# Patient Record
Sex: Female | Born: 1955 | Race: Black or African American | Hispanic: No | Marital: Single | Smoking: Former smoker
Health system: Southern US, Community
[De-identification: ages and names within clinical notes are randomized; demographics above are authoritative.]

## PROBLEM LIST (undated history)

## (undated) DIAGNOSIS — F419 Anxiety disorder, unspecified: Secondary | ICD-10-CM

## (undated) DIAGNOSIS — F329 Major depressive disorder, single episode, unspecified: Secondary | ICD-10-CM

## (undated) DIAGNOSIS — J449 Chronic obstructive pulmonary disease, unspecified: Secondary | ICD-10-CM

## (undated) DIAGNOSIS — M1990 Unspecified osteoarthritis, unspecified site: Secondary | ICD-10-CM

## (undated) DIAGNOSIS — N289 Disorder of kidney and ureter, unspecified: Secondary | ICD-10-CM

## (undated) DIAGNOSIS — IMO0001 Reserved for inherently not codable concepts without codable children: Secondary | ICD-10-CM

## (undated) DIAGNOSIS — G4700 Insomnia, unspecified: Secondary | ICD-10-CM

## (undated) DIAGNOSIS — I719 Aortic aneurysm of unspecified site, without rupture: Secondary | ICD-10-CM

## (undated) DIAGNOSIS — I639 Cerebral infarction, unspecified: Secondary | ICD-10-CM

## (undated) DIAGNOSIS — I10 Essential (primary) hypertension: Secondary | ICD-10-CM

## (undated) DIAGNOSIS — I2781 Cor pulmonale (chronic): Secondary | ICD-10-CM

## (undated) DIAGNOSIS — N189 Chronic kidney disease, unspecified: Secondary | ICD-10-CM

## (undated) DIAGNOSIS — F32A Depression, unspecified: Secondary | ICD-10-CM

## (undated) DIAGNOSIS — E079 Disorder of thyroid, unspecified: Secondary | ICD-10-CM

## (undated) DIAGNOSIS — M109 Gout, unspecified: Secondary | ICD-10-CM

## (undated) DIAGNOSIS — I471 Supraventricular tachycardia, unspecified: Secondary | ICD-10-CM

## (undated) DIAGNOSIS — Q2112 Patent foramen ovale: Secondary | ICD-10-CM

## (undated) DIAGNOSIS — Q211 Atrial septal defect: Secondary | ICD-10-CM

## (undated) HISTORY — DX: Chronic kidney disease, unspecified: N18.9

## (undated) HISTORY — PX: MULTIPLE TOOTH EXTRACTIONS: SHX2053

---

## 1997-10-29 ENCOUNTER — Ambulatory Visit (HOSPITAL_COMMUNITY): Admission: RE | Admit: 1997-10-29 | Discharge: 1997-10-29 | Payer: Self-pay | Admitting: Obstetrics and Gynecology

## 2002-03-11 ENCOUNTER — Emergency Department (HOSPITAL_COMMUNITY): Admission: EM | Admit: 2002-03-11 | Discharge: 2002-03-11 | Payer: Self-pay | Admitting: Emergency Medicine

## 2003-05-21 ENCOUNTER — Emergency Department (HOSPITAL_COMMUNITY): Admission: EM | Admit: 2003-05-21 | Discharge: 2003-05-21 | Payer: Self-pay | Admitting: Emergency Medicine

## 2004-01-06 ENCOUNTER — Emergency Department (HOSPITAL_COMMUNITY): Admission: EM | Admit: 2004-01-06 | Discharge: 2004-01-06 | Payer: Self-pay | Admitting: Emergency Medicine

## 2005-06-04 ENCOUNTER — Emergency Department (HOSPITAL_COMMUNITY): Admission: EM | Admit: 2005-06-04 | Discharge: 2005-06-04 | Payer: Self-pay | Admitting: Emergency Medicine

## 2005-09-12 ENCOUNTER — Emergency Department (HOSPITAL_COMMUNITY): Admission: EM | Admit: 2005-09-12 | Discharge: 2005-09-12 | Payer: Self-pay | Admitting: Emergency Medicine

## 2005-12-21 ENCOUNTER — Emergency Department (HOSPITAL_COMMUNITY): Admission: EM | Admit: 2005-12-21 | Discharge: 2005-12-21 | Payer: Self-pay | Admitting: Family Medicine

## 2007-03-05 ENCOUNTER — Emergency Department (HOSPITAL_COMMUNITY): Admission: EM | Admit: 2007-03-05 | Discharge: 2007-03-06 | Payer: Self-pay | Admitting: Emergency Medicine

## 2007-06-10 ENCOUNTER — Emergency Department (HOSPITAL_COMMUNITY): Admission: EM | Admit: 2007-06-10 | Discharge: 2007-06-10 | Payer: Self-pay | Admitting: Emergency Medicine

## 2009-01-20 ENCOUNTER — Emergency Department (HOSPITAL_COMMUNITY): Admission: EM | Admit: 2009-01-20 | Discharge: 2009-01-20 | Payer: Self-pay | Admitting: Emergency Medicine

## 2009-06-11 ENCOUNTER — Emergency Department (HOSPITAL_COMMUNITY): Admission: EM | Admit: 2009-06-11 | Discharge: 2009-06-11 | Payer: Self-pay | Admitting: Emergency Medicine

## 2010-07-07 LAB — CBC
HCT: 44.6 % (ref 36.0–46.0)
Hemoglobin: 14.7 g/dL (ref 12.0–15.0)
MCHC: 32.9 g/dL (ref 30.0–36.0)
MCV: 84.8 fL (ref 78.0–100.0)
Platelets: 292 10*3/uL (ref 150–400)
RBC: 5.27 MIL/uL — ABNORMAL HIGH (ref 3.87–5.11)
RDW: 17.1 % — ABNORMAL HIGH (ref 11.5–15.5)
WBC: 7.7 10*3/uL (ref 4.0–10.5)

## 2010-07-07 LAB — URINALYSIS, ROUTINE W REFLEX MICROSCOPIC
Glucose, UA: NEGATIVE mg/dL
Ketones, ur: NEGATIVE mg/dL
Leukocytes, UA: NEGATIVE
Nitrite: NEGATIVE
Protein, ur: >300 mg/dL — AB
Specific Gravity, Urine: 1.034 — ABNORMAL HIGH (ref 1.005–1.030)
Urobilinogen, UA: 1 mg/dL (ref 0.0–1.0)
pH: 5 (ref 5.0–8.0)

## 2010-07-07 LAB — BASIC METABOLIC PANEL
BUN: 19 mg/dL (ref 6–23)
CO2: 27 mEq/L (ref 19–32)
Calcium: 9.4 mg/dL (ref 8.4–10.5)
Chloride: 105 mEq/L (ref 96–112)
Creatinine, Ser: 1.68 mg/dL — ABNORMAL HIGH (ref 0.4–1.2)
GFR calc Af Amer: 39 mL/min — ABNORMAL LOW (ref >60)
GFR calc non Af Amer: 32 mL/min — ABNORMAL LOW (ref >60)
Glucose, Bld: 84 mg/dL (ref 70–99)
Potassium: 4.1 mEq/L (ref 3.5–5.1)
Sodium: 141 mEq/L (ref 135–145)

## 2010-07-07 LAB — GLUCOSE, CAPILLARY: Glucose-Capillary: 133 mg/dL — ABNORMAL HIGH (ref 70–99)

## 2010-07-07 LAB — DIFFERENTIAL
Basophils Absolute: 0.1 10*3/uL (ref 0.0–0.1)
Basophils Relative: 1 % (ref 0–1)
Eosinophils Absolute: 0.2 10*3/uL (ref 0.0–0.7)
Eosinophils Relative: 3 % (ref 0–5)
Lymphocytes Relative: 26 % (ref 12–46)
Lymphs Abs: 2 10*3/uL (ref 0.7–4.0)
Monocytes Absolute: 1 10*3/uL (ref 0.1–1.0)
Monocytes Relative: 14 % — ABNORMAL HIGH (ref 3–12)
Neutro Abs: 4.3 10*3/uL (ref 1.7–7.7)
Neutrophils Relative %: 57 % (ref 43–77)

## 2010-07-07 LAB — URINE MICROSCOPIC-ADD ON

## 2010-08-29 ENCOUNTER — Emergency Department (HOSPITAL_COMMUNITY)
Admission: EM | Admit: 2010-08-29 | Discharge: 2010-08-29 | Disposition: A | Discharge status: Home / Self Care | Payer: BC Managed Care – PPO | Attending: Emergency Medicine | Admitting: Emergency Medicine

## 2010-08-29 DIAGNOSIS — G5600 Carpal tunnel syndrome, unspecified upper limb: Secondary | ICD-10-CM | POA: Insufficient documentation

## 2010-08-29 DIAGNOSIS — R209 Unspecified disturbances of skin sensation: Secondary | ICD-10-CM | POA: Insufficient documentation

## 2010-08-29 DIAGNOSIS — I10 Essential (primary) hypertension: Secondary | ICD-10-CM | POA: Insufficient documentation

## 2010-12-26 LAB — CBC
HCT: 42.8
Hemoglobin: 14.5
MCHC: 33.8
MCV: 82.6
Platelets: 339
RBC: 5.19 — ABNORMAL HIGH
RDW: 16.5 — ABNORMAL HIGH
WBC: 8.2

## 2010-12-26 LAB — DIFFERENTIAL
Basophils Absolute: 0
Basophils Relative: 0
Eosinophils Absolute: 0.2
Eosinophils Relative: 2
Lymphocytes Relative: 25
Lymphs Abs: 2.1
Monocytes Absolute: 0.7
Monocytes Relative: 9
Neutro Abs: 5.2
Neutrophils Relative %: 64

## 2010-12-26 LAB — BASIC METABOLIC PANEL
BUN: 11
CO2: 26
Calcium: 9.2
Chloride: 104
Creatinine, Ser: 1.02
GFR calc Af Amer: >60
GFR calc non Af Amer: 57 — ABNORMAL LOW
Glucose, Bld: 113 — ABNORMAL HIGH
Potassium: 4.4
Sodium: 137

## 2011-01-09 LAB — URINE MICROSCOPIC-ADD ON

## 2011-01-09 LAB — URINALYSIS, ROUTINE W REFLEX MICROSCOPIC
Glucose, UA: NEGATIVE
Hgb urine dipstick: NEGATIVE
Ketones, ur: 15 — AB
Nitrite: NEGATIVE
Protein, ur: >300 — AB
Specific Gravity, Urine: 1.026
Urobilinogen, UA: 1
pH: 5

## 2011-01-09 LAB — DIFFERENTIAL
Basophils Absolute: 0
Basophils Relative: 0
Eosinophils Absolute: 0.3
Eosinophils Relative: 4
Lymphocytes Relative: 43
Lymphs Abs: 3.1
Monocytes Absolute: 0.6
Monocytes Relative: 9
Neutro Abs: 3.1
Neutrophils Relative %: 44

## 2011-01-09 LAB — COMPREHENSIVE METABOLIC PANEL
ALT: 12
AST: 17
Albumin: 3.6
Alkaline Phosphatase: 115
BUN: 12
CO2: 21
Calcium: 9.5
Chloride: 107
Creatinine, Ser: 1.21 — ABNORMAL HIGH
GFR calc Af Amer: 57 — ABNORMAL LOW
GFR calc non Af Amer: 47 — ABNORMAL LOW
Glucose, Bld: 114 — ABNORMAL HIGH
Potassium: 3.9
Sodium: 139
Total Bilirubin: 0.6
Total Protein: 7.8

## 2011-01-09 LAB — POCT CARDIAC MARKERS
CKMB, poc: 4.7
Myoglobin, poc: 300
Operator id: 257131
Troponin i, poc: <0.05

## 2011-01-09 LAB — CBC
HCT: 45
Hemoglobin: 14.8
MCHC: 32.9
MCV: 85.8
Platelets: 322
RBC: 5.24 — ABNORMAL HIGH
RDW: 16.1 — ABNORMAL HIGH
WBC: 7.1

## 2011-07-04 ENCOUNTER — Encounter (HOSPITAL_COMMUNITY): Payer: Self-pay | Admitting: *Deleted

## 2011-07-04 ENCOUNTER — Emergency Department (HOSPITAL_COMMUNITY)
Admission: EM | Admit: 2011-07-04 | Discharge: 2011-07-04 | Disposition: A | Discharge status: Home / Self Care | Payer: BC Managed Care – PPO | Attending: Emergency Medicine | Admitting: Emergency Medicine

## 2011-07-04 DIAGNOSIS — R5383 Other fatigue: Secondary | ICD-10-CM | POA: Insufficient documentation

## 2011-07-04 DIAGNOSIS — Z9119 Patient's noncompliance with other medical treatment and regimen: Secondary | ICD-10-CM | POA: Insufficient documentation

## 2011-07-04 DIAGNOSIS — F17200 Nicotine dependence, unspecified, uncomplicated: Secondary | ICD-10-CM | POA: Insufficient documentation

## 2011-07-04 DIAGNOSIS — M7989 Other specified soft tissue disorders: Secondary | ICD-10-CM | POA: Insufficient documentation

## 2011-07-04 DIAGNOSIS — J45909 Unspecified asthma, uncomplicated: Secondary | ICD-10-CM | POA: Insufficient documentation

## 2011-07-04 DIAGNOSIS — Z9114 Patient's other noncompliance with medication regimen: Secondary | ICD-10-CM

## 2011-07-04 DIAGNOSIS — I10 Essential (primary) hypertension: Secondary | ICD-10-CM | POA: Insufficient documentation

## 2011-07-04 DIAGNOSIS — R0602 Shortness of breath: Secondary | ICD-10-CM | POA: Insufficient documentation

## 2011-07-04 DIAGNOSIS — R5381 Other malaise: Secondary | ICD-10-CM | POA: Insufficient documentation

## 2011-07-04 DIAGNOSIS — Z91199 Patient's noncompliance with other medical treatment and regimen due to unspecified reason: Secondary | ICD-10-CM | POA: Insufficient documentation

## 2011-07-04 HISTORY — DX: Essential (primary) hypertension: I10

## 2011-07-04 LAB — BASIC METABOLIC PANEL
BUN: 32 mg/dL — ABNORMAL HIGH (ref 6–23)
CO2: 23 mEq/L (ref 19–32)
Chloride: 105 mEq/L (ref 96–112)
Creatinine, Ser: 2.06 mg/dL — ABNORMAL HIGH (ref 0.50–1.10)
Glucose, Bld: 82 mg/dL (ref 70–99)

## 2011-07-04 LAB — CBC
HCT: 42.7 % (ref 36.0–46.0)
MCH: 27.2 pg (ref 26.0–34.0)
MCV: 84.1 fL (ref 78.0–100.0)
RBC: 5.08 MIL/uL (ref 3.87–5.11)
WBC: 4.9 10*3/uL (ref 4.0–10.5)

## 2011-07-04 MED ORDER — AMLODIPINE BESYLATE 10 MG PO TABS: 10.0000 mg | ORAL_TABLET | Freq: Once | ORAL | Status: DC

## 2011-07-04 MED ORDER — LISINOPRIL 20 MG PO TABS: 20.0000 mg | ORAL_TABLET | Freq: Once | ORAL | Status: DC

## 2011-07-04 MED ORDER — AMLODIPINE BESYLATE 10 MG PO TABS
10.0000 mg | ORAL_TABLET | Freq: Once | ORAL | Status: AC
  Administered 2011-07-04: 10 mg via ORAL
  Filled 2011-07-04: qty 1

## 2011-07-04 MED ORDER — HYDROCHLOROTHIAZIDE 25 MG PO TABS
25.0000 mg | ORAL_TABLET | Freq: Once | ORAL | Status: AC
  Administered 2011-07-04: 25 mg via ORAL
  Filled 2011-07-04: qty 1

## 2011-07-04 MED ORDER — IPRATROPIUM BROMIDE 0.02 % IN SOLN
0.5000 mg | Freq: Once | RESPIRATORY_TRACT | Status: AC
  Administered 2011-07-04: 0.5 mg via RESPIRATORY_TRACT
  Filled 2011-07-04: qty 2.5

## 2011-07-04 MED ORDER — HYDROCHLOROTHIAZIDE 25 MG PO TABS: 25.0000 mg | ORAL_TABLET | Freq: Once | ORAL | Status: DC

## 2011-07-04 MED ORDER — ALBUTEROL SULFATE (5 MG/ML) 0.5% IN NEBU
5.0000 mg | INHALATION_SOLUTION | Freq: Once | RESPIRATORY_TRACT | Status: AC
  Administered 2011-07-04: 5 mg via RESPIRATORY_TRACT
  Filled 2011-07-04: qty 1

## 2011-07-04 MED ORDER — LISINOPRIL 20 MG PO TABS
20.0000 mg | ORAL_TABLET | Freq: Once | ORAL | Status: AC
  Administered 2011-07-04: 20 mg via ORAL
  Filled 2011-07-04: qty 1

## 2011-07-04 NOTE — ED Notes (Signed)
RT called to give breathing treatment.

## 2011-07-04 NOTE — ED Notes (Signed)
Nurse Efraim Kaufmann notified of blood pressure.

## 2011-07-04 NOTE — ED Notes (Signed)
Pt reports has been off of BP medications x couple of months. Reports pain to eyes, denies headache. Reports shortness of breath with exertion and bilateral swelling to hands. Swelling to right elbow. Pt reports feeling "just not right". Denies nausea/vomiting/chest pain. MD Hosmer at bedside.

## 2011-07-04 NOTE — ED Provider Notes (Signed)
3:17 PM Advised close followup with primary care physician. Discussed importance of blood pressure control. Patient voices understanding and is ready for discharge. Blood pressure and swelling have improved slightly since her arrival in emergency department.  Results for orders placed during the hospital encounter of 07/04/11  CBC      Component Value Range   WBC 4.9  4.0 - 10.5 (K/uL)   RBC 5.08  3.87 - 5.11 (MIL/uL)   Hemoglobin 13.8  12.0 - 15.0 (g/dL)   HCT 16.1  09.6 - 04.5 (%)   MCV 84.1  78.0 - 100.0 (fL)   MCH 27.2  26.0 - 34.0 (pg)   MCHC 32.3  30.0 - 36.0 (g/dL)   RDW 40.9 (*) 81.1 - 15.5 (%)   Platelets 313  150 - 400 (K/uL)  BASIC METABOLIC PANEL      Component Value Range   Sodium 141  135 - 145 (mEq/L)   Potassium 4.3  3.5 - 5.1 (mEq/L)   Chloride 105  96 - 112 (mEq/L)   CO2 23  19 - 32 (mEq/L)   Glucose, Bld 82  70 - 99 (mg/dL)   BUN 32 (*) 6 - 23 (mg/dL)   Creatinine, Ser 9.14 (*) 0.50 - 1.10 (mg/dL)   Calcium 9.5  8.4 - 78.2 (mg/dL)   GFR calc non Af Amer 26 (*) >90 (mL/min)   GFR calc Af Amer 30 (*) >90 (mL/min)     Thomasene Lot, PA-C 07/05/11 1959

## 2011-07-04 NOTE — ED Provider Notes (Signed)
History     CSN: [id number]  Arrival date & time 07/04/11  1140   First MD Initiated Contact with Patient 07/04/11 1220      Chief Complaint  Patient presents with  . Hypertension  . Arm Swelling    (Consider location/radiation/quality/duration/timing/severity/associated sxs/prior treatment) HPI Comments: Patient present today with complaints of just not feeling well.  She states she feels more tired than normal.  She has not seen her primary care physician since her primary care physician left the practice several months ago.  Since that time she's not been on her blood pressure medications.  She does not recall specifically what those medications were.  Patient is also noted that she has some swelling in her right distal elbow without cause of trauma or injury.  And she has swelling in both of her hands.  Again with no trauma or injury.  No fevers, redness or warmth.  Patient has no other joint pains in her knees and feet which he takes ibuprofen which does help control her pain.  Family encouraged her to come in for evaluation given the way she's been feeling any concern about her blood pressure.  No chest pain.  Patient does smoke approximately half pack of cigarettes per day and does note some shortness of breath occasionally.  She does not use any inhalers at this time.  Patient is a 56 y.o. female presenting with hypertension. The history is provided by the patient. No language interpreter was used.  Hypertension This is a chronic problem. Associated symptoms include shortness of breath. Pertinent negatives include no chest pain, no abdominal pain and no headaches.    Past Medical History  Diagnosis Date  . Asthma   . Hypertension     History reviewed. No pertinent past surgical history.  History reviewed. No pertinent family history.  History  Substance Use Topics  . Smoking status: Current Everyday Smoker  . Smokeless tobacco: Not on file  . Alcohol Use: Yes    OB  History    Grav Para Term Preterm Abortions TAB SAB Ect Mult Living                  Review of Systems  Constitutional: Positive for fatigue. Negative for fever and chills.  HENT: Negative.   Eyes: Negative.  Negative for discharge and redness.  Respiratory: Positive for shortness of breath. Negative for cough.   Cardiovascular: Negative.  Negative for chest pain.  Gastrointestinal: Negative.  Negative for nausea, vomiting, abdominal pain and diarrhea.  Genitourinary: Negative.  Negative for dysuria and vaginal discharge.  Musculoskeletal: Positive for joint swelling. Negative for back pain.  Skin: Negative.  Negative for color change and rash.  Neurological: Negative.  Negative for syncope and headaches.  Hematological: Negative.  Negative for adenopathy.  Psychiatric/Behavioral: Negative.  Negative for confusion.  All other systems reviewed and are negative.    Allergies  Review of patient's allergies indicates no known allergies.  Home Medications   Current Outpatient Rx  Name Route Sig Dispense Refill  . IBUPROFEN 200 MG PO TABS Oral Take 400-600 mg by mouth every 6 (six) hours as needed. For pain      BP 227/148  Pulse 103  Temp 97.3 F (36.3 C)  Resp 18  SpO2 97%  Physical Exam  Nursing note and vitals reviewed. Constitutional: She is oriented to person, place, and time. She appears well-developed and well-nourished.  Non-toxic appearance. She does not have a sickly appearance.  HENT:  Head:  Normocephalic and atraumatic.  Eyes: Conjunctivae, EOM and lids are normal. Pupils are equal, round, and reactive to light. No scleral icterus.  Neck: Trachea normal and normal range of motion. Neck supple.  Cardiovascular: Normal rate, regular rhythm and normal heart sounds.  Exam reveals no gallop and no friction rub.   No murmur heard. Pulmonary/Chest: Effort normal. No respiratory distress. She has wheezes. She has no rales. She exhibits no tenderness.       Mild  expiratory wheezing.  Abdominal: Soft. Normal appearance. There is no tenderness. There is no rebound, no guarding and no CVA tenderness.  Musculoskeletal: Normal range of motion.       Patient does have swelling in her right elbow that is not erythematous, no warm, nontender.  Patient can flex and extend her elbow without difficulty.  She has palpable radial pulses bilaterally.  Capillary refill less than 2 seconds in both hands.  She does have swelling in both of her hands with the right hand being slightly worse than the left.  Again with no erythema, no warmth and no crepitus.  Neurological: She is alert and oriented to person, place, and time. She has normal strength.  Skin: Skin is warm, dry and intact. No rash noted.  Psychiatric: She has a normal mood and affect. Her behavior is normal. Judgment and thought content normal.    ED Course  Procedures (including critical care time)   Labs Reviewed  CBC  BASIC METABOLIC PANEL   No results found.   No diagnosis found.    MDM  Patient transferred to CDU while awaiting her laboratory studies and receiving her blood pressure medications.  Patient was counseled about the importance of a primary care physician and primary care followup for her blood pressure as well as following her kidney function.        Nat Christen, MD 07/06/11 [phone]

## 2011-07-04 NOTE — ED Notes (Addendum)
Pt states I just don't feel like a human being. Reports is unable to fill her medications due to money. 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 Pt reports she has not taken her blood pressure medication, reports is unable to fill rx. Denies chest pain or sob.

## 2011-07-04 NOTE — ED Notes (Signed)
PT STATES SHE HAS NOT FELT WELL FOR SOME TIME. STATES SHE FEELS TIRED A LOT. STATES SHE HAD BEEN ON BP MEDS BEFORE BUT HAS NOT SEEN A DR IN A WHILE AND HER DR IS NOT PRACTICING IN THE AREA AT THIS TIME. . DENIES CP OR SOB.WILL OBSERVE PT BP FOR IT TO DECREASE. PT AWARE AND AGREEABLE TO PLAN

## 2011-07-04 NOTE — Discharge Instructions (Signed)
Hypertension Information As your heart beats, it forces blood through your arteries. This force is your blood pressure. If the pressure is too high, it is called hypertension (HTN) or high blood pressure. HTN is dangerous because you may have it and not know it. High blood pressure may mean that your heart has to work harder to pump blood. Your arteries may be narrow or stiff. The extra work puts you at risk for heart disease, stroke, and other problems.  Blood pressure consists of two numbers, a higher number over a lower, 110/72, for example. It is stated as "110 over 72." The ideal is below 120 for the top number (systolic) and under 80 for the bottom (diastolic).  You should pay close attention to your blood pressure if you have certain conditions such as:  Heart failure.   Prior heart attack.   Diabetes   Chronic kidney disease.   Prior stroke.   Multiple risk factors for heart disease.  To see if you have HTN, your blood pressure should be measured while you are seated with your arm held at the level of the heart. It should be measured at least twice. A one-time elevated blood pressure reading (especially in the Emergency Department) does not mean that you need treatment. There may be conditions in which the blood pressure is different between your right and left arms. It is important to see your caregiver soon for a recheck. Most people have essential hypertension which means that there is not a specific cause. This type of high blood pressure may be lowered by changing lifestyle factors such as:  Stress.   Smoking.   Lack of exercise.   Excessive weight.   Drug/tobacco/alcohol use.   Eating less salt.  Most people do not have symptoms from high blood pressure until it has caused damage to the body. Effective treatment can often prevent, delay or reduce that damage. TREATMENT  Treatment for high blood pressure, when a cause has been identified, is directed at the cause. There  are a large number of medications to treat HTN. These fall into several categories, and your caregiver will help you select the medicines that are best for you. Medications may have side effects. You should review side effects with your caregiver. If your blood pressure stays high after you have made lifestyle changes or started on medicines,   Your medication(s) may need to be changed.   Other problems may need to be addressed.   Be certain you understand your prescriptions, and know how and when to take your medicine.   Be sure to follow up with your caregiver within the time frame advised (usually within two weeks) to have your blood pressure rechecked and to review your medications.   If you are taking more than one medicine to lower your blood pressure, make sure you know how and at what times they should be taken. Taking two medicines at the same time can result in blood pressure that is too low.  Document Released: 05/23/2005 Document Revised: 11/30/2010 Document Reviewed: 05/30/2007 [employer] Patient Information 2012 [employer]. 

## 2011-07-06 NOTE — ED Provider Notes (Signed)
Medical screening examination/treatment/procedure(s) were conducted as a shared visit with non-physician practitioner(s) and myself.  I personally evaluated the patient during the encounter  See my note with H&P  Nat Christen, MD 07/06/11 [phone]

## 2011-08-27 ENCOUNTER — Encounter (HOSPITAL_COMMUNITY): Payer: Self-pay | Admitting: Family Medicine

## 2011-08-27 ENCOUNTER — Emergency Department (HOSPITAL_COMMUNITY)
Admission: EM | Admit: 2011-08-27 | Discharge: 2011-08-27 | Disposition: A | Discharge status: Home / Self Care | Payer: BC Managed Care – PPO | Attending: Emergency Medicine | Admitting: Emergency Medicine

## 2011-08-27 DIAGNOSIS — I10 Essential (primary) hypertension: Secondary | ICD-10-CM | POA: Insufficient documentation

## 2011-08-27 DIAGNOSIS — J45909 Unspecified asthma, uncomplicated: Secondary | ICD-10-CM | POA: Insufficient documentation

## 2011-08-27 DIAGNOSIS — F17200 Nicotine dependence, unspecified, uncomplicated: Secondary | ICD-10-CM | POA: Insufficient documentation

## 2011-08-27 DIAGNOSIS — Z79899 Other long term (current) drug therapy: Secondary | ICD-10-CM | POA: Insufficient documentation

## 2011-08-27 DIAGNOSIS — Z760 Encounter for issue of repeat prescription: Secondary | ICD-10-CM | POA: Insufficient documentation

## 2011-08-27 MED ORDER — LISINOPRIL 20 MG PO TABS: 20.0000 mg | ORAL_TABLET | Freq: Once | ORAL | Status: DC

## 2011-08-27 MED ORDER — HYDROCHLOROTHIAZIDE 25 MG PO TABS: 25.0000 mg | ORAL_TABLET | Freq: Once | ORAL | Status: DC

## 2011-08-27 MED ORDER — AMLODIPINE BESYLATE 10 MG PO TABS: 10.0000 mg | ORAL_TABLET | Freq: Once | ORAL | Status: DC

## 2011-08-27 NOTE — ED Notes (Signed)
Per pt has been out of BP meds and fluid meds x 1 week.

## 2011-08-27 NOTE — ED Provider Notes (Signed)
History   This chart was scribed for Stephanie Co, MD by Toya Smothers. The patient was seen in room STRE2/STRE2. Patient's care was started at 1338.  CSN: [id number]  Arrival date & time 08/27/11  1338   First MD Initiated Contact with Patient 08/27/11 1508      Chief Complaint  Patient presents with  . Medication Refill   HPI  [name] is a 56 y.o. female who presents to the Emergency Department wanting to refill medication. Pt is currently without a  PCP, and is not showing symptoms denying chest pain, SOB, abdominal pain,, stating that she wanted to refill her medication before it ran out.  Past Medical History  Diagnosis Date  . Asthma   . Hypertension     History reviewed. No pertinent past surgical history.  History reviewed. No pertinent family history.  History  Substance Use Topics  . Smoking status: Current Everyday Smoker  . Smokeless tobacco: Not on file  . Alcohol Use: Yes    Review of Systems  Constitutional: Negative for fever and chills.  Respiratory: Negative for shortness of breath.   Gastrointestinal: Negative for nausea and vomiting.  Neurological: Negative for weakness.  All other systems reviewed and are negative.    Allergies  Review of patient's allergies indicates no known allergies.  Home Medications   Current Outpatient Rx  Name Route Sig Dispense Refill  . AMLODIPINE BESYLATE 10 MG PO TABS Oral Take 1 tablet (10 mg total) by mouth once. 30 tablet 0  . HYDROCHLOROTHIAZIDE 25 MG PO TABS Oral Take 1 tablet (25 mg total) by mouth once. 30 tablet 0  . IBUPROFEN 200 MG PO TABS Oral Take 400-600 mg by mouth every 6 (six) hours as needed. For pain    . LISINOPRIL 20 MG PO TABS Oral Take 1 tablet (20 mg total) by mouth once. 30 tablet 0    BP 168/128  Pulse 108  Temp(Src) 98.6 F (37 C) (Oral)  Resp 21  SpO2 100%  Physical Exam  Nursing note and vitals reviewed. Constitutional: She is oriented to person, place, and time. She  appears well-developed and well-nourished. No distress.  HENT:  Head: Normocephalic and atraumatic.  Eyes: EOM are normal.  Neck: Neck supple. No tracheal deviation present.  Cardiovascular: Normal rate.   Pulmonary/Chest: Effort normal. No respiratory distress.  Musculoskeletal: Normal range of motion.  Neurological: She is alert and oriented to person, place, and time.  Skin: Skin is warm and dry.  Psychiatric: She has a normal mood and affect. Her behavior is normal.    ED Course  Procedures (including critical care time)   COORDINATION OF CARE:    Labs Reviewed - No data to display No results found.   1. Medication refill       MDM  Medication refill.  The patient is without complaints.  She denies chest pain shortness of breath.  She is hypertensive on arrival to the emergency department the blood pressure 160/128.  She's been instructed to fill her blood pressure medications and take them at home.   I personally performed the services described in this documentation, which was scribed in my presence. The recorded information has been reviewed and considered.           Stephanie Co, MD 08/27/11 [phone]

## 2011-08-27 NOTE — Discharge Instructions (Signed)
Medication Refill, Emergency Department  We have refilled your medication today as a courtesy to you. It is best for your medical care, however, to take care of getting refills done through your primary caregiver's office. They have your records and can do a better job of follow-up than we can in the emergency department.  On maintenance medications, we often only prescribe enough medications to get you by until you are able to see your regular caregiver. This is a more expensive way to refill medications.  In the future, please plan for refills so that you will not have to use the emergency department for this.  Thank you for your help. Your help allows us to better take care of the daily emergencies that enter our department.  Document Released: 07/07/2003 Document Revised: 03/09/2011 Document Reviewed: 03/20/2005  [employer] Patient Information 2012 [employer].

## 2012-07-27 ENCOUNTER — Emergency Department (HOSPITAL_COMMUNITY): Payer: BC Managed Care – PPO

## 2012-07-27 ENCOUNTER — Emergency Department (HOSPITAL_COMMUNITY)
Admission: EM | Admit: 2012-07-27 | Discharge: 2012-07-27 | Disposition: A | Discharge status: Home / Self Care | Payer: BC Managed Care – PPO | Attending: Emergency Medicine | Admitting: Emergency Medicine

## 2012-07-27 ENCOUNTER — Encounter (HOSPITAL_COMMUNITY): Payer: Self-pay | Admitting: Emergency Medicine

## 2012-07-27 DIAGNOSIS — Z79899 Other long term (current) drug therapy: Secondary | ICD-10-CM | POA: Insufficient documentation

## 2012-07-27 DIAGNOSIS — I10 Essential (primary) hypertension: Secondary | ICD-10-CM | POA: Insufficient documentation

## 2012-07-27 DIAGNOSIS — M25469 Effusion, unspecified knee: Secondary | ICD-10-CM | POA: Insufficient documentation

## 2012-07-27 DIAGNOSIS — M6580 Other synovitis and tenosynovitis, unspecified site: Secondary | ICD-10-CM | POA: Insufficient documentation

## 2012-07-27 DIAGNOSIS — J45909 Unspecified asthma, uncomplicated: Secondary | ICD-10-CM | POA: Insufficient documentation

## 2012-07-27 DIAGNOSIS — M7652 Patellar tendinitis, left knee: Secondary | ICD-10-CM

## 2012-07-27 DIAGNOSIS — IMO0001 Reserved for inherently not codable concepts without codable children: Secondary | ICD-10-CM | POA: Insufficient documentation

## 2012-07-27 DIAGNOSIS — M7989 Other specified soft tissue disorders: Secondary | ICD-10-CM

## 2012-07-27 DIAGNOSIS — F17200 Nicotine dependence, unspecified, uncomplicated: Secondary | ICD-10-CM | POA: Insufficient documentation

## 2012-07-27 MED ORDER — TRAMADOL HCL 50 MG PO TABS: 50.0000 mg | ORAL_TABLET | Freq: Four times a day (QID) | ORAL | Status: DC | PRN

## 2012-07-27 NOTE — ED Notes (Signed)
Pt reports 1 or 2 year hx of pain and swelling in feet, recurrent swelling in l/knee. Pain in feet and knee when walking Pt stated that she was seen by PCP 1 month ago for same concern

## 2012-07-27 NOTE — ED Provider Notes (Signed)
History     CSN: [id number]  Arrival date & time 07/27/12  1231   First MD Initiated Contact with Patient 07/27/12 1329      Chief Complaint  Patient presents with  . Joint Swelling    l/knee pain and swelling, intermittent  . Foot Pain    recurrent pain swelling in both feet    (Consider location/radiation/quality/duration/timing/severity/associated sxs/prior treatment) HPI [name] is a 57 y.o. female who presents to ED with complaint of pain in left knee. States knee pain started several days ago. States pain with applying pressure and moving knee at the joint. States it is swollen. Denies injuries. States on her feet a lot, but does not remember injuring it. States took ibuprofen. But states she is not supposed to take it bc has kidney problems. States also gets intermittent swelling in ankles and feet bilaterally. States intermittent pain in feet and ankles as well. Denies fever, chills, malaise.  Past Medical History  Diagnosis Date  . Asthma   . Hypertension     History reviewed. No pertinent past surgical history.  Family History  Problem Relation Age of Onset  . Diabetes Mother   . Hypertension Mother     History  Substance Use Topics  . Smoking status: Current Every Day Smoker  . Smokeless tobacco: Not on file  . Alcohol Use: Yes    OB History   Grav Para Term Preterm Abortions TAB SAB Ect Mult Living                  Review of Systems  Constitutional: Negative for fever and chills.  HENT: Negative for neck pain and neck stiffness.   Respiratory: Negative.   Cardiovascular: Positive for leg swelling. Negative for chest pain and palpitations.  Gastrointestinal: Negative for nausea, vomiting and abdominal pain.  Musculoskeletal: Positive for myalgias, joint swelling and arthralgias.  Skin: Negative for color change and wound.  Neurological: Negative for dizziness, weakness, numbness and headaches.    Allergies  Review of patient's allergies  indicates no known allergies.  Home Medications   Current Outpatient Rx  Name  Route  Sig  Dispense  Refill  . amLODipine (NORVASC) 10 MG tablet   Oral   Take 10 mg by mouth daily.         Marland Kitchen ibuprofen (ADVIL,MOTRIN) 200 MG tablet   Oral   Take 400-600 mg by mouth every 6 (six) hours as needed. For pain         . metoprolol tartrate (LOPRESSOR) 25 MG tablet   Oral   Take 25 mg by mouth 2 (two) times daily.           BP 150/99  Pulse 79  Temp(Src) 99 F (37.2 C) (Oral)  SpO2 95%  Physical Exam  Nursing note and vitals reviewed. Constitutional: She is oriented to person, place, and time. She appears well-developed and well-nourished. No distress.  Eyes: Conjunctivae are normal.  Cardiovascular: Normal rate, regular rhythm and normal heart sounds.   Pulmonary/Chest: Effort normal and breath sounds normal. No respiratory distress. She has no wheezes. She has no rales.  Musculoskeletal:  Non pitting edema in bilateral ankles. Normal dorsal pedal pulses. Left knee swelling in the superior patellar area. Pain with flexion and extension of the knee joint. Negative anterior, posterior drawer signs. No valgus or varus. Tender over superior patellar tendon. No redness or warmth to the touch. No calf swelling or tenderness bilaterally. Negative homans' sign.    Neurological:  She is alert and oriented to person, place, and time.  Skin: Skin is warm.    ED Course  Procedures (including critical care time)  Labs Reviewed - No data to display Dg Knee Complete 4 Views Left  07/27/2012  *RADIOLOGY REPORT*  Clinical Data: Pain and swelling in the left knee.  LEFT KNEE - COMPLETE 4+ VIEW  Comparison: No priors.  Findings: Four views of the left knee demonstrate no acute displaced fracture, subluxation or dislocation. Small suprapatellar effusion.  IMPRESSION: 1.  Suprapatellar effusion. 2.  No acute bony abnormality.   Original Report Authenticated By: Trudie Reed, M.D.      1.  Patellar tendonitis, left   2. Swelling of left lower extremity       MDM  Pt with left knee swelling. Tender over superior patella tendon. Tendon is intact. Suprapatellar effusion on x-ray. Possible tendonitis vs bursitis. Will treat with Ice, ultram. Follow up with PCP. No significant swelling on today's foot exam. No signs of DVT. Suspect could be related to pt's known renal insufficiency. Will follow up with pcp.   Filed Vitals:   07/27/12 1321  BP: 150/99  Pulse: 79  Temp: 99 F (37.2 C)  TempSrc: Oral  SpO2: 95%       Lottie Mussel, PA-C 07/27/12 1514

## 2012-07-28 NOTE — ED Provider Notes (Signed)
Medical screening examination/treatment/procedure(s) were performed by non-physician practitioner and as supervising physician I was immediately available for consultation/collaboration.  Merion Grimaldo, MD 07/28/12 0753 

## 2012-09-13 ENCOUNTER — Telehealth: Payer: Self-pay | Admitting: Internal Medicine

## 2012-09-13 NOTE — Telephone Encounter (Signed)
Called pt to schedule np appt.  She has no idea why she needs to come here. I told her to call her Dr to explain the reason. lvm to referring Dr.

## 2012-09-16 ENCOUNTER — Telehealth: Payer: Self-pay | Admitting: Oncology

## 2012-09-16 NOTE — Telephone Encounter (Signed)
C/D 09/16/12 for appt. 10/02/12

## 2012-09-16 NOTE — Telephone Encounter (Signed)
S/W PT IN REF TO NP APPT. 10/02/12@10 :30 REFERRING DR MATTINGLY DX-MONOCLONIAL PROTEIN MAILED NP PACKET

## 2012-09-27 ENCOUNTER — Other Ambulatory Visit: Payer: Self-pay | Admitting: Oncology

## 2012-09-27 DIAGNOSIS — D729 Disorder of white blood cells, unspecified: Secondary | ICD-10-CM

## 2012-10-02 ENCOUNTER — Ambulatory Visit (HOSPITAL_COMMUNITY)
Admission: RE | Admit: 2012-10-02 | Discharge: 2012-10-02 | Disposition: A | Discharge status: Home / Self Care | Payer: BC Managed Care – PPO | Source: Ambulatory Visit | Attending: Oncology | Admitting: Oncology

## 2012-10-02 ENCOUNTER — Other Ambulatory Visit (HOSPITAL_BASED_OUTPATIENT_CLINIC_OR_DEPARTMENT_OTHER): Payer: BC Managed Care – PPO | Admitting: Lab

## 2012-10-02 ENCOUNTER — Encounter: Payer: Self-pay | Admitting: Oncology

## 2012-10-02 ENCOUNTER — Telehealth: Payer: Self-pay | Admitting: Oncology

## 2012-10-02 ENCOUNTER — Ambulatory Visit (HOSPITAL_BASED_OUTPATIENT_CLINIC_OR_DEPARTMENT_OTHER): Payer: BC Managed Care – PPO | Admitting: Oncology

## 2012-10-02 ENCOUNTER — Ambulatory Visit: Payer: BC Managed Care – PPO

## 2012-10-02 VITALS — BP 152/102 | HR 108 | Temp 98.2°F | Resp 20 | Ht 65.0 in | Wt 216.5 lb

## 2012-10-02 DIAGNOSIS — E8809 Other disorders of plasma-protein metabolism, not elsewhere classified: Secondary | ICD-10-CM | POA: Insufficient documentation

## 2012-10-02 DIAGNOSIS — D7289 Other specified disorders of white blood cells: Secondary | ICD-10-CM

## 2012-10-02 DIAGNOSIS — D729 Disorder of white blood cells, unspecified: Secondary | ICD-10-CM

## 2012-10-02 LAB — CBC WITH DIFFERENTIAL/PLATELET
BASO%: 1.5 % (ref 0.0–2.0)
EOS%: 2.9 % (ref 0.0–7.0)
HCT: 40.3 % (ref 34.8–46.6)
LYMPH%: 27.3 % (ref 14.0–49.7)
MCH: 26.8 pg (ref 25.1–34.0)
MCHC: 32.5 g/dL (ref 31.5–36.0)
MCV: 82.6 fL (ref 79.5–101.0)
MONO#: 0.8 10*3/uL (ref 0.1–0.9)
MONO%: 12.2 % (ref 0.0–14.0)
NEUT%: 56.1 % (ref 38.4–76.8)
Platelets: 359 10*3/uL (ref 145–400)

## 2012-10-02 LAB — COMPREHENSIVE METABOLIC PANEL (CC13)
ALT: <6 U/L (ref 0–55)
CO2: 25 mEq/L (ref 22–29)
Creatinine: 2.5 mg/dL — ABNORMAL HIGH (ref 0.6–1.1)
Total Bilirubin: 0.21 mg/dL (ref 0.20–1.20)

## 2012-10-02 NOTE — Progress Notes (Signed)
Reason for Referral: Monoclonal protein.   HPI: [name] is a 57 year old African American woman currently of [place] where she lived the majority of her life. She is a pleasant woman with long-standing history of hypertension as well as arthritis. It appears that she has gout and possible rheumatoid arthritis. It was noted by her primary care physician increased creatinine with a basal ranging between 1.9-2.5. For that reason patient was evaluated at [facility] on 09/03/2012 and laboratory testing was done there. She was noted to have a M spike of 0.3 g/dL and the quantitative immunoglobulins of elevated IgG and IgA was levels of 1823 and 749 respectively. 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 She had a normal CBC with a hemoglobin of 13 her potassium was 5.4 and a creatinine of 2.19. Her albumin was normal at 4.0 so as her calcium. Patient referred to me for an evaluation for a possible plasma cell disorder.  Clinically, she is asymptomatic from this. She does reports pain in her ankle presumably to do gouty arthritis but no other bony pain at this time. She is not reporting any back pain, shoulder pain, hip pain or any neuropathy. She has not reported any recurrent sinopulmonary infections or any opportunistic infections. She's continue to be functional although not working but is looking for a job at this time.   Past Medical History  Diagnosis Date  . Asthma   . Hypertension   :  No surgery is noted.    Current Outpatient Prescriptions  Medication Sig Dispense Refill  . ibuprofen (ADVIL,MOTRIN) 200 MG tablet Take 400-600 mg by mouth every 6 (six) hours as needed. For pain      . metoprolol tartrate (LOPRESSOR) 25 MG tablet Take 25 mg by mouth 2 (two) times daily.      . predniSONE (DELTASONE) 5 MG tablet Take 5 mg by mouth daily.      . traMADol (ULTRAM) 50 MG tablet Take 1 tablet (50 mg total) by mouth every 6 (six)  hours as needed for pain.  15 tablet  0  . amLODipine (NORVASC) 10 MG tablet Take 10 mg by mouth daily.       No current facility-administered medications for this visit.       No Known Allergies:  Family History  Problem Relation Age of Onset  . Diabetes Mother   . Hypertension Mother   :  History   Social History  . Marital Status: Single    Spouse Name: N/A    Number of Children: N/A  . Years of Education: N/A   Occupational History  . Not on file.   Social History Main Topics  . Smoking status: Current Every Day Smoker  . Smokeless tobacco: Not on file  . Alcohol Use: Yes  . Drug Use: Not on file  . Sexually Active: Not on file   Other Topics Concern  . Not on file   Social History Narrative  . No narrative on file  :  A comprehensive review of systems was negative.  Exam: ECOG 0 Blood pressure 152/102, pulse 108, temperature 98.2 F (36.8 C), temperature source Oral, resp. rate 20, height 5\' 5"  (1.651 m), weight 216 lb 8 oz (98.204 kg). General appearance: alert Head: Normocephalic, without obvious abnormality, atraumatic Nose: Nares normal. Septum midline. Mucosa normal. No drainage or sinus tenderness. Throat: lips, mucosa, and tongue normal; teeth and gums normal Neck: no adenopathy, no carotid bruit, no JVD, supple, symmetrical, trachea  midline and thyroid not enlarged, symmetric, no tenderness/mass/nodules Resp: clear to auscultation bilaterally Chest wall: no tenderness Cardio: regular rate and rhythm, S1, S2 normal, no murmur, click, rub or gallop GI: soft, non-tender; bowel sounds normal; no masses,  no organomegaly Extremities: extremities normal, atraumatic, no cyanosis or edema Pulses: 2+ and symmetric Skin: Skin color, texture, turgor normal. No rashes or lesions   Recent Labs  10/02/12 1046  WBC 6.8  HGB 13.1  HCT 40.3  PLT 359   No results found for this basename: NA, K, CL, CO2, GLUCOSE, BUN, CREATININE, CALCIUM,  in the last 72  hours     Assessment and Plan:   57 year old woman with the following issues:  1. 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 There is a kappa light chain restriction based on the immunofixation. The differential diagnosis was discussed today with [name] and her daughter. I would include conditions such as multiple myeloma which I think is less likely, monoclonal gammopathy of undetermined significance, amyloidosis and possibly reactive polyclonal protein to 2 autoimmune arthritis. I don't see clear cut end organ damage at this point her renal insufficiency could be explained by long-standing hypertension rather than myeloma kidney at least for the time being. To complete the workup I will obtain a repeat serum protein electrophoresis I will obtain serum light chains for better quantification also will obtain a skeletal survey. I will also discuss with her the possibility she might need a bone marrow biopsy depending on these findings. Again at this point I feel she is asymptomatic I think her renal function or less likely due to plasma cells. I will ask her to come back in 3 weeks to discuss these results after have completed the workup.  2. Chronically insufficiency: Followed by [facility] Dr. Briant Cedar and believed to be related to long-standing hypertension other than plasma cell disorder but will continue to monitor that as well.

## 2012-10-02 NOTE — Progress Notes (Signed)
Checked in new pt with no financial concerns. °

## 2012-10-02 NOTE — Telephone Encounter (Signed)
Pt sent to [facility] rad for bone survey - s/w rad. Pt given appt schedule for July.

## 2012-10-03 ENCOUNTER — Telehealth: Payer: Self-pay | Admitting: *Deleted

## 2012-10-03 NOTE — Telephone Encounter (Signed)
Message copied by Reesa Chew on Thu Oct 03, 2012 11:34 AM ------      Message from: Benjiman Core      Created: Wed Oct 02, 2012  4:44 PM       Please call her with the results. All normal. ------

## 2012-10-03 NOTE — Telephone Encounter (Signed)
Spoke with daughter, let her know bone survey was normal.

## 2012-10-07 ENCOUNTER — Encounter: Payer: Self-pay | Admitting: *Deleted

## 2012-10-07 LAB — SPEP & IFE WITH QIG
Alpha-2-Globulin: 12.3 % — ABNORMAL HIGH (ref 7.1–11.8)
IgG (Immunoglobin G), Serum: 1450 mg/dL (ref 690–1700)
Total Protein, Serum Electrophoresis: 7.7 g/dL (ref 6.0–8.3)

## 2012-10-07 LAB — KAPPA/LAMBDA LIGHT CHAINS
Kappa free light chain: 8.07 mg/dL — ABNORMAL HIGH (ref 0.33–1.94)
Lambda Free Lght Chn: 6.92 mg/dL — ABNORMAL HIGH (ref 0.57–2.63)

## 2012-10-07 NOTE — Progress Notes (Addendum)
CSW received referral from [place], [place], for emotional support.  CSW attempted to contact patient, unable to reach patient by phone.  CSW will attempt to follow up at another time.  UPDATE 1530: Patient returned CSW phone call and shared she is feeling overwhelmed since recently losing her job.  CSW and patient discussed ways of coping with feeling overwhelmed.  CSW encouraged patient to utilize [facility] counseling services.  CSW also provided patient with information on [employer] which has programs to help with employment, legal issues, resources, etc.   Kathrin Penner, MSW, LCSW Clinical Social Worker [facility] [phone]

## 2012-10-23 ENCOUNTER — Ambulatory Visit: Payer: BC Managed Care – PPO | Admitting: Oncology

## 2013-02-21 ENCOUNTER — Emergency Department (HOSPITAL_COMMUNITY): Payer: Self-pay

## 2013-02-21 ENCOUNTER — Emergency Department (HOSPITAL_COMMUNITY)
Admission: EM | Admit: 2013-02-21 | Discharge: 2013-02-21 | Disposition: A | Discharge status: Home / Self Care | Payer: Self-pay | Attending: Emergency Medicine | Admitting: Emergency Medicine

## 2013-02-21 ENCOUNTER — Encounter (HOSPITAL_COMMUNITY): Payer: Self-pay | Admitting: Emergency Medicine

## 2013-02-21 DIAGNOSIS — M129 Arthropathy, unspecified: Secondary | ICD-10-CM | POA: Insufficient documentation

## 2013-02-21 DIAGNOSIS — Z79899 Other long term (current) drug therapy: Secondary | ICD-10-CM | POA: Insufficient documentation

## 2013-02-21 DIAGNOSIS — J45909 Unspecified asthma, uncomplicated: Secondary | ICD-10-CM | POA: Insufficient documentation

## 2013-02-21 DIAGNOSIS — Z7982 Long term (current) use of aspirin: Secondary | ICD-10-CM | POA: Insufficient documentation

## 2013-02-21 DIAGNOSIS — G4700 Insomnia, unspecified: Secondary | ICD-10-CM | POA: Insufficient documentation

## 2013-02-21 DIAGNOSIS — M79609 Pain in unspecified limb: Secondary | ICD-10-CM | POA: Insufficient documentation

## 2013-02-21 DIAGNOSIS — Z8639 Personal history of other endocrine, nutritional and metabolic disease: Secondary | ICD-10-CM | POA: Insufficient documentation

## 2013-02-21 DIAGNOSIS — I10 Essential (primary) hypertension: Secondary | ICD-10-CM | POA: Insufficient documentation

## 2013-02-21 DIAGNOSIS — F17200 Nicotine dependence, unspecified, uncomplicated: Secondary | ICD-10-CM | POA: Insufficient documentation

## 2013-02-21 DIAGNOSIS — Z862 Personal history of diseases of the blood and blood-forming organs and certain disorders involving the immune mechanism: Secondary | ICD-10-CM | POA: Insufficient documentation

## 2013-02-21 HISTORY — DX: Insomnia, unspecified: G47.00

## 2013-02-21 HISTORY — DX: Gout, unspecified: M10.9

## 2013-02-21 HISTORY — DX: Unspecified osteoarthritis, unspecified site: M19.90

## 2013-02-21 LAB — POCT I-STAT, CHEM 8
Calcium, Ion: 1.23 mmol/L (ref 1.12–1.23)
Glucose, Bld: 76 mg/dL (ref 70–99)
HCT: 42 % (ref 36.0–46.0)
Hemoglobin: 14.3 g/dL (ref 12.0–15.0)
Potassium: 4.2 mEq/L (ref 3.5–5.1)

## 2013-02-21 NOTE — ED Notes (Signed)
Pt sent here from [facility].  Pt having high blood pressure, nose bleeds and sore hands. Pt has PMH gout and arthritis. Pt states that she has had nose bleeds everyday this week but today. Pt BP at [facility] was 173/134, 160/135 and 152/120. Pt was given clonidine 0.1mg  in office with BP down to 150/120 dose repeated with BP 160/135, pt then advised to come to ED for further eval and mgt.

## 2013-02-21 NOTE — ED Provider Notes (Signed)
CSN: [id number]     Arrival date & time 02/21/13  1835 History   First MD Initiated Contact with Patient 02/21/13 2012     Chief Complaint  Patient presents with  . Hypertension  . sore hands    (Consider location/radiation/quality/duration/timing/severity/associated sxs/prior Treatment) HPI Comments: Patient presents emergency department with chief complaint of hypertension. She was seen by her family doctor today, and was found to be hypertensive to 173/134. She was sent to the emergency department for further evaluation. She denies any headache, dizziness, chest pain, shortness of breath, nausea, vomiting, diarrhea, or constipation. She states that she is a little sore from where her gout is acting up, but is otherwise in no apparent distress. She states that her blood pressure is up because she has been running around busy all day today. She takes clonidine for her blood pressure as well as losartan/hydrochlorothiazide.  The history is provided by the patient. No language interpreter was used.    Past Medical History  Diagnosis Date  . Asthma   . Hypertension   . Gout   . Arthritis   . Insomnia    History reviewed. No pertinent past surgical history. Family History  Problem Relation Age of Onset  . Diabetes Mother   . Hypertension Mother    History  Substance Use Topics  . Smoking status: Current Every Day Smoker    Types: Cigarettes  . Smokeless tobacco: Not on file  . Alcohol Use: Yes   OB History   Grav Para Term Preterm Abortions TAB SAB Ect Mult Living                 Review of Systems  All other systems reviewed and are negative.    Allergies  Review of patient's allergies indicates no known allergies.  Home Medications   Current Outpatient Rx  Name  Route  Sig  Dispense  Refill  . aspirin 75 MG chewable tablet   Oral   Chew 75 mg by mouth daily.         Marland Kitchen ibuprofen (ADVIL,MOTRIN) 200 MG tablet   Oral   Take 400-600 mg by mouth every 6 (six)  hours as needed. For pain         . losartan-hydrochlorothiazide (HYZAAR) 100-25 MG per tablet   Oral   Take 1 tablet by mouth daily.         . traZODone (DESYREL) 50 MG tablet   Oral   Take 50 mg by mouth at bedtime as needed for sleep (sleep).         . triamcinolone cream (KENALOG) 0.1 %   Topical   Apply 1 application topically 2 (two) times daily.         Marland Kitchen EXPIRED: amLODipine (NORVASC) 10 MG tablet   Oral   Take 10 mg by mouth daily.          BP 144/99  Pulse 100  Temp(Src) 98.2 F (36.8 C) (Oral)  Resp 20  SpO2 93% Physical Exam  Nursing note and vitals reviewed. Constitutional: She is oriented to person, place, and time. She appears well-developed and well-nourished.  HENT:  Head: Normocephalic and atraumatic.  Eyes: Conjunctivae and EOM are normal. Pupils are equal, round, and reactive to light.  Neck: Normal range of motion. Neck supple.  Cardiovascular: Normal rate and regular rhythm.  Exam reveals no gallop and no friction rub.   No murmur heard. Pulmonary/Chest: Effort normal and breath sounds normal. No respiratory distress. She has no  wheezes. She has no rales. She exhibits no tenderness.  Abdominal: Soft. Bowel sounds are normal. She exhibits no distension and no mass. There is no tenderness. There is no rebound and no guarding.  Musculoskeletal: Normal range of motion. She exhibits no edema and no tenderness.  Neurological: She is alert and oriented to person, place, and time.  Skin: Skin is warm and dry.  Psychiatric: She has a normal mood and affect. Her behavior is normal. Judgment and thought content normal.    ED Course  Procedures (including critical care time) Results for orders placed during the hospital encounter of 02/21/13  POCT I-STAT, CHEM 8      Result Value Range   Sodium 140  135 - 145 mEq/L   Potassium 4.2  3.5 - 5.1 mEq/L   Chloride 108  96 - 112 mEq/L   BUN 37 (*) 6 - 23 mg/dL   Creatinine, Ser 1.61 (*) 0.50 - 1.10 mg/dL    Glucose, Bld 76  70 - 99 mg/dL   Calcium, Ion 0.96  0.45 - 1.23 mmol/L   TCO2 22  0 - 100 mmol/L   Hemoglobin 14.3  12.0 - 15.0 g/dL   HCT 40.9  81.1 - 91.4 %   Dg Chest 2 View  02/21/2013   CLINICAL DATA:  Uncontrolled high blood pressure  EXAM: CHEST  2 VIEW  COMPARISON:  [facility] 16, 2014  FINDINGS: The heart size and mediastinal contours are stable. There is mild cardiomegaly. Both lungs are clear. The visualized skeletal structures are stable.  IMPRESSION: No active cardiopulmonary disease.   Electronically Signed   By: Sherian Rein M.D.   On: 02/21/2013 21:18    Imaging Review Dg Chest 2 View  02/21/2013   CLINICAL DATA:  Uncontrolled high blood pressure  EXAM: CHEST  2 VIEW  COMPARISON:  [facility]tha 16, 2014  FINDINGS: The heart size and mediastinal contours are stable. There is mild cardiomegaly. Both lungs are clear. The visualized skeletal structures are stable.  IMPRESSION: No active cardiopulmonary disease.   Electronically Signed   By: Sherian Rein M.D.   On: 02/21/2013 21:18    EKG Interpretation   None       MDM   1. Hypertension      10:05 PM Patient discussed with Dr. Effie Shy.  No further intervention at this time.  PCP follow-up.  Patient is asymptomatic.  No headaches, dizziness, chest pain, or vision changes. No signs of end organ damage. Does have slight bump in Cr which was discussed with Dr. Effie Shy, recommend f/u.  Patient is stable and ready for discharge.   Filed Vitals:   02/21/13 2143  BP: 143/100  Pulse:   Temp:   Resp:      Roxy Horseman, PA-C 02/21/13 2325

## 2013-02-22 NOTE — ED Provider Notes (Signed)
Medical screening examination/treatment/procedure(s) were performed by non-physician practitioner and as supervising physician I was immediately available for consultation/collaboration.  EKG Interpretation   None        Flint Melter, MD 02/22/13 [phone]

## 2013-04-18 ENCOUNTER — Emergency Department (HOSPITAL_COMMUNITY): Payer: No Typology Code available for payment source

## 2013-04-18 ENCOUNTER — Emergency Department (HOSPITAL_COMMUNITY)
Admission: EM | Admit: 2013-04-18 | Discharge: 2013-04-18 | Disposition: A | Discharge status: Home / Self Care | Payer: No Typology Code available for payment source | Attending: Emergency Medicine | Admitting: Emergency Medicine

## 2013-04-18 ENCOUNTER — Encounter (HOSPITAL_COMMUNITY): Payer: Self-pay | Admitting: Emergency Medicine

## 2013-04-18 DIAGNOSIS — G479 Sleep disorder, unspecified: Secondary | ICD-10-CM | POA: Insufficient documentation

## 2013-04-18 DIAGNOSIS — M19039 Primary osteoarthritis, unspecified wrist: Secondary | ICD-10-CM | POA: Insufficient documentation

## 2013-04-18 DIAGNOSIS — IMO0002 Reserved for concepts with insufficient information to code with codable children: Secondary | ICD-10-CM | POA: Insufficient documentation

## 2013-04-18 DIAGNOSIS — F17200 Nicotine dependence, unspecified, uncomplicated: Secondary | ICD-10-CM | POA: Insufficient documentation

## 2013-04-18 DIAGNOSIS — R21 Rash and other nonspecific skin eruption: Secondary | ICD-10-CM | POA: Insufficient documentation

## 2013-04-18 DIAGNOSIS — M19049 Primary osteoarthritis, unspecified hand: Secondary | ICD-10-CM | POA: Insufficient documentation

## 2013-04-18 DIAGNOSIS — Z79899 Other long term (current) drug therapy: Secondary | ICD-10-CM | POA: Insufficient documentation

## 2013-04-18 DIAGNOSIS — Z862 Personal history of diseases of the blood and blood-forming organs and certain disorders involving the immune mechanism: Secondary | ICD-10-CM | POA: Insufficient documentation

## 2013-04-18 DIAGNOSIS — I10 Essential (primary) hypertension: Secondary | ICD-10-CM | POA: Insufficient documentation

## 2013-04-18 DIAGNOSIS — Z8639 Personal history of other endocrine, nutritional and metabolic disease: Secondary | ICD-10-CM | POA: Insufficient documentation

## 2013-04-18 DIAGNOSIS — F39 Unspecified mood [affective] disorder: Secondary | ICD-10-CM | POA: Insufficient documentation

## 2013-04-18 DIAGNOSIS — Z7982 Long term (current) use of aspirin: Secondary | ICD-10-CM | POA: Insufficient documentation

## 2013-04-18 DIAGNOSIS — J45909 Unspecified asthma, uncomplicated: Secondary | ICD-10-CM | POA: Insufficient documentation

## 2013-04-18 LAB — CBC WITH DIFFERENTIAL/PLATELET
Basophils Absolute: 0 10*3/uL (ref 0.0–0.1)
Basophils Relative: 0 % (ref 0–1)
EOS ABS: 0.3 10*3/uL (ref 0.0–0.7)
Eosinophils Relative: 5 % (ref 0–5)
HEMATOCRIT: 40 % (ref 36.0–46.0)
Hemoglobin: 12.8 g/dL (ref 12.0–15.0)
LYMPHS PCT: 33 % (ref 12–46)
Lymphs Abs: 1.8 10*3/uL (ref 0.7–4.0)
MCH: 27.5 pg (ref 26.0–34.0)
MCHC: 32 g/dL (ref 30.0–36.0)
MCV: 86 fL (ref 78.0–100.0)
MONO ABS: 0.6 10*3/uL (ref 0.1–1.0)
Monocytes Relative: 11 % (ref 3–12)
Neutro Abs: 2.7 10*3/uL (ref 1.7–7.7)
Neutrophils Relative %: 50 % (ref 43–77)
PLATELETS: 340 10*3/uL (ref 150–400)
RBC: 4.65 MIL/uL (ref 3.87–5.11)
RDW: 16.4 % — AB (ref 11.5–15.5)
WBC: 5.5 10*3/uL (ref 4.0–10.5)

## 2013-04-18 LAB — TROPONIN I: Troponin I: <0.3 ng/mL (ref <0.30)

## 2013-04-18 LAB — BASIC METABOLIC PANEL
BUN: 33 mg/dL — AB (ref 6–23)
CHLORIDE: 103 meq/L (ref 96–112)
CO2: 20 meq/L (ref 19–32)
Calcium: 9.3 mg/dL (ref 8.4–10.5)
Creatinine, Ser: 2.64 mg/dL — ABNORMAL HIGH (ref 0.50–1.10)
GFR calc Af Amer: 22 mL/min — ABNORMAL LOW (ref >90)
GFR calc non Af Amer: 19 mL/min — ABNORMAL LOW (ref >90)
GLUCOSE: 85 mg/dL (ref 70–99)
POTASSIUM: 4.8 meq/L (ref 3.7–5.3)
SODIUM: 139 meq/L (ref 137–147)

## 2013-04-18 MED ORDER — PREDNISONE 20 MG PO TABS
60.0000 mg | ORAL_TABLET | Freq: Once | ORAL | Status: AC
  Administered 2013-04-18: 60 mg via ORAL
  Filled 2013-04-18: qty 3

## 2013-04-18 MED ORDER — PREDNISONE 20 MG PO TABS: 60.0000 mg | ORAL_TABLET | Freq: Every day | ORAL | Status: DC

## 2013-04-18 NOTE — ED Notes (Addendum)
[facility] with HTN, weakness and left arm numbness X 1 week. B?P at facility was 240/150. Pt was given 100 mg of labetol po at facility. Pt states ROM is baseline. Nuero intact

## 2013-04-18 NOTE — ED Provider Notes (Signed)
CSN: [id number]     Arrival date & time 04/18/13  1840 History   First MD Initiated Contact with Patient 04/18/13 2003     Chief Complaint  Patient presents with  . Hypertension   (Consider location/radiation/quality/duration/timing/severity/associated sxs/prior Treatment) Patient is a 58 y.o. female presenting with hypertension. The history is provided by the patient and a relative. No language interpreter was used.  Hypertension Pertinent negatives include no abdominal pain, chest pain, chills, fever or vomiting. Associated symptoms comments: The patient presents from [facility] with significant hypertension found on clinic visit this afternoon. The patient also complains of left arm soreness but reports that she has chronic similar symptoms secondary to arthritis in the left arm and hand. She denies chest pain, shortness of breath, nausea. When asked if she was taking her medications as prescribed for blood pressure, she states she has been depressed lately and "I don't want to do anything for myself" so she doesn't take her medications daily. .    Past Medical History  Diagnosis Date  . Asthma   . Hypertension   . Gout   . Arthritis   . Insomnia    History reviewed. No pertinent past surgical history. Family History  Problem Relation Age of Onset  . Diabetes Mother   . Hypertension Mother    History  Substance Use Topics  . Smoking status: Current Every Day Smoker    Types: Cigarettes  . Smokeless tobacco: Not on file  . Alcohol Use: Yes   OB History   Grav Para Term Preterm Abortions TAB SAB Ect Mult Living                 Review of Systems  Constitutional: Negative for fever and chills.  HENT: Negative.   Respiratory: Negative.  Negative for shortness of breath.   Cardiovascular: Negative.  Negative for chest pain.  Gastrointestinal: Negative.  Negative for vomiting and abdominal pain.  Musculoskeletal:       See HPI.  Skin: Negative.    Neurological: Negative.   Psychiatric/Behavioral: Positive for dysphoric mood. Negative for suicidal ideas.    Allergies  Review of patient's allergies indicates no known allergies.  Home Medications   Current Outpatient Rx  Name  Route  Sig  Dispense  Refill  . aspirin EC 81 MG tablet   Oral   Take 81 mg by mouth daily.         Marland Kitchen. losartan-hydrochlorothiazide (HYZAAR) 100-25 MG per tablet   Oral   Take 1 tablet by mouth daily.         . traZODone (DESYREL) 50 MG tablet   Oral   Take 50 mg by mouth at bedtime as needed for sleep (sleep).         . triamcinolone cream (KENALOG) 0.1 %   Topical   Apply 1 application topically 2 (two) times daily.         Marland Kitchen. EXPIRED: amLODipine (NORVASC) 10 MG tablet   Oral   Take 10 mg by mouth daily.          BP 159/104  Pulse 88  Temp(Src) 99.2 F (37.3 C) (Oral)  Resp 24  SpO2 98% Physical Exam  Constitutional: She is oriented to person, place, and time. She appears well-developed and well-nourished.  HENT:  Head: Normocephalic.  Neck: Normal range of motion. Neck supple.  Cardiovascular: Normal rate and regular rhythm.   Pulmonary/Chest: Effort normal and breath sounds normal.  Abdominal: Soft. Bowel sounds  are normal. There is no tenderness. There is no rebound and no guarding.  Musculoskeletal: Normal range of motion. She exhibits no edema.  Neurological: She is alert and oriented to person, place, and time.  Skin: Skin is warm and dry. No rash noted.  Psychiatric: She has a normal mood and affect.    ED Course  Procedures (including critical care time) Labs Review Labs Reviewed  BASIC METABOLIC PANEL - Abnormal; Notable for the following:    BUN 33 (*)    Creatinine, Ser 2.64 (*)    GFR calc non Af Amer 19 (*)    GFR calc Af Amer 22 (*)    All other components within normal limits  CBC WITH DIFFERENTIAL - Abnormal; Notable for the following:    RDW 16.4 (*)    All other components within normal limits   TROPONIN I   Results for orders placed during the hospital encounter of 04/18/13  BASIC METABOLIC PANEL      Result Value Range   Sodium 139  137 - 147 mEq/L   Potassium 4.8  3.7 - 5.3 mEq/L   Chloride 103  96 - 112 mEq/L   CO2 20  19 - 32 mEq/L   Glucose, Bld 85  70 - 99 mg/dL   BUN 33 (*) 6 - 23 mg/dL   Creatinine, Ser 1.61 (*) 0.50 - 1.10 mg/dL   Calcium 9.3  8.4 - 09.6 mg/dL   GFR calc non Af Amer 19 (*) >90 mL/min   GFR calc Af Amer 22 (*) >90 mL/min  TROPONIN I      Result Value Range   Troponin I <0.30  <0.30 ng/mL  CBC WITH DIFFERENTIAL      Result Value Range   WBC 5.5  4.0 - 10.5 K/uL   RBC 4.65  3.87 - 5.11 MIL/uL   Hemoglobin 12.8  12.0 - 15.0 g/dL   HCT 04.5  40.9 - 81.1 %   MCV 86.0  78.0 - 100.0 fL   MCH 27.5  26.0 - 34.0 pg   MCHC 32.0  30.0 - 36.0 g/dL   RDW 91.4 (*) 78.2 - 95.6 %   Platelets 340  150 - 400 K/uL   Neutrophils Relative % 50  43 - 77 %   Neutro Abs 2.7  1.7 - 7.7 K/uL   Lymphocytes Relative 33  12 - 46 %   Lymphs Abs 1.8  0.7 - 4.0 K/uL   Monocytes Relative 11  3 - 12 %   Monocytes Absolute 0.6  0.1 - 1.0 K/uL   Eosinophils Relative 5  0 - 5 %   Eosinophils Absolute 0.3  0.0 - 0.7 K/uL   Basophils Relative 0  0 - 1 %   Basophils Absolute 0.0  0.0 - 0.1 K/uL    Imaging Review Dg Chest Portable 1 View  04/18/2013   CLINICAL DATA:  Blurry vision.  Hypertension.  EXAM: PORTABLE CHEST - 1 VIEW  COMPARISON:  Chest x-ray 02/21/2013.  FINDINGS: Lung volumes are normal. No consolidative airspace disease. No pleural effusions. No evidence of pulmonary edema. Heart size is borderline enlarged. Upper mediastinal contours are within normal limits.  IMPRESSION: 1. No radiographic evidence of acute cardiopulmonary disease.   Electronically Signed   By: Trudie Reed M.D.   On: 04/18/2013 21:29    EKG Interpretation   None       MDM  No diagnosis found. 1. Hypertension  The patient is not having chest pain,  SOB or nausea. She reports her  left arm symptoms are per her usual longstanding symptoms of diagnosed arthritis. Family agrees. Blood pressure improved after Labetolol given prior to arrival here. Labs (Cr at baseline), x-ray, EKG reassuring. Feel she is stable for discharge.   She asks about a facial rash that burns and itches, that comes and goes and that causes burning to eyes bilaterally. No fever, purulent drainage from eyes. Exam shows a dry, scaling rash surrounding both eyes, including lids. No redness. She reports a cream given by her doctor is not effective but does not know the name of the cream. Suggested a short course of oral steroid and follow up with primary care physician.  Arnoldo Hooker, PA-C 04/18/13 2307  Arnoldo Hooker, PA-C 04/18/13 2309

## 2013-04-18 NOTE — ED Notes (Signed)
PA at [facility].  

## 2013-04-18 NOTE — Discharge Instructions (Signed)
IT IS VERY IMPORTANT FOR YOU TO TAKE YOUR MEDICATIONS AS PRESCRIBED FOR YOUR BLOOD PRESSURE.    Arterial Hypertension Arterial hypertension (high blood pressure) is a condition of elevated pressure in your blood vessels. Hypertension over a long period of time is a risk factor for strokes, heart attacks, and heart failure. It is also the leading cause of kidney (renal) failure.  CAUSES   In Adults -- Over 90% of all hypertension has no known cause. This is called essential or primary hypertension. In the other 10% of people with hypertension, the increase in blood pressure is caused by another disorder. This is called secondary hypertension. Important causes of secondary hypertension are:  Heavy alcohol use.  Obstructive sleep apnea.  Hyperaldosterosim (Conn's syndrome).  Steroid use.  Chronic kidney failure.  Hyperparathyroidism.  Medications.  Renal artery stenosis.  Pheochromocytoma.  Cushing's disease.  Coarctation of the aorta.  Scleroderma renal crisis.  Licorice (in excessive amounts).  Drugs (cocaine, methamphetamine). Your caregiver can explain any items above that apply to you.  In Children -- Secondary hypertension is more common and should always be considered.  Pregnancy -- Few women of childbearing age have high blood pressure. However, up to 10% of them develop hypertension of pregnancy. Generally, this will not harm the woman. It may be a sign of 3 complications of pregnancy: preeclampsia, HELLP syndrome, and eclampsia. Follow up and control with medication is necessary. SYMPTOMS   This condition normally does not produce any noticeable symptoms. It is usually found during a routine exam.  Malignant hypertension is a late problem of high blood pressure. It may have the following symptoms:  Headaches.  Blurred vision.  End-organ damage (this means your kidneys, heart, lungs, and other organs are being damaged).  Stressful situations can increase the  blood pressure. If a person with normal blood pressure has their blood pressure go up while being seen by their caregiver, this is often termed "white coat hypertension." Its importance is not known. It may be related with eventually developing hypertension or complications of hypertension.  Hypertension is often confused with mental tension, stress, and anxiety. DIAGNOSIS  The diagnosis is made by 3 separate blood pressure measurements. They are taken at least 1 week apart from each other. If there is organ damage from hypertension, the diagnosis may be made without repeat measurements. Hypertension is usually identified by having blood pressure readings:  Above 140/90 mmHg measured in both arms, at 3 separate times, over a couple weeks.  Over 130/80 mmHg should be considered a risk factor and may require treatment in patients with diabetes. Blood pressure readings over 120/80 mmHg are called "pre-hypertension" even in non-diabetic patients. To get a true blood pressure measurement, use the following guidelines. Be aware of the factors that can alter blood pressure readings.  Take measurements at least 1 hour after caffeine.  Take measurements 30 minutes after smoking and without any stress. This is another reason to quit smoking  it raises your blood pressure.  Use a proper cuff size. Ask your caregiver if you are not sure about your cuff size.  Most home blood pressure cuffs are automatic. They will measure systolic and diastolic pressures. The systolic pressure is the pressure reading at the start of sounds. Diastolic pressure is the pressure at which the sounds disappear. If you are elderly, measure pressures in multiple postures. Try sitting, lying or standing.  Sit at rest for a minimum of 5 minutes before taking measurements.  You should not be on  any medications like decongestants. These are found in many cold medications.  Record your blood pressure readings and review them with  your caregiver. If you have hypertension:  Your caregiver may do tests to be sure you do not have secondary hypertension (see "causes" above).  Your caregiver may also look for signs of metabolic syndrome. This is also called Syndrome X or Insulin Resistance Syndrome. You may have this syndrome if you have type 2 diabetes, abdominal obesity, and abnormal blood lipids in addition to hypertension.  Your caregiver will take your medical and family history and perform a physical exam.  Diagnostic tests may include blood tests (for glucose, cholesterol, potassium, and kidney function), a urinalysis, or an EKG. Other tests may also be necessary depending on your condition. PREVENTION  There are important lifestyle issues that you can adopt to reduce your chance of developing hypertension:  Maintain a normal weight.  Limit the amount of salt (sodium) in your diet.  Exercise often.  Limit alcohol intake.  Get enough potassium in your diet. Discuss specific advice with your caregiver.  Follow a DASH diet (dietary approaches to stop hypertension). This diet is rich in fruits, vegetables, and low-fat dairy products, and avoids certain fats. PROGNOSIS  Essential hypertension cannot be cured. Lifestyle changes and medical treatment can lower blood pressure and reduce complications. The prognosis of secondary hypertension depends on the underlying cause. Many people whose hypertension is controlled with medicine or lifestyle changes can live a normal, healthy life.  RISKS AND COMPLICATIONS  While high blood pressure alone is not an illness, it often requires treatment due to its short- and long-term effects on many organs. Hypertension increases your risk for:  CVAs or strokes (cerebrovascular accident).  Heart failure due to chronically high blood pressure (hypertensive cardiomyopathy).  Heart attack (myocardial infarction).  Damage to the retina (hypertensive retinopathy).  Kidney failure  (hypertensive nephropathy). Your caregiver can explain list items above that apply to you. Treatment of hypertension can significantly reduce the risk of complications. TREATMENT   For overweight patients, weight loss and regular exercise are recommended. Physical fitness lowers blood pressure.  Mild hypertension is usually treated with diet and exercise. A diet rich in fruits and vegetables, fat-free dairy products, and foods low in fat and salt (sodium) can help lower blood pressure. Decreasing salt intake decreases blood pressure in a 1/3 of people.  Stop smoking if you are a smoker. The steps above are highly effective in reducing blood pressure. While these actions are easy to suggest, they are difficult to achieve. Most patients with moderate or severe hypertension end up requiring medications to bring their blood pressure down to a normal level. There are several classes of medications for treatment. Blood pressure pills (antihypertensives) will lower blood pressure by their different actions. Lowering the blood pressure by 10 mmHg may decrease the risk of complications by as much as 25%. The goal of treatment is effective blood pressure control. This will reduce your risk for complications. Your caregiver will help you determine the best treatment for you according to your lifestyle. What is excellent treatment for one person, may not be for you. HOME CARE INSTRUCTIONS   Do not smoke.  Follow the lifestyle changes outlined in the "Prevention" section.  If you are on medications, follow the directions carefully. Blood pressure medications must be taken as prescribed. Skipping doses reduces their benefit. It also puts you at risk for problems.  Follow up with your caregiver, as directed.  If you are  asked to monitor your blood pressure at home, follow the guidelines in the "Diagnosis" section above. SEEK MEDICAL CARE IF:   You think you are having medication side effects.  You have  recurrent headaches or lightheadedness.  You have swelling in your ankles.  You have trouble with your vision. SEEK IMMEDIATE MEDICAL CARE IF:   You have sudden onset of chest pain or pressure, difficulty breathing, or other symptoms of a heart attack.  You have a severe headache.  You have symptoms of a stroke (such as sudden weakness, difficulty speaking, difficulty walking). MAKE SURE YOU:   Understand these instructions.  Will watch your condition.  Will get help right away if you are not doing well or get worse. Document Released: 03/20/2005 Document Revised: 06/12/2011 Document Reviewed: 10/18/2006 [facility] Patient Information 2014 [place].

## 2013-04-19 NOTE — ED Provider Notes (Signed)
Medical screening examination/treatment/procedure(s) were performed by non-physician practitioner and as supervising physician I was immediately available for consultation/collaboration.  EKG Interpretation    Date/Time:  Friday April 18 2013 21:55:49 EST Ventricular Rate:  91 PR Interval:  135 QRS Duration: 79 QT Interval:  402 QTC Calculation: 495 R Axis:   31 Text Interpretation:  Sinus rhythm Anteroseptal infarct, age indeterminate No significant change since last tracing Confirmed by DOCHERTY  MD, MEGAN [phone]) on 04/19/2013 12:20:58 AM              Shanna CiscoMegan E Docherty, MD 04/19/13 0021

## 2013-11-04 ENCOUNTER — Other Ambulatory Visit: Payer: Self-pay | Admitting: Nurse Practitioner

## 2013-11-04 DIAGNOSIS — Z1231 Encounter for screening mammogram for malignant neoplasm of breast: Secondary | ICD-10-CM

## 2014-01-09 ENCOUNTER — Other Ambulatory Visit: Payer: Self-pay | Admitting: *Deleted

## 2014-01-09 DIAGNOSIS — N184 Chronic kidney disease, stage 4 (severe): Secondary | ICD-10-CM

## 2014-01-09 DIAGNOSIS — Z01810 Encounter for preprocedural cardiovascular examination: Secondary | ICD-10-CM

## 2014-01-20 ENCOUNTER — Encounter: Payer: Self-pay | Admitting: Vascular Surgery

## 2014-01-21 ENCOUNTER — Ambulatory Visit (INDEPENDENT_AMBULATORY_CARE_PROVIDER_SITE_OTHER): Payer: Self-pay | Admitting: Vascular Surgery

## 2014-01-21 ENCOUNTER — Ambulatory Visit (HOSPITAL_COMMUNITY)
Admission: RE | Admit: 2014-01-21 | Discharge: 2014-01-21 | Disposition: A | Discharge status: Home / Self Care | Payer: No Typology Code available for payment source | Source: Ambulatory Visit | Attending: Vascular Surgery | Admitting: Vascular Surgery

## 2014-01-21 ENCOUNTER — Encounter: Payer: Self-pay | Admitting: Vascular Surgery

## 2014-01-21 ENCOUNTER — Ambulatory Visit (INDEPENDENT_AMBULATORY_CARE_PROVIDER_SITE_OTHER)
Admission: RE | Admit: 2014-01-21 | Discharge: 2014-01-21 | Disposition: A | Discharge status: Home / Self Care | Payer: No Typology Code available for payment source | Source: Ambulatory Visit | Attending: Vascular Surgery | Admitting: Vascular Surgery

## 2014-01-21 VITALS — BP 151/97 | HR 70 | Ht 65.0 in | Wt 231.1 lb

## 2014-01-21 DIAGNOSIS — N184 Chronic kidney disease, stage 4 (severe): Secondary | ICD-10-CM | POA: Insufficient documentation

## 2014-01-21 DIAGNOSIS — Z01810 Encounter for preprocedural cardiovascular examination: Secondary | ICD-10-CM | POA: Insufficient documentation

## 2014-01-21 DIAGNOSIS — N186 End stage renal disease: Secondary | ICD-10-CM | POA: Insufficient documentation

## 2014-01-21 NOTE — Progress Notes (Signed)
[facility] [place] HISTORY AND PHYSICAL   History of Present Illness:  Patient is a 58 y.o. year old female who presents for placement of a permanent hemodialysis access. The patient is right handed.  The patient is not currently on hemodialysis.  Her mother and sister were previously on hemodialysis. The cause of renal failure is thought to be secondary to hypertension.  Other chronic medical problems include asthma, arthritis which are both stable.  Past Medical History  Diagnosis Date  . Asthma   . Hypertension   . Gout   . Arthritis   . Insomnia   . Chronic kidney disease     No past surgical history on file.   Social History History  Substance Use Topics  . Smoking status: Current Every Day Smoker -- 20 years    Types: Cigarettes  . Smokeless tobacco: Not on file  . Alcohol Use: Yes     Comment: occ    Family History Family History  Problem Relation Age of Onset  . Diabetes Mother   . Hypertension Mother   . Heart disease Mother   . Heart attack Mother   . Peripheral vascular disease Mother   . Diabetes Father   . Heart disease Father   . Hypertension Father   . Diabetes Sister   . Hypertension Sister   . Heart disease Sister     before age 58  . Heart attack Sister   . Peripheral vascular disease Sister   . Heart disease Brother   . Hyperlipidemia Daughter     Allergies  No Known Allergies   Current Outpatient Prescriptions  Medication Sig Dispense Refill  . calcium carbonate 200 MG capsule Take 200 mg by mouth 2 (two) times daily with a meal.      . citalopram (CELEXA) 20 MG tablet Take 20 mg by mouth daily.      . furosemide (LASIX) 80 MG tablet Take 80 mg by mouth daily.      . metoprolol (LOPRESSOR) 50 MG tablet Take 50 mg by mouth 2 (two) times daily.      Marland Kitchen. amLODipine (NORVASC) 10 MG tablet Take 10 mg by mouth daily.      Marland Kitchen. aspirin EC 81 MG tablet Take 81 mg by mouth daily.      Marland Kitchen. losartan-hydrochlorothiazide (HYZAAR)  100-25 MG per tablet Take 1 tablet by mouth daily.      . predniSONE (DELTASONE) 20 MG tablet Take 3 tablets (60 mg total) by mouth daily.  9 tablet  0  . traZODone (DESYREL) 50 MG tablet Take 50 mg by mouth at bedtime as needed for sleep (sleep).      . triamcinolone cream (KENALOG) 0.1 % Apply 1 application topically 2 (two) times daily.       No current facility-administered medications for this visit.    ROS:   General:  No weight loss, Fever, chills  HEENT: No recent headaches, no nasal bleeding, no visual changes, no sore throat  Neurologic: No dizziness, blackouts, seizures. No recent symptoms of stroke or mini- stroke. No recent episodes of slurred speech, or temporary blindness.  Cardiac: No recent episodes of chest pain/pressure, no shortness of breath at rest.  No shortness of breath with exertion.  Denies history of atrial fibrillation or irregular heartbeat  Vascular: No history of rest pain in feet.  No history of claudication.  No history of non-healing ulcer, No history of DVT   Pulmonary: No home oxygen, no productive  cough, no hemoptysis,  No asthma or wheezing  Musculoskeletal:  [ x] Arthritis, [ ]  Low back pain,  [ ]  Joint pain  Hematologic:No history of hypercoagulable state.  No history of easy bleeding.  No history of anemia  Gastrointestinal: No hematochezia or melena,  No gastroesophageal reflux, no trouble swallowing  Urinary: [x ] chronic Kidney disease, [ ]  on HD - [ ]  MWF or [ ]  TTHS, [ ]  Burning with urination, [ ]  Frequent urination, [ ]  Difficulty urinating;   Skin: No rashes  Psychological: No history of anxiety,  No history of depression   Physical Examination  Filed Vitals:   01/21/14 1030  BP: 151/97  Pulse: 70  Height: 5\' 5"  (1.651 m)  Weight: 231 lb 1.6 oz (104.826 kg)  SpO2: 97%    Body mass index is 38.46 kg/(m^2).  General:  Alert and oriented, no acute distress HEENT: Normal Neck: No bruit or JVD Pulmonary: Clear to  auscultation bilaterally Cardiac: Regular Rate and Rhythm without murmur Gastrointestinal: Soft, non-tender, non-distended, no mass Skin: No rash Extremity Pulses:  2+ radial, brachial pulses bilaterally Musculoskeletal: No deformity or edema  Neurologic: Upper and lower extremity motor 5/5 and symmetric  DATA: Patient had a vein mapping ultrasound today which shows a left cephalic vein is greater than 3 mm above the elbow. The left basilic vein was also greater than 3 mm above the elbow. The right basilic vein was greater than 3 mm above the elbow. The cephalic vein was less than 3 mm in the right forearm and left forearm.   ASSESSMENT: Patient needs long-term hemodialysis access. Her upper arm cephalic on the left side seems adequate creation of a first time AV fistula.   PLAN:  Risk benefits possible complications and procedure details and placement of a left brachiocephalic AV fistula were explained the patient today. These include but are not limited to ischemic steal non-maturation wound problems bleeding infection she understands and agrees to proceed.  Fabienne Brunsharles Fields, MD [facility] [place] Office: [phone] Pager: [phone]

## 2014-01-22 ENCOUNTER — Other Ambulatory Visit: Payer: Self-pay

## 2014-01-22 ENCOUNTER — Encounter (HOSPITAL_COMMUNITY): Payer: Self-pay | Admitting: Pharmacy Technician

## 2014-01-27 ENCOUNTER — Encounter (HOSPITAL_COMMUNITY): Payer: Self-pay | Admitting: *Deleted

## 2014-01-27 MED ORDER — DEXTROSE 5 % IV SOLN
1.5000 g | INTRAVENOUS | Status: AC
  Administered 2014-01-28: 1.5 g via INTRAVENOUS
  Filled 2014-01-27: qty 1.5

## 2014-01-27 MED ORDER — SODIUM CHLORIDE 0.9 % IV SOLN
INTRAVENOUS | Status: DC
  Administered 2014-01-28 (×2): via INTRAVENOUS

## 2014-01-27 NOTE — Progress Notes (Signed)
Pt denies SOB, chest pain, and being under the care of a cardiologist. Pt denies having a stress test, echo and cardiac cath. Pt denies having a chest x ray within the last year.  

## 2014-01-27 NOTE — Progress Notes (Signed)
01/27/14 1050  OBSTRUCTIVE SLEEP APNEA  Have you ever been diagnosed with sleep apnea through a sleep study? No  Do you snore loudly (loud enough to be heard through closed doors)?  0  Do you often feel tired, fatigued, or sleepy during the daytime? 1  Has anyone observed you stop breathing during your sleep? 0  Do you have, or are you being treated for high blood pressure? 1  BMI more than 35 kg/m2? 1  Age over 58 years old? 1  Gender: 0  Obstructive Sleep Apnea Score 4

## 2014-01-27 NOTE — Progress Notes (Signed)
Pt made aware to arrive at 9:15 AM on DOS.

## 2014-01-28 ENCOUNTER — Ambulatory Visit (HOSPITAL_COMMUNITY)
Admission: RE | Admit: 2014-01-28 | Discharge: 2014-01-28 | Disposition: A | Discharge status: Home / Self Care | Payer: Medicaid Other | Source: Ambulatory Visit | Attending: Vascular Surgery | Admitting: Vascular Surgery

## 2014-01-28 ENCOUNTER — Encounter (HOSPITAL_COMMUNITY): Payer: Medicaid Other | Admitting: Anesthesiology

## 2014-01-28 ENCOUNTER — Encounter (HOSPITAL_COMMUNITY)
Admission: RE | Disposition: A | Discharge status: Home / Self Care | Payer: Self-pay | Source: Ambulatory Visit | Attending: Vascular Surgery

## 2014-01-28 ENCOUNTER — Encounter (HOSPITAL_COMMUNITY): Payer: Self-pay | Admitting: Anesthesiology

## 2014-01-28 ENCOUNTER — Ambulatory Visit (HOSPITAL_COMMUNITY): Payer: Medicaid Other

## 2014-01-28 ENCOUNTER — Other Ambulatory Visit: Payer: Self-pay | Admitting: *Deleted

## 2014-01-28 ENCOUNTER — Ambulatory Visit (HOSPITAL_COMMUNITY): Payer: Medicaid Other | Admitting: Anesthesiology

## 2014-01-28 DIAGNOSIS — F17210 Nicotine dependence, cigarettes, uncomplicated: Secondary | ICD-10-CM | POA: Diagnosis not present

## 2014-01-28 DIAGNOSIS — Z79899 Other long term (current) drug therapy: Secondary | ICD-10-CM | POA: Diagnosis not present

## 2014-01-28 DIAGNOSIS — M1990 Unspecified osteoarthritis, unspecified site: Secondary | ICD-10-CM | POA: Diagnosis not present

## 2014-01-28 DIAGNOSIS — J45909 Unspecified asthma, uncomplicated: Secondary | ICD-10-CM | POA: Diagnosis not present

## 2014-01-28 DIAGNOSIS — I120 Hypertensive chronic kidney disease with stage 5 chronic kidney disease or end stage renal disease: Secondary | ICD-10-CM | POA: Diagnosis present

## 2014-01-28 DIAGNOSIS — N184 Chronic kidney disease, stage 4 (severe): Secondary | ICD-10-CM

## 2014-01-28 DIAGNOSIS — Z01818 Encounter for other preprocedural examination: Secondary | ICD-10-CM

## 2014-01-28 DIAGNOSIS — N186 End stage renal disease: Secondary | ICD-10-CM | POA: Diagnosis not present

## 2014-01-28 DIAGNOSIS — G4700 Insomnia, unspecified: Secondary | ICD-10-CM | POA: Insufficient documentation

## 2014-01-28 DIAGNOSIS — Z4931 Encounter for adequacy testing for hemodialysis: Secondary | ICD-10-CM

## 2014-01-28 DIAGNOSIS — Z7982 Long term (current) use of aspirin: Secondary | ICD-10-CM | POA: Insufficient documentation

## 2014-01-28 DIAGNOSIS — M109 Gout, unspecified: Secondary | ICD-10-CM | POA: Insufficient documentation

## 2014-01-28 HISTORY — PX: AV FISTULA PLACEMENT: SHX1204

## 2014-01-28 HISTORY — DX: Major depressive disorder, single episode, unspecified: F32.9

## 2014-01-28 HISTORY — DX: Depression, unspecified: F32.A

## 2014-01-28 LAB — POCT I-STAT 4, (NA,K, GLUC, HGB,HCT)
GLUCOSE: 115 mg/dL — AB (ref 70–99)
HEMATOCRIT: 40 % (ref 36.0–46.0)
HEMOGLOBIN: 13.6 g/dL (ref 12.0–15.0)
Potassium: 4.6 mEq/L (ref 3.7–5.3)
Sodium: 143 mEq/L (ref 137–147)

## 2014-01-28 SURGERY — ARTERIOVENOUS (AV) FISTULA CREATION
Anesthesia: Monitor Anesthesia Care | Site: Arm Upper | Laterality: Left

## 2014-01-28 MED ORDER — CHLORHEXIDINE GLUCONATE CLOTH 2 % EX PADS: 6.0000 | MEDICATED_PAD | Freq: Once | CUTANEOUS | Status: DC

## 2014-01-28 MED ORDER — HYDROMORPHONE HCL 1 MG/ML IJ SOLN
0.2500 mg | INTRAMUSCULAR | Status: DC | PRN
  Administered 2014-01-28 (×2): 0.5 mg via INTRAVENOUS

## 2014-01-28 MED ORDER — FENTANYL CITRATE 0.05 MG/ML IJ SOLN
INTRAMUSCULAR | Status: AC
  Filled 2014-01-28: qty 5

## 2014-01-28 MED ORDER — PROTAMINE SULFATE 10 MG/ML IV SOLN
INTRAVENOUS | Status: AC
  Filled 2014-01-28: qty 5

## 2014-01-28 MED ORDER — FENTANYL CITRATE 0.05 MG/ML IJ SOLN
INTRAMUSCULAR | Status: DC | PRN
  Administered 2014-01-28: 50 ug via INTRAVENOUS
  Administered 2014-01-28: 25 ug via INTRAVENOUS
  Administered 2014-01-28 (×4): 50 ug via INTRAVENOUS
  Administered 2014-01-28: 100 ug via INTRAVENOUS
  Administered 2014-01-28: 50 ug via INTRAVENOUS
  Administered 2014-01-28: 25 ug via INTRAVENOUS

## 2014-01-28 MED ORDER — OXYCODONE HCL 5 MG PO TABS: 5.0000 mg | ORAL_TABLET | Freq: Four times a day (QID) | ORAL | Status: DC | PRN

## 2014-01-28 MED ORDER — HYDROMORPHONE HCL 1 MG/ML IJ SOLN
INTRAMUSCULAR | Status: AC
  Filled 2014-01-28: qty 1

## 2014-01-28 MED ORDER — DEXMEDETOMIDINE HCL IN NACL 200 MCG/50ML IV SOLN
INTRAVENOUS | Status: AC
  Filled 2014-01-28: qty 50

## 2014-01-28 MED ORDER — LIDOCAINE HCL (PF) 1 % IJ SOLN
INTRAMUSCULAR | Status: AC
  Filled 2014-01-28: qty 30

## 2014-01-28 MED ORDER — SODIUM CHLORIDE 0.9 % IV SOLN: INTRAVENOUS | Status: DC

## 2014-01-28 MED ORDER — HEPARIN SODIUM (PORCINE) 1000 UNIT/ML IJ SOLN
INTRAMUSCULAR | Status: AC
  Filled 2014-01-28: qty 1

## 2014-01-28 MED ORDER — HEPARIN SODIUM (PORCINE) 1000 UNIT/ML IJ SOLN
INTRAMUSCULAR | Status: DC | PRN
  Administered 2014-01-28: 5 mL via INTRAVENOUS

## 2014-01-28 MED ORDER — ONDANSETRON HCL 4 MG/2ML IJ SOLN
INTRAMUSCULAR | Status: DC | PRN
  Administered 2014-01-28: 4 mg via INTRAVENOUS

## 2014-01-28 MED ORDER — LIDOCAINE HCL (CARDIAC) 20 MG/ML IV SOLN
INTRAVENOUS | Status: AC
  Filled 2014-01-28: qty 5

## 2014-01-28 MED ORDER — LIDOCAINE HCL (PF) 1 % IJ SOLN
INTRAMUSCULAR | Status: DC | PRN
  Administered 2014-01-28: 10 mL via INTRADERMAL

## 2014-01-28 MED ORDER — SODIUM CHLORIDE 0.9 % IR SOLN
Status: DC | PRN
  Administered 2014-01-28: 13:00:00

## 2014-01-28 MED ORDER — PROPOFOL 10 MG/ML IV BOLUS
INTRAVENOUS | Status: AC
  Filled 2014-01-28: qty 20

## 2014-01-28 MED ORDER — 0.9 % SODIUM CHLORIDE (POUR BTL) OPTIME
TOPICAL | Status: DC | PRN
  Administered 2014-01-28: 1000 mL

## 2014-01-28 MED ORDER — MIDAZOLAM HCL 2 MG/2ML IJ SOLN
INTRAMUSCULAR | Status: AC
  Filled 2014-01-28: qty 2

## 2014-01-28 MED ORDER — ONDANSETRON HCL 4 MG/2ML IJ SOLN: 4.0000 mg | Freq: Once | INTRAMUSCULAR | Status: DC | PRN

## 2014-01-28 MED ORDER — DEXMEDETOMIDINE HCL IN NACL 200 MCG/50ML IV SOLN
INTRAVENOUS | Status: DC | PRN
  Administered 2014-01-28: 0.5 ug/kg/h via INTRAVENOUS

## 2014-01-28 MED ORDER — PROTAMINE SULFATE 10 MG/ML IV SOLN
INTRAVENOUS | Status: DC | PRN
  Administered 2014-01-28: 10 mg via INTRAVENOUS
  Administered 2014-01-28: 15 mg via INTRAVENOUS
  Administered 2014-01-28: 25 mg via INTRAVENOUS

## 2014-01-28 MED ORDER — MIDAZOLAM HCL 5 MG/5ML IJ SOLN
INTRAMUSCULAR | Status: DC | PRN
  Administered 2014-01-28: 2 mg via INTRAVENOUS

## 2014-01-28 SURGICAL SUPPLY — 32 items
ADH SKN CLS APL DERMABOND .7 (GAUZE/BANDAGES/DRESSINGS) ×1
ARMBAND PINK RESTRICT EXTREMIT (MISCELLANEOUS) ×2 IMPLANT
BLADE SURG 10 STRL SS (BLADE) ×2 IMPLANT
CANISTER SUCTION 2500CC (MISCELLANEOUS) ×2 IMPLANT
CLIP TI MEDIUM 6 (CLIP) ×2 IMPLANT
CLIP TI WIDE RED SMALL 6 (CLIP) ×2 IMPLANT
COVER PROBE W GEL 5X96 (DRAPES) ×2 IMPLANT
COVER SURGICAL LIGHT HANDLE (MISCELLANEOUS) ×2 IMPLANT
DECANTER SPIKE VIAL GLASS SM (MISCELLANEOUS) ×2 IMPLANT
DERMABOND ADVANCED (GAUZE/BANDAGES/DRESSINGS) ×1
DERMABOND ADVANCED .7 DNX12 (GAUZE/BANDAGES/DRESSINGS) ×1 IMPLANT
DRAIN PENROSE 1/4X12 LTX STRL (WOUND CARE) ×2 IMPLANT
ELECT REM PT RETURN 9FT ADLT (ELECTROSURGICAL) ×2
ELECTRODE REM PT RTRN 9FT ADLT (ELECTROSURGICAL) ×1 IMPLANT
GEL ULTRASOUND 20GR AQUASONIC (MISCELLANEOUS) IMPLANT
GLOVE BIO SURGEON STRL SZ7.5 (GLOVE) ×2 IMPLANT
GOWN STRL REUS W/ TWL LRG LVL3 (GOWN DISPOSABLE) ×3 IMPLANT
GOWN STRL REUS W/TWL LRG LVL3 (GOWN DISPOSABLE) ×6
KIT BASIN OR (CUSTOM PROCEDURE TRAY) ×2 IMPLANT
KIT ROOM TURNOVER OR (KITS) ×2 IMPLANT
LOOP VESSEL MINI RED (MISCELLANEOUS) IMPLANT
NS IRRIG 1000ML POUR BTL (IV SOLUTION) ×2 IMPLANT
PACK CV ACCESS (CUSTOM PROCEDURE TRAY) ×2 IMPLANT
PAD ARMBOARD 7.5X6 YLW CONV (MISCELLANEOUS) ×4 IMPLANT
PROBE PENCIL 8 MHZ STRL DISP (MISCELLANEOUS) ×1 IMPLANT
SPONGE SURGIFOAM ABS GEL 100 (HEMOSTASIS) IMPLANT
SUT PROLENE 7 0 BV 1 (SUTURE) ×2 IMPLANT
SUT VIC AB 3-0 SH 27 (SUTURE) ×2
SUT VIC AB 3-0 SH 27X BRD (SUTURE) ×1 IMPLANT
SUT VICRYL 4-0 PS2 18IN ABS (SUTURE) ×2 IMPLANT
UNDERPAD 30X30 INCONTINENT (UNDERPADS AND DIAPERS) ×2 IMPLANT
WATER STERILE IRR 1000ML POUR (IV SOLUTION) ×2 IMPLANT

## 2014-01-28 NOTE — H&P (View-Only) (Signed)
[facility] [place] HISTORY AND PHYSICAL   History of Present Illness:  Patient is a 58 y.o. year old female who presents for placement of a permanent hemodialysis access. The patient is right handed.  The patient is not currently on hemodialysis.  Her mother and sister were previously on hemodialysis. The cause of renal failure is thought to be secondary to hypertension.  Other chronic medical problems include asthma, arthritis which are both stable.  Past Medical History  Diagnosis Date  . Asthma   . Hypertension   . Gout   . Arthritis   . Insomnia   . Chronic kidney disease     No past surgical history on file.   Social History History  Substance Use Topics  . Smoking status: Current Every Day Smoker -- 20 years    Types: Cigarettes  . Smokeless tobacco: Not on file  . Alcohol Use: Yes     Comment: occ    Family History Family History  Problem Relation Age of Onset  . Diabetes Mother   . Hypertension Mother   . Heart disease Mother   . Heart attack Mother   . Peripheral vascular disease Mother   . Diabetes Father   . Heart disease Father   . Hypertension Father   . Diabetes Sister   . Hypertension Sister   . Heart disease Sister     before age 60  . Heart attack Sister   . Peripheral vascular disease Sister   . Heart disease Brother   . Hyperlipidemia Daughter     Allergies  No Known Allergies   Current Outpatient Prescriptions  Medication Sig Dispense Refill  . calcium carbonate 200 MG capsule Take 200 mg by mouth 2 (two) times daily with a meal.      . citalopram (CELEXA) 20 MG tablet Take 20 mg by mouth daily.      . furosemide (LASIX) 80 MG tablet Take 80 mg by mouth daily.      . metoprolol (LOPRESSOR) 50 MG tablet Take 50 mg by mouth 2 (two) times daily.      . amLODipine (NORVASC) 10 MG tablet Take 10 mg by mouth daily.      . aspirin EC 81 MG tablet Take 81 mg by mouth daily.      . losartan-hydrochlorothiazide (HYZAAR)  100-25 MG per tablet Take 1 tablet by mouth daily.      . predniSONE (DELTASONE) 20 MG tablet Take 3 tablets (60 mg total) by mouth daily.  9 tablet  0  . traZODone (DESYREL) 50 MG tablet Take 50 mg by mouth at bedtime as needed for sleep (sleep).      . triamcinolone cream (KENALOG) 0.1 % Apply 1 application topically 2 (two) times daily.       No current facility-administered medications for this visit.    ROS:   General:  No weight loss, Fever, chills  HEENT: No recent headaches, no nasal bleeding, no visual changes, no sore throat  Neurologic: No dizziness, blackouts, seizures. No recent symptoms of stroke or mini- stroke. No recent episodes of slurred speech, or temporary blindness.  Cardiac: No recent episodes of chest pain/pressure, no shortness of breath at rest.  No shortness of breath with exertion.  Denies history of atrial fibrillation or irregular heartbeat  Vascular: No history of rest pain in feet.  No history of claudication.  No history of non-healing ulcer, No history of DVT   Pulmonary: No home oxygen, no productive   cough, no hemoptysis,  No asthma or wheezing  Musculoskeletal:  [ x] Arthritis, [ ]  Low back pain,  [ ]  Joint pain  Hematologic:No history of hypercoagulable state.  No history of easy bleeding.  No history of anemia  Gastrointestinal: No hematochezia or melena,  No gastroesophageal reflux, no trouble swallowing  Urinary: [x ] chronic Kidney disease, [ ]  on HD - [ ]  MWF or [ ]  TTHS, [ ]  Burning with urination, [ ]  Frequent urination, [ ]  Difficulty urinating;   Skin: No rashes  Psychological: No history of anxiety,  No history of depression   Physical Examination  Filed Vitals:   01/21/14 1030  BP: 151/97  Pulse: 70  Height: 5\' 5"  (1.651 m)  Weight: 231 lb 1.6 oz (104.826 kg)  SpO2: 97%    Body mass index is 38.46 kg/(m^2).  General:  Alert and oriented, no acute distress HEENT: Normal Neck: No bruit or JVD Pulmonary: Clear to  auscultation bilaterally Cardiac: Regular Rate and Rhythm without murmur Gastrointestinal: Soft, non-tender, non-distended, no mass Skin: No rash Extremity Pulses:  2+ radial, brachial pulses bilaterally Musculoskeletal: No deformity or edema  Neurologic: Upper and lower extremity motor 5/5 and symmetric  DATA: Patient had a vein mapping ultrasound today which shows a left cephalic vein is greater than 3 mm above the elbow. The left basilic vein was also greater than 3 mm above the elbow. The right basilic vein was greater than 3 mm above the elbow. The cephalic vein was less than 3 mm in the right forearm and left forearm.   ASSESSMENT: Patient needs long-term hemodialysis access. Her upper arm cephalic on the left side seems adequate creation of a first time AV fistula.   PLAN:  Risk benefits possible complications and procedure details and placement of a left brachiocephalic AV fistula were explained the patient today. These include but are not limited to ischemic steal non-maturation wound problems bleeding infection she understands and agrees to proceed.  Fabienne Brunsharles Chantell Kunkler, MD [facility] [place] Office: [phone] Pager: [phone]

## 2014-01-28 NOTE — Anesthesia Postprocedure Evaluation (Signed)
  Anesthesia Post-op Note  Patient: [name]  Procedure(s) Performed: Procedure(s): ARTERIOVENOUS (AV) FISTULA CREATION (Left)  Patient Location: PACU  Anesthesia Type:MAC  Level of Consciousness: awake  Airway and Oxygen Therapy: Patient Spontanous Breathing  Post-op Pain: mild  Post-op Assessment: Post-op Vital signs reviewed  Post-op Vital Signs: Reviewed  Last Vitals:  Filed Vitals:   01/28/14 0926  BP: 120/90  Pulse: 78  Temp: 37 C  Resp: 20    Complications: No apparent anesthesia complications

## 2014-01-28 NOTE — Op Note (Signed)
Procedure: Left Brachial Cephalic AV fistula  Preop: ESRD  Postop: ESRD  Anesthesia: Local with MAC  Assistant: Karsten RoKim Trinh, PA C  Findings: 3.5 mm cephalic vein  Procedure: After obtaining informed consent, the patient was taken to the operating room.  The left upper extremity was prepped and draped in usual sterile fashion. Local anesthesia was infiltrated near the antecubital crease.  A transverse incision was then made near the antecubital crease the left arm. The incision was carried into the subcutaneous tissues down to level of the cephalic vein. The cephalic vein was approximately 3 mm in diameter. It was of good quality. This was dissected free circumferentially and small side branches ligated and divided between silk ties or clips. Next the brachial artery was dissected free in the medial portion of the incision. The artery was  3-4 mm in diameter. The vessel loops were placed proximal and distal to the planned site of arteriotomy. The patient was given 5000 units of intravenous heparin. After appropriate circulation time, the vessel loops were used to control the artery. A longitudinal opening was made in the brachial artery.  The vein was ligated distally with a 2-0 silk tie. The vein was controlled proximally with a fine bulldog clamp. The vein was then swung over to the artery and sewn end of vein to side of artery using a running 7-0 Prolene suture. Just prior to completion of the anastomosis, everything was fore bled back bled and thoroughly flushed. The anastomosis was secured, vessel loops released, and there was a palpable thrill in the fistula immediately. After hemostasis was obtained, the subcutaneous tissues were reapproximated using a running 3-0 Vicryl suture. The skin was then closed with a 4 0 Vicryl subcuticular stitch. Dermabond was applied to the skin incision.  The patient had a palpable radial pulse at the end of the case.  Fabienne Brunsharles Fields, MD [facility] [place] Office: [phone] Pager: [phone]

## 2014-01-28 NOTE — Transfer of Care (Signed)
Immediate Anesthesia Transfer of Care Note  Patient: [name]  Procedure(s) Performed: Procedure(s): ARTERIOVENOUS (AV) FISTULA CREATION (Left)  Patient Location: PACU  Anesthesia Type:MAC  Level of Consciousness: awake, alert , oriented and patient cooperative  Airway & Oxygen Therapy: Patient Spontanous Breathing and Patient connected to nasal cannula oxygen  Post-op Assessment: Report given to PACU RN, Post -op Vital signs reviewed and stable and Patient moving all extremities  Post vital signs: Reviewed and stable  Complications: No apparent anesthesia complications

## 2014-01-28 NOTE — Anesthesia Preprocedure Evaluation (Addendum)
Anesthesia Evaluation  Patient identified by MRN, date of birth, ID band Patient awake    Reviewed: Allergy & Precautions, H&P , NPO status , Patient's Chart, lab work & pertinent test results  Airway Mallampati: II  TM Distance: >3 FB     Dental   Pulmonary asthma , Current Smoker,  breath sounds clear to auscultation        Cardiovascular hypertension, Rhythm:Regular Rate:Normal     Neuro/Psych    GI/Hepatic   Endo/Other    Renal/GU Dialysis and ESRFRenal disease     Musculoskeletal  (+) Arthritis -,   Abdominal   Peds  Hematology   Anesthesia Other Findings   Reproductive/Obstetrics                            Anesthesia Physical Anesthesia Plan  ASA: III  Anesthesia Plan: MAC   Post-op Pain Management:    Induction: Intravenous  Airway Management Planned: Simple Face Mask  Additional Equipment:   Intra-op Plan:   Post-operative Plan:   Informed Consent: I have reviewed the patients History and Physical, chart, labs and discussed the procedure including the risks, benefits and alternatives for the proposed anesthesia with the patient or authorized representative who has indicated his/her understanding and acceptance.     Plan Discussed with: CRNA, Anesthesiologist and Surgeon  Anesthesia Plan Comments:        Anesthesia Quick Evaluation

## 2014-01-28 NOTE — Interval H&P Note (Signed)
History and Physical Interval Note:  01/28/2014 12:25 PM  [name]  has presented today for surgery, with the diagnosis of Chronic Kidney Disease N18.9  The various methods of treatment have been discussed with the patient and family. After consideration of risks, benefits and other options for treatment, the patient has consented to  Procedure(s): ARTERIOVENOUS (AV) FISTULA CREATION (Left) as a surgical intervention .  The patient's history has been reviewed, patient examined, no change in status, stable for surgery.  I have reviewed the patient's chart and labs.  Questions were answered to the patient's satisfaction.     Davonda Ausley E

## 2014-01-29 ENCOUNTER — Telehealth: Payer: Self-pay | Admitting: Vascular Surgery

## 2014-01-29 NOTE — Telephone Encounter (Addendum)
Message copied by Fredrich BirksMILLIKAN, DANA P on Thu Jan 29, 2014  2:15 PM ------      Message from: [place]CCHESNEY, [place] K      Created: Wed Jan 28, 2014  4:17 PM      Regarding: Schedule                   ----- Message -----         From: Raymond GurneyKimberly A Trinh, PA-C         Sent: 01/28/2014   2:22 PM           To: [facility] Charge Pool            S/p left brachial cephalic AV fistula 01/28/14            F/u with Dr. Darrick PennaFields in 6 weeks with duplex            Thanks,      Kim ------  01/29/14: left msg for pt re appt, dpm

## 2014-01-30 ENCOUNTER — Encounter (HOSPITAL_COMMUNITY): Payer: Self-pay | Admitting: Vascular Surgery

## 2014-03-06 ENCOUNTER — Other Ambulatory Visit (HOSPITAL_COMMUNITY): Payer: Self-pay

## 2014-03-10 ENCOUNTER — Other Ambulatory Visit (HOSPITAL_COMMUNITY): Payer: Self-pay

## 2014-03-11 ENCOUNTER — Encounter: Payer: Self-pay | Admitting: Vascular Surgery

## 2014-03-12 ENCOUNTER — Encounter: Payer: Self-pay | Admitting: Vascular Surgery

## 2014-05-28 ENCOUNTER — Telehealth: Payer: Self-pay

## 2014-05-28 DIAGNOSIS — N186 End stage renal disease: Secondary | ICD-10-CM

## 2014-05-28 DIAGNOSIS — Z48812 Encounter for surgical aftercare following surgery on the circulatory system: Secondary | ICD-10-CM

## 2014-05-28 DIAGNOSIS — M7989 Other specified soft tissue disorders: Secondary | ICD-10-CM

## 2014-05-28 NOTE — Telephone Encounter (Signed)
Phone call from pt's daughter.  Reported a 2-3 day hx of left arm swelling from elbow to shoulder.  Stated there isn't any redness or warmth noticeable; "it looks like it is bruised."  Denies fever/ chills.  C/o being more tired.  Daughter stated pt. denies any increased pain or numbness in left upper extremity.  Stated the pt. has difficulty gripping with her left hand; "she can barely use it."  Pt. has not started dialysis yet.  Discussed with PA.  Recommended to schedule pt. for duplex of the access and office evaluation.

## 2014-05-28 NOTE — Telephone Encounter (Signed)
Spoke with pts daughter, Brien MatesMeisha to schedule for Monday 06/01/14 @ 1030am. dpm

## 2014-05-29 ENCOUNTER — Encounter: Payer: Self-pay | Admitting: Surgery

## 2014-06-01 ENCOUNTER — Ambulatory Visit (INDEPENDENT_AMBULATORY_CARE_PROVIDER_SITE_OTHER): Payer: Medicaid Other | Admitting: Surgery

## 2014-06-01 ENCOUNTER — Encounter: Payer: Self-pay | Admitting: Surgery

## 2014-06-01 ENCOUNTER — Ambulatory Visit (HOSPITAL_COMMUNITY)
Admission: RE | Admit: 2014-06-01 | Discharge: 2014-06-01 | Disposition: A | Discharge status: Home / Self Care | Payer: Medicaid Other | Source: Ambulatory Visit | Attending: Surgery | Admitting: Surgery

## 2014-06-01 VITALS — BP 151/89 | HR 77 | Ht 65.0 in | Wt 225.0 lb

## 2014-06-01 DIAGNOSIS — Z48812 Encounter for surgical aftercare following surgery on the circulatory system: Secondary | ICD-10-CM | POA: Diagnosis not present

## 2014-06-01 DIAGNOSIS — M7989 Other specified soft tissue disorders: Secondary | ICD-10-CM | POA: Diagnosis not present

## 2014-06-01 DIAGNOSIS — N186 End stage renal disease: Secondary | ICD-10-CM | POA: Diagnosis present

## 2014-06-01 NOTE — Progress Notes (Signed)
Patient name: [name] MRN: [id number] DOB: 07-Dec-1955 Sex: female     Chief Complaint  Patient presents with  . Re-evaluation    c/o swelling/bruising L UA also c/o bilateral LE weakness and states "I have to stop b/c I don't know if my legs are going to give out"    HISTORY OF PRESENT ILLNESS: The patient is back for follow-up.  She underwent a left brachiocephalic fistula by Dr. Darrick Penna on 01/28/2014.  She is concerned about the swelling in her arm around the vein as well as its appearance.  She denies any symptoms of steal.  Past Medical History  Diagnosis Date  . Asthma   . Hypertension   . Gout   . Arthritis   . Insomnia   . Chronic kidney disease   . Depression     Past Surgical History  Procedure Laterality Date  . Multiple tooth extractions    . Av fistula placement Left 01/28/2014    Procedure: ARTERIOVENOUS (AV) FISTULA CREATION;  Surgeon: Sherren Kerns, MD;  Location: [facility] OR;  Service: Vascular;  Laterality: Left;    History   Social History  . Marital Status: Single    Spouse Name: N/A  . Number of Children: N/A  . Years of Education: N/A   Occupational History  . Not on file.   Social History Main Topics  . Smoking status: Current Every Day Smoker -- 0.50 packs/day for 20 years    Types: Cigarettes  . Smokeless tobacco: Never Used  . Alcohol Use: Yes     Comment: occ  . Drug Use: No  . Sexual Activity: Not on file   Other Topics Concern  . Not on file   Social History Narrative    Family History  Problem Relation Age of Onset  . Diabetes Mother   . Hypertension Mother   . Heart disease Mother   . Heart attack Mother   . Peripheral vascular disease Mother   . Diabetes Father   . Heart disease Father   . Hypertension Father   . Diabetes Sister   . Hypertension Sister   . Heart disease Sister     before age 41  . Heart attack Sister   . Peripheral vascular disease Sister   . Heart disease Brother   . Hyperlipidemia  Daughter     Allergies as of 06/01/2014  . (No Known Allergies)    Current Outpatient Prescriptions on File Prior to Visit  Medication Sig Dispense Refill  . furosemide (LASIX) 80 MG tablet Take 80 mg by mouth daily.    . metoprolol (LOPRESSOR) 50 MG tablet Take 50 mg by mouth 2 (two) times daily.    Marland Kitchen oxyCODONE (ROXICODONE) 5 MG immediate release tablet Take 1 tablet (5 mg total) by mouth every 6 (six) hours as needed for severe pain. 15 tablet 0  . sodium bicarbonate 650 MG tablet Take 650 mg by mouth 3 (three) times daily.    Marland Kitchen amLODipine (NORVASC) 10 MG tablet Take 10 mg by mouth daily.     No current facility-administered medications on file prior to visit.     REVIEW OF SYSTEMS: Cardiovascular: No chest pain, chest pressure, palpitations,No history of DVT or phlebitis.  Positive for pain in legs on walking and when lying flat Pulmonary: Positive for asthma or wheezing. Neurologic: No weakness, paresthesias, aphasia, or amaurosis. No dizziness. Hematologic: No bleeding problems or clotting disorders. Musculoskeletal: No joint pain or joint swelling. Gastrointestinal: No  blood in stool or hematemesis Genitourinary: No dysuria or hematuria. Psychiatric:: No history of major depression. Integumentary: No rashes or ulcers. Constitutional: No fever or chills.  PHYSICAL EXAMINATION:   Vital signs are BP 151/89 mmHg  Pulse 77  Ht 5\' 5"  (1.651 m)  Wt 225 lb (102.059 kg)  BMI 37.44 kg/m2  SpO2 100% General: The patient appears their stated age. HEENT:  No gross abnormalities Pulmonary:  Non labored breathing Musculoskeletal: There are no major deformities. Neurologic: No focal weakness or paresthesias are detected, Skin: There are no ulcer or rashes noted. Psychiatric: The patient has normal affect. Cardiovascular: Excellent thrill within the left brachiocephalic fistula.  There are prominent veins on the forearm as well as some ecchymosis around the antecubital  crease   Diagnostic Studies Ultrasound has been reviewed.  The vein has an excellent diameter.  It is deep as he gets into the upper arm.  There is a large branch in the mid arm which courses across the antecubital crease into the forearm  Assessment: Chronic renal insufficiency Plan: I reassured the patient today that her fistula is working nicely.  She is concerned about the appearance.  There are some prominent collaterals in the forearm which are likely arising from the large branch along the midportion of the fistula.  I suggested branch ligation, however she does not want any more surgery.  She does not have significant swelling of the arm.  She is just concerned about the dilated appearance of the vein.  I reassured her that I don't think there is anything wrong with her fistula.  She'll follow up on an as-needed basis.  Jorge NyV. Wells Brabham IV, M.D. [facility] Office: [phone] Pager:  [phone]

## 2014-06-17 ENCOUNTER — Inpatient Hospital Stay (HOSPITAL_COMMUNITY)
Admission: EM | Admit: 2014-06-17 | Discharge: 2014-09-11 | DRG: 981 | Disposition: A | Discharge status: Rehabilitation Facility | Payer: Medicaid Other | Attending: Internal Medicine | Admitting: Internal Medicine

## 2014-06-17 ENCOUNTER — Emergency Department (HOSPITAL_COMMUNITY): Payer: Medicaid Other

## 2014-06-17 ENCOUNTER — Encounter (HOSPITAL_COMMUNITY): Payer: Self-pay | Admitting: Emergency Medicine

## 2014-06-17 DIAGNOSIS — E876 Hypokalemia: Secondary | ICD-10-CM | POA: Diagnosis not present

## 2014-06-17 DIAGNOSIS — L988 Other specified disorders of the skin and subcutaneous tissue: Secondary | ICD-10-CM | POA: Diagnosis not present

## 2014-06-17 DIAGNOSIS — T462X5A Adverse effect of other antidysrhythmic drugs, initial encounter: Secondary | ICD-10-CM | POA: Diagnosis not present

## 2014-06-17 DIAGNOSIS — D75829 Heparin-induced thrombocytopenia, unspecified: Secondary | ICD-10-CM | POA: Diagnosis not present

## 2014-06-17 DIAGNOSIS — I639 Cerebral infarction, unspecified: Secondary | ICD-10-CM | POA: Diagnosis not present

## 2014-06-17 DIAGNOSIS — I272 Other secondary pulmonary hypertension: Secondary | ICD-10-CM | POA: Diagnosis not present

## 2014-06-17 DIAGNOSIS — N170 Acute kidney failure with tubular necrosis: Secondary | ICD-10-CM | POA: Diagnosis not present

## 2014-06-17 DIAGNOSIS — Q211 Atrial septal defect: Secondary | ICD-10-CM | POA: Diagnosis not present

## 2014-06-17 DIAGNOSIS — K651 Peritoneal abscess: Secondary | ICD-10-CM | POA: Diagnosis not present

## 2014-06-17 DIAGNOSIS — K275 Chronic or unspecified peptic ulcer, site unspecified, with perforation: Secondary | ICD-10-CM

## 2014-06-17 DIAGNOSIS — A047 Enterocolitis due to Clostridium difficile: Secondary | ICD-10-CM | POA: Diagnosis not present

## 2014-06-17 DIAGNOSIS — N2889 Other specified disorders of kidney and ureter: Secondary | ICD-10-CM | POA: Diagnosis present

## 2014-06-17 DIAGNOSIS — T814XXA Infection following a procedure, initial encounter: Secondary | ICD-10-CM | POA: Diagnosis not present

## 2014-06-17 DIAGNOSIS — F329 Major depressive disorder, single episode, unspecified: Secondary | ICD-10-CM | POA: Diagnosis present

## 2014-06-17 DIAGNOSIS — J9811 Atelectasis: Secondary | ICD-10-CM | POA: Diagnosis present

## 2014-06-17 DIAGNOSIS — E669 Obesity, unspecified: Secondary | ICD-10-CM | POA: Diagnosis not present

## 2014-06-17 DIAGNOSIS — IMO0002 Reserved for concepts with insufficient information to code with codable children: Secondary | ICD-10-CM

## 2014-06-17 DIAGNOSIS — Z419 Encounter for procedure for purposes other than remedying health state, unspecified: Secondary | ICD-10-CM

## 2014-06-17 DIAGNOSIS — K559 Vascular disorder of intestine, unspecified: Secondary | ICD-10-CM | POA: Diagnosis not present

## 2014-06-17 DIAGNOSIS — E11649 Type 2 diabetes mellitus with hypoglycemia without coma: Secondary | ICD-10-CM | POA: Diagnosis not present

## 2014-06-17 DIAGNOSIS — L299 Pruritus, unspecified: Secondary | ICD-10-CM | POA: Diagnosis not present

## 2014-06-17 DIAGNOSIS — E43 Unspecified severe protein-calorie malnutrition: Secondary | ICD-10-CM | POA: Diagnosis not present

## 2014-06-17 DIAGNOSIS — F17210 Nicotine dependence, cigarettes, uncomplicated: Secondary | ICD-10-CM | POA: Diagnosis present

## 2014-06-17 DIAGNOSIS — D638 Anemia in other chronic diseases classified elsewhere: Secondary | ICD-10-CM | POA: Diagnosis present

## 2014-06-17 DIAGNOSIS — K913 Postprocedural intestinal obstruction: Secondary | ICD-10-CM | POA: Diagnosis not present

## 2014-06-17 DIAGNOSIS — R188 Other ascites: Secondary | ICD-10-CM | POA: Diagnosis not present

## 2014-06-17 DIAGNOSIS — I63521 Cerebral infarction due to unspecified occlusion or stenosis of right anterior cerebral artery: Secondary | ICD-10-CM | POA: Diagnosis not present

## 2014-06-17 DIAGNOSIS — R109 Unspecified abdominal pain: Secondary | ICD-10-CM

## 2014-06-17 DIAGNOSIS — K255 Chronic or unspecified gastric ulcer with perforation: Secondary | ICD-10-CM | POA: Diagnosis present

## 2014-06-17 DIAGNOSIS — R1010 Upper abdominal pain, unspecified: Secondary | ICD-10-CM | POA: Diagnosis not present

## 2014-06-17 DIAGNOSIS — I714 Abdominal aortic aneurysm, without rupture: Secondary | ICD-10-CM | POA: Diagnosis not present

## 2014-06-17 DIAGNOSIS — Z6835 Body mass index (BMI) 35.0-35.9, adult: Secondary | ICD-10-CM | POA: Diagnosis not present

## 2014-06-17 DIAGNOSIS — F17200 Nicotine dependence, unspecified, uncomplicated: Secondary | ICD-10-CM | POA: Diagnosis present

## 2014-06-17 DIAGNOSIS — Z79899 Other long term (current) drug therapy: Secondary | ICD-10-CM | POA: Diagnosis not present

## 2014-06-17 DIAGNOSIS — T8112XA Postprocedural septic shock, initial encounter: Secondary | ICD-10-CM | POA: Diagnosis not present

## 2014-06-17 DIAGNOSIS — F419 Anxiety disorder, unspecified: Secondary | ICD-10-CM | POA: Diagnosis not present

## 2014-06-17 DIAGNOSIS — I471 Supraventricular tachycardia, unspecified: Secondary | ICD-10-CM | POA: Diagnosis not present

## 2014-06-17 DIAGNOSIS — Y95 Nosocomial condition: Secondary | ICD-10-CM | POA: Diagnosis not present

## 2014-06-17 DIAGNOSIS — I120 Hypertensive chronic kidney disease with stage 5 chronic kidney disease or end stage renal disease: Secondary | ICD-10-CM | POA: Diagnosis present

## 2014-06-17 DIAGNOSIS — D631 Anemia in chronic kidney disease: Secondary | ICD-10-CM | POA: Diagnosis not present

## 2014-06-17 DIAGNOSIS — B965 Pseudomonas (aeruginosa) (mallei) (pseudomallei) as the cause of diseases classified elsewhere: Secondary | ICD-10-CM | POA: Diagnosis not present

## 2014-06-17 DIAGNOSIS — J9601 Acute respiratory failure with hypoxia: Secondary | ICD-10-CM | POA: Diagnosis present

## 2014-06-17 DIAGNOSIS — J440 Chronic obstructive pulmonary disease with acute lower respiratory infection: Secondary | ICD-10-CM | POA: Diagnosis present

## 2014-06-17 DIAGNOSIS — R0603 Acute respiratory distress: Secondary | ICD-10-CM

## 2014-06-17 DIAGNOSIS — S31609D Unspecified open wound of abdominal wall, unspecified quadrant with penetration into peritoneal cavity, subsequent encounter: Secondary | ICD-10-CM | POA: Diagnosis not present

## 2014-06-17 DIAGNOSIS — I69354 Hemiplegia and hemiparesis following cerebral infarction affecting left non-dominant side: Secondary | ICD-10-CM | POA: Diagnosis not present

## 2014-06-17 DIAGNOSIS — K316 Fistula of stomach and duodenum: Secondary | ICD-10-CM | POA: Diagnosis not present

## 2014-06-17 DIAGNOSIS — N739 Female pelvic inflammatory disease, unspecified: Secondary | ICD-10-CM

## 2014-06-17 DIAGNOSIS — K550 Acute vascular disorders of intestine: Secondary | ICD-10-CM

## 2014-06-17 DIAGNOSIS — I6330 Cerebral infarction due to thrombosis of unspecified cerebral artery: Secondary | ICD-10-CM | POA: Diagnosis present

## 2014-06-17 DIAGNOSIS — Z992 Dependence on renal dialysis: Secondary | ICD-10-CM | POA: Diagnosis present

## 2014-06-17 DIAGNOSIS — T8132XA Disruption of internal operation (surgical) wound, not elsewhere classified, initial encounter: Secondary | ICD-10-CM | POA: Diagnosis not present

## 2014-06-17 DIAGNOSIS — F32A Depression, unspecified: Secondary | ICD-10-CM | POA: Diagnosis present

## 2014-06-17 DIAGNOSIS — I50810 Right heart failure, unspecified: Secondary | ICD-10-CM | POA: Insufficient documentation

## 2014-06-17 DIAGNOSIS — R0902 Hypoxemia: Secondary | ICD-10-CM

## 2014-06-17 DIAGNOSIS — E871 Hypo-osmolality and hyponatremia: Secondary | ICD-10-CM | POA: Diagnosis not present

## 2014-06-17 DIAGNOSIS — S31609A Unspecified open wound of abdominal wall, unspecified quadrant with penetration into peritoneal cavity, initial encounter: Secondary | ICD-10-CM | POA: Diagnosis not present

## 2014-06-17 DIAGNOSIS — Z515 Encounter for palliative care: Secondary | ICD-10-CM | POA: Insufficient documentation

## 2014-06-17 DIAGNOSIS — M79609 Pain in unspecified limb: Secondary | ICD-10-CM | POA: Diagnosis not present

## 2014-06-17 DIAGNOSIS — J101 Influenza due to other identified influenza virus with other respiratory manifestations: Secondary | ICD-10-CM | POA: Diagnosis present

## 2014-06-17 DIAGNOSIS — J45909 Unspecified asthma, uncomplicated: Secondary | ICD-10-CM | POA: Diagnosis present

## 2014-06-17 DIAGNOSIS — N250 Renal osteodystrophy: Secondary | ICD-10-CM | POA: Diagnosis present

## 2014-06-17 DIAGNOSIS — M1000 Idiopathic gout, unspecified site: Secondary | ICD-10-CM | POA: Diagnosis present

## 2014-06-17 DIAGNOSIS — R011 Cardiac murmur, unspecified: Secondary | ICD-10-CM | POA: Diagnosis present

## 2014-06-17 DIAGNOSIS — E875 Hyperkalemia: Secondary | ICD-10-CM | POA: Diagnosis present

## 2014-06-17 DIAGNOSIS — I2781 Cor pulmonale (chronic): Secondary | ICD-10-CM | POA: Diagnosis present

## 2014-06-17 DIAGNOSIS — I10 Essential (primary) hypertension: Secondary | ICD-10-CM | POA: Diagnosis present

## 2014-06-17 DIAGNOSIS — F411 Generalized anxiety disorder: Secondary | ICD-10-CM | POA: Diagnosis not present

## 2014-06-17 DIAGNOSIS — K251 Acute gastric ulcer with perforation: Secondary | ICD-10-CM | POA: Diagnosis not present

## 2014-06-17 DIAGNOSIS — G8190 Hemiplegia, unspecified affecting unspecified side: Secondary | ICD-10-CM | POA: Diagnosis not present

## 2014-06-17 DIAGNOSIS — E785 Hyperlipidemia, unspecified: Secondary | ICD-10-CM | POA: Diagnosis present

## 2014-06-17 DIAGNOSIS — K632 Fistula of intestine: Secondary | ICD-10-CM

## 2014-06-17 DIAGNOSIS — K650 Generalized (acute) peritonitis: Secondary | ICD-10-CM | POA: Diagnosis not present

## 2014-06-17 DIAGNOSIS — K567 Ileus, unspecified: Secondary | ICD-10-CM

## 2014-06-17 DIAGNOSIS — R17 Unspecified jaundice: Secondary | ICD-10-CM | POA: Diagnosis not present

## 2014-06-17 DIAGNOSIS — R0600 Dyspnea, unspecified: Secondary | ICD-10-CM

## 2014-06-17 DIAGNOSIS — J441 Chronic obstructive pulmonary disease with (acute) exacerbation: Secondary | ICD-10-CM | POA: Diagnosis present

## 2014-06-17 DIAGNOSIS — T829XXA Unspecified complication of cardiac and vascular prosthetic device, implant and graft, initial encounter: Secondary | ICD-10-CM | POA: Diagnosis not present

## 2014-06-17 DIAGNOSIS — N179 Acute kidney failure, unspecified: Secondary | ICD-10-CM | POA: Diagnosis not present

## 2014-06-17 DIAGNOSIS — I150 Renovascular hypertension: Secondary | ICD-10-CM | POA: Diagnosis not present

## 2014-06-17 DIAGNOSIS — I719 Aortic aneurysm of unspecified site, without rupture: Secondary | ICD-10-CM

## 2014-06-17 DIAGNOSIS — D7582 Heparin induced thrombocytopenia (HIT): Secondary | ICD-10-CM | POA: Diagnosis not present

## 2014-06-17 DIAGNOSIS — D649 Anemia, unspecified: Secondary | ICD-10-CM | POA: Diagnosis not present

## 2014-06-17 DIAGNOSIS — R7989 Other specified abnormal findings of blood chemistry: Secondary | ICD-10-CM | POA: Diagnosis not present

## 2014-06-17 DIAGNOSIS — R1084 Generalized abdominal pain: Secondary | ICD-10-CM | POA: Diagnosis not present

## 2014-06-17 DIAGNOSIS — Z95828 Presence of other vascular implants and grafts: Secondary | ICD-10-CM

## 2014-06-17 DIAGNOSIS — K6389 Other specified diseases of intestine: Secondary | ICD-10-CM | POA: Diagnosis present

## 2014-06-17 DIAGNOSIS — R0602 Shortness of breath: Secondary | ICD-10-CM | POA: Diagnosis present

## 2014-06-17 DIAGNOSIS — E039 Hypothyroidism, unspecified: Secondary | ICD-10-CM | POA: Diagnosis present

## 2014-06-17 DIAGNOSIS — D689 Coagulation defect, unspecified: Secondary | ICD-10-CM | POA: Diagnosis present

## 2014-06-17 DIAGNOSIS — J9690 Respiratory failure, unspecified, unspecified whether with hypoxia or hypercapnia: Secondary | ICD-10-CM

## 2014-06-17 DIAGNOSIS — I5032 Chronic diastolic (congestive) heart failure: Secondary | ICD-10-CM | POA: Diagnosis present

## 2014-06-17 DIAGNOSIS — I63421 Cerebral infarction due to embolism of right anterior cerebral artery: Secondary | ICD-10-CM | POA: Diagnosis not present

## 2014-06-17 DIAGNOSIS — G92 Toxic encephalopathy: Secondary | ICD-10-CM | POA: Diagnosis not present

## 2014-06-17 DIAGNOSIS — N2581 Secondary hyperparathyroidism of renal origin: Secondary | ICD-10-CM | POA: Diagnosis present

## 2014-06-17 DIAGNOSIS — A419 Sepsis, unspecified organism: Secondary | ICD-10-CM | POA: Diagnosis not present

## 2014-06-17 DIAGNOSIS — G8929 Other chronic pain: Secondary | ICD-10-CM | POA: Diagnosis not present

## 2014-06-17 DIAGNOSIS — J90 Pleural effusion, not elsewhere classified: Secondary | ICD-10-CM

## 2014-06-17 DIAGNOSIS — R1013 Epigastric pain: Secondary | ICD-10-CM | POA: Diagnosis not present

## 2014-06-17 DIAGNOSIS — N189 Chronic kidney disease, unspecified: Secondary | ICD-10-CM | POA: Diagnosis not present

## 2014-06-17 DIAGNOSIS — E872 Acidosis: Secondary | ICD-10-CM | POA: Diagnosis present

## 2014-06-17 DIAGNOSIS — T82590A Other mechanical complication of surgically created arteriovenous fistula, initial encounter: Secondary | ICD-10-CM

## 2014-06-17 DIAGNOSIS — K9189 Other postprocedural complications and disorders of digestive system: Secondary | ICD-10-CM | POA: Insufficient documentation

## 2014-06-17 DIAGNOSIS — I6523 Occlusion and stenosis of bilateral carotid arteries: Secondary | ICD-10-CM | POA: Diagnosis present

## 2014-06-17 DIAGNOSIS — I953 Hypotension of hemodialysis: Secondary | ICD-10-CM | POA: Diagnosis not present

## 2014-06-17 DIAGNOSIS — Z9289 Personal history of other medical treatment: Secondary | ICD-10-CM

## 2014-06-17 DIAGNOSIS — K929 Disease of digestive system, unspecified: Secondary | ICD-10-CM | POA: Diagnosis not present

## 2014-06-17 DIAGNOSIS — T82898A Other specified complication of vascular prosthetic devices, implants and grafts, initial encounter: Secondary | ICD-10-CM | POA: Diagnosis not present

## 2014-06-17 DIAGNOSIS — I472 Ventricular tachycardia: Secondary | ICD-10-CM | POA: Diagnosis not present

## 2014-06-17 DIAGNOSIS — K631 Perforation of intestine (nontraumatic): Secondary | ICD-10-CM | POA: Diagnosis not present

## 2014-06-17 DIAGNOSIS — R1030 Lower abdominal pain, unspecified: Secondary | ICD-10-CM | POA: Diagnosis not present

## 2014-06-17 DIAGNOSIS — N186 End stage renal disease: Secondary | ICD-10-CM | POA: Insufficient documentation

## 2014-06-17 DIAGNOSIS — E46 Unspecified protein-calorie malnutrition: Secondary | ICD-10-CM | POA: Insufficient documentation

## 2014-06-17 DIAGNOSIS — G8194 Hemiplegia, unspecified affecting left nondominant side: Secondary | ICD-10-CM | POA: Diagnosis not present

## 2014-06-17 DIAGNOSIS — T85698A Other mechanical complication of other specified internal prosthetic devices, implants and grafts, initial encounter: Secondary | ICD-10-CM

## 2014-06-17 DIAGNOSIS — Z452 Encounter for adjustment and management of vascular access device: Secondary | ICD-10-CM

## 2014-06-17 DIAGNOSIS — I509 Heart failure, unspecified: Secondary | ICD-10-CM | POA: Diagnosis not present

## 2014-06-17 DIAGNOSIS — R609 Edema, unspecified: Secondary | ICD-10-CM

## 2014-06-17 DIAGNOSIS — Z4659 Encounter for fitting and adjustment of other gastrointestinal appliance and device: Secondary | ICD-10-CM

## 2014-06-17 DIAGNOSIS — R079 Chest pain, unspecified: Secondary | ICD-10-CM

## 2014-06-17 DIAGNOSIS — J9600 Acute respiratory failure, unspecified whether with hypoxia or hypercapnia: Secondary | ICD-10-CM

## 2014-06-17 DIAGNOSIS — J811 Chronic pulmonary edema: Secondary | ICD-10-CM

## 2014-06-17 HISTORY — DX: Aortic aneurysm of unspecified site, without rupture: I71.9

## 2014-06-17 HISTORY — DX: Patent foramen ovale: Q21.12

## 2014-06-17 HISTORY — DX: Disorder of kidney and ureter, unspecified: N28.9

## 2014-06-17 HISTORY — DX: Chronic obstructive pulmonary disease, unspecified: J44.9

## 2014-06-17 HISTORY — DX: Atrial septal defect: Q21.1

## 2014-06-17 HISTORY — DX: Cerebral infarction, unspecified: I63.9

## 2014-06-17 HISTORY — DX: Supraventricular tachycardia, unspecified: I47.10

## 2014-06-17 HISTORY — DX: Cor pulmonale (chronic): I27.81

## 2014-06-17 HISTORY — DX: Supraventricular tachycardia: I47.1

## 2014-06-17 LAB — CBC WITH DIFFERENTIAL/PLATELET
BASOS PCT: 1 % (ref 0–1)
Basophils Absolute: 0.1 10*3/uL (ref 0.0–0.1)
Eosinophils Absolute: 0 10*3/uL (ref 0.0–0.7)
Eosinophils Relative: 0 % (ref 0–5)
HCT: 39.7 % (ref 36.0–46.0)
HEMOGLOBIN: 12.4 g/dL (ref 12.0–15.0)
LYMPHS PCT: 21 % (ref 12–46)
Lymphs Abs: 1.5 10*3/uL (ref 0.7–4.0)
MCH: 26.1 pg (ref 26.0–34.0)
MCHC: 31.2 g/dL (ref 30.0–36.0)
MCV: 83.6 fL (ref 78.0–100.0)
Monocytes Absolute: 1.3 10*3/uL — ABNORMAL HIGH (ref 0.1–1.0)
Monocytes Relative: 18 % — ABNORMAL HIGH (ref 3–12)
NEUTROS PCT: 60 % (ref 43–77)
Neutro Abs: 4.2 10*3/uL (ref 1.7–7.7)
Platelets: 305 10*3/uL (ref 150–400)
RBC: 4.75 MIL/uL (ref 3.87–5.11)
RDW: 19.6 % — AB (ref 11.5–15.5)
WBC: 7 10*3/uL (ref 4.0–10.5)

## 2014-06-17 LAB — BASIC METABOLIC PANEL
Anion gap: 13 (ref 5–15)
BUN: 64 mg/dL — AB (ref 6–23)
CO2: 18 mmol/L — AB (ref 19–32)
CREATININE: 5.47 mg/dL — AB (ref 0.50–1.10)
Calcium: 8.2 mg/dL — ABNORMAL LOW (ref 8.4–10.5)
Chloride: 107 mmol/L (ref 96–112)
GFR calc Af Amer: 9 mL/min — ABNORMAL LOW (ref >90)
GFR, EST NON AFRICAN AMERICAN: 8 mL/min — AB (ref >90)
GLUCOSE: 104 mg/dL — AB (ref 70–99)
Potassium: 5.5 mmol/L — ABNORMAL HIGH (ref 3.5–5.1)
SODIUM: 138 mmol/L (ref 135–145)

## 2014-06-17 LAB — BLOOD GAS, ARTERIAL
Acid-base deficit: 9 mmol/L — ABNORMAL HIGH (ref 0.0–2.0)
Bicarbonate: 17.8 mEq/L — ABNORMAL LOW (ref 20.0–24.0)
Drawn by: 308601
O2 CONTENT: 5 L/min
O2 Saturation: 89.1 %
PATIENT TEMPERATURE: 98.6
PH ART: 7.235 — AB (ref 7.350–7.450)
TCO2: 16.5 mmol/L (ref 0–100)
pCO2 arterial: 43.6 mmHg (ref 35.0–45.0)
pO2, Arterial: 69.7 mmHg — ABNORMAL LOW (ref 80.0–100.0)

## 2014-06-17 LAB — PROTIME-INR
INR: 1.27 (ref 0.00–1.49)
Prothrombin Time: 16 seconds — ABNORMAL HIGH (ref 11.6–15.2)

## 2014-06-17 LAB — TROPONIN I: Troponin I: 1.13 ng/mL (ref <0.031)

## 2014-06-17 LAB — I-STAT TROPONIN, ED: TROPONIN I, POC: 0.82 ng/mL — AB (ref 0.00–0.08)

## 2014-06-17 LAB — APTT: aPTT: 27 seconds (ref 24–37)

## 2014-06-17 LAB — D-DIMER, QUANTITATIVE: D-Dimer, Quant: 7.28 ug/mL-FEU — ABNORMAL HIGH (ref 0.00–0.48)

## 2014-06-17 LAB — BRAIN NATRIURETIC PEPTIDE: B NATRIURETIC PEPTIDE 5: 1378.8 pg/mL — AB (ref 0.0–100.0)

## 2014-06-17 LAB — MRSA PCR SCREENING: MRSA by PCR: NEGATIVE

## 2014-06-17 MED ORDER — CITALOPRAM HYDROBROMIDE 20 MG PO TABS
20.0000 mg | ORAL_TABLET | Freq: Every day | ORAL | Status: DC
  Administered 2014-06-17 – 2014-07-01 (×15): 20 mg via ORAL
  Filled 2014-06-17 (×16): qty 1

## 2014-06-17 MED ORDER — FUROSEMIDE 10 MG/ML IJ SOLN
160.0000 mg | Freq: Once | INTRAVENOUS | Status: AC
  Administered 2014-06-17: 160 mg via INTRAVENOUS
  Filled 2014-06-17: qty 16

## 2014-06-17 MED ORDER — HYDROXYZINE HCL 25 MG PO TABS
25.0000 mg | ORAL_TABLET | Freq: Three times a day (TID) | ORAL | Status: DC | PRN
  Administered 2014-06-23 – 2014-07-01 (×5): 25 mg via ORAL
  Filled 2014-06-17 (×6): qty 1

## 2014-06-17 MED ORDER — HEPARIN (PORCINE) IN NACL 100-0.45 UNIT/ML-% IJ SOLN
950.0000 [IU]/h | INTRAMUSCULAR | Status: DC
  Administered 2014-06-17: 950 [IU]/h via INTRAVENOUS
  Filled 2014-06-17 (×2): qty 250

## 2014-06-17 MED ORDER — ALLOPURINOL 100 MG PO TABS
100.0000 mg | ORAL_TABLET | Freq: Two times a day (BID) | ORAL | Status: DC | PRN
  Filled 2014-06-17: qty 1

## 2014-06-17 MED ORDER — HEPARIN BOLUS VIA INFUSION
4000.0000 [IU] | Freq: Once | INTRAVENOUS | Status: AC
  Administered 2014-06-17: 4000 [IU] via INTRAVENOUS
  Filled 2014-06-17: qty 4000

## 2014-06-17 MED ORDER — OXYCODONE HCL 5 MG PO TABS
5.0000 mg | ORAL_TABLET | Freq: Four times a day (QID) | ORAL | Status: DC | PRN
  Administered 2014-06-25 – 2014-07-01 (×7): 5 mg via ORAL
  Filled 2014-06-17 (×7): qty 1

## 2014-06-17 MED ORDER — ASPIRIN 81 MG PO CHEW
324.0000 mg | CHEWABLE_TABLET | Freq: Every day | ORAL | Status: DC
  Administered 2014-06-18: 324 mg via ORAL
  Filled 2014-06-17: qty 4

## 2014-06-17 MED ORDER — SODIUM CHLORIDE 0.9 % IV SOLN: 250.0000 mL | INTRAVENOUS | Status: DC | PRN

## 2014-06-17 MED ORDER — ACETAMINOPHEN 325 MG PO TABS
650.0000 mg | ORAL_TABLET | ORAL | Status: DC | PRN
  Administered 2014-06-28 – 2014-07-01 (×2): 650 mg via ORAL
  Filled 2014-06-17 (×2): qty 2

## 2014-06-17 MED ORDER — ASPIRIN 81 MG PO CHEW
324.0000 mg | CHEWABLE_TABLET | Freq: Once | ORAL | Status: AC
  Administered 2014-06-17: 324 mg via ORAL
  Filled 2014-06-17: qty 4

## 2014-06-17 MED ORDER — HEPARIN SODIUM (PORCINE) 5000 UNIT/ML IJ SOLN
5000.0000 [IU] | Freq: Three times a day (TID) | INTRAMUSCULAR | Status: DC
  Administered 2014-06-17: 5000 [IU] via SUBCUTANEOUS
  Filled 2014-06-17 (×3): qty 1

## 2014-06-17 MED ORDER — ASPIRIN 300 MG RE SUPP
300.0000 mg | Freq: Every day | RECTAL | Status: DC
  Filled 2014-06-17: qty 1

## 2014-06-17 MED ORDER — FUROSEMIDE 10 MG/ML IJ SOLN
100.0000 mg | Freq: Once | INTRAVENOUS | Status: DC
  Filled 2014-06-17: qty 10

## 2014-06-17 MED ORDER — ALBUTEROL SULFATE (2.5 MG/3ML) 0.083% IN NEBU: 2.5000 mg | INHALATION_SOLUTION | RESPIRATORY_TRACT | Status: DC | PRN

## 2014-06-17 MED ORDER — ONDANSETRON HCL 4 MG/2ML IJ SOLN
4.0000 mg | Freq: Four times a day (QID) | INTRAMUSCULAR | Status: DC | PRN
  Administered 2014-06-18 – 2014-08-06 (×4): 4 mg via INTRAVENOUS
  Filled 2014-06-17 (×6): qty 2

## 2014-06-17 MED ORDER — METHYLPREDNISOLONE SODIUM SUCC 125 MG IJ SOLR
60.0000 mg | Freq: Three times a day (TID) | INTRAMUSCULAR | Status: DC
  Administered 2014-06-17 – 2014-06-18 (×2): 60 mg via INTRAVENOUS
  Filled 2014-06-17 (×2): qty 2

## 2014-06-17 MED ORDER — FUROSEMIDE 10 MG/ML IJ SOLN
160.0000 mg | Freq: Four times a day (QID) | INTRAMUSCULAR | Status: DC
  Administered 2014-06-17 – 2014-06-18 (×2): 160 mg via INTRAVENOUS
  Filled 2014-06-17 (×3): qty 16

## 2014-06-17 MED ORDER — IPRATROPIUM-ALBUTEROL 0.5-2.5 (3) MG/3ML IN SOLN
3.0000 mL | Freq: Four times a day (QID) | RESPIRATORY_TRACT | Status: DC
  Administered 2014-06-17 – 2014-06-20 (×11): 3 mL via RESPIRATORY_TRACT
  Filled 2014-06-17 (×12): qty 3

## 2014-06-17 NOTE — Progress Notes (Signed)
CRITICAL VALUE ALERT  Critical value received: Troponin I: 1.13  Date of notification: 06/17/14  Time of notification:  2135  Critical value read back:Yes.    Nurse who received alert:  Drue Stageraroline Latash Nouri  MD notified (1st page): Kendrick FriesMcQuaid, D  Time of first page:  2145  MD notified (2nd page):  Time of second page:  Responding MD:  Curley SpiceMcQuaid, D  Time MD responded:  2145

## 2014-06-17 NOTE — H&P (Signed)
PULMONARY / CRITICAL CARE MEDICINE   Name: [name] MRN: [id number] DOB: [birth year]    ADMISSION DATE:  06/17/2014 CONSULTATION DATE:  06/17/14  REFERRING MD :  Rennis Chris  CHIEF COMPLAINT:  Shortness of breath  INITIAL PRESENTATION: 59 y/o female came to the [facility] on 3/16 with shortness of breath and acute hypoxemic respiratory failure felt to be due to volume overload.  STUDIES:    SIGNIFICANT EVENTS:   HISTORY OF PRESENT ILLNESS:  59 y/o female with CKD and hypertension came to the [facility] ED on 3/16 complaining of shortness of breath and generalized weakness x 2 weeks. Worse with exhertion, also described chest tightness during these times.  She noted not taking her diuretic medications for majority of that time period.  RLE edema was "bad" but has now resolved. She denied fever but noted some mild chills.  In the ED she was noted to be hypoxemic and in what is believed to be acute on chronic renal failure.  She was given lasix and renal and critical care medicine were consulted. Currently she feels much better and is breathing comfortably on 100% NRB.   PAST MEDICAL HISTORY :   has a past medical history of Asthma; Hypertension; Gout; Arthritis; Insomnia; Chronic kidney disease; and Depression.  has past surgical history that includes Multiple tooth extractions and AV fistula placement (Left, 01/28/2014). Prior to Admission medications   Medication Sig Start Date End Date Taking? Authorizing Provider  allopurinol (ZYLOPRIM) 100 MG tablet Take 100 mg by mouth 2 (two) times daily as needed (for gout).   Yes Historical Provider, MD  citalopram (CELEXA) 20 MG tablet Take 20 mg by mouth daily.   Yes Historical Provider, MD  furosemide (LASIX) 80 MG tablet Take 80 mg by mouth daily.   Yes Historical Provider, MD  hydrOXYzine (ATARAX/VISTARIL) 25 MG tablet Take 25 mg by mouth 3 (three) times daily as needed for anxiety.   Yes Historical Provider, MD  oxyCODONE (ROXICODONE) 5 MG immediate  release tablet Take 1 tablet (5 mg total) by mouth every 6 (six) hours as needed for severe pain. Patient not taking: Reported on 06/17/2014 01/28/14   Raymond Gurney, PA-C   No Known Allergies  FAMILY HISTORY:  has no family status information on file.  SOCIAL HISTORY:  reports that she has been smoking Cigarettes.  She has a 10 pack-year smoking history. She has never used smokeless tobacco. She reports that she drinks alcohol. She reports that she does not use illicit drugs.  REVIEW OF SYSTEMS:   Bolds are positive  Constitutional: weight loss, gain, night sweats, Fevers, chills, fatigue .  HEENT: headaches, Sore throat, sneezing, nasal congestion, post nasal drip, Difficulty swallowing, Tooth/dental problems, visual complaints visual changes, ear ache CV:  chest pain exertional, radiates: ,Orthopnea, PND, swelling in lower extremities, dizziness, palpitations, syncope.  GI  heartburn, indigestion, abdominal pain, nausea, vomiting, diarrhea, change in bowel habits, loss of appetite, bloody stools.  Resp: cough, productive: , hemoptysis, dyspnea, chest pain, pleuritic.  Skin: rash or itching or icterus GU: dysuria, change in color of urine, urgency or frequency. flank pain, hematuria  MS: joint pain or swelling. decreased range of motion  Psych: change in mood or affect. depression or anxiety.  Neuro: difficulty with speech, weakness, numbness, ataxia    SUBJECTIVE:   VITAL SIGNS: Temp:  [97.7 F (36.5 C)-98.7 F (37.1 C)] 98.7 F (37.1 C) (03/16 1638) Pulse Rate:  [70-72] 70 (03/16 1915) Resp:  [18-25] 23 (03/16 1915) BP: (  96-115)/(63-75) 104/75 mmHg (03/16 1900) SpO2:  [76 %-96 %] 96 % (03/16 1915) HEMODYNAMICS:   VENTILATOR SETTINGS:   INTAKE / OUTPUT: No intake or output data in the 24 hours ending 06/17/14 2016  PHYSICAL EXAMINATION: General:  Obese female in NAD on NRB Neuro:  Alert, oriented, non-focal HEENT:  [place]/AT, PERRL, mild JVP elevation Cardiovascular:   RRR, no MRG Lungs:  Coarse wheeze throughout Abdomen:  Obese, soft, non-tender Musculoskeletal:  No acute deformity Skin:  Grossly intact  LABS:  CBC  Recent Labs Lab 06/17/14 1707  WBC 7.0  HGB 12.4  HCT 39.7  PLT 305   Coag's No results for input(s): APTT, INR in the last 168 hours. BMET  Recent Labs Lab 06/17/14 1707  NA 138  K 5.5*  CL 107  CO2 18*  BUN 64*  CREATININE 5.47*  GLUCOSE 104*   Electrolytes  Recent Labs Lab 06/17/14 1707  CALCIUM 8.2*   Sepsis Markers No results for input(s): LATICACIDVEN, PROCALCITON, O2SATVEN in the last 168 hours. ABG No results for input(s): PHART, PCO2ART, PO2ART in the last 168 hours. Liver Enzymes No results for input(s): AST, ALT, ALKPHOS, BILITOT, ALBUMIN in the last 168 hours. Cardiac Enzymes No results for input(s): TROPONINI, PROBNP in the last 168 hours. Glucose No results for input(s): GLUCAP in the last 168 hours.  Imaging No results found.   ASSESSMENT / PLAN:  PULMONARY A:Acute hypoxemic respiratory failure > no clear infiltrate on CXR; ddx includes pulmonary edema (intersitial edema? Vs PE , d-dimer elevated) Less likely acute viral bronchitis ? COPD vs cardiac asthma Tobacco abuse disorder P:   Titrate O2 for O2 saturation > 92% Check ABG now V/Q in AM Start heparin gtt Scheduled/PRN BDs Solu-medrol See ID, cardiology  CARDIOVASCULAR A:  Troponin elevation> likely demand ischemia; however intermittent exertional chest tightness so consider STEMI, PE Acute volume overload likely due to acute on chronic renal insufficiency and non-compliance with diuretics Hypertension  P:  Lasix now and q6 overnight Echo Serial troponin ASA Serial EKG Heparin gtt May need cardiology consult Continue home metoprolol Cardiology consult in am  RENAL A:   Acute on chronic renal failure, currently does not appear to need dialysis Non AG acidosis Hyperkalemia P:   Getting lasix Renal consult  now Monitor BMET and UOP Replace electrolytes as needed Consider HCO3 gtt  GASTROINTESTINAL A:   No acute issues P:   Renal diet  HEMATOLOGIC A:   No acute issues P:  Monitor for bleeding  INFECTIOUS A:   Cough with scant mucus production, no evidence of pneumonia; viral bronchitis? P:   Influenza rapid swab 3/16 >  Monitor for fever  ENDOCRINE A:   No acute issues   P:   Monitor glucose  NEUROLOGIC A:   No acute issues P:   Monitor  FAMILY  - Updates: Patient an daughter updated at bedside 3/16  - Inter-disciplinary family meet or Palliative Care meeting due by:  06/24/2014   Joneen RoachPaul Viviane Semidey, AGACNP-BC [place] [facility] Pager [phone] or [phone]

## 2014-06-17 NOTE — Progress Notes (Signed)
eLink Physician-Brief Progress Note Patient Name: [name] DOB: Jul 27, 1955 MRN: [id number]   Date of Service  06/17/2014  HPI/Events of Note  Admission requested by [facility] Pt with near ESRD, on diuretics at home but stopped taking them in the last few weeks. Now with acute hypoxemic respiratory failure and signs of volume overload on exam  eICU Interventions  Will have on ground team come for admission In meantime, will use ELINK camera in [facility] to facilitate admission     Intervention Category Evaluation Type: New Patient Evaluation  MCQUAID, DOUGLAS 06/17/2014, 8:03 PM

## 2014-06-17 NOTE — ED Notes (Addendum)
Pt c/o SOB starting x2 days ago. Has had a productive cough (yellow sputum). Hx asthma. Is supposed to see PCP Friday. Has had increased SOB and fatigue x1 week. Had left AV fistula placed in October. Has not started dialysis yet. Is [facility] (appointment next month). Patient is in apparent distress. SpO2 saturation 76%. Reports left arm swelling and abdominal swelling. Non-rebreather applied. No other c/c. RR even but labored. Speaking full/clear sentences.

## 2014-06-17 NOTE — ED Provider Notes (Signed)
CSN: [id number]     Arrival date & time 06/17/14  1537 History   First MD Initiated Contact with Patient 06/17/14 1620     Chief Complaint  Patient presents with  . Shortness of Breath  . Asthma  . Cough     (Consider location/radiation/quality/duration/timing/severity/associated sxs/prior Treatment) HPI Complains of progressively worsening shortness of breath abdomen and leg swelling onset one week ago. She had subjective fever last week which has since resolved. She is not had cough for the past 3 days however does continue to complain of abdomen and leg swelling and shortness of breath. Shortness of breath worse with lying supine improved with sitting up. Denies pain anywhere. Past Medical History  Diagnosis Date  . Asthma   . Hypertension   . Gout   . Arthritis   . Insomnia   . Chronic kidney disease   . Depression    Past Surgical History  Procedure Laterality Date  . Multiple tooth extractions    . Av fistula placement Left 01/28/2014    Procedure: ARTERIOVENOUS (AV) FISTULA CREATION;  Surgeon: Sherren Kerns, MD;  Location: [facility] OR;  Service: Vascular;  Laterality: Left;   Family History  Problem Relation Age of Onset  . Diabetes Mother   . Hypertension Mother   . Heart disease Mother   . Heart attack Mother   . Peripheral vascular disease Mother   . Diabetes Father   . Heart disease Father   . Hypertension Father   . Diabetes Sister   . Hypertension Sister   . Heart disease Sister     before age 58  . Heart attack Sister   . Peripheral vascular disease Sister   . Heart disease Brother   . Hyperlipidemia Daughter    History  Substance Use Topics  . Smoking status: Current Every Day Smoker -- 0.50 packs/day for 20 years    Types: Cigarettes  . Smokeless tobacco: Never Used  . Alcohol Use: Yes     Comment: occ   OB History    No data available     Review of Systems  Constitutional: Positive for fever.       Subjective fever  HENT: Negative.    Respiratory: Positive for cough and shortness of breath.   Cardiovascular: Positive for leg swelling.  Gastrointestinal: Positive for abdominal distention.  Musculoskeletal: Negative.   Skin: Negative.   Neurological: Negative.   Psychiatric/Behavioral: Negative.   All other systems reviewed and are negative.     Allergies  Review of patient's allergies indicates no known allergies.  Home Medications   Prior to Admission medications   Medication Sig Start Date End Date Taking? Authorizing Provider  amLODipine (NORVASC) 10 MG tablet Take 10 mg by mouth daily. 08/27/11 01/22/14  Azalia Bilis, MD  furosemide (LASIX) 80 MG tablet Take 80 mg by mouth daily.    Historical Provider, MD  metoprolol (LOPRESSOR) 50 MG tablet Take 50 mg by mouth 2 (two) times daily.    Historical Provider, MD  oxyCODONE (ROXICODONE) 5 MG immediate release tablet Take 1 tablet (5 mg total) by mouth every 6 (six) hours as needed for severe pain. 01/28/14   Raymond Gurney, PA-C  sodium bicarbonate 650 MG tablet Take 650 mg by mouth 3 (three) times daily.    Historical Provider, MD   BP 105/74 mmHg  Pulse 72  Temp(Src) 97.7 F (36.5 C) (Oral)  Resp 25  SpO2 92% Physical Exam  Constitutional: She appears well-developed and well-nourished. She  appears distressed.  Mild respiratory distress  HENT:  Head: Normocephalic and atraumatic.  Eyes: Conjunctivae are normal. Pupils are equal, round, and reactive to light.  Neck: Neck supple. JVD present. No tracheal deviation present. No thyromegaly present.  Cardiovascular: Normal rate and regular rhythm.   No murmur heard. Pulmonary/Chest: Breath sounds normal. She is in respiratory distress.  Speaks in sentences. Rales right base  Abdominal: Soft. Bowel sounds are normal. She exhibits no distension. There is no tenderness.  Obese  Musculoskeletal: Normal range of motion. She exhibits no edema or tenderness.  Fistula upper right arm with good thrill   Neurological: She is alert. Coordination normal.  Skin: Skin is warm and dry. No rash noted.  Psychiatric: She has a normal mood and affect.  Nursing note and vitals reviewed.   ED Course  Procedures (including critical care time) Labs Review Labs Reviewed - No data to display  Imaging Review No results found.   EKG Interpretation   Date/Time:  Wednesday June 17 2014 17:05:21 EDT Ventricular Rate:  72 PR Interval:  141 QRS Duration: 85 QT Interval:  425 QTC Calculation: 465 R Axis:   137 Text Interpretation:  Sinus rhythm Ventricular premature complex Aberrant  conduction of SV complex(es) Probable left atrial enlargement Low voltage  with right axis deviation Abnormal R-wave progression, late transition  Borderline ST depression, anterolateral leads Since last tracing rate  slower Confirmed by Ethelda Chick  MD, Nakaiya Beddow [phone]) on 06/17/2014 5:10:28 PM     Results for orders placed or performed during the hospital encounter of 06/17/14  CBC with Differential/Platelet  Result Value Ref Range   WBC 7.0 4.0 - 10.5 K/uL   RBC 4.75 3.87 - 5.11 MIL/uL   Hemoglobin 12.4 12.0 - 15.0 g/dL   HCT 19.1 47.8 - 29.5 %   MCV 83.6 78.0 - 100.0 fL   MCH 26.1 26.0 - 34.0 pg   MCHC 31.2 30.0 - 36.0 g/dL   RDW 62.1 (H) 30.8 - 65.7 %   Platelets 305 150 - 400 K/uL   Neutrophils Relative % 60 43 - 77 %   Neutro Abs 4.2 1.7 - 7.7 K/uL   Lymphocytes Relative 21 12 - 46 %   Lymphs Abs 1.5 0.7 - 4.0 K/uL   Monocytes Relative 18 (H) 3 - 12 %   Monocytes Absolute 1.3 (H) 0.1 - 1.0 K/uL   Eosinophils Relative 0 0 - 5 %   Eosinophils Absolute 0.0 0.0 - 0.7 K/uL   Basophils Relative 1 0 - 1 %   Basophils Absolute 0.1 0.0 - 0.1 K/uL  I-stat troponin, ED  Result Value Ref Range   Troponin i, poc 0.82 (HH) 0.00 - 0.08 ng/mL   Comment NOTIFIED PHYSICIAN    Comment 3           Dg Chest 2 View  06/17/2014   CLINICAL DATA:  Shortness of breath and chest tenderness for 1 week. Oxygen dependent.   EXAM: CHEST  2 VIEW  COMPARISON:  Chest radiograph 01/28/2014.  FINDINGS: Increasing cardiomegaly. No focal infiltrates or congestive failure. No effusion or pneumothorax. Bones unremarkable.  IMPRESSION: Increasing cardiomegaly.  No active infiltrates or failure.   Electronically Signed   By: Davonna Belling M.D.   On: 06/17/2014 17:18   Results for orders placed or performed during the hospital encounter of 06/17/14  CBC with Differential/Platelet  Result Value Ref Range   WBC 7.0 4.0 - 10.5 K/uL   RBC 4.75 3.87 - 5.11 MIL/uL  Hemoglobin 12.4 12.0 - 15.0 g/dL   HCT 16.1 09.6 - 04.5 %   MCV 83.6 78.0 - 100.0 fL   MCH 26.1 26.0 - 34.0 pg   MCHC 31.2 30.0 - 36.0 g/dL   RDW 40.9 (H) 81.1 - 91.4 %   Platelets 305 150 - 400 K/uL   Neutrophils Relative % 60 43 - 77 %   Neutro Abs 4.2 1.7 - 7.7 K/uL   Lymphocytes Relative 21 12 - 46 %   Lymphs Abs 1.5 0.7 - 4.0 K/uL   Monocytes Relative 18 (H) 3 - 12 %   Monocytes Absolute 1.3 (H) 0.1 - 1.0 K/uL   Eosinophils Relative 0 0 - 5 %   Eosinophils Absolute 0.0 0.0 - 0.7 K/uL   Basophils Relative 1 0 - 1 %   Basophils Absolute 0.1 0.0 - 0.1 K/uL  Basic metabolic panel  Result Value Ref Range   Sodium 138 135 - 145 mmol/L   Potassium 5.5 (H) 3.5 - 5.1 mmol/L   Chloride 107 96 - 112 mmol/L   CO2 18 (L) 19 - 32 mmol/L   Glucose, Bld 104 (H) 70 - 99 mg/dL   BUN 64 (H) 6 - 23 mg/dL   Creatinine, Ser 7.82 (H) 0.50 - 1.10 mg/dL   Calcium 8.2 (L) 8.4 - 10.5 mg/dL   GFR calc non Af Amer 8 (L) >90 mL/min   GFR calc Af Amer 9 (L) >90 mL/min   Anion gap 13 5 - 15  I-stat troponin, ED  Result Value Ref Range   Troponin i, poc 0.82 (HH) 0.00 - 0.08 ng/mL   Comment NOTIFIED PHYSICIAN    Comment 3           Dg Chest 2 View  06/17/2014   CLINICAL DATA:  Shortness of breath and chest tenderness for 1 week. Oxygen dependent.  EXAM: CHEST  2 VIEW  COMPARISON:  Chest radiograph 01/28/2014.  FINDINGS: Increasing cardiomegaly. No focal infiltrates or  congestive failure. No effusion or pneumothorax. Bones unremarkable.  IMPRESSION: Increasing cardiomegaly.  No active infiltrates or failure.   Electronically Signed   By: Davonna Belling M.D.   On: 06/17/2014 17:18    6:30 PM patient's appears comfortable sitting up in bed. Pulse oximetry 95% on oxygen nonrebreather mask, 10 L MDM  Elevated troponin may be secondary to congestive heart failure or renal failure however will cover with aspirin in light of possible NSTEMI. Patient has no EKG changes that are acute. Final diagnoses:  None   I spoke with Dr.Mcquade, intensive care unit who will evaluate patient for admission. I also consult did renal service Dr. Briant Cedar who suggests Lasix 160 mg IV, he will evaluate patient. Diagnosis #1 acute dyspnea #2 hypoxia #3 acute renal failure #4 hyperkalemia #5 hypotension Results for orders placed or performed during the hospital encounter of 06/17/14  CBC with Differential/Platelet  Result Value Ref Range   WBC 7.0 4.0 - 10.5 K/uL   RBC 4.75 3.87 - 5.11 MIL/uL   Hemoglobin 12.4 12.0 - 15.0 g/dL   HCT 95.6 21.3 - 08.6 %   MCV 83.6 78.0 - 100.0 fL   MCH 26.1 26.0 - 34.0 pg   MCHC 31.2 30.0 - 36.0 g/dL   RDW 57.8 (H) 46.9 - 62.9 %   Platelets 305 150 - 400 K/uL   Neutrophils Relative % 60 43 - 77 %   Neutro Abs 4.2 1.7 - 7.7 K/uL   Lymphocytes Relative 21 12 - 46 %  Lymphs Abs 1.5 0.7 - 4.0 K/uL   Monocytes Relative 18 (H) 3 - 12 %   Monocytes Absolute 1.3 (H) 0.1 - 1.0 K/uL   Eosinophils Relative 0 0 - 5 %   Eosinophils Absolute 0.0 0.0 - 0.7 K/uL   Basophils Relative 1 0 - 1 %   Basophils Absolute 0.1 0.0 - 0.1 K/uL  I-stat troponin, ED  Result Value Ref Range   Troponin i, poc 0.82 (HH) 0.00 - 0.08 ng/mL   Comment NOTIFIED PHYSICIAN    Comment 3           Dg Chest 2 View  06/17/2014   CLINICAL DATA:  Shortness of breath and chest tenderness for 1 week. Oxygen dependent.  EXAM: CHEST  2 VIEW  COMPARISON:  Chest radiograph 01/28/2014.   FINDINGS: Increasing cardiomegaly. No focal infiltrates or congestive failure. No effusion or pneumothorax. Bones unremarkable.  IMPRESSION: Increasing cardiomegaly.  No active infiltrates or failure.   Electronically Signed   By: Davonna BellingJohn  Curnes M.D.   On: 06/17/2014 17:18    CRITICAL CARE Performed by: Doug SouJACUBOWITZ,Murad Staples Total critical care time: 40 minutes Critical care time was exclusive of separately billable procedures and treating other patients. Critical care was necessary to treat or prevent imminent or life-threatening deterioration. Critical care was time spent personally by me on the following activities: development of treatment plan with patient and/or surrogate as well as nursing, discussions with consultants, evaluation of patient's response to treatment, examination of patient, obtaining history from patient or surrogate, ordering and performing treatments and interventions, ordering and review of laboratory studies, ordering and review of radiographic studies, pulse oximetry and re-evaluation of patient's condition.   Doug SouSam Ej Pinson, MD 06/17/14 [phone]

## 2014-06-17 NOTE — Consult Note (Signed)
[name] is an 59 y.o. 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  Pt followed by nephrologist at [facility], Dr Annitta Jersey [phone]).  Pt stopped taking her diuretics 2 weeks ago as she was inconvenienced with urinating when she was out and about.  Legs were swollen last week but better now.  Has hx asthma but cannot afford inhalers.  Scr in ER 5.5 which she thinks is higher than her baseline, but she is not sure (Scr 2.64 in 1/16).  Appetite poor last week that she relates to URI with sxs of nasal congestion and cough.  CXR not impressive for pulm edema  Past Medical History  Diagnosis Date  . Asthma   . Hypertension   . Gout   . Arthritis   . Insomnia   . Chronic kidney disease   . Depression     Past Surgical History  Procedure Laterality Date  . Multiple tooth extractions    . Av fistula placement Left 01/28/2014    Procedure: ARTERIOVENOUS (AV) FISTULA CREATION;  Surgeon: Sherren Kerns, MD;  Location: [facility] OR;  Service: Vascular;  Laterality: Left;    Family History  Problem Relation Age of Onset  . Diabetes Mother   . Hypertension Mother   . Heart disease Mother   . Heart attack Mother   . Peripheral vascular disease Mother   . Diabetes Father   . Heart disease Father   . Hypertension Father   . Diabetes Sister   . Hypertension Sister   . Heart disease Sister     before age 14  . Heart attack Sister   . Peripheral vascular disease Sister   . Heart disease Brother   . Hyperlipidemia Daughter   FH  Mother and sister both had ESRD sec DM  Social History:  reports that she has been smoking Cigarettes.  She has a 10 pack-year smoking history. She has never used smokeless tobacco. She reports that she drinks alcohol. She reports that she does not use illicit drugs.  Allergies: No Known Allergies  Medications Prior to Admission  Medication Sig  Dispense Refill  . allopurinol (ZYLOPRIM) 100 MG tablet Take 100 mg by mouth 2 (two) times daily as needed (for gout).    . citalopram (CELEXA) 20 MG tablet Take 20 mg by mouth daily.    . furosemide (LASIX) 80 MG tablet Take 80 mg by mouth daily.    . hydrOXYzine (ATARAX/VISTARIL) 25 MG tablet Take 25 mg by mouth 3 (three) times daily as needed for anxiety.    Marland Kitchen oxyCODONE (ROXICODONE) 5 MG immediate release tablet Take 1 tablet (5 mg total) by mouth every 6 (six) hours as needed for severe pain. (Patient not taking: Reported on 06/17/2014) 15 tablet 0     Lab Results: UA: ND   Recent Labs  06/17/14 1707  WBC 7.0  HGB 12.4  HCT 39.7  PLT 305   BMET  Recent Labs  06/17/14 1707  NA 138  K 5.5*  CL 107  CO2 18*  GLUCOSE 104*  BUN 64*  CREATININE 5.47*  CALCIUM 8.2*   LFT No results for input(s): PROT, ALBUMIN, AST, ALT, ALKPHOS, BILITOT, BILIDIR, IBILI in the last 72 hours. Dg Chest 2 View  06/17/2014   CLINICAL DATA:  Shortness of breath and chest tenderness for 1 week. Oxygen dependent.  EXAM:  CHEST  2 VIEW  COMPARISON:  Chest radiograph 01/28/2014.  FINDINGS: Increasing cardiomegaly. No focal infiltrates or congestive failure. No effusion or pneumothorax. Bones unremarkable.  IMPRESSION: Increasing cardiomegaly.  No active infiltrates or failure.   Electronically Signed   By: Davonna BellingJohn  Curnes M.D.   On: 06/17/2014 17:18    ROS: No change in vision + SOB No CP No Abd but some constipation No new arthritic CO No new neuropathic Sxs  PHYSICAL EXAM: Blood pressure 95/74, pulse 69, temperature 98.7 F (37.1 C), temperature source Rectal, resp. rate 20, SpO2 91 %. HEENT: PERRLA EOMI NECK:No JVD LUNGS:Diffuse bilateral wheezing CARDIAC:RRR wo MRG ABD:+ BS NTND No HSM EXT:0-tr edema,  LUA AVF + bruit NEURO:CNI OX# no asterixis, M&SI  Assessment: 1. 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  I'm not convinced her SOB is related to volume overload but more likely related to exacerbation of  asthma possibly from recent URI.  PE always a concern and if need to RO would do VQ scan and not CTA 2. Mild hyperkalemia  PLAN: 1. Bronchodilators 2. IV lasix which should help with the hyperkalemia 3. Check PTH 4. Daily Scr 5. Need to get info from [facility] 6. Renal diet 7. PO4 level 8. If renal fx dose not improve then may need HD but nothing urgent at this time    Meriel Kelliher T 06/17/2014, 10:15 PM

## 2014-06-18 ENCOUNTER — Inpatient Hospital Stay (HOSPITAL_COMMUNITY): Payer: Medicaid Other

## 2014-06-18 DIAGNOSIS — R0602 Shortness of breath: Secondary | ICD-10-CM

## 2014-06-18 DIAGNOSIS — J9601 Acute respiratory failure with hypoxia: Secondary | ICD-10-CM

## 2014-06-18 LAB — RENAL FUNCTION PANEL
Albumin: 2.9 g/dL — ABNORMAL LOW (ref 3.5–5.2)
Anion gap: 13 (ref 5–15)
BUN: 73 mg/dL — ABNORMAL HIGH (ref 6–23)
CHLORIDE: 103 mmol/L (ref 96–112)
CO2: 15 mmol/L — AB (ref 19–32)
Calcium: 7.8 mg/dL — ABNORMAL LOW (ref 8.4–10.5)
Creatinine, Ser: 5.86 mg/dL — ABNORMAL HIGH (ref 0.50–1.10)
GFR calc Af Amer: 8 mL/min — ABNORMAL LOW (ref >90)
GFR calc non Af Amer: 7 mL/min — ABNORMAL LOW (ref >90)
Glucose, Bld: 111 mg/dL — ABNORMAL HIGH (ref 70–99)
PHOSPHORUS: 7.8 mg/dL — AB (ref 2.3–4.6)
Potassium: 5.4 mmol/L — ABNORMAL HIGH (ref 3.5–5.1)
SODIUM: 131 mmol/L — AB (ref 135–145)

## 2014-06-18 LAB — BLOOD GAS, ARTERIAL
Acid-base deficit: 14 mmol/L — ABNORMAL HIGH (ref 0.0–2.0)
Bicarbonate: 15.5 mEq/L — ABNORMAL LOW (ref 20.0–24.0)
DRAWN BY: 331471
FIO2: 1 %
O2 Saturation: 88.7 %
PATIENT TEMPERATURE: 37
PCO2 ART: 51.5 mmHg — AB (ref 35.0–45.0)
TCO2: 15.1 mmol/L (ref 0–100)
pH, Arterial: 7.106 — CL (ref 7.350–7.450)
pO2, Arterial: 74.9 mmHg — ABNORMAL LOW (ref 80.0–100.0)

## 2014-06-18 LAB — CBC
HEMATOCRIT: 39.5 % (ref 36.0–46.0)
Hemoglobin: 12.6 g/dL (ref 12.0–15.0)
MCH: 26.4 pg (ref 26.0–34.0)
MCHC: 31.9 g/dL (ref 30.0–36.0)
MCV: 82.8 fL (ref 78.0–100.0)
PLATELETS: 320 10*3/uL (ref 150–400)
RBC: 4.77 MIL/uL (ref 3.87–5.11)
RDW: 19.5 % — ABNORMAL HIGH (ref 11.5–15.5)
WBC: 6.8 10*3/uL (ref 4.0–10.5)

## 2014-06-18 LAB — HEPARIN LEVEL (UNFRACTIONATED)
Heparin Unfractionated: 0.48 IU/mL (ref 0.30–0.70)
Heparin Unfractionated: 0.32 IU/mL (ref 0.30–0.70)

## 2014-06-18 LAB — INFLUENZA PANEL BY PCR (TYPE A & B)
H1N1 flu by pcr: DETECTED — AB
Influenza A By PCR: POSITIVE — AB
Influenza B By PCR: NEGATIVE

## 2014-06-18 LAB — HEPATITIS B SURFACE ANTIGEN: HEP B S AG: NEGATIVE

## 2014-06-18 LAB — CLOSTRIDIUM DIFFICILE BY PCR: Toxigenic C. Difficile by PCR: NEGATIVE

## 2014-06-18 LAB — TROPONIN I
Troponin I: 0.9 ng/mL (ref <0.031)
Troponin I: 0.69 ng/mL (ref <0.031)

## 2014-06-18 LAB — GLUCOSE, CAPILLARY: GLUCOSE-CAPILLARY: 162 mg/dL — AB (ref 70–99)

## 2014-06-18 LAB — LACTIC ACID, PLASMA
Lactic Acid, Venous: 1.7 mmol/L (ref 0.5–2.0)
Lactic Acid, Venous: 1.5 mmol/L (ref 0.5–2.0)

## 2014-06-18 MED ORDER — NEPRO/CARBSTEADY PO LIQD: 237.0000 mL | ORAL | Status: DC | PRN

## 2014-06-18 MED ORDER — LIDOCAINE-PRILOCAINE 2.5-2.5 % EX CREA: 1.0000 "application " | TOPICAL_CREAM | CUTANEOUS | Status: DC | PRN

## 2014-06-18 MED ORDER — SODIUM BICARBONATE 8.4 % IV SOLN
INTRAVENOUS | Status: DC
  Administered 2014-06-18 – 2014-06-19 (×2): via INTRAVENOUS
  Filled 2014-06-18 (×2): qty 150

## 2014-06-18 MED ORDER — HEPARIN (PORCINE) IN NACL 100-0.45 UNIT/ML-% IJ SOLN
1100.0000 [IU]/h | INTRAMUSCULAR | Status: DC
  Administered 2014-06-18: 1000 [IU]/h via INTRAVENOUS
  Filled 2014-06-18 (×3): qty 250

## 2014-06-18 MED ORDER — HEPARIN SODIUM (PORCINE) 1000 UNIT/ML DIALYSIS
20.0000 [IU]/kg | INTRAMUSCULAR | Status: DC | PRN
  Filled 2014-06-18: qty 3

## 2014-06-18 MED ORDER — HEPARIN SODIUM (PORCINE) 1000 UNIT/ML DIALYSIS
1000.0000 [IU] | INTRAMUSCULAR | Status: DC | PRN
  Filled 2014-06-18: qty 1

## 2014-06-18 MED ORDER — LIDOCAINE HCL (PF) 1 % IJ SOLN: 5.0000 mL | INTRAMUSCULAR | Status: DC | PRN

## 2014-06-18 MED ORDER — OSELTAMIVIR PHOSPHATE 30 MG PO CAPS
30.0000 mg | ORAL_CAPSULE | Freq: Every day | ORAL | Status: DC
  Administered 2014-06-18: 30 mg via ORAL
  Filled 2014-06-18 (×2): qty 1

## 2014-06-18 MED ORDER — PENTAFLUOROPROP-TETRAFLUOROETH EX AERO
1.0000 | INHALATION_SPRAY | CUTANEOUS | Status: DC | PRN
Start: 2014-06-18 — End: 2014-06-26

## 2014-06-18 MED ORDER — SODIUM POLYSTYRENE SULFONATE 15 GM/60ML PO SUSP
30.0000 g | Freq: Once | ORAL | Status: AC
  Administered 2014-06-18: 30 g via ORAL
  Filled 2014-06-18: qty 120

## 2014-06-18 MED ORDER — METHYLPREDNISOLONE SODIUM SUCC 40 MG IJ SOLR
40.0000 mg | Freq: Two times a day (BID) | INTRAMUSCULAR | Status: DC
  Administered 2014-06-18 – 2014-06-19 (×2): 40 mg via INTRAVENOUS
  Filled 2014-06-18 (×4): qty 1

## 2014-06-18 MED ORDER — SODIUM BICARBONATE 8.4 % IV SOLN
100.0000 meq | Freq: Once | INTRAVENOUS | Status: AC
  Administered 2014-06-18: 100 meq via INTRAVENOUS
  Filled 2014-06-18: qty 50

## 2014-06-18 MED ORDER — SODIUM CHLORIDE 0.9 % IV SOLN: 100.0000 mL | INTRAVENOUS | Status: DC | PRN

## 2014-06-18 MED ORDER — ALTEPLASE 2 MG IJ SOLR: 2.0000 mg | Freq: Once | INTRAMUSCULAR | Status: AC | PRN

## 2014-06-18 MED ORDER — FUROSEMIDE 10 MG/ML IJ SOLN
100.0000 mg | Freq: Four times a day (QID) | INTRAMUSCULAR | Status: DC
  Administered 2014-06-18: 100 mg via INTRAVENOUS
  Filled 2014-06-18 (×4): qty 10

## 2014-06-18 NOTE — Procedures (Signed)
Patient was seen on dialysis and the procedure was supervised. BFR 200 Via LUE AVF BP is 132/92.  Patient appears to be tolerating treatment well.  She is influenza A positive and this is the likely etiology of her hypoxia not volume overload as she has not pulmonary edema on CXR and no peripheral edema on exam.  AVF is suspect and may need to have HD catheter placed.  Will plan for HD again tomorrow and if unsuccessful will place catheter (tunneled if possible).

## 2014-06-18 NOTE — Progress Notes (Signed)
ABG noted - Mixed acidosis -predom metabolic WOB ok, although severely hypoxic Duplex neg for DVT Will give bicarb 2 amps push & start gtt [facility] to start HD soon, [facility] Can use bipap if WOB increases  Oretha MilchALVA,Neaveh Belanger V. MD

## 2014-06-18 NOTE — Progress Notes (Signed)
  Echocardiogram 2D Echocardiogram has been performed.  Leta JunglingCooper, Lynnmarie Lovett M 06/18/2014, 9:10 AM

## 2014-06-18 NOTE — Progress Notes (Signed)
Patient sats were in the low 70s, patient was placed back on  NRB along with Nasal Cannula, MD called, updated on patient, per MD goal O2 Sats greater than 85, PRN BIPAP if needed.  Sats are coming up.   Per CCM MD, will notify Renal MD and inform him that patient needs to be dialyze as soon as it can be arranged.  Patient has orders for V/Q scan, per CCM MD, patient needs to be dialyzed first.  Nuclear Medicine made aware and rescheduled for later.

## 2014-06-18 NOTE — Progress Notes (Signed)
Report given to ICU Nurse at [facility]. Patient is stable at transfer

## 2014-06-18 NOTE — Progress Notes (Signed)
VASCULAR LAB PRELIMINARY  PRELIMINARY  PRELIMINARY  PRELIMINARY  Bilateral lower extremity venous Dopplers completed.    Preliminary report:  There is no obvious evidence of DVT or SVT noted in the bilateral lower extremities. Doppler waveforms are pulsatile suggestive of fluid overload.   Ardit Danh, RVT 06/18/2014, 10:59 AM

## 2014-06-18 NOTE — Progress Notes (Signed)
ANTICOAGULATION CONSULT NOTE - FOLLOW UP    HL = 0.32 (goal 0.3 - 0.7 units/mL) Heparin dosing weight = 84 kg   Assessment: 58 YOF to continue on IV heparin for rule out ACS and PE.  Heparin level remains therapeutic but is trending down.  No bleeding reported.   Plan: - Increase heparin gtt to 1000 units/hr - F/U AM labs    Jhane Lorio D. Laney Potashang, PharmD, BCPS 06/18/2014, 4:34 PM

## 2014-06-18 NOTE — Progress Notes (Signed)
[facility] Progress Note    Assessment/ Plan:   Assessment: 1. 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 UOP very poor and bladder scan is negative for obstruction. Advanced CKD with a a lt BCF already. 2. 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 3. Hypertension 4. Gout 5. Depression 6. Asthma  PLAN: 1. Bronchodilators 2. No further diuretics given the poor response.  3. Check PTH 4. Daily Scr 5. Need to get info from [facility] 6. Renal diet 7. PO4 level 8. Patient with extremely poor UOP, poor response with the furosemide and creatinine continues to rise. Will initiate dialysis --> I don't think the left BCF is usable at the current time. Will need a fistulogram +/- plasty and possibly a tunneled dialysis catheter.  Subjective:   Still c/o dyspnea. She denies f/c/n/v. She also denies obstructive like symptoms.   Objective:   BP 114/80 mmHg  Pulse 80  Temp(Src) 97.4 F (36.3 C) (Axillary)  Resp 24  Ht 5\' 6"  (1.676 m)  Wt 106 kg (233 lb 11 oz)  BMI 37.74 kg/m2  SpO2 92%  Intake/Output Summary (Last 24 hours) at 06/18/14 1125 Last data filed at 06/18/14 0700  Gross per 24 hour  Intake  760.9 ml  Output      0 ml  Net  760.9 ml   Weight change:   Physical Exam: HENT: Normocephalic and atraumatic.  Eyes: Conjunctivae are normal. Pupils are equal, round, and reactive to light.  Neck: Neck supple. tracheal deviation present. No thyromegaly present.  Cardiovascular: Normal rate and regular rhythm.  No murmur heard. Pulmonary/Chest: Breath sounds normal. She has increased work of breathing.  Abdominal: Soft. Bowel sounds are normal. She exhibits no distension. There is no tenderness.  Musculoskeletal: Normal range of motion. She exhibits no edema or tenderness.  LUA BCF augments nicely only for a short segment. Neurological: She is alert. Coordination normal.   Skin: Skin is warm and dry. No rash noted.  Psychiatric: She has a normal mood and affect.   Imaging: Dg Chest 2 View  06/17/2014   CLINICAL DATA:  Shortness of breath and chest tenderness for 1 week. Oxygen dependent.  EXAM: CHEST  2 VIEW  COMPARISON:  Chest radiograph 01/28/2014.  FINDINGS: Increasing cardiomegaly. No focal infiltrates or congestive failure. No effusion or pneumothorax. Bones unremarkable.  IMPRESSION: Increasing cardiomegaly.  No active infiltrates or failure.   Electronically Signed   By: Davonna BellingJohn  Curnes M.D.   On: 06/17/2014 17:18    Labs: BMET  Recent Labs Lab 06/17/14 1707 06/18/14 0405  NA 138 131*  K 5.5* 5.4*  CL 107 103  CO2 18* 15*  GLUCOSE 104* 111*  BUN 64* 73*  CREATININE 5.47* 5.86*  CALCIUM 8.2* 7.8*  PHOS  --  7.8*   CBC  Recent Labs Lab 06/17/14 1707 06/18/14 0240  WBC 7.0 6.8  NEUTROABS 4.2  --   HGB 12.4 12.6  HCT 39.7 39.5  MCV 83.6 82.8  PLT 305 320    Medications:    . aspirin  324 mg Oral Daily   Or  . aspirin  300 mg Rectal Daily  . citalopram  20 mg Oral Daily  . furosemide  100 mg Intravenous Q6H  . ipratropium-albuterol  3 mL Nebulization Q6H  . methylPREDNISolone (SOLU-MEDROL) injection  60 mg Intravenous 3 times per day      Paulene FloorJames Christee Mervine, MD  06/18/2014, 11:25 AM

## 2014-06-18 NOTE — Progress Notes (Signed)
eLink Physician-Brief Progress Note Patient Name: [name] DOB: [birth year] MRN: [id number]   Date of Service  06/18/2014  HPI/Events of Note  No need for diuretics per renal, I agree  eICU Interventions  D/c lasix     Intervention Category Minor Interventions: Communication with other healthcare providers and/or family  Max FickleMCQUAID, DOUGLAS 06/18/2014, 5:16 PM

## 2014-06-18 NOTE — Progress Notes (Signed)
eLink Physician-Brief Progress Note Patient Name: [name] DOB: [birth year] MRN: [id number]   Date of Service  06/18/2014  HPI/Events of Note  Influenza positive  eICU Interventions  Tamiflu, renal dosing     Intervention Category Major Interventions: Infection - evaluation and management  MCQUAID, DOUGLAS 06/18/2014, 3:38 PM

## 2014-06-18 NOTE — Progress Notes (Addendum)
ANTICOAGULATION CONSULT NOTE - Follow Up  Pharmacy Consult for Heparin Indication: chest pain/ACS, r/o PE  No Known Allergies  Patient Measurements: Height: 5\' 6"  (167.6 cm) Weight: 233 lb 11 oz (106 kg) IBW/kg (Calculated) : 59.3 Heparin Dosing Weight: 84 kg  Vital Signs: Temp: 97.4 F (36.3 C) (03/17 0800) Temp Source: Axillary (03/17 0800) BP: 114/80 mmHg (03/17 0900) Pulse Rate: 80 (03/17 0900)  Labs:  Recent Labs  06/17/14 1707 06/17/14 2052 06/17/14 2301 06/18/14 0240 06/18/14 0405 06/18/14 0844  HGB 12.4  --   --  12.6  --   --   HCT 39.7  --   --  39.5  --   --   PLT 305  --   --  320  --   --   APTT  --   --  27  --   --   --   LABPROT  --   --  16.0*  --   --   --   INR  --   --  1.27  --   --   --   HEPARINUNFRC  --   --   --   --   --  0.48  CREATININE 5.47*  --   --   --  5.86*  --   TROPONINI  --  1.13*  --  0.90*  --  0.69*    Estimated Creatinine Clearance: 12.9 mL/min (by C-G formula based on Cr of 5.86).   Medical History: Past Medical History  Diagnosis Date  . Asthma   . Hypertension   . Gout   . Arthritis   . Insomnia   . Chronic kidney disease   . Depression     Medications:  Infusions:  . heparin 950 Units/hr (06/17/14 2348)    Assessment: [age] yoF with h/o asthma/COPD, HTN, CKD4 presents in renal failure with shortness of breath, leg swelling.  Pt reports not taking prescribed lasix in past two weeks.  Troponin 1.13, +D-dimer, needs VQ scan.  Pharmacy consulted to start heparin infusion.  [facility].  Renal function very poor with est CrCl<20 ml/min.  Of note,   Significant events: 3/17 Heparin bolus 4000 units x 1 then drip started at 950 units/hr.  1st heparin level therapeutic at 0.48.  No evidence of DVT on lower extremity dopplers.  SCr 7.8 CBC WNL  Goal of Therapy:  Heparin level 0.3-0.7 units/ml Monitor platelets by anticoagulation protocol: Yes   Plan:  1.  Continue heparin  drip at 950 units/hr. 2.  Recheck heparin level at 1600. 3.  Daily CBC while on heparin.    Clance Bollunyon, Eben Choinski 06/18/2014,11:38 AM

## 2014-06-18 NOTE — Progress Notes (Signed)
ANTICOAGULATION CONSULT NOTE - Initial Consult  Pharmacy Consult for Heparin Indication: chest pain/ACS  No Known Allergies  Patient Measurements: Height: 5\' 6"  (167.6 cm) Weight: 227 lb 11.8 oz (103.3 kg) IBW/kg (Calculated) : 59.3 Heparin Dosing Weight:   Vital Signs: Temp: 97.4 F (36.3 C) (03/17 0000) Temp Source: Axillary (03/17 0000) BP: 97/80 mmHg (03/17 0200) Pulse Rate: 85 (03/17 0300)  Labs:  Recent Labs  06/17/14 1707 06/17/14 2052 06/17/14 2301 06/18/14 0240  HGB 12.4  --   --  12.6  HCT 39.7  --   --  39.5  PLT 305  --   --  320  APTT  --   --  27  --   LABPROT  --   --  16.0*  --   INR  --   --  1.27  --   CREATININE 5.47*  --   --   --   TROPONINI  --  1.13*  --  0.90*    Estimated Creatinine Clearance: 13.6 mL/min (by C-G formula based on Cr of 5.47).   Medical History: Past Medical History  Diagnosis Date  . Asthma   . Hypertension   . Gout   . Arthritis   . Insomnia   . Chronic kidney disease   . Depression     Medications:  Infusions:  . heparin 950 Units/hr (06/17/14 2348)    Assessment: Patient with CKD, HTN admitted for SOB.  Patient with + CE (trop 1.13) and + D-dimer.  Patient's renal function is very poor, < 5120mL/min  Goal of Therapy:  Heparin level 0.3-0.7 units/ml Monitor platelets by anticoagulation protocol: Yes   Plan:  Heparin bolus 4000 units iv x1 Heparin drip at  950 units/hr Daily CBC Next heparin level at [address]    Alyssah Algeo Jr, Jacquenette ShoneJulian Crowford 06/18/2014,4:26 AM

## 2014-06-18 NOTE — Progress Notes (Signed)
CARE MANAGEMENT NOTE 06/18/2014  Patient:  [name],Lavaun C   Account Number:  [id number]  Date Initiated:  06/18/2014  Documentation initiated by:  DAVIS,RHONDA  Subjective/Objective Assessment:   resp. failure, acute renal failure, multiple system failure     Action/Plan:   [facility]   Anticipated DC Date:  06/18/2014   Anticipated DC Plan:  ACUTE TO ACUTE TRANS  In-house referral  NA      DC Planning Services  CM consult      PAC Choice  NA   Choice offered to / List presented to:  NA           Status of service:  In process, will continue to follow Medicare Important Message given?   (If response is "NO", the following Medicare IM given date fields will be blank) Date Medicare IM given:   Medicare IM given by:   Date Additional Medicare IM given:   Additional Medicare IM given by:    Discharge Disposition:  ACUTE TO ACUTE TRANS  Per UR Regulation:  Reviewed for med. necessity/level of care/duration of stay  If discussed at Long Length of Stay Meetings, dates discussed:    Comments:  June 18, 2014/Rhonda L. Earlene Plateravis, RN, BSN, CCM. Case Management [employer] [phone] No discharge needs present of time of review.

## 2014-06-19 ENCOUNTER — Inpatient Hospital Stay (HOSPITAL_COMMUNITY): Payer: Medicaid Other

## 2014-06-19 DIAGNOSIS — N185 Chronic kidney disease, stage 5: Secondary | ICD-10-CM

## 2014-06-19 LAB — COMPREHENSIVE METABOLIC PANEL
ALT: 209 U/L — AB (ref 0–35)
ANION GAP: 15 (ref 5–15)
AST: 246 U/L — ABNORMAL HIGH (ref 0–37)
Albumin: 2.4 g/dL — ABNORMAL LOW (ref 3.5–5.2)
Alkaline Phosphatase: 156 U/L — ABNORMAL HIGH (ref 39–117)
BUN: 60 mg/dL — ABNORMAL HIGH (ref 6–23)
CALCIUM: 7 mg/dL — AB (ref 8.4–10.5)
CO2: 22 mmol/L (ref 19–32)
CREATININE: 6.12 mg/dL — AB (ref 0.50–1.10)
Chloride: 97 mmol/L (ref 96–112)
GFR calc Af Amer: 8 mL/min — ABNORMAL LOW (ref >90)
GFR, EST NON AFRICAN AMERICAN: 7 mL/min — AB (ref >90)
GLUCOSE: 218 mg/dL — AB (ref 70–99)
Potassium: 4.3 mmol/L (ref 3.5–5.1)
Sodium: 134 mmol/L — ABNORMAL LOW (ref 135–145)
Total Bilirubin: 0.7 mg/dL (ref 0.3–1.2)
Total Protein: 6.3 g/dL (ref 6.0–8.3)

## 2014-06-19 LAB — CBC
HCT: 37.7 % (ref 36.0–46.0)
Hemoglobin: 11.8 g/dL — ABNORMAL LOW (ref 12.0–15.0)
MCH: 25.5 pg — ABNORMAL LOW (ref 26.0–34.0)
MCHC: 31.3 g/dL (ref 30.0–36.0)
MCV: 81.6 fL (ref 78.0–100.0)
Platelets: 298 10*3/uL (ref 150–400)
RBC: 4.62 MIL/uL (ref 3.87–5.11)
RDW: 19.5 % — AB (ref 11.5–15.5)
WBC: 8.5 10*3/uL (ref 4.0–10.5)

## 2014-06-19 LAB — GLUCOSE, CAPILLARY: GLUCOSE-CAPILLARY: 132 mg/dL — AB (ref 70–99)

## 2014-06-19 LAB — HEPARIN LEVEL (UNFRACTIONATED): Heparin Unfractionated: 0.28 IU/mL — ABNORMAL LOW (ref 0.30–0.70)

## 2014-06-19 LAB — PARATHYROID HORMONE, INTACT (NO CA): PTH: UNDETERMINED pg/mL

## 2014-06-19 MED ORDER — OSELTAMIVIR PHOSPHATE 30 MG PO CAPS
30.0000 mg | ORAL_CAPSULE | ORAL | Status: AC
  Administered 2014-06-19 – 2014-06-22 (×2): 30 mg via ORAL
  Filled 2014-06-19 (×2): qty 1

## 2014-06-19 MED ORDER — ASPIRIN 81 MG PO CHEW
81.0000 mg | CHEWABLE_TABLET | Freq: Every day | ORAL | Status: DC
  Administered 2014-06-19 – 2014-06-21 (×3): 81 mg via ORAL
  Filled 2014-06-19 (×3): qty 1

## 2014-06-19 MED ORDER — HEPARIN SODIUM (PORCINE) 5000 UNIT/ML IJ SOLN
5000.0000 [IU] | Freq: Three times a day (TID) | INTRAMUSCULAR | Status: DC
  Administered 2014-06-19 – 2014-07-01 (×30): 5000 [IU] via SUBCUTANEOUS
  Filled 2014-06-19 (×34): qty 1

## 2014-06-19 NOTE — Progress Notes (Signed)
[facility] Progress Note    Assessment: 1. 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 UOP very poor and bladder scan is negative for obstruction. Advanced CKD with a a lt BCF already. 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 Initiated on dialysis on 3/17. --> will plan on HD again today and then Monday. --> will request VIR angiogram of left BCF on Monday; i called down and they are very busy today. Saturday would need a call in and approval. --> No further diuretics given no response. 2. 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 3. Hypertension 4. Gout 5. Depression 6. Asthma -bronchodilators. Cardiac asthma? Feels a lot better after HD.      Subjective:   Feeling better today after dialysis yesterday; much less dyspnea. Denies f/c/n/v.   Objective:   BP 119/90 mmHg  Pulse 95  Temp(Src) 99.1 F (37.3 C) (Oral)  Resp 16  Ht 5\' 6"  (1.676 m)  Wt 105.7 kg (233 lb 0.4 oz)  BMI 37.63 kg/m2  SpO2 92%  Intake/Output Summary (Last 24 hours) at 06/19/14 0955 Last data filed at 06/19/14 0900  Gross per 24 hour  Intake 1802.58 ml  Output   1000 ml  Net 802.58 ml   Weight change: 2.4 kg (5 lb 4.7 oz)  Physical Exam: HENT: Normocephalic and atraumatic.  Eyes: Conjunctivae are normal. Pupils are equal, round, and reactive to light.  Neck: Neck supple. tracheal deviation present. No thyromegaly present.  Cardiovascular: Normal rate and regular rhythm.  No murmur heard. Pulmonary/Chest: Breath sounds normal. She has increased work of breathing.  Abdominal: Soft. Bowel sounds are normal. She exhibits no distension. There is no tenderness.  Musculoskeletal: Normal range of motion. She exhibits no edema or tenderness.  LUA BCF augments nicely only for a short segment. Neurological: She is alert. Coordination normal.  Skin: Skin is warm and dry. No rash noted.  Psychiatric:  She has a normal mood and affect.   Imaging: Dg Chest 2 View  06/17/2014   CLINICAL DATA:  Shortness of breath and chest tenderness for 1 week. Oxygen dependent.  EXAM: CHEST  2 VIEW  COMPARISON:  Chest radiograph 01/28/2014.  FINDINGS: Increasing cardiomegaly. No focal infiltrates or congestive failure. No effusion or pneumothorax. Bones unremarkable.  IMPRESSION: Increasing cardiomegaly.  No active infiltrates or failure.   Electronically Signed   By: Davonna BellingJohn  Curnes M.D.   On: 06/17/2014 17:18   [place] Renal  06/18/2014   CLINICAL DATA:  Patient with acute renal failure.  EXAM: RENAL/URINARY TRACT ULTRASOUND COMPLETE  COMPARISON:  None.  FINDINGS: Right Kidney:  Length: 8.4 cm. No hydronephrosis. Renal cortex is diffusely increased in echogenicity.  Left Kidney:  Length: 9.1 cm. There is a 1.9 x 1.7 x 1.5 cm hypoechoic mass off of the interpolar region of the left kidney.  Bladder:  Poorly distended.  Ascites within the pelvis.  IMPRESSION: Indeterminate 1.9 cm hypoechoic mass off the interpolar region of the left kidney. Evaluation is markedly limited due to body habitus. This may potentially represent a cyst however solid mass is not excluded. Recommend follow-up in the outpatient setting with either pre and post enhanced CT or MRI.  Echogenic right kidney most compatible with chronic medical renal disease.  No hydronephrosis.  Fluid within the pelvis.  These results will be called to the ordering clinician or representative by the Radiologist Assistant, and communication documented in the PACS  or zVision Dashboard.   Electronically Signed   By: Annia Belt M.D.   On: 06/18/2014 13:20    Labs: BMET  Recent Labs Lab 06/17/14 1707 06/18/14 0405 06/19/14 0232  NA 138 131* 134*  K 5.5* 5.4* 4.3  CL 107 103 97  CO2 18* 15* 22  GLUCOSE 104* 111* 218*  BUN 64* 73* 60*  CREATININE 5.47* 5.86* 6.12*  CALCIUM 8.2* 7.8* 7.0*  PHOS  --  7.8*  --    CBC  Recent Labs Lab 06/17/14 1707 06/18/14 0240  06/19/14 0232  WBC 7.0 6.8 8.5  NEUTROABS 4.2  --   --   HGB 12.4 12.6 11.8*  HCT 39.7 39.5 37.7  MCV 83.6 82.8 81.6  PLT 305 320 298    Medications:    . aspirin  324 mg Oral Daily   Or  . aspirin  300 mg Rectal Daily  . citalopram  20 mg Oral Daily  . ipratropium-albuterol  3 mL Nebulization Q6H  . methylPREDNISolone (SOLU-MEDROL) injection  40 mg Intravenous Q12H  . oseltamivir  30 mg Oral Daily      Paulene Floor, MD 06/19/2014, 9:55 AM

## 2014-06-19 NOTE — Progress Notes (Signed)
Subjective: Interval History: has no complaint .  Objective: Vital signs in last 24 hours: Temp:  [97.4 F (36.3 C)-98.2 F (36.8 C)] 97.5 F (36.4 C) (03/18 0347) Pulse Rate:  [73-102] 91 (03/18 0600) Resp:  [14-24] 16 (03/18 0600) BP: (82-131)/(52-99) 116/97 mmHg (03/18 0600) SpO2:  [84 %-100 %] 92 % (03/18 0600) FiO2 (%):  [100 %] 100 % (03/17 1500) Weight:  [105.7 kg (233 lb 0.4 oz)] 105.7 kg (233 lb 0.4 oz) (03/18 0311) Weight change: 2.4 kg (5 lb 4.7 oz)  Intake/Output from previous day: 03/17 0701 - 03/18 0700 In: 1742 [P.O.:150; I.V.:1592] Out: 1000  Intake/Output this shift:    General appearance: uncooperative and 0X1 Resp: diminished breath sounds bilaterally and wheezes bilaterally Cardio: S1, S2 normal and systolic murmur: holosystolic 2/6, blowing at apex GI: obese,posbs, liver down 4 cm Extremities: LUA AVF  Lab Results:  Recent Labs  06/18/14 0240 06/19/14 0232  WBC 6.8 8.5  HGB 12.6 11.8*  HCT 39.5 37.7  PLT 320 298   BMET:  Recent Labs  06/18/14 0405 06/19/14 0232  NA 131* 134*  K 5.4* 4.3  CL 103 97  CO2 15* 22  GLUCOSE 111* 218*  BUN 73* 60*  CREATININE 5.86* 6.12*  CALCIUM 7.8* 7.0*   No results for input(s): PTH in the last 72 hours. Iron Studies: No results for input(s): IRON, TIBC, TRANSFERRIN, FERRITIN in the last 72 hours.  Studies/Results: Dg Chest 2 View  06/17/2014   CLINICAL DATA:  Shortness of breath and chest tenderness for 1 week. Oxygen dependent.  EXAM: CHEST  2 VIEW  COMPARISON:  Chest radiograph 01/28/2014.  FINDINGS: Increasing cardiomegaly. No focal infiltrates or congestive failure. No effusion or pneumothorax. Bones unremarkable.  IMPRESSION: Increasing cardiomegaly.  No active infiltrates or failure.   Electronically Signed   By: Davonna BellingJohn  Curnes M.D.   On: 06/17/2014 17:18   [place] Renal  06/18/2014   CLINICAL DATA:  Patient with acute renal failure.  EXAM: RENAL/URINARY TRACT ULTRASOUND COMPLETE  COMPARISON:  None.   FINDINGS: Right Kidney:  Length: 8.4 cm. No hydronephrosis. Renal cortex is diffusely increased in echogenicity.  Left Kidney:  Length: 9.1 cm. There is a 1.9 x 1.7 x 1.5 cm hypoechoic mass off of the interpolar region of the left kidney.  Bladder:  Poorly distended.  Ascites within the pelvis.  IMPRESSION: Indeterminate 1.9 cm hypoechoic mass off the interpolar region of the left kidney. Evaluation is markedly limited due to body habitus. This may potentially represent a cyst however solid mass is not excluded. Recommend follow-up in the outpatient setting with either pre and post enhanced CT or MRI.  Echogenic right kidney most compatible with chronic medical renal disease.  No hydronephrosis.  Fluid within the pelvis.  These results will be called to the ordering clinician or representative by the Radiologist Assistant, and communication documented in the PACS or zVision Dashboard.   Electronically Signed   By: Annia Beltrew  Davis M.D.   On: 06/18/2014 13:20    I have reviewed the patient's current medications.  Assessment/Plan: 1 CKD 5 vol xs only mild. Acid/base ok 2 Anemia mild check Fe 3 HPTH  Check 4 Asthma primary issue 5 Influ on Meds 6 Obesity P HD, Asthma, check PTH, Fe    LOS: 2 days   Samson Ralph L 06/19/2014,7:25 AM

## 2014-06-19 NOTE — Consult Note (Addendum)
Stroke Consult    Chief Complaint: leg weakness HPI: [name] is an 59 y.o. female with a history of CKD and hypertension [facility] complaining of shortness of breath and generalized weakness x 2 weeks. Admitted with acute hypoxemic respiratory failure. During hospital stay noted to have weakness of her left side. 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    Past Medical History  Diagnosis Date  . Asthma   . Hypertension   . Gout   . Arthritis   . Insomnia   . Chronic kidney disease   . Depression     Past Surgical History  Procedure Laterality Date  . Multiple tooth extractions    . Av fistula placement Left 01/28/2014    Procedure: ARTERIOVENOUS (AV) FISTULA CREATION;  Surgeon: Sherren Kernsharles E Fields, MD;  [facility];  Service: Vascular;  Laterality: Left;    Family History  Problem Relation Age of Onset  . Diabetes Mother   . Hypertension Mother   . Heart disease Mother   . Heart attack Mother   . Peripheral vascular disease Mother   . Diabetes Father   . Heart disease Father   . Hypertension Father   . Diabetes Sister   . Hypertension Sister   . Heart disease Sister     before age 59  . Heart attack Sister   . Peripheral vascular disease Sister   . Heart disease Brother   . Hyperlipidemia Daughter    Social History:  reports that she has been smoking Cigarettes.  She has a 10 pack-year smoking history. She has never used smokeless tobacco. She reports that she drinks alcohol. She reports that she does not use illicit drugs.  Allergies: No Known Allergies  Medications Prior to Admission  Medication Sig Dispense Refill  . allopurinol (ZYLOPRIM) 100 MG tablet Take 100 mg by mouth 2 (two) times daily as needed (for gout).    . citalopram (CELEXA) 20 MG tablet Take 20 mg by mouth daily.    . furosemide (LASIX) 80 MG tablet Take 80 mg by mouth daily.    . hydrOXYzine  (ATARAX/VISTARIL) 25 MG tablet Take 25 mg by mouth 3 (three) times daily as needed for anxiety.    Marland Kitchen. oxyCODONE (ROXICODONE) 5 MG immediate release tablet Take 1 tablet (5 mg total) by mouth every 6 (six) hours as needed for severe pain. (Patient not taking: Reported on 06/17/2014) 15 tablet 0    ROS: Out of a complete 14 system review, the patient complains of only the following symptoms, and all other reviewed systems are negative.    Physical Examination: Filed Vitals:   06/19/14 1140  BP:   Pulse:   Temp: 97.6 F (36.4 C)  Resp:    Physical Exam  Constitutional: He appears well-developed and well-nourished.  Psych: Affect appropriate to situation Eyes: No scleral injection HENT: No OP obstrucion Head: Normocephalic.  Cardiovascular: Normal rate and regular rhythm.  Respiratory: coarse bilateral breath sounds GI: Soft. Bowel sounds are normal. No distension. There is no tenderness.  Skin: edematous  Neurologic Examination: Mental Status: Lethargic but responsive, oriented x 2. Follows simple commands. Mild dysarthria. No aphasia noted Cranial Nerves: II: optic discs not visualized, visual fields grossly normal, pupils equal, round, reactive to light III,IV, VI: ptosis not present, extra-ocular motions intact bilaterally V,VII: mild flattening of left NLF, facial light touch sensation normal bilaterally VIII: hearing  normal bilaterally IX,X: gag reflex present XI: trapezius strength/neck flexion strength normal bilaterally XII: tongue strength normal  Motor: RUE 5/5 strength Unable to lift LUE against gravity, distal 4-/5 Unable to lift bilateral proximal LE against gravity. Distal RLE 5-/5, distal LLE 4/5 Sensory:  light touch intact throughout, bilaterally Deep Tendon Reflexes: 1+ and symmetric throughout. Absent patellar and AJ (likely due to edema) Plantars: Right: equivocal with few beats of clonus   Left: equivocal few beats clonus Cerebellar: Unable to  test Gait: deferred  Laboratory Studies:   Basic Metabolic Panel:  Recent Labs Lab 06/17/14 1707 06/18/14 0405 06/19/14 0232  NA 138 131* 134*  K 5.5* 5.4* 4.3  CL 107 103 97  CO2 18* 15* 22  GLUCOSE 104* 111* 218*  BUN 64* 73* 60*  CREATININE 5.47* 5.86* 6.12*  CALCIUM 8.2* 7.8* 7.0*  PHOS  --  7.8*  --     Liver Function Tests:  Recent Labs Lab 06/18/14 0405 06/19/14 0232  AST  --  246*  ALT  --  209*  ALKPHOS  --  156*  BILITOT  --  0.7  PROT  --  6.3  ALBUMIN 2.9* 2.4*   No results for input(s): LIPASE, AMYLASE in the last 168 hours. No results for input(s): AMMONIA in the last 168 hours.  CBC:  Recent Labs Lab 06/17/14 1707 06/18/14 0240 06/19/14 0232  WBC 7.0 6.8 8.5  NEUTROABS 4.2  --   --   HGB 12.4 12.6 11.8*  HCT 39.7 39.5 37.7  MCV 83.6 82.8 81.6  PLT 305 320 298    Cardiac Enzymes:  Recent Labs Lab 06/17/14 2052 06/18/14 0240 06/18/14 0844  TROPONINI 1.13* 0.90* 0.69*    BNP: Invalid input(s): POCBNP  CBG:  Recent Labs Lab 06/18/14 1425  GLUCAP 162*    Microbiology: Results for orders placed or performed during the hospital encounter of 06/17/14  MRSA PCR Screening     Status: None   Collection Time: 06/17/14  9:29 PM  Result Value Ref Range Status   MRSA by PCR NEGATIVE NEGATIVE Final    Comment:        The GeneXpert MRSA Assay (FDA approved for NASAL specimens only), is one component of a comprehensive MRSA colonization surveillance program. It is not intended to diagnose MRSA infection nor to guide or monitor treatment for MRSA infections.   Clostridium Difficile by PCR     Status: None   Collection Time: 06/18/14  3:52 AM  Result Value Ref Range Status   C difficile by pcr NEGATIVE NEGATIVE Final    Coagulation Studies:  Recent Labs  06/17/14 2301  LABPROT 16.0*  INR 1.27    Urinalysis: No results for input(s): COLORURINE, LABSPEC, PHURINE, GLUCOSEU, HGBUR, BILIRUBINUR, KETONESUR, PROTEINUR,  UROBILINOGEN, NITRITE, LEUKOCYTESUR in the last 168 hours.  Invalid input(s): APPERANCEUR  Lipid Panel:  No results found for: CHOL, TRIG, HDL, CHOLHDL, VLDL, LDLCALC  HgbA1C: No results found for: HGBA1C  Urine Drug Screen:  No results found for: LABOPIA, COCAINSCRNUR, LABBENZ, AMPHETMU, THCU, LABBARB  Alcohol Level: No results for input(s): ETH in the last 168 hours.  Other results:  Imaging: Dg Chest 2 View  06/17/2014   CLINICAL DATA:  Shortness of breath and chest tenderness for 1 week. Oxygen dependent.  EXAM: CHEST  2 VIEW  COMPARISON:  Chest radiograph 01/28/2014.  FINDINGS: Increasing cardiomegaly. No focal infiltrates or congestive failure. No effusion or pneumothorax. Bones unremarkable.  IMPRESSION: Increasing cardiomegaly.  No active infiltrates or  failure.   Electronically Signed   By: Davonna Belling M.D.   On: 06/17/2014 17:18   Ct Head Wo Contrast  06/19/2014   CLINICAL DATA:  Left-sided weakness.  Dialysis patient.  EXAM: CT HEAD WITHOUT CONTRAST  TECHNIQUE: Contiguous axial images were obtained from the base of the skull through the vertex without intravenous contrast.  COMPARISON:  None  FINDINGS: Ill-defined hypodensity in the right medial frontal lobe consistent with acute infarct in the right anterior cerebral artery territory. No associated hemorrhage.  Negative for chronic ischemia. Ventricle size is normal. Negative for hemorrhage or mass lesion.  Calvarium intact.  Retention cyst in the left sphenoid sinus.  IMPRESSION: Acute infarct right anterior cerebral artery territory.  Critical Value/emergent results were called by telephone at the time of interpretation on 06/19/2014 at 12:53 pm to Dr. Billy Fischer , who verbally acknowledged these results.   Electronically Signed   By: Marlan Palau M.D.   On: 06/19/2014 12:53   US Renal  06/18/2014   CLINICAL DATA:  Patient with acute renal failure.  EXAM: RENAL/URINARY TRACT ULTRASOUND COMPLETE  COMPARISON:  None.  FINDINGS:  Right Kidney:  Length: 8.4 cm. No hydronephrosis. Renal cortex is diffusely increased in echogenicity.  Left Kidney:  Length: 9.1 cm. There is a 1.9 x 1.7 x 1.5 cm hypoechoic mass off of the interpolar region of the left kidney.  Bladder:  Poorly distended.  Ascites within the pelvis.  IMPRESSION: Indeterminate 1.9 cm hypoechoic mass off the interpolar region of the left kidney. Evaluation is markedly limited due to body habitus. This may potentially represent a cyst however solid mass is not excluded. Recommend follow-up in the outpatient setting with either pre and post enhanced CT or MRI.  Echogenic right kidney most compatible with chronic medical renal disease.  No hydronephrosis.  Fluid within the pelvis.  These results will be called to the ordering clinician or representative by the Radiologist Assistant, and communication documented in the PACS or zVision Dashboard.   Electronically Signed   By: Annia Belt M.D.   On: 06/18/2014 13:20    Assessment: 59 y.o. female hx of HTN, CKD admitted with respiratory failure noted to have left sided weakness. CT head confirms right ACA infarct. Will complete stroke workup.   Stroke Risk Factors - HTN, CKD  Plan: 1. HgbA1c, fasting lipid panel 2. MRI, MRA  of the brain without contrast 3. PT consult, OT consult, Speech consult 4. Echocardiogram 5. Carotid dopplers 6. Prophylactic therapy-ASA  daily 7. Risk factor modification 8. Telemetry monitoring 9. Frequent neuro checks   Elspeth Cho, DO [facility] [phone]  If 7pm- 7am, please page neurology on call as listed in AMION. 06/19/2014, 1:15 PM

## 2014-06-19 NOTE — Progress Notes (Signed)
ANTICOAGULATION CONSULT NOTE - Follow Up  Pharmacy Consult for Heparin Indication: chest pain/ACS, r/o PE  No Known Allergies  Patient Measurements: Height: 5\' 6"  (167.6 cm) Weight: 233 lb 0.4 oz (105.7 kg) IBW/kg (Calculated) : 59.3 Heparin Dosing Weight: 84 kg  Vital Signs: Temp: 97.5 F (36.4 C) (03/18 0347) Temp Source: Oral (03/18 0347) BP: 116/97 mmHg (03/18 0600) Pulse Rate: 91 (03/18 0600)  Labs:  Recent Labs  06/17/14 1707 06/17/14 2052 06/17/14 2301 06/18/14 0240 06/18/14 0405 06/18/14 0844 06/18/14 1545 06/19/14 0232  HGB 12.4  --   --  12.6  --   --   --  11.8*  HCT 39.7  --   --  39.5  --   --   --  37.7  PLT 305  --   --  320  --   --   --  298  APTT  --   --  27  --   --   --   --   --   LABPROT  --   --  16.0*  --   --   --   --   --   INR  --   --  1.27  --   --   --   --   --   HEPARINUNFRC  --   --   --   --   --  0.48 0.32 0.28*  CREATININE 5.47*  --   --   --  5.86*  --   --  6.12*  TROPONINI  --  1.13*  --  0.90*  --  0.69*  --   --     Estimated Creatinine Clearance: 12.3 mL/min (by C-G formula based on Cr of 6.12).   Medications:  Infusions:  . heparin 1,000 Units/hr (06/18/14 1900)    Assessment: [age] yoF with h/o asthma/COPD, HTN, CKD4 presents in renal failure with shortness of breath, leg swelling.  Pt reports not taking prescribed lasix in past two weeks.  Troponin 1.13, +D-dimer, needs VQ scan.  Pharmacy consulted to start heparin infusion.  Renal function very poor with est CrCl 12 ml/min.    Heparin level is slightly subtherapeutic at 0.28 on 1000 units/hr.  SCr 6.1 CBC stable  Goal of Therapy:  Heparin level 0.3-0.7 units/ml Monitor platelets by anticoagulation protocol: Yes   Plan:  1.  Increase heparin to 1100 units/hr. 2.  Daily CBC while on heparin.    Mickeal SkinnerFrens, Janielle Mittelstadt John 06/19/2014,7:53 AM

## 2014-06-19 NOTE — Progress Notes (Addendum)
Inpatient Diabetes Program Recommendations  AACE/ADA: New Consensus Statement on Inpatient Glycemic Control (2013)  Target Ranges:  Prepandial:   less than 140 mg/dL      Peak postprandial:   less than 180 mg/dL (1-2 hours)      Critically ill patients:  140 - 180 mg/dL   Results for [name], Renalda C (MRN [id number]) as of 06/19/2014 09:30  Ref. Range 06/17/2014 17:07 06/18/2014 04:05 06/19/2014 02:32  Glucose Latest Range: 70-99 mg/dL 409104 (H) 811111 (H) 914218 (H)    Diabetes history:  None  Current orders for Inpatient glycemic control: None  Inpatient Diabetes Program Recommendations Correction (SSI): Lab glucose elevated 218 mg/dl at [id number] this am. Pt on IV steroids. Please consider CBG's and Novolog 0-9 units (sensitive scale) ACHS.  Thanks,  Christena DeemShannon Kiylee Thoreson RN, MSN, [facility] Inpatient Diabetes Coordinator Team Pager [phone]

## 2014-06-19 NOTE — Progress Notes (Signed)
PULMONARY / CRITICAL CARE MEDICINE   Name: Jahaira C Turkington MRN: [id number] DOB: 1956/02/23    ADMISSION DATE:  06/17/2014 CONSULTATION DATE:  06/17/14  REFERRING MD :  Cathleen Fears  CHIEF COMPLAINT:  Shortness of breath  INITIAL PRESENTATION: 59 y/o female came to the [facility] on 3/16 with shortness of breath and acute hypoxemic respiratory failure felt to be due to volume overload.  MAJOR EVENTS/TEST RESULTS: 3/16 Admitted to [facility] ICU with dyspnea, hypoxemia. PMH of ESRD, recent fistula placed but not yet on HD. CXR with CM but no overt edema. ABG revealed uncompensated metabolic acidosis. Trop I minimally elevated and not thought to represent an ACS. D dimer elevated.  3/16 TTE: LVEF 60-65%. Moderate LVH. Grad 1 diastolic dysfunction.  3/16 Nephrology consultation performed. Plan transfer to [facility] for HD initiation 3/17 Renal US: no hydronephrosis. There is a 1.9 x 1.7 x 1.5 cm hypoechoic mass off of the interpolar region of the left kidney 3/17 LE venous duplex: No DVT 3/17 Transferred to [facility] ([place] Dyspnea much improved after HD. PASP estimated @ 38 mmHg. RV moderately dilated 3/18 Second session of HD ordered 3/18 L hemiparesis noted on exam. CT head: Acute infarct right anterior cerebral artery territory 3/18 Neurology consultation performed. Stroke W/U ordered  INDWELLING DEVICES::   MICRO DATA: MRSA PCR 3/17 >> NEG C dif 3/17 >> NEG Flu panel 3/17 >> POS for H1N1  ANTIMICROBIALS:  Oseltamivir 3/18 >>    SUBJECTIVE:  No resp distress. RN notes L sided weakness. No new complaints from pt   VITAL SIGNS: Temp:  [97.5 F (36.4 C)-99.1 F (37.3 C)] 97.6 F (36.4 C) (03/18 1140) Pulse Rate:  [73-132] 131 (03/18 1445) Resp:  [14-23] 16 (03/18 1445) BP: (82-131)/(52-99) 94/74 mmHg (03/18 1445) SpO2:  [84 %-100 %] 96 % (03/18 1445) Weight:  [105.7 kg (233 lb 0.4 oz)] 105.7 kg (233 lb 0.4 oz) (03/18 0311) HEMODYNAMICS:   VENTILATOR SETTINGS:   INTAKE /  OUTPUT:  Intake/Output Summary (Last 24 hours) at 06/19/14 1510 Last data filed at 06/19/14 0900  Gross per 24 hour  Intake 1727.58 ml  Output   1000 ml  Net 727.58 ml    PHYSICAL EXAMINATION: General: No distress Neuro:  Alert, oriented. CNs intact.   RUE, RLE strength normal. 2-3 beat R ankle clonus.   LUE motor 3/5, LLE motor 1-2/5, sustained L ankle clonus HEENT:  [place]/AT, PERRL Cardiovascular:  Reg, no M Lungs: dependent crackles Abdomen:  Obese, soft, non-tender Ext:  No LE edema  LABS: I have reviewed all of today's lab results. Relevant abnormalities are discussed in the A/P section  ASSESSMENT / PLAN:  PULMONARY A: Acute hypoxemic respiratory failure - likely volume overload +/- influenza pneumonitis Smoker - likely component of chronic airways disease Doubt PE given improvement with HD P:   Supp O2 to maintain SpO2 > 90% Canceled V/Q scan  Change full dose UFH to DVT prophy doses Recheck CXR 3/19  CARDIOVASCULAR A:  Troponin elevation, difficult to interpret in setting of renal failure Chronic hypertension, controlled Abnormal EKG P:  Recheck trop I in AM 3/19 Recheck EKG today Consider Cards consult if EKG demonstrates ischemia  RENAL A:   CKD/ESRD Met acidosis Hyperkalemia, resolved P:   Monitor BMET intermittently Monitor I/Os Correct electrolytes as indicated Nephrology follwoing  GASTROINTESTINAL A:   No acute issues P:   SUP N/I Cont renal diet  HEMATOLOGIC A:   Mild anemia without overt blood loss P:  DVT px: SQ heparin Monitor  CBC intermittently Transfuse per usual guidelines  INFECTIOUS A:   H1N1 positive P:   Monitor temp, WBC count Micro and abx as above  ENDOCRINE A:   No acute issues   P:   Monitor glucose on chem panels  NEUROLOGIC A:   Acute CVA P:   Further eval and mgmt per Neurology  Transfer to SDU ordered 3/18. TRH to assume 3/19 and PCCM to sign off. Discussed with Dr Nicolette Bang,  MD ; [facility] [phone].  After 5:30 PM or weekends, call [phone]

## 2014-06-20 ENCOUNTER — Inpatient Hospital Stay (HOSPITAL_COMMUNITY): Payer: Medicaid Other

## 2014-06-20 DIAGNOSIS — F32A Depression, unspecified: Secondary | ICD-10-CM | POA: Diagnosis present

## 2014-06-20 DIAGNOSIS — E669 Obesity, unspecified: Secondary | ICD-10-CM | POA: Diagnosis present

## 2014-06-20 DIAGNOSIS — F17200 Nicotine dependence, unspecified, uncomplicated: Secondary | ICD-10-CM | POA: Diagnosis present

## 2014-06-20 DIAGNOSIS — Z992 Dependence on renal dialysis: Secondary | ICD-10-CM

## 2014-06-20 DIAGNOSIS — I6330 Cerebral infarction due to thrombosis of unspecified cerebral artery: Secondary | ICD-10-CM | POA: Diagnosis present

## 2014-06-20 DIAGNOSIS — N189 Chronic kidney disease, unspecified: Secondary | ICD-10-CM | POA: Diagnosis present

## 2014-06-20 DIAGNOSIS — N186 End stage renal disease: Secondary | ICD-10-CM | POA: Diagnosis present

## 2014-06-20 DIAGNOSIS — N179 Acute kidney failure, unspecified: Secondary | ICD-10-CM | POA: Insufficient documentation

## 2014-06-20 DIAGNOSIS — N2889 Other specified disorders of kidney and ureter: Secondary | ICD-10-CM | POA: Diagnosis present

## 2014-06-20 DIAGNOSIS — F329 Major depressive disorder, single episode, unspecified: Secondary | ICD-10-CM | POA: Diagnosis present

## 2014-06-20 DIAGNOSIS — I5032 Chronic diastolic (congestive) heart failure: Secondary | ICD-10-CM | POA: Diagnosis present

## 2014-06-20 DIAGNOSIS — J101 Influenza due to other identified influenza virus with other respiratory manifestations: Secondary | ICD-10-CM | POA: Diagnosis present

## 2014-06-20 DIAGNOSIS — I10 Essential (primary) hypertension: Secondary | ICD-10-CM | POA: Diagnosis present

## 2014-06-20 LAB — IRON AND TIBC
Iron: 14 ug/dL — ABNORMAL LOW (ref 42–145)
Saturation Ratios: 5 % — ABNORMAL LOW (ref 20–55)
TIBC: 282 ug/dL (ref 250–470)
UIBC: 268 ug/dL (ref 125–400)

## 2014-06-20 LAB — COMPREHENSIVE METABOLIC PANEL
ALBUMIN: 2.5 g/dL — AB (ref 3.5–5.2)
ALK PHOS: 151 U/L — AB (ref 39–117)
ALT: 148 U/L — ABNORMAL HIGH (ref 0–35)
AST: 89 U/L — ABNORMAL HIGH (ref 0–37)
Anion gap: 14 (ref 5–15)
BILIRUBIN TOTAL: 0.8 mg/dL (ref 0.3–1.2)
BUN: 45 mg/dL — AB (ref 6–23)
CO2: 25 mmol/L (ref 19–32)
Calcium: 7.4 mg/dL — ABNORMAL LOW (ref 8.4–10.5)
Chloride: 95 mmol/L — ABNORMAL LOW (ref 96–112)
Creatinine, Ser: 5.77 mg/dL — ABNORMAL HIGH (ref 0.50–1.10)
GFR calc Af Amer: 8 mL/min — ABNORMAL LOW (ref >90)
GFR calc non Af Amer: 7 mL/min — ABNORMAL LOW (ref >90)
GLUCOSE: 125 mg/dL — AB (ref 70–99)
POTASSIUM: 3.8 mmol/L (ref 3.5–5.1)
Sodium: 134 mmol/L — ABNORMAL LOW (ref 135–145)
TOTAL PROTEIN: 6.4 g/dL (ref 6.0–8.3)

## 2014-06-20 LAB — HEPATITIS B SURFACE ANTIBODY,QUALITATIVE: Hep B S Ab: NONREACTIVE

## 2014-06-20 LAB — LIPID PANEL
Cholesterol: 156 mg/dL (ref 0–200)
HDL: 20 mg/dL — AB (ref >39)
LDL CALC: 77 mg/dL (ref 0–99)
Total CHOL/HDL Ratio: 7.8 RATIO
Triglycerides: 294 mg/dL — ABNORMAL HIGH (ref <150)
VLDL: 59 mg/dL — AB (ref 0–40)

## 2014-06-20 LAB — CBC
HEMATOCRIT: 38.2 % (ref 36.0–46.0)
Hemoglobin: 12.3 g/dL (ref 12.0–15.0)
MCH: 25.9 pg — ABNORMAL LOW (ref 26.0–34.0)
MCHC: 32.2 g/dL (ref 30.0–36.0)
MCV: 80.4 fL (ref 78.0–100.0)
Platelets: 289 10*3/uL (ref 150–400)
RBC: 4.75 MIL/uL (ref 3.87–5.11)
RDW: 19 % — ABNORMAL HIGH (ref 11.5–15.5)
WBC: 13.9 10*3/uL — ABNORMAL HIGH (ref 4.0–10.5)

## 2014-06-20 LAB — PHOSPHORUS: PHOSPHORUS: 6.5 mg/dL — AB (ref 2.3–4.6)

## 2014-06-20 LAB — HEPATITIS B CORE ANTIBODY, TOTAL: Hep B Core Total Ab: NEGATIVE

## 2014-06-20 LAB — GLUCOSE, CAPILLARY: GLUCOSE-CAPILLARY: 103 mg/dL — AB (ref 70–99)

## 2014-06-20 LAB — TSH: TSH: 1.472 u[IU]/mL (ref 0.350–4.500)

## 2014-06-20 LAB — PARATHYROID HORMONE, INTACT (NO CA): PTH: 531 pg/mL — ABNORMAL HIGH (ref 15–65)

## 2014-06-20 MED ORDER — IPRATROPIUM-ALBUTEROL 0.5-2.5 (3) MG/3ML IN SOLN
3.0000 mL | Freq: Three times a day (TID) | RESPIRATORY_TRACT | Status: DC
  Administered 2014-06-21 – 2014-07-01 (×20): 3 mL via RESPIRATORY_TRACT
  Filled 2014-06-20 (×28): qty 3

## 2014-06-20 MED ORDER — CETYLPYRIDINIUM CHLORIDE 0.05 % MT LIQD
7.0000 mL | Freq: Two times a day (BID) | OROMUCOSAL | Status: DC
  Administered 2014-06-20 – 2014-07-01 (×19): 7 mL via OROMUCOSAL

## 2014-06-20 NOTE — Progress Notes (Signed)
STROKE TEAM PROGRESS NOTE   HISTORY [name] is a 59 y.o. female with a history of CKD and hypertension came to the [facility] ED on 3/16 complaining of shortness of breath and generalized weakness x 2 weeks. Admitted with acute hypoxemic respiratory failure. During hospital stay noted to have weakness of her left side. 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   Patient was not administered TPA secondary to late presentation.   SUBJECTIVE (INTERVAL HISTORY) Her daughter is at the bedside.  Overall she feels her condition is unchanged. She still has left facial droop, left upper extremity weakness, and the left lower extremity weakness. She has cardiac cardiac on telemetry monitoring, EKG showed atrial arrhythmia. She has bilateral sustained ankle clonus. Denies any neck pain, back pain.   OBJECTIVE Temp:  [97.6 F (36.4 C)-99.1 F (37.3 C)] 97.7 F (36.5 C) (03/19 0347) Pulse Rate:  [80-141] 84 (03/19 0500) Cardiac Rhythm:  [-] Normal sinus rhythm (03/19 0000) Resp:  [16-39] 32 (03/19 0500) BP: (88-128)/(64-91) 120/71 mmHg (03/19 0500) SpO2:  [87 %-98 %] 92 % (03/19 0500) Weight:  [97.3 kg (214 lb 8.1 oz)] 97.3 kg (214 lb 8.1 oz) (03/19 0300)   Recent Labs Lab 06/18/14 1425 06/19/14 1542  GLUCAP 162* 132*    Recent Labs Lab 06/17/14 1707 06/18/14 0405 06/19/14 0232  NA 138 131* 134*  K 5.5* 5.4* 4.3  CL 107 103 97  CO2 18* 15* 22  GLUCOSE 104* 111* 218*  BUN 64* 73* 60*  CREATININE 5.47* 5.86* 6.12*  CALCIUM 8.2* 7.8* 7.0*  PHOS  --  7.8*  --     Recent Labs Lab 06/18/14 0405 06/19/14 0232  AST  --  246*  ALT  --  209*  ALKPHOS  --  156*  BILITOT  --  0.7  PROT  --  6.3  ALBUMIN 2.9* 2.4*    Recent Labs Lab 06/17/14 1707 06/18/14 0240 06/19/14 0232  WBC 7.0 6.8 8.5  NEUTROABS 4.2  --   --   HGB 12.4 12.6 11.8*  HCT 39.7 39.5 37.7  MCV 83.6 82.8 81.6  PLT 305 320 298     Recent Labs Lab 06/17/14 2052 06/18/14 0240 06/18/14 0844  TROPONINI 1.13* 0.90* 0.69*    Recent Labs  06/17/14 2301  LABPROT 16.0*  INR 1.27   No results for input(s): COLORURINE, LABSPEC, PHURINE, GLUCOSEU, HGBUR, BILIRUBINUR, KETONESUR, PROTEINUR, UROBILINOGEN, NITRITE, LEUKOCYTESUR in the last 72 hours.  Invalid input(s): APPERANCEUR  No results found for: CHOL, TRIG, HDL, CHOLHDL, VLDL, LDLCALC No results found for: HGBA1C No results found for: LABOPIA, COCAINSCRNUR, LABBENZ, AMPHETMU, THCU, LABBARB  No results for input(s): ETH in the last 168 hours.   I have personally reviewed the radiological images below and agree with the radiology interpretations.  Ct Head Wo Contrast 06/19/2014    Acute infarct right anterior cerebral artery territory.    US Renal 06/18/2014    Indeterminate 1.9 cm hypoechoic mass off the interpolar region of the left kidney. Evaluation is markedly limited due to body habitus. This may potentially represent a cyst however solid mass is not excluded. Recommend follow-up in the outpatient setting with either pre and post enhanced CT or MRI.  Echogenic right kidney most compatible with chronic medical renal disease.  No hydronephrosis.  Fluid within the pelvis.    2-D echo - Normal LV function; grade 1 diastolic dysfunction; systolic and diastolic septal  flattening; moderate RVE with severely reduced function; severe RAE with right to left septal bowing; severe TR; findings consistent with severely elevated pulmonary pressure.  LE venous Doppler - There is no obvious evidence of DVT or SVT noted in the bilateral lower extremities. Doppler waveforms are pulsatile suggestive of fluid overload.  MRI and MRA pending  CUS pending   PHYSICAL EXAM  Temp:  [97.7 F (36.5 C)-98.5 F (36.9 C)] 98.2 F (36.8 C) (03/19 0800) Pulse Rate:  [80-141] 102 (03/19 1200) Resp:  [16-39] 20 (03/19 1200) BP: (88-136)/(54-104) 136/104 mmHg (03/19  1200) SpO2:  [87 %-99 %] 91 % (03/19 1200) Weight:  [214 lb 8.1 oz (97.3 kg)] 214 lb 8.1 oz (97.3 kg) (03/19 0300)  General - obese, well developed, in no apparent distress, lethargic.  Ophthalmologic - fundi not visualized due to incorporation.  Cardiovascular - irregularly irregular rate and rhythm.  Mental Status -  Level of arousal and orientation to months, place, and person were intact, but not to year. Language including expression, naming, repetition, comprehension was assessed and found intact. Fund of Knowledge was assessed and was impaired, not knowing previous presidents.  Cranial Nerves II - XII - II - Visual field intact OU. III, IV, VI - Extraocular movements intact. V - Facial sensation intact bilaterally. VII - left facial droop. VIII - Hearing & vestibular intact bilaterally. X - Palate elevates symmetrically. XI - Chin turning & shoulder shrug intact bilaterally. XII - Tongue protrusion intact.  Motor Strength - The patient's strength was 3+/5 LUE and 0/5 but without pain at LLE. RUE and RLE 5-/5. Bulk was normal and fasciculations were absent.   Motor Tone - Muscle tone was assessed at the neck and appendages and was normal.  Reflexes - The patient's reflexes were 3+ bilateral biceps and patellar reflexes, bilateral sustained ankle clonus and she had no pathological reflexes.  Sensory - Light touch, temperature/pinprick were assessed and were normal.    Coordination - The patient had normal movements in the right hand with no ataxia or dysmetria.  Tremor was absent.  Gait and Station - not tested due to safety concerns.  ASSESSMENT/PLAN [name] is a 59 y.o. female with history of HTN, ESRD recently on HD, COPD presenting with generalized weakness and fluid overload. Started on hemodialysis. However she was found to have left-sided weakness after admission. CT head concerning for right ACA infarct.   Stroke:  Non-dominant right ACA infarct, etiology  not clear, embolic vs. large vessel athero  Resultant  left facial droop, left hemiparesis  MRI  pending  MRA  pending  Carotid Doppler  pending  2D Echo  unremarkable  LE Venous Doppler no DVT  LDL pending  HgbA1c pending  Heparin sq for VTE prophylaxis  Diet renal W/1264mL fluid restriction  Diet NPO time specified   no antithrombotic prior to admission, now on aspirin 81 mg orally every day  Patient counseled to be compliant with her antithrombotic medications  Ongoing aggressive stroke risk factor management  Some hypercoagulable labs pending  Therapy recommendations:  Pending  Disposition:  Pending  ESRD recently on HD  Nephrology on board  On hemodialysis  Flu  Droplet isolation  On Tamiflu  Hypertension  Home meds:   Lasix  Stable  Permissive hypertension (OK if <220/120) for 24-48 hours post stroke and then gradually normalized within 5-7 days.  Patient counseled to be compliant with her blood pressure medications  Other Stroke Risk Factors  Advanced age  Morbid obesity, Body mass index is 34.64 kg/(m^2).   COPD with CO2 retention  Other Active Problems  Leukocytosis  Other Pertinent History  Hyponatremia  Hospital day # 3  I, the attending vascular neurologist, have personally obtained a history, examined the patient, evaluated laboratory data, individually viewed imaging studies and agree with radiology interpretations. I also obtained additional history from pt's daughter at bedside. I also discussed with Dr. Rito EhrlichKrishnan regarding her care plan.   Marvel PlanJindong Paije Goodhart, MD PhD Stroke Neurology 06/20/2014 2:53 PM   To contact Stroke Continuity provider, please refer to [email or web address]. After hours, contact General Neurology

## 2014-06-20 NOTE — Progress Notes (Signed)
[facility] Progress Note    Assessment/ Plan:   1. 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 UOP very poor and bladder scan is negative for obstruction. Advanced CKD with a a lt BCF already. 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 Initiated on dialysis on 3/17. HD #2 on 3/18. --> will plan on HD again Monday after fistulogram (there are issues with cannulation on dialysis). --> will request VIR angiogram of left BCF on Monday; i called down Fri and the schedule was booked solid.  Saturday would need a call in and approval but this is not an emergency. --> No further diuretics given no response. --> I am not convinced she will do well long term on dialysis; she has poor performance status. 2. 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 3. Hypertension 4. Gout 5. Depression 6. Asthma -bronchodilators. Cardiac asthma? Feels a lot better after HD.  Subjective:   She c/o weakness and fatigue. Breathing is improved. Poor appetite.   Objective:   BP 120/71 mmHg  Pulse 84  Temp(Src) 97.7 F (36.5 C) (Oral)  Resp 32  Ht 5\' 6"  (1.676 m)  Wt 97.3 kg (214 lb 8.1 oz)  BMI 34.64 kg/m2  SpO2 92%  Intake/Output Summary (Last 24 hours) at 06/20/14 0758 Last data filed at 06/19/14 1800  Gross per 24 hour  Intake  60.58 ml  Output    200 ml  Net -139.42 ml   Weight change: -8.4 kg (-18 lb 8.3 oz)  Physical Exam: HENT: Normocephalic and atraumatic.  Eyes: Conjunctivae are normal. Pupils are equal, round, and reactive to light.  Neck: Neck supple. tracheal deviation present. No thyromegaly present.  Cardiovascular: Normal rate and regular rhythm.  No murmur heard. Pulmonary/Chest: Breath sounds normal. She has increased work of breathing.  Abdominal: Soft. Bowel sounds are normal. She exhibits no distension. There is no tenderness.  Musculoskeletal: Normal range of motion. She  exhibits no edema or tenderness.  LUA BCF augments nicely only for a short segment. Neurological: She is alert. Coordination normal.  Skin: Skin is warm and dry. No rash noted.  Psychiatric: She has a normal mood and affect.   Imaging: Ct Head Wo Contrast  06/19/2014   CLINICAL DATA:  Left-sided weakness.  Dialysis patient.  EXAM: CT HEAD WITHOUT CONTRAST  TECHNIQUE: Contiguous axial images were obtained from the base of the skull through the vertex without intravenous contrast.  COMPARISON:  None  FINDINGS: Ill-defined hypodensity in the right medial frontal lobe consistent with acute infarct in the right anterior cerebral artery territory. No associated hemorrhage.  Negative for chronic ischemia. Ventricle size is normal. Negative for hemorrhage or mass lesion.  Calvarium intact.  Retention cyst in the left sphenoid sinus.  IMPRESSION: Acute infarct right anterior cerebral artery territory.  Critical Value/emergent results were called by telephone at the time of interpretation on 06/19/2014 at 12:53 pm to Dr. Billy FischerAVID SIMONDS , who verbally acknowledged these results.   Electronically Signed   By: Marlan Palauharles  Clark M.D.   On: 06/19/2014 12:53   [place] Renal  06/18/2014   CLINICAL DATA:  Patient with acute renal failure.  EXAM: RENAL/URINARY TRACT ULTRASOUND COMPLETE  COMPARISON:  None.  FINDINGS: Right Kidney:  Length: 8.4 cm. No hydronephrosis. Renal cortex is diffusely increased in echogenicity.  Left Kidney:  Length: 9.1 cm. There is a 1.9 x 1.7 x 1.5 cm hypoechoic mass off of  the interpolar region of the left kidney.  Bladder:  Poorly distended.  Ascites within the pelvis.  IMPRESSION: Indeterminate 1.9 cm hypoechoic mass off the interpolar region of the left kidney. Evaluation is markedly limited due to body habitus. This may potentially represent a cyst however solid mass is not excluded. Recommend follow-up in the outpatient setting with either pre and post enhanced CT or MRI.  Echogenic right kidney most  compatible with chronic medical renal disease.  No hydronephrosis.  Fluid within the pelvis.  These results will be called to the ordering clinician or representative by the Radiologist Assistant, and communication documented in the PACS or zVision Dashboard.   Electronically Signed   By: Annia Belt M.D.   On: 06/18/2014 13:20    Labs: BMET  Recent Labs Lab 06/17/14 1707 06/18/14 0405 06/19/14 0232  NA 138 131* 134*  K 5.5* 5.4* 4.3  CL 107 103 97  CO2 18* 15* 22  GLUCOSE 104* 111* 218*  BUN 64* 73* 60*  CREATININE 5.47* 5.86* 6.12*  CALCIUM 8.2* 7.8* 7.0*  PHOS  --  7.8*  --    CBC  Recent Labs Lab 06/17/14 1707 06/18/14 0240 06/19/14 0232  WBC 7.0 6.8 8.5  NEUTROABS 4.2  --   --   HGB 12.4 12.6 11.8*  HCT 39.7 39.5 37.7  MCV 83.6 82.8 81.6  PLT 305 320 298    Medications:    . antiseptic oral rinse  7 mL Mouth Rinse BID  . aspirin  81 mg Oral Daily  . citalopram  20 mg Oral Daily  . heparin subcutaneous  5,000 Units Subcutaneous 3 times per day  . ipratropium-albuterol  3 mL Nebulization Q6H  . oseltamivir  30 mg Oral Q M,W,F-1800      Paulene Floor, MD 06/20/2014, 7:58 AM

## 2014-06-20 NOTE — Progress Notes (Signed)
PROGRESS NOTE  Erine C Lheureux ZOX:[id number] DOB: December 26, 1955 DOA: 06/17/2014 PCP: [name], MD  HPI/Recap of past 24 hours: Patient is a 59 year old female with past mental history of stage V chronic kidney disease status post fistula placement although not yet on hemodialysis as well as hypertension, tobacco use and obesity who was admitted at [facility] on 3/16 for shortness of breath and weakness 2 weeks. Patient admitted to [facility] service and found to have uncompensated metabolic acidosis from worsening uremia, H1N1 flu. Renal ultrasound done noted mass off of left kidney. Patient transferred to [facility] for dialysis which greatly improved her breathing. On 3/18 patient noted to be hemiparetic on the left side and a CT scan noted an infarct of the right ACA and neurology consulted. Patient since stabilized and transferred to step down unit to hospitalist service.  Assessment/Plan: Principal Problem:   Acute respiratory failure with hypoxia: Still requiring significant amount of oxygen support at 6 L. Chest x-ray clear. Likely this is multifactorial from influenza, chronic tobacco use and volume overload  Active Problems:   Cerebral thrombosis with cerebral infarction: Seen by neurology today. Stroke workup in progress including MRI, Dopplers; echo already done. Started on daily aspirin   Hypertension: Continued on home medications.    ESRD needing dialysis: Seen by nephrology. They feel that long-term she may be a poor candidate for continuing dialysis. Status post 2 sessions.   Obesity (BMI 30-39.9): Patient is criteria with BMI greater than 30.    Depression: Continue SSRI    Left renal mass: Once patient more stabilized, will check dedicated renal MRI    Chronic diastolic heart failure: Noted on echocardiogram. Grade 1. Managed by dialysis.    H1N1 influenza: Positive screen started on Tamiflu    Tobacco use disorder: Counseled   Code Status: Full code  Family  Communication: Spoke with daughter at the bedside  Disposition Plan: Continue in stepdown. Would like to see oxygen requirements decreased before transferring to floor. Long-term decisions need to be made about dialysis.   Consultants:  Nephrology  Critical care  Neurology  Procedures:  Echocardiogram done 3/17 noting grade 1 diastolic dysfunction  Antibiotics: Tamiflu 3/18-present  Objective: BP 136/104 mmHg  Pulse 102  Temp(Src) 97.8 F (36.6 C) (Oral)  Resp 20  Ht  (1.676 m)  Wt 97.3 kg (214 lb 8.1 oz)  BMI 34.64 kg/m2  SpO2 91%  Intake/Output Summary (Last 24 hours) at 06/20/14 1433 Last data filed at 06/19/14 1800  Gross per 24 hour  Intake      0 ml  Output    200 ml  Net   -200 ml   Filed Weights   06/18/14 0400 06/19/14 0311 06/20/14 0300  Weight: 106 kg (233 lb 11 oz) 105.7 kg (233 lb 0.4 oz) 97.3 kg (214 lb 8.1 oz)    Exam:   General:  Alert and oriented 3, fatigued, otherwise no acute distress  Cardiovascular: Regular rate and rhythm, S1-S2, 2/6 systolic ejection murmur  Respiratory: Decreased breath sounds throughout, no crackles  Abdomen: Soft, obese, nontender, hypoactive bowel sounds  Musculoskeletal: No clubbing or cyanosis, 1+ pitting edema bilaterally  Neurologic: Noted left side hemiparesis   Data Reviewed: Basic Metabolic Panel:  Recent Labs Lab 06/17/14 1707 06/18/14 0405 06/19/14 0232 06/20/14 0850  NA 138 131* 134* 134*  K 5.5* 5.4* 4.3 3.8  CL 107 103 97 95*  CO2 18* 15* 22 25  GLUCOSE 104* 111* 218* 125*  BUN 64* 73*  60* 45*  CREATININE 5.47* 5.86* 6.12* 5.77*  CALCIUM 8.2* 7.8* 7.0* 7.4*  PHOS  --  7.8*  --  6.5*   Liver Function Tests:  Recent Labs Lab 06/18/14 0405 06/19/14 0232 06/20/14 0850  AST  --  246* 89*  ALT  --  209* 148*  ALKPHOS  --  156* 151*  BILITOT  --  0.7 0.8  PROT  --  6.3 6.4  ALBUMIN 2.9* 2.4* 2.5*   No results for input(s): LIPASE, AMYLASE in the last 168 hours. No  results for input(s): AMMONIA in the last 168 hours. CBC:  Recent Labs Lab 06/17/14 1707 06/18/14 0240 06/19/14 0232 06/20/14 0850  WBC 7.0 6.8 8.5 13.9*  NEUTROABS 4.2  --   --   --   HGB 12.4 12.6 11.8* 12.3  HCT 39.7 39.5 37.7 38.2  MCV 83.6 82.8 81.6 80.4  PLT 305 320 298 289   Cardiac Enzymes:    Recent Labs Lab 06/17/14 2052 06/18/14 0240 06/18/14 0844  TROPONINI 1.13* 0.90* 0.69*   BNP (last 3 results)  Recent Labs  06/17/14 2043  BNP 1378.8*    ProBNP (last 3 results) No results for input(s): PROBNP in the last 8760 hours.  CBG:  Recent Labs Lab 06/18/14 1425 06/19/14 1542  GLUCAP 162* 132*    Recent Results (from the past 240 hour(s))  MRSA PCR Screening     Status: None   Collection Time: 06/17/14  9:29 PM  Result Value Ref Range Status   MRSA by PCR NEGATIVE NEGATIVE Final    Comment:        The GeneXpert MRSA Assay (FDA approved for NASAL specimens only), is one component of a comprehensive MRSA colonization surveillance program. It is not intended to diagnose MRSA infection nor to guide or monitor treatment for MRSA infections.   Clostridium Difficile by PCR     Status: None   Collection Time: 06/18/14  3:52 AM  Result Value Ref Range Status   C difficile by pcr NEGATIVE NEGATIVE Final     Studies: Ct Head Wo Contrast  06/19/2014   CLINICAL DATA:  Left-sided weakness.  Dialysis patient.  EXAM: CT HEAD WITHOUT CONTRAST  TECHNIQUE: Contiguous axial images were obtained from the base of the skull through the vertex without intravenous contrast.  COMPARISON:  None  FINDINGS: Ill-defined hypodensity in the right medial frontal lobe consistent with acute infarct in the right anterior cerebral artery territory. No associated hemorrhage.  Negative for chronic ischemia. Ventricle size is normal. Negative for hemorrhage or mass lesion.  Calvarium intact.  Retention cyst in the left sphenoid sinus.  IMPRESSION: Acute infarct right anterior  cerebral artery territory.  Critical Value/emergent results were called by telephone at the time of interpretation on 06/19/2014 at 12:53 pm to Dr. Billy FischerAVID SIMONDS , who verbally acknowledged these results.   Electronically Signed   By: Marlan Palauharles  Clark M.D.   On: 06/19/2014 12:53    Scheduled Meds: . antiseptic oral rinse  7 mL Mouth Rinse BID  . aspirin  81 mg Oral Daily  . citalopram  20 mg Oral Daily  . heparin subcutaneous  5,000 Units Subcutaneous 3 times per day  . ipratropium-albuterol  3 mL Nebulization Q6H  . oseltamivir  30 mg Oral Q M,W,F-1800    Continuous Infusions:    Time spent: 35 minutes  Hollice EspyKRISHNAN,Darnette Lampron K  [facility] Pager [phone]. If 7PM-7AM, please contact night-coverage at [email or web address], password [email or web address] 06/20/2014, 2:33 PM  LOS: 3 days

## 2014-06-21 DIAGNOSIS — E785 Hyperlipidemia, unspecified: Secondary | ICD-10-CM | POA: Diagnosis present

## 2014-06-21 DIAGNOSIS — J441 Chronic obstructive pulmonary disease with (acute) exacerbation: Secondary | ICD-10-CM

## 2014-06-21 LAB — TROPONIN I: Troponin I: 0.68 ng/mL (ref <0.031)

## 2014-06-21 LAB — GLUCOSE, CAPILLARY
GLUCOSE-CAPILLARY: 91 mg/dL (ref 70–99)
GLUCOSE-CAPILLARY: 78 mg/dL (ref 70–99)
Glucose-Capillary: 107 mg/dL — ABNORMAL HIGH (ref 70–99)
Glucose-Capillary: 123 mg/dL — ABNORMAL HIGH (ref 70–99)

## 2014-06-21 LAB — VITAMIN B12

## 2014-06-21 LAB — T4, FREE: Free T4: 1 ng/dL (ref 0.80–1.80)

## 2014-06-21 MED ORDER — ATORVASTATIN CALCIUM 10 MG PO TABS
10.0000 mg | ORAL_TABLET | Freq: Every day | ORAL | Status: DC
  Administered 2014-06-21 – 2014-06-22 (×2): 10 mg via ORAL
  Filled 2014-06-21 (×2): qty 1

## 2014-06-21 MED ORDER — METHYLPREDNISOLONE SODIUM SUCC 40 MG IJ SOLR
40.0000 mg | Freq: Two times a day (BID) | INTRAMUSCULAR | Status: DC
  Administered 2014-06-21 – 2014-06-23 (×5): 40 mg via INTRAVENOUS
  Filled 2014-06-21 (×6): qty 1

## 2014-06-21 MED ORDER — CALCIUM ACETATE (PHOS BINDER) 667 MG PO CAPS
667.0000 mg | ORAL_CAPSULE | Freq: Three times a day (TID) | ORAL | Status: DC
  Administered 2014-06-21 – 2014-06-30 (×17): 667 mg via ORAL
  Filled 2014-06-21 (×33): qty 1

## 2014-06-21 MED ORDER — ALTEPLASE 2 MG IJ SOLR
2.0000 mg | Freq: Once | INTRAMUSCULAR | Status: AC | PRN
  Filled 2014-06-21: qty 2

## 2014-06-21 MED ORDER — ASPIRIN 325 MG PO TABS
325.0000 mg | ORAL_TABLET | Freq: Every day | ORAL | Status: DC
  Administered 2014-06-22 – 2014-07-01 (×10): 325 mg via ORAL
  Filled 2014-06-21 (×10): qty 1

## 2014-06-21 MED ORDER — FENOFIBRATE 160 MG PO TABS
160.0000 mg | ORAL_TABLET | Freq: Every day | ORAL | Status: DC
  Administered 2014-06-21 – 2014-07-01 (×11): 160 mg via ORAL
  Filled 2014-06-21 (×11): qty 1

## 2014-06-21 MED ORDER — SODIUM CHLORIDE 0.9 % IV SOLN: 100.0000 mL | INTRAVENOUS | Status: DC | PRN

## 2014-06-21 MED ORDER — HEPARIN SODIUM (PORCINE) 1000 UNIT/ML DIALYSIS
1000.0000 [IU] | INTRAMUSCULAR | Status: DC | PRN
  Filled 2014-06-21: qty 1

## 2014-06-21 MED ORDER — SODIUM CHLORIDE 0.9 % IV SOLN
100.0000 mL | INTRAVENOUS | Status: DC | PRN
Start: 2014-06-21 — End: 2014-06-22

## 2014-06-21 NOTE — Progress Notes (Addendum)
STROKE TEAM PROGRESS NOTE   HISTORY [name] is a 59 y.o. female with a history of CKD and hypertension came to the [facility] ED on 3/16 complaining of shortness of breath and generalized weakness x 2 weeks. Admitted with acute hypoxemic respiratory failure. During hospital stay noted to have weakness of her left side. 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   Patient was not administered TPA secondary to late presentation.   SUBJECTIVE (INTERVAL HISTORY) Her daughter is at the bedside.  Overall she feels her condition is unchanged, still complaining of left sided weakness. Had MRI yesterday showed bilateral infarcts, embolic pattern. She denies any chest pain, palpitation, heart racing. However her telemetry monitoring showed atrial arrhythmia.   OBJECTIVE Temp:  [97.5 F (36.4 C)-98.4 F (36.9 C)] 97.5 F (36.4 C) (03/20 0802) Pulse Rate:  [91-142] 91 (03/20 1200) Cardiac Rhythm:  [-] Normal sinus rhythm (03/20 0800) Resp:  [15-24] 18 (03/20 1200) BP: (114-149)/(81-101) 125/94 mmHg (03/20 1200) SpO2:  [86 %-100 %] 90 % (03/20 1200)   Recent Labs Lab 06/18/14 1425 06/19/14 1542 06/20/14 2157 06/21/14 0808  GLUCAP 162* 132* 103* 91    Recent Labs Lab 06/17/14 1707 06/18/14 0405 06/19/14 0232 06/20/14 0850  NA 138 131* 134* 134*  K 5.5* 5.4* 4.3 3.8  CL 107 103 97 95*  CO2 18* 15* 22 25  GLUCOSE 104* 111* 218* 125*  BUN 64* 73* 60* 45*  CREATININE 5.47* 5.86* 6.12* 5.77*  CALCIUM 8.2* 7.8* 7.0* 7.4*  PHOS  --  7.8*  --  6.5*    Recent Labs Lab 06/18/14 0405 06/19/14 0232 06/20/14 0850  AST  --  246* 89*  ALT  --  209* 148*  ALKPHOS  --  156* 151*  BILITOT  --  0.7 0.8  PROT  --  6.3 6.4  ALBUMIN 2.9* 2.4* 2.5*    Recent Labs Lab 06/17/14 1707 06/18/14 0240 06/19/14 0232 06/20/14 0850  WBC 7.0 6.8 8.5 13.9*  NEUTROABS 4.2  --   --   --   HGB 12.4 12.6 11.8* 12.3  HCT 39.7  39.5 37.7 38.2  MCV 83.6 82.8 81.6 80.4  PLT 305 320 298 289    Recent Labs Lab 06/17/14 2052 06/18/14 0240 06/18/14 0844 06/20/14 2037  TROPONINI 1.13* 0.90* 0.69* 0.68*   No results for input(s): LABPROT, INR in the last 72 hours. No results for input(s): COLORURINE, LABSPEC, PHURINE, GLUCOSEU, HGBUR, BILIRUBINUR, KETONESUR, PROTEINUR, UROBILINOGEN, NITRITE, LEUKOCYTESUR in the last 72 hours.  Invalid input(s): APPERANCEUR     Component Value Date/Time   CHOL 156 06/20/2014 2037   TRIG 294* 06/20/2014 2037   HDL 20* 06/20/2014 2037   CHOLHDL 7.8 06/20/2014 2037   VLDL 59* 06/20/2014 2037   LDLCALC 77 06/20/2014 2037   No results found for: HGBA1C No results found for: LABOPIA, COCAINSCRNUR, LABBENZ, AMPHETMU, THCU, LABBARB  No results for input(s): ETH in the last 168 hours.   I have personally reviewed the radiological images below and agree with the radiology interpretations.  Ct Head Wo Contrast 06/19/2014    Acute infarct right anterior cerebral artery territory.    US Renal 06/18/2014    Indeterminate 1.9 cm hypoechoic mass off the interpolar region of the left kidney. Evaluation is markedly limited due to body habitus. This may potentially represent a cyst however solid mass is not excluded. Recommend follow-up in the outpatient setting with either  pre and post enhanced CT or MRI.  Echogenic right kidney most compatible with chronic medical renal disease.  No hydronephrosis.  Fluid within the pelvis.    2-D echo - Normal LV function; grade 1 diastolic dysfunction; systolic and diastolic septal flattening; moderate RVE with severely reduced function; severe RAE with right to left septal bowing; severe TR; findings consistent with severely elevated pulmonary pressure.  LE venous Doppler - There is no obvious evidence of DVT or SVT noted in the bilateral lower extremities. Doppler waveforms are pulsatile suggestive of fluid overload.  MRI and MRA Multifocal  areas of acute infarction throughout both hemispheres, with the largest confluent area involving the RIGHT medial parasagittal frontal and posterior frontal cortex, distal ACA territory. No proximal large vessel occlusion on MRA intracranial exam.  CUS pending   PHYSICAL EXAM  Temp:  [97.5 F (36.4 C)-98.4 F (36.9 C)] 97.5 F (36.4 C) (03/20 0802) Pulse Rate:  [91-142] 91 (03/20 1200) Resp:  [15-24] 18 (03/20 1200) BP: (114-149)/(81-101) 125/94 mmHg (03/20 1200) SpO2:  [86 %-100 %] 90 % (03/20 1200)  General - obese, well developed, in no apparent distress, lethargic.  Ophthalmologic - fundi not visualized due to incorporation.  Cardiovascular - irregularly irregular rate and rhythm.  Mental Status -  Level of arousal and orientation to months, place, and person were intact, but not to year. Language including expression, naming, repetition, comprehension was assessed and found intact. Fund of Knowledge was assessed and was impaired, not knowing previous presidents.  Cranial Nerves II - XII - II - Visual field intact OU. III, IV, VI - Extraocular movements intact. V - Facial sensation intact bilaterally. VII - left facial droop, improving. VIII - Hearing & vestibular intact bilaterally. X - Palate elevates symmetrically. XI - Chin turning & shoulder shrug intact bilaterally. XII - Tongue protrusion intact.  Motor Strength - The patient's strength was 3+/5 LUE and 0/5 without pain at LLE. RUE and RLE 5-/5. Bulk was normal and fasciculations were absent.   Motor Tone - Muscle tone was assessed at the neck and appendages and was normal.  Reflexes - The patient's reflexes were 3+ bilateral biceps and patellar reflexes, bilateral sustained ankle clonus and she had no pathological reflexes.  Sensory - Light touch, temperature/pinprick were assessed and were normal.    Coordination - The patient had normal movements in the right hand with no ataxia or dysmetria.  Tremor was  absent.  Gait and Station - not tested due to safety concerns.  ASSESSMENT/PLAN [name] is a 59 y.o. female with history of HTN, ESRD recently on HD, COPD presenting with generalized weakness and fluid overload. Started on hemodialysis. However she was found to have left-sided weakness after admission. CT head concerning for right ACA infarct. MRI showed bilateral infarcts, embolic pattern.  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  Resultant  left facial droop, left hemiparesis  MRI  embolic infarcts involving right ACA, left PCA and MCA  MRA  unremarkable  Carotid Doppler  pending  2D Echo  unremarkable  LE Venous Doppler no DVT  Due to concern of cardioembolic stroke, recommend TEE evaluation for cardiac source of emboli. If negative, recommend loop recorder to rule out atrial fibrillation.  LDL 77, not at goal  HgbA1c pending  Hypercoagulable workup ordered and pending  Heparin sq for VTE prophylaxis Diet renal W/1248mL fluid restriction  Diet NPO time specified   no antithrombotic prior  to admission, now on aspirin 81 mg orally every day. Recommend full dose aspirin (ordered).  Patient counseled to be compliant with her antithrombotic medications  Ongoing aggressive stroke risk factor management  Some hypercoagulable labs pending  Therapy recommendations:  Pending  Disposition:  Pending  ESRD recently on HD  Nephrology on board  On hemodialysis  Flu  Droplet isolation  On Tamiflu  Hypertension  Home meds:   Lasix  Stable Permissive hypertension (OK if <220/120) for 24-48 hours post stroke and then gradually normalized within 5-7 days.  Patient counseled to be compliant with her blood pressure medications  Hyperlipidemia  LDL 77, not at goal  New home meds for HLD  Recommend Lipitor 10 mg daily  Continue statin on discharge  Tobacco abuse  Current  smoker  Smoking cessation counseling provided  Pt is willing to quit  Other Stroke Risk Factors  Advanced age  Morbid obesity, Body mass index is 34.64 kg/(m^2).   COPD with CO2 retention  Other Active Problems  Leukocytosis  Other Pertinent History  Hyponatremia  Hospital day # 4   Marvel PlanJindong Utah Delauder, MD PhD Stroke Neurology 06/21/2014 12:36 PM   To contact Stroke Continuity provider, please refer to [email or web address]. After hours, contact General Neurology

## 2014-06-21 NOTE — Progress Notes (Signed)
PROGRESS NOTE  [name] ZOX:[id number] DOB: [birth year] DOA: 06/17/2014 PCP: Willey BladeEAN, [name], MD  HPI/Recap of past 24 hours: Patient is a 59 year old female with past mental history of stage V chronic kidney disease status post fistula placement although not yet on hemodialysis as well as hypertension, tobacco use and obesity who was [facility] on 3[facility]16 for shortness of breath and weakness 2 weeks. [facility] and found to have uncompensated metabolic acidosis from worsening uremia, H1N1 flu. Renal ultrasound done noted mass off of left kidney. [facility] which greatly improved her breathing. On 3/18 patient noted to be hemiparetic on the left side and a CT scan noted an infarct of the right ACA and neurology consulted. Patient since stabilized and transferred to step down unit to hospitalist service.  Patient's MRI notes focal areas of acute infarction throughout both hemispheres, consistent with an embolic phenomenon.  Patient herself tired and upset because she thinks that this may be her new state., . Still requiring significant amount of oxygen 4-6 liters, to keep oxygen saturations at 90%.   Assessment/Plan: Principal Problem:   Acute respiratory failure with hypoxia: Still requiring significant amount of oxygen support at 6 L. Chest x-ray clear. Likely this is multifactorial from influenza, chronic tobacco use an underlying COPD and volume overload.  COPD with acute exacerbation: Given echocardiogram findings, suspect that likely this is cor pulmonale. Have added IV steroids. Continue nebulizers. 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 Started on daily aspirin. We will discuss with neurology, likely patient will need TEE    Hypertension: Continued on home  medications.    ESRD needing dialysis: Seen by nephrology. They feel that long-term she may be a poor candidate for continuing dialysis. Status post 2 sessions.For fistulogram tomorrow    Obesity (BMI 30-39.9): Patient is criteria with BMI greater than 30.    Depression: Continue SSRI    Left renal mass: Once patient more stabilized, will check dedicated renal MRI    Chronic diastolic heart failure: Noted on echocardiogram. Grade 1. Managed by dialysis.    H1N1 influenza: Positive screen started on Tamiflu    Tobacco use disorder: Counseled   Code Status: Full code  Family Communication: Spoke with daughter at the bedside  Disposition Plan: continue step down unit until oxygen status improves    Consultants:  Nephrology  Critical care  Neurology  Procedures:  Echocardiogram done 3/17 noting grade 1 diastolic dysfunction  Antibiotics: Tamiflu 3/18-present  Objective: BP 142/89 mmHg  Pulse 105  Temp(Src) 97.5 F (36.4 C) (Oral)  Resp 21  Ht 5\' 6"  (1.676 m)  Wt 97.3 kg (214 lb 8.1 oz)  BMI 34.64 kg/m2  SpO2 91% No intake or output data in the 24 hours ending 06/21/14 1117 Filed Weights   06/18/14 0400 06/19/14 0311 06/20/14 0300  Weight: 106 kg (233 lb 11 oz) 105.7 kg (233 lb 0.4 oz) 97.3 kg (214 lb 8.1 oz)    Exam:   General:  Alert and oriented 3, fatiguemild distress from situation  Cardiovascular  Regular rate and rhythm, S1-S2, 2/6 systolic ejection murmur  Respiratory: Decreased breath sounds throughout, scattered wheezes   Abdomen: Soft, obese, nontender, hypoactive bowel sounds  Musculoskeletal: No clubbing or cyanosis, 1+ pitting edema bilaterally  Neurologic: Noted left side hemiparesis  Data Reviewed: Basic Metabolic Panel:  Recent Labs Lab 06/17/14 1707 06/18/14 0405 06/19/14 0232 06/20/14 0850  NA 138 131* 134* 134*  K 5.5* 5.4* 4.3 3.8  CL 107 103 97 95*  CO2 18* 15* 22 25  GLUCOSE 104* 111* 218* 125*  BUN 64* 73* 60* 45*   CREATININE 5.47* 5.86* 6.12* 5.77*  CALCIUM 8.2* 7.8* 7.0* 7.4*  PHOS  --  7.8*  --  6.5*   Liver Function Tests:  Recent Labs Lab 06/18/14 0405 06/19/14 0232 06/20/14 0850  AST  --  246* 89*  ALT  --  209* 148*  ALKPHOS  --  156* 151*  BILITOT  --  0.7 0.8  PROT  --  6.3 6.4  ALBUMIN 2.9* 2.4* 2.5*   No results for input(s): LIPASE, AMYLASE in the last 168 hours. No results for input(s): AMMONIA in the last 168 hours. CBC:  Recent Labs Lab 06/17/14 1707 06/18/14 0240 06/19/14 0232 06/20/14 0850  WBC 7.0 6.8 8.5 13.9*  NEUTROABS 4.2  --   --   --   HGB 12.4 12.6 11.8* 12.3  HCT 39.7 39.5 37.7 38.2  MCV 83.6 82.8 81.6 80.4  PLT 305 320 298 289   Cardiac Enzymes:    Recent Labs Lab 06/17/14 2052 06/18/14 0240 06/18/14 0844 06/20/14 2037  TROPONINI 1.13* 0.90* 0.69* 0.68*   BNP (last 3 results)  Recent Labs  06/17/14 2043  BNP 1378.8*    ProBNP (last 3 results) No results for input(s): PROBNP in the last 8760 hours.  CBG:  Recent Labs Lab 06/18/14 1425 06/19/14 1542 06/20/14 2157 06/21/14 0808  GLUCAP 162* 132* 103* 91    Recent Results (from the past 240 hour(s))  MRSA PCR Screening     Status: None   Collection Time: 06/17/14  9:29 PM  Result Value Ref Range Status   MRSA by PCR NEGATIVE NEGATIVE Final    Comment:        The GeneXpert MRSA Assay (FDA approved for NASAL specimens only), is one component of a comprehensive MRSA colonization surveillance program. It is not intended to diagnose MRSA infection nor to guide or monitor treatment for MRSA infections.   Clostridium Difficile by PCR     Status: None   Collection Time: 06/18/14  3:52 AM  Result Value Ref Range Status   C difficile by pcr NEGATIVE NEGATIVE Final     Studies: Mr Shirlee Latch Wo Contrast  06/20/2014   CLINICAL DATA:  Shortness of breath and generalized weakness for 2 weeks. LEFT hemiparesis and LEFT facial droop of recent onset, but unspecified duration.  History of chronic renal disease and hypertension. Acute respiratory failure. Abnormal CT head.  EXAM: MRI HEAD WITHOUT CONTRAST  MRA HEAD WITHOUT CONTRAST  TECHNIQUE: Multiplanar, multiecho pulse sequences of the brain and surrounding structures were obtained without intravenous contrast. Angiographic images of the head were obtained using MRA technique without contrast.  COMPARISON:  CT head performed 06/19/2014.  FINDINGS: MRI HEAD FINDINGS  Only a limited number of pulse sequences could be obtained on the MRI exam. Diffusion axial and T1 sagittal images are interpreted.  Multiple areas of restricted diffusion throughout both hemispheres are identified consistent with acute infarction. The largest confluent area involves the RIGHT medial parasagittal frontal and posterior frontal cortex and subcortical white matter extending as far posteriorly as the central sulcus. Other smaller areas of restricted diffusion representing acute infarction can be seen in the LEFT anterior frontal cortex, LEFT parietal parasagittal cortex,  and the LEFT parietal subcortical white matter.  No gross evidence for hemorrhage, mass lesion, hydrocephalus, or extra-axial fluid based on axial DWI. No pituitary enlargement or tonsillar herniation. Upper cervical region unremarkable. No osseous lesions.  MRA HEAD FINDINGS  Dolichoectatic but widely patent internal carotid arteries. Dolichoectatic and widely patent basilar artery. Both vertebrals contribute to basilar formation with the RIGHT dominant. There is no large vessel occlusion, proximal intracranial stenosis, or visible berry aneurysm. Moderately diseased anterior inferior cerebellar arteries bilaterally. No RIGHT PICA is visualized.  The distal RIGHT anterior cerebral artery in its pericallosal segment appears moderately diseased corresponding to the observed RIGHT ACA territory infarct.  IMPRESSION: Multifocal areas of acute infarction throughout both hemispheres, with the largest  confluent area involving the RIGHT medial parasagittal frontal and posterior frontal cortex, distal ACA territory.  A full MRI examination could not be completed, therefore comprehensive assessment of intracranial abnormalities, including characterization of the observed acute infarcts, is not possible.  No proximal large vessel occlusion on MRA intracranial exam.   Electronically Signed   By: Davonna Belling M.D.   On: 06/20/2014 17:11   Mr Brain Wo Contrast  06/20/2014   CLINICAL DATA:  Shortness of breath and generalized weakness for 2 weeks. LEFT hemiparesis and LEFT facial droop of recent onset, but unspecified duration. History of chronic renal disease and hypertension. Acute respiratory failure. Abnormal CT head.  EXAM: MRI HEAD WITHOUT CONTRAST  MRA HEAD WITHOUT CONTRAST  TECHNIQUE: Multiplanar, multiecho pulse sequences of the brain and surrounding structures were obtained without intravenous contrast. Angiographic images of the head were obtained using MRA technique without contrast.  COMPARISON:  CT head performed 06/19/2014.  FINDINGS: MRI HEAD FINDINGS  Only a limited number of pulse sequences could be obtained on the MRI exam. Diffusion axial and T1 sagittal images are interpreted.  Multiple areas of restricted diffusion throughout both hemispheres are identified consistent with acute infarction. The largest confluent area involves the RIGHT medial parasagittal frontal and posterior frontal cortex and subcortical white matter extending as far posteriorly as the central sulcus. Other smaller areas of restricted diffusion representing acute infarction can be seen in the LEFT anterior frontal cortex, LEFT parietal parasagittal cortex, and the LEFT parietal subcortical white matter.  No gross evidence for hemorrhage, mass lesion, hydrocephalus, or extra-axial fluid based on axial DWI. No pituitary enlargement or tonsillar herniation. Upper cervical region unremarkable. No osseous lesions.  MRA HEAD  FINDINGS  Dolichoectatic but widely patent internal carotid arteries. Dolichoectatic and widely patent basilar artery. Both vertebrals contribute to basilar formation with the RIGHT dominant. There is no large vessel occlusion, proximal intracranial stenosis, or visible berry aneurysm. Moderately diseased anterior inferior cerebellar arteries bilaterally. No RIGHT PICA is visualized.  The distal RIGHT anterior cerebral artery in its pericallosal segment appears moderately diseased corresponding to the observed RIGHT ACA territory infarct.  IMPRESSION: Multifocal areas of acute infarction throughout both hemispheres, with the largest confluent area involving the RIGHT medial parasagittal frontal and posterior frontal cortex, distal ACA territory.  A full MRI examination could not be completed, therefore comprehensive assessment of intracranial abnormalities, including characterization of the observed acute infarcts, is not possible.  No proximal large vessel occlusion on MRA intracranial exam.   Electronically Signed   By: Davonna Belling M.D.   On: 06/20/2014 17:11   Dg Chest Port 1 View  06/20/2014   CLINICAL DATA:  Hypoxia  EXAM: PORTABLE CHEST - 1 VIEW  COMPARISON:  06/17/2014  FINDINGS: Stable mild cardiomegaly. No mediastinal  or hilar masses or evidence of adenopathy.  Clear lungs.  No pleural effusion or pneumothorax.  IMPRESSION: No acute cardiopulmonary disease. Stable appearance from the prior exam.   Electronically Signed   By: Amie Portland M.D.   On: 06/20/2014 09:08    Scheduled Meds: . antiseptic oral rinse  7 mL Mouth Rinse BID  . aspirin  81 mg Oral Daily  . calcium acetate  667 mg Oral TID WC  . citalopram  20 mg Oral Daily  . fenofibrate  160 mg Oral Daily  . heparin subcutaneous  5,000 Units Subcutaneous 3 times per day  . ipratropium-albuterol  3 mL Nebulization TID  . methylPREDNISolone (SOLU-MEDROL) injection  40 mg Intravenous Q12H  . oseltamivir  30 mg Oral Q M,W,F-1800     Continuous Infusions:    Time spent: 35 minutes  Hollice Espy  [facility] Pager [phone]. If 7PM-7AM, please contact night-coverage at [email or web address], password [facility] 06/21/2014, 11:17 AM  LOS: 4 days

## 2014-06-21 NOTE — Progress Notes (Signed)
[facility] Progress Note    Assessment/ Plan:   1. 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 UOP very poor and bladder scan is negative for obstruction. Advanced CKD with a a lt BCF already. 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 Initiated on dialysis on 3/17. HD #2 on 3/18. --> will plan on HD again Monday after fistulogram (there are issues with cannulation on dialysis). --> requested VIR angiogram of left BCF on Monday; i called down Fri and the schedule was booked solid. Saturday would need a call in and approval but this is not an emergency. HD after dialysis. --> No further diuretics given no response. --> I am not convinced she will do well long term on dialysis; she has poor performance status.  2. Renal osteodystrophy - started on phoslo 1 tab TID with meals (1st dose on 3/20) 3. 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 4. Hypertension 5. Gout 6. Depression 7. Asthma -bronchodilators. Cardiac asthma? Feels a lot better after HD.  Subjective:   She c/o weakness and fatigue. Breathing is markedly improved. Poor appetite.   Objective:   BP 142/90 mmHg  Pulse 95  Temp(Src) 97.7 F (36.5 C) (Oral)  Resp 23  Ht  (1.676 m)  Wt 97.3 kg (214 lb 8.1 oz)  BMI 34.64 kg/m2  SpO2 90% No intake or output data in the 24 hours ending 06/21/14 0714 Weight change:   Physical Exam: HENT: Normocephalic and atraumatic.  Cardiovascular: Normal rate and regular rhythm.  No murmur heard. Pulmonary/Chest: Breath sounds normal. She has increased work of breathing.  Abdominal: Soft. Bowel sounds are normal. She exhibits no distension. There is no tenderness.  Musculoskeletal: Normal range of motion. She exhibits no edema or tenderness.  LUA BCF augments nicely only for a short segment.  Imaging: Ct Head Wo Contrast  06/19/2014   CLINICAL DATA:  Left-sided weakness.   Dialysis patient.  EXAM: CT HEAD WITHOUT CONTRAST  TECHNIQUE: Contiguous axial images were obtained from the base of the skull through the vertex without intravenous contrast.  COMPARISON:  None  FINDINGS: Ill-defined hypodensity in the right medial frontal lobe consistent with acute infarct in the right anterior cerebral artery territory. No associated hemorrhage.  Negative for chronic ischemia. Ventricle size is normal. Negative for hemorrhage or mass lesion.  Calvarium intact.  Retention cyst in the left sphenoid sinus.  IMPRESSION: Acute infarct right anterior cerebral artery territory.  Critical Value/emergent results were called by telephone at the time of interpretation on 06/19/2014 at 12:53 pm to Dr. Billy Fischer , who verbally acknowledged these results.   Electronically Signed   By: Marlan Palau M.D.   On: 06/19/2014 12:53   Mr Maxine Glenn Head Wo Contrast  06/20/2014   CLINICAL DATA:  Shortness of breath and generalized weakness for 2 weeks. LEFT hemiparesis and LEFT facial droop of recent onset, but unspecified duration. History of chronic renal disease and hypertension. Acute respiratory failure. Abnormal CT head.  EXAM: MRI HEAD WITHOUT CONTRAST  MRA HEAD WITHOUT CONTRAST  TECHNIQUE: Multiplanar, multiecho pulse sequences of the brain and surrounding structures were obtained without intravenous contrast. Angiographic images of the head were obtained using MRA technique without contrast.  COMPARISON:  CT head performed 06/19/2014.  FINDINGS: MRI HEAD FINDINGS  Only a limited number of pulse sequences could be obtained on the MRI exam. Diffusion axial and T1 sagittal images are interpreted.  Multiple areas of restricted diffusion throughout both hemispheres are identified consistent with acute infarction. The largest confluent area involves the RIGHT medial parasagittal frontal and posterior frontal cortex and subcortical white matter extending as far posteriorly as the central sulcus. Other smaller areas  of restricted diffusion representing acute infarction can be seen in the LEFT anterior frontal cortex, LEFT parietal parasagittal cortex, and the LEFT parietal subcortical white matter.  No gross evidence for hemorrhage, mass lesion, hydrocephalus, or extra-axial fluid based on axial DWI. No pituitary enlargement or tonsillar herniation. Upper cervical region unremarkable. No osseous lesions.  MRA HEAD FINDINGS  Dolichoectatic but widely patent internal carotid arteries. Dolichoectatic and widely patent basilar artery. Both vertebrals contribute to basilar formation with the RIGHT dominant. There is no large vessel occlusion, proximal intracranial stenosis, or visible berry aneurysm. Moderately diseased anterior inferior cerebellar arteries bilaterally. No RIGHT PICA is visualized.  The distal RIGHT anterior cerebral artery in its pericallosal segment appears moderately diseased corresponding to the observed RIGHT ACA territory infarct.  IMPRESSION: Multifocal areas of acute infarction throughout both hemispheres, with the largest confluent area involving the RIGHT medial parasagittal frontal and posterior frontal cortex, distal ACA territory.  A full MRI examination could not be completed, therefore comprehensive assessment of intracranial abnormalities, including characterization of the observed acute infarcts, is not possible.  No proximal large vessel occlusion on MRA intracranial exam.   Electronically Signed   By: Davonna Belling M.D.   On: 06/20/2014 17:11   Mr Brain Wo Contrast  06/20/2014   CLINICAL DATA:  Shortness of breath and generalized weakness for 2 weeks. LEFT hemiparesis and LEFT facial droop of recent onset, but unspecified duration. History of chronic renal disease and hypertension. Acute respiratory failure. Abnormal CT head.  EXAM: MRI HEAD WITHOUT CONTRAST  MRA HEAD WITHOUT CONTRAST  TECHNIQUE: Multiplanar, multiecho pulse sequences of the brain and surrounding structures were obtained without  intravenous contrast. Angiographic images of the head were obtained using MRA technique without contrast.  COMPARISON:  CT head performed 06/19/2014.  FINDINGS: MRI HEAD FINDINGS  Only a limited number of pulse sequences could be obtained on the MRI exam. Diffusion axial and T1 sagittal images are interpreted.  Multiple areas of restricted diffusion throughout both hemispheres are identified consistent with acute infarction. The largest confluent area involves the RIGHT medial parasagittal frontal and posterior frontal cortex and subcortical white matter extending as far posteriorly as the central sulcus. Other smaller areas of restricted diffusion representing acute infarction can be seen in the LEFT anterior frontal cortex, LEFT parietal parasagittal cortex, and the LEFT parietal subcortical white matter.  No gross evidence for hemorrhage, mass lesion, hydrocephalus, or extra-axial fluid based on axial DWI. No pituitary enlargement or tonsillar herniation. Upper cervical region unremarkable. No osseous lesions.  MRA HEAD FINDINGS  Dolichoectatic but widely patent internal carotid arteries. Dolichoectatic and widely patent basilar artery. Both vertebrals contribute to basilar formation with the RIGHT dominant. There is no large vessel occlusion, proximal intracranial stenosis, or visible berry aneurysm. Moderately diseased anterior inferior cerebellar arteries bilaterally. No RIGHT PICA is visualized.  The distal RIGHT anterior cerebral artery in its pericallosal segment appears moderately diseased corresponding to the observed RIGHT ACA territory infarct.  IMPRESSION: Multifocal areas of acute infarction throughout both hemispheres, with the largest confluent area involving the RIGHT medial parasagittal frontal and posterior frontal cortex, distal ACA territory.  A full MRI examination could not be completed, therefore comprehensive assessment of intracranial abnormalities, including characterization of the  observed acute  infarcts, is not possible.  No proximal large vessel occlusion on MRA intracranial exam.   Electronically Signed   By: Davonna BellingJohn  Curnes M.D.   On: 06/20/2014 17:11   Dg Chest Port 1 View  06/20/2014   CLINICAL DATA:  Hypoxia  EXAM: PORTABLE CHEST - 1 VIEW  COMPARISON:  06/17/2014  FINDINGS: Stable mild cardiomegaly. No mediastinal or hilar masses or evidence of adenopathy.  Clear lungs.  No pleural effusion or pneumothorax.  IMPRESSION: No acute cardiopulmonary disease. Stable appearance from the prior exam.   Electronically Signed   By: Amie Portlandavid  Ormond M.D.   On: 06/20/2014 09:08    Labs: BMET  Recent Labs Lab 06/17/14 1707 06/18/14 0405 06/19/14 0232 06/20/14 0850  NA 138 131* 134* 134*  K 5.5* 5.4* 4.3 3.8  CL 107 103 97 95*  CO2 18* 15* 22 25  GLUCOSE 104* 111* 218* 125*  BUN 64* 73* 60* 45*  CREATININE 5.47* 5.86* 6.12* 5.77*  CALCIUM 8.2* 7.8* 7.0* 7.4*  PHOS  --  7.8*  --  6.5*   CBC  Recent Labs Lab 06/17/14 1707 06/18/14 0240 06/19/14 0232 06/20/14 0850  WBC 7.0 6.8 8.5 13.9*  NEUTROABS 4.2  --   --   --   HGB 12.4 12.6 11.8* 12.3  HCT 39.7 39.5 37.7 38.2  MCV 83.6 82.8 81.6 80.4  PLT 305 320 298 289    Medications:    . antiseptic oral rinse  7 mL Mouth Rinse BID  . aspirin  81 mg Oral Daily  . calcium acetate  667 mg Oral TID WC  . citalopram  20 mg Oral Daily  . heparin subcutaneous  5,000 Units Subcutaneous 3 times per day  . ipratropium-albuterol  3 mL Nebulization TID  . oseltamivir  30 mg Oral Q M,W,F-1800      Paulene FloorJames Erhardt Dada, MD 06/21/2014, 7:14 AM

## 2014-06-22 ENCOUNTER — Inpatient Hospital Stay (HOSPITAL_COMMUNITY): Payer: Medicaid Other

## 2014-06-22 LAB — CBC
HCT: 39 % (ref 36.0–46.0)
HEMOGLOBIN: 12.5 g/dL (ref 12.0–15.0)
MCH: 25.6 pg — ABNORMAL LOW (ref 26.0–34.0)
MCHC: 32.1 g/dL (ref 30.0–36.0)
MCV: 79.8 fL (ref 78.0–100.0)
PLATELETS: 319 10*3/uL (ref 150–400)
RBC: 4.89 MIL/uL (ref 3.87–5.11)
RDW: 18.8 % — ABNORMAL HIGH (ref 11.5–15.5)
WBC: 9.7 10*3/uL (ref 4.0–10.5)

## 2014-06-22 LAB — BASIC METABOLIC PANEL
Anion gap: 14 (ref 5–15)
BUN: 64 mg/dL — ABNORMAL HIGH (ref 6–23)
CHLORIDE: 98 mmol/L (ref 96–112)
CO2: 26 mmol/L (ref 19–32)
Calcium: 8.8 mg/dL (ref 8.4–10.5)
Creatinine, Ser: 5.63 mg/dL — ABNORMAL HIGH (ref 0.50–1.10)
GFR calc Af Amer: 9 mL/min — ABNORMAL LOW (ref >90)
GFR, EST NON AFRICAN AMERICAN: 8 mL/min — AB (ref >90)
GLUCOSE: 135 mg/dL — AB (ref 70–99)
POTASSIUM: 3.7 mmol/L (ref 3.5–5.1)
Sodium: 138 mmol/L (ref 135–145)

## 2014-06-22 LAB — HEMOGLOBIN A1C
HEMOGLOBIN A1C: 6.5 % — AB (ref 4.8–5.6)
MEAN PLASMA GLUCOSE: 140 mg/dL

## 2014-06-22 LAB — GLUCOSE, CAPILLARY
GLUCOSE-CAPILLARY: 143 mg/dL — AB (ref 70–99)
Glucose-Capillary: 136 mg/dL — ABNORMAL HIGH (ref 70–99)
Glucose-Capillary: 143 mg/dL — ABNORMAL HIGH (ref 70–99)

## 2014-06-22 MED ORDER — LORAZEPAM 0.5 MG PO TABS
ORAL_TABLET | ORAL | Status: AC
  Administered 2014-06-22: 0.5 mg via ORAL
  Filled 2014-06-22: qty 1

## 2014-06-22 MED ORDER — METOPROLOL TARTRATE 1 MG/ML IV SOLN
INTRAVENOUS | Status: AC
  Administered 2014-06-22: 5 mg via INTRAVENOUS
  Filled 2014-06-22: qty 5

## 2014-06-22 MED ORDER — RENA-VITE PO TABS
1.0000 | ORAL_TABLET | Freq: Every day | ORAL | Status: DC
  Administered 2014-06-22 – 2014-06-30 (×9): 1 via ORAL
  Filled 2014-06-22 (×10): qty 1

## 2014-06-22 MED ORDER — IOHEXOL 300 MG/ML  SOLN
100.0000 mL | Freq: Once | INTRAMUSCULAR | Status: AC | PRN
  Administered 2014-06-22: 50 mL via INTRAVENOUS

## 2014-06-22 MED ORDER — LORAZEPAM 0.5 MG PO TABS
0.5000 mg | ORAL_TABLET | Freq: Once | ORAL | Status: AC
  Administered 2014-06-22: 0.5 mg via ORAL

## 2014-06-22 MED ORDER — METOPROLOL TARTRATE 1 MG/ML IV SOLN
5.0000 mg | Freq: Once | INTRAVENOUS | Status: AC
  Administered 2014-06-22: 5 mg via INTRAVENOUS

## 2014-06-22 NOTE — Progress Notes (Signed)
 PROGRESS NOTE  [name] MRN:[id number] DOB: 02/27/1956 DOA: 06/17/2014 PCP: DEAN, [name], MD  HPI/Recap of past 24 hours: Patient is a 58-year-old female with past mental history of stage V chronic kidney disease status post fistula placement although not yet on hemodialysis as well as hypertension, tobacco use and obesity who was admitted at [facility] on 3/16 for shortness of breath and weakness 2 weeks. Patient admitted to [facility] service and found to have uncompensated metabolic acidosis from worsening uremia, H1N1 flu. Renal ultrasound done noted mass off of left kidney. Patient transferred to [facility] for dialysis which greatly improved her breathing. On 3/18 patient noted to be hemiparetic on the left side and a CT scan noted an infarct of the right ACA and neurology consulted. Patient since stabilized and transferred to step down unit to hospitalist service.  Patient's MRI notes focal areas of acute infarction throughout both hemispheres, consistent with an embolic phenomenon.  . Still requiring some oxygen, but has been staying around 4.5 L, improved from yesterday. Unable to tolerate dialysis today because of significantly elevated blood pressures in part from volume overload as well as recent stroke  Assessment/Plan: Principal Problem:   Acute respiratory failure with hypoxia: Oxygen requirements slowly decreasing, Chest x-ray clear. Likely this is multifactorial from influenza, chronic tobacco use an underlying COPD and volume overload.  COPD with acute exacerbation: Given echocardiogram findings, suspect that likely this is cor pulmonale. Have added IV steroids. Continue nebulizers. 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 Started on daily aspirin. Cardiology consulted for TEE. Dopplers unrevealing    Hypertension:  Continued on home medications.    ESRD needing dialysis: Seen by nephrology. They feel that long-term she may be a poor candidate for continuing dialysis. Status post 2 sessions.fistulogram noted dilatation of cephalic vein beyond arterial anastomoses. Unable to get dialysis today because of elevated blood pressure   Obesity (BMI 30-39.9): Patient is criteria with BMI greater than 30.    Depression: Continue SSRI    Left renal mass: Once patient more stabilized, will check dedicated renal MRI    Chronic diastolic heart failure: Noted on echocardiogram. Grade 1. Managed by dialysis.    H1N1 influenza: Positive screen started on Tamiflu    Tobacco use disorder: Counseled   Code Status: Full code  Family Communication: Left message for daughter  Disposition Plan: continue step down unit until oxygen status improves    Consultants:  Nephrology  Critical care  Neurology  Procedures:  Echocardiogram done 3/17 noting grade 1 diastolic dysfunction  Antibiotics: Tamiflu 3/18-present  Objective: BP 147/116 mmHg  Pulse 98  Temp(Src) 97.3 F (36.3 C) (Oral)  Resp 20  Ht 5' 6" (1.676 m)  Wt 100.6 kg (221 lb 12.5 oz)  BMI 35.81 kg/m2  SpO2 96%  Intake/Output Summary (Last 24 hours) at 06/22/14 1843 Last data filed at 06/22/14 1743  Gross per 24 hour  Intake     60 ml  Output    900 ml  Net   -840 ml   Filed Weights   06/20/14 0300 06/22/14 1356 06/22/14 1743  Weight: 97.3 kg (214 lb 8.1 oz) 100 kg (220 lb 7.4 oz) 100.6 kg (221 lb 12.5 oz)    Exam:   General:  Alert and oriented 3, fatigue  Cardiovascular  Regular rate and rhythm, S1-S2, 2/6   systolic ejection murmur, borderline tachycardia  Respiratory: Decreased breath sounds throughout, scattered wheezes   Abdomen: Soft, obese, nontender, hypoactive bowel sounds  Musculoskeletal: No clubbing or cyanosis, 1+ pitting edema bilaterally  Neurologic: Noted left side hemiparesis   Data Reviewed: Basic  Metabolic Panel:  Recent Labs Lab 06/17/14 1707 06/18/14 0405 06/19/14 0232 06/20/14 0850 06/22/14 0405  NA 138 131* 134* 134* 138  K 5.5* 5.4* 4.3 3.8 3.7  CL 107 103 97 95* 98  CO2 18* 15* 22 25 26  GLUCOSE 104* 111* 218* 125* 135*  BUN 64* 73* 60* 45* 64*  CREATININE 5.47* 5.86* 6.12* 5.77* 5.63*  CALCIUM 8.2* 7.8* 7.0* 7.4* 8.8  PHOS  --  7.8*  --  6.5*  --    Liver Function Tests:  Recent Labs Lab 06/18/14 0405 06/19/14 0232 06/20/14 0850  AST  --  246* 89*  ALT  --  209* 148*  ALKPHOS  --  156* 151*  BILITOT  --  0.7 0.8  PROT  --  6.3 6.4  ALBUMIN 2.9* 2.4* 2.5*   No results for input(s): LIPASE, AMYLASE in the last 168 hours. No results for input(s): AMMONIA in the last 168 hours. CBC:  Recent Labs Lab 06/17/14 1707 06/18/14 0240 06/19/14 0232 06/20/14 0850 06/22/14 0405  WBC 7.0 6.8 8.5 13.9* 9.7  NEUTROABS 4.2  --   --   --   --   HGB 12.4 12.6 11.8* 12.3 12.5  HCT 39.7 39.5 37.7 38.2 39.0  MCV 83.6 82.8 81.6 80.4 79.8  PLT 305 320 298 289 319   Cardiac Enzymes:    Recent Labs Lab 06/17/14 2052 06/18/14 0240 06/18/14 0844 06/20/14 2037  TROPONINI 1.13* 0.90* 0.69* 0.68*   BNP (last 3 results)  Recent Labs  06/17/14 2043  BNP 1378.8*    ProBNP (last 3 results) No results for input(s): PROBNP in the last 8760 hours.  CBG:  Recent Labs Lab 06/21/14 1307 06/21/14 1708 06/21/14 2133 06/22/14 0833 06/22/14 1224  GLUCAP 78 107* 123* 136* 143*    Recent Results (from the past 240 hour(s))  MRSA PCR Screening     Status: None   Collection Time: 06/17/14  9:29 PM  Result Value Ref Range Status   MRSA by PCR NEGATIVE NEGATIVE Final    Comment:        The GeneXpert MRSA Assay (FDA approved for NASAL specimens only), is one component of a comprehensive MRSA colonization surveillance program. It is not intended to diagnose MRSA infection nor to guide or monitor treatment for MRSA infections.   Clostridium Difficile by PCR      Status: None   Collection Time: 06/18/14  3:52 AM  Result Value Ref Range Status   C difficile by pcr NEGATIVE NEGATIVE Final     Studies: No results found.  Scheduled Meds: . antiseptic oral rinse  7 mL Mouth Rinse BID  . aspirin  325 mg Oral Daily  . atorvastatin  10 mg Oral q1800  . calcium acetate  667 mg Oral TID WC  . citalopram  20 mg Oral Daily  . fenofibrate  160 mg Oral Daily  . heparin subcutaneous  5,000 Units Subcutaneous 3 times per day  . ipratropium-albuterol  3 mL Nebulization TID  . methylPREDNISolone (SOLU-MEDROL) injection  40 mg Intravenous Q12H  . multivitamin  1 tablet Oral QHS    Continuous Infusions:    Time spent: 25 minutes  Morganne Haile K  [facility] Pager [phone]. If 7PM-7AM,   please contact night-coverage at [email or web address], password [email or web address] 06/22/2014, 6:43 PM  LOS: 5 days              

## 2014-06-22 NOTE — Progress Notes (Signed)
Subjective:   L arm sore, no complaints  Objective Filed Vitals:   06/22/14 0834 06/22/14 0902 06/22/14 1226 06/22/14 1356  BP: 159/96  147/110 136/96  Pulse:    76  Temp: 97.5 F (36.4 C)  97.5 F (36.4 C)   TempSrc: Oral  Axillary   Resp:    21  Height:      Weight:      SpO2:  95%  92%   Physical Exam General: alert and oriented, no acute distress. On HD Heart: RRR, tachy Lungs: CTA unlabored Abdomen: soft, nontender +BS Extremities: no edema Dialysis Access: L AVF patent on HD  Assessment/Plan: 1. ESRD -Initiated on dialysis on 3/17. HD #2 on 3/18.- #3HD today. uf goal 2500. fistulogram today- - focal aneurysmal dilatation of the cephalic vein just beyond the arterial anastomosis. There is competing venous outflow in the deep venous system of the upper arm.- no intervention done 2. CVA- acute infarct in right anterior cerebral artery territory. Neurology following. Carotid doppler 1-39% stenosis in R and L carotid artery. ASA  LLE > LUE weakness 2. Anemia - hgb 12.5, no ESA, watch CBC 3. Secondary hyperparathyroidism -ca+ 8.8  phos 6.5 phoslo started 4 HTN/volume - wt down about 9 kgs since admission.  5. Nutrition - alb 2.5 renal diet. Vitamin. nepro 6. Asthma- per primary 7. Flu- Influenza A / H1N1- per primary  Jetty Duhamel, NP [facility] Beeper [phone] 06/22/2014,2:27 PM  LOS: 5 days   Pt seen, examined and agree w A/P as above.  [name] MD pager [phone]    cell [phone] 06/22/2014, 3:14 PM    Additional Objective Labs: Basic Metabolic Panel:  Recent Labs Lab 06/18/14 0405 06/19/14 0232 06/20/14 0850 06/22/14 0405  NA 131* 134* 134* 138  K 5.4* 4.3 3.8 3.7  CL 103 97 95* 98  CO2 15* GLUCOSE 111* 218* 125* 135*  BUN 73* 60* 45* 64*  CREATININE 5.86* 6.12* 5.77* 5.63*  CALCIUM 7.8* 7.0* 7.4* 8.8  PHOS 7.8*  --  6.5*  --    Liver Function Tests:  Recent Labs Lab 06/18/14 0405 06/19/14 0232  06/20/14 0850  AST  --  246* 89*  ALT  --  209* 148*  ALKPHOS  --  156* 151*  BILITOT  --  0.7 0.8  PROT  --  6.3 6.4  ALBUMIN 2.9* 2.4* 2.5*   No results for input(s): LIPASE, AMYLASE in the last 168 hours. CBC:  Recent Labs Lab 06/17/14 1707 06/18/14 0240 06/19/14 0232 06/20/14 0850 06/22/14 0405  WBC 7.0 6.8 8.5 13.9* 9.7  NEUTROABS 4.2  --   --   --   --   HGB 12.4 12.6 11.8* 12.3 12.5  HCT 39.7 39.5 37.7 38.2 39.0  MCV 83.6 82.8 81.6 80.4 79.8  PLT 305 320 298 289 319   Blood Culture No results found for: SDES, SPECREQUEST, CULT, REPTSTATUS  Cardiac Enzymes:  Recent Labs Lab 06/17/14 2052 06/18/14 0240 06/18/14 0844 06/20/14 2037  TROPONINI 1.13* 0.90* 0.69* 0.68*   CBG:  Recent Labs Lab 06/21/14 1307 06/21/14 1708 06/21/14 2133 06/22/14 0833 06/22/14 1224  GLUCAP 78 107* 123* 136* 143*   Iron Studies: No results for input(s): IRON, TIBC, TRANSFERRIN, FERRITIN in the last 72 hours. @ Studies/Results: Mr Shirlee Latch Wo Contrast  06/20/2014   CLINICAL DATA:  Shortness of breath and generalized weakness for 2 weeks. LEFT hemiparesis and LEFT facial droop of recent onset, but unspecified duration.  History of chronic renal disease and hypertension. Acute respiratory failure. Abnormal CT head.  EXAM: MRI HEAD WITHOUT CONTRAST  MRA HEAD WITHOUT CONTRAST  TECHNIQUE: Multiplanar, multiecho pulse sequences of the brain and surrounding structures were obtained without intravenous contrast. Angiographic images of the head were obtained using MRA technique without contrast.  COMPARISON:  CT head performed 06/19/2014.  FINDINGS: MRI HEAD FINDINGS  Only a limited number of pulse sequences could be obtained on the MRI exam. Diffusion axial and T1 sagittal images are interpreted.  Multiple areas of restricted diffusion throughout both hemispheres are identified consistent with acute infarction. The largest confluent area involves the RIGHT medial parasagittal  frontal and posterior frontal cortex and subcortical white matter extending as far posteriorly as the central sulcus. Other smaller areas of restricted diffusion representing acute infarction can be seen in the LEFT anterior frontal cortex, LEFT parietal parasagittal cortex, and the LEFT parietal subcortical white matter.  No gross evidence for hemorrhage, mass lesion, hydrocephalus, or extra-axial fluid based on axial DWI. No pituitary enlargement or tonsillar herniation. Upper cervical region unremarkable. No osseous lesions.  MRA HEAD FINDINGS  Dolichoectatic but widely patent internal carotid arteries. Dolichoectatic and widely patent basilar artery. Both vertebrals contribute to basilar formation with the RIGHT dominant. There is no large vessel occlusion, proximal intracranial stenosis, or visible berry aneurysm. Moderately diseased anterior inferior cerebellar arteries bilaterally. No RIGHT PICA is visualized.  The distal RIGHT anterior cerebral artery in its pericallosal segment appears moderately diseased corresponding to the observed RIGHT ACA territory infarct.  IMPRESSION: Multifocal areas of acute infarction throughout both hemispheres, with the largest confluent area involving the RIGHT medial parasagittal frontal and posterior frontal cortex, distal ACA territory.  A full MRI examination could not be completed, therefore comprehensive assessment of intracranial abnormalities, including characterization of the observed acute infarcts, is not possible.  No proximal large vessel occlusion on MRA intracranial exam.   Electronically Signed   By: Davonna Belling M.D.   On: 06/20/2014 17:11   Mr Brain Wo Contrast  06/20/2014   CLINICAL DATA:  Shortness of breath and generalized weakness for 2 weeks. LEFT hemiparesis and LEFT facial droop of recent onset, but unspecified duration. History of chronic renal disease and hypertension. Acute respiratory failure. Abnormal CT head.  EXAM: MRI HEAD WITHOUT CONTRAST   MRA HEAD WITHOUT CONTRAST  TECHNIQUE: Multiplanar, multiecho pulse sequences of the brain and surrounding structures were obtained without intravenous contrast. Angiographic images of the head were obtained using MRA technique without contrast.  COMPARISON:  CT head performed 06/19/2014.  FINDINGS: MRI HEAD FINDINGS  Only a limited number of pulse sequences could be obtained on the MRI exam. Diffusion axial and T1 sagittal images are interpreted.  Multiple areas of restricted diffusion throughout both hemispheres are identified consistent with acute infarction. The largest confluent area involves the RIGHT medial parasagittal frontal and posterior frontal cortex and subcortical white matter extending as far posteriorly as the central sulcus. Other smaller areas of restricted diffusion representing acute infarction can be seen in the LEFT anterior frontal cortex, LEFT parietal parasagittal cortex, and the LEFT parietal subcortical white matter.  No gross evidence for hemorrhage, mass lesion, hydrocephalus, or extra-axial fluid based on axial DWI. No pituitary enlargement or tonsillar herniation. Upper cervical region unremarkable. No osseous lesions.  MRA HEAD FINDINGS  Dolichoectatic but widely patent internal carotid arteries. Dolichoectatic and widely patent basilar artery. Both vertebrals contribute to basilar formation with the RIGHT dominant. There is no large vessel occlusion, proximal intracranial  stenosis, or visible berry aneurysm. Moderately diseased anterior inferior cerebellar arteries bilaterally. No RIGHT PICA is visualized.  The distal RIGHT anterior cerebral artery in its pericallosal segment appears moderately diseased corresponding to the observed RIGHT ACA territory infarct.  IMPRESSION: Multifocal areas of acute infarction throughout both hemispheres, with the largest confluent area involving the RIGHT medial parasagittal frontal and posterior frontal cortex, distal ACA territory.  A full MRI  examination could not be completed, therefore comprehensive assessment of intracranial abnormalities, including characterization of the observed acute infarcts, is not possible.  No proximal large vessel occlusion on MRA intracranial exam.   Electronically Signed   By: Davonna BellingJohn  Curnes M.D.   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  EXAM: DIALYSIS AV FISTULOGRAM  COMPARISON:  None.  CONTRAST:  50mL OMNIPAQUE IOHEXOL 300 MG/ML  SOLN  FLUOROSCOPY TIME:  24 seconds.  PROCEDURE: The procedure, risks, benefits, and alternatives were explained to the patient. Questions regarding the procedure were encouraged and answered. The patient understands and consents to the procedure.  The left arm native fistula was prepped with Betadine in a sterile fashion, and a sterile drape was applied covering the operative field. A diagnostic fistulogram was performed via an 18 gauge angiocatheter introduced into venous outflow. Venous drainage was assessed to the level of the central veins in the chest. Proximal fistula was studied by reflux maneuver with temporary compression of venous outflow. After the procedure, the angiocatheter was removed and hemostasis obtained with manual compression.  COMPLICATIONS: None  FINDINGS: Antecubital anastomosis between the brachial artery and cephalic vein is normally patent. After a short segment, there is focal aneurysmal dilatation of the vein in the antecubital fossa. There is competing venous outflow noted in the cephalic vein as well as the deep venous system. The cephalic vein is tortuous but shows no significant focal stenoses. Central veins are normally patent, including the SVC.  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 There is competing venous outflow in the  deep venous system of the upper arm.  ACCESS: No percutaneous intervention was indicated today.   Electronically Signed   By: Irish LackGlenn  Yamagata M.D.   On: 06/22/2014 13:20   Medications:   . antiseptic oral rinse  7 mL Mouth Rinse BID  . aspirin  325 mg Oral Daily  . atorvastatin  10 mg Oral q1800  . calcium acetate  667 mg Oral TID WC  . citalopram  20 mg Oral Daily  . fenofibrate  160 mg Oral Daily  . heparin subcutaneous  5,000 Units Subcutaneous 3 times per day  . ipratropium-albuterol  3 mL Nebulization TID  . methylPREDNISolone (SOLU-MEDROL) injection  40 mg Intravenous Q12H  . oseltamivir  30 mg Oral Q M,W,F-1800

## 2014-06-22 NOTE — Evaluation (Signed)
Clinical/Bedside Swallow Evaluation Patient Details  Name: [name] MRN: [id number] Date of Birth: 1955-04-10  Today's Date: 06/22/2014 Time: SLP Start Time (ACUTE ONLY): 1032 SLP Stop Time (ACUTE ONLY): 1052 SLP Time Calculation (min) (ACUTE ONLY): 20 min  Past Medical History:  Past Medical History  Diagnosis Date  . Asthma   . Hypertension   . Gout   . Arthritis   . Insomnia   . Chronic kidney disease   . Depression    Past Surgical History:  Past Surgical History  Procedure Laterality Date  . Multiple tooth extractions    . Av fistula placement Left 01/28/2014    Procedure: ARTERIOVENOUS (AV) FISTULA CREATION;  Surgeon: Sherren Kerns, MD;  Location: [facility] OR;  Service: Vascular;  Laterality: Left;   HPI:  Pt is a 59 y/o female with hx of asthma, CKD and hypertension came to the [facility] ED on 3/16 complaining of shortness of breath and generalized weakness x 2 weeks. Worse with exertion and chest tightness during these times.She noted not taking her diuretic medications for majority of that time period.RLE edema was "bad" but has now resolved. She denied fever but noted some mild chills. In the ED she was noted to be hypoxemic and in what is believed to be acute on chronic renal failure. MRi revealed bilateral infarcts involving both hemispheres with the largest congluent area involving the RIGHT medial parasagittal frontal and posterior frontal cortex, distal ACA territoty. CXR revealed no pleural effusion or pneumothorax; clear lungs.    Assessment / Plan / Recommendation Clinical Impression   Clinical swallow evaluation completed. Pt demonstrated delayed cough x2 when given large sips of thin liquids via straw. No s/s of aspiration observed with small cups sips of thin liquids, puree and solid consistencies. Pt still at risk for aspiration given hx of asthma, possibly resulting in decreased coordination of respiration and swallow. Given good RR and clear vocal quality,  recommend pt initiate reg/ thin liquid diet; no straws; slow rate. Speech will follow-up for diet tolerance.      Aspiration Risk  Mild    Diet Recommendation Regular;Thin liquid   Liquid Administration via: Cup;No straw Medication Administration: Whole meds with puree Compensations: Slow rate;Check for pocketing Postural Changes and/or Swallow Maneuvers: Seated upright 90 degrees    Other  Recommendations Oral Care Recommendations: Oral care BID   Follow Up Recommendations  None    Frequency and Duration min 2x/week  2 weeks   Pertinent Vitals/Pain     SLP Swallow Goals     Swallow Study Prior Functional Status       General HPI: Pt is a 59 y/o female with hx of asthma, CKD and hypertension came to the [facility] ED on 3/16 complaining of shortness of breath and generalized weakness x 2 weeks. Worse with exhertion and chest tightness during these times.She noted not taking her diuretic medications for majority of that time period.RLE edema was "bad" but has now resolved. She denied fever but noted some mild chills. In the ED she was noted to be hypoxemic and in what is believed to be acute on chronic renal failure. MRi revealed bilateral infarcts involving both hemispheres with the largest congluent area involving the RIGHT medial parasagittal frontal and posterior frontal cortex, distal ACA territoty. CXR revealed no pleural effusion or pneumothorax; clear lungs.  Type of Study: Bedside swallow evaluation Diet Prior to this Study: Regular;Thin liquids Temperature Spikes Noted: No Respiratory Status: Nasal cannula Behavior/Cognition: Alert;Cooperative Oral Cavity - Dentition:  Adequate natural dentition Self-Feeding Abilities: Needs set up;Needs assist Patient Positioning: Upright in bed Baseline Vocal Quality: Clear Volitional Cough: Weak Volitional Swallow: Able to elicit    Oral/Motor/Sensory Function Overall Oral Motor/Sensory Function: Appears within functional limits for  tasks assessed Labial ROM: Within Functional Limits Labial Symmetry: Within Functional Limits Lingual ROM: Within Functional Limits Lingual Symmetry: Within Functional Limits Facial ROM: Within Functional Limits   Ice Chips     Thin Liquid Thin Liquid: Within functional limits Presentation: Cup;Straw    Nectar Thick Nectar Thick Liquid: Not tested   Honey Thick Honey Thick Liquid: Not tested   Puree Puree: Within functional limits   Solid   GO    Solid: Within functional limits       [name], Stephanie Brawn 06/22/2014,10:59 AM

## 2014-06-22 NOTE — Progress Notes (Signed)
PT Cancellation Note  Patient Details Name: [name] MRN: [id number] DOB: 06/30/1955   Cancelled Treatment:    Reason Eval/Treat Not Completed: Patient at procedure or test/unavailable   [name], Korben Carcione J 06/22/2014, 1:57 PM Charlotte Crumbevon Carey Lafon, PT DPT  [phone]

## 2014-06-22 NOTE — Progress Notes (Signed)
STROKE TEAM PROGRESS NOTE   HISTORY [name] is a 59 y.o. female with a history of CKD and hypertension [facility] complaining of shortness of breath and generalized weakness x 2 weeks. Admitted with acute hypoxemic respiratory failure. During hospital stay noted to have weakness of her left side. 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   Patient was not administered TPA secondary to late presentation.   SUBJECTIVE (INTERVAL HISTORY) Her family is not at the bedside.  Overall she feels her condition is unchanged, still complaining of left sided weakness. Had carotid doppplers this am which were negative for significant stenosis..   OBJECTIVE Temp:  [97.4 F (36.3 C)-98.1 F (36.7 C)] 97.4 F (36.3 C) (03/21 1356) Pulse Rate:  [40-107] 66 (03/21 1430) Cardiac Rhythm:  [-] Sinus tachycardia (03/21 0815) Resp:  [14-29] 21 (03/21 1356) BP: (131-170)/(88-117) 142/117 mmHg (03/21 1430) SpO2:  [90 %-97 %] 92 % (03/21 1356) Weight:  [220 lb 7.4 oz (100 kg)] 220 lb 7.4 oz (100 kg) (03/21 1356)   Recent Labs Lab 06/21/14 1307 06/21/14 1708 06/21/14 2133 06/22/14 0833 06/22/14 1224  GLUCAP 78 107* 123* 136* 143*    Recent Labs Lab 06/17/14 1707 06/18/14 0405 06/19/14 0232 06/20/14 0850 06/22/14 0405  NA 138 131* 134* 134* 138  K 5.5* 5.4* 4.3 3.8 3.7  CL 107 103 97 95* 98  CO2 18* 15* 22 25 26   GLUCOSE 104* 111* 218* 125* 135*  BUN 64* 73* 60* 45* 64*  CREATININE 5.47* 5.86* 6.12* 5.77* 5.63*  CALCIUM 8.2* 7.8* 7.0* 7.4* 8.8  PHOS  --  7.8*  --  6.5*  --     Recent Labs Lab 06/18/14 0405 06/19/14 0232 06/20/14 0850  AST  --  246* 89*  ALT  --  209* 148*  ALKPHOS  --  156* 151*  BILITOT  --  0.7 0.8  PROT  --  6.3 6.4  ALBUMIN 2.9* 2.4* 2.5*    Recent Labs Lab 06/17/14 1707 06/18/14 0240 06/19/14 0232 06/20/14 0850 06/22/14 0405  WBC 7.0 6.8 8.5 13.9* 9.7   NEUTROABS 4.2  --   --   --   --   HGB 12.4 12.6 11.8* 12.3 12.5  HCT 39.7 39.5 37.7 38.2 39.0  MCV 83.6 82.8 81.6 80.4 79.8  PLT 305 320 298 289 319    Recent Labs Lab 06/17/14 2052 06/18/14 0240 06/18/14 0844 06/20/14 2037  TROPONINI 1.13* 0.90* 0.69* 0.68*   No results for input(s): LABPROT, INR in the last 72 hours. No results for input(s): COLORURINE, LABSPEC, PHURINE, GLUCOSEU, HGBUR, BILIRUBINUR, KETONESUR, PROTEINUR, UROBILINOGEN, NITRITE, LEUKOCYTESUR in the last 72 hours.  Invalid input(s): APPERANCEUR     Component Value Date/Time   CHOL 156 06/20/2014 2037   TRIG 294* 06/20/2014 2037   HDL 20* 06/20/2014 2037   CHOLHDL 7.8 06/20/2014 2037   VLDL 59* 06/20/2014 2037   LDLCALC 77 06/20/2014 2037   No results found for: HGBA1C No results found for: LABOPIA, COCAINSCRNUR, LABBENZ, AMPHETMU, THCU, LABBARB  No results for input(s): ETH in the last 168 hours.   I have personally reviewed the radiological images below and agree with the radiology interpretations.  Ct Head Wo Contrast 06/19/2014    Acute infarct right anterior cerebral artery territory.    [place] Renal 06/18/2014    Indeterminate 1.9 cm hypoechoic mass off the interpolar region of the  left kidney. Evaluation is markedly limited due to body habitus. This may potentially represent a cyst however solid mass is not excluded. Recommend follow-up in the outpatient setting with either pre and post enhanced CT or MRI.  Echogenic right kidney most compatible with chronic medical renal disease.  No hydronephrosis.  Fluid within the pelvis.    2-D echo - Normal LV function; grade 1 diastolic dysfunction; systolic and diastolic septal flattening; moderate RVE with severely reduced function; severe RAE with right to left septal bowing; severe TR; findings consistent with severely elevated pulmonary pressure.  LE venous Doppler - There is no obvious evidence of DVT or SVT noted in the bilateral lower  extremities. Doppler waveforms are pulsatile suggestive of fluid overload.  MRI and MRA Multifocal areas of acute infarction throughout both hemispheres, with the largest confluent area involving the RIGHT medial parasagittal frontal and posterior frontal cortex, distal ACA territory. No proximal large vessel occlusion on MRA intracranial exam.  CUS   : Bilateral: 1-39% ICA stenosis. Vertebral artery flow is antegrade.     PHYSICAL EXAM  Temp:  [97.4 F (36.3 C)-98.1 F (36.7 C)] 97.4 F (36.3 C) (03/21 1356) Pulse Rate:  [40-107] 66 (03/21 1430) Resp:  [14-29] 21 (03/21 1356) BP: (131-170)/(88-117) 142/117 mmHg (03/21 1430) SpO2:  [90 %-97 %] 92 % (03/21 1356) Weight:  [220 lb 7.4 oz (100 kg)] 220 lb 7.4 oz (100 kg) (03/21 1356)  General - obese middle aged lady, well developed, in no apparent distress, lethargic.  Ophthalmologic - fundi not visualized due to incorporation.  Cardiovascular - irregularly irregular rate and rhythm.  Mental Status -  Level of arousal and orientation to months, place, and person were intact, but not to year. Language including expression, naming, repetition, comprehension was assessed and found intact. Fund of Knowledge was assessed and was impaired,    Cranial Nerves II - XII - II - Visual field intact OU. III, IV, VI - Extraocular movements intact. V - Facial sensation intact bilaterally. VII - Mild left facial droop,   VIII - Hearing & vestibular intact bilaterally. X - Palate elevates symmetrically. XI - Chin turning & shoulder shrug intact bilaterally. XII - Tongue protrusion intact.  Motor Strength - The patient's strength was 3+/5 LUE and 0/5 without pain at LLE. RUE and RLE 5-/5. Bulk was normal and fasciculations were absent.   Motor Tone - Muscle tone was assessed at the neck and appendages and was normal.  Reflexes - The patient's reflexes were 3+ bilateral biceps and patellar reflexes,   and she had no pathological  reflexes.  Sensory - Light touch, temperature/pinprick were assessed and were normal.    Coordination - The patient had normal movements in the right hand with no ataxia or dysmetria.  Tremor was absent.  Gait and Station - not tested due to safety concerns.  ASSESSMENT/PLAN [name] is a 59 y.o. female with history of HTN, ESRD recently on HD, COPD presenting with generalized weakness and fluid overload. Started on hemodialysis. However she was found to have left-sided weakness after admission. CT head concerning for right ACA infarct. MRI showed bilateral infarcts, embolic pattern.  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  Resultant  left facial droop, left hemiparesis  MRI  embolic infarcts involving right ACA, left PCA and MCA  MRA  unremarkable  Carotid Doppler  pending  2D Echo  unremarkable  LE Venous Doppler no DVT  Due to concern of cardioembolic stroke, recommend TEE evaluation for cardiac source of emboli. If negative, recommend loop recorder to rule out atrial fibrillation.  LDL 77, not at goal  HgbA1c pending  Hypercoagulable workup ordered and pending  Heparin sq for VTE prophylaxis  Diet renal W/1242mL fluid restriction   no antithrombotic prior to admission, now on aspirin 81 mg orally every day. Recommend full dose aspirin (ordered).  Patient counseled to be compliant with her antithrombotic medications  Ongoing aggressive stroke risk factor management  Some hypercoagulable labs pending  Therapy recommendations:  Pending  Disposition:  Pending  ESRD recently on HD  Nephrology on board  On hemodialysis  Flu  Droplet isolation  On Tamiflu  Hypertension  Home meds:   Lasix  Stable Permissive hypertension (OK if <220/120) for 24-48 hours post stroke and then gradually normalized within 5-7 days.  Patient counseled to be compliant with her blood pressure  medications  Hyperlipidemia  LDL 77, not at goal  New home meds for HLD  Recommend Lipitor 10 mg daily  Continue statin on discharge  Tobacco abuse  Current smoker  Smoking cessation counseling provided  Pt is willing to quit  Other Stroke Risk Factors  Advanced age  Morbid obesity, Body mass index is 35.6 kg/(m^2).   COPD with CO2 retention  Other Active Problems  Leukocytosis  Other Pertinent History  Hyponatremia  Hospital day # 5 Plan await TEE/ loop and hypercoag lab results  Delia Heady, MD MD PhD Stroke Neurology 06/22/2014 2:54 PM   To contact Stroke Continuity provider, please refer to [email or web address]. After hours, contact General Neurology

## 2014-06-22 NOTE — Progress Notes (Signed)
*  PRELIMINARY RESULTS* Vascular Ultrasound Carotid Duplex (Doppler) has been completed.  Preliminary findings: Bilateral:  1-39% ICA stenosis.  Vertebral artery flow is antegrade.      Farrel DemarkJill Eunice, RDMS, RVT  06/22/2014, 12:43 PM

## 2014-06-23 ENCOUNTER — Encounter (HOSPITAL_COMMUNITY): Payer: Self-pay | Admitting: *Deleted

## 2014-06-23 ENCOUNTER — Encounter (HOSPITAL_COMMUNITY)
Admission: EM | Disposition: A | Discharge status: Rehabilitation Facility | Payer: Self-pay | Source: Home / Self Care | Attending: Internal Medicine

## 2014-06-23 DIAGNOSIS — I340 Nonrheumatic mitral (valve) insufficiency: Secondary | ICD-10-CM

## 2014-06-23 HISTORY — PX: TEE WITHOUT CARDIOVERSION: SHX5443

## 2014-06-23 LAB — GLUCOSE, CAPILLARY
GLUCOSE-CAPILLARY: 101 mg/dL — AB (ref 70–99)
Glucose-Capillary: 98 mg/dL (ref 70–99)
Glucose-Capillary: 121 mg/dL — ABNORMAL HIGH (ref 70–99)

## 2014-06-23 LAB — PARATHYROID HORMONE, INTACT (NO CA): PTH: 338 pg/mL — ABNORMAL HIGH (ref 15–65)

## 2014-06-23 SURGERY — ECHOCARDIOGRAM, TRANSESOPHAGEAL
Anesthesia: Moderate Sedation

## 2014-06-23 MED ORDER — FENTANYL CITRATE 0.05 MG/ML IJ SOLN
INTRAMUSCULAR | Status: AC
  Filled 2014-06-23: qty 2

## 2014-06-23 MED ORDER — MIDAZOLAM HCL 10 MG/2ML IJ SOLN
INTRAMUSCULAR | Status: DC | PRN
  Administered 2014-06-23 (×2): 2 mg via INTRAVENOUS

## 2014-06-23 MED ORDER — METOPROLOL TARTRATE 1 MG/ML IV SOLN
INTRAVENOUS | Status: AC
  Filled 2014-06-23: qty 5

## 2014-06-23 MED ORDER — MIDAZOLAM HCL 5 MG/ML IJ SOLN
INTRAMUSCULAR | Status: AC
  Filled 2014-06-23: qty 2

## 2014-06-23 MED ORDER — METOPROLOL TARTRATE 1 MG/ML IV SOLN
5.0000 mg | Freq: Four times a day (QID) | INTRAVENOUS | Status: DC | PRN
  Administered 2014-06-23 (×2): 5 mg via INTRAVENOUS
  Administered 2014-06-24: 2.5 mg via INTRAVENOUS
  Administered 2014-07-05: 5 mg via INTRAVENOUS
  Filled 2014-06-23 (×5): qty 5

## 2014-06-23 MED ORDER — SODIUM CHLORIDE 0.9 % IV SOLN
INTRAVENOUS | Status: DC
  Administered 2014-06-23: 500 mL via INTRAVENOUS

## 2014-06-23 MED ORDER — FENTANYL CITRATE 0.05 MG/ML IJ SOLN
INTRAMUSCULAR | Status: DC | PRN
  Administered 2014-06-23: 25 ug via INTRAVENOUS

## 2014-06-23 MED ORDER — LORAZEPAM 0.5 MG PO TABS
1.0000 mg | ORAL_TABLET | Freq: Once | ORAL | Status: AC
  Administered 2014-06-23: 1 mg via ORAL
  Filled 2014-06-23: qty 2

## 2014-06-23 MED ORDER — PREDNISONE 20 MG PO TABS
40.0000 mg | ORAL_TABLET | Freq: Two times a day (BID) | ORAL | Status: DC
  Administered 2014-06-24 – 2014-06-30 (×12): 40 mg via ORAL
  Filled 2014-06-23 (×14): qty 2

## 2014-06-23 NOTE — Interval H&P Note (Signed)
History and Physical Interval Note:  06/23/2014 11:55 AM  [name]  has presented today for surgery, with the diagnosis of CVA  The various methods of treatment have been discussed with the patient and family. After consideration of risks, benefits and other options for treatment, the patient has consented to  Procedure(s): TRANSESOPHAGEAL ECHOCARDIOGRAM (TEE) (N/A) as a surgical intervention .  The patient's history has been reviewed, patient examined, no change in status, stable for surgery.  I have reviewed the patient's chart and labs.  Questions were answered to the patient's satisfaction.     Olga MillersBrian Crenshaw

## 2014-06-23 NOTE — Progress Notes (Signed)
NP made aware that pt is fully awake after TEE. Needs diet order.

## 2014-06-23 NOTE — CV Procedure (Signed)
See full TEE report in [employer]; normal LV function; no LAA thrombus; severe RAE; mild RVE; severely reduced RV function; severe TR; bowing of atrial septum right to left; positive saline microcavitation study. Note patient appeared to have frequent, intermittent atrial tachycardia during the study with HR 150-160. Olga MillersBrian Moriah Shawley

## 2014-06-23 NOTE — Progress Notes (Signed)
PT Cancellation Note  Patient Details Name: [name] MRN: [id number] DOB: [birth year]   Cancelled Treatment:    Reason Eval/Treat Not Completed: Patient at procedure or test/unavailable, will re attempt if able   [name], Scotti Motter J 06/23/2014, 12:31 PM Charlotte Crumbevon Aurthur Wingerter, PT DPT  [phone]

## 2014-06-23 NOTE — Progress Notes (Signed)
Pt complaining of overall just feeling bad, she does not know why but asked to make MD aware. Pt states she feels anxious and overall has not been sleeping good. Made NP aware of pts concerns.

## 2014-06-23 NOTE — Evaluation (Signed)
Physical Therapy Evaluation Patient Details Name: [name] MRN: [id number] DOB: February 01, 1956 Today's Date: 06/23/2014   History of Present Illness  [name] is a 59 y.o. female with a history of CKD and hypertension [facility]/16 complaining of shortness of breath and generalized weakness x 2 weeks. Admitted with acute hypoxemic respiratory failure. During hospital stay noted to have weakness of her left side. A head CT was completed, imaging reviewed, it shows an acute infarct in the right anterior cerebral artery territory  Clinical Impression  Patient demonstrates deficits in functional mobility as indicated below. Will need continued skilled PT to address deficits and maximize function. Will see as indicated and progress as tolerated. AT THIS TIME, patient is highly motivated and engaged, will need comprehensive therapies. Patient was independent prior to event. Highly recommend CIR.   Please order OT consultation.    Follow Up Recommendations CIR    Equipment Recommendations   (TBD)    Recommendations for Other Services Rehab consult;OT consult     Precautions / Restrictions Precautions Precautions: Fall Restrictions Weight Bearing Restrictions: No      Mobility  Bed Mobility Overal bed mobility: Needs Assistance;+2 for physical assistance Bed Mobility: Rolling;Supine to Sit;Sit to Supine Rolling: Mod assist   Supine to sit: Max assist Sit to supine: Max assist   General bed mobility comments: Patient with poor functional use of left side, assist for positioning and sequencing, assist to power up to EOB, patient able to initiate movement and demonstrates good determination to carry out tasks  Transfers Overall transfer level: Needs assistance Equipment used: 2 person hand held assist Transfers: Sit to/from Stand;Stand Pivot Transfers Sit to Stand: Max assist;+2 physical assistance;From elevated surface Stand pivot transfers: Max assist;+2  physical assistance;From elevated surface       General transfer comment: Patient able to power up from elevate surface with assist, initiating movement with right side, poor control and function through left side, LLE required Blocking during all transitional movements.   Ambulation/Gait                Stairs            Wheelchair Mobility    Modified Rankin (Stroke Patients Only)       Balance Overall balance assessment: Needs assistance Sitting-balance support: Feet supported Sitting balance-Leahy Scale: Poor Sitting balance - Comments: poor ability to maintain midline without cues, patient with left lateral lean, able to elicit self correction when verbally cued Postural control: Left lateral lean Standing balance support: During functional activity Standing balance-Leahy Scale: Zero                               Pertinent Vitals/Pain Pain Assessment: No/denies pain    Home Living Family/patient expects to be discharged to:: Inpatient rehab Living Arrangements: Children (daughter) Available Help at Discharge: Family Type of Home: Apartment Home Access: Stairs to enter   Secretary/administratorntrance Stairs-Number of Steps: 12 Home Layout: One level Home Equipment: None      Prior Function Level of Independence: Independent               Hand Dominance   Dominant Hand: Right    Extremity/Trunk Assessment   Upper Extremity Assessment: Defer to OT evaluation           Lower Extremity Assessment: LLE deficits/detail   LLE Deficits / Details: Gross motor assessment with 2/5 strength most  motions, assist required against gravity, decreased sensation  Cervical / Trunk Assessment:  (difficulty recognizing midline)  Communication   Communication: No difficulties  Cognition Arousal/Alertness: Awake/alert Behavior During Therapy: WFL for tasks assessed/performed Overall Cognitive Status: No family/caregiver present to determine baseline cognitive  functioning                      General Comments      Exercises        Assessment/Plan    PT Assessment Patient needs continued PT services  PT Diagnosis Difficulty walking;Hemiplegia non-dominant side   PT Problem List Decreased strength;Decreased range of motion;Decreased activity tolerance;Decreased balance;Decreased mobility;Decreased coordination;Impaired sensation;Obesity  PT Treatment Interventions DME instruction;Gait training;Stair training;Functional mobility training;Therapeutic activities;Therapeutic exercise;Balance training;Neuromuscular re-education;Patient/family education   PT Goals (Current goals can be found in the Care Plan section) Acute Rehab PT Goals Patient Stated Goal: to get back to how she was PT Goal Formulation: With patient Time For Goal Achievement: 07/07/14 Potential to Achieve Goals: Good    Frequency Min 4X/week   Barriers to discharge        Co-evaluation               End of Session Equipment Utilized During Treatment: Gait belt Activity Tolerance: Patient limited by fatigue Patient left: in bed;with call bell/phone within reach;with nursing/sitter in room Nurse Communication: Mobility status         Time: 0865-7846 PT Time Calculation (min) (ACUTE ONLY): 24 min   Charges:   PT Evaluation $Initial PT Evaluation Tier I: 1 Procedure PT Treatments $Therapeutic Activity: 8-22 mins   PT G CodesFabio Asa 07/09/2014, 4:16 PM Charlotte Crumb, PT DPT  [phone]

## 2014-06-23 NOTE — Progress Notes (Signed)
STROKE TEAM PROGRESS NOTE   HISTORY [name] is a 59 y.o. female with a history of CKD and hypertension [facility] complaining of shortness of breath and generalized weakness x 2 weeks. Admitted with acute hypoxemic respiratory failure. During hospital stay noted to have weakness of her left side. 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   Patient was not administered TPA secondary to late presentation.   SUBJECTIVE (INTERVAL HISTORY) Her family is not at the bedside.  Overall she feels her condition is unchanged, patient on way to TEE  OBJECTIVE Temp:  [97.3 F (36.3 C)-99.2 F (37.3 C)] 98.2 F (36.8 C) (03/22 1300) Pulse Rate:  [60-166] 156 (03/22 1300) Cardiac Rhythm:  [-] Sinus tachycardia (03/22 0800) Resp:  [14-26] 26 (03/22 1300) BP: (120-173)/(80-135) 126/86 mmHg (03/22 1300) SpO2:  [90 %-100 %] 100 % (03/22 1300) Weight:  [220 lb 7.4 oz (100 kg)-221 lb 12.5 oz (100.6 kg)] 221 lb 12.5 oz (100.6 kg) (03/21 1743)   Recent Labs Lab 06/22/14 0833 06/22/14 1224 06/22/14 2222 06/23/14 0825 06/23/14 1301  GLUCAP 136* 143* 143* 98 101*    Recent Labs Lab 06/17/14 1707 06/18/14 0405 06/19/14 0232 06/20/14 0850 06/22/14 0405  NA 138 131* 134* 134* 138  K 5.5* 5.4* 4.3 3.8 3.7  CL 107 103 97 95* 98  CO2 18* 15* 22 25 26   GLUCOSE 104* 111* 218* 125* 135*  BUN 64* 73* 60* 45* 64*  CREATININE 5.47* 5.86* 6.12* 5.77* 5.63*  CALCIUM 8.2* 7.8* 7.0* 7.4* 8.8  PHOS  --  7.8*  --  6.5*  --     Recent Labs Lab 06/18/14 0405 06/19/14 0232 06/20/14 0850  AST  --  246* 89*  ALT  --  209* 148*  ALKPHOS  --  156* 151*  BILITOT  --  0.7 0.8  PROT  --  6.3 6.4  ALBUMIN 2.9* 2.4* 2.5*    Recent Labs Lab 06/17/14 1707 06/18/14 0240 06/19/14 0232 06/20/14 0850 06/22/14 0405  WBC 7.0 6.8 8.5 13.9* 9.7  NEUTROABS 4.2  --   --   --   --   HGB 12.4 12.6 11.8* 12.3 12.5   HCT 39.7 39.5 37.7 38.2 39.0  MCV 83.6 82.8 81.6 80.4 79.8  PLT 305 320 298 289 319    Recent Labs Lab 06/17/14 2052 06/18/14 0240 06/18/14 0844 06/20/14 2037  TROPONINI 1.13* 0.90* 0.69* 0.68*   No results for input(s): LABPROT, INR in the last 72 hours. No results for input(s): COLORURINE, LABSPEC, PHURINE, GLUCOSEU, HGBUR, BILIRUBINUR, KETONESUR, PROTEINUR, UROBILINOGEN, NITRITE, LEUKOCYTESUR in the last 72 hours.  Invalid input(s): APPERANCEUR     Component Value Date/Time   CHOL 156 06/20/2014 2037   TRIG 294* 06/20/2014 2037   HDL 20* 06/20/2014 2037   CHOLHDL 7.8 06/20/2014 2037   VLDL 59* 06/20/2014 2037   LDLCALC 77 06/20/2014 2037   Lab Results  Component Value Date   HGBA1C 6.5* 06/20/2014   No results found for: LABOPIA, COCAINSCRNUR, LABBENZ, AMPHETMU, THCU, LABBARB  No results for input(s): ETH in the last 168 hours.   I have personally reviewed the radiological images below and agree with the radiology interpretations.  Ct Head Wo Contrast 06/19/2014    Acute infarct right anterior cerebral artery territory.    [place] Renal 06/18/2014    Indeterminate 1.9 cm hypoechoic mass off the interpolar region of the  left kidney. Evaluation is markedly limited due to body habitus. This may potentially represent a cyst however solid mass is not excluded. Recommend follow-up in the outpatient setting with either pre and post enhanced CT or MRI.  Echogenic right kidney most compatible with chronic medical renal disease.  No hydronephrosis.  Fluid within the pelvis.    2-D echo - Normal LV function; grade 1 diastolic dysfunction; systolic and diastolic septal flattening; moderate RVE with severely reduced function; severe RAE with right to left septal bowing; severe TR; findings consistent with severely elevated pulmonary pressure.  LE venous Doppler - There is no obvious evidence of DVT or SVT noted in the bilateral lower extremities. Doppler waveforms are  pulsatile suggestive of fluid overload.  MRI and MRA Multifocal areas of acute infarction throughout both hemispheres, with the largest confluent area involving the RIGHT medial parasagittal frontal and posterior frontal cortex, distal ACA territory. No proximal large vessel occlusion on MRA intracranial exam.  CUS   : Bilateral: 1-39% ICA stenosis. Vertebral artery flow is antegrade.     PHYSICAL EXAM  Temp:  [97.3 F (36.3 C)-99.2 F (37.3 C)] 98.2 F (36.8 C) (03/22 1300) Pulse Rate:  [60-166] 156 (03/22 1300) Resp:  [14-26] 26 (03/22 1300) BP: (120-173)/(80-135) 126/86 mmHg (03/22 1300) SpO2:  [90 %-100 %] 100 % (03/22 1300) Weight:  [220 lb 7.4 oz (100 kg)-221 lb 12.5 oz (100.6 kg)] 221 lb 12.5 oz (100.6 kg) (03/21 1743)  General - obese middle aged lady, well developed, in no apparent distress, lethargic.  Ophthalmologic - fundi not visualized due to incorporation.  Cardiovascular - irregularly irregular rate and rhythm.  Mental Status -  Level of arousal and orientation to months, place, and person were intact, but not to year. Language including expression, naming, repetition, comprehension was assessed and found intact. Fund of Knowledge was assessed and was impaired,    Cranial Nerves II - XII - II - Visual field intact OU. III, IV, VI - Extraocular movements intact. V - Facial sensation intact bilaterally. VII - Mild left facial droop,   VIII - Hearing & vestibular intact bilaterally. X - Palate elevates symmetrically. XI - Chin turning & shoulder shrug intact bilaterally. XII - Tongue protrusion intact.  Motor Strength - The patient's strength was 3+/5 LUE and 0/5 without pain at LLE. RUE and RLE 5-/5. Bulk was normal and fasciculations were absent.   Motor Tone - Muscle tone was assessed at the neck and appendages and was normal.  Reflexes - The patient's reflexes were 3+ bilateral biceps and patellar reflexes,   and she had no pathological  reflexes.  Sensory - Light touch, temperature/pinprick were assessed and were normal.    Coordination - The patient had normal movements in the right hand with no ataxia or dysmetria.  Tremor was absent.  Gait and Station - not tested due to safety concerns.  ASSESSMENT/PLAN [name] is a 59 y.o. female with history of HTN, ESRD recently on HD, COPD presenting with generalized weakness and fluid overload. Started on hemodialysis. However she was found to have left-sided weakness after admission. CT head concerning for right ACA infarct. MRI showed bilateral infarcts, embolic pattern.  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  Resultant  left facial droop, left hemiparesis  MRI  embolic infarcts involving right ACA, left PCA and MCA  MRA  unremarkable Carotid Doppler  Bilateral: 1-39% ICA stenosis. Vertebral artery flow is antegrade.  2D Echo  unremarkable  LE Venous Doppler no DVT  Due to concern of cardioembolic stroke, recommend TEE evaluation for cardiac source of emboli. If negative, recommend loop recorder to rule out atrial fibrillation.  LDL 77, not at goal  HgbA1c pending  Hypercoagulable workup ordered and pending  Heparin sq for VTE prophylaxis  Diet NPO time specified Except for: Sips with Meds   no antithrombotic prior to admission, now on aspirin 81 mg orally every day. Recommend full dose aspirin (ordered).  Patient counseled to be compliant with her antithrombotic medications  Ongoing aggressive stroke risk factor management  Some hypercoagulable labs pending  Therapy recommendations:  Pending  Disposition:  Pending  ESRD recently on HD  Nephrology on board  On hemodialysis  Flu  Droplet isolation  On Tamiflu  Hypertension  Home meds:   Lasix  Stable Permissive hypertension (OK if <220/120) for 24-48 hours post stroke and then gradually normalized within  5-7 days.  Patient counseled to be compliant with her blood pressure medications  Hyperlipidemia  LDL 77, not at goal  New home meds for HLD  Recommend Lipitor 10 mg daily  Continue statin on discharge  Tobacco abuse  Current smoker  Smoking cessation counseling provided  Pt is willing to quit  Other Stroke Risk Factors  Advanced age  Morbid obesity, Body mass index is 35.81 kg/(m^2).   COPD with CO2 retention  Other Active Problems  Leukocytosis  Other Pertinent History  Hyponatremia  Hospital day # 6 Plan await TEE/ loop and hypercoag lab results  Delia Heady, MD MD PhD Stroke Neurology 06/23/2014 1:49 PM   To contact Stroke Continuity provider, please refer to [email or web address]. After hours, contact General Neurology

## 2014-06-23 NOTE — Progress Notes (Addendum)
PROGRESS NOTE  [name] ONG:[id number] DOB: 04-11-1955 DOA: 06/17/2014 PCP: Willey BladeEAN, [name], MD  HPI/Recap of past 24 hours: Patient is a 59 year old female with past mental history of stage V chronic kidney disease status post fistula placement although not yet on hemodialysis as well as hypertension, tobacco use and obesity who was admitted at [facility] on 3/16 for shortness of breath and weakness 2 weeks. Patient [facility] and found to have uncompensated metabolic acidosis from worsening uremia, H1N1 flu. Renal ultrasound done noted mass off of left kidney. Patient transferred to [facility] for dialysis which greatly improved her breathing. On 3/18 patient noted to be hemiparetic on the left side and a CT scan noted an infarct of the right ACA and neurology consulted. Patient since stabilized and transferred to step down unit to hospitalist service.  Patient's MRI notes focal areas of acute infarction throughout both hemispheres, consistent with an embolic phenomenon.  Patient underwent stroke workup which had noted a negative TEE for clot, negative carotid Dopplers although she has had episodes of a flutter. She will likely need chronic long-term anticoagulation.   Breathing continues to improve and patient is down to approximately 3 L. She has continued to receive dialysis, followed by nephrology  Assessment/Plan: Principal Problem:   Acute respiratory failure with hypoxia: Oxygen requirements slowly decreasing, Chest x-ray clear. This is multifactorial from influenza, chronic tobacco use an underlying COPD and volume overload.  COPD with acute exacerbation: Given echocardiogram findings, suspect that likely this is cor pulmonale. Started with IV steroids and had been tapering down since. Continue nebulizers. 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 Started on daily aspirin. TEE unrevealing. Dopplers unrevealing. Likely cause felt to be embolic phenomenon from arrhythmia. Plan will be for an event recorder, although urine hospitalization patient has had episodes of atrial flutter.  Atrial flutter: Added IV Lopressor when necessary plus by mouth beta blocker    Hypertension: Continued on home medications.    ESRD needing dialysis: Seen by nephrology. They feel that long-term she may be a poor candidate for continuing dialysis. Status post 2 sessions.fistulogram noted dilatation of cephalic vein beyond arterial anastomoses.     Obesity (BMI 30-39.9): Patient is criteria with BMI greater than 30.    Depression: Continue SSRI    Left renal mass: Once patient more stabilized, will check dedicated renal MRI    Chronic diastolic heart failure: Noted on echocardiogram. Grade 1. Managed by dialysis.    H1N1 influenza: Completed course of Tamiflu on 3/21    Tobacco use disorder: Counseled   Code Status: Full code  Family Communication: Left message for daughter  Disposition Plan: Transfer out of stepdown.  PT recommending inpatient rehabilitation. We'll get OT eval   Consultants:  Nephrology  Critical care  Neurology  Procedures:  Echocardiogram done 3/17 noting grade 1 diastolic dysfunction  TEE noting no evidence of clot, +RV heart failure  Antibiotics: Tamiflu 3/18-3/21  Objective: BP 134/98 mmHg  Pulse 84  Temp(Src) 98.2 F (36.8 C) (Oral)  Resp 18  Ht 5\' 6"  (1.676 m)  Wt 100.6 kg (221 lb 12.5 oz)  BMI 35.81 kg/m2  SpO2 95%  Intake/Output Summary (Last 24 hours) at 06/23/14 1811 Last data filed at 06/23/14 1649  Gross per 24 hour  Intake    360 ml  Output  350 ml  Net     10 ml   Filed Weights   06/20/14 0300 06/22/14 1356 06/22/14 1743  Weight: 97.3 kg (214 lb 8.1 oz) 100 kg (220 lb 7.4 oz) 100.6 kg (221 lb 12.5 oz)    Exam:   General:  Alert and  oriented 3, fatigue  Cardiovascular  regular rhythm, tachycardic consistent with atrial flutter on monitor  Respiratory: Decreased breath sounds throughout, wheezes resolved  Abdomen: Soft, obese, nontender, hypoactive bowel sounds  Musculoskeletal: No clubbing or cyanosis, 1+ pitting edema bilaterally  Neurologic: Noted left side hemiparesis   Data Reviewed: Basic Metabolic Panel:  Recent Labs Lab 06/17/14 1707 06/18/14 0405 06/19/14 0232 06/20/14 0850 06/22/14 0405  NA 138 131* 134* 134* 138  K 5.5* 5.4* 4.3 3.8 3.7  CL 107 103 97 95* 98  CO2 18* 15* GLUCOSE 104* 111* 218* 125* 135*  BUN 64* 73* 60* 45* 64*  CREATININE 5.47* 5.86* 6.12* 5.77* 5.63*  CALCIUM 8.2* 7.8* 7.0* 7.4* 8.8  PHOS  --  7.8*  --  6.5*  --    Liver Function Tests:  Recent Labs Lab 06/18/14 0405 06/19/14 0232 06/20/14 0850  AST  --  246* 89*  ALT  --  209* 148*  ALKPHOS  --  156* 151*  BILITOT  --  0.7 0.8  PROT  --  6.3 6.4  ALBUMIN 2.9* 2.4* 2.5*   No results for input(s): LIPASE, AMYLASE in the last 168 hours. No results for input(s): AMMONIA in the last 168 hours. CBC:  Recent Labs Lab 06/17/14 1707 06/18/14 0240 06/19/14 0232 06/20/14 0850 06/22/14 0405  WBC 7.0 6.8 8.5 13.9* 9.7  NEUTROABS 4.2  --   --   --   --   HGB 12.4 12.6 11.8* 12.3 12.5  HCT 39.7 39.5 37.7 38.2 39.0  MCV 83.6 82.8 81.6 80.4 79.8  PLT 305 320 298 289 319   Cardiac Enzymes:    Recent Labs Lab 06/17/14 2052 06/18/14 0240 06/18/14 0844 06/20/14 2037  TROPONINI 1.13* 0.90* 0.69* 0.68*   BNP (last 3 results)  Recent Labs  06/17/14 2043  BNP 1378.8*    ProBNP (last 3 results) No results for input(s): PROBNP in the last 8760 hours.  CBG:  Recent Labs Lab 06/22/14 1224 06/22/14 2222 06/23/14 0825 06/23/14 1301 06/23/14 1659  GLUCAP 143* 143* 98 101* 121*    Recent Results (from the past 240 hour(s))  MRSA PCR Screening     Status: None   Collection Time: 06/17/14   9:29 PM  Result Value Ref Range Status   MRSA by PCR NEGATIVE NEGATIVE Final    Comment:        The GeneXpert MRSA Assay (FDA approved for NASAL specimens only), is one component of a comprehensive MRSA colonization surveillance program. It is not intended to diagnose MRSA infection nor to guide or monitor treatment for MRSA infections.   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  EXAM: DIALYSIS AV FISTULOGRAM  COMPARISON:  None.  CONTRAST:  50mL OMNIPAQUE IOHEXOL 300 MG/ML  SOLN  FLUOROSCOPY TIME:  24 seconds.  PROCEDURE: The procedure, risks, benefits, and alternatives  were explained to the patient. Questions regarding the procedure were encouraged and answered. The patient understands and consents to the procedure.  The left arm native fistula was prepped with Betadine in a sterile fashion, and a sterile drape was applied covering the operative field. A diagnostic fistulogram was performed via an 18 gauge angiocatheter introduced into venous outflow. Venous drainage was assessed to the level of the central veins in the chest. Proximal fistula was studied by reflux maneuver with temporary compression of venous outflow. After the procedure, the angiocatheter was removed and hemostasis obtained with manual compression.  COMPLICATIONS: None  FINDINGS: Antecubital anastomosis between the brachial artery and cephalic vein is normally patent. After a short segment, there is focal aneurysmal dilatation of the vein in the antecubital fossa. There is competing venous outflow noted in the cephalic vein as well as the deep venous system. The cephalic vein is tortuous but shows no significant focal stenoses. Central veins are  normally patent, including the SVC.  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 There is competing venous outflow in the deep venous system of the upper arm.  ACCESS: No percutaneous intervention was indicated today.   Electronically Signed   By: Irish Lack M.D.   On: 06/22/2014 13:20    Scheduled Meds: . antiseptic oral rinse  7 mL Mouth Rinse BID  . aspirin  325 mg Oral Daily  . calcium acetate  667 mg Oral TID WC  . citalopram  20 mg Oral Daily  . fenofibrate  160 mg Oral Daily  . heparin subcutaneous  5,000 Units Subcutaneous 3 times per day  . ipratropium-albuterol  3 mL Nebulization TID  . methylPREDNISolone (SOLU-MEDROL) injection  40 mg Intravenous Q12H  . multivitamin  1 tablet Oral QHS    Continuous Infusions:    Time spent: 35 minutes  Hollice Espy  [facility] Pager [phone]. If 7PM-7AM, please contact night-coverage at [email or web address], password [facility] 06/23/2014, 6:11 PM  LOS: 6 days

## 2014-06-23 NOTE — Progress Notes (Addendum)
Subjective:   L arm sore, no complaints  Objective Filed Vitals:   06/23/14 1234 06/23/14 1240 06/23/14 1300 06/23/14 1351  BP: 129/81  126/86   Pulse: 114 114 156   Temp: 99.2 F (37.3 C)  98.2 F (36.8 C)   TempSrc: Oral  Oral   Resp: 22  26   Height:      Weight:      SpO2: 100% 100% 100% 98%   Physical Exam General: alert and oriented, no acute distress. On HD Heart: RRR, tachy Lungs: CTA unlabored Abdomen: soft, nontender +BS Extremities: no edema Dialysis Access: L AVF patent on HD  Assessment/Plan: 1. ESRD -Initiated on dialysis on 3/17.  2. CVA- acute infarct in right anterior cerebral artery territory. Neurology following. Carotid doppler 1-39% stenosis in R and L carotid artery. ASA  LLE > LUE weakness. For TEE today 3. Anemia - hgb 12.5, no ESA, watch CBC 4. Secondary hyperparathyroidism -ca+ 8.8  phos 6.5 phoslo started 5 HTN/volume - vol looks good. Holding home norvasc/ lopressor 6. Nutrition - alb 2.5 renal diet. Vitamin. nepro 7. COPD- still smoking 8. Flu- Influenza A / H1N1- per primary 9.  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 There is competing venous outflow in the deep venous system of the upper arm.- no intervention done    [name] MD (pgr) [phone]    ([phone] 06/23/2014, 2:36 PM        Additional Objective Labs: Basic Metabolic Panel:  Recent Labs Lab 06/18/14 0405 06/19/14 0232 06/20/14 0850 06/22/14 0405  NA 131* 134* 134* 138  K 5.4* 4.3 3.8 3.7  CL 103 97 95* 98  CO2 15* GLUCOSE 111* 218* 125* 135*  BUN 73* 60* 45* 64*  CREATININE 5.86* 6.12* 5.77* 5.63*  CALCIUM 7.8* 7.0* 7.4* 8.8  PHOS 7.8*  --  6.5*  --    Liver Function Tests:  Recent Labs Lab 06/18/14 0405 06/19/14 0232 06/20/14 0850  AST  --  246* 89*  ALT  --  209* 148*  ALKPHOS  --  156* 151*  BILITOT  --  0.7 0.8  PROT  --  6.3 6.4  ALBUMIN  2.9* 2.4* 2.5*   No results for input(s): LIPASE, AMYLASE in the last 168 hours. CBC:  Recent Labs Lab 06/17/14 1707 06/18/14 0240 06/19/14 0232 06/20/14 0850 06/22/14 0405  WBC 7.0 6.8 8.5 13.9* 9.7  NEUTROABS 4.2  --   --   --   --   HGB 12.4 12.6 11.8* 12.3 12.5  HCT 39.7 39.5 37.7 38.2 39.0  MCV 83.6 82.8 81.6 80.4 79.8  PLT 305 320 298 289 319   Blood Culture No results found for: SDES, SPECREQUEST, CULT, REPTSTATUS  Cardiac Enzymes:  Recent Labs Lab 06/17/14 2052 06/18/14 0240 06/18/14 0844 06/20/14 2037  TROPONINI 1.13* 0.90* 0.69* 0.68*   CBG:  Recent Labs Lab 06/22/14 0833 06/22/14 1224 06/22/14 2222 06/23/14 0825 06/23/14 1301  GLUCAP 136* 143* 143* 98 101*   Iron Studies: No results for input(s): IRON, TIBC, TRANSFERRIN, FERRITIN in the last 72 hours. 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  EXAM: DIALYSIS AV FISTULOGRAM  COMPARISON:  None.  CONTRAST:  50mL OMNIPAQUE IOHEXOL 300 MG/ML  SOLN  FLUOROSCOPY TIME:  24 seconds.  PROCEDURE: The procedure,  risks, benefits, and alternatives were explained to the patient. Questions regarding the procedure were encouraged and answered. The patient understands and consents to the procedure.  The left arm native fistula was prepped with Betadine in a sterile fashion, and a sterile drape was applied covering the operative field. A diagnostic fistulogram was performed via an 18 gauge angiocatheter introduced into venous outflow. Venous drainage was assessed to the level of the central veins in the chest. Proximal fistula was studied by reflux maneuver with temporary compression of venous outflow. After the procedure, the angiocatheter was removed and hemostasis obtained with manual compression.  COMPLICATIONS: None  FINDINGS: Antecubital anastomosis between the brachial artery and  cephalic vein is normally patent. After a short segment, there is focal aneurysmal dilatation of the vein in the antecubital fossa. There is competing venous outflow noted in the cephalic vein as well as the deep venous system. The cephalic vein is tortuous but shows no significant focal stenoses. Central veins are normally patent, including the SVC.  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 There is competing venous outflow in the deep venous system of the upper arm.  ACCESS: No percutaneous intervention was indicated today.   Electronically Signed   By: Irish LackGlenn  Yamagata M.D.   On: 06/22/2014 13:20   Medications: . sodium chloride 500 mL (06/23/14 1051)   . antiseptic oral rinse  7 mL Mouth Rinse BID  . aspirin  325 mg Oral Daily  . calcium acetate  667 mg Oral TID WC  . citalopram  20 mg Oral Daily  . fenofibrate  160 mg Oral Daily  . heparin subcutaneous  5,000 Units Subcutaneous 3 times per day  . ipratropium-albuterol  3 mL Nebulization TID  . methylPREDNISolone (SOLU-MEDROL) injection  40 mg Intravenous Q12H  . multivitamin  1 tablet Oral QHS

## 2014-06-23 NOTE — Progress Notes (Signed)
*  PRELIMINARY RESULTS* Echocardiogram 2D Echocardiogram has been performed.  [name], [name] 06/23/2014, 1:19 PM

## 2014-06-23 NOTE — H&P (View-Only) (Signed)
PROGRESS NOTE  [name] ZOX:[id number] DOB: [birth year] DOA: 06/17/2014 PCP: [name], MD  HPI/Recap of past 24 hours: Patient is a 59 year old female with past mental history of stage V chronic kidney disease status post fistula placement although not yet on hemodialysis as well as hypertension, tobacco use and obesity who was admitted at [facility] on 3/16 for shortness of breath and weakness 2 weeks. Patient admitted to [facility] service and found to have uncompensated metabolic acidosis from worsening uremia, H1N1 flu. Renal ultrasound done noted mass off of left kidney. Patient transferred to [facility] for dialysis which greatly improved her breathing. On 3/18 patient noted to be hemiparetic on the left side and a CT scan noted an infarct of the right ACA and neurology consulted. Patient since stabilized and transferred to step down unit to hospitalist service.  Patient's MRI notes focal areas of acute infarction throughout both hemispheres, consistent with an embolic phenomenon.  . Still requiring some oxygen, but has been staying around 4.5 L, improved from yesterday. Unable to tolerate dialysis today because of significantly elevated blood pressures in part from volume overload as well as recent stroke  Assessment/Plan: Principal Problem:   Acute respiratory failure with hypoxia: Oxygen requirements slowly decreasing, Chest x-ray clear. Likely this is multifactorial from influenza, chronic tobacco use an underlying COPD and volume overload.  COPD with acute exacerbation: Given echocardiogram findings, suspect that likely this is cor pulmonale. Have added IV steroids. Continue nebulizers. 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 Started on daily aspirin. Cardiology consulted for TEE. Dopplers unrevealing    Hypertension:  Continued on home medications.    ESRD needing dialysis: Seen by nephrology. They feel that long-term she may be a poor candidate for continuing dialysis. Status post 2 sessions.fistulogram noted dilatation of cephalic vein beyond arterial anastomoses. Unable to get dialysis today because of elevated blood pressure   Obesity (BMI 30-39.9): Patient is criteria with BMI greater than 30.    Depression: Continue SSRI    Left renal mass: Once patient more stabilized, will check dedicated renal MRI    Chronic diastolic heart failure: Noted on echocardiogram. Grade 1. Managed by dialysis.    H1N1 influenza: Positive screen started on Tamiflu    Tobacco use disorder: Counseled   Code Status: Full code  Family Communication: Left message for daughter  Disposition Plan: continue step down unit until oxygen status improves    Consultants:  Nephrology  Critical care  Neurology  Procedures:  Echocardiogram done 3/17 noting grade 1 diastolic dysfunction  Antibiotics: Tamiflu 3/18-present  Objective: BP 147/116 mmHg  Pulse 98  Temp(Src) 97.3 F (36.3 C) (Oral)  Resp 20  Ht  (1.676 m)  Wt 100.6 kg (221 lb 12.5 oz)  BMI 35.81 kg/m2  SpO2 96%  Intake/Output Summary (Last 24 hours) at 06/22/14 1843 Last data filed at 06/22/14 1743  Gross per 24 hour  Intake     60 ml  Output    900 ml  Net   -840 ml   Filed Weights   06/20/14 0300 06/22/14 1356 06/22/14 1743  Weight: 97.3 kg (214 lb 8.1 oz) 100 kg (220 lb 7.4 oz) 100.6 kg (221 lb 12.5 oz)    Exam:   General:  Alert and oriented 3, fatigue  Cardiovascular  Regular rate and rhythm, S1-S2, 2/6  systolic ejection murmur, borderline tachycardia  Respiratory: Decreased breath sounds throughout, scattered wheezes   Abdomen: Soft, obese, nontender, hypoactive bowel sounds  Musculoskeletal: No clubbing or cyanosis, 1+ pitting edema bilaterally  Neurologic: Noted left side hemiparesis   Data Reviewed: Basic  Metabolic Panel:  Recent Labs Lab 06/17/14 1707 06/18/14 0405 06/19/14 0232 06/20/14 0850 06/22/14 0405  NA 138 131* 134* 134* 138  K 5.5* 5.4* 4.3 3.8 3.7  CL 107 103 97 95* 98  CO2 18* 15* 22 25 26   GLUCOSE 104* 111* 218* 125* 135*  BUN 64* 73* 60* 45* 64*  CREATININE 5.47* 5.86* 6.12* 5.77* 5.63*  CALCIUM 8.2* 7.8* 7.0* 7.4* 8.8  PHOS  --  7.8*  --  6.5*  --    Liver Function Tests:  Recent Labs Lab 06/18/14 0405 06/19/14 0232 06/20/14 0850  AST  --  246* 89*  ALT  --  209* 148*  ALKPHOS  --  156* 151*  BILITOT  --  0.7 0.8  PROT  --  6.3 6.4  ALBUMIN 2.9* 2.4* 2.5*   No results for input(s): LIPASE, AMYLASE in the last 168 hours. No results for input(s): AMMONIA in the last 168 hours. CBC:  Recent Labs Lab 06/17/14 1707 06/18/14 0240 06/19/14 0232 06/20/14 0850 06/22/14 0405  WBC 7.0 6.8 8.5 13.9* 9.7  NEUTROABS 4.2  --   --   --   --   HGB 12.4 12.6 11.8* 12.3 12.5  HCT 39.7 39.5 37.7 38.2 39.0  MCV 83.6 82.8 81.6 80.4 79.8  PLT 305 320 298 289 319   Cardiac Enzymes:    Recent Labs Lab 06/17/14 2052 06/18/14 0240 06/18/14 0844 06/20/14 2037  TROPONINI 1.13* 0.90* 0.69* 0.68*   BNP (last 3 results)  Recent Labs  06/17/14 2043  BNP 1378.8*    ProBNP (last 3 results) No results for input(s): PROBNP in the last 8760 hours.  CBG:  Recent Labs Lab 06/21/14 1307 06/21/14 1708 06/21/14 2133 06/22/14 0833 06/22/14 1224  GLUCAP 78 107* 123* 136* 143*    Recent Results (from the past 240 hour(s))  MRSA PCR Screening     Status: None   Collection Time: 06/17/14  9:29 PM  Result Value Ref Range Status   MRSA by PCR NEGATIVE NEGATIVE Final    Comment:        The GeneXpert MRSA Assay (FDA approved for NASAL specimens only), is one component of a comprehensive MRSA colonization surveillance program. It is not intended to diagnose MRSA infection nor to guide or monitor treatment for MRSA infections.   Clostridium Difficile by PCR      Status: None   Collection Time: 06/18/14  3:52 AM  Result Value Ref Range Status   C difficile by pcr NEGATIVE NEGATIVE Final     Studies: No results found.  Scheduled Meds: . antiseptic oral rinse  7 mL Mouth Rinse BID  . aspirin  325 mg Oral Daily  . atorvastatin  10 mg Oral q1800  . calcium acetate  667 mg Oral TID WC  . citalopram  20 mg Oral Daily  . fenofibrate  160 mg Oral Daily  . heparin subcutaneous  5,000 Units Subcutaneous 3 times per day  . ipratropium-albuterol  3 mL Nebulization TID  . methylPREDNISolone (SOLU-MEDROL) injection  40 mg Intravenous Q12H  . multivitamin  1 tablet Oral QHS    Continuous Infusions:    Time spent: 25 minutes  Hollice EspyKRISHNAN,Kambry Takacs K  [facility] Pager [phone]. If 7PM-7AM,  please contact night-coverage at [email or web address], password [facility] 06/22/2014, 6:43 PM  LOS: 5 days

## 2014-06-24 ENCOUNTER — Encounter (HOSPITAL_COMMUNITY): Payer: Self-pay | Admitting: Cardiology

## 2014-06-24 DIAGNOSIS — I639 Cerebral infarction, unspecified: Secondary | ICD-10-CM | POA: Insufficient documentation

## 2014-06-24 DIAGNOSIS — F329 Major depressive disorder, single episode, unspecified: Secondary | ICD-10-CM

## 2014-06-24 DIAGNOSIS — I471 Supraventricular tachycardia: Secondary | ICD-10-CM | POA: Diagnosis not present

## 2014-06-24 DIAGNOSIS — G8190 Hemiplegia, unspecified affecting unspecified side: Secondary | ICD-10-CM

## 2014-06-24 LAB — BETA-2-GLYCOPROTEIN I ABS, IGG/M/A
Beta-2 Glyco I IgG: <9 GPI IgG units (ref 0–20)
Beta-2-Glycoprotein I IgA: <9 GPI IgA units (ref 0–25)
Beta-2-Glycoprotein I IgM: <9 GPI IgM units (ref 0–32)

## 2014-06-24 LAB — CBC
HCT: 38.8 % (ref 36.0–46.0)
Hemoglobin: 12.5 g/dL (ref 12.0–15.0)
MCH: 26 pg (ref 26.0–34.0)
MCHC: 32.2 g/dL (ref 30.0–36.0)
MCV: 80.7 fL (ref 78.0–100.0)
Platelets: 299 10*3/uL (ref 150–400)
RBC: 4.81 MIL/uL (ref 3.87–5.11)
RDW: 18.7 % — ABNORMAL HIGH (ref 11.5–15.5)
WBC: 11.9 10*3/uL — ABNORMAL HIGH (ref 4.0–10.5)

## 2014-06-24 LAB — GLUCOSE, CAPILLARY: Glucose-Capillary: 70 mg/dL (ref 70–99)

## 2014-06-24 LAB — RENAL FUNCTION PANEL
ANION GAP: 11 (ref 5–15)
Albumin: 2.6 g/dL — ABNORMAL LOW (ref 3.5–5.2)
BUN: 75 mg/dL — AB (ref 6–23)
CALCIUM: 8.6 mg/dL (ref 8.4–10.5)
CO2: 27 mmol/L (ref 19–32)
Chloride: 101 mmol/L (ref 96–112)
Creatinine, Ser: 4.33 mg/dL — ABNORMAL HIGH (ref 0.50–1.10)
GFR calc Af Amer: 12 mL/min — ABNORMAL LOW (ref >90)
GFR calc non Af Amer: 10 mL/min — ABNORMAL LOW (ref >90)
GLUCOSE: 75 mg/dL (ref 70–99)
PHOSPHORUS: 5.8 mg/dL — AB (ref 2.3–4.6)
POTASSIUM: 4.7 mmol/L (ref 3.5–5.1)
Sodium: 139 mmol/L (ref 135–145)

## 2014-06-24 LAB — LUPUS ANTICOAGULANT PANEL
DRVVT: 31.3 s (ref 0.0–55.1)
PTT Lupus Anticoagulant: 38.6 s (ref 0.0–50.0)

## 2014-06-24 LAB — CARDIOLIPIN ANTIBODIES, IGG, IGM, IGA
Anticardiolipin IgA: <9 U/mL (ref 0–11)
Anticardiolipin IgG: <9 GPL U/mL (ref 0–14)
Anticardiolipin IgM: <9 [MPL'U]/mL (ref 0–12)

## 2014-06-24 LAB — MAGNESIUM: Magnesium: 2 mg/dL (ref 1.5–2.5)

## 2014-06-24 MED ORDER — PENTAFLUOROPROP-TETRAFLUOROETH EX AERO: 1.0000 "application " | INHALATION_SPRAY | CUTANEOUS | Status: DC | PRN

## 2014-06-24 MED ORDER — METOPROLOL TARTRATE 1 MG/ML IV SOLN
INTRAVENOUS | Status: AC
  Administered 2014-06-24: 5 mg via INTRAVENOUS
  Filled 2014-06-24: qty 5

## 2014-06-24 MED ORDER — METOPROLOL TARTRATE 1 MG/ML IV SOLN
5.0000 mg | INTRAVENOUS | Status: AC
  Administered 2014-06-24: 5 mg via INTRAVENOUS

## 2014-06-24 MED ORDER — ALTEPLASE 2 MG IJ SOLR: 2.0000 mg | Freq: Once | INTRAMUSCULAR | Status: DC | PRN

## 2014-06-24 MED ORDER — SODIUM CHLORIDE 0.9 % IV SOLN: 100.0000 mL | INTRAVENOUS | Status: DC | PRN

## 2014-06-24 MED ORDER — SODIUM CHLORIDE 0.9 % IV SOLN
125.0000 mg | INTRAVENOUS | Status: DC
  Administered 2014-06-24 – 2014-07-10 (×7): 125 mg via INTRAVENOUS
  Filled 2014-06-24 (×14): qty 10

## 2014-06-24 MED ORDER — LIDOCAINE-PRILOCAINE 2.5-2.5 % EX CREA: 1.0000 "application " | TOPICAL_CREAM | CUTANEOUS | Status: DC | PRN

## 2014-06-24 MED ORDER — LIDOCAINE HCL (PF) 1 % IJ SOLN: 5.0000 mL | INTRAMUSCULAR | Status: DC | PRN

## 2014-06-24 MED ORDER — AMLODIPINE BESYLATE 10 MG PO TABS
10.0000 mg | ORAL_TABLET | Freq: Every day | ORAL | Status: DC
  Administered 2014-06-24 – 2014-06-28 (×5): 10 mg via ORAL
  Filled 2014-06-24 (×5): qty 1

## 2014-06-24 MED ORDER — LORAZEPAM 0.5 MG PO TABS
1.0000 mg | ORAL_TABLET | Freq: Once | ORAL | Status: AC
  Administered 2014-06-24: 1 mg via ORAL
  Filled 2014-06-24: qty 2

## 2014-06-24 MED ORDER — HEPARIN SODIUM (PORCINE) 1000 UNIT/ML DIALYSIS: 1000.0000 [IU] | INTRAMUSCULAR | Status: DC | PRN

## 2014-06-24 MED ORDER — METOPROLOL TARTRATE 25 MG PO TABS
25.0000 mg | ORAL_TABLET | Freq: Two times a day (BID) | ORAL | Status: DC
  Administered 2014-06-24 – 2014-06-30 (×11): 25 mg via ORAL
  Filled 2014-06-24 (×14): qty 1

## 2014-06-24 MED ORDER — NEPRO/CARBSTEADY PO LIQD: 237.0000 mL | ORAL | Status: DC | PRN

## 2014-06-24 MED ORDER — HEPARIN SODIUM (PORCINE) 1000 UNIT/ML DIALYSIS: 2000.0000 [IU] | INTRAMUSCULAR | Status: DC | PRN

## 2014-06-24 MED ORDER — ATORVASTATIN CALCIUM 10 MG PO TABS
10.0000 mg | ORAL_TABLET | Freq: Every day | ORAL | Status: DC
  Administered 2014-06-24 – 2014-06-30 (×7): 10 mg via ORAL
  Filled 2014-06-24 (×8): qty 1

## 2014-06-24 NOTE — Progress Notes (Signed)
Speech Language Pathology Treatment: Dysphagia  Patient Details Name: [name] MRN: [id number] DOB: 06/10/1955 Today's Date: 06/24/2014 Time: 1000-1007 SLP Time Calculation (min) (ACUTE ONLY): 7 min  Assessment / Plan / Recommendation Clinical Impression  Pt demonstrates normal swallow function, may continue with regular diet and thin liquids without SLP f/u. Will sign off.    HPI HPI: Pt is a 59 y/o female with hx of asthma, CKD and hypertension [facility] ED on 3/16 complaining of shortness of breath and generalized weakness x 2 weeks. Worse with exhertion and chest tightness during these times.She noted not taking her diuretic medications for majority of that time period.RLE edema was "bad" but has now resolved. She denied fever but noted some mild chills. In the ED she was noted to be hypoxemic and in what is believed to be acute on chronic renal failure. MRi revealed bilateral infarcts involving both hemispheres with the largest congluent area involving the RIGHT medial parasagittal frontal and posterior frontal cortex, distal ACA territoty. CXR revealed no pleural effusion or pneumothorax; clear lungs.    Pertinent Vitals Pain Assessment: No/denies pain  SLP Plan       Recommendations Diet recommendations: Regular;Thin liquid Liquids provided via: Cup;Straw Medication Administration: Whole meds with liquid Supervision: Patient able to self feed Postural Changes and/or Swallow Maneuvers: Seated upright 90 degrees                   GO     Jaela Yepez, Riley NearingBonnie Caroline 06/24/2014, 10:15 AM

## 2014-06-24 NOTE — Progress Notes (Signed)
  [facility] Progress Note   Subjective: no new issues  Filed Vitals:   06/24/14 0828 06/24/14 1233 06/24/14 1317 06/24/14 1325  BP: 151/98 155/122 155/111 149/109  Pulse: 88  101 101  Temp: 97.4 F (36.3 C) 97.6 F (36.4 C) 98 F (36.7 C)   TempSrc: Oral Oral Oral   Resp: 23  24   Height:      Weight:   99.8 kg (220 lb 0.3 oz)   SpO2: 96%  89%    Exam: Alert, calm, weak No jvd Chest clear bilat RRR tachy no MRG Abd soft, NTND, +BS No LE edema L arm AVF patent Neuro left body hemiparesis  HD: new start to HD       Assessment: 1. ESRD , new start - HD initiated on 3/17 2. CVA - acute, ACA territory, left hemiparesis, dec'd affect 3. Anemia no ESA, Hb ok 4. MBD phoslo 5. COPD still smoking 6. R HF - by TEE, severe 7. Flu / Influenza A / H1N1 - resolved clinically 8. HD access - had fistulogram w focal aneursymal dilatation, no intervention 9. Dispo - CIR to assess  Plan - plan HD today.      Vinson Moselleob Rael Yo MD  pager [phone]    cell [phone]  06/24/2014, 1:50 PM     Recent Labs Lab 06/18/14 0405 06/19/14 0232 06/20/14 0850 06/22/14 0405  NA 131* 134* 134* 138  K 5.4* 4.3 3.8 3.7  CL 103 97 95* 98  CO2 15* 22 25 26   GLUCOSE 111* 218* 125* 135*  BUN 73* 60* 45* 64*  CREATININE 5.86* 6.12* 5.77* 5.63*  CALCIUM 7.8* 7.0* 7.4* 8.8  PHOS 7.8*  --  6.5*  --     Recent Labs Lab 06/18/14 0405 06/19/14 0232 06/20/14 0850  AST  --  246* 89*  ALT  --  209* 148*  ALKPHOS  --  156* 151*  BILITOT  --  0.7 0.8  PROT  --  6.3 6.4  ALBUMIN 2.9* 2.4* 2.5*    Recent Labs Lab 06/17/14 1707  06/19/14 0232 06/20/14 0850 06/22/14 0405  WBC 7.0  < > 8.5 13.9* 9.7  NEUTROABS 4.2  --   --   --   --   HGB 12.4  < > 11.8* 12.3 12.5  HCT 39.7  < > 37.7 38.2 39.0  MCV 83.6  < > 81.6 80.4 79.8  PLT 305  < > 298 289 319  < > = values in this interval not displayed. Marland Kitchen. amLODipine  10 mg Oral Daily  . antiseptic oral rinse  7 mL Mouth Rinse BID   . aspirin  325 mg Oral Daily  . atorvastatin  10 mg Oral q1800  . calcium acetate  667 mg Oral TID WC  . citalopram  20 mg Oral Daily  . fenofibrate  160 mg Oral Daily  . ferric gluconate (FERRLECIT/NULECIT) IV  125 mg Intravenous Q M,W,F-HD  . heparin subcutaneous  5,000 Units Subcutaneous 3 times per day  . ipratropium-albuterol  3 mL Nebulization TID  . metoprolol tartrate  25 mg Oral BID  . multivitamin  1 tablet Oral QHS  . predniSONE  40 mg Oral BID WC     sodium chloride, sodium chloride, acetaminophen, albuterol, allopurinol, alteplase, feeding supplement (NEPRO CARB STEADY), heparin, [START ON 06/25/2014] heparin, hydrOXYzine, lidocaine (PF), lidocaine-prilocaine, metoprolol, ondansetron (ZOFRAN) IV, oxyCODONE, pentafluoroprop-tetrafluoroeth

## 2014-06-24 NOTE — Evaluation (Signed)
Occupational Therapy Evaluation Patient Details Name: [name] MRN: [id number] DOB: Jul 10, 1955 Today's Date: 06/24/2014    History of Present Illness [name] is a 59 y.o. female with a history of CKD and hypertension came to the [facility] ED on 3/16 complaining of shortness of breath and generalized weakness x 2 weeks. Admitted with acute hypoxemic respiratory failure. During hospital stay noted to have weakness of her left side. A head CT was completed, imaging reviewed, it shows an acute infarct in the right anterior cerebral artery territory   Clinical Impression   Pt was independent prior to admission.  Presents with dense L sided weakness, decreased sensation, some L inattention, and decreased balance.  Pt requires +2 assist for all mobility and min to total assist for ADL.  Pt instructed in positioning L UE for protection and to decrease edema.  Pt will need intensive rehab. Will follow acutely.   Follow Up Recommendations  CIR    Equipment Recommendations  Other (comment) (TBD in next venue)    Recommendations for Other Services       Precautions / Restrictions Precautions Precautions: Fall Restrictions Weight Bearing Restrictions: No      Mobility Bed Mobility Overal bed mobility: Needs Assistance;+2 for physical assistance Bed Mobility: Rolling;Sidelying to Sit Rolling: Mod assist Sidelying to sit: Mod assist       General bed mobility comments: Patient initiated movement utilizing strong RUE to pull to sit, patient then able to initiate RLE movement but required assist for hemiparethic left side, moderate assist to elevate trunk to upright with multimodal cues for recognition of midline  Transfers Overall transfer level: Needs assistance Equipment used: 2 person hand held assist (face to face with chuck pad) Transfers: Sit to/from Stand Sit to Stand: Max assist;+2 physical assistance;From elevated surface Stand pivot transfers: Max assist;+2 physical  assistance;From elevated surface       General transfer comment: Faciliatation techniques for initiation of standing on left side, LLE blocked to prevent buckling, patient assisted with power up through RLE. performed sit <> stand x3. No ability to advance LLE during pivot to chair'    Balance   Sitting-balance support: Feet supported Sitting balance-Leahy Scale: Poor Sitting balance - Comments: patient with some static improvements in sitting balance, able to maintain sitting without assist but continued to required Verbal cues to correct to midline. Patient able to self correct once cued.  Postural control: Left lateral lean Standing balance support: During functional activity Standing balance-Leahy Scale: Zero                              ADL Overall ADL's : Needs assistance/impaired Eating/Feeding: Sitting;Minimal assistance Eating/Feeding Details (indicate cue type and reason): assist to open containers and cut food Grooming: Wash/dry face;Minimal assistance;Sitting   Upper Body Bathing: Maximal assistance;Sitting   Lower Body Bathing: Total assistance;Bed level   Upper Body Dressing : Maximal assistance;Sitting   Lower Body Dressing: Total assistance;Bed level                       Vision Additional Comments: L inattention   Perception     Praxis      Pertinent Vitals/Pain Pain Assessment: No/denies pain     Hand Dominance Right   Extremity/Trunk Assessment Upper Extremity Assessment Upper Extremity Assessment: LUE deficits/detail LUE Deficits / Details: minimal shoulder elevation and scapular retraction,flaccid otherwise, mild edema LUE Sensation: decreased proprioception (states her arm  feels like someone else's) LUE Coordination: decreased fine motor;decreased gross motor   Lower Extremity Assessment Lower Extremity Assessment: Defer to PT evaluation       Communication Communication Communication: No difficulties   Cognition  Arousal/Alertness: Awake/alert Behavior During Therapy: WFL for tasks assessed/performed Overall Cognitive Status: Within Functional Limits for tasks assessed                     General Comments       Exercises       Shoulder Instructions      Home Living Family/patient expects to be discharged to:: Inpatient rehab Living Arrangements: Children (daughter) Available Help at Discharge: Family Type of Home: Apartment Home Access: Stairs to enter Secretary/administratorntrance Stairs-Number of Steps: 12   Home Layout: One level               Home Equipment: None   Additional Comments: tub shower, standard height toilets      Prior Functioning/Environment Level of Independence: Independent             OT Diagnosis: Generalized weakness;Hemiplegia non-dominant side   OT Problem List: Decreased strength;Decreased activity tolerance;Impaired balance (sitting and/or standing);Impaired vision/perception;Decreased coordination;Decreased knowledge of use of DME or AE;Cardiopulmonary status limiting activity;Obesity;Impaired tone;Impaired sensation;Impaired UE functional use;Increased edema   OT Treatment/Interventions: Self-care/ADL training;Neuromuscular education;Therapeutic activities;Patient/family education;Balance training;Visual/perceptual remediation/compensation;DME and/or AE instruction    OT Goals(Current goals can be found in the care plan section) Acute Rehab OT Goals Patient Stated Goal: to get back to how she was OT Goal Formulation: With patient Time For Goal Achievement: 07/08/14 Potential to Achieve Goals: Good ADL Goals Pt Will Perform Grooming: with set-up;sitting Pt Will Perform Upper Body Bathing: with min assist;sitting Pt Will Perform Upper Body Dressing: with min assist;sitting Pt Will Transfer to Toilet: with mod assist;stand pivot transfer;bedside commode Additional ADL Goal #1: Pt will sit EOB with supervision x 10 minutes while engaged in ADL. Additional  ADL Goal #2: Pt will protect L UE during bed mobility and position it in sitting and supine on pillows to reduce injury and edema with minimal verbal cues.  OT Frequency: Min 3X/week   Barriers to D/C:            Co-evaluation PT/OT/SLP Co-Evaluation/Treatment: Yes Reason for Co-Treatment: For patient/therapist safety   OT goals addressed during session: ADL's and self-care      End of Session Equipment Utilized During Treatment: Oxygen;Gait belt Nurse Communication: Mobility status  Activity Tolerance: Patient tolerated treatment well Patient left: in chair;with call bell/phone within reach   Time: [id number] OT Time Calculation (min): 31 min Charges:  OT General Charges $OT Visit: 1 Procedure OT Evaluation $Initial OT Evaluation Tier I: 1 Procedure G-Codes:    [name], Haila Dena Lynn 06/24/2014, 10:16 AM  [phone]

## 2014-06-24 NOTE — Consult Note (Signed)
Physical Medicine and Rehabilitation Consult   Reason for Consult: Left sided weakness Referring Physician: Dr. Catha Gosselin   HPI: [name] is a 59 y.o. female with a history of CKD IV, asthma, obesity, HTN who  Presented to [facility] ED on 3/16 with complaints of SOB, chest tightness and generalized weakness x 2 weeks. She was admitted with acute hypoxemic respiratory failure due to fluid overload and influenza pneumonitis--H1N1 positive.  Patient had stopped her diuretics 2 weeks PTA and was noted to have BLE edema, Cr-5.47  And K+5.5.  Dr. Briant Cedar was consulted for input and recommended IV lasix for diuresis. Renal ultrasound with echogenic right kidney c/w medical renal disease and 1.9 cm hypoechoic mass in left kidney.  Patient had poor UOP requiring HD and has progressed to ESRD but felt to be a poor candidate for continuing dialysis.  Influenza treated with Tamiflu and patient is showing improvement in respiratory status.   She was noted to have weakness of her left side and head CT was completed, MRI/MRA brain done revealing "multifocal areas of acute infarction throughout both hemispheres,with the largest confluent area involving the RIGHT medial parasagittal frontal and posterior frontal cortex, distal ACA territory and no large vessel occlusion."  Neurology consulted for work up and recommended TEE due to concerns of cardioembolic stroke as well as ASA for secondary stroke prevention. Carotid dopplers without significant ICA stenosis. TEE revealed EF 55-60%, severe RV dysfunction, severe RAE, severe TR and positive saline agitation test c/w PFO. Patient noted to have frequent atrial tachycardia during the procedure with HR 150-160. She has had issues with malaise as well as anxiety. Therapy evaluations done yesterday and CIR recommended by MD and PT for follow up therapy.   Discussed patient with Dr. Arlean Hopping, Question as to whether the patient will be able to be transported to outpatient  dialysis when she leaves the hospital.  Required IV Lopressor this morning for tachycardia Review of Systems  Constitutional: Positive for malaise/fatigue (good days and bad days PTA).  Respiratory: Positive for cough and shortness of breath (with activity).   Cardiovascular: Positive for chest pain (with activity) and palpitations.  Gastrointestinal: Negative for heartburn and abdominal pain.  Musculoskeletal: Positive for back pain.  Neurological: Positive for focal weakness. Negative for dizziness and headaches.      Past Medical History  Diagnosis Date  . Asthma   . Hypertension   . Gout   . Arthritis   . Insomnia   . Chronic kidney disease   . Depression     Past Surgical History  Procedure Laterality Date  . Multiple tooth extractions    . Av fistula placement Left 01/28/2014    Procedure: ARTERIOVENOUS (AV) FISTULA CREATION;  Surgeon: Sherren Kerns, MD;  Location: [facility] OR;  Service: Vascular;  Laterality: Left;  . Tee without cardioversion N/A 06/23/2014    Procedure: TRANSESOPHAGEAL ECHOCARDIOGRAM (TEE);  Surgeon: Lewayne Bunting, MD;  Location: [facility] ENDOSCOPY;  Service: Cardiovascular;  Laterality: N/A;    Family History  Problem Relation Age of Onset  . Diabetes Mother   . Hypertension Mother   . Heart disease Mother   . Heart attack Mother   . Peripheral vascular disease Mother   . Diabetes Father   . Heart disease Father   . Hypertension Father   . Diabetes Sister   . Hypertension Sister   . Heart disease Sister     before age 46  . Heart attack Sister   .  Peripheral vascular disease Sister   . Heart disease Brother   . Hyperlipidemia Daughter     Social History: Lives with daughter. Used to work in food industry--disabled for the past 3 years. she reports that she has been smoking Cigarettes--unable to state.  She has a 10 pack-year smoking history. She has never used smokeless tobacco. She reports that she drinks alcohol--"not so much" She reports  that she does not use illicit drugs.    Allergies: No Known Allergies    Medications Prior to Admission  Medication Sig Dispense Refill  . allopurinol (ZYLOPRIM) 100 MG tablet Take 100 mg by mouth 2 (two) times daily as needed (for gout).    . citalopram (CELEXA) 20 MG tablet Take 20 mg by mouth daily.    . furosemide (LASIX) 80 MG tablet Take 80 mg by mouth daily.    . hydrOXYzine (ATARAX/VISTARIL) 25 MG tablet Take 25 mg by mouth 3 (three) times daily as needed for anxiety.    Marland Kitchen. oxyCODONE (ROXICODONE) 5 MG immediate release tablet Take 1 tablet (5 mg total) by mouth every 6 (six) hours as needed for severe pain. (Patient not taking: Reported on 06/17/2014) 15 tablet 0    Home: Home Living Family/patient expects to be discharged to:: Inpatient rehab Living Arrangements: Children (daughter) Available Help at Discharge: Family Type of Home: Apartment Home Access: Stairs to enter Secretary/administratorntrance Stairs-Number of Steps: 12 Home Layout: One level Home Equipment: None  Functional History: Prior Function Level of Independence: Independent Functional Status:  Mobility: Bed Mobility Overal bed mobility: Needs Assistance, +2 for physical assistance Bed Mobility: Rolling, Supine to Sit, Sit to Supine Rolling: Mod assist Supine to sit: Max assist Sit to supine: Max assist General bed mobility comments: Patient with poor functional use of left side, assist for positioning and sequencing, assist to power up to EOB, patient able to initiate movement and demonstrates good determination to carry out tasks Transfers Overall transfer level: Needs assistance Equipment used: 2 person hand held assist Transfers: Sit to/from Stand, Stand Pivot Transfers Sit to Stand: Max assist, +2 physical assistance, From elevated surface Stand pivot transfers: Max assist, +2 physical assistance, From elevated surface General transfer comment: Patient able to power up from elevate surface with assist, initiating  movement with right side, poor control and function through left side, LLE required Blocking during all transitional movements.       ADL:    Cognition: Cognition Overall Cognitive Status: No family/caregiver present to determine baseline cognitive functioning Orientation Level: Oriented X4 Cognition Arousal/Alertness: Awake/alert Behavior During Therapy: WFL for tasks assessed/performed Overall Cognitive Status: No family/caregiver present to determine baseline cognitive functioning  Blood pressure 151/98, pulse 88, temperature 97.4 F (36.3 C), temperature source Oral, resp. rate 23, height 5\' 6"  (1.676 m), weight 100.3 kg (221 lb 1.9 oz), SpO2 96 %. Physical Exam  Constitutional: She is oriented to person, place, and time. She appears well-developed and well-nourished. Nasal cannula in place.  HENT:  Head: Normocephalic and atraumatic.  Eyes: Pupils are equal, round, and reactive to light. Right conjunctiva is injected. Left conjunctiva is injected.  Neck: Normal range of motion. Neck supple.  Cardiovascular: An irregular rhythm present. Tachycardia present.   Heart rate up to 150 with MMT.  Respiratory: Effort normal. She has rhonchi.  GI: Soft. Bowel sounds are normal. She exhibits no distension. There is no tenderness.  Musculoskeletal: She exhibits edema (1-2+ LUE).  Neurological: She is alert and oriented to person, place, and time.  Flat  affect. Delayed responses and easily distracted needing redirection. Able to follow one and two step commands. Left hemiparesis-LE>UE.  Skin: Skin is warm and dry.  Psychiatric: Her affect is blunt. Her speech is delayed. She is slowed.  Decreased initiation. She has decreased sensation in the left lower extremity to proprioception as well as to touch and pain. Mildly reduced sensation in left upper extremity Normal sensation in the right upper and right lower extremity 4/5 strength in the right deltoid, biceps, triceps, grip, hip flexor,  knee extensor, ankle dorsiflexor Trace finger extension and finger flexion in the left upper extremity otherwise 0 in the left upper extremity. 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  EXAM: DIALYSIS AV FISTULOGRAM  COMPARISON:  None.  CONTRAST:  50mL OMNIPAQUE IOHEXOL 300 MG/ML  SOLN  FLUOROSCOPY TIME:  24 seconds.  PROCEDURE: The procedure, risks, benefits, and alternatives were explained to the patient. Questions regarding the procedure were encouraged and answered. The patient understands and consents to the procedure.  The left arm native fistula was prepped with Betadine in a sterile fashion, and a sterile drape was applied covering the operative field. A diagnostic fistulogram was performed via an 18 gauge angiocatheter introduced into venous outflow. Venous drainage was assessed to the level of the central veins in the chest. Proximal fistula was studied by reflux maneuver with temporary compression of venous outflow. After the procedure, the angiocatheter was removed and hemostasis obtained with manual compression.  COMPLICATIONS: None  FINDINGS: Antecubital anastomosis between the brachial artery and cephalic vein is normally patent. After a short segment, there is focal aneurysmal dilatation of the vein in the antecubital fossa. There is competing venous outflow noted in the cephalic vein as well as the deep venous system. The  cephalic vein is tortuous but shows no significant focal stenoses. Central veins are normally patent, including the SVC.  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 There is competing venous outflow in the deep venous system of the upper arm.  ACCESS: No percutaneous intervention was indicated today.   Electronically Signed   By: Irish Lack M.D.   On: 06/22/2014 13:20    Assessment/Plan: Diagnosis: Right ACA infarct with left hemiparesis as well as cognitive deficits resulting in decreased attention and initiation 1. Does the need for close, 24 hr/day medical supervision in concert with the patient's rehab needs make it unreasonable for this patient to be served in a less intensive setting? Yes 2. 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 Due to bladder management, bowel management, safety, skin/wound care, disease management, medication administration and patient education, does the patient require 24 hr/day rehab nursing? Yes 4. Does the patient require coordinated care of  a physician, rehab nurse, PT (1-2 hrs/day, 5 days/week), OT (1-2 hrs/day, 5 days/week) and SLP (0.5-1 hrs/day, 5 days/week) to address physical and functional deficits in the context of the above medical diagnosis(es)? Yes Addressing deficits in the following areas: balance, endurance, locomotion, strength, transferring, bowel/bladder control, bathing, dressing, feeding, grooming, toileting, cognition, speech and swallowing 5. Can the patient actively participate in an intensive therapy program of at least 3 hrs of therapy per day at least 5 days per week? not currently, needs heart rate controlled on oral medications 6. The potential for patient to make measurable gains while on inpatient  rehab is good 7. Anticipated functional outcomes upon discharge from inpatient rehab are min assist and mod assist  with PT, min assist and mod assist with OT, supervision with SLP. 8. Estimated rehab length of stay to reach the above functional goals is: 18-22 days 9. Does the patient have adequate social supports and living environment to accommodate these discharge functional goals? Potentially 10. Anticipated D/C setting: Home 11. Anticipated post D/C treatments: HH therapy 12. Overall Rehab/Functional Prognosis: good  RECOMMENDATIONS: This patient's condition is appropriate for continued rehabilitative care in the following setting: CIR once medically stable, needs heart rate control on oral medications Patient has agreed to participate in recommended program. Potentially Note that insurance prior authorization may be required for reimbursement for recommended care.  Comment: Cardiology to address tachycardia as well as tricuspid regurgitation    06/24/2014

## 2014-06-24 NOTE — Progress Notes (Signed)
Patient on dialysis, intermittently jumping to rapid SVT 130 -150 bpm which last 5-10 sec or less. No symptoms, BP stable, on a 4K bath already.  Check Mg, will give IV MTP 5mg  and see is this helps. Suspect atrial flutter.  Will notify primary MD. If heart rate doesn't improve will need to stop HD.    Stephanie Moselleob Stephanie Cid MD (pgr) [phone]    [phone] 06/24/2014, 2:30 PM

## 2014-06-24 NOTE — Progress Notes (Signed)
Hemodialysis= Pt tachycardic during HD. Per primary RN this happened this am as well. Dr. Arlean HoppingSchertz at bedside. Metoprolol 5mg  IV given for HR. Strips printed and placed in chart. HR not sustained. Continue to monitor patient.

## 2014-06-24 NOTE — Progress Notes (Signed)
Physical Therapy Treatment Patient Details Name: [name] MRN: [id number] DOB: 06-Sep-1955 Today's Date: 06/24/2014    History of Present Illness [name] is a 59 y.o. female with a history of CKD and hypertension [facility] on 3/16 complaining of shortness of breath and generalized weakness x 2 weeks. Admitted with acute hypoxemic respiratory failure. During hospital stay noted to have weakness of her left side. A head CT was completed, imaging reviewed, it shows an acute infarct in the right anterior cerebral artery territory    PT Comments    Patient pleasant and motivated this am. Tolerated extensive trunk control work EOB as well as pre gait activities with 2 person assist. Patient continues to demonstrate significant deficits WU:[id number]:hemiplegia left side, impacting ability to carryout functional tasks. Patient educated re positioning for edema control as well as trunk positioning. Patient very receptive. Will continue to see and progress as tolerated. Continue to highly recommend CIR upon acute discharge.   Follow Up Recommendations  CIR     Equipment Recommendations   (TBD)    Recommendations for Other Services Rehab consult;OT consult     Precautions / Restrictions Precautions Precautions: Fall Restrictions Weight Bearing Restrictions: No    Mobility  Bed Mobility Overal bed mobility: Needs Assistance;+2 for physical assistance Bed Mobility: Rolling;Sidelying to Sit Rolling: Mod assist Sidelying to sit: Mod assist       General bed mobility comments: Patient initiated movement utilizing strong RUE to pull to sit, patient then able to initiate RLE movement but required assist for hemiparethic left side, moderate assist to elevate trunk to upright with multimodal cues for recognition of midline  Transfers Overall transfer level: Needs assistance Equipment used: 2 person hand held assist (face to face with chuck pad) Transfers: Sit to/from Stand Sit to  Stand: Max assist;+2 physical assistance;From elevated surface Stand pivot transfers: Max assist;+2 physical assistance;From elevated surface       General transfer comment: Faciliatation techniques for initiation of standing on left side, LLE blocked to prevent buckling, patient assisted with power up through RLE. performed sit <> stand x3. No ability to advance LLE during pivot to chair'  Ambulation/Gait                 Stairs            Wheelchair Mobility    Modified Rankin (Stroke Patients Only)       Balance   Sitting-balance support: Feet supported Sitting balance-Leahy Scale: Poor Sitting balance - Comments: patient with some static improvements in sitting balance, able to maintain sitting without assist but continued to required Verbal cues to correct to midline. Patient able to self correct once cued.  Postural control: Left lateral lean Standing balance support: During functional activity Standing balance-Leahy Scale: Zero                      Cognition Arousal/Alertness: Awake/alert Behavior During Therapy: WFL for tasks assessed/performed Overall Cognitive Status: Within Functional Limits for tasks assessed                      Exercises      General Comments General comments (skin integrity, edema, etc.): Performed EOB reaching activities with trunk facilitation. Trunk control activities performed EOB >10 minutes. Patient with improvements in core engagement.  Activities performed to faciliate midline recognition including placement on flexed elbow and push to upright x5.  (pre gait static standing with weight shifts  x3)      Pertinent Vitals/Pain Pain Assessment: No/denies pain    Home Living                      Prior Function            PT Goals (current goals can now be found in the care plan section) Acute Rehab PT Goals Patient Stated Goal: to get back to how she was PT Goal Formulation: With patient Time  For Goal Achievement: 07/07/14 Potential to Achieve Goals: Good Progress towards PT goals: Progressing toward goals    Frequency  Min 4X/week    PT Plan Current plan remains appropriate    Co-evaluation             End of Session Equipment Utilized During Treatment: Gait belt Activity Tolerance: Patient limited by fatigue Patient left: in chair;with call bell/phone within reach     Time: 1610-9604 PT Time Calculation (min) (ACUTE ONLY): 38 min  Charges:  $Therapeutic Activity: 23-37 mins                    G CodesFabio Asa Jul 23, 2014, 9:58 AM  Charlotte Crumb, PT DPT  [phone]

## 2014-06-24 NOTE — Consult Note (Signed)
Admit date: 06/17/2014 Referring Physician  Dr. Pearlean BrownieSethi Primary Physician August SaucerEAN, [name], MD Primary Cardiologist  New Reason for Consultation  ? atrial fibrillation  HPI: 59 year old female with end-stage renal disease on hemodialysis, hypertension, resolved acute hypoxemic respiratory failure secondary to the flu who was noted to have weakness on her left side during hospital stay with CT scan demonstrating acute infarct of right anterior cerebral artery territory. Transesophageal echocardiogram showed small PFO by bubble study.   Personally reviewed telemetry reveals short bursts of narrow complex tachycardia that is regular likely representative of supraventricular tachycardia, perhaps paroxysmal atrial tachycardia. I do not see any episodes indicative of atrial fibrillation. [name] does not recall ever being given the diagnosis of atrial fibrillation in the past. She currently denies any chest pain or significant shortness of breath.    PMH:   Past Medical History  Diagnosis Date  . Asthma   . Hypertension   . Gout   . Arthritis   . Insomnia   . Chronic kidney disease   . Depression     PSH:   Past Surgical History  Procedure Laterality Date  . Multiple tooth extractions    . Av fistula placement Left 01/28/2014    Procedure: ARTERIOVENOUS (AV) FISTULA CREATION;  Surgeon: Sherren Kernsharles E Fields, MD;  [facility];  Service: Vascular;  Laterality: Left;  . Tee without cardioversion N/A 06/23/2014    Procedure: TRANSESOPHAGEAL ECHOCARDIOGRAM (TEE);  Surgeon: Lewayne BuntingBrian S Crenshaw, MD;  [facility];  Service: Cardiovascular;  Laterality: N/A;   Allergies:  Review of patient's allergies indicates no known allergies. Prior to Admit Meds:   Prior to Admission medications   Medication Sig Start Date End Date Taking? Authorizing Provider  allopurinol (ZYLOPRIM) 100 MG tablet Take 100 mg by mouth 2 (two) times daily as needed (for gout).   Yes Historical Provider, MD    citalopram (CELEXA) 20 MG tablet Take 20 mg by mouth daily.   Yes Historical Provider, MD  furosemide (LASIX) 80 MG tablet Take 80 mg by mouth daily.   Yes Historical Provider, MD  hydrOXYzine (ATARAX/VISTARIL) 25 MG tablet Take 25 mg by mouth 3 (three) times daily as needed for anxiety.   Yes Historical Provider, MD  oxyCODONE (ROXICODONE) 5 MG immediate release tablet Take 1 tablet (5 mg total) by mouth every 6 (six) hours as needed for severe pain. Patient not taking: Reported on 06/17/2014 01/28/14   Raymond GurneyKimberly A Trinh, PA-C   Fam HX:    Family History  Problem Relation Age of Onset  . Diabetes Mother   . Hypertension Mother   . Heart disease Mother   . Heart attack Mother   . Peripheral vascular disease Mother   . Diabetes Father   . Heart disease Father   . Hypertension Father   . Diabetes Sister   . Hypertension Sister   . Heart disease Sister     before age 59  . Heart attack Sister   . Peripheral vascular disease Sister   . Heart disease Brother   . Hyperlipidemia Daughter    Social HX:    History   Social History  . Marital Status: Single    Spouse Name: N/A  . Number of Children: N/A  . Years of Education: N/A   Occupational History  . Not on file.   Social History Main Topics  . Smoking status: Current Every Day Smoker -- 0.50 packs/day for 20 years    Types: Cigarettes  . Smokeless  tobacco: Never Used  . Alcohol Use: Yes     Comment: occ  . Drug Use: No  . Sexual Activity: Not on file   Other Topics Concern  . Not on file   Social History Narrative     ROS:  All 11 ROS were addressed and are negative except what is stated in the HPI   Physical Exam: Blood pressure 151/98, pulse 88, temperature 97.4 F (36.3 C), temperature source Oral, resp. rate 23, height  (1.676 m), weight 221 lb 1.9 oz (100.3 kg), SpO2 96 %.   General: Well developed, well nourished, in no acute distress Head: Eyes PERRLA, No xanthomas.   Normal cephalic and  atramatic  Lungs:   Expiratory wheezes heard throughout lung fields. Heart:   HRRR S1 S2 Pulses are 2+ & equal. No murmur, rubs, gallops.  No carotid bruit. No JVD.  No abdominal bruits.  Abdomen: Bowel sounds are positive, abdomen soft and non-tender without masses. No hepatosplenomegaly. Msk:  Back normal. Normal strength and tone for age. Extremities:  No clubbing, cyanosis or edema.  DP +1 Neuro: Alert and oriented X 3 GU: Deferred Rectal: Deferred Psych:  Good affect, responds appropriately      Labs: Lab Results  Component Value Date   WBC 9.7 06/22/2014   HGB 12.5 06/22/2014   HCT 39.0 06/22/2014   MCV 79.8 06/22/2014   PLT 319 06/22/2014     Recent Labs Lab 06/20/14 0850 06/22/14 0405  NA 134* 138  K 3.8 3.7  CL 95* 98  CO2 25 26  BUN 45* 64*  CREATININE 5.77* 5.63*  CALCIUM 7.4* 8.8  PROT 6.4  --   BILITOT 0.8  --   ALKPHOS 151*  --   ALT 148*  --   AST 89*  --   GLUCOSE 125* 135*   No results for input(s): CKTOTAL, CKMB, TROPONINI in the last 72 hours. 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  EXAM: DIALYSIS AV FISTULOGRAM  COMPARISON:  None.  CONTRAST:  50mL OMNIPAQUE IOHEXOL 300 MG/ML  SOLN  FLUOROSCOPY TIME:  24 seconds.  PROCEDURE: The procedure, risks, benefits, and alternatives were explained to the patient. Questions regarding the procedure were encouraged and answered. The patient understands and consents to the procedure.  The left arm native fistula was prepped with Betadine in a sterile fashion, and a sterile drape was applied covering the operative field. A diagnostic fistulogram was performed via an 18 gauge angiocatheter introduced  into venous outflow. Venous drainage was assessed to the level of the central veins in the chest. Proximal fistula was studied by reflux maneuver with temporary compression of venous outflow. After the procedure, the angiocatheter was removed and hemostasis obtained with manual compression.  COMPLICATIONS: None  FINDINGS: Antecubital anastomosis between the brachial artery and cephalic vein is normally patent. After a short segment, there is focal aneurysmal dilatation of the vein in the antecubital fossa. There is competing venous outflow noted in the cephalic vein as well as the deep venous system. The cephalic vein is tortuous but shows no significant focal stenoses. Central veins are normally patent, including the SVC.  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 There is competing venous outflow in the deep venous system of the upper arm.  ACCESS: No percutaneous intervention was indicated today.   Electronically Signed   By: Irish Lack M.D.   On: 06/22/2014 13:20   Personally viewed.  EKG: 06/22/14 demonstrates sinus rhythm with frequent ectopic atrial beats, PACs/paroxysmal atrial tachycardia  Personally viewed.   Scheduled Meds: . amLODipine  10 mg Oral Daily  . antiseptic oral rinse  7 mL Mouth Rinse BID  . aspirin  325 mg Oral Daily  . atorvastatin  10 mg Oral q1800  . calcium acetate  667 mg Oral TID WC  . citalopram  20 mg Oral Daily  . fenofibrate  160 mg Oral Daily  . ferric gluconate (FERRLECIT/NULECIT) IV  125 mg Intravenous Q M,W,F-HD  . heparin subcutaneous  5,000 Units Subcutaneous 3 times per day  . ipratropium-albuterol  3 mL Nebulization TID  . multivitamin  1 tablet Oral QHS  . predniSONE  40 mg Oral BID WC   Continuous Infusions:  PRN Meds:.sodium chloride, sodium chloride, acetaminophen, albuterol, allopurinol, alteplase, feeding supplement (NEPRO CARB STEADY),  heparin, [START ON 06/25/2014] heparin, hydrOXYzine, lidocaine (PF), lidocaine-prilocaine, metoprolol, ondansetron (ZOFRAN) IV, oxyCODONE, pentafluoroprop-tetrafluoroeth   ASSESSMENT/PLAN:    59 year old female with acute stroke, flu, end-stage renal disease on hemodialysis, obesity, hypertension, hyperlipidemia with supraventricular tachycardia, paroxysmal consistent with paroxysmal atrial tachycardia.  1. PSVT-no evidence of atrial fibrillation. Continue with aspirin. -I will add metoprolol tartrate 25 mg twice a day to hopefully help suppress SVT. May need further up titration. Carefully monitor blood pressure. -Echocardiogram demonstrated normal ejection fraction. -Could consider decreasing amlodipine if blood pressure is unable to tolerate addition of beta blocker especially during hemodialysis.  2. Hyperlipidemia-continue with atorvastatin, consider higher dose given stroke. LDL goal less than 70.  3. Stroke-per neurology team. Transesophageal echocardiogram reviewed.  4. Obesity-encourage weight loss  5. Tobacco use-encouraged cessation  6. End-stage renal disease on hemodialysis-per nephrology    Donato Schultz, MD  06/24/2014  11:28 AM

## 2014-06-24 NOTE — Progress Notes (Signed)
[facility]                                                                              Patient Demographics  [name], is a 59 y.o. female, DOB - 16-May-1955, ZOX:[id number]  Admit date - 06/17/2014   Admitting Physician Lupita Leash, MD  Outpatient Primary MD for the patient is August Saucer, [name], MD  LOS - 7   Chief Complaint  Patient presents with  . Shortness of Breath  . Asthma  . Cough      HPI on Dr. Cyril Mourning (PCCM) on 06/17/2014 59 y/o female with CKD and hypertension came to the [facility] ED on 3/16 complaining of shortness of breath and generalized weakness x 2 weeks. Worse with exhertion, also described chest tightness during these times. She noted not taking her diuretic medications for majority of that time period. RLE edema was "bad" but has now resolved. She denied fever but noted some mild chills. In the ED she was noted to be hypoxemic and in what is believed to be acute on chronic renal failure. She was given lasix and renal and critical care medicine were consulted. Currently she feels much better and is breathing comfortably on 100% NRB.   Interim history Patient admitted to [facility] service and found to have uncompensated metabolic acidosis from worsening uremia, H1N1 flu. Renal ultrasound done noted mass off of left kidney. Patient transferred to [facility] for dialysis which greatly improved her breathing. On 3/18 patient noted to be hemiparetic on the left side and a CT scan noted an infarct of the right ACA and neurology consulted. Patient since stabilized and transferred to step down unit to hospitalist service. Patient's MRI notes focal areas of acute infarction throughout both hemispheres, consistent with an embolic phenomenon. Patient underwent stroke workup which had noted a negative TEE for clot, negative carotid Dopplers although she has had episodes of a flutter. She will likely need chronic long-term anticoagulation. Breathing continues to improve  and patient is down to approximately 3 L. She has continued to receive dialysis, followed by nephrology  Assessment & Plan   Acute respiratory failure with hypoxia -Patient continues to need nasal cannula, approximately 3 L -Chest x-ray -Likely multifactorial including influenza, chronic tobacco use with underlying COPD/volume overload  COPD with acute exacerbation -Possibly due to cor pulmonale -Continue steroids, taper down -Continue nebulizers, incentive spirometry  Acute CVA -MRI/MRA: Multifocal areas of acute infarction throughout both hemispheres, largest confluent area involving the right medial parasagittal frontal and posterior frontal cortex, distal ACA territory -TEE unrevealing: Normal LV function, Noel AA thrombus,severe RAE; mild RVE; severely reduced RV function; severe TR; bowing of atrial septum right to left; positive saline microcavitation study. -Carotid Doppler: Bilateral 1-39% ICA stenosis, vertebral artery flow is antegrade -Lower extremity Doppler showed no SVT or DVT in bilateral lower extremities, Doppler waveforms are pulsatile suggestive of fluid overload -Echocardiogram: EF 60-65%, grade 1 diastolic dysfunction, severely elevated pulmonary pressure -LDL 77, hemoglobin A1c 6.5 -Neurology consulted and appreciated -Hypercoagulable workup: negative lupus anticoagulant antiphospholipid antibody -Continue aspirin-full dose, statin -Possible need for loop recorder -PT and OT consulted and recommended CIR, CIR consulted and pending evaluation  Atrial flutter/paroxysmal SVT -  Cardiology consulted and appreciated -Continue continue metoprolol  Chronic diastolic heart failure -Echocardiogram: EF 6065%, grade 1 diastolic dysfunction -Continue to monitor intake, daily weights, hemodialysis  Hypertension -Continue amlodipine, metoprolol  End-stage renal disease on hemodialysis -Nephrology consulted and appreciated -Fistulogram conducted showing dilatation of the  cephalic vein beyond the arterial anastomosis  Left renal mass -Found on renal ultrasound 06/18/2014, intermediate 1.9 cm hypoechoic mass of the interpolar region of the left kidney -Patient will need renal MRI  Influenza, H1N1 -Patient completed course of Tamiflu 06/22/2014  Hyperlipidemia -Lipid profile: TC 156, TG 294, HDL 20, LDL 77 -Continue statin and fenofibrate, will monitor for signs of myalgia  Tobacco abuse -Smoking cessation counseling given  Depression -Continue Celexa  Obesity -BMI greater than 30 -Patient with primary care physician regarding lifestyle modifications upon discharge  Code Status: Full  Family Communication: None at bedside  Disposition Plan: Admitted, patient continues to have tachycardia, will consult cardiology  Time Spent in minutes   30 minutes  Procedures  Echocardiogram TEE Carotid Doppler Renal ultrasound Lower extremity Doppler  Consults   PCCM Cardiology Neurology Nephrology Inpatient rehab  DVT Prophylaxis  heparin  Lab Results  Component Value Date   PLT 319 06/22/2014    Medications  Scheduled Meds: . amLODipine  10 mg Oral Daily  . antiseptic oral rinse  7 mL Mouth Rinse BID  . aspirin  325 mg Oral Daily  . atorvastatin  10 mg Oral q1800  . calcium acetate  667 mg Oral TID WC  . citalopram  20 mg Oral Daily  . fenofibrate  160 mg Oral Daily  . ferric gluconate (FERRLECIT/NULECIT) IV  125 mg Intravenous Q M,W,F-HD  . heparin subcutaneous  5,000 Units Subcutaneous 3 times per day  . ipratropium-albuterol  3 mL Nebulization TID  . metoprolol tartrate  25 mg Oral BID  . multivitamin  1 tablet Oral QHS  . predniSONE  40 mg Oral BID WC   Continuous Infusions:  PRN Meds:.sodium chloride, sodium chloride, acetaminophen, albuterol, allopurinol, alteplase, feeding supplement (NEPRO CARB STEADY), heparin, [START ON 06/25/2014] heparin, hydrOXYzine, lidocaine (PF), lidocaine-prilocaine, metoprolol, ondansetron  (ZOFRAN) IV, oxyCODONE, pentafluoroprop-tetrafluoroeth  Antibiotics    Anti-infectives    Start     Dose/Rate Route Frequency Ordered Stop   06/19/14 1800  oseltamivir (TAMIFLU) capsule 30 mg     30 mg Oral Every M-W-F (1800) 06/19/14 1003 06/22/14 1841   06/18/14 1600  oseltamivir (TAMIFLU) capsule 30 mg  Status:  Discontinued     30 mg Oral Daily 06/18/14 1538 06/19/14 1003        Subjective:   Lawonda Morello seen and examined today.  Patient denies any pain at this time. States her breathing has improved. Denies chest pain, abdominal pain, nausea, vomiting, diarrhea.   Objective:   Filed Vitals:   06/24/14 0428 06/24/14 0500 06/24/14 0828 06/24/14 1233  BP: 159/109  151/98 155/122  Pulse:   88   Temp: 98.1 F (36.7 C)  97.4 F (36.3 C) 97.6 F (36.4 C)  TempSrc: Axillary  Oral Oral  Resp:   23   Height:      Weight:  100.3 kg (221 lb 1.9 oz)    SpO2:   96%     Wt Readings from Last 3 Encounters:  06/24/14 100.3 kg (221 lb 1.9 oz)  06/01/14 102.059 kg (225 lb)  01/28/14 104.327 kg (230 lb)     Intake/Output Summary (Last 24 hours) at 06/24/14 1245 Last data filed at 06/23/14  1800  Gross per 24 hour  Intake    460 ml  Output    200 ml  Net    260 ml    Exam  General: Well developed, well nourished, NAD  HEENT: NCAT, mucous membranes moist.   Cardiovascular: S1 S2 auscultated, no murmur, regular rate and rhythm  Respiratory: Expiratory wheezing throughout all lung fields  Abdomen: Soft, obese, nontender, nondistended, + bowel sounds  Extremities: warm dry without cyanosis clubbing or edema  Neuro: AAOx3, mild left facial droop, Strength 1/5 LUE, LLE 0/5, RUE/RLE 5/5.    Data Review   Micro Results Recent Results (from the past 240 hour(s))  MRSA PCR Screening     Status: None   Collection Time: 06/17/14  9:29 PM  Result Value Ref Range Status   MRSA by PCR NEGATIVE NEGATIVE Final    Comment:        The GeneXpert MRSA Assay (FDA approved for  NASAL specimens only), is one component of a comprehensive MRSA colonization surveillance program. It is not intended to diagnose MRSA infection nor to guide or monitor treatment for MRSA infections.   Clostridium Difficile by PCR     Status: None   Collection Time: 06/18/14  3:52 AM  Result Value Ref Range Status   C difficile by pcr NEGATIVE NEGATIVE Final    Radiology Reports Dg Chest 2 View  06/17/2014   CLINICAL DATA:  Shortness of breath and chest tenderness for 1 week. Oxygen dependent.  EXAM: CHEST  2 VIEW  COMPARISON:  Chest radiograph 01/28/2014.  FINDINGS: Increasing cardiomegaly. No focal infiltrates or congestive failure. No effusion or pneumothorax. Bones unremarkable.  IMPRESSION: Increasing cardiomegaly.  No active infiltrates or failure.   Electronically Signed   By: Davonna BellingJohn  Curnes M.D.   On: 06/17/2014 17:18   Ct Head Wo Contrast  06/19/2014   CLINICAL DATA:  Left-sided weakness.  Dialysis patient.  EXAM: CT HEAD WITHOUT CONTRAST  TECHNIQUE: Contiguous axial images were obtained from the base of the skull through the vertex without intravenous contrast.  COMPARISON:  None  FINDINGS: Ill-defined hypodensity in the right medial frontal lobe consistent with acute infarct in the right anterior cerebral artery territory. No associated hemorrhage.  Negative for chronic ischemia. Ventricle size is normal. Negative for hemorrhage or mass lesion.  Calvarium intact.  Retention cyst in the left sphenoid sinus.  IMPRESSION: Acute infarct right anterior cerebral artery territory.  Critical Value/emergent results were called by telephone at the time of interpretation on 06/19/2014 at 12:53 pm to Dr. Billy FischerAVID SIMONDS , who verbally acknowledged these results.   Electronically Signed   By: Marlan Palauharles  Clark M.D.   On: 06/19/2014 12:53   Mr Maxine GlennMra Head Wo Contrast  06/20/2014   CLINICAL DATA:  Shortness of breath and generalized weakness for 2 weeks. LEFT hemiparesis and LEFT facial droop of recent  onset, but unspecified duration. History of chronic renal disease and hypertension. Acute respiratory failure. Abnormal CT head.  EXAM: MRI HEAD WITHOUT CONTRAST  MRA HEAD WITHOUT CONTRAST  TECHNIQUE: Multiplanar, multiecho pulse sequences of the brain and surrounding structures were obtained without intravenous contrast. Angiographic images of the head were obtained using MRA technique without contrast.  COMPARISON:  CT head performed 06/19/2014.  FINDINGS: MRI HEAD FINDINGS  Only a limited number of pulse sequences could be obtained on the MRI exam. Diffusion axial and T1 sagittal images are interpreted.  Multiple areas of restricted diffusion throughout both hemispheres are identified consistent with acute infarction. The largest  confluent area involves the RIGHT medial parasagittal frontal and posterior frontal cortex and subcortical white matter extending as far posteriorly as the central sulcus. Other smaller areas of restricted diffusion representing acute infarction can be seen in the LEFT anterior frontal cortex, LEFT parietal parasagittal cortex, and the LEFT parietal subcortical white matter.  No gross evidence for hemorrhage, mass lesion, hydrocephalus, or extra-axial fluid based on axial DWI. No pituitary enlargement or tonsillar herniation. Upper cervical region unremarkable. No osseous lesions.  MRA HEAD FINDINGS  Dolichoectatic but widely patent internal carotid arteries. Dolichoectatic and widely patent basilar artery. Both vertebrals contribute to basilar formation with the RIGHT dominant. There is no large vessel occlusion, proximal intracranial stenosis, or visible berry aneurysm. Moderately diseased anterior inferior cerebellar arteries bilaterally. No RIGHT PICA is visualized.  The distal RIGHT anterior cerebral artery in its pericallosal segment appears moderately diseased corresponding to the observed RIGHT ACA territory infarct.  IMPRESSION: Multifocal areas of acute infarction throughout  both hemispheres, with the largest confluent area involving the RIGHT medial parasagittal frontal and posterior frontal cortex, distal ACA territory.  A full MRI examination could not be completed, therefore comprehensive assessment of intracranial abnormalities, including characterization of the observed acute infarcts, is not possible.  No proximal large vessel occlusion on MRA intracranial exam.   Electronically Signed   By: Davonna Belling M.D.   On: 06/20/2014 17:11   Mr Brain Wo Contrast  06/20/2014   CLINICAL DATA:  Shortness of breath and generalized weakness for 2 weeks. LEFT hemiparesis and LEFT facial droop of recent onset, but unspecified duration. History of chronic renal disease and hypertension. Acute respiratory failure. Abnormal CT head.  EXAM: MRI HEAD WITHOUT CONTRAST  MRA HEAD WITHOUT CONTRAST  TECHNIQUE: Multiplanar, multiecho pulse sequences of the brain and surrounding structures were obtained without intravenous contrast. Angiographic images of the head were obtained using MRA technique without contrast.  COMPARISON:  CT head performed 06/19/2014.  FINDINGS: MRI HEAD FINDINGS  Only a limited number of pulse sequences could be obtained on the MRI exam. Diffusion axial and T1 sagittal images are interpreted.  Multiple areas of restricted diffusion throughout both hemispheres are identified consistent with acute infarction. The largest confluent area involves the RIGHT medial parasagittal frontal and posterior frontal cortex and subcortical white matter extending as far posteriorly as the central sulcus. Other smaller areas of restricted diffusion representing acute infarction can be seen in the LEFT anterior frontal cortex, LEFT parietal parasagittal cortex, and the LEFT parietal subcortical white matter.  No gross evidence for hemorrhage, mass lesion, hydrocephalus, or extra-axial fluid based on axial DWI. No pituitary enlargement or tonsillar herniation. Upper cervical region unremarkable. No  osseous lesions.  MRA HEAD FINDINGS  Dolichoectatic but widely patent internal carotid arteries. Dolichoectatic and widely patent basilar artery. Both vertebrals contribute to basilar formation with the RIGHT dominant. There is no large vessel occlusion, proximal intracranial stenosis, or visible berry aneurysm. Moderately diseased anterior inferior cerebellar arteries bilaterally. No RIGHT PICA is visualized.  The distal RIGHT anterior cerebral artery in its pericallosal segment appears moderately diseased corresponding to the observed RIGHT ACA territory infarct.  IMPRESSION: Multifocal areas of acute infarction throughout both hemispheres, with the largest confluent area involving the RIGHT medial parasagittal frontal and posterior frontal cortex, distal ACA territory.  A full MRI examination could not be completed, therefore comprehensive assessment of intracranial abnormalities, including characterization of the observed acute infarcts, is not possible.  No proximal large vessel occlusion on MRA intracranial exam.   Electronically  Signed   By: Davonna Belling M.D.   On: 06/20/2014 17:11   US Renal  06/18/2014   CLINICAL DATA:  Patient with acute renal failure.  EXAM: RENAL/URINARY TRACT ULTRASOUND COMPLETE  COMPARISON:  None.  FINDINGS: Right Kidney:  Length: 8.4 cm. No hydronephrosis. Renal cortex is diffusely increased in echogenicity.  Left Kidney:  Length: 9.1 cm. There is a 1.9 x 1.7 x 1.5 cm hypoechoic mass off of the interpolar region of the left kidney.  Bladder:  Poorly distended.  Ascites within the pelvis.  IMPRESSION: Indeterminate 1.9 cm hypoechoic mass off the interpolar region of the left kidney. Evaluation is markedly limited due to body habitus. This may potentially represent a cyst however solid mass is not excluded. Recommend follow-up in the outpatient setting with either pre and post enhanced CT or MRI.  Echogenic right kidney most compatible with chronic medical renal disease.  No  hydronephrosis.  Fluid within the pelvis.  These results will be called to the ordering clinician or representative by the Radiologist Assistant, and communication documented in the PACS or zVision Dashboard.   Electronically Signed   By: Annia Belt M.D.   On: 06/18/2014 13:20   Dg Chest Port 1 View  06/20/2014   CLINICAL DATA:  Hypoxia  EXAM: PORTABLE CHEST - 1 VIEW  COMPARISON:  06/17/2014  FINDINGS: Stable mild cardiomegaly. No mediastinal or hilar masses or evidence of adenopathy.  Clear lungs.  No pleural effusion or pneumothorax.  IMPRESSION: No acute cardiopulmonary disease. Stable appearance from the prior exam.   Electronically Signed   By: Amie Portland M.D.   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  EXAM: DIALYSIS AV FISTULOGRAM  COMPARISON:  None.  CONTRAST:  50mL OMNIPAQUE IOHEXOL 300 MG/ML  SOLN  FLUOROSCOPY TIME:  24 seconds.  PROCEDURE: The procedure, risks, benefits, and alternatives were explained to the patient. Questions regarding the procedure were encouraged and answered. The patient understands and consents to the procedure.  The left arm native fistula was prepped with Betadine in a sterile fashion, and a sterile drape was applied covering the operative field. A diagnostic fistulogram was performed via an 18 gauge angiocatheter introduced into venous outflow. Venous drainage was assessed to the level of the central veins in the chest. Proximal fistula was studied by reflux maneuver with temporary compression of venous outflow. After the procedure, the angiocatheter was removed and hemostasis obtained with manual compression.  COMPLICATIONS: None  FINDINGS: Antecubital anastomosis between the brachial artery and cephalic vein is normally patent. After a short segment, there is focal aneurysmal dilatation of the vein in the antecubital fossa. There is  competing venous outflow noted in the cephalic vein as well as the deep venous system. The cephalic vein is tortuous but shows no significant focal stenoses. Central veins are normally patent, including the SVC.  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 There is competing venous outflow in the deep venous system of the upper arm.  ACCESS: No percutaneous intervention was indicated today.   Electronically Signed   By: Irish Lack M.D.   On: 06/22/2014 13:20    CBC  Recent Labs Lab 06/17/14 1707 06/18/14 0240 06/19/14 0232 06/20/14 0850 06/22/14 0405  WBC 7.0 6.8 8.5 13.9* 9.7  HGB 12.4 12.6 11.8* 12.3 12.5  HCT 39.7 39.5 37.7 38.2 39.0  PLT 305 320 298 289 319  MCV 83.6 82.8 81.6 80.4 79.8  MCH 26.1 26.4 25.5* 25.9* 25.6*  MCHC 31.2 31.9 31.3 32.2 32.1  RDW 19.6* 19.5* 19.5* 19.0* 18.8*  LYMPHSABS 1.5  --   --   --   --   MONOABS 1.3*  --   --   --   --   EOSABS 0.0  --   --   --   --   BASOSABS 0.1  --   --   --   --     Chemistries   Recent Labs Lab 06/17/14 1707 06/18/14 0405 06/19/14 0232 06/20/14 0850 06/22/14 0405  NA 138 131* 134* 134* 138  K 5.5* 5.4* 4.3 3.8 3.7  CL 107 103 97 95* 98  CO2 18* 15* GLUCOSE 104* 111* 218* 125* 135*  BUN 64* 73* 60* 45* 64*  CREATININE 5.47* 5.86* 6.12* 5.77* 5.63*  CALCIUM 8.2* 7.8* 7.0* 7.4* 8.8  AST  --   --  246* 89*  --   ALT  --   --  209* 148*  --   ALKPHOS  --   --  156* 151*  --   BILITOT  --   --  0.7 0.8  --    ------------------------------------------------------------------------------------------------------------------ estimated creatinine clearance is 13 mL/min (by C-G formula based on Cr of 5.63). ------------------------------------------------------------------------------------------------------------------ No results for input(s): HGBA1C in the last 72  hours. ------------------------------------------------------------------------------------------------------------------ No results for input(s): CHOL, HDL, LDLCALC, TRIG, CHOLHDL, LDLDIRECT in the last 72 hours. ------------------------------------------------------------------------------------------------------------------ No results for input(s): TSH, T4TOTAL, T3FREE, THYROIDAB in the last 72 hours.  Invalid input(s): FREET3 ------------------------------------------------------------------------------------------------------------------ No results for input(s): VITAMINB12, FOLATE, FERRITIN, TIBC, IRON, RETICCTPCT in the last 72 hours.  Coagulation profile  Recent Labs Lab 06/17/14 2301  INR 1.27    No results for input(s): DDIMER in the last 72 hours.  Cardiac Enzymes  Recent Labs Lab 06/18/14 0240 06/18/14 0844 06/20/14 2037  TROPONINI 0.90* 0.69* 0.68*   ------------------------------------------------------------------------------------------------------------------ Invalid input(s): POCBNP    Lorice Lafave D.O. on 06/24/2014 at 12:45 PM  Between 7am to 7pm - Pager - [phone]  After 7pm go to [email or web address] - password [email or web address]  And look for the night coverage person covering for me after hours  [facility] Office  [phone]

## 2014-06-24 NOTE — Progress Notes (Addendum)
STROKE TEAM PROGRESS NOTE   HISTORY [name] is a 59 y.o. female with a history of CKD and hypertension came [facility] on 3/16 complaining of shortness of breath and generalized weakness x 2 weeks. Admitted with acute hypoxemic respiratory failure. During hospital stay noted to have weakness of her left side. 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   Patient was not administered TPA secondary to late presentation.   SUBJECTIVE (INTERVAL HISTORY) Her family is not at the bedside.  Overall she feels her condition is unchanged, TEE showed no intra-atrial clot but positive bubble study indicating of a small PFO.    OBJECTIVE Temp:  [97.4 F (36.3 C)-99.2 F (37.3 C)] 97.4 F (36.3 C) (03/23 0828) Pulse Rate:  [30-166] 88 (03/23 0828) Cardiac Rhythm:  [-] Sinus tachycardia (03/23 0823) Resp:  [14-26] 23 (03/23 0828) BP: (126-173)/(81-125) 151/98 mmHg (03/23 0828) SpO2:  [58 %-100 %] 96 % (03/23 0828) Weight:  [221 lb 1.9 oz (100.3 kg)] 221 lb 1.9 oz (100.3 kg) (03/23 0500)   Recent Labs Lab 06/22/14 2222 06/23/14 0825 06/23/14 1301 06/23/14 1659 06/24/14 0821  GLUCAP 143* 98 101* 121* 70    Recent Labs Lab 06/17/14 1707 06/18/14 0405 06/19/14 0232 06/20/14 0850 06/22/14 0405  NA 138 131* 134* 134* 138  K 5.5* 5.4* 4.3 3.8 3.7  CL 107 103 97 95* 98  CO2 18* 15* 22 25 26   GLUCOSE 104* 111* 218* 125* 135*  BUN 64* 73* 60* 45* 64*  CREATININE 5.47* 5.86* 6.12* 5.77* 5.63*  CALCIUM 8.2* 7.8* 7.0* 7.4* 8.8  PHOS  --  7.8*  --  6.5*  --     Recent Labs Lab 06/18/14 0405 06/19/14 0232 06/20/14 0850  AST  --  246* 89*  ALT  --  209* 148*  ALKPHOS  --  156* 151*  BILITOT  --  0.7 0.8  PROT  --  6.3 6.4  ALBUMIN 2.9* 2.4* 2.5*    Recent Labs Lab 06/17/14 1707 06/18/14 0240 06/19/14 0232 06/20/14 0850 06/22/14 0405  WBC 7.0 6.8 8.5 13.9* 9.7  NEUTROABS 4.2  --   --   --   --    HGB 12.4 12.6 11.8* 12.3 12.5  HCT 39.7 39.5 37.7 38.2 39.0  MCV 83.6 82.8 81.6 80.4 79.8  PLT 305 320 298 289 319    Recent Labs Lab 06/17/14 2052 06/18/14 0240 06/18/14 0844 06/20/14 2037  TROPONINI 1.13* 0.90* 0.69* 0.68*   No results for input(s): LABPROT, INR in the last 72 hours. No results for input(s): COLORURINE, LABSPEC, PHURINE, GLUCOSEU, HGBUR, BILIRUBINUR, KETONESUR, PROTEINUR, UROBILINOGEN, NITRITE, LEUKOCYTESUR in the last 72 hours.  Invalid input(s): APPERANCEUR     Component Value Date/Time   CHOL 156 06/20/2014 2037   TRIG 294* 06/20/2014 2037   HDL 20* 06/20/2014 2037   CHOLHDL 7.8 06/20/2014 2037   VLDL 59* 06/20/2014 2037   LDLCALC 77 06/20/2014 2037   Lab Results  Component Value Date   HGBA1C 6.5* 06/20/2014   No results found for: LABOPIA, COCAINSCRNUR, LABBENZ, AMPHETMU, THCU, LABBARB  No results for input(s): ETH in the last 168 hours.   I have personally reviewed the radiological images below and agree with the radiology interpretations.  Ct Head Wo Contrast 06/19/2014    Acute infarct right anterior cerebral artery territory.    [place] Renal 06/18/2014    Indeterminate 1.9 cm hypoechoic mass  off the interpolar region of the left kidney. Evaluation is markedly limited due to body habitus. This may potentially represent a cyst however solid mass is not excluded. Recommend follow-up in the outpatient setting with either pre and post enhanced CT or MRI.  Echogenic right kidney most compatible with chronic medical renal disease.  No hydronephrosis.  Fluid within the pelvis.    2-D echo - Normal LV function; grade 1 diastolic dysfunction; systolic and diastolic septal flattening; moderate RVE with severely reduced function; severe RAE with right to left septal bowing; severe TR;findings consistent with severely elevated pulmonary pressure.  LE venous Doppler - There is no obvious evidence of DVT or SVT noted in the bilateral lower extremities.  Doppler waveforms are pulsatile suggestive of fluid overload.  MRI and MRA Multifocal areas of acute infarction throughout both hemispheres, with the largest confluent area involving the RIGHT medial parasagittal frontal and posterior frontal cortex, distal ACA territory. No proximal large vessel occlusion on MRA intracranial exam.  CUS   : Bilateral: 1-39% ICA stenosis. Vertebral artery flow is antegrade.   TEE ;normal LV function; no LAA thrombus; severe RAE; mild RVE; severely reduced RV function; severe TR; bowing of atrial septum right to left; positive saline microcavitation study.   PHYSICAL EXAM  Temp:  [97.4 F (36.3 C)-99.2 F (37.3 C)] 97.4 F (36.3 C) (03/23 0828) Pulse Rate:  [30-166] 88 (03/23 0828) Resp:  [14-26] 23 (03/23 0828) BP: (126-173)/(81-125) 151/98 mmHg (03/23 0828) SpO2:  [58 %-100 %] 96 % (03/23 0828) Weight:  [221 lb 1.9 oz (100.3 kg)] 221 lb 1.9 oz (100.3 kg) (03/23 0500)  General - obese middle aged lady, well developed, in no apparent distress, lethargic.  Ophthalmologic - fundi not visualized due to incorporation.  Cardiovascular - irregularly irregular rate and rhythm.  Mental Status -  Level of arousal and orientation to months, place, and person were intact, but not to year. Language including expression, naming, repetition, comprehension was assessed and found intact. Fund of Knowledge was assessed and was impaired,    Cranial Nerves II - XII - II - Visual field intact OU. III, IV, VI - Extraocular movements intact. V - Facial sensation intact bilaterally. VII - Mild left facial droop,   VIII - Hearing & vestibular intact bilaterally. X - Palate elevates symmetrically. XI - Chin turning & shoulder shrug intact bilaterally. XII - Tongue protrusion intact.  Motor Strength - The patient's strength was 1/5 LUE and 0/5 without pain at LLE. RUE and RLE 5-/5. Bulk was normal and fasciculations were absent.   Motor Tone - Muscle tone was  assessed at the neck and appendages and was normal.  Reflexes - The patient's reflexes were 3+ bilateral biceps and patellar reflexes,   and she had no pathological reflexes.  Sensory - Light touch, temperature/pinprick were assessed and were normal.    Coordination - The patient had normal movements in the right hand with no ataxia or dysmetria.  Tremor was absent.  Gait and Station - not tested due to safety concerns.  ASSESSMENT/PLAN [name] is a 59 y.o. female with history of HTN, ESRD recently on HD, COPD presenting with generalized weakness and fluid overload. Started on hemodialysis. However she was found to have left-sided weakness after admission. CT head concerning for right ACA infarct. MRI showed bilateral infarcts, embolic pattern.  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  Resultant  left facial droop, left hemiparesis  MRI  embolic infarcts involving right ACA, left PCA and MCA  MRA  unremarkable Carotid Doppler  Bilateral: 1-39% ICA stenosis. Vertebral artery flow is antegrade.    2D Echo  unremarkable  LE Venous Doppler no DVT  TEE with positive bubble.  Cardiology has consulted. Does not feel pt has AF. Therefore, needs Loop. Have contacted EP. Can place tomorrow (Thurs).  HgbA1c 6.5  Hypercoagulable workup : negative lupus anticoagulant and antiphospholipid antibodies  Heparin sq for VTE prophylaxis  Diet renal W/1253mL fluid restriction   no antithrombotic prior to admission, now on aspirin 81 mg orally every day.   Patient counseled to be compliant with her antithrombotic medications  Ongoing aggressive stroke risk factor management  Therapy recommendations:  CIR  Disposition:  Pending  ESRD recently on HD  Nephrology on board  On hemodialysis  Flu  Droplet isolation  On Tamiflu  Hypertension  Home meds:   Lasix  Stable Permissive hypertension (OK if  <220/120) for 24-48 hours post stroke and then gradually normalized within 5-7 days.  Patient counseled to be compliant with her blood pressure medications  Hyperlipidemia  LDL 77, not at goal  New Lipitor 10 mg daily added  Continue statin on discharge  Tobacco abuse  Current smoker  Smoking cessation counseling provided  Pt is willing to quit  Other Stroke Risk Factors  Advanced age  Morbid obesity, Body mass index is 35.71 kg/(m^2).   COPD with CO2 retention  Other Active Problems  Leukocytosis  Other Pertinent History  Hyponatremia  Hospital day # 7 Plan check lower extremity venous Dopplers for DVT. Mobilize out of bed. Await rehabilitation transfer.  Delia Heady, MD   Stroke Neurology 06/24/2014 10:16 AM   To contact Stroke Continuity provider, please refer to [email or web address]. After hours, contact General Neurology

## 2014-06-24 NOTE — Progress Notes (Signed)
Hemodialysis- Goal lowered to 1L per Dr. Arlean HoppingSchertz. Done

## 2014-06-24 NOTE — Progress Notes (Signed)
Inpatient Rehabilitation  I note that we have received an IP Rehab consult request.  The admissions coordinator will follow up once the consult is completed.  Thank you!  Weldon PickingSusan Takiah Maiden PT Inpatient Rehab Admissions Coordinator Cell [phone] Office [phone]

## 2014-06-25 ENCOUNTER — Encounter (HOSPITAL_COMMUNITY)
Admission: EM | Disposition: A | Discharge status: Rehabilitation Facility | Payer: Self-pay | Source: Home / Self Care | Attending: Internal Medicine

## 2014-06-25 LAB — CBC
HEMATOCRIT: 38.8 % (ref 36.0–46.0)
Hemoglobin: 12.3 g/dL (ref 12.0–15.0)
MCH: 26.1 pg (ref 26.0–34.0)
MCHC: 31.7 g/dL (ref 30.0–36.0)
MCV: 82.4 fL (ref 78.0–100.0)
Platelets: 233 10*3/uL (ref 150–400)
RBC: 4.71 MIL/uL (ref 3.87–5.11)
RDW: 18.8 % — AB (ref 11.5–15.5)
WBC: 10 10*3/uL (ref 4.0–10.5)

## 2014-06-25 LAB — BASIC METABOLIC PANEL
Anion gap: 12 (ref 5–15)
BUN: 54 mg/dL — ABNORMAL HIGH (ref 6–23)
CO2: 25 mmol/L (ref 19–32)
Calcium: 8.1 mg/dL — ABNORMAL LOW (ref 8.4–10.5)
Chloride: 100 mmol/L (ref 96–112)
Creatinine, Ser: 3.39 mg/dL — ABNORMAL HIGH (ref 0.50–1.10)
GFR calc Af Amer: 16 mL/min — ABNORMAL LOW (ref >90)
GFR calc non Af Amer: 14 mL/min — ABNORMAL LOW (ref >90)
GLUCOSE: 113 mg/dL — AB (ref 70–99)
POTASSIUM: 5.2 mmol/L — AB (ref 3.5–5.1)
Sodium: 137 mmol/L (ref 135–145)

## 2014-06-25 SURGERY — LOOP RECORDER IMPLANT
Anesthesia: LOCAL

## 2014-06-25 NOTE — Progress Notes (Signed)
  [facility] Progress Note   Subjective: no new issues. No further tachycardia overnight.  Feeding herself breakfast.    Filed Vitals:   06/25/14 0451 06/25/14 0500 06/25/14 0754 06/25/14 0826  BP: 150/86  132/94   Pulse: 93  89   Temp: 97.4 F (36.3 C)  98 F (36.7 C)   TempSrc: Oral  Oral   Resp: 29  26   Height:      Weight:  98.7 kg (217 lb 9.5 oz)    SpO2: 95%  95% 96%   Exam: Alert, calm, pleasant, eating breakfast, leaned over onto L (stroke) side No jvd Chest clear bilat RRR tachy no MRG Abd soft, NTND, +BS No LE edema L arm AVF patent Neuro left body hemiparesis  HD: new start to HD       Assessment: 1. Rapid atrial arrhythmia - rate on HD yest were ~150 mostly. NSR now 2. ESRD , new start - HD initiated on 3/17. MWF HD here, CLIP in process 3. CVA - acute, ACA territory, left hemiparesis 4. Anemia no ESA, Hb ok 5. HTN - added norvasc back 6. MBD phoslo 7. COPD still smoking 8. 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 Flu / Influenza A / H1N1 - resolved clinically 10. HD access - had fistulogram w focal aneursymal dilatation, no intervention 11. Dispo - CIR to assess  Plan - plan HD tomorrow       Vinson Moselleob Suezette Lafave MD  pager [phone]    cell [phone]  06/25/2014, 9:01 AM     Recent Labs Lab 06/20/14 0850 06/22/14 0405 06/24/14 1339 06/25/14 0309  NA 134* 138 139 137  K 3.8 3.7 4.7 5.2*  CL 95* 98 101 100  CO2 25 26 27 25   GLUCOSE 125* 135* 75 113*  BUN 45* 64* 75* 54*  CREATININE 5.77* 5.63* 4.33* 3.39*  CALCIUM 7.4* 8.8 8.6 8.1*  PHOS 6.5*  --  5.8*  --     Recent Labs Lab 06/19/14 0232 06/20/14 0850 06/24/14 1339  AST 246* 89*  --   ALT 209* 148*  --   ALKPHOS 156* 151*  --   BILITOT 0.7 0.8  --   PROT 6.3 6.4  --   ALBUMIN 2.4* 2.5* 2.6*    Recent Labs Lab 06/22/14 0405 06/24/14 1338 06/25/14 0309  WBC 9.7 11.9* 10.0  HGB 12.5 12.5 12.3  HCT 39.0 38.8 38.8  MCV 79.8 80.7 82.4  PLT 319 299 233   . amLODipine  10 mg  Oral Daily  . antiseptic oral rinse  7 mL Mouth Rinse BID  . aspirin  325 mg Oral Daily  . atorvastatin  10 mg Oral q1800  . calcium acetate  667 mg Oral TID WC  . citalopram  20 mg Oral Daily  . fenofibrate  160 mg Oral Daily  . ferric gluconate (FERRLECIT/NULECIT) IV  125 mg Intravenous Q M,W,F-HD  . heparin subcutaneous  5,000 Units Subcutaneous 3 times per day  . ipratropium-albuterol  3 mL Nebulization TID  . metoprolol tartrate  25 mg Oral BID  . multivitamin  1 tablet Oral QHS  . predniSONE  40 mg Oral BID WC     sodium chloride, sodium chloride, acetaminophen, albuterol, allopurinol, feeding supplement (NEPRO CARB STEADY), hydrOXYzine, lidocaine (PF), lidocaine-prilocaine, metoprolol, ondansetron (ZOFRAN) IV, oxyCODONE, pentafluoroprop-tetrafluoroeth

## 2014-06-25 NOTE — Consult Note (Signed)
ELECTROPHYSIOLOGY CONSULT NOTE  [name] MRN: [id number], DOB/AGE: [birth year]   Admit date: 06/17/2014 Date of Consult: 06/25/2014  Primary Physician: Willey BladeEAN, [name], MD Primary Cardiologist: MS Reason for Consultation: Cryptogenic stroke; recommendations regarding Implantable Loop Recorder  History of Present Illness   Eileen C Mazon was admitted on 06/17/2014 with acute respiratory failure in the context of CKD and hypertension with weakness for two weeks   During hosp stay, noted to have L weakness and CT>> acute R Anterior Cerebral artery infarct w .  she has undergone workup for stroke including echocardiogram and carotid dopplers.  The patient has been monitored on telemetry which has demonstrated sinus rhythm with no afib   Inpatient stroke work-up included a TEE.>> PFO  Severe RAE and decreased RV function with severe TR  Echocardiogram this admission demonstrated   TTE >> normlal LV function and P pressure overload and PA press >= 40.  Lab work is remarkable for  Renal afailure prompting the need for dialysis    EP has been asked to evaluate for placement of an implantable loop recorder to monitor for atrial fibrillation.  ROS is negative except as outlined above.    Past Medical History  Diagnosis Date  . Asthma   . Hypertension   . Gout   . Arthritis   . Insomnia   . Chronic kidney disease   . Depression      Surgical History:  Past Surgical History  Procedure Laterality Date  . Multiple tooth extractions    . Av fistula placement Left 01/28/2014    Procedure: ARTERIOVENOUS (AV) FISTULA CREATION;  Surgeon: Sherren Kernsharles E Fields, MD;  [facility];  Service: Vascular;  Laterality: Left;  . Tee without cardioversion N/A 06/23/2014    Procedure: TRANSESOPHAGEAL ECHOCARDIOGRAM (TEE);  Surgeon: Lewayne BuntingBrian S Crenshaw, MD;  [facility];  Service: Cardiovascular;  Laterality: N/A;     Prescriptions prior to admission  Medication Sig Dispense Refill  Last Dose  . allopurinol (ZYLOPRIM) 100 MG tablet Take 100 mg by mouth 2 (two) times daily as needed (for gout).   unknown  . citalopram (CELEXA) 20 MG tablet Take 20 mg by mouth daily.   Past Month at Unknown time  . furosemide (LASIX) 80 MG tablet Take 80 mg by mouth daily.   Past Week at Unknown time  . hydrOXYzine (ATARAX/VISTARIL) 25 MG tablet Take 25 mg by mouth 3 (three) times daily as needed for anxiety.   unknown  . oxyCODONE (ROXICODONE) 5 MG immediate release tablet Take 1 tablet (5 mg total) by mouth every 6 (six) hours as needed for severe pain. (Patient not taking: Reported on 06/17/2014) 15 tablet 0 Taking    Inpatient Medications:  . amLODipine  10 mg Oral Daily  . antiseptic oral rinse  7 mL Mouth Rinse BID  . aspirin  325 mg Oral Daily  . atorvastatin  10 mg Oral q1800  . calcium acetate  667 mg Oral TID WC  . citalopram  20 mg Oral Daily  . fenofibrate  160 mg Oral Daily  . ferric gluconate (FERRLECIT/NULECIT) IV  125 mg Intravenous Q M,W,F-HD  . heparin subcutaneous  5,000 Units Subcutaneous 3 times per day  . ipratropium-albuterol  3 mL Nebulization TID  . metoprolol tartrate  25 mg Oral BID  . multivitamin  1 tablet Oral QHS  . predniSONE  40 mg Oral BID WC    Allergies: No Known Allergies  History   Social History  .  Marital Status: Single    Spouse Name: N/A  . Number of Children: N/A  . Years of Education: N/A   Occupational History  . Not on file.   Social History Main Topics  . Smoking status: Current Every Day Smoker -- 0.50 packs/day for 20 years    Types: Cigarettes  . Smokeless tobacco: Never Used  . Alcohol Use: Yes     Comment: occ  . Drug Use: No  . Sexual Activity: Not on file   Other Topics Concern  . Not on file   Social History Narrative     Family History  Problem Relation Age of Onset  . Diabetes Mother   . Hypertension Mother   . Heart disease Mother   . Heart attack Mother   . Peripheral vascular disease Mother   .  Diabetes Father   . Heart disease Father   . Hypertension Father   . Diabetes Sister   . Hypertension Sister   . Heart disease Sister     before age 22  . Heart attack Sister   . Peripheral vascular disease Sister   . Heart disease Brother   . Hyperlipidemia Daughter      Physical Exam: Filed Vitals:   06/24/14 2100 06/24/14 2348 06/25/14 0451 06/25/14 0500  BP: 157/112 130/107 150/86   Pulse: 129 85 93   Temp: 97.7 F (36.5 C) 97.2 F (36.2 C) 97.4 F (36.3 C)   TempSrc: Oral Oral Oral   Resp: Height:      Weight:    217 lb 9.5 oz (98.7 kg)  SpO2: 94% 98% 95%     GEN- The patient is well appearing, alert and oriented x 3 today.   Head- normocephalic, atraumatic Eyes-  Sclera clear, conjunctiva pink Ears- hearing intact Oropharynx- clear Neck- supple, Lungs- Clear to ausculation bilaterally, normal work of breathing Heart- Regular rate and rhythm, no murmurs, rubs or gallops, PMI not laterally displaced GI- soft, NT, ND, + BS Extremities- no clubbing, cyanosis, or edema MS- no significant deformity or atrophy Skin- no rash or lesion Psych- euthymic mood, full affect Neuro  L ehmi paresis   Labs:   Lab Results  Component Value Date   WBC 10.0 06/25/2014   HGB 12.3 06/25/2014   HCT 38.8 06/25/2014   MCV 82.4 06/25/2014   PLT 233 06/25/2014    Recent Labs Lab 06/20/14 0850  06/25/14 0309  NA 134*  < > 137  K 3.8  < > 5.2*  CL 95*  < > 100  CO2 25  < > 25  BUN 45*  < > 54*  CREATININE 5.77*  < > 3.39*  CALCIUM 7.4*  < > 8.1*  PROT 6.4  --   --   BILITOT 0.8  --   --   ALKPHOS 151*  --   --   ALT 148*  --   --   AST 89*  --   --   GLUCOSE 125*  < > 113*  < > = values in this interval not displayed. Lab Results  Component Value Date   TROPONINI 0.68* 06/20/2014   Lab Results  Component Value Date   CHOL 156 06/20/2014   Lab Results  Component Value Date   HDL 20* 06/20/2014   Lab Results  Component Value Date   LDLCALC 77  06/20/2014   Lab Results  Component Value Date   TRIG 294* 06/20/2014   Lab Results  Component  Value Date   CHOLHDL 7.8 06/20/2014   No results found for: LDLDIRECT  Lab Results  Component Value Date   DDIMER 7.28* 06/17/2014     Radiology/Studies: Dg Chest 2 View  06/17/2014   CLINICAL DATA:  Shortness of breath and chest tenderness for 1 week. Oxygen dependent.  EXAM: CHEST  2 VIEW  COMPARISON:  Chest radiograph 01/28/2014.  FINDINGS: Increasing cardiomegaly. No focal infiltrates or congestive failure. No effusion or pneumothorax. Bones unremarkable.  IMPRESSION: Increasing cardiomegaly.  No active infiltrates or failure.   Electronically Signed   By: Davonna Belling M.D.   On: 06/17/2014 17:18   Ct Head Wo Contrast  06/19/2014   CLINICAL DATA:  Left-sided weakness.  Dialysis patient.  EXAM: CT HEAD WITHOUT CONTRAST  TECHNIQUE: Contiguous axial images were obtained from the base of the skull through the vertex without intravenous contrast.  COMPARISON:  None  FINDINGS: Ill-defined hypodensity in the right medial frontal lobe consistent with acute infarct in the right anterior cerebral artery territory. No associated hemorrhage.  Negative for chronic ischemia. Ventricle size is normal. Negative for hemorrhage or mass lesion.  Calvarium intact.  Retention cyst in the left sphenoid sinus.  IMPRESSION: Acute infarct right anterior cerebral artery territory.  Critical Value/emergent results were called by telephone at the time of interpretation on 06/19/2014 at 12:53 pm to Dr. Billy Fischer , who verbally acknowledged these results.   Electronically Signed   By: Marlan Palau M.D.   On: 06/19/2014 12:53   Mr Maxine Glenn Head Wo Contrast  06/20/2014   CLINICAL DATA:  Shortness of breath and generalized weakness for 2 weeks. LEFT hemiparesis and LEFT facial droop of recent onset, but unspecified duration. History of chronic renal disease and hypertension. Acute respiratory failure. Abnormal CT head.  EXAM:  MRI HEAD WITHOUT CONTRAST  MRA HEAD WITHOUT CONTRAST  TECHNIQUE: Multiplanar, multiecho pulse sequences of the brain and surrounding structures were obtained without intravenous contrast. Angiographic images of the head were obtained using MRA technique without contrast.  COMPARISON:  CT head performed 06/19/2014.  FINDINGS: MRI HEAD FINDINGS  Only a limited number of pulse sequences could be obtained on the MRI exam. Diffusion axial and T1 sagittal images are interpreted.  Multiple areas of restricted diffusion throughout both hemispheres are identified consistent with acute infarction. The largest confluent area involves the RIGHT medial parasagittal frontal and posterior frontal cortex and subcortical white matter extending as far posteriorly as the central sulcus. Other smaller areas of restricted diffusion representing acute infarction can be seen in the LEFT anterior frontal cortex, LEFT parietal parasagittal cortex, and the LEFT parietal subcortical white matter.  No gross evidence for hemorrhage, mass lesion, hydrocephalus, or extra-axial fluid based on axial DWI. No pituitary enlargement or tonsillar herniation. Upper cervical region unremarkable. No osseous lesions.  MRA HEAD FINDINGS  Dolichoectatic but widely patent internal carotid arteries. Dolichoectatic and widely patent basilar artery. Both vertebrals contribute to basilar formation with the RIGHT dominant. There is no large vessel occlusion, proximal intracranial stenosis, or visible berry aneurysm. Moderately diseased anterior inferior cerebellar arteries bilaterally. No RIGHT PICA is visualized.  The distal RIGHT anterior cerebral artery in its pericallosal segment appears moderately diseased corresponding to the observed RIGHT ACA territory infarct.  IMPRESSION: Multifocal areas of acute infarction throughout both hemispheres, with the largest confluent area involving the RIGHT medial parasagittal frontal and posterior frontal cortex, distal ACA  territory.  A full MRI examination could not be completed, therefore comprehensive assessment of intracranial abnormalities, including  characterization of the observed acute infarcts, is not possible.  No proximal large vessel occlusion on MRA intracranial exam.   Electronically Signed   By: Davonna Belling M.D.   On: 06/20/2014 17:11   Mr Brain Wo Contrast  06/20/2014   CLINICAL DATA:  Shortness of breath and generalized weakness for 2 weeks. LEFT hemiparesis and LEFT facial droop of recent onset, but unspecified duration. History of chronic renal disease and hypertension. Acute respiratory failure. Abnormal CT head.  EXAM: MRI HEAD WITHOUT CONTRAST  MRA HEAD WITHOUT CONTRAST  TECHNIQUE: Multiplanar, multiecho pulse sequences of the brain and surrounding structures were obtained without intravenous contrast. Angiographic images of the head were obtained using MRA technique without contrast.  COMPARISON:  CT head performed 06/19/2014.  FINDINGS: MRI HEAD FINDINGS  Only a limited number of pulse sequences could be obtained on the MRI exam. Diffusion axial and T1 sagittal images are interpreted.  Multiple areas of restricted diffusion throughout both hemispheres are identified consistent with acute infarction. The largest confluent area involves the RIGHT medial parasagittal frontal and posterior frontal cortex and subcortical white matter extending as far posteriorly as the central sulcus. Other smaller areas of restricted diffusion representing acute infarction can be seen in the LEFT anterior frontal cortex, LEFT parietal parasagittal cortex, and the LEFT parietal subcortical white matter.  No gross evidence for hemorrhage, mass lesion, hydrocephalus, or extra-axial fluid based on axial DWI. No pituitary enlargement or tonsillar herniation. Upper cervical region unremarkable. No osseous lesions.  MRA HEAD FINDINGS  Dolichoectatic but widely patent internal carotid arteries. Dolichoectatic and widely patent basilar  artery. Both vertebrals contribute to basilar formation with the RIGHT dominant. There is no large vessel occlusion, proximal intracranial stenosis, or visible berry aneurysm. Moderately diseased anterior inferior cerebellar arteries bilaterally. No RIGHT PICA is visualized.  The distal RIGHT anterior cerebral artery in its pericallosal segment appears moderately diseased corresponding to the observed RIGHT ACA territory infarct.  IMPRESSION: Multifocal areas of acute infarction throughout both hemispheres, with the largest confluent area involving the RIGHT medial parasagittal frontal and posterior frontal cortex, distal ACA territory.  A full MRI examination could not be completed, therefore comprehensive assessment of intracranial abnormalities, including characterization of the observed acute infarcts, is not possible.  No proximal large vessel occlusion on MRA intracranial exam.   Electronically Signed   By: Davonna Belling M.D.   On: 06/20/2014 17:11   US Renal  06/18/2014   CLINICAL DATA:  Patient with acute renal failure.  EXAM: RENAL/URINARY TRACT ULTRASOUND COMPLETE  COMPARISON:  None.  FINDINGS: Right Kidney:  Length: 8.4 cm. No hydronephrosis. Renal cortex is diffusely increased in echogenicity.  Left Kidney:  Length: 9.1 cm. There is a 1.9 x 1.7 x 1.5 cm hypoechoic mass off of the interpolar region of the left kidney.  Bladder:  Poorly distended.  Ascites within the pelvis.  IMPRESSION: Indeterminate 1.9 cm hypoechoic mass off the interpolar region of the left kidney. Evaluation is markedly limited due to body habitus. This may potentially represent a cyst however solid mass is not excluded. Recommend follow-up in the outpatient setting with either pre and post enhanced CT or MRI.  Echogenic right kidney most compatible with chronic medical renal disease.  No hydronephrosis.  Fluid within the pelvis.  These results will be called to the ordering clinician or representative by the Radiologist Assistant,  and communication documented in the PACS or zVision Dashboard.   Electronically Signed   By: Annia Belt M.D.   On:  06/18/2014 13:20   Dg Chest Port 1 View  06/20/2014   CLINICAL DATA:  Hypoxia  EXAM: PORTABLE CHEST - 1 VIEW  COMPARISON:  06/17/2014  FINDINGS: Stable mild cardiomegaly. No mediastinal or hilar masses or evidence of adenopathy.  Clear lungs.  No pleural effusion or pneumothorax.  IMPRESSION: No acute cardiopulmonary disease. Stable appearance from the prior exam.   Electronically Signed   By: Amie Portland M.D.   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  EXAM: DIALYSIS AV FISTULOGRAM  COMPARISON:  None.  CONTRAST:  50mL OMNIPAQUE IOHEXOL 300 MG/ML  SOLN  FLUOROSCOPY TIME:  24 seconds.  PROCEDURE: The procedure, risks, benefits, and alternatives were explained to the patient. Questions regarding the procedure were encouraged and answered. The patient understands and consents to the procedure.  The left arm native fistula was prepped with Betadine in a sterile fashion, and a sterile drape was applied covering the operative field. A diagnostic fistulogram was performed via an 18 gauge angiocatheter introduced into venous outflow. Venous drainage was assessed to the level of the central veins in the chest. Proximal fistula was studied by reflux maneuver with temporary compression of venous outflow. After the procedure, the angiocatheter was removed and hemostasis obtained with manual compression.  COMPLICATIONS: None  FINDINGS: Antecubital anastomosis between the brachial artery and cephalic vein is normally patent. After a short segment, there is focal aneurysmal dilatation of the vein in the antecubital fossa. There is competing venous outflow noted in the cephalic vein as well as the deep venous system. The cephalic vein is tortuous but shows no significant focal  stenoses. Central veins are normally patent, including the SVC.  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 There is competing venous outflow in the deep venous system of the upper arm.  ACCESS: No percutaneous intervention was indicated today.   Electronically Signed   By: Irish Lack M.D.   On: 06/22/2014 13:20    12-lead ECG    All prior EKG's in EPIC reviewed with out Afib Telemetry epeated episodes of SVT relatively short; there is long RP consisttent with PJRT but also without clearly inverted P waves suggesting taht both SVTs being of similar mechanism should be atrial tach   Assessment and Plan:  1. Cryptogenic stroke The patient presents with cryptogenic stroke in the context of multiple comorbiidities and severe RV and RA dysfunction and enlargement  She has significant atrial arrhythmias, and her pretest likelihood of having atrial fib is sufficiently high that would use 30 day event recorder as opposed to implantable loop recorder  Sleep disordered breathing and daytime somnolence  Needs outpt monitor   We will haelp arrange at discharge   Please call   Needs outpt sleep study through PCP   Please call with questions.   Gypsy Balsam, NP 06/25/2014 7:23 AM

## 2014-06-25 NOTE — Progress Notes (Signed)
Report called to Stephanie Jacksonori, RN on 3W. Updated on patient history, current status, and plan of care.  Patient is stable and able to transfer at this time.

## 2014-06-25 NOTE — Progress Notes (Signed)
STROKE TEAM PROGRESS NOTE   HISTORY [name] is a 59 y.o. female with a history of CKD and hypertension came to the [facility] ED on 3/16 complaining of shortness of breath and generalized weakness x 2 weeks. Admitted with acute hypoxemic respiratory failure. During hospital stay noted to have weakness of her left side. 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   Patient was not administered TPA secondary to late presentation.   SUBJECTIVE (INTERVAL HISTORY) Her family is not at the bedside.  Overall she feels her condition is unchanged, TEE showed no intra-atrial clot but positive bubble study indicating of a small PFO. LE venous dopplers 06/18/14 showed no DVT. Dr Graciela Husbands feels her pretest likelihood of having atrial fibrillation is high enough to justify 30 day outpatient monitor and not place a loop recorder and hence it will be done as an outpatient   OBJECTIVE Temp:  [97 F (36.1 C)-98 F (36.7 C)] 98 F (36.7 C) (03/24 0754) Pulse Rate:  [85-129] 89 (03/24 0754) Cardiac Rhythm:  [-] Normal sinus rhythm;Sinus tachycardia (03/24 0900) Resp:  [16-29] 26 (03/24 0754) BP: (130-157)/(86-122) 132/94 mmHg (03/24 0754) SpO2:  [89 %-100 %] 96 % (03/24 0826) FiO2 (%):  [28 %] 28 % (03/24 0826) Weight:  [217 lb 9.5 oz (98.7 kg)-220 lb 0.3 oz (99.8 kg)] 217 lb 9.5 oz (98.7 kg) (03/24 0500)   Recent Labs Lab 06/22/14 2222 06/23/14 0825 06/23/14 1301 06/23/14 1659 06/24/14 0821  GLUCAP 143* 98 101* 121* 70    Recent Labs Lab 06/19/14 0232 06/20/14 0850 06/22/14 0405 06/24/14 1334 06/24/14 1339 06/25/14 0309  NA 134* 134* 138  --  139 137  K 4.3 3.8 3.7  --  4.7 5.2*  CL 97 95* 98  --  101 100  CO2 --  27 25  GLUCOSE 218* 125* 135*  --  75 113*  BUN 60* 45* 64*  --  75* 54*  CREATININE 6.12* 5.77* 5.63*  --  4.33* 3.39*  CALCIUM 7.0* 7.4* 8.8  --  8.6 8.1*  MG  --   --   --  2.0  --   --    PHOS  --  6.5*  --   --  5.8*  --     Recent Labs Lab 06/19/14 0232 06/20/14 0850 06/24/14 1339  AST 246* 89*  --   ALT 209* 148*  --   ALKPHOS 156* 151*  --   BILITOT 0.7 0.8  --   PROT 6.3 6.4  --   ALBUMIN 2.4* 2.5* 2.6*    Recent Labs Lab 06/19/14 0232 06/20/14 0850 06/22/14 0405 06/24/14 1338 06/25/14 0309  WBC 8.5 13.9* 9.7 11.9* 10.0  HGB 11.8* 12.3 12.5 12.5 12.3  HCT 37.7 38.2 39.0 38.8 38.8  MCV 81.6 80.4 79.8 80.7 82.4  PLT 298 289 319 299 233    Recent Labs Lab 06/20/14 2037  TROPONINI 0.68*   No results for input(s): LABPROT, INR in the last 72 hours. No results for input(s): COLORURINE, LABSPEC, PHURINE, GLUCOSEU, HGBUR, BILIRUBINUR, KETONESUR, PROTEINUR, UROBILINOGEN, NITRITE, LEUKOCYTESUR in the last 72 hours.  Invalid input(s): APPERANCEUR     Component Value Date/Time   CHOL 156 06/20/2014 2037   TRIG 294* 06/20/2014 2037   HDL 20* 06/20/2014 2037   CHOLHDL 7.8 06/20/2014 2037   VLDL 59* 06/20/2014 2037   LDLCALC 77 06/20/2014 2037   Lab  Results  Component Value Date   HGBA1C 6.5* 06/20/2014   No results found for: LABOPIA, COCAINSCRNUR, LABBENZ, AMPHETMU, THCU, LABBARB  No results for input(s): ETH in the last 168 hours.   I have personally reviewed the radiological images below and agree with the radiology interpretations.  Ct Head Wo Contrast 06/19/2014    Acute infarct right anterior cerebral artery territory.    [place] Renal 06/18/2014    Indeterminate 1.9 cm hypoechoic mass off the interpolar region of the left kidney. Evaluation is markedly limited due to body habitus. This may potentially represent a cyst however solid mass is not excluded. Recommend follow-up in the outpatient setting with either pre and post enhanced CT or MRI.  Echogenic right kidney most compatible with chronic medical renal disease.  No hydronephrosis.  Fluid within the pelvis.    2-D echo - Normal LV function; grade 1 diastolic dysfunction; systolic and  diastolic septal flattening; moderate RVE with severely reduced function; severe RAE with right to left septal bowing; severe TR;findings consistent with severely elevated pulmonary pressure.  LE venous Doppler - There is no obvious evidence of DVT or SVT noted in the bilateral lower extremities. Doppler waveforms are pulsatile suggestive of fluid overload.  MRI and MRA Multifocal areas of acute infarction throughout both hemispheres, with the largest confluent area involving the RIGHT medial parasagittal frontal and posterior frontal cortex, distal ACA territory. No proximal large vessel occlusion on MRA intracranial exam.  CUS   : Bilateral: 1-39% ICA stenosis. Vertebral artery flow is antegrade.   TEE ;normal LV function; no LAA thrombus; severe RAE; mild RVE; severely reduced RV function; severe TR; bowing of atrial septum right to left; positive saline microcavitation study.   PHYSICAL EXAM  Temp:  [97 F (36.1 C)-98 F (36.7 C)] 98 F (36.7 C) (03/24 0754) Pulse Rate:  [85-129] 89 (03/24 0754) Resp:  [16-29] 26 (03/24 0754) BP: (130-157)/(86-122) 132/94 mmHg (03/24 0754) SpO2:  [89 %-100 %] 96 % (03/24 0826) FiO2 (%):  [28 %] 28 % (03/24 0826) Weight:  [217 lb 9.5 oz (98.7 kg)-220 lb 0.3 oz (99.8 kg)] 217 lb 9.5 oz (98.7 kg) (03/24 0500)  General - obese middle aged lady, well developed, in no apparent distress, lethargic.  Ophthalmologic - fundi not visualized due to incorporation.  Cardiovascular - irregularly irregular rate and rhythm.  Mental Status -  Level of arousal and orientation to months, place, and person were intact, but not to year. Language including expression, naming, repetition, comprehension was assessed and found intact. Fund of Knowledge was assessed and was impaired,    Cranial Nerves II - XII - II - Visual field intact OU. III, IV, VI - Extraocular movements intact. V - Facial sensation intact bilaterally. VII - Mild left facial droop,   VIII  - Hearing & vestibular intact bilaterally. X - Palate elevates symmetrically. XI - Chin turning & shoulder shrug intact bilaterally. XII - Tongue protrusion intact.  Motor Strength - The patient's strength was 1/5 LUE and 0/5 without pain at LLE. RUE and RLE 5-/5. Bulk was normal and fasciculations were absent.   Motor Tone - Muscle tone was assessed at the neck and appendages and was normal.  Reflexes - The patient's reflexes were 3+ bilateral biceps and patellar reflexes,   and she had no pathological reflexes.  Sensory - Light touch, temperature/pinprick were assessed and were normal.    Coordination - The patient had normal movements in the right hand with no ataxia or dysmetria.  Tremor was absent.  Gait and Station - not tested due to safety concerns.  ASSESSMENT/PLAN [name] is a 59 y.o. female with history of HTN, ESRD recently on HD, COPD presenting with generalized weakness and fluid overload. Started on hemodialysis. However she was found to have left-sided weakness after admission. CT head concerning for right ACA infarct. MRI showed bilateral infarcts, embolic pattern.  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  Resultant  left facial droop, left hemiparesis  MRI  embolic infarcts involving right ACA, left PCA and MCA  MRA  unremarkable Carotid Doppler  Bilateral: 1-39% ICA stenosis. Vertebral artery flow is antegrade.    2D Echo  unremarkable  LE Venous Doppler no DVT  TEE with positive bubble.  Cardiology has consulted. Does not feel pt has AF. Therefore, needs Loop. Have contacted EP. Can place tomorrow (Thurs).  HgbA1c 6.5  Hypercoagulable workup : negative lupus anticoagulant and antiphospholipid antibodies  Heparin sq for VTE prophylaxis  Diet renal W/1234mL fluid restriction   no antithrombotic prior to admission, now on aspirin 81 mg orally every day.   Patient counseled  to be compliant with her antithrombotic medications  Ongoing aggressive stroke risk factor management  Therapy recommendations:  CIR  Disposition:  Pending  ESRD recently on HD  Nephrology on board  On hemodialysis  Flu  Droplet isolation  On Tamiflu  Hypertension  Home meds:   Lasix  Stable Permissive hypertension (OK if <220/120) for 24-48 hours post stroke and then gradually normalized within 5-7 days.  Patient counseled to be compliant with her blood pressure medications  Hyperlipidemia  LDL 77, not at goal  New Lipitor 10 mg daily added  Continue statin on discharge  Tobacco abuse  Current smoker  Smoking cessation counseling provided  Pt is willing to quit  Other Stroke Risk Factors  Advanced age  Morbid obesity, Body mass index is 35.14 kg/(m^2).   COPD with CO2 retention  Other Active Problems  Leukocytosis  Other Pertinent History  Hyponatremia  Hospital day # 8  Await rehabilitation transfer.  Continue aspirin and Lipitor. Outpatient 30 day heart monitor per cardiology after discharge. Stroke team will sign off. Kindly call for questions. Follow-up as an outpatient in stroke clinic in 2 months.  Delia Heady, MD   Stroke Neurology 06/25/2014 10:51 AM   To contact Stroke Continuity provider, please refer to [email or web address]. After hours, contact General Neurology

## 2014-06-25 NOTE — Progress Notes (Signed)
Pt started a run of SVTs up to 135HR. Paged Cards Fellow, awaiting orders

## 2014-06-25 NOTE — Progress Notes (Signed)
[facility]                                                                              Patient Demographics  [name], is a 59 y.o. female, DOB - 12/18/1955, ZOX:[id number]  Admit date - 06/17/2014   Admitting Physician Lupita Leash, MD  Outpatient Primary MD for the patient is August Saucer, [name], MD  LOS - 8   Chief Complaint  Patient presents with  . Shortness of Breath  . Asthma  . Cough      HPI on Dr. Cyril Mourning (PCCM) on 06/17/2014 59 y/o female with CKD and hypertension came to the [facility] ED on 3/16 complaining of shortness of breath and generalized weakness x 2 weeks. Worse with exhertion, also described chest tightness during these times. She noted not taking her diuretic medications for majority of that time period. RLE edema was "bad" but has now resolved. She denied fever but noted some mild chills. In the ED she was noted to be hypoxemic and in what is believed to be acute on chronic renal failure. She was given lasix and renal and critical care medicine were consulted. Currently she feels much better and is breathing comfortably on 100% NRB.   Interim history Patient admitted to [facility] service and found to have uncompensated metabolic acidosis from worsening uremia, H1N1 flu. Renal ultrasound done noted mass off of left kidney. Patient transferred to [facility] for dialysis which greatly improved her breathing. On 3/18 patient noted to be hemiparetic on the left side and a CT scan noted an infarct of the right ACA and neurology consulted. Patient since stabilized and transferred to step down unit to hospitalist service. Patient's MRI notes focal areas of acute infarction throughout both hemispheres, consistent with an embolic phenomenon. Patient underwent stroke workup which had noted a negative TEE for clot, negative carotid Dopplers although she has had episodes of a flutter. She will likely need chronic long-term anticoagulation. Breathing continues to improve  and patient is down to approximately 3 L. She has continued to receive dialysis, followed by nephrology  Assessment & Plan   Acute respiratory failure with hypoxia -Patient continues to need nasal cannula, approximately 3 L -Chest x-ray: no acute cardiopulmonary disease -Likely multifactorial including influenza, chronic tobacco use with underlying COPD/volume overload  COPD with acute exacerbation -Possibly due to cor pulmonale -Continue steroids, taper down -Continue nebulizers, incentive spirometry   Acute CVA -MRI/MRA: Multifocal areas of acute infarction throughout both hemispheres, largest confluent area involving the right medial parasagittal frontal and posterior frontal cortex, distal ACA territory -TEE: Normal LV function, no LAA thrombus,severe RAE; mild RVE; severely reduced RV function; severe TR; bowing of atrial septum right to left; positive saline microcavitation study- PFO -Carotid Doppler: Bilateral 1-39% ICA stenosis, vertebral artery flow is antegrade -Lower extremity Doppler showed no SVT or DVT in bilateral lower extremities, Doppler waveforms are pulsatile suggestive of fluid overload -Echocardiogram: EF 60-65%, grade 1 diastolic dysfunction, severely elevated pulmonary pressure -LDL 77, hemoglobin A1c 6.5 -Neurology consulted and appreciated -Hypercoagulable workup: negative lupus anticoagulant antiphospholipid antibody -Continue aspirin-full dose, statin -Possible need for loop recorder vs 30day event monitor, which will be arranged for by cardiology -  PT and OT consulted and recommended CIR -CIR consulted and pending evaluation  Atrial flutter/paroxysmal SVT -Appears in SR at this time -Cardiology consulted and appreciated- recommended 30 day event monitor, which will be arranged at discharge -Continue continue metoprolol -Patient does have severe RV and RA dysfunction and enlargement on echo- making her more likely to have Afib and other  arrhythmias  Chronic diastolic heart failure -Echocardiogram: EF 60-65%, grade 1 diastolic dysfunction -Continue to monitor intake, daily weights, hemodialysis  Hypertension -Continue amlodipine, metoprolol  End-stage renal disease on hemodialysis -Nephrology consulted and appreciated -Fistulogram conducted showing dilatation of the cephalic vein beyond the arterial anastomosis  Left renal mass -Found on renal ultrasound 06/18/2014, intermediate 1.9 cm hypoechoic mass of the interpolar region of the left kidney -Patient will need renal MRI  Influenza, H1N1 -Patient completed course of Tamiflu 06/22/2014  Hyperlipidemia -Lipid profile: TC 156, TG 294, HDL 20, LDL 77 -Continue statin and fenofibrate, will monitor for signs of myalgia  Tobacco abuse -Smoking cessation counseling given  Depression -Continue Celexa  Obesity -BMI greater than 30 -Patient with primary care physician regarding lifestyle modifications upon discharge  Code Status: Full  Family Communication: None at bedside  Disposition Plan: Admitted, transfer to tele  Time Spent in minutes   30 minutes  Procedures  Echocardiogram TEE Carotid Doppler Renal ultrasound Lower extremity Doppler  Consults   PCCM Cardiology Neurology Nephrology Inpatient rehab  DVT Prophylaxis  heparin  Lab Results  Component Value Date   PLT 233 06/25/2014    Medications  Scheduled Meds: . amLODipine  10 mg Oral Daily  . antiseptic oral rinse  7 mL Mouth Rinse BID  . aspirin  325 mg Oral Daily  . atorvastatin  10 mg Oral q1800  . calcium acetate  667 mg Oral TID WC  . citalopram  20 mg Oral Daily  . fenofibrate  160 mg Oral Daily  . ferric gluconate (FERRLECIT/NULECIT) IV  125 mg Intravenous Q M,W,F-HD  . heparin subcutaneous  5,000 Units Subcutaneous 3 times per day  . ipratropium-albuterol  3 mL Nebulization TID  . metoprolol tartrate  25 mg Oral BID  . multivitamin  1 tablet Oral QHS  . predniSONE   40 mg Oral BID WC   Continuous Infusions:  PRN Meds:.sodium chloride, sodium chloride, acetaminophen, albuterol, allopurinol, feeding supplement (NEPRO CARB STEADY), hydrOXYzine, lidocaine (PF), lidocaine-prilocaine, metoprolol, ondansetron (ZOFRAN) IV, oxyCODONE, pentafluoroprop-tetrafluoroeth  Antibiotics    Anti-infectives    Start     Dose/Rate Route Frequency Ordered Stop   06/19/14 1800  oseltamivir (TAMIFLU) capsule 30 mg     30 mg Oral Every M-W-F (1800) 06/19/14 1003 06/22/14 1841   06/18/14 1600  oseltamivir (TAMIFLU) capsule 30 mg  Status:  Discontinued     30 mg Oral Daily 06/18/14 1538 06/19/14 1003        Subjective:   [name] seen and examined today.  Patient denies any pain at this time and feels better as compared to previous days. Feels her breathing has improved.  She denies chest pain, abdominal pain, nausea, vomiting, diarrhea.   Objective:   Filed Vitals:   06/25/14 0451 06/25/14 0500 06/25/14 0754 06/25/14 0826  BP: 150/86  132/94   Pulse: 93  89   Temp: 97.4 F (36.3 C)  98 F (36.7 C)   TempSrc: Oral  Oral   Resp: 29  26   Height:      Weight:  98.7 kg (217 lb 9.5 oz)  SpO2: 95%  95% 96%    Wt Readings from Last 3 Encounters:  06/25/14 98.7 kg (217 lb 9.5 oz)  06/01/14 102.059 kg (225 lb)  01/28/14 104.327 kg (230 lb)     Intake/Output Summary (Last 24 hours) at 06/25/14 1135 Last data filed at 06/24/14 1730  Gross per 24 hour  Intake    310 ml  Output    763 ml  Net   -453 ml    Exam  General: Well developed, well nourished, NAD  HEENT: NCAT, mucous membranes moist.   Cardiovascular: S1 S2 auscultated, no murmur, regular rate and rhythm  Respiratory: Clear to auscultation   Abdomen: Soft, obese, nontender, nondistended, + bowel sounds  Extremities: warm dry without cyanosis clubbing or edema  Data Review    Micro Results Recent Results (from the past 240 hour(s))  MRSA PCR Screening     Status: None   Collection  Time: 06/17/14  9:29 PM  Result Value Ref Range Status   MRSA by PCR NEGATIVE NEGATIVE Final    Comment:        The GeneXpert MRSA Assay (FDA approved for NASAL specimens only), is one component of a comprehensive MRSA colonization surveillance program. It is not intended to diagnose MRSA infection nor to guide or monitor treatment for MRSA infections.   Clostridium Difficile by PCR     Status: None   Collection Time: 06/18/14  3:52 AM  Result Value Ref Range Status   C difficile by pcr NEGATIVE NEGATIVE Final    Radiology Reports Dg Chest 2 View  06/17/2014   CLINICAL DATA:  Shortness of breath and chest tenderness for 1 week. Oxygen dependent.  EXAM: CHEST  2 VIEW  COMPARISON:  Chest radiograph 01/28/2014.  FINDINGS: Increasing cardiomegaly. No focal infiltrates or congestive failure. No effusion or pneumothorax. Bones unremarkable.  IMPRESSION: Increasing cardiomegaly.  No active infiltrates or failure.   Electronically Signed   By: Davonna Belling M.D.   On: 06/17/2014 17:18   Ct Head Wo Contrast  06/19/2014   CLINICAL DATA:  Left-sided weakness.  Dialysis patient.  EXAM: CT HEAD WITHOUT CONTRAST  TECHNIQUE: Contiguous axial images were obtained from the base of the skull through the vertex without intravenous contrast.  COMPARISON:  None  FINDINGS: Ill-defined hypodensity in the right medial frontal lobe consistent with acute infarct in the right anterior cerebral artery territory. No associated hemorrhage.  Negative for chronic ischemia. Ventricle size is normal. Negative for hemorrhage or mass lesion.  Calvarium intact.  Retention cyst in the left sphenoid sinus.  IMPRESSION: Acute infarct right anterior cerebral artery territory.  Critical Value/emergent results were called by telephone at the time of interpretation on 06/19/2014 at 12:53 pm to Dr. Billy Fischer , who verbally acknowledged these results.   Electronically Signed   By: Marlan Palau M.D.   On: 06/19/2014 12:53   Mr Maxine Glenn  Head Wo Contrast  06/20/2014   CLINICAL DATA:  Shortness of breath and generalized weakness for 2 weeks. LEFT hemiparesis and LEFT facial droop of recent onset, but unspecified duration. History of chronic renal disease and hypertension. Acute respiratory failure. Abnormal CT head.  EXAM: MRI HEAD WITHOUT CONTRAST  MRA HEAD WITHOUT CONTRAST  TECHNIQUE: Multiplanar, multiecho pulse sequences of the brain and surrounding structures were obtained without intravenous contrast. Angiographic images of the head were obtained using MRA technique without contrast.  COMPARISON:  CT head performed 06/19/2014.  FINDINGS: MRI HEAD FINDINGS  Only a limited number of pulse  sequences could be obtained on the MRI exam. Diffusion axial and T1 sagittal images are interpreted.  Multiple areas of restricted diffusion throughout both hemispheres are identified consistent with acute infarction. The largest confluent area involves the RIGHT medial parasagittal frontal and posterior frontal cortex and subcortical white matter extending as far posteriorly as the central sulcus. Other smaller areas of restricted diffusion representing acute infarction can be seen in the LEFT anterior frontal cortex, LEFT parietal parasagittal cortex, and the LEFT parietal subcortical white matter.  No gross evidence for hemorrhage, mass lesion, hydrocephalus, or extra-axial fluid based on axial DWI. No pituitary enlargement or tonsillar herniation. Upper cervical region unremarkable. No osseous lesions.  MRA HEAD FINDINGS  Dolichoectatic but widely patent internal carotid arteries. Dolichoectatic and widely patent basilar artery. Both vertebrals contribute to basilar formation with the RIGHT dominant. There is no large vessel occlusion, proximal intracranial stenosis, or visible berry aneurysm. Moderately diseased anterior inferior cerebellar arteries bilaterally. No RIGHT PICA is visualized.  The distal RIGHT anterior cerebral artery in its pericallosal  segment appears moderately diseased corresponding to the observed RIGHT ACA territory infarct.  IMPRESSION: Multifocal areas of acute infarction throughout both hemispheres, with the largest confluent area involving the RIGHT medial parasagittal frontal and posterior frontal cortex, distal ACA territory.  A full MRI examination could not be completed, therefore comprehensive assessment of intracranial abnormalities, including characterization of the observed acute infarcts, is not possible.  No proximal large vessel occlusion on MRA intracranial exam.   Electronically Signed   By: Davonna BellingJohn  Curnes M.D.   On: 06/20/2014 17:11   Mr Brain Wo Contrast  06/20/2014   CLINICAL DATA:  Shortness of breath and generalized weakness for 2 weeks. LEFT hemiparesis and LEFT facial droop of recent onset, but unspecified duration. History of chronic renal disease and hypertension. Acute respiratory failure. Abnormal CT head.  EXAM: MRI HEAD WITHOUT CONTRAST  MRA HEAD WITHOUT CONTRAST  TECHNIQUE: Multiplanar, multiecho pulse sequences of the brain and surrounding structures were obtained without intravenous contrast. Angiographic images of the head were obtained using MRA technique without contrast.  COMPARISON:  CT head performed 06/19/2014.  FINDINGS: MRI HEAD FINDINGS  Only a limited number of pulse sequences could be obtained on the MRI exam. Diffusion axial and T1 sagittal images are interpreted.  Multiple areas of restricted diffusion throughout both hemispheres are identified consistent with acute infarction. The largest confluent area involves the RIGHT medial parasagittal frontal and posterior frontal cortex and subcortical white matter extending as far posteriorly as the central sulcus. Other smaller areas of restricted diffusion representing acute infarction can be seen in the LEFT anterior frontal cortex, LEFT parietal parasagittal cortex, and the LEFT parietal subcortical white matter.  No gross evidence for hemorrhage,  mass lesion, hydrocephalus, or extra-axial fluid based on axial DWI. No pituitary enlargement or tonsillar herniation. Upper cervical region unremarkable. No osseous lesions.  MRA HEAD FINDINGS  Dolichoectatic but widely patent internal carotid arteries. Dolichoectatic and widely patent basilar artery. Both vertebrals contribute to basilar formation with the RIGHT dominant. There is no large vessel occlusion, proximal intracranial stenosis, or visible berry aneurysm. Moderately diseased anterior inferior cerebellar arteries bilaterally. No RIGHT PICA is visualized.  The distal RIGHT anterior cerebral artery in its pericallosal segment appears moderately diseased corresponding to the observed RIGHT ACA territory infarct.  IMPRESSION: Multifocal areas of acute infarction throughout both hemispheres, with the largest confluent area involving the RIGHT medial parasagittal frontal and posterior frontal cortex, distal ACA territory.  A full MRI examination  could not be completed, therefore comprehensive assessment of intracranial abnormalities, including characterization of the observed acute infarcts, is not possible.  No proximal large vessel occlusion on MRA intracranial exam.   Electronically Signed   By: Davonna Belling M.D.   On: 06/20/2014 17:11   US Renal  06/18/2014   CLINICAL DATA:  Patient with acute renal failure.  EXAM: RENAL/URINARY TRACT ULTRASOUND COMPLETE  COMPARISON:  None.  FINDINGS: Right Kidney:  Length: 8.4 cm. No hydronephrosis. Renal cortex is diffusely increased in echogenicity.  Left Kidney:  Length: 9.1 cm. There is a 1.9 x 1.7 x 1.5 cm hypoechoic mass off of the interpolar region of the left kidney.  Bladder:  Poorly distended.  Ascites within the pelvis.  IMPRESSION: Indeterminate 1.9 cm hypoechoic mass off the interpolar region of the left kidney. Evaluation is markedly limited due to body habitus. This may potentially represent a cyst however solid mass is not excluded. Recommend follow-up  in the outpatient setting with either pre and post enhanced CT or MRI.  Echogenic right kidney most compatible with chronic medical renal disease.  No hydronephrosis.  Fluid within the pelvis.  These results will be called to the ordering clinician or representative by the Radiologist Assistant, and communication documented in the PACS or zVision Dashboard.   Electronically Signed   By: Annia Belt M.D.   On: 06/18/2014 13:20   Dg Chest Port 1 View  06/20/2014   CLINICAL DATA:  Hypoxia  EXAM: PORTABLE CHEST - 1 VIEW  COMPARISON:  06/17/2014  FINDINGS: Stable mild cardiomegaly. No mediastinal or hilar masses or evidence of adenopathy.  Clear lungs.  No pleural effusion or pneumothorax.  IMPRESSION: No acute cardiopulmonary disease. Stable appearance from the prior exam.   Electronically Signed   By: Amie Portland M.D.   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  EXAM: DIALYSIS AV FISTULOGRAM  COMPARISON:  None.  CONTRAST:  50mL OMNIPAQUE IOHEXOL 300 MG/ML  SOLN  FLUOROSCOPY TIME:  24 seconds.  PROCEDURE: The procedure, risks, benefits, and alternatives were explained to the patient. Questions regarding the procedure were encouraged and answered. The patient understands and consents to the procedure.  The left arm native fistula was prepped with Betadine in a sterile fashion, and a sterile drape was applied covering the operative field. A diagnostic fistulogram was performed via an 18 gauge angiocatheter introduced into venous outflow. Venous drainage was assessed to the level of the central veins in the chest. Proximal fistula was studied by reflux maneuver with temporary compression of venous outflow. After the procedure, the angiocatheter was removed and hemostasis obtained with manual compression.  COMPLICATIONS: None  FINDINGS: Antecubital anastomosis between the brachial artery  and cephalic vein is normally patent. After a short segment, there is focal aneurysmal dilatation of the vein in the antecubital fossa. There is competing venous outflow noted in the cephalic vein as well as the deep venous system. The cephalic vein is tortuous but shows no significant focal stenoses. Central veins are normally patent, including the SVC.  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 There is competing venous outflow in the deep venous system of the upper arm.  ACCESS: No percutaneous intervention was indicated today.   Electronically Signed   By: Irish Lack M.D.   On: 06/22/2014  13:20    CBC  Recent Labs Lab 06/19/14 0232 06/20/14 0850 06/22/14 0405 06/24/14 1338 06/25/14 0309  WBC 8.5 13.9* 9.7 11.9* 10.0  HGB 11.8* 12.3 12.5 12.5 12.3  HCT 37.7 38.2 39.0 38.8 38.8  PLT 298 289 319 299 233  MCV 81.6 80.4 79.8 80.7 82.4  MCH 25.5* 25.9* 25.6* 26.0 26.1  MCHC 31.3 32.2 32.1 32.2 31.7  RDW 19.5* 19.0* 18.8* 18.7* 18.8*    Chemistries   Recent Labs Lab 06/19/14 0232 06/20/14 0850 06/22/14 0405 06/24/14 1334 06/24/14 1339 06/25/14 0309  NA 134* 134* 138  --  139 137  K 4.3 3.8 3.7  --  4.7 5.2*  CL 97 95* 98  --  101 100  CO2 22 25 26   --  27 25  GLUCOSE 218* 125* 135*  --  75 113*  BUN 60* 45* 64*  --  75* 54*  CREATININE 6.12* 5.77* 5.63*  --  4.33* 3.39*  CALCIUM 7.0* 7.4* 8.8  --  8.6 8.1*  MG  --   --   --  2.0  --   --   AST 246* 89*  --   --   --   --   ALT 209* 148*  --   --   --   --   ALKPHOS 156* 151*  --   --   --   --   BILITOT 0.7 0.8  --   --   --   --    ------------------------------------------------------------------------------------------------------------------ estimated creatinine clearance is 21.4 mL/min (by C-G formula based on Cr of  3.39). ------------------------------------------------------------------------------------------------------------------ No results for input(s): HGBA1C in the last 72 hours. ------------------------------------------------------------------------------------------------------------------ No results for input(s): CHOL, HDL, LDLCALC, TRIG, CHOLHDL, LDLDIRECT in the last 72 hours. ------------------------------------------------------------------------------------------------------------------ No results for input(s): TSH, T4TOTAL, T3FREE, THYROIDAB in the last 72 hours.  Invalid input(s): FREET3 ------------------------------------------------------------------------------------------------------------------ No results for input(s): VITAMINB12, FOLATE, FERRITIN, TIBC, IRON, RETICCTPCT in the last 72 hours.  Coagulation profile No results for input(s): INR, PROTIME in the last 168 hours.  No results for input(s): DDIMER in the last 72 hours.  Cardiac Enzymes  Recent Labs Lab 06/20/14 2037  TROPONINI 0.68*   ------------------------------------------------------------------------------------------------------------------ Invalid input(s): POCBNP    Chastidy Ranker D.O. on 06/25/2014 at 11:35 AM  Between 7am to 7pm - Pager - [phone]  After 7pm go to [email or web address] - password [email or web address]  And look for the night coverage person covering for me after hours  [facility] Office  [phone]

## 2014-06-25 NOTE — Progress Notes (Signed)
Attempted to call report to [place], RN on 3W. RN was discharging another patient at this time and will return call when she is available.

## 2014-06-26 DIAGNOSIS — T82898A Other specified complication of vascular prosthetic devices, implants and grafts, initial encounter: Secondary | ICD-10-CM

## 2014-06-26 DIAGNOSIS — I471 Supraventricular tachycardia: Secondary | ICD-10-CM | POA: Insufficient documentation

## 2014-06-26 LAB — BASIC METABOLIC PANEL
ANION GAP: 11 (ref 5–15)
BUN: 78 mg/dL — AB (ref 6–23)
CO2: 25 mmol/L (ref 19–32)
Calcium: 8.4 mg/dL (ref 8.4–10.5)
Chloride: 98 mmol/L (ref 96–112)
Creatinine, Ser: 4.4 mg/dL — ABNORMAL HIGH (ref 0.50–1.10)
GFR calc Af Amer: 12 mL/min — ABNORMAL LOW (ref >90)
GFR calc non Af Amer: 10 mL/min — ABNORMAL LOW (ref >90)
Glucose, Bld: 185 mg/dL — ABNORMAL HIGH (ref 70–99)
Potassium: 5.4 mmol/L — ABNORMAL HIGH (ref 3.5–5.1)
SODIUM: 134 mmol/L — AB (ref 135–145)

## 2014-06-26 LAB — CBC
HCT: 39.5 % (ref 36.0–46.0)
HEMOGLOBIN: 12.4 g/dL (ref 12.0–15.0)
MCH: 25.7 pg — ABNORMAL LOW (ref 26.0–34.0)
MCHC: 31.4 g/dL (ref 30.0–36.0)
MCV: 82 fL (ref 78.0–100.0)
Platelets: 255 10*3/uL (ref 150–400)
RBC: 4.82 MIL/uL (ref 3.87–5.11)
RDW: 18.9 % — AB (ref 11.5–15.5)
WBC: 8.2 10*3/uL (ref 4.0–10.5)

## 2014-06-26 LAB — MAGNESIUM: MAGNESIUM: 2 mg/dL (ref 1.5–2.5)

## 2014-06-26 MED ORDER — LIDOCAINE HCL (PF) 1 % IJ SOLN: 5.0000 mL | INTRAMUSCULAR | Status: DC | PRN

## 2014-06-26 MED ORDER — SODIUM CHLORIDE 0.9 % IV SOLN: 100.0000 mL | INTRAVENOUS | Status: DC | PRN

## 2014-06-26 MED ORDER — LIDOCAINE-PRILOCAINE 2.5-2.5 % EX CREA
1.0000 "application " | TOPICAL_CREAM | CUTANEOUS | Status: DC | PRN
  Filled 2014-06-26: qty 5

## 2014-06-26 MED ORDER — HEPARIN SODIUM (PORCINE) 1000 UNIT/ML DIALYSIS
1000.0000 [IU] | INTRAMUSCULAR | Status: DC | PRN
  Filled 2014-06-26: qty 1

## 2014-06-26 MED ORDER — PENTAFLUOROPROP-TETRAFLUOROETH EX AERO: 1.0000 "application " | INHALATION_SPRAY | CUTANEOUS | Status: DC | PRN

## 2014-06-26 MED ORDER — NEPRO/CARBSTEADY PO LIQD
237.0000 mL | ORAL | Status: DC | PRN
  Filled 2014-06-26: qty 237

## 2014-06-26 MED ORDER — HEPARIN SODIUM (PORCINE) 1000 UNIT/ML DIALYSIS
2000.0000 [IU] | INTRAMUSCULAR | Status: DC | PRN
  Filled 2014-06-26: qty 2

## 2014-06-26 MED ORDER — ALTEPLASE 2 MG IJ SOLR
2.0000 mg | Freq: Once | INTRAMUSCULAR | Status: AC | PRN
  Filled 2014-06-26: qty 2

## 2014-06-26 MED ORDER — DEXTROSE 5 % IV SOLN
1.5000 g | INTRAVENOUS | Status: AC
  Administered 2014-06-27: 1.5 g via INTRAVENOUS
  Filled 2014-06-26: qty 1.5

## 2014-06-26 NOTE — Progress Notes (Signed)
PT Cancellation Note  Patient Details Name: [name] MRN: [id number] DOB: [birth year]   Cancelled Treatment:    Reason Eval/Treat Not Completed: Patient at procedure or test/unavailable.. HD again today and can ck later.   Ivar DrapeStout, Guillermo Nehring E 06/26/2014, 10:24 AM   Samul Dadauth Anirudh Baiz, PT MS Acute Rehab Dept. Number: [phone]

## 2014-06-26 NOTE — Progress Notes (Signed)
Rehab admissions - Evaluated for possible admission.  I met with patient and gave her inpatient rehab booklets.  She does not know what she wants to do for her rehab.  I told her to talk about rehab with her family.  I will check back on Monday for progress and potential inpatient rehab admission plans.  Call me for questions.  #[phone]

## 2014-06-26 NOTE — Care Management Note (Signed)
    Page 1 of 2   06/30/2014     3:31:15 PM CARE MANAGEMENT NOTE 06/30/2014  Patient:  [name],[name]   Account Number:  [id number]  Date Initiated:  06/18/2014  Documentation initiated by:  DAVIS,Stephanie  Subjective/Objective Assessment:   resp. failure, acute renal failure, multiple system failure     Action/Plan:   [facility]   lives w fam, pcp dr [name]   Anticipated DC Date:  07/03/2014   Anticipated DC Plan:  LONG TERM ACUTE CARE (LTAC)  In-house referral  NA      DC Planning Services  CM consult      PAC Choice  NA   Choice offered to / List presented to:  NA           Status of service:  Completed, signed off Medicare Important Message given?  NO (If response is "NO", the following Medicare IM given date fields will be blank) Date Medicare IM given:   Medicare IM given by:   Date Additional Medicare IM given:   Additional Medicare IM given by:    Discharge Disposition:    Per UR Regulation:  Reviewed for med. necessity/level of care/duration of stay  If discussed at Long Length of Stay Meetings, dates discussed:    Comments:  06/29/14- 1500- Stephanie PieriniKristi Nicholes Hibler RN, BSN [phone] Spoke with [name] regarding LTACH option- MD feels like LTACH would be pt's [name] option at this time as pt is not tolerating HD- did speak with both [facility] and [facility] to ask them to take a look at pt for appropriateness- pt is self pay/medicaid pending- and [facility] will continue to explore this option- will discuss in [facility] mtg in am also.  [facility] is also following pt.  1602 06-26-14 Stephanie BambergerBrenda Graves-Bigelow, RN,BSN [phone] CM did speak with MD [name] in ref to disposition. CIR is looking at pt. CM did mention LTAC as a possibility. Pt new HD and planned for temporary cath and HD 06-27-14.Pt without insurance. Pt with SVT- not stable for d/[name] per MD. CM to continue to monitor for disposition needs.  06/24/14 1450  [name] Pt with bursts of SVT, cards adjusting meds.  TEE: severe RV dysfunction; severe RAE; severe TR; positive saline microcavitation study consistent with PFO; note patient with incessant atrial tachycardia during the procedure.  06/22/14 0800 [name] Transferred to 2C, lives with familyl, initiated on HD 3/17,  head CT showed acute infarct.  June 18, 2014/Stephanie L. Earlene Plateravis, RN, BSN, [name]. Case Management [facility] [phone] No discharge needs present of time of review.

## 2014-06-26 NOTE — Consult Note (Signed)
Vascular Surgery Consultation  Reason for Consult: Problems with left upper arm AV fistula  HPI: [name] is a 59 y.o. female who presents for evaluation of left upper arm AV fistula. Left brachial-cephalic AV fistula was created by Dr. Darrick Pennafields in October 2015. It has been utilized but has had some problems recently with infiltration. Was seen in the office by Dr. Myra GianottiBrabham about one month ago and ultrasound revealed one large competing branch. Patient had fistulogram performed on March 21 by IR. Earlier today unable to use fistula on hemodialysis.   Past Medical History  Diagnosis Date  . Asthma   . Hypertension   . Gout   . Arthritis   . Insomnia   . Chronic kidney disease   . Depression    Past Surgical History  Procedure Laterality Date  . Multiple tooth extractions    . Av fistula placement Left 01/28/2014    Procedure: ARTERIOVENOUS (AV) FISTULA CREATION;  Surgeon: Sherren Kernsharles E Fields, MD;  [facility];  Service: Vascular;  Laterality: Left;  . Tee without cardioversion N/A 06/23/2014    Procedure: TRANSESOPHAGEAL ECHOCARDIOGRAM (TEE);  Surgeon: Lewayne BuntingBrian S Crenshaw, MD;  [facility];  Service: Cardiovascular;  Laterality: N/A;   History   Social History  . Marital Status: Single    Spouse Name: N/A  . Number of Children: N/A  . Years of Education: N/A   Social History Main Topics  . Smoking status: Current Every Day Smoker -- 0.50 packs/day for 20 years    Types: Cigarettes  . Smokeless tobacco: Never Used  . Alcohol Use: Yes     Comment: occ  . Drug Use: No  . Sexual Activity: Not on file   Other Topics Concern  . None   Social History Narrative   Family History  Problem Relation Age of Onset  . Diabetes Mother   . Hypertension Mother   . Heart disease Mother   . Heart attack Mother   . Peripheral vascular disease Mother   . Diabetes Father   . Heart disease Father   . Hypertension Father   . Diabetes Sister   . Hypertension Sister   .  Heart disease Sister     before age 160  . Heart attack Sister   . Peripheral vascular disease Sister   . Heart disease Brother   . Hyperlipidemia Daughter    No Known Allergies Prior to Admission medications   Medication Sig Start Date End Date Taking? Authorizing Provider  allopurinol (ZYLOPRIM) 100 MG tablet Take 100 mg by mouth 2 (two) times daily as needed (for gout).   Yes Historical Provider, MD  citalopram (CELEXA) 20 MG tablet Take 20 mg by mouth daily.   Yes Historical Provider, MD  furosemide (LASIX) 80 MG tablet Take 80 mg by mouth daily.   Yes Historical Provider, MD  hydrOXYzine (ATARAX/VISTARIL) 25 MG tablet Take 25 mg by mouth 3 (three) times daily as needed for anxiety.   Yes Historical Provider, MD  oxyCODONE (ROXICODONE) 5 MG immediate release tablet Take 1 tablet (5 mg total) by mouth every 6 (six) hours as needed for severe pain. Patient not taking: Reported on 06/17/2014 01/28/14   Raymond GurneyKimberly A Trinh, PA-C     Positive ROS:   All other systems have been reviewed and were otherwise negative with the exception of those mentioned in the HPI and as above.  Physical Exam: Filed Vitals:   06/26/14 0830  BP: 138/99  Pulse: 88  Temp:  Resp: 20    General: Alert, no acute distress HEENT: Normal for age Cardiovascular: Regular rate and rhythm. Carotid pulses 2+, no bruits audible Respiratory: Clear to auscultation. No cyanosis, no use of accessory musculature GI: No organomegaly, abdomen is soft and non-tender Skin: No lesions in the area of chief complaint Neurologic: Sensation intact distally Psychiatric: Patient is competent for consent with normal mood and affect Musculoskeletal: No obvious deformities Extremities: Left upper extremity with excellent pulse and palpable thrill and brachial-cephalic AV fistula. There is some hyperpigmentation and thickening of skin overlying fistula. Well-perfused left hand with no evidence of steal.   Imaging reviewed:  Fistulogram from March 21 reviewed. There is focal aneurysmal area about 3 cm from arterial anastomosis which is significant and also large competing branch extending laterally.   Assessment/Plan:  Would recommend not using fistula the present time Will insert tunneled hemodialysis catheter tomorrow Will have Dr. fields review fistulogram next week and possibly patient will need revision.  discussed this with patient and she is agreeable for catheter insertion tomorrow   Josephina Gip, MD 06/26/2014 10:15 AM

## 2014-06-26 NOTE — Progress Notes (Signed)
Patient: [name] / Admit Date: 06/17/2014 / Date of Encounter: 06/26/2014, 12:05 PM   Subjective: Denies CP or SOB. Feels malaise today. When in SVT she feels a rushing sensation but is not significantly symptomatic  Objective: Telemetry: NSR with frequent brief episodes of PSVT Physical Exam: Blood pressure 155/100, pulse 92, temperature 98.1 F (36.7 C), temperature source Oral, resp. rate 20, height  (1.651 m), weight 222 lb 3.6 oz (100.8 kg), SpO2 98 %. General: Well developed chronically ill appearing F in no acute distress. Head: Normocephalic, atraumatic, sclera non-icteric, no xanthomas, nares are without discharge. Neck:  JVP not elevated. Lungs: Clear bilaterally to auscultation without wheezes, rales, or rhonchi. Breathing is unlabored. Heart: RRR S1 S2 without murmurs, rubs, or gallops.  Abdomen: Soft, non-tender, non-distended with normoactive bowel sounds. No rebound/guarding. Extremities: No clubbing or cyanosis. No edema. Distal pedal pulses are 2+ and equal bilaterally. Neuro: Alert and oriented X 3. Moves all extremities spontaneously. Psych:  Responds to questions appropriately with a normal affect.   Intake/Output Summary (Last 24 hours) at 06/26/14 1205 Last data filed at 06/26/14 9892  Gross per 24 hour  Intake    240 ml  Output     31 ml  Net    209 ml    Inpatient Medications:  . amLODipine  10 mg Oral Daily  . antiseptic oral rinse  7 mL Mouth Rinse BID  . aspirin  325 mg Oral Daily  . atorvastatin  10 mg Oral q1800  . calcium acetate  667 mg Oral TID WC  . [START ON 06/27/2014] cefUROXime (ZINACEF)  IV  1.5 g Intravenous On Call to OR  . citalopram  20 mg Oral Daily  . fenofibrate  160 mg Oral Daily  . ferric gluconate (FERRLECIT/NULECIT) IV  125 mg Intravenous Q M,W,F-HD  . heparin subcutaneous  5,000 Units Subcutaneous 3 times per day  . ipratropium-albuterol  3 mL Nebulization TID  . metoprolol tartrate  25 mg Oral BID  . multivitamin   1 tablet Oral QHS  . predniSONE  40 mg Oral BID WC   Infusions:    Labs:  Recent Labs  06/24/14 1334  06/24/14 1339 06/25/14 0309 06/26/14 0511 06/26/14 0731  NA  --   < > 139 137 134*  --   K  --   < > 4.7 5.2* 5.4*  --   CL  --   < > 101 100 98  --   CO2  --   < > --   GLUCOSE  --   < > 75 113* 185*  --   BUN  --   < > 75* 54* 78*  --   CREATININE  --   < > 4.33* 3.39* 4.40*  --   CALCIUM  --   < > 8.6 8.1* 8.4  --   MG 2.0  --   --   --   --  2.0  PHOS  --   --  5.8*  --   --   --   < > = values in this interval not displayed.  Recent Labs  06/24/14 1339  ALBUMIN 2.6*    Recent Labs  06/25/14 0309 06/26/14 0511  WBC 10.0 8.2  HGB 12.3 12.4  HCT 38.8 39.5  MCV 82.4 82.0  PLT 233 255   No results for input(s): CKTOTAL, CKMB, TROPONINI in the last 72 hours. Invalid input(s): POCBNP No results for input(s): HGBA1C  in the last 72 hours.   Radiology/Studies:  Dg Chest 2 View  06/17/2014   CLINICAL DATA:  Shortness of breath and chest tenderness for 1 week. Oxygen dependent.  EXAM: CHEST  2 VIEW  COMPARISON:  Chest radiograph 01/28/2014.  FINDINGS: Increasing cardiomegaly. No focal infiltrates or congestive failure. No effusion or pneumothorax. Bones unremarkable.  IMPRESSION: Increasing cardiomegaly.  No active infiltrates or failure.   Electronically Signed   By: Davonna Belling M.D.   On: 06/17/2014 17:18   Ct Head Wo Contrast  06/19/2014   CLINICAL DATA:  Left-sided weakness.  Dialysis patient.  EXAM: CT HEAD WITHOUT CONTRAST  TECHNIQUE: Contiguous axial images were obtained from the base of the skull through the vertex without intravenous contrast.  COMPARISON:  None  FINDINGS: Ill-defined hypodensity in the right medial frontal lobe consistent with acute infarct in the right anterior cerebral artery territory. No associated hemorrhage.  Negative for chronic ischemia. Ventricle size is normal. Negative for hemorrhage or mass lesion.  Calvarium intact.   Retention cyst in the left sphenoid sinus.  IMPRESSION: Acute infarct right anterior cerebral artery territory.  Critical Value/emergent results were called by telephone at the time of interpretation on 06/19/2014 at 12:53 pm to Dr. Billy Fischer , who verbally acknowledged these results.   Electronically Signed   By: Marlan Palau M.D.   On: 06/19/2014 12:53   Mr Maxine Glenn Head Wo Contrast  06/20/2014   CLINICAL DATA:  Shortness of breath and generalized weakness for 2 weeks. LEFT hemiparesis and LEFT facial droop of recent onset, but unspecified duration. History of chronic renal disease and hypertension. Acute respiratory failure. Abnormal CT head.  EXAM: MRI HEAD WITHOUT CONTRAST  MRA HEAD WITHOUT CONTRAST  TECHNIQUE: Multiplanar, multiecho pulse sequences of the brain and surrounding structures were obtained without intravenous contrast. Angiographic images of the head were obtained using MRA technique without contrast.  COMPARISON:  CT head performed 06/19/2014.  FINDINGS: MRI HEAD FINDINGS  Only a limited number of pulse sequences could be obtained on the MRI exam. Diffusion axial and T1 sagittal images are interpreted.  Multiple areas of restricted diffusion throughout both hemispheres are identified consistent with acute infarction. The largest confluent area involves the RIGHT medial parasagittal frontal and posterior frontal cortex and subcortical white matter extending as far posteriorly as the central sulcus. Other smaller areas of restricted diffusion representing acute infarction can be seen in the LEFT anterior frontal cortex, LEFT parietal parasagittal cortex, and the LEFT parietal subcortical white matter.  No gross evidence for hemorrhage, mass lesion, hydrocephalus, or extra-axial fluid based on axial DWI. No pituitary enlargement or tonsillar herniation. Upper cervical region unremarkable. No osseous lesions.  MRA HEAD FINDINGS  Dolichoectatic but widely patent internal carotid arteries.  Dolichoectatic and widely patent basilar artery. Both vertebrals contribute to basilar formation with the RIGHT dominant. There is no large vessel occlusion, proximal intracranial stenosis, or visible berry aneurysm. Moderately diseased anterior inferior cerebellar arteries bilaterally. No RIGHT PICA is visualized.  The distal RIGHT anterior cerebral artery in its pericallosal segment appears moderately diseased corresponding to the observed RIGHT ACA territory infarct.  IMPRESSION: Multifocal areas of acute infarction throughout both hemispheres, with the largest confluent area involving the RIGHT medial parasagittal frontal and posterior frontal cortex, distal ACA territory.  A full MRI examination could not be completed, therefore comprehensive assessment of intracranial abnormalities, including characterization of the observed acute infarcts, is not possible.  No proximal large vessel occlusion on MRA intracranial exam.   Electronically  Signed   By: Davonna Belling M.D.   On: 06/20/2014 17:11   Mr Brain Wo Contrast  06/20/2014   CLINICAL DATA:  Shortness of breath and generalized weakness for 2 weeks. LEFT hemiparesis and LEFT facial droop of recent onset, but unspecified duration. History of chronic renal disease and hypertension. Acute respiratory failure. Abnormal CT head.  EXAM: MRI HEAD WITHOUT CONTRAST  MRA HEAD WITHOUT CONTRAST  TECHNIQUE: Multiplanar, multiecho pulse sequences of the brain and surrounding structures were obtained without intravenous contrast. Angiographic images of the head were obtained using MRA technique without contrast.  COMPARISON:  CT head performed 06/19/2014.  FINDINGS: MRI HEAD FINDINGS  Only a limited number of pulse sequences could be obtained on the MRI exam. Diffusion axial and T1 sagittal images are interpreted.  Multiple areas of restricted diffusion throughout both hemispheres are identified consistent with acute infarction. The largest confluent area involves the RIGHT  medial parasagittal frontal and posterior frontal cortex and subcortical white matter extending as far posteriorly as the central sulcus. Other smaller areas of restricted diffusion representing acute infarction can be seen in the LEFT anterior frontal cortex, LEFT parietal parasagittal cortex, and the LEFT parietal subcortical white matter.  No gross evidence for hemorrhage, mass lesion, hydrocephalus, or extra-axial fluid based on axial DWI. No pituitary enlargement or tonsillar herniation. Upper cervical region unremarkable. No osseous lesions.  MRA HEAD FINDINGS  Dolichoectatic but widely patent internal carotid arteries. Dolichoectatic and widely patent basilar artery. Both vertebrals contribute to basilar formation with the RIGHT dominant. There is no large vessel occlusion, proximal intracranial stenosis, or visible berry aneurysm. Moderately diseased anterior inferior cerebellar arteries bilaterally. No RIGHT PICA is visualized.  The distal RIGHT anterior cerebral artery in its pericallosal segment appears moderately diseased corresponding to the observed RIGHT ACA territory infarct.  IMPRESSION: Multifocal areas of acute infarction throughout both hemispheres, with the largest confluent area involving the RIGHT medial parasagittal frontal and posterior frontal cortex, distal ACA territory.  A full MRI examination could not be completed, therefore comprehensive assessment of intracranial abnormalities, including characterization of the observed acute infarcts, is not possible.  No proximal large vessel occlusion on MRA intracranial exam.   Electronically Signed   By: Davonna Belling M.D.   On: 06/20/2014 17:11   US Renal  06/18/2014   CLINICAL DATA:  Patient with acute renal failure.  EXAM: RENAL/URINARY TRACT ULTRASOUND COMPLETE  COMPARISON:  None.  FINDINGS: Right Kidney:  Length: 8.4 cm. No hydronephrosis. Renal cortex is diffusely increased in echogenicity.  Left Kidney:  Length: 9.1 cm. There is a 1.9 x  1.7 x 1.5 cm hypoechoic mass off of the interpolar region of the left kidney.  Bladder:  Poorly distended.  Ascites within the pelvis.  IMPRESSION: Indeterminate 1.9 cm hypoechoic mass off the interpolar region of the left kidney. Evaluation is markedly limited due to body habitus. This may potentially represent a cyst however solid mass is not excluded. Recommend follow-up in the outpatient setting with either pre and post enhanced CT or MRI.  Echogenic right kidney most compatible with chronic medical renal disease.  No hydronephrosis.  Fluid within the pelvis.  These results will be called to the ordering clinician or representative by the Radiologist Assistant, and communication documented in the PACS or zVision Dashboard.   Electronically Signed   By: Annia Belt M.D.   On: 06/18/2014 13:20   Dg Chest Port 1 View  06/20/2014   CLINICAL DATA:  Hypoxia  EXAM: PORTABLE CHEST -  1 VIEW  COMPARISON:  06/17/2014  FINDINGS: Stable mild cardiomegaly. No mediastinal or hilar masses or evidence of adenopathy.  Clear lungs.  No pleural effusion or pneumothorax.  IMPRESSION: No acute cardiopulmonary disease. Stable appearance from the prior exam.   Electronically Signed   By: Amie Portlandavid  Ormond M.D.   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  EXAM: DIALYSIS AV FISTULOGRAM  COMPARISON:  None.  CONTRAST:  50mL OMNIPAQUE IOHEXOL 300 MG/ML  SOLN  FLUOROSCOPY TIME:  24 seconds.  PROCEDURE: The procedure, risks, benefits, and alternatives were explained to the patient. Questions regarding the procedure were encouraged and answered. The patient understands and consents to the procedure.  The left arm native fistula was prepped with Betadine in a sterile fashion, and a sterile drape was applied covering the operative field. A diagnostic fistulogram was performed via an 18 gauge angiocatheter introduced  into venous outflow. Venous drainage was assessed to the level of the central veins in the chest. Proximal fistula was studied by reflux maneuver with temporary compression of venous outflow. After the procedure, the angiocatheter was removed and hemostasis obtained with manual compression.  COMPLICATIONS: None  FINDINGS: Antecubital anastomosis between the brachial artery and cephalic vein is normally patent. After a short segment, there is focal aneurysmal dilatation of the vein in the antecubital fossa. There is competing venous outflow noted in the cephalic vein as well as the deep venous system. The cephalic vein is tortuous but shows no significant focal stenoses. Central veins are normally patent, including the SVC.  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 There is competing venous outflow in the deep venous system of the upper arm.  ACCESS: No percutaneous intervention was indicated today.   Electronically Signed   By: Irish LackGlenn  Yamagata M.D.   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 Cardiology consulted for question atrial fib with telemetry showing PSVT (question PAT). No definite atrial fib/flutter seen.    1. 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 Stroke noted on 06/19/14 - Small PFO noted by echo, no DVT/SVT by dopplers 06/18/14  - TEE 06/23/14: no LAA thrombus, severe RAE; mild RVE; severely reduced RV function; severe TR; bowing of atrial septum right to left; positive saline microcavitation study - EP did not think loop was indicated but instead feels 30-day monitor will be sufficient at discharge to assess for AF given high pretest probability in light of other arrhythmia  3. Elevated troponin without chest pain - peak 1.13 on admission - will need ischemic evaluation at some point - cardiac risk factors include HTN, HLD, newly diagnosed diabetes - anticipate outpatient Lexiscan nuclear stress  test once recovered from acute illness and stroke  3. 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  I have reviewed the chart, notes and new data.  I agree with PA's note.  Key new complaints: she seems mostly unaware of the arrhythmia Key examination changes: episodes of brief paroxysmal atrial tachycardia (long RP, distinct negative P wave in inferior leads) with increased frequency today Key new findings / data: troponin 1 on admission during critical illness - doubt true ACS  PLAN: No good antiarrhythmic option (other than amiodarone) and arrhythmia is not particularly symptomatic - will continue with beta blocker for now but may ask EP opinion again if episodes are more persistent. Outpatient Lexiscan Myoview when acute illness resolves. No atrial fib detected yet, but strong suspicion we may detect this, in which case will need anticoagulation.  Thurmon Fair, MD, [facility] [phone] 06/26/2014, 2:14 PM

## 2014-06-26 NOTE — Progress Notes (Signed)
[facility] Progress Note  Assessment/Plan: 1. PSVT - continues with intermittent runs - on BB and asa 2. CVA/cryptogenic stroke - acute ACA, left hemiparesis; neuro following - seen by EP = rec 30 day event recorder to arrange at d/c and outpt sleep study 3. ESRD - new start - problems with AVF again - had fistulagram 3/21 without intervention - showed focal aneurysmal dilitation and competing venous outflow in the deep venous system - will ask VVS to evaluate - only ran only 1.25 hr  - higher venous pressures - bruising from prior problems - access somewhat deep/tortuous K 5.4 today pre HD. Bleeding issues with access last treatment - none today. 4. Anemia - Hgb 12 no need for ESA - repleting Fe 5. Secondary hyperparathyroidism - iPTH 338 - 3/21, 531 3/18 - not on vit D yet 6. HTN/volume - norvasc 10 and metoprolol 25 bid - may have to go up on MTP 7. Nutrition - alb 2.6 + vit 8. COPD/tobacco 9. S/p tx for Infuenza A/HIN1 10. Severe R HF per TEE  Sheffield SliderMartha B Bergman, PA-C [facility] Beeper [phone] 06/26/2014,8:33 AM  LOS: 9 days   Pt seen, examined and agree w A/P as above.  Vinson Moselleob Kendall Justo MD pager [phone]    cell [phone] 06/26/2014, 12:18 PM    Subjective:   Left arm hurts  Objective Filed Vitals:   06/26/14 0642 06/26/14 0700 06/26/14 0715 06/26/14 0730  BP: 146/105 138/100 141/107 144/109  Pulse: 88 132 93 90  Temp:      TempSrc:      Resp: 21 21 20 22   Height:      Weight:      SpO2: 98% 100% 100% 100%   Physical Exam General: supine on HD Heart: tachy reg Lungs: no rales Abdomen: obese soft Extremities: Tr LE edema Dialysis Access:  Left upper AVF + bruising + bruit mild swelling  Dialysis Orders: none - new start  Additional Objective Labs: Basic Metabolic Panel:  Recent Labs Lab 06/20/14 0850  06/24/14 1339 06/25/14 0309 06/26/14 0511  NA 134*  < > 139 137 134*  K 3.8  < > 4.7 5.2* 5.4*  CL 95*  < > 101 100 98   CO2 25  < > 27 25 25   GLUCOSE 125*  < > 75 113* 185*  BUN 45*  < > 75* 54* 78*  CREATININE 5.77*  < > 4.33* 3.39* 4.40*  CALCIUM 7.4*  < > 8.6 8.1* 8.4  PHOS 6.5*  --  5.8*  --   --   < > = values in this interval not displayed. Liver Function Tests:  Recent Labs Lab 06/20/14 0850 06/24/14 1339  AST 89*  --   ALT 148*  --   ALKPHOS 151*  --   BILITOT 0.8  --   PROT 6.4  --   ALBUMIN 2.5* 2.6*   CBC:  Recent Labs Lab 06/20/14 0850 06/22/14 0405 06/24/14 1338 06/25/14 0309 06/26/14 0511  WBC 13.9* 9.7 11.9* 10.0 8.2  HGB 12.3 12.5 12.5 12.3 12.4  HCT 38.2 39.0 38.8 38.8 39.5  MCV 80.4 79.8 80.7 82.4 82.0  PLT 289 319 299 233 255  Cardiac Enzymes:  Recent Labs Lab 06/20/14 2037  TROPONINI 0.68*   CBG:  Recent Labs Lab 06/22/14 2222 06/23/14 0825 06/23/14 1301 06/23/14 1659 06/24/14 0821  GLUCAP 143* 98 101* 121* 70  Medications:   . amLODipine  10 mg Oral Daily  . antiseptic oral  rinse  7 mL Mouth Rinse BID  . aspirin  325 mg Oral Daily  . atorvastatin  10 mg Oral q1800  . calcium acetate  667 mg Oral TID WC  . citalopram  20 mg Oral Daily  . fenofibrate  160 mg Oral Daily  . ferric gluconate (FERRLECIT/NULECIT) IV  125 mg Intravenous Q M,W,F-HD  . heparin subcutaneous  5,000 Units Subcutaneous 3 times per day  . ipratropium-albuterol  3 mL Nebulization TID  . metoprolol tartrate  25 mg Oral BID  . multivitamin  1 tablet Oral QHS  . predniSONE  40 mg Oral BID WC

## 2014-06-26 NOTE — Progress Notes (Signed)
Pt has had intermittent runs of SVT, highest seen was 153. Drops back on own into 100s and less. Resting in bed, feels no change with elevated HR, no pain. Will continue to monitor

## 2014-06-26 NOTE — Progress Notes (Signed)
Ran 1hr 15min of a 4 hour hemodialysis Tx. Had frequent runs of SVT during the procedure. EKG done. Arm was bruised and swollen prior to being stuck. Venous pressures began climbing during the Tx and swelling and hardness at venous site was getting worse. Tx aborted and blood rinsed back via the arterial needle. Bard HerbertMarty Bergman, PA aware. Ice applied to site.

## 2014-06-26 NOTE — Progress Notes (Signed)
Inpatient Diabetes Program Recommendations  AACE/ADA: New Consensus Statement on Inpatient Glycemic Control (2013)  Target Ranges:  Prepandial:   less than 140 mg/dL      Peak postprandial:   less than 180 mg/dL (1-2 hours)      Critically ill patients:  140 - 180 mg/dL   Diabetes history: None  Inpatient Diabetes Program Recommendations  HgbA1C: A1c 6.5% on 06/20/14. Hgb at that time 12.3. Patient has a family hx of DM. Per ADA, 6.5 % meets criteria for new diagnosis of DM 2. If patient is to be newly diagnosed with DM, please place consult for Dietitian and DM coordinator for lifestyle factor management.  Thanks,  Christena DeemShannon Royalty Domagala RN, MSN, [facility] Inpatient Diabetes Coordinator Team Pager [phone]

## 2014-06-26 NOTE — Progress Notes (Signed)
[facility]                                                                              Patient Demographics  [name], is a 59 y.o. female, DOB - 1955-07-05, ZOX:[id number]  Admit date - 06/17/2014   Admitting Physician Lupita Leash, MD  Outpatient Primary MD for the patient is August Saucer, [name], MD  LOS - 9   Chief Complaint  Patient presents with  . Shortness of Breath  . Asthma  . Cough      HPI on Dr. Cyril Mourning (PCCM) on 06/17/2014 59 y/o female with CKD and hypertension came to the [facility] ED on 3/16 complaining of shortness of breath and generalized weakness x 2 weeks. Worse with exhertion, also described chest tightness during these times. She noted not taking her diuretic medications for majority of that time period. RLE edema was "bad" but has now resolved. She denied fever but noted some mild chills. In the ED she was noted to be hypoxemic and in what is believed to be acute on chronic renal failure. She was given lasix and renal and critical care medicine were consulted. Currently she feels much better and is breathing comfortably on 100% NRB.   Interim history Patient admitted to [facility] service and found to have uncompensated metabolic acidosis from worsening uremia, H1N1 flu. Renal ultrasound done noted mass off of left kidney. Patient transferred to [facility] for dialysis which greatly improved her breathing. On 3/18 patient noted to be hemiparetic on the left side and a CT scan noted an infarct of the right ACA and neurology consulted. Patient since stabilized and transferred to step down unit to hospitalist service. Patient's MRI notes focal areas of acute infarction throughout both hemispheres, consistent with an embolic phenomenon. Patient underwent stroke workup which had noted a negative TEE for clot, negative carotid Dopplers although she has had episodes of a flutter. She will likely need chronic long-term anticoagulation. Breathing continues to improve  and patient is down to approximately 3 L. She has continued to receive dialysis, followed by nephrology  Assessment & Plan   Acute respiratory failure with hypoxia -Patient continues to need nasal cannula, approximately 3 L -Chest x-ray: no acute cardiopulmonary disease -Likely multifactorial including influenza, chronic tobacco use with underlying COPD/volume overload  COPD with acute exacerbation -Possibly due to cor pulmonale -Continue steroids, taper down -Continue nebulizers, incentive spirometry   Acute CVA -MRI/MRA: Multifocal areas of acute infarction throughout both hemispheres, largest confluent area involving the right medial parasagittal frontal and posterior frontal cortex, distal ACA territory -TEE: Normal LV function, no LAA thrombus,severe RAE; mild RVE; severely reduced RV function; severe TR; bowing of atrial septum right to left; positive saline microcavitation study- PFO -Carotid Doppler: Bilateral 1-39% ICA stenosis, vertebral artery flow is antegrade -Lower extremity Doppler showed no SVT or DVT in bilateral lower extremities, Doppler waveforms are pulsatile suggestive of fluid overload -Echocardiogram: EF 60-65%, grade 1 diastolic dysfunction, severely elevated pulmonary pressure -LDL 77, hemoglobin A1c 6.5 -Neurology consulted and appreciated -Hypercoagulable workup: negative lupus anticoagulant antiphospholipid antibody -Continue aspirin-full dose, statin -Possible need for loop recorder vs 30day event monitor, which will be arranged for by cardiology -  PT and OT consulted and recommended CIR -CIR consulted and pending approval  Atrial flutter/paroxysmal SVT -Appears in SR at this time -Patient had a run of SVT overnight -Cardiology consulted and appreciated- recommended 30 day event monitor, which will be arranged at discharge -Continue continue metoprolol -Patient does have severe RV and RA dysfunction and enlargement on echo- making her more likely to have  Afib and other arrhythmias -Patient will likely need outpatient sleep study  Chronic diastolic heart failure -Echocardiogram: EF 60-65%, grade 1 diastolic dysfunction -Continue to monitor intake, daily weights, hemodialysis  Hypertension -Continue amlodipine, metoprolol  End-stage renal disease on hemodialysis -Nephrology consulted and appreciated -Fistulogram conducted showing dilatation of the cephalic vein beyond the arterial anastomosis -Patient unable to fully dialyze for infiltration -Vascular surgery consulted and recommended not using fistula, possible fistulogram next week with possible revision  Left renal mass -Found on renal ultrasound 06/18/2014, intermediate 1.9 cm hypoechoic mass of the interpolar region of the left kidney -Patient will need renal MRI  Influenza, H1N1 -Patient completed course of Tamiflu 06/22/2014  Hyperlipidemia -Lipid profile: TC 156, TG 294, HDL 20, LDL 77 -Continue statin and fenofibrate, will monitor for signs of myalgia  Tobacco abuse -Smoking cessation counseling given  Depression -Continue Celexa  Obesity -BMI greater than 30 -Patient with primary care physician regarding lifestyle modifications upon discharge  Code Status: Full  Family Communication: Daughter at bedside  Disposition Plan: Admitted  Time Spent in minutes   30 minutes  Procedures  Echocardiogram TEE Carotid Doppler Renal ultrasound Lower extremity Doppler  Consults   PCCM Cardiology Neurology Nephrology Inpatient rehab Vascular surgery  DVT Prophylaxis  heparin  Lab Results  Component Value Date   PLT 255 06/26/2014    Medications  Scheduled Meds: . amLODipine  10 mg Oral Daily  . antiseptic oral rinse  7 mL Mouth Rinse BID  . aspirin  325 mg Oral Daily  . atorvastatin  10 mg Oral q1800  . calcium acetate  667 mg Oral TID WC  . [START ON 06/27/2014] cefUROXime (ZINACEF)  IV  1.5 g Intravenous On Call to OR  . citalopram  20 mg Oral Daily   . fenofibrate  160 mg Oral Daily  . ferric gluconate (FERRLECIT/NULECIT) IV  125 mg Intravenous Q M,W,F-HD  . heparin subcutaneous  5,000 Units Subcutaneous 3 times per day  . ipratropium-albuterol  3 mL Nebulization TID  . metoprolol tartrate  25 mg Oral BID  . multivitamin  1 tablet Oral QHS  . predniSONE  40 mg Oral BID WC   Continuous Infusions:  PRN Meds:.sodium chloride, sodium chloride, acetaminophen, albuterol, allopurinol, alteplase, feeding supplement (NEPRO CARB STEADY), heparin, [START ON 06/27/2014] heparin, hydrOXYzine, lidocaine (PF), lidocaine-prilocaine, metoprolol, ondansetron (ZOFRAN) IV, oxyCODONE, pentafluoroprop-tetrafluoroeth  Antibiotics    Anti-infectives    Start     Dose/Rate Route Frequency Ordered Stop   06/27/14 0600  cefUROXime (ZINACEF) 1.5 g in dextrose 5 % 50 mL IVPB     1.5 g 100 mL/hr over 30 Minutes Intravenous On call to O.R. 06/26/14 1019 06/28/14 0559   06/19/14 1800  oseltamivir (TAMIFLU) capsule 30 mg     30 mg Oral Every M-W-F (1800) 06/19/14 1003 06/22/14 1841   06/18/14 1600  oseltamivir (TAMIFLU) capsule 30 mg  Status:  Discontinued     30 mg Oral Daily 06/18/14 1538 06/19/14 1003        Subjective:   [name] seen and examined today.  Patient denies any pain at this time.  Feels her breathing has improved.  She denies chest pain, abdominal pain, nausea, vomiting, diarrhea.   Objective:   Filed Vitals:   06/26/14 0812 06/26/14 0830 06/26/14 1035 06/26/14 1100  BP: 134/104 138/99 155/100   Pulse: 87 88 92   Temp: 98.1 F (36.7 C)     TempSrc: Oral     Resp: 18 20    Height:      Weight: 100.8 kg (222 lb 3.6 oz)     SpO2: 100%   98%    Wt Readings from Last 3 Encounters:  06/26/14 100.8 kg (222 lb 3.6 oz)  06/01/14 102.059 kg (225 lb)  01/28/14 104.327 kg (230 lb)     Intake/Output Summary (Last 24 hours) at 06/26/14 1325 Last data filed at 06/26/14 7829  Gross per 24 hour  Intake    240 ml  Output     31 ml  Net     209 ml    Exam  General: Well developed, well nourished, NAD  HEENT: NCAT, mucous membranes moist.   Cardiovascular: S1 S2 auscultated, no murmur, RRR  Respiratory: Clear to auscultation   Abdomen: Soft, obese, nontender, nondistended, + bowel sounds  Extremities: warm dry without cyanosis clubbing. Trace LE edema. LUE AVF  Data Review    Micro Results Recent Results (from the past 240 hour(s))  MRSA PCR Screening     Status: None   Collection Time: 06/17/14  9:29 PM  Result Value Ref Range Status   MRSA by PCR NEGATIVE NEGATIVE Final    Comment:        The GeneXpert MRSA Assay (FDA approved for NASAL specimens only), is one component of a comprehensive MRSA colonization surveillance program. It is not intended to diagnose MRSA infection nor to guide or monitor treatment for MRSA infections.   Clostridium Difficile by PCR     Status: None   Collection Time: 06/18/14  3:52 AM  Result Value Ref Range Status   C difficile by pcr NEGATIVE NEGATIVE Final    Radiology Reports Dg Chest 2 View  06/17/2014   CLINICAL DATA:  Shortness of breath and chest tenderness for 1 week. Oxygen dependent.  EXAM: CHEST  2 VIEW  COMPARISON:  Chest radiograph 01/28/2014.  FINDINGS: Increasing cardiomegaly. No focal infiltrates or congestive failure. No effusion or pneumothorax. Bones unremarkable.  IMPRESSION: Increasing cardiomegaly.  No active infiltrates or failure.   Electronically Signed   By: Davonna Belling M.D.   On: 06/17/2014 17:18   Ct Head Wo Contrast  06/19/2014   CLINICAL DATA:  Left-sided weakness.  Dialysis patient.  EXAM: CT HEAD WITHOUT CONTRAST  TECHNIQUE: Contiguous axial images were obtained from the base of the skull through the vertex without intravenous contrast.  COMPARISON:  None  FINDINGS: Ill-defined hypodensity in the right medial frontal lobe consistent with acute infarct in the right anterior cerebral artery territory. No associated hemorrhage.  Negative for  chronic ischemia. Ventricle size is normal. Negative for hemorrhage or mass lesion.  Calvarium intact.  Retention cyst in the left sphenoid sinus.  IMPRESSION: Acute infarct right anterior cerebral artery territory.  Critical Value/emergent results were called by telephone at the time of interpretation on 06/19/2014 at 12:53 pm to Dr. Billy Fischer , who verbally acknowledged these results.   Electronically Signed   By: Marlan Palau M.D.   On: 06/19/2014 12:53   Mr Maxine Glenn Head Wo Contrast  06/20/2014   CLINICAL DATA:  Shortness of breath and generalized weakness for 2  weeks. LEFT hemiparesis and LEFT facial droop of recent onset, but unspecified duration. History of chronic renal disease and hypertension. Acute respiratory failure. Abnormal CT head.  EXAM: MRI HEAD WITHOUT CONTRAST  MRA HEAD WITHOUT CONTRAST  TECHNIQUE: Multiplanar, multiecho pulse sequences of the brain and surrounding structures were obtained without intravenous contrast. Angiographic images of the head were obtained using MRA technique without contrast.  COMPARISON:  CT head performed 06/19/2014.  FINDINGS: MRI HEAD FINDINGS  Only a limited number of pulse sequences could be obtained on the MRI exam. Diffusion axial and T1 sagittal images are interpreted.  Multiple areas of restricted diffusion throughout both hemispheres are identified consistent with acute infarction. The largest confluent area involves the RIGHT medial parasagittal frontal and posterior frontal cortex and subcortical white matter extending as far posteriorly as the central sulcus. Other smaller areas of restricted diffusion representing acute infarction can be seen in the LEFT anterior frontal cortex, LEFT parietal parasagittal cortex, and the LEFT parietal subcortical white matter.  No gross evidence for hemorrhage, mass lesion, hydrocephalus, or extra-axial fluid based on axial DWI. No pituitary enlargement or tonsillar herniation. Upper cervical region unremarkable. No  osseous lesions.  MRA HEAD FINDINGS  Dolichoectatic but widely patent internal carotid arteries. Dolichoectatic and widely patent basilar artery. Both vertebrals contribute to basilar formation with the RIGHT dominant. There is no large vessel occlusion, proximal intracranial stenosis, or visible berry aneurysm. Moderately diseased anterior inferior cerebellar arteries bilaterally. No RIGHT PICA is visualized.  The distal RIGHT anterior cerebral artery in its pericallosal segment appears moderately diseased corresponding to the observed RIGHT ACA territory infarct.  IMPRESSION: Multifocal areas of acute infarction throughout both hemispheres, with the largest confluent area involving the RIGHT medial parasagittal frontal and posterior frontal cortex, distal ACA territory.  A full MRI examination could not be completed, therefore comprehensive assessment of intracranial abnormalities, including characterization of the observed acute infarcts, is not possible.  No proximal large vessel occlusion on MRA intracranial exam.   Electronically Signed   By: Davonna Belling M.D.   On: 06/20/2014 17:11   Mr Brain Wo Contrast  06/20/2014   CLINICAL DATA:  Shortness of breath and generalized weakness for 2 weeks. LEFT hemiparesis and LEFT facial droop of recent onset, but unspecified duration. History of chronic renal disease and hypertension. Acute respiratory failure. Abnormal CT head.  EXAM: MRI HEAD WITHOUT CONTRAST  MRA HEAD WITHOUT CONTRAST  TECHNIQUE: Multiplanar, multiecho pulse sequences of the brain and surrounding structures were obtained without intravenous contrast. Angiographic images of the head were obtained using MRA technique without contrast.  COMPARISON:  CT head performed 06/19/2014.  FINDINGS: MRI HEAD FINDINGS  Only a limited number of pulse sequences could be obtained on the MRI exam. Diffusion axial and T1 sagittal images are interpreted.  Multiple areas of restricted diffusion throughout both  hemispheres are identified consistent with acute infarction. The largest confluent area involves the RIGHT medial parasagittal frontal and posterior frontal cortex and subcortical white matter extending as far posteriorly as the central sulcus. Other smaller areas of restricted diffusion representing acute infarction can be seen in the LEFT anterior frontal cortex, LEFT parietal parasagittal cortex, and the LEFT parietal subcortical white matter.  No gross evidence for hemorrhage, mass lesion, hydrocephalus, or extra-axial fluid based on axial DWI. No pituitary enlargement or tonsillar herniation. Upper cervical region unremarkable. No osseous lesions.  MRA HEAD FINDINGS  Dolichoectatic but widely patent internal carotid arteries. Dolichoectatic and widely patent basilar artery. Both vertebrals contribute to basilar  formation with the RIGHT dominant. There is no large vessel occlusion, proximal intracranial stenosis, or visible berry aneurysm. Moderately diseased anterior inferior cerebellar arteries bilaterally. No RIGHT PICA is visualized.  The distal RIGHT anterior cerebral artery in its pericallosal segment appears moderately diseased corresponding to the observed RIGHT ACA territory infarct.  IMPRESSION: Multifocal areas of acute infarction throughout both hemispheres, with the largest confluent area involving the RIGHT medial parasagittal frontal and posterior frontal cortex, distal ACA territory.  A full MRI examination could not be completed, therefore comprehensive assessment of intracranial abnormalities, including characterization of the observed acute infarcts, is not possible.  No proximal large vessel occlusion on MRA intracranial exam.   Electronically Signed   By: Davonna Belling M.D.   On: 06/20/2014 17:11   US Renal  06/18/2014   CLINICAL DATA:  Patient with acute renal failure.  EXAM: RENAL/URINARY TRACT ULTRASOUND COMPLETE  COMPARISON:  None.  FINDINGS: Right Kidney:  Length: 8.4 cm. No  hydronephrosis. Renal cortex is diffusely increased in echogenicity.  Left Kidney:  Length: 9.1 cm. There is a 1.9 x 1.7 x 1.5 cm hypoechoic mass off of the interpolar region of the left kidney.  Bladder:  Poorly distended.  Ascites within the pelvis.  IMPRESSION: Indeterminate 1.9 cm hypoechoic mass off the interpolar region of the left kidney. Evaluation is markedly limited due to body habitus. This may potentially represent a cyst however solid mass is not excluded. Recommend follow-up in the outpatient setting with either pre and post enhanced CT or MRI.  Echogenic right kidney most compatible with chronic medical renal disease.  No hydronephrosis.  Fluid within the pelvis.  These results will be called to the ordering clinician or representative by the Radiologist Assistant, and communication documented in the PACS or zVision Dashboard.   Electronically Signed   By: Annia Belt M.D.   On: 06/18/2014 13:20   Dg Chest Port 1 View  06/20/2014   CLINICAL DATA:  Hypoxia  EXAM: PORTABLE CHEST - 1 VIEW  COMPARISON:  06/17/2014  FINDINGS: Stable mild cardiomegaly. No mediastinal or hilar masses or evidence of adenopathy.  Clear lungs.  No pleural effusion or pneumothorax.  IMPRESSION: No acute cardiopulmonary disease. Stable appearance from the prior exam.   Electronically Signed   By: Amie Portland M.D.   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  EXAM: DIALYSIS AV FISTULOGRAM  COMPARISON:  None.  CONTRAST:  50mL OMNIPAQUE IOHEXOL 300 MG/ML  SOLN  FLUOROSCOPY TIME:  24 seconds.  PROCEDURE: The procedure, risks, benefits, and alternatives were explained to the patient. Questions regarding the procedure were encouraged and answered. The patient understands and consents to the procedure.  The left arm native fistula was prepped with Betadine in a sterile fashion, and a sterile drape was  applied covering the operative field. A diagnostic fistulogram was performed via an 18 gauge angiocatheter introduced into venous outflow. Venous drainage was assessed to the level of the central veins in the chest. Proximal fistula was studied by reflux maneuver with temporary compression of venous outflow. After the procedure, the angiocatheter was removed and hemostasis obtained with manual compression.  COMPLICATIONS: None  FINDINGS: Antecubital anastomosis between the brachial artery and cephalic vein is normally patent. After a short segment, there is focal aneurysmal dilatation of the vein in the antecubital fossa. There is competing venous outflow noted in the cephalic  vein as well as the deep venous system. The cephalic vein is tortuous but shows no significant focal stenoses. Central veins are normally patent, including the SVC.  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 There is competing venous outflow in the deep venous system of the upper arm.  ACCESS: No percutaneous intervention was indicated today.   Electronically Signed   By: Irish LackGlenn  Yamagata M.D.   On: 06/22/2014 13:20    CBC  Recent Labs Lab 06/20/14 0850 06/22/14 0405 06/24/14 1338 06/25/14 0309 06/26/14 0511  WBC 13.9* 9.7 11.9* 10.0 8.2  HGB 12.3 12.5 12.5 12.3 12.4  HCT 38.2 39.0 38.8 38.8 39.5  PLT 289 319 299 233 255  MCV 80.4 79.8 80.7 82.4 82.0  MCH 25.9* 25.6* 26.0 26.1 25.7*  MCHC 32.2 32.1 32.2 31.7 31.4  RDW 19.0* 18.8* 18.7* 18.8* 18.9*    Chemistries   Recent Labs Lab 06/20/14 0850 06/22/14 0405 06/24/14 1334 06/24/14 1339 06/25/14 0309 06/26/14 0511 06/26/14 0731  NA 134* 138  --  139 137 134*  --   K 3.8 3.7  --  4.7 5.2* 5.4*  --   CL 95* 98  --  101 100 98  --   CO2 25 26  --  27 25 25   --   GLUCOSE 125* 135*  --  75 113* 185*  --   BUN 45* 64*  --  75* 54* 78*  --   CREATININE 5.77*  5.63*  --  4.33* 3.39* 4.40*  --   CALCIUM 7.4* 8.8  --  8.6 8.1* 8.4  --   MG  --   --  2.0  --   --   --  2.0  AST 89*  --   --   --   --   --   --   ALT 148*  --   --   --   --   --   --   ALKPHOS 151*  --   --   --   --   --   --   BILITOT 0.8  --   --   --   --   --   --    ------------------------------------------------------------------------------------------------------------------ estimated creatinine clearance is 16.4 mL/min (by C-G formula based on Cr of 4.4). ------------------------------------------------------------------------------------------------------------------ No results for input(s): HGBA1C in the last 72 hours. ------------------------------------------------------------------------------------------------------------------ No results for input(s): CHOL, HDL, LDLCALC, TRIG, CHOLHDL, LDLDIRECT in the last 72 hours. ------------------------------------------------------------------------------------------------------------------ No results for input(s): TSH, T4TOTAL, T3FREE, THYROIDAB in the last 72 hours.  Invalid input(s): FREET3 ------------------------------------------------------------------------------------------------------------------ No results for input(s): VITAMINB12, FOLATE, FERRITIN, TIBC, IRON, RETICCTPCT in the last 72 hours.  Coagulation profile No results for input(s): INR, PROTIME in the last 168 hours.  No results for input(s): DDIMER in the last 72 hours.  Cardiac Enzymes  Recent Labs Lab 06/20/14 2037  TROPONINI 0.68*   ------------------------------------------------------------------------------------------------------------------ Invalid input(s): POCBNP    Wai Litt D.O. on 06/26/2014 at 1:25 PM  Between 7am to 7pm - Pager - [phone]  After 7pm go to [email or web address] - password [email or web address]  And look for the night coverage person covering for me after hours  [facility] Office  [phone]

## 2014-06-26 NOTE — Progress Notes (Signed)
Occupational Therapy Treatment Patient Details Name: [name] MRN: [id number] DOB: October 14, 1955 Today's Date: 06/26/2014    History of present illness Stephanie Mikeal HawthorneC Jawad is a 59 y.o. female with a history of CKD and hypertension [facility] complaining of shortness of breath and generalized weakness x 2 weeks. Admitted with acute hypoxemic respiratory failure. During hospital stay noted to have weakness of her left side. A head CT was completed, imaging reviewed, it shows an acute infarct in the right anterior cerebral artery territory   OT comments  Pt seen today for ADLs and functional mobility. Utilized the stedy lift equipment to assist pt [facility] and recliner chair. Pt demonstrates poor sitting balance and requires min A to maintain EOB sitting for more than a few seconds. Pt continues to require total A for LB ADLs, however able to perform sit<>stand with max A +2. Would recommend use of stedy for pivot transfers due to L knee buckling. Pt is an excellent candidate for CIR and would benefit from acute OT to promote functional use of LUE, strengthening, EOB sitting for ADLs, and functional transfers.   Follow Up Recommendations  CIR    Equipment Recommendations  Other (comment) (TBD in next venue of care)    Recommendations for Other Services      Precautions / Restrictions Precautions Precautions: Fall Restrictions Weight Bearing Restrictions: No       Mobility Bed Mobility Overal bed mobility: Needs Assistance;+2 for physical assistance Bed Mobility: Rolling;Sidelying to Sit Rolling: Min assist Sidelying to sit: Mod assist       General bed mobility comments: Assist to progress LLE to EOB, however pt did assist. VC's to bring LEs to EOB and to reach across with RUE for bed rail. Pt rolled to her left side with use of bed rail and required mod A to elevate trunk off bed.   Transfers Overall transfer level: Needs assistance Equipment used:  Ambulation equipment used Transfers: Sit to/from Stand Sit to Stand: Max assist;+2 physical assistance         General transfer comment: Max A +2 from EOB with gait-belt assisted transfer and L knee blocked. Pt unable to WB through LLE to take pivotal steps [facility]. Utilized stedy with max +2 for sit<>stand to assist [facility] and to recliner.     Balance Overall balance assessment: Needs assistance Sitting-balance support: Single extremity supported;Feet supported Sitting balance-Leahy Scale: Poor Sitting balance - Comments: pt required min A to maintain balance for more than a few seconds. VC and tactile cues to use LUE and RUE to assist with balance.  Postural control: Left lateral lean                         ADL Overall ADL's : Needs assistance/impaired Eating/Feeding: Sitting;Minimal assistance   Grooming: Wash/dry face;Minimal assistance;Sitting       Lower Body Bathing: Total assistance;+2 for physical assistance;Sit to/from stand (with use of Stedy)       Lower Body Dressing: Total assistance;+2 for physical assistance;Sit to/from stand Lower Body Dressing Details (indicate cue type and reason): with use of stedy Toilet Transfer: Maximal assistance;+2 for physical assistance;BSC Toilet Transfer Details (indicate cue type and reason): sit<>stand with use of stedy. Able to stand from raised bed and [facility] to [facility] then to recliner.  Toileting- Clothing Manipulation and Hygiene: Maximal assistance;+2 for physical assistance;Sit to/from stand Toileting - ArchitectClothing Manipulation Details (indicate  cue type and reason): pt able to perform pericare in sitting. Max  A to stand with use of stedy       General ADL Comments: Pt sat EOB with min A to maintain sitting balance due to Left lateral lean. Tactile cues and physical assistance to have pt use LUE and RUE to balance herself. Pt was able to stand with +2 max and and LLE blocked, however unsafe for SPT.  Utilized the stedy lift equipment to assist pt to transfer to [facility] with sit<>stand and then to the recliner. Pt able to participate in transfers with use of RUE and RLE. VC's for sequencing. Pt's daughter provided excellent pt advocacy and encouraged her mother to sit up.       Vision                 Additional Comments: L inattention          Cognition  Arousal/Alertness: Awake/Alert Behavior During Therapy: WFL for tasks assessed/performed Overall Cognitive Status: Within Functional Limits for tasks assessed                                    Pertinent Vitals/ Pain       Pain Assessment: No/denies pain         Frequency Min 3X/week     Progress Toward Goals  OT Goals(current goals can now be found in the care plan section)  Progress towards OT goals: Progressing toward goals     Plan Discharge plan remains appropriate       End of Session Equipment Utilized During Treatment: Gait belt;Other (comment);Oxygen (stedy lift equipment)   Activity Tolerance Patient tolerated treatment well   Patient Left in chair;with call bell/phone within reach;with family/visitor present   Nurse Communication Other (comment) (pt sitting in recliner)        Time: 1610-9604 OT Time Calculation (min): 35 min  Charges: OT General Charges $OT Visit: 1 Procedure OT Treatments $Self Care/Home Management : 8-22 mins $Therapeutic Activity: 8-22 mins  Nena Jordan M 06/26/2014, 2:25 PM  Carney Living, OTR/L Occupational Therapist [phone] (pager)

## 2014-06-26 NOTE — Anesthesia Preprocedure Evaluation (Signed)
Anesthesia Evaluation  Patient identified by MRN, date of birth, ID band Patient awake    Reviewed: Allergy & Precautions, NPO status , Patient's Chart, lab work & pertinent test results  Airway Mallampati: II  TM Distance: <3 FB Neck ROM: Full    Dental no notable dental hx.    Pulmonary Current Smoker,  breath sounds clear to auscultation  Pulmonary exam normal       Cardiovascular hypertension, Pt. on medications + Peripheral Vascular Disease Rhythm:Regular Rate:Normal     Neuro/Psych negative neurological ROS  negative psych ROS   GI/Hepatic negative GI ROS, Neg liver ROS,   Endo/Other  Morbid obesity  Renal/GU ESRFRenal disease  negative genitourinary   Musculoskeletal negative musculoskeletal ROS (+)   Abdominal   Peds negative pediatric ROS (+)  Hematology negative hematology ROS (+)   Anesthesia Other Findings   Reproductive/Obstetrics negative OB ROS                             Anesthesia Physical Anesthesia Plan  ASA: III  Anesthesia Plan: General   Post-op Pain Management:    Induction: Intravenous  Airway Management Planned: LMA and Oral ETT  Additional Equipment:   Intra-op Plan:   Post-operative Plan: Extubation in OR  Informed Consent: I have reviewed the patients History and Physical, chart, labs and discussed the procedure including the risks, benefits and alternatives for the proposed anesthesia with the patient or authorized representative who has indicated his/her understanding and acceptance.   Dental advisory given  Plan Discussed with: CRNA and Surgeon  Anesthesia Plan Comments:         Anesthesia Quick Evaluation

## 2014-06-27 ENCOUNTER — Inpatient Hospital Stay (HOSPITAL_COMMUNITY): Payer: Medicaid Other

## 2014-06-27 ENCOUNTER — Inpatient Hospital Stay (HOSPITAL_COMMUNITY): Payer: Medicaid Other | Admitting: Anesthesiology

## 2014-06-27 ENCOUNTER — Encounter (HOSPITAL_COMMUNITY): Payer: Self-pay | Admitting: Certified Registered Nurse Anesthetist

## 2014-06-27 ENCOUNTER — Encounter (HOSPITAL_COMMUNITY)
Admission: EM | Disposition: A | Discharge status: Rehabilitation Facility | Payer: Self-pay | Source: Home / Self Care | Attending: Internal Medicine

## 2014-06-27 DIAGNOSIS — I471 Supraventricular tachycardia: Secondary | ICD-10-CM | POA: Insufficient documentation

## 2014-06-27 HISTORY — PX: INSERTION OF DIALYSIS CATHETER: SHX1324

## 2014-06-27 LAB — RENAL FUNCTION PANEL
Albumin: 2.4 g/dL — ABNORMAL LOW (ref 3.5–5.2)
Anion gap: 9 (ref 5–15)
BUN: 73 mg/dL — ABNORMAL HIGH (ref 6–23)
CO2: 27 mmol/L (ref 19–32)
Calcium: 8.2 mg/dL — ABNORMAL LOW (ref 8.4–10.5)
Chloride: 101 mmol/L (ref 96–112)
Creatinine, Ser: 4.32 mg/dL — ABNORMAL HIGH (ref 0.50–1.10)
GFR calc Af Amer: 12 mL/min — ABNORMAL LOW (ref >90)
GFR calc non Af Amer: 10 mL/min — ABNORMAL LOW (ref >90)
Glucose, Bld: 196 mg/dL — ABNORMAL HIGH (ref 70–99)
Phosphorus: 5.6 mg/dL — ABNORMAL HIGH (ref 2.3–4.6)
Potassium: 5 mmol/L (ref 3.5–5.1)
Sodium: 137 mmol/L (ref 135–145)

## 2014-06-27 LAB — PROTIME-INR
INR: 1.23 (ref 0.00–1.49)
PROTHROMBIN TIME: 15.7 s — AB (ref 11.6–15.2)

## 2014-06-27 LAB — CBC
HEMATOCRIT: 38.5 % (ref 36.0–46.0)
HEMOGLOBIN: 12.2 g/dL (ref 12.0–15.0)
MCH: 26.2 pg (ref 26.0–34.0)
MCHC: 31.7 g/dL (ref 30.0–36.0)
MCV: 82.6 fL (ref 78.0–100.0)
PLATELETS: 276 10*3/uL (ref 150–400)
RBC: 4.66 MIL/uL (ref 3.87–5.11)
RDW: 18.9 % — ABNORMAL HIGH (ref 11.5–15.5)
WBC: 9.5 10*3/uL (ref 4.0–10.5)

## 2014-06-27 LAB — CLOSTRIDIUM DIFFICILE BY PCR: Toxigenic C. Difficile by PCR: NEGATIVE

## 2014-06-27 SURGERY — INSERTION OF DIALYSIS CATHETER
Anesthesia: General | Site: Chest | Laterality: Right

## 2014-06-27 MED ORDER — ONDANSETRON HCL 4 MG/2ML IJ SOLN
INTRAMUSCULAR | Status: AC
  Filled 2014-06-27: qty 2

## 2014-06-27 MED ORDER — LIDOCAINE HCL (PF) 1 % IJ SOLN
INTRAMUSCULAR | Status: AC
  Filled 2014-06-27: qty 30

## 2014-06-27 MED ORDER — HEPARIN SODIUM (PORCINE) 1000 UNIT/ML IJ SOLN
INTRAMUSCULAR | Status: AC
  Filled 2014-06-27: qty 1

## 2014-06-27 MED ORDER — ROCURONIUM BROMIDE 50 MG/5ML IV SOLN
INTRAVENOUS | Status: AC
  Filled 2014-06-27: qty 1

## 2014-06-27 MED ORDER — SODIUM CHLORIDE 0.9 % IR SOLN
Status: DC | PRN
  Administered 2014-06-27: 250 mL

## 2014-06-27 MED ORDER — FENTANYL CITRATE 0.05 MG/ML IJ SOLN
INTRAMUSCULAR | Status: DC | PRN
  Administered 2014-06-27: 50 ug via INTRAVENOUS

## 2014-06-27 MED ORDER — NEPRO/CARBSTEADY PO LIQD
237.0000 mL | ORAL | Status: DC | PRN
  Filled 2014-06-27: qty 237

## 2014-06-27 MED ORDER — PENTAFLUOROPROP-TETRAFLUOROETH EX AERO: 1.0000 "application " | INHALATION_SPRAY | CUTANEOUS | Status: DC | PRN

## 2014-06-27 MED ORDER — 0.9 % SODIUM CHLORIDE (POUR BTL) OPTIME
TOPICAL | Status: DC | PRN
  Administered 2014-06-27: 250 mL

## 2014-06-27 MED ORDER — LIDOCAINE-PRILOCAINE 2.5-2.5 % EX CREA
1.0000 "application " | TOPICAL_CREAM | CUTANEOUS | Status: DC | PRN
  Filled 2014-06-27: qty 5

## 2014-06-27 MED ORDER — ALTEPLASE 2 MG IJ SOLR
2.0000 mg | Freq: Once | INTRAMUSCULAR | Status: DC | PRN
  Filled 2014-06-27: qty 2

## 2014-06-27 MED ORDER — MIDAZOLAM HCL 2 MG/2ML IJ SOLN
INTRAMUSCULAR | Status: AC
  Filled 2014-06-27: qty 2

## 2014-06-27 MED ORDER — DEXTROSE 5 % IV SOLN
1.5000 g | INTRAVENOUS | Status: DC | PRN
  Administered 2014-06-27: 1.5 g via INTRAVENOUS

## 2014-06-27 MED ORDER — PROPOFOL 10 MG/ML IV BOLUS
INTRAVENOUS | Status: DC | PRN
  Administered 2014-06-27: 140 mg via INTRAVENOUS

## 2014-06-27 MED ORDER — PROPOFOL 10 MG/ML IV BOLUS
INTRAVENOUS | Status: AC
  Filled 2014-06-27: qty 20

## 2014-06-27 MED ORDER — PROMETHAZINE HCL 25 MG/ML IJ SOLN: 6.2500 mg | INTRAMUSCULAR | Status: DC | PRN

## 2014-06-27 MED ORDER — FENTANYL CITRATE 0.05 MG/ML IJ SOLN
INTRAMUSCULAR | Status: AC
  Filled 2014-06-27: qty 5

## 2014-06-27 MED ORDER — FENTANYL CITRATE 0.05 MG/ML IJ SOLN: 25.0000 ug | INTRAMUSCULAR | Status: DC | PRN

## 2014-06-27 MED ORDER — MIDAZOLAM HCL 5 MG/5ML IJ SOLN
INTRAMUSCULAR | Status: DC | PRN
  Administered 2014-06-27: 2 mg via INTRAVENOUS

## 2014-06-27 MED ORDER — ALTEPLASE 100 MG IV SOLR
5.0000 mg | Freq: Once | INTRAVENOUS | Status: AC
  Administered 2014-06-27: 5 mg
  Filled 2014-06-27: qty 5

## 2014-06-27 MED ORDER — LIDOCAINE HCL (CARDIAC) 20 MG/ML IV SOLN
INTRAVENOUS | Status: DC | PRN
  Administered 2014-06-27: 50 mg via INTRAVENOUS

## 2014-06-27 MED ORDER — SODIUM CHLORIDE 0.9 % IV SOLN: 100.0000 mL | INTRAVENOUS | Status: DC | PRN

## 2014-06-27 MED ORDER — SODIUM CHLORIDE 0.9 % IV SOLN
INTRAVENOUS | Status: DC
  Administered 2014-06-27 – 2014-07-17 (×3): via INTRAVENOUS

## 2014-06-27 MED ORDER — HEPARIN SODIUM (PORCINE) 1000 UNIT/ML IJ SOLN
INTRAMUSCULAR | Status: DC | PRN
  Administered 2014-06-27: 4.2 mL via INTRAVENOUS

## 2014-06-27 MED ORDER — LIDOCAINE HCL (CARDIAC) 20 MG/ML IV SOLN
INTRAVENOUS | Status: AC
  Filled 2014-06-27: qty 5

## 2014-06-27 MED ORDER — EPHEDRINE SULFATE 50 MG/ML IJ SOLN
INTRAMUSCULAR | Status: DC | PRN
  Administered 2014-06-27: 10 mg via INTRAVENOUS

## 2014-06-27 MED ORDER — DOXERCALCIFEROL 4 MCG/2ML IV SOLN
1.0000 ug | INTRAVENOUS | Status: DC
  Administered 2014-06-30: 1 ug via INTRAVENOUS
  Filled 2014-06-27: qty 2

## 2014-06-27 MED ORDER — ONDANSETRON HCL 4 MG/2ML IJ SOLN
INTRAMUSCULAR | Status: DC | PRN
  Administered 2014-06-27: 4 mg via INTRAVENOUS

## 2014-06-27 MED ORDER — LIDOCAINE HCL (PF) 1 % IJ SOLN: 5.0000 mL | INTRAMUSCULAR | Status: DC | PRN

## 2014-06-27 MED ORDER — SODIUM CHLORIDE 0.9 % IV SOLN
INTRAVENOUS | Status: DC | PRN
  Administered 2014-06-27: 08:00:00 via INTRAVENOUS

## 2014-06-27 MED ORDER — PHENYLEPHRINE HCL 10 MG/ML IJ SOLN
INTRAMUSCULAR | Status: DC | PRN
  Administered 2014-06-27: 160 ug via INTRAVENOUS
  Administered 2014-06-27: 120 ug via INTRAVENOUS

## 2014-06-27 SURGICAL SUPPLY — 42 items
BAG DECANTER FOR FLEXI CONT (MISCELLANEOUS) ×3 IMPLANT
BIOPATCH RED 1 DISK 7.0 (GAUZE/BANDAGES/DRESSINGS) ×2 IMPLANT
BIOPATCH RED 1IN DISK 7.0MM (GAUZE/BANDAGES/DRESSINGS) ×1
CATH CANNON HEMO 15F 50CM (CATHETERS) IMPLANT
CATH CANNON HEMO 15FR 19 (HEMODIALYSIS SUPPLIES) ×2 IMPLANT
CATH CANNON HEMO 15FR 23CM (HEMODIALYSIS SUPPLIES) IMPLANT
CATH CANNON HEMO 15FR 31CM (HEMODIALYSIS SUPPLIES) IMPLANT
CATH CANNON HEMO 15FR 32 (HEMODIALYSIS SUPPLIES) IMPLANT
CATH CANNON HEMO 15FR 32CM (HEMODIALYSIS SUPPLIES) IMPLANT
COVER PROBE W GEL 5X96 (DRAPES) IMPLANT
DECANTER SPIKE VIAL GLASS SM (MISCELLANEOUS) ×3 IMPLANT
DRAPE C-ARM 42X72 X-RAY (DRAPES) ×3 IMPLANT
DRAPE CHEST BREAST 15X10 FENES (DRAPES) ×3 IMPLANT
ELECT LOOP CUT MONO 26F .012 R (MISCELLANEOUS) ×2 IMPLANT
GAUZE SPONGE 2X2 8PLY STRL LF (GAUZE/BANDAGES/DRESSINGS) ×1 IMPLANT
GAUZE SPONGE 4X4 16PLY XRAY LF (GAUZE/BANDAGES/DRESSINGS) ×3 IMPLANT
GLOVE SS BIOGEL STRL SZ 7 (GLOVE) ×1 IMPLANT
GLOVE SUPERSENSE BIOGEL SZ 7 (GLOVE) ×2
GOWN STRL REUS W/ TWL LRG LVL3 (GOWN DISPOSABLE) ×2 IMPLANT
GOWN STRL REUS W/TWL LRG LVL3 (GOWN DISPOSABLE) ×6
KIT BASIN OR (CUSTOM PROCEDURE TRAY) ×3 IMPLANT
KIT ROOM TURNOVER OR (KITS) ×3 IMPLANT
NDL 18GX1X1/2 (RX/OR ONLY) (NEEDLE) ×1 IMPLANT
NDL HYPO 25GX1X1/2 BEV (NEEDLE) ×1 IMPLANT
NDL PERC 18GX7CM (NEEDLE) IMPLANT
NEEDLE 18GX1X1/2 (RX/OR ONLY) (NEEDLE) ×3 IMPLANT
NEEDLE 22X1 1/2 (OR ONLY) (NEEDLE) ×3 IMPLANT
NEEDLE HYPO 25GX1X1/2 BEV (NEEDLE) ×3 IMPLANT
NEEDLE PERC 18GX7CM (NEEDLE) ×3 IMPLANT
NS IRRIG 1000ML POUR BTL (IV SOLUTION) ×3 IMPLANT
PACK SURGICAL SETUP 50X90 (CUSTOM PROCEDURE TRAY) ×3 IMPLANT
PAD ARMBOARD 7.5X6 YLW CONV (MISCELLANEOUS) ×6 IMPLANT
SOAP 2 % CHG 4 OZ (WOUND CARE) ×3 IMPLANT
SPONGE GAUZE 2X2 STER 10/PKG (GAUZE/BANDAGES/DRESSINGS) ×2
SUT ETHILON 3 0 PS 1 (SUTURE) ×3 IMPLANT
SUT VICRYL 4-0 PS2 18IN ABS (SUTURE) ×3 IMPLANT
SYR 20CC LL (SYRINGE) ×3 IMPLANT
SYR 5ML LL (SYRINGE) ×6 IMPLANT
SYR CONTROL 10ML LL (SYRINGE) ×3 IMPLANT
SYRINGE 10CC LL (SYRINGE) ×3 IMPLANT
TAPE CLOTH SURG 4X10 WHT LF (GAUZE/BANDAGES/DRESSINGS) ×2 IMPLANT
WATER STERILE IRR 1000ML POUR (IV SOLUTION) ×3 IMPLANT

## 2014-06-27 NOTE — Interval H&P Note (Signed)
History and Physical Interval Note:  06/27/2014 7:36 AM  [name]  has presented today for surgery, with the diagnosis of RENAL FAILURE  The various methods of treatment have been discussed with the patient and family. After consideration of risks, benefits and other options for treatment, the patient has consented to  Procedure(s): INSERTION OF DIATEK CATHETER (N/A) as a surgical intervention .  The patient's history has been reviewed, patient examined, no change in status, stable for surgery.  I have reviewed the patient's chart and labs.  Questions were answered to the patient's satisfaction.     Josephina GipLAWSON, Carolee Channell

## 2014-06-27 NOTE — Progress Notes (Addendum)
Removed one yellow colored ring with pink stone that had Mom inscription in it. Placed this in labeled denture cup at bedside. Ring given to SomervilleBret, Charity fundraiserN from [id number]

## 2014-06-27 NOTE — Transfer of Care (Signed)
Immediate Anesthesia Transfer of Care Note  Patient: [name]  Procedure(s) Performed: Procedure(s): INSERTION OF Right Internal Jugular DIATEK CATHETER (Right)  Patient Location: PACU  Anesthesia Type:General  Level of Consciousness: sedated and responds to stimulation  Airway & Oxygen Therapy: Patient Spontanous Breathing and Patient connected to nasal cannula oxygen  Post-op Assessment: Report given to RN and Post -op Vital signs reviewed and stable  Post vital signs: Reviewed and stable  Last Vitals:  Filed Vitals:   06/27/14 0446  BP: 140/105  Pulse: 78  Temp: 36.5 C  Resp: 21    Complications: No apparent anesthesia complications

## 2014-06-27 NOTE — Progress Notes (Signed)
Patient Name: [name] Date of Encounter: 06/27/2014  Principal Problem:   Acute respiratory failure with hypoxia Active Problems:   Cerebral thrombosis with cerebral infarction   Hypertension   ESRD needing dialysis   Obesity (BMI 30-39.9)   Depression   Left renal mass   Chronic diastolic heart failure   H1N1 influenza   Tobacco use disorder   ARF (acute renal failure)   Renal failure (ARF), acute on chronic   Hyperlipidemia   PSVT (paroxysmal supraventricular tachycardia)   Hypoxia   Stroke   Paroxysmal atrial tachycardia   Length of Stay: 10  SUBJECTIVE  Continues to have bursts of SVT, not particularly symptomatic. No AFib is seen. Overall burden of arrhythmia reduced from yesterday   CURRENT MEDS . amLODipine  10 mg Oral Daily  . antiseptic oral rinse  7 mL Mouth Rinse BID  . aspirin  325 mg Oral Daily  . atorvastatin  10 mg Oral q1800  . calcium acetate  667 mg Oral TID WC  . citalopram  20 mg Oral Daily  . [START ON 06/30/2014] doxercalciferol  1 mcg Intravenous Q T,Th,Sa-HD  . fenofibrate  160 mg Oral Daily  . ferric gluconate (FERRLECIT/NULECIT) IV  125 mg Intravenous Q M,W,F-HD  . heparin subcutaneous  5,000 Units Subcutaneous 3 times per day  . ipratropium-albuterol  3 mL Nebulization TID  . metoprolol tartrate  25 mg Oral BID  . multivitamin  1 tablet Oral QHS  . predniSONE  40 mg Oral BID WC    OBJECTIVE   Intake/Output Summary (Last 24 hours) at 06/27/14 1334 Last data filed at 06/27/14 0830  Gross per 24 hour  Intake    220 ml  Output    111 ml  Net    109 ml   Filed Weights   06/26/14 0630 06/26/14 0812 06/27/14 0446  Weight: 222 lb 10.6 oz (101 kg) 222 lb 3.6 oz (100.8 kg) 223 lb 1.7 oz (101.2 kg)    PHYSICAL EXAM Filed Vitals:   06/27/14 0945 06/27/14 1026 06/27/14 1243 06/27/14 1325  BP: 122/91 110/77 90/65   Pulse: 80 92 76   Temp: 98 F (36.7 C)  97.4 F (36.3 C)   TempSrc:   Oral   Resp: 20 17 26    Height:       Weight:      SpO2: 98% 95% 94% 95%   General: Alert, oriented x3, no distress Head: no evidence of trauma, PERRL, EOMI, no exophtalmos or lid lag, no myxedema, no xanthelasma; normal ears, nose and oropharynx Neck: normal jugular venous pulsations and no hepatojugular reflux; brisk carotid pulses without delay and no carotid bruits Chest: clear to auscultation, no signs of consolidation by percussion or palpation, normal fremitus, symmetrical and full respiratory excursions Cardiovascular: normal position and quality of the apical impulse, regular rhythm, normal first and second heart sounds, no rubs or gallops, no murmur Abdomen: no tenderness or distention, no masses by palpation, no abnormal pulsatility or arterial bruits, normal bowel sounds, no hepatosplenomegaly Extremities: no clubbing, cyanosis or edema; 2+ radial, ulnar and brachial pulses bilaterally; 2+ right femoral, posterior tibial and dorsalis pedis pulses; 2+ left femoral, posterior tibial and dorsalis pedis pulses; no subclavian or femoral bruits. Left arm AV fistula Neurological: grossly nonfocal  LABS  CBC  Recent Labs  06/26/14 0511 06/27/14 0249  WBC 8.2 9.5  HGB 12.4 12.2  HCT 39.5 38.5  MCV 82.0 82.6  PLT 255 276   Basic Metabolic Panel  Recent Labs  06/24/14 1339  06/26/14 0511 06/26/14 0731 06/27/14 0249  NA 139  < > 134*  --  137  K 4.7  < > 5.4*  --  5.0  CL 101  < > 98  --  101  CO2 27  < > 25  --  27  GLUCOSE 75  < > 185*  --  196*  BUN 75*  < > 78*  --  73*  CREATININE 4.33*  < > 4.40*  --  4.32*  CALCIUM 8.6  < > 8.4  --  8.2*  MG  --   --   --  2.0  --   PHOS 5.8*  --   --   --  5.6*  < > = values in this interval not displayed. Liver Function Tests  Recent Labs  06/24/14 1339 06/27/14 0249  ALBUMIN 2.6* 2.4*    Radiology Studies Imaging results have been reviewed and Dg Chest Port 1 View  06/27/2014   CLINICAL DATA:  Status post dialysis catheter insertion. Initial encounter.   EXAM: PORTABLE CHEST - 1 VIEW  COMPARISON:  Chest radiograph performed 06/20/2014  FINDINGS: The patient's right-sided dual-lumen catheter is seen ending overlying the mid SVC.  Left basilar airspace opacity likely reflects atelectasis. A small left pleural effusion is suspected. No pneumothorax is seen.  The cardiomediastinal silhouette is mildly enlarged. No acute osseous abnormalities are identified.  IMPRESSION: 1. Right-sided dual-lumen catheter is seen ending overlying the mid SVC. 2. Left basilar airspace opacity likely reflects atelectasis. Suspect small left pleural effusion. 3. Mild cardiomegaly.   Electronically Signed   By: Roanna Raider M.D.   On: 06/27/2014 09:49   Dg Fluoro Guide Cv Line-no Report  06/27/2014   CLINICAL DATA:    FLOURO GUIDE CV LINE  Fluoroscopy was utilized by the requesting physician.  No radiographic  interpretation.     TELE Recurrent PAT    ASSESSMENT AND PLAN  Reviewed with Dr. Graciela Husbands. 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 Normal LV function. Since not very symptomatic, no plan for more aggressive antiarrhythmics right now. Outpatient Lexiscan Myoview when acute illness resolves. No atrial fib detected yet, but strong suspicion we may detect this, in which case will need anticoagulation.  Thurmon Fair, MD, [facility] [phone] office [phone] pager 06/27/2014 1:34 PM

## 2014-06-27 NOTE — Progress Notes (Signed)
AM nurse advised patient was not on contact precautions. Spoke to [place] on 3[place] and verified that patient was not on droplet precautions and has not been on droplet precautions in several days. Requested that Bosie ClosJudith removed order from active orders

## 2014-06-27 NOTE — Progress Notes (Signed)
[facility]                                                                              Patient Demographics  [name], is a 59 y.o. female, DOB - 21-Jan-1956, ZOX:[id number]  Admit date - 06/17/2014   Admitting Physician Lupita Leash, MD  Outpatient Primary MD for the patient is August Saucer, [name], MD  LOS - 10   Chief Complaint  Patient presents with  . Shortness of Breath  . Asthma  . Cough      HPI on Dr. Cyril Mourning (PCCM) on 06/17/2014 59 y/o female with CKD and hypertension came to the [facility] ED on 3/16 complaining of shortness of breath and generalized weakness x 2 weeks. Worse with exhertion, also described chest tightness during these times. She noted not taking her diuretic medications for majority of that time period. RLE edema was "bad" but has now resolved. She denied fever but noted some mild chills. In the ED she was noted to be hypoxemic and in what is believed to be acute on chronic renal failure. She was given lasix and renal and critical care medicine were consulted. Currently she feels much better and is breathing comfortably on 100% NRB.   Interim history Patient admitted to [facility] service and found to have uncompensated metabolic acidosis from worsening uremia, H1N1 flu. Renal ultrasound done noted mass off of left kidney. Patient transferred to [facility] for dialysis which greatly improved her breathing. On 3/18 patient noted to be hemiparetic on the left side and a CT scan noted an infarct of the right ACA and neurology consulted. Patient since stabilized and transferred to step down unit to hospitalist service. Patient's MRI notes focal areas of acute infarction throughout both hemispheres, consistent with an embolic phenomenon. Patient underwent stroke workup which had noted a negative TEE for clot, negative carotid Dopplers although she has had episodes of a flutter. She will likely need chronic long-term anticoagulation. Breathing continues to improve  and patient is down to approximately 3 L. She has continued to receive dialysis, followed by nephrology  Assessment & Plan   Acute respiratory failure with hypoxia -Patient continues to need nasal cannula, approximately 3 L -Chest x-ray: no acute cardiopulmonary disease -Likely multifactorial including influenza, chronic tobacco use with underlying COPD/volume overload  COPD with acute exacerbation -Possibly due to cor pulmonale -Continue steroids, taper down -Continue nebulizers, incentive spirometry   Acute CVA -MRI/MRA: Multifocal areas of acute infarction throughout both hemispheres, largest confluent area involving the right medial parasagittal frontal and posterior frontal cortex, distal ACA territory -TEE: Normal LV function, no LAA thrombus,severe RAE; mild RVE; severely reduced RV function; severe TR; bowing of atrial septum right to left; positive saline microcavitation study- PFO -Carotid Doppler: Bilateral 1-39% ICA stenosis, vertebral artery flow is antegrade -Lower extremity Doppler showed no SVT or DVT in bilateral lower extremities, Doppler waveforms are pulsatile suggestive of fluid overload -Echocardiogram: EF 60-65%, grade 1 diastolic dysfunction, severely elevated pulmonary pressure -LDL 77, hemoglobin A1c 6.5 -Neurology consulted and appreciated -Hypercoagulable workup: negative lupus anticoagulant antiphospholipid antibody -Continue aspirin-full dose, statin -Possible need for loop recorder vs 30day event monitor, which will be arranged for by cardiology -  PT and OT consulted and recommended CIR -CIR consulted and pending approval  Atrial flutter/paroxysmal SVT -Appears in SR at this time -Patient had a run of SVT overnight -Cardiology consulted and appreciated- recommended 30 day event monitor, which will be arranged at discharge -Continue continue metoprolol -Patient does have severe RV and RA dysfunction and enlargement on echo- making her more likely to have  Afib and other arrhythmias -Patient will likely need outpatient sleep study -Cardiology recommended outpatient lexiscan myoview; no good antiarrhythmic option other than amiodarone however currently not in atrial fibrillation and arrhythmia does not seem to be symptomatic  Chronic diastolic heart failure -Echocardiogram: EF 60-65%, grade 1 diastolic dysfunction -Continue to monitor intake, daily weights, hemodialysis  Hypertension -Continue amlodipine, metoprolol  End-stage renal disease on hemodialysis  -Nephrology consulted and appreciated -Fistulogram conducted showing dilatation of the cephalic vein beyond the arterial anastomosis -Patient unable to fully dialyze due to infiltration on 06/26/2014 -Vascular surgery consulted and recommended not using fistula, possible fistulogram next week with possible revision -Status post right internal jugular diatek catheter placement -Suspect patient may have dialysis today  Left renal mass -Found on renal ultrasound 06/18/2014, intermediate 1.9 cm hypoechoic mass of the interpolar region of the left kidney -Patient will need renal MRI  Influenza, H1N1 -Patient completed course of Tamiflu 06/22/2014  Hyperlipidemia -Lipid profile: TC 156, TG 294, HDL 20, LDL 77 -Continue statin and fenofibrate, will monitor for signs of myalgia  Tobacco abuse -Smoking cessation counseling given  Depression -Continue Celexa  Obesity -BMI greater than 30 -Patient with primary care physician regarding lifestyle modifications upon discharge  Code Status: Full  Family Communication: Daughter at bedside  Disposition Plan: Admitted  Time Spent in minutes   30 minutes  Procedures  Echocardiogram TEE Carotid Doppler Renal ultrasound Lower extremity Doppler Placement of right internal jugular diatek catheter  Consults   [facility] Cardiology Neurology Nephrology Inpatient rehab Vascular surgery  DVT Prophylaxis  heparin  Lab Results    Component Value Date   PLT 276 06/27/2014    Medications  Scheduled Meds: . amLODipine  10 mg Oral Daily  . antiseptic oral rinse  7 mL Mouth Rinse BID  . aspirin  325 mg Oral Daily  . atorvastatin  10 mg Oral q1800  . calcium acetate  667 mg Oral TID WC  . citalopram  20 mg Oral Daily  . [START ON 06/30/2014] doxercalciferol  1 mcg Intravenous Q T,Th,Sa-HD  . fenofibrate  160 mg Oral Daily  . ferric gluconate (FERRLECIT/NULECIT) IV  125 mg Intravenous Q M,W,F-HD  . heparin subcutaneous  5,000 Units Subcutaneous 3 times per day  . ipratropium-albuterol  3 mL Nebulization TID  . metoprolol tartrate  25 mg Oral BID  . multivitamin  1 tablet Oral QHS  . predniSONE  40 mg Oral BID WC   Continuous Infusions: . sodium chloride 10 mL/hr at 06/27/14 0659   PRN Meds:.sodium chloride, sodium chloride, acetaminophen, albuterol, allopurinol, heparin, heparin, hydrOXYzine, metoprolol, ondansetron (ZOFRAN) IV, oxyCODONE  Antibiotics    Anti-infectives    Start     Dose/Rate Route Frequency Ordered Stop   06/27/14 0600  cefUROXime (ZINACEF) 1.5 g in dextrose 5 % 50 mL IVPB     1.5 g 100 mL/hr over 30 Minutes Intravenous On call to O.R. 06/26/14 1019 06/27/14 0630   06/19/14 1800  oseltamivir (TAMIFLU) capsule 30 mg     30 mg Oral Every M-W-F (1800) 06/19/14 1003 06/22/14 1841   06/18/14 1600  oseltamivir (TAMIFLU)  capsule 30 mg  Status:  Discontinued     30 mg Oral Daily 06/18/14 1538 06/19/14 1003        Subjective:   [name] seen and examined today.  Patient denies any chest pain or shortness of breath at this time. She does state that she has some nausea however is hungry. Patient does state she had Cheerios this morning and has been having loose stools.  Objective:   Filed Vitals:   06/27/14 0930 06/27/14 0945 06/27/14 1026 06/27/14 1243  BP: 110/82 122/91 110/77 90/65  Pulse: 79 80 92 76  Temp:  98 F (36.7 C)  97.4 F (36.3 C)  TempSrc:    Oral  Resp: 19 20 17 26    Height:      Weight:      SpO2: 99% 98% 95% 94%    Wt Readings from Last 3 Encounters:  06/27/14 101.2 kg (223 lb 1.7 oz)  06/01/14 102.059 kg (225 lb)  01/28/14 104.327 kg (230 lb)     Intake/Output Summary (Last 24 hours) at 06/27/14 1318 Last data filed at 06/27/14 0830  Gross per 24 hour  Intake    220 ml  Output    111 ml  Net    109 ml    Exam  General: Well developed, well nourished, NAD  Cardiovascular: S1 S2 auscultated, no murmur, RRR  Respiratory: Clear to auscultation   Abdomen: Soft, obese, nontender, nondistended, + bowel sounds  Extremities: warm dry without cyanosis clubbing. Trace LE edema. LUE AVF  Data Review    Micro Results Recent Results (from the past 240 hour(s))  MRSA PCR Screening     Status: None   Collection Time: 06/17/14  9:29 PM  Result Value Ref Range Status   MRSA by PCR NEGATIVE NEGATIVE Final    Comment:        The GeneXpert MRSA Assay (FDA approved for NASAL specimens only), is one component of a comprehensive MRSA colonization surveillance program. It is not intended to diagnose MRSA infection nor to guide or monitor treatment for MRSA infections.   Clostridium Difficile by PCR     Status: None   Collection Time: 06/18/14  3:52 AM  Result Value Ref Range Status   C difficile by pcr NEGATIVE NEGATIVE Final    Radiology Reports Dg Chest 2 View  06/17/2014   CLINICAL DATA:  Shortness of breath and chest tenderness for 1 week. Oxygen dependent.  EXAM: CHEST  2 VIEW  COMPARISON:  Chest radiograph 01/28/2014.  FINDINGS: Increasing cardiomegaly. No focal infiltrates or congestive failure. No effusion or pneumothorax. Bones unremarkable.  IMPRESSION: Increasing cardiomegaly.  No active infiltrates or failure.   Electronically Signed   By: Davonna BellingJohn  Curnes M.D.   On: 06/17/2014 17:18   Ct Head Wo Contrast  06/19/2014   CLINICAL DATA:  Left-sided weakness.  Dialysis patient.  EXAM: CT HEAD WITHOUT CONTRAST  TECHNIQUE: Contiguous  axial images were obtained from the base of the skull through the vertex without intravenous contrast.  COMPARISON:  None  FINDINGS: Ill-defined hypodensity in the right medial frontal lobe consistent with acute infarct in the right anterior cerebral artery territory. No associated hemorrhage.  Negative for chronic ischemia. Ventricle size is normal. Negative for hemorrhage or mass lesion.  Calvarium intact.  Retention cyst in the left sphenoid sinus.  IMPRESSION: Acute infarct right anterior cerebral artery territory.  Critical Value/emergent results were called by telephone at the time of interpretation on 06/19/2014 at 12:53 pm to  Dr. Billy Fischer , who verbally acknowledged these results.   Electronically Signed   By: Marlan Palau M.D.   On: 06/19/2014 12:53   Mr Maxine Glenn Head Wo Contrast  06/20/2014   CLINICAL DATA:  Shortness of breath and generalized weakness for 2 weeks. LEFT hemiparesis and LEFT facial droop of recent onset, but unspecified duration. History of chronic renal disease and hypertension. Acute respiratory failure. Abnormal CT head.  EXAM: MRI HEAD WITHOUT CONTRAST  MRA HEAD WITHOUT CONTRAST  TECHNIQUE: Multiplanar, multiecho pulse sequences of the brain and surrounding structures were obtained without intravenous contrast. Angiographic images of the head were obtained using MRA technique without contrast.  COMPARISON:  CT head performed 06/19/2014.  FINDINGS: MRI HEAD FINDINGS  Only a limited number of pulse sequences could be obtained on the MRI exam. Diffusion axial and T1 sagittal images are interpreted.  Multiple areas of restricted diffusion throughout both hemispheres are identified consistent with acute infarction. The largest confluent area involves the RIGHT medial parasagittal frontal and posterior frontal cortex and subcortical white matter extending as far posteriorly as the central sulcus. Other smaller areas of restricted diffusion representing acute infarction can be seen in the  LEFT anterior frontal cortex, LEFT parietal parasagittal cortex, and the LEFT parietal subcortical white matter.  No gross evidence for hemorrhage, mass lesion, hydrocephalus, or extra-axial fluid based on axial DWI. No pituitary enlargement or tonsillar herniation. Upper cervical region unremarkable. No osseous lesions.  MRA HEAD FINDINGS  Dolichoectatic but widely patent internal carotid arteries. Dolichoectatic and widely patent basilar artery. Both vertebrals contribute to basilar formation with the RIGHT dominant. There is no large vessel occlusion, proximal intracranial stenosis, or visible berry aneurysm. Moderately diseased anterior inferior cerebellar arteries bilaterally. No RIGHT PICA is visualized.  The distal RIGHT anterior cerebral artery in its pericallosal segment appears moderately diseased corresponding to the observed RIGHT ACA territory infarct.  IMPRESSION: Multifocal areas of acute infarction throughout both hemispheres, with the largest confluent area involving the RIGHT medial parasagittal frontal and posterior frontal cortex, distal ACA territory.  A full MRI examination could not be completed, therefore comprehensive assessment of intracranial abnormalities, including characterization of the observed acute infarcts, is not possible.  No proximal large vessel occlusion on MRA intracranial exam.   Electronically Signed   By: Davonna Belling M.D.   On: 06/20/2014 17:11   Mr Brain Wo Contrast  06/20/2014   CLINICAL DATA:  Shortness of breath and generalized weakness for 2 weeks. LEFT hemiparesis and LEFT facial droop of recent onset, but unspecified duration. History of chronic renal disease and hypertension. Acute respiratory failure. Abnormal CT head.  EXAM: MRI HEAD WITHOUT CONTRAST  MRA HEAD WITHOUT CONTRAST  TECHNIQUE: Multiplanar, multiecho pulse sequences of the brain and surrounding structures were obtained without intravenous contrast. Angiographic images of the head were obtained  using MRA technique without contrast.  COMPARISON:  CT head performed 06/19/2014.  FINDINGS: MRI HEAD FINDINGS  Only a limited number of pulse sequences could be obtained on the MRI exam. Diffusion axial and T1 sagittal images are interpreted.  Multiple areas of restricted diffusion throughout both hemispheres are identified consistent with acute infarction. The largest confluent area involves the RIGHT medial parasagittal frontal and posterior frontal cortex and subcortical white matter extending as far posteriorly as the central sulcus. Other smaller areas of restricted diffusion representing acute infarction can be seen in the LEFT anterior frontal cortex, LEFT parietal parasagittal cortex, and the LEFT parietal subcortical white matter.  No gross evidence for  hemorrhage, mass lesion, hydrocephalus, or extra-axial fluid based on axial DWI. No pituitary enlargement or tonsillar herniation. Upper cervical region unremarkable. No osseous lesions.  MRA HEAD FINDINGS  Dolichoectatic but widely patent internal carotid arteries. Dolichoectatic and widely patent basilar artery. Both vertebrals contribute to basilar formation with the RIGHT dominant. There is no large vessel occlusion, proximal intracranial stenosis, or visible berry aneurysm. Moderately diseased anterior inferior cerebellar arteries bilaterally. No RIGHT PICA is visualized.  The distal RIGHT anterior cerebral artery in its pericallosal segment appears moderately diseased corresponding to the observed RIGHT ACA territory infarct.  IMPRESSION: Multifocal areas of acute infarction throughout both hemispheres, with the largest confluent area involving the RIGHT medial parasagittal frontal and posterior frontal cortex, distal ACA territory.  A full MRI examination could not be completed, therefore comprehensive assessment of intracranial abnormalities, including characterization of the observed acute infarcts, is not possible.  No proximal large vessel  occlusion on MRA intracranial exam.   Electronically Signed   By: Davonna Belling M.D.   On: 06/20/2014 17:11   US Renal  06/18/2014   CLINICAL DATA:  Patient with acute renal failure.  EXAM: RENAL/URINARY TRACT ULTRASOUND COMPLETE  COMPARISON:  None.  FINDINGS: Right Kidney:  Length: 8.4 cm. No hydronephrosis. Renal cortex is diffusely increased in echogenicity.  Left Kidney:  Length: 9.1 cm. There is a 1.9 x 1.7 x 1.5 cm hypoechoic mass off of the interpolar region of the left kidney.  Bladder:  Poorly distended.  Ascites within the pelvis.  IMPRESSION: Indeterminate 1.9 cm hypoechoic mass off the interpolar region of the left kidney. Evaluation is markedly limited due to body habitus. This may potentially represent a cyst however solid mass is not excluded. Recommend follow-up in the outpatient setting with either pre and post enhanced CT or MRI.  Echogenic right kidney most compatible with chronic medical renal disease.  No hydronephrosis.  Fluid within the pelvis.  These results will be called to the ordering clinician or representative by the Radiologist Assistant, and communication documented in the PACS or zVision Dashboard.   Electronically Signed   By: Annia Belt M.D.   On: 06/18/2014 13:20   Dg Chest Port 1 View  06/27/2014   CLINICAL DATA:  Status post dialysis catheter insertion. Initial encounter.  EXAM: PORTABLE CHEST - 1 VIEW  COMPARISON:  Chest radiograph performed 06/20/2014  FINDINGS: The patient's right-sided dual-lumen catheter is seen ending overlying the mid SVC.  Left basilar airspace opacity likely reflects atelectasis. A small left pleural effusion is suspected. No pneumothorax is seen.  The cardiomediastinal silhouette is mildly enlarged. No acute osseous abnormalities are identified.  IMPRESSION: 1. Right-sided dual-lumen catheter is seen ending overlying the mid SVC. 2. Left basilar airspace opacity likely reflects atelectasis. Suspect small left pleural effusion. 3. Mild  cardiomegaly.   Electronically Signed   By: Roanna Raider M.D.   On: 06/27/2014 09:49   Dg Chest Port 1 View  06/20/2014   CLINICAL DATA:  Hypoxia  EXAM: PORTABLE CHEST - 1 VIEW  COMPARISON:  06/17/2014  FINDINGS: Stable mild cardiomegaly. No mediastinal or hilar masses or evidence of adenopathy.  Clear lungs.  No pleural effusion or pneumothorax.  IMPRESSION: No acute cardiopulmonary disease. Stable appearance from the prior exam.   Electronically Signed   By: Amie Portland M.D.   On: 06/20/2014 09:08   Dg Fluoro Guide Cv Line-no Report  06/27/2014   CLINICAL DATA:    FLOURO GUIDE CV LINE  Fluoroscopy was utilized by the  requesting physician.  No radiographic  interpretation.    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  EXAM: DIALYSIS AV FISTULOGRAM  COMPARISON:  None.  CONTRAST:  50mL OMNIPAQUE IOHEXOL 300 MG/ML  SOLN  FLUOROSCOPY TIME:  24 seconds.  PROCEDURE: The procedure, risks, benefits, and alternatives were explained to the patient. Questions regarding the procedure were encouraged and answered. The patient understands and consents to the procedure.  The left arm native fistula was prepped with Betadine in a sterile fashion, and a sterile drape was applied covering the operative field. A diagnostic fistulogram was performed via an 18 gauge angiocatheter introduced into venous outflow. Venous drainage was assessed to the level of the central veins in the chest. Proximal fistula was studied by reflux maneuver with temporary compression of venous outflow. After the procedure, the angiocatheter was removed and hemostasis obtained with manual compression.  COMPLICATIONS: None  FINDINGS: Antecubital anastomosis between the brachial artery and cephalic vein is normally patent. After a short segment, there is focal aneurysmal dilatation of the vein in the antecubital fossa. There is competing venous  outflow noted in the cephalic vein as well as the deep venous system. The cephalic vein is tortuous but shows no significant focal stenoses. Central veins are normally patent, including the SVC.  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 There is competing venous outflow in the deep venous system of the upper arm.  ACCESS: No percutaneous intervention was indicated today.   Electronically Signed   By: Irish Lack M.D.   On: 06/22/2014 13:20    CBC  Recent Labs Lab 06/22/14 0405 06/24/14 1338 06/25/14 0309 06/26/14 0511 06/27/14 0249  WBC 9.7 11.9* 10.0 8.2 9.5  HGB 12.5 12.5 12.3 12.4 12.2  HCT 39.0 38.8 38.8 39.5 38.5  PLT 319 299 233 255 276  MCV 79.8 80.7 82.4 82.0 82.6  MCH 25.6* 26.0 26.1 25.7* 26.2  MCHC 32.1 32.2 31.7 31.4 31.7  RDW 18.8* 18.7* 18.8* 18.9* 18.9*    Chemistries   Recent Labs Lab 06/22/14 0405 06/24/14 1334 06/24/14 1339 06/25/14 0309 06/26/14 0511 06/26/14 0731 06/27/14 0249  NA 138  --  139 137 134*  --  137  K 3.7  --  4.7 5.2* 5.4*  --  5.0  CL 98  --  101 100 98  --  101  CO2 26  --  --  27  GLUCOSE 135*  --  75 113* 185*  --  196*  BUN 64*  --  75* 54* 78*  --  73*  CREATININE 5.63*  --  4.33* 3.39* 4.40*  --  4.32*  CALCIUM 8.8  --  8.6 8.1* 8.4  --  8.2*  MG  --  2.0  --   --   --  2.0  --    ------------------------------------------------------------------------------------------------------------------ estimated creatinine clearance is 16.7 mL/min (by C-G formula based on Cr of 4.32). ------------------------------------------------------------------------------------------------------------------ No results for input(s): HGBA1C in the last 72 hours. ------------------------------------------------------------------------------------------------------------------ No results for input(s): CHOL, HDL, LDLCALC, TRIG,  CHOLHDL, LDLDIRECT in the last 72 hours. ------------------------------------------------------------------------------------------------------------------ No results for input(s): TSH, T4TOTAL, T3FREE, THYROIDAB in the last 72 hours.  Invalid input(s): FREET3 ------------------------------------------------------------------------------------------------------------------ No results for input(s): VITAMINB12, FOLATE, FERRITIN, TIBC, IRON, RETICCTPCT in the last 72 hours.  Coagulation profile  Recent Labs  Lab 06/27/14 0249  INR 1.23    No results for input(s): DDIMER in the last 72 hours.  Cardiac Enzymes  Recent Labs Lab 06/20/14 2037  TROPONINI 0.68*   ------------------------------------------------------------------------------------------------------------------ Invalid input(s): POCBNP    [name] D.O. on 06/27/2014 at 1:18 PM  Between 7am to 7pm - Pager - [phone]  After 7pm go to [email or web address] - password [email or web address]  And look for the night coverage person covering for me after hours  [facility] Office  [phone]

## 2014-06-27 NOTE — Progress Notes (Signed)
Dr. Hart RochesterLawson made aware that am dose of Heparin 5,000 held until consultation with him. He advised not to give dose prior to surgery

## 2014-06-27 NOTE — Progress Notes (Signed)
Patient: [name] / Admit Date: 06/17/2014 / Date of Encounter: 06/27/2014, 11:48 AM   Subjective: Denies CP or SOB. Feels malaise today. When in SVT she feels a rushing sensation but is not significantly symptomatic  Underwent dialysis catheter yday   Objective: Telemetry: NSR with frequent brief episodes of PSVT Physical Exam: Blood pressure 110/77, pulse 92, temperature 98 F (36.7 C), temperature source Oral, resp. rate 17, height  (1.651 m), weight 223 lb 1.7 oz (101.2 kg), SpO2 95 %. General: Well developed chronically ill appearing F in no acute distress. Head: Normocephalic, atraumatic, sclera non-icteric, no xanthomas, nares are without discharge. Neck:  JVP not elevated. Lungs: Clear bilaterally to auscultation without wheezes, rales, or rhonchi. Breathing is unlabored. Heart: RRR S1 S2 without murmurs, rubs, or gallops.  Abdomen: Soft, non-tender, non-distended with normoactive bowel sounds. No rebound/guarding. Extremities: No clubbing or cyanosis. No edema. Distal pedal pulses are 2+ and equal bilaterally. Neuro: Alert and oriented X 3. Moves all extremities spontaneously. Psych:  Responds to questions appropriately with a normal affect.   Intake/Output Summary (Last 24 hours) at 06/27/14 1148 Last data filed at 06/27/14 0830  Gross per 24 hour  Intake    220 ml  Output    111 ml  Net    109 ml    Inpatient Medications:  . amLODipine  10 mg Oral Daily  . antiseptic oral rinse  7 mL Mouth Rinse BID  . aspirin  325 mg Oral Daily  . atorvastatin  10 mg Oral q1800  . calcium acetate  667 mg Oral TID WC  . citalopram  20 mg Oral Daily  . fenofibrate  160 mg Oral Daily  . ferric gluconate (FERRLECIT/NULECIT) IV  125 mg Intravenous Q M,W,F-HD  . heparin subcutaneous  5,000 Units Subcutaneous 3 times per day  . ipratropium-albuterol  3 mL Nebulization TID  . metoprolol tartrate  25 mg Oral BID  . multivitamin  1 tablet Oral QHS  . predniSONE  40 mg Oral BID  WC   Infusions:  . sodium chloride 10 mL/hr at 06/27/14 0659    Labs:  Recent Labs  06/24/14 1334 06/24/14 1339  06/26/14 0511 06/26/14 0731 06/27/14 0249  NA  --  139  < > 134*  --  137  K  --  4.7  < > 5.4*  --  5.0  CL  --  101  < > 98  --  101  CO2  --  27  < > 25  --  27  GLUCOSE  --  75  < > 185*  --  196*  BUN  --  75*  < > 78*  --  73*  CREATININE  --  4.33*  < > 4.40*  --  4.32*  CALCIUM  --  8.6  < > 8.4  --  8.2*  MG 2.0  --   --   --  2.0  --   PHOS  --  5.8*  --   --   --  5.6*  < > = values in this interval not displayed.  Recent Labs  06/24/14 1339 06/27/14 0249  ALBUMIN 2.6* 2.4*    Recent Labs  06/26/14 0511 06/27/14 0249  WBC 8.2 9.5  HGB 12.4 12.2  HCT 39.5 38.5  MCV 82.0 82.6  PLT 255 276   No results for input(s): CKTOTAL, CKMB, TROPONINI in the last 72 hours. Invalid input(s): POCBNP No results for input(s): HGBA1C in the  last 72 hours.   Radiology/Studies:  Dg Chest 2 View  06/17/2014   CLINICAL DATA:  Shortness of breath and chest tenderness for 1 week. Oxygen dependent.  EXAM: CHEST  2 VIEW  COMPARISON:  Chest radiograph 01/28/2014.  FINDINGS: Increasing cardiomegaly. No focal infiltrates or congestive failure. No effusion or pneumothorax. Bones unremarkable.  IMPRESSION: Increasing cardiomegaly.  No active infiltrates or failure.   Electronically Signed   By: Davonna Belling M.D.   On: 06/17/2014 17:18   Ct Head Wo Contrast  06/19/2014   CLINICAL DATA:  Left-sided weakness.  Dialysis patient.  EXAM: CT HEAD WITHOUT CONTRAST  TECHNIQUE: Contiguous axial images were obtained from the base of the skull through the vertex without intravenous contrast.  COMPARISON:  None  FINDINGS: Ill-defined hypodensity in the right medial frontal lobe consistent with acute infarct in the right anterior cerebral artery territory. No associated hemorrhage.  Negative for chronic ischemia. Ventricle size is normal. Negative for hemorrhage or mass lesion.   Calvarium intact.  Retention cyst in the left sphenoid sinus.  IMPRESSION: Acute infarct right anterior cerebral artery territory.  Critical Value/emergent results were called by telephone at the time of interpretation on 06/19/2014 at 12:53 pm to Dr. Billy Fischer , who verbally acknowledged these results.   Electronically Signed   By: Marlan Palau M.D.   On: 06/19/2014 12:53   Mr Maxine Glenn Head Wo Contrast  06/20/2014   CLINICAL DATA:  Shortness of breath and generalized weakness for 2 weeks. LEFT hemiparesis and LEFT facial droop of recent onset, but unspecified duration. History of chronic renal disease and hypertension. Acute respiratory failure. Abnormal CT head.  EXAM: MRI HEAD WITHOUT CONTRAST  MRA HEAD WITHOUT CONTRAST  TECHNIQUE: Multiplanar, multiecho pulse sequences of the brain and surrounding structures were obtained without intravenous contrast. Angiographic images of the head were obtained using MRA technique without contrast.  COMPARISON:  CT head performed 06/19/2014.  FINDINGS: MRI HEAD FINDINGS  Only a limited number of pulse sequences could be obtained on the MRI exam. Diffusion axial and T1 sagittal images are interpreted.  Multiple areas of restricted diffusion throughout both hemispheres are identified consistent with acute infarction. The largest confluent area involves the RIGHT medial parasagittal frontal and posterior frontal cortex and subcortical white matter extending as far posteriorly as the central sulcus. Other smaller areas of restricted diffusion representing acute infarction can be seen in the LEFT anterior frontal cortex, LEFT parietal parasagittal cortex, and the LEFT parietal subcortical white matter.  No gross evidence for hemorrhage, mass lesion, hydrocephalus, or extra-axial fluid based on axial DWI. No pituitary enlargement or tonsillar herniation. Upper cervical region unremarkable. No osseous lesions.  MRA HEAD FINDINGS  Dolichoectatic but widely patent internal carotid  arteries. Dolichoectatic and widely patent basilar artery. Both vertebrals contribute to basilar formation with the RIGHT dominant. There is no large vessel occlusion, proximal intracranial stenosis, or visible berry aneurysm. Moderately diseased anterior inferior cerebellar arteries bilaterally. No RIGHT PICA is visualized.  The distal RIGHT anterior cerebral artery in its pericallosal segment appears moderately diseased corresponding to the observed RIGHT ACA territory infarct.  IMPRESSION: Multifocal areas of acute infarction throughout both hemispheres, with the largest confluent area involving the RIGHT medial parasagittal frontal and posterior frontal cortex, distal ACA territory.  A full MRI examination could not be completed, therefore comprehensive assessment of intracranial abnormalities, including characterization of the observed acute infarcts, is not possible.  No proximal large vessel occlusion on MRA intracranial exam.   Electronically Signed  By: Davonna Belling M.D.   On: 06/20/2014 17:11   Mr Brain Wo Contrast  06/20/2014   CLINICAL DATA:  Shortness of breath and generalized weakness for 2 weeks. LEFT hemiparesis and LEFT facial droop of recent onset, but unspecified duration. History of chronic renal disease and hypertension. Acute respiratory failure. Abnormal CT head.  EXAM: MRI HEAD WITHOUT CONTRAST  MRA HEAD WITHOUT CONTRAST  TECHNIQUE: Multiplanar, multiecho pulse sequences of the brain and surrounding structures were obtained without intravenous contrast. Angiographic images of the head were obtained using MRA technique without contrast.  COMPARISON:  CT head performed 06/19/2014.  FINDINGS: MRI HEAD FINDINGS  Only a limited number of pulse sequences could be obtained on the MRI exam. Diffusion axial and T1 sagittal images are interpreted.  Multiple areas of restricted diffusion throughout both hemispheres are identified consistent with acute infarction. The largest confluent area involves  the RIGHT medial parasagittal frontal and posterior frontal cortex and subcortical white matter extending as far posteriorly as the central sulcus. Other smaller areas of restricted diffusion representing acute infarction can be seen in the LEFT anterior frontal cortex, LEFT parietal parasagittal cortex, and the LEFT parietal subcortical white matter.  No gross evidence for hemorrhage, mass lesion, hydrocephalus, or extra-axial fluid based on axial DWI. No pituitary enlargement or tonsillar herniation. Upper cervical region unremarkable. No osseous lesions.  MRA HEAD FINDINGS  Dolichoectatic but widely patent internal carotid arteries. Dolichoectatic and widely patent basilar artery. Both vertebrals contribute to basilar formation with the RIGHT dominant. There is no large vessel occlusion, proximal intracranial stenosis, or visible berry aneurysm. Moderately diseased anterior inferior cerebellar arteries bilaterally. No RIGHT PICA is visualized.  The distal RIGHT anterior cerebral artery in its pericallosal segment appears moderately diseased corresponding to the observed RIGHT ACA territory infarct.  IMPRESSION: Multifocal areas of acute infarction throughout both hemispheres, with the largest confluent area involving the RIGHT medial parasagittal frontal and posterior frontal cortex, distal ACA territory.  A full MRI examination could not be completed, therefore comprehensive assessment of intracranial abnormalities, including characterization of the observed acute infarcts, is not possible.  No proximal large vessel occlusion on MRA intracranial exam.   Electronically Signed   By: Davonna Belling M.D.   On: 06/20/2014 17:11   US Renal  06/18/2014   CLINICAL DATA:  Patient with acute renal failure.  EXAM: RENAL/URINARY TRACT ULTRASOUND COMPLETE  COMPARISON:  None.  FINDINGS: Right Kidney:  Length: 8.4 cm. No hydronephrosis. Renal cortex is diffusely increased in echogenicity.  Left Kidney:  Length: 9.1 cm. There  is a 1.9 x 1.7 x 1.5 cm hypoechoic mass off of the interpolar region of the left kidney.  Bladder:  Poorly distended.  Ascites within the pelvis.  IMPRESSION: Indeterminate 1.9 cm hypoechoic mass off the interpolar region of the left kidney. Evaluation is markedly limited due to body habitus. This may potentially represent a cyst however solid mass is not excluded. Recommend follow-up in the outpatient setting with either pre and post enhanced CT or MRI.  Echogenic right kidney most compatible with chronic medical renal disease.  No hydronephrosis.  Fluid within the pelvis.  These results will be called to the ordering clinician or representative by the Radiologist Assistant, and communication documented in the PACS or zVision Dashboard.   Electronically Signed   By: Annia Belt M.D.   On: 06/18/2014 13:20   Dg Chest Port 1 View  06/27/2014   CLINICAL DATA:  Status post dialysis catheter insertion. Initial encounter.  EXAM:  PORTABLE CHEST - 1 VIEW  COMPARISON:  Chest radiograph performed 06/20/2014  FINDINGS: The patient's right-sided dual-lumen catheter is seen ending overlying the mid SVC.  Left basilar airspace opacity likely reflects atelectasis. A small left pleural effusion is suspected. No pneumothorax is seen.  The cardiomediastinal silhouette is mildly enlarged. No acute osseous abnormalities are identified.  IMPRESSION: 1. Right-sided dual-lumen catheter is seen ending overlying the mid SVC. 2. Left basilar airspace opacity likely reflects atelectasis. Suspect small left pleural effusion. 3. Mild cardiomegaly.   Electronically Signed   By: Roanna Raider M.D.   On: 06/27/2014 09:49   Dg Chest Port 1 View  06/20/2014   CLINICAL DATA:  Hypoxia  EXAM: PORTABLE CHEST - 1 VIEW  COMPARISON:  06/17/2014  FINDINGS: Stable mild cardiomegaly. No mediastinal or hilar masses or evidence of adenopathy.  Clear lungs.  No pleural effusion or pneumothorax.  IMPRESSION: No acute cardiopulmonary disease. Stable  appearance from the prior exam.   Electronically Signed   By: Amie Portland M.D.   On: 06/20/2014 09:08   Dg Fluoro Guide Cv Line-no Report  06/27/2014   CLINICAL DATA:    FLOURO GUIDE CV LINE  Fluoroscopy was utilized by the requesting physician.  No radiographic  interpretation.    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  EXAM: DIALYSIS AV FISTULOGRAM  COMPARISON:  None.  CONTRAST:  50mL OMNIPAQUE IOHEXOL 300 MG/ML  SOLN  FLUOROSCOPY TIME:  24 seconds.  PROCEDURE: The procedure, risks, benefits, and alternatives were explained to the patient. Questions regarding the procedure were encouraged and answered. The patient understands and consents to the procedure.  The left arm native fistula was prepped with Betadine in a sterile fashion, and a sterile drape was applied covering the operative field. A diagnostic fistulogram was performed via an 18 gauge angiocatheter introduced into venous outflow. Venous drainage was assessed to the level of the central veins in the chest. Proximal fistula was studied by reflux maneuver with temporary compression of venous outflow. After the procedure, the angiocatheter was removed and hemostasis obtained with manual compression.  COMPLICATIONS: None  FINDINGS: Antecubital anastomosis between the brachial artery and cephalic vein is normally patent. After a short segment, there is focal aneurysmal dilatation of the vein in the antecubital fossa. There is competing venous outflow noted in the cephalic vein as well as the deep venous system. The cephalic vein is tortuous but shows no significant focal stenoses. Central veins are normally patent, including the SVC.  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 There is competing venous outflow in the deep venous system of  the upper arm.  ACCESS: No percutaneous intervention was indicated today.   Electronically Signed   By: Irish Lack M.D.   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 Cardiology consulted for question atrial fib with telemetry showing PSVT (question PAT). No definite atrial fib/flutter seen.    1. 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 Stroke noted on 06/19/14 - Small PFO noted by echo, no DVT/SVT by dopplers 06/18/14  - TEE 06/23/14: no LAA thrombus, severe RAE; mild RVE; severely reduced RV function; severe TR; bowing of atrial septum right to left; positive saline microcavitation study - EP did not think loop was indicated but instead feels 30-day monitor will be sufficient at discharge to assess for AF given high pretest probability in light of other arrhythmia  3. Elevated troponin without chest pain - peak 1.13 on admission - will need ischemic evaluation at some point - cardiac risk factors include HTN, HLD, newly diagnosed diabetes - anticipate outpatient Lexiscan nuclear stress test once recovered from acute illness and stroke  3. CKD progressing to ESRD requiring HD this admission, with hypekalemia - indeterminant 1.9cm renal mass noted on US 06/2014 - further management per nephrology/IM   Signed, Ronie Spiesayna Dunn PA-C  I have seen and  examined the patient along with Dayna Dunn PA-C.  I have reviewed the chart, notes and new data.  I agree with PA's note.  Key new complaints: she seems mostly unaware of the arrhythmia Key examination changes: episodes of brief paroxysmal atrial tachycardia (long RP, distinct negative P wave in inferior leads) with increased frequency today Key new findings / data: troponin 1 on admission during critical illness - doubt true ACS  PLAN: No good antiarrhythmic option (other than amiodarone) and arrhythmia is not particularly symptomatic - will continue with beta blocker for now but may ask EP opinion again if episodes are more persistent. Outpatient Lexiscan Myoview when acute illness resolves. No atrial fib detected yet, but strong suspicion we may detect this, in which case will need anticoagulation.  Thurmon FairMihai Croitoru, MD, [facility] [phone] 06/27/2014, 11:48 AM

## 2014-06-27 NOTE — Op Note (Signed)
OPERATIVE REPORT  Date of Surgery: 06/17/2014 - 06/27/2014  Surgeon: Josephina GipJames Jaevon Paras, MD  Assistant: Nurse  Pre-op Diagnosis: End stage renal disease with poorly functioning left arm AV fistula Post-op Diagnosis: Same Procedure: Procedure(s): INSERTION OF Right Internal Jugular DIATEK CATHETER-19 cm Bilateral ultrasound localization internal jugular veins  Anesthesia: LMA  EBL: Minimal  Complications: None  Procedure Details: The patient was taken the operating are placed in supine position at which time satisfactory general LMA anesthesia was minister. Both internal jugular veins were imaged using the mode ultrasound both noted to be widely patent. After prepping and draping in routine sterile manner right IJ was entered using a supraclavicular approach guidewire passed into the right atrium under fluoroscopic guidance. After dilating the track appropriately a 19 cm tunneled hemodialysis catheter was positioned in the right atrium tunneled peripherally secured with nylon sutures and the wound closed with Vicryl in a subcuticular fashion. Patient taken to recovery room for a chest x-ray   Josephina GipJames Adeleigh Barletta, MD 06/27/2014 8:22 AM

## 2014-06-27 NOTE — Anesthesia Postprocedure Evaluation (Signed)
  Anesthesia Post-op Note  Patient: [name]  Procedure(s) Performed: Procedure(s) (LRB): INSERTION OF Right Internal Jugular DIATEK CATHETER (Right)  Patient Location: PACU  Anesthesia Type: General  Level of Consciousness: awake and alert   Airway and Oxygen Therapy: Patient Spontanous Breathing  Post-op Pain: mild  Post-op Assessment: Post-op Vital signs reviewed, Patient's Cardiovascular Status Stable, Respiratory Function Stable, Patent Airway and No signs of Nausea or vomiting  Last Vitals:  Filed Vitals:   06/27/14 0839  BP: 106/73  Pulse: 84  Temp: 36.1 C  Resp: 16    Post-op Vital Signs: stable   Complications: No apparent anesthesia complications

## 2014-06-27 NOTE — H&P (View-Only) (Signed)
Vascular Surgery Consultation  Reason for Consult: Problems with left upper arm AV fistula  HPI: [name] is a 58 y.o. female who presents for evaluation of left upper arm AV fistula. Left brachial-cephalic AV fistula was created by Dr. fields in October 2015. It has been utilized but has had some problems recently with infiltration. Was seen in the office by Dr. Brabham about one month ago and ultrasound revealed one large competing branch. Patient had fistulogram performed on March 21 by IR. Earlier today unable to use fistula on hemodialysis.   Past Medical History  Diagnosis Date  . Asthma   . Hypertension   . Gout   . Arthritis   . Insomnia   . Chronic kidney disease   . Depression    Past Surgical History  Procedure Laterality Date  . Multiple tooth extractions    . Av fistula placement Left 01/28/2014    Procedure: ARTERIOVENOUS (AV) FISTULA CREATION;  Surgeon: Charles E Fields, MD;  Location: [facility] OR;  Service: Vascular;  Laterality: Left;  . Tee without cardioversion N/A 06/23/2014    Procedure: TRANSESOPHAGEAL ECHOCARDIOGRAM (TEE);  Surgeon: Brian S Crenshaw, MD;  Location: [facility] ENDOSCOPY;  Service: Cardiovascular;  Laterality: N/A;   History   Social History  . Marital Status: Single    Spouse Name: N/A  . Number of Children: N/A  . Years of Education: N/A   Social History Main Topics  . Smoking status: Current Every Day Smoker -- 0.50 packs/day for 20 years    Types: Cigarettes  . Smokeless tobacco: Never Used  . Alcohol Use: Yes     Comment: occ  . Drug Use: No  . Sexual Activity: Not on file   Other Topics Concern  . None   Social History Narrative   Family History  Problem Relation Age of Onset  . Diabetes Mother   . Hypertension Mother   . Heart disease Mother   . Heart attack Mother   . Peripheral vascular disease Mother   . Diabetes Father   . Heart disease Father   . Hypertension Father   . Diabetes Sister   . Hypertension Sister   .  Heart disease Sister     before age 60  . Heart attack Sister   . Peripheral vascular disease Sister   . Heart disease Brother   . Hyperlipidemia Daughter    No Known Allergies Prior to Admission medications   Medication Sig Start Date End Date Taking? Authorizing Provider  allopurinol (ZYLOPRIM) 100 MG tablet Take 100 mg by mouth 2 (two) times daily as needed (for gout).   Yes Historical Provider, MD  citalopram (CELEXA) 20 MG tablet Take 20 mg by mouth daily.   Yes Historical Provider, MD  furosemide (LASIX) 80 MG tablet Take 80 mg by mouth daily.   Yes Historical Provider, MD  hydrOXYzine (ATARAX/VISTARIL) 25 MG tablet Take 25 mg by mouth 3 (three) times daily as needed for anxiety.   Yes Historical Provider, MD  oxyCODONE (ROXICODONE) 5 MG immediate release tablet Take 1 tablet (5 mg total) by mouth every 6 (six) hours as needed for severe pain. Patient not taking: Reported on 06/17/2014 01/28/14   [name], PA-C     Positive ROS:   All other systems have been reviewed and were otherwise negative with the exception of those mentioned in the HPI and as above.  Physical Exam: Filed Vitals:   06/26/14 0830  BP: 138/99  Pulse: 88  Temp:     Resp: 20    General: Alert, no acute distress HEENT: Normal for age Cardiovascular: Regular rate and rhythm. Carotid pulses 2+, no bruits audible Respiratory: Clear to auscultation. No cyanosis, no use of accessory musculature GI: No organomegaly, abdomen is soft and non-tender Skin: No lesions in the area of chief complaint Neurologic: Sensation intact distally Psychiatric: Patient is competent for consent with normal mood and affect Musculoskeletal: No obvious deformities Extremities: Left upper extremity with excellent pulse and palpable thrill and brachial-cephalic AV fistula. There is some hyperpigmentation and thickening of skin overlying fistula. Well-perfused left hand with no evidence of steal.   Imaging reviewed:  Fistulogram from March 21 reviewed. There is focal aneurysmal area about 3 cm from arterial anastomosis which is significant and also large competing branch extending laterally.   Assessment/Plan:  Would recommend not using fistula the present time Will insert tunneled hemodialysis catheter tomorrow Will have Dr. fields review fistulogram next week and possibly patient will need revision.  discussed this with patient and she is agreeable for catheter insertion tomorrow   Dahiana Kulak, MD 06/26/2014 10:15 AM      

## 2014-06-27 NOTE — Progress Notes (Signed)
While receiving Hemodialysis, venous pressure increasing,  not resolving after flushing attempts changed out lines... Problem persisted...per PA Bard HerbertMarty Bergman applied TPA to dwell overnight and resume dialysis tomorrow.Marland Kitchen.Marland Kitchen..Marland Kitchen

## 2014-06-27 NOTE — Progress Notes (Signed)
[facility] Progress Note  Assessment/Plan:  1. PSVT - intermittent runs - on BB and asa - cards following 2. CVA/cryptogenic stroke - acute ACA, left hemiparesis; neuro following - seen by EP = rec 30 day event recorder to arrange at d/c and outpt sleep study 3. ESRD - new start this admission - problems with AVF again - had fistulagram 3/21 without intervention - showed focal aneurysmal dilitation and competing venous outflow in the deep venous system - asked VVS to evaluate - only ran only 1.25 hr 3/25 - higher venous pressures - bruising from prior problems - access somewhat deep/tortuous . Bleeding issues with access 3/23 but not on 3/25 - . Evaluated by Dr. Hart Rochester - right IJ placed today - Dr. Darrick Penna to review f'gram next week and decide on revision - plan addition 2 hr HD today K 5.0. CLIP plans pending 4. Anemia - Hgb 12.2 no need for ESA - repleting Fe 5. Secondary hyperparathyroidism - iPTH 338 - 3/21, 531 3/18 - not on vit D yet - start hectorol 1 6. HTN/volume - norvasc 10 and metoprolol 25 bid - may have to go up on MTP 7. Nutrition - alb 2.6 + vit 8. COPD/tobacco - encouraged smoking cessation. 9. S/p tx for Infuenza A/HIN1 10. Severe R HF per TEE is 11. Renal mass per [place] 3/16 - "Indeterminate 1.9 cm hypoechoic mass off the interpolar region of the left kidney. Evaluation is markedly limited due to body habitus. This may potentially represent a cyst however solid mass is not excluded. Recommend follow-up in the outpatient setting with either pre and post enhanced CT or MRI." 12. DM - per primary  1520 ADDENDUM:  Pt came to dialysis - system clotted with high venous pressures - had short episode of PSVT - HD re-intiated in supine position - no more PSVT - but ^^ venous pressures again with low Qb so HD was aborted. Pt asymptomatic throughout. Discussed with Dr. Arlean Hopping - plan cath flo overnight - recheck labs in am.  [name], PA-C [facility] Beeper [phone] 06/27/2014,12:21 PM  LOS: 10 days   Pt seen, examined and agree w A/P as above. Cath flows problems prob due to first use. Recheck labs in am.  [name] MD pager [phone]    cell [phone] 06/28/2014, 10:13 AM    Subjective:   Spoke with daughter - hoping to dialyze at [facility], but CLIP process not complete.  Pt's sister was Newt Lukes who also dialyzed at [facility] before she died. Pt had cheerios this am - always makes her go to the bathroom - having loose stools  Objective Filed Vitals:   06/27/14 0915 06/27/14 0930 06/27/14 0945 06/27/14 1026  BP: 107/82 110/82 122/91 110/77  Pulse: 83 79 80 92  Temp:   98 F (36.7 C)   TempSrc:      Resp:  Height:      Weight:      SpO2: 100% 99% 98% 95%   Physical Exam General: NAD Heart:RRR Lungs:coarse BS Abdomen: on bed pain Extremities: no sig edema Dialysis Access: new right IJ cath left upper AVF  Dialysis Orders: - none - new start to HD  Additional Objective Labs: Basic Metabolic Panel:  Recent Labs Lab 06/24/14 1339 06/25/14 0309 06/26/14 0511 06/27/14 0249  NA 139 137 134* 137  K 4.7 5.2* 5.4* 5.0  CL 101 100 98 101  CO2 GLUCOSE 75 113*  185* 196*  BUN 75* 54* 78* 73*  CREATININE 4.33* 3.39* 4.40* 4.32*  CALCIUM 8.6 8.1* 8.4 8.2*  PHOS 5.8*  --   --  5.6*   Liver Function Tests:  Recent Labs Lab 06/24/14 1339 06/27/14 0249  ALBUMIN 2.6* 2.4*   CBC:  Recent Labs Lab 06/22/14 0405 06/24/14 1338 06/25/14 0309 06/26/14 0511 06/27/14 0249  WBC 9.7 11.9* 10.0 8.2 9.5  HGB 12.5 12.5 12.3 12.4 12.2  HCT 39.0 38.8 38.8 39.5 38.5  MCV 79.8 80.7 82.4 82.0 82.6  PLT 319 299 233 255 276    Cardiac Enzymes:  Recent Labs Lab 06/20/14 2037  TROPONINI 0.68*   CBG:  Recent Labs Lab 06/22/14 2222 06/23/14 0825 06/23/14 1301 06/23/14 1659 06/24/14 0821  GLUCAP 143* 98 101* 121* 70   Lab Results  Component Value Date   INR 1.23 06/27/2014    INR 1.27 06/17/2014    Studies/Results: Dg Chest Port 1 View  06/27/2014   CLINICAL DATA:  Status post dialysis catheter insertion. Initial encounter.  EXAM: PORTABLE CHEST - 1 VIEW  COMPARISON:  Chest radiograph performed 06/20/2014  FINDINGS: The patient's right-sided dual-lumen catheter is seen ending overlying the mid SVC.  Left basilar airspace opacity likely reflects atelectasis. A small left pleural effusion is suspected. No pneumothorax is seen.  The cardiomediastinal silhouette is mildly enlarged. No acute osseous abnormalities are identified.  IMPRESSION: 1. Right-sided dual-lumen catheter is seen ending overlying the mid SVC. 2. Left basilar airspace opacity likely reflects atelectasis. Suspect small left pleural effusion. 3. Mild cardiomegaly.   Electronically Signed   By: Roanna RaiderJeffery  Chang M.D.   On: 06/27/2014 09:49   Dg Fluoro Guide Cv Line-no Report  06/27/2014   CLINICAL DATA:    FLOURO GUIDE CV LINE  Fluoroscopy was utilized by the requesting physician.  No radiographic  interpretation.    Medications: . sodium chloride 10 mL/hr at 06/27/14 0659   . amLODipine  10 mg Oral Daily  . antiseptic oral rinse  7 mL Mouth Rinse BID  . aspirin  325 mg Oral Daily  . atorvastatin  10 mg Oral q1800  . calcium acetate  667 mg Oral TID WC  . citalopram  20 mg Oral Daily  . fenofibrate  160 mg Oral Daily  . ferric gluconate (FERRLECIT/NULECIT) IV  125 mg Intravenous Q M,W,F-HD  . heparin subcutaneous  5,000 Units Subcutaneous 3 times per day  . ipratropium-albuterol  3 mL Nebulization TID  . metoprolol tartrate  25 mg Oral BID  . multivitamin  1 tablet Oral QHS  . predniSONE  40 mg Oral BID WC

## 2014-06-28 ENCOUNTER — Inpatient Hospital Stay (HOSPITAL_COMMUNITY): Payer: Medicaid Other

## 2014-06-28 DIAGNOSIS — R0602 Shortness of breath: Secondary | ICD-10-CM | POA: Diagnosis present

## 2014-06-28 LAB — BASIC METABOLIC PANEL
Anion gap: 11 (ref 5–15)
BUN: 77 mg/dL — ABNORMAL HIGH (ref 6–23)
CO2: 25 mmol/L (ref 19–32)
Calcium: 8.3 mg/dL — ABNORMAL LOW (ref 8.4–10.5)
Chloride: 99 mmol/L (ref 96–112)
Creatinine, Ser: 5.13 mg/dL — ABNORMAL HIGH (ref 0.50–1.10)
GFR calc Af Amer: 10 mL/min — ABNORMAL LOW (ref >90)
GFR calc non Af Amer: 8 mL/min — ABNORMAL LOW (ref >90)
Glucose, Bld: 152 mg/dL — ABNORMAL HIGH (ref 70–99)
Potassium: 5.6 mmol/L — ABNORMAL HIGH (ref 3.5–5.1)
Sodium: 135 mmol/L (ref 135–145)

## 2014-06-28 LAB — GLUCOSE, CAPILLARY: Glucose-Capillary: 93 mg/dL (ref 70–99)

## 2014-06-28 MED ORDER — HEPARIN SODIUM (PORCINE) 1000 UNIT/ML DIALYSIS: 1000.0000 [IU] | INTRAMUSCULAR | Status: DC | PRN

## 2014-06-28 MED ORDER — LIDOCAINE HCL (PF) 1 % IJ SOLN: 5.0000 mL | INTRAMUSCULAR | Status: DC | PRN

## 2014-06-28 MED ORDER — ALTEPLASE 2 MG IJ SOLR: 2.0000 mg | Freq: Once | INTRAMUSCULAR | Status: DC | PRN

## 2014-06-28 MED ORDER — NEPRO/CARBSTEADY PO LIQD: 237.0000 mL | ORAL | Status: DC | PRN

## 2014-06-28 MED ORDER — HEPARIN SODIUM (PORCINE) 1000 UNIT/ML DIALYSIS
2000.0000 [IU] | INTRAMUSCULAR | Status: DC | PRN
Start: 2014-06-28 — End: 2014-06-28

## 2014-06-28 MED ORDER — PENTAFLUOROPROP-TETRAFLUOROETH EX AERO: 1.0000 "application " | INHALATION_SPRAY | CUTANEOUS | Status: DC | PRN

## 2014-06-28 MED ORDER — SODIUM CHLORIDE 0.9 % IV SOLN: 100.0000 mL | INTRAVENOUS | Status: DC | PRN

## 2014-06-28 MED ORDER — LIDOCAINE-PRILOCAINE 2.5-2.5 % EX CREA: 1.0000 "application " | TOPICAL_CREAM | CUTANEOUS | Status: DC | PRN

## 2014-06-28 NOTE — Progress Notes (Signed)
Pt getting HD Tx. Pt had been restless and moving moving her R extremities around. BP taken 77/38 and pt was noted to be diaphoretic. UF off, pt put in trendelenburg and saline bolus given. BP improved briefly and then began dropping again. Pts LOC was declining. Very lethargic, gray and diaphoretic. Would moan to verbal stimulus, BP the dropped to 40/39 and rapid response was called and Dr. Arlean HoppingSchertz was paged and informed of pts Tx being stopped. Dr. Arlean HoppingSchertz orders for pt to get 200 cc NS bolus till BP improved and pt mentating better. Additional 400cc of fluid given post HD to get BP in the 90's. Pt remains drowsy but is oriented to person and place. Pt transported back to room by April, the rapid response nurse.

## 2014-06-28 NOTE — Progress Notes (Signed)
[facility]                                                                              Patient Demographics  [name], is a 59 y.o. female, DOB - 10/08/1955, ZOX:[id number]  Admit date - 06/17/2014   Admitting Physician Lupita Leash, MD  Outpatient Primary MD for the patient is August Saucer, [name], MD  LOS - 11   Chief Complaint  Patient presents with  . Shortness of Breath  . Asthma  . Cough      HPI on Dr. Cyril Mourning (PCCM) on 06/17/2014 59 y/o female with CKD and hypertension came to the [facility] ED on 3/16 complaining of shortness of breath and generalized weakness x 2 weeks. Worse with exhertion, also described chest tightness during these times. She noted not taking her diuretic medications for majority of that time period. RLE edema was "bad" but has now resolved. She denied fever but noted some mild chills. In the ED she was noted to be hypoxemic and in what is believed to be acute on chronic renal failure. She was given lasix and renal and critical care medicine were consulted. Currently she feels much better and is breathing comfortably on 100% NRB.   Interim history Patient admitted to [facility] service and found to have uncompensated metabolic acidosis from worsening uremia, H1N1 flu. Renal ultrasound done noted mass off of left kidney. Patient transferred to [facility] for dialysis which greatly improved her breathing. On 3/18 patient noted to be hemiparetic on the left side and a CT scan noted an infarct of the right ACA and neurology consulted. Patient since stabilized and transferred to step down unit to hospitalist service. Patient's MRI notes focal areas of acute infarction throughout both hemispheres, consistent with an embolic phenomenon. Patient underwent stroke workup which had noted a negative TEE for clot, negative carotid Dopplers although she has had episodes of a flutter. She will likely need chronic long-term anticoagulation. Breathing continues to improve  and patient is down to approximately 3 L. She has continued to receive dialysis, followed by nephrology  Assessment & Plan   Acute respiratory failure with hypoxia -Patient continues to need nasal cannula, approximately 3 L -Chest x-ray: no acute cardiopulmonary disease -Likely multifactorial including influenza, chronic tobacco use with underlying COPD/volume overload  COPD with acute exacerbation -Possibly due to cor pulmonale -Continue steroids, taper down -Continue nebulizers, incentive spirometry   Acute CVA -MRI/MRA: Multifocal areas of acute infarction throughout both hemispheres, largest confluent area involving the right medial parasagittal frontal and posterior frontal cortex, distal ACA territory -TEE: Normal LV function, no LAA thrombus,severe RAE; mild RVE; severely reduced RV function; severe TR; bowing of atrial septum right to left; positive saline microcavitation study- PFO -Carotid Doppler: Bilateral 1-39% ICA stenosis, vertebral artery flow is antegrade -Lower extremity Doppler showed no SVT or DVT in bilateral lower extremities, Doppler waveforms are pulsatile suggestive of fluid overload -Echocardiogram: EF 60-65%, grade 1 diastolic dysfunction, severely elevated pulmonary pressure -LDL 77, hemoglobin A1c 6.5 -Neurology consulted and appreciated -Hypercoagulable workup: negative lupus anticoagulant antiphospholipid antibody -Continue aspirin-full dose, statin -Possible need for loop recorder vs 30day event monitor, which will be arranged for by cardiology -  PT and OT consulted and recommended CIR -CIR consulted and pending approval  Atrial flutter/paroxysmal SVT -Appears in SR at this time -Patient had a run of SVT overnight -Cardiology consulted and appreciated- recommended 30 day event monitor, which will be arranged at discharge -Continue continue metoprolol -Patient does have severe RV and RA dysfunction and enlargement on echo- making her more likely to have  Afib and other arrhythmias -Patient will likely need outpatient sleep study -Cardiology recommended outpatient lexiscan myoview; no good antiarrhythmic option other than amiodarone however currently not in atrial fibrillation and arrhythmia does not seem to be symptomatic  Abdominal discomfort  -Cdiff PCR negative X2 -patient had a bowel movement early this morning -Denies nausea or vomiting -Unknown etiology  -Continue PRN pain control and antiemetics -obtain abd xray, LFTs  Chronic diastolic heart failure -Echocardiogram: EF 60-65%, grade 1 diastolic dysfunction -Continue to monitor intake, daily weights, hemodialysis  Hypertension -Continue amlodipine, metoprolol  End-stage renal disease on hemodialysis  -Nephrology consulted and appreciated -Fistulogram conducted showing dilatation of the cephalic vein beyond the arterial anastomosis -Patient unable to fully dialyze due to infiltration on 06/26/2014 -Vascular surgery consulted and recommended not using fistula, possible fistulogram next week with possible revision -Status post right internal jugular diatek catheter placement -Suspect patient may have dialysis today  Left renal mass -Found on renal ultrasound 06/18/2014, intermediate 1.9 cm hypoechoic mass of the interpolar region of the left kidney -Patient will need renal MRI  Influenza, H1N1 -Patient completed course of Tamiflu 06/22/2014  Hyperlipidemia -Lipid profile: TC 156, TG 294, HDL 20, LDL 77 -Continue statin and fenofibrate, will monitor for signs of myalgia  Tobacco abuse -Smoking cessation counseling given  Depression -Continue Celexa  Obesity -BMI greater than 30 -Patient with primary care physician regarding lifestyle modifications upon discharge  Code Status: Full  Family Communication: None at bedside  Disposition Plan: Admitted  Time Spent in minutes   30 minutes  Procedures  Echocardiogram TEE Carotid Doppler Renal ultrasound Lower  extremity Doppler Placement of right internal jugular diatek catheter  Consults   [facility] Cardiology Neurology Nephrology Inpatient rehab Vascular surgery  DVT Prophylaxis  heparin  Lab Results  Component Value Date   PLT 276 06/27/2014    Medications  Scheduled Meds: . amLODipine  10 mg Oral Daily  . antiseptic oral rinse  7 mL Mouth Rinse BID  . aspirin  325 mg Oral Daily  . atorvastatin  10 mg Oral q1800  . calcium acetate  667 mg Oral TID WC  . citalopram  20 mg Oral Daily  . [START ON 06/30/2014] doxercalciferol  1 mcg Intravenous Q T,Th,Sa-HD  . fenofibrate  160 mg Oral Daily  . ferric gluconate (FERRLECIT/NULECIT) IV  125 mg Intravenous Q M,W,F-HD  . heparin subcutaneous  5,000 Units Subcutaneous 3 times per day  . ipratropium-albuterol  3 mL Nebulization TID  . metoprolol tartrate  25 mg Oral BID  . multivitamin  1 tablet Oral QHS  . predniSONE  40 mg Oral BID WC   Continuous Infusions: . sodium chloride 10 mL/hr at 06/27/14 0659   PRN Meds:.sodium chloride, sodium chloride, acetaminophen, albuterol, allopurinol, heparin, heparin, hydrOXYzine, metoprolol, ondansetron (ZOFRAN) IV, oxyCODONE  Antibiotics    Anti-infectives    Start     Dose/Rate Route Frequency Ordered Stop   06/27/14 0600  cefUROXime (ZINACEF) 1.5 g in dextrose 5 % 50 mL IVPB     1.5 g 100 mL/hr over 30 Minutes Intravenous On call to O.R. 06/26/14 1019 06/27/14  0630   06/19/14 1800  oseltamivir (TAMIFLU) capsule 30 mg     30 mg Oral Every M-W-F (1800) 06/19/14 1003 06/22/14 1841   06/18/14 1600  oseltamivir (TAMIFLU) capsule 30 mg  Status:  Discontinued     30 mg Oral Daily 06/18/14 1538 06/19/14 1003        Subjective:   Kambrea Garner seen and examined today.  Patient complains of abdominal pain and cramping, on the right side.  She denies nausea/vomiting.  States her bowel movements are loose.  Denies chest pain or shortness of breath.  Feels she cannot get comfortable.   Objective:    Filed Vitals:   06/27/14 2050 06/28/14 0357 06/28/14 0452 06/28/14 0930  BP: 114/86 113/98 115/76 156/100  Pulse: 93 73 74 85  Temp: 97.6 F (36.4 C) 98.8 F (37.1 C) 99.2 F (37.3 C)   TempSrc: Oral Oral Oral   Resp: 23 23 21    Height:      Weight:  100.242 kg (220 lb 15.9 oz)    SpO2: 100% 99% 99%     Wt Readings from Last 3 Encounters:  06/28/14 100.242 kg (220 lb 15.9 oz)  06/01/14 102.059 kg (225 lb)  01/28/14 104.327 kg (230 lb)     Intake/Output Summary (Last 24 hours) at 06/28/14 1129 Last data filed at 06/27/14 2000  Gross per 24 hour  Intake    240 ml  Output    -43 ml  Net    283 ml    Exam  General: Well developed, well nourished, mild distress  Cardiovascular: S1 S2 auscultated, no murmur, RRR  Respiratory: Clear to auscultation   Abdomen: Soft, obese, RUQ TTP, nondistended, + bowel sounds  Extremities: warm dry without cyanosis clubbing. LUE AVF  Data Review    Micro Results Recent Results (from the past 240 hour(s))  Clostridium Difficile by PCR     Status: None   Collection Time: 06/27/14  7:40 PM  Result Value Ref Range Status   C difficile by pcr NEGATIVE NEGATIVE Final    Radiology Reports Dg Chest 2 View  06/17/2014   CLINICAL DATA:  Shortness of breath and chest tenderness for 1 week. Oxygen dependent.  EXAM: CHEST  2 VIEW  COMPARISON:  Chest radiograph 01/28/2014.  FINDINGS: Increasing cardiomegaly. No focal infiltrates or congestive failure. No effusion or pneumothorax. Bones unremarkable.  IMPRESSION: Increasing cardiomegaly.  No active infiltrates or failure.   Electronically Signed   By: Davonna Belling M.D.   On: 06/17/2014 17:18   Ct Head Wo Contrast  06/19/2014   CLINICAL DATA:  Left-sided weakness.  Dialysis patient.  EXAM: CT HEAD WITHOUT CONTRAST  TECHNIQUE: Contiguous axial images were obtained from the base of the skull through the vertex without intravenous contrast.  COMPARISON:  None  FINDINGS: Ill-defined hypodensity in  the right medial frontal lobe consistent with acute infarct in the right anterior cerebral artery territory. No associated hemorrhage.  Negative for chronic ischemia. Ventricle size is normal. Negative for hemorrhage or mass lesion.  Calvarium intact.  Retention cyst in the left sphenoid sinus.  IMPRESSION: Acute infarct right anterior cerebral artery territory.  Critical Value/emergent results were called by telephone at the time of interpretation on 06/19/2014 at 12:53 pm to Dr. Billy Fischer , who verbally acknowledged these results.   Electronically Signed   By: Marlan Palau M.D.   On: 06/19/2014 12:53   Mr Maxine Glenn Head Wo Contrast  06/20/2014   CLINICAL DATA:  Shortness of breath  and generalized weakness for 2 weeks. LEFT hemiparesis and LEFT facial droop of recent onset, but unspecified duration. History of chronic renal disease and hypertension. Acute respiratory failure. Abnormal CT head.  EXAM: MRI HEAD WITHOUT CONTRAST  MRA HEAD WITHOUT CONTRAST  TECHNIQUE: Multiplanar, multiecho pulse sequences of the brain and surrounding structures were obtained without intravenous contrast. Angiographic images of the head were obtained using MRA technique without contrast.  COMPARISON:  CT head performed 06/19/2014.  FINDINGS: MRI HEAD FINDINGS  Only a limited number of pulse sequences could be obtained on the MRI exam. Diffusion axial and T1 sagittal images are interpreted.  Multiple areas of restricted diffusion throughout both hemispheres are identified consistent with acute infarction. The largest confluent area involves the RIGHT medial parasagittal frontal and posterior frontal cortex and subcortical white matter extending as far posteriorly as the central sulcus. Other smaller areas of restricted diffusion representing acute infarction can be seen in the LEFT anterior frontal cortex, LEFT parietal parasagittal cortex, and the LEFT parietal subcortical white matter.  No gross evidence for hemorrhage, mass lesion,  hydrocephalus, or extra-axial fluid based on axial DWI. No pituitary enlargement or tonsillar herniation. Upper cervical region unremarkable. No osseous lesions.  MRA HEAD FINDINGS  Dolichoectatic but widely patent internal carotid arteries. Dolichoectatic and widely patent basilar artery. Both vertebrals contribute to basilar formation with the RIGHT dominant. There is no large vessel occlusion, proximal intracranial stenosis, or visible berry aneurysm. Moderately diseased anterior inferior cerebellar arteries bilaterally. No RIGHT PICA is visualized.  The distal RIGHT anterior cerebral artery in its pericallosal segment appears moderately diseased corresponding to the observed RIGHT ACA territory infarct.  IMPRESSION: Multifocal areas of acute infarction throughout both hemispheres, with the largest confluent area involving the RIGHT medial parasagittal frontal and posterior frontal cortex, distal ACA territory.  A full MRI examination could not be completed, therefore comprehensive assessment of intracranial abnormalities, including characterization of the observed acute infarcts, is not possible.  No proximal large vessel occlusion on MRA intracranial exam.   Electronically Signed   By: Davonna BellingJohn  Curnes M.D.   On: 06/20/2014 17:11   Mr Brain Wo Contrast  06/20/2014   CLINICAL DATA:  Shortness of breath and generalized weakness for 2 weeks. LEFT hemiparesis and LEFT facial droop of recent onset, but unspecified duration. History of chronic renal disease and hypertension. Acute respiratory failure. Abnormal CT head.  EXAM: MRI HEAD WITHOUT CONTRAST  MRA HEAD WITHOUT CONTRAST  TECHNIQUE: Multiplanar, multiecho pulse sequences of the brain and surrounding structures were obtained without intravenous contrast. Angiographic images of the head were obtained using MRA technique without contrast.  COMPARISON:  CT head performed 06/19/2014.  FINDINGS: MRI HEAD FINDINGS  Only a limited number of pulse sequences could be  obtained on the MRI exam. Diffusion axial and T1 sagittal images are interpreted.  Multiple areas of restricted diffusion throughout both hemispheres are identified consistent with acute infarction. The largest confluent area involves the RIGHT medial parasagittal frontal and posterior frontal cortex and subcortical white matter extending as far posteriorly as the central sulcus. Other smaller areas of restricted diffusion representing acute infarction can be seen in the LEFT anterior frontal cortex, LEFT parietal parasagittal cortex, and the LEFT parietal subcortical white matter.  No gross evidence for hemorrhage, mass lesion, hydrocephalus, or extra-axial fluid based on axial DWI. No pituitary enlargement or tonsillar herniation. Upper cervical region unremarkable. No osseous lesions.  MRA HEAD FINDINGS  Dolichoectatic but widely patent internal carotid arteries. Dolichoectatic and widely patent basilar artery.  Both vertebrals contribute to basilar formation with the RIGHT dominant. There is no large vessel occlusion, proximal intracranial stenosis, or visible berry aneurysm. Moderately diseased anterior inferior cerebellar arteries bilaterally. No RIGHT PICA is visualized.  The distal RIGHT anterior cerebral artery in its pericallosal segment appears moderately diseased corresponding to the observed RIGHT ACA territory infarct.  IMPRESSION: Multifocal areas of acute infarction throughout both hemispheres, with the largest confluent area involving the RIGHT medial parasagittal frontal and posterior frontal cortex, distal ACA territory.  A full MRI examination could not be completed, therefore comprehensive assessment of intracranial abnormalities, including characterization of the observed acute infarcts, is not possible.  No proximal large vessel occlusion on MRA intracranial exam.   Electronically Signed   By: Davonna Belling M.D.   On: 06/20/2014 17:11   US Renal  06/18/2014   CLINICAL DATA:  Patient with  acute renal failure.  EXAM: RENAL/URINARY TRACT ULTRASOUND COMPLETE  COMPARISON:  None.  FINDINGS: Right Kidney:  Length: 8.4 cm. No hydronephrosis. Renal cortex is diffusely increased in echogenicity.  Left Kidney:  Length: 9.1 cm. There is a 1.9 x 1.7 x 1.5 cm hypoechoic mass off of the interpolar region of the left kidney.  Bladder:  Poorly distended.  Ascites within the pelvis.  IMPRESSION: Indeterminate 1.9 cm hypoechoic mass off the interpolar region of the left kidney. Evaluation is markedly limited due to body habitus. This may potentially represent a cyst however solid mass is not excluded. Recommend follow-up in the outpatient setting with either pre and post enhanced CT or MRI.  Echogenic right kidney most compatible with chronic medical renal disease.  No hydronephrosis.  Fluid within the pelvis.  These results will be called to the ordering clinician or representative by the Radiologist Assistant, and communication documented in the PACS or zVision Dashboard.   Electronically Signed   By: Annia Belt M.D.   On: 06/18/2014 13:20   Dg Chest Port 1 View  06/27/2014   CLINICAL DATA:  Status post dialysis catheter insertion. Initial encounter.  EXAM: PORTABLE CHEST - 1 VIEW  COMPARISON:  Chest radiograph performed 06/20/2014  FINDINGS: The patient's right-sided dual-lumen catheter is seen ending overlying the mid SVC.  Left basilar airspace opacity likely reflects atelectasis. A small left pleural effusion is suspected. No pneumothorax is seen.  The cardiomediastinal silhouette is mildly enlarged. No acute osseous abnormalities are identified.  IMPRESSION: 1. Right-sided dual-lumen catheter is seen ending overlying the mid SVC. 2. Left basilar airspace opacity likely reflects atelectasis. Suspect small left pleural effusion. 3. Mild cardiomegaly.   Electronically Signed   By: Roanna Raider M.D.   On: 06/27/2014 09:49   Dg Chest Port 1 View  06/20/2014   CLINICAL DATA:  Hypoxia  EXAM: PORTABLE CHEST -  1 VIEW  COMPARISON:  06/17/2014  FINDINGS: Stable mild cardiomegaly. No mediastinal or hilar masses or evidence of adenopathy.  Clear lungs.  No pleural effusion or pneumothorax.  IMPRESSION: No acute cardiopulmonary disease. Stable appearance from the prior exam.   Electronically Signed   By: Amie Portland M.D.   On: 06/20/2014 09:08   Dg Fluoro Guide Cv Line-no Report  06/27/2014   CLINICAL DATA:    FLOURO GUIDE CV LINE  Fluoroscopy was utilized by the requesting physician.  No radiographic  interpretation.    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  EXAM: DIALYSIS AV FISTULOGRAM  COMPARISON:  None.  CONTRAST:  50mL OMNIPAQUE IOHEXOL 300 MG/ML  SOLN  FLUOROSCOPY TIME:  24 seconds.  PROCEDURE: The procedure, risks, benefits, and alternatives were explained to the patient. Questions regarding the procedure were encouraged and answered. The patient understands and consents to the procedure.  The left arm native fistula was prepped with Betadine in a sterile fashion, and a sterile drape was applied covering the operative field. A diagnostic fistulogram was performed via an 18 gauge angiocatheter introduced into venous outflow. Venous drainage was assessed to the level of the central veins in the chest. Proximal fistula was studied by reflux maneuver with temporary compression of venous outflow. After the procedure, the angiocatheter was removed and hemostasis obtained with manual compression.  COMPLICATIONS: None  FINDINGS: Antecubital anastomosis between the brachial artery and cephalic vein is normally patent. After a short segment, there is focal aneurysmal dilatation of the vein in the antecubital fossa. There is competing venous outflow noted in the cephalic vein as well as the deep venous system. The cephalic vein is tortuous but shows no significant focal stenoses. Central veins are normally patent,  including the SVC.  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 There is competing venous outflow in the deep venous system of the upper arm.  ACCESS: No percutaneous intervention was indicated today.   Electronically Signed   By: Irish Lack M.D.   On: 06/22/2014 13:20    CBC  Recent Labs Lab 06/22/14 0405 06/24/14 1338 06/25/14 0309 06/26/14 0511 06/27/14 0249  WBC 9.7 11.9* 10.0 8.2 9.5  HGB 12.5 12.5 12.3 12.4 12.2  HCT 39.0 38.8 38.8 39.5 38.5  PLT 319 299 233 255 276  MCV 79.8 80.7 82.4 82.0 82.6  MCH 25.6* 26.0 26.1 25.7* 26.2  MCHC 32.1 32.2 31.7 31.4 31.7  RDW 18.8* 18.7* 18.8* 18.9* 18.9*    Chemistries   Recent Labs Lab 06/24/14 1334 06/24/14 1339 06/25/14 0309 06/26/14 0511 06/26/14 0731 06/27/14 0249 06/28/14 0400  NA  --  139 137 134*  --  137 135  K  --  4.7 5.2* 5.4*  --  5.0 5.6*  CL  --  101 100 98  --  101 99  CO2  --  27 25 25   --  27 25  GLUCOSE  --  75 113* 185*  --  196* 152*  BUN  --  75* 54* 78*  --  73* 77*  CREATININE  --  4.33* 3.39* 4.40*  --  4.32* 5.13*  CALCIUM  --  8.6 8.1* 8.4  --  8.2* 8.3*  MG 2.0  --   --   --  2.0  --   --    ------------------------------------------------------------------------------------------------------------------ estimated creatinine clearance is 14 mL/min (by C-G formula based on Cr of 5.13). ------------------------------------------------------------------------------------------------------------------ No results for input(s): HGBA1C in the last 72 hours. ------------------------------------------------------------------------------------------------------------------ No results for input(s): CHOL, HDL, LDLCALC, TRIG, CHOLHDL, LDLDIRECT in the last 72 hours. ------------------------------------------------------------------------------------------------------------------ No results for input(s):  TSH, T4TOTAL, T3FREE, THYROIDAB in the last 72 hours.  Invalid input(s): FREET3 ------------------------------------------------------------------------------------------------------------------ No results for input(s): VITAMINB12, FOLATE, FERRITIN, TIBC, IRON, RETICCTPCT in the last 72 hours.  Coagulation profile  Recent Labs Lab 06/27/14 0249  INR 1.23    No results for input(s): DDIMER in the last 72 hours.  Cardiac Enzymes No results for input(s): CKMB, TROPONINI, MYOGLOBIN in the last 168 hours.  Invalid input(s): CK ------------------------------------------------------------------------------------------------------------------ Invalid input(s): POCBNP  Kacee Koren D.O. on 06/28/2014 at 11:29 AM  Between 7am to 7pm - Pager - [phone]  After 7pm go to [email or web address] - password [email or web address]  And look for the night coverage person covering for me after hours  [facility] Office  [phone]

## 2014-06-28 NOTE — Progress Notes (Signed)
Patient: [name] / Admit Date: 06/17/2014 / Date of Encounter: 06/28/2014, 9:31 AM   Subjective: Patient is generally upset that bed is comfortable. Required 4 person assist to get out of bed the other day. Nursing is trying to add pillows and recline to make her more comfortable. Reports all over discomfort and restlessness, also some pain under her breasts for several hours that is sore to the touch.    Objective: Telemetry: NSR, brief run of SVT last night but overall burden decreased Physical Exam: Blood pressure 115/76, pulse 74, temperature 99.2 F (37.3 C), temperature source Oral, resp. rate 21, height  (1.651 m), weight 220 lb 15.9 oz (100.242 kg), SpO2 99 %. General: Well developed chronically ill appearing F in no acute distress. Head: Normocephalic, atraumatic, sclera non-icteric, no xanthomas, nares are without discharge. Neck: JVP not elevated. Lungs: Clear bilaterally to auscultation without wheezes, rales, or rhonchi. Breathing is unlabored. Heart: RRR S1 S2 without murmurs, rubs, or gallops.  Abdomen: Soft, non-tender, non-distended with normoactive bowel sounds. No rebound/guarding. Extremities: No clubbing or cyanosis. No edema. Distal pedal pulses are 2+ and equal bilaterally. Neuro: Alert and oriented X 3. Moves all extremities spontaneously. Psych: Responds to questions appropriately with a normal affect.   Intake/Output Summary (Last 24 hours) at 06/28/14 0931 Last data filed at 06/27/14 2000  Gross per 24 hour  Intake    240 ml  Output    -43 ml  Net    283 ml    Inpatient Medications:  . amLODipine  10 mg Oral Daily  . antiseptic oral rinse  7 mL Mouth Rinse BID  . aspirin  325 mg Oral Daily  . atorvastatin  10 mg Oral q1800  . calcium acetate  667 mg Oral TID WC  . citalopram  20 mg Oral Daily  . [START ON 06/30/2014] doxercalciferol  1 mcg Intravenous Q T,Th,Sa-HD  . fenofibrate  160 mg Oral Daily  . ferric gluconate (FERRLECIT/NULECIT)  IV  125 mg Intravenous Q M,W,F-HD  . heparin subcutaneous  5,000 Units Subcutaneous 3 times per day  . ipratropium-albuterol  3 mL Nebulization TID  . metoprolol tartrate  25 mg Oral BID  . multivitamin  1 tablet Oral QHS  . predniSONE  40 mg Oral BID WC   Infusions:  . sodium chloride 10 mL/hr at 06/27/14 0659    Labs:  Recent Labs  06/26/14 0731 06/27/14 0249 06/28/14 0400  NA  --  137 135  K  --  5.0 5.6*  CL  --  101 99  CO2  --  27 25  GLUCOSE  --  196* 152*  BUN  --  73* 77*  CREATININE  --  4.32* 5.13*  CALCIUM  --  8.2* 8.3*  MG 2.0  --   --   PHOS  --  5.6*  --     Recent Labs  06/27/14 0249  ALBUMIN 2.4*    Recent Labs  06/26/14 0511 06/27/14 0249  WBC 8.2 9.5  HGB 12.4 12.2  HCT 39.5 38.5  MCV 82.0 82.6  PLT 255 276   No results for input(s): CKTOTAL, CKMB, TROPONINI in the last 72 hours. Invalid input(s): POCBNP No results for input(s): HGBA1C in the last 72 hours.   Radiology/Studies:  Dg Chest 2 View  06/17/2014   CLINICAL DATA:  Shortness of breath and chest tenderness for 1 week. Oxygen dependent.  EXAM: CHEST  2 VIEW  COMPARISON:  Chest radiograph 01/28/2014.  FINDINGS: Increasing cardiomegaly. No focal infiltrates or congestive failure. No effusion or pneumothorax. Bones unremarkable.  IMPRESSION: Increasing cardiomegaly.  No active infiltrates or failure.   Electronically Signed   By: Davonna Belling M.D.   On: 06/17/2014 17:18   Ct Head Wo Contrast  06/19/2014   CLINICAL DATA:  Left-sided weakness.  Dialysis patient.  EXAM: CT HEAD WITHOUT CONTRAST  TECHNIQUE: Contiguous axial images were obtained from the base of the skull through the vertex without intravenous contrast.  COMPARISON:  None  FINDINGS: Ill-defined hypodensity in the right medial frontal lobe consistent with acute infarct in the right anterior cerebral artery territory. No associated hemorrhage.  Negative for chronic ischemia. Ventricle size is normal. Negative for hemorrhage or  mass lesion.  Calvarium intact.  Retention cyst in the left sphenoid sinus.  IMPRESSION: Acute infarct right anterior cerebral artery territory.  Critical Value/emergent results were called by telephone at the time of interpretation on 06/19/2014 at 12:53 pm to Dr. Billy Fischer , who verbally acknowledged these results.   Electronically Signed   By: Marlan Palau M.D.   On: 06/19/2014 12:53   Mr Maxine Glenn Head Wo Contrast  06/20/2014   CLINICAL DATA:  Shortness of breath and generalized weakness for 2 weeks. LEFT hemiparesis and LEFT facial droop of recent onset, but unspecified duration. History of chronic renal disease and hypertension. Acute respiratory failure. Abnormal CT head.  EXAM: MRI HEAD WITHOUT CONTRAST  MRA HEAD WITHOUT CONTRAST  TECHNIQUE: Multiplanar, multiecho pulse sequences of the brain and surrounding structures were obtained without intravenous contrast. Angiographic images of the head were obtained using MRA technique without contrast.  COMPARISON:  CT head performed 06/19/2014.  FINDINGS: MRI HEAD FINDINGS  Only a limited number of pulse sequences could be obtained on the MRI exam. Diffusion axial and T1 sagittal images are interpreted.  Multiple areas of restricted diffusion throughout both hemispheres are identified consistent with acute infarction. The largest confluent area involves the RIGHT medial parasagittal frontal and posterior frontal cortex and subcortical white matter extending as far posteriorly as the central sulcus. Other smaller areas of restricted diffusion representing acute infarction can be seen in the LEFT anterior frontal cortex, LEFT parietal parasagittal cortex, and the LEFT parietal subcortical white matter.  No gross evidence for hemorrhage, mass lesion, hydrocephalus, or extra-axial fluid based on axial DWI. No pituitary enlargement or tonsillar herniation. Upper cervical region unremarkable. No osseous lesions.  MRA HEAD FINDINGS  Dolichoectatic but widely patent  internal carotid arteries. Dolichoectatic and widely patent basilar artery. Both vertebrals contribute to basilar formation with the RIGHT dominant. There is no large vessel occlusion, proximal intracranial stenosis, or visible berry aneurysm. Moderately diseased anterior inferior cerebellar arteries bilaterally. No RIGHT PICA is visualized.  The distal RIGHT anterior cerebral artery in its pericallosal segment appears moderately diseased corresponding to the observed RIGHT ACA territory infarct.  IMPRESSION: Multifocal areas of acute infarction throughout both hemispheres, with the largest confluent area involving the RIGHT medial parasagittal frontal and posterior frontal cortex, distal ACA territory.  A full MRI examination could not be completed, therefore comprehensive assessment of intracranial abnormalities, including characterization of the observed acute infarcts, is not possible.  No proximal large vessel occlusion on MRA intracranial exam.   Electronically Signed   By: Davonna Belling M.D.   On: 06/20/2014 17:11   Mr Brain Wo Contrast  06/20/2014   CLINICAL DATA:  Shortness of breath and generalized weakness for 2 weeks. LEFT hemiparesis and LEFT facial droop of recent onset,  but unspecified duration. History of chronic renal disease and hypertension. Acute respiratory failure. Abnormal CT head.  EXAM: MRI HEAD WITHOUT CONTRAST  MRA HEAD WITHOUT CONTRAST  TECHNIQUE: Multiplanar, multiecho pulse sequences of the brain and surrounding structures were obtained without intravenous contrast. Angiographic images of the head were obtained using MRA technique without contrast.  COMPARISON:  CT head performed 06/19/2014.  FINDINGS: MRI HEAD FINDINGS  Only a limited number of pulse sequences could be obtained on the MRI exam. Diffusion axial and T1 sagittal images are interpreted.  Multiple areas of restricted diffusion throughout both hemispheres are identified consistent with acute infarction. The largest  confluent area involves the RIGHT medial parasagittal frontal and posterior frontal cortex and subcortical white matter extending as far posteriorly as the central sulcus. Other smaller areas of restricted diffusion representing acute infarction can be seen in the LEFT anterior frontal cortex, LEFT parietal parasagittal cortex, and the LEFT parietal subcortical white matter.  No gross evidence for hemorrhage, mass lesion, hydrocephalus, or extra-axial fluid based on axial DWI. No pituitary enlargement or tonsillar herniation. Upper cervical region unremarkable. No osseous lesions.  MRA HEAD FINDINGS  Dolichoectatic but widely patent internal carotid arteries. Dolichoectatic and widely patent basilar artery. Both vertebrals contribute to basilar formation with the RIGHT dominant. There is no large vessel occlusion, proximal intracranial stenosis, or visible berry aneurysm. Moderately diseased anterior inferior cerebellar arteries bilaterally. No RIGHT PICA is visualized.  The distal RIGHT anterior cerebral artery in its pericallosal segment appears moderately diseased corresponding to the observed RIGHT ACA territory infarct.  IMPRESSION: Multifocal areas of acute infarction throughout both hemispheres, with the largest confluent area involving the RIGHT medial parasagittal frontal and posterior frontal cortex, distal ACA territory.  A full MRI examination could not be completed, therefore comprehensive assessment of intracranial abnormalities, including characterization of the observed acute infarcts, is not possible.  No proximal large vessel occlusion on MRA intracranial exam.   Electronically Signed   By: Davonna Belling M.D.   On: 06/20/2014 17:11   US Renal  06/18/2014   CLINICAL DATA:  Patient with acute renal failure.  EXAM: RENAL/URINARY TRACT ULTRASOUND COMPLETE  COMPARISON:  None.  FINDINGS: Right Kidney:  Length: 8.4 cm. No hydronephrosis. Renal cortex is diffusely increased in echogenicity.  Left Kidney:   Length: 9.1 cm. There is a 1.9 x 1.7 x 1.5 cm hypoechoic mass off of the interpolar region of the left kidney.  Bladder:  Poorly distended.  Ascites within the pelvis.  IMPRESSION: Indeterminate 1.9 cm hypoechoic mass off the interpolar region of the left kidney. Evaluation is markedly limited due to body habitus. This may potentially represent a cyst however solid mass is not excluded. Recommend follow-up in the outpatient setting with either pre and post enhanced CT or MRI.  Echogenic right kidney most compatible with chronic medical renal disease.  No hydronephrosis.  Fluid within the pelvis.  These results will be called to the ordering clinician or representative by the Radiologist Assistant, and communication documented in the PACS or zVision Dashboard.   Electronically Signed   By: Annia Belt M.D.   On: 06/18/2014 13:20   Dg Chest Port 1 View  06/27/2014   CLINICAL DATA:  Status post dialysis catheter insertion. Initial encounter.  EXAM: PORTABLE CHEST - 1 VIEW  COMPARISON:  Chest radiograph performed 06/20/2014  FINDINGS: The patient's right-sided dual-lumen catheter is seen ending overlying the mid SVC.  Left basilar airspace opacity likely reflects atelectasis. A small left pleural effusion is suspected.  No pneumothorax is seen.  The cardiomediastinal silhouette is mildly enlarged. No acute osseous abnormalities are identified.  IMPRESSION: 1. Right-sided dual-lumen catheter is seen ending overlying the mid SVC. 2. Left basilar airspace opacity likely reflects atelectasis. Suspect small left pleural effusion. 3. Mild cardiomegaly.   Electronically Signed   By: Roanna RaiderJeffery  Chang M.D.   On: 06/27/2014 09:49   Dg Chest Port 1 View  06/20/2014   CLINICAL DATA:  Hypoxia  EXAM: PORTABLE CHEST - 1 VIEW  COMPARISON:  06/17/2014  FINDINGS: Stable mild cardiomegaly. No mediastinal or hilar masses or evidence of adenopathy.  Clear lungs.  No pleural effusion or pneumothorax.  IMPRESSION: No acute cardiopulmonary  disease. Stable appearance from the prior exam.   Electronically Signed   By: Amie Portlandavid  Ormond M.D.   On: 06/20/2014 09:08   Dg Fluoro Guide Cv Line-no Report  06/27/2014   CLINICAL DATA:    FLOURO GUIDE CV LINE  Fluoroscopy was utilized by the requesting physician.  No radiographic  interpretation.    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  EXAM: DIALYSIS AV FISTULOGRAM  COMPARISON:  None.  CONTRAST:  50mL OMNIPAQUE IOHEXOL 300 MG/ML  SOLN  FLUOROSCOPY TIME:  24 seconds.  PROCEDURE: The procedure, risks, benefits, and alternatives were explained to the patient. Questions regarding the procedure were encouraged and answered. The patient understands and consents to the procedure.  The left arm native fistula was prepped with Betadine in a sterile fashion, and a sterile drape was applied covering the operative field. A diagnostic fistulogram was performed via an 18 gauge angiocatheter introduced into venous outflow. Venous drainage was assessed to the level of the central veins in the chest. Proximal fistula was studied by reflux maneuver with temporary compression of venous outflow. After the procedure, the angiocatheter was removed and hemostasis obtained with manual compression.  COMPLICATIONS: None  FINDINGS: Antecubital anastomosis between the brachial artery and cephalic vein is normally patent. After a short segment, there is focal aneurysmal dilatation of the vein in the antecubital fossa. There is competing venous outflow noted in the cephalic vein as well as the deep venous system. The cephalic vein is tortuous but shows no significant focal stenoses. Central veins are normally patent, including the SVC.  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 There is competing venous outflow in the deep  venous system of the upper arm.  ACCESS: No percutaneous intervention was indicated today.   Electronically Signed   By: Irish LackGlenn  Yamagata M.D.   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 Cardiology consulted for question atrial fib with telemetry showing PSVT (question PAT). No definite atrial fib/flutter seen.   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 Stroke noted on 06/19/14 - Small  PFO noted by echo, no DVT/SVT by dopplers 06/18/14  - TEE 06/23/14: no LAA thrombus, severe RAE; mild RVE; severely reduced RV function; severe TR; bowing of atrial septum right to left; positive saline microcavitation study - EP did not think loop was indicated but instead feels 30-day monitor will be sufficient at discharge to assess for AF given high pretest probability in light of other arrhythmia - please make sure we are contacted when patient is discharged so we can arrange  3. Elevated troponin  - peak 1.13 on admission - anticipate outpatient Lexiscan nuclear stress test once recovered from acute illness and stroke - initially did not have any chest pain, but has discomfort under breasts today which is worse with palpation - EKG not significantly change from prior  4. CKD progressing to ESRD requiring HD this admission, with hypekalemia - indeterminant 1.9cm renal mass noted on [place] 06/2014 - further  management per nephrology/IM  Signed, Ronie Spies PA-C  I have seen and examined the patient along with Dayna Dunn PA-C.  I have reviewed the chart, notes and new data.  I agree with PA's note.  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 No changes today. Plan outpatient event monitor and Lexiscan Myoview.  Thurmon Fair, MD, [facility] [phone] 06/28/2014, 10:38 AM

## 2014-06-28 NOTE — Progress Notes (Signed)
[facility] Progress Note  Assessment/Plan: 1. PSVT - intermittent runs - on BB and asa - cards following- had episode during HD 3/26 that spontaneously resolved 2. CVA/cryptogenic stroke - acute ACA, left hemiparesis; neuro following - seen by EP = rec 30 day event recorder to arrange at d/c and outpt sleep study 3. ESRD - new start this admission (sister was [facility], prev on HD at [facility])- problems with AVF 3/25 - had fistulagram 3/21 without intervention - showed focal aneurysmal dilitation and competing venous outflow in the deep venous system - asked VVS to evaluate - only ran only 1.25 hr 3/25 - higher venous pressures - bruising from prior problems - access somewhat deep/tortuous . Bleeding issues with access 3/23 but not on 3/25 - . Evaluated by Dr. Hart Rochester - right IJ placed 3/26 - Dr. Darrick Penna to review f'gram next week and decide on revision - cattheter poorly functional yesterday 3/26 - cath flo placed overnight K 5.6 today - volume status ok - plan try again today vs kayexalate - will d/w Dr. Arlean Hopping 4. Anemia - Hgb 12.2 no need for ESA - repleting Fe 5. Secondary hyperparathyroidism - iPTH 338 - 3/21, 531 3/18 - started hectorol 1 6. HTN/volume - norvasc 10 and metoprolol 25 bid  7. Nutrition - alb 2.6 + vit 8. COPD/tobacco - encouraged smoking cessation. 9. S/p tx for Infuenza A/HIN1 10. Severe R HF per TEE  11. Renal mass per [place] 3/16 - "Indeterminate 1.9 cm hypoechoic mass off the interpolar region of the left kidney. Evaluation is markedly limited due to body habitus.This may potentially represent a cyst however solid mass is not excluded. Recommend follow-up in the outpatient setting with eitherpre and post enhanced CT or MRI." 12. DM - per primary BS 70 - 120 13. Abdominal pain/cramping - new today - low grade temp 99.2 - no obvious etiology - follow  [name], PA-C  [facility] [phone] 06/28/2014,8:39 AM  LOS: 11 days   Pt  seen, examined and agree w A/P as above. Plan HD tonight, missed most of HD yesterday d/t cath malfunction.  S/p Cathflo.  [name] MD pager [phone]    cell [phone] 06/28/2014, 11:04 AM    Subjective:   Feels bad, though about the same as yesterday. Can't get comfortable in bed. Cramps in abdomen. No N or V. Doesn't want to eat breakfast. Last BM early am - liquid - also had BMs yesterday.   Objective Filed Vitals:   06/27/14 2000 06/27/14 2050 06/28/14 0357 06/28/14 0452  BP:  114/86 113/98 115/76  Pulse:  93 73 74  Temp:  97.6 F (36.4 C) 98.8 F (37.1 C) 99.2 F (37.3 C)  TempSrc:  Oral Oral Oral  Resp: Height:      Weight:   100.242 kg (220 lb 15.9 oz)   SpO2:  100% 99% 99%   Physical Exam General: ill uncomfortable Heart: RRR Lungs: coarse BS clear some with cough Abdomen: soft + BS, epigastric tenderness -no rebound/guarding Extremities:1+ LE edema Dialysis Access: new right IJ and left upper AVF + bruit - bruising  Dialysis Orders: none - new start to HD  Additional Objective Labs: Basic Metabolic Panel:  Recent Labs Lab 06/24/14 1339  06/26/14 0511 06/27/14 0249 06/28/14 0400  NA 139  < > 134* 137 135  K 4.7  < > 5.4* 5.0 5.6*  CL 101  < > 98 101 99  CO2 27  < >  25 27 25   GLUCOSE 75  < > 185* 196* 152*  BUN 75*  < > 78* 73* 77*  CREATININE 4.33*  < > 4.40* 4.32* 5.13*  CALCIUM 8.6  < > 8.4 8.2* 8.3*  PHOS 5.8*  --   --  5.6*  --   < > = values in this interval not displayed. Liver Function Tests:  Recent Labs Lab 06/24/14 1339 06/27/14 0249  ALBUMIN 2.6* 2.4*   CBC:  Recent Labs Lab 06/22/14 0405 06/24/14 1338 06/25/14 0309 06/26/14 0511 06/27/14 0249  WBC 9.7 11.9* 10.0 8.2 9.5  HGB 12.5 12.5 12.3 12.4 12.2  HCT 39.0 38.8 38.8 39.5 38.5  MCV 79.8 80.7 82.4 82.0 82.6  PLT 319 299 233 255 276   CBG:  Recent Labs Lab 06/22/14 2222 06/23/14 0825 06/23/14 1301 06/23/14 1659 06/24/14 0821  GLUCAP 143* 98  101* 121* 70  Medications: . sodium chloride 10 mL/hr at 06/27/14 0659   . amLODipine  10 mg Oral Daily  . antiseptic oral rinse  7 mL Mouth Rinse BID  . aspirin  325 mg Oral Daily  . atorvastatin  10 mg Oral q1800  . calcium acetate  667 mg Oral TID WC  . citalopram  20 mg Oral Daily  . [START ON 06/30/2014] doxercalciferol  1 mcg Intravenous Q T,Th,Sa-HD  . fenofibrate  160 mg Oral Daily  . ferric gluconate (FERRLECIT/NULECIT) IV  125 mg Intravenous Q M,W,F-HD  . heparin subcutaneous  5,000 Units Subcutaneous 3 times per day  . ipratropium-albuterol  3 mL Nebulization TID  . metoprolol tartrate  25 mg Oral BID  . multivitamin  1 tablet Oral QHS  . predniSONE  40 mg Oral BID WC

## 2014-06-29 ENCOUNTER — Inpatient Hospital Stay (HOSPITAL_COMMUNITY): Payer: Medicaid Other

## 2014-06-29 ENCOUNTER — Encounter (HOSPITAL_COMMUNITY): Payer: Self-pay | Admitting: Vascular Surgery

## 2014-06-29 LAB — COMPREHENSIVE METABOLIC PANEL
ALT: 47 U/L — ABNORMAL HIGH (ref 0–35)
ANION GAP: 19 — AB (ref 5–15)
AST: 40 U/L — ABNORMAL HIGH (ref 0–37)
Albumin: 2.5 g/dL — ABNORMAL LOW (ref 3.5–5.2)
Alkaline Phosphatase: 72 U/L (ref 39–117)
BILIRUBIN TOTAL: 2.2 mg/dL — AB (ref 0.3–1.2)
BUN: 84 mg/dL — ABNORMAL HIGH (ref 6–23)
CALCIUM: 9.2 mg/dL (ref 8.4–10.5)
CO2: 20 mmol/L (ref 19–32)
CREATININE: 6.15 mg/dL — AB (ref 0.50–1.10)
Chloride: 92 mmol/L — ABNORMAL LOW (ref 96–112)
GFR calc Af Amer: 8 mL/min — ABNORMAL LOW (ref >90)
GFR, EST NON AFRICAN AMERICAN: 7 mL/min — AB (ref >90)
Glucose, Bld: 116 mg/dL — ABNORMAL HIGH (ref 70–99)
Potassium: 7.3 mmol/L (ref 3.5–5.1)
Sodium: 131 mmol/L — ABNORMAL LOW (ref 135–145)
Total Protein: 6.1 g/dL (ref 6.0–8.3)

## 2014-06-29 LAB — BLOOD GAS, ARTERIAL
Acid-Base Excess: 3 mmol/L — ABNORMAL HIGH (ref 0.0–2.0)
Bicarbonate: 27.7 mEq/L — ABNORMAL HIGH (ref 20.0–24.0)
Drawn by: 365271
FIO2: 0.5 %
O2 Content: 12 L/min
O2 Saturation: 89.1 %
Patient temperature: 98.2
TCO2: 29.2 mmol/L (ref 0–100)
pCO2 arterial: 47 mmHg — ABNORMAL HIGH (ref 35.0–45.0)
pH, Arterial: 7.387 (ref 7.350–7.450)
pO2, Arterial: 63.7 mmHg — ABNORMAL LOW (ref 80.0–100.0)

## 2014-06-29 LAB — CBC
HEMATOCRIT: 41.5 % (ref 36.0–46.0)
HEMOGLOBIN: 13.5 g/dL (ref 12.0–15.0)
MCH: 26.4 pg (ref 26.0–34.0)
MCHC: 32.5 g/dL (ref 30.0–36.0)
MCV: 81.2 fL (ref 78.0–100.0)
Platelets: 298 10*3/uL (ref 150–400)
RBC: 5.11 MIL/uL (ref 3.87–5.11)
RDW: 19 % — ABNORMAL HIGH (ref 11.5–15.5)
WBC: 32.8 10*3/uL — ABNORMAL HIGH (ref 4.0–10.5)

## 2014-06-29 LAB — POTASSIUM: Potassium: 6.2 mmol/L (ref 3.5–5.1)

## 2014-06-29 MED ORDER — ALBUMIN HUMAN 25 % IV SOLN
INTRAVENOUS | Status: AC
  Administered 2014-06-29: 25 g via INTRAVENOUS
  Filled 2014-06-29: qty 100

## 2014-06-29 NOTE — Progress Notes (Signed)
ABG results called to RN.

## 2014-06-29 NOTE — Progress Notes (Signed)
Paged [facility] on call. Pt is getting tachypnic and breath sounds are wet. Told to talk with Nephro to see about future plans for HD and removing fluid

## 2014-06-29 NOTE — Progress Notes (Signed)
Subjective: She reports chest soreness worse with inspiration.  Daughter reports the patient's feet are always cold.  Objective: Vital signs in last 24 hours: Temp:  [98 F (36.7 C)-98.7 F (37.1 C)] 98.6 F (37 C) (03/28 [id number]) Pulse Rate:  [82-99] 88 (03/28 0833) Resp:  [20-100] 27 (03/28 0355) BP: (40-158)/(34-98) 127/87 mmHg (03/28 0833) SpO2:  [91 %-100 %] 93 % (03/28 0855) Weight:  [225 lb 5 oz (102.2 kg)-233 lb 7.5 oz (105.9 kg)] 233 lb 7.5 oz (105.9 kg) (03/28 0355) Last BM Date: 06/28/14  Intake/Output from previous day: 03/27 0701 - 03/28 0700 In: 100 [P.O.:100] Out: -305  Intake/Output this shift:    Medications Current Facility-Administered Medications  Medication Dose Route Frequency Provider Last Rate Last Dose  . 0.9 %  sodium chloride infusion  100 mL Intravenous PRN Delano Metzobert Schertz, MD      . 0.9 %  sodium chloride infusion  100 mL Intravenous PRN Delano Metzobert Schertz, MD      . 0.9 %  sodium chloride infusion   Intravenous Continuous Arta BruceKevin Ossey, MD 10 mL/hr at 06/27/14 [phone]    . acetaminophen (TYLENOL) tablet 650 mg  650 mg Oral Q4H PRN Lupita Leashouglas B McQuaid, MD   650 mg at 06/28/14 1339  . albuterol (PROVENTIL) (2.5 MG/3ML) 0.083% nebulizer solution 2.5 mg  2.5 mg Nebulization Q3H PRN Duayne CalPaul W Hoffman, NP      . allopurinol (ZYLOPRIM) tablet 100 mg  100 mg Oral BID PRN Lupita Leashouglas B McQuaid, MD      . antiseptic oral rinse (CPC / CETYLPYRIDINIUM CHLORIDE 0.05%) solution 7 mL  7 mL Mouth Rinse BID Lupita Leashouglas B McQuaid, MD   7 mL at 06/28/14 2200  . aspirin tablet 325 mg  325 mg Oral Daily Marvel PlanJindong Xu, MD   325 mg at 06/28/14 0942  . atorvastatin (LIPITOR) tablet 10 mg  10 mg Oral q1800 Maryann Mikhail, DO   10 mg at 06/28/14 1716  . calcium acetate (PHOSLO) capsule 667 mg  667 mg Oral TID WC Ethelene HalJames W Lin, MD   667 mg at 06/28/14 1716  . citalopram (CELEXA) tablet 20 mg  20 mg Oral Daily Lupita Leashouglas B McQuaid, MD   20 mg at 06/28/14 0929  . [START ON 06/30/2014] doxercalciferol  (HECTOROL) injection 1 mcg  1 mcg Intravenous Q T,Th,Sa-HD Weston SettleMartha Bergman, PA-C      . fenofibrate tablet 160 mg  160 mg Oral Daily Hollice EspySendil K Krishnan, MD   160 mg at 06/28/14 0929  . ferric gluconate (NULECIT) 125 mg in sodium chloride 0.9 % 100 mL IVPB  125 mg Intravenous Q M,W,F-HD Delano Metzobert Schertz, MD   125 mg at 06/26/14 1342  . heparin injection 1,000 Units  1,000 Units Dialysis PRN Delano Metzobert Schertz, MD      . heparin injection 2,000 Units  2,000 Units Dialysis PRN Delano Metzobert Schertz, MD      . heparin injection 5,000 Units  5,000 Units Subcutaneous 3 times per day Lupita Leashouglas B McQuaid, MD   5,000 Units at 06/29/14 0607  . hydrOXYzine (ATARAX/VISTARIL) tablet 25 mg  25 mg Oral TID PRN Lupita Leashouglas B McQuaid, MD   25 mg at 06/28/14 0951  . ipratropium-albuterol (DUONEB) 0.5-2.5 (3) MG/3ML nebulizer solution 3 mL  3 mL Nebulization TID Hollice EspySendil K Krishnan, MD   3 mL at 06/29/14 0855  . metoprolol (LOPRESSOR) injection 5 mg  5 mg Intravenous Q6H PRN Hollice EspySendil K Krishnan, MD   2.5 mg at 06/24/14 0739  . metoprolol  tartrate (LOPRESSOR) tablet 25 mg  25 mg Oral BID Delano Metz, MD   25 mg at 06/28/14 2012  . multivitamin (RENA-VIT) tablet 1 tablet  1 tablet Oral QHS Jetty Duhamel, NP   1 tablet at 06/28/14 2012  . ondansetron (ZOFRAN) injection 4 mg  4 mg Intravenous Q6H PRN Lupita Leash, MD   4 mg at 06/18/14 0304  . oxyCODONE (Oxy IR/ROXICODONE) immediate release tablet 5 mg  5 mg Oral Q6H PRN Lupita Leash, MD   5 mg at 06/29/14 1138  . predniSONE (DELTASONE) tablet 40 mg  40 mg Oral BID WC Hollice Espy, MD   40 mg at 06/29/14 0607    PE: General appearance: alert, cooperative and no distress Lungs: clear to auscultation bilaterally Heart: regular rate and rhythm, S1, S2 normal, no murmur, click, rub or gallop Abdomen: +BS mildly tender Extremities: No LEE Pulses: +2 radials.  barely palpable pedal pulses.  Skin: Feet are very cool. 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 Cardiology consulted for question atrial fib with telemetry showing PSVT (question PAT). No definite atrial fib/flutter seen.   1. 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  2. Stroke noted on 06/19/14 - Small PFO noted by echo, no DVT/SVT by dopplers 06/18/14  - TEE 06/23/14: no LAA thrombus, severe RAE; mild RVE; severely reduced RV function; severe TR; bowing of atrial septum right to left; positive saline microcavitation study - EP did not think loop was indicated but instead feels 30-day monitor will be sufficient at discharge to assess for AF given high pretest probability in light of other arrhythmia - please make sure we are contacted when patient is discharged so we can arrange  3. Elevated troponin  - peak 1.13 on admission - anticipate outpatient Lexiscan nuclear stress test once recovered from acute illness and stroke - initially did not have any chest pain, but has discomfort under breasts today which is worse with  palpation - EKG not significantly change from prior  4. CKD progressing to ESRD requiring HD this admission, with hypekalemia - indeterminant 1.9cm renal mass noted on [place] 06/2014 - further management per nephrology/IM  5 Feet are very cool.  Will doppler pulses when she gets back from HD.   Mild hypotension.  Otherwise stable.    LOS: 12 days    HAGER, BRYAN PA-C 06/29/2014 11:48 AM  Patient seen and  examined. I agree with the assessment and plan as detailed above. See also my additional thoughts below.   The patient's cardiac status is stable today. No change in her therapy is recommended today.  Willa Rough, MD, [facility] 06/29/2014 2:33 PM

## 2014-06-29 NOTE — Progress Notes (Addendum)
CRITICAL VALUE ALERT  Critical value received:  K 7.3   Date of notification:  06/29/14   Time of notification:  1030  Critical value read back:Yes.    Nurse who received alert:  Emelda Brothershristy Sarae Nicholes RN  MD notified (1st page):  Dr. Catha GosselinMikhail  Time of first page:  1031  MD notified (2nd page):  Time of second page:  Responding MD:  Dr. Catha GosselinMikhail  Time MD responded:  1034  Recollect Potassium level, slightly hemolyzed.

## 2014-06-29 NOTE — Progress Notes (Signed)
Talked with Dr Arlean HoppingSchertz. Ordered a stat 1 view CXR for tachypnea and wet breath sounds. Will continue to follow

## 2014-06-29 NOTE — Progress Notes (Signed)
Pt SPO2 after breathing treatment 83 to 90% on 4 LPM nasal cannula. Rapid Response in room. Changed sat probe and also moved to different finger SPO2 reading 86%. Pt in no distress. Placed on Venturi mask 50%. MD notified by Rapid Response. RN at bedside.

## 2014-06-29 NOTE — Progress Notes (Signed)
Hemodialysis- K resulted 6.2. Pt is on a 2K bath currently on HD. Continue to monitor.

## 2014-06-29 NOTE — Progress Notes (Signed)
PT Cancellation Note  Patient Details Name: [name] MRN: [id number] DOB: [birth year]   Cancelled Treatment:    Reason Eval/Treat Not Completed: Other (comment) (Refused due to HD at 1200.) Will f/u tomorrow.    [name], Mckennah Kretchmer F 06/29/2014, 3:07 PM  [employer] CorporationDawn Yi Falletta,PT Acute Rehabilitation [phone] (pager)

## 2014-06-29 NOTE — Consult Note (Signed)
Pt fistulogram reviewed.  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  She needs a few weeks to recover from her acute stroke.    Will arrange outpt visit with me to consider revision of fistula.  She also has protein calorie malnutrition and would benefit from protein supplements in order to improve wound healing at the time of operation.  Fabienne Brunsharles Haston Casebolt, MD [facility] [place] Office: [phone] Pager: [phone]

## 2014-06-29 NOTE — Progress Notes (Signed)
[facility] Progress Note  Assessment/Plan: 1. PSVT - intermittent runs - on BB and asa - cards following- had episode during HD 3/26 that spontaneously resolved- seems OK 2. CVA/cryptogenic stroke - acute ACA, left hemiparesis; neuro following - seen by EP = rec 30 day event recorder to arrange at d/c and outpt sleep study 3. ESRD - new start this admission (sister was [facility], prev on HD at [facility])- problems with AVF 3/25 - had fistulagram 3/21 without intervention - showed focal aneurysmal dilitation and competing venous outflow in the deep venous system - asked VVS to evaluate - only ran only 1.25 hr 3/25 - higher venous pressures - bruising from prior problems - access somewhat deep/tortuous . Bleeding issues with access 3/23 but not on 3/25 - . Evaluated by Dr. Hart Rochester - right IJ placed 3/26 - Dr. Darrick Penna to review f'gram next week and decide on revision - cattheter poorly functional  3/26 - cath flo placed overnight- will need to try HD again today via PC 4. Anemia - Hgb good no need for ESA - repleting Fe 5. Secondary hyperparathyroidism - iPTH 338 - 3/21, 531 3/18 - started hectorol 1- also on phoslo but not eating 6. HTN/volume - norvasc 10 and metoprolol 25 bid - given low BP's yest- will stop norvasc 7. Nutrition - alb 2.6 + vit 8. COPD/tobacco - encouraged smoking cessation. 9. S/p tx for Infuenza [name]/HIN1 10. Severe R HF per TEE  11. Renal mass per [place] 3/16 - "Indeterminate 1.9 cm hypoechoic mass off the interpolar region of the left kidney. 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 Recommend follow-up in the outpatient setting with eitherpre and post enhanced CT or MRI." 12. DM - per primary BS 70 - 120 13. Abdominal pain/cramping - new today - low grade temp 99.2 - no obvious etiology - follow 14. Increased WBC- pulm possibly vs steroids- is on big dose ? Could maybe be weaned ?  Per  primary team  [name]   06/29/2014, 9:08 AM    Subjective:  Events noted- unable to get good HD on Sat due to cathter malfunction and yesterday due to low BP's- Xray taken for low sats and "wet " sounding- doesn't show too much pulm edema- she is fatigued - increased WBC  Objective Filed Vitals:   06/28/14 2323 06/29/14 0355 06/29/14 0833 06/29/14 0855  BP: 105/76 104/76 127/87   Pulse: 91 94 88   Temp: 98.6 F (37 C) 98.7 F (37.1 C) 98.6 F (37 C)   TempSrc: Oral Axillary Axillary   Resp: 25 27    Height:      Weight:  105.9 kg (233 lb 7.5 oz)    SpO2: 100% 99% 91% 93%   Physical Exam General: ill uncomfortable- not exerting any effort to even move in bed- full tray untouched Heart: RRR Lungs: coarse BS clear some with cough Abdomen: soft + BS, epigastric tenderness -no rebound/guarding Extremities:1+ LE edema Dialysis Access: new right IJ and left upper AVF + bruit - bruising  Dialysis Orders: none - new start to HD  Additional Objective Labs: Basic Metabolic Panel:  Recent Labs Lab 06/24/14 1339  06/26/14 0511 06/27/14 0249 06/28/14 0400  NA 139  < > 134* 137 135  K 4.7  < > 5.4* 5.0 5.6*  CL 101  < > 98 101 99  CO2 27  < > GLUCOSE  75  < > 185* 196* 152*  BUN 75*  < > 78* 73* 77*  CREATININE 4.33*  < > 4.40* 4.32* 5.13*  CALCIUM 8.6  < > 8.4 8.2* 8.3*  PHOS 5.8*  --   --  5.6*  --   < > = values in this interval not displayed. Liver Function Tests:  Recent Labs Lab 06/24/14 1339 06/27/14 0249  ALBUMIN 2.6* 2.4*   CBC:  Recent Labs Lab 06/24/14 1338 06/25/14 0309 06/26/14 0511 06/27/14 0249 06/29/14 0505  WBC 11.9* 10.0 8.2 9.5 32.8*  HGB 12.5 12.3 12.4 12.2 13.5  HCT 38.8 38.8 39.5 38.5 41.5  MCV 80.7 82.4 82.0 82.6 81.2  PLT 299 233 255 276 298   CBG:  Recent Labs Lab 06/23/14 0825 06/23/14 1301 06/23/14 1659 06/24/14 0821 06/28/14 2226  GLUCAP 98 101* 121* 70 93  Medications: . sodium chloride 10  mL/hr at 06/27/14 0659   . amLODipine  10 mg Oral Daily  . antiseptic oral rinse  7 mL Mouth Rinse BID  . aspirin  325 mg Oral Daily  . atorvastatin  10 mg Oral q1800  . calcium acetate  667 mg Oral TID WC  . citalopram  20 mg Oral Daily  . [START ON 06/30/2014] doxercalciferol  1 mcg Intravenous Q T,Th,Sa-HD  . fenofibrate  160 mg Oral Daily  . ferric gluconate (FERRLECIT/NULECIT) IV  125 mg Intravenous Q M,W,F-HD  . heparin subcutaneous  5,000 Units Subcutaneous 3 times per day  . ipratropium-albuterol  3 mL Nebulization TID  . metoprolol tartrate  25 mg Oral BID  . multivitamin  1 tablet Oral QHS  . predniSONE  40 mg Oral BID WC

## 2014-06-29 NOTE — Procedures (Signed)
Patient was seen on dialysis and the procedure was supervised.  BFR 300  Via PC BP is  94/60.   Patient appears to be tolerating treatment well  [name] A 06/29/2014

## 2014-06-29 NOTE — Progress Notes (Signed)
[facility]                                                                              Patient Demographics  [name], is a 59 y.o. female, DOB - 06-Aug-1955, XBJ:[id number]  Admit date - 06/17/2014   Admitting Physician Lupita Leashouglas B McQuaid, MD  Outpatient Primary MD for the patient is August SaucerEAN, [name], MD  LOS - 12   Chief Complaint  Patient presents with  . Shortness of Breath  . Asthma  . Cough      HPI on Dr. Cyril Mourningakesh Alva (PCCM) on 06/17/2014 59 y/o female with CKD and hypertension [facility] on [facility]/16 complaining of shortness of breath and generalized weakness x 2 weeks. Worse with exhertion, also described chest tightness during these times. She noted not taking her diuretic medications for majority of that time period. RLE edema was "bad" but has now resolved. She denied fever but noted some mild chills. In the ED she was noted to be hypoxemic and in what is believed to be acute on chronic renal failure. She was given lasix and renal and critical care medicine were consulted. Currently she feels much better and is breathing comfortably on 100% NRB.   Interim history [facility] and found to have uncompensated metabolic acidosis from worsening uremia, H1N1 flu. Renal ultrasound done noted mass off of left kidney. Patient [facility] which greatly improved her breathing. On 3/18 patient noted to be hemiparetic on the left side and a CT scan noted an infarct of the right ACA and neurology consulted. Patient since stabilized and transferred to step down unit to hospitalist service. Patient's MRI notes focal areas of acute infarction throughout both hemispheres, consistent with an embolic phenomenon. Patient underwent stroke workup which had noted a negative TEE for clot, negative carotid Dopplers although she has had episodes of a flutter. She will likely need chronic long-term anticoagulation. Breathing continues to improve  and patient is down to approximately 3 L. She has continued to receive dialysis, followed by nephrology  Assessment & Plan   Acute respiratory failure with hypoxia -Patient continues to need nasal cannula -Chest x-ray: no acute cardiopulmonary disease -Repeat XRay 3/28: stable retrocardiac opacity, favor atelectasis -Likely multifactorial including influenza, chronic tobacco use with underlying COPD/volume overload -Patient was given several fluid boluses in dialysis last night due hypotension, she is currently +300cc  COPD with acute exacerbation -Possibly due to cor pulmonale -Continue steroids, taper down -Continue nebulizers, incentive spirometry   Acute CVA -MRI/MRA: Multifocal areas of acute infarction throughout both hemispheres, largest confluent area involving the right medial parasagittal frontal and posterior frontal cortex, distal ACA territory -TEE: Normal LV function, no LAA thrombus,severe RAE; mild RVE; severely reduced RV function; severe TR; bowing of atrial septum right to left; positive saline microcavitation study- PFO -Carotid Doppler: Bilateral 1-39% ICA stenosis, vertebral artery flow is antegrade -Lower extremity Doppler showed no SVT or DVT in bilateral lower extremities, Doppler waveforms are pulsatile suggestive of fluid overload -Echocardiogram: EF 60-65%, grade 1 diastolic dysfunction, severely elevated pulmonary pressure -LDL 77, hemoglobin A1c 6.5 -Neurology consulted and appreciated -Hypercoagulable workup: negative lupus anticoagulant antiphospholipid  antibody -Continue aspirin-full dose, statin -Possible need for loop recorder vs 30day event monitor, which will be arranged for by cardiology -PT and OT consulted and recommended CIR -CIR consulted- however due to ongoing care and issues with HD, would hold off at this time, may consider LTAC  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 -Appears in SR at this time -Patient had a run of SVT overnight-  asymptomatic -Cardiology consulted and appreciated- recommended 30 day event monitor, which will be arranged at discharge -Continue continue metoprolol -Patient does have severe RV and RA dysfunction and enlargement on echo- making her more likely to have Afib and other arrhythmias -Patient will likely need outpatient sleep study -Cardiology recommended outpatient lexiscan myoview; no good antiarrhythmic option other than amiodarone however currently not in atrial fibrillation and arrhythmia does not seem to be symptomatic -Possible aggravated by hyperkalemia- however, sample was hemolyzed, will obtain repeat.  Should correct with HD  End-stage renal disease on hemodialysis  -Nephrology consulted and appreciated -Fistulogram conducted showing dilatation of the cephalic vein beyond the arterial anastomosis -Patient unable to fully dialyze due to infiltration on 06/26/2014, or 3/27 due to hypotension -Vascular surgery consulted and recommended not using fistula, possible fistulogram next week with possible revision -Status post right internal jugular diatek catheter placement -Suspect patient may have dialysis today if able to tolerate  Abdominal discomfort  -Seems to have resolved, Unknown etiology -Cdiff PCR negative X2 -patient had a bowel movement early this morning -Denies nausea or vomiting -Xray negative -AST 40, ALT 47 (much lower than upon admission) -Continue PRN pain control and antiemetics  Chronic diastolic heart failure -Echocardiogram: EF 60-65%, grade 1 diastolic dysfunction -Continue to monitor intake, daily weights, hemodialysis -Patient was given several fluid boluses in dialysis last night due hypotension, she is currently +300cc  Hypertension -Hold antihyperternsives due hypotension in HD yesterday  Left renal mass -Found on renal ultrasound 06/18/2014, intermediate 1.9 cm hypoechoic mass of the interpolar region of the left kidney -Patient will need renal  MRI  Influenza, H1N1 -Patient completed course of Tamiflu 06/22/2014  Hyperlipidemia -Lipid profile: TC 156, TG 294, HDL 20, LDL 77 -Continue statin and fenofibrate, will monitor for signs of myalgia  Tobacco abuse -Smoking cessation counseling given  Depression -Continue Celexa  Obesity -BMI greater than 30 -Patient with primary care physician regarding lifestyle modifications upon discharge  Cool extremities -Doppler pulses after HD  Code Status: Full  Family Communication: None at bedside  Disposition Plan: Admitted, will attempt to dialyze today  Time Spent in minutes   30 minutes  Procedures  Echocardiogram TEE Carotid Doppler Renal ultrasound Lower extremity Doppler Placement of right internal jugular diatek catheter  Consults   PCCM Cardiology Neurology Nephrology Inpatient rehab Vascular surgery  DVT Prophylaxis  heparin  Lab Results  Component Value Date   PLT 298 06/29/2014    Medications  Scheduled Meds: . antiseptic oral rinse  7 mL Mouth Rinse BID  . aspirin  325 mg Oral Daily  . atorvastatin  10 mg Oral q1800  . calcium acetate  667 mg Oral TID WC  . citalopram  20 mg Oral Daily  . [START ON 06/30/2014] doxercalciferol  1 mcg Intravenous Q T,Th,Sa-HD  . fenofibrate  160 mg Oral Daily  . ferric gluconate (FERRLECIT/NULECIT) IV  125 mg Intravenous Q M,W,F-HD  . heparin subcutaneous  5,000 Units Subcutaneous 3 times per day  . ipratropium-albuterol  3 mL Nebulization TID  . metoprolol tartrate  25 mg Oral BID  . multivitamin  1 tablet Oral QHS  . predniSONE  40 mg Oral BID WC   Continuous Infusions: . sodium chloride 10 mL/hr at 06/27/14 0659   PRN Meds:.sodium chloride, sodium chloride, acetaminophen, albuterol, allopurinol, heparin, heparin, hydrOXYzine, metoprolol, ondansetron (ZOFRAN) IV, oxyCODONE  Antibiotics    Anti-infectives    Start     Dose/Rate Route Frequency Ordered Stop   06/27/14 0600  cefUROXime (ZINACEF) 1.5 g  in dextrose 5 % 50 mL IVPB     1.5 g 100 mL/hr over 30 Minutes Intravenous On call to O.R. 06/26/14 1019 06/27/14 0630   06/19/14 1800  oseltamivir (TAMIFLU) capsule 30 mg     30 mg Oral Every M-W-F (1800) 06/19/14 1003 06/22/14 1841   06/18/14 1600  oseltamivir (TAMIFLU) capsule 30 mg  Status:  Discontinued     30 mg Oral Daily 06/18/14 1538 06/19/14 1003        Subjective:   [name] seen and examined today.  Patient states she is "feeling ok."  Denies further  complains of abdominal pain and cramping, nausea or vomiting.    Objective:   Filed Vitals:   06/28/14 2323 06/29/14 0355 06/29/14 0833 06/29/14 0855  BP: 105/76 104/76 127/87   Pulse: 91 94 88   Temp: 98.6 F (37 C) 98.7 F (37.1 C) 98.6 F (37 C)   TempSrc: Oral Axillary Axillary   Resp: 25 27    Height:      Weight:  105.9 kg (233 lb 7.5 oz)    SpO2: 100% 99% 91% 93%    Wt Readings from Last 3 Encounters:  06/29/14 105.9 kg (233 lb 7.5 oz)  06/01/14 102.059 kg (225 lb)  01/28/14 104.327 kg (230 lb)     Intake/Output Summary (Last 24 hours) at 06/29/14 1240 Last data filed at 06/28/14 2141  Gross per 24 hour  Intake    100 ml  Output   -305 ml  Net    405 ml    Exam  General: Well developed, well nourished, mild distress  Cardiovascular: S1 S2 auscultated, no murmur, RRR  Respiratory: Clear to auscultation   Abdomen: Soft, obese, mildly tender, nondistended, + bowel sounds  Extremities: warm dry without cyanosis clubbing. LUE AVF, cool extremities  Neuro: AAOx3, nonfocal  Psych: flat affect  Data Review    Micro Results Recent Results (from the past 240 hour(s))  Clostridium Difficile by PCR     Status: None   Collection Time: 06/27/14  7:40 PM  Result Value Ref Range Status   C difficile by pcr NEGATIVE NEGATIVE Final    Radiology Reports Dg Chest 2 View  06/17/2014   CLINICAL DATA:  Shortness of breath and chest tenderness for 1 week. Oxygen dependent.  EXAM: CHEST  2 VIEW   COMPARISON:  Chest radiograph 01/28/2014.  FINDINGS: Increasing cardiomegaly. No focal infiltrates or congestive failure. No effusion or pneumothorax. Bones unremarkable.  IMPRESSION: Increasing cardiomegaly.  No active infiltrates or failure.   Electronically Signed   By: Davonna Belling M.D.   On: 06/17/2014 17:18   Ct Head Wo Contrast  06/19/2014   CLINICAL DATA:  Left-sided weakness.  Dialysis patient.  EXAM: CT HEAD WITHOUT CONTRAST  TECHNIQUE: Contiguous axial images were obtained from the base of the skull through the vertex without intravenous contrast.  COMPARISON:  None  FINDINGS: Ill-defined hypodensity in the right medial frontal lobe consistent with acute infarct in the right anterior cerebral artery territory. No associated hemorrhage.  Negative for chronic ischemia. Ventricle  size is normal. Negative for hemorrhage or mass lesion.  Calvarium intact.  Retention cyst in the left sphenoid sinus.  IMPRESSION: Acute infarct right anterior cerebral artery territory.  Critical Value/emergent results were called by telephone at the time of interpretation on 06/19/2014 at 12:53 pm to Dr. Billy Fischer , who verbally acknowledged these results.   Electronically Signed   By: Marlan Palau M.D.   On: 06/19/2014 12:53   Mr Maxine Glenn Head Wo Contrast  06/20/2014   CLINICAL DATA:  Shortness of breath and generalized weakness for 2 weeks. LEFT hemiparesis and LEFT facial droop of recent onset, but unspecified duration. History of chronic renal disease and hypertension. Acute respiratory failure. Abnormal CT head.  EXAM: MRI HEAD WITHOUT CONTRAST  MRA HEAD WITHOUT CONTRAST  TECHNIQUE: Multiplanar, multiecho pulse sequences of the brain and surrounding structures were obtained without intravenous contrast. Angiographic images of the head were obtained using MRA technique without contrast.  COMPARISON:  CT head performed 06/19/2014.  FINDINGS: MRI HEAD FINDINGS  Only a limited number of pulse sequences could be obtained on  the MRI exam. Diffusion axial and T1 sagittal images are interpreted.  Multiple areas of restricted diffusion throughout both hemispheres are identified consistent with acute infarction. The largest confluent area involves the RIGHT medial parasagittal frontal and posterior frontal cortex and subcortical white matter extending as far posteriorly as the central sulcus. Other smaller areas of restricted diffusion representing acute infarction can be seen in the LEFT anterior frontal cortex, LEFT parietal parasagittal cortex, and the LEFT parietal subcortical white matter.  No gross evidence for hemorrhage, mass lesion, hydrocephalus, or extra-axial fluid based on axial DWI. No pituitary enlargement or tonsillar herniation. Upper cervical region unremarkable. No osseous lesions.  MRA HEAD FINDINGS  Dolichoectatic but widely patent internal carotid arteries. Dolichoectatic and widely patent basilar artery. Both vertebrals contribute to basilar formation with the RIGHT dominant. There is no large vessel occlusion, proximal intracranial stenosis, or visible berry aneurysm. Moderately diseased anterior inferior cerebellar arteries bilaterally. No RIGHT PICA is visualized.  The distal RIGHT anterior cerebral artery in its pericallosal segment appears moderately diseased corresponding to the observed RIGHT ACA territory infarct.  IMPRESSION: Multifocal areas of acute infarction throughout both hemispheres, with the largest confluent area involving the RIGHT medial parasagittal frontal and posterior frontal cortex, distal ACA territory.  A full MRI examination could not be completed, therefore comprehensive assessment of intracranial abnormalities, including characterization of the observed acute infarcts, is not possible.  No proximal large vessel occlusion on MRA intracranial exam.   Electronically Signed   By: Davonna Belling M.D.   On: 06/20/2014 17:11   Mr Brain Wo Contrast  06/20/2014   CLINICAL DATA:  Shortness of  breath and generalized weakness for 2 weeks. LEFT hemiparesis and LEFT facial droop of recent onset, but unspecified duration. History of chronic renal disease and hypertension. Acute respiratory failure. Abnormal CT head.  EXAM: MRI HEAD WITHOUT CONTRAST  MRA HEAD WITHOUT CONTRAST  TECHNIQUE: Multiplanar, multiecho pulse sequences of the brain and surrounding structures were obtained without intravenous contrast. Angiographic images of the head were obtained using MRA technique without contrast.  COMPARISON:  CT head performed 06/19/2014.  FINDINGS: MRI HEAD FINDINGS  Only a limited number of pulse sequences could be obtained on the MRI exam. Diffusion axial and T1 sagittal images are interpreted.  Multiple areas of restricted diffusion throughout both hemispheres are identified consistent with acute infarction. The largest confluent area involves the RIGHT medial parasagittal frontal and posterior  frontal cortex and subcortical white matter extending as far posteriorly as the central sulcus. Other smaller areas of restricted diffusion representing acute infarction can be seen in the LEFT anterior frontal cortex, LEFT parietal parasagittal cortex, and the LEFT parietal subcortical white matter.  No gross evidence for hemorrhage, mass lesion, hydrocephalus, or extra-axial fluid based on axial DWI. No pituitary enlargement or tonsillar herniation. Upper cervical region unremarkable. No osseous lesions.  MRA HEAD FINDINGS  Dolichoectatic but widely patent internal carotid arteries. Dolichoectatic and widely patent basilar artery. Both vertebrals contribute to basilar formation with the RIGHT dominant. There is no large vessel occlusion, proximal intracranial stenosis, or visible berry aneurysm. Moderately diseased anterior inferior cerebellar arteries bilaterally. No RIGHT PICA is visualized.  The distal RIGHT anterior cerebral artery in its pericallosal segment appears moderately diseased corresponding to the  observed RIGHT ACA territory infarct.  IMPRESSION: Multifocal areas of acute infarction throughout both hemispheres, with the largest confluent area involving the RIGHT medial parasagittal frontal and posterior frontal cortex, distal ACA territory.  A full MRI examination could not be completed, therefore comprehensive assessment of intracranial abnormalities, including characterization of the observed acute infarcts, is not possible.  No proximal large vessel occlusion on MRA intracranial exam.   Electronically Signed   By: Davonna Belling M.D.   On: 06/20/2014 17:11   US Renal  06/18/2014   CLINICAL DATA:  Patient with acute renal failure.  EXAM: RENAL/URINARY TRACT ULTRASOUND COMPLETE  COMPARISON:  None.  FINDINGS: Right Kidney:  Length: 8.4 cm. No hydronephrosis. Renal cortex is diffusely increased in echogenicity.  Left Kidney:  Length: 9.1 cm. There is a 1.9 x 1.7 x 1.5 cm hypoechoic mass off of the interpolar region of the left kidney.  Bladder:  Poorly distended.  Ascites within the pelvis.  IMPRESSION: Indeterminate 1.9 cm hypoechoic mass off the interpolar region of the left kidney. Evaluation is markedly limited due to body habitus. This may potentially represent a cyst however solid mass is not excluded. Recommend follow-up in the outpatient setting with either pre and post enhanced CT or MRI.  Echogenic right kidney most compatible with chronic medical renal disease.  No hydronephrosis.  Fluid within the pelvis.  These results will be called to the ordering clinician or representative by the Radiologist Assistant, and communication documented in the PACS or zVision Dashboard.   Electronically Signed   By: Annia Belt M.D.   On: 06/18/2014 13:20   Dg Chest Port 1 View  06/29/2014   CLINICAL DATA:  Tachypnea  EXAM: PORTABLE CHEST - 1 VIEW  COMPARISON:  Two days ago  FINDINGS: Right IJ central line is in similar position, with the split tips at the SVC level.  Unchanged cardiopericardial enlargement.   Persistent retrocardiac opacity favors atelectasis given streaky appearance and volume loss. No air leak or significant effusion.  IMPRESSION: Stable retrocardiac opacity, favor atelectasis.   Electronically Signed   By: Marnee Spring M.D.   On: 06/29/2014 07:09   Dg Chest Port 1 View  06/27/2014   CLINICAL DATA:  Status post dialysis catheter insertion. Initial encounter.  EXAM: PORTABLE CHEST - 1 VIEW  COMPARISON:  Chest radiograph performed 06/20/2014  FINDINGS: The patient's right-sided dual-lumen catheter is seen ending overlying the mid SVC.  Left basilar airspace opacity likely reflects atelectasis. A small left pleural effusion is suspected. No pneumothorax is seen.  The cardiomediastinal silhouette is mildly enlarged. No acute osseous abnormalities are identified.  IMPRESSION: 1. Right-sided dual-lumen catheter is seen ending overlying  the mid SVC. 2. Left basilar airspace opacity likely reflects atelectasis. Suspect small left pleural effusion. 3. Mild cardiomegaly.   Electronically Signed   By: Roanna Raider M.D.   On: 06/27/2014 09:49   Dg Chest Port 1 View  06/20/2014   CLINICAL DATA:  Hypoxia  EXAM: PORTABLE CHEST - 1 VIEW  COMPARISON:  06/17/2014  FINDINGS: Stable mild cardiomegaly. No mediastinal or hilar masses or evidence of adenopathy.  Clear lungs.  No pleural effusion or pneumothorax.  IMPRESSION: No acute cardiopulmonary disease. Stable appearance from the prior exam.   Electronically Signed   By: Amie Portland M.D.   On: 06/20/2014 09:08   Dg Abd 2 Views  06/28/2014   CLINICAL DATA:  Abdominal pain  EXAM: ABDOMEN - 2 VIEW  COMPARISON:  No similar prior exam is available at this institution for comparison or on [facility].  FINDINGS: The bowel gas pattern is normal. There is no evidence of free air. No radio-opaque calculi or other significant radiographic abnormality is seen. Gaseous gastric distention is noted. Coarse calcification over the pelvis likely indicates a fibroid.   IMPRESSION: Negative.   Electronically Signed   By: Christiana Pellant M.D.   On: 06/28/2014 13:37   Dg Fluoro Guide Cv Line-no Report  06/27/2014   CLINICAL DATA:    FLOURO GUIDE CV LINE  Fluoroscopy was utilized by the requesting physician.  No radiographic  interpretation.    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  EXAM: DIALYSIS AV FISTULOGRAM  COMPARISON:  None.  CONTRAST:  50mL OMNIPAQUE IOHEXOL 300 MG/ML  SOLN  FLUOROSCOPY TIME:  24 seconds.  PROCEDURE: The procedure, risks, benefits, and alternatives were explained to the patient. Questions regarding the procedure were encouraged and answered. The patient understands and consents to the procedure.  The left arm native fistula was prepped with Betadine in a sterile fashion, and a sterile drape was applied covering the operative field. A diagnostic fistulogram was performed via an 18 gauge angiocatheter introduced into venous outflow. Venous drainage was assessed to the level of the central veins in the chest. Proximal fistula was studied by reflux maneuver with temporary compression of venous outflow. After the procedure, the angiocatheter was removed and hemostasis obtained with manual compression.  COMPLICATIONS: None  FINDINGS: Antecubital anastomosis between the brachial artery and cephalic vein is normally patent. After a short segment, there is focal aneurysmal dilatation of the vein in the antecubital fossa. There is competing venous outflow noted in the cephalic vein as well as the deep venous system. The cephalic vein is tortuous but shows no significant focal stenoses. Central veins are normally patent, including the SVC.  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 There is competing venous outflow in the deep venous system of the upper  arm.  ACCESS: No percutaneous intervention was indicated today.   Electronically Signed   By: Irish Lack M.D.   On: 06/22/2014 13:20    CBC  Recent Labs Lab 06/24/14 1338 06/25/14 0309 06/26/14 0511 06/27/14 0249 06/29/14 0505  WBC 11.9* 10.0 8.2 9.5 32.8*  HGB 12.5 12.3 12.4 12.2 13.5  HCT 38.8 38.8 39.5 38.5 41.5  PLT 299 233 255 276 298  MCV 80.7 82.4 82.0 82.6 81.2  MCH 26.0 26.1 25.7* 26.2 26.4  MCHC 32.2 31.7 31.4 31.7 32.5  RDW 18.7* 18.8* 18.9* 18.9* 19.0*    Chemistries   Recent Labs Lab 06/24/14 1334  06/25/14 0309 06/26/14 0511 06/26/14 0731 06/27/14 0249 06/28/14 0400 06/29/14 0810  NA  --   < > 137 134*  --  137 135 131*  K  --   < > 5.2* 5.4*  --  5.0 5.6* 7.3*  CL  --   < > 100 98  --  101 99 92*  CO2  --   < > 25 25  --  27 25 20   GLUCOSE  --   < > 113* 185*  --  196* 152* 116*  BUN  --   < > 54* 78*  --  73* 77* 84*  CREATININE  --   < > 3.39* 4.40*  --  4.32* 5.13* 6.15*  CALCIUM  --   < > 8.1* 8.4  --  8.2* 8.3* 9.2  MG 2.0  --   --   --  2.0  --   --   --   AST  --   --   --   --   --   --   --  40*  ALT  --   --   --   --   --   --   --  47*  ALKPHOS  --   --   --   --   --   --   --  72  BILITOT  --   --   --   --   --   --   --  2.2*  < > = values in this interval not displayed. ------------------------------------------------------------------------------------------------------------------ estimated creatinine clearance is 12.1 mL/min (by C-G formula based on Cr of 6.15). ------------------------------------------------------------------------------------------------------------------ No results for input(s): HGBA1C in the last 72 hours. ------------------------------------------------------------------------------------------------------------------ No results for input(s): CHOL, HDL, LDLCALC, TRIG, CHOLHDL, LDLDIRECT in the last 72  hours. ------------------------------------------------------------------------------------------------------------------ No results for input(s): TSH, T4TOTAL, T3FREE, THYROIDAB in the last 72 hours.  Invalid input(s): FREET3 ------------------------------------------------------------------------------------------------------------------ No results for input(s): VITAMINB12, FOLATE, FERRITIN, TIBC, IRON, RETICCTPCT in the last 72 hours.  Coagulation profile  Recent Labs Lab 06/27/14 0249  INR 1.23    No results for input(s): DDIMER in the last 72 hours.  Cardiac Enzymes No results for input(s): CKMB, TROPONINI, MYOGLOBIN in the last 168 hours.  Invalid input(s): CK ------------------------------------------------------------------------------------------------------------------ Invalid input(s): POCBNP    Aliou Mealey D.O. on 06/29/2014 at 12:40 PM  Between 7am to 7pm - Pager - [phone]  After 7pm go to [email or web address] - password [email or web address]  And look for the night coverage person covering for me after hours  [facility] Office  [phone]

## 2014-06-30 ENCOUNTER — Telehealth: Payer: Self-pay | Admitting: Vascular Surgery

## 2014-06-30 LAB — CBC
HCT: 37.9 % (ref 36.0–46.0)
HEMOGLOBIN: 12.4 g/dL (ref 12.0–15.0)
MCH: 26.9 pg (ref 26.0–34.0)
MCHC: 32.7 g/dL (ref 30.0–36.0)
MCV: 82.2 fL (ref 78.0–100.0)
Platelets: 210 10*3/uL (ref 150–400)
RBC: 4.61 MIL/uL (ref 3.87–5.11)
RDW: 19.8 % — ABNORMAL HIGH (ref 11.5–15.5)
WBC: 32.4 10*3/uL — ABNORMAL HIGH (ref 4.0–10.5)

## 2014-06-30 LAB — BASIC METABOLIC PANEL
Anion gap: 12 (ref 5–15)
BUN: 52 mg/dL — ABNORMAL HIGH (ref 6–23)
CALCIUM: 8.6 mg/dL (ref 8.4–10.5)
CHLORIDE: 98 mmol/L (ref 96–112)
CO2: 28 mmol/L (ref 19–32)
Creatinine, Ser: 4.81 mg/dL — ABNORMAL HIGH (ref 0.50–1.10)
GFR calc Af Amer: 11 mL/min — ABNORMAL LOW (ref >90)
GFR, EST NON AFRICAN AMERICAN: 9 mL/min — AB (ref >90)
GLUCOSE: 56 mg/dL — AB (ref 70–99)
Potassium: >7.5 mmol/L (ref 3.5–5.1)
Sodium: 138 mmol/L (ref 135–145)

## 2014-06-30 LAB — POCT I-STAT, CHEM 8
BUN: 32 mg/dL — AB (ref 6–23)
CALCIUM ION: 1.2 mmol/L (ref 1.12–1.23)
CHLORIDE: 97 mmol/L (ref 96–112)
CREATININE: 3.4 mg/dL — AB (ref 0.50–1.10)
Glucose, Bld: 104 mg/dL — ABNORMAL HIGH (ref 70–99)
HCT: 47 % — ABNORMAL HIGH (ref 36.0–46.0)
HEMOGLOBIN: 16 g/dL — AB (ref 12.0–15.0)
POTASSIUM: 3.6 mmol/L (ref 3.5–5.1)
SODIUM: 140 mmol/L (ref 135–145)
TCO2: 23 mmol/L (ref 0–100)

## 2014-06-30 LAB — POTASSIUM: Potassium: 3.4 mmol/L — ABNORMAL LOW (ref 3.5–5.1)

## 2014-06-30 MED ORDER — DOCUSATE SODIUM 100 MG PO CAPS
200.0000 mg | ORAL_CAPSULE | Freq: Two times a day (BID) | ORAL | Status: DC
  Administered 2014-07-01 (×2): 200 mg via ORAL
  Filled 2014-06-30 (×2): qty 2

## 2014-06-30 MED ORDER — BISACODYL 5 MG PO TBEC
10.0000 mg | DELAYED_RELEASE_TABLET | Freq: Once | ORAL | Status: AC
  Administered 2014-07-01: 10 mg via ORAL
  Filled 2014-06-30: qty 2

## 2014-06-30 MED ORDER — NEPRO/CARBSTEADY PO LIQD
237.0000 mL | Freq: Three times a day (TID) | ORAL | Status: DC
  Administered 2014-06-30 – 2014-07-01 (×2): 237 mL via ORAL
  Filled 2014-06-30 (×7): qty 237

## 2014-06-30 MED ORDER — SODIUM CHLORIDE 0.9 % IV SOLN
1.0000 g | Freq: Once | INTRAVENOUS | Status: AC
  Administered 2014-06-30: 1 g via INTRAVENOUS
  Filled 2014-06-30: qty 10

## 2014-06-30 MED ORDER — DEXTROSE 5 % IV SOLN
2.0000 g | Freq: Once | INTRAVENOUS | Status: AC
  Administered 2014-06-30: 2 g via INTRAVENOUS
  Filled 2014-06-30: qty 2

## 2014-06-30 MED ORDER — OXYCODONE HCL 5 MG PO TABS
5.0000 mg | ORAL_TABLET | Freq: Once | ORAL | Status: AC
  Administered 2014-06-30: 5 mg via ORAL
  Filled 2014-06-30: qty 1

## 2014-06-30 MED ORDER — PREDNISONE 20 MG PO TABS: 40.0000 mg | ORAL_TABLET | Freq: Every day | ORAL | Status: DC

## 2014-06-30 MED ORDER — HEPARIN SODIUM (PORCINE) 1000 UNIT/ML DIALYSIS
20.0000 [IU]/kg | INTRAMUSCULAR | Status: DC | PRN
  Filled 2014-06-30: qty 3

## 2014-06-30 MED ORDER — SODIUM POLYSTYRENE SULFONATE 15 GM/60ML PO SUSP
30.0000 g | Freq: Once | ORAL | Status: AC
  Administered 2014-06-30: 30 g via ORAL
  Filled 2014-06-30: qty 120

## 2014-06-30 MED ORDER — INSULIN ASPART 100 UNIT/ML ~~LOC~~ SOLN
10.0000 [IU] | Freq: Once | SUBCUTANEOUS | Status: AC
  Administered 2014-06-30: 10 [IU] via SUBCUTANEOUS

## 2014-06-30 MED ORDER — DEXTROSE 50 % IV SOLN
1.0000 | Freq: Once | INTRAVENOUS | Status: AC
  Administered 2014-06-30: 50 mL via INTRAVENOUS

## 2014-06-30 MED ORDER — VANCOMYCIN HCL 10 G IV SOLR
2000.0000 mg | Freq: Once | INTRAVENOUS | Status: AC
  Administered 2014-06-30: 2000 mg via INTRAVENOUS
  Filled 2014-06-30: qty 2000

## 2014-06-30 MED ORDER — OXYCODONE HCL 5 MG PO TABS
ORAL_TABLET | ORAL | Status: AC
  Administered 2014-06-30: 5 mg
  Filled 2014-06-30: qty 1

## 2014-06-30 MED ORDER — DEXTROSE 50 % IV SOLN
INTRAVENOUS | Status: AC
  Filled 2014-06-30: qty 50

## 2014-06-30 MED ORDER — HEPARIN SODIUM (PORCINE) 1000 UNIT/ML DIALYSIS
20.0000 [IU]/kg | INTRAMUSCULAR | Status: DC | PRN
  Filled 2014-06-30: qty 2

## 2014-06-30 MED ORDER — VANCOMYCIN HCL IN DEXTROSE 1-5 GM/200ML-% IV SOLN
1000.0000 mg | Freq: Once | INTRAVENOUS | Status: AC
  Administered 2014-06-30: 1000 mg via INTRAVENOUS
  Filled 2014-06-30: qty 200

## 2014-06-30 NOTE — Progress Notes (Signed)
Tele shows NSR, occasional PACs. No sustained arrhythmias. Nothing to add today. Will follow tele. Dayna Dunn PA-C

## 2014-06-30 NOTE — Progress Notes (Signed)
ANTIBIOTIC CONSULT NOTE - INITIAL  Pharmacy Consult for vancomycin and cefepime Indication: rule out pneumonia and rule out sepsis  No Known Allergies  Patient Measurements: Height:  (165.1 cm) Weight: 220 lb 3.8 oz (99.9 kg) IBW/kg (Calculated) : 57  Vital Signs: Temp: 98.5 F (36.9 C) (03/29 0010) Temp Source: Axillary (03/29 0010) BP: 112/79 mmHg (03/29 0010) Pulse Rate: 99 (03/29 0010)  Labs:  Recent Labs  06/27/14 0249 06/28/14 0400 06/29/14 0505 06/29/14 0810  WBC 9.5  --  32.8*  --   HGB 12.2  --  13.5  --   PLT 276  --  298  --   CREATININE 4.32* 5.13*  --  6.15*   Estimated Creatinine Clearance: 11.7 mL/min (by C-G formula based on Cr of 6.15).   Microbiology: Recent Results (from the past 720 hour(s))  MRSA PCR Screening     Status: None   Collection Time: 06/17/14  9:29 PM  Result Value Ref Range Status   MRSA by PCR NEGATIVE NEGATIVE Final    Comment:        The GeneXpert MRSA Assay (FDA approved for NASAL specimens only), is one component of a comprehensive MRSA colonization surveillance program. It is not intended to diagnose MRSA infection nor to guide or monitor treatment for MRSA infections.   Clostridium Difficile by PCR     Status: None   Collection Time: 06/18/14  3:52 AM  Result Value Ref Range Status   C difficile by pcr NEGATIVE NEGATIVE Final  Clostridium Difficile by PCR     Status: None   Collection Time: 06/27/14  7:40 PM  Result Value Ref Range Status   C difficile by pcr NEGATIVE NEGATIVE Final    Medical History: Past Medical History  Diagnosis Date  . Asthma   . Hypertension   . Gout   . Arthritis   . Insomnia   . Chronic kidney disease   . Depression     Medications:  Prescriptions prior to admission  Medication Sig Dispense Refill Last Dose  . allopurinol (ZYLOPRIM) 100 MG tablet Take 100 mg by mouth 2 (two) times daily as needed (for gout).   unknown  . citalopram (CELEXA) 20 MG tablet Take 20 mg by  mouth daily.   Past Month at Unknown time  . furosemide (LASIX) 80 MG tablet Take 80 mg by mouth daily.   Past Week at Unknown time  . hydrOXYzine (ATARAX/VISTARIL) 25 MG tablet Take 25 mg by mouth 3 (three) times daily as needed for anxiety.   unknown  . oxyCODONE (ROXICODONE) 5 MG immediate release tablet Take 1 tablet (5 mg total) by mouth every 6 (six) hours as needed for severe pain. (Patient not taking: Reported on 06/17/2014) 15 tablet 0 Taking   Scheduled:  . antiseptic oral rinse  7 mL Mouth Rinse BID  . aspirin  325 mg Oral Daily  . atorvastatin  10 mg Oral q1800  . calcium acetate  667 mg Oral TID WC  . citalopram  20 mg Oral Daily  . doxercalciferol  1 mcg Intravenous Q T,Th,Sa-HD  . fenofibrate  160 mg Oral Daily  . ferric gluconate (FERRLECIT/NULECIT) IV  125 mg Intravenous Q M,W,F-HD  . heparin subcutaneous  5,000 Units Subcutaneous 3 times per day  . ipratropium-albuterol  3 mL Nebulization TID  . metoprolol tartrate  25 mg Oral BID  . multivitamin  1 tablet Oral QHS  . predniSONE  40 mg Oral BID WC   Infusions:  .  sodium chloride 10 mL/hr at 06/27/14 [id number]    Assessment: 58yo [facility], [facility] the next day, now on floor but w/ compromised O2 sats after breathing tx, new CXR shows possible PNA vs edema, to begin IV ABX.  Goal of Therapy:  Pre-HD vanc level 15-25  Plan:  Will give vancomycin 2000mg  IV x1 followed by 1000mg  IV after each HD as well as cefepime 2g IV now and after HD and monitor CBC, Cx, levels prn.  Vernard GamblesVeronda Sanda Dejoy, PharmD, BCPS  06/30/2014,12:38 AM

## 2014-06-30 NOTE — Progress Notes (Signed)
Cancellation OT Note  Patient currently off floor for dialysis. Will check back as able.   Edwin CapPatricia Loreena Valeri , MS, OTR/L, CLT Pager: [phone]

## 2014-06-30 NOTE — Procedures (Signed)
Patient was seen on dialysis and the procedure was supervised.  BFR 300  Via PC BP is  127/106.   Patient appears to be tolerating treatment well- hd today for high K on zero K bath and follow up   [name] A 06/30/2014

## 2014-06-30 NOTE — Progress Notes (Signed)
[facility]                                                                              Patient Demographics  [name], is a 59 y.o. female, DOB - November 17, 1955, NWG:[id number]  Admit date - 06/17/2014   Admitting Physician Lupita Leashouglas B McQuaid, MD  Outpatient Primary MD for the patient is August SaucerEAN, [name], MD  LOS - 13   Chief Complaint  Patient presents with  . Shortness of Breath  . Asthma  . Cough      HPI on Dr. Cyril Mourningakesh Alva (PCCM) on 06/17/2014 59 y/o female with CKD and hypertension [facility] on 3/16 complaining of shortness of breath and generalized weakness x 2 weeks. Worse with exhertion, also described chest tightness during these times. She noted not taking her diuretic medications for majority of that time period. RLE edema was "bad" but has now resolved. She denied fever but noted some mild chills. In the ED she was noted to be hypoxemic and in what is believed to be acute on chronic renal failure. She was given lasix and renal and critical care medicine were consulted. Currently she feels much better and is breathing comfortably on 100% NRB.   Interim history [facility] and found to have uncompensated metabolic acidosis from worsening uremia, H1N1 flu. Renal ultrasound done noted mass off of left kidney. [facility] which greatly improved her breathing. On 3/18 patient noted to be hemiparetic on the left side and a CT scan noted an infarct of the right ACA and neurology consulted. Patient since stabilized and transferred to step down unit to hospitalist service. Patient's MRI notes focal areas of acute infarction throughout both hemispheres, consistent with an embolic phenomenon. Patient underwent stroke workup which had noted a negative TEE for clot, negative carotid Dopplers although she has had episodes of a flutter.   Continues to need [place].  Likely volume overloaded, has not been able to fully dialyze  in several days and even received IVF during HD due to hypotension.  Developed leukocytosis.  Will like need LTAC.   Assessment & Plan   Acute respiratory failure with hypoxia/ COPD Exacerbation -Patient continues to need nasal cannula -Chest x-ray: no acute cardiopulmonary disease -Repeat XRay 3/28: stable retrocardiac opacity, favor atelectasis -Likely multifactorial including influenza, chronic tobacco use with underlying COPD/volume overload -Spoke with nephrology, likely due to volume overload as patient has not really been able to dialyze fully for several days -Will dialyze today and likely tomorrow  COPD with acute exacerbation -Possibly due to cor pulmonale -Continue steroids, taper down -Continue nebulizers, incentive spirometry  Leukocytosis -Possibly related to volume overload vs ?pneumonia vs steroids -Placed on vanc and cefepime -Patient treated earlier this hospital course for influenxa -Will decrease prednisone -Patient currently afebrile -Cdiff PCR neg x2 -CXR: retrocardiac airspace opacification may reflect asymmetric pulmonary edema or pneumonia -Continue to monitor CBC   Acute CVA -MRI/MRA: Multifocal areas of acute infarction throughout both hemispheres, largest confluent area involving the right medial parasagittal frontal and posterior frontal cortex, distal ACA territory -TEE: Normal LV function, no LAA thrombus,severe RAE; mild RVE; severely reduced RV  function; severe TR; bowing of atrial septum right to left; positive saline microcavitation study- PFO -Carotid Doppler: Bilateral 1-39% ICA stenosis, vertebral artery flow is antegrade -Lower extremity Doppler showed no SVT or DVT in bilateral lower extremities, Doppler waveforms are pulsatile suggestive of fluid overload -Echocardiogram: EF 60-65%, grade 1 diastolic dysfunction, severely elevated pulmonary pressure -LDL 77, hemoglobin A1c 6.5 -Neurology consulted and appreciated -Hypercoagulable workup:  negative lupus anticoagulant antiphospholipid antibody -Continue aspirin-full dose, statin -Possible need for loop recorder vs 30day event monitor, which will be arranged for by cardiology -PT and OT consulted and recommended CIR -CIR consulted- however due to ongoing care and issues with HD, would hold off at this time, may consider LTAC  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 -Appears in SR at this time -Patient had a run of SVT overnight- asymptomatic -Cardiology consulted and appreciated- recommended 30 day event monitor, which will be arranged at discharge -Continue continue metoprolol -Patient does have severe RV and RA dysfunction and enlargement on echo- making her more likely to have Afib and other arrhythmias -Patient will likely need outpatient sleep study -Cardiology recommended outpatient lexiscan myoview; no good antiarrhythmic option other than amiodarone however currently not in atrial fibrillation and arrhythmia does not seem to be symptomatic -Possible aggravated by hyperkalemia- however, sample was hemolyzed, will obtain repeat.  Should correct with HD  Hyperkalemia -Continue HD -Given Kayexalate, insulin, D50, calcium gluconate -?lab error- rechecked 3.4 during dialysis  End-stage renal disease on hemodialysis  -Nephrology consulted and appreciated -Fistulogram conducted showing dilatation of the cephalic vein beyond the arterial anastomosis -Patient unable to fully dialyze due to infiltration on 06/26/2014, or 3/27 due to hypotension -Vascular surgery consulted and recommended not using fistula, possible fistulogram next week with possible revision- will need outpatient follow up with Dr. Darrick Penna in 2-3 weeks. -Status post right internal jugular diatek catheter placement -Suspect patient may have dialysis today if able to tolerate  Abdominal discomfort  -Seems to have resolved, Unknown etiology -Cdiff PCR negative X2 -Denies nausea or vomiting -Xray negative -AST 40, ALT 47  (much lower than upon admission) -Continue PRN pain control and antiemetics  Chronic diastolic heart failure -Echocardiogram: EF 60-65%, grade 1 diastolic dysfunction -Continue to monitor intake, daily weights, hemodialysis -Patient was given several fluid boluses in dialysis last night due hypotension, she is currently +300cc  Hypertension -Hold antihyperternsives due hypotension in HD yesterday  Left renal mass -Found on renal ultrasound 06/18/2014, intermediate 1.9 cm hypoechoic mass of the interpolar region of the left kidney -Patient will need renal MRI  Influenza, H1N1 -Patient completed course of Tamiflu 06/22/2014  Hyperlipidemia -Lipid profile: TC 156, TG 294, HDL 20, LDL 77 -Continue statin and fenofibrate, will monitor for signs of myalgia  Tobacco abuse -Smoking cessation counseling given  Depression -Continue Celexa  Obesity -BMI greater than 30 -Patient with primary care physician regarding lifestyle modifications upon discharge  Code Status: Full  Family Communication: None at bedside  Disposition Plan: Admitted, Dialyzed today, and will likely tomorrow 3/30.  Looking into LTAC  Time Spent in minutes   30 minutes  Procedures  Echocardiogram TEE Carotid Doppler Renal ultrasound Lower extremity Doppler Placement of right internal jugular diatek catheter  Consults   PCCM Cardiology Neurology Nephrology Inpatient rehab Vascular surgery  DVT Prophylaxis  heparin  Lab Results  Component Value Date   PLT 210 06/30/2014    Medications  Scheduled Meds: . antiseptic oral rinse  7 mL Mouth Rinse BID  . aspirin  325 mg Oral Daily  .  atorvastatin  10 mg Oral q1800  . calcium acetate  667 mg Oral TID WC  . ceFEPime (MAXIPIME) IV  2 g Intravenous Once  . citalopram  20 mg Oral Daily  . doxercalciferol  1 mcg Intravenous Q T,Th,Sa-HD  . fenofibrate  160 mg Oral Daily  . ferric gluconate (FERRLECIT/NULECIT) IV  125 mg Intravenous Q M,W,F-HD  .  heparin subcutaneous  5,000 Units Subcutaneous 3 times per day  . ipratropium-albuterol  3 mL Nebulization TID  . metoprolol tartrate  25 mg Oral BID  . multivitamin  1 tablet Oral QHS  . [START ON 07/01/2014] predniSONE  40 mg Oral Q breakfast  . vancomycin  1,000 mg Intravenous Once   Continuous Infusions: . sodium chloride 10 mL/hr at 06/27/14 0659   PRN Meds:.sodium chloride, sodium chloride, acetaminophen, albuterol, allopurinol, heparin, heparin, heparin, heparin, hydrOXYzine, metoprolol, ondansetron (ZOFRAN) IV, oxyCODONE  Antibiotics    Anti-infectives    Start     Dose/Rate Route Frequency Ordered Stop   06/30/14 1200  vancomycin (VANCOCIN) IVPB 1000 mg/200 mL premix     1,000 mg 200 mL/hr over 60 Minutes Intravenous  Once 06/30/14 0944     06/30/14 1200  ceFEPIme (MAXIPIME) 2 g in dextrose 5 % 50 mL IVPB     2 g 100 mL/hr over 30 Minutes Intravenous  Once 06/30/14 0944     06/30/14 0100  vancomycin (VANCOCIN) 2,000 mg in sodium chloride 0.9 % 500 mL IVPB     2,000 mg 250 mL/hr over 120 Minutes Intravenous  Once 06/30/14 0048 06/30/14 0525   06/30/14 0100  ceFEPIme (MAXIPIME) 2 g in dextrose 5 % 50 mL IVPB     2 g 100 mL/hr over 30 Minutes Intravenous  Once 06/30/14 0048 06/30/14 0322   06/27/14 0600  cefUROXime (ZINACEF) 1.5 g in dextrose 5 % 50 mL IVPB     1.5 g 100 mL/hr over 30 Minutes Intravenous On call to O.R. 06/26/14 1019 06/27/14 0630   06/19/14 1800  oseltamivir (TAMIFLU) capsule 30 mg     30 mg Oral Every M-W-F (1800) 06/19/14 1003 06/22/14 1841   06/18/14 1600  oseltamivir (TAMIFLU) capsule 30 mg  Status:  Discontinued     30 mg Oral Daily 06/18/14 1538 06/19/14 1003        Subjective:   Regan Nanna seen and examined today.  Patient states she is "feeling ok" and feels she is improving slightly. Denies further  complains of abdominal pain and cramping, nausea or vomiting.    Objective:   Filed Vitals:   06/30/14 1130 06/30/14 1200 06/30/14 1230  06/30/14 1300  BP: 100/68 97/66 105/54 99/51  Pulse: 92 50 102 107  Temp:      TempSrc:      Resp:      Height:      Weight:      SpO2:        Wt Readings from Last 3 Encounters:  06/30/14 101.1 kg (222 lb 14.2 oz)  06/01/14 102.059 kg (225 lb)  01/28/14 104.327 kg (230 lb)     Intake/Output Summary (Last 24 hours) at 06/30/14 1329 Last data filed at 06/30/14 1000  Gross per 24 hour  Intake    720 ml  Output   1450 ml  Net   -730 ml    Exam  General: Well developed, well nourished  Cardiovascular: S1 S2 auscultated, no murmur, RRR  Respiratory: Coarse breath sounds, occ cough  Abdomen: Soft, obese, mildly  tender, nondistended, + bowel sounds  Extremities: warm dry without cyanosis clubbing. Trace LE edema.  LUE AVF.    Data Review    Micro Results Recent Results (from the past 240 hour(s))  Clostridium Difficile by PCR     Status: None   Collection Time: 06/27/14  7:40 PM  Result Value Ref Range Status   C difficile by pcr NEGATIVE NEGATIVE Final  Culture, blood (routine x 2)     Status: None (Preliminary result)   Collection Time: 06/29/14  8:10 AM  Result Value Ref Range Status   Specimen Description BLOOD RIGHT ANTECUBITAL  Final   Special Requests BOTTLES DRAWN AEROBIC ONLY 3CC  Final   Culture   Final           BLOOD CULTURE RECEIVED NO GROWTH TO DATE CULTURE WILL BE HELD FOR 5 DAYS BEFORE ISSUING A FINAL NEGATIVE REPORT Performed at [facility]    Report Status PENDING  Incomplete  Culture, blood (routine x 2)     Status: None (Preliminary result)   Collection Time: 06/29/14  8:15 AM  Result Value Ref Range Status   Specimen Description BLOOD RIGHT HAND  Final   Special Requests BOTTLES DRAWN AEROBIC ONLY 2CC  Final   Culture   Final           BLOOD CULTURE RECEIVED NO GROWTH TO DATE CULTURE WILL BE HELD FOR 5 DAYS BEFORE ISSUING A FINAL NEGATIVE REPORT Note: Culture results may be compromised due to an inadequate volume of blood received  in culture bottles. Performed at [facility]    Report Status PENDING  Incomplete    Radiology Reports Dg Chest 2 View  06/17/2014   CLINICAL DATA:  Shortness of breath and chest tenderness for 1 week. Oxygen dependent.  EXAM: CHEST  2 VIEW  COMPARISON:  Chest radiograph 01/28/2014.  FINDINGS: Increasing cardiomegaly. No focal infiltrates or congestive failure. No effusion or pneumothorax. Bones unremarkable.  IMPRESSION: Increasing cardiomegaly.  No active infiltrates or failure.   Electronically Signed   By: Davonna Belling M.D.   On: 06/17/2014 17:18   Ct Head Wo Contrast  06/19/2014   CLINICAL DATA:  Left-sided weakness.  Dialysis patient.  EXAM: CT HEAD WITHOUT CONTRAST  TECHNIQUE: Contiguous axial images were obtained from the base of the skull through the vertex without intravenous contrast.  COMPARISON:  None  FINDINGS: Ill-defined hypodensity in the right medial frontal lobe consistent with acute infarct in the right anterior cerebral artery territory. No associated hemorrhage.  Negative for chronic ischemia. Ventricle size is normal. Negative for hemorrhage or mass lesion.  Calvarium intact.  Retention cyst in the left sphenoid sinus.  IMPRESSION: Acute infarct right anterior cerebral artery territory.  Critical Value/emergent results were called by telephone at the time of interpretation on 06/19/2014 at 12:53 pm to Dr. Billy Fischer , who verbally acknowledged these results.   Electronically Signed   By: Marlan Palau M.D.   On: 06/19/2014 12:53   Mr Maxine Glenn Head Wo Contrast  06/20/2014   CLINICAL DATA:  Shortness of breath and generalized weakness for 2 weeks. LEFT hemiparesis and LEFT facial droop of recent onset, but unspecified duration. History of chronic renal disease and hypertension. Acute respiratory failure. Abnormal CT head.  EXAM: MRI HEAD WITHOUT CONTRAST  MRA HEAD WITHOUT CONTRAST  TECHNIQUE: Multiplanar, multiecho pulse sequences of the brain and surrounding structures were  obtained without intravenous contrast. Angiographic images of the head were obtained using MRA technique without  contrast.  COMPARISON:  CT head performed 06/19/2014.  FINDINGS: MRI HEAD FINDINGS  Only a limited number of pulse sequences could be obtained on the MRI exam. Diffusion axial and T1 sagittal images are interpreted.  Multiple areas of restricted diffusion throughout both hemispheres are identified consistent with acute infarction. The largest confluent area involves the RIGHT medial parasagittal frontal and posterior frontal cortex and subcortical white matter extending as far posteriorly as the central sulcus. Other smaller areas of restricted diffusion representing acute infarction can be seen in the LEFT anterior frontal cortex, LEFT parietal parasagittal cortex, and the LEFT parietal subcortical white matter.  No gross evidence for hemorrhage, mass lesion, hydrocephalus, or extra-axial fluid based on axial DWI. No pituitary enlargement or tonsillar herniation. Upper cervical region unremarkable. No osseous lesions.  MRA HEAD FINDINGS  Dolichoectatic but widely patent internal carotid arteries. Dolichoectatic and widely patent basilar artery. Both vertebrals contribute to basilar formation with the RIGHT dominant. There is no large vessel occlusion, proximal intracranial stenosis, or visible berry aneurysm. Moderately diseased anterior inferior cerebellar arteries bilaterally. No RIGHT PICA is visualized.  The distal RIGHT anterior cerebral artery in its pericallosal segment appears moderately diseased corresponding to the observed RIGHT ACA territory infarct.  IMPRESSION: Multifocal areas of acute infarction throughout both hemispheres, with the largest confluent area involving the RIGHT medial parasagittal frontal and posterior frontal cortex, distal ACA territory.  A full MRI examination could not be completed, therefore comprehensive assessment of intracranial abnormalities, including  characterization of the observed acute infarcts, is not possible.  No proximal large vessel occlusion on MRA intracranial exam.   Electronically Signed   By: Davonna Belling M.D.   On: 06/20/2014 17:11   Mr Brain Wo Contrast  06/20/2014   CLINICAL DATA:  Shortness of breath and generalized weakness for 2 weeks. LEFT hemiparesis and LEFT facial droop of recent onset, but unspecified duration. History of chronic renal disease and hypertension. Acute respiratory failure. Abnormal CT head.  EXAM: MRI HEAD WITHOUT CONTRAST  MRA HEAD WITHOUT CONTRAST  TECHNIQUE: Multiplanar, multiecho pulse sequences of the brain and surrounding structures were obtained without intravenous contrast. Angiographic images of the head were obtained using MRA technique without contrast.  COMPARISON:  CT head performed 06/19/2014.  FINDINGS: MRI HEAD FINDINGS  Only a limited number of pulse sequences could be obtained on the MRI exam. Diffusion axial and T1 sagittal images are interpreted.  Multiple areas of restricted diffusion throughout both hemispheres are identified consistent with acute infarction. The largest confluent area involves the RIGHT medial parasagittal frontal and posterior frontal cortex and subcortical white matter extending as far posteriorly as the central sulcus. Other smaller areas of restricted diffusion representing acute infarction can be seen in the LEFT anterior frontal cortex, LEFT parietal parasagittal cortex, and the LEFT parietal subcortical white matter.  No gross evidence for hemorrhage, mass lesion, hydrocephalus, or extra-axial fluid based on axial DWI. No pituitary enlargement or tonsillar herniation. Upper cervical region unremarkable. No osseous lesions.  MRA HEAD FINDINGS  Dolichoectatic but widely patent internal carotid arteries. Dolichoectatic and widely patent basilar artery. Both vertebrals contribute to basilar formation with the RIGHT dominant. There is no large vessel occlusion, proximal  intracranial stenosis, or visible berry aneurysm. Moderately diseased anterior inferior cerebellar arteries bilaterally. No RIGHT PICA is visualized.  The distal RIGHT anterior cerebral artery in its pericallosal segment appears moderately diseased corresponding to the observed RIGHT ACA territory infarct.  IMPRESSION: Multifocal areas of acute infarction throughout both hemispheres, with the largest  confluent area involving the RIGHT medial parasagittal frontal and posterior frontal cortex, distal ACA territory.  A full MRI examination could not be completed, therefore comprehensive assessment of intracranial abnormalities, including characterization of the observed acute infarcts, is not possible.  No proximal large vessel occlusion on MRA intracranial exam.   Electronically Signed   By: Davonna Belling M.D.   On: 06/20/2014 17:11   US Renal  06/18/2014   CLINICAL DATA:  Patient with acute renal failure.  EXAM: RENAL/URINARY TRACT ULTRASOUND COMPLETE  COMPARISON:  None.  FINDINGS: Right Kidney:  Length: 8.4 cm. No hydronephrosis. Renal cortex is diffusely increased in echogenicity.  Left Kidney:  Length: 9.1 cm. There is a 1.9 x 1.7 x 1.5 cm hypoechoic mass off of the interpolar region of the left kidney.  Bladder:  Poorly distended.  Ascites within the pelvis.  IMPRESSION: Indeterminate 1.9 cm hypoechoic mass off the interpolar region of the left kidney. Evaluation is markedly limited due to body habitus. This may potentially represent a cyst however solid mass is not excluded. Recommend follow-up in the outpatient setting with either pre and post enhanced CT or MRI.  Echogenic right kidney most compatible with chronic medical renal disease.  No hydronephrosis.  Fluid within the pelvis.  These results will be called to the ordering clinician or representative by the Radiologist Assistant, and communication documented in the PACS or zVision Dashboard.   Electronically Signed   By: Annia Belt M.D.   On: 06/18/2014  13:20   Dg Chest Port 1 View  06/29/2014   CLINICAL DATA:  Acute onset of shortness of breath. Initial encounter.  EXAM: PORTABLE CHEST - 1 VIEW  COMPARISON:  Chest radiograph performed earlier today at 6:47 a.m.  FINDINGS: The lungs are relatively well expanded. Persistent retrocardiac airspace opacification may reflect asymmetric pulmonary edema or pneumonia. Underlying vascular congestion is again noted. A small left pleural effusion is suspected. No pneumothorax is seen.  The cardiomediastinal silhouette is borderline enlarged. A right-sided dual-lumen catheter is seen ending overlying the mid SVC. No acute osseous abnormalities are seen.  IMPRESSION: Persistent retrocardiac airspace opacification may reflect asymmetric pulmonary edema or pneumonia. Underlying vascular congestion and borderline cardiomegaly again noted. Small left pleural effusion suspected.   Electronically Signed   By: Roanna Raider M.D.   On: 06/29/2014 23:03   Dg Chest Port 1 View  06/29/2014   CLINICAL DATA:  Tachypnea  EXAM: PORTABLE CHEST - 1 VIEW  COMPARISON:  Two days ago  FINDINGS: Right IJ central line is in similar position, with the split tips at the SVC level.  Unchanged cardiopericardial enlargement.  Persistent retrocardiac opacity favors atelectasis given streaky appearance and volume loss. No air leak or significant effusion.  IMPRESSION: Stable retrocardiac opacity, favor atelectasis.   Electronically Signed   By: Marnee Spring M.D.   On: 06/29/2014 07:09   Dg Chest Port 1 View  06/27/2014   CLINICAL DATA:  Status post dialysis catheter insertion. Initial encounter.  EXAM: PORTABLE CHEST - 1 VIEW  COMPARISON:  Chest radiograph performed 06/20/2014  FINDINGS: The patient's right-sided dual-lumen catheter is seen ending overlying the mid SVC.  Left basilar airspace opacity likely reflects atelectasis. A small left pleural effusion is suspected. No pneumothorax is seen.  The cardiomediastinal silhouette is mildly  enlarged. No acute osseous abnormalities are identified.  IMPRESSION: 1. Right-sided dual-lumen catheter is seen ending overlying the mid SVC. 2. Left basilar airspace opacity likely reflects atelectasis. Suspect small left pleural effusion. 3. Mild  cardiomegaly.   Electronically Signed   By: Roanna Raider M.D.   On: 06/27/2014 09:49   Dg Chest Port 1 View  06/20/2014   CLINICAL DATA:  Hypoxia  EXAM: PORTABLE CHEST - 1 VIEW  COMPARISON:  06/17/2014  FINDINGS: Stable mild cardiomegaly. No mediastinal or hilar masses or evidence of adenopathy.  Clear lungs.  No pleural effusion or pneumothorax.  IMPRESSION: No acute cardiopulmonary disease. Stable appearance from the prior exam.   Electronically Signed   By: Amie Portland M.D.   On: 06/20/2014 09:08   Dg Abd 2 Views  06/28/2014   CLINICAL DATA:  Abdominal pain  EXAM: ABDOMEN - 2 VIEW  COMPARISON:  No similar prior exam is available at this institution for comparison or on [facility].  FINDINGS: The bowel gas pattern is normal. There is no evidence of free air. No radio-opaque calculi or other significant radiographic abnormality is seen. Gaseous gastric distention is noted. Coarse calcification over the pelvis likely indicates a fibroid.  IMPRESSION: Negative.   Electronically Signed   By: Christiana Pellant M.D.   On: 06/28/2014 13:37   Dg Fluoro Guide Cv Line-no Report  06/27/2014   CLINICAL DATA:    FLOURO GUIDE CV LINE  Fluoroscopy was utilized by the requesting physician.  No radiographic  interpretation.    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  EXAM: DIALYSIS AV FISTULOGRAM  COMPARISON:  None.  CONTRAST:  50mL OMNIPAQUE IOHEXOL 300 MG/ML  SOLN  FLUOROSCOPY TIME:  24 seconds.  PROCEDURE: The procedure, risks, benefits, and alternatives were explained to the patient. Questions regarding the procedure were encouraged and answered. The patient  understands and consents to the procedure.  The left arm native fistula was prepped with Betadine in a sterile fashion, and a sterile drape was applied covering the operative field. A diagnostic fistulogram was performed via an 18 gauge angiocatheter introduced into venous outflow. Venous drainage was assessed to the level of the central veins in the chest. Proximal fistula was studied by reflux maneuver with temporary compression of venous outflow. After the procedure, the angiocatheter was removed and hemostasis obtained with manual compression.  COMPLICATIONS: None  FINDINGS: Antecubital anastomosis between the brachial artery and cephalic vein is normally patent. After a short segment, there is focal aneurysmal dilatation of the vein in the antecubital fossa. There is competing venous outflow noted in the cephalic vein as well as the deep venous system. The cephalic vein is tortuous but shows no significant focal stenoses. Central veins are normally patent, including the SVC.  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 There is competing venous outflow in the deep venous system of the upper arm.  ACCESS: No percutaneous intervention was indicated today.   Electronically Signed   By: Irish Lack M.D.   On: 06/22/2014 13:20    CBC  Recent Labs Lab 06/25/14 0309 06/26/14 0511 06/27/14 0249 06/29/14 0505 06/30/14 0358 06/30/14 1101  WBC 10.0 8.2 9.5 32.8* 32.4*  --   HGB 12.3 12.4 12.2 13.5 12.4 16.0*  HCT 38.8 39.5 38.5 41.5 37.9 47.0*  PLT 233 255 276 298 210  --   MCV 82.4 82.0 82.6 81.2 82.2  --   MCH 26.1 25.7* 26.2 26.4 26.9  --   MCHC 31.7 31.4 31.7 32.5 32.7  --  RDW 18.8* 18.9* 18.9* 19.0* 19.8*  --     Chemistries   Recent Labs Lab 06/24/14 1334  06/26/14 0511 06/26/14 0731 06/27/14 0249 06/28/14 0400 06/29/14 0810 06/29/14 1200 06/30/14 0358 06/30/14 1101  06/30/14 1115  NA  --   < > 134*  --  137 135 131*  --  138 140  --   K  --   < > 5.4*  --  5.0 5.6* 7.3* 6.2* >7.5* 3.6 3.4*  CL  --   < > 98  --  101 99 92*  --  98 97  --   CO2  --   < > 25  --  --  28  --   --   GLUCOSE  --   < > 185*  --  196* 152* 116*  --  56* 104*  --   BUN  --   < > 78*  --  73* 77* 84*  --  52* 32*  --   CREATININE  --   < > 4.40*  --  4.32* 5.13* 6.15*  --  4.81* 3.40*  --   CALCIUM  --   < > 8.4  --  8.2* 8.3* 9.2  --  8.6  --   --   MG 2.0  --   --  2.0  --   --   --   --   --   --   --   AST  --   --   --   --   --   --  40*  --   --   --   --   ALT  --   --   --   --   --   --  47*  --   --   --   --   ALKPHOS  --   --   --   --   --   --  72  --   --   --   --   BILITOT  --   --   --   --   --   --  2.2*  --   --   --   --   < > = values in this interval not displayed. ------------------------------------------------------------------------------------------------------------------ estimated creatinine clearance is 21.2 mL/min (by C-G formula based on Cr of 3.4). ------------------------------------------------------------------------------------------------------------------ No results for input(s): HGBA1C in the last 72 hours. ------------------------------------------------------------------------------------------------------------------ No results for input(s): CHOL, HDL, LDLCALC, TRIG, CHOLHDL, LDLDIRECT in the last 72 hours. ------------------------------------------------------------------------------------------------------------------ No results for input(s): TSH, T4TOTAL, T3FREE, THYROIDAB in the last 72 hours.  Invalid input(s): FREET3 ------------------------------------------------------------------------------------------------------------------ No results for input(s): VITAMINB12, FOLATE, FERRITIN, TIBC, IRON, RETICCTPCT in the last 72 hours.  Coagulation profile  Recent Labs Lab 06/27/14 0249  INR 1.23    No results for  input(s): DDIMER in the last 72 hours.  Cardiac Enzymes No results for input(s): CKMB, TROPONINI, MYOGLOBIN in the last 168 hours.  Invalid input(s): CK ------------------------------------------------------------------------------------------------------------------ Invalid input(s): POCBNP    Matias Thurman D.O. on 06/30/2014 at 1:29 PM  Between 7am to 7pm - Pager - [phone]  After 7pm go to [email or web address] - password [email or web address]  And look for the night coverage person covering for me after hours  [facility] Office  [phone]

## 2014-06-30 NOTE — Progress Notes (Signed)
PT Cancellation Note  Patient Details Name: [name] MRN: [id number] DOB: 01/05/1956   Cancelled Treatment:    Reason Eval/Treat Not Completed: Patient at procedure or test/unavailable (in Hemodialysis)   [name], [name] 06/30/2014, 12:01 PM [name],PT Acute Rehabilitation [phone] (pager)

## 2014-06-30 NOTE — Progress Notes (Signed)
Patient seen while rounding. Found resting in bed in no respiratory distress, po2 83-85% on 4 LNC. Respiratory therapist at bedside placed Pt on VM 50% with sats improving to 91-93%. Lung sounds diminished. [facility] paged and updated per floor RN. ABG and CXR ordered. RRT to follow patient tonight. RN advised to monitor Pt closely and update provider with test results. Pt left resting in bed.

## 2014-06-30 NOTE — Progress Notes (Signed)
Rehab admissions - I spoke with Dr. Catha GosselinMikhail yesterday.  Patient not medically ready for acute inpatient rehab admission.  I will continue to follow progress.  Call me for questions.  #[phone]

## 2014-06-30 NOTE — Progress Notes (Signed)
INITIAL NUTRITION ASSESSMENT  DOCUMENTATION CODES Per approved criteria  -Obesity Unspecified   INTERVENTION: Provide Nepro Shake po TID, each supplement provides 425 kcal and 19 grams protein.  Encourage PO intake.  NUTRITION DIAGNOSIS: Inadequate oral intake  related to abdominal pains as evidenced by meal completion of 0-50%.   Goal: Pt to meet >/= 90% of their estimated nutrition needs   Monitor:  PO intake, weight trends, labs, I/O's  Reason for Assessment: MD consult for assessment of nutrition requirements/status  59 y.o. female  Admitting Dx: Acute respiratory failure with hypoxia  ASSESSMENT: Pt with CKD and hypertension presents with of shortness of breath and generalized weakness x 2 weeks. Pt found to have metabolic acidosis from worsening uremia, H1N1 flu. Renal ultrasound done noted mass off of left kidney. Pt started on HD. 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  Pt reports having a decreased appetite from having abdominal pains. Meal completion has been 0-50%.Pt refused breakfast this AM. Pt reports PTA she had a good appetite and was eating fine with no other difficulties. Weight has been stable. Pt is agreeable to Nepro Shake to aid in caloric and protein needs. RD to order. Pt was encouraged to eat her food at meals.   Pt with no observed significant fat or muscle mass loss.   Labs: Low potassium. High BUN and creatinine.  Height: Ht Readings from Last 1 Encounters:  06/26/14  (1.651 m)    Weight: Wt Readings from Last 1 Encounters:  06/30/14 219 lb 1.6 oz (99.383 kg)    Ideal Body Weight: 125 lbs  % Ideal Body Weight: 175%  Wt Readings from Last 10 Encounters:  06/30/14 219 lb 1.6 oz (99.383 kg)  06/01/14 225 lb (102.059 kg)  01/28/14 230 lb (104.327 kg)  01/21/14 231 lb 1.6 oz (104.826 kg)  10/02/12 216 lb 8 oz (98.204 kg)    Usual Body Weight: 220 lbs  % Usual Body  Weight: 99%  BMI:  Body mass index is 36.46 kg/(m^2). Class II obesity  Adjusted body weight: 91 kg  Estimated Nutritional Needs: Kcal: 2000-2200 Protein: 105-120 grams Fluid: 1.2 L/day  Skin: +1 LUE, LLE edema  Diet Order: Diet renal with fluid restriction Fluid restriction:: 1200 mL Fluid; Room service appropriate?: Yes; Fluid consistency:: Thin  EDUCATION NEEDS: -No education needs identified at this time   Intake/Output Summary (Last 24 hours) at 06/30/14 0954 Last data filed at 06/30/14 0659  Gross per 24 hour  Intake    720 ml  Output   1450 ml  Net   -730 ml    Last BM: 3/27  Labs:   Recent Labs Lab 06/24/14 1334 06/24/14 1339  06/26/14 0731 06/27/14 0249 06/28/14 0400 06/29/14 0810 06/29/14 1200 06/30/14 0358  NA  --  139  < >  --  137 135 131*  --  138  K  --  4.7  < >  --  5.0 5.6* 7.3* 6.2* >7.5*  CL  --  101  < >  --  101 99 92*  --  98  CO2  --  27  < >  --  --  28  BUN  --  75*  < >  --  73* 77* 84*  --  52*  CREATININE  --  4.33*  < >  --  4.32* 5.13* 6.15*  --  4.81*  CALCIUM  --  8.6  < >  --  8.2* 8.3* 9.2  --  8.6  MG 2.0  --   --  2.0  --   --   --   --   --   PHOS  --  5.8*  --   --  5.6*  --   --   --   --   GLUCOSE  --  75  < >  --  196* 152* 116*  --  56*  < > = values in this interval not displayed.  CBG (last 3)   Recent Labs  06/28/14 2226  GLUCAP 93    Scheduled Meds: . antiseptic oral rinse  7 mL Mouth Rinse BID  . aspirin  325 mg Oral Daily  . atorvastatin  10 mg Oral q1800  . calcium acetate  667 mg Oral TID WC  . calcium gluconate  1 g Intravenous Once  . ceFEPime (MAXIPIME) IV  2 g Intravenous Once  . citalopram  20 mg Oral Daily  . doxercalciferol  1 mcg Intravenous Q T,Th,Sa-HD  . fenofibrate  160 mg Oral Daily  . ferric gluconate (FERRLECIT/NULECIT) IV  125 mg Intravenous Q M,W,F-HD  . heparin subcutaneous  5,000 Units Subcutaneous 3 times per day  . ipratropium-albuterol  3 mL Nebulization TID  .  metoprolol tartrate  25 mg Oral BID  . multivitamin  1 tablet Oral QHS  . predniSONE  40 mg Oral BID WC  . vancomycin  1,000 mg Intravenous Once    Continuous Infusions: . sodium chloride 10 mL/hr at 06/27/14 [id number]    Past Medical History  Diagnosis Date  . Asthma   . Hypertension   . Gout   . Arthritis   . Insomnia   . Chronic kidney disease   . Depression     Past Surgical History  Procedure Laterality Date  . Multiple tooth extractions    . Av fistula placement Left 01/28/2014    Procedure: ARTERIOVENOUS (AV) FISTULA CREATION;  Surgeon: Sherren Kernsharles E Fields, MD;  [facility];  Service: Vascular;  Laterality: Left;  . Tee without cardioversion N/A 06/23/2014    Procedure: TRANSESOPHAGEAL ECHOCARDIOGRAM (TEE);  Surgeon: Lewayne BuntingBrian S Crenshaw, MD;  [facility];  Service: Cardiovascular;  Laterality: N/A;  . Insertion of dialysis catheter Right 06/27/2014    Procedure: INSERTION OF Right Internal Jugular DIATEK CATHETER;  Surgeon: Pryor OchoaJames D Lawson, MD;  [facility];  Service: Vascular;  Laterality: Right;    Marijean NiemannStephanie La, MS, RD, LDN Pager # [phone] After hours/ weekend pager # [phone]

## 2014-06-30 NOTE — Progress Notes (Signed)
[facility] Progress Note  Assessment/Plan: 1. PSVT - intermittent runs - on BB and asa - cards following- had episode during HD 3/26 that spontaneously resolved- seems OK 2. CVA/cryptogenic stroke - acute ACA, left hemiparesis; neuro following - seen by EP = rec 30 day event recorder to arrange at d/c and outpt sleep study 3. ESRD - new start this admission (sister [facility] problems with AVF 3/25 - had fistulagram 3/21 without intervention - showed focal aneurysmal dilitation and competing venous outflow in the deep venous system -VVS wanting to wait for her to recover before revision of AVF is considered -  - right IJ placed 3/26 - - catheter poorly functional  3/26, better 3/28 - will need to do HD again today via PC on 1 K bath 4. Anemia - Hgb good no need for ESA - repleting Fe 5. Secondary hyperparathyroidism - iPTH 338 - 3/21, 531 3/18 - started hectorol 1- also on phoslo but not eating 6. HTN/volume -  metoprolol 25 bid - given low BP's yest- have stopped norvasc- seems like can use more UF- will try for 3-4 liters with HD today  7. Nutrition - alb 2.6 + vit 8. COPD/tobacco - encouraged smoking cessation. 9. S/p tx for Infuenza A/HIN1 10. Severe R HF per TEE  11. Renal mass per US 3/16 - "Indeterminate 1.9 cm hypoechoic mass off the interpolar region of the left kidney. Evaluation is markedly limited due to body habitus.This may potentially represent a cyst however solid mass is not excluded. Recommend follow-up in the outpatient setting with eitherpre and post enhanced CT or MRI." 12. DM - per primary BS 70 - 120 14. Increased WBC- pulm possibly vs steroids- is on big dose ? Could maybe be weaned ?  Contributing to high K ? Per primary team  [place]omin[place] Kathan A   06/30/2014, 8:41 AM    Subjective:  Events noted- had respiratory difficulty overnight- had HD with 300 BFR- ran on 1 K bath but K is greater than 7.5 this AM??? Strange  because BUN and creatinine did go down appropriately   Objective Filed Vitals:   06/29/14 2324 06/30/14 0010 06/30/14 0028 06/30/14 0532  BP: 125/77 112/79  126/61  Pulse: 98 99  85  Temp:  98.5 F (36.9 C)  97.8 F (36.6 C)  TempSrc:  Axillary  Oral  Resp: 19 17  22   Height:      Weight:    99.383 kg (219 lb 1.6 oz)  SpO2: 96% 98% 93% 91%   Physical Exam General: ill uncomfortable- not exerting any effort to even move in bed- full tray untouched Heart: RRR Lungs: coarse BS clear some with cough Abdomen: soft + BS, epigastric tenderness -no rebound/guarding Extremities:1+ LE edema Dialysis Access: new right IJ and left upper AVF + bruit - bruising  Dialysis Orders: none - new start to HD  Additional Objective Labs: Basic Metabolic Panel:  Recent Labs Lab 06/24/14 1339  06/27/14 0249 06/28/14 0400 06/29/14 0810 06/29/14 1200 06/30/14 0358  NA 139  < > 137 135 131*  --  138  K 4.7  < > 5.0 5.6* 7.3* 6.2* >7.5*  CL 101  < > 101 99 92*  --  98  CO2 27  < > 27 25 20   --  28  GLUCOSE 75  < > 196* 152* 116*  --  56*  BUN 75*  < > 73* 77* 84*  --  52*  CREATININE 4.33*  < > 4.32* 5.13* 6.15*  --  4.81*  CALCIUM 8.6  < > 8.2* 8.3* 9.2  --  8.6  PHOS 5.8*  --  5.6*  --   --   --   --   < > = values in this interval not displayed. Liver Function Tests:  Recent Labs Lab 06/24/14 1339 06/27/14 0249 06/29/14 0810  AST  --   --  40*  ALT  --   --  47*  ALKPHOS  --   --  72  BILITOT  --   --  2.2*  PROT  --   --  6.1  ALBUMIN 2.6* 2.4* 2.5*   CBC:  Recent Labs Lab 06/25/14 0309 06/26/14 0511 06/27/14 0249 06/29/14 0505 06/30/14 0358  WBC 10.0 8.2 9.5 32.8* 32.4*  HGB 12.3 12.4 12.2 13.5 12.4  HCT 38.8 39.5 38.5 41.5 37.9  MCV 82.4 82.0 82.6 81.2 82.2  PLT 233 255 276 298 210   CBG:  Recent Labs Lab 06/23/14 1301 06/23/14 1659 06/24/14 0821 06/28/14 2226  GLUCAP 101* 121* 70 93  Medications: . sodium chloride 10 mL/hr at 06/27/14 0659   .  antiseptic oral rinse  7 mL Mouth Rinse BID  . aspirin  325 mg Oral Daily  . atorvastatin  10 mg Oral q1800  . calcium acetate  667 mg Oral TID WC  . calcium gluconate  1 g Intravenous Once  . citalopram  20 mg Oral Daily  . dextrose  1 ampule Intravenous Once  . doxercalciferol  1 mcg Intravenous Q T,Th,Sa-HD  . fenofibrate  160 mg Oral Daily  . ferric gluconate (FERRLECIT/NULECIT) IV  125 mg Intravenous Q M,W,F-HD  . heparin subcutaneous  5,000 Units Subcutaneous 3 times per day  . insulin aspart  10 Units Subcutaneous Once  . ipratropium-albuterol  3 mL Nebulization TID  . metoprolol tartrate  25 mg Oral BID  . multivitamin  1 tablet Oral QHS  . predniSONE  40 mg Oral BID WC  . sodium polystyrene  30 g Oral Once

## 2014-06-30 NOTE — Telephone Encounter (Addendum)
-----   Message from Sherren Kernsharles E Fields, MD sent at 06/29/2014  1:17 PM EDT ----- Pt needs outpt appt with me in 2-3 weeks to consider revision of her fistula.  Fabienne BrunsCharles Fields   06/30/14: LM for pt re appt asked for CB to confirm, dpm

## 2014-07-01 ENCOUNTER — Inpatient Hospital Stay (HOSPITAL_COMMUNITY): Payer: Medicaid Other

## 2014-07-01 ENCOUNTER — Encounter (HOSPITAL_COMMUNITY)
Admission: EM | Disposition: A | Discharge status: Rehabilitation Facility | Payer: Self-pay | Source: Home / Self Care | Attending: Internal Medicine

## 2014-07-01 ENCOUNTER — Encounter (HOSPITAL_COMMUNITY): Payer: Self-pay | Admitting: General Surgery

## 2014-07-01 ENCOUNTER — Inpatient Hospital Stay (HOSPITAL_COMMUNITY): Payer: Medicaid Other | Admitting: Anesthesiology

## 2014-07-01 DIAGNOSIS — K255 Chronic or unspecified gastric ulcer with perforation: Secondary | ICD-10-CM | POA: Diagnosis present

## 2014-07-01 DIAGNOSIS — J189 Pneumonia, unspecified organism: Secondary | ICD-10-CM

## 2014-07-01 DIAGNOSIS — R0902 Hypoxemia: Secondary | ICD-10-CM

## 2014-07-01 HISTORY — PX: BOWEL RESECTION: SHX1257

## 2014-07-01 HISTORY — PX: LAPAROTOMY: SHX154

## 2014-07-01 LAB — COMPREHENSIVE METABOLIC PANEL
ALK PHOS: 105 U/L (ref 39–117)
ALK PHOS: 59 U/L (ref 39–117)
ALT: 64 U/L — ABNORMAL HIGH (ref 0–35)
ALT: 414 U/L — AB (ref 0–35)
AST: 91 U/L — AB (ref 0–37)
AST: 1352 U/L — AB (ref 0–37)
Albumin: 2.5 g/dL — ABNORMAL LOW (ref 3.5–5.2)
Albumin: 2.3 g/dL — ABNORMAL LOW (ref 3.5–5.2)
Anion gap: 15 (ref 5–15)
Anion gap: 16 — ABNORMAL HIGH (ref 5–15)
BUN: 36 mg/dL — ABNORMAL HIGH (ref 6–23)
BUN: 55 mg/dL — AB (ref 6–23)
CHLORIDE: 98 mmol/L (ref 96–112)
CO2: 27 mmol/L (ref 19–32)
CO2: 22 mmol/L (ref 19–32)
CREATININE: 5.17 mg/dL — AB (ref 0.50–1.10)
Calcium: 8.9 mg/dL (ref 8.4–10.5)
Calcium: 8 mg/dL — ABNORMAL LOW (ref 8.4–10.5)
Chloride: 102 mmol/L (ref 96–112)
Creatinine, Ser: 4.41 mg/dL — ABNORMAL HIGH (ref 0.50–1.10)
GFR calc non Af Amer: 10 mL/min — ABNORMAL LOW (ref >90)
GFR calc non Af Amer: 8 mL/min — ABNORMAL LOW (ref >90)
GFR, EST AFRICAN AMERICAN: 12 mL/min — AB (ref >90)
GFR, EST AFRICAN AMERICAN: 10 mL/min — AB (ref >90)
GLUCOSE: 92 mg/dL (ref 70–99)
Glucose, Bld: 145 mg/dL — ABNORMAL HIGH (ref 70–99)
POTASSIUM: 5.8 mmol/L — AB (ref 3.5–5.1)
POTASSIUM: 4.7 mmol/L (ref 3.5–5.1)
Sodium: 140 mmol/L (ref 135–145)
Sodium: 140 mmol/L (ref 135–145)
TOTAL PROTEIN: 4.5 g/dL — AB (ref 6.0–8.3)
Total Bilirubin: 2.4 mg/dL — ABNORMAL HIGH (ref 0.3–1.2)
Total Bilirubin: 2.2 mg/dL — ABNORMAL HIGH (ref 0.3–1.2)
Total Protein: 6.7 g/dL (ref 6.0–8.3)

## 2014-07-01 LAB — CBC WITH DIFFERENTIAL/PLATELET
BAND NEUTROPHILS: 0 % (ref 0–10)
BASOS ABS: 0 10*3/uL (ref 0.0–0.1)
BASOS PCT: 0 % (ref 0–1)
BASOS PCT: 0 % (ref 0–1)
BLASTS: 0 %
BLASTS: 0 %
Band Neutrophils: 0 % (ref 0–10)
Basophils Absolute: 0 10*3/uL (ref 0.0–0.1)
EOS ABS: 0 10*3/uL (ref 0.0–0.7)
EOS PCT: 0 % (ref 0–5)
EOS PCT: 0 % (ref 0–5)
Eosinophils Absolute: 0 10*3/uL (ref 0.0–0.7)
HCT: 41.7 % (ref 36.0–46.0)
HCT: 32.9 % — ABNORMAL LOW (ref 36.0–46.0)
Hemoglobin: 13.4 g/dL (ref 12.0–15.0)
Hemoglobin: 10.5 g/dL — ABNORMAL LOW (ref 12.0–15.0)
Lymphocytes Relative: 8 % — ABNORMAL LOW (ref 12–46)
Lymphocytes Relative: 21 % (ref 12–46)
Lymphs Abs: 1.6 10*3/uL (ref 0.7–4.0)
Lymphs Abs: 2.5 10*3/uL (ref 0.7–4.0)
MCH: 26.5 pg (ref 26.0–34.0)
MCH: 26.3 pg (ref 26.0–34.0)
MCHC: 32.1 g/dL (ref 30.0–36.0)
MCHC: 31.9 g/dL (ref 30.0–36.0)
MCV: 82.6 fL (ref 78.0–100.0)
MCV: 82.5 fL (ref 78.0–100.0)
MONOS PCT: 4 % (ref 3–12)
MYELOCYTES: 0 %
Metamyelocytes Relative: 0 %
Metamyelocytes Relative: 0 %
Monocytes Absolute: 0.8 10*3/uL (ref 0.1–1.0)
Monocytes Absolute: 0.7 10*3/uL (ref 0.1–1.0)
Monocytes Relative: 6 % (ref 3–12)
Myelocytes: 0 %
NEUTROS ABS: 17.8 10*3/uL — AB (ref 1.7–7.7)
NEUTROS ABS: 8.6 10*3/uL — AB (ref 1.7–7.7)
Neutrophils Relative %: 88 % — ABNORMAL HIGH (ref 43–77)
Neutrophils Relative %: 73 % (ref 43–77)
Platelets: 154 10*3/uL (ref 150–400)
Platelets: 125 10*3/uL — ABNORMAL LOW (ref 150–400)
Promyelocytes Absolute: 0 %
Promyelocytes Absolute: 0 %
RBC: 5.05 MIL/uL (ref 3.87–5.11)
RBC: 3.99 MIL/uL (ref 3.87–5.11)
RDW: 19.8 % — ABNORMAL HIGH (ref 11.5–15.5)
RDW: 19.3 % — ABNORMAL HIGH (ref 11.5–15.5)
WBC MORPHOLOGY: INCREASED
WBC: 20.2 10*3/uL — ABNORMAL HIGH (ref 4.0–10.5)
WBC: 11.8 10*3/uL — ABNORMAL HIGH (ref 4.0–10.5)
nRBC: 5 /100 WBC — ABNORMAL HIGH
nRBC: 15 /100 WBC — ABNORMAL HIGH

## 2014-07-01 LAB — PROTIME-INR
INR: 1.44 (ref 0.00–1.49)
INR: 1.83 — ABNORMAL HIGH (ref 0.00–1.49)
Prothrombin Time: 17.7 seconds — ABNORMAL HIGH (ref 11.6–15.2)
Prothrombin Time: 21.4 seconds — ABNORMAL HIGH (ref 11.6–15.2)

## 2014-07-01 LAB — POCT I-STAT 7, (LYTES, BLD GAS, ICA,H+H)
ACID-BASE DEFICIT: 2 mmol/L (ref 0.0–2.0)
ACID-BASE DEFICIT: 5 mmol/L — AB (ref 0.0–2.0)
BICARBONATE: 27.3 meq/L — AB (ref 20.0–24.0)
Bicarbonate: 21.5 mEq/L (ref 20.0–24.0)
Calcium, Ion: 1.1 mmol/L — ABNORMAL LOW (ref 1.12–1.23)
Calcium, Ion: 1.07 mmol/L — ABNORMAL LOW (ref 1.12–1.23)
HCT: 43 % (ref 36.0–46.0)
HCT: 37 % (ref 36.0–46.0)
HEMOGLOBIN: 14.6 g/dL (ref 12.0–15.0)
HEMOGLOBIN: 12.6 g/dL (ref 12.0–15.0)
O2 SAT: 99 %
O2 Saturation: 93 %
PH ART: 7.238 — AB (ref 7.350–7.450)
PH ART: 7.3 — AB (ref 7.350–7.450)
PO2 ART: 82 mmHg (ref 80.0–100.0)
POTASSIUM: 5.8 mmol/L — AB (ref 3.5–5.1)
POTASSIUM: 4.5 mmol/L (ref 3.5–5.1)
Patient temperature: 36
SODIUM: 133 mmol/L — AB (ref 135–145)
Sodium: 136 mmol/L (ref 135–145)
TCO2: 29 mmol/L (ref 0–100)
TCO2: 23 mmol/L (ref 0–100)
pCO2 arterial: 64 mmHg (ref 35.0–45.0)
pCO2 arterial: 43.2 mmHg (ref 35.0–45.0)
pO2, Arterial: 127 mmHg — ABNORMAL HIGH (ref 80.0–100.0)

## 2014-07-01 LAB — RENAL FUNCTION PANEL
ALBUMIN: 2.2 g/dL — AB (ref 3.5–5.2)
Anion gap: 15 (ref 5–15)
BUN: 54 mg/dL — ABNORMAL HIGH (ref 6–23)
CALCIUM: 8 mg/dL — AB (ref 8.4–10.5)
CO2: 22 mmol/L (ref 19–32)
CREATININE: 5.13 mg/dL — AB (ref 0.50–1.10)
Chloride: 102 mmol/L (ref 96–112)
GFR calc Af Amer: 10 mL/min — ABNORMAL LOW (ref >90)
GFR calc non Af Amer: 8 mL/min — ABNORMAL LOW (ref >90)
Glucose, Bld: 144 mg/dL — ABNORMAL HIGH (ref 70–99)
POTASSIUM: 4.7 mmol/L (ref 3.5–5.1)
Phosphorus: 9.7 mg/dL — ABNORMAL HIGH (ref 2.3–4.6)
Sodium: 139 mmol/L (ref 135–145)

## 2014-07-01 LAB — BLOOD GAS, ARTERIAL
ACID-BASE DEFICIT: 1 mmol/L (ref 0.0–2.0)
BICARBONATE: 23.6 meq/L (ref 20.0–24.0)
DRAWN BY: 35849
FIO2: 0.5 %
O2 Saturation: 90.5 %
PATIENT TEMPERATURE: 98.6
PCO2 ART: 42.1 mmHg (ref 35.0–45.0)
PH ART: 7.366 (ref 7.350–7.450)
TCO2: 24.8 mmol/L (ref 0–100)
pO2, Arterial: 70.6 mmHg — ABNORMAL LOW (ref 80.0–100.0)

## 2014-07-01 LAB — CBC
HCT: 32.7 % — ABNORMAL LOW (ref 36.0–46.0)
HEMATOCRIT: 44.7 % (ref 36.0–46.0)
Hemoglobin: 14.9 g/dL (ref 12.0–15.0)
Hemoglobin: 10.6 g/dL — ABNORMAL LOW (ref 12.0–15.0)
MCH: 27.1 pg (ref 26.0–34.0)
MCH: 26.4 pg (ref 26.0–34.0)
MCHC: 33.3 g/dL (ref 30.0–36.0)
MCHC: 32.4 g/dL (ref 30.0–36.0)
MCV: 81.3 fL (ref 78.0–100.0)
MCV: 81.5 fL (ref 78.0–100.0)
Platelets: 189 10*3/uL (ref 150–400)
Platelets: 108 10*3/uL — ABNORMAL LOW (ref 150–400)
RBC: 5.5 MIL/uL — ABNORMAL HIGH (ref 3.87–5.11)
RBC: 4.01 MIL/uL (ref 3.87–5.11)
RDW: 19.8 % — AB (ref 11.5–15.5)
RDW: 19.2 % — AB (ref 11.5–15.5)
WBC: 19.2 10*3/uL — ABNORMAL HIGH (ref 4.0–10.5)
WBC: 12 10*3/uL — ABNORMAL HIGH (ref 4.0–10.5)

## 2014-07-01 LAB — POCT I-STAT, CHEM 8
BUN: 60 mg/dL — ABNORMAL HIGH (ref 6–23)
CHLORIDE: 96 mmol/L (ref 96–112)
CREATININE: 5.7 mg/dL — AB (ref 0.50–1.10)
Calcium, Ion: 1.17 mmol/L (ref 1.12–1.23)
Glucose, Bld: 133 mg/dL — ABNORMAL HIGH (ref 70–99)
HCT: 47 % — ABNORMAL HIGH (ref 36.0–46.0)
Hemoglobin: 16 g/dL — ABNORMAL HIGH (ref 12.0–15.0)
POTASSIUM: 5.7 mmol/L — AB (ref 3.5–5.1)
Sodium: 136 mmol/L (ref 135–145)
TCO2: 22 mmol/L (ref 0–100)

## 2014-07-01 LAB — PROCALCITONIN: Procalcitonin: 126.89 ng/mL

## 2014-07-01 LAB — BASIC METABOLIC PANEL
ANION GAP: 18 — AB (ref 5–15)
BUN: 54 mg/dL — AB (ref 6–23)
CO2: 24 mmol/L (ref 19–32)
CREATININE: 5.42 mg/dL — AB (ref 0.50–1.10)
Calcium: 9.1 mg/dL (ref 8.4–10.5)
Chloride: 96 mmol/L (ref 96–112)
GFR calc Af Amer: 9 mL/min — ABNORMAL LOW (ref >90)
GFR calc non Af Amer: 8 mL/min — ABNORMAL LOW (ref >90)
GLUCOSE: 134 mg/dL — AB (ref 70–99)
Potassium: 5.8 mmol/L — ABNORMAL HIGH (ref 3.5–5.1)
SODIUM: 138 mmol/L (ref 135–145)

## 2014-07-01 LAB — POCT I-STAT 3, ART BLOOD GAS (G3+)
ACID-BASE DEFICIT: 2 mmol/L (ref 0.0–2.0)
ACID-BASE DEFICIT: 2 mmol/L (ref 0.0–2.0)
Bicarbonate: 26.4 mEq/L — ABNORMAL HIGH (ref 20.0–24.0)
Bicarbonate: 24.7 mEq/L — ABNORMAL HIGH (ref 20.0–24.0)
O2 SAT: 100 %
O2 Saturation: 96 %
PCO2 ART: 60.1 mmHg — AB (ref 35.0–45.0)
PH ART: 7.3 — AB (ref 7.350–7.450)
PO2 ART: 93 mmHg (ref 80.0–100.0)
TCO2: 28 mmol/L (ref 0–100)
TCO2: 26 mmol/L (ref 0–100)
pCO2 arterial: 50.2 mmHg — ABNORMAL HIGH (ref 35.0–45.0)
pH, Arterial: 7.251 — ABNORMAL LOW (ref 7.350–7.450)
pO2, Arterial: 198 mmHg — ABNORMAL HIGH (ref 80.0–100.0)

## 2014-07-01 LAB — TYPE AND SCREEN
ABO/RH(D): B POS
Antibody Screen: NEGATIVE

## 2014-07-01 LAB — GLUCOSE, CAPILLARY: Glucose-Capillary: 100 mg/dL — ABNORMAL HIGH (ref 70–99)

## 2014-07-01 LAB — LACTIC ACID, PLASMA
LACTIC ACID, VENOUS: 3.3 mmol/L — AB (ref 0.5–2.0)
Lactic Acid, Venous: 2.1 mmol/L (ref 0.5–2.0)

## 2014-07-01 LAB — PHOSPHORUS: Phosphorus: 9.8 mg/dL — ABNORMAL HIGH (ref 2.3–4.6)

## 2014-07-01 LAB — MAGNESIUM: MAGNESIUM: 2 mg/dL (ref 1.5–2.5)

## 2014-07-01 LAB — MRSA PCR SCREENING: MRSA by PCR: NEGATIVE

## 2014-07-01 LAB — TRIGLYCERIDES: Triglycerides: 49 mg/dL (ref <150)

## 2014-07-01 LAB — ABO/RH: ABO/RH(D): B POS

## 2014-07-01 SURGERY — LAPAROTOMY, EXPLORATORY
Anesthesia: General | Site: Abdomen

## 2014-07-01 MED ORDER — PREDNISONE 20 MG PO TABS
40.0000 mg | ORAL_TABLET | Freq: Once | ORAL | Status: AC
  Administered 2014-07-01: 40 mg via ORAL
  Filled 2014-07-01: qty 2

## 2014-07-01 MED ORDER — FENTANYL CITRATE 0.05 MG/ML IJ SOLN
100.0000 ug | INTRAMUSCULAR | Status: DC | PRN
  Administered 2014-07-01: 100 ug via INTRAVENOUS
  Filled 2014-07-01: qty 2

## 2014-07-01 MED ORDER — HEPARIN SODIUM (PORCINE) 1000 UNIT/ML DIALYSIS
1000.0000 [IU] | INTRAMUSCULAR | Status: DC | PRN
  Administered 2014-07-04 (×2): 2000 [IU] via INTRAVENOUS_CENTRAL
  Filled 2014-07-01: qty 6

## 2014-07-01 MED ORDER — IOHEXOL 300 MG/ML  SOLN
25.0000 mL | INTRAMUSCULAR | Status: AC
Start: 2014-07-01 — End: 2014-07-01

## 2014-07-01 MED ORDER — DEXMEDETOMIDINE HCL IN NACL 400 MCG/100ML IV SOLN
INTRAVENOUS | Status: DC | PRN
  Administered 2014-07-01: 0.6 ug/kg/h via INTRAVENOUS

## 2014-07-01 MED ORDER — SODIUM CHLORIDE 0.9 % FOR CRRT
INTRAVENOUS_CENTRAL | Status: DC | PRN
  Filled 2014-07-01: qty 1000

## 2014-07-01 MED ORDER — DOCUSATE SODIUM 50 MG/5ML PO LIQD
100.0000 mg | Freq: Two times a day (BID) | ORAL | Status: DC | PRN
  Filled 2014-07-01: qty 10

## 2014-07-01 MED ORDER — ENOXAPARIN SODIUM 30 MG/0.3ML ~~LOC~~ SOLN
30.0000 mg | SUBCUTANEOUS | Status: DC
  Administered 2014-07-01 – 2014-07-02 (×2): 30 mg via SUBCUTANEOUS
  Filled 2014-07-01 (×3): qty 0.3

## 2014-07-01 MED ORDER — NOREPINEPHRINE BITARTRATE 1 MG/ML IV SOLN
4000.0000 ug | INTRAVENOUS | Status: DC | PRN
  Administered 2014-07-01: 2.67 ug/min via INTRAVENOUS

## 2014-07-01 MED ORDER — MIDAZOLAM HCL 2 MG/2ML IJ SOLN
INTRAMUSCULAR | Status: AC
  Filled 2014-07-01: qty 2

## 2014-07-01 MED ORDER — FENTANYL CITRATE 0.05 MG/ML IJ SOLN
INTRAMUSCULAR | Status: AC
  Filled 2014-07-01: qty 5

## 2014-07-01 MED ORDER — SODIUM POLYSTYRENE SULFONATE 15 GM/60ML PO SUSP: 15.0000 g | Freq: Once | ORAL | Status: DC

## 2014-07-01 MED ORDER — PREDNISONE 20 MG PO TABS: 20.0000 mg | ORAL_TABLET | Freq: Every day | ORAL | Status: DC

## 2014-07-01 MED ORDER — LEVALBUTEROL HCL 0.63 MG/3ML IN NEBU
0.6300 mg | INHALATION_SOLUTION | Freq: Four times a day (QID) | RESPIRATORY_TRACT | Status: DC
  Administered 2014-07-01 – 2014-07-16 (×56): 0.63 mg via RESPIRATORY_TRACT
  Filled 2014-07-01 (×119): qty 3

## 2014-07-01 MED ORDER — FENTANYL CITRATE 0.05 MG/ML IJ SOLN
INTRAMUSCULAR | Status: DC | PRN
  Administered 2014-07-01: 100 ug via INTRAVENOUS

## 2014-07-01 MED ORDER — PRISMASOL BGK 0/2.5 32-2.5 MEQ/L IV SOLN
INTRAVENOUS | Status: DC
  Filled 2014-07-01 (×6): qty 5000

## 2014-07-01 MED ORDER — NOREPINEPHRINE BITARTRATE 1 MG/ML IV SOLN
0.0000 ug/min | INTRAVENOUS | Status: DC
  Administered 2014-07-01 – 2014-07-03 (×2): 4 ug/min via INTRAVENOUS
  Administered 2014-07-04 – 2014-07-06 (×2): 2 ug/min via INTRAVENOUS
  Administered 2014-07-08: 8 ug/min via INTRAVENOUS
  Filled 2014-07-01 (×5): qty 16

## 2014-07-01 MED ORDER — NOREPINEPHRINE BITARTRATE 1 MG/ML IV SOLN
0.0000 ug/min | INTRAVENOUS | Status: DC
  Administered 2014-07-01: 10 ug/min via INTRAVENOUS
  Filled 2014-07-01: qty 4

## 2014-07-01 MED ORDER — SODIUM BICARBONATE 8.4 % IV SOLN
INTRAVENOUS | Status: DC | PRN
Start: 2014-07-01 — End: 2014-07-01
  Administered 2014-07-01: 50 meq via INTRAVENOUS

## 2014-07-01 MED ORDER — PANTOPRAZOLE SODIUM 40 MG IV SOLR
40.0000 mg | INTRAVENOUS | Status: DC
  Administered 2014-07-01: 40 mg via INTRAVENOUS
  Filled 2014-07-01 (×2): qty 40

## 2014-07-01 MED ORDER — ETOMIDATE 2 MG/ML IV SOLN
INTRAVENOUS | Status: DC | PRN
  Administered 2014-07-01: 10 mg via INTRAVENOUS

## 2014-07-01 MED ORDER — 0.9 % SODIUM CHLORIDE (POUR BTL) OPTIME
TOPICAL | Status: DC | PRN
  Administered 2014-07-01 (×2): 2000 mL
  Administered 2014-07-01: 4000 mL

## 2014-07-01 MED ORDER — VANCOMYCIN HCL IN DEXTROSE 1-5 GM/200ML-% IV SOLN
1000.0000 mg | INTRAVENOUS | Status: DC
  Administered 2014-07-02 – 2014-07-04 (×3): 1000 mg via INTRAVENOUS
  Filled 2014-07-01 (×4): qty 200

## 2014-07-01 MED ORDER — INSULIN REGULAR HUMAN 100 UNIT/ML IJ SOLN
10.0000 [IU] | Freq: Once | INTRAMUSCULAR | Status: DC
  Filled 2014-07-01: qty 0.1

## 2014-07-01 MED ORDER — FENTANYL CITRATE 0.05 MG/ML IJ SOLN
100.0000 ug | INTRAMUSCULAR | Status: DC | PRN
  Administered 2014-07-02 – 2014-07-04 (×10): 100 ug via INTRAVENOUS
  Filled 2014-07-01 (×10): qty 2

## 2014-07-01 MED ORDER — PROPOFOL 10 MG/ML IV EMUL
0.0000 ug/kg/min | INTRAVENOUS | Status: DC
  Administered 2014-07-01: 5 ug/kg/min via INTRAVENOUS
  Filled 2014-07-01: qty 100

## 2014-07-01 MED ORDER — SODIUM CHLORIDE 0.9 % IV SOLN
INTRAVENOUS | Status: DC | PRN
  Administered 2014-07-01: 16:00:00 via INTRAVENOUS

## 2014-07-01 MED ORDER — MORPHINE SULFATE 2 MG/ML IJ SOLN
1.0000 mg | INTRAMUSCULAR | Status: DC | PRN
  Administered 2014-07-06 – 2014-07-08 (×9): 1 mg via INTRAVENOUS
  Filled 2014-07-01 (×9): qty 1

## 2014-07-01 MED ORDER — PRISMASOL BGK 4/2.5 32-4-2.5 MEQ/L IV SOLN
INTRAVENOUS | Status: DC
  Administered 2014-07-01 – 2014-07-04 (×5): via INTRAVENOUS_CENTRAL
  Filled 2014-07-01 (×10): qty 5000

## 2014-07-01 MED ORDER — VECURONIUM BROMIDE 10 MG IV SOLR
INTRAVENOUS | Status: DC | PRN
  Administered 2014-07-01: 4 mg via INTRAVENOUS

## 2014-07-01 MED ORDER — CETYLPYRIDINIUM CHLORIDE 0.05 % MT LIQD: 7.0000 mL | Freq: Four times a day (QID) | OROMUCOSAL | Status: DC

## 2014-07-01 MED ORDER — HYDROMORPHONE HCL 1 MG/ML IJ SOLN
1.0000 mg | INTRAMUSCULAR | Status: DC | PRN
  Administered 2014-07-03 – 2014-07-06 (×6): 1 mg via INTRAVENOUS
  Filled 2014-07-01 (×6): qty 1

## 2014-07-01 MED ORDER — IPRATROPIUM BROMIDE 0.02 % IN SOLN
0.5000 mg | Freq: Four times a day (QID) | RESPIRATORY_TRACT | Status: DC
  Administered 2014-07-01 – 2014-07-16 (×56): 0.5 mg via RESPIRATORY_TRACT
  Filled 2014-07-01 (×59): qty 2.5

## 2014-07-01 MED ORDER — FLUCONAZOLE IN SODIUM CHLORIDE 400-0.9 MG/200ML-% IV SOLN
400.0000 mg | INTRAVENOUS | Status: DC
  Administered 2014-07-01 – 2014-07-04 (×4): 400 mg via INTRAVENOUS
  Filled 2014-07-01 (×4): qty 200

## 2014-07-01 MED ORDER — PHENYLEPHRINE HCL 10 MG/ML IJ SOLN
10.0000 mg | INTRAVENOUS | Status: DC | PRN
  Administered 2014-07-01: 30 ug/min via INTRAVENOUS

## 2014-07-01 MED ORDER — SODIUM CHLORIDE 0.9 % IV SOLN
500.0000 mg | Freq: Once | INTRAVENOUS | Status: AC
  Administered 2014-07-01: 500 mg via INTRAVENOUS
  Filled 2014-07-01: qty 500

## 2014-07-01 MED ORDER — FLUCONAZOLE IN SODIUM CHLORIDE 200-0.9 MG/100ML-% IV SOLN
200.0000 mg | INTRAVENOUS | Status: DC
  Filled 2014-07-01: qty 100

## 2014-07-01 MED ORDER — HYDROCORTISONE NA SUCCINATE PF 100 MG IJ SOLR
50.0000 mg | Freq: Four times a day (QID) | INTRAMUSCULAR | Status: DC
  Administered 2014-07-01 – 2014-07-04 (×10): 50 mg via INTRAVENOUS
  Filled 2014-07-01 (×16): qty 1

## 2014-07-01 MED ORDER — PIPERACILLIN-TAZOBACTAM 3.375 G IVPB 30 MIN
3.3750 g | Freq: Four times a day (QID) | INTRAVENOUS | Status: DC
  Administered 2014-07-01 – 2014-07-05 (×14): 3.375 g via INTRAVENOUS
  Filled 2014-07-01 (×15): qty 50

## 2014-07-01 MED ORDER — PRISMASOL BGK 4/2.5 32-4-2.5 MEQ/L IV SOLN
INTRAVENOUS | Status: DC
  Administered 2014-07-01 – 2014-07-04 (×5): via INTRAVENOUS_CENTRAL
  Filled 2014-07-01 (×9): qty 5000

## 2014-07-01 MED ORDER — SUCCINYLCHOLINE CHLORIDE 20 MG/ML IJ SOLN
INTRAMUSCULAR | Status: DC | PRN
  Administered 2014-07-01: 100 mg via INTRAVENOUS

## 2014-07-01 MED ORDER — DEXTROSE 50 % IV SOLN
1.0000 | Freq: Once | INTRAVENOUS | Status: DC
  Filled 2014-07-01: qty 50

## 2014-07-01 MED ORDER — CHLORHEXIDINE GLUCONATE 0.12 % MT SOLN
15.0000 mL | Freq: Two times a day (BID) | OROMUCOSAL | Status: DC
  Administered 2014-07-01 – 2014-07-13 (×24): 15 mL via OROMUCOSAL
  Filled 2014-07-01 (×25): qty 15

## 2014-07-01 MED ORDER — PIPERACILLIN-TAZOBACTAM IN DEX 2-0.25 GM/50ML IV SOLN
2.2500 g | Freq: Three times a day (TID) | INTRAVENOUS | Status: DC
  Administered 2014-07-01: 2.25 g via INTRAVENOUS
  Filled 2014-07-01 (×3): qty 50

## 2014-07-01 MED ORDER — POTASSIUM CHLORIDE 2 MEQ/ML IV SOLN
INTRAVENOUS | Status: DC
  Administered 2014-07-01 – 2014-07-03 (×12): via INTRAVENOUS_CENTRAL
  Filled 2014-07-01 (×22): qty 5000

## 2014-07-01 MED ORDER — ALBUMIN HUMAN 5 % IV SOLN
INTRAVENOUS | Status: DC | PRN
  Administered 2014-07-01 (×2): via INTRAVENOUS

## 2014-07-01 MED ORDER — SODIUM CHLORIDE 0.9 % IV SOLN
INTRAVENOUS | Status: DC
  Administered 2014-07-01 – 2014-07-02 (×3): via INTRAVENOUS

## 2014-07-01 SURGICAL SUPPLY — 66 items
BLADE SURG ROTATE 9660 (MISCELLANEOUS) IMPLANT
BNDG GAUZE ELAST 4 BULKY (GAUZE/BANDAGES/DRESSINGS) ×2 IMPLANT
CANISTER SUCTION 2500CC (MISCELLANEOUS) ×1 IMPLANT
CHLORAPREP W/TINT 26ML (MISCELLANEOUS) ×3 IMPLANT
COVER MAYO STAND STRL (DRAPES) IMPLANT
COVER SURGICAL LIGHT HANDLE (MISCELLANEOUS) ×3 IMPLANT
DRAIN CHANNEL 19F RND (DRAIN) ×2 IMPLANT
DRAPE LAPAROSCOPIC ABDOMINAL (DRAPES) ×3 IMPLANT
DRAPE PROXIMA HALF (DRAPES) IMPLANT
DRAPE UTILITY XL STRL (DRAPES) ×2 IMPLANT
DRAPE WARM FLUID 44X44 (DRAPE) ×3 IMPLANT
DRSG OPSITE POSTOP 4X10 (GAUZE/BANDAGES/DRESSINGS) IMPLANT
DRSG OPSITE POSTOP 4X8 (GAUZE/BANDAGES/DRESSINGS) IMPLANT
ELECT BLADE 6.5 EXT (BLADE) IMPLANT
ELECT CAUTERY BLADE 6.4 (BLADE) ×4 IMPLANT
ELECT REM PT RETURN 9FT ADLT (ELECTROSURGICAL) ×3
ELECTRODE REM PT RTRN 9FT ADLT (ELECTROSURGICAL) ×1 IMPLANT
EVACUATOR SILICONE 100CC (DRAIN) ×2 IMPLANT
GLOVE BIO SURGEON STRL SZ8 (GLOVE) ×2 IMPLANT
GLOVE BIOGEL PI IND STRL 7.0 (GLOVE) IMPLANT
GLOVE BIOGEL PI IND STRL 7.5 (GLOVE) IMPLANT
GLOVE BIOGEL PI IND STRL 8 (GLOVE) IMPLANT
GLOVE BIOGEL PI INDICATOR 7.0 (GLOVE) ×4
GLOVE BIOGEL PI INDICATOR 7.5 (GLOVE) ×2
GLOVE BIOGEL PI INDICATOR 8 (GLOVE) ×2
GLOVE EUDERMIC 7 POWDERFREE (GLOVE) ×3 IMPLANT
GLOVE SURG SS PI 7.0 STRL IVOR (GLOVE) ×2 IMPLANT
GOWN STRL REUS W/ TWL LRG LVL3 (GOWN DISPOSABLE) ×2 IMPLANT
GOWN STRL REUS W/ TWL XL LVL3 (GOWN DISPOSABLE) ×1 IMPLANT
GOWN STRL REUS W/TWL LRG LVL3 (GOWN DISPOSABLE) ×3
GOWN STRL REUS W/TWL XL LVL3 (GOWN DISPOSABLE) ×6
KIT BASIN OR (CUSTOM PROCEDURE TRAY) ×3 IMPLANT
KIT ROOM TURNOVER OR (KITS) ×3 IMPLANT
LIGASURE IMPACT 36 18CM CVD LR (INSTRUMENTS) IMPLANT
MANIFOLD NEPTUNE II (INSTRUMENTS) ×2 IMPLANT
NS IRRIG 1000ML POUR BTL (IV SOLUTION) ×18 IMPLANT
PACK GENERAL/GYN (CUSTOM PROCEDURE TRAY) ×3 IMPLANT
PAD ABD 8X10 STRL (GAUZE/BANDAGES/DRESSINGS) ×6 IMPLANT
PAD ARMBOARD 7.5X6 YLW CONV (MISCELLANEOUS) ×3 IMPLANT
PENCIL BUTTON HOLSTER BLD 10FT (ELECTRODE) IMPLANT
RELOAD PROXIMATE 75MM BLUE (ENDOMECHANICALS) ×9 IMPLANT
RELOAD STAPLE 75 3.8 BLU REG (ENDOMECHANICALS) IMPLANT
SPECIMEN JAR LARGE (MISCELLANEOUS) IMPLANT
SPONGE GAUZE 4X4 12PLY STER LF (GAUZE/BANDAGES/DRESSINGS) ×4 IMPLANT
SPONGE LAP 18X18 X RAY DECT (DISPOSABLE) IMPLANT
STAPLER GUN LINEAR PROX 60 (STAPLE) ×2 IMPLANT
STAPLER PROXIMATE 75MM BLUE (STAPLE) ×2 IMPLANT
STAPLER VISISTAT 35W (STAPLE) ×3 IMPLANT
SUCTION POOLE TIP (SUCTIONS) ×3 IMPLANT
SUT ETHILON 2 0 FS 18 (SUTURE) ×2 IMPLANT
SUT NOVA 1 T20/GS 25DT (SUTURE) ×2 IMPLANT
SUT PDS AB 1 TP1 96 (SUTURE) ×6 IMPLANT
SUT SILK 2 0 SH CR/8 (SUTURE) ×3 IMPLANT
SUT SILK 2 0 TIES 10X30 (SUTURE) ×3 IMPLANT
SUT SILK 2 0SH CR/8 30 (SUTURE) ×2 IMPLANT
SUT SILK 3 0 SH CR/8 (SUTURE) ×3 IMPLANT
SUT SILK 3 0 TIES 10X30 (SUTURE) ×3 IMPLANT
SWAB COLLECTION DEVICE MRSA (MISCELLANEOUS) ×2 IMPLANT
TAPE CLOTH SURG 6X10 WHT LF (GAUZE/BANDAGES/DRESSINGS) ×4 IMPLANT
TOWEL OR 17X24 6PK STRL BLUE (TOWEL DISPOSABLE) ×1 IMPLANT
TOWEL OR 17X26 10 PK STRL BLUE (TOWEL DISPOSABLE) ×3 IMPLANT
TRAY FOLEY CATH 16FRSI W/METER (SET/KITS/TRAYS/PACK) ×2 IMPLANT
TUBE ANAEROBIC SPECIMEN COL (MISCELLANEOUS) ×2 IMPLANT
TUBE CONNECTING 12'X1/4 (SUCTIONS)
TUBE CONNECTING 12X1/4 (SUCTIONS) IMPLANT
YANKAUER SUCT BULB TIP NO VENT (SUCTIONS) IMPLANT

## 2014-07-01 NOTE — Consult Note (Signed)
[name] 03/19/1956  [id number].   Requesting MD: Dr. Chesley Mires Chief Complaint/Reason for Consult: pneumoperitoneum  HPI: This is a 59 yo black female with multiple medical problems including COPD, admission for hypoxia secondary to H1N1, who had a CVA during this admission, significant cor pulmonale, ESRD on HD, acute encephalopathy, PSVT, coagulopathy, who is somnolent now and can not provide a history.  From what history I can obtain from the nurses and the chart, the patient was admitted with hypoxia secondary to the flu.  She spent 2 weeks in the ICU.  During that time she had a CVA of multifocal infarcts bilaterally.  She is on subcutaneous heparin and $RemoveBef'325mg'kSzmLyBhXo$  ASA right now, but no other blood thinners.  She apparently has been having some abdominal pain, but unsure if she told anyone.  CCM was asked to see her today and noticed her abdomen was distended.  A CT scan was obtained and she was noted to have a perforation of her viscus with what appeared to be ischemia of her ileum.  We have been asked to see the patient for further evaluation.  ROS : unable to obtain due to altered mental status  Family History  Problem Relation Age of Onset  . Diabetes Mother   . Hypertension Mother   . Heart disease Mother   . Heart attack Mother   . Peripheral vascular disease Mother   . Diabetes Father   . Heart disease Father   . Hypertension Father   . Diabetes Sister   . Hypertension Sister   . Heart disease Sister     before age 48  . Heart attack Sister   . Peripheral vascular disease Sister   . Heart disease Brother   . Hyperlipidemia Daughter     Past Medical History  Diagnosis Date  . Asthma   . Hypertension   . Gout   . Arthritis   . Insomnia   . Chronic kidney disease   . Depression     Past Surgical History  Procedure Laterality Date  . Multiple tooth extractions    . Av fistula placement Left 01/28/2014    Procedure: ARTERIOVENOUS (AV) FISTULA CREATION;  Surgeon:  Elam Dutch, MD;  Location: [facility];  Service: Vascular;  Laterality: Left;  . Tee without cardioversion N/A 06/23/2014    Procedure: TRANSESOPHAGEAL ECHOCARDIOGRAM (TEE);  Surgeon: Lelon Perla, MD;  Location: [facility];  Service: Cardiovascular;  Laterality: N/A;  . Insertion of dialysis catheter Right 06/27/2014    Procedure: INSERTION OF Right Internal Jugular DIATEK CATHETER;  Surgeon: Mal Misty, MD;  Location: [facility];  Service: Vascular;  Laterality: Right;    Social History:  reports that she has been smoking Cigarettes.  She has a 10 pack-year smoking history. She has never used smokeless tobacco. She reports that she drinks alcohol. She reports that she does not use illicit drugs.  Allergies: No Known Allergies  Medications Prior to Admission  Medication Sig Dispense Refill  . allopurinol (ZYLOPRIM) 100 MG tablet Take 100 mg by mouth 2 (two) times daily as needed (for gout).    . citalopram (CELEXA) 20 MG tablet Take 20 mg by mouth daily.    . furosemide (LASIX) 80 MG tablet Take 80 mg by mouth daily.    . hydrOXYzine (ATARAX/VISTARIL) 25 MG tablet Take 25 mg by mouth 3 (three) times daily as needed for anxiety.    Marland Kitchen oxyCODONE (ROXICODONE) 5 MG immediate release tablet Take 1 tablet (5  mg total) by mouth every 6 (six) hours as needed for severe pain. (Patient not taking: Reported on 06/17/2014) 15 tablet 0    Blood pressure 139/92, pulse 26, temperature 97.7 F (36.5 C), temperature source Axillary, resp. rate 29, height $RemoveBe'5\' 5"'fZItCvIqC$  (1.651 m), weight 102.513 kg (226 lb), SpO2 98 %. Physical Exam: General: obtunded, obese black female who is laying in bed and appears chronically ill HEENT: head is normocephalic, atraumatic.  Sclera are noninjected.  PERRL.  Ears and nose without any masses or lesions.  Mouth is pink and moist Heart: regular, rate, and rhythm.  Normal s1,s2. No obvious murmurs, gallops, or rubs noted.  Palpable radial and pedal pulses bilaterally Lungs: CTAB,  but on venti mask.  Some decrease breath sound Abd: distended with peritoneal signs.  + guarding, hypoactive BS.  Ecchymosis noted on RLQ due to heparin injection MS: all 4 extremities are symmetrical but flaccid on left side due to recent CVA Skin: warm and dry with no masses, lesions, or rashes Psych: obtunded, unable to obtain    Results for orders placed or performed during the hospital encounter of 06/17/14 (from the past 48 hour(s))  Blood gas, arterial     Status: Abnormal   Collection Time: 06/29/14 10:34 PM  Result Value Ref Range   FIO2 0.50 %   O2 Content 12.0 L/min   Delivery systems VENTURI MASK    pH, Arterial 7.387 7.350 - 7.450   pCO2 arterial 47.0 (H) 35.0 - 45.0 mmHg   pO2, Arterial 63.7 (L) 80.0 - 100.0 mmHg   Bicarbonate 27.7 (H) 20.0 - 24.0 mEq/L   TCO2 29.2 0 - 100 mmol/L   Acid-Base Excess 3.0 (H) 0.0 - 2.0 mmol/L   O2 Saturation 89.1 %   Patient temperature 98.2    Collection site RIGHT RADIAL    Drawn by [id number]    Sample type ARTERIAL    Allens test (pass/fail) PASS PASS  CBC     Status: Abnormal   Collection Time: 06/30/14  3:58 AM  Result Value Ref Range   WBC 32.4 (H) 4.0 - 10.5 K/uL   RBC 4.61 3.87 - 5.11 MIL/uL   Hemoglobin 12.4 12.0 - 15.0 g/dL   HCT 37.9 36.0 - 46.0 %   MCV 82.2 78.0 - 100.0 fL   MCH 26.9 26.0 - 34.0 pg   MCHC 32.7 30.0 - 36.0 g/dL   RDW 19.8 (H) 11.5 - 15.5 %   Platelets 210 150 - 400 K/uL    Comment: REPEATED TO VERIFY SPECIMEN CHECKED FOR CLOTS   Basic metabolic panel     Status: Abnormal   Collection Time: 06/30/14  3:58 AM  Result Value Ref Range   Sodium 138 135 - 145 mmol/L    Comment: DELTA CHECK NOTED   Potassium >7.5 (HH) 3.5 - 5.1 mmol/L    Comment: CRITICAL RESULT CALLED TO, READ BACK BY AND VERIFIED WITH: RALPH,C RN 06/30/2014 0620 JOREDANS REPEATED TO VERIFY NO VISIBLE HEMOLYSIS    Chloride 98 96 - 112 mmol/L   CO2 28 19 - 32 mmol/L   Glucose, Bld 56 (L) 70 - 99 mg/dL   BUN 52 (H) 6 - 23 mg/dL     Comment: DELTA CHECK NOTED   Creatinine, Ser 4.81 (H) 0.50 - 1.10 mg/dL   Calcium 8.6 8.4 - 10.5 mg/dL   GFR calc non Af Amer 9 (L) >90 mL/min   GFR calc Af Amer 11 (L) >90 mL/min    Comment: (NOTE) The  eGFR has been calculated using the CKD EPI equation. This calculation has not been validated in all clinical situations. eGFR's persistently <90 mL/min signify possible Chronic Kidney Disease.    Anion gap 12 5 - 15  I-STAT, chem 8     Status: Abnormal   Collection Time: 06/30/14 11:01 AM  Result Value Ref Range   Sodium 140 135 - 145 mmol/L   Potassium 3.6 3.5 - 5.1 mmol/L   Chloride 97 96 - 112 mmol/L   BUN 32 (H) 6 - 23 mg/dL   Creatinine, Ser 3.40 (H) 0.50 - 1.10 mg/dL   Glucose, Bld 104 (H) 70 - 99 mg/dL   Calcium, Ion 1.20 1.12 - 1.23 mmol/L   TCO2 23 0 - 100 mmol/L   Hemoglobin 16.0 (H) 12.0 - 15.0 g/dL   HCT 47.0 (H) 36.0 - 46.0 %  Potassium     Status: Abnormal   Collection Time: 06/30/14 11:15 AM  Result Value Ref Range   Potassium 3.4 (L) 3.5 - 5.1 mmol/L  CBC     Status: Abnormal   Collection Time: 07/01/14  3:18 AM  Result Value Ref Range   WBC 19.2 (H) 4.0 - 10.5 K/uL   RBC 5.50 (H) 3.87 - 5.11 MIL/uL   Hemoglobin 14.9 12.0 - 15.0 g/dL   HCT 44.7 36.0 - 46.0 %   MCV 81.3 78.0 - 100.0 fL   MCH 27.1 26.0 - 34.0 pg   MCHC 33.3 30.0 - 36.0 g/dL   RDW 19.8 (H) 11.5 - 15.5 %   Platelets 189 150 - 400 K/uL  Comprehensive metabolic panel     Status: Abnormal   Collection Time: 07/01/14  3:18 AM  Result Value Ref Range   Sodium 140 135 - 145 mmol/L   Potassium 5.8 (H) 3.5 - 5.1 mmol/L    Comment: DELTA CHECK NOTED SLIGHT HEMOLYSIS    Chloride 98 96 - 112 mmol/L   CO2 27 19 - 32 mmol/L   Glucose, Bld 92 70 - 99 mg/dL   BUN 36 (H) 6 - 23 mg/dL   Creatinine, Ser 4.41 (H) 0.50 - 1.10 mg/dL   Calcium 8.9 8.4 - 10.5 mg/dL   Total Protein 6.7 6.0 - 8.3 g/dL   Albumin 2.5 (L) 3.5 - 5.2 g/dL   AST 91 (H) 0 - 37 U/L   ALT 64 (H) 0 - 35 U/L   Alkaline Phosphatase  105 39 - 117 U/L   Total Bilirubin 2.4 (H) 0.3 - 1.2 mg/dL   GFR calc non Af Amer 10 (L) >90 mL/min   GFR calc Af Amer 12 (L) >90 mL/min    Comment: (NOTE) The eGFR has been calculated using the CKD EPI equation. This calculation has not been validated in all clinical situations. eGFR's persistently <90 mL/min signify possible Chronic Kidney Disease.    Anion gap 15 5 - 15  Blood gas, arterial     Status: Abnormal   Collection Time: 07/01/14  7:20 AM  Result Value Ref Range   FIO2 0.50 %   Delivery systems VENTURI MASK    pH, Arterial 7.366 7.350 - 7.450   pCO2 arterial 42.1 35.0 - 45.0 mmHg   pO2, Arterial 70.6 (L) 80.0 - 100.0 mmHg   Bicarbonate 23.6 20.0 - 24.0 mEq/L   TCO2 24.8 0 - 100 mmol/L   Acid-base deficit 1.0 0.0 - 2.0 mmol/L   O2 Saturation 90.5 %   Patient temperature 98.6    Collection site RIGHT RADIAL  Drawn by [phone]    Sample type ARTERIAL DRAW    Allens test (pass/fail) PASS PASS  Glucose, capillary     Status: Abnormal   Collection Time: 07/01/14  1:26 PM  Result Value Ref Range   Glucose-Capillary 100 (H) 70 - 99 mg/dL   Ct Abdomen Pelvis Wo Contrast  07/01/2014   CLINICAL DATA:  Renal failure and hypertension.  Respiratory failure  EXAM: CT CHEST, ABDOMEN AND PELVIS WITHOUT CONTRAST  TECHNIQUE: Multidetector CT imaging of the chest, abdomen and pelvis was performed following the standard protocol without IV contrast.  COMPARISON:  None.  FINDINGS: CT CHEST FINDINGS  THORACIC INLET/BODY WALL:  Right IJ central line is in good position.  MEDIASTINUM:  Cardiomegaly which is mild. No pericardial effusion. Diffuse atherosclerosis, including the coronary arteries. No evidence of acute vascular abnormality.  LUNG WINDOWS:  Complete opacification of the left lower lobe with volume loss. Trace left pleural effusion. Patchy nodular densities in the posterior mid lungs is are likely atelectatic or inflammatory.  OSSEOUS:  No acute fracture.  No suspicious lytic or  blastic lesions.  CT ABDOMEN AND PELVIS FINDINGS  BODY WALL: No contributory findings.  Liver: Flattened appearance of the liver margins of uncertain significance. Given the fluid level, a subcapsular collection is not suspected.  Biliary: No evidence of biliary obstruction or stone.  Pancreas: Unremarkable.  Spleen: Unremarkable.  Adrenals: Unremarkable.  Kidneys and ureters: Symmetric perinephric edema. There is bilateral smooth renal atrophy. An 18 mm presumed cyst is present in the interpolar left kidney. No hydronephrosis.  Bladder: Unremarkable.  Reproductive: Calcified fibroid in the posterior uterus measuring approximately 2 cm.  Bowel: There is probable pneumatosis of pelvic small bowel loops, the presumed source of large pneumoperitoneum. There is also moderate ascites throughout the abdomen, with bubbles of non floating gas in the rectovaginal recess, likely feculent material. No obstruction above the pneumatosis. No portal venous gas.  Vascular: 29 mm infrarenal fusiform aortic aneurysm. No visible vascular findings to explain the above.  OSSEOUS: No acute abnormalities.  Critical Value/emergent results were called by telephone at the time of interpretation on 07/01/2014 at 1:08 pm to Dr. Darlina Sicilian , who verbally acknowledged these results.  IMPRESSION: 1. Perforated bowel with large pneumoperitoneum. Ileal pneumatosis suggests small bowel ischemia/necrosis. In this patient with recent embolic stroke, suspect mesenteric embolus. 2. Moderate ascites with feculent material or developing abscess in the rectovaginal recess. 3. Left lower lobe collapse. 4. 29 mm infrarenal aortic aneurysm.   Electronically Signed   By: Monte Fantasia M.D.   On: 07/01/2014 13:17   Ct Chest Wo Contrast  07/01/2014   CLINICAL DATA:  Renal failure and hypertension.  Respiratory failure  EXAM: CT CHEST, ABDOMEN AND PELVIS WITHOUT CONTRAST  TECHNIQUE: Multidetector CT imaging of the chest, abdomen and pelvis was  performed following the standard protocol without IV contrast.  COMPARISON:  None.  FINDINGS: CT CHEST FINDINGS  THORACIC INLET/BODY WALL:  Right IJ central line is in good position.  MEDIASTINUM:  Cardiomegaly which is mild. No pericardial effusion. Diffuse atherosclerosis, including the coronary arteries. No evidence of acute vascular abnormality.  LUNG WINDOWS:  Complete opacification of the left lower lobe with volume loss. Trace left pleural effusion. Patchy nodular densities in the posterior mid lungs is are likely atelectatic or inflammatory.  OSSEOUS:  No acute fracture.  No suspicious lytic or blastic lesions.  CT ABDOMEN AND PELVIS FINDINGS  BODY WALL: No contributory findings.  Liver: Flattened appearance of the liver  margins of uncertain significance. Given the fluid level, a subcapsular collection is not suspected.  Biliary: No evidence of biliary obstruction or stone.  Pancreas: Unremarkable.  Spleen: Unremarkable.  Adrenals: Unremarkable.  Kidneys and ureters: Symmetric perinephric edema. There is bilateral smooth renal atrophy. An 18 mm presumed cyst is present in the interpolar left kidney. No hydronephrosis.  Bladder: Unremarkable.  Reproductive: Calcified fibroid in the posterior uterus measuring approximately 2 cm.  Bowel: There is probable pneumatosis of pelvic small bowel loops, the presumed source of large pneumoperitoneum. There is also moderate ascites throughout the abdomen, with bubbles of non floating gas in the rectovaginal recess, likely feculent material. No obstruction above the pneumatosis. No portal venous gas.  Vascular: 29 mm infrarenal fusiform aortic aneurysm. No visible vascular findings to explain the above.  OSSEOUS: No acute abnormalities.  Critical Value/emergent results were called by telephone at the time of interpretation on 07/01/2014 at 1:08 pm to Dr. Darlina Sicilian , who verbally acknowledged these results.  IMPRESSION: 1. Perforated bowel with large  pneumoperitoneum. Ileal pneumatosis suggests small bowel ischemia/necrosis. In this patient with recent embolic stroke, suspect mesenteric embolus. 2. Moderate ascites with feculent material or developing abscess in the rectovaginal recess. 3. Left lower lobe collapse. 4. 29 mm infrarenal aortic aneurysm.   Electronically Signed   By: Monte Fantasia M.D.   On: 07/01/2014 13:17   Dg Chest Port 1 View  07/01/2014   CLINICAL DATA:  Dyspnea.  EXAM: PORTABLE CHEST - 1 VIEW  COMPARISON:  06/29/2014  FINDINGS: Right jugular dual-lumen catheter in the SVC unchanged. No pneumothorax.  Left lower lobe consolidation with volume loss unchanged. This is most consistent with collapse in the left lower lobe. Possible small left effusion. Right lower lobe atelectasis unchanged. Negative for edema.  IMPRESSION: Left lower lobe collapse remains unchanged. Mild right lower lobe atelectasis. Negative for edema.   Electronically Signed   By: Franchot Gallo M.D.   On: 07/01/2014 07:48   Dg Chest Port 1 View  06/29/2014   CLINICAL DATA:  Acute onset of shortness of breath. Initial encounter.  EXAM: PORTABLE CHEST - 1 VIEW  COMPARISON:  Chest radiograph performed earlier today at 6:47 a.m.  FINDINGS: The lungs are relatively well expanded. Persistent retrocardiac airspace opacification may reflect asymmetric pulmonary edema or pneumonia. Underlying vascular congestion is again noted. A small left pleural effusion is suspected. No pneumothorax is seen.  The cardiomediastinal silhouette is borderline enlarged. A right-sided dual-lumen catheter is seen ending overlying the mid SVC. No acute osseous abnormalities are seen.  IMPRESSION: Persistent retrocardiac airspace opacification may reflect asymmetric pulmonary edema or pneumonia. Underlying vascular congestion and borderline cardiomegaly again noted. Small left pleural effusion suspected.   Electronically Signed   By: Garald Balding M.D.   On: 06/29/2014 23:03        Assessment/Plan 1. Pneumoperitoneum, likely secondary to perforated small bowel from ischemia -daughter on her way so we can discuss surgical options.  The patient is unable to sign her own consent given her mental status.   -the patient is at significantly high risk clinically as well as by the ACS calculator.  They predict death at 59%, any complication 16%, and SNF placement at 65%.  We feel as these are underestimated and are actually quite a bit higher.  (unable to put in all of the needed data) -she will die without an operation, but we fear she may still die with an operation or her quality of life will be significantly  altered given her functional status currently along with her multitude of significant co-morbidities.. -agree with zosyn and vanc -npo Grade 3 Cor Pulmonale ESRD on HD CVA -embolic this admit  -flaccid left side H/o HTN COPD Coagulopathy Encephalopathy  Leukocytosis Hyperkalemia LLL collapse PSVT  Alaster Asfaw E 07/01/2014, 2:06 PM Pager: [phone]

## 2014-07-01 NOTE — Progress Notes (Signed)
Per Dr Dema SeverinMungal vent changes made

## 2014-07-01 NOTE — Procedures (Signed)
Central Venous Catheter Insertion Procedure Note Zareth C Dehoyos [id number] Apr 09, 1955  Procedure: Insertion of Central Venous Catheter Indications: Drug and/or fluid administration  Procedure Details Consent: Risks of procedure as well as the alternatives and risks of each were explained to the (patient/caregiver).  Consent for procedure obtained. Time Out: Verified patient identification, verified procedure, site/side was marked, verified correct patient position, special equipment/implants available, medications/allergies/relevent history reviewed, required imaging and test results available.  Performed  Maximum sterile technique was used including antiseptics, cap, gloves, gown, hand hygiene, mask and sheet. Skin prep: Chlorhexidine; local anesthetic administered A antimicrobial bonded/coated triple lumen catheter was placed in the left internal jugular vein using the Seldinger technique.  Evaluation Blood flow good Complications: No apparent complications Patient did tolerate procedure well. Chest X-ray ordered to verify placement.  CXR: pending.   Performed under direct MD supervision.  Performed using ultrasound guidance.  Wire visualized in vessel under ultrasound.    Dirk DressKaty Whiteheart, NP 07/01/2014  3:08 PM    Levy Pupaobert Asher Torpey, MD, PhD 07/01/2014, 4:43 PM [facility] [phone] or if no answer [phone]

## 2014-07-01 NOTE — Progress Notes (Signed)
UR COMPLETED  

## 2014-07-01 NOTE — Progress Notes (Signed)
Subjective:  Writhing in abdominal pain this am, O2 sats low per RN  Objective:  Vital Signs in the last 24 hours: Temp:  [97.6 F (36.4 C)-98.4 F (36.9 C)] 97.6 F (36.4 C) (03/30 0400) Pulse Rate:  [26-107] 26 (03/29 1900) Resp:  [21-29] 29 (03/30 0604) BP: (97-139)/(51-100) 139/92 mmHg (03/30 0604) SpO2:  [95 %-100 %] 98 % (03/29 2019) FiO2 (%):  [50 %] 50 % (03/30 0906) Weight:  [214 lb 1.1 oz (97.1 kg)-226 lb (102.513 kg)] 226 lb (102.513 kg) (03/30 0400)  Intake/Output from previous day:  Intake/Output Summary (Last 24 hours) at 07/01/14 1055 Last data filed at 06/30/14 1417  Gross per 24 hour  Intake      0 ml  Output   3000 ml  Net  -3000 ml    Physical Exam: General appearance: cooperative, mild distress and morbidly obese Lungs: decreased breath sounds Heart: regular rate and rhythm Abdomen: obese,diffusly tender, BS diminnished   Rate: 78  Rhythm: normal sinus rhythm  Lab Results:  Recent Labs  06/30/14 0358 06/30/14 1101 07/01/14 0318  WBC 32.4*  --  19.2*  HGB 12.4 16.0* 14.9  PLT 210  --  189    Recent Labs  06/30/14 0358 06/30/14 1101 06/30/14 1115 07/01/14 0318  NA 138 140  --  140  K >7.5* 3.6 3.4* 5.8*  CL 98 97  --  98  CO2 28  --   --  27  GLUCOSE 56* 104*  --  92  BUN 52* 32*  --  36*  CREATININE 4.81* 3.40*  --  4.41*   No results for input(s): TROPONINI in the last 72 hours.  Invalid input(s): CK, MB No results for input(s): INR in the last 72 hours.  Imaging: Imaging results have been reviewed  Cardiac Studies: TEE 06/23/14 Study Conclusions  - Left ventricle: Systolic function was normal. The estimated ejection fraction was in the range of 55% to 60%. Wall motion was normal; there were no regional wall motion abnormalities. - Aortic valve: No evidence of vegetation. There was trivial regurgitation. - Mitral valve: No evidence of vegetation. There was mild regurgitation. - Left atrium: No evidence of  thrombus in the atrial cavity or appendage. - Right ventricle: The cavity size was mildly dilated. Systolic function was severely reduced. - Right atrium: The atrium was severely dilated. - Atrial septum: The septum bowed from right to left, consistent with increased right atrial pressure. There was a patent foramen ovale. - Tricuspid valve: There was severe regurgitation. - Pulmonic valve: No evidence of vegetation.  Impressions:  - Normal LV function; mild RVE with severe RV dysfunction; severe RAE; severe TR; positive saline microcavitation study consisent with PFO; note patient with incessant atrial tachycardia during the procedure.  Telemetry:   (Palmira Stickle) I have reviewed telemetry today July 01, 2014. There is normal sinus rhythm.  Assessment/Plan:  59 year old female with end-stage renal disease, now on hemodialysis , (new this admission) hypertension, resolved acute hypoxemic respiratory failure secondary to the flu, who was noted to have weakness on her left side during hospital stay with CT scan demonstrating acute infarct of right anterior cerebral artery territory. TEE 06/23/14 showed small PFO by bubble study. Cardiology consulted 06/24/14 for arrhythmia. Telemetry revealed short bursts of narrow complex tachycardia that is regular, likely representative of supraventricular tachycardia, perhaps paroxysmal atrial tachycardia. This has remained stable the last 24 hrs.   Principal Problem:   Acute respiratory failure with hypoxia Active Problems:  Cerebral thrombosis with cerebral infarction   ESRD needing dialysis   Renal failure (ARF), acute on chronic   Hypoxia   Hypertension   Obesity (BMI 30-39.9)   H1N1 influenza   PSVT (paroxysmal supraventricular tachycardia)   SOB (shortness of breath)   Depression   Left renal mass   Chronic diastolic heart failure   Tobacco use disorder   Hyperlipidemia   PLAN: Same cardiac Rx. She is for HD again today. ?  Acute abdominal process work up per primary service.   Corine ShelterLuke Kilroy PA-C [facility] [phone] 07/01/2014, 10:55 AM  Patient seen and examined. I agree with the assessment and plan as detailed above. See also my additional thoughts below.   Cardiac status is stable. Telemetry continues to show normal sinus rhythm. No further cardiac workup today.  Willa RoughJeffrey Marty Sadlowski, MD, [facility] 07/01/2014 11:23 AM

## 2014-07-01 NOTE — Anesthesia Postprocedure Evaluation (Signed)
  Anesthesia Post-op Note  Patient: [name]  Procedure(s) Performed: Procedure(s): EXPLORATORY LAPAROTOMY WITH CLOSURE OF PERFORATED PYLORIC ULCER (N/A) SMALL BOWEL RESECTION (N/A)  Patient Location: PACU  Anesthesia Type:General  Level of Consciousness: sedated and Patient remains intubated per anesthesia plan  Airway and Oxygen Therapy: Patient remains intubated per anesthesia plan and Patient placed on Ventilator (see vital sign flow sheet for setting)  Post-op Pain: none  Post-op Assessment: Post-op Vital signs reviewed, Patient's Cardiovascular Status Stable, Respiratory Function Stable, Patent Airway and Pain level controlled  Post-op Vital Signs: stable  Last Vitals:  Filed Vitals:   07/01/14 1753  BP: 108/51  Pulse: 86  Temp:   Resp:     Complications: No apparent anesthesia complications

## 2014-07-01 NOTE — Progress Notes (Addendum)
CT chest/abd/pelvis findings reviewed.  Surgery has been consulted.  Will continue vancomycin, zosyn and add diflucan.  Will change from SDU to ICU transfer.  Will make NPO.  Hold ASA, allopurinol, phoslo, celexa, hectoral, vistaril lopressor, renavit, oxycodone.  Will change prednisone to stress dose solucortef.  Will d/c SQ heparin for now in anticipation that she will need surgical intervention for her acute abdomen.  D/w Dr. Mahala MenghiniSamtani.  Will change to PCCM service while she is in ICU.  Stephanie HellingVineet Yola Paradiso, MD [facility] 07/01/2014, 1:44 PM Pager:  [phone] After 3pm call: [phone]

## 2014-07-01 NOTE — Progress Notes (Signed)
[facility] Progress Note  Assessment/Plan: 1. PSVT - intermittent runs - on BB and asa - cards following- had episode during HD 3/26 that spontaneously resolved- seems OK 2. CVA/cryptogenic stroke - acute ACA, left hemiparesis; neuro following - seen by EP = rec 30 day event recorder to arrange at d/c and outpt sleep study- is she aspirating? Could agitation be a result of CVA ? 3. ESRD - new start this admission (sister was Newt LukesMyra Jarrell, [facility] problems with AVF 3/25 - had fistulagram 3/21 without intervention - showed focal aneurysmal dilitation and competing venous outflow in the deep venous system -VVS wanting to wait for her to recover before revision of AVF is considered -  - right IJ placed 3/26 - - catheter poorly functional  Initially but now better- will need to do HD again today via PC on 1 K bath 4. Anemia - Hgb good no need for ESA - repleting Fe 5. Secondary hyperparathyroidism - iPTH 338 - 3/21, 531 3/18 - started hectorol 1- also on phoslo but not eating 6. HTN/volume -  metoprolol 25 bid - given low BP's yest- have stopped norvasc- seems like can use more UF- will try for 3-4 liters with HD today.  Not sure if resp difficulty due to aspiration/pna vs fluid  7. Nutrition - alb 2.6 + vit 8. COPD/tobacco - encouraged smoking cessation. 9. S/p tx for Infuenza A/HIN1 10. Severe R HF per TEE  11. Renal mass per US 3/16 - "Indeterminate 1.9 cm hypoechoic mass off the interpolar region of the left kidney. Evaluation is markedly limited due to body habitus.This may potentially represent a cyst however solid mass is not excluded. Recommend follow-up in the outpatient setting with eitherpre and post enhanced CT or MRI." 12. DM - per primary BS 70 - 120 14. Increased WBC- pulm possibly vs steroids- is on big dose ? Talked with primary rapid steroid taper 15.  Dispo- certainly seems like patient has not thrived well with this whole thing.  Agree with discussions  with family regarding goals to search for other etiologies of WBC, agitation  [name] A   07/01/2014, 8:25 AM    Subjective:  Events noted- had dialysis yesterday removed 3 liters and K down in 3's.  Was noted to be agitated during HD.  This AM called by floor- resp difficulty- ABG a little hypoxic also c/o back pain- K is up again- difficult to get good history out of her  Objective Filed Vitals:   07/01/14 0002 07/01/14 0400 07/01/14 0402 07/01/14 0604  BP: 119/100  124/81 139/92  Pulse:      Temp:  97.6 F (36.4 C)    TempSrc:  Oral    Resp: 28  23 29   Height:      Weight:  102.513 kg (226 lb)    SpO2:       Physical Exam General: ill uncomfortable- agitated in bed  Heart: RRR Lungs: coarse BS clear some with cough Abdomen: soft + BS, epigastric tenderness -no rebound/guarding Extremities:1+ LE edema Dialysis Access: new right IJ and left upper AVF + bruit - bruising  Dialysis Orders: none - new start to HD  Additional Objective Labs: Basic Metabolic Panel:  Recent Labs Lab 06/24/14 1339  06/27/14 0249  06/29/14 0810  06/30/14 0358 06/30/14 1101 06/30/14 1115 07/01/14 0318  NA 139  < > 137  < > 131*  --  138 140  --  140  K 4.7  < >  5.0  < > 7.3*  < > >7.5* 3.6 3.4* 5.8*  CL 101  < > 101  < > 92*  --  98 97  --  98  CO2 27  < > 27  < > 20  --  28  --   --  27  GLUCOSE 75  < > 196*  < > 116*  --  56* 104*  --  92  BUN 75*  < > 73*  < > 84*  --  52* 32*  --  36*  CREATININE 4.33*  < > 4.32*  < > 6.15*  --  4.81* 3.40*  --  4.41*  CALCIUM 8.6  < > 8.2*  < > 9.2  --  8.6  --   --  8.9  PHOS 5.8*  --  5.6*  --   --   --   --   --   --   --   < > = values in this interval not displayed. Liver Function Tests:  Recent Labs Lab 06/27/14 0249 06/29/14 0810 07/01/14 0318  AST  --  40* 91*  ALT  --  47* 64*  ALKPHOS  --  72 105  BILITOT  --  2.2* 2.4*  PROT  --  6.1 6.7  ALBUMIN 2.4* 2.5* 2.5*   CBC:  Recent Labs Lab 06/26/14 0511  06/27/14 0249 06/29/14 0505 06/30/14 0358 06/30/14 1101 07/01/14 0318  WBC 8.2 9.5 32.8* 32.4*  --  19.2*  HGB 12.4 12.2 13.5 12.4 16.0* 14.9  HCT 39.5 38.5 41.5 37.9 47.0* 44.7  MCV 82.0 82.6 81.2 82.2  --  81.3  PLT 255 276 298 210  --  189   CBG:  Recent Labs Lab 06/28/14 2226  GLUCAP 93  Medications: . sodium chloride 10 mL/hr at 06/27/14 0659   . antiseptic oral rinse  7 mL Mouth Rinse BID  . aspirin  325 mg Oral Daily  . atorvastatin  10 mg Oral q1800  . calcium acetate  667 mg Oral TID WC  . citalopram  20 mg Oral Daily  . docusate sodium  200 mg Oral BID  . doxercalciferol  1 mcg Intravenous Q T,Th,Sa-HD  . feeding supplement (NEPRO CARB STEADY)  237 mL Oral TID BM  . fenofibrate  160 mg Oral Daily  . ferric gluconate (FERRLECIT/NULECIT) IV  125 mg Intravenous Q M,W,F-HD  . heparin subcutaneous  5,000 Units Subcutaneous 3 times per day  . ipratropium-albuterol  3 mL Nebulization TID  . metoprolol tartrate  25 mg Oral BID  . multivitamin  1 tablet Oral QHS  . predniSONE  40 mg Oral Q breakfast

## 2014-07-01 NOTE — Progress Notes (Addendum)
Called back to see pt for increased pain, waxing/waning mental status.  Pt tx to ICU.  D/w Dr. Derrell LollingIngram at bedside.  Family now here and updated at length by myself and Dr. Derrell LollingIngram.  Central line placed.  Plan for OR for exp lap ASAP.  Will f/u post op.   Stat f/u labs sent - lactic acid, cbc, cmet, pct, coags, type and cross  Of note - pt did not get HD today.  Will notify renal to assess for potential CVVHD if needed post op. Has perm cath.   Dirk DressKaty Whiteheart, NP 07/01/2014  3:11 PM Pager: [phone] or [phone]  Attending Note:  I have  reviewed labs, studies and notes. I have discussed the case with Jasper RilingK Whiteheart, and I agree with the data and plans as amended above. Greatly appreciate Dr Jacinto HalimIngram's assistance  Levy Pupaobert Dex Blakely, MD, PhD 07/01/2014, 4:44 PM [facility] [phone] or if no answer [phone]

## 2014-07-01 NOTE — Progress Notes (Addendum)
[name] PHE:[id number] DOB: Mar 07, 1956 DOA: 06/17/2014 PCP: [name], MD  Brief narrative: 59 y/o ? COPD, CKD stg 4 [seesn by Dr. Clelia Croft of oncology 10/2012 ? Myeloma work-up]prior to admit NOT ON DIALYSIS [followed nephrology @ [facility]], prior Tobacco use, Hld, admit to [facility] 3/16 c  Acute hypoxic resp failure in setting CKD stg 4 with L AV fistula [non functional] placed 01/2014 Dr. Monte Fantasia Met acidosis + Uremia]-found to have H1N1 on admission to [facility].  She was tx to [facility] as worsening metabolic parameters.  Found to have new R anterior Cerebral artery CVA 3/18 + SVT prompting multi-consults to Cardiology/Neurology-ECHO showed small PFO by bubble study + severe RV/RA. Hospitalization complicated by AVF malfunction 3/21=Aneurysmal dilatation-R IJ placed 3/26 as well as hypervolemia 2/2 to difficulty dialysing 2/2 to hypotension need for access.  She dialkysed last 06/30/14 Found to have Leucocytosis thotugh to be ? Aspiation vs Steroid use for COPD  Past medical history-As per Problem list Chart reviewed as below- reviewed  Consultants:  Cardiology  CCM  Nephrology  Procedures:  none  Antibiotics:  Cefuroxime 3/25  Cefepime 3/28-3/29  Vancomycin 3/28-3/29  Zosyn 3/30   Subjective  Called by RN this morning as patient lethargic Could not obtain O2 sat Seems to have had waxing and waning mentation during this hospital stay Not really eating or drinking No falls but not really ambulatory No other issue but did not appear well when I first saw her this morning   Objective    Interim History:   Telemetry: Sinus-sinus tachycardia   Objective: Filed Vitals:   06/30/14 2019 06/30/14 2126 07/01/14 0000 07/01/14 0400  BP:      Pulse:      Temp:  98.4 F (36.9 C) 97.7 F (36.5 C) 97.6 F (36.4 C)  TempSrc:  Oral Oral Oral  Resp:      Height:      Weight:    102.513 kg (226 lb)  SpO2: 98%       Intake/Output Summary (Last 24 hours) at 07/01/14 0741 Last  data filed at 06/30/14 1417  Gross per 24 hour  Intake      0 ml  Output   3000 ml  Net  -3000 ml    Exam:  General: EOMI, confused, moaning in pain--slightly icteric Cardiovascular: S1-S2 no murmur rub or gallop Respiratory: Clinically clear with decreased air entry bilaterally posteriorly Abdomen: Hematoma right lower abdomen, moaning and groaning with right upper quadrant pain on palpation and no hepatosplenomegaly Skin no lower extremity edema Neuro confused  Data Reviewed: Basic Metabolic Panel:  Recent Labs Lab 06/24/14 1334 06/24/14 1339  06/26/14 0731 06/27/14 0249 06/28/14 0400 06/29/14 0810  06/30/14 0358 06/30/14 1101 06/30/14 1115 07/01/14 0318  NA  --  139  < >  --  137 135 131*  --  138 140  --  140  K  --  4.7  < >  --  5.0 5.6* 7.3*  < > >7.5* 3.6 3.4* 5.8*  CL  --  101  < >  --  101 99 92*  --  98 97  --  98  CO2  --  27  < >  --  27 25 20   --  28  --   --  27  GLUCOSE  --  75  < >  --  196* 152* 116*  --  56* 104*  --  92  BUN  --  75*  < >  --  73* 77* 84*  --  52* 32*  --  36*  CREATININE  --  4.33*  < >  --  4.32* 5.13* 6.15*  --  4.81* 3.40*  --  4.41*  CALCIUM  --  8.6  < >  --  8.2* 8.3* 9.2  --  8.6  --   --  8.9  MG 2.0  --   --  2.0  --   --   --   --   --   --   --   --   PHOS  --  5.8*  --   --  5.6*  --   --   --   --   --   --   --   < > = values in this interval not displayed. Liver Function Tests:  Recent Labs Lab 06/24/14 1339 06/27/14 0249 06/29/14 0810 07/01/14 0318  AST  --   --  40* 91*  ALT  --   --  47* 64*  ALKPHOS  --   --  72 105  BILITOT  --   --  2.2* 2.4*  PROT  --   --  6.1 6.7  ALBUMIN 2.6* 2.4* 2.5* 2.5*   No results for input(s): LIPASE, AMYLASE in the last 168 hours. No results for input(s): AMMONIA in the last 168 hours. CBC:  Recent Labs Lab 06/26/14 0511 06/27/14 0249 06/29/14 0505 06/30/14 0358 06/30/14 1101 07/01/14 0318  WBC 8.2 9.5 32.8* 32.4*  --  19.2*  HGB 12.4 12.2 13.5 12.4 16.0* 14.9    HCT 39.5 38.5 41.5 37.9 47.0* 44.7  MCV 82.0 82.6 81.2 82.2  --  81.3  PLT 255 276 298 210  --  189   Cardiac Enzymes: No results for input(s): CKTOTAL, CKMB, CKMBINDEX, TROPONINI in the last 168 hours. BNP: Invalid input(s): POCBNP CBG:  Recent Labs Lab 06/24/14 0821 06/28/14 2226  GLUCAP 70 93    Recent Results (from the past 240 hour(s))  Clostridium Difficile by PCR     Status: None   Collection Time: 06/27/14  7:40 PM  Result Value Ref Range Status   C difficile by pcr NEGATIVE NEGATIVE Final  Culture, blood (routine x 2)     Status: None (Preliminary result)   Collection Time: 06/29/14  8:10 AM  Result Value Ref Range Status   Specimen Description BLOOD RIGHT ANTECUBITAL  Final   Special Requests BOTTLES DRAWN AEROBIC ONLY 3CC  Final   Culture   Final           BLOOD CULTURE RECEIVED NO GROWTH TO DATE CULTURE WILL BE HELD FOR 5 DAYS BEFORE ISSUING A FINAL NEGATIVE REPORT Performed at [facility]    Report Status PENDING  Incomplete  Culture, blood (routine x 2)     Status: None (Preliminary result)   Collection Time: 06/29/14  8:15 AM  Result Value Ref Range Status   Specimen Description BLOOD RIGHT HAND  Final   Special Requests BOTTLES DRAWN AEROBIC ONLY 2CC  Final   Culture   Final           BLOOD CULTURE RECEIVED NO GROWTH TO DATE CULTURE WILL BE HELD FOR 5 DAYS BEFORE ISSUING A FINAL NEGATIVE REPORT Note: Culture results may be compromised due to an inadequate volume of blood received in culture bottles. Performed at [facility]    Report Status PENDING  Incomplete     Studies:  All Imaging reviewed and is as per above notation   Scheduled Meds: . antiseptic oral rinse  7 mL Mouth Rinse BID  . aspirin  325 mg Oral Daily  . atorvastatin  10 mg Oral q1800  . calcium acetate  667 mg Oral TID WC  . citalopram  20 mg Oral Daily  . docusate sodium  200 mg Oral BID  . doxercalciferol  1 mcg Intravenous Q T,Th,Sa-HD  . feeding  supplement (NEPRO CARB STEADY)  237 mL Oral TID BM  . fenofibrate  160 mg Oral Daily  . ferric gluconate (FERRLECIT/NULECIT) IV  125 mg Intravenous Q M,W,F-HD  . heparin subcutaneous  5,000 Units Subcutaneous 3 times per day  . ipratropium-albuterol  3 mL Nebulization TID  . metoprolol tartrate  25 mg Oral BID  . multivitamin  1 tablet Oral QHS  . predniSONE  40 mg Oral Q breakfast   Continuous Infusions: . sodium chloride 10 mL/hr at 06/27/14 0659     Assessment/Plan: 1. Toxic metabolic encephalopathy-potentially secondary to hypoxia. Multiple other differentials include aspiration given recent history stroke. See below for further details 2. Acute hypoxic respiratory failure-waxing and waning--initial insult was H influenza. O2 sat on ABG this morning 70%. Difficult to obtain regular sats monitor. Chest x-ray 3/30 = left lower lobe collapse's with mild right lower lobe atelectasis, - edema. Patient should continue on 50% Venturi mask. Change to step down status. CT chest ordered a pulmonary physicians. Appreciate consult. 3. Abdominal pain, elevated LFTs-T bili 2.4, AST/ALT = 91/64--obtain CT abdomen pelvis rule out other intra-abdominal pathology. 4. ESRD now on dialysis-discussed in person with Dr. Britt Boozer dialyzes 3/29 and will dialyze again 3/30--access issues with left AV fistula to be addressed as an outpatient with Dr. Oneida Alar of vascular--defer management of metabolic bone disease and hyperkalemia 2 nephrology 5. Leukocytosis-multifactorial. Potential subclinical aspiration versus effect of prednisone 40 mg. I have rapidly tapered prednisone to 20 mg and will taper this off to off in the next 2-3 days.  Her leukocytosis has improved 30-->19 6. History COPD-see above discussion. In addition incentive spirometry, nebulizer 7. Acute CVA-MRI/MRA equal multifocal infarcts bilaterally, TEE normal LV, decreased RV function, + PFO. Negative lupus anticoagulant, - antiphospholipid  antibody-->continue aspirin 325 daily. If this patient survives this event, will consider loop/30 day event monitor per cardiology 8. Severe pulmonary hypertension, cor pulmonale-grade 3, usual management = diuretics, oxygen. Patient is noncompliant on oxygen intermittently which may explain the events of 3/30 a.m. 9. PSVT-outpatient monitoring. Poor candidate for amiodarone given significant burden of COPD. May be forced to use the same if this recurs 10. Hyperkalemia-see #4. For dialysis today 11. Coagulopathy-likely has elevated LFTs secondary to severe cor pulmonale. Unclear if we will get a diagnosis in any other way other than #1 liver biopsy #2 right-sided heart cath to determine pressures. Echocardiogram seems to suggest severe pulmonary hypertension which may be the cause for this. She is slightly jaundiced. We will follow CT abdomen pelvis 12. Constitutional hypotension in the setting of new onset ESRD-monitor. May be candidate for medication. 13. Hyperlipidemia-careful monitoring of LFTs  Code Status: Unclear Family Communication:  Langlinais,[name] Daughter [phone]  Called and extensively updated Disposition Plan: SDU status   >65 minutes intensive time at bedside and discussion with multiple consultants  Verneita Griffes, MD  [facility] Pager [phone] 07/01/2014, 7:41 AM    LOS: 14 days

## 2014-07-01 NOTE — Op Note (Addendum)
Patient Name:           [name]   Date of Surgery:        07/01/2014  Pre op Diagnosis:      Acute pneumoperitoneum and peritonitis  Post op Diagnosis:    Perforated pyloric ulcer with diffuse peritonitis, ischemic necrosis of mid ileum, possibly embolic  Procedure:                 Exploratory laparotomy, evacuation of ascites, closure of perforated pyloric ulcer with omental patch, segmental resection of 14 inches of ileum with primary anastomosis Doppler evaluation of small bowel mesenteric arterial vessels.  Surgeon:                     Angelia Mould. Derrell Lolling, M.D., FACS  Assistant:                      Violeta Gelinas, M.D., FACS  Operative Indications:   This is a 59 year old woman who has been hospitalized for the past 2 weeks with a multitude of medical problems. COPD requiring steroid treatment, renal failure requiring initiation of dialysis, acute encephalopathy, acute stroke this admission. Acute abdominal pain was noted this morning. Examination revealed a distended diffusely tender abdomen and somewhat obtunded patient. CT scan showed massive pneumoperitoneum and ascites and also showed pneumatosis of a short segment of ileum. She had been hypotensive in dialysis 3 days ago. She is brought to the operating room urgently following resuscitation and discussion with her son and daughter  Operative Findings:       There was almost 3 L of yellowish cloudy fluid without odor. There was diffuse peritonitis and exudate. There was a perforation of the anterior wall of the pylorus, which is presumably the cause of the ascites and pneumoperitoneum. In the mid ileum there was about a 10 inch segment of ischemic necrosis without perforation. The rest of the small bowel looked fine and was pink. I could Doppler pulses in the small bowel mesentery. I theorized that she may have had a subsegmental embolic event vs. Transient low flow state to a subsegmental portion of the small bowel. She required  pressor support in the operating room.  Procedure in Detail:          Following the induction of general endotracheal anesthesia, the anesthesia department place an arterial line in the right brachial artery and nasogastric tube in the stomach. A Foley catheter was placed revealing lots of cloudy urine. The abdomen was prepped and draped in a sterile fashion. Surgical timeout was performed.    Midline laparotomy was performed and the abdomen entered and explored with findings as described above. I irrigated out all of the cloudy yellow fluid and irrigated the abdomen numerous times. I ran the small bowel and identified the short segment of ischemic necrosis but there was no perforation there. I extended the incision above and found a perforated ulcer. I closed the ulcer with multiple transversely placed sutures of 2-0 silk and then I placed an omental patch over that and tied that down. I then resected the small bowel going 3 or 4 inches proximal and distal to the ischemic change. This was done with a GIA stapler. The mesentery was divided with the LigaSure device and the specimen removed. I used a Doppler to examine the pulses of the small bowel mesentery and they were intact with biphasic waveforms of both above and below the anastomosis.     I  chose to perform an anastomosis between the 2 loops of small bowel since it was viable and soft. This anastomosis was created with a GIA stapling device. The mucosa looked pink on both sides. There was no bleeding. The common defect was closed with a TA 60 stapling device. The anastomosis appeared to be watertight. I closed the mesentery with interrupted silk sutures. I ran the small bowel one more time and found no other abnormality. I irrigated out the abdomen with several liters of saline. TYhe NG was positioned. A 19 [place] Blake drain was placed from the right upper quadrant across the ulcer repair, sutured to the skin and connected to suction bulb. The midline  fascia was closed with a running suture of #1, double stranded PDS and some interrupted #1 Novafil. The skin was packed open with Kerlix.   The patient required vasopressor support. There were no complications of the surgical procedure. EBL 100 mL. Counts correct. The patient was taken on the ventilator back to the intensive care unit in critical condition     Angelia MouldHaywood M. Derrell LollingIngram, M.D., FACS General and Minimally Invasive Surgery Breast and Colorectal Surgery  07/01/2014 5:21 PM

## 2014-07-01 NOTE — Progress Notes (Addendum)
0700: patient tachypneic, O2 sats intermittently registering on monitor. Complaining of abdominal and back pain Rapid response called and Dr. Mahala MenghiniSamtani notified.  1100:  Patient more lethargic. But arousable, taking longer to answer orientation questions.  Dr. Mahala MenghiniSamtani and Rapid Response notified of patient's decline.  1300:  RN received CT result from Dr. Mahala MenghiniSamtani at bedside.  Patient to transfer to ICU

## 2014-07-01 NOTE — Evaluation (Addendum)
Clinical/Bedside Swallow Evaluation Patient Details  Name: [name] MRN: [id number] Date of Birth: 1955-09-10  Today's Date: 07/01/2014 Time: SLP Start Time (ACUTE ONLY): 0935 SLP Stop Time (ACUTE ONLY): 0958 SLP Time Calculation (min) (ACUTE ONLY): 23 min  Past Medical History:  Past Medical History  Diagnosis Date  . Asthma   . Hypertension   . Gout   . Arthritis   . Insomnia   . Chronic kidney disease   . Depression    Past Surgical History:  Past Surgical History  Procedure Laterality Date  . Multiple tooth extractions    . Av fistula placement Left 01/28/2014    Procedure: ARTERIOVENOUS (AV) FISTULA CREATION;  Surgeon: Sherren Kernsharles E Fields, MD;  [facility];  Service: Vascular;  Laterality: Left;  . Tee without cardioversion N/A 06/23/2014    Procedure: TRANSESOPHAGEAL ECHOCARDIOGRAM (TEE);  Surgeon: Lewayne BuntingBrian S Crenshaw, MD;  [facility];  Service: Cardiovascular;  Laterality: N/A;  . Insertion of dialysis catheter Right 06/27/2014    Procedure: INSERTION OF Right Internal Jugular DIATEK CATHETER;  Surgeon: Pryor OchoaJames D Lawson, MD;  [facility];  Service: Vascular;  Laterality: Right;   HPI:  Pt is a 59 y.o. female admitted to [facility] 3/16 for shortness of breath adn weakness x2 weeks, found to have H1N1 flu. [facility]; on 3/18 noted to be hemiparetic on L side- CT revealed R ACA, MRI revealed focal areas of acute infarct in both hemispheres. Pt had bedside swallow eval 3/21- regular diet/ thin liquids initiated. Pt had operation 3/26 for poorly functioning L arm AV fistula. Recent CXR 3/28 revealed LL collapse and mild RLL atelectasis. Bedside swallow eval ordered due to recent concerns for aspiration PNA.    Assessment / Plan / Recommendation Clinical Impression  Pt demonstrated no overt s/s of aspiration, despite experiencing significant abdominal pain during evaluation. MD and RN informed. Swallow was timely with thin liquids with  adequate hyolaryngeal excursion. Voice quality clear throughout. Unable to get clear read on O2 monitor until very end of evaluation- read fluctuating in 6880s- RN informed who questioned accuracy of the read. Pt is at increased risk of aspiration given current respiratory status; however currently not demonstrating any overt s/s of aspiration despite being distracted by abdominal pain throughout evaluation. Recommend continuing dysphagia 3 diet, thin liquids, meds whole in puree or crushed if difficulties arise. Full supervision to aide in feeding and ensure that pt has swallowed before providing more POs. Will continue to follow closely for diet tolerance/ advancement.    Aspiration Risk  Moderate    Diet Recommendation Dysphagia 3 (Mechanical Soft);Thin liquid   Liquid Administration via: Cup;Straw Medication Administration: Whole meds with puree Supervision: Staff to assist with self feeding;Full supervision/cueing for compensatory strategies Compensations: Slow rate;Small sips/bites;Check for pocketing Postural Changes and/or Swallow Maneuvers: Seated upright 90 degrees    Other  Recommendations Oral Care Recommendations: Oral care BID   Follow Up Recommendations  Other (comment) (TBD)    Frequency and Duration min 1 x/week  1 week   Pertinent Vitals/Pain Significant abdominal pain and O2s in 3080s- RN informed- questioned O2 read    SLP Swallow Goals     Swallow Study Prior Functional Status       General HPI: Pt is a 59 y.o. female [facility] for shortness of breath adn weakness x2 weeks, found to have H1N1 flu. [facility]; on 3/18 noted to be hemiparetic on L side-  CT revealed R ACA, MRI revealed focal areas of acute infarct in both hemispheres. Pt had bedside swallow eval 3/21- regular diet/ thin liquids initiated. Pt had operation 3/26 for poorly functioning L arm AV fistula. Recent CXR 3/28 revealed LLL collapse and mild RLL atelectasis.  Bedside swallow eval ordered due to recent concerns for aspiration PNA.  Type of Study: Bedside swallow evaluation Diet Prior to this Study: Dysphagia 3 (soft);Thin liquids Temperature Spikes Noted: No Respiratory Status: venti-mask History of Recent Intubation: No Behavior/Cognition: Alert;Cooperative;Other (comment) (yelling out- significant abdominal pain) Oral Cavity - Dentition: Adequate natural dentition Self-Feeding Abilities: Needs assist Patient Positioning: Upright in bed Baseline Vocal Quality: Clear Volitional Cough: Cognitively unable to elicit Volitional Swallow: Unable to elicit    Oral/Motor/Sensory Function Overall Oral Motor/Sensory Function: Other (comment) (difficult to assess- not following directions)   Ice Chips Ice chips: Not tested   Thin Liquid Thin Liquid: Within functional limits Presentation: Cup;Straw    Nectar Thick Nectar Thick Liquid: Not tested   Honey Thick Honey Thick Liquid: Not tested   Puree Puree: Within functional limits Presentation: Spoon   Solid   GO    Solid: Impaired Oral Phase Impairments: Other (comment) (pt distracted by pain- needed multiple cues to swallow) Pharyngeal Phase Impairments: Suspected delayed Swallow       Mykel Mohl K, MA, CCC-SLP 07/01/2014,10:02 AM  [phone]

## 2014-07-01 NOTE — Progress Notes (Signed)
ANTIBIOTIC CONSULT NOTE  Pharmacy Consult for Vancomycin and Cefepime to Zosyn Indication: rule out pneumonia and rule out sepsis  No Known Allergies  Labs:  Recent Labs  06/29/14 0505  06/30/14 0358 06/30/14 1101 07/01/14 0318  WBC 32.8*  --  32.4*  --  19.2*  HGB 13.5  --  12.4 16.0* 14.9  PLT 298  --  210  --  189  CREATININE  --   < > 4.81* 3.40* 4.41*  < > = values in this interval not displayed. Estimated Creatinine Clearance: 16.5 mL/min (by C-G formula based on Cr of 4.41).   Microbiology: Recent Results (from the past 720 hour(s))  MRSA PCR Screening     Status: None   Collection Time: 06/17/14  9:29 PM  Result Value Ref Range Status   MRSA by PCR NEGATIVE NEGATIVE Final    Comment:        The GeneXpert MRSA Assay (FDA approved for NASAL specimens only), is one component of a comprehensive MRSA colonization surveillance program. It is not intended to diagnose MRSA infection nor to guide or monitor treatment for MRSA infections.   Clostridium Difficile by PCR     Status: None   Collection Time: 06/18/14  3:52 AM  Result Value Ref Range Status   C difficile by pcr NEGATIVE NEGATIVE Final  Clostridium Difficile by PCR     Status: None   Collection Time: 06/27/14  7:40 PM  Result Value Ref Range Status   C difficile by pcr NEGATIVE NEGATIVE Final  Culture, blood (routine x 2)     Status: None (Preliminary result)   Collection Time: 06/29/14  8:10 AM  Result Value Ref Range Status   Specimen Description BLOOD RIGHT ANTECUBITAL  Final   Special Requests BOTTLES DRAWN AEROBIC ONLY 3CC  Final   Culture   Final           BLOOD CULTURE RECEIVED NO GROWTH TO DATE CULTURE WILL BE HELD FOR 5 DAYS BEFORE ISSUING A FINAL NEGATIVE REPORT Performed at [facility]    Report Status PENDING  Incomplete  Culture, blood (routine x 2)     Status: None (Preliminary result)   Collection Time: 06/29/14  8:15 AM  Result Value Ref Range Status   Specimen Description  BLOOD RIGHT HAND  Final   Special Requests BOTTLES DRAWN AEROBIC ONLY 2CC  Final   Culture   Final           BLOOD CULTURE RECEIVED NO GROWTH TO DATE CULTURE WILL BE HELD FOR 5 DAYS BEFORE ISSUING A FINAL NEGATIVE REPORT Note: Culture results may be compromised due to an inadequate volume of blood received in culture bottles. Performed at [facility]    Report Status PENDING  Incomplete    Assessment: 59yo female [facility], [facility] day, now on floor but w/ compromised O2 sats after breathing tx, new CXR shows possible PNA vs edema, to begin IV ABX.  Cefepime now switching to Zosyn for aspiration pneumonia  Goal of Therapy:  Pre-HD vanc level 15-25  Plan:  Vancomycin 500 mg iv x 1 dose after HD today Zosyn 2.25 grams iv Q 8 hours Continue to follow  Thank you. Okey RegalLisa Bluitt, PharmD [phone] 07/01/2014,9:49 AM

## 2014-07-01 NOTE — Progress Notes (Signed)
Events noted- CT showed pneumoperitoneum and clinically she has worsened over the last 24 hours.  Plan is for her to get very high risk surgery this afternoon/evening (quoted up to 50% chance that she may not survive the operation)  Given her lab abnormalities today most notably her K of 5.8- will plan for CRRT post op.    [name]

## 2014-07-01 NOTE — Transfer of Care (Signed)
Immediate Anesthesia Transfer of Care Note  Patient: [name]  Procedure(s) Performed: Procedure(s): EXPLORATORY LAPAROTOMY WITH CLOSURE OF PERFORATED PYLORIC ULCER (N/A) SMALL BOWEL RESECTION (N/A)  Patient Location: ICU  Anesthesia Type:General  Level of Consciousness: sedated, unresponsive and Patient remains intubated per anesthesia plan  Airway & Oxygen Therapy: Patient remains intubated per anesthesia plan and Patient placed on Ventilator (see vital sign flow sheet for setting)  Post-op Assessment: Report given to RN and Post -op Vital signs reviewed and stable  Post vital signs: Reviewed and stable  Last Vitals:  Filed Vitals:   07/01/14 1500  BP: 137/80  Pulse:   Temp:   Resp: 17    Complications: No apparent anesthesia complications

## 2014-07-01 NOTE — Progress Notes (Signed)
Occupational Therapy Treatment Patient Details Name: [name] MRN: [id number] DOB: Sep 17, 1955 Today's Date: 07/01/2014    History of present illness [name] is a 59 y.o. female with a history of CKD and hypertension came to the [facility] ED on 3/16 complaining of shortness of breath and generalized weakness x 2 weeks. Admitted with acute hypoxemic respiratory failure. During hospital stay noted to have weakness of her left side. A head CT was completed, imaging reviewed, it shows an acute infarct in the right anterior cerebral artery territory   OT comments  Pt limited by fatigue and BP and desat today. Pt with very poor tolerance of activity. Recommending LTACH.  Follow Up Recommendations  LTACH    Equipment Recommendations  Other (comment) (tbd in next venue)    Recommendations for Other Services      Precautions / Restrictions Precautions Precautions: Fall Restrictions Weight Bearing Restrictions: No       Mobility Bed Mobility Overal bed mobility: Needs Assistance;+2 for physical assistance Bed Mobility: Rolling;Sidelying to Sit;Sit to Supine Rolling: Mod assist;Max assist;+2 for physical assistance Sidelying to sit: Max assist;+2 for physical assistance   Sit to supine: +2 for physical assistance;Max assist   General bed mobility comments: Assist to progress LLE to EOB, however pt did assist a little. VC's to bring LEs to EOB and to reach across with RUE for bed rail. Pt rolled to her left side with use of bed rail and required max  A to elevate trunk off bed.   Transfers                 General transfer comment: unable    Balance Overall balance assessment: Needs assistance;History of Falls Sitting-balance support: Single extremity supported;Feet supported Sitting balance-Leahy Scale: Zero Sitting balance - Comments: Pt required max to total assist to sit EOB with pt pushing at times with [name].  Tried to assist with pt leaning on right elbow but  difficult due to pt with fatigue and impaired cognition.  Was able to get a little weight bearing on [name] in sitting. co treat with OT therefore OT attempted to get pt to wash her face with cloth and with assist pt minimally assisted.  Pt began to desat in low 80's therefore assisted pt to supine.  Once back in supine took incr time to position pt in bed on her left side.  Pt did try to comb hair and wash face.  OT tried to get pt to wash her perineal area but she did minimal and pt fatigued by this point and would not follow any further commands.                             ADL Overall ADL's : Needs assistance/impaired     Grooming: Wash/dry face;Brushing hair;Sitting;Bed level (see comments below)       Lower Body Bathing: Bed level;Total assistance Lower Body Bathing Details (indicate cue type and reason): pt participated minimally to wash peri area                       General ADL Comments: Pt in bed with gown halfway off body when OT arrived. OT helped pt and pt able to put right arm in gown with assist and cues. Sat pt EOB and then laid her back down due to BP and she was lethargic/fatigued. Repositioned pt in bed. While sitting EOB, OT tried  to get pt to wash her face, but was Max A as well as assist for balance. Once laying back down, pt able to comb hair and wash face without physical assist.  OT assisted in washing pt's peri area.      Vision                     Perception     Praxis      Cognition  Lethargic Behavior During Therapy: WFL for tasks assessed/performed Overall Cognitive Status: No family/caregiver present to determine baseline cognitive functioning                       Extremity/Trunk Assessment               Exercises     Shoulder Instructions       General Comments      Pertinent Vitals/ Pain       Pain Assessment: Faces Faces Pain Scale: Hurts whole lot Pain Location:  (unsure-when rolling pt she  yelled out) Pain Intervention(s): Monitored during session;Repositioned   HR 98-100 bpm; BP 179/132 in sitting. Upon lying pt down BP 147/133. Nursing aware. Sats dipped to 81-88% on 50 % venturi mask. Nursing aware.    Home Living                                          Prior Functioning/Environment              Frequency Min 3X/week     Progress Toward Goals  OT Goals(current goals can now be found in the care plan section)  Progress towards OT goals: Not progressing toward goals - comment  Acute Rehab OT Goals Patient Stated Goal: not stated OT Goal Formulation: With patient Time For Goal Achievement: 07/08/14 Potential to Achieve Goals: Good ADL Goals Pt Will Perform Grooming: with set-up;sitting Pt Will Perform Upper Body Bathing: with min assist;sitting Pt Will Perform Upper Body Dressing: with min assist;sitting Pt Will Transfer to Toilet: with mod assist;stand pivot transfer;bedside commode Additional ADL Goal #1: Pt will sit EOB with supervision x 10 minutes while engaged in ADL. Additional ADL Goal #2: Pt will protect [name] UE during bed mobility and position it in sitting and supine on pillows to reduce injury and edema with minimal verbal cues.  Plan Discharge plan needs to be updated    Co-evaluation    PT/OT/SLP Co-Evaluation/Treatment: Yes Reason for Co-Treatment: For patient/therapist safety PT goals addressed during session: Mobility/safety with mobility OT goals addressed during session: ADL's and self-care;Other (comment) (mobility)      End of Session Equipment Utilized During Treatment: Oxygen   Activity Tolerance Patient limited by fatigue;Patient limited by lethargy   Patient Left in bed;Other (comment) (about to be taken for test)   Nurse Communication Mobility status        Time: [id number] OT Time Calculation (min): 28 min  Charges: OT General Charges $OT Visit: 1 Procedure OT Treatments $Therapeutic Activity:  8-22 mins  Stephanie RavelingStraub, [name] OTR/[name] [phone] 07/01/2014, 2:24 PM

## 2014-07-01 NOTE — Consult Note (Signed)
Name: [name] MRN: [id number] DOB: 01-27-1956    ADMISSION DATE:  06/17/2014 CONSULTATION DATE:  3/30  REFERRING MD :  Mahala MenghiniSamtani   CHIEF COMPLAINT:  Hypoxia   BRIEF PATIENT DESCRIPTION: 59 yo female smoker with hx HTN, ESRD on HD (just began this admit), COPD, failed L AV fistula, hx COPD, initially admitted 3/16 with flu, acute hypoxic resp failure.  [facility] for worsening metabolic parameters and need for HD.  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  Has made little improvement and on 3/30 has worsening hypoxia, generalized pain, hyperkalemia, worsening leukocytosis and PCCM consulted.     SIGNIFICANT EVENTS    STUDIES:  3/22 TEE>>> severe RAE, severely reduced RV function, severe TR, no LAA thrombus    HISTORY OF PRESENT ILLNESS:  59 yo female smoker with hx HTN, ESRD on HD (just began this admit), COPD, failed L AV fistula, hx COPD, initially admitted 3/16 with flu, acute hypoxic resp failure.  [facility] worsening metabolic parameters and need for HD.  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  Has made little improvement and on 3/30 has worsening hypoxia, generalized pain, hyperkalemia, worsening leukocytosis and PCCM consulted.   C/o generalized and abd pain.  Denies chest pain, cough, hemoptysis, nausea, fevers.   PAST MEDICAL HISTORY :   has a past medical history of Asthma; Hypertension; Gout; Arthritis; Insomnia; Chronic kidney disease; and Depression.  has past surgical history that includes Multiple tooth extractions; AV fistula placement (Left, 01/28/2014); TEE without cardioversion (N/A, 06/23/2014); and Insertion of dialysis catheter (Right, 06/27/2014). Prior to Admission medications   Medication Sig Start Date End Date Taking? Authorizing Provider  allopurinol (ZYLOPRIM) 100 MG tablet Take 100 mg by mouth 2 (two) times daily as needed (for gout).   Yes Historical Provider, MD    citalopram (CELEXA) 20 MG tablet Take 20 mg by mouth daily.   Yes Historical Provider, MD  furosemide (LASIX) 80 MG tablet Take 80 mg by mouth daily.   Yes Historical Provider, MD  hydrOXYzine (ATARAX/VISTARIL) 25 MG tablet Take 25 mg by mouth 3 (three) times daily as needed for anxiety.   Yes Historical Provider, MD  oxyCODONE (ROXICODONE) 5 MG immediate release tablet Take 1 tablet (5 mg total) by mouth every 6 (six) hours as needed for severe pain. Patient not taking: Reported on 06/17/2014 01/28/14   Raymond GurneyKimberly A Trinh, PA-C   No Known Allergies  FAMILY HISTORY:  family history includes Diabetes in her father, mother, and sister; Heart attack in her mother and sister; Heart disease in her brother, father, mother, and sister; Hyperlipidemia in her daughter; Hypertension in her father, mother, and sister; Peripheral vascular disease in her mother and sister. SOCIAL HISTORY:  reports that she has been smoking Cigarettes.  She has a 10 pack-year smoking history. She has never used smokeless tobacco. She reports that she drinks alcohol. She reports that she does not use illicit drugs.  REVIEW OF SYSTEMS:   As per HPI - All other systems reviewed and were neg.    SUBJECTIVE:   VITAL SIGNS: Temp:  [97.6 F (36.4 C)-98.4 F (36.9 C)] 97.6 F (36.4 C) (03/30 0400) Pulse Rate:  [26-107] 26 (03/29 1900) Resp:  [20-29] 29 (03/30 0604) BP: (97-139)/(51-100) 139/92 mmHg (03/30 0604) SpO2:  [95 %-100 %] 98 % (03/29 2019) FiO2 (%):  [50 %] 50 % (03/30 0906)  Weight:  [214 lb 1.1 oz (97.1 kg)-226 lb (102.513 kg)] 226 lb (102.513 kg) (03/30 0400)  PHYSICAL EXAMINATION: General:  Chronically ill appearing female, uncomfortable appearing  Neuro:  Awake, answers questions, follows commands, moaning at times  HEENT:  Mm dry, venti mask, no JVD  Cardiovascular:  s1s2 rrr Lungs:  resps even non labored on venti mask, diminished bases L>R, few scattered rhonchi Abdomen:  Round, mildly distended,  tender, hypoactive bs  Musculoskeletal:  Warm and dry, no sig edema    Recent Labs Lab 06/29/14 0810  06/30/14 0358 06/30/14 1101 06/30/14 1115 07/01/14 0318  NA 131*  --  138 140  --  140  K 7.3*  < > >7.5* 3.6 3.4* 5.8*  CL 92*  --  98 97  --  98  CO2 20  --  28  --   --  27  BUN 84*  --  52* 32*  --  36*  CREATININE 6.15*  --  4.81* 3.40*  --  4.41*  GLUCOSE 116*  --  56* 104*  --  92  < > = values in this interval not displayed.  Recent Labs Lab 06/29/14 0505 06/30/14 0358 06/30/14 1101 07/01/14 0318  HGB 13.5 12.4 16.0* 14.9  HCT 41.5 37.9 47.0* 44.7  WBC 32.8* 32.4*  --  19.2*  PLT 298 210  --  189   Dg Chest Port 1 View  07/01/2014   CLINICAL DATA:  Dyspnea.  EXAM: PORTABLE CHEST - 1 VIEW  COMPARISON:  06/29/2014  FINDINGS: Right jugular dual-lumen catheter in the SVC unchanged. No pneumothorax.  Left lower lobe consolidation with volume loss unchanged. This is most consistent with collapse in the left lower lobe. Possible small left effusion. Right lower lobe atelectasis unchanged. Negative for edema.  IMPRESSION: Left lower lobe collapse remains unchanged. Mild right lower lobe atelectasis. Negative for edema.   Electronically Signed   By: Marlan Palau M.D.   On: 07/01/2014 07:48   Dg Chest Port 1 View  06/29/2014   CLINICAL DATA:  Acute onset of shortness of breath. Initial encounter.  EXAM: PORTABLE CHEST - 1 VIEW  COMPARISON:  Chest radiograph performed earlier today at 6:47 a.m.  FINDINGS: The lungs are relatively well expanded. Persistent retrocardiac airspace opacification may reflect asymmetric pulmonary edema or pneumonia. Underlying vascular congestion is again noted. A small left pleural effusion is suspected. No pneumothorax is seen.  The cardiomediastinal silhouette is borderline enlarged. A right-sided dual-lumen catheter is seen ending overlying the mid SVC. No acute osseous abnormalities are seen.  IMPRESSION: Persistent retrocardiac airspace opacification  may reflect asymmetric pulmonary edema or pneumonia. Underlying vascular congestion and borderline cardiomegaly again noted. Small left pleural effusion suspected.   Electronically Signed   By: Roanna Raider M.D.   On: 06/29/2014 23:03    ASSESSMENT / PLAN:  Acute hypoxemic respiratory failure - multifactorial initially r/t H1N1, volume overload in setting ESRD.   COPD  LLL Atelectasis/ collapse ?HCAP  ?OSA REC -  CT chest eval LLL  Aggressive pulm hygiene  Supplemental O2 as needed to maintain sats >92% BD's - atrovent, change to xopenex with previous PSVT Continue empiric abx for ?HCAP Volume removal per renal - for HD 3/30 Smoking cessation  Consider outpt sleep study at d/c  HR control  Taper steroids   PSVT  Chronic dCHF - EF 60-65%, grade 1 diastolic dysfunction HTN REC -  30 day event monitor at d/c  Monitor K, treat hyperkalemia as needed  with HD Low dose B blocker   Abd pain - CDiff neg, denies n/v/d. LFT's ok REC --  CT abd/pelvis 3/30 Empiric zosyn as above    ESRD  Hyperkalemia  Failed AV fistula - using temp HD cath  [facility] -  Per renal    Acute stroke REC -  Per primary     Dirk Dress, NP 07/01/2014  10:09 AM Pager: [phone] or [phone]  Reviewed above, examined.  59 yo female smoker presented with respiratory failure from the influenza.  Transferred to [facility] for HD.  Developed progressive hypoxia, abdominal pain and hyperkalemia.  She was started on Abx for possible HCAP/aspiration PNA.    She is lethargic, but follows commands.  She has decreased BS at Lt base.  Abd is soft, but diffusely tender.  She has mild elevation of LFT's, CXR shows Lt base atelectasis/consolidation, WBC remains elevated.    Continue current Abx, BD's, adjust oxygen to keep SpO2 > 92%.  Will arrange for CT chest/abd/pelvis.  Wean of prednisone per primary team.  Change to SDU status.  D/w Dr. Mahala Menghini.  Coralyn Helling, MD [facility] Pulmonary/Critical  Care 07/01/2014, 11:27 AM Pager:  [phone] After 3pm call: [phone]

## 2014-07-01 NOTE — Progress Notes (Addendum)
PT Note/D/C Note Pt seen by PT/OT this am for co-rx.  Upon writing PT note from this am, noted that pt now going for surgery and PT discontinued.  Goals of 3/22 unmet. Will sign off and await reorder as appropriate.  Thanks.  [facility] Grae Cannata,PT Acute Rehabilitation [phone] (pager)

## 2014-07-01 NOTE — Anesthesia Preprocedure Evaluation (Addendum)
Anesthesia Evaluation  Patient identified by MRN, date of birth, ID band Patient awake    Reviewed: Allergy & Precautions, NPO status , Patient's Chart, lab work & pertinent test results  Airway Mallampati: II  TM Distance: <3 FB Neck ROM: Full    Dental no notable dental hx.    Pulmonary shortness of breath, asthma , Current Smoker,  breath sounds clear to auscultation  Pulmonary exam normal       Cardiovascular hypertension, Pt. on medications + Peripheral Vascular Disease Rhythm:Regular Rate:Normal  Echo 06/23/2014 - Left ventricle: Systolic function was normal. The estimatedejection fraction was in the range of 55% to 60%. Wall motion wasnormal; there were no regional wall motion abnormalities. - Aortic valve: No evidence of vegetation. There was trivial regurgitation. - Mitral valve: No evidence of vegetation. There was mild regurgitation. - Left atrium: No evidence of thrombus in the atrial cavity orappendage. - Right ventricle: The cavity size was mildly dilated. Systolicfunction was severely reduced. - Right atrium: The atrium was severely dilated. - Atrial septum: The septum bowed from right to left, consistentwith increased right atrial pressure. There was a patent foramenovale; - Tricuspid valve: There was severe regurgitation. - Pulmonic valve: No evidence of vegetation.  Impressions: - Normal LV function; mild RVE with severe RV dysfunction; severeRAE; severe TR; positive saline microcavitation study consisentwith PFO; note patient with incessant atrial tachycardia duringthe procedure.   Neuro/Psych PSYCHIATRIC DISORDERS Depression CVA negative psych ROS   GI/Hepatic negative GI ROS, Neg liver ROS,   Endo/Other  Morbid obesity  Renal/GU ESRFRenal disease     Musculoskeletal negative musculoskeletal ROS (+) Arthritis -,   Abdominal   Peds  Hematology negative hematology ROS (+)    Anesthesia Other Findings   Reproductive/Obstetrics negative OB ROS                           Anesthesia Physical  Anesthesia Plan  ASA: IV and emergent  Anesthesia Plan: General   Post-op Pain Management:    Induction: Intravenous, Rapid sequence and Cricoid pressure planned  Airway Management Planned: Oral ETT  Additional Equipment: Arterial line  Intra-op Plan:   Post-operative Plan: Post-operative intubation/ventilation  Informed Consent: I have reviewed the patients History and Physical, chart, labs and discussed the procedure including the risks, benefits and alternatives for the proposed anesthesia with the patient or authorized representative who has indicated his/her understanding and acceptance.   Dental advisory given  Plan Discussed with: CRNA  Anesthesia Plan Comments:        Anesthesia Quick Evaluation

## 2014-07-01 NOTE — Anesthesia Procedure Notes (Signed)
Procedure Name: Intubation Date/Time: 07/01/2014 3:55 PM Performed by: Gwenyth AllegraADAMI, Johnothan Bascomb Pre-anesthesia Checklist: Emergency Drugs available, Patient identified, Timeout performed, Suction available and Patient being monitored Patient Re-evaluated:Patient Re-evaluated prior to inductionOxygen Delivery Method: Circle system utilized Preoxygenation: Pre-oxygenation with 100% oxygen Intubation Type: IV induction and Rapid sequence Laryngoscope Size: Mac and 3 Grade View: Grade I Tube type: Subglottic suction tube Tube size: 7.5 mm Number of attempts: 1 Airway Equipment and Method: Stylet Placement Confirmation: ETT inserted through vocal cords under direct vision,  breath sounds checked- equal and bilateral and positive ETCO2 Secured at: 21 cm Tube secured with: Tape Dental Injury: Teeth and Oropharynx as per pre-operative assessment

## 2014-07-01 NOTE — Progress Notes (Signed)
1030 - On follow up from earlier RRT call, pt sleepy but arousable and oriented, moaning and c/o abd pain.  Abd distended and tender to palpation.  CCM to bedside, orders for CT chest and abd/pelvis.  Pt hemodynamically stable and on venti mask at 50%.    1230 - pt to CT with Shanda BumpsJessica, primary RN.  Pt more lethargic, VSS.  Called CT for stat read of pt's CT re declining condition.  Radiologist speaking to ordering provider.    1300 - Pt with confirmed bowel perforation, surgery and CCM consults ordered, ICU transfer ordered.  Labs ordered, abx given.  Mental status unchanged, vitals stable on Venti mask.   Pt seen on 3W by surgery and CCM, transferred to 2H with primary RN.  Bedside handoff with Dois DavenportSandra, [facility] RN.

## 2014-07-01 NOTE — Progress Notes (Signed)
ANTIBIOTIC CONSULT NOTE - FOLLOW UP  Pharmacy Consult:  Vancomycin / Zosyn / Diflucan Indication:  PNA  No Known Allergies  Patient Measurements: Height:  (165.1 cm) Weight: 226 lb (102.513 kg) IBW/kg (Calculated) : 57  Vital Signs: Temp: 97.7 F (36.5 C) (03/30 1200) Temp Source: Axillary (03/30 1200) BP: 108/51 mmHg (03/30 1753) Pulse Rate: 86 (03/30 1753) Intake/Output from previous day: 03/29 0701 - 03/30 0700 In: 0  Out: 3000  Intake/Output from this shift: Total I/O In: 1400 [I.V.:650; IV Piggyback:750] Out: -   Labs:  Recent Labs  06/30/14 0358  07/01/14 0318 07/01/14 1445 07/01/14 1532  WBC 32.4*  --  19.2* 20.2*  --   HGB 12.4  < > 14.9 13.4 16.0*  PLT 210  --  189 154  --   CREATININE 4.81*  < > 4.41* 5.42* 5.70*  < > = values in this interval not displayed. Estimated Creatinine Clearance: 12.8 mL/min (by C-G formula based on Cr of 5.7). No results for input(s): VANCOTROUGH, VANCOPEAK, VANCORANDOM, GENTTROUGH, GENTPEAK, GENTRANDOM, TOBRATROUGH, TOBRAPEAK, TOBRARND, AMIKACINPEAK, AMIKACINTROU, AMIKACIN in the last 72 hours.   Microbiology: Recent Results (from the past 720 hour(s))  MRSA PCR Screening     Status: None   Collection Time: 06/17/14  9:29 PM  Result Value Ref Range Status   MRSA by PCR NEGATIVE NEGATIVE Final    Comment:        The GeneXpert MRSA Assay (FDA approved for NASAL specimens only), is one component of a comprehensive MRSA colonization surveillance program. It is not intended to diagnose MRSA infection nor to guide or monitor treatment for MRSA infections.   Clostridium Difficile by PCR     Status: None   Collection Time: 06/18/14  3:52 AM  Result Value Ref Range Status   C difficile by pcr NEGATIVE NEGATIVE Final  Clostridium Difficile by PCR     Status: None   Collection Time: 06/27/14  7:40 PM  Result Value Ref Range Status   C difficile by pcr NEGATIVE NEGATIVE Final  Culture, blood (routine x 2)     Status:  None (Preliminary result)   Collection Time: 06/29/14  8:10 AM  Result Value Ref Range Status   Specimen Description BLOOD RIGHT ANTECUBITAL  Final   Special Requests BOTTLES DRAWN AEROBIC ONLY 3CC  Final   Culture   Final           BLOOD CULTURE RECEIVED NO GROWTH TO DATE CULTURE WILL BE HELD FOR 5 DAYS BEFORE ISSUING A FINAL NEGATIVE REPORT Performed at [facility]    Report Status PENDING  Incomplete  Culture, blood (routine x 2)     Status: None (Preliminary result)   Collection Time: 06/29/14  8:15 AM  Result Value Ref Range Status   Specimen Description BLOOD RIGHT HAND  Final   Special Requests BOTTLES DRAWN AEROBIC ONLY 2CC  Final   Culture   Final           BLOOD CULTURE RECEIVED NO GROWTH TO DATE CULTURE WILL BE HELD FOR 5 DAYS BEFORE ISSUING A FINAL NEGATIVE REPORT Note: Culture results may be compromised due to an inadequate volume of blood received in culture bottles. Performed at [facility]    Report Status PENDING  Incomplete  MRSA PCR Screening     Status: None   Collection Time: 07/01/14  2:49 PM  Result Value Ref Range Status   MRSA by PCR NEGATIVE NEGATIVE Final    Comment:  The GeneXpert MRSA Assay (FDA approved for NASAL specimens only), is one component of a comprehensive MRSA colonization surveillance program. It is not intended to diagnose MRSA infection nor to guide or monitor treatment for MRSA infections.       Assessment: 58 YOF continues on vancomycin, Zosyn, and fluconazole for PNA and peritonitis.  Patient is s/p ex-lap with evacuation of ascites, closure of perforated pyloric ulcer and resection of ileum with primary anastomosis.  She was previously on iHD and now to transition to CRRT.  Noted patient received vancomycin 500mg  IV x 1 around 1030 and Zosyn 2.25gm IV x 1 around 1330 today.  Vanc 3/29 >> Cefepime 3/29 >> 3/30 Zosyn 3/30 >> Diflucan 3/30 >>   Goal of Therapy:  Vancomycin trough level 15-20  mcg/ml   Plan:  - Vanc 1gm IV Q24H, start tomorrow - Zosyn 3.375gm IV Q6H - Change fluconazole to 400mg  IV Q24H    Paige Monarrez D. Laney Potashang, PharmD, BCPS Pager:  [phone] 07/01/2014, 6:51 PM

## 2014-07-01 NOTE — Progress Notes (Addendum)
Greatly appreciate multi-disciplinary involvement.    I have added Pro-calcitonin algo/lactic acid/bmet stat empirically give Kayexalate rectally  May dose low dose morphine until seen by gen surgery Consider  fluids to LR if compatible c Zosyn D/w Dr. Kathrene BongoGoldsborough who is aware new findings-Will arrange for CRRT while in unit   Called, updated  Rawson,[name] Daughter [phone]   about new findings Multiple q's answered She will be on her way to the hospital soon  Care ceded to Dr. Craige CottaSood.  Pleas KochJai Lea Baine, MD [facility] [phone]

## 2014-07-01 NOTE — Progress Notes (Signed)
Physical Therapy Treatment Patient Details Name: [name] [name] MRN: [id number] DOB: 1955-08-14 Today's Date: 07/01/2014    History of Present Illness [name] [name] is a 59 y.o. female with a history of CKD and hypertension [facility] complaining of shortness of breath and generalized weakness x 2 weeks. Admitted with acute hypoxemic respiratory failure. During hospital stay noted to have weakness of her left side. 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 Pt currently with functional limitations due to balance and endurance deficits significantly limiting progression.  Pt limited by fatigue and BP as well as desat today.  Very poor tolerance of activity therefore feel that LTAC a more appropriate venue.    Pt will benefit from skilled PT to increase their independence and safety with mobility to allow discharge to the venue listed below.    Follow Up Recommendations  LTACH;Supervision/Assistance - 24 hour     Equipment Recommendations  Other (comment) (TBA)    Recommendations for Other Services       Precautions / Restrictions Precautions Precautions: Fall Restrictions Weight Bearing Restrictions: No    Mobility  Bed Mobility Overal bed mobility: Needs Assistance;+2 for physical assistance Bed Mobility: Rolling;Sidelying to Sit Rolling: Mod assist;Max assist;+2 for physical assistance Sidelying to sit: Max assist;+2 for physical assistance       General bed mobility comments: Assist to progress LLE to EOB, however pt did assist a little. VC's to bring LEs to EOB and to reach across with RUE for bed rail. Pt rolled to her left side with use of bed rail and required max  A to elevate trunk off bed.   Transfers                 General transfer comment: unable  Ambulation/Gait                 Stairs             Wheelchair Mobility    Modified Rankin (Stroke Patients Only)       Balance Overall balance assessment: Needs assistance;History of Falls Sitting-balance support: Single extremity supported;Feet supported Sitting balance-Leahy Scale: Zero Sitting balance - Comments: Pt required max to total assist to sit EOB with pt pushing at times with [name].  Tried to assist with pt leaning on right elbow but difficult due to pt with fatigue and impaired cognition.  Was able to get a little weight bearing on [name] in sitting. co treat with OT therefore OT attempted to get pt to wash her face with cloth and with assist pt minimally assisted.  Pt began to desat in low 80's therefore assisted pt to supine.  Once back in supine took incr time to position pt in bed on her left side.  Pt did try to comb hair and wash face.  OT tried to get pt to wash her perineal area but pt fatigued by this point and would not follow any further commands.                              Cognition Arousal/Alertness: Lethargic Behavior During Therapy: WFL for tasks assessed/performed Overall Cognitive Status: No family/caregiver present to determine baseline cognitive functioning  Exercises      General Comments        Pertinent Vitals/Pain Pain Assessment: No/denies pain  HR 98-100 bpm; BP 179/132 in sitting.  Upon lying pt down BP 147/133.  Nursing aware.  Sats dipped to 81-88% on 50 % venturi mask.  Nursing aware.     Home Living                      Prior Function            PT Goals (current goals can now be found in the care plan section) Acute Rehab PT Goals Patient Stated Goal: not stated Progress towards PT goals: Not progressing toward goals - comment (poor tolerance of activity)    Frequency  Min 3X/week    PT Plan Discharge plan needs to be updated    Co-evaluation PT/OT/SLP Co-Evaluation/Treatment: Yes Reason for Co-Treatment: For  patient/therapist safety PT goals addressed during session: Mobility/safety with mobility       End of Session   Activity Tolerance: Patient limited by fatigue Patient left: in bed;with call bell/phone within reach     Time: 1130-1159 PT Time Calculation (min) (ACUTE ONLY): 29 min  Charges:  $Therapeutic Activity: 8-22 mins                    G CodesTawni [name] F 07-15-14, 2:00 PM [employer] Acute Rehabilitation [phone] (pager)

## 2014-07-02 ENCOUNTER — Encounter (HOSPITAL_COMMUNITY): Payer: Self-pay | Admitting: General Surgery

## 2014-07-02 ENCOUNTER — Inpatient Hospital Stay (HOSPITAL_COMMUNITY): Payer: Medicaid Other

## 2014-07-02 DIAGNOSIS — I471 Supraventricular tachycardia: Secondary | ICD-10-CM

## 2014-07-02 DIAGNOSIS — I5032 Chronic diastolic (congestive) heart failure: Secondary | ICD-10-CM

## 2014-07-02 LAB — RENAL FUNCTION PANEL
ALBUMIN: 2.2 g/dL — AB (ref 3.5–5.2)
Anion gap: 9 (ref 5–15)
BUN: 25 mg/dL — ABNORMAL HIGH (ref 6–23)
CO2: 25 mmol/L (ref 19–32)
CREATININE: 2.71 mg/dL — AB (ref 0.50–1.10)
Calcium: 8.2 mg/dL — ABNORMAL LOW (ref 8.4–10.5)
Chloride: 103 mmol/L (ref 96–112)
GFR calc Af Amer: 21 mL/min — ABNORMAL LOW (ref >90)
GFR calc non Af Amer: 18 mL/min — ABNORMAL LOW (ref >90)
Glucose, Bld: 102 mg/dL — ABNORMAL HIGH (ref 70–99)
PHOSPHORUS: 4.1 mg/dL (ref 2.3–4.6)
Potassium: 4.3 mmol/L (ref 3.5–5.1)
Sodium: 137 mmol/L (ref 135–145)

## 2014-07-02 LAB — CBC WITH DIFFERENTIAL/PLATELET
BASOS ABS: 0 10*3/uL (ref 0.0–0.1)
BASOS PCT: 0 % (ref 0–1)
Eosinophils Absolute: 0 10*3/uL (ref 0.0–0.7)
Eosinophils Relative: 0 % (ref 0–5)
HEMATOCRIT: 35.1 % — AB (ref 36.0–46.0)
Hemoglobin: 11.1 g/dL — ABNORMAL LOW (ref 12.0–15.0)
Lymphocytes Relative: 5 % — ABNORMAL LOW (ref 12–46)
Lymphs Abs: 0.7 10*3/uL (ref 0.7–4.0)
MCH: 25.7 pg — ABNORMAL LOW (ref 26.0–34.0)
MCHC: 31.6 g/dL (ref 30.0–36.0)
MCV: 81.3 fL (ref 78.0–100.0)
Monocytes Absolute: 1.5 10*3/uL — ABNORMAL HIGH (ref 0.1–1.0)
Monocytes Relative: 11 % (ref 3–12)
NEUTROS ABS: 11.4 10*3/uL — AB (ref 1.7–7.7)
Neutrophils Relative %: 84 % — ABNORMAL HIGH (ref 43–77)
PLATELETS: 116 10*3/uL — AB (ref 150–400)
RBC: 4.32 MIL/uL (ref 3.87–5.11)
RDW: 19.3 % — AB (ref 11.5–15.5)
WBC: 13.6 10*3/uL — AB (ref 4.0–10.5)

## 2014-07-02 LAB — COMPREHENSIVE METABOLIC PANEL
ALBUMIN: 2.4 g/dL — AB (ref 3.5–5.2)
ALT: 455 U/L — ABNORMAL HIGH (ref 0–35)
ANION GAP: 11 (ref 5–15)
AST: 757 U/L — AB (ref 0–37)
Alkaline Phosphatase: 65 U/L (ref 39–117)
BUN: 34 mg/dL — AB (ref 6–23)
CALCIUM: 8.1 mg/dL — AB (ref 8.4–10.5)
CO2: 23 mmol/L (ref 19–32)
CREATININE: 3.33 mg/dL — AB (ref 0.50–1.10)
Chloride: 103 mmol/L (ref 96–112)
GFR calc Af Amer: 16 mL/min — ABNORMAL LOW (ref >90)
GFR calc non Af Amer: 14 mL/min — ABNORMAL LOW (ref >90)
Glucose, Bld: 128 mg/dL — ABNORMAL HIGH (ref 70–99)
Potassium: 4.5 mmol/L (ref 3.5–5.1)
SODIUM: 137 mmol/L (ref 135–145)
Total Bilirubin: 2.8 mg/dL — ABNORMAL HIGH (ref 0.3–1.2)
Total Protein: 5.2 g/dL — ABNORMAL LOW (ref 6.0–8.3)

## 2014-07-02 LAB — GLUCOSE, CAPILLARY: GLUCOSE-CAPILLARY: 157 mg/dL — AB (ref 70–99)

## 2014-07-02 LAB — PROCALCITONIN: Procalcitonin: 135.4 ng/mL

## 2014-07-02 LAB — APTT: APTT: 35 s (ref 24–37)

## 2014-07-02 LAB — MAGNESIUM: Magnesium: 2.1 mg/dL (ref 1.5–2.5)

## 2014-07-02 LAB — PROTIME-INR
INR: 1.51 — ABNORMAL HIGH (ref 0.00–1.49)
Prothrombin Time: 18.3 seconds — ABNORMAL HIGH (ref 11.6–15.2)

## 2014-07-02 LAB — PHOSPHORUS: Phosphorus: 5.8 mg/dL — ABNORMAL HIGH (ref 2.3–4.6)

## 2014-07-02 MED ORDER — PANTOPRAZOLE SODIUM 40 MG IV SOLR
40.0000 mg | Freq: Two times a day (BID) | INTRAVENOUS | Status: DC
  Administered 2014-07-02 – 2014-07-15 (×28): 40 mg via INTRAVENOUS
  Filled 2014-07-02 (×32): qty 40

## 2014-07-02 MED ORDER — TRACE MINERALS CR-CU-F-FE-I-MN-MO-SE-ZN IV SOLN
INTRAVENOUS | Status: AC
  Administered 2014-07-02: 18:00:00 via INTRAVENOUS
  Filled 2014-07-02: qty 960

## 2014-07-02 MED ORDER — INSULIN ASPART 100 UNIT/ML ~~LOC~~ SOLN
0.0000 [IU] | SUBCUTANEOUS | Status: DC
  Administered 2014-07-02 – 2014-07-03 (×3): 2 [IU] via SUBCUTANEOUS
  Administered 2014-07-03 (×2): 1 [IU] via SUBCUTANEOUS
  Administered 2014-07-03 (×2): 2 [IU] via SUBCUTANEOUS
  Administered 2014-07-04: 3 [IU] via SUBCUTANEOUS
  Administered 2014-07-04: 2 [IU] via SUBCUTANEOUS
  Administered 2014-07-04: 3 [IU] via SUBCUTANEOUS
  Administered 2014-07-04: 2 [IU] via SUBCUTANEOUS
  Administered 2014-07-04: 1 [IU] via SUBCUTANEOUS
  Administered 2014-07-04: 3 [IU] via SUBCUTANEOUS
  Administered 2014-07-05: 2 [IU] via SUBCUTANEOUS
  Administered 2014-07-05: 1 [IU] via SUBCUTANEOUS
  Administered 2014-07-05 – 2014-07-06 (×4): 2 [IU] via SUBCUTANEOUS
  Administered 2014-07-06 (×2): 1 [IU] via SUBCUTANEOUS
  Administered 2014-07-06: 2 [IU] via SUBCUTANEOUS
  Administered 2014-07-07: 3 [IU] via SUBCUTANEOUS
  Administered 2014-07-07: 1 [IU] via SUBCUTANEOUS
  Administered 2014-07-07: 2 [IU] via SUBCUTANEOUS
  Administered 2014-07-07: 1 [IU] via SUBCUTANEOUS
  Administered 2014-07-07 – 2014-07-08 (×5): 2 [IU] via SUBCUTANEOUS
  Administered 2014-07-08: 1 [IU] via SUBCUTANEOUS
  Administered 2014-07-08 – 2014-07-09 (×4): 2 [IU] via SUBCUTANEOUS
  Administered 2014-07-09: 1 [IU] via SUBCUTANEOUS

## 2014-07-02 MED ORDER — CETYLPYRIDINIUM CHLORIDE 0.05 % MT LIQD
7.0000 mL | OROMUCOSAL | Status: DC
  Administered 2014-07-02 – 2014-07-05 (×32): 7 mL via OROMUCOSAL

## 2014-07-02 NOTE — Progress Notes (Signed)
1 Day Post-Op  Subjective: Sedated. Ventilator dependent. Does arouse and squeeze hand to command. Moves right foot to command. On CVVH. Remains on Levophed. BP 108/51. Heart rate 76. Temp low, 95.5, on active warming. On high-dose steroids On vancomycin, Zosyn, Diflucan.--- Peritoneal cultures in process.  Morning labs pending  Foley placed in OR due to bladder distention. Minimal output now.  Objective: Vital signs in last 24 hours: Temp:  [94.6 F (34.8 C)-97.7 F (36.5 C)] 95.5 F (35.3 C) (03/31 0400) Pulse Rate:  [73-86] 76 (03/31 0400) Resp:  [17-29] 17 (03/30 1500) BP: (108-139)/(51-105) 108/51 mmHg (03/30 1753) SpO2:  [98 %-100 %] 100 % (03/31 0400) Arterial Line BP: (99-133)/(54-73) 133/72 mmHg (03/31 0400) FiO2 (%):  [50 %-100 %] 60 % (03/31 0321) Weight:  [96.2 kg (212 lb 1.3 oz)] 96.2 kg (212 lb 1.3 oz) (03/31 0215) Last BM Date: 06/28/14  Intake/Output from previous day: 03/30 0701 - 03/31 0700 In: 2799.7 [I.V.:1759.7; NG/GT:40; IV Piggyback:1000] Out: 1571 [Urine:50; Drains:80] Intake/Output this shift: Total I/O In: 1399.7 [I.V.:1109.7; NG/GT:40; IV Piggyback:250] Out: 1571 [Urine:50; Drains:80; Other:1441]  General appearance: Sedated. Ventilator dependent. Arousable and does move right hand and right foot to command. Resp: Coarse breath sounds bilaterally. Minimal secretions. GI: Soft. Appropriately tender. Midline wound clean and dry. JP drainage serosanguineous.  Lab Results:  Results for orders placed or performed during the hospital encounter of 06/17/14 (from the past 24 hour(s))  Blood gas, arterial     Status: Abnormal   Collection Time: 07/01/14  7:20 AM  Result Value Ref Range   FIO2 0.50 %   Delivery systems VENTURI MASK    pH, Arterial 7.366 7.350 - 7.450   pCO2 arterial 42.1 35.0 - 45.0 mmHg   pO2, Arterial 70.6 (L) 80.0 - 100.0 mmHg   Bicarbonate 23.6 20.0 - 24.0 mEq/L   TCO2 24.8 0 - 100 mmol/L   Acid-base deficit 1.0 0.0 - 2.0  mmol/L   O2 Saturation 90.5 %   Patient temperature 98.6    Collection site RIGHT RADIAL    Drawn by [phone]    Sample type ARTERIAL DRAW    Allens test (pass/fail) PASS PASS  Glucose, capillary     Status: Abnormal   Collection Time: 07/01/14  1:26 PM  Result Value Ref Range   Glucose-Capillary 100 (H) 70 - 99 mg/dL  Procalcitonin - Baseline     Status: None   Collection Time: 07/01/14  2:45 PM  Result Value Ref Range   Procalcitonin >200.00 ng/mL  Lactic acid, plasma     Status: Abnormal   Collection Time: 07/01/14  2:45 PM  Result Value Ref Range   Lactic Acid, Venous 3.3 (HH) 0.5 - 2.0 mmol/L  Basic metabolic panel     Status: Abnormal   Collection Time: 07/01/14  2:45 PM  Result Value Ref Range   Sodium 138 135 - 145 mmol/L   Potassium 5.8 (H) 3.5 - 5.1 mmol/L   Chloride 96 96 - 112 mmol/L   CO2 24 19 - 32 mmol/L   Glucose, Bld 134 (H) 70 - 99 mg/dL   BUN 54 (H) 6 - 23 mg/dL   Creatinine, Ser 6.04 (H) 0.50 - 1.10 mg/dL   Calcium 9.1 8.4 - 54.0 mg/dL   GFR calc non Af Amer 8 (L) >90 mL/min   GFR calc Af Amer 9 (L) >90 mL/min   Anion gap 18 (H) 5 - 15  CBC with Differential     Status: Abnormal  Collection Time: 07/01/14  2:45 PM  Result Value Ref Range   WBC 20.2 (H) 4.0 - 10.5 K/uL   RBC 5.05 3.87 - 5.11 MIL/uL   Hemoglobin 13.4 12.0 - 15.0 g/dL   HCT 30.8 65.7 - 84.6 %   MCV 82.6 78.0 - 100.0 fL   MCH 26.5 26.0 - 34.0 pg   MCHC 32.1 30.0 - 36.0 g/dL   RDW 96.2 (H) 95.2 - 84.1 %   Platelets 154 150 - 400 K/uL   Neutrophils Relative % 88 (H) 43 - 77 %   Lymphocytes Relative 8 (L) 12 - 46 %   Monocytes Relative 4 3 - 12 %   Eosinophils Relative 0 0 - 5 %   Basophils Relative 0 0 - 1 %   Band Neutrophils 0 0 - 10 %   Metamyelocytes Relative 0 %   Myelocytes 0 %   Promyelocytes Absolute 0 %   Blasts 0 %   nRBC 5 (H) 0 /100 WBC   Neutro Abs 17.8 (H) 1.7 - 7.7 K/uL   Lymphs Abs 1.6 0.7 - 4.0 K/uL   Monocytes Absolute 0.8 0.1 - 1.0 K/uL   Eosinophils Absolute  0.0 0.0 - 0.7 K/uL   Basophils Absolute 0.0 0.0 - 0.1 K/uL   RBC Morphology RARE NRBCs    WBC Morphology INCREASED BANDS (>20% BANDS)   Protime-INR     Status: Abnormal   Collection Time: 07/01/14  2:45 PM  Result Value Ref Range   Prothrombin Time 17.7 (H) 11.6 - 15.2 seconds   INR 1.44 0.00 - 1.49  MRSA PCR Screening     Status: None   Collection Time: 07/01/14  2:49 PM  Result Value Ref Range   MRSA by PCR NEGATIVE NEGATIVE  Type and screen     Status: None   Collection Time: 07/01/14  3:20 PM  Result Value Ref Range   ABO/RH(D) B POS    Antibody Screen NEG    Sample Expiration 07/04/2014   ABO/Rh     Status: None   Collection Time: 07/01/14  3:20 PM  Result Value Ref Range   ABO/RH(D) B POS   I-STAT, chem 8     Status: Abnormal   Collection Time: 07/01/14  3:32 PM  Result Value Ref Range   Sodium 136 135 - 145 mmol/L   Potassium 5.7 (H) 3.5 - 5.1 mmol/L   Chloride 96 96 - 112 mmol/L   BUN 60 (H) 6 - 23 mg/dL   Creatinine, Ser 3.24 (H) 0.50 - 1.10 mg/dL   Glucose, Bld 401 (H) 70 - 99 mg/dL   Calcium, Ion 0.27 2.53 - 1.23 mmol/L   TCO2 22 0 - 100 mmol/L   Hemoglobin 16.0 (H) 12.0 - 15.0 g/dL   HCT 66.4 (H) 40.3 - 47.4 %  I-STAT 7, (LYTES, BLD GAS, ICA, H+H)     Status: Abnormal   Collection Time: 07/01/14  3:59 PM  Result Value Ref Range   pH, Arterial 7.238 (L) 7.350 - 7.450   pCO2 arterial 64.0 (HH) 35.0 - 45.0 mmHg   pO2, Arterial 82.0 80.0 - 100.0 mmHg   Bicarbonate 27.3 (H) 20.0 - 24.0 mEq/L   TCO2 29 0 - 100 mmol/L   O2 Saturation 93.0 %   Acid-base deficit 2.0 0.0 - 2.0 mmol/L   Sodium 133 (L) 135 - 145 mmol/L   Potassium 5.8 (H) 3.5 - 5.1 mmol/L   Calcium, Ion 1.10 (L) 1.12 - 1.23 mmol/L  HCT 43.0 36.0 - 46.0 %   Hemoglobin 14.6 12.0 - 15.0 g/dL   Sample type ARTERIAL   Lactic acid, plasma     Status: Abnormal   Collection Time: 07/01/14  4:30 PM  Result Value Ref Range   Lactic Acid, Venous 2.1 (HH) 0.5 - 2.0 mmol/L  I-STAT 7, (LYTES, BLD GAS, ICA,  H+H)     Status: Abnormal   Collection Time: 07/01/14  5:17 PM  Result Value Ref Range   pH, Arterial 7.300 (L) 7.350 - 7.450   pCO2 arterial 43.2 35.0 - 45.0 mmHg   pO2, Arterial 127.0 (H) 80.0 - 100.0 mmHg   Bicarbonate 21.5 20.0 - 24.0 mEq/L   TCO2 23 0 - 100 mmol/L   O2 Saturation 99.0 %   Acid-base deficit 5.0 (H) 0.0 - 2.0 mmol/L   Sodium 136 135 - 145 mmol/L   Potassium 4.5 3.5 - 5.1 mmol/L   Calcium, Ion 1.07 (L) 1.12 - 1.23 mmol/L   HCT 37.0 36.0 - 46.0 %   Hemoglobin 12.6 12.0 - 15.0 g/dL   Patient temperature 16.1 C    Sample type ARTERIAL   Renal function panel (daily at 1600)     Status: Abnormal   Collection Time: 07/01/14  6:13 PM  Result Value Ref Range   Sodium 139 135 - 145 mmol/L   Potassium 4.7 3.5 - 5.1 mmol/L   Chloride 102 96 - 112 mmol/L   CO2 22 19 - 32 mmol/L   Glucose, Bld 144 (H) 70 - 99 mg/dL   BUN 54 (H) 6 - 23 mg/dL   Creatinine, Ser 0.96 (H) 0.50 - 1.10 mg/dL   Calcium 8.0 (L) 8.4 - 10.5 mg/dL   Phosphorus 9.7 (H) 2.3 - 4.6 mg/dL   Albumin 2.2 (L) 3.5 - 5.2 g/dL   GFR calc non Af Amer 8 (L) >90 mL/min   GFR calc Af Amer 10 (L) >90 mL/min   Anion gap 15 5 - 15  CBC     Status: Abnormal   Collection Time: 07/01/14  6:13 PM  Result Value Ref Range   WBC 12.0 (H) 4.0 - 10.5 K/uL   RBC 4.01 3.87 - 5.11 MIL/uL   Hemoglobin 10.6 (L) 12.0 - 15.0 g/dL   HCT 04.5 (L) 40.9 - 81.1 %   MCV 81.5 78.0 - 100.0 fL   MCH 26.4 26.0 - 34.0 pg   MCHC 32.4 30.0 - 36.0 g/dL   RDW 91.4 (H) 78.2 - 95.6 %   Platelets 108 (L) 150 - 400 K/uL  Protime-INR     Status: Abnormal   Collection Time: 07/01/14  6:13 PM  Result Value Ref Range   Prothrombin Time 21.4 (H) 11.6 - 15.2 seconds   INR 1.83 (H) 0.00 - 1.49  Triglycerides     Status: None   Collection Time: 07/01/14  6:13 PM  Result Value Ref Range   Triglycerides 49 <150 mg/dL  CBC with Differential/Platelet     Status: Abnormal   Collection Time: 07/01/14  6:13 PM  Result Value Ref Range   WBC 11.8 (H)  4.0 - 10.5 K/uL   RBC 3.99 3.87 - 5.11 MIL/uL   Hemoglobin 10.5 (L) 12.0 - 15.0 g/dL   HCT 21.3 (L) 08.6 - 57.8 %   MCV 82.5 78.0 - 100.0 fL   MCH 26.3 26.0 - 34.0 pg   MCHC 31.9 30.0 - 36.0 g/dL   RDW 46.9 (H) 62.9 - 52.8 %   Platelets 125 (  L) 150 - 400 K/uL   Neutrophils Relative % 73 43 - 77 %   Lymphocytes Relative 21 12 - 46 %   Monocytes Relative 6 3 - 12 %   Eosinophils Relative 0 0 - 5 %   Basophils Relative 0 0 - 1 %   Band Neutrophils 0 0 - 10 %   Metamyelocytes Relative 0 %   Myelocytes 0 %   Promyelocytes Absolute 0 %   Blasts 0 %   nRBC 15 (H) 0 /100 WBC   Neutro Abs 8.6 (H) 1.7 - 7.7 K/uL   Lymphs Abs 2.5 0.7 - 4.0 K/uL   Monocytes Absolute 0.7 0.1 - 1.0 K/uL   Eosinophils Absolute 0.0 0.0 - 0.7 K/uL   Basophils Absolute 0.0 0.0 - 0.1 K/uL   RBC Morphology TARGET CELLS    WBC Morphology MILD LEFT SHIFT (1-5% METAS, OCC MYELO, OCC BANDS)   Magnesium     Status: None   Collection Time: 07/01/14  6:13 PM  Result Value Ref Range   Magnesium 2.0 1.5 - 2.5 mg/dL  Phosphorus     Status: Abnormal   Collection Time: 07/01/14  6:13 PM  Result Value Ref Range   Phosphorus 9.8 (H) 2.3 - 4.6 mg/dL  Comprehensive metabolic panel     Status: Abnormal   Collection Time: 07/01/14  6:13 PM  Result Value Ref Range   Sodium 140 135 - 145 mmol/L   Potassium 4.7 3.5 - 5.1 mmol/L   Chloride 102 96 - 112 mmol/L   CO2 22 19 - 32 mmol/L   Glucose, Bld 145 (H) 70 - 99 mg/dL   BUN 55 (H) 6 - 23 mg/dL   Creatinine, Ser 1.61 (H) 0.50 - 1.10 mg/dL   Calcium 8.0 (L) 8.4 - 10.5 mg/dL   Total Protein 4.5 (L) 6.0 - 8.3 g/dL   Albumin 2.3 (L) 3.5 - 5.2 g/dL   AST 0960 (H) 0 - 37 U/L   ALT 414 (H) 0 - 35 U/L   Alkaline Phosphatase 59 39 - 117 U/L   Total Bilirubin 2.2 (H) 0.3 - 1.2 mg/dL   GFR calc non Af Amer 8 (L) >90 mL/min   GFR calc Af Amer 10 (L) >90 mL/min   Anion gap 16 (H) 5 - 15  I-STAT 3, arterial blood gas (G3+)     Status: Abnormal   Collection Time: 07/01/14  7:02 PM   Result Value Ref Range   pH, Arterial 7.251 (L) 7.350 - 7.450   pCO2 arterial 60.1 (HH) 35.0 - 45.0 mmHg   pO2, Arterial 198.0 (H) 80.0 - 100.0 mmHg   Bicarbonate 26.4 (H) 20.0 - 24.0 mEq/L   TCO2 28 0 - 100 mmol/L   O2 Saturation 100.0 %   Acid-base deficit 2.0 0.0 - 2.0 mmol/L   Patient temperature 98.6 F    Collection site FEMORAL ARTERY    Drawn by VENIPUNCTURE    Sample type ARTERIAL    Comment NOTIFIED PHYSICIAN   I-STAT 3, arterial blood gas (G3+)     Status: Abnormal   Collection Time: 07/01/14  9:30 PM  Result Value Ref Range   pH, Arterial 7.300 (L) 7.350 - 7.450   pCO2 arterial 50.2 (H) 35.0 - 45.0 mmHg   pO2, Arterial 93.0 80.0 - 100.0 mmHg   Bicarbonate 24.7 (H) 20.0 - 24.0 mEq/L   TCO2 26 0 - 100 mmol/L   O2 Saturation 96.0 %   Acid-base deficit 2.0 0.0 - 2.0  mmol/L   Sample type ARTERIAL   Procalcitonin - Baseline     Status: None   Collection Time: 07/01/14 10:10 PM  Result Value Ref Range   Procalcitonin 126.89 ng/mL     Studies/Results: Ct Abdomen Pelvis Wo Contrast  07/01/2014   CLINICAL DATA:  Renal failure and hypertension.  Respiratory failure  EXAM: CT CHEST, ABDOMEN AND PELVIS WITHOUT CONTRAST  TECHNIQUE: Multidetector CT imaging of the chest, abdomen and pelvis was performed following the standard protocol without IV contrast.  COMPARISON:  None.  FINDINGS: CT CHEST FINDINGS  THORACIC INLET/BODY WALL:  Right IJ central line is in good position.  MEDIASTINUM:  Cardiomegaly which is mild. No pericardial effusion. Diffuse atherosclerosis, including the coronary arteries. No evidence of acute vascular abnormality.  LUNG WINDOWS:  Complete opacification of the left lower lobe with volume loss. Trace left pleural effusion. Patchy nodular densities in the posterior mid lungs is are likely atelectatic or inflammatory.  OSSEOUS:  No acute fracture.  No suspicious lytic or blastic lesions.  CT ABDOMEN AND PELVIS FINDINGS  BODY WALL: No contributory findings.  Liver:  Flattened appearance of the liver margins of uncertain significance. Given the fluid level, a subcapsular collection is not suspected.  Biliary: No evidence of biliary obstruction or stone.  Pancreas: Unremarkable.  Spleen: Unremarkable.  Adrenals: Unremarkable.  Kidneys and ureters: Symmetric perinephric edema. There is bilateral smooth renal atrophy. An 18 mm presumed cyst is present in the interpolar left kidney. No hydronephrosis.  Bladder: Unremarkable.  Reproductive: Calcified fibroid in the posterior uterus measuring approximately 2 cm.  Bowel: There is probable pneumatosis of pelvic small bowel loops, the presumed source of large pneumoperitoneum. There is also moderate ascites throughout the abdomen, with bubbles of non floating gas in the rectovaginal recess, likely feculent material. No obstruction above the pneumatosis. No portal venous gas.  Vascular: 29 mm infrarenal fusiform aortic aneurysm. No visible vascular findings to explain the above.  OSSEOUS: No acute abnormalities.  Critical Value/emergent results were called by telephone at the time of interpretation on 07/01/2014 at 1:08 pm to Dr. Danford BadKATHRYN WHITEHEART , who verbally acknowledged these results.  IMPRESSION: 1. Perforated bowel with large pneumoperitoneum. Ileal pneumatosis suggests small bowel ischemia/necrosis. In this patient with recent embolic stroke, suspect mesenteric embolus. 2. Moderate ascites with feculent material or developing abscess in the rectovaginal recess. 3. Left lower lobe collapse. 4. 29 mm infrarenal aortic aneurysm.   Electronically Signed   By: Marnee SpringJonathon  Watts M.D.   On: 07/01/2014 13:17   Ct Chest Wo Contrast  07/01/2014   CLINICAL DATA:  Renal failure and hypertension.  Respiratory failure  EXAM: CT CHEST, ABDOMEN AND PELVIS WITHOUT CONTRAST  TECHNIQUE: Multidetector CT imaging of the chest, abdomen and pelvis was performed following the standard protocol without IV contrast.  COMPARISON:  None.  FINDINGS: CT CHEST  FINDINGS  THORACIC INLET/BODY WALL:  Right IJ central line is in good position.  MEDIASTINUM:  Cardiomegaly which is mild. No pericardial effusion. Diffuse atherosclerosis, including the coronary arteries. No evidence of acute vascular abnormality.  LUNG WINDOWS:  Complete opacification of the left lower lobe with volume loss. Trace left pleural effusion. Patchy nodular densities in the posterior mid lungs is are likely atelectatic or inflammatory.  OSSEOUS:  No acute fracture.  No suspicious lytic or blastic lesions.  CT ABDOMEN AND PELVIS FINDINGS  BODY WALL: No contributory findings.  Liver: Flattened appearance of the liver margins of uncertain significance. Given the fluid level, a subcapsular collection is  not suspected.  Biliary: No evidence of biliary obstruction or stone.  Pancreas: Unremarkable.  Spleen: Unremarkable.  Adrenals: Unremarkable.  Kidneys and ureters: Symmetric perinephric edema. There is bilateral smooth renal atrophy. An 18 mm presumed cyst is present in the interpolar left kidney. No hydronephrosis.  Bladder: Unremarkable.  Reproductive: Calcified fibroid in the posterior uterus measuring approximately 2 cm.  Bowel: There is probable pneumatosis of pelvic small bowel loops, the presumed source of large pneumoperitoneum. There is also moderate ascites throughout the abdomen, with bubbles of non floating gas in the rectovaginal recess, likely feculent material. No obstruction above the pneumatosis. No portal venous gas.  Vascular: 29 mm infrarenal fusiform aortic aneurysm. No visible vascular findings to explain the above.  OSSEOUS: No acute abnormalities.  Critical Value/emergent results were called by telephone at the time of interpretation on 07/01/2014 at 1:08 pm to Dr. Danford Bad , who verbally acknowledged these results.  IMPRESSION: 1. Perforated bowel with large pneumoperitoneum. Ileal pneumatosis suggests small bowel ischemia/necrosis. In this patient with recent embolic  stroke, suspect mesenteric embolus. 2. Moderate ascites with feculent material or developing abscess in the rectovaginal recess. 3. Left lower lobe collapse. 4. 29 mm infrarenal aortic aneurysm.   Electronically Signed   By: Marnee Spring M.D.   On: 07/01/2014 13:17   Dg Chest Port 1 View  07/01/2014   CLINICAL DATA:  Status post central line placement;History of asthma, chronic renal insufficiency, and gout  EXAM: PORTABLE CHEST - 1 VIEW  COMPARISON:  Portable chest x-ray of July 01, 2014 at 7:29 a.m.  FINDINGS: The right lung is adequately inflated and clear. On the left there is progressive volume loss with shift of the mediastinum toward the left. There is dense infiltrate and/or pleural effusion in the left lower hemi thorax. The left heart border is obscured. The central pulmonary vascularity is prominent. The dialysis catheter on the right is unchanged in position with the tip in the proximal SVC. A newly placed left internal jugular venous catheter tip projects at the junction of the right and left brachiocephalic veins.  IMPRESSION: There is no postprocedure complication following placement of a left internal jugular venous catheter the dialysis catheter is unchanged. There is persistent left lower lobe atelectasis and probable pleural effusion.   Electronically Signed   By: David  [place]   On: 07/01/2014 15:27   Dg Chest Port 1 View  07/01/2014   CLINICAL DATA:  Dyspnea.  EXAM: PORTABLE CHEST - 1 VIEW  COMPARISON:  06/29/2014  FINDINGS: Right jugular dual-lumen catheter in the SVC unchanged. No pneumothorax.  Left lower lobe consolidation with volume loss unchanged. This is most consistent with collapse in the left lower lobe. Possible small left effusion. Right lower lobe atelectasis unchanged. Negative for edema.  IMPRESSION: Left lower lobe collapse remains unchanged. Mild right lower lobe atelectasis. Negative for edema.   Electronically Signed   By: Marlan Palau M.D.   On: 07/01/2014 07:48     . antiseptic oral rinse  7 mL Mouth Rinse BID  . chlorhexidine  15 mL Mouth Rinse BID  . dextrose  1 ampule Intravenous Once  . enoxaparin (LOVENOX) injection  30 mg Subcutaneous Q24H  . ferric gluconate (FERRLECIT/NULECIT) IV  125 mg Intravenous Q M,W,F-HD  . fluconazole (DIFLUCAN) IV  400 mg Intravenous Q24H  . hydrocortisone sod succinate (SOLU-CORTEF) inj  50 mg Intravenous Q6H  . insulin regular  10 Units Subcutaneous Once  . ipratropium  0.5 mg Nebulization Q6H  .  levalbuterol  0.63 mg Nebulization Q6H  . pantoprazole (PROTONIX) IV  40 mg Intravenous Q24H  . piperacillin-tazobactam  3.375 g Intravenous 4 times per day  . vancomycin  1,000 mg Intravenous Q24H     Assessment/Plan: s/p Procedure(s): EXPLORATORY LAPAROTOMY WITH CLOSURE OF PERFORATED PYLORIC ULCER SMALL BOWEL RESECTION  POD #1. Closure perforated pyloric ulcer. Small bowel resection for ischemic necrosis. Diffuse peritonitis Continue triple antibiotics Nothing by mouth Begin TPN Daily wound care Protonix BID  Steroids. High-dose steroids probably RA contributive factor to her perforated ulcer, will have a direct adverse effect on wound healing and anastomotic healing. I would ask CCM to address the need for the dose and wean as rapidly as possible if feasible.  ESRD-CVVH COPD Recent CVA Acute encephalopathy.    @PROBHOSP @  LOS: 15 days    Kavi Almquist M 07/02/2014  . .prob

## 2014-07-02 NOTE — Progress Notes (Signed)
[facility] Progress Note  Assessment/Plan: 1. Perforated pyloric ulcer and ischemic bowel-s/p surgery on 3/30- now on vanc and zosyn.  Ischemic bowel could have been embolic this admit.  No heparin with CRRT   2. CVA/cryptogenic stroke - acute ACA, left hemiparesis; neuro following - 3. ESRD - new start this admission (sister was [facility], prev [facility] HD at [facility])- problems with AVF 3/25 - had fistulagram 3/21 without intervention - showed focal aneurysmal dilitation and competing venous outflow in the deep venous system -VVS wanting to wait for her to recover before revision of AVF is considered -  - right IJ placed 3/26 - - catheter poorly functional  Initially but now better-using it now for CRRT given hemodynamic instability.  Keep on 0 K for dialysate right now til see PM labs 4. Anemia - Hgb previously good- CKD plus surgery- follow 5. Secondary hyperparathyroidism - iPTH 338 - 3/21, 531 3/18 - started hectorol 1- also on phoslo but not eating 6. HTN/volume -  Now on pressors.  Did daily HD for 2 days prior to surgery so feel like is close to euvolemic- running even 7. Nutrition - alb 2.6 + vit- surgery will not help that  8. COPD/tobacco - encouraged smoking cessation. 9. S/p tx for Infuenza [name]/HIN1 10. Severe R HF per TEE  11. Renal mass per [place] 3/16 - "Indeterminate 1.9 cm hypoechoic mass off the interpolar region of the left kidney. This may potentially represent [name] cyst however solid mass is not excluded. Recommend follow-up in the outpatient setting with eitherpre and post enhanced CT or MRI." 12. DM - per primary BS 70 - 120 14. Increased WBC- pulm possibly vs steroids- is on big dose ? Talked with primary rapid steroid taper 15.  Dispo- certainly seems like patient has not thrived well with this whole thing. Will need to see how she recovers from this latest set back   [name]   07/02/2014, 8:04 AM    Subjective:  Events noted- went to OR found  perforated pyloric ulcer in addition to some ischemic bowel- now in ICU on vent/pressors  Objective Filed Vitals:   07/02/14 0700 07/02/14 0730 07/02/14 0743 07/02/14 0745  BP:      Pulse: 93 86 91 88  Temp: 96.8 F (36 C) 97.2 F (36.2 C)  97.3 F (36.3 C)  TempSrc:      Resp:      Height:      Weight:      SpO2: 100% 99% 98% 99%   Physical Exam General: sedated, intubated Heart: RRR Lungs: coarse BS clear some with cough Abdomen: soft + BS, epigastric tenderness -no rebound/guarding Extremities:1+ LE edema Dialysis Access: new right IJ and left upper AVF + bruit - bruising  Dialysis Orders: none - new start to HD  Additional Objective Labs: Basic Metabolic Panel:  Recent Labs Lab 06/27/14 0249  07/01/14 1445 07/01/14 1532  07/01/14 1717 07/01/14 1813 07/02/14 0430  NA 137  < > 138 136  < > 136 140  139 137  K 5.0  < > 5.8* 5.7*  < > 4.5 4.7  4.7 4.5  CL 101  < > 96 96  --   --  102  102 103  CO2 27  < > 24  --   --   --  GLUCOSE 196*  < > 134* 133*  --   --  145*  144* 128*  BUN 73*  < >  54* 60*  --   --  55*  54* 34*  CREATININE 4.32*  < > 5.42* 5.70*  --   --  5.17*  5.13* 3.33*  CALCIUM 8.2*  < > 9.1  --   --   --  8.0*  8.0* 8.1*  PHOS 5.6*  --   --   --   --   --  9.8*  9.7* 5.8*  < > = values in this interval not displayed. Liver Function Tests:  Recent Labs Lab 07/01/14 0318 07/01/14 1813 07/02/14 0430  AST 91* 1352* 757*  ALT 64* 414* 455*  ALKPHOS 105 59 65  BILITOT 2.4* 2.2* 2.8*  PROT 6.7 4.5* 5.2*  ALBUMIN 2.5* 2.3*  2.2* 2.4*   CBC:  Recent Labs Lab 06/30/14 0358  07/01/14 0318 07/01/14 1445  07/01/14 1717 07/01/14 1813 07/02/14 0430  WBC 32.4*  --  19.2* 20.2*  --   --  11.8*  12.0* 13.6*  NEUTROABS  --   --   --  17.8*  --   --  8.6* 11.4*  HGB 12.4  < > 14.9 13.4  < > 12.6 10.5*  10.6* 11.1*  HCT 37.9  < > 44.7 41.7  < > 37.0 32.9*  32.7* 35.1*  MCV 82.2  --  81.3 82.6  --   --  82.5  81.5 81.3   PLT 210  --  189 154  --   --  125*  108* 116*  < > = values in this interval not displayed. CBG:  Recent Labs Lab 06/28/14 2226 07/01/14 1326  GLUCAP 93 100*  Medications: . sodium chloride 10 mL/hr at 07/02/14 0252  . sodium chloride 125 mL/hr at 07/02/14 0252  . norepinephrine (LEVOPHED) Adult infusion 15 mcg/min (07/02/14 0742)  . dialysate (PRISMASATE) 1,500 mL/hr at 07/02/14 0533  . dialysis replacement fluid (prismasate) 350 mL/hr at 07/01/14 1903  . dialysis replacement fluid (prismasate) 350 mL/hr at 07/01/14 1904  . propofol 10 mcg/kg/min (07/01/14 1930)   . antiseptic oral rinse  7 mL Mouth Rinse BID  . chlorhexidine  15 mL Mouth Rinse BID  . dextrose  1 ampule Intravenous Once  . enoxaparin (LOVENOX) injection  30 mg Subcutaneous Q24H  . ferric gluconate (FERRLECIT/NULECIT) IV  125 mg Intravenous Q M,W,F-HD  . fluconazole (DIFLUCAN) IV  400 mg Intravenous Q24H  . hydrocortisone sod succinate (SOLU-CORTEF) inj  50 mg Intravenous Q6H  . insulin regular  10 Units Subcutaneous Once  . ipratropium  0.5 mg Nebulization Q6H  . levalbuterol  0.63 mg Nebulization Q6H  . pantoprazole (PROTONIX) IV  40 mg Intravenous Q12H  . piperacillin-tazobactam  3.375 g Intravenous 4 times per day  . vancomycin  1,000 mg Intravenous Q24H

## 2014-07-02 NOTE — Progress Notes (Signed)
UR Completed.  [phone]  

## 2014-07-02 NOTE — Progress Notes (Signed)
Esophageal probe placed per Elink MD order. Nurse unable to measure pts temp via oral, axillary or rectally. Core temp currently 35.2C. Will  Continue to monitor pt.

## 2014-07-02 NOTE — Consult Note (Signed)
Name: [name] MRN: [id number] DOB: [birth year]    ADMISSION DATE:  06/17/2014 CONSULTATION DATE:  3/30  REFERRING MD :  Mahala Menghini   CHIEF COMPLAINT:  Hypoxia   BRIEF PATIENT DESCRIPTION: 59 yo female smoker with hx HTN, ESRD on HD (just began this admit), COPD, failed L AV fistula, hx COPD, initially admitted 3/16 with flu, acute hypoxic resp failure.  Ultimately tx to [facility] for worsening metabolic parameters and need for HD.  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  Has made little improvement and on 3/30 has worsening hypoxia, generalized pain, hyperkalemia, worsening leukocytosis and PCCM consulted.     SIGNIFICANT EVENTS    STUDIES:  3/22 TEE>>> severe RAE, severely reduced RV function, severe TR, no LAA thrombus   SUBJECTIVE: To OR overnight for exp lap for pyloric ulcer rupture  VITAL SIGNS: Temp:  [94.6 F (34.8 C)-98.2 F (36.8 C)] 98.2 F (36.8 C) (03/31 1300) Pulse Rate:  [73-93] 81 (03/31 1300) Resp:  [17-29] 20 (03/31 1217) BP: (108-137)/(51-105) 108/51 mmHg (03/30 1753) SpO2:  [98 %-100 %] 100 % (03/31 1300) Arterial Line BP: (88-142)/(54-83) 115/63 mmHg (03/31 1300) FiO2 (%):  [60 %-100 %] 60 % (03/31 1217) Weight:  [96.2 kg (212 lb 1.3 oz)] 96.2 kg (212 lb 1.3 oz) (03/31 0215)  PHYSICAL EXAMINATION: General:  Chronically ill appearing female, sedated on vent Neuro:  Awake, answers questions, follows commands, moaning at times  HEENT:  Mm dry, venti mask, no JVD  Cardiovascular:  s1s2 rrr Lungs:  resps even non labored on venti mask, diminished bases L>R, few scattered rhonchi Abdomen:  Round, mildly distended, tender, hypoactive bs  Musculoskeletal:  Warm and dry, no sig edema    Recent Labs Lab 07/01/14 1445 07/01/14 1532  07/01/14 1717 07/01/14 1813 07/02/14 0430  NA 138 136  < > 136 140  139 137  K 5.8* 5.7*  < > 4.5 4.7  4.7 4.5  CL 96 96  --   --  102  102 103  CO2 24  --   --   --  BUN 54* 60*   --   --  55*  54* 34*  CREATININE 5.42* 5.70*  --   --  5.17*  5.13* 3.33*  GLUCOSE 134* 133*  --   --  145*  144* 128*  < > = values in this interval not displayed.  Recent Labs Lab 07/01/14 1445  07/01/14 1717 07/01/14 1813 07/02/14 0430  HGB 13.4  < > 12.6 10.5*  10.6* 11.1*  HCT 41.7  < > 37.0 32.9*  32.7* 35.1*  WBC 20.2*  --   --  11.8*  12.0* 13.6*  PLT 154  --   --  125*  108* 116*  < > = values in this interval not displayed. Ct Abdomen Pelvis Wo Contrast  07/01/2014   CLINICAL DATA:  Renal failure and hypertension.  Respiratory failure  EXAM: CT CHEST, ABDOMEN AND PELVIS WITHOUT CONTRAST  TECHNIQUE: Multidetector CT imaging of the chest, abdomen and pelvis was performed following the standard protocol without IV contrast.  COMPARISON:  None.  FINDINGS: CT CHEST FINDINGS  THORACIC INLET/BODY WALL:  Right IJ central line is in good position.  MEDIASTINUM:  Cardiomegaly which is mild. No pericardial effusion. Diffuse atherosclerosis, including the coronary arteries. No evidence of acute vascular abnormality.  LUNG WINDOWS:  Complete opacification of the left lower lobe with volume loss.  Trace left pleural effusion. Patchy nodular densities in the posterior mid lungs is are likely atelectatic or inflammatory.  OSSEOUS:  No acute fracture.  No suspicious lytic or blastic lesions.  CT ABDOMEN AND PELVIS FINDINGS  BODY WALL: No contributory findings.  Liver: Flattened appearance of the liver margins of uncertain significance. Given the fluid level, a subcapsular collection is not suspected.  Biliary: No evidence of biliary obstruction or stone.  Pancreas: Unremarkable.  Spleen: Unremarkable.  Adrenals: Unremarkable.  Kidneys and ureters: Symmetric perinephric edema. There is bilateral smooth renal atrophy. An 18 mm presumed cyst is present in the interpolar left kidney. No hydronephrosis.  Bladder: Unremarkable.  Reproductive: Calcified fibroid in the posterior uterus measuring  approximately 2 cm.  Bowel: There is probable pneumatosis of pelvic small bowel loops, the presumed source of large pneumoperitoneum. There is also moderate ascites throughout the abdomen, with bubbles of non floating gas in the rectovaginal recess, likely feculent material. No obstruction above the pneumatosis. No portal venous gas.  Vascular: 29 mm infrarenal fusiform aortic aneurysm. No visible vascular findings to explain the above.  OSSEOUS: No acute abnormalities.  Critical Value/emergent results were called by telephone at the time of interpretation on 07/01/2014 at 1:08 pm to Dr. Danford Bad , who verbally acknowledged these results.  IMPRESSION: 1. Perforated bowel with large pneumoperitoneum. Ileal pneumatosis suggests small bowel ischemia/necrosis. In this patient with recent embolic stroke, suspect mesenteric embolus. 2. Moderate ascites with feculent material or developing abscess in the rectovaginal recess. 3. Left lower lobe collapse. 4. 29 mm infrarenal aortic aneurysm.   Electronically Signed   By: Marnee Spring M.D.   On: 07/01/2014 13:17   Ct Chest Wo Contrast  07/01/2014   CLINICAL DATA:  Renal failure and hypertension.  Respiratory failure  EXAM: CT CHEST, ABDOMEN AND PELVIS WITHOUT CONTRAST  TECHNIQUE: Multidetector CT imaging of the chest, abdomen and pelvis was performed following the standard protocol without IV contrast.  COMPARISON:  None.  FINDINGS: CT CHEST FINDINGS  THORACIC INLET/BODY WALL:  Right IJ central line is in good position.  MEDIASTINUM:  Cardiomegaly which is mild. No pericardial effusion. Diffuse atherosclerosis, including the coronary arteries. No evidence of acute vascular abnormality.  LUNG WINDOWS:  Complete opacification of the left lower lobe with volume loss. Trace left pleural effusion. Patchy nodular densities in the posterior mid lungs is are likely atelectatic or inflammatory.  OSSEOUS:  No acute fracture.  No suspicious lytic or blastic lesions.  CT  ABDOMEN AND PELVIS FINDINGS  BODY WALL: No contributory findings.  Liver: Flattened appearance of the liver margins of uncertain significance. Given the fluid level, a subcapsular collection is not suspected.  Biliary: No evidence of biliary obstruction or stone.  Pancreas: Unremarkable.  Spleen: Unremarkable.  Adrenals: Unremarkable.  Kidneys and ureters: Symmetric perinephric edema. There is bilateral smooth renal atrophy. An 18 mm presumed cyst is present in the interpolar left kidney. No hydronephrosis.  Bladder: Unremarkable.  Reproductive: Calcified fibroid in the posterior uterus measuring approximately 2 cm.  Bowel: There is probable pneumatosis of pelvic small bowel loops, the presumed source of large pneumoperitoneum. There is also moderate ascites throughout the abdomen, with bubbles of non floating gas in the rectovaginal recess, likely feculent material. No obstruction above the pneumatosis. No portal venous gas.  Vascular: 29 mm infrarenal fusiform aortic aneurysm. No visible vascular findings to explain the above.  OSSEOUS: No acute abnormalities.  Critical Value/emergent results were called by telephone at the time of interpretation on 07/01/2014  at 1:08 pm to Dr. Danford Bad , who verbally acknowledged these results.  IMPRESSION: 1. Perforated bowel with large pneumoperitoneum. Ileal pneumatosis suggests small bowel ischemia/necrosis. In this patient with recent embolic stroke, suspect mesenteric embolus. 2. Moderate ascites with feculent material or developing abscess in the rectovaginal recess. 3. Left lower lobe collapse. 4. 29 mm infrarenal aortic aneurysm.   Electronically Signed   By: Marnee Spring M.D.   On: 07/01/2014 13:17   Dg Chest Portable 1 View  07/02/2014   CLINICAL DATA:  Atelectasis.  EXAM: PORTABLE CHEST - 1 VIEW  COMPARISON:  None.  FINDINGS: Interim intubation with endotracheal tube tip 5.2 cm above the carina. Interim placement of NG tube, its tip is projected below  left hemidiaphragm. Right dialysis catheter, left IJ catheter in stable position.Stable cardiomegaly. No pulmonary venous congestion. Persistent left lower lobe atelectasis and infiltrate with possible small left pleural effusion. No pneumothorax.  IMPRESSION: 1. Interim intubation. Its tip is 5.2 cm above the carina. Interim NG tube placement. Its tip is below the left hemidiaphragm. 2. Persistent left lower lobe atelectasis and infiltrate. Small left pleural effusion cannot be excluded. 3. Stable cardiomegaly.   Electronically Signed   By: Maisie Fus  Register   On: 07/02/2014 07:14   Dg Chest Port 1 View  07/01/2014   CLINICAL DATA:  Status post central line placement;History of asthma, chronic renal insufficiency, and gout  EXAM: PORTABLE CHEST - 1 VIEW  COMPARISON:  Portable chest x-ray of July 01, 2014 at 7:29 a.m.  FINDINGS: The right lung is adequately inflated and clear. On the left there is progressive volume loss with shift of the mediastinum toward the left. There is dense infiltrate and/or pleural effusion in the left lower hemi thorax. The left heart border is obscured. The central pulmonary vascularity is prominent. The dialysis catheter on the right is unchanged in position with the tip in the proximal SVC. A newly placed left internal jugular venous catheter tip projects at the junction of the right and left brachiocephalic veins.  IMPRESSION: There is no postprocedure complication following placement of a left internal jugular venous catheter the dialysis catheter is unchanged. There is persistent left lower lobe atelectasis and probable pleural effusion.   Electronically Signed   By: David  [place]   On: 07/01/2014 15:27   Dg Chest Port 1 View  07/01/2014   CLINICAL DATA:  Dyspnea.  EXAM: PORTABLE CHEST - 1 VIEW  COMPARISON:  06/29/2014  FINDINGS: Right jugular dual-lumen catheter in the SVC unchanged. No pneumothorax.  Left lower lobe consolidation with volume loss unchanged. This is most  consistent with collapse in the left lower lobe. Possible small left effusion. Right lower lobe atelectasis unchanged. Negative for edema.  IMPRESSION: Left lower lobe collapse remains unchanged. Mild right lower lobe atelectasis. Negative for edema.   Electronically Signed   By: Marlan Palau M.D.   On: 07/01/2014 07:48    ASSESSMENT / PLAN:  Acute hypoxemic respiratory failure - multifactorial initially r/t H1N1, volume overload in setting ESRD.   COPD  LLL Atelectasis/ collapse ?HCAP  ?OSA Post op respiratory failure - Maintain on full vent support. - Titrate O2 for sat of 88-92%. - Pressure support trials today. - SBT in AM.  CARDIAC Chronic dCHF - EF 60-65%, grade 1 diastolic dysfunction PSVT resolved. Now in septic shock. Plan -  - Levophed for MAP of 65 mmHg. - Hold anti-HTN - See ID section.  GI - CDiff neg, denies n/v/d. LFT's ok, s/p  exp lap for pyloric ulcer. Plan -  - Per surgery. - NPO. - Protonix. - TPN.  Renal: Hyperkalemia  ESRD Failed AV fistula - using temp HD cath  Plan -  - CVVH even. - BMET in AM. - Replace electrolytes as indicated. Margaretha Glassing- KVO IVF  Neuro Acute stroke Plan -  - Minimize sedation. - D/C propofol. - Target RASS of 0. - Fentanyl for pain control.  No family bedside.  Begin PS trials, continue pressors and CVVH at even.  Will SBT in AM.  The patient is critically ill with multiple organ systems failure and requires high complexity decision making for assessment and support, frequent evaluation and titration of therapies, application of advanced monitoring technologies and extensive interpretation of multiple databases.   Critical Care Time devoted to patient care services described in this note is  35  Minutes. This time reflects time of care of this signee Dr Koren BoundWesam Yacoub. This critical care time does not reflect procedure time, or teaching time or supervisory time of PA/NP/Med student/Med Resident etc but could involve care  discussion time.  [name], M.D. [facility]. Pager: [phone]. After hours pager: [phone].

## 2014-07-02 NOTE — Progress Notes (Signed)
Nurse tried multiple times to take pts temp. 35.6 C [facility] Order placed to start bare hugger. Will continue to monitor pt.

## 2014-07-02 NOTE — Progress Notes (Signed)
PARENTERAL NUTRITION CONSULT NOTE - INITIAL  Pharmacy Consult for TPN Indication: prolonged ileus  No Known Allergies  Patient Measurements: Height:  (165.1 cm) Weight: 212 lb 1.3 oz (96.2 kg) IBW/kg (Calculated) : 57 Adjusted Body We100 kg  Vital Signs: Temp: 97.7 F (36.5 C) (03/31 1000) Temp Source: Core (Comment) (03/31 0800) Pulse Rate: 83 (03/31 1000) Intake/Output from previous day: 03/30 0701 - 03/31 0700 In: 3271.5 [I.V.:2181.5; NG/GT:40; IV Piggyback:1050] Out: 1994 [Urine:50; Drains:80] Intake/Output from this shift: Total I/O In: 419 [I.V.:419] Out: 404 [Urine:10; Drains:45; Other:349]  Labs:  Recent Labs  07/01/14 1445  07/01/14 1717 07/01/14 1813 07/02/14 0430  WBC 20.2*  --   --  11.8*  12.0* 13.6*  HGB 13.4  < > 12.6 10.5*  10.6* 11.1*  HCT 41.7  < > 37.0 32.9*  32.7* 35.1*  PLT 154  --   --  125*  108* 116*  APTT  --   --   --   --  35  INR 1.44  --   --  1.83* 1.51*  < > = values in this interval not displayed.   Recent Labs  07/01/14 0318 07/01/14 1445 07/01/14 1532  07/01/14 1717 07/01/14 1813 07/02/14 0430  NA 140 138 136  < > 136 140  139 137  K 5.8* 5.8* 5.7*  < > 4.5 4.7  4.7 4.5  CL 98 96 96  --   --  102  102 103  CO2 27 24  --   --   --  GLUCOSE 92 134* 133*  --   --  145*  144* 128*  BUN 36* 54* 60*  --   --  55*  54* 34*  CREATININE 4.41* 5.42* 5.70*  --   --  5.17*  5.13* 3.33*  CALCIUM 8.9 9.1  --   --   --  8.0*  8.0* 8.1*  MG  --   --   --   --   --  2.0 2.1  PHOS  --   --   --   --   --  9.8*  9.7* 5.8*  PROT 6.7  --   --   --   --  4.5* 5.2*  ALBUMIN 2.5*  --   --   --   --  2.3*  2.2* 2.4*  AST 91*  --   --   --   --  1352* 757*  ALT 64*  --   --   --   --  414* 455*  ALKPHOS 105  --   --   --   --  59 65  BILITOT 2.4*  --   --   --   --  2.2* 2.8*  TRIG  --   --   --   --   --  49  --   < > = values in this interval not displayed. Estimated Creatinine Clearance: 21.1 mL/min (by C-G  formula based on Cr of 3.33).    Recent Labs  07/01/14 1326  GLUCAP 100*    Medical History: Past Medical History  Diagnosis Date  . Asthma   . Hypertension   . Gout   . Arthritis   . Insomnia   . Chronic kidney disease   . Depression     Medications:  Propofol, IV solucortef, IV PPI, levophed,   Insulin Requirements in the past 24 hours:  Not on SSI, on solucortef 50  IV q6h  Current Nutrition:  NPO Got 12 hours of propofol 3/30 8pm to 8am 3/31 - off for WUA, RN waiting for CCM to round to see if propofol to continue  Assessment: [age] yo F with complicated hospital course. POD#1 closure perforated pyloric ulcer, SBR for ischemic necrosis, diffuse peritonitis. New stroke noted on 3/18 .  ESRD, new start HD this admission, currently on CRRT, intubated and sedated. Pharmacy consulted to provide TPN for nutrition support.  GI: POD#1 closure perforated pyloric ulcer, SBR for ischemic necrosis, diffuse peritonitis Renal: ESRD, new start HD this admission, on CRRT Lytes: WNL x phos 5.8, will NOT put lytes in TPN Endocrine: DM, on solucortef 50 q6h, will add empiric SSI Hepatobil: Trig 294>49, AST/ALT 1353>757 and 414>455, T bili elevated at 2.8, will add standard trace elements for now  Nutritional Goals: per RD 3/31 1400 kCal, 120-140 grams of protein per day  Plan:  -start Clinimix 5/15 (NO electrolytes) at 40 ml/hr with NO IVFE for first 7 days.  This provides 48 gm protein and 682 kcal -empiric SSI -TPN labs ordered -standard MVI and trace elements.   Stephanie AbrahamMichelle T. Jakaylah [name], Pharm.D. [phone] 07/02/2014 11:31 AM

## 2014-07-02 NOTE — Progress Notes (Signed)
Subjective:  Intubated and sedated. POD # 1 exploratory lap, perforated pyloric ulcer, resection 73f 14" ileum  Objective:  Temp:  [94.6 F (34.8 C)-97.7 F (36.5 C)] 97.7 F (36.5 C) (03/31 1000) Pulse Rate:  [73-93] 83 (03/31 1000) Resp:  [17-29] 20 (03/31 0839) BP: (108-137)/(51-105) 108/51 mmHg (03/30 1753) SpO2:  [98 %-100 %] 100 % (03/31 1000) Arterial Line BP: (88-142)/(54-83) 104/58 mmHg (03/31 1000) FiO2 (%):  [60 %-100 %] 60 % (03/31 0839) Weight:  [212 lb 1.3 oz (96.2 kg)] 212 lb 1.3 oz (96.2 kg) (03/31 0215) Weight change: -10 lb 12.8 oz (-4.9 kg)  Intake/Output from previous day: 03/30 0701 - 03/31 0700 In: 3271.5 [I.V.:2181.5; NG/GT:40; IV Piggyback:1050] Out: 1994 [Urine:50; Drains:80]  Intake/Output from this shift: Total I/O In: 419 [I.V.:419] Out: 404 [Urine:10; Drains:45; Other:349]  Physical Exam: General appearance: Intubated and sedated Neck: no adenopathy, no carotid bruit, no JVD, supple, symmetrical, trachea midline and thyroid not enlarged, symmetric, no tenderness/mass/nodules Lungs: clear to auscultation bilaterally Heart: regular rate and rhythm, S1, S2 normal, no murmur, click, rub or gallop Extremities: extremities normal, atraumatic, no cyanosis or edema  Lab Results: Results for orders placed or performed during the hospital encounter of 06/17/14 (from the past 48 hour(s))  I-STAT, chem 8     Status: Abnormal   Collection Time: 06/30/14 11:01 AM  Result Value Ref Range   Sodium 140 135 - 145 mmol/L   Potassium 3.6 3.5 - 5.1 mmol/L   Chloride 97 96 - 112 mmol/L   BUN 32 (H) 6 - 23 mg/dL   Creatinine, Ser 3.40 (H) 0.50 - 1.10 mg/dL   Glucose, Bld 104 (H) 70 - 99 mg/dL   Calcium, Ion 1.20 1.12 - 1.23 mmol/L   TCO2 23 0 - 100 mmol/L   Hemoglobin 16.0 (H) 12.0 - 15.0 g/dL   HCT 47.0 (H) 36.0 - 46.0 %  Potassium     Status: Abnormal   Collection Time: 06/30/14 11:15 AM  Result Value Ref Range   Potassium 3.4 (L) 3.5 - 5.1 mmol/L    CBC     Status: Abnormal   Collection Time: 07/01/14  3:18 AM  Result Value Ref Range   WBC 19.2 (H) 4.0 - 10.5 K/uL   RBC 5.50 (H) 3.87 - 5.11 MIL/uL   Hemoglobin 14.9 12.0 - 15.0 g/dL   HCT 44.7 36.0 - 46.0 %   MCV 81.3 78.0 - 100.0 fL   MCH 27.1 26.0 - 34.0 pg   MCHC 33.3 30.0 - 36.0 g/dL   RDW 19.8 (H) 11.5 - 15.5 %   Platelets 189 150 - 400 K/uL  Comprehensive metabolic panel     Status: Abnormal   Collection Time: 07/01/14  3:18 AM  Result Value Ref Range   Sodium 140 135 - 145 mmol/L   Potassium 5.8 (H) 3.5 - 5.1 mmol/L    Comment: DELTA CHECK NOTED SLIGHT HEMOLYSIS    Chloride 98 96 - 112 mmol/L   CO2 27 19 - 32 mmol/L   Glucose, Bld 92 70 - 99 mg/dL   BUN 36 (H) 6 - 23 mg/dL   Creatinine, Ser 4.41 (H) 0.50 - 1.10 mg/dL   Calcium 8.9 8.4 - 10.5 mg/dL   Total Protein 6.7 6.0 - 8.3 g/dL   Albumin 2.5 (L) 3.5 - 5.2 g/dL   AST 91 (H) 0 - 37 U/L   ALT 64 (H) 0 - 35 U/L   Alkaline Phosphatase 105 39 - 117  U/L   Total Bilirubin 2.4 (H) 0.3 - 1.2 mg/dL   GFR calc non Af Amer 10 (L) >90 mL/min   GFR calc Af Amer 12 (L) >90 mL/min    Comment: (NOTE) The eGFR has been calculated using the CKD EPI equation. This calculation has not been validated in all clinical situations. eGFR's persistently <90 mL/min signify possible Chronic Kidney Disease.    Anion gap 15 5 - 15  Blood gas, arterial     Status: Abnormal   Collection Time: 07/01/14  7:20 AM  Result Value Ref Range   FIO2 0.50 %   Delivery systems VENTURI MASK    pH, Arterial 7.366 7.350 - 7.450   pCO2 arterial 42.1 35.0 - 45.0 mmHg   pO2, Arterial 70.6 (L) 80.0 - 100.0 mmHg   Bicarbonate 23.6 20.0 - 24.0 mEq/L   TCO2 24.8 0 - 100 mmol/L   Acid-base deficit 1.0 0.0 - 2.0 mmol/L   O2 Saturation 90.5 %   Patient temperature 98.6    Collection site RIGHT RADIAL    Drawn by [id number]    Sample type ARTERIAL DRAW    Allens test (pass/fail) PASS PASS  Glucose, capillary     Status: Abnormal   Collection Time:  07/01/14  1:26 PM  Result Value Ref Range   Glucose-Capillary 100 (H) 70 - 99 mg/dL  Procalcitonin - Baseline     Status: None   Collection Time: 07/01/14  2:45 PM  Result Value Ref Range   Procalcitonin >175.00 ng/mL    Comment: NOTIFIED Renne Crigler RN 1962 07/02/2014 BY MACEDA, J (NOTE)         ICU PCT Algorithm               Non ICU PCT Algorithm    ----------------------------     ------------------------------         PCT < 0.25 ng/mL                 PCT < 0.1 ng/mL     Stopping of antibiotics            Stopping of antibiotics       strongly encouraged.               strongly encouraged.    ----------------------------     ------------------------------       PCT level decrease by               PCT < 0.25 ng/mL       >= 80% from peak PCT       OR PCT 0.25 - 0.5 ng/mL          Stopping of antibiotics                                             encouraged.     Stopping of antibiotics           encouraged.    ----------------------------     ------------------------------       PCT level decrease by              PCT >= 0.25 ng/mL       < 80% from peak PCT        AND PCT >= 0.5 ng/mL            Continuing antibiotics  encouraged.       Continu ing antibiotics            encouraged.    ----------------------------     ------------------------------     PCT level increase compared          PCT > 0.5 ng/mL         with peak PCT AND          PCT >= 0.5 ng/mL             Escalation of antibiotics                                          strongly encouraged.      Escalation of antibiotics        strongly encouraged. CORRECTED ON 03/31 AT 0856: PREVIOUSLY REPORTED AS >200.00        Interpretation: PCT >= 10 ng/mL: Important systemic inflammatory response, almost exclusively due to severe bacterial sepsis or septic shock.   Lactic acid, plasma     Status: Abnormal   Collection Time: 07/01/14  2:45 PM  Result Value Ref Range   Lactic  Acid, Venous 3.3 (HH) 0.5 - 2.0 mmol/L    Comment: REPEATED TO VERIFY CRITICAL RESULT CALLED TO, READ BACK BY AND VERIFIED WITH: L EDWARDS,RN 1646 07/01/14 WBOND   Basic metabolic panel     Status: Abnormal   Collection Time: 07/01/14  2:45 PM  Result Value Ref Range   Sodium 138 135 - 145 mmol/L   Potassium 5.8 (H) 3.5 - 5.1 mmol/L   Chloride 96 96 - 112 mmol/L   CO2 24 19 - 32 mmol/L   Glucose, Bld 134 (H) 70 - 99 mg/dL   BUN 54 (H) 6 - 23 mg/dL   Creatinine, Ser 7.57 (H) 0.50 - 1.10 mg/dL   Calcium 9.1 8.4 - 01.6 mg/dL   GFR calc non Af Amer 8 (L) >90 mL/min   GFR calc Af Amer 9 (L) >90 mL/min    Comment: (NOTE) The eGFR has been calculated using the CKD EPI equation. This calculation has not been validated in all clinical situations. eGFR's persistently <90 mL/min signify possible Chronic Kidney Disease.    Anion gap 18 (H) 5 - 15  CBC with Differential     Status: Abnormal   Collection Time: 07/01/14  2:45 PM  Result Value Ref Range   WBC 20.2 (H) 4.0 - 10.5 K/uL   RBC 5.05 3.87 - 5.11 MIL/uL   Hemoglobin 13.4 12.0 - 15.0 g/dL   HCT 98.4 01.2 - 94.5 %   MCV 82.6 78.0 - 100.0 fL   MCH 26.5 26.0 - 34.0 pg   MCHC 32.1 30.0 - 36.0 g/dL   RDW 87.0 (H) 17.0 - 96.5 %   Platelets 154 150 - 400 K/uL   Neutrophils Relative % 88 (H) 43 - 77 %   Lymphocytes Relative 8 (L) 12 - 46 %   Monocytes Relative 4 3 - 12 %   Eosinophils Relative 0 0 - 5 %   Basophils Relative 0 0 - 1 %   Band Neutrophils 0 0 - 10 %   Metamyelocytes Relative 0 %   Myelocytes 0 %   Promyelocytes Absolute 0 %   Blasts 0 %   nRBC 5 (H) 0 /100 WBC   Neutro Abs 17.8 (H) 1.7 - 7.7 K/uL  Lymphs Abs 1.6 0.7 - 4.0 K/uL   Monocytes Absolute 0.8 0.1 - 1.0 K/uL   Eosinophils Absolute 0.0 0.0 - 0.7 K/uL   Basophils Absolute 0.0 0.0 - 0.1 K/uL   RBC Morphology RARE NRBCs    WBC Morphology INCREASED BANDS (>20% BANDS)   Protime-INR     Status: Abnormal   Collection Time: 07/01/14  2:45 PM  Result Value Ref  Range   Prothrombin Time 17.7 (H) 11.6 - 15.2 seconds   INR 1.44 0.00 - 1.49  MRSA PCR Screening     Status: None   Collection Time: 07/01/14  2:49 PM  Result Value Ref Range   MRSA by PCR NEGATIVE NEGATIVE    Comment:        The GeneXpert MRSA Assay (FDA approved for NASAL specimens only), is one component of a comprehensive MRSA colonization surveillance program. It is not intended to diagnose MRSA infection nor to guide or monitor treatment for MRSA infections.   Type and screen     Status: None   Collection Time: 07/01/14  3:20 PM  Result Value Ref Range   ABO/RH(D) B POS    Antibody Screen NEG    Sample Expiration 07/04/2014   ABO/Rh     Status: None   Collection Time: 07/01/14  3:20 PM  Result Value Ref Range   ABO/RH(D) B POS   I-STAT, chem 8     Status: Abnormal   Collection Time: 07/01/14  3:32 PM  Result Value Ref Range   Sodium 136 135 - 145 mmol/L   Potassium 5.7 (H) 3.5 - 5.1 mmol/L   Chloride 96 96 - 112 mmol/L   BUN 60 (H) 6 - 23 mg/dL   Creatinine, Ser 5.70 (H) 0.50 - 1.10 mg/dL   Glucose, Bld 133 (H) 70 - 99 mg/dL   Calcium, Ion 1.17 1.12 - 1.23 mmol/L   TCO2 22 0 - 100 mmol/L   Hemoglobin 16.0 (H) 12.0 - 15.0 g/dL   HCT 47.0 (H) 36.0 - 46.0 %  I-STAT 7, (LYTES, BLD GAS, ICA, H+H)     Status: Abnormal   Collection Time: 07/01/14  3:59 PM  Result Value Ref Range   pH, Arterial 7.238 (L) 7.350 - 7.450   pCO2 arterial 64.0 (HH) 35.0 - 45.0 mmHg   pO2, Arterial 82.0 80.0 - 100.0 mmHg   Bicarbonate 27.3 (H) 20.0 - 24.0 mEq/L   TCO2 29 0 - 100 mmol/L   O2 Saturation 93.0 %   Acid-base deficit 2.0 0.0 - 2.0 mmol/L   Sodium 133 (L) 135 - 145 mmol/L   Potassium 5.8 (H) 3.5 - 5.1 mmol/L   Calcium, Ion 1.10 (L) 1.12 - 1.23 mmol/L   HCT 43.0 36.0 - 46.0 %   Hemoglobin 14.6 12.0 - 15.0 g/dL   Sample type ARTERIAL   Anaerobic culture     Status: None (Preliminary result)   Collection Time: 07/01/14  4:18 PM  Result Value Ref Range   Specimen Description  FLUID PERITONEAL    Special Requests ON SWAB    Gram Stain      RARE WBC PRESENT, PREDOMINANTLY MONONUCLEAR NO ORGANISMS SEEN Performed at [facility]    Culture PENDING    Report Status PENDING   Body fluid culture     Status: None (Preliminary result)   Collection Time: 07/01/14  4:18 PM  Result Value Ref Range   Specimen Description FLUID PERITONEAL    Special Requests ON SWAB    Gram  Stain      RARE WBC PRESENT, PREDOMINANTLY MONONUCLEAR NO ORGANISMS SEEN Performed at [facility]    Culture NO GROWTH Performed at [facility]     Report Status PENDING   Lactic acid, plasma     Status: Abnormal   Collection Time: 07/01/14  4:30 PM  Result Value Ref Range   Lactic Acid, Venous 2.1 (HH) 0.5 - 2.0 mmol/L    Comment: REPEATED TO VERIFY CRITICAL RESULT CALLED TO, READ BACK BY AND VERIFIED WITH: H WATSON,RN 2038 07/01/14 WBOND   I-STAT 7, (LYTES, BLD GAS, ICA, H+H)     Status: Abnormal   Collection Time: 07/01/14  5:17 PM  Result Value Ref Range   pH, Arterial 7.300 (L) 7.350 - 7.450   pCO2 arterial 43.2 35.0 - 45.0 mmHg   pO2, Arterial 127.0 (H) 80.0 - 100.0 mmHg   Bicarbonate 21.5 20.0 - 24.0 mEq/L   TCO2 23 0 - 100 mmol/L   O2 Saturation 99.0 %   Acid-base deficit 5.0 (H) 0.0 - 2.0 mmol/L   Sodium 136 135 - 145 mmol/L   Potassium 4.5 3.5 - 5.1 mmol/L   Calcium, Ion 1.07 (L) 1.12 - 1.23 mmol/L   HCT 37.0 36.0 - 46.0 %   Hemoglobin 12.6 12.0 - 15.0 g/dL   Patient temperature 71.4 C    Sample type ARTERIAL   Renal function panel (daily at 1600)     Status: Abnormal   Collection Time: 07/01/14  6:13 PM  Result Value Ref Range   Sodium 139 135 - 145 mmol/L   Potassium 4.7 3.5 - 5.1 mmol/L   Chloride 102 96 - 112 mmol/L   CO2 22 19 - 32 mmol/L   Glucose, Bld 144 (H) 70 - 99 mg/dL   BUN 54 (H) 6 - 23 mg/dL   Creatinine, Ser 1.06 (H) 0.50 - 1.10 mg/dL   Calcium 8.0 (L) 8.4 - 10.5 mg/dL   Phosphorus 9.7 (H) 2.3 - 4.6 mg/dL   Albumin 2.2 (L)  3.5 - 5.2 g/dL   GFR calc non Af Amer 8 (L) >90 mL/min   GFR calc Af Amer 10 (L) >90 mL/min    Comment: (NOTE) The eGFR has been calculated using the CKD EPI equation. This calculation has not been validated in all clinical situations. eGFR's persistently <90 mL/min signify possible Chronic Kidney Disease.    Anion gap 15 5 - 15  CBC     Status: Abnormal   Collection Time: 07/01/14  6:13 PM  Result Value Ref Range   WBC 12.0 (H) 4.0 - 10.5 K/uL   RBC 4.01 3.87 - 5.11 MIL/uL   Hemoglobin 10.6 (L) 12.0 - 15.0 g/dL    Comment: REPEATED TO VERIFY DELTA CHECK NOTED    HCT 32.7 (L) 36.0 - 46.0 %   MCV 81.5 78.0 - 100.0 fL   MCH 26.4 26.0 - 34.0 pg   MCHC 32.4 30.0 - 36.0 g/dL   RDW 77.6 (H) 16.0 - 76.0 %   Platelets 108 (L) 150 - 400 K/uL    Comment: PLATELET COUNT CONFIRMED BY SMEAR DELTA CHECK NOTED   Protime-INR     Status: Abnormal   Collection Time: 07/01/14  6:13 PM  Result Value Ref Range   Prothrombin Time 21.4 (H) 11.6 - 15.2 seconds   INR 1.83 (H) 0.00 - 1.49  Triglycerides     Status: None   Collection Time: 07/01/14  6:13 PM  Result Value Ref Range  Triglycerides 49 <150 mg/dL  CBC with Differential/Platelet     Status: Abnormal   Collection Time: 07/01/14  6:13 PM  Result Value Ref Range   WBC 11.8 (H) 4.0 - 10.5 K/uL   RBC 3.99 3.87 - 5.11 MIL/uL   Hemoglobin 10.5 (L) 12.0 - 15.0 g/dL   HCT 32.9 (L) 36.0 - 46.0 %   MCV 82.5 78.0 - 100.0 fL   MCH 26.3 26.0 - 34.0 pg   MCHC 31.9 30.0 - 36.0 g/dL   RDW 19.3 (H) 11.5 - 15.5 %   Platelets 125 (L) 150 - 400 K/uL   Neutrophils Relative % 73 43 - 77 %   Lymphocytes Relative 21 12 - 46 %   Monocytes Relative 6 3 - 12 %   Eosinophils Relative 0 0 - 5 %   Basophils Relative 0 0 - 1 %   Band Neutrophils 0 0 - 10 %   Metamyelocytes Relative 0 %   Myelocytes 0 %   Promyelocytes Absolute 0 %   Blasts 0 %   nRBC 15 (H) 0 /100 WBC   Neutro Abs 8.6 (H) 1.7 - 7.7 K/uL   Lymphs Abs 2.5 0.7 - 4.0 K/uL   Monocytes  Absolute 0.7 0.1 - 1.0 K/uL   Eosinophils Absolute 0.0 0.0 - 0.7 K/uL   Basophils Absolute 0.0 0.0 - 0.1 K/uL   RBC Morphology TARGET CELLS     Comment: POLYCHROMASIA PRESENT BURR CELLS    WBC Morphology MILD LEFT SHIFT (1-5% METAS, OCC MYELO, OCC BANDS)   Magnesium     Status: None   Collection Time: 07/01/14  6:13 PM  Result Value Ref Range   Magnesium 2.0 1.5 - 2.5 mg/dL  Phosphorus     Status: Abnormal   Collection Time: 07/01/14  6:13 PM  Result Value Ref Range   Phosphorus 9.8 (H) 2.3 - 4.6 mg/dL  Comprehensive metabolic panel     Status: Abnormal   Collection Time: 07/01/14  6:13 PM  Result Value Ref Range   Sodium 140 135 - 145 mmol/L   Potassium 4.7 3.5 - 5.1 mmol/L   Chloride 102 96 - 112 mmol/L   CO2 22 19 - 32 mmol/L   Glucose, Bld 145 (H) 70 - 99 mg/dL   BUN 55 (H) 6 - 23 mg/dL   Creatinine, Ser 5.17 (H) 0.50 - 1.10 mg/dL   Calcium 8.0 (L) 8.4 - 10.5 mg/dL   Total Protein 4.5 (L) 6.0 - 8.3 g/dL   Albumin 2.3 (L) 3.5 - 5.2 g/dL   AST 1352 (H) 0 - 37 U/L   ALT 414 (H) 0 - 35 U/L   Alkaline Phosphatase 59 39 - 117 U/L   Total Bilirubin 2.2 (H) 0.3 - 1.2 mg/dL   GFR calc non Af Amer 8 (L) >90 mL/min   GFR calc Af Amer 10 (L) >90 mL/min    Comment: (NOTE) The eGFR has been calculated using the CKD EPI equation. This calculation has not been validated in all clinical situations. eGFR's persistently <90 mL/min signify possible Chronic Kidney Disease.    Anion gap 16 (H) 5 - 15  I-STAT 3, arterial blood gas (G3+)     Status: Abnormal   Collection Time: 07/01/14  7:02 PM  Result Value Ref Range   pH, Arterial 7.251 (L) 7.350 - 7.450   pCO2 arterial 60.1 (HH) 35.0 - 45.0 mmHg   pO2, Arterial 198.0 (H) 80.0 - 100.0 mmHg   Bicarbonate 26.4 (H) 20.0 -  24.0 mEq/L   TCO2 28 0 - 100 mmol/L   O2 Saturation 100.0 %   Acid-base deficit 2.0 0.0 - 2.0 mmol/L   Patient temperature 98.6 F    Collection site FEMORAL ARTERY    Drawn by VENIPUNCTURE    Sample type ARTERIAL     Comment NOTIFIED PHYSICIAN   I-STAT 3, arterial blood gas (G3+)     Status: Abnormal   Collection Time: 07/01/14  9:30 PM  Result Value Ref Range   pH, Arterial 7.300 (L) 7.350 - 7.450   pCO2 arterial 50.2 (H) 35.0 - 45.0 mmHg   pO2, Arterial 93.0 80.0 - 100.0 mmHg   Bicarbonate 24.7 (H) 20.0 - 24.0 mEq/L   TCO2 26 0 - 100 mmol/L   O2 Saturation 96.0 %   Acid-base deficit 2.0 0.0 - 2.0 mmol/L   Sample type ARTERIAL   Procalcitonin - Baseline     Status: None   Collection Time: 07/01/14 10:10 PM  Result Value Ref Range   Procalcitonin 126.89 ng/mL    Comment:        Interpretation: PCT >= 10 ng/mL: Important systemic inflammatory response, almost exclusively due to severe bacterial sepsis or septic shock. (NOTE)         ICU PCT Algorithm               Non ICU PCT Algorithm    ----------------------------     ------------------------------         PCT < 0.25 ng/mL                 PCT < 0.1 ng/mL     Stopping of antibiotics            Stopping of antibiotics       strongly encouraged.               strongly encouraged.    ----------------------------     ------------------------------       PCT level decrease by               PCT < 0.25 ng/mL       >= 80% from peak PCT       OR PCT 0.25 - 0.5 ng/mL          Stopping of antibiotics                                             encouraged.     Stopping of antibiotics           encouraged.    ----------------------------     ------------------------------       PCT level decrease by              PCT >= 0.25 ng/mL       < 80% from peak PCT        AND PCT >= 0.5 ng/mL             Continuing antibiotics                                              encouraged.       Continuing antibiotics            encouraged.    ----------------------------     ------------------------------  PCT level increase compared          PCT > 0.5 ng/mL         with peak PCT AND          PCT >= 0.5 ng/mL             Escalation of antibiotics                                           strongly encouraged.      Escalation of antibiotics        strongly encouraged.   Comprehensive metabolic panel     Status: Abnormal   Collection Time: 07/02/14  4:30 AM  Result Value Ref Range   Sodium 137 135 - 145 mmol/L   Potassium 4.5 3.5 - 5.1 mmol/L   Chloride 103 96 - 112 mmol/L   CO2 23 19 - 32 mmol/L   Glucose, Bld 128 (H) 70 - 99 mg/dL   BUN 34 (H) 6 - 23 mg/dL    Comment: DELTA CHECK NOTED   Creatinine, Ser 3.33 (H) 0.50 - 1.10 mg/dL    Comment: DIALYSIS   Calcium 8.1 (L) 8.4 - 10.5 mg/dL   Total Protein 5.2 (L) 6.0 - 8.3 g/dL   Albumin 2.4 (L) 3.5 - 5.2 g/dL   AST 757 (H) 0 - 37 U/L   ALT 455 (H) 0 - 35 U/L   Alkaline Phosphatase 65 39 - 117 U/L   Total Bilirubin 2.8 (H) 0.3 - 1.2 mg/dL   GFR calc non Af Amer 14 (L) >90 mL/min   GFR calc Af Amer 16 (L) >90 mL/min    Comment: (NOTE) The eGFR has been calculated using the CKD EPI equation. This calculation has not been validated in all clinical situations. eGFR's persistently <90 mL/min signify possible Chronic Kidney Disease.    Anion gap 11 5 - 15  CBC with Differential/Platelet     Status: Abnormal   Collection Time: 07/02/14  4:30 AM  Result Value Ref Range   WBC 13.6 (H) 4.0 - 10.5 K/uL   RBC 4.32 3.87 - 5.11 MIL/uL   Hemoglobin 11.1 (L) 12.0 - 15.0 g/dL   HCT 35.1 (L) 36.0 - 46.0 %   MCV 81.3 78.0 - 100.0 fL   MCH 25.7 (L) 26.0 - 34.0 pg   MCHC 31.6 30.0 - 36.0 g/dL   RDW 19.3 (H) 11.5 - 15.5 %   Platelets 116 (L) 150 - 400 K/uL    Comment: CONSISTENT WITH PREVIOUS RESULT   Neutrophils Relative % 84 (H) 43 - 77 %   Neutro Abs 11.4 (H) 1.7 - 7.7 K/uL   Lymphocytes Relative 5 (L) 12 - 46 %   Lymphs Abs 0.7 0.7 - 4.0 K/uL   Monocytes Relative 11 3 - 12 %   Monocytes Absolute 1.5 (H) 0.1 - 1.0 K/uL   Eosinophils Relative 0 0 - 5 %   Eosinophils Absolute 0.0 0.0 - 0.7 K/uL   Basophils Relative 0 0 - 1 %   Basophils Absolute 0.0 0.0 - 0.1 K/uL  Protime-INR     Status:  Abnormal   Collection Time: 07/02/14  4:30 AM  Result Value Ref Range   Prothrombin Time 18.3 (H) 11.6 - 15.2 seconds   INR 1.51 (H) 0.00 - 1.49  Procalcitonin     Status: None   Collection Time:  07/02/14  4:30 AM  Result Value Ref Range   Procalcitonin 135.40 ng/mL    Comment:        Interpretation: PCT >= 10 ng/mL: Important systemic inflammatory response, almost exclusively due to severe bacterial sepsis or septic shock. (NOTE)         ICU PCT Algorithm               Non ICU PCT Algorithm    ----------------------------     ------------------------------         PCT < 0.25 ng/mL                 PCT < 0.1 ng/mL     Stopping of antibiotics            Stopping of antibiotics       strongly encouraged.               strongly encouraged.    ----------------------------     ------------------------------       PCT level decrease by               PCT < 0.25 ng/mL       >= 80% from peak PCT       OR PCT 0.25 - 0.5 ng/mL          Stopping of antibiotics                                             encouraged.     Stopping of antibiotics           encouraged.    ----------------------------     ------------------------------       PCT level decrease by              PCT >= 0.25 ng/mL       < 80% from peak PCT        AND PCT >= 0.5 ng/mL             Continuing antibiotics                                              encouraged.       Continuing antibiotics            encouraged.    ----------------------------     ------------------------------     PCT level increase compared          PCT > 0.5 ng/mL         with peak PCT AND          PCT >= 0.5 ng/mL             Escalation of antibiotics                                          strongly encouraged.      Escalation of antibiotics        strongly encouraged.   Magnesium     Status: None   Collection Time: 07/02/14  4:30 AM  Result Value Ref Range   Magnesium 2.1 1.5 - 2.5 mg/dL  APTT  Status: None   Collection Time: 07/02/14   4:30 AM  Result Value Ref Range   aPTT 35 24 - 37 seconds  Phosphorus     Status: Abnormal   Collection Time: 07/02/14  4:30 AM  Result Value Ref Range   Phosphorus 5.8 (H) 2.3 - 4.6 mg/dL    Imaging: Imaging results have been reviewed  Tele- NRS  Assessment/Plan:   1. Principal Problem: 2.   Acute respiratory failure with hypoxia 3. Active Problems: 4.   Cerebral thrombosis with cerebral infarction 5.   Hypertension 6.   ESRD needing dialysis 7.   Obesity (BMI 30-39.9) 8.   Depression 9.   Left renal mass 10.   Chronic diastolic heart failure 11.   H1N1 influenza 12.   Tobacco use disorder 13.   Renal failure (ARF), acute on chronic 14.   Hyperlipidemia 15.   PSVT (paroxysmal supraventricular tachycardia) 16.   Hypoxia 17.   SOB (shortness of breath) 18.   Perforated gastric ulcer 19.   Time Spent Directly with Patient:  20 minutes  Length of Stay:  LOS: 15 days   Pt is POD #1 exploratory Lap for acute abd with perf pyloric ulcer and resection of 14 " ileum. We are seeing her for ? Arrhythmogenic embolic MCA CVA. On HD. 2D showed nl LV fxn with severer RV dysfunction, PFO with R--->L shunt. No further w/u at this time. Rhythm stable. Will be available for further questions as needed.   Lorretta Harp 07/02/2014, 10:19 AM

## 2014-07-02 NOTE — Progress Notes (Signed)
Pt does not have order for fluid removal rate. Lin MD called and phone ordered placed to keep fluid removal rate even.

## 2014-07-02 NOTE — Progress Notes (Signed)
NUTRITION FOLLOW-UP   DOCUMENTATION CODES Per approved criteria  -Obesity Unspecified   INTERVENTION: Clinimix per Pharmacy, maximize protein Nutrition goals: 1400 kcal and 120-140 grams protein per day  NUTRITION DIAGNOSIS: Inadequate oral intake  related to altered GI function as evidenced by NPO status, ongoing.   Goal: Enteral nutrition to provide 65-70% of estimated calorie needs based on ASPEN guidelines for hypocaloric, high protein feeding in critically ill obese individuals  Monitor:  TPN, weight trends, labs, I/O's  ASSESSMENT: Pt with CKD and hypertension presents with of shortness of breath and generalized weakness x 2 weeks. Pt found to have metabolic acidosis from worsening uremia, H1N1 flu. Renal ultrasound done noted mass off of left kidney. Pt started on HD. 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  Pt transferred to ICU due to acute abdominal pain. Pt s/p exp lab, evacuation of ascites, closure of ulcer, 14 in resection of ileum due to perforated pyloric ulcer with diffuse peritonitis, ischemic necrosis of mid ileum.  Pt remains intubated and on CVVHD.  Remains on high dose steroids which will limit healing.   Patient is currently intubated on ventilator support MV: 9 L/min Temp (24hrs), Avg:96.6 F (35.9 C), Min:94.6 F (34.8 C), Max:97.7 F (36.5 C)  Consult received for new initiation of TPN.   Height: Ht Readings from Last 1 Encounters:  06/26/14 5\' 5"  (1.651 m)    Weight: Wt Readings from Last 1 Encounters:  07/02/14 212 lb 1.3 oz (96.2 kg)  Usual weight: 220 lb (100 kg)  BMI:  Body mass index is 35.29 kg/(m^2). Class II obesity  Adjusted body weight: 91 kg  Estimated Nutritional Needs: Kcal: 1400 Protein: 120-140 grams Fluid: 1.2 L/day  Skin: +1 LUE, LLE edema  Diet Order: Diet NPO time specified   Intake/Output Summary (Last 24 hours) at 07/02/14 0942 Last data filed  at 07/02/14 0900  Gross per 24 hour  Intake 3413.51 ml  Output   2293 ml  Net 1120.51 ml    Last BM: 3/27 x 2 (loose)  Labs:   Recent Labs Lab 06/26/14 0731 06/27/14 0249  07/01/14 1445 07/01/14 1532  07/01/14 1717 07/01/14 1813 07/02/14 0430  NA  --  137  < > 138 136  < > 136 140  139 137  K  --  5.0  < > 5.8* 5.7*  < > 4.5 4.7  4.7 4.5  CL  --  101  < > 96 96  --   --  102  102 103  CO2  --  27  < > 24  --   --   --  22  22 23   BUN  --  73*  < > 54* 60*  --   --  55*  54* 34*  CREATININE  --  4.32*  < > 5.42* 5.70*  --   --  5.17*  5.13* 3.33*  CALCIUM  --  8.2*  < > 9.1  --   --   --  8.0*  8.0* 8.1*  MG 2.0  --   --   --   --   --   --  2.0 2.1  PHOS  --  5.6*  --   --   --   --   --  9.8*  9.7* 5.8*  GLUCOSE  --  196*  < > 134* 133*  --   --  145*  144* 128*  < > = values in this interval not displayed.  CBG (last 3)   Recent Labs  07/01/14 1326  GLUCAP 100*    Scheduled Meds: . antiseptic oral rinse  7 mL Mouth Rinse Q2H  . chlorhexidine  15 mL Mouth Rinse BID  . dextrose  1 ampule Intravenous Once  . enoxaparin (LOVENOX) injection  30 mg Subcutaneous Q24H  . ferric gluconate (FERRLECIT/NULECIT) IV  125 mg Intravenous Q M,W,F-HD  . fluconazole (DIFLUCAN) IV  400 mg Intravenous Q24H  . hydrocortisone sod succinate (SOLU-CORTEF) inj  50 mg Intravenous Q6H  . insulin regular  10 Units Subcutaneous Once  . ipratropium  0.5 mg Nebulization Q6H  . levalbuterol  0.63 mg Nebulization Q6H  . pantoprazole (PROTONIX) IV  40 mg Intravenous Q12H  . piperacillin-tazobactam  3.375 g Intravenous 4 times per day  . vancomycin  1,000 mg Intravenous Q24H    Continuous Infusions: . sodium chloride 10 mL/hr at 07/02/14 0252  . sodium chloride 125 mL/hr at 07/02/14 0252  . norepinephrine (LEVOPHED) Adult infusion 15 mcg/min (07/02/14 0742)  . dialysate (PRISMASATE) 1,500 mL/hr at 07/02/14 0857  . dialysis replacement fluid (prismasate) 350 mL/hr at 07/01/14 1903   . dialysis replacement fluid (prismasate) 350 mL/hr at 07/01/14 1904  . propofol Stopped (07/02/14 0800)   Kendell Bane RD, LDN, CNSC [phone] Pager [phone] After Hours Pager

## 2014-07-02 NOTE — Progress Notes (Signed)
Rehab admissions - Noted decline in condition and new medical issues.  Patient now on ICU.  I will sign off for acute inpatient rehab at this time.  If patient should become appropriate at a later time, please call me for re consult.  Call me for questions.  #[phone]

## 2014-07-03 ENCOUNTER — Inpatient Hospital Stay (HOSPITAL_COMMUNITY): Payer: Medicaid Other

## 2014-07-03 DIAGNOSIS — E785 Hyperlipidemia, unspecified: Secondary | ICD-10-CM

## 2014-07-03 DIAGNOSIS — I10 Essential (primary) hypertension: Secondary | ICD-10-CM

## 2014-07-03 LAB — BLOOD GAS, ARTERIAL
Acid-Base Excess: 1.1 mmol/L (ref 0.0–2.0)
Bicarbonate: 25.8 mEq/L — ABNORMAL HIGH (ref 20.0–24.0)
Drawn by: 41977
FIO2: 0.4 %
MECHVT: 450 mL
O2 Saturation: 96.9 %
PCO2 ART: 45.7 mmHg — AB (ref 35.0–45.0)
PEEP/CPAP: 5 cmH2O
Patient temperature: 98.6
RATE: 20 resp/min
TCO2: 27.2 mmol/L (ref 0–100)
pH, Arterial: 7.37 (ref 7.350–7.450)
pO2, Arterial: 100 mmHg (ref 80.0–100.0)

## 2014-07-03 LAB — GLUCOSE, CAPILLARY
GLUCOSE-CAPILLARY: 155 mg/dL — AB (ref 70–99)
GLUCOSE-CAPILLARY: 156 mg/dL — AB (ref 70–99)
GLUCOSE-CAPILLARY: 181 mg/dL — AB (ref 70–99)
Glucose-Capillary: 140 mg/dL — ABNORMAL HIGH (ref 70–99)
Glucose-Capillary: 144 mg/dL — ABNORMAL HIGH (ref 70–99)
Glucose-Capillary: 158 mg/dL — ABNORMAL HIGH (ref 70–99)

## 2014-07-03 LAB — APTT: aPTT: 38 seconds — ABNORMAL HIGH (ref 24–37)

## 2014-07-03 LAB — COMPREHENSIVE METABOLIC PANEL
ALT: 302 U/L — ABNORMAL HIGH (ref 0–35)
AST: 231 U/L — ABNORMAL HIGH (ref 0–37)
Albumin: 2 g/dL — ABNORMAL LOW (ref 3.5–5.2)
Alkaline Phosphatase: 78 U/L (ref 39–117)
Anion gap: 4 — ABNORMAL LOW (ref 5–15)
BILIRUBIN TOTAL: 1.8 mg/dL — AB (ref 0.3–1.2)
BUN: 21 mg/dL (ref 6–23)
CHLORIDE: 104 mmol/L (ref 96–112)
CO2: 29 mmol/L (ref 19–32)
CREATININE: 2.15 mg/dL — AB (ref 0.50–1.10)
Calcium: 7.6 mg/dL — ABNORMAL LOW (ref 8.4–10.5)
GFR calc non Af Amer: 24 mL/min — ABNORMAL LOW (ref >90)
GFR, EST AFRICAN AMERICAN: 28 mL/min — AB (ref >90)
GLUCOSE: 158 mg/dL — AB (ref 70–99)
Potassium: 3.7 mmol/L (ref 3.5–5.1)
Sodium: 137 mmol/L (ref 135–145)
Total Protein: 5.3 g/dL — ABNORMAL LOW (ref 6.0–8.3)

## 2014-07-03 LAB — TRIGLYCERIDES: TRIGLYCERIDES: 113 mg/dL (ref <150)

## 2014-07-03 LAB — RENAL FUNCTION PANEL
ALBUMIN: 1.9 g/dL — AB (ref 3.5–5.2)
ANION GAP: 9 (ref 5–15)
BUN: 19 mg/dL (ref 6–23)
CO2: 25 mmol/L (ref 19–32)
Calcium: 7.8 mg/dL — ABNORMAL LOW (ref 8.4–10.5)
Chloride: 102 mmol/L (ref 96–112)
Creatinine, Ser: 1.96 mg/dL — ABNORMAL HIGH (ref 0.50–1.10)
GFR, EST AFRICAN AMERICAN: 31 mL/min — AB (ref >90)
GFR, EST NON AFRICAN AMERICAN: 27 mL/min — AB (ref >90)
Glucose, Bld: 158 mg/dL — ABNORMAL HIGH (ref 70–99)
PHOSPHORUS: 2 mg/dL — AB (ref 2.3–4.6)
POTASSIUM: 3.8 mmol/L (ref 3.5–5.1)
SODIUM: 136 mmol/L (ref 135–145)

## 2014-07-03 LAB — MAGNESIUM: MAGNESIUM: 2.2 mg/dL (ref 1.5–2.5)

## 2014-07-03 LAB — CBC
HCT: 31.3 % — ABNORMAL LOW (ref 36.0–46.0)
HEMOGLOBIN: 10 g/dL — AB (ref 12.0–15.0)
MCH: 26 pg (ref 26.0–34.0)
MCHC: 31.9 g/dL (ref 30.0–36.0)
MCV: 81.5 fL (ref 78.0–100.0)
PLATELETS: 69 10*3/uL — AB (ref 150–400)
RBC: 3.84 MIL/uL — ABNORMAL LOW (ref 3.87–5.11)
RDW: 19.4 % — ABNORMAL HIGH (ref 11.5–15.5)
WBC: 13.8 10*3/uL — ABNORMAL HIGH (ref 4.0–10.5)

## 2014-07-03 LAB — PHOSPHORUS: Phosphorus: 3 mg/dL (ref 2.3–4.6)

## 2014-07-03 LAB — PROCALCITONIN: Procalcitonin: 101.83 ng/mL

## 2014-07-03 MED ORDER — HEPARIN SODIUM (PORCINE) 1000 UNIT/ML DIALYSIS
1000.0000 [IU] | INTRAMUSCULAR | Status: DC | PRN
  Filled 2014-07-03: qty 6

## 2014-07-03 MED ORDER — PRISMASOL BGK 4/2.5 32-4-2.5 MEQ/L IV SOLN
INTRAVENOUS | Status: DC
  Administered 2014-07-03 – 2014-07-04 (×8): via INTRAVENOUS_CENTRAL
  Filled 2014-07-03 (×15): qty 5000

## 2014-07-03 MED ORDER — ANTICOAGULANT SODIUM CITRATE 4% (200MG/5ML) IV SOLN
5.0000 mL | Status: DC | PRN
Start: 2014-07-03 — End: 2014-08-04
  Administered 2014-07-10: 4.2 mL via INTRAVENOUS
  Filled 2014-07-03 (×5): qty 250

## 2014-07-03 MED ORDER — TRACE MINERALS CR-CU-F-FE-I-MN-MO-SE-ZN IV SOLN
INTRAVENOUS | Status: AC
  Administered 2014-07-03: 17:00:00 via INTRAVENOUS
  Filled 2014-07-03: qty 1992

## 2014-07-03 NOTE — Progress Notes (Signed)
Name: [name] MRN: [id number] DOB: [birth year]    ADMISSION DATE:  06/17/2014 CONSULTATION DATE:  3/30  REFERRING MD :  Mahala MenghiniSamtani   CHIEF COMPLAINT:  Hypoxia   BRIEF PATIENT DESCRIPTION: 59 yo female smoker with hx HTN, ESRD on HD (just began this admit), COPD, failed L AV fistula, hx COPD, initially admitted 3/16 with flu, acute hypoxic resp failure.  [facility] worsening metabolic parameters and need for HD.  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  Has made little improvement and on 3/30 has worsening hypoxia, generalized pain, hyperkalemia, worsening leukocytosis and PCCM consulted.     SIGNIFICANT EVENTS  3/31 to or p lap  STUDIES:  3/22 TEE>>> severe RAE, severely reduced RV function, severe TR, no LAA thrombus   SUBJECTIVE:  Pressors weaning  VITAL SIGNS: Temp:  [96.4 F (35.8 C)-99.1 F (37.3 C)] 97.3 F (36.3 C) (04/01 1000) Pulse Rate:  [69-98] 84 (04/01 1000) Resp:  [20] 20 (04/01 0903) SpO2:  [96 %-100 %] 100 % (04/01 1000) Arterial Line BP: (102-148)/(57-81) 113/61 mmHg (04/01 1000) FiO2 (%):  [40 %-60 %] 40 % (04/01 0903) Weight:  [212 lb 8.4 oz (96.4 kg)] 212 lb 8.4 oz (96.4 kg) (04/01 0447)  PHYSICAL EXAMINATION: General:  Chronically ill appearing female, sedated on vent, but follows commands, on cvvh Neuro: follows commands HEENT:  Mm dry,OTT, no JVD  Cardiovascular:  s1s2 rrr Lungs:  resps even non labored on vent, diminished bases Abdomen:  Round, mildly distended, tender, hypoactive bs , dressing intact Musculoskeletal:  Warm and dry, no sig edema    Recent Labs Lab 07/02/14 0430 07/02/14 1600 07/03/14 0445  NA 137 137 137  K 4.5 4.3 3.7  CL 103 103 104  CO2 23 25 29   BUN 34* 25* 21  CREATININE 3.33* 2.71* 2.15*  GLUCOSE 128* 102* 158*    Recent Labs Lab 07/01/14 1813 07/02/14 0430 07/03/14 0445  HGB 10.5*  10.6* 11.1* 10.0*  HCT 32.9*  32.7* 35.1* 31.3*  WBC 11.8*  12.0* 13.6*  13.8*  PLT 125*  108* 116* 69*   Ct Abdomen Pelvis Wo Contrast  07/01/2014   CLINICAL DATA:  Renal failure and hypertension.  Respiratory failure  EXAM: CT CHEST, ABDOMEN AND PELVIS WITHOUT CONTRAST  TECHNIQUE: Multidetector CT imaging of the chest, abdomen and pelvis was performed following the standard protocol without IV contrast.  COMPARISON:  None.  FINDINGS: CT CHEST FINDINGS  THORACIC INLET/BODY WALL:  Right IJ central line is in good position.  MEDIASTINUM:  Cardiomegaly which is mild. No pericardial effusion. Diffuse atherosclerosis, including the coronary arteries. No evidence of acute vascular abnormality.  LUNG WINDOWS:  Complete opacification of the left lower lobe with volume loss. Trace left pleural effusion. Patchy nodular densities in the posterior mid lungs is are likely atelectatic or inflammatory.  OSSEOUS:  No acute fracture.  No suspicious lytic or blastic lesions.  CT ABDOMEN AND PELVIS FINDINGS  BODY WALL: No contributory findings.  Liver: Flattened appearance of the liver margins of uncertain significance. Given the fluid level, a subcapsular collection is not suspected.  Biliary: No evidence of biliary obstruction or stone.  Pancreas: Unremarkable.  Spleen: Unremarkable.  Adrenals: Unremarkable.  Kidneys and ureters: Symmetric perinephric edema. There is bilateral smooth renal atrophy. An 18 mm presumed cyst is present in the interpolar left kidney. No hydronephrosis.  Bladder: Unremarkable.  Reproductive: Calcified fibroid in the posterior uterus measuring approximately  2 cm.  Bowel: There is probable pneumatosis of pelvic small bowel loops, the presumed source of large pneumoperitoneum. There is also moderate ascites throughout the abdomen, with bubbles of non floating gas in the rectovaginal recess, likely feculent material. No obstruction above the pneumatosis. No portal venous gas.  Vascular: 29 mm infrarenal fusiform aortic aneurysm. No visible vascular findings to explain the  above.  OSSEOUS: No acute abnormalities.  Critical Value/emergent results were called by telephone at the time of interpretation on 07/01/2014 at 1:08 pm to Dr. Danford Bad , who verbally acknowledged these results.  IMPRESSION: 1. Perforated bowel with large pneumoperitoneum. Ileal pneumatosis suggests small bowel ischemia/necrosis. In this patient with recent embolic stroke, suspect mesenteric embolus. 2. Moderate ascites with feculent material or developing abscess in the rectovaginal recess. 3. Left lower lobe collapse. 4. 29 mm infrarenal aortic aneurysm.   Electronically Signed   By: Marnee Spring M.D.   On: 07/01/2014 13:17   Ct Chest Wo Contrast  07/01/2014   CLINICAL DATA:  Renal failure and hypertension.  Respiratory failure  EXAM: CT CHEST, ABDOMEN AND PELVIS WITHOUT CONTRAST  TECHNIQUE: Multidetector CT imaging of the chest, abdomen and pelvis was performed following the standard protocol without IV contrast.  COMPARISON:  None.  FINDINGS: CT CHEST FINDINGS  THORACIC INLET/BODY WALL:  Right IJ central line is in good position.  MEDIASTINUM:  Cardiomegaly which is mild. No pericardial effusion. Diffuse atherosclerosis, including the coronary arteries. No evidence of acute vascular abnormality.  LUNG WINDOWS:  Complete opacification of the left lower lobe with volume loss. Trace left pleural effusion. Patchy nodular densities in the posterior mid lungs is are likely atelectatic or inflammatory.  OSSEOUS:  No acute fracture.  No suspicious lytic or blastic lesions.  CT ABDOMEN AND PELVIS FINDINGS  BODY WALL: No contributory findings.  Liver: Flattened appearance of the liver margins of uncertain significance. Given the fluid level, a subcapsular collection is not suspected.  Biliary: No evidence of biliary obstruction or stone.  Pancreas: Unremarkable.  Spleen: Unremarkable.  Adrenals: Unremarkable.  Kidneys and ureters: Symmetric perinephric edema. There is bilateral smooth renal atrophy. An 18  mm presumed cyst is present in the interpolar left kidney. No hydronephrosis.  Bladder: Unremarkable.  Reproductive: Calcified fibroid in the posterior uterus measuring approximately 2 cm.  Bowel: There is probable pneumatosis of pelvic small bowel loops, the presumed source of large pneumoperitoneum. There is also moderate ascites throughout the abdomen, with bubbles of non floating gas in the rectovaginal recess, likely feculent material. No obstruction above the pneumatosis. No portal venous gas.  Vascular: 29 mm infrarenal fusiform aortic aneurysm. No visible vascular findings to explain the above.  OSSEOUS: No acute abnormalities.  Critical Value/emergent results were called by telephone at the time of interpretation on 07/01/2014 at 1:08 pm to Dr. Danford Bad , who verbally acknowledged these results.  IMPRESSION: 1. Perforated bowel with large pneumoperitoneum. Ileal pneumatosis suggests small bowel ischemia/necrosis. In this patient with recent embolic stroke, suspect mesenteric embolus. 2. Moderate ascites with feculent material or developing abscess in the rectovaginal recess. 3. Left lower lobe collapse. 4. 29 mm infrarenal aortic aneurysm.   Electronically Signed   By: Marnee Spring M.D.   On: 07/01/2014 13:17   Dg Chest Port 1 View  07/03/2014   CLINICAL DATA:  Intubation .  EXAM: PORTABLE CHEST - 1 VIEW  COMPARISON:  07/02/2014.  FINDINGS: Endotracheal tube and NG tube in stable position. Left IJ line and right IJ dialysis catheter  stable position. Patient rotated to the left. Heart size stable. Pulmonary vascularity normal . Persistent left lower lobe atelectasis and infiltrate. Small left pleural effusion cannot be excluded. No pneumothorax. No acute osseous abnormality .  IMPRESSION: 1. Lines and tubes in stable position. 2. Persistent dense left lower lobe atelectasis and infiltrate. No interim change. Small left pleural effusion cannot be excluded .   Electronically Signed   By: Maisie Fus   Register   On: 07/03/2014 07:35   Dg Chest Portable 1 View  07/02/2014   CLINICAL DATA:  Atelectasis.  EXAM: PORTABLE CHEST - 1 VIEW  COMPARISON:  None.  FINDINGS: Interim intubation with endotracheal tube tip 5.2 cm above the carina. Interim placement of NG tube, its tip is projected below left hemidiaphragm. Right dialysis catheter, left IJ catheter in stable position.Stable cardiomegaly. No pulmonary venous congestion. Persistent left lower lobe atelectasis and infiltrate with possible small left pleural effusion. No pneumothorax.  IMPRESSION: 1. Interim intubation. Its tip is 5.2 cm above the carina. Interim NG tube placement. Its tip is below the left hemidiaphragm. 2. Persistent left lower lobe atelectasis and infiltrate. Small left pleural effusion cannot be excluded. 3. Stable cardiomegaly.   Electronically Signed   By: Maisie Fus  Register   On: 07/02/2014 07:14   Dg Chest Port 1 View  07/01/2014   CLINICAL DATA:  Status post central line placement;History of asthma, chronic renal insufficiency, and gout  EXAM: PORTABLE CHEST - 1 VIEW  COMPARISON:  Portable chest x-ray of July 01, 2014 at 7:29 a.m.  FINDINGS: The right lung is adequately inflated and clear. On the left there is progressive volume loss with shift of the mediastinum toward the left. There is dense infiltrate and/or pleural effusion in the left lower hemi thorax. The left heart border is obscured. The central pulmonary vascularity is prominent. The dialysis catheter on the right is unchanged in position with the tip in the proximal SVC. A newly placed left internal jugular venous catheter tip projects at the junction of the right and left brachiocephalic veins.  IMPRESSION: There is no postprocedure complication following placement of a left internal jugular venous catheter the dialysis catheter is unchanged. There is persistent left lower lobe atelectasis and probable pleural effusion.   Electronically Signed   By: David  [place]   On:  07/01/2014 15:27    ASSESSMENT / PLAN:  Acute hypoxemic respiratory failure - multifactorial initially r/t H1N1, volume overload in setting ESRD.   COPD  LLL Atelectasis/ collapse ?HCAP  ?OSA Post op respiratory failure - Maintain on full vent support. - Titrate O2 for sat of 88-92%. - Pressure support trials today.  CARDIAC Chronic dCHF - EF 60-65%, grade 1 diastolic dysfunction PSVT resolved. Now in septic shock. Plan -  - Levophed for MAP of 65 mmHg. - Hold anti-HTN - See ID section.  GI - CDiff neg, denies n/v/d. LFT's ok, s/p exp lap for pyloric ulcer. Plan -  - Per surgery. - NPO. - Protonix. - TPN.  ID ABX post lap, ?hcap  3/30 diflucan>> 3/30 pip-taz>> 3/31 vanc>>  3/30 anaerobic>> 3/28 bc>> 3/26 c dif>>neg  Renal: Hyperkalemia  ESRD Failed AV fistula - using temp HD cath  Plan -  - CVVH even. - BMET in AM. - Replace electrolytes as indicated. Margaretha Glassing IVF  Neuro Acute stroke Follows commands  Plan -  - Minimize sedation. - D/C propofol. - Target RASS of 0. - Fentanyl for pain control.  No family bedside.  Begin PS trials,  wean pressors and CVVH at even.  Will SBT as tolerated.   Brett Canales Minor ACNP [facility] PCCM Pager [phone] till 3 pm If no answer page [phone] 07/03/2014, 10:47 AM   Attending Note:  I have examined patient, reviewed labs, studies and notes. I have discussed the case with S Minor, and I agree with the data and plans as amended above. Pt on MV and CVVHD s/p pyloric ulcer repair, CVA.  More awake on exam. Comfortable on MV. Believe we can begin to perform SBT's, work for extubation. Would attempt an extubation to assess airway protection before committing her to possible trach. Independent critical care time is 35 minutes.   Levy Pupa, MD, PhD 07/03/2014, 2:12 PM [facility] [phone] or if no answer [phone]

## 2014-07-03 NOTE — Progress Notes (Signed)
[facility] Progress Note  Assessment/Plan: 1. Perforated pyloric ulcer and ischemic bowel-s/p surgery on 3/30- now on vanc and zosyn.  Ischemic bowel could have been embolic this admit.  No heparin with CRRT   2. CVA/cryptogenic stroke - acute ACA, left hemiparesis; neuro following - 3. ESRD - new start this admission (sister [facility] problems with AVF 3/25 - had fistulagram 3/21 without intervention - showed focal aneurysmal dilitation and competing venous outflow in the deep venous system -VVS wanting to wait for her to recover before revision of AVF is considered -  - right IJ PC placed 3/26 - - catheter poorly functional  Initially but now better-using it now for CRRT given hemodynamic instability.  Will keep on CRRT another 24 hours given instability- change fluids to all 4 K 4. Anemia - Hgb previously good- CKD plus surgery- follow- no ESA 5. Secondary hyperparathyroidism - iPTH 338 - 3/21, 531 3/18 - started hectorol 1- also on phoslo but not eating 6. HTN/volume -  Now on pressors.  Did daily HD for 2 days prior to surgery so feel like is close to euvolemic- running even 7. Nutrition - alb 2.6 + vit- surgery will not help that  8. COPD/tobacco - encouraged smoking cessation. 9. S/p tx for Infuenza A/HIN1 10. Severe R HF per TEE  11. Renal mass per US 3/16 - "Indeterminate 1.9 cm hypoechoic mass off the interpolar region of the left kidney. This may potentially represent a cyst however solid mass is not excluded. Recommend follow-up in the outpatient setting with eitherpre and post enhanced CT or MRI." 12. DM - per primary BS 70 - 120 14. Increased WBC- was peritonitis- is correcting 15.  Dispo- certainly seems like patient has not thrived well with this whole thing. Will need to see how she recovers from this latest set back   Malarie Tappen A   07/03/2014, 8:44 AM    Subjective:  Stable overnight, pressors weaned- down to 40 % FIo2-  CVP reading from 20-12 ?  Objective Filed Vitals:   07/03/14 0600 07/03/14 0700 07/03/14 0730 07/03/14 0800  BP:      Pulse: 71 69 72 89  Temp: 96.6 F (35.9 C) 96.8 F (36 C) 97 F (36.1 C) 97.2 F (36.2 C)  TempSrc:    Core (Comment)  Resp:      Height:      Weight:      SpO2: 100% 100% 100% 100%   Physical Exam General: sedated, intubated Heart: RRR Lungs: coarse BS clear some with cough Abdomen: soft + BS, epigastric tenderness -no rebound/guarding Extremities:1+ LE edema Dialysis Access: new right IJ and left upper AVF + bruit - bruising  Dialysis Orders: none - new start to HD  Additional Objective Labs: Basic Metabolic Panel:  Recent Labs Lab 07/02/14 0430 07/02/14 1600 07/03/14 0445  NA 137 137 137  K 4.5 4.3 3.7  CL 103 103 104  CO2 23 25 29   GLUCOSE 128* 102* 158*  BUN 34* 25* 21  CREATININE 3.33* 2.71* 2.15*  CALCIUM 8.1* 8.2* 7.6*  PHOS 5.8* 4.1 3.0   Liver Function Tests:  Recent Labs Lab 07/01/14 1813 07/02/14 0430 07/02/14 1600 07/03/14 0445  AST 1352* 757*  --  231*  ALT 414* 455*  --  302*  ALKPHOS 59 65  --  78  BILITOT 2.2* 2.8*  --  1.8*  PROT 4.5* 5.2*  --  5.3*  ALBUMIN 2.3*  2.2*  2.4* 2.2* 2.0*   CBC:  Recent Labs Lab 07/01/14 0318 07/01/14 1445  07/01/14 1813 07/02/14 0430 07/03/14 0445  WBC 19.2* 20.2*  --  11.8*  12.0* 13.6* 13.8*  NEUTROABS  --  17.8*  --  8.6* 11.4*  --   HGB 14.9 13.4  < > 10.5*  10.6* 11.1* 10.0*  HCT 44.7 41.7  < > 32.9*  32.7* 35.1* 31.3*  MCV 81.3 82.6  --  82.5  81.5 81.3 81.5  PLT 189 154  --  125*  108* 116* 69*  < > = values in this interval not displayed. CBG:  Recent Labs Lab 07/01/14 1326 07/02/14 2055 07/02/14 2345 07/03/14 0441 07/03/14 0738  GLUCAP 100* 157* 158* 155* 156*  Medications: . sodium chloride 10 mL/hr at 07/02/14 0252  . sodium chloride 125 mL/hr at 07/02/14 1148  . norepinephrine (LEVOPHED) Adult infusion 2 mcg/min (07/03/14 0745)  . dialysate  (PRISMASATE) 1,500 mL/hr at 07/03/14 0617  . dialysis replacement fluid (prismasate) 350 mL/hr at 07/03/14 0030  . dialysis replacement fluid (prismasate) 350 mL/hr at 07/03/14 0030  . TPN (CLINIMIX) Adult without lytes 40 mL/hr at 07/02/14 1815   . antiseptic oral rinse  7 mL Mouth Rinse Q2H  . chlorhexidine  15 mL Mouth Rinse BID  . dextrose  1 ampule Intravenous Once  . enoxaparin (LOVENOX) injection  30 mg Subcutaneous Q24H  . ferric gluconate (FERRLECIT/NULECIT) IV  125 mg Intravenous Q M,W,F-HD  . fluconazole (DIFLUCAN) IV  400 mg Intravenous Q24H  . hydrocortisone sod succinate (SOLU-CORTEF) inj  50 mg Intravenous Q6H  . insulin aspart  0-9 Units Subcutaneous 6 times per day  . ipratropium  0.5 mg Nebulization Q6H  . levalbuterol  0.63 mg Nebulization Q6H  . pantoprazole (PROTONIX) IV  40 mg Intravenous Q12H  . piperacillin-tazobactam  3.375 g Intravenous 4 times per day  . vancomycin  1,000 mg Intravenous Q24H

## 2014-07-03 NOTE — Progress Notes (Signed)
STROKE TEAM PROGRESS NOTE    SUBJECTIVE (INTERVAL HISTORY) Her family is not at the bedside. She was intubated and not on sedation. She is awake and alert and following commands. She is day 2 post exploratory lap due to perforated pyloric ulcer with resection of part of the ileum. She is having dialysis at bedside during round.  Overall her neuro condition is unchanged, awake alert following commands with left hemiplegia. We were consulted before for the embolic stroke on MRI. Had TEE which showed no intra-atrial clot but positive bubble study indicating of a small PFO. LE venous dopplers 06/18/14 showed no DVT. She still has PSVT for the last several days. Cardiology is following and no afib has been detected yet. Dr Graciela Husbands feels her pretest likelihood of having atrial fibrillation is high enough to justify 30 day outpatient monitor and not place a loop recorder and hence it will be done as an outpatient.  OBJECTIVE Temp:  [96.4 F (35.8 C)-99.1 F (37.3 C)] 97.3 F (36.3 C) (04/01 1100) Pulse Rate:  [69-98] 78 (04/01 1100) Cardiac Rhythm:  [-] Normal sinus rhythm (04/01 0800) Resp:  [20] 20 (04/01 0903) SpO2:  [96 %-100 %] 99 % (04/01 1100) Arterial Line BP: (102-148)/(57-81) 116/62 mmHg (04/01 1100) FiO2 (%):  [40 %-60 %] 40 % (04/01 0903) Weight:  [212 lb 8.4 oz (96.4 kg)] 212 lb 8.4 oz (96.4 kg) (04/01 0447)   Recent Labs Lab 07/01/14 1326 07/02/14 2055 07/02/14 2345 07/03/14 0441 07/03/14 0738  GLUCAP 100* 157* 158* 155* 156*    Recent Labs Lab 06/27/14 0249  07/01/14 1445 07/01/14 1532  07/01/14 1717 07/01/14 1813 07/02/14 0430 07/02/14 1600 07/03/14 0445  NA 137  < > 138 136  < > 136 140  139 137 137 137  K 5.0  < > 5.8* 5.7*  < > 4.5 4.7  4.7 4.5 4.3 3.7  CL 101  < > 96 96  --   --  102  102 103 103 104  CO2 27  < > 24  --   --   --  22  22 23 25 29   GLUCOSE 196*  < > 134* 133*  --   --  145*  144* 128* 102* 158*  BUN 73*  < > 54* 60*  --   --  55*  54* 34*  25* 21  CREATININE 4.32*  < > 5.42* 5.70*  --   --  5.17*  5.13* 3.33* 2.71* 2.15*  CALCIUM 8.2*  < > 9.1  --   --   --  8.0*  8.0* 8.1* 8.2* 7.6*  MG  --   --   --   --   --   --  2.0 2.1  --  2.2  PHOS 5.6*  --   --   --   --   --  9.8*  9.7* 5.8* 4.1 3.0  < > = values in this interval not displayed.  Recent Labs Lab 06/29/14 0810 07/01/14 0318 07/01/14 1813 07/02/14 0430 07/02/14 1600 07/03/14 0445  AST 40* 91* 1352* 757*  --  231*  ALT 47* 64* 414* 455*  --  302*  ALKPHOS 72 105 59 65  --  78  BILITOT 2.2* 2.4* 2.2* 2.8*  --  1.8*  PROT 6.1 6.7 4.5* 5.2*  --  5.3*  ALBUMIN 2.5* 2.5* 2.3*  2.2* 2.4* 2.2* 2.0*    Recent Labs Lab 07/01/14 0318 07/01/14 1445  07/01/14 1559 07/01/14 1717 07/01/14  1813 07/02/14 0430 07/03/14 0445  WBC 19.2* 20.2*  --   --   --  11.8*  12.0* 13.6* 13.8*  NEUTROABS  --  17.8*  --   --   --  8.6* 11.4*  --   HGB 14.9 13.4  < > 14.6 12.6 10.5*  10.6* 11.1* 10.0*  HCT 44.7 41.7  < > 43.0 37.0 32.9*  32.7* 35.1* 31.3*  MCV 81.3 82.6  --   --   --  82.5  81.5 81.3 81.5  PLT 189 154  --   --   --  125*  108* 116* 69*  < > = values in this interval not displayed. No results for input(s): CKTOTAL, CKMB, CKMBINDEX, TROPONINI in the last 168 hours.  Recent Labs  07/01/14 1445 07/01/14 1813 07/02/14 0430  LABPROT 17.7* 21.4* 18.3*  INR 1.44 1.83* 1.51*   No results for input(s): COLORURINE, LABSPEC, PHURINE, GLUCOSEU, HGBUR, BILIRUBINUR, KETONESUR, PROTEINUR, UROBILINOGEN, NITRITE, LEUKOCYTESUR in the last 72 hours.  Invalid input(s): APPERANCEUR     Component Value Date/Time   CHOL 156 06/20/2014 2037   TRIG 113 07/03/2014 0445   HDL 20* 06/20/2014 2037   CHOLHDL 7.8 06/20/2014 2037   VLDL 59* 06/20/2014 2037   LDLCALC 77 06/20/2014 2037   Lab Results  Component Value Date   HGBA1C 6.5* 06/20/2014   No results found for: LABOPIA, COCAINSCRNUR, LABBENZ, AMPHETMU, THCU, LABBARB  No results for input(s): ETH in the last 168  hours.   I have personally reviewed the radiological images below and agree with the radiology interpretations.  Ct Head Wo Contrast 06/19/2014    Acute infarct right anterior cerebral artery territory.    US Renal 06/18/2014    Indeterminate 1.9 cm hypoechoic mass off the interpolar region of the left kidney. Evaluation is markedly limited due to body habitus. This may potentially represent a cyst however solid mass is not excluded. Recommend follow-up in the outpatient setting with either pre and post enhanced CT or MRI.  Echogenic right kidney most compatible with chronic medical renal disease.  No hydronephrosis.  Fluid within the pelvis.    2-D echo - Normal LV function; grade 1 diastolic dysfunction; systolic and diastolic septal flattening; moderate RVE with severely reduced function; severe RAE with right to left septal bowing; severe TR;findings consistent with severely elevated pulmonary pressure.  LE venous Doppler - There is no obvious evidence of DVT or SVT noted in the bilateral lower extremities. Doppler waveforms are pulsatile suggestive of fluid overload.  MRI and MRA Multifocal areas of acute infarction throughout both hemispheres, with the largest confluent area involving the RIGHT medial parasagittal frontal and posterior frontal cortex, distal ACA territory. No proximal large vessel occlusion on MRA intracranial exam.  CUS - Bilateral: 1-39% ICA stenosis. Vertebral artery flow is antegrade.   TEE - normal LV function; no LAA thrombus; severe RAE; mild RVE; severely reduced RV function; severe TR; bowing of atrial septum right to left; positive saline microcavitation study.   PHYSICAL EXAM  Temp:  [96.4 F (35.8 C)-99.1 F (37.3 C)] 97.3 F (36.3 C) (04/01 1100) Pulse Rate:  [69-98] 78 (04/01 1100) Resp:  [20] 20 (04/01 0903) SpO2:  [96 %-100 %] 99 % (04/01 1100) Arterial Line BP: (102-148)/(57-81) 116/62 mmHg (04/01 1100) FiO2 (%):  [40 %-60 %] 40 % (04/01  0903) Weight:  [212 lb 8.4 oz (96.4 kg)] 212 lb 8.4 oz (96.4 kg) (04/01 0447)  General - obese middle aged lady, well developed,  intubated but awake alert.  Ophthalmologic - fundi not visualized due to incorporation.  Cardiovascular - regular rate and rhythm.  Neuro - intubated but not on sedation. Awake, alert, eyes open, following central and peripheral commands, PERRL, moving to all directions, positive corneal and gag and cough reflexes. LUE 0/5 and LLE 0/5, RUE and RLE 4+/5. Reflex 1+ and no babinski. Sensation, coordination, gait not tested.  ASSESSMENT/PLAN [name] is a 59 y.o. female with history of HTN, ESRD recently on HD, COPD presenting with generalized weakness and fluid overload on 06/17/2014. Started on hemodialysis. However she was found to have left-sided weakness after admission. CT head concerning for right ACA infarct. MRI showed bilateral infarcts, embolic pattern. Stroke workup including MRA, carotid Doppler, 2-D echo, TEE, antiphospholipid antibodies all negative except positive PFO. LE venous Doppler negative for DVT. A1c 6.5 and LDL 77. Found to have arrhythmia, cardiology on board, considered PSVT, so far no A. fib detected. Unfortunately, patient had a bowel perforation s/p surgery. Currently patient was intubated on hemodialysis, however, her neurological condition stable, awake alert and following commands still has left hemiplegia. Consider resume aspirin when able and outpatient 30 day cardiac monitoring to detect A. fib.   Stroke: acute embolicinfarcts involving right ACA, left PCA, and left MCA, cardioembolic pattern.  Resultant  left facial droop, left hemiparesis, neuro stable, no significance and your changes.  MRI  embolic infarcts involving right ACA, left PCA and MCA  MRA  unremarkable  Carotid Doppler unremarkable     2D Echo  unremarkable  LE Venous Doppler no DVT  TEE with positive bubble.  Cardiology has consulted. Does not has AF but  PSVT. Cardiology feels her pretest likelihood of having atrial fibrillation is high enough to justify 30 day outpatient monitor and does not need loop recorder.  HgbA1c 6.5  Hypercoagulable workup: negative lupus anticoagulant and antiphospholipid antibodies  Heparin sq for VTE prophylaxis Diet NPO time specified TPN (CLINIMIX) Adult without lytes  TPN (CLINIMIX) Adult without lytes   no antithrombotic prior to admission, was on aspirin 81 mg orally every day up to admission.  now antiplatelet now hold due to surgical procedure.  Patient counseled to be compliant with her antithrombotic medications  Ongoing aggressive stroke risk factor management  Aggressive PT/OT  Therapy recommendations:  pending   Disposition:  Pending  Pyloric ulcer perforation  Surgery on board  S/p exploratory lap with resection of part of the ileum   manage as per primary service  ESRD recently on HD  Nephrology on board  On hemodialysis  Hypertension  Home meds:   Lasix  On metoprolol  Stable  Hyperlipidemia  LDL 77, not at goal  Lipitor currently on hold   Resume was able   Continue statin on discharge  Tobacco abuse  Current smoker  Smoking cessation counseling provided  Pt is willing to quit  Other Stroke Risk Factors  Advanced age  Morbid obesity, Body mass index is 35.37 kg/(m^2).   COPD with CO2 retention  Other Active Problems  Leukocytosis  Other Pertinent History  Hyponatremia  Hospital day # 16  This patient is critically ill due to cardioembolic stroke, cardiac arrhythmia, perforated pyloric ulcer s/p surgery, ESRD on dialysis and at significant risk of neurological worsening, death form recurrent stroke, sepsis, arrhythmia. This patient's care requires constant monitoring of vital signs, hemodynamics, respiratory and cardiac monitoring, review of multiple databases, neurological assessment, discussion with family, other specialists and medical  decision making of high complexity.  I spent 35 minutes of neurocritical care time in the care of this patient.  Neurology will sign off. Please call with further questions. Follow-up as an outpatient in stroke clinic in 2 months. Thanks for the consult.  Marvel PlanJindong Cherylann Hobday, MD PhD Stroke Neurology 07/03/2014 11:56 AM   To contact Stroke Continuity provider, please refer to [email or web address]. After hours, contact General Neurology

## 2014-07-03 NOTE — Progress Notes (Signed)
PARENTERAL NUTRITION CONSULT NOTE - Follow-up  Pharmacy Consult for TPN Indication: prolonged ileus  No Known Allergies  Patient Measurements: Height: 5\' 5"  (165.1 cm) Weight: 212 lb 8.4 oz (96.4 kg) IBW/kg (Calculated) : 57  Adjusted body weight: 67  Vital Signs: Temp: 96.8 F (36 C) (04/01 0700) Pulse Rate: 69 (04/01 0700) Intake/Output from previous day: 03/31 0701 - 04/01 0700 In: 4115.2 [I.V.:3045.2; IV Piggyback:600; TPN:470] Out: 3975 [Urine:10; Drains:110] Intake/Output from this shift:    Labs:  Recent Labs  07/01/14 1445  07/01/14 1813 07/02/14 0430 07/03/14 0445  WBC 20.2*  --  11.8*  12.0* 13.6* 13.8*  HGB 13.4  < > 10.5*  10.6* 11.1* 10.0*  HCT 41.7  < > 32.9*  32.7* 35.1* 31.3*  PLT 154  --  125*  108* 116* 69*  APTT  --   --   --  35 38*  INR 1.44  --  1.83* 1.51*  --   < > = values in this interval not displayed.   Recent Labs  07/01/14 1813 07/02/14 0430 07/02/14 1600 07/03/14 0445  NA 140  139 137 137 137  K 4.7  4.7 4.5 4.3 3.7  CL 102  102 103 103 104  CO2 22  22 23 25 29   GLUCOSE 145*  144* 128* 102* 158*  BUN 55*  54* 34* 25* 21  CREATININE 5.17*  5.13* 3.33* 2.71* 2.15*  CALCIUM 8.0*  8.0* 8.1* 8.2* 7.6*  MG 2.0 2.1  --  2.2  PHOS 9.8*  9.7* 5.8* 4.1 3.0  PROT 4.5* 5.2*  --  5.3*  ALBUMIN 2.3*  2.2* 2.4* 2.2* 2.0*  AST 1352* 757*  --  231*  ALT 414* 455*  --  302*  ALKPHOS 59 65  --  78  BILITOT 2.2* 2.8*  --  1.8*  TRIG 49  --   --  113   Estimated Creatinine Clearance: 32.8 mL/min (by C-G formula based on Cr of 2.15).    Recent Labs  07/02/14 2345 07/03/14 0441 07/03/14 0738  GLUCAP 158* 155* 156*    Insulin Requirements in the past 24 hours:  8 units Novolog SSI, on solucortef 50 IV q6h  Current Nutrition:  NPO Clinimix 5/15 at 5740ml/hr provides 48 gm protein and 683 kcal Goal: Clinimix 5/15 at 83 ml which will provide 100 gm protein and 1414 kcal (will be impossible to meet high protein and low  kcal goals utilizing pre-made TPN)  Nutritional Goals: per RD 3/31 1400 kCal, 120-140 grams of protein per day  Assessment: 59 yo F with complicated hospital course. S/p closure perforated pyloric ulcer, SBR for ischemic necrosis, diffuse peritonitis 3/30. Pharmacy consulted to provide TPN for nutrition support.   GI: NPO. TPN. Prealbumin pending.  Endo: CBGs at goal (<180 in ICU pt), remains on stress dose steroids  Lytes: K 3.7 (goal >4 with ileus), Mg 2.2 (goal >2 with ileus), Phos 3, Corr Ca 9.2 (alb 2)  Renal: ESRD, new start HD this admission, remains on CRRT  Pulm: Intubated. fio2 40%. SBT today.  Cards: Requiring NE at 2 mcg/min. HR ok.  Hepatobil: LFTs elevated but trending down.   Neuro: New stroke noted on 3/18. Off sedation - RASS -1.  ID: Abx for PNA.   Best Practices: Lovenox, PPI IV  TPN Access: CVC  TPN day#: 1  Plan:  Continue Clinimix 5/15 (NO electrolytes) and increase to 83 ml/hr which will meet 100% calorie goals and 83% protein goals. Will  continue with NO IVFE for first 7 days in ICU patient according to SCCM/ASPEN guidelines -Decrease MIVF to 40 ml/hr and further dosing adjustments per renal -empiric SSI -TPN labs ordered -standard MVI and trace elements.   Christoper Fabian, PharmD, BCPS Clinical pharmacist, pager [phone] 07/03/2014 7:48 AM

## 2014-07-03 NOTE — Progress Notes (Signed)
ANTIBIOTIC CONSULT NOTE - FOLLOW UP  Pharmacy Consult for vancomycin/zosyn/fluconazole Indication: PNA  No Known Allergies  Patient Measurements: Height:  (165.1 cm) Weight: 212 lb 8.4 oz (96.4 kg) IBW/kg (Calculated) : 57  Vital Signs: Temp: 97.7 F (36.5 C) (04/01 1300) Temp Source: Core (Comment) (04/01 1200) Pulse Rate: 79 (04/01 1300) Intake/Output from previous day: 03/31 0701 - 04/01 0700 In: 4115.2 [I.V.:3045.2; IV Piggyback:600; TPN:470] Out: 3975 [Urine:10; Drains:110] Intake/Output from this shift: Total I/O In: 573.1 [I.V.:173.1; IV Piggyback:160; TPN:240] Out: 754 [Urine:5; Drains:20; Other:729]  Labs:  Recent Labs  07/01/14 1813 07/02/14 0430 07/02/14 1600 07/03/14 0445  WBC 11.8*  12.0* 13.6*  --  13.8*  HGB 10.5*  10.6* 11.1*  --  10.0*  PLT 125*  108* 116*  --  69*  CREATININE 5.17*  5.13* 3.33* 2.71* 2.15*   Estimated Creatinine Clearance: 32.8 mL/min (by C-G formula based on Cr of 2.15). No results for input(s): VANCOTROUGH, VANCOPEAK, VANCORANDOM, GENTTROUGH, GENTPEAK, GENTRANDOM, TOBRATROUGH, TOBRAPEAK, TOBRARND, AMIKACINPEAK, AMIKACINTROU, AMIKACIN in the last 72 hours.   Microbiology: Recent Results (from the past 720 hour(s))  MRSA PCR Screening     Status: None   Collection Time: 06/17/14  9:29 PM  Result Value Ref Range Status   MRSA by PCR NEGATIVE NEGATIVE Final    Comment:        The GeneXpert MRSA Assay (FDA approved for NASAL specimens only), is one component of a comprehensive MRSA colonization surveillance program. It is not intended to diagnose MRSA infection nor to guide or monitor treatment for MRSA infections.   Clostridium Difficile by PCR     Status: None   Collection Time: 06/18/14  3:52 AM  Result Value Ref Range Status   C difficile by pcr NEGATIVE NEGATIVE Final  Clostridium Difficile by PCR     Status: None   Collection Time: 06/27/14  7:40 PM  Result Value Ref Range Status   C difficile by pcr  NEGATIVE NEGATIVE Final  Culture, blood (routine x 2)     Status: None (Preliminary result)   Collection Time: 06/29/14  8:10 AM  Result Value Ref Range Status   Specimen Description BLOOD RIGHT ANTECUBITAL  Final   Special Requests BOTTLES DRAWN AEROBIC ONLY 3CC  Final   Culture   Final           BLOOD CULTURE RECEIVED NO GROWTH TO DATE CULTURE WILL BE HELD FOR 5 DAYS BEFORE ISSUING A FINAL NEGATIVE REPORT Performed at [facility]    Report Status PENDING  Incomplete  Culture, blood (routine x 2)     Status: None (Preliminary result)   Collection Time: 06/29/14  8:15 AM  Result Value Ref Range Status   Specimen Description BLOOD RIGHT HAND  Final   Special Requests BOTTLES DRAWN AEROBIC ONLY 2CC  Final   Culture   Final           BLOOD CULTURE RECEIVED NO GROWTH TO DATE CULTURE WILL BE HELD FOR 5 DAYS BEFORE ISSUING A FINAL NEGATIVE REPORT Note: Culture results may be compromised due to an inadequate volume of blood received in culture bottles. Performed at [facility]    Report Status PENDING  Incomplete  MRSA PCR Screening     Status: None   Collection Time: 07/01/14  2:49 PM  Result Value Ref Range Status   MRSA by PCR NEGATIVE NEGATIVE Final    Comment:        The GeneXpert MRSA Assay (FDA  approved for NASAL specimens only), is one component of a comprehensive MRSA colonization surveillance program. It is not intended to diagnose MRSA infection nor to guide or monitor treatment for MRSA infections.   Anaerobic culture     Status: None (Preliminary result)   Collection Time: 07/01/14  4:18 PM  Result Value Ref Range Status   Specimen Description FLUID PERITONEAL  Final   Special Requests ON SWAB  Final   Gram Stain   Final    RARE WBC PRESENT, PREDOMINANTLY MONONUCLEAR NO ORGANISMS SEEN Performed at [facility]    Culture NO GROWTH Performed at [facility]   Final   Report Status PENDING  Incomplete  Body fluid culture      Status: None (Preliminary result)   Collection Time: 07/01/14  4:18 PM  Result Value Ref Range Status   Specimen Description FLUID PERITONEAL  Final   Special Requests ON SWAB  Final   Gram Stain   Final    RARE WBC PRESENT, PREDOMINANTLY MONONUCLEAR NO ORGANISMS SEEN Performed at [facility]    Culture NO GROWTH Performed at [facility]   Final   Report Status PENDING  Incomplete    Anti-infectives    Start     Dose/Rate Route Frequency Ordered Stop   07/02/14 1800  vancomycin (VANCOCIN) IVPB 1000 mg/200 mL premix     1,000 mg 200 mL/hr over 60 Minutes Intravenous Every 24 hours 07/01/14 1858     07/01/14 2200  piperacillin-tazobactam (ZOSYN) IVPB 3.375 g     3.375 g 100 mL/hr over 30 Minutes Intravenous 4 times per day 07/01/14 1858     07/01/14 2000  fluconazole (DIFLUCAN) IVPB 400 mg     400 mg 100 mL/hr over 120 Minutes Intravenous Every 24 hours 07/01/14 1858     07/01/14 1400  fluconazole (DIFLUCAN) IVPB 200 mg  Status:  Discontinued     200 mg 100 mL/hr over 60 Minutes Intravenous Every 24 hours 07/01/14 1330 07/01/14 1853   07/01/14 1000  piperacillin-tazobactam (ZOSYN) IVPB 2.25 g  Status:  Discontinued     2.25 g 100 mL/hr over 30 Minutes Intravenous Every 8 hours 07/01/14 0952 07/01/14 1853   07/01/14 1000  vancomycin (VANCOCIN) 500 mg in sodium chloride 0.9 % 100 mL IVPB     500 mg 100 mL/hr over 60 Minutes Intravenous  Once 07/01/14 0952 07/01/14 1126   06/30/14 1200  vancomycin (VANCOCIN) IVPB 1000 mg/200 mL premix     1,000 mg 200 mL/hr over 60 Minutes Intravenous  Once 06/30/14 0944 06/30/14 1403   06/30/14 1200  ceFEPIme (MAXIPIME) 2 g in dextrose 5 % 50 mL IVPB     2 g 100 mL/hr over 30 Minutes Intravenous  Once 06/30/14 0944 06/30/14 1425   06/30/14 0100  vancomycin (VANCOCIN) 2,000 mg in sodium chloride 0.9 % 500 mL IVPB     2,000 mg 250 mL/hr over 120 Minutes Intravenous  Once 06/30/14 0048 06/30/14 0525   06/30/14 0100  ceFEPIme  (MAXIPIME) 2 g in dextrose 5 % 50 mL IVPB     2 g 100 mL/hr over 30 Minutes Intravenous  Once 06/30/14 0048 06/30/14 0322   06/27/14 0600  cefUROXime (ZINACEF) 1.5 g in dextrose 5 % 50 mL IVPB     1.5 g 100 mL/hr over 30 Minutes Intravenous On call to O.R. 06/26/14 1019 06/27/14 0630   06/19/14 1800  oseltamivir (TAMIFLU) capsule 30 mg     30 mg Oral  Every M-W-F (1800) 06/19/14 1003 06/22/14 1841   06/18/14 1600  oseltamivir (TAMIFLU) capsule 30 mg  Status:  Discontinued     30 mg Oral Daily 06/18/14 1538 06/19/14 1003      Assessment: 59 yo female on vancomycin/zosyn and fluconazole for possible PNA and peritonitis. She is now s/p Ex-lap with evacuation of ascites, closure of perforated pyloric ulcer and resection of ileum with primary anatomosis (noted on TPN). WBC= 13., SCr= 2.15 (trend down) and CRRT was started 3/30.  Vancomycin 3/29> Cefepime 3/29>3/30 Zosyn 3/30> 3/20 fluconazole  3/30 peritoneal fluid- ngtd 3/28 blood x2 3/36 c diff - neg  Goal of Therapy:  Vancomycin trough level 15-20 mcg/ml  Plan:  -No antibiotic dose changes needed -Will consider a vancomycin trough soon -Will watch patient progress on CRRT -Will follow  cultures and clinical progress  Harland Germanndrew Lesean Woolverton, Pharm D 07/03/2014 1:59 PM

## 2014-07-03 NOTE — Progress Notes (Addendum)
2 Days Post-Op  Subjective:  Remains ventilator dependent, but apparently weaning is contemplated today . Opens eyes to voice. Squeezes right hand to command.   still on a little levaphed for pressor support   BP 107/57. Heart rate 79. Afebrile. Morning labs pending. Potassium last night 4.3. ABG looks better. Acidosis has resolved.  oliguric. On CVVH.  Cultures from peritoneal fluid negative thus far. She still needs to be covered with antibiotics, regardless.  Objective: Vital signs in last 24 hours: Temp:  [96.4 F (35.8 C)-99.1 F (37.3 C)] 96.6 F (35.9 C) (04/01 0300) Pulse Rate:  [70-98] 79 (04/01 0300) Resp:  [20] 20 (03/31 1502) SpO2:  [96 %-100 %] 99 % (04/01 0300) Arterial Line BP: (88-145)/(56-83) 107/57 mmHg (04/01 0300) FiO2 (%):  [40 %-60 %] 40 % (04/01 0410) Weight:  [96.4 kg (212 lb 8.4 oz)] 96.4 kg (212 lb 8.4 oz) (04/01 0447) Last BM Date: 06/28/14  Intake/Output from previous day: 03/31 0701 - 04/01 0700 In: 3558.8 [I.V.:2658.8; IV Piggyback:550; TPN:350] Out: 3580 [Urine:10; Drains:110] Intake/Output this shift: Total I/O In: 1601.3 [I.V.:1031.3; IV Piggyback:250; TPN:320] Out: 1665 [Drains:30; Other:1635]  General appearance: Sedated but arousable. Ventilator dependent. Resp: Coarse breath sounds bilaterally GI: Obese. Soft. Midline wound dressing taken down. Fascia intact. Old blood on bandage but no active bleeding. Repacked. JP drainage serous.  Lab Results:  Results for orders placed or performed during the hospital encounter of 06/17/14 (from the past 24 hour(s))  Renal function panel (daily at 1600)     Status: Abnormal   Collection Time: 07/02/14  4:00 PM  Result Value Ref Range   Sodium 137 135 - 145 mmol/L   Potassium 4.3 3.5 - 5.1 mmol/L   Chloride 103 96 - 112 mmol/L   CO2 25 19 - 32 mmol/L   Glucose, Bld 102 (H) 70 - 99 mg/dL   BUN 25 (H) 6 - 23 mg/dL   Creatinine, Ser 3.872.71 (H) 0.50 - 1.10 mg/dL   Calcium 8.2 (L) 8.4 - 10.5 mg/dL    Phosphorus 4.1 2.3 - 4.6 mg/dL   Albumin 2.2 (L) 3.5 - 5.2 g/dL   GFR calc non Af Amer 18 (L) >90 mL/min   GFR calc Af Amer 21 (L) >90 mL/min   Anion gap 9 5 - 15  Glucose, capillary     Status: Abnormal   Collection Time: 07/02/14  8:55 PM  Result Value Ref Range   Glucose-Capillary 157 (H) 70 - 99 mg/dL   Comment 1 Capillary Specimen   Glucose, capillary     Status: Abnormal   Collection Time: 07/02/14 11:45 PM  Result Value Ref Range   Glucose-Capillary 158 (H) 70 - 99 mg/dL   Comment 1 Arterial Specimen   Blood gas, arterial     Status: Abnormal   Collection Time: 07/03/14  3:05 AM  Result Value Ref Range   FIO2 0.40 %   Delivery systems VENTILATOR    Mode PRESSURE REGULATED VOLUME CONTROL    VT 450 mL   Rate 20 resp/min   Peep/cpap 5.0 cm H20   pH, Arterial 7.370 7.350 - 7.450   pCO2 arterial 45.7 (H) 35.0 - 45.0 mmHg   pO2, Arterial 100.0 80.0 - 100.0 mmHg   Bicarbonate 25.8 (H) 20.0 - 24.0 mEq/L   TCO2 27.2 0 - 100 mmol/L   Acid-Base Excess 1.1 0.0 - 2.0 mmol/L   O2 Saturation 96.9 %   Patient temperature 98.6    Collection site A-LINE  Drawn by [phone]    Sample type ARTERIAL    Allens test (pass/fail) NOT INDICATED (A) PASS  Glucose, capillary     Status: Abnormal   Collection Time: 07/03/14  4:41 AM  Result Value Ref Range   Glucose-Capillary 155 (H) 70 - 99 mg/dL   Comment 1 Arterial Specimen      Studies/Results: Dg Chest Portable 1 View  07/02/2014   CLINICAL DATA:  Atelectasis.  EXAM: PORTABLE CHEST - 1 VIEW  COMPARISON:  None.  FINDINGS: Interim intubation with endotracheal tube tip 5.2 cm above the carina. Interim placement of NG tube, its tip is projected below left hemidiaphragm. Right dialysis catheter, left IJ catheter in stable position.Stable cardiomegaly. No pulmonary venous congestion. Persistent left lower lobe atelectasis and infiltrate with possible small left pleural effusion. No pneumothorax.  IMPRESSION: 1. Interim intubation. Its tip is 5.2  cm above the carina. Interim NG tube placement. Its tip is below the left hemidiaphragm. 2. Persistent left lower lobe atelectasis and infiltrate. Small left pleural effusion cannot be excluded. 3. Stable cardiomegaly.   Electronically Signed   By: Maisie Fus  Register   On: 07/02/2014 07:14    . antiseptic oral rinse  7 mL Mouth Rinse Q2H  . chlorhexidine  15 mL Mouth Rinse BID  . dextrose  1 ampule Intravenous Once  . enoxaparin (LOVENOX) injection  30 mg Subcutaneous Q24H  . ferric gluconate (FERRLECIT/NULECIT) IV  125 mg Intravenous Q M,W,F-HD  . fluconazole (DIFLUCAN) IV  400 mg Intravenous Q24H  . hydrocortisone sod succinate (SOLU-CORTEF) inj  50 mg Intravenous Q6H  . insulin aspart  0-9 Units Subcutaneous 6 times per day  . ipratropium  0.5 mg Nebulization Q6H  . levalbuterol  0.63 mg Nebulization Q6H  . pantoprazole (PROTONIX) IV  40 mg Intravenous Q12H  . piperacillin-tazobactam  3.375 g Intravenous 4 times per day  . vancomycin  1,000 mg Intravenous Q24H     Assessment/Plan: s/p Procedure(s): EXPLORATORY LAPAROTOMY WITH CLOSURE OF PERFORATED PYLORIC ULCER SMALL BOWEL RESECTION  POD #2. Closure perforated pyloric ulcer. Small bowel resection for ischemic necrosis. Diffuse peritonitis Continue triple antibiotics Nothing by mouth continue TPN Daily wound care Protonix BID  Steroids. High-dose steroids probably are a  contributive factor to her perforated ulcer,and  will have a direct adverse effect on wound healing and anastomotic healing. I would ask CCM to address the need for the dose and wean as rapidly as possible if feasible.  ESRD-CVVH-  OK to remove foley if CCM desires COPD Recent CVA--right ACA Acute encephalopathy.   @  LOS: 16 days    [name] M 07/03/2014  . .prob

## 2014-07-04 ENCOUNTER — Inpatient Hospital Stay (HOSPITAL_COMMUNITY): Payer: Medicaid Other

## 2014-07-04 LAB — GLUCOSE, CAPILLARY
GLUCOSE-CAPILLARY: 180 mg/dL — AB (ref 70–99)
GLUCOSE-CAPILLARY: 192 mg/dL — AB (ref 70–99)
GLUCOSE-CAPILLARY: 210 mg/dL — AB (ref 70–99)
GLUCOSE-CAPILLARY: 228 mg/dL — AB (ref 70–99)
Glucose-Capillary: 237 mg/dL — ABNORMAL HIGH (ref 70–99)
Glucose-Capillary: 148 mg/dL — ABNORMAL HIGH (ref 70–99)

## 2014-07-04 LAB — CBC
HEMATOCRIT: 26.9 % — AB (ref 36.0–46.0)
HEMOGLOBIN: 8.9 g/dL — AB (ref 12.0–15.0)
MCH: 26.3 pg (ref 26.0–34.0)
MCHC: 33.1 g/dL (ref 30.0–36.0)
MCV: 79.6 fL (ref 78.0–100.0)
Platelets: 48 10*3/uL — ABNORMAL LOW (ref 150–400)
RBC: 3.38 MIL/uL — ABNORMAL LOW (ref 3.87–5.11)
RDW: 19.4 % — AB (ref 11.5–15.5)
WBC: 14.2 10*3/uL — ABNORMAL HIGH (ref 4.0–10.5)

## 2014-07-04 LAB — APTT: APTT: 36 s (ref 24–37)

## 2014-07-04 LAB — BODY FLUID CULTURE

## 2014-07-04 LAB — BASIC METABOLIC PANEL
Anion gap: 5 (ref 5–15)
BUN: 23 mg/dL (ref 6–23)
CHLORIDE: 102 mmol/L (ref 96–112)
CO2: 27 mmol/L (ref 19–32)
Calcium: 7.6 mg/dL — ABNORMAL LOW (ref 8.4–10.5)
Creatinine, Ser: 1.83 mg/dL — ABNORMAL HIGH (ref 0.50–1.10)
GFR calc non Af Amer: 29 mL/min — ABNORMAL LOW (ref >90)
GFR, EST AFRICAN AMERICAN: 34 mL/min — AB (ref >90)
Glucose, Bld: 201 mg/dL — ABNORMAL HIGH (ref 70–99)
POTASSIUM: 3.8 mmol/L (ref 3.5–5.1)
Sodium: 134 mmol/L — ABNORMAL LOW (ref 135–145)

## 2014-07-04 LAB — PHOSPHORUS: PHOSPHORUS: 1.6 mg/dL — AB (ref 2.3–4.6)

## 2014-07-04 LAB — MAGNESIUM: Magnesium: 2.3 mg/dL (ref 1.5–2.5)

## 2014-07-04 MED ORDER — DARBEPOETIN ALFA 100 MCG/0.5ML IJ SOSY
100.0000 ug | PREFILLED_SYRINGE | INTRAMUSCULAR | Status: DC
  Administered 2014-07-04: 100 ug via INTRAVENOUS
  Filled 2014-07-04 (×2): qty 0.5

## 2014-07-04 MED ORDER — TRACE MINERALS CR-CU-F-FE-I-MN-MO-SE-ZN IV SOLN
INTRAVENOUS | Status: AC
  Administered 2014-07-04: 17:00:00 via INTRAVENOUS
  Filled 2014-07-04: qty 1992

## 2014-07-04 MED ORDER — HYDROCORTISONE NA SUCCINATE PF 100 MG IJ SOLR
50.0000 mg | Freq: Two times a day (BID) | INTRAMUSCULAR | Status: DC
  Filled 2014-07-04 (×2): qty 1

## 2014-07-04 MED ORDER — SODIUM PHOSPHATE 3 MMOLE/ML IV SOLN
20.0000 mmol | Freq: Once | INTRAVENOUS | Status: AC
  Administered 2014-07-04: 20 mmol via INTRAVENOUS
  Filled 2014-07-04: qty 6.67

## 2014-07-04 MED ORDER — DEXTROSE 50 % IV SOLN
INTRAVENOUS | Status: AC
  Filled 2014-07-04: qty 50

## 2014-07-04 NOTE — Progress Notes (Signed)
3 Days Post-Op  Subjective: No sig events. Off levo  Objective: Vital signs in last 24 hours: Temp:  [97.2 F (36.2 C)-98.6 F (37 C)] 97.2 F (36.2 C) (04/02 0700) Pulse Rate:  [73-101] 89 (04/02 0803) Resp:  [14-20] 14 (04/02 0803) BP: (122)/(69) 122/69 mmHg (04/02 0803) SpO2:  [98 %-100 %] 100 % (04/02 0803) Arterial Line BP: (93-144)/(49-75) 111/62 mmHg (04/02 0700) FiO2 (%):  [40 %] 40 % (04/02 0803) Weight:  [95.5 kg (210 lb 8.6 oz)] 95.5 kg (210 lb 8.6 oz) (04/02 0447) Last BM Date: 07/29/14  Intake/Output from previous day: 04/01 0701 - 04/02 0700 In: 2604.7 [I.V.:356.9; IV Piggyback:710; TPN:1537.9] Out: 2867 [Urine:55; Drains:35] Intake/Output this shift: Total I/O In: -  Out: 89 [Other:89]  Resting comfortably. oe to voice.  Soft.obese, nontender. Drain -serosang  Lab Results:   Recent Labs  07/03/14 0445 07/04/14 0510  WBC 13.8* 14.2*  HGB 10.0* 8.9*  HCT 31.3* 26.9*  PLT 69* 48*   BMET  Recent Labs  07/03/14 1555 07/04/14 0510  NA 136 134*  K 3.8 3.8  CL 102 102  CO2 25 27  GLUCOSE 158* 201*  BUN 19 23  CREATININE 1.96* 1.83*  CALCIUM 7.8* 7.6*   PT/INR  Recent Labs  07/01/14 1813 07/02/14 0430  LABPROT 21.4* 18.3*  INR 1.83* 1.51*   ABG  Recent Labs  07/01/14 2130 07/03/14 0305  PHART 7.300* 7.370  HCO3 24.7* 25.8*    Studies/Results: Dg Chest Port 1 View  07/04/2014   CLINICAL DATA:  Respiratory failure  EXAM: PORTABLE CHEST - 1 VIEW  COMPARISON:  07/03/2014  FINDINGS: Cardiac shadow is stable. A left jugular central line, endotracheal tube, nasogastric catheter and right-sided dialysis catheter are again seen and stable. Improved aeration is noted in the left lung base. No new focal abnormality is noted.  IMPRESSION: Improving infiltrate in the left base. No new focal abnormality is seen.   Electronically Signed   By: Alcide CleverMark  Lukens M.D.   On: 07/04/2014 08:10   Dg Chest Port 1 View  07/03/2014   CLINICAL DATA:  Intubation .   EXAM: PORTABLE CHEST - 1 VIEW  COMPARISON:  07/02/2014.  FINDINGS: Endotracheal tube and NG tube in stable position. Left IJ line and right IJ dialysis catheter stable position. Patient rotated to the left. Heart size stable. Pulmonary vascularity normal . Persistent left lower lobe atelectasis and infiltrate. Small left pleural effusion cannot be excluded. No pneumothorax. No acute osseous abnormality .  IMPRESSION: 1. Lines and tubes in stable position. 2. Persistent dense left lower lobe atelectasis and infiltrate. No interim change. Small left pleural effusion cannot be excluded .   Electronically Signed   By: Maisie Fushomas  Register   On: 07/03/2014 07:35    Anti-infectives: Anti-infectives    Start     Dose/Rate Route Frequency Ordered Stop   07/02/14 1800  vancomycin (VANCOCIN) IVPB 1000 mg/200 mL premix     1,000 mg 200 mL/hr over 60 Minutes Intravenous Every 24 hours 07/01/14 1858     07/01/14 2200  piperacillin-tazobactam (ZOSYN) IVPB 3.375 g     3.375 g 100 mL/hr over 30 Minutes Intravenous 4 times per day 07/01/14 1858     07/01/14 2000  fluconazole (DIFLUCAN) IVPB 400 mg     400 mg 100 mL/hr over 120 Minutes Intravenous Every 24 hours 07/01/14 1858     07/01/14 1400  fluconazole (DIFLUCAN) IVPB 200 mg  Status:  Discontinued     200 mg  100 mL/hr over 60 Minutes Intravenous Every 24 hours 07/01/14 1330 07/01/14 1853   07/01/14 1000  piperacillin-tazobactam (ZOSYN) IVPB 2.25 g  Status:  Discontinued     2.25 g 100 mL/hr over 30 Minutes Intravenous Every 8 hours 07/01/14 0952 07/01/14 1853   07/01/14 1000  vancomycin (VANCOCIN) 500 mg in sodium chloride 0.9 % 100 mL IVPB     500 mg 100 mL/hr over 60 Minutes Intravenous  Once 07/01/14 0952 07/01/14 1126   06/30/14 1200  vancomycin (VANCOCIN) IVPB 1000 mg/200 mL premix     1,000 mg 200 mL/hr over 60 Minutes Intravenous  Once 06/30/14 0944 06/30/14 1403   06/30/14 1200  ceFEPIme (MAXIPIME) 2 g in dextrose 5 % 50 mL IVPB     2 g 100 mL/hr  over 30 Minutes Intravenous  Once 06/30/14 0944 06/30/14 1425   06/30/14 0100  vancomycin (VANCOCIN) 2,000 mg in sodium chloride 0.9 % 500 mL IVPB     2,000 mg 250 mL/hr over 120 Minutes Intravenous  Once 06/30/14 0048 06/30/14 0525   06/30/14 0100  ceFEPIme (MAXIPIME) 2 g in dextrose 5 % 50 mL IVPB     2 g 100 mL/hr over 30 Minutes Intravenous  Once 06/30/14 0048 06/30/14 0322   06/27/14 0600  cefUROXime (ZINACEF) 1.5 g in dextrose 5 % 50 mL IVPB     1.5 g 100 mL/hr over 30 Minutes Intravenous On call to O.R. 06/26/14 1019 06/27/14 0630   06/19/14 1800  oseltamivir (TAMIFLU) capsule 30 mg     30 mg Oral Every M-W-F (1800) 06/19/14 1003 06/22/14 1841   06/18/14 1600  oseltamivir (TAMIFLU) capsule 30 mg  Status:  Discontinued     30 mg Oral Daily 06/18/14 1538 06/19/14 1003      Assessment/Plan: s/p Procedure(s): EXPLORATORY LAPAROTOMY WITH CLOSURE OF PERFORATED PYLORIC ULCER (N/A) SMALL BOWEL RESECTION (N/A)  Cont bowel rest, NG to LIWS Wean steroids as fast as possible please Cont IV abx for perforation; Cultures NTD thromobcytopenia per CCM TPN  Mary Sella. Andrey Campanile, MD, FACS General, Bariatric, & Minimally Invasive Surgery [facility], [place]   LOS: 17 days    Atilano Ina 07/04/2014

## 2014-07-04 NOTE — Progress Notes (Signed)
PARENTERAL NUTRITION CONSULT NOTE - Follow-up  Pharmacy Consult for TPN Indication: prolonged ileus  No Known Allergies  Patient Measurements: Height: 5\' 5"  (165.1 cm) Weight: 210 lb 8.6 oz (95.5 kg) IBW/kg (Calculated) : 57  Adjusted body weight: 67  Vital Signs: Temp: 97.2 F (36.2 C) (04/02 0700) Pulse Rate: 74 (04/02 0700) Intake/Output from previous day: 04/01 0701 - 04/02 0700 In: 2604.7 [I.V.:356.9; IV Piggyback:710; TPN:1537.9] Out: 2867 [Urine:55; Drains:35] Intake/Output from this shift:    Labs:  Recent Labs  07/01/14 1445  07/01/14 1813 07/02/14 0430 07/03/14 0445 07/04/14 0510  WBC 20.2*  --  11.8*  12.0* 13.6* 13.8* 14.2*  HGB 13.4  < > 10.5*  10.6* 11.1* 10.0* 8.9*  HCT 41.7  < > 32.9*  32.7* 35.1* 31.3* 26.9*  PLT 154  --  125*  108* 116* 69* 48*  APTT  --   --   --  35 38* 36  INR 1.44  --  1.83* 1.51*  --   --   < > = values in this interval not displayed.   Recent Labs  07/01/14 1813 07/02/14 0430 07/02/14 1600 07/03/14 0445 07/03/14 1555  NA 140  139 137 137 137 136  K 4.7  4.7 4.5 4.3 3.7 3.8  CL 102  102 103 103 104 102  CO2 22  22 23 25 29 25   GLUCOSE 145*  144* 128* 102* 158* 158*  BUN 55*  54* 34* 25* 21 19  CREATININE 5.17*  5.13* 3.33* 2.71* 2.15* 1.96*  CALCIUM 8.0*  8.0* 8.1* 8.2* 7.6* 7.8*  MG 2.0 2.1  --  2.2  --   PHOS 9.8*  9.7* 5.8* 4.1 3.0 2.0*  PROT 4.5* 5.2*  --  5.3*  --   ALBUMIN 2.3*  2.2* 2.4* 2.2* 2.0* 1.9*  AST 1352* 757*  --  231*  --   ALT 414* 455*  --  302*  --   ALKPHOS 59 65  --  78  --   BILITOT 2.2* 2.8*  --  1.8*  --   TRIG 49  --   --  113  --    Estimated Creatinine Clearance: 35.8 mL/min (by C-G formula based on Cr of 1.96).    Recent Labs  07/03/14 1943 07/04/14 0006 07/04/14 0355  GLUCAP 181* 180* 228*    Insulin Requirements in the past 24 hours:  9 units Novolog SSI  Current Nutrition:  NPO Clinimix 5/15 at 1983ml/hr provides 100 gm protein and 1414  kcal   Nutritional Goals: per RD 3/31 1400 kCal, 120-140 grams of protein per day  Assessment: 59 yo F with complicated hospital course. S/p closure perforated pyloric ulcer, SBR for ischemic necrosis, diffuse peritonitis 3/30. Pharmacy consulted to provide TPN for nutrition support.   GI: NPO. TPN. Prealbumin pending.  Endo: CBGs elevated at goal 158-228, tapering stress dose steroids  Lytes: K 3.8 (goal >4 with ileus), Mg 2.3 (goal >2 with ileus), Phos 1.6 (MD replacing with 20mmol NaPhos), Corr Ca 9.3 (alb 1.9)  Renal: ESRD, new start HD this admission, remains on CRRT but if filter clots nephrology plans to transition to IHD. I/O neg 0.3L past 24 hours.  Pulm: Intubated. fio2 40%. SBT today.  Cards: Off pressors. BP soft. HR ok.  Hepatobil: LFTs elevated but trending down. TG 113.  Neuro: New stroke noted on 3/18. Off sedation. RASS -1. CPOT 0-4.  ID: vanc/zosyn/diflucan for possible PNA and peritonitis.  Best Practices: Lovenox, PPI IV  TPN Access: CVC  TPN day#: 2  Plan:  - Continue Clinimix 5/15 (NO electrolytes) at 83 ml/hr which will meet 100% calorie goals and 83% protein goals. It will be impossible to meet high protein and low kcal goals utilizing pre-made TPN but will mazimize protein and minimize calories as much as possible) - Will continue with NO IVFE for first 7 days in ICU patient according to SCCM/ASPEN guidelines.  - Continue empiric SSI, will also add small amount of regular insulin 10 units to TPN - Standard MVI and trace elements daily in TPN - TPN labs ordered  Christoper Fabian, PharmD, BCPS Clinical pharmacist, pager [phone] 07/04/2014 7:29 AM

## 2014-07-04 NOTE — Procedures (Signed)
Extubation Procedure Note  Patient Details:   Name: [name] DOB: 1955/04/28 MRN: [id number]   Airway Documentation:     Evaluation  O2 sats: stable throughout Complications: No apparent complications Patient did tolerate procedure well. Bilateral Breath Sounds: Clear Suctioning: Airway Yes   Patient extubated to 2L nasal cannula per MD order.  Positive cuff leak noted.  No evidence of stridor.  Patient able to speak post extubation.  Sats currently 97%.  Vitals are stable.  Incentive spirometry performed with achieved goal of 200.  No apparent complications.  Ledell NossBrown, Lawerance Matsuo N 07/04/2014, 12:02 PM

## 2014-07-04 NOTE — Progress Notes (Signed)
[facility] Progress Note  Assessment/Plan: 1. Perforated pyloric ulcer and ischemic bowel-s/p surgery on 3/30- now on vanc and zosyn.  Ischemic bowel could have been embolic this admit.  No heparin with CRRT   2. CVA/cryptogenic stroke - acute ACA, left hemiparesis; neuro following - 3. ESRD - new start this admission (sister [facility], [facility] HD [facility])- problems with AVF 3/25 - had fistulagram 3/21 without intervention - showed focal aneurysmal dilitation and competing venous outflow in the deep venous system -VVS wanting to wait for her to recover before revision of AVF is considered -  - right IJ PC placed 3/26 - - catheter poorly functional  Initially but now better-using it now for CRRT given hemodynamic instability.  Will start to pull a little volume with CRRT but if clots would not restart- look to transition to IHD 4. Anemia - Hgb previously good- CKD plus surgery- has dropped- add ESA  5. Secondary hyperparathyroidism - iPTH 338 - 3/21, 531 3/18 - started hectorol 1- also on phoslo but not eating 6. HTN/volume -  Will start to pull a little volume given high CVP and vent dependence  7. Nutrition - alb 2.6 + vit- surgery will not help that  8. COPD/tobacco - encouraged smoking cessation. 9. S/p tx for Infuenza A/HIN1 10. Severe R HF per TEE  11. Renal mass per [place] 3/16 - "Indeterminate 1.9 cm hypoechoic mass off the interpolar region of the left kidney. This may potentially represent a cyst however solid mass is not excluded. Recommend follow-up in the outpatient setting with eitherpre and post enhanced CT or MRI." 12. DM - per primary BS 70 - 120 14. Increased WBC- was peritonitis- is correcting 15.  Dispo- certainly seems like patient has not thrived well with this whole thing. Will need to see how she recovers from this latest set back 16- hypophos- will replete    Aaditya Letizia A   07/04/2014, 7:34 AM    Subjective:  Stable overnight, pressors  off- she is alert  Objective Filed Vitals:   07/04/14 0447 07/04/14 0500 07/04/14 0600 07/04/14 0700  BP:      Pulse:  88 76 74  Temp:  97.3 F (36.3 C) 97.2 F (36.2 C) 97.2 F (36.2 C)  TempSrc:      Resp:      Height:      Weight: 95.5 kg (210 lb 8.6 oz)     SpO2:  100% 99% 100%   Physical Exam General: sedated, intubated Heart: RRR Lungs: coarse BS clear some with cough Abdomen: soft + BS, epigastric tenderness -no rebound/guarding Extremities:1+ LE edema Dialysis Access: new right IJ and left upper AVF + bruit - bruising  Dialysis Orders: none - new start to HD  Additional Objective Labs: Basic Metabolic Panel:  Recent Labs Lab 07/02/14 1600 07/03/14 0445 07/03/14 1555  NA 137 137 136  K 4.3 3.7 3.8  CL 103 104 102  CO2 GLUCOSE 102* 158* 158*  BUN 25* 21 19  CREATININE 2.71* 2.15* 1.96*  CALCIUM 8.2* 7.6* 7.8*  PHOS 4.1 3.0 2.0*   Liver Function Tests:  Recent Labs Lab 07/01/14 1813 07/02/14 0430 07/02/14 1600 07/03/14 0445 07/03/14 1555  AST 1352* 757*  --  231*  --   ALT 414* 455*  --  302*  --   ALKPHOS 59 65  --  78  --   BILITOT 2.2* 2.8*  --  1.8*  --  PROT 4.5* 5.2*  --  5.3*  --   ALBUMIN 2.3*  2.2* 2.4* 2.2* 2.0* 1.9*   CBC:  Recent Labs Lab 07/01/14 1445  07/01/14 1813 07/02/14 0430 07/03/14 0445 07/04/14 0510  WBC 20.2*  --  11.8*  12.0* 13.6* 13.8* 14.2*  NEUTROABS 17.8*  --  8.6* 11.4*  --   --   HGB 13.4  < > 10.5*  10.6* 11.1* 10.0* 8.9*  HCT 41.7  < > 32.9*  32.7* 35.1* 31.3* 26.9*  MCV 82.6  --  82.5  81.5 81.3 81.5 79.6  PLT 154  --  125*  108* 116* 69* 48*  < > = values in this interval not displayed. CBG:  Recent Labs Lab 07/03/14 1144 07/03/14 1556 07/03/14 1943 07/04/14 0006 07/04/14 0355  GLUCAP 140* 144* 181* 180* 228*  Medications: . sodium chloride 10 mL/hr at 07/03/14 0900  . norepinephrine (LEVOPHED) Adult infusion 2 mcg/min (07/04/14 0127)  . dialysis replacement fluid  (prismasate) 350 mL/hr at 07/04/14 0532  . dialysis replacement fluid (prismasate) 350 mL/hr at 07/04/14 0532  . dialysate (PRISMASATE) 1,500 mL/hr at 07/04/14 0447  . TPN (CLINIMIX) Adult without lytes 83 mL/hr at 07/03/14 1730   . antiseptic oral rinse  7 mL Mouth Rinse Q2H  . chlorhexidine  15 mL Mouth Rinse BID  . dextrose  1 ampule Intravenous Once  . ferric gluconate (FERRLECIT/NULECIT) IV  125 mg Intravenous Q M,W,F-HD  . fluconazole (DIFLUCAN) IV  400 mg Intravenous Q24H  . hydrocortisone sod succinate (SOLU-CORTEF) inj  50 mg Intravenous Q6H  . insulin aspart  0-9 Units Subcutaneous 6 times per day  . ipratropium  0.5 mg Nebulization Q6H  . levalbuterol  0.63 mg Nebulization Q6H  . pantoprazole (PROTONIX) IV  40 mg Intravenous Q12H  . piperacillin-tazobactam  3.375 g Intravenous 4 times per day  . vancomycin  1,000 mg Intravenous Q24H

## 2014-07-04 NOTE — Progress Notes (Signed)
Name: [name] MRN: [id number] DOB: [birth year]    ADMISSION DATE:  06/17/2014 CONSULTATION DATE:  3/30  REFERRING MD :  Mahala Menghini   CHIEF COMPLAINT:  Hypoxia   BRIEF PATIENT DESCRIPTION: 59 yo female smoker with hx HTN, ESRD on HD (just began this admit), COPD, failed L AV fistula, hx COPD, initially admitted 3/16 with flu, acute hypoxic resp failure.  Ultimately tx to [facility] for worsening metabolic parameters and need for HD.  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  Has made little improvement and on 3/30 has worsening hypoxia, generalized pain, hyperkalemia, worsening leukocytosis and PCCM consulted.     SIGNIFICANT EVENTS  3/31 to or p lap  STUDIES:  3/22 TEE>>> severe RAE, severely reduced RV function, severe TR, no LAA thrombus   SUBJECTIVE:  Pressors weaning  VITAL SIGNS: Temp:  [97.2 F (36.2 C)-98.6 F (37 C)] 97.2 F (36.2 C) (04/02 0700) Pulse Rate:  [73-101] 89 (04/02 0803) Resp:  [14-20] 14 (04/02 0803) BP: (122)/(69) 122/69 mmHg (04/02 0803) SpO2:  [98 %-100 %] 100 % (04/02 0803) Arterial Line BP: (93-144)/(49-75) 111/62 mmHg (04/02 0700) FiO2 (%):  [40 %] 40 % (04/02 0803) Weight:  [210 lb 8.6 oz (95.5 kg)] 210 lb 8.6 oz (95.5 kg) (04/02 0447)  PHYSICAL EXAMINATION: General:  Chronically ill appearing female, sedated on vent, but follows commands, on cvvh Neuro: follows commands HEENT:  Mm dry,OTT, no JVD  Cardiovascular:  s1s2 rrr Lungs:  resps even non labored on vent, diminished bases Abdomen:  Round, mildly distended, tender, hypoactive bs , dressing intact Musculoskeletal:  Warm and dry, no sig edema    Recent Labs Lab 07/03/14 0445 07/03/14 1555 07/04/14 0510  NA 137 136 134*  K 3.7 3.8 3.8  CL 104 102 102  CO2 BUN CREATININE 2.15* 1.96* 1.83*  GLUCOSE 158* 158* 201*    Recent Labs Lab 07/02/14 0430 07/03/14 0445 07/04/14 0510  HGB 11.1* 10.0* 8.9*  HCT 35.1* 31.3* 26.9*  WBC  13.6* 13.8* 14.2*  PLT 116* 69* 48*   Dg Chest Port 1 View  07/04/2014   CLINICAL DATA:  Respiratory failure  EXAM: PORTABLE CHEST - 1 VIEW  COMPARISON:  07/03/2014  FINDINGS: Cardiac shadow is stable. A left jugular central line, endotracheal tube, nasogastric catheter and right-sided dialysis catheter are again seen and stable. Improved aeration is noted in the left lung base. No new focal abnormality is noted.  IMPRESSION: Improving infiltrate in the left base. No new focal abnormality is seen.   Electronically Signed   By: Alcide Clever M.D.   On: 07/04/2014 08:10   Dg Chest Port 1 View  07/03/2014   CLINICAL DATA:  Intubation .  EXAM: PORTABLE CHEST - 1 VIEW  COMPARISON:  07/02/2014.  FINDINGS: Endotracheal tube and NG tube in stable position. Left IJ line and right IJ dialysis catheter stable position. Patient rotated to the left. Heart size stable. Pulmonary vascularity normal . Persistent left lower lobe atelectasis and infiltrate. Small left pleural effusion cannot be excluded. No pneumothorax. No acute osseous abnormality .  IMPRESSION: 1. Lines and tubes in stable position. 2. Persistent dense left lower lobe atelectasis and infiltrate. No interim change. Small left pleural effusion cannot be excluded .   Electronically Signed   By: Maisie Fus  Register   On: 07/03/2014 07:35    ASSESSMENT / PLAN:  Acute hypoxemic respiratory failure - multifactorial  initially r/t H1N1, volume overload in setting ESRD.   COPD  LLL Atelectasis/ collapse ?HCAP  ?OSA Post op respiratory failure - Maintain on full vent support. - Titrate O2 for sat of 88-92%. - Pressure support trials today. -Cardiac make may weaning difficult.  CARDIAC Chronic dCHF - EF 60-65%, grade 1 diastolic dysfunction PSVT resolved. Now in septic shock. Plan -  - Levophed for MAP of 65 mmHg. - Hold anti-HTN - See ID section-Wean stress steroids on fresh post op pt.  GI - CDiff neg, denies n/v/d. LFT's ok, s/p exp lap for  pyloric ulcer. Plan -  - Per surgery. - NPO. - Protonix. - TPN.  ID ABX post lap, ?hcap  3/30 diflucan>> 3/30 pip-taz>> 3/31 vanc>>  3/30 anaerobic>> 3/28 bc>> 3/26 c dif>>neg  Renal: Hyperkalemia   Recent Labs Lab 07/03/14 0445 07/03/14 1555 07/04/14 0510  K 3.7 3.8 3.8     ESRD Failed AV fistula - using temp HD cath  Plan -  - CVVH even. - BMET in AM. - Replace electrolytes as indicated. Margaretha Glassing- KVO IVF  Neuro Acute stroke Follows commands  Plan -  - Minimize sedation. - D/C propofol. - Target RASS of 0. - Fentanyl for pain control.  No family bedside.  Begin PS trials, wean pressors and CVVH at even.  Will SBT as tolerated.   Brett CanalesSteve Minor ACNP [facility]Le [facility] PCCM Pager [phone] till 3 pm If no answer page [phone] 07/04/2014, 8:24 AM

## 2014-07-05 LAB — CULTURE, BLOOD (ROUTINE X 2)
CULTURE: NO GROWTH
Culture: NO GROWTH

## 2014-07-05 LAB — CBC
HEMATOCRIT: 25.4 % — AB (ref 36.0–46.0)
HEMOGLOBIN: 8.1 g/dL — AB (ref 12.0–15.0)
MCH: 26.5 pg (ref 26.0–34.0)
MCHC: 31.9 g/dL (ref 30.0–36.0)
MCV: 83 fL (ref 78.0–100.0)
Platelets: DECREASED 10*3/uL (ref 150–400)
RBC: 3.06 MIL/uL — AB (ref 3.87–5.11)
RDW: 19.6 % — ABNORMAL HIGH (ref 11.5–15.5)
WBC: 13.2 10*3/uL — AB (ref 4.0–10.5)

## 2014-07-05 LAB — GLUCOSE, CAPILLARY
GLUCOSE-CAPILLARY: 136 mg/dL — AB (ref 70–99)
GLUCOSE-CAPILLARY: 152 mg/dL — AB (ref 70–99)
GLUCOSE-CAPILLARY: 167 mg/dL — AB (ref 70–99)
GLUCOSE-CAPILLARY: 182 mg/dL — AB (ref 70–99)
Glucose-Capillary: 105 mg/dL — ABNORMAL HIGH (ref 70–99)
Glucose-Capillary: 152 mg/dL — ABNORMAL HIGH (ref 70–99)

## 2014-07-05 LAB — BASIC METABOLIC PANEL
Anion gap: 10 (ref 5–15)
BUN: 49 mg/dL — AB (ref 6–23)
CO2: 21 mmol/L (ref 19–32)
CREATININE: 3.04 mg/dL — AB (ref 0.50–1.10)
Calcium: 7.4 mg/dL — ABNORMAL LOW (ref 8.4–10.5)
Chloride: 101 mmol/L (ref 96–112)
GFR calc Af Amer: 18 mL/min — ABNORMAL LOW (ref >90)
GFR calc non Af Amer: 16 mL/min — ABNORMAL LOW (ref >90)
Glucose, Bld: 167 mg/dL — ABNORMAL HIGH (ref 70–99)
Potassium: 3.3 mmol/L — ABNORMAL LOW (ref 3.5–5.1)
SODIUM: 132 mmol/L — AB (ref 135–145)

## 2014-07-05 LAB — VANCOMYCIN, RANDOM: VANCOMYCIN RM: 38.5 ug/mL

## 2014-07-05 LAB — PHOSPHORUS: Phosphorus: 1.5 mg/dL — ABNORMAL LOW (ref 2.3–4.6)

## 2014-07-05 LAB — MAGNESIUM: Magnesium: 1.9 mg/dL (ref 1.5–2.5)

## 2014-07-05 MED ORDER — PIPERACILLIN-TAZOBACTAM IN DEX 2-0.25 GM/50ML IV SOLN
2.2500 g | Freq: Three times a day (TID) | INTRAVENOUS | Status: DC
  Administered 2014-07-05 – 2014-07-06 (×3): 2.25 g via INTRAVENOUS
  Filled 2014-07-05 (×6): qty 50

## 2014-07-05 MED ORDER — TRACE MINERALS CR-CU-F-FE-I-MN-MO-SE-ZN IV SOLN
INTRAVENOUS | Status: AC
  Administered 2014-07-05: 17:00:00 via INTRAVENOUS
  Filled 2014-07-05: qty 1992

## 2014-07-05 MED ORDER — SODIUM PHOSPHATE 3 MMOLE/ML IV SOLN
30.0000 mmol | Freq: Once | INTRAVENOUS | Status: AC
  Administered 2014-07-05: 30 mmol via INTRAVENOUS
  Filled 2014-07-05: qty 10

## 2014-07-05 MED ORDER — DEXTROSE 5 % IV SOLN
30.0000 mmol | Freq: Once | INTRAVENOUS | Status: DC
  Filled 2014-07-05: qty 10

## 2014-07-05 MED ORDER — FLUCONAZOLE IN SODIUM CHLORIDE 200-0.9 MG/100ML-% IV SOLN
200.0000 mg | INTRAVENOUS | Status: DC
  Administered 2014-07-05: 200 mg via INTRAVENOUS
  Filled 2014-07-05 (×2): qty 100

## 2014-07-05 MED ORDER — SODIUM CHLORIDE 0.9 % IV BOLUS (SEPSIS)
250.0000 mL | Freq: Once | INTRAVENOUS | Status: AC
  Administered 2014-07-05: 250 mL via INTRAVENOUS

## 2014-07-05 MED ORDER — AMIODARONE HCL IN DEXTROSE 360-4.14 MG/200ML-% IV SOLN
INTRAVENOUS | Status: AC
  Filled 2014-07-05: qty 200

## 2014-07-05 NOTE — Progress Notes (Signed)
4 Days Post-Op  Subjective: No flatus yet. Per RN wound has drained some SS fluid.  Objective: Vital signs in last 24 hours: Temp:  [97.3 F (36.3 C)-98.5 F (36.9 C)] 98.5 F (36.9 C) (04/03 0000) Pulse Rate:  [87-103] 97 (04/03 0700) Resp:  [16] 16 (04/02 1202) BP: (140)/(73) 140/73 mmHg (04/02 1202) SpO2:  [96 %-100 %] 99 % (04/03 0834) Arterial Line BP: (114-148)/(63-74) 119/70 mmHg (04/03 0700) FiO2 (%):  [97 %] 97 % (04/02 1202) Weight:  [94.1 kg (207 lb 7.3 oz)] 94.1 kg (207 lb 7.3 oz) (04/03 0500) Last BM Date: 07/29/14  Intake/Output from previous day: 04/02 0701 - 04/03 0700 In: 2937 [I.V.:170; IV Piggyback:858; TPN:1909] Out: 1672 [Emesis/NG output:150; Drains:30] Intake/Output this shift:    General appearance: alert and cooperative GI: soft, JP serosang, some serosang drainage from wound, quiet  Lab Results:   Recent Labs  07/04/14 0510 07/05/14 0530  WBC 14.2* 13.2*  HGB 8.9* 8.1*  HCT 26.9* 25.4*  PLT 48* PLATELET CLUMPS NOTED ON SMEAR, COUNT APPEARS DECREASED   BMET  Recent Labs  07/04/14 0510 07/05/14 0530  NA 134* 132*  K 3.8 3.3*  CL 102 101  CO2 27 21  GLUCOSE 201* 167*  BUN 23 49*  CREATININE 1.83* 3.04*  CALCIUM 7.6* 7.4*   PT/INR No results for input(s): LABPROT, INR in the last 72 hours. ABG  Recent Labs  07/03/14 0305  PHART 7.370  HCO3 25.8*    Studies/Results: Dg Chest Port 1 View  07/04/2014   CLINICAL DATA:  Respiratory failure  EXAM: PORTABLE CHEST - 1 VIEW  COMPARISON:  07/03/2014  FINDINGS: Cardiac shadow is stable. A left jugular central line, endotracheal tube, nasogastric catheter and right-sided dialysis catheter are again seen and stable. Improved aeration is noted in the left lung base. No new focal abnormality is noted.  IMPRESSION: Improving infiltrate in the left base. No new focal abnormality is seen.   Electronically Signed   By: Alcide CleverMark  Lukens M.D.   On: 07/04/2014 08:10     Anti-infectives: Anti-infectives    Start     Dose/Rate Route Frequency Ordered Stop   07/05/14 2000  fluconazole (DIFLUCAN) IVPB 200 mg     200 mg 100 mL/hr over 60 Minutes Intravenous Every 24 hours 07/05/14 0757     07/05/14 1400  piperacillin-tazobactam (ZOSYN) IVPB 2.25 g     2.25 g 100 mL/hr over 30 Minutes Intravenous 3 times per day 07/05/14 0749     07/02/14 1800  vancomycin (VANCOCIN) IVPB 1000 mg/200 mL premix  Status:  Discontinued     1,000 mg 200 mL/hr over 60 Minutes Intravenous Every 24 hours 07/01/14 1858 07/05/14 0801   07/01/14 2200  piperacillin-tazobactam (ZOSYN) IVPB 3.375 g  Status:  Discontinued     3.375 g 100 mL/hr over 30 Minutes Intravenous 4 times per day 07/01/14 1858 07/05/14 0749   07/01/14 2000  fluconazole (DIFLUCAN) IVPB 400 mg  Status:  Discontinued     400 mg 100 mL/hr over 120 Minutes Intravenous Every 24 hours 07/01/14 1858 07/05/14 0757   07/01/14 1400  fluconazole (DIFLUCAN) IVPB 200 mg  Status:  Discontinued     200 mg 100 mL/hr over 60 Minutes Intravenous Every 24 hours 07/01/14 1330 07/01/14 1853   07/01/14 1000  piperacillin-tazobactam (ZOSYN) IVPB 2.25 g  Status:  Discontinued     2.25 g 100 mL/hr over 30 Minutes Intravenous Every 8 hours 07/01/14 0952 07/01/14 1853   07/01/14 1000  vancomycin (VANCOCIN) 500 mg in sodium chloride 0.9 % 100 mL IVPB     500 mg 100 mL/hr over 60 Minutes Intravenous  Once 07/01/14 0952 07/01/14 1126   06/30/14 1200  vancomycin (VANCOCIN) IVPB 1000 mg/200 mL premix     1,000 mg 200 mL/hr over 60 Minutes Intravenous  Once 06/30/14 0944 06/30/14 1403   06/30/14 1200  ceFEPIme (MAXIPIME) 2 g in dextrose 5 % 50 mL IVPB     2 g 100 mL/hr over 30 Minutes Intravenous  Once 06/30/14 0944 06/30/14 1425   06/30/14 0100  vancomycin (VANCOCIN) 2,000 mg in sodium chloride 0.9 % 500 mL IVPB     2,000 mg 250 mL/hr over 120 Minutes Intravenous  Once 06/30/14 0048 06/30/14 0525   06/30/14 0100  ceFEPIme (MAXIPIME) 2 g  in dextrose 5 % 50 mL IVPB     2 g 100 mL/hr over 30 Minutes Intravenous  Once 06/30/14 0048 06/30/14 0322   06/27/14 0600  cefUROXime (ZINACEF) 1.5 g in dextrose 5 % 50 mL IVPB     1.5 g 100 mL/hr over 30 Minutes Intravenous On call to O.R. 06/26/14 1019 06/27/14 0630   06/19/14 1800  oseltamivir (TAMIFLU) capsule 30 mg     30 mg Oral Every M-W-F (1800) 06/19/14 1003 06/22/14 1841   06/18/14 1600  oseltamivir (TAMIFLU) capsule 30 mg  Status:  Discontinued     30 mg Oral Daily 06/18/14 1538 06/19/14 1003      Assessment/Plan: s/p Procedure(s): EXPLORATORY LAPAROTOMY WITH CLOSURE OF PERFORATED PYLORIC ULCER (N/A) SMALL BOWEL RESECTION (N/A) POD#4 Await return of bowel function Allow ice chips Weaning steroids Zosyn  LOS: 18 days    [name] E 07/05/2014

## 2014-07-05 NOTE — Progress Notes (Signed)
[name] noted to have multiple runs of PSVT or possibly parox. a-fib lasting only few seconds. She does have PRN order for metoprolol 5mg  IV-administered as her SBP at the time was 104. After bath at 2200 her BP was at 70s/40s. Patient was little more sleepy, but otherwise denied dizziness and asymptomatic. E-link MD notified and order for 250ml bolusof NS 0.9% obtained and administered. Current BP 89/50 (58). Will continue to monitor closely.

## 2014-07-05 NOTE — Progress Notes (Signed)
ANTIBIOTIC CONSULT NOTE - FOLLOW UP  Pharmacy Consult for vancomycin  Indication: pneumonia  No Known Allergies  Patient Measurements: Height:  (165.1 cm) Weight: 207 lb 7.3 oz (94.1 kg) IBW/kg (Calculated) : 57   Vital Signs: Temp: 97.7 F (36.5 C) (04/03 1600) Temp Source: Oral (04/03 1600) Pulse Rate: 100 (04/03 1300) Intake/Output from previous day: 04/02 0701 - 04/03 0700 In: 2937 [I.V.:170; IV Piggyback:858; TPN:1909] Out: 1672 [Emesis/NG output:150; Drains:30] Intake/Output from this shift: Total I/O In: 1127 [P.O.:30; I.V.:20; NG/GT:20; IV Piggyback:310; TPN:747] Out: 20 [Drains:20]  Labs:  Recent Labs  07/03/14 0445 07/03/14 1555 07/04/14 0510 07/05/14 0530  WBC 13.8*  --  14.2* 13.2*  HGB 10.0*  --  8.9* 8.1*  PLT 69*  --  48* PLATELET CLUMPS NOTED ON SMEAR, COUNT APPEARS DECREASED  CREATININE 2.15* 1.96* 1.83* 3.04*   Estimated Creatinine Clearance: 22.9 mL/min (by C-G formula based on Cr of 3.04).  Recent Labs  07/05/14 1620  VANCORANDOM 38.5     Microbiology: Recent Results (from the past 720 hour(s))  MRSA PCR Screening     Status: None   Collection Time: 06/17/14  9:29 PM  Result Value Ref Range Status   MRSA by PCR NEGATIVE NEGATIVE Final    Comment:        The GeneXpert MRSA Assay (FDA approved for NASAL specimens only), is one component of a comprehensive MRSA colonization surveillance program. It is not intended to diagnose MRSA infection nor to guide or monitor treatment for MRSA infections.   Clostridium Difficile by PCR     Status: None   Collection Time: 06/18/14  3:52 AM  Result Value Ref Range Status   C difficile by pcr NEGATIVE NEGATIVE Final  Clostridium Difficile by PCR     Status: None   Collection Time: 06/27/14  7:40 PM  Result Value Ref Range Status   C difficile by pcr NEGATIVE NEGATIVE Final  Culture, blood (routine x 2)     Status: None   Collection Time: 06/29/14  8:10 AM  Result Value Ref Range  Status   Specimen Description BLOOD RIGHT ANTECUBITAL  Final   Special Requests BOTTLES DRAWN AEROBIC ONLY 3CC  Final   Culture   Final    NO GROWTH 5 DAYS Performed at [facility]    Report Status 07/05/2014 FINAL  Final  Culture, blood (routine x 2)     Status: None   Collection Time: 06/29/14  8:15 AM  Result Value Ref Range Status   Specimen Description BLOOD RIGHT HAND  Final   Special Requests BOTTLES DRAWN AEROBIC ONLY 2CC  Final   Culture   Final    NO GROWTH 5 DAYS Note: Culture results may be compromised due to an inadequate volume of blood received in culture bottles. Performed at [facility]    Report Status 07/05/2014 FINAL  Final  MRSA PCR Screening     Status: None   Collection Time: 07/01/14  2:49 PM  Result Value Ref Range Status   MRSA by PCR NEGATIVE NEGATIVE Final    Comment:        The GeneXpert MRSA Assay (FDA approved for NASAL specimens only), is one component of a comprehensive MRSA colonization surveillance program. It is not intended to diagnose MRSA infection nor to guide or monitor treatment for MRSA infections.   Anaerobic culture     Status: None (Preliminary result)   Collection Time: 07/01/14  4:18 PM  Result Value Ref Range  Status   Specimen Description FLUID PERITONEAL  Final   Special Requests ON SWAB  Final   Gram Stain   Final    RARE WBC PRESENT, PREDOMINANTLY MONONUCLEAR NO ORGANISMS SEEN [facility]    Culture   Final    NO ANAEROBES ISOLATED; CULTURE IN PROGRESS FOR 5 DAYS [facility] at [facility]    Report Status PENDING  Incomplete  Body fluid culture     Status: None   Collection Time: 07/01/14  4:18 PM  Result Value Ref Range Status   Specimen Description FLUID PERITONEAL  Final   Special Requests ON SWAB  Final   Gram Stain   Final    RARE WBC PRESENT, PREDOMINANTLY MONONUCLEAR NO ORGANISMS SEEN [facility]formed at [facility]    Culture   Final    RARE  LACTOBACILLUS SPECIES Note: Standardized susceptibility testing for this organism is not available. CRITICAL RESULT CALLED TO, READ BACK BY AND VERIFIED WITH: CHRIS RN ON 2H AT 1047 [id number] BY CASTC Performed at [facility]    Report Status 07/04/2014 FINAL  Final    Anti-infectives    Start     Dose/Rate Route Frequency Ordered Stop   07/05/14 2000  fluconazole (DIFLUCAN) IVPB 200 mg     200 mg 100 mL/hr over 60 Minutes Intravenous Every 24 hours 07/05/14 0757     07/05/14 1400  piperacillin-tazobactam (ZOSYN) IVPB 2.25 g     2.25 g 100 mL/hr over 30 Minutes Intravenous 3 times per day 07/05/14 0749     07/02/14 1800  vancomycin (VANCOCIN) IVPB 1000 mg/200 mL premix  Status:  Discontinued     1,000 mg 200 mL/hr over 60 Minutes Intravenous Every 24 hours 07/01/14 1858 07/05/14 0801   07/01/14 2200  piperacillin-tazobactam (ZOSYN) IVPB 3.375 g  Status:  Discontinued     3.375 g 100 mL/hr over 30 Minutes Intravenous 4 times per day 07/01/14 1858 07/05/14 0749   07/01/14 2000  fluconazole (DIFLUCAN) IVPB 400 mg  Status:  Discontinued     400 mg 100 mL/hr over 120 Minutes Intravenous Every 24 hours 07/01/14 1858 07/05/14 0757   07/01/14 1400  fluconazole (DIFLUCAN) IVPB 200 mg  Status:  Discontinued     200 mg 100 mL/hr over 60 Minutes Intravenous Every 24 hours 07/01/14 1330 07/01/14 1853   07/01/14 1000  piperacillin-tazobactam (ZOSYN) IVPB 2.25 g  Status:  Discontinued     2.25 g 100 mL/hr over 30 Minutes Intravenous Every 8 hours 07/01/14 0952 07/01/14 1853   07/01/14 1000  vancomycin (VANCOCIN) 500 mg in sodium chloride 0.9 % 100 mL IVPB     500 mg 100 mL/hr over 60 Minutes Intravenous  Once 07/01/14 0952 07/01/14 1126   06/30/14 1200  vancomycin (VANCOCIN) IVPB 1000 mg/200 mL premix     1,000 mg 200 mL/hr over 60 Minutes Intravenous  Once 06/30/14 0944 06/30/14 1403   06/30/14 1200  ceFEPIme (MAXIPIME) 2 g in dextrose 5 % 50 mL IVPB     2 g 100 mL/hr over 30 Minutes  Intravenous  Once 06/30/14 0944 06/30/14 1425   06/30/14 0100  vancomycin (VANCOCIN) 2,000 mg in sodium chloride 0.9 % 500 mL IVPB     2,000 mg 250 mL/hr over 120 Minutes Intravenous  Once 06/30/14 0048 06/30/14 0525   06/30/14 0100  ceFEPIme (MAXIPIME) 2 g in dextrose 5 % 50 mL IVPB     2 g 100 mL/hr over 30 Minutes Intravenous  Once 06/30/14 0048 06/30/14 0322   06/27/14 0600  cefUROXime (ZINACEF) 1.5 g in dextrose 5 % 50 mL IVPB     1.5 g 100 mL/hr over 30 Minutes Intravenous On call to O.R. 06/26/14 1019 06/27/14 0630   06/19/14 1800  oseltamivir (TAMIFLU) capsule 30 mg     30 mg Oral Every M-W-F (1800) 06/19/14 1003 06/22/14 1841   06/18/14 1600  oseltamivir (TAMIFLU) capsule 30 mg  Status:  Discontinued     30 mg Oral Daily 06/18/14 1538 06/19/14 1003     Assessment: 59 yo female on vancomycin/zosyn and fluconazole for possible PNA and peritonitis. She is now s/p Ex-lap with evacuation of ascites, closure of perforated pyloric ulcer and resection of ileum with primary anatomosis (noted on TPN).   CRRT stopped 4/2 plan to transition to IHD now. WBC= 13., SCr= 3.0 (trend up)   Random vancomycin level this afternoon ~24 hours after CRRT stopped resulted high at 38.5. Will follow along renal plan and likely recheck random level after next dialysis session.  Vancomycin 3/29> Cefepime 3/29>3/30 Zosyn 3/30> 3/20 fluconazole>  3/30 peritoneal fluid- rare lactobacillus (no sensitivies performed; usually sensitive to PCN) 3/28 blood x2 - ngtd 3/36 c diff - neg  Goal of Therapy:  Pre-HD trough 15-25  Plan:  Check random vancomycin level in ~48 hours or after HD session to re-establish dosing  Sheppard Coil PharmD., BCPS Clinical Pharmacist Pager [phone] 07/05/2014 6:57 PM

## 2014-07-05 NOTE — Progress Notes (Addendum)
PARENTERAL NUTRITION/ANTIBIOTIC CONSULT NOTE - Follow-up  Pharmacy Consult for TPN, vancomycin, zosyn and diflucan Indication: prolonged ileus, possible PNA and peritonitis  No Known Allergies  Patient Measurements: Height: 5\' 5"  (165.1 cm) Weight: 207 lb 7.3 oz (94.1 kg) IBW/kg (Calculated) : 57  Adjusted body weight: 67  Vital Signs: Temp: 98.5 F (36.9 C) (04/03 0000) Temp Source: Oral (04/03 0000) Pulse Rate: 97 (04/03 0700) Intake/Output from previous day: 04/02 0701 - 04/03 0700 In: 2937 [I.V.:170; IV Piggyback:858; TPN:1909] Out: 1672 [Emesis/NG output:150; Drains:30] Intake/Output from this shift:    Labs:  Recent Labs  07/03/14 0445 07/04/14 0510 07/05/14 0530  WBC 13.8* 14.2* 13.2*  HGB 10.0* 8.9* 8.1*  HCT 31.3* 26.9* 25.4*  PLT 69* 48* PLATELET CLUMPS NOTED ON SMEAR, COUNT APPEARS DECREASED  APTT 38* 36  --      Recent Labs  07/02/14 1600 07/03/14 0445 07/03/14 1555 07/04/14 0510 07/05/14 0530  NA 137 137 136 134* 132*  K 4.3 3.7 3.8 3.8 3.3*  CL 103 104 102 102 101  CO2 25 29 25 27 21   GLUCOSE 102* 158* 158* 201* 167*  BUN 25* 21 19 23  49*  CREATININE 2.71* 2.15* 1.96* 1.83* 3.04*  CALCIUM 8.2* 7.6* 7.8* 7.6* 7.4*  MG  --  2.2  --  2.3 1.9  PHOS 4.1 3.0 2.0* 1.6* 1.5*  PROT  --  5.3*  --   --   --   ALBUMIN 2.2* 2.0* 1.9*  --   --   AST  --  231*  --   --   --   ALT  --  302*  --   --   --   ALKPHOS  --  78  --   --   --   BILITOT  --  1.8*  --   --   --   TRIG  --  113  --   --   --    Estimated Creatinine Clearance: 22.9 mL/min (by C-G formula based on Cr of 3.04).    Recent Labs  07/04/14 2037 07/05/14 0036 07/05/14 0524  GLUCAP 148* 152* 167*    Insulin Requirements in the past 24 hours:  9 units Novolog SSI; 10 units regular insulin in TPN  Current Nutrition:  NPO Clinimix 5/15 at 9883ml/hr provides 100 gm protein and 1414 kcal   Nutritional Goals: per RD 3/31 1400 kCal, 120-140 grams of protein per  day  Assessment: 59 yo F with complicated hospital course. S/p closure perforated pyloric ulcer, SBR for ischemic necrosis, diffuse peritonitis 3/30. Pharmacy consulted to provide TPN for nutrition support.   GI: NPO, bowel rest. TPN. NG o/p 150 ml/24h.Prealbumin from 4/1 still pending -unusual to be pending so long - called lab who had to call outside lab and unsure why not crossing over but results faxed over and prealbumin 5 (low, not surprising with extensive recent surgery)  Endo: CBGs <180. Stress dose steroids tapered off 4/2. SSI and regular insulin in TPN.  Lytes: K 3.3 (goal >4 with ileus), Mg 1.9 (goal >2 with ileus), Phos 1.5 (still low post replacement yesterday) - MD replacing with sodium phosphate 30mmol, Corr Ca 9.1 (alb 1.9)  Renal: ESRD, new start HD this admission. CRRT off 4/2 ~1400 due to filter clotting. Nephrology plans to transition to IHD. I/O positive 1L past 24 hrs.  Pulm: Pt extubated 4/2. 2L [place]  Cards: Off pressors. BP soft. HR ok.  Hepatobil: LFTs elevated but trending down. TG 113.  Neuro: New stroke noted on 3/18.   ID: vanc/zosyn/diflucan for possible PNA and peritonitis. S/p OR for perforated pyloric ulcer, evacuation of ascites and small bowel resection for ischemic necrosis. WBC 13.2, Afeb.  Vancomycin 3/29> Cefepime 3/29>3/30 Zosyn 3/30> 3/20 fluconazole>  3/30 peritoneal fluid- rare lactobacillus (no sensitivies performed; usually sensitive to PCN) 3/28 blood x2 - ngtd 3/36 c diff - neg   Best Practices: Lovenox, PPI IV  TPN Access: CVC  TPN day#: 3  Plan:  - Continue Clinimix 5/15 (NO electrolytes) at 83 ml/hr which will meet 100% calorie goals and 83% protein goals. It will be impossible to meet high protein and low kcal goals utilizing pre-made TPN but will mazimize protein and minimize calories as much as possible) - Will continue with NO IVFE for first 7 days in ICU patient according to SCCM/ASPEN guidelines.  - Continue empiric SSI  and 10 units regular insulin in TPN - Standard MVI and trace elements daily in TPN - May need to add electrolytes back to TPN if lytes continue to be low off CRRT - F/u TPN labs  - Will re-consult RD to re-evaluate goals now that pt off CRRT - D/C scheduled Vancomycin now that pt off CRRT. Will check level ~1630 today and then dose based on HD schedule - Change Zosyn to 2.25gm IV Q8H - Change fluconazole to  IV Q24H - Noted plan for IHD tomorrow - F/u plt trend, HIT results  Christoper Fabian, PharmD, BCPS Clinical pharmacist, pager [phone] 07/05/2014 7:34 AM

## 2014-07-05 NOTE — Progress Notes (Signed)
UR Completed.  [phone]  

## 2014-07-05 NOTE — Progress Notes (Signed)
Name: [name] MRN: [id number] DOB: [birth year]    ADMISSION DATE:  06/17/2014 CONSULTATION DATE:  3/30  REFERRING MD :  Mahala Menghini   CHIEF COMPLAINT:  Hypoxia   BRIEF PATIENT DESCRIPTION: 59 yo female smoker with hx HTN, ESRD on HD (just began this admit), COPD, failed L AV fistula, hx COPD, initially admitted 3/16 with flu, acute hypoxic resp failure.  Ultimately tx to [facility] for worsening metabolic parameters and need for HD.  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  Has made little improvement and on 3/30 has worsening hypoxia, generalized pain, hyperkalemia, worsening leukocytosis and PCCM consulted.     SIGNIFICANT EVENTS  3/31 to or p lap  STUDIES:  3/22 TEE>>> severe RAE, severely reduced RV function, severe TR, no LAA thrombus   SUBJECTIVE:  extubated  VITAL SIGNS: Temp:  [97.2 F (36.2 C)-98.5 F (36.9 C)] 98.5 F (36.9 C) (04/03 0000) Pulse Rate:  [82-103] 97 (04/03 0700) Resp:  [14-16] 16 (04/02 1202) BP: (122-140)/(69-73) 140/73 mmHg (04/02 1202) SpO2:  [96 %-100 %] 99 % (04/03 0700) Arterial Line BP: (108-148)/(60-74) 119/70 mmHg (04/03 0700) FiO2 (%):  [40 %-97 %] 97 % (04/02 1202) Weight:  [207 lb 7.3 oz (94.1 kg)] 207 lb 7.3 oz (94.1 kg) (04/03 0500)  PHYSICAL EXAMINATION: General:  Chronically ill appearing female, awake and intractive Neuro: follows commands HEENT:  Mm dry,no JVD  Cardiovascular:  s1s2 rrr Lungs:  resps even non labored , productive cough gray sputum Abdomen:  Round, mildly distended, tender, hypoactive bs , dressing intact Musculoskeletal:  Warm and dry, no sig edema    Recent Labs Lab 07/03/14 1555 07/04/14 0510 07/05/14 0530  NA 136 134* 132*  K 3.8 3.8 3.3*  CL 102 102 101  CO2 BUN 19 23 49*  CREATININE 1.96* 1.83* 3.04*  GLUCOSE 158* 201* 167*    Recent Labs Lab 07/03/14 0445 07/04/14 0510 07/05/14 0530  HGB 10.0* 8.9* 8.1*  HCT 31.3* 26.9* 25.4*  WBC 13.8* 14.2* 13.2*    PLT 69* 48* PLATELET CLUMPS NOTED ON SMEAR, COUNT APPEARS DECREASED   Dg Chest Port 1 View  07/04/2014   CLINICAL DATA:  Respiratory failure  EXAM: PORTABLE CHEST - 1 VIEW  COMPARISON:  07/03/2014  FINDINGS: Cardiac shadow is stable. A left jugular central line, endotracheal tube, nasogastric catheter and right-sided dialysis catheter are again seen and stable. Improved aeration is noted in the left lung base. No new focal abnormality is noted.  IMPRESSION: Improving infiltrate in the left base. No new focal abnormality is seen.   Electronically Signed   By: Alcide Clever M.D.   On: 07/04/2014 08:10    ASSESSMENT / PLAN:  Acute hypoxemic respiratory failure - multifactorial initially r/t H1N1, volume overload in setting ESRD.   OTT 3/31=->4/2 COPD  LLL Atelectasis/ collapse ?HCAP  ?OSA Post op respiratory failure -extubated 4/2. -pulmonary toilet -mobilize if ok with ccs  CARDIAC Chronic dCHF - EF 60-65%, grade 1 diastolic dysfunction PSVT resolved. Now in septic shock. Plan -  - Levophed for MAP of 65 mmHg. Off 4/3 - Hold anti-HTN - See ID section-Wean stress steroids on fresh post op pt.  GI - CDiff neg, denies n/v/d. LFT's ok, s/p exp lap for pyloric ulcer. Plan -  - Per surgery. - NPO. - Protonix. - TPN.  ID ABX post lap, ?hcap  3/30 diflucan>> 3/30 pip-taz>> 3/31 vanc>>  3/30 anaerobic>>rare lactobacillus  3/28 bc>> 3/26 c dif>>neg  Renal: Hyperkalemia   Recent Labs Lab 07/03/14 1555 07/04/14 0510 07/05/14 0530  K 3.8 3.8 3.3*     ESRD Failed AV fistula - using temp HD cath  Plan -  - CVVH even. - BMET in AM. - Replace electrolytes as indicated. Margaretha Glassing- KVO IVF  Neuro Acute stroke Follows commands  Plan -  - Minimize sedation. - D/C propofol. - Target RASS of 0. - Fentanyl for pain control.  No family bedside.  Extubated and in no distress.   Brett CanalesSteve Kallen Delatorre ACNP [facility] Pager [phone] till 3 pm If no answer page [phone] 07/05/2014,  7:59 AM

## 2014-07-05 NOTE — Progress Notes (Signed)
[facility] Progress Note  Assessment/Plan: 1. Perforated pyloric ulcer and ischemic bowel-s/p surgery on 3/30- now on vanc and zosyn.  Ischemic bowel could have been embolic this admit.    2. CVA/cryptogenic stroke - acute ACA, left hemiparesis; neuro following - 3. ESRD - new start this admission (sister was [facility], prev on HD at [facility])- problems with AVF 3/25 - had fistulagram 3/21 without intervention - showed focal aneurysmal dilitation and competing venous outflow in the deep venous system -VVS wanting to wait for her to recover before revision of AVF is considered -  - right IJ PC placed 3/26 - - using it now for CRRT over weekend given hemodynamic instability- stopped Saturday.  Will plan for IHD tomorrow 4. Anemia - Hgb previously good- CKD plus surgery- has dropped- added ESA and also on iron repletion 5. Secondary hyperparathyroidism - iPTH 338 - 3/21, 531 3/18 - started hectorol 1- phos has been low 6. HTN/volume -  Plan for moderate UF tomorrow  7. Nutrition - alb low 8. COPD/tobacco - encouraged smoking cessation. 9. S/p tx for Infuenza A/HIN1 10. Severe R HF per TEE  11. Renal mass per [place] 3/16 - "Indeterminate 1.9 cm hypoechoic mass off the interpolar region of the left kidney. This may potentially represent a cyst however solid mass is not excluded. Recommend follow-up in the outpatient setting with eitherpre and post enhanced CT or MRI." 12. DM - per primary  14. Increased WBC- was peritonitis- is correcting 15.  Dispo- certainly seems like patient has not thrived well with this whole thing. Will need to see how she recovers from this latest set back 16- hypophos- will replete    [name] A   07/05/2014, 8:01 AM    Subjective:  Stable overnight, pressors off- she is alert  Objective Filed Vitals:   07/05/14 0400 07/05/14 0500 07/05/14 0600 07/05/14 0700  BP:      Pulse: 96 97 98 97  Temp:      TempSrc:      Resp:      Height:       Weight:  94.1 kg (207 lb 7.3 oz)    SpO2: 98% 99% 97% 99%   Physical Exam General: alert, responsive  Heart: RRR Lungs: coarse BS clear some with cough Abdomen: soft + BS, epigastric tenderness -no rebound/guarding Extremities:1+ LE edema Dialysis Access: new right IJ and left upper AVF + bruit - bruising  Dialysis Orders: none - new start to HD  Additional Objective Labs: Basic Metabolic Panel:  Recent Labs Lab 07/03/14 1555 07/04/14 0510 07/05/14 0530  NA 136 134* 132*  K 3.8 3.8 3.3*  CL 102 102 101  CO2 GLUCOSE 158* 201* 167*  BUN 19 23 49*  CREATININE 1.96* 1.83* 3.04*  CALCIUM 7.8* 7.6* 7.4*  PHOS 2.0* 1.6* 1.5*   Liver Function Tests:  Recent Labs Lab 07/01/14 1813 07/02/14 0430 07/02/14 1600 07/03/14 0445 07/03/14 1555  AST 1352* 757*  --  231*  --   ALT 414* 455*  --  302*  --   ALKPHOS 59 65  --  78  --   BILITOT 2.2* 2.8*  --  1.8*  --   PROT 4.5* 5.2*  --  5.3*  --   ALBUMIN 2.3*  2.2* 2.4* 2.2* 2.0* 1.9*   CBC:  Recent Labs Lab 07/01/14 1445  07/01/14 1813 07/02/14 0430 07/03/14 0445 07/04/14 0510 07/05/14 0530  WBC 20.2*  --  11.8*  12.0* 13.6* 13.8* 14.2* 13.2*  NEUTROABS 17.8*  --  8.6* 11.4*  --   --   --   HGB 13.4  < > 10.5*  10.6* 11.1* 10.0* 8.9* 8.1*  HCT 41.7  < > 32.9*  32.7* 35.1* 31.3* 26.9* 25.4*  MCV 82.6  --  82.5  81.5 81.3 81.5 79.6 83.0  PLT 154  --  125*  108* 116* 69* 48* PLATELET CLUMPS NOTED ON SMEAR, COUNT APPEARS DECREASED  < > = values in this interval not displayed. CBG:  Recent Labs Lab 07/04/14 1240 07/04/14 1608 07/04/14 2037 07/05/14 0036 07/05/14 0524  GLUCAP 210* 192* 148* 152* 167*  Medications: . sodium chloride 10 mL/hr at 07/03/14 0900  . norepinephrine (LEVOPHED) Adult infusion 2 mcg/min (07/04/14 0127)  . TPN (CLINIMIX) Adult without lytes 83 mL/hr at 07/04/14 1718   . chlorhexidine  15 mL Mouth Rinse BID  . darbepoetin (ARANESP) injection - DIALYSIS  100 mcg  Intravenous Q Sat-HD  . dextrose  1 ampule Intravenous Once  . dextrose      . ferric gluconate (FERRLECIT/NULECIT) IV  125 mg Intravenous Q M,W,F-HD  . fluconazole (DIFLUCAN) IV  200 mg Intravenous Q24H  . insulin aspart  0-9 Units Subcutaneous 6 times per day  . ipratropium  0.5 mg Nebulization Q6H  . levalbuterol  0.63 mg Nebulization Q6H  . pantoprazole (PROTONIX) IV  40 mg Intravenous Q12H  . piperacillin-tazobactam (ZOSYN)  IV  2.25 g Intravenous 3 times per day  . potassium phosphate Dextrose 5% IVPB 250 mL  30 mmol Intravenous Once  . vancomycin  1,000 mg Intravenous Q24H

## 2014-07-05 NOTE — Evaluation (Addendum)
Physical Therapy Evaluation Patient Details Name: [name] MRN: [id number] DOB: [birth year] Today's Date: 07/05/2014   History of Present Illness  [name] is a 59 y.o. female with a history of CKD and hypertension [facility]16 complaining of shortness of breath and generalized weakness x 2 weeks. Admitted with acute hypoxemic respiratory failure. During hospital stay noted to have weakness of her left side. 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    Clinical Impression  Pt admitted with above diagnosis. Pt currently with functional limitations due to the deficits listed below (see PT Problem List).   Pt will need LTACH vs SNF  for therapy prior to d/c home. Pt has so many medical issues hopefully will qualify for LTACH.   Will follow acutely. Pt will benefit from skilled PT to increase their independence and safety with mobility to allow discharge to the venue listed below.      Follow Up Recommendations LTACH vs. SNF;Supervision/Assistance - 24 hour    Equipment Recommendations  Other (comment) (TBA)    Recommendations for Other Services       Precautions / Restrictions Precautions Precautions: Fall Restrictions Weight Bearing Restrictions: No      Mobility  Bed Mobility Overal bed mobility: Needs Assistance;+2 for physical assistance Bed Mobility: Rolling;Sidelying to Sit;Sit to Supine Rolling: Mod assist;Max assist;+2 for physical assistance Sidelying to sit: Max assist;+2 for physical assistance   Sit to supine: +2 for physical assistance;Max assist   General bed mobility comments: Assist to progress LLE to EOB, however pt did assist a little. VC's to bring LEs to EOB and to reach across with RUE for bed rail. Pt rolled to her left side with use of bed rail and required max  A to elevate trunk off bed.   Transfers                  General transfer comment: unable  Ambulation/Gait             General Gait Details: unable  Stairs            Wheelchair Mobility    Modified Rankin (Stroke Patients Only) Modified Rankin (Stroke Patients Only) Pre-Morbid Rankin Score: Slight disability Modified Rankin: Severe disability     Balance Overall balance assessment: Needs assistance;History of Falls Sitting-balance support: Single extremity supported;Feet supported Sitting balance-Leahy Scale: Poor Sitting balance - Comments: Pt required max to total assist to sit EOB with pt pushing at times with [name].  Tried to assist with pt leaning on right elbow but difficult due to pt with fatigue and impaired cognition.  Was able to get a little weight bearing on [name] in sitting. Pt was able to reach to end of bed and hold on to bed and held self in upright position for 3-4 minutes.  Pt fatigues.  Cannot reach outside BOS without LOB.  Was able to sit EOB close to midline however.   Postural control: Left lateral lean;Posterior lean                                   Pertinent Vitals/Pain Pain Assessment: Faces Faces Pain Scale: Hurts whole lot Pain Location: abdominal pain Pain Descriptors / Indicators: Aching;Operative site guarding;Sharp Pain Intervention(s): Limited activity within patient's  tolerance;Monitored during session;Repositioned;Patient requesting pain meds-RN notified  HR up to 150 from 96 bpm at end of rx.  Asked nursing for pain meds and HR to 95bpm by the time PT left unit.     Home Living Family/patient expects to be discharged to:: Inpatient rehab Living Arrangements: Children (daughter) Available Help at Discharge: Family Type of Home: Apartment Home Access: Stairs to enter   Secretary/administrator of Steps: 12 Home Layout: One level Home Equipment: None Additional Comments: tub shower, standard height toilets    Prior Function Level of Independence:  Independent               Hand Dominance   Dominant Hand: Right    Extremity/Trunk Assessment   Upper Extremity Assessment: Defer to OT evaluation               LLE Deficits / Details: Gross motor assessment with 2/5 strength most motions, assist required against gravity, decreased sensation  Cervical / Trunk Assessment:  (difficulty recognizing midline)  Communication   Communication: No difficulties  Cognition Arousal/Alertness: Lethargic Behavior During Therapy: WFL for tasks assessed/performed Overall Cognitive Status: No family/caregiver present to determine baseline cognitive functioning                      General Comments      Exercises        Assessment/Plan    PT Assessment Patient needs continued PT services  PT Diagnosis Difficulty walking;Hemiplegia non-dominant side   PT Problem List Decreased strength;Decreased range of motion;Decreased activity tolerance;Decreased balance;Decreased mobility;Decreased coordination;Impaired sensation;Obesity  PT Treatment Interventions DME instruction;Gait training;Stair training;Functional mobility training;Therapeutic activities;Therapeutic exercise;Balance training;Neuromuscular re-education;Patient/family education   PT Goals (Current goals can be found in the Care Plan section) Acute Rehab PT Goals Patient Stated Goal: not stated PT Goal Formulation: With patient Time For Goal Achievement: 07/19/14 Potential to Achieve Goals: Good    Frequency Min 3X/week   Barriers to discharge        Co-evaluation               End of Session Equipment Utilized During Treatment: Gait belt;Oxygen Activity Tolerance: Patient limited by fatigue;Patient limited by pain Patient left: in bed;with call bell/phone within reach;with family/visitor present Nurse Communication: Mobility status;Patient requests pain meds         Time: 1610-9604 PT Time Calculation (min) (ACUTE ONLY): 19  min   Charges:   PT Evaluation $Initial PT Evaluation Tier I: 1 Procedure     PT G CodesBerline [name] 2014/07/16, 3:10 PM [name],PT Acute Rehabilitation [phone] (pager)

## 2014-07-06 ENCOUNTER — Inpatient Hospital Stay (HOSPITAL_COMMUNITY): Payer: Medicaid Other

## 2014-07-06 LAB — CBC
HEMATOCRIT: 27.7 % — AB (ref 36.0–46.0)
HEMOGLOBIN: 8.8 g/dL — AB (ref 12.0–15.0)
MCH: 26.2 pg (ref 26.0–34.0)
MCHC: 31.8 g/dL (ref 30.0–36.0)
MCV: 82.4 fL (ref 78.0–100.0)
PLATELETS: 45 10*3/uL — AB (ref 150–400)
RBC: 3.36 MIL/uL — ABNORMAL LOW (ref 3.87–5.11)
RDW: 19.8 % — ABNORMAL HIGH (ref 11.5–15.5)
WBC: 24 10*3/uL — AB (ref 4.0–10.5)

## 2014-07-06 LAB — DIFFERENTIAL
Basophils Absolute: 0 10*3/uL (ref 0.0–0.1)
Basophils Relative: 0 % (ref 0–1)
EOS ABS: 0.2 10*3/uL (ref 0.0–0.7)
EOS PCT: 1 % (ref 0–5)
LYMPHS PCT: 4 % — AB (ref 12–46)
Lymphs Abs: 1 10*3/uL (ref 0.7–4.0)
MONO ABS: 1.7 10*3/uL — AB (ref 0.1–1.0)
MONOS PCT: 7 % (ref 3–12)
Neutro Abs: 21.1 10*3/uL — ABNORMAL HIGH (ref 1.7–7.7)
Neutrophils Relative %: 88 % — ABNORMAL HIGH (ref 43–77)

## 2014-07-06 LAB — COMPREHENSIVE METABOLIC PANEL
ALK PHOS: 75 U/L (ref 39–117)
ALT: 69 U/L — AB (ref 0–35)
AST: 25 U/L (ref 0–37)
Albumin: 1.4 g/dL — ABNORMAL LOW (ref 3.5–5.2)
Anion gap: 8 (ref 5–15)
BUN: 80 mg/dL — ABNORMAL HIGH (ref 6–23)
CALCIUM: 7.3 mg/dL — AB (ref 8.4–10.5)
CO2: 21 mmol/L (ref 19–32)
Chloride: 100 mmol/L (ref 96–112)
Creatinine, Ser: 4.59 mg/dL — ABNORMAL HIGH (ref 0.50–1.10)
GFR calc Af Amer: 11 mL/min — ABNORMAL LOW (ref >90)
GFR, EST NON AFRICAN AMERICAN: 10 mL/min — AB (ref >90)
GLUCOSE: 168 mg/dL — AB (ref 70–99)
Potassium: 3.3 mmol/L — ABNORMAL LOW (ref 3.5–5.1)
SODIUM: 129 mmol/L — AB (ref 135–145)
TOTAL PROTEIN: 4.5 g/dL — AB (ref 6.0–8.3)
Total Bilirubin: 1.9 mg/dL — ABNORMAL HIGH (ref 0.3–1.2)

## 2014-07-06 LAB — RENAL FUNCTION PANEL
Albumin: 1.4 g/dL — ABNORMAL LOW (ref 3.5–5.2)
Anion gap: 13 (ref 5–15)
BUN: 92 mg/dL — ABNORMAL HIGH (ref 6–23)
CO2: 18 mmol/L — ABNORMAL LOW (ref 19–32)
CREATININE: 5.21 mg/dL — AB (ref 0.50–1.10)
Calcium: 7.6 mg/dL — ABNORMAL LOW (ref 8.4–10.5)
Chloride: 95 mmol/L — ABNORMAL LOW (ref 96–112)
GFR, EST AFRICAN AMERICAN: 10 mL/min — AB (ref >90)
GFR, EST NON AFRICAN AMERICAN: 8 mL/min — AB (ref >90)
Glucose, Bld: 208 mg/dL — ABNORMAL HIGH (ref 70–99)
PHOSPHORUS: 2.7 mg/dL (ref 2.3–4.6)
Potassium: 3.2 mmol/L — ABNORMAL LOW (ref 3.5–5.1)
SODIUM: 126 mmol/L — AB (ref 135–145)

## 2014-07-06 LAB — GLUCOSE, CAPILLARY
GLUCOSE-CAPILLARY: 149 mg/dL — AB (ref 70–99)
GLUCOSE-CAPILLARY: 146 mg/dL — AB (ref 70–99)
GLUCOSE-CAPILLARY: 179 mg/dL — AB (ref 70–99)
Glucose-Capillary: 157 mg/dL — ABNORMAL HIGH (ref 70–99)
Glucose-Capillary: 119 mg/dL — ABNORMAL HIGH (ref 70–99)

## 2014-07-06 LAB — TRIGLYCERIDES: TRIGLYCERIDES: 88 mg/dL (ref <150)

## 2014-07-06 LAB — MAGNESIUM: MAGNESIUM: 1.7 mg/dL (ref 1.5–2.5)

## 2014-07-06 LAB — PHOSPHORUS: PHOSPHORUS: 3.1 mg/dL (ref 2.3–4.6)

## 2014-07-06 MED ORDER — DEXTROSE 5 % IV SOLN
0.0000 ug/min | INTRAVENOUS | Status: DC
  Filled 2014-07-06: qty 4

## 2014-07-06 MED ORDER — TRACE MINERALS CR-CU-F-FE-I-MN-MO-SE-ZN IV SOLN
INTRAVENOUS | Status: AC
  Administered 2014-07-06: 17:00:00 via INTRAVENOUS
  Filled 2014-07-06: qty 1992

## 2014-07-06 MED ORDER — SODIUM CHLORIDE 0.9 % FOR CRRT
INTRAVENOUS_CENTRAL | Status: DC | PRN
  Administered 2014-07-13: 04:00:00 via INTRAVENOUS_CENTRAL
  Filled 2014-07-06 (×2): qty 1000

## 2014-07-06 MED ORDER — PIPERACILLIN-TAZOBACTAM IN DEX 2-0.25 GM/50ML IV SOLN
2.2500 g | Freq: Four times a day (QID) | INTRAVENOUS | Status: DC
  Administered 2014-07-06 – 2014-07-13 (×28): 2.25 g via INTRAVENOUS
  Filled 2014-07-06 (×30): qty 50

## 2014-07-06 MED ORDER — FENTANYL CITRATE 0.05 MG/ML IJ SOLN
25.0000 ug | INTRAMUSCULAR | Status: DC | PRN
  Administered 2014-07-06: 50 ug via INTRAVENOUS
  Administered 2014-07-07: 25 ug via INTRAVENOUS
  Administered 2014-07-07 – 2014-07-11 (×20): 50 ug via INTRAVENOUS
  Administered 2014-07-11 – 2014-07-12 (×6): 25 ug via INTRAVENOUS
  Administered 2014-07-13 (×2): 50 ug via INTRAVENOUS
  Administered 2014-07-13: 25 ug via INTRAVENOUS
  Administered 2014-07-13: 50 ug via INTRAVENOUS
  Administered 2014-07-13: 25 ug via INTRAVENOUS
  Administered 2014-07-13 – 2014-07-14 (×2): 50 ug via INTRAVENOUS
  Administered 2014-07-14 (×3): 25 ug via INTRAVENOUS
  Administered 2014-07-15 (×3): 50 ug via INTRAVENOUS
  Filled 2014-07-06 (×76): qty 2

## 2014-07-06 MED ORDER — IOHEXOL 300 MG/ML  SOLN
25.0000 mL | INTRAMUSCULAR | Status: AC
  Administered 2014-07-06 (×2): 25 mL via ORAL

## 2014-07-06 MED ORDER — HEPARIN SODIUM (PORCINE) 1000 UNIT/ML DIALYSIS: 1000.0000 [IU] | INTRAMUSCULAR | Status: DC | PRN

## 2014-07-06 MED ORDER — FLUCONAZOLE IN SODIUM CHLORIDE 400-0.9 MG/200ML-% IV SOLN
400.0000 mg | INTRAVENOUS | Status: DC
  Administered 2014-07-06 – 2014-07-12 (×7): 400 mg via INTRAVENOUS
  Filled 2014-07-06 (×8): qty 200

## 2014-07-06 MED ORDER — METOPROLOL TARTRATE 1 MG/ML IV SOLN
2.5000 mg | INTRAVENOUS | Status: DC | PRN
  Administered 2014-07-06 – 2014-07-20 (×3): 5 mg via INTRAVENOUS
  Filled 2014-07-06 (×4): qty 5

## 2014-07-06 MED ORDER — PRISMASOL BGK 4/2.5 32-4-2.5 MEQ/L IV SOLN
INTRAVENOUS | Status: DC
  Administered 2014-07-06 – 2014-07-12 (×34): via INTRAVENOUS_CENTRAL
  Filled 2014-07-06 (×48): qty 5000

## 2014-07-06 MED ORDER — PRISMASOL BGK 4/2.5 32-4-2.5 MEQ/L IV SOLN
INTRAVENOUS | Status: DC
  Administered 2014-07-06 – 2014-07-13 (×11): via INTRAVENOUS_CENTRAL
  Filled 2014-07-06 (×16): qty 5000

## 2014-07-06 MED ORDER — ALTEPLASE 2 MG IJ SOLR
2.0000 mg | Freq: Once | INTRAMUSCULAR | Status: AC | PRN
  Filled 2014-07-06: qty 2

## 2014-07-06 MED ORDER — SODIUM CHLORIDE 0.9 % IV BOLUS (SEPSIS)
500.0000 mL | Freq: Once | INTRAVENOUS | Status: AC
  Administered 2014-07-06: 500 mL via INTRAVENOUS

## 2014-07-06 MED ORDER — PRISMASOL BGK 4/2.5 32-4-2.5 MEQ/L IV SOLN
INTRAVENOUS | Status: DC
  Administered 2014-07-06 – 2014-07-12 (×6): via INTRAVENOUS_CENTRAL
  Filled 2014-07-06 (×8): qty 5000

## 2014-07-06 NOTE — Progress Notes (Signed)
NUTRITION FOLLOW-UP   DOCUMENTATION CODES Per approved criteria  -Obesity Unspecified   INTERVENTION: Clinimix per Pharmacy, maximize protein Nutrition goals: 2200-2600 kcal and 120-140 grams protein per day  NUTRITION DIAGNOSIS: Inadequate oral intake  related to altered GI function as evidenced by NPO status, ongoing.   Goal: Enteral nutrition to provide 65-70% of estimated calorie needs based on ASPEN guidelines for hypocaloric, high protein feeding in critically ill obese individuals; not met.   Monitor:  TPN, weight trends, labs, I/O's  ASSESSMENT: Pt with CKD and hypertension presents with of shortness of breath and generalized weakness x 2 weeks. Pt found to have metabolic acidosis from worsening uremia, H1N1 flu. Pt started on HD. 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  Pt transferred to ICU due to acute abdominal pain. Pt s/p exp lab, evacuation of ascites, closure of ulcer, 14 in resection of ileum due to perforated pyloric ulcer with diffuse peritonitis, ischemic necrosis of mid ileum.   Pt extubated 4/2.  Pt stopped CRRT 4/2 and started IHD 4/4. Per nephrology pt may need to go back to CRRT.   TPN: Clinimix E 5/15 @ 83 ml/hr Provides: 1413 kcal and 99 grams protein Discussed new estimated needs with Pharmacy Sodium and potassium low  Height: Ht Readings from Last 1 Encounters:  06/26/14 _0  (1.651 m)    Weight: Wt Readings from Last 1 Encounters:  07/06/14 220 lb 10.9 oz (100.1 kg)  Usual weight: 220 lb (100 kg)  BMI:  Body mass index is 36.72 kg/(m^2). Class II obesity  Adjusted body weight: 91 kg  Estimated Nutritional Needs: Kcal: 2200-2600 Protein: 120-140 grams Fluid: 1.2 L/day  Skin: +1 LUE, LLE edema  Diet Order: TPN (CLINIMIX) Adult without lytes Diet NPO time specified Except for: Ice Chips .TPN (CLINIMIX-E) Adult   Intake/Output Summary (Last 24 hours) at 07/06/14  1421 Last data filed at 07/06/14 1000  Gross per 24 hour  Intake   2373 ml  Output    600 ml  Net   1773 ml    Last BM: 3/27 x 2 (loose)  Labs:   Recent Labs Lab 07/04/14 0510 07/05/14 0530 07/06/14 0552  NA 134* 132* 129*  K 3.8 3.3* 3.3*  CL 102 101 100  CO2 _1 BUN 23 49* 80*  CREATININE 1.83* 3.04* 4.59*  CALCIUM 7.6* 7.4* 7.3*  MG 2.3 1.9 1.7  PHOS 1.6* 1.5* 3.1  GLUCOSE 201* 167* 168*    CBG (last 3)   Recent Labs  07/06/14 0522 07/06/14 0754 07/06/14 1106  GLUCAP 157* 149* 146*    Scheduled Meds: . chlorhexidine  15 mL Mouth Rinse BID  . darbepoetin (ARANESP) injection - DIALYSIS  100 mcg Intravenous Q Sat-HD  . ferric gluconate (FERRLECIT/NULECIT) IV  125 mg Intravenous Q M,W,F-HD  . fluconazole (DIFLUCAN) IV  200 mg Intravenous Q24H  . insulin aspart  0-9 Units Subcutaneous 6 times per day  . iohexol  25 mL Oral Q1 Hr x 2  . ipratropium  0.5 mg Nebulization Q6H  . levalbuterol  0.63 mg Nebulization Q6H  . pantoprazole (PROTONIX) IV  40 mg Intravenous Q12H  . piperacillin-tazobactam (ZOSYN)  IV  2.25 g Intravenous 3 times per day    Continuous Infusions: . Marland KitchenTPN (CLINIMIX-E) Adult    . sodium chloride 10 mL/hr at 07/03/14 0900  . norepinephrine (LEVOPHED) Adult infusion 2 mcg/min (07/06/14 1115)  .  dialysis replacement fluid (prismasate)    . dialysis replacement fluid (prismasate)    . dialysate (PRISMASATE)    . TPN [facility]) Adult without lytes 83 mL/hr at 07/05/14 [place], [place], [place] Pager [phone] After Hours Pager

## 2014-07-06 NOTE — Progress Notes (Addendum)
General Surgery Note  LOS: 19 days  POD -  5 Days Post-Op  Assessment/Plan: 1.  EXPLORATORY LAPAROTOMY WITH CLOSURE OF PERFORATED PYLORIC ULCER, SMALL BOWEL RESECTION - 07/01/2014 - Derrell LollingIngram  Diflucan/Zosyn  Has NGT - will need to continue this for now 1B.  Open wound - clean  2.  Leukocytosis  Not sure why her WBC jumped up - she looks pretty good  Consider CT scan in 2 or 3 days, if it stays up  3.  Anemia - 8.8 - 07/06/2014 4.  ESRD 5.  Nutrition - on TPN 6.  Stroke - 06/19/2014  Left hemiplegia - this will probably limit her mobility and return to normal activities 7.  Activity - Dr. Burtis JunesSadek to order PT    Principal Problem:   Acute respiratory failure with hypoxia Active Problems:   Cerebral thrombosis with cerebral infarction   Hypertension   ESRD needing dialysis   Obesity (BMI 30-39.9)   Depression   Left renal mass   Chronic diastolic heart failure   H1N1 influenza   Tobacco use disorder   Renal failure (ARF), acute on chronic   Hyperlipidemia   PSVT (paroxysmal supraventricular tachycardia)   Hypoxia   SOB (shortness of breath)   Perforated gastric ulcer  Subjective:  Doing okay.  She says that she has passed flatus.  No BM.  Wants a honey bun.  Objective:   Filed Vitals:   07/06/14 0800  BP: 85/47  Pulse: 103  Temp:   Resp: 22     Intake/Output from previous day:  04/03 0701 - 04/04 0700 In: 2962 [P.O.:30; I.V.:160; NG/GT:20; IV Piggyback:760; TPN:1992] Out: 610 [Emesis/NG output:550; Drains:60]  Intake/Output this shift:  Total I/O In: 93 [I.V.:10; TPN:83] Out: -    Physical Exam:   General: [name] who is alert and oriented.    HEENT: Normal. Pupils equal. .   Lungs: Wheezes.  Has right subclavian line.   Abdomen: Soft.  Rare BS.  Drain in RLQ - only 60 cc recorded yesterday   Wound: Clean.   Lab Results:    Recent Labs  07/05/14 0530 07/06/14 0553  WBC 13.2* 24.0*  HGB 8.1* 8.8*  HCT 25.4* 27.7*  PLT PLATELET CLUMPS NOTED ON  SMEAR, COUNT APPEARS DECREASED PENDING    BMET   Recent Labs  07/05/14 0530 07/06/14 0552  NA 132* 129*  K 3.3* 3.3*  CL 101 100  CO2 21 21  GLUCOSE 167* 168*  BUN 49* 80*  CREATININE 3.04* 4.59*  CALCIUM 7.4* 7.3*    PT/INR  No results for input(s): LABPROT, INR in the last 72 hours.  ABG  No results for input(s): PHART, HCO3 in the last 72 hours.  Invalid input(s): PCO2, PO2   Studies/Results:  Dg Chest Port 1 View  07/06/2014   CLINICAL DATA:  Respiratory failure.  EXAM: PORTABLE CHEST - 1 VIEW  COMPARISON:  07/04/2014.  FINDINGS: Endotracheal tube, right dialysis catheter, left IJ line, NG tube are in stable position. Mediastinum and hilar structures are stable. Stable cardiomegaly. Persistent left lower lobe atelectasis and/or infiltrate. Mild infiltrate right lung base cannot be excluded. Left pleural effusion cannot be excluded. No pneumothorax.  IMPRESSION: 1. Lines and tubes in stable position. 2. Persistent left lower lobe atelectasis and and infiltrate. Left pleural effusion cannot be excluded. 3. Mild new infiltrate right lung base cannot be excluded.   Electronically Signed   By: Maisie Fushomas  Register   On: 07/06/2014 07:11  Anti-infectives:   Anti-infectives    Start     Dose/Rate Route Frequency Ordered Stop   07/05/14 2000  fluconazole (DIFLUCAN) IVPB 200 mg     200 mg 100 mL/hr over 60 Minutes Intravenous Every 24 hours 07/05/14 0757     07/05/14 1400  piperacillin-tazobactam (ZOSYN) IVPB 2.25 g     2.25 g 100 mL/hr over 30 Minutes Intravenous 3 times per day 07/05/14 0749     07/02/14 1800  vancomycin (VANCOCIN) IVPB 1000 mg/200 mL premix  Status:  Discontinued     1,000 mg 200 mL/hr over 60 Minutes Intravenous Every 24 hours 07/01/14 1858 07/05/14 0801   07/01/14 2200  piperacillin-tazobactam (ZOSYN) IVPB 3.375 g  Status:  Discontinued     3.375 g 100 mL/hr over 30 Minutes Intravenous 4 times per day 07/01/14 1858 07/05/14 0749   07/01/14 2000  fluconazole  (DIFLUCAN) IVPB 400 mg  Status:  Discontinued     400 mg 100 mL/hr over 120 Minutes Intravenous Every 24 hours 07/01/14 1858 07/05/14 0757   07/01/14 1400  fluconazole (DIFLUCAN) IVPB 200 mg  Status:  Discontinued     200 mg 100 mL/hr over 60 Minutes Intravenous Every 24 hours 07/01/14 1330 07/01/14 1853   07/01/14 1000  piperacillin-tazobactam (ZOSYN) IVPB 2.25 g  Status:  Discontinued     2.25 g 100 mL/hr over 30 Minutes Intravenous Every 8 hours 07/01/14 0952 07/01/14 1853   07/01/14 1000  vancomycin (VANCOCIN) 500 mg in sodium chloride 0.9 % 100 mL IVPB     500 mg 100 mL/hr over 60 Minutes Intravenous  Once 07/01/14 0952 07/01/14 1126   06/30/14 1200  vancomycin (VANCOCIN) IVPB 1000 mg/200 mL premix     1,000 mg 200 mL/hr over 60 Minutes Intravenous  Once 06/30/14 0944 06/30/14 1403   06/30/14 1200  ceFEPIme (MAXIPIME) 2 g in dextrose 5 % 50 mL IVPB     2 g 100 mL/hr over 30 Minutes Intravenous  Once 06/30/14 0944 06/30/14 1425   06/30/14 0100  vancomycin (VANCOCIN) 2,000 mg in sodium chloride 0.9 % 500 mL IVPB     2,000 mg 250 mL/hr over 120 Minutes Intravenous  Once 06/30/14 0048 06/30/14 0525   06/30/14 0100  ceFEPIme (MAXIPIME) 2 g in dextrose 5 % 50 mL IVPB     2 g 100 mL/hr over 30 Minutes Intravenous  Once 06/30/14 0048 06/30/14 0322   06/27/14 0600  cefUROXime (ZINACEF) 1.5 g in dextrose 5 % 50 mL IVPB     1.5 g 100 mL/hr over 30 Minutes Intravenous On call to O.R. 06/26/14 1019 06/27/14 0630   06/19/14 1800  oseltamivir (TAMIFLU) capsule 30 mg     30 mg Oral Every M-W-F (1800) 06/19/14 1003 06/22/14 1841   06/18/14 1600  oseltamivir (TAMIFLU) capsule 30 mg  Status:  Discontinued     30 mg Oral Daily 06/18/14 1538 06/19/14 1003      Ovidio Kin, MD, FACS Pager: [phone] [facility] Office: [phone] 07/06/2014

## 2014-07-06 NOTE — Progress Notes (Signed)
[facility] Progress Note   Subjective: awake, no complaint  Filed Vitals:   07/06/14 0900 07/06/14 1000 07/06/14 1107 07/06/14 1130  BP: 83/46 82/41 88/51  102/54  Pulse: 102 102 104 104  Temp:   97.7 F (36.5 C)   TempSrc:   Oral   Resp: 23 24 26 24   Height:      Weight:      SpO2: 99% 98% 100% 97%   Exam: Lethargic, arouses easily, no distress, NG in place No jvd Chest clear bilat RRR no MRG Abd distended, large dressing in place, drain R abd, dec'd BS 2+ pitting dependent edema LE's/hips bilat No UE edema  HD: new start Max inpt weight 106kg, minimum 94.1kg        Assessment: 1. Gastric ulcer w perforation , s/p repair 3/30 - on vanc/ zosyn, pressors 2. S/P acute ACA stroke - left-sided weaknees no chg 3. ESRD new start to HD this admission - using Permcath placed 3/26 d/t AVF problems. When stable VVS wants to considering revision of AVF.  4. 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  No binders d/t low phos 6. Vol excess- d/t TNA. CVP is 10 7. COPD/ tobacco 8. S/P influenza H1N1 9. Severe R HF by TEE 10. DM 11. Hypophos - better after repletion 12. Malnutrition - severe, alb 1.4  Plan - may need to resume CRRT with volume / TNA and hypotension on pressors.     Vinson Moselleob Consuello Lassalle MD  pager [phone]    cell [phone]  07/06/2014, 12:44 PM     Recent Labs Lab 07/04/14 0510 07/05/14 0530 07/06/14 0552  NA 134* 132* 129*  K 3.8 3.3* 3.3*  CL 102 101 100  CO2 27 21 21   GLUCOSE 201* 167* 168*  BUN 23 49* 80*  CREATININE 1.83* 3.04* 4.59*  CALCIUM 7.6* 7.4* 7.3*  PHOS 1.6* 1.5* 3.1    Recent Labs Lab 07/02/14 0430  07/03/14 0445 07/03/14 1555 07/06/14 0552  AST 757*  --  231*  --  25  ALT 455*  --  302*  --  69*  ALKPHOS 65  --  78  --  75  BILITOT 2.8*  --  1.8*  --  1.9*  PROT 5.2*  --  5.3*  --  4.5*  ALBUMIN 2.4*  < > 2.0* 1.9* 1.4*  < > = values in this interval not displayed.  Recent Labs Lab  07/01/14 1813 07/02/14 0430  07/04/14 0510 07/05/14 0530 07/06/14 0553  WBC 11.8*  12.0* 13.6*  < > 14.2* 13.2* 24.0*  NEUTROABS 8.6* 11.4*  --   --   --  21.1*  HGB 10.5*  10.6* 11.1*  < > 8.9* 8.1* 8.8*  HCT 32.9*  32.7* 35.1*  < > 26.9* 25.4* 27.7*  MCV 82.5  81.5 81.3  < > 79.6 83.0 82.4  PLT 125*  108* 116*  < > 48* PLATELET CLUMPS NOTED ON SMEAR, COUNT APPEARS DECREASED 45*  < > = values in this interval not displayed. . chlorhexidine  15 mL Mouth Rinse BID  . darbepoetin (ARANESP) injection - DIALYSIS  100 mcg Intravenous Q Sat-HD  . ferric gluconate (FERRLECIT/NULECIT) IV  125 mg Intravenous Q M,W,F-HD  . fluconazole (DIFLUCAN) IV  200 mg Intravenous Q24H  . insulin aspart  0-9 Units Subcutaneous 6 times per day  . ipratropium  0.5 mg Nebulization Q6H  . levalbuterol  0.63 mg Nebulization Q6H  . pantoprazole (PROTONIX) IV  40 mg Intravenous Q12H  . piperacillin-tazobactam (ZOSYN)  IV  2.25 g Intravenous 3 times per day   . sodium chloride 10 mL/hr at 07/03/14 0900  . norepinephrine (LEVOPHED) Adult infusion 2 mcg/min (07/06/14 1115)  . TPN (CLINIMIX) Adult without lytes 83 mL/hr at 07/05/14 1726   sodium chloride, albuterol, anticoagulant sodium citrate, metoprolol, morphine injection, ondansetron (ZOFRAN) IV

## 2014-07-06 NOTE — Progress Notes (Signed)
Received bedside report from Avalaannah RN at [phone]. CRRT started at 1916  after heparin solution was removed from both red and blue ports.  Patient's oxygen saturations 86 on 4 LNC, increased nasal oxygen to 6 LNC. Instructed the patient to take slow deep breaths. Oxygen sats improved to 90 % with poor waveform. All extremities cool to touch with normal cap refill. Nail polish observed on all fingers and toes. Placed pulse ox on patient's left ear lobe with poor waveform. RT William at bedside at 2010. Both this nurse and RT assessed patient - see doc flow sheet. Dr. Vassie LollAlva made aware of patient's current assessment , lab values, and inability to obtain pulse ox reading at 2023. Orders received to place patient on BiPAP and pull off 50 ml an hour from CRRT machine.

## 2014-07-06 NOTE — Progress Notes (Signed)
Name: Lexine C Eagen MRN: [id number] DOB: 02/02/1956    ADMISSION DATE:  06/17/2014 CONSULTATION DATE:  3/30  REFERRING MD :  Mahala Menghini   CHIEF COMPLAINT:  Hypoxia   BRIEF PATIENT DESCRIPTION: 59 yo female smoker with hx HTN, ESRD on HD (just began this admit), COPD, failed L AV fistula, initially admitted 3/16 with flu, acute hypoxic resp failure.  Ultimately tx to [facility] for worsening metabolic parameters and need for HD.  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  Has made little improvement and on 3/30 has worsening hypoxia, generalized pain, hyperkalemia, worsening leukocytosis and PCCM consulted.     SIGNIFICANT EVENTS  3/16 influenza A+ 3/18 R ACA stroke 3/31 to or ex lap for per pyloric ulcer and sm bowel resection 4/1- septic shock on pressors 4/3-pressors weaned off   STUDIES:  3/19 MRI: multifocal acute infarction of both hemispheres, large right distal ACA 3/22 TEE>>> severe RAE, severely reduced RV function, severe TR, no LAA thrombus  3/30 CT chest/abd: perf bowel 2/2 presumed embolism in setting of ACA, ?rectrovaginal abscess development, LLL collapse  SUBJECTIVE:  Hypotension SBP in 80 (MAP 50-60s) with PSVT, WBC inc to 24   VITAL SIGNS: Temp:  [97.5 F (36.4 C)-98.1 F (36.7 C)] 98.1 F (36.7 C) (04/04 0755) Pulse Rate:  [96-108] 103 (04/04 0800) Resp:  [19-31] 22 (04/04 0800) BP: (70-114)/(32-63) 85/47 mmHg (04/04 0800) SpO2:  [90 %-99 %] 96 % (04/04 0848) Arterial Line BP: (108-116)/(57-61) 116/61 mmHg (04/03 1300) Weight:  [220 lb 10.9 oz (100.1 kg)] 220 lb 10.9 oz (100.1 kg) (04/04 0500)  PHYSICAL EXAMINATION: General:  Chronically ill appearing female, awake and interactive f Neuro: follows commands HEENT:  Mm dry,no JVD  Cardiovascular:  s1s2 rrr Lungs:  resps even non labored , productive cough gray sputum Abdomen:  Round, mildly distended, tender, hypoactive bs , dressing intact c/d/i  Musculoskeletal:  Warm and dry, no  sig edema    Recent Labs Lab 07/04/14 0510 07/05/14 0530 07/06/14 0552  NA 134* 132* 129*  K 3.8 3.3* 3.3*  CL 102 101 100  CO2 BUN 23 49* 80*  CREATININE 1.83* 3.04* 4.59*  GLUCOSE 201* 167* 168*    Recent Labs Lab 07/04/14 0510 07/05/14 0530 07/06/14 0553  HGB 8.9* 8.1* 8.8*  HCT 26.9* 25.4* 27.7*  WBC 14.2* 13.2* 24.0*  PLT 48* PLATELET CLUMPS NOTED ON SMEAR, COUNT APPEARS DECREASED PENDING   Dg Chest Port 1 View  07/06/2014   CLINICAL DATA:  Respiratory failure.  EXAM: PORTABLE CHEST - 1 VIEW  COMPARISON:  07/04/2014.  FINDINGS: Endotracheal tube, right dialysis catheter, left IJ line, NG tube are in stable position. Mediastinum and hilar structures are stable. Stable cardiomegaly. Persistent left lower lobe atelectasis and/or infiltrate. Mild infiltrate right lung base cannot be excluded. Left pleural effusion cannot be excluded. No pneumothorax.  IMPRESSION: 1. Lines and tubes in stable position. 2. Persistent left lower lobe atelectasis and and infiltrate. Left pleural effusion cannot be excluded. 3. Mild new infiltrate right lung base cannot be excluded.   Electronically Signed   By: Maisie Fus  Register   On: 07/06/2014 07:11    ASSESSMENT / PLAN:  Acute hypoxemic respiratory failure - multifactorial initially r/t H1N1, volume overload in setting ESRD.   OTT 3/31=->4/2 COPD  LLL Atelectasis/ collapse ?HCAP  ?OSA Post op respiratory failure -extubated 4/2. -pulmonary toilet -mobilize if ok with ccs, will order PT/OT   CARDIAC  Chronic dCHF - EF 60-65%, grade 1 diastolic dysfunction PSVT resolved. Hypotensive  Plan -  -- Hold anti-HTN -start levo to maintain MAP 65  GI - CDiff neg, denies n/v/d. LFT's ok, s/p exp lap for pyloric ulcer. Plan -  - Per surgery. - NPO. - Protonix. - TPN.  ID ABX post lap, ?hcap CXR with LLL and ?right midlobe infiltrate, there is concern for developing intra-abdominal abscess given perf, leukocytosis with left shift     CT Abd pelvis on 4/4 to r/o intra-abd abscess 3/30 diflucan>> 3/30 pip-taz>> 3/31 vanc>>  3/30 anaerobic>>rare lactobacillus  3/28 bc>>NGTD 3/26 c dif>>neg  Renal: Hyperkalemia   Recent Labs Lab 07/04/14 0510 07/05/14 0530 07/06/14 0552  K 3.8 3.3* 3.3*    ESRD Failed AV fistula - using temp HD cath  Plan -  - pt getting HD per renal appreciate recs - BMET in AM. - Replace electrolytes as indicated. - KVO IVF  Heme:  Thrombocytopenia, clumped platelets still pending this AM.  -cont to monitor -will touch base with surgery in terms of stroke tx (heparin gtt vs asa)  -SCDs  Neuro Acute stroke Follows commands  Plan -  -PT/OT/speech therapy - morphine for pain control.  Pt having episodes of hypotension off pressors. Will restart pressors and get CTabd for ?intra-abd abscess.  Neurologically improving. Start PT/OT/speech therapy.    Christen BameNora Sadek, MD IM PGY-3 Pgr: [phone]  [facility] Pager [phone] till 3 pm If no answer page [phone] 07/06/2014, 9:13 AM  Reviewed above, examined.  She remains with low BP not responsive to fluids.  WBC increased.  She has mild increased WOB.    Chest sounds w/o wheeze, heart rate regular, wound dressing clean.  Concerned she could have additional fluid collection in abdomen.  Will get CT abd/pelvis to further assess.  Will restart levophed.  Continue current Abx and diflucan.  CC time by me independent of resident time is 35 minutes.  Coralyn HellingVineet Jadesola Poynter, MD [facility] 07/06/2014, 1:06 PM Pager:  [phone] After 3pm call: [phone]

## 2014-07-06 NOTE — Progress Notes (Signed)
Patient BP in 80s with MAP 50-58. CCM paged and 500 ml bolus ordered. Bolus given and BP remained unchanged. Will continue to monitor.

## 2014-07-06 NOTE — Progress Notes (Addendum)
PARENTERAL NUTRITION/ANTIBIOTIC CONSULT NOTE - Follow-up  Pharmacy Consult for TPN Indication: prolonged ileus  No Known Allergies  Patient Measurements: Height:  (165.1 cm) Weight: 220 lb 10.9 oz (100.1 kg) IBW/kg (Calculated) : 57  Adjusted body weight: 67  Vital Signs: Temp: 97.8 F (36.6 C) (04/04 0400) Temp Source: Oral (04/04 0400) BP: 96/52 mmHg (04/04 0600) Pulse Rate: 108 (04/04 0600) Intake/Output from previous day: 04/03 0701 - 04/04 0700 In: 2869 [P.O.:30; I.V.:150; NG/GT:20; IV Piggyback:760; TPN:1909] Out: 610 [Emesis/NG output:550; Drains:60] Intake/Output from this shift:    Labs:  Recent Labs  07/04/14 0510 07/05/14 0530 07/06/14 0553  WBC 14.2* 13.2* 24.0*  HGB 8.9* 8.1* 8.8*  HCT 26.9* 25.4* 27.7*  PLT 48* PLATELET CLUMPS NOTED ON SMEAR, COUNT APPEARS DECREASED PENDING  APTT 36  --   --      Recent Labs  07/03/14 1555 07/04/14 0510 07/05/14 0530 07/06/14 0552  NA 136 134* 132* 129*  K 3.8 3.8 3.3* 3.3*  CL 102 102 101 100  CO2 GLUCOSE 158* 201* 167* 168*  BUN 19 23 49* 80*  CREATININE 1.96* 1.83* 3.04* 4.59*  CALCIUM 7.8* 7.6* 7.4* 7.3*  MG  --  2.3 1.9 1.7  PHOS 2.0* 1.6* 1.5* 3.1  PROT  --   --   --  4.5*  ALBUMIN 1.9*  --   --  1.4*  AST  --   --   --  25  ALT  --   --   --  69*  ALKPHOS  --   --   --  75  BILITOT  --   --   --  1.9*   Estimated Creatinine Clearance: 15.6 mL/min (by C-G formula based on Cr of 4.59).    Recent Labs  07/05/14 1934 07/05/14 2343 07/06/14 0522  GLUCAP 152* 105* 157*    Insulin Requirements in the past 24 hours:  7 units Novolog SSI; 10 units regular insulin in TPN  Current Nutrition:  NPO Clinimix 5/15 at 77ml/hr provides 100 gm protein and 1414 kcal  Nutritional Goals: per RD 3/31 1400 kCal, 120-140 grams of protein per day  Assessment: 59 yo F with complicated hospital course. S/p closure perforated pyloric ulcer, SBR for ischemic necrosis, diffuse peritonitis  3/30. Pharmacy consulted to provide TPN for nutrition support.   GI: NPO, bowel rest. No flatus yet, awaiting return of bowel function. TPN. NG o/p 300 ml/24h.Prealbumin from 4/1 still pending -unusual to be pending so long - called lab who had to call outside lab and unsure why not crossing over but results faxed over and prealbumin 5 (low, not surprising with extensive recent surgery)  Endo: CBGs <180. Stress dose steroids tapered off 4/2. SSI and regular insulin in TPN.  Lytes: Na 129. K 3.3 (goal >4 with ileus), Mg 1.7 (goal >2 with ileus), Phos 3.1 - Corr Ca 9.4 (alb 1.4)  Renal: ESRD, new start HD this admission. CRRT off 4/2 ~1400 due to filter clotting. Nephrology plans to transition to IHD on 4/4. I/O positive 2.6L past 24 hrs.  Pulm: Pt extubated 4/2. 2L [place]  Cards: Off pressors. BP soft. HR tachy.  Hepatobil: LFTs slightly elevated but trending down. TG 113.  Neuro: New stroke noted on 3/18.   ID: vanc/zosyn/diflucan for possible PNA and peritonitis. S/p OR for perforated pyloric ulcer, evacuation of ascites and small bowel resection for ischemic necrosis. WBC 13.2, Afeb.  Vancomycin 3/29>  VR 4/3 - 38.5 Cefepime  3/29>3/30 Zosyn 3/30> 3/20 fluconazole>  3/30 peritoneal fluid- rare lactobacillus (no sensitivies performed; usually sensitive to PCN) 3/28 blood x2 - ngtd 3/36 c diff - neg  Best Practices: Lovenox, PPI IV  TPN Access: CVC  TPN day#: 4  Plan:  - Change to Clinimix E 5/15 (with electrolytes) at 83 ml/hr which will meet 100% calorie goals and 83% protein goals. It will be impossible to meet high protein and low kcal goals utilizing pre-made TPN but will maximize protein and minimize calories as much as possible) If patient restarts CRRT, will have to increase Clinimix rate to provide more protein. - Will continue with NO IVFE for first 7 days in ICU patient according to SCCM/ASPEN guidelines. - Plan to add electrolytes to TNA for just one day and reassess  tomorrow. (may only need electrolytes in the TPN a few times per week) - Continue empiric SSI and 10 units regular insulin in TPN - Standard MVI and trace elements daily in TPN - F/u TPN labs, RD consult, Nephro plans of continuing CRRT  Enzo BiNathan Daphanie Oquendo, PharmD Clinical Pharmacist Resident Pager [phone] 07/06/2014 7:28 AM

## 2014-07-06 NOTE — Progress Notes (Signed)
eLink Physician-Brief Progress Note Patient Name: [name] DOB: 1955/07/20 MRN: [id number]   Date of Service  07/06/2014  HPI/Events of Note  Runs of SVT  eICU Interventions  Lopressor prn with parameters Try to taper off levo gtt      Intervention Category Intermediate Interventions: Arrhythmia - evaluation and management  Partick Musselman V. 07/06/2014, 3:49 PM

## 2014-07-06 NOTE — Progress Notes (Signed)
Called nephrology to notify about BP due to plan for dialysis today. Nephrology suggested to hold starting pressors and wait until metoprolol wears off. No new orders given. Will continue to monitor

## 2014-07-06 NOTE — Progress Notes (Addendum)
ANTIBIOTIC CONSULT NOTE - FOLLOW UP  Pharmacy Consult for vancomycin  Indication: pneumonia  No Known Allergies  Patient Measurements: Height: 5\' 5"  (165.1 cm) Weight: 220 lb 10.9 oz (100.1 kg) IBW/kg (Calculated) : 57   Labs:  Recent Labs  07/04/14 0510 07/05/14 0530 07/06/14 0552 07/06/14 0553  WBC 14.2* 13.2*  --  24.0*  HGB 8.9* 8.1*  --  8.8*  PLT 48* PLATELET CLUMPS NOTED ON SMEAR, COUNT APPEARS DECREASED  --  45*  CREATININE 1.83* 3.04* 4.59*  --    Estimated Creatinine Clearance: 15.6 mL/min (by C-G formula based on Cr of 4.59).  Recent Labs  07/05/14 1620  VANCORANDOM 38.5     Assessment: 59 yo female on vancomycin/zosyn and fluconazole for possible PNA and peritonitis. She is now s/p Ex-lap with evacuation of ascites, closure of perforated pyloric ulcer and resection of ileum with primary anatomosis (noted on TPN).   CRRT stopped 4/2 plan to transition to IHD now. WBC= 13., SCr= 3.0 (trend up)   Random vancomycin level 4/3~24 hours after CRRT stopped resulted high at 38.5.   Noted plans to resume CRRT today  Vancomycin 3/29> Cefepime 3/29>3/30 Zosyn 3/30> 3/20 fluconazole>  3/30 peritoneal fluid- rare lactobacillus (no sensitivies performed; usually sensitive to PCN) 3/28 blood x2 - ngtd 3/36 c diff - neg  Goal of Therapy:  Vancomycin trough 15-25  Plan:  Check random vancomycin level with AM labs, redose when level ~ 15 -20 Change Zosyn to 2.25g IV q 6 hrs while on CRRT Change fluconazole to 400 mg q 24 while on CRRT  Tad MooreJessica Maysoon Lozada, Pharm D, BCPS  Clinical Pharmacist Pager [phone]  07/06/2014 3:04 PM

## 2014-07-07 ENCOUNTER — Inpatient Hospital Stay (HOSPITAL_COMMUNITY): Payer: Medicaid Other

## 2014-07-07 LAB — RENAL FUNCTION PANEL
Albumin: 1.4 g/dL — ABNORMAL LOW (ref 3.5–5.2)
Albumin: 1.4 g/dL — ABNORMAL LOW (ref 3.5–5.2)
Anion gap: 6 (ref 5–15)
Anion gap: 9 (ref 5–15)
BUN: 70 mg/dL — AB (ref 6–23)
BUN: 52 mg/dL — AB (ref 6–23)
CHLORIDE: 99 mmol/L (ref 96–112)
CO2: 25 mmol/L (ref 19–32)
CO2: 23 mmol/L (ref 19–32)
Calcium: 7.3 mg/dL — ABNORMAL LOW (ref 8.4–10.5)
Calcium: 7.7 mg/dL — ABNORMAL LOW (ref 8.4–10.5)
Chloride: 95 mmol/L — ABNORMAL LOW (ref 96–112)
Creatinine, Ser: 4.1 mg/dL — ABNORMAL HIGH (ref 0.50–1.10)
Creatinine, Ser: 2.98 mg/dL — ABNORMAL HIGH (ref 0.50–1.10)
GFR calc Af Amer: 13 mL/min — ABNORMAL LOW (ref >90)
GFR calc Af Amer: 19 mL/min — ABNORMAL LOW (ref >90)
GFR, EST NON AFRICAN AMERICAN: 11 mL/min — AB (ref >90)
GFR, EST NON AFRICAN AMERICAN: 16 mL/min — AB (ref >90)
Glucose, Bld: 194 mg/dL — ABNORMAL HIGH (ref 70–99)
Glucose, Bld: 267 mg/dL — ABNORMAL HIGH (ref 70–99)
PHOSPHORUS: 3.8 mg/dL (ref 2.3–4.6)
Phosphorus: 3.9 mg/dL (ref 2.3–4.6)
Potassium: 3.5 mmol/L (ref 3.5–5.1)
Potassium: 3.9 mmol/L (ref 3.5–5.1)
Sodium: 126 mmol/L — ABNORMAL LOW (ref 135–145)
Sodium: 131 mmol/L — ABNORMAL LOW (ref 135–145)

## 2014-07-07 LAB — GLUCOSE, CAPILLARY
GLUCOSE-CAPILLARY: 132 mg/dL — AB (ref 70–99)
GLUCOSE-CAPILLARY: 146 mg/dL — AB (ref 70–99)
GLUCOSE-CAPILLARY: 214 mg/dL — AB (ref 70–99)
Glucose-Capillary: 177 mg/dL — ABNORMAL HIGH (ref 70–99)
Glucose-Capillary: 182 mg/dL — ABNORMAL HIGH (ref 70–99)

## 2014-07-07 LAB — CBC
HEMATOCRIT: 24.6 % — AB (ref 36.0–46.0)
HEMOGLOBIN: 8.2 g/dL — AB (ref 12.0–15.0)
MCH: 26.4 pg (ref 26.0–34.0)
MCHC: 33.3 g/dL (ref 30.0–36.0)
MCV: 79.1 fL (ref 78.0–100.0)
Platelets: 30 10*3/uL — ABNORMAL LOW (ref 150–400)
RBC: 3.11 MIL/uL — AB (ref 3.87–5.11)
RDW: 20.1 % — ABNORMAL HIGH (ref 11.5–15.5)
WBC: 26.4 10*3/uL — ABNORMAL HIGH (ref 4.0–10.5)

## 2014-07-07 LAB — APTT: APTT: 41 s — AB (ref 24–37)

## 2014-07-07 LAB — ANAEROBIC CULTURE

## 2014-07-07 LAB — PREALBUMIN: Prealbumin: 5 mg/dL — ABNORMAL LOW (ref 18.0–45.0)

## 2014-07-07 LAB — HEPARIN INDUCED THROMBOCYTOPENIA PNL: HEPARIN INDUCED PLT AB: 0.533 {OD_unit} — AB (ref 0.000–0.400)

## 2014-07-07 LAB — SEROTONIN RELEASE ASSAY (SRA)
SRA, HIGH DOSE HEPARIN: 2 % (ref 0–20)
SRA, LOW DOSE HEPARIN: 2 % (ref 0–20)

## 2014-07-07 LAB — VANCOMYCIN, RANDOM: VANCOMYCIN RM: 24.7 ug/mL

## 2014-07-07 LAB — MAGNESIUM: Magnesium: 1.9 mg/dL (ref 1.5–2.5)

## 2014-07-07 MED ORDER — TRACE MINERALS CR-CU-F-FE-I-MN-MO-SE-ZN IV SOLN
INTRAVENOUS | Status: AC
  Administered 2014-07-07 (×2): via INTRAVENOUS
  Filled 2014-07-07: qty 2640

## 2014-07-07 MED ORDER — VANCOMYCIN HCL IN DEXTROSE 1-5 GM/200ML-% IV SOLN
1000.0000 mg | INTRAVENOUS | Status: DC
  Administered 2014-07-07 – 2014-07-14 (×7): 1000 mg via INTRAVENOUS
  Filled 2014-07-07 (×9): qty 200

## 2014-07-07 NOTE — Progress Notes (Signed)
PARENTERAL NUTRITION/ANTIBIOTIC CONSULT NOTE - Follow-up  Pharmacy Consult for TPN Indication: prolonged ileus  No Known Allergies  Patient Measurements: Height:  (165.1 cm) Weight: 219 lb 5.7 oz (99.5 kg) IBW/kg (Calculated) : 57  Adjusted body weight: 67  Vital Signs: Temp: 96.5 F (35.8 C) (04/05 0330) Temp Source: Axillary (04/05 0330) BP: 99/63 mmHg (04/05 0700) Pulse Rate: 41 (04/05 0600) Intake/Output from previous day: 04/04 0701 - 04/05 0700 In: 2618.8 [I.V.:299.8; NG/GT:60; IV Piggyback:350; TPN:1909] Out: 2556 [Emesis/NG output:400; Drains:40] Intake/Output from this shift:    Labs:  Recent Labs  07/05/14 0530 07/06/14 0553 07/07/14 0400  WBC 13.2* 24.0*  --   HGB 8.1* 8.8*  --   HCT 25.4* 27.7*  --   PLT PLATELET CLUMPS NOTED ON SMEAR, COUNT APPEARS DECREASED 45*  --   APTT  --   --  41*     Recent Labs  07/05/14 0530 07/06/14 0552 07/06/14 1015 07/06/14 1650 07/07/14 0400  NA 132* 129*  --  126* 126*  K 3.3* 3.3*  --  3.2* 3.5  CL 101 100  --  95* 95*  CO2 21 21  --  18* 25  GLUCOSE 167* 168*  --  208* 194*  BUN 49* 80*  --  92* 70*  CREATININE 3.04* 4.59*  --  5.21* 4.10*  CALCIUM 7.4* 7.3*  --  7.6* 7.3*  MG 1.9 1.7  --   --  1.9  PHOS 1.5* 3.1  --  2.7 3.8  PROT  --  4.5*  --   --   --   ALBUMIN  --  1.4*  --  1.4* 1.4*  AST  --  25  --   --   --   ALT  --  69*  --   --   --   ALKPHOS  --  75  --   --   --   BILITOT  --  1.9*  --   --   --   TRIG  --   --  88  --   --    Estimated Creatinine Clearance: 17.5 mL/min (by C-G formula based on Cr of 4.1).    Recent Labs  07/06/14 1711 07/06/14 2033 07/07/14 0021  GLUCAP 179* 119* 132*    Insulin Requirements in the past 24 hours:  7 units Novolog SSI; 10 units regular insulin in TPN  Current Nutrition:  NPO Clinimix 5/15 at 61ml/hr provides 100 gm protein and 1414 kcal  Nutritional Goals: per RD 4/4 2200-2600 kCal, 120-140 grams of protein per day (maximize  protein)  Assessment: 59 yo F with complicated hospital course. S/p closure perforated pyloric ulcer, SBR for ischemic necrosis, diffuse peritonitis 3/30. Pharmacy consulted to provide TPN for nutrition support.   GI: NPO, bowel rest. No flatus yet, awaiting return of bowel function. TPN. NG o/p 300 ml/24h.Prealbumin from 4/1 still pending -unusual to be pending so long - called lab who had to call outside lab and unsure why not crossing over but results faxed over and prealbumin 5 (low, not surprising with extensive recent surgery)   Endo: CBGs ok (120-210s). Stress dose steroids tapered off 4/2. SSI and regular insulin in TPN.  Lytes: Na trending down to 129. K 3.5 (goal >4 with ileus), Mg 1.9 (goal >2 with ileus), Phos 3.8 - Corr Ca 9.4 (alb 1.4)  Renal: ESRD, new start HD this admission. CRRT off 4/2 ~1400 due to filter clotting. Nephrology restarted CRRT on 4/4. I/O  about equal over past 24 hrs.  Pulm: Pt extubated 4/2. 2L   Cards: On Levophed 577mcg/min. BP soft. HR brady. (NSR)  Hepatobil: LFTs slightly elevated but trending down. TG 113.  Neuro: New stroke noted on 3/18.   ID: vanc/zosyn/diflucan for possible PNA and peritonitis. S/p OR for perforated pyloric ulcer, evacuation of ascites and small bowel resection for ischemic necrosis. WBC back up to 24, Afeb.  Vancomycin 3/29>  VR 4/3 - 38.5 Cefepime 3/29>3/30 Zosyn 3/30> 3/20 fluconazole>  3/30 peritoneal fluid- rare lactobacillus (no sensitivies performed; usually sensitive to PCN) 3/28 blood x2 - ngtd 3/36 c diff - neg  Best Practices: Lovenox, PPI IV  TPN Access: CVC  TPN day#: 5  Plan:  - Change and increase Clinimix to 5/15 (w/o electrolytes) at 110 ml/hr which. This will provide 1874kcal and 132g of protein which will meet 76% calorie goals and 100% protein goals - Will continue with NO IVFE for first 7 days in ICU patient according to SCCM/ASPEN guidelines. - Plan to start Lipids tomorrow on 4/6 - Continue  empiric SSI and increase to 15 units regular insulin in TPN - Standard MVI and trace elements daily in TPN - F/u TPN labs  Enzo BiNathan Traylon Schimming, PharmD Clinical Pharmacist Resident Pager [phone] 07/07/2014 7:08 AM

## 2014-07-07 NOTE — Progress Notes (Signed)
PT Cancellation Note  Patient Details Name: [name] MRN: [id number] DOB: 05-09-1955   Cancelled Treatment:    Reason Eval/Treat Not Completed: Medical issues which prohibited therapy (Pt on CRRT and low dose Levophed with RN not agreeable to mobility at this time and request defer to next date)   Delorse Lekabor, Kayah Hecker Beth 07/07/2014, 11:43 AM Delaney MeigsMaija Tabor Illana Nolting, PT [phone]

## 2014-07-07 NOTE — Progress Notes (Signed)
ANTIBIOTIC CONSULT NOTE - FOLLOW UP  Pharmacy Consult for vancomycin, zosyn, fluconazole Indication: pneumonia  No Known Allergies  Patient Measurements: Height: 5\' 5"  (165.1 cm) Weight: 219 lb 5.7 oz (99.5 kg) IBW/kg (Calculated) : 57   Labs:  Recent Labs  07/05/14 0530 07/06/14 0552 07/06/14 0553 07/06/14 1650 07/07/14 0400 07/07/14 0745  WBC 13.2*  --  24.0*  --   --  26.4*  HGB 8.1*  --  8.8*  --   --  8.2*  PLT PLATELET CLUMPS NOTED ON SMEAR, COUNT APPEARS DECREASED  --  45*  --   --  30*  CREATININE 3.04* 4.59*  --  5.21* 4.10*  --    Estimated Creatinine Clearance: 17.5 mL/min (by C-G formula based on Cr of 4.1).  Recent Labs  07/05/14 1620 07/07/14 0400  VANCORANDOM 38.5 24.7     Assessment: 59 yo female on vancomycin/zosyn and fluconazole for possible PNA and peritonitis. She is now s/p Ex-lap with evacuation of ascites, closure of perforated pyloric ulcer and resection of ileum with primary anatomosis (noted on TPN).   Random vancomycin level 4/3~24 hours after CRRT stopped resulted high at 38.5.   CRRT resumed 4/4 at 1916 pm  Vancomycin 3/29> Cefepime 3/29>3/30 Zosyn 3/30> 3/20 fluconazole>  3/30 peritoneal fluid- rare lactobacillus (no sensitivies performed; usually sensitive to PCN) 3/28 blood x2 - ngtd 3/36 c diff - neg  Goal of Therapy:  Vancomycin trough 15-25  Plan:  Restart vancomycin 1g IV q 24 hrs - next dose at 1200 today Continue Zosyn to 2.25g IV q 6 hrs while on CRRT Continue fluconazole to 400 mg q 24 while on CRRT F/u any plans to stop or hold CRRT.  Tad MooreJessica Harlan Vinal, Pharm D, BCPS  Clinical Pharmacist Pager [phone]  07/07/2014 9:39 AM

## 2014-07-07 NOTE — Progress Notes (Signed)
OT Cancellation Note  Patient Details Name: [name] MRN: [id number] DOB: 05/31/1955   Cancelled Treatment:    Reason Eval/Treat Not Completed: Medical issues which prohibited therapy (Pt currently on CRRT. Will continue to follow.)  [name], Regnald Bowens Lynn 07/07/2014, 9:57 AM

## 2014-07-07 NOTE — Progress Notes (Signed)
Name: [name] MRN: [id number] DOB: Dec 17, 1955    ADMISSION DATE:  06/17/2014 CONSULTATION DATE:  3/30  REFERRING MD :  Mahala Menghini   CHIEF COMPLAINT:  Hypoxia   BRIEF PATIENT DESCRIPTION: 59 yo female smoker with hx HTN, ESRD on HD (just began this admit), COPD, failed L AV fistula, initially admitted 3/16 with flu, acute hypoxic resp failure.  Ultimately tx to [facility] for worsening metabolic parameters and need for HD.  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  Has made little improvement and on 3/30 has worsening hypoxia, generalized pain, hyperkalemia, worsening leukocytosis and PCCM consulted.     SIGNIFICANT EVENTS  3/16 influenza A+ 3/18 R ACA stroke 3/31 to or ex lap for per pyloric ulcer and sm bowel resection 4/1- septic shock on pressors 4/3-pressors weaned off  4/4- restart pressors, CRRT  STUDIES:  3/19 MRI: multifocal acute infarction of both hemispheres, large right distal ACA 3/22 TEE: severe RAE, severely reduced RV function, severe TR, no LAA thrombus  3/30 CT chest/abd: perf bowel 2/2 presumed embolism in setting of ACA, ?rectrovaginal abscess development, LLL collapse 4/4 CT abd/pelvis: small pleural effusion left, no leaking contrast or SBO findings   SUBJECTIVE:  Pt had some runs of SVT and required BIPAP overnight. Pt states feeling better this AM no complaints of dyspnea or CP.   VITAL SIGNS: Temp:  [96 F (35.6 C)-97.9 F (36.6 C)] 97.4 F (36.3 C) (04/05 0730) Pulse Rate:  [25-131] 37 (04/05 0800) Resp:  [13-31] 25 (04/05 0800) BP: (78-137)/(41-77) 137/72 mmHg (04/05 0800) SpO2:  [78 %-100 %] 95 % (04/05 0800) FiO2 (%):  [60 %-80 %] 60 % (04/04 2210) Weight:  [219 lb 5.7 oz (99.5 kg)] 219 lb 5.7 oz (99.5 kg) (04/05 0418)  PHYSICAL EXAMINATION: General:  Chronically ill appearing female, awake and interactive Neuro: follows commands, left arm and leg paralysis HEENT:  MMM, no scleral icterus or conjunctival  injection Cardiovascular: RRR, no rubs murmurs or gallops Lungs:  Slight expiratory wheezes throughout, decreased BS on right side compared to left, coarse BS in left lung base Abdomen:  Round, mildly distended, tender, hypoactive bs , dressing intact c/d/i  Musculoskeletal:  Warm and dry, 1+ pitting edema  CBC Recent Labs     07/05/14  0530  07/06/14  0553  07/07/14  0745  WBC  13.2*  24.0*  26.4*  HGB  8.1*  8.8*  8.2*  HCT  25.4*  27.7*  24.6*  PLT  PLATELET CLUMPS NOTED ON SMEAR, COUNT APPEARS DECREASED  45*  30*    Coag's Recent Labs     07/07/14  0400  APTT  41*    BMET Recent Labs     07/06/14  0552  07/06/14  1650  07/07/14  0400  NA  129*  126*  126*  K  3.3*  3.2*  3.5  CL  100  95*  95*  CO2  21  18*  25  BUN  80*  92*  70*  CREATININE  4.59*  5.21*  4.10*  GLUCOSE  168*  208*  194*    Electrolytes Recent Labs     07/05/14  0530  07/06/14  0552  07/06/14  1650  07/07/14  0400  CALCIUM  7.4*  7.3*  7.6*  7.3*  MG  1.9  1.7   --   1.9  PHOS  1.5*  3.1  2.7  3.8  Liver Enzymes Recent Labs     07/06/14  0552  07/06/14  1650  07/07/14  0400  AST  25   --    --   ALT  69*   --    --   ALKPHOS  75   --    --   BILITOT  1.9*   --    --   ALBUMIN  1.4*  1.4*  1.4*   Glucose Recent Labs     07/06/14  1106  07/06/14  1711  07/06/14  2033  07/07/14  0021  07/07/14  0744  07/07/14  1126  GLUCAP  146*  179*  119*  132*  214*  177*    Imaging Ct Abdomen Pelvis Wo Contrast  07/06/2014   CLINICAL DATA:  Followup surgery for recent small bowel perforation. Elevated white blood cell count.  EXAM: CT ABDOMEN AND PELVIS WITHOUT CONTRAST  TECHNIQUE: Multidetector CT imaging of the abdomen and pelvis was performed following the standard protocol without IV contrast.  COMPARISON:  07/01/2014  FINDINGS: Lower chest: Small left pleural effusion with overlying atelectasis. There is an NG tube in the stomach.  Hepatobiliary: No focal hepatic lesions or  intrahepatic biliary dilatation. The gallbladder demonstrates some high attenuation material which could be vicarious excretion of contrast or sludge.  Pancreas: Grossly normal and stable. Small fatty cleft is noted in the pancreatic head.  Spleen: Normal size.  No focal lesions.  Adrenals/Urinary Tract: The adrenal glands and kidneys are stable.  Stomach/Bowel: The stomach, duodenum, small bowel and colon are unremarkable. No leaking oral contrast. Small amount of free air is likely due to the upper abdominal drainage catheter. Residual free fluid is noted in the abdomen and pelvis likely related to recent surgery. It measures as simple fluid.  Vascular/Lymphatic: No mesenteric or retroperitoneal mass or adenopathy. Stable infrarenal abdominal aortic aneurysm.  Other: Moderate free pelvic fluid. The uterus and ovaries are unremarkable. No pelvic mass or adenopathy. No inguinal adenopathy.  Musculoskeletal: No significant bony findings.  IMPRESSION: Expected postoperative changes. Residual free fluid in the abdomen/ pelvis but no leaking oral contrast or significant free air. No findings for small bowel obstruction.   Electronically Signed   By: Rudie Meyer M.D.   On: 07/06/2014 18:24   Dg Chest Port 1 View  07/07/2014   CLINICAL DATA:  Hypoxia.  EXAM: PORTABLE CHEST - 1 VIEW  COMPARISON:  07/06/2014 and 07/04/2014.  FINDINGS: 0852 hours. The nasogastric tube, right IJ dialysis catheter and left IJ central venous catheter appear unchanged. Stable left hemidiaphragm elevation and left basilar airspace disease. Vascular congestion has increased and there may be mild edema. No pneumothorax or significant pleural effusion identified. The heart size and mediastinal contours are stable.  IMPRESSION: Increased vascular congestion with possible mild pulmonary edema. Otherwise stable examination.   Electronically Signed   By: Carey Bullocks M.D.   On: 07/07/2014 09:06   Dg Chest Port 1 View  07/06/2014   CLINICAL  DATA:  Respiratory failure.  EXAM: PORTABLE CHEST - 1 VIEW  COMPARISON:  07/04/2014.  FINDINGS: Endotracheal tube, right dialysis catheter, left IJ line, NG tube are in stable position. Mediastinum and hilar structures are stable. Stable cardiomegaly. Persistent left lower lobe atelectasis and/or infiltrate. Mild infiltrate right lung base cannot be excluded. Left pleural effusion cannot be excluded. No pneumothorax.  IMPRESSION: 1. Lines and tubes in stable position. 2. Persistent left lower lobe atelectasis and and infiltrate. Left pleural effusion cannot be excluded.  3. Mild new infiltrate right lung base cannot be excluded.   Electronically Signed   By: Maisie Fushomas  Register   On: 07/06/2014 07:11     ASSESSMENT / PLAN: ETT 3/31 >> 4/02  PULMONARY A: Acute hypoxic respiratory failure 2nd to H1N1, hypervolemia, HCAP. Hx of COPD. Atelectasis. P: Oxygen to keep SpO2 > 92% Bronchial hygiene Negative fluid balance as tolerated Scheduled BD's  CARDIAC A: Chronic dCHF - EF 60-65%, grade 1 diastolic dysfunction. SVT runs 4/04. Hypotensive. P: Wean levophed to keep MAP > 65  GASTROENTEROLOGY A: Pyloric ulcer perforation s/p laparotomy P: Post op care per CCS TPN per [facility] Continue protonix  RENAL: A: ESRD P: HD per renal  HEMATOLOGY A: Anemia of critical illness and chronic disease. Thrombocytopenia. P: F/u CBC SCD's  INFECTION A: Peritonitis 2nd to perforated pyloric ulcer >> f/u CT 4/04 unremarkable for abscess. HCAP. P: Day 7 of Abx, currently on zosyn, vancomycin, diflucan  3/30 anaerobic>>rare lactobacillus   ENDOCRINE A: DM type II. P: SSI  NEUROLOGY A: ACA CVA with Lt sided weakness. Deconditioning. P: Off ASA due to thrombocytopenia PT/OT when able   Summary: Continues to need pressors will start to wean. Episode of hypoxia overnight requiring BIPAP, CXR pendings already on Tx for HCAP. CT no intra-abd abscess.  Neurologically improving plan to  start PT/OT/speech therapy when off CRRT.    Christen BameNora Sadek, MD IM PGY-3 Pgr: [phone]  [facility] Pager [phone] till 3 pm If no answer page [phone] 07/07/2014, 8:09 AM  Reviewed above, examined.  She feels weak.  Still requiring pressors.  CT abd/pelvis 4/04 did not show significant abscess.    She has scattered rhonchi, heart sounds regular, wound dressing clean.  She is getting CRRT.  Will continue current Abx, wean pressors to keep MAP > 65, CRRT per renal, continue monitoring in ICU.  CC time by me independent of resident time is 40 minutes.  Coralyn HellingVineet Taeveon Keesling, MD [facility] 07/07/2014, 3:34 PM Pager:  [phone] After 3pm call: [phone]

## 2014-07-07 NOTE — Progress Notes (Signed)
[facility] Progress Note   Subjective: awake, had resp distress last night put on bipap.  Started to pull fluid as is now off bipap.   Filed Vitals:   07/07/14 1400 07/07/14 1500 07/07/14 1541 07/07/14 1600  BP: 107/65 107/65  103/69  Pulse: 90 92  93  Temp:      TempSrc:      Resp: Height:      Weight:      SpO2: 94% 100% 99% 98%   Exam: Lethargic, arouses easily, no distress, NG in place No jvd Chest clear bilat RRR no MRG Abd distended, large dressing in place, drain R abd, dec'd BS 2+ pitting dependent edema LE's/hips bilat No UE edema  HD: new start Max inpt weight 106kg, minimum 94.1kg        Assessment: 1. Gastric ulcer w perforation , s/p repair 3/30 - on vanc/ zosyn, pressors 2. Dyspnea - due to vol excess and early pulm edema. Better now, removing fluid w CRRT 3. S/P acute ACA stroke - left-sided weaknees 4. ESRD (new start to HD this admission - using Permcath placed 3/26 d/t AVF problems. When stable VVS wants to considering revision of AVF).  On CRRT now, day #2 due to vol loading w TNA, hypotension and pressors 5. 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  No binders d/t low phos 7. Vol excess- d/t TNA. CVP is 10 8. COPD/ tobacco 9. S/P influenza H1N1 10. R HF - by TEE  Plan - cont CRRT, ^UF 75 - 150 cc/ hr     [name] MD  pager [phone]    cell [phone]  07/07/2014, 4:04 PM     Recent Labs Lab 07/06/14 0552 07/06/14 1650 07/07/14 0400  NA 129* 126* 126*  K 3.3* 3.2* 3.5  CL 100 95* 95*  CO2 21 18* 25  GLUCOSE 168* 208* 194*  BUN 80* 92* 70*  CREATININE 4.59* 5.21* 4.10*  CALCIUM 7.3* 7.6* 7.3*  PHOS 3.1 2.7 3.8    Recent Labs Lab 07/02/14 0430  07/03/14 0445  07/06/14 0552 07/06/14 1650 07/07/14 0400  AST 757*  --  231*  --  25  --   --   ALT 455*  --  302*  --  69*  --   --   ALKPHOS 65  --  78  --  75  --   --   BILITOT 2.8*  --  1.8*  --  1.9*  --   --   PROT 5.2*  --   5.3*  --  4.5*  --   --   ALBUMIN 2.4*  < > 2.0*  < > 1.4* 1.4* 1.4*  < > = values in this interval not displayed.  Recent Labs Lab 07/01/14 1813 07/02/14 0430  07/05/14 0530 07/06/14 0553 07/07/14 0745  WBC 11.8*  12.0* 13.6*  < > 13.2* 24.0* 26.4*  NEUTROABS 8.6* 11.4*  --   --  21.1*  --   HGB 10.5*  10.6* 11.1*  < > 8.1* 8.8* 8.2*  HCT 32.9*  32.7* 35.1*  < > 25.4* 27.7* 24.6*  MCV 82.5  81.5 81.3  < > 83.0 82.4 79.1  PLT 125*  108* 116*  < > PLATELET CLUMPS NOTED ON SMEAR, COUNT APPEARS DECREASED 45* 30*  < > = values in this interval not displayed. . chlorhexidine  15 mL Mouth Rinse BID  . darbepoetin (ARANESP) injection - DIALYSIS  100 mcg Intravenous Q Sat-HD  . ferric gluconate (FERRLECIT/NULECIT) IV  125 mg Intravenous Q M,W,F-HD  . fluconazole (DIFLUCAN) IV  400 mg Intravenous Q24H  . insulin aspart  0-9 Units Subcutaneous 6 times per day  . ipratropium  0.5 mg Nebulization Q6H  . levalbuterol  0.63 mg Nebulization Q6H  . pantoprazole (PROTONIX) IV  40 mg Intravenous Q12H  . piperacillin-tazobactam (ZOSYN)  IV  2.25 g Intravenous 4 times per day  . vancomycin  1,000 mg Intravenous Q24H   . Marland Kitchen.TPN (CLINIMIX-E) Adult 83 mL/hr at 07/07/14 0800  . sodium chloride Stopped (07/06/14 1700)  . norepinephrine (LEVOPHED) Adult infusion 7 mcg/min (07/07/14 0945)  . dialysis replacement fluid (prismasate) 400 mL/hr at 07/06/14 1916  . dialysis replacement fluid (prismasate) 200 mL/hr at 07/06/14 1916  . dialysate (PRISMASATE) 1,500 mL/hr at 07/07/14 1240  . TPN (CLINIMIX) Adult without lytes     albuterol, anticoagulant sodium citrate, fentaNYL, metoprolol, morphine injection, ondansetron (ZOFRAN) IV, sodium chloride

## 2014-07-07 NOTE — Progress Notes (Signed)
General Surgery Note  LOS: 20 days  POD -  6 Days Post-Op  Assessment/Plan: 1.  EXPLORATORY LAPAROTOMY WITH CLOSURE OF PERFORATED PYLORIC ULCER, SMALL BOWEL RESECTION - 07/01/2014 - Derrell LollingIngram  Diflucan/Zosyn  Has NGT - will need to continue this for now 1B.  Open wound - clean  2.  Leukocytosis  WBC - 26.4 - 07/07/2014  CT 07/06/2014 - no leak or obvious abscess, but she does have a 7+cm fluid collection in the pelvis.  Though this does not look as bad as on the first CT - it may need attention if her   3.  Anemia - 8.2 - 07/07/2014 4.  ESRD  On CVVH 5.  Nutrition - on TPN 6.  Stroke - 06/19/2014  Left hemiplegia - PT ordered 7.  Activity - Needs to ambulate, but limited by multiple medical issues.    Principal Problem:   Acute respiratory failure with hypoxia Active Problems:   Cerebral thrombosis with cerebral infarction   Hypertension   ESRD needing dialysis   Obesity (BMI 30-39.9)   Depression   Left renal mass   Chronic diastolic heart failure   H1N1 influenza   Tobacco use disorder   Renal failure (ARF), acute on chronic   Hyperlipidemia   PSVT (paroxysmal supraventricular tachycardia)   Hypoxia   SOB (shortness of breath)   Perforated gastric ulcer  Subjective:  Does not look as good today.  On CVVH.  Has NGT.  No BM.  Objective:   Filed Vitals:   07/07/14 0800  BP: 137/72  Pulse: 37  Temp:   Resp: 25     Intake/Output from previous day:  04/04 0701 - 04/05 0700 In: 2718.4 [I.V.:316.4; NG/GT:60; IV Piggyback:350; TPN:1992] Out: 2746 [Emesis/NG output:500; Drains:40]  Intake/Output this shift:  Total I/O In: 99.6 [I.V.:16.6; TPN:83] Out: 174 [Other:174]   Physical Exam:   General: [name] who is alert and oriented.    HEENT: Normal.  .   Lungs: Wheezes.  Has right subclavian line.   Abdomen: Soft.  Rare BS.  Drain in RLQ - only 40 cc recorded yesterday   Wound: Clean.   Lab Results:     Recent Labs  07/06/14 0553 07/07/14 0745  WBC 24.0*  26.4*  HGB 8.8* 8.2*  HCT 27.7* 24.6*  PLT 45* 30*    BMET    Recent Labs  07/06/14 1650 07/07/14 0400  NA 126* 126*  K 3.2* 3.5  CL 95* 95*  CO2 18* 25  GLUCOSE 208* 194*  BUN 92* 70*  CREATININE 5.21* 4.10*  CALCIUM 7.6* 7.3*    PT/INR  No results for input(s): LABPROT, INR in the last 72 hours.  ABG  No results for input(s): PHART, HCO3 in the last 72 hours.  Invalid input(s): PCO2, PO2   Studies/Results:  Ct Abdomen Pelvis Wo Contrast  07/06/2014   CLINICAL DATA:  Followup surgery for recent small bowel perforation. Elevated white blood cell count.  EXAM: CT ABDOMEN AND PELVIS WITHOUT CONTRAST  TECHNIQUE: Multidetector CT imaging of the abdomen and pelvis was performed following the standard protocol without IV contrast.  COMPARISON:  07/01/2014  FINDINGS: Lower chest: Small left pleural effusion with overlying atelectasis. There is an NG tube in the stomach.  Hepatobiliary: No focal hepatic lesions or intrahepatic biliary dilatation. The gallbladder demonstrates some high attenuation material which could be vicarious excretion of contrast or sludge.  Pancreas: Grossly normal and stable. Small fatty cleft is noted in the pancreatic head.  Spleen: Normal size.  No focal lesions.  Adrenals/Urinary Tract: The adrenal glands and kidneys are stable.  Stomach/Bowel: The stomach, duodenum, small bowel and colon are unremarkable. No leaking oral contrast. Small amount of free air is likely due to the upper abdominal drainage catheter. Residual free fluid is noted in the abdomen and pelvis likely related to recent surgery. It measures as simple fluid.  Vascular/Lymphatic: No mesenteric or retroperitoneal mass or adenopathy. Stable infrarenal abdominal aortic aneurysm.  Other: Moderate free pelvic fluid. The uterus and ovaries are unremarkable. No pelvic mass or adenopathy. No inguinal adenopathy.  Musculoskeletal: No significant bony findings.  IMPRESSION: Expected postoperative changes.  Residual free fluid in the abdomen/ pelvis but no leaking oral contrast or significant free air. No findings for small bowel obstruction.   Electronically Signed   By: Rudie Meyer M.D.   On: 07/06/2014 18:24   Dg Chest Port 1 View  07/06/2014   CLINICAL DATA:  Respiratory failure.  EXAM: PORTABLE CHEST - 1 VIEW  COMPARISON:  07/04/2014.  FINDINGS: Endotracheal tube, right dialysis catheter, left IJ line, NG tube are in stable position. Mediastinum and hilar structures are stable. Stable cardiomegaly. Persistent left lower lobe atelectasis and/or infiltrate. Mild infiltrate right lung base cannot be excluded. Left pleural effusion cannot be excluded. No pneumothorax.  IMPRESSION: 1. Lines and tubes in stable position. 2. Persistent left lower lobe atelectasis and and infiltrate. Left pleural effusion cannot be excluded. 3. Mild new infiltrate right lung base cannot be excluded.   Electronically Signed   By: Maisie Fus  Register   On: 07/06/2014 07:11     Anti-infectives:   Anti-infectives    Start     Dose/Rate Route Frequency Ordered Stop   07/06/14 2000  fluconazole (DIFLUCAN) IVPB 400 mg     400 mg 100 mL/hr over 120 Minutes Intravenous Every 24 hours 07/06/14 1507     07/06/14 1800  piperacillin-tazobactam (ZOSYN) IVPB 2.25 g     2.25 g 100 mL/hr over 30 Minutes Intravenous 4 times per day 07/06/14 1505     07/05/14 2000  fluconazole (DIFLUCAN) IVPB 200 mg  Status:  Discontinued     200 mg 100 mL/hr over 60 Minutes Intravenous Every 24 hours 07/05/14 0757 07/06/14 1507   07/05/14 1400  piperacillin-tazobactam (ZOSYN) IVPB 2.25 g  Status:  Discontinued     2.25 g 100 mL/hr over 30 Minutes Intravenous 3 times per day 07/05/14 0749 07/06/14 1505   07/02/14 1800  vancomycin (VANCOCIN) IVPB 1000 mg/200 mL premix  Status:  Discontinued     1,000 mg 200 mL/hr over 60 Minutes Intravenous Every 24 hours 07/01/14 1858 07/05/14 0801   07/01/14 2200  piperacillin-tazobactam (ZOSYN) IVPB 3.375 g  Status:   Discontinued     3.375 g 100 mL/hr over 30 Minutes Intravenous 4 times per day 07/01/14 1858 07/05/14 0749   07/01/14 2000  fluconazole (DIFLUCAN) IVPB 400 mg  Status:  Discontinued     400 mg 100 mL/hr over 120 Minutes Intravenous Every 24 hours 07/01/14 1858 07/05/14 0757   07/01/14 1400  fluconazole (DIFLUCAN) IVPB 200 mg  Status:  Discontinued     200 mg 100 mL/hr over 60 Minutes Intravenous Every 24 hours 07/01/14 1330 07/01/14 1853   07/01/14 1000  piperacillin-tazobactam (ZOSYN) IVPB 2.25 g  Status:  Discontinued     2.25 g 100 mL/hr over 30 Minutes Intravenous Every 8 hours 07/01/14 0952 07/01/14 1853   07/01/14 1000  vancomycin (VANCOCIN) 500 mg in sodium  chloride 0.9 % 100 mL IVPB     500 mg 100 mL/hr over 60 Minutes Intravenous  Once 07/01/14 0952 07/01/14 1126   06/30/14 1200  vancomycin (VANCOCIN) IVPB 1000 mg/200 mL premix     1,000 mg 200 mL/hr over 60 Minutes Intravenous  Once 06/30/14 0944 06/30/14 1403   06/30/14 1200  ceFEPIme (MAXIPIME) 2 g in dextrose 5 % 50 mL IVPB     2 g 100 mL/hr over 30 Minutes Intravenous  Once 06/30/14 0944 06/30/14 1425   06/30/14 0100  vancomycin (VANCOCIN) 2,000 mg in sodium chloride 0.9 % 500 mL IVPB     2,000 mg 250 mL/hr over 120 Minutes Intravenous  Once 06/30/14 0048 06/30/14 0525   06/30/14 0100  ceFEPIme (MAXIPIME) 2 g in dextrose 5 % 50 mL IVPB     2 g 100 mL/hr over 30 Minutes Intravenous  Once 06/30/14 0048 06/30/14 0322   06/27/14 0600  cefUROXime (ZINACEF) 1.5 g in dextrose 5 % 50 mL IVPB     1.5 g 100 mL/hr over 30 Minutes Intravenous On call to O.R. 06/26/14 1019 06/27/14 0630   06/19/14 1800  oseltamivir (TAMIFLU) capsule 30 mg     30 mg Oral Every M-W-F (1800) 06/19/14 1003 06/22/14 1841   06/18/14 1600  oseltamivir (TAMIFLU) capsule 30 mg  Status:  Discontinued     30 mg Oral Daily 06/18/14 1538 06/19/14 1003      Ovidio Kin, MD, FACS Pager: [phone] [facility] Office: [phone] 07/07/2014

## 2014-07-08 LAB — CBC WITH DIFFERENTIAL/PLATELET
BASOS ABS: 0 10*3/uL (ref 0.0–0.1)
Basophils Relative: 0 % (ref 0–1)
EOS PCT: 1 % (ref 0–5)
Eosinophils Absolute: 0.2 10*3/uL (ref 0.0–0.7)
HEMATOCRIT: 22.9 % — AB (ref 36.0–46.0)
Hemoglobin: 7.5 g/dL — ABNORMAL LOW (ref 12.0–15.0)
LYMPHS ABS: 0.6 10*3/uL — AB (ref 0.7–4.0)
LYMPHS PCT: 3 % — AB (ref 12–46)
MCH: 26.3 pg (ref 26.0–34.0)
MCHC: 32.8 g/dL (ref 30.0–36.0)
MCV: 80.4 fL (ref 78.0–100.0)
Monocytes Absolute: 1.1 10*3/uL — ABNORMAL HIGH (ref 0.1–1.0)
Monocytes Relative: 5 % (ref 3–12)
NEUTROS ABS: 18.6 10*3/uL — AB (ref 1.7–7.7)
NEUTROS PCT: 91 % — AB (ref 43–77)
Platelets: 42 10*3/uL — ABNORMAL LOW (ref 150–400)
RBC: 2.85 MIL/uL — ABNORMAL LOW (ref 3.87–5.11)
RDW: 20.6 % — AB (ref 11.5–15.5)
WBC: 20.5 10*3/uL — ABNORMAL HIGH (ref 4.0–10.5)

## 2014-07-08 LAB — RENAL FUNCTION PANEL
ALBUMIN: 1.3 g/dL — AB (ref 3.5–5.2)
ALBUMIN: 1.3 g/dL — AB (ref 3.5–5.2)
ANION GAP: 8 (ref 5–15)
Anion gap: 6 (ref 5–15)
BUN: 44 mg/dL — AB (ref 6–23)
BUN: 41 mg/dL — AB (ref 6–23)
CALCIUM: 7.5 mg/dL — AB (ref 8.4–10.5)
CALCIUM: 7.3 mg/dL — AB (ref 8.4–10.5)
CO2: 25 mmol/L (ref 19–32)
CO2: 25 mmol/L (ref 19–32)
CREATININE: 2.44 mg/dL — AB (ref 0.50–1.10)
CREATININE: 2.21 mg/dL — AB (ref 0.50–1.10)
Chloride: 97 mmol/L (ref 96–112)
Chloride: 99 mmol/L (ref 96–112)
GFR calc Af Amer: 27 mL/min — ABNORMAL LOW (ref >90)
GFR, EST AFRICAN AMERICAN: 24 mL/min — AB (ref >90)
GFR, EST NON AFRICAN AMERICAN: 21 mL/min — AB (ref >90)
GFR, EST NON AFRICAN AMERICAN: 23 mL/min — AB (ref >90)
Glucose, Bld: 173 mg/dL — ABNORMAL HIGH (ref 70–99)
Glucose, Bld: 206 mg/dL — ABNORMAL HIGH (ref 70–99)
Phosphorus: 2.3 mg/dL (ref 2.3–4.6)
Phosphorus: 1.7 mg/dL — ABNORMAL LOW (ref 2.3–4.6)
Potassium: 3.5 mmol/L (ref 3.5–5.1)
Potassium: 4 mmol/L (ref 3.5–5.1)
Sodium: 130 mmol/L — ABNORMAL LOW (ref 135–145)
Sodium: 130 mmol/L — ABNORMAL LOW (ref 135–145)

## 2014-07-08 LAB — GLUCOSE, CAPILLARY
GLUCOSE-CAPILLARY: 166 mg/dL — AB (ref 70–99)
Glucose-Capillary: 170 mg/dL — ABNORMAL HIGH (ref 70–99)
Glucose-Capillary: 154 mg/dL — ABNORMAL HIGH (ref 70–99)
Glucose-Capillary: 151 mg/dL — ABNORMAL HIGH (ref 70–99)
Glucose-Capillary: 146 mg/dL — ABNORMAL HIGH (ref 70–99)
Glucose-Capillary: 131 mg/dL — ABNORMAL HIGH (ref 70–99)

## 2014-07-08 LAB — MAGNESIUM: Magnesium: 2 mg/dL (ref 1.5–2.5)

## 2014-07-08 MED ORDER — POTASSIUM CHLORIDE 10 MEQ/100ML IV SOLN
10.0000 meq | INTRAVENOUS | Status: AC
  Administered 2014-07-08 (×3): 10 meq via INTRAVENOUS
  Filled 2014-07-08 (×3): qty 100

## 2014-07-08 MED ORDER — DEXTROSE 5 % IV SOLN
30.0000 ug/min | INTRAVENOUS | Status: DC
  Administered 2014-07-08: 100 ug/min via INTRAVENOUS
  Filled 2014-07-08: qty 1

## 2014-07-08 MED ORDER — TRACE MINERALS CR-CU-F-FE-I-MN-MO-SE-ZN IV SOLN
INTRAVENOUS | Status: AC
  Administered 2014-07-08: 18:00:00 via INTRAVENOUS
  Filled 2014-07-08: qty 2640

## 2014-07-08 MED ORDER — FAT EMULSION 20 % IV EMUL
240.0000 mL | INTRAVENOUS | Status: AC
  Administered 2014-07-08: 240 mL via INTRAVENOUS
  Filled 2014-07-08: qty 250

## 2014-07-08 MED ORDER — DARBEPOETIN ALFA 150 MCG/0.3ML IJ SOSY
150.0000 ug | PREFILLED_SYRINGE | INTRAMUSCULAR | Status: DC
  Administered 2014-07-11: 150 ug via INTRAVENOUS
  Filled 2014-07-08 (×3): qty 0.3

## 2014-07-08 NOTE — Progress Notes (Signed)
eLink Physician-Brief Progress Note Patient Name: [name] DOB: 06/21/1955 MRN: [id number]   Date of Service  07/08/2014  HPI/Events of Note  Runs of SVT, on levo gtt  eICU Interventions  CBC stat Use neo gtt & taper off levo gtt     Intervention Category Major Interventions: Arrhythmia - evaluation and management  Rexanna Louthan V. 07/08/2014, 10:20 PM

## 2014-07-08 NOTE — Progress Notes (Signed)
07/08/2014 1715  freq runs of SVT which spontaneously converts back to NR/ST  Veto Macqueen, [name]

## 2014-07-08 NOTE — Progress Notes (Signed)
07/08/2014 0818  SVT rate 150-167. Dr. Craige Cottasood here. Spontaneously converted back to SR rate 90's [name], [name]

## 2014-07-08 NOTE — Progress Notes (Signed)
PARENTERAL NUTRITION/ANTIBIOTIC CONSULT NOTE - Follow-up  Pharmacy Consult for TPN Indication: prolonged ileus  No Known Allergies  Patient Measurements: Height:  (165.1 cm) Weight: 215 lb 13.3 oz (97.9 kg) IBW/kg (Calculated) : 57  Adjusted body weight: 67  Vital Signs: Temp: 97.4 F (36.3 C) (04/06 0718) Temp Source: Oral (04/06 0718) BP: 121/56 mmHg (04/06 0718) Pulse Rate: 95 (04/05 1943) Intake/Output from previous day: 04/05 0701 - 04/06 0700 In: 3293.7 [I.V.:357; NG/GT:50; IV Piggyback:600; TPN:2286.7] Out: 5261 [Emesis/NG output:450; Drains:15] Intake/Output from this shift:    Labs:  Recent Labs  07/06/14 0553 07/07/14 0400 07/07/14 0745 07/08/14 0434  WBC 24.0*  --  26.4* 20.5*  HGB 8.8*  --  8.2* 7.5*  HCT 27.7*  --  24.6* 22.9*  PLT 45*  --  30* PENDING  APTT  --  41*  --   --      Recent Labs  07/06/14 0552 07/06/14 1015  07/07/14 0400 07/07/14 1555 07/08/14 0434 07/08/14 0447  NA 129*  --   < > 126* 131*  --  130*  K 3.3*  --   < > 3.5 3.9  --  3.5  CL 100  --   < > 95* 99  --  97  CO2 21  --   < > 25 23  --  25  GLUCOSE 168*  --   < > 194* 267*  --  173*  BUN 80*  --   < > 70* 52*  --  44*  CREATININE 4.59*  --   < > 4.10* 2.98*  --  2.44*  CALCIUM 7.3*  --   < > 7.3* 7.7*  --  7.5*  MG 1.7  --   --  1.9  --  2.0  --   PHOS 3.1  --   < > 3.8 3.9  --  2.3  PROT 4.5*  --   --   --   --   --   --   ALBUMIN 1.4*  --   < > 1.4* 1.4*  --  1.3*  AST 25  --   --   --   --   --   --   ALT 69*  --   --   --   --   --   --   ALKPHOS 75  --   --   --   --   --   --   BILITOT 1.9*  --   --   --   --   --   --   TRIG  --  88  --   --   --   --   --   < > = values in this interval not displayed. Estimated Creatinine Clearance: 29.1 mL/min (by C-G formula based on Cr of 2.44).    Recent Labs  07/07/14 2359 07/08/14 0415 07/08/14 0709  GLUCAP 166* 170* 154*    Insulin Requirements in the past 24 hours:  12 units Novolog SSI; 15 units  regular insulin in TPN  Current Nutrition:  NPO Clinimix to 5/15 (w/o electrolytes) at 110 ml/hr which. This will provide 1874kcal and 132g of protein which will meet 76% calorie goals and 100% protein goals  Nutritional Goals: per RD 4/4 2200-2600 kCal, 120-140 grams of protein per day (maximize protein)  Assessment: 59 yo F with complicated hospital course. S/p closure perforated pyloric ulcer, SBR for ischemic necrosis, diffuse peritonitis 3/30. Pharmacy consulted to provide  TPN for nutrition support.   GI: NPO, bowel rest. No flatus yet, awaiting return of bowel function. TPN. NG o/p 450 ml/24h. Prealbumin from 4/1 is 5. 4/4 prealbumin in process   Endo: CBGs slightly elevated (150-180s) Noted serum glucose of 267 but may be lab error with CBG 180 10 min apart. SSI and regular insulin in TPN.  Lytes: Na at 130. K 3.5 (goal >4 with ileus), Mg 2.0 (goal >2 with ileus), Phos 2.3 - Corr Ca 9.7   Renal: ESRD, new start HD this admission. CRRT off 4/2 ~1400 due to filter clotting. Nephrology restarted CRRT on 4/4. I/O net loss of 2L yesterday. MIVF: NS @ 2810ml/hr  Pulm: Pt extubated 4/2. 4L [place]  Cards: On Levophed 518mcg/min. BP ok. HR 90s. (NSR)  Hepatobil: LFTs slightly elevated but trending down. TG 88. Albumin 1.3  Neuro: New stroke noted on 3/18.   ID: vanc/zosyn/diflucan for possible PNA and peritonitis. S/p OR for perforated pyloric ulcer, evacuation of ascites and small bowel resection for ischemic necrosis. WBC down to 20.5, Afeb.  Vancomycin 3/29>  VR 4/3 - 38.5 Cefepime 3/29>3/30 Zosyn 3/30> 3/20 fluconazole>  3/30 peritoneal fluid- rare lactobacillus (no sensitivies performed; usually sensitive to PCN) 3/28 blood x2 - ngtd 3/36 c diff - neg  Best Practices: Lovenox, PPI IV  TPN Access: CVC  TPN day#: 6  Plan:  - Clinimix 5/15 (w/o electrolytes) at 110 ml/hr and start IVFE 20% at 7410ml/hr. This will provide ~2350 kcal and 132g of protein which will meet 100% of  calorie and protein goals - Continue empiric SSI and increase to 20 units regular insulin in TPN - Standard MVI and trace elements daily in TPN - K replacement per renal - F/u TPN labs  Enzo BiNathan Chloey Ricard, PharmD Clinical Pharmacist Resident Pager [phone] 07/08/2014 7:22 AM

## 2014-07-08 NOTE — Progress Notes (Signed)
Bipap on standby at this time. 

## 2014-07-08 NOTE — Progress Notes (Signed)
PT Cancellation Note  Patient Details Name: [name] MRN: [id number] DOB: 1955/07/11   Cancelled Treatment:    Reason Eval/Treat Not Completed: Medical issues which prohibited therapy (Pt on CRRT and low dose Levophed with RN Rose not agreeable to mobility at this time and request defer to next date).   [name], [name] 07/08/2014, 9:39 AM  Eber Jonesawn [name],PT Acute Rehabilitation [phone] (pager)

## 2014-07-08 NOTE — Progress Notes (Signed)
OT Cancellation Note  Patient Details Name: [name] MRN: [id number] DOB: [birth year]   Cancelled Treatment:    Reason Eval/Treat Not Completed: Medical issues which prohibited therapy. Pt on CRRT and low dose Levophed with RN Rose not appropriate for mobility.  Will continue to follow.  [name], Rashunda Passon Lynn 07/08/2014, 9:45 AM

## 2014-07-08 NOTE — Progress Notes (Signed)
eLink Physician-Brief Progress Note Patient Name: [name] DOB: 1955-07-27 MRN: [id number]   Date of Service  07/08/2014  HPI/Events of Note  Hgb 7.0, not bleeding  eICU Interventions  Monitor for bleeding Transfuse when Hgb < 7.0     Intervention Category Intermediate Interventions: Diagnostic test evaluation  Bowman Higbie 07/08/2014, 11:38 PM

## 2014-07-08 NOTE — Progress Notes (Signed)
Name: [name] MRN: [id number] DOB: [birth year]    ADMISSION DATE:  06/17/2014 CONSULTATION DATE:  3/30  REFERRING MD :  Mahala Menghini   CHIEF COMPLAINT:  Hypoxia   BRIEF PATIENT DESCRIPTION: 59 yo female smoker with hx HTN, ESRD on HD (just began this admit), COPD, failed L AV fistula, initially admitted 3/16 with flu, acute hypoxic resp failure.  Ultimately tx to [facility] for worsening metabolic parameters and need for HD.  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  Has made little improvement and on 3/30 has worsening hypoxia, generalized pain, hyperkalemia, worsening leukocytosis and PCCM consulted.     SIGNIFICANT EVENTS  3/16 influenza A+ 3/18 R ACA stroke 3/31 to or ex lap for per pyloric ulcer and sm bowel resection 4/1- septic shock on pressors 4/3-pressors weaned off  4/4- restart pressors, CRRT 4/5- run of SVTs  STUDIES:  3/19 MRI: multifocal acute infarction of both hemispheres, large right distal ACA 3/22 TEE: severe RAE, severely reduced RV function, severe TR, no LAA thrombus  3/30 CT chest/abd: perf bowel 2/2 presumed embolism in setting of ACA, ?rectrovaginal abscess development, LLL collapse 4/4 CT abd/pelvis: small pleural effusion left, no leaking contrast or SBO findings   SUBJECTIVE:  Pt had some more runs of SVT. Not feeling well this AM. Complaining of left eye dryness   VITAL SIGNS: Temp:  [97.4 F (36.3 C)-97.8 F (36.6 C)] 97.4 F (36.3 C) (04/06 0718) Pulse Rate:  [35-97] 95 (04/05 1943) Resp:  [14-32] 22 (04/06 0831) BP: (81-128)/(27-82) 102/56 mmHg (04/06 0831) SpO2:  [94 %-100 %] 96 % (04/06 0831) Weight:  [215 lb 13.3 oz (97.9 kg)] 215 lb 13.3 oz (97.9 kg) (04/06 0500)  PHYSICAL EXAMINATION: General:  Chronically ill appearing female, awake and interactive Neuro: follows commands, left arm and leg paralysis HEENT:  MMM, no scleral icterus or conjunctival injection Cardiovascular: RRR, no rubs murmurs or  gallops Lungs:  Slight expiratory wheezes throughout, decreased BS on right side compared to left, coarse BS in left lung base Abdomen:  Round, mildly distended, tender, hypoactive bs , dressing intact c/d/i  Musculoskeletal:  Warm and dry, 1+ pitting edema  CBC Recent Labs     07/06/14  0553  07/07/14  0745  07/08/14  0434  WBC  24.0*  26.4*  20.5*  HGB  8.8*  8.2*  7.5*  HCT  27.7*  24.6*  22.9*  PLT  45*  30*  42*    Coag's Recent Labs     07/07/14  0400  APTT  41*    BMET Recent Labs     07/07/14  0400  07/07/14  1555  07/08/14  0447  NA  126*  131*  130*  K  3.5  3.9  3.5  CL  95*  99  97  CO2  BUN  70*  52*  44*  CREATININE  4.10*  2.98*  2.44*  GLUCOSE  194*  267*  173*    Electrolytes Recent Labs     07/06/14  0552   07/07/14  0400  07/07/14  1555  07/08/14  0434  07/08/14  0447  CALCIUM  7.3*   < >  7.3*  7.7*   --   7.5*  MG  1.7   --   1.9   --   2.0   --   PHOS  3.1   < >  3.8  3.9   --   2.3   < > = values in this interval not displayed.    Liver Enzymes Recent Labs     07/06/14  0552   07/07/14  0400  07/07/14  1555  07/08/14  0447  AST  25   --    --    --    --   ALT  69*   --    --    --    --   ALKPHOS  75   --    --    --    --   BILITOT  1.9*   --    --    --    --   ALBUMIN  1.4*   < >  1.4*  1.4*  1.3*   < > = values in this interval not displayed.   Glucose Recent Labs     07/07/14  1126  07/07/14  1548  07/07/14  1947  07/07/14  2359  07/08/14  0415  07/08/14  0709  GLUCAP  177*  182*  146*  166*  170*  154*    Imaging Ct Abdomen Pelvis Wo Contrast  07/06/2014   CLINICAL DATA:  Followup surgery for recent small bowel perforation. Elevated white blood cell count.  EXAM: CT ABDOMEN AND PELVIS WITHOUT CONTRAST  TECHNIQUE: Multidetector CT imaging of the abdomen and pelvis was performed following the standard protocol without IV contrast.  COMPARISON:  07/01/2014  FINDINGS: Lower chest: Small left pleural  effusion with overlying atelectasis. There is an NG tube in the stomach.  Hepatobiliary: No focal hepatic lesions or intrahepatic biliary dilatation. The gallbladder demonstrates some high attenuation material which could be vicarious excretion of contrast or sludge.  Pancreas: Grossly normal and stable. Small fatty cleft is noted in the pancreatic head.  Spleen: Normal size.  No focal lesions.  Adrenals/Urinary Tract: The adrenal glands and kidneys are stable.  Stomach/Bowel: The stomach, duodenum, small bowel and colon are unremarkable. No leaking oral contrast. Small amount of free air is likely due to the upper abdominal drainage catheter. Residual free fluid is noted in the abdomen and pelvis likely related to recent surgery. It measures as simple fluid.  Vascular/Lymphatic: No mesenteric or retroperitoneal mass or adenopathy. Stable infrarenal abdominal aortic aneurysm.  Other: Moderate free pelvic fluid. The uterus and ovaries are unremarkable. No pelvic mass or adenopathy. No inguinal adenopathy.  Musculoskeletal: No significant bony findings.  IMPRESSION: Expected postoperative changes. Residual free fluid in the abdomen/ pelvis but no leaking oral contrast or significant free air. No findings for small bowel obstruction.   Electronically Signed   By: Rudie Meyer M.D.   On: 07/06/2014 18:24   Dg Chest Port 1 View  07/07/2014   CLINICAL DATA:  Hypoxia.  EXAM: PORTABLE CHEST - 1 VIEW  COMPARISON:  07/06/2014 and 07/04/2014.  FINDINGS: 0852 hours. The nasogastric tube, right IJ dialysis catheter and left IJ central venous catheter appear unchanged. Stable left hemidiaphragm elevation and left basilar airspace disease. Vascular congestion has increased and there may be mild edema. No pneumothorax or significant pleural effusion identified. The heart size and mediastinal contours are stable.  IMPRESSION: Increased vascular congestion with possible mild pulmonary edema. Otherwise stable examination.    Electronically Signed   By: Carey Bullocks M.D.   On: 07/07/2014 09:06     ASSESSMENT / PLAN: ETT 3/31 >> 4/02  PULMONARY A: Acute hypoxic respiratory failure 2nd to H1N1, hypervolemia, HCAP.  Hx of COPD. Atelectasis. P: Oxygen to keep SpO2 > 92% Bronchial hygiene Negative fluid balance as tolerated Scheduled BD's  CARDIAC A: Chronic dCHF - EF 60-65%, grade 1 diastolic dysfunction. SVT runs 4/04. Hypotensive. P: Wean levophed to keep MAP > 65 Pt spontaneously converting back to NSR will continue to monitor for now   GASTROENTEROLOGY A: Pyloric ulcer perforation s/p laparotomy P: Post op care per [facility] TPN per [facility] Continue protonix Prolonged ileus with no flatus or BM, reviewed CT images with surgery fluid collection doesn't appear to be abscess and WBCs trending down  RENAL: A: ESRD P: CVVH beginning to pull fluid  HEMATOLOGY A: Anemia of critical illness and chronic disease. Thrombocytopenia. Pltlt 42. 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 HCAP. P: Day 8 of Abx, currently on zosyn, vancomycin, diflucan  3/30 anaerobic>>rare lactobacillus   ENDOCRINE A: DM type II. P: SSI  NEUROLOGY A: ACA CVA with Lt sided weakness. Deconditioning. P: Off ASA due to thrombocytopenia PT/OT when able   Summary: Continues to need pressors, pt on CRRT, continue to monitor platelets less likely HIT.   Christen BameNora Sadek, MD IM PGY-3 Pgr: [phone]  Reviewed above, examined.    She remains tenuous.  Still on CRRT and pressors.  She feels weak.  Scattered rhonchi, heart rate regular but had episodes of SVT, wound site clean.  Serotonin release assay negative >> monitor PLT count.  Continue Abx.  Wean off pressors as tolerated.  Continue CRRT until hemodyamics more stable.  Post op  caer/nutrition per [facility]CS.  CC time by me independent of resident time is 35 minutes.  Coralyn HellingVineet Tatelyn Vanhecke, MD [facility] Pulmonary/[facility] 07/08/2014, 3:06 PM Pager:  [phone] After 3pm call: [phone]

## 2014-07-08 NOTE — Progress Notes (Signed)
[facility] Progress Note   Subjective: no resp problems overnight. BP's soft, using pressors. Wt down 1kg  Filed Vitals:   07/08/14 0800 07/08/14 0831 07/08/14 0900 07/08/14 0921  BP: 96/60 102/56 99/69   Pulse:      Temp:      TempSrc:      Resp: Height:      Weight:      SpO2: 94% 96% 94% 95%   Exam: Lethargic, arouses easily, no distress, NG in place No jvd Chest clear bilat RRR no MRG Abd distended, large dressing in place, drain R abd, dec'd BS 2+ pitting dependent edema LE's/hips bilat No UE edema  HD: new start Max inpt weight 106kg, minimum 94.1kg        Assessment: 1. Gastric ulcer w perforation , s/p repair 3/30 - on vanc/ zosyn, pressors 2. Pulm edema - improving 3. S/P acute ACA stroke - left-sided weaknees 4. ESRD new start to HD; cont CRRT 5. HD access - using Permcath placed 3/26 d/t AVF problems. When stable VVS wants to considering revision of AVF 6. Anemia ESA started, IV Fe 7. 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  No binders d/t low phos 8. Vol excess- d/t TNA. CVP is 10, UF 50 cc/ hr on pressors 9. COPD/ tobacco 10. S/P influenza H1N1 11. R HF - by TEE  Plan - cont CRRT    [name] MD  pager [phone]    cell [phone]  07/08/2014, 9:36 AM     Recent Labs Lab 07/07/14 0400 07/07/14 1555 07/08/14 0447  NA 126* 131* 130*  K 3.5 3.9 3.5  CL 95* 99 97  CO2 GLUCOSE 194* 267* 173*  BUN 70* 52* 44*  CREATININE 4.10* 2.98* 2.44*  CALCIUM 7.3* 7.7* 7.5*  PHOS 3.8 3.9 2.3    Recent Labs Lab 07/02/14 0430  07/03/14 0445  07/06/14 0552  07/07/14 0400 07/07/14 1555 07/08/14 0447  AST 757*  --  231*  --  25  --   --   --   --   ALT 455*  --  302*  --  69*  --   --   --   --   ALKPHOS 65  --  78  --  75  --   --   --   --   BILITOT 2.8*  --  1.8*  --  1.9*  --   --   --   --   PROT 5.2*  --  5.3*  --  4.5*  --   --   --   --   ALBUMIN 2.4*  < > 2.0*  < > 1.4*  < > 1.4* 1.4* 1.3*  < > = values in  this interval not displayed.  Recent Labs Lab 07/02/14 0430  07/06/14 0553 07/07/14 0745 07/08/14 0434  WBC 13.6*  < > 24.0* 26.4* 20.5*  NEUTROABS 11.4*  --  21.1*  --  18.6*  HGB 11.1*  < > 8.8* 8.2* 7.5*  HCT 35.1*  < > 27.7* 24.6* 22.9*  MCV 81.3  < > 82.4 79.1 80.4  PLT 116*  < > 45* 30* 42*  < > = values in this interval not displayed. . chlorhexidine  15 mL Mouth Rinse BID  . [START ON 07/11/2014] darbepoetin (ARANESP) injection - DIALYSIS  150 mcg Intravenous Q Sat-HD  . ferric gluconate (FERRLECIT/NULECIT) IV  125 mg Intravenous Q M,W,F-HD  . fluconazole (  DIFLUCAN) IV  400 mg Intravenous Q24H  . insulin aspart  0-9 Units Subcutaneous 6 times per day  . ipratropium  0.5 mg Nebulization Q6H  . levalbuterol  0.63 mg Nebulization Q6H  . pantoprazole (PROTONIX) IV  40 mg Intravenous Q12H  . piperacillin-tazobactam (ZOSYN)  IV  2.25 g Intravenous 4 times per day  . potassium chloride  10 mEq Intravenous Q1 Hr x 3  . vancomycin  1,000 mg Intravenous Q24H   . sodium chloride 10 mL/hr at 07/08/14 0630  . TPN (CLINIMIX) Adult without lytes     And  . fat emulsion    . norepinephrine (LEVOPHED) Adult infusion 8 mcg/min (07/08/14 0700)  . dialysis replacement fluid (prismasate) 400 mL/hr at 07/08/14 0916  . dialysis replacement fluid (prismasate) 200 mL/hr at 07/07/14 2100  . dialysate (PRISMASATE) 1,500 mL/hr at 07/08/14 0856  . TPN (CLINIMIX) Adult without lytes 110 mL/hr at 07/07/14 2000   albuterol, anticoagulant sodium citrate, fentaNYL, metoprolol, morphine injection, ondansetron (ZOFRAN) IV, sodium chloride

## 2014-07-08 NOTE — Progress Notes (Signed)
[name], female   DOB: 10/10/1955, 59 y.o.   MRN: [id number] 7 Days Post-Op  Subjective: Pt sedative from recent pain med.  Back on CRRT, levo increased this am to 8mcg.  Objective: Vital signs in last 24 hours: Temp:  [97.4 F (36.3 C)-97.8 F (36.6 C)] 97.4 F (36.3 C) (04/06 0718) Pulse Rate:  [35-97] 95 (04/05 1943) Resp:  [14-32] 22 (04/06 0718) BP: (81-137)/(27-82) 121/56 mmHg (04/06 0718) SpO2:  [94 %-100 %] 96 % (04/06 0718) Weight:  [97.9 kg (215 lb 13.3 oz)] 97.9 kg (215 lb 13.3 oz) (04/06 0500) Last BM Date: 06/28/14  Intake/Output from previous day: 04/05 0701 - 04/06 0700 In: 3293.7 [I.V.:357; NG/GT:50; IV Piggyback:600; TPN:2286.7] Out: 5261 [Emesis/NG output:450; Drains:15] Intake/Output this shift:    PE: Abd: soft, midline wound is clean, fascia is intact.  JP drain with minimal serosang output. Absent BS  Lab Results:   Recent Labs  07/07/14 0745 07/08/14 0434  WBC 26.4* 20.5*  HGB 8.2* 7.5*  HCT 24.6* 22.9*  PLT 30* PENDING   BMET  Recent Labs  07/07/14 1555 07/08/14 0447  NA 131* 130*  K 3.9 3.5  CL 99 97  CO2 23 25  GLUCOSE 267* 173*  BUN 52* 44*  CREATININE 2.98* 2.44*  CALCIUM 7.7* 7.5*   PT/INR No results for input(s): LABPROT, INR in the last 72 hours. CMP     Component Value Date/Time   NA 130* 07/08/2014 0447   NA 139 10/02/2012 1046   K 3.5 07/08/2014 0447   K 4.4 10/02/2012 1046   CL 97 07/08/2014 0447   CO2 25 07/08/2014 0447   CO2 25 10/02/2012 1046   GLUCOSE 173* 07/08/2014 0447   GLUCOSE 110 10/02/2012 1046   BUN 44* 07/08/2014 0447   BUN 35.2* 10/02/2012 1046   CREATININE 2.44* 07/08/2014 0447   CREATININE 2.5* 10/02/2012 1046   CALCIUM 7.5* 07/08/2014 0447   CALCIUM 9.8 10/02/2012 1046   PROT 4.5* 07/06/2014 0552   PROT 8.4* 10/02/2012 1046   ALBUMIN 1.3* 07/08/2014 0447   ALBUMIN 3.1* 10/02/2012 1046   AST 25 07/06/2014 0552   AST 9 10/02/2012 1046   ALT 69* 07/06/2014 0552   ALT <6  Repeated and Verified 10/02/2012 1046   ALKPHOS 75 07/06/2014 0552   ALKPHOS 108 10/02/2012 1046   BILITOT 1.9* 07/06/2014 0552   BILITOT 0.21 10/02/2012 1046   GFRNONAA 21* 07/08/2014 0447   GFRAA 24* 07/08/2014 0447   Lipase  No results found for: LIPASE     Studies/Results: Ct Abdomen Pelvis Wo Contrast  07/06/2014   CLINICAL DATA:  Followup surgery for recent small bowel perforation. Elevated white blood cell count.  EXAM: CT ABDOMEN AND PELVIS WITHOUT CONTRAST  TECHNIQUE: Multidetector CT imaging of the abdomen and pelvis was performed following the standard protocol without IV contrast.  COMPARISON:  07/01/2014  FINDINGS: Lower chest: Small left pleural effusion with overlying atelectasis. There is an NG tube in the stomach.  Hepatobiliary: No focal hepatic lesions or intrahepatic biliary dilatation. The gallbladder demonstrates some high attenuation material which could be vicarious excretion of contrast or sludge.  Pancreas: Grossly normal and stable. Small fatty cleft is noted in the pancreatic head.  Spleen: Normal size.  No focal lesions.  Adrenals/Urinary Tract: The adrenal glands and kidneys are stable.  Stomach/Bowel: The stomach, duodenum, small bowel and colon are unremarkable. No leaking oral contrast. Small amount of free air is likely due to the upper abdominal  drainage catheter. Residual free fluid is noted in the abdomen and pelvis likely related to recent surgery. It measures as simple fluid.  Vascular/Lymphatic: No mesenteric or retroperitoneal mass or adenopathy. Stable infrarenal abdominal aortic aneurysm.  Other: Moderate free pelvic fluid. The uterus and ovaries are unremarkable. No pelvic mass or adenopathy. No inguinal adenopathy.  Musculoskeletal: No significant bony findings.  IMPRESSION: Expected postoperative changes. Residual free fluid in the abdomen/ pelvis but no leaking oral contrast or significant free air. No findings for small bowel obstruction.    Electronically Signed   By: Rudie Meyer M.D.   On: 07/06/2014 18:24   Dg Chest Port 1 View  07/07/2014   CLINICAL DATA:  Hypoxia.  EXAM: PORTABLE CHEST - 1 VIEW  COMPARISON:  07/06/2014 and 07/04/2014.  FINDINGS: 0852 hours. The nasogastric tube, right IJ dialysis catheter and left IJ central venous catheter appear unchanged. Stable left hemidiaphragm elevation and left basilar airspace disease. Vascular congestion has increased and there may be mild edema. No pneumothorax or significant pleural effusion identified. The heart size and mediastinal contours are stable.  IMPRESSION: Increased vascular congestion with possible mild pulmonary edema. Otherwise stable examination.   Electronically Signed   By: Carey Bullocks M.D.   On: 07/07/2014 09:06    Anti-infectives: Anti-infectives    Start     Dose/Rate Route Frequency Ordered Stop   07/07/14 1200  vancomycin (VANCOCIN) IVPB 1000 mg/200 mL premix     1,000 mg 200 mL/hr over 60 Minutes Intravenous Every 24 hours 07/07/14 0937     07/06/14 2000  fluconazole (DIFLUCAN) IVPB 400 mg     400 mg 100 mL/hr over 120 Minutes Intravenous Every 24 hours 07/06/14 1507     07/06/14 1800  piperacillin-tazobactam (ZOSYN) IVPB 2.25 g     2.25 g 100 mL/hr over 30 Minutes Intravenous 4 times per day 07/06/14 1505     07/05/14 2000  fluconazole (DIFLUCAN) IVPB 200 mg  Status:  Discontinued     200 mg 100 mL/hr over 60 Minutes Intravenous Every 24 hours 07/05/14 0757 07/06/14 1507   07/05/14 1400  piperacillin-tazobactam (ZOSYN) IVPB 2.25 g  Status:  Discontinued     2.25 g 100 mL/hr over 30 Minutes Intravenous 3 times per day 07/05/14 0749 07/06/14 1505   07/02/14 1800  vancomycin (VANCOCIN) IVPB 1000 mg/200 mL premix  Status:  Discontinued     1,000 mg 200 mL/hr over 60 Minutes Intravenous Every 24 hours 07/01/14 1858 07/05/14 0801   07/01/14 2200  piperacillin-tazobactam (ZOSYN) IVPB 3.375 g  Status:  Discontinued     3.375 g 100 mL/hr over 30 Minutes  Intravenous 4 times per day 07/01/14 1858 07/05/14 0749   07/01/14 2000  fluconazole (DIFLUCAN) IVPB 400 mg  Status:  Discontinued     400 mg 100 mL/hr over 120 Minutes Intravenous Every 24 hours 07/01/14 1858 07/05/14 0757   07/01/14 1400  fluconazole (DIFLUCAN) IVPB 200 mg  Status:  Discontinued     200 mg 100 mL/hr over 60 Minutes Intravenous Every 24 hours 07/01/14 1330 07/01/14 1853   07/01/14 1000  piperacillin-tazobactam (ZOSYN) IVPB 2.25 g  Status:  Discontinued     2.25 g 100 mL/hr over 30 Minutes Intravenous Every 8 hours 07/01/14 0952 07/01/14 1853   07/01/14 1000  vancomycin (VANCOCIN) 500 mg in sodium chloride 0.9 % 100 mL IVPB     500 mg 100 mL/hr over 60 Minutes Intravenous  Once 07/01/14 0952 07/01/14 1126   06/30/14 1200  vancomycin (VANCOCIN) IVPB 1000 mg/200 mL premix     1,000 mg 200 mL/hr over 60 Minutes Intravenous  Once 06/30/14 0944 06/30/14 1403   06/30/14 1200  ceFEPIme (MAXIPIME) 2 g in dextrose 5 % 50 mL IVPB     2 g 100 mL/hr over 30 Minutes Intravenous  Once 06/30/14 0944 06/30/14 1425   06/30/14 0100  vancomycin (VANCOCIN) 2,000 mg in sodium chloride 0.9 % 500 mL IVPB     2,000 mg 250 mL/hr over 120 Minutes Intravenous  Once 06/30/14 0048 06/30/14 0525   06/30/14 0100  ceFEPIme (MAXIPIME) 2 g in dextrose 5 % 50 mL IVPB     2 g 100 mL/hr over 30 Minutes Intravenous  Once 06/30/14 0048 06/30/14 0322   06/27/14 0600  cefUROXime (ZINACEF) 1.5 g in dextrose 5 % 50 mL IVPB     1.5 g 100 mL/hr over 30 Minutes Intravenous On call to O.R. 06/26/14 1019 06/27/14 0630   06/19/14 1800  oseltamivir (TAMIFLU) capsule 30 mg     30 mg Oral Every M-W-F (1800) 06/19/14 1003 06/22/14 1841   06/18/14 1600  oseltamivir (TAMIFLU) capsule 30 mg  Status:  Discontinued     30 mg Oral Daily 06/18/14 1538 06/19/14 1003       Assessment/Plan   1.POD 7, EXPLORATORY LAPAROTOMY WITH CLOSURE OF PERFORATED PYLORIC ULCER, SMALL BOWEL RESECTION - 07/01/2014 -  Derrell Lolling Diflucan/Zosyn Ileus- Has NGT - will need to continue this for now as she has no bowel function 1B. Open wound - clean  2. Leukocytosis WBC - 20K - 07/08/2014 CT 07/06/2014 - no leak or obvious abscess, but she does have a 7+cm fluid collection in the pelvis.this appears to be simple fluid.  No evidence of abscess.  WBC decreasing.  No indications for aspiration or drainage at this time.  3. Anemia - 7.5 - 07/08/2014 4. ESRD On CVVH 5. Nutrition - on TPN 6. Stroke - 06/19/2014 Left hemiplegia - PT ordered 7. 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  Ovidio Kin, MD, [facility] Surgery Pager: [phone] Office phone:  [phone]

## 2014-07-09 ENCOUNTER — Inpatient Hospital Stay (HOSPITAL_COMMUNITY): Payer: Medicaid Other

## 2014-07-09 LAB — CBC WITH DIFFERENTIAL/PLATELET
BASOS ABS: 0 10*3/uL (ref 0.0–0.1)
BASOS PCT: 0 % (ref 0–1)
Band Neutrophils: 0 % (ref 0–10)
Blasts: 0 %
Eosinophils Absolute: 0 10*3/uL (ref 0.0–0.7)
Eosinophils Relative: 0 % (ref 0–5)
HCT: 21.2 % — ABNORMAL LOW (ref 36.0–46.0)
HEMOGLOBIN: 6.7 g/dL — AB (ref 12.0–15.0)
LYMPHS ABS: 0.8 10*3/uL (ref 0.7–4.0)
Lymphocytes Relative: 3 % — ABNORMAL LOW (ref 12–46)
MCH: 25.7 pg — ABNORMAL LOW (ref 26.0–34.0)
MCHC: 31.6 g/dL (ref 30.0–36.0)
MCV: 81.2 fL (ref 78.0–100.0)
Metamyelocytes Relative: 0 %
Monocytes Absolute: 1.5 10*3/uL — ABNORMAL HIGH (ref 0.1–1.0)
Monocytes Relative: 6 % (ref 3–12)
Myelocytes: 0 %
NEUTROS ABS: 23.3 10*3/uL — AB (ref 1.7–7.7)
NEUTROS PCT: 91 % — AB (ref 43–77)
PROMYELOCYTES ABS: 0 %
Platelets: 31 10*3/uL — ABNORMAL LOW (ref 150–400)
RBC: 2.61 MIL/uL — AB (ref 3.87–5.11)
RDW: 21.1 % — ABNORMAL HIGH (ref 11.5–15.5)
WBC: 25.6 10*3/uL — ABNORMAL HIGH (ref 4.0–10.5)
nRBC: 0 /100 WBC

## 2014-07-09 LAB — RENAL FUNCTION PANEL
ALBUMIN: 1.2 g/dL — AB (ref 3.5–5.2)
ANION GAP: 9 (ref 5–15)
ANION GAP: 10 (ref 5–15)
Albumin: 1.3 g/dL — ABNORMAL LOW (ref 3.5–5.2)
BUN: 38 mg/dL — ABNORMAL HIGH (ref 6–23)
BUN: 34 mg/dL — ABNORMAL HIGH (ref 6–23)
CHLORIDE: 97 mmol/L (ref 96–112)
CO2: 21 mmol/L (ref 19–32)
CO2: 21 mmol/L (ref 19–32)
Calcium: 6.7 mg/dL — ABNORMAL LOW (ref 8.4–10.5)
Calcium: 7.3 mg/dL — ABNORMAL LOW (ref 8.4–10.5)
Chloride: 98 mmol/L (ref 96–112)
Creatinine, Ser: 2.08 mg/dL — ABNORMAL HIGH (ref 0.50–1.10)
Creatinine, Ser: 1.93 mg/dL — ABNORMAL HIGH (ref 0.50–1.10)
GFR calc non Af Amer: 25 mL/min — ABNORMAL LOW (ref >90)
GFR, EST AFRICAN AMERICAN: 29 mL/min — AB (ref >90)
GFR, EST AFRICAN AMERICAN: 32 mL/min — AB (ref >90)
GFR, EST NON AFRICAN AMERICAN: 27 mL/min — AB (ref >90)
GLUCOSE: 198 mg/dL — AB (ref 70–99)
Glucose, Bld: 188 mg/dL — ABNORMAL HIGH (ref 70–99)
POTASSIUM: 3.9 mmol/L (ref 3.5–5.1)
Phosphorus: 1.5 mg/dL — ABNORMAL LOW (ref 2.3–4.6)
Phosphorus: 2.2 mg/dL — ABNORMAL LOW (ref 2.3–4.6)
Potassium: 3.9 mmol/L (ref 3.5–5.1)
SODIUM: 128 mmol/L — AB (ref 135–145)
Sodium: 128 mmol/L — ABNORMAL LOW (ref 135–145)

## 2014-07-09 LAB — CBC
HEMATOCRIT: 21.7 % — AB (ref 36.0–46.0)
Hemoglobin: 7 g/dL — ABNORMAL LOW (ref 12.0–15.0)
MCH: 25.9 pg — AB (ref 26.0–34.0)
MCHC: 32.3 g/dL (ref 30.0–36.0)
MCV: 80.4 fL (ref 78.0–100.0)
PLATELETS: 35 10*3/uL — AB (ref 150–400)
RBC: 2.7 MIL/uL — AB (ref 3.87–5.11)
RDW: 20.9 % — ABNORMAL HIGH (ref 11.5–15.5)
WBC: 23.3 10*3/uL — AB (ref 4.0–10.5)

## 2014-07-09 LAB — MAGNESIUM: Magnesium: 2 mg/dL (ref 1.5–2.5)

## 2014-07-09 LAB — COMPREHENSIVE METABOLIC PANEL
ALT: 29 U/L (ref 0–35)
ANION GAP: 10 (ref 5–15)
AST: 17 U/L (ref 0–37)
Albumin: 1.2 g/dL — ABNORMAL LOW (ref 3.5–5.2)
Alkaline Phosphatase: 111 U/L (ref 39–117)
BILIRUBIN TOTAL: 2 mg/dL — AB (ref 0.3–1.2)
BUN: 37 mg/dL — AB (ref 6–23)
CALCIUM: 7.1 mg/dL — AB (ref 8.4–10.5)
CHLORIDE: 100 mmol/L (ref 96–112)
CO2: 20 mmol/L (ref 19–32)
Creatinine, Ser: 2.11 mg/dL — ABNORMAL HIGH (ref 0.50–1.10)
GFR calc non Af Amer: 25 mL/min — ABNORMAL LOW (ref >90)
GFR, EST AFRICAN AMERICAN: 29 mL/min — AB (ref >90)
Glucose, Bld: 201 mg/dL — ABNORMAL HIGH (ref 70–99)
Potassium: 3.9 mmol/L (ref 3.5–5.1)
Sodium: 130 mmol/L — ABNORMAL LOW (ref 135–145)
Total Protein: 4.9 g/dL — ABNORMAL LOW (ref 6.0–8.3)

## 2014-07-09 LAB — GLUCOSE, CAPILLARY
GLUCOSE-CAPILLARY: 140 mg/dL — AB (ref 70–99)
GLUCOSE-CAPILLARY: 175 mg/dL — AB (ref 70–99)
GLUCOSE-CAPILLARY: 185 mg/dL — AB (ref 70–99)
Glucose-Capillary: 181 mg/dL — ABNORMAL HIGH (ref 70–99)
Glucose-Capillary: 120 mg/dL — ABNORMAL HIGH (ref 70–99)
Glucose-Capillary: 132 mg/dL — ABNORMAL HIGH (ref 70–99)

## 2014-07-09 LAB — PREPARE RBC (CROSSMATCH)

## 2014-07-09 LAB — PHOSPHORUS: PHOSPHORUS: 1.5 mg/dL — AB (ref 2.3–4.6)

## 2014-07-09 LAB — PREALBUMIN: Prealbumin: 10 mg/dL — ABNORMAL LOW (ref 18.0–45.0)

## 2014-07-09 MED ORDER — SODIUM CHLORIDE 0.9 % IV SOLN: Freq: Once | INTRAVENOUS | Status: DC

## 2014-07-09 MED ORDER — HYDROCORTISONE NA SUCCINATE PF 100 MG IJ SOLR
50.0000 mg | Freq: Four times a day (QID) | INTRAMUSCULAR | Status: DC
  Administered 2014-07-09 – 2014-07-12 (×14): 50 mg via INTRAVENOUS
  Filled 2014-07-09 (×18): qty 1

## 2014-07-09 MED ORDER — SODIUM PHOSPHATE 3 MMOLE/ML IV SOLN
20.0000 mmol | Freq: Once | INTRAVENOUS | Status: AC
  Administered 2014-07-09: 20 mmol via INTRAVENOUS
  Filled 2014-07-09: qty 6.67

## 2014-07-09 MED ORDER — TRACE MINERALS CR-CU-F-FE-I-MN-MO-SE-ZN IV SOLN
INTRAVENOUS | Status: AC
  Administered 2014-07-09: 17:00:00 via INTRAVENOUS
  Filled 2014-07-09: qty 2640

## 2014-07-09 MED ORDER — HEPARIN SODIUM (PORCINE) 1000 UNIT/ML IJ SOLN
1000.0000 [IU] | Freq: Once | INTRAMUSCULAR | Status: AC
  Administered 2014-07-09: 1000 [IU] via INTRAVENOUS
  Filled 2014-07-09: qty 1

## 2014-07-09 MED ORDER — INSULIN ASPART 100 UNIT/ML ~~LOC~~ SOLN
0.0000 [IU] | SUBCUTANEOUS | Status: DC
  Administered 2014-07-09: 3 [IU] via SUBCUTANEOUS
  Administered 2014-07-09: 1 [IU] via SUBCUTANEOUS
  Administered 2014-07-10: 3 [IU] via SUBCUTANEOUS
  Administered 2014-07-10: 2 [IU] via SUBCUTANEOUS
  Administered 2014-07-10: 1 [IU] via SUBCUTANEOUS
  Administered 2014-07-10 (×2): 3 [IU] via SUBCUTANEOUS
  Administered 2014-07-10: 5 [IU] via SUBCUTANEOUS
  Administered 2014-07-11: 2 [IU] via SUBCUTANEOUS
  Administered 2014-07-11: 8 [IU] via SUBCUTANEOUS
  Administered 2014-07-11: 5 [IU] via SUBCUTANEOUS
  Administered 2014-07-11: 3 [IU] via SUBCUTANEOUS
  Administered 2014-07-12 (×2): 2 [IU] via SUBCUTANEOUS
  Administered 2014-07-12 – 2014-07-13 (×3): 3 [IU] via SUBCUTANEOUS
  Administered 2014-07-13 (×3): 2 [IU] via SUBCUTANEOUS
  Administered 2014-07-13: 3 [IU] via SUBCUTANEOUS
  Administered 2014-07-14: 2 [IU] via SUBCUTANEOUS
  Administered 2014-07-14: 3 [IU] via SUBCUTANEOUS
  Administered 2014-07-14: 2 [IU] via SUBCUTANEOUS
  Administered 2014-07-15: 3 [IU] via SUBCUTANEOUS
  Administered 2014-07-15: 2 [IU] via SUBCUTANEOUS

## 2014-07-09 MED ORDER — FAT EMULSION 20 % IV EMUL
240.0000 mL | INTRAVENOUS | Status: AC
  Administered 2014-07-09: 240 mL via INTRAVENOUS
  Filled 2014-07-09: qty 250

## 2014-07-09 MED ORDER — DEXTROSE 5 % IV SOLN
30.0000 ug/min | INTRAVENOUS | Status: DC
  Administered 2014-07-09: 100 ug/min via INTRAVENOUS
  Administered 2014-07-09: 150 ug/min via INTRAVENOUS
  Administered 2014-07-09: 200 ug/min via INTRAVENOUS
  Administered 2014-07-10: 45 ug/min via INTRAVENOUS
  Administered 2014-07-11: 34 ug/min via INTRAVENOUS
  Filled 2014-07-09 (×8): qty 4

## 2014-07-09 MED ORDER — IOHEXOL 300 MG/ML  SOLN
80.0000 mL | Freq: Once | INTRAMUSCULAR | Status: AC | PRN
  Administered 2014-07-09: 80 mL via INTRAVENOUS

## 2014-07-09 NOTE — Progress Notes (Signed)
Crrt stopped to take patient to CT scan. Pt uncomfortable, pt received Fentanyl 50mcg and is resting comfortably. Dr Marchelle Gearingamaswamy called to verify HD cath can be hep locked for transport. Dr Marchelle Gearingamaswamy wrote care order to allow for heparin lock. HD catheter locked, capped, and sticker placed for Heparin. NG clamped, mouth moisturizer applied and pt awaiting CT transport.

## 2014-07-09 NOTE — Progress Notes (Signed)
Name: [name] MRN: [id number] DOB: [birth year]    ADMISSION DATE:  06/17/2014 CONSULTATION DATE:  3/30  REFERRING MD :  Mahala MenghiniSamtani   CHIEF COMPLAINT:  Hypoxia   BRIEF PATIENT DESCRIPTION: 59 yo female smoker with hx HTN, ESRD on HD (just began this admit), COPD, failed L AV fistula, initially admitted 3/16 with flu, acute hypoxic resp failure.  [facility] for worsening metabolic parameters and need for HD.  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  Has made little improvement and on 3/30 has worsening hypoxia, generalized pain, hyperkalemia, worsening leukocytosis and PCCM consulted.     SIGNIFICANT EVENTS  3/16 influenza A+ 3/18 R ACA stroke 3/31 to or ex lap for per pyloric ulcer and sm bowel resection 4/1- septic shock on pressors 4/3-pressors weaned off  4/4- restart pressors, CRRT 4/5- run of SVTs 4/6- off levo switched to neo  STUDIES:  3/19 MRI: multifocal acute infarction of both hemispheres, large right distal ACA 3/22 TEE: severe RAE, severely reduced RV function, severe TR, no LAA thrombus  3/30 CT chest/abd: perf bowel 2/2 presumed embolism in setting of ACA, ?rectrovaginal abscess development, LLL collapse 4/4 CT abd/pelvis: small pleural effusion left, no leaking contrast or SBO findings   SUBJECTIVE:  Pt had some more runs of SVT overnight has hypoxia during sleep. Not feeling well this AM.  VITAL SIGNS: Temp:  [97.6 F (36.4 C)-98 F (36.7 C)] 98 F (36.7 C) (04/07 0400) Resp:  [19-29] 25 (04/07 0600) BP: (58-122)/(33-71) 122/56 mmHg (04/07 0600) SpO2:  [90 %-100 %] 93 % (04/07 0600) Weight:  [212 lb 4.9 oz (96.3 kg)] 212 lb 4.9 oz (96.3 kg) (04/07 0452)  PHYSICAL EXAMINATION: General:  Chronically ill appearing female, awake and interactive Neuro: follows commands, left arm and leg paralysis HEENT:  MMM, no scleral icterus or conjunctival injection Cardiovascular: RRR, no rubs murmurs or gallops Lungs:  Slight  expiratory wheezes throughout, decreased BS on right side compared to left, coarse BS in left lung base Abdomen:  Round, mildly distended, tender, hypoactive bs , dressing intact c/d/i  Musculoskeletal:  Warm and dry, 1+ pitting edema  CBC Recent Labs     07/08/14  0434  07/08/14  2238  07/09/14  0430  WBC  20.5*  23.3*  25.6*  HGB  7.5*  7.0*  6.7*  HCT  22.9*  21.7*  21.2*  PLT  42*  35*  31*    Coag's Recent Labs     07/07/14  0400  APTT  41*    BMET Recent Labs     07/08/14  0447  07/08/14  1620  07/09/14  0430  NA  130*  130*  130*  K  3.5  4.0  3.9  CL  97  99  100  CO2  25  25  20   BUN  44*  41*  37*  CREATININE  2.44*  2.21*  2.11*  GLUCOSE  173*  206*  201*    Electrolytes Recent Labs     07/07/14  0400   07/08/14  0434  07/08/14  0447  07/08/14  1620  07/09/14  0430  CALCIUM  7.3*   < >   --   7.5*  7.3*  7.1*  MG  1.9   --   2.0   --    --   2.0  PHOS  3.8   < >   --  2.3  1.7*  1.5*   < > = values in this interval not displayed.    Liver Enzymes Recent Labs     07/08/14  0447  07/08/14  1620  07/09/14  0430  AST   --    --   17  ALT   --    --   29  ALKPHOS   --    --   111  BILITOT   --    --   2.0*  ALBUMIN  1.3*  1.3*  1.2*   Glucose Recent Labs     07/08/14  0709  07/08/14  1117  07/08/14  1612  07/08/14  1921  07/09/14  0008  07/09/14  0428  GLUCAP  154*  151*  146*  131*  140*  181*    Imaging Dg Chest Port 1 View  07/09/2014   CLINICAL DATA:  End-stage renal disease, pulmonary edema, acute respiratory failure.  EXAM: PORTABLE CHEST - 1 VIEW  COMPARISON:  Portable chest x-ray of July 07, 2014  FINDINGS: The patient is somewhat rotated toward the left. The right lung is well-expanded. There is persistent increased density in the left lower lobe with better visualization of the hemidiaphragm however. The cardiac silhouette remains enlarged. The central pulmonary vascularity is engorged. A small left pleural effusion is  suspected.  The endotracheal tube tip lies approximately 4.9 cm above the carina. The dialysis type catheter tip projects over the midportion of the SVC. The left internal jugular venous catheter tip projects at the junction of the left internal jugular vein with the left subclavian vein. The esophagogastric tube tip projects below the inferior margin of the image.  IMPRESSION: There has been slight interval improvement in the appearance of the left lower lobe. There is persistent atelectasis or pneumonia with small left pleural effusion. There is also mild overall improvement in the pulmonary interstitial edema.   Electronically Signed   By: David  [place]   On: 07/09/2014 07:23   Dg Chest Port 1 View  07/07/2014   CLINICAL DATA:  Hypoxia.  EXAM: PORTABLE CHEST - 1 VIEW  COMPARISON:  07/06/2014 and 07/04/2014.  FINDINGS: 0852 hours. The nasogastric tube, right IJ dialysis catheter and left IJ central venous catheter appear unchanged. Stable left hemidiaphragm elevation and left basilar airspace disease. Vascular congestion has increased and there may be mild edema. No pneumothorax or significant pleural effusion identified. The heart size and mediastinal contours are stable.  IMPRESSION: Increased vascular congestion with possible mild pulmonary edema. Otherwise stable examination.   Electronically Signed   By: Carey Bullocks M.D.   On: 07/07/2014 09:06     ASSESSMENT / PLAN: ETT 3/31 >> 4/02  PULMONARY A: Acute hypoxic respiratory failure 2nd to H1N1, hypervolemia, HCAP. Hx of COPD. Atelectasis. Night time hypoxia concern for OHS or OSA will add prn CPAP qhs P: Oxygen to keep SpO2 > 92% Bronchial hygiene Negative fluid balance as tolerated Scheduled BD's CPAP qhs   CARDIAC A: Chronic dCHF - EF 60-65%, grade 1 diastolic dysfunction. SVT runs 4/04>>transitioned to neo (pt good EF 55-60%) Hypotensive. P: Pt transitioned to neo since had continued SVTs to keep MAP > 65 Pt spontaneously  converting back to NSR will continue to monitor for now   GASTROENTEROLOGY A: Pyloric ulcer perforation s/p laparotomy P: Post op care per [facility]CS TPN per [facility] Continue protonix Prolonged ileus with no flatus or BM, Spoke with surgery in terms of pursuing IR aspiration/drainage of pelvic fluid  RENAL: A: ESRD P: CVVH beginning to pull fluid  HEMATOLOGY A: Anemia of critical illness and chronic disease. Thrombocytopenia. Pltlt 42. HIT only slightly + but serotonin assay negative, therefore will hold off argatroban at this time could be component of Abx toxicity or ongoing sepsis  P: F/u CBC SCD's  INFECTION A: Peritonitis 2nd to perforated pyloric ulcer >> f/u CT 4/04 unremarkable for abscess. HCAP with CXR improving Consult IR for possible aspiration/drain of pelvic fluid collection  P: Day 9 of Abx, currently on zosyn, vancomycin, diflucan  3/30 anaerobic>>rare lactobacillus   ENDOCRINE A: DM type II. P: SSI  NEUROLOGY A: ACA CVA with Lt sided weakness. Deconditioning. P: Off ASA due to thrombocytopenia PT/OT when able   Summary: Continues to need pressors, pt on CRRT, continue to monitor platelets concern for ongoing sepsis there is pelvic 7cm fluid collection persistent on pt CT ?abscess as possible etiology for ongoing leukocytosis will have IR pursue aspiration/drainage.   Christen Bame, MD IM PGY-3 Pgr: [phone]  Reviewed above, examined.  She remains tenuous.  Remains on pressors.  Intermittent SVT.    She feels weak.  Still has abdominal discomfort.  Heart rate regular, breath sounds clearer, still has abd soreness but wound dressing clean.  Continue CRRT per renal.  Continue Abx and diflucan.  Wean pressors as tolerated.  Monitor heart rhythm.  Will ask IR to assess pelvic fluid collection seen on CT 4/04 and determine if drainage can be done.  CC time by me independent of resident time is 35 minutes.  Coralyn Helling, MD [facility]/Critical  [facility] 07/09/2014, 10:13 AM Pager:  [phone] After 3pm call: [phone]

## 2014-07-09 NOTE — Progress Notes (Signed)
General Surgery Note  LOS: 22 days  POD -  8 Days Post-Op  Assessment/Plan: 1.  EXPLORATORY LAPAROTOMY WITH CLOSURE OF PERFORATED PYLORIC ULCER, SMALL BOWEL RESECTION - 07/01/2014 - Derrell Lolling  Diflucan/Zosyn/Vanc  Has NGT - no BM  With persistent ileus and persistent elevated WBC (with no obvious reason) - would ge IR to perc drain pelvic fluid collection seen on CT scan on 07/06/2014. 1B.  Open wound - clean - not source of leukocytosis  2.  Leukocytosis  WBC - 25,600 - 07/09/2014  3.  Anemia - 6.7 - 07/09/2014 4.  ESRD - Creat - 2.1 - 07/09/2014  On CVVH 5.  Nutrition - on TPN 6.  Stroke - 06/19/2014  Left hemiplegia - this will probably limit her mobility and return to normal activities 7.  Activity - PT ordered 8.  On Solucortef    Principal Problem:   Acute respiratory failure with hypoxia Active Problems:   Cerebral thrombosis with cerebral infarction   Hypertension   ESRD needing dialysis   Obesity (BMI 30-39.9)   Depression   Left renal mass   Chronic diastolic heart failure   H1N1 influenza   Tobacco use disorder   Renal failure (ARF), acute on chronic   Hyperlipidemia   PSVT (paroxysmal supraventricular tachycardia)   Hypoxia   SOB (shortness of breath)   Perforated gastric ulcer  Subjective:  Doing okay.  She says that she feels okay, but I'm not sure.   Objective:   Filed Vitals:   07/09/14 0900  BP: 121/73  Pulse:   Temp:   Resp: 19     Intake/Output from previous day:  04/06 0701 - 04/07 0700 In: 4715.2 [I.V.:1042.9; IV Piggyback:900; TPN:2772.3] Out: 4457 [Emesis/NG output:375]  Intake/Output this shift:  Total I/O In: 390 [I.V.:150; TPN:240] Out: 414 [Emesis/NG output:50; Other:364]   Physical Exam:   General: [name] who is alert and oriented.    HEENT: Normal. Pupils equal. .   Lungs: Wheezes.  Has right subclavian line.   Abdomen: Soft.  Rare BS.  Drain in RLQ - no recorded output.   Wound: Clean.   Lab Results:     Recent Labs  07/08/14 2238 07/09/14 0430  WBC 23.3* 25.6*  HGB 7.0* 6.7*  HCT 21.7* 21.2*  PLT 35* 31*    BMET    Recent Labs  07/08/14 1620 07/09/14 0430  NA 130* 130*  K 4.0 3.9  CL 99 100  CO2 25 20  GLUCOSE 206* 201*  BUN 41* 37*  CREATININE 2.21* 2.11*  CALCIUM 7.3* 7.1*    PT/INR  No results for input(s): LABPROT, INR in the last 72 hours.  ABG  No results for input(s): PHART, HCO3 in the last 72 hours.  Invalid input(s): PCO2, PO2   Studies/Results:  Dg Chest Port 1 View  07/09/2014   CLINICAL DATA:  End-stage renal disease, pulmonary edema, acute respiratory failure.  EXAM: PORTABLE CHEST - 1 VIEW  COMPARISON:  Portable chest x-ray of July 07, 2014  FINDINGS: The patient is somewhat rotated toward the left. The right lung is well-expanded. There is persistent increased density in the left lower lobe with better visualization of the hemidiaphragm however. The cardiac silhouette remains enlarged. The central pulmonary vascularity is engorged. A small left pleural effusion is suspected.  The endotracheal tube tip lies approximately 4.9 cm above the carina. The dialysis type catheter tip projects over the midportion of the SVC. The left internal jugular venous catheter tip projects at  the junction of the left internal jugular vein with the left subclavian vein. The esophagogastric tube tip projects below the inferior margin of the image.  IMPRESSION: There has been slight interval improvement in the appearance of the left lower lobe. There is persistent atelectasis or pneumonia with small left pleural effusion. There is also mild overall improvement in the pulmonary interstitial edema.   Electronically Signed   By: Nea Gittens  [place]   On: 07/09/2014 07:23     Anti-infectives:   Anti-infectives    Start     Dose/Rate Route Frequency Ordered Stop   07/07/14 1200  vancomycin (VANCOCIN) IVPB 1000 mg/200 mL premix     1,000 mg 200 mL/hr over 60 Minutes Intravenous Every 24 hours 07/07/14 0937      07/06/14 2000  fluconazole (DIFLUCAN) IVPB 400 mg     400 mg 100 mL/hr over 120 Minutes Intravenous Every 24 hours 07/06/14 1507     07/06/14 1800  piperacillin-tazobactam (ZOSYN) IVPB 2.25 g     2.25 g 100 mL/hr over 30 Minutes Intravenous 4 times per day 07/06/14 1505     07/05/14 2000  fluconazole (DIFLUCAN) IVPB 200 mg  Status:  Discontinued     200 mg 100 mL/hr over 60 Minutes Intravenous Every 24 hours 07/05/14 0757 07/06/14 1507   07/05/14 1400  piperacillin-tazobactam (ZOSYN) IVPB 2.25 g  Status:  Discontinued     2.25 g 100 mL/hr over 30 Minutes Intravenous 3 times per day 07/05/14 0749 07/06/14 1505   07/02/14 1800  vancomycin (VANCOCIN) IVPB 1000 mg/200 mL premix  Status:  Discontinued     1,000 mg 200 mL/hr over 60 Minutes Intravenous Every 24 hours 07/01/14 1858 07/05/14 0801   07/01/14 2200  piperacillin-tazobactam (ZOSYN) IVPB 3.375 g  Status:  Discontinued     3.375 g 100 mL/hr over 30 Minutes Intravenous 4 times per day 07/01/14 1858 07/05/14 0749   07/01/14 2000  fluconazole (DIFLUCAN) IVPB 400 mg  Status:  Discontinued     400 mg 100 mL/hr over 120 Minutes Intravenous Every 24 hours 07/01/14 1858 07/05/14 0757   07/01/14 1400  fluconazole (DIFLUCAN) IVPB 200 mg  Status:  Discontinued     200 mg 100 mL/hr over 60 Minutes Intravenous Every 24 hours 07/01/14 1330 07/01/14 1853   07/01/14 1000  piperacillin-tazobactam (ZOSYN) IVPB 2.25 g  Status:  Discontinued     2.25 g 100 mL/hr over 30 Minutes Intravenous Every 8 hours 07/01/14 0952 07/01/14 1853   07/01/14 1000  vancomycin (VANCOCIN) 500 mg in sodium chloride 0.9 % 100 mL IVPB     500 mg 100 mL/hr over 60 Minutes Intravenous  Once 07/01/14 0952 07/01/14 1126   06/30/14 1200  vancomycin (VANCOCIN) IVPB 1000 mg/200 mL premix     1,000 mg 200 mL/hr over 60 Minutes Intravenous  Once 06/30/14 0944 06/30/14 1403   06/30/14 1200  ceFEPIme (MAXIPIME) 2 g in dextrose 5 % 50 mL IVPB     2 g 100 mL/hr over 30 Minutes  Intravenous  Once 06/30/14 0944 06/30/14 1425   06/30/14 0100  vancomycin (VANCOCIN) 2,000 mg in sodium chloride 0.9 % 500 mL IVPB     2,000 mg 250 mL/hr over 120 Minutes Intravenous  Once 06/30/14 0048 06/30/14 0525   06/30/14 0100  ceFEPIme (MAXIPIME) 2 g in dextrose 5 % 50 mL IVPB     2 g 100 mL/hr over 30 Minutes Intravenous  Once 06/30/14 0048 06/30/14 0322   06/27/14 0600  cefUROXime (  ZINACEF) 1.5 g in dextrose 5 % 50 mL IVPB     1.5 g 100 mL/hr over 30 Minutes Intravenous On call to O.R. 06/26/14 1019 06/27/14 0630   06/19/14 1800  oseltamivir (TAMIFLU) capsule 30 mg     30 mg Oral Every M-W-F (1800) 06/19/14 1003 06/22/14 1841   06/18/14 1600  oseltamivir (TAMIFLU) capsule 30 mg  Status:  Discontinued     30 mg Oral Daily 06/18/14 1538 06/19/14 1003      Ovidio Kinavid Laniece Hornbaker, MD, FACS Pager: [phone] [facility] [place] [facility] Office: [phone] 07/09/2014

## 2014-07-09 NOTE — Progress Notes (Signed)
eLink Physician-Brief Progress Note Patient Name: [name] DOB: [birth year] MRN: [id number]   Date of Service  07/09/2014  HPI/Events of Note  Anemia Hgb 6.6  eICU Interventions  Transfuse 1 U PRBC     Intervention Category Intermediate Interventions: Diagnostic test evaluation  MCQUAID, DOUGLAS 07/09/2014, 5:30 AM

## 2014-07-09 NOTE — Progress Notes (Signed)
[facility] Progress Note   Subjective: SVT issues overnight. WBC/ fever climbing, surg rec's drain pelvic fluid collection  Filed Vitals:   07/09/14 0815 07/09/14 0900 07/09/14 1000 07/09/14 1100  BP: 118/64 121/73 126/71 101/51  Pulse:      Temp:      TempSrc:      Resp: Height:      Weight:      SpO2: 98% 100% 99% 96%   Exam: Lethargic, arouses easily, no distress, NG in place No jvd Chest clear bilat RRR no MRG Abd distended, large dressing in place, drain R abd, dec'd BS 2+ pitting dependent edema LE's/hips bilat No UE edema  HD: new start Max inpt weight 106kg, minimum 94.1kg        Assessment: 1. Gastric ulcer w perforation , s/p repair 3/30 - on vanc/ zosyn, pressors 2. Pulm - LLL density on CXR, edema resolved 3. S/P acute ACA stroke / left hemiparesis 4. ESRD new start to HD; cont CRRT 5. 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  No binders d/t low phos 7. Vol excess- d/t TNA. Wt down, keep even for now w CRRT 8. COPD/ tobacco 9. S/P influenza H1N1 10. 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 HD access - we are using Permcath 3/26 d/t AVF problems; when stable VVS wants to considering revision of AVF  Plan - cont CRRT, keep even    [name] MD  pager [phone]    cell [phone]  07/09/2014, 11:04 AM     Recent Labs Lab 07/08/14 1620 07/09/14 0430 07/09/14 0500  NA 130* 130* 128*  K 4.0 3.9 3.9  CL 99 100 98  CO2 GLUCOSE 206* 201* 198*  BUN 41* 37* 38*  CREATININE 2.21* 2.11* 2.08*  CALCIUM 7.3* 7.1* 6.7*  PHOS 1.7* 1.5* 1.5*    Recent Labs Lab 07/03/14 0445  07/06/14 0552  07/08/14 1620 07/09/14 0430 07/09/14 0500  AST 231*  --  25  --   --  17  --   ALT 302*  --  69*  --   --  29  --   ALKPHOS 78  --  75  --   --  111  --   BILITOT 1.8*  --  1.9*  --   --  2.0*  --   PROT 5.3*  --  4.5*  --   --  4.9*  --   ALBUMIN 2.0*  < > 1.4*  < > 1.3* 1.2* 1.2*  < > = values in this interval  not displayed.  Recent Labs Lab 07/06/14 0553  07/08/14 0434 07/08/14 2238 07/09/14 0430  WBC 24.0*  < > 20.5* 23.3* 25.6*  NEUTROABS 21.1*  --  18.6*  --  23.3*  HGB 8.8*  < > 7.5* 7.0* 6.7*  HCT 27.7*  < > 22.9* 21.7* 21.2*  MCV 82.4  < > 80.4 80.4 81.2  PLT 45*  < > 42* 35* 31*  < > = values in this interval not displayed. . sodium chloride   Intravenous Once  . chlorhexidine  15 mL Mouth Rinse BID  . [START ON 07/11/2014] darbepoetin (ARANESP) injection - DIALYSIS  150 mcg Intravenous Q Sat-HD  . ferric gluconate (FERRLECIT/NULECIT) IV  125 mg Intravenous Q M,W,F-HD  . fluconazole (DIFLUCAN) IV  400 mg Intravenous Q24H  . hydrocortisone sodium succinate  50 mg Intravenous Q6H  . insulin aspart  0-15 Units Subcutaneous 6 times per day  . ipratropium  0.5 mg Nebulization Q6H  . levalbuterol  0.63 mg Nebulization Q6H  . pantoprazole (PROTONIX) IV  40 mg Intravenous Q12H  . piperacillin-tazobactam (ZOSYN)  IV  2.25 g Intravenous 4 times per day  . sodium phosphate  Dextrose 5% IVPB  20 mmol Intravenous Once  . vancomycin  1,000 mg Intravenous Q24H   . sodium chloride 10 mL/hr at 07/09/14 0700  . TPN (CLINIMIX) Adult without lytes 110 mL/hr at 07/08/14 2000   And  . fat emulsion 240 mL (07/08/14 2000)  . TPN (CLINIMIX) Adult without lytes     And  . fat emulsion    . norepinephrine (LEVOPHED) Adult infusion Stopped (07/08/14 2250)  . phenylephrine (NEO-SYNEPHRINE) Adult infusion 150 mcg/min (07/09/14 1100)  . dialysis replacement fluid (prismasate) 400 mL/hr at 07/08/14 2222  . dialysis replacement fluid (prismasate) 200 mL/hr at 07/08/14 2300  . dialysate (PRISMASATE) 1,500 mL/hr at 07/09/14 0656   albuterol, anticoagulant sodium citrate, fentaNYL, metoprolol, morphine injection, ondansetron (ZOFRAN) IV, sodium chloride

## 2014-07-09 NOTE — Progress Notes (Signed)
Pt with ongoing hypotension, max dose neo gtt. Levo currently off due to runs of SVT.  No longer pulling fluid on CRRT.  No signs of acute bleeding, CVP remains 10.  Dr Kendrick FriesMcQuaid notified, orders received.  Will continue to not pull fluid while pt is hypotensive and will monitor closley

## 2014-07-09 NOTE — Progress Notes (Addendum)
PT Cancellation Note/Discharge note  Patient Details Name: [name] MRN: [id number] DOB: March 11, 1956   Cancelled Treatment:    Reason Eval/Treat Not Completed: Other (comment) (Pt on max pressors with BP critically low and Hgb 6.7.)Nursing agrees that PT sign off.  Please reorder when pt is appropriate for PT again.  Thanks.   [name], [name] F 07/09/2014, 9:03 AM Eber Jonesawn Deaira Leckey,PT Acute Rehabilitation [phone] (pager)

## 2014-07-09 NOTE — Progress Notes (Signed)
Pt with frequent runs of SVT, converts back to ST with vagal maneuvers but returns back to SVT HR 150's, BP continues to be low. No signs of acute bleeding or distress.  CVP remains 10. Dr Vassie LollAlva notified, orders received to start neo gtt and wean levo. Stat CBC drawn at this time, awaiting results

## 2014-07-09 NOTE — Progress Notes (Signed)
eLink Physician-Brief Progress Note Patient Name: Etna C Desmith DOB: 12-22-1955 MRN: [id number]   Date of Service  07/09/2014  HPI/Events of Note  Ongoing hypotension, two pressors, no clear source; sepsis? Currently on antibiotics and anti-fungal therapy  eICU Interventions  Add hydrocortisone 50mg  IV q6h     Intervention Category Intermediate Interventions: Hypotension - evaluation and management  Pasqualino Witherspoon 07/09/2014, 1:19 AM

## 2014-07-09 NOTE — Progress Notes (Signed)
Dr. Arlean HoppingSchertz approved to give patient contrast for CT scan of abdomen and pelvis

## 2014-07-09 NOTE — Progress Notes (Signed)
PARENTERAL NUTRITION/ANTIBIOTIC CONSULT NOTE - Follow-up  Pharmacy Consult for TPN Indication: prolonged ileus  No Known Allergies  Patient Measurements: Height:  (165.1 cm) Weight: 212 lb 4.9 oz (96.3 kg) IBW/kg (Calculated) : 57  Adjusted body weight: 67  Vital Signs: Temp: 98 F (36.7 C) (04/07 0400) Temp Source: Axillary (04/07 0400) BP: 122/56 mmHg (04/07 0600) Intake/Output from previous day: 04/06 0701 - 04/07 0700 In: 4715.2 [I.V.:1042.9; IV Piggyback:900; TPN:2772.3] Out: 4457 [Emesis/NG output:375] Intake/Output from this shift:    Labs:  Recent Labs  07/07/14 0400  07/08/14 0434 07/08/14 2238 07/09/14 0430  WBC  --   < > 20.5* 23.3* PENDING  HGB  --   < > 7.5* 7.0* 6.7*  HCT  --   < > 22.9* 21.7* 21.2*  PLT  --   < > 42* 35* PENDING  APTT 41*  --   --   --   --   < > = values in this interval not displayed.   Recent Labs  07/06/14 1015  07/07/14 0400  07/08/14 0434 07/08/14 0447 07/08/14 1620 07/09/14 0430  NA  --   < > 126*  < >  --  130* 130* 130*  K  --   < > 3.5  < >  --  3.5 4.0 3.9  CL  --   < > 95*  < >  --  97 99 100  CO2  --   < > 25  < >  --  GLUCOSE  --   < > 194*  < >  --  173* 206* 201*  BUN  --   < > 70*  < >  --  44* 41* 37*  CREATININE  --   < > 4.10*  < >  --  2.44* 2.21* 2.11*  CALCIUM  --   < > 7.3*  < >  --  7.5* 7.3* 7.1*  MG  --   --  1.9  --  2.0  --   --  2.0  PHOS  --   < > 3.8  < >  --  2.3 1.7* 1.5*  PROT  --   --   --   --   --   --   --  4.9*  ALBUMIN  --   < > 1.4*  < >  --  1.3* 1.3* 1.2*  AST  --   --   --   --   --   --   --  17  ALT  --   --   --   --   --   --   --  29  ALKPHOS  --   --   --   --   --   --   --  111  BILITOT  --   --   --   --   --   --   --  2.0*  TRIG 88  --   --   --   --   --   --   --   < > = values in this interval not displayed. Estimated Creatinine Clearance: 33.4 mL/min (by C-G formula based on Cr of 2.11).    Recent Labs  07/08/14 1921 07/09/14 0008  07/09/14 0428  GLUCAP 131* 140* 181*    Insulin Requirements in the past 24 hours:  10 units Novolog SSI; 20 units regular insulin in TPN  Current Nutrition:  NPO Clinimix to 5/15 (w/o electrolytes) at 110 ml/hr and IVFE 20% at 3910ml/hr. This will provide ~2350 kcal and 132g of protein which will meet 100% of calorie and protein goals  Nutritional Goals: per RD 4/4 2200-2600 kCal, 120-140 grams of protein per day (maximize protein)  Assessment: 59 yo F with complicated hospital course. S/p closure perforated pyloric ulcer, SBR for ischemic necrosis, diffuse peritonitis 3/30. Pharmacy consulted to provide TPN for nutrition support.   GI: NPO, bowel rest. No flatus yet, awaiting return of bowel function. TPN. NG o/p 375 ml/24h. Prealbumin from 4/1 is 5. 4/4 prealbumin in process   Endo: CBGs slightly elevated (130-180s) Noted serum glucose of 206 and 201 but may be lab error. Also steroids started 4/7. SSI and regular insulin in TPN.  Lytes: Na at 130. K 3.9 (goal >4 with ileus), Mg 2.0 (goal >2 with ileus), Phos 1.5 - Corr Ca 9.3   Renal: ESRD, new start HD this admission. CRRT off 4/2 ~1400 due to filter clotting. Nephrology restarted CRRT on 4/4. I/O equal yesterday. MIVF: NS @ 7510ml/hr. Goal for negative fluid balance as tolerated.  Pulm: Pt extubated 4/2. 4L [place]  Cards: On phenylephrine @ 24200mcg/min. Levo off. BP ok. HR 90s. (NSR)  Hepatobil: LFTswnl. TG 88. Albumin 1.2  Neuro: New stroke noted on 3/18.   ID: vanc/zosyn/diflucan for possible PNA and peritonitis. S/p OR for perforated pyloric ulcer, evacuation of ascites and small bowel resection for ischemic necrosis. WBC pending, Afeb.  Vancomycin 3/29>  VR 4/3 - 38.5 Cefepime 3/29>3/30 Zosyn 3/30> 3/20 fluconazole>  3/30 peritoneal fluid- rare lactobacillus (no sensitivies performed; usually sensitive to PCN) 3/28 blood x2 - ngtd 3/36 c diff - neg  Best Practices: Lovenox, PPI IV  TPN Access: CVC  TPN day#:  7  Plan:  - Clinimix 5/15 (w/o electrolytes) at 110 ml/hr and IVFE 20% at 2310ml/hr. This will provide ~2350 kcal and 132g of protein which will meet 100% of calorie and protein goals - Change SSI to moderate and increase to 25 units regular insulin in TPN - Give Sodium Phos 20mmol IV x 1 - Standard MVI and trace elements daily in TPN - F/u TPN labs, bmet  Enzo BiNathan Karynn Deblasi, PharmD Clinical Pharmacist Resident Pager [phone] 07/09/2014 7:10 AM

## 2014-07-09 NOTE — Progress Notes (Signed)
OT Cancellation Note and Discharge  Patient Details Name: Chiffon C Cervone MRN: [id number] DOB: 01/16/1956   Cancelled Treatment:    Reason Eval/Treat Not Completed:  Pt with medical decline.Pt on max pressors with BP critically low and Hgb 6.7. Nursing agrees that OT sign off. Please reorder when pt is appropriate.  [name], Srikar Chiang Lynn 07/09/2014, 11:30 AM

## 2014-07-09 NOTE — Progress Notes (Signed)
CRITICAL VALUE ALERT  Critical value received:  hgb 6.7  Date of notification:  07/09/2014  Time of notification:  0526  Critical value read back:Yes.    Nurse who received alert:  Yerik Zeringue, Lytle ButteStefani LaHaie  MD notified (1st page):  Dr Kendrick FriesMcquaid  Time of first page:  0526  Time MD responded:  0528   Orders received

## 2014-07-09 NOTE — Progress Notes (Signed)
   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 Kalman ShanMurali Mischele Detter, M.D., [facility] Ltd[facility]C.P Pulmonary and Critical Care Medicine Staff Physician [facility] [place] [facility] Pager: [phone], If no answer or between  15:00h - 7:00h: call [phone]  07/09/2014 5:29 PM

## 2014-07-10 ENCOUNTER — Encounter (HOSPITAL_COMMUNITY): Payer: Self-pay | Admitting: Radiology

## 2014-07-10 ENCOUNTER — Inpatient Hospital Stay (HOSPITAL_COMMUNITY): Payer: Medicaid Other

## 2014-07-10 DIAGNOSIS — R188 Other ascites: Secondary | ICD-10-CM | POA: Insufficient documentation

## 2014-07-10 LAB — RENAL FUNCTION PANEL
ALBUMIN: 1.3 g/dL — AB (ref 3.5–5.2)
ALBUMIN: 1.6 g/dL — AB (ref 3.5–5.2)
ANION GAP: 9 (ref 5–15)
Anion gap: 9 (ref 5–15)
BUN: 38 mg/dL — ABNORMAL HIGH (ref 6–23)
BUN: 43 mg/dL — ABNORMAL HIGH (ref 6–23)
CHLORIDE: 95 mmol/L — AB (ref 96–112)
CO2: 23 mmol/L (ref 19–32)
CO2: 24 mmol/L (ref 19–32)
CREATININE: 2.02 mg/dL — AB (ref 0.50–1.10)
Calcium: 7.6 mg/dL — ABNORMAL LOW (ref 8.4–10.5)
Calcium: 8 mg/dL — ABNORMAL LOW (ref 8.4–10.5)
Chloride: 97 mmol/L (ref 96–112)
Creatinine, Ser: 1.99 mg/dL — ABNORMAL HIGH (ref 0.50–1.10)
GFR calc Af Amer: 31 mL/min — ABNORMAL LOW (ref >90)
GFR, EST AFRICAN AMERICAN: 30 mL/min — AB (ref >90)
GFR, EST NON AFRICAN AMERICAN: 26 mL/min — AB (ref >90)
GFR, EST NON AFRICAN AMERICAN: 26 mL/min — AB (ref >90)
Glucose, Bld: 185 mg/dL — ABNORMAL HIGH (ref 70–99)
Glucose, Bld: 207 mg/dL — ABNORMAL HIGH (ref 70–99)
PHOSPHORUS: 2.2 mg/dL — AB (ref 2.3–4.6)
POTASSIUM: 4 mmol/L (ref 3.5–5.1)
Phosphorus: 3.1 mg/dL (ref 2.3–4.6)
Potassium: 3.9 mmol/L (ref 3.5–5.1)
SODIUM: 129 mmol/L — AB (ref 135–145)
Sodium: 128 mmol/L — ABNORMAL LOW (ref 135–145)

## 2014-07-10 LAB — CBC WITH DIFFERENTIAL/PLATELET
Basophils Absolute: 0 10*3/uL (ref 0.0–0.1)
Basophils Relative: 0 % (ref 0–1)
EOS PCT: 0 % (ref 0–5)
Eosinophils Absolute: 0 10*3/uL (ref 0.0–0.7)
HCT: 22.6 % — ABNORMAL LOW (ref 36.0–46.0)
HEMOGLOBIN: 7.3 g/dL — AB (ref 12.0–15.0)
LYMPHS ABS: 0.8 10*3/uL (ref 0.7–4.0)
Lymphocytes Relative: 3 % — ABNORMAL LOW (ref 12–46)
MCH: 26.2 pg (ref 26.0–34.0)
MCHC: 32.3 g/dL (ref 30.0–36.0)
MCV: 81 fL (ref 78.0–100.0)
MONOS PCT: 5 % (ref 3–12)
Monocytes Absolute: 1.3 10*3/uL — ABNORMAL HIGH (ref 0.1–1.0)
Neutro Abs: 24.3 10*3/uL — ABNORMAL HIGH (ref 1.7–7.7)
Neutrophils Relative %: 92 % — ABNORMAL HIGH (ref 43–77)
Platelets: 40 10*3/uL — ABNORMAL LOW (ref 150–400)
RBC: 2.79 MIL/uL — ABNORMAL LOW (ref 3.87–5.11)
RDW: 19.9 % — ABNORMAL HIGH (ref 11.5–15.5)
WBC: 26.4 10*3/uL — AB (ref 4.0–10.5)

## 2014-07-10 LAB — GLUCOSE, CAPILLARY
GLUCOSE-CAPILLARY: 136 mg/dL — AB (ref 70–99)
GLUCOSE-CAPILLARY: 200 mg/dL — AB (ref 70–99)
GLUCOSE-CAPILLARY: 207 mg/dL — AB (ref 70–99)
Glucose-Capillary: 174 mg/dL — ABNORMAL HIGH (ref 70–99)
Glucose-Capillary: 156 mg/dL — ABNORMAL HIGH (ref 70–99)
Glucose-Capillary: 160 mg/dL — ABNORMAL HIGH (ref 70–99)

## 2014-07-10 LAB — TYPE AND SCREEN
ABO/RH(D): B POS
Antibody Screen: NEGATIVE
UNIT DIVISION: 0

## 2014-07-10 LAB — APTT: APTT: 37 s (ref 24–37)

## 2014-07-10 LAB — PROTIME-INR
INR: 1.21 (ref 0.00–1.49)
Prothrombin Time: 15.4 seconds — ABNORMAL HIGH (ref 11.6–15.2)

## 2014-07-10 LAB — MAGNESIUM: Magnesium: 2.2 mg/dL (ref 1.5–2.5)

## 2014-07-10 MED ORDER — SODIUM PHOSPHATE 3 MMOLE/ML IV SOLN
20.0000 mmol | Freq: Once | INTRAVENOUS | Status: AC
  Administered 2014-07-10: 20 mmol via INTRAVENOUS
  Filled 2014-07-10: qty 6.67

## 2014-07-10 MED ORDER — LIDOCAINE HCL 1 % IJ SOLN
INTRAMUSCULAR | Status: AC
  Filled 2014-07-10: qty 20

## 2014-07-10 MED ORDER — FAT EMULSION 20 % IV EMUL
240.0000 mL | INTRAVENOUS | Status: AC
  Administered 2014-07-10: 240 mL via INTRAVENOUS
  Filled 2014-07-10: qty 250

## 2014-07-10 MED ORDER — TRACE MINERALS CR-CU-F-FE-I-MN-MO-SE-ZN IV SOLN
INTRAVENOUS | Status: AC
  Administered 2014-07-10: 17:00:00 via INTRAVENOUS
  Filled 2014-07-10: qty 2640

## 2014-07-10 MED ORDER — MIDAZOLAM HCL 2 MG/2ML IJ SOLN
INTRAMUSCULAR | Status: AC | PRN
  Administered 2014-07-10: 1 mg via INTRAVENOUS

## 2014-07-10 MED ORDER — MIDAZOLAM HCL 2 MG/2ML IJ SOLN
INTRAMUSCULAR | Status: AC
  Filled 2014-07-10: qty 2

## 2014-07-10 MED ORDER — SODIUM CHLORIDE 0.9 % IV SOLN: Freq: Once | INTRAVENOUS | Status: DC

## 2014-07-10 MED ORDER — FENTANYL CITRATE 0.05 MG/ML IJ SOLN
INTRAMUSCULAR | Status: AC | PRN
  Administered 2014-07-10: 50 ug via INTRAVENOUS

## 2014-07-10 MED ORDER — FENTANYL CITRATE 0.05 MG/ML IJ SOLN
INTRAMUSCULAR | Status: AC
  Filled 2014-07-10: qty 2

## 2014-07-10 NOTE — Sedation Documentation (Signed)
Patient denies pain and is resting comfortably.  

## 2014-07-10 NOTE — Progress Notes (Signed)
PARENTERAL NUTRITION/ANTIBIOTIC CONSULT NOTE - Follow-up  Pharmacy Consult for TPN Indication: prolonged ileus  No Known Allergies  Patient Measurements: Height: 5\' 5"  (165.1 cm) Weight: 215 lb 2.7 oz (97.6 kg) IBW/kg (Calculated) : 57  Adjusted body weight: 67  Vital Signs: Temp: 97.5 F (36.4 C) (04/08 0400) Temp Source: Axillary (04/08 0400) BP: 100/79 mmHg (04/08 0700) Intake/Output from previous day: 04/07 0701 - 04/08 0700 In: 5115.7 [I.V.:1239.7; Blood:335; IV Piggyback:901; TPN:2640] Out: 4251 [Emesis/NG output:350; Drains:25] Intake/Output from this shift:    Labs:  Recent Labs  07/08/14 2238 07/09/14 0430 07/10/14 0415  WBC 23.3* 25.6* 26.4*  HGB 7.0* 6.7* 7.3*  HCT 21.7* 21.2* 22.6*  PLT 35* 31* 40*     Recent Labs  07/08/14 0434  07/09/14 0430 07/09/14 0500 07/09/14 1730 07/10/14 0415  NA  --   < > 130* 128* 128* 129*  K  --   < > 3.9 3.9 3.9 4.0  CL  --   < > 100 98 97 97  CO2  --   < > 20 21 21 23   GLUCOSE  --   < > 201* 198* 188* 185*  BUN  --   < > 37* 38* 34* 38*  CREATININE  --   < > 2.11* 2.08* 1.93* 1.99*  CALCIUM  --   < > 7.1* 6.7* 7.3* 7.6*  MG 2.0  --  2.0  --   --  2.2  PHOS  --   < > 1.5* 1.5* 2.2* 2.2*  PROT  --   --  4.9*  --   --   --   ALBUMIN  --   < > 1.2* 1.2* 1.3* 1.3*  AST  --   --  17  --   --   --   ALT  --   --  29  --   --   --   ALKPHOS  --   --  111  --   --   --   BILITOT  --   --  2.0*  --   --   --   < > = values in this interval not displayed. Estimated Creatinine Clearance: 35.6 mL/min (by C-G formula based on Cr of 1.99).    Recent Labs  07/09/14 2111 07/10/14 0003 07/10/14 0413  GLUCAP 132* 136* 174*    Insulin Requirements in the past 24 hours:  9 units Novolog moderate SSI; 25 units regular insulin in TPN  Current Nutrition:  NPO Clinimix to 5/15 (w/o electrolytes) at 110 ml/hr and IVFE 20% at 6910ml/hr. This will provide ~2350 kcal and 132g of protein which will meet 100% of calorie and  protein goals  Nutritional Goals: per RD 4/4 2200-2600 kCal, 120-140 grams of protein per day (maximize protein)  Assessment: 59 yo F with complicated hospital course. S/p closure perforated pyloric ulcer, SBR for ischemic necrosis, diffuse peritonitis 3/30. Pharmacy consulted to provide TPN for nutrition support.   GI: NPO, bowel rest. No flatus yet, awaiting return of bowel function. TPN. NG o/p 350 ml/24h. Drains o/p 3.8 L. Albumin 1.3. Prealbumin from 4/1 is 5. 4/4 prealbumin is up to 10.   Endo: CBGs slightly elevated (120-180s) Hydrocortisone started 4/7. Moderate SSI and regular insulin in TPN.   Lytes: Na at 129. K 4.0 (goal >4 with ileus), Mg 2.2 (goal >2 with ileus), Phos 2.2 - Corr Ca 9.8   Renal: ESRD, new start HD this admission. CRRT off 4/2 ~1400 due to filter  clotting. Nephrology restarted CRRT on 4/4. I/O net positive 1 L yesterday. MIVF: NS @ 18ml/hr. Goal for negative fluid balance as tolerated.  Pulm: Pt extubated 4/2. 4L [place]  Cards: On phenylephrine @ 73mcg/min. Levo off. BP ok. HR 90s. (NSR)  Hepatobil: LFTswnl. TG 88.  Neuro: New stroke noted on 3/18.   ID: Day # 10 of vanc/zosyn/diflucan for peritonitis. S/p OR for perforated pyloric ulcer, evacuation of ascites and small bowel resection for ischemic necrosis. WBC 26.4, Afeb.  Vancomycin 3/29>  VR 4/3 - 38.5 Cefepime 3/29>3/30 Zosyn 3/30> 3/20 fluconazole>  3/30 peritoneal fluid- rare lactobacillus (no sensitivies performed; usually sensitive to PCN) 3/28 blood x2 - ngtd 3/36 c diff - neg  Best Practices: Lovenox, PPI IV  TPN Access: CVC  TPN day#: 8  Plan:  - Clinimix 5/15 (w/o electrolytes) at 110 ml/hr and IVFE 20% at 48ml/hr. This will provide ~2350 kcal and 132g of protein which will meet 100% of calorie and protein goals - Continue moderate SSI and 25 units regular insulin in TPN - Give Sodium Phos IV x 1 - Standard MVI and trace elements daily in TPN - F/U TPN labs, phos with renal  labs  Enzo Bi, PharmD Clinical Pharmacist Resident Pager [phone] 07/10/2014 7:05 AM

## 2014-07-10 NOTE — Progress Notes (Signed)
General Surgery Note  LOS: 23 days  POD -  9 Days Post-Op  Assessment/Plan: 1.  EXPLORATORY LAPAROTOMY WITH CLOSURE OF PERFORATED PYLORIC ULCER, SMALL BOWEL RESECTION - 07/01/2014 [name]  Diflucan/Zosyn/Vanc  Has NGT - no BM   1B.  Open wound - clean -  1C.  Pelvic fluid collection  CT repeated last PM which is suspicious for developing abscess.  For perc drain today.   2.  Leukocytosis  WBC - 26,400 - 07/10/2014  3.  Anemia - 7.3 - 07/09/2014 4.  ESRD - Creat - 1.99 - 07/10/2014  On CVVH 5.  Nutrition - on TPN 6.  Stroke - 06/19/2014  Left hemiplegia - this will probably limit her mobility and return to normal activities 7.  Activity - PT ordered 8.  On Solucortef -  Just started 4/7 - discussed with resident    Principal Problem:   Acute respiratory failure with hypoxia Active Problems:   Cerebral thrombosis with cerebral infarction   Hypertension   ESRD needing dialysis   Obesity (BMI 30-39.9)   Depression   Left renal mass   Chronic diastolic heart failure   H1N1 influenza   Tobacco use disorder   Renal failure (ARF), acute on chronic   Hyperlipidemia   PSVT (paroxysmal supraventricular tachycardia)   Hypoxia   SOB (shortness of breath)   Perforated gastric ulcer  Subjective:  Doing okay.    Objective:   Filed Vitals:   07/10/14 0800  BP: 115/74  Pulse:   Temp:   Resp: 18     Intake/Output from previous day:  04/07 0701 - 04/08 0700 In: 5309.5 [I.V.:1263.5; Blood:335; IV Piggyback:951; TPN:2760] Out: 4441 [Emesis/NG output:350; Drains:25]  Intake/Output this shift:  Total I/O In: 148.8 [I.V.:28.8; TPN:120] Out: 164 [Other:164]   Physical Exam:   General: [name] who is alert and oriented.    HEENT: Normal. Pupils equal. .   Lungs: Wheezes.  Has right subclavian line.   Abdomen: Soft.  Rare BS.  Drain in RLQ - 25 cc recorded last 24 hours.   Wound: Clean.   Lab Results:     Recent Labs  07/09/14 0430 07/10/14 0415  WBC 25.6* 26.4*  HGB  6.7* 7.3*  HCT 21.2* 22.6*  PLT 31* 40*    BMET    Recent Labs  07/09/14 1730 07/10/14 0415  NA 128* 129*  K 3.9 4.0  CL 97 97  CO2 21 23  GLUCOSE 188* 185*  BUN 34* 38*  CREATININE 1.93* 1.99*  CALCIUM 7.3* 7.6*    PT/INR    Recent Labs  07/10/14 0715  LABPROT 15.4*  INR 1.21    ABG  No results for input(s): PHART, HCO3 in the last 72 hours.  Invalid input(s): PCO2, PO2   Studies/Results:  Ct Abdomen Pelvis W Contrast  07/09/2014   CLINICAL DATA:  Ongoing sepsis. Evaluate for abscess following recent abdominal surgery for small bowel perforation. End-stage renal disease.  EXAM: CT ABDOMEN AND PELVIS WITH CONTRAST  TECHNIQUE: Multidetector CT imaging of the abdomen and pelvis was performed using the standard protocol following bolus administration of intravenous contrast.  CONTRAST:  80mL OMNIPAQUE IOHEXOL 300 MG/ML  SOLN  COMPARISON:  Prior CTs 07/01/2014 and 07/06/2014.  FINDINGS: Lower chest: Left pleural effusion and adjacent left lower lobe atelectasis appear mildly improved. There is no pericardial effusion. The heart is moderately enlarged.  Hepatobiliary: Contrast bolus is limited. No focal hepatic abnormalities identified. No evidence of gallstones, gallbladder wall thickening  or biliary dilatation.  Pancreas: Stable appearance. A small fatty cleft within the pancreatic head is unchanged. No focal surrounding inflammatory change.  Spleen: Normal in size without focal abnormality.  Adrenals/Urinary Tract: Both adrenal glands appear normal.The kidneys appear stable. There is a stable cyst involving the interpolar region of the left kidney. There is limited contrast excretion from both kidneys. Perinephric soft tissue stranding is stable. There is no evidence of urinary tract calculus. The bladder appears unremarkable.  Stomach/Bowel: Nasogastric tube extends into the mid stomach. The stomach and small bowel are decompressed status post partial small bowel resection. There is  contrast material within the colon. The sigmoid colon demonstrates diverticulosis and wall thickening. No extravasated enteric contrast or evidence of bowel obstruction.There is a surgical drain within the upper abdomen. Ascites is again noted with multiple interloop components. In the pelvis, there is a better defined collection in the cul-de-sac which has slightly enlarged. This measures 6.2 x 9.0 cm transverse and is associated with mild enhancement of its margins. This may reflect a developing abscess. No other focal fluid collections are identified. There is a small amount of air within the peritoneal cavity attributed to the recent surgery and/or drain.  Vascular/Lymphatic: There are no enlarged abdominal or pelvic lymph nodes. Diffuse atherosclerosis is again noted with dilatation of the abdominal aorta to 3.0 cm. No large vessel occlusion identified.  Reproductive: Multiple partially calcified uterine fibroids are noted. No evidence of adnexal mass.  Other: Open incision within the anterior abdominal wall again noted.  Musculoskeletal: No acute or significant osseous findings.  IMPRESSION: 1. Enlarging fluid collection within the pelvic cul-de-sac is associated with enhancement of its peritoneal margins and may reflect a developing abscess. This may be amenable to trans gluteal drainage. 2. The additional components of ascites throughout the peritoneal cavity are stable without other focal collection. 3. No evidence of bowel obstruction or extravasated enteric contrast status post partial small bowel resection.   Electronically Signed   By: Carey BullocksWilliam  Veazey M.D.   On: 07/09/2014 21:10   Dg Chest Port 1 View  07/09/2014   CLINICAL DATA:  End-stage renal disease, pulmonary edema, acute respiratory failure.  EXAM: PORTABLE CHEST - 1 VIEW  COMPARISON:  Portable chest x-ray of July 07, 2014  FINDINGS: The patient is somewhat rotated toward the left. The right lung is well-expanded. There is persistent increased  density in the left lower lobe with better visualization of the hemidiaphragm however. The cardiac silhouette remains enlarged. The central pulmonary vascularity is engorged. A small left pleural effusion is suspected.  The endotracheal tube tip lies approximately 4.9 cm above the carina. The dialysis type catheter tip projects over the midportion of the SVC. The left internal jugular venous catheter tip projects at the junction of the left internal jugular vein with the left subclavian vein. The esophagogastric tube tip projects below the inferior margin of the image.  IMPRESSION: There has been slight interval improvement in the appearance of the left lower lobe. There is persistent atelectasis or pneumonia with small left pleural effusion. There is also mild overall improvement in the pulmonary interstitial edema.   Electronically Signed   By: Tenille Morrill  [place]   On: 07/09/2014 07:23     Anti-infectives:   Anti-infectives    Start     Dose/Rate Route Frequency Ordered Stop   07/07/14 1200  vancomycin (VANCOCIN) IVPB 1000 mg/200 mL premix     1,000 mg 200 mL/hr over 60 Minutes Intravenous Every 24 hours 07/07/14 0937  07/06/14 2000  fluconazole (DIFLUCAN) IVPB 400 mg     400 mg 100 mL/hr over 120 Minutes Intravenous Every 24 hours 07/06/14 1507     07/06/14 1800  piperacillin-tazobactam (ZOSYN) IVPB 2.25 g     2.25 g 100 mL/hr over 30 Minutes Intravenous 4 times per day 07/06/14 1505     07/05/14 2000  fluconazole (DIFLUCAN) IVPB 200 mg  Status:  Discontinued     200 mg 100 mL/hr over 60 Minutes Intravenous Every 24 hours 07/05/14 0757 07/06/14 1507   07/05/14 1400  piperacillin-tazobactam (ZOSYN) IVPB 2.25 g  Status:  Discontinued     2.25 g 100 mL/hr over 30 Minutes Intravenous 3 times per day 07/05/14 0749 07/06/14 1505   07/02/14 1800  vancomycin (VANCOCIN) IVPB 1000 mg/200 mL premix  Status:  Discontinued     1,000 mg 200 mL/hr over 60 Minutes Intravenous Every 24 hours 07/01/14 1858  07/05/14 0801   07/01/14 2200  piperacillin-tazobactam (ZOSYN) IVPB 3.375 g  Status:  Discontinued     3.375 g 100 mL/hr over 30 Minutes Intravenous 4 times per day 07/01/14 1858 07/05/14 0749   07/01/14 2000  fluconazole (DIFLUCAN) IVPB 400 mg  Status:  Discontinued     400 mg 100 mL/hr over 120 Minutes Intravenous Every 24 hours 07/01/14 1858 07/05/14 0757   07/01/14 1400  fluconazole (DIFLUCAN) IVPB 200 mg  Status:  Discontinued     200 mg 100 mL/hr over 60 Minutes Intravenous Every 24 hours 07/01/14 1330 07/01/14 1853   07/01/14 1000  piperacillin-tazobactam (ZOSYN) IVPB 2.25 g  Status:  Discontinued     2.25 g 100 mL/hr over 30 Minutes Intravenous Every 8 hours 07/01/14 0952 07/01/14 1853   07/01/14 1000  vancomycin (VANCOCIN) 500 mg in sodium chloride 0.9 % 100 mL IVPB     500 mg 100 mL/hr over 60 Minutes Intravenous  Once 07/01/14 0952 07/01/14 1126   06/30/14 1200  vancomycin (VANCOCIN) IVPB 1000 mg/200 mL premix     1,000 mg 200 mL/hr over 60 Minutes Intravenous  Once 06/30/14 0944 06/30/14 1403   06/30/14 1200  ceFEPIme (MAXIPIME) 2 g in dextrose 5 % 50 mL IVPB     2 g 100 mL/hr over 30 Minutes Intravenous  Once 06/30/14 0944 06/30/14 1425   06/30/14 0100  vancomycin (VANCOCIN) 2,000 mg in sodium chloride 0.9 % 500 mL IVPB     2,000 mg 250 mL/hr over 120 Minutes Intravenous  Once 06/30/14 0048 06/30/14 0525   06/30/14 0100  ceFEPIme (MAXIPIME) 2 g in dextrose 5 % 50 mL IVPB     2 g 100 mL/hr over 30 Minutes Intravenous  Once 06/30/14 0048 06/30/14 0322   06/27/14 0600  cefUROXime (ZINACEF) 1.5 g in dextrose 5 % 50 mL IVPB     1.5 g 100 mL/hr over 30 Minutes Intravenous On call to O.R. 06/26/14 1019 06/27/14 0630   06/19/14 1800  oseltamivir (TAMIFLU) capsule 30 mg     30 mg Oral Every M-W-F (1800) 06/19/14 1003 06/22/14 1841   06/18/14 1600  oseltamivir (TAMIFLU) capsule 30 mg  Status:  Discontinued     30 mg Oral Daily 06/18/14 1538 06/19/14 1003      Stephanie Kin,  MD, FACS Pager: [phone] [facility] Office: [phone] 07/10/2014

## 2014-07-10 NOTE — Procedures (Signed)
Successful CT GUIDED pelvic abscess drain insertion 40cc serosang fld aspirated GS/CX sent No comp Stable Full report in PACS

## 2014-07-10 NOTE — Consult Note (Signed)
Chief Complaint: Chief Complaint  Patient presents with  . Shortness of Breath  . Asthma  . Cough  Intra abdominal abscess  Referring Physician(s): Dr Ezzard Standing  History of Present Illness: [name] is a 59 y.o. female   Pt with perforated ulcer repair And small bowel resection surgery 07/01/14 Continues with abd pain Leukocytosis CVA ESRD Smoker Request made for percutaneous abscess drain placement in IR Dr Miles Costain has reviewed imaging and approves procedure I have seen and examined pt Plt 40 this am   Past Medical History  Diagnosis Date  . Asthma   . Hypertension   . Gout   . Arthritis   . Insomnia   . Chronic kidney disease   . Depression     Past Surgical History  Procedure Laterality Date  . Multiple tooth extractions    . Av fistula placement Left 01/28/2014    Procedure: ARTERIOVENOUS (AV) FISTULA CREATION;  Surgeon: Sherren Kerns, MD;  Location: [facility] OR;  Service: Vascular;  Laterality: Left;  . Tee without cardioversion N/A 06/23/2014    Procedure: TRANSESOPHAGEAL ECHOCARDIOGRAM (TEE);  Surgeon: Lewayne Bunting, MD;  Location: [facility] ENDOSCOPY;  Service: Cardiovascular;  Laterality: N/A;  . Insertion of dialysis catheter Right 06/27/2014    Procedure: INSERTION OF Right Internal Jugular DIATEK CATHETER;  Surgeon: Pryor Ochoa, MD;  Location: [facility] OR;  Service: Vascular;  Laterality: Right;  . Laparotomy N/A 07/01/2014    Procedure: EXPLORATORY LAPAROTOMY WITH CLOSURE OF PERFORATED PYLORIC ULCER;  Surgeon: Claud Kelp, MD;  Location: [facility] OR;  Service: General;  Laterality: N/A;  . Bowel resection N/A 07/01/2014    Procedure: SMALL BOWEL RESECTION;  Surgeon: Claud Kelp, MD;  Location: [facility] OR;  Service: General;  Laterality: N/A;    Allergies: Review of patient's allergies indicates no known allergies.  Medications: Prior to Admission medications   Medication Sig Start Date End Date Taking? Authorizing Provider  allopurinol (ZYLOPRIM) 100 MG  tablet Take 100 mg by mouth 2 (two) times daily as needed (for gout).   Yes Historical Provider, MD  citalopram (CELEXA) 20 MG tablet Take 20 mg by mouth daily.   Yes Historical Provider, MD  furosemide (LASIX) 80 MG tablet Take 80 mg by mouth daily.   Yes Historical Provider, MD  hydrOXYzine (ATARAX/VISTARIL) 25 MG tablet Take 25 mg by mouth 3 (three) times daily as needed for anxiety.   Yes Historical Provider, MD  oxyCODONE (ROXICODONE) 5 MG immediate release tablet Take 1 tablet (5 mg total) by mouth every 6 (six) hours as needed for severe pain. Patient not taking: Reported on 06/17/2014 01/28/14   Raymond Gurney, PA-C     Family History  Problem Relation Age of Onset  . Diabetes Mother   . Hypertension Mother   . Heart disease Mother   . Heart attack Mother   . Peripheral vascular disease Mother   . Diabetes Father   . Heart disease Father   . Hypertension Father   . Diabetes Sister   . Hypertension Sister   . Heart disease Sister     before age 56  . Heart attack Sister   . Peripheral vascular disease Sister   . Heart disease Brother   . Hyperlipidemia Daughter     History   Social History  . Marital Status: Single    Spouse Name: N/A  . Number of Children: N/A  . Years of Education: N/A   Social History Main Topics  . Smoking status:  Current Every Day Smoker -- 0.50 packs/day for 20 years    Types: Cigarettes  . Smokeless tobacco: Never Used  . Alcohol Use: Yes     Comment: occ  . Drug Use: No  . Sexual Activity: Not on file   Other Topics Concern  . None   Social History Narrative     Review of Systems: A 12 point ROS discussed and pertinent positives are indicated in the HPI above.  All other systems are negative.  Review of Systems  Constitutional: Positive for activity change and fatigue. Negative for fever.  Respiratory: Positive for shortness of breath and wheezing.   Cardiovascular: Negative for chest pain.  Gastrointestinal: Positive for  nausea, abdominal pain and abdominal distention.  Neurological: Positive for weakness.    Vital Signs: BP 122/72 mmHg  Pulse 97  Temp(Src) 97.2 F (36.2 C) (Oral)  Resp 15  Ht 5\' 5"  (1.651 m)  Wt 97.6 kg (215 lb 2.7 oz)  BMI 35.81 kg/m2  SpO2 60%  Physical Exam  Cardiovascular: Normal rate and regular rhythm.   No murmur heard. Pulmonary/Chest: Effort normal. She has wheezes.  Abdominal: Soft. There is tenderness.  Musculoskeletal:  L sided weakness  Neurological: She is alert.  Skin: Skin is warm and dry.  Psychiatric: Judgment and thought content normal.  Pt unable to sign legibly---weakness Consented with dtr via phone  Nursing note and vitals reviewed.   Mallampati Score:  MD Evaluation Airway: WNL Heart: WNL Heart  comments: Intermittent ectopic atrial tachycardia Abdomen: WNL Chest/ Lungs: WNL ASA  Classification: 3 Mallampati/Airway Score: Two  Imaging: Ct Abdomen Pelvis Wo Contrast  07/06/2014   CLINICAL DATA:  Followup surgery for recent small bowel perforation. Elevated white blood cell count.  EXAM: CT ABDOMEN AND PELVIS WITHOUT CONTRAST  TECHNIQUE: Multidetector CT imaging of the abdomen and pelvis was performed following the standard protocol without IV contrast.  COMPARISON:  07/01/2014  FINDINGS: Lower chest: Small left pleural effusion with overlying atelectasis. There is an NG tube in the stomach.  Hepatobiliary: No focal hepatic lesions or intrahepatic biliary dilatation. The gallbladder demonstrates some high attenuation material which could be vicarious excretion of contrast or sludge.  Pancreas: Grossly normal and stable. Small fatty cleft is noted in the pancreatic head.  Spleen: Normal size.  No focal lesions.  Adrenals/Urinary Tract: The adrenal glands and kidneys are stable.  Stomach/Bowel: The stomach, duodenum, small bowel and colon are unremarkable. No leaking oral contrast. Small amount of free air is likely due to the upper abdominal drainage  catheter. Residual free fluid is noted in the abdomen and pelvis likely related to recent surgery. It measures as simple fluid.  Vascular/Lymphatic: No mesenteric or retroperitoneal mass or adenopathy. Stable infrarenal abdominal aortic aneurysm.  Other: Moderate free pelvic fluid. The uterus and ovaries are unremarkable. No pelvic mass or adenopathy. No inguinal adenopathy.  Musculoskeletal: No significant bony findings.  IMPRESSION: Expected postoperative changes. Residual free fluid in the abdomen/ pelvis but no leaking oral contrast or significant free air. No findings for small bowel obstruction.   Electronically Signed   By: Rudie Meyer M.D.   On: 07/06/2014 18:24   Ct Abdomen Pelvis Wo Contrast  07/01/2014   CLINICAL DATA:  Renal failure and hypertension.  Respiratory failure  EXAM: CT CHEST, ABDOMEN AND PELVIS WITHOUT CONTRAST  TECHNIQUE: Multidetector CT imaging of the chest, abdomen and pelvis was performed following the standard protocol without IV contrast.  COMPARISON:  None.  FINDINGS: CT CHEST FINDINGS  THORACIC INLET/BODY WALL:  Right IJ central line is in good position.  MEDIASTINUM:  Cardiomegaly which is mild. No pericardial effusion. Diffuse atherosclerosis, including the coronary arteries. No evidence of acute vascular abnormality.  LUNG WINDOWS:  Complete opacification of the left lower lobe with volume loss. Trace left pleural effusion. Patchy nodular densities in the posterior mid lungs is are likely atelectatic or inflammatory.  OSSEOUS:  No acute fracture.  No suspicious lytic or blastic lesions.  CT ABDOMEN AND PELVIS FINDINGS  BODY WALL: No contributory findings.  Liver: Flattened appearance of the liver margins of uncertain significance. Given the fluid level, a subcapsular collection is not suspected.  Biliary: No evidence of biliary obstruction or stone.  Pancreas: Unremarkable.  Spleen: Unremarkable.  Adrenals: Unremarkable.  Kidneys and ureters: Symmetric perinephric edema.  There is bilateral smooth renal atrophy. An 18 mm presumed cyst is present in the interpolar left kidney. No hydronephrosis.  Bladder: Unremarkable.  Reproductive: Calcified fibroid in the posterior uterus measuring approximately 2 cm.  Bowel: There is probable pneumatosis of pelvic small bowel loops, the presumed source of large pneumoperitoneum. There is also moderate ascites throughout the abdomen, with bubbles of non floating gas in the rectovaginal recess, likely feculent material. No obstruction above the pneumatosis. No portal venous gas.  Vascular: 29 mm infrarenal fusiform aortic aneurysm. No visible vascular findings to explain the above.  OSSEOUS: No acute abnormalities.  Critical Value/emergent results were called by telephone at the time of interpretation on 07/01/2014 at 1:08 pm to Dr. Danford Bad , who verbally acknowledged these results.  IMPRESSION: 1. Perforated bowel with large pneumoperitoneum. Ileal pneumatosis suggests small bowel ischemia/necrosis. In this patient with recent embolic stroke, suspect mesenteric embolus. 2. Moderate ascites with feculent material or developing abscess in the rectovaginal recess. 3. Left lower lobe collapse. 4. 29 mm infrarenal aortic aneurysm.   Electronically Signed   By: Marnee Spring M.D.   On: 07/01/2014 13:17   Dg Chest 2 View  06/17/2014   CLINICAL DATA:  Shortness of breath and chest tenderness for 1 week. Oxygen dependent.  EXAM: CHEST  2 VIEW  COMPARISON:  Chest radiograph 01/28/2014.  FINDINGS: Increasing cardiomegaly. No focal infiltrates or congestive failure. No effusion or pneumothorax. Bones unremarkable.  IMPRESSION: Increasing cardiomegaly.  No active infiltrates or failure.   Electronically Signed   By: Davonna Belling M.D.   On: 06/17/2014 17:18   Ct Head Wo Contrast  06/19/2014   CLINICAL DATA:  Left-sided weakness.  Dialysis patient.  EXAM: CT HEAD WITHOUT CONTRAST  TECHNIQUE: Contiguous axial images were obtained from the base of  the skull through the vertex without intravenous contrast.  COMPARISON:  None  FINDINGS: Ill-defined hypodensity in the right medial frontal lobe consistent with acute infarct in the right anterior cerebral artery territory. No associated hemorrhage.  Negative for chronic ischemia. Ventricle size is normal. Negative for hemorrhage or mass lesion.  Calvarium intact.  Retention cyst in the left sphenoid sinus.  IMPRESSION: Acute infarct right anterior cerebral artery territory.  Critical Value/emergent results were called by telephone at the time of interpretation on 06/19/2014 at 12:53 pm to Dr. Billy Fischer , who verbally acknowledged these results.   Electronically Signed   By: Marlan Palau M.D.   On: 06/19/2014 12:53   Ct Chest Wo Contrast  07/01/2014   CLINICAL DATA:  Renal failure and hypertension.  Respiratory failure  EXAM: CT CHEST, ABDOMEN AND PELVIS WITHOUT CONTRAST  TECHNIQUE: Multidetector CT imaging of the chest, abdomen  and pelvis was performed following the standard protocol without IV contrast.  COMPARISON:  None.  FINDINGS: CT CHEST FINDINGS  THORACIC INLET/BODY WALL:  Right IJ central line is in good position.  MEDIASTINUM:  Cardiomegaly which is mild. No pericardial effusion. Diffuse atherosclerosis, including the coronary arteries. No evidence of acute vascular abnormality.  LUNG WINDOWS:  Complete opacification of the left lower lobe with volume loss. Trace left pleural effusion. Patchy nodular densities in the posterior mid lungs is are likely atelectatic or inflammatory.  OSSEOUS:  No acute fracture.  No suspicious lytic or blastic lesions.  CT ABDOMEN AND PELVIS FINDINGS  BODY WALL: No contributory findings.  Liver: Flattened appearance of the liver margins of uncertain significance. Given the fluid level, a subcapsular collection is not suspected.  Biliary: No evidence of biliary obstruction or stone.  Pancreas: Unremarkable.  Spleen: Unremarkable.  Adrenals: Unremarkable.  Kidneys and  ureters: Symmetric perinephric edema. There is bilateral smooth renal atrophy. An 18 mm presumed cyst is present in the interpolar left kidney. No hydronephrosis.  Bladder: Unremarkable.  Reproductive: Calcified fibroid in the posterior uterus measuring approximately 2 cm.  Bowel: There is probable pneumatosis of pelvic small bowel loops, the presumed source of large pneumoperitoneum. There is also moderate ascites throughout the abdomen, with bubbles of non floating gas in the rectovaginal recess, likely feculent material. No obstruction above the pneumatosis. No portal venous gas.  Vascular: 29 mm infrarenal fusiform aortic aneurysm. No visible vascular findings to explain the above.  OSSEOUS: No acute abnormalities.  Critical Value/emergent results were called by telephone at the time of interpretation on 07/01/2014 at 1:08 pm to Dr. Danford Bad , who verbally acknowledged these results.  IMPRESSION: 1. Perforated bowel with large pneumoperitoneum. Ileal pneumatosis suggests small bowel ischemia/necrosis. In this patient with recent embolic stroke, suspect mesenteric embolus. 2. Moderate ascites with feculent material or developing abscess in the rectovaginal recess. 3. Left lower lobe collapse. 4. 29 mm infrarenal aortic aneurysm.   Electronically Signed   By: Marnee Spring M.D.   On: 07/01/2014 13:17   Mr Maxine Glenn Head Wo Contrast  06/20/2014   CLINICAL DATA:  Shortness of breath and generalized weakness for 2 weeks. LEFT hemiparesis and LEFT facial droop of recent onset, but unspecified duration. History of chronic renal disease and hypertension. Acute respiratory failure. Abnormal CT head.  EXAM: MRI HEAD WITHOUT CONTRAST  MRA HEAD WITHOUT CONTRAST  TECHNIQUE: Multiplanar, multiecho pulse sequences of the brain and surrounding structures were obtained without intravenous contrast. Angiographic images of the head were obtained using MRA technique without contrast.  COMPARISON:  CT head performed  06/19/2014.  FINDINGS: MRI HEAD FINDINGS  Only a limited number of pulse sequences could be obtained on the MRI exam. Diffusion axial and T1 sagittal images are interpreted.  Multiple areas of restricted diffusion throughout both hemispheres are identified consistent with acute infarction. The largest confluent area involves the RIGHT medial parasagittal frontal and posterior frontal cortex and subcortical white matter extending as far posteriorly as the central sulcus. Other smaller areas of restricted diffusion representing acute infarction can be seen in the LEFT anterior frontal cortex, LEFT parietal parasagittal cortex, and the LEFT parietal subcortical white matter.  No gross evidence for hemorrhage, mass lesion, hydrocephalus, or extra-axial fluid based on axial DWI. No pituitary enlargement or tonsillar herniation. Upper cervical region unremarkable. No osseous lesions.  MRA HEAD FINDINGS  Dolichoectatic but widely patent internal carotid arteries. Dolichoectatic and widely patent basilar artery. Both vertebrals contribute to basilar  formation with the RIGHT dominant. There is no large vessel occlusion, proximal intracranial stenosis, or visible berry aneurysm. Moderately diseased anterior inferior cerebellar arteries bilaterally. No RIGHT PICA is visualized.  The distal RIGHT anterior cerebral artery in its pericallosal segment appears moderately diseased corresponding to the observed RIGHT ACA territory infarct.  IMPRESSION: Multifocal areas of acute infarction throughout both hemispheres, with the largest confluent area involving the RIGHT medial parasagittal frontal and posterior frontal cortex, distal ACA territory.  A full MRI examination could not be completed, therefore comprehensive assessment of intracranial abnormalities, including characterization of the observed acute infarcts, is not possible.  No proximal large vessel occlusion on MRA intracranial exam.   Electronically Signed   By: Davonna Belling M.D.   On: 06/20/2014 17:11   Mr Brain Wo Contrast  06/20/2014   CLINICAL DATA:  Shortness of breath and generalized weakness for 2 weeks. LEFT hemiparesis and LEFT facial droop of recent onset, but unspecified duration. History of chronic renal disease and hypertension. Acute respiratory failure. Abnormal CT head.  EXAM: MRI HEAD WITHOUT CONTRAST  MRA HEAD WITHOUT CONTRAST  TECHNIQUE: Multiplanar, multiecho pulse sequences of the brain and surrounding structures were obtained without intravenous contrast. Angiographic images of the head were obtained using MRA technique without contrast.  COMPARISON:  CT head performed 06/19/2014.  FINDINGS: MRI HEAD FINDINGS  Only a limited number of pulse sequences could be obtained on the MRI exam. Diffusion axial and T1 sagittal images are interpreted.  Multiple areas of restricted diffusion throughout both hemispheres are identified consistent with acute infarction. The largest confluent area involves the RIGHT medial parasagittal frontal and posterior frontal cortex and subcortical white matter extending as far posteriorly as the central sulcus. Other smaller areas of restricted diffusion representing acute infarction can be seen in the LEFT anterior frontal cortex, LEFT parietal parasagittal cortex, and the LEFT parietal subcortical white matter.  No gross evidence for hemorrhage, mass lesion, hydrocephalus, or extra-axial fluid based on axial DWI. No pituitary enlargement or tonsillar herniation. Upper cervical region unremarkable. No osseous lesions.  MRA HEAD FINDINGS  Dolichoectatic but widely patent internal carotid arteries. Dolichoectatic and widely patent basilar artery. Both vertebrals contribute to basilar formation with the RIGHT dominant. There is no large vessel occlusion, proximal intracranial stenosis, or visible berry aneurysm. Moderately diseased anterior inferior cerebellar arteries bilaterally. No RIGHT PICA is visualized.  The distal RIGHT  anterior cerebral artery in its pericallosal segment appears moderately diseased corresponding to the observed RIGHT ACA territory infarct.  IMPRESSION: Multifocal areas of acute infarction throughout both hemispheres, with the largest confluent area involving the RIGHT medial parasagittal frontal and posterior frontal cortex, distal ACA territory.  A full MRI examination could not be completed, therefore comprehensive assessment of intracranial abnormalities, including characterization of the observed acute infarcts, is not possible.  No proximal large vessel occlusion on MRA intracranial exam.   Electronically Signed   By: Davonna Belling M.D.   On: 06/20/2014 17:11   Ct Abdomen Pelvis W Contrast  07/09/2014   CLINICAL DATA:  Ongoing sepsis. Evaluate for abscess following recent abdominal surgery for small bowel perforation. End-stage renal disease.  EXAM: CT ABDOMEN AND PELVIS WITH CONTRAST  TECHNIQUE: Multidetector CT imaging of the abdomen and pelvis was performed using the standard protocol following bolus administration of intravenous contrast.  CONTRAST:  80mL OMNIPAQUE IOHEXOL 300 MG/ML  SOLN  COMPARISON:  Prior CTs 07/01/2014 and 07/06/2014.  FINDINGS: Lower chest: Left pleural effusion and adjacent left lower lobe atelectasis appear  mildly improved. There is no pericardial effusion. The heart is moderately enlarged.  Hepatobiliary: Contrast bolus is limited. No focal hepatic abnormalities identified. No evidence of gallstones, gallbladder wall thickening or biliary dilatation.  Pancreas: Stable appearance. A small fatty cleft within the pancreatic head is unchanged. No focal surrounding inflammatory change.  Spleen: Normal in size without focal abnormality.  Adrenals/Urinary Tract: Both adrenal glands appear normal.The kidneys appear stable. There is a stable cyst involving the interpolar region of the left kidney. There is limited contrast excretion from both kidneys. Perinephric soft tissue stranding is  stable. There is no evidence of urinary tract calculus. The bladder appears unremarkable.  Stomach/Bowel: Nasogastric tube extends into the mid stomach. The stomach and small bowel are decompressed status post partial small bowel resection. There is contrast material within the colon. The sigmoid colon demonstrates diverticulosis and wall thickening. No extravasated enteric contrast or evidence of bowel obstruction.There is a surgical drain within the upper abdomen. Ascites is again noted with multiple interloop components. In the pelvis, there is a better defined collection in the cul-de-sac which has slightly enlarged. This measures 6.2 x 9.0 cm transverse and is associated with mild enhancement of its margins. This may reflect a developing abscess. No other focal fluid collections are identified. There is a small amount of air within the peritoneal cavity attributed to the recent surgery and/or drain.  Vascular/Lymphatic: There are no enlarged abdominal or pelvic lymph nodes. Diffuse atherosclerosis is again noted with dilatation of the abdominal aorta to 3.0 cm. No large vessel occlusion identified.  Reproductive: Multiple partially calcified uterine fibroids are noted. No evidence of adnexal mass.  Other: Open incision within the anterior abdominal wall again noted.  Musculoskeletal: No acute or significant osseous findings.  IMPRESSION: 1. Enlarging fluid collection within the pelvic cul-de-sac is associated with enhancement of its peritoneal margins and may reflect a developing abscess. This may be amenable to trans gluteal drainage. 2. The additional components of ascites throughout the peritoneal cavity are stable without other focal collection. 3. No evidence of bowel obstruction or extravasated enteric contrast status post partial small bowel resection.   Electronically Signed   By: Carey Bullocks M.D.   On: 07/09/2014 21:10   US Renal  06/18/2014   CLINICAL DATA:  Patient with acute renal failure.   EXAM: RENAL/URINARY TRACT ULTRASOUND COMPLETE  COMPARISON:  None.  FINDINGS: Right Kidney:  Length: 8.4 cm. No hydronephrosis. Renal cortex is diffusely increased in echogenicity.  Left Kidney:  Length: 9.1 cm. There is a 1.9 x 1.7 x 1.5 cm hypoechoic mass off of the interpolar region of the left kidney.  Bladder:  Poorly distended.  Ascites within the pelvis.  IMPRESSION: Indeterminate 1.9 cm hypoechoic mass off the interpolar region of the left kidney. Evaluation is markedly limited due to body habitus. This may potentially represent a cyst however solid mass is not excluded. Recommend follow-up in the outpatient setting with either pre and post enhanced CT or MRI.  Echogenic right kidney most compatible with chronic medical renal disease.  No hydronephrosis.  Fluid within the pelvis.  These results will be called to the ordering clinician or representative by the Radiologist Assistant, and communication documented in the PACS or zVision Dashboard.   Electronically Signed   By: Annia Belt M.D.   On: 06/18/2014 13:20   Dg Chest Port 1 View  07/09/2014   CLINICAL DATA:  End-stage renal disease, pulmonary edema, acute respiratory failure.  EXAM: PORTABLE CHEST - 1 VIEW  COMPARISON:  Portable chest x-ray of July 07, 2014  FINDINGS: The patient is somewhat rotated toward the left. The right lung is well-expanded. There is persistent increased density in the left lower lobe with better visualization of the hemidiaphragm however. The cardiac silhouette remains enlarged. The central pulmonary vascularity is engorged. A small left pleural effusion is suspected.  The endotracheal tube tip lies approximately 4.9 cm above the carina. The dialysis type catheter tip projects over the midportion of the SVC. The left internal jugular venous catheter tip projects at the junction of the left internal jugular vein with the left subclavian vein. The esophagogastric tube tip projects below the inferior margin of the image.   IMPRESSION: There has been slight interval improvement in the appearance of the left lower lobe. There is persistent atelectasis or pneumonia with small left pleural effusion. There is also mild overall improvement in the pulmonary interstitial edema.   Electronically Signed   By: David  [place]   On: 07/09/2014 07:23   Dg Chest Port 1 View  07/07/2014   CLINICAL DATA:  Hypoxia.  EXAM: PORTABLE CHEST - 1 VIEW  COMPARISON:  07/06/2014 and 07/04/2014.  FINDINGS: 0852 hours. The nasogastric tube, right IJ dialysis catheter and left IJ central venous catheter appear unchanged. Stable left hemidiaphragm elevation and left basilar airspace disease. Vascular congestion has increased and there may be mild edema. No pneumothorax or significant pleural effusion identified. The heart size and mediastinal contours are stable.  IMPRESSION: Increased vascular congestion with possible mild pulmonary edema. Otherwise stable examination.   Electronically Signed   By: Carey Bullocks M.D.   On: 07/07/2014 09:06   Dg Chest Port 1 View  07/06/2014   CLINICAL DATA:  Respiratory failure.  EXAM: PORTABLE CHEST - 1 VIEW  COMPARISON:  07/04/2014.  FINDINGS: Endotracheal tube, right dialysis catheter, left IJ line, NG tube are in stable position. Mediastinum and hilar structures are stable. Stable cardiomegaly. Persistent left lower lobe atelectasis and/or infiltrate. Mild infiltrate right lung base cannot be excluded. Left pleural effusion cannot be excluded. No pneumothorax.  IMPRESSION: 1. Lines and tubes in stable position. 2. Persistent left lower lobe atelectasis and and infiltrate. Left pleural effusion cannot be excluded. 3. Mild new infiltrate right lung base cannot be excluded.   Electronically Signed   By: Maisie Fus  Register   On: 07/06/2014 07:11   Dg Chest Port 1 View  07/04/2014   CLINICAL DATA:  Respiratory failure  EXAM: PORTABLE CHEST - 1 VIEW  COMPARISON:  07/03/2014  FINDINGS: Cardiac shadow is stable. A left jugular  central line, endotracheal tube, nasogastric catheter and right-sided dialysis catheter are again seen and stable. Improved aeration is noted in the left lung base. No new focal abnormality is noted.  IMPRESSION: Improving infiltrate in the left base. No new focal abnormality is seen.   Electronically Signed   By: Alcide Clever M.D.   On: 07/04/2014 08:10   Dg Chest Port 1 View  07/03/2014   CLINICAL DATA:  Intubation .  EXAM: PORTABLE CHEST - 1 VIEW  COMPARISON:  07/02/2014.  FINDINGS: Endotracheal tube and NG tube in stable position. Left IJ line and right IJ dialysis catheter stable position. Patient rotated to the left. Heart size stable. Pulmonary vascularity normal . Persistent left lower lobe atelectasis and infiltrate. Small left pleural effusion cannot be excluded. No pneumothorax. No acute osseous abnormality .  IMPRESSION: 1. Lines and tubes in stable position. 2. Persistent dense left lower lobe atelectasis and infiltrate. No  interim change. Small left pleural effusion cannot be excluded .   Electronically Signed   By: Maisie Fushomas  Register   On: 07/03/2014 07:35   Dg Chest Portable 1 View  07/02/2014   CLINICAL DATA:  Atelectasis.  EXAM: PORTABLE CHEST - 1 VIEW  COMPARISON:  None.  FINDINGS: Interim intubation with endotracheal tube tip 5.2 cm above the carina. Interim placement of NG tube, its tip is projected below left hemidiaphragm. Right dialysis catheter, left IJ catheter in stable position.Stable cardiomegaly. No pulmonary venous congestion. Persistent left lower lobe atelectasis and infiltrate with possible small left pleural effusion. No pneumothorax.  IMPRESSION: 1. Interim intubation. Its tip is 5.2 cm above the carina. Interim NG tube placement. Its tip is below the left hemidiaphragm. 2. Persistent left lower lobe atelectasis and infiltrate. Small left pleural effusion cannot be excluded. 3. Stable cardiomegaly.   Electronically Signed   By: Maisie Fushomas  Register   On: 07/02/2014 07:14   Dg  Chest Port 1 View  07/01/2014   CLINICAL DATA:  Status post central line placement;History of asthma, chronic renal insufficiency, and gout  EXAM: PORTABLE CHEST - 1 VIEW  COMPARISON:  Portable chest x-ray of July 01, 2014 at 7:29 a.m.  FINDINGS: The right lung is adequately inflated and clear. On the left there is progressive volume loss with shift of the mediastinum toward the left. There is dense infiltrate and/or pleural effusion in the left lower hemi thorax. The left heart border is obscured. The central pulmonary vascularity is prominent. The dialysis catheter on the right is unchanged in position with the tip in the proximal SVC. A newly placed left internal jugular venous catheter tip projects at the junction of the right and left brachiocephalic veins.  IMPRESSION: There is no postprocedure complication following placement of a left internal jugular venous catheter the dialysis catheter is unchanged. There is persistent left lower lobe atelectasis and probable pleural effusion.   Electronically Signed   By: David  Swazil[place]   On: 07/01/2014 15:27   Dg Chest Port 1 View  07/01/2014   CLINICAL DATA:  Dyspnea.  EXAM: PORTABLE CHEST - 1 VIEW  COMPARISON:  06/29/2014  FINDINGS: Right jugular dual-lumen catheter in the SVC unchanged. No pneumothorax.  Left lower lobe consolidation with volume loss unchanged. This is most consistent with collapse in the left lower lobe. Possible small left effusion. Right lower lobe atelectasis unchanged. Negative for edema.  IMPRESSION: Left lower lobe collapse remains unchanged. Mild right lower lobe atelectasis. Negative for edema.   Electronically Signed   By: Marlan Palauharles  Clark M.D.   On: 07/01/2014 07:48   Dg Chest Port 1 View  06/29/2014   CLINICAL DATA:  Acute onset of shortness of breath. Initial encounter.  EXAM: PORTABLE CHEST - 1 VIEW  COMPARISON:  Chest radiograph performed earlier today at 6:47 a.m.  FINDINGS: The lungs are relatively well expanded. Persistent  retrocardiac airspace opacification may reflect asymmetric pulmonary edema or pneumonia. Underlying vascular congestion is again noted. A small left pleural effusion is suspected. No pneumothorax is seen.  The cardiomediastinal silhouette is borderline enlarged. A right-sided dual-lumen catheter is seen ending overlying the mid SVC. No acute osseous abnormalities are seen.  IMPRESSION: Persistent retrocardiac airspace opacification may reflect asymmetric pulmonary edema or pneumonia. Underlying vascular congestion and borderline cardiomegaly again noted. Small left pleural effusion suspected.   Electronically Signed   By: Roanna RaiderJeffery  Chang M.D.   On: 06/29/2014 23:03   Dg Chest Port 1 View  06/29/2014  CLINICAL DATA:  Tachypnea  EXAM: PORTABLE CHEST - 1 VIEW  COMPARISON:  Two days ago  FINDINGS: Right IJ central line is in similar position, with the split tips at the SVC level.  Unchanged cardiopericardial enlargement.  Persistent retrocardiac opacity favors atelectasis given streaky appearance and volume loss. No air leak or significant effusion.  IMPRESSION: Stable retrocardiac opacity, favor atelectasis.   Electronically Signed   By: Marnee Spring M.D.   On: 06/29/2014 07:09   Dg Chest Port 1 View  06/27/2014   CLINICAL DATA:  Status post dialysis catheter insertion. Initial encounter.  EXAM: PORTABLE CHEST - 1 VIEW  COMPARISON:  Chest radiograph performed 06/20/2014  FINDINGS: The patient's right-sided dual-lumen catheter is seen ending overlying the mid SVC.  Left basilar airspace opacity likely reflects atelectasis. A small left pleural effusion is suspected. No pneumothorax is seen.  The cardiomediastinal silhouette is mildly enlarged. No acute osseous abnormalities are identified.  IMPRESSION: 1. Right-sided dual-lumen catheter is seen ending overlying the mid SVC. 2. Left basilar airspace opacity likely reflects atelectasis. Suspect small left pleural effusion. 3. Mild cardiomegaly.   Electronically  Signed   By: Roanna Raider M.D.   On: 06/27/2014 09:49   Dg Chest Port 1 View  06/20/2014   CLINICAL DATA:  Hypoxia  EXAM: PORTABLE CHEST - 1 VIEW  COMPARISON:  06/17/2014  FINDINGS: Stable mild cardiomegaly. No mediastinal or hilar masses or evidence of adenopathy.  Clear lungs.  No pleural effusion or pneumothorax.  IMPRESSION: No acute cardiopulmonary disease. Stable appearance from the prior exam.   Electronically Signed   By: Amie Portland M.D.   On: 06/20/2014 09:08   Dg Abd 2 Views  06/28/2014   CLINICAL DATA:  Abdominal pain  EXAM: ABDOMEN - 2 VIEW  COMPARISON:  No similar prior exam is available at this institution for comparison or on [facility].  FINDINGS: The bowel gas pattern is normal. There is no evidence of free air. No radio-opaque calculi or other significant radiographic abnormality is seen. Gaseous gastric distention is noted. Coarse calcification over the pelvis likely indicates a fibroid.  IMPRESSION: Negative.   Electronically Signed   By: Christiana Pellant M.D.   On: 06/28/2014 13:37   Dg Fluoro Guide Cv Line-no Report  06/27/2014   CLINICAL DATA:    FLOURO GUIDE CV LINE  Fluoroscopy was utilized by the requesting physician.  No radiographic  interpretation.    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  EXAM: DIALYSIS AV FISTULOGRAM  COMPARISON:  None.  CONTRAST:  50mL OMNIPAQUE IOHEXOL 300 MG/ML  SOLN  FLUOROSCOPY TIME:  24 seconds.  PROCEDURE: The procedure, risks, benefits, and alternatives were explained to the patient. Questions regarding the procedure were encouraged and answered. The patient understands and consents to the procedure.  The left arm native fistula was prepped with Betadine in a sterile fashion, and a sterile drape was applied covering the operative field. A diagnostic fistulogram was performed via an 18 gauge angiocatheter introduced into venous outflow.  Venous drainage was assessed to the level of the central veins in the chest. Proximal fistula was studied by reflux maneuver with temporary compression of venous outflow. After the procedure, the angiocatheter was removed and hemostasis obtained with manual compression.  COMPLICATIONS: None  FINDINGS: Antecubital anastomosis between the brachial artery and cephalic vein is normally patent. After a short segment, there is focal aneurysmal  dilatation of the vein in the antecubital fossa. There is competing venous outflow noted in the cephalic vein as well as the deep venous system. The cephalic vein is tortuous but shows no significant focal stenoses. Central veins are normally patent, including the SVC.  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 There is competing venous outflow in the deep venous system of the upper arm.  ACCESS: No percutaneous intervention was indicated today.   Electronically Signed   By: Irish Lack M.D.   On: 06/22/2014 13:20    Labs:  CBC:  Recent Labs  07/08/14 0434 07/08/14 2238 07/09/14 0430 07/10/14 0415  WBC 20.5* 23.3* 25.6* 26.4*  HGB 7.5* 7.0* 6.7* 7.3*  HCT 22.9* 21.7* 21.2* 22.6*  PLT 42* 35* 31* 40*    COAGS:  Recent Labs  07/01/14 1445 07/01/14 1813 07/02/14 0430 07/03/14 0445 07/04/14 0510 07/07/14 0400 07/10/14 0715  INR 1.44 1.83* 1.51*  --   --   --  1.21  APTT  --   --  35 38* 36 41* 37    BMP:  Recent Labs  07/09/14 0430 07/09/14 0500 07/09/14 1730 07/10/14 0415  NA 130* 128* 128* 129*  K 3.9 3.9 3.9 4.0  CL 100 98 97 97  CO2 GLUCOSE 201* 198* 188* 185*  BUN 37* 38* 34* 38*  CALCIUM 7.1* 6.7* 7.3* 7.6*  CREATININE 2.11* 2.08* 1.93* 1.99*  GFRNONAA 25* 25* 27* 26*  GFRAA 29* 29* 32* 31*    LIVER FUNCTION TESTS:  Recent Labs  07/02/14 0430  07/03/14 0445  07/06/14 0552  07/09/14 0430 07/09/14 0500  07/09/14 1730 07/10/14 0415  BILITOT 2.8*  --  1.8*  --  1.9*  --  2.0*  --   --   --   AST 757*  --  231*  --  25  --  17  --   --   --   ALT 455*  --  302*  --  69*  --  29  --   --   --   ALKPHOS 65  --  78  --  75  --  111  --   --   --   PROT 5.2*  --  5.3*  --  4.5*  --  4.9*  --   --   --   ALBUMIN 2.4*  < > 2.0*  < > 1.4*  < > 1.2* 1.2* 1.3* 1.3*  < > = values in this interval not displayed.  TUMOR MARKERS: No results for input(s): AFPTM, CEA, CA199, CHROMGRNA in the last 8760 hours.  Assessment and Plan:  Intra abdominal abscess post op Scheduled now for abscess drain placement Risks and Benefits discussed with the patient's daughter including bleeding, infection, damage to adjacent structures, bowel perforation/fistula connection, and sepsis. All of the patient's questions were answered, patient's daughter is agreeable to proceed. Consent signed and in chart.  Plts ordered: 1 unit now and 1 on call to Xray  Thank you for this interesting consult.  I greatly enjoyed meeting [name] and look forward to participating in their care.  Signed: Saphyre Cillo A 07/10/2014, 12:05 PM   I spent a total of 40 Minutes  in face to face in clinical consultation, greater than 50% of which was counseling/coordinating care for abd abscess drain

## 2014-07-10 NOTE — Progress Notes (Signed)
[facility] Progress Note   Subjective: no SVT, for perc drain today  Filed Vitals:   07/10/14 0700 07/10/14 0730 07/10/14 0800 07/10/14 0830  BP: 100/79 105/73 115/74 110/77  Pulse:      Temp:  97.2 F (36.2 C)    TempSrc:  Oral    Resp: Height:      Weight:      SpO2: 95% 100%  88%   Exam: Lethargic, arouses easily, no distress, NG in place No jvd Chest clear bilat RRR no MRG Abd distended, large dressing in place, drain R abd, dec'd BS 1+ pitting dependent edema LE's/hips bilat No UE edema  HD: new start Max inpt weight 106kg, minimum 94.1kg        Assessment: 1. Gastric ulcer w perforation , s/p repair 3/30 - on vanc/ zosyn, neo gtt 2. S/P acute ACA stroke / left hemiparesis 3. ESRD new start to HD; cont CRRT 4. 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  No binders d/t low phos 6. Vol excess- better. 3rd spaced. Keeping even for now w CRRT 7. COPD/ tobacco 8. S/P influenza H1N1 9. 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 HD access - we are using Permcath placed 3/26) d/t AVF problems; when stable [facility] wants to considering revision of AVF  Plan - cont CRRT, keep even    [name] MD  pager [phone]    cell [phone]  07/10/2014, 10:10 AM     Recent Labs Lab 07/09/14 0500 07/09/14 1730 07/10/14 0415  NA 128* 128* 129*  K 3.9 3.9 4.0  CL 98 97 97  CO2 GLUCOSE 198* 188* 185*  BUN 38* 34* 38*  CREATININE 2.08* 1.93* 1.99*  CALCIUM 6.7* 7.3* 7.6*  PHOS 1.5* 2.2* 2.2*    Recent Labs Lab 07/06/14 0552  07/09/14 0430 07/09/14 0500 07/09/14 1730 07/10/14 0415  AST 25  --  17  --   --   --   ALT 69*  --  29  --   --   --   ALKPHOS 75  --  111  --   --   --   BILITOT 1.9*  --  2.0*  --   --   --   PROT 4.5*  --  4.9*  --   --   --   ALBUMIN 1.4*  < > 1.2* 1.2* 1.3* 1.3*  < > = values in this interval not displayed.  Recent Labs Lab 07/08/14 0434 07/08/14 2238 07/09/14 0430 07/10/14 0415  WBC  20.5* 23.3* 25.6* 26.4*  NEUTROABS 18.6*  --  23.3* 24.3*  HGB 7.5* 7.0* 6.7* 7.3*  HCT 22.9* 21.7* 21.2* 22.6*  MCV 80.4 80.4 81.2 81.0  PLT 42* 35* 31* 40*   . sodium chloride   Intravenous Once  . chlorhexidine  15 mL Mouth Rinse BID  . [START ON 07/11/2014] darbepoetin (ARANESP) injection - DIALYSIS  150 mcg Intravenous Q Sat-HD  . ferric gluconate (FERRLECIT/NULECIT) IV  125 mg Intravenous Q M,W,F-HD  . fluconazole (DIFLUCAN) IV  400 mg Intravenous Q24H  . hydrocortisone sodium succinate  50 mg Intravenous Q6H  . insulin aspart  0-15 Units Subcutaneous 6 times per day  . ipratropium  0.5 mg Nebulization Q6H  . levalbuterol  0.63 mg Nebulization Q6H  . pantoprazole (PROTONIX) IV  40 mg Intravenous Q12H  . piperacillin-tazobactam (ZOSYN)  IV  2.25 g Intravenous 4 times per  day  . sodium phosphate  Dextrose 5% IVPB  20 mmol Intravenous Once  . vancomycin  1,000 mg Intravenous Q24H   . sodium chloride 10 mL/hr at 07/10/14 0700  . TPN (CLINIMIX) Adult without lytes 110 mL/hr at 07/09/14 2000   And  . fat emulsion 240 mL (07/09/14 2000)  . TPN (CLINIMIX) Adult without lytes     And  . fat emulsion    . norepinephrine (LEVOPHED) Adult infusion Stopped (07/08/14 2250)  . phenylephrine (NEO-SYNEPHRINE) Adult infusion 45 mcg/min (07/10/14 0800)  . dialysis replacement fluid (prismasate) 400 mL/hr at 07/10/14 0418  . dialysis replacement fluid (prismasate) 200 mL/hr at 07/10/14 0543  . dialysate (PRISMASATE) 1,500 mL/hr at 07/10/14 0730   albuterol, anticoagulant sodium citrate, fentaNYL, metoprolol, morphine injection, ondansetron (ZOFRAN) IV, sodium chloride

## 2014-07-10 NOTE — Progress Notes (Signed)
Name: [name] MRN: [id number] DOB: 04-03-1956    ADMISSION DATE:  06/17/2014 CONSULTATION DATE:  3/30  REFERRING MD :  Mahala Menghini   CHIEF COMPLAINT:  Hypoxia   BRIEF PATIENT DESCRIPTION: 59 yo female smoker with hx HTN, ESRD on HD (just began this admit), COPD, failed L AV fistula, initially admitted 3/16 with flu, acute hypoxic resp failure.  Ultimately tx to [facility] for worsening metabolic parameters and need for HD.  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  Has made little improvement and on 3/30 has worsening hypoxia, generalized pain, hyperkalemia, worsening leukocytosis and PCCM consulted.     SIGNIFICANT EVENTS  3/16 influenza A+ 3/18 R ACA stroke 3/31 to or ex lap for per pyloric ulcer and sm bowel resection 4/1- septic shock on pressors 4/3-pressors weaned off  4/4- restart pressors, CRRT 4/5- run of SVTs 4/6- off levo switched to neo 4/7- pelvic abscess on CT  STUDIES:  3/19 MRI: multifocal acute infarction of both hemispheres, large right distal ACA 3/22 TEE: severe RAE, severely reduced RV function, severe TR, no LAA thrombus  3/30 CT chest/abd: perf bowel 2/2 presumed embolism in setting of ACA, ?rectrovaginal abscess development, LLL collapse 4/4 CT abd/pelvis: small pleural effusion left, no leaking contrast or SBO findings  4/7 CT abd/pelvis: increased pelvic abscess growth   SUBJECTIVE:  No acute events overnight. Pt feels slightly better but no improvement. Asking to eat. Denies abdominal or chest pain.   VITAL SIGNS: Temp:  [97.2 F (36.2 C)-98.3 F (36.8 C)] 97.2 F (36.2 C) (04/08 0730) Pulse Rate:  [91-97] 97 (04/07 1636) Resp:  [16-27] 18 (04/08 0800) BP: (81-126)/(44-91) 115/74 mmHg (04/08 0800) SpO2:  [90 %-100 %] 100 % (04/08 0730) Weight:  [215 lb 2.7 oz (97.6 kg)] 215 lb 2.7 oz (97.6 kg) (04/08 0500)  PHYSICAL EXAMINATION: General:  Chronically ill appearing female, awake and interactive Neuro: follows commands,  left arm and leg paralysis HEENT:  MMM, no scleral icterus or conjunctival injection Cardiovascular: RRR, no rubs murmurs or gallops Lungs:  Slight expiratory wheezes throughout, decreased BS on right side compared to left, coarse BS in left lung base Abdomen:  Round, mildly distended, tender, hypoactive bs , dressing intact c/d/i  Musculoskeletal:  Warm and dry, 1+ pitting edema  CBC Recent Labs     07/08/14  2238  07/09/14  0430  07/10/14  0415  WBC  23.3*  25.6*  26.4*  HGB  7.0*  6.7*  7.3*  HCT  21.7*  21.2*  22.6*  PLT  35*  31*  40*    Coag's No results for input(s): APTT, INR in the last 72 hours.  BMET Recent Labs     07/09/14  0500  07/09/14  1730  07/10/14  0415  NA  128*  128*  129*  K  3.9  3.9  4.0  CL  98  97  97  CO2  21  21  23   BUN  38*  34*  38*  CREATININE  2.08*  1.93*  1.99*  GLUCOSE  198*  188*  185*    Electrolytes Recent Labs     07/08/14  0434   07/09/14  0430  07/09/14  0500  07/09/14  1730  07/10/14  0415  CALCIUM   --    < >  7.1*  6.7*  7.3*  7.6*  MG  2.0   --   2.0   --    --  2.2  PHOS   --    < >  1.5*  1.5*  2.2*  2.2*   < > = values in this interval not displayed.    Liver Enzymes Recent Labs     07/09/14  0430  07/09/14  0500  07/09/14  1730  07/10/14  0415  AST  17   --    --    --   ALT  29   --    --    --   ALKPHOS  111   --    --    --   BILITOT  2.0*   --    --    --   ALBUMIN  1.2*  1.2*  1.3*  1.3*   Glucose Recent Labs     07/09/14  1148  07/09/14  1638  07/09/14  2111  07/10/14  0003  07/10/14  0413  07/10/14  0722  GLUCAP  185*  120*  132*  136*  174*  207*    Imaging Ct Abdomen Pelvis W Contrast  07/09/2014   CLINICAL DATA:  Ongoing sepsis. Evaluate for abscess following recent abdominal surgery for small bowel perforation. End-stage renal disease.  EXAM: CT ABDOMEN AND PELVIS WITH CONTRAST  TECHNIQUE: Multidetector CT imaging of the abdomen and pelvis was performed using the standard  protocol following bolus administration of intravenous contrast.  CONTRAST:  80mL OMNIPAQUE IOHEXOL 300 MG/ML  SOLN  COMPARISON:  Prior CTs 07/01/2014 and 07/06/2014.  FINDINGS: Lower chest: Left pleural effusion and adjacent left lower lobe atelectasis appear mildly improved. There is no pericardial effusion. The heart is moderately enlarged.  Hepatobiliary: Contrast bolus is limited. No focal hepatic abnormalities identified. No evidence of gallstones, gallbladder wall thickening or biliary dilatation.  Pancreas: Stable appearance. A small fatty cleft within the pancreatic head is unchanged. No focal surrounding inflammatory change.  Spleen: Normal in size without focal abnormality.  Adrenals/Urinary Tract: Both adrenal glands appear normal.The kidneys appear stable. There is a stable cyst involving the interpolar region of the left kidney. There is limited contrast excretion from both kidneys. Perinephric soft tissue stranding is stable. There is no evidence of urinary tract calculus. The bladder appears unremarkable.  Stomach/Bowel: Nasogastric tube extends into the mid stomach. The stomach and small bowel are decompressed status post partial small bowel resection. There is contrast material within the colon. The sigmoid colon demonstrates diverticulosis and wall thickening. No extravasated enteric contrast or evidence of bowel obstruction.There is a surgical drain within the upper abdomen. Ascites is again noted with multiple interloop components. In the pelvis, there is a better defined collection in the cul-de-sac which has slightly enlarged. This measures 6.2 x 9.0 cm transverse and is associated with mild enhancement of its margins. This may reflect a developing abscess. No other focal fluid collections are identified. There is a small amount of air within the peritoneal cavity attributed to the recent surgery and/or drain.  Vascular/Lymphatic: There are no enlarged abdominal or pelvic lymph nodes. Diffuse  atherosclerosis is again noted with dilatation of the abdominal aorta to 3.0 cm. No large vessel occlusion identified.  Reproductive: Multiple partially calcified uterine fibroids are noted. No evidence of adnexal mass.  Other: Open incision within the anterior abdominal wall again noted.  Musculoskeletal: No acute or significant osseous findings.  IMPRESSION: 1. Enlarging fluid collection within the pelvic cul-de-sac is associated with enhancement of its peritoneal margins and may reflect a developing abscess. This may be amenable to trans  gluteal drainage. 2. The additional components of ascites throughout the peritoneal cavity are stable without other focal collection. 3. No evidence of bowel obstruction or extravasated enteric contrast status post partial small bowel resection.   Electronically Signed   By: Carey BullocksWilliam  Veazey M.D.   On: 07/09/2014 21:10   Dg Chest Port 1 View  07/09/2014   CLINICAL DATA:  End-stage renal disease, pulmonary edema, acute respiratory failure.  EXAM: PORTABLE CHEST - 1 VIEW  COMPARISON:  Portable chest x-ray of July 07, 2014  FINDINGS: The patient is somewhat rotated toward the left. The right lung is well-expanded. There is persistent increased density in the left lower lobe with better visualization of the hemidiaphragm however. The cardiac silhouette remains enlarged. The central pulmonary vascularity is engorged. A small left pleural effusion is suspected.  The endotracheal tube tip lies approximately 4.9 cm above the carina. The dialysis type catheter tip projects over the midportion of the SVC. The left internal jugular venous catheter tip projects at the junction of the left internal jugular vein with the left subclavian vein. The esophagogastric tube tip projects below the inferior margin of the image.  IMPRESSION: There has been slight interval improvement in the appearance of the left lower lobe. There is persistent atelectasis or pneumonia with small left pleural effusion.  There is also mild overall improvement in the pulmonary interstitial edema.   Electronically Signed   By: David  SwazilandJordan   On: 07/09/2014 07:23     ASSESSMENT / PLAN: ETT 3/31 >> 4/02  PULMONARY A: Acute hypoxic respiratory failure 2nd to H1N1, hypervolemia, HCAP. Hx of COPD. Atelectasis. Night time hypoxia concern for OHS or OSA will add prn CPAP qhs P: Oxygen to keep SpO2 > 92% Bronchial hygiene Negative fluid balance as tolerated Scheduled BD's CPAP qhs   CARDIAC A: Chronic dCHF - EF 60-65%, grade 1 diastolic dysfunction. SVT runs 4/04>>transitioned to neo (pt good EF 55-60%) Hypotensive. P: Pt transitioned to neo since had continued SVTs to keep MAP > 65 Pt on solucortef on 4/7 for ongoing hypotension will consider weaning off once clinically improves   GASTROENTEROLOGY A: Pyloric ulcer perforation s/p laparotomy P: Post op care per [facility] TPN per [facility] Continue protonix Prolonged ileus with no flatus or BM, Will drain abscess   RENAL: A: ESRD P: CVVH beginning to pull fluid  HEMATOLOGY A: Anemia of critical illness and chronic disease. Thrombocytopenia. Pltlt 42. HIT only slightly + but serotonin assay negative, therefore will hold off argatroban at this time could be component of Abx toxicity or ongoing sepsis  P: F/u CBC SCD's Hgb>7 goal   INFECTION A: Peritonitis 2nd to perforated pyloric ulcer >> f/u CT 4/08 concern for abscess. HCAP with CXR improving IR for aspiration/drain of pelvic fluid collection suspected abscess P: Day 10 of Abx, currently on zosyn, vancomycin, diflucan  3/30 anaerobic>>rare lactobacillus   ENDOCRINE A: DM type II. P: SSI  NEUROLOGY A: ACA CVA with Lt sided weakness. Deconditioning. P: Off ASA due to thrombocytopenia PT/OT when able   Summary: Continues to need pressors, pt on CRRT, CT shows intermittent growth of pelvic fluid collection concern for abscess will have IR pursue aspiration/drainage.   Christen BameNora  Sadek, MD IM PGY-3 Pgr: [phone]  Reviewed above, examined.  She remains on pressors and CRRT.  Feels weak.  Wants something to eat.  CT abd/pelvis shows increased size of fluid collection.  Heart sounds regular, lungs clear, wound dressing clean.  IR to place drain in pelvic fluid  collection.  Continue Abx.  Wean off pressors as tolerated.  CRRT per renal.  CC time by me independent of resident time is 35 minutes.  Coralyn Helling, MD [facility] Pulmonary/Critical Care 07/10/2014, 12:46 PM Pager:  [phone] After 3pm call: [phone]

## 2014-07-11 LAB — CBC WITH DIFFERENTIAL/PLATELET
Basophils Absolute: 0 10*3/uL (ref 0.0–0.1)
Basophils Relative: 0 % (ref 0–1)
EOS ABS: 0 10*3/uL (ref 0.0–0.7)
Eosinophils Relative: 0 % (ref 0–5)
HCT: 21.7 % — ABNORMAL LOW (ref 36.0–46.0)
HEMOGLOBIN: 7.1 g/dL — AB (ref 12.0–15.0)
Lymphocytes Relative: 4 % — ABNORMAL LOW (ref 12–46)
Lymphs Abs: 0.9 10*3/uL (ref 0.7–4.0)
MCH: 27.2 pg (ref 26.0–34.0)
MCHC: 32.7 g/dL (ref 30.0–36.0)
MCV: 83.1 fL (ref 78.0–100.0)
MONO ABS: 1.4 10*3/uL — AB (ref 0.1–1.0)
MONOS PCT: 6 % (ref 3–12)
NEUTROS ABS: 20.7 10*3/uL — AB (ref 1.7–7.7)
Neutrophils Relative %: 90 % — ABNORMAL HIGH (ref 43–77)
Platelets: 130 10*3/uL — ABNORMAL LOW (ref 150–400)
RBC: 2.61 MIL/uL — AB (ref 3.87–5.11)
RDW: 20.8 % — AB (ref 11.5–15.5)
WBC: 23 10*3/uL — AB (ref 4.0–10.5)

## 2014-07-11 LAB — RENAL FUNCTION PANEL
ANION GAP: 12 (ref 5–15)
Albumin: 1.6 g/dL — ABNORMAL LOW (ref 3.5–5.2)
Albumin: 1.6 g/dL — ABNORMAL LOW (ref 3.5–5.2)
Anion gap: 3 — ABNORMAL LOW (ref 5–15)
BUN: 39 mg/dL — ABNORMAL HIGH (ref 6–23)
BUN: 38 mg/dL — AB (ref 6–23)
CO2: 29 mmol/L (ref 19–32)
CO2: 23 mmol/L (ref 19–32)
Calcium: 8 mg/dL — ABNORMAL LOW (ref 8.4–10.5)
Calcium: 8.1 mg/dL — ABNORMAL LOW (ref 8.4–10.5)
Chloride: 97 mmol/L (ref 96–112)
Chloride: 94 mmol/L — ABNORMAL LOW (ref 96–112)
Creatinine, Ser: 1.74 mg/dL — ABNORMAL HIGH (ref 0.50–1.10)
Creatinine, Ser: 1.58 mg/dL — ABNORMAL HIGH (ref 0.50–1.10)
GFR calc Af Amer: 36 mL/min — ABNORMAL LOW (ref >90)
GFR calc Af Amer: 41 mL/min — ABNORMAL LOW (ref >90)
GFR calc non Af Amer: 31 mL/min — ABNORMAL LOW (ref >90)
GFR calc non Af Amer: 35 mL/min — ABNORMAL LOW (ref >90)
GLUCOSE: 193 mg/dL — AB (ref 70–99)
Glucose, Bld: 211 mg/dL — ABNORMAL HIGH (ref 70–99)
PHOSPHORUS: 3 mg/dL (ref 2.3–4.6)
PHOSPHORUS: 2.3 mg/dL (ref 2.3–4.6)
Potassium: 3.7 mmol/L (ref 3.5–5.1)
Potassium: 3.7 mmol/L (ref 3.5–5.1)
Sodium: 129 mmol/L — ABNORMAL LOW (ref 135–145)
Sodium: 129 mmol/L — ABNORMAL LOW (ref 135–145)

## 2014-07-11 LAB — GLUCOSE, CAPILLARY
GLUCOSE-CAPILLARY: 129 mg/dL — AB (ref 70–99)
Glucose-Capillary: 171 mg/dL — ABNORMAL HIGH (ref 70–99)
Glucose-Capillary: 271 mg/dL — ABNORMAL HIGH (ref 70–99)
Glucose-Capillary: 230 mg/dL — ABNORMAL HIGH (ref 70–99)
Glucose-Capillary: 116 mg/dL — ABNORMAL HIGH (ref 70–99)
Glucose-Capillary: 111 mg/dL — ABNORMAL HIGH (ref 70–99)

## 2014-07-11 LAB — PREPARE PLATELET PHERESIS
UNIT DIVISION: 0
Unit division: 0

## 2014-07-11 LAB — MAGNESIUM: Magnesium: 2.2 mg/dL (ref 1.5–2.5)

## 2014-07-11 MED ORDER — TRACE MINERALS CR-CU-F-FE-I-MN-MO-SE-ZN IV SOLN
INTRAVENOUS | Status: AC
  Administered 2014-07-11: 18:00:00 via INTRAVENOUS
  Filled 2014-07-11: qty 2640

## 2014-07-11 MED ORDER — FAT EMULSION 20 % IV EMUL
240.0000 mL | INTRAVENOUS | Status: AC
  Administered 2014-07-11: 240 mL via INTRAVENOUS
  Filled 2014-07-11: qty 250

## 2014-07-11 NOTE — Progress Notes (Signed)
10 Days Post-Op  Subjective: Pt on HD  No BM YET  Objective: Vital signs in last 24 hours: Temp:  [97.3 F (36.3 C)-97.9 F (36.6 C)] 97.4 F (36.3 C) (04/09 0800) Pulse Rate:  [84-103] 95 (04/08 1730) Resp:  [13-21] 15 (04/09 0800) BP: (76-149)/(38-117) 140/78 mmHg (04/09 0800) SpO2:  [60 %-100 %] 98 % (04/09 0810) Weight:  [95.7 kg (210 lb 15.7 oz)] 95.7 kg (210 lb 15.7 oz) (04/09 0500) Last BM Date: 06/28/14  Intake/Output from previous day: 04/08 0701 - 04/09 0700 In: 5020.3 [I.V.:658.6; Blood:620; IV Piggyback:856.7; TPN:2880] Out: 4733 [Emesis/NG output:230; Drains:30] Intake/Output this shift: Total I/O In: 142.8 [I.V.:22.8; TPN:120] Out: 150 [Other:150]  Incision/Wound:CLEAN AND OPEN NGT output less  Over 24 hours soft obese  Lab Results:   Recent Labs  07/10/14 0415 07/11/14 0557  WBC 26.4* 23.0*  HGB 7.3* 7.1*  HCT 22.6* 21.7*  PLT 40* 130*   BMET  Recent Labs  07/10/14 1700 07/11/14 0557  NA 128* 129*  K 3.9 3.7  CL 95* 97  CO2 24 29  GLUCOSE 207* 211*  BUN 43* 39*  CREATININE 2.02* 1.74*  CALCIUM 8.0* 8.0*   PT/INR  Recent Labs  07/10/14 0715  LABPROT 15.4*  INR 1.21   ABG No results for input(s): PHART, HCO3 in the last 72 hours.  Invalid input(s): PCO2, PO2  Studies/Results: Ct Abdomen Pelvis W Contrast  07/09/2014   CLINICAL DATA:  Ongoing sepsis. Evaluate for abscess following recent abdominal surgery for small bowel perforation. End-stage renal disease.  EXAM: CT ABDOMEN AND PELVIS WITH CONTRAST  TECHNIQUE: Multidetector CT imaging of the abdomen and pelvis was performed using the standard protocol following bolus administration of intravenous contrast.  CONTRAST:  80mL OMNIPAQUE IOHEXOL 300 MG/ML  SOLN  COMPARISON:  Prior CTs 07/01/2014 and 07/06/2014.  FINDINGS: Lower chest: Left pleural effusion and adjacent left lower lobe atelectasis appear mildly improved. There is no pericardial effusion. The heart is moderately enlarged.   Hepatobiliary: Contrast bolus is limited. No focal hepatic abnormalities identified. No evidence of gallstones, gallbladder wall thickening or biliary dilatation.  Pancreas: Stable appearance. A small fatty cleft within the pancreatic head is unchanged. No focal surrounding inflammatory change.  Spleen: Normal in size without focal abnormality.  Adrenals/Urinary Tract: Both adrenal glands appear normal.The kidneys appear stable. There is a stable cyst involving the interpolar region of the left kidney. There is limited contrast excretion from both kidneys. Perinephric soft tissue stranding is stable. There is no evidence of urinary tract calculus. The bladder appears unremarkable.  Stomach/Bowel: Nasogastric tube extends into the mid stomach. The stomach and small bowel are decompressed status post partial small bowel resection. There is contrast material within the colon. The sigmoid colon demonstrates diverticulosis and wall thickening. No extravasated enteric contrast or evidence of bowel obstruction.There is a surgical drain within the upper abdomen. Ascites is again noted with multiple interloop components. In the pelvis, there is a better defined collection in the cul-de-sac which has slightly enlarged. This measures 6.2 x 9.0 cm transverse and is associated with mild enhancement of its margins. This may reflect a developing abscess. No other focal fluid collections are identified. There is a small amount of air within the peritoneal cavity attributed to the recent surgery and/or drain.  Vascular/Lymphatic: There are no enlarged abdominal or pelvic lymph nodes. Diffuse atherosclerosis is again noted with dilatation of the abdominal aorta to 3.0 cm. No large vessel occlusion identified.  Reproductive: Multiple partially calcified uterine fibroids  are noted. No evidence of adnexal mass.  Other: Open incision within the anterior abdominal wall again noted.  Musculoskeletal: No acute or significant osseous  findings.  IMPRESSION: 1. Enlarging fluid collection within the pelvic cul-de-sac is associated with enhancement of its peritoneal margins and may reflect a developing abscess. This may be amenable to trans gluteal drainage. 2. The additional components of ascites throughout the peritoneal cavity are stable without other focal collection. 3. No evidence of bowel obstruction or extravasated enteric contrast status post partial small bowel resection.   Electronically Signed   By: Carey Bullocks M.D.   On: 07/09/2014 21:10   Ct Image Guided Fluid Drain By Catheter  07/10/2014   CLINICAL DATA:  Postop pelvic fluid collection concerning for abscess. sepsis despite broad-spectrum antibiotics  EXAM: CT GUIDED DRAINAGE OF POSTERIOR PELVIC ABSCESS ABSCESS  ANESTHESIA/SEDATION: 1.0 Mg IV Versed 50 mcg IV Fentanyl  Total Moderate Sedation Time:  15 minutes  PROCEDURE: The procedure, risks, benefits, and alternatives were explained to the patient. Questions regarding the procedure were encouraged and answered. The patient understands and consents to the procedure.  The RIGHT TRANS GLUTEAL AREA was prepped with BETADINEin a sterile fashion, and a sterile drape was applied covering the operative field. A sterile gown and sterile gloves were used for the procedure. Local anesthesia was provided with 1% Lidocaine.  Previous imaging reviewed. Patient positioned prone. Noncontrast localization CT performed. The posterior pelvic fluid collection was localized. Under sterile conditions and local anesthesia, an 18 gauge 15 cm access needle was advanced from a medial trans gluteal approach into the collection. Needle position confirmed with CT. Guidewire advanced. Tract dilatation performed to advance a 10 [place] drain. Retention loop formed in the collection. Syringe aspiration yielded 40 cc serosanguineous fluid. Sample sent for Gram stain and culture. Catheter secured with a Prolene suture and connected to external suction bulb.  Sterile dressing applied.  COMPLICATIONS: None immediate  FINDINGS: Imaging confirms needle access into the pelvic fluid collection for drain insertion  IMPRESSION: Successful CT-guided pelvic abscess drain insertion   Electronically Signed   By: Judie Petit.  Shick M.D.   On: 07/10/2014 17:07    Anti-infectives: Anti-infectives    Start     Dose/Rate Route Frequency Ordered Stop   07/07/14 1200  vancomycin (VANCOCIN) IVPB 1000 mg/200 mL premix     1,000 mg 200 mL/hr over 60 Minutes Intravenous Every 24 hours 07/07/14 0937     07/06/14 2000  fluconazole (DIFLUCAN) IVPB 400 mg     400 mg 100 mL/hr over 120 Minutes Intravenous Every 24 hours 07/06/14 1507     07/06/14 1800  piperacillin-tazobactam (ZOSYN) IVPB 2.25 g     2.25 g 100 mL/hr over 30 Minutes Intravenous 4 times per day 07/06/14 1505     07/05/14 2000  fluconazole (DIFLUCAN) IVPB 200 mg  Status:  Discontinued     200 mg 100 mL/hr over 60 Minutes Intravenous Every 24 hours 07/05/14 0757 07/06/14 1507   07/05/14 1400  piperacillin-tazobactam (ZOSYN) IVPB 2.25 g  Status:  Discontinued     2.25 g 100 mL/hr over 30 Minutes Intravenous 3 times per day 07/05/14 0749 07/06/14 1505   07/02/14 1800  vancomycin (VANCOCIN) IVPB 1000 mg/200 mL premix  Status:  Discontinued     1,000 mg 200 mL/hr over 60 Minutes Intravenous Every 24 hours 07/01/14 1858 07/05/14 0801   07/01/14 2200  piperacillin-tazobactam (ZOSYN) IVPB 3.375 g  Status:  Discontinued     3.375 g 100 mL/hr  over 30 Minutes Intravenous 4 times per day 07/01/14 1858 07/05/14 0749   07/01/14 2000  fluconazole (DIFLUCAN) IVPB 400 mg  Status:  Discontinued     400 mg 100 mL/hr over 120 Minutes Intravenous Every 24 hours 07/01/14 1858 07/05/14 0757   07/01/14 1400  fluconazole (DIFLUCAN) IVPB 200 mg  Status:  Discontinued     200 mg 100 mL/hr over 60 Minutes Intravenous Every 24 hours 07/01/14 1330 07/01/14 1853   07/01/14 1000  piperacillin-tazobactam (ZOSYN) IVPB 2.25 g  Status:   Discontinued     2.25 g 100 mL/hr over 30 Minutes Intravenous Every 8 hours 07/01/14 0952 07/01/14 1853   07/01/14 1000  vancomycin (VANCOCIN) 500 mg in sodium chloride 0.9 % 100 mL IVPB     500 mg 100 mL/hr over 60 Minutes Intravenous  Once 07/01/14 0952 07/01/14 1126   06/30/14 1200  vancomycin (VANCOCIN) IVPB 1000 mg/200 mL premix     1,000 mg 200 mL/hr over 60 Minutes Intravenous  Once 06/30/14 0944 06/30/14 1403   06/30/14 1200  ceFEPIme (MAXIPIME) 2 g in dextrose 5 % 50 mL IVPB     2 g 100 mL/hr over 30 Minutes Intravenous  Once 06/30/14 0944 06/30/14 1425   06/30/14 0100  vancomycin (VANCOCIN) 2,000 mg in sodium chloride 0.9 % 500 mL IVPB     2,000 mg 250 mL/hr over 120 Minutes Intravenous  Once 06/30/14 0048 06/30/14 0525   06/30/14 0100  ceFEPIme (MAXIPIME) 2 g in dextrose 5 % 50 mL IVPB     2 g 100 mL/hr over 30 Minutes Intravenous  Once 06/30/14 0048 06/30/14 0322   06/27/14 0600  cefUROXime (ZINACEF) 1.5 g in dextrose 5 % 50 mL IVPB     1.5 g 100 mL/hr over 30 Minutes Intravenous On call to O.R. 06/26/14 1019 06/27/14 0630   06/19/14 1800  oseltamivir (TAMIFLU) capsule 30 mg     30 mg Oral Every M-W-F (1800) 06/19/14 1003 06/22/14 1841   06/18/14 1600  oseltamivir (TAMIFLU) capsule 30 mg  Status:  Discontinued     30 mg Oral Daily 06/18/14 1538 06/19/14 1003      Assessment/Plan: s/p Procedure(s): EXPLORATORY LAPAROTOMY WITH CLOSURE OF PERFORATED PYLORIC ULCER (N/A) SMALL BOWEL RESECTION (N/A) . EXPLORATORY LAPAROTOMY WITH CLOSURE OF PERFORATED PYLORIC ULCER, SMALL BOWEL RESECTION - 07/01/2014 Derrell Lolling Diflucan/Zosyn/Vanc Has NGT - no BM ileus at this point   1B. Open wound - clean -  1C. Pelvic fluid collection perc drain placed  2. Leukocytosis WBC - 23,000- 07/11/2014 down from yesterday   3. Anemia - 7.3 - 07/09/2014 4. ESRD - Creat - 1.99 - 07/10/2014 On CVVH 5. Nutrition - on  TPN 6. Stroke - 06/19/2014 Left hemiplegia - this will probably limit her mobility and return to normal activities 7. Activity - PT ordered 8. On Solucortef - Just started 4/7 - discussed with resident  Principal Problem:  Acute respiratory failure with hypoxia Active Problems:  Cerebral thrombosis with cerebral infarction  Hypertension  ESRD needing dialysis  Obesity (BMI 30-39.9)  Depression  Left renal mass  Chronic diastolic heart failure  H1N1 influenza  Tobacco use disorder  Renal failure (ARF), acute on chronic  Hyperlipidemia  PSVT (paroxysmal supraventricular tachycardia)  Hypoxia  SOB (shortness of breath)  Perforated gastric ulcer  LOS: 24 days    Stephanie Vanwyhe A. 07/11/2014

## 2014-07-11 NOTE — Progress Notes (Signed)
PARENTERAL NUTRITION CONSULT NOTE - Follow-up  Pharmacy Consult for TPN Indication: prolonged ileus  No Known Allergies  Patient Measurements: Height: 5\' 5"  (165.1 cm) Weight: 210 lb 15.7 oz (95.7 kg) IBW/kg (Calculated) : 57  Adjusted body weight: 67  Vital Signs: Temp: 97.5 F (36.4 C) (04/09 0400) Temp Source: Axillary (04/09 0400) BP: 91/53 mmHg (04/09 0700) Intake/Output from previous day: 04/08 0701 - 04/09 0700 In: 5007.5 [I.V.:645.8; Blood:620; IV Piggyback:856.7; TPN:2880] Out: 4733 [Emesis/NG output:230; Drains:30] Intake/Output from this shift:    Labs:  Recent Labs  07/09/14 0430 07/10/14 0415 07/10/14 0715 07/11/14 0557  WBC 25.6* 26.4*  --  23.0*  HGB 6.7* 7.3*  --  7.1*  HCT 21.2* 22.6*  --  21.7*  PLT 31* 40*  --  130*  APTT  --   --  37  --   INR  --   --  1.21  --      Recent Labs  07/09/14 0430  07/09/14 1730 07/10/14 0415 07/10/14 1700  NA 130*  < > 128* 129* 128*  K 3.9  < > 3.9 4.0 3.9  CL 100  < > 97 97 95*  CO2 20  < > 21 23 24   GLUCOSE 201*  < > 188* 185* 207*  BUN 37*  < > 34* 38* 43*  CREATININE 2.11*  < > 1.93* 1.99* 2.02*  CALCIUM 7.1*  < > 7.3* 7.6* 8.0*  MG 2.0  --   --  2.2  --   PHOS 1.5*  < > 2.2* 2.2* 3.1  PROT 4.9*  --   --   --   --   ALBUMIN 1.2*  < > 1.3* 1.3* 1.6*  AST 17  --   --   --   --   ALT 29  --   --   --   --   ALKPHOS 111  --   --   --   --   BILITOT 2.0*  --   --   --   --   < > = values in this interval not displayed. Estimated Creatinine Clearance: 34.7 mL/min (by C-G formula based on Cr of 2.02).    Recent Labs  07/10/14 1955 07/11/14 0033 07/11/14 0429  GLUCAP 160* 171* 271*    Insulin Requirements in the past 24 hours:  17 units Novolog moderate SSI; 25 units regular insulin in TPN; on solucortef  Current Nutrition:  NPO Clinimix to 5/15 (w/o electrolytes) at 110 ml/hr and IVFE 20% at 810ml/hr. This will provide ~2350 kcal and 132g of protein which will meet 100% of calorie and  protein goals  Nutritional Goals: per RD 4/4 2200-2600 kCal, 120-140 grams of protein per day (maximize protein)  Assessment: 59 yo F with complicated hospital course. S/p closure perforated pyloric ulcer, SBR for ischemic necrosis, diffuse peritonitis 3/30. Pharmacy consulted to provide TPN for nutrition support.   GI: NPO, bowel rest. TPN providing 100 % support. NG o/p 230 ml/24h. Drains o/p 30. Albumin 1.3. Prealbumin from 4/1 is 5. 4/4 prealbumin is up to 10. 4/8: placed pelvic abscess drain, 40 cc aspirated   Endo: CBGs above goal of < 180 in ICU pt.  (160, 171, 271) Hydrocortisone started 4/7. Moderate SSI and 25 units regular insulin in TPN.   Lytes: Na at 129>> 129 after 20 mM naphos,  K 3.7  (goal >4 with ileus), Mg 2.2 (goal >2 with ileus), Phos  3 after 20 mM na phos yesterday  Renal: ESRD, new start HD this admission. CRRT restarted on 4/4. MIVF: NS @ 6ml/hr. Plan to continue CRRT, to keep even.   Pulm: Pt extubated 4/2. 4L [place]  Cards: On phenylephrine @ 51mcg/min. Levo off.   Hepatobil: LFTswnl. TG 88.  Neuro: New stroke noted on 3/18.   ID: Day # 11 of vanc/zosyn/diflucan for peritonitis. S/p OR for perforated pyloric ulcer, evacuation of ascites and small bowel resection for ischemic necrosis. WBC 26.4, Afeb. 4/8 placed perc drain of pelvic abscess  Vancomycin 3/29>  VR 4/3 - 38.5 Cefepime 3/29>3/30 Zosyn 3/30> 3/20 fluconazole>  3/30 peritoneal fluid- rare lactobacillus (no sensitivies performed; usually sensitive to PCN) 3/28 blood x2 - ngtd 3/36 c diff - neg  Best Practices: Lovenox, PPI IV  TPN Access: CVC  TPN day#: 9  Plan:  - continue Clinimix 5/15 (w/o electrolytes) at 110 ml/hr and IVFE 20% at 10ml/hr. This will provide ~2350 kcal and 132g of protein which will meet 100% of calorie and protein goals - Continue moderate SSI and increase insulin to 40 units regular insulin in TPN - Standard MVI and trace elements daily in TPN - F/U am  labs  Herby Abraham, Pharm.D. [phone] 07/11/2014 7:39 AM

## 2014-07-11 NOTE — Progress Notes (Signed)
[facility] Progress Note   Subjective: perc drain put in yest for fluid collection/ sepsis, today is off of pressors first time and BP up 150/90  Filed Vitals:   07/11/14 0830 07/11/14 0900 07/11/14 1000 07/11/14 1030  BP: 148/89 151/93 151/101 101/66  Pulse:      Temp:      TempSrc:      Resp: 16 15 14 15   Height:      Weight:      SpO2:    100%   Exam: Lethargic, arouses easily, no distress, NG in place No jvd Chest clear bilat RRR no MRG Abd distended, large dressing in place, drain R abd, dec'd BS 1-2+ pitting dependent edema LE's/hips bilat No UE edema  HD: new start Max inpt weight 106kg, minimum 94.1kg        Assessment: 1. Gastric ulcer w perforation , s/p repair 3/30 - on vanc/ zosyn, neo gtt 2. S/P acute ACA stroke / left hemiparesis 3. Vol excess - due to TNA.  4. ESRD new start to HD; cont CRRT 5. 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  No binders d/t low phos 7. Vol excess- improving 8. COPD/ tobacco 9. S/P influenza H1N1 10. 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 HD access - we are using Permcath placed 3/26) d/t AVF problems; when stable [facility]VS wants to considering revision of AVF  Plan - cont CRRT, increase UF now that pressors off.      Vinson Moselleob Ariahna Smiddy MD  pager [phone]    cell [phone]  07/11/2014, 10:32 AM     Recent Labs Lab 07/10/14 0415 07/10/14 1700 07/11/14 0557  NA 129* 128* 129*  K 4.0 3.9 3.7  CL 97 95* 97  CO2 23 24 29   GLUCOSE 185* 207* 211*  BUN 38* 43* 39*  CREATININE 1.99* 2.02* 1.74*  CALCIUM 7.6* 8.0* 8.0*  PHOS 2.2* 3.1 3.0    Recent Labs Lab 07/06/14 0552  07/09/14 0430  07/10/14 0415 07/10/14 1700 07/11/14 0557  AST 25  --  17  --   --   --   --   ALT 69*  --  29  --   --   --   --   ALKPHOS 75  --  111  --   --   --   --   BILITOT 1.9*  --  2.0*  --   --   --   --   PROT 4.5*  --  4.9*  --   --   --   --   ALBUMIN 1.4*  < > 1.2*  < > 1.3* 1.6* 1.6*  < > = values in this interval  not displayed.  Recent Labs Lab 07/09/14 0430 07/10/14 0415 07/11/14 0557  WBC 25.6* 26.4* 23.0*  NEUTROABS 23.3* 24.3* 20.7*  HGB 6.7* 7.3* 7.1*  HCT 21.2* 22.6* 21.7*  MCV 81.2 81.0 83.1  PLT 31* 40* 130*   . sodium chloride   Intravenous Once  . chlorhexidine  15 mL Mouth Rinse BID  . darbepoetin (ARANESP) injection - DIALYSIS  150 mcg Intravenous Q Sat-HD  . fluconazole (DIFLUCAN) IV  400 mg Intravenous Q24H  . hydrocortisone sodium succinate  50 mg Intravenous Q6H  . insulin aspart  0-15 Units Subcutaneous 6 times per day  . ipratropium  0.5 mg Nebulization Q6H  . levalbuterol  0.63 mg Nebulization Q6H  . pantoprazole (PROTONIX) IV  40 mg Intravenous Q12H  .  piperacillin-tazobactam (ZOSYN)  IV  2.25 g Intravenous 4 times per day  . vancomycin  1,000 mg Intravenous Q24H   . sodium chloride 10 mL/hr at 07/10/14 0700  . TPN (CLINIMIX) Adult without lytes 110 mL/hr at 07/10/14 1725   And  . fat emulsion 240 mL (07/10/14 1725)  . TPN (CLINIMIX) Adult without lytes     And  . fat emulsion    . norepinephrine (LEVOPHED) Adult infusion Stopped (07/08/14 2250)  . phenylephrine (NEO-SYNEPHRINE) Adult infusion Stopped (07/11/14 1000)  . dialysis replacement fluid (prismasate) 400 mL/hr at 07/11/14 0945  . dialysis replacement fluid (prismasate) 200 mL/hr at 07/10/14 0543  . dialysate (PRISMASATE) 1,500 mL/hr at 07/11/14 0735   albuterol, anticoagulant sodium citrate, fentaNYL, metoprolol, morphine injection, ondansetron (ZOFRAN) IV, sodium chloride

## 2014-07-11 NOTE — Progress Notes (Signed)
[name], female   DOB: Aug 15, 1955, 59 y.o.   MRN: [id number]    Referring Physician(s): CCS  Subjective:  Pt on HD; states she is hungry; no sig abd pain   Allergies: Review of patient's allergies indicates no known allergies.  Medications: Prior to Admission medications   Medication Sig Start Date End Date Taking? Authorizing Provider  allopurinol (ZYLOPRIM) 100 MG tablet Take 100 mg by mouth 2 (two) times daily as needed (for gout).   Yes Historical Provider, MD  citalopram (CELEXA) 20 MG tablet Take 20 mg by mouth daily.   Yes Historical Provider, MD  furosemide (LASIX) 80 MG tablet Take 80 mg by mouth daily.   Yes Historical Provider, MD  hydrOXYzine (ATARAX/VISTARIL) 25 MG tablet Take 25 mg by mouth 3 (three) times daily as needed for anxiety.   Yes Historical Provider, MD  oxyCODONE (ROXICODONE) 5 MG immediate release tablet Take 1 tablet (5 mg total) by mouth every 6 (six) hours as needed for severe pain. Patient not taking: Reported on 06/17/2014 01/28/14   Raymond Gurney, PA-C     Vital Signs: BP 107/73 mmHg  Pulse 95  Temp(Src) 97.4 F (36.3 C) (Axillary)  Resp 15  Ht  (1.651 m)  Wt 210 lb 15.7 oz (95.7 kg)  BMI 35.11 kg/m2  SpO2 99%  Physical Exam rt TG drain intact, insertion site with minimal tenderness; output 20-30 cc's yellow fluid; cx's pend  Imaging: Ct Abdomen Pelvis W Contrast  07/09/2014   CLINICAL DATA:  Ongoing sepsis. Evaluate for abscess following recent abdominal surgery for small bowel perforation. End-stage renal disease.  EXAM: CT ABDOMEN AND PELVIS WITH CONTRAST  TECHNIQUE: Multidetector CT imaging of the abdomen and pelvis was performed using the standard protocol following bolus administration of intravenous contrast.  CONTRAST:  80mL OMNIPAQUE IOHEXOL 300 MG/ML  SOLN  COMPARISON:  Prior CTs 07/01/2014 and 07/06/2014.  FINDINGS: Lower chest: Left pleural effusion and adjacent left lower lobe atelectasis appear mildly  improved. There is no pericardial effusion. The heart is moderately enlarged.  Hepatobiliary: Contrast bolus is limited. No focal hepatic abnormalities identified. No evidence of gallstones, gallbladder wall thickening or biliary dilatation.  Pancreas: Stable appearance. A small fatty cleft within the pancreatic head is unchanged. No focal surrounding inflammatory change.  Spleen: Normal in size without focal abnormality.  Adrenals/Urinary Tract: Both adrenal glands appear normal.The kidneys appear stable. There is a stable cyst involving the interpolar region of the left kidney. There is limited contrast excretion from both kidneys. Perinephric soft tissue stranding is stable. There is no evidence of urinary tract calculus. The bladder appears unremarkable.  Stomach/Bowel: Nasogastric tube extends into the mid stomach. The stomach and small bowel are decompressed status post partial small bowel resection. There is contrast material within the colon. The sigmoid colon demonstrates diverticulosis and wall thickening. No extravasated enteric contrast or evidence of bowel obstruction.There is a surgical drain within the upper abdomen. Ascites is again noted with multiple interloop components. In the pelvis, there is a better defined collection in the cul-de-sac which has slightly enlarged. This measures 6.2 x 9.0 cm transverse and is associated with mild enhancement of its margins. This may reflect a developing abscess. No other focal fluid collections are identified. There is a small amount of air within the peritoneal cavity attributed to the recent surgery and/or drain.  Vascular/Lymphatic: There are no enlarged abdominal or pelvic lymph nodes. Diffuse atherosclerosis is again noted with dilatation of the abdominal aorta  to 3.0 cm. No large vessel occlusion identified.  Reproductive: Multiple partially calcified uterine fibroids are noted. No evidence of adnexal mass.  Other: Open incision within the anterior  abdominal wall again noted.  Musculoskeletal: No acute or significant osseous findings.  IMPRESSION: 1. Enlarging fluid collection within the pelvic cul-de-sac is associated with enhancement of its peritoneal margins and may reflect a developing abscess. This may be amenable to trans gluteal drainage. 2. The additional components of ascites throughout the peritoneal cavity are stable without other focal collection. 3. No evidence of bowel obstruction or extravasated enteric contrast status post partial small bowel resection.   Electronically Signed   By: Carey Bullocks M.D.   On: 07/09/2014 21:10   Dg Chest Port 1 View  07/09/2014   CLINICAL DATA:  End-stage renal disease, pulmonary edema, acute respiratory failure.  EXAM: PORTABLE CHEST - 1 VIEW  COMPARISON:  Portable chest x-ray of July 07, 2014  FINDINGS: The patient is somewhat rotated toward the left. The right lung is well-expanded. There is persistent increased density in the left lower lobe with better visualization of the hemidiaphragm however. The cardiac silhouette remains enlarged. The central pulmonary vascularity is engorged. A small left pleural effusion is suspected.  The endotracheal tube tip lies approximately 4.9 cm above the carina. The dialysis type catheter tip projects over the midportion of the SVC. The left internal jugular venous catheter tip projects at the junction of the left internal jugular vein with the left subclavian vein. The esophagogastric tube tip projects below the inferior margin of the image.  IMPRESSION: There has been slight interval improvement in the appearance of the left lower lobe. There is persistent atelectasis or pneumonia with small left pleural effusion. There is also mild overall improvement in the pulmonary interstitial edema.   Electronically Signed   By: David  [place]   On: 07/09/2014 07:23   Ct Image Guided Fluid Drain By Catheter  07/10/2014   CLINICAL DATA:  Postop pelvic fluid collection concerning  for abscess. sepsis despite broad-spectrum antibiotics  EXAM: CT GUIDED DRAINAGE OF POSTERIOR PELVIC ABSCESS ABSCESS  ANESTHESIA/SEDATION: 1.0 Mg IV Versed 50 mcg IV Fentanyl  Total Moderate Sedation Time:  15 minutes  PROCEDURE: The procedure, risks, benefits, and alternatives were explained to the patient. Questions regarding the procedure were encouraged and answered. The patient understands and consents to the procedure.  The RIGHT TRANS GLUTEAL AREA was prepped with BETADINEin a sterile fashion, and a sterile drape was applied covering the operative field. A sterile gown and sterile gloves were used for the procedure. Local anesthesia was provided with 1% Lidocaine.  Previous imaging reviewed. Patient positioned prone. Noncontrast localization CT performed. The posterior pelvic fluid collection was localized. Under sterile conditions and local anesthesia, an 18 gauge 15 cm access needle was advanced from a medial trans gluteal approach into the collection. Needle position confirmed with CT. Guidewire advanced. Tract dilatation performed to advance a 10 [place] drain. Retention loop formed in the collection. Syringe aspiration yielded 40 cc serosanguineous fluid. Sample sent for Gram stain and culture. Catheter secured with a Prolene suture and connected to external suction bulb. Sterile dressing applied.  COMPLICATIONS: None immediate  FINDINGS: Imaging confirms needle access into the pelvic fluid collection for drain insertion  IMPRESSION: Successful CT-guided pelvic abscess drain insertion   Electronically Signed   By: Judie Petit.  Shick M.D.   On: 07/10/2014 17:07    Labs:  CBC:  Recent Labs  07/08/14 2238 07/09/14 0430 07/10/14 0415 07/11/14  0557  WBC 23.3* 25.6* 26.4* 23.0*  HGB 7.0* 6.7* 7.3* 7.1*  HCT 21.7* 21.2* 22.6* 21.7*  PLT 35* 31* 40* 130*    COAGS:  Recent Labs  07/01/14 1445 07/01/14 1813 07/02/14 0430 07/03/14 0445 07/04/14 0510 07/07/14 0400 07/10/14 0715  INR 1.44 1.83*  1.51*  --   --   --  1.21  APTT  --   --  35 38* 36 41* 37    BMP:  Recent Labs  07/09/14 1730 07/10/14 0415 07/10/14 1700 07/11/14 0557  NA 128* 129* 128* 129*  K 3.9 4.0 3.9 3.7  CL 97 97 95* 97  CO2 21 23 24 29   GLUCOSE 188* 185* 207* 211*  BUN 34* 38* 43* 39*  CALCIUM 7.3* 7.6* 8.0* 8.0*  CREATININE 1.93* 1.99* 2.02* 1.74*  GFRNONAA 27* 26* 26* 31*  GFRAA 32* 31* 30* 36*    LIVER FUNCTION TESTS:  Recent Labs  07/02/14 0430  07/03/14 0445  07/06/14 0552  07/09/14 0430  07/09/14 1730 07/10/14 0415 07/10/14 1700 07/11/14 0557  BILITOT 2.8*  --  1.8*  --  1.9*  --  2.0*  --   --   --   --   --   AST 757*  --  231*  --  25  --  17  --   --   --   --   --   ALT 455*  --  302*  --  69*  --  29  --   --   --   --   --   ALKPHOS 65  --  78  --  75  --  111  --   --   --   --   --   PROT 5.2*  --  5.3*  --  4.5*  --  4.9*  --   --   --   --   --   ALBUMIN 2.4*  < > 2.0*  < > 1.4*  < > 1.2*  < > 1.3* 1.3* 1.6* 1.6*  < > = values in this interval not displayed.  Assessment and Plan: S/p drainage of post pelvic fluid collection 4/8; check final cx's; WBC down slightly to 23(26.4), hgb 7.1(7.3); cont current tx; check f/u CT next week; other plans as per CCS/nephro/CCM   Signed: ALLRED,D KEVIN 07/11/2014, 11:38 AM   I spent a total of 15 minutes  in face to face in clinical consultation/evaluation, greater than 50% of which was counseling/coordinating care for pelvic fluid collection drainage

## 2014-07-11 NOTE — Progress Notes (Signed)
Name: [name] MRN: [id number] DOB: 08-14-1955    ADMISSION DATE:  06/17/2014 CONSULTATION DATE:  3/30  REFERRING MD :  Mahala Menghini   CHIEF COMPLAINT:  Hypoxia   BRIEF PATIENT DESCRIPTION: 59 yo female smoker with hx HTN, ESRD on HD (just began this admit), COPD, failed L AV fistula, initially admitted 3/16 with flu, acute hypoxic resp failure.  Ultimately tx to [facility] for worsening metabolic parameters and need for HD.  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  Has made little improvement and on 3/30 has worsening hypoxia, generalized pain, hyperkalemia, worsening leukocytosis and PCCM consulted.     SIGNIFICANT EVENTS  3/16 influenza A+ 3/18 R ACA stroke 3/31 to or ex lap for per pyloric ulcer and sm bowel resection 4/1- septic shock on pressors 4/3-pressors weaned off  4/4- restart pressors, CRRT 4/5- run of SVTs 4/6- off levo switched to neo 4/7- pelvic abscess on CT 4/8 - IR placed pelvic drain  STUDIES:  3/19 MRI: multifocal acute infarction of both hemispheres, large right distal ACA 3/22 TEE: severe RAE, severely reduced RV function, severe TR, no LAA thrombus  3/30 CT chest/abd: perf bowel 2/2 presumed embolism in setting of ACA, ?rectrovaginal abscess development, LLL collapse 4/4 CT abd/pelvis: small pleural effusion left, no leaking contrast or SBO findings  4/7 CT abd/pelvis: increased pelvic abscess growth   SUBJECTIVE:  More alert.  Still has abd pain, but less.  VITAL SIGNS: Temp:  [97.3 F (36.3 C)-97.9 F (36.6 C)] 97.5 F (36.4 C) (04/09 0400) Pulse Rate:  [84-103] 95 (04/08 1730) Resp:  [13-21] 16 (04/09 0700) BP: (76-149)/(38-117) 91/53 mmHg (04/09 0700) SpO2:  [60 %-100 %] 100 % (04/09 0700) Weight:  [210 lb 15.7 oz (95.7 kg)] 210 lb 15.7 oz (95.7 kg) (04/09 0500)  PHYSICAL EXAMINATION: General:  pleasant Neuro: follows commands, left arm and leg paralysis HEENT:  Pupils reactive Cardiovascular: RRR, no rubs murmurs  or gallops Lungs: no wheeze Abdomen:  Round, mildly distended, tender, hypoactive bs , dressing intact c/d/i  Musculoskeletal:  Warm and dry, 1+ pitting edema  CBC Recent Labs     07/09/14  0430  07/10/14  0415  07/11/14  0557  WBC  25.6*  26.4*  23.0*  HGB  6.7*  7.3*  7.1*  HCT  21.2*  22.6*  21.7*  PLT  31*  40*  130*    Coag's Recent Labs     07/10/14  0715  APTT  37  INR  1.21    BMET Recent Labs     07/10/14  0415  07/10/14  1700  07/11/14  0557  NA  129*  128*  129*  K  4.0  3.9  3.7  CL  97  95*  97  CO2  BUN  38*  43*  39*  CREATININE  1.99*  2.02*  1.74*  GLUCOSE  185*  207*  211*    Electrolytes Recent Labs     07/09/14  0430   07/10/14  0415  07/10/14  1700  07/11/14  0557  CALCIUM  7.1*   < >  7.6*  8.0*  8.0*  MG  2.0   --   2.2   --   2.2  PHOS  1.5*   < >  2.2*  3.1  3.0   < > = values in this interval not displayed.    Liver Enzymes  Recent Labs     07/09/14  0430   07/10/14  0415  07/10/14  1700  07/11/14  0557  AST  17   --    --    --    --   ALT  29   --    --    --    --   ALKPHOS  111   --    --    --    --   BILITOT  2.0*   --    --    --    --   ALBUMIN  1.2*   < >  1.3*  1.6*  1.6*   < > = values in this interval not displayed.   Glucose Recent Labs     07/10/14  0722  07/10/14  1148  07/10/14  1714  07/10/14  1955  07/11/14  0033  07/11/14  0429  GLUCAP  207*  156*  200*  160*  171*  271*    Imaging Ct Abdomen Pelvis W Contrast  07/09/2014   CLINICAL DATA:  Ongoing sepsis. Evaluate for abscess following recent abdominal surgery for small bowel perforation. End-stage renal disease.  EXAM: CT ABDOMEN AND PELVIS WITH CONTRAST  TECHNIQUE: Multidetector CT imaging of the abdomen and pelvis was performed using the standard protocol following bolus administration of intravenous contrast.  CONTRAST:  80mL OMNIPAQUE IOHEXOL 300 MG/ML  SOLN  COMPARISON:  Prior CTs 07/01/2014 and 07/06/2014.  FINDINGS: Lower  chest: Left pleural effusion and adjacent left lower lobe atelectasis appear mildly improved. There is no pericardial effusion. The heart is moderately enlarged.  Hepatobiliary: Contrast bolus is limited. No focal hepatic abnormalities identified. No evidence of gallstones, gallbladder wall thickening or biliary dilatation.  Pancreas: Stable appearance. A small fatty cleft within the pancreatic head is unchanged. No focal surrounding inflammatory change.  Spleen: Normal in size without focal abnormality.  Adrenals/Urinary Tract: Both adrenal glands appear normal.The kidneys appear stable. There is a stable cyst involving the interpolar region of the left kidney. There is limited contrast excretion from both kidneys. Perinephric soft tissue stranding is stable. There is no evidence of urinary tract calculus. The bladder appears unremarkable.  Stomach/Bowel: Nasogastric tube extends into the mid stomach. The stomach and small bowel are decompressed status post partial small bowel resection. There is contrast material within the colon. The sigmoid colon demonstrates diverticulosis and wall thickening. No extravasated enteric contrast or evidence of bowel obstruction.There is a surgical drain within the upper abdomen. Ascites is again noted with multiple interloop components. In the pelvis, there is a better defined collection in the cul-de-sac which has slightly enlarged. This measures 6.2 x 9.0 cm transverse and is associated with mild enhancement of its margins. This may reflect a developing abscess. No other focal fluid collections are identified. There is a small amount of air within the peritoneal cavity attributed to the recent surgery and/or drain.  Vascular/Lymphatic: There are no enlarged abdominal or pelvic lymph nodes. Diffuse atherosclerosis is again noted with dilatation of the abdominal aorta to 3.0 cm. No large vessel occlusion identified.  Reproductive: Multiple partially calcified uterine fibroids are  noted. No evidence of adnexal mass.  Other: Open incision within the anterior abdominal wall again noted.  Musculoskeletal: No acute or significant osseous findings.  IMPRESSION: 1. Enlarging fluid collection within the pelvic cul-de-sac is associated with enhancement of its peritoneal margins and may reflect a developing abscess. This may be amenable to trans gluteal drainage. 2.  The additional components of ascites throughout the peritoneal cavity are stable without other focal collection. 3. No evidence of bowel obstruction or extravasated enteric contrast status post partial small bowel resection.   Electronically Signed   By: Carey BullocksWilliam  Veazey M.D.   On: 07/09/2014 21:10   Ct Image Guided Fluid Drain By Catheter  07/10/2014   CLINICAL DATA:  Postop pelvic fluid collection concerning for abscess. sepsis despite broad-spectrum antibiotics  EXAM: CT GUIDED DRAINAGE OF POSTERIOR PELVIC ABSCESS ABSCESS  ANESTHESIA/SEDATION: 1.0 Mg IV Versed 50 mcg IV Fentanyl  Total Moderate Sedation Time:  15 minutes  PROCEDURE: The procedure, risks, benefits, and alternatives were explained to the patient. Questions regarding the procedure were encouraged and answered. The patient understands and consents to the procedure.  The RIGHT TRANS GLUTEAL AREA was prepped with BETADINEin a sterile fashion, and a sterile drape was applied covering the operative field. A sterile gown and sterile gloves were used for the procedure. Local anesthesia was provided with 1% Lidocaine.  Previous imaging reviewed. Patient positioned prone. Noncontrast localization CT performed. The posterior pelvic fluid collection was localized. Under sterile conditions and local anesthesia, an 18 gauge 15 cm access needle was advanced from a medial trans gluteal approach into the collection. Needle position confirmed with CT. Guidewire advanced. Tract dilatation performed to advance a 10 [place] drain. Retention loop formed in the collection. Syringe aspiration  yielded 40 cc serosanguineous fluid. Sample sent for Gram stain and culture. Catheter secured with a Prolene suture and connected to external suction bulb. Sterile dressing applied.  COMPLICATIONS: None immediate  FINDINGS: Imaging confirms needle access into the pelvic fluid collection for drain insertion  IMPRESSION: Successful CT-guided pelvic abscess drain insertion   Electronically Signed   By: Judie PetitM.  Shick M.D.   On: 07/10/2014 17:07     ASSESSMENT / PLAN: ETT 3/31 >> 4/02  PULMONARY A: Acute hypoxic respiratory failure 2nd to H1N1, hypervolemia, HCAP. Hx of COPD. Atelectasis. Night time hypoxia concern for OHS or OSA will add prn CPAP qhs P: Oxygen to keep SpO2 > 92% Bronchial hygiene Negative fluid balance as tolerated Scheduled BD's CPAP qhs as tolerated  CARDIAC A: Chronic dCHF - EF 60-65%, grade 1 diastolic dysfunction. SVT runs 4/04>>transitioned to neo (pt good EF 55-60%) Hypotensive. P: Pt transitioned to neo since had continued SVTs to keep MAP > 65 Pt on solucortef on 4/7 for ongoing hypotension will consider weaning off once clinically improves  GASTROENTEROLOGY A: Pyloric ulcer perforation s/p laparotomy P: Post op care per CCS TPN per [facility] Continue protonix Prolonged ileus with no flatus or BM, Pelvic drain per IR  RENAL: A: ESRD P: CVVH beginning to pull fluid  HEMATOLOGY A: Anemia of critical illness and chronic disease. Thrombocytopenia. Pltlt 42. HIT only slightly + but serotonin assay negative, therefore will hold off argatroban at this time could be component of Abx toxicity or ongoing sepsis  P: F/u CBC SCD's Hgb>7 goal   INFECTION A: Peritonitis 2nd to perforated pyloric ulcer >> f/u CT 4/08 concern for abscess. HCAP with CXR improving IR for aspiration/drain of pelvic fluid collection suspected abscess P: Day 11 of Abx, currently on zosyn, vancomycin, diflucan  3/30 anaerobic>>rare lactobacillus  4/08 Pelvic fluid >>    ENDOCRINE A: DM type II. P: SSI  NEUROLOGY A: ACA CVA with Lt sided weakness. Deconditioning. P: Off ASA due to thrombocytopenia PT/OT when able   CC time is 35 minutes.  Coralyn HellingVineet Tyrea Froberg, MD [facility] Pulmonary/Critical Care 07/11/2014, 7:53 AM Pager:  [phone] After 3pm call: [phone]

## 2014-07-12 ENCOUNTER — Inpatient Hospital Stay (HOSPITAL_COMMUNITY): Payer: Medicaid Other

## 2014-07-12 LAB — GLUCOSE, CAPILLARY
GLUCOSE-CAPILLARY: 125 mg/dL — AB (ref 70–99)
GLUCOSE-CAPILLARY: 114 mg/dL — AB (ref 70–99)
GLUCOSE-CAPILLARY: 151 mg/dL — AB (ref 70–99)
Glucose-Capillary: 112 mg/dL — ABNORMAL HIGH (ref 70–99)
Glucose-Capillary: 117 mg/dL — ABNORMAL HIGH (ref 70–99)
Glucose-Capillary: 140 mg/dL — ABNORMAL HIGH (ref 70–99)

## 2014-07-12 LAB — RENAL FUNCTION PANEL
ALBUMIN: 1.7 g/dL — AB (ref 3.5–5.2)
Albumin: 1.6 g/dL — ABNORMAL LOW (ref 3.5–5.2)
Anion gap: 8 (ref 5–15)
Anion gap: 10 (ref 5–15)
BUN: 42 mg/dL — AB (ref 6–23)
BUN: 39 mg/dL — ABNORMAL HIGH (ref 6–23)
CALCIUM: 7.8 mg/dL — AB (ref 8.4–10.5)
CO2: 25 mmol/L (ref 19–32)
CO2: 22 mmol/L (ref 19–32)
Calcium: 8 mg/dL — ABNORMAL LOW (ref 8.4–10.5)
Chloride: 96 mmol/L (ref 96–112)
Chloride: 96 mmol/L (ref 96–112)
Creatinine, Ser: 1.7 mg/dL — ABNORMAL HIGH (ref 0.50–1.10)
Creatinine, Ser: 1.58 mg/dL — ABNORMAL HIGH (ref 0.50–1.10)
GFR calc Af Amer: 37 mL/min — ABNORMAL LOW (ref >90)
GFR calc Af Amer: 41 mL/min — ABNORMAL LOW (ref >90)
GFR calc non Af Amer: 35 mL/min — ABNORMAL LOW (ref >90)
GFR, EST NON AFRICAN AMERICAN: 32 mL/min — AB (ref >90)
Glucose, Bld: 195 mg/dL — ABNORMAL HIGH (ref 70–99)
Glucose, Bld: 140 mg/dL — ABNORMAL HIGH (ref 70–99)
PHOSPHORUS: 2.8 mg/dL (ref 2.3–4.6)
POTASSIUM: 3.6 mmol/L (ref 3.5–5.1)
POTASSIUM: 4.1 mmol/L (ref 3.5–5.1)
Phosphorus: 2 mg/dL — ABNORMAL LOW (ref 2.3–4.6)
SODIUM: 129 mmol/L — AB (ref 135–145)
SODIUM: 128 mmol/L — AB (ref 135–145)

## 2014-07-12 LAB — CBC WITH DIFFERENTIAL/PLATELET
Basophils Absolute: 0 10*3/uL (ref 0.0–0.1)
Basophils Relative: 0 % (ref 0–1)
Eosinophils Absolute: 0 10*3/uL (ref 0.0–0.7)
Eosinophils Relative: 0 % (ref 0–5)
HCT: 21.7 % — ABNORMAL LOW (ref 36.0–46.0)
Hemoglobin: 7 g/dL — ABNORMAL LOW (ref 12.0–15.0)
LYMPHS PCT: 3 % — AB (ref 12–46)
Lymphs Abs: 0.6 10*3/uL — ABNORMAL LOW (ref 0.7–4.0)
MCH: 27.3 pg (ref 26.0–34.0)
MCHC: 32.3 g/dL (ref 30.0–36.0)
MCV: 84.8 fL (ref 78.0–100.0)
Monocytes Absolute: 1.2 10*3/uL — ABNORMAL HIGH (ref 0.1–1.0)
Monocytes Relative: 6 % (ref 3–12)
Neutro Abs: 18.8 10*3/uL — ABNORMAL HIGH (ref 1.7–7.7)
Neutrophils Relative %: 91 % — ABNORMAL HIGH (ref 43–77)
PLATELETS: 108 10*3/uL — AB (ref 150–400)
RBC: 2.56 MIL/uL — ABNORMAL LOW (ref 3.87–5.11)
RDW: 21.1 % — ABNORMAL HIGH (ref 11.5–15.5)
WBC: 20.6 10*3/uL — ABNORMAL HIGH (ref 4.0–10.5)

## 2014-07-12 LAB — MAGNESIUM: Magnesium: 2.2 mg/dL (ref 1.5–2.5)

## 2014-07-12 MED ORDER — HYDROCORTISONE NA SUCCINATE PF 100 MG IJ SOLR
50.0000 mg | Freq: Two times a day (BID) | INTRAMUSCULAR | Status: DC
  Administered 2014-07-12 – 2014-07-13 (×2): 50 mg via INTRAVENOUS
  Filled 2014-07-12 (×4): qty 1

## 2014-07-12 MED ORDER — PRISMASOL BGK 0/2.5 32-2.5 MEQ/L IV SOLN
INTRAVENOUS | Status: DC
  Administered 2014-07-12 – 2014-07-13 (×6): via INTRAVENOUS_CENTRAL
  Filled 2014-07-12 (×14): qty 5000

## 2014-07-12 MED ORDER — FAT EMULSION 20 % IV EMUL
240.0000 mL | INTRAVENOUS | Status: AC
  Administered 2014-07-12: 240 mL via INTRAVENOUS
  Filled 2014-07-12: qty 250

## 2014-07-12 MED ORDER — SODIUM PHOSPHATE 3 MMOLE/ML IV SOLN
30.0000 mmol | Freq: Once | INTRAVENOUS | Status: AC
  Administered 2014-07-12: 30 mmol via INTRAVENOUS
  Filled 2014-07-12: qty 10

## 2014-07-12 MED ORDER — TRACE MINERALS CR-CU-F-FE-I-MN-MO-SE-ZN IV SOLN
INTRAVENOUS | Status: AC
  Administered 2014-07-12: 17:00:00 via INTRAVENOUS
  Filled 2014-07-12: qty 2640

## 2014-07-12 MED ORDER — POTASSIUM CHLORIDE 10 MEQ/50ML IV SOLN
10.0000 meq | INTRAVENOUS | Status: AC
  Administered 2014-07-12 (×3): 10 meq via INTRAVENOUS
  Filled 2014-07-12 (×3): qty 50

## 2014-07-12 MED ORDER — PRISMASOL BGK 0/2.5 32-2.5 MEQ/L IV SOLN
INTRAVENOUS | Status: DC
  Filled 2014-07-12 (×4): qty 5000

## 2014-07-12 MED ORDER — PRISMASOL BGK 0/2.5 32-2.5 MEQ/L IV SOLN
INTRAVENOUS | Status: DC
  Administered 2014-07-12 (×2): via INTRAVENOUS_CENTRAL
  Filled 2014-07-12 (×10): qty 5000

## 2014-07-12 MED ORDER — POTASSIUM CHLORIDE 10 MEQ/100ML IV SOLN
10.0000 meq | INTRAVENOUS | Status: DC
  Administered 2014-07-12: 10 meq via INTRAVENOUS
  Filled 2014-07-12: qty 100

## 2014-07-12 MED ORDER — PRISMASOL BGK 4/2.5 32-4-2.5 MEQ/L IV SOLN
INTRAVENOUS | Status: DC
  Filled 2014-07-12 (×5): qty 5000

## 2014-07-12 NOTE — Progress Notes (Signed)
Name: [name] MRN: [id number] DOB: 12/05/1955    ADMISSION DATE:  06/17/2014 CONSULTATION DATE:  3/30  REFERRING MD :  Mahala MenghiniSamtani   CHIEF COMPLAINT:  Hypoxia   BRIEF PATIENT DESCRIPTION: 59 yo female smoker with hx HTN, ESRD on HD (just began this admit), COPD, failed L AV fistula, initially admitted 3/16 with flu, acute hypoxic resp failure.  [facility] for worsening metabolic parameters and need for HD.  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  Has made little improvement and on 3/30 has worsening hypoxia, generalized pain, hyperkalemia, worsening leukocytosis and PCCM consulted.     SIGNIFICANT EVENTS  3/16 influenza A+ 3/18 R ACA stroke 3/31 to or ex lap for per pyloric ulcer and sm bowel resection 4/1- septic shock on pressors 4/3-pressors weaned off  4/4- restart pressors, CRRT 4/5- run of SVTs 4/6- off levo switched to neo 4/7- pelvic abscess on CT 4/8 - IR placed pelvic drain 4/9 - off pressors  STUDIES:  3/19 MRI: multifocal acute infarction of both hemispheres, large right distal ACA 3/22 TEE: severe RAE, severely reduced RV function, severe TR, no LAA thrombus  3/30 CT chest/abd: perf bowel 2/2 presumed embolism in setting of ACA, ?rectrovaginal abscess development, LLL collapse 4/4 CT abd/pelvis: small pleural effusion left, no leaking contrast or SBO findings  4/7 CT abd/pelvis: increased pelvic abscess growth   SUBJECTIVE:  More alert.  Still has abd pain, but less.  VITAL SIGNS: Temp:  [97.3 F (36.3 C)-97.7 F (36.5 C)] 97.7 F (36.5 C) (04/10 0800) Resp:  [13-25] 15 (04/10 0830) BP: (101-151)/(65-119) 109/88 mmHg (04/10 0830) SpO2:  [91 %-100 %] 99 % (04/10 0800) Weight:  [207 lb 0.2 oz (93.9 kg)] 207 lb 0.2 oz (93.9 kg) (04/10 0500)  PHYSICAL EXAMINATION: General:  pleasant Neuro: follows commands, left arm and leg paralysis HEENT:  Pupils reactive Cardiovascular: RRR, no rubs murmurs or gallops Lungs: no  wheeze Abdomen:  Round, mildly distended, tender, hypoactive bs , dressing intact c/d/i  Musculoskeletal:  Warm and dry, 1+ pitting edema  CBC Recent Labs     07/10/14  0415  07/11/14  0557  07/12/14  0430  WBC  26.4*  23.0*  20.6*  HGB  7.3*  7.1*  7.0*  HCT  22.6*  21.7*  21.7*  PLT  40*  130*  108*    Coag's Recent Labs     07/10/14  0715  APTT  37  INR  1.21    BMET Recent Labs     07/11/14  0557  07/11/14  1640  07/12/14  0430  NA  129*  129*  129*  K  3.7  3.7  3.6  CL  97  94*  96  CO2  29  23  25   BUN  39*  38*  42*  CREATININE  1.74*  1.58*  1.70*  GLUCOSE  211*  193*  195*    Electrolytes Recent Labs     07/10/14  0415   07/11/14  0557  07/11/14  1640  07/12/14  0430  CALCIUM  7.6*   < >  8.0*  8.1*  8.0*  MG  2.2   --   2.2   --   2.2  PHOS  2.2*   < >  3.0  2.3  2.0*   < > = values in this interval not displayed.    Liver Enzymes Recent Labs  07/11/14  0557  07/11/14  1640  07/12/14  0430  ALBUMIN  1.6*  1.6*  1.6*   Glucose Recent Labs     07/11/14  1131  07/11/14  1513  07/11/14  1953  07/12/14  0027  07/12/14  0401  07/12/14  0725  GLUCAP  116*  111*  129*  112*  125*  117*    Imaging Ct Image Guided Fluid Drain By Catheter  07/10/2014   CLINICAL DATA:  Postop pelvic fluid collection concerning for abscess. sepsis despite broad-spectrum antibiotics  EXAM: CT GUIDED DRAINAGE OF POSTERIOR PELVIC ABSCESS ABSCESS  ANESTHESIA/SEDATION: 1.0 Mg IV Versed 50 mcg IV Fentanyl  Total Moderate Sedation Time:  15 minutes  PROCEDURE: The procedure, risks, benefits, and alternatives were explained to the patient. Questions regarding the procedure were encouraged and answered. The patient understands and consents to the procedure.  The RIGHT TRANS GLUTEAL AREA was prepped with BETADINEin a sterile fashion, and a sterile drape was applied covering the operative field. A sterile gown and sterile gloves were used for the procedure. Local  anesthesia was provided with 1% Lidocaine.  Previous imaging reviewed. Patient positioned prone. Noncontrast localization CT performed. The posterior pelvic fluid collection was localized. Under sterile conditions and local anesthesia, an 18 gauge 15 cm access needle was advanced from a medial trans gluteal approach into the collection. Needle position confirmed with CT. Guidewire advanced. Tract dilatation performed to advance a 10 [place] drain. Retention loop formed in the collection. Syringe aspiration yielded 40 cc serosanguineous fluid. Sample sent for Gram stain and culture. Catheter secured with a Prolene suture and connected to external suction bulb. Sterile dressing applied.  COMPLICATIONS: None immediate  FINDINGS: Imaging confirms needle access into the pelvic fluid collection for drain insertion  IMPRESSION: Successful CT-guided pelvic abscess drain insertion   Electronically Signed   By: Judie Petit.  Shick M.D.   On: 07/10/2014 17:07     ASSESSMENT / PLAN: ETT 3/31 >> 4/02  PULMONARY A: Acute hypoxic respiratory failure 2nd to H1N1, hypervolemia, HCAP. Hx of COPD. Atelectasis. Night time hypoxia concern for OHS or OSA will add prn CPAP qhs P: Oxygen to keep SpO2 > 92% Bronchial hygiene Negative fluid balance as tolerated Scheduled BD's CPAP qhs as tolerated  CARDIAC A: Chronic dCHF - EF 60-65%, grade 1 diastolic dysfunction. SVT runs 4/04>>transitioned to neo (pt good EF 55-60%) Hypotensive. P: Wean off solu cortef  GASTROENTEROLOGY A: Pyloric ulcer perforation s/p laparotomy P: Post op care per [facility] TPN per [facility] Continue protonix Prolonged ileus with no flatus or BM, Pelvic drain per IR  RENAL: A: ESRD P: CVVH beginning to pull fluid  HEMATOLOGY A: Anemia of critical illness and chronic disease. Thrombocytopenia. Pltlt 42. HIT only slightly + but serotonin assay negative, therefore will hold off argatroban at this time could be component of Abx toxicity or ongoing  sepsis  P: F/u CBC SCD's Hgb>7 goal   INFECTION A: Peritonitis 2nd to perforated pyloric ulcer >> f/u CT 4/08 concern for abscess. HCAP with CXR improving IR for aspiration/drain of pelvic fluid collection suspected abscess P: Day 12 of Abx, currently on zosyn, vancomycin, diflucan  3/30 anaerobic>>rare lactobacillus  4/08 Pelvic fluid >>   ENDOCRINE A: DM type II. P: SSI  NEUROLOGY A: ACA CVA with Lt sided weakness. Deconditioning. P: Off ASA due to thrombocytopenia PT/OT when able   CC time is 35 minutes.  Coralyn Helling, MD [facility] Pulmonary/Critical Care 07/12/2014, 9:00 AM Pager:  [phone] After 3pm  call: [phone]

## 2014-07-12 NOTE — Progress Notes (Signed)
[name], female   DOB: [birth year], 59 y.o.   MRN: [id number] 11 Days Post-Op  Subjective: Pt continues on HD.  No flatus or BM.  Denies n/v.  Objective: Vital signs in last 24 hours: Temp:  [97.3 F (36.3 C)-97.7 F (36.5 C)] 97.7 F (36.5 C) (04/10 0800) Resp:  [13-25] 15 (04/10 0830) BP: (101-151)/(65-119) 109/88 mmHg (04/10 0830) SpO2:  [91 %-100 %] 99 % (04/10 0800) Weight:  [93.9 kg (207 lb 0.2 oz)] 93.9 kg (207 lb 0.2 oz) (04/10 0500) Last BM Date: 06/28/14  Intake/Output from previous day: 04/09 0701 - 04/10 0700 In: 3736 [I.V.:246; IV Piggyback:600; TPN:2880] Out: 5616 [Emesis/NG output:375; Drains:100] Intake/Output this shift: Total I/O In: 151.5 [I.V.:10; IV Piggyback:21.5; TPN:120] Out: 266 [Other:266]  Gen:  A&O x3 Incision/Wound: NGT still bilious Abd s, mildly distended, non tender  Lab Results:   Recent Labs  07/11/14 0557 07/12/14 0430  WBC 23.0* 20.6*  HGB 7.1* 7.0*  HCT 21.7* 21.7*  PLT 130* 108*   BMET  Recent Labs  07/11/14 1640 07/12/14 0430  NA 129* 129*  K 3.7 3.6  CL 94* 96  CO2 23 25  GLUCOSE 193* 195*  BUN 38* 42*  CREATININE 1.58* 1.70*  CALCIUM 8.1* 8.0*   PT/INR  Recent Labs  07/10/14 0715  LABPROT 15.4*  INR 1.21   ABG No results for input(s): PHART, HCO3 in the last 72 hours.  Invalid input(s): PCO2, PO2  Studies/Results: Ct Image Guided Fluid Drain By Catheter  07/10/2014   CLINICAL DATA:  Postop pelvic fluid collection concerning for abscess. sepsis despite broad-spectrum antibiotics  EXAM: CT GUIDED DRAINAGE OF POSTERIOR PELVIC ABSCESS ABSCESS  ANESTHESIA/SEDATION: 1.0 Mg IV Versed 50 mcg IV Fentanyl  Total Moderate Sedation Time:  15 minutes  PROCEDURE: The procedure, risks, benefits, and alternatives were explained to the patient. Questions regarding the procedure were encouraged and answered. The patient understands and consents to the procedure.  The RIGHT TRANS GLUTEAL AREA was prepped with  BETADINEin a sterile fashion, and a sterile drape was applied covering the operative field. A sterile gown and sterile gloves were used for the procedure. Local anesthesia was provided with 1% Lidocaine.  Previous imaging reviewed. Patient positioned prone. Noncontrast localization CT performed. The posterior pelvic fluid collection was localized. Under sterile conditions and local anesthesia, an 18 gauge 15 cm access needle was advanced from a medial trans gluteal approach into the collection. Needle position confirmed with CT. Guidewire advanced. Tract dilatation performed to advance a 10 [place] drain. Retention loop formed in the collection. Syringe aspiration yielded 40 cc serosanguineous fluid. Sample sent for Gram stain and culture. Catheter secured with a Prolene suture and connected to external suction bulb. Sterile dressing applied.  COMPLICATIONS: None immediate  FINDINGS: Imaging confirms needle access into the pelvic fluid collection for drain insertion  IMPRESSION: Successful CT-guided pelvic abscess drain insertion   Electronically Signed   By: Judie Petit.  Shick M.D.   On: 07/10/2014 17:07    Anti-infectives: Anti-infectives    Start     Dose/Rate Route Frequency Ordered Stop   07/07/14 1200  vancomycin (VANCOCIN) IVPB 1000 mg/200 mL premix     1,000 mg 200 mL/hr over 60 Minutes Intravenous Every 24 hours 07/07/14 0937     07/06/14 2000  fluconazole (DIFLUCAN) IVPB 400 mg     400 mg 100 mL/hr over 120 Minutes Intravenous Every 24 hours 07/06/14 1507     07/06/14 1800  piperacillin-tazobactam (ZOSYN) IVPB 2.25  g     2.25 g 100 mL/hr over 30 Minutes Intravenous 4 times per day 07/06/14 1505     07/05/14 2000  fluconazole (DIFLUCAN) IVPB 200 mg  Status:  Discontinued     200 mg 100 mL/hr over 60 Minutes Intravenous Every 24 hours 07/05/14 0757 07/06/14 1507   07/05/14 1400  piperacillin-tazobactam (ZOSYN) IVPB 2.25 g  Status:  Discontinued     2.25 g 100 mL/hr over 30 Minutes Intravenous 3  times per day 07/05/14 0749 07/06/14 1505   07/02/14 1800  vancomycin (VANCOCIN) IVPB 1000 mg/200 mL premix  Status:  Discontinued     1,000 mg 200 mL/hr over 60 Minutes Intravenous Every 24 hours 07/01/14 1858 07/05/14 0801   07/01/14 2200  piperacillin-tazobactam (ZOSYN) IVPB 3.375 g  Status:  Discontinued     3.375 g 100 mL/hr over 30 Minutes Intravenous 4 times per day 07/01/14 1858 07/05/14 0749   07/01/14 2000  fluconazole (DIFLUCAN) IVPB 400 mg  Status:  Discontinued     400 mg 100 mL/hr over 120 Minutes Intravenous Every 24 hours 07/01/14 1858 07/05/14 0757   07/01/14 1400  fluconazole (DIFLUCAN) IVPB 200 mg  Status:  Discontinued     200 mg 100 mL/hr over 60 Minutes Intravenous Every 24 hours 07/01/14 1330 07/01/14 1853   07/01/14 1000  piperacillin-tazobactam (ZOSYN) IVPB 2.25 g  Status:  Discontinued     2.25 g 100 mL/hr over 30 Minutes Intravenous Every 8 hours 07/01/14 0952 07/01/14 1853   07/01/14 1000  vancomycin (VANCOCIN) 500 mg in sodium chloride 0.9 % 100 mL IVPB     500 mg 100 mL/hr over 60 Minutes Intravenous  Once 07/01/14 0952 07/01/14 1126   06/30/14 1200  vancomycin (VANCOCIN) IVPB 1000 mg/200 mL premix     1,000 mg 200 mL/hr over 60 Minutes Intravenous  Once 06/30/14 0944 06/30/14 1403   06/30/14 1200  ceFEPIme (MAXIPIME) 2 g in dextrose 5 % 50 mL IVPB     2 g 100 mL/hr over 30 Minutes Intravenous  Once 06/30/14 0944 06/30/14 1425   06/30/14 0100  vancomycin (VANCOCIN) 2,000 mg in sodium chloride 0.9 % 500 mL IVPB     2,000 mg 250 mL/hr over 120 Minutes Intravenous  Once 06/30/14 0048 06/30/14 0525   06/30/14 0100  ceFEPIme (MAXIPIME) 2 g in dextrose 5 % 50 mL IVPB     2 g 100 mL/hr over 30 Minutes Intravenous  Once 06/30/14 0048 06/30/14 0322   06/27/14 0600  cefUROXime (ZINACEF) 1.5 g in dextrose 5 % 50 mL IVPB     1.5 g 100 mL/hr over 30 Minutes Intravenous On call to O.R. 06/26/14 1019 06/27/14 0630   06/19/14 1800  oseltamivir (TAMIFLU) capsule 30 mg      30 mg Oral Every M-W-F (1800) 06/19/14 1003 06/22/14 1841   06/18/14 1600  oseltamivir (TAMIFLU) capsule 30 mg  Status:  Discontinued     30 mg Oral Daily 06/18/14 1538 06/19/14 1003      Assessment/Plan: s/p Procedure(s): EXPLORATORY LAPAROTOMY WITH CLOSURE OF PERFORATED PYLORIC ULCER (N/A) SMALL BOWEL RESECTION (N/A) . EXPLORATORY LAPAROTOMY WITH CLOSURE OF PERFORATED PYLORIC ULCER, SMALL BOWEL RESECTION - 07/01/2014 [name] Diflucan/Zosyn/Vanc Has NGT - no BM ileus continues.  Await return of bowel function 1B. Open wound - clean -  1C. Pelvic fluid collection perc drain placed  2. Leukocytosis WBC - continues to improve 3. Anemia - 7.3 - continues to drift 4. ESRD - Creat - improving On  CVVH 5. Nutrition - on TPN, continue 6. Stroke - 06/19/2014 Left hemiplegia - this will probably limit her mobility and return to normal activities 7. Activity - PT ordered 8. On Solucortef - Just started 4/7 - discussed with resident  Principal Problem:  Acute respiratory failure with hypoxia Active Problems:  Cerebral thrombosis with cerebral infarction  Hypertension  ESRD needing dialysis  Obesity (BMI 30-39.9)  Depression  Left renal mass  Chronic diastolic heart failure  H1N1 influenza  Tobacco use disorder  Renal failure (ARF), acute on chronic  Hyperlipidemia  PSVT (paroxysmal supraventricular tachycardia)  Hypoxia  SOB (shortness of breath)  Perforated gastric ulcer   LOS: 25 days    [name] 07/12/2014

## 2014-07-12 NOTE — Progress Notes (Signed)
eLink Physician-Brief Progress Note Patient Name: [name] DOB: 11-Oct-1955 MRN: [id number]   Date of Service  07/12/2014  HPI/Events of Note  Hypophosphatemia  eICU Interventions  Phos replaced     Intervention Category Intermediate Interventions: Electrolyte abnormality - evaluation and management  DETERDING,ELIZABETH 07/12/2014, 6:15 AM

## 2014-07-12 NOTE — Progress Notes (Signed)
[facility] Progress Note   Subjective: BP's stable, -1800 last 24 hours on CRRT, pt w/o complaints  Filed Vitals:   07/12/14 0800 07/12/14 0830 07/12/14 0900 07/12/14 0930  BP: 121/88 109/88 132/80 119/78  Pulse:      Temp: 97.7 F (36.5 C)     TempSrc: Axillary     Resp: 15 15 15 16   Height:      Weight:      SpO2: 99%      Exam: Alert, no distress, NG in place No jvd Chest clear bilat RRR no MRG Abd less distended, large dressing in place, drain R abd, dec'd BS Improving edema dependent areas bilat , now 1+ Neuro is nf, Ox 3  HD: new start Max inpt weight 106kg, minimum 94.1kg        Assessment: 1. Perforated pyloric ulcer / necrotic small bowel - s/p repair of perf ulcer w partial ileum resection 3/30 2. Recurrent sepsis - improving s/p perc drainage of pelvic fluid collection; on vanc/ zosyn/ diflucan 3. S/P acute ACA stroke / left hemiparesis 4. Vol excess - improving 5. Nutrition / ileus - remains on TNA, hypokalemic 6. ESRD new start to HD; on CRRT 7. Anemia ESA started, IV Fe 8. 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  No binders d/t low phos 9. S/P influenza H1N1 10. 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 HD access - Permcath placed 3/26 d/t AVF problems; when stable VVS wants to considering revision of AVF  Plan - cont CRRT, UF as tolerated, change dialysate bath to 5k to get K up      Vinson Moselleob Khalessi Blough MD  pager [phone]    cell [phone]  07/12/2014, 9:59 AM     Recent Labs Lab 07/11/14 0557 07/11/14 1640 07/12/14 0430  NA 129* 129* 129*  K 3.7 3.7 3.6  CL 97 94* 96  CO2 29 23 25   GLUCOSE 211* 193* 195*  BUN 39* 38* 42*  CREATININE 1.74* 1.58* 1.70*  CALCIUM 8.0* 8.1* 8.0*  PHOS 3.0 2.3 2.0*    Recent Labs Lab 07/06/14 0552  07/09/14 0430  07/11/14 0557 07/11/14 1640 07/12/14 0430  AST 25  --  17  --   --   --   --   ALT 69*  --  29  --   --   --   --   ALKPHOS 75  --  111  --   --   --   --   BILITOT 1.9*  --  2.0*  --   --   --   --    PROT 4.5*  --  4.9*  --   --   --   --   ALBUMIN 1.4*  < > 1.2*  < > 1.6* 1.6* 1.6*  < > = values in this interval not displayed.  Recent Labs Lab 07/10/14 0415 07/11/14 0557 07/12/14 0430  WBC 26.4* 23.0* 20.6*  NEUTROABS 24.3* 20.7* 18.8*  HGB 7.3* 7.1* 7.0*  HCT 22.6* 21.7* 21.7*  MCV 81.0 83.1 84.8  PLT 40* 130* 108*   . sodium chloride   Intravenous Once  . chlorhexidine  15 mL Mouth Rinse BID  . darbepoetin (ARANESP) injection - DIALYSIS  150 mcg Intravenous Q Sat-HD  . fluconazole (DIFLUCAN) IV  400 mg Intravenous Q24H  . hydrocortisone sodium succinate  50 mg Intravenous Q12H  . insulin aspart  0-15 Units Subcutaneous 6 times per day  . ipratropium  0.5 mg Nebulization Q6H  .  levalbuterol  0.63 mg Nebulization Q6H  . pantoprazole (PROTONIX) IV  40 mg Intravenous Q12H  . piperacillin-tazobactam (ZOSYN)  IV  2.25 g Intravenous 4 times per day  . potassium chloride  10 mEq Intravenous Q1 Hr x 4  . sodium phosphate  Dextrose 5% IVPB  30 mmol Intravenous Once  . vancomycin  1,000 mg Intravenous Q24H   . sodium chloride 10 mL/hr at 07/10/14 0700  . TPN (CLINIMIX) Adult without lytes 110 mL/hr at 07/11/14 1743   And  . fat emulsion 240 mL (07/11/14 1743)  . dialysis replacement fluid (prismasate) 400 mL/hr at 07/11/14 2232  . dialysis replacement fluid (prismasate) 200 mL/hr at 07/11/14 1052  . dialysate (PRISMASATE)     albuterol, anticoagulant sodium citrate, fentaNYL, metoprolol, morphine injection, ondansetron (ZOFRAN) IV, sodium chloride

## 2014-07-12 NOTE — Progress Notes (Signed)
PARENTERAL NUTRITION CONSULT NOTE - Follow-up  Pharmacy Consult for TPN Indication: prolonged ileus  No Known Allergies  Patient Measurements: Height:  (165.1 cm) Weight: 207 lb 0.2 oz (93.9 kg) IBW/kg (Calculated) : 57  Adjusted body weight: 67  Vital Signs: Temp: 97.7 F (36.5 C) (04/10 0800) Temp Source: Axillary (04/10 0800) BP: 128/78 mmHg (04/10 1030) Intake/Output from previous day: 04/09 0701 - 04/10 0700 In: 3736 [I.V.:246; IV Piggyback:600; TPN:2880] Out: 5616 [Emesis/NG output:375; Drains:100] Intake/Output from this shift: Total I/O In: 497.5 [I.V.:30; IV Piggyback:107.5; TPN:360] Out: 805 [Other:805]  Labs:  Recent Labs  07/10/14 0415 07/10/14 0715 07/11/14 0557 07/12/14 0430  WBC 26.4*  --  23.0* 20.6*  HGB 7.3*  --  7.1* 7.0*  HCT 22.6*  --  21.7* 21.7*  PLT 40*  --  130* 108*  APTT  --  37  --   --   INR  --  1.21  --   --      Recent Labs  07/10/14 0415  07/11/14 0557 07/11/14 1640 07/12/14 0430  NA 129*  < > 129* 129* 129*  K 4.0  < > 3.7 3.7 3.6  CL 97  < > 97 94* 96  CO2 23  < > GLUCOSE 185*  < > 211* 193* 195*  BUN 38*  < > 39* 38* 42*  CREATININE 1.99*  < > 1.74* 1.58* 1.70*  CALCIUM 7.6*  < > 8.0* 8.1* 8.0*  MG 2.2  --  2.2  --  2.2  PHOS 2.2*  < > 3.0 2.3 2.0*  ALBUMIN 1.3*  < > 1.6* 1.6* 1.6*  < > = values in this interval not displayed. Estimated Creatinine Clearance: 40.9 mL/min (by C-G formula based on Cr of 1.7).    Recent Labs  07/12/14 0027 07/12/14 0401 07/12/14 0725  GLUCAP 112* 125* 117*    Insulin Requirements in the past 24 hours:  4 units Novolog moderate SSI;  40 units regular insulin in TPN;  solucortef decreased to q12h  Current Nutrition:  NPO Clinimix to 5/15 (w/o electrolytes) at 110 ml/hr and IVFE 20% at 26ml/hr. This will provide ~2350 kcal and 132g of protein which will meet 100% of calorie and protein goals  Nutritional Goals: per RD 4/4 2200-2600 kCal, 120-140 grams of  protein per day (maximize protein)  Assessment: 59 yo F with complicated hospital course. S/p closure perforated pyloric ulcer, SBR for ischemic necrosis, diffuse peritonitis 3/30. Pharmacy consulted to provide TPN for nutrition support.   GI: NPO, bowel rest. No flatus or BM, ileus continues. TPN providing 100 % support. NG o/p 375 ml/24h. Drains o/p 100. Albumin 1.3. Prealbumin from 4/1 is 5. 4/4 prealbumin is up to 10. 4/8: placed pelvic abscess drain, 40 cc aspirated   Endo: CBGs much improved after insulin increased to 40 units in TPN.  Now solucortef decreased from q6h to q12h.   goal of < 180 in ICU pt.   Lytes: Na at 129,  K 3.6  (goal >4 with ileus), Mg 2.2 (goal >2 with ileus), Phos  2.0, MD gave 30 mM na phos and renal to add K to dialysate of CRRT and  Ordered 4 runs of K  Renal: ESRD, new start HD this admission. CRRT restarted on 4/4. MIVF: NS @ 51ml/hr. Plan to continue CRRT, to keep even.   Pulm: Pt extubated 4/2. 4L [place]  Cards: off pressors,   Hepatotobil: LFTswnl. TG 88.  Neuro: New stroke  noted on 3/18.   ID: Day # 12 of vanc/zosyn/diflucan for peritonitis. S/p OR for perforated pyloric ulcer, evacuation of ascites and small bowel resection for ischemic necrosis. WBC 20.6, Afeb. 4/8 placed perc drain of pelvic abscess  Vancomycin 3/29>  VR 4/3 - 38.5 Cefepime 3/29>3/30 Zosyn 3/30> 3/20 fluconazole>  3/30 peritoneal fluid- rare lactobacillus (no sensitivies performed; usually sensitive to PCN) 3/28 blood x2 - ngtd 3/36 c diff - neg  Best Practices: Lovenox, PPI IV  TPN Access: CVC  TPN day#: 10  Plan:  - continue Clinimix 5/15 (w/o electrolytes) at 110 ml/hr and IVFE 20% at 8010ml/hr. This will provide ~2350 kcal and 132g of protein which will meet 100% of calorie and protein goals - Continue moderate SSI and decrease insulin to 25 units regular insulin in TPN now that solucortef is being tapered -30mM na phos, 4 runs of K per MD as well as K added to  dialysate of CRRT - Standard MVI and trace elements daily in TPN - F/U am labs  Herby AbrahamMichelle T. Britne Borelli, Pharm.D. [phone] 07/12/2014 10:41 AM

## 2014-07-13 ENCOUNTER — Inpatient Hospital Stay (HOSPITAL_COMMUNITY): Payer: Medicaid Other

## 2014-07-13 DIAGNOSIS — K559 Vascular disorder of intestine, unspecified: Secondary | ICD-10-CM

## 2014-07-13 LAB — RENAL FUNCTION PANEL
ANION GAP: 9 (ref 5–15)
Albumin: 1.8 g/dL — ABNORMAL LOW (ref 3.5–5.2)
BUN: 42 mg/dL — ABNORMAL HIGH (ref 6–23)
CALCIUM: 7.9 mg/dL — AB (ref 8.4–10.5)
CO2: 23 mmol/L (ref 19–32)
CREATININE: 1.75 mg/dL — AB (ref 0.50–1.10)
Chloride: 98 mmol/L (ref 96–112)
GFR calc Af Amer: 36 mL/min — ABNORMAL LOW (ref >90)
GFR, EST NON AFRICAN AMERICAN: 31 mL/min — AB (ref >90)
GLUCOSE: 191 mg/dL — AB (ref 70–99)
Phosphorus: 3.2 mg/dL (ref 2.3–4.6)
Potassium: 3.9 mmol/L (ref 3.5–5.1)
Sodium: 130 mmol/L — ABNORMAL LOW (ref 135–145)

## 2014-07-13 LAB — CBC WITH DIFFERENTIAL/PLATELET
BASOS PCT: 0 % (ref 0–1)
Basophils Absolute: 0 10*3/uL (ref 0.0–0.1)
EOS PCT: 0 % (ref 0–5)
Eosinophils Absolute: 0 10*3/uL (ref 0.0–0.7)
HEMATOCRIT: 22.8 % — AB (ref 36.0–46.0)
Hemoglobin: 7.1 g/dL — ABNORMAL LOW (ref 12.0–15.0)
Lymphocytes Relative: 7 % — ABNORMAL LOW (ref 12–46)
Lymphs Abs: 3.3 10*3/uL (ref 0.7–4.0)
MCH: 26.5 pg (ref 26.0–34.0)
MCHC: 31.1 g/dL (ref 30.0–36.0)
MCV: 85.1 fL (ref 78.0–100.0)
MONO ABS: 4.2 10*3/uL — AB (ref 0.1–1.0)
MONOS PCT: 9 % (ref 3–12)
NEUTROS PCT: 84 % — AB (ref 43–77)
Neutro Abs: 39.2 10*3/uL — ABNORMAL HIGH (ref 1.7–7.7)
Platelets: 97 10*3/uL — ABNORMAL LOW (ref 150–400)
RBC: 2.68 MIL/uL — ABNORMAL LOW (ref 3.87–5.11)
RDW: 21.8 % — ABNORMAL HIGH (ref 11.5–15.5)
WBC: 46.7 10*3/uL — ABNORMAL HIGH (ref 4.0–10.5)

## 2014-07-13 LAB — COMPREHENSIVE METABOLIC PANEL
ALT: 55 U/L — AB (ref 0–35)
AST: 60 U/L — ABNORMAL HIGH (ref 0–37)
Albumin: 1.7 g/dL — ABNORMAL LOW (ref 3.5–5.2)
Alkaline Phosphatase: 155 U/L — ABNORMAL HIGH (ref 39–117)
Anion gap: 5 (ref 5–15)
BUN: 44 mg/dL — ABNORMAL HIGH (ref 6–23)
CALCIUM: 8 mg/dL — AB (ref 8.4–10.5)
CO2: 26 mmol/L (ref 19–32)
CREATININE: 1.81 mg/dL — AB (ref 0.50–1.10)
Chloride: 100 mmol/L (ref 96–112)
GFR calc Af Amer: 34 mL/min — ABNORMAL LOW (ref >90)
GFR, EST NON AFRICAN AMERICAN: 30 mL/min — AB (ref >90)
Glucose, Bld: 133 mg/dL — ABNORMAL HIGH (ref 70–99)
Potassium: 4 mmol/L (ref 3.5–5.1)
Sodium: 131 mmol/L — ABNORMAL LOW (ref 135–145)
Total Bilirubin: 1.9 mg/dL — ABNORMAL HIGH (ref 0.3–1.2)
Total Protein: 5.8 g/dL — ABNORMAL LOW (ref 6.0–8.3)

## 2014-07-13 LAB — GLUCOSE, CAPILLARY
GLUCOSE-CAPILLARY: 125 mg/dL — AB (ref 70–99)
Glucose-Capillary: 145 mg/dL — ABNORMAL HIGH (ref 70–99)
Glucose-Capillary: 159 mg/dL — ABNORMAL HIGH (ref 70–99)
Glucose-Capillary: 174 mg/dL — ABNORMAL HIGH (ref 70–99)
Glucose-Capillary: 187 mg/dL — ABNORMAL HIGH (ref 70–99)

## 2014-07-13 LAB — VANCOMYCIN, TROUGH: Vancomycin Tr: 29.4 ug/mL (ref 10.0–20.0)

## 2014-07-13 LAB — MAGNESIUM: MAGNESIUM: 2.1 mg/dL (ref 1.5–2.5)

## 2014-07-13 LAB — VANCOMYCIN, RANDOM: Vancomycin Rm: 32 ug/mL

## 2014-07-13 LAB — PHOSPHORUS: Phosphorus: 1.7 mg/dL — ABNORMAL LOW (ref 2.3–4.6)

## 2014-07-13 MED ORDER — PIPERACILLIN-TAZOBACTAM IN DEX 2-0.25 GM/50ML IV SOLN
2.2500 g | Freq: Three times a day (TID) | INTRAVENOUS | Status: DC
  Administered 2014-07-13 – 2014-07-23 (×29): 2.25 g via INTRAVENOUS
  Filled 2014-07-13 (×36): qty 50

## 2014-07-13 MED ORDER — METOPROLOL TARTRATE 1 MG/ML IV SOLN
2.5000 mg | Freq: Four times a day (QID) | INTRAVENOUS | Status: DC
  Administered 2014-07-13 – 2014-07-16 (×13): 2.5 mg via INTRAVENOUS
  Filled 2014-07-13 (×16): qty 5

## 2014-07-13 MED ORDER — FAT EMULSION 20 % IV EMUL
240.0000 mL | INTRAVENOUS | Status: AC
  Administered 2014-07-13: 240 mL via INTRAVENOUS
  Filled 2014-07-13: qty 250

## 2014-07-13 MED ORDER — HYDROCORTISONE NA SUCCINATE PF 100 MG IJ SOLR
50.0000 mg | Freq: Every day | INTRAMUSCULAR | Status: DC
  Administered 2014-07-14 – 2014-07-17 (×4): 50 mg via INTRAVENOUS
  Filled 2014-07-13 (×5): qty 1

## 2014-07-13 MED ORDER — SODIUM PHOSPHATE 3 MMOLE/ML IV SOLN
25.0000 mmol | Freq: Once | INTRAVENOUS | Status: AC
  Administered 2014-07-13: 25 mmol via INTRAVENOUS
  Filled 2014-07-13: qty 8.33

## 2014-07-13 MED ORDER — FLUCONAZOLE IN SODIUM CHLORIDE 200-0.9 MG/100ML-% IV SOLN
200.0000 mg | INTRAVENOUS | Status: DC
  Administered 2014-07-13 – 2014-07-18 (×6): 200 mg via INTRAVENOUS
  Filled 2014-07-13 (×8): qty 100

## 2014-07-13 MED ORDER — ANTICOAGULANT SODIUM CITRATE 4% (200MG/5ML) IV SOLN
5.0000 mL | Freq: Once | Status: AC
  Administered 2014-07-13: 4.2 mL via INTRAVENOUS
  Filled 2014-07-13: qty 250

## 2014-07-13 MED ORDER — TRACE MINERALS CR-CU-F-FE-I-MN-MO-SE-ZN IV SOLN
INTRAVENOUS | Status: AC
  Administered 2014-07-13: 18:00:00 via INTRAVENOUS
  Filled 2014-07-13: qty 1992

## 2014-07-13 NOTE — Progress Notes (Signed)
Name: [name] MRN: [id number] DOB: Jul 23, 1955    ADMISSION DATE:  06/17/2014 CONSULTATION DATE:  3/30  REFERRING MD :  Mahala MenghiniSamtani   CHIEF COMPLAINT:  Hypoxia   BRIEF PATIENT DESCRIPTION: 59 yo female smoker with hx HTN, ESRD on HD (just began this admit), COPD, failed L AV fistula, initially admitted 3/16 with flu, acute hypoxic resp failure.  [facility] parameters and need for HD.  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  Has made little improvement and on 3/30 has worsening hypoxia, generalized pain, hyperkalemia, worsening leukocytosis and PCCM consulted.     SIGNIFICANT EVENTS  3/16 influenza A+ 3/18 R ACA stroke 3/31 to or ex lap for per pyloric ulcer and sm bowel resection 4/1- septic shock on pressors 4/3-pressors weaned off  4/4- restart pressors, CRRT 4/5- run of SVTs 4/6- off levo switched to neo 4/7- pelvic abscess on CT 4/8 - IR placed pelvic drain 4/9 - off pressors 4/11 - long run of SVT  STUDIES:  3/19 MRI: multifocal acute infarction of both hemispheres, large right distal ACA 3/22 TEE: severe RAE, severely reduced RV function, severe TR, no LAA thrombus, small PFO on TEE 3/30 CT chest/abd: perf bowel 2/2 presumed embolism in setting of ACA, ?rectrovaginal abscess development, LLL collapse 4/4 CT abd/pelvis: small pleural effusion left, no leaking contrast or SBO findings  4/7 CT abd/pelvis: increased pelvic abscess growth   SUBJECTIVE:  Still no BM or flatus. Feeling slightly improved this AM. Frustrated by the inability to eat.   VITAL SIGNS: Temp:  [97.4 F (36.3 C)-97.8 F (36.6 C)] 97.5 F (36.4 C) (04/11 0400) Resp:  [15-21] 17 (04/11 0700) BP: (97-140)/(50-89) 129/72 mmHg (04/11 0700) SpO2:  [92 %-100 %] 99 % (04/11 0700) Weight:  [199 lb 15.3 oz (90.7 kg)] 199 lb 15.3 oz (90.7 kg) (04/11 0500)  PHYSICAL EXAMINATION: General: resting in bed, upset, crying HEENT: PERRL, EOMI, no  scleral icterus Cardiac: RRR, no rubs, murmurs or gallops Pulm: clear to auscultation bilaterally, moving normal volumes of air Abd: soft, nondistended, BS hypoactive, minimal pain Ext: warm and well perfused, no pedal edema Neuro: alert and oriented X3, follows commands   CBC Recent Labs     07/11/14  0557  07/12/14  0430  07/13/14  0440  WBC  23.0*  20.6*  46.7*  HGB  7.1*  7.0*  7.1*  HCT  21.7*  21.7*  22.8*  PLT  130*  108*  97*    Coag's No results for input(s): APTT, INR in the last 72 hours.  BMET Recent Labs     07/12/14  0430  07/12/14  1615  07/13/14  0440  NA  129*  128*  131*  K  3.6  4.1  4.0  CL  96  96  100  CO2  25  22  26   BUN  42*  39*  44*  CREATININE  1.70*  1.58*  1.81*  GLUCOSE  195*  140*  133*    Electrolytes Recent Labs     07/11/14  0557   07/12/14  0430  07/12/14  1615  07/13/14  0440  CALCIUM  8.0*   < >  8.0*  7.8*  8.0*  MG  2.2   --   2.2   --   2.1  PHOS  3.0   < >  2.0*  2.8  1.7*   < > =  values in this interval not displayed.    Liver Enzymes Recent Labs     07/12/14  0430  07/12/14  1615  07/13/14  0440  AST   --    --   60*  ALT   --    --   55*  ALKPHOS   --    --   155*  BILITOT   --    --   1.9*  ALBUMIN  1.6*  1.7*  1.7*   Glucose Recent Labs     07/12/14  0725  07/12/14  1149  07/12/14  1620  07/12/14  1958  07/13/14  0018  07/13/14  0443  GLUCAP  117*  140*  114*  151*  145*  125*    Imaging Dg Chest Port 1 View  07/12/2014   CLINICAL DATA:  End-stage renal disease.  Pulmonary edema.  EXAM: PORTABLE CHEST - 1 VIEW  COMPARISON:  07/09/2014  FINDINGS: Unchanged position of dialysis catheter and left IJ catheter. There is a nasogastric tube with tip in the stomach. Stable cardiac enlargement. Vascular congestion is unchanged. The tip is in the projection of the aortic arch.  IMPRESSION: 1. Stable vascular congestion.   Electronically Signed   By: Signa Kell M.D.   On: 07/12/2014 10:53    ASSESSMENT / PLAN: ETT 3/31 >> 4/02  PULMONARY A: Acute hypoxic respiratory failure 2nd to H1N1, hypervolemia, HCAP. Hx of COPD. Atelectasis. Night time hypoxia concern for OHS or OSA will add prn CPAP qhs P: Oxygen to keep SpO2 > 92% Bronchial hygiene Negative fluid balance as tolerated Scheduled BD's CPAP qhs as tolerated  CARDIAC A: Chronic dCHF - EF 60-65%, grade 1 diastolic dysfunction. SVT runs 4/04>>transitioned to neo (pt good EF 55-60%) Hypotensive. P: Wean off solu cortef (will change to  qd)   GASTROENTEROLOGY A: Pyloric ulcer perforation s/p laparotomy P: Post op care per [facility]CS TPN per [facility]CS Continue protonix Prolonged ileus with no flatus or BM, Pelvic drain per IR  RENAL: A: ESRD P: CVVH beginning to pull fluid  HEMATOLOGY A: Anemia of critical illness and chronic disease. Leukocytosis. WBC 40 from 20 with left shift  Thrombocytopenia improving. Pltlt 97. HIT only slightly + but serotonin assay negative, therefore will hold off argatroban at this time could be component of Abx toxicity or ongoing sepsis  P: F/u CBC SCD's Hgb>7 goal   INFECTION A: Peritonitis 2nd to perforated pyloric ulcer >> f/u CT 4/08 concern for abscess. HCAP with CXR improving IR for aspiration/drain of pelvic fluid collection suspected abscess P: Day 13 of Abx, currently on zosyn, vancomycin, diflucan  3/30 anaerobic>>rare lactobacillus  4/08 Pelvic fluid >> WBC no organisms   ENDOCRINE A: DM type II. P: SSI  NEUROLOGY A: ACA CVA with Lt sided weakness. Deconditioning. P: Off ASA due to thrombocytopenia PT/OT when able  Summary: Pt with pelvic abscess s/p drain, prolonged ilieus after PUD perf and SB necrosis on TPN, continues on CVVH. May need to consider GOC conversation with family.     Christen Bame, MD  IM PGY-3 Pgr: [phone]  Attending:  I have seen and examined the patient with nurse practitioner/resident and agree with the note above.    Very complicated case, I have reviewed her records and consultants notes.  On my exam she tells me that she feels stronger today, lungs with some rhonchi bilaterally, abdomen with minimal bowel sounds, trace edema in legs  1) ESRD> continue HD per renal tolerating volume removal  2) SVT > recurrent, cards feels high risk for afib pre hospitalization, had another run this morning, start low dose metoprolol 3) embolic phenomena> source? 4) PFO 5) Acute embolic stroke> more awake, but will definitely need long term rehab 6) H1/N1> resolved 7) profound deconditioning 8) protein calorie malnutrition > continue TPN, await return of bowel sounds 9) leukocytosis 4/11 > uncertain etiology, check CXR, blood culture, may need repeat CT, may need to remove CVL, f/u cbc again in AM; monitor for diarrhea 10) Peritoneal abscess > continue IV antibiotics, f/u fluid culture 11) bowel ishcemia> from embolic phenomena  May need goals of care conversation this week  My cc time 45 minutes  Heber Wolfforth, MD [facility] Pager: [phone] Cell: [phone] If no response, call [phone]

## 2014-07-13 NOTE — Progress Notes (Signed)
Pt with recurring runs of heart rhythm fast, regular-rate 170's; BP stable throughout with pt asymptomatic/no complaints; fast rate lasts approximately 2-3 minutes and subsides with pt "bearing down"; CCM MD aware; will continue to closely monitor

## 2014-07-13 NOTE — Progress Notes (Signed)
UR Completed.  [phone]  

## 2014-07-13 NOTE — Progress Notes (Signed)
PARENTERAL NUTRITION CONSULT NOTE - FOLLOW UP  Pharmacy Consult:  TPN Indication:  Prolonged ileus  No Known Allergies  Patient Measurements: Height: 5\' 5"  (165.1 cm) Weight: 199 lb 15.3 oz (90.7 kg) IBW/kg (Calculated) : 57  Adjusted body weight: 67 kg  Vital Signs: Temp: 98.1 F (36.7 C) (04/11 0759) Temp Source: Axillary (04/11 0759) BP: 129/72 mmHg (04/11 0700) Intake/Output from previous day: 04/10 0701 - 04/11 0700 In: 4253 [I.V.:250; IV Piggyback:1108; TPN:2880] Out: 6747 [Emesis/NG output:300; Drains:40]  Labs:  Recent Labs  07/11/14 0557 07/12/14 0430 07/13/14 0440  WBC 23.0* 20.6* 46.7*  HGB 7.1* 7.0* 7.1*  HCT 21.7* 21.7* 22.8*  PLT 130* 108* 97*     Recent Labs  07/11/14 0557  07/12/14 0430 07/12/14 1615 07/13/14 0440  NA 129*  < > 129* 128* 131*  K 3.7  < > 3.6 4.1 4.0  CL 97  < > 96 96 100  CO2 29  < > 25 22 26   GLUCOSE 211*  < > 195* 140* 133*  BUN 39*  < > 42* 39* 44*  CREATININE 1.74*  < > 1.70* 1.58* 1.81*  CALCIUM 8.0*  < > 8.0* 7.8* 8.0*  MG 2.2  --  2.2  --  2.1  PHOS 3.0  < > 2.0* 2.8 1.7*  PROT  --   --   --   --  5.8*  ALBUMIN 1.6*  < > 1.6* 1.7* 1.7*  AST  --   --   --   --  60*  ALT  --   --   --   --  55*  ALKPHOS  --   --   --   --  155*  BILITOT  --   --   --   --  1.9*  < > = values in this interval not displayed. Estimated Creatinine Clearance: 37.7 mL/min (by C-G formula based on Cr of 1.81).    Recent Labs  07/12/14 1958 07/13/14 0018 07/13/14 0443  GLUCAP 151* 145* 125*     Insulin Requirements in the past 24 hours:  7 units moderate SSI + 25 units regular insulin in TPN  Assessment: 58 YOF with complicated hospital course. S/p closure perforated pyloric ulcer, SBR for ischemic necrosis, diffuse peritonitis on 07/01/14.  Pharmacy consulted to provide TPN for nutrition support.   GI: Ileus, on bowel rest.  Prealbumin from 4/1 is 5, increased to 10 on 4/4.  Continued NG and drain output.  +flatus >> checking  KUB, clamping NGT 4/8: placed pelvic abscess drain, 40 cc aspirated Endo: no hx DM - CBGs acceptable with SSI + insulin in TPN.  Tapering SM further Lytes: none in TPN while on CRRT - Na improved to 131, Phos 1.7 Renal: gout / ESRD - CRRT 4/4 >> 4/11, plan is to start iHD tomorrow - SCr up 1.81, BUN up to 44 Pulm: asthma - extubated 4/2, decreased to 2L [place] - Atrovent/Xopenex nebs Cards: HTN - VSS, CVP 6 Hepatotobil: LFTs/tbili trending up, TG WNL Neuro: hx depression / insomnia - new stroke noted on 3/18.  ID: Day #13 of vanc/zosyn/diflucan for peritonitis. S/p OR for perforated pyloric ulcer, evacuation of ascites and small bowel resection for ischemic necrosis.  Afebrile, WBC up 46.7 4/8 placed perc drain of pelvic abscess  Vanc 3/29 >> Cefepime 3/29 >> 3/30 Zosyn 3/30 >> Fluc 3/20 >>  3/30 peritoneal fluid - rare lactobacillus (no sensitivies performed; usually sensitive to PCN) 3/28 blood x2 - ngtd  3/36 C diff - neg  Best Practices: Lovenox, PPI IV TPN Access: CVC TPN day#: 11  Current Nutrition:  Clinimix 5/15 at 110 ml/hr and IVFE 20% at 10 ml/hr = 2350 kCal and 132g of protein per day, meeting 100% of patient's needs  Nutritional Goals: 2200-2600 kCal, 120-140 grams of protein per day   Plan:  - Decrease Clinimix 5/15 (no electrolytes) to 83 ml/hr and continue IVFE 20% at 10 ml/hr.  TPN will provide 1894 kCal and 100gm of protein per day, meeting 86% of minimal kCal and 83% of minimal protein needs.  Per discussion with Dr. Lowell Guitar, decreasing TPN rate to minimize fluid provision given plan to transition patient off of CRRT. - Continue moderate SSI and decrease insulin in TPN to 15 units now that Solu-Cortef is being tapered further and TPN rate is also reduced. - Daily multivitamin and trace elements in TPN - NaPhos 25 mmol IV x 1 - F/U AM labs, volume/respiratory status while off CRRT   Pranish Akhavan D. Laney Potash, PharmD, BCPS Pager:  [phone] 07/13/2014, 9:07 AM

## 2014-07-13 NOTE — Progress Notes (Signed)
Assessment: 1. Perforated pyloric ulcer / necrotic small bowel - s/p repair of perf ulcer w partial ileum resection 3/30 2. Recurrent sepsis - improving s/p perc drainage of pelvic fluid collection; on vanc/ zosyn/ diflucan 3. S/P acute ACA stroke / left hemiparesis 4. Vol excess - improving 5. Nutrition / ileus - remains on TNA, hypokalemic 6. ESRD new start to HD; on CRRT 7. Anemia ESA started, IV Fe, may need PRBCs 8. 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 No binders d/t low phos 9. S/P influenza H1N1 10. 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 HD access - Permcath placed 3/26 d/t AVF problems; when stable VVS wants to considering revision of AVF  Plan - Will convert to intermittent HD, ask pharmacy to maximally concentrate TNA, first HD in AM tuesday  Subjective: Interval History: Wants to eat  Objective: Vital signs in last 24 hours: Temp:  [97.4 F (36.3 C)-98.1 F (36.7 C)] 98.1 F (36.7 C) (04/11 0759) Resp:  [15-21] 17 (04/11 0700) BP: (97-140)/(50-89) 129/72 mmHg (04/11 0700) SpO2:  [92 %-100 %] 99 % (04/11 0700) Weight:  [90.7 kg (199 lb 15.3 oz)] 90.7 kg (199 lb 15.3 oz) (04/11 0500) Weight change: -3.2 kg (-7 lb 0.9 oz)  Intake/Output from previous day: 04/10 0701 - 04/11 0700 In: 4253 [I.V.:250; IV Piggyback:1108; TPN:2880] Out: 6747 [Emesis/NG output:300; Drains:40] Intake/Output this shift:    General appearance: alert and cooperative Resp: rhonchi Extremities: edema 1+ AVF LUE with thrill  Lab Results:  Recent Labs  07/12/14 0430 07/13/14 0440  WBC 20.6* 46.7*  HGB 7.0* 7.1*  HCT 21.7* 22.8*  PLT 108* 97*   BMET:  Recent Labs  07/12/14 1615 07/13/14 0440  NA 128* 131*  K 4.1 4.0  CL 96 100  CO2 22 26  GLUCOSE 140* 133*  BUN 39* 44*  CREATININE 1.58* 1.81*  CALCIUM 7.8* 8.0*   No results for input(s): PTH in the last 72 hours. Iron Studies: No results for input(s): IRON, TIBC, TRANSFERRIN, FERRITIN in the last 72 hours. Studies/Results: Dg Chest Port 1  View  07/12/2014   CLINICAL DATA:  End-stage renal disease.  Pulmonary edema.  EXAM: PORTABLE CHEST - 1 VIEW  COMPARISON:  07/09/2014  FINDINGS: Unchanged position of dialysis catheter and left IJ catheter. There is a nasogastric tube with tip in the stomach. Stable cardiac enlargement. Vascular congestion is unchanged. The tip is in the projection of the aortic arch.  IMPRESSION: 1. Stable vascular congestion.   Electronically Signed   By: Signa Kellaylor  [place] M.D.   On: 07/12/2014 10:53   Scheduled: . sodium chloride   Intravenous Once  . chlorhexidine  15 mL Mouth Rinse BID  . darbepoetin (ARANESP) injection - DIALYSIS  150 mcg Intravenous Q Sat-HD  . fluconazole (DIFLUCAN) IV  400 mg Intravenous Q24H  . hydrocortisone sodium succinate  50 mg Intravenous Daily  . insulin aspart  0-15 Units Subcutaneous 6 times per day  . ipratropium  0.5 mg Nebulization Q6H  . levalbuterol  0.63 mg Nebulization Q6H  . pantoprazole (PROTONIX) IV  40 mg Intravenous Q12H  . piperacillin-tazobactam (ZOSYN)  IV  2.25 g Intravenous 4 times per day  . vancomycin  1,000 mg Intravenous Q24H     LOS: 26 days   [name],Stephanie Pyle C 07/13/2014,8:37 AM

## 2014-07-13 NOTE — Progress Notes (Signed)
NUTRITION FOLLOW-UP   DOCUMENTATION CODES Per approved criteria  -Obesity Unspecified   INTERVENTION: Clinimix per Pharmacy, maximize protein Nutrition goals: 2200-2600 kcal and 120-140 grams protein per day  NUTRITION DIAGNOSIS: Inadequate oral intake  related to altered GI function as evidenced by NPO status, ongoing.   Goal: Pt to meet >/= 90% of their estimated nutrition needs, not met (meeting 83% of needs)   Monitor:  TPN, weight trends, labs, I/O's  ASSESSMENT: Pt with CKD and hypertension presents with of shortness of breath and generalized weakness x 2 weeks. Pt found to have metabolic acidosis from worsening uremia, H1N1 flu. Pt started on HD. 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  Pt transferred to ICU due to acute abdominal pain. Pt s/p exp lab, evacuation of ascites, closure of ulcer, 14 in resection of ileum due to perforated pyloric ulcer with diffuse peritonitis, ischemic necrosis of mid ileum.   Pt extubated 4/2.  Pt remains on CRRT with plans to transition to IHD 4/12.  Pharmacy asked to concentrate TPN for transition to IHD.   TPN: Clinimix E 5/15 @ 83 ml/hr with 20% lipids @ 10 ml/hr Provides: 1893 kcal (86% of needs) and 100 grams protein (83% of needs) Sodium and phosphorus low CBG's: 125-159  NGT:  4/10 300 ml 4/9  375 ml 4/8  230 ml  4/11 KUB: suggests adynamic colonic ileus  Height: Ht Readings from Last 1 Encounters:  06/26/14 _0  (1.651 m)    Weight: Wt Readings from Last 1 Encounters:  07/13/14 199 lb 15.3 oz (90.7 kg)  Usual weight: 220 lb (100 kg)  BMI:  Body mass index is 33.27 kg/(m^2). Class II obesity  Adjusted body weight: 84.8 kg  Estimated Nutritional Needs: Kcal: 2200-2600 Protein: 120-140 grams Fluid: 1.2 L/day  Skin: +1 LUE, LLE edema  Diet Order: Diet NPO time specified Except for: Ice Chips TPN (CLINIMIX) Adult without lytes TPN (CLINIMIX)  Adult without lytes   Intake/Output Summary (Last 24 hours) at 07/13/14 1106 Last data filed at 07/13/14 1100  Gross per 24 hour  Intake 3822.5 ml  Output   6563 ml  Net -2740.5 ml    Last BM: 3/27 x 2 (loose)  Labs:   Recent Labs Lab 07/11/14 0557  07/12/14 0430 07/12/14 1615 07/13/14 0440  NA 129*  < > 129* 128* 131*  K 3.7  < > 3.6 4.1 4.0  CL 97  < > 96 96 100  CO2 29  < > _1 BUN 39*  < > 42* 39* 44*  CREATININE 1.74*  < > 1.70* 1.58* 1.81*  CALCIUM 8.0*  < > 8.0* 7.8* 8.0*  MG 2.2  --  2.2  --  2.1  PHOS 3.0  < > 2.0* 2.8 1.7*  GLUCOSE 211*  < > 195* 140* 133*  < > = values in this interval not displayed.  CBG (last 3)   Recent Labs  07/13/14 0018 07/13/14 0443 07/13/14 0757  GLUCAP 145* 125* 159*    Scheduled Meds: . sodium chloride   Intravenous Once  . chlorhexidine  15 mL Mouth Rinse BID  . darbepoetin (ARANESP) injection - DIALYSIS  150 mcg Intravenous Q Sat-HD  . fluconazole (DIFLUCAN) IV  400 mg Intravenous Q24H  . hydrocortisone sodium succinate  50 mg Intravenous Daily  . insulin aspart  0-15 Units Subcutaneous 6 times per day  . ipratropium  0.5 mg Nebulization Q6H  . levalbuterol  0.63 mg Nebulization Q6H  . metoprolol  2.5 mg Intravenous 4 times per day  . pantoprazole (PROTONIX) IV  40 mg Intravenous Q12H  . piperacillin-tazobactam (ZOSYN)  IV  2.25 g Intravenous 4 times per day  . sodium phosphate  Dextrose 5% IVPB  25 mmol Intravenous Once  . vancomycin  1,000 mg Intravenous Q24H    Continuous Infusions: . sodium chloride 10 mL/hr at 07/13/14 0800  . TPN (CLINIMIX) Adult without lytes 110 mL/hr at 07/13/14 0800   And  . fat emulsion 10 mL/hr at 07/13/14 0800  . TPN (CLINIMIX) Adult without lytes     And  . fat emulsion    . dialysis replacement fluid (prismasate) 400 mL/hr at 07/13/14 0100  . dialysis replacement fluid (prismasate) 200 mL/hr at 07/12/14 1329  . dialysate (PRISMASATE) 1,500 mL/hr at 07/13/14 0900    [place], [place], CNSC [phone] Pager [phone] After Hours Pager

## 2014-07-13 NOTE — Progress Notes (Signed)
Referring Physician(s): CCS  Subjective:  Pelvic abscess drain placed 4/8 Intact and output good Pt seems some better  Allergies: Review of patient's allergies indicates no known allergies.  Medications: Prior to Admission medications   Medication Sig Start Date End Date Taking? Authorizing Provider  allopurinol (ZYLOPRIM) 100 MG tablet Take 100 mg by mouth 2 (two) times daily as needed (for gout).   Yes Historical Provider, MD  citalopram (CELEXA) 20 MG tablet Take 20 mg by mouth daily.   Yes Historical Provider, MD  furosemide (LASIX) 80 MG tablet Take 80 mg by mouth daily.   Yes Historical Provider, MD  hydrOXYzine (ATARAX/VISTARIL) 25 MG tablet Take 25 mg by mouth 3 (three) times daily as needed for anxiety.   Yes Historical Provider, MD  oxyCODONE (ROXICODONE) 5 MG immediate release tablet Take 1 tablet (5 mg total) by mouth every 6 (six) hours as needed for severe pain. Patient not taking: Reported on 06/17/2014 01/28/14   Raymond Gurney, PA-C     Vital Signs: BP 116/74 mmHg  Pulse 95  Temp(Src) 98.1 F (36.7 C) (Axillary)  Resp 17  Ht  (1.651 m)  Wt 90.7 kg (199 lb 15.3 oz)  BMI 33.27 kg/m2  SpO2 99%  Physical Exam  Skin:  Site of Rt buttock drain placement is NT; no bleeding Output serous color 40 cc yesterday 20 cc in JP No growth on Cx afeb Wbc wnl  Nursing note and vitals reviewed.   Imaging: Dg Abd 1 View  07/13/2014   CLINICAL DATA:  Ileus  EXAM: ABDOMEN - 1 VIEW  COMPARISON:  CT abdomen pelvis dated 07/09/2014  FINDINGS: Nonobstructive bowel gas pattern.  Contrast within distal small bowel in the right lower quadrant, ascending colon, and splenic flexure.  Pigtail drain in the right lower quadrant.  Enteric tube in the proximal gastric body.  IMPRESSION: Nonobstructive bowel gas pattern.  Residual contrast in the distal small bowel and colon, as above, unchanged from prior CT and suggesting adynamic colonic ileus.  Pigtail drain in the right  lower quadrant.   Electronically Signed   By: Charline Bills M.D.   On: 07/13/2014 12:48   Ct Abdomen Pelvis W Contrast  07/09/2014   CLINICAL DATA:  Ongoing sepsis. Evaluate for abscess following recent abdominal surgery for small bowel perforation. End-stage renal disease.  EXAM: CT ABDOMEN AND PELVIS WITH CONTRAST  TECHNIQUE: Multidetector CT imaging of the abdomen and pelvis was performed using the standard protocol following bolus administration of intravenous contrast.  CONTRAST:  80mL OMNIPAQUE IOHEXOL 300 MG/ML  SOLN  COMPARISON:  Prior CTs 07/01/2014 and 07/06/2014.  FINDINGS: Lower chest: Left pleural effusion and adjacent left lower lobe atelectasis appear mildly improved. There is no pericardial effusion. The heart is moderately enlarged.  Hepatobiliary: Contrast bolus is limited. No focal hepatic abnormalities identified. No evidence of gallstones, gallbladder wall thickening or biliary dilatation.  Pancreas: Stable appearance. A small fatty cleft within the pancreatic head is unchanged. No focal surrounding inflammatory change.  Spleen: Normal in size without focal abnormality.  Adrenals/Urinary Tract: Both adrenal glands appear normal.The kidneys appear stable. There is a stable cyst involving the interpolar region of the left kidney. There is limited contrast excretion from both kidneys. Perinephric soft tissue stranding is stable. There is no evidence of urinary tract calculus. The bladder appears unremarkable.  Stomach/Bowel: Nasogastric tube extends into the mid stomach. The stomach and small bowel are decompressed status post partial small bowel resection. There is contrast  material within the colon. The sigmoid colon demonstrates diverticulosis and wall thickening. No extravasated enteric contrast or evidence of bowel obstruction.There is a surgical drain within the upper abdomen. Ascites is again noted with multiple interloop components. In the pelvis, there is a better defined collection  in the cul-de-sac which has slightly enlarged. This measures 6.2 x 9.0 cm transverse and is associated with mild enhancement of its margins. This may reflect a developing abscess. No other focal fluid collections are identified. There is a small amount of air within the peritoneal cavity attributed to the recent surgery and/or drain.  Vascular/Lymphatic: There are no enlarged abdominal or pelvic lymph nodes. Diffuse atherosclerosis is again noted with dilatation of the abdominal aorta to 3.0 cm. No large vessel occlusion identified.  Reproductive: Multiple partially calcified uterine fibroids are noted. No evidence of adnexal mass.  Other: Open incision within the anterior abdominal wall again noted.  Musculoskeletal: No acute or significant osseous findings.  IMPRESSION: 1. Enlarging fluid collection within the pelvic cul-de-sac is associated with enhancement of its peritoneal margins and may reflect a developing abscess. This may be amenable to trans gluteal drainage. 2. The additional components of ascites throughout the peritoneal cavity are stable without other focal collection. 3. No evidence of bowel obstruction or extravasated enteric contrast status post partial small bowel resection.   Electronically Signed   By: Carey Bullocks M.D.   On: 07/09/2014 21:10   Dg Chest Port 1 View  07/12/2014   CLINICAL DATA:  End-stage renal disease.  Pulmonary edema.  EXAM: PORTABLE CHEST - 1 VIEW  COMPARISON:  07/09/2014  FINDINGS: Unchanged position of dialysis catheter and left IJ catheter. There is a nasogastric tube with tip in the stomach. Stable cardiac enlargement. Vascular congestion is unchanged. The tip is in the projection of the aortic arch.  IMPRESSION: 1. Stable vascular congestion.   Electronically Signed   By: Signa Kell M.D.   On: 07/12/2014 10:53   Ct Image Guided Fluid Drain By Catheter  07/10/2014   CLINICAL DATA:  Postop pelvic fluid collection concerning for abscess. sepsis despite  broad-spectrum antibiotics  EXAM: CT GUIDED DRAINAGE OF POSTERIOR PELVIC ABSCESS ABSCESS  ANESTHESIA/SEDATION: 1.0 Mg IV Versed 50 mcg IV Fentanyl  Total Moderate Sedation Time:  15 minutes  PROCEDURE: The procedure, risks, benefits, and alternatives were explained to the patient. Questions regarding the procedure were encouraged and answered. The patient understands and consents to the procedure.  The RIGHT TRANS GLUTEAL AREA was prepped with BETADINEin a sterile fashion, and a sterile drape was applied covering the operative field. A sterile gown and sterile gloves were used for the procedure. Local anesthesia was provided with 1% Lidocaine.  Previous imaging reviewed. Patient positioned prone. Noncontrast localization CT performed. The posterior pelvic fluid collection was localized. Under sterile conditions and local anesthesia, an 18 gauge 15 cm access needle was advanced from a medial trans gluteal approach into the collection. Needle position confirmed with CT. Guidewire advanced. Tract dilatation performed to advance a 10 [place] drain. Retention loop formed in the collection. Syringe aspiration yielded 40 cc serosanguineous fluid. Sample sent for Gram stain and culture. Catheter secured with a Prolene suture and connected to external suction bulb. Sterile dressing applied.  COMPLICATIONS: None immediate  FINDINGS: Imaging confirms needle access into the pelvic fluid collection for drain insertion  IMPRESSION: Successful CT-guided pelvic abscess drain insertion   Electronically Signed   By: Judie Petit.  Shick M.D.   On: 07/10/2014 17:07    Labs:  CBC:  Recent Labs  07/10/14 0415 07/11/14 0557 07/12/14 0430 07/13/14 0440  WBC 26.4* 23.0* 20.6* 46.7*  HGB 7.3* 7.1* 7.0* 7.1*  HCT 22.6* 21.7* 21.7* 22.8*  PLT 40* 130* 108* 97*    COAGS:  Recent Labs  07/01/14 1445 07/01/14 1813 07/02/14 0430 07/03/14 0445 07/04/14 0510 07/07/14 0400 07/10/14 0715  INR 1.44 1.83* 1.51*  --   --   --  1.21    APTT  --   --  35 38* 36 41* 37    BMP:  Recent Labs  07/11/14 1640 07/12/14 0430 07/12/14 1615 07/13/14 0440  NA 129* 129* 128* 131*  K 3.7 3.6 4.1 4.0  CL 94* 96 96 100  CO2 23 25 22 26   GLUCOSE 193* 195* 140* 133*  BUN 38* 42* 39* 44*  CALCIUM 8.1* 8.0* 7.8* 8.0*  CREATININE 1.58* 1.70* 1.58* 1.81*  GFRNONAA 35* 32* 35* 30*  GFRAA 41* 37* 41* 34*    LIVER FUNCTION TESTS:  Recent Labs  07/03/14 0445  07/06/14 0552  07/09/14 0430  07/11/14 1640 07/12/14 0430 07/12/14 1615 07/13/14 0440  BILITOT 1.8*  --  1.9*  --  2.0*  --   --   --   --  1.9*  AST 231*  --  25  --  17  --   --   --   --  60*  ALT 302*  --  69*  --  29  --   --   --   --  55*  ALKPHOS 78  --  75  --  111  --   --   --   --  155*  PROT 5.3*  --  4.5*  --  4.9*  --   --   --   --  5.8*  ALBUMIN 2.0*  < > 1.4*  < > 1.2*  < > 1.6* 1.6* 1.7* 1.7*  < > = values in this interval not displayed.  Assessment and Plan:  Rt TG drain intact Output slowing but still 40 cc daily or more Will follow  Signed: Blannie Shedlock A 07/13/2014, 3:49 PM   I spent a total of 15 Minutes in face to face in clinical consultation/evaluation, greater than 50% of which was counseling/coordinating care for TG drain

## 2014-07-13 NOTE — Progress Notes (Signed)
12 Days Post-Op  Subjective: Passed a little flatus    Objective: Vital signs in last 24 hours: Temp:  [97.4 F (36.3 C)-98.1 F (36.7 C)] 98.1 F (36.7 C) (04/11 0759) Resp:  [15-21] 17 (04/11 0700) BP: (97-140)/(50-89) 129/72 mmHg (04/11 0700) SpO2:  [92 %-100 %] 99 % (04/11 0700) Weight:  [90.7 kg (199 lb 15.3 oz)] 90.7 kg (199 lb 15.3 oz) (04/11 0500) Last BM Date: 06/28/14  Intake/Output from previous day: 04/10 0701 - 04/11 0700 In: 4253 [I.V.:250; IV Piggyback:1108; TPN:2880] Out: 6747 [Emesis/NG output:300; Drains:40] Intake/Output this shift:    Incision/Wound:wound open clean  Drain in place serous    Soft Min TTP  Lab Results:   Recent Labs  07/12/14 0430 07/13/14 0440  WBC 20.6* 46.7*  HGB 7.0* 7.1*  HCT 21.7* 22.8*  PLT 108* 97*   BMET  Recent Labs  07/12/14 1615 07/13/14 0440  NA 128* 131*  K 4.1 4.0  CL 96 100  CO2 22 26  GLUCOSE 140* 133*  BUN 39* 44*  CREATININE 1.58* 1.81*  CALCIUM 7.8* 8.0*   PT/INR No results for input(s): LABPROT, INR in the last 72 hours. ABG No results for input(s): PHART, HCO3 in the last 72 hours.  Invalid input(s): PCO2, PO2  Studies/Results: Dg Chest Port 1 View  07/12/2014   CLINICAL DATA:  End-stage renal disease.  Pulmonary edema.  EXAM: PORTABLE CHEST - 1 VIEW  COMPARISON:  07/09/2014  FINDINGS: Unchanged position of dialysis catheter and left IJ catheter. There is a nasogastric tube with tip in the stomach. Stable cardiac enlargement. Vascular congestion is unchanged. The tip is in the projection of the aortic arch.  IMPRESSION: 1. Stable vascular congestion.   Electronically Signed   By: Signa Kell M.D.   On: 07/12/2014 10:53    Anti-infectives: Anti-infectives    Start     Dose/Rate Route Frequency Ordered Stop   07/07/14 1200  vancomycin (VANCOCIN) IVPB 1000 mg/200 mL premix     1,000 mg 200 mL/hr over 60 Minutes Intravenous Every 24 hours 07/07/14 0937     07/06/14 2000  fluconazole  (DIFLUCAN) IVPB 400 mg     400 mg 100 mL/hr over 120 Minutes Intravenous Every 24 hours 07/06/14 1507     07/06/14 1800  piperacillin-tazobactam (ZOSYN) IVPB 2.25 g     2.25 g 100 mL/hr over 30 Minutes Intravenous 4 times per day 07/06/14 1505     07/05/14 2000  fluconazole (DIFLUCAN) IVPB 200 mg  Status:  Discontinued     200 mg 100 mL/hr over 60 Minutes Intravenous Every 24 hours 07/05/14 0757 07/06/14 1507   07/05/14 1400  piperacillin-tazobactam (ZOSYN) IVPB 2.25 g  Status:  Discontinued     2.25 g 100 mL/hr over 30 Minutes Intravenous 3 times per day 07/05/14 0749 07/06/14 1505   07/02/14 1800  vancomycin (VANCOCIN) IVPB 1000 mg/200 mL premix  Status:  Discontinued     1,000 mg 200 mL/hr over 60 Minutes Intravenous Every 24 hours 07/01/14 1858 07/05/14 0801   07/01/14 2200  piperacillin-tazobactam (ZOSYN) IVPB 3.375 g  Status:  Discontinued     3.375 g 100 mL/hr over 30 Minutes Intravenous 4 times per day 07/01/14 1858 07/05/14 0749   07/01/14 2000  fluconazole (DIFLUCAN) IVPB 400 mg  Status:  Discontinued     400 mg 100 mL/hr over 120 Minutes Intravenous Every 24 hours 07/01/14 1858 07/05/14 0757   07/01/14 1400  fluconazole (DIFLUCAN) IVPB 200 mg  Status:  Discontinued     200 mg 100 mL/hr over 60 Minutes Intravenous Every 24 hours 07/01/14 1330 07/01/14 1853   07/01/14 1000  piperacillin-tazobactam (ZOSYN) IVPB 2.25 g  Status:  Discontinued     2.25 g 100 mL/hr over 30 Minutes Intravenous Every 8 hours 07/01/14 0952 07/01/14 1853   07/01/14 1000  vancomycin (VANCOCIN) 500 mg in sodium chloride 0.9 % 100 mL IVPB     500 mg 100 mL/hr over 60 Minutes Intravenous  Once 07/01/14 0952 07/01/14 1126   06/30/14 1200  vancomycin (VANCOCIN) IVPB 1000 mg/200 mL premix     1,000 mg 200 mL/hr over 60 Minutes Intravenous  Once 06/30/14 0944 06/30/14 1403   06/30/14 1200  ceFEPIme (MAXIPIME) 2 g in dextrose 5 % 50 mL IVPB     2 g 100 mL/hr over 30 Minutes Intravenous  Once 06/30/14 0944  06/30/14 1425   06/30/14 0100  vancomycin (VANCOCIN) 2,000 mg in sodium chloride 0.9 % 500 mL IVPB     2,000 mg 250 mL/hr over 120 Minutes Intravenous  Once 06/30/14 0048 06/30/14 0525   06/30/14 0100  ceFEPIme (MAXIPIME) 2 g in dextrose 5 % 50 mL IVPB     2 g 100 mL/hr over 30 Minutes Intravenous  Once 06/30/14 0048 06/30/14 0322   06/27/14 0600  cefUROXime (ZINACEF) 1.5 g in dextrose 5 % 50 mL IVPB     1.5 g 100 mL/hr over 30 Minutes Intravenous On call to O.R. 06/26/14 1019 06/27/14 0630   06/19/14 1800  oseltamivir (TAMIFLU) capsule 30 mg     30 mg Oral Every M-W-F (1800) 06/19/14 1003 06/22/14 1841   06/18/14 1600  oseltamivir (TAMIFLU) capsule 30 mg  Status:  Discontinued     30 mg Oral Daily 06/18/14 1538 06/19/14 1003      Assessment/Plan: s/p Procedure(s): EXPLORATORY LAPAROTOMY WITH CLOSURE OF PERFORATED PYLORIC ULCER (N/A) SMALL BOWEL RESECTION (N/A)  Ileus  Check KUB NGT output less  Intraabdominal abscess  drained check CT later this week Clamp NGT KUB Wound clean  LEUKOCYTOSIS would re check labs   LOS: 26 days    Allaina Brotzman A. 07/13/2014

## 2014-07-13 NOTE — Progress Notes (Signed)
ANTIBIOTIC CONSULT NOTE - FOLLOW UP  Pharmacy Consult for Vancomycin, Zosyn, and fluconazole Indication: pneumonia/peritonitis  No Known Allergies  Patient Measurements: Height: 5\' 5"  (165.1 cm) Weight: 199 lb 15.3 oz (90.7 kg) IBW/kg (Calculated) : 57 Adjusted Body Weight: n/a  Vital Signs: Temp: 98.1 F (36.7 C) (04/11 0800) Temp Source: Axillary (04/11 0800) BP: 141/90 mmHg (04/11 1200) Intake/Output from previous day: 04/10 0701 - 04/11 0700 In: 4253 [I.V.:250; IV Piggyback:1108; TPN:2880] Out: 6747 [Emesis/NG output:300; Drains:40] Intake/Output from this shift: Total I/O In: 583 [I.V.:60; IV Piggyback:43; TPN:480] Out: 1430 [Other:1430]  Labs:  Recent Labs  07/11/14 0557  07/12/14 0430 07/12/14 1615 07/13/14 0440  WBC 23.0*  --  20.6*  --  46.7*  HGB 7.1*  --  7.0*  --  7.1*  PLT 130*  --  108*  --  97*  CREATININE 1.74*  < > 1.70* 1.58* 1.81*  < > = values in this interval not displayed. Estimated Creatinine Clearance: 37.7 mL/min (by C-G formula based on Cr of 1.81).  Recent Labs  07/13/14 0458 07/13/14 1200  VANCOTROUGH  --  29.4*  VANCORANDOM 32.0  --      Microbiology: Recent Results (from the past 720 hour(s))  MRSA PCR Screening     Status: None   Collection Time: 06/17/14  9:29 PM  Result Value Ref Range Status   MRSA by PCR NEGATIVE NEGATIVE Final    Comment:        The GeneXpert MRSA Assay (FDA approved for NASAL specimens only), is one component of a comprehensive MRSA colonization surveillance program. It is not intended to diagnose MRSA infection nor to guide or monitor treatment for MRSA infections.   Clostridium Difficile by PCR     Status: None   Collection Time: 06/18/14  3:52 AM  Result Value Ref Range Status   C difficile by pcr NEGATIVE NEGATIVE Final  Clostridium Difficile by PCR     Status: None   Collection Time: 06/27/14  7:40 PM  Result Value Ref Range Status   C difficile by pcr NEGATIVE NEGATIVE Final   Culture, blood (routine x 2)     Status: None   Collection Time: 06/29/14  8:10 AM  Result Value Ref Range Status   Specimen Description BLOOD RIGHT ANTECUBITAL  Final   Special Requests BOTTLES DRAWN AEROBIC ONLY 3CC  Final   Culture   Final    NO GROWTH 5 DAYS Performed at [facility]    Report Status 07/05/2014 FINAL  Final  Culture, blood (routine x 2)     Status: None   Collection Time: 06/29/14  8:15 AM  Result Value Ref Range Status   Specimen Description BLOOD RIGHT HAND  Final   Special Requests BOTTLES DRAWN AEROBIC ONLY 2CC  Final   Culture   Final    NO GROWTH 5 DAYS Note: Culture results may be compromised due to an inadequate volume of blood received in culture bottles. Performed at [facility]    Report Status 07/05/2014 FINAL  Final  MRSA PCR Screening     Status: None   Collection Time: 07/01/14  2:49 PM  Result Value Ref Range Status   MRSA by PCR NEGATIVE NEGATIVE Final    Comment:        The GeneXpert MRSA Assay (FDA approved for NASAL specimens only), is one component of a comprehensive MRSA colonization surveillance program. It is not intended to diagnose MRSA infection nor to guide or monitor treatment  for MRSA infections.   Anaerobic culture     Status: None   Collection Time: 07/01/14  4:18 PM  Result Value Ref Range Status   Specimen Description FLUID PERITONEAL  Final   Special Requests ON SWAB  Final   Gram Stain   Final    RARE WBC PRESENT, PREDOMINANTLY MONONUCLEAR NO ORGANISMS SEEN Performed at [facility]    Culture   Final    NO ANAEROBES ISOLATED Performed at [facility]    Report Status 07/07/2014 FINAL  Final  Body fluid culture     Status: None   Collection Time: 07/01/14  4:18 PM  Result Value Ref Range Status   Specimen Description FLUID PERITONEAL  Final   Special Requests ON SWAB  Final   Gram Stain   Final    RARE WBC PRESENT, PREDOMINANTLY MONONUCLEAR NO ORGANISMS SEEN Performed  at [facility]    Culture   Final    RARE LACTOBACILLUS SPECIES Note: Standardized susceptibility testing for this organism is not available. CRITICAL RESULT CALLED TO, READ BACK BY AND VERIFIED WITH: CHRIS RN ON 2H AT 1047 [id number] BY CASTC Performed at [facility]    Report Status 07/04/2014 FINAL  Final  Culture, routine-abscess     Status: None (Preliminary result)   Collection Time: 07/10/14  5:15 PM  Result Value Ref Range Status   Specimen Description ABSCESS RIGHT HIP  Final   Special Requests Normal  Final   Gram Stain   Final    FEW WBC PRESENT,BOTH PMN AND MONONUCLEAR NO SQUAMOUS EPITHELIAL CELLS SEEN NO ORGANISMS SEEN Performed at [facility]    Culture   Final    NO GROWTH 2 DAYS Performed at [facility]    Report Status PENDING  Incomplete  Anaerobic culture     Status: None (Preliminary result)   Collection Time: 07/10/14  5:15 PM  Result Value Ref Range Status   Specimen Description ABSCESS RIGHT HIP  Final   Special Requests Normal  Final   Gram Stain   Final    FEW WBC PRESENT, PREDOMINANTLY PMN NO SQUAMOUS EPITHELIAL CELLS SEEN NO ORGANISMS SEEN Performed at [facility]    Culture   Final    NO ANAEROBES ISOLATED; CULTURE IN PROGRESS FOR 5 DAYS Performed at [facility]    Report Status PENDING  Incomplete    Assessment: 59 yo female on vancomycin/zosyn and fluconazole for possible PNA and peritonitis. She is now s/p Ex-lap with evacuation of ascites, closure of perforated pyloric ulcer and resection of ileum with primary anatomosis (noted on TPN).   CRRT resumed 4/4 at 1916 pm.  Planning to stop CRRT tonight at 1800 and plan to re-attempt IHD tomorrow morning.  Vancomycin 3/29> Cefepime 3/29>3/30 Zosyn 3/30> 3/20 fluconazole>  3/30 peritoneal fluid- rare lactobacillus (no sensitivies performed; usually sensitive to PCN) 3/28 blood x2 - neg 3/36 c diff - neg  Goal of Therapy:  Pre-HD  vancomycin trough 15-25  Plan:  - Hold vanc for now, AM VR to reassess next dose - likely not needed until after HD starts - Change Zosyn back to 2.25 q 8 hrs - Reduce fluconazole back to 200 q 24 for now.  Could change to 400 q HD once HD schedule regular.  Tad Moore, BCPS  Clinical Pharmacist Pager [phone]  07/13/2014 1:17 PM

## 2014-07-14 ENCOUNTER — Inpatient Hospital Stay (HOSPITAL_COMMUNITY): Payer: Medicaid Other

## 2014-07-14 LAB — RENAL FUNCTION PANEL
Albumin: 1.6 g/dL — ABNORMAL LOW (ref 3.5–5.2)
Anion gap: 12 (ref 5–15)
BUN: 68 mg/dL — ABNORMAL HIGH (ref 6–23)
CO2: 21 mmol/L (ref 19–32)
CREATININE: 2.63 mg/dL — AB (ref 0.50–1.10)
Calcium: 7.9 mg/dL — ABNORMAL LOW (ref 8.4–10.5)
Chloride: 97 mmol/L (ref 96–112)
GFR calc non Af Amer: 19 mL/min — ABNORMAL LOW (ref >90)
GFR, EST AFRICAN AMERICAN: 22 mL/min — AB (ref >90)
Glucose, Bld: 102 mg/dL — ABNORMAL HIGH (ref 70–99)
PHOSPHORUS: 2.6 mg/dL (ref 2.3–4.6)
Potassium: 3.4 mmol/L — ABNORMAL LOW (ref 3.5–5.1)
Sodium: 130 mmol/L — ABNORMAL LOW (ref 135–145)

## 2014-07-14 LAB — CBC WITH DIFFERENTIAL/PLATELET
BASOS PCT: 0 % (ref 0–1)
Basophils Absolute: 0 10*3/uL (ref 0.0–0.1)
EOS ABS: 0.4 10*3/uL (ref 0.0–0.7)
Eosinophils Relative: 3 % (ref 0–5)
HCT: 23.2 % — ABNORMAL LOW (ref 36.0–46.0)
Hemoglobin: 7.3 g/dL — ABNORMAL LOW (ref 12.0–15.0)
LYMPHS ABS: 1.8 10*3/uL (ref 0.7–4.0)
Lymphocytes Relative: 14 % (ref 12–46)
MCH: 27.1 pg (ref 26.0–34.0)
MCHC: 31.5 g/dL (ref 30.0–36.0)
MCV: 86.2 fL (ref 78.0–100.0)
MONO ABS: 1 10*3/uL (ref 0.1–1.0)
Monocytes Relative: 8 % (ref 3–12)
NEUTROS ABS: 9.7 10*3/uL — AB (ref 1.7–7.7)
Neutrophils Relative %: 75 % (ref 43–77)
Platelets: 99 10*3/uL — ABNORMAL LOW (ref 150–400)
RBC: 2.69 MIL/uL — ABNORMAL LOW (ref 3.87–5.11)
RDW: 23.1 % — AB (ref 11.5–15.5)
WBC: 12.9 10*3/uL — AB (ref 4.0–10.5)

## 2014-07-14 LAB — CULTURE, ROUTINE-ABSCESS
Culture: NO GROWTH
Special Requests: NORMAL

## 2014-07-14 LAB — GLUCOSE, CAPILLARY
GLUCOSE-CAPILLARY: 110 mg/dL — AB (ref 70–99)
GLUCOSE-CAPILLARY: 108 mg/dL — AB (ref 70–99)
Glucose-Capillary: 128 mg/dL — ABNORMAL HIGH (ref 70–99)
Glucose-Capillary: 130 mg/dL — ABNORMAL HIGH (ref 70–99)
Glucose-Capillary: 135 mg/dL — ABNORMAL HIGH (ref 70–99)
Glucose-Capillary: 135 mg/dL — ABNORMAL HIGH (ref 70–99)
Glucose-Capillary: 188 mg/dL — ABNORMAL HIGH (ref 70–99)
Glucose-Capillary: 86 mg/dL (ref 70–99)

## 2014-07-14 LAB — MAGNESIUM: Magnesium: 2 mg/dL (ref 1.5–2.5)

## 2014-07-14 LAB — VANCOMYCIN, RANDOM: Vancomycin Rm: 24.7 ug/mL

## 2014-07-14 MED ORDER — HYDROCODONE-ACETAMINOPHEN 5-325 MG PO TABS
1.0000 | ORAL_TABLET | ORAL | Status: DC | PRN
  Administered 2014-07-16 – 2014-07-18 (×4): 2 via ORAL
  Administered 2014-07-19: 1 via ORAL
  Administered 2014-07-19 – 2014-07-27 (×7): 2 via ORAL
  Filled 2014-07-14 (×3): qty 2
  Filled 2014-07-14: qty 1
  Filled 2014-07-14 (×9): qty 2

## 2014-07-14 MED ORDER — LIDOCAINE-PRILOCAINE 2.5-2.5 % EX CREA
1.0000 "application " | TOPICAL_CREAM | CUTANEOUS | Status: DC | PRN
  Filled 2014-07-14: qty 5

## 2014-07-14 MED ORDER — SODIUM CHLORIDE 0.9 % IV SOLN: 100.0000 mL | INTRAVENOUS | Status: DC | PRN

## 2014-07-14 MED ORDER — POTASSIUM CHLORIDE 10 MEQ/100ML IV SOLN
10.0000 meq | Freq: Once | INTRAVENOUS | Status: AC
  Administered 2014-07-14: 10 meq via INTRAVENOUS
  Filled 2014-07-14: qty 100

## 2014-07-14 MED ORDER — BOOST / RESOURCE BREEZE PO LIQD
1.0000 | Freq: Three times a day (TID) | ORAL | Status: DC
  Administered 2014-07-14 – 2014-07-20 (×15): 1 via ORAL

## 2014-07-14 MED ORDER — DEXTROSE 10 % IV SOLN
Freq: Once | INTRAVENOUS | Status: AC
  Administered 2014-07-14: 18:00:00 via INTRAVENOUS

## 2014-07-14 MED ORDER — LIDOCAINE HCL (PF) 1 % IJ SOLN: 5.0000 mL | INTRAMUSCULAR | Status: DC | PRN

## 2014-07-14 MED ORDER — HEPARIN SODIUM (PORCINE) 1000 UNIT/ML DIALYSIS: 1000.0000 [IU] | INTRAMUSCULAR | Status: DC | PRN

## 2014-07-14 MED ORDER — FAT EMULSION 20 % IV EMUL
240.0000 mL | INTRAVENOUS | Status: AC
  Administered 2014-07-14: 240 mL via INTRAVENOUS
  Filled 2014-07-14: qty 250

## 2014-07-14 MED ORDER — POTASSIUM CHLORIDE 10 MEQ/50ML IV SOLN
INTRAVENOUS | Status: AC
  Filled 2014-07-14: qty 50

## 2014-07-14 MED ORDER — NEPRO/CARBSTEADY PO LIQD: 237.0000 mL | ORAL | Status: DC | PRN

## 2014-07-14 MED ORDER — NEPRO/CARBSTEADY PO LIQD
237.0000 mL | ORAL | Status: DC | PRN
  Filled 2014-07-14: qty 237

## 2014-07-14 MED ORDER — TRACE MINERALS CR-CU-F-FE-I-MN-MO-SE-ZN IV SOLN
INTRAVENOUS | Status: AC
  Administered 2014-07-14: 23:00:00 via INTRAVENOUS
  Filled 2014-07-14: qty 1992

## 2014-07-14 MED ORDER — BISACODYL 10 MG RE SUPP
10.0000 mg | Freq: Every day | RECTAL | Status: DC | PRN
Start: 2014-07-14 — End: 2014-09-11

## 2014-07-14 MED ORDER — PENTAFLUOROPROP-TETRAFLUOROETH EX AERO: 1.0000 "application " | INHALATION_SPRAY | CUTANEOUS | Status: DC | PRN

## 2014-07-14 MED ORDER — POTASSIUM CHLORIDE 10 MEQ/50ML IV SOLN
10.0000 meq | INTRAVENOUS | Status: AC
  Administered 2014-07-14 (×3): 10 meq via INTRAVENOUS
  Filled 2014-07-14 (×2): qty 50

## 2014-07-14 MED ORDER — ALTEPLASE 2 MG IJ SOLR
2.0000 mg | Freq: Once | INTRAMUSCULAR | Status: AC | PRN
  Filled 2014-07-14: qty 2

## 2014-07-14 MED ORDER — CETYLPYRIDINIUM CHLORIDE 0.05 % MT LIQD: 7.0000 mL | Freq: Two times a day (BID) | OROMUCOSAL | Status: DC

## 2014-07-14 MED ORDER — LIDOCAINE-PRILOCAINE 2.5-2.5 % EX CREA: 1.0000 "application " | TOPICAL_CREAM | CUTANEOUS | Status: DC | PRN

## 2014-07-14 MED ORDER — CHLORHEXIDINE GLUCONATE 0.12 % MT SOLN: 15.0000 mL | Freq: Two times a day (BID) | OROMUCOSAL | Status: DC

## 2014-07-14 MED ORDER — ALTEPLASE 2 MG IJ SOLR: 2.0000 mg | Freq: Once | INTRAMUSCULAR | Status: DC | PRN

## 2014-07-14 MED ORDER — MORPHINE SULFATE 2 MG/ML IJ SOLN
1.0000 mg | INTRAMUSCULAR | Status: DC | PRN
  Administered 2014-07-19 – 2014-07-30 (×4): 1 mg via INTRAVENOUS
  Filled 2014-07-14 (×5): qty 1

## 2014-07-14 NOTE — Progress Notes (Signed)
NUTRITION FOLLOW-UP   DOCUMENTATION CODES Per approved criteria  -Obesity Unspecified   INTERVENTION: Clinimix per Pharmacy, maximize protein Nutrition goals: 2200-2600 kcal and 120-140 grams protein per day  Resource Breeze po TID, each supplement provides 250 kcal and 9 grams of protein  NUTRITION DIAGNOSIS: Inadequate oral intake  related to altered GI function as evidenced by limited intake, ongoing.   Goal: Pt to meet >/= 90% of their estimated nutrition needs, not met (meeting 83% of needs)   Monitor:  TPN, weight trends, labs, I/O's, diet advancement, PO intake, supplement acceptance  ASSESSMENT: Pt with CKD and hypertension presents with of shortness of breath and generalized weakness x 2 weeks. Pt found to have metabolic acidosis from worsening uremia, H1N1 flu. Pt started on HD. 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  Pt transferred to ICU due to acute abdominal pain. Pt s/p exp lab, evacuation of ascites, closure of ulcer, 14 in resection of ileum due to perforated pyloric ulcer with diffuse peritonitis, ischemic necrosis of mid ileum.   Pt extubated 4/2.  Pt previously on CRRT transitioned to IHD 4/12.  Pharmacy asked to concentrate TPN for transition to IHD.   TPN: Clinimix E 5/15 @ 83 ml/hr with 20% lipids @ 10 ml/hr Provides: 1893 kcal (86% of needs) and 100 grams protein (83% of needs) Sodium and potassium low CBG's: 102-130  NG tube fell out 4/11.  Pt started on clear liquids 4/12, will add resource breeze.   Height: Ht Readings from Last 1 Encounters:  07/13/14 $RemoveB'5\' 6"'JpEqsYRZ$  (1.676 m)    Weight: Wt Readings from Last 1 Encounters:  07/14/14 205 lb 4 oz (93.1 kg)  Usual weight: 220 lb (100 kg)  BMI:  Body mass index is 33.14 kg/(m^2). Class II obesity  Adjusted body weight: 84.8 kg  Estimated Nutritional Needs: Kcal: 2200-2600 Protein: 120-140 grams Fluid: 1.2 L/day  Skin: +1 LUE, LLE  edema  Diet Order: TPN (CLINIMIX) Adult without lytes Diet clear liquid Room service appropriate?: Yes; Fluid consistency:: Thin TPN (CLINIMIX) Adult without lytes   Intake/Output Summary (Last 24 hours) at 07/14/14 1522 Last data filed at 07/14/14 1000  Gross per 24 hour  Intake   2301 ml  Output    899 ml  Net   1402 ml    Last BM: 4/12, loose/small  Labs:   Recent Labs Lab 07/12/14 0430  07/13/14 0440 07/13/14 1600 07/14/14 0455  NA 129*  < > 131* 130* 130*  K 3.6  < > 4.0 3.9 3.4*  CL 96  < > 100 98 97  CO2 25  < > $R'26 23 21  'ho$ BUN 42*  < > 44* 42* 68*  CREATININE 1.70*  < > 1.81* 1.75* 2.63*  CALCIUM 8.0*  < > 8.0* 7.9* 7.9*  MG 2.2  --  2.1  --  2.0  PHOS 2.0*  < > 1.7* 3.2 2.6  GLUCOSE 195*  < > 133* 191* 102*  < > = values in this interval not displayed.  CBG (last 3)   Recent Labs  07/14/14 0457 07/14/14 0754 07/14/14 1148  GLUCAP 110* 108* 130*    Scheduled Meds: . sodium chloride   Intravenous Once  . darbepoetin (ARANESP) injection - DIALYSIS  150 mcg Intravenous Q Sat-HD  . feeding supplement (RESOURCE BREEZE)  1 Container Oral TID BM  . fluconazole (DIFLUCAN) IV  200 mg Intravenous Q24H  .  hydrocortisone sodium succinate  50 mg Intravenous Daily  . insulin aspart  0-15 Units Subcutaneous 6 times per day  . ipratropium  0.5 mg Nebulization Q6H  . levalbuterol  0.63 mg Nebulization Q6H  . metoprolol  2.5 mg Intravenous 4 times per day  . pantoprazole (PROTONIX) IV  40 mg Intravenous Q12H  . piperacillin-tazobactam (ZOSYN)  IV  2.25 g Intravenous 3 times per day  . potassium chloride  10 mEq Intravenous Q1 Hr x 4  . vancomycin  1,000 mg Intravenous Q24H    Continuous Infusions: . sodium chloride 10 mL/hr at 07/13/14 0800  . TPN (CLINIMIX) Adult without lytes 83 mL/hr at 07/13/14 1742   And  . fat emulsion 240 mL (07/13/14 1741)  . TPN (CLINIMIX) Adult without lytes     And  . fat emulsion     Maylon Peppers RD, [place], Aline  Pager [phone] After Hours Pager

## 2014-07-14 NOTE — Progress Notes (Signed)
eLink Physician-Brief Progress Note Patient Name: [name] DOB: 01/03/1956 MRN: [id number]   Date of Service  07/14/2014  HPI/Events of Note  Review of CXR reveals L IJ central venous catheter tip in mid SVC.   eICU Interventions  OK to use for medication, fluid and IV nutrition.      Intervention Category Intermediate Interventions: Diagnostic test evaluation  Lenell AntuSommer,Mayco Walrond Eugene 07/14/2014, 10:55 PM

## 2014-07-14 NOTE — Progress Notes (Signed)
Name: [name] MRN: [id number] DOB: [birth year]    ADMISSION DATE:  06/17/2014 CONSULTATION DATE:  3/30  REFERRING MD :  Mahala MenghiniSamtani   CHIEF COMPLAINT:  Hypoxia   BRIEF PATIENT DESCRIPTION: 59 yo female smoker with hx HTN, ESRD on HD (just began this admit), COPD, failed L AV fistula, initially admitted 3/16 with flu, acute hypoxic resp failure.  Ultimately tx to [facility] for worsening metabolic parameters and need for HD.  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  Has made little improvement and on 3/30 has worsening hypoxia, generalized pain, hyperkalemia, worsening leukocytosis and PCCM consulted.     SIGNIFICANT EVENTS  3/16 influenza A+ 3/18 R ACA stroke 3/31 to or ex lap for per pyloric ulcer and sm bowel resection 4/1- septic shock on pressors 4/3-pressors weaned off  4/4- restart pressors, CRRT 4/5- run of SVTs 4/6- off levo switched to neo 4/7- pelvic abscess on CT 4/8 - IR placed pelvic drain 4/9 - off pressors 4/11 - long run of SVT  STUDIES:  3/19 MRI: multifocal acute infarction of both hemispheres, large right distal ACA 3/22 TEE: severe RAE, severely reduced RV function, severe TR, no LAA thrombus, small PFO on TEE 3/30 CT chest/abd: perf bowel 2/2 presumed embolism in setting of ACA, ?rectrovaginal abscess development, LLL collapse 4/4 CT abd/pelvis: small pleural effusion left, no leaking contrast or SBO findings  4/7 CT abd/pelvis: increased pelvic abscess growth   SUBJECTIVE:  Removed NG tube overnight. Pt had BM. Wanting to eat peaches.   VITAL SIGNS: Temp:  [97.7 F (36.5 C)-98.2 F (36.8 C)] 98.1 F (36.7 C) (04/12 0400) Resp:  [13-27] 20 (04/12 0700) BP: (101-141)/(50-90) 107/50 mmHg (04/12 0700) SpO2:  [93 %-100 %] 96 % (04/12 0700) Weight:  [187 lb 9.8 oz (85.1 kg)-199 lb 11.8 oz (90.6 kg)] 199 lb 11.8 oz (90.6 kg) (04/12 0500)  PHYSICAL EXAMINATION: General: resting in bed,NAD, chronically ill appearing HEENT: PERRL,  EOMI, no scleral icterus Cardiac: RRR, no rubs, murmurs or gallops Pulm: clear to auscultation bilaterally, moving normal volumes of air Abd: soft, nondistended, BS hypoactive, minimal pain Ext: warm and well perfused, no pedal edema Neuro: alert and oriented X3, follows commands   CBC Recent Labs     07/12/14  0430  07/13/14  0440  07/14/14  0455  WBC  20.6*  46.7*  12.9*  HGB  7.0*  7.1*  7.3*  HCT  21.7*  22.8*  23.2*  PLT  108*  97*  99*    Coag's No results for input(s): APTT, INR in the last 72 hours.  BMET Recent Labs     07/13/14  0440  07/13/14  1600  07/14/14  0455  NA  131*  130*  130*  K  4.0  3.9  3.4*  CL  100  98  97  CO2  26  23  21   BUN  44*  42*  68*  CREATININE  1.81*  1.75*  2.63*  GLUCOSE  133*  191*  102*    Electrolytes Recent Labs     07/12/14  0430   07/13/14  0440  07/13/14  1600  07/14/14  0455  CALCIUM  8.0*   < >  8.0*  7.9*  7.9*  MG  2.2   --   2.1   --   2.0  PHOS  2.0*   < >  1.7*  3.2  2.6   < > =  values in this interval not displayed.    Liver Enzymes Recent Labs     07/13/14  0440  07/13/14  1600  07/14/14  0455  AST  60*   --    --   ALT  55*   --    --   ALKPHOS  155*   --    --   BILITOT  1.9*   --    --   ALBUMIN  1.7*  1.8*  1.6*   Glucose Recent Labs     07/13/14  0757  07/13/14  1153  07/13/14  1614  07/13/14  2119 07/14/14  07/14/14  0457  GLUCAP  159*  174*  187*  135*  86  110*    Imaging Dg Abd 1 View  07/13/2014   CLINICAL DATA:  Ileus  EXAM: ABDOMEN - 1 VIEW  COMPARISON:  CT abdomen pelvis dated 07/09/2014  FINDINGS: Nonobstructive bowel gas pattern.  Contrast within distal small bowel in the right lower quadrant, ascending colon, and splenic flexure.  Pigtail drain in the right lower quadrant.  Enteric tube in the proximal gastric body.  IMPRESSION: Nonobstructive bowel gas pattern.  Residual contrast in the distal small bowel and colon, as above, unchanged from prior CT and suggesting  adynamic colonic ileus.  Pigtail drain in the right lower quadrant.   Electronically Signed   By: Charline Bills M.D.   On: 07/13/2014 12:48   Dg Chest Port 1 View  07/12/2014   CLINICAL DATA:  End-stage renal disease.  Pulmonary edema.  EXAM: PORTABLE CHEST - 1 VIEW  COMPARISON:  07/09/2014  FINDINGS: Unchanged position of dialysis catheter and left IJ catheter. There is a nasogastric tube with tip in the stomach. Stable cardiac enlargement. Vascular congestion is unchanged. The tip is in the projection of the aortic arch.  IMPRESSION: 1. Stable vascular congestion.   Electronically Signed   By: Signa Kell M.D.   On: 07/12/2014 10:53   ASSESSMENT / PLAN: ETT 3/31 >> 4/02  PULMONARY A: Acute hypoxic respiratory failure 2nd to H1N1, hypervolemia, HCAP. Hx of COPD. Atelectasis. Night time hypoxia concern for OHS or OSA will add prn CPAP qhs P: Oxygen to keep SpO2 > 92% Bronchial hygiene Negative fluid balance as tolerated Scheduled BD's CPAP qhs as tolerated  CARDIAC A: Chronic dCHF - EF 60-65%, grade 1 diastolic dysfunction. SVT runs 4/04>>transitioned to neo (pt good EF 55-60%) Hypotensive. P: Wean off solu cortef (will change to  qdx3 days) stop on 4/13 Add metoprolol 2.5mg  q6h for SVTs if doesn't improve will need cardiology input   GASTROENTEROLOGY A: Pyloric ulcer perforation s/p laparotomy P: Post op care per [facility] TPN per [facility] Continue protonix Prolonged ileus with no flatus or BM Pelvic drain per IR-still with some output  RENAL: A: ESRD P: CVVH stopped  Intermittent HD on 4/12  HEMATOLOGY A: Anemia of critical illness and chronic disease. Leukocytosis. WBC 40 >>12.6 this AM likely lab error Thrombocytopenia improving. Pltlt 99. HIT only slightly + but serotonin assay negative, therefore will hold off argatroban at this time could be component of Abx toxicity or ongoing sepsis  P: F/u CBC SCD's Hgb>7 goal   INFECTION A: Peritonitis 2nd to  perforated pyloric ulcer >> f/u CT 4/08 concern for abscess. HCAP with CXR improving IR for aspiration/drain of pelvic fluid collection suspected abscess P: Day 14 of Abx, currently on zosyn, vancomycin, diflucan  3/30 anaerobic>>rare lactobacillus  4/08 Pelvic fluid >> WBC no organisms  Pt recultured on 4/11 for concerning WBC of 46 BCx>>  ENDOCRINE A: DM type II. P: SSI  NEUROLOGY A: ACA CVA with Lt sided weakness. Deconditioning. P: Off ASA due to thrombocytopenia PT/OT since on intermittent HD  Summary: Pt with pelvic abscess s/p drain, prolonged ilieus after PUD perf and SB necrosis on TPN, continues on CVVH. May need to consider GOC conversation with family.  Transfer to SDU. Ambulate with PT today.    Christen Bame, MD  IM PGY-3 Pgr: [phone]  Attending:  I have seen and examined the patient with nurse practitioner/resident and agree with the note above.   On my exam Ms. Weiland really looks great today.  Lungs clear, abd dressing c/d/i  She had a bowel movement yesterday, is more awake and alert, and her BP and HR are stable.  Principal Problem:   Acute respiratory failure with hypoxia Active Problems:   Cerebral thrombosis with cerebral infarction   Hypertension   ESRD needing dialysis   Obesity (BMI 30-39.9)   Depression   Left renal mass   Chronic diastolic heart failure   H1N1 influenza   Tobacco use disorder   Renal failure (ARF), acute on chronic   Hyperlipidemia   PSVT (paroxysmal supraventricular tachycardia)   Hypoxia   SOB (shortness of breath)   Perforated gastric ulcer   Pelvic fluid collection  Plan today is to advance to clears, move with PT as able, move to step down unit, continue b-blocker added yesterday.  Will ask triad hospitalist team to assume care tomorrow.  Heber [place], MD [facility] Pager: [phone] Cell: [phone] If no response, call [phone]

## 2014-07-14 NOTE — Progress Notes (Signed)
IV team RN came to bedside to hang TNA. On assessment of chest xray IV team RN noticed that the central line was not in the SVC since placement. IV Team RN notified MD at eLink. MD stated to hold TNA and that at a later time CCM would come place new line. Contacted pharmacy forD10 maintenance fluid order. Stopped TNA and hung D10. Once new line is placed night shift RN will consult IV team to restart TNA. Will continue to assess and monitor the pt closely.

## 2014-07-14 NOTE — Progress Notes (Signed)
[facility] Progress Note  13 Days Post-Op  Subjective: Pt in mild pain, no N/V, in SDU.  Not mobilizing much.  NG fell out overnight, tolerating ice chips.  She's happy to start clears.  Had a large BM this am per patient.  Having flatus.    Objective: Vital signs in last 24 hours: Temp:  [97.7 F (36.5 C)-98.2 F (36.8 C)] 98.1 F (36.7 C) (04/12 0400) Resp:  [13-27] 20 (04/12 0700) BP: (101-141)/(50-90) 107/50 mmHg (04/12 0700) SpO2:  [93 %-100 %] 96 % (04/12 0700) Weight:  [85.1 kg (187 lb 9.8 oz)-90.6 kg (199 lb 11.8 oz)] 90.6 kg (199 lb 11.8 oz) (04/12 0500) Last BM Date: 07/14/14  Intake/Output from previous day: 04/11 0701 - 04/12 0700 In: 3134 [I.V.:240; IV Piggyback:408; WJX:9147]TPN:2436] Out: 2813 [Drains:40] Intake/Output this shift:    PE: Gen:  Alert, NAD, pleasant Card:  RRR, no M/G/R heard Pulm:  CTA, no W/R/R Abd: Soft, mildly tender to midline wound, +BS, no HSM, midline wound clean with good granulation, drain with minimal serous drainage   Lab Results:   Recent Labs  07/13/14 0440 07/14/14 0455  WBC 46.7* 12.9*  HGB 7.1* 7.3*  HCT 22.8* 23.2*  PLT 97* 99*   BMET  Recent Labs  07/13/14 1600 07/14/14 0455  NA 130* 130*  K 3.9 3.4*  CL 98 97  CO2 23 21  GLUCOSE 191* 102*  BUN 42* 68*  CREATININE 1.75* 2.63*  CALCIUM 7.9* 7.9*   PT/INR No results for input(s): LABPROT, INR in the last 72 hours. CMP     Component Value Date/Time   NA 130* 07/14/2014 0455   NA 139 10/02/2012 1046   K 3.4* 07/14/2014 0455   K 4.4 10/02/2012 1046   CL 97 07/14/2014 0455   CO2 21 07/14/2014 0455   CO2 25 10/02/2012 1046   GLUCOSE 102* 07/14/2014 0455   GLUCOSE 110 10/02/2012 1046   BUN 68* 07/14/2014 0455   BUN 35.2* 10/02/2012 1046   CREATININE 2.63* 07/14/2014 0455   CREATININE 2.5* 10/02/2012 1046   CALCIUM 7.9* 07/14/2014 0455   CALCIUM 9.8 10/02/2012 1046   PROT 5.8* 07/13/2014 0440   PROT 8.4* 10/02/2012 1046   ALBUMIN 1.6*  07/14/2014 0455   ALBUMIN 3.1* 10/02/2012 1046   AST 60* 07/13/2014 0440   AST 9 10/02/2012 1046   ALT 55* 07/13/2014 0440   ALT <6 Repeated and Verified 10/02/2012 1046   ALKPHOS 155* 07/13/2014 0440   ALKPHOS 108 10/02/2012 1046   BILITOT 1.9* 07/13/2014 0440   BILITOT 0.21 10/02/2012 1046   GFRNONAA 19* 07/14/2014 0455   GFRAA 22* 07/14/2014 0455   Lipase  No results found for: LIPASE     Studies/Results: Dg Abd 1 View  07/13/2014   CLINICAL DATA:  Ileus  EXAM: ABDOMEN - 1 VIEW  COMPARISON:  CT abdomen pelvis dated 07/09/2014  FINDINGS: Nonobstructive bowel gas pattern.  Contrast within distal small bowel in the right lower quadrant, ascending colon, and splenic flexure.  Pigtail drain in the right lower quadrant.  Enteric tube in the proximal gastric body.  IMPRESSION: Nonobstructive bowel gas pattern.  Residual contrast in the distal small bowel and colon, as above, unchanged from prior CT and suggesting adynamic colonic ileus.  Pigtail drain in the right lower quadrant.   Electronically Signed   By: Charline BillsSriyesh  Krishnan M.D.   On: 07/13/2014 12:48   Dg Chest Port 1 View  07/14/2014   CLINICAL DATA:  59 year old female  with left-sided pleural effusion. Shortness breath. Hypertension. Chronic kidney disease. Subsequent encounter.  EXAM: PORTABLE CHEST - 1 VIEW  COMPARISON:  07/12/2014.  FINDINGS: Right split catheter in place with tips in the region of the proximal and proximal to mid superior vena cava.  Left internal jugular catheter tip at the level of the distal left brachiocephalic vein just proximal to formation of the superior vena cava.  No gross pneumothorax.  Cardiomegaly.  Central pulmonary vascular prominence.  No segmental consolidation.  Minimally tortuous aorta.  IMPRESSION: Central pulmonary vascular prominence appears stable.  Nasogastric tube removed.  Cardiomegaly.   Electronically Signed   By: Lacy Duverney M.D.   On: 07/14/2014 07:29   Dg Chest Port 1 View  07/12/2014    CLINICAL DATA:  End-stage renal disease.  Pulmonary edema.  EXAM: PORTABLE CHEST - 1 VIEW  COMPARISON:  07/09/2014  FINDINGS: Unchanged position of dialysis catheter and left IJ catheter. There is a nasogastric tube with tip in the stomach. Stable cardiac enlargement. Vascular congestion is unchanged. The tip is in the projection of the aortic arch.  IMPRESSION: 1. Stable vascular congestion.   Electronically Signed   By: Signa Kell M.D.   On: 07/12/2014 10:53    Anti-infectives: Anti-infectives    Start     Dose/Rate Route Frequency Ordered Stop   07/13/14 2200  piperacillin-tazobactam (ZOSYN) IVPB 2.25 g     2.25 g 100 mL/hr over 30 Minutes Intravenous 3 times per day 07/13/14 1318     07/13/14 2000  fluconazole (DIFLUCAN) IVPB 200 mg     200 mg 100 mL/hr over 60 Minutes Intravenous Every 24 hours 07/13/14 1318     07/07/14 1200  vancomycin (VANCOCIN) IVPB 1000 mg/200 mL premix     1,000 mg 200 mL/hr over 60 Minutes Intravenous Every 24 hours 07/07/14 0937     07/06/14 2000  fluconazole (DIFLUCAN) IVPB 400 mg  Status:  Discontinued     400 mg 100 mL/hr over 120 Minutes Intravenous Every 24 hours 07/06/14 1507 07/13/14 1318   07/06/14 1800  piperacillin-tazobactam (ZOSYN) IVPB 2.25 g  Status:  Discontinued     2.25 g 100 mL/hr over 30 Minutes Intravenous 4 times per day 07/06/14 1505 07/13/14 1318   07/05/14 2000  fluconazole (DIFLUCAN) IVPB 200 mg  Status:  Discontinued     200 mg 100 mL/hr over 60 Minutes Intravenous Every 24 hours 07/05/14 0757 07/06/14 1507   07/05/14 1400  piperacillin-tazobactam (ZOSYN) IVPB 2.25 g  Status:  Discontinued     2.25 g 100 mL/hr over 30 Minutes Intravenous 3 times per day 07/05/14 0749 07/06/14 1505   07/02/14 1800  vancomycin (VANCOCIN) IVPB 1000 mg/200 mL premix  Status:  Discontinued     1,000 mg 200 mL/hr over 60 Minutes Intravenous Every 24 hours 07/01/14 1858 07/05/14 0801   07/01/14 2200  piperacillin-tazobactam (ZOSYN) IVPB 3.375 g   Status:  Discontinued     3.375 g 100 mL/hr over 30 Minutes Intravenous 4 times per day 07/01/14 1858 07/05/14 0749   07/01/14 2000  fluconazole (DIFLUCAN) IVPB 400 mg  Status:  Discontinued     400 mg 100 mL/hr over 120 Minutes Intravenous Every 24 hours 07/01/14 1858 07/05/14 0757   07/01/14 1400  fluconazole (DIFLUCAN) IVPB 200 mg  Status:  Discontinued     200 mg 100 mL/hr over 60 Minutes Intravenous Every 24 hours 07/01/14 1330 07/01/14 1853   07/01/14 1000  piperacillin-tazobactam (ZOSYN) IVPB 2.25 g  Status:  Discontinued     2.25 g 100 mL/hr over 30 Minutes Intravenous Every 8 hours 07/01/14 0952 07/01/14 1853   07/01/14 1000  vancomycin (VANCOCIN) 500 mg in sodium chloride 0.9 % 100 mL IVPB     500 mg 100 mL/hr over 60 Minutes Intravenous  Once 07/01/14 0952 07/01/14 1126   06/30/14 1200  vancomycin (VANCOCIN) IVPB 1000 mg/200 mL premix     1,000 mg 200 mL/hr over 60 Minutes Intravenous  Once 06/30/14 0944 06/30/14 1403   06/30/14 1200  ceFEPIme (MAXIPIME) 2 g in dextrose 5 % 50 mL IVPB     2 g 100 mL/hr over 30 Minutes Intravenous  Once 06/30/14 0944 06/30/14 1425   06/30/14 0100  vancomycin (VANCOCIN) 2,000 mg in sodium chloride 0.9 % 500 mL IVPB     2,000 mg 250 mL/hr over 120 Minutes Intravenous  Once 06/30/14 0048 06/30/14 0525   06/30/14 0100  ceFEPIme (MAXIPIME) 2 g in dextrose 5 % 50 mL IVPB     2 g 100 mL/hr over 30 Minutes Intravenous  Once 06/30/14 0048 06/30/14 0322   06/27/14 0600  cefUROXime (ZINACEF) 1.5 g in dextrose 5 % 50 mL IVPB     1.5 g 100 mL/hr over 30 Minutes Intravenous On call to O.R. 06/26/14 1019 06/27/14 0630   06/19/14 1800  oseltamivir (TAMIFLU) capsule 30 mg     30 mg Oral Every M-W-F (1800) 06/19/14 1003 06/22/14 1841   06/18/14 1600  oseltamivir (TAMIFLU) capsule 30 mg  Status:  Discontinued     30 mg Oral Daily 06/18/14 1538 06/19/14 1003       Assessment/Plan POD #13 s/p EXPLORATORY LAPAROTOMY WITH CLOSURE OF PERFORATED PYLORIC  ULCER, SMALL BOWEL RESECTION - 07/01/2014 - Derrell Lolling Post-operative ileus Vancomycin 3/29---> Cefepime 3/29--->3/30 Zosyn 3/30---> Fluconazole 3/30---> -NG was clamped yesterrday, but fell out over night, start clears, okay to start oral pain meds -Open wound - clean - dressing changes WD BID to midline wound, more often as needed for saturation -KUB non-obstructive bowel gas pattern, colinic ileus -Dulcolax prn -Needs to mobilize to aid in recovery -Use IS Intra-abdominal abscess -S/p perc drain placed - 40mL serous output Leukocytosis -WBC down to 12.9 - continues to improve Anemia - 7.3 - improved ESRD - Creat 2.63 -On CVVH PCM/TPN -On TPN, continue for now, maybe go to 1/2 dose tomorrow if tolerating clears and can advance to fulls Stroke - 06/19/2014 -Left hemiplegia - this will probably limit her mobility and return to normal activities Activity - PT ordered On Solucortef - Started 4/7 Disp - recommend PT/OT    LOS: 27 days    DORT, [name] 07/14/2014, 7:46 AM Pager: [phone]

## 2014-07-14 NOTE — Progress Notes (Signed)
On entering patient's room NG tube is laying on patient's pillow, completely out of her nose.Previously, at shift change bedside rounding NG was properly taped in nare. When questioned, patient is A & O x4 and states is was bothering her so she took it out. Patient does have bowel sounds and request to be placed on bedpan. NO BM but did pass gas. Dr Andrey CampanileWilson with surgery advised and okay to leave out.

## 2014-07-14 NOTE — Procedures (Signed)
5 min into treatment dropped BP to 80 systolic and then 45 min into treat got tachypneic and tachycardic to 170. 39cc of fluid total removed.  Whe I saw pt after blood rinsed back she appeared quite anxious and was hyperventilating some with an exam c/w that.  Will do dialysis in AM and give benadryl before and rinse dialyzer although I doubt dialyzer reaction. Nitta,Mariacristina Aday C

## 2014-07-14 NOTE — Progress Notes (Signed)
Referring Physician(s): CCS  Subjective:  Pelvic abscess drain placed 4/8 Intact and output good Pt seems some better  Allergies: Review of patient's allergies indicates no known allergies.  Medications: Prior to Admission medications   Medication Sig Start Date End Date Taking? Authorizing Provider  allopurinol (ZYLOPRIM) 100 MG tablet Take 100 mg by mouth 2 (two) times daily as needed (for gout).   Yes Historical Provider, MD  citalopram (CELEXA) 20 MG tablet Take 20 mg by mouth daily.   Yes Historical Provider, MD  furosemide (LASIX) 80 MG tablet Take 80 mg by mouth daily.   Yes Historical Provider, MD  hydrOXYzine (ATARAX/VISTARIL) 25 MG tablet Take 25 mg by mouth 3 (three) times daily as needed for anxiety.   Yes Historical Provider, MD  oxyCODONE (ROXICODONE) 5 MG immediate release tablet Take 1 tablet (5 mg total) by mouth every 6 (six) hours as needed for severe pain. Patient not taking: Reported on 06/17/2014 01/28/14   Raymond Gurney, PA-C     Vital Signs: BP 102/66 mmHg  Pulse 95  Temp(Src) 98 F (36.7 C) (Oral)  Resp 23  Ht  (1.676 m)  Wt 199 lb 11.8 oz (90.6 kg)  BMI 32.25 kg/m2  SpO2 99%  Physical Exam  Skin:  Site of Rt buttock drain placement is NT; no bleeding Output serous color 25 cc recorded, about 10cc in bulb now  Nursing note and vitals reviewed.   Imaging: Dg Abd 1 View  07/13/2014   CLINICAL DATA:  Ileus  EXAM: ABDOMEN - 1 VIEW  COMPARISON:  CT abdomen pelvis dated 07/09/2014  FINDINGS: Nonobstructive bowel gas pattern.  Contrast within distal small bowel in the right lower quadrant, ascending colon, and splenic flexure.  Pigtail drain in the right lower quadrant.  Enteric tube in the proximal gastric body.  IMPRESSION: Nonobstructive bowel gas pattern.  Residual contrast in the distal small bowel and colon, as above, unchanged from prior CT and suggesting adynamic colonic ileus.  Pigtail drain in the right lower quadrant.    Electronically Signed   By: Charline Bills M.D.   On: 07/13/2014 12:48   Dg Chest Port 1 View  07/14/2014   CLINICAL DATA:  59 year old female with left-sided pleural effusion. Shortness breath. Hypertension. Chronic kidney disease. Subsequent encounter.  EXAM: PORTABLE CHEST - 1 VIEW  COMPARISON:  07/12/2014.  FINDINGS: Right split catheter in place with tips in the region of the proximal and proximal to mid superior vena cava.  Left internal jugular catheter tip at the level of the distal left brachiocephalic vein just proximal to formation of the superior vena cava.  No gross pneumothorax.  Cardiomegaly.  Central pulmonary vascular prominence.  No segmental consolidation.  Minimally tortuous aorta.  IMPRESSION: Central pulmonary vascular prominence appears stable.  Nasogastric tube removed.  Cardiomegaly.   Electronically Signed   By: Lacy Duverney M.D.   On: 07/14/2014 07:29   Dg Chest Port 1 View  07/12/2014   CLINICAL DATA:  End-stage renal disease.  Pulmonary edema.  EXAM: PORTABLE CHEST - 1 VIEW  COMPARISON:  07/09/2014  FINDINGS: Unchanged position of dialysis catheter and left IJ catheter. There is a nasogastric tube with tip in the stomach. Stable cardiac enlargement. Vascular congestion is unchanged. The tip is in the projection of the aortic arch.  IMPRESSION: 1. Stable vascular congestion.   Electronically Signed   By: Signa Kell M.D.   On: 07/12/2014 10:53   Ct Image Guided Fluid Drain By  Catheter  07/10/2014   CLINICAL DATA:  Postop pelvic fluid collection concerning for abscess. sepsis despite broad-spectrum antibiotics  EXAM: CT GUIDED DRAINAGE OF POSTERIOR PELVIC ABSCESS ABSCESS  ANESTHESIA/SEDATION: 1.0 Mg IV Versed 50 mcg IV Fentanyl  Total Moderate Sedation Time:  15 minutes  PROCEDURE: The procedure, risks, benefits, and alternatives were explained to the patient. Questions regarding the procedure were encouraged and answered. The patient understands and consents to the  procedure.  The RIGHT TRANS GLUTEAL AREA was prepped with BETADINEin a sterile fashion, and a sterile drape was applied covering the operative field. A sterile gown and sterile gloves were used for the procedure. Local anesthesia was provided with 1% Lidocaine.  Previous imaging reviewed. Patient positioned prone. Noncontrast localization CT performed. The posterior pelvic fluid collection was localized. Under sterile conditions and local anesthesia, an 18 gauge 15 cm access needle was advanced from a medial trans gluteal approach into the collection. Needle position confirmed with CT. Guidewire advanced. Tract dilatation performed to advance a 10 JamaicaFrench drain. Retention loop formed in the collection. Syringe aspiration yielded 40 cc serosanguineous fluid. Sample sent for Gram stain and culture. Catheter secured with a Prolene suture and connected to external suction bulb. Sterile dressing applied.  COMPLICATIONS: None immediate  FINDINGS: Imaging confirms needle access into the pelvic fluid collection for drain insertion  IMPRESSION: Successful CT-guided pelvic abscess drain insertion   Electronically Signed   By: Judie PetitM.  Shick M.D.   On: 07/10/2014 17:07    Labs:  CBC:  Recent Labs  07/11/14 0557 07/12/14 0430 07/13/14 0440 07/14/14 0455  WBC 23.0* 20.6* 46.7* 12.9*  HGB 7.1* 7.0* 7.1* 7.3*  HCT 21.7* 21.7* 22.8* 23.2*  PLT 130* 108* 97* 99*    COAGS:  Recent Labs  07/01/14 1445 07/01/14 1813 07/02/14 0430 07/03/14 0445 07/04/14 0510 07/07/14 0400 07/10/14 0715  INR 1.44 1.83* 1.51*  --   --   --  1.21  APTT  --   --  35 38* 36 41* 37    BMP:  Recent Labs  07/12/14 1615 07/13/14 0440 07/13/14 1600 07/14/14 0455  NA 128* 131* 130* 130*  K 4.1 4.0 3.9 3.4*  CL 96 100 98 97  CO2 22 26 23 21   GLUCOSE 140* 133* 191* 102*  BUN 39* 44* 42* 68*  CALCIUM 7.8* 8.0* 7.9* 7.9*  CREATININE 1.58* 1.81* 1.75* 2.63*  GFRNONAA 35* 30* 31* 19*  GFRAA 41* 34* 36* 22*    LIVER  FUNCTION TESTS:  Recent Labs  07/03/14 0445  07/06/14 0552  07/09/14 0430  07/12/14 1615 07/13/14 0440 07/13/14 1600 07/14/14 0455  BILITOT 1.8*  --  1.9*  --  2.0*  --   --  1.9*  --   --   AST 231*  --  25  --  17  --   --  60*  --   --   ALT 302*  --  69*  --  29  --   --  55*  --   --   ALKPHOS 78  --  75  --  111  --   --  155*  --   --   PROT 5.3*  --  4.5*  --  4.9*  --   --  5.8*  --   --   ALBUMIN 2.0*  < > 1.4*  < > 1.2*  < > 1.7* 1.7* 1.8* 1.6*  < > = values in this interval not displayed.  Assessment and  Plan:  Rt TG drain placed 4/9, intact with good function Output starting to trend down. Will follow. Recommend repeat CT in next 2-3 days if output continues to trend down.  SignedBrayton El 07/14/2014, 10:32 AM   I spent a total of 15 Minutes in face to face in clinical consultation/evaluation, greater than 50% of which was counseling/coordinating care for TG drain

## 2014-07-14 NOTE — Progress Notes (Signed)
PARENTERAL NUTRITION CONSULT NOTE - FOLLOW UP  Pharmacy Consult:  TPN Indication:  Prolonged ileus  No Known Allergies   Patient Measurements: Height:  (167.6 cm) Weight: 199 lb 11.8 oz (90.6 kg) IBW/kg (Calculated) : 59.3  Adjusted body weight: 67 kg  Vital Signs: Temp: 98.1 F (36.7 C) (04/12 0400) Temp Source: Oral (04/12 0400) BP: 107/50 mmHg (04/12 0700) Intake/Output from previous day: 04/11 0701 - 04/12 0700 In: 3134 [I.V.:240; IV Piggyback:408; TPN:2436] Out: 2813 [Drains:40]  Labs:  Recent Labs  07/12/14 0430 07/13/14 0440 07/14/14 0455  WBC 20.6* 46.7* 12.9*  HGB 7.0* 7.1* 7.3*  HCT 21.7* 22.8* 23.2*  PLT 108* 97* PENDING     Recent Labs  07/12/14 0430  07/13/14 0440 07/13/14 1600 07/14/14 0455  NA 129*  < > 131* 130* 130*  K 3.6  < > 4.0 3.9 3.4*  CL 96  < > 100 98 97  CO2 25  < > GLUCOSE 195*  < > 133* 191* 102*  BUN 42*  < > 44* 42* 68*  CREATININE 1.70*  < > 1.81* 1.75* 2.63*  CALCIUM 8.0*  < > 8.0* 7.9* 7.9*  MG 2.2  --  2.1  --  2.0  PHOS 2.0*  < > 1.7* 3.2 2.6  PROT  --   --  5.8*  --   --   ALBUMIN 1.6*  < > 1.7* 1.8* 1.6*  AST  --   --  60*  --   --   ALT  --   --  55*  --   --   ALKPHOS  --   --  155*  --   --   BILITOT  --   --  1.9*  --   --   < > = values in this interval not displayed. Estimated Creatinine Clearance: 26.4 mL/min (by C-G formula based on Cr of 2.63).    Recent Labs  07/13/14 2119 07/14/14 07/14/14 0457  GLUCAP 135* 86 110*   Insulin Requirements in the past 24 hours:  11 units moderate SSI + 15 units regular insulin in TPN  Current Nutrition:  Clear liquid diet Clinimix 5/15 (no electrolytes) to 83 ml/hr and continue IVFE 20% at 10 ml/hr.  TPN will provide 1894 kCal and 100gm of protein per day, meeting 86% of minimal kCal and 83% of minimal protein needs  Nutritional Goals: 2200-2600 kCal, 120-140 grams of protein per day  Assessment: 89 YOF with complicated hospital course. S/p  closure perforated pyloric ulcer, SBR for ischemic necrosis, diffuse peritonitis on 07/01/14. Pharmacy consulted to provide TPN for nutrition support.   GI: Ileus, on bowel rest.  Albumin 1.6. Prealbumin from 4/1 is 5, increased to 10 on 4/4.  Continued drain output.  +flatus >> KUB suggested dynamic colonic ileus 4/8: placed pelvic abscess drain, 40 cc aspirated over last 24 hours  Endo: no hx DM - CBGs ok with SSI + insulin in TPN (80-130s).  Tapering SM further  Lytes: none in TPN while on CRRT - Na 130, K 3.4 (goal >4 for ileus), Phos 2.6 (replaced 2 days prior), Mg 2, CoCa 9.8  Renal: gout / ESRD - CRRT 4/4 >> 4/11, plan is to start iHD 4/11 - SCr up to 2.63, BUN up to 68. Renal requested pharmacy to maximally concentrate TPN (Unable to maximize anymore than Clinimix 5/15). MIVF: NS @ 8ml/hr. I/Os net positive over last 24 hours  Pulm: asthma - extubated 4/2,  decreased to RA - Atrovent/Xopenex nebs  Cards: HTN - VSS  Hepatotobil: LFTs/tbili trending up, TG WNL  Neuro: hx depression / insomnia - new stroke noted on 3/18.   ID: Day #14 of vanc/zosyn/diflucan for peritonitis. S/p OR for perforated pyloric ulcer, evacuation of ascites and small bowel resection for ischemic necrosis.  Afebrile, WBC down to 12.9. 4/8 placed perc drain of pelvic abscess  Vanc 3/29 >> Cefepime 3/29 >> 3/30 Zosyn 3/30 >> Fluc 3/20 >>  3/30 peritoneal fluid - rare lactobacillus (no sensitivies performed; usually sensitive to PCN) 3/28 blood x2 - ngtd 3/36 C diff - neg  Best Practices: SCDs, PPI IV  TPN Access: CVC  TPN day#: 12  Plan:  - Continue Clinimix 5/15 (no electrolytes) 83 ml/hr and continue IVFE 20% at 10 ml/hr per renals request.  TPN will provide 1894 kCal and 100gm of protein per day, meeting 86% of minimal kCal and 83% of minimal protein needs. - Continue moderate SSI and decrease insulin in TPN to 10 units  - Daily multivitamin and trace elements in TPN - Give K 10mEq IV x 4  runs - F/U renal function panel, volume/respiratory status while on iHD, PO intake  Enzo BiNathan Tymel Conely, PharmD Clinical Pharmacist Resident Pager [phone] 07/14/2014 7:55 AM

## 2014-07-14 NOTE — Procedures (Signed)
Central Venous Catheter Insertion Procedure Note [name] [id number] [birth year]  Procedure: Insertion of Central Venous Catheter Indications: Drug and/or fluid administration  Procedure Details Consent: Risks of procedure as well as the alternatives and risks of each were explained to the (patient/caregiver).  Consent for procedure obtained. Time Out: Verified patient identification, verified procedure, site/side was marked, verified correct patient position, special equipment/implants available, medications/allergies/relevent history reviewed, required imaging and test results available.  Performed  Maximum sterile technique was used including antiseptics, cap, gloves, gown, hand hygiene, mask and sheet. Area and existing catheter were fully prepped with chlorhexidine, Draped, prepped again. Wire placed into distal port of existing catheter. CVL removed over wire.  A antimicrobial bonded/coated triple lumen catheter was placed in the left internal jugular vein using the Seldinger technique. Ultrasound guidance used.Yes.   Catheter placed to 22 cm. Blood aspirated via all 3 ports and then flushed x 3. Line sutured x 2 and dressing applied.  Evaluation Blood flow good Complications: No apparent complications Patient did tolerate procedure well. Chest X-ray ordered to verify placement.  CXR: pending.  Joneen RoachPaul Hoffman, AGACNP-BC [facility] Pager [phone] or [phone]  07/14/2014 9:08 PM

## 2014-07-14 NOTE — Progress Notes (Signed)
Assessment: 1. Perforated pyloric ulcer / necrotic small bowel - s/p repair of perf ulcer w partial ileum resection 3/30 2. Recurrent sepsis - improving s/p perc drainage of pelvic fluid collection; on vanc/ zosyn/ diflucan 3. S/P acute ACA stroke / left hemiparesis 4. Vol excess - improving 5. Nutrition / ileus - remains on TNA, hypokalemic 6. ESRD new start to HD 7. Anemia ESA started, IV Fe, may need PRBCs 8. 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 No binders d/t low phos 9. S/P influenza H1N1 10. 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 HD access - Permcath placed 3/26 d/t AVF problems; when stable VVS wants to considering revision of AVF  Plan - Will convert to intermittent HD today, first HD Tuesday(today)  Subjective: Interval History: Off CRRT  Objective: Vital signs in last 24 hours: Temp:  [97.7 F (36.5 C)-98.2 F (36.8 C)] 98 F (36.7 C) (04/12 0800) Resp:  [13-27] 19 (04/12 0800) BP: (101-141)/(50-90) 116/66 mmHg (04/12 0800) SpO2:  [93 %-100 %] 97 % (04/12 0800) Weight:  [85.1 kg (187 lb 9.8 oz)-90.6 kg (199 lb 11.8 oz)] 90.6 kg (199 lb 11.8 oz) (04/12 0500) Weight change: -5.6 kg (-12 lb 5.5 oz)  Intake/Output from previous day: 04/11 0701 - 04/12 0700 In: 3134 [I.V.:240; IV Piggyback:408; TPN:2436] Out: 2813 [Drains:40] Intake/Output this shift: Total I/O In: 256 [I.V.:20; IV Piggyback:50; TPN:186] Out: -   General appearance: alert and cooperative Resp: clear to auscultation bilaterally Breasts: normal appearance, no masses or tenderness Extremities: extremities normal, atraumatic, no cyanosis or edema  AVF LUE thrill  Lab Results:  Recent Labs  07/13/14 0440 07/14/14 0455  WBC 46.7* 12.9*  HGB 7.1* 7.3*  HCT 22.8* 23.2*  PLT 97* 99*   BMET:  Recent Labs  07/13/14 1600 07/14/14 0455  NA 130* 130*  K 3.9 3.4*  CL 98 97  CO2 23 21  GLUCOSE 191* 102*  BUN 42* 68*  CREATININE 1.75* 2.63*  CALCIUM 7.9* 7.9*   No results for input(s): PTH in the last 72  hours. Iron Studies: No results for input(s): IRON, TIBC, TRANSFERRIN, FERRITIN in the last 72 hours. Studies/Results: Dg Abd 1 View  07/13/2014   CLINICAL DATA:  Ileus  EXAM: ABDOMEN - 1 VIEW  COMPARISON:  CT abdomen pelvis dated 07/09/2014  FINDINGS: Nonobstructive bowel gas pattern.  Contrast within distal small bowel in the right lower quadrant, ascending colon, and splenic flexure.  Pigtail drain in the right lower quadrant.  Enteric tube in the proximal gastric body.  IMPRESSION: Nonobstructive bowel gas pattern.  Residual contrast in the distal small bowel and colon, as above, unchanged from prior CT and suggesting adynamic colonic ileus.  Pigtail drain in the right lower quadrant.   Electronically Signed   By: Charline Bills M.D.   On: 07/13/2014 12:48   Dg Chest Port 1 View  07/14/2014   CLINICAL DATA:  59 year old female with left-sided pleural effusion. Shortness breath. Hypertension. Chronic kidney disease. Subsequent encounter.  EXAM: PORTABLE CHEST - 1 VIEW  COMPARISON:  07/12/2014.  FINDINGS: Right split catheter in place with tips in the region of the proximal and proximal to mid superior vena cava.  Left internal jugular catheter tip at the level of the distal left brachiocephalic vein just proximal to formation of the superior vena cava.  No gross pneumothorax.  Cardiomegaly.  Central pulmonary vascular prominence.  No segmental consolidation.  Minimally tortuous aorta.  IMPRESSION: Central pulmonary vascular prominence appears stable.  Nasogastric tube removed.  Cardiomegaly.  Electronically Signed   By: Lacy DuverneySteven  Olson M.D.   On: 07/14/2014 07:29   Dg Chest Port 1 View  07/12/2014   CLINICAL DATA:  End-stage renal disease.  Pulmonary edema.  EXAM: PORTABLE CHEST - 1 VIEW  COMPARISON:  07/09/2014  FINDINGS: Unchanged position of dialysis catheter and left IJ catheter. There is a nasogastric tube with tip in the stomach. Stable cardiac enlargement. Vascular congestion is unchanged. The  tip is in the projection of the aortic arch.  IMPRESSION: 1. Stable vascular congestion.   Electronically Signed   By: Signa Kellaylor  Stroud M.D.   On: 07/12/2014 10:53   Scheduled: . sodium chloride   Intravenous Once  . darbepoetin (ARANESP) injection - DIALYSIS  150 mcg Intravenous Q Sat-HD  . fluconazole (DIFLUCAN) IV  200 mg Intravenous Q24H  . hydrocortisone sodium succinate  50 mg Intravenous Daily  . insulin aspart  0-15 Units Subcutaneous 6 times per day  . ipratropium  0.5 mg Nebulization Q6H  . levalbuterol  0.63 mg Nebulization Q6H  . metoprolol  2.5 mg Intravenous 4 times per day  . pantoprazole (PROTONIX) IV  40 mg Intravenous Q12H  . piperacillin-tazobactam (ZOSYN)  IV  2.25 g Intravenous 3 times per day  . vancomycin  1,000 mg Intravenous Q24H     LOS: 27 days   Shukla,Devony Mcgrady C 07/14/2014,8:23 AM

## 2014-07-14 NOTE — Progress Notes (Signed)
At bedside to hang TNA. Confirmation of CVC per radiology determines tip at level of Left and right brachiocephalic junction. Dr. Levada SchillingSummers notified, to replace central line. Floor RN aware.

## 2014-07-14 NOTE — Procedures (Signed)
Pt became hypotensive during first five min of hd tx, uf off immediately. 45 min into tx became tachycardic 170"s, hd tx stopped immediately and blood returned, upon blood return pulse decreased to low 100's, SOB, o2 via [place] 684ml/min.  Dr. Lowell GuitarPowell notified and orders to keep pt in 2H09 and try again tomorrow, reported and communicated these orders to Dois DavenportSandra, [place]RN.

## 2014-07-15 ENCOUNTER — Encounter: Payer: Self-pay | Admitting: Vascular Surgery

## 2014-07-15 LAB — RENAL FUNCTION PANEL
Albumin: 1.6 g/dL — ABNORMAL LOW (ref 3.5–5.2)
Anion gap: 14 (ref 5–15)
BUN: 81 mg/dL — AB (ref 6–23)
CALCIUM: 7.9 mg/dL — AB (ref 8.4–10.5)
CO2: 18 mmol/L — AB (ref 19–32)
Chloride: 93 mmol/L — ABNORMAL LOW (ref 96–112)
Creatinine, Ser: 3.6 mg/dL — ABNORMAL HIGH (ref 0.50–1.10)
GFR calc Af Amer: 15 mL/min — ABNORMAL LOW (ref >90)
GFR calc non Af Amer: 13 mL/min — ABNORMAL LOW (ref >90)
GLUCOSE: 126 mg/dL — AB (ref 70–99)
PHOSPHORUS: 3.7 mg/dL (ref 2.3–4.6)
Potassium: 3.9 mmol/L (ref 3.5–5.1)
Sodium: 125 mmol/L — ABNORMAL LOW (ref 135–145)

## 2014-07-15 LAB — GLUCOSE, CAPILLARY
GLUCOSE-CAPILLARY: 71 mg/dL (ref 70–99)
GLUCOSE-CAPILLARY: 154 mg/dL — AB (ref 70–99)
GLUCOSE-CAPILLARY: 138 mg/dL — AB (ref 70–99)
Glucose-Capillary: 162 mg/dL — ABNORMAL HIGH (ref 70–99)
Glucose-Capillary: 114 mg/dL — ABNORMAL HIGH (ref 70–99)
Glucose-Capillary: 219 mg/dL — ABNORMAL HIGH (ref 70–99)

## 2014-07-15 LAB — CBC WITH DIFFERENTIAL/PLATELET
BASOS ABS: 0 10*3/uL (ref 0.0–0.1)
BASOS PCT: 0 % (ref 0–1)
Eosinophils Absolute: 0.5 10*3/uL (ref 0.0–0.7)
Eosinophils Relative: 4 % (ref 0–5)
HEMATOCRIT: 22.3 % — AB (ref 36.0–46.0)
HEMOGLOBIN: 7.1 g/dL — AB (ref 12.0–15.0)
LYMPHS PCT: 8 % — AB (ref 12–46)
Lymphs Abs: 1.1 10*3/uL (ref 0.7–4.0)
MCH: 28 pg (ref 26.0–34.0)
MCHC: 31.8 g/dL (ref 30.0–36.0)
MCV: 87.8 fL (ref 78.0–100.0)
MONOS PCT: 8 % (ref 3–12)
Monocytes Absolute: 1.1 10*3/uL — ABNORMAL HIGH (ref 0.1–1.0)
Neutro Abs: 10.7 10*3/uL — ABNORMAL HIGH (ref 1.7–7.7)
Neutrophils Relative %: 80 % — ABNORMAL HIGH (ref 43–77)
Platelets: 79 10*3/uL — ABNORMAL LOW (ref 150–400)
RBC: 2.54 MIL/uL — AB (ref 3.87–5.11)
RDW: 24.4 % — AB (ref 11.5–15.5)
WBC: 13.4 10*3/uL — AB (ref 4.0–10.5)

## 2014-07-15 LAB — ANAEROBIC CULTURE: Special Requests: NORMAL

## 2014-07-15 LAB — PREPARE RBC (CROSSMATCH)

## 2014-07-15 LAB — CLOSTRIDIUM DIFFICILE BY PCR: CDIFFPCR: POSITIVE — AB

## 2014-07-15 LAB — MAGNESIUM: Magnesium: 1.8 mg/dL (ref 1.5–2.5)

## 2014-07-15 MED ORDER — DIPHENHYDRAMINE HCL 50 MG/ML IJ SOLN
25.0000 mg | Freq: Once | INTRAMUSCULAR | Status: AC
  Administered 2014-07-15: 25 mg via INTRAVENOUS

## 2014-07-15 MED ORDER — SODIUM CHLORIDE 0.9 % IV SOLN: Freq: Once | INTRAVENOUS | Status: DC

## 2014-07-15 MED ORDER — TRACE MINERALS CR-CU-F-FE-I-MN-MO-SE-ZN IV SOLN
INTRAVENOUS | Status: DC
  Administered 2014-07-15: 17:00:00 via INTRAVENOUS
  Filled 2014-07-15: qty 1992

## 2014-07-15 MED ORDER — INSULIN ASPART 100 UNIT/ML ~~LOC~~ SOLN
0.0000 [IU] | SUBCUTANEOUS | Status: DC
  Administered 2014-07-15: 3 [IU] via SUBCUTANEOUS
  Administered 2014-07-15: 2 [IU] via SUBCUTANEOUS
  Administered 2014-07-15 – 2014-07-16 (×3): 1 [IU] via SUBCUTANEOUS

## 2014-07-15 MED ORDER — ZOLPIDEM TARTRATE 5 MG PO TABS
5.0000 mg | ORAL_TABLET | Freq: Once | ORAL | Status: AC
  Administered 2014-07-15: 5 mg via ORAL
  Filled 2014-07-15: qty 1

## 2014-07-15 MED ORDER — DIPHENHYDRAMINE HCL 50 MG/ML IJ SOLN
INTRAMUSCULAR | Status: AC
  Administered 2014-07-15: 25 mg via INTRAVENOUS
  Filled 2014-07-15: qty 1

## 2014-07-15 MED ORDER — FAT EMULSION 20 % IV EMUL
240.0000 mL | INTRAVENOUS | Status: DC
  Administered 2014-07-15: 240 mL via INTRAVENOUS
  Filled 2014-07-15: qty 250

## 2014-07-15 MED ORDER — METRONIDAZOLE 500 MG PO TABS
500.0000 mg | ORAL_TABLET | Freq: Three times a day (TID) | ORAL | Status: DC
  Administered 2014-07-15 – 2014-07-18 (×10): 500 mg via ORAL
  Filled 2014-07-15 (×15): qty 1

## 2014-07-15 NOTE — Progress Notes (Signed)
eLink Physician-Brief Progress Note Patient Name: [name] DOB: 1955-08-13 MRN: [id number]   Date of Service  07/15/2014  HPI/Events of Note  insomnia  eICU Interventions  ambien     Intervention Category Minor Interventions: Routine modifications to care plan (e.g. PRN medications for pain, fever)  Nelda BucksFEINSTEIN,Taimur Fier J. 07/15/2014, 3:39 AM

## 2014-07-15 NOTE — Evaluation (Signed)
Physical Therapy Evaluation Patient Details Name: [name] MRN: [id number] DOB: [birth year] Today's Date: 07/15/2014   History of Present Illness  [name] is a 59 y.o. female with a history of CKD and hypertension came [facility] the [facility] ED on 3/16 complaining of shortness of breath and generalized weakness x 2 weeks. Admitted with acute hypoxemic respiratory failure. During hospital stay noted to have weakness of her left side. 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  She developed septic shock 4/1 with pressors weaned off 4/3.  Began CRRT and pressors restarted 4/4; Developed pelvic abcess 4/7 with pelvic drain placed 4/8.  weaned off pressors 4/9.  Runs of SVT 4/5 and 4/11  Clinical Impression  Patient participating this session in sitting tolerance and trunk mobility/control.  She continues to need skilled PT to address issues noted in PT problem list below.  Feel she will progress with extended time to rehab due to multiple co-morbidities so recommend LTACH for rehab at this time.  Will follow acutely.    Follow Up Recommendations LTACH;Supervision/Assistance - 24 hour    Equipment Recommendations  Other (comment) (TBA)    Recommendations for Other Services       Precautions / Restrictions Precautions Precautions: Fall Precaution Comments: multiple lines       Mobility  Bed Mobility Overal bed mobility: Needs Assistance;+2 for physical assistance Bed Mobility: Rolling;Supine to Sit;Sit to Supine Rolling: Max assist;+2 for physical assistance   Supine to sit: Max assist;+2 for physical assistance Sit to supine: +2 for physical assistance;Max assist   General bed mobility comments: utilizes [name]/LE to assist with rolling and pushing up to sit  Transfers                 General transfer comment: NT today due to fatigue/weakness focus  of session on trunk control and endurance in sitting  Ambulation/Gait                Stairs            Wheelchair Mobility    Modified Rankin (Stroke Patients Only)       Balance Overall balance assessment: Needs assistance Sitting-balance support: Feet supported Sitting balance-Leahy Scale: Poor Sitting balance - Comments: Pt required max A/facilitation to min A/facilitaion.  Pt with heavy Lt lean due to hemiplegia.  worked on facilitation of trunk control and balance while pt EOB.  Facilitation provided for head/neck extension; thoracic and lumbar flex/ext; rotation to Rt  Postural control: Left lateral lean                                   Pertinent Vitals/Pain Pain Assessment: Faces Faces Pain Scale: Hurts even more Pain Location: left ribs Pain Descriptors / Indicators: Aching Pain Intervention(s): Monitored during session;Limited activity within patient's tolerance    Home Living Family/patient expects to be discharged to:: Unsure Living Arrangements: Children Available Help at Discharge: Family Type of Home: Apartment Home Access: Stairs to enter   Secretary/administrator of Steps: 12 Home Layout: Able to live on main level with bedroom/bathroom Home Equipment: None Additional Comments: tub shower, standard height toilets    Prior Function Level of Independence: Independent               Hand Dominance   Dominant  Hand: Right    Extremity/Trunk Assessment   Upper Extremity Assessment: Defer to OT evaluation       LUE Deficits / Details: Pt with minimal shoulder elevation, scapular depression and adduction; forearm sup/pron 3/5; finger flex/ext 2+/5   Lower Extremity Assessment: Defer to PT evaluation   LLE Deficits / Details: unable to activate left LE for motor function in supine; supported weight some with left LE sitting  Cervical / Trunk Assessment: Other exceptions  Communication   Communication: No difficulties   Cognition Arousal/Alertness: Awake/alert Behavior During Therapy: WFL for tasks assessed/performed Overall Cognitive Status: Impaired/Different from baseline Area of Impairment: Attention   Current Attention Level: Selective           General Comments: Pt able to follow one and two step commands consistently.   Cognition appears [facility]FL for basic tasks     General Comments General comments (skin integrity, edema, etc.): VSS throughout session    Exercises        Assessment/Plan    PT Assessment Patient needs continued PT services  PT Diagnosis Hemiplegia non-dominant side;Generalized weakness   PT Problem List Decreased strength;Decreased mobility;Decreased coordination;Impaired tone;Decreased activity tolerance;Decreased balance;Impaired sensation  PT Treatment Interventions DME instruction;Gait training;Stair training;Functional mobility training;Therapeutic activities;Therapeutic exercise;Balance training;Neuromuscular re-education;Patient/family education   PT Goals (Current goals can be found in the Care Plan section) Acute Rehab PT Goals Patient Stated Goal: to get better  PT Goal Formulation: With patient Time For Goal Achievement: 07/29/14 Potential to Achieve Goals: Good    Frequency Min 3X/week   Barriers to discharge        Co-evaluation PT/OT/SLP Co-Evaluation/Treatment: Yes Reason for Co-Treatment: Complexity of the patient's impairments (multi-system involvement) PT goals addressed during session: Balance;Mobility/safety with mobility OT goals addressed during session: Strengthening/ROM       End of Session   Activity Tolerance: Patient limited by fatigue Patient left: in bed;with call bell/phone within reach           Time: [id number] PT Time Calculation (min) (ACUTE ONLY): 38 min   Charges:   PT Evaluation $PT Re-evaluation: 1 Procedure PT Treatments $Therapeutic Activity: 8-22 mins   PT G Codes:        Ameriah Lint,CYNDI 07/15/2014, 5:11 PM   Sheran Lawlessyndi Prue Lingenfelter, PT [phone] 07/15/2014

## 2014-07-15 NOTE — Progress Notes (Signed)
PARENTERAL NUTRITION CONSULT NOTE - FOLLOW UP  Pharmacy Consult:  TPN Indication:  Prolonged ileus  No Known Allergies   Patient Measurements: Height: 5\' 6"  (167.6 cm) Weight: 209 lb 14.1 oz (95.2 kg) IBW/kg (Calculated) : 59.3  Adjusted body weight: 67 kg  Vital Signs: Temp: 97.5 F (36.4 C) (04/13 0359) Temp Source: Oral (04/13 0359) BP: 125/92 mmHg (04/13 0500) Pulse Rate: 103 (04/13 0200) Intake/Output from previous day: 04/12 0701 - 04/13 0700 In: 3018.2 [P.O.:600; I.V.:120; IV Piggyback:650; ZOX:0960.4]TPN:1638.2] Out: 25 [Drains:25]  Labs:  Recent Labs  07/13/14 0440 07/14/14 0455 07/15/14 0440  WBC 46.7* 12.9* 13.4*  HGB 7.1* 7.3* 7.1*  HCT 22.8* 23.2* 22.3*  PLT 97* 99* PENDING     Recent Labs  07/13/14 0440 07/13/14 1600 07/14/14 0455 07/15/14 0440  NA 131* 130* 130* 125*  K 4.0 3.9 3.4* 3.9  CL 100 98 97 93*  CO2 26 23 21  18*  GLUCOSE 133* 191* 102* 126*  BUN 44* 42* 68* 81*  CREATININE 1.81* 1.75* 2.63* 3.60*  CALCIUM 8.0* 7.9* 7.9* 7.9*  MG 2.1  --  2.0 1.8  PHOS 1.7* 3.2 2.6 3.7  PROT 5.8*  --   --   --   ALBUMIN 1.7* 1.8* 1.6* 1.6*  AST 60*  --   --   --   ALT 55*  --   --   --   ALKPHOS 155*  --   --   --   BILITOT 1.9*  --   --   --    Estimated Creatinine Clearance: 19.8 mL/min (by C-G formula based on Cr of 3.6).    Recent Labs  07/14/14 2024 07/14/14 2324 07/15/14 0351  GLUCAP 128* 135* 162*   Insulin Requirements in the past 24 hours:  12 units moderate SSI + 10 units regular insulin in TPN  Current Nutrition:  Clear liquid diet Clinimix 5/15 (no electrolytes) to 83 ml/hr and continue IVFE 20% at 10 ml/hr.  TPN will provide 1894 kCal and 100gm of protein per day, meeting 86% of minimal kCal and 83% of minimal protein needs.   Nutritional Goals: 2200-2600 kCal, 120-140 grams of protein per day  Assessment: [name] with complicated hospital course. S/p closure perforated pyloric ulcer, SBR for ischemic necrosis, diffuse  peritonitis on 07/01/14. Pharmacy consulted to provide TPN for nutrition support.   GI: Ileus, on bowel rest.  Albumin 1.6. Prealbumin from 4/1 is 5, increased to 10 on 4/4.  NG tube removed and patient had BM, +flatus. KUB suggested dynamic colonic ileus. TNA delayed a little overnight due to changing of the central line. PT seems eager to advance diet, intake reported as 75%.  Endo: no hx DM - CBGs ok with SSI + insulin in TPN (120-180s).  Tapering SM further  Lytes: none in TPN while on iHD - Na 125, Cl 93 (possibly due to dilutional effect and D10 hanging last night), K 3.9 (goal >4 for ileus), Phos 3.7, Mg 1.8, CoCa 9.8  Renal: gout / ESRD - CRRT 4/4 >> 4/11, iHD 4/12 >> - Pt only received 45 min of HD on 4/12 due to BP drop and tachypnea/tachycardic. Plan to try and dialyze again today. SCr up to 3.6, BUN up to 81. Renal requested pharmacy to maximally concentrate TPN (Unable to maximize anymore than Clinimix 5/15). MIVF: NS @ 6710ml/hr. I/Os net positive 2.9 L over last 24 hours  Pulm: asthma - extubated 4/2, decreased to RA - Atrovent/Xopenex nebs  Cards: HTN -  HR tachy, BP ok.  Hepatotobil: LFTs/tbili trending up, TG WNL  Neuro: hx depression / insomnia - new stroke noted on 3/18.   ID: Day #15 of vanc/zosyn/diflucan for peritonitis. S/p OR for perforated pyloric ulcer, evacuation of ascites and small bowel resection for ischemic necrosis.  Afebrile, WBC slightly elevated at 12.9. 4/8 placed perc drain of pelvic abscess  Vanc 3/29 >> Cefepime 3/29 >> 3/30 Zosyn 3/30 >> Fluc 3/20 >>  3/30 peritoneal fluid - rare lactobacillus (no sensitivies performed; usually sensitive to PCN) 3/28 blood x2 - ngtd 3/36 C diff - neg  Best Practices: SCDs, PPI IV  TPN Access: CVC  TPN day#: 13  Plan:  - Continue Clinimix 5/15 (no electrolytes) 83 ml/hr and continue IVFE 20% at 10 ml/hr per renals request.  TPN will provide 1894 kCal and 100gm of protein per day, meeting 86% of minimal kCal and  83% of minimal protein needs. - Change SSI to sensitive and insulin in TPN to 10 units  - Daily multivitamin and trace elements in TPN - Consider decreasing TPN tomorrow if diet advances - F/U renal function panel, volume/respiratory status while on iHD, PO intake  Enzo Bi, PharmD Clinical Pharmacist Resident Pager [phone] 07/15/2014 7:20 AM

## 2014-07-15 NOTE — Evaluation (Signed)
Occupational Therapy Evaluation Patient Details Name: [name] MRN: [id number] DOB: [birth year] Today's Date: 07/15/2014    History of Present Illness [name] is a 59 y.o. female with a history of CKD and hypertension came to the [facility] ED on 3/16 complaining of shortness of breath and generalized weakness x 2 weeks. Admitted with acute hypoxemic respiratory failure. During hospital stay noted to have weakness of her left side. 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  She developed septic shock 4/1 with pressors weaned off 4/3.  Began CRRT and pressors restarted 4/4; Developed pelvic abcess 4/7 with pelvic drain placed 4/8.  weaned off pressors 4/9.  Runs of SVT 4/5 and 4/11   Clinical Impression   Pt admitted with above. She demonstrates the below listed deficits and will benefit from continued OT to maximize safety and independence with BADLs.  Pt presents to OT with Lt hemiparesis; impaired trunk control and balance, impaired functional mobility, generalized weakness/deconditioning.  Difficult to determine appropriate discharge destination at this time due to complexity of medical issues.   Currently, she requires max - total A for ADLs.  She will need 24 hour assist at discharge and extensive rehab.  Will follow acutely.       Follow Up Recommendations  LTACH    Equipment Recommendations  None recommended by OT    Recommendations for Other Services       Precautions / Restrictions Precautions Precautions: Fall Precaution Comments: multiple lines       Mobility Bed Mobility Overal bed mobility: Needs Assistance;+2 for physical assistance Bed Mobility: Rolling;Supine to Sit;Sit to Supine Rolling: Max assist;+2 for physical assistance   Supine to sit: Max assist;+2 for physical assistance Sit to supine: +2 for physical assistance;Max  assist   General bed mobility comments: Pt requires assist to move LEs off of and onto bed, and assist to lift trunk from bed.  Mod verbal cues for technique and sequencing   Transfers                 General transfer comment: Did not attempt due ot pt fatigue    Balance Overall balance assessment: Needs assistance Sitting-balance support: Feet supported Sitting balance-Leahy Scale: Poor Sitting balance - Comments: Pt required max A/facilitation to min A/facilitaion.  Pt with heavy Lt lean due to hemiplegia.  worked on facilitation of trunk control and balance while pt EOB.  Facilitation provided for head/neck extension; thoracic and lumbar flex/ext; rotation to Rt  Postural control: Left lateral lean                                  ADL Overall ADL's : Needs assistance/impaired Eating/Feeding: Minimal assistance;Sitting;Bed level   Grooming: Wash/dry hands;Wash/dry face;Oral care;Minimal assistance;Sitting;Bed level   Upper Body Bathing: Maximal assistance;Bed level   Lower Body Bathing: Total assistance;Bed level   Upper Body Dressing : Total assistance;Bed level;Sitting   Lower Body Dressing: Total assistance;Bed level   Toilet Transfer: Total assistance Toilet Transfer Details (indicate cue type and reason): unable to safely attempt  Toileting- Clothing Manipulation and Hygiene: Total assistance;Bed level       Functional mobility during ADLs: Maximal assistance;+2 for physical assistance (bed mobility )       Vision Additional Comments: Vision to be further assessed  Perception     Praxis      Pertinent Vitals/Pain Pain Assessment: Faces Faces Pain Scale: Hurts even more Pain Location: left ribs Pain Descriptors / Indicators: Aching Pain Intervention(s): Monitored during session;Limited activity within patient's tolerance     Hand Dominance Right   Extremity/Trunk Assessment Upper Extremity Assessment Upper Extremity Assessment:  Defer to OT evaluation LUE Deficits / Details: Pt with minimal shoulder elevation, scapular depression and adduction; forearm sup/pron 3/5; finger flex/ext 2+/5 LUE Sensation: decreased proprioception LUE Coordination: decreased fine motor;decreased gross motor   Lower Extremity Assessment Lower Extremity Assessment: Defer to PT evaluation LLE Deficits / Details: unable to activate left LE for motor function in supine; supported weight some with left LE sitting LLE Sensation: decreased proprioception LLE Coordination: decreased fine motor;decreased gross motor   Cervical / Trunk Assessment Cervical / Trunk Assessment: Other exceptions Cervical / Trunk Exceptions: Pt maintains head/neck and thoracic trunk in flexion;  passive lateral flexion Rt trunk    Communication Communication Communication: No difficulties   Cognition Arousal/Alertness: Awake/alert Behavior During Therapy: WFL for tasks assessed/performed Overall Cognitive Status: Impaired/Different from baseline Area of Impairment: Attention   Current Attention Level: Selective           General Comments: Pt able to follow one and two step commands consistently.   Cognition appears [facility] for basic tasks    General Comments       Exercises       Shoulder Instructions      Home Living Family/patient expects to be discharged to:: Unsure Living Arrangements: Children Available Help at Discharge: Family Type of Home: Apartment Home Access: Stairs to enter Secretary/administrator of Steps: 12   Home Layout: Able to live on main level with bedroom/bathroom               Home Equipment: None   Additional Comments: tub shower, standard height toilets      Prior Functioning/Environment Level of Independence: Independent             OT Diagnosis: Generalized weakness;Hemiplegia non-dominant side   OT Problem List: Decreased strength;Decreased range of motion;Decreased activity tolerance;Impaired balance  (sitting and/or standing);Impaired vision/perception;Decreased coordination;Decreased safety awareness;Decreased knowledge of use of DME or AE;Decreased knowledge of precautions;Cardiopulmonary status limiting activity;Impaired tone;Impaired sensation;Obesity;Pain;Impaired UE functional use   OT Treatment/Interventions: Self-care/ADL training;Neuromuscular education;Therapeutic activities;Patient/family education;Balance training;Visual/perceptual remediation/compensation;DME and/or AE instruction    OT Goals(Current goals can be found in the care plan section) Acute Rehab OT Goals Patient Stated Goal: to get better  OT Goal Formulation: With patient Time For Goal Achievement: 07/29/14 Potential to Achieve Goals: Good ADL Goals Pt Will Perform Grooming: with min guard assist (EOB) Pt/caregiver will Perform Home Exercise Program: Increased ROM;Left upper extremity;With written HEP provided;With Supervision Additional ADL Goal #1: Pt will maintain EOB sitting with min guard assist while performing simple ADL task Additional ADL Goal #2: Pt will use Lt UE as a gross assist consistently during ADLs  Additional ADL Goal #3: Pt will move to EOB with mod A +1 in prep for ADLs  Additional ADL Goal #4: Pt will participate in further visual assessment   OT Frequency: Min 3X/week   Barriers to D/C: Decreased caregiver support          Co-evaluation PT/OT/SLP Co-Evaluation/Treatment: Yes Reason for Co-Treatment: Complexity of the patient's impairments (multi-system involvement) PT goals addressed during session: Balance;Mobility/safety with mobility OT goals addressed during session: Strengthening/ROM      End of Session Equipment Utilized During Treatment:  Oxygen Nurse Communication: Mobility status  Activity Tolerance: Patient tolerated treatment well Patient left: in bed;with call bell/phone within reach   Time: [id number] OT Time Calculation (min): 35 min Charges:  OT General  Charges $OT Visit: 1 Procedure OT Evaluation $Initial OT Evaluation Tier I: 1 Procedure G-Codes:    Jeani Hawkingonarpe, Laury Huizar M 07/15/2014, 5:06 PM

## 2014-07-15 NOTE — Progress Notes (Signed)
     SUBJECTIVE: no complaints today.   BP 168/97 mmHg  Pulse 111  Temp(Src) 97.6 F (36.4 C) (Oral)  Resp 24  Ht 5\' 6"  (1.676 m)  Wt 218 lb 7.6 oz (99.1 kg)  BMI 35.28 kg/m2  SpO2 96%  Intake/Output Summary (Last 24 hours) at 07/15/14 1346 Last data filed at 07/15/14 1115  Gross per 24 hour  Intake 2792.19 ml  Output  -1653 ml  Net 4445.19 ml    PHYSICAL EXAM General: Well developed, well nourished, in no acute distress. Alert and oriented x 3.  Psych:  Good affect, responds appropriately Neck: No JVD. No masses noted.  Lungs: Clear bilaterally with no wheezes or rhonci noted.  Heart: RRR with no murmurs noted. Abdomen: Bowel sounds are present. Soft, non-tender.  Extremities: No lower extremity edema.   LABS: Basic Metabolic Panel:  Recent Labs  21/30/8603/03/19 0455 07/15/14 0440  NA 130* 125*  K 3.4* 3.9  CL 97 93*  CO2 21 18*  GLUCOSE 102* 126*  BUN 68* 81*  CREATININE 2.63* 3.60*  CALCIUM 7.9* 7.9*  MG 2.0 1.8  PHOS 2.6 3.7   CBC:  Recent Labs  07/14/14 0455 07/15/14 0440  WBC 12.9* 13.4*  NEUTROABS 9.7* 10.7*  HGB 7.3* 7.1*  HCT 23.2* 22.3*  MCV 86.2 87.8  PLT 99* 79*    Current Meds: . sodium chloride   Intravenous Once  . darbepoetin (ARANESP) injection - DIALYSIS  150 mcg Intravenous Q Sat-HD  . feeding supplement (RESOURCE BREEZE)  1 Container Oral TID BM  . fluconazole (DIFLUCAN) IV  200 mg Intravenous Q24H  . hydrocortisone sodium succinate  50 mg Intravenous Daily  . insulin aspart  0-9 Units Subcutaneous 6 times per day  . ipratropium  0.5 mg Nebulization Q6H  . levalbuterol  0.63 mg Nebulization Q6H  . metoprolol  2.5 mg Intravenous 4 times per day  . metroNIDAZOLE  500 mg Oral 3 times per day  . pantoprazole (PROTONIX) IV  40 mg Intravenous Q12H  . piperacillin-tazobactam (ZOSYN)  IV  2.25 g Intravenous 3 times per day     ASSESSMENT AND PLAN: 59 year old female with history of SVT, end-stage renal disease on hemodialysis,  hypertension, resolved acute hypoxemic respiratory failure secondary to the flu who was noted to have weakness on her left side during hospital stay with CT scan demonstrating acute infarct of right anterior cerebral artery territory. Small PFO noted by transthoracic echo, no DVT/SVT by dopplers 06/18/14 .  TEE 06/23/14: no LAA thrombus, severe RAE; mild RVE; severely reduced RV function; severe TR; bowing of atrial septum right to left; positive saline microcavitation study She was seen by Dr. Anne FuSkains on 06/24/14 and felt to have SVT or atrial tachycardia but no atrial fibrillation. She was seen by Dr. Graciela HusbandsKlein on 06/25/14 and he recommended 30 day event monitor at time of discharge. Since then she has had a prolonged hospitalization complicated by perforated pyloric ulcer. She has had episodes of recurrence of SVT.   1. PSVT: She has been seen during this admission by our EP team. 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 Recent episodes have limited dialysis. She is in sinus now. TSH normal this admission. Will continue beta blocker for now. I will ask EP to see in the am to discuss change in therapy with more frequent occurrences over the last few days.   2. ESRD: Per nephrology   MCALHANY,CHRISTOPHER  4/13/20161:46 PM

## 2014-07-15 NOTE — Procedures (Signed)
On hemodialysis again today due to aborted procedure yesterday.  BP remained stable, but got tachycardic to 160 2 hours into treatment, resolving after cessation of treatment.  No fluid removed but up 500cc. Hemoglobin low at 7.1 and will resume HD and give blood again to see if this helps. Got blood on 4/8 for Hgb 7.3g.  Exam unchanged.  No edema or distress. [name],[name]

## 2014-07-15 NOTE — Progress Notes (Signed)
Hr 160's, non-sustained two hours into dialysis tx, tx d/c immediately, hr decreased to low 100's, Dr. Lowell GuitarPowell notified and after pt assessed by him tx resumed, extended tx to transfused two units of PRBCs. Upon BFR decreased and uf off for majority of hd tx, pulse improved. Pt tolerated transfusion well, no s/s of distress.  UF goal +1678 ml, removed 92 ml, total intake 1770 ml. Reported off to PCN.

## 2014-07-15 NOTE — Progress Notes (Signed)
[place] TEAM 1 - Stepdown/ICU TEAM Progress Note  [name] XBJ:[id number] DOB: 05-25-1955 DOA: 06/17/2014 PCP: [name], MD  Admit HPI / Brief Narrative: 59 yo female smoker with hx HTN, ESRD on HD (just began this admit), COPD, failed L AV fistula, initially admitted 3/16 with flu and acute hypoxic resp failure. Ultimately tx to [facility] for worsening metabolic parameters and need for HD. 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 Made little improvement and on 3/30 had worsening hypoxia, generalized pain, hyperkalemia, worsening leukocytosis and PCCM consulted.   Significant Events: 3/16 influenza A+ 3/18 R ACA stroke 3/19 MRI multifocal acute infarction of both hemispheres, large right distal ACA 3/22 TEE severe RAE, severely reduced RV function, severe TR, no LAA thrombus, small PFO on TEE 3/31 to OR ex lap for per pyloric ulcer and sm bowel resection 4/1 septic shock on pressors 4/3 pressors weaned off  4/4 restart pressors, CRRT 4/6 levo switched to neo 4/7 pelvic abscess on CT 4/8 IR placed pelvic drain 4/9 off pressors 4/11 long run of SVT  HPI/Subjective: The patient is resting quietly/noncommunicative at the time of my visit.  Assessment/Plan:  C. difficile colitis Oral Flagyl added today - if does not improve soon may need to consider escalation of treatment  Acute hypoxic respiratory failure secondary to H1 N1 influenza +/- HCAP Resolved  PSVT Has been evaluated by EP - TSH is normal -  cardiology following   Small PFO  ACA CVA with Lt sided weakness  ESRD  Ongoing hemodialysis per nephrology  Status post exploratory laparotomy with closure of perforated pyloric ulcer / small bowel resection  General Surgery continues to follow and is advancing diet  Pelvic abscess status post percutaneous drain placement 4/8  Chronic diastolic congestive heart failure EF 60-65%, grade 1 diastolic dysfunction  Anemia of critical  illness and chronic disease  Thrombocytopenia Serotonin assay negative  COPD Stable at present  Diabetes mellitus 2  Obesity - Body mass index is 35.28 kg/(m^2).  Code Status: FULL Family Communication: no family present at time of exam Disposition Plan: SDU  Consultants: Cardiology Nephrology PCCM  Antibiotics: Zosyn > Flagyl 4/13 >  DVT prophylaxis: SCDs  Objective: Blood pressure 168/97, pulse 111, temperature 97.6 F (36.4 C), temperature source Oral, resp. rate 24, height  (1.676 m), weight 99.1 kg (218 lb 7.6 oz), SpO2 96 %.  Intake/Output Summary (Last 24 hours) at 07/15/14 1203 Last data filed at 07/15/14 1115  Gross per 24 hour  Intake 3305.19 ml  Output  -1653 ml  Net 4958.19 ml   Exam: General: No acute respiratory distress - resting comfortably Lungs: Clear to auscultation bilaterally without wheezes or crackles Cardiovascular: Regular rate and rhythm without murmur gallop or rub Abdomen: Abdominal wound is dressed and dry, abdomen nondistended, no appreciable mass Extremities: No significant cyanosis, clubbing, or edema bilateral lower extremities  Data Reviewed: Basic Metabolic Panel:  Recent Labs Lab 07/11/14 0557  07/12/14 0430 07/12/14 1615 07/13/14 0440 07/13/14 1600 07/14/14 0455 07/15/14 0440  NA 129*  < > 129* 128* 131* 130* 130* 125*  K 3.7  < > 3.6 4.1 4.0 3.9 3.4* 3.9  CL 97  < > 96 96 100 98 97 93*  CO2 29  < > 18*  GLUCOSE 211*  < > 195* 140* 133* 191* 102* 126*  BUN 39*  < > 42* 39* 44* 42* 68* 81*  CREATININE 1.74*  < >  1.70* 1.58* 1.81* 1.75* 2.63* 3.60*  CALCIUM 8.0*  < > 8.0* 7.8* 8.0* 7.9* 7.9* 7.9*  MG 2.2  --  2.2  --  2.1  --  2.0 1.8  PHOS 3.0  < > 2.0* 2.8 1.7* 3.2 2.6 3.7  < > = values in this interval not displayed.  Liver Function Tests:  Recent Labs Lab 07/09/14 0430  07/12/14 1615 07/13/14 0440 07/13/14 1600 07/14/14 0455 07/15/14 0440  AST 17  --   --  60*  --   --   --     ALT 29  --   --  55*  --   --   --   ALKPHOS 111  --   --  155*  --   --   --   BILITOT 2.0*  --   --  1.9*  --   --   --   PROT 4.9*  --   --  5.8*  --   --   --   ALBUMIN 1.2*  < > 1.7* 1.7* 1.8* 1.6* 1.6*  < > = values in this interval not displayed.  Coags:  Recent Labs Lab 07/10/14 0715  INR 1.21    Recent Labs Lab 07/10/14 0715  APTT 37    CBC:  Recent Labs Lab 07/11/14 0557 07/12/14 0430 07/13/14 0440 07/14/14 0455 07/15/14 0440  WBC 23.0* 20.6* 46.7* 12.9* 13.4*  NEUTROABS 20.7* 18.8* 39.2* 9.7* 10.7*  HGB 7.1* 7.0* 7.1* 7.3* 7.1*  HCT 21.7* 21.7* 22.8* 23.2* 22.3*  MCV 83.1 84.8 85.1 86.2 87.8  PLT 130* 108* 97* 99* 79*    CBG:  Recent Labs Lab 07/14/14 1612 07/14/14 2024 07/14/14 2324 07/15/14 0351 07/15/14 0807  GLUCAP 188* 128* 135* 162* 71    Recent Results (from the past 240 hour(s))  Culture, routine-abscess     Status: None   Collection Time: 07/10/14  5:15 PM  Result Value Ref Range Status   Specimen Description ABSCESS RIGHT HIP  Final   Special Requests Normal  Final   Gram Stain   Final    FEW WBC PRESENT,BOTH PMN AND MONONUCLEAR NO SQUAMOUS EPITHELIAL CELLS SEEN NO ORGANISMS SEEN Performed at [facility]    Culture   Final    NO GROWTH 3 DAYS Performed at [facility]    Report Status 07/14/2014 FINAL  Final  Anaerobic culture     Status: None   Collection Time: 07/10/14  5:15 PM  Result Value Ref Range Status   Specimen Description ABSCESS RIGHT HIP  Final   Special Requests Normal  Final   Gram Stain   Final    FEW WBC PRESENT, PREDOMINANTLY PMN NO SQUAMOUS EPITHELIAL CELLS SEEN NO ORGANISMS SEEN Performed at [facility]    Culture   Final    NO ANAEROBES ISOLATED Performed at [facility]    Report Status 07/15/2014 FINAL  Final  Culture, blood (routine x 2)     Status: None (Preliminary result)   Collection Time: 07/13/14  3:45 PM  Result Value Ref Range Status   Specimen  Description BLOOD RIGHT HAND  Final   Special Requests BOTTLES DRAWN AEROBIC ONLY 3CC  Final   Culture   Final           BLOOD CULTURE RECEIVED NO GROWTH TO DATE CULTURE WILL BE HELD FOR 5 DAYS BEFORE ISSUING A FINAL NEGATIVE REPORT Performed at [facility]    Report Status PENDING  Incomplete  Culture, blood (routine x 2)     Status: None (Preliminary result)   Collection Time: 07/13/14  4:00 PM  Result Value Ref Range Status   Specimen Description BLOOD RIGHT HAND  Final   Special Requests BOTTLES DRAWN AEROBIC ONLY 2CC  Final   Culture   Final           BLOOD CULTURE RECEIVED NO GROWTH TO DATE CULTURE WILL BE HELD FOR 5 DAYS BEFORE ISSUING A FINAL NEGATIVE REPORT Note: Culture results may be compromised due to an inadequate volume of blood received in culture bottles. Performed at [facility]    Report Status PENDING  Incomplete  Clostridium Difficile by PCR     Status: Abnormal   Collection Time: 07/14/14 10:15 PM  Result Value Ref Range Status   C difficile by pcr POSITIVE (A) NEGATIVE Final    Comment: CRITICAL RESULT CALLED TO, READ BACK BY AND VERIFIED WITH: Candy Sledge RN 9:45 07/15/14 ([email or web address])      Studies:   Recent x-ray studies have been reviewed in detail by the Attending Physician  Scheduled Meds:  Scheduled Meds: . sodium chloride   Intravenous Once  . darbepoetin (ARANESP) injection - DIALYSIS  150 mcg Intravenous Q Sat-HD  . feeding supplement (RESOURCE BREEZE)  1 Container Oral TID BM  . fluconazole (DIFLUCAN) IV  200 mg Intravenous Q24H  . hydrocortisone sodium succinate  50 mg Intravenous Daily  . insulin aspart  0-9 Units Subcutaneous 6 times per day  . ipratropium  0.5 mg Nebulization Q6H  . levalbuterol  0.63 mg Nebulization Q6H  . metoprolol  2.5 mg Intravenous 4 times per day  . metroNIDAZOLE  500 mg Oral 3 times per day  . pantoprazole (PROTONIX) IV  40 mg Intravenous Q12H  . piperacillin-tazobactam (ZOSYN)  IV  2.25 g Intravenous  3 times per day    Time spent on care of this patient: 35 mins   MCCLUNG,JEFFREY T , MD   [facility] Office  [phone] Pager - Text Page per Loretha Stapler as per below:  On-Call/Text Page:      [email or web address]      password [email or web address]  If 7PM-7AM, please contact night-coverage [email or web address] Password [email or web address] 07/15/2014, 12:03 PM   LOS: 28 days

## 2014-07-15 NOTE — Progress Notes (Signed)
14 Days Post-Op  Subjective: Diarrhea   Objective: Vital signs in last 24 hours: Temp:  [97.1 F (36.2 C)-98.1 F (36.7 C)] 98 F (36.7 C) (04/13 1053) Pulse Rate:  [96-112] 101 (04/13 1053) Resp:  [15-36] 18 (04/13 1053) BP: (87-157)/(45-92) 134/67 mmHg (04/13 1053) SpO2:  [94 %-100 %] 96 % (04/13 1053) Weight:  [93.1 kg (205 lb 4 oz)-97 kg (213 lb 13.5 oz)] 97 kg (213 lb 13.5 oz) (04/13 0745) Last BM Date: 07/14/14  Intake/Output from previous day: 04/12 0701 - 04/13 0700 In: 3258.2 [P.O.:600; I.V.:360; IV Piggyback:650; TPN:1638.2] Out: 25 [Drains:25] Intake/Output this shift: Total I/O In: 335 [Blood:335] Out: -   Incision/Wound:intact soft TTP throughout no peritonitis  JP serous wound clean  Lab Results:   Recent Labs  07/14/14 0455 07/15/14 0440  WBC 12.9* 13.4*  HGB 7.3* 7.1*  HCT 23.2* 22.3*  PLT 99* 79*   BMET  Recent Labs  07/14/14 0455 07/15/14 0440  NA 130* 125*  K 3.4* 3.9  CL 97 93*  CO2 21 18*  GLUCOSE 102* 126*  BUN 68* 81*  CREATININE 2.63* 3.60*  CALCIUM 7.9* 7.9*   PT/INR No results for input(s): LABPROT, INR in the last 72 hours. ABG No results for input(s): PHART, HCO3 in the last 72 hours.  Invalid input(s): PCO2, PO2  Studies/Results: Dg Abd 1 View  07/13/2014   CLINICAL DATA:  Ileus  EXAM: ABDOMEN - 1 VIEW  COMPARISON:  CT abdomen pelvis dated 07/09/2014  FINDINGS: Nonobstructive bowel gas pattern.  Contrast within distal small bowel in the right lower quadrant, ascending colon, and splenic flexure.  Pigtail drain in the right lower quadrant.  Enteric tube in the proximal gastric body.  IMPRESSION: Nonobstructive bowel gas pattern.  Residual contrast in the distal small bowel and colon, as above, unchanged from prior CT and suggesting adynamic colonic ileus.  Pigtail drain in the right lower quadrant.   Electronically Signed   By: Charline BillsSriyesh  Krishnan M.D.   On: 07/13/2014 12:48   Dg Chest Port 1 View  07/15/2014   CLINICAL DATA:   Encounter for central line placement  EXAM: PORTABLE CHEST - 1 VIEW  COMPARISON:  07/14/2014  FINDINGS: Advanced left IJ catheter, tip now at the SVC level. No interval displacement of right IJ dual-lumen catheter.  Stable cardiomegaly and aortic tortuosity.  There is no edema, consolidation, effusion, or pneumothorax.  IMPRESSION: Advanced or replaced left IJ catheter, tip now at the SVC level.   Electronically Signed   By: Marnee SpringJonathon  Watts M.D.   On: 07/15/2014 01:51   Dg Chest Port 1 View  07/14/2014   CLINICAL DATA:  59 year old female with left-sided pleural effusion. Shortness breath. Hypertension. Chronic kidney disease. Subsequent encounter.  EXAM: PORTABLE CHEST - 1 VIEW  COMPARISON:  07/12/2014.  FINDINGS: Right split catheter in place with tips in the region of the proximal and proximal to mid superior vena cava.  Left internal jugular catheter tip at the level of the distal left brachiocephalic vein just proximal to formation of the superior vena cava.  No gross pneumothorax.  Cardiomegaly.  Central pulmonary vascular prominence.  No segmental consolidation.  Minimally tortuous aorta.  IMPRESSION: Central pulmonary vascular prominence appears stable.  Nasogastric tube removed.  Cardiomegaly.   Electronically Signed   By: Lacy DuverneySteven  Olson M.D.   On: 07/14/2014 07:29    Anti-infectives: Anti-infectives    Start     Dose/Rate Route Frequency Ordered Stop   07/13/14 2200  piperacillin-tazobactam (ZOSYN)  IVPB 2.25 g     2.25 g 100 mL/hr over 30 Minutes Intravenous 3 times per day 07/13/14 1318     07/13/14 2000  fluconazole (DIFLUCAN) IVPB 200 mg     200 mg 100 mL/hr over 60 Minutes Intravenous Every 24 hours 07/13/14 1318     07/07/14 1200  vancomycin (VANCOCIN) IVPB 1000 mg/200 mL premix  Status:  Discontinued     1,000 mg 200 mL/hr over 60 Minutes Intravenous Every 24 hours 07/07/14 0937 07/14/14 1712   07/06/14 2000  fluconazole (DIFLUCAN) IVPB 400 mg  Status:  Discontinued     400 mg 100  mL/hr over 120 Minutes Intravenous Every 24 hours 07/06/14 1507 07/13/14 1318   07/06/14 1800  piperacillin-tazobactam (ZOSYN) IVPB 2.25 g  Status:  Discontinued     2.25 g 100 mL/hr over 30 Minutes Intravenous 4 times per day 07/06/14 1505 07/13/14 1318   07/05/14 2000  fluconazole (DIFLUCAN) IVPB 200 mg  Status:  Discontinued     200 mg 100 mL/hr over 60 Minutes Intravenous Every 24 hours 07/05/14 0757 07/06/14 1507   07/05/14 1400  piperacillin-tazobactam (ZOSYN) IVPB 2.25 g  Status:  Discontinued     2.25 g 100 mL/hr over 30 Minutes Intravenous 3 times per day 07/05/14 0749 07/06/14 1505   07/02/14 1800  vancomycin (VANCOCIN) IVPB 1000 mg/200 mL premix  Status:  Discontinued     1,000 mg 200 mL/hr over 60 Minutes Intravenous Every 24 hours 07/01/14 1858 07/05/14 0801   07/01/14 2200  piperacillin-tazobactam (ZOSYN) IVPB 3.375 g  Status:  Discontinued     3.375 g 100 mL/hr over 30 Minutes Intravenous 4 times per day 07/01/14 1858 07/05/14 0749   07/01/14 2000  fluconazole (DIFLUCAN) IVPB 400 mg  Status:  Discontinued     400 mg 100 mL/hr over 120 Minutes Intravenous Every 24 hours 07/01/14 1858 07/05/14 0757   07/01/14 1400  fluconazole (DIFLUCAN) IVPB 200 mg  Status:  Discontinued     200 mg 100 mL/hr over 60 Minutes Intravenous Every 24 hours 07/01/14 1330 07/01/14 1853   07/01/14 1000  piperacillin-tazobactam (ZOSYN) IVPB 2.25 g  Status:  Discontinued     2.25 g 100 mL/hr over 30 Minutes Intravenous Every 8 hours 07/01/14 0952 07/01/14 1853   07/01/14 1000  vancomycin (VANCOCIN) 500 mg in sodium chloride 0.9 % 100 mL IVPB     500 mg 100 mL/hr over 60 Minutes Intravenous  Once 07/01/14 0952 07/01/14 1126   06/30/14 1200  vancomycin (VANCOCIN) IVPB 1000 mg/200 mL premix     1,000 mg 200 mL/hr over 60 Minutes Intravenous  Once 06/30/14 0944 06/30/14 1403   06/30/14 1200  ceFEPIme (MAXIPIME) 2 g in dextrose 5 % 50 mL IVPB     2 g 100 mL/hr over 30 Minutes Intravenous  Once 06/30/14  0944 06/30/14 1425   06/30/14 0100  vancomycin (VANCOCIN) 2,000 mg in sodium chloride 0.9 % 500 mL IVPB     2,000 mg 250 mL/hr over 120 Minutes Intravenous  Once 06/30/14 0048 06/30/14 0525   06/30/14 0100  ceFEPIme (MAXIPIME) 2 g in dextrose 5 % 50 mL IVPB     2 g 100 mL/hr over 30 Minutes Intravenous  Once 06/30/14 0048 06/30/14 0322   06/27/14 0600  cefUROXime (ZINACEF) 1.5 g in dextrose 5 % 50 mL IVPB     1.5 g 100 mL/hr over 30 Minutes Intravenous On call to O.R. 06/26/14 1019 06/27/14 0630   06/19/14 1800  oseltamivir (TAMIFLU) capsule 30 mg     30 mg Oral Every M-W-F (1800) 06/19/14 1003 06/22/14 1841   06/18/14 1600  oseltamivir (TAMIFLU) capsule 30 mg  Status:  Discontinued     30 mg Oral Daily 06/18/14 1538 06/19/14 1003      Assessment/Plan: s/p Procedure(s): EXPLORATORY LAPAROTOMY WITH CLOSURE OF PERFORATED PYLORIC ULCER (N/A) SMALL BOWEL RESECTION (N/A) CT Thursday to follow up abscess Adv diet Treat c diff   LOS: 28 days    [name] A. 07/15/2014

## 2014-07-16 ENCOUNTER — Encounter: Payer: Self-pay | Admitting: Vascular Surgery

## 2014-07-16 ENCOUNTER — Inpatient Hospital Stay (HOSPITAL_COMMUNITY): Payer: Medicaid Other

## 2014-07-16 LAB — CBC
HCT: 27.1 % — ABNORMAL LOW (ref 36.0–46.0)
Hemoglobin: 8.8 g/dL — ABNORMAL LOW (ref 12.0–15.0)
MCH: 28 pg (ref 26.0–34.0)
MCHC: 32.5 g/dL (ref 30.0–36.0)
MCV: 86.3 fL (ref 78.0–100.0)
Platelets: 67 10*3/uL — ABNORMAL LOW (ref 150–400)
RBC: 3.14 MIL/uL — ABNORMAL LOW (ref 3.87–5.11)
RDW: 21.6 % — ABNORMAL HIGH (ref 11.5–15.5)
WBC: 12.2 10*3/uL — ABNORMAL HIGH (ref 4.0–10.5)

## 2014-07-16 LAB — GLUCOSE, CAPILLARY
Glucose-Capillary: 132 mg/dL — ABNORMAL HIGH (ref 70–99)
Glucose-Capillary: 130 mg/dL — ABNORMAL HIGH (ref 70–99)
Glucose-Capillary: 109 mg/dL — ABNORMAL HIGH (ref 70–99)
Glucose-Capillary: 128 mg/dL — ABNORMAL HIGH (ref 70–99)

## 2014-07-16 LAB — TYPE AND SCREEN
ABO/RH(D): B POS
Antibody Screen: NEGATIVE
UNIT DIVISION: 0
Unit division: 0

## 2014-07-16 LAB — COMPREHENSIVE METABOLIC PANEL
ALT: 43 U/L — ABNORMAL HIGH (ref 0–35)
AST: 21 U/L (ref 0–37)
Albumin: 1.6 g/dL — ABNORMAL LOW (ref 3.5–5.2)
Alkaline Phosphatase: 108 U/L (ref 39–117)
Anion gap: 11 (ref 5–15)
BUN: 68 mg/dL — ABNORMAL HIGH (ref 6–23)
CO2: 21 mmol/L (ref 19–32)
CREATININE: 3.53 mg/dL — AB (ref 0.50–1.10)
Calcium: 7.7 mg/dL — ABNORMAL LOW (ref 8.4–10.5)
Chloride: 96 mmol/L (ref 96–112)
GFR calc Af Amer: 15 mL/min — ABNORMAL LOW (ref >90)
GFR, EST NON AFRICAN AMERICAN: 13 mL/min — AB (ref >90)
GLUCOSE: 139 mg/dL — AB (ref 70–99)
Potassium: 3.5 mmol/L (ref 3.5–5.1)
SODIUM: 128 mmol/L — AB (ref 135–145)
Total Bilirubin: 1.8 mg/dL — ABNORMAL HIGH (ref 0.3–1.2)
Total Protein: 5.3 g/dL — ABNORMAL LOW (ref 6.0–8.3)

## 2014-07-16 LAB — PHOSPHORUS: Phosphorus: 3.9 mg/dL (ref 2.3–4.6)

## 2014-07-16 LAB — MAGNESIUM: Magnesium: 1.7 mg/dL (ref 1.5–2.5)

## 2014-07-16 MED ORDER — INSULIN ASPART 100 UNIT/ML ~~LOC~~ SOLN
0.0000 [IU] | Freq: Three times a day (TID) | SUBCUTANEOUS | Status: DC
  Administered 2014-07-16 – 2014-07-19 (×3): 1 [IU] via SUBCUTANEOUS

## 2014-07-16 MED ORDER — LEVALBUTEROL HCL 0.63 MG/3ML IN NEBU
0.6300 mg | INHALATION_SOLUTION | Freq: Three times a day (TID) | RESPIRATORY_TRACT | Status: DC
Start: 2014-07-16 — End: 2014-07-25
  Administered 2014-07-17 – 2014-07-25 (×24): 0.63 mg via RESPIRATORY_TRACT
  Filled 2014-07-16 (×52): qty 3

## 2014-07-16 MED ORDER — IOHEXOL 300 MG/ML  SOLN
25.0000 mL | INTRAMUSCULAR | Status: DC
  Administered 2014-07-16: 25 mL via ORAL

## 2014-07-16 MED ORDER — IPRATROPIUM BROMIDE 0.02 % IN SOLN
0.5000 mg | Freq: Three times a day (TID) | RESPIRATORY_TRACT | Status: DC
  Administered 2014-07-17 – 2014-07-25 (×24): 0.5 mg via RESPIRATORY_TRACT
  Filled 2014-07-16 (×26): qty 2.5

## 2014-07-16 MED ORDER — METOPROLOL TARTRATE 12.5 MG HALF TABLET
12.5000 mg | ORAL_TABLET | Freq: Two times a day (BID) | ORAL | Status: DC
  Administered 2014-07-17 – 2014-07-20 (×8): 12.5 mg via ORAL
  Filled 2014-07-16 (×10): qty 1

## 2014-07-16 MED ORDER — POTASSIUM CHLORIDE CRYS ER 20 MEQ PO TBCR
20.0000 meq | EXTENDED_RELEASE_TABLET | Freq: Two times a day (BID) | ORAL | Status: AC
  Administered 2014-07-16 (×2): 20 meq via ORAL
  Filled 2014-07-16 (×2): qty 1

## 2014-07-16 MED ORDER — PANTOPRAZOLE SODIUM 40 MG PO TBEC
40.0000 mg | DELAYED_RELEASE_TABLET | Freq: Every day | ORAL | Status: DC
  Administered 2014-07-16: 40 mg via ORAL
  Filled 2014-07-16: qty 1

## 2014-07-16 MED ORDER — MAGNESIUM OXIDE 400 (241.3 MG) MG PO TABS
400.0000 mg | ORAL_TABLET | Freq: Two times a day (BID) | ORAL | Status: AC
  Administered 2014-07-16 – 2014-07-17 (×4): 400 mg via ORAL
  Filled 2014-07-16 (×4): qty 1

## 2014-07-16 MED ORDER — FENTANYL CITRATE (PF) 100 MCG/2ML IJ SOLN
25.0000 ug | INTRAMUSCULAR | Status: DC | PRN
  Administered 2014-07-20 – 2014-08-04 (×13): 50 ug via INTRAVENOUS
  Administered 2014-08-04: 25 ug via INTRAVENOUS
  Administered 2014-08-05 – 2014-08-09 (×26): 50 ug via INTRAVENOUS
  Filled 2014-07-16 (×9): qty 2

## 2014-07-16 NOTE — Progress Notes (Signed)
SUBJECTIVE: The patient is doing well today.  At this time, she denies chest pain, shortness of breath, or any new concerns.   CURRENT MEDICATIONS: . sodium chloride   Intravenous Once  . darbepoetin (ARANESP) injection - DIALYSIS  150 mcg Intravenous Q Sat-HD  . feeding supplement (RESOURCE BREEZE)  1 Container Oral TID BM  . fluconazole (DIFLUCAN) IV  200 mg Intravenous Q24H  . hydrocortisone sodium succinate  50 mg Intravenous Daily  . insulin aspart  0-9 Units Subcutaneous 6 times per day  . iohexol  25 mL Oral Q1 Hr x 2  . ipratropium  0.5 mg Nebulization Q6H  . levalbuterol  0.63 mg Nebulization Q6H  . metoprolol  2.5 mg Intravenous 4 times per day  . metroNIDAZOLE  500 mg Oral 3 times per day  . pantoprazole (PROTONIX) IV  40 mg Intravenous Q12H  . piperacillin-tazobactam (ZOSYN)  IV  2.25 g Intravenous 3 times per day   . sodium chloride Stopped (07/14/14 1800)  . TPN (CLINIMIX) Adult without lytes 83 mL/hr at 07/15/14 1723   And  . fat emulsion 240 mL (07/15/14 1723)    OBJECTIVE: Physical Exam: Filed Vitals:   07/16/14 0431 07/16/14 0600 07/16/14 0741 07/16/14 0747  BP:  101/69  106/67  Pulse:      Temp:    97.6 F (36.4 C)  TempSrc:    Oral  Resp:  15  16  Height:      Weight: 216 lb 4.3 oz (98.1 kg)     SpO2:   96%     Intake/Output Summary (Last 24 hours) at 07/16/14 0831 Last data filed at 07/16/14 0604  Gross per 24 hour  Intake 3250.3 ml  Output  -1668 ml  Net 4918.3 ml    Telemetry reveals sinus rhythm with intermittent short runs of long RP tachycardia  GEN- The patient is well appearing, alert and oriented x 3 today.   Head- normocephalic, atraumatic Eyes-  Sclera clear, conjunctiva pink Ears- hearing intact Oropharynx- clear Neck- supple, no JVP Lungs- Clear to ausculation bilaterally, normal work of breathing Heart- Regular rate and rhythm, no murmurs, rubs or gallops  GI- soft, ND, + BS Extremities- no clubbing, cyanosis, or  edema Skin- no rash or lesion Psych- euthymic mood, full affect Neuro- strength and sensation are intact  LABS: Basic Metabolic Panel:  Recent Labs  16/10/96 0440 07/16/14 0440  NA 125* 128*  K 3.9 3.5  CL 93* 96  CO2 18* 21  GLUCOSE 126* 139*  BUN 81* 68*  CREATININE 3.60* 3.53*  CALCIUM 7.9* 7.7*  MG 1.8 1.7  PHOS 3.7 3.9   Liver Function Tests:  Recent Labs  07/15/14 0440 07/16/14 0440  AST  --  21  ALT  --  43*  ALKPHOS  --  108  BILITOT  --  1.8*  PROT  --  5.3*  ALBUMIN 1.6* 1.6*   CBC:  Recent Labs  07/14/14 0455 07/15/14 0440 07/16/14 0440  WBC 12.9* 13.4* 12.2*  NEUTROABS 9.7* 10.7*  --   HGB 7.3* 7.1* 8.8*  HCT 23.2* 22.3* 27.1*  MCV 86.2 87.8 86.3  PLT 99* 79* 67*   RADIOLOGY: Ct Abdomen Pelvis Wo Contrast 07/06/2014   CLINICAL DATA:  Followup surgery for recent small bowel perforation. Elevated white blood cell count.  EXAM: CT ABDOMEN AND PELVIS WITHOUT CONTRAST  TECHNIQUE: Multidetector CT imaging of the abdomen and pelvis was performed following the standard protocol without IV contrast.  COMPARISON:  07/01/2014  FINDINGS: Lower chest: Small left pleural effusion with overlying atelectasis. There is an NG tube in the stomach.  Hepatobiliary: No focal hepatic lesions or intrahepatic biliary dilatation. The gallbladder demonstrates some high attenuation material which could be vicarious excretion of contrast or sludge.  Pancreas: Grossly normal and stable. Small fatty cleft is noted in the pancreatic head.  Spleen: Normal size.  No focal lesions.  Adrenals/Urinary Tract: The adrenal glands and kidneys are stable.  Stomach/Bowel: The stomach, duodenum, small bowel and colon are unremarkable. No leaking oral contrast. Small amount of free air is likely due to the upper abdominal drainage catheter. Residual free fluid is noted in the abdomen and pelvis likely related to recent surgery. It measures as simple fluid.  Vascular/Lymphatic: No mesenteric or  retroperitoneal mass or adenopathy. Stable infrarenal abdominal aortic aneurysm.  Other: Moderate free pelvic fluid. The uterus and ovaries are unremarkable. No pelvic mass or adenopathy. No inguinal adenopathy.  Musculoskeletal: No significant bony findings.  IMPRESSION: Expected postoperative changes. Residual free fluid in the abdomen/ pelvis but no leaking oral contrast or significant free air. No findings for small bowel obstruction.   Electronically Signed   By: Rudie Meyer M.D.   On: 07/06/2014 18:24   Dg Chest 2 View 06/17/2014   CLINICAL DATA:  Shortness of breath and chest tenderness for 1 week. Oxygen dependent.  EXAM: CHEST  2 VIEW  COMPARISON:  Chest radiograph 01/28/2014.  FINDINGS: Increasing cardiomegaly. No focal infiltrates or congestive failure. No effusion or pneumothorax. Bones unremarkable.  IMPRESSION: Increasing cardiomegaly.  No active infiltrates or failure.   Electronically Signed   By: Davonna Belling M.D.   On: 06/17/2014 17:18   ASSESSMENT AND PLAN:  Principal Problem:   Acute respiratory failure with hypoxia Active Problems:   Cerebral thrombosis with cerebral infarction   Hypertension   ESRD needing dialysis   Obesity (BMI 30-39.9)   Depression   Left renal mass   Chronic diastolic heart failure   H1N1 influenza   Tobacco use disorder   Renal failure (ARF), acute on chronic   Hyperlipidemia   PSVT (paroxysmal supraventricular tachycardia)   Hypoxia   SOB (shortness of breath)   Perforated gastric ulcer   Pelvic fluid collection  Assessment/Plan: 1.  SVT The patient has recurrent long RP tachycardia that has limited dialysis. She has been getting Metoprolol 2.5mg  four times daily with some benefit. She is relatively hypotensive and likely will not tolerate increased dose of beta blockers. AAD therapy is limited to Amiodarone with ESRD on HD but may make PJRT worse.  Will discuss with Dr Ladona Ridgel.  Keep K >3.9, Mg >1.9 SVT will likely improve with resolution of  acute medical issues. She is not currently a candidate for EP procedures, but eventually ablation may be the most appropriate option.   2.  ESRD Per nephrology  3.  Perforated pyloric ulcer S/p small bowel resection Per surgery  4.  C Diff Per primary team  Gypsy Balsam, NP 07/16/2014 8:43 AM  Electrophysiology attending  Patient seen and examined. 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 She is unfortunately not a particular good candidate for catheter ablation because of her multiple comorbidities. For now, I would like the patient continue on her beta blocker, and we would reserve catheter ablation only if the patient's symptoms become incessant. If she improves from her other medical problems, would consider catheter ablation of her SVT the road  as an outpatient. The patient does note she has had multiple episodes of SVT for many many years.  Lewayne BuntingGregg Taylor, M.D.

## 2014-07-16 NOTE — Progress Notes (Signed)
Assessment: 1. Perforated pyloric ulcer / necrotic small bowel - s/p repair of perf ulcer w partial ileum resection 3/30 2. Recurrent sepsis - improving s/p perc drainage of pelvic fluid collection; on flagyl/ zosyn/ diflucan 3. S/P acute ACA stroke / left hemiparesis 4. Nutrition / ileus - remains on TNA, hypokalemic 5. ESRD new start to HD 6. Anemia s/p PRBCs 7. R HF - severe by TEE 8. HD access - Permcath placed 3/26 d/t AVF problems; when stable VVS wants to considering revision of AVF 9. pSVT  Plan - Appreciate cardiology eval. HD Fri.  Subjective: Interval History: Eval by Cards. Got PRBCs.  Objective: Vital signs in last 24 hours: Temp:  [97.1 F (36.2 C)-98.4 F (36.9 C)] 97.6 F (36.4 C) (04/14 0747) Pulse Rate:  [98-111] 102 (04/14 0400) Resp:  [15-36] 16 (04/14 0747) BP: (98-174)/(53-109) 106/67 mmHg (04/14 0747) SpO2:  [93 %-100 %] 96 % (04/14 0741) Weight:  [98.1 kg (216 lb 4.3 oz)-99.1 kg (218 lb 7.6 oz)] 98.1 kg (216 lb 4.3 oz) (04/14 0431) Weight change: 3.9 kg (8 lb 9.6 oz)  Intake/Output from previous day: 04/13 0701 - 04/14 0700 In: 3358.3 [I.V.:335; Blood:670; IV Piggyback:200; TPN:2148.3] Out: -1668 [Drains:10] Intake/Output this shift:    General appearance: alert and cooperative GI: post op bandaged Extremities: LUE AVF  Lab Results:  Recent Labs  07/15/14 0440 07/16/14 0440  WBC 13.4* 12.2*  HGB 7.1* 8.8*  HCT 22.3* 27.1*  PLT 79* 67*   BMET:  Recent Labs  07/15/14 0440 07/16/14 0440  NA 125* 128*  K 3.9 3.5  CL 93* 96  CO2 18* 21  GLUCOSE 126* 139*  BUN 81* 68*  CREATININE 3.60* 3.53*  CALCIUM 7.9* 7.7*   No results for input(s): PTH in the last 72 hours. Iron Studies: No results for input(s): IRON, TIBC, TRANSFERRIN, FERRITIN in the last 72 hours. Studies/Results: Dg Chest Port 1 View  07/15/2014   CLINICAL DATA:  Encounter for central line placement  EXAM: PORTABLE CHEST - 1 VIEW  COMPARISON:  07/14/2014  FINDINGS:  Advanced left IJ catheter, tip now at the SVC level. No interval displacement of right IJ dual-lumen catheter.  Stable cardiomegaly and aortic tortuosity.  There is no edema, consolidation, effusion, or pneumothorax.  IMPRESSION: Advanced or replaced left IJ catheter, tip now at the SVC level.   Electronically Signed   By: Marnee SpringJonathon  Watts M.D.   On: 07/15/2014 01:51   Scheduled: . sodium chloride   Intravenous Once  . darbepoetin (ARANESP) injection - DIALYSIS  150 mcg Intravenous Q Sat-HD  . feeding supplement (RESOURCE BREEZE)  1 Container Oral TID BM  . fluconazole (DIFLUCAN) IV  200 mg Intravenous Q24H  . hydrocortisone sodium succinate  50 mg Intravenous Daily  . insulin aspart  0-9 Units Subcutaneous 6 times per day  . iohexol  25 mL Oral Q1 Hr x 2  . ipratropium  0.5 mg Nebulization Q6H  . levalbuterol  0.63 mg Nebulization Q6H  . metoprolol  2.5 mg Intravenous 4 times per day  . metroNIDAZOLE  500 mg Oral 3 times per day  . pantoprazole (PROTONIX) IV  40 mg Intravenous Q12H  . piperacillin-tazobactam (ZOSYN)  IV  2.25 g Intravenous 3 times per day     LOS: 29 days   Palmieri,Candie Gintz C 07/16/2014,9:33 AM

## 2014-07-16 NOTE — Progress Notes (Signed)
[facility] Progress Note  15 Days Post-Op  Subjective: Pt c/o about drinking her contrast.  No N/V, not much abdominal pain.  Wants candy.  Having flatus and BMs.  Drains putting out very little.  Objective: Vital signs in last 24 hours: Temp:  [97.1 F (36.2 C)-98.4 F (36.9 C)] 97.8 F (36.6 C) (04/14 0400) Pulse Rate:  [98-112] 102 (04/14 0400) Resp:  [15-36] 15 (04/14 0600) BP: (98-174)/(45-109) 101/69 mmHg (04/14 0600) SpO2:  [93 %-100 %] 98 % (04/14 0400) Weight:  [97 kg (213 lb 13.5 oz)-99.1 kg (218 lb 7.6 oz)] 98.1 kg (216 lb 4.3 oz) (04/14 0431) Last BM Date: 07/15/14  Intake/Output from previous day: 04/13 0701 - 04/14 0700 In: 3358.3 [I.V.:335; Blood:670; IV Piggyback:200; TPN:2148.3] Out: -1668 [Drains:10] Intake/Output this shift:    PE: Gen:  Alert, NAD, pleasant Card:  RRR, no M/G/R heard Pulm:  CTA, no W/R/R Abd: Soft, mildly tender to midline wound, +BS, no HSM, midline wound clean with good granulation, 2 JP drains with minimal serous drainage   Lab Results:   Recent Labs  07/15/14 0440 07/16/14 0440  WBC 13.4* 12.2*  HGB 7.1* 8.8*  HCT 22.3* 27.1*  PLT 79* 67*   BMET  Recent Labs  07/15/14 0440 07/16/14 0440  NA 125* 128*  K 3.9 3.5  CL 93* 96  CO2 18* 21  GLUCOSE 126* 139*  BUN 81* 68*  CREATININE 3.60* 3.53*  CALCIUM 7.9* 7.7*   PT/INR No results for input(s): LABPROT, INR in the last 72 hours. CMP     Component Value Date/Time   NA 128* 07/16/2014 0440   NA 139 10/02/2012 1046   K 3.5 07/16/2014 0440   K 4.4 10/02/2012 1046   CL 96 07/16/2014 0440   CO2 21 07/16/2014 0440   CO2 25 10/02/2012 1046   GLUCOSE 139* 07/16/2014 0440   GLUCOSE 110 10/02/2012 1046   BUN 68* 07/16/2014 0440   BUN 35.2* 10/02/2012 1046   CREATININE 3.53* 07/16/2014 0440   CREATININE 2.5* 10/02/2012 1046   CALCIUM 7.7* 07/16/2014 0440   CALCIUM 9.8 10/02/2012 1046   PROT 5.3* 07/16/2014 0440   PROT 8.4* 10/02/2012 1046   ALBUMIN 1.6* 07/16/2014 0440   ALBUMIN 3.1* 10/02/2012 1046   AST 21 07/16/2014 0440   AST 9 10/02/2012 1046   ALT 43* 07/16/2014 0440   ALT <6 Repeated and Verified 10/02/2012 1046   ALKPHOS 108 07/16/2014 0440   ALKPHOS 108 10/02/2012 1046   BILITOT 1.8* 07/16/2014 0440   BILITOT 0.21 10/02/2012 1046   GFRNONAA 13* 07/16/2014 0440   GFRAA 15* 07/16/2014 0440   Lipase  No results found for: LIPASE     Studies/Results: Dg Chest Port 1 View  07/15/2014   CLINICAL DATA:  Encounter for central line placement  EXAM: PORTABLE CHEST - 1 VIEW  COMPARISON:  07/14/2014  FINDINGS: Advanced left IJ catheter, tip now at the SVC level. No interval displacement of right IJ dual-lumen catheter.  Stable cardiomegaly and aortic tortuosity.  There is no edema, consolidation, effusion, or pneumothorax.  IMPRESSION: Advanced or replaced left IJ catheter, tip now at the SVC level.   Electronically Signed   By: Marnee Spring M.D.   On: 07/15/2014 01:51    Anti-infectives: Anti-infectives    Start     Dose/Rate Route Frequency Ordered Stop   07/15/14 1400  metroNIDAZOLE (FLAGYL) tablet 500 mg     500 mg Oral 3 times per day 07/15/14 1202 07/29/14  1359   07/13/14 2200  piperacillin-tazobactam (ZOSYN) IVPB 2.25 g     2.25 g 100 mL/hr over 30 Minutes Intravenous 3 times per day 07/13/14 1318     07/13/14 2000  fluconazole (DIFLUCAN) IVPB 200 mg     200 mg 100 mL/hr over 60 Minutes Intravenous Every 24 hours 07/13/14 1318     07/07/14 1200  vancomycin (VANCOCIN) IVPB 1000 mg/200 mL premix  Status:  Discontinued     1,000 mg 200 mL/hr over 60 Minutes Intravenous Every 24 hours 07/07/14 0937 07/14/14 1712   07/06/14 2000  fluconazole (DIFLUCAN) IVPB 400 mg  Status:  Discontinued     400 mg 100 mL/hr over 120 Minutes Intravenous Every 24 hours 07/06/14 1507 07/13/14 1318   07/06/14 1800  piperacillin-tazobactam (ZOSYN) IVPB 2.25 g  Status:  Discontinued     2.25 g 100 mL/hr over 30 Minutes Intravenous  4 times per day 07/06/14 1505 07/13/14 1318   07/05/14 2000  fluconazole (DIFLUCAN) IVPB 200 mg  Status:  Discontinued     200 mg 100 mL/hr over 60 Minutes Intravenous Every 24 hours 07/05/14 0757 07/06/14 1507   07/05/14 1400  piperacillin-tazobactam (ZOSYN) IVPB 2.25 g  Status:  Discontinued     2.25 g 100 mL/hr over 30 Minutes Intravenous 3 times per day 07/05/14 0749 07/06/14 1505   07/02/14 1800  vancomycin (VANCOCIN) IVPB 1000 mg/200 mL premix  Status:  Discontinued     1,000 mg 200 mL/hr over 60 Minutes Intravenous Every 24 hours 07/01/14 1858 07/05/14 0801   07/01/14 2200  piperacillin-tazobactam (ZOSYN) IVPB 3.375 g  Status:  Discontinued     3.375 g 100 mL/hr over 30 Minutes Intravenous 4 times per day 07/01/14 1858 07/05/14 0749   07/01/14 2000  fluconazole (DIFLUCAN) IVPB 400 mg  Status:  Discontinued     400 mg 100 mL/hr over 120 Minutes Intravenous Every 24 hours 07/01/14 1858 07/05/14 0757   07/01/14 1400  fluconazole (DIFLUCAN) IVPB 200 mg  Status:  Discontinued     200 mg 100 mL/hr over 60 Minutes Intravenous Every 24 hours 07/01/14 1330 07/01/14 1853   07/01/14 1000  piperacillin-tazobactam (ZOSYN) IVPB 2.25 g  Status:  Discontinued     2.25 g 100 mL/hr over 30 Minutes Intravenous Every 8 hours 07/01/14 0952 07/01/14 1853   07/01/14 1000  vancomycin (VANCOCIN) 500 mg in sodium chloride 0.9 % 100 mL IVPB     500 mg 100 mL/hr over 60 Minutes Intravenous  Once 07/01/14 0952 07/01/14 1126   06/30/14 1200  vancomycin (VANCOCIN) IVPB 1000 mg/200 mL premix     1,000 mg 200 mL/hr over 60 Minutes Intravenous  Once 06/30/14 0944 06/30/14 1403   06/30/14 1200  ceFEPIme (MAXIPIME) 2 g in dextrose 5 % 50 mL IVPB     2 g 100 mL/hr over 30 Minutes Intravenous  Once 06/30/14 0944 06/30/14 1425   06/30/14 0100  vancomycin (VANCOCIN) 2,000 mg in sodium chloride 0.9 % 500 mL IVPB     2,000 mg 250 mL/hr over 120 Minutes Intravenous  Once 06/30/14 0048 06/30/14 0525   06/30/14 0100   ceFEPIme (MAXIPIME) 2 g in dextrose 5 % 50 mL IVPB     2 g 100 mL/hr over 30 Minutes Intravenous  Once 06/30/14 0048 06/30/14 0322   06/27/14 0600  cefUROXime (ZINACEF) 1.5 g in dextrose 5 % 50 mL IVPB     1.5 g 100 mL/hr over 30 Minutes Intravenous On call to O.R. 06/26/14  1019 06/27/14 0630   06/19/14 1800  oseltamivir (TAMIFLU) capsule 30 mg     30 mg Oral Every M-W-F (1800) 06/19/14 1003 06/22/14 1841   06/18/14 1600  oseltamivir (TAMIFLU) capsule 30 mg  Status:  Discontinued     30 mg Oral Daily 06/18/14 1538 06/19/14 1003       Assessment/Plan POD #15 s/p EXPLORATORY LAPAROTOMY WITH CLOSURE OF PERFORATED PYLORIC ULCER, SMALL BOWEL RESECTION - 07/01/2014 - Derrell LollingIngram Post-operative ileus Vancomycin 3/29---> Cefepime 3/29--->3/30 Zosyn 3/30---> Fluconazole 3/30---> -On soft diet -Open wound - clean - dressing changes WD BID to midline wound, more often as needed for saturation -KUB non-obstructive bowel gas pattern, colinic ileus -Needs to mobilize to aid in recovery -Use IS, only gets to 500 Intra-abdominal abscess -S/p perc drain placed - 10mL serous output -Repeat CT today to check abscess collections Leukocytosis -WBC down to 12.2 - continues to improve Anemia - 7.3 - improved ESRD - Creat 2.53 PCM/TPN -On TPN, wean to off now that tolerating soft diet Stroke - 06/19/2014 -Left hemiplegia - this will probably limit her mobility and return to normal activities Activity - PT following On Solucortef - Started 4/7 Disp - PT/OT recommending LTAC vs 24hr supervision    LOS: 29 days    DORT, Flynn Lininger 07/16/2014, 7:36 AM Pager: [phone]

## 2014-07-16 NOTE — Progress Notes (Addendum)
[facility] TEAM 1 - Stepdown/ICU TEAM Progress Note  Aleen C Dull WUJ:[id number] DOB: 01-15-1956 DOA: 06/17/2014 PCP: Willey BladeEAN, [name], MD  Admit HPI / Brief Narrative: 59 yo female smoker with hx HTN, ESRD on HD (just began this admit), COPD, failed L AV fistula, initially admitted 3/16 with flu and acute hypoxic resp failure. Ultimately tx to [facility] due to need for HD. 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 Made little improvement and on 3/30 had worsening hypoxia, generalized pain, hyperkalemia, worsening leukocytosis and PCCM consulted.   Significant Events: 3/16 influenza A+ 3/18 R ACA stroke 3/19 MRI multifocal acute infarction of both hemispheres, large right distal ACA 3/22 TEE severe RAE, severely reduced RV function, severe TR, no LAA thrombus, small PFO on TEE 3/31 to OR ex lap for per pyloric ulcer and sm bowel resection 4/1 septic shock on pressors 4/3 pressors weaned off  4/4 restart pressors, CRRT 4/6 levo switched to neo 4/7 pelvic abscess on CT 4/8 IR placed pelvic drain 4/9 off pressors 4/11 long run of SVT  HPI/Subjective: Pt alert and conversant.  She says she feels better overall. She denies cp, n/v, sob, or current abdom pain.    Assessment/Plan:  C. difficile colitis Oral Flagyl - cont to follow - no evidence of complications at present  Acute hypoxic respiratory failure secondary to H1 N1 influenza +/- HCAP Resolved  PSVT Has been evaluated by EP - TSH is normal - Cardiology following - follow K+ and Mg   Small PFO  ACA CVA with Lt sided weakness Will need ongoing long term rehab   ESRD new start HD this admit  Ongoing hemodialysis per Nephrology - permcath placed 3/26 d/t AVF problems; when stable VVS wants to considering revision of AVF  Status post exploratory laparotomy with closure of perforated pyloric ulcer / small bowel resection w/ postop ileus General Surgery continues to follow and is advancing diet - now  off TNA   Pelvic abscess status post percutaneous drain placement 4/8  Chronic diastolic congestive heart failure w/ Severe RHF EF 60-65%, grade 1 diastolic dysfunction  Anemia of critical illness and chronic disease Hgb presently stable s/p 2U PRBC 4/13 - follow   Thrombocytopenia Serotonin assay negative - plt count is dropping - follow trend - no evidence of blood loss at present   COPD Stable at present  Diabetes mellitus 2 CBG reasonably controlled - cont to follow   Obesity - Body mass index is 34.92 kg/(m^2).  Code Status: FULL Family Communication: no family present at time of exam Disposition Plan: SDU  Consultants: Cardiology Nephrology PCCM Gen Surgery   Antibiotics: Zosyn > Oral Flagyl 4/13 >  DVT prophylaxis: SCDs  Objective: Blood pressure 133/79, pulse 102, temperature 97.8 F (36.6 C), temperature source Oral, resp. rate 16, height 5\' 6"  (1.676 m), weight 98.1 kg (216 lb 4.3 oz), SpO2 97 %.  Intake/Output Summary (Last 24 hours) at 07/16/14 1434 Last data filed at 07/16/14 1000  Gross per 24 hour  Intake 1983.3 ml  Output     10 ml  Net 1973.3 ml   Exam: General: No acute respiratory distress - alert and conversant  Lungs: Clear to auscultation bilaterally without wheezes or crackles Cardiovascular: Regular rate and rhythm without murmur gallop or rub Abdomen: Abdominal wound is dressed and dry, abdomen nondistended, no appreciable mass Extremities: No significant cyanosis, clubbing, edema bilateral lower extremities  Data Reviewed: Basic Metabolic Panel:  Recent Labs Lab 07/12/14  0430  07/13/14 0440 07/13/14 1600 07/14/14 0455 07/15/14 0440 07/16/14 0440  NA 129*  < > 131* 130* 130* 125* 128*  K 3.6  < > 4.0 3.9 3.4* 3.9 3.5  CL 96  < > 100 98 97 93* 96  CO2 25  < > 18* 21  GLUCOSE 195*  < > 133* 191* 102* 126* 139*  BUN 42*  < > 44* 42* 68* 81* 68*  CREATININE 1.70*  < > 1.81* 1.75* 2.63* 3.60* 3.53*  CALCIUM 8.0*  <  > 8.0* 7.9* 7.9* 7.9* 7.7*  MG 2.2  --  2.1  --  2.0 1.8 1.7  PHOS 2.0*  < > 1.7* 3.2 2.6 3.7 3.9  < > = values in this interval not displayed.  Liver Function Tests:  Recent Labs Lab 07/13/14 0440 07/13/14 1600 07/14/14 0455 07/15/14 0440 07/16/14 0440  AST 60*  --   --   --  21  ALT 55*  --   --   --  43*  ALKPHOS 155*  --   --   --  108  BILITOT 1.9*  --   --   --  1.8*  PROT 5.8*  --   --   --  5.3*  ALBUMIN 1.7* 1.8* 1.6* 1.6* 1.6*    Coags:  Recent Labs Lab 07/10/14 0715  INR 1.21    Recent Labs Lab 07/10/14 0715  APTT 37    CBC:  Recent Labs Lab 07/11/14 0557 07/12/14 0430 07/13/14 0440 07/14/14 0455 07/15/14 0440 07/16/14 0440  WBC 23.0* 20.6* 46.7* 12.9* 13.4* 12.2*  NEUTROABS 20.7* 18.8* 39.2* 9.7* 10.7*  --   HGB 7.1* 7.0* 7.1* 7.3* 7.1* 8.8*  HCT 21.7* 21.7* 22.8* 23.2* 22.3* 27.1*  MCV 83.1 84.8 85.1 86.2 87.8 86.3  PLT 130* 108* 97* 99* 79* 67*    CBG:  Recent Labs Lab 07/15/14 2014 07/15/14 2313 07/16/14 0429 07/16/14 0745 07/16/14 1129  GLUCAP 154* 138* 132* 130* 109*    Recent Results (from the past 240 hour(s))  Culture, routine-abscess     Status: None   Collection Time: 07/10/14  5:15 PM  Result Value Ref Range Status   Specimen Description ABSCESS RIGHT HIP  Final   Special Requests Normal  Final   Gram Stain   Final    FEW WBC PRESENT,BOTH PMN AND MONONUCLEAR NO SQUAMOUS EPITHELIAL CELLS SEEN NO ORGANISMS SEEN Performed at [facility]    Culture   Final    NO GROWTH 3 DAYS Performed at [facility]    Report Status 07/14/2014 FINAL  Final  Anaerobic culture     Status: None   Collection Time: 07/10/14  5:15 PM  Result Value Ref Range Status   Specimen Description ABSCESS RIGHT HIP  Final   Special Requests Normal  Final   Gram Stain   Final    FEW WBC PRESENT, PREDOMINANTLY PMN NO SQUAMOUS EPITHELIAL CELLS SEEN NO ORGANISMS SEEN Performed at [facility]    Culture   Final     NO ANAEROBES ISOLATED Performed at [facility]    Report Status 07/15/2014 FINAL  Final  Culture, blood (routine x 2)     Status: None (Preliminary result)   Collection Time: 07/13/14  3:45 PM  Result Value Ref Range Status   Specimen Description BLOOD RIGHT HAND  Final   Special Requests BOTTLES DRAWN AEROBIC ONLY 3CC  Final   Culture   Final  BLOOD CULTURE RECEIVED NO GROWTH TO DATE CULTURE WILL BE HELD FOR 5 DAYS BEFORE ISSUING A FINAL NEGATIVE REPORT Performed at [facility]    Report Status PENDING  Incomplete  Culture, blood (routine x 2)     Status: None (Preliminary result)   Collection Time: 07/13/14  4:00 PM  Result Value Ref Range Status   Specimen Description BLOOD RIGHT HAND  Final   Special Requests BOTTLES DRAWN AEROBIC ONLY 2CC  Final   Culture   Final           BLOOD CULTURE RECEIVED NO GROWTH TO DATE CULTURE WILL BE HELD FOR 5 DAYS BEFORE ISSUING A FINAL NEGATIVE REPORT Note: Culture results may be compromised due to an inadequate volume of blood received in culture bottles. Performed at [facility]    Report Status PENDING  Incomplete  Clostridium Difficile by PCR     Status: Abnormal   Collection Time: 07/14/14 10:15 PM  Result Value Ref Range Status   C difficile by pcr POSITIVE (A) NEGATIVE Final    Comment: CRITICAL RESULT CALLED TO, READ BACK BY AND VERIFIED WITH: Candy Sledge RN 9:45 07/15/14 ([email or web address])      Studies:   Recent x-ray studies have been reviewed in detail by the Attending Physician  Scheduled Meds:  Scheduled Meds: . sodium chloride   Intravenous Once  . darbepoetin (ARANESP) injection - DIALYSIS  150 mcg Intravenous Q Sat-HD  . feeding supplement (RESOURCE BREEZE)  1 Container Oral TID BM  . fluconazole (DIFLUCAN) IV  200 mg Intravenous Q24H  . hydrocortisone sodium succinate  50 mg Intravenous Daily  . insulin aspart  0-9 Units Subcutaneous TID WC  . ipratropium  0.5 mg Nebulization Q6H  .  levalbuterol  0.63 mg Nebulization Q6H  . magnesium oxide  400 mg Oral BID  . metoprolol  2.5 mg Intravenous 4 times per day  . metroNIDAZOLE  500 mg Oral 3 times per day  . pantoprazole  40 mg Oral Daily  . piperacillin-tazobactam (ZOSYN)  IV  2.25 g Intravenous 3 times per day  . potassium chloride  20 mEq Oral BID    Time spent on care of this patient: 35 mins   MCCLUNG,JEFFREY T , MD   [facility] Office  [phone] Pager - Text Page per Loretha Stapler as per below:  On-Call/Text Page:      [email or web address]      password [email or web address]  If 7PM-7AM, please contact night-coverage [email or web address] Password [email or web address] 07/16/2014, 2:34 PM   LOS: 29 days

## 2014-07-16 NOTE — Progress Notes (Signed)
PARENTERAL NUTRITION CONSULT NOTE - FOLLOW UP  Pharmacy Consult:  TPN Indication:  Prolonged ileus  No Known Allergies   Patient Measurements: Height: 5\' 6"  (167.6 cm) Weight: 216 lb 4.3 oz (98.1 kg) IBW/kg (Calculated) : 59.3  Adjusted body weight: 67 kg  Vital Signs: Temp: 97.8 F (36.6 C) (04/14 0400) Temp Source: Oral (04/14 0400) BP: 101/69 mmHg (04/14 0600) Pulse Rate: 102 (04/14 0400) Intake/Output from previous day: 04/13 0701 - 04/14 0700 In: 3358.3 [I.V.:335; Blood:670; IV Piggyback:200; TPN:2148.3] Out: -1668 [Drains:10]  Labs:  Recent Labs  07/14/14 0455 07/15/14 0440 07/16/14 0440  WBC 12.9* 13.4* 12.2*  HGB 7.3* 7.1* 8.8*  HCT 23.2* 22.3* 27.1*  PLT 99* 79* 67*     Recent Labs  07/14/14 0455 07/15/14 0440 07/16/14 0440  NA 130* 125* 128*  K 3.4* 3.9 3.5  CL 97 93* 96  CO2 21 18* 21  GLUCOSE 102* 126* 139*  BUN 68* 81* 68*  CREATININE 2.63* 3.60* 3.53*  CALCIUM 7.9* 7.9* 7.7*  MG 2.0 1.8 1.7  PHOS 2.6 3.7 3.9  PROT  --   --  5.3*  ALBUMIN 1.6* 1.6* 1.6*  AST  --   --  21  ALT  --   --  43*  ALKPHOS  --   --  108  BILITOT  --   --  1.8*   Estimated Creatinine Clearance: 20.5 mL/min (by C-G formula based on Cr of 3.53).    Recent Labs  07/15/14 2014 07/15/14 2313 07/16/14 0429  GLUCAP 154* 138* 132*   Insulin Requirements in the past 24 hours:  7 units sensitive SSI + 10 units regular insulin in TPN  Current Nutrition:  Carb modified Clinimix 5/15 (no electrolytes) to 83 ml/hr and continue IVFE 20% at 10 ml/hr.  TPN will provide 1894 kCal and 100gm of protein per day, meeting 86% of minimal kCal and 83% of minimal protein needs.   Nutritional Goals: 2200-2600 kCal, 120-140 grams of protein per day  Assessment: [age] YOF with complicated hospital course. S/p closure perforated pyloric ulcer, SBR for ischemic necrosis, diffuse peritonitis on 07/01/14. Pharmacy consulted to provide TPN for nutrition support.   GI: Ileus, on bowel  rest.  Albumin 1.6. Prealbumin from 4/1 is 5, increased to 10 on 4/4.  NG tube removed and patient had BM, +flatus. Very little fluid from drain. KUB suggested dynamic colonic ileus. Intake of food is 75-100%, tolerating soft diet well.  Endo: no hx DM - CBGs ok with SSI + insulin in TPN (110-210s).  Hydrocortisone  Lytes: none in TPN while on iHD - Na 128, Cl 96 (possibly due to dilutional effect), K 3.5 (goal >4 for ileus), Phos 3.9, Mg 1.7, CoCa 9.8  Renal: gout / ESRD - CRRT 4/4 >> 4/11, iHD 4/12 >> - 4/12 HD session did not go well, 4/13 session was better but still had tachycardia (BFR 15950ml/min). Pt also received PRBC for low Hgb. BUN down to 68. Renal requested pharmacy to maximally concentrate TPN (Unable to maximize anymore than Clinimix 5/15). MIVF: NS @ 8110ml/hr. I/Os net positive 5 L over last 24 hours. Will plan for next HD session on 4/15.  Pulm: asthma - extubated 4/2, decreased to RA - Atrovent/Xopenex nebs  Cards: HTN - HR tachy, BP ok.  Hepatotobil: LFTs and t bili trending down, TG WNL  Neuro: hx depression / insomnia - new stroke noted on 3/18.   ID: Day #16 of zosyn/diflucan for peritonitis. S/p OR for perforated  pyloric ulcer, evacuation of ascites and small bowel resection for ischemic necrosis.  Added oral Flagyl on 4/13 for Cdiff. Afebrile, WBC slightly elevated at 12.2. 4/8 placed perc drain of pelvic abscess  Vanc 3/29 >> 4/12 Cefepime 3/29 >> 3/30 Zosyn 3/30 >> Fluc 3/20 >>  3/30 peritoneal fluid - rare lactobacillus (no sensitivies performed; usually sensitive to PCN) 3/28 blood x2 - ngtd 3/36 C diff - neg  Best Practices: SCDs, PPI IV  TPN Access: CVC  TPN day#: 15  Plan:  - Decrease Clinimix 5/15 (no electrolytes) to 69ml/hr x 2 hrs and then stop - Change SSI to TID w/ meals - Give of K PO x 2  - Give Mg Ox  PO x 4 - D/C TPN labs and orders  Enzo Bi, PharmD Clinical Pharmacist Resident Pager [phone] 07/16/2014 7:23  AM

## 2014-07-16 NOTE — Progress Notes (Signed)
Referring Physician(s): CCS  Subjective: Pelvic abscess drain placed 4/8 Intact and output good Pt seems some better, had repeat CT today  Allergies: Review of patient's allergies indicates no known allergies.  Medications: Prior to Admission medications   Medication Sig Start Date End Date Taking? Authorizing Provider  allopurinol (ZYLOPRIM) 100 MG tablet Take 100 mg by mouth 2 (two) times daily as needed (for gout).   Yes Historical Provider, MD  citalopram (CELEXA) 20 MG tablet Take 20 mg by mouth daily.   Yes Historical Provider, MD  furosemide (LASIX) 80 MG tablet Take 80 mg by mouth daily.   Yes Historical Provider, MD  hydrOXYzine (ATARAX/VISTARIL) 25 MG tablet Take 25 mg by mouth 3 (three) times daily as needed for anxiety.   Yes Historical Provider, MD  oxyCODONE (ROXICODONE) 5 MG immediate release tablet Take 1 tablet (5 mg total) by mouth every 6 (six) hours as needed for severe pain. Patient not taking: Reported on 06/17/2014 01/28/14   Raymond GurneyKimberly A Trinh, PA-C     Vital Signs: BP 133/79 mmHg  Pulse 102  Temp(Src) 97.8 F (36.6 C) (Oral)  Resp 16  Ht 5\' 6"  (1.676 m)  Wt 216 lb 4.3 oz (98.1 kg)  BMI 34.92 kg/m2  SpO2 97%  Physical Exam  Skin:  Site of Rt buttock drain placement is NT; no bleeding Output serous color, hasn't been much past 2 days   Nursing note and vitals reviewed.   Imaging: Ct Abdomen Pelvis Wo Contrast  07/16/2014   CLINICAL DATA:  Followup abdominal abscess  EXAM: CT ABDOMEN AND PELVIS WITHOUT CONTRAST  TECHNIQUE: Multidetector CT imaging of the abdomen and pelvis was performed following the standard protocol without IV contrast. A small amount of oral contrast was administered.  COMPARISON:  07/10/2014/07/09/2014  FINDINGS: The lung bases demonstrate minimal left basilar atelectasis. The heart remains enlarged in size.  The liver, gallbladder, spleen, adrenal glands and pancreas are all normal in their CT appearance. A surgical drain is  noted in the left upper quadrant stable from the prior exam. A minimal amount of left upper quadrant fluid remains adjacent to the spleen. This is roughly stable from the prior exam. Kidneys are stable in appearance. Mild dilatation of the abdominal aorta is noted and stable.  There is been interval placement of a trans gluteal drainage catheter. The amount of pelvic fluid has decreased significantly although a small rim out remains. It now measures approximately 6.4 x 2.4 cm in greatest dimension. This is decreased from 9.0 x 6.2 cm. Calcified uterine fibroids are again seen. No new focal abnormality is noted. The bladder is well distended. No bony abnormality is seen.  IMPRESSION: Interval reduction in the size of pelvic fluid collection. Correlation with current drainage levels is recommended.  The remainder of the exam is stable from the prior studies.   Electronically Signed   By: Alcide CleverMark  Lukens M.D.   On: 07/16/2014 11:23   Dg Abd 1 View  07/13/2014   CLINICAL DATA:  Ileus  EXAM: ABDOMEN - 1 VIEW  COMPARISON:  CT abdomen pelvis dated 07/09/2014  FINDINGS: Nonobstructive bowel gas pattern.  Contrast within distal small bowel in the right lower quadrant, ascending colon, and splenic flexure.  Pigtail drain in the right lower quadrant.  Enteric tube in the proximal gastric body.  IMPRESSION: Nonobstructive bowel gas pattern.  Residual contrast in the distal small bowel and colon, as above, unchanged from prior CT and suggesting adynamic colonic ileus.  Pigtail drain  in the right lower quadrant.   Electronically Signed   By: Charline Bills M.D.   On: 07/13/2014 12:48   Dg Chest Port 1 View  07/15/2014   CLINICAL DATA:  Encounter for central line placement  EXAM: PORTABLE CHEST - 1 VIEW  COMPARISON:  07/14/2014  FINDINGS: Advanced left IJ catheter, tip now at the SVC level. No interval displacement of right IJ dual-lumen catheter.  Stable cardiomegaly and aortic tortuosity.  There is no edema, consolidation,  effusion, or pneumothorax.  IMPRESSION: Advanced or replaced left IJ catheter, tip now at the SVC level.   Electronically Signed   By: Marnee Spring M.D.   On: 07/15/2014 01:51   Dg Chest Port 1 View  07/14/2014   CLINICAL DATA:  59 year old female with left-sided pleural effusion. Shortness breath. Hypertension. Chronic kidney disease. Subsequent encounter.  EXAM: PORTABLE CHEST - 1 VIEW  COMPARISON:  07/12/2014.  FINDINGS: Right split catheter in place with tips in the region of the proximal and proximal to mid superior vena cava.  Left internal jugular catheter tip at the level of the distal left brachiocephalic vein just proximal to formation of the superior vena cava.  No gross pneumothorax.  Cardiomegaly.  Central pulmonary vascular prominence.  No segmental consolidation.  Minimally tortuous aorta.  IMPRESSION: Central pulmonary vascular prominence appears stable.  Nasogastric tube removed.  Cardiomegaly.   Electronically Signed   By: Lacy Duverney M.D.   On: 07/14/2014 07:29    Labs:  CBC:  Recent Labs  07/13/14 0440 07/14/14 0455 07/15/14 0440 07/16/14 0440  WBC 46.7* 12.9* 13.4* 12.2*  HGB 7.1* 7.3* 7.1* 8.8*  HCT 22.8* 23.2* 22.3* 27.1*  PLT 97* 99* 79* 67*    COAGS:  Recent Labs  07/01/14 1445 07/01/14 1813 07/02/14 0430 07/03/14 0445 07/04/14 0510 07/07/14 0400 07/10/14 0715  INR 1.44 1.83* 1.51*  --   --   --  1.21  APTT  --   --  35 38* 36 41* 37    BMP:  Recent Labs  07/13/14 1600 07/14/14 0455 07/15/14 0440 07/16/14 0440  NA 130* 130* 125* 128*  K 3.9 3.4* 3.9 3.5  CL 98 97 93* 96  CO2 23 21 18* 21  GLUCOSE 191* 102* 126* 139*  BUN 42* 68* 81* 68*  CALCIUM 7.9* 7.9* 7.9* 7.7*  CREATININE 1.75* 2.63* 3.60* 3.53*  GFRNONAA 31* 19* 13* 13*  GFRAA 36* 22* 15* 15*    LIVER FUNCTION TESTS:  Recent Labs  07/06/14 0552  07/09/14 0430  07/13/14 0440 07/13/14 1600 07/14/14 0455 07/15/14 0440 07/16/14 0440  BILITOT 1.9*  --  2.0*  --  1.9*   --   --   --  1.8*  AST 25  --  17  --  60*  --   --   --  21  ALT 69*  --  29  --  55*  --   --   --  43*  ALKPHOS 75  --  111  --  155*  --   --   --  108  PROT 4.5*  --  4.9*  --  5.8*  --   --   --  5.3*  ALBUMIN 1.4*  < > 1.2*  < > 1.7* 1.8* 1.6* 1.6* 1.6*  < > = values in this interval not displayed.  Assessment and Plan:  Rt TG drain placed 4/9, intact with good function Output starting to trend down. Will follow. Reviewed CT with  Dr. Loreta Ave, abscess collection markedly improved but still with residual fluid at drain.  Continue drain for now, encouraged aggresive drain flushing to ensure proper function.  SignedBrayton El 07/16/2014, 2:49 PM   I spent a total of 15 Minutes in face to face in clinical consultation/evaluation, greater than 50% of which was counseling/coordinating care for TG drain

## 2014-07-17 DIAGNOSIS — IMO0002 Reserved for concepts with insufficient information to code with codable children: Secondary | ICD-10-CM | POA: Insufficient documentation

## 2014-07-17 DIAGNOSIS — J9601 Acute respiratory failure with hypoxia: Principal | ICD-10-CM

## 2014-07-17 LAB — GLUCOSE, CAPILLARY
GLUCOSE-CAPILLARY: 79 mg/dL (ref 70–99)
Glucose-Capillary: 89 mg/dL (ref 70–99)

## 2014-07-17 LAB — CBC
HEMATOCRIT: 29.2 % — AB (ref 36.0–46.0)
Hemoglobin: 9.6 g/dL — ABNORMAL LOW (ref 12.0–15.0)
MCH: 27.9 pg (ref 26.0–34.0)
MCHC: 32.9 g/dL (ref 30.0–36.0)
MCV: 84.9 fL (ref 78.0–100.0)
Platelets: 79 10*3/uL — ABNORMAL LOW (ref 150–400)
RBC: 3.44 MIL/uL — ABNORMAL LOW (ref 3.87–5.11)
RDW: 21.8 % — ABNORMAL HIGH (ref 11.5–15.5)
WBC: 10.6 10*3/uL — ABNORMAL HIGH (ref 4.0–10.5)

## 2014-07-17 LAB — COMPREHENSIVE METABOLIC PANEL
ALBUMIN: 1.6 g/dL — AB (ref 3.5–5.2)
ALT: 37 U/L — ABNORMAL HIGH (ref 0–35)
AST: 21 U/L (ref 0–37)
Alkaline Phosphatase: 119 U/L — ABNORMAL HIGH (ref 39–117)
Anion gap: 16 — ABNORMAL HIGH (ref 5–15)
BUN: 86 mg/dL — ABNORMAL HIGH (ref 6–23)
CALCIUM: 7.8 mg/dL — AB (ref 8.4–10.5)
CO2: 16 mmol/L — ABNORMAL LOW (ref 19–32)
Chloride: 94 mmol/L — ABNORMAL LOW (ref 96–112)
Creatinine, Ser: 4.93 mg/dL — ABNORMAL HIGH (ref 0.50–1.10)
GFR calc Af Amer: 10 mL/min — ABNORMAL LOW (ref >90)
GFR calc non Af Amer: 9 mL/min — ABNORMAL LOW (ref >90)
Glucose, Bld: 70 mg/dL (ref 70–99)
Potassium: 5.3 mmol/L — ABNORMAL HIGH (ref 3.5–5.1)
Sodium: 126 mmol/L — ABNORMAL LOW (ref 135–145)
TOTAL PROTEIN: 5.9 g/dL — AB (ref 6.0–8.3)
Total Bilirubin: 1.7 mg/dL — ABNORMAL HIGH (ref 0.3–1.2)

## 2014-07-17 LAB — VANCOMYCIN, RANDOM: Vancomycin Rm: 27.3 ug/mL

## 2014-07-17 LAB — MAGNESIUM: Magnesium: 1.9 mg/dL (ref 1.5–2.5)

## 2014-07-17 MED ORDER — HEPARIN SODIUM (PORCINE) 1000 UNIT/ML DIALYSIS: 1000.0000 [IU] | INTRAMUSCULAR | Status: DC | PRN

## 2014-07-17 MED ORDER — LIDOCAINE HCL (PF) 1 % IJ SOLN: 5.0000 mL | INTRAMUSCULAR | Status: DC | PRN

## 2014-07-17 MED ORDER — SODIUM CHLORIDE 0.9 % IV SOLN: 100.0000 mL | INTRAVENOUS | Status: DC | PRN

## 2014-07-17 MED ORDER — ALTEPLASE 2 MG IJ SOLR
2.0000 mg | Freq: Once | INTRAMUSCULAR | Status: AC | PRN
  Filled 2014-07-17: qty 2

## 2014-07-17 MED ORDER — VANCOMYCIN HCL IN DEXTROSE 750-5 MG/150ML-% IV SOLN
750.0000 mg | Freq: Once | INTRAVENOUS | Status: AC
  Administered 2014-07-17: 750 mg via INTRAVENOUS
  Filled 2014-07-17: qty 150

## 2014-07-17 MED ORDER — METOPROLOL TARTRATE 1 MG/ML IV SOLN
INTRAVENOUS | Status: AC
  Administered 2014-07-17: 5 mg via INTRAVENOUS
  Filled 2014-07-17: qty 5

## 2014-07-17 MED ORDER — LIDOCAINE-PRILOCAINE 2.5-2.5 % EX CREA: 1.0000 "application " | TOPICAL_CREAM | CUTANEOUS | Status: DC | PRN

## 2014-07-17 MED ORDER — PENTAFLUOROPROP-TETRAFLUOROETH EX AERO: 1.0000 "application " | INHALATION_SPRAY | CUTANEOUS | Status: DC | PRN

## 2014-07-17 MED ORDER — NEPRO/CARBSTEADY PO LIQD: 237.0000 mL | ORAL | Status: DC | PRN

## 2014-07-17 NOTE — Progress Notes (Signed)
Calorie Count Note  48 hour calorie count ordered. Initiated today at breakfast.   Spoke with RN who completed meal ticket for breakfast. She and pt confirm that pt is not eating much. RN just provided pt with [employer] supplement. TNA was just d/c.   Pt reports that she feels like her appetite is slowly improving. She is drinking the [employer] supplements. Encouraged her to consume her supplements and food off tray. Reviewed importance of good nutritional intake to promote healing.   Tray completion reviewed with RN. Pt consumed 1/2 of biscuit and gracy, 100% of orange juice, and 1 strip of bacon.   Diet: Carb Modified Supplements: Resource Breeze po TID, each supplement provides 250 kcal and 9 grams of protein  Breakfast: 336 kcals, 9 grams protein  Estimated Nutritional Needs: Kcal: 2200-2600 Protein: 120-140 grams Fluid: 1.2 L/day  Nutrition Dx: Inadequate oral intake related to altered GI function as evidenced by limited intake, ongoing.   Goal: Pt to meet >/= 90% of their estimated nutrition needs, not met  Intervention: Continue Resource Breeze po TID, each supplement provides 250 kcal and 9 grams of protein  F/U with calorie count results on Monday, 07/20/14.  [name] A. Jimmye Norman, RD, LDN, CDE Pager: [phone] After hours Pager: [phone]

## 2014-07-17 NOTE — Progress Notes (Addendum)
ANTIBIOTIC CONSULT NOTE - FOLLOW UP  Pharmacy Consult for Vancomycin, Zosyn, and fluconazole Indication: pneumonia/peritonitis  No Known Allergies  Patient Measurements: Height: 5\' 6"  (167.6 cm) Weight: 221 lb 12.5 oz (100.6 kg) IBW/kg (Calculated) : 59.3 Adjusted Body Weight: n/a  Vital Signs: Temp: 97.4 F (36.3 C) (04/15 1224) Temp Source: Oral (04/15 1224) BP: 107/79 mmHg (04/15 1224) Pulse Rate: 87 (04/15 1224) Intake/Output from previous day: 04/14 0701 - 04/15 0700 In: 839.5 [P.O.:240; I.V.:113.5; IV Piggyback:250; TPN:236] Out: 13 [Drains:13] Intake/Output from this shift: Total I/O In: 150 [P.O.:120; I.V.:30] Out: -   Labs:  Recent Labs  07/15/14 0440 07/16/14 0440 07/17/14 0410  WBC 13.4* 12.2* 10.6*  HGB 7.1* 8.8* 9.6*  PLT 79* 67* 79*  CREATININE 3.60* 3.53* 4.93*   Estimated Creatinine Clearance: 14.9 mL/min (by C-G formula based on Cr of 4.93).  Recent Labs  07[facility]17/14 0822  [facility]     Microbiology: Recent Results (from the past 720 hour(s))  MRSA PCR Screening     Status: None   Collection Time: 06/17/14  9:29 PM  Result Value Ref Range Status   MRSA by PCR NEGATIVE NEGATIVE Final    Comment:        The GeneXpert MRSA Assay (FDA approved for NASAL specimens only), is one component of a comprehensive MRSA colonization surveillance program. It is not intended to diagnose MRSA infection nor to guide or monitor treatment for MRSA infections.   Clostridium Difficile by PCR     Status: None   Collection Time: 06/18/14  3:52 AM  Result Value Ref Range Status   C difficile by pcr NEGATIVE NEGATIVE Final  Clostridium Difficile by PCR     Status: None   Collection Time: 06/27/14  7:40 PM  Result Value Ref Range Status   C difficile by pcr NEGATIVE NEGATIVE Final  Culture, blood (routine x 2)     Status: None   Collection Time: 06/29/14  8:10 AM  Result Value Ref Range Status   Specimen Description BLOOD RIGHT ANTECUBITAL  Final    Special Requests BOTTLES DRAWN AEROBIC ONLY 3CC  Final   Culture   Final    NO GROWTH 5 DAYS [facility]    Report Status 07/05/2014 FINAL  Final  Culture, blood (routine x 2)     Status: None   Collection Time: 06/29/14  8:15 AM  Result Value Ref Range Status   Specimen Description BLOOD RIGHT HAND  Final   Special Requests BOTTLES DRAWN AEROBIC ONLY 2CC  Final   Culture   Final    NO GROWTH 5 DAYS Note: Culture results may be compromised due to an inadequate volume of blood received in culture bottles. [facility] at [facility]    Report Status 07/05/2014 FINAL  Final  MRSA PCR Screening     Status: None   Collection Time: 07/01/14  2:49 PM  Result Value Ref Range Status   MRSA by PCR NEGATIVE NEGATIVE Final    Comment:        The GeneXpert MRSA Assay (FDA approved for NASAL specimens only), is one component of a comprehensive MRSA colonization surveillance program. It is not intended to diagnose MRSA infection nor to guide or monitor treatment for MRSA infections.   Anaerobic culture     Status: None   Collection Time: 07/01/14  4:18 PM  Result Value Ref Range Status   Specimen Description FLUID PERITONEAL  Final   Special Requests ON SWAB  Final   Gram Stain   Final    RARE WBC PRESENT, PREDOMINANTLY MONONUCLEAR NO ORGANISMS SEEN Performed at [facility]    Culture   Final    NO ANAEROBES ISOLATED Performed at [facility]    Report Status 07/07/2014 FINAL  Final  Body fluid culture     Status: None   Collection Time: 07/01/14  4:18 PM  Result Value Ref Range Status   Specimen Description FLUID PERITONEAL  Final   Special Requests ON SWAB  Final   Gram Stain   Final    RARE WBC PRESENT, PREDOMINANTLY MONONUCLEAR NO ORGANISMS SEEN Performed at [facility]    Culture   Final    RARE LACTOBACILLUS SPECIES Note: Standardized susceptibility testing for this organism is not available. CRITICAL RESULT  CALLED TO, READ BACK BY AND VERIFIED WITH: CHRIS RN ON 2H AT [id number] [id number] BY CASTC Performed at [facility]    Report Status 07/04/2014 FINAL  Final  Culture, routine-abscess     Status: None   Collection Time: 07/10/14  5:15 PM  Result Value Ref Range Status   Specimen Description ABSCESS RIGHT HIP  Final   Special Requests Normal  Final   Gram Stain   Final    FEW WBC PRESENT,BOTH PMN AND MONONUCLEAR NO SQUAMOUS EPITHELIAL CELLS SEEN NO ORGANISMS SEEN Performed at [facility]    Culture   Final    NO GROWTH 3 DAYS Performed at [facility]    Report Status 07/14/2014 FINAL  Final  Anaerobic culture     Status: None   Collection Time: 07/10/14  5:15 PM  Result Value Ref Range Status   Specimen Description ABSCESS RIGHT HIP  Final   Special Requests Normal  Final   Gram Stain   Final    FEW WBC PRESENT, PREDOMINANTLY PMN NO SQUAMOUS EPITHELIAL CELLS SEEN NO ORGANISMS SEEN Performed at [facility]    Culture   Final    NO ANAEROBES ISOLATED Performed at [facility]    Report Status 07/15/2014 FINAL  Final  Culture, blood (routine x 2)     Status: None (Preliminary result)   Collection Time: 07/13/14  3:45 PM  Result Value Ref Range Status   Specimen Description BLOOD RIGHT HAND  Final   Special Requests BOTTLES DRAWN AEROBIC ONLY 3CC  Final   Culture   Final           BLOOD CULTURE RECEIVED NO GROWTH TO DATE CULTURE WILL BE HELD FOR 5 DAYS BEFORE ISSUING A FINAL NEGATIVE REPORT Performed at [facility]    Report Status PENDING  Incomplete  Culture, blood (routine x 2)     Status: None (Preliminary result)   Collection Time: 07/13/14  4:00 PM  Result Value Ref Range Status   Specimen Description BLOOD RIGHT HAND  Final   Special Requests BOTTLES DRAWN AEROBIC ONLY 2CC  Final   Culture   Final           BLOOD CULTURE RECEIVED NO GROWTH TO DATE CULTURE WILL BE HELD FOR 5 DAYS BEFORE ISSUING A FINAL NEGATIVE  REPORT Note: Culture results may be compromised due to an inadequate volume of blood received in culture bottles. Performed at [facility]    Report Status PENDING  Incomplete  Clostridium Difficile by PCR     Status: Abnormal   Collection Time: 07/14/14 10:15 PM  Result Value Ref Range Status   C  difficile by pcr POSITIVE (A) NEGATIVE Final    Comment: CRITICAL RESULT CALLED TO, READ BACK BY AND VERIFIED WITH: Candy Sledge RN 9:45 07/15/14 ([email or web address])     Assessment: 59 yo female continuing on vancomycin/zosyn and fluconazole for possible PNA and peritonitis. She is now s/p Ex-lap with evacuation of ascites, closure of perforated pyloric ulcer and resection of ileum with primary anatomosis. Also on flagyl po for cdiff  CRRT 3/30>>4/2, 4/4>>4/14). No UOP. Now back to IHD - 4/12, 4/13, 4/15  Vancomycin 3/29> Cefepime 3/29>3/30 Zosyn 3/30> Fluconazole 3/20> Flagyl 4/12> (cdiff)  4/3: VR 38.5 ~24h post-crrt, no UOP recorded 4/4: Resuming CRRT about 8 PM  4/5 0400 - VR = 24.7 4/11: VR =32 :58, on vanc 1g IV q24h ( ), on CRRT, not sure why we did not check a trough . 4/11: VT = 29.4 @ 1200; Ke on CRRT = 0.012; T1/2 = 57 hrs; CRRT off at 1800 PM 4/12: VR = 24.7 4/12pm - only tolerated 45 mins of HD and BFR didn't even get up to 400. So, didn't redose. d/c'ed standing vanc order from [facility]. Need to write order tom if need to redose  4/13 HD BFR 200 x3hr no redose - check leve pre next HD 4/15: 27.3 - HD planned today  3/17 flu + 3/30 peritoneal fluid- rare lactobacillus (no sensitivies performed; usually sensitive to PCN) 3/28 blood x2 - neg 3/26 c diff - neg 4/8 anaer: neg 4/8 R hip abscess: neg 4/12 Cdiff + - added po metronidazole 4/11 - BCx > ngtd 4/12 - cdiff: +  Goal of Therapy:  Pre-HD vancomycin trough 15-25  Plan:  - Hold vanc for now, Vanc level 27.5 pre HD 4/15 - f/u need to re-dose post-HD based on how pt tolerates (currently in HD) - Cont  Zosyn 2.25 q 8 hrs - Cont flagyl  po q8h - Reduce fluconazole back to 200 q 24  Babs Bertin, PharmD Clinical Pharmacist - Resident Pager [phone] 07/17/2014 3:20 PM   6:30 pm Patient tolerated HD this PM -- will give 750 mg iv X 1 dose tonight  Thank you. Okey Regal, PharmD

## 2014-07-17 NOTE — Progress Notes (Signed)
Referring Physician(s): CCS  Subjective: Patient c/o minimal soreness at drain site, denies abdominal pain   Allergies: Review of patient's allergies indicates no known allergies.  Medications: Prior to Admission medications   Medication Sig Start Date End Date Taking? Authorizing Provider  allopurinol (ZYLOPRIM) 100 MG tablet Take 100 mg by mouth 2 (two) times daily as needed (for gout).   Yes Historical Provider, MD  citalopram (CELEXA) 20 MG tablet Take 20 mg by mouth daily.   Yes Historical Provider, MD  furosemide (LASIX) 80 MG tablet Take 80 mg by mouth daily.   Yes Historical Provider, MD  hydrOXYzine (ATARAX/VISTARIL) 25 MG tablet Take 25 mg by mouth 3 (three) times daily as needed for anxiety.   Yes Historical Provider, MD  oxyCODONE (ROXICODONE) 5 MG immediate release tablet Take 1 tablet (5 mg total) by mouth every 6 (six) hours as needed for severe pain. Patient not taking: Reported on 06/17/2014 01/28/14   Raymond Gurney, PA-C   Vital Signs: BP 91/64 mmHg  Pulse 104  Temp(Src) 97.2 F (36.2 C) (Oral)  Resp 21  Ht  (1.676 m)  Wt 221 lb 12.5 oz (100.6 kg)  BMI 35.81 kg/m2  SpO2 92%  Physical Exam General: NAD Abd: Soft, NT, ND, Right TG drain intact- minimal output serous 7 cc/24 hrs  Imaging: Ct Abdomen Pelvis Wo Contrast  07/16/2014   CLINICAL DATA:  Followup abdominal abscess  EXAM: CT ABDOMEN AND PELVIS WITHOUT CONTRAST  TECHNIQUE: Multidetector CT imaging of the abdomen and pelvis was performed following the standard protocol without IV contrast. A small amount of oral contrast was administered.  COMPARISON:  07/10/2014/07/09/2014  FINDINGS: The lung bases demonstrate minimal left basilar atelectasis. The heart remains enlarged in size.  The liver, gallbladder, spleen, adrenal glands and pancreas are all normal in their CT appearance. A surgical drain is noted in the left upper quadrant stable from the prior exam. A minimal amount of left upper quadrant  fluid remains adjacent to the spleen. This is roughly stable from the prior exam. Kidneys are stable in appearance. Mild dilatation of the abdominal aorta is noted and stable.  There is been interval placement of a trans gluteal drainage catheter. The amount of pelvic fluid has decreased significantly although a small rim out remains. It now measures approximately 6.4 x 2.4 cm in greatest dimension. This is decreased from 9.0 x 6.2 cm. Calcified uterine fibroids are again seen. No new focal abnormality is noted. The bladder is well distended. No bony abnormality is seen.  IMPRESSION: Interval reduction in the size of pelvic fluid collection. Correlation with current drainage levels is recommended.  The remainder of the exam is stable from the prior studies.   Electronically Signed   By: Alcide Clever M.D.   On: 07/16/2014 11:23   Dg Abd 1 View  07/13/2014   CLINICAL DATA:  Ileus  EXAM: ABDOMEN - 1 VIEW  COMPARISON:  CT abdomen pelvis dated 07/09/2014  FINDINGS: Nonobstructive bowel gas pattern.  Contrast within distal small bowel in the right lower quadrant, ascending colon, and splenic flexure.  Pigtail drain in the right lower quadrant.  Enteric tube in the proximal gastric body.  IMPRESSION: Nonobstructive bowel gas pattern.  Residual contrast in the distal small bowel and colon, as above, unchanged from prior CT and suggesting adynamic colonic ileus.  Pigtail drain in the right lower quadrant.   Electronically Signed   By: Charline Bills M.D.   On: 07/13/2014 12:48  Dg Chest Port 1 View  07/15/2014   CLINICAL DATA:  Encounter for central line placement  EXAM: PORTABLE CHEST - 1 VIEW  COMPARISON:  07/14/2014  FINDINGS: Advanced left IJ catheter, tip now at the SVC level. No interval displacement of right IJ dual-lumen catheter.  Stable cardiomegaly and aortic tortuosity.  There is no edema, consolidation, effusion, or pneumothorax.  IMPRESSION: Advanced or replaced left IJ catheter, tip now at the SVC  level.   Electronically Signed   By: Marnee SpringJonathon  Watts M.D.   On: 07/15/2014 01:51   Dg Chest Port 1 View  07/14/2014   CLINICAL DATA:  59 year old female with left-sided pleural effusion. Shortness breath. Hypertension. Chronic kidney disease. Subsequent encounter.  EXAM: PORTABLE CHEST - 1 VIEW  COMPARISON:  07/12/2014.  FINDINGS: Right split catheter in place with tips in the region of the proximal and proximal to mid superior vena cava.  Left internal jugular catheter tip at the level of the distal left brachiocephalic vein just proximal to formation of the superior vena cava.  No gross pneumothorax.  Cardiomegaly.  Central pulmonary vascular prominence.  No segmental consolidation.  Minimally tortuous aorta.  IMPRESSION: Central pulmonary vascular prominence appears stable.  Nasogastric tube removed.  Cardiomegaly.   Electronically Signed   By: Lacy DuverneySteven  Olson M.D.   On: 07/14/2014 07:29    Labs:  CBC:  Recent Labs  07/14/14 0455 07/15/14 0440 07/16/14 0440 07/17/14 0410  WBC 12.9* 13.4* 12.2* 10.6*  HGB 7.3* 7.1* 8.8* 9.6*  HCT 23.2* 22.3* 27.1* 29.2*  PLT 99* 79* 67* 79*    COAGS:  Recent Labs  07/01/14 1445 07/01/14 1813 07/02/14 0430 07/03/14 0445 07/04/14 0510 07/07/14 0400 07/10/14 0715  INR 1.44 1.83* 1.51*  --   --   --  1.21  APTT  --   --  35 38* 36 41* 37    BMP:  Recent Labs  07/14/14 0455 07/15/14 0440 07/16/14 0440 07/17/14 0410  NA 130* 125* 128* 126*  K 3.4* 3.9 3.5 5.3*  CL 97 93* 96 94*  CO2 21 18* 21 16*  GLUCOSE 102* 126* 139* 70  BUN 68* 81* 68* 86*  CALCIUM 7.9* 7.9* 7.7* 7.8*  CREATININE 2.63* 3.60* 3.53* 4.93*  GFRNONAA 19* 13* 13* 9*  GFRAA 22* 15* 15* 10*    LIVER FUNCTION TESTS:  Recent Labs  07/09/14 0430  07/13/14 0440  07/14/14 0455 07/15/14 0440 07/16/14 0440 07/17/14 0410  BILITOT 2.0*  --  1.9*  --   --   --  1.8* 1.7*  AST 17  --  60*  --   --   --  21 21  ALT 29  --  55*  --   --   --  43* 37*  ALKPHOS 111  --   155*  --   --   --  108 119*  PROT 4.9*  --  5.8*  --   --   --  5.3* 5.9*  ALBUMIN 1.2*  < > 1.7*  < > 1.6* 1.6* 1.6* 1.6*  < > = values in this interval not displayed.  Assessment and Plan: Abdominal/pelvic abscess S/p Right TG perc drain placement 07/10/14 Repeat CT 07/16/14 with improved collection, d/w Dr. Loreta AveWagner no further manipulation needed with catheter, small fluid collection -continue drain irrigations and monitor output.  Plans per CCS  Signed: Berneta LevinsMORGAN, Vear Staton D 07/17/2014, 10:55 AM   I spent a total of 15 Minutes in face to face in clinical consultation/evaluation,  greater than 50% of which was counseling/coordinating care for abdominal/pelvic abscess.

## 2014-07-17 NOTE — Progress Notes (Signed)
[name], female   DOB: Apr 01, 1956, 59 y.o.   MRN: [id number]     [facility]      [address] [place], [place] [address]    Phone: [phone] FAX: [phone]     Subjective: Appetite is fair.  Having diarrhea.  Labs reviewed, k up, HD today.  WBC down.  VSS.  Afebrile.  Minimal output from drains.    Objective:  Vital signs:  Filed Vitals:   07/17/14 0414 07/17/14 0600 07/17/14 0722 07/17/14 0800  BP:  102/67 97/50 91/64  Pulse:  88 90   Temp:   97.2 F (36.2 C)   TempSrc:   Oral   Resp:  _0 Height:      Weight: 100.6 kg (221 lb 12.5 oz)     SpO2:  96% 96%     Last BM Date: 07/16/14  Intake/Output   Yesterday:  04/14 0701 - 04/15 0700 In: 839.5 [P.O.:240; I.V.:113.5; IV Piggyback:250; TPN:236] Out: 13 [Drains:13] This shift:    I/O last 3 completed shifts: In: 2062.5 [P.O.:240; I.V.:113.5; IV Piggyback:450] Out: 23 [Drains:23]    Physical Exam: General: Pt awake/alert/oriented x4 in no acute distress Abdomen: Soft.  Nondistended.  Midline wound-some fibrinous exudate, fascia is intact, wound repacked.  Right abd drain with serosanguinous output.  Right buttock drainage is serous.   No evidence of peritonitis.  No incarcerated hernias.    Problem List:   Principal Problem:   Acute respiratory failure with hypoxia Active Problems:   Cerebral thrombosis with cerebral infarction   Hypertension   ESRD needing dialysis   Obesity (BMI 30-39.9)   Depression   Left renal mass   Chronic diastolic heart failure   D4J0 influenza   Tobacco use disorder   Renal failure (ARF), acute on chronic   Hyperlipidemia   PSVT (paroxysmal supraventricular tachycardia)   Hypoxia   SOB (shortness of breath)   Perforated gastric ulcer   Pelvic fluid collection    Results:   Labs: Results for orders placed or performed during the hospital encounter of 06/17/14 (from the past 48 hour(s))   Glucose, capillary     Status: None   Collection Time: 07/15/14  8:07 AM  Result Value Ref Range   Glucose-Capillary 71 70 - 99 mg/dL   Comment 1 Capillary Specimen   Type and screen     Status: None   Collection Time: 07/15/14  8:30 AM  Result Value Ref Range   ABO/RH(D) B POS    Antibody Screen NEG    Sample Expiration 07/18/2014    Unit Number [id number]    Blood Component Type RED CELLS,LR    Unit division 00    Status of Unit ISSUED,FINAL    Transfusion Status OK TO TRANSFUSE    Crossmatch Result Compatible    Unit Number [id number]    Blood Component Type RED CELLS,LR    Unit division 00    Status of Unit ISSUED,FINAL    Transfusion Status OK TO TRANSFUSE    Crossmatch Result Compatible   Prepare RBC     Status: None   Collection Time: 07/15/14  8:30 AM  Result Value Ref Range   Order Confirmation ORDER PROCESSED BY BLOOD BANK   Glucose, capillary     Status: Abnormal   Collection Time: 07/15/14 11:56 AM  Result Value Ref Range   Glucose-Capillary 114 (H) 70 - 99 mg/dL   Comment 1 Capillary Specimen  Glucose, capillary     Status: Abnormal   Collection Time: 07/15/14  4:10 PM  Result Value Ref Range   Glucose-Capillary 219 (H) 70 - 99 mg/dL   Comment 1 Capillary Specimen   Glucose, capillary     Status: Abnormal   Collection Time: 07/15/14  8:14 PM  Result Value Ref Range   Glucose-Capillary 154 (H) 70 - 99 mg/dL   Comment 1 Capillary Specimen   Glucose, capillary     Status: Abnormal   Collection Time: 07/15/14 11:13 PM  Result Value Ref Range   Glucose-Capillary 138 (H) 70 - 99 mg/dL   Comment 1 Capillary Specimen   Glucose, capillary     Status: Abnormal   Collection Time: 07/16/14  4:29 AM  Result Value Ref Range   Glucose-Capillary 132 (H) 70 - 99 mg/dL   Comment 1 Capillary Specimen   Magnesium     Status: None   Collection Time: 07/16/14  4:40 AM  Result Value Ref Range   Magnesium 1.7 1.5 - 2.5 mg/dL  Comprehensive metabolic panel      Status: Abnormal   Collection Time: 07/16/14  4:40 AM  Result Value Ref Range   Sodium 128 (L) 135 - 145 mmol/L   Potassium 3.5 3.5 - 5.1 mmol/L   Chloride 96 96 - 112 mmol/L   CO2 21 19 - 32 mmol/L   Glucose, Bld 139 (H) 70 - 99 mg/dL   BUN 68 (H) 6 - 23 mg/dL   Creatinine, Ser 3.53 (H) 0.50 - 1.10 mg/dL   Calcium 7.7 (L) 8.4 - 10.5 mg/dL   Total Protein 5.3 (L) 6.0 - 8.3 g/dL   Albumin 1.6 (L) 3.5 - 5.2 g/dL   AST 21 0 - 37 U/L   ALT 43 (H) 0 - 35 U/L   Alkaline Phosphatase 108 39 - 117 U/L   Total Bilirubin 1.8 (H) 0.3 - 1.2 mg/dL   GFR calc non Af Amer 13 (L) >90 mL/min   GFR calc Af Amer 15 (L) >90 mL/min    Comment: (NOTE) The eGFR has been calculated using the CKD EPI equation. This calculation has not been validated in all clinical situations. eGFR's persistently <90 mL/min signify possible Chronic Kidney Disease.    Anion gap 11 5 - 15  Phosphorus     Status: None   Collection Time: 07/16/14  4:40 AM  Result Value Ref Range   Phosphorus 3.9 2.3 - 4.6 mg/dL  CBC     Status: Abnormal   Collection Time: 07/16/14  4:40 AM  Result Value Ref Range   WBC 12.2 (H) 4.0 - 10.5 K/uL   RBC 3.14 (L) 3.87 - 5.11 MIL/uL   Hemoglobin 8.8 (L) 12.0 - 15.0 g/dL    Comment: DELTA CHECK NOTED REPEATED TO VERIFY    HCT 27.1 (L) 36.0 - 46.0 %   MCV 86.3 78.0 - 100.0 fL   MCH 28.0 26.0 - 34.0 pg   MCHC 32.5 30.0 - 36.0 g/dL   RDW 21.6 (H) 11.5 - 15.5 %   Platelets 67 (L) 150 - 400 K/uL    Comment: REPEATED TO VERIFY PLATELET COUNT CONFIRMED BY SMEAR   Glucose, capillary     Status: Abnormal   Collection Time: 07/16/14  7:45 AM  Result Value Ref Range   Glucose-Capillary 130 (H) 70 - 99 mg/dL   Comment 1 Capillary Specimen   Glucose, capillary     Status: Abnormal   Collection Time: 07/16/14 11:29 AM  Result Value Ref Range   Glucose-Capillary 109 (H) 70 - 99 mg/dL   Comment 1 Capillary Specimen   Glucose, capillary     Status: Abnormal   Collection Time: 07/16/14  4:40 PM   Result Value Ref Range   Glucose-Capillary 128 (H) 70 - 99 mg/dL   Comment 1 Capillary Specimen   CBC     Status: Abnormal   Collection Time: 07/17/14  4:10 AM  Result Value Ref Range   WBC 10.6 (H) 4.0 - 10.5 K/uL   RBC 3.44 (L) 3.87 - 5.11 MIL/uL   Hemoglobin 9.6 (L) 12.0 - 15.0 g/dL   HCT 29.2 (L) 36.0 - 46.0 %   MCV 84.9 78.0 - 100.0 fL   MCH 27.9 26.0 - 34.0 pg   MCHC 32.9 30.0 - 36.0 g/dL   RDW 21.8 (H) 11.5 - 15.5 %   Platelets 79 (L) 150 - 400 K/uL    Comment: CONSISTENT WITH PREVIOUS RESULT  Comprehensive metabolic panel     Status: Abnormal   Collection Time: 07/17/14  4:10 AM  Result Value Ref Range   Sodium 126 (L) 135 - 145 mmol/L   Potassium 5.3 (H) 3.5 - 5.1 mmol/L    Comment: DELTA CHECK NOTED NO VISIBLE HEMOLYSIS    Chloride 94 (L) 96 - 112 mmol/L   CO2 16 (L) 19 - 32 mmol/L   Glucose, Bld 70 70 - 99 mg/dL   BUN 86 (H) 6 - 23 mg/dL   Creatinine, Ser 4.93 (H) 0.50 - 1.10 mg/dL   Calcium 7.8 (L) 8.4 - 10.5 mg/dL   Total Protein 5.9 (L) 6.0 - 8.3 g/dL   Albumin 1.6 (L) 3.5 - 5.2 g/dL   AST 21 0 - 37 U/L   ALT 37 (H) 0 - 35 U/L   Alkaline Phosphatase 119 (H) 39 - 117 U/L   Total Bilirubin 1.7 (H) 0.3 - 1.2 mg/dL   GFR calc non Af Amer 9 (L) >90 mL/min   GFR calc Af Amer 10 (L) >90 mL/min    Comment: (NOTE) The eGFR has been calculated using the CKD EPI equation. This calculation has not been validated in all clinical situations. eGFR's persistently <90 mL/min signify possible Chronic Kidney Disease.    Anion gap 16 (H) 5 - 15  Magnesium     Status: None   Collection Time: 07/17/14  4:10 AM  Result Value Ref Range   Magnesium 1.9 1.5 - 2.5 mg/dL    Imaging / Studies: Ct Abdomen Pelvis Wo Contrast  07/16/2014   CLINICAL DATA:  Followup abdominal abscess  EXAM: CT ABDOMEN AND PELVIS WITHOUT CONTRAST  TECHNIQUE: Multidetector CT imaging of the abdomen and pelvis was performed following the standard protocol without IV contrast. A small amount of oral  contrast was administered.  COMPARISON:  07/10/2014/07/09/2014  FINDINGS: The lung bases demonstrate minimal left basilar atelectasis. The heart remains enlarged in size.  The liver, gallbladder, spleen, adrenal glands and pancreas are all normal in their CT appearance. A surgical drain is noted in the left upper quadrant stable from the prior exam. A minimal amount of left upper quadrant fluid remains adjacent to the spleen. This is roughly stable from the prior exam. Kidneys are stable in appearance. Mild dilatation of the abdominal aorta is noted and stable.  There is been interval placement of a trans gluteal drainage catheter. The amount of pelvic fluid has decreased significantly although a small rim out remains. It now measures approximately 6.4 x 2.4  cm in greatest dimension. This is decreased from 9.0 x 6.2 cm. Calcified uterine fibroids are again seen. No new focal abnormality is noted. The bladder is well distended. No bony abnormality is seen.  IMPRESSION: Interval reduction in the size of pelvic fluid collection. Correlation with current drainage levels is recommended.  The remainder of the exam is stable from the prior studies.   Electronically Signed   By: Inez Catalina M.D.   On: 07/16/2014 11:23    Medications / Allergies:  Scheduled Meds: . darbepoetin (ARANESP) injection - DIALYSIS  150 mcg Intravenous Q Sat-HD  . feeding supplement (RESOURCE BREEZE)  1 Container Oral TID BM  . fluconazole (DIFLUCAN) IV  200 mg Intravenous Q24H  . hydrocortisone sodium succinate  50 mg Intravenous Daily  . insulin aspart  0-9 Units Subcutaneous TID WC  . ipratropium  0.5 mg Nebulization TID  . levalbuterol  0.63 mg Nebulization TID  . magnesium oxide  400 mg Oral BID  . metoprolol tartrate  12.5 mg Oral BID  . metroNIDAZOLE  500 mg Oral 3 times per day  . piperacillin-tazobactam (ZOSYN)  IV  2.25 g Intravenous 3 times per day   Continuous Infusions: . sodium chloride 10 mL/hr at 07/16/14 1939    PRN Meds:.sodium chloride, sodium chloride, anticoagulant sodium citrate, bisacodyl, feeding supplement (NEPRO CARB STEADY), fentaNYL (SUBLIMAZE) injection, HYDROcodone-acetaminophen, lidocaine (PF), lidocaine-prilocaine, metoprolol, morphine injection, ondansetron (ZOFRAN) IV, pentafluoroprop-tetrafluoroeth  Antibiotics: Anti-infectives    Start     Dose/Rate Route Frequency Ordered Stop   07/15/14 1400  metroNIDAZOLE (FLAGYL) tablet 500 mg     500 mg Oral 3 times per day 07/15/14 1202 07/29/14 1359   07/13/14 2200  piperacillin-tazobactam (ZOSYN) IVPB 2.25 g     2.25 g 100 mL/hr over 30 Minutes Intravenous 3 times per day 07/13/14 1318     07/13/14 2000  fluconazole (DIFLUCAN) IVPB 200 mg     200 mg 100 mL/hr over 60 Minutes Intravenous Every 24 hours 07/13/14 1318     07/07/14 1200  vancomycin (VANCOCIN) IVPB 1000 mg/200 mL premix  Status:  Discontinued     1,000 mg 200 mL/hr over 60 Minutes Intravenous Every 24 hours 07/07/14 0937 07/14/14 1712   07/06/14 2000  fluconazole (DIFLUCAN) IVPB 400 mg  Status:  Discontinued     400 mg 100 mL/hr over 120 Minutes Intravenous Every 24 hours 07/06/14 1507 07/13/14 1318   07/06/14 1800  piperacillin-tazobactam (ZOSYN) IVPB 2.25 g  Status:  Discontinued     2.25 g 100 mL/hr over 30 Minutes Intravenous 4 times per day 07/06/14 1505 07/13/14 1318   07/05/14 2000  fluconazole (DIFLUCAN) IVPB 200 mg  Status:  Discontinued     200 mg 100 mL/hr over 60 Minutes Intravenous Every 24 hours 07/05/14 0757 07/06/14 1507   07/05/14 1400  piperacillin-tazobactam (ZOSYN) IVPB 2.25 g  Status:  Discontinued     2.25 g 100 mL/hr over 30 Minutes Intravenous 3 times per day 07/05/14 0749 07/06/14 1505   07/02/14 1800  vancomycin (VANCOCIN) IVPB 1000 mg/200 mL premix  Status:  Discontinued     1,000 mg 200 mL/hr over 60 Minutes Intravenous Every 24 hours 07/01/14 1858 07/05/14 0801   07/01/14 2200  piperacillin-tazobactam (ZOSYN) IVPB 3.375 g  Status:   Discontinued     3.375 g 100 mL/hr over 30 Minutes Intravenous 4 times per day 07/01/14 1858 07/05/14 0749   07/01/14 2000  fluconazole (DIFLUCAN) IVPB 400 mg  Status:  Discontinued  400 mg 100 mL/hr over 120 Minutes Intravenous Every 24 hours 07/01/14 1858 07/05/14 0757   07/01/14 1400  fluconazole (DIFLUCAN) IVPB 200 mg  Status:  Discontinued     200 mg 100 mL/hr over 60 Minutes Intravenous Every 24 hours 07/01/14 1330 07/01/14 1853   07/01/14 1000  piperacillin-tazobactam (ZOSYN) IVPB 2.25 g  Status:  Discontinued     2.25 g 100 mL/hr over 30 Minutes Intravenous Every 8 hours 07/01/14 0952 07/01/14 1853   07/01/14 1000  vancomycin (VANCOCIN) 500 mg in sodium chloride 0.9 % 100 mL IVPB     500 mg 100 mL/hr over 60 Minutes Intravenous  Once 07/01/14 0952 07/01/14 1126   06/30/14 1200  vancomycin (VANCOCIN) IVPB 1000 mg/200 mL premix     1,000 mg 200 mL/hr over 60 Minutes Intravenous  Once 06/30/14 0944 06/30/14 1403   06/30/14 1200  ceFEPIme (MAXIPIME) 2 g in dextrose 5 % 50 mL IVPB     2 g 100 mL/hr over 30 Minutes Intravenous  Once 06/30/14 0944 06/30/14 1425   06/30/14 0100  vancomycin (VANCOCIN) 2,000 mg in sodium chloride 0.9 % 500 mL IVPB     2,000 mg 250 mL/hr over 120 Minutes Intravenous  Once 06/30/14 0048 06/30/14 0525   06/30/14 0100  ceFEPIme (MAXIPIME) 2 g in dextrose 5 % 50 mL IVPB     2 g 100 mL/hr over 30 Minutes Intravenous  Once 06/30/14 0048 06/30/14 0322   06/27/14 0600  cefUROXime (ZINACEF) 1.5 g in dextrose 5 % 50 mL IVPB     1.5 g 100 mL/hr over 30 Minutes Intravenous On call to O.R. 06/26/14 1019 06/27/14 0630   06/19/14 1800  oseltamivir (TAMIFLU) capsule 30 mg     30 mg Oral Every M-W-F (1800) 06/19/14 1003 06/22/14 1841   06/18/14 1600  oseltamivir (TAMIFLU) capsule 30 mg  Status:  Discontinued     30 mg Oral Daily 06/18/14 1538 06/19/14 1003       Assessment/Plan POD #16 s/p EXPLORATORY LAPAROTOMY WITH CLOSURE OF PERFORATED PYLORIC ULCER, SMALL  BOWEL RESECTION - 07/01/2014 - Dalbert Batman Post-operative ileus Intra-abdominal abscess Cefepime 3/29--->3/30 Zosyn 3/30---> Fluconazole 3/30---> -On soft diet -Open wound-fibrinous exudate, needs to be changed BID, use 4x4 guaze, not drain sponges.  -Needs to mobilize to aid in recovery -Use IS -repeat CT abdomen 4/14--->decreased abscess size.  ?could drain catheter be repositioned, will d/w IR -right abdomen drain 47m serosanguinous -right buttock drain 78mserous -Cx negative to date Leukocytosis -WBC down  Anemia - 9.6 - improved ESRD - on HD now PCM/TPN -start calorie count.  If PO intake is adequate DC TNA Stroke - 06/19/2014 C Diff colitis  dCHF DM PNA -Vanc per pharm 3/29 -Left hemiplegia - this will probably limit her mobility and return to normal activities Activity - PT following On Solucortef - Started 4/7   [facility] Pager [phone](7A-4:30P) For consults and floor pages call [phone](7A-4:30P)  07/17/2014 8:12 AM

## 2014-07-17 NOTE — Progress Notes (Signed)
Physical Therapy Treatment Patient Details Name: [name] MRN: [id number] DOB: 1955/04/18 Today's Date: 07/17/2014    History of Present Illness [name] is a 59 y.o. female with a history of CKD and hypertension [facility]16 complaining of shortness of breath and generalized weakness x 2 weeks. Admitted with acute hypoxemic respiratory failure. During hospital stay noted to have weakness of her left side. 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  She developed septic shock 4/1 with pressors weaned off 4/3.  Began CRRT and pressors restarted 4/4; Developed pelvic abcess 4/7 with pelvic drain placed 4/8.  weaned off pressors 4/9.  Runs of SVT 4/5 and 4/11    PT Comments    Pt pleasant with mostly flat affect but able to joke and smile at times. Pt incontinent of stool on arrival with multiple times rolling bilaterally for pericare and linen change. EOB after standing pt also noted need for additional voiding and was able to sit on bed pan EOB and stand for pericare with assist . Pt with difficulty shifting weight in standing for pivot to chair but performing well with transfers to sitting and standing today. Will continue to follow and recommend OOB daily with nursing.   Follow Up Recommendations  LTACH;Supervision/Assistance - 24 hour     Equipment Recommendations       Recommendations for Other Services       Precautions / Restrictions Precautions Precautions: Fall Precaution Comments: 2 jp drains, left HP Restrictions Weight Bearing Restrictions: No    Mobility  Bed Mobility Overal bed mobility: Needs Assistance Bed Mobility: Rolling;Sidelying to Sit Rolling: Max assist;Min assist Sidelying to sit: Mod assist       General bed mobility comments: min assist to roll left and max assist to roll right with cues for sequence  extremity placement. Mod assist to sit from right side with cues for sequence and assist to elevate trunk  Transfers Overall transfer level: Needs assistance     Sit to Stand: Mod assist Stand pivot transfers: Max assist;+2 physical assistance       General transfer comment: pt able to stand from elevated surface with left knee blocked x 5 attempts with max cues  and one additional time from recliner. With pivot to chair pt able to take a few steps with right leg but tends to lean left with pivot and required 2 attempts with max +2 assist before able to complete pivot. Pt would not follow commands and sequencing for squat pivot at this time  Ambulation/Gait                 Stairs            Wheelchair Mobility    Modified Rankin (Stroke Patients Only)       Balance     Sitting balance-Leahy Scale: Fair       Standing balance-Leahy Scale: Poor                      Cognition Arousal/Alertness: Awake/alert Behavior During Therapy: WFL for tasks assessed/performed Overall Cognitive Status: Within Functional Limits for tasks assessed                      Exercises      General Comments  Pertinent Vitals/Pain Pain Assessment: 0-10 Pain Location: 4 left sacrum Pain Descriptors / Indicators: Aching Pain Intervention(s): Repositioned  HR 90-104 sats 94% RA    Home Living                      Prior Function            PT Goals (current goals can now be found in the care plan section) Progress towards PT goals: Progressing toward goals    Frequency       PT Plan Current plan remains appropriate    Co-evaluation             End of Session Equipment Utilized During Treatment: Gait belt Activity Tolerance: Patient tolerated treatment well Patient left: in chair;with call bell/phone within reach;with nursing/sitter in room;with family/visitor present     Time: 0919-1002 PT Time Calculation (min) (ACUTE  ONLY): 43 min  Charges:  $Therapeutic Activity: 38-52 mins                    G Codes:      Delorse Lek 2014-07-25, 10:09 AM Delaney Meigs, PT [phone]

## 2014-07-17 NOTE — Progress Notes (Signed)
OT Cancellation Note  Patient Details Name: [name] MRN: [id number] DOB: 12-17-1955   Cancelled Treatment:    Reason Eval/Treat Not Completed: Patient at procedure or test/ unavailable (At CT upon attempt in a.m., HD in p.m.) Will continue to follow.  [name], Essense Bousquet Lynn 07/17/2014, 3:01 PM

## 2014-07-17 NOTE — Progress Notes (Signed)
Assessment:         ESRD new start to HD 1. Perforated pyloric ulcer / necrotic small bowel - s/p repair of perf ulcer w partial ileum resection 3/30 2. Recurrent sepsis - improving s/p perc drainage of pelvic fluid collection; on flagyl/ zosyn/ diflucan 3. S/P acute ACA stroke / left hemiparesis 4. Nutrition / ileus - remains on TNA, hypokalemic 5. Anemia s/p PRBCs 6. R HF - severe by TEE 7. HD access - Permcath placed 3/26 d/t AVF problems; when stable VVS wants to considering revision of AVF 8. pSVT  Plan - HD today  Subjective: Interval History: No new issue, very weak  Objective: Vital signs in last 24 hours: Temp:  [97.2 F (36.2 C)-98.6 F (37 C)] 97.2 F (36.2 C) (04/15 0722) Pulse Rate:  [88-104] 104 (04/15 1005) Resp:  [11-24] 21 (04/15 0800) BP: (91-134)/(50-82) 91/64 mmHg (04/15 0800) SpO2:  [92 %-100 %] 92 % (04/15 1005) Weight:  [100.6 kg (221 lb 12.5 oz)] 100.6 kg (221 lb 12.5 oz) (04/15 0414) Weight change: 3.6 kg (7 lb 15 oz)  Intake/Output from previous day: 04/14 0701 - 04/15 0700 In: 839.5 [P.O.:240; I.V.:113.5; IV Piggyback:250; TPN:236] Out: 13 [Drains:13] Intake/Output this shift: Total I/O In: 130 [P.O.:120; I.V.:10] Out: -   General appearance: alert and cooperative GI: bandaaged Extremities: left upper extrem AVF with pulse  Lab Results:  Recent Labs  07/16/14 0440 07/17/14 0410  WBC 12.2* 10.6*  HGB 8.8* 9.6*  HCT 27.1* 29.2*  PLT 67* 79*   BMET:  Recent Labs  07/16/14 0440 07/17/14 0410  NA 128* 126*  K 3.5 5.3*  CL 96 94*  CO2 21 16*  GLUCOSE 139* 70  BUN 68* 86*  CREATININE 3.53* 4.93*  CALCIUM 7.7* 7.8*   No results for input(s): PTH in the last 72 hours. Iron Studies: No results for input(s): IRON, TIBC, TRANSFERRIN, FERRITIN in the last 72 hours. Studies/Results: Ct Abdomen Pelvis Wo Contrast  07/16/2014   CLINICAL DATA:  Followup abdominal abscess  EXAM: CT ABDOMEN AND PELVIS WITHOUT CONTRAST  TECHNIQUE:  Multidetector CT imaging of the abdomen and pelvis was performed following the standard protocol without IV contrast. A small amount of oral contrast was administered.  COMPARISON:  07/10/2014/07/09/2014  FINDINGS: The lung bases demonstrate minimal left basilar atelectasis. The heart remains enlarged in size.  The liver, gallbladder, spleen, adrenal glands and pancreas are all normal in their CT appearance. A surgical drain is noted in the left upper quadrant stable from the prior exam. A minimal amount of left upper quadrant fluid remains adjacent to the spleen. This is roughly stable from the prior exam. Kidneys are stable in appearance. Mild dilatation of the abdominal aorta is noted and stable.  There is been interval placement of a trans gluteal drainage catheter. The amount of pelvic fluid has decreased significantly although a small rim out remains. It now measures approximately 6.4 x 2.4 cm in greatest dimension. This is decreased from 9.0 x 6.2 cm. Calcified uterine fibroids are again seen. No new focal abnormality is noted. The bladder is well distended. No bony abnormality is seen.  IMPRESSION: Interval reduction in the size of pelvic fluid collection. Correlation with current drainage levels is recommended.  The remainder of the exam is stable from the prior studies.   Electronically Signed   By: Alcide CleverMark  Lukens M.D.   On: 07/16/2014 11:23   Scheduled: . darbepoetin (ARANESP) injection - DIALYSIS  150 mcg Intravenous Q Sat-HD  .  feeding supplement (RESOURCE BREEZE)  1 Container Oral TID BM  . fluconazole (DIFLUCAN) IV  200 mg Intravenous Q24H  . hydrocortisone sodium succinate  50 mg Intravenous Daily  . insulin aspart  0-9 Units Subcutaneous TID WC  . ipratropium  0.5 mg Nebulization TID  . levalbuterol  0.63 mg Nebulization TID  . magnesium oxide  400 mg Oral BID  . metoprolol tartrate  12.5 mg Oral BID  . metroNIDAZOLE  500 mg Oral 3 times per day  . piperacillin-tazobactam (ZOSYN)  IV   2.25 g Intravenous 3 times per day     LOS: 30 days   Harner,Ezri Fanguy C 07/17/2014,10:33 AM

## 2014-07-17 NOTE — Progress Notes (Signed)
Belongings sent to 6east room 9, report given to RN, pt will be transferred to 6700 after dialysis.

## 2014-07-17 NOTE — Progress Notes (Signed)
[facility] TEAM 1 - Stepdown/ICU TEAM Progress Note  [name] ZOX:[id number] DOB: [birth year] DOA: 06/17/2014 PCP: [name], MD  Admit HPI / Brief Narrative: 59 yo female smoker with hx HTN, ESRD on HD (just began this admit), COPD, failed L AV fistula, initially admitted 3/16 with flu and acute hypoxic resp failure. Ultimately tx to [facility] due to need for HD. 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 Made little improvement and on 3/30 had worsening hypoxia, generalized pain, hyperkalemia, worsening leukocytosis and PCCM consulted.   Significant Events: 3/16 influenza A+ 3/18 R ACA stroke 3/19 MRI multifocal acute infarction of both hemispheres, large right distal ACA 3/22 TEE severe RAE, severely reduced RV function, severe TR, no LAA thrombus, small PFO on TEE 3/31 to OR ex lap for per pyloric ulcer and sm bowel resection 4/1 septic shock on pressors 4/3 pressors weaned off  4/4 restart pressors, CRRT 4/6 levo switched to neo 4/7 pelvic abscess on CT 4/8 IR placed pelvic drain 4/9 off pressors 4/11 long run of SVT  HPI/Subjective: The patient is resting comfortably in a bedside chair.  She reports that she is tolerating her diet well.  She reports that her watery diarrhea persists.  She denies shortness of breath chest pain nausea vomiting or headache.  Assessment/Plan:  C. difficile colitis Oral Flagyl - cont to follow - no evidence of complications at present  Acute hypoxic respiratory failure secondary to H1 N1 influenza +/- HCAP Resolved - stable on room air  PSVT Has been evaluated by EP - TSH is normal - Cardiology following - follow K+ and Mg   Small PFO Has been evaluated by Cards  ACA CVA with Lt sided weakness Will need ongoing long term rehab - PT/OT to cont to follow   ESRD new start HD this admit  Ongoing hemodialysis per Nephrology - permcath placed 3/26 d/t AVF problems; when stable VVS wants to considering revision  of AVF  Status post exploratory laparotomy with closure of perforated pyloric ulcer / small bowel resection w/ postop ileus General Surgery continues to follow and is advancing diet - now off TNA and on regular diet  Pelvic abscess status post percutaneous drain placement 4/8 Ongoing care per general surgery and IR  Chronic diastolic congestive heart failure w/ Severe RHF EF 60-65%, grade 1 diastolic dysfunction  Anemia of critical illness and chronic disease Hgb presently stable s/p 2U PRBC 4/13 - follow   Thrombocytopenia Serotonin assay negative - plt count now climbing again - follow trend - no evidence of blood loss at present   COPD Stable at present  Diabetes mellitus 2 CBG reasonably controlled - cont to follow   Obesity - Body mass index is 35.81 kg/(m^2).  Code Status: FULL Family Communication: no family present at time of exam Disposition Plan: Transfer to nephrology floor - continue PT/OT - will need long-term rehabilitation stay in hemodialysis capable nursing home or LTAC   Consultants: Cardiology Nephrology PCCM Gen Surgery   Antibiotics: Zosyn > Oral Flagyl 4/13 >  DVT prophylaxis: SCDs  Objective: Blood pressure 91/64, pulse 104, temperature 97.2 F (36.2 C), temperature source Oral, resp. rate 21, height  (1.676 m), weight 100.6 kg (221 lb 12.5 oz), SpO2 92 %.  Intake/Output Summary (Last 24 hours) at 07/17/14 1149 Last data filed at 07/17/14 1000  Gross per 24 hour  Intake  743.5 ml  Output     13 ml  Net  730.5 ml   Exam: General: No acute respiratory distress - alert and conversant  Lungs: Clear to auscultation bilaterally without wheezes or crackles Cardiovascular: Regular rate and rhythm without murmur gallop rub Abdomen: Abdominal wound is dressed and dry, abdomen nondistended, no appreciable mass, drain intact  Extremities: No significant cyanosis, clubbing, edema bilateral lower extremities  Data Reviewed: Basic Metabolic  Panel:  Recent Labs Lab 07/13/14 0440 07/13/14 1600 07/14/14 0455 07/15/14 0440 07/16/14 0440 07/17/14 0410  NA 131* 130* 130* 125* 128* 126*  K 4.0 3.9 3.4* 3.9 3.5 5.3*  CL 100 98 97 93* 96 94*  CO2 26 23 21  18* 21 16*  GLUCOSE 133* 191* 102* 126* 139* 70  BUN 44* 42* 68* 81* 68* 86*  CREATININE 1.81* 1.75* 2.63* 3.60* 3.53* 4.93*  CALCIUM 8.0* 7.9* 7.9* 7.9* 7.7* 7.8*  MG 2.1  --  2.0 1.8 1.7 1.9  PHOS 1.7* 3.2 2.6 3.7 3.9  --     Liver Function Tests:  Recent Labs Lab 07/13/14 0440 07/13/14 1600 07/14/14 0455 07/15/14 0440 07/16/14 0440 07/17/14 0410  AST 60*  --   --   --  21 21  ALT 55*  --   --   --  43* 37*  ALKPHOS 155*  --   --   --  108 119*  BILITOT 1.9*  --   --   --  1.8* 1.7*  PROT 5.8*  --   --   --  5.3* 5.9*  ALBUMIN 1.7* 1.8* 1.6* 1.6* 1.6* 1.6*   CBC:  Recent Labs Lab 07/11/14 0557 07/12/14 0430 07/13/14 0440 07/14/14 0455 07/15/14 0440 07/16/14 0440 07/17/14 0410  WBC 23.0* 20.6* 46.7* 12.9* 13.4* 12.2* 10.6*  NEUTROABS 20.7* 18.8* 39.2* 9.7* 10.7*  --   --   HGB 7.1* 7.0* 7.1* 7.3* 7.1* 8.8* 9.6*  HCT 21.7* 21.7* 22.8* 23.2* 22.3* 27.1* 29.2*  MCV 83.1 84.8 85.1 86.2 87.8 86.3 84.9  PLT 130* 108* 97* 99* 79* 67* 79*    CBG:  Recent Labs Lab 07/16/14 0429 07/16/14 0745 07/16/14 1129 07/16/14 1640 07/17/14 0754  GLUCAP 132* 130* 109* 128* 79    Recent Results (from the past 240 hour(s))  Culture, routine-abscess     Status: None   Collection Time: 07/10/14  5:15 PM  Result Value Ref Range Status   Specimen Description ABSCESS RIGHT HIP  Final   Special Requests Normal  Final   Gram Stain   Final    FEW WBC PRESENT,BOTH PMN AND MONONUCLEAR NO SQUAMOUS EPITHELIAL CELLS SEEN NO ORGANISMS SEEN Performed at [facility]    Culture   Final    NO GROWTH 3 DAYS Performed at [facility]    Report Status 07/14/2014 FINAL  Final  Anaerobic culture     Status: None   Collection Time: 07/10/14  5:15 PM    Result Value Ref Range Status   Specimen Description ABSCESS RIGHT HIP  Final   Special Requests Normal  Final   Gram Stain   Final    FEW WBC PRESENT, PREDOMINANTLY PMN NO SQUAMOUS EPITHELIAL CELLS SEEN NO ORGANISMS SEEN Performed at [facility]    Culture   Final    NO ANAEROBES ISOLATED Performed at [facility]    Report Status 07/15/2014 FINAL  Final  Culture, blood (routine x 2)     Status: None (Preliminary result)   Collection Time: 07/13/14  3:45 PM  Result Value Ref Range Status  Specimen Description BLOOD RIGHT HAND  Final   Special Requests BOTTLES DRAWN AEROBIC ONLY 3CC  Final   Culture   Final           BLOOD CULTURE RECEIVED NO GROWTH TO DATE CULTURE WILL BE HELD FOR 5 DAYS BEFORE ISSUING A FINAL NEGATIVE REPORT Performed at [facility]    Report Status PENDING  Incomplete  Culture, blood (routine x 2)     Status: None (Preliminary result)   Collection Time: 07/13/14  4:00 PM  Result Value Ref Range Status   Specimen Description BLOOD RIGHT HAND  Final   Special Requests BOTTLES DRAWN AEROBIC ONLY 2CC  Final   Culture   Final           BLOOD CULTURE RECEIVED NO GROWTH TO DATE CULTURE WILL BE HELD FOR 5 DAYS BEFORE ISSUING A FINAL NEGATIVE REPORT Note: Culture results may be compromised due to an inadequate volume of blood received in culture bottles. Performed at [facility]    Report Status PENDING  Incomplete  Clostridium Difficile by PCR     Status: Abnormal   Collection Time: 07/14/14 10:15 PM  Result Value Ref Range Status   C difficile by pcr POSITIVE (A) NEGATIVE Final    Comment: CRITICAL RESULT CALLED TO, READ BACK BY AND VERIFIED WITH: Candy Sledge RN 9:45 07/15/14 ([email or web address])      Studies:   Recent x-ray studies have been reviewed in detail by the Attending Physician  Scheduled Meds:  Scheduled Meds: . darbepoetin (ARANESP) injection - DIALYSIS  150 mcg Intravenous Q Sat-HD  . feeding supplement (RESOURCE  BREEZE)  1 Container Oral TID BM  . fluconazole (DIFLUCAN) IV  200 mg Intravenous Q24H  . hydrocortisone sodium succinate  50 mg Intravenous Daily  . insulin aspart  0-9 Units Subcutaneous TID WC  . ipratropium  0.5 mg Nebulization TID  . levalbuterol  0.63 mg Nebulization TID  . magnesium oxide  400 mg Oral BID  . metoprolol tartrate  12.5 mg Oral BID  . metroNIDAZOLE  500 mg Oral 3 times per day  . piperacillin-tazobactam (ZOSYN)  IV  2.25 g Intravenous 3 times per day    Time spent on care of this patient: 35 mins   Breon Rehm T , MD   [facility] Office  [phone] Pager - Text Page per Loretha Stapler as per below:  On-Call/Text Page:      [email or web address]      password [email or web address]  If 7PM-7AM, please contact night-coverage [email or web address] Password [email or web address] 07/17/2014, 11:49 AM   LOS: 30 days

## 2014-07-17 NOTE — Progress Notes (Signed)
tx stopped with 30 mins left d/t increased HR>130 for sustained episodes. Pt given metoprolol 5mg  IV; HR came down to 90's. Pt alert and oriented and conversing with staff; able to respond to questions. Trinna PostAlex, RN made aware in report.

## 2014-07-18 DIAGNOSIS — N170 Acute kidney failure with tubular necrosis: Secondary | ICD-10-CM

## 2014-07-18 LAB — GLUCOSE, CAPILLARY
GLUCOSE-CAPILLARY: 66 mg/dL — AB (ref 70–99)
Glucose-Capillary: 79 mg/dL (ref 70–99)
Glucose-Capillary: 120 mg/dL — ABNORMAL HIGH (ref 70–99)
Glucose-Capillary: 141 mg/dL — ABNORMAL HIGH (ref 70–99)
Glucose-Capillary: 93 mg/dL (ref 70–99)

## 2014-07-18 MED ORDER — VANCOMYCIN 50 MG/ML ORAL SOLUTION
125.0000 mg | Freq: Four times a day (QID) | ORAL | Status: DC
  Administered 2014-07-18 – 2014-08-03 (×57): 125 mg via ORAL
  Filled 2014-07-18 (×71): qty 2.5

## 2014-07-18 MED ORDER — LORAZEPAM 1 MG PO TABS
1.0000 mg | ORAL_TABLET | Freq: Once | ORAL | Status: AC
  Administered 2014-07-18: 1 mg via ORAL
  Filled 2014-07-18: qty 1

## 2014-07-18 MED ORDER — ZOLPIDEM TARTRATE 5 MG PO TABS
5.0000 mg | ORAL_TABLET | Freq: Once | ORAL | Status: AC
  Administered 2014-07-18: 5 mg via ORAL
  Filled 2014-07-18: qty 1

## 2014-07-18 MED ORDER — SODIUM CHLORIDE 0.9 % IJ SOLN
10.0000 mL | INTRAMUSCULAR | Status: DC | PRN
  Administered 2014-07-19: 20 mL
  Administered 2014-07-19: 40 mL
  Administered 2014-07-21 (×2): 20 mL
  Administered 2014-07-22: 10 mL
  Administered 2014-07-22 – 2014-07-23 (×2): 20 mL
  Administered 2014-07-24: 10 mL
  Administered 2014-07-25 – 2014-07-26 (×4): 30 mL
  Administered 2014-07-27 – 2014-08-18 (×9): 10 mL
  Administered 2014-08-28 (×2): 30 mL
  Administered 2014-09-02: 10 mL
  Administered 2014-09-02: 20 mL
  Administered 2014-09-09 – 2014-09-11 (×3): 10 mL
  Filled 2014-07-18 (×28): qty 40

## 2014-07-18 MED ORDER — SODIUM CHLORIDE 0.9 % IJ SOLN
10.0000 mL | Freq: Two times a day (BID) | INTRAMUSCULAR | Status: DC
  Administered 2014-07-20 – 2014-07-23 (×2): 10 mL
  Administered 2014-07-26: 20 mL
  Administered 2014-07-27 – 2014-08-14 (×19): 10 mL
  Administered 2014-08-15: 20 mL
  Administered 2014-08-15 – 2014-08-29 (×21): 10 mL
  Administered 2014-08-29: 20 mL
  Administered 2014-08-30: 40 mL
  Administered 2014-08-30 – 2014-09-09 (×12): 10 mL

## 2014-07-18 NOTE — Progress Notes (Signed)
Clinical Social Work Department BRIEF PSYCHOSOCIAL ASSESSMENT 07/18/2014  Patient:  [name]     Account Number:  [id number]     Admit date:  07/08/2014  Clinical Social Worker:  Ulyess Blossom  Date/Time:  07/18/2014 02:59 PM  Referred by:  Physician  Date Referred:  07/18/2014 Referred for  Psychosocial assessment   Other Referral:   Interview type:  Family Other interview type:   Database review.    PSYCHOSOCIAL DATA Living Status:  FAMILY Admitted from facility:   Level of care:   Primary support name:  lise (pronounced lisa) Primary support relationship to patient:  CHILD, ADULT Degree of support available:   Good.  [name] is pt's HCPOA and she expresses that she and her mother are very close.  pt also has a son, [name] who is also closely involved in pt's care.    CURRENT CONCERNS Current Concerns  Post-Acute Placement   Other Concerns:   Residential hospice placement.    SOCIAL WORK ASSESSMENT / PLAN CSW met with pt's daughter at bedside to offer choice and discuss residential hospice placement.  Daughter prefers [facility] as her first choice and [facility] as her second choice.  CSW discussed referral process and offered pt's daughter emotional support during this difficult time.  CSW will continue to follow and facilitate hospice tx as apporpriate.   Assessment/plan status:  Information/Referral to [facility] Other assessment/ plan:   CSW will follow up with both [facility] re: bed availabliity.   Information/referral to community resources:    PATIENT'S/FAMILY'S RESPONSE TO PLAN OF CARE: Pt's daughter calm and soft spoken.  She realizes that her mom is very ill and at the end of her life.  She hopes that pt can move to [facility] if she survives this hospitalization.

## 2014-07-18 NOTE — Progress Notes (Signed)
     SUBJECTIVE: no chest pain or dyspnea; no palpitations  BP 102/69 mmHg  Pulse 97  Temp(Src) 97.7 F (36.5 C) (Oral)  Resp 17  Ht 5\' 6"  (1.676 m)  Wt 216 lb 0.8 oz (98 kg)  BMI 34.89 kg/m2  SpO2 95%  Intake/Output Summary (Last 24 hours) at 07/18/14 0805 Last data filed at 07/18/14 0100  Gross per 24 hour  Intake    620 ml  Output  -1581 ml  Net   2201 ml    PHYSICAL EXAM General: Well developed, chronically ill appearing in no acute distress. Alert and oriented x 3.  HEENT: normal Neck: supple Lungs: Clear bilaterally with no wheezes or rhonci noted.  Heart: RRR with no murmurs noted. Abdomen: Drains in place; not tender or distended Extremities: No edema.   LABS: Basic Metabolic Panel:  Recent Labs  40/01/2703/14/16 0440 07/17/14 0410  NA 128* 126*  K 3.5 5.3*  CL 96 94*  CO2 21 16*  GLUCOSE 139* 70  BUN 68* 86*  CREATININE 3.53* 4.93*  CALCIUM 7.7* 7.8*  MG 1.7 1.9  PHOS 3.9  --    CBC:  Recent Labs  07/16/14 0440 07/17/14 0410  WBC 12.2* 10.6*  HGB 8.8* 9.6*  HCT 27.1* 29.2*  MCV 86.3 84.9  PLT 67* 79*    Current Meds: . darbepoetin (ARANESP) injection - DIALYSIS  150 mcg Intravenous Q Sat-HD  . feeding supplement (RESOURCE BREEZE)  1 Container Oral TID BM  . fluconazole (DIFLUCAN) IV  200 mg Intravenous Q24H  . insulin aspart  0-9 Units Subcutaneous TID WC  . ipratropium  0.5 mg Nebulization TID  . levalbuterol  0.63 mg Nebulization TID  . metoprolol tartrate  12.5 mg Oral BID  . metroNIDAZOLE  500 mg Oral 3 times per day  . piperacillin-tazobactam (ZOSYN)  IV  2.25 g Intravenous 3 times per day     ASSESSMENT AND PLAN: 59 year old female with history of SVT, end-stage renal disease on hemodialysis, hypertension, resolved acute hypoxemic respiratory failure secondary to the flu who was noted to have weakness on her left side during hospital stay with CT scan demonstrating acute infarct of right anterior cerebral artery territory. Small PFO  noted by transthoracic echo, no DVT/SVT by dopplers 06/18/14 .  TEE 06/23/14: no LAA thrombus, severe RAE; mild RVE; severely reduced RV function; severe TR; bowing of atrial septum right to left; positive saline microcavitation study She was seen by Dr. Anne FuSkains on 06/24/14 and felt to have SVT or atrial tachycardia but no atrial fibrillation. She was seen by Dr. Graciela HusbandsKlein on 06/25/14 and he recommended 30 day event monitor at time of discharge. Since then she has had a prolonged hospitalization complicated by perforated pyloric ulcer. She has had episodes of recurrence of SVT.   1. PSVT: She has been seen during this admission by our EP team. 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 Recent episodes have limited dialysis. She is in sinus now. TSH normal this admission. Will continue beta blocker for now. SVT most likely partially driven by hyperadrenergic state associated with present illness. Hopefully this will improve as her clinical condition improves. If she has more frequent episodes limiting dialysis and blood pressure becomes an issue we'll treat with amiodarone.  2. ESRD: Per nephrology   Olga MillersBrian Mahima Hottle  4/16/20168:05 AM

## 2014-07-18 NOTE — Progress Notes (Signed)
[name], female   DOB: 09/11/1955, 59 y.o.   MRN: [id number]     [facility]      [address] [place], [place] [address]    Phone: [phone] FAX: [phone]     Subjective: Still having diarrhea.  Appetite is okay.  Increased pain in the evening time only.   Objective:  Vital signs:  Filed Vitals:   07/17/14 2253 07/18/14 0607 07/18/14 0849 07/18/14 1009  BP:  102/69  112/72  Pulse: 88 97  97  Temp:  97.7 F (36.5 C)  98.8 F (37.1 C)  TempSrc:  Oral  Oral  Resp: _0 Height:      Weight:      SpO2:  95% 96% 96%    Last BM Date: 07/17/14  Intake/Output   Yesterday:  04/15 0701 - 04/16 0700 In: 630 [P.O.:600; I.V.:30] Out: -1581 [Drains:2] This shift:  Total I/O In: 290 [P.O.:240; IV Piggyback:50] Out: -    Physical Exam: General: Pt awake/alert/oriented x4 in no acute distress Abdomen: Soft. Nondistended. Midline wound-dressing is dry. Right abd drain with serosanguinous output. Right buttock drainage is serous. No evidence of peritonitis. No incarcerated hernias.   Problem List:   Principal Problem:   Acute respiratory failure with hypoxia Active Problems:   Cerebral thrombosis with cerebral infarction   Hypertension   ESRD needing dialysis   Obesity (BMI 30-39.9)   Depression   Left renal mass   Chronic diastolic heart failure   U8E2 influenza   Tobacco use disorder   Renal failure (ARF), acute on chronic   Hyperlipidemia   PSVT (paroxysmal supraventricular tachycardia)   Hypoxia   SOB (shortness of breath)   Perforated gastric ulcer   Pelvic fluid collection   Abdominal abscess    Results:   Labs: Results for orders placed or performed during the hospital encounter of 06/17/14 (from the past 48 hour(s))  Glucose, capillary     Status: Abnormal   Collection Time: 07/16/14 11:29 AM  Result Value Ref Range   Glucose-Capillary 109 (H) 70 - 99 mg/dL   Comment 1 Capillary Specimen   Glucose, capillary     Status: Abnormal   Collection Time: 07/16/14  4:40 PM  Result Value Ref Range   Glucose-Capillary 128 (H) 70 - 99 mg/dL   Comment 1 Capillary Specimen   CBC     Status: Abnormal   Collection Time: 07/17/14  4:10 AM  Result Value Ref Range   WBC 10.6 (H) 4.0 - 10.5 K/uL   RBC 3.44 (L) 3.87 - 5.11 MIL/uL   Hemoglobin 9.6 (L) 12.0 - 15.0 g/dL   HCT 29.2 (L) 36.0 - 46.0 %   MCV 84.9 78.0 - 100.0 fL   MCH 27.9 26.0 - 34.0 pg   MCHC 32.9 30.0 - 36.0 g/dL   RDW 21.8 (H) 11.5 - 15.5 %   Platelets 79 (L) 150 - 400 K/uL    Comment: CONSISTENT WITH PREVIOUS RESULT  Comprehensive metabolic panel     Status: Abnormal   Collection Time: 07/17/14  4:10 AM  Result Value Ref Range   Sodium 126 (L) 135 - 145 mmol/L   Potassium 5.3 (H) 3.5 - 5.1 mmol/L    Comment: DELTA CHECK NOTED NO VISIBLE HEMOLYSIS    Chloride 94 (L) 96 - 112 mmol/L   CO2 16 (L) 19 - 32 mmol/L   Glucose, Bld 70 70 - 99 mg/dL  BUN 86 (H) 6 - 23 mg/dL   Creatinine, Ser 4.93 (H) 0.50 - 1.10 mg/dL   Calcium 7.8 (L) 8.4 - 10.5 mg/dL   Total Protein 5.9 (L) 6.0 - 8.3 g/dL   Albumin 1.6 (L) 3.5 - 5.2 g/dL   AST 21 0 - 37 U/L   ALT 37 (H) 0 - 35 U/L   Alkaline Phosphatase 119 (H) 39 - 117 U/L   Total Bilirubin 1.7 (H) 0.3 - 1.2 mg/dL   GFR calc non Af Amer 9 (L) >90 mL/min   GFR calc Af Amer 10 (L) >90 mL/min    Comment: (NOTE) The eGFR has been calculated using the CKD EPI equation. This calculation has not been validated in all clinical situations. eGFR's persistently <90 mL/min signify possible Chronic Kidney Disease.    Anion gap 16 (H) 5 - 15  Magnesium     Status: None   Collection Time: 07/17/14  4:10 AM  Result Value Ref Range   Magnesium 1.9 1.5 - 2.5 mg/dL  Glucose, capillary     Status: None   Collection Time: 07/17/14  7:54 AM  Result Value Ref Range   Glucose-Capillary 79 70 - 99 mg/dL   Comment 1 Capillary Specimen   Vancomycin, random     Status:  None   Collection Time: 07/17/14  8:22 AM  Result Value Ref Range   Vancomycin Rm 27.3 ug/mL    Comment:        Random Vancomycin therapeutic range is dependent on dosage and time of specimen collection. A peak range is 20.0-40.0 ug/mL A trough range is 5.0-15.0 ug/mL          Glucose, capillary     Status: None   Collection Time: 07/17/14 12:22 PM  Result Value Ref Range   Glucose-Capillary 89 70 - 99 mg/dL   Comment 1 Capillary Specimen   Glucose, capillary     Status: None   Collection Time: 07/17/14  6:35 PM  Result Value Ref Range   Glucose-Capillary 79 70 - 99 mg/dL  Glucose, capillary     Status: Abnormal   Collection Time: 07/18/14  7:43 AM  Result Value Ref Range   Glucose-Capillary 66 (L) 70 - 99 mg/dL   Comment 1 Notify RN    Comment 2 Document in Chart     Imaging / Studies: No results found.  Medications / Allergies:  Scheduled Meds: . darbepoetin (ARANESP) injection - DIALYSIS  150 mcg Intravenous Q Sat-HD  . feeding supplement (RESOURCE BREEZE)  1 Container Oral TID BM  . fluconazole (DIFLUCAN) IV  200 mg Intravenous Q24H  . insulin aspart  0-9 Units Subcutaneous TID WC  . ipratropium  0.5 mg Nebulization TID  . levalbuterol  0.63 mg Nebulization TID  . metoprolol tartrate  12.5 mg Oral BID  . metroNIDAZOLE  500 mg Oral 3 times per day  . piperacillin-tazobactam (ZOSYN)  IV  2.25 g Intravenous 3 times per day   Continuous Infusions:  PRN Meds:.sodium chloride, sodium chloride, sodium chloride, sodium chloride, anticoagulant sodium citrate, bisacodyl, feeding supplement (NEPRO CARB STEADY), fentaNYL (SUBLIMAZE) injection, HYDROcodone-acetaminophen, lidocaine (PF), lidocaine-prilocaine, metoprolol, morphine injection, ondansetron (ZOFRAN) IV, pentafluoroprop-tetrafluoroeth  Antibiotics: Anti-infectives    Start     Dose/Rate Route Frequency Ordered Stop   07/17/14 1830  vancomycin (VANCOCIN) IVPB 750 mg/150 ml premix     750 mg 150 mL/hr over 60  Minutes Intravenous  Once 07/17/14 1818 07/18/14 0024   07/15/14 1400  metroNIDAZOLE (FLAGYL) tablet  500 mg     500 mg Oral 3 times per day 07/15/14 1202 07/29/14 1359   07/13/14 2200  piperacillin-tazobactam (ZOSYN) IVPB 2.25 g     2.25 g 100 mL/hr over 30 Minutes Intravenous 3 times per day 07/13/14 1318     07/13/14 2000  fluconazole (DIFLUCAN) IVPB 200 mg     200 mg 100 mL/hr over 60 Minutes Intravenous Every 24 hours 07/13/14 1318     07/07/14 1200  vancomycin (VANCOCIN) IVPB 1000 mg/200 mL premix  Status:  Discontinued     1,000 mg 200 mL/hr over 60 Minutes Intravenous Every 24 hours 07/07/14 0937 07/14/14 1712   07/06/14 2000  fluconazole (DIFLUCAN) IVPB 400 mg  Status:  Discontinued     400 mg 100 mL/hr over 120 Minutes Intravenous Every 24 hours 07/06/14 1507 07/13/14 1318   07/06/14 1800  piperacillin-tazobactam (ZOSYN) IVPB 2.25 g  Status:  Discontinued     2.25 g 100 mL/hr over 30 Minutes Intravenous 4 times per day 07/06/14 1505 07/13/14 1318   07/05/14 2000  fluconazole (DIFLUCAN) IVPB 200 mg  Status:  Discontinued     200 mg 100 mL/hr over 60 Minutes Intravenous Every 24 hours 07/05/14 0757 07/06/14 1507   07/05/14 1400  piperacillin-tazobactam (ZOSYN) IVPB 2.25 g  Status:  Discontinued     2.25 g 100 mL/hr over 30 Minutes Intravenous 3 times per day 07/05/14 0749 07/06/14 1505   07/02/14 1800  vancomycin (VANCOCIN) IVPB 1000 mg/200 mL premix  Status:  Discontinued     1,000 mg 200 mL/hr over 60 Minutes Intravenous Every 24 hours 07/01/14 1858 07/05/14 0801   07/01/14 2200  piperacillin-tazobactam (ZOSYN) IVPB 3.375 g  Status:  Discontinued     3.375 g 100 mL/hr over 30 Minutes Intravenous 4 times per day 07/01/14 1858 07/05/14 0749   07/01/14 2000  fluconazole (DIFLUCAN) IVPB 400 mg  Status:  Discontinued     400 mg 100 mL/hr over 120 Minutes Intravenous Every 24 hours 07/01/14 1858 07/05/14 0757   07/01/14 1400  fluconazole (DIFLUCAN) IVPB 200 mg  Status:   Discontinued     200 mg 100 mL/hr over 60 Minutes Intravenous Every 24 hours 07/01/14 1330 07/01/14 1853   07/01/14 1000  piperacillin-tazobactam (ZOSYN) IVPB 2.25 g  Status:  Discontinued     2.25 g 100 mL/hr over 30 Minutes Intravenous Every 8 hours 07/01/14 0952 07/01/14 1853   07/01/14 1000  vancomycin (VANCOCIN) 500 mg in sodium chloride 0.9 % 100 mL IVPB     500 mg 100 mL/hr over 60 Minutes Intravenous  Once 07/01/14 0952 07/01/14 1126   06/30/14 1200  vancomycin (VANCOCIN) IVPB 1000 mg/200 mL premix     1,000 mg 200 mL/hr over 60 Minutes Intravenous  Once 06/30/14 0944 06/30/14 1403   06/30/14 1200  ceFEPIme (MAXIPIME) 2 g in dextrose 5 % 50 mL IVPB     2 g 100 mL/hr over 30 Minutes Intravenous  Once 06/30/14 0944 06/30/14 1425   06/30/14 0100  vancomycin (VANCOCIN) 2,000 mg in sodium chloride 0.9 % 500 mL IVPB     2,000 mg 250 mL/hr over 120 Minutes Intravenous  Once 06/30/14 0048 06/30/14 0525   06/30/14 0100  ceFEPIme (MAXIPIME) 2 g in dextrose 5 % 50 mL IVPB     2 g 100 mL/hr over 30 Minutes Intravenous  Once 06/30/14 0048 06/30/14 0322   06/27/14 0600  cefUROXime (ZINACEF) 1.5 g in dextrose 5 % 50 mL IVPB  1.5 g 100 mL/hr over 30 Minutes Intravenous On call to O.R. 06/26/14 1019 06/27/14 0630   06/19/14 1800  oseltamivir (TAMIFLU) capsule 30 mg     30 mg Oral Every M-W-F (1800) 06/19/14 1003 06/22/14 1841   06/18/14 1600  oseltamivir (TAMIFLU) capsule 30 mg  Status:  Discontinued     30 mg Oral Daily 06/18/14 1538 06/19/14 1003       Assessment/Plan POD #17 s/p EXPLORATORY LAPAROTOMY WITH CLOSURE OF PERFORATED PYLORIC ULCER, SMALL BOWEL RESECTION - 07/01/2014 - Dalbert Batman Post-operative ileus Intra-abdominal abscess Cefepime 3/29--->3/30 Zosyn 3/30---> Fluconazole 3/30---> -On soft diet -Open wound, BID wet to dry dressing changes -Needs to mobilize to aid in recovery -Use IS -repeat CT abdomen 4/14--->decreased abscess size. IR did not think drain needs to be  repositioned -right abdomen drain 79m serosanguinous -right buttock drain 151mserous -Cx negative to date Leukocytosis -WBC down 4/15 Anemia - 9.6 - improved ESRD - on HD now PCM/TPN -TPN off.  Tolerating POs.   Stroke - 06/19/2014 C Diff colitis  dCHF DM PNA -Vanc per pharm 3/29 -Left hemiplegia - this will probably limit her mobility and return to normal activities Activity - PT following On Solucortef - Started 4/7, will delay wound healing.     EmErby PianANSt Josephs Area Hlth Servicesurgery Pager [phone] consults and floor pages call [phone](7A-4:30P)  07/18/2014 11:11 AM

## 2014-07-18 NOTE — Progress Notes (Signed)
Progress Note [facility].   [name] ZOX:[id number] DOB: [birth year] DOA: 06/17/2014 PCP: [name], MD  Admit HPI / Brief Narrative: 59 yo female smoker with hx HTN, ESRD on HD (just began this admit), COPD, failed L AV fistula, initially admitted 3/16 with flu and acute hypoxic resp failure. Ultimately tx to [facility] due to need for HD. 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 Made little improvement and on 3/30 had worsening hypoxia, generalized pain, hyperkalemia, worsening leukocytosis and PCCM consulted.   Significant Events: 3/16 influenza A+ 3/18 R ACA stroke 3/19 MRI multifocal acute infarction of both hemispheres, large right distal ACA 3/22 TEE severe RAE, severely reduced RV function, severe TR, no LAA thrombus, small PFO on TEE 3/31 to OR ex lap for per pyloric ulcer and sm bowel resection 4/1 septic shock on pressors 4/3 pressors weaned off  4/4 restart pressors, CRRT 4/6 levo switched to neo 4/7 pelvic abscess on CT 4/8 IR placed pelvic drain 4/9 off pressors 4/11 long run of SVT  HPI/Subjective: She is lying down in bed.  She is complaining of multiple BM.   Assessment/Plan:  C. difficile colitis C diff positive on 4-12.  Oral Flagyl - cont to follow  Still with multiple watery stool. Day 4 flagyl.  Will add Vancomycin, oral.   Acute hypoxic respiratory failure secondary to H1 N1 influenza +/- HCAP Resolved - stable on room air  PSVT Has been evaluated by EP - TSH is normal - Cardiology following - follow K+ and Mg  Continue with metoprolol 12.5 BID.   Small PFO Has been evaluated by Cards  ACA CVA with Lt sided weakness Will need ongoing long term rehab - PT/OT to cont to follow   ESRD new start HD this admit  Ongoing hemodialysis per Nephrology - permcath placed 3/26 d/t AVF problems; when stable [facility] wants to considering revision of AVF Electrolytes abnormalities corrected with dialysis. Hyponatremia, acidosis.    Status post exploratory laparotomy with closure of perforated pyloric ulcer / small bowel resection w/ postop ileus General Surgery continues to follow and is advancing diet - now off TNA and on regular diet  Pelvic abscess status post percutaneous drain placement 4/8 Ongoing care per general surgery and IR On fluconazole, Zosyn. Day 18 zosyn.  Would like input from surgery: does patient  needs fluconazole ? Can we stop Vancomycin?.  Day 18 of vancomycin. Day 17 of fluconazole.  Body fluid grew lactobacillus species.   Chronic diastolic congestive heart failure w/ Severe RHF EF 60-65%, grade 1 diastolic dysfunction. Appears compensated. Fluids manage with dialysis.   Anemia of critical illness and chronic disease Hgb presently stable s/p 2U PRBC 4/13 - follow   Thrombocytopenia Serotonin assay negative - plt count now climbing again - follow trend - no evidence of blood loss at present  Platelet stable.   COPD Stable at present  Diabetes mellitus 2 CBG reasonably controlled - cont to follow  Hypoglycemia this am. Not on long acting insulin.   Obesity - Body mass index is 34.89 kg/(m^2).  Code Status: FULL Family Communication: no family present at time of exam Disposition Plan: Transfer to nephrology floor - continue PT/OT - will need long-term rehabilitation stay in hemodialysis capable nursing home or LTAC   Consultants: Cardiology Nephrology PCCM Gen Surgery   Antibiotics: Zosyn > Oral Flagyl 4/13 >  DVT prophylaxis: SCDs  Objective: Blood pressure 112/72, pulse 97, temperature 98.8 F (37.1 C), temperature  source Oral, resp. rate 18, height 5\' 6"  (1.676 m), weight 98 kg (216 lb 0.8 oz), SpO2 96 %.  Intake/Output Summary (Last 24 hours) at 07/18/14 1442 Last data filed at 07/18/14 1438  Gross per 24 hour  Intake   1010 ml  Output  -1583 ml  Net   2593 ml   Exam: General: No acute respiratory distress - alert and conversant  Lungs: Clear to  auscultation bilaterally without wheezes or crackles Cardiovascular: Regular rate and rhythm without murmur gallop rub Abdomen: Abdominal wound is dressed and dry, abdomen nondistended, , drain intact  Extremities: No significant cyanosis, clubbing, edema bilateral lower extremities  Data Reviewed: Basic Metabolic Panel:  Recent Labs Lab 07/13/14 0440 07/13/14 1600 07/14/14 0455 07/15/14 0440 07/16/14 0440 07/17/14 0410  NA 131* 130* 130* 125* 128* 126*  K 4.0 3.9 3.4* 3.9 3.5 5.3*  CL 100 98 97 93* 96 94*  CO2 26 23 21  18* 21 16*  GLUCOSE 133* 191* 102* 126* 139* 70  BUN 44* 42* 68* 81* 68* 86*  CREATININE 1.81* 1.75* 2.63* 3.60* 3.53* 4.93*  CALCIUM 8.0* 7.9* 7.9* 7.9* 7.7* 7.8*  MG 2.1  --  2.0 1.8 1.7 1.9  PHOS 1.7* 3.2 2.6 3.7 3.9  --     Liver Function Tests:  Recent Labs Lab 07/13/14 0440 07/13/14 1600 07/14/14 0455 07/15/14 0440 07/16/14 0440 07/17/14 0410  AST 60*  --   --   --  21 21  ALT 55*  --   --   --  43* 37*  ALKPHOS 155*  --   --   --  108 119*  BILITOT 1.9*  --   --   --  1.8* 1.7*  PROT 5.8*  --   --   --  5.3* 5.9*  ALBUMIN 1.7* 1.8* 1.6* 1.6* 1.6* 1.6*   CBC:  Recent Labs Lab 07/12/14 0430 07/13/14 0440 07/14/14 0455 07/15/14 0440 07/16/14 0440 07/17/14 0410  WBC 20.6* 46.7* 12.9* 13.4* 12.2* 10.6*  NEUTROABS 18.8* 39.2* 9.7* 10.7*  --   --   HGB 7.0* 7.1* 7.3* 7.1* 8.8* 9.6*  HCT 21.7* 22.8* 23.2* 22.3* 27.1* 29.2*  MCV 84.8 85.1 86.2 87.8 86.3 84.9  PLT 108* 97* 99* 79* 67* 79*    CBG:  Recent Labs Lab 07/17/14 0754 07/17/14 1222 07/17/14 1835 07/18/14 0743 07/18/14 1151  GLUCAP 79 89 79 66* 120*    Recent Results (from the past 240 hour(s))  Culture, routine-abscess     Status: None   Collection Time: 07/10/14  5:15 PM  Result Value Ref Range Status   Specimen Description ABSCESS RIGHT HIP  Final   Special Requests Normal  Final   Gram Stain   Final    FEW WBC PRESENT,BOTH PMN AND MONONUCLEAR NO SQUAMOUS  EPITHELIAL CELLS SEEN NO ORGANISMS SEEN Performed at [facility]    Culture   Final    NO GROWTH 3 DAYS Performed at [facility]    Report Status 07/14/2014 FINAL  Final  Anaerobic culture     Status: None   Collection Time: 07/10/14  5:15 PM  Result Value Ref Range Status   Specimen Description ABSCESS RIGHT HIP  Final   Special Requests Normal  Final   Gram Stain   Final    FEW WBC PRESENT, PREDOMINANTLY PMN NO SQUAMOUS EPITHELIAL CELLS SEEN NO ORGANISMS SEEN Performed at [facility]    Culture   Final    NO ANAEROBES ISOLATED  Performed at [facility]    Report Status 07/15/2014 FINAL  Final  Culture, blood (routine x 2)     Status: None (Preliminary result)   Collection Time: 07/13/14  3:45 PM  Result Value Ref Range Status   Specimen Description BLOOD RIGHT HAND  Final   Special Requests BOTTLES DRAWN AEROBIC ONLY 3CC  Final   Culture   Final           BLOOD CULTURE RECEIVED NO GROWTH TO DATE CULTURE WILL BE HELD FOR 5 DAYS BEFORE ISSUING A FINAL NEGATIVE REPORT Performed at [facility]    Report Status PENDING  Incomplete  Culture, blood (routine x 2)     Status: None (Preliminary result)   Collection Time: 07/13/14  4:00 PM  Result Value Ref Range Status   Specimen Description BLOOD RIGHT HAND  Final   Special Requests BOTTLES DRAWN AEROBIC ONLY 2CC  Final   Culture   Final           BLOOD CULTURE RECEIVED NO GROWTH TO DATE CULTURE WILL BE HELD FOR 5 DAYS BEFORE ISSUING A FINAL NEGATIVE REPORT Note: Culture results may be compromised due to an inadequate volume of blood received in culture bottles. Performed at [facility]    Report Status PENDING  Incomplete  Clostridium Difficile by PCR     Status: Abnormal   Collection Time: 07/14/14 10:15 PM  Result Value Ref Range Status   C difficile by pcr POSITIVE (A) NEGATIVE Final    Comment: CRITICAL RESULT CALLED TO, READ BACK BY AND VERIFIED WITH: Candy Sledge RN  9:45 07/15/14 ([email or web address])      Studies:   Recent x-ray studies have been reviewed in detail by the Attending Physician  Scheduled Meds:  Scheduled Meds: . darbepoetin (ARANESP) injection - DIALYSIS  150 mcg Intravenous Q Sat-HD  . feeding supplement (RESOURCE BREEZE)  1 Container Oral TID BM  . fluconazole (DIFLUCAN) IV  200 mg Intravenous Q24H  . insulin aspart  0-9 Units Subcutaneous TID WC  . ipratropium  0.5 mg Nebulization TID  . levalbuterol  0.63 mg Nebulization TID  . metoprolol tartrate  12.5 mg Oral BID  . metroNIDAZOLE  500 mg Oral 3 times per day  . piperacillin-tazobactam (ZOSYN)  IV  2.25 g Intravenous 3 times per day    Time spent on care of this patient: 35 mins   Alba Cory , MD  [phone] [facility] Office  [phone] Pager - Text Page per Amion as per below:  On-Call/Text Page:      [email or web address]      password [email or web address]  If 7PM-7AM, please contact night-coverage [email or web address] Password [email or web address] 07/18/2014, 2:42 PM   LOS: 31 days

## 2014-07-18 NOTE — Progress Notes (Signed)
  Assessment:  ESRD new start to HD 1. Perforated pyloric ulcer / necrotic small bowel - s/p repair of perf ulcer w partial ileum resection 3/30 2. Recurrent sepsis - improving s/p perc drainage of pelvic fluid collection; on flagyl/ zosyn/ diflucan 3. S/P acute ACA stroke / left hemiparesis 4. Nutrition / ileus - eating liquids 5. Anemia s/p PRBCs 6. R HF - severe by TEE 7. HD access - Permcath placed 3/26 d/t AVF problems; when stable [facility] wants to considering revision of AVF 8. pSVT followed by EPS/Cards  Plan - HD MWF     Subjective: Interval History: -1583 cc off with HD yesterday  Objective: Vital signs in last 24 hours: Temp:  [97.7 F (36.5 C)-99 F (37.2 C)] 98.8 F (37.1 C) (04/16 1009) Pulse Rate:  [88-144] 97 (04/16 1009) Resp:  [16-19] 18 (04/16 1009) BP: (102-119)/(53-90) 112/72 mmHg (04/16 1009) SpO2:  [94 %-98 %] 95 % (04/16 1513) Weight:  [98 kg (216 lb 0.8 oz)] 98 kg (216 lb 0.8 oz) (04/15 2101) Weight change: 0 kg (0 lb)  Intake/Output from previous day: 04/15 0701 - 04/16 0700 In: 630 [P.O.:600; I.V.:30] Out: -1581 [Drains:2] Intake/Output this shift: Total I/O In: 530 [P.O.:480; IV Piggyback:50] Out: -   PE: Alert and appropriate, speech fluent abd bandaged, sofe Ext no edema Flat affect  Lab Results:  Recent Labs  07/16/14 0440 07/17/14 0410  WBC 12.2* 10.6*  HGB 8.8* 9.6*  HCT 27.1* 29.2*  PLT 67* 79*   BMET:  Recent Labs  07/16/14 0440 07/17/14 0410  NA 128* 126*  K 3.5 5.3*  CL 96 94*  CO2 21 16*  GLUCOSE 139* 70  BUN 68* 86*  CREATININE 3.53* 4.93*  CALCIUM 7.7* 7.8*   No results for input(s): PTH in the last 72 hours. Iron Studies: No results for input(s): IRON, TIBC, TRANSFERRIN, FERRITIN in the last 72 hours. Studies/Results: No results found.    LOS: 31 days   [name],Stephanie Clemson C 07/18/2014,4:40 PM

## 2014-07-19 DIAGNOSIS — A0472 Enterocolitis due to Clostridium difficile, not specified as recurrent: Secondary | ICD-10-CM | POA: Insufficient documentation

## 2014-07-19 LAB — CBC
HCT: 27.9 % — ABNORMAL LOW (ref 36.0–46.0)
Hemoglobin: 9.1 g/dL — ABNORMAL LOW (ref 12.0–15.0)
MCH: 28.2 pg (ref 26.0–34.0)
MCHC: 32.6 g/dL (ref 30.0–36.0)
MCV: 86.4 fL (ref 78.0–100.0)
Platelets: 136 10*3/uL — ABNORMAL LOW (ref 150–400)
RBC: 3.23 MIL/uL — ABNORMAL LOW (ref 3.87–5.11)
RDW: 22.3 % — AB (ref 11.5–15.5)
WBC: 10.1 10*3/uL (ref 4.0–10.5)

## 2014-07-19 LAB — BASIC METABOLIC PANEL
Anion gap: 17 — ABNORMAL HIGH (ref 5–15)
BUN: 57 mg/dL — ABNORMAL HIGH (ref 6–23)
CALCIUM: 7.6 mg/dL — AB (ref 8.4–10.5)
CO2: 19 mmol/L (ref 19–32)
Chloride: 95 mmol/L — ABNORMAL LOW (ref 96–112)
Creatinine, Ser: 5.17 mg/dL — ABNORMAL HIGH (ref 0.50–1.10)
GFR calc Af Amer: 10 mL/min — ABNORMAL LOW (ref >90)
GFR calc non Af Amer: 8 mL/min — ABNORMAL LOW (ref >90)
Glucose, Bld: 96 mg/dL (ref 70–99)
Potassium: 3.9 mmol/L (ref 3.5–5.1)
SODIUM: 131 mmol/L — AB (ref 135–145)

## 2014-07-19 LAB — GLUCOSE, CAPILLARY
GLUCOSE-CAPILLARY: 135 mg/dL — AB (ref 70–99)
GLUCOSE-CAPILLARY: 148 mg/dL — AB (ref 70–99)
Glucose-Capillary: 95 mg/dL (ref 70–99)
Glucose-Capillary: 71 mg/dL (ref 70–99)

## 2014-07-19 MED ORDER — IPRATROPIUM BROMIDE 0.02 % IN SOLN
RESPIRATORY_TRACT | Status: AC
  Administered 2014-07-19: 0.5 mg via RESPIRATORY_TRACT
  Filled 2014-07-19: qty 2.5

## 2014-07-19 MED ORDER — DARBEPOETIN ALFA 150 MCG/0.3ML IJ SOSY
150.0000 ug | PREFILLED_SYRINGE | INTRAMUSCULAR | Status: DC
  Administered 2014-07-20 – 2014-07-27 (×2): 150 ug via INTRAVENOUS
  Filled 2014-07-19 (×3): qty 0.3

## 2014-07-19 NOTE — Progress Notes (Signed)
Progress Note [facility].   [name] ZOX:[id number] DOB: [birth year] DOA: 06/17/2014 PCP: [name], MD  Admit HPI / Brief Narrative: 59 yo female smoker with hx HTN, ESRD on HD (just began this admit), COPD, failed L AV fistula, initially admitted 3/16 with flu and acute hypoxic resp failure. Ultimately tx to [facility] due to need for HD. 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 Made little improvement and on 3/30 had worsening hypoxia, generalized pain, hyperkalemia, worsening leukocytosis and PCCM consulted.    HPI/Subjective: She is lying down in bed.  Feeling ok today, no significant abdominal pain.   Assessment/Plan:  C. difficile colitis C diff positive on 4-12.  Received 4 flagyl ni improvement.  Continue with  Vancomycin, oral.   Acute hypoxic respiratory failure secondary to H1 N1 influenza +/- HCAP Resolved - stable on room air  PSVT Has been evaluated by EP - TSH is normal - Cardiology following - follow K+ and Mg  Continue with metoprolol 12.5 BID.   Small PFO Has been evaluated by Cards  ACA CVA with Lt sided weakness Will need ongoing long term rehab - PT/OT to cont to follow   ESRD new start HD this admit  Ongoing hemodialysis per Nephrology - permcath placed 3/26 d/t AVF problems; when stable [facility] wants to considering revision of AVF Electrolytes abnormalities corrected with dialysis. Hyponatremia, acidosis.   Status post exploratory laparotomy with closure of perforated pyloric ulcer / small bowel resection w/ postop ileus General Surgery continues to follow and is advancing diet - now off TNA and on regular diet  Pelvic abscess status post percutaneous drain placement 4/8 Ongoing care per general surgery and IR Day 19 zosyn.  Received  18 days of vancomycin and  18 days  of fluconazole.  Body fluid grew lactobacillus species.  Plan to repeat ct scan next week.   Chronic diastolic congestive heart failure w/ Severe RHF EF  60-65%, grade 1 diastolic dysfunction. Appears compensated. Fluids manage with dialysis.   Anemia of critical illness and chronic disease Hgb presently stable s/p 2U PRBC 4/13 - follow  HB stable.   Thrombocytopenia Serotonin assay negative - plt count now climbing again - follow trend - no evidence of blood loss at present  Platelet increasing.   COPD Stable at present  Diabetes mellitus 2 CBG reasonably controlled - cont to follow  Hypoglycemia this am. Not on long acting insulin.   Obesity - Body mass index is 35.49 kg/(m^2).  Code Status: FULL Family Communication: no family present at time of exam Disposition Plan:  continue PT/OT - will need long-term rehabilitation stay in hemodialysis capable nursing home or LTAC   Consultants: Cardiology Nephrology PCCM Gen Surgery   Significant Events: 3/16 influenza A+ 3/18 R ACA stroke 3/19 MRI multifocal acute infarction of both hemispheres, large right distal ACA 3/22 TEE severe RAE, severely reduced RV function, severe TR, no LAA thrombus, small PFO on TEE 3/31 to OR ex lap for per pyloric ulcer and sm bowel resection 4/1 septic shock on pressors 4/3 pressors weaned off  4/4 restart pressors, CRRT 4/6 levo switched to neo 4/7 pelvic abscess on CT 4/8 IR placed pelvic drain 4/9 off pressors 4/11 long run of SVT.  Antibiotics: Zosyn > Oral Flagyl 4/13 >  DVT prophylaxis: SCDs  Objective: Blood pressure 112/64, pulse 95, temperature 97.9 F (36.6 C), temperature source Oral, resp. rate 20, height  (1.676 m), weight 99.7 kg (219  lb 12.8 oz), SpO2 91 %.  Intake/Output Summary (Last 24 hours) at 07/19/14 1349 Last data filed at 07/19/14 1305  Gross per 24 hour  Intake    630 ml  Output      0 ml  Net    630 ml   Exam: General: No acute respiratory distress - alert and conversant  Lungs: Clear to auscultation bilaterally without wheezes or crackles Cardiovascular: Regular rate and rhythm without murmur  gallop rub Abdomen: Abdominal wound is dressed and dry, abdomen nondistended, , drain intact  Extremities: No significant cyanosis, clubbing, edema bilateral lower extremities  Data Reviewed: Basic Metabolic Panel:  Recent Labs Lab 07/13/14 0440 07/13/14 1600 07/14/14 0455 07/15/14 0440 07/16/14 0440 07/17/14 0410 07/19/14 0535  NA 131* 130* 130* 125* 128* 126* 131*  K 4.0 3.9 3.4* 3.9 3.5 5.3* 3.9  CL 100 98 97 93* 96 94* 95*  CO2 26 23 21  18* 21 16* 19  GLUCOSE 133* 191* 102* 126* 139* 70 96  BUN 44* 42* 68* 81* 68* 86* 57*  CREATININE 1.81* 1.75* 2.63* 3.60* 3.53* 4.93* 5.17*  CALCIUM 8.0* 7.9* 7.9* 7.9* 7.7* 7.8* 7.6*  MG 2.1  --  2.0 1.8 1.7 1.9  --   PHOS 1.7* 3.2 2.6 3.7 3.9  --   --     Liver Function Tests:  Recent Labs Lab 07/13/14 0440 07/13/14 1600 07/14/14 0455 07/15/14 0440 07/16/14 0440 07/17/14 0410  AST 60*  --   --   --  21 21  ALT 55*  --   --   --  43* 37*  ALKPHOS 155*  --   --   --  108 119*  BILITOT 1.9*  --   --   --  1.8* 1.7*  PROT 5.8*  --   --   --  5.3* 5.9*  ALBUMIN 1.7* 1.8* 1.6* 1.6* 1.6* 1.6*   CBC:  Recent Labs Lab 07/13/14 0440 07/14/14 0455 07/15/14 0440 07/16/14 0440 07/17/14 0410 07/19/14 0535  WBC 46.7* 12.9* 13.4* 12.2* 10.6* 10.1  NEUTROABS 39.2* 9.7* 10.7*  --   --   --   HGB 7.1* 7.3* 7.1* 8.8* 9.6* 9.1*  HCT 22.8* 23.2* 22.3* 27.1* 29.2* 27.9*  MCV 85.1 86.2 87.8 86.3 84.9 86.4  PLT 97* 99* 79* 67* 79* 136*    CBG:  Recent Labs Lab 07/18/14 1151 07/18/14 1634 07/18/14 2146 07/19/14 0754 07/19/14 1120  GLUCAP 120* 141* 93 95 135*    Recent Results (from the past 240 hour(s))  Culture, routine-abscess     Status: None   Collection Time: 07/10/14  5:15 PM  Result Value Ref Range Status   Specimen Description ABSCESS RIGHT HIP  Final   Special Requests Normal  Final   Gram Stain   Final    FEW WBC PRESENT,BOTH PMN AND MONONUCLEAR NO SQUAMOUS EPITHELIAL CELLS SEEN NO ORGANISMS SEEN Performed at  [facility]    Culture   Final    NO GROWTH 3 DAYS Performed at [facility]    Report Status 07/14/2014 FINAL  Final  Anaerobic culture     Status: None   Collection Time: 07/10/14  5:15 PM  Result Value Ref Range Status   Specimen Description ABSCESS RIGHT HIP  Final   Special Requests Normal  Final   Gram Stain   Final    FEW WBC PRESENT, PREDOMINANTLY PMN NO SQUAMOUS EPITHELIAL CELLS SEEN NO ORGANISMS SEEN Performed at [facility]  Culture   Final    NO ANAEROBES ISOLATED Performed at [facility]    Report Status 07/15/2014 FINAL  Final  Culture, blood (routine x 2)     Status: None (Preliminary result)   Collection Time: 07/13/14  3:45 PM  Result Value Ref Range Status   Specimen Description BLOOD RIGHT HAND  Final   Special Requests BOTTLES DRAWN AEROBIC ONLY 3CC  Final   Culture   Final           BLOOD CULTURE RECEIVED NO GROWTH TO DATE CULTURE WILL BE HELD FOR 5 DAYS BEFORE ISSUING A FINAL NEGATIVE REPORT Performed at [facility]    Report Status PENDING  Incomplete  Culture, blood (routine x 2)     Status: None (Preliminary result)   Collection Time: 07/13/14  4:00 PM  Result Value Ref Range Status   Specimen Description BLOOD RIGHT HAND  Final   Special Requests BOTTLES DRAWN AEROBIC ONLY 2CC  Final   Culture   Final           BLOOD CULTURE RECEIVED NO GROWTH TO DATE CULTURE WILL BE HELD FOR 5 DAYS BEFORE ISSUING A FINAL NEGATIVE REPORT Note: Culture results may be compromised due to an inadequate volume of blood received in culture bottles. Performed at [facility]    Report Status PENDING  Incomplete  Clostridium Difficile by PCR     Status: Abnormal   Collection Time: 07/14/14 10:15 PM  Result Value Ref Range Status   C difficile by pcr POSITIVE (A) NEGATIVE Final    Comment: CRITICAL RESULT CALLED TO, READ BACK BY AND VERIFIED WITH: Candy Sledge RN 9:45 07/15/14 ([email or web address])      Studies:   Recent  x-ray studies have been reviewed in detail by the Attending Physician  Scheduled Meds:  Scheduled Meds: . [START ON 07/20/2014] darbepoetin (ARANESP) injection - DIALYSIS  150 mcg Intravenous Q Mon-HD  . feeding supplement (RESOURCE BREEZE)  1 Container Oral TID BM  . insulin aspart  0-9 Units Subcutaneous TID WC  . ipratropium  0.5 mg Nebulization TID  . levalbuterol  0.63 mg Nebulization TID  . metoprolol tartrate  12.5 mg Oral BID  . piperacillin-tazobactam (ZOSYN)  IV  2.25 g Intravenous 3 times per day  . sodium chloride  10-40 mL Intracatheter Q12H  . vancomycin  125 mg Oral 4 times per day    Time spent on care of this patient: 35 mins   Alba Cory , MD  [phone] [facility] Office  [phone] Pager - Text Page per Amion as per below:  On-Call/Text Page:      [email or web address]      password [email or web address]  If 7PM-7AM, please contact night-coverage [email or web address] Password [email or web address] 07/19/2014, 1:49 PM   LOS: 32 days

## 2014-07-19 NOTE — Progress Notes (Signed)
     SUBJECTIVE: no chest pain or dyspnea; no palpitations  BP 109/75 mmHg  Pulse 101  Temp(Src) 97.8 F (36.6 C) (Oral)  Resp 20  Ht 5\' 6"  (1.676 m)  Wt 219 lb 12.8 oz (99.7 kg)  BMI 35.49 kg/m2  SpO2 95%  Intake/Output Summary (Last 24 hours) at 07/19/14 0751 Last data filed at 07/18/14 1438  Gross per 24 hour  Intake    530 ml  Output      0 ml  Net    530 ml    PHYSICAL EXAM General: Well developed, chronically ill appearing in no acute distress. Alert and oriented x 3.  HEENT: normal Neck: supple Lungs: CTA anteriorly Heart: RRR  Abdomen: Drains in place; not tender or distended Extremities: 1+ edema on the left.   LABS: Basic Metabolic Panel:  Recent Labs  29/56/2104/15/16 0410  NA 126*  K 5.3*  CL 94*  CO2 16*  GLUCOSE 70  BUN 86*  CREATININE 4.93*  CALCIUM 7.8*  MG 1.9   CBC:  Recent Labs  07/17/14 0410 07/19/14 0535  WBC 10.6* 10.1  HGB 9.6* 9.1*  HCT 29.2* 27.9*  MCV 84.9 86.4  PLT 79* 136*    Current Meds: . darbepoetin (ARANESP) injection - DIALYSIS  150 mcg Intravenous Q Sat-HD  . feeding supplement (RESOURCE BREEZE)  1 Container Oral TID BM  . fluconazole (DIFLUCAN) IV  200 mg Intravenous Q24H  . insulin aspart  0-9 Units Subcutaneous TID WC  . ipratropium  0.5 mg Nebulization TID  . levalbuterol  0.63 mg Nebulization TID  . metoprolol tartrate  12.5 mg Oral BID  . piperacillin-tazobactam (ZOSYN)  IV  2.25 g Intravenous 3 times per day  . sodium chloride  10-40 mL Intracatheter Q12H  . vancomycin  125 mg Oral 4 times per day     ASSESSMENT AND PLAN: 59 year old female with history of SVT, end-stage renal disease on hemodialysis, hypertension, resolved acute hypoxemic respiratory failure secondary to the flu who was noted to have weakness on her left side during hospital stay with CT scan demonstrating acute infarct of right anterior cerebral artery territory. Small PFO noted by transthoracic echo, no DVT/SVT by dopplers 06/18/14 .  TEE  06/23/14: no LAA thrombus, severe RAE; mild RVE; severely reduced RV function; severe TR; bowing of atrial septum right to left; positive saline microcavitation study She was seen by Dr. Anne FuSkains on 06/24/14 and felt to have SVT or atrial tachycardia but no atrial fibrillation. She was seen by Dr. Graciela HusbandsKlein on 06/25/14 and he recommended 30 day event monitor at time of discharge. Since then she has had a prolonged hospitalization complicated by perforated pyloric ulcer. She has had episodes of recurrence of SVT.   1. PSVT: She has been seen during this admission by our EP team. 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 Recent episodes have limited dialysis. TSH normal this admission. Will continue beta blocker for now. SVT most likely partially driven by hyperadrenergic state associated with present illness. Telemetry reviewed; she continues to have SVT; hopefully will improve as present illness improves. Can add amiodarone if needed.  2. ESRD: Per nephrology   Stephanie MillersBrian Naiyah [name]  4/17/20167:51 AM

## 2014-07-19 NOTE — Progress Notes (Signed)
Assessment:  ESRD new start to HD 1. Perforated pyloric ulcer / necrotic small bowel - s/p repair of perf ulcer w partial ileum resection 3/30 2. Recurrent sepsis - improving s/p perc drainage of pelvic fluid collection; on flagyl/ zosyn 3. S/P acute ACA stroke / left hemiparesis 4. Nutrition / ileus - diet progressing 5. Anemia s/p PRBCs 6. RHF - severe by TEE 7. HD access - Permcath placed 3/26 d/t AVF problems; when stable VVS wants to considering revision of AVF 8. pSVT followed by EPS/Cards  Plan - HD MWF, next tx in AM  Subjective: Interval History: Eating some  Objective: Vital signs in last 24 hours: Temp:  [97.8 F (36.6 [name])-97.9 F (36.6 [name])] 97.9 F (36.6 [name]) (04/17 0838) Pulse Rate:  [95-104] 95 (04/17 0838) Resp:  [18-20] 20 (04/17 0838) BP: (109-112)/(64-75) 112/64 mmHg (04/17 0838) SpO2:  [89 %-95 %] 91 % (04/17 0838) Weight:  [99.7 kg (219 lb 12.8 oz)] 99.7 kg (219 lb 12.8 oz) (04/17 0643) Weight change: -0.9 kg (-1 lb 15.7 oz)  Intake/Output from previous day: 04/16 0701 - 04/17 0700 In: 530 [P.O.:480; IV Piggyback:50] Out: -  Intake/Output this shift: Total I/O In: 390 [P.O.:340; IV Piggyback:50] Out: -   General appearance: alert, cooperative and appears older than stated age GI: post op bandaged Extremities: LUE AVF with thrill, LE edema 1+   Lab Results:  Recent Labs  07/17/14 0410 07/19/14 0535  WBC 10.6* 10.1  HGB 9.6* 9.1*  HCT 29.2* 27.9*  PLT 79* 136*   BMET:  Recent Labs  07/17/14 0410 07/19/14 0535  NA 126* 131*  K 5.3* 3.9  CL 94* 95*  CO2 16* 19  GLUCOSE 70 96  BUN 86* 57*  CREATININE 4.93* 5.17*  CALCIUM 7.8* 7.6*   No results for input(s): PTH in the last 72 hours. Iron Studies: No results for input(s): IRON, TIBC, TRANSFERRIN, FERRITIN in the last 72 hours. Studies/Results: No results found.  Scheduled: . [START ON 07/20/2014] darbepoetin (ARANESP) injection - DIALYSIS  150 mcg Intravenous Q Mon-HD  .  feeding supplement (RESOURCE BREEZE)  1 Container Oral TID BM  . insulin aspart  0-9 Units Subcutaneous TID WC  . ipratropium  0.5 mg Nebulization TID  . levalbuterol  0.63 mg Nebulization TID  . metoprolol tartrate  12.5 mg Oral BID  . piperacillin-tazobactam (ZOSYN)  IV  2.25 g Intravenous 3 times per day  . sodium chloride  10-40 mL Intracatheter Q12H  . vancomycin  125 mg Oral 4 times per day     LOS: 32 days   [name],[name] 07/19/2014,2:51 PM

## 2014-07-19 NOTE — Progress Notes (Signed)
[name], female   DOB: 09/26/1955, 59 y.o.   MRN: [id number]     [facility]      [address] [place], [place] [address]    Phone: [phone] FAX: [phone]     Subjective: Now with purulent output from buttock drain.  tolerating POs.  Having BMs..  Afebrile.  WBC normal.  Objective:  Vital signs:  Filed Vitals:   07/18/14 2048 07/19/14 0643 07/19/14 0801 07/19/14 0900  BP:  109/75  104/70  Pulse: 104 101  97  Temp:  97.8 F (36.6 C)  98 F (36.7 C)  TempSrc:  Oral  Oral  Resp: _0 Height:      Weight:  99.7 kg (219 lb 12.8 oz)    SpO2: 94% 95% 89% 92%    Last BM Date: 07/18/14  Intake/Output   Yesterday:  04/16 0701 - 04/17 0700 In: 530 [P.O.:480; IV Piggyback:50] Out: -  This shift: I/O last 3 completed shifts: In: 1010 [P.O.:960; IV Piggyback:50] Out: -     Physical Exam: General: Pt awake/alert/oriented x4 in no acute distress Abdomen: Soft. Nondistended. Midline wound-wound is beefy red, fascia is intact Right abd drain with serosanguinous output. Right buttock drainage with purulent output. No evidence of peritonitis. No incarcerated hernias.    Problem List:   Principal Problem:   Acute respiratory failure with hypoxia Active Problems:   Cerebral thrombosis with cerebral infarction   Hypertension   ESRD needing dialysis   Obesity (BMI 30-39.9)   Depression   Left renal mass   Chronic diastolic heart failure   [id number] influenza   Tobacco use disorder   Renal failure (ARF), acute on chronic   Hyperlipidemia   PSVT (paroxysmal supraventricular tachycardia)   Hypoxia   SOB (shortness of breath)   Perforated gastric ulcer   Pelvic fluid collection   Abdominal abscess    Results:   Labs: Results for orders placed or performed during the hospital encounter of 06/17/14 (from the past 48 hour(s))  Glucose, capillary     Status: None   Collection Time: 07/17/14  12:22 PM  Result Value Ref Range   Glucose-Capillary 89 70 - 99 mg/dL   Comment 1 Capillary Specimen   Glucose, capillary     Status: None   Collection Time: 07/17/14  6:35 PM  Result Value Ref Range   Glucose-Capillary 79 70 - 99 mg/dL  Glucose, capillary     Status: Abnormal   Collection Time: 07/18/14  7:43 AM  Result Value Ref Range   Glucose-Capillary 66 (L) 70 - 99 mg/dL   Comment 1 Notify RN    Comment 2 Document in Chart   Glucose, capillary     Status: Abnormal   Collection Time: 07/18/14 11:51 AM  Result Value Ref Range   Glucose-Capillary 120 (H) 70 - 99 mg/dL   Comment 1 Notify RN    Comment 2 Document in Chart   Glucose, capillary     Status: Abnormal   Collection Time: 07/18/14  4:34 PM  Result Value Ref Range   Glucose-Capillary 141 (H) 70 - 99 mg/dL   Comment 1 Notify RN    Comment 2 Document in Chart   Glucose, capillary     Status: None   Collection Time: 07/18/14  9:46 PM  Result Value Ref Range   Glucose-Capillary 93 70 - 99 mg/dL  CBC     Status: Abnormal  Collection Time: 07/19/14  5:35 AM  Result Value Ref Range   WBC 10.1 4.0 - 10.5 K/uL   RBC 3.23 (L) 3.87 - 5.11 MIL/uL   Hemoglobin 9.1 (L) 12.0 - 15.0 g/dL   HCT 27.9 (L) 36.0 - 46.0 %   MCV 86.4 78.0 - 100.0 fL   MCH 28.2 26.0 - 34.0 pg   MCHC 32.6 30.0 - 36.0 g/dL   RDW 22.3 (H) 11.5 - 15.5 %   Platelets 136 (L) 150 - 400 K/uL  Basic metabolic panel     Status: Abnormal   Collection Time: 07/19/14  5:35 AM  Result Value Ref Range   Sodium 131 (L) 135 - 145 mmol/L   Potassium 3.9 3.5 - 5.1 mmol/L   Chloride 95 (L) 96 - 112 mmol/L   CO2 19 19 - 32 mmol/L   Glucose, Bld 96 70 - 99 mg/dL   BUN 57 (H) 6 - 23 mg/dL   Creatinine, Ser 5.17 (H) 0.50 - 1.10 mg/dL   Calcium 7.6 (L) 8.4 - 10.5 mg/dL   GFR calc non Af Amer 8 (L) >90 mL/min   GFR calc Af Amer 10 (L) >90 mL/min    Comment: (NOTE) The eGFR has been calculated using the CKD EPI equation. This calculation has not been validated in  all clinical situations. eGFR's persistently <90 mL/min signify possible Chronic Kidney Disease.    Anion gap 17 (H) 5 - 15  Glucose, capillary     Status: None   Collection Time: 07/19/14  7:54 AM  Result Value Ref Range   Glucose-Capillary 95 70 - 99 mg/dL   Comment 1 Notify RN    Comment 2 Document in Chart     Imaging / Studies: No results found.  Medications / Allergies:  Scheduled Meds: . darbepoetin (ARANESP) injection - DIALYSIS  150 mcg Intravenous Q Sat-HD  . feeding supplement (RESOURCE BREEZE)  1 Container Oral TID BM  . insulin aspart  0-9 Units Subcutaneous TID WC  . ipratropium  0.5 mg Nebulization TID  . levalbuterol  0.63 mg Nebulization TID  . metoprolol tartrate  12.5 mg Oral BID  . piperacillin-tazobactam (ZOSYN)  IV  2.25 g Intravenous 3 times per day  . sodium chloride  10-40 mL Intracatheter Q12H  . vancomycin  125 mg Oral 4 times per day   Continuous Infusions:  PRN Meds:.sodium chloride, sodium chloride, sodium chloride, sodium chloride, anticoagulant sodium citrate, bisacodyl, feeding supplement (NEPRO CARB STEADY), fentaNYL (SUBLIMAZE) injection, HYDROcodone-acetaminophen, lidocaine (PF), lidocaine-prilocaine, metoprolol, morphine injection, ondansetron (ZOFRAN) IV, pentafluoroprop-tetrafluoroeth, sodium chloride  Antibiotics: Anti-infectives    Start     Dose/Rate Route Frequency Ordered Stop   07/18/14 1800  vancomycin (VANCOCIN) 50 mg/mL oral solution 125 mg     125 mg Oral 4 times per day 07/18/14 1455     07/17/14 1830  vancomycin (VANCOCIN) IVPB 750 mg/150 ml premix     750 mg 150 mL/hr over 60 Minutes Intravenous  Once 07/17/14 1818 07/18/14 0024   07/15/14 1400  metroNIDAZOLE (FLAGYL) tablet 500 mg  Status:  Discontinued     500 mg Oral 3 times per day 07/15/14 1202 07/18/14 1533   07/13/14 2200  piperacillin-tazobactam (ZOSYN) IVPB 2.25 g     2.25 g 100 mL/hr over 30 Minutes Intravenous 3 times per day 07/13/14 1318     07/13/14 2000   fluconazole (DIFLUCAN) IVPB 200 mg  Status:  Discontinued     200 mg 100 mL/hr over 60 Minutes  Intravenous Every 24 hours 07/13/14 1318 07/19/14 1027   07/07/14 1200  vancomycin (VANCOCIN) IVPB 1000 mg/200 mL premix  Status:  Discontinued     1,000 mg 200 mL/hr over 60 Minutes Intravenous Every 24 hours 07/07/14 0937 07/14/14 1712   07/06/14 2000  fluconazole (DIFLUCAN) IVPB 400 mg  Status:  Discontinued     400 mg 100 mL/hr over 120 Minutes Intravenous Every 24 hours 07/06/14 1507 07/13/14 1318   07/06/14 1800  piperacillin-tazobactam (ZOSYN) IVPB 2.25 g  Status:  Discontinued     2.25 g 100 mL/hr over 30 Minutes Intravenous 4 times per day 07/06/14 1505 07/13/14 1318   07/05/14 2000  fluconazole (DIFLUCAN) IVPB 200 mg  Status:  Discontinued     200 mg 100 mL/hr over 60 Minutes Intravenous Every 24 hours 07/05/14 0757 07/06/14 1507   07/05/14 1400  piperacillin-tazobactam (ZOSYN) IVPB 2.25 g  Status:  Discontinued     2.25 g 100 mL/hr over 30 Minutes Intravenous 3 times per day 07/05/14 0749 07/06/14 1505   07/02/14 1800  vancomycin (VANCOCIN) IVPB 1000 mg/200 mL premix  Status:  Discontinued     1,000 mg 200 mL/hr over 60 Minutes Intravenous Every 24 hours 07/01/14 1858 07/05/14 0801   07/01/14 2200  piperacillin-tazobactam (ZOSYN) IVPB 3.375 g  Status:  Discontinued     3.375 g 100 mL/hr over 30 Minutes Intravenous 4 times per day 07/01/14 1858 07/05/14 0749   07/01/14 2000  fluconazole (DIFLUCAN) IVPB 400 mg  Status:  Discontinued     400 mg 100 mL/hr over 120 Minutes Intravenous Every 24 hours 07/01/14 1858 07/05/14 0757   07/01/14 1400  fluconazole (DIFLUCAN) IVPB 200 mg  Status:  Discontinued     200 mg 100 mL/hr over 60 Minutes Intravenous Every 24 hours 07/01/14 1330 07/01/14 1853   07/01/14 1000  piperacillin-tazobactam (ZOSYN) IVPB 2.25 g  Status:  Discontinued     2.25 g 100 mL/hr over 30 Minutes Intravenous Every 8 hours 07/01/14 0952 07/01/14 1853   07/01/14 1000   vancomycin (VANCOCIN) 500 mg in sodium chloride 0.9 % 100 mL IVPB     500 mg 100 mL/hr over 60 Minutes Intravenous  Once 07/01/14 0952 07/01/14 1126   06/30/14 1200  vancomycin (VANCOCIN) IVPB 1000 mg/200 mL premix     1,000 mg 200 mL/hr over 60 Minutes Intravenous  Once 06/30/14 0944 06/30/14 1403   06/30/14 1200  ceFEPIme (MAXIPIME) 2 g in dextrose 5 % 50 mL IVPB     2 g 100 mL/hr over 30 Minutes Intravenous  Once 06/30/14 0944 06/30/14 1425   06/30/14 0100  vancomycin (VANCOCIN) 2,000 mg in sodium chloride 0.9 % 500 mL IVPB     2,000 mg 250 mL/hr over 120 Minutes Intravenous  Once 06/30/14 0048 06/30/14 0525   06/30/14 0100  ceFEPIme (MAXIPIME) 2 g in dextrose 5 % 50 mL IVPB     2 g 100 mL/hr over 30 Minutes Intravenous  Once 06/30/14 0048 06/30/14 0322   06/27/14 0600  cefUROXime (ZINACEF) 1.5 g in dextrose 5 % 50 mL IVPB     1.5 g 100 mL/hr over 30 Minutes Intravenous On call to O.R. 06/26/14 1019 06/27/14 0630   06/19/14 1800  oseltamivir (TAMIFLU) capsule 30 mg     30 mg Oral Every M-W-F (1800) 06/19/14 1003 06/22/14 1841   06/18/14 1600  oseltamivir (TAMIFLU) capsule 30 mg  Status:  Discontinued     30 mg Oral Daily 06/18/14 1538 06/19/14 1003  Assessment/Plan POD #18 s/p EXPLORATORY LAPAROTOMY WITH CLOSURE OF PERFORATED PYLORIC ULCER, SMALL BOWEL RESECTION - 07/01/2014 - Dalbert Batman Post-operative ileus Intra-abdominal abscess Cefepime 3/29--->3/30 Zosyn 3/30---> Fluconazole 3/30--->4/17 -On soft diet -Open wound, BID wet to dry dressing changes -Needs to mobilize to aid in recovery -Use IS -repeat CT abdomen 4/14--->decreased abscess size. IR did not think drain needs to be repositioned -right abdomen drain minimal serosanguinous -right buttock drain, more purulent output today, about 45m -Cx negative to date Leukocytosis -WBC normal 4/17 Anemia - 9.6 - improved ESRD - on HD now PCM/TPN -TPN off. Tolerating POs.  Stroke - 06/19/2014 C Diff colitis   dCHF DM PNA -Vanc per pharm 3/29--does not need this for intra-abdominal abscess -Left hemiplegia - this will probably limit her mobility and return to normal activities Activity - PT following On Solucortef - Started 4/7, will delay wound healing.     EErby Pian AMay[facility] HospitalSurgery Pager [phone] consults and floor pages call [phone] 07/19/2014 10:57 AM

## 2014-07-19 NOTE — Progress Notes (Signed)
sats 89 so placed on 2L [place]

## 2014-07-20 DIAGNOSIS — N186 End stage renal disease: Secondary | ICD-10-CM

## 2014-07-20 LAB — CBC
HEMATOCRIT: 28.7 % — AB (ref 36.0–46.0)
HEMOGLOBIN: 9.2 g/dL — AB (ref 12.0–15.0)
MCH: 28 pg (ref 26.0–34.0)
MCHC: 32.1 g/dL (ref 30.0–36.0)
MCV: 87.2 fL (ref 78.0–100.0)
Platelets: 196 10*3/uL (ref 150–400)
RBC: 3.29 MIL/uL — AB (ref 3.87–5.11)
RDW: 22 % — ABNORMAL HIGH (ref 11.5–15.5)
WBC: 10 10*3/uL (ref 4.0–10.5)

## 2014-07-20 LAB — CULTURE, BLOOD (ROUTINE X 2)
CULTURE: NO GROWTH
CULTURE: NO GROWTH

## 2014-07-20 LAB — RENAL FUNCTION PANEL
ALBUMIN: 1.6 g/dL — AB (ref 3.5–5.2)
ANION GAP: 18 — AB (ref 5–15)
BUN: 65 mg/dL — AB (ref 6–23)
CALCIUM: 7.9 mg/dL — AB (ref 8.4–10.5)
CO2: 20 mmol/L (ref 19–32)
Chloride: 96 mmol/L (ref 96–112)
Creatinine, Ser: 6.48 mg/dL — ABNORMAL HIGH (ref 0.50–1.10)
GFR calc Af Amer: 7 mL/min — ABNORMAL LOW (ref >90)
GFR calc non Af Amer: 6 mL/min — ABNORMAL LOW (ref >90)
Glucose, Bld: 116 mg/dL — ABNORMAL HIGH (ref 70–99)
PHOSPHORUS: 7.9 mg/dL — AB (ref 2.3–4.6)
POTASSIUM: 4.1 mmol/L (ref 3.5–5.1)
Sodium: 134 mmol/L — ABNORMAL LOW (ref 135–145)

## 2014-07-20 LAB — GLUCOSE, CAPILLARY
GLUCOSE-CAPILLARY: 59 mg/dL — AB (ref 70–99)
GLUCOSE-CAPILLARY: 74 mg/dL (ref 70–99)
Glucose-Capillary: 53 mg/dL — ABNORMAL LOW (ref 70–99)
Glucose-Capillary: 72 mg/dL (ref 70–99)
Glucose-Capillary: 98 mg/dL (ref 70–99)

## 2014-07-20 MED ORDER — BOOST / RESOURCE BREEZE PO LIQD
1.0000 | Freq: Every day | ORAL | Status: DC
  Administered 2014-07-22 – 2014-07-28 (×2): 1 via ORAL

## 2014-07-20 MED ORDER — METOPROLOL TARTRATE 25 MG PO TABS
25.0000 mg | ORAL_TABLET | Freq: Two times a day (BID) | ORAL | Status: DC
  Administered 2014-07-21 – 2014-08-02 (×20): 25 mg via ORAL
  Filled 2014-07-20 (×30): qty 1

## 2014-07-20 MED ORDER — LIDOCAINE-PRILOCAINE 2.5-2.5 % EX CREA: 1.0000 "application " | TOPICAL_CREAM | CUTANEOUS | Status: DC | PRN

## 2014-07-20 MED ORDER — SODIUM CHLORIDE 0.9 % IV SOLN: 100.0000 mL | INTRAVENOUS | Status: DC | PRN

## 2014-07-20 MED ORDER — PENTAFLUOROPROP-TETRAFLUOROETH EX AERO: 1.0000 "application " | INHALATION_SPRAY | CUTANEOUS | Status: DC | PRN

## 2014-07-20 MED ORDER — NEPRO/CARBSTEADY PO LIQD
237.0000 mL | Freq: Two times a day (BID) | ORAL | Status: DC
  Administered 2014-07-21 – 2014-07-28 (×4): 237 mL via ORAL

## 2014-07-20 MED ORDER — MIDODRINE HCL 5 MG PO TABS
10.0000 mg | ORAL_TABLET | Freq: Two times a day (BID) | ORAL | Status: DC
  Administered 2014-07-20 – 2014-08-03 (×28): 10 mg via ORAL
  Filled 2014-07-20 (×30): qty 2

## 2014-07-20 MED ORDER — METOPROLOL TARTRATE 12.5 MG HALF TABLET
12.5000 mg | ORAL_TABLET | Freq: Once | ORAL | Status: AC
  Administered 2014-07-20: 12.5 mg via ORAL
  Filled 2014-07-20: qty 1

## 2014-07-20 MED ORDER — HEPARIN SODIUM (PORCINE) 1000 UNIT/ML DIALYSIS
20.0000 [IU]/kg | INTRAMUSCULAR | Status: DC | PRN
  Filled 2014-07-20: qty 2

## 2014-07-20 MED ORDER — METOPROLOL TARTRATE 25 MG PO TABS
25.0000 mg | ORAL_TABLET | Freq: Once | ORAL | Status: AC
  Administered 2014-07-20: 25 mg via ORAL
  Filled 2014-07-20: qty 1

## 2014-07-20 MED ORDER — DARBEPOETIN ALFA 150 MCG/0.3ML IJ SOSY
PREFILLED_SYRINGE | INTRAMUSCULAR | Status: AC
  Administered 2014-07-20: 150 ug via INTRAVENOUS
  Filled 2014-07-20: qty 0.3

## 2014-07-20 MED ORDER — LIDOCAINE HCL (PF) 1 % IJ SOLN: 5.0000 mL | INTRAMUSCULAR | Status: DC | PRN

## 2014-07-20 MED ORDER — HEPARIN SODIUM (PORCINE) 1000 UNIT/ML DIALYSIS
1000.0000 [IU] | INTRAMUSCULAR | Status: DC | PRN
  Filled 2014-07-20: qty 1

## 2014-07-20 MED ORDER — DEXTROSE 50 % IV SOLN
INTRAVENOUS | Status: AC
  Administered 2014-07-20: 25 mL
  Filled 2014-07-20: qty 50

## 2014-07-20 MED ORDER — ZOLPIDEM TARTRATE 5 MG PO TABS
5.0000 mg | ORAL_TABLET | Freq: Once | ORAL | Status: AC
  Administered 2014-07-20: 5 mg via ORAL
  Filled 2014-07-20: qty 1

## 2014-07-20 MED ORDER — MIDODRINE HCL 5 MG PO TABS
ORAL_TABLET | ORAL | Status: AC
  Administered 2014-07-20: 10 mg via ORAL
  Filled 2014-07-20: qty 2

## 2014-07-20 MED ORDER — NEPRO/CARBSTEADY PO LIQD: 237.0000 mL | ORAL | Status: DC | PRN

## 2014-07-20 MED ORDER — METOPROLOL TARTRATE 1 MG/ML IV SOLN
INTRAVENOUS | Status: AC
  Administered 2014-07-20: 5 mg via INTRAVENOUS
  Filled 2014-07-20: qty 5

## 2014-07-20 MED ORDER — ALTEPLASE 2 MG IJ SOLR
2.0000 mg | Freq: Once | INTRAMUSCULAR | Status: AC | PRN
  Filled 2014-07-20: qty 2

## 2014-07-20 NOTE — Progress Notes (Addendum)
SUBJECTIVE: no chest pain or dyspnea; no palpitations  BP 112/87 mmHg  Pulse 95  Temp(Src) 97.6 F (36.4 C) (Oral)  Resp 16  Ht  (1.676 m)  Wt 219 lb 12.8 oz (99.7 kg)  BMI 35.49 kg/m2  SpO2 95%  Intake/Output Summary (Last 24 hours) at 07/20/14 1010 Last data filed at 07/20/14 0648  Gross per 24 hour  Intake    670 ml  Output      3 ml  Net    667 ml    PHYSICAL EXAM General: Well developed, chronically ill appearing in no acute distress. Alert and oriented x 3.  HEENT: normal Neck: supple Lungs: decreased breath sounds Heart: RRR 2/6 systolic murmur LSB Abdomen: Drains in place; not tender or distended Extremities: 1+ edema on the left.   LABS: Basic Metabolic Panel:  Recent Labs  16/10/96 0535  NA 131*  K 3.9  CL 95*  CO2 19  GLUCOSE 96  BUN 57*  CREATININE 5.17*  CALCIUM 7.6*   CBC:  Recent Labs  07/19/14 0535  WBC 10.1  HGB 9.1*  HCT 27.9*  MCV 86.4  PLT 136*    Current Meds: . darbepoetin (ARANESP) injection - DIALYSIS  150 mcg Intravenous Q Mon-HD  . feeding supplement (RESOURCE BREEZE)  1 Container Oral TID BM  . insulin aspart  0-9 Units Subcutaneous TID WC  . ipratropium  0.5 mg Nebulization TID  . levalbuterol  0.63 mg Nebulization TID  . metoprolol tartrate  12.5 mg Oral BID  . piperacillin-tazobactam (ZOSYN)  IV  2.25 g Intravenous 3 times per day  . sodium chloride  10-40 mL Intracatheter Q12H  . vancomycin  125 mg Oral 4 times per day  . zolpidem  5 mg Oral Once     ASSESSMENT AND PLAN: 59 year old female with history of SVT, end-stage renal disease on hemodialysis, hypertension, resolved acute hypoxemic respiratory failure secondary to the flu who was noted to have weakness on her left side during hospital stay with CT scan demonstrating acute infarct of right anterior cerebral artery territory. Small PFO noted by transthoracic echo, no DVT/SVT by dopplers 06/18/14 .  TEE 06/23/14: no LAA thrombus, severe RAE; mild RVE;  severely reduced RV function; severe TR; bowing of atrial septum right to left; positive saline microcavitation study She was seen by Dr. Anne Fu on 06/24/14 and felt to have SVT or atrial tachycardia but no atrial fibrillation. She was seen by Dr. Graciela Husbands on 06/25/14 and he recommended 30 day event monitor at time of discharge. Since then she has had a prolonged hospitalization complicated by perforated pyloric ulcer. She has had episodes of recurrence of SVT.   1. PSVT: She has been seen during this admission by our EP team. 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 Recent episodes have limited dialysis. TSH normal this admission. Will continue beta blocker for now. SVT most likely partially driven by hyperadrenergic state associated with present illness. Telemetry reviewed; she continues to have SVT; hopefully will improve as present illness improves. Can add amiodarone if needed.  2. ESRD: Per nephrology    Plan: Recurrent SVT 3-5 am today. Now in NSR. B/P is stable.   ? Increase Lopressor to 25 mg BID vs add Amiodarone- will give extra Lopressor this am x one dose and discuss further Rx with MD.   Abelino Derrick PA  4/18/201610:10 AM   Patient seen and examined. Agree with assessment and plan. No current  chest pain. Currently in dialysis. Initially HR 100 sinus rhthm then developed in HR to 144 c/w SVT. Will try to increase lopressor to 25 mg bid initially as BP allows.   Lennette Biharihomas A. Kelly, MD, [facility] HospitalFACC 07/20/2014 2:28 PM

## 2014-07-20 NOTE — Progress Notes (Signed)
[name], female   DOB: 10-20-1955, 59 y.o.   MRN: [id number]     [facility]      [address] [place], [place] [address]    Phone: [phone] FAX: [phone]     Subjective: Little output from drains, but none recorded.  Appetite is okay.  Diarrhea has improved.  Afebrile.   Objective:  Vital signs:  Filed Vitals:   07/19/14 0838 07/19/14 1455 07/19/14 1900 07/20/14 0509  BP: 112/64  118/79 112/87  Pulse: 95  96 95  Temp: 97.9 F (36.6 C)  97.7 F (36.5 C) 97.6 F (36.4 C)  TempSrc: Oral  Oral Oral  Resp: _0 Height:      Weight:      SpO2: 91% 95% 98% 94%    Last BM Date: 07/19/14  Intake/Output   Yesterday:  04/17 0701 - 04/18 0700 In: 770 [P.O.:700; I.V.:20; IV Piggyback:50] Out: 3 [Stool:3] This shift:    I/O last 3 completed shifts: In: 67 [P.O.:700; I.V.:20; IV Piggyback:50] Out: 3 [Stool:3]     Physical Exam: General: Pt awake/alert/oriented x4 in no acute distress Abdomen: Soft. Nondistended. Midline wound--dry and intact.  Right abd drain with serosanguinous output. Right buttock drainage with purulent output. No evidence of peritonitis. No incarcerated hernias.  Problem List:   Principal Problem:   Acute respiratory failure with hypoxia Active Problems:   Cerebral thrombosis with cerebral infarction   Hypertension   ESRD needing dialysis   Obesity (BMI 30-39.9)   Depression   Left renal mass   Chronic diastolic heart failure   C6C3 influenza   Tobacco use disorder   Renal failure (ARF), acute on chronic   Hyperlipidemia   PSVT (paroxysmal supraventricular tachycardia)   Hypoxia   SOB (shortness of breath)   Perforated gastric ulcer   Pelvic fluid collection   Abdominal abscess   Enteritis due to Clostridium difficile    Results:   Labs: Results for orders placed or performed during the hospital encounter of 06/17/14 (from the past 48 hour(s))   Glucose, capillary     Status: Abnormal   Collection Time: 07/18/14 11:51 AM  Result Value Ref Range   Glucose-Capillary 120 (H) 70 - 99 mg/dL   Comment 1 Notify RN    Comment 2 Document in Chart   Glucose, capillary     Status: Abnormal   Collection Time: 07/18/14  4:34 PM  Result Value Ref Range   Glucose-Capillary 141 (H) 70 - 99 mg/dL   Comment 1 Notify RN    Comment 2 Document in Chart   Glucose, capillary     Status: None   Collection Time: 07/18/14  9:46 PM  Result Value Ref Range   Glucose-Capillary 93 70 - 99 mg/dL  CBC     Status: Abnormal   Collection Time: 07/19/14  5:35 AM  Result Value Ref Range   WBC 10.1 4.0 - 10.5 K/uL   RBC 3.23 (L) 3.87 - 5.11 MIL/uL   Hemoglobin 9.1 (L) 12.0 - 15.0 g/dL   HCT 27.9 (L) 36.0 - 46.0 %   MCV 86.4 78.0 - 100.0 fL   MCH 28.2 26.0 - 34.0 pg   MCHC 32.6 30.0 - 36.0 g/dL   RDW 22.3 (H) 11.5 - 15.5 %   Platelets 136 (L) 150 - 400 K/uL  Basic metabolic panel     Status: Abnormal   Collection Time: 07/19/14  5:35 AM  Result Value Ref Range   Sodium 131 (L) 135 - 145 mmol/L   Potassium 3.9 3.5 - 5.1 mmol/L   Chloride 95 (L) 96 - 112 mmol/L   CO2 19 19 - 32 mmol/L   Glucose, Bld 96 70 - 99 mg/dL   BUN 57 (H) 6 - 23 mg/dL   Creatinine, Ser 5.17 (H) 0.50 - 1.10 mg/dL   Calcium 7.6 (L) 8.4 - 10.5 mg/dL   GFR calc non Af Amer 8 (L) >90 mL/min   GFR calc Af Amer 10 (L) >90 mL/min    Comment: (NOTE) The eGFR has been calculated using the CKD EPI equation. This calculation has not been validated in all clinical situations. eGFR's persistently <90 mL/min signify possible Chronic Kidney Disease.    Anion gap 17 (H) 5 - 15  Glucose, capillary     Status: None   Collection Time: 07/19/14  7:54 AM  Result Value Ref Range   Glucose-Capillary 95 70 - 99 mg/dL   Comment 1 Notify RN    Comment 2 Document in Chart   Glucose, capillary     Status: Abnormal   Collection Time: 07/19/14 11:20 AM  Result Value Ref Range   Glucose-Capillary  135 (H) 70 - 99 mg/dL   Comment 1 Notify RN    Comment 2 Document in Chart   Glucose, capillary     Status: Abnormal   Collection Time: 07/19/14  4:48 PM  Result Value Ref Range   Glucose-Capillary 148 (H) 70 - 99 mg/dL   Comment 1 Notify RN    Comment 2 Document in Chart   Glucose, capillary     Status: None   Collection Time: 07/19/14  9:50 PM  Result Value Ref Range   Glucose-Capillary 71 70 - 99 mg/dL  Glucose, capillary     Status: Abnormal   Collection Time: 07/20/14  4:54 AM  Result Value Ref Range   Glucose-Capillary 59 (L) 70 - 99 mg/dL  Glucose, capillary     Status: Abnormal   Collection Time: 07/20/14  7:38 AM  Result Value Ref Range   Glucose-Capillary 53 (L) 70 - 99 mg/dL  Glucose, capillary     Status: None   Collection Time: 07/20/14  8:52 AM  Result Value Ref Range   Glucose-Capillary 72 70 - 99 mg/dL    Imaging / Studies: No results found.  Medications / Allergies:  Scheduled Meds: . darbepoetin (ARANESP) injection - DIALYSIS  150 mcg Intravenous Q Mon-HD  . feeding supplement (RESOURCE BREEZE)  1 Container Oral TID BM  . insulin aspart  0-9 Units Subcutaneous TID WC  . ipratropium  0.5 mg Nebulization TID  . levalbuterol  0.63 mg Nebulization TID  . metoprolol tartrate  12.5 mg Oral BID  . piperacillin-tazobactam (ZOSYN)  IV  2.25 g Intravenous 3 times per day  . sodium chloride  10-40 mL Intracatheter Q12H  . vancomycin  125 mg Oral 4 times per day  . zolpidem  5 mg Oral Once   Continuous Infusions:  PRN Meds:.sodium chloride, sodium chloride, sodium chloride, sodium chloride, anticoagulant sodium citrate, bisacodyl, feeding supplement (NEPRO CARB STEADY), fentaNYL (SUBLIMAZE) injection, HYDROcodone-acetaminophen, lidocaine (PF), lidocaine-prilocaine, metoprolol, morphine injection, ondansetron (ZOFRAN) IV, pentafluoroprop-tetrafluoroeth, sodium chloride  Antibiotics: Anti-infectives    Start     Dose/Rate Route Frequency Ordered Stop   07/18/14  1800  vancomycin (VANCOCIN) 50 mg/mL oral solution 125 mg     125 mg Oral 4 times per day 07/18/14 1455  07/17/14 1830  vancomycin (VANCOCIN) IVPB 750 mg/150 ml premix     750 mg 150 mL/hr over 60 Minutes Intravenous  Once 07/17/14 1818 07/18/14 0024   07/15/14 1400  metroNIDAZOLE (FLAGYL) tablet 500 mg  Status:  Discontinued     500 mg Oral 3 times per day 07/15/14 1202 07/18/14 1533   07/13/14 2200  piperacillin-tazobactam (ZOSYN) IVPB 2.25 g     2.25 g 100 mL/hr over 30 Minutes Intravenous 3 times per day 07/13/14 1318     07/13/14 2000  fluconazole (DIFLUCAN) IVPB 200 mg  Status:  Discontinued     200 mg 100 mL/hr over 60 Minutes Intravenous Every 24 hours 07/13/14 1318 07/19/14 1027   07/07/14 1200  vancomycin (VANCOCIN) IVPB 1000 mg/200 mL premix  Status:  Discontinued     1,000 mg 200 mL/hr over 60 Minutes Intravenous Every 24 hours 07/07/14 0937 07/14/14 1712   07/06/14 2000  fluconazole (DIFLUCAN) IVPB 400 mg  Status:  Discontinued     400 mg 100 mL/hr over 120 Minutes Intravenous Every 24 hours 07/06/14 1507 07/13/14 1318   07/06/14 1800  piperacillin-tazobactam (ZOSYN) IVPB 2.25 g  Status:  Discontinued     2.25 g 100 mL/hr over 30 Minutes Intravenous 4 times per day 07/06/14 1505 07/13/14 1318   07/05/14 2000  fluconazole (DIFLUCAN) IVPB 200 mg  Status:  Discontinued     200 mg 100 mL/hr over 60 Minutes Intravenous Every 24 hours 07/05/14 0757 07/06/14 1507   07/05/14 1400  piperacillin-tazobactam (ZOSYN) IVPB 2.25 g  Status:  Discontinued     2.25 g 100 mL/hr over 30 Minutes Intravenous 3 times per day 07/05/14 0749 07/06/14 1505   07/02/14 1800  vancomycin (VANCOCIN) IVPB 1000 mg/200 mL premix  Status:  Discontinued     1,000 mg 200 mL/hr over 60 Minutes Intravenous Every 24 hours 07/01/14 1858 07/05/14 0801   07/01/14 2200  piperacillin-tazobactam (ZOSYN) IVPB 3.375 g  Status:  Discontinued     3.375 g 100 mL/hr over 30 Minutes Intravenous 4 times per day 07/01/14  1858 07/05/14 0749   07/01/14 2000  fluconazole (DIFLUCAN) IVPB 400 mg  Status:  Discontinued     400 mg 100 mL/hr over 120 Minutes Intravenous Every 24 hours 07/01/14 1858 07/05/14 0757   07/01/14 1400  fluconazole (DIFLUCAN) IVPB 200 mg  Status:  Discontinued     200 mg 100 mL/hr over 60 Minutes Intravenous Every 24 hours 07/01/14 1330 07/01/14 1853   07/01/14 1000  piperacillin-tazobactam (ZOSYN) IVPB 2.25 g  Status:  Discontinued     2.25 g 100 mL/hr over 30 Minutes Intravenous Every 8 hours 07/01/14 0952 07/01/14 1853   07/01/14 1000  vancomycin (VANCOCIN) 500 mg in sodium chloride 0.9 % 100 mL IVPB     500 mg 100 mL/hr over 60 Minutes Intravenous  Once 07/01/14 0952 07/01/14 1126   06/30/14 1200  vancomycin (VANCOCIN) IVPB 1000 mg/200 mL premix     1,000 mg 200 mL/hr over 60 Minutes Intravenous  Once 06/30/14 0944 06/30/14 1403   06/30/14 1200  ceFEPIme (MAXIPIME) 2 g in dextrose 5 % 50 mL IVPB     2 g 100 mL/hr over 30 Minutes Intravenous  Once 06/30/14 0944 06/30/14 1425   06/30/14 0100  vancomycin (VANCOCIN) 2,000 mg in sodium chloride 0.9 % 500 mL IVPB     2,000 mg 250 mL/hr over 120 Minutes Intravenous  Once 06/30/14 0048 06/30/14 0525   06/30/14 0100  ceFEPIme (MAXIPIME) 2  g in dextrose 5 % 50 mL IVPB     2 g 100 mL/hr over 30 Minutes Intravenous  Once 06/30/14 0048 06/30/14 0322   06/27/14 0600  cefUROXime (ZINACEF) 1.5 g in dextrose 5 % 50 mL IVPB     1.5 g 100 mL/hr over 30 Minutes Intravenous On call to O.R. 06/26/14 1019 06/27/14 0630   06/19/14 1800  oseltamivir (TAMIFLU) capsule 30 mg     30 mg Oral Every M-W-F (1800) 06/19/14 1003 06/22/14 1841   06/18/14 1600  oseltamivir (TAMIFLU) capsule 30 mg  Status:  Discontinued     30 mg Oral Daily 06/18/14 1538 06/19/14 1003      Assessment/Plan POD #19 s/p EXPLORATORY LAPAROTOMY WITH CLOSURE OF PERFORATED PYLORIC ULCER, SMALL BOWEL RESECTION - 07/01/2014 - Dalbert Batman Post-operative ileus Intra-abdominal abscess Cefepime  3/29--->3/30 Zosyn 3/30---> Fluconazole 3/30--->4/17 -On soft diet -Open wound, BID wet to dry dressing changes -Needs to mobilize to aid in recovery -Use IS -repeat CT abdomen 4/14--->decreased abscess size. IR did not think drain needs to be repositioned -right abdomen drain minimal serosanguinous -right buttock drain, some purulent output. -Cx negative to date -repeat CT later this week Leukocytosis -WBC normal 4/17 Anemia - 9.1 - improved ESRD - on HD now PCM/TPN -TPN off. Tolerating POs.  Stroke - 06/19/2014 C Diff colitis-PO Vanc dCHF DM PNA -eft hemiplegia - this will probably limit her mobility and return to normal activities Activity - PT following   Erby Pian, [facility] Surgery Pager [phone](7A-4:30P) For consults and floor pages call [phone](7A-4:30P)  07/20/2014 8:58 AM

## 2014-07-20 NOTE — Progress Notes (Signed)
Referring Physician(s): CCS  Subjective:  perf ulcer intr abd abscess drain placed 4/8 CT 4/14- improved but not resolved Output scant in drain---no recording though Pt doing better daily  Allergies: Review of patient's allergies indicates no known allergies.  Medications: Prior to Admission medications   Medication Sig Start Date End Date Taking? Authorizing Provider  allopurinol (ZYLOPRIM) 100 MG tablet Take 100 mg by mouth 2 (two) times daily as needed (for gout).   Yes Historical Provider, MD  citalopram (CELEXA) 20 MG tablet Take 20 mg by mouth daily.   Yes Historical Provider, MD  furosemide (LASIX) 80 MG tablet Take 80 mg by mouth daily.   Yes Historical Provider, MD  hydrOXYzine (ATARAX/VISTARIL) 25 MG tablet Take 25 mg by mouth 3 (three) times daily as needed for anxiety.   Yes Historical Provider, MD  oxyCODONE (ROXICODONE) 5 MG immediate release tablet Take 1 tablet (5 mg total) by mouth every 6 (six) hours as needed for severe pain. Patient not taking: Reported on 06/17/2014 01/28/14   Raymond GurneyKimberly A Trinh, PA-C     Vital Signs: BP 93/68 mmHg  Pulse 96  Temp(Src) 98.4 F (36.9 C) (Oral)  Resp 20  Ht 5\' 6"  (1.676 m)  Wt 99.7 kg (219 lb 12.8 oz)  BMI 35.49 kg/m2  SpO2 98%  Physical Exam  Skin: Skin is warm and dry.  Rt TG drain intact Site clean and dry NT no bleeding Output milky: 10 cc in JP  Cx no growth Afeb; wbc wnl  Nursing note and vitals reviewed.   Imaging: No results found.  Labs:  CBC:  Recent Labs  07/16/14 0440 07/17/14 0410 07/19/14 0535 07/20/14 1040  WBC 12.2* 10.6* 10.1 10.0  HGB 8.8* 9.6* 9.1* 9.2*  HCT 27.1* 29.2* 27.9* 28.7*  PLT 67* 79* 136* 196    COAGS:  Recent Labs  07/01/14 1445 07/01/14 1813 07/02/14 0430 07/03/14 0445 07/04/14 0510 07/07/14 0400 07/10/14 0715  INR 1.44 1.83* 1.51*  --   --   --  1.21  APTT  --   --  35 38* 36 41* 37    BMP:  Recent Labs  07/16/14 0440 07/17/14 0410  07/19/14 0535 07/20/14 1040  NA 128* 126* 131* 134*  K 3.5 5.3* 3.9 4.1  CL 96 94* 95* 96  CO2 21 16* 19 20  GLUCOSE 139* 70 96 116*  BUN 68* 86* 57* 65*  CALCIUM 7.7* 7.8* 7.6* 7.9*  CREATININE 3.53* 4.93* 5.17* 6.48*  GFRNONAA 13* 9* 8* 6*  GFRAA 15* 10* 10* 7*    LIVER FUNCTION TESTS:  Recent Labs  07/09/14 0430  07/13/14 0440  07/15/14 0440 07/16/14 0440 07/17/14 0410 07/20/14 1040  BILITOT 2.0*  --  1.9*  --   --  1.8* 1.7*  --   AST 17  --  60*  --   --  21 21  --   ALT 29  --  55*  --   --  43* 37*  --   ALKPHOS 111  --  155*  --   --  108 119*  --   PROT 4.9*  --  5.8*  --   --  5.3* 5.9*  --   ALBUMIN 1.2*  < > 1.7*  < > 1.6* 1.6* 1.6* 1.6*  < > = values in this interval not displayed.  Assessment and Plan:  Perforated ulcer Intra abd abscess drain placed 4/8 Better 4/14 CT reveals abscess smaller but not resolved  Cont drain Need to record output- order in again Should re CT 4/21   Signed: Magic Mohler A 07/20/2014, 2:08 PM   I spent a total of 15 Minutes in face to face in clinical consultation/evaluation, greater than 50% of which was counseling/coordinating care for abscess drain

## 2014-07-20 NOTE — Progress Notes (Signed)
Pt has increased HR; metoprolol 5mg  IV given; after tx ended HR >140 sustained; called Dr. Tresa EndoKelly cardiologist, he ordered metoprolol 25mg  PO once. Pt HR came down to low 100's before she was taken back to her 5pm

## 2014-07-20 NOTE — Progress Notes (Signed)
Progress Note [facility].   [name] FAO:[id number] DOB: 07/08/1955 DOA: 06/17/2014 PCP: [name], MD  Admit HPI / Brief Narrative: 59 yo female smoker with hx HTN, ESRD on HD (just began this admit), COPD, failed L AV fistula, initially admitted 3/16 with flu and acute hypoxic resp failure. Ultimately tx to [facility] due to need for HD. 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 Made little improvement and on 3/30 had worsening hypoxia, generalized pain, hyperkalemia, worsening leukocytosis and PCCM consulted.    HPI/Subjective: She is lying down in bed.  Relates mild abdominal soreness.  Diarrhea improving.   Assessment/Plan:  C. difficile colitis C diff positive on 4-12.  Received 4 flagyl without improvement.  Continue with  Vancomycin, oral. Day 3  Acute hypoxic respiratory failure secondary to H1 N1 influenza +/- HCAP Stable.   PSVT Has been evaluated by EP - TSH is normal - Cardiology following - follow K+ and Mg  Continue with metoprolol , cardiology adjusting medications.   Small PFO Has been evaluated by Cards  ACA CVA with Lt sided weakness Will need ongoing long term rehab - PT/OT to cont to follow   ESRD new start HD this admit  Ongoing hemodialysis per Nephrology - permcath placed 3/26 d/t AVF problems; when stable [facility] wants to considering revision of AVF Electrolytes abnormalities corrected with dialysis. Hyponatremia, acidosis.   Status post exploratory laparotomy with closure of perforated pyloric ulcer / small bowel resection w/ postop ileus General Surgery continues to follow and is advancing diet - now off TNA and on regular diet  Pelvic abscess status post percutaneous drain placement 4/8 Ongoing care per general surgery and IR Day 20 zosyn.  Received  18 days of vancomycin and  18 days  of fluconazole.  Body fluid grew lactobacillus species.  Plan to repeat ct scan next week.   Chronic diastolic congestive heart  failure w/ Severe RHF EF 60-65%, grade 1 diastolic dysfunction. Appears compensated. Fluids manage with dialysis.   Anemia of critical illness and chronic disease Hgb presently stable s/p 2U PRBC 4/13 - follow  HB stable.   Thrombocytopenia Serotonin assay negative - plt count now climbing again - follow trend - no evidence of blood loss at present  Platelet increasing.   COPD Stable at present  Diabetes mellitus 2 CBG reasonably controlled - cont to follow  Hypoglycemia this am. Not on long acting insulin.   Obesity - Body mass index is 35.49 kg/(m^2).  Code Status: FULL Family Communication: no family present at time of exam Disposition Plan:  continue PT/OT - will need long-term rehabilitation stay in hemodialysis capable nursing home or LTAC   Consultants: Cardiology Nephrology PCCM Gen Surgery   Significant Events: 3/16 influenza A+ 3/18 R ACA stroke 3/19 MRI multifocal acute infarction of both hemispheres, large right distal ACA 3/22 TEE severe RAE, severely reduced RV function, severe TR, no LAA thrombus, small PFO on TEE 3/31 to OR ex lap for per pyloric ulcer and sm bowel resection 4/1 septic shock on pressors 4/3 pressors weaned off  4/4 restart pressors, CRRT 4/6 levo switched to neo 4/7 pelvic abscess on CT 4/8 IR placed pelvic drain 4/9 off pressors 4/11 long run of SVT.  Antibiotics: Zosyn > Oral Flagyl 4/13 >  DVT prophylaxis: SCDs  Objective: Blood pressure 112/77, pulse 103, temperature 98.4 F (36.9 C), temperature source Oral, resp. rate 21, height  (1.676 m), weight 99.7 kg (219 lb  12.8 oz), SpO2 98 %.  Intake/Output Summary (Last 24 hours) at 07/20/14 1449 Last data filed at 07/20/14 1000  Gross per 24 hour  Intake    260 ml  Output      4 ml  Net    256 ml   Exam: General: No acute respiratory distress - alert and conversant  Lungs: bilateral ronchus.  Cardiovascular: Regular rate and rhythm without murmur gallop  rub Abdomen: Abdominal wound is dressed and dry, abdomen nondistended, , drain intact  Extremities: No significant cyanosis, clubbing, edema bilateral lower extremities  Data Reviewed: Basic Metabolic Panel:  Recent Labs Lab 07/13/14 1600 07/14/14 0455 07/15/14 0440 07/16/14 0440 07/17/14 0410 07/19/14 0535 07/20/14 1040  NA 130* 130* 125* 128* 126* 131* 134*  K 3.9 3.4* 3.9 3.5 5.3* 3.9 4.1  CL 98 97 93* 96 94* 95* 96  CO2 23 21 18* 21 16* 19 20  GLUCOSE 191* 102* 126* 139* 70 96 116*  BUN 42* 68* 81* 68* 86* 57* 65*  CREATININE 1.75* 2.63* 3.60* 3.53* 4.93* 5.17* 6.48*  CALCIUM 7.9* 7.9* 7.9* 7.7* 7.8* 7.6* 7.9*  MG  --  2.0 1.8 1.7 1.9  --   --   PHOS 3.2 2.6 3.7 3.9  --   --  7.9*    Liver Function Tests:  Recent Labs Lab 07/14/14 0455 07/15/14 0440 07/16/14 0440 07/17/14 0410 07/20/14 1040  AST  --   --  21 21  --   ALT  --   --  43* 37*  --   ALKPHOS  --   --  108 119*  --   BILITOT  --   --  1.8* 1.7*  --   PROT  --   --  5.3* 5.9*  --   ALBUMIN 1.6* 1.6* 1.6* 1.6* 1.6*   CBC:  Recent Labs Lab 07/14/14 0455 07/15/14 0440 07/16/14 0440 07/17/14 0410 07/19/14 0535 07/20/14 1040  WBC 12.9* 13.4* 12.2* 10.6* 10.1 10.0  NEUTROABS 9.7* 10.7*  --   --   --   --   HGB 7.3* 7.1* 8.8* 9.6* 9.1* 9.2*  HCT 23.2* 22.3* 27.1* 29.2* 27.9* 28.7*  MCV 86.2 87.8 86.3 84.9 86.4 87.2  PLT 99* 79* 67* 79* 136* 196    CBG:  Recent Labs Lab 07/19/14 2150 07/20/14 0454 07/20/14 0738 07/20/14 0852 07/20/14 1159  GLUCAP 71 59* 53* 72 98    Recent Results (from the past 240 hour(s))  Culture, routine-abscess     Status: None   Collection Time: 07/10/14  5:15 PM  Result Value Ref Range Status   Specimen Description ABSCESS RIGHT HIP  Final   Special Requests Normal  Final   Gram Stain   Final    FEW WBC PRESENT,BOTH PMN AND MONONUCLEAR NO SQUAMOUS EPITHELIAL CELLS SEEN NO ORGANISMS SEEN Performed at [facility]    Culture   Final    NO GROWTH  3 DAYS Performed at [facility]    Report Status 07/14/2014 FINAL  Final  Anaerobic culture     Status: None   Collection Time: 07/10/14  5:15 PM  Result Value Ref Range Status   Specimen Description ABSCESS RIGHT HIP  Final   Special Requests Normal  Final   Gram Stain   Final    FEW WBC PRESENT, PREDOMINANTLY PMN NO SQUAMOUS EPITHELIAL CELLS SEEN NO ORGANISMS SEEN Performed at [facility]    Culture   Final    NO ANAEROBES ISOLATED  Performed at [facility]    Report Status 07/15/2014 FINAL  Final  Culture, blood (routine x 2)     Status: None   Collection Time: 07/13/14  3:45 PM  Result Value Ref Range Status   Specimen Description BLOOD RIGHT HAND  Final   Special Requests BOTTLES DRAWN AEROBIC ONLY 3CC  Final   Culture   Final    NO GROWTH 5 DAYS Performed at [facility]    Report Status 07/20/2014 FINAL  Final  Culture, blood (routine x 2)     Status: None   Collection Time: 07/13/14  4:00 PM  Result Value Ref Range Status   Specimen Description BLOOD RIGHT HAND  Final   Special Requests BOTTLES DRAWN AEROBIC ONLY 2CC  Final   Culture   Final    NO GROWTH 5 DAYS Note: Culture results may be compromised due to an inadequate volume of blood received in culture bottles. Performed at [facility]    Report Status 07/20/2014 FINAL  Final  Clostridium Difficile by PCR     Status: Abnormal   Collection Time: 07/14/14 10:15 PM  Result Value Ref Range Status   C difficile by pcr POSITIVE (A) NEGATIVE Final    Comment: CRITICAL RESULT CALLED TO, READ BACK BY AND VERIFIED WITH: Candy Sledge RN 9:45 07/15/14 ([email or web address])      Studies:   Recent x-ray studies have been reviewed in detail by the Attending Physician  Scheduled Meds:  Scheduled Meds: . darbepoetin (ARANESP) injection - DIALYSIS  150 mcg Intravenous Q Mon-HD  . feeding supplement (NEPRO CARB STEADY)  237 mL Oral BID BM  . feeding supplement (RESOURCE BREEZE)  1  Container Oral Q1500  . insulin aspart  0-9 Units Subcutaneous TID WC  . ipratropium  0.5 mg Nebulization TID  . levalbuterol  0.63 mg Nebulization TID  . metoprolol tartrate  12.5 mg Oral BID  . midodrine      . midodrine  10 mg Oral BID WC  . piperacillin-tazobactam (ZOSYN)  IV  2.25 g Intravenous 3 times per day  . sodium chloride  10-40 mL Intracatheter Q12H  . vancomycin  125 mg Oral 4 times per day  . zolpidem  5 mg Oral Once    Time spent on care of this patient: 35 mins   Alba Cory , MD  [phone] [facility] Office  [phone] Pager - Text Page per Loretha Stapler as per below:  On-Call/Text Page:      [email or web address]      password [email or web address]  If 7PM-7AM, please contact night-coverage [email or web address] Password [email or web address] 07/20/2014, 2:49 PM   LOS: 33 days

## 2014-07-20 NOTE — Progress Notes (Signed)
OT Cancellation Note  Patient Details Name: [name] MRN: [id number] DOB: 01-10-1956   Cancelled Treatment:    Reason Eval/Treat Not Completed: Patient at procedure or test/ unavailable (HD)  [name], Jamilah Jean Lynn 07/20/2014, 3:17 PM

## 2014-07-20 NOTE — Progress Notes (Signed)
ANTIBIOTIC CONSULT NOTE - FOLLOW UP  Pharmacy Consult for Zosyn Indication: pneumonia/peritonitis  No Known Allergies  Patient Measurements: Height: 5\' 6"  (167.6 cm) Weight: 219 lb 12.8 oz (99.7 kg) IBW/kg (Calculated) : 59.3 Adjusted Body Weight: n/a  Vital Signs: Temp: 98.2 F (36.8 C) (04/18 1000) Temp Source: Oral (04/18 1000) BP: 95/63 mmHg (04/18 1000) Pulse Rate: 98 (04/18 1000) Intake/Output from previous day: 04/17 0701 - 04/18 0700 In: 770 [P.O.:700; I.V.:20; IV Piggyback:50] Out: 3 [Stool:3] Intake/Output from this shift: Total I/O In: 120 [P.O.:120] Out: 2 [Urine:1; Stool:1]  Labs:  Recent Labs  07/19/14 0535 07/20/14 1040  WBC 10.1 10.0  HGB 9.1* 9.2*  PLT 136* 196  CREATININE 5.17* 6.48*   Estimated Creatinine Clearance: 11.3 mL/min (by C-G formula based on Cr of 6.48). No results for input(s): VANCOTROUGH, VANCOPEAK, VANCORANDOM, GENTTROUGH, GENTPEAK, GENTRANDOM, TOBRATROUGH, TOBRAPEAK, TOBRARND, AMIKACINPEAK, AMIKACINTROU, AMIKACIN in the last 72 hours.   Microbiology: Recent Results (from the past 720 hour(s))  Clostridium Difficile by PCR     Status: None   Collection Time: 06/27/14  7:40 PM  Result Value Ref Range Status   C difficile by pcr NEGATIVE NEGATIVE Final  Culture, blood (routine x 2)     Status: None   Collection Time: 06/29/14  8:10 AM  Result Value Ref Range Status   Specimen Description BLOOD RIGHT ANTECUBITAL  Final   Special Requests BOTTLES DRAWN AEROBIC ONLY 3CC  Final   Culture   Final    NO GROWTH 5 DAYS Performed at [facility]    Report Status 07/05/2014 FINAL  Final  Culture, blood (routine x 2)     Status: None   Collection Time: 06/29/14  8:15 AM  Result Value Ref Range Status   Specimen Description BLOOD RIGHT HAND  Final   Special Requests BOTTLES DRAWN AEROBIC ONLY 2CC  Final   Culture   Final    NO GROWTH 5 DAYS Note: Culture results may be compromised due to an inadequate volume of blood  received in culture bottles. Performed at [facility]    Report Status 07/05/2014 FINAL  Final  MRSA PCR Screening     Status: None   Collection Time: 07/01/14  2:49 PM  Result Value Ref Range Status   MRSA by PCR NEGATIVE NEGATIVE Final    Comment:        The GeneXpert MRSA Assay (FDA approved for NASAL specimens only), is one component of a comprehensive MRSA colonization surveillance program. It is not intended to diagnose MRSA infection nor to guide or monitor treatment for MRSA infections.   Anaerobic culture     Status: None   Collection Time: 07/01/14  4:18 PM  Result Value Ref Range Status   Specimen Description FLUID PERITONEAL  Final   Special Requests ON SWAB  Final   Gram Stain   Final    RARE WBC PRESENT, PREDOMINANTLY MONONUCLEAR NO ORGANISMS SEEN Performed at [facility]    Culture   Final    NO ANAEROBES ISOLATED Performed at [facility]    Report Status 07/07/2014 FINAL  Final  Body fluid culture     Status: None   Collection Time: 07/01/14  4:18 PM  Result Value Ref Range Status   Specimen Description FLUID PERITONEAL  Final   Special Requests ON SWAB  Final   Gram Stain   Final    RARE WBC PRESENT, PREDOMINANTLY MONONUCLEAR NO ORGANISMS SEEN Performed at [facility]  Culture   Final    RARE LACTOBACILLUS SPECIES Note: Standardized susceptibility testing for this organism is not available. CRITICAL RESULT CALLED TO, READ BACK BY AND VERIFIED WITH: CHRIS RN ON 2H AT 1047 [id number] BY CASTC Performed at [facility]    Report Status 07/04/2014 FINAL  Final  Culture, routine-abscess     Status: None   Collection Time: 07/10/14  5:15 PM  Result Value Ref Range Status   Specimen Description ABSCESS RIGHT HIP  Final   Special Requests Normal  Final   Gram Stain   Final    FEW WBC PRESENT,BOTH PMN AND MONONUCLEAR NO SQUAMOUS EPITHELIAL CELLS SEEN NO ORGANISMS SEEN Performed at [facility]     Culture   Final    NO GROWTH 3 DAYS Performed at [facility]    Report Status 07/14/2014 FINAL  Final  Anaerobic culture     Status: None   Collection Time: 07/10/14  5:15 PM  Result Value Ref Range Status   Specimen Description ABSCESS RIGHT HIP  Final   Special Requests Normal  Final   Gram Stain   Final    FEW WBC PRESENT, PREDOMINANTLY PMN NO SQUAMOUS EPITHELIAL CELLS SEEN NO ORGANISMS SEEN Performed at [facility]    Culture   Final    NO ANAEROBES ISOLATED Performed at [facility]    Report Status 07/15/2014 FINAL  Final  Culture, blood (routine x 2)     Status: None   Collection Time: 07/13/14  3:45 PM  Result Value Ref Range Status   Specimen Description BLOOD RIGHT HAND  Final   Special Requests BOTTLES DRAWN AEROBIC ONLY 3CC  Final   Culture   Final    NO GROWTH 5 DAYS Performed at [facility]    Report Status 07/20/2014 FINAL  Final  Culture, blood (routine x 2)     Status: None   Collection Time: 07/13/14  4:00 PM  Result Value Ref Range Status   Specimen Description BLOOD RIGHT HAND  Final   Special Requests BOTTLES DRAWN AEROBIC ONLY 2CC  Final   Culture   Final    NO GROWTH 5 DAYS Note: Culture results may be compromised due to an inadequate volume of blood received in culture bottles. Performed at [facility]    Report Status 07/20/2014 FINAL  Final  Clostridium Difficile by PCR     Status: Abnormal   Collection Time: 07/14/14 10:15 PM  Result Value Ref Range Status   C difficile by pcr POSITIVE (A) NEGATIVE Final    Comment: CRITICAL RESULT CALLED TO, READ BACK BY AND VERIFIED WITH: Candy Sledge RN 9:45 07/15/14 ([email or web address])     Assessment: 59 yo female continuing on Zosyn for possible PNA and peritonitis. She is now s/p Ex-lap with evacuation of ascites, closure of perforated pyloric ulcer and resection of ileum with primary anatomosis. Also on PO vancomycin for C diff. She is afebrile, WBC are normal, and  cultures have been neg except for C diff. Patient now with ESRD and transitioned to HD.  Vancomycin 3/29>>4/17  Cefepime 3/29>3/30  Zosyn 3/30>  Fluconazole 3/20>>4/17  Flagyl 4/12> (cdiff) - 4/16]  Vancomycin PO 4/16>> (Plan for 14 days)   4/3: VR 38.5 ~24h post-crrt, no UOP recorded  4/4: Resuming CRRT about 8 PM  4/5 0400 - VR = 24.7  4/11: VR =32 :58, on vanc 1g IV q24h ( ), on CRRT, not sure why we did not check a  trough .  4/11: VT = 29.4 @ 1200; Ke on CRRT = 0.012; T1/2 = 57 hrs; CRRT off at 1800 PM  4/12: VR = 24.7  4/12pm - only tolerated 45 mins of HD and BFR didn't even get up to 400. So, didn't redose. d/c'ed standing vanc order from [facility]. Need to write order tom if need to redose  4/13 HD BFR 200 x3hr no redose - check leve pre next HD  4/15: 27.3 - HD planned today - Received Vancomycin 750 mg X 1   3/17 flu +  3/30 peritoneal fluid- rare lactobacillus (no sensitivies performed; usually sensitive to PCN)  3/28 blood x2 - neg  3/26 c diff - neg  4/8 anaer: neg  4/8 R hip abscess: neg  4/12 Cdiff + - added po metronidazole  4/11 - BCx > neg  4/12 - cdiff: +  Goal of Therapy:  Eradication of infection  Plan:  - Continue Zosyn 2.25 g IV q8h - Pharmacy signing off, please re-consult if needed  [facility], [place].D., BCPS Clinical Pharmacist Pager: [phone] 07/20/2014 1:13 PM

## 2014-07-20 NOTE — Care Management Note (Signed)
CARE MANAGEMENT NOTE 07/20/2014  Patient:  [name],[name]   Account Number:  [id number]  Date Initiated:  06/18/2014  Documentation initiated by:  DAVIS,RHONDA  Subjective/Objective Assessment:   resp. failure, acute renal failure, multiple system failure     Action/Plan:   [facility]   lives w fam, pcp dr Minerva Areolaeric dean   Anticipated DC Date:  07/22/2014   Anticipated DC Plan:  SKILLED NURSING FACILITY  In-house referral  Clinical Social Worker      DC Planning Services  CM consult      [facility]  NA   Choice offered to / List presented to:  NA           Status of service:  In process, will continue to follow Medicare Important Message given?  NO (If response is "NO", the following Medicare IM given date fields will be blank) Date Medicare IM given:   Medicare IM given by:   Date Additional Medicare IM given:   Additional Medicare IM given by:    Discharge Disposition:    Per UR Regulation:  Reviewed for med. necessity/level of care/duration of stay  If discussed at Long Length of Stay Meetings, dates discussed:   06/30/2014  07/02/2014  07/09/2014  07/14/2014  07/16/2014    Comments:  Contact:  Dehner,[name] Daughter [phone]  07/20/14 Pt now on [address], noted request for LTAC, however this pt has Medicaid benefits only. This CM validated with [facility] that they can not accept Medicaid. As LTAC will not accept Medicaid benefits therefore we may need to consider short term SNF.   CRoyal RN MPH, case manager, [phone]  07-13-14  8:50am Avie ArenasSarah Brown, RNBSN [phone] Remains on CRRT - off pressors on 4-9.  Remains on TPN. Talking about GOC discussion.  07-05-14 8:15am Avie ArenasSarah Brown, RNBSN [phone] Extubated yesterday. Awake and following commands, continues on CRRT but plan to transition back to HD.  07-02-14 8:30am Avie ArenasSarah Brown, RNBSN [phone] Patient post Perforated pyloric ulcer with diffuse peritonitis, ischemic  necrosis of mid ileum, possibly embolic - now post op - in ICU on vent, pressors and CRRT.  06/29/14- 1500- Donn PieriniKristi Webster RN, BSN [phone] Spoke with Dr. Carl BestMikhaail regarding LTACH option- MD feels like LTACH would be pt's best option at this time as pt is not tolerating HD- did speak with both [facility] and [facility] to ask them to take a look at pt for appropriateness- pt is self pay/medicaid pending- and [facility]- will continue to explore this option- will discuss in [facility] mtg in am also.  CIR is also following pt.  1602 06-26-14 Tomi BambergerBrenda Graves-Bigelow, RN,BSN [phone] CM did speak with MD Mikhaail in ref to disposition. CIR is looking at pt. CM did mention LTAC as a possibility. Pt new HD and planned for temporary cath and HD 06-27-14.Pt without insurance. Pt with SVT- not stable for d/[name] per MD. CM to continue to monitor for disposition needs.  06/24/14 1450 Henrietta Mayo RN MSN BSN CCM Pt with bursts of SVT, cards adjusting meds.  TEE: severe RV dysfunction; severe RAE; severe TR; positive saline microcavitation study consistent with PFO; note patient with incessant atrial tachycardia during the procedure.  06/22/14 0800 Henrietta Mayo RN MSN BSN CCM Transferred to 2C, lives with familyl, initiated on HD 3/17,  head CT showed acute infarct.  June 18, 2014/Rhonda L. Earlene Plateravis, RN, BSN, CCM. Case Management  Systems  [phone] No discharge needs present of time of review.

## 2014-07-20 NOTE — Progress Notes (Signed)
[facility] Progress Note   Subjective: no new complaints  Filed Vitals:   07/19/14 1900 07/20/14 0509 07/20/14 0934 07/20/14 1000  BP: 118/79 112/87  95/63  Pulse: 96 95  98  Temp: 97.7 F (36.5 C) 97.6 F (36.4 C)  98.2 F (36.8 C)  TempSrc: Oral Oral  Oral  Resp: Height:      Weight:      SpO2: 98% 94% 95% 98%   Exam: Alert, chronically ill-appearing No jvd Chest is clear bilat RRR no MRG Abd soft, NTND, +BS 1-2+ pitting edema of hips/ flanks and LUE Neuro can move L arm and leg minimally, alert and oriented  HD: new start, on MWF schedule here. Lowest wt here 90kg approx        Assessment: 1. New ESRD - started HD 3/17. Outpt HD slot should be in soon 2. Vol excess- up 5-10kg, edematous 3. Perforated pyloric ulcer / necrotic small bowel - s/p repair of perf ulcer w partial ileum resection 3/30 4. Recurrent sepsis - improving s/p perc drainage of pelvic fluid collection; on flagyl/ zosyn 5. S/P acute ACA stroke / left hemiparesis 6. Nutrition / ileus - diet progressing 7. Anemia s/p PRBCs 8. 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 HD access - Permcath placed 3/26 d/t AVF problems; when stable VVS wants to considering revision of AVF 10. PSVT followed by EPS/Cards  Plan - HD today, max UF, start midodrine    [name] MD  pager [phone]    cell [phone]  07/20/2014, 12:30 PM     Recent Labs Lab 07/15/14 0440 07/16/14 0440 07/17/14 0410 07/19/14 0535 07/20/14 1040  NA 125* 128* 126* 131* 134*  K 3.9 3.5 5.3* 3.9 4.1  CL 93* 96 94* 95* 96  CO2 18* 21 16* 19 20  GLUCOSE 126* 139* 70 96 116*  BUN 81* 68* 86* 57* 65*  CREATININE 3.60* 3.53* 4.93* 5.17* 6.48*  CALCIUM 7.9* 7.7* 7.8* 7.6* 7.9*  PHOS 3.7 3.9  --   --  7.9*    Recent Labs Lab 07/16/14 0440 07/17/14 0410 07/20/14 1040  AST 21 21  --   ALT 43* 37*  --   ALKPHOS 108 119*  --   BILITOT 1.8* 1.7*  --   PROT 5.3* 5.9*  --   ALBUMIN 1.6* 1.6* 1.6*     Recent Labs Lab 07/14/14 0455 07/15/14 0440  07/17/14 0410 07/19/14 0535 07/20/14 1040  WBC 12.9* 13.4*  < > 10.6* 10.1 10.0  NEUTROABS 9.7* 10.7*  --   --   --   --   HGB 7.3* 7.1*  < > 9.6* 9.1* 9.2*  HCT 23.2* 22.3*  < > 29.2* 27.9* 28.7*  MCV 86.2 87.8  < > 84.9 86.4 87.2  PLT 99* 79*  < > 79* 136* 196  < > = values in this interval not displayed. . darbepoetin (ARANESP) injection - DIALYSIS  150 mcg Intravenous Q Mon-HD  . feeding supplement (RESOURCE BREEZE)  1 Container Oral TID BM  . insulin aspart  0-9 Units Subcutaneous TID WC  . ipratropium  0.5 mg Nebulization TID  . levalbuterol  0.63 mg Nebulization TID  . metoprolol tartrate  12.5 mg Oral BID  . piperacillin-tazobactam (ZOSYN)  IV  2.25 g Intravenous 3 times per day  . sodium chloride  10-40 mL Intracatheter Q12H  . vancomycin  125 mg Oral 4 times per day  . zolpidem  5 mg  Oral Once     sodium chloride, sodium chloride, sodium chloride, sodium chloride, anticoagulant sodium citrate, bisacodyl, feeding supplement (NEPRO CARB STEADY), fentaNYL (SUBLIMAZE) injection, HYDROcodone-acetaminophen, lidocaine (PF), lidocaine-prilocaine, metoprolol, morphine injection, ondansetron (ZOFRAN) IV, pentafluoroprop-tetrafluoroeth, sodium chloride

## 2014-07-20 NOTE — Progress Notes (Signed)
NUTRITION FOLLOW-UP   DOCUMENTATION CODES Per approved criteria  -Obesity Unspecified   INTERVENTION: Initiate 48 hour calorie count.  Provide Resource Breeze po once daily, each supplement provides 250 kcal and 9 grams of protein.  Provide Nepro Shake po BID, each supplement provides 425 kcal and 19 grams protein  Provide nourishment snacks (chicken salad sandwich). Ordered.  Encourage adequate PO intake.   NUTRITION DIAGNOSIS: Inadequate oral intake  related to altered GI function as evidenced by limited intake, ongoing.   Goal: Pt to meet >/= 90% of their estimated nutrition needs, not met  Monitor:  Weight trends, labs, I/O's, diet advancement, PO intake, supplement acceptance  ASSESSMENT: Pt with CKD and hypertension presents with of shortness of breath and generalized weakness x 2 weeks. Pt found to have metabolic acidosis from worsening uremia, H1N1 flu. Pt started on HD. 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  Pt s/p exp lab, evacuation of ascites, closure of ulcer, 14 in resection of ileum due to perforated pyloric ulcer with diffuse peritonitis, ischemic necrosis of mid ileum. Pt extubated 4/2.  Pt previously on CRRT transitioned to IHD 4/12. TPN d/c'ed 4/15.  48 hour calorie count initiated 4/15, however no record of calorie count done over weekend. RD to reinitiate 48 calorie count. RN made aware. Per RN, pt with very poor po intake. Pt however has been drinking her [employer]. Pt is agreeable to trying Nepro Shake. Pt reports she would additionally like chicken salad sandwiches for snack. RD to order. RD to follow up tomorrow 4/19 with Day calorie count results.   Labs: Low sodium, calcium, and GFR. High BUN, creatinine, and phosphorous (7.9).  Height: Ht Readings from Last 1 Encounters:  07/13/14 $RemoveB'5\' 6"'DcIFPeZr$  (1.676 m)    Weight: Wt Readings from Last 1 Encounters:  07/19/14 219 lb 12.8 oz (99.7  kg)  Usual weight: 220 lb (100 kg)  BMI:  Body mass index is 35.49 kg/(m^2). Class II obesity  Adjusted body weight: 91.3 kg  Estimated Nutritional Needs: Kcal: 2100-2300 Protein: 120-140 grams Fluid: 1.2 L/day  Skin: +2 generalized edema, incision on abdomen.  Diet Order: Diet Carb Modified Fluid consistency:: Thin; Room service appropriate?: Yes   Intake/Output Summary (Last 24 hours) at 07/20/14 1324 Last data filed at 07/20/14 0900  Gross per 24 hour  Intake    500 ml  Output      5 ml  Net    495 ml    Last BM: 4/17  Labs:   Recent Labs Lab 07/15/14 0440 07/16/14 0440 07/17/14 0410 07/19/14 0535 07/20/14 1040  NA 125* 128* 126* 131* 134*  K 3.9 3.5 5.3* 3.9 4.1  CL 93* 96 94* 95* 96  CO2 18* 21 16* 19 20  BUN 81* 68* 86* 57* 65*  CREATININE 3.60* 3.53* 4.93* 5.17* 6.48*  CALCIUM 7.9* 7.7* 7.8* 7.6* 7.9*  MG 1.8 1.7 1.9  --   --   PHOS 3.7 3.9  --   --  7.9*  GLUCOSE 126* 139* 70 96 116*    CBG (last 3)   Recent Labs  07/20/14 0738 07/20/14 0852 07/20/14 1159  GLUCAP 53* 72 98    Scheduled Meds: . darbepoetin (ARANESP) injection - DIALYSIS  150 mcg Intravenous Q Mon-HD  . feeding supplement (RESOURCE BREEZE)  1 Container Oral TID BM  . insulin aspart  0-9 Units Subcutaneous TID WC  . ipratropium  0.5 mg Nebulization TID  . levalbuterol  0.63 mg Nebulization TID  . metoprolol tartrate  12.5 mg Oral BID  . midodrine      . midodrine  10 mg Oral BID WC  . piperacillin-tazobactam (ZOSYN)  IV  2.25 g Intravenous 3 times per day  . sodium chloride  10-40 mL Intracatheter Q12H  . vancomycin  125 mg Oral 4 times per day  . zolpidem  5 mg Oral Once    Continuous Infusions:   Kallie Locks, MS, RD, LDN Pager # [phone] After hours/ weekend pager # [phone]

## 2014-07-21 LAB — RENAL FUNCTION PANEL
ALBUMIN: 1.7 g/dL — AB (ref 3.5–5.2)
ANION GAP: 15 (ref 5–15)
BUN: 26 mg/dL — AB (ref 6–23)
CHLORIDE: 98 mmol/L (ref 96–112)
CO2: 25 mmol/L (ref 19–32)
Calcium: 7.8 mg/dL — ABNORMAL LOW (ref 8.4–10.5)
Creatinine, Ser: 3.89 mg/dL — ABNORMAL HIGH (ref 0.50–1.10)
GFR calc Af Amer: 14 mL/min — ABNORMAL LOW (ref >90)
GFR calc non Af Amer: 12 mL/min — ABNORMAL LOW (ref >90)
GLUCOSE: 94 mg/dL (ref 70–99)
Phosphorus: 4.6 mg/dL (ref 2.3–4.6)
Potassium: 3.7 mmol/L (ref 3.5–5.1)
Sodium: 138 mmol/L (ref 135–145)

## 2014-07-21 LAB — CBC
HEMATOCRIT: 29.2 % — AB (ref 36.0–46.0)
HEMOGLOBIN: 9.3 g/dL — AB (ref 12.0–15.0)
MCH: 28.1 pg (ref 26.0–34.0)
MCHC: 31.8 g/dL (ref 30.0–36.0)
MCV: 88.2 fL (ref 78.0–100.0)
Platelets: 225 10*3/uL (ref 150–400)
RBC: 3.31 MIL/uL — AB (ref 3.87–5.11)
RDW: 22.3 % — ABNORMAL HIGH (ref 11.5–15.5)
WBC: 9.3 10*3/uL (ref 4.0–10.5)

## 2014-07-21 LAB — HEPATITIS B SURFACE ANTIGEN: HEP B S AG: NEGATIVE

## 2014-07-21 LAB — GLUCOSE, CAPILLARY
GLUCOSE-CAPILLARY: 98 mg/dL (ref 70–99)
Glucose-Capillary: 101 mg/dL — ABNORMAL HIGH (ref 70–99)
Glucose-Capillary: 80 mg/dL (ref 70–99)
Glucose-Capillary: 141 mg/dL — ABNORMAL HIGH (ref 70–99)

## 2014-07-21 MED ORDER — AMIODARONE HCL 200 MG PO TABS
200.0000 mg | ORAL_TABLET | Freq: Two times a day (BID) | ORAL | Status: DC
  Administered 2014-07-21 – 2014-08-03 (×24): 200 mg via ORAL
  Filled 2014-07-21 (×28): qty 1

## 2014-07-21 NOTE — Progress Notes (Signed)
Day 1 of 2-Calorie Count Note  48 hour calorie count ordered.  Diet: Carbohydrate modified  Supplements:   Nepro Shake po BID, each supplement provides 425 kcal and 19 grams protein.  Resource Breeze po once daily, each supplement provides 250 kcal and 9 grams of protein  Breakfast: 0% meal completion Lunch: 438 kcal, 25 grams of protein Dinner: 240 kcal, 6 grams of protein Supplements: 250 kcal, 9 grams of protein  Day 1 Total intake: 928 kcal (44% of minimum estimated needs)  40 grams of protein (33% of minimum estimated needs)  Re-Estimated Nutrition Needs: Kcal: 2100-2300 Protein: 120-140 grams Fluid: 1.2 L/day  Nutrition Dx:  Inadequate oral intake related to altered GI function as evidenced by limited intake; ongoing  Goal:  Pt to meet >/= 90% of their estimated nutrition needs; not met  Intervention:   Continue calorie count.  Continue Nepro Shake po BID, each supplement provides 425 kcal and 19 grams protein.  Continue Resource Breeze po once daily, each supplement provides 250 kcal and 9 grams of protein.  Provide nourishment snacks. Ordered.  Encourage adequate PO intake.  RD to follow up tomorrow, 07/22/14, with day 2 calorie count results.  Kallie Locks, MS, RD, LDN Pager # [phone] After hours/ weekend pager # [phone]

## 2014-07-21 NOTE — Progress Notes (Signed)
Subjective:   No current complaints, diarrhea last night, no chest pain or dyspnea  Objective: Vital signs in last 24 hours: Temp:  [97.1 F (36.2 C)-98.4 F (36.9 C)] 98 F (36.7 C) (04/19 0430) Pulse Rate:  [67-142] 102 (04/19 0430) Resp:  [16-25] 16 (04/19 0430) BP: (82-119)/(22-85) 89/57 mmHg (04/19 0430) SpO2:  [89 %-98 %] 94 % (04/19 0430) FiO2 (%):  [28 %] 28 % (04/18 0934) Weight:  [96.4 kg (212 lb 8.4 oz)] 96.4 kg (212 lb 8.4 oz) (04/19 0425) Weight change:   Intake/Output from previous day: 04/18 0701 - 04/19 0700 In: 240 [P.O.:240] Out: 3339 [Urine:1; Drains:35; Stool:3] Intake/Output this shift:   Lab Results:  Recent Labs  07/20/14 1040 07/21/14 0508  WBC 10.0 9.3  HGB 9.2* 9.3*  HCT 28.7* 29.2*  PLT 196 225   BMET:  Recent Labs  07/20/14 1040 07/21/14 0508  NA 134* 138  K 4.1 3.7  CL 96 98  CO2 20 25  GLUCOSE 116* 94  BUN 65* 26*  CREATININE 6.48* 3.89*  CALCIUM 7.9* 7.8*  ALBUMIN 1.6* 1.7*   No results for input(s): PTH in the last 72 hours. Iron Studies: No results for input(s): IRON, TIBC, TRANSFERRIN, FERRITIN in the last 72 hours.  Studies/Results: No results found.  EXAM: General appearance:  Alert, in no apparent distress Resp:  CTA without rales, rhonchi, or wheezes Cardio:  RRR without murmur or rub GI:  + BS, soft with mild tenderness Extremities:  1+ edema @ hips, flanks, LUE Access:  R IJ catheter  HD: new start, on MWF schedule here. Lowest wt here 90kg approx   Assessment: 1. New ESRD - started HD 3/17, using R IJ catheter.  Do not stop pulling fluid for SVT on dialysis as long as BP stable. 3.5kg off w HD yesterday.  Volume excess mild-moderate 2. Dialysis access - AVF @ [facility] placed 01/28/14 by Dr. Darrick PennaFields, functioned poorly, revision as outpatient; using R IJ catheter placed by Dr. Hart RochesterLawson 3/26.   3. Perforated pyloric ulcer / necrotic bowel - s/p repair of perforated ulcer & partial ileum resection 3/30, on  Zosyn. 4. Recurrent sepsis - with percutaneous drain (placed 4/8) of pelvic fluid. 5. C diff colitis - on PO Vancomycin. 6. 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 7. Hypotension/Volume - on Midodrine, severe R heart failure per TEE. 8. Anemia - Hgb 9.3, s/p 2 U PRBCs 4/13, on Aranesp 150 mcg on Mon; Fe 5% on 3/18. 9. Sec HPT - Ca 7.9 9.8 corrected), P 7.9, iPTH 338. 10. Nutrition - Alb 1.6, thin carb-mod diet. 11. PSVT - per Cardiology   Plan - HD tomorrow, cont to lower volume.       LOS: 34 days   [name],[name] 07/21/2014,9:07 AM  Pt seen, examined, agree w assess/plan as above with additions as indicated.  Vinson Moselleob Aashvi Rezabek MD pager [phone]    cell [phone] 07/21/2014, 12:10 PM

## 2014-07-21 NOTE — Progress Notes (Signed)
[name], female   DOB: 07/15/1955, 59 y.o.   MRN: [id number]     [facility]      [address] [place]   [place], [place] [address]    Phone: [phone] FAX: [phone]     Subjective: Tolerating POs.  Having BMs.  Buttock drain pulled out, nothing to do, will hold on replacing until a repeat CT scan.    Objective:  Vital signs:  Filed Vitals:   07/20/14 1754 07/20/14 2036 07/21/14 0425 07/21/14 0430  BP: 102/82 82/49  89/57  Pulse: 142 136  102  Temp:  97.7 F (36.5 C)  98 F (36.7 C)  TempSrc:  Oral  Oral  Resp:  19  16  Height:  5\' 6"  (1.676 m)    Weight:  96.4 kg (212 lb 8.4 oz) 96.4 kg (212 lb 8.4 oz)   SpO2:  89%  94%    Last BM Date: 07/20/14  Intake/Output   Yesterday:  04/18 0701 - 04/19 0700 In: 240 [P.O.:240] Out: 3339 [Urine:1; Drains:35; Stool:3] This shift:    I/O last 3 completed shifts: In: 240 [P.O.:240] Out: 3339 [Urine:1; Drains:35; Other:3300; Stool:3]    Physical Exam: General: Pt awake/alert/oriented x4 in no acute distress Abdomen: Soft. Nondistended. Midline wound--dry and intact. Right abd drain with serosanguinous output. Right buttock drainage, pulled out. No evidence of peritonitis. No incarcerated hernias.    Problem List:   Principal Problem:   Acute respiratory failure with hypoxia Active Problems:   Cerebral thrombosis with cerebral infarction   Hypertension   ESRD needing dialysis   Obesity (BMI 30-39.9)   Depression   Left renal mass   Chronic diastolic heart failure   H1N1 influenza   Tobacco use disorder   Renal failure (ARF), acute on chronic   Hyperlipidemia   PSVT (paroxysmal supraventricular tachycardia)   Hypoxia   SOB (shortness of breath)   Perforated gastric ulcer   Pelvic fluid collection   Abdominal abscess   Enteritis due to Clostridium difficile    Results:   Labs: Results for orders placed or performed during the hospital  encounter of 06/17/14 (from the past 48 hour(s))  Glucose, capillary     Status: Abnormal   Collection Time: 07/19/14 11:20 AM  Result Value Ref Range   Glucose-Capillary 135 (H) 70 - 99 mg/dL   Comment 1 Notify RN    Comment 2 Document in Chart   Glucose, capillary     Status: Abnormal   Collection Time: 07/19/14  4:48 PM  Result Value Ref Range   Glucose-Capillary 148 (H) 70 - 99 mg/dL   Comment 1 Notify RN    Comment 2 Document in Chart   Glucose, capillary     Status: None   Collection Time: 07/19/14  9:50 PM  Result Value Ref Range   Glucose-Capillary 71 70 - 99 mg/dL  Glucose, capillary     Status: Abnormal   Collection Time: 07/20/14  4:54 AM  Result Value Ref Range   Glucose-Capillary 59 (L) 70 - 99 mg/dL  Glucose, capillary     Status: Abnormal   Collection Time: 07/20/14  7:38 AM  Result Value Ref Range   Glucose-Capillary 53 (L) 70 - 99 mg/dL  Glucose, capillary     Status: None   Collection Time: 07/20/14  8:52 AM  Result Value Ref Range   Glucose-Capillary 72 70 - 99 mg/dL  CBC     Status: Abnormal  Collection Time: 07/20/14 10:40 AM  Result Value Ref Range   WBC 10.0 4.0 - 10.5 K/uL   RBC 3.29 (L) 3.87 - 5.11 MIL/uL   Hemoglobin 9.2 (L) 12.0 - 15.0 g/dL   HCT 28.7 (L) 36.0 - 46.0 %   MCV 87.2 78.0 - 100.0 fL   MCH 28.0 26.0 - 34.0 pg   MCHC 32.1 30.0 - 36.0 g/dL   RDW 22.0 (H) 11.5 - 15.5 %   Platelets 196 150 - 400 K/uL    Comment: REPEATED TO VERIFY  Renal function panel     Status: Abnormal   Collection Time: 07/20/14 10:40 AM  Result Value Ref Range   Sodium 134 (L) 135 - 145 mmol/L   Potassium 4.1 3.5 - 5.1 mmol/L   Chloride 96 96 - 112 mmol/L   CO2 20 19 - 32 mmol/L   Glucose, Bld 116 (H) 70 - 99 mg/dL   BUN 65 (H) 6 - 23 mg/dL   Creatinine, Ser 6.48 (H) 0.50 - 1.10 mg/dL   Calcium 7.9 (L) 8.4 - 10.5 mg/dL   Phosphorus 7.9 (H) 2.3 - 4.6 mg/dL   Albumin 1.6 (L) 3.5 - 5.2 g/dL   GFR calc non Af Amer 6 (L) >90 mL/min   GFR calc Af Amer 7 (L)  >90 mL/min    Comment: (NOTE) The eGFR has been calculated using the CKD EPI equation. This calculation has not been validated in all clinical situations. eGFR's persistently <90 mL/min signify possible Chronic Kidney Disease.    Anion gap 18 (H) 5 - 15  Glucose, capillary     Status: None   Collection Time: 07/20/14 11:59 AM  Result Value Ref Range   Glucose-Capillary 98 70 - 99 mg/dL  Glucose, capillary     Status: None   Collection Time: 07/20/14  6:26 PM  Result Value Ref Range   Glucose-Capillary 74 70 - 99 mg/dL  Hepatitis B surface antigen     Status: None   Collection Time: 07/20/14  8:22 PM  Result Value Ref Range   Hepatitis B Surface Ag NEGATIVE NEGATIVE    Comment: Performed at [facility]  CBC     Status: Abnormal   Collection Time: 07/21/14  5:08 AM  Result Value Ref Range   WBC 9.3 4.0 - 10.5 K/uL   RBC 3.31 (L) 3.87 - 5.11 MIL/uL   Hemoglobin 9.3 (L) 12.0 - 15.0 g/dL   HCT 29.2 (L) 36.0 - 46.0 %   MCV 88.2 78.0 - 100.0 fL   MCH 28.1 26.0 - 34.0 pg   MCHC 31.8 30.0 - 36.0 g/dL   RDW 22.3 (H) 11.5 - 15.5 %   Platelets 225 150 - 400 K/uL  Renal function panel     Status: Abnormal   Collection Time: 07/21/14  5:08 AM  Result Value Ref Range   Sodium 138 135 - 145 mmol/L   Potassium 3.7 3.5 - 5.1 mmol/L   Chloride 98 96 - 112 mmol/L   CO2 25 19 - 32 mmol/L   Glucose, Bld 94 70 - 99 mg/dL   BUN 26 (H) 6 - 23 mg/dL    Comment: DELTA CHECK NOTED   Creatinine, Ser 3.89 (H) 0.50 - 1.10 mg/dL    Comment: DELTA CHECK NOTED   Calcium 7.8 (L) 8.4 - 10.5 mg/dL   Phosphorus 4.6 2.3 - 4.6 mg/dL   Albumin 1.7 (L) 3.5 - 5.2 g/dL   GFR calc non Af Amer 12 (L) >90  mL/min   GFR calc Af Amer 14 (L) >90 mL/min    Comment: (NOTE) The eGFR has been calculated using the CKD EPI equation. This calculation has not been validated in all clinical situations. eGFR's persistently <90 mL/min signify possible Chronic Kidney Disease.    Anion gap 15 5 - 15  Glucose,  capillary     Status: None   Collection Time: 07/21/14  7:47 AM  Result Value Ref Range   Glucose-Capillary 98 70 - 99 mg/dL    Imaging / Studies: No results found.  Medications / Allergies:  Scheduled Meds: . darbepoetin (ARANESP) injection - DIALYSIS  150 mcg Intravenous Q Mon-HD  . feeding supplement (NEPRO CARB STEADY)  237 mL Oral BID BM  . feeding supplement (RESOURCE BREEZE)  1 Container Oral Q1500  . insulin aspart  0-9 Units Subcutaneous TID WC  . ipratropium  0.5 mg Nebulization TID  . levalbuterol  0.63 mg Nebulization TID  . metoprolol tartrate  25 mg Oral BID  . midodrine  10 mg Oral BID WC  . piperacillin-tazobactam (ZOSYN)  IV  2.25 g Intravenous 3 times per day  . sodium chloride  10-40 mL Intracatheter Q12H  . vancomycin  125 mg Oral 4 times per day   Continuous Infusions:  PRN Meds:.sodium chloride, sodium chloride, sodium chloride, sodium chloride, sodium chloride, sodium chloride, anticoagulant sodium citrate, bisacodyl, feeding supplement (NEPRO CARB STEADY), feeding supplement (NEPRO CARB STEADY), fentaNYL (SUBLIMAZE) injection, heparin, heparin, HYDROcodone-acetaminophen, lidocaine (PF), lidocaine (PF), lidocaine-prilocaine, lidocaine-prilocaine, metoprolol, morphine injection, ondansetron (ZOFRAN) IV, pentafluoroprop-tetrafluoroeth, pentafluoroprop-tetrafluoroeth, sodium chloride  Antibiotics: Anti-infectives    Start     Dose/Rate Route Frequency Ordered Stop   07/18/14 1800  vancomycin (VANCOCIN) 50 mg/mL oral solution 125 mg     125 mg Oral 4 times per day 07/18/14 1455     07/17/14 1830  vancomycin (VANCOCIN) IVPB 750 mg/150 ml premix     750 mg 150 mL/hr over 60 Minutes Intravenous  Once 07/17/14 1818 07/18/14 0024   07/15/14 1400  metroNIDAZOLE (FLAGYL) tablet 500 mg  Status:  Discontinued     500 mg Oral 3 times per day 07/15/14 1202 07/18/14 1533   07/13/14 2200  piperacillin-tazobactam (ZOSYN) IVPB 2.25 g     2.25 g 100 mL/hr over 30 Minutes  Intravenous 3 times per day 07/13/14 1318     07/13/14 2000  fluconazole (DIFLUCAN) IVPB 200 mg  Status:  Discontinued     200 mg 100 mL/hr over 60 Minutes Intravenous Every 24 hours 07/13/14 1318 07/19/14 1027   07/07/14 1200  vancomycin (VANCOCIN) IVPB 1000 mg/200 mL premix  Status:  Discontinued     1,000 mg 200 mL/hr over 60 Minutes Intravenous Every 24 hours 07/07/14 0937 07/14/14 1712   07/06/14 2000  fluconazole (DIFLUCAN) IVPB 400 mg  Status:  Discontinued     400 mg 100 mL/hr over 120 Minutes Intravenous Every 24 hours 07/06/14 1507 07/13/14 1318   07/06/14 1800  piperacillin-tazobactam (ZOSYN) IVPB 2.25 g  Status:  Discontinued     2.25 g 100 mL/hr over 30 Minutes Intravenous 4 times per day 07/06/14 1505 07/13/14 1318   07/05/14 2000  fluconazole (DIFLUCAN) IVPB 200 mg  Status:  Discontinued     200 mg 100 mL/hr over 60 Minutes Intravenous Every 24 hours 07/05/14 0757 07/06/14 1507   07/05/14 1400  piperacillin-tazobactam (ZOSYN) IVPB 2.25 g  Status:  Discontinued     2.25 g 100 mL/hr over 30 Minutes Intravenous 3 times per  day 07/05/14 0749 07/06/14 1505   07/02/14 1800  vancomycin (VANCOCIN) IVPB 1000 mg/200 mL premix  Status:  Discontinued     1,000 mg 200 mL/hr over 60 Minutes Intravenous Every 24 hours 07/01/14 1858 07/05/14 0801   07/01/14 2200  piperacillin-tazobactam (ZOSYN) IVPB 3.375 g  Status:  Discontinued     3.375 g 100 mL/hr over 30 Minutes Intravenous 4 times per day 07/01/14 1858 07/05/14 0749   07/01/14 2000  fluconazole (DIFLUCAN) IVPB 400 mg  Status:  Discontinued     400 mg 100 mL/hr over 120 Minutes Intravenous Every 24 hours 07/01/14 1858 07/05/14 0757   07/01/14 1400  fluconazole (DIFLUCAN) IVPB 200 mg  Status:  Discontinued     200 mg 100 mL/hr over 60 Minutes Intravenous Every 24 hours 07/01/14 1330 07/01/14 1853   07/01/14 1000  piperacillin-tazobactam (ZOSYN) IVPB 2.25 g  Status:  Discontinued     2.25 g 100 mL/hr over 30 Minutes Intravenous  Every 8 hours 07/01/14 0952 07/01/14 1853   07/01/14 1000  vancomycin (VANCOCIN) 500 mg in sodium chloride 0.9 % 100 mL IVPB     500 mg 100 mL/hr over 60 Minutes Intravenous  Once 07/01/14 0952 07/01/14 1126   06/30/14 1200  vancomycin (VANCOCIN) IVPB 1000 mg/200 mL premix     1,000 mg 200 mL/hr over 60 Minutes Intravenous  Once 06/30/14 0944 06/30/14 1403   06/30/14 1200  ceFEPIme (MAXIPIME) 2 g in dextrose 5 % 50 mL IVPB     2 g 100 mL/hr over 30 Minutes Intravenous  Once 06/30/14 0944 06/30/14 1425   06/30/14 0100  vancomycin (VANCOCIN) 2,000 mg in sodium chloride 0.9 % 500 mL IVPB     2,000 mg 250 mL/hr over 120 Minutes Intravenous  Once 06/30/14 0048 06/30/14 0525   06/30/14 0100  ceFEPIme (MAXIPIME) 2 g in dextrose 5 % 50 mL IVPB     2 g 100 mL/hr over 30 Minutes Intravenous  Once 06/30/14 0048 06/30/14 0322   06/27/14 0600  cefUROXime (ZINACEF) 1.5 g in dextrose 5 % 50 mL IVPB     1.5 g 100 mL/hr over 30 Minutes Intravenous On call to O.R. 06/26/14 1019 06/27/14 0630   06/19/14 1800  oseltamivir (TAMIFLU) capsule 30 mg     30 mg Oral Every M-W-F (1800) 06/19/14 1003 06/22/14 1841   06/18/14 1600  oseltamivir (TAMIFLU) capsule 30 mg  Status:  Discontinued     30 mg Oral Daily 06/18/14 1538 06/19/14 1003      Assessment/Plan POD #19 s/p EXPLORATORY LAPAROTOMY WITH CLOSURE OF PERFORATED PYLORIC ULCER, SMALL BOWEL RESECTION - 07/01/2014 - Dalbert Batman Post-operative ileus Intra-abdominal abscess Cefepime 3/29--->3/30 Zosyn 3/30---> Fluconazole 3/30--->4/17 -On soft diet -Open wound, BID wet to dry dressing changes -Needs to mobilize to aid in recovery -Use IS -repeat CT abdomen 4/14--->decreased abscess size. IR did not think drain needs to be repositioned -right abdomen drain minimal serosanguinous -right buttock drain, pulled out, will await CT before we replace.  IR aware -Cx negative to date -repeat CT later on thursday Leukocytosis -WBC normal 4/19 Anemia - 9.1 -  improved ESRD - on HD now PCM/TPN -Tolerating POs.  Stroke - 06/19/2014 C Diff colitis-PO Vanc dCHF DM PNA left hemiplegia - this will probably limit her mobility and return to normal activities Activity - PT following    Erby Pian, [facility] Pager [phone](7A-4:30P) For consults and floor pages call [phone](7A-4:30P)  07/21/2014 9:48 AM

## 2014-07-21 NOTE — Progress Notes (Signed)
Physical Therapy Treatment Patient Details Name: [name] MRN: [id number] DOB: [birth year] Today's Date: 07/21/2014    History of Present Illness [name] is a 59 y.o. female with a history of CKD and hypertension [facility]16 complaining of shortness of breath and generalized weakness x 2 weeks. Admitted with acute hypoxemic respiratory failure. During hospital stay noted to have weakness of her left side. 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  She developed septic shock 4/1 with pressors weaned off 4/3.  Began CRRT and pressors restarted 4/4; Developed pelvic abcess 4/7 with pelvic drain placed 4/8.  weaned off pressors 4/9.  Runs of SVT 4/5 and 4/11    PT Comments    Hesitant to participate and fearful of falling initially, but willing to work; Continues to present with L sided hemiplegia associated with the CVA, but noted also general deconditioning compared to last session, likely due to effects of bedrest; Recommend pt be OOB to chair at least 1X per day (using 2 person assist or lift equipment)  Follow Up Recommendations  LTACH;Supervision/Assistance - 24 hour     Equipment Recommendations  Other (comment) (TBA)    Recommendations for Other Services       Precautions / Restrictions Precautions Precautions: Fall Precaution Comments: Jp drain,  Restrictions Weight Bearing Restrictions: No    Mobility  Bed Mobility Overal bed mobility: Needs Assistance;+2 for physical assistance Bed Mobility: Supine to Sit Rolling: Max assist   Supine to sit: +2 for physical assistance;Mod assist     General bed mobility comments: Pt using bed rail and instruction to rotate neck; Support given at shoulder and pelvic girdles in a force couple to assist pt coming from sidelie to sit; used bed pad t guide hips  Transfers Overall  transfer level: Needs assistance Equipment used:  (bed pad to cradle hips, gait belt) Transfers: Sit to/from BJ'sStand;Stand Pivot Transfers Sit to Stand: +2 physical assistance;Max assist Stand pivot transfers: Max assist;+2 physical assistance       General transfer comment: Pt requires bil LE blocked and anterior tilt to complete sit<>Stand; Max assist for weight shifting to make transition to chair; L knee blocked for safety; decr control with stand to sit  Ambulation/Gait                 Stairs            Wheelchair Mobility    Modified Rankin (Stroke Patients Only)       Balance   Sitting-balance support: Single extremity supported;Feet supported Sitting balance-Leahy Scale: Fair                              Cognition Arousal/Alertness: Awake/alert Behavior During Therapy: WFL for tasks assessed/performed Overall Cognitive Status: Within Functional Limits for tasks assessed                 General Comments: flat affect and required a little encouragement due to reporting "i dont feel well" Pt participating with therapy    Exercises General Exercises - Upper Extremity Shoulder Flexion: AROM;10 reps;Both;Strengthening;Supine Elbow Flexion: AROM;Both;10 reps;Supine Digit Composite Flexion: AROM;Left;15 reps;Supine General Exercises - Lower Extremity Hip ABduction/ADduction: AAROM;Both;10 reps (isometrically)    General Comments        Pertinent Vitals/Pain Pain Assessment: Faces Faces  Pain Scale: Hurts even more Pain Location: Stomach pain with reaching Pain Descriptors / Indicators: Aching Pain Intervention(s): Monitored during session;Repositioned    Home Living                      Prior Function            PT Goals (current goals can now be found in the care plan section) Acute Rehab PT Goals Patient Stated Goal: to get better  PT Goal Formulation: With patient Time For Goal Achievement: 07/29/14 Potential to  Achieve Goals: Good Progress towards PT goals: Progressing toward goals    Frequency  Min 3X/week    PT Plan Current plan remains appropriate    Co-evaluation PT/OT/SLP Co-Evaluation/Treatment: Yes Reason for Co-Treatment: Complexity of the patient's impairments (multi-system involvement);Necessary to address cognition/behavior during functional activity;For patient/therapist safety PT goals addressed during session: Mobility/safety with mobility;Balance OT goals addressed during session: ADL's and self-care     End of Session Equipment Utilized During Treatment: Gait belt Activity Tolerance: Patient tolerated treatment well Patient left: in chair;with call bell/phone within reach;with nursing/sitter in room;with family/visitor present     Time: 1610-9604 PT Time Calculation (min) (ACUTE ONLY): 42 min  Charges:  $Therapeutic Activity: 23-37 mins                    G CodesOlen Pel 07/21/2014, 4:08 PM  Van Clines, [place]  Acute Rehabilitation Services Pager [phone] Office [phone]

## 2014-07-21 NOTE — Progress Notes (Signed)
Occupational Therapy Treatment Patient Details Name: Nanami C Teegarden MRN: [id number] DOB: Aug 15, 1955 Today's Date: 07/21/2014    History of present illness Madelon Mikeal HawthorneC Setters is a 59 y.o. female with a history of CKD and hypertension [facility] complaining of shortness of breath and generalized weakness x 2 weeks. Admitted with acute hypoxemic respiratory failure. During hospital stay noted to have weakness of her left side. 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  She developed septic shock 4/1 with pressors weaned off 4/3.  Began CRRT and pressors restarted 4/4; Developed pelvic abcess 4/7 with pelvic drain placed 4/8.  weaned off pressors 4/9.  Runs of SVT 4/5 and 4/11   OT comments   Pt progressed from supine to chair level sitting. Pt noted to have skin break down on L side of back and pink wound dressing placed this session. Pt tolerated EOB ~8 minutes min guard (A). Pt deconditioned and could benefit from OOB x1 every day with staff.   Follow Up Recommendations  LTACH    Equipment Recommendations  None recommended by OT    Recommendations for Other Services      Precautions / Restrictions Precautions Precautions: Fall Precaution Comments: Jp drain,        Mobility Bed Mobility Overal bed mobility: Needs Assistance;+2 for physical assistance Bed Mobility: Supine to Sit Rolling: Max assist   Supine to sit: +2 for physical assistance;Mod assist     General bed mobility comments: Pt using bed rail and instruction to rotate neck  Transfers Overall transfer level: Needs assistance   Transfers: Stand Pivot Transfers Sit to Stand: +2 physical assistance;Max assist         General transfer comment: Pt requires bil LE blocked and anterior tilt to complete sit<>Stand    Balance   Sitting-balance support: Single extremity  supported;Feet supported Sitting balance-Leahy Scale: Fair                             ADL Overall ADL's : Needs assistance/impaired   Eating/Feeding Details (indicate cue type and reason): reports not feeling well and not eating lunch currently                     Toilet Transfer: +2 for physical assistance;Maximal assistance;Stand-pivot Toilet Transfer Details (indicate cue type and reason): transfer to the R with BIL LE blocked Toileting- Clothing Manipulation and Hygiene: Total assistance;+2 for physical assistance         General ADL Comments: Pt supine on arrival and agreeable to UE exercises and transfer to chair after encouragement. pt fearful of eob sitting initially. Pt building confidence but fatigue in back reported. Pt eob sitting min guard (A) .       Vision                     Perception     Praxis      Cognition   Behavior During Therapy: [facility] for tasks assessed/performed Overall Cognitive Status: Within Functional Limits for tasks assessed                  General Comments: flat affect and required a little encouragement due to reporting "i dont feel well" Pt participating with therapy    Extremity/Trunk Assessment  Exercises General Exercises - Upper Extremity Shoulder Flexion: AROM;10 reps;Both;Strengthening;Supine Elbow Flexion: AROM;Both;10 reps;Supine Digit Composite Flexion: AROM;Left;15 reps;Supine   Shoulder Instructions       General Comments      Pertinent Vitals/ Pain       Pain Assessment: 0-10 Faces Pain Scale: Hurts even more Pain Location: stomach with reaching, L UE / LE ach Pain Descriptors / Indicators: Aching Pain Intervention(s): Monitored during session;Premedicated before session;Repositioned  Home Living                                          Prior Functioning/Environment              Frequency Min 2X/week (could benefit from MORE)      Progress Toward Goals  OT Goals(current goals can now be found in the care plan section)  Progress towards OT goals: Progressing toward goals  Acute Rehab OT Goals Patient Stated Goal: to get better  OT Goal Formulation: With patient Time For Goal Achievement: 07/29/14 Potential to Achieve Goals: Good ADL Goals Pt Will Perform Grooming: with min guard assist Pt Will Perform Upper Body Bathing: with min assist;sitting Pt Will Perform Upper Body Dressing: with min assist;sitting Pt Will Transfer to Toilet: with mod assist;stand pivot transfer;bedside commode Pt/caregiver will Perform Home Exercise Program: Increased ROM;Left upper extremity;With written HEP provided;With Supervision Additional ADL Goal #1: Pt will maintain EOB sitting with min guard assist while performing simple ADL task Additional ADL Goal #2: Pt will use Lt UE as a gross assist consistently during ADLs  Additional ADL Goal #3: Pt will move to EOB with mod A +1 in prep for ADLs  Additional ADL Goal #4: Pt will participate in further visual assessment   Plan Discharge plan remains appropriate    Co-evaluation    PT/OT/SLP Co-Evaluation/Treatment: Yes Reason for Co-Treatment: Complexity of the patient's impairments (multi-system involvement);Necessary to address cognition/behavior during functional activity;For patient/therapist safety   OT goals addressed during session: ADL's and self-care      End of Session Equipment Utilized During Treatment: Oxygen;Gait belt   Activity Tolerance Patient tolerated treatment well   Patient Left in chair;with call bell/phone within reach;with chair alarm set   Nurse Communication Mobility status;Precautions        Time: 0454-0981 OT Time Calculation (min): 38 min  Charges: OT General Charges $OT Visit: 1 Procedure OT Treatments $Therapeutic Activity: 8-22 mins  Boone Master B 07/21/2014, 2:39 PM  Pager: [phone]

## 2014-07-21 NOTE — Progress Notes (Signed)
Progress Note [facility].   Katheen C [name] ZOX:[id number] DOB: 1955/08/29 DOA: 06/17/2014 PCP: [name], MD  Admit HPI / Brief Narrative: 59 yo female smoker with hx HTN, ESRD on HD (just began this admit), COPD, failed L AV fistula, initially admitted 3/16 with flu and acute hypoxic resp failure. Ultimately tx to [facility] due to need for HD. 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   Patient on 3/31 was taking to the OR for ex lap for per pyloric ulcer and sm bowel resection. Patient develops septic shock on 4-1, she was started on pressors and wean off pressors on 4-3. Subsequently she was diagnosed with pelvic abscess by CT scan perform on 4-7. She underwent pelvic drain placement by IR. Patient also develops C. diff diagnosed 4-12.  C diff was not responding flagyl, and she was started on vancomycin 4-16. She also develops  run of SVT. Cardiology has been helping with management.    HPI/Subjective: No significant abdominal pain. Diarrhea improving.   Assessment/Plan:  C. difficile colitis C diff positive on 4-12.  Received 4 flagyl without improvement.  Continue with  Vancomycin, oral. Day 4  Acute hypoxic respiratory failure secondary to H1 N1 influenza +/- HCAP Stable.   PSVT Has been evaluated by EP - TSH is normal - Cardiology following - follow K+ and Mg  Continue with metoprolol , cardiology adjusting medications.   Small PFO Has been evaluated by Cards  ACA CVA with Lt sided weakness Will need ongoing long term rehab - PT/OT to cont to follow   ESRD new start HD this admit  Ongoing hemodialysis per Nephrology - permcath placed 3/26 d/t AVF problems; when stable [facility]VS wants to considering revision of AVF Electrolytes abnormalities corrected with dialysis. Hyponatremia, acidosis.   Status post exploratory laparotomy with closure of perforated pyloric ulcer / small bowel resection w/ postop ileus General Surgery continues to follow and is advancing  diet - now off TNA and on regular diet  Pelvic abscess status post percutaneous drain placement 4/8 Ongoing care per general surgery and IR Day 21 zosyn.  Received  18 days of vancomycin and  18 days  of fluconazole.  Body fluid grew lactobacillus species.  Plan to repeat ct scan this week.   Chronic diastolic congestive heart failure w/ Severe RHF EF 60-65%, grade 1 diastolic dysfunction. Appears compensated. Fluids manage with dialysis.   Anemia of critical illness and chronic disease Hgb presently stable s/p 2U PRBC 4/13 - follow  HB stable.   Thrombocytopenia Serotonin assay negative - plt count now climbing again - follow trend - no evidence of blood loss at present  Platelet increasing.   COPD Stable at present  Diabetes mellitus 2 CBG reasonably controlled - cont to follow  Hypoglycemia this am. Not on long acting insulin.   Obesity - Body mass index is 34.32 kg/(m^2).  Code Status: FULL Family Communication: no family present at time of exam Disposition Plan:  continue PT/OT - will need long-term rehabilitation stay in hemodialysis capable nursing home or LTAC   Consultants: Cardiology Nephrology PCCM Gen Surgery   Significant Events: 3/16 influenza A+ 3/18 R ACA stroke 3/19 MRI multifocal acute infarction of both hemispheres, large right distal ACA 3/22 TEE severe RAE, severely reduced RV function, severe TR, no LAA thrombus, small PFO on TEE 3/31 to OR ex lap for per pyloric ulcer and sm bowel resection 4/1 septic shock on pressors 4/3 pressors weaned off  4/4 restart pressors, CRRT 4/6 levo switched to neo 4/7 pelvic abscess on CT 4/8 IR placed pelvic drain 4/9 off pressors 4/11 long run of SVT.  Antibiotics: Zosyn > Oral Flagyl 4/13 >  DVT prophylaxis: SCDs  Objective: Blood pressure 99/71, pulse 103, temperature 98 F (36.7 C), temperature source Oral, resp. rate 18, height  (1.676 m), weight 96.4 kg (212 lb 8.4 oz), SpO2 97  %.  Intake/Output Summary (Last 24 hours) at 07/21/14 1734 Last data filed at 07/21/14 0900  Gross per 24 hour  Intake    120 ml  Output     37 ml  Net     83 ml   Exam: General: No acute respiratory distress - alert and conversant  Lungs: bilateral ronchus.  Cardiovascular: Regular rate and rhythm without murmur gallop rub Abdomen: Abdominal wound is dressed and dry, abdomen nondistended, , drain intact  Extremities: No significant cyanosis, clubbing, edema bilateral lower extremities  Data Reviewed: Basic Metabolic Panel:  Recent Labs Lab 07/15/14 0440 07/16/14 0440 07/17/14 0410 07/19/14 0535 07/20/14 1040 07/21/14 0508  NA 125* 128* 126* 131* 134* 138  K 3.9 3.5 5.3* 3.9 4.1 3.7  CL 93* 96 94* 95* 96 98  CO2 18* 21 16* GLUCOSE 126* 139* 70 96 116* 94  BUN 81* 68* 86* 57* 65* 26*  CREATININE 3.60* 3.53* 4.93* 5.17* 6.48* 3.89*  CALCIUM 7.9* 7.7* 7.8* 7.6* 7.9* 7.8*  MG 1.8 1.7 1.9  --   --   --   PHOS 3.7 3.9  --   --  7.9* 4.6    Liver Function Tests:  Recent Labs Lab 07/15/14 0440 07/16/14 0440 07/17/14 0410 07/20/14 1040 07/21/14 0508  AST  --  21 21  --   --   ALT  --  43* 37*  --   --   ALKPHOS  --  108 119*  --   --   BILITOT  --  1.8* 1.7*  --   --   PROT  --  5.3* 5.9*  --   --   ALBUMIN 1.6* 1.6* 1.6* 1.6* 1.7*   CBC:  Recent Labs Lab 07/15/14 0440 07/16/14 0440 07/17/14 0410 07/19/14 0535 07/20/14 1040 07/21/14 0508  WBC 13.4* 12.2* 10.6* 10.1 10.0 9.3  NEUTROABS 10.7*  --   --   --   --   --   HGB 7.1* 8.8* 9.6* 9.1* 9.2* 9.3*  HCT 22.3* 27.1* 29.2* 27.9* 28.7* 29.2*  MCV 87.8 86.3 84.9 86.4 87.2 88.2  PLT 79* 67* 79* 136* 196 225    CBG:  Recent Labs Lab 07/20/14 1159 07/20/14 1826 07/21/14 0747 07/21/14 1150 07/21/14 1710  GLUCAP 98 74 98 101* 80    Recent Results (from the past 240 hour(s))  Culture, blood (routine x 2)     Status: None   Collection Time: 07/13/14  3:45 PM  Result Value Ref Range Status    Specimen Description BLOOD RIGHT HAND  Final   Special Requests BOTTLES DRAWN AEROBIC ONLY 3CC  Final   Culture   Final    NO GROWTH 5 DAYS Performed at [facility]    Report Status 07/20/2014 FINAL  Final  Culture, blood (routine x 2)     Status: None   Collection Time: 07/13/14  4:00 PM  Result Value Ref Range Status   Specimen Description BLOOD RIGHT HAND  Final   Special Requests BOTTLES DRAWN AEROBIC ONLY 2CC  Final  Culture   Final    NO GROWTH 5 DAYS Note: Culture results may be compromised due to an inadequate volume of blood received in culture bottles. Performed at [facility]    Report Status 07/20/2014 FINAL  Final  Clostridium Difficile by PCR     Status: Abnormal   Collection Time: 07/14/14 10:15 PM  Result Value Ref Range Status   C difficile by pcr POSITIVE (A) NEGATIVE Final    Comment: CRITICAL RESULT CALLED TO, READ BACK BY AND VERIFIED WITH: Candy SledgeE. SMITH RN 9:45 07/15/14 ([email or web address])      Studies:   Recent x-ray studies have been reviewed in detail by the Attending Physician  Scheduled Meds:  Scheduled Meds: . amiodarone  200 mg Oral BID  . darbepoetin (ARANESP) injection - DIALYSIS  150 mcg Intravenous Q Mon-HD  . feeding supplement (NEPRO CARB STEADY)  237 mL Oral BID BM  . feeding supplement (RESOURCE BREEZE)  1 Container Oral Q1500  . insulin aspart  0-9 Units Subcutaneous TID WC  . ipratropium  0.5 mg Nebulization TID  . levalbuterol  0.63 mg Nebulization TID  . metoprolol tartrate  25 mg Oral BID  . midodrine  10 mg Oral BID WC  . piperacillin-tazobactam (ZOSYN)  IV  2.25 g Intravenous 3 times per day  . sodium chloride  10-40 mL Intracatheter Q12H  . vancomycin  125 mg Oral 4 times per day    Time spent on care of this patient: 35 mins   Alba Coryegalado, Crystle Carelli A , MD  [phone] [facility] Office  [phone] Pager - Text Page per Amion as per below:  On-Call/Text Page:      [email or web address]      password [email or web address]  If  7PM-7AM, please contact night-coverage [email or web address] Password [email or web address] 07/21/2014, 5:34 PM   LOS: 34 days

## 2014-07-21 NOTE — Progress Notes (Signed)
Subjective: No dizziness, CP, orthopnea  Objective: Vital signs in last 24 hours: Temp:  [97.1 F (36.2 C)-98.4 F (36.9 C)] 98 F (36.7 C) (04/19 0430) Pulse Rate:  [67-142] 102 (04/19 0430) Resp:  [16-25] 16 (04/19 0430) BP: (82-119)/(22-85) 89/57 mmHg (04/19 0430) SpO2:  [89 %-98 %] 94 % (04/19 0430) FiO2 (%):  [28 %] 28 % (04/18 0934) Weight:  [212 lb 8.4 oz (96.4 kg)] 212 lb 8.4 oz (96.4 kg) (04/19 0425) Last BM Date: 07/20/14  Intake/Output from previous day: 04/18 0701 - 04/19 0700 In: 240 [P.O.:240] Out: 3339 [Urine:1; Drains:35; Stool:3] Intake/Output this shift:    Medications Current Facility-Administered Medications  Medication Dose Route Frequency Provider Last Rate Last Dose  . 0.9 %  sodium chloride infusion  100 mL Intravenous PRN Lauris PoagAlvin C Dutkiewicz, MD      . 0.9 %  sodium chloride infusion  100 mL Intravenous PRN Lauris PoagAlvin C Hjort, MD      . 0.9 %  sodium chloride infusion  100 mL Intravenous PRN Lauris PoagAlvin C Siverling, MD      . 0.9 %  sodium chloride infusion  100 mL Intravenous PRN Lauris PoagAlvin C Deyo, MD      . 0.9 %  sodium chloride infusion  100 mL Intravenous PRN Lauris PoagAlvin C Villaflor, MD      . 0.9 %  sodium chloride infusion  100 mL Intravenous PRN Lauris PoagAlvin C Latu, MD      . anticoagulant sodium citrate solution 5 mL  5 mL Intravenous PRN Annie SableKellie Goldsborough, MD   4.2 mL at 07/10/14 1441  . bisacodyl (DULCOLAX) suppository 10 mg  10 mg Rectal Daily PRN WES[employer] InternationalMegan N Dort, PA-C      . Darbepoetin Alfa (ARANESP) injection 150 mcg  150 mcg Intravenous Q Mon-HD Lauris PoagAlvin C Volden, MD   150 mcg at 07/20/14 1500  . feeding supplement (NEPRO CARB STEADY) liquid 237 mL  237 mL Oral PRN Lauris PoagAlvin C Roupp, MD      . feeding supplement (NEPRO CARB STEADY) liquid 237 mL  237 mL Oral PRN Lauris PoagAlvin C Lemler, MD      . feeding supplement (NEPRO CARB STEADY) liquid 237 mL  237 mL Oral BID BM Marijean NiemannStephanie La, RD   237 mL at 07/20/14 1400  . feeding supplement (RESOURCE BREEZE) (RESOURCE BREEZE) liquid 1  Container  1 Container Oral Q1500 Marijean NiemannStephanie La, RD   1 Container at 07/20/14 1500  . fentaNYL (SUBLIMAZE) injection 25-50 mcg  25-50 mcg Intravenous Q2H PRN Carolan ClinesNora M Sadek, MD   50 mcg at 07/20/14 2005  . heparin injection 1,000 Units  1,000 Units Dialysis PRN Lauris PoagAlvin C Takeda, MD      . heparin injection 2,000 Units  20 Units/kg Dialysis PRN Lauris PoagAlvin C Lees, MD      . HYDROcodone-acetaminophen (NORCO/VICODIN) 5-325 MG per tablet 1-2 tablet  1-2 tablet Oral Q4H PRN Senaida OresMegan N Dort, PA-C   1 tablet at 07/19/14 2145  . insulin aspart (novoLOG) injection 0-9 Units  0-9 Units Subcutaneous TID WC Armandina StammerNathan J Batchelder, RPH   1 Units at 07/19/14 1747  . ipratropium (ATROVENT) nebulizer solution 0.5 mg  0.5 mg Nebulization TID Lonia BloodJeffrey T McClung, MD   0.5 mg at 07/20/14 1959  . levalbuterol (XOPENEX) nebulizer solution 0.63 mg  0.63 mg Nebulization TID Lonia BloodJeffrey T McClung, MD   0.63 mg at 07/20/14 1953  . lidocaine (PF) (XYLOCAINE) 1 % injection 5 mL  5 mL Intradermal PRN Lauris PoagAlvin C Applin, MD      .  lidocaine (PF) (XYLOCAINE) 1 % injection 5 mL  5 mL Intradermal PRN Lauris Poag, MD      . lidocaine-prilocaine (EMLA) cream 1 application  1 application Topical PRN Lauris Poag, MD      . lidocaine-prilocaine (EMLA) cream 1 application  1 application Topical PRN Lauris Poag, MD      . metoprolol (LOPRESSOR) injection 2.5-5 mg  2.5-5 mg Intravenous Q3H PRN Oretha Milch, MD   5 mg at 07/20/14 1458  . metoprolol tartrate (LOPRESSOR) tablet 25 mg  25 mg Oral BID Lennette Bihari, MD   25 mg at 07/20/14 2207  . midodrine (PROAMATINE) tablet 10 mg  10 mg Oral BID WC Delano Metz, MD   10 mg at 07/20/14 1310  . morphine 2 MG/ML injection 1 mg  1 mg Intravenous Q4H PRN Rueben Bash Dort, PA-C   1 mg at 07/19/14 0316  . ondansetron (ZOFRAN) injection 4 mg  4 mg Intravenous Q6H PRN Lupita Leash, MD   4 mg at 07/06/14 2230  . pentafluoroprop-tetrafluoroeth (GEBAUERS) aerosol 1 application  1 application Topical PRN Lauris Poag, MD      . pentafluoroprop-tetrafluoroeth Peggye Pitt) aerosol 1 application  1 application Topical PRN Lauris Poag, MD      . piperacillin-tazobactam (ZOSYN) IVPB 2.25 g  2.25 g Intravenous 3 times per day Gardner Candle, RPH   2.25 g at 07/21/14 0620  . sodium chloride 0.9 % injection 10-40 mL  10-40 mL Intracatheter Q12H Belkys A Regalado, MD   10 mL at 07/20/14 2200  . sodium chloride 0.9 % injection 10-40 mL  10-40 mL Intracatheter PRN Belkys A Regalado, MD   20 mL at 07/21/14 0518  . vancomycin (VANCOCIN) 50 mg/mL oral solution 125 mg  125 mg Oral 4 times per day Alba Cory, MD   125 mg at 07/21/14 0620    PE: General appearance: alert, cooperative, no distress and resting comfortably, completely flat Lungs: mild wheeze. Heart: regular rate and rhythm and no MRG. Extremities: 1+ right LEE Pulses: 2+ and symmetric Skin: Warm and dry   Lab Results:   Recent Labs  07/19/14 0535 07/20/14 1040 07/21/14 0508  WBC 10.1 10.0 9.3  HGB 9.1* 9.2* 9.3*  HCT 27.9* 28.7* 29.2*  PLT 136* 196 225   BMET  Recent Labs  07/19/14 0535 07/20/14 1040 07/21/14 0508  NA 131* 134* 138  K 3.9 4.1 3.7  CL 95* 96 98  CO2 GLUCOSE 96 116* 94  BUN 57* 65* 26*  CREATININE 5.17* 6.48* 3.89*  CALCIUM 7.6* 7.9* 7.8*     Assessment/Plan  Principal Problem:   Acute respiratory failure with hypoxia Active Problems:   Cerebral thrombosis with cerebral infarction   Hypertension   ESRD needing dialysis   Obesity (BMI 30-39.9)   Depression   Left renal mass   Chronic diastolic heart failure   H1N1 influenza   Tobacco use disorder   Renal failure (ARF), acute on chronic   Hyperlipidemia   PSVT (paroxysmal supraventricular tachycardia)   Hypoxia   SOB (shortness of breath)   Perforated gastric ulcer   Pelvic fluid collection   Abdominal abscess   Enteritis due to Clostridium difficile  59 year old female with history of SVT, end-stage renal disease on  hemodialysis, hypertension, resolved acute hypoxemic respiratory failure secondary to the flu who was noted to have weakness on her left side during hospital stay with CT scan demonstrating  acute infarct of right anterior cerebral artery territory. Small PFO noted by transthoracic echo, no DVT/SVT by dopplers 06/18/14 . TEE 06/23/14: no LAA thrombus, severe RAE; mild RVE; severely reduced RV function; severe TR; bowing of atrial septum right to left; positive saline microcavitation study She was seen by Dr. Anne Fu on 06/24/14 and felt to have SVT or atrial tachycardia but no atrial fibrillation. She was seen by Dr. Graciela Husbands on 06/25/14 and he recommended 30 day event monitor at time of discharge. Since then she has had a prolonged hospitalization complicated by perforated pyloric ulcer. She has had episodes of recurrence of SVT.   1. PSVT:  She has been seen during this admission by our EP team. 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 Recent episodes have limited dialysis. TSH normal this admission.  SVT most likely partially driven by hyperadrenergic state associated with present illness. Telemetry reviewed; she continues to have SVT; hopefully will improve as present illness improves. Can add amiodarone if needed.  Lopressor increased to 25 bid yesterday.  Hypotensive this morning(0430hrs). Now on midodrine.  Maybe this will help slow her down as it can cause bradycardia.  Recheck.    SVT last night.   IV lopressor given and  PO given. Recurrent SVT 0900 am today. Now in NSR.  Bp will not tolerate further increase in lopressor.  Consider amio    2. ESRD: Per nephrology        LOS: 34 days    HAGER, BRYAN PA-C 07/21/2014   Patient seen and examined. Agree with assessment and plan. Recuurent tachycardic events as noted. Will initiate amiodarone 200 mg bid. F/U LFT's and TSH.   Lennette Bihari, MD, [facility] 07/21/2014 3:02 PM

## 2014-07-22 DIAGNOSIS — K651 Peritoneal abscess: Secondary | ICD-10-CM | POA: Insufficient documentation

## 2014-07-22 DIAGNOSIS — A047 Enterocolitis due to Clostridium difficile: Secondary | ICD-10-CM

## 2014-07-22 DIAGNOSIS — R7989 Other specified abnormal findings of blood chemistry: Secondary | ICD-10-CM | POA: Diagnosis not present

## 2014-07-22 LAB — BASIC METABOLIC PANEL
Anion gap: 17 — ABNORMAL HIGH (ref 5–15)
BUN: 34 mg/dL — AB (ref 6–23)
CALCIUM: 8 mg/dL — AB (ref 8.4–10.5)
CHLORIDE: 98 mmol/L (ref 96–112)
CO2: 22 mmol/L (ref 19–32)
CREATININE: 5.4 mg/dL — AB (ref 0.50–1.10)
GFR calc Af Amer: 9 mL/min — ABNORMAL LOW (ref >90)
GFR calc non Af Amer: 8 mL/min — ABNORMAL LOW (ref >90)
GLUCOSE: 138 mg/dL — AB (ref 70–99)
Potassium: 3.8 mmol/L (ref 3.5–5.1)
SODIUM: 137 mmol/L (ref 135–145)

## 2014-07-22 LAB — CBC
HCT: 28.4 % — ABNORMAL LOW (ref 36.0–46.0)
HEMOGLOBIN: 9.2 g/dL — AB (ref 12.0–15.0)
MCH: 28.4 pg (ref 26.0–34.0)
MCHC: 32.4 g/dL (ref 30.0–36.0)
MCV: 87.7 fL (ref 78.0–100.0)
Platelets: 264 10*3/uL (ref 150–400)
RBC: 3.24 MIL/uL — ABNORMAL LOW (ref 3.87–5.11)
RDW: 22.4 % — AB (ref 11.5–15.5)
WBC: 8.6 10*3/uL (ref 4.0–10.5)

## 2014-07-22 LAB — GLUCOSE, CAPILLARY
GLUCOSE-CAPILLARY: 120 mg/dL — AB (ref 70–99)
Glucose-Capillary: 123 mg/dL — ABNORMAL HIGH (ref 70–99)
Glucose-Capillary: 84 mg/dL (ref 70–99)
Glucose-Capillary: 94 mg/dL (ref 70–99)
Glucose-Capillary: 84 mg/dL (ref 70–99)

## 2014-07-22 LAB — HEPATIC FUNCTION PANEL
ALT: 23 U/L (ref 0–35)
AST: 25 U/L (ref 0–37)
Albumin: 1.7 g/dL — ABNORMAL LOW (ref 3.5–5.2)
Alkaline Phosphatase: 136 U/L — ABNORMAL HIGH (ref 39–117)
BILIRUBIN INDIRECT: 0.7 mg/dL (ref 0.3–0.9)
Bilirubin, Direct: 0.4 mg/dL (ref 0.0–0.5)
Total Bilirubin: 1.1 mg/dL (ref 0.3–1.2)
Total Protein: 6 g/dL (ref 6.0–8.3)

## 2014-07-22 LAB — TSH: TSH: 6.619 u[IU]/mL — ABNORMAL HIGH (ref 0.350–4.500)

## 2014-07-22 LAB — MAGNESIUM: Magnesium: 1.9 mg/dL (ref 1.5–2.5)

## 2014-07-22 MED ORDER — ZOLPIDEM TARTRATE 5 MG PO TABS
5.0000 mg | ORAL_TABLET | Freq: Once | ORAL | Status: AC
  Administered 2014-07-22: 5 mg via ORAL
  Filled 2014-07-22: qty 1

## 2014-07-22 MED ORDER — LIDOCAINE HCL (PF) 1 % IJ SOLN: 5.0000 mL | INTRAMUSCULAR | Status: DC | PRN

## 2014-07-22 MED ORDER — NEPRO/CARBSTEADY PO LIQD: 237.0000 mL | ORAL | Status: DC | PRN

## 2014-07-22 MED ORDER — MIDODRINE HCL 5 MG PO TABS
ORAL_TABLET | ORAL | Status: AC
  Filled 2014-07-22: qty 2

## 2014-07-22 MED ORDER — ALTEPLASE 2 MG IJ SOLR
2.0000 mg | Freq: Once | INTRAMUSCULAR | Status: AC | PRN
  Filled 2014-07-22: qty 2

## 2014-07-22 MED ORDER — LIDOCAINE-PRILOCAINE 2.5-2.5 % EX CREA: 1.0000 "application " | TOPICAL_CREAM | CUTANEOUS | Status: DC | PRN

## 2014-07-22 MED ORDER — PRO-STAT SUGAR FREE PO LIQD
30.0000 mL | Freq: Three times a day (TID) | ORAL | Status: DC
  Administered 2014-07-22 – 2014-07-28 (×6): 30 mL via ORAL
  Filled 2014-07-22 (×19): qty 30

## 2014-07-22 MED ORDER — PENTAFLUOROPROP-TETRAFLUOROETH EX AERO: 1.0000 "application " | INHALATION_SPRAY | CUTANEOUS | Status: DC | PRN

## 2014-07-22 MED ORDER — SODIUM CHLORIDE 0.9 % IV SOLN: 100.0000 mL | INTRAVENOUS | Status: DC | PRN

## 2014-07-22 MED ORDER — HEPARIN SODIUM (PORCINE) 1000 UNIT/ML DIALYSIS: 1000.0000 [IU] | INTRAMUSCULAR | Status: DC | PRN

## 2014-07-22 NOTE — Progress Notes (Signed)
Subjective:  Seen on dialysis, quiet, but no complaints  Objective: Vital signs in last 24 hours: Temp:  [97.4 F (36.3 C)-98.2 F (36.8 C)] 97.4 F (36.3 C) (04/20 0800) Pulse Rate:  [100-107] 101 (04/20 0830) Resp:  [15-25] 25 (04/20 0830) BP: (99-117)/(41-81) 106/56 mmHg (04/20 0830) SpO2:  [92 %-100 %] 98 % (04/20 0830) Weight:  [96.6 kg (212 lb 15.4 oz)-96.83 kg (213 lb 7.5 oz)] 96.6 kg (212 lb 15.4 oz) (04/20 0830) Weight change: 0.43 kg (15.2 oz)  Intake/Output from previous day: 04/19 0701 - 04/20 0700 In: 240 [P.O.:240] Out: 4 [Stool:4] Intake/Output this shift:   Lab Results:  Recent Labs  07/20/14 1040 07/21/14 0508  WBC 10.0 9.3  HGB 9.2* 9.3*  HCT 28.7* 29.2*  PLT 196 225   BMET:  Recent Labs  07/21/14 0508 07/22/14 0450  NA 138 137  K 3.7 3.8  CL 98 98  CO2 25 22  GLUCOSE 94 138*  BUN 26* 34*  CREATININE 3.89* 5.40*  CALCIUM 7.8* 8.0*  ALBUMIN 1.7* 1.7*   No results for input(s): PTH in the last 72 hours. Iron Studies: No results for input(s): IRON, TIBC, TRANSFERRIN, FERRITIN in the last 72 hours.  Studies/Results: No results found.   EXAM: General appearance: Alert, in no apparent distress Resp: CTA without rales, rhonchi, or wheezes Cardio: RRR without murmur or rub GI: + BS, soft with mild tenderness Extremities: 1+ edema @ hips, flanks, LUE Access: R IJ catheter  HD: new start, on MWF schedule here. Lowest wt here 90kg approx   Assessment/Plan: 1. New ESRD - started HD 3/17, using R IJ catheter, BP currently stable to continue fluid removal. 2. Dialysis access - AVF @ [facility] placed 01/28/14 by Dr. Darrick PennaFields, functioned poorly, revision pending as outpatient; using R IJ catheter placed by Dr. Hart RochesterLawson 3/26.  3. Perforated pyloric ulcer / necrotic bowel - s/p repair of perforated ulcer & partial ileum resection 3/30, on Zosyn. 4. Recurrent sepsis - with percutaneous drain (placed 4/8) of pelvic fluid. 5. C diff colitis - on PO  Vancomycin. 6. 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 7. Hypotension/Volume - on Midodrine, severe R heart failure per TEE, UF goal 3.5 L today.  Continue to lower wt. Stille edematous / vol excess, cont volume removal 8. Anemia - Hgb 9.3, s/p 2 U PRBCs 4/13, on Aranesp 150 mcg on Mon; Fe 5% on 3/18. 9. Sec HPT - Ca 8 (9.8 corrected), P 7.9, iPTH 338. 10. Nutrition - Alb 1.7, thin carb-mod diet. 11. PSVT - per Cardiology   LOS: 35 days   [name],[name] 07/22/2014,9:02 AM  Pt seen, examined, agree w assess/plan as above with additions as indicated.  Vinson Moselleob Dago Jungwirth MD pager [phone]    cell [phone] 07/22/2014, 11:41 AM

## 2014-07-22 NOTE — Progress Notes (Signed)
Subjective:  Just back from dialysis; feels fatigues  Objective:   Vital Signs : Filed Vitals:   07/22/14 1030 07/22/14 1100 07/22/14 1130 07/22/14 1153  BP: 114/58 112/53 97/53 110/66  Pulse: 106 102 108 107  Temp:    97.3 F (36.3 C)  TempSrc:    Oral  Resp: $Remo'22 20 20 20  'ytvfE$ Height:      Weight:    203 lb 11.3 oz (92.4 kg)  SpO2:    98%    Intake/Output from previous day:  Intake/Output Summary (Last 24 hours) at 07/22/14 1503 Last data filed at 07/22/14 1153  Gross per 24 hour  Intake    120 ml  Output   3447 ml  Net  -3327 ml   Net:  I/O since admission:  Wt Readings from Last 3 Encounters:  07/22/14 203 lb 11.3 oz (92.4 kg)  06/01/14 225 lb (102.059 kg)  01/28/14 230 lb (104.327 kg)    Medications: . amiodarone  200 mg Oral BID  . darbepoetin (ARANESP) injection - DIALYSIS  150 mcg Intravenous Q Mon-HD  . feeding supplement (NEPRO CARB STEADY)  237 mL Oral BID BM  . feeding supplement (RESOURCE BREEZE)  1 Container Oral Q1500  . insulin aspart  0-9 Units Subcutaneous TID WC  . ipratropium  0.5 mg Nebulization TID  . levalbuterol  0.63 mg Nebulization TID  . metoprolol tartrate  25 mg Oral BID  . midodrine  10 mg Oral BID WC  . piperacillin-tazobactam (ZOSYN)  IV  2.25 g Intravenous 3 times per day  . sodium chloride  10-40 mL Intracatheter Q12H  . vancomycin  125 mg Oral 4 times per day       Physical Exam:   General appearance: alert, cooperative and fatigued Neck: no adenopathy, no JVD, supple, symmetrical, trachea midline and thyroid not enlarged, symmetric, no tenderness/mass/nodules Lungs: no wheezes Heart: tachycardic at 108, 1/6 sem Abdomen: soft, non-tender; bowel sounds normal; no masses,  no organomegaly Extremities: no edema, redness or tenderness in the calves or thighs Neurologic: Grossly normal   Rate: 110  Rhythm: sinus tachycardia   Lab Results:  BMP Latest Ref Rng 07/22/2014 07/21/2014 07/20/2014  Glucose 70 - 99 mg/dL 138(H)  94 116(H)  BUN 6 - 23 mg/dL 34(H) 26(H) 65(H)  Creatinine 0.50 - 1.10 mg/dL 5.40(H) 3.89(H) 6.48(H)  Sodium 135 - 145 mmol/L 137 138 134(L)  Potassium 3.5 - 5.1 mmol/L 3.8 3.7 4.1  Chloride 96 - 112 mmol/L 98 98 96  CO2 19 - 32 mmol/L $RemoveB'22 25 20  'gsMUKpjH$ Calcium 8.4 - 10.5 mg/dL 8.0(L) 7.8(L) 7.9(L)     CBC Latest Ref Rng 07/22/2014 07/21/2014 07/20/2014  WBC 4.0 - 10.5 K/uL 8.6 9.3 10.0  Hemoglobin 12.0 - 15.0 g/dL 9.2(L) 9.3(L) 9.2(L)  Hematocrit 36.0 - 46.0 % 28.4(L) 29.2(L) 28.7(L)  Platelets 150 - 400 K/uL 264 225 196     No results for input(s): TROPONINI in the last 72 hours.  Invalid input(s): CK, MB  Hepatic Function Panel  Recent Labs  07/22/14 0450  PROT 6.0  ALBUMIN 1.7*  AST 25  ALT 23  ALKPHOS 136*  BILITOT 1.1  BILIDIR 0.4  IBILI 0.7   No results for input(s): INR in the last 72 hours. BNP (last 3 results)  Recent Labs  06/17/14 2043  BNP 1378.8*    ProBNP (last 3 results) No results for input(s): PROBNP in the last 8760 hours.   Lipid Panel     Component Value  Date/Time   CHOL 156 06/20/2014 2037   TRIG 88 07/06/2014 1015   HDL 20* 06/20/2014 2037   CHOLHDL 7.8 06/20/2014 2037   VLDL 59* 06/20/2014 2037   LDLCALC 77 06/20/2014 2037   Hepatic Function Latest Ref Rng 07/22/2014 07/21/2014 07/20/2014  Total Protein 6.0 - 8.3 g/dL 6.0 - -  Albumin 3.5 - 5.2 g/dL 1.7(L) 1.7(L) 1.6(L)  AST 0 - 37 U/L 25 - -  ALT 0 - 35 U/L 23 - -  Alk Phosphatase 39 - 117 U/L 136(H) - -  Total Bilirubin 0.3 - 1.2 mg/dL 1.1 - -  Bilirubin, Direct 0.0 - 0.5 mg/dL 0.4 - -      Imaging:  No results found.    Assessment/Plan:   Principal Problem:   Acute respiratory failure with hypoxia Active Problems:   Cerebral thrombosis with cerebral infarction   Hypertension   ESRD needing dialysis   Obesity (BMI 30-39.9)   Depression   Left renal mass   Chronic diastolic heart failure   T9C7 influenza   Tobacco use disorder   Renal failure (ARF), acute on  chronic   Hyperlipidemia   PSVT (paroxysmal supraventricular tachycardia)   Hypoxia   SOB (shortness of breath)   Perforated gastric ulcer   Pelvic fluid collection   Abdominal abscess   Enteritis due to Clostridium difficile   1. PSVT 2. ESRD 3. Chronic diastolic heart failure 4. Perforted pyloric ulcer/ s/p repair 5. CVA 6. Hypoalbuminemic  HR better today; amiodorone started yesterday at 200 mg bid. No recurrent SVT. BP 110/60.  LFT's ok except increase alk phos.  Troy Sine, MD, [facility] 07/22/2014, 3:03 PM

## 2014-07-22 NOTE — Progress Notes (Signed)
[name], female   DOB: 1955-08-27, 59 y.o.   MRN: [id number]     [place]      [address] [place], [place] [address]    Phone: [phone] FAX: [phone]     Subjective: less diarrhea.  abd is sore.  Appetite is okay.  No drain output recorded.  abd wound apparently changed last night.   Objective:  Vital signs:  Filed Vitals:   07/22/14 1030 07/22/14 1100 07/22/14 1130 07/22/14 1153  BP: 114/58 112/53 97/53 110/66  Pulse: 106 102 108 107  Temp:    97.3 F (36.3 C)  TempSrc:    Oral  Resp: _0 Height:      Weight:    92.4 kg (203 lb 11.3 oz)  SpO2:    98%    Last BM Date: 07/21/14  Intake/Output   Yesterday:  04/19 0701 - 04/20 0700 In: 240 [P.O.:240] Out: 4 [Stool:4] This shift:  Total I/O In: -  Out: 7048 [[id number]]    Physical Exam: General: Pt awake/alert/oriented x4 in no acute distress  Abdomen: Soft.  Nondistended.  abd drain with grayish output.  Midline wound with fibrinous exudate, very old dressing.  Fascia is intact.  No evidence of peritonitis.  No incarcerated hernias.    Problem List:   Principal Problem:   Acute respiratory failure with hypoxia Active Problems:   Cerebral thrombosis with cerebral infarction   Hypertension   ESRD needing dialysis   Obesity (BMI 30-39.9)   Depression   Left renal mass   Chronic diastolic heart failure   T8U8 influenza   Tobacco use disorder   Renal failure (ARF), acute on chronic   Hyperlipidemia   PSVT (paroxysmal supraventricular tachycardia)   Hypoxia   SOB (shortness of breath)   Perforated gastric ulcer   Pelvic fluid collection   Abdominal abscess   Enteritis due to Clostridium difficile    Results:   Labs: Results for orders placed or performed during the hospital encounter of 06/17/14 (from the past 48 hour(s))  Glucose, capillary     Status: None   Collection Time: 07/20/14  6:26 PM  Result Value  Ref Range   Glucose-Capillary 74 70 - 99 mg/dL  Hepatitis B surface antigen     Status: None   Collection Time: 07/20/14  8:22 PM  Result Value Ref Range   Hepatitis B Surface Ag NEGATIVE NEGATIVE    Comment: Performed at [facility]  CBC     Status: Abnormal   Collection Time: 07/21/14  5:08 AM  Result Value Ref Range   WBC 9.3 4.0 - 10.5 K/uL   RBC 3.31 (L) 3.87 - 5.11 MIL/uL   Hemoglobin 9.3 (L) 12.0 - 15.0 g/dL   HCT 29.2 (L) 36.0 - 46.0 %   MCV 88.2 78.0 - 100.0 fL   MCH 28.1 26.0 - 34.0 pg   MCHC 31.8 30.0 - 36.0 g/dL   RDW 22.3 (H) 11.5 - 15.5 %   Platelets 225 150 - 400 K/uL  Renal function panel     Status: Abnormal   Collection Time: 07/21/14  5:08 AM  Result Value Ref Range   Sodium 138 135 - 145 mmol/L   Potassium 3.7 3.5 - 5.1 mmol/L   Chloride 98 96 - 112 mmol/L   CO2 25 19 - 32 mmol/L   Glucose, Bld 94 70 - 99 mg/dL   BUN 26 (H)  6 - 23 mg/dL    Comment: DELTA CHECK NOTED   Creatinine, Ser 3.89 (H) 0.50 - 1.10 mg/dL    Comment: DELTA CHECK NOTED   Calcium 7.8 (L) 8.4 - 10.5 mg/dL   Phosphorus 4.6 2.3 - 4.6 mg/dL   Albumin 1.7 (L) 3.5 - 5.2 g/dL   GFR calc non Af Amer 12 (L) >90 mL/min   GFR calc Af Amer 14 (L) >90 mL/min    Comment: (NOTE) The eGFR has been calculated using the CKD EPI equation. This calculation has not been validated in all clinical situations. eGFR's persistently <90 mL/min signify possible Chronic Kidney Disease.    Anion gap 15 5 - 15  Glucose, capillary     Status: None   Collection Time: 07/21/14  7:47 AM  Result Value Ref Range   Glucose-Capillary 98 70 - 99 mg/dL  Glucose, capillary     Status: Abnormal   Collection Time: 07/21/14 11:50 AM  Result Value Ref Range   Glucose-Capillary 101 (H) 70 - 99 mg/dL  Glucose, capillary     Status: None   Collection Time: 07/21/14  5:10 PM  Result Value Ref Range   Glucose-Capillary 80 70 - 99 mg/dL  Glucose, capillary     Status: Abnormal   Collection Time: 07/21/14  8:47 PM   Result Value Ref Range   Glucose-Capillary 141 (H) 70 - 99 mg/dL  Hepatic function panel     Status: Abnormal   Collection Time: 07/22/14  4:50 AM  Result Value Ref Range   Total Protein 6.0 6.0 - 8.3 g/dL   Albumin 1.7 (L) 3.5 - 5.2 g/dL   AST 25 0 - 37 U/L   ALT 23 0 - 35 U/L   Alkaline Phosphatase 136 (H) 39 - 117 U/L   Total Bilirubin 1.1 0.3 - 1.2 mg/dL   Bilirubin, Direct 0.4 0.0 - 0.5 mg/dL   Indirect Bilirubin 0.7 0.3 - 0.9 mg/dL  TSH     Status: Abnormal   Collection Time: 07/22/14  4:50 AM  Result Value Ref Range   TSH 6.619 (H) 0.350 - 4.500 uIU/mL  Basic metabolic panel     Status: Abnormal   Collection Time: 07/22/14  4:50 AM  Result Value Ref Range   Sodium 137 135 - 145 mmol/L   Potassium 3.8 3.5 - 5.1 mmol/L   Chloride 98 96 - 112 mmol/L   CO2 22 19 - 32 mmol/L   Glucose, Bld 138 (H) 70 - 99 mg/dL   BUN 34 (H) 6 - 23 mg/dL   Creatinine, Ser 5.40 (H) 0.50 - 1.10 mg/dL   Calcium 8.0 (L) 8.4 - 10.5 mg/dL   GFR calc non Af Amer 8 (L) >90 mL/min   GFR calc Af Amer 9 (L) >90 mL/min    Comment: (NOTE) The eGFR has been calculated using the CKD EPI equation. This calculation has not been validated in all clinical situations. eGFR's persistently <90 mL/min signify possible Chronic Kidney Disease.    Anion gap 17 (H) 5 - 15  Magnesium     Status: None   Collection Time: 07/22/14  4:50 AM  Result Value Ref Range   Magnesium 1.9 1.5 - 2.5 mg/dL  Glucose, capillary     Status: Abnormal   Collection Time: 07/22/14  7:43 AM  Result Value Ref Range   Glucose-Capillary 123 (H) 70 - 99 mg/dL   Comment 1 Notify RN    Comment 2 Document in Chart   CBC  Status: Abnormal   Collection Time: 07/22/14  8:40 AM  Result Value Ref Range   WBC 8.6 4.0 - 10.5 K/uL   RBC 3.24 (L) 3.87 - 5.11 MIL/uL   Hemoglobin 9.2 (L) 12.0 - 15.0 g/dL   HCT 28.4 (L) 36.0 - 46.0 %   MCV 87.7 78.0 - 100.0 fL   MCH 28.4 26.0 - 34.0 pg   MCHC 32.4 30.0 - 36.0 g/dL   RDW 22.4 (H) 11.5 -  15.5 %   Platelets 264 150 - 400 K/uL  Glucose, capillary     Status: None   Collection Time: 07/22/14 12:27 PM  Result Value Ref Range   Glucose-Capillary 84 70 - 99 mg/dL  Glucose, capillary     Status: None   Collection Time: 07/22/14  1:09 PM  Result Value Ref Range   Glucose-Capillary 94 70 - 99 mg/dL    Imaging / Studies: No results found.  Medications / Allergies:  Scheduled Meds: . amiodarone  200 mg Oral BID  . darbepoetin (ARANESP) injection - DIALYSIS  150 mcg Intravenous Q Mon-HD  . feeding supplement (NEPRO CARB STEADY)  237 mL Oral BID BM  . feeding supplement (RESOURCE BREEZE)  1 Container Oral Q1500  . insulin aspart  0-9 Units Subcutaneous TID WC  . ipratropium  0.5 mg Nebulization TID  . levalbuterol  0.63 mg Nebulization TID  . metoprolol tartrate  25 mg Oral BID  . midodrine  10 mg Oral BID WC  . piperacillin-tazobactam (ZOSYN)  IV  2.25 g Intravenous 3 times per day  . sodium chloride  10-40 mL Intracatheter Q12H  . vancomycin  125 mg Oral 4 times per day   Continuous Infusions:  PRN Meds:.anticoagulant sodium citrate, bisacodyl, fentaNYL (SUBLIMAZE) injection, HYDROcodone-acetaminophen, metoprolol, morphine injection, ondansetron (ZOFRAN) IV, sodium chloride  Antibiotics: Anti-infectives    Start     Dose/Rate Route Frequency Ordered Stop   07/18/14 1800  vancomycin (VANCOCIN) 50 mg/mL oral solution 125 mg     125 mg Oral 4 times per day 07/18/14 1455     07/17/14 1830  vancomycin (VANCOCIN) IVPB 750 mg/150 ml premix     750 mg 150 mL/hr over 60 Minutes Intravenous  Once 07/17/14 1818 07/18/14 0024   07/15/14 1400  metroNIDAZOLE (FLAGYL) tablet 500 mg  Status:  Discontinued     500 mg Oral 3 times per day 07/15/14 1202 07/18/14 1533   07/13/14 2200  piperacillin-tazobactam (ZOSYN) IVPB 2.25 g     2.25 g 100 mL/hr over 30 Minutes Intravenous 3 times per day 07/13/14 1318     07/13/14 2000  fluconazole (DIFLUCAN) IVPB 200 mg  Status:  Discontinued      200 mg 100 mL/hr over 60 Minutes Intravenous Every 24 hours 07/13/14 1318 07/19/14 1027   07/07/14 1200  vancomycin (VANCOCIN) IVPB 1000 mg/200 mL premix  Status:  Discontinued     1,000 mg 200 mL/hr over 60 Minutes Intravenous Every 24 hours 07/07/14 0937 07/14/14 1712   07/06/14 2000  fluconazole (DIFLUCAN) IVPB 400 mg  Status:  Discontinued     400 mg 100 mL/hr over 120 Minutes Intravenous Every 24 hours 07/06/14 1507 07/13/14 1318   07/06/14 1800  piperacillin-tazobactam (ZOSYN) IVPB 2.25 g  Status:  Discontinued     2.25 g 100 mL/hr over 30 Minutes Intravenous 4 times per day 07/06/14 1505 07/13/14 1318   07/05/14 2000  fluconazole (DIFLUCAN) IVPB 200 mg  Status:  Discontinued     200 mg  100 mL/hr over 60 Minutes Intravenous Every 24 hours 07/05/14 0757 07/06/14 1507   07/05/14 1400  piperacillin-tazobactam (ZOSYN) IVPB 2.25 g  Status:  Discontinued     2.25 g 100 mL/hr over 30 Minutes Intravenous 3 times per day 07/05/14 0749 07/06/14 1505   07/02/14 1800  vancomycin (VANCOCIN) IVPB 1000 mg/200 mL premix  Status:  Discontinued     1,000 mg 200 mL/hr over 60 Minutes Intravenous Every 24 hours 07/01/14 1858 07/05/14 0801   07/01/14 2200  piperacillin-tazobactam (ZOSYN) IVPB 3.375 g  Status:  Discontinued     3.375 g 100 mL/hr over 30 Minutes Intravenous 4 times per day 07/01/14 1858 07/05/14 0749   07/01/14 2000  fluconazole (DIFLUCAN) IVPB 400 mg  Status:  Discontinued     400 mg 100 mL/hr over 120 Minutes Intravenous Every 24 hours 07/01/14 1858 07/05/14 0757   07/01/14 1400  fluconazole (DIFLUCAN) IVPB 200 mg  Status:  Discontinued     200 mg 100 mL/hr over 60 Minutes Intravenous Every 24 hours 07/01/14 1330 07/01/14 1853   07/01/14 1000  piperacillin-tazobactam (ZOSYN) IVPB 2.25 g  Status:  Discontinued     2.25 g 100 mL/hr over 30 Minutes Intravenous Every 8 hours 07/01/14 0952 07/01/14 1853   07/01/14 1000  vancomycin (VANCOCIN) 500 mg in sodium chloride 0.9 % 100 mL IVPB      500 mg 100 mL/hr over 60 Minutes Intravenous  Once 07/01/14 0952 07/01/14 1126   06/30/14 1200  vancomycin (VANCOCIN) IVPB 1000 mg/200 mL premix     1,000 mg 200 mL/hr over 60 Minutes Intravenous  Once 06/30/14 0944 06/30/14 1403   06/30/14 1200  ceFEPIme (MAXIPIME) 2 g in dextrose 5 % 50 mL IVPB     2 g 100 mL/hr over 30 Minutes Intravenous  Once 06/30/14 0944 06/30/14 1425   06/30/14 0100  vancomycin (VANCOCIN) 2,000 mg in sodium chloride 0.9 % 500 mL IVPB     2,000 mg 250 mL/hr over 120 Minutes Intravenous  Once 06/30/14 0048 06/30/14 0525   06/30/14 0100  ceFEPIme (MAXIPIME) 2 g in dextrose 5 % 50 mL IVPB     2 g 100 mL/hr over 30 Minutes Intravenous  Once 06/30/14 0048 06/30/14 0322   06/27/14 0600  cefUROXime (ZINACEF) 1.5 g in dextrose 5 % 50 mL IVPB     1.5 g 100 mL/hr over 30 Minutes Intravenous On call to O.R. 06/26/14 1019 06/27/14 0630   06/19/14 1800  oseltamivir (TAMIFLU) capsule 30 mg     30 mg Oral Every M-W-F (1800) 06/19/14 1003 06/22/14 1841   06/18/14 1600  oseltamivir (TAMIFLU) capsule 30 mg  Status:  Discontinued     30 mg Oral Daily 06/18/14 1538 06/19/14 1003       Assessment/Plan POD #21 s/p EXPLORATORY LAPAROTOMY WITH CLOSURE OF PERFORATED PYLORIC ULCER, SMALL BOWEL RESECTION - 07/01/2014 - Dalbert Batman Intra-abdominal abscess -IR drain was pulled out.  Surgical drain--increased output, gray, no output recorded -CT in AM to re-eval abscess -increase dressing changes to TID to help clean up -I&Os and routine drain care VTE prophylaxis-SCDs ID--zosyn D#[id number] for intra-abdominal abscess.  Await CT before stopping FEN-tolerating POs Dispo-CT of abdomen and pelvis in AM   Erby Pian, ANP-BC [facility] Pager [phone](7A-4:30P) For consults and floor pages call [phone](7A-4:30P)  07/22/2014 2:10 PM

## 2014-07-22 NOTE — Progress Notes (Signed)
Progress Note [facility].   [name] ZOX:[id number] DOB: 1956/03/19 DOA: 06/17/2014 PCP: [name], MD  Admit HPI / Brief Narrative: 59 yo female smoker with hx HTN, ESRD on HD (just began this admit), COPD, failed L AV fistula, initially admitted 3/16 with flu and acute hypoxic resp failure. Ultimately tx to [facility] due to need for HD. 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   Patient on 3/31 was taking to the OR for ex lap for per pyloric ulcer and sm bowel resection. Patient develops septic shock on 4-1, she was started on pressors and wean off pressors on 4-3. Subsequently she was diagnosed with pelvic abscess by CT scan perform on 4-7. She underwent pelvic drain placement by IR. Patient also develops C. diff diagnosed 4-12.  C diff was not responding flagyl, and she was started on vancomycin 4-16. She also develops  run of SVT. Cardiology has been helping with management.    HPI/Subjective: Diarrhea improving; no CP, no SOB. HR controlled. Patient with clean abd dressings and afebrile. Appetite remains to be very poor.  Assessment/Plan:  C. difficile colitis C diff positive on 4-12.  Received 4 flagyl without improvement.  Continue with  Vancomycin, oral. Day 4  Acute hypoxic respiratory failure secondary to H1 N1 influenza +/- HCAP Stable/resolved Has completed antibiotics and resp is stable Still on zosyn for abd abscess   PSVT Has been evaluated by EP - TSH is slightly elevated; will check Free T4 and T3 Cardiology following - follow K+ and Mg  Continue with metoprolol and amiodarone; cardiology adjusting medications.   Small PFO Has been evaluated by Cardiology No need for closure currently  ACA CVA with Lt sided weakness Will need ongoing long term rehab - PT/OT to cont to follow   ESRD new start HD this admit  Ongoing hemodialysis per Nephrology - permcath placed 3/26 d/t AVF problems Electrolytes abnormalities corrected with dialysis.   Patient to have AV fistula reviewed at some point for permanent access .   Status post exploratory laparotomy with closure of perforated pyloric ulcer/abdominal abscess / small bowel resection w/ postop ileus General Surgery continues to follow and is advancing diet  now off TNA and with calorie counting Appetite is poor  Pelvic abscess status post percutaneous drain placement 4/8 Ongoing care per general surgery and IR Day 22 zosyn.  Received  18 days of vancomycin and  18 days  of fluconazole.  Body fluid grew lactobacillus species.  Plan to repeat ct scan tomorrow 4/21 Will follow recommendations from [facility]  Chronic diastolic congestive heart failure w/ Severe RHF EF 60-65%, grade 1 diastolic dysfunction. Appears compensated.  Fluids/volume manage with hemodialysis.   Anemia of critical illness and chronic disease Hgb presently stable s/p 2U PRBC 4/13 - follow  HB remains stable.   Thrombocytopenia Appears to be associated with sepsis plt count now climbing again  follow trend  COPD Stable at present Continue current medication regimen  Diabetes mellitus 2 CBG reasonably controlled - cont to follow  No further Hypoglycemia  Patient eating poorly  Obesity - Body mass index is 32.89 kg/(m^2).  Code Status: FULL Family Communication: no family present at time of exam Disposition Plan:  continue PT/OT - will need long-term rehabilitation stay in hemodialysis capable nursing home   Consultants: Cardiology Nephrology PCCM Gen Surgery   Significant Events: 3/16 influenza A+ 3/18 R ACA stroke 3/19 MRI multifocal acute infarction of both hemispheres, large right  distal ACA 3/22 TEE severe RAE, severely reduced RV function, severe TR, no LAA thrombus, small PFO on TEE 3/31 to OR ex lap for per pyloric ulcer and sm bowel resection 4/1 septic shock on pressors 4/3 pressors weaned off  4/4 restart pressors, CRRT 4/6 levo switched to neo 4/7 pelvic abscess on CT 4/8  IR placed pelvic drain 4/9 off pressors 4/11 long run of SVT.  Antibiotics: Zosyn > Oral Flagyl 4/13 >  DVT prophylaxis: SCDs  Objective: Blood pressure 105/70, pulse 107, temperature 97.9 F (36.6 C), temperature source Oral, resp. rate 18, height 5\' 6"  (1.676 m), weight 92.4 kg (203 lb 11.3 oz), SpO2 97 %.  Intake/Output Summary (Last 24 hours) at 07/22/14 2234 Last data filed at 07/22/14 1823  Gross per 24 hour  Intake    380 ml  Output   3456 ml  Net  -3076 ml   Exam: General: status post HD, feeling weak and tired. No fever. Appetite still poor Lungs: scattered rhonchi, no wheezing, no crackles  Cardiovascular: Regular rate, no murmurs, no rubs and no gallops   Abdomen: Abdominal wound dressing, clean and dry, abdomen nondistended, drain intact and w/o drainage Extremities: No significant cyanosis, clubbing, edema bilateral lower extremities  Data Reviewed: Basic Metabolic Panel:  Recent Labs Lab 07/16/14 0440 07/17/14 0410 07/19/14 0535 07/20/14 1040 07/21/14 0508 07/22/14 0450  NA 128* 126* 131* 134* 138 137  K 3.5 5.3* 3.9 4.1 3.7 3.8  CL 96 94* 95* 96 98 98  CO2 21 16* 19 20 25 22   GLUCOSE 139* 70 96 116* 94 138*  BUN 68* 86* 57* 65* 26* 34*  CREATININE 3.53* 4.93* 5.17* 6.48* 3.89* 5.40*  CALCIUM 7.7* 7.8* 7.6* 7.9* 7.8* 8.0*  MG 1.7 1.9  --   --   --  1.9  PHOS 3.9  --   --  7.9* 4.6  --     Liver Function Tests:  Recent Labs Lab 07/16/14 0440 07/17/14 0410 07/20/14 1040 07/21/14 0508 07/22/14 0450  AST 21 21  --   --  25  ALT 43* 37*  --   --  23  ALKPHOS 108 119*  --   --  136*  BILITOT 1.8* 1.7*  --   --  1.1  PROT 5.3* 5.9*  --   --  6.0  ALBUMIN 1.6* 1.6* 1.6* 1.7* 1.7*   CBC:  Recent Labs Lab 07/17/14 0410 07/19/14 0535 07/20/14 1040 07/21/14 0508 07/22/14 0840  WBC 10.6* 10.1 10.0 9.3 8.6  HGB 9.6* 9.1* 9.2* 9.3* 9.2*  HCT 29.2* 27.9* 28.7* 29.2* 28.4*  MCV 84.9 86.4 87.2 88.2 87.7  PLT 79* 136* 196 225 264     CBG:  Recent Labs Lab 07/22/14 0743 07/22/14 1227 07/22/14 1309 07/22/14 1646 07/22/14 2044  GLUCAP 123* 84 94 84 120*    Recent Results (from the past 240 hour(s))  Culture, blood (routine x 2)     Status: None   Collection Time: 07/13/14  3:45 PM  Result Value Ref Range Status   Specimen Description BLOOD RIGHT HAND  Final   Special Requests BOTTLES DRAWN AEROBIC ONLY 3CC  Final   Culture   Final    NO GROWTH 5 DAYS Performed at [facility]    Report Status 07/20/2014 FINAL  Final  Culture, blood (routine x 2)     Status: None   Collection Time: 07/13/14  4:00 PM  Result Value Ref Range Status   Specimen Description BLOOD  RIGHT HAND  Final   Special Requests BOTTLES DRAWN AEROBIC ONLY 2CC  Final   Culture   Final    NO GROWTH 5 DAYS Note: Culture results may be compromised due to an inadequate volume of blood received in culture bottles. Performed at [facility]    Report Status 07/20/2014 FINAL  Final  Clostridium Difficile by PCR     Status: Abnormal   Collection Time: 07/14/14 10:15 PM  Result Value Ref Range Status   C difficile by pcr POSITIVE (A) NEGATIVE Final    Comment: CRITICAL RESULT CALLED TO, READ BACK BY AND VERIFIED WITH: Candy Sledge RN 9:45 07/15/14 ([email or web address])      Scheduled Meds:  Scheduled Meds: . amiodarone  200 mg Oral BID  . darbepoetin (ARANESP) injection - DIALYSIS  150 mcg Intravenous Q Mon-HD  . feeding supplement (NEPRO CARB STEADY)  237 mL Oral BID BM  . feeding supplement (PRO-STAT SUGAR FREE 64)  30 mL Oral TID BM  . feeding supplement (RESOURCE BREEZE)  1 Container Oral Q1500  . insulin aspart  0-9 Units Subcutaneous TID WC  . ipratropium  0.5 mg Nebulization TID  . levalbuterol  0.63 mg Nebulization TID  . metoprolol tartrate  25 mg Oral BID  . midodrine  10 mg Oral BID WC  . piperacillin-tazobactam (ZOSYN)  IV  2.25 g Intravenous 3 times per day  . sodium chloride  10-40 mL Intracatheter Q12H  . vancomycin   125 mg Oral 4 times per day    Time spent on care of this patient: 35 mins   Vassie Loll , MD  [phone] [facility] Office  [phone] Pager - Text Page per Amion as per below:  On-Call/Text Page:      [email or web address]      password [email or web address]  If 7PM-7AM, please contact night-coverage [email or web address] Password [email or web address] 07/22/2014, 10:34 PM   LOS: 35 days

## 2014-07-22 NOTE — Progress Notes (Signed)
Day 2 of 2-Calorie Count Note  48 hour calorie count ordered.  Diet: Carbohydrate modified  Supplements:   Nepro Shake po BID, each supplement provides 425 kcal and 19 grams protein.  Resource Breeze po once daily, each supplement provides 250 kcal and 9 grams of protein  Breakfast: 40 kcal, 3 grams of protein Lunch: 170 kcal, 2 grams of protein Dinner: 175 kcal, 12 grams of protein Supplements: 250 kcal, 9 grams of protein  Day 2 Total intake: 635 kcal (30% of minimum estimated needs)  26 grams of protein (21% of minimum estimated needs)  Re-Estimated Nutrition Needs: Kcal: 2100-2300 Protein: 115-135 grams Fluid: 1.2 L/day  Pt continues to have a decreased appetite. PO intake has been poor and pt has not been meeting nutrition needs. Recommend consideration of enteral nutrition. In the mean while, RD to order Prostat and Magic cup as pt is agreeable to it. Noted, pt has not been consuming most of her supplements, however reports she will consume them later. Pt was strongly encouraged to eat her food at meals and to consume her supplements.  If enteral nutrition is considered, recommend Nepro formula at 25 ml/hr and increasing by 10 ml every 4 hours to goal rate of 45 ml/hr with 30 ml Prostat BID to provide 2144 kcal (100% of needs), 117 grams of protein, and 788 ml of free water.  Nutrition Dx:  Inadequate oral intake related to altered GI function as evidenced by limited intake; ongoing  Goal:  Pt to meet >/= 90% of their estimated nutrition needs; not met  Intervention:   Discontinue calorie count.  Continue Nepro Shake po BID, each supplement provides 425 kcal and 19 grams protein.  Continue Resource Breeze po once daily, each supplement provides 250 kcal and 9 grams of protein.  Provide 30 ml Prostat TID, each supplement provides 100 kcal and 15 grams of protein.  Provide nourishment snacks. Ordered.   Recommend consideration of starting enteral nutrition as pt  is not meeting nutrition needs. (TF recommendations stated above)  Kallie Locks, MS, RD, LDN Pager # [phone] After hours/ weekend pager # [phone]

## 2014-07-23 DIAGNOSIS — J101 Influenza due to other identified influenza virus with other respiratory manifestations: Secondary | ICD-10-CM

## 2014-07-23 DIAGNOSIS — J438 Other emphysema: Secondary | ICD-10-CM | POA: Insufficient documentation

## 2014-07-23 DIAGNOSIS — I370 Nonrheumatic pulmonary valve stenosis: Secondary | ICD-10-CM

## 2014-07-23 LAB — GLUCOSE, CAPILLARY
Glucose-Capillary: 103 mg/dL — ABNORMAL HIGH (ref 70–99)
Glucose-Capillary: 106 mg/dL — ABNORMAL HIGH (ref 70–99)
Glucose-Capillary: 111 mg/dL — ABNORMAL HIGH (ref 70–99)

## 2014-07-23 LAB — T4, FREE: FREE T4: 1.07 ng/dL (ref 0.80–1.80)

## 2014-07-23 LAB — TSH: TSH: 7.34 u[IU]/mL — ABNORMAL HIGH (ref 0.350–4.500)

## 2014-07-23 MED ORDER — HEPARIN SODIUM (PORCINE) 1000 UNIT/ML DIALYSIS
1000.0000 [IU] | INTRAMUSCULAR | Status: DC | PRN
  Filled 2014-07-23: qty 1

## 2014-07-23 MED ORDER — NEPRO/CARBSTEADY PO LIQD: 237.0000 mL | ORAL | Status: DC | PRN

## 2014-07-23 MED ORDER — SODIUM CHLORIDE 0.9 % IV SOLN: 100.0000 mL | INTRAVENOUS | Status: DC | PRN

## 2014-07-23 MED ORDER — ALTEPLASE 2 MG IJ SOLR
2.0000 mg | Freq: Once | INTRAMUSCULAR | Status: AC | PRN
  Filled 2014-07-23: qty 2

## 2014-07-23 MED ORDER — LIDOCAINE HCL (PF) 1 % IJ SOLN: 5.0000 mL | INTRAMUSCULAR | Status: DC | PRN

## 2014-07-23 MED ORDER — BUDESONIDE 0.25 MG/2ML IN SUSP
0.2500 mg | Freq: Two times a day (BID) | RESPIRATORY_TRACT | Status: DC
  Administered 2014-07-23 – 2014-09-11 (×87): 0.25 mg via RESPIRATORY_TRACT
  Filled 2014-07-23 (×108): qty 2

## 2014-07-23 MED ORDER — HEPARIN SODIUM (PORCINE) 1000 UNIT/ML DIALYSIS
2000.0000 [IU] | INTRAMUSCULAR | Status: DC | PRN
  Filled 2014-07-23: qty 2

## 2014-07-23 MED ORDER — LIDOCAINE-PRILOCAINE 2.5-2.5 % EX CREA: 1.0000 "application " | TOPICAL_CREAM | CUTANEOUS | Status: DC | PRN

## 2014-07-23 MED ORDER — PENTAFLUOROPROP-TETRAFLUOROETH EX AERO: 1.0000 "application " | INHALATION_SPRAY | CUTANEOUS | Status: DC | PRN

## 2014-07-23 NOTE — Progress Notes (Signed)
Subjective:  Seen on dialysis, quiet, but no complaints  Objective: Vital signs in last 24 hours: Temp:  [97.3 F (36.3 C)-98.2 F (36.8 C)] 98.2 F (36.8 C) (04/21 0945) Pulse Rate:  [89-108] 89 (04/21 0945) Resp:  [16-20] 16 (04/21 0945) BP: (97-130)/(53-82) 127/77 mmHg (04/21 0945) SpO2:  [94 %-98 %] 98 % (04/21 0945) Weight:  [92.4 kg (203 lb 11.3 oz)] 92.4 kg (203 lb 11.3 oz) (04/20 2045) Weight change: -0.23 kg (-8.1 oz)  Intake/Output from previous day: 04/20 0701 - 04/21 0700 In: 620 [P.O.:600; I.V.:20] Out: 3556 [Drains:110; Stool:2] Intake/Output this shift: Total I/O In: 120 [P.O.:120] Out: 1 [Stool:1] Lab Results:  Recent Labs  07/21/14 0508 07/22/14 0840  WBC 9.3 8.6  HGB 9.3* 9.2*  HCT 29.2* 28.4*  PLT 225 264   BMET:   Recent Labs  07/21/14 0508 07/22/14 0450  NA 138 137  K 3.7 3.8  CL 98 98  CO2 25 22  GLUCOSE 94 138*  BUN 26* 34*  CREATININE 3.89* 5.40*  CALCIUM 7.8* 8.0*  ALBUMIN 1.7* 1.7*   No results for input(s): PTH in the last 72 hours. Iron Studies: No results for input(s): IRON, TIBC, TRANSFERRIN, FERRITIN in the last 72 hours.  Studies/Results: No results found.   EXAM: General appearance: Alert, in no apparent distress Resp: CTA without rales, rhonchi, or wheezes Cardio: RRR without murmur or rub GI: + BS, soft with mild tenderness Extremities: 1+ edema @ hips, flanks, LUE Access: R IJ catheter  HD: new start, on MWF schedule here. Lowest wt here 90kg approx   Assessment/Plan: 1. New ESRD - started HD 3/17, using R IJ catheter due to difficulties using AVF 2. Vol excess - wt's down 92kg, still +LE edema, cont vol reduction with HD tomorrow 3. Dialysis access - AVF @ [facility] placed 01/28/14 by Dr. Darrick PennaFields, functioned poorly, revision pending as outpatient; using R IJ catheter placed by Dr. Hart RochesterLawson 3/26.  4. Perforated pyloric ulcer / necrotic bowel - s/p repair of perforated ulcer & partial ileum resection 3/30, on  Zosyn. New drainage from surgical drain (IR drain out); for CT today per surg 5. Recurrent sepsis - with percutaneous drain (placed 4/8) of pelvic fluid 6. C diff colitis - on PO Vancomycin. 7. 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 8. Hypotension / R HF - on midodrine 9. Anemia - Hgb 9.3, s/p 2 U PRBCs 4/13, on Aranesp 150 mcg on Mon; Fe 5% on 3/18. 10. Sec HPT - Ca 8 (9.8 corrected), P 7.9, iPTH 338. 11. Nutrition - Alb 1.7, thin carb-mod diet. 12. PSVT - per Cardiology   Stephanie Moselleob Clemma Johnsen MD pager [phone]    cell [phone] 07/23/2014, 10:45 AM

## 2014-07-23 NOTE — Progress Notes (Signed)
[name], female   DOB: 11-05-1955, [age] y.o.   MRN: [id number]   LOS: 36 days   Subjective: Feeling worse this morning w/SOB and abd pain   Objective: Vital signs in last 24 hours: Temp:  [97.3 F (36.3 C)-98 F (36.7 C)] 98 F (36.7 C) (04/21 0605) Pulse Rate:  [99-145] 99 (04/21 0605) Resp:  [16-22] 16 (04/21 0605) BP: (97-130)/(53-88) 130/82 mmHg (04/21 0605) SpO2:  [94 %-98 %] 94 % (04/21 0605) Weight:  [92.4 kg (203 lb 11.3 oz)] 92.4 kg (203 lb 11.3 oz) (04/20 2045) Last BM Date: 07/22/14   JP: 12510ml/24h   Laboratory  CBC  Recent Labs  07/21/14 0508 07/22/14 0840  WBC 9.3 8.6  HGB 9.3* 9.2*  HCT 29.2* 28.4*  PLT 225 264   BMET  Recent Labs  07/21/14 0508 07/22/14 0450  NA 138 137  K 3.7 3.8  CL 98 98  CO2 25 22  GLUCOSE 94 138*  BUN 26* 34*  CREATININE 3.89* 5.40*  CALCIUM 7.8* 8.0*    Physical Exam General appearance: alert and no distress Resp: clear to auscultation bilaterally Cardio: regular rate and rhythm GI: Soft, +BS, wound clean, pink   Assessment/Plan: POD #22 s/p EXPLORATORY LAPAROTOMY WITH CLOSURE OF PERFORATED PYLORIC ULCER, SMALL BOWEL RESECTION - 07/01/2014 - Derrell LollingIngram Intra-abdominal abscess -IR drain was pulled out. Surgical drain--increased output, gray, output variable but high yesterday -CT today to re-eval abscess -Change to VAC -I&Os and routine drain care VTE prophylaxis-SCDs ID--zosyn D#22 for intra-abdominal abscess. Await CT before stopping C diff + -- Oral vanc FEN-tolerating POs Dispo-CT of abdomen and pelvis today    Freeman CaldronMichael J. Nour Scalise, PA-C Pager: [phone] 07/23/2014

## 2014-07-23 NOTE — Clinical Social Work Note (Signed)
Clinical Social Work Assessment  Patient Details  Name: Stephanie Mikeal HawthorneC Faulconer MRN: [id number] Date of Birth: [birth year]  Date of referral:  07/23/14               Reason for consult:  Facility Placement                Permission sought to share information with:  Family Supports Permission granted to share information::     Name::      Roxy Horseman([name] Crosby [phone])  Agency::     Relationship::   Roxy Horseman([name] Bean - Daughter - [phone])  Contact Information:   [phone]  Housing/Transportation Living arrangements for the past 2 months:  Single Family Home Source of Information:  Patient Patient Interpreter Needed:  None Criminal Activity/Legal Involvement Pertinent to Current Situation/Hospitalization:  No - Comment as needed Significant Relationships:  Adult Children Lives with:  Self Do you feel safe going back to the place where you live?  Yes Need for family participation in patient care:  Yes (Comment)  Care giving concerns:   Critically ill.   Social Worker assessment / plan:   CSW-Intern spoke with Ms. Marcou at bedside regarding short term rehab. Patient is agreeable and at this time doesn't have any preferences. Patient reported that she has a son, however she indicated that her daughter Derrick RavelGermeisha is her only support.  Employment status:  Unemployed Health and safety inspectornsurance information:  [place] PT Recommendations:  Skilled Nursing Facility, LTAC (LTAC rec. however Medicaid will not pay , currently looking for SNF placement ) Information / Referral to community resources:  Skilled Nursing Facility  Patient/Family's Response to care:  [name] was open and responsive to speaking with CSW-Intern. Patient is agreeable to short term rehab.  Patient/Family's Understanding of and Emotional Response to Diagnosis, Current Treatment, and Prognosis:   Patient did not address these issues during conversation.  Emotional Assessment Appearance:  Appears older than stated  age Attitude/Demeanor/Rapport:  Other (Attitude was approaite for conversation and patient was agreeable to short term rehab) Affect (typically observed):  Accepting, Calm Orientation:  Oriented to Self, Oriented to Place, Oriented to  Time, Oriented to Situation Alcohol / Substance use:  Tobacco Use, Alcohol Use Psych involvement (Current and /or in the community):  No (Comment)  Discharge Needs  Concerns to be addressed:  Discharge Planning Concerns Readmission within the last 30 days:  No Current discharge risk:  Other (critically ill) Barriers to Discharge:  Other (None noted )   Gentri Guardado A, Student-SW 07/23/2014, 1:11 PM

## 2014-07-23 NOTE — Progress Notes (Signed)
PT Cancellation Note  Patient Details Name: [name] MRN: [id number] DOB: 1955-04-18   Cancelled Treatment:    Reason Eval/Treat Not Completed: Fatigue/lethargy limiting ability to participate.  Patient attempted to perform exercises.  Reports "I just can't.  I don't feel good"  Will return tomorrow for PT session.   [name], Zameria Vogl H 07/23/2014, 5:33 PM Durenda HurtSusan H. Renaldo Fiddleravis, PT, [facility] Acute Re[facility] Services Pager [phone]

## 2014-07-23 NOTE — Progress Notes (Signed)
Subjective: "Don't feel well today". Mild SOB at rest  Objective: Vital signs in last 24 hours: Temp:  [97.3 F (36.3 C)-98 F (36.7 C)] 98 F (36.7 C) (04/21 0605) Pulse Rate:  [99-145] 99 (04/21 0605) Resp:  [16-22] 16 (04/21 0605) BP: (97-130)/(53-88) 130/82 mmHg (04/21 0605) SpO2:  [94 %-98 %] 95 % (04/21 0924) Weight:  [203 lb 11.3 oz (92.4 kg)] 203 lb 11.3 oz (92.4 kg) (04/20 2045) Last BM Date: 07/22/14      Medications Current Facility-Administered Medications  Medication Dose Route Frequency Provider Last Rate Last Dose  . 0.9 %  sodium chloride infusion  100 mL Intravenous PRN Delano Metz, MD      . 0.9 %  sodium chloride infusion  100 mL Intravenous PRN Delano Metz, MD      . amiodarone (PACERONE) tablet 200 mg  200 mg Oral BID Lennette Bihari, MD   200 mg at 07/22/14 2219  . anticoagulant sodium citrate solution 5 mL  5 mL Intravenous PRN Annie Sable, MD   4.2 mL at 07/10/14 1441  . bisacodyl (DULCOLAX) suppository 10 mg  10 mg Rectal Daily PRN Megan N Dort, PA-C      . budesonide (PULMICORT) nebulizer solution 0.25 mg  0.25 mg Nebulization BID Vassie Loll, MD      . Darbepoetin Alfa (ARANESP) injection 150 mcg  150 mcg Intravenous Q Mon-HD Lauris Poag, MD   150 mcg at 07/20/14 1500  . feeding supplement (NEPRO CARB STEADY) liquid 237 mL  237 mL Oral BID BM Marijean Niemann, RD   237 mL at 07/22/14 1505  . feeding supplement (NEPRO CARB STEADY) liquid 237 mL  237 mL Oral PRN Delano Metz, MD      . feeding supplement (PRO-STAT SUGAR FREE 64) liquid 30 mL  30 mL Oral TID BM Marijean Niemann, RD   30 mL at 07/22/14 2000  . feeding supplement (RESOURCE BREEZE) (RESOURCE BREEZE) liquid 1 Container  1 Container Oral Q1500 Marijean Niemann, RD   1 Container at 07/22/14 1505  . fentaNYL (SUBLIMAZE) injection 25-50 mcg  25-50 mcg Intravenous Q2H PRN Carolan Clines, MD   50 mcg at 07/20/14 2005  . HYDROcodone-acetaminophen (NORCO/VICODIN) 5-325 MG per tablet 1-2  tablet  1-2 tablet Oral Q4H PRN Senaida Ores, PA-C   1 tablet at 07/19/14 2145  . insulin aspart (novoLOG) injection 0-9 Units  0-9 Units Subcutaneous TID WC Armandina Stammer, RPH   1 Units at 07/19/14 1747  . ipratropium (ATROVENT) nebulizer solution 0.5 mg  0.5 mg Nebulization TID Lonia Blood, MD   0.5 mg at 07/23/14 0924  . levalbuterol (XOPENEX) nebulizer solution 0.63 mg  0.63 mg Nebulization TID Lonia Blood, MD   0.63 mg at 07/23/14 1610  . lidocaine (PF) (XYLOCAINE) 1 % injection 5 mL  5 mL Intradermal PRN Delano Metz, MD      . lidocaine-prilocaine (EMLA) cream 1 application  1 application Topical PRN Delano Metz, MD      . metoprolol (LOPRESSOR) injection 2.5-5 mg  2.5-5 mg Intravenous Q3H PRN Oretha Milch, MD   5 mg at 07/20/14 1458  . metoprolol tartrate (LOPRESSOR) tablet 25 mg  25 mg Oral BID Lennette Bihari, MD   25 mg at 07/22/14 2219  . midodrine (PROAMATINE) tablet 10 mg  10 mg Oral BID WC Delano Metz, MD   10 mg at 07/22/14 1758  . morphine 2 MG/ML injection 1 mg  1 mg Intravenous Q4H PRN Rueben BashMegan N Dort, PA-C   1 mg at 07/19/14 0316  . ondansetron (ZOFRAN) injection 4 mg  4 mg Intravenous Q6H PRN Lupita Leashouglas B McQuaid, MD   4 mg at 07/06/14 2230  . pentafluoroprop-tetrafluoroeth (GEBAUERS) aerosol 1 application  1 application Topical PRN Delano Metzobert Schertz, MD      . piperacillin-tazobactam (ZOSYN) IVPB 2.25 g  2.25 g Intravenous 3 times per day Gardner CandleJessica C Carney, RPH   2.25 g at 07/23/14 [id number]  . sodium chloride 0.9 % injection 10-40 mL  10-40 mL Intracatheter Q12H Belkys A Regalado, MD   10 mL at 07/20/14 2200  . sodium chloride 0.9 % injection 10-40 mL  10-40 mL Intracatheter PRN Belkys A Regalado, MD   10 mL at 07/22/14 1707  . vancomycin (VANCOCIN) 50 mg/mL oral solution 125 mg  125 mg Oral 4 times per day Alba CoryBelkys A Regalado, MD   125 mg at 07/23/14 [id number]    PE: General appearance: alert, cooperative, no distress and resting comfortably, completely flat Lungs:  mild wheeze. Heart: regular rate and rhythm She has a pericardial friction rub with expiration Skin: Warm and dry   Lab Results:   Recent Labs  07/20/14 1040 07/21/14 0508 07/22/14 0840  WBC 10.0 9.3 8.6  HGB 9.2* 9.3* 9.2*  HCT 28.7* 29.2* 28.4*  PLT 196 225 264   BMET  Recent Labs  07/20/14 1040 07/21/14 0508 07/22/14 0450  NA 134* 138 137  K 4.1 3.7 3.8  CL 96 98 98  CO2 20 25 22   GLUCOSE 116* 94 138*  BUN 65* 26* 34*  CREATININE 6.48* 3.89* 5.40*  CALCIUM 7.9* 7.8* 8.0*     Assessment/Plan  Principal Problem:   Acute respiratory failure with hypoxia Active Problems:   Cerebral thrombosis with cerebral infarction   ESRD needing dialysis   Renal failure (ARF), acute on chronic   Hypoxia   Hypertension   Obesity (BMI 30-39.9)   H1N1 influenza   PSVT (paroxysmal supraventricular tachycardia)   SOB (shortness of breath)   Depression   Left renal mass   Chronic diastolic heart failure   Tobacco use disorder   Hyperlipidemia   Perforated gastric ulcer   Pelvic fluid collection   Abdominal abscess   Enteritis due to Clostridium difficile   Intra-abdominal abscess   Abnormal TSH  59 year old female with history of SVT, ESRD on HD, HTN-now hypotensive on Proamitine, resolved acute hypoxemic respiratory failure secondary to the flu who was noted to have weakness on her left side during hospital stay with CT scan demonstrating acute infarct of right anterior cerebral artery territory. Small PFO noted by transthoracic echo, no DVT/SVT by dopplers 06/18/14 . TEE 06/23/14: no LAA thrombus, severe RAE; mild RVE; severely reduced RV function; severe TR; bowing of atrial septum right to left; positive saline microcavitation study She was seen by Dr. Anne FuSkains on 06/24/14 and felt to have SVT or atrial tachycardia but no atrial fibrillation. She was seen by Dr. Graciela HusbandsKlein on 06/25/14 and he recommended 30 day event monitor at time of discharge. Since then she has had a prolonged  hospitalization since 06/17/14 complicated by perforated pyloric ulcer. She has had episodes of recurrence of SVT.   1. PSVT:  She has been seen during this admission by our EP team. 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 Recent episodes have limited dialysis. TSH normal this admission.  SVT most likely partially driven by hyperadrenergic state associated with present  illness. Telemetry reviewed; she continues to have SVT; hopefully will improve as present illness improves and Amiodarone levels increase.     2. ESRD: Per nephrology   Plan: recurrent PSVT this am, back in NSR now. I am concerned about pericardial rub, will check an echo. MD to review.    LOS: 36 days    Abelino Derrick PA-C 07/23/2014    Personally seen and discussed care plan with patient.  Rub heard today (?uremic) Plan on ECHO SVT recurrent - AMIO. Overtime should improve with AMIO.  Donato Schultz, MD

## 2014-07-23 NOTE — Progress Notes (Signed)
Occupational Therapy Treatment Patient Details Name: Benelli C Klinker MRN: [id number] DOB: [birth year] Today's Date: 07/23/2014    History of present illness Makyiah Mikeal HawthorneC Templeton is a 59 y.o. female with a history of CKD and hypertension [facility] complaining of shortness of breath and generalized weakness x 2 weeks. Admitted with acute hypoxemic respiratory failure. During hospital stay noted to have weakness of her left side. 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  She developed septic shock 4/1 with pressors weaned off 4/3.  Began CRRT and pressors restarted 4/4; Developed pelvic abcess 4/7 with pelvic drain placed 4/8.  weaned off pressors 4/9.  Runs of SVT 4/5 and 4/11   OT comments  Pt is making very slow progress with adls and adl mobility. Pt requires a lot of encouragement to participate in therapy.  Today's session cut short due to ECHO.    Follow Up Recommendations  LTACH    Equipment Recommendations  None recommended by OT    Recommendations for Other Services      Precautions / Restrictions Precautions Precautions: Fall Precaution Comments: Jp drain,  Restrictions Weight Bearing Restrictions: No       Mobility Bed Mobility Overal bed mobility: Needs Assistance;+2 for physical assistance Bed Mobility: Supine to Sit;Sit to Supine     Supine to sit: +2 for physical assistance;Mod assist Sit to supine: +2 for physical assistance;Max assist   General bed mobility comments: Pt fatigued today with less participation to get to EOB.  Transfers                      Balance Overall balance assessment: Needs assistance Sitting-balance support: Feet supported Sitting balance-Leahy Scale: Fair Sitting balance - Comments: Pt could sit unassisted but could not take challenges and only could do this for 10 seconds before needing  assist.  While pt groomed EOB, pt did need min assist to maintain balance. Postural control: Left lateral lean                         ADL Overall ADL's : Needs assistance/impaired     Grooming: Wash/dry hands;Wash/dry face;Oral care;Sitting Grooming Details (indicate cue type and reason): min assist to sit EOB while doing simple grooming tasks. Pt stated she was very tired and wanting to lay down.                             Functional mobility during ADLs: Maximal assistance General ADL Comments: Pt sat EOB with min assist to do grooming tasks. Pt not agreeable to getting out of bed.  Pt could hold self on side of bed for 5-10 seconds with min guard but at other times needed min asisst.  Session cut short due to ECHO.      Vision                     Perception     Praxis      Cognition   Behavior During Therapy: Flat affect Overall Cognitive Status: No family/caregiver present to determine baseline cognitive functioning                  General Comments: Pt continues with very flat affect throughout session.  Pt not wanting to  participate but with encouragement did finish exercises before ECHO came to do bedside test.    Extremity/Trunk Assessment               Exercises General Exercises - Upper Extremity Elbow Flexion: AROM;Both;10 reps;Supine Elbow Extension: AAROM;Left;10 reps;Supine Wrist Flexion: AAROM;Left;10 reps;Supine Wrist Extension: AAROM;Left;10 reps;Supine Digit Composite Flexion: AAROM;Left;10 reps;Supine Composite Extension: AAROM;Left;10 reps;Sidelying   Shoulder Instructions       General Comments      Pertinent Vitals/ Pain       Pain Assessment: No/denies pain  Home Living                                          Prior Functioning/Environment              Frequency Min 2X/week     Progress Toward Goals  OT Goals(current goals can now be found in the care plan section)   Progress towards OT goals: Progressing toward goals  Acute Rehab OT Goals Patient Stated Goal: to get better  OT Goal Formulation: With patient Time For Goal Achievement: 07/29/14 Potential to Achieve Goals: Good ADL Goals Pt Will Perform Grooming: with min guard assist Pt Will Perform Upper Body Bathing: with min assist;sitting Pt Will Perform Upper Body Dressing: with min assist;sitting Pt Will Transfer to Toilet: with mod assist;stand pivot transfer;bedside commode Pt/caregiver will Perform Home Exercise Program: Increased ROM;Left upper extremity;With written HEP provided;With Supervision Additional ADL Goal #1: Pt will maintain EOB sitting with min guard assist while performing simple ADL task Additional ADL Goal #2: Pt will use Lt UE as a gross assist consistently during ADLs  Additional ADL Goal #3: Pt will move to EOB with mod A +1 in prep for ADLs  Additional ADL Goal #4: Pt will participate in further visual assessment   Plan Discharge plan remains appropriate    Co-evaluation                 End of Session     Activity Tolerance Patient limited by fatigue   Patient Left in bed;with call bell/phone within reach   Nurse Communication Mobility status        Time: 1610-9604 OT Time Calculation (min): 20 min  Charges: OT General Charges $OT Visit: 1 Procedure OT Treatments $Self Care/Home Management : 8-22 mins  Hope Budds 07/23/2014, 11:45 AM  [phone]

## 2014-07-23 NOTE — Progress Notes (Signed)
  Echocardiogram 2D Echocardiogram has been performed.  [name], [name] 07/23/2014, 12:11 PM

## 2014-07-23 NOTE — Progress Notes (Addendum)
Progress Note [facility].   [name] WUJ:[id number] DOB: July 05, 1955 DOA: 06/17/2014 PCP: [name], MD  Admit HPI / Brief Narrative: 59 yo female smoker with hx HTN, ESRD on HD (just began this admit), COPD, failed L AV fistula, initially admitted 3/16 with flu and acute hypoxic resp failure. Ultimately tx to [facility] due to need for HD. 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   Patient on 3/31 was taking to the OR for ex lap for per pyloric ulcer and sm bowel resection. Patient develops septic shock on 4-1, she was started on pressors and wean off pressors on 4-3. Subsequently she was diagnosed with pelvic abscess by CT scan perform on 4-7. She underwent pelvic drain placement by IR. Patient also develops C. diff diagnosed 4-12.  C diff was not responding flagyl, and she was started on vancomycin 4-16. She also develops  run of SVT. Cardiology has been helping with management.    HPI/Subjective: Diarrhea improving; no CP and no vomiting. Patient not feeling to good. Reports SOB and worsening abd pain. Positive exp wheezing on exam. Appetite remains to be very poor.  Assessment/Plan:  C. difficile colitis C diff positive on 4-12.  Received 4 days of flagyl without improvement.  Continue with  Vancomycin, oral. Day 5  Acute hypoxic respiratory failure secondary to H1 N1 influenza +/- HCAP Stable/resolved Has completed antibiotics and resp is stable Still on zosyn for abd abscess  Some wheezing on exam and patient with mild SOB -will add pulmicort and continue using flutter valve/pulmonary hygiene    PSVT Has been evaluated by EP - TSH is slightly elevated; will check Free T4 and T3 Cardiology following - follow K+ and Mg  Continue with metoprolol and amiodarone; cardiology adjusting medications.  2-D echo to be repeated, with concerns for pericardial rub  Small PFO Has been evaluated by Cardiology No need for closure currently  ACA CVA with Lt sided  weakness Will need ongoing long term rehab - PT/OT to cont to follow while inpatient  ESRD new start HD this admit  Ongoing hemodialysis per Nephrology - perm-cath placed 3/26 d/t AVF problems Electrolytes abnormalities corrected with dialysis.  Patient to have AV fistula reviewed at some point for permanent access (most likely in outpatient setting) .  Status post exploratory laparotomy with closure of perforated pyloric ulcer/abdominal abscess / small bowel resection w/ postop ileus General Surgery continues to follow; CT today to evaluate on abscess  now off TNA and with calorie counting/regular diet Appetite is poor Complaining of nausea, but not vomiting  Pelvic abscess status post percutaneous drain placement 4/8 Ongoing care per general surgery and IR Day 22 zosyn.  Received  18 days of vancomycin and  18 days  of fluconazole.  Body fluid grew lactobacillus species.  Plan to repeat ct scan today 4/21 Will follow recommendations from CCS  Chronic diastolic congestive heart failure w/ Severe RHF EF 60-65%, grade 1 diastolic dysfunction. Appears compensated.  Fluids/volume manage with hemodialysis.   Anemia of critical illness and chronic disease Hgb presently stable s/p 2U PRBC 4/13 - follow  HB remains stable.  Aranesp/iron per renal discretion   Thrombocytopenia Appears to be associated with sepsis plt count now climbing again  follow trend No signs of overt bleeding  Asthma/COPD Patient with mild wheezing on exam Continue current medication regimen Will add pulmicort  Diabetes mellitus 2 CBG reasonably controlled - cont to follow  No further Hypoglycemia  Patient eating poorly  Abnormal TSH -Free T4 WNL -T3 pending -most likely sick thyroid with ongoing infection and body stress; TSH in 7 range -if T3 abnormal will start low dose synthroid given PSVT  Obesity - Body mass index is 32.89 kg/(m^2).  Code Status: FULL Family Communication: no family present  at time of exam Disposition Plan:  continue PT/OT - will need long-term rehabilitation stay in hemodialysis capable nursing home   Consultants: Cardiology Nephrology PCCM Gen Surgery   Significant Events: 3/16 influenza A+ 3/18 R ACA stroke 3/19 MRI multifocal acute infarction of both hemispheres, large right distal ACA 3/22 TEE severe RAE, severely reduced RV function, severe TR, no LAA thrombus, small PFO on TEE 3/31 to OR ex lap for per pyloric ulcer and sm bowel resection 4/1 septic shock on pressors 4/3 pressors weaned off  4/4 restart pressors, CRRT 4/6 levo switched to neo 4/7 pelvic abscess on CT 4/8 IR placed pelvic drain 4/9 off pressors 4/11 long run of SVT.  Antibiotics: Zosyn > Oral Flagyl 4/13 >  DVT prophylaxis: SCDs  Objective: Blood pressure 115/69, pulse 84, temperature 97.2 F (36.2 C), temperature source Oral, resp. rate 18, height 5\' 6"  (1.676 m), weight 92.4 kg (203 lb 11.3 oz), SpO2 96 %.  Intake/Output Summary (Last 24 hours) at 07/23/14 1842 Last data filed at 07/23/14 1839  Gross per 24 hour  Intake   1740 ml  Output    254 ml  Net   1486 ml   Exam: General: with plans for HD today; patient complaining of abd pain and with SOB. Mild exp wheesing on exam. Feeling nauseated, no vomiting. Apetite still poor Lungs: scattered rhonchi, positive exp wheezing, no crackles  Cardiovascular: Regular rate, no murmurs and no gallops   Abdomen: Abdominal wound dressing, clean and dry, abdomen nondistended, drain intact and w/o drainage; patient reports pain with deep palpation; no guarding Extremities: No significant cyanosis, clubbing, edema bilateral lower extremities  Data Reviewed: Basic Metabolic Panel:  Recent Labs Lab 07/17/14 0410 07/19/14 0535 07/20/14 1040 07/21/14 0508 07/22/14 0450  NA 126* 131* 134* 138 137  K 5.3* 3.9 4.1 3.7 3.8  CL 94* 95* 96 98 98  CO2 16* 19 20 25 22   GLUCOSE 70 96 116* 94 138*  BUN 86* 57* 65* 26* 34*   CREATININE 4.93* 5.17* 6.48* 3.89* 5.40*  CALCIUM 7.8* 7.6* 7.9* 7.8* 8.0*  MG 1.9  --   --   --  1.9  PHOS  --   --  7.9* 4.6  --     Liver Function Tests:  Recent Labs Lab 07/17/14 0410 07/20/14 1040 07/21/14 0508 07/22/14 0450  AST 21  --   --  25  ALT 37*  --   --  23  ALKPHOS 119*  --   --  136*  BILITOT 1.7*  --   --  1.1  PROT 5.9*  --   --  6.0  ALBUMIN 1.6* 1.6* 1.7* 1.7*   CBC:  Recent Labs Lab 07/17/14 0410 07/19/14 0535 07/20/14 1040 07/21/14 0508 07/22/14 0840  WBC 10.6* 10.1 10.0 9.3 8.6  HGB 9.6* 9.1* 9.2* 9.3* 9.2*  HCT 29.2* 27.9* 28.7* 29.2* 28.4*  MCV 84.9 86.4 87.2 88.2 87.7  PLT 79* 136* 196 225 264    CBG:  Recent Labs Lab 07/22/14 1646 07/22/14 2044 07/23/14 0812 07/23/14 1117 07/23/14 1649  GLUCAP 84 120* 103* 106* 111*    Recent Results (from the past 240 hour(s))  Clostridium Difficile by PCR     Status: Abnormal   Collection Time: 07/14/14 10:15 PM  Result Value Ref Range Status   C difficile by pcr POSITIVE (A) NEGATIVE Final    Comment: CRITICAL RESULT CALLED TO, READ BACK BY AND VERIFIED WITH: EKatrinka Blazing RN 9:45 07/15/14 ([email or web address])      Scheduled Meds:  Scheduled Meds: . amiodarone  200 mg Oral BID  . budesonide (PULMICORT) nebulizer solution  0.25 mg Nebulization BID  . darbepoetin (ARANESP) injection - DIALYSIS  150 mcg Intravenous Q Mon-HD  . feeding supplement (NEPRO CARB STEADY)  237 mL Oral BID BM  . feeding supplement (PRO-STAT SUGAR FREE 64)  30 mL Oral TID BM  . feeding supplement (RESOURCE BREEZE)  1 Container Oral Q1500  . insulin aspart  0-9 Units Subcutaneous TID WC  . ipratropium  0.5 mg Nebulization TID  . levalbuterol  0.63 mg Nebulization TID  . metoprolol tartrate  25 mg Oral BID  . midodrine  10 mg Oral BID WC  . piperacillin-tazobactam (ZOSYN)  IV  2.25 g Intravenous 3 times per day  . sodium chloride  10-40 mL Intracatheter Q12H  . vancomycin  125 mg Oral 4 times per day    Time spent on  care of this patient: 35 mins   Vassie Loll , MD  [phone] [facility] Office  [phone] Pager - Text Page per Amion as per below:  On-Call/Text Page:      [email or web address]      password [email or web address]  If 7PM-7AM, please contact night-coverage [email or web address] Password [email or web address] 07/23/2014, 6:42 PM   LOS: 36 days

## 2014-07-23 NOTE — Consult Note (Signed)
WOC wound consult note Reason for Consult: Consult requested to apply Vac dressing to abd wound.  Pt is followed by [facility] team for assessment and plan of care. Wound type: Full thickness post-op wound Measurement: 19X6X3cm Wound bed: Beefy red interspersed with yellow adipose tissue Drainage (amount, consistency, odor) Small amt yellow drainage, no odor Periwound: Intact skin surrounding Dressing procedure/placement/frequency: Applied one piece black foam to 125mm cont suction.  Pt tolerated with minimal amt discomfort.  Plan for bedside nurse to change Q Tues/Thurs/Sat. Please re-consult if further assistance is needed.  Thank-you,  Cammie Mcgeeawn Leler Brion MSN, RN, CWOCN, [place]CN-AP, CNS [phone]

## 2014-07-24 ENCOUNTER — Inpatient Hospital Stay (HOSPITAL_COMMUNITY): Payer: Medicaid Other

## 2014-07-24 DIAGNOSIS — R0602 Shortness of breath: Secondary | ICD-10-CM

## 2014-07-24 LAB — CBC
HEMATOCRIT: 29.4 % — AB (ref 36.0–46.0)
HEMOGLOBIN: 9.1 g/dL — AB (ref 12.0–15.0)
MCH: 28.6 pg (ref 26.0–34.0)
MCHC: 31 g/dL (ref 30.0–36.0)
MCV: 92.5 fL (ref 78.0–100.0)
Platelets: 308 10*3/uL (ref 150–400)
RBC: 3.18 MIL/uL — ABNORMAL LOW (ref 3.87–5.11)
RDW: 23 % — AB (ref 11.5–15.5)
WBC: 9.4 10*3/uL (ref 4.0–10.5)

## 2014-07-24 LAB — RENAL FUNCTION PANEL
ALBUMIN: 1.7 g/dL — AB (ref 3.5–5.2)
Anion gap: 14 (ref 5–15)
BUN: 25 mg/dL — ABNORMAL HIGH (ref 6–23)
CALCIUM: 8.2 mg/dL — AB (ref 8.4–10.5)
CO2: 25 mmol/L (ref 19–32)
CREATININE: 5.62 mg/dL — AB (ref 0.50–1.10)
Chloride: 96 mmol/L (ref 96–112)
GFR calc non Af Amer: 8 mL/min — ABNORMAL LOW (ref >90)
GFR, EST AFRICAN AMERICAN: 9 mL/min — AB (ref >90)
Glucose, Bld: 100 mg/dL — ABNORMAL HIGH (ref 70–99)
Phosphorus: 6.4 mg/dL — ABNORMAL HIGH (ref 2.3–4.6)
Potassium: 4.3 mmol/L (ref 3.5–5.1)
Sodium: 135 mmol/L (ref 135–145)

## 2014-07-24 LAB — T3, FREE: T3 FREE: 1.8 pg/mL — AB (ref 2.0–4.4)

## 2014-07-24 LAB — GLUCOSE, CAPILLARY
GLUCOSE-CAPILLARY: 83 mg/dL (ref 70–99)
GLUCOSE-CAPILLARY: 75 mg/dL (ref 70–99)
GLUCOSE-CAPILLARY: 59 mg/dL — AB (ref 70–99)
Glucose-Capillary: 83 mg/dL (ref 70–99)

## 2014-07-24 LAB — AMYLASE, PERITONEAL FLUID: Amylase, peritoneal fluid: <5 U/L

## 2014-07-24 MED ORDER — RENA-VITE PO TABS
1.0000 | ORAL_TABLET | Freq: Every day | ORAL | Status: DC
  Administered 2014-07-25 – 2014-07-28 (×4): 1 via ORAL
  Filled 2014-07-24 (×6): qty 1

## 2014-07-24 MED ORDER — IOHEXOL 300 MG/ML  SOLN
100.0000 mL | Freq: Once | INTRAMUSCULAR | Status: AC | PRN
  Administered 2014-07-24: 100 mL via INTRAVENOUS

## 2014-07-24 MED ORDER — LEVOTHYROXINE SODIUM 25 MCG PO TABS
25.0000 ug | ORAL_TABLET | Freq: Every day | ORAL | Status: DC
  Administered 2014-07-25 – 2014-08-02 (×9): 25 ug via ORAL
  Filled 2014-07-24 (×14): qty 1

## 2014-07-24 MED ORDER — IOHEXOL 300 MG/ML  SOLN: 25.0000 mL | INTRAMUSCULAR | Status: AC

## 2014-07-24 NOTE — Progress Notes (Signed)
[name], female   DOB: [birth year], 59 y.o.   MRN: [id number]     [facility]      [address]   [place], [place] [id number]    Phone: [phone] FAX: [phone]     Subjective: Drank contrast, hopefully CT will be done today.  150ml output from JP drain.  VAC is off.    Objective:  Vital signs:  Filed Vitals:   07/23/14 2012 07/23/14 2020 07/24/14 0449 07/24/14 0829  BP: 88/58  115/82   Pulse: 96  96   Temp: 98.2 F (36.8 C)  98.6 F (37 C)   TempSrc: Oral  Oral   Resp: 16  7   Height:      Weight: 94.6 kg (208 lb 8.9 oz)     SpO2: 93% 93% 96% 87%    Last BM Date: 07/23/14  Intake/Output   Yesterday:  04/21 0701 - 04/22 0700 In: 1620 [P.O.:1600; I.V.:20] Out: 153 [Drains:150; Stool:3] This shift:    I/O last 3 completed shifts: In: 1860 [P.O.:1840; I.V.:20] Out: 255 [Drains:250; Stool:5]     Physical Exam: General: Pt awake/alert/oriented x4 in no acute distress  Abdomen: Soft. Nondistended. abd drain with grayish output. VAC is off.   No evidence of peritonitis. No incarcerated hernias.    Problem List:   Principal Problem:   Acute respiratory failure with hypoxia Active Problems:   Cerebral thrombosis with cerebral infarction   Hypertension   ESRD needing dialysis   Obesity (BMI 30-39.9)   Depression   Left renal mass   Chronic diastolic heart failure   H1N1 influenza   Tobacco use disorder   Renal failure (ARF), acute on chronic   Hyperlipidemia   PSVT (paroxysmal supraventricular tachycardia)   Hypoxia   SOB (shortness of breath)   Perforated gastric ulcer   Pelvic fluid collection   Abdominal abscess   Enteritis due to Clostridium difficile   Intra-abdominal abscess   Abnormal TSH   Other emphysema    Results:   Labs: Results for orders placed or performed during the hospital encounter of 06/17/14 (from the past 48 hour(s))  Glucose, capillary     Status:  None   Collection Time: 07/22/14 12:27 PM  Result Value Ref Range   Glucose-Capillary 84 70 - 99 mg/dL  Glucose, capillary     Status: None   Collection Time: 07/22/14  1:09 PM  Result Value Ref Range   Glucose-Capillary 94 70 - 99 mg/dL  Glucose, capillary     Status: None   Collection Time: 07/22/14  4:46 PM  Result Value Ref Range   Glucose-Capillary 84 70 - 99 mg/dL   Comment 1 Repeat Test    Comment 2 Document in Chart   Glucose, capillary     Status: Abnormal   Collection Time: 07/22/14  8:44 PM  Result Value Ref Range   Glucose-Capillary 120 (H) 70 - 99 mg/dL  T4, free     Status: None   Collection Time: 07/23/14  4:40 AM  Result Value Ref Range   Free T4 1.07 0.80 - 1.80 ng/dL    Comment: Performed at [facility]  T3, free     Status: Abnormal   Collection Time: 07/23/14  4:40 AM  Result Value Ref Range   T3, Free 1.8 (L) 2.0 - 4.4 pg/mL    Comment: (NOTE) Performed At: [facility] [address] [place] [id number] Mila HomerHancock William F MD  JX:[id number]   TSH     Status: Abnormal   Collection Time: 07/23/14  4:40 AM  Result Value Ref Range   TSH 7.340 (H) 0.350 - 4.500 uIU/mL  Glucose, capillary     Status: Abnormal   Collection Time: 07/23/14  8:12 AM  Result Value Ref Range   Glucose-Capillary 103 (H) 70 - 99 mg/dL   Comment 1 Notify RN    Comment 2 Document in Chart   Glucose, capillary     Status: Abnormal   Collection Time: 07/23/14 11:17 AM  Result Value Ref Range   Glucose-Capillary 106 (H) 70 - 99 mg/dL   Comment 1 Notify RN    Comment 2 Document in Chart   Glucose, capillary     Status: Abnormal   Collection Time: 07/23/14  4:49 PM  Result Value Ref Range   Glucose-Capillary 111 (H) 70 - 99 mg/dL   Comment 1 Notify RN    Comment 2 Document in Chart   Glucose, capillary     Status: None   Collection Time: 07/24/14  7:59 AM  Result Value Ref Range   Glucose-Capillary 83 70 - 99 mg/dL    Imaging / Studies: No results  found.  Medications / Allergies:  Scheduled Meds: . amiodarone  200 mg Oral BID  . budesonide (PULMICORT) nebulizer solution  0.25 mg Nebulization BID  . darbepoetin (ARANESP) injection - DIALYSIS  150 mcg Intravenous Q Mon-HD  . feeding supplement (NEPRO CARB STEADY)  237 mL Oral BID BM  . feeding supplement (PRO-STAT SUGAR FREE 64)  30 mL Oral TID BM  . feeding supplement (RESOURCE BREEZE)  1 Container Oral Q1500  . insulin aspart  0-9 Units Subcutaneous TID WC  . ipratropium  0.5 mg Nebulization TID  . levalbuterol  0.63 mg Nebulization TID  . metoprolol tartrate  25 mg Oral BID  . midodrine  10 mg Oral BID WC  . piperacillin-tazobactam (ZOSYN)  IV  2.25 g Intravenous 3 times per day  . sodium chloride  10-40 mL Intracatheter Q12H  . vancomycin  125 mg Oral 4 times per day   Continuous Infusions:  PRN Meds:.sodium chloride, sodium chloride, sodium chloride, sodium chloride, anticoagulant sodium citrate, bisacodyl, feeding supplement (NEPRO CARB STEADY), feeding supplement (NEPRO CARB STEADY), fentaNYL (SUBLIMAZE) injection, heparin, heparin, HYDROcodone-acetaminophen, lidocaine (PF), lidocaine (PF), lidocaine-prilocaine, lidocaine-prilocaine, metoprolol, morphine injection, ondansetron (ZOFRAN) IV, pentafluoroprop-tetrafluoroeth, pentafluoroprop-tetrafluoroeth, sodium chloride  Antibiotics: Anti-infectives    Start     Dose/Rate Route Frequency Ordered Stop   07/18/14 1800  vancomycin (VANCOCIN) 50 mg/mL oral solution 125 mg     125 mg Oral 4 times per day 07/18/14 1455     07/17/14 1830  vancomycin (VANCOCIN) IVPB 750 mg/150 ml premix     750 mg 150 mL/hr over 60 Minutes Intravenous  Once 07/17/14 1818 07/18/14 0024   07/15/14 1400  metroNIDAZOLE (FLAGYL) tablet 500 mg  Status:  Discontinued     500 mg Oral 3 times per day 07/15/14 1202 07/18/14 1533   07/13/14 2200  piperacillin-tazobactam (ZOSYN) IVPB 2.25 g     2.25 g 100 mL/hr over 30 Minutes Intravenous 3 times per day  07/13/14 1318     07/13/14 2000  fluconazole (DIFLUCAN) IVPB 200 mg  Status:  Discontinued     200 mg 100 mL/hr over 60 Minutes Intravenous Every 24 hours 07/13/14 1318 07/19/14 1027   07/07/14 1200  vancomycin (VANCOCIN) IVPB 1000 mg/200 mL premix  Status:  Discontinued  1,000 mg 200 mL/hr over 60 Minutes Intravenous Every 24 hours 07/07/14 0937 07/14/14 1712   07/06/14 2000  fluconazole (DIFLUCAN) IVPB 400 mg  Status:  Discontinued     400 mg 100 mL/hr over 120 Minutes Intravenous Every 24 hours 07/06/14 1507 07/13/14 1318   07/06/14 1800  piperacillin-tazobactam (ZOSYN) IVPB 2.25 g  Status:  Discontinued     2.25 g 100 mL/hr over 30 Minutes Intravenous 4 times per day 07/06/14 1505 07/13/14 1318   07/05/14 2000  fluconazole (DIFLUCAN) IVPB 200 mg  Status:  Discontinued     200 mg 100 mL/hr over 60 Minutes Intravenous Every 24 hours 07/05/14 0757 07/06/14 1507   07/05/14 1400  piperacillin-tazobactam (ZOSYN) IVPB 2.25 g  Status:  Discontinued     2.25 g 100 mL/hr over 30 Minutes Intravenous 3 times per day 07/05/14 0749 07/06/14 1505   07/02/14 1800  vancomycin (VANCOCIN) IVPB 1000 mg/200 mL premix  Status:  Discontinued     1,000 mg 200 mL/hr over 60 Minutes Intravenous Every 24 hours 07/01/14 1858 07/05/14 0801   07/01/14 2200  piperacillin-tazobactam (ZOSYN) IVPB 3.375 g  Status:  Discontinued     3.375 g 100 mL/hr over 30 Minutes Intravenous 4 times per day 07/01/14 1858 07/05/14 0749   07/01/14 2000  fluconazole (DIFLUCAN) IVPB 400 mg  Status:  Discontinued     400 mg 100 mL/hr over 120 Minutes Intravenous Every 24 hours 07/01/14 1858 07/05/14 0757   07/01/14 1400  fluconazole (DIFLUCAN) IVPB 200 mg  Status:  Discontinued     200 mg 100 mL/hr over 60 Minutes Intravenous Every 24 hours 07/01/14 1330 07/01/14 1853   07/01/14 1000  piperacillin-tazobactam (ZOSYN) IVPB 2.25 g  Status:  Discontinued     2.25 g 100 mL/hr over 30 Minutes Intravenous Every 8 hours 07/01/14 0952  07/01/14 1853   07/01/14 1000  vancomycin (VANCOCIN) 500 mg in sodium chloride 0.9 % 100 mL IVPB     500 mg 100 mL/hr over 60 Minutes Intravenous  Once 07/01/14 0952 07/01/14 1126   06/30/14 1200  vancomycin (VANCOCIN) IVPB 1000 mg/200 mL premix     1,000 mg 200 mL/hr over 60 Minutes Intravenous  Once 06/30/14 0944 06/30/14 1403   06/30/14 1200  ceFEPIme (MAXIPIME) 2 g in dextrose 5 % 50 mL IVPB     2 g 100 mL/hr over 30 Minutes Intravenous  Once 06/30/14 0944 06/30/14 1425   06/30/14 0100  vancomycin (VANCOCIN) 2,000 mg in sodium chloride 0.9 % 500 mL IVPB     2,000 mg 250 mL/hr over 120 Minutes Intravenous  Once 06/30/14 0048 06/30/14 0525   06/30/14 0100  ceFEPIme (MAXIPIME) 2 g in dextrose 5 % 50 mL IVPB     2 g 100 mL/hr over 30 Minutes Intravenous  Once 06/30/14 0048 06/30/14 0322   06/27/14 0600  cefUROXime (ZINACEF) 1.5 g in dextrose 5 % 50 mL IVPB     1.5 g 100 mL/hr over 30 Minutes Intravenous On call to O.R. 06/26/14 1019 06/27/14 0630   06/19/14 1800  oseltamivir (TAMIFLU) capsule 30 mg     30 mg Oral Every M-W-F (1800) 06/19/14 1003 06/22/14 1841   06/18/14 1600  oseltamivir (TAMIFLU) capsule 30 mg  Status:  Discontinued     30 mg Oral Daily 06/18/14 1538 06/19/14 1003        Assessment/Plan: POD #23 s/p EXPLORATORY LAPAROTOMY WITH CLOSURE OF PERFORATED PYLORIC ULCER, SMALL BOWEL RESECTION - 07/01/2014 - Derrell Lolling Intra-abdominal abscess -  IR drain was pulled out. Surgical drain--increased output, gray, yesterday -CT today to re-eval abscess -VAC is off, nurse doesn't know why.  Will ask WOC to evaluate -I&Os and routine drain care VTE prophylaxis-SCDs ID--zosyn D#[id number] for intra-abdominal abscess. Await CT before stopping C diff + -- Oral vanc FEN-tolerating POs, but appetite is poor Dispo-CT of abdomen and pelvis today  Ashok Norris, ANP-BC [facility] Pager [phone](7A-4:30P) For consults and floor pages call  [phone](7A-4:30P)  07/24/2014 9:06 AM

## 2014-07-24 NOTE — Progress Notes (Signed)
CT shows 6.6x3.4cm pre-rectal fluid collection.  D/w IR given no fever, wbc, Cx didn't yield anything.  No need for drain at this time.  Will stop her antibiotics and follow her clinically.  Aviel Davalos, ANP-BC

## 2014-07-24 NOTE — Progress Notes (Signed)
Informed by NP that pt's wound vac was off. Screen showed flashing low battery sign. Ordered a replacement cord and machine is now working. Confirmed by WOC. Will continue to monitor.   Carrie MewJasmine Rylin Seavey, RN

## 2014-07-24 NOTE — Progress Notes (Signed)
Progress Note [facility].   [name] Steve ZOX:[id number] DOB: 05-07-1955 DOA: 06/17/2014 PCP: [name], MD  Admit HPI / Brief Narrative: 59 yo female smoker with hx HTN, ESRD on HD (just began this admit), COPD, failed L AV fistula, initially admitted 3/16 with flu and acute hypoxic resp failure. Ultimately tx to [facility] due to need for HD. 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   Patient on 3/31 was taking to the OR for ex lap for per pyloric ulcer and sm bowel resection. Patient develops septic shock on 4-1, she was started on pressors and wean off pressors on 4-3. Subsequently she was diagnosed with pelvic abscess by CT scan perform on 4-7. She underwent pelvic drain placement by IR. Patient also develops [name]. diff diagnosed 4-12.  [name] diff was not responding flagyl, and she was started on vancomycin 4-16. She also develops  run of SVT. Cardiology has been helping with management.    HPI/Subjective: Diarrhea improving; no CP and no vomiting. Patient not feeling to good. Reports SOB and worsening abd pain. Positive exp wheezing on exam. Appetite remains to be very poor.  Assessment/Plan:  [name]. difficile colitis [name] diff positive on 4-12.  Received 4 days of flagyl without improvement.  Continue with  Vancomycin, oral. Day 6 Plan is to stop zosyn today Will count as day 1/14 for oral vanc  Acute hypoxic respiratory failure secondary to H1 N1 influenza +/- HCAP Stable/resolved Has completed antibiotics and resp is stable Still on zosyn for abd abscess  Some wheezing on exam and patient with mild SOB -will use pulmicort and continue using flutter valve/pulmonary hygiene    PSVT Has been evaluated by EP - TSH is slightly elevated; will check Free T4 and T3 Cardiology following - follow K+ and Mg  Continue with metoprolol and amiodarone; cardiology adjusting medications.  2-D echo to be repeated, with concerns for pericardial rub Echo demonstrated extremely high  right side pressure and moderately reduce function of RV Following cardiology rec's will check V/Q scan for evaluation for PE  Small PFO Has been evaluated by Cardiology No need for closure currently  ACA CVA with Lt sided weakness Will need ongoing long term rehab - PT/OT to cont to follow while inpatient  ESRD new start HD this admit  Ongoing hemodialysis per Nephrology - perm-cath placed 3/26 d/t AVF problems Electrolytes abnormalities corrected with dialysis.  Patient to have AV fistula reviewed at some point for permanent access (most likely in outpatient setting) .   Status post exploratory laparotomy with closure of perforated pyloric ulcer/abdominal abscess / small bowel resection w/ postop ileus General Surgery continues to follow; CT today to evaluate on abscess  now off TNA and with calorie counting/regular diet Appetite remains poor Complaining of nausea, but not vomiting  Pelvic abscess status post percutaneous drain placement 4/8 Ongoing care per general surgery and IR Day 23 of zosyn.  Received  18 days of vancomycin and  18 days  of fluconazole.  Body fluid grew lactobacillus species.  Plan to repeat ct scan today 4/22 (see below for results) Will follow recommendations from [facility]  Chronic diastolic congestive heart failure w/ Severe RHF EF 60-65%, grade 1 diastolic dysfunction. Appears compensated.  Fluids/volume manage with hemodialysis.   Anemia of critical illness and chronic disease Hgb presently stable s/p 2U PRBC 4/13 - follow  HB remains stable.  Aranesp/iron per renal discretion   Thrombocytopenia Appears to be associated with sepsis  plt count now climbing again  follow trend No signs of overt bleeding  Asthma/COPD Patient with mild wheezing on exam Continue current medication regimen Will continue pulmicort  Diabetes mellitus 2 CBG reasonably controlled - cont to follow  No further Hypoglycemic event Patient eating poorly  Abnormal  TSH -Free T4 WNL -T3 1.8 -most likely sick thyroid with ongoing infection and body stress; also due to amiodarone use. TSH in 7 range -will start low dose synthroid given PSVT  Obesity - Body mass index is 32.49 kg/(m^2).  Code Status: FULL Family Communication: no family present at time of exam Disposition Plan:  continue PT/OT - will need long-term rehabilitation stay in hemodialysis capable nursing home   Consultants: Cardiology Nephrology PCCM Gen Surgery   Significant Events: 3/16 influenza A+ 3/18 R ACA stroke 3/19 MRI multifocal acute infarction of both hemispheres, large right distal ACA 3/22 TEE severe RAE, severely reduced RV function, severe TR, no LAA thrombus, small PFO on TEE 3/31 to OR ex lap for per pyloric ulcer and sm bowel resection 4/1 septic shock on pressors 4/3 pressors weaned off  4/4 restart pressors, CRRT 4/6 levo switched to neo 4/7 pelvic abscess on CT 4/8 IR placed pelvic drain 4/9 off pressors 4/11 long run of SVT.  Antibiotics: Zosyn >stopped on 4/22 (received a total of 23 days of treatment ) Oral Flagyl 4/13 >  DVT prophylaxis: SCDs  Objective: Blood pressure 109/64, pulse 86, temperature 98 F (36.7 [name]), temperature source Oral, resp. rate 18, height  (1.676 m), weight 91.264 kg (201 lb 3.2 oz), SpO2 100 %.  Intake/Output Summary (Last 24 hours) at 07/24/14 2248 Last data filed at 07/24/14 1845  Gross per 24 hour  Intake    200 ml  Output      3 ml  Net    197 ml   Exam: General: with plans for HD today; patient feeling slightly better today; no CP and with improve breathing.  Lungs: scattered rhonchi, positive exp wheezing, no crackles  Cardiovascular: Regular rate, no murmurs and no gallops   Abdomen: Abdominal wound dressing, clean and dry, abdomen nondistended, drain intact and wit serous drainage; patient reports mild pain with deep palpation; no guarding Extremities: No significant cyanosis, clubbing, edema bilateral  lower extremities  Data Reviewed: Basic Metabolic Panel:  Recent Labs Lab 07/19/14 0535 07/20/14 1040 07/21/14 0508 07/22/14 0450 07/24/14 1419  NA 131* 134* 138 137 135  K 3.9 4.1 3.7 3.8 4.3  CL 95* 96 98 98 96  CO2 GLUCOSE 96 116* 94 138* 100*  BUN 57* 65* 26* 34* 25*  CREATININE 5.17* 6.48* 3.89* 5.40* 5.62*  CALCIUM 7.6* 7.9* 7.8* 8.0* 8.2*  MG  --   --   --  1.9  --   PHOS  --  7.9* 4.6  --  6.4*    Liver Function Tests:  Recent Labs Lab 07/20/14 1040 07/21/14 0508 07/22/14 0450 07/24/14 1419  AST  --   --  25  --   ALT  --   --  23  --   ALKPHOS  --   --  136*  --   BILITOT  --   --  1.1  --   PROT  --   --  6.0  --   ALBUMIN 1.6* 1.7* 1.7* 1.7*   CBC:  Recent Labs Lab 07/19/14 0535 07/20/14 1040 07/21/14 0508 07/22/14 0840 07/24/14 1418  WBC 10.1 10.0 9.3 8.6  9.4  HGB 9.1* 9.2* 9.3* 9.2* 9.1*  HCT 27.9* 28.7* 29.2* 28.4* 29.4*  MCV 86.4 87.2 88.2 87.7 92.5  PLT 136* 196 225 264 308    CBG:  Recent Labs Lab 07/23/14 1649 07/24/14 0759 07/24/14 1200 07/24/14 1816 07/24/14 2105  GLUCAP 111* 83 75 59* 83    Scheduled Meds:  Scheduled Meds: . amiodarone  200 mg Oral BID  . budesonide (PULMICORT) nebulizer solution  0.25 mg Nebulization BID  . darbepoetin (ARANESP) injection - DIALYSIS  150 mcg Intravenous Q Mon-HD  . feeding supplement (NEPRO CARB STEADY)  237 mL Oral BID BM  . feeding supplement (PRO-STAT SUGAR FREE 64)  30 mL Oral TID BM  . feeding supplement (RESOURCE BREEZE)  1 Container Oral Q1500  . insulin aspart  0-9 Units Subcutaneous TID WC  . ipratropium  0.5 mg Nebulization TID  . levalbuterol  0.63 mg Nebulization TID  . [START ON 07/25/2014] levothyroxine  25 mcg Oral QAC breakfast  . metoprolol tartrate  25 mg Oral BID  . midodrine  10 mg Oral BID WC  . multivitamin  1 tablet Oral QHS  . sodium chloride  10-40 mL Intracatheter Q12H  . vancomycin  125 mg Oral 4 times per day    Time spent on care of  this patient: 35 mins   Vassie LollMadera, Shanise Balch , MD  [phone] [facility] Office  [phone] Pager - Text Page per Amion as per below:  On-Call/Text Page:      [email or web address]      password [email or web address]  If 7PM-7AM, please contact night-coverage [email or web address] Password [email or web address] 07/24/2014, 10:48 PM   LOS: 37 days

## 2014-07-24 NOTE — Progress Notes (Signed)
Patient Name: [name] Date of Encounter: 07/24/2014   Principal Problem:   Acute respiratory failure with hypoxia Active Problems:   Cerebral thrombosis with cerebral infarction   Hypertension   ESRD needing dialysis   Obesity (BMI 30-39.9)   Depression   Left renal mass   Chronic diastolic heart failure   H1N1 influenza   Tobacco use disorder   Renal failure (ARF), acute on chronic   Hyperlipidemia   PSVT (paroxysmal supraventricular tachycardia)   Hypoxia   SOB (shortness of breath)   Perforated gastric ulcer   Pelvic fluid collection   Abdominal abscess   Enteritis due to Clostridium difficile   Intra-abdominal abscess   Abnormal TSH   Other emphysema    SUBJECTIVE  No SVT since yesterday afternoon ~ 12:50.  Just got back from CT.  Abd sore.  CURRENT MEDS . amiodarone  200 mg Oral BID  . budesonide (PULMICORT) nebulizer solution  0.25 mg Nebulization BID  . darbepoetin (ARANESP) injection - DIALYSIS  150 mcg Intravenous Q Mon-HD  . feeding supplement (NEPRO CARB STEADY)  237 mL Oral BID BM  . feeding supplement (PRO-STAT SUGAR FREE 64)  30 mL Oral TID BM  . feeding supplement (RESOURCE BREEZE)  1 Container Oral Q1500  . insulin aspart  0-9 Units Subcutaneous TID WC  . ipratropium  0.5 mg Nebulization TID  . levalbuterol  0.63 mg Nebulization TID  . metoprolol tartrate  25 mg Oral BID  . midodrine  10 mg Oral BID WC  . multivitamin  1 tablet Oral QHS  . sodium chloride  10-40 mL Intracatheter Q12H  . vancomycin  125 mg Oral 4 times per day    OBJECTIVE  Filed Vitals:   07/23/14 2020 07/24/14 0449 07/24/14 0829 07/24/14 0916  BP:  115/82  113/72  Pulse:  96  95  Temp:  98.6 F (37 C)  97.7 F (36.5 C)  TempSrc:  Oral  Oral  Resp:  7  15  Height:      Weight:      SpO2: 93% 96% 87% 97%    Intake/Output Summary (Last 24 hours) at 07/24/14 1151 Last data filed at 07/24/14 0934  Gross per 24 hour  Intake   1520 ml  Output     52 ml  Net    1468 ml   Filed Weights   07/22/14 1153 07/22/14 2045 07/23/14 2012  Weight: 203 lb 11.3 oz (92.4 kg) 203 lb 11.3 oz (92.4 kg) 208 lb 8.9 oz (94.6 kg)    PHYSICAL EXAM  General: Pleasant, NAD. Neuro: Alert and oriented X 3. Moves all extremities spontaneously. Psych: Flat affect. HEENT:  Normal  Neck: Supple without bruits or JVD. Lungs:  Resp regular and unlabored, CTA. Heart: RRR no s3, s4, or murmurs. Abdomen: Soft, diffusely tender, non-distended, BS + x 4.  Extremities: No clubbing, cyanosis or edema. DP/PT/Radials 1+ and equal bilaterally.  Accessory Clinical Findings  CBC  Recent Labs  07/22/14 0840  WBC 8.6  HGB 9.2*  HCT 28.4*  MCV 87.7  PLT 264   Basic Metabolic Panel  Recent Labs  07/22/14 0450  NA 137  K 3.8  CL 98  CO2 22  GLUCOSE 138*  BUN 34*  CREATININE 5.40*  CALCIUM 8.0*  MG 1.9   Liver Function Tests  Recent Labs  07/22/14 0450  AST 25  ALT 23  ALKPHOS 136*  BILITOT 1.1  PROT 6.0  ALBUMIN 1.7*   Thyroid  Function Tests  Recent Labs  07/23/14 0440  TSH 7.340*  T3FREE 1.8*    TELE  Brief run of SVT yesterday afternoon @ ~ 12:50 pm.  Sinus since.  Radiology/Studies  Ct Abdomen Pelvis W Contrast  07/24/2014   CLINICAL DATA:  Abdominal abscess.  EXAM: CT ABDOMEN AND PELVIS WITH CONTRAST  TECHNIQUE: Multidetector CT imaging of the abdomen and pelvis was performed using the standard protocol following bolus administration of intravenous contrast.  CONTRAST:  OMNIPAQUE IOHEXOL 300 MG/ML  SOLN  COMPARISON:  CT scan of July 16, 2014.  FINDINGS: Severe degenerative disc disease is noted at L5-S1. No significant abnormality is noted in the visualized lung bases.  No gallstones are noted. No focal abnormality is noted in the liver, spleen or pancreas. Stable surgical drain is seen entering epigastric region with tip in left upper quadrant. No fluid is noted around this drain. Adrenal glands appear normal. Stable left renal cyst is  noted. Mild right perinephric fluid is noted which is unchanged. No hydronephrosis or renal obstruction is noted. No renal or ureteral calculi are noted. Atherosclerotic calcifications of abdominal aorta and iliac arteries are noted. 3.1 cm infrarenal abdominal aortic aneurysm is noted. There is no evidence of bowel obstruction. Urinary bladder appears normal. Calcified degenerating fibroids are noted in the uterus. Ovaries appear normal.  Percutaneous trans gluteal drainage catheter noted on prior exam has been removed in the interval. Three rectal fluid collection identified on prior exam is slightly enlarged currently, measuring 6.6 x 3.4 cm. No significant adenopathy is noted.  IMPRESSION: 3.1 cm infrarenal abdominal aortic aneurysm is noted. Recommend followup by ultrasound in 3 years. This recommendation follows ACR consensus guidelines: White Paper of the ACR Incidental Findings Committee II on Vascular Findings. J Am Coll Radiol 2013; 10:789-794.  Surgical drain seen entering epigastric region with distal tip in left upper quadrant is unchanged in position. No fluid collection is noted around it.  Percutaneous transgluteal drainage catheter noted on prior exam has been removed. Pre-rectal fluid collection in pelvis seen on prior exam has slightly increased in size, currently measuring 6.6 x 3.4 cm.   Electronically Signed   By: Lupita Raider, M.D.   On: 07/24/2014 10:41   2D Echocardiogram 4.22.2016  Study Conclusions  - Left ventricle: The cavity size was normal. There was moderate   concentric hypertrophy. Systolic function was normal. The   estimated ejection fraction was in the range of 60% to 65%. Wall   motion was normal; there were no regional wall motion   abnormalities. Doppler parameters are consistent with abnormal   left ventricular relaxation (grade 1 diastolic dysfunction).   There was no evidence of elevated ventricular filling pressure by   Doppler parameters. - Ventricular  septum: The contour showed diastolic flattening and   systolic flattening consistent with right ventricular volume and   pressure overload . - Aortic valve: Trileaflet; mildly thickened, mildly calcified   leaflets. Transvalvular velocity was within the normal range.   There was no stenosis. There was no regurgitation. - Mitral valve: Mildly thickened leaflets . There was no   regurgitation. - Right ventricle: The cavity size was severely dilated. Wall   thickness was normal. Systolic function was mildly to moderately   reduced. - Right atrium: The atrium was severely dilated. - Tricuspid valve: There was moderate-severe regurgitation. - Pulmonic valve: There was no regurgitation. - Pulmonary arteries: The main pulmonary artery was normal-sized.   Systolic pressure was severely increased. PA peak  pressure: 90 mm   Hg (S). - Inferior vena cava: The vessel was normal in size. - Pericardium, extracardiac: There was no pericardial effusion. _____________   ASSESSMENT AND PLAN  59 year old female with history of SVT, ESRD on HD, HTN-now hypotensive on Proamitine, resolved acute hypoxemic respiratory failure secondary to the flu who was noted to have weakness on her left side during hospital stay with CT scan demonstrating acute infarct of right anterior cerebral artery territory. Small PFO noted by transthoracic echo, no DVT/SVT by dopplers 06/18/14 . TEE 06/23/14: no LAA thrombus, severe RAE; mild RVE; severely reduced RV function; severe TR; bowing of atrial septum right to left; positive saline microcavitation study She was seen by Dr. Anne Fu on 06/24/14 and felt to have SVT or atrial tachycardia but no atrial fibrillation. She was seen by Dr. Graciela Husbands on 06/25/14 and he recommended 30 day event monitor at time of discharge. Since then she has had a prolonged hospitalization since 06/17/14 complicated by perforated pyloric ulcer. She has had episodes of recurrence of SVT.   1.  PSVT:  Quiescent over  the past 23 hrs.  Cont amio.  2.  ESRD:  Per nephrology.  3.  Chronic diastolic chf/PAH:  Volume stable and managed by nephrology.  Echo yesterday done 2/2 concern for rub - nl EF, no effusion.  PASP 90 mmHg with RV dysfxn and mod-sev TR.  Of note PASP only 38 on 3/17 echo. With resp failure & d dimer of 7.28 on admission, SVT, RV dysfxn, do we need to be concerned about PE (no c/p, dyspnea resolved).  Will d/w Dr. Tresa Endo.  4.  Perforated bowel/pneumoperitoneum:  Per [facility].  No plan for drain.  Signed, Nicolasa Ducking NP   Patient seen and examined. Agree with assessment and plan. No chest pain. No further tachydysrhythmia since initiating amiodarone.  Echo yesterday demonstrates marked increase in PA pressure at 90 mm Hg c/w 38 mm. RV is severely dilated with moderate ly reduced fxn. Discussed eval for PE with Dr. Darrick Penna / ?V/Q scan to attempt to preserve what little renal fxn remains for initial assessment (mortality benefit) rather than CT.    Lennette Bihari, MD, [facility] 07/24/2014 12:31 PM

## 2014-07-24 NOTE — Procedures (Signed)
I was present at this session.  I have reviewed the session itself and made appropriate changes.  HD via PC,  bp 90-100.  tol well  Boris Engelmann L 4/22/20164:16 PM

## 2014-07-24 NOTE — Progress Notes (Signed)
[name] Progress Note  Assessment/Plan: 1. New ESRD - started HD 3/17, using R IJ catheter due to difficulties using AVF; she is not going to to well on HD long term 2. Vol excess - wt's down 92.4 kg, still +LE edema, cont vol reduction with HD 3.3 off Mon 3.4 off Wed,For HD 3. Dialysis access - AVF @ [facility] placed 01/28/14 by Dr. Darrick PennaFields, functioned poorly, revision pending as outpatient; using R IJ catheter placed by Dr. Hart RochesterLawson 3/26. Will need prior to d/c 4. Perforated pyloric ulcer / necrotic bowel - s/p repair of perforated ulcer & partial ileum resection 3/30, on Zosyn. New drainage from surgical drain (IR drain out);  5. Recurrent sepsis - with percutaneous drain (placed 4/8) of pelvic fluid 6. C diff colitis - on PO Vancomycin. 7. 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 8. Hypotension / R HF - on midodrine 9. Anemia - Hgb 9.2, s/p 2 U PRBCs 4/13, on Aranesp 150 mcg on Mon; Fe 5% on 3/18.- repeat Fe studies 10. Sec HPT - Ca 8  P 4.6, iPTH 338. 11. PCM - Alb 1.7, thin carb-mod diet.+ prostat and nepro - add vitamin- Need to be sure she does not miss meals due to care issues!! 12. Recurrent PSVT - per Cardiology likely PAT vs PJRT- cards noted pericardial rub - Echo ordered Mod-severe TR, normal LV systolic fx, severe R heart and severe pul HTN c/w cor pulmonale, no pericardial effusion 13. Hx H1N1 Influenza with resp failure 14. Disp - LTAC recommended but Medicaid will not pay for this - we need to be sure she can tolerate HD in a recliner prior to d/c toSNF  Sheffield SliderMartha B Bergman, PA-C [facility] Beeper [phone] 07/24/2014,8:17 AM  LOS: 37 days  I have seen and examined this patient and agree with the plan of care seen, eval,examined and course discussed with patient .  [name] L 07/24/2014, 12:30 PM   Subjective:   C/o back pain - has not eaten breakfast yet Objective Filed Vitals:   07/23/14 1728 07/23/14 2012 07/23/14 2020 07/24/14 0449   BP: 115/69 88/58  115/82  Pulse: 84 96  96  Temp: 97.2 F (36.2 C) 98.2 F (36.8 C)  98.6 F (37 C)  TempSrc: Oral Oral  Oral  Resp: 18 16  7   Height:      Weight:  94.6 kg (208 lb 8.9 oz)    SpO2: 96% 93% 93% 96%   Physical Exam - getting breathing treatment General: NAD facial mask on Heart:RRR 2/3 murmur Lungs: soft wheezes Abdomen: VAC midline/dependent edema Extremities: no LE edema; LUE + edema - swelling throughout Dialysis Access: right IJ left AVF + bruit(01/2014- poorly fx - perm cath placed 06/26/2013)  Dialysis Orders: new start, on MWF schedule here. Lowest wt here 90kg approx  Additional Objective Labs: Basic Metabolic Panel:  Recent Labs Lab 07/20/14 1040 07/21/14 0508 07/22/14 0450  NA 134* 138 137  K 4.1 3.7 3.8  CL 96 98 98  CO2 20 25 22   GLUCOSE 116* 94 138*  BUN 65* 26* 34*  CREATININE 6.48* 3.89* 5.40*  CALCIUM 7.9* 7.8* 8.0*  PHOS 7.9* 4.6  --    Liver Function Tests:  Recent Labs Lab 07/20/14 1040 07/21/14 0508 07/22/14 0450  AST  --   --  25  ALT  --   --  23  ALKPHOS  --   --  136*  BILITOT  --   --  1.1  PROT  --   --  6.0  ALBUMIN 1.6* 1.7* 1.7*   CBC:  Recent Labs Lab 07/19/14 0535 07/20/14 1040 07/21/14 0508 07/22/14 0840  WBC 10.1 10.0 9.3 8.6  HGB 9.1* 9.2* 9.3* 9.2*  HCT 27.9* 28.7* 29.2* 28.4*  MCV 86.4 87.2 88.2 87.7  PLT 136* 196 225 264   CBG:  Recent Labs Lab 07/22/14 2044 07/23/14 0812 07/23/14 1117 07/23/14 1649 07/24/14 0759  GLUCAP 120* 103* 106* 111* 83  Medications:   . amiodarone  200 mg Oral BID  . budesonide (PULMICORT) nebulizer solution  0.25 mg Nebulization BID  . darbepoetin (ARANESP) injection - DIALYSIS  150 mcg Intravenous Q Mon-HD  . feeding supplement (NEPRO CARB STEADY)  237 mL Oral BID BM  . feeding supplement (PRO-STAT SUGAR FREE 64)  30 mL Oral TID BM  . feeding supplement (RESOURCE BREEZE)  1 Container Oral Q1500  . insulin aspart  0-9 Units Subcutaneous TID WC  .  ipratropium  0.5 mg Nebulization TID  . levalbuterol  0.63 mg Nebulization TID  . metoprolol tartrate  25 mg Oral BID  . midodrine  10 mg Oral BID WC  . piperacillin-tazobactam (ZOSYN)  IV  2.25 g Intravenous 3 times per day  . sodium chloride  10-40 mL Intracatheter Q12H  . vancomycin  125 mg Oral 4 times per day

## 2014-07-25 ENCOUNTER — Inpatient Hospital Stay (HOSPITAL_COMMUNITY): Payer: Medicaid Other

## 2014-07-25 DIAGNOSIS — I150 Renovascular hypertension: Secondary | ICD-10-CM

## 2014-07-25 LAB — GLUCOSE, CAPILLARY
Glucose-Capillary: 106 mg/dL — ABNORMAL HIGH (ref 70–99)
Glucose-Capillary: 91 mg/dL (ref 70–99)
Glucose-Capillary: 80 mg/dL (ref 70–99)

## 2014-07-25 MED ORDER — TECHNETIUM TO 99M ALBUMIN AGGREGATED
6.0000 | Freq: Once | INTRAVENOUS | Status: AC | PRN
  Administered 2014-07-25: 6 via INTRAVENOUS

## 2014-07-25 MED ORDER — TECHNETIUM TC 99M DIETHYLENETRIAME-PENTAACETIC ACID: 40.0000 | Freq: Once | INTRAVENOUS | Status: AC | PRN

## 2014-07-25 MED ORDER — IPRATROPIUM BROMIDE 0.02 % IN SOLN
0.5000 mg | Freq: Two times a day (BID) | RESPIRATORY_TRACT | Status: DC
  Administered 2014-07-25 – 2014-07-28 (×7): 0.5 mg via RESPIRATORY_TRACT
  Filled 2014-07-25 (×9): qty 2.5

## 2014-07-25 MED ORDER — LEVALBUTEROL HCL 0.63 MG/3ML IN NEBU
0.6300 mg | INHALATION_SOLUTION | Freq: Two times a day (BID) | RESPIRATORY_TRACT | Status: DC
  Administered 2014-07-25 – 2014-07-28 (×7): 0.63 mg via RESPIRATORY_TRACT
  Filled 2014-07-25 (×15): qty 3

## 2014-07-25 NOTE — Progress Notes (Signed)
Patient Name: Teneisha C Corcino Date of Encounter: 07/25/2014   Principal Problem:   Acute respiratory failure with hypoxia Active Problems:   Cerebral thrombosis with cerebral infarction   Hypertension   ESRD needing dialysis   Obesity (BMI 30-39.9)   Depression   Left renal mass   Chronic diastolic heart failure   H1N1 influenza   Tobacco use disorder   Renal failure (ARF), acute on chronic   Hyperlipidemia   PSVT (paroxysmal supraventricular tachycardia)   Hypoxia   SOB (shortness of breath)   Perforated gastric ulcer   Pelvic fluid collection   Abdominal abscess   Enteritis due to Clostridium difficile   Intra-abdominal abscess   Abnormal TSH   Other emphysema    SUBJECTIVE  No SVT since yesterday afternoon ~ 12:50.  Just got back from CT.  Abd sore.  CURRENT MEDS . amiodarone  200 mg Oral BID  . budesonide (PULMICORT) nebulizer solution  0.25 mg Nebulization BID  . darbepoetin (ARANESP) injection - DIALYSIS  150 mcg Intravenous Q Mon-HD  . feeding supplement (NEPRO CARB STEADY)  237 mL Oral BID BM  . feeding supplement (PRO-STAT SUGAR FREE 64)  30 mL Oral TID BM  . feeding supplement (RESOURCE BREEZE)  1 Container Oral Q1500  . insulin aspart  0-9 Units Subcutaneous TID WC  . ipratropium  0.5 mg Nebulization TID  . levalbuterol  0.63 mg Nebulization TID  . levothyroxine  25 mcg Oral QAC breakfast  . metoprolol tartrate  25 mg Oral BID  . midodrine  10 mg Oral BID WC  . multivitamin  1 tablet Oral QHS  . sodium chloride  10-40 mL Intracatheter Q12H  . vancomycin  125 mg Oral 4 times per day    OBJECTIVE  Filed Vitals:   07/24/14 2100 07/25/14 0550 07/25/14 0752 07/25/14 0824  BP: 109/64 99/68  103/75  Pulse: 86 95  99  Temp: 98 F (36.7 C) 98.8 F (37.1 C)  97.8 F (36.6 C)  TempSrc: Oral Oral  Oral  Resp: 18 17    Height:      Weight: 201 lb 3.2 oz (91.264 kg)     SpO2: 100% 98% 98% 98%    Intake/Output Summary (Last 24 hours) at 07/25/14  0840 Last data filed at 07/25/14 0700  Gross per 24 hour  Intake    440 ml  Output    542 ml  Net   -102 ml   Filed Weights   07/22/14 2045 07/23/14 2012 07/24/14 2100  Weight: 203 lb 11.3 oz (92.4 kg) 208 lb 8.9 oz (94.6 kg) 201 lb 3.2 oz (91.264 kg)    PHYSICAL EXAM  General: Pleasant, NAD. Neuro: Alert and oriented X 3. Moves all extremities spontaneously. Psych: Flat affect. HEENT:  Normal  Neck: Supple without bruits or JVD. Lungs:  Resp regular and unlabored, CTA. Heart: RRR no s3, s4, or murmurs. Abdomen: Soft, diffusely tender, non-distended, BS + x 4.  Extremities: No clubbing, cyanosis or edema. DP/PT/Radials 1+ and equal bilaterally.  Accessory Clinical Findings  CBC  Recent Labs  07/24/14 1418  WBC 9.4  HGB 9.1*  HCT 29.4*  MCV 92.5  PLT 308   Basic Metabolic Panel  Recent Labs  07/24/14 1419  NA 135  K 4.3  CL 96  CO2 25  GLUCOSE 100*  BUN 25*  CREATININE 5.62*  CALCIUM 8.2*  PHOS 6.4*   Liver Function Tests  Recent Labs  07/24/14 1419  ALBUMIN 1.7*  Thyroid Function Tests  Recent Labs  07/23/14 0440  TSH 7.340*  T3FREE 1.8*    TELE  Brief run of SVT yesterday afternoon @ ~ 12:50 pm.  Sinus since. 4/23 - sinus tach at 104   Radiology/Studies  Ct Abdomen Pelvis W Contrast  07/24/2014   CLINICAL DATA:  Abdominal abscess.  EXAM: CT ABDOMEN AND PELVIS WITH CONTRAST  TECHNIQUE: Multidetector CT imaging of the abdomen and pelvis was performed using the standard protocol following bolus administration of intravenous contrast.  CONTRAST:  100mL OMNIPAQUE IOHEXOL 300 MG/ML  SOLN  COMPARISON:  CT scan of July 16, 2014.  FINDINGS: Severe degenerative disc disease is noted at L5-S1. No significant abnormality is noted in the visualized lung bases.  No gallstones are noted. No focal abnormality is noted in the liver, spleen or pancreas. Stable surgical drain is seen entering epigastric region with tip in left upper quadrant. No fluid is  noted around this drain. Adrenal glands appear normal. Stable left renal cyst is noted. Mild right perinephric fluid is noted which is unchanged. No hydronephrosis or renal obstruction is noted. No renal or ureteral calculi are noted. Atherosclerotic calcifications of abdominal aorta and iliac arteries are noted. 3.1 cm infrarenal abdominal aortic aneurysm is noted. There is no evidence of bowel obstruction. Urinary bladder appears normal. Calcified degenerating fibroids are noted in the uterus. Ovaries appear normal.  Percutaneous trans gluteal drainage catheter noted on prior exam has been removed in the interval. Three rectal fluid collection identified on prior exam is slightly enlarged currently, measuring 6.6 x 3.4 cm. No significant adenopathy is noted.  IMPRESSION: 3.1 cm infrarenal abdominal aortic aneurysm is noted. Recommend followup by ultrasound in 3 years. This recommendation follows ACR consensus guidelines: White Paper of the ACR Incidental Findings Committee II on Vascular Findings. J Am Coll Radiol 2013; 10:789-794.  Surgical drain seen entering epigastric region with distal tip in left upper quadrant is unchanged in position. No fluid collection is noted around it.  Percutaneous transgluteal drainage catheter noted on prior exam has been removed. Pre-rectal fluid collection in pelvis seen on prior exam has slightly increased in size, currently measuring 6.6 x 3.4 cm.   Electronically Signed   By: Lupita RaiderJames  Green Jr, M.D.   On: 07/24/2014 10:41   2D Echocardiogram 4.22.2016  Study Conclusions  - Left ventricle: The cavity size was normal. There was moderate   concentric hypertrophy. Systolic function was normal. The   estimated ejection fraction was in the range of 60% to 65%. Wall   motion was normal; there were no regional wall motion   abnormalities. Doppler parameters are consistent with abnormal   left ventricular relaxation (grade 1 diastolic dysfunction).   There was no evidence of  elevated ventricular filling pressure by   Doppler parameters. - Ventricular septum: The contour showed diastolic flattening and   systolic flattening consistent with right ventricular volume and   pressure overload . - Aortic valve: Trileaflet; mildly thickened, mildly calcified   leaflets. Transvalvular velocity was within the normal range.   There was no stenosis. There was no regurgitation. - Mitral valve: Mildly thickened leaflets . There was no   regurgitation. - Right ventricle: The cavity size was severely dilated. Wall   thickness was normal. Systolic function was mildly to moderately   reduced. - Right atrium: The atrium was severely dilated. - Tricuspid valve: There was moderate-severe regurgitation. - Pulmonic valve: There was no regurgitation. - Pulmonary arteries: The main pulmonary artery was normal-sized.  Systolic pressure was severely increased. PA peak pressure: 90 mm   Hg (S). - Inferior vena cava: The vessel was normal in size. - Pericardium, extracardiac: There was no pericardial effusion. _____________   ASSESSMENT AND PLAN  59 year old female with history of SVT, ESRD on HD, HTN-now hypotensive on Proamitine, resolved acute hypoxemic respiratory failure secondary to the flu who was noted to have weakness on her left side during hospital stay with CT scan demonstrating acute infarct of right anterior cerebral artery territory. Small PFO noted by transthoracic echo, no DVT/SVT by dopplers 06/18/14 . TEE 06/23/14: no LAA thrombus, severe RAE; mild RVE; severely reduced RV function; severe TR; bowing of atrial septum right to left; positive saline microcavitation study She was seen by Dr. Anne Fu on 06/24/14 and felt to have SVT or atrial tachycardia but no atrial fibrillation. She was seen by Dr. Graciela Husbands on 06/25/14 and he recommended 30 day event monitor at time of discharge. Since then she has had a prolonged hospitalization since 06/17/14 complicated by perforated pyloric  ulcer. She has had episodes of recurrence of SVT.   1.  PSVT:  Quiescent over the past 23 hrs.  Cont amio.  2.  ESRD:  Per nephrology.  3.  Chronic diastolic chf/PAH:  Volume stable and managed by nephrology.  Echo yesterday done 2/2 concern for rub - nl EF, no effusion.  PASP 90 mmHg with RV dysfxn and mod-sev TR.  Of note PASP only 38 on 3/17 echo.   4.  Perforated bowel/pneumoperitoneum:  Per [facility].  No plan for drain.  5. Pulmonary HTN- acutely worse over the past month. Associated with an elevated d-dimer. VQ scan has been ordered.   No new recs   Vesta Mixer, Montez Hageman., MD, [facility] 07/25/2014, 8:43 AM [address],  [address] Office [phone] Pager [phone]

## 2014-07-25 NOTE — Progress Notes (Addendum)
Notified by CCMD that patient had 9 beat run of VT and 19 beat run of SVT. Pt asymptomatic, sitting up in bed eating breakfast.  VSS. Dr. Gwenlyn PerkingMadera notified. Will continue to monitor.   Carrie MewJasmine Lillion Elbert, RN

## 2014-07-25 NOTE — Progress Notes (Signed)
[facility] Progress Note  24 Days Post-Op  Subjective: Pt says she's in a lot of pain.  Nurses say she has no appetite and are encouraging her to eat.  She denies any N/V.  Having flatus, and BM's.    Objective: Vital signs in last 24 hours: Temp:  [97.6 F (36.4 C)-98.8 F (37.1 C)] 97.8 F (36.6 C) (04/23 0824) Pulse Rate:  [86-99] 99 (04/23 0824) Resp:  [16-25] 17 (04/23 0550) BP: (94-109)/(41-75) 103/75 mmHg (04/23 0824) SpO2:  [93 %-100 %] 98 % (04/23 0824) FiO2 (%):  [36 %] 36 % (04/22 1333) Weight:  [91.264 kg (201 lb 3.2 oz)] 91.264 kg (201 lb 3.2 oz) (04/22 2100) Last BM Date: 07/25/14  Intake/Output from previous day: 04/22 0701 - 04/23 0700 In: 440 [P.O.:420; I.V.:20] Out: 542 [Drains:540; Stool:2] Intake/Output this shift: Total I/O In: -  Out: 300 [Drains:300]  PE: Gen:  Alert, NAD, pleasant Abd: Soft, mildly tender, ND, +BS, no HSM, wound vac in place, drain with minimal serosanguinous with gray tinge drainage   Lab Results:   Recent Labs  07/24/14 1418  WBC 9.4  HGB 9.1*  HCT 29.4*  PLT 308   BMET  Recent Labs  07/24/14 1419  NA 135  K 4.3  CL 96  CO2 25  GLUCOSE 100*  BUN 25*  CREATININE 5.62*  CALCIUM 8.2*   PT/INR No results for input(s): LABPROT, INR in the last 72 hours. CMP     Component Value Date/Time   NA 135 07/24/2014 1419   NA 139 10/02/2012 1046   K 4.3 07/24/2014 1419   K 4.4 10/02/2012 1046   CL 96 07/24/2014 1419   CO2 25 07/24/2014 1419   CO2 25 10/02/2012 1046   GLUCOSE 100* 07/24/2014 1419   GLUCOSE 110 10/02/2012 1046   BUN 25* 07/24/2014 1419   BUN 35.2* 10/02/2012 1046   CREATININE 5.62* 07/24/2014 1419   CREATININE 2.5* 10/02/2012 1046   CALCIUM 8.2* 07/24/2014 1419   CALCIUM 9.8 10/02/2012 1046   PROT 6.0 07/22/2014 0450   PROT 8.4* 10/02/2012 1046   ALBUMIN 1.7* 07/24/2014 1419   ALBUMIN 3.1* 10/02/2012 1046   AST 25 07/22/2014 0450   AST 9 10/02/2012 1046   ALT 23 07/22/2014  0450   ALT <6 Repeated and Verified 10/02/2012 1046   ALKPHOS 136* 07/22/2014 0450   ALKPHOS 108 10/02/2012 1046   BILITOT 1.1 07/22/2014 0450   BILITOT 0.21 10/02/2012 1046   GFRNONAA 8* 07/24/2014 1419   GFRAA 9* 07/24/2014 1419   Lipase  No results found for: LIPASE     Studies/Results: Ct Abdomen Pelvis W Contrast  07/24/2014   CLINICAL DATA:  Abdominal abscess.  EXAM: CT ABDOMEN AND PELVIS WITH CONTRAST  TECHNIQUE: Multidetector CT imaging of the abdomen and pelvis was performed using the standard protocol following bolus administration of intravenous contrast.  CONTRAST:  OMNIPAQUE IOHEXOL 300 MG/ML  SOLN  COMPARISON:  CT scan of July 16, 2014.  FINDINGS: Severe degenerative disc disease is noted at L5-S1. No significant abnormality is noted in the visualized lung bases.  No gallstones are noted. No focal abnormality is noted in the liver, spleen or pancreas. Stable surgical drain is seen entering epigastric region with tip in left upper quadrant. No fluid is noted around this drain. Adrenal glands appear normal. Stable left renal cyst is noted. Mild right perinephric fluid is noted which is unchanged. No hydronephrosis or renal obstruction is noted. No renal or ureteral  calculi are noted. Atherosclerotic calcifications of abdominal aorta and iliac arteries are noted. 3.1 cm infrarenal abdominal aortic aneurysm is noted. There is no evidence of bowel obstruction. Urinary bladder appears normal. Calcified degenerating fibroids are noted in the uterus. Ovaries appear normal.  Percutaneous trans gluteal drainage catheter noted on prior exam has been removed in the interval. Three rectal fluid collection identified on prior exam is slightly enlarged currently, measuring 6.6 x 3.4 cm. No significant adenopathy is noted.  IMPRESSION: 3.1 cm infrarenal abdominal aortic aneurysm is noted. Recommend followup by ultrasound in 3 years. This recommendation follows ACR consensus guidelines: White  Paper of the ACR Incidental Findings Committee II on Vascular Findings. J Am Coll Radiol 2013; 10:789-794.  Surgical drain seen entering epigastric region with distal tip in left upper quadrant is unchanged in position. No fluid collection is noted around it.  Percutaneous transgluteal drainage catheter noted on prior exam has been removed. Pre-rectal fluid collection in pelvis seen on prior exam has slightly increased in size, currently measuring 6.6 x 3.4 cm.   Electronically Signed   By: Lupita Raider, M.D.   On: 07/24/2014 10:41    Anti-infectives: Anti-infectives    Start     Dose/Rate Route Frequency Ordered Stop   07/18/14 1800  vancomycin (VANCOCIN) 50 mg/mL oral solution 125 mg     125 mg Oral 4 times per day 07/18/14 1455     07/17/14 1830  vancomycin (VANCOCIN) IVPB 750 mg/150 ml premix     750 mg 150 mL/hr over 60 Minutes Intravenous  Once 07/17/14 1818 07/18/14 0024   07/15/14 1400  metroNIDAZOLE (FLAGYL) tablet 500 mg  Status:  Discontinued     500 mg Oral 3 times per day 07/15/14 1202 07/18/14 1533   07/13/14 2200  piperacillin-tazobactam (ZOSYN) IVPB 2.25 g  Status:  Discontinued     2.25 g 100 mL/hr over 30 Minutes Intravenous 3 times per day 07/13/14 1318 07/24/14 1138   07/13/14 2000  fluconazole (DIFLUCAN) IVPB 200 mg  Status:  Discontinued     200 mg 100 mL/hr over 60 Minutes Intravenous Every 24 hours 07/13/14 1318 07/19/14 1027   07/07/14 1200  vancomycin (VANCOCIN) IVPB 1000 mg/200 mL premix  Status:  Discontinued     1,000 mg 200 mL/hr over 60 Minutes Intravenous Every 24 hours 07/07/14 0937 07/14/14 1712   07/06/14 2000  fluconazole (DIFLUCAN) IVPB 400 mg  Status:  Discontinued     400 mg 100 mL/hr over 120 Minutes Intravenous Every 24 hours 07/06/14 1507 07/13/14 1318   07/06/14 1800  piperacillin-tazobactam (ZOSYN) IVPB 2.25 g  Status:  Discontinued     2.25 g 100 mL/hr over 30 Minutes Intravenous 4 times per day 07/06/14 1505 07/13/14 1318   07/05/14 2000   fluconazole (DIFLUCAN) IVPB 200 mg  Status:  Discontinued     200 mg 100 mL/hr over 60 Minutes Intravenous Every 24 hours 07/05/14 0757 07/06/14 1507   07/05/14 1400  piperacillin-tazobactam (ZOSYN) IVPB 2.25 g  Status:  Discontinued     2.25 g 100 mL/hr over 30 Minutes Intravenous 3 times per day 07/05/14 0749 07/06/14 1505   07/02/14 1800  vancomycin (VANCOCIN) IVPB 1000 mg/200 mL premix  Status:  Discontinued     1,000 mg 200 mL/hr over 60 Minutes Intravenous Every 24 hours 07/01/14 1858 07/05/14 0801   07/01/14 2200  piperacillin-tazobactam (ZOSYN) IVPB 3.375 g  Status:  Discontinued     3.375 g 100 mL/hr over 30 Minutes Intravenous  4 times per day 07/01/14 1858 07/05/14 0749   07/01/14 2000  fluconazole (DIFLUCAN) IVPB 400 mg  Status:  Discontinued     400 mg 100 mL/hr over 120 Minutes Intravenous Every 24 hours 07/01/14 1858 07/05/14 0757   07/01/14 1400  fluconazole (DIFLUCAN) IVPB 200 mg  Status:  Discontinued     200 mg 100 mL/hr over 60 Minutes Intravenous Every 24 hours 07/01/14 1330 07/01/14 1853   07/01/14 1000  piperacillin-tazobactam (ZOSYN) IVPB 2.25 g  Status:  Discontinued     2.25 g 100 mL/hr over 30 Minutes Intravenous Every 8 hours 07/01/14 0952 07/01/14 1853   07/01/14 1000  vancomycin (VANCOCIN) 500 mg in sodium chloride 0.9 % 100 mL IVPB     500 mg 100 mL/hr over 60 Minutes Intravenous  Once 07/01/14 0952 07/01/14 1126   06/30/14 1200  vancomycin (VANCOCIN) IVPB 1000 mg/200 mL premix     1,000 mg 200 mL/hr over 60 Minutes Intravenous  Once 06/30/14 0944 06/30/14 1403   06/30/14 1200  ceFEPIme (MAXIPIME) 2 g in dextrose 5 % 50 mL IVPB     2 g 100 mL/hr over 30 Minutes Intravenous  Once 06/30/14 0944 06/30/14 1425   06/30/14 0100  vancomycin (VANCOCIN) 2,000 mg in sodium chloride 0.9 % 500 mL IVPB     2,000 mg 250 mL/hr over 120 Minutes Intravenous  Once 06/30/14 0048 06/30/14 0525   06/30/14 0100  ceFEPIme (MAXIPIME) 2 g in dextrose 5 % 50 mL IVPB     2 g 100  mL/hr over 30 Minutes Intravenous  Once 06/30/14 0048 06/30/14 0322   06/27/14 0600  cefUROXime (ZINACEF) 1.5 g in dextrose 5 % 50 mL IVPB     1.5 g 100 mL/hr over 30 Minutes Intravenous On call to O.R. 06/26/14 1019 06/27/14 0630   06/19/14 1800  oseltamivir (TAMIFLU) capsule 30 mg     30 mg Oral Every M-W-F (1800) 06/19/14 1003 06/22/14 1841   06/18/14 1600  oseltamivir (TAMIFLU) capsule 30 mg  Status:  Discontinued     30 mg Oral Daily 06/18/14 1538 06/19/14 1003       Assessment/Plan POD #24 s/p Exploratory Laparotomy with graham patch closure of perforated pyloric ulcer, SBR - 07/01/2014 - Ingram Intra-abdominal abscess -IR drain was pulled out accidentally. Surgical drain--increased output (51240mL/24hr), more serosanguinous than gray -CT 07/24/14 showed 6.6x3.4cm pre-rectal fluid collection. D/w IR given no fever, wbc, Cx didn't yield anything. Drain is high output, but consider discontinuing this weekend.  Will stop her antibiotics and follow her clinically. -VAC back on MWF -I&Os and routine drain care  VTE prophylaxis-SCDs ID--Zosyn stopped on day #23 (07/24/14) C diff + -- Oral vanc New ESRD PCM- tolerating POs, but appetite is poor Dispo-continue inpatient, d/c to SNF when she can tolerate HD in a recliner per nephrology and she can obtain enough nutrition.    LOS: 38 days    DORT, Aundra MilletMEGAN 07/25/2014, 10:32 AM Pager: [phone]

## 2014-07-25 NOTE — Progress Notes (Signed)
[facility] Progress Note  Assessment/Plan: 1. New ESRD - on MWF schedule here; started HD 3/17, using R IJ catheter due to difficulties using AVF; she is not going to do well on HD long term 2. Vol excess -  cont vol reduction with HD 3.3 off Mon 3.4 off Wed, -had treatment Friday but nothing recorded in terms of pre/post wt and UF amount etc. Improving overall; mostly dependent central edema 3. Dialysis access - AVF @ [facility] placed 01/28/14 by Dr. Darrick Penna, functioned poorly, revision pending as outpatientvs prior to d/c; using R IJ catheter placed by Dr. Hart Rochester 3/26.  4. Perforated pyloric ulcer / necrotic bowel - s/p repair of perforated ulcer & partial ileum resection 3/30, off Zosyn. Per surgery 5. Recurrent sepsis - with percutaneous drain (placed 4/8) of pelvic fluid; repeat abdominal CT 4/22 showed no fluid collection at drain site, but some ^ in collection at prior transglut perc site 6. C diff colitis - on PO Vancomycin. 7. 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 8. Hypotension /right heart failure - on midodrine 9. Anemia - Hgb 9.1, s/p 2 U PRBCs 4/13, on Aranesp 150 mcg on Mon; Fe 5% on 3/18.- repeat Fe studies next HD(Mon) 10. Sec HPT - Ca 8 P 4.6, iPTH 338. 11. PCM - Alb 1.7, diet liberalized to regular+ prostat and nepro - add vitamin- Need to be sure she does not miss meals due to care issues!! Needs a lot of encouragement 12. 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 Hx H1N1 Influenza with resp failure 14. Disp - LTAC recommended but Medicaid will not pay for this - we need to be sure she can tolerate HD in a recliner prior to d/c to SNF  [name], PA-C  [facility] Beeper [phone] 07/25/2014,8:59 AM  LOS: 38 days   Pt seen, examined and agree w A/P as above.  [name] MD pager [phone]    cell [phone] 07/25/2014, 3:47 PM    Subjective:   Denies diarrhea, just no appetite. Drinking [place]- ate NO breakfast  Objective Filed Vitals:   07/24/14 2100 07/25/14 0550 07/25/14 0752 07/25/14 0824  BP: 109/64 99/68  103/75  Pulse: 86 95  99  Temp: 98 F (36.7 C) 98.8 F (37.1 C)  97.8 F (36.6 C)  TempSrc: Oral Oral  Oral  Resp: 18 17    Height:      Weight: 91.264 kg (201 lb 3.2 oz)     SpO2: 100% 98% 98% 98%   Physical Exam General: Heart: Lungs: Abdomen: VAC midline Extremities: Dialysis Access: right IJ (06/27/14) and left AVF + bruit placed 01/2014 - poorly fx  Dialysis Orders: new start, on MWF schedule here. Lowest wt here 90kg approx   Additional Objective Labs: Basic Metabolic Panel:  Recent Labs Lab 07/20/14 1040 07/21/14 0508 07/22/14 0450 07/24/14 1419  NA 134* 138 137 135  K 4.1 3.7 3.8 4.3  CL 96 98 98 96  CO2 GLUCOSE 116* 94 138* 100*  BUN 65* 26* 34* 25*  CREATININE 6.48* 3.89* 5.40* 5.62*  CALCIUM 7.9* 7.8* 8.0* 8.2*  PHOS 7.9* 4.6  --  6.4*   Liver Function Tests:  Recent Labs Lab 07/21/14 0508 07/22/14 0450 07/24/14 1419  AST  --  25  --  ALT  --  23  --   ALKPHOS  --  136*  --   BILITOT  --  1.1  --   PROT  --  6.0  --   ALBUMIN 1.7* 1.7* 1.7*   CBC:  Recent Labs Lab 07/19/14 0535 07/20/14 1040 07/21/14 0508 07/22/14 0840 07/24/14 1418  WBC 10.1 10.0 9.3 8.6 9.4  HGB 9.1* 9.2* 9.3* 9.2* 9.1*  HCT 27.9* 28.7* 29.2* 28.4* 29.4*  MCV 86.4 87.2 88.2 87.7 92.5  PLT 136* 196 225 264 308   Blood Culture    Component Value Date/Time   SDES BLOOD RIGHT HAND 07/13/2014 1600   SPECREQUEST BOTTLES DRAWN AEROBIC ONLY 2CC 07/13/2014 1600   CULT  07/13/2014 1600    NO GROWTH 5 DAYS Note: Culture results may be compromised due to an inadequate volume of blood received in culture bottles. Performed at [facility]    REPTSTATUS 07/20/2014 FINAL 07/13/2014 1600    CBG:  Recent Labs Lab 07/24/14 0759 07/24/14 1200 07/24/14 1816 07/24/14 2105 07/25/14 0819  GLUCAP 83 75 59* 83 106*    Studies/Results: Ct Abdomen Pelvis W Contrast  07/24/2014   CLINICAL DATA:  Abdominal abscess.  EXAM: CT ABDOMEN AND PELVIS WITH CONTRAST  TECHNIQUE: Multidetector CT imaging of the abdomen and pelvis was performed using the standard protocol following bolus administration of intravenous contrast.  CONTRAST:  OMNIPAQUE IOHEXOL 300 MG/ML  SOLN  COMPARISON:  CT scan of July 16, 2014.  FINDINGS: Severe degenerative disc disease is noted at L5-S1. No significant abnormality is noted in the visualized lung bases.  No gallstones are noted. No focal abnormality is noted in the liver, spleen or pancreas. Stable surgical drain is seen entering epigastric region with tip in left upper quadrant. No fluid is noted around this drain. Adrenal glands appear normal. Stable left renal cyst is noted. Mild right perinephric fluid is noted which is unchanged. No hydronephrosis or renal obstruction is noted. No renal or ureteral calculi are noted. Atherosclerotic calcifications of abdominal aorta and iliac arteries are noted. 3.1 cm infrarenal abdominal aortic aneurysm is noted. There is no evidence of bowel obstruction. Urinary bladder appears normal. Calcified degenerating fibroids are noted in the uterus. Ovaries appear normal.  Percutaneous trans gluteal drainage catheter noted on prior exam has been removed in the interval. Three rectal fluid collection identified on prior exam is slightly enlarged currently, measuring 6.6 x 3.4 cm. No significant adenopathy is noted.  IMPRESSION: 3.1 cm infrarenal abdominal aortic aneurysm is noted. Recommend followup by ultrasound in 3 years. This recommendation follows ACR consensus guidelines: White Paper of the ACR Incidental Findings Committee II on Vascular Findings. J Am Coll Radiol 2013; 10:789-794.  Surgical drain seen entering epigastric region  with distal tip in left upper quadrant is unchanged in position. No fluid collection is noted around it.  Percutaneous transgluteal drainage catheter noted on prior exam has been removed. Pre-rectal fluid collection in pelvis seen on prior exam has slightly increased in size, currently measuring 6.6 x 3.4 cm.   Electronically Signed   By: Lupita Raider, M.D.   On: 07/24/2014 10:41   Medications:   . amiodarone  200 mg Oral BID  . budesonide (PULMICORT) nebulizer solution  0.25 mg Nebulization BID  . darbepoetin (ARANESP) injection - DIALYSIS  150 mcg Intravenous Q Mon-HD  . feeding supplement (NEPRO CARB STEADY)  237 mL Oral BID BM  . feeding supplement (PRO-STAT SUGAR FREE 64)  30 mL Oral  TID BM  . feeding supplement (RESOURCE BREEZE)  1 Container Oral Q1500  . insulin aspart  0-9 Units Subcutaneous TID WC  . ipratropium  0.5 mg Nebulization TID  . levalbuterol  0.63 mg Nebulization TID  . levothyroxine  25 mcg Oral QAC breakfast  . metoprolol tartrate  25 mg Oral BID  . midodrine  10 mg Oral BID WC  . multivitamin  1 tablet Oral QHS  . sodium chloride  10-40 mL Intracatheter Q12H  . vancomycin  125 mg Oral 4 times per day

## 2014-07-25 NOTE — Progress Notes (Signed)
[facility] Note [facility].   Rickie C Lynds [id number] DOB: [birth year] DOA: 06/17/2014 PCP: Willey BladeEAN, [name], MD  Admit HPI / Brief Narrative35: 59 yo female smoker with hx HTN, ESRD on HD (just began this admit), COPD, failed L AV fistula, initially admitted 3/16 with flu and acute hypoxic resp failure. Ultimately tx to [facility] due to need for HD. 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   Patient on 3/31 was taking to the OR for ex lap for per pyloric ulcer and sm bowel resection. Patient develops septic shock on 4-1, she was started on pressors and wean off pressors on 4-3. Subsequently she was diagnosed with pelvic abscess by CT scan perform on 4-7. She underwent pelvic drain placement by IR. Patient also develops C. diff diagnosed 4-12.  C diff was not responding flagyl, and she was started on vancomycin 4-16. She also develops  run of SVT. Cardiology has been helping with management.    HPI/Subjective: Diarrhea improving; no CP and no vomiting. Improved breathing, still complaining of some discomfort in her abdomen  Assessment/Plan: C. difficile colitis C diff positive on 4-12.  Received 4 days of flagyl without improvement.  Will count as day 2/14 for oral vanc  Acute hypoxic respiratory failure secondary to H1 N1 influenza +/- HCAP Stable/resolved Has completed antibiotics and resp is stable Still on zosyn for abd abscess  Some wheezing on exam and patient with mild SOB -will use pulmicort and continue using flutter valve/pulmonary hygiene    PSVT Has been evaluated by EP - TSH is slightly elevated; will check Free T4 and T3 Cardiology following - follow K+ and Mg  Continue with metoprolol and amiodarone; cardiology adjusting medications.  2-D echo to be repeated, with concerns for pericardial rub Echo demonstrated extremely high right side pressure and moderately reduce function of RV Following cardiology rec's will check V/Q scan for evaluation for  PE  Small PFO Has been evaluated by Cardiology No need for closure currently  ACA CVA with Lt sided weakness Will need ongoing long term rehab - PT/OT to cont to follow while inpatient  ESRD new start HD this admit  Ongoing hemodialysis per Nephrology - perm-cath placed 3/26 d/t AVF problems Electrolytes abnormalities corrected with dialysis.  Patient to have AV fistula reviewed at some point for permanent access (most likely in outpatient setting) .  Status post exploratory laparotomy with closure of perforated pyloric ulcer/abdominal abscess / small bowel resection w/ postop ileus General Surgery continues to follow; CT today to evaluate on abscess  now off TNA and with calorie counting/regular diet Appetite remains poor Complaining of nausea, but not vomiting  Pelvic abscess status post percutaneous drain placement 4/8 Ongoing care per general surgery and IR Day 23 of zosyn.  Received  18 days of vancomycin and  18 days  of fluconazole.  Body fluid grew lactobacillus species.  Plan to repeat ct scan today 4/22 (see below for results) Will follow recommendations from [facility]  Chronic diastolic congestive heart failure w/ Severe RHF EF 60-65%, grade 1 diastolic dysfunction. Appears compensated.  Fluids/volume manage with hemodialysis.   Anemia of critical illness and chronic disease Hgb presently stable s/p 2U PRBC 4/13 - follow  HB remains stable.  Aranesp/iron per renal discretion   Thrombocytopenia Appears to be associated with sepsis plt count now within normal limits follow trend No signs of overt bleeding  Asthma/COPD Patient with mild wheezing on exam; but improved air movement Continue current  medication regimen and oxygen supplementation as needed Will continue pulmicort  Diabetes mellitus 2 CBG reasonably controlled - cont to follow  No further Hypoglycemic event Patient eating poorly; appetite is still not good  Abnormal TSH -Free T4 WNL -T3 1.8 -most  likely sick thyroid with ongoing infection and body stress; also due to amiodarone use. TSH in 7 range -will continue low dose synthroid given PSVT  Obesity - Body mass index is 32.49 kg/(m^2).  Code Status: FULL Family Communication: no family present at time of exam Disposition Plan:  continue PT/OT while inpatient as availability allows. Will need long-term rehabilitation at SNF; once able to tolerate HD on recliner and with improve nutrition.    Consultants: Cardiology Nephrology PCCM Gen Surgery   Significant Events: 3/16 influenza A+ 3/18 R ACA stroke 3/19 MRI multifocal acute infarction of both hemispheres, large right distal ACA 3/22 TEE severe RAE, severely reduced RV function, severe TR, no LAA thrombus, small PFO on TEE 3/31 to OR ex lap for per pyloric ulcer and sm bowel resection 4/1 septic shock on pressors 4/3 pressors weaned off  4/4 restart pressors, CRRT 4/6 levo switched to neo 4/7 pelvic abscess on CT 4/8 IR placed pelvic drain 4/9 off pressors 4/11 long run of SVT.  Antibiotics: Zosyn >stopped on 4/22 (received a total of 23 days of treatment ) Oral Flagyl 4/13 >  DVT prophylaxis: SCDs  Objective: Blood pressure 99/74, pulse 88, temperature 98.9 F (37.2 C), temperature source Oral, resp. rate 17, height  (1.676 m), weight 91.264 kg (201 lb 3.2 oz), SpO2 95 %.  Intake/Output Summary (Last 24 hours) at 07/25/14 1734 Last data filed at 07/25/14 1629  Gross per 24 hour  Intake    540 ml  Output    841 ml  Net   -301 ml   Exam: General: No fever; patient feeling ok; complaining of abdominal discomfort; no CP and with improve breathing. Patient has experienced nonsustained episode of V. tach Lungs: scattered rhonchi, decreased expiratory wheezes and improve her movement, no crackles  Cardiovascular: Regular rate, no murmurs and no gallops   Abdomen: Abdominal wound dressing, clean and dry, abdomen nondistended, drain intact and with serous  drainage; patient reports mild pain with deep palpation; no guarding Extremities: No significant cyanosis, clubbing, edema bilateral lower extremities  Data Reviewed: Basic Metabolic Panel:  Recent Labs Lab 07/19/14 0535 07/20/14 1040 07/21/14 0508 07/22/14 0450 07/24/14 1419  NA 131* 134* 138 137 135  K 3.9 4.1 3.7 3.8 4.3  CL 95* 96 98 98 96  CO2 GLUCOSE 96 116* 94 138* 100*  BUN 57* 65* 26* 34* 25*  CREATININE 5.17* 6.48* 3.89* 5.40* 5.62*  CALCIUM 7.6* 7.9* 7.8* 8.0* 8.2*  MG  --   --   --  1.9  --   PHOS  --  7.9* 4.6  --  6.4*    Liver Function Tests:  Recent Labs Lab 07/20/14 1040 07/21/14 0508 07/22/14 0450 07/24/14 1419  AST  --   --  25  --   ALT  --   --  23  --   ALKPHOS  --   --  136*  --   BILITOT  --   --  1.1  --   PROT  --   --  6.0  --   ALBUMIN 1.6* 1.7* 1.7* 1.7*   CBC:  Recent Labs Lab 07/19/14 0535 07/20/14 1040 07/21/14 0508 07/22/14 0840 07/24/14 1418  WBC 10.1 10.0 9.3 8.6 9.4  HGB 9.1* 9.2* 9.3* 9.2* 9.1*  HCT 27.9* 28.7* 29.2* 28.4* 29.4*  MCV 86.4 87.2 88.2 87.7 92.5  PLT 136* 196 225 264 308    CBG:  Recent Labs Lab 07/24/14 1200 07/24/14 1816 07/24/14 2105 07/25/14 0819 07/25/14 1628  GLUCAP 75 59* 83 106* 91    Scheduled Meds:  Scheduled Meds: . amiodarone  200 mg Oral BID  . budesonide (PULMICORT) nebulizer solution  0.25 mg Nebulization BID  . darbepoetin (ARANESP) injection - DIALYSIS  150 mcg Intravenous Q Mon-HD  . feeding supplement (NEPRO CARB STEADY)  237 mL Oral BID BM  . feeding supplement (PRO-STAT SUGAR FREE 64)  30 mL Oral TID BM  . feeding supplement (RESOURCE BREEZE)  1 Container Oral Q1500  . insulin aspart  0-9 Units Subcutaneous TID WC  . ipratropium  0.5 mg Nebulization BID  . levalbuterol  0.63 mg Nebulization BID  . levothyroxine  25 mcg Oral QAC breakfast  . metoprolol tartrate  25 mg Oral BID  . midodrine  10 mg Oral BID WC  . multivitamin  1 tablet Oral QHS  .  sodium chloride  10-40 mL Intracatheter Q12H  . vancomycin  125 mg Oral 4 times per day    Time spent on care of this patient: 35 mins   Vassie Loll , MD  [phone] [facility] Office  [phone] Pager - Text Page per Amion as per below:  On-Call/Text Page:      [email or web address]      password [email or web address]  If 7PM-7AM, please contact night-coverage [email or web address] Password [facility] 07/25/2014, 5:34 PM   LOS: 38 days

## 2014-07-26 DIAGNOSIS — I509 Heart failure, unspecified: Secondary | ICD-10-CM

## 2014-07-26 DIAGNOSIS — Z9289 Personal history of other medical treatment: Secondary | ICD-10-CM

## 2014-07-26 LAB — GLUCOSE, CAPILLARY
GLUCOSE-CAPILLARY: 95 mg/dL (ref 70–99)
Glucose-Capillary: 83 mg/dL (ref 70–99)
Glucose-Capillary: 55 mg/dL — ABNORMAL LOW (ref 70–99)
Glucose-Capillary: 65 mg/dL — ABNORMAL LOW (ref 70–99)
Glucose-Capillary: 140 mg/dL — ABNORMAL HIGH (ref 70–99)

## 2014-07-26 MED ORDER — DEXTROSE 50 % IV SOLN
INTRAVENOUS | Status: AC
  Administered 2014-07-26: 25 mL
  Filled 2014-07-26: qty 50

## 2014-07-26 NOTE — Progress Notes (Signed)
PROGRESS NOTE  Subjective:    59 y/o female with CKD and hypertension [facility] complaining of shortness of breath and generalized weakness x 2 weeks. Worse with exhertion, also described chest tightness during these times. She noted not taking her diuretic medications for majority of that time period. RLE edema was "bad" but has now resolved. She denied fever but noted some mild chills. In the ED she was noted to be hypoxemic and in what is believed to be acute on chronic renal failure. She was given lasix and renal and critical care medicine were consulted.    She is now on dialysis. Echo on 4/21 shows normal LV systolic function. Her RV is severely dilated and she has moderate reduction in RV function. VQ was Intermediate probability for pulmonary embolism    Objective:    Vital Signs:   Temp:  [97.5 F (36.4 C)-98.9 F (37.2 C)] 97.5 F (36.4 C) (04/24 0541) Pulse Rate:  [86-99] 86 (04/24 0541) Resp:  [16-17] 17 (04/24 0541) BP: (99-127)/(74-79) 113/77 mmHg (04/24 0541) SpO2:  [88 %-100 %] 91 % (04/24 0756)  Last BM Date: 07/25/14   24-hour weight change: Weight change:   Weight trends: Filed Weights   07/22/14 2045 07/23/14 2012 07/24/14 2100  Weight: 203 lb 11.3 oz (92.4 kg) 208 lb 8.9 oz (94.6 kg) 201 lb 3.2 oz (91.264 kg)    Intake/Output:  04/23 0701 - 04/24 0700 In: 420 [P.O.:420] Out: 605 [Drains:605] Total I/O In: -  Out: 125 [Drains:125]   Physical Exam: BP 113/77 mmHg  Pulse 86  Temp(Src) 97.5 F (36.4 C) (Oral)  Resp 17  Ht 5\' 6"  (1.676 m)  Wt 201 lb 3.2 oz (91.264 kg)  BMI 32.49 kg/m2  SpO2 91%  Wt Readings from Last 3 Encounters:  07/24/14 201 lb 3.2 oz (91.264 kg)  06/01/14 225 lb (102.059 kg)  01/28/14 230 lb (104.327 kg)    General: Vital signs reviewed and noted.    Head: Normocephalic, atraumatic.  Eyes: conjunctivae/corneas clear.  EOM's intact.   Throat: normal  Neck:  normal   Lungs:    clear     Heart:  RR  Abdomen:  Soft, non-tender, non-distended    Extremities: Obese, + BS    Neurologic: A&O X3, CN II - XII are grossly intact.   Psych: Normal     Labs: BMET:  Recent Labs  07/24/14 1419  NA 135  K 4.3  CL 96  CO2 25  GLUCOSE 100*  BUN 25*  CREATININE 5.62*  CALCIUM 8.2*  PHOS 6.4*    Liver function tests:  Recent Labs  07/24/14 1419  ALBUMIN 1.7*   No results for input(s): LIPASE, AMYLASE in the last 72 hours.  CBC:  Recent Labs  07/24/14 1418  WBC 9.4  HGB 9.1*  HCT 29.4*  MCV 92.5  PLT 308    Cardiac Enzymes: No results for input(s): CKTOTAL, CKMB, TROPONINI in the last 72 hours.  Coagulation Studies: No results for input(s): LABPROT, INR in the last 72 hours.  Other: Invalid input(s): POCBNP No results for input(s): DDIMER in the last 72 hours. No results for input(s): HGBA1C in the last 72 hours. No results for input(s): CHOL, HDL, LDLCALC, TRIG, CHOLHDL in the last 72 hours. No results for input(s): TSH, T4TOTAL, T3FREE, THYROIDAB in the last 72 hours.  Invalid input(s): FREET3 No results for input(s): VITAMINB12, FOLATE, FERRITIN, TIBC, IRON, RETICCTPCT in the last  72 hours.   Other results:  tele  ( personally reviewed )  -  NSR at 38  Medications:    Infusions:    Scheduled Medications: . amiodarone  200 mg Oral BID  . budesonide (PULMICORT) nebulizer solution  0.25 mg Nebulization BID  . darbepoetin (ARANESP) injection - DIALYSIS  150 mcg Intravenous Q Mon-HD  . feeding supplement (NEPRO CARB STEADY)  237 mL Oral BID BM  . feeding supplement (PRO-STAT SUGAR FREE 64)  30 mL Oral TID BM  . feeding supplement (RESOURCE BREEZE)  1 Container Oral Q1500  . insulin aspart  0-9 Units Subcutaneous TID WC  . ipratropium  0.5 mg Nebulization BID  . levalbuterol  0.63 mg Nebulization BID  . levothyroxine  25 mcg Oral QAC breakfast  . metoprolol tartrate  25 mg Oral BID  . midodrine  10 mg Oral BID WC  . multivitamin  1  tablet Oral QHS  . sodium chloride  10-40 mL Intracatheter Q12H  . vancomycin  125 mg Oral 4 times per day    Assessment/ Plan:   Principal Problem:   Acute respiratory failure with hypoxia Active Problems:   Cerebral thrombosis with cerebral infarction   Hypertension   ESRD needing dialysis   Obesity (BMI 30-39.9)   Depression   Left renal mass   Chronic diastolic heart failure   H1N1 influenza   Tobacco use disorder   Renal failure (ARF), acute on chronic   Hyperlipidemia   PSVT (paroxysmal supraventricular tachycardia)   Hypoxia   SOB (shortness of breath)   Perforated gastric ulcer   Pelvic fluid collection   Abdominal abscess   Enteritis due to Clostridium difficile   Intra-abdominal abscess   Abnormal TSH   Other emphysema   ESRD (end stage renal disease)    1. PSVT: Quiescent over the past 23 hrs. Cont amio. Tele shows NSR at 86 this am    2. ESRD: Per nephrology.  3. Chronic diastolic chf/PAH: Volume stable and managed by nephrology. Echo yesterday done 2/2 concern for rub - nl EF, no effusion. PASP 90 mmHg with RV dysfxn and mod-sev TR. Of note PASP only 38 on 3/17 echo.   4. Perforated bowel/pneumoperitoneum: Per GSU. No plan for drain.  5. Pulmonary HTN- acutely worse over the past month. Associated with an elevated d-dimer. VQ scan was intermediate prob.   Now that she is on HD, we may want to consider CT angio.  Will leave that up to Dr. Gwenlyn Perking.    Disposition:  Length of Stay: 65  Vesta Mixer, Montez Hageman., MD, [facility] 07/26/2014, 8:12 AM Office [phone] Pager [phone]

## 2014-07-26 NOTE — Plan of Care (Addendum)
Pt with "intermediate" probability result for PE on VQ. CT angio was planned for tonight. Unable to do secondary to inability to find adequate IV access for contrast. Pt with ? positive HIT on 4/1 and further testing is being done now. Also, had thrombocytopenia this admission as well. Given we are not totally sure about the PE and their is great risk associated with giving this pt Heparin, will continue to watch overnight and hopefully, in the am, adequate access can be established. Jimmye NormanKaren Kirby-Graham, NP [facility] Update: Was able to speak with Dr. Gwenlyn PerkingMadera of [facility], pt's attending. He agreed that Heparin was a risk at this time and would try to get CT in am.  [email or web address], NP

## 2014-07-26 NOTE — Progress Notes (Signed)
[facility] Progress Note  Assessment/Plan: 1. New ESRD - on MWF schedule here; started HD 3/17, using R IJ catheter due to difficulties using AVF; she is not going to do well on HD long term; will try to do next HD in a recliner to see if she can tolerate.  2. Vol excess - cont vol reduction with HD 3.3 off Mon 3.4 off Wed, -had treatment Friday but nothing recorded in terms of pre/post wt and UF amount etc. Improving overall; mostly dependent central edema- depends on positioning in bed 3. Dialysis access - AVF @ [facility] placed 01/28/14 by Dr. Darrick Penna, functioned poorly, revision pending as outpatient vs doing revision prior to d/c; using R IJ catheter placed by Dr. Hart Rochester 3/26.  4. Perforated pyloric ulcer / necrotic bowel - s/p repair of perforated ulcer & partial ileum resection 3/30, off Zosyn. Per surgery 5. Recurrent sepsis - with percutaneous drain (placed 4/8) of pelvic fluid; repeat abdominal CT 4/22 showed no fluid collection at drain site, but some ^ in collection at prior transglut perc site 6. C diff colitis - on PO Vancomycin. 7. 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 8. Hypotension /right heart failure - on midodrine 9. Anemia - Hgb 9.1, s/p 2 U PRBCs 4/13, on Aranesp 150 mcg on Mon; Fe 5% on 3/18.- repeat Fe studies next HD(Mon) 10. Sec HPT - Corr Ca 10 P 6.4 , iPTH 338 - not on Hectorol - hold on binders for now and follow P 11. PCM - Alb 1.7, diet liberalized to regular+ prostat and nepro - add vitamin- Need to be sure she does not miss meals due to care issues!! Needs a lot of encouragement- tried to get her to eat at least 25% of meals to start- significant anorexia;  12. 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 Hx H1N1 Influenza with resp failure 14. Disp - severely deconditioned; LTAC  recommended but Medicaid will not pay for this - we need to be sure she can tolerate HD in a recliner prior to d/c to SNF 15. 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 Thigh pain - she says this is new - ? HIT hx - see above; SCDs intermittently for DVT prophylaxis, defer to primary  [name], PA-C [facility] Beeper [phone] 07/26/2014,8:24 AM  LOS: 39 days   Pt seen, examined and agree w A/P as above. Not doing well, "sad", misses home and sad about her condition.   [name] MD pager [phone]    cell [phone] 07/26/2014, 3:53 PM    Subjective:   No appetite - family brought food last pm, but didn't eat much; no breakfast RN states she takes bites of meals and only sips of Nepro. C/o pain in thighs, abdomen.  Objective Filed Vitals:   07/25/14 1940 07/25/14 2046 07/26/14 0541 07/26/14 0756  BP:  127/79 113/77   Pulse:  89 86   Temp:  97.8 F (36.6 C) 97.5 F (36.4 C)   TempSrc:  Oral Oral   Resp:  16 17   Height:      Weight:      SpO2: 88% 99%  100% 91%   Physical Exam General: chronically ill, weak, lying on left side Heart: RRR Lungs: no rales Abdomen: VAC in place midline Extremities: no LE edema, some pain in palpation of thighs, no swelling Dialysis Access: left AVF + bruit and right IJ  Dialysis Orders: new start, on MWF schedule here. Lowest wt here 90kg approx  Additional Objective Labs: Basic Metabolic Panel:  Recent Labs Lab 07/20/14 1040 07/21/14 0508 07/22/14 0450 07/24/14 1419  NA 134* 138 137 135  K 4.1 3.7 3.8 4.3  CL 96 98 98 96  CO2 GLUCOSE 116* 94 138* 100*  BUN 65* 26* 34* 25*  CREATININE 6.48* 3.89* 5.40* 5.62*  CALCIUM 7.9* 7.8* 8.0* 8.2*  PHOS 7.9* 4.6  --  6.4*   Liver Function Tests:  Recent Labs Lab 07/21/14 0508 07/22/14 0450 07/24/14 1419  AST  --  25  --   ALT   --  23  --   ALKPHOS  --  136*  --   BILITOT  --  1.1  --   PROT  --  6.0  --   ALBUMIN 1.7* 1.7* 1.7*  CBC:  Recent Labs Lab 07/20/14 1040 07/21/14 0508 07/22/14 0840 07/24/14 1418  WBC 10.0 9.3 8.6 9.4  HGB 9.2* 9.3* 9.2* 9.1*  HCT 28.7* 29.2* 28.4* 29.4*  MCV 87.2 88.2 87.7 92.5  PLT 196 225 264 308  CBG:  Recent Labs Lab 07/24/14 2105 07/25/14 0819 07/25/14 1628 07/25/14 2152 07/26/14 0742  GLUCAP 83 106* 91 80 83    Studies/Results: Ct Abdomen Pelvis W Contrast  07/24/2014   CLINICAL DATA:  Abdominal abscess.  EXAM: CT ABDOMEN AND PELVIS WITH CONTRAST  TECHNIQUE: Multidetector CT imaging of the abdomen and pelvis was performed using the standard protocol following bolus administration of intravenous contrast.  CONTRAST:  OMNIPAQUE IOHEXOL 300 MG/ML  SOLN  COMPARISON:  CT scan of July 16, 2014.  FINDINGS: Severe degenerative disc disease is noted at L5-S1. No significant abnormality is noted in the visualized lung bases.  No gallstones are noted. No focal abnormality is noted in the liver, spleen or pancreas. Stable surgical drain is seen entering epigastric region with tip in left upper quadrant. No fluid is noted around this drain. Adrenal glands appear normal. Stable left renal cyst is noted. Mild right perinephric fluid is noted which is unchanged. No hydronephrosis or renal obstruction is noted. No renal or ureteral calculi are noted. Atherosclerotic calcifications of abdominal aorta and iliac arteries are noted. 3.1 cm infrarenal abdominal aortic aneurysm is noted. There is no evidence of bowel obstruction. Urinary bladder appears normal. Calcified degenerating fibroids are noted in the uterus. Ovaries appear normal.  Percutaneous trans gluteal drainage catheter noted on prior exam has been removed in the interval. Three rectal fluid collection identified on prior exam is slightly enlarged currently, measuring 6.6 x 3.4 cm. No significant adenopathy is noted.   IMPRESSION: 3.1 cm infrarenal abdominal aortic aneurysm is noted. Recommend followup by ultrasound in 3 years. This recommendation follows ACR consensus guidelines: White Paper of the ACR Incidental Findings Committee II on Vascular Findings. J Am Coll Radiol 2013; 10:789-794.  Surgical drain seen entering epigastric region with distal tip in left upper quadrant is unchanged in position. No fluid collection is noted around it.  Percutaneous transgluteal drainage catheter noted on prior exam has been removed. Pre-rectal fluid collection in pelvis seen on prior exam has slightly increased in size, currently measuring 6.6 x 3.4  cm.   Electronically Signed   By: Lupita RaiderJames  Green Jr, M.D.   On: 07/24/2014 10:41   Nm Pulmonary Perf And Vent  07/25/2014   CLINICAL DATA:  Short of breath.  Dialysis patient.  EXAM: NUCLEAR MEDICINE VENTILATION - PERFUSION LUNG SCAN  TECHNIQUE: Ventilation images were obtained in multiple projections using inhaled aerosol technetium 99 M DTPA. Perfusion images were obtained in multiple projections after intravenous injection of Tc-27m MAA.  RADIOPHARMACEUTICALS:  40 mCi Tc-827m DTPA aerosol and 6 mCi Tc-787m MAA  COMPARISON:  CXR 07/14/2014  FINDINGS: Ventilation: 2 segment perfusion defect left lower lobe is matched with the ventilation study. Patchy ventilation on the right with a subsegmental defect in the right lung base  Perfusion: Normal perfusion of the right lung. Two segment matched perfusion defect left lower lobe.  IMPRESSION: Intermediate probability for pulmonary embolism.   Electronically Signed   By: Marlan Palauharles  Clark M.D.   On: 07/25/2014 14:20   Medications:   . amiodarone  200 mg Oral BID  . budesonide (PULMICORT) nebulizer solution  0.25 mg Nebulization BID  . darbepoetin (ARANESP) injection - DIALYSIS  150 mcg Intravenous Q Mon-HD  . feeding supplement (NEPRO CARB STEADY)  237 mL Oral BID BM  . feeding supplement (PRO-STAT SUGAR FREE 64)  30 mL Oral TID BM  . feeding  supplement (RESOURCE BREEZE)  1 Container Oral Q1500  . insulin aspart  0-9 Units Subcutaneous TID WC  . ipratropium  0.5 mg Nebulization BID  . levalbuterol  0.63 mg Nebulization BID  . levothyroxine  25 mcg Oral QAC breakfast  . metoprolol tartrate  25 mg Oral BID  . midodrine  10 mg Oral BID WC  . multivitamin  1 tablet Oral QHS  . sodium chloride  10-40 mL Intracatheter Q12H  . vancomycin  125 mg Oral 4 times per day

## 2014-07-26 NOTE — Progress Notes (Addendum)
Progress Note [facility].   [name] [id number] DOB: Apr 14, 1955 DOA: 06/17/2014 PCP: Willey BladeEAN, [name], MD  Admit HPI / Brief Narrative21: 59 yo female smoker with hx HTN, ESRD on HD (just began this admit), COPD, failed L AV fistula, initially admitted 3/16 with flu and acute hypoxic resp failure. Ultimately tx to [facility] due to need for HD. 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   Patient on 3/31 was taking to the OR for ex lap for per pyloric ulcer and sm bowel resection. Patient develops septic shock on 4-1, she was started on pressors and wean off pressors on 4-3. Subsequently she was diagnosed with pelvic abscess by CT scan perform on 4-7. She underwent pelvic drain placement by IR. Patient also develops C. diff diagnosed 4-12.  C diff was not responding flagyl, and she was started on vancomycin 4-16. She also develops  run of SVT. Cardiology has been helping with management.    HPI/Subjective: Diarrhea continued improving. Patient denies any chest pain. Increase in her oxygen demand and decreased oxygen sat; with high concerns of potential pulmonary embolism. Appetite continue to be poor  Assessment/Plan: C. difficile colitis C diff positive on 4-12.  Received 4 days of flagyl without improvement.  Will count as day 3/14 for oral vanc  Acute hypoxic respiratory failure secondary to H1 N1 influenza +/- HCAP Stable/resolved Has completed antibiotics and resp is stable Still on zosyn for abd abscess  Some wheezing on exam and patient with mild SOB -will use pulmicort and continue using flutter valve/pulmonary hygiene and xopenex  PSVT/concerns for pulmonary embolism given extremely high heart right side pressure Has been evaluated by EP - TSH is slightly elevated; will check Free T4 and T3 Cardiology following - follow K+ and Mg  Continue with metoprolol and amiodarone; cardiology adjusting medications.  2-D echo to be repeated, with concerns for  pericardial rub Echo demonstrated extremely elevated heart right side pressure and moderately reduce function of RV VQ scan with intermediate probability for pulmonary embolism; given patient history of mild positive hit test and thrombocytopenia would like to be sure if she had PE before starting anticoagulation -will check CT angio chest; discussed with renal and in agreement   Small PFO Has been evaluated by Cardiology No need for closure currently  ACA CVA with Lt sided weakness Will need ongoing long term rehab - PT/OT to cont to follow while inpatient  ESRD new start HD this admit  Ongoing hemodialysis per Nephrology - perm-cath placed 3/26 d/t AVF problems Electrolytes abnormalities corrected with dialysis.  Patient to have AV fistula reviewed at some point for permanent access (most likely in outpatient setting) .  Status post exploratory laparotomy with closure of perforated pyloric ulcer/abdominal abscess / small bowel resection w/ postop ileus General Surgery continues to follow; CT today to evaluate on abscess  now off TNA and with calorie counting/regular diet Appetite remains poor Complaining of nausea, but not vomiting  Pelvic abscess status post percutaneous drain placement 4/8 Ongoing care per general surgery and IR Day 23 of zosyn.  Received  18 days of vancomycin and  18 days  of fluconazole.  Body fluid grew lactobacillus species.  Plan to repeat ct scan today 4/22 (see below for results) Will follow recommendations from [facility]  Chronic diastolic congestive heart failure w/ Severe RHF EF 60-65%, grade 1 diastolic dysfunction. Appears compensated.  Fluids/volume manage with hemodialysis.   Anemia of critical illness and chronic disease  Hgb presently stable s/p 2U PRBC 4/13 - follow  HB remains stable.  Aranesp/iron per renal discretion   Thrombocytopenia Appears to be associated with sepsis plt count now within normal limits follow trend No signs of overt  bleeding Has had mild positive HIT test during this admission   Asthma/COPD Patient with mild wheezing on exam; but fair air movement Continue oxygen supplementation as needed Will continue pulmicort Continue as needed xopenex  Diabetes mellitus 2 CBG reasonably controlled - cont to follow  No further Hypoglycemic event Patient eating poorly; appetite is still not good  Abnormal TSH -Free T4 WNL -T3 1.8 -most likely sick thyroid with ongoing infection and body stress; also due to amiodarone use. TSH in 7 range -will continue low dose synthroid given PSVT  Obesity -  Body mass index is 32.49 kg/(m^2).  Code Status: FULL Family Communication: no family present at time of exam Disposition Plan:  continue PT/OT while inpatient as availability allows. Will need long-term rehabilitation at SNF; once able to tolerate HD on recliner and with improve nutrition.    Consultants: Cardiology Nephrology PCCM Gen Surgery   Significant Events: 3/16 influenza A+ 3/18 R ACA stroke 3/19 MRI multifocal acute infarction of both hemispheres, large right distal ACA 3/22 TEE severe RAE, severely reduced RV function, severe TR, no LAA thrombus, small PFO on TEE 3/31 to OR ex lap for per pyloric ulcer and sm bowel resection 4/1 septic shock on pressors 4/3 pressors weaned off  4/4 restart pressors, CRRT 4/6 levo switched to neo 4/7 pelvic abscess on CT 4/8 IR placed pelvic drain 4/9 off pressors 4/11 long run of SVT. 4/23 VQ scan which demonstrated intermediate probability for PE 4/24 CT angiogram of her chest: Pending  Antibiotics: Zosyn >stopped on 4/22 (received a total of 23 days of treatment ) Oral Flagyl 4/13 >  DVT prophylaxis: SCDs  Objective: Blood pressure 113/77, pulse 86, temperature 97.5 F (36.4 C), temperature source Oral, resp. rate 17, height  (1.676 m), weight 91.264 kg (201 lb 3.2 oz), SpO2 89 %.  Intake/Output Summary (Last 24 hours) at 07/26/14 1554 Last  data filed at 07/26/14 0748  Gross per 24 hour  Intake    360 ml  Output    430 ml  Net    -70 ml   Exam: General: No fever; patient continued complaining of mild abdominal discomfort and has had increase in oxygen requirement due to shortness of breath and decrease in her O2 sat. Denies CP at this moment.. Lungs: scattered rhonchi, mild expiratory wheezes and fair air movement, Cardiovascular: Regular rate, no murmurs and no gallops   Abdomen: Abdominal wound dressing, clean and dry, abdomen nondistended, drain intact and with serous drainage; patient reports mild pain with deep palpation; no guarding Extremities: No significant cyanosis, clubbing, edema bilateral lower extremities  Data Reviewed: Basic Metabolic Panel:  Recent Labs Lab 07/20/14 1040 07/21/14 0508 07/22/14 0450 07/24/14 1419  NA 134* 138 137 135  K 4.1 3.7 3.8 4.3  CL 96 98 98 96  CO2 GLUCOSE 116* 94 138* 100*  BUN 65* 26* 34* 25*  CREATININE 6.48* 3.89* 5.40* 5.62*  CALCIUM 7.9* 7.8* 8.0* 8.2*  MG  --   --  1.9  --   PHOS 7.9* 4.6  --  6.4*    Liver Function Tests:  Recent Labs Lab 07/20/14 1040 07/21/14 0508 07/22/14 0450 07/24/14 1419  AST  --   --  25  --  ALT  --   --  23  --   ALKPHOS  --   --  136*  --   BILITOT  --   --  1.1  --   PROT  --   --  6.0  --   ALBUMIN 1.6* 1.7* 1.7* 1.7*   CBC:  Recent Labs Lab 07/20/14 1040 07/21/14 0508 07/22/14 0840 07/24/14 1418  WBC 10.0 9.3 8.6 9.4  HGB 9.2* 9.3* 9.2* 9.1*  HCT 28.7* 29.2* 28.4* 29.4*  MCV 87.2 88.2 87.7 92.5  PLT 196 225 264 308    CBG:  Recent Labs Lab 07/25/14 0819 07/25/14 1628 07/25/14 2152 07/26/14 0742 07/26/14 1232  GLUCAP 106* 91 80 83 95    Scheduled Meds:  Scheduled Meds: . amiodarone  200 mg Oral BID  . budesonide (PULMICORT) nebulizer solution  0.25 mg Nebulization BID  . darbepoetin (ARANESP) injection - DIALYSIS  150 mcg Intravenous Q Mon-HD  . feeding supplement (NEPRO CARB  STEADY)  237 mL Oral BID BM  . feeding supplement (PRO-STAT SUGAR FREE 64)  30 mL Oral TID BM  . feeding supplement (RESOURCE BREEZE)  1 Container Oral Q1500  . insulin aspart  0-9 Units Subcutaneous TID WC  . ipratropium  0.5 mg Nebulization BID  . levalbuterol  0.63 mg Nebulization BID  . levothyroxine  25 mcg Oral QAC breakfast  . metoprolol tartrate  25 mg Oral BID  . midodrine  10 mg Oral BID WC  . multivitamin  1 tablet Oral QHS  . sodium chloride  10-40 mL Intracatheter Q12H  . vancomycin  125 mg Oral 4 times per day    Time spent on care of this patient: 55 mins (more than 50% of the time was dedicated to face-to-face examination, coordination of care and discussion with other especially his involved to make best decision for this patient)   Vassie Loll , MD  [phone] [facility] Office  [phone] Pager - Text Page per Amion as per below:  On-Call/Text Page:      [email or web address]      password [email or web address]  If 7PM-7AM, please contact night-coverage [email or web address] Password [facility] 07/26/2014, 3:54 PM   LOS: 39 days

## 2014-07-26 NOTE — Progress Notes (Signed)
[facility] Progress Note  25 Days Post-Op  Subjective: Pt says she's sad and depressed.  She says she wants to go home.  Says her abdomen over her wound vac hurts.  No N/V, but appetite is poor.    Objective: Vital signs in last 24 hours: Temp:  [97.5 F (36.4 C)-98.9 F (37.2 C)] 97.5 F (36.4 C) (04/24 0541) Pulse Rate:  [86-89] 86 (04/24 0541) Resp:  [16-17] 17 (04/24 0541) BP: (99-127)/(74-79) 113/77 mmHg (04/24 0541) SpO2:  [88 %-100 %] 89 % (04/24 0916) Last BM Date: 07/25/14  Intake/Output from previous day: 04/23 0701 - 04/24 0700 In: 420 [P.O.:420] Out: 605 [Drains:605] Intake/Output this shift: Total I/O In: -  Out: 125 [Drains:125]  PE: Gen:  Alert, NAD, pleasant Abd: Soft, mildly tender over wound vac site, ND, +BS, no HSM, wound vac in place, drain with grayish/green serous output   Lab Results:   Recent Labs  07/24/14 1418  WBC 9.4  HGB 9.1*  HCT 29.4*  PLT 308   BMET  Recent Labs  07/24/14 1419  NA 135  K 4.3  CL 96  CO2 25  GLUCOSE 100*  BUN 25*  CREATININE 5.62*  CALCIUM 8.2*   PT/INR No results for input(s): LABPROT, INR in the last 72 hours. CMP     Component Value Date/Time   NA 135 07/24/2014 1419   NA 139 10/02/2012 1046   K 4.3 07/24/2014 1419   K 4.4 10/02/2012 1046   CL 96 07/24/2014 1419   CO2 25 07/24/2014 1419   CO2 25 10/02/2012 1046   GLUCOSE 100* 07/24/2014 1419   GLUCOSE 110 10/02/2012 1046   BUN 25* 07/24/2014 1419   BUN 35.2* 10/02/2012 1046   CREATININE 5.62* 07/24/2014 1419   CREATININE 2.5* 10/02/2012 1046   CALCIUM 8.2* 07/24/2014 1419   CALCIUM 9.8 10/02/2012 1046   PROT 6.0 07/22/2014 0450   PROT 8.4* 10/02/2012 1046   ALBUMIN 1.7* 07/24/2014 1419   ALBUMIN 3.1* 10/02/2012 1046   AST 25 07/22/2014 0450   AST 9 10/02/2012 1046   ALT 23 07/22/2014 0450   ALT <6 Repeated and Verified 10/02/2012 1046   ALKPHOS 136* 07/22/2014 0450   ALKPHOS 108 10/02/2012 1046   BILITOT 1.1  07/22/2014 0450   BILITOT 0.21 10/02/2012 1046   GFRNONAA 8* 07/24/2014 1419   GFRAA 9* 07/24/2014 1419   Lipase  No results found for: LIPASE     Studies/Results: Ct Abdomen Pelvis W Contrast  07/24/2014   CLINICAL DATA:  Abdominal abscess.  EXAM: CT ABDOMEN AND PELVIS WITH CONTRAST  TECHNIQUE: Multidetector CT imaging of the abdomen and pelvis was performed using the standard protocol following bolus administration of intravenous contrast.  CONTRAST:  OMNIPAQUE IOHEXOL 300 MG/ML  SOLN  COMPARISON:  CT scan of July 16, 2014.  FINDINGS: Severe degenerative disc disease is noted at L5-S1. No significant abnormality is noted in the visualized lung bases.  No gallstones are noted. No focal abnormality is noted in the liver, spleen or pancreas. Stable surgical drain is seen entering epigastric region with tip in left upper quadrant. No fluid is noted around this drain. Adrenal glands appear normal. Stable left renal cyst is noted. Mild right perinephric fluid is noted which is unchanged. No hydronephrosis or renal obstruction is noted. No renal or ureteral calculi are noted. Atherosclerotic calcifications of abdominal aorta and iliac arteries are noted. 3.1 cm infrarenal abdominal aortic aneurysm is noted. There is no evidence of bowel  obstruction. Urinary bladder appears normal. Calcified degenerating fibroids are noted in the uterus. Ovaries appear normal.  Percutaneous trans gluteal drainage catheter noted on prior exam has been removed in the interval. Three rectal fluid collection identified on prior exam is slightly enlarged currently, measuring 6.6 x 3.4 cm. No significant adenopathy is noted.  IMPRESSION: 3.1 cm infrarenal abdominal aortic aneurysm is noted. Recommend followup by ultrasound in 3 years. This recommendation follows ACR consensus guidelines: White Paper of the ACR Incidental Findings Committee II on Vascular Findings. J Am Coll Radiol 2013; 10:789-794.  Surgical drain seen  entering epigastric region with distal tip in left upper quadrant is unchanged in position. No fluid collection is noted around it.  Percutaneous transgluteal drainage catheter noted on prior exam has been removed. Pre-rectal fluid collection in pelvis seen on prior exam has slightly increased in size, currently measuring 6.6 x 3.4 cm.   Electronically Signed   By: Lupita Raider, M.D.   On: 07/24/2014 10:41   Nm Pulmonary Perf And Vent  07/25/2014   CLINICAL DATA:  Short of breath.  Dialysis patient.  EXAM: NUCLEAR MEDICINE VENTILATION - PERFUSION LUNG SCAN  TECHNIQUE: Ventilation images were obtained in multiple projections using inhaled aerosol technetium 99 M DTPA. Perfusion images were obtained in multiple projections after intravenous injection of Tc-60m MAA.  RADIOPHARMACEUTICALS:  40 mCi Tc-34m DTPA aerosol and 6 mCi Tc-7m MAA  COMPARISON:  CXR 07/14/2014  FINDINGS: Ventilation: 2 segment perfusion defect left lower lobe is matched with the ventilation study. Patchy ventilation on the right with a subsegmental defect in the right lung base  Perfusion: Normal perfusion of the right lung. Two segment matched perfusion defect left lower lobe.  IMPRESSION: Intermediate probability for pulmonary embolism.   Electronically Signed   By: Marlan Palau M.D.   On: 07/25/2014 14:20    Anti-infectives: Anti-infectives    Start     Dose/Rate Route Frequency Ordered Stop   07/18/14 1800  vancomycin (VANCOCIN) 50 mg/mL oral solution 125 mg     125 mg Oral 4 times per day 07/18/14 1455     07/17/14 1830  vancomycin (VANCOCIN) IVPB 750 mg/150 ml premix     750 mg 150 mL/hr over 60 Minutes Intravenous  Once 07/17/14 1818 07/18/14 0024   07/15/14 1400  metroNIDAZOLE (FLAGYL) tablet 500 mg  Status:  Discontinued     500 mg Oral 3 times per day 07/15/14 1202 07/18/14 1533   07/13/14 2200  piperacillin-tazobactam (ZOSYN) IVPB 2.25 g  Status:  Discontinued     2.25 g 100 mL/hr over 30 Minutes Intravenous 3  times per day 07/13/14 1318 07/24/14 1138   07/13/14 2000  fluconazole (DIFLUCAN) IVPB 200 mg  Status:  Discontinued     200 mg 100 mL/hr over 60 Minutes Intravenous Every 24 hours 07/13/14 1318 07/19/14 1027   07/07/14 1200  vancomycin (VANCOCIN) IVPB 1000 mg/200 mL premix  Status:  Discontinued     1,000 mg 200 mL/hr over 60 Minutes Intravenous Every 24 hours 07/07/14 0937 07/14/14 1712   07/06/14 2000  fluconazole (DIFLUCAN) IVPB 400 mg  Status:  Discontinued     400 mg 100 mL/hr over 120 Minutes Intravenous Every 24 hours 07/06/14 1507 07/13/14 1318   07/06/14 1800  piperacillin-tazobactam (ZOSYN) IVPB 2.25 g  Status:  Discontinued     2.25 g 100 mL/hr over 30 Minutes Intravenous 4 times per day 07/06/14 1505 07/13/14 1318   07/05/14 2000  fluconazole (DIFLUCAN) IVPB 200 mg  Status:  Discontinued     200 mg 100 mL/hr over 60 Minutes Intravenous Every 24 hours 07/05/14 0757 07/06/14 1507   07/05/14 1400  piperacillin-tazobactam (ZOSYN) IVPB 2.25 g  Status:  Discontinued     2.25 g 100 mL/hr over 30 Minutes Intravenous 3 times per day 07/05/14 0749 07/06/14 1505   07/02/14 1800  vancomycin (VANCOCIN) IVPB 1000 mg/200 mL premix  Status:  Discontinued     1,000 mg 200 mL/hr over 60 Minutes Intravenous Every 24 hours 07/01/14 1858 07/05/14 0801   07/01/14 2200  piperacillin-tazobactam (ZOSYN) IVPB 3.375 g  Status:  Discontinued     3.375 g 100 mL/hr over 30 Minutes Intravenous 4 times per day 07/01/14 1858 07/05/14 0749   07/01/14 2000  fluconazole (DIFLUCAN) IVPB 400 mg  Status:  Discontinued     400 mg 100 mL/hr over 120 Minutes Intravenous Every 24 hours 07/01/14 1858 07/05/14 0757   07/01/14 1400  fluconazole (DIFLUCAN) IVPB 200 mg  Status:  Discontinued     200 mg 100 mL/hr over 60 Minutes Intravenous Every 24 hours 07/01/14 1330 07/01/14 1853   07/01/14 1000  piperacillin-tazobactam (ZOSYN) IVPB 2.25 g  Status:  Discontinued     2.25 g 100 mL/hr over 30 Minutes Intravenous Every  8 hours 07/01/14 0952 07/01/14 1853   07/01/14 1000  vancomycin (VANCOCIN) 500 mg in sodium chloride 0.9 % 100 mL IVPB     500 mg 100 mL/hr over 60 Minutes Intravenous  Once 07/01/14 0952 07/01/14 1126   06/30/14 1200  vancomycin (VANCOCIN) IVPB 1000 mg/200 mL premix     1,000 mg 200 mL/hr over 60 Minutes Intravenous  Once 06/30/14 0944 06/30/14 1403   06/30/14 1200  ceFEPIme (MAXIPIME) 2 g in dextrose 5 % 50 mL IVPB     2 g 100 mL/hr over 30 Minutes Intravenous  Once 06/30/14 0944 06/30/14 1425   06/30/14 0100  vancomycin (VANCOCIN) 2,000 mg in sodium chloride 0.9 % 500 mL IVPB     2,000 mg 250 mL/hr over 120 Minutes Intravenous  Once 06/30/14 0048 06/30/14 0525   06/30/14 0100  ceFEPIme (MAXIPIME) 2 g in dextrose 5 % 50 mL IVPB     2 g 100 mL/hr over 30 Minutes Intravenous  Once 06/30/14 0048 06/30/14 0322   06/27/14 0600  cefUROXime (ZINACEF) 1.5 g in dextrose 5 % 50 mL IVPB     1.5 g 100 mL/hr over 30 Minutes Intravenous On call to O.R. 06/26/14 1019 06/27/14 0630   06/19/14 1800  oseltamivir (TAMIFLU) capsule 30 mg     30 mg Oral Every M-W-F (1800) 06/19/14 1003 06/22/14 1841   06/18/14 1600  oseltamivir (TAMIFLU) capsule 30 mg  Status:  Discontinued     30 mg Oral Daily 06/18/14 1538 06/19/14 1003       Assessment/Plan POD #24 s/p Exploratory Laparotomy with graham patch closure of perforated pyloric ulcer, SBR - 07/01/2014 - In[facility] Intra-abdominal abscess -IR drain was pulled out accidentally. Surgical drain--increased output (6905mL/24hr), grayish/green serous drainage.  Drain is high output, Dr. Derrell LollingIngram recommends continuing it and doing a drain study to check for fistula.  Most recent CT was not able to fully rule out a gastric leak. -CT 07/24/14 showed 6.6x3.4cm pre-rectal fluid collection.  Antibiotics from abdominal perspective have been stopped -VAC back on MWF, check wound on Monday -I&Os and routine drain care -JP drainage is grayish green, Fluro guided drain study  today to see if there is a fistula VTE prophylaxis-SCDs  ID--Zosyn stopped on day #23 (07/24/14) C diff + -- Oral vanc New ESRD PCM- tolerating POs, but appetite is poor Dispo-continue inpatient, d/c to SNF when she can tolerate HD in a recliner per nephrology and she can obtain enough nutrition.    LOS: 39 days    DORT, Aundra Millet 07/26/2014, 9:49 AM Pager: [phone]

## 2014-07-26 NOTE — Progress Notes (Signed)
Patient scheduled for CT angiogram of chest this evening.  IV team unable to get 20 gauge IV needed for CT.  Patient has poor peripheral veins.  Patient has central line and HD catheter; however, CT stated they were unable to use either line.  Notified Donnamarie PoagK. Kirby NP of situation.  NP called back and stated further decisions regarding CT will be made in the morning.  VVS.  No chest pain.  Will continue to monitor.

## 2014-07-27 ENCOUNTER — Inpatient Hospital Stay (HOSPITAL_COMMUNITY): Payer: Medicaid Other

## 2014-07-27 DIAGNOSIS — D7582 Heparin induced thrombocytopenia (HIT): Secondary | ICD-10-CM | POA: Diagnosis not present

## 2014-07-27 DIAGNOSIS — D75829 Heparin-induced thrombocytopenia, unspecified: Secondary | ICD-10-CM | POA: Diagnosis not present

## 2014-07-27 DIAGNOSIS — N179 Acute kidney failure, unspecified: Secondary | ICD-10-CM

## 2014-07-27 DIAGNOSIS — N189 Chronic kidney disease, unspecified: Secondary | ICD-10-CM

## 2014-07-27 DIAGNOSIS — L988 Other specified disorders of the skin and subcutaneous tissue: Secondary | ICD-10-CM | POA: Insufficient documentation

## 2014-07-27 LAB — BASIC METABOLIC PANEL
Anion gap: 17 — ABNORMAL HIGH (ref 5–15)
BUN: 29 mg/dL — ABNORMAL HIGH (ref 6–23)
CHLORIDE: 93 mmol/L — AB (ref 96–112)
CO2: 27 mmol/L (ref 19–32)
Calcium: 8.5 mg/dL (ref 8.4–10.5)
Creatinine, Ser: 7.02 mg/dL — ABNORMAL HIGH (ref 0.50–1.10)
GFR calc Af Amer: 7 mL/min — ABNORMAL LOW (ref >90)
GFR, EST NON AFRICAN AMERICAN: 6 mL/min — AB (ref >90)
GLUCOSE: 93 mg/dL (ref 70–99)
Potassium: 4.4 mmol/L (ref 3.5–5.1)
Sodium: 137 mmol/L (ref 135–145)

## 2014-07-27 LAB — CBC
HEMATOCRIT: 31 % — AB (ref 36.0–46.0)
HEMOGLOBIN: 9.3 g/dL — AB (ref 12.0–15.0)
MCH: 28 pg (ref 26.0–34.0)
MCHC: 30 g/dL (ref 30.0–36.0)
MCV: 93.4 fL (ref 78.0–100.0)
PLATELETS: 288 10*3/uL (ref 150–400)
RBC: 3.32 MIL/uL — ABNORMAL LOW (ref 3.87–5.11)
RDW: 23.2 % — ABNORMAL HIGH (ref 11.5–15.5)
WBC: 11.4 10*3/uL — AB (ref 4.0–10.5)

## 2014-07-27 LAB — GLUCOSE, CAPILLARY
GLUCOSE-CAPILLARY: 70 mg/dL (ref 70–99)
GLUCOSE-CAPILLARY: 82 mg/dL (ref 70–99)

## 2014-07-27 MED ORDER — DARBEPOETIN ALFA 150 MCG/0.3ML IJ SOSY
PREFILLED_SYRINGE | INTRAMUSCULAR | Status: AC
  Filled 2014-07-27: qty 0.3

## 2014-07-27 MED ORDER — PENTAFLUOROPROP-TETRAFLUOROETH EX AERO: 1.0000 "application " | INHALATION_SPRAY | CUTANEOUS | Status: DC | PRN

## 2014-07-27 MED ORDER — LIDOCAINE-PRILOCAINE 2.5-2.5 % EX CREA: 1.0000 "application " | TOPICAL_CREAM | CUTANEOUS | Status: DC | PRN

## 2014-07-27 MED ORDER — LIDOCAINE HCL (PF) 1 % IJ SOLN: 5.0000 mL | INTRAMUSCULAR | Status: DC | PRN

## 2014-07-27 MED ORDER — HYDROCODONE-ACETAMINOPHEN 5-325 MG PO TABS
ORAL_TABLET | ORAL | Status: AC
  Filled 2014-07-27: qty 2

## 2014-07-27 MED ORDER — SODIUM CHLORIDE 0.9 % IV SOLN: 100.0000 mL | INTRAVENOUS | Status: DC | PRN

## 2014-07-27 MED ORDER — ALTEPLASE 2 MG IJ SOLR
2.0000 mg | Freq: Once | INTRAMUSCULAR | Status: AC | PRN
  Filled 2014-07-27: qty 2

## 2014-07-27 MED ORDER — IOHEXOL 300 MG/ML  SOLN
50.0000 mL | Freq: Once | INTRAMUSCULAR | Status: AC | PRN
  Administered 2014-07-27: 30 mL

## 2014-07-27 MED ORDER — MIDODRINE HCL 5 MG PO TABS
ORAL_TABLET | ORAL | Status: AC
  Filled 2014-07-27: qty 2

## 2014-07-27 MED ORDER — NEPRO/CARBSTEADY PO LIQD: 237.0000 mL | ORAL | Status: DC | PRN

## 2014-07-27 MED ORDER — SODIUM CHLORIDE 0.9 % IV SOLN
100.0000 mL | INTRAVENOUS | Status: DC | PRN
Start: 2014-07-27 — End: 2014-07-29

## 2014-07-27 NOTE — Progress Notes (Signed)
Physical Therapy Treatment Patient Details Name: [name] MRN: [id number] DOB: 13-Mar-1956 Today's Date: 07/27/2014    History of Present Illness [name] is a 59 y.o. female with a history of CKD and hypertension came to the [facility] ED on 3/16 complaining of shortness of breath and generalized weakness x 2 weeks. Admitted with acute hypoxemic respiratory failure. During hospital stay noted to have weakness of her left side. 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  She developed septic shock 4/1 with pressors weaned off 4/3.  Began CRRT and pressors restarted 4/4; Developed pelvic abcess 4/7 with pelvic drain placed 4/8.  weaned off pressors 4/9.  Runs of SVT 4/5 and 4/11    PT Comments    Depressed in session today, but after talking about her immediate need for washing up, and some of the benefits of getting up, she was agreeable to getting up and moving, saying "I'll try"   Noted improvement in bed mobility, rolling to L  in particular; Still needing +2 assist to help transition to the chair, and she seemed more comfortable once up;  Noted for Palliative Care team consult tomorrow; Will follow your lead: If PT (and OT) fit in her goals of care, will be happy to continue working with pt;  If not, will sign off;   It is of note that as PT/rehab tech ended session and left room, pt requested we "don't forget about me (her)"   Follow Up Recommendations  LTACH;Supervision/Assistance - 24 hour     Equipment Recommendations  Other (comment) (TBA)    Recommendations for Other Services       Precautions / Restrictions Precautions Precautions: Fall Precaution Comments: JP drain, VAC dressing to abdomen    Mobility  Bed Mobility Overal bed mobility: Needs Assistance;+2 for physical assistance Bed Mobility: Supine to Sit Rolling: Min guard (to left  side good use of rail) Sidelying to sit: +2 for physical assistance;Max assist       General bed mobility comments: Noted very good use of LUE to reach to rail for rolling; Max assist to roll to R; +2 assist to elevate trunk sidelying to sit with noted RUE push; very anxious and benefitted from support in front and back  Transfers Overall transfer level: Needs assistance Equipment used: 2 person hand held assist Transfers: Stand Pivot Transfers   Stand pivot transfers: Max assist;+2 physical assistance       General transfer comment: Pt requires bil LE blocked and anterior tilt to complete sit<>Stand; Max assist for weight shifting to make transition to chair; L knee blocked for safety; decr control with stand to sit  Ambulation/Gait                 Stairs            Wheelchair Mobility    Modified Rankin (Stroke Patients Only) Modified Rankin (Stroke Patients Only) Pre-Morbid Rankin Score: Slight disability Modified Rankin: Severe disability     Balance     Sitting balance-Leahy Scale: Poor (approaching Fair)                              Cognition Arousal/Alertness: Awake/alert Behavior During Therapy: Anxious (but willing to get OOB) Overall Cognitive Status: Within Functional Limits for tasks assessed  General Comments: Following all commands, and willing to participate    Exercises      General Comments        Pertinent Vitals/Pain Pain Assessment: Faces Faces Pain Scale: Hurts even more Pain Location: LLE Pain Descriptors / Indicators: Aching;Grimacing Pain Intervention(s): Limited activity within patient's tolerance;Monitored during session;Repositioned    Home Living                      Prior Function            PT Goals (current goals can now be found in the care plan section) Acute Rehab PT Goals Patient Stated Goal: to get better  PT Goal Formulation: With patient Time For Goal  Achievement: 07/29/14 Potential to Achieve Goals: Good Progress towards PT goals: Progressing toward goals (Slowly)    Frequency  Min 3X/week    PT Plan Current plan remains appropriate    Co-evaluation             End of Session Equipment Utilized During Treatment: Gait belt (and bed pad) Activity Tolerance: Patient tolerated treatment well Patient left: in chair;with call bell/phone within reach;with chair alarm set     Time: [id number] PT Time Calculation (min) (ACUTE ONLY): 34 min  Charges:  $Therapeutic Activity: 23-37 mins                    G Codes:      Olen PelGarrigan, Jakaleb Payer Hamff 07/27/2014, 3:15 PM  Van ClinesHolly Teneisha Gignac, [place]PT  Acute Rehabilitation Services Pager [phone] Office [phone]
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
## 2014-07-27 NOTE — Progress Notes (Signed)
Patient on air mattress overlay, which was programmed for alternating turns.  Patient requested for turning option on air mattress to be turned off because it was causing her discomfort.  Educated patient regarding rationale for turning.  Still wished for it to be turned off.  Air mattress placed on static mode.  Will continue to monitor.

## 2014-07-27 NOTE — Progress Notes (Signed)
[facility] Progress Note  26 Days Post-Op  Subjective: Pt sad and depressed, but says her pain is better than yesterday.  No N/V but no appetite.  Tolerating some liquids/food.  Says her incision site is sore.    Objective: Vital signs in last 24 hours: Temp:  [97.6 F (36.4 C)-98.1 F (36.7 C)] 97.8 F (36.6 C) (04/25 1000) Pulse Rate:  [84-85] 85 (04/25 1000) Resp:  [14-17] 16 (04/25 1000) BP: (96-115)/(63-83) 98/77 mmHg (04/25 1000) SpO2:  [95 %-100 %] 97 % (04/25 1000) Weight:  [92.67 kg (204 lb 4.8 oz)] 92.67 kg (204 lb 4.8 oz) (04/25 0605) Last BM Date: 07/25/14  Intake/Output from previous day: 04/24 0701 - 04/25 0700 In: 660 [P.O.:660] Out: 365 [Drains:365] Intake/Output this shift:    PE: Gen:  Alert, NAD, pleasant, but appears stressed/sad/tearful Abd: Soft, mildly tender over wound vac site, ND, +BS, no HSM, wound vac in place with serosanguinous drainage (64mL/24hr), Blake drain with green serous output 361mL/24hr.   Lab Results:   Recent Labs  07/24/14 1418  WBC 9.4  HGB 9.1*  HCT 29.4*  PLT 308   BMET  Recent Labs  07/24/14 1419  NA 135  K 4.3  CL 96  CO2 25  GLUCOSE 100*  BUN 25*  CREATININE 5.62*  CALCIUM 8.2*   PT/INR No results for input(s): LABPROT, INR in the last 72 hours. CMP     Component Value Date/Time   NA 135 07/24/2014 1419   NA 139 10/02/2012 1046   K 4.3 07/24/2014 1419   K 4.4 10/02/2012 1046   CL 96 07/24/2014 1419   CO2 25 07/24/2014 1419   CO2 25 10/02/2012 1046   GLUCOSE 100* 07/24/2014 1419   GLUCOSE 110 10/02/2012 1046   BUN 25* 07/24/2014 1419   BUN 35.2* 10/02/2012 1046   CREATININE 5.62* 07/24/2014 1419   CREATININE 2.5* 10/02/2012 1046   CALCIUM 8.2* 07/24/2014 1419   CALCIUM 9.8 10/02/2012 1046   PROT 6.0 07/22/2014 0450   PROT 8.4* 10/02/2012 1046   ALBUMIN 1.7* 07/24/2014 1419   ALBUMIN 3.1* 10/02/2012 1046   AST 25 07/22/2014 0450   AST 9 10/02/2012 1046   ALT 23 07/22/2014  0450   ALT <6 Repeated and Verified 10/02/2012 1046   ALKPHOS 136* 07/22/2014 0450   ALKPHOS 108 10/02/2012 1046   BILITOT 1.1 07/22/2014 0450   BILITOT 0.21 10/02/2012 1046   GFRNONAA 8* 07/24/2014 1419   GFRAA 9* 07/24/2014 1419   Lipase  No results found for: LIPASE     Studies/Results: Nm Pulmonary Perf And Vent  07/25/2014   CLINICAL DATA:  Short of breath.  Dialysis patient.  EXAM: NUCLEAR MEDICINE VENTILATION - PERFUSION LUNG SCAN  TECHNIQUE: Ventilation images were obtained in multiple projections using inhaled aerosol technetium 99 M DTPA. Perfusion images were obtained in multiple projections after intravenous injection of Tc-38m MAA.  RADIOPHARMACEUTICALS:  40 mCi Tc-67m DTPA aerosol and 6 mCi Tc-11m MAA  COMPARISON:  CXR 07/14/2014  FINDINGS: Ventilation: 2 segment perfusion defect left lower lobe is matched with the ventilation study. Patchy ventilation on the right with a subsegmental defect in the right lung base  Perfusion: Normal perfusion of the right lung. Two segment matched perfusion defect left lower lobe.  IMPRESSION: Intermediate probability for pulmonary embolism.   Electronically Signed   By: Marlan Palau M.D.   On: 07/25/2014 14:20   Dg Sinus/fist Tube Chk-non Gi  07/27/2014   CLINICAL DATA:  Perforated duodenal  ulcer with surgical repair. Surgical drain in the upper abdomen. Continued prominent drainage.  EXAM: ABSCESS INJECTION  TECHNIQUE: I injected 30 cc of Omnipaque 300 into the drainage catheter under direct fluoroscopic visualization. [name] tolerated the procedure well.  CONTRAST:  30mL OMNIPAQUE IOHEXOL 300 MG/ML  SOLN  FLUOROSCOPY TIME:  Fluoroscopy Time (in minutes and seconds): 0 min 36 seconds  COMPARISON:  CT scan dated 07/24/2014  FINDINGS: There is a 2 cm cavity which appears to lie at the superior aspect of the apex of the duodenal bulb, directly communicating with the drainage catheter. This cavity does communicate with the duodenal bulb with  contrast entering the stomach and duodenum. There is no free intraperitoneal extravasation of contrast.  IMPRESSION: Contained cavity adjacent to the apex of the duodenal bulb which communicates with the drainage catheter. No free intraperitoneal leakage.  Critical Value/emergent results were called by telephone at the time of interpretation on 07/27/2014 at 9:17 Am to [facility]GAN DORT, PA , who verbally acknowledged these results.   Electronically Signed   By: Francene BoyersJames  Maxwell M.D.   On: 07/27/2014 10:13    Anti-infectives: Anti-infectives    Start     Dose/Rate Route Frequency Ordered Stop   07/18/14 1800  vancomycin (VANCOCIN) 50 mg/mL oral solution 125 mg     125 mg Oral 4 times per day 07/18/14 1455     07/17/14 1830  vancomycin (VANCOCIN) IVPB 750 mg/150 ml premix     750 mg 150 mL/hr over 60 Minutes Intravenous  Once 07/17/14 1818 07/18/14 0024   07/15/14 1400  metroNIDAZOLE (FLAGYL) tablet 500 mg  Status:  Discontinued     500 mg Oral 3 times per day 07/15/14 1202 07/18/14 1533   07/13/14 2200  piperacillin-tazobactam (ZOSYN) IVPB 2.25 g  Status:  Discontinued     2.25 g 100 mL/hr over 30 Minutes Intravenous 3 times per day 07/13/14 1318 07/24/14 1138   07/13/14 2000  fluconazole (DIFLUCAN) IVPB 200 mg  Status:  Discontinued     200 mg 100 mL/hr over 60 Minutes Intravenous Every 24 hours 07/13/14 1318 07/19/14 1027   07/07/14 1200  vancomycin (VANCOCIN) IVPB 1000 mg/200 mL premix  Status:  Discontinued     1,000 mg 200 mL/hr over 60 Minutes Intravenous Every 24 hours 07/07/14 0937 07/14/14 1712   07/06/14 2000  fluconazole (DIFLUCAN) IVPB 400 mg  Status:  Discontinued     400 mg 100 mL/hr over 120 Minutes Intravenous Every 24 hours 07/06/14 1507 07/13/14 1318   07/06/14 1800  piperacillin-tazobactam (ZOSYN) IVPB 2.25 g  Status:  Discontinued     2.25 g 100 mL/hr over 30 Minutes Intravenous 4 times per day 07/06/14 1505 07/13/14 1318   07/05/14 2000  fluconazole (DIFLUCAN) IVPB 200 mg   Status:  Discontinued     200 mg 100 mL/hr over 60 Minutes Intravenous Every 24 hours 07/05/14 0757 07/06/14 1507   07/05/14 1400  piperacillin-tazobactam (ZOSYN) IVPB 2.25 g  Status:  Discontinued     2.25 g 100 mL/hr over 30 Minutes Intravenous 3 times per day 07/05/14 0749 07/06/14 1505   07/02/14 1800  vancomycin (VANCOCIN) IVPB 1000 mg/200 mL premix  Status:  Discontinued     1,000 mg 200 mL/hr over 60 Minutes Intravenous Every 24 hours 07/01/14 1858 07/05/14 0801   07/01/14 2200  piperacillin-tazobactam (ZOSYN) IVPB 3.375 g  Status:  Discontinued     3.375 g 100 mL/hr over 30 Minutes Intravenous 4 times per day 07/01/14 1858 07/05/14  1610   07/01/14 2000  fluconazole (DIFLUCAN) IVPB 400 mg  Status:  Discontinued     400 mg 100 mL/hr over 120 Minutes Intravenous Every 24 hours 07/01/14 1858 07/05/14 0757   07/01/14 1400  fluconazole (DIFLUCAN) IVPB 200 mg  Status:  Discontinued     200 mg 100 mL/hr over 60 Minutes Intravenous Every 24 hours 07/01/14 1330 07/01/14 1853   07/01/14 1000  piperacillin-tazobactam (ZOSYN) IVPB 2.25 g  Status:  Discontinued     2.25 g 100 mL/hr over 30 Minutes Intravenous Every 8 hours 07/01/14 0952 07/01/14 1853   07/01/14 1000  vancomycin (VANCOCIN) 500 mg in sodium chloride 0.9 % 100 mL IVPB     500 mg 100 mL/hr over 60 Minutes Intravenous  Once 07/01/14 0952 07/01/14 1126   06/30/14 1200  vancomycin (VANCOCIN) IVPB 1000 mg/200 mL premix     1,000 mg 200 mL/hr over 60 Minutes Intravenous  Once 06/30/14 0944 06/30/14 1403   06/30/14 1200  ceFEPIme (MAXIPIME) 2 g in dextrose 5 % 50 mL IVPB     2 g 100 mL/hr over 30 Minutes Intravenous  Once 06/30/14 0944 06/30/14 1425   06/30/14 0100  vancomycin (VANCOCIN) 2,000 mg in sodium chloride 0.9 % 500 mL IVPB     2,000 mg 250 mL/hr over 120 Minutes Intravenous  Once 06/30/14 0048 06/30/14 0525   06/30/14 0100  ceFEPIme (MAXIPIME) 2 g in dextrose 5 % 50 mL IVPB     2 g 100 mL/hr over 30 Minutes Intravenous   Once 06/30/14 0048 06/30/14 0322   06/27/14 0600  cefUROXime (ZINACEF) 1.5 g in dextrose 5 % 50 mL IVPB     1.5 g 100 mL/hr over 30 Minutes Intravenous On call to O.R. 06/26/14 1019 06/27/14 0630   06/19/14 1800  oseltamivir (TAMIFLU) capsule 30 mg     30 mg Oral Every M-W-F (1800) 06/19/14 1003 06/22/14 1841   06/18/14 1600  oseltamivir (TAMIFLU) capsule 30 mg  Status:  Discontinued     30 mg Oral Daily 06/18/14 1538 06/19/14 1003       Assessment/Plan POD #26 s/p Exploratory Laparotomy with graham patch closure of perforated pyloric ulcer, SBR - 07/01/2014 - Ingram Intra-abdominal abscess Now with duodenal fistula -CT 07/24/14 showed 6.6x3.4cm pre-rectal fluid collection. Theycould not rule out gastric leak because contrast did not fill stomach.  Antibiotics from abdominal perspective have been stopped -IR gluteal perc drain (in pelvis) was pulled out accidentally when she got out of bed. Surgical blake drain has significantly increased output (335mL/24hr), green serous drainage. Wound vac has 91mL/24hr.  Fistula study done today and shows drain connection to the duodenal bulb.   -Secondary to above we need to make her NPO with TPN, however, she may need palliative care consult to discuss her wishes.  Her leak is controlled at this point with the blake drain.  If a comfort care approach is desired then she could continue to eat.   -VAC back on MWF, check wound on Monday -I&Os and routine drain care  VTE prophylaxis-SCDs ID--Zosyn stopped on day #23 (07/24/14) C diff + -- Oral vanc New ESRD PCM- tolerating POs, but appetite is poor Dispo-continue inpatient    LOS: 40 days    DORT, Torres Hardenbrook 07/27/2014, 10:48 AM Pager: [phone]

## 2014-07-27 NOTE — Progress Notes (Signed)
Patient scheduled to have a CT angio with IV contrast to r/o PE. IV team has been unable to place a 20G PIV on patient for contrast. Dr. Allena KatzPatel stated it was fine to use her HD catheter for IV contrast, if needed. Per CT, they are unable to use patient's central line OR dialysis catheter because neither of these lines are "pressure rated" for the rate/pressure of IV contrast. Patient is unable to have a PICC line or midline catheter placed due to being on HD. IV team to reevalaute patient for a PIV again. Dr. Gwenlyn PerkingMadera made aware of all of the above.  Leanna BattlesEckelmann, Lenka Zhao Eileen, RN.

## 2014-07-27 NOTE — Progress Notes (Signed)
Subjective:  Flat in bed, on O2, denies increased SOB  Objective:  Vital Signs in the last 24 hours: Temp:  [97.6 F (36.4 C)-98.1 F (36.7 C)] 97.6 F (36.4 C) (04/25 0605) Pulse Rate:  [84] 84 (04/25 0605) Resp:  [14-17] 14 (04/25 0605) BP: (96-115)/(63-83) 96/63 mmHg (04/25 0605) SpO2:  [89 %-100 %] 95 % (04/25 0605) Weight:  [204 lb 4.8 oz (92.67 kg)] 204 lb 4.8 oz (92.67 kg) (04/25 0605)  Intake/Output from previous day:  Intake/Output Summary (Last 24 hours) at 07/27/14 0653 Last data filed at 07/27/14 0600  Gross per 24 hour  Intake    660 ml  Output    365 ml  Net    295 ml    Physical Exam: General appearance: alert, cooperative, no distress and mildly obese Neck: no carotid bruit and no JVD Lungs: decreased breath sounds Heart: regular rate and rhythm Abdomen: soft, non tender No sig edema   Rate: 80  Rhythm: normal sinus rhythm  Lab Results:  Recent Labs  07/24/14 1418  WBC 9.4  HGB 9.1*  PLT 308    Recent Labs  07/24/14 1419  NA 135  K 4.3  CL 96  CO2 25  GLUCOSE 100*  BUN 25*  CREATININE 5.62*   No results for input(s): TROPONINI in the last 72 hours.  Invalid input(s): CK, MB No results for input(s): INR in the last 72 hours.  Scheduled Meds: . amiodarone  200 mg Oral BID  . budesonide (PULMICORT) nebulizer solution  0.25 mg Nebulization BID  . darbepoetin (ARANESP) injection - DIALYSIS  150 mcg Intravenous Q Mon-HD  . feeding supplement (NEPRO CARB STEADY)  237 mL Oral BID BM  . feeding supplement (PRO-STAT SUGAR FREE 64)  30 mL Oral TID BM  . feeding supplement (RESOURCE BREEZE)  1 Container Oral Q1500  . insulin aspart  0-9 Units Subcutaneous TID WC  . ipratropium  0.5 mg Nebulization BID  . levalbuterol  0.63 mg Nebulization BID  . levothyroxine  25 mcg Oral QAC breakfast  . metoprolol tartrate  25 mg Oral BID  . midodrine  10 mg Oral BID WC  . multivitamin  1 tablet Oral QHS  . sodium chloride  10-40 mL Intracatheter  Q12H  . vancomycin  125 mg Oral 4 times per day   Continuous Infusions:  PRN Meds:.sodium chloride, sodium chloride, sodium chloride, sodium chloride, anticoagulant sodium citrate, bisacodyl, fentaNYL (SUBLIMAZE) injection, HYDROcodone-acetaminophen, morphine injection, ondansetron (ZOFRAN) IV, sodium chloride   Imaging: Imaging results have been reviewed  Cardiac Studies: Echo 4/21 Study Conclusions  - Left ventricle: The cavity size was normal. There was moderate concentric hypertrophy. Systolic function was normal. The estimated ejection fraction was in the range of 60% to 65%. Wall motion was normal; there were no regional wall motion abnormalities. Doppler parameters are consistent with abnormal left ventricular relaxation (grade 1 diastolic dysfunction). There was no evidence of elevated ventricular filling pressure by Doppler parameters. - Ventricular septum: The contour showed diastolic flattening and systolic flattening consistent with right ventricular volume and pressure overload . - Aortic valve: Trileaflet; mildly thickened, mildly calcified leaflets. Transvalvular velocity was within the normal range. There was no stenosis. There was no regurgitation. - Mitral valve: Mildly thickened leaflets . There was no regurgitation. - Right ventricle: The cavity size was severely dilated. Wall thickness was normal. Systolic function was mildly to moderately reduced. - Right atrium: The atrium was severely dilated. - Tricuspid valve: There was moderate-severe  regurgitation. - Pulmonic valve: There was no regurgitation. - Pulmonary arteries: The main pulmonary artery was normal-sized. Systolic pressure was severely increased. PA peak pressure: 90 mm Hg (S). - Inferior vena cava: The vessel was normal in size. - Pericardium, extracardiac: There was no pericardial effusion.  Impressions:  - Normal LV systolic function, impaired relaxation with  normal filling pressures. Severely dilated right sided chamber and severe pulmonary hypertension consistent with cor pulmonale.  Assessment/Plan:  59 year old female with HTN and CRI admitted 06/17/14 with respiratory failure secondary to flu.  She is now ESRD on HD. She was noted to have weakness on her left side during hospital stay with CT scan demonstrating acute infarct of right anterior cerebral artery territory. Since then she has had a prolonged hospitalization complicated by perforated pyloric ulcer requiring laparotomy and bowel resection on 06/21/14, HIT, and PAT- NSR now on Amiodarone. She had an echo 07/23/14 for a new rub that showed markedly elevated Rt heart pressures. VQ was intermediate for PE. She was not placed on Heparin secondary to her history of HIT.    Principal Problem:   Acute respiratory failure with hypoxia Active Problems:   Cerebral thrombosis with cerebral infarction   ESRD needing dialysis   Renal failure (ARF), acute on chronic   Hypertension   H1N1 influenza   PSVT (paroxysmal supraventricular tachycardia)   SOB (shortness of breath)   Perforated gastric ulcer   History of intermediate V/Q scan   HIT (heparin-induced thrombocytopenia)   Obesity (BMI 30-39.9)   Depression   Left renal mass   Chronic diastolic heart failure   Tobacco use disorder   Hyperlipidemia   Abnormal TSH   PLAN: Plan was for CTA but problems with IV access. Abnormal TSH noted- will need to follow this on Amiodarone. Her rhythm has been stable the last 48 hrs.   Corine Shelter PA-C Beeper [phone] 07/27/2014, 6:53 AM  Patient seen and examined. Agree with assessment and plan. No chest pain. Breathing better. Cardiac rhythm is stable with amiodarone. No ectopy or recurrent SVT. For f/u assessment of RUQ drain today and ? CT angio with indeterminant VQ scan if assess issue resolved.   Lennette Bihari, MD, [facility] 07/27/2014 8:26 AM

## 2014-07-27 NOTE — Progress Notes (Signed)
Subjective:  Back from Radiology / co some abd discomfort sec to moving /ate some Brk. / for HD today  Objective Vital signs in last 24 hours: Filed Vitals:   07/26/14 1928 07/26/14 2027 07/27/14 0605 07/27/14 1000  BP:  115/83 96/63 98/77   Pulse:  84 84 85  Temp:  98 F (36.7 C) 97.6 F (36.4 C) 97.8 F (36.6 C)  TempSrc:  Oral Oral Oral  Resp:  16 14 16   Height:      Weight:   92.67 kg (204 lb 4.8 oz)   SpO2: 95% 100% 95% 97%   Weight change:   Physical Exam:  General:  Alert, NAD,chronically ill Heart: RRR Lungs: Decr BS at Bases otherwise  CTA   Abdomen: VAC in place midline, NT BS pos. Extremities: trace pedal left/ None  on right .   Dialysis Access: left AVF + bruit and right IJ   OP Dialysis Orders: new start, on MWF schedule here. Lowest wt here 90kg approx  Problem/Plan: 1. New ESRD - on [facility] /  started HD 3/17,  R IJ catheter used sec.  difficulties using AVF;   will try to do next HD in a recliner to see if she can tolerate./  2. Vol excess - cont vol reduction with HD 3.3 off Mon 3.4 off Wed, . Improving overall; mostly dependent central edema-  And depends on positioning in bed 3. Dialysis access - AVF @ [facility] placed 01/28/14 by Dr. Darrick PennaFields, functioned poorly, revision pending as outpatient vs doing revision prior to d/c; using R IJ catheter placed by Dr. Hart RochesterLawson 3/26.  4. Perforated pyloric ulcer / necrotic bowel - s/p repair of perforated ulcer & partial ileum resection 3/30, off Zosyn. Per surgery/ on solid diet  5. Recurrent sepsis - with percutaneous drain (placed 4/8) of pelvic fluid; repeat abdominal CT  Today results pending / CT on 4/22 showed no fluid collection at drain site, but some ^ in collection at prior transglut perc site 6. C diff colitis - on PO Vancomycin./ she denies Diarrhea this am  7. 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 8. Hypotension /right heart failure - on midodrine 9. Anemia - Hgb 9.1,  This am pending pre hd/s/p 2  U PRBCs 4/13, on Aranesp 150 mcg on Mon; Fe 5% on 3/18.- repeat Fe studies next today  10. Sec HPT - am labs pend/ last  Corr Ca 10 P 6.4 , iPTH 338 - not on Hectorol - not  on binders for now/ fu phos   11. PCM - Alb 1.7, diet liberalized to regular+ prostat and nepro / renal vit /  12. Recurrent PSVT on amio- per Cardiology likely PAT vs PJRT- cards noted pericardial rub - Echo =no pericardial effusion/Mod-severe TR, normal LV systolic fx, severe R heart and severe pul HTN c/w cor pulmonale 13.  Hx H1N1 Influenza with resp failure/ no coughing or sob this am  14. Disp - severely deconditioned; LTAC recommended bu" Medicaid will not pay for this -" we need to be sure she can tolerate HD in a recliner prior to d/c to SNF 15. Hx of HIT + antibody 4/1 mildly + 0.53 - no other tests done - platelets wnl now; not clear if true HIT + - will repeat but continue on no heparin HD for now  Lenny Pastelavid Takeem Krotzer, PA-C [facility] Beeper [phone] 07/27/2014,10:16 AM  LOS: 40 days   Labs: Basic Metabolic Panel:  Recent Labs Lab 07/20/14  1040 07/21/14 0508 07/22/14 0450 07/24/14 1419  NA 134* 138 137 135  K 4.1 3.7 3.8 4.3  CL 96 98 98 96  CO2 GLUCOSE 116* 94 138* 100*  BUN 65* 26* 34* 25*  CREATININE 6.48* 3.89* 5.40* 5.62*  CALCIUM 7.9* 7.8* 8.0* 8.2*  PHOS 7.9* 4.6  --  6.4*   Liver Function Tests:  Recent Labs Lab 07/21/14 0508 07/22/14 0450 07/24/14 1419  AST  --  25  --   ALT  --  23  --   ALKPHOS  --  136*  --   BILITOT  --  1.1  --   PROT  --  6.0  --   ALBUMIN 1.7* 1.7* 1.7*   CBC:  Recent Labs Lab 07/20/14 1040 07/21/14 0508 07/22/14 0840 07/24/14 1418  WBC 10.0 9.3 8.6 9.4  HGB 9.2* 9.3* 9.2* 9.1*  HCT 28.7* 29.2* 28.4* 29.4*  MCV 87.2 88.2 87.7 92.5  PLT 196 225 264 308   Cardiac Enzymes: No results for input(s): CKTOTAL, CKMB, CKMBINDEX, TROPONINI in the last 168 hours. CBG:  Recent Labs Lab 07/26/14 1232 07/26/14 1834  07/26/14 1907 07/26/14 2026 07/27/14 0800  GLUCAP 95 65* 55* 140* 70    Studies/Results: Nm Pulmonary Perf And Vent  07/25/2014   CLINICAL DATA:  Short of breath.  Dialysis patient.  EXAM: NUCLEAR MEDICINE VENTILATION - PERFUSION LUNG SCAN  TECHNIQUE: Ventilation images were obtained in multiple projections using inhaled aerosol technetium 99 M DTPA. Perfusion images were obtained in multiple projections after intravenous injection of Tc-23m MAA.  RADIOPHARMACEUTICALS:  40 mCi Tc-82m DTPA aerosol and 6 mCi Tc-39m MAA  COMPARISON:  CXR 07/14/2014  FINDINGS: Ventilation: 2 segment perfusion defect left lower lobe is matched with the ventilation study. Patchy ventilation on the right with a subsegmental defect in the right lung base  Perfusion: Normal perfusion of the right lung. Two segment matched perfusion defect left lower lobe.  IMPRESSION: Intermediate probability for pulmonary embolism.   Electronically Signed   By: Marlan Palau M.D.   On: 07/25/2014 14:20   Medications:   . amiodarone  200 mg Oral BID  . budesonide (PULMICORT) nebulizer solution  0.25 mg Nebulization BID  . darbepoetin (ARANESP) injection - DIALYSIS  150 mcg Intravenous Q Mon-HD  . feeding supplement (NEPRO CARB STEADY)  237 mL Oral BID BM  . feeding supplement (PRO-STAT SUGAR FREE 64)  30 mL Oral TID BM  . feeding supplement (RESOURCE BREEZE)  1 Container Oral Q1500  . insulin aspart  0-9 Units Subcutaneous TID WC  . ipratropium  0.5 mg Nebulization BID  . levalbuterol  0.63 mg Nebulization BID  . levothyroxine  25 mcg Oral QAC breakfast  . metoprolol tartrate  25 mg Oral BID  . midodrine  10 mg Oral BID WC  . multivitamin  1 tablet Oral QHS  . sodium chloride  10-40 mL Intracatheter Q12H  . vancomycin  125 mg Oral 4 times per day

## 2014-07-27 NOTE — Progress Notes (Signed)
Progress Note [facility].   [name] ZOX:[id number] DOB: Mar 04, 1956 DOA: 06/17/2014 PCP: [name], MD  Admit HPI / Brief Narrative: 59 yo female smoker with hx HTN, ESRD on HD (just began this admit), COPD, failed L AV fistula, initially admitted 3/16 with flu and acute hypoxic resp failure. Ultimately tx to [facility] due to need for HD. 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   Patient on 3/31 was taking to the OR for ex lap for per pyloric ulcer and sm bowel resection. Patient develops septic shock on 4-1, she was started on pressors and wean off pressors on 4-3. Subsequently she was diagnosed with pelvic abscess by CT scan perform on 4-7. She underwent pelvic drain placement by IR. Patient also develops C. diff diagnosed 4-12.  C diff was not responding flagyl, and she was started on vancomycin 4-16. She also develops  run of SVT. Cardiology has been helping with management.   HPI/Subjective: Diarrhea resolving. No fever, no CP. Patient feeling bad, complaining of nausea and with abd pain/distension. Crying and asking to be discharge to go home with daughter. Overall prognosis is very poor  Assessment/Plan: C. difficile colitis C diff positive on 4-12.  Received 4 days of flagyl without improvement.  Will count as day 4/14 for oral vanc  Acute hypoxic respiratory failure secondary to H1 N1 influenza +/- HCAP Stable/resolved Has completed antibiotics and resp is stable Still on zosyn for abd abscess  Feeling less SOB on exam Mild exp wheezing appreciated No accessory muscles use; requiring 6L of oxygen  -will continue use pulmicort and continue using flutter valve/pulmonary hygiene and xopenex  PSVT/concerns for pulmonary embolism given extremely high heart right side pressure Has been evaluated by EP - TSH is slightly elevated; will check Free T4 and T3 Cardiology following - follow K+ and Mg  Continue with metoprolol and amiodarone; cardiology  adjusting medications.  2-D echo to be repeated, with concerns for pericardial rub Echo demonstrated extremely elevated heart right side pressure and moderately reduce function of RV VQ scan with intermediate probability for pulmonary embolism; given patient history of mild positive hit test and thrombocytopenia would like to be sure if she had PE before starting anticoagulation -in the process of acquiring CT angio chest to proof if she really has PE; high risk for bleeding given positive HIT during this admission   Small PFO Has been evaluated by Cardiology No need for closure currently  ACA CVA with Lt sided weakness Will need ongoing long term rehab - PT/OT to cont to follow while inpatient  ESRD new start HD this admit  Ongoing hemodialysis per Nephrology - perm-cath placed 3/26 d/t AVF problems Electrolytes abnormalities corrected with dialysis.  Patient to have AV fistula reviewed at some point for permanent access (most likely in outpatient setting) .  Status post exploratory laparotomy with closure of perforated pyloric ulcer/abdominal abscess / small bowel resection w/ postop ileus and now with fistula General Surgery continues to follow Due to Fistula is back to NPO state and with plans to pursuit temporary TPN if decision is for aggressive management; vs palliative care and comfort feeding Complaining of nausea, but not vomiting  Pelvic abscess status post percutaneous drain placement 4/8; now with leakage from anastomosis site -Ongoing care per general surgery -Day 23 of zosyn.  -Received  18 days of vancomycin and  18 days of fluconazole.  -Body fluid grew lactobacillus species.  -Will follow recommendations from [facility];  but right know given leakage will need to be NPO and back to TPN. -there is not plan for this to fix soon and TPN not a long term sustainable option for nutrition -Palliative care consulted  Chronic diastolic congestive heart failure w/ Severe RHF EF  60-65%, grade 1 diastolic dysfunction. Appears compensated.  Fluids/volume manage with hemodialysis.  Concerns for PE on 2-D echo  Anemia of critical illness and chronic disease Hgb presently stable s/p 2U PRBC 4/13 - follow  HB remains stable.  Aranesp/iron per renal discretion   Thrombocytopenia Appears to be associated with sepsis plt count now within normal limits follow trend No signs of overt bleeding currently Has had mild positive HIT test during this admission   Asthma/COPD Patient with mild wheezing on exam; but fair air movement Continue oxygen supplementation as needed Will continue pulmicort Continue as needed xopenex  Diabetes mellitus 2 CBG reasonably controlled - cont to follow  No further Hypoglycemic event; but CBG's in the 70-80's Patient eating poorly; appetite is still not good  Abnormal TSH -Free T4 WNL -T3 1.8 -most likely sick thyroid with ongoing infection and body stress; also due to amiodarone use. TSH in 7 range -will continue low dose synthroid given PSVT  Obesity -  Body mass index is 32.99 kg/(m^2).  Code Status: FULL Family Communication: no family present at time of exam Disposition Plan:  continue PT/OT while inpatient as availability allows. Will need long-term rehabilitation at SNF; once able to tolerate HD on recliner and with improve nutrition.    Consultants: Cardiology Nephrology PCCM Gen Surgery   Significant Events: 3/16 influenza A+ 3/18 R ACA stroke 3/19 MRI multifocal acute infarction of both hemispheres, large right distal ACA 3/22 TEE severe RAE, severely reduced RV function, severe TR, no LAA thrombus, small PFO on TEE 3/31 to OR ex lap for per pyloric ulcer and sm bowel resection 4/1 septic shock on pressors 4/3 pressors weaned off  4/4 restart pressors, CRRT 4/6 levo switched to neo 4/7 pelvic abscess on CT 4/8 IR placed pelvic drain 4/9 off pressors 4/11 long run of SVT. 4/23 VQ scan which demonstrated  intermediate probability for PE 4/24 CT angiogram of her chest: Pending/unable to be done due to lack of access   Antibiotics: Zosyn >stopped on 4/22 (received a total of 23 days of treatment ) Oral Flagyl 4/13 >  DVT prophylaxis: SCDs  Objective: Blood pressure 98/77, pulse 85, temperature 97.8 F (36.6 C), temperature source Oral, resp. rate 16, height  (1.676 m), weight 92.67 kg (204 lb 4.8 oz), SpO2 97 %.  Intake/Output Summary (Last 24 hours) at 07/27/14 1612 Last data filed at 07/27/14 1421  Gross per 24 hour  Intake    780 ml  Output    340 ml  Net    440 ml   Exam: General: No fever; patient continued complaining of mild to moderate abdominal discomfort and is on 6L of oxygen [place]. Denies CP at this moment. Appetite is very poor. Positive leakage/fistula as demonstrated on injected drain test. Also w/o access to performed CT angio Lungs: scattered rhonchi, mild expiratory wheezes and fair air movement, no frank crackles  Cardiovascular: Regular rate, no murmurs and no gallops   Abdomen: Abdominal wound dressing, clean and dry; abdomen slightly distended, drain intact and with serous/greenigh drainage; patient reports mild pain with palpation; positive guarding Extremities: No significant cyanosis, clubbing, edema bilateral lower extremities  Data Reviewed: Basic Metabolic Panel:  Recent Labs Lab 07/21/14 0508 07/22/14  0450 07/24/14 1419 07/27/14 1215  NA 138 137 135 137  K 3.7 3.8 4.3 4.4  CL 98 98 96 93*  CO2 25 22 25 27   GLUCOSE 94 138* 100* 93  BUN 26* 34* 25* 29*  CREATININE 3.89* 5.40* 5.62* 7.02*  CALCIUM 7.8* 8.0* 8.2* 8.5  MG  --  1.9  --   --   PHOS 4.6  --  6.4*  --     Liver Function Tests:  Recent Labs Lab 07/21/14 0508 07/22/14 0450 07/24/14 1419  AST  --  25  --   ALT  --  23  --   ALKPHOS  --  136*  --   BILITOT  --  1.1  --   PROT  --  6.0  --   ALBUMIN 1.7* 1.7* 1.7*   CBC:  Recent Labs Lab 07/21/14 0508 07/22/14 0840  07/24/14 1418 07/27/14 1215  WBC 9.3 8.6 9.4 11.4*  HGB 9.3* 9.2* 9.1* 9.3*  HCT 29.2* 28.4* 29.4* 31.0*  MCV 88.2 87.7 92.5 93.4  PLT 225 264 308 288    CBG:  Recent Labs Lab 07/26/14 1834 07/26/14 1907 07/26/14 2026 07/27/14 0800 07/27/14 1234  GLUCAP 65* 55* 140* 70 82    Scheduled Meds:  Scheduled Meds: . amiodarone  200 mg Oral BID  . budesonide (PULMICORT) nebulizer solution  0.25 mg Nebulization BID  . darbepoetin (ARANESP) injection - DIALYSIS  150 mcg Intravenous Q Mon-HD  . feeding supplement (NEPRO CARB STEADY)  237 mL Oral BID BM  . feeding supplement (PRO-STAT SUGAR FREE 64)  30 mL Oral TID BM  . feeding supplement (RESOURCE BREEZE)  1 Container Oral Q1500  . insulin aspart  0-9 Units Subcutaneous TID WC  . ipratropium  0.5 mg Nebulization BID  . levalbuterol  0.63 mg Nebulization BID  . levothyroxine  25 mcg Oral QAC breakfast  . metoprolol tartrate  25 mg Oral BID  . midodrine  10 mg Oral BID WC  . multivitamin  1 tablet Oral QHS  . sodium chloride  10-40 mL Intracatheter Q12H  . vancomycin  125 mg Oral 4 times per day    Time spent on care of this patient: 35 minutes   Vassie LollMadera, Micajah Dennin , MD  [phone] [facility] Office  [phone] Pager - Text Page per Amion as per below:  On-Call/Text Page:      [email or web address]      password [email or web address]  If 7PM-7AM, please contact night-coverage [email or web address] Password [email or web address] 07/27/2014, 4:12 PM   LOS: 40 days

## 2014-07-27 NOTE — Progress Notes (Addendum)
Late entry:   Hypoglycemic Event  CBG: 55  at 1907  Treatment: D50 IV 25 mL  Symptoms: None  Follow-up CBG Time:2026 CBG Result:140  Possible Reasons for Event: Inadequate meal intake      [name], [name]  Remember to initiate Hypoglycemia Order Set & complete

## 2014-07-28 LAB — GLUCOSE, CAPILLARY
GLUCOSE-CAPILLARY: 94 mg/dL (ref 70–99)
GLUCOSE-CAPILLARY: 81 mg/dL (ref 70–99)
Glucose-Capillary: 102 mg/dL — ABNORMAL HIGH (ref 70–99)
Glucose-Capillary: 79 mg/dL (ref 70–99)

## 2014-07-28 LAB — HEPARIN INDUCED THROMBOCYTOPENIA PNL: Heparin Induced Plt Ab: 0.191 OD (ref 0.000–0.400)

## 2014-07-28 MED ORDER — DIPHENHYDRAMINE HCL 50 MG/ML IJ SOLN
12.5000 mg | Freq: Four times a day (QID) | INTRAMUSCULAR | Status: DC | PRN
  Administered 2014-07-28 – 2014-09-08 (×52): 12.5 mg via INTRAVENOUS
  Filled 2014-07-28 (×53): qty 1

## 2014-07-28 NOTE — Progress Notes (Signed)
Unable to obtain IV access, 2RN attempted and patient refuses any more sticks at present time.

## 2014-07-28 NOTE — Progress Notes (Signed)
Orders received for PICC line and TPN per pharmacy ~1300. Spoke with IV team and it is questionable whether patient will receive PICC line today. Pt also does not have any labs drawn today either. Discussed with Dr. Gwenlyn PerkingMadera who okayed starting TPN tomorrow once PICC line and labs available.  Will order labs for in the a.m. and f/u PICC line placement.  Christoper Fabianaron Carlyn Mullenbach, PharmD, BCPS Clinical pharmacist, pager [phone] 07/28/2014 1:25 PM

## 2014-07-28 NOTE — Progress Notes (Signed)
NUTRITION FOLLOW-UP   DOCUMENTATION CODES Per approved criteria  -Obesity Unspecified   INTERVENTION: TPN per pharmacy.  Discontinue nutritional supplements.  RD to continue to monitor.   NUTRITION DIAGNOSIS: Inadequate oral intake related to altered GI function as evidenced by limited intake, ongoing.   Goal: Pt to meet >/= 90% of their estimated nutrition needs, not met  Monitor:  TPN, weight trends, labs, I/O's  ASSESSMENT: Pt with CKD and hypertension presents with of shortness of breath and generalized weakness x 2 weeks. Pt found to have metabolic acidosis from worsening uremia, H1N1 flu. Pt started on HD. 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  Pt s/p exp lab, evacuation of ascites, closure of ulcer, 14 in resection of ileum due to perforated pyloric ulcer with diffuse peritonitis, ischemic necrosis of mid ileum. Pt extubated 4/2.  Pt previously on CRRT transitioned to IHD 4/12. TPN d/c'ed 4/15.  48 hour calorie count done 4/20. Pt has not been meeting calorie needs and continues to have poor PO intake. TF recommended.   RD was consulted for new TPN as pt now with duodenal fistula. Pt is now NPO. RD to discontinue nutritional supplements. Plans for PICC line today to start TPN tomorrow. RD to continue to monitor.   Labs: Low chloride and GFR. High BUN, creatinine.  Height: Ht Readings from Last 1 Encounters:  07/22/14 _0  (1.676 m)    Weight: Wt Readings from Last 1 Encounters:  07/27/14 196 lb 3.4 oz (89 kg)  Usual weight: 220 lb (100 kg) *Weight trending down*  BMI:  Body mass index is 31.68 kg/(m^2). Class I obesity  Re-Estimated Nutritional Needs: Kcal: 2100-2300 Protein: 120-140 grams Fluid: Per MD  Skin: +2 generalized edema, incision on abdomen, stage II pressure ulcer on sacrum  Diet Order: Diet NPO time specified Except for: Ice Chips   Intake/Output Summary (Last 24 hours)  at 07/28/14 1423 Last data filed at 07/28/14 1328  Gross per 24 hour  Intake    480 ml  Output   2527 ml  Net  -2047 ml    Last BM: 4/25  Labs:   Recent Labs Lab 07/22/14 0450 07/24/14 1419 07/27/14 1215  NA 137 135 137  K 3.8 4.3 4.4  CL 98 96 93*  CO2 _1 BUN 34* 25* 29*  CREATININE 5.40* 5.62* 7.02*  CALCIUM 8.0* 8.2* 8.5  MG 1.9  --   --   PHOS  --  6.4*  --   GLUCOSE 138* 100* 93    CBG (last 3)   Recent Labs  07/28/14 0011 07/28/14 0744 07/28/14 1231  GLUCAP 102* 94 79    Scheduled Meds: . amiodarone  200 mg Oral BID  . budesonide (PULMICORT) nebulizer solution  0.25 mg Nebulization BID  . darbepoetin (ARANESP) injection - DIALYSIS  150 mcg Intravenous Q Mon-HD  . feeding supplement (NEPRO CARB STEADY)  237 mL Oral BID BM  . feeding supplement (PRO-STAT SUGAR FREE 64)  30 mL Oral TID BM  . feeding supplement (RESOURCE BREEZE)  1 Container Oral Q1500  . ipratropium  0.5 mg Nebulization BID  . levalbuterol  0.63 mg Nebulization BID  . levothyroxine  25 mcg Oral QAC breakfast  . metoprolol tartrate  25 mg Oral BID  . midodrine  10 mg Oral BID WC  . multivitamin  1 tablet Oral QHS  . sodium chloride  10-40  mL Intracatheter Q12H  . vancomycin  125 mg Oral 4 times per day    Continuous Infusions:   Kallie Locks, MS, RD, LDN Pager # [phone] After hours/ weekend pager # [phone]

## 2014-07-28 NOTE — Progress Notes (Signed)
Progress Note [facility]   Mikeyla C Chappelle ZOX:[id number] DOB: Yosselyn 27, 1957 DOA: 06/17/2014 PCP: [name], MD  Admit HPI / Brief Narrative: 59 yo female smoker with hx HTN, ESRD on HD (just began this admit), COPD, failed L AV fistula, initially admitted 3/16 with flu and acute hypoxic resp failure. Ultimately tx to [facility] due to need for HD. 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   Patient on 3/31 was taking to the OR for ex lap for per pyloric ulcer and sm bowel resection. Patient develops septic shock on 4-1, she was started on pressors and wean off pressors on 4-3. Subsequently she was diagnosed with pelvic abscess by CT scan perform on 4-7. She underwent pelvic drain placement by IR. Patient also develops C. diff diagnosed 4-12.  C diff was not responding flagyl, and she was started on vancomycin 4-16. She also develops  run of SVT. Cardiology has been helping with management. Course complicated with high RV pressure and concerns for PE; also with fistula formation between anastomosis and blake drain, making her back to NPO state and in need of TPN. Family want everything to be done. Palliative care consulted. Currently Full code.  HPI/Subjective: Diarrhea improving/resolved. No fever, no CP. Patient w/o any further vomiting, but still with nausea and abd pain. Overall prognosis is very poor  Assessment/Plan: C. difficile colitis C diff positive on 4-12.  Received 4 days of flagyl without improvement.  Will count as day 5/14 for oral vanc Diarrhea is significant better currently.  Acute hypoxic respiratory failure secondary to H1 N1 influenza +/- HCAP Stable/resolved Has completed antibiotics and resp is stable Still on zosyn for abd abscess  Feeling less SOB on exam; but requiring high amount of oxygen supplementation through [place] (5-6L) Mild exp wheezing appreciated No accessory muscles use; requiring 6L of oxygen  -will continue use pulmicort and continue  using flutter valve/pulmonary hygiene and xopenex  PSVT/concerns for pulmonary embolism given extremely high heart right side pressure Has been evaluated by EP - TSH is slightly elevated; will check Free T4 and T3 Cardiology following - follow K+ and Mg  Continue with metoprolol and amiodarone; cardiology adjusting medications.  2-D echo to be repeated, with concerns for pericardial rub Echo demonstrated extremely elevated heart right side pressure and moderately reduce function of RV VQ scan with intermediate probability for pulmonary embolism; given patient history of mild positive hit test and thrombocytopenia would like to be sure if she had PE before starting anticoagulation -in the process of acquiring CT angio chest to proof if she really has PE; high risk for bleeding given positive HIT during this admission  -CTA to be pursuit once PICC line in place  Small PFO Has been evaluated by Cardiology No need for closure currently If CTA positive for PE, will need anticoagulation  ACA CVA with Lt sided weakness Will need ongoing long term rehab - PT/OT to cont to follow while inpatient Patient remains very weak and deconditioned (needs 2++ assistance to get out of bed; currently found to weak to try HD on chair)  ESRD new start HD this admit  Ongoing hemodialysis per Nephrology - perm-cath placed 3/26 d/t AVF problems Electrolytes abnormalities corrected with dialysis.  Patient to have AV fistula reviewed at some point for permanent access (most likely in outpatient setting) .  Status post exploratory laparotomy with closure of perforated pyloric ulcer/abdominal abscess / small bowel resection w/ postop ileus and now with fistula  General Surgery continues to follow Due to Fistula is back to NPO state and with plans to pursuit temporary TPN if decision is for aggressive management; vs palliative care and comfort feeding Complaining of nausea, but not further vomiting  Pelvic abscess  status post percutaneous drain placement 4/8; now with leakage from anastomosis site -Ongoing care per general surgery -Day 23 of zosyn.  -Received  18 days of vancomycin and  18 days of fluconazole.  -Body fluid grew lactobacillus species.  -Will follow recommendations from [facility]; but right know given leakage will need to be NPO and back to TPN. -there is not plan for this to fix soon and TPN not a long term sustainable option for nutrition -Palliative care consulted, final recommendations pending -per family will like to start TPN -will ask IV team to place PICC  Chronic diastolic congestive heart failure w/ Severe RHF EF 60-65%, grade 1 diastolic dysfunction. Appears compensated.  Fluids/volume manage with hemodialysis.  Concerns for PE on 2-D echo; per discussion with cardiology will hold on anticoagulations until confirmed with CTA  Anemia of critical illness and chronic disease Hgb presently stable s/p 2U PRBC 4/13 - follow  HB remains stable.  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 TSH in 7 range -will continue low dose synthroid given PSVT  Obesity - Inadequate oral intake related to altered GI function as evidenced by limited intake, ongoing.  Body  mass index is 31.68 kg/(m^2). -patient weight trending down -patient unable to tolerate diet and with positive leaking fistula  Code Status: FULL Family Communication: discussed with daughter at bedside Disposition Plan:  continue PT/OT while inpatient as availability allows. Will need long-term rehabilitation at SNF; once able to tolerate HD on recliner and with improve nutrition; last one very dificult as patient with positive leaking fistula and in need of TPN.    Consultants: Cardiology Nephrology PCCM Gen Surgery  Palliative care  Significant Events: 3/16 influenza A+ 3/18 R ACA stroke 3/19 MRI multifocal acute infarction of both hemispheres, large right distal ACA 3/22 TEE severe RAE, severely reduced RV function, severe TR, no LAA thrombus, small PFO on TEE 3/31 to OR ex lap for per pyloric ulcer and sm bowel resection 4/1 septic shock on pressors 4/3 pressors weaned off  4/4 restart pressors, CRRT 4/6 levo switched to neo 4/7 pelvic abscess on CT 4/8 IR placed pelvic drain 4/9 off pressors 4/11 long run of SVT. 4/23 VQ scan which demonstrated intermediate probability for PE 4/24 CT angiogram of her chest: Pending/unable to be done due to lack of access   Antibiotics: Zosyn >stopped on 4/22 (received a total of 23 days of treatment ) Oral Flagyl 4/13 >  DVT prophylaxis: SCDs  Objective: Blood pressure 90/55, pulse 90, temperature 98.2 F (36.8 C), temperature source Oral, resp. rate 17,  height 5\' 6"  (1.676 m), weight 89 kg (196 lb 3.4 oz), SpO2 97 %.  Intake/Output Summary (Last 24 hours) at 07/28/14 1546 Last data filed at 07/28/14 1328  Gross per 24 hour  Intake    480 ml  Output   2527 ml  Net  -2047 ml   Exam: General: No fever; patient continued complaining of mild to moderate abdominal discomfort and is on 5-6L of oxygen [place]. Denies CP at this moment. Appetite remains very poor. Positive leakage/fistula as demonstrated on injected drain test; needing  NPO status and TPN. Also w/o access to performed CT angio or initiate needed TPN Lungs: scattered rhonchi, mild expiratory wheezes and fair air movement, no frank crackles  Cardiovascular: Regular rate, no murmurs and no gallops   Abdomen: Abdominal wound dressing, clean and dry; abdomen slightly distended, drain intact and with serous/greenigh drainage; patient reports mild pain with palpation; positive guarding Extremities: No significant cyanosis, clubbing, edema bilateral lower extremities  Data Reviewed: Basic Metabolic Panel:  Recent Labs Lab 07/22/14 0450 07/24/14 1419 07/27/14 1215  NA 137 135 137  K 3.8 4.3 4.4  CL 98 96 93*  CO2 22 25 27   GLUCOSE 138* 100* 93  BUN 34* 25* 29*  CREATININE 5.40* 5.62* 7.02*  CALCIUM 8.0* 8.2* 8.5  MG 1.9  --   --   PHOS  --  6.4*  --     Liver Function Tests:  Recent Labs Lab 07/22/14 0450 07/24/14 1419  AST 25  --   ALT 23  --   ALKPHOS 136*  --   BILITOT 1.1  --   PROT 6.0  --   ALBUMIN 1.7* 1.7*   CBC:  Recent Labs Lab 07/22/14 0840 07/24/14 1418 07/27/14 1215  WBC 8.6 9.4 11.4*  HGB 9.2* 9.1* 9.3*  HCT 28.4* 29.4* 31.0*  MCV 87.7 92.5 93.4  PLT 264 308 288    CBG:  Recent Labs Lab 07/27/14 0800 07/27/14 1234 07/28/14 0011 07/28/14 0744 07/28/14 1231  GLUCAP 70 82 102* 94 79    Scheduled Meds:  Scheduled Meds: . amiodarone  200 mg Oral BID  . budesonide (PULMICORT) nebulizer solution  0.25 mg Nebulization BID  . darbepoetin (ARANESP) injection - DIALYSIS  150 mcg Intravenous Q Mon-HD  . ipratropium  0.5 mg Nebulization BID  . levalbuterol  0.63 mg Nebulization BID  . levothyroxine  25 mcg Oral QAC breakfast  . metoprolol tartrate  25 mg Oral BID  . midodrine  10 mg Oral BID WC  . multivitamin  1 tablet Oral QHS  . sodium chloride  10-40 mL Intracatheter Q12H  . vancomycin  125 mg Oral 4 times per day    Time spent on care of this patient: 35 minutes   Vassie LollMadera, Caidence Higashi , MD  [phone] [facility] Office  [phone] Pager - Text Page per Amion as per below:  On-Call/Text Page:      [email or web address]      password [email or web address]  If 7PM-7AM, please contact night-coverage [email or web address] Password [email or web address] 07/28/2014, 3:46 PM   LOS: 41 days

## 2014-07-28 NOTE — Progress Notes (Signed)
Consultation Note Date: 07/28/2014   Patient Name: [name]  DOB: 1956-01-27  MRN: [id number]  Age / Sex: 59 y.o., female   PCP: Gwenyth Bender, MD Referring Physician: Vassie Loll, MD  Reason for Consultation: goals of care  Palliative Care Assessment and Plan Summary of Established Goals of Care and Medical Treatment Preferences    Palliative Care Discussion Held Today Contacts/Participants in Discussion: Primary Decision Maker:  Patient. Also in attendance: daughter (POA) and sister   Code Status/Advance Care Planning:  Full code at this time   Symptom Management:   No new complaints: pain remains but is reasonably controlled on present regimen  Palliative Prophylaxis: no needs this encounter  Psycho-social/Spiritual:   Support System: strong family support and community of friends and co-workers  Desire for further Chaplaincy support: NO  Prognosis: guarded to poor in light of multiple comorbidities and current complications  Discharge Planning:  Undetermined at this time       Chief Complaint/History of Present Illness. No change from 4/25 note  Primary Diagnoses  Present on Admission:   Acute respiratory failure with hypoxia  Cerebral thrombosis with cerebral infarction  Hypertension  Obesity (BMI 30-39.9)  Depression  Left renal mass  Chronic diastolic heart failure  H1N1 influenza  Tobacco use disorder  Renal failure (ARF), acute on chronic  Hyperlipidemia  SOB (shortness of breath)  Perforated gastric ulcer  ESRD needing dialysis  Palliative Review of Systems: No change from 4/25  I have reviewed the medical record, interviewed the patient and family, and examined the patient. The following aspects are pertinent.  Past Medical History  Diagnosis Date   Asthma    Hypertension    Gout    Arthritis    Insomnia    Chronic kidney disease    Depression    History   Social History   Marital Status: Single   Spouse Name: N/A   Number of Children: N/A   Years of Education: N/A   Social History Main Topics   Smoking status: Current Every Day Smoker -- 0.50 packs/day for 20 years    Types: Cigarettes   Smokeless tobacco: Never Used   Alcohol Use: Yes     Comment: occ   Drug Use: No   Sexual Activity: Not on file   Other Topics Concern   None   Social History Narrative   Family History  Problem Relation Age of Onset   Diabetes Mother    Hypertension Mother    Heart disease Mother    Heart attack Mother    Peripheral vascular disease Mother    Diabetes Father    Heart disease Father    Hypertension Father    Diabetes Sister    Hypertension Sister    Heart disease Sister     before age 42   Heart attack Sister    Peripheral vascular disease Sister    Heart disease Brother    Hyperlipidemia Daughter    Scheduled Meds:  amiodarone  200 mg Oral BID   budesonide (PULMICORT) nebulizer solution  0.25 mg Nebulization BID   darbepoetin (ARANESP) injection - DIALYSIS  150 mcg Intravenous Q Mon-HD   feeding supplement (NEPRO CARB STEADY)  237 mL Oral BID BM   feeding supplement (PRO-STAT SUGAR FREE 64)  30 mL Oral TID BM   feeding supplement (RESOURCE BREEZE)  1 Container Oral Q1500   ipratropium  0.5 mg Nebulization BID   levalbuterol  0.63 mg Nebulization BID   levothyroxine  25 mcg Oral QAC breakfast   metoprolol tartrate  25 mg Oral BID   midodrine  10 mg Oral BID WC   multivitamin  1 tablet Oral QHS   sodium chloride  10-40 mL Intracatheter Q12H   vancomycin  125 mg Oral 4 times per day   Continuous Infusions:  PRN Meds:.sodium chloride, sodium chloride, sodium chloride, sodium chloride, sodium chloride, sodium chloride, anticoagulant sodium citrate, bisacodyl, feeding supplement (NEPRO CARB STEADY), fentaNYL (SUBLIMAZE) injection, HYDROcodone-acetaminophen, lidocaine (PF), lidocaine-prilocaine, morphine injection, ondansetron (ZOFRAN) IV,  pentafluoroprop-tetrafluoroeth, sodium chloride Medications Prior to Admission:  Prior to Admission medications   Medication Sig Start Date End Date Taking? Authorizing Provider  allopurinol (ZYLOPRIM) 100 MG tablet Take 100 mg by mouth 2 (two) times daily as needed (for gout).   Yes Historical Provider, MD  citalopram (CELEXA) 20 MG tablet Take 20 mg by mouth daily.   Yes Historical Provider, MD  furosemide (LASIX) 80 MG tablet Take 80 mg by mouth daily.   Yes Historical Provider, MD  hydrOXYzine (ATARAX/VISTARIL) 25 MG tablet Take 25 mg by mouth 3 (three) times daily as needed for anxiety.   Yes Historical Provider, MD  oxyCODONE (ROXICODONE) 5 MG immediate release tablet Take 1 tablet (5 mg total) by mouth every 6 (six) hours as needed for severe pain. Patient not taking: Reported on 06/17/2014 01/28/14   Raymond GurneyKimberly A Trinh, PA-C   No Known Allergies CBC:    Component Value Date/Time   WBC 11.4* 07/27/2014 1215   WBC 6.8 10/02/2012 1046   HGB 9.3* 07/27/2014 1215   HGB 13.1 10/02/2012 1046   HCT 31.0* 07/27/2014 1215   HCT 40.3 10/02/2012 1046   PLT 288 07/27/2014 1215   PLT 359 10/02/2012 1046   MCV 93.4 07/27/2014 1215   MCV 82.6 10/02/2012 1046   NEUTROABS 10.7* 07/15/2014 0440   NEUTROABS 3.8 10/02/2012 1046   LYMPHSABS 1.1 07/15/2014 0440   LYMPHSABS 1.9 10/02/2012 1046   MONOABS 1.1* 07/15/2014 0440   MONOABS 0.8 10/02/2012 1046   EOSABS 0.5 07/15/2014 0440   EOSABS 0.2 10/02/2012 1046   BASOSABS 0.0 07/15/2014 0440   BASOSABS 0.1 10/02/2012 1046   Comprehensive Metabolic Panel:    Component Value Date/Time   NA 137 07/27/2014 1215   NA 139 10/02/2012 1046   K 4.4 07/27/2014 1215   K 4.4 10/02/2012 1046   CL 93* 07/27/2014 1215   CO2 27 07/27/2014 1215   CO2 25 10/02/2012 1046   BUN 29* 07/27/2014 1215   BUN 35.2* 10/02/2012 1046   CREATININE 7.02* 07/27/2014 1215   CREATININE 2.5* 10/02/2012 1046   GLUCOSE 93 07/27/2014 1215   GLUCOSE 110 10/02/2012 1046    CALCIUM 8.5 07/27/2014 1215   CALCIUM 9.8 10/02/2012 1046   AST 25 07/22/2014 0450   AST 9 10/02/2012 1046   ALT 23 07/22/2014 0450   ALT <6 Repeated and Verified 10/02/2012 1046   ALKPHOS 136* 07/22/2014 0450   ALKPHOS 108 10/02/2012 1046   BILITOT 1.1 07/22/2014 0450   BILITOT 0.21 10/02/2012 1046   PROT 6.0 07/22/2014 0450   PROT 8.4* 10/02/2012 1046   ALBUMIN 1.7* 07/24/2014 1419   ALBUMIN 3.1* 10/02/2012 1046    Physical Exam: Vital Signs: BP 90/55 mmHg   Pulse 90   Temp(Src) 98.2 F (36.8 C) (Oral)   Resp 17   Ht 5\' 6"  (1.676 m)   Wt 89 kg (196 lb 3.4 oz)   BMI 31.68 kg/m2   SpO2 97% SpO2: SpO2:  97 % O2 Device: O2 Device: Nasal Cannula O2 Flow Rate: O2 Flow Rate (L/min): 6 L/min Intake/output summary:  Intake/Output Summary (Last 24 hours) at 07/28/14 1012 Last data filed at 07/28/14 1007  Gross per 24 hour  Intake    470 ml  Output   2627 ml  Net  -2157 ml   LBM:   Baseline Weight: Weight: 103.3 kg (227 lb 11.8 oz) Most recent weight: Weight: 89 kg (196 lb 3.4 oz)  Exam Findings: General: awake. NO distress Pulm - normal respirations Neuro - not tested but persistent left sided weakness         Palliative Performance Scale: 40%             Additional Data Reviewed: Recent Labs     07/27/14  1215  WBC  11.4*  HGB  9.3*  PLT  288  NA  137  BUN  29*  CREATININE  7.02*   Prolonged conference with patient and family over the course of her hospitalization, current condition and problems including abdominal drain-enteric fistula. Reviewed Code status with full information about poor chance of successful attempt and the complication to expect with successful resuscitation. Discussed long term prognosis as being guarded. Her wishes are to be able to return home.  Mrs. Blum was not ready to make a decision in regard to code status or any change in her course of care but did voice understanding of the content of the conversation about her prognosis and quality of  life issues. She is willing to discuss further.  Time In: 1005 Time Out: 1055 Time Total: 50 min  Greater than 50%  of this time was spent counseling and coordinating care related to the above assessment and plan.  Signed by: Jacques Navy  [No Provider Link Found]  07/28/2014, 10:12 AM  Please contact Palliative Medicine Team phone at [phone] for questions and concerns.

## 2014-07-28 NOTE — Progress Notes (Signed)
[facility] Progress Note  27 Days Post-Op  Subjective: Pt still with pain, no more N/V.  Family at bedside.  Not OOB.  Uses IS some.  Drain with high output and green.  Family at bedside.   Objective: Vital signs in last 24 hours: Temp:  [97.5 F (36.4 C)-98.2 F (36.8 C)] 98.2 F (36.8 C) (04/26 1006) Pulse Rate:  [80-99] 90 (04/26 1006) Resp:  [16-18] 17 (04/26 1006) BP: (85-114)/(51-84) 90/55 mmHg (04/26 1006) SpO2:  [97 %-98 %] 97 % (04/26 1006) Weight:  [89 kg (196 lb 3.4 oz)-91 kg (200 lb 9.9 oz)] 89 kg (196 lb 3.4 oz) (04/25 1922) Last BM Date: 07/27/14  Intake/Output from previous day: 04/25 0701 - 04/26 0700 In: 470 [P.O.:470] Out: 2452 [Drains:450; Stool:2] Intake/Output this shift: Total I/O In: 0  Out: 175 [Drains:175]  PE: Gen:  Alert, NAD, pleasant Abd: Soft, mildly tender over wound vac site, ND, +BS, no HSM, wound vac in place (64mL/24hr), Blake drain with green serous output 438mL/24hr.   Lab Results:   Recent Labs  07/27/14 1215  WBC 11.4*  HGB 9.3*  HCT 31.0*  PLT 288   BMET  Recent Labs  07/27/14 1215  NA 137  K 4.4  CL 93*  CO2 27  GLUCOSE 93  BUN 29*  CREATININE 7.02*  CALCIUM 8.5   PT/INR No results for input(s): LABPROT, INR in the last 72 hours. CMP     Component Value Date/Time   NA 137 07/27/2014 1215   NA 139 10/02/2012 1046   K 4.4 07/27/2014 1215   K 4.4 10/02/2012 1046   CL 93* 07/27/2014 1215   CO2 27 07/27/2014 1215   CO2 25 10/02/2012 1046   GLUCOSE 93 07/27/2014 1215   GLUCOSE 110 10/02/2012 1046   BUN 29* 07/27/2014 1215   BUN 35.2* 10/02/2012 1046   CREATININE 7.02* 07/27/2014 1215   CREATININE 2.5* 10/02/2012 1046   CALCIUM 8.5 07/27/2014 1215   CALCIUM 9.8 10/02/2012 1046   PROT 6.0 07/22/2014 0450   PROT 8.4* 10/02/2012 1046   ALBUMIN 1.7* 07/24/2014 1419   ALBUMIN 3.1* 10/02/2012 1046   AST 25 07/22/2014 0450   AST 9 10/02/2012 1046   ALT 23 07/22/2014 0450   ALT <6 Repeated and  Verified 10/02/2012 1046   ALKPHOS 136* 07/22/2014 0450   ALKPHOS 108 10/02/2012 1046   BILITOT 1.1 07/22/2014 0450   BILITOT 0.21 10/02/2012 1046   GFRNONAA 6* 07/27/2014 1215   GFRAA 7* 07/27/2014 1215   Lipase  No results found for: LIPASE     Studies/Results: Dg Sinus/fist Tube Chk-non Gi  07/27/2014   CLINICAL DATA:  Perforated duodenal ulcer with surgical repair. Surgical drain in the upper abdomen. Continued prominent drainage.  EXAM: ABSCESS INJECTION  TECHNIQUE: I injected 30 cc of Omnipaque 300 into the drainage catheter under direct fluoroscopic visualization. Ms. Sutphen tolerated the procedure well.  CONTRAST:  30mL OMNIPAQUE IOHEXOL 300 MG/ML  SOLN  FLUOROSCOPY TIME:  Fluoroscopy Time (in minutes and seconds): 0 min 36 seconds  COMPARISON:  CT scan dated 07/24/2014  FINDINGS: There is a 2 cm cavity which appears to lie at the superior aspect of the apex of the duodenal bulb, directly communicating with the drainage catheter. This cavity does communicate with the duodenal bulb with contrast entering the stomach and duodenum. There is no free intraperitoneal extravasation of contrast.  IMPRESSION: Contained cavity adjacent to the apex of the duodenal bulb which communicates with the drainage  catheter. No free intraperitoneal leakage.  Critical Value/emergent results were called by telephone at the time of interpretation on 07/27/2014 at 9:17 Am to [facility], [facility] , who verbally acknowledged these results.   Electronically Signed   By: Francene Boyers M.D.   On: 07/27/2014 10:13    Anti-infectives: Anti-infectives    Start     Dose/Rate Route Frequency Ordered Stop   07/18/14 1800  vancomycin (VANCOCIN) 50 mg/mL oral solution 125 mg     125 mg Oral 4 times per day 07/18/14 1455     07/17/14 1830  vancomycin (VANCOCIN) IVPB 750 mg/150 ml premix     750 mg 150 mL/hr over 60 Minutes Intravenous  Once 07/17/14 1818 07/18/14 0024   07/15/14 1400  metroNIDAZOLE (FLAGYL) tablet 500 mg   Status:  Discontinued     500 mg Oral 3 times per day 07/15/14 1202 07/18/14 1533   07/13/14 2200  piperacillin-tazobactam (ZOSYN) IVPB 2.25 g  Status:  Discontinued     2.25 g 100 mL/hr over 30 Minutes Intravenous 3 times per day 07/13/14 1318 07/24/14 1138   07/13/14 2000  fluconazole (DIFLUCAN) IVPB 200 mg  Status:  Discontinued     200 mg 100 mL/hr over 60 Minutes Intravenous Every 24 hours 07/13/14 1318 07/19/14 1027   07/07/14 1200  vancomycin (VANCOCIN) IVPB 1000 mg/200 mL premix  Status:  Discontinued     1,000 mg 200 mL/hr over 60 Minutes Intravenous Every 24 hours 07/07/14 0937 07/14/14 1712   07/06/14 2000  fluconazole (DIFLUCAN) IVPB 400 mg  Status:  Discontinued     400 mg 100 mL/hr over 120 Minutes Intravenous Every 24 hours 07/06/14 1507 07/13/14 1318   07/06/14 1800  piperacillin-tazobactam (ZOSYN) IVPB 2.25 g  Status:  Discontinued     2.25 g 100 mL/hr over 30 Minutes Intravenous 4 times per day 07/06/14 1505 07/13/14 1318   07/05/14 2000  fluconazole (DIFLUCAN) IVPB 200 mg  Status:  Discontinued     200 mg 100 mL/hr over 60 Minutes Intravenous Every 24 hours 07/05/14 0757 07/06/14 1507   07/05/14 1400  piperacillin-tazobactam (ZOSYN) IVPB 2.25 g  Status:  Discontinued     2.25 g 100 mL/hr over 30 Minutes Intravenous 3 times per day 07/05/14 0749 07/06/14 1505   07/02/14 1800  vancomycin (VANCOCIN) IVPB 1000 mg/200 mL premix  Status:  Discontinued     1,000 mg 200 mL/hr over 60 Minutes Intravenous Every 24 hours 07/01/14 1858 07/05/14 0801   07/01/14 2200  piperacillin-tazobactam (ZOSYN) IVPB 3.375 g  Status:  Discontinued     3.375 g 100 mL/hr over 30 Minutes Intravenous 4 times per day 07/01/14 1858 07/05/14 0749   07/01/14 2000  fluconazole (DIFLUCAN) IVPB 400 mg  Status:  Discontinued     400 mg 100 mL/hr over 120 Minutes Intravenous Every 24 hours 07/01/14 1858 07/05/14 0757   07/01/14 1400  fluconazole (DIFLUCAN) IVPB 200 mg  Status:  Discontinued     200  mg 100 mL/hr over 60 Minutes Intravenous Every 24 hours 07/01/14 1330 07/01/14 1853   07/01/14 1000  piperacillin-tazobactam (ZOSYN) IVPB 2.25 g  Status:  Discontinued     2.25 g 100 mL/hr over 30 Minutes Intravenous Every 8 hours 07/01/14 0952 07/01/14 1853   07/01/14 1000  vancomycin (VANCOCIN) 500 mg in sodium chloride 0.9 % 100 mL IVPB     500 mg 100 mL/hr over 60 Minutes Intravenous  Once 07/01/14 0952 07/01/14 1126   06/30/14 1200  vancomycin (VANCOCIN) IVPB 1000 mg/200 mL premix     1,000 mg 200 mL/hr over 60 Minutes Intravenous  Once 06/30/14 0944 06/30/14 1403   06/30/14 1200  ceFEPIme (MAXIPIME) 2 g in dextrose 5 % 50 mL IVPB     2 g 100 mL/hr over 30 Minutes Intravenous  Once 06/30/14 0944 06/30/14 1425   06/30/14 0100  vancomycin (VANCOCIN) 2,000 mg in sodium chloride 0.9 % 500 mL IVPB     2,000 mg 250 mL/hr over 120 Minutes Intravenous  Once 06/30/14 0048 06/30/14 0525   06/30/14 0100  ceFEPIme (MAXIPIME) 2 g in dextrose 5 % 50 mL IVPB     2 g 100 mL/hr over 30 Minutes Intravenous  Once 06/30/14 0048 06/30/14 0322   06/27/14 0600  cefUROXime (ZINACEF) 1.5 g in dextrose 5 % 50 mL IVPB     1.5 g 100 mL/hr over 30 Minutes Intravenous On call to O.R. 06/26/14 1019 06/27/14 0630   06/19/14 1800  oseltamivir (TAMIFLU) capsule 30 mg     30 mg Oral Every M-W-F (1800) 06/19/14 1003 06/22/14 1841   06/18/14 1600  oseltamivir (TAMIFLU) capsule 30 mg  Status:  Discontinued     30 mg Oral Daily 06/18/14 1538 06/19/14 1003       Assessment/Plan POD #27 s/p Exploratory Laparotomy with graham patch closure of perforated pyloric ulcer, SBR - 07/01/2014 - [facility] Intra-abdominal abscess Now with duodenal fistula -CT 07/24/14 showed 6.6x3.4cm pre-rectal fluid collection. They could not rule out gastric leak because contrast did not fill stomach. Antibiotics from abdominal perspective have been stopped -IR gluteal perc drain (in pelvis) was pulled out accidentally when she got out of  bed. Surgical blake drain has significantly increased output (46250mL/24hr), green serous drainage. Wound vac has 810mL/24hr. Fistula study done and shows drain connection to the duodenal bulb.  -Secondary to above we need to make her NPO with TPN, however, she may need palliative care consult to discuss her wishes. Her leak is controlled at this point with the blake drain. If a comfort care approach is desired then she could continue to eat.  -VAC back on MWF -I&Os and routine drain care  VTE prophylaxis-SCDs ID--Zosyn stopped on day #23 (07/24/14) C diff + -- Oral vanc New ESRD PCM- tolerating POs, but appetite is poor Dispo-continue inpatient    LOS: 41 days    DORT, Daysen Gundrum 07/28/2014, 10:46 AM Pager: [phone]

## 2014-07-28 NOTE — Progress Notes (Signed)
Subjective:  Co continued Abd discomfort with any movements/ HD yest. / Noted pal. care seeing pt states to meet with daughter  930am  today  Objective Vital signs in last 24 hours: Filed Vitals:   07/27/14 1900 07/27/14 1910 07/27/14 1922 07/27/14 2015  BP: 102/73 88/68  85/51  Pulse: 97 99  96  Temp:   97.6 F (36.4 C) 97.6 F (36.4 C)  TempSrc:   Oral Oral  Resp:    17  Height:      Weight:   89 kg (196 lb 3.4 oz)   SpO2:   98%    Weight change: -1.67 kg (-3 lb 10.9 oz)  Physical Exam: General: sleeping awoken , Alert, NAD,chronically ill Heart: Tachy 95 to 102 hrt rt with occas irreg.  Lungs: Decr BS at Bases otherwise CTA  Abdomen: VAC in place midline, ND, tender to pal. BS pos. Extremities: no pedal edema .  Dialysis Access: left AVF + bruit and right IJ  OP Dialysis Orders: new start, on MWF schedule here.wt uest 89 kg post  Problem/Plan: 1. New ESRD - on MWF schedule in [place] / started HD 3/17, R IJ catheter used sec. difficulties using AVF; will try to do next HD in a recliner to see if she can tolerate./  2. Vol excess - cont vol reduction with HD 2l off yest with bp pre 114 drop to 80s end of hd  On Midodrine 3. PSVT/concerns for pulmonary embolism given extremely high heart right side pressure/-Small PFO  on amio- per Cardiology likely PAT vs PJRT- cards noted pericardial rub - Echo =no pericardial effusion/Mod-severe TR, normal LV systolic fx, severe R heart and severe pul HTN c/w cor pulmonale/ need for CT Angio chest to ro PE complicated by no avail iv access currently ("not able to use HD cath  Per ct dept") HOWEVER high risk for bleeding given positive HIT during this admission   4. Dialysis access - AVF @ [facility] placed 01/28/14 by Dr. Darrick Penna, functioned poorly, revision pending as outpatient vs doing revision prior to d/c; using R IJ catheter placed by Dr. Hart [place] 3/26.  5. Perforated pyloric ulcer / necrotic bowel - s/p repair of perforated ulcer &  partial ileum resection 3/30, off Zosyn. Per surgery/ yest on solid diet /npo today ? Attempt for Lung scan again 6. Recurrent sepsis - with percutaneous drain (placed 4/8) of pelvic fluid;  CCS following  7. C diff colitis - on PO Vancomycin./ she denies Diarrhea this am  8. 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 9. Hypotension /right heart failure - on midodrine 10. Anemia - Hgb 9.1,>9.3 /s/p 2 U PRBCs 4/13, on Aranesp 150 mcg on Mon; Fe 5% on 3/18.- repeat Fe studies next today  11. Sec HPT - Corr Ca 10.3 P 6.4 , iPTH 338 - not on Hectorol - not on binders for now/ fu phospre hd   12. PCM - Alb 1.7, diet liberalized to regular+ prostat and nepro / renal vit /  13.  H1N1 Influenza with resp failure/- resolved from admit - no coughing or sob this am  14. Disp - severely deconditioned; LTAC recommended bu" Medicaid will not pay for this -" Palliative Care seeing yesterday/need to be sure she can tolerate HD in a recliner prior to d/c to SNF 15. 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 DM type 2 17. Copd/ Asthma  Lenny Pastelavid Ramone Gander, PA-C [facility] Beeper [phone] 07/28/2014,8:51 AM  LOS: 41 days   Labs: Basic Metabolic Panel:  Recent Labs Lab 07/22/14 0450 07/24/14 1419 07/27/14 1215  NA 137 135 137  K 3.8 4.3 4.4  CL 98 96 93*  CO2 22 25 27   GLUCOSE 138* 100* 93  BUN 34* 25* 29*  CREATININE 5.40* 5.62* 7.02*  CALCIUM 8.0* 8.2* 8.5  PHOS  --  6.4*  --    Liver Function Tests:  Recent Labs Lab 07/22/14 0450 07/24/14 1419  AST 25  --   ALT 23  --   ALKPHOS 136*  --   BILITOT 1.1  --   PROT 6.0  --   ALBUMIN 1.7* 1.7*    CBC:  Recent Labs Lab 07/22/14 0840 07/24/14 1418 07/27/14 1215  WBC 8.6 9.4 11.4*  HGB 9.2* 9.1* 9.3*  HCT 28.4* 29.4* 31.0*  MCV 87.7 92.5 93.4  PLT 264 308 288   Cardiac Enzymes: No results for  input(s): CKTOTAL, CKMB, CKMBINDEX, TROPONINI in the last 168 hours. CBG:  Recent Labs Lab 07/26/14 2026 07/27/14 0800 07/27/14 1234 07/28/14 0011 07/28/14 0744  GLUCAP 140* 70 82 102* 94    Studies/Results: Dg Sinus/fist Tube Chk-non Gi  07/27/2014   CLINICAL DATA:  Perforated duodenal ulcer with surgical repair. Surgical drain in the upper abdomen. Continued prominent drainage.  EXAM: ABSCESS INJECTION  TECHNIQUE: I injected 30 cc of Omnipaque 300 into the drainage catheter under direct fluoroscopic visualization. [name] tolerated the procedure well.  CONTRAST:  30mL OMNIPAQUE IOHEXOL 300 MG/ML  SOLN  FLUOROSCOPY TIME:  Fluoroscopy Time (in minutes and seconds): 0 min 36 seconds  COMPARISON:  CT scan dated 07/24/2014  FINDINGS: There is a 2 cm cavity which appears to lie at the superior aspect of the apex of the duodenal bulb, directly communicating with the drainage catheter. This cavity does communicate with the duodenal bulb with contrast entering the stomach and duodenum. There is no free intraperitoneal extravasation of contrast.  IMPRESSION: Contained cavity adjacent to the apex of the duodenal bulb which communicates with the drainage catheter. No free intraperitoneal leakage.  Critical Value/emergent results were called by telephone at the time of interpretation on 07/27/2014 at 9:17 Am to [facility] , who verbally acknowledged these results.   Electronically Signed   By: Francene BoyersJames  Maxwell M.D.   On: 07/27/2014 10:13   Medications:   . amiodarone  200 mg Oral BID  . budesonide (PULMICORT) nebulizer solution  0.25 mg Nebulization BID  . darbepoetin (ARANESP) injection - DIALYSIS  150 mcg Intravenous Q Mon-HD  . feeding supplement (NEPRO CARB STEADY)  237 mL Oral BID BM  . feeding supplement (PRO-STAT SUGAR FREE 64)  30 mL Oral TID BM  . feeding supplement (RESOURCE BREEZE)  1 Container Oral Q1500  . ipratropium  0.5 mg Nebulization BID  . levalbuterol  0.63 mg Nebulization BID   . levothyroxine  25 mcg Oral QAC breakfast  . metoprolol tartrate  25 mg Oral BID  . midodrine  10 mg Oral BID WC  . multivitamin  1 tablet Oral QHS  . sodium chloride  10-40 mL Intracatheter Q12H  . vancomycin  125 mg Oral 4 times per day

## 2014-07-28 NOTE — Progress Notes (Signed)
Subjective:  Flat in bed, on O2, denies increased SOB  Objective:  Vital Signs in the last 24 hours: Temp:  [97.5 F (36.4 C)-97.8 F (36.6 C)] 97.6 F (36.4 C) (04/25 2015) Pulse Rate:  [80-99] 96 (04/25 2015) Resp:  [16-18] 17 (04/25 2015) BP: (85-114)/(51-84) 85/51 mmHg (04/25 2015) SpO2:  [97 %-98 %] 98 % (04/25 1922) Weight:  [196 lb 3.4 oz (89 kg)-200 lb 9.9 oz (91 kg)] 196 lb 3.4 oz (89 kg) (04/25 1922)  Intake/Output from previous day:  Intake/Output Summary (Last 24 hours) at 07/28/14 0759 Last data filed at 07/28/14 0640  Gross per 24 hour  Intake    240 ml  Output   2250 ml  Net  -2010 ml    Physical Exam: General appearance: alert, cooperative, no distress and mildly obese Neck: no carotid bruit and no JVD Lungs: decreased breath sounds Heart: regular rate and rhythm, she has a friction rub LSB Abdomen: soft, non tender No sig edema   Rate: 80  Rhythm: normal sinus rhythm- 40 beats of PAF last pm  Lab Results:  Recent Labs  07/27/14 1215  WBC 11.4*  HGB 9.3*  PLT 288    Recent Labs  07/27/14 1215  NA 137  K 4.4  CL 93*  CO2 27  GLUCOSE 93  BUN 29*  CREATININE 7.02*    Scheduled Meds: . amiodarone  200 mg Oral BID  . budesonide (PULMICORT) nebulizer solution  0.25 mg Nebulization BID  . darbepoetin (ARANESP) injection - DIALYSIS  150 mcg Intravenous Q Mon-HD  . feeding supplement (NEPRO CARB STEADY)  237 mL Oral BID BM  . feeding supplement (PRO-STAT SUGAR FREE 64)  30 mL Oral TID BM  . feeding supplement (RESOURCE BREEZE)  1 Container Oral Q1500  . ipratropium  0.5 mg Nebulization BID  . levalbuterol  0.63 mg Nebulization BID  . levothyroxine  25 mcg Oral QAC breakfast  . metoprolol tartrate  25 mg Oral BID  . midodrine  10 mg Oral BID WC  . multivitamin  1 tablet Oral QHS  . sodium chloride  10-40 mL Intracatheter Q12H  . vancomycin  125 mg Oral 4 times per day     Imaging: Imaging results have been reviewed  Cardiac  Studies: Echo 4/21 Study Conclusions  - Left ventricle: The cavity size was normal. There was moderate concentric hypertrophy. Systolic function was normal. The estimated ejection fraction was in the range of 60% to 65%. Wall motion was normal; there were no regional wall motion abnormalities. Doppler parameters are consistent with abnormal left ventricular relaxation (grade 1 diastolic dysfunction). There was no evidence of elevated ventricular filling pressure by Doppler parameters. - Ventricular septum: The contour showed diastolic flattening and systolic flattening consistent with right ventricular volume and pressure overload . - Aortic valve: Trileaflet; mildly thickened, mildly calcified leaflets. Transvalvular velocity was within the normal range. There was no stenosis. There was no regurgitation. - Mitral valve: Mildly thickened leaflets . There was no regurgitation. - Right ventricle: The cavity size was severely dilated. Wall thickness was normal. Systolic function was mildly to moderately reduced. - Right atrium: The atrium was severely dilated. - Tricuspid valve: There was moderate-severe regurgitation. - Pulmonic valve: There was no regurgitation. - Pulmonary arteries: The main pulmonary artery was normal-sized. Systolic pressure was severely increased. PA peak pressure: 90 mm Hg (S). - Inferior vena cava: The vessel was normal in size. - Pericardium, extracardiac: There was no pericardial effusion.  Impressions:  - Normal LV systolic function, impaired relaxation with normal filling pressures. Severely dilated right sided chamber and severe pulmonary hypertension consistent with cor pulmonale.  Assessment/Plan:  59 year old female with HTN and CRI admitted 06/17/14 with respiratory failure secondary to flu.  She is now ESRD on HD. She was noted to have weakness on her left side during hospital stay with CT scan demonstrating  acute embolic infarct of right anterior cerebral artery territory. Since then she has had a prolonged hospitalization complicated by perforated pyloric ulcer requiring laparotomy and bowel resection on 06/21/14, pelvic abscess requiring drain,HIT, and PAT- NSR now on Amiodarone. She had an echo 07/23/14 for a new rub that showed markedly elevated Rt heart pressures. VQ was intermediate for PE. She was not placed on Heparin secondary to her history of HIT.    Principal Problem:   Acute respiratory failure with hypoxia Active Problems:   Cerebral thrombosis with cerebral infarction   ESRD needing dialysis   Renal failure (ARF), acute on chronic   Hypertension   H1N1 influenza   PSVT (paroxysmal supraventricular tachycardia)   SOB (shortness of breath)   Perforated gastric ulcer   History of intermediate V/Q scan   HIT (heparin-induced thrombocytopenia)   Obesity (BMI 30-39.9)   Depression   Left renal mass   Chronic diastolic heart failure   Tobacco use disorder   Hyperlipidemia   Abnormal TSH   Fistula   PLAN: Plan was for CTA to r/o PE but there have been problems with IV access and it looks like this is not going to be done. Will discuss with MD but I'm not sure a CTA would make a difference, anticogaulation indicated anyway for PAF and CVA. The question is wether or not she is a candidate secondary to multiple co morbidities.  Abnormal TSH noted- will need to follow this on Amiodarone. Lopressor held yesterday pm secondary to hypotension.  Corine Shelter PA-C Beeper [phone] 07/28/2014, 7:59 AM  7:59 AM   Personally seen and examined. Agree with above. CHADS-VASc is 4 (stroke, HTN, Female). Ultimately deserves anticoagulation (coumadin). Given her multitude of co morbidities (including fistula) she would not be an optimal candidate for anticoagulation. Risks outweigh benefits. Family understands risk of future stroke/ thrombus.   PSVT seems better controlled with amio 200 BID. On  DC, would decrease to 200 QD.   Discussed with Dr. Gwenlyn Perking.   Will sign off for now. Please call if any questions.   Donato Schultz, MD

## 2014-07-29 ENCOUNTER — Inpatient Hospital Stay (HOSPITAL_COMMUNITY): Payer: Medicaid Other

## 2014-07-29 ENCOUNTER — Encounter (HOSPITAL_COMMUNITY): Payer: Self-pay

## 2014-07-29 LAB — CBC WITH DIFFERENTIAL/PLATELET
BASOS PCT: 1 % (ref 0–1)
Basophils Absolute: 0.1 10*3/uL (ref 0.0–0.1)
EOS PCT: 2 % (ref 0–5)
Eosinophils Absolute: 0.2 10*3/uL (ref 0.0–0.7)
HCT: 31.4 % — ABNORMAL LOW (ref 36.0–46.0)
Hemoglobin: 9.7 g/dL — ABNORMAL LOW (ref 12.0–15.0)
LYMPHS ABS: 1.6 10*3/uL (ref 0.7–4.0)
Lymphocytes Relative: 14 % (ref 12–46)
MCH: 28.6 pg (ref 26.0–34.0)
MCHC: 30.9 g/dL (ref 30.0–36.0)
MCV: 92.6 fL (ref 78.0–100.0)
MONO ABS: 1.2 10*3/uL — AB (ref 0.1–1.0)
MONOS PCT: 11 % (ref 3–12)
NEUTROS PCT: 72 % (ref 43–77)
Neutro Abs: 8 10*3/uL — ABNORMAL HIGH (ref 1.7–7.7)
PLATELETS: 342 10*3/uL (ref 150–400)
RBC: 3.39 MIL/uL — ABNORMAL LOW (ref 3.87–5.11)
RDW: 23.4 % — ABNORMAL HIGH (ref 11.5–15.5)
WBC: 11.1 10*3/uL — AB (ref 4.0–10.5)

## 2014-07-29 LAB — COMPREHENSIVE METABOLIC PANEL
ALBUMIN: 1.8 g/dL — AB (ref 3.5–5.2)
ALT: 18 U/L (ref 0–35)
ANION GAP: 15 (ref 5–15)
AST: 41 U/L — ABNORMAL HIGH (ref 0–37)
Alkaline Phosphatase: 137 U/L — ABNORMAL HIGH (ref 39–117)
BUN: 29 mg/dL — AB (ref 6–23)
CALCIUM: 8.4 mg/dL (ref 8.4–10.5)
CO2: 25 mmol/L (ref 19–32)
CREATININE: 6.65 mg/dL — AB (ref 0.50–1.10)
Chloride: 94 mmol/L — ABNORMAL LOW (ref 96–112)
GFR calc non Af Amer: 6 mL/min — ABNORMAL LOW (ref >90)
GFR, EST AFRICAN AMERICAN: 7 mL/min — AB (ref >90)
GLUCOSE: 55 mg/dL — AB (ref 70–99)
Potassium: 4 mmol/L (ref 3.5–5.1)
Sodium: 134 mmol/L — ABNORMAL LOW (ref 135–145)
TOTAL PROTEIN: 6.2 g/dL (ref 6.0–8.3)
Total Bilirubin: 1.2 mg/dL (ref 0.3–1.2)

## 2014-07-29 LAB — GLUCOSE, CAPILLARY
GLUCOSE-CAPILLARY: 85 mg/dL (ref 70–99)
GLUCOSE-CAPILLARY: 55 mg/dL — AB (ref 70–99)
GLUCOSE-CAPILLARY: 73 mg/dL (ref 70–99)
Glucose-Capillary: 62 mg/dL — ABNORMAL LOW (ref 70–99)
Glucose-Capillary: 118 mg/dL — ABNORMAL HIGH (ref 70–99)

## 2014-07-29 LAB — PHOSPHORUS: Phosphorus: 6.6 mg/dL — ABNORMAL HIGH (ref 2.3–4.6)

## 2014-07-29 LAB — MAGNESIUM: Magnesium: 2 mg/dL (ref 1.5–2.5)

## 2014-07-29 LAB — TRIGLYCERIDES: Triglycerides: 178 mg/dL — ABNORMAL HIGH (ref <150)

## 2014-07-29 MED ORDER — LIDOCAINE HCL 1 % IJ SOLN
INTRAMUSCULAR | Status: AC
  Filled 2014-07-29: qty 20

## 2014-07-29 MED ORDER — TRACE MINERALS CR-CU-F-FE-I-MN-MO-SE-ZN IV SOLN
INTRAVENOUS | Status: AC
  Administered 2014-07-29: 18:00:00 via INTRAVENOUS
  Filled 2014-07-29: qty 960

## 2014-07-29 MED ORDER — DEXTROSE 50 % IV SOLN
25.0000 mL | Freq: Once | INTRAVENOUS | Status: AC
  Administered 2014-07-29: 25 mL via INTRAVENOUS
  Filled 2014-07-29: qty 50

## 2014-07-29 MED ORDER — FAT EMULSION 20 % IV EMUL
240.0000 mL | INTRAVENOUS | Status: AC
  Administered 2014-07-29: 240 mL via INTRAVENOUS
  Filled 2014-07-29: qty 250

## 2014-07-29 MED ORDER — MORPHINE SULFATE 2 MG/ML IJ SOLN
INTRAMUSCULAR | Status: AC
  Filled 2014-07-29: qty 1

## 2014-07-29 MED ORDER — IOHEXOL 350 MG/ML SOLN
80.0000 mL | Freq: Once | INTRAVENOUS | Status: AC | PRN
  Administered 2014-07-29: 80 mL via INTRAVENOUS

## 2014-07-29 MED ORDER — INSULIN ASPART 100 UNIT/ML ~~LOC~~ SOLN
0.0000 [IU] | Freq: Four times a day (QID) | SUBCUTANEOUS | Status: DC
  Administered 2014-07-30 (×2): 1 [IU] via SUBCUTANEOUS
  Administered 2014-07-30: 2 [IU] via SUBCUTANEOUS
  Administered 2014-07-31: 1 [IU] via SUBCUTANEOUS
  Administered 2014-07-31 (×2): 2 [IU] via SUBCUTANEOUS
  Administered 2014-08-01: 1 [IU] via SUBCUTANEOUS
  Administered 2014-08-01: 2 [IU] via SUBCUTANEOUS
  Administered 2014-08-01 – 2014-08-02 (×3): 1 [IU] via SUBCUTANEOUS
  Administered 2014-08-02: 2 [IU] via SUBCUTANEOUS
  Administered 2014-08-02: 1 [IU] via SUBCUTANEOUS
  Administered 2014-08-03: 2 [IU] via SUBCUTANEOUS
  Administered 2014-08-03 – 2014-08-04 (×2): 1 [IU] via SUBCUTANEOUS
  Administered 2014-08-04: 2 [IU] via SUBCUTANEOUS
  Administered 2014-08-04 (×2): 1 [IU] via SUBCUTANEOUS
  Administered 2014-08-05: 2 [IU] via SUBCUTANEOUS
  Administered 2014-08-05 – 2014-08-06 (×2): 1 [IU] via SUBCUTANEOUS
  Administered 2014-08-06 (×3): 2 [IU] via SUBCUTANEOUS
  Administered 2014-08-07 (×3): 1 [IU] via SUBCUTANEOUS
  Administered 2014-08-07 – 2014-08-08 (×2): 2 [IU] via SUBCUTANEOUS
  Administered 2014-08-08 – 2014-08-09 (×3): 1 [IU] via SUBCUTANEOUS
  Administered 2014-08-09: 9 [IU] via SUBCUTANEOUS
  Administered 2014-08-10: 1 [IU] via SUBCUTANEOUS
  Administered 2014-08-12: 2 [IU] via SUBCUTANEOUS
  Administered 2014-08-12 – 2014-08-13 (×2): 1 [IU] via SUBCUTANEOUS
  Administered 2014-08-13: 2 [IU] via SUBCUTANEOUS
  Administered 2014-08-13 – 2014-08-14 (×3): 1 [IU] via SUBCUTANEOUS
  Administered 2014-08-14: 2 [IU] via SUBCUTANEOUS
  Administered 2014-08-15 (×3): 1 [IU] via SUBCUTANEOUS
  Administered 2014-08-16 – 2014-08-17 (×4): 2 [IU] via SUBCUTANEOUS
  Administered 2014-08-18 – 2014-08-20 (×9): 1 [IU] via SUBCUTANEOUS
  Administered 2014-08-20: 2 [IU] via SUBCUTANEOUS
  Administered 2014-08-20 – 2014-08-21 (×3): 1 [IU] via SUBCUTANEOUS
  Administered 2014-08-22: 2 [IU] via SUBCUTANEOUS
  Administered 2014-08-22 – 2014-08-23 (×4): 1 [IU] via SUBCUTANEOUS
  Administered 2014-08-24: 0 [IU] via SUBCUTANEOUS
  Administered 2014-08-24: 1 [IU] via SUBCUTANEOUS

## 2014-07-29 MED ORDER — DEXTROSE 50 % IV SOLN
INTRAVENOUS | Status: AC
  Administered 2014-07-29: 25 mL
  Filled 2014-07-29: qty 50

## 2014-07-29 NOTE — Progress Notes (Addendum)
PARENTERAL NUTRITION CONSULT NOTE - Initial  Pharmacy Consult:  TPN Indication:  Duodenal fistula  No Known Allergies   Patient Measurements: Height: _0  (167.6 cm) Weight: 198 lb 10.2 oz (90.1 kg) IBW/kg (Calculated) : 59.3  Adjusted body weight: 67 kg  Vital Signs: Temp: 98.7 F (37.1 C) (04/27 0757) Temp Source: Oral (04/27 0757) BP: 108/72 mmHg (04/27 0757) Pulse Rate: 86 (04/27 0757) Intake/Output from previous day: 04/26 0701 - 04/27 0700 In: 430 [P.O.:430] Out: 810 [Drains:810]  Labs:  Recent Labs  07/27/14 1215  WBC 11.4*  HGB 9.3*  HCT 31.0*  PLT 288     Recent Labs  07/27/14 1215 07/29/14 0415  NA 137 134*  K 4.4 4.0  CL 93* 94*  CO2 27 25  GLUCOSE 93 55*  BUN 29* 29*  CREATININE 7.02* 6.65*  CALCIUM 8.5 8.4  MG  --  2.0  PHOS  --  6.6*  PROT  --  6.2  ALBUMIN  --  1.8*  AST  --  41*  ALT  --  18  ALKPHOS  --  137*  BILITOT  --  1.2  TRIG  --  178*   Estimated Creatinine Clearance: 10.4 mL/min (by C-G formula based on Cr of 6.65).    Recent Labs  07/28/14 1231 07/28/14 1702 07/29/14 0756  GLUCAP 79 81 62*   Insulin Requirements in the past 24 hours:  No insulin required  Assessment: 80 YOF with complicated hospital course. S/p closure perforated pyloric ulcer, SBR for ischemic necrosis, diffuse peritonitis on 07/01/14. Pharmacy consulted to provide TPN 4/1-4/15 for nutrition support for prolonged ileus. Pt then transitioned to po diet + supplements. Pt found to have duodenal fistula 4/26 so restarting TPN.  GI: Duodenal fistula. NPO. PICC line in IR today and TPN to start tonight. Drain o/p 810 ml/24h. Albumin 1.8. Last prealbumin from 4/1 is low at 10. Noted palliative care discussions ongoing but for now, pt/family wish for aggressive care.  Endo: hx DM. CBGs low-nl while pt receiving no nutrition.   Lytes: Will plan for none in TPN since pt on iHD. Na 134, Cl 94, K 4, Phos 6.6, Mg 2, CoCa 10.1 (Ca x Phos product =  67)  Renal: New ESRD - started 3/17. M/W/F. Noted pt continues with volume overload. I/O -0.4L today.  Pulm: asthma, COPD. 6L [place]. Concerns for PE. VQ scan intermediate probability. CT chest pending then will decide on [facility]  Cards: HTN. VSS. Midodrine.  Hepatotobil: Alk phos slightly elevated, other LFTs wnl. TG 178  Neuro: hx depression / insomnia - new stroke noted on 3/18.   ID: Pt continues on oral vanc D#6/14 for cdiff colitis. Diarrhea is better.  Best Practices: SCDs, PPI IV  TPN Access: PICC line in IR to be placed 4/27 (confirmed that pt on way down to IR ~1020)  TPN: 4/1->4/15; restart 4/27>>  Current Nutrition:  NPO  Nutritional Goals: (per RD note 4/26) 2100-2300 kCal, 120-140 grams of protein per day Goal: Clinimix 5/15 at 157m/hr + 20% IVFE 133mhr to provide 2184 kcal and 120 gm protein  Plan:  - Begin Clinimix 5/15 (no electrolytes) at 4064mr and 20% IVFE at 67m54m which will provide 1160 kcal and 48 gm protein. Will advance to goal as tolerated. - Trace elements and MVI daily in TPN. Will d/c po renavite. - Begin sensitive SSI q6h - TPN labs  CaroSherlon HandingarmD, BCPS Clinical pharmacist, pager [phone] 8:52 AM

## 2014-07-29 NOTE — Progress Notes (Signed)
Progress Note [facility].   [name] ZOX:[id number] DOB: 05-13-1955 DOA: 06/17/2014 PCP: [name], MD  Admit HPI / Brief Narrative: 59 yo female smoker with hx HTN, ESRD on HD (just began this admit), COPD, failed L AV fistula, initially admitted 3/16 with flu and acute hypoxic resp failure. Ultimately tx to [facility] due to need for HD. 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   Patient on 3/31 was taking to the OR for ex lap for per pyloric ulcer and sm bowel resection. Patient develops septic shock on 4-1, she was started on pressors and wean off pressors on 4-3. Subsequently she was diagnosed with pelvic abscess by CT scan perform on 4-7. She underwent pelvic drain placement by IR. Patient also develops C. diff diagnosed 4-12.  C diff was not responding flagyl, and she was started on vancomycin 4-16. She also develops  run of SVT. Cardiology has been helping with management. Course complicated with high RV pressure and concerns for PE; also with fistula formation between anastomosis and blake drain, making her back to NPO state and in need of TPN. Family want everything to be done. Palliative care consulted. Currently Full code.  HPI/Subjective: Patient with flat affect. She is still thinking about comfort measure vs aggressive care.  She denies worsening abdominal pain. She is not able to eat ice cream or eat anything. .   Assessment/Plan: C. difficile colitis C diff positive on 4-12.  Received 4 days of flagyl without improvement.  Will count as day 6/14 for oral vanc Diarrhea is  better currently.  Acute hypoxic respiratory failure secondary to H1 N1 influenza +/- HCAP -Has completed antibiotics.  -No accessory muscles use; requiring 6L of oxygen  -will continue use pulmicort and continue using flutter valve/pulmonary hygiene and xopenex -CT angio negative for PE.   PSVT/concerns for pulmonary embolism given extremely high heart right side pressure -Has  been evaluated by EP - TSH is slightly elevated; will check Free T4 and T3 -Cardiology following - follow K+ and Mg  -Continue with metoprolol and amiodarone; cardiology adjusting medications.  -2-D echo to be repeated, with concerns for pericardial rub -Echo demonstrated extremely elevated heart right side pressure and moderately reduce function of RV -VQ scan with intermediate probability for pulmonary embolism.  -CTA negative for PE.   Small PFO Has been evaluated by Cardiology No need for closure currently  ACA CVA with Lt sided weakness Will need ongoing long term rehab - PT/OT to cont to follow while inpatient Patient remains very weak and deconditioned (needs 2++ assistance to get out of bed; currently found to weak to try HD on chair)  ESRD new start HD this admit  Ongoing hemodialysis per Nephrology - perm-cath placed 3/26 d/t AVF problems Electrolytes abnormalities corrected with dialysis.  Patient to have AV fistula reviewed at some point for permanent access (most likely in outpatient setting) .    Status post exploratory laparotomy with closure of perforated pyloric ulcer/abdominal abscess / small bowel resection w/ postop ileus and now with fistula General Surgery continues to follow Due to Fistula is back to NPO state and with plans to pursuit temporary TPN if decision is for aggressive management; vs palliative care and comfort feeding  Pelvic abscess status post percutaneous drain placement 4/8; now with leakage from anastomosis site -Ongoing care per general surgery -Received  Zosyn for 23 days. IV antibiotics discontinue by Surgery.  -Received  18 days of vancomycin and  18 days of fluconazole.  -Body fluid grew lactobacillus species.  -Will follow recommendations from CCS; Patient with leakage from anastomosis site needs to be NPO and back to TPN. -Palliative care consulted, awaiting further conversations.   Chronic diastolic congestive heart failure w/ Severe  RHF EF 60-65%, grade 1 diastolic dysfunction. Appears compensated.  Fluids/volume manage with hemodialysis.  Concerns for PE on 2-D echo; CTA negative for PE>   Anemia of critical illness and chronic disease Hgb presently stable s/p 2U PRBC 4/13 - follow  HB remains stable.  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 TPN wil be started today.  Holding SSI at this moment; will need re-initiation once on TNA again  Abnormal TSH -Free T4 WNL -T3 1.8 -most likely sick thyroid with ongoing infection and body stress; also due to amiodarone use. TSH in 7 range -will continue low dose synthroid given PSVT  Obesity - Inadequate oral intake related to altered GI function as evidenced by limited intake, ongoing.  Body mass index is 32.08 kg/(m^2). -patient weight trending down -patient unable to tolerate diet and with positive leaking fistula  Code Status: FULL Family Communication: discussed with patient.  Disposition Plan: Will need long-term rehabilitation at SNF; once able to tolerate HD on recliner and with improve nutrition; last one very dificult as patient with positive leaking fistula and in need of TPN.    Consultants: Cardiology Nephrology PCCM Gen Surgery  Palliative care  Significant Events: 3/16 influenza A+ 3/18 R ACA stroke 3/19 MRI multifocal acute infarction of both hemispheres, large right distal ACA 3/22 TEE severe RAE, severely reduced RV function, severe TR, no LAA thrombus, small PFO on TEE 3/31 to OR ex lap for per pyloric ulcer and sm bowel resection 4/1 septic shock on pressors 4/3 pressors weaned  off  4/4 restart pressors, CRRT 4/6 levo switched to neo 4/7 pelvic abscess on CT 4/8 IR placed pelvic drain 4/9 off pressors 4/11 long run of SVT. 4/23 VQ scan which demonstrated intermediate probability for PE 4/24 CT angiogram of her chest: Pending/unable to be done due to lack of access   Antibiotics: Zosyn >stopped on 4/22 (received a total of 23 days of treatment ) Vancomycin 4-16  DVT prophylaxis: SCDs  Objective: Blood pressure 108/72, pulse 86, temperature 98.7 F (37.1 C), temperature source Oral, resp. rate 17, height 5\' 6"  (1.676 m), weight 90.1 kg (198 lb 10.2 oz), SpO2 100 %.  Intake/Output Summary (Last 24 hours) at 07/29/14 1326 Last data filed at 07/29/14 0815  Gross per 24 hour  Intake    430 ml  Output    710 ml  Net   -280 ml   Exam: General: NAD, flat affect.  Lungs: scattered rhonchi, mild expiratory wheezes and fair air movement, no frank crackles  Cardiovascular: Regular rate, no murmurs and no gallops   Abdomen: Abdominal wound dressing, clean and dry; abdomen slightly distended, drain intact and with serous/greenigh drainage; patient reports mild pain with palpation;  Extremities: No significant cyanosis, clubbing, edema bilateral lower extremities  Data Reviewed: Basic Metabolic Panel:  Recent Labs Lab 07/24/14 1419 07/27/14 1215 07/29/14 0415  NA 135 137 134*  K 4.3 4.4 4.0  CL 96 93* 94*  CO2 GLUCOSE 100* 93 55*  BUN 25* 29* 29*  CREATININE 5.62* 7.02* 6.65*  CALCIUM 8.2* 8.5 8.4  MG  --   --  2.0  PHOS 6.4*  --  6.6*    Liver Function Tests:  Recent Labs Lab 07/24/14 1419 07/29/14 0415  AST  --  41*  ALT  --  18  ALKPHOS  --  137*  BILITOT  --  1.2  PROT  --  6.2  ALBUMIN 1.7* 1.8*   CBC:  Recent Labs Lab 07/24/14 1418 07/27/14 1215  WBC 9.4 11.4*  HGB 9.1* 9.3*  HCT 29.4* 31.0*  MCV 92.5 93.4  PLT 308 288    CBG:  Recent Labs Lab 07/28/14 1231 07/28/14 1702 07/29/14 0756 07/29/14 0858  07/29/14 1322  GLUCAP 79 81 62* 85 55*    Scheduled Meds:  Scheduled Meds: . amiodarone  200 mg Oral BID  . budesonide (PULMICORT) nebulizer solution  0.25 mg Nebulization BID  . darbepoetin (ARANESP) injection - DIALYSIS  150 mcg Intravenous Q Mon-HD  . [START ON 07/30/2014] insulin aspart  0-9 Units Subcutaneous 4 times per day  . levothyroxine  25 mcg Oral QAC breakfast  . lidocaine      . metoprolol tartrate  25 mg Oral BID  . midodrine  10 mg Oral BID WC  . sodium chloride  10-40 mL Intracatheter Q12H  . vancomycin  125 mg Oral 4 times per day    Time spent on care of this patient: 35 minutes   Alba Cory , MD  [phone] [facility] Office  [phone] Pager - Text Page per Loretha Stapler as per below:  On-Call/Text Page:      [email or web address]      password [email or web address]  If 7PM-7AM, please contact night-coverage [email or web address] Password [email or web address] 07/29/2014, 1:26 PM   LOS: 42 days

## 2014-07-29 NOTE — Consult Note (Signed)
WOC wound consult note Reason for Consult: Consult requested to apply Vac dressing to abd wound. Pt is followed by CCS team for assessment and plan of care. Wound type: Full thickness post-op wound Measurement: 20x7x4cm Wound bed:95% Beefy red interspersed with 5% yellow adipose tissue  Drainage (amount, consistency, odor) Small amt yellow drainage, no odor Periwound: Intact skin surrounding Dressing procedure/placement/frequency: Applied one piece black foam to 125mm cont suction. Pt tolerated with minimal amt discomfort. Plan for bedside nurse to change Q Mon/Wed/Fri Please re-consult if further assistance is needed. Thank-you,  Cammie Mcgeeawn Brennin Durfee MSN, RN, CWOCN, [place]-AP, CNS [phone]

## 2014-07-29 NOTE — Clinical Social Work Placement (Signed)
   CLINICAL SOCIAL WORK PLACEMENT  NOTE  Date:  07/29/2014  Patient Details  Name: [name] MRN: [id number] Date of Birth: [birth year]  Clinical Social Work is seeking post-discharge placement for this patient at the Skilled  Nursing Facility level of care (*CSW will initial, date and re-position this form in  chart as items are completed):  Yes   Patient/family provided with [place] Clinical Social Work Department's list of facilities offering this level of care within the geographic area requested by the patient (or if unable, by the patient's family).  Yes   Patient/family informed of their freedom to choose among providers that offer the needed level of care, that participate in Medicare, Medicaid or managed care program needed by the patient, have an available bed and are willing to accept the patient.      Patient/family informed of 's ownership interest in [facility] and [facility], as well as of the fact that they are under no obligation to receive care at these facilities.  PASRR submitted to EDS on 07/23/14     PASRR number received on 07/23/14     Existing PASRR number confirmed on       FL2 transmitted to all facilities in geographic area requested by pt/family on 07/24/14     FL2 transmitted to all facilities within larger geographic area on       Patient informed that his/her managed care company has contracts with or will negotiate with certain facilities, including the following:            Patient/family informed of bed offers received.  Patient chooses bed at       Physician recommends and patient chooses bed at      Patient to be transferred to   on  .  Patient to be transferred to facility by       Patient family notified on   of transfer.  Name of family member notified:        PHYSICIAN       Additional Comment:    _______________________________________________ Cristobal Goldmannrawford, Grayce Budden Bradley, LCSW 07/29/2014, 4:01  PM

## 2014-07-29 NOTE — Consult Note (Signed)
WOC follow-up: CCS requested assistance with abd JP drain which is filling often and requires frequent emptying.  Connected to low cont wall suction by using an adapter.  Mod amt green drainage in cannister. Please re-consult if further assistance is needed.  Thank-you,  Cammie Mcgeeawn Jyssica Rief MSN, RN, CWOCN, [place]CN-AP, CNS [phone]

## 2014-07-29 NOTE — Progress Notes (Signed)
Hypoglycemic Event  CBG: *55  Treatment: D50 IV 25 mL  Symptoms: None  Follow-up CBG: Time:1358 CBG Result:118  Possible Reasons for Event: Inadequate meal intake  Comments/MD notified: Dr. Eliseo Squiresegalado    [name]

## 2014-07-29 NOTE — Progress Notes (Signed)
Subjective:  No change in abd. Discomfort/not able to sit in recliner HD today/ Noted meeting with family /pt/ admit team /palliative care= "continue Everything"/ need for IV  Nutrition needs for central iv line (with ESRD and [place] avf trouble SHOULD NOT Have PIC in R Arm)  Objective Vital signs in last 24 hours: Filed Vitals:   07/28/14 1703 07/28/14 2134 07/28/14 2140 07/29/14 0523  BP: 107/72 109/81  105/69  Pulse: 93 85 89 83  Temp: 98.4 F (36.9 C) 98 F (36.7 C)  97.8 F (36.6 C)  TempSrc: Oral Oral  Oral  Resp: Height:      Weight:    90.1 kg (198 lb 10.2 oz)  SpO2: 100% 100% 97% 98%   Weight change: -0.9 kg (-1 lb 15.8 oz) Physical Exam: General: sleeping awoken , Alert, NAD,chronically ill Heart:RRR no gallop or mur.  Lungs: Decr BS at Bases otherwise CTA  Abdomen: VAC in place midline, ND, tender to pal. BS pos. Extremities: no pedal edema .  Dialysis Access: left AVF + bruit and right IJ  OP Dialysis Orders: new start, on MWF schedule here.wt uest 89 kg post  Problem/Plan: 1. New ESRD - on MWF schedule in [place] / started HD 3/17,k 4.0 / R IJ catheter used sec. difficulties using AVF; will try to do  HD in  recliner  When pain better/  To go to snh when medically stable will have to be in recliner  Unless going to LTAC 2. Vol excess - cont vol reduction with HD  As bp allows On Midodrine/ will be on IV nutrition so monitor vol closely with her Cardiac issue also PSVT/concerns for pulmonary embolism given extremely high heart right side pressure/-Small PFO on amio- per Cardiology likely PAT vs PJRT- cards noted pericardial rub - Echo =no pericardial effusion/Mod-severe TR, normal LV systolic fx, severe R heart and severe pul HTN c/w cor pulmonale/ need for CT Angio chest to ro PE complicated by no avail iv access currently ("not able to use HD cath Per ct dept") HOWEVER high risk for bleeding given positive HIT during this admission/CTA to be  pursuit once  new Central line places  ?today 4. Dialysis access - AVF @ [facility] placed 01/28/14 by Dr. Darrick Penna, functioned poorly, revision pending as outpatient vs doing revision prior to d/c; using R IJ catheter placed by Dr. Hart [place] 3/26.  5. Perforated pyloric ulcer / necrotic bowel - s/p repair of perforated ulcer & partial ileum resection 3/30, off Zosyn. Per surgery/npo  NOW with last CT="showed 6.6x3.4cm pre-rectal fluid collection/ could not rule out gastric leak because contrast did not fill stomach"Thus TPN to start 6. Recurrent sepsis - with percutaneous drain (placed 4/8) of pelvic fluid; CCS following  7. C diff colitis - on PO Vancomycin./ she denies Diarrhea this am  8. 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 Hypotension /right heart failure - on midodrine 10. Anemia - Hgb 9.1,>9.3 /s/p 2 U PRBCs 4/13, on Aranesp 150 mcg on Mon; Fe 5% on 3/18.- repeat Fe studies next today  11. Sec HPT - Corr Ca 10.2 P 6.6 , iPTH 338 - not on Hectorol - not on binders for now/ fu phospre hd/ use 2.0 ca bath   12. PCM - Alb 1.8, NPO  For tpn to start when new Central line in  / renal vit /  13. H1N1 Influenza with resp failure/- resolved from admit - no coughing or sob  this am  14. Disp - severely deconditioned; LTAC recommended bu" Medicaid will not pay for this -" Palliative Care following  15. 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 DM type 2 17. Copd/ Asthma- on  - pulmicort and continue using flutter valve/pulmonary hygiene and xopenex   [name] Stephanie Vergara, PA-C [facility] [phone] 07/29/2014,7:54 AM  LOS: 42 days   Labs: Basic Metabolic Panel:  Recent Labs Lab 07/24/14 1419 07/27/14 1215 07/29/14 0415  NA 135 137 134*  K 4.3 4.4 4.0  CL 96 93* 94*  CO2 25 27 25   GLUCOSE 100* 93 55*  BUN 25* 29* 29*  CREATININE 5.62* 7.02* 6.65*  CALCIUM 8.2* 8.5  8.4  PHOS 6.4*  --  6.6*   Liver Function Tests:  Recent Labs Lab 07/24/14 1419 07/29/14 0415  AST  --  41*  ALT  --  18  ALKPHOS  --  137*  BILITOT  --  1.2  PROT  --  6.2  ALBUMIN 1.7* 1.8*   CBC:  Recent Labs Lab 07/22/14 0840 07/24/14 1418 07/27/14 1215  WBC 8.6 9.4 11.4*  HGB 9.2* 9.1* 9.3*  HCT 28.4* 29.4* 31.0*  MCV 87.7 92.5 93.4  PLT 264 308 288  CBG:  Recent Labs Lab 07/27/14 1234 07/28/14 0011 07/28/14 0744 07/28/14 1231 07/28/14 1702  GLUCAP 82 102* 94 79 81    Studies/Results: Dg Sinus/fist Tube Chk-non Gi  07/27/2014   CLINICAL DATA:  Perforated duodenal ulcer with surgical repair. Surgical drain in the upper abdomen. Continued prominent drainage.  EXAM: ABSCESS INJECTION  TECHNIQUE: I injected 30 cc of Omnipaque 300 into the drainage catheter under direct fluoroscopic visualization. [name] tolerated the procedure well.  CONTRAST:  30mL OMNIPAQUE IOHEXOL 300 MG/ML  SOLN  FLUOROSCOPY TIME:  Fluoroscopy Time (in minutes and seconds): 0 min 36 seconds  COMPARISON:  CT scan dated 07/24/2014  FINDINGS: There is a 2 cm cavity which appears to lie at the superior aspect of the apex of the duodenal bulb, directly communicating with the drainage catheter. This cavity does communicate with the duodenal bulb with contrast entering the stomach and duodenum. There is no free intraperitoneal extravasation of contrast.  IMPRESSION: Contained cavity adjacent to the apex of the duodenal bulb which communicates with the drainage catheter. No free intraperitoneal leakage.  Critical Value/emergent results were called by telephone at the time of interpretation on 07/27/2014 at 9:17 Am to [facility] HospitalMEGAN DORT, PA , who verbally acknowledged these results.   Electronically Signed   By: Francene BoyersJames  Maxwell M.D.   On: 07/27/2014 10:13   Medications:   . amiodarone  200 mg Oral BID  . budesonide (PULMICORT) nebulizer solution  0.25 mg Nebulization BID  . darbepoetin (ARANESP) injection -  DIALYSIS  150 mcg Intravenous Q Mon-HD  . ipratropium  0.5 mg Nebulization BID  . levalbuterol  0.63 mg Nebulization BID  . levothyroxine  25 mcg Oral QAC breakfast  . metoprolol tartrate  25 mg Oral BID  . midodrine  10 mg Oral BID WC  . multivitamin  1 tablet Oral QHS  . sodium chloride  10-40 mL Intracatheter Q12H  . vancomycin  125 mg Oral 4 times per day

## 2014-07-29 NOTE — Clinical Social Work Note (Addendum)
CSW continuing to monitor patient's progress readiness for discharge. Patient currently on TPN and Palliative Care involved. CSW will continue to follow and assist with d/c to a skilled facility (recommended d/c plan), when patient medically stable. CSW visited patient's room a couple of times today, however patient was asleep.  CSW attempted to reach patient's daughter Isac SarnaGermisha Toste ([phone]) to update her on facility search results. Message left.  CSW received call from Ms. Isac SarnaGermisha Dreese at 6:19 pm in response to my voice mail. She was updated on the bed offer for patient. She is interested in inpatient rehab for patient and feels [facility] a skilled facility. She is agreeable to SNF placement if patient cannot go to inpatient rehab.  Genelle BalVanessa Shareef Eddinger, MSW, LCSW Licensed Clinical Social Worker Clinical Social Work Department [facility] [phone]

## 2014-07-29 NOTE — Progress Notes (Signed)
[facility] Progress Note  28 Days Post-Op  Subjective: Pt is sad and depressed.  No N/V, NPO currently.  Drain with high output.  Blake drain has opened up and was leaking all over the bed.  Drain having to be emptied frequently.  Pt wants to go home, but she hasn't made decisions about hospice/comfort care or full medical treatment.    Objective: Vital signs in last 24 hours: Temp:  [97.8 F (36.6 C)-98.7 F (37.1 C)] 98.7 F (37.1 C) (04/27 0757) Pulse Rate:  [83-93] 86 (04/27 0757) Resp:  [16-17] 17 (04/27 0757) BP: (90-109)/(55-81) 108/72 mmHg (04/27 0757) SpO2:  [97 %-100 %] 99 % (04/27 0757) Weight:  [90.1 kg (198 lb 10.2 oz)] 90.1 kg (198 lb 10.2 oz) (04/27 0523) Last BM Date: 07/27/14  Intake/Output from previous day: 04/26 0701 - 04/27 0700 In: 430 [P.O.:430] Out: 610 [Drains:610] Intake/Output this shift: Total I/O In: -  Out: 75 [Drains:75]  PE: Gen:  Alert, NAD, depressed Card:  RRR, no M/G/R heard Pulm:  CTA, no W/R/R Abd: Soft, mildly tender over wound vac site, ND, +BS, no HSM, wound vac in place (58mL/24hr - minimal serosanguinous drainage in cannister), Blake drain with green serous output 632mL/24hr.   Lab Results:   Recent Labs  07/27/14 1215  WBC 11.4*  HGB 9.3*  HCT 31.0*  PLT 288   BMET  Recent Labs  07/27/14 1215 07/29/14 0415  NA 137 134*  K 4.4 4.0  CL 93* 94*  CO2 27 25  GLUCOSE 93 55*  BUN 29* 29*  CREATININE 7.02* 6.65*  CALCIUM 8.5 8.4   PT/INR No results for input(s): LABPROT, INR in the last 72 hours. CMP     Component Value Date/Time   NA 134* 07/29/2014 0415   NA 139 10/02/2012 1046   K 4.0 07/29/2014 0415   K 4.4 10/02/2012 1046   CL 94* 07/29/2014 0415   CO2 25 07/29/2014 0415   CO2 25 10/02/2012 1046   GLUCOSE 55* 07/29/2014 0415   GLUCOSE 110 10/02/2012 1046   BUN 29* 07/29/2014 0415   BUN 35.2* 10/02/2012 1046   CREATININE 6.65* 07/29/2014 0415   CREATININE 2.5* 10/02/2012 1046   CALCIUM  8.4 07/29/2014 0415   CALCIUM 9.8 10/02/2012 1046   PROT 6.2 07/29/2014 0415   PROT 8.4* 10/02/2012 1046   ALBUMIN 1.8* 07/29/2014 0415   ALBUMIN 3.1* 10/02/2012 1046   AST 41* 07/29/2014 0415   AST 9 10/02/2012 1046   ALT 18 07/29/2014 0415   ALT <6 Repeated and Verified 10/02/2012 1046   ALKPHOS 137* 07/29/2014 0415   ALKPHOS 108 10/02/2012 1046   BILITOT 1.2 07/29/2014 0415   BILITOT 0.21 10/02/2012 1046   GFRNONAA 6* 07/29/2014 0415   GFRAA 7* 07/29/2014 0415   Lipase  No results found for: LIPASE     Studies/Results: Dg Sinus/fist Tube Chk-non Gi  07/27/2014   CLINICAL DATA:  Perforated duodenal ulcer with surgical repair. Surgical drain in the upper abdomen. Continued prominent drainage.  EXAM: ABSCESS INJECTION  TECHNIQUE: I injected 30 cc of Omnipaque 300 into the drainage catheter under direct fluoroscopic visualization. Ms. Seybold tolerated the procedure well.  CONTRAST:  30mL OMNIPAQUE IOHEXOL 300 MG/ML  SOLN  FLUOROSCOPY TIME:  Fluoroscopy Time (in minutes and seconds): 0 min 36 seconds  COMPARISON:  CT scan dated 07/24/2014  FINDINGS: There is a 2 cm cavity which appears to lie at the superior aspect of the apex of the duodenal bulb,  directly communicating with the drainage catheter. This cavity does communicate with the duodenal bulb with contrast entering the stomach and duodenum. There is no free intraperitoneal extravasation of contrast.  IMPRESSION: Contained cavity adjacent to the apex of the duodenal bulb which communicates with the drainage catheter. No free intraperitoneal leakage.  Critical Value/emergent results were called by telephone at the time of interpretation on 07/27/2014 at 9:17 Am to [facility] , who verbally acknowledged these results.   Electronically Signed   By: Francene BoyersJames  Maxwell M.D.   On: 07/27/2014 10:13    Anti-infectives: Anti-infectives    Start     Dose/Rate Route Frequency Ordered Stop   07/18/14 1800  vancomycin (VANCOCIN) 50 mg/mL oral  solution 125 mg     125 mg Oral 4 times per day 07/18/14 1455     07/17/14 1830  vancomycin (VANCOCIN) IVPB 750 mg/150 ml premix     750 mg 150 mL/hr over 60 Minutes Intravenous  Once 07/17/14 1818 07/18/14 0024   07/15/14 1400  metroNIDAZOLE (FLAGYL) tablet 500 mg  Status:  Discontinued     500 mg Oral 3 times per day 07/15/14 1202 07/18/14 1533   07/13/14 2200  piperacillin-tazobactam (ZOSYN) IVPB 2.25 g  Status:  Discontinued     2.25 g 100 mL/hr over 30 Minutes Intravenous 3 times per day 07/13/14 1318 07/24/14 1138   07/13/14 2000  fluconazole (DIFLUCAN) IVPB 200 mg  Status:  Discontinued     200 mg 100 mL/hr over 60 Minutes Intravenous Every 24 hours 07/13/14 1318 07/19/14 1027   07/07/14 1200  vancomycin (VANCOCIN) IVPB 1000 mg/200 mL premix  Status:  Discontinued     1,000 mg 200 mL/hr over 60 Minutes Intravenous Every 24 hours 07/07/14 0937 07/14/14 1712   07/06/14 2000  fluconazole (DIFLUCAN) IVPB 400 mg  Status:  Discontinued     400 mg 100 mL/hr over 120 Minutes Intravenous Every 24 hours 07/06/14 1507 07/13/14 1318   07/06/14 1800  piperacillin-tazobactam (ZOSYN) IVPB 2.25 g  Status:  Discontinued     2.25 g 100 mL/hr over 30 Minutes Intravenous 4 times per day 07/06/14 1505 07/13/14 1318   07/05/14 2000  fluconazole (DIFLUCAN) IVPB 200 mg  Status:  Discontinued     200 mg 100 mL/hr over 60 Minutes Intravenous Every 24 hours 07/05/14 0757 07/06/14 1507   07/05/14 1400  piperacillin-tazobactam (ZOSYN) IVPB 2.25 g  Status:  Discontinued     2.25 g 100 mL/hr over 30 Minutes Intravenous 3 times per day 07/05/14 0749 07/06/14 1505   07/02/14 1800  vancomycin (VANCOCIN) IVPB 1000 mg/200 mL premix  Status:  Discontinued     1,000 mg 200 mL/hr over 60 Minutes Intravenous Every 24 hours 07/01/14 1858 07/05/14 0801   07/01/14 2200  piperacillin-tazobactam (ZOSYN) IVPB 3.375 g  Status:  Discontinued     3.375 g 100 mL/hr over 30 Minutes Intravenous 4 times per day 07/01/14 1858  07/05/14 0749   07/01/14 2000  fluconazole (DIFLUCAN) IVPB 400 mg  Status:  Discontinued     400 mg 100 mL/hr over 120 Minutes Intravenous Every 24 hours 07/01/14 1858 07/05/14 0757   07/01/14 1400  fluconazole (DIFLUCAN) IVPB 200 mg  Status:  Discontinued     200 mg 100 mL/hr over 60 Minutes Intravenous Every 24 hours 07/01/14 1330 07/01/14 1853   07/01/14 1000  piperacillin-tazobactam (ZOSYN) IVPB 2.25 g  Status:  Discontinued     2.25 g 100 mL/hr over 30 Minutes Intravenous Every  8 hours 07/01/14 0952 07/01/14 1853   07/01/14 1000  vancomycin (VANCOCIN) 500 mg in sodium chloride 0.9 % 100 mL IVPB     500 mg 100 mL/hr over 60 Minutes Intravenous  Once 07/01/14 0952 07/01/14 1126   06/30/14 1200  vancomycin (VANCOCIN) IVPB 1000 mg/200 mL premix     1,000 mg 200 mL/hr over 60 Minutes Intravenous  Once 06/30/14 0944 06/30/14 1403   06/30/14 1200  ceFEPIme (MAXIPIME) 2 g in dextrose 5 % 50 mL IVPB     2 g 100 mL/hr over 30 Minutes Intravenous  Once 06/30/14 0944 06/30/14 1425   06/30/14 0100  vancomycin (VANCOCIN) 2,000 mg in sodium chloride 0.9 % 500 mL IVPB     2,000 mg 250 mL/hr over 120 Minutes Intravenous  Once 06/30/14 0048 06/30/14 0525   06/30/14 0100  ceFEPIme (MAXIPIME) 2 g in dextrose 5 % 50 mL IVPB     2 g 100 mL/hr over 30 Minutes Intravenous  Once 06/30/14 0048 06/30/14 0322   06/27/14 0600  cefUROXime (ZINACEF) 1.5 g in dextrose 5 % 50 mL IVPB     1.5 g 100 mL/hr over 30 Minutes Intravenous On call to O.R. 06/26/14 1019 06/27/14 0630   06/19/14 1800  oseltamivir (TAMIFLU) capsule 30 mg     30 mg Oral Every M-W-F (1800) 06/19/14 1003 06/22/14 1841   06/18/14 1600  oseltamivir (TAMIFLU) capsule 30 mg  Status:  Discontinued     30 mg Oral Daily 06/18/14 1538 06/19/14 1003       Assessment/Plan POD #28 s/p Exploratory Laparotomy with graham patch closure of perforated pyloric ulcer, SBR - 07/01/2014 - Ingram Intra-abdominal abscess Now with duodenal fistula -CT 07/24/14  showed 6.6x3.4cm pre-rectal fluid collection. They could not rule out gastric leak because contrast did not fill stomach. Antibiotics from abdominal perspective have been stopped. -IR gluteal perc drain (in pelvis) was pulled out accidentally when she got out of bed. Surgical blake drain has significantly increased output (669mL/24hr), green serous drainage. Wound vac has 45mL/24hr. Fistula study done and shows drain connection to the duodenal bulb. -Bulb suction having to be emptied very frequently, wonder if it would be better to put to foley bag; however, this may not contain the leak/fistula as well as on suction.  Will ask WOC to give a recommendation. -Secondary to above we need to make her NPO with TPN, however, she may need palliative care consult to discuss her wishes. Her leak is controlled at this point with the blake drain. If a comfort care approach is desired then she could continue to eat.  Discussed that if she eats it may make the situation worse.  It's possible that the fistula won't heal and she may not be a good surgical candidate to re-operate.  No surgical interventions at this time. -VAC back on MWF -I&Os and routine drain care -Pt has not yet made a decision between comfort care/hospitce vs NPO/TPN  VTE prophylaxis-SCDs ID--Zosyn stopped on day #23 (07/24/14) C diff + -- Oral vanc New ESRD  PCM- tolerating POs, but appetite is poor Dispo-continue inpatient, palliative care following, but no decisions made    LOS: 42 days    DORT, Henchy Mccauley 07/29/2014, 8:46 AM Pager: [phone]

## 2014-07-29 NOTE — Progress Notes (Signed)
Attempted to see patient this afternoon, but currently down for CT and plans for dialysis after this completed.  I will follow-up tomorrow to continue to build on prior conversations as documented by Dr Debby BudNorins. Please feel free to contact me with any questions or concerns.   Orvis BrillAaron J. Earlene Bjelland D.O. Palliative Medicine Team at [facility]  Pager: [phone] Team Phone: [phone]

## 2014-07-29 NOTE — Progress Notes (Signed)
PT Cancellation Note  Patient Details Name: Oberia C Baggett MRN: [id number] DOB: Sep 19, 1955   Cancelled Treatment:    Reason Eval/Treat Not Completed: Patient at procedure or test/unavailable   Pt was having HD catheter placed earlier when session was attempted;  May be going to HD later today; Discussed with nursing the possibility of using Maximive to lift her OOB to HD chair;   Will follow up later today as time allows;  Otherwise, will follow up for PT tomorrow;   Thank you,  Van ClinesHolly Ygnacio Fecteau, PT  Acute Rehabilitation Services Pager [phone] Office [phone]     Van ClinesGarrigan, Jasiah Elsen [facility] 07/29/2014, 1:08 PM

## 2014-07-29 NOTE — Procedures (Signed)
Successful LT IJ TUNNELED DL POWER PICC TIP SVC/RA NO COMP STABLE READY FOR USE  

## 2014-07-30 DIAGNOSIS — R109 Unspecified abdominal pain: Secondary | ICD-10-CM | POA: Insufficient documentation

## 2014-07-30 DIAGNOSIS — Z515 Encounter for palliative care: Secondary | ICD-10-CM | POA: Insufficient documentation

## 2014-07-30 LAB — CBC
HCT: 35.3 % — ABNORMAL LOW (ref 36.0–46.0)
HEMOGLOBIN: 11.1 g/dL — AB (ref 12.0–15.0)
MCH: 29 pg (ref 26.0–34.0)
MCHC: 31.4 g/dL (ref 30.0–36.0)
MCV: 92.2 fL (ref 78.0–100.0)
PLATELETS: 420 10*3/uL — AB (ref 150–400)
RBC: 3.83 MIL/uL — AB (ref 3.87–5.11)
RDW: 24.3 % — ABNORMAL HIGH (ref 11.5–15.5)
WBC: 11.1 10*3/uL — AB (ref 4.0–10.5)

## 2014-07-30 LAB — GLUCOSE, CAPILLARY
GLUCOSE-CAPILLARY: 113 mg/dL — AB (ref 70–99)
GLUCOSE-CAPILLARY: 123 mg/dL — AB (ref 70–99)
GLUCOSE-CAPILLARY: 135 mg/dL — AB (ref 70–99)
Glucose-Capillary: 162 mg/dL — ABNORMAL HIGH (ref 70–99)
Glucose-Capillary: 133 mg/dL — ABNORMAL HIGH (ref 70–99)

## 2014-07-30 LAB — COMPREHENSIVE METABOLIC PANEL
ALK PHOS: 146 U/L — AB (ref 39–117)
ALT: 20 U/L (ref 0–35)
AST: 36 U/L (ref 0–37)
Albumin: 2.1 g/dL — ABNORMAL LOW (ref 3.5–5.2)
Anion gap: 14 (ref 5–15)
BILIRUBIN TOTAL: 0.8 mg/dL (ref 0.3–1.2)
BUN: 14 mg/dL (ref 6–23)
CO2: 25 mmol/L (ref 19–32)
Calcium: 8.6 mg/dL (ref 8.4–10.5)
Chloride: 99 mmol/L (ref 96–112)
Creatinine, Ser: 5.2 mg/dL — ABNORMAL HIGH (ref 0.50–1.10)
GFR calc Af Amer: 10 mL/min — ABNORMAL LOW (ref >90)
GFR, EST NON AFRICAN AMERICAN: 8 mL/min — AB (ref >90)
Glucose, Bld: 138 mg/dL — ABNORMAL HIGH (ref 70–99)
Potassium: 3.3 mmol/L — ABNORMAL LOW (ref 3.5–5.1)
Sodium: 138 mmol/L (ref 135–145)
Total Protein: 7.7 g/dL (ref 6.0–8.3)

## 2014-07-30 LAB — PREALBUMIN: Prealbumin: 9 mg/dL — ABNORMAL LOW (ref 17–34)

## 2014-07-30 LAB — MAGNESIUM: MAGNESIUM: 2 mg/dL (ref 1.5–2.5)

## 2014-07-30 LAB — PHOSPHORUS: PHOSPHORUS: 3.6 mg/dL (ref 2.3–4.6)

## 2014-07-30 MED ORDER — FAT EMULSION 20 % IV EMUL
240.0000 mL | INTRAVENOUS | Status: AC
  Administered 2014-07-30: 240 mL via INTRAVENOUS
  Filled 2014-07-30: qty 250

## 2014-07-30 MED ORDER — POTASSIUM CHLORIDE 10 MEQ/50ML IV SOLN
10.0000 meq | INTRAVENOUS | Status: AC
  Administered 2014-07-30 (×3): 10 meq via INTRAVENOUS
  Filled 2014-07-30 (×3): qty 50

## 2014-07-30 MED ORDER — HYDROMORPHONE HCL 1 MG/ML IJ SOLN
0.2000 mg | INTRAMUSCULAR | Status: DC | PRN
  Administered 2014-08-01 – 2014-08-02 (×2): 0.2 mg via INTRAVENOUS
  Filled 2014-07-30 (×2): qty 1

## 2014-07-30 MED ORDER — TRACE MINERALS CR-CU-F-FE-I-MN-MO-SE-ZN IV SOLN
INTRAVENOUS | Status: AC
  Administered 2014-07-30: 17:00:00 via INTRAVENOUS
  Filled 2014-07-30: qty 1680

## 2014-07-30 NOTE — Progress Notes (Signed)
PARENTERAL NUTRITION CONSULT NOTE - Follow-up  Pharmacy Consult:  TPN Indication:  Duodenal fistula  No Known Allergies   Patient Measurements: Height: 5\' 6"  (167.6 cm) Weight: 187 lb 13.3 oz (85.2 kg) IBW/kg (Calculated) : 59.3  Adjusted body weight: 67 kg  Vital Signs: Temp: 98.4 F (36.9 C) (04/28 0612) Temp Source: Oral (04/28 0612) BP: 64/46 mmHg (04/28 0612) Pulse Rate: 113 (04/28 0612) Intake/Output from previous day: 04/27 0701 - 04/28 0700 In: 1440 [P.O.:150; TPN:590] Out: 2395 [Drains:495]  Labs:  Recent Labs  07/27/14 1215 07/29/14 2047 07/30/14 0505  WBC 11.4* 11.1* 11.1*  HGB 9.3* 9.7* 11.1*  HCT 31.0* 31.4* 35.3*  PLT 288 342 420*     Recent Labs  07/27/14 1215 07/29/14 0415 07/30/14 0505  NA 137 134* 138  K 4.4 4.0 3.3*  CL 93* 94* 99  CO2 27 25 25   GLUCOSE 93 55* 138*  BUN 29* 29* 14  CREATININE 7.02* 6.65* 5.20*  CALCIUM 8.5 8.4 8.6  MG  --  2.0 2.0  PHOS  --  6.6* 3.6  PROT  --  6.2 7.7  ALBUMIN  --  1.8* 2.1*  AST  --  41* 36  ALT  --  18 20  ALKPHOS  --  137* 146*  BILITOT  --  1.2 0.8  PREALBUMIN  --  9*  --   TRIG  --  178*  --    Estimated Creatinine Clearance: 13 mL/min (by C-G formula based on Cr of 5.2).    Recent Labs  07/29/14 1648 07/30/14 0044 07/30/14 0610  GLUCAP 73 113* 162*   Insulin Requirements in the past 24 hours:  2 units Novolog SSI  Assessment: 44 YOF with complicated hospital course. S/p closure perforated pyloric ulcer, SBR for ischemic necrosis, diffuse peritonitis on 07/01/14. Pharmacy consulted to provide TPN 4/1-4/15 for nutrition support for prolonged ileus. Pt then transitioned to po diet + supplements. 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  GI: Duodenal fistula. NPO. On TPN. Drain o/p 495 ml/24h. Albumin 2.1. Prealbumin remains low at 9. Noted palliative care discussions ongoing but for now pt/family wish for aggressive care.  Endo: h/o DM. CBGs well  controlled thus far on TPN and minimal SSI, had low CBGs prior to TPN start.   Lytes: Will plan for none in TPN since pt on iHD. K 3.3 (goal >/=4 with afib), Phos 3.6, Mg 2 (goal >/= 2 with afib), CoCa 10.1 (Ca x Phos product = 36)  Renal: New ESRD - started 3/17. HD on M/W/F. Tolerated HD 4/27 BFR 300 x 4h. Noted pt continues with volume overload. I/O -1L today.  Pulm: asthma, COPD. 4L [place]. CT negative for PE.  Cards: HTN. BP quite variable. HR 86-135 (afib). Cards following. Midodrine, metoprolol, amio. CHADS-VASc =4 (not good candidate for [facility]).  Hepatotobil: Alk phos elevated, other LFTs wnl. TG 178  Neuro: hx depression / insomnia - new stroke noted on 3/18.   ID: Pt continues on oral vanc D#7/14 for cdiff colitis. Diarrhea is better.  Best Practices: SCDs, PPI IV  TPN Access: PICC line in IR 4/27  TPN start date: 4/1->4/15; restart 4/27>>  Current Nutrition:  NPO Clinimix 5/15 (no electrolytes) at 48ml/hr and 20% IVFE at 60ml/hr which will provide 1160 kcal and 48 gm protein.  Nutritional Goals: (per RD note 4/26) 2100-2300 kCal, 120-140 grams of protein per day Goal: Clinimix 5/15 at 146ml/hr + 20% IVFE 47ml/hr to provide  2184 kcal and 120 gm protein  Plan:  - Increase Clinimix 5/15 (no electrolytes) to 16ml/hr and 20% IVFE at 26ml/hr which will provide 1762 kcal and 84 gm protein. Will advance to goal as tolerated. - Trace elements and MVI daily in TPN.  - KCl 56mEq/50ml x 3 - Continue sensitive SSI q6h - BMET in a.m., TPN labs per protocol  Sherlon Handing, PharmD, BCPS Clinical pharmacist, pager [phone] 07/30/2014 7:56 AM

## 2014-07-30 NOTE — Progress Notes (Signed)
NUTRITION FOLLOW-UP   DOCUMENTATION CODES Per approved criteria  -Obesity Unspecified   INTERVENTION: TPN per pharmacy.  RD to continue to monitor.   NUTRITION DIAGNOSIS: Inadequate oral intake related to altered GI function as evidenced by limited intake, ongoing.   Goal: Pt to meet >/= 90% of their estimated nutrition needs, progressing  Monitor:  TPN, weight trends, labs, I/O's  ASSESSMENT: Pt with CKD and hypertension presents with of shortness of breath and generalized weakness x 2 weeks. Pt found to have metabolic acidosis from worsening uremia, H1N1 flu. Pt started on HD. 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  Pt s/p exp lab, evacuation of ascites, closure of ulcer, 14 in resection of ileum due to perforated pyloric ulcer with diffuse peritonitis, ischemic necrosis of mid ileum. Pt extubated 4/2.  Pt previously on CRRT transitioned to IHD 4/12. TPN d/c'ed 4/15.  TPN started 4/27 due to duodenal fistula.Per Pharmacy note, Clinimix 5/15 (no electrolytes) currently infusing at 5640ml/hr and 20% IVFE at 7010ml/hr which will provide 1160 kcal and 48 gm protein. Plans for Increase Clinimix 5/15 (no electrolytes) to 7270ml/hr and 20% IVFE at 6610ml/hr which will provide 1762 kcal and 84 gm protein. During time of visit, pt reports having abdominal pains/soreness. Pt remains NPO except for ice chips.   RD to continue to monitor.  Labs: Low potassium and GFR. High creatinine and alkaline phosphatase.  Height: Ht Readings from Last 1 Encounters:  07/22/14 5\' 6"  (1.676 m)    Weight: Wt Readings from Last 1 Encounters:  07/30/14 187 lb 13.3 oz (85.2 kg)  Usual weight: 220 lb (100 kg) *Weight trending down*  BMI:  Body mass index is 30.33 kg/(m^2). Class I obesity  Re-Estimated Nutritional Needs: Kcal: 2100-2300 Protein: 120-140 grams Fluid: Per MD  Skin: +2 generalized edema, incision on abdomen, stage II  pressure ulcer on sacrum  Diet Order: Diet NPO time specified Except for: Ice Chips TPN (CLINIMIX) Adult without lytes TPN (CLINIMIX) Adult without lytes   Intake/Output Summary (Last 24 hours) at 07/30/14 1249 Last data filed at 07/30/14 1100  Gross per 24 hour  Intake   1490 ml  Output   2322 ml  Net   -832 ml    Last BM: 4/25  Labs:   Recent Labs Lab 07/24/14 1419 07/27/14 1215 07/29/14 0415 07/30/14 0505  NA 135 137 134* 138  K 4.3 4.4 4.0 3.3*  CL 96 93* 94* 99  CO2 25 27 25 25   BUN 25* 29* 29* 14  CREATININE 5.62* 7.02* 6.65* 5.20*  CALCIUM 8.2* 8.5 8.4 8.6  MG  --   --  2.0 2.0  PHOS 6.4*  --  6.6* 3.6  GLUCOSE 100* 93 55* 138*    CBG (last 3)   Recent Labs  07/30/14 0610 07/30/14 0733 07/30/14 1231  GLUCAP 162* 135* 133*    Scheduled Meds: . amiodarone  200 mg Oral BID  . budesonide (PULMICORT) nebulizer solution  0.25 mg Nebulization BID  . darbepoetin (ARANESP) injection - DIALYSIS  150 mcg Intravenous Q Mon-HD  . insulin aspart  0-9 Units Subcutaneous 4 times per day  . levothyroxine  25 mcg Oral QAC breakfast  . metoprolol tartrate  25 mg Oral BID  . midodrine  10 mg Oral BID WC  . sodium chloride  10-40 mL Intracatheter Q12H  . vancomycin  125 mg Oral 4 times per day  Continuous Infusions: . TPN (CLINIMIX) Adult without lytes 40 mL/hr at 07/29/14 1812   And  . fat emulsion 240 mL (07/29/14 1812)  . TPN (CLINIMIX) Adult without lytes     And  . fat emulsion     Marijean Niemann, MS, RD, LDN Pager # [phone] After hours/ weekend pager # [phone]

## 2014-07-30 NOTE — Progress Notes (Signed)
UR COMPLETED  

## 2014-07-30 NOTE — Progress Notes (Signed)
PT Cancellation Note  Patient Details Name: [name] MRN: [id number] DOB: 1955/12/02   Cancelled Treatment:    Reason Eval/Treat Not Completed: Fatigue/lethargy limiting ability to participate.  Patient declines PT today reporting "I feel real bad".  Will return tomorrow for PT session   [name], [name] H 07/30/2014, 3:07 PM Durenda HurtSusan H. Renaldo Fiddleravis, PT, [facility] Acute Rehab Services Pager [phone]

## 2014-07-30 NOTE — Progress Notes (Signed)
Subjective:  Hd yest in Bed sec to abd discomfort/ TPN started with Central chest line per IR yester.  Objective Vital signs in last 24 hours: Filed Vitals:   07/30/14 0042 07/30/14 0500 07/30/14 0612 07/30/14 0916  BP: 88/74  64/46   Pulse: 135  113   Temp: 97.4 F (36.3 C)  98.4 F (36.9 C)   TempSrc: Oral  Oral   Resp: 18  16   Height:      Weight:  85.2 kg (187 lb 13.3 oz)    SpO2: 88%  87% 93%   Weight change: -3 kg (-6 lb 9.8 oz) Physical Exam: General: Alert, NAD,chronically ill Heart : Tachy reg no gallop or mur. Or rub apprecaited Lungs: Decr BS at Bases otherwise CTA  Abdomen: VAC in place midline, drain   present  ND, tender to pal. Extremities: no pedal edema .  Dialysis Access: left AVF + bruit and right IJ  OP Dialysis Orders: new start, on MWF schedule here.wt uest 89 kg post  Problem/Plan: 1. New [facility]D - [facility] / started HD 3/17,k 4.0 / R IJ catheter used sec. difficulties using AVF;When Abd  pain bettertry to do HD in recliner / To go to snh when medically stable will have to be in recliner Unless going to LTAC 2. Vol excess - cont vol reduction with HD As bp allows On Midodrine/ will be on IV nutrition so monitor vol closely with her Cardiac issue also   3    PSVT/concerns for pulmonary embolism given extremely high heart right side pressure/-Small PFO-             Yesterday ct chest = no PE, No Pulmonary Edema,Emphysema,CM no acute findings/ on amio- per          Cardiology likely PAT vs PJRT - Echo =no pericardial effusion/Mod-severe TR, normal LV systolic fx, severe R heart and severe pul HTN . 4. Dialysis access - AVF @ [facility] placed 01/28/14 by Dr. Darrick PennaFields, functioned poorly, revision pending as outpatient vs doing revision prior to d/c; using R IJ catheter placed by Dr. Hart RochesterLawson 3/26.  5. Perforated pyloric ulcer / necrotic bowel - s/p repair of perforated ulcer & partial ileum resection 3/30, off Zosyn. On  PO Vanco / Per  surgery/npo NOW with last CT="showed 6.6x3.4cm pre-rectal fluid collection/ could not rule out gastric leak because contrast did not fill stomach"Thus TPN to started 6. Recurrent sepsis - with percutaneous drain (placed 4/8) of pelvic fluid; [facility] following  7. C diff colitis - on PO Vancomycin./ she denies Diarrhea this am  8. 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 Hypotension /right heart failure - on midodrine 10. Anemia - Hgb 9.1,>9.3> 11.1 /s/p 2 U PRBCs 4/13, on Aranesp 150 mcg on Mon; Fe 5% on 3/18.- FU FE studies  11. Sec HPT - Corr Ca 8.6 correct=10.1/ P 3.6 , iPTH 338 - not on Hectorol - not on binders for now/ / using  2.0 ca bath  12. PCM - Alb 1.8. 2.1 , NPONow on  tpn / renal vit /  13. H1N1 Influenza with resp failure/- resolved from admit - no coughing or sob this am  14. Disp - severely deconditioned; LTAC recommended bu" Medicaid will not pay for this -" Palliative Care following  15. 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 DM type  2 17. Copd/ Asthma- on - pulmicort and continue using flutter valve/pulmonary hygiene and xopenex  Lenny Pastel, PA-C [facility] Beeper [phone] 07/30/2014,9:26 AM  LOS: 43 days   Labs: Basic Metabolic Panel:  Recent Labs Lab 07/24/14 1419 07/27/14 1215 07/29/14 0415 07/30/14 0505  NA 135 137 134* 138  K 4.3 4.4 4.0 3.3*  CL 96 93* 94* 99  CO2 GLUCOSE 100* 93 55* 138*  BUN 25* 29* 29* 14  CREATININE 5.62* 7.02* 6.65* 5.20*  CALCIUM 8.2* 8.5 8.4 8.6  PHOS 6.4*  --  6.6* 3.6   Liver Function Tests:  Recent Labs Lab 07/24/14 1419 07/29/14 0415 07/30/14 0505  AST  --  41* 36  ALT  --  18 20  ALKPHOS  --  137* 146*  BILITOT  --  1.2 0.8  PROT  --  6.2 7.7  ALBUMIN 1.7* 1.8* 2.1*   No results for input(s): LIPASE, AMYLASE in the last 168 hours. No results for input(s): AMMONIA in the last 168  hours. CBC:  Recent Labs Lab 07/24/14 1418 07/27/14 1215 07/29/14 2047 07/30/14 0505  WBC 9.4 11.4* 11.1* 11.1*  NEUTROABS  --   --  8.0*  --   HGB 9.1* 9.3* 9.7* 11.1*  HCT 29.4* 31.0* 31.4* 35.3*  MCV 92.5 93.4 92.6 92.2  PLT 308 288 342 420*   Cardiac Enzymes: No results for input(s): CKTOTAL, CKMB, CKMBINDEX, TROPONINI in the last 168 hours. CBG:  Recent Labs Lab 07/29/14 1358 07/29/14 1648 07/30/14 0044 07/30/14 0610 07/30/14 0733  GLUCAP 118* 73 113* 162* 135*    Studies/Results: Ct Angio Chest Pe W/cm &/or Wo Cm  07/29/2014   CLINICAL DATA:  Shortness of breath. Patients brief history is as follows: "smoker with hx HTN, ESRD on HD (just began this admit), COPD, failed L AV fistula, initially admitted 3/16 with flu and acute hypoxic resp failure. Ultimately tx to [facility] due to need for HD. 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 Patient develops septic shock on 4-1, she was started on pressors and wean off pressors on 4-3. Subsequently she was diagnosed with pelvic abscess by CT scan perform on 4-7. She underwent pelvic drain placement by IR. Patient also develops C. diff diagnosed 4-12. C diff was not responding flagyl, and she was started on vancomycin 4-16. She also develops run of SVT. Cardiology has been helping with management. Course complicated with high RV pressure and concerns for PE; also with fistula formation between anastomosis and blake drain, making her back to NPO state and in need of TPN.  EXAM: CT ANGIOGRAPHY CHEST WITH CONTRAST  TECHNIQUE: Multidetector CT imaging of the chest was performed using the standard protocol during bolus administration of intravenous contrast. Multiplanar CT image reconstructions and MIPs were obtained to evaluate the vascular anatomy.  CONTRAST:  80mL OMNIPAQUE IOHEXOL 350 MG/ML SOLN  COMPARISON:  Chest radiograph,  07/14/2014  FINDINGS: Angiographic study: No evidence of a pulmonary embolus. Aorta is normal in caliber. No dissection.  Thoracic inlet:  No masses or adenopathy.  Mediastinum and hila: Heart is mildly enlarged due to right heart enlargement. No mediastinal or hilar masses or pathologically enlarged lymph nodes.  Lungs and pleura: No lung consolidation or edema. Mild emphysema. There are minor areas of peripheral reticular scarring and minor dependent subsegmental atelectasis.  No pleural effusion or pneumothorax.  Limited upper abdomen: Partly image catheter lies above the proximal stomach. No acute findings in the upper abdomen.  Musculoskeletal:  No osteoblastic or osteolytic lesions.  Review of the MIP images confirms the above findings.  IMPRESSION: 1. No evidence of a pulmonary embolus. 2. No acute findings. 3. Cardiomegaly. 4. Mild emphysema.  No evidence of pneumonia or pulmonary edema.   Electronically Signed   By: Amie Portland M.D.   On: 07/29/2014 15:53   Ir Fluoro Guide Cv Line Left  07/29/2014   CLINICAL DATA:  End-stage renal disease, access for TPN  EXAM: LEFT IJ TUNNELED DOUBLE-LUMEN POWER PICC LINE PLACEMENT WITH ULTRASOUND AND FLUOROSCOPIC GUIDANCE  FLUOROSCOPY TIME:  3 minutes 24 seconds  PROCEDURE: The patient was advised of the possible risks andcomplications and agreed to undergo the procedure. The patient was then brought to the angiographic suite for the procedure.  The LEFT NECKwas prepped with chlorhexidine, drapedin the usual sterile fashion using maximum barrier technique (cap and mask, sterile gown, sterile gloves, large sterile sheet, hand hygiene and cutaneous antisepsis) and infiltrated locally with 1% Lidocaine.  Ultrasound demonstrated patency of the left internal jugular vein, and this was documented with an image. Under real-time ultrasound guidance, this vein was accessed with a 21 gauge micropuncture needle and image documentation was performed. A 0.018 wire was introduced  in to the vein. Under sterile conditions and local anesthesia, a subcutaneous tunnel was created in the left infraclavicular chest. PICC line was tunneled subcutaneously to the venotomy site. Through a peel-away sheath, a cuffed 5 [place] double lumen power PICC was advanced to the lower SVC/right atrial junction. Fluoroscopy during the procedure and fluoro spot radiograph confirms appropriate catheter position. The catheter was flushed and covered with asterile dressing.  Catheter length: 26  Complications: None immediate  IMPRESSION: Successful left internal jugulartunneled double-lumen Power PICC line placement with ultrasound and fluoroscopic guidance. The catheter is ready for use.   Electronically Signed   By: Judie Petit.  Shick M.D.   On: 07/29/2014 13:03   Ir US Guide Vasc Access Left  07/29/2014   CLINICAL DATA:  End-stage renal disease, access for TPN  EXAM: LEFT IJ TUNNELED DOUBLE-LUMEN POWER PICC LINE PLACEMENT WITH ULTRASOUND AND FLUOROSCOPIC GUIDANCE  FLUOROSCOPY TIME:  3 minutes 24 seconds  PROCEDURE: The patient was advised of the possible risks andcomplications and agreed to undergo the procedure. The patient was then brought to the angiographic suite for the procedure.  The LEFT NECKwas prepped with chlorhexidine, drapedin the usual sterile fashion using maximum barrier technique (cap and mask, sterile gown, sterile gloves, large sterile sheet, hand hygiene and cutaneous antisepsis) and infiltrated locally with 1% Lidocaine.  Ultrasound demonstrated patency of the left internal jugular vein, and this was documented with an image. Under real-time ultrasound guidance, this vein was accessed with a 21 gauge micropuncture needle and image documentation was performed. A 0.018 wire was introduced in to the vein. Under sterile conditions and local anesthesia, a subcutaneous tunnel was created in the left infraclavicular chest. PICC line was tunneled subcutaneously to the venotomy site. Through a peel-away  sheath, a cuffed 5 [place] double lumen power PICC was advanced to the lower SVC/right atrial junction. Fluoroscopy during the procedure and fluoro spot radiograph confirms appropriate catheter position. The catheter was flushed and covered with asterile dressing.  Catheter length: 26  Complications: None immediate  IMPRESSION: Successful left internal jugulartunneled double-lumen Power PICC line placement with ultrasound and fluoroscopic guidance. The catheter  is ready for use.   Electronically Signed   By: Judie Petit.  Shick M.D.   On: 07/29/2014 13:03   Medications: . TPN (CLINIMIX) Adult without lytes 40 mL/hr at 07/29/14 1812   And  . fat emulsion 240 mL (07/29/14 1812)  . TPN (CLINIMIX) Adult without lytes     And  . fat emulsion     . amiodarone  200 mg Oral BID  . budesonide (PULMICORT) nebulizer solution  0.25 mg Nebulization BID  . darbepoetin (ARANESP) injection - DIALYSIS  150 mcg Intravenous Q Mon-HD  . insulin aspart  0-9 Units Subcutaneous 4 times per day  . levothyroxine  25 mcg Oral QAC breakfast  . metoprolol tartrate  25 mg Oral BID  . midodrine  10 mg Oral BID WC  . potassium chloride  10 mEq Intravenous Q1 Hr x 3  . sodium chloride  10-40 mL Intracatheter Q12H  . vancomycin  125 mg Oral 4 times per day

## 2014-07-30 NOTE — Progress Notes (Signed)
Chaplain received consult from Palliative Care.   Chaplain spoke with pt briefly, she was resting and asked to return another time.  Spiritual Care team notified.   Will follow-up.   Gala RomneyBrown, Shanley Furlough J, Chaplain 07/30/2014

## 2014-07-30 NOTE — Progress Notes (Signed)
Received a call from CCMD that pt has been continuously tachycardic at 137-142/min. Parameters has been set to 120 and thereby increased to 140. Due beta-blocker given. Pt not in distress. Will continue to monitor.

## 2014-07-30 NOTE — Progress Notes (Signed)
Patient ZO:[id number]:Keierra C Mcallister      DOB: 09-15-1955      EAV:[id number]   [facility]    Subjective: Having some mild abdominal pain today epigastric and RUQ. Wishes she could eat/drink.  Has some trouble sleeping in hospital. No other acute complaints.    Filed Vitals:   07/30/14 1419  BP: 106/72  Pulse: 100  Temp:   Resp: 17   Physical exam: GEN: alert, NAD HEENT: [place], sclera anicteric CV: mild tachy LUNGS: CTAB ABD: soft, mild epigastric tenderness    Assessment and plan: [age] yo female with multiple medical problems including ESRD on HD, COPD, C diff, SVT, perforated ulcer, ischemic ileum, enteric fistula requiring TPN, pelvic abscess. Palliative consulted for GOC.   1. Code Status- Full Code, see discussion from Dr Debby BudNorins  2. GOC- Difficult situation. She has been hospitalized for 43 days now and her problem list alone is worrisome prognostically.  She is now POD #29 from perforated ulcer, bowel resection for ischemia with ongoing enteric fistula.  Family has desired continued aggressive care in face of ultimately poor prognosis.  I spoke with Lizette today at length.  She comments on how things are in "god's hands" currently and how that she hopes to improve with prayer and hopes for a miracle.  I think that hope for a miracle is some acknowledgement that she is very sick.  I am not sure she has great insight into just how sick she is though.  She also takes attitude of not talking/thinking about negative things in fear that this will come true. This of course poses challenges to discussing her goals of care and how to handle her future going forward. I have asked spiritual care to see her as clearly this is very important to her and hopefully they can assist her in coping with her illness.  I spoke with her daughter today as well who reiterates Megha having hope for a miracle.  I mainly tried to build rapport and trust with them today. Hopefully  this will allow a broader discussion as she allows. Clearly this is very difficult for them to talk about.  I will follow-up again with them tomorrow.    3. Symptom Management  1. Abdominal Pain- Likely related to perforated ulcer. I will d/c her IV morphine and put her on similar dose of dilaudid.  Should try to avoid morphine with her ESRD.  Hydrocodone fine to use. Always need to be somewhat cautious with opioids in ESRD  4. Psychosocial/Spiritual.  Has 2 children. Lived at home with her daughter Derrick RavelGermeisha.  Son also involved in her care.  Enjoys watching baseball. Spirituality very important to her.   Total Time: 30 minutes >50% of time spent in counseling and coordination of care regarding above.    Orvis BrillAaron J. Liller Yohn D.O. Palliative Medicine Team at [facility]  Pager: [phone] Team Phone: [phone]

## 2014-07-30 NOTE — Progress Notes (Signed)
Pt came back to the unit per bed from HD accompanied by HD RN Eddie CandleAngelo. Not in any respiratory distress, O2 therapy at 2Lpm as ordered readministered. TPN and lipids infusing well. JP drain hooked back to low continuous suction. Pt made comfortable on bed, vital signs taken by NT. Will administer due meds. Will continue to monitor.

## 2014-07-30 NOTE — Progress Notes (Signed)
Progress Note [facility].   [name] ZOX:[id number] DOB: May 27, 1955 DOA: 06/17/2014 PCP: [name], MD  Admit HPI / Brief Narrative: 59 yo female smoker with hx HTN, ESRD on HD (just began this admit), COPD, failed L AV fistula, initially admitted 3/16 with flu and acute hypoxic resp failure. Ultimately tx to [facility] due to need for HD. 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   Patient on 3/31 was taking to the OR for ex lap for per pyloric ulcer and sm bowel resection. Patient develops septic shock on 4-1, she was started on pressors and wean off pressors on 4-3. Subsequently she was diagnosed with pelvic abscess by CT scan perform on 4-7. She underwent pelvic drain placement by IR. Patient also develops C. diff diagnosed 4-12.  C diff was not responding flagyl, and she was started on vancomycin 4-16. She also develops  run of SVT. Cardiology has been helping with management. Course complicated with high RV pressure and concerns for PE; also with fistula formation between anastomosis and blake drain, making her back to NPO state and in need of TPN. Family want everything to be done. Palliative care consulted. Currently Full code.  HPI/Subjective: She is feeling better today, only relates mild abdominal soreness.  Diarrhea almost resolved. She is still thinking about goals of care.   Assessment/Plan: C. difficile colitis C diff positive on 4-12.  Received 4 days of flagyl without improvement.  Will count as day 7/14 for oral vanc Diarrhea is  better currently.  Acute hypoxic respiratory failure secondary to H1 N1 influenza +/- HCAP -Has completed antibiotics.  -No accessory muscles use; requiring 2 L of oxygen  -will continue use pulmicort and continue using flutter valve/pulmonary hygiene and xopenex -CT angio negative for PE.   PSVT/concerns for pulmonary embolism given extremely high heart right side pressure -Has been evaluated by EP - TSH is slightly  elevated; will check Free T4 and T3 -Cardiology following - follow K+ and Mg  -Continue with metoprolol and amiodarone; cardiology adjusting medications.  -2-D echo to be repeated, with concerns for pericardial rub -Echo demonstrated extremely elevated heart right side pressure and moderately reduce function of RV -V-Q scan with intermediate probability for pulmonary embolism.  -CTA negative for PE.   Small PFO Has been evaluated by Cardiology No need for closure currently  ACA CVA with Lt sided weakness Will need ongoing long term rehab - PT/OT to cont to follow while inpatient Patient remains very weak and deconditioned (needs 2++ assistance to get out of bed; currently found to weak to try HD on chair)  ESRD new start HD this admit  Ongoing hemodialysis per Nephrology - perm-cath placed 3/26 d/t AVF problems Electrolytes abnormalities corrected with dialysis.  Patient to have AV fistula reviewed at some point for permanent access (most likely in outpatient setting) .  Patient will need to be able to sit up for dialysis.   Status post exploratory laparotomy with closure of perforated pyloric ulcer/abdominal abscess / small bowel resection w/ postop ileus and now with fistula General Surgery continues to follow Due to Fistula is back to NPO state and with plans to pursuit temporary TPN if decision is for aggressive management; vs palliative care and comfort feeding  Pelvic abscess status post percutaneous drain placement 4/8; now with leakage from anastomosis site -Ongoing care per general surgery -Received  Zosyn for 23 days. IV antibiotics discontinue by Surgery.  -Received  18 days of  vancomycin and  18 days of fluconazole.  -Body fluid grew lactobacillus species.  -Will follow recommendations from [facility]; Patient with leakage from anastomosis site needs to be NPO and back to TPN. -Palliative care consulted, awaiting further conversations.   Chronic diastolic congestive heart  failure w/ Severe RHF EF 60-65%, grade 1 diastolic dysfunction. Appears compensated.  Fluids/volume manage with hemodialysis.  Concerns for PE on 2-D echo; CTA negative for PE>   Anemia of critical illness and chronic disease Hgb presently stable s/p 2U PRBC 4/13 - follow  HB remains stable.  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 TPN wil be started today.  Holding SSI at this moment; will need re-initiation once on TNA again  Abnormal TSH -Free T4 WNL -T3 1.8 -most likely sick thyroid with ongoing infection and body stress; also due to amiodarone use. TSH in 7 range -will continue low dose synthroid given PSVT  Obesity - Inadequate oral intake related to altered GI function as evidenced by limited intake, ongoing.  Body mass index is 30.33 kg/(m^2). -patient weight trending down -patient unable to tolerate diet and with positive leaking fistula  Code Status: FULL Family Communication: discussed with patient.  Disposition Plan: Will need long-term rehabilitation at SNF; once able to tolerate HD on recliner and with improve nutrition; last one very dificult as patient with positive leaking fistula and in need of TPN.    Consultants: Cardiology Nephrology PCCM Gen Surgery  Palliative care  Significant Events: 3/16 influenza A+ 3/18 R ACA stroke 3/19 MRI multifocal acute infarction of both hemispheres, large right distal ACA 3/22 TEE severe RAE, severely reduced RV function, severe TR, no LAA thrombus, small PFO on TEE 3/31 to OR ex lap for per pyloric ulcer and sm bowel resection 4/1 septic shock on pressors 4/3  pressors weaned off  4/4 restart pressors, CRRT 4/6 levo switched to neo 4/7 pelvic abscess on CT 4/8 IR placed pelvic drain 4/9 off pressors 4/11 long run of SVT. 4/23 VQ scan which demonstrated intermediate probability for PE 4/24 CT angiogram of her chest: Pending/unable to be done due to lack of access   Antibiotics: Zosyn >stopped on 4/22 (received a total of 23 days of treatment ) Vancomycin 4-16  DVT prophylaxis: SCDs  Objective: Blood pressure 77/46, pulse 100, temperature 98.5 F (36.9 C), temperature source Oral, resp. rate 16, height 5\' 6"  (1.676 m), weight 85.2 kg (187 lb 13.3 oz), SpO2 95 %.  Intake/Output Summary (Last 24 hours) at 07/30/14 1355 Last data filed at 07/30/14 1100  Gross per 24 hour  Intake   1490 ml  Output   2322 ml  Net   -832 ml   Exam: General: NAD.  Lungs: scattered rhonchi, mild expiratory wheezes and fair air movement, no frank crackles  Cardiovascular: Regular rate, no murmurs and no gallops   Abdomen: Abdominal wound dressing, clean and dry; abdomen slightly distended, drain intact and with /greenigh drainage; patient reports mild pain with palpation;  Extremities: No significant cyanosis, clubbing, edema bilateral lower extremities  Data Reviewed: Basic Metabolic Panel:  Recent Labs Lab 07/24/14 1419 07/27/14 1215 07/29/14 0415 07/30/14 0505  NA 135 137 134* 138  K 4.3  4.4 4.0 3.3*  CL 96 93* 94* 99  CO2 GLUCOSE 100* 93 55* 138*  BUN 25* 29* 29* 14  CREATININE 5.62* 7.02* 6.65* 5.20*  CALCIUM 8.2* 8.5 8.4 8.6  MG  --   --  2.0 2.0  PHOS 6.4*  --  6.6* 3.6    Liver Function Tests:  Recent Labs Lab 07/24/14 1419 07/29/14 0415 07/30/14 0505  AST  --  41* 36  ALT  --  18 20  ALKPHOS  --  137* 146*  BILITOT  --  1.2 0.8  PROT  --  6.2 7.7  ALBUMIN 1.7* 1.8* 2.1*   CBC:  Recent Labs Lab 07/24/14 1418 07/27/14 1215 07/29/14 2047 07/30/14 0505  WBC 9.4 11.4* 11.1* 11.1*  NEUTROABS  --   --   8.0*  --   HGB 9.1* 9.3* 9.7* 11.1*  HCT 29.4* 31.0* 31.4* 35.3*  MCV 92.5 93.4 92.6 92.2  PLT 308 288 342 420*    CBG:  Recent Labs Lab 07/29/14 1648 07/30/14 0044 07/30/14 0610 07/30/14 0733 07/30/14 1231  GLUCAP 73 113* 162* 135* 133*    Scheduled Meds:  Scheduled Meds: . amiodarone  200 mg Oral BID  . budesonide (PULMICORT) nebulizer solution  0.25 mg Nebulization BID  . darbepoetin (ARANESP) injection - DIALYSIS  150 mcg Intravenous Q Mon-HD  . insulin aspart  0-9 Units Subcutaneous 4 times per day  . levothyroxine  25 mcg Oral QAC breakfast  . metoprolol tartrate  25 mg Oral BID  . midodrine  10 mg Oral BID WC  . potassium chloride  10 mEq Intravenous Q1 Hr x 3  . sodium chloride  10-40 mL Intracatheter Q12H  . vancomycin  125 mg Oral 4 times per day    Time spent on care of this patient: 35 minutes   Alba Cory , MD  [phone] [facility] Office  [phone] Pager - Text Page per Loretha Stapler as per below:  On-Call/Text Page:      [email or web address]      password [email or web address]  If 7PM-7AM, please contact night-coverage [email or web address] Password [email or web address] 07/30/2014, 1:55 PM   LOS: 43 days

## 2014-07-30 NOTE — Progress Notes (Signed)
[name], female   DOB: 17-Jul-1955, 59 y.o.   MRN: [id number] 29 Days Post-Op  Subjective: Pt c/o some abdominal discomfort, but not bad.  Thirsty and wants to drink.  Objective: Vital signs in last 24 hours: Temp:  [97.4 F (36.3 C)-98.8 F (37.1 C)] 98.4 F (36.9 C) (04/28 0612) Pulse Rate:  [90-135] 113 (04/28 0612) Resp:  [16-18] 16 (04/28 0612) BP: (64-144)/(46-82) 64/46 mmHg (04/28 0612) SpO2:  [87 %-100 %] 93 % (04/28 0916) Weight:  [85.2 kg (187 lb 13.3 oz)-87.1 kg (192 lb 0.3 oz)] 85.2 kg (187 lb 13.3 oz) (04/28 0500) Last BM Date: 07/27/14  Intake/Output from previous day: 04/27 0701 - 04/28 0700 In: 1440 [P.O.:150; TPN:590] Out: 2395 [Drains:495] Intake/Output this shift: Total I/O In: 50 [P.O.:50] Out: 1 [Urine:1]  PE: Abd: soft, JP and cannister with very watered down green output.  +BS, VAC in place with serous drainage   Lab Results:   Recent Labs  07/29/14 2047 07/30/14 0505  WBC 11.1* 11.1*  HGB 9.7* 11.1*  HCT 31.4* 35.3*  PLT 342 420*   BMET  Recent Labs  07/29/14 0415 07/30/14 0505  NA 134* 138  K 4.0 3.3*  CL 94* 99  CO2 25 25  GLUCOSE 55* 138*  BUN 29* 14  CREATININE 6.65* 5.20*  CALCIUM 8.4 8.6   PT/INR No results for input(s): LABPROT, INR in the last 72 hours. CMP     Component Value Date/Time   NA 138 07/30/2014 0505   NA 139 10/02/2012 1046   K 3.3* 07/30/2014 0505   K 4.4 10/02/2012 1046   CL 99 07/30/2014 0505   CO2 25 07/30/2014 0505   CO2 25 10/02/2012 1046   GLUCOSE 138* 07/30/2014 0505   GLUCOSE 110 10/02/2012 1046   BUN 14 07/30/2014 0505   BUN 35.2* 10/02/2012 1046   CREATININE 5.20* 07/30/2014 0505   CREATININE 2.5* 10/02/2012 1046   CALCIUM 8.6 07/30/2014 0505   CALCIUM 9.8 10/02/2012 1046   PROT 7.7 07/30/2014 0505   PROT 8.4* 10/02/2012 1046   ALBUMIN 2.1* 07/30/2014 0505   ALBUMIN 3.1* 10/02/2012 1046   AST 36 07/30/2014 0505   AST 9 10/02/2012 1046   ALT 20 07/30/2014 0505   ALT  <6 Repeated and Verified 10/02/2012 1046   ALKPHOS 146* 07/30/2014 0505   ALKPHOS 108 10/02/2012 1046   BILITOT 0.8 07/30/2014 0505   BILITOT 0.21 10/02/2012 1046   GFRNONAA 8* 07/30/2014 0505   GFRAA 10* 07/30/2014 0505   Lipase  No results found for: LIPASE     Studies/Results: Ct Angio Chest Pe W/cm &/or Wo Cm  07/29/2014   CLINICAL DATA:  Shortness of breath. Patients brief history is as follows: "smoker with hx HTN, ESRD on HD (just began this admit), COPD, failed L AV fistula, initially admitted 3/16 with flu and acute hypoxic resp failure. Ultimately tx to [facility] due to need for HD. 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 Patient develops septic shock on 4-1, she was started on pressors and wean off pressors on 4-3. Subsequently she was diagnosed with pelvic abscess by CT scan perform on 4-7. She underwent pelvic drain placement by IR. Patient also develops C. diff diagnosed 4-12. C diff was not responding flagyl, and she was started on vancomycin 4-16. She also develops run of  SVT. Cardiology has been helping with management. Course complicated with high RV pressure and concerns for PE; also with fistula formation between anastomosis and blake drain, making her back to NPO state and in need of TPN.  EXAM: CT ANGIOGRAPHY CHEST WITH CONTRAST  TECHNIQUE: Multidetector CT imaging of the chest was performed using the standard protocol during bolus administration of intravenous contrast. Multiplanar CT image reconstructions and MIPs were obtained to evaluate the vascular anatomy.  CONTRAST:  80mL OMNIPAQUE IOHEXOL 350 MG/ML SOLN  COMPARISON:  Chest radiograph, 07/14/2014  FINDINGS: Angiographic study: No evidence of a pulmonary embolus. Aorta is normal in caliber. No dissection.  Thoracic inlet:  No masses or adenopathy.  Mediastinum and hila: Heart is mildly enlarged due to right  heart enlargement. No mediastinal or hilar masses or pathologically enlarged lymph nodes.  Lungs and pleura: No lung consolidation or edema. Mild emphysema. There are minor areas of peripheral reticular scarring and minor dependent subsegmental atelectasis. No pleural effusion or pneumothorax.  Limited upper abdomen: Partly image catheter lies above the proximal stomach. No acute findings in the upper abdomen.  Musculoskeletal:  No osteoblastic or osteolytic lesions.  Review of the MIP images confirms the above findings.  IMPRESSION: 1. No evidence of a pulmonary embolus. 2. No acute findings. 3. Cardiomegaly. 4. Mild emphysema.  No evidence of pneumonia or pulmonary edema.   Electronically Signed   By: Amie Portland M.D.   On: 07/29/2014 15:53   Ir Fluoro Guide Cv Line Left  07/29/2014   CLINICAL DATA:  End-stage renal disease, access for TPN  EXAM: LEFT IJ TUNNELED DOUBLE-LUMEN POWER PICC LINE PLACEMENT WITH ULTRASOUND AND FLUOROSCOPIC GUIDANCE  FLUOROSCOPY TIME:  3 minutes 24 seconds  PROCEDURE: The patient was advised of the possible risks andcomplications and agreed to undergo the procedure. The patient was then brought to the angiographic suite for the procedure.  The LEFT NECKwas prepped with chlorhexidine, drapedin the usual sterile fashion using maximum barrier technique (cap and mask, sterile gown, sterile gloves, large sterile sheet, hand hygiene and cutaneous antisepsis) and infiltrated locally with 1% Lidocaine.  Ultrasound demonstrated patency of the left internal jugular vein, and this was documented with an image. Under real-time ultrasound guidance, this vein was accessed with a 21 gauge micropuncture needle and image documentation was performed. A 0.018 wire was introduced in to the vein. Under sterile conditions and local anesthesia, a subcutaneous tunnel was created in the left infraclavicular chest. PICC line was tunneled subcutaneously to the venotomy site. Through a peel-away sheath, a  cuffed 5 [place] double lumen power PICC was advanced to the lower SVC/right atrial junction. Fluoroscopy during the procedure and fluoro spot radiograph confirms appropriate catheter position. The catheter was flushed and covered with asterile dressing.  Catheter length: 26  Complications: None immediate  IMPRESSION: Successful left internal jugulartunneled double-lumen Power PICC line placement with ultrasound and fluoroscopic guidance. The catheter is ready for use.   Electronically Signed   By: Judie Petit.  Shick M.D.   On: 07/29/2014 13:03   Ir US Guide Vasc Access Left  07/29/2014   CLINICAL DATA:  End-stage renal disease, access for TPN  EXAM: LEFT IJ TUNNELED DOUBLE-LUMEN POWER PICC LINE PLACEMENT WITH ULTRASOUND AND FLUOROSCOPIC GUIDANCE  FLUOROSCOPY TIME:  3 minutes 24 seconds  PROCEDURE: The patient was advised of the possible risks andcomplications and agreed to undergo the procedure. The patient was then brought to the angiographic suite for the procedure.  The LEFT NECKwas prepped with chlorhexidine, drapedin the usual  sterile fashion using maximum barrier technique (cap and mask, sterile gown, sterile gloves, large sterile sheet, hand hygiene and cutaneous antisepsis) and infiltrated locally with 1% Lidocaine.  Ultrasound demonstrated patency of the left internal jugular vein, and this was documented with an image. Under real-time ultrasound guidance, this vein was accessed with a 21 gauge micropuncture needle and image documentation was performed. A 0.018 wire was introduced in to the vein. Under sterile conditions and local anesthesia, a subcutaneous tunnel was created in the left infraclavicular chest. PICC line was tunneled subcutaneously to the venotomy site. Through a peel-away sheath, a cuffed 5 [place] double lumen power PICC was advanced to the lower SVC/right atrial junction. Fluoroscopy during the procedure and fluoro spot radiograph confirms appropriate catheter position. The catheter was flushed  and covered with asterile dressing.  Catheter length: 26  Complications: None immediate  IMPRESSION: Successful left internal jugulartunneled double-lumen Power PICC line placement with ultrasound and fluoroscopic guidance. The catheter is ready for use.   Electronically Signed   By: Judie Petit.  Shick M.D.   On: 07/29/2014 13:03    Anti-infectives: Anti-infectives    Start     Dose/Rate Route Frequency Ordered Stop   07/18/14 1800  vancomycin (VANCOCIN) 50 mg/mL oral solution 125 mg     125 mg Oral 4 times per day 07/18/14 1455     07/17/14 1830  vancomycin (VANCOCIN) IVPB 750 mg/150 ml premix     750 mg 150 mL/hr over 60 Minutes Intravenous  Once 07/17/14 1818 07/18/14 0024   07/15/14 1400  metroNIDAZOLE (FLAGYL) tablet 500 mg  Status:  Discontinued     500 mg Oral 3 times per day 07/15/14 1202 07/18/14 1533   07/13/14 2200  piperacillin-tazobactam (ZOSYN) IVPB 2.25 g  Status:  Discontinued     2.25 g 100 mL/hr over 30 Minutes Intravenous 3 times per day 07/13/14 1318 07/24/14 1138   07/13/14 2000  fluconazole (DIFLUCAN) IVPB 200 mg  Status:  Discontinued     200 mg 100 mL/hr over 60 Minutes Intravenous Every 24 hours 07/13/14 1318 07/19/14 1027   07/07/14 1200  vancomycin (VANCOCIN) IVPB 1000 mg/200 mL premix  Status:  Discontinued     1,000 mg 200 mL/hr over 60 Minutes Intravenous Every 24 hours 07/07/14 0937 07/14/14 1712   07/06/14 2000  fluconazole (DIFLUCAN) IVPB 400 mg  Status:  Discontinued     400 mg 100 mL/hr over 120 Minutes Intravenous Every 24 hours 07/06/14 1507 07/13/14 1318   07/06/14 1800  piperacillin-tazobactam (ZOSYN) IVPB 2.25 g  Status:  Discontinued     2.25 g 100 mL/hr over 30 Minutes Intravenous 4 times per day 07/06/14 1505 07/13/14 1318   07/05/14 2000  fluconazole (DIFLUCAN) IVPB 200 mg  Status:  Discontinued     200 mg 100 mL/hr over 60 Minutes Intravenous Every 24 hours 07/05/14 0757 07/06/14 1507   07/05/14 1400  piperacillin-tazobactam (ZOSYN) IVPB 2.25 g   Status:  Discontinued     2.25 g 100 mL/hr over 30 Minutes Intravenous 3 times per day 07/05/14 0749 07/06/14 1505   07/02/14 1800  vancomycin (VANCOCIN) IVPB 1000 mg/200 mL premix  Status:  Discontinued     1,000 mg 200 mL/hr over 60 Minutes Intravenous Every 24 hours 07/01/14 1858 07/05/14 0801   07/01/14 2200  piperacillin-tazobactam (ZOSYN) IVPB 3.375 g  Status:  Discontinued     3.375 g 100 mL/hr over 30 Minutes Intravenous 4 times per day 07/01/14 1858 07/05/14 0749   07/01/14 2000  fluconazole (DIFLUCAN) IVPB 400 mg  Status:  Discontinued     400 mg 100 mL/hr over 120 Minutes Intravenous Every 24 hours 07/01/14 1858 07/05/14 0757   07/01/14 1400  fluconazole (DIFLUCAN) IVPB 200 mg  Status:  Discontinued     200 mg 100 mL/hr over 60 Minutes Intravenous Every 24 hours 07/01/14 1330 07/01/14 1853   07/01/14 1000  piperacillin-tazobactam (ZOSYN) IVPB 2.25 g  Status:  Discontinued     2.25 g 100 mL/hr over 30 Minutes Intravenous Every 8 hours 07/01/14 0952 07/01/14 1853   07/01/14 1000  vancomycin (VANCOCIN) 500 mg in sodium chloride 0.9 % 100 mL IVPB     500 mg 100 mL/hr over 60 Minutes Intravenous  Once 07/01/14 0952 07/01/14 1126   06/30/14 1200  vancomycin (VANCOCIN) IVPB 1000 mg/200 mL premix     1,000 mg 200 mL/hr over 60 Minutes Intravenous  Once 06/30/14 0944 06/30/14 1403   06/30/14 1200  ceFEPIme (MAXIPIME) 2 g in dextrose 5 % 50 mL IVPB     2 g 100 mL/hr over 30 Minutes Intravenous  Once 06/30/14 0944 06/30/14 1425   06/30/14 0100  vancomycin (VANCOCIN) 2,000 mg in sodium chloride 0.9 % 500 mL IVPB     2,000 mg 250 mL/hr over 120 Minutes Intravenous  Once 06/30/14 0048 06/30/14 0525   06/30/14 0100  ceFEPIme (MAXIPIME) 2 g in dextrose 5 % 50 mL IVPB     2 g 100 mL/hr over 30 Minutes Intravenous  Once 06/30/14 0048 06/30/14 0322   06/27/14 0600  cefUROXime (ZINACEF) 1.5 g in dextrose 5 % 50 mL IVPB     1.5 g 100 mL/hr over 30 Minutes Intravenous On call to O.R. 06/26/14  1019 06/27/14 0630   06/19/14 1800  oseltamivir (TAMIFLU) capsule 30 mg     30 mg Oral Every M-W-F (1800) 06/19/14 1003 06/22/14 1841   06/18/14 1600  oseltamivir (TAMIFLU) capsule 30 mg  Status:  Discontinued     30 mg Oral Daily 06/18/14 1538 06/19/14 1003       Assessment/Plan   POD #29 s/p Exploratory Laparotomy with graham patch closure of perforated pyloric ulcer, SBR for ischemic bowel - 07/01/2014 - Derrell LollingIngram Intra-abdominal fluid collection, no drain Now with duodenal fistula -CT 07/24/14 showed 6.6x3.4cm pre-rectal fluid collection. no drain was replaced after previous drain accidentally pulled out -Fistula study done and shows JP drain connection to the duodenal bulb. - patient is currently being treated with NPO and TNA as she and her family are deciding whether to pursue an aggressive approach vs a palliative approach.  Continue to follow for their recommendations -VAC back on MWF -I&Os and routine drain care VTE prophylaxis-SCDs ID--Zosyn stopped on day #23 (07/24/14) C diff + -- Oral vanc New ESRD  PCM/TNA  LOS: 43 days    Eathon Valade E 07/30/2014, 10:48 AM Pager: [phone]

## 2014-07-30 NOTE — Progress Notes (Signed)
Sought opinion from IV team RN Rosey Batheresa if Morphine IV is ok to give on the pt's double-lumen central line while TPN is infusing on the other lumen. York SpanielSaid it was fine. Morphine IV 1mg  given via central line (capped lumen) per IV Team RN recommendation.

## 2014-07-31 LAB — GLUCOSE, CAPILLARY
Glucose-Capillary: 121 mg/dL — ABNORMAL HIGH (ref 70–99)
Glucose-Capillary: 140 mg/dL — ABNORMAL HIGH (ref 70–99)
Glucose-Capillary: 151 mg/dL — ABNORMAL HIGH (ref 70–99)
Glucose-Capillary: 158 mg/dL — ABNORMAL HIGH (ref 70–99)
Glucose-Capillary: 161 mg/dL — ABNORMAL HIGH (ref 70–99)

## 2014-07-31 LAB — BASIC METABOLIC PANEL
Anion gap: 13 (ref 5–15)
BUN: 36 mg/dL — AB (ref 6–23)
CO2: 30 mmol/L (ref 19–32)
CREATININE: 8.44 mg/dL — AB (ref 0.50–1.10)
Calcium: 8.8 mg/dL (ref 8.4–10.5)
Chloride: 94 mmol/L — ABNORMAL LOW (ref 96–112)
GFR calc non Af Amer: 5 mL/min — ABNORMAL LOW (ref >90)
GFR, EST AFRICAN AMERICAN: 5 mL/min — AB (ref >90)
Glucose, Bld: 131 mg/dL — ABNORMAL HIGH (ref 70–99)
POTASSIUM: 3 mmol/L — AB (ref 3.5–5.1)
Sodium: 137 mmol/L (ref 135–145)

## 2014-07-31 MED ORDER — PNEUMOCOCCAL VAC POLYVALENT 25 MCG/0.5ML IJ INJ
0.5000 mL | INJECTION | INTRAMUSCULAR | Status: AC
  Administered 2014-08-01: 0.5 mL via INTRAMUSCULAR
  Filled 2014-07-31: qty 0.5

## 2014-07-31 MED ORDER — POTASSIUM CHLORIDE 10 MEQ/50ML IV SOLN
10.0000 meq | INTRAVENOUS | Status: AC
  Filled 2014-07-31 (×3): qty 50

## 2014-07-31 MED ORDER — TRACE MINERALS CR-CU-F-FE-I-MN-MO-SE-ZN IV SOLN
INTRAVENOUS | Status: AC
  Administered 2014-07-31: 19:00:00 via INTRAVENOUS
  Filled 2014-07-31: qty 1920

## 2014-07-31 MED ORDER — FAT EMULSION 20 % IV EMUL
240.0000 mL | INTRAVENOUS | Status: AC
Start: 2014-07-31 — End: 2014-08-01
  Administered 2014-07-31: 240 mL via INTRAVENOUS
  Filled 2014-07-31: qty 250

## 2014-07-31 NOTE — Progress Notes (Signed)
PARENTERAL NUTRITION CONSULT NOTE - Follow-up  Pharmacy Consult:  TPN Indication:  Duodenal fistula  No Known Allergies   Patient Measurements: Height: _0  (167.6 cm) Weight: 189 lb (85.73 kg) IBW/kg (Calculated) : 59.3  Adjusted body weight: 67 kg  Vital Signs: Temp: 97.6 F (36.4 C) (04/29 0503) Temp Source: Oral (04/29 0503) BP: 108/67 mmHg (04/29 0503) Pulse Rate: 102 (04/29 0503) Intake/Output from previous day: 04/28 0701 - 04/29 0700 In: 1810.3 [P.O.:230; TPN:1580.3] Out: 2232 [Urine:2; Drains:2230]  Labs:  Recent Labs  07/29/14 2047 07/30/14 0505  WBC 11.1* 11.1*  HGB 9.7* 11.1*  HCT 31.4* 35.3*  PLT 342 420*     Recent Labs  07/29/14 0415 07/30/14 0505  NA 134* 138  K 4.0 3.3*  CL 94* 99  CO2 25 25  GLUCOSE 55* 138*  BUN 29* 14  CREATININE 6.65* 5.20*  CALCIUM 8.4 8.6  MG 2.0 2.0  PHOS 6.6* 3.6  PROT 6.2 7.7  ALBUMIN 1.8* 2.1*  AST 41* 36  ALT 18 20  ALKPHOS 137* 146*  BILITOT 1.2 0.8  PREALBUMIN 9*  --   TRIG 178*  --    Estimated Creatinine Clearance: 13 mL/min (by C-G formula based on Cr of 5.2).    Recent Labs  07/30/14 1553 07/30/14 2359 07/31/14 0620  GLUCAP 123* 140* 161*   Insulin Requirements in the past 24 hours:  5 units Novolog SSI  Assessment: 41 YOF with complicated hospital course. S/p closure perforated pyloric ulcer, SBR for ischemic necrosis, diffuse peritonitis on 07/01/14. Pharmacy consulted to provide TPN 4/1-4/15 for nutrition support for prolonged ileus. Pt then transitioned to po diet + supplements. 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  GI: Duodenal fistula. NPO. On TPN. Drain o/p 2230 ml/24h. Albumin 2.1. Prealbumin remains low at 9. Noted palliative care discussions ongoing but for now pt/family wish for aggressive care.  Endo: h/o DM. CBGs well controlled thus far on TPN and minimal SSI, had low CBGs prior to TPN start.   Lytes: Will plan for none in TPN since pt on  iHD. K 3.0 (goal >/=4 with afib), Phos 3.6, Mg 2 (goal >/= 2 with afib), CoCa 10.1 (Ca x Phos product = 36)  Renal: New ESRD - started 3/17. HD on M/W/F. Tolerated HD 4/27 BFR 300 x 4h. Noted pt continues with volume overload. I/O -2230 today.  Pulm: asthma, COPD. 2L Short. CT negative for PE.  Cards: HTN. BP quite variable. HR 82-102 (afib). Cards following. Midodrine, metoprolol, amio. CHADS-VASc =4 (not good candidate for [facility]).  Hepatotobil: Alk phos elevated, other LFTs wnl. TG 178  Neuro: hx depression / insomnia - new stroke noted on 3/18.   ID: Pt continues on oral vanc D#8/14 for cdiff colitis. Diarrhea is better.  Best Practices: SCDs, PPI IV  TPN Access: PICC line in IR 4/27  TPN start date: 4/1->4/15; restart 4/27>>  Current Nutrition:  NPO Clinimix 5/15 (no electrolytes) at 72m/hr and 20% IVFE at 168mhr which will provide 1160 kcal and 48 gm protein.  Nutritional Goals: (per RD note 4/28) 2100-2300 kCal, 120-140 grams of protein per day Goal: Clinimix 5/15 at 10037mr + 20% IVFE 44m59m to provide 2184 kcal and 120 gm protein  Plan:  - Increase Clinimix 5/15 (no electrolytes) to 80 ml/hr and 20% IVFE at 44ml25m -Will advance to goal as tolerated. -Replete K with 3 x 10 mEq IV. - Trace elements and MVI daily in TPN.  -  Continue sensitive SSI q6h - BMET in a.m.  Thanks for allowing pharmacy to be a part of this patient's care.  Excell Seltzer, PharmD Clinical Pharmacist, [phone] 07/31/2014 7:55 AM

## 2014-07-31 NOTE — Progress Notes (Signed)
[name] Progress Note  Assessment/Plan: 1. New ESRD - [facility] / started HD 3/17,k 4.0 / R IJ catheter used sec. difficulties using AVF;  need to draw as many labs as possible on HD; try recliner today -2 K bath ordered for today - check labs - may need to change to 3 or 4 now that not taking pos 2. Vol excess -improved. 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 4. Dialysis access - AVF @ [facility] placed 01/28/14 by Dr. Darrick PennaFields, functioned poorly, revision pending as outpatient vs doing revision prior to d/c; using R IJ catheter placed by Dr. Hart RochesterLawson 3/26.  5. Perforated pyloric ulcer / necrotic bowel - s/p repair of perforated ulcer & partial ileum resection 3/30, off Zosyn. On PO Vanco / Per surgery/npo NOW with last CT="showed 6.6x3.4cm pre-rectal fluid collection/ could not rule out gastric leak because contrast did not fill stomach"Thus TPN to started 6. Recurrent sepsis - with percutaneous drain (placed 4/8) of pelvic fluid; CCS following  7. C diff colitis - on PO Vancomycin 8. Hypotension /right heart failure - on midodrine 9. Anemia - Hgb 9.1,>9.3> 11.1 /s/p 2 U PRBCs 4/13, on Aranesp 150 mcg on Mon; Fe 5% on 3/18.-check FE studies  10. Sec HPT - Corr Ca 8.6 correct=10.1/ P 3.6 , iPTH 338 - not on Hectorol - not on binders for now/ / using 2.0 ca bath  11. PCM - Alb 2.1 , NPONow onTPN 12. H1N1 Influenza with resp failure/- resolved from admit - no coughing or sob this am  13. Disp - severely deconditioned; LTAC recommended bu" Medicaid will not  pay for this -" Palliative Care following  14. 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 DM type 2 16. Copd/ Asthma- on - pulmicort and continue using flutter valve/pulmonary hygiene and xopenex 17. Goals of care - Palliative Care working with family/pt - still full code Sheffield SliderMartha B Gustavus Haskin, PA-C  Kidney [name] Beeper [phone] 07/31/2014,10:24 AM  LOS: 44 days   Subjective:   Sore abdomen.  Objective Filed Vitals:   07/30/14 2134 07/30/14 2206 07/31/14 0503 07/31/14 0910  BP: 100/60  108/67   Pulse: 82  102   Temp: 97.7 F (36.5 C)  97.6 F (36.4 C)   TempSrc: Oral  Oral   Resp: 16  16   Height: 5\' 6"  (1.676 m)     Weight: 85.73 kg (189 lb)     SpO2:  96% 95% 96%   Physical Exam General: NAD Heart: tachy reg Lungs: no overt rales Abdomen: VAC midline, drain, soft, tender/no sig dependent edema Extremities: no LE edema Dialysis Access: right IJ and left AVF + bruit  Dialysis Orders: new start, on MWF schedule 85.2 post HD 4/28 am  Additional Objective Labs: Basic Metabolic Panel:  Recent Labs Lab 07/24/14 1419 07/27/14 1215 07/29/14 0415 07/30/14 0505  NA 135 137 134* 138  K 4.3 4.4 4.0 3.3*  CL 96 93* 94* 99  CO2 25 27  25 25  GLUCOSE 100* 93 55* 138*  BUN 25* 29* 29* 14  CREATININE 5.62* 7.02* 6.65* 5.20*  CALCIUM 8.2* 8.5 8.4 8.6  PHOS 6.4*  --  6.6* 3.6   Liver Function Tests:  Recent Labs Lab 07/24/14 1419 07/29/14 0415 07/30/14 0505  AST  --  41* 36  ALT  --  18 20  ALKPHOS  --  137* 146*  BILITOT  --  1.2 0.8  PROT  --  6.2 7.7  ALBUMIN 1.7* 1.8* 2.1*   CBC:  Recent Labs Lab 07/24/14 1418 07/27/14 1215 07/29/14 2047 07/30/14 0505  WBC 9.4 11.4* 11.1* 11.1*  NEUTROABS  --   --  8.0*  --   HGB 9.1* 9.3* 9.7* 11.1*  HCT 29.4* 31.0* 31.4* 35.3*  MCV 92.5 93.4 92.6 92.2  PLT 308 288 342 420*    Recent Labs Lab 07/30/14 1231 07/30/14 1553  07/30/14 2359 07/31/14 0620 07/31/14 0818  GLUCAP 133* 123* 140* 161* 121*  Studies/Results: Ct Angio Chest Pe W/cm &/or Wo Cm  07/29/2014   CLINICAL DATA:  Shortness of breath. Patients brief history is as follows: "smoker with hx HTN, ESRD on HD (just began this admit), COPD, failed L AV fistula, initially admitted 3/16 with flu and acute hypoxic resp failure. Ultimately tx to [facility] due to need for HD. 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 Patient develops septic shock on 4-1, she was started on pressors and wean off pressors on 4-3. Subsequently she was diagnosed with pelvic abscess by CT scan perform on 4-7. She underwent pelvic drain placement by IR. Patient also develops C. diff diagnosed 4-12. C diff was not responding flagyl, and she was started on vancomycin 4-16. She also develops run of SVT. Cardiology has been helping with management. Course complicated with high RV pressure and concerns for PE; also with fistula formation between anastomosis and blake drain, making her back to NPO state and in need of TPN.  EXAM: CT ANGIOGRAPHY CHEST WITH CONTRAST  TECHNIQUE: Multidetector CT imaging of the chest was performed using the standard protocol during bolus administration of intravenous contrast. Multiplanar CT image reconstructions and MIPs were obtained to evaluate the vascular anatomy.  CONTRAST:  80mL OMNIPAQUE IOHEXOL 350 MG/ML SOLN  COMPARISON:  Chest radiograph, 07/14/2014  FINDINGS: Angiographic study: No evidence of a pulmonary embolus. Aorta is normal in caliber. No dissection.  Thoracic inlet:  No masses or adenopathy.  Mediastinum and hila: Heart is mildly enlarged due to right heart enlargement. No mediastinal or hilar masses or pathologically enlarged lymph nodes.  Lungs and pleura: No lung consolidation or edema. Mild emphysema. There are minor areas of peripheral  reticular scarring and minor dependent subsegmental atelectasis. No pleural effusion or pneumothorax.  Limited upper abdomen: Partly image catheter lies above the proximal stomach. No acute findings in the upper abdomen.  Musculoskeletal:  No osteoblastic or osteolytic lesions.  Review of the MIP images confirms the above findings.  IMPRESSION: 1. No evidence of a pulmonary embolus. 2. No acute findings. 3. Cardiomegaly. 4. Mild emphysema.  No evidence of pneumonia or pulmonary edema.   Electronically Signed   By: Amie Portland M.D.   On: 07/29/2014 15:53   Ir Fluoro Guide Cv Line Left  07/29/2014   CLINICAL DATA:  End-stage renal disease, access for TPN  EXAM: LEFT IJ  TUNNELED DOUBLE-LUMEN POWER PICC LINE PLACEMENT WITH ULTRASOUND AND FLUOROSCOPIC GUIDANCE  FLUOROSCOPY TIME:  3 minutes 24 seconds  PROCEDURE: The patient was advised of the possible risks andcomplications and agreed to undergo the procedure. The patient was then brought to the angiographic suite for the procedure.  The LEFT NECKwas prepped with chlorhexidine, drapedin the usual sterile fashion using maximum barrier technique (cap and mask, sterile gown, sterile gloves, large sterile sheet, hand hygiene and cutaneous antisepsis) and infiltrated locally with 1% Lidocaine.  Ultrasound demonstrated patency of the left internal jugular vein, and this was documented with an image. Under real-time ultrasound guidance, this vein was accessed with a 21 gauge micropuncture needle and image documentation was performed. A 0.018 wire was introduced in to the vein. Under sterile conditions and local anesthesia, a subcutaneous tunnel was created in the left infraclavicular chest. PICC line was tunneled subcutaneously to the venotomy site. Through a peel-away sheath, a cuffed 5 [place] double lumen power PICC was advanced to the lower SVC/right atrial junction. Fluoroscopy during the procedure and fluoro spot radiograph confirms appropriate catheter position. The  catheter was flushed and covered with asterile dressing.  Catheter length: 26  Complications: None immediate  IMPRESSION: Successful left internal jugulartunneled double-lumen Power PICC line placement with ultrasound and fluoroscopic guidance. The catheter is ready for use.   Electronically Signed   By: Judie Petit.  Shick M.D.   On: 07/29/2014 13:03   Ir US Guide Vasc Access Left  07/29/2014   CLINICAL DATA:  End-stage renal disease, access for TPN  EXAM: LEFT IJ TUNNELED DOUBLE-LUMEN POWER PICC LINE PLACEMENT WITH ULTRASOUND AND FLUOROSCOPIC GUIDANCE  FLUOROSCOPY TIME:  3 minutes 24 seconds  PROCEDURE: The patient was advised of the possible risks andcomplications and agreed to undergo the procedure. The patient was then brought to the angiographic suite for the procedure.  The LEFT NECKwas prepped with chlorhexidine, drapedin the usual sterile fashion using maximum barrier technique (cap and mask, sterile gown, sterile gloves, large sterile sheet, hand hygiene and cutaneous antisepsis) and infiltrated locally with 1% Lidocaine.  Ultrasound demonstrated patency of the left internal jugular vein, and this was documented with an image. Under real-time ultrasound guidance, this vein was accessed with a 21 gauge micropuncture needle and image documentation was performed. A 0.018 wire was introduced in to the vein. Under sterile conditions and local anesthesia, a subcutaneous tunnel was created in the left infraclavicular chest. PICC line was tunneled subcutaneously to the venotomy site. Through a peel-away sheath, a cuffed 5 [place] double lumen power PICC was advanced to the lower SVC/right atrial junction. Fluoroscopy during the procedure and fluoro spot radiograph confirms appropriate catheter position. The catheter was flushed and covered with asterile dressing.  Catheter length: 26  Complications: None immediate  IMPRESSION: Successful left internal jugulartunneled double-lumen Power PICC line placement with ultrasound  and fluoroscopic guidance. The catheter is ready for use.   Electronically Signed   By: Judie Petit.  Shick M.D.   On: 07/29/2014 13:03   Medications: . TPN (CLINIMIX) Adult without lytes 70 mL/hr at 07/30/14 1721   And  . fat emulsion 240 mL (07/30/14 1721)   . amiodarone  200 mg Oral BID  . budesonide (PULMICORT) nebulizer solution  0.25 mg Nebulization BID  . darbepoetin (ARANESP) injection - DIALYSIS  150 mcg Intravenous Q Mon-HD  . insulin aspart  0-9 Units Subcutaneous 4 times per day  . levothyroxine  25 mcg Oral QAC breakfast  . metoprolol tartrate  25 mg Oral BID  .  midodrine  10 mg Oral BID WC  . sodium chloride  10-40 mL Intracatheter Q12H  . vancomycin  125 mg Oral 4 times per day

## 2014-07-31 NOTE — Procedures (Signed)
Patient seen on Hemodialysis----comfortable in a recliner (but only 30minutes in). QB 400, UF goal 2L Treatment adjusted as needed.  Zetta BillsJay Willowdean Luhmann MD [facility]. Office # [phone] Pager # [phone] 2:08 PM

## 2014-07-31 NOTE — Progress Notes (Signed)
Unfortunately I did not get a chance to see today prior to dialysis session. I did see her while she was still on dialysis, but she was distracted and difficult to engage with.  I will follow-up with her on Monday.  I am here over weekend and if more urgent needs arise, please feel free to contact me.   Orvis BrillAaron J. Mariel Lukins D.O. Palliative Medicine Team at [facility]  Pager: [phone] Team Phone: [phone]

## 2014-07-31 NOTE — Progress Notes (Signed)
Patient discontinued treatment 30 minutes early. Patient received hemodialysis using dialysis recliner. Patient was very uncomfortable and distracted. Patient requested to use the restroom while on HD. Unable to place a bed pan under the patient while in the recliner. Patient requested to end treatment in order to be placed back in her hospital bed and placed on the bed pan.

## 2014-07-31 NOTE — Clinical Social Work Note (Signed)
Clinical Social Worker continuing to follow patient and family for support and discharge planning needs.  Patient remains not medically ready for SNF placement due to TPN, drain to suction, inability to sit for dialysis and not yet clipped for outpatient dialysis.  Patient has Medicaid only, therefore LTAC is not currently an option.  CSW remains available for support and to facilitate patient discharge needs once medically stable.  Stephanie GoldsJesse Apoorva Bugay, LCSW [phone]  (Covering for [name], [phone])

## 2014-07-31 NOTE — Progress Notes (Signed)
[name], female   DOB: 1955/12/27, 59 y.o.   MRN: [id number] 30 Days Post-Op  Subjective: Pt has some abdominal discomfort today.  Otherwise doesn't speak much  Objective: Vital signs in last 24 hours: Temp:  [97.6 F (36.4 C)-98 F (36.7 C)] 97.6 F (36.4 C) (04/29 0503) Pulse Rate:  [82-102] 102 (04/29 0503) Resp:  [16-18] 16 (04/29 0503) BP: (100-108)/(60-75) 108/67 mmHg (04/29 0503) SpO2:  [94 %-96 %] 96 % (04/29 0910) Weight:  [85.73 kg (189 lb)] 85.73 kg (189 lb) (04/28 2134) Last BM Date: 07/27/14  Intake/Output from previous day: 04/28 0701 - 04/29 0700 In: 1810.3 [P.O.:230; TPN:1580.3] Out: 2232 [Urine:2; Drains:2230] Intake/Output this shift:    PE: Abd: soft, mildly tender, VAC in place, drain with much more enteric appearing content today.  Drainage significantly increased yesterday to 2200cc from generally between 400-600cc.  Lab Results:   Recent Labs  07/29/14 2047 07/30/14 0505  WBC 11.1* 11.1*  HGB 9.7* 11.1*  HCT 31.4* 35.3*  PLT 342 420*   BMET  Recent Labs  07/29/14 0415 07/30/14 0505  NA 134* 138  K 4.0 3.3*  CL 94* 99  CO2 25 25  GLUCOSE 55* 138*  BUN 29* 14  CREATININE 6.65* 5.20*  CALCIUM 8.4 8.6   PT/INR No results for input(s): LABPROT, INR in the last 72 hours. CMP     Component Value Date/Time   NA 138 07/30/2014 0505   NA 139 10/02/2012 1046   K 3.3* 07/30/2014 0505   K 4.4 10/02/2012 1046   CL 99 07/30/2014 0505   CO2 25 07/30/2014 0505   CO2 25 10/02/2012 1046   GLUCOSE 138* 07/30/2014 0505   GLUCOSE 110 10/02/2012 1046   BUN 14 07/30/2014 0505   BUN 35.2* 10/02/2012 1046   CREATININE 5.20* 07/30/2014 0505   CREATININE 2.5* 10/02/2012 1046   CALCIUM 8.6 07/30/2014 0505   CALCIUM 9.8 10/02/2012 1046   PROT 7.7 07/30/2014 0505   PROT 8.4* 10/02/2012 1046   ALBUMIN 2.1* 07/30/2014 0505   ALBUMIN 3.1* 10/02/2012 1046   AST 36 07/30/2014 0505   AST 9 10/02/2012 1046   ALT 20 07/30/2014 0505    ALT <6 Repeated and Verified 10/02/2012 1046   ALKPHOS 146* 07/30/2014 0505   ALKPHOS 108 10/02/2012 1046   BILITOT 0.8 07/30/2014 0505   BILITOT 0.21 10/02/2012 1046   GFRNONAA 8* 07/30/2014 0505   GFRAA 10* 07/30/2014 0505   Lipase  No results found for: LIPASE     Studies/Results: Ct Angio Chest Pe W/cm &/or Wo Cm  07/29/2014   CLINICAL DATA:  Shortness of breath. Patients brief history is as follows: "smoker with hx HTN, ESRD on HD (just began this admit), COPD, failed L AV fistula, initially admitted 3/16 with flu and acute hypoxic resp failure. Ultimately tx to [facility] due to need for HD. 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 Patient develops septic shock on 4-1, she was started on pressors and wean off pressors on 4-3. Subsequently she was diagnosed with pelvic abscess by CT scan perform on 4-7. She underwent pelvic drain placement by IR. Patient also develops C. diff diagnosed 4-12. C diff was not responding flagyl, and she was started on vancomycin 4-16. She also develops run of SVT. Cardiology has been helping with management. Course complicated with high  RV pressure and concerns for PE; also with fistula formation between anastomosis and blake drain, making her back to NPO state and in need of TPN.  EXAM: CT ANGIOGRAPHY CHEST WITH CONTRAST  TECHNIQUE: Multidetector CT imaging of the chest was performed using the standard protocol during bolus administration of intravenous contrast. Multiplanar CT image reconstructions and MIPs were obtained to evaluate the vascular anatomy.  CONTRAST:  80mL OMNIPAQUE IOHEXOL 350 MG/ML SOLN  COMPARISON:  Chest radiograph, 07/14/2014  FINDINGS: Angiographic study: No evidence of a pulmonary embolus. Aorta is normal in caliber. No dissection.  Thoracic inlet:  No masses or adenopathy.  Mediastinum and hila: Heart is mildly enlarged due to  right heart enlargement. No mediastinal or hilar masses or pathologically enlarged lymph nodes.  Lungs and pleura: No lung consolidation or edema. Mild emphysema. There are minor areas of peripheral reticular scarring and minor dependent subsegmental atelectasis. No pleural effusion or pneumothorax.  Limited upper abdomen: Partly image catheter lies above the proximal stomach. No acute findings in the upper abdomen.  Musculoskeletal:  No osteoblastic or osteolytic lesions.  Review of the MIP images confirms the above findings.  IMPRESSION: 1. No evidence of a pulmonary embolus. 2. No acute findings. 3. Cardiomegaly. 4. Mild emphysema.  No evidence of pneumonia or pulmonary edema.   Electronically Signed   By: Amie Portland M.D.   On: 07/29/2014 15:53   Ir Fluoro Guide Cv Line Left  07/29/2014   CLINICAL DATA:  End-stage renal disease, access for TPN  EXAM: LEFT IJ TUNNELED DOUBLE-LUMEN POWER PICC LINE PLACEMENT WITH ULTRASOUND AND FLUOROSCOPIC GUIDANCE  FLUOROSCOPY TIME:  3 minutes 24 seconds  PROCEDURE: The patient was advised of the possible risks andcomplications and agreed to undergo the procedure. The patient was then brought to the angiographic suite for the procedure.  The LEFT NECKwas prepped with chlorhexidine, drapedin the usual sterile fashion using maximum barrier technique (cap and mask, sterile gown, sterile gloves, large sterile sheet, hand hygiene and cutaneous antisepsis) and infiltrated locally with 1% Lidocaine.  Ultrasound demonstrated patency of the left internal jugular vein, and this was documented with an image. Under real-time ultrasound guidance, this vein was accessed with a 21 gauge micropuncture needle and image documentation was performed. A 0.018 wire was introduced in to the vein. Under sterile conditions and local anesthesia, a subcutaneous tunnel was created in the left infraclavicular chest. PICC line was tunneled subcutaneously to the venotomy site. Through a peel-away sheath,  a cuffed 5 [place] double lumen power PICC was advanced to the lower SVC/right atrial junction. Fluoroscopy during the procedure and fluoro spot radiograph confirms appropriate catheter position. The catheter was flushed and covered with asterile dressing.  Catheter length: 26  Complications: None immediate  IMPRESSION: Successful left internal jugulartunneled double-lumen Power PICC line placement with ultrasound and fluoroscopic guidance. The catheter is ready for use.   Electronically Signed   By: Judie Petit.  Shick M.D.   On: 07/29/2014 13:03   Ir US Guide Vasc Access Left  07/29/2014   CLINICAL DATA:  End-stage renal disease, access for TPN  EXAM: LEFT IJ TUNNELED DOUBLE-LUMEN POWER PICC LINE PLACEMENT WITH ULTRASOUND AND FLUOROSCOPIC GUIDANCE  FLUOROSCOPY TIME:  3 minutes 24 seconds  PROCEDURE: The patient was advised of the possible risks andcomplications and agreed to undergo the procedure. The patient was then brought to the angiographic suite for the procedure.  The LEFT NECKwas prepped with chlorhexidine, drapedin the usual sterile fashion using maximum barrier technique (cap and mask, sterile gown,  sterile gloves, large sterile sheet, hand hygiene and cutaneous antisepsis) and infiltrated locally with 1% Lidocaine.  Ultrasound demonstrated patency of the left internal jugular vein, and this was documented with an image. Under real-time ultrasound guidance, this vein was accessed with a 21 gauge micropuncture needle and image documentation was performed. A 0.018 wire was introduced in to the vein. Under sterile conditions and local anesthesia, a subcutaneous tunnel was created in the left infraclavicular chest. PICC line was tunneled subcutaneously to the venotomy site. Through a peel-away sheath, a cuffed 5 [place] double lumen power PICC was advanced to the lower SVC/right atrial junction. Fluoroscopy during the procedure and fluoro spot radiograph confirms appropriate catheter position. The catheter was  flushed and covered with asterile dressing.  Catheter length: 26  Complications: None immediate  IMPRESSION: Successful left internal jugulartunneled double-lumen Power PICC line placement with ultrasound and fluoroscopic guidance. The catheter is ready for use.   Electronically Signed   By: Judie PetitM.  Shick M.D.   On: 07/29/2014 13:03    Anti-infectives: Anti-infectives    Start     Dose/Rate Route Frequency Ordered Stop   07/18/14 1800  vancomycin (VANCOCIN) 50 mg/mL oral solution 125 mg     125 mg Oral 4 times per day 07/18/14 1455     07/17/14 1830  vancomycin (VANCOCIN) IVPB 750 mg/150 ml premix     750 mg 150 mL/hr over 60 Minutes Intravenous  Once 07/17/14 1818 07/18/14 0024   07/15/14 1400  metroNIDAZOLE (FLAGYL) tablet 500 mg  Status:  Discontinued     500 mg Oral 3 times per day 07/15/14 1202 07/18/14 1533   07/13/14 2200  piperacillin-tazobactam (ZOSYN) IVPB 2.25 g  Status:  Discontinued     2.25 g 100 mL/hr over 30 Minutes Intravenous 3 times per day 07/13/14 1318 07/24/14 1138   07/13/14 2000  fluconazole (DIFLUCAN) IVPB 200 mg  Status:  Discontinued     200 mg 100 mL/hr over 60 Minutes Intravenous Every 24 hours 07/13/14 1318 07/19/14 1027   07/07/14 1200  vancomycin (VANCOCIN) IVPB 1000 mg/200 mL premix  Status:  Discontinued     1,000 mg 200 mL/hr over 60 Minutes Intravenous Every 24 hours 07/07/14 0937 07/14/14 1712   07/06/14 2000  fluconazole (DIFLUCAN) IVPB 400 mg  Status:  Discontinued     400 mg 100 mL/hr over 120 Minutes Intravenous Every 24 hours 07/06/14 1507 07/13/14 1318   07/06/14 1800  piperacillin-tazobactam (ZOSYN) IVPB 2.25 g  Status:  Discontinued     2.25 g 100 mL/hr over 30 Minutes Intravenous 4 times per day 07/06/14 1505 07/13/14 1318   07/05/14 2000  fluconazole (DIFLUCAN) IVPB 200 mg  Status:  Discontinued     200 mg 100 mL/hr over 60 Minutes Intravenous Every 24 hours 07/05/14 0757 07/06/14 1507   07/05/14 1400  piperacillin-tazobactam (ZOSYN) IVPB 2.25  g  Status:  Discontinued     2.25 g 100 mL/hr over 30 Minutes Intravenous 3 times per day 07/05/14 0749 07/06/14 1505   07/02/14 1800  vancomycin (VANCOCIN) IVPB 1000 mg/200 mL premix  Status:  Discontinued     1,000 mg 200 mL/hr over 60 Minutes Intravenous Every 24 hours 07/01/14 1858 07/05/14 0801   07/01/14 2200  piperacillin-tazobactam (ZOSYN) IVPB 3.375 g  Status:  Discontinued     3.375 g 100 mL/hr over 30 Minutes Intravenous 4 times per day 07/01/14 1858 07/05/14 0749   07/01/14 2000  fluconazole (DIFLUCAN) IVPB 400 mg  Status:  Discontinued  400 mg 100 mL/hr over 120 Minutes Intravenous Every 24 hours 07/01/14 1858 07/05/14 0757   07/01/14 1400  fluconazole (DIFLUCAN) IVPB 200 mg  Status:  Discontinued     200 mg 100 mL/hr over 60 Minutes Intravenous Every 24 hours 07/01/14 1330 07/01/14 1853   07/01/14 1000  piperacillin-tazobactam (ZOSYN) IVPB 2.25 g  Status:  Discontinued     2.25 g 100 mL/hr over 30 Minutes Intravenous Every 8 hours 07/01/14 0952 07/01/14 1853   07/01/14 1000  vancomycin (VANCOCIN) 500 mg in sodium chloride 0.9 % 100 mL IVPB     500 mg 100 mL/hr over 60 Minutes Intravenous  Once 07/01/14 0952 07/01/14 1126   06/30/14 1200  vancomycin (VANCOCIN) IVPB 1000 mg/200 mL premix     1,000 mg 200 mL/hr over 60 Minutes Intravenous  Once 06/30/14 0944 06/30/14 1403   06/30/14 1200  ceFEPIme (MAXIPIME) 2 g in dextrose 5 % 50 mL IVPB     2 g 100 mL/hr over 30 Minutes Intravenous  Once 06/30/14 0944 06/30/14 1425   06/30/14 0100  vancomycin (VANCOCIN) 2,000 mg in sodium chloride 0.9 % 500 mL IVPB     2,000 mg 250 mL/hr over 120 Minutes Intravenous  Once 06/30/14 0048 06/30/14 0525   06/30/14 0100  ceFEPIme (MAXIPIME) 2 g in dextrose 5 % 50 mL IVPB     2 g 100 mL/hr over 30 Minutes Intravenous  Once 06/30/14 0048 06/30/14 0322   06/27/14 0600  cefUROXime (ZINACEF) 1.5 g in dextrose 5 % 50 mL IVPB     1.5 g 100 mL/hr over 30 Minutes Intravenous On call to O.R.  06/26/14 1019 06/27/14 0630   06/19/14 1800  oseltamivir (TAMIFLU) capsule 30 mg     30 mg Oral Every M-W-F (1800) 06/19/14 1003 06/22/14 1841   06/18/14 1600  oseltamivir (TAMIFLU) capsule 30 mg  Status:  Discontinued     30 mg Oral Daily 06/18/14 1538 06/19/14 1003       Assessment/Plan  POD #30 s/p Exploratory Laparotomy with graham patch closure of perforated pyloric ulcer, SBR for ischemic bowel - 07/01/2014 - Derrell Lolling Intra-abdominal fluid collection, no drain Now with duodenal fistula -CT 07/24/14 showed 6.6x3.4cm pre-rectal fluid collection. no drain was replaced after previous drain accidentally pulled out -Fistula study done and shows JP drain connection to the duodenal bulb. - patient is currently being treated with NPO and TNA as she and her family have wished for aggressive treatment at this point.  Her output from her drain significantly increased yesterday to 2200cc.  Follow closely.  Strict NPO -VAC back on MWF -I&Os and routine drain care VTE prophylaxis-SCDs ID--Zosyn stopped on day #23 (07/24/14) C diff + -- Oral vanc New ESRD  PCM/TNA   LOS: 44 days    Sarahi Borland E 07/31/2014, 10:01 AM Pager: [phone]

## 2014-07-31 NOTE — Progress Notes (Signed)
Progress Note [facility].   [name] WUJ:[id number] DOB: 14-Dec-1955 DOA: 06/17/2014 PCP: Willey BladeEAN, [name], MD  Admit HPI / Brief Narrative53: 58 yo female smoker with hx HTN, ESRD on HD (just began this admit), COPD, failed L AV fistula, initially admitted 3/16 with flu and acute hypoxic resp failure. Ultimately tx to [facility] due to need for HD. 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   Patient on 3/31 was taking to the OR for ex lap for per pyloric ulcer and sm bowel resection. Patient develops septic shock on 4-1, she was started on pressors and wean off pressors on 4-3. Subsequently she was diagnosed with pelvic abscess by CT scan perform on 4-7. She underwent pelvic drain placement by IR. Patient also develops C. diff diagnosed 4-12.  C diff was not responding flagyl, and she was started on vancomycin 4-16. She also develops  run of SVT. Cardiology has been helping with management. Course complicated with high RV pressure and concerns for PE; also with fistula formation between anastomosis and blake drain, making her back to NPO state and in need of TPN. Family want everything to be done. Palliative care consulted. Currently Full code.  HPI/Subjective: Complaining of mild abdominal pain.   Assessment/Plan: C. difficile colitis C diff positive on 4-12.  Received 4 days of flagyl without improvement.  Will count as day 8/14 for oral vanc Diarrhea almost resolved.   Acute hypoxic respiratory failure secondary to H1 N1 influenza +/- HCAP -Has completed antibiotics.  -No accessory muscles use; requiring 2 L of oxygen  -will continue use pulmicort and continue using flutter valve/pulmonary hygiene and xopenex -CT angio negative for PE.   PSVT/concerns for pulmonary embolism given extremely high heart right side pressure -Has been evaluated by EP - TSH is slightly elevated; will check Free T4 and T3 -Cardiology following - follow K+ and Mg  -Continue with metoprolol  and amiodarone; cardiology adjusting medications.  -2-D echo to be repeated, with concerns for pericardial rub -Echo demonstrated extremely elevated heart right side pressure and moderately reduce function of RV -V-Q scan with intermediate probability for pulmonary embolism.  -CTA negative for PE.   Small PFO Has been evaluated by Cardiology No need for closure currently  ACA CVA with Lt sided weakness Will need ongoing long term rehab - PT/OT to cont to follow while inpatient Patient remains very weak and deconditioned .   ESRD new start HD this admit  Ongoing hemodialysis per Nephrology - perm-cath placed 3/26 d/t AVF problems Electrolytes abnormalities corrected with dialysis.  Patient to have AV fistula reviewed at some point for permanent access (most likely in outpatient setting) .  Patient will need to be able to sit up for dialysis.   Status post exploratory laparotomy with closure of perforated pyloric ulcer/abdominal abscess / small bowel resection w/ postop ileus and now with fistula General Surgery continues to follow Due to Fistula is back to NPO state and with plans to pursuit temporary TPN if decision is for aggressive management; vs palliative care and comfort feeding  Pelvic abscess status post percutaneous drain placement 4/8; now with leakage from anastomosis site -Ongoing care per general surgery -Received  Zosyn for 23 days. IV antibiotics discontinue by Surgery.  -Received  18 days of vancomycin and  18 days of fluconazole.  -Body fluid grew lactobacillus species.  -Will follow recommendations from [facility]; Patient with leakage from anastomosis site needs to be NPO and back to TPN. -  Palliative care consulted, awaiting further conversations.   Chronic diastolic congestive heart failure w/ Severe RHF EF 60-65%, grade 1 diastolic dysfunction. Appears compensated.  Fluids/volume manage with hemodialysis.  Concerns for PE on 2-D echo; CTA negative for PE>   Anemia  of critical illness and chronic disease Hgb presently stable s/p 2U PRBC 4/13 - follow  HB remains stable.  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 TPN wil be started today.  Holding SSI at this moment; will need re-initiation once on TNA again  Abnormal TSH -Free T4 WNL -T3 1.8 -most likely sick thyroid with ongoing infection and body stress; also due to amiodarone use. TSH in 7 range -will continue low dose synthroid given PSVT  Obesity - Inadequate oral intake related to altered GI function as evidenced by limited intake, ongoing.  Body mass index is 30.52 kg/(m^2). -patient weight trending down -patient unable to tolerate diet and with positive leaking fistula  Code Status: FULL Family Communication: discussed with patient.  Disposition Plan: Will need long-term rehabilitation at SNF; once able to tolerate HD on recliner and with improve nutrition; last one very dificult as patient with positive leaking fistula and in need of TPN.    Consultants: Cardiology Nephrology PCCM Gen Surgery  Palliative care  Significant Events: 3/16 influenza A+ 3/18 R ACA stroke 3/19 MRI multifocal acute infarction of both hemispheres, large right distal ACA 3/22 TEE severe RAE, severely reduced RV function, severe TR, no LAA thrombus, small PFO on TEE 3/31 to OR ex lap for per pyloric ulcer and sm bowel resection 4/1 septic shock on pressors 4/3 pressors weaned off  4/4 restart pressors, CRRT 4/6 levo switched to neo 4/7 pelvic abscess on CT 4/8 IR placed pelvic drain 4/9 off pressors 4/11 long run of SVT. 4/23 VQ scan which  demonstrated intermediate probability for PE 4/24 CT angiogram of her chest: Pending/unable to be done due to lack of access   Antibiotics: Zosyn >stopped on 4/22 (received a total of 23 days of treatment ) Vancomycin 4-16  DVT prophylaxis: SCDs  Objective: Blood pressure 122/77, pulse 99, temperature 97.8 F (36.6 C), temperature source Oral, resp. rate 15, height  (1.676 m), weight 85.73 kg (189 lb), SpO2 96 %.  Intake/Output Summary (Last 24 hours) at 07/31/14 1430 Last data filed at 07/31/14 0600  Gross per 24 hour  Intake 1760.34 ml  Output   2230 ml  Net -469.66 ml   Exam: General: NAD.  Lungs: scattered rhonchi, mild expiratory wheezes and fair air movement.  Cardiovascular: Regular rate, no murmurs and no gallops   Abdomen: Abdominal wound dressing, clean and dry; abdomen slightly distended, drain intact and with /greenigh drainage; patient reports mild pain with palpation;  Extremities: No significant cyanosis, clubbing, edema bilateral lower extremities  Data Reviewed: Basic Metabolic Panel:  Recent Labs Lab 07/27/14 1215 07/29/14 0415 07/30/14 0505 07/31/14 1050  NA 137 134* 138 137  K 4.4 4.0 3.3* 3.0*  CL 93* 94* 99 94*  CO2 GLUCOSE 93 55* 138* 131*  BUN 29* 29* 14 36*  CREATININE 7.02* 6.65* 5.20* 8.44*  CALCIUM 8.5 8.4 8.6 8.8  MG  --  2.0 2.0  --   PHOS  --  6.6* 3.6  --     Liver Function Tests:  Recent Labs Lab 07/29/14 0415 07/30/14 0505  AST 41* 36  ALT 18 20  ALKPHOS 137* 146*  BILITOT 1.2 0.8  PROT 6.2 7.7  ALBUMIN 1.8* 2.1*   CBC:  Recent Labs Lab 07/27/14 1215 07/29/14 2047 07/30/14 0505  WBC 11.4* 11.1* 11.1*  NEUTROABS  --  8.0*  --   HGB 9.3* 9.7* 11.1*  HCT 31.0* 31.4* 35.3*  MCV 93.4 92.6 92.2  PLT 288 342 420*    CBG:  Recent Labs Lab 07/30/14 1553 07/30/14 2359 07/31/14 0620 07/31/14 0818 07/31/14 1230  GLUCAP 123* 140* 161* 121* 151*    Scheduled Meds:  Scheduled Meds: .  amiodarone  200 mg Oral BID  . budesonide (PULMICORT) nebulizer solution  0.25 mg Nebulization BID  . darbepoetin (ARANESP) injection - DIALYSIS  150 mcg Intravenous Q Mon-HD  . insulin aspart  0-9 Units Subcutaneous 4 times per day  . levothyroxine  25 mcg Oral QAC breakfast  . metoprolol tartrate  25 mg Oral BID  . midodrine  10 mg Oral BID WC  . [START ON 08/01/2014] pneumococcal 23 valent vaccine  0.5 mL Intramuscular Tomorrow-1000  . potassium chloride  10 mEq Intravenous Q1 Hr x 3  . sodium chloride  10-40 mL Intracatheter Q12H  . vancomycin  125 mg Oral 4 times per day    Time spent on care of this patient: 35 minutes   Alba Cory , MD  [phone] [facility] Office  [phone] Pager - Text Page per Loretha Stapler as per below:  On-Call/Text Page:      [email or web address]      password [email or web address]  If 7PM-7AM, please contact night-coverage [email or web address] Password [email or web address] 07/31/2014, 2:30 PM   LOS: 44 days

## 2014-08-01 LAB — RENAL FUNCTION PANEL
ANION GAP: 12 (ref 5–15)
Albumin: 2 g/dL — ABNORMAL LOW (ref 3.5–5.2)
BUN: 23 mg/dL (ref 6–23)
CALCIUM: 8.8 mg/dL (ref 8.4–10.5)
CO2: 30 mmol/L (ref 19–32)
Chloride: 95 mmol/L — ABNORMAL LOW (ref 96–112)
Creatinine, Ser: 4.79 mg/dL — ABNORMAL HIGH (ref 0.50–1.10)
GFR, EST AFRICAN AMERICAN: 11 mL/min — AB (ref >90)
GFR, EST NON AFRICAN AMERICAN: 9 mL/min — AB (ref >90)
GLUCOSE: 183 mg/dL — AB (ref 70–99)
PHOSPHORUS: 1.2 mg/dL — AB (ref 2.3–4.6)
POTASSIUM: 2.8 mmol/L — AB (ref 3.5–5.1)
SODIUM: 137 mmol/L (ref 135–145)

## 2014-08-01 LAB — CBC
HCT: 35.4 % — ABNORMAL LOW (ref 36.0–46.0)
Hemoglobin: 10.6 g/dL — ABNORMAL LOW (ref 12.0–15.0)
MCH: 28.2 pg (ref 26.0–34.0)
MCHC: 29.9 g/dL — ABNORMAL LOW (ref 30.0–36.0)
MCV: 94.1 fL (ref 78.0–100.0)
PLATELETS: 307 10*3/uL (ref 150–400)
RBC: 3.76 MIL/uL — ABNORMAL LOW (ref 3.87–5.11)
RDW: 24.5 % — ABNORMAL HIGH (ref 11.5–15.5)
WBC: 13.1 10*3/uL — ABNORMAL HIGH (ref 4.0–10.5)

## 2014-08-01 LAB — GLUCOSE, CAPILLARY
Glucose-Capillary: 128 mg/dL — ABNORMAL HIGH (ref 70–99)
Glucose-Capillary: 132 mg/dL — ABNORMAL HIGH (ref 70–99)
Glucose-Capillary: 148 mg/dL — ABNORMAL HIGH (ref 70–99)
Glucose-Capillary: 157 mg/dL — ABNORMAL HIGH (ref 70–99)

## 2014-08-01 LAB — MAGNESIUM: Magnesium: 1.9 mg/dL (ref 1.5–2.5)

## 2014-08-01 MED ORDER — FAT EMULSION 20 % IV EMUL
240.0000 mL | INTRAVENOUS | Status: AC
  Administered 2014-08-01: 240 mL via INTRAVENOUS
  Filled 2014-08-01: qty 250

## 2014-08-01 MED ORDER — POTASSIUM CHLORIDE 10 MEQ/50ML IV SOLN
10.0000 meq | INTRAVENOUS | Status: AC
  Administered 2014-08-01 (×3): 10 meq via INTRAVENOUS
  Filled 2014-08-01 (×3): qty 50

## 2014-08-01 MED ORDER — TRACE MINERALS CR-CU-F-FE-I-MN-MO-SE-ZN IV SOLN
INTRAVENOUS | Status: AC
  Administered 2014-08-01: 17:00:00 via INTRAVENOUS
  Filled 2014-08-01: qty 1920

## 2014-08-01 MED ORDER — POTASSIUM PHOSPHATES 15 MMOLE/5ML IV SOLN
25.0000 mmol | Freq: Once | INTRAVENOUS | Status: AC
  Administered 2014-08-01: 25 mmol via INTRAVENOUS
  Filled 2014-08-01: qty 8.33

## 2014-08-01 NOTE — Progress Notes (Signed)
[name], female   DOB: [birth year], 59 y.o.   MRN: [id number]   LOS: 45 days   POD#31  Subjective: Doesn't feel good but no worse than yesterday.   Objective: Vital signs in last 24 hours: Temp:  [97.8 F (36.6 C)-98.2 F (36.8 C)] 98.2 F (36.8 C) (04/30 0627) Pulse Rate:  [59-113] 109 (04/30 0627) Resp:  [14-18] 18 (04/30 0627) BP: (104-122)/(57-80) 104/64 mmHg (04/30 0627) SpO2:  [95 %-100 %] 95 % (04/30 0627) Weight:  [86.2 kg (190 lb 0.6 oz)] 86.2 kg (190 lb 0.6 oz) (04/29 2054) Last BM Date: 07/31/14   Drain: 194775ml/24h   Laboratory  CBC  Recent Labs  07/30/14 0505 08/01/14 0538  WBC 11.1* 13.1*  HGB 11.1* 10.6*  HCT 35.3* 35.4*  PLT 420* 307   BMET  Recent Labs  07/31/14 1050 08/01/14 0538  NA 137 137  K 3.0* 2.8*  CL 94* 95*  CO2 30 30  GLUCOSE 131* 183*  BUN 36* 23  CREATININE 8.44* 4.79*  CALCIUM 8.8 8.8    Physical Exam General appearance: alert and no distress Resp: clear to auscultation bilaterally Cardio: regular rate and rhythm GI: normal findings: bowel sounds normal and soft, VAC/drain in place   Assessment/Plan: POD #31 s/p Exploratory Laparotomy with graham patch closure of perforated pyloric ulcer, SBR for ischemic bowel - 07/01/2014 - [name] Intra-abdominal fluid collection, no drain Now with duodenal fistula -CT 07/24/14 showed 6.6x3.4cm pre-rectal fluid collection. no drain was replaced after previous drain accidentally pulled out -Fistula study done and shows JP drain connection to the duodenal bulb. - patient is currently being treated with NPO and TNA as she and her family have wished for aggressive treatment at this point. Her output from her drain has significantly increased. Follow closely. Strict NPO. ? Any role for sandostatin -VAC back on MWF -I&Os and routine drain care VTE prophylaxis-SCDs ID--Zosyn stopped on day #23 (07/24/14) C diff + -- Oral vanc New ESRD  PCM/TNA     Stephanie CaldronMichael J.  Stephanie Hannay, PA-C Pager: [phone] 08/01/2014

## 2014-08-01 NOTE — Progress Notes (Signed)
[facility] Progress Note  Assessment/Plan: 1. New ESRD - on MWF schedule in [place] / R IJ catheter use due to difficulties  With AVF; need to draw as many labs as possible on HD; hypokalemic - K 2.8 - pharmacy not adding K to bags - given hx of dysrhythmias  Pharmacy repleting K with IV suppl K - use 3-4 K bath on HD as warranted - next HD Monday; continue to use recliner to help build tolerance (ran 3.5 of 4 hours Friday) 2. Vol excess -improved. 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 4. Dialysis access - AVF @ LUA placed 01/28/14 by Dr. Darrick Penna, functioned poorly, revision pending as outpatient vs doing revision prior to d/c; using R IJ catheter placed by Dr. Hart Rochester 3/26.  5. Perforated pyloric ulcer / necrotic bowel - s/p repair of perforated ulcer & partial ileum resection 3/30, off Zosyn. On PO Vanco / Per surgery/npo NOW with last CT="showed 6.6x3.4cm pre-rectal fluid collection/ could not rule out gastric leak because contrast did not fill stomach/duodenal fistula; TPN started 6. Recurrent sepsis - with percutaneous drain (placed 4/8) of pelvic fluid; CCS following  7. C diff colitis - on PO Vancomycin 8. Hypotension /right heart failure - on midodrine 9. Anemia - Hgb 10.6  s/p 2 U PRBCs 4/13, on Aranesp 150 mcg on Mon; Fe 5% on 3/18.-check Fe studies on Monday HD 10. Sec HPT - Corr Ca 8.8 P  1.2 , iPTH 338 - not on Hectorol - not on binders for now/ / using 2.0 ca bath - pharmacy repleting P 11. 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 H1N1 Influenza with resp  failure/- resolved from admit - no coughing or sob this am  13. 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 DM type 2 16. Copd/ Asthma- on - pulmicort and continue using flutter valve/pulmonary hygiene and xopenex 17. Goals of care - Palliative Care working with family/pt - still full code; pt reiterated, does not wish to stop HD; needs emotional support  [name], PA-C [facility] Beeper [phone] 08/01/2014,9:36 AM  LOS: 45 days   Subjective:   Tearful . I am thirsty. My mouth is dry. I am calling all morning and no one is coming into check on me. Wants to continue dialysis.  HD in a recliner was hard but she wants to keep trying. (RN states she has been in the room twice this am and will go in again now)  Objective Filed Vitals:   07/31/14 1738 07/31/14 1843 07/31/14 2054 08/01/14 0627  BP: 107/64 110/57 110/80 104/64  Pulse: 111 102 113 109  Temp:  98.1 F (36.7 C) 97.8 F (36.6 C) 98.2 F (36.8 C)  TempSrc:  Oral Oral Oral  Resp: Height:      Weight:   86.2 kg (190 lb 0.6 oz)  SpO2: 100% 100% 99% 95%   Physical Exam General: crying in bed - lying on side Heart:tachy reg Lungs: grossly clear Abdomen: VAC in place Extremities: no LE edema Dialysis Access: left avf +bruit and right IJ  Dialysis Orders:  Additional Objective Labs: Basic Metabolic Panel:  Recent Labs Lab 07/29/14 0415 07/30/14 0505 07/31/14 1050 08/01/14 0538  NA 134* 138 137 137  K 4.0 3.3* 3.0* 2.8*  CL 94* 99 94* 95*  CO2 25 25 30 30   GLUCOSE 55* 138* 131* 183*  BUN 29* 14 36* 23  CREATININE 6.65* 5.20* 8.44* 4.79*  CALCIUM 8.4 8.6 8.8 8.8  PHOS 6.6* 3.6  --  1.2*   Liver Function Tests:  Recent Labs Lab 07/29/14 0415 07/30/14 0505 08/01/14 0538  AST 41* 36  --    ALT 18 20  --   ALKPHOS 137* 146*  --   BILITOT 1.2 0.8  --   PROT 6.2 7.7  --   ALBUMIN 1.8* 2.1* 2.0*   CBC:  Recent Labs Lab 07/27/14 1215 07/29/14 2047 07/30/14 0505 08/01/14 0538  WBC 11.4* 11.1* 11.1* 13.1*  NEUTROABS  --  8.0*  --   --   HGB 9.3* 9.7* 11.1* 10.6*  HCT 31.0* 31.4* 35.3* 35.4*  MCV 93.4 92.6 92.2 94.1  PLT 288 342 420* 307  CBG:  Recent Labs Lab 07/31/14 0818 07/31/14 1230 07/31/14 1816 08/01/14 08/01/14 0620  GLUCAP 121* 151* 158* 128* 148*  Medications: . TPN (CLINIMIX) Adult without lytes 80 mL/hr at 07/31/14 1830   And  . fat emulsion 240 mL (07/31/14 1830)   . amiodarone  200 mg Oral BID  . budesonide (PULMICORT) nebulizer solution  0.25 mg Nebulization BID  . darbepoetin (ARANESP) injection - DIALYSIS  150 mcg Intravenous Q Mon-HD  . insulin aspart  0-9 Units Subcutaneous 4 times per day  . levothyroxine  25 mcg Oral QAC breakfast  . metoprolol tartrate  25 mg Oral BID  . midodrine  10 mg Oral BID WC  . pneumococcal 23 valent vaccine  0.5 mL Intramuscular Tomorrow-1000  . sodium chloride  10-40 mL Intracatheter Q12H  . vancomycin  125 mg Oral 4 times per day

## 2014-08-01 NOTE — Progress Notes (Signed)
Progress Note [facility].   [name] ZOX:[id number] DOB: 12-Aug-1955 DOA: 06/17/2014 PCP: [name], MD  Admit HPI / Brief Narrative: 59 yo female smoker with hx HTN, ESRD on HD (just began this admit), COPD, failed L AV fistula, initially admitted 3/16 with flu and acute hypoxic resp failure. Ultimately tx to [facility] due to need for HD. 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   Patient on 3/31 was taking to the OR for ex lap for per pyloric ulcer and sm bowel resection. Patient develops septic shock on 4-1, she was started on pressors and wean off pressors on 4-3. Subsequently she was diagnosed with pelvic abscess by CT scan perform on 4-7. She underwent pelvic drain placement by IR. Patient also develops C. diff diagnosed 4-12.  C diff was not responding flagyl, and she was started on vancomycin 4-16. She also develops  run of SVT. Cardiology has been helping with management. Course complicated with high RV pressure and concerns for PE; also with fistula formation between anastomosis and blake drain, making her back to NPO state and in need of TPN. Family want everything to be done. Palliative care consulted. Currently Full code.  HPI/Subjective: No significant abdominal pain today.  Diarrhea has resolved.  She wants to continue with medical care.   Assessment/Plan: C. difficile colitis C diff positive on 4-12.  Received 4 days of flagyl without improvement.  Will count as day 9/14 for oral vanc Diarrhea  resolved.   Status post exploratory laparotomy with closure of perforated pyloric ulcer/abdominal abscess / small bowel resection w/ postop ileus and now with fistula General Surgery continues to follow Due to Fistula is back to NPO state , TPN.   Pelvic abscess status post percutaneous drain placement 4/8; now with leakage from anastomosis site -Ongoing care per general surgery -Received  Zosyn for 23 days. IV antibiotics discontinue by Surgery.    -Received  18 days of vancomycin and  18 days of fluconazole.  -Body fluid grew lactobacillus species.  -Will follow recommendations from CCS; -Patient with leakage from anastomosis site needs to be NPO and back to TPN. -Palliative care consulted, awaiting further conversations.  -follow up WBC trend, increasing.   Hypokalemia; replete IV.   Acute hypoxic respiratory failure secondary to H1 N1 influenza +/- HCAP -Has completed antibiotics.  -No accessory muscles use; requiring 2 L of oxygen  -will continue use pulmicort and continue using flutter valve/pulmonary hygiene and xopenex -CT angio negative for PE.   PSVT/concerns for pulmonary embolism given extremely high heart right side pressure -Has been evaluated by EP - TSH is slightly elevated; will check Free T4 and T3 -Cardiology following - follow K+ and Mg  -Continue with metoprolol and amiodarone; cardiology adjusting medications.  -2-D echo to be repeated, with concerns for pericardial rub -Echo demonstrated extremely elevated heart right side pressure and moderately reduce function of RV -V-Q scan with intermediate probability for pulmonary embolism.  -CTA negative for PE.   Small PFO Has been evaluated by Cardiology No need for closure currently  ACA CVA with Lt sided weakness Will need ongoing long term rehab - PT/OT to cont to follow while inpatient Patient remains very weak and deconditioned .   ESRD new start HD this admit  Ongoing hemodialysis per Nephrology - perm-cath placed 3/26 d/t AVF problems Electrolytes abnormalities corrected with dialysis.  Patient to have AV fistula reviewed at some point for permanent access (most likely in outpatient  setting) .  Patient will need to be able to sit up for dialysis.   Chronic diastolic congestive heart failure w/ Severe RHF EF 60-65%, grade 1 diastolic dysfunction. Appears compensated.  Fluids/volume manage with hemodialysis.  Concerns for PE on 2-D echo; CTA  negative for PE>   Anemia of critical illness and chronic disease Hgb presently stable s/p 2U PRBC 4/13 - follow  HB remains stable.  Aranesp/iron per renal discretion   Thrombocytopenia; resolved.  Appears to be associated with sepsis plt count now within normal limits No signs of overt bleeding currently Has had mild positive HIT test during this admission   Asthma/COPD Continue oxygen supplementation as needed Will continue pulmicort Continue as needed xopenex  Diabetes mellitus 2 Hypoglycemia in setting of NPO status. TPN wil be started today.  Holding SSI at this moment; will need re-initiation once on TNA again  Abnormal TSH -Free T4 WNL -T3 1.8 -most likely sick thyroid with ongoing infection and body stress; also due to amiodarone use. TSH in 7 range -will continue low dose synthroid given PSVT  Obesity - Inadequate oral intake related to altered GI function as evidenced by limited intake, ongoing.  Body mass index is 30.69 kg/(m^2). -patient weight trending down -patient unable to tolerate diet and with positive leaking fistula  Code Status: FULL Family Communication: discussed with patient.  Disposition Plan: Will need long-term rehabilitation at SNF; once able to tolerate HD on recliner and with improve nutrition; last one very dificult as patient with positive leaking fistula and in need of TPN.    Consultants: Cardiology Nephrology PCCM Gen Surgery  Palliative care  Significant Events: 3/16 influenza A+ 3/18 R ACA stroke 3/19 MRI multifocal acute infarction of both hemispheres, large right distal ACA 3/22 TEE severe RAE, severely reduced RV function, severe TR, no LAA thrombus, small PFO on TEE 3/31 to OR ex lap for per pyloric ulcer and sm bowel resection 4/1 septic shock on pressors 4/3 pressors weaned off  4/4 restart pressors, CRRT 4/6 levo switched to neo 4/7 pelvic abscess on CT 4/8 IR placed pelvic drain 4/9 off pressors 4/11 long run of  SVT. 4/23 VQ scan which demonstrated intermediate probability for PE 4/24 CT angiogram of her chest: Pending/unable to be done due to lack of access   Antibiotics: Zosyn >stopped on 4/22 (received a total of 23 days of treatment ) Vancomycin 4-16  DVT prophylaxis: SCDs  Objective: Blood pressure 104/64, pulse 109, temperature 98.2 F (36.8 C), temperature source Oral, resp. rate 18, height  (1.676 m), weight 86.2 kg (190 lb 0.6 oz), SpO2 95 %.  Intake/Output Summary (Last 24 hours) at 08/01/14 1254 Last data filed at 08/01/14 0658  Gross per 24 hour  Intake   1035 ml  Output   3500 ml  Net  -2465 ml   Exam: General: NAD.  Lungs: decrease breath sounds, no wheezing Cardiovascular: Regular rate, no murmurs and no gallops   Abdomen: Abdominal wound dressing, clean and dry; abdomen slightly distended, drain intact and with /greenigh drainage; patient reports mild pain with palpation;  Extremities: No significant cyanosis, clubbing, edema bilateral lower extremities  Data Reviewed: Basic Metabolic Panel:  Recent Labs Lab 07/27/14 1215 07/29/14 0415 07/30/14 0505 07/31/14 1050 08/01/14 0538  NA 137 134* 138 137 137  K 4.4 4.0 3.3* 3.0* 2.8*  CL 93* 94* 99 94* 95*  CO2 GLUCOSE 93 55* 138* 131* 183*  BUN 29* 29* 14 36*  23  CREATININE 7.02* 6.65* 5.20* 8.44* 4.79*  CALCIUM 8.5 8.4 8.6 8.8 8.8  MG  --  2.0 2.0  --  1.9  PHOS  --  6.6* 3.6  --  1.2*    Liver Function Tests:  Recent Labs Lab 07/29/14 0415 07/30/14 0505 08/01/14 0538  AST 41* 36  --   ALT 18 20  --   ALKPHOS 137* 146*  --   BILITOT 1.2 0.8  --   PROT 6.2 7.7  --   ALBUMIN 1.8* 2.1* 2.0*   CBC:  Recent Labs Lab 07/27/14 1215 07/29/14 2047 07/30/14 0505 08/01/14 0538  WBC 11.4* 11.1* 11.1* 13.1*  NEUTROABS  --  8.0*  --   --   HGB 9.3* 9.7* 11.1* 10.6*  HCT 31.0* 31.4* 35.3* 35.4*  MCV 93.4 92.6 92.2 94.1  PLT 288 342 420* 307    CBG:  Recent Labs Lab  07/31/14 1230 07/31/14 1816 08/01/14 08/01/14 0620 08/01/14 1244  GLUCAP 151* 158* 128* 148* 157*    Scheduled Meds:  Scheduled Meds: . amiodarone  200 mg Oral BID  . budesonide (PULMICORT) nebulizer solution  0.25 mg Nebulization BID  . darbepoetin (ARANESP) injection - DIALYSIS  150 mcg Intravenous Q Mon-HD  . insulin aspart  0-9 Units Subcutaneous 4 times per day  . levothyroxine  25 mcg Oral QAC breakfast  . metoprolol tartrate  25 mg Oral BID  . midodrine  10 mg Oral BID WC  . potassium chloride  10 mEq Intravenous Q1 Hr x 3  . potassium phosphate IVPB (mmol)  25 mmol Intravenous Once  . sodium chloride  10-40 mL Intracatheter Q12H  . vancomycin  125 mg Oral 4 times per day    Time spent on care of this patient: 35 minutes   Alba Coryegalado, Belkys A , MD  [phone] [facility] Office  [phone] Pager - Text Page per Amion as per below:  On-Call/Text Page:      [email or web address]      password [email or web address]  If 7PM-7AM, please contact night-coverage [email or web address] Password [email or web address] 08/01/2014, 12:54 PM   LOS: 45 days

## 2014-08-01 NOTE — Progress Notes (Signed)
PARENTERAL NUTRITION CONSULT NOTE - Follow-up  Pharmacy Consult:  TPN Indication:  Duodenal fistula  No Known Allergies   Patient Measurements: Height: 5\' 6"  (167.6 cm) Weight: 190 lb 0.6 oz (86.2 kg) IBW/kg (Calculated) : 59.3  Adjusted body weight: 67 kg  Vital Signs: Temp: 98.2 F (36.8 C) (04/30 0627) Temp Source: Oral (04/30 0627) BP: 104/64 mmHg (04/30 0627) Pulse Rate: 109 (04/30 0627) Intake/Output from previous day: 04/29 0701 - 04/30 0700 In: 1035 [TPN:1035] Out: 3500 [Drains:1975; Stool:1]  Labs:  Recent Labs  07/29/14 2047 07/30/14 0505 08/01/14 0538  WBC 11.1* 11.1* 13.1*  HGB 9.7* 11.1* 10.6*  HCT 31.4* 35.3* 35.4*  PLT 342 420* 307     Recent Labs  07/30/14 0505 07/31/14 1050 08/01/14 0538  NA 138 137 137  K 3.3* 3.0* 2.8*  CL 99 94* 95*  CO2 25 30 30   GLUCOSE 138* 131* 183*  BUN 14 36* 23  CREATININE 5.20* 8.44* 4.79*  CALCIUM 8.6 8.8 8.8  MG 2.0  --   --   PHOS 3.6  --  1.2*  PROT 7.7  --   --   ALBUMIN 2.1*  --  2.0*  AST 36  --   --   ALT 20  --   --   ALKPHOS 146*  --   --   BILITOT 0.8  --   --    Estimated Creatinine Clearance: 14.2 mL/min (by C-G formula based on Cr of 4.79).    Recent Labs  07/31/14 1816 08/01/14 08/01/14 0620  GLUCAP 158* 128* 148*   Insulin Requirements in the past 24 hours:  4 units Novolog SSI  Assessment: 17 YOF with complicated hospital course. S/p closure perforated pyloric ulcer, SBR for ischemic necrosis, diffuse peritonitis on 07/01/14. Pharmacy consulted to provide TPN 4/1-4/15 for nutrition support for prolonged ileus. Pt then transitioned to po diet + supplements. 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  GI: Duodenal fistula. NPO. On TPN. Drain o/p 1975 ml/24h. Albumin 2.1. Prealbumin remains low at 9. Noted palliative care discussions ongoing but for now pt/family wish for aggressive care.  Endo: h/o DM. CBGs well controlled thus far on TPN and minimal SSI,  had low CBGs prior to TPN start.   Lytes: Will plan for none in TPN since pt on iHD. K 2.8 (goal >/=4 with afib), Phos 1.2, Mg 1.9 (goal >/= 2 with afib) Ca 8.8, CoCa 10.32 Renal: New ESRD - started 3/17. HD on M/W/F. Pt discontinued treatment yesterday 30 min early as she was uncomfortable.  Pulm: asthma, COPD. 2L [place]. CT negative for PE.  Cards: HTN. BP stable. HR 59-113 (afib). Cards following. Midodrine, metoprolol, amio. CHADS-VASc =4 (not good candidate for [facility]).  Hepatotobil: Alk phos elevated, other LFTs wnl. TG 178  Neuro: hx depression / insomnia - new stroke noted on 3/18.   ID: Pt continues on oral vanc D#9/14 for cdiff colitis. Diarrhea is better.  Best Practices: SCDs, PPI IV  TPN Access: PICC line in IR 4/27  TPN start date: 4/1->4/15; restart 4/27>>  Current Nutrition:  NPO Clinimix 5/15 (no electrolytes) at 68ml/hr and 20% IVFE at 12ml/hr which will provide 1843 kcal and 96 gm protein.  Nutritional Goals: (per RD note 4/28) 2100-2300 kCal, 120-140 grams of protein per day Goal: Clinimix 5/15 at 141ml/hr + 20% IVFE 17ml/hr to provide 2184 kcal and 120 gm protein  Plan:  - Cont Clinimix 5/15 (no electrolytes) at 50ml/hr and  20% IVFE at 97ml/hr  -Will advance to goal as tolerated. -Replete K and phos  KCl 10 mEq IV X 3 and KPhos 25 mmol IV X 1 - Trace elements and MVI daily in TPN.  - Continue sensitive SSI q6h - f/u am labs  Thanks for allowing pharmacy to be a part of this patient's care.  Excell Seltzer, PharmD Clinical Pharmacist, [phone] 08/01/2014 9:39 AM

## 2014-08-02 LAB — RENAL FUNCTION PANEL
Albumin: 2 g/dL — ABNORMAL LOW (ref 3.5–5.0)
Albumin: 2.1 g/dL — ABNORMAL LOW (ref 3.5–5.0)
Anion gap: 12 (ref 5–15)
Anion gap: 16 — ABNORMAL HIGH (ref 5–15)
BUN: 47 mg/dL — AB (ref 6–20)
BUN: 54 mg/dL — ABNORMAL HIGH (ref 6–20)
CHLORIDE: 91 mmol/L — AB (ref 101–111)
CO2: 29 mmol/L (ref 22–32)
CO2: 26 mmol/L (ref 22–32)
CREATININE: 6.5 mg/dL — AB (ref 0.44–1.00)
Calcium: 8.6 mg/dL — ABNORMAL LOW (ref 8.9–10.3)
Calcium: 8.9 mg/dL (ref 8.9–10.3)
Chloride: 91 mmol/L — ABNORMAL LOW (ref 101–111)
Creatinine, Ser: 7.29 mg/dL — ABNORMAL HIGH (ref 0.44–1.00)
GFR calc Af Amer: 7 mL/min — ABNORMAL LOW (ref >60)
GFR calc Af Amer: 6 mL/min — ABNORMAL LOW (ref >60)
GFR calc non Af Amer: 6 mL/min — ABNORMAL LOW (ref >60)
GFR calc non Af Amer: 6 mL/min — ABNORMAL LOW (ref >60)
Glucose, Bld: 180 mg/dL — ABNORMAL HIGH (ref 70–99)
Glucose, Bld: 161 mg/dL — ABNORMAL HIGH (ref 70–99)
Phosphorus: 2.3 mg/dL — ABNORMAL LOW (ref 2.5–4.6)
Phosphorus: 1.7 mg/dL — ABNORMAL LOW (ref 2.5–4.6)
Potassium: 3.2 mmol/L — ABNORMAL LOW (ref 3.5–5.1)
Potassium: 3.5 mmol/L (ref 3.5–5.1)
Sodium: 132 mmol/L — ABNORMAL LOW (ref 135–145)
Sodium: 133 mmol/L — ABNORMAL LOW (ref 135–145)

## 2014-08-02 LAB — CBC
HCT: 35 % — ABNORMAL LOW (ref 36.0–46.0)
HCT: 38.9 % (ref 36.0–46.0)
HEMOGLOBIN: 10.9 g/dL — AB (ref 12.0–15.0)
Hemoglobin: 12.3 g/dL (ref 12.0–15.0)
MCH: 28.7 pg (ref 26.0–34.0)
MCH: 29.1 pg (ref 26.0–34.0)
MCHC: 31.1 g/dL (ref 30.0–36.0)
MCHC: 31.6 g/dL (ref 30.0–36.0)
MCV: 92.1 fL (ref 78.0–100.0)
MCV: 92 fL (ref 78.0–100.0)
Platelets: 277 10*3/uL (ref 150–400)
Platelets: 258 10*3/uL (ref 150–400)
RBC: 3.8 MIL/uL — AB (ref 3.87–5.11)
RBC: 4.23 MIL/uL (ref 3.87–5.11)
RDW: 24 % — ABNORMAL HIGH (ref 11.5–15.5)
RDW: 23.9 % — ABNORMAL HIGH (ref 11.5–15.5)
WBC: 14.4 10*3/uL — ABNORMAL HIGH (ref 4.0–10.5)
WBC: 12.8 10*3/uL — ABNORMAL HIGH (ref 4.0–10.5)

## 2014-08-02 LAB — GLUCOSE, CAPILLARY
Glucose-Capillary: 136 mg/dL — ABNORMAL HIGH (ref 70–99)
Glucose-Capillary: 167 mg/dL — ABNORMAL HIGH (ref 70–99)
Glucose-Capillary: 150 mg/dL — ABNORMAL HIGH (ref 70–99)
Glucose-Capillary: 142 mg/dL — ABNORMAL HIGH (ref 70–99)
Glucose-Capillary: 165 mg/dL — ABNORMAL HIGH (ref 70–99)

## 2014-08-02 MED ORDER — POTASSIUM CHLORIDE 10 MEQ/50ML IV SOLN
10.0000 meq | INTRAVENOUS | Status: AC
  Administered 2014-08-02 (×3): 10 meq via INTRAVENOUS
  Filled 2014-08-02 (×3): qty 50

## 2014-08-02 MED ORDER — LIDOCAINE-PRILOCAINE 2.5-2.5 % EX CREA: 1.0000 "application " | TOPICAL_CREAM | CUTANEOUS | Status: DC | PRN

## 2014-08-02 MED ORDER — FAT EMULSION 20 % IV EMUL
240.0000 mL | INTRAVENOUS | Status: DC
  Administered 2014-08-02: 240 mL via INTRAVENOUS
  Filled 2014-08-02: qty 250

## 2014-08-02 MED ORDER — PENTAFLUOROPROP-TETRAFLUOROETH EX AERO: 1.0000 "application " | INHALATION_SPRAY | CUTANEOUS | Status: DC | PRN

## 2014-08-02 MED ORDER — ALTEPLASE 2 MG IJ SOLR: 2.0000 mg | Freq: Once | INTRAMUSCULAR | Status: AC | PRN

## 2014-08-02 MED ORDER — SODIUM CHLORIDE 0.9 % IV SOLN: 100.0000 mL | INTRAVENOUS | Status: DC | PRN

## 2014-08-02 MED ORDER — NEPRO/CARBSTEADY PO LIQD: 237.0000 mL | ORAL | Status: DC | PRN

## 2014-08-02 MED ORDER — CLINIMIX/DEXTROSE (5/15) 5 % IV SOLN
INTRAVENOUS | Status: DC
  Administered 2014-08-02: 17:00:00 via INTRAVENOUS
  Filled 2014-08-02: qty 2160

## 2014-08-02 MED ORDER — LIDOCAINE HCL (PF) 1 % IJ SOLN: 5.0000 mL | INTRAMUSCULAR | Status: DC | PRN

## 2014-08-02 MED ORDER — POTASSIUM PHOSPHATES 15 MMOLE/5ML IV SOLN
15.0000 mmol | Freq: Once | INTRAVENOUS | Status: AC
  Administered 2014-08-02: 15 mmol via INTRAVENOUS
  Filled 2014-08-02: qty 5

## 2014-08-02 NOTE — Progress Notes (Signed)
[name], female   DOB: 03/18/1956, 59 y.o.   MRN: [id number]   LOS: 46 days   POD #32  Subjective: Feels a little better than yesterday.   Objective: Vital signs in last 24 hours: Temp:  [97.4 F (36.3 C)-97.7 F (36.5 C)] 97.5 F (36.4 C) (05/01 0500) Pulse Rate:  [92-118] 112 (05/01 1020) Resp:  [15-18] 18 (05/01 0500) BP: (85-115)/(58-79) 104/67 mmHg (05/01 1020) SpO2:  [98 %-100 %] 100 % (05/01 0500) Weight:  [85.9 kg (189 lb 6 oz)] 85.9 kg (189 lb 6 oz) (04/30 2145) Last BM Date: 07/31/14   Drain: 193925ml/24h   Laboratory  CBC  Recent Labs  08/01/14 0538 08/02/14 0518  WBC 13.1* 14.4*  HGB 10.6* 10.9*  HCT 35.4* 35.0*  PLT 307 277   BMET  Recent Labs  08/01/14 0538 08/02/14 0519  NA 137 132*  K 2.8* 3.2*  CL 95* 91*  CO2 30 29  GLUCOSE 183* 180*  BUN 23 47*  CREATININE 4.79* 6.50*  CALCIUM 8.8 8.6*    Physical Exam General appearance: alert and no distress Resp: clear to auscultation bilaterally Cardio: regular rate and rhythm GI: Soft, diminished BS, VAC in place   Assessment/Plan: POD #32 s/p Exploratory Laparotomy with graham patch closure of perforated pyloric ulcer, SBR for ischemic bowel - 07/01/2014 - Derrell LollingIngram Intra-abdominal fluid collection, no drain Now with duodenal fistula -CT 07/24/14 showed 6.6x3.4cm pre-rectal fluid collection. no drain was replaced after previous drain accidentally pulled out -Fistula study done and shows JP drain connection to the duodenal bulb. - patient is currently being treated with NPO and TNA as she and her family have wished for aggressive treatment at this point. Her output from her drain has significantly increased. Follow closely. Strict NPO. ? Any role for sandostatin -VAC back on MWF -I&Os and routine drain care VTE prophylaxis-SCDs ID--Zosyn stopped on day #23 (07/24/14). Primary MD wondering about leukocytosis, need for repeat CT. Afebrile. Will d/w MD. C diff + -- Oral  vanc New ESRD  PCM/TNA     Freeman CaldronMichael J. Robt Okuda, PA-C Pager: [phone] General Trauma PA Pager: [phone]  08/02/2014

## 2014-08-02 NOTE — Progress Notes (Signed)
[name] Progress Note  Assessment/Plan: 1. [place] catheter use due to difficultiesw/AVF need to draw as many labs as possible on HD; hypokalemic - K 2.8 corrected to 3.2- pharmacy dosing runs of IV K - given hx of dysrhythmias Use 4 K  bath on HD as much as possible since 0 K in TPN- next HD Monday; continue to use recliner to help build tolerance (ran 3.5 of 4 hours Friday) 2. Vol excess -improved. UF 2L 4/25, 1.9 4/27 and 1.5 4/29 on Midodrine/high volume with TPN  3 PSVT/concerns for pulmonary embolism given extremely high heart right side pressure/-Small PFO- ct chest = no PE, No Pulmonary Edema,Emphysema,CM no acute findings/ on amio- perrdiology likely PAT vs PJRT - Echo =no pericardial effusion/Mod-severe TR, normal LV systolic fx, severe R heart and severe pul HTN .On MTP 25 bid 4. Dialysis access - AVF @ LUA placed 01/28/14 by Dr. Darrick PennaFields, functioned poorly, revision pending as outpatient vs doing revision prior to d/c; using R IJ catheter placed by Dr. Hart RochesterLawson 3/26.  5. S/p Exp lap for perforated pyloric ulcer/SBR for isch bowel now with duodenal fistula - VAC/TPN off Zosyn 4/22 6. Recurrent sepsis - with percutaneous drain (placed 4/8) of pelvic fluid; CCS following  7. C diff colitis - on PO Vancomycin 8. Hypotension /right heart failure - on midodrine 9. Anemia - Hgb 10.9 s/p 2 U PRBCs 4/13, on Aranesp 150 mcg on Mon; Fe 5% on 3/18.-check Fe studies on Monday HD-  10. Sec HPT -Ca 8.6 P 2.3 , iPTH 338 - not on Hectorol - pharmacy repleting Pprn 11. 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 H1N1 Influenza with resp failure/- resolved  13. 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 DM type 2 16. Copd/ Asthma- on - pulmicort and  continue using flutter valve/pulmonary hygiene and xopenex 17. Goals of care - Palliative Care working with family/pt - still full code; pt reiterated, does not wish to stop HD; needs emotional support  Stephanie SliderMartha B Alysiana Ethridge, PA-C [facility] Bee[facility] [phone] 08/02/2014,8:10 AM  LOS: 46 days   Subjective:   Feeling a little better Objective Filed Vitals:   08/01/14 0627 08/01/14 1639 08/01/14 2145 08/02/14 0500  BP: 104/64 115/73 108/79 85/58  Pulse: 109 107 118 92  Temp: 98.2 F (36.8 C) 97.7 F (36.5 C) 97.4 F (36.3 C) 97.5 F (36.4 C)  TempSrc: Oral Oral Oral Oral  Resp: 18 15 18 18   Height:      Weight:   85.9 kg (189 lb 6 oz)   SpO2: 95% 98% 100% 100%   Physical Exam General: NAD, spirits a little better today Heart: RRR Lungs: no rales Abdomen: soft VAC mid line Extremities: no LE edema Dialysis Access: right IJ and left AVF + bruit  Dialysis Orders: new start, on MWF schedule 85.2 post HD 4/28 am  Additional Objective Labs: Basic Metabolic Panel:  Recent Labs Lab 07/30/14 0505 07/31/14 1050 08/01/14 0538 08/02/14 0519  NA 138 137 137 132*  K 3.3* 3.0* 2.8* 3.2*  CL 99 94* 95* 91*  CO2 25 30 30 29   GLUCOSE 138* 131* 183* 180*  BUN 14 36* 23 47*  CREATININE 5.20* 8.44* 4.79* 6.50*  CALCIUM 8.6 8.8 8.8 8.6*  PHOS 3.6  --  1.2* 2.3*   Liver Function Tests:  Recent Labs Lab 07/29/14 0415 07/30/14 0505 08/01/14 0538 08/02/14 0519  AST 41* 36  --   --   ALT 18 20  --   --   ALKPHOS 137* 146*  --   --   BILITOT 1.2 0.8  --   --   PROT 6.2 7.7  --   --   ALBUMIN 1.8* 2.1* 2.0* 2.0*   CBC:  Recent Labs Lab 07/27/14 1215 07/29/14 2047 07/30/14 0505 08/01/14 0538 08/02/14 0518  WBC 11.4* 11.1* 11.1* 13.1* 14.4*  NEUTROABS  --  8.0*  --   --   --   HGB 9.3* 9.7* 11.1* 10.6* 10.9*  HCT 31.0* 31.4* 35.3* 35.4* 35.0*  MCV 93.4 92.6 92.2 94.1 92.1  PLT 288 342 420* 307 277  CBG:  Recent Labs Lab 08/01/14 08/01/14 0620  08/01/14 1244 08/01/14 1638 08/02/14 0007  GLUCAP 128* 148* 157* 132* 136*   Medications: . TPN (CLINIMIX) Adult without lytes 80 mL/hr at 08/01/14 1725   And  . fat emulsion 240 mL (08/01/14 1725)  . TPN (CLINIMIX) Adult without lytes     And  . fat emulsion     . amiodarone  200 mg Oral BID  . budesonide (PULMICORT) nebulizer solution  0.25 mg Nebulization BID  . darbepoetin (ARANESP) injection - DIALYSIS  150 mcg Intravenous Q Mon-HD  . insulin aspart  0-9 Units Subcutaneous 4 times per day  . levothyroxine  25 mcg Oral QAC breakfast  . metoprolol tartrate  25 mg Oral BID  . midodrine  10 mg Oral BID WC  . potassium chloride  10 mEq Intravenous Q1 Hr x 3  . potassium phosphate IVPB (mmol)  15 mmol Intravenous Once  . sodium chloride  10-40 mL Intracatheter Q12H  . vancomycin  125 mg Oral 4 times per day

## 2014-08-02 NOTE — Progress Notes (Signed)
Occupational Therapy Treatment Patient Details Name: [name] MRN: [id number] DOB: 05/23/1955 Today's Date: 08/02/2014    History of present illness [name] is a 59 y.o. female with a history of CKD and hypertension [facility]16 complaining of shortness of breath and generalized weakness x 2 weeks. Admitted with acute hypoxemic respiratory failure. During hospital stay noted to have weakness of her left side. 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  She developed septic shock 4/1 with pressors weaned off 4/3.  Began CRRT and pressors restarted 4/4; Developed pelvic abcess 4/7 with pelvic drain placed 4/8.  weaned off pressors 4/9.  Runs of SVT 4/5 and 4/11   OT comments  Focus of session on B UE exercise against gravity, bed mobility and sitting balance/endurance at EOB.  Pt cooperative, but with poor eye contact and flat affect stating she prays, but feels her prayers are unanswered. Offered to call the chaplain, but pt refused. Improvement noted in strength and functional use of L UE. Pt states she likes word searches and is tired of TV. Will work toward finding books for pt. Goals updated.  Follow Up Recommendations  SNF;Supervision/Assistance - 24 hour    Equipment Recommendations       Recommendations for Other Services      Precautions / Restrictions Precautions Precautions: Fall Precaution Comments: JP drain, VAC dressing to abdomen Restrictions Weight Bearing Restrictions: No       Mobility Bed Mobility Overal bed mobility: Needs Assistance;+2 for physical assistance Bed Mobility: Rolling;Sidelying to Sit;Sit to Sidelying Rolling: Min assist Sidelying to sit: +2 for physical assistance;Max assist     Sit to sidelying: +2 for physical assistance;Max assist General bed mobility comments: rolled and performed side<>sit to  minimize pain in abdomen. Assist for LEs and trunk, used rail with HOB up.  Transfers                      Balance Overall balance assessment: Needs assistance Sitting-balance support: Feet supported Sitting balance-Leahy Scale: Poor Sitting balance - Comments: required B UE support, not able to use UEs functionally and balance simultaneously.                           ADL Overall ADL's : Needs assistance/impaired Eating/Feeding: NPO                                     General ADL Comments: Agreeable to sitting EOB, declining OOB. Pt reports routinely performing grooming with min assist with nursing staff.      Vision                     Perception     Praxis      Cognition   Behavior During Therapy: Flat affect Overall Cognitive Status: Within Functional Limits for tasks assessed                       Extremity/Trunk Assessment               Exercises General Exercises - Upper Extremity Shoulder Flexion: AROM;10 reps;Both;Strengthening;Supine Elbow Flexion: AROM;Both;10 reps;Supine Elbow Extension: AAROM;Left;10 reps;Supine Wrist Flexion: AAROM;Left;10 reps;Supine Wrist Extension: AAROM;Left;10  reps;Supine Digit Composite Flexion: AAROM;Left;10 reps;Supine Composite Extension: AAROM;Left;10 reps;Sidelying   Shoulder Instructions       General Comments      Pertinent Vitals/ Pain       Pain Assessment: Faces Faces Pain Scale: Hurts even more Pain Location: abdomen with bed mobility Pain Descriptors / Indicators: Grimacing;Guarding;Sore Pain Intervention(s): Limited activity within patient's tolerance;Monitored during session;Repositioned  Home Living                                          Prior Functioning/Environment              Frequency Min 2X/week     Progress Toward Goals  OT Goals(current goals can now be found in the care plan section)  Progress towards OT  goals: Progressing toward goals  Acute Rehab OT Goals Patient Stated Goal: to get better  Time For Goal Achievement: 08/16/14 Potential to Achieve Goals: Fair ADL Goals Pt Will Perform Grooming: with set-up;sitting (in supported sitting) Pt/caregiver will Perform Home Exercise Program: Increased strength;Both right and left upper extremity;With Supervision (with one pound weight, 10 reps) Additional ADL Goal #1: Pt will sit EOB x 10 minutes with min guard assist in preparation for ADL at EOB. Additional ADL Goal #2: Pt will use L UE as a gross assist with bed mobility and ADL requiring B UE use. Additional ADL Goal #3: Pt will perform bed mobility with +2 min assist in preparation for ADL.  Plan Discharge plan remains appropriate    Co-evaluation                 End of Session     Activity Tolerance Patient limited by fatigue   Patient Left in bed;with call bell/phone within reach   Nurse Communication          Time: 1610-9604 OT Time Calculation (min): 26 min  Charges: OT General Charges $OT Visit: 1 Procedure OT Treatments $Therapeutic Activity: 8-22 mins $Therapeutic Exercise: 8-22 mins  Evern Bio 08/02/2014, 3:57 PM [phone]

## 2014-08-02 NOTE — Progress Notes (Signed)
PARENTERAL NUTRITION CONSULT NOTE - Follow-up  Pharmacy Consult:  TPN Indication:  Duodenal fistula  No Known Allergies   Patient Measurements: Height: _0  (167.6 cm) Weight: 189 lb 6 oz (85.9 kg) IBW/kg (Calculated) : 59.3  Adjusted body weight: 67 kg  Vital Signs: Temp: 97.5 F (36.4 C) (05/01 0500) Temp Source: Oral (05/01 0500) BP: 85/58 mmHg (05/01 0500) Pulse Rate: 92 (05/01 0500) Intake/Output from previous day: 04/30 0701 - 05/01 0700 In: 1192 [P.O.:50; TPN:1142] Out: 1925 [Drains:1925]  Labs:  Recent Labs  08/01/14 0538 08/02/14 0518  WBC 13.1* 14.4*  HGB 10.6* 10.9*  HCT 35.4* 35.0*  PLT 307 277     Recent Labs  07/31/14 1050 08/01/14 0538 08/02/14 0519  NA 137 137 132*  K 3.0* 2.8* 3.2*  CL 94* 95* 91*  CO2 _1 GLUCOSE 131* 183* 180*  BUN 36* 23 47*  CREATININE 8.44* 4.79* 6.50*  CALCIUM 8.8 8.8 8.6*  MG  --  1.9  --   PHOS  --  1.2* 2.3*  ALBUMIN  --  2.0* 2.0*   Estimated Creatinine Clearance: 10.4 mL/min (by C-G formula based on Cr of 6.5).    Recent Labs  08/01/14 1244 08/01/14 1638 08/02/14 0007  GLUCAP 157* 132* 136*   Insulin Requirements in the past 24 hours:  5 units Novolog SSI  Assessment: 66 YOF with complicated hospital course. S/p closure perforated pyloric ulcer, SBR for ischemic necrosis, diffuse peritonitis on 07/01/14. Pharmacy consulted to provide TPN 4/1-4/15 for nutrition support for prolonged ileus. Pt then transitioned to po diet + supplements. 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  GI: Duodenal fistula. NPO. On TPN. Drain o/p 1925 ml/24h. Albumin 2.0. Prealbumin remains low at 9. Noted palliative care discussions ongoing but for now pt/family wish for aggressive care.  Endo: h/o DM. CBGs well controlled thus far on TPN and minimal SSI, had low CBGs prior to TPN start.   Lytes: Will plan for none in TPN since pt on iHD. K 3.2 (goal >/=4 with afib), Phos 2.3, Mg 1.9 on  4/30 (goal >/= 2 with afib) Ca 8.6 CoCa 10.2 Renal: New ESRD - started 3/17. HD on M/W/F. Pt discontinued treatment on 4/29 early as she was uncomfortable.  Pulm: asthma, COPD. 2L [place]. CT negative for PE.  Cards: HTN. BP soft HR 92-118 (afib). Cards following. Midodrine, metoprolol, amio. CHADS-VASc =4 (not good candidate for [facility]).  Hepatotobil: Alk phos elevated, other LFTs wnl. TG 178  Neuro: hx depression / insomnia - new stroke noted on 3/18.   ID: Pt continues on oral vanc D#10/14 for cdiff colitis. Diarrhea is better.  Best Practices: SCDs, PPI IV  TPN Access: PICC line in IR 4/27  TPN start date: 4/1->4/15; restart 4/27>>  Current Nutrition:  NPO Clinimix 5/15 (no electrolytes) at 42m/hr and 20% IVFE at 120mhr which will provide 1843 kcal and 96 gm protein.  Nutritional Goals: (per RD note 4/28) 2100-2300 kCal, 120-140 grams of protein per day Goal: Clinimix 5/15 at 10027mr + 20% IVFE 54m40m to provide 2184 kcal and 120 gm protein  Plan:  - Increase Clinimix 5/15 (no electrolytes) to 90ml40mand 20% IVFE at 54ml/57m-Will advance to goal as tolerated. -Replete K and phos  KCl 10 mEq IV X 3 and KPhos 15 mmol IV X 1 - Trace elements and MVI daily in TPN.  - Continue sensitive SSI q6h - f/u am labs  Thanks  for allowing pharmacy to be a part of this patient's care.  Excell Seltzer, PharmD Clinical Pharmacist, [phone] 08/02/2014 7:41 AM

## 2014-08-02 NOTE — Progress Notes (Signed)
Progress Note [facility].   [name] ION:[id number] DOB: February 28, 1956 DOA: 06/17/2014 PCP: Willey BladeEAN, [name], MD  Admit HPI / Brief Narrative28: 58 yo female smoker with hx HTN, ESRD on HD (just began this admit), COPD, failed L AV fistula, initially admitted 3/16 with flu and acute hypoxic resp failure. Ultimately tx to [facility] due to need for HD. 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   Patient on 3/31 was taking to the OR for ex lap for per pyloric ulcer and sm bowel resection. Patient develops septic shock on 4-1, she was started on pressors and wean off pressors on 4-3. Subsequently she was diagnosed with pelvic abscess by CT scan perform on 4-7. She underwent pelvic drain placement by IR. Patient also develops C. diff diagnosed 4-12.  C diff was not responding flagyl, and she was started on vancomycin 4-16. She also develops  run of SVT. Cardiology has been helping with management. Course complicated with high RV pressure and concerns for PE; also with fistula formation between anastomosis and blake drain, making her back to NPO state and in need of TPN. Family want everything to be done. Palliative care consulted. Currently Full code.  HPI/Subjective: No significant abdominal pain today.  Diarrhea has resolved.  No new complaints.   Assessment/Plan: C. difficile colitis C diff positive on 4-12.  Received 4 days of flagyl without improvement.  Will count as day 10/14 for oral vanc Diarrhea  resolved.   Status post exploratory laparotomy with closure of perforated pyloric ulcer/abdominal abscess / small bowel resection w/ postop ileus and now with fistula General Surgery continues to follow Due to Fistula is back to NPO state , TPN.   Pelvic abscess status post percutaneous drain placement 4/8; now with leakage from anastomosis site -Ongoing care per general surgery -Received  Zosyn for 23 days. IV antibiotics discontinue by Surgery.  -Received  18 days of  vancomycin and  18 days of fluconazole.  -Body fluid grew lactobacillus species.  -Will follow recommendations from [facility]; -Patient with leakage from anastomosis site needs to be NPO and back to TPN. -Palliative care consulted, awaiting further conversations.  -follow up WBC trend, increasing. Will ask sx to reevaluate.   Hypokalemia; replete IV.   Acute hypoxic respiratory failure secondary to H1 N1 influenza +/- HCAP -Has completed antibiotics.  -No accessory muscles use; requiring 2 L of oxygen  -will continue use pulmicort and continue using flutter valve/pulmonary hygiene and xopenex -CT angio negative for PE.   PSVT/concerns for pulmonary embolism given extremely high heart right side pressure -Has been evaluated by EP - TSH is slightly elevated; will check Free T4 and T3 -Cardiology following - follow K+ and Mg  -Continue with metoprolol and amiodarone; cardiology adjusting medications.  -2-D echo to be repeated, with concerns for pericardial rub -Echo demonstrated extremely elevated heart right side pressure and moderately reduce function of RV -V-Q scan with intermediate probability for pulmonary embolism.  -CTA negative for PE.   Small PFO Has been evaluated by Cardiology No need for closure currently  ACA CVA with Lt sided weakness Will need ongoing long term rehab - PT/OT to cont to follow while inpatient Patient remains very weak and deconditioned .   ESRD new start HD this admit  Ongoing hemodialysis per Nephrology - perm-cath placed 3/26 d/t AVF problems Electrolytes abnormalities corrected with dialysis.  Patient to have AV fistula reviewed at some point for permanent access (most likely in outpatient  setting) .  Patient will need to be able to sit up for dialysis.   Chronic diastolic congestive heart failure w/ Severe RHF EF 60-65%, grade 1 diastolic dysfunction. Appears compensated.  Fluids/volume manage with hemodialysis.  Concerns for PE on 2-D echo; CTA  negative for PE>   Anemia of critical illness and chronic disease Hgb presently stable s/p 2U PRBC 4/13 - follow  HB remains stable.  Aranesp/iron per renal discretion   Thrombocytopenia; resolved.  Appears to be associated with sepsis plt count now within normal limits No signs of overt bleeding currently Has had mild positive HIT test during this admission   Asthma/COPD Continue oxygen supplementation as needed Will continue pulmicort Continue as needed xopenex  Diabetes mellitus 2 Hypoglycemia in setting of NPO status. TPN wil be started today.  Holding SSI at this moment; will need re-initiation once on TNA again  Abnormal TSH -Free T4 WNL -T3 1.8 -most likely sick thyroid with ongoing infection and body stress; also due to amiodarone use. TSH in 7 range -will continue low dose synthroid given PSVT  Obesity - Inadequate oral intake related to altered GI function as evidenced by limited intake, ongoing.  Body mass index is 30.58 kg/(m^2). -patient weight trending down -patient unable to tolerate diet and with positive leaking fistula  Code Status: FULL Family Communication: discussed with patient.  Disposition Plan: Will need long-term rehabilitation at SNF; once able to tolerate HD on recliner and with improve nutrition; last one very dificult as patient with positive leaking fistula and in need of TPN.    Consultants: Cardiology Nephrology PCCM Gen Surgery  Palliative care  Significant Events: 3/16 influenza A+ 3/18 R ACA stroke 3/19 MRI multifocal acute infarction of both hemispheres, large right distal ACA 3/22 TEE severe RAE, severely reduced RV function, severe TR, no LAA thrombus, small PFO on TEE 3/31 to OR ex lap for per pyloric ulcer and sm bowel resection 4/1 septic shock on pressors 4/3 pressors weaned off  4/4 restart pressors, CRRT 4/6 levo switched to neo 4/7 pelvic abscess on CT 4/8 IR placed pelvic drain 4/9 off pressors 4/11 long run of  SVT. 4/23 VQ scan which demonstrated intermediate probability for PE 4/24 CT angiogram of her chest: Pending/unable to be done due to lack of access   Antibiotics: Zosyn >stopped on 4/22 (received a total of 23 days of treatment ) Vancomycin 4-16  DVT prophylaxis: SCDs  Objective: Blood pressure 104/67, pulse 112, temperature 97.5 F (36.4 C), temperature source Oral, resp. rate 18, height  (1.676 m), weight 85.9 kg (189 lb 6 oz), SpO2 100 %.  Intake/Output Summary (Last 24 hours) at 08/02/14 1502 Last data filed at 08/02/14 0600  Gross per 24 hour  Intake   1192 ml  Output   1925 ml  Net   -733 ml   Exam: General: NAD.  Lungs: decrease breath sounds, no wheezing Cardiovascular: Regular rate, no murmurs and no gallops   Abdomen: Abdominal wound dressing, clean and dry; abdomen slightly distended, drain intact and with /greenigh drainage; patient reports mild pain with palpation;  Extremities: no edema   Data Reviewed: Basic Metabolic Panel:  Recent Labs Lab 07/29/14 0415 07/30/14 0505 07/31/14 1050 08/01/14 0538 08/02/14 0519 08/02/14 1102  NA 134* 138 137 137 132* 133*  K 4.0 3.3* 3.0* 2.8* 3.2* 3.5  CL 94* 99 94* 95* 91* 91*  CO2 GLUCOSE 55* 138* 131* 183* 180* 161*  BUN 29*  14 36* 23 47* 54*  CREATININE 6.65* 5.20* 8.44* 4.79* 6.50* 7.29*  CALCIUM 8.4 8.6 8.8 8.8 8.6* 8.9  MG 2.0 2.0  --  1.9  --   --   PHOS 6.6* 3.6  --  1.2* 2.3* 1.7*    Liver Function Tests:  Recent Labs Lab 07/29/14 0415 07/30/14 0505 08/01/14 0538 08/02/14 0519 08/02/14 1102  AST 41* 36  --   --   --   ALT 18 20  --   --   --   ALKPHOS 137* 146*  --   --   --   BILITOT 1.2 0.8  --   --   --   PROT 6.2 7.7  --   --   --   ALBUMIN 1.8* 2.1* 2.0* 2.0* 2.1*   CBC:  Recent Labs Lab 07/29/14 2047 07/30/14 0505 08/01/14 0538 08/02/14 0518 08/02/14 1102  WBC 11.1* 11.1* 13.1* 14.4* 12.8*  NEUTROABS 8.0*  --   --   --   --   HGB 9.7* 11.1* 10.6*  10.9* 12.3  HCT 31.4* 35.3* 35.4* 35.0* 38.9  MCV 92.6 92.2 94.1 92.1 92.0  PLT 342 420* 307 277 258    CBG:  Recent Labs Lab 08/01/14 1244 08/01/14 1638 08/02/14 0007 08/02/14 0622 08/02/14 1125  GLUCAP 157* 132* 136* 167* 150*    Scheduled Meds:  Scheduled Meds: . amiodarone  200 mg Oral BID  . budesonide (PULMICORT) nebulizer solution  0.25 mg Nebulization BID  . darbepoetin (ARANESP) injection - DIALYSIS  150 mcg Intravenous Q Mon-HD  . insulin aspart  0-9 Units Subcutaneous 4 times per day  . levothyroxine  25 mcg Oral QAC breakfast  . metoprolol tartrate  25 mg Oral BID  . midodrine  10 mg Oral BID WC  . potassium phosphate IVPB (mmol)  15 mmol Intravenous Once  . sodium chloride  10-40 mL Intracatheter Q12H  . vancomycin  125 mg Oral 4 times per day    Time spent on care of this patient: 35 minutes   Alba Cory , MD  [phone] [facility] Office  [phone] Pager - Text Page per Loretha Stapler as per below:  On-Call/Text Page:      [email or web address]      password [email or web address]  If 7PM-7AM, please contact night-coverage [email or web address] Password [email or web address] 08/02/2014, 3:02 PM   LOS: 46 days

## 2014-08-03 ENCOUNTER — Inpatient Hospital Stay (HOSPITAL_COMMUNITY): Payer: Medicaid Other

## 2014-08-03 ENCOUNTER — Encounter (HOSPITAL_COMMUNITY): Payer: Self-pay | Admitting: Radiology

## 2014-08-03 DIAGNOSIS — N186 End stage renal disease: Secondary | ICD-10-CM | POA: Insufficient documentation

## 2014-08-03 DIAGNOSIS — I472 Ventricular tachycardia: Secondary | ICD-10-CM

## 2014-08-03 DIAGNOSIS — J9601 Acute respiratory failure with hypoxia: Secondary | ICD-10-CM

## 2014-08-03 LAB — CBC
HEMATOCRIT: 35 % — AB (ref 36.0–46.0)
HEMATOCRIT: 33.1 % — AB (ref 36.0–46.0)
HEMOGLOBIN: 11.3 g/dL — AB (ref 12.0–15.0)
Hemoglobin: 10.7 g/dL — ABNORMAL LOW (ref 12.0–15.0)
MCH: 29.3 pg (ref 26.0–34.0)
MCH: 29.1 pg (ref 26.0–34.0)
MCHC: 32.3 g/dL (ref 30.0–36.0)
MCHC: 32.3 g/dL (ref 30.0–36.0)
MCV: 90.7 fL (ref 78.0–100.0)
MCV: 89.9 fL (ref 78.0–100.0)
Platelets: 243 10*3/uL (ref 150–400)
Platelets: 121 10*3/uL — ABNORMAL LOW (ref 150–400)
RBC: 3.86 MIL/uL — ABNORMAL LOW (ref 3.87–5.11)
RBC: 3.68 MIL/uL — ABNORMAL LOW (ref 3.87–5.11)
RDW: 23.3 % — AB (ref 11.5–15.5)
RDW: 23.2 % — AB (ref 11.5–15.5)
WBC: 11.2 10*3/uL — AB (ref 4.0–10.5)
WBC: 12.4 10*3/uL — AB (ref 4.0–10.5)

## 2014-08-03 LAB — COMPREHENSIVE METABOLIC PANEL
ALBUMIN: 2 g/dL — AB (ref 3.5–5.0)
ALT: 17 U/L (ref 14–54)
AST: 29 U/L (ref 15–41)
Alkaline Phosphatase: 148 U/L — ABNORMAL HIGH (ref 38–126)
Anion gap: 14 (ref 5–15)
BUN: 59 mg/dL — AB (ref 6–20)
CHLORIDE: 92 mmol/L — AB (ref 101–111)
CO2: 27 mmol/L (ref 22–32)
Calcium: 8.4 mg/dL — ABNORMAL LOW (ref 8.9–10.3)
Creatinine, Ser: 6.38 mg/dL — ABNORMAL HIGH (ref 0.44–1.00)
GFR calc Af Amer: 8 mL/min — ABNORMAL LOW (ref >60)
GFR, EST NON AFRICAN AMERICAN: 6 mL/min — AB (ref >60)
GLUCOSE: 138 mg/dL — AB (ref 70–99)
Potassium: 3.4 mmol/L — ABNORMAL LOW (ref 3.5–5.1)
Sodium: 133 mmol/L — ABNORMAL LOW (ref 135–145)
TOTAL PROTEIN: 7.3 g/dL (ref 6.5–8.1)
Total Bilirubin: 1.1 mg/dL (ref 0.3–1.2)

## 2014-08-03 LAB — DIFFERENTIAL
BASOS PCT: 1 % (ref 0–1)
Basophils Absolute: 0.1 10*3/uL (ref 0.0–0.1)
EOS PCT: 2 % (ref 0–5)
Eosinophils Absolute: 0.2 10*3/uL (ref 0.0–0.7)
LYMPHS ABS: 1.5 10*3/uL (ref 0.7–4.0)
Lymphocytes Relative: 13 % (ref 12–46)
Monocytes Absolute: 1.6 10*3/uL — ABNORMAL HIGH (ref 0.1–1.0)
Monocytes Relative: 14 % — ABNORMAL HIGH (ref 3–12)
Neutro Abs: 7.8 10*3/uL — ABNORMAL HIGH (ref 1.7–7.7)
Neutrophils Relative %: 70 % (ref 43–77)

## 2014-08-03 LAB — LACTIC ACID, PLASMA: Lactic Acid, Venous: 1.3 mmol/L (ref 0.5–2.0)

## 2014-08-03 LAB — GLUCOSE, CAPILLARY
GLUCOSE-CAPILLARY: 151 mg/dL — AB (ref 70–99)
GLUCOSE-CAPILLARY: 150 mg/dL — AB (ref 70–99)
Glucose-Capillary: 143 mg/dL — ABNORMAL HIGH (ref 70–99)
Glucose-Capillary: 151 mg/dL — ABNORMAL HIGH (ref 70–99)

## 2014-08-03 LAB — TRIGLYCERIDES: TRIGLYCERIDES: 177 mg/dL — AB (ref <150)

## 2014-08-03 LAB — CORTISOL: Cortisol, Plasma: 17.1 ug/dL

## 2014-08-03 LAB — PHOSPHORUS: PHOSPHORUS: 1.7 mg/dL — AB (ref 2.5–4.6)

## 2014-08-03 LAB — PREALBUMIN: Prealbumin: 9.8 mg/dL — ABNORMAL LOW (ref 18–38)

## 2014-08-03 LAB — MAGNESIUM: MAGNESIUM: 1.4 mg/dL — AB (ref 1.7–2.4)

## 2014-08-03 MED ORDER — METOPROLOL TARTRATE 12.5 MG HALF TABLET
12.5000 mg | ORAL_TABLET | Freq: Two times a day (BID) | ORAL | Status: DC
  Filled 2014-08-03 (×3): qty 1

## 2014-08-03 MED ORDER — MAGNESIUM SULFATE 2 GM/50ML IV SOLN
2.0000 g | Freq: Once | INTRAVENOUS | Status: DC
  Filled 2014-08-03: qty 50

## 2014-08-03 MED ORDER — MIDODRINE HCL 5 MG PO TABS
10.0000 mg | ORAL_TABLET | Freq: Three times a day (TID) | ORAL | Status: DC
  Filled 2014-08-03 (×5): qty 2

## 2014-08-03 MED ORDER — LORAZEPAM 2 MG/ML IJ SOLN
1.0000 mg | Freq: Once | INTRAMUSCULAR | Status: AC
  Administered 2014-08-03: 1 mg via INTRAVENOUS
  Filled 2014-08-03: qty 1

## 2014-08-03 MED ORDER — DEXTROSE 5 % IV SOLN: INTRAVENOUS | Status: DC

## 2014-08-03 MED ORDER — ALBUMIN HUMAN 25 % IV SOLN
12.5000 g | Freq: Once | INTRAVENOUS | Status: AC
  Filled 2014-08-03: qty 50

## 2014-08-03 MED ORDER — DARBEPOETIN ALFA 100 MCG/0.5ML IJ SOSY
100.0000 ug | PREFILLED_SYRINGE | INTRAMUSCULAR | Status: DC
  Administered 2014-08-03: 100 ug via INTRAVENOUS
  Filled 2014-08-03: qty 0.5

## 2014-08-03 MED ORDER — IPRATROPIUM-ALBUTEROL 0.5-2.5 (3) MG/3ML IN SOLN
RESPIRATORY_TRACT | Status: AC
  Administered 2014-08-03: 15:00:00
  Filled 2014-08-03: qty 3

## 2014-08-03 MED ORDER — HYDROMORPHONE HCL 1 MG/ML IJ SOLN: 0.2000 mg | Freq: Four times a day (QID) | INTRAMUSCULAR | Status: DC

## 2014-08-03 MED ORDER — TRACE MINERALS CR-CU-F-FE-I-MN-MO-SE-ZN IV SOLN
INTRAVENOUS | Status: DC
  Filled 2014-08-03: qty 2400

## 2014-08-03 MED ORDER — SODIUM PHOSPHATE 3 MMOLE/ML IV SOLN
30.0000 mmol | Freq: Once | INTRAVENOUS | Status: DC
  Filled 2014-08-03: qty 10

## 2014-08-03 MED ORDER — TRACE MINERALS CR-CU-F-FE-I-MN-MO-SE-ZN IV SOLN
INTRAVENOUS | Status: AC
  Administered 2014-08-03: 19:00:00 via INTRAVENOUS

## 2014-08-03 MED ORDER — MIDODRINE HCL 5 MG PO TABS
ORAL_TABLET | ORAL | Status: AC
  Filled 2014-08-03: qty 2

## 2014-08-03 MED ORDER — IOHEXOL 300 MG/ML  SOLN
100.0000 mL | Freq: Once | INTRAMUSCULAR | Status: AC | PRN
  Administered 2014-08-03: 100 mL via INTRAVENOUS

## 2014-08-03 MED ORDER — CLINIMIX/DEXTROSE (5/15) 5 % IV SOLN: INTRAVENOUS | Status: DC

## 2014-08-03 MED ORDER — FAT EMULSION 20 % IV EMUL: 240.0000 mL | INTRAVENOUS | Status: DC

## 2014-08-03 MED ORDER — LORAZEPAM 2 MG/ML IJ SOLN
INTRAMUSCULAR | Status: DC
Start: 2014-08-03 — End: 2014-08-03
  Filled 2014-08-03: qty 1

## 2014-08-03 MED ORDER — FAT EMULSION 20 % IV EMUL
240.0000 mL | INTRAVENOUS | Status: AC
  Administered 2014-08-03: 240 mL via INTRAVENOUS

## 2014-08-03 MED ORDER — SODIUM CHLORIDE 0.9 % IV SOLN
INTRAVENOUS | Status: DC
  Administered 2014-08-03 – 2014-09-09 (×3): via INTRAVENOUS

## 2014-08-03 MED ORDER — AMIODARONE HCL 200 MG PO TABS
400.0000 mg | ORAL_TABLET | Freq: Two times a day (BID) | ORAL | Status: DC
  Filled 2014-08-03 (×3): qty 2

## 2014-08-03 MED ORDER — ALBUMIN HUMAN 25 % IV SOLN
INTRAVENOUS | Status: AC
  Filled 2014-08-03: qty 50

## 2014-08-03 MED ORDER — ALBUMIN HUMAN 25 % IV SOLN
INTRAVENOUS | Status: AC
  Administered 2014-08-03: 12.5 g
  Filled 2014-08-03: qty 50

## 2014-08-03 MED ORDER — LORAZEPAM 2 MG/ML IJ SOLN
0.2500 mg | Freq: Once | INTRAMUSCULAR | Status: AC
  Administered 2014-08-05: 0.25 mg via INTRAVENOUS

## 2014-08-03 MED ORDER — FAT EMULSION 20 % IV EMUL
240.0000 mL | INTRAVENOUS | Status: DC
  Filled 2014-08-03: qty 250

## 2014-08-03 MED ORDER — METOPROLOL TARTRATE 1 MG/ML IV SOLN
INTRAVENOUS | Status: AC
  Filled 2014-08-03: qty 5

## 2014-08-03 NOTE — Progress Notes (Signed)
After being cleaned from bedpan, Patient began experiencing SOB and requested a breathing treatment.  Per MD Schertz requested treatment be terminated and to notify rapid response. Rapid response came and assisted. Patient stabilized on bipap and transported to 3S 06.Marland Kitchen..Marland Kitchen

## 2014-08-03 NOTE — Progress Notes (Addendum)
Report given to RN on [facility] [place]. Pt's belongings taken to Pt's room 3S06. Attempted to call pt's daughter; left voicemail to inform her of pt being moved to 3S06.   Lujean AmelKadeesha Breland Elders,RN

## 2014-08-03 NOTE — Significant Event (Signed)
Rapid Response Event Note  Overview:  Called by HD RN per Dr. Arlean HoppingSchertz request to assist with patient in HD with onset resp distress Time Called: 1340 Arrival Time: 1350 Event Type: Respiratory, Cardiac  Initial Focused Assessment:  On arrival Dr. Arlean HoppingSchertz at bedside.  RN Vonshell present rinsing patient from HD machine.  Patient alert warm and dry.  Able to speak - has some increased WOB - bil BS with wheezing bilaterally - some coarse BS - HR up to 154 - regular - denies CP - states she is some SOB - had abdominal pain early this AM - had some nausea meds per HD RN report.  Had episode of hypotension while on HD - removal rate turned down - patient remained with low BP got fluid bolus 250cc NS per report from Dr. Arlean HoppingSchertz - then became SOB.  Also had TNA running at 90 cc hr with lipids at 10 cc - all discontinued with this episode per MD order. Abd soft - VAC dressing intact with minimal amount serousang drainage.  Right side bililary JP  drain with full bulb dk green return.    BP 98/68 with rinse back.  HR regular.  RR 28 with O2sats 93% on 3 liters nasal cannula.   Interventions:  HOB elevated and patient repositioned.  Placed on Bipap 16/8 with 100% O2 bleed per RT.  Duoneb treatment given per order Dr. Arlean HoppingSchertz.  Patient began to relax with decreased WOB.  PCXR done.  Dr. Carmell Austriaegaldo - attending - contacted with update - to bedside.  12 lead EKG done - HR now back to 110's - more patients baseline.  Patient resting - states Bipap has helped.  Lungs more clear at this point.  BP 88/64 HR 114 RR 20 with Bipap O2 sats 100%.  Left subclavian area PICC site assessed ( note states it is in IJ)  - DDI - both ports assessed - one withdraws and flushes well - TNA now infusing - will clarify fluid order - one port unable to withdraw or flush - request to IV team for assessment.    For tx to SDU.  Canary BrimBrandi Ollis NP with PCCM present - update given.  Transferred to 3S06 via bed with Bipap - iv's intact.  NAD.  Handoff to  Ross StoresJohanna RN.     Event Summary: Name of Physician Notified: Dr. Arlean HoppingSchertz at  (on unit asked for RRT)  Name of Consulting Physician Notified: PCCM  called by attending Dr. Carmell Austriaegaldo at    Outcome: Transferred (Comment) (SDU 3S)     Delton PrairieBritt, Lashun Mccants L

## 2014-08-03 NOTE — Progress Notes (Signed)
Attempted to placed NG tube twice without success. Patient stated she did not want NG and refused placement. Explained to patient the need for NG due to possible gastric leak and serious infection. Patient continues to refuse. MD made aware.

## 2014-08-03 NOTE — Progress Notes (Signed)
Patient Name: [name] Date of Encounter: 08/03/2014  Principal Problem:   Acute respiratory failure with hypoxia Active Problems:   Cerebral thrombosis with cerebral infarction   Hypertension   ESRD needing dialysis   Obesity (BMI 30-39.9)   Depression   Left renal mass   Chronic diastolic heart failure   H1N1 influenza   Tobacco use disorder   Renal failure (ARF), acute on chronic   Hyperlipidemia   PSVT (paroxysmal supraventricular tachycardia)   SOB (shortness of breath)   Perforated gastric ulcer   Abnormal TSH   History of intermediate V/Q scan   HIT (heparin-induced thrombocytopenia)   Fistula   Abdominal pain   Palliative care encounter   ESRD (end stage renal disease)   Primary Cardiologist: Dr Anne Fu  EP: Dr Graciela Husbands  Patient Profile: 59 yo female w/ hx HTN, ESRD on HD (just began this admit), COPD, failed L AV fistula, initially admitted 3/16 with flu, acute hypoxic resp failure. CVA 03/18, exp lap for small bowel resection 03/31, septic shock, had SVT and seen by cards 04/05. Problems w/ hypotension, espec during/after HD. Now on midodrine. Cards had signed off, more SVT in HD today and cards to address.   SUBJECTIVE: Pt was feeling bad in HD, describes SOB (but was in SVT much of the time)  Felt heart pounding in epigastric area when she was tachycardic. SOB has improved, she is resting comfortably now except says her back is itching.   OBJECTIVE Filed Vitals:   08/03/14 1403 08/03/14 1410 08/03/14 1507 08/03/14 1545  BP: 96/67  87/66 107/77  Pulse: 149  111   Temp:      TempSrc:      Resp:    18  Height:      Weight:      SpO2:  100%      Intake/Output Summary (Last 24 hours) at 08/03/14 1754 Last data filed at 08/03/14 1339  Gross per 24 hour  Intake      0 ml  Output   1930 ml  Net  -1930 ml   Filed Weights   07/31/14 2054 08/01/14 2145 08/02/14 2156  Weight: 190 lb 0.6 oz (86.2 kg) 189 lb 6 oz (85.9 kg) 179 lb 11.2 oz (81.511 kg)     PHYSICAL EXAM General: Well developed, well nourished, female in no acute distress. Head: Normocephalic, atraumatic.  Neck: Supple without bruits, JVD slightly elevated. Lungs:  Resp regular and unlabored, rales bases, ?slight wheeze. Heart: RRR, S1, S2, no S3, S4, no murmur; no rub. Abdomen: Soft, diffusely tender, non-distended, BS decreased Extremities: No clubbing, cyanosis, no edema.  Neuro: Alert and oriented X 3. Moves all extremities spontaneously. Psych: Normal affect.  LABS: CBC:  Recent Labs  08/03/14 0500 08/03/14 1630  WBC 11.2* 12.4*  NEUTROABS 7.8*  --   HGB 11.3* 10.7*  HCT 35.0* 33.1*  MCV 90.7 89.9  PLT 243 121*   Basic Metabolic Panel: Recent Labs  08/01/14 0538  08/02/14 1102 08/03/14 0500  NA 137  < > 133* 133*  K 2.8*  < > 3.5 3.4*  CL 95*  < > 91* 92*  CO2 30  < > 26 27  GLUCOSE 183*  < > 161* 138*  BUN 23  < > 54* 59*  CREATININE 4.79*  < > 7.29* 6.38*  CALCIUM 8.8  < > 8.9 8.4*  MG 1.9  --   --  1.4*  PHOS 1.2*  < > 1.7* 1.7*  < > =  values in this interval not displayed. Liver Function Tests:  Recent Labs  08/02/14 1102 08/03/14 0500  AST  --  29  ALT  --  17  ALKPHOS  --  148*  BILITOT  --  1.1  PROT  --  7.3  ALBUMIN 2.1* 2.0*   Fasting Lipid Panel:  Recent Labs  08/03/14 0500  TRIG 177*   TELE:  SR, SVT most of the time between 11:30 and 2:15 pm      Radiology/Studies: Dg Abd 1 View  08/03/2014   CLINICAL DATA:  NG tube placement  EXAM: ABDOMEN - 1 VIEW  COMPARISON:  07/13/2014  FINDINGS: NG tube tip is in the proximal stomach. The side port is near the GE junction.  IMPRESSION: NG tube tip in the proximal stomach.   Electronically Signed   By: Charlett NoseKevin  Dover M.D.   On: 08/03/2014 17:18   Dg Chest Port 1v Same Day  08/03/2014   CLINICAL DATA:  59 year old female dialysis patient with severe shortness of breath. Respiratory distress. Initial encounter.  EXAM: PORTABLE CHEST - 1 VIEW SAME DAY  COMPARISON:  Chest CTA  07/29/2014 and earlier.  FINDINGS: Portable AP semi upright view at 1400 hrs. Bilateral IJ approach chest catheters appears stable. Stable lung volumes. Stable cardiomegaly and mediastinal contours. No pneumothorax, pulmonary edema, pleural effusion or consolidation. No acute pulmonary opacity identified.  IMPRESSION: No acute cardiopulmonary abnormality.   Electronically Signed   By: Odessa FlemingH  Hall M.D.   On: 08/03/2014 14:18     Current Medications:  . amiodarone  200 mg Oral BID  . budesonide (PULMICORT) nebulizer solution  0.25 mg Nebulization BID  . darbepoetin (ARANESP) injection - DIALYSIS  100 mcg Intravenous Q Mon-HD  . insulin aspart  0-9 Units Subcutaneous 4 times per day  . levothyroxine  25 mcg Oral QAC breakfast  . LORazepam  0.25 mg Intravenous Once  . magnesium sulfate 1 - 4 g bolus IVPB  2 g Intravenous Once  . metoprolol      . metoprolol tartrate  12.5 mg Oral BID  . midodrine  10 mg Oral TID WC  . sodium chloride  10-40 mL Intracatheter Q12H  . sodium phosphate  Dextrose 5% IVPB  30 mmol Intravenous Once  . vancomycin  125 mg Oral 4 times per day   . sodium chloride      ASSESSMENT AND PLAN:   PSVT (paroxysmal supraventricular tachycardia) - has been on amiodarone 200 mg bid since 04/19.  - She was on metoprolol 25 mg bid and Midodrine 10 mg bid since 04/18, but the metoprolol was held at times. -Today, the metoprolol was decreased to 12.5 mg bid and the midodrine was increased to 10 mg tid.  - She did not get the am dose of metoprolol because in HD, her BP was too low. Then she went into SVT and more problems with BP.  - She had her am dose of proamatine-  Patient has been seen by Odessa FlemingS Klein in consult earlier this admit  Recomm amiodarone for arrhythmia (PAT vs PJRT) I would recomm increasing amiodarone to 400 bid to continue load As bp tolerates would increase metoprolol.   Otherwise, per IM/CCM/Nephrology/CCS She was seen by Palliative Care but wished to remain a full  code and pursue aggressive rx. Principal Problem:   Acute respiratory failure with hypoxia Active Problems:   Cerebral thrombosis with cerebral infarction   Hypertension   ESRD needing dialysis   Obesity (BMI 30-39.9)  Depression   Left renal mass   Chronic diastolic heart failure   H1N1 influenza   Tobacco use disorder   Renal failure (ARF), acute on chronic   Hyperlipidemia   SOB (shortness of breath)   Perforated gastric ulcer   Abnormal TSH   History of intermediate V/Q scan   HIT (heparin-induced thrombocytopenia)   Fistula   Abdominal pain   Palliative care encounter   Signed, Theodore Demark , PA-C 5:54 PM 08/03/2014   Patient seen and examined  I agree with findings as noted by R Barrett above.  I have amended note above to reflect my findings   WIll continue to follow    Dietrich Pates

## 2014-08-03 NOTE — Progress Notes (Signed)
Called to HD re pt in Resp distress.  Placed on BIPAP 16/8 100% Pt tolerating well, RT to monitor

## 2014-08-03 NOTE — Progress Notes (Signed)
Daily Progress Note   Patient Name: [name]       Date: 08/03/2014 DOB: 1955/05/03  Age: 59 y.o. MRN#: [id number] Attending Physician: Alba CoryBelkys A Regalado, MD Primary Care Physician: Willey BladeEAN, [name], MD Admit Date: 06/17/2014  Reason for Consultation/Follow-up: Establishing goals of care and Pain control  Subjective: Feels very tired and wants to nap. Reports she was unable to tolerate HD sitting: too much low back and abdominal pain. She does continue to have pain. Reviewed medication admin - one dose of diluadid 5/1, no doses given for Hydrocodone. Interval Events: 1. Increased drainage from JP drain 2. Recurrent leukocytosis 3. Continues on HD 4. Continues on TPN  Length of Stay: 47 days  Current Medications: Scheduled Meds:   albumin human       amiodarone  200 mg Oral BID   budesonide (PULMICORT) nebulizer solution  0.25 mg Nebulization BID   darbepoetin (ARANESP) injection - DIALYSIS  150 mcg Intravenous Q Mon-HD   insulin aspart  0-9 Units Subcutaneous 4 times per day   levothyroxine  25 mcg Oral QAC breakfast   metoprolol tartrate  25 mg Oral BID   midodrine       midodrine  10 mg Oral BID WC   sodium chloride  10-40 mL Intracatheter Q12H   vancomycin  125 mg Oral 4 times per day    Continuous Infusions:  TPN (CLINIMIX) Adult without lytes 90 mL/hr at 08/02/14 1710   And   fat emulsion 240 mL (08/02/14 1710)    PRN Meds: anticoagulant sodium citrate, bisacodyl, diphenhydrAMINE, fentaNYL (SUBLIMAZE) injection, HYDROcodone-acetaminophen, HYDROmorphone (DILAUDID) injection, ondansetron (ZOFRAN) IV, sodium chloride  Palliative Performance Scale: 30% %  Vital Signs: BP 92/69 mmHg   Pulse 101   Temp(Src) 97.7 F (36.5 C) (Oral)   Resp 17   Ht 5\' 6"  (1.676 m)   Wt 81.511 kg (179 lb 11.2 oz)   BMI 29.02 kg/m2   SpO2 100% SpO2: SpO2: 100 % O2 Device: O2 Device: Nasal Cannula O2 Flow Rate: O2 Flow Rate (L/min): 2 L/min  Intake/output summary:    Intake/Output Summary (Last 24 hours) at 08/03/14 0947 Last data filed at 08/03/14 0710  Gross per 24 hour  Intake      0 ml  Output   1900 ml  Net  -1900 ml   LBM:   Baseline Weight: Weight: 103.3 kg (227 lb 11.8 oz) Most recent weight: Weight: 81.511 kg (179 lb 11.2 oz)  Physical Exam: General: awake, seems alert, no in acute distress but uncomfortable HEENT- C&S clear Cor -2+ radial pulse, RRR Pulm - no increased WOB, no rales or wheezes, no rhonchi Abd - protruberant, absent BS, diffuse tenderness to light palpation. Drain - bandaged. Neuro - awake, speech clear, cognition w/o change from 1 week ago. Unable to move left arm              Additional Data Reviewed: Recent Labs     08/02/14  1102  08/03/14  0500  WBC  12.8*  11.2*  HGB  12.3  11.3*  PLT  258  243  NA  133*  133*  BUN  54*  59*  CREATININE  7.29*  6.38*     Problem List:  Patient Active Problem List   Diagnosis Date Noted   Abdominal pain    Palliative care encounter    HIT (heparin-induced thrombocytopenia) 07/27/2014   Fistula    History of intermediate V/Q scan    Other emphysema  Intra-abdominal abscess    Abnormal TSH    Enteritis due to Clostridium difficile 07/19/2014   Abdominal abscess    Pelvic fluid collection    Perforated gastric ulcer 07/01/2014   SOB (shortness of breath)    PJRT (permanent junctional reciprocating tachycardia)    Paroxysmal atrial tachycardia    PSVT (paroxysmal supraventricular tachycardia)    Stroke    Hyperlipidemia    Cerebral thrombosis with cerebral infarction 06/20/2014   Hypertension 06/20/2014   ESRD needing dialysis 06/20/2014   Obesity (BMI 30-39.9) 06/20/2014   Depression 06/20/2014   Left renal mass 06/20/2014   Chronic diastolic heart failure 06/20/2014   H1N1 influenza 06/20/2014   Tobacco use disorder 06/20/2014   ARF (acute renal failure)    Renal failure (ARF), acute on chronic    Acute respiratory  failure with hypoxia 06/17/2014   End stage renal disease 01/21/2014     Palliative Care Assessment & Plan    Code Status:  Full code  Goals of Care:  Continue present aggressive course of treatment. Mrs. Griffo does not want to discuss or even think about code status or taking a less aggressive course.  Desire for further Chaplaincy support:yes  3. Symptom Management:  Continued pain. Reviewed meds given under manage order tab: has not been getting prn medication. She indicates constant discomfort and pain with movement. Plan - will started scheduled doses of dilaudid 0.2mg  q6 4. Palliative Prophylaxis:  Stool Softner: ordered  5. Prognosis: < 6 months  5. Discharge Planning: undetermined at this time.   Care plan was discussed with patient  Thank you for allowing the Palliative Medicine Team to assist in the care of this patient.   Time In: 0947 Time Out: 1038 Total Time 51 Prolonged Time Billed  no     Greater than 50%  of this time was spent counseling and coordinating care related to the above assessment and plan.   [No Provider Link Found]  08/03/2014, 9:47 AM  Please contact Palliative Medicine Team phone at [phone] for questions and concerns.

## 2014-08-03 NOTE — Progress Notes (Signed)
Name: [name] MRN: [id number] DOB: 1956-03-24    ADMISSION DATE:  06/17/2014 CONSULTATION DATE:  3/30  REFERRING MD :  Mahala MenghiniSamtani   CHIEF COMPLAINT:  Hypoxia   BRIEF PATIENT DESCRIPTION: 59 yo female smoker with hx HTN, ESRD on HD (just began this admit), COPD, failed L AV fistula, initially admitted 3/16 with flu, acute hypoxic resp failure.  [facility] need [facility]  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  Has made little improvement and on 3/30 has worsening hypoxia, generalized pain, hyperkalemia, worsening leukocytosis and PCCM consulted.     SIGNIFICANT EVENTS  3/16  influenza A+ 3/18  R ACA stroke 3/31  to or ex lap for per pyloric ulcer and sm bowel resection 4/01  septic shock on pressors 4/03  pressors weaned off  4/04  restart pressors, CRRT 4/05  run of SVTs 4/06  off levo switched to neo 4/07  pelvic abscess on CT 4/08  IR placed pelvic drain 4/09  off pressors 4/11  long run of SVT 4/12  Tx to SDU & TRH  4/23  VQ scan completed with concerns of high pressures on ECHO 4/24  CTA chest RULED OUT PE 5/02  Abdominal pain early am, hypotension with HD, SVT. Resolved when HD stopped, 250 ml NS bolus given.  PCCM called back for eval.  STUDIES:  3/19  MRI >> multifocal acute infarction of both hemispheres, large right distal ACA 3/22  TEE >> severe RAE, severely reduced RV function, severe TR, no LAA thrombus, small PFO on TEE 3/30  CT chest/abd >> perf bowel 2/2 presumed embolism in setting of ACA, ?rectrovaginal abscess development, LLL collapse 4/04  CT abd/pelvis >> small pleural effusion left, no leaking contrast or SBO findings  4/07  CT abd/pelvis >> increased pelvic abscess growth  4/14  CT Abd/pelvis >> interval reduction of pelvic fluid, stable LUQ surgical drain 4/22  CT Abd/pelvis >> 3.1 cm infrarenal abd aortic aneurysm, surgical drain unchanged, pre-rectal fluid collection in  pelvis increased  4/23  VQ Scan >> intermediate probability for PE 4/27  CTA Chest >> no evidence of PE, no acute findings, mild emphysema, cardiomegaly   SUBJECTIVE:  RN reports patient mid-epigastric pain, feels comfortable on BiPAP.  BP returned to her baseline.     VITAL SIGNS: Temp:  [97.2 F (36.2 C)-97.8 F (36.6 C)] 97.5 F (36.4 C) (05/02 1400) Pulse Rate:  [65-149] 149 (05/02 1403) Resp:  [16-22] 20 (05/02 0917) BP: (74-114)/(53-80) 96/67 mmHg (05/02 1403) SpO2:  [99 %-100 %] 100 % (05/02 1410) FiO2 (%):  [100 %] 100 % (05/02 1410) Weight:  [179 lb 11.2 oz (81.511 kg)] 179 lb 11.2 oz (81.511 kg) (05/01 2156)  PHYSICAL EXAMINATION: General: chronically ill appearing female in NAD on bipap HEENT: PERRL, EOMI, no scleral icterus Cardiac: RRR, no rubs, murmurs or gallops Pulm: even/non-labored on bipap, lungs bilaterally diminished but clear  Abd: soft, nondistended, BS hypoactive, midline VAC c/d/i with brown fluid, JP drain to RUQ with dark brown fluid Ext: warm/dry, no edema  Neuro: alert and oriented X3, follows commands, MAE   CBC Recent Labs     08/02/14  0518  08/02/14  1102  08/03/14  0500  WBC  14.4*  12.8*  11.2*  HGB  10.9*  12.3  11.3*  HCT  35.0*  38.9  35.0*  PLT  277  258  243  Coag's No results for input(s): APTT, INR in the last 72 hours.  BMET Recent Labs     08/02/14  0519  08/02/14  1102  08/03/14  0500  NA  132*  133*  133*  K  3.2*  3.5  3.4*  CL  91*  91*  92*  CO2  BUN  47*  54*  59*  CREATININE  6.50*  7.29*  6.38*  GLUCOSE  180*  161*  138*    Electrolytes Recent Labs     08/01/14  0538  08/02/14  0519  08/02/14  1102  08/03/14  0500  CALCIUM  8.8  8.6*  8.9  8.4*  MG  1.9   --    --   1.4*  PHOS  1.2*  2.3*  1.7*  1.7*    Liver Enzymes Recent Labs     08/02/14  0519  08/02/14  1102  08/03/14  0500  AST   --    --   29  ALT   --    --   17  ALKPHOS   --    --   148*  BILITOT   --    --   1.1    ALBUMIN  2.0*  2.1*  2.0*   Glucose Recent Labs     08/02/14  0622  08/02/14  1125  08/02/14  1726  08/02/14  2202  08/03/14  0614  08/03/14  0800  GLUCAP  167*  150*  142*  165*  151*  151*    Imaging Dg Chest Port 1v Same Day  08/03/2014   CLINICAL DATA:  59 year old female dialysis patient with severe shortness of breath. Respiratory distress. Initial encounter.  EXAM: PORTABLE CHEST - 1 VIEW SAME DAY  COMPARISON:  Chest CTA 07/29/2014 and earlier.  FINDINGS: Portable AP semi upright view at 1400 hrs. Bilateral IJ approach chest catheters appears stable. Stable lung volumes. Stable cardiomegaly and mediastinal contours. No pneumothorax, pulmonary edema, pleural effusion or consolidation. No acute pulmonary opacity identified.  IMPRESSION: No acute cardiopulmonary abnormality.   Electronically Signed   By: Odessa Fleming M.D.   On: 08/03/2014 14:18   ASSESSMENT / PLAN:   PULMONARY ETT 3/31 >> 4/02 A: Dyspnea / Acute Respiratory Distress - 5/2 during HD with SVT  Hx COPD Nocturnal Desaturations - concern for OSA/OHS Acute hypoxic respiratory failure - 2nd to H1N1, hypervolemia, HCAP >> resolved.  PAH P: Now CXR, images reviewed with no acute abnormality  Oxygen to keep SpO2 > 92% Bronchial hygiene Scheduled BD's CPAP QHS & PRN   CARDIAC A: 1. SVT - 5/2, has had runs throughout hospitalization.  Suspect 5/2 events were related to fluid shifts with HD + SVT.  Resolved once HD stopped and patient was given NS bolus.  2. Rule out Adrenal Insufficiency  3. Hypotension - transient 5/2, related to SVT, possibly dry with increase output from drains + HD etc.  Baseline pressures ~ 100 Systolic 4. Chronic dCHF - EF 60-65%, grade 1 diastolic dysfunction. 5. Small PFO P: Amidoarone 200 QD Continue lopressor, reduce dose to 12.5 BID.  Consider resume 25 mg BID in am 5/3 Continue Midodrine, increase to TID  Assess lactic acid, cortisol now Consider Card's re-consult for rate  issues.    GASTROENTEROLOGY A: Pyloric ulcer perforation s/p laparotomy with VAC placement  Protein Calorie Malnutrition  Pelvic Fluid Collection - drain removed, increased on CT  P: Post  op care per CCS TPN per [facility]CS Continue protonix S/P albumin 5/2   RENAL: A: ESRD - new start HD this admit, intermittent HD started 4/12  Hypokalemia  Hypomagnesemia  Hypophos P: Trend BMP  Replace electrolytes as indicated  Nephrology following   HEMATOLOGY A: Anemia of critical illness and chronic disease. Leukocytosis - resolved. Thrombocytopenia - Resolved.  Prior issues during ICU admit.  HIT panel assessed with SRA negative.   Rule out DVT - recent CTA chest negative for PE.  New episode of hypotension / brief drop in sats with SVT P: Trend CBC Resume Heparin for DVT proph 5/2 Hgb >7 goal  Assess LE doppler for DVT  INFECTION A: Peritonitis 2nd to Perforated Pyloric Ulcer  Pelvic Fluid Collection - s/p IR for aspiration/drain of pelvic fluid collection for suspected abscess.  Drain since removed.   HCAP - Resolved.  C-Diff Colitis  P: Completed 14 of Abx Oral Vanco 4/16 >>   5/02  BCx2 >>   ENDOCRINE A: DM type II. P: SSI  NEUROLOGY A: ACA CVA with Lt sided weakness. Deconditioning. P: PT/OT    Canary Brim, NP-C [facility] Pgr: [phone] or [phone]   Attending:  I have seen and examined the patient with nurse practitioner/resident and agree with the note above.   Ms. Somes is a very nice lady well known to me from earlier in her hospitalization when I took care of her in the ICU.  Today on my exam she is now feeling well, breathing comfortably on RA  She is at risk for sepsis but currently doesn't look septic, would plan to repeat cultures but hold ABX change.  CXR images reviewed personally, essentially normal appearing lung fields  She appeared to have an arrhythmia episode earlier today which lead to the dyspnea.  On my exam I  can't hear another lung problem and she is back to her baseline sinus tachycardia.  While today's episode would fit with a PE, she has been evaluated for this extensively.  We will add LE doppler ultrasound testing as well as add back DVT prophylaxis. I suspect her RV failure (poorly understood) makes her susceptible to volume shifts leading to arrhythmias and making her dyspneic.  While she has improved, agree with plan for sepsis work up as above.  Would not treat with new antibiotics for now as she has made a quick recovery (again, another reason this looks cardiac).  If no clear infection shows up in the work up, then she may need a repeat echo.    Will see again tomorrow  Heber Sardis, MD [place] [facility] Pager: [phone] Cell: [phone] If no response, call [phone]

## 2014-08-03 NOTE — Progress Notes (Signed)
Patient had episode of hypoxia, hypotension, and SVT in HD.  Transferred to 3S.  Awaiting CT scan of abdomen.  Will place NGT for leak management and to assist with contrast for CT scan.  Wound VAC also removed during this assessment.  Base of her wound is necrotic with some fascial spread, mostly at the bottom.  There is also some cloudy drainage from the inferior aspect of the wound.  VAC removed.  NS WD dressing changes to begin today for her midline wound management.  Oriel Ojo E 4:12 PM 08/03/2014

## 2014-08-03 NOTE — Progress Notes (Signed)
RT removed patient from BIPAP and placed on a venturi mask. No SOB noted. Vital signs stable at this time. No complications. Patient tolerating well. RN at bedside. RT will continue to monitor.

## 2014-08-03 NOTE — Progress Notes (Signed)
PARENTERAL NUTRITION CONSULT NOTE - Follow-up  Pharmacy Consult:  TPN Indication:  Duodenal fistula  No Known Allergies   Patient Measurements: Height: _0  (167.6 cm) Weight: 179 lb 11.2 oz (81.511 kg) IBW/kg (Calculated) : 59.3  Adjusted body weight: 67 kg  Vital Signs: Temp: 97.7 F (36.5 C) (05/02 0517) Temp Source: Oral (05/02 0517) BP: 92/69 mmHg (05/02 0833) Pulse Rate: 101 (05/02 0828) Intake/Output from previous day: 05/01 0701 - 05/02 0700 In: 0  Out: 1000 [Drains:1000]  Labs:  Recent Labs  08/02/14 0518 08/02/14 1102 08/03/14 0500  WBC 14.4* 12.8* 11.2*  HGB 10.9* 12.3 11.3*  HCT 35.0* 38.9 35.0*  PLT 277 258 243     Recent Labs  08/01/14 0538 08/02/14 0519 08/02/14 1102 08/03/14 0500  NA 137 132* 133* 133*  K 2.8* 3.2* 3.5 3.4*  CL 95* 91* 91* 92*  CO2 _1 GLUCOSE 183* 180* 161* 138*  BUN 23 47* 54* 59*  CREATININE 4.79* 6.50* 7.29* 6.38*  CALCIUM 8.8 8.6* 8.9 8.4*  MG 1.9  --   --  1.4*  PHOS 1.2* 2.3* 1.7* 1.7*  PROT  --   --   --  7.3  ALBUMIN 2.0* 2.0* 2.1* 2.0*  AST  --   --   --  29  ALT  --   --   --  17  ALKPHOS  --   --   --  148*  BILITOT  --   --   --  1.1  PREALBUMIN  --   --   --  9.8*  TRIG  --   --   --  177*   Estimated Creatinine Clearance: 10.3 mL/min (by C-G formula based on Cr of 6.38).    Recent Labs  08/02/14 2202 08/03/14 0614 08/03/14 0800  GLUCAP 165* 151* 151*   Insulin Requirements in the past 24 hours:  3 units Novolog SSI  Assessment: 10 YOF with complicated hospital course. S/p closure perforated pyloric ulcer, SBR for ischemic necrosis, diffuse peritonitis on 07/01/14. Pharmacy consulted to provide TPN 4/1-4/15 for nutrition support for prolonged ileus. Pt then transitioned to po diet + supplements. 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  GI: Duodenal fistula. NPO. On TPN. Drain o/p 1000 ml/24h. Albumin 2.0. Prealbumin remains low at 9.8 Noted  palliative care discussions ongoing but for now pt/family wish for aggressive care.  Endo: h/o DM. CBGs marginal, minimal SSI, had low CBGs prior to TPN start.   Lytes: none in TPN since pt on iHD. K 3.4 after 3 runs K and kphos 15 mM yesterday, to get  4 K bath on HD (goal >/=4 with afib), Phos 1.7 after 15 mM yesterday (no change), d/w D Zeyfang, 68m Na phos, Mg 1.4 (goal >/= 2 with afib), Ca 8.4 CoCa 10  Renal: New ESRD - started 3/17. HD on M/W/F.  4 hr HD planned for today, did not tolerate HD this AM In recliner, sent back to room with plans to re-try HD later today. Aranesp 150 qMonday due today 5/2, Hg 11.3, was 12.2 yesterday - d/w D. Zeyfang, decrease dose to 100 qMon  Pulm: asthma, COPD. 2L [place]. CT negative for PE.  Cards: HTN.  (afib). Cards signed off 4/26. Midodrine, metoprolol, amio. [facility]  Hepatotobil: Alk phos slightly elevated, other LFTs wnl. TG 177  Neuro: hx depression / insomnia - new stroke noted on  3/18.   ID: Pt continues on oral vanc D#11/14 for cdiff colitis. Diarrhea is better.  Best Practices: SCDs, PPI IV  TPN Access: PICC line in IR 4/27  TPN start date: 4/1->4/15; restart 4/27>>  Current Nutrition:  NPO Clinimix 5/15 (no electrolytes) at 65m/hr and 20% IVFE at 166mhr which will provide 1843 kcal and 96 gm protein.  Nutritional Goals: (per RD note 4/28) 2100-2300 kCal, 120-140 grams of protein per day Goal: Clinimix 5/15 at 10061mr + 20% IVFE 4m85m to provide 2184 kcal and 120 gm protein  Plan:  - Increase Clinimix 5/15 (no electrolytes) to100/hr and 20% IVFE at 4ml36m This will provide 100% support.  -Replete mag with 2 gm, replete phos with 30mM 72mhos, to get 4 K HD bath to correct K, check renal panel and Mg in am -decrease Aranesp to 100 qMonday for Hg 11.3 - Trace elements and MVI daily in TPN.  - Continue sensitive SSI q6h  MichelEudelia Bunchm.D. [phone] 10:23 AM

## 2014-08-03 NOTE — Progress Notes (Signed)
PT Cancellation Note  Patient Details Name: Gordana C Lubben MRN: [id number] DOB: [birth year]   Cancelled Treatment:    Reason Eval/Treat Not Completed: Patient at procedure or test/unavailable   Currently in HD;  Will follow up later today as time allows;  Otherwise, will follow up for PT tomorrow;   Thank you,  Van ClinesHolly Toan Mort, PT  Acute Rehabilitation Services Pager [phone] Office [phone]     Van ClinesGarrigan, Xylon Croom [facility] 08/03/2014, 8:40 AM

## 2014-08-03 NOTE — Progress Notes (Signed)
Subjective:  Co abd pain and not able to tolerated hd in recliner this am  Objective Vital signs in last 24 hours: Filed Vitals:   08/03/14 0517 08/03/14 0822 08/03/14 0828 08/03/14 0833  BP: 110/80 82/69 93/67  92/69  Pulse: 107 101 101   Temp: 97.7 F (36.5 C)     TempSrc: Oral     Resp: 17     Height:      Weight:      SpO2: 100%      Weight change: -4.389 kg (-9 lb 10.8 oz)   Physical Exam General: NAD,Alert  Heart: RRR Lungs:  CTA bilat/ no rales Abdomen: soft VAC mid line ND Extremities: no LE edema Dialysis Access: right IJ and left AVF + bruit  Dialysis Orders: new start, on MWF schedule 85.2 post HD 4/28 am   Problem/Plan: 1. [facility] - [facility] / R IJ catheter use due to difficultiesw/AVF/ - K 3.4 this am/ continue to use recliner to help build tolerance (ran 3.5 of 4 hours Friday)/ BUT not able today 2. Vol excess -improved. UF 2L 4/25, 1.9 4/27 and 1.5 4/29 on Midodrine/high volume with TPN  3 PSVT/ extremely high heart right side pressure/-Small PFO-Last  ct chest angio 07/29/14 = no PE, No Pulmonary Edema,Emphysema,CM no acute findings/ on amio-  - Echo =no pericardial effusion/Mod-severe TR, normal LV systolic fx, severe R heart and severe pul HTN .On MTP 25 bid 4. Dialysis access - AVF @ LUA placed 01/28/14 by Dr. Darrick PennaFields, functioned poorly, revision pending as outpatient vs doing revision prior to d/c; using R IJ catheter placed by Dr. Hart RochesterLawson 3/26.  5. S/p Exp lap for perforated pyloric ulcer/SBR for isch bowel now with duodenal fistula - VAC/TPN off Zosyn 4/22 6. Recurrent sepsis - with percutaneous drain (placed 4/8) of pelvic fluid; CCS following  7. C diff colitis - on PO Vancomycin/ no diarrhea this am  8. Hypotension /right heart failure - on midodrine 9. Anemia - Hgb 11.3 s/p 2 U PRBCs 4/13, on Aranesp 150 mcg will taper back dose q Mon hD ; Fe 5% on 3/18.-check Fe studies on Monday HD-  10. Sec HPT -Ca corrected=10.0/ P 1.7  , iPTH 338 - not on Hectorol - pharmacy repleting Pprn 11. 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 H1N1 Influenza with resp failure/- resolved  13. 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 DM type 2 16. Copd/ Asthma- on - pulmicort and continue using flutter valve/pulmonary hygiene and xopenex 17. Goals of care - Palliative Care working with family/pt - still full code; pt reiterated, does not wish to stop HD; needs emotional support       Stephanie Pastelavid Zeyfang, [name] [facility] Beeper [phone] 08/03/2014,10:23 AM  LOS: 47 days   Pt seen, examined and agree w A/P as above.  [name] pager [phone]    cell [phone] 08/03/2014, 1:45 PM    Labs: Basic Metabolic Panel:  Recent Labs Lab 08/02/14 0519 08/02/14 1102 08/03/14 0500  NA 132* 133* 133*  K 3.2* 3.5 3.4*  CL 91* 91* 92*  CO2 29 26 27   GLUCOSE 180* 161* 138*  BUN 47* 54* 59*  CREATININE 6.50* 7.29* 6.38*  CALCIUM 8.6* 8.9 8.4*  PHOS 2.3*  1.7* 1.7*   Liver Function Tests:  Recent Labs Lab 07/29/14 0415 07/30/14 0505  08/02/14 0519 08/02/14 1102 08/03/14 0500  AST 41* 36  --   --   --  29  ALT 18 20  --   --   --  17  ALKPHOS 137* 146*  --   --   --  148*  BILITOT 1.2 0.8  --   --   --  1.1  PROT 6.2 7.7  --   --   --  7.3  ALBUMIN 1.8* 2.1*  < > 2.0* 2.1* 2.0*  < > = values in this interval not displayed. No results for input(s): LIPASE, AMYLASE in the last 168 hours. No results for input(s): AMMONIA in the last 168 hours. CBC:  Recent Labs Lab 07/29/14 2047 07/30/14 0505 08/01/14 0538 08/02/14 0518 08/02/14 1102 08/03/14 0500  WBC 11.1* 11.1* 13.1* 14.4* 12.8* 11.2*  NEUTROABS 8.0*  --   --   --   --  PENDING  HGB 9.7* 11.1* 10.6* 10.9* 12.3 11.3*  HCT 31.4* 35.3* 35.4* 35.0* 38.9 35.0*  MCV 92.6  92.2 94.1 92.1 92.0 90.7  PLT 342 420* 307 277 258 243   Cardiac Enzymes: No results for input(s): CKTOTAL, CKMB, CKMBINDEX, TROPONINI in the last 168 hours. CBG:  Recent Labs Lab 08/02/14 1125 08/02/14 1726 08/02/14 2202 08/03/14 0614 08/03/14 0800  GLUCAP 150* 142* 165* 151* 151*    Studies/Results: No results found. Medications: . TPN (CLINIMIX) Adult without lytes 90 mL/hr at 08/02/14 1710   And  . fat emulsion 240 mL (08/02/14 1710)   . albumin human      . amiodarone  200 mg Oral BID  . budesonide (PULMICORT) nebulizer solution  0.25 mg Nebulization BID  . darbepoetin (ARANESP) injection - DIALYSIS  100 mcg Intravenous Q Mon-HD  . insulin aspart  0-9 Units Subcutaneous 4 times per day  . levothyroxine  25 mcg Oral QAC breakfast  . magnesium sulfate 1 - 4 g bolus IVPB  2 g Intravenous Once  . metoprolol tartrate  25 mg Oral BID  . midodrine      . midodrine  10 mg Oral BID WC  . sodium chloride  10-40 mL Intracatheter Q12H  . sodium phosphate  Dextrose 5% IVPB  30 mmol Intravenous Once  . vancomycin  125 mg Oral 4 times per day

## 2014-08-03 NOTE — Progress Notes (Signed)
[name], female   DOB: [birth year], 59 y.o.   MRN: [id number] 33 Days Post-Op  Subjective: Pt c/o new epigastric abdominal pain today.  More severe than it has been.  Objective: Vital signs in last 24 hours: Temp:  [97.7 F (36.5 C)-97.8 F (36.6 C)] 97.7 F (36.5 C) (05/02 0517) Pulse Rate:  [101-113] 101 (05/02 0828) Resp:  [16-19] 17 (05/02 0517) BP: (82-114)/(67-80) 92/69 mmHg (05/02 0833) SpO2:  [99 %-100 %] 100 % (05/02 0517) Weight:  [81.511 kg (179 lb 11.2 oz)] 81.511 kg (179 lb 11.2 oz) (05/01 2156) Last BM Date: 07/31/14  Intake/Output from previous day: 05/01 0701 - 05/02 0700 In: 0  Out: 1000 [Drains:1000] Intake/Output this shift: Total I/O In: -  Out: 950 [Drains:950]  PE: Abd: soft, more tender in epigastrium, JP drain now with some bloody drainage, cannister with enteric bilious drainage.  VAC output is thin cloudy drainage.  VAC in place  Lab Results:   Recent Labs  08/02/14 1102 08/03/14 0500  WBC 12.8* 11.2*  HGB 12.3 11.3*  HCT 38.9 35.0*  PLT 258 243   BMET  Recent Labs  08/02/14 1102 08/03/14 0500  NA 133* 133*  K 3.5 3.4*  CL 91* 92*  CO2 26 27  GLUCOSE 161* 138*  BUN 54* 59*  CREATININE 7.29* 6.38*  CALCIUM 8.9 8.4*   PT/INR No results for input(s): LABPROT, INR in the last 72 hours. CMP     Component Value Date/Time   NA 133* 08/03/2014 0500   NA 139 10/02/2012 1046   K 3.4* 08/03/2014 0500   K 4.4 10/02/2012 1046   CL 92* 08/03/2014 0500   CO2 27 08/03/2014 0500   CO2 25 10/02/2012 1046   GLUCOSE 138* 08/03/2014 0500   GLUCOSE 110 10/02/2012 1046   BUN 59* 08/03/2014 0500   BUN 35.2* 10/02/2012 1046   CREATININE 6.38* 08/03/2014 0500   CREATININE 2.5* 10/02/2012 1046   CALCIUM 8.4* 08/03/2014 0500   CALCIUM 9.8 10/02/2012 1046   PROT 7.3 08/03/2014 0500   PROT 8.4* 10/02/2012 1046   ALBUMIN 2.0* 08/03/2014 0500   ALBUMIN 3.1* 10/02/2012 1046   AST 29 08/03/2014 0500   AST 9 10/02/2012 1046   ALT 17 08/03/2014 0500   ALT <6 Repeated and Verified 10/02/2012 1046   ALKPHOS 148* 08/03/2014 0500   ALKPHOS 108 10/02/2012 1046   BILITOT 1.1 08/03/2014 0500   BILITOT 0.21 10/02/2012 1046   GFRNONAA 6* 08/03/2014 0500   GFRAA 8* 08/03/2014 0500   Lipase  No results found for: LIPASE     Studies/Results: No results found.  Anti-infectives: Anti-infectives    Start     Dose/Rate Route Frequency Ordered Stop   07/18/14 1800  vancomycin (VANCOCIN) 50 mg/mL oral solution 125 mg     125 mg Oral 4 times per day 07/18/14 1455     07/17/14 1830  vancomycin (VANCOCIN) IVPB 750 mg/150 ml premix     750 mg 150 mL/hr over 60 Minutes Intravenous  Once 07/17/14 1818 07/18/14 0024   07/15/14 1400  metroNIDAZOLE (FLAGYL) tablet 500 mg  Status:  Discontinued     500 mg Oral 3 times per day 07/15/14 1202 07/18/14 1533   07/13/14 2200  piperacillin-tazobactam (ZOSYN) IVPB 2.25 g  Status:  Discontinued     2.25 g 100 mL/hr over 30 Minutes Intravenous 3 times per day 07/13/14 1318 07/24/14 1138   07/13/14 2000  fluconazole (DIFLUCAN) IVPB 200 mg  Status:  Discontinued     200 mg 100 mL/hr over 60 Minutes Intravenous Every 24 hours 07/13/14 1318 07/19/14 1027   07/07/14 1200  vancomycin (VANCOCIN) IVPB 1000 mg/200 mL premix  Status:  Discontinued     1,000 mg 200 mL/hr over 60 Minutes Intravenous Every 24 hours 07/07/14 0937 07/14/14 1712   07/06/14 2000  fluconazole (DIFLUCAN) IVPB 400 mg  Status:  Discontinued     400 mg 100 mL/hr over 120 Minutes Intravenous Every 24 hours 07/06/14 1507 07/13/14 1318   07/06/14 1800  piperacillin-tazobactam (ZOSYN) IVPB 2.25 g  Status:  Discontinued     2.25 g 100 mL/hr over 30 Minutes Intravenous 4 times per day 07/06/14 1505 07/13/14 1318   07/05/14 2000  fluconazole (DIFLUCAN) IVPB 200 mg  Status:  Discontinued     200 mg 100 mL/hr over 60 Minutes Intravenous Every 24 hours 07/05/14 0757 07/06/14 1507   07/05/14 1400  piperacillin-tazobactam (ZOSYN)  IVPB 2.25 g  Status:  Discontinued     2.25 g 100 mL/hr over 30 Minutes Intravenous 3 times per day 07/05/14 0749 07/06/14 1505   07/02/14 1800  vancomycin (VANCOCIN) IVPB 1000 mg/200 mL premix  Status:  Discontinued     1,000 mg 200 mL/hr over 60 Minutes Intravenous Every 24 hours 07/01/14 1858 07/05/14 0801   07/01/14 2200  piperacillin-tazobactam (ZOSYN) IVPB 3.375 g  Status:  Discontinued     3.375 g 100 mL/hr over 30 Minutes Intravenous 4 times per day 07/01/14 1858 07/05/14 0749   07/01/14 2000  fluconazole (DIFLUCAN) IVPB 400 mg  Status:  Discontinued     400 mg 100 mL/hr over 120 Minutes Intravenous Every 24 hours 07/01/14 1858 07/05/14 0757   07/01/14 1400  fluconazole (DIFLUCAN) IVPB 200 mg  Status:  Discontinued     200 mg 100 mL/hr over 60 Minutes Intravenous Every 24 hours 07/01/14 1330 07/01/14 1853   07/01/14 1000  piperacillin-tazobactam (ZOSYN) IVPB 2.25 g  Status:  Discontinued     2.25 g 100 mL/hr over 30 Minutes Intravenous Every 8 hours 07/01/14 0952 07/01/14 1853   07/01/14 1000  vancomycin (VANCOCIN) 500 mg in sodium chloride 0.9 % 100 mL IVPB     500 mg 100 mL/hr over 60 Minutes Intravenous  Once 07/01/14 0952 07/01/14 1126   06/30/14 1200  vancomycin (VANCOCIN) IVPB 1000 mg/200 mL premix     1,000 mg 200 mL/hr over 60 Minutes Intravenous  Once 06/30/14 0944 06/30/14 1403   06/30/14 1200  ceFEPIme (MAXIPIME) 2 g in dextrose 5 % 50 mL IVPB     2 g 100 mL/hr over 30 Minutes Intravenous  Once 06/30/14 0944 06/30/14 1425   06/30/14 0100  vancomycin (VANCOCIN) 2,000 mg in sodium chloride 0.9 % 500 mL IVPB     2,000 mg 250 mL/hr over 120 Minutes Intravenous  Once 06/30/14 0048 06/30/14 0525   06/30/14 0100  ceFEPIme (MAXIPIME) 2 g in dextrose 5 % 50 mL IVPB     2 g 100 mL/hr over 30 Minutes Intravenous  Once 06/30/14 0048 06/30/14 0322   06/27/14 0600  cefUROXime (ZINACEF) 1.5 g in dextrose 5 % 50 mL IVPB     1.5 g 100 mL/hr over 30 Minutes Intravenous On call to  O.R. 06/26/14 1019 06/27/14 0630   06/19/14 1800  oseltamivir (TAMIFLU) capsule 30 mg     30 mg Oral Every M-W-F (1800) 06/19/14 1003 06/22/14 1841   06/18/14 1600  oseltamivir (TAMIFLU) capsule 30 mg  Status:  Discontinued     30 mg Oral Daily 06/18/14 1538 06/19/14 1003       Assessment/Plan  POD #33 s/p Exploratory Laparotomy with graham patch closure of perforated pyloric ulcer, SBR for ischemic bowel - 07/01/2014 - Derrell Lolling Intra-abdominal fluid collection, no drain Now with duodenal fistula -CT 07/24/14 showed 6.6x3.4cm pre-rectal fluid collection. no drain was replaced after previous drain accidentally pulled out -Fistula study done and shows JP drain connection to the duodenal bulb. - patient is currently being treated with NPO and TNA as she and her family have wished for aggressive treatment at this point. Her output from her drain significantly increased over the last 4 days.  Strict NPO -new abdominal pain today 08-03-14 and blood drainage output.  Will repeat CT scan for further evaluation -VAC change on MWF -I&Os and routine drain care VTE prophylaxis-SCDs ID--Zosyn stopped on day #23 (07/24/14) C diff + -- Oral vanc New ESRD  PCM/TNA    LOS: 47 days    Analuisa Tudor E 08/03/2014, 11:12 AM Pager: [phone]

## 2014-08-03 NOTE — Progress Notes (Signed)
Patient became hypotensive BP 70's , HR 150 sustained.  UF turned off and given some NS.  Shortly afterwards c/o SOB , orthopnea, uncomfortable and restless. Rinsing back and ordered stat CXR, rapid reponse and d/c TNA for now. Replace w D5W at 30/hr. Suspect vol overload from TNA. Poor candidate for TNA right now and very unstable on HD today.   Vinson Moselleob Julietta Batterman MD (pgr) [phone]    [phone]) [phone] 08/03/2014, 1:47 PM

## 2014-08-03 NOTE — Progress Notes (Addendum)
Progress Note [facility].   Iyania C Fulmore ZOX:[id number] DOB: Oct 25, 1955 DOA: 06/17/2014 PCP: [name], MD  Admit HPI / Brief Narrative: 59 yo female smoker with hx HTN, ESRD on HD (just began this admit), COPD, failed L AV fistula, initially admitted 3/16 with flu and acute hypoxic resp failure. Ultimately tx to [facility] due to need for HD. 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   Patient on 3/31 was taking to the OR for ex lap for per pyloric ulcer and sm bowel resection. Patient develops septic shock on 4-1, she was started on pressors and wean off pressors on 4-3. Subsequently she was diagnosed with pelvic abscess by CT scan perform on 4-7. She underwent pelvic drain placement by IR. Patient also develops C. diff diagnosed 4-12.  C diff was not responding flagyl, and she was started on vancomycin 4-16. She also develops  run of SVT. Cardiology has been helping with management. Course complicated with high RV pressure and concerns for PE; also with fistula formation between anastomosis and blake drain, making her back to NPO state and in need of TPN. Family want everything to be done. Palliative care consulted. Currently Full code.  HPI/Subjective: No more diarrhea.  Complaining of abdominal pain today, and back pain.  Denies dyspnea or chest pain.    Assessment/Plan: Acute hypoxic Respiratory Failure:  Patient became hypoxic during dialysis, hypotensive and tachycardic Hr in the 140 range,  She was started on BIPAP. Received IV bolus. Doing better on BIPAP now.  Her BP has increase to 96, HR has decrease to 118.  IV metoprolol PRN.  Cardiology inform of events.  I will consult CCM. Will check stat CBC to follow hb.  Chest x ray no acute pulmonary edema.   C. difficile colitis C diff positive on 4-12.  Received 4 days of flagyl without improvement.  Will count as day 11/14 for oral vanc Diarrhea  resolved.   Status post exploratory laparotomy with closure  of perforated pyloric ulcer/abdominal abscess / small bowel resection w/ postop ileus and now with fistula General Surgery continues to follow Due to Fistula is back to NPO state , TPN.  Complaining of abdominal pain. Plan to repeat CT abdomen today.   Pelvic abscess status post percutaneous drain placement 4/8; now with leakage from anastomosis site -Ongoing care per general surgery -Received  Zosyn for 23 days. IV antibiotics discontinue by Surgery.  -Received  18 days of vancomycin and  18 days of fluconazole.  -Body fluid grew lactobacillus species.  -Will follow recommendations from CCS; -Patient with leakage from anastomosis site needs to be NPO and back to TPN. -Palliative care consulted, awaiting further conversations.  -follow up WBC trending down.  Hypokalemia; correction with dialysis.   Acute hypoxic respiratory failure secondary to H1 N1 influenza +/- HCAP -Has completed antibiotics.  -No accessory muscles use; requiring 2 L of oxygen  -will continue use pulmicort and continue using flutter valve/pulmonary hygiene and xopenex -CT angio negative for PE.   PSVT/concerns for pulmonary embolism given extremely high heart right side pressure -Has been evaluated by EP - TSH is slightly elevated; will check Free T4 and T3 -Cardiology following - follow K+ and Mg  -Continue with metoprolol and amiodarone; cardiology adjusting medications.  -2-D echo to be repeated, with concerns for pericardial rub -Echo demonstrated extremely elevated heart right side pressure and moderately reduce function of RV -V-Q scan with intermediate probability for pulmonary embolism.  -CTA  negative for PE.   Small PFO Has been evaluated by Cardiology No need for closure currently  ACA CVA with Lt sided weakness Will need ongoing long term rehab - PT/OT to cont to follow while inpatient Patient remains very weak and deconditioned .   ESRD new start HD this admit  Ongoing hemodialysis per  Nephrology - perm-cath placed 3/26 d/t AVF problems Electrolytes abnormalities corrected with dialysis.  Patient to have AV fistula reviewed at some point for permanent access (most likely in outpatient setting) .  Patient will need to be able to sit up for dialysis. Unable to sit today for dialysis 5-02.   Chronic diastolic congestive heart failure w/ Severe RHF EF 60-65%, grade 1 diastolic dysfunction. Appears compensated.  Fluids/volume manage with hemodialysis.  Concerns for PE on 2-D echo; CTA negative for PE>   Anemia of critical illness and chronic disease Hgb presently stable s/p 2U PRBC 4/13 - follow  HB remains stable.  Aranesp/iron per renal discretion   Thrombocytopenia; resolved.  Appears to be associated with sepsis plt count now within normal limits No signs of overt bleeding currently Has had mild positive HIT test during this admission   Asthma/COPD Continue oxygen supplementation as needed Will continue pulmicort Continue as needed xopenex  Diabetes mellitus 2 Hypoglycemia in setting of NPO status. TPN wil be started today.  Holding SSI at this moment; will need re-initiation once on TNA again  Abnormal TSH -Free T4 WNL -T3 1.8 -most likely sick thyroid with ongoing infection and body stress; also due to amiodarone use. TSH in 7 range -will continue low dose synthroid given PSVT  Obesity - Inadequate oral intake related to altered GI function as evidenced by limited intake, ongoing.  Body mass index is 29.02 kg/(m^2). -patient weight trending down -patient unable to tolerate diet and with positive leaking fistula  Code Status: FULL Family Communication: discussed with patient.  Disposition Plan: Will need long-term rehabilitation at SNF; once able to tolerate HD on recliner and  surgical issues stable.  Consultants: Cardiology Nephrology PCCM Gen Surgery  Palliative care  Significant Events: 3/16 influenza A+ 3/18 R ACA stroke 3/19 MRI  multifocal acute infarction of both hemispheres, large right distal ACA 3/22 TEE severe RAE, severely reduced RV function, severe TR, no LAA thrombus, small PFO on TEE 3/31 to OR ex lap for per pyloric ulcer and sm bowel resection 4/1 septic shock on pressors 4/3 pressors weaned off  4/4 restart pressors, CRRT 4/6 levo switched to neo 4/7 pelvic abscess on CT 4/8 IR placed pelvic drain 4/9 off pressors 4/11 long run of SVT. 4/23 VQ scan which demonstrated intermediate probability for PE 4/24 CT angiogram of her chest: Pending/unable to be done due to lack of access   Antibiotics: Zosyn >stopped on 4/22 (received a total of 23 days of treatment ) Vancomycin 4-16  DVT prophylaxis: SCDs  Objective: Blood pressure 101/67, pulse 111, temperature 97.2 F (36.2 C), temperature source Oral, resp. rate 20, height  (1.676 m), weight 81.511 kg (179 lb 11.2 oz), SpO2 100 %.  Intake/Output Summary (Last 24 hours) at 08/03/14 1241 Last data filed at 08/03/14 1106  Gross per 24 hour  Intake      0 ml  Output   1950 ml  Net  -1950 ml   Exam: General: NAD.  Lungs: decrease breath sounds, no wheezing Cardiovascular: Regular rate, no murmurs and no gallops   Abdomen: Abdominal wound dressing, clean and dry; abdomen slightly distended, drain intact  and with /greenigh drainage; patient reports mild pain with palpation;  Extremities: no edema   Data Reviewed: Basic Metabolic Panel:  Recent Labs Lab 07/29/14 0415 07/30/14 0505 07/31/14 1050 08/01/14 0538 08/02/14 0519 08/02/14 1102 08/03/14 0500  NA 134* 138 137 137 132* 133* 133*  K 4.0 3.3* 3.0* 2.8* 3.2* 3.5 3.4*  CL 94* 99 94* 95* 91* 91* 92*  CO2 25 25 30 30 29 26 27   GLUCOSE 55* 138* 131* 183* 180* 161* 138*  BUN 29* 14 36* 23 47* 54* 59*  CREATININE 6.65* 5.20* 8.44* 4.79* 6.50* 7.29* 6.38*  CALCIUM 8.4 8.6 8.8 8.8 8.6* 8.9 8.4*  MG 2.0 2.0  --  1.9  --   --  1.4*  PHOS 6.6* 3.6  --  1.2* 2.3* 1.7* 1.7*    Liver  Function Tests:  Recent Labs Lab 07/29/14 0415 07/30/14 0505 08/01/14 0538 08/02/14 0519 08/02/14 1102 08/03/14 0500  AST 41* 36  --   --   --  29  ALT 18 20  --   --   --  17  ALKPHOS 137* 146*  --   --   --  148*  BILITOT 1.2 0.8  --   --   --  1.1  PROT 6.2 7.7  --   --   --  7.3  ALBUMIN 1.8* 2.1* 2.0* 2.0* 2.1* 2.0*   CBC:  Recent Labs Lab 07/29/14 2047 07/30/14 0505 08/01/14 0538 08/02/14 0518 08/02/14 1102 08/03/14 0500  WBC 11.1* 11.1* 13.1* 14.4* 12.8* 11.2*  NEUTROABS 8.0*  --   --   --   --  7.8*  HGB 9.7* 11.1* 10.6* 10.9* 12.3 11.3*  HCT 31.4* 35.3* 35.4* 35.0* 38.9 35.0*  MCV 92.6 92.2 94.1 92.1 92.0 90.7  PLT 342 420* 307 277 258 243    CBG:  Recent Labs Lab 08/02/14 1125 08/02/14 1726 08/02/14 2202 08/03/14 0614 08/03/14 0800  GLUCAP 150* 142* 165* 151* 151*    Scheduled Meds:  Scheduled Meds: . albumin human  12.5 g Intravenous Once  . albumin human      . amiodarone  200 mg Oral BID  . budesonide (PULMICORT) nebulizer solution  0.25 mg Nebulization BID  . darbepoetin (ARANESP) injection - DIALYSIS  100 mcg Intravenous Q Mon-HD  .  HYDROmorphone (DILAUDID) injection  0.2 mg Intravenous Q6H  . insulin aspart  0-9 Units Subcutaneous 4 times per day  . levothyroxine  25 mcg Oral QAC breakfast  . magnesium sulfate 1 - 4 g bolus IVPB  2 g Intravenous Once  . metoprolol tartrate  25 mg Oral BID  . midodrine  10 mg Oral BID WC  . sodium chloride  10-40 mL Intracatheter Q12H  . sodium phosphate  Dextrose 5% IVPB  30 mmol Intravenous Once  . vancomycin  125 mg Oral 4 times per day    Time spent on care of this patient: 35 minutes   Alba Coryegalado, Suzane Vanderweide A , MD  [phone] [facility] Office  [phone] Pager - Text Page per Amion as per below:  On-Call/Text Page:      [email or web address]      password [email or web address]  If 7PM-7AM, please contact night-coverage [email or web address] Password [email or web address] 08/03/2014, 12:41 PM   LOS: 47 days

## 2014-08-03 NOTE — Progress Notes (Signed)
CSW still following patient for placement and disposition. Patient continues to not be able to sit up for dialysis and needs to be able to complete full dialysis before DC to SNF. Patient currently not ready for discharge and CSW will continue to follow and assist with needs.  Patient still needs bed offers.  Deretha EmoryHannah Duan Scharnhorst LCSW, MSW Clinical Social Work: Emergency Room [phone]  (covering for Genelle BalVanessa Crawford)

## 2014-08-04 ENCOUNTER — Encounter (HOSPITAL_COMMUNITY): Payer: Self-pay

## 2014-08-04 ENCOUNTER — Inpatient Hospital Stay (HOSPITAL_COMMUNITY): Payer: Medicaid Other

## 2014-08-04 DIAGNOSIS — I471 Supraventricular tachycardia: Secondary | ICD-10-CM | POA: Insufficient documentation

## 2014-08-04 DIAGNOSIS — I50810 Right heart failure, unspecified: Secondary | ICD-10-CM | POA: Insufficient documentation

## 2014-08-04 DIAGNOSIS — R0600 Dyspnea, unspecified: Secondary | ICD-10-CM

## 2014-08-04 LAB — CBC
HEMATOCRIT: 30.6 % — AB (ref 36.0–46.0)
Hemoglobin: 9.6 g/dL — ABNORMAL LOW (ref 12.0–15.0)
MCH: 28.4 pg (ref 26.0–34.0)
MCHC: 31.4 g/dL (ref 30.0–36.0)
MCV: 90.5 fL (ref 78.0–100.0)
Platelets: 255 10*3/uL (ref 150–400)
RBC: 3.38 MIL/uL — AB (ref 3.87–5.11)
RDW: 23.7 % — AB (ref 11.5–15.5)
WBC: 14 10*3/uL — ABNORMAL HIGH (ref 4.0–10.5)

## 2014-08-04 LAB — RENAL FUNCTION PANEL
Albumin: 2.1 g/dL — ABNORMAL LOW (ref 3.5–5.0)
Anion gap: 12 (ref 5–15)
BUN: 53 mg/dL — ABNORMAL HIGH (ref 6–20)
CO2: 28 mmol/L (ref 22–32)
Calcium: 8.4 mg/dL — ABNORMAL LOW (ref 8.9–10.3)
Chloride: 91 mmol/L — ABNORMAL LOW (ref 101–111)
Creatinine, Ser: 5.73 mg/dL — ABNORMAL HIGH (ref 0.44–1.00)
GFR, EST AFRICAN AMERICAN: 9 mL/min — AB (ref >60)
GFR, EST NON AFRICAN AMERICAN: 7 mL/min — AB (ref >60)
Glucose, Bld: 137 mg/dL — ABNORMAL HIGH (ref 70–99)
Phosphorus: 2 mg/dL — ABNORMAL LOW (ref 2.5–4.6)
Potassium: 3.4 mmol/L — ABNORMAL LOW (ref 3.5–5.1)
SODIUM: 131 mmol/L — AB (ref 135–145)

## 2014-08-04 LAB — GLUCOSE, CAPILLARY
GLUCOSE-CAPILLARY: 137 mg/dL — AB (ref 70–99)
Glucose-Capillary: 149 mg/dL — ABNORMAL HIGH (ref 70–99)
Glucose-Capillary: 169 mg/dL — ABNORMAL HIGH (ref 70–99)
Glucose-Capillary: 131 mg/dL — ABNORMAL HIGH (ref 70–99)

## 2014-08-04 LAB — MAGNESIUM: MAGNESIUM: 1.4 mg/dL — AB (ref 1.7–2.4)

## 2014-08-04 MED ORDER — POTASSIUM CHLORIDE 10 MEQ/100ML IV SOLN
10.0000 meq | INTRAVENOUS | Status: AC
  Administered 2014-08-04 (×2): 10 meq via INTRAVENOUS
  Filled 2014-08-04 (×4): qty 100

## 2014-08-04 MED ORDER — PENTAFLUOROPROP-TETRAFLUOROETH EX AERO: 1.0000 "application " | INHALATION_SPRAY | CUTANEOUS | Status: DC | PRN

## 2014-08-04 MED ORDER — METOPROLOL TARTRATE 1 MG/ML IV SOLN
2.5000 mg | Freq: Three times a day (TID) | INTRAVENOUS | Status: DC
  Administered 2014-08-05 – 2014-08-15 (×13): 2.5 mg via INTRAVENOUS
  Filled 2014-08-04 (×33): qty 5

## 2014-08-04 MED ORDER — VANCOMYCIN 50 MG/ML ORAL SOLUTION
125.0000 mg | Freq: Four times a day (QID) | ORAL | Status: DC
  Administered 2014-08-04 – 2014-08-08 (×16): 125 mg via ORAL
  Filled 2014-08-04 (×20): qty 2.5

## 2014-08-04 MED ORDER — LEVOTHYROXINE SODIUM 100 MCG IV SOLR
12.5000 ug | Freq: Every day | INTRAVENOUS | Status: DC
  Administered 2014-08-04 – 2014-08-26 (×21): 12.5 ug via INTRAVENOUS
  Filled 2014-08-04 (×24): qty 5

## 2014-08-04 MED ORDER — MAGNESIUM SULFATE 2 GM/50ML IV SOLN
2.0000 g | Freq: Once | INTRAVENOUS | Status: AC
  Administered 2014-08-04: 2 g via INTRAVENOUS
  Filled 2014-08-04: qty 50

## 2014-08-04 MED ORDER — FAT EMULSION 20 % IV EMUL
240.0000 mL | INTRAVENOUS | Status: AC
  Administered 2014-08-04: 240 mL via INTRAVENOUS

## 2014-08-04 MED ORDER — LEVALBUTEROL HCL 0.63 MG/3ML IN NEBU
0.6300 mg | INHALATION_SOLUTION | Freq: Four times a day (QID) | RESPIRATORY_TRACT | Status: DC
  Administered 2014-08-04 – 2014-08-06 (×8): 0.63 mg via RESPIRATORY_TRACT
  Filled 2014-08-04 (×16): qty 3

## 2014-08-04 MED ORDER — NEPRO/CARBSTEADY PO LIQD
237.0000 mL | ORAL | Status: DC | PRN
  Filled 2014-08-04: qty 237

## 2014-08-04 MED ORDER — SODIUM CHLORIDE 0.9 % IV SOLN: 100.0000 mL | INTRAVENOUS | Status: DC | PRN

## 2014-08-04 MED ORDER — TRACE MINERALS CR-CU-F-FE-I-MN-MO-SE-ZN IV SOLN
INTRAVENOUS | Status: AC
  Administered 2014-08-04: 18:00:00 via INTRAVENOUS

## 2014-08-04 MED ORDER — ALTEPLASE 2 MG IJ SOLR
2.0000 mg | Freq: Once | INTRAMUSCULAR | Status: AC | PRN
  Filled 2014-08-04: qty 2

## 2014-08-04 MED ORDER — CHLORHEXIDINE GLUCONATE 0.12 % MT SOLN
15.0000 mL | Freq: Two times a day (BID) | OROMUCOSAL | Status: DC
  Administered 2014-08-05 – 2014-09-11 (×58): 15 mL via OROMUCOSAL
  Filled 2014-08-04 (×78): qty 15

## 2014-08-04 MED ORDER — LIDOCAINE-PRILOCAINE 2.5-2.5 % EX CREA
1.0000 "application " | TOPICAL_CREAM | CUTANEOUS | Status: DC | PRN
  Filled 2014-08-04: qty 5

## 2014-08-04 MED ORDER — POTASSIUM CHLORIDE 10 MEQ/100ML IV SOLN
10.0000 meq | INTRAVENOUS | Status: AC
  Administered 2014-08-04: 10 meq via INTRAVENOUS

## 2014-08-04 MED ORDER — POTASSIUM CHLORIDE 10 MEQ/50ML IV SOLN
INTRAVENOUS | Status: AC
  Administered 2014-08-04: 10 meq
  Filled 2014-08-04: qty 50

## 2014-08-04 MED ORDER — SODIUM PHOSPHATE 3 MMOLE/ML IV SOLN
30.0000 mmol | Freq: Once | INTRAVENOUS | Status: AC
  Administered 2014-08-04: 30 mmol via INTRAVENOUS
  Filled 2014-08-04 (×2): qty 10

## 2014-08-04 MED ORDER — LIDOCAINE HCL (PF) 1 % IJ SOLN: 5.0000 mL | INTRAMUSCULAR | Status: DC | PRN

## 2014-08-04 MED ORDER — CETYLPYRIDINIUM CHLORIDE 0.05 % MT LIQD
7.0000 mL | Freq: Two times a day (BID) | OROMUCOSAL | Status: DC
  Administered 2014-08-05 – 2014-09-11 (×61): 7 mL via OROMUCOSAL

## 2014-08-04 NOTE — Progress Notes (Signed)
Patient is refusing CPAP for tonight. Pt wore last night and has machine in the room. RT made pt aware that if she changed her mind at all to call and we will place her on CPAP. Family member in room.

## 2014-08-04 NOTE — Progress Notes (Signed)
[facility] PCCM  S: Feeling miserable today because she can't eat, but otherwise no episodes like yesterday O: Filed Vitals:   08/04/14 0700 08/04/14 0729 08/04/14 0919 08/04/14 0930  BP:  119/66    Pulse:  102    Temp: 97.7 F (36.5 C)     TempSrc: Oral     Resp:  23    Height:      Weight:      SpO2:  100% 95% 100%    Gen: chornically ill appearing HENT: NCAT, EOMi PULM: lungs clear CV: Tachy, regular AB: BS+, soft Ext: warm, chronic edema  Neuro: A&Ox4  Impression: 1) Right heart failure> uncertain etiology, does not have evidence of PE or DVT; if she survives this then she would benefit from a RHC 2) Duodenal ulcer repair leak> NPO, TPN per surgery 3) Afib > improved, IV amio if SVT  Ultrasound of LE reviewed in real time > no clear DVT on my review Chart reviewed including surgery and cardiology notes as summarized above  Completely agree with palliative involvement.  I hope she will get through this but given duration of illness and complexity of the sitaution I agree with balancing her desires for dignity/comfort against aggressive measures.  PCCM to sign off  Heber CarolinaBrent Casey Fye, MD [facility] Pager: [phone] Cell: [phone] If no response, call [phone]

## 2014-08-04 NOTE — Progress Notes (Signed)
SUBJECTIVE:  She is uncomfortable with abdominal discomfort   PHYSICAL EXAM Filed Vitals:   08/04/14 0700 08/04/14 0729 08/04/14 0919 08/04/14 0930  BP:  119/66    Pulse:  102    Temp: 97.7 F (36.5 C)     TempSrc: Oral     Resp:  23    Height:      Weight:      SpO2:  100% 95% 100%   General:  No distress Lungs:  Clear Heart:  Tachy Abdomen:  Decreased bowel sounds Extremities:  No edema  LABS:  Results for orders placed or performed during the hospital encounter of 06/17/14 (from the past 24 hour(s))  CBC     Status: Abnormal   Collection Time: 08/03/14  4:30 PM  Result Value Ref Range   WBC 12.4 (H) 4.0 - 10.5 K/uL   RBC 3.68 (L) 3.87 - 5.11 MIL/uL   Hemoglobin 10.7 (L) 12.0 - 15.0 g/dL   HCT 16.133.1 (L) 09.636.0 - 04.546.0 %   MCV 89.9 78.0 - 100.0 fL   MCH 29.1 26.0 - 34.0 pg   MCHC 32.3 30.0 - 36.0 g/dL   RDW 40.923.2 (H) 81.111.5 - 91.415.5 %   Platelets 121 (L) 150 - 400 K/uL  Glucose, capillary     Status: Abnormal   Collection Time: 08/03/14  6:05 PM  Result Value Ref Range   Glucose-Capillary 150 (H) 70 - 99 mg/dL   Comment 1 Notify RN    Comment 2 Document in Chart   Lactic acid, plasma     Status: None   Collection Time: 08/03/14  8:10 PM  Result Value Ref Range   Lactic Acid, Venous 1.3 0.5 - 2.0 mmol/L  Cortisol     Status: None   Collection Time: 08/03/14  8:15 PM  Result Value Ref Range   Cortisol, Plasma 17.1 ug/dL  Glucose, capillary     Status: Abnormal   Collection Time: 08/03/14 11:38 PM  Result Value Ref Range   Glucose-Capillary 143 (H) 70 - 99 mg/dL  Glucose, capillary     Status: Abnormal   Collection Time: 08/04/14  5:29 AM  Result Value Ref Range   Glucose-Capillary 149 (H) 70 - 99 mg/dL  Renal function panel     Status: Abnormal   Collection Time: 08/04/14  7:54 AM  Result Value Ref Range   Sodium 131 (L) 135 - 145 mmol/L   Potassium 3.4 (L) 3.5 - 5.1 mmol/L   Chloride 91 (L) 101 - 111 mmol/L   CO2 28 22 - 32 mmol/L   Glucose, Bld 137 (H)  70 - 99 mg/dL   BUN 53 (H) 6 - 20 mg/dL   Creatinine, Ser 7.825.73 (H) 0.44 - 1.00 mg/dL   Calcium 8.4 (L) 8.9 - 10.3 mg/dL   Phosphorus 2.0 (L) 2.5 - 4.6 mg/dL   Albumin 2.1 (L) 3.5 - 5.0 g/dL   GFR calc non Af Amer 7 (L) >60 mL/min   GFR calc Af Amer 9 (L) >60 mL/min   Anion gap 12 5 - 15  Magnesium     Status: Abnormal   Collection Time: 08/04/14  7:54 AM  Result Value Ref Range   Magnesium 1.4 (L) 1.7 - 2.4 mg/dL  CBC     Status: Abnormal   Collection Time: 08/04/14  9:58 AM  Result Value Ref Range   WBC 14.0 (H) 4.0 - 10.5 K/uL   RBC 3.38 (L) 3.87 - 5.11 MIL/uL   Hemoglobin  9.6 (L) 12.0 - 15.0 g/dL   HCT 09.8 (L) 11.9 - 14.7 %   MCV 90.5 78.0 - 100.0 fL   MCH 28.4 26.0 - 34.0 pg   MCHC 31.4 30.0 - 36.0 g/dL   RDW 82.9 (H) 56.2 - 13.0 %   Platelets 255 150 - 400 K/uL    Intake/Output Summary (Last 24 hours) at 08/04/14 1045 Last data filed at 08/04/14 0600  Gross per 24 hour  Intake   1450 ml  Output    980 ml  Net    470 ml      ASSESSMENT AND PLAN:  SVT:  Event yesterday.  She was put on increased dose amio.  However, she no longer has an NG.  She refuses to have it put back in.  For now we will hold amio and midodrine.  If she has recurrent SVT then start IV amio.    Fayrene Fearing [facility] 08/04/2014 10:45 AM

## 2014-08-04 NOTE — Progress Notes (Signed)
Occupational Therapy Treatment Patient Details Name: [name] MRN: [id number] DOB: October 13, 1955 Today's Date: 08/04/2014    History of present illness [name] is a 59 y.o. female with a history of CKD and hypertension came to the [facility] ED on 3/16 complaining of shortness of breath and generalized weakness x 2 weeks. Admitted with acute hypoxemic respiratory failure. During hospital stay noted to have weakness of her left side. 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  She developed septic shock 4/1 with pressors weaned off 4/3.  Began CRRT and pressors restarted 4/4; Developed pelvic abcess 4/7 with pelvic drain placed 4/8.  weaned off pressors 4/9.  Runs of SVT 4/5 and 4/11   OT comments  Pt progressing slowly toward goals.  She performed bil. UE exercise while seated EOB and required min A +2 to transfer to chair.   Continue to recommend SNF at discharge.   Follow Up Recommendations  SNF;Supervision/Assistance - 24 hour    Equipment Recommendations  None recommended by OT    Recommendations for Other Services      Precautions / Restrictions Precautions Precautions: Fall Precaution Comments: JP drain attached to wall suction        Mobility Bed Mobility Overal bed mobility: Needs Assistance Bed Mobility: Rolling;Sidelying to Sit Rolling: Min assist Sidelying to sit: Min assist       General bed mobility comments: assist to move LEs to EOB and assist to lift trunk   Transfers Overall transfer level: Needs assistance Equipment used: 2 person hand held assist Transfers: Sit to/from [employer] Sit to Stand: Min assist;+2 physical assistance Stand pivot transfers: Min assist;+2 physical assistance       General transfer comment: Pt requires assist to move into standing, assist to prevent knees from buckling and facilitation  for hip extension as well as assist to advance feet during transfer     Balance Overall balance assessment: Needs assistance Sitting-balance support: Feet supported Sitting balance-Leahy Scale: Poor Sitting balance - Comments: Pt with posterior LOB with UE exericse and reach.  Requires min A to regain balance  Postural control: Posterior lean Standing balance support: Bilateral upper extremity supported Standing balance-Leahy Scale: Poor Standing balance comment: Pt requires bil. UE support and assist to prevent knees from buckling as well as faciliation at hips to achieve erect posture                    ADL                           Toilet Transfer: Minimal assistance;+2 for physical assistance;Stand-pivot;BSC Toilet Transfer Details (indicate cue type and reason): Pt required assist to prevent Rt knee from buckling and and assist to advance LEs to pivot  Toileting- Clothing Manipulation and Hygiene: Total assistance       Functional mobility during ADLs: Minimal assistance;+2 for physical assistance General ADL Comments: Pt requires max encouragement for full participation but is eventually agreeable       Vision                     Perception     Praxis      Cognition   Behavior During Therapy: Flat affect Overall Cognitive Status: Within Functional Limits for tasks assessed  Extremity/Trunk Assessment               Exercises General Exercises - Upper Extremity Shoulder Flexion: AROM;AAROM;Right;Left;10 reps;Seated   Shoulder Instructions       General Comments      Pertinent Vitals/ Pain       Pain Assessment: 0-10 Pain Score: 7  Pain Location: abdomen  Pain Descriptors / Indicators: Aching;Constant Pain Intervention(s): Monitored during session;Repositioned;Patient requesting pain meds-RN notified  Home Living                                          Prior  Functioning/Environment              Frequency Min 2X/week     Progress Toward Goals  OT Goals(current goals can now be found in the care plan section)  Progress towards OT goals: Progressing toward goals  Acute Rehab OT Goals Patient Stated Goal: to get better  OT Goal Formulation: With patient Time For Goal Achievement: 08/16/14 Potential to Achieve Goals: Fair ADL Goals Pt Will Perform Grooming: with set-up;sitting Pt Will Perform Upper Body Bathing: with min assist;sitting Pt Will Perform Upper Body Dressing: with min assist;sitting Pt Will Transfer to Toilet: with mod assist;stand pivot transfer;bedside commode Pt/caregiver will Perform Home Exercise Program: Increased strength;Both right and left upper extremity;With Supervision Additional ADL Goal #1: Pt will sit EOB x 10 minutes with min guard assist in preparation for ADL at EOB. Additional ADL Goal #2: Pt will use L UE as a gross assist with bed mobility and ADL requiring B UE use. Additional ADL Goal #3: Pt will perform bed mobility with +2 min assist in preparation for ADL. Additional ADL Goal #4: Pt will participate in further visual assessment   Plan Discharge plan remains appropriate    Co-evaluation      Reason for Co-Treatment: Complexity of the patient's impairments (multi-system involvement)   OT goals addressed during session: ADL's and self-care;Strengthening/ROM      End of Session Equipment Utilized During Treatment: Oxygen   Activity Tolerance Patient limited by fatigue;Patient limited by pain   Patient Left in chair;with call bell/phone within reach   Nurse Communication Mobility status;Patient requests pain meds        Time: [id number] OT Time Calculation (min): 32 min  Charges: OT General Charges $OT Visit: 1 Procedure OT Treatments $Therapeutic Activity: 8-22 mins  Mairlyn Tegtmeyer M 08/04/2014, 1:25 PM

## 2014-08-04 NOTE — Progress Notes (Signed)
Placed patient on CPAP auto mode max 20cmH20 min 5cmH20, with 2L 02 bleed in via med full face mask.. Patient is tolerating well at this time.

## 2014-08-04 NOTE — Progress Notes (Signed)
UR COMPLETED  

## 2014-08-04 NOTE — Progress Notes (Signed)
34 Days Post-Op   Subjective: Pulled NG out and refuses for it to go back in Reports minimal abdominal pain this morning  Objective: Vital signs in last 24 hours: Temp:  [97.5 F (36.4 C)-98.6 F (37 C)] 97.8 F (36.6 C) (05/03 0335) Pulse Rate:  [88-149] 108 (05/03 0338) Resp:  [18-29] 26 (05/03 0338) BP: (74-117)/(53-77) 117/73 mmHg (05/03 0338) SpO2:  [97 %-100 %] 100 % (05/03 0338) FiO2 (%):  [100 %] 100 % (05/02 1410) Weight:  [87.2 kg (192 lb 3.9 oz)-88 kg (194 lb 0.1 oz)] 88 kg (194 lb 0.1 oz) (05/03 0500) Last BM Date: 07/31/14  Intake/Output from previous day: 05/02 0701 - 05/03 0700 In: 1450 [I.V.:120; TPN:1330] Out: 1880 [Drains:1900] Intake/Output this shift:    Abdomen soft, no peritonitis Wound with necrotic base and some fascial dehiscence  Drain with bile  Lab Results:   Recent Labs  08/03/14 0500 08/03/14 1630  WBC 11.2* 12.4*  HGB 11.3* 10.7*  HCT 35.0* 33.1*  PLT 243 121*   BMET  Recent Labs  08/02/14 1102 08/03/14 0500  NA 133* 133*  K 3.5 3.4*  CL 91* 92*  CO2 26 27  GLUCOSE 161* 138*  BUN 54* 59*  CREATININE 7.29* 6.38*  CALCIUM 8.9 8.4*   PT/INR No results for input(s): LABPROT, INR in the last 72 hours. ABG No results for input(s): PHART, HCO3 in the last 72 hours.  Invalid input(s): PCO2, PO2  Studies/Results: Dg Abd 1 View  08/03/2014   CLINICAL DATA:  NG tube placement  EXAM: ABDOMEN - 1 VIEW  COMPARISON:  07/13/2014  FINDINGS: NG tube tip is in the proximal stomach. The side port is near the GE junction.  IMPRESSION: NG tube tip in the proximal stomach.   Electronically Signed   By: Charlett Nose M.D.   On: 08/03/2014 17:18   Ct Abdomen Pelvis W Contrast  08/04/2014   CLINICAL DATA:  New epigastric abdominal pain.  EXAM: CT ABDOMEN AND PELVIS WITH CONTRAST  TECHNIQUE: Multidetector CT imaging of the abdomen and pelvis was performed using the standard protocol following bolus administration of intravenous contrast.   CONTRAST:  OMNIPAQUE IOHEXOL 300 MG/ML  SOLN  COMPARISON:  07/16/2014  FINDINGS: There is a small volume extraluminal air alongside the JP drain in the anterior abdominal midline. Source of the air is not clear. No other extraluminal air is evident in the abdomen or pelvis. No undrained fluid collection is evident alongside the JP drain. There is a 3.5 x 5.7 x 3.0 cm contained collection in the pelvic cul-de-sac which could represent a small abscess.  There is an open midline abdominal wound without associated drainable collection. There is herniation of abdominal fat through the abdominal wall musculature at the location of the wound.  There are normal appearances of the liver, spleen, pancreas, adrenals and kidneys with the exception of a 1.8 cm simple left renal cyst. There is an unchanged 3 cm infrarenal abdominal aortic aneurysm. The duodenum is draped over the aneurysm but appears intact.  There are multiple uterine fibroids.  There is no significant abnormality in the lower chest.  IMPRESSION: *Extraluminal air alongside the JP drain in the anterior abdominal midline, etiology not apparent on this scan *3.0 x 3.5 x 5.7 cm collection in the pelvic cul-de-sac *Unchanged 3 cm infrarenal abdominal aortic aneurysm directly adjacent to the duodenum *Open wound in the abdominal midline. No fluid collection near the wound.   Electronically Signed   By: Bevelyn Buckles  Clovis RileyMitchell M.D.   On: 08/04/2014 02:24   Dg Chest Port 1v Same Day  08/03/2014   CLINICAL DATA:  59 year old female dialysis patient with severe shortness of breath. Respiratory distress. Initial encounter.  EXAM: PORTABLE CHEST - 1 VIEW SAME DAY  COMPARISON:  Chest CTA 07/29/2014 and earlier.  FINDINGS: Portable AP semi upright view at 1400 hrs. Bilateral IJ approach chest catheters appears stable. Stable lung volumes. Stable cardiomegaly and mediastinal contours. No pneumothorax, pulmonary edema, pleural effusion or consolidation. No acute pulmonary  opacity identified.  IMPRESSION: No acute cardiopulmonary abnormality.   Electronically Signed   By: Odessa FlemingH  Hall M.D.   On: 08/03/2014 14:18    Anti-infectives: Anti-infectives    Start     Dose/Rate Route Frequency Ordered Stop   07/18/14 1800  vancomycin (VANCOCIN) 50 mg/mL oral solution 125 mg     125 mg Oral 4 times per day 07/18/14 1455     07/17/14 1830  vancomycin (VANCOCIN) IVPB 750 mg/150 ml premix     750 mg 150 mL/hr over 60 Minutes Intravenous  Once 07/17/14 1818 07/18/14 0024   07/15/14 1400  metroNIDAZOLE (FLAGYL) tablet 500 mg  Status:  Discontinued     500 mg Oral 3 times per day 07/15/14 1202 07/18/14 1533   07/13/14 2200  piperacillin-tazobactam (ZOSYN) IVPB 2.25 g  Status:  Discontinued     2.25 g 100 mL/hr over 30 Minutes Intravenous 3 times per day 07/13/14 1318 07/24/14 1138   07/13/14 2000  fluconazole (DIFLUCAN) IVPB 200 mg  Status:  Discontinued     200 mg 100 mL/hr over 60 Minutes Intravenous Every 24 hours 07/13/14 1318 07/19/14 1027   07/07/14 1200  vancomycin (VANCOCIN) IVPB 1000 mg/200 mL premix  Status:  Discontinued     1,000 mg 200 mL/hr over 60 Minutes Intravenous Every 24 hours 07/07/14 0937 07/14/14 1712   07/06/14 2000  fluconazole (DIFLUCAN) IVPB 400 mg  Status:  Discontinued     400 mg 100 mL/hr over 120 Minutes Intravenous Every 24 hours 07/06/14 1507 07/13/14 1318   07/06/14 1800  piperacillin-tazobactam (ZOSYN) IVPB 2.25 g  Status:  Discontinued     2.25 g 100 mL/hr over 30 Minutes Intravenous 4 times per day 07/06/14 1505 07/13/14 1318   07/05/14 2000  fluconazole (DIFLUCAN) IVPB 200 mg  Status:  Discontinued     200 mg 100 mL/hr over 60 Minutes Intravenous Every 24 hours 07/05/14 0757 07/06/14 1507   07/05/14 1400  piperacillin-tazobactam (ZOSYN) IVPB 2.25 g  Status:  Discontinued     2.25 g 100 mL/hr over 30 Minutes Intravenous 3 times per day 07/05/14 0749 07/06/14 1505   07/02/14 1800  vancomycin (VANCOCIN) IVPB 1000 mg/200 mL premix   Status:  Discontinued     1,000 mg 200 mL/hr over 60 Minutes Intravenous Every 24 hours 07/01/14 1858 07/05/14 0801   07/01/14 2200  piperacillin-tazobactam (ZOSYN) IVPB 3.375 g  Status:  Discontinued     3.375 g 100 mL/hr over 30 Minutes Intravenous 4 times per day 07/01/14 1858 07/05/14 0749   07/01/14 2000  fluconazole (DIFLUCAN) IVPB 400 mg  Status:  Discontinued     400 mg 100 mL/hr over 120 Minutes Intravenous Every 24 hours 07/01/14 1858 07/05/14 0757   07/01/14 1400  fluconazole (DIFLUCAN) IVPB 200 mg  Status:  Discontinued     200 mg 100 mL/hr over 60 Minutes Intravenous Every 24 hours 07/01/14 1330 07/01/14 1853   07/01/14 1000  piperacillin-tazobactam (ZOSYN) IVPB 2.25 g  Status:  Discontinued     2.25 g 100 mL/hr over 30 Minutes Intravenous Every 8 hours 07/01/14 0952 07/01/14 1853   07/01/14 1000  vancomycin (VANCOCIN) 500 mg in sodium chloride 0.9 % 100 mL IVPB     500 mg 100 mL/hr over 60 Minutes Intravenous  Once 07/01/14 0952 07/01/14 1126   06/30/14 1200  vancomycin (VANCOCIN) IVPB 1000 mg/200 mL premix     1,000 mg 200 mL/hr over 60 Minutes Intravenous  Once 06/30/14 0944 06/30/14 1403   06/30/14 1200  ceFEPIme (MAXIPIME) 2 g in dextrose 5 % 50 mL IVPB     2 g 100 mL/hr over 30 Minutes Intravenous  Once 06/30/14 0944 06/30/14 1425   06/30/14 0100  vancomycin (VANCOCIN) 2,000 mg in sodium chloride 0.9 % 500 mL IVPB     2,000 mg 250 mL/hr over 120 Minutes Intravenous  Once 06/30/14 0048 06/30/14 0525   06/30/14 0100  ceFEPIme (MAXIPIME) 2 g in dextrose 5 % 50 mL IVPB     2 g 100 mL/hr over 30 Minutes Intravenous  Once 06/30/14 0048 06/30/14 0322   06/27/14 0600  cefUROXime (ZINACEF) 1.5 g in dextrose 5 % 50 mL IVPB     1.5 g 100 mL/hr over 30 Minutes Intravenous On call to O.R. 06/26/14 1019 06/27/14 0630   06/19/14 1800  oseltamivir (TAMIFLU) capsule 30 mg     30 mg Oral Every M-W-F (1800) 06/19/14 1003 06/22/14 1841   06/18/14 1600  oseltamivir (TAMIFLU) capsule  30 mg  Status:  Discontinued     30 mg Oral Daily 06/18/14 1538 06/19/14 1003      Assessment/Plan: s/p Procedure(s): EXPLORATORY LAPAROTOMY WITH CLOSURE OF PERFORATED PYLORIC ULCER (N/A) SMALL BOWEL RESECTION (N/A)  Continues to be a complex situation.  She is leaking from the perforated duodenal ulcer repair site.  This has almost no hope of healing without aggressive bowel rest, NG, TNA, somatostatin, etc.   She is a very poor candidate for any attempt at surgical repair which I don't think she could tolerate.  She also refuses the NG  The CT scan was no worse.  The abscess in the pelvis has improved so I would not have IR drain or resume antibiotics  Given above, I strongly recommend palliative care.  LOS: 48 days    Mckennah Kretchmer A 08/04/2014

## 2014-08-04 NOTE — Progress Notes (Signed)
Progress Note [facility].   [name] ZOX:[id number] DOB: 1955/10/19 DOA: 06/17/2014 PCP: [name], MD  Admit HPI / Brief Narrative: 59 yo female smoker with hx HTN, ESRD on HD (just began this admit), COPD, failed L AV fistula, initially admitted 3/16 with flu and acute hypoxic resp failure. Ultimately tx to [facility] due to need for HD. 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   Patient on 3/31 was taking to the OR for ex lap for per pyloric ulcer and sm bowel resection. Patient develops septic shock on 4-1, she was started on pressors and wean off pressors on 4-3. Subsequently she was diagnosed with pelvic abscess by CT scan perform on 4-7. She underwent pelvic drain placement by IR. Patient also develops C. diff diagnosed 4-12.  C diff was not responding flagyl, and she was started on vancomycin 4-16. She also develops  run of SVT. Cardiology has been helping with management. Course complicated with high RV pressure and concerns for PE; . CT angio negative for PE. Also with fistula formation between anastomosis and blake drain, making her back to NPO state and in need of TPN. Family want everything to be done. Palliative care consulted and following. Currently Full code. Patient on 5-2 develops hypoxemia, hypotension and SVT during dialysis treatment. Dialysis was stop. She received IV bolus. She was started on BIPAP. She was transfer to Step down unit. CCM and cardiology re consulted.Her Hr decrease, BP improved. She was taken off BIPAP. She also develop new epigastric pain 5-02. Plan is to repeat CT abdomen. Surgery following.    HPI/Subjective: Alert, feeling better than yesterday. abdominal pain better.    Assessment/Plan: Acute hypoxic Respiratory Failure:  Patient became hypoxic during dialysis, hypotensive and tachycardic Hr in the 140 range,  She was started on BIPAP. Received IV bolus.  She is now off BIPAP. Chest x ray negative for pulmonary edema.  LE  doppler Ordered.  Appreciated CCM.  Nebulizer treatments.   C. difficile colitis C diff positive on 4-12.  Received 4 days of flagyl without improvement.  Will count as day 12/14 for oral vanc. Discussed with surgery ok to continue oral vancomycin.  Diarrhea  resolved.   Status post exploratory laparotomy with closure of perforated pyloric ulcer/abdominal abscess / small bowel resection w/ postop ileus and now with fistula General Surgery continues to follow Due to Fistula is back to NPO state , TPN.  Was Complaining of abdominal pain 5-02. Plan to repeat CT abdomen today.   Pelvic abscess status post percutaneous drain placement 4/8; now with leakage from anastomosis site -Ongoing care per general surgery -Received  Zosyn for 23 days. IV antibiotics discontinue by Surgery.  -Received  18 days of vancomycin and  18 days of fluconazole.  -Body fluid grew lactobacillus species.  -Will follow recommendations from [facility]; -Patient with leakage from anastomosis site needs to be NPO and back to TPN. -follow up WBC trending down.   Hypokalemia; labs pending for this morning.   Acute hypoxic respiratory failure secondary to H1 N1 influenza +/- HCAP -Has completed antibiotics.  -No accessory muscles use; requiring 2 L of oxygen  -will continue use pulmicort and continue using flutter valve/pulmonary hygiene and xopenex -CT angio negative for PE.   PSVT/concerns for pulmonary embolism given extremely high heart right side pressure -Has been evaluated by EP  -Cardiology following - follow K+ and Mg  -Continue with metoprolol and amiodarone; cardiology adjusting medications.  -2-D  echo to be repeated, with concerns for pericardial rub -Echo demonstrated extremely elevated heart right side pressure and moderately reduce function of RV -V-Q scan with intermediate probability for pulmonary embolism.  -CTA negative for PE.  -Doppler LE ordered. Work up for sepsis in process: lactic acid at 1.3,  Blood culture 5-2 pending   Small PFO Has been evaluated by Cardiology No need for closure currently  ACA CVA with Lt sided weakness Will need ongoing long term rehab - PT/OT to cont to follow while inpatient Patient remains very weak and deconditioned .   ESRD new start HD this admit  Ongoing hemodialysis per Nephrology - perm-cath placed 3/26 d/t AVF problems Electrolytes abnormalities corrected with dialysis.  Patient to have AV fistula reviewed at some point for permanent access (most likely in outpatient setting) .  Patient will need to be able to sit up for dialysis. Unable to sit today for dialysis 5-02.  Unable to complete dialysis treatment 5-2 due to hypotension, SVT.   Chronic diastolic congestive heart failure w/ Severe RHF EF 60-65%, grade 1 diastolic dysfunction. Appears compensated.  Fluids/volume manage with hemodialysis.  Concerns for PE on 2-D echo; CTA negative for PE>   Anemia of critical illness and chronic disease Hgb presently stable s/p 2U PRBC 4/13 - follow  HB follow trend.  Aranesp/iron per renal discretion   Thrombocytopenia; resolved.  Appears to be associated with sepsis plt count now within normal limits No signs of overt bleeding currently Has had mild positive HIT test during this admission   Asthma/COPD Continue oxygen supplementation as needed Will continue pulmicort Continue xopenex  Diabetes mellitus 2 Hypoglycemia in setting of NPO status. TPN wil be started today.  Holding SSI at this moment; will need re-initiation once on TNA again  Abnormal TSH -Free T4 WNL -T3 1.8 -most likely sick thyroid with ongoing infection and body stress; also due to amiodarone use. TSH in 7 range -will continue low dose synthroid given PSVT -Will change synthroid to IV.   Obesity - Inadequate oral intake related to altered GI function as evidenced by limited intake, ongoing.  Body mass index is 31.33 kg/(m^2). -patient weight trending  down -patient unable to tolerate diet and with positive leaking fistula  Code Status: FULL Family Communication: discussed with patient.  Disposition Plan: Will need long-term rehabilitation at SNF; once able to tolerate HD on recliner and  surgical issues stable.  Consultants: Cardiology Nephrology PCCM Gen Surgery  Palliative care  Significant Events: 3/16 influenza A+ 3/18 R ACA stroke 3/19 MRI multifocal acute infarction of both hemispheres, large right distal ACA 3/22 TEE severe RAE, severely reduced RV function, severe TR, no LAA thrombus, small PFO on TEE 3/31 to OR ex lap for per pyloric ulcer and sm bowel resection 4/1 septic shock on pressors 4/3 pressors weaned off  4/4 restart pressors, CRRT 4/6 levo switched to neo 4/7 pelvic abscess on CT 4/8 IR placed pelvic drain 4/9 off pressors 4/11 long run of SVT. 4/23 VQ scan which demonstrated intermediate probability for PE 4/24 CT angiogram of her chest: Pending/unable to be done due to lack of access   Antibiotics: Zosyn >stopped on 4/22 (received a total of 23 days of treatment ) Vancomycin 4-16  DVT prophylaxis: SCDs  Objective: Blood pressure 117/73, pulse 108, temperature 97.8 F (36.6 C), temperature source Axillary, resp. rate 26, height 5\' 6"  (1.676 m), weight 88 kg (194 lb 0.1 oz), SpO2 100 %.  Intake/Output Summary (Last 24 hours) at 08/04/14  [id number] Last data filed at 08/04/14 0600  Gross per 24 hour  Intake   1450 ml  Output    980 ml  Net    470 ml   Exam: General: NAD.  Lungs: bilateral wheezing, no increase work of breathing. Cardiovascular: Regular rate, no murmurs and no gallops   Abdomen: Abdominal wound dressing, clean and dry; abdomen slightly distended, drain intact and with /greenigh drainage; patient reports mild pain with palpation;  Extremities: no edema   Data Reviewed: Basic Metabolic Panel:  Recent Labs Lab 07/29/14 0415 07/30/14 0505 07/31/14 1050 08/01/14 0538  08/02/14 0519 08/02/14 1102 08/03/14 0500  NA 134* 138 137 137 132* 133* 133*  K 4.0 3.3* 3.0* 2.8* 3.2* 3.5 3.4*  CL 94* 99 94* 95* 91* 91* 92*  CO2 25 25 30 30 29 26 27   GLUCOSE 55* 138* 131* 183* 180* 161* 138*  BUN 29* 14 36* 23 47* 54* 59*  CREATININE 6.65* 5.20* 8.44* 4.79* 6.50* 7.29* 6.38*  CALCIUM 8.4 8.6 8.8 8.8 8.6* 8.9 8.4*  MG 2.0 2.0  --  1.9  --   --  1.4*  PHOS 6.6* 3.6  --  1.2* 2.3* 1.7* 1.7*    Liver Function Tests:  Recent Labs Lab 07/29/14 0415 07/30/14 0505 08/01/14 0538 08/02/14 0519 08/02/14 1102 08/03/14 0500  AST 41* 36  --   --   --  29  ALT 18 20  --   --   --  17  ALKPHOS 137* 146*  --   --   --  148*  BILITOT 1.2 0.8  --   --   --  1.1  PROT 6.2 7.7  --   --   --  7.3  ALBUMIN 1.8* 2.1* 2.0* 2.0* 2.1* 2.0*   CBC:  Recent Labs Lab 07/29/14 2047  08/01/14 0538 08/02/14 0518 08/02/14 1102 08/03/14 0500 08/03/14 1630  WBC 11.1*  < > 13.1* 14.4* 12.8* 11.2* 12.4*  NEUTROABS 8.0*  --   --   --   --  7.8*  --   HGB 9.7*  < > 10.6* 10.9* 12.3 11.3* 10.7*  HCT 31.4*  < > 35.4* 35.0* 38.9 35.0* 33.1*  MCV 92.6  < > 94.1 92.1 92.0 90.7 89.9  PLT 342  < > 307 277 258 243 121*  < > = values in this interval not displayed.  CBG:  Recent Labs Lab 08/03/14 0614 08/03/14 0800 08/03/14 1805 08/03/14 2338 08/04/14 0529  GLUCAP 151* 151* 150* 143* 149*    Scheduled Meds:  Scheduled Meds: . amiodarone  400 mg Oral BID  . budesonide (PULMICORT) nebulizer solution  0.25 mg Nebulization BID  . darbepoetin (ARANESP) injection - DIALYSIS  100 mcg Intravenous Q Mon-HD  . insulin aspart  0-9 Units Subcutaneous 4 times per day  . levothyroxine  25 mcg Oral QAC breakfast  . LORazepam  0.25 mg Intravenous Once  . magnesium sulfate 1 - 4 g bolus IVPB  2 g Intravenous Once  . metoprolol tartrate  12.5 mg Oral BID  . midodrine  10 mg Oral TID WC  . sodium chloride  10-40 mL Intracatheter Q12H  . sodium phosphate  Dextrose 5% IVPB  30 mmol  Intravenous Once  . vancomycin  125 mg Oral 4 times per day    Time spent on care of this patient: 35 minutes   Alba Coryegalado, Ariel Dimitri A , MD  [phone] [facility] Office  [phone] Pager - Text Page per Loretha [email or web address]  as per below:  On-Call/Text Page:      [email or web address]      password [email or web address]  If 7PM-7AM, please contact night-coverage [email or web address] Password [email or web address] 08/04/2014, 8:16 AM   LOS: 48 days

## 2014-08-04 NOTE — Progress Notes (Signed)
Patient ZO:[id number] C [name]      DOB: [birth year]      EAV:[id number]   Palliative Medicine Team at [facility] Progress Note    Subjective: [name] recalls things not going well at dialysis. Wishes she could drink.  Has some pains in abdomen.     Filed Vitals:   08/04/14 0729  BP: 119/66  Pulse: 102  Temp:   Resp: 23   Physical exam: GEN: alert,  CV: Tachy ABD: soft EXT: warm  CBC    Component Value Date/Time   WBC 12.4* 08/03/2014 1630   WBC 6.8 10/02/2012 1046   RBC 3.68* 08/03/2014 1630   RBC 4.87 10/02/2012 1046   HGB 10.7* 08/03/2014 1630   HGB 13.1 10/02/2012 1046   HCT 33.1* 08/03/2014 1630   HCT 40.3 10/02/2012 1046   PLT 121* 08/03/2014 1630   PLT 359 10/02/2012 1046   MCV 89.9 08/03/2014 1630   MCV 82.6 10/02/2012 1046   MCH 29.1 08/03/2014 1630   MCH 26.8 10/02/2012 1046   MCHC 32.3 08/03/2014 1630   MCHC 32.5 10/02/2012 1046   RDW 23.2* 08/03/2014 1630   RDW 17.4* 10/02/2012 1046   LYMPHSABS 1.5 08/03/2014 0500   LYMPHSABS 1.9 10/02/2012 1046   MONOABS 1.6* 08/03/2014 0500   MONOABS 0.8 10/02/2012 1046   EOSABS 0.2 08/03/2014 0500   EOSABS 0.2 10/02/2012 1046   BASOSABS 0.1 08/03/2014 0500   BASOSABS 0.1 10/02/2012 1046    CMP     Component Value Date/Time   NA 131* 08/04/2014 0754   NA 139 10/02/2012 1046   K 3.4* 08/04/2014 0754   K 4.4 10/02/2012 1046   CL 91* 08/04/2014 0754   CO2 28 08/04/2014 0754   CO2 25 10/02/2012 1046   GLUCOSE 137* 08/04/2014 0754   GLUCOSE 110 10/02/2012 1046   BUN 53* 08/04/2014 0754   BUN 35.2* 10/02/2012 1046   CREATININE 5.73* 08/04/2014 0754   CREATININE 2.5* 10/02/2012 1046   CALCIUM 8.4* 08/04/2014 0754   CALCIUM 9.8 10/02/2012 1046   PROT 7.3 08/03/2014 0500   PROT 8.4* 10/02/2012 1046   ALBUMIN 2.1* 08/04/2014 0754   ALBUMIN 3.1* 10/02/2012 1046   AST 29 08/03/2014 0500   AST 9 10/02/2012 1046   ALT 17 08/03/2014 0500   ALT <6 Repeated and Verified 10/02/2012 1046   ALKPHOS 148* 08/03/2014  0500   ALKPHOS 108 10/02/2012 1046   BILITOT 1.1 08/03/2014 0500   BILITOT 0.21 10/02/2012 1046   GFRNONAA 7* 08/04/2014 0754   GFRAA 9* 08/04/2014 0754     Assessment and plan: 59 yo female with multiple medical problems including ESRD on HD, COPD, C diff, SVT, perforated ulcer, ischemic ileum, enteric fistula requiring TPN, pelvic abscess. Palliative consulted for GOC.   1. Code Status- Full Code, see discussion from Dr Debby Bud  2. GOC- See prior documentation  I think she is growing increasingly frustrated with hospital stay which is now 48 days. I frankly asked her if this was "getting to be too much".  She tells me no and that she does not wish to transition towards a comfort based focus. She tells me that she will let us know.  I suspect she does not have a great understanding of just how difficult the road is from here and how limited her recovery may be. Difficult to engage in this conversation as well.  I attempted to reach her daughter this morning but no answer. [name] states that she is sick currently as  well.   3. Symptom Management 1. Abdominal Pain- Was placed on scheduled very low dose dilaudid. With events last night, transitioned Fentanyl PRN. I think fentanyl is probably safest PRN opioid choice.    4. Psychosocial/Spiritual. Has 2 children. Lived at home with her daughter [name]. Son also involved in her care. Enjoys watching baseball. Spirituality very important to her.    Total Time: 15 minutes >50% of time spent in counseling and coordination of care regarding above.   Orvis BrillAaron J. Alexei Doswell D.O. [facility]  Pager: [phone] Team Phone: [phone]

## 2014-08-04 NOTE — Progress Notes (Signed)
[facility] Progress Note   Subjective: no more problems overnight  Filed Vitals:   08/04/14 0700 08/04/14 0729 08/04/14 0919 08/04/14 0930  BP:  119/66    Pulse:  102    Temp: 97.7 F (36.5 C)     TempSrc: Oral     Resp:  23    Height:      Weight:      SpO2:  100% 95% 100%   Exam: Up in chair, alert No jvd Chest clear bilat Abd soft VAC midline ND Ext no LE edema Access is R IJ cath and left arm AVF +bruit Neuro is ox 3  HD: new start, on MWF schedule in hospital        Assessment: 1. ESRD new start, cont MWF HD. Using HD cath, AVF poor fxn 2. CVA w left hemiparesis 3. SVT - cardiology following, intermittent issue 4. S/P exlap for perf pyloric ulcer/ ischemia ileum - now w duodenal fistula/ VAC drain 5. Pelvic fluid collection - resolved after IR drain 6. 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 S/P H1N1 flu 9. 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 No hep HD for now 10. DM2 11. 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 Hypotension on midodrine 13. Hypokalemia - replace IV 14. Hypomagnesemia - didn't get the 2gm ordered yest, will reorder 15. Nutrition -NG out, refusing replacement; not able to take po meds now  Plan - HD tomorrow, 4K bath    Vinson Moselleob Zyire Eidson MD  pager [phone]    cell [phone]  08/04/2014, 12:34 PM     Recent Labs Lab 08/02/14 1102 08/03/14 0500 08/04/14 0754  NA 133* 133* 131*  K 3.5 3.4* 3.4*  CL 91* 92* 91*  CO2 26 27 28   GLUCOSE 161* 138* 137*  BUN 54* 59* 53*  CREATININE 7.29* 6.38* 5.73*  CALCIUM 8.9 8.4* 8.4*  PHOS 1.7* 1.7* 2.0*    Recent Labs Lab 07/29/14 0415 07/30/14 0505  08/02/14 1102 08/03/14 0500 08/04/14 0754  AST 41* 36  --   --  29  --   ALT 18 20  --   --  17  --   ALKPHOS 137* 146*  --   --  148*  --   BILITOT 1.2 0.8  --   --  1.1  --   PROT 6.2 7.7  --   --  7.3  --   ALBUMIN 1.8* 2.1*  < > 2.1* 2.0* 2.1*  < > = values in this interval not displayed.  Recent  Labs Lab 07/29/14 2047  08/03/14 0500 08/03/14 1630 08/04/14 0958  WBC 11.1*  < > 11.2* 12.4* 14.0*  NEUTROABS 8.0*  --  7.8*  --   --   HGB 9.7*  < > 11.3* 10.7* 9.6*  HCT 31.4*  < > 35.0* 33.1* 30.6*  MCV 92.6  < > 90.7 89.9 90.5  PLT 342  < > 243 121* 255  < > = values in this interval not displayed. . budesonide (PULMICORT) nebulizer solution  0.25 mg Nebulization BID  . darbepoetin (ARANESP) injection - DIALYSIS  100 mcg Intravenous Q Mon-HD  . insulin aspart  0-9 Units Subcutaneous 4 times per day  . levalbuterol  0.63 mg Nebulization Q6H  . levothyroxine  12.5 mcg Intravenous Daily  . LORazepam  0.25 mg Intravenous Once  . magnesium sulfate 1 - 4 g bolus IVPB  2 g  Intravenous Once  . metoprolol  2.5 mg Intravenous 3 times per day  . sodium chloride  10-40 mL Intracatheter Q12H  . sodium phosphate  Dextrose 5% IVPB  30 mmol Intravenous Once  . vancomycin  125 mg Oral 4 times per day   . sodium chloride 10 mL/hr at 08/03/14 1925  . TPN (CLINIMIX) Adult without lytes 100 mL/hr at 08/04/14 0000   And  . fat emulsion 240 mL (08/04/14 0600)   bisacodyl, diphenhydrAMINE, fentaNYL (SUBLIMAZE) injection, ondansetron (ZOFRAN) IV, sodium chloride

## 2014-08-04 NOTE — Progress Notes (Signed)
PARENTERAL NUTRITION CONSULT NOTE - Follow-up  Pharmacy Consult:  TPN Indication:  Duodenal fistula  No Known Allergies   Patient Measurements: Height: 5\' 6"  (167.6 cm) Weight: 194 lb 0.1 oz (88 kg) IBW/kg (Calculated) : 59.3  Adjusted body weight: 67 kg  Vital Signs: Temp: 97.7 F (36.5 C) (05/03 0700) Temp Source: Oral (05/03 0700) BP: 119/66 mmHg (05/03 0729) Pulse Rate: 102 (05/03 0729) Intake/Output from previous day: 05/02 0701 - 05/03 0700 In: 1450 [I.V.:120; TPN:1330] Out: 1880 [Drains:1900]  Labs:  Recent Labs  08/03/14 0500 08/03/14 1630 08/04/14 0958  WBC 11.2* 12.4* 14.0*  HGB 11.3* 10.7* 9.6*  HCT 35.0* 33.1* 30.6*  PLT 243 121* 255     Recent Labs  08/02/14 1102 08/03/14 0500 08/04/14 0754  NA 133* 133* 131*  K 3.5 3.4* 3.4*  CL 91* 92* 91*  CO2 26 27 28   GLUCOSE 161* 138* 137*  BUN 54* 59* 53*  CREATININE 7.29* 6.38* 5.73*  CALCIUM 8.9 8.4* 8.4*  MG  --  1.4* 1.4*  PHOS 1.7* 1.7* 2.0*  PROT  --  7.3  --   ALBUMIN 2.1* 2.0* 2.1*  AST  --  29  --   ALT  --  17  --   ALKPHOS  --  148*  --   BILITOT  --  1.1  --   PREALBUMIN  --  9.8*  --   TRIG  --  177*  --    Estimated Creatinine Clearance: 12 mL/min (by C-G formula based on Cr of 5.73).    Recent Labs  08/03/14 2338 08/04/14 0529 08/04/14 1219  GLUCAP 143* 149* 169*   Insulin Requirements in the past 24 hours: 2 units Novolog SSI  Assessment: 30 YOF with complicated hospital course. S/p closure perforated pyloric ulcer, SBR for ischemic necrosis, diffuse peritonitis on 07/01/14. Pharmacy consulted to provide TPN 4/1-4/15 for nutrition support for prolonged ileus. Pt then transitioned to po diet + supplements. 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  GI: Duodenal fistula. NPO. On TPN. Drain o/p 1900 ml/24h. Albumin 2.1. Prealbumin remains low at 9.8 Noted palliative care discussions ongoing but for now pt/family wish for aggressive care. NGT  out and pt refuses replacement. TPN stopped 5/2 at 1440 during rapid response event per Dr Jonnie Finner request due to volume overload.  Now TPN running at 100 ml/hr and lipids at 10 ml/hr.   Endo: h/o DM. CBGs marginal, minimal SSI, had low CBGs prior to TPN start.   Lytes: none in TPN since pt on iHD. K 3.4  after  4 K bath on HD for about 3 hours (goal >/=4 with afib) MD has ordered 4 runs of K, Phos 2 did not get  33mM Na phos ordered yesterday - OK to reorder per D Zeyfang;  Mg 1.4 did not get 2 gm of mag ordered yesterday, MD ordered 2 gm today(goal >/= 2 with afib), Ca 8.4 CoCa 10  Renal: New ESRD - started 3/17. HD on M/W/F.  Went to HD x 2 yesterday (did not tolerate in recliner) and got a total of ~ 3 hrs HD 5/2. Marland Kitchen Aranesp 150 qMonday due 5/2, Hg 11.3, was 12.2 5/1; d/w Zeyfang, decrease dose to 100 qMon, now Hg 9.6  Pulm: asthma, COPD. 2L [place]. CT negative for PE.  Cards: HTN.  (afib). Cards signed off 4/26. Midodrine and metoprolol, amio on hold (no NGT and NPO) . CHADS-VASc =4 (not good  candidate for [facility]).  Hepatotobil: Alk phos slightly elevated, other LFTs wnl. TG 177  Neuro: hx depression / insomnia - new stroke noted on 3/18.   ID: Pt continues on oral vanc D#12/14 for cdiff colitis. Diarrhea is better.  Best Practices: SCDs, PPI IV  TPN Access: PICC line in IR 4/27  TPN start date: 4/1->4/15; restart 4/27>>  Current Nutrition:  NPO Clinimix 5/15 (no electrolytes) at 167ml/hr and 20% IVFE at 43ml/hr which will provide 1843 kcal and 96 gm protein.  Nutritional Goals: (per RD note 4/28) 2100-2300 kCal, 120-140 grams of protein per day Goal: Clinimix 5/15 at 115ml/hr + 20% IVFE 60ml/hr to provide 2184 kcal and 120 gm protein  Plan:  - continue Clinimix 5/15 (no electrolytes) at 100/hr and 20% IVFE at 54ml/hr. This will provide 100% support.  -Replete mag with 2 gm, replete phos with 57mM Na phos, 4 runs of K per MD - Trace elements and MVI daily in TPN.  - Continue  sensitive SSI q6h  Eudelia Bunch, Pharm.D. [phone] 08/04/2014 12:53 PM

## 2014-08-04 NOTE — Progress Notes (Signed)
Physical Therapy Treatment Patient Details Name: [name] MRN: [id number] DOB: 06/09/1955 Today's Date: 08/04/2014    History of Present Illness [name] is a 59 y.o. female with a history of CKD and hypertension [facility]16 complaining of shortness of breath and generalized weakness x 2 weeks. Admitted with acute hypoxemic respiratory failure. During hospital stay noted to have weakness of her left side. 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  She developed septic shock 4/1 with pressors weaned off 4/3.  Began CRRT and pressors restarted 4/4; Developed pelvic abcess 4/7 with pelvic drain placed 4/8.  weaned off pressors 4/9.  Runs of SVT 4/5 and 4/11    PT Comments    Pt has made slow progress.  Emphasis on exercise in sitting , standing trial with work on upright stance and support on L knee.   Follow Up Recommendations  SNF;Supervision/Assistance - 24 hour     Equipment Recommendations  Other (comment) (TBA next venue)    Recommendations for Other Services       Precautions / Restrictions Precautions Precautions: Fall Precaution Comments: JP drain attached to wall suction     Mobility  Bed Mobility Overal bed mobility: Needs Assistance Bed Mobility: Rolling;Sidelying to Sit Rolling: Min assist Sidelying to sit: Min assist       General bed mobility comments: assist to move LEs to EOB and assist to lift trunk   Transfers Overall transfer level: Needs assistance Equipment used: 2 person hand held assist Transfers: Sit to/from BJ'sStand;Stand Pivot Transfers Sit to Stand: Min assist;+2 physical assistance Stand pivot transfers: Min assist;+2 physical assistance       General transfer comment: Pt requires assist to move into standing, assist to prevent knees from buckling and facilitation for hip extension as well as  assist to advance feet during transfer   Ambulation/Gait             General Gait Details: unable   Stairs            Wheelchair Mobility    Modified Rankin (Stroke Patients Only) Modified Rankin (Stroke Patients Only) Pre-Morbid Rankin Score: Slight disability Modified Rankin: Severe disability     Balance Overall balance assessment: Needs assistance Sitting-balance support: Feet supported;Single extremity supported Sitting balance-Leahy Scale: Poor Sitting balance - Comments: Pt with posterior LOB with UE exericse and reach.  Requires min A to regain balance  Postural control: Posterior lean Standing balance support: No upper extremity supported Standing balance-Leahy Scale: Poor Standing balance comment: Pt requires bil. UE support and assist to prevent knees from buckling as well as faciliation at hips to achieve erect posture                     Cognition Arousal/Alertness: Awake/alert Behavior During Therapy: Flat affect Overall Cognitive Status: Within Functional Limits for tasks assessed Area of Impairment: Attention   Current Attention Level: Selective           General Comments: Pt able to follow one and two step commands consistently. Cognition appears [facility] for basic tasks     Exercises General Exercises - Upper Extremity Shoulder Flexion: AROM;AAROM;Right;Left;10 reps;Seated Other Exercises Other Exercises: warm up A/AAROM to LE's    General Comments        Pertinent Vitals/Pain Pain Assessment: 0-10 Pain Score: 7  Faces Pain  Scale: Hurts even more Pain Location: abdomen Pain Descriptors / Indicators: Aching;Constant Pain Intervention(s): Monitored during session;Repositioned;Patient requesting pain meds-RN notified    Home Living                      Prior Function            PT Goals (current goals can now be found in the care plan section) Acute Rehab PT Goals Patient Stated Goal: to get better  PT Goal  Formulation: With patient Time For Goal Achievement: 08/12/14 Potential to Achieve Goals: Fair (pt self limiting) Progress towards PT goals: Progressing toward goals (little progress made)    Frequency  Min 3X/week    PT Plan Current plan remains appropriate    Co-evaluation PT/OT/SLP Co-Evaluation/Treatment: Yes Reason for Co-Treatment: Complexity of the patient's impairments (multi-system involvement) PT goals addressed during session: Mobility/safety with mobility OT goals addressed during session: ADL's and self-care;Strengthening/ROM     End of Session   Activity Tolerance: Patient tolerated treatment well Patient left: in chair;with call bell/phone within reach;with chair alarm set     Time: 7829-5621 PT Time Calculation (min) (ACUTE ONLY): 32 min  Charges:  $Therapeutic Activity: 8-22 mins                    G Codes:      [name], [name] 08/04/2014, 4:41 PM 08/04/2014  [place] Bing, PT [phone]  (pager)

## 2014-08-04 NOTE — Progress Notes (Signed)
Bilateral lower extremity venous duplex completed:  No evidence of DVT, superficial thrombosis, or Baker's cyst.   

## 2014-08-05 LAB — BASIC METABOLIC PANEL
ANION GAP: 17 — AB (ref 5–15)
BUN: 81 mg/dL — ABNORMAL HIGH (ref 6–20)
CO2: 25 mmol/L (ref 22–32)
Calcium: 8.4 mg/dL — ABNORMAL LOW (ref 8.9–10.3)
Chloride: 88 mmol/L — ABNORMAL LOW (ref 101–111)
Creatinine, Ser: 7.27 mg/dL — ABNORMAL HIGH (ref 0.44–1.00)
GFR calc Af Amer: 6 mL/min — ABNORMAL LOW (ref >60)
GFR calc non Af Amer: 6 mL/min — ABNORMAL LOW (ref >60)
Glucose, Bld: 156 mg/dL — ABNORMAL HIGH (ref 70–99)
Potassium: 3.4 mmol/L — ABNORMAL LOW (ref 3.5–5.1)
SODIUM: 130 mmol/L — AB (ref 135–145)

## 2014-08-05 LAB — RENAL FUNCTION PANEL
ALBUMIN: 2 g/dL — AB (ref 3.5–5.0)
Anion gap: 16 — ABNORMAL HIGH (ref 5–15)
BUN: 80 mg/dL — ABNORMAL HIGH (ref 6–20)
CO2: 26 mmol/L (ref 22–32)
CREATININE: 7.28 mg/dL — AB (ref 0.44–1.00)
Calcium: 8.4 mg/dL — ABNORMAL LOW (ref 8.9–10.3)
Chloride: 88 mmol/L — ABNORMAL LOW (ref 101–111)
GFR calc non Af Amer: 6 mL/min — ABNORMAL LOW (ref >60)
GFR, EST AFRICAN AMERICAN: 6 mL/min — AB (ref >60)
Glucose, Bld: 156 mg/dL — ABNORMAL HIGH (ref 70–99)
PHOSPHORUS: 4.4 mg/dL (ref 2.5–4.6)
Potassium: 3.3 mmol/L — ABNORMAL LOW (ref 3.5–5.1)
SODIUM: 130 mmol/L — AB (ref 135–145)

## 2014-08-05 LAB — CBC
HCT: 28.9 % — ABNORMAL LOW (ref 36.0–46.0)
Hemoglobin: 9.3 g/dL — ABNORMAL LOW (ref 12.0–15.0)
MCH: 28.3 pg (ref 26.0–34.0)
MCHC: 32.2 g/dL (ref 30.0–36.0)
MCV: 87.8 fL (ref 78.0–100.0)
PLATELETS: 285 10*3/uL (ref 150–400)
RBC: 3.29 MIL/uL — ABNORMAL LOW (ref 3.87–5.11)
RDW: 22.1 % — AB (ref 11.5–15.5)
WBC: 16.3 10*3/uL — AB (ref 4.0–10.5)

## 2014-08-05 LAB — GLUCOSE, CAPILLARY
GLUCOSE-CAPILLARY: 157 mg/dL — AB (ref 70–99)
GLUCOSE-CAPILLARY: 146 mg/dL — AB (ref 70–99)
Glucose-Capillary: 134 mg/dL — ABNORMAL HIGH (ref 70–99)
Glucose-Capillary: 131 mg/dL — ABNORMAL HIGH (ref 70–99)

## 2014-08-05 LAB — MAGNESIUM: Magnesium: 1.8 mg/dL (ref 1.7–2.4)

## 2014-08-05 LAB — PHOSPHORUS: Phosphorus: 4.4 mg/dL (ref 2.5–4.6)

## 2014-08-05 MED ORDER — TRACE MINERALS CR-CU-F-FE-I-MN-MO-SE-ZN IV SOLN
INTRAVENOUS | Status: AC
  Administered 2014-08-05: 17:00:00 via INTRAVENOUS

## 2014-08-05 MED ORDER — MAGNESIUM SULFATE IN D5W 10-5 MG/ML-% IV SOLN
1.0000 g | Freq: Once | INTRAVENOUS | Status: AC
  Administered 2014-08-05: 1 g via INTRAVENOUS
  Filled 2014-08-05 (×2): qty 100

## 2014-08-05 MED ORDER — FAT EMULSION 20 % IV EMUL
240.0000 mL | INTRAVENOUS | Status: AC
  Administered 2014-08-05: 240 mL via INTRAVENOUS

## 2014-08-05 MED ORDER — LORAZEPAM 2 MG/ML IJ SOLN
INTRAMUSCULAR | Status: AC
  Filled 2014-08-05: qty 1

## 2014-08-05 MED ORDER — INSULIN ASPART 100 UNIT/ML ~~LOC~~ SOLN: 4.0000 [IU] | Freq: Once | SUBCUTANEOUS | Status: DC

## 2014-08-05 MED ORDER — FENTANYL CITRATE (PF) 100 MCG/2ML IJ SOLN
INTRAMUSCULAR | Status: AC
  Filled 2014-08-05: qty 2

## 2014-08-05 NOTE — Progress Notes (Signed)
Pt ABD drainage green, clear, MD called, nursing will cont to monitor

## 2014-08-05 NOTE — Progress Notes (Signed)
[name], female   DOB: July 17, 1955, 59 y.o.   MRN: [id number] 35 Days Post-Op  Subjective: Pt c/o some abdominal soreness, but not as bad as the last couple of days.  Thirsty    Objective: Vital signs in last 24 hours: Temp:  [97.5 F (36.4 C)-98.5 F (36.9 C)] 98.1 F (36.7 C) (05/04 0745) Pulse Rate:  [105-114] 108 (05/04 0752) Resp:  [16-28] 28 (05/04 0745) BP: (89-96)/(54-71) 96/71 mmHg (05/04 0752) SpO2:  [92 %-100 %] 96 % (05/04 0745) Weight:  [77.5 kg (170 lb 13.7 oz)-87.998 kg (194 lb)] 77.5 kg (170 lb 13.7 oz) (05/04 0745) Last BM Date: 07/31/14  Intake/Output from previous day: 05/03 0701 - 05/04 0700 In: 2720 [I.V.:160; TPN:2530] Out: 1300 [Drains:1300] Intake/Output this shift:    PE: Abd: soft, mildly tender, JP drain with bilious output, wound is packed.  Base of wound is still not very clean with some necrotic fat and some separation of her sutures.  Lab Results:   Recent Labs  08/04/14 0958 08/05/14 0400  WBC 14.0* 16.3*  HGB 9.6* 9.3*  HCT 30.6* 28.9*  PLT 255 285   BMET  Recent Labs  08/04/14 0754 08/05/14 0400  NA 131* 130*  130*  K 3.4* 3.4*  3.3*  CL 91* 88*  88*  CO2 28 25  26   GLUCOSE 137* 156*  156*  BUN 53* 81*  80*  CREATININE 5.73* 7.27*  7.28*  CALCIUM 8.4* 8.4*  8.4*   PT/INR No results for input(s): LABPROT, INR in the last 72 hours. CMP     Component Value Date/Time   NA 130* 08/05/2014 0400   NA 130* 08/05/2014 0400   NA 139 10/02/2012 1046   K 3.3* 08/05/2014 0400   K 3.4* 08/05/2014 0400   K 4.4 10/02/2012 1046   CL 88* 08/05/2014 0400   CL 88* 08/05/2014 0400   CO2 26 08/05/2014 0400   CO2 25 08/05/2014 0400   CO2 25 10/02/2012 1046   GLUCOSE 156* 08/05/2014 0400   GLUCOSE 156* 08/05/2014 0400   GLUCOSE 110 10/02/2012 1046   BUN 80* 08/05/2014 0400   BUN 81* 08/05/2014 0400   BUN 35.2* 10/02/2012 1046   CREATININE 7.28* 08/05/2014 0400   CREATININE 7.27* 08/05/2014 0400    CREATININE 2.5* 10/02/2012 1046   CALCIUM 8.4* 08/05/2014 0400   CALCIUM 8.4* 08/05/2014 0400   CALCIUM 9.8 10/02/2012 1046   PROT 7.3 08/03/2014 0500   PROT 8.4* 10/02/2012 1046   ALBUMIN 2.0* 08/05/2014 0400   ALBUMIN 3.1* 10/02/2012 1046   AST 29 08/03/2014 0500   AST 9 10/02/2012 1046   ALT 17 08/03/2014 0500   ALT <6 Repeated and Verified 10/02/2012 1046   ALKPHOS 148* 08/03/2014 0500   ALKPHOS 108 10/02/2012 1046   BILITOT 1.1 08/03/2014 0500   BILITOT 0.21 10/02/2012 1046   GFRNONAA 6* 08/05/2014 0400   GFRNONAA 6* 08/05/2014 0400   GFRAA 6* 08/05/2014 0400   GFRAA 6* 08/05/2014 0400   Lipase  No results found for: LIPASE     Studies/Results: Dg Abd 1 View  08/03/2014   CLINICAL DATA:  NG tube placement  EXAM: ABDOMEN - 1 VIEW  COMPARISON:  07/13/2014  FINDINGS: NG tube tip is in the proximal stomach. The side port is near the GE junction.  IMPRESSION: NG tube tip in the proximal stomach.   Electronically Signed   By: Charlett NoseKevin  Dover M.D.   On: 08/03/2014 17:18   Ct  Abdomen Pelvis W Contrast  08/04/2014   CLINICAL DATA:  New epigastric abdominal pain.  EXAM: CT ABDOMEN AND PELVIS WITH CONTRAST  TECHNIQUE: Multidetector CT imaging of the abdomen and pelvis was performed using the standard protocol following bolus administration of intravenous contrast.  CONTRAST:  OMNIPAQUE IOHEXOL 300 MG/ML  SOLN  COMPARISON:  07/16/2014  FINDINGS: There is a small volume extraluminal air alongside the JP drain in the anterior abdominal midline. Source of the air is not clear. No other extraluminal air is evident in the abdomen or pelvis. No undrained fluid collection is evident alongside the JP drain. There is a 3.5 x 5.7 x 3.0 cm contained collection in the pelvic cul-de-sac which could represent a small abscess.  There is an open midline abdominal wound without associated drainable collection. There is herniation of abdominal fat through the abdominal wall musculature at the location of the  wound.  There are normal appearances of the liver, spleen, pancreas, adrenals and kidneys with the exception of a 1.8 cm simple left renal cyst. There is an unchanged 3 cm infrarenal abdominal aortic aneurysm. The duodenum is draped over the aneurysm but appears intact.  There are multiple uterine fibroids.  There is no significant abnormality in the lower chest.  IMPRESSION: *Extraluminal air alongside the JP drain in the anterior abdominal midline, etiology not apparent on this scan *3.0 x 3.5 x 5.7 cm collection in the pelvic cul-de-sac *Unchanged 3 cm infrarenal abdominal aortic aneurysm directly adjacent to the duodenum *Open wound in the abdominal midline. No fluid collection near the wound.   Electronically Signed   By: Ellery Plunk M.D.   On: 08/04/2014 02:24   Dg Chest Port 1v Same Day  08/03/2014   CLINICAL DATA:  59 year old female dialysis patient with severe shortness of breath. Respiratory distress. Initial encounter.  EXAM: PORTABLE CHEST - 1 VIEW SAME DAY  COMPARISON:  Chest CTA 07/29/2014 and earlier.  FINDINGS: Portable AP semi upright view at 1400 hrs. Bilateral IJ approach chest catheters appears stable. Stable lung volumes. Stable cardiomegaly and mediastinal contours. No pneumothorax, pulmonary edema, pleural effusion or consolidation. No acute pulmonary opacity identified.  IMPRESSION: No acute cardiopulmonary abnormality.   Electronically Signed   By: Odessa Fleming M.D.   On: 08/03/2014 14:18    Anti-infectives: Anti-infectives    Start     Dose/Rate Route Frequency Ordered Stop   08/04/14 1200  vancomycin (VANCOCIN) 50 mg/mL oral solution 125 mg     125 mg Oral 4 times per day 08/04/14 0851     07/18/14 1800  vancomycin (VANCOCIN) 50 mg/mL oral solution 125 mg  Status:  Discontinued     125 mg Oral 4 times per day 07/18/14 1455 08/04/14 0845   07/17/14 1830  vancomycin (VANCOCIN) IVPB 750 mg/150 ml premix     750 mg 150 mL/hr over 60 Minutes Intravenous  Once 07/17/14 1818  07/18/14 0024   07/15/14 1400  metroNIDAZOLE (FLAGYL) tablet 500 mg  Status:  Discontinued     500 mg Oral 3 times per day 07/15/14 1202 07/18/14 1533   07/13/14 2200  piperacillin-tazobactam (ZOSYN) IVPB 2.25 g  Status:  Discontinued     2.25 g 100 mL/hr over 30 Minutes Intravenous 3 times per day 07/13/14 1318 07/24/14 1138   07/13/14 2000  fluconazole (DIFLUCAN) IVPB 200 mg  Status:  Discontinued     200 mg 100 mL/hr over 60 Minutes Intravenous Every 24 hours 07/13/14 1318 07/19/14 1027   07/07/14 1200  vancomycin (VANCOCIN) IVPB 1000 mg/200 mL premix  Status:  Discontinued     1,000 mg 200 mL/hr over 60 Minutes Intravenous Every 24 hours 07/07/14 0937 07/14/14 1712   07/06/14 2000  fluconazole (DIFLUCAN) IVPB 400 mg  Status:  Discontinued     400 mg 100 mL/hr over 120 Minutes Intravenous Every 24 hours 07/06/14 1507 07/13/14 1318   07/06/14 1800  piperacillin-tazobactam (ZOSYN) IVPB 2.25 g  Status:  Discontinued     2.25 g 100 mL/hr over 30 Minutes Intravenous 4 times per day 07/06/14 1505 07/13/14 1318   07/05/14 2000  fluconazole (DIFLUCAN) IVPB 200 mg  Status:  Discontinued     200 mg 100 mL/hr over 60 Minutes Intravenous Every 24 hours 07/05/14 0757 07/06/14 1507   07/05/14 1400  piperacillin-tazobactam (ZOSYN) IVPB 2.25 g  Status:  Discontinued     2.25 g 100 mL/hr over 30 Minutes Intravenous 3 times per day 07/05/14 0749 07/06/14 1505   07/02/14 1800  vancomycin (VANCOCIN) IVPB 1000 mg/200 mL premix  Status:  Discontinued     1,000 mg 200 mL/hr over 60 Minutes Intravenous Every 24 hours 07/01/14 1858 07/05/14 0801   07/01/14 2200  piperacillin-tazobactam (ZOSYN) IVPB 3.375 g  Status:  Discontinued     3.375 g 100 mL/hr over 30 Minutes Intravenous 4 times per day 07/01/14 1858 07/05/14 0749   07/01/14 2000  fluconazole (DIFLUCAN) IVPB 400 mg  Status:  Discontinued     400 mg 100 mL/hr over 120 Minutes Intravenous Every 24 hours 07/01/14 1858 07/05/14 0757   07/01/14 1400   fluconazole (DIFLUCAN) IVPB 200 mg  Status:  Discontinued     200 mg 100 mL/hr over 60 Minutes Intravenous Every 24 hours 07/01/14 1330 07/01/14 1853   07/01/14 1000  piperacillin-tazobactam (ZOSYN) IVPB 2.25 g  Status:  Discontinued     2.25 g 100 mL/hr over 30 Minutes Intravenous Every 8 hours 07/01/14 0952 07/01/14 1853   07/01/14 1000  vancomycin (VANCOCIN) 500 mg in sodium chloride 0.9 % 100 mL IVPB     500 mg 100 mL/hr over 60 Minutes Intravenous  Once 07/01/14 0952 07/01/14 1126   06/30/14 1200  vancomycin (VANCOCIN) IVPB 1000 mg/200 mL premix     1,000 mg 200 mL/hr over 60 Minutes Intravenous  Once 06/30/14 0944 06/30/14 1403   06/30/14 1200  ceFEPIme (MAXIPIME) 2 g in dextrose 5 % 50 mL IVPB     2 g 100 mL/hr over 30 Minutes Intravenous  Once 06/30/14 0944 06/30/14 1425   06/30/14 0100  vancomycin (VANCOCIN) 2,000 mg in sodium chloride 0.9 % 500 mL IVPB     2,000 mg 250 mL/hr over 120 Minutes Intravenous  Once 06/30/14 0048 06/30/14 0525   06/30/14 0100  ceFEPIme (MAXIPIME) 2 g in dextrose 5 % 50 mL IVPB     2 g 100 mL/hr over 30 Minutes Intravenous  Once 06/30/14 0048 06/30/14 0322   06/27/14 0600  cefUROXime (ZINACEF) 1.5 g in dextrose 5 % 50 mL IVPB     1.5 g 100 mL/hr over 30 Minutes Intravenous On call to O.R. 06/26/14 1019 06/27/14 0630   06/19/14 1800  oseltamivir (TAMIFLU) capsule 30 mg     30 mg Oral Every M-W-F (1800) 06/19/14 1003 06/22/14 1841   06/18/14 1600  oseltamivir (TAMIFLU) capsule 30 mg  Status:  Discontinued     30 mg Oral Daily 06/18/14 1538 06/19/14 1003       Assessment/Plan   POD #35 s/p Exploratory  Laparotomy with graham patch closure of perforated pyloric ulcer, SBR for ischemic bowel - 07/01/2014 - Derrell Lolling Intra-abdominal fluid collection, no drain Now with duodenal fistula -CT 08/03/14 showed 3.5x5.7x3.0cm pre-rectal fluid collection. no drain was replaced after previous drain accidentally pulled out -Fistula study done and shows JP drain  connection to the duodenal bulb. - patient is currently being treated with NPO and TNA as she and her family have wished for aggressive treatment at this point. Strict NPO.  I d/w her this morning that this may be a weeks to months long treatment of NPO.  She was very surprised by this, despite [place] talking to her about it before.  I discussed her options of aggressive treatment with NPO vs more comfort approach with letting her eat and drink knowing this will not heal. -NS WD dressing changes to abdominal wound.  Would not replace VAC right now with the way the base of the wound looks -I&Os and routine drain care VTE prophylaxis-SCDs ID--Zosyn stopped on day #23 (07/24/14) C diff + -- Oral vanc New ESRD  PCM/TNA    LOS: 49 days    Nishaan Stanke E 08/05/2014, 8:18 AM Pager: [phone]

## 2014-08-05 NOTE — Progress Notes (Signed)
PARENTERAL NUTRITION CONSULT NOTE - Follow-up  Pharmacy Consult:  TPN Indication:  Duodenal fistula  No Known Allergies   Patient Measurements: Height: 5\' 6"  (167.6 cm) Weight: 170 lb 13.7 oz (77.5 kg) IBW/kg (Calculated) : 59.3  Adjusted body weight: 67 kg  Vital Signs: Temp: 98.1 F (36.7 C) (05/04 0745) Temp Source: Oral (05/04 0745) BP: 93/59 mmHg (05/04 0900) Pulse Rate: 115 (05/04 0900) Intake/Output from previous day: 05/03 0701 - 05/04 0700 In: 2720 [I.V.:160; TPN:2530] Out: 1300 [Drains:1300]  Labs:  Recent Labs  08/03/14 1630 08/04/14 0958 08/05/14 0400  WBC 12.4* 14.0* 16.3*  HGB 10.7* 9.6* 9.3*  HCT 33.1* 30.6* 28.9*  PLT 121* 255 285     Recent Labs  08/03/14 0500 08/04/14 0754 08/05/14 0400  NA 133* 131* 130*  130*  K 3.4* 3.4* 3.4*  3.3*  CL 92* 91* 88*  88*  CO2 27 28 25  26   GLUCOSE 138* 137* 156*  156*  BUN 59* 53* 81*  80*  CREATININE 6.38* 5.73* 7.27*  7.28*  CALCIUM 8.4* 8.4* 8.4*  8.4*  MG 1.4* 1.4* 1.8  PHOS 1.7* 2.0* 4.4  4.4  PROT 7.3  --   --   ALBUMIN 2.0* 2.1* 2.0*  AST 29  --   --   ALT 17  --   --   ALKPHOS 148*  --   --   BILITOT 1.1  --   --   PREALBUMIN 9.8*  --   --   TRIG 177*  --   --    Estimated Creatinine Clearance: 8.9 mL/min (by C-G formula based on Cr of 7.28).    Recent Labs  08/04/14 1736 08/04/14 2322 08/05/14 0441  GLUCAP 131* 137* 157*   Insulin Requirements in the past 24 hours: 4 units Novolog SSI  Assessment: 23 YOF with complicated hospital course. S/p closure perforated pyloric ulcer, SBR for ischemic necrosis, diffuse peritonitis on 07/01/14. Pharmacy consulted to provide TPN 4/1-4/15 for nutrition support for prolonged ileus. Pt then transitioned to po diet + supplements. 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  GI: Duodenal fistula. NPO. On TPN. Drain o/p 1300 ml/24h. Albumin 2.0. Prealbumin remains low at 9.8 Noted palliative care  discussions ongoing but for now pt/family wish for aggressive care. NGT out and pt refuses replacement. TPN stopped 5/2 at 1440 during rapid response event per Dr Jonnie Finner request due to volume overload.  Now TPN running at 100 ml/hr and lipids at 10 ml/hr.   Endo: h/o DM. CBGs acceptable, minimal SSI, had low CBGs prior to TPN start.   Lytes: none in TPN since pt on iHD. Na 130;  K 3.3/3.4 after 4 runs of K yesterday; to get 4 K bath on HD today (goal >/=4 with afib) Phos up to 4.4 after 30 mM Na phos yesterday  Mg 1.8 after 2 gm of mag (goal >/= 2 with afib), Ca 8.4 CoCa 10  Renal: New ESRD - started 3/17. HD on M/W/F.  Did not tolerate full HD session Monday.  In HD currently. Aranesp 150 qMonday due 5/2, Hg 11.3, was 12.2 5/1; d/w Zeyfang, decrease dose to 100 qMon, now Hg 9.3  Pulm: asthma, COPD. 2L [place]. CT negative for PE. CCM signed off 5/3  Cards: HTN.  (afib). Cards signed off 4/26. Midodrine and amio on hold (no NGT and NPO) IV metoprolol. CHADS-VASc =4 (not good candidate for [facility]).  Hepatotobil: Alk phos slightly elevated,  other LFTs wnl. TG 177  Neuro: hx depression / insomnia - new stroke noted on 3/18.   ID: Pt continues on oral vanc D#13/14 for cdiff colitis. Diarrhea is better. WBC up to 16.3  Best Practices: SCDs, PPI IV  TPN Access: PICC line in IR 4/27  TPN start date: 4/1->4/15; restart 4/27>>  Current Nutrition:  NPO Clinimix 5/15 (no electrolytes) at 134ml/hr and 20% IVFE at 35ml/hr which will provide 1843 kcal and 96 gm protein.  Nutritional Goals: (per RD note 4/28) 2100-2300 kCal, 120-140 grams of protein per day Goal: Clinimix 5/15 at 176ml/hr + 20% IVFE 4ml/hr to provide 2184 kcal and 120 gm protein  Plan:  - continue Clinimix 5/15 (no electrolytes) at 100/hr and 20% IVFE at 33ml/hr. This will provide 100% support.  -Replete mag with 1 gm - Trace elements and MVI daily in TPN.  - Continue sensitive SSI q6h -TPN labs in am  Eudelia Bunch,  Pharm.D. [phone] 08/05/2014 9:27 AM

## 2014-08-05 NOTE — Progress Notes (Signed)
Progress Note [facility].   [name] [id number] DOB: 1956/01/31 DOA: 06/17/2014 PCP: Willey BladeEAN, [name], MD  Admit HPI / Brief Narrative36: 59 yo female smoker with hx HTN, ESRD on HD (just began this admit), COPD, failed L AV fistula, initially admitted 3/16 with flu and acute hypoxic resp failure. Ultimately tx to [facility] due to need for HD. 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   Patient on 3/31 was taking to the OR for ex lap for per pyloric ulcer and sm bowel resection. Patient develops septic shock on 4-1, she was started on pressors and wean off pressors on 4-3. Subsequently she was diagnosed with pelvic abscess by CT scan perform on 4-7. She underwent pelvic drain placement by IR. Patient also develops C. diff diagnosed 4-12.  C diff was not responding flagyl, and she was started on vancomycin 4-16. She also develops  run of SVT. Cardiology has been helping with management. Course complicated with high RV pressure and concerns for PE; . CT angio negative for PE. Also with fistula formation between anastomosis and blake drain, making her back to NPO state and in need of TPN. Family want everything to be done. Palliative care consulted and following. Currently Full code. Patient on 5-2 develops hypoxemia, hypotension and SVT during dialysis treatment. Dialysis was stop. She received IV bolus. She was started on BIPAP. She was transfer to Step down unit. CCM and cardiology re consulted.Her Hr decrease, BP improved. She was taken off BIPAP. She also develop new epigastric pain 5-02. Plan is to repeat CT abdomen. Surgery following.    HPI/Subjective: Seen on HD, feels tired, no other complaints  Assessment/Plan: Acute hypoxic Respiratory Failure:  -2nd to H1N1, hypervolemia, HCAP >> resolved.  -also has COPD and PAH -improving -Nebulizer PRN and CPAP QHS   Septic shock  -due to perforated prepyloric ulcer, requiring pressors -resolved  C. difficile colitis -C  diff positive on 4-12, Received 4 days of flagyl without improvement, then started on PO vanc -Now on Day 13/14 for oral vanc.  -Dr.Regelado discussed with surgery ok to continue oral vancomycin.  -Diarrhea  resolved.   Status post exploratory laparotomy with closure of perforated pyloric ulcer/abdominal abscess / small bowel resection w/ postop ileus and now with fistula -Pelvic abscess s/p percutaneous drain placement 4/8; now with leakage from anastomosis site -Received  Zosyn for 23 days. IV antibiotics discontinued by Surgery, also Received  18 days of vancomycin and fluconazole.  -Due to Fistula is back to NPO state , TPN.  -Fistula study done shows JP drain connection to the duodenal bulb 5-02. repeat CT abdomen with  3.0 x 3.5 x 5.7 cm collection in the pelvic cul-de-sac and extraluminal air alongside JP drain in anterior abd wall -Per CCS  Hypokalemia;  -replace in TNA  PSVT/concerns for pulmonary embolism given extremely high heart right side pressure -Has been evaluated by EP, no further episodes for 48h -Cardiology following peripherally- follow K+ and Mg  -Continue IV metoprolol, now off amiodarone;  -V-Q scan with intermediate probability for pulmonary embolism.  -CTA 4/27, negative for PE.   Small PFO Has been evaluated by Cardiology  ACA CVA with Lt sided weakness -TEE ;normal LV function; no LAA thrombus -Started on ASA 81mg  per NEuro, now NPO -Will need ongoing long term rehab - PT/OT to cont to follow while inpatient Patient remains very weak and deconditioned .   ESRD new start HD this admit  Ongoing hemodialysis  per Nephrology - perm-cath placed 3/26 d/t AVF problems Patient to have AV fistula reviewed at some point for permanent access (most likely in outpatient setting) .  Patient will need to be able to sit up for Outpatient dialysis. At this point still unable to sit up for HD -she was unable to complete dialysis treatment 5-2 due to hypotension, SVT.    Chronic diastolic congestive heart failure w/ Severe RHF EF 60-65%, grade 1 diastolic dysfunction. Appears compensated.  Fluids/volume managed with hemodialysis.  Elevated PA pressures on 2-D echo; CTA negative for PE>   Anemia of critical illness and chronic disease Hgb presently stable s/p 2U PRBC 4/13 - follow  Stable, monitor Aranesp/iron per renal discretion   Thrombocytopenia; resolved.  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 On TPN now, CBGs in 120-160s Hbaic 6.5  Abnormal TSH -Free T4 WNL -T3 1.8 -most likely sick thyroid with ongoing infection and body stress; also due to amiodarone use. TSH in 7 range -will continue low dose synthroid given PSVT, now on IV synthroid   L Renal mass  -noted on US, Indeterminate 1.9 cm hypoechoic mass off the interpolar region of the left kidney -may potentially represent a cyst however solid mass is not excluded -needs  Outpatient FU of this  Obesity - Inadequate oral intake related to altered GI function as evidenced by limited intake, ongoing.  Body mass index is 27.52 kg/(m^2). -patient weight trending down -patient unable to tolerate diet and with positive leaking fistula  Code Status: FULL Family Communication: discussed with patient.  Disposition Plan: VERY POOR PROGNOSIS, UNREALISTIC EXPECTATIONS, GIVEN AKI/ESRD, ONGOING SURGICAL ISSUES, NPO, PALLIATIVE FOLLOWING  Consultants: Cardiology Nephrology PCCM Gen Surgery  Palliative care  Significant Events: 3/16 influenza A+ 3/18 R ACA stroke 3/19 MRI multifocal acute infarction of both hemispheres, large right distal ACA 3/22 TEE severe RAE, severely reduced RV function, severe TR, no LAA thrombus, small PFO on TEE 3/31 to OR ex lap for per pyloric ulcer and  sm bowel resection 4/1 septic shock on pressors 4/3 pressors weaned off  4/4 restart pressors, CRRT 4/6 levo switched to neo 4/7 pelvic abscess on CT 4/8 IR placed pelvic drain 4/9 off pressors 4/11 long run of SVT. 4/23 VQ scan which demonstrated intermediate probability for PE 4/24 CT angiogram of her chest: Pending/unable to be done due to lack of access   Antibiotics: Zosyn >stopped on 4/22 (received a total of 23 days of treatment ) Vancomycin 4-16  DVT prophylaxis: SCDs  Objective: Blood pressure 108/62, pulse 116, temperature 98.1 F (36.7 C), temperature source Oral, resp. rate 20, height 5\' 6"  (1.676 m), weight 77.3 kg (170 lb 6.7 oz), SpO2 98 %.  Intake/Output Summary (Last 24 hours) at 08/05/14 1508 Last data filed at 08/05/14 1300  Gross per 24 hour  Intake   1650 ml  Output   2342 ml  Net   -692 ml   Exam: General: AAOx3, chronically ill appearing Lungs: poor air movement, scattered ronchi Cardiovascular: Regular rate, no murmurs and no gallops   Abdomen: Abdominal wound dressing, clean and dry; abdomen slightly distended, drain intact and with /green drainage; mild tenderness as expected;  Extremities: no edema   Data Reviewed: Basic Metabolic Panel:  Recent Labs Lab 07/30/14 0505  08/01/14 0538 08/02/14 0519 08/02/14 1102 08/03/14 0500  08/04/14 0754 08/05/14 0400  NA 138  < > 137 132* 133* 133* 131* 130*  130*  K 3.3*  < > 2.8* 3.2* 3.5 3.4* 3.4* 3.4*  3.3*  CL 99  < > 95* 91* 91* 92* 91* 88*  88*  CO2 25  < > GLUCOSE 138*  < > 183* 180* 161* 138* 137* 156*  156*  BUN 14  < > 23 47* 54* 59* 53* 81*  80*  CREATININE 5.20*  < > 4.79* 6.50* 7.29* 6.38* 5.73* 7.27*  7.28*  CALCIUM 8.6  < > 8.8 8.6* 8.9 8.4* 8.4* 8.4*  8.4*  MG 2.0  --  1.9  --   --  1.4* 1.4* 1.8  PHOS 3.6  --  1.2* 2.3* 1.7* 1.7* 2.0* 4.4  4.4  < > = values in this interval not displayed.  Liver Function Tests:  Recent Labs Lab 07/30/14 0505   08/02/14 0519 08/02/14 1102 08/03/14 0500 08/04/14 0754 08/05/14 0400  AST 36  --   --   --  29  --   --   ALT 20  --   --   --  17  --   --   ALKPHOS 146*  --   --   --  148*  --   --   BILITOT 0.8  --   --   --  1.1  --   --   PROT 7.7  --   --   --  7.3  --   --   ALBUMIN 2.1*  < > 2.0* 2.1* 2.0* 2.1* 2.0*  < > = values in this interval not displayed. CBC:  Recent Labs Lab 07/29/14 2047  08/02/14 1102 08/03/14 0500 08/03/14 1630 08/04/14 0958 08/05/14 0400  WBC 11.1*  < > 12.8* 11.2* 12.4* 14.0* 16.3*  NEUTROABS 8.0*  --   --  7.8*  --   --   --   HGB 9.7*  < > 12.3 11.3* 10.7* 9.6* 9.3*  HCT 31.4*  < > 38.9 35.0* 33.1* 30.6* 28.9*  MCV 92.6  < > 92.0 90.7 89.9 90.5 87.8  PLT 342  < > 258 243 121* 255 285  < > = values in this interval not displayed.  CBG:  Recent Labs Lab 08/04/14 0529 08/04/14 1219 08/04/14 1736 08/04/14 2322 08/05/14 0441  GLUCAP 149* 169* 131* 137* 157*    Scheduled Meds:  Scheduled Meds: . antiseptic oral rinse  7 mL Mouth Rinse q12n4p  . budesonide (PULMICORT) nebulizer solution  0.25 mg Nebulization BID  . chlorhexidine  15 mL Mouth Rinse BID  . darbepoetin (ARANESP) injection - DIALYSIS  100 mcg Intravenous Q Mon-HD  . insulin aspart  0-9 Units Subcutaneous 4 times per day  . levalbuterol  0.63 mg Nebulization Q6H  . levothyroxine  12.5 mcg Intravenous Daily  . metoprolol  2.5 mg Intravenous 3 times per day  . sodium chloride  10-40 mL Intracatheter Q12H  . sodium phosphate  Dextrose 5% IVPB  30 mmol Intravenous Once  . vancomycin  125 mg Oral 4 times per day    Time spent on care of this patient: 35 minutes   Zannie Cove , MD  [phone] [facility] Office  [phone]   If 7PM-7AM, please contact night-coverage [email or web address] Password [email or web address] 08/05/2014, 3:08 PM   LOS: 49 days

## 2014-08-05 NOTE — Progress Notes (Signed)
Patient [name]      DOB: 02/06/1956      ZHY:[id number]   [facility]    Subjective: Frustrated about being NPO. Doesn't remember talking about this with surgery today.  Abdominal pain better controlled.  Denies N/V. Denies SOB    Filed Vitals:   08/05/14 1200  BP: 108/62  Pulse: 116  Temp: 98.1 F (36.7 C)  Resp: 20   Physical exam: GEN: Alert, NAD CV: tachy LUNGS: CTAB ABD: Soft, +PEG EXT: warm  CBC    Component Value Date/Time   WBC 16.3* 08/05/2014 0400   WBC 6.8 10/02/2012 1046   RBC 3.29* 08/05/2014 0400   RBC 4.87 10/02/2012 1046   HGB 9.3* 08/05/2014 0400   HGB 13.1 10/02/2012 1046   HCT 28.9* 08/05/2014 0400   HCT 40.3 10/02/2012 1046   PLT 285 08/05/2014 0400   PLT 359 10/02/2012 1046   MCV 87.8 08/05/2014 0400   MCV 82.6 10/02/2012 1046   MCH 28.3 08/05/2014 0400   MCH 26.8 10/02/2012 1046   MCHC 32.2 08/05/2014 0400   MCHC 32.5 10/02/2012 1046   RDW 22.1* 08/05/2014 0400   RDW 17.4* 10/02/2012 1046   LYMPHSABS 1.5 08/03/2014 0500   LYMPHSABS 1.9 10/02/2012 1046   MONOABS 1.6* 08/03/2014 0500   MONOABS 0.8 10/02/2012 1046   EOSABS 0.2 08/03/2014 0500   EOSABS 0.2 10/02/2012 1046   BASOSABS 0.1 08/03/2014 0500   BASOSABS 0.1 10/02/2012 1046    CMP     Component Value Date/Time   NA 130* 08/05/2014 0400   NA 130* 08/05/2014 0400   NA 139 10/02/2012 1046   K 3.3* 08/05/2014 0400   K 3.4* 08/05/2014 0400   K 4.4 10/02/2012 1046   CL 88* 08/05/2014 0400   CL 88* 08/05/2014 0400   CO2 26 08/05/2014 0400   CO2 25 08/05/2014 0400   CO2 25 10/02/2012 1046   GLUCOSE 156* 08/05/2014 0400   GLUCOSE 156* 08/05/2014 0400   GLUCOSE 110 10/02/2012 1046   BUN 80* 08/05/2014 0400   BUN 81* 08/05/2014 0400   BUN 35.2* 10/02/2012 1046   CREATININE 7.28* 08/05/2014 0400   CREATININE 7.27* 08/05/2014 0400   CREATININE 2.5* 10/02/2012 1046   CALCIUM 8.4* 08/05/2014 0400   CALCIUM 8.4* 08/05/2014  0400   CALCIUM 9.8 10/02/2012 1046   PROT 7.3 08/03/2014 0500   PROT 8.4* 10/02/2012 1046   ALBUMIN 2.0* 08/05/2014 0400   ALBUMIN 3.1* 10/02/2012 1046   AST 29 08/03/2014 0500   AST 9 10/02/2012 1046   ALT 17 08/03/2014 0500   ALT <6 Repeated and Verified 10/02/2012 1046   ALKPHOS 148* 08/03/2014 0500   ALKPHOS 108 10/02/2012 1046   BILITOT 1.1 08/03/2014 0500   BILITOT 0.21 10/02/2012 1046   GFRNONAA 6* 08/05/2014 0400   GFRNONAA 6* 08/05/2014 0400   GFRAA 6* 08/05/2014 0400   GFRAA 6* 08/05/2014 0400     Assessment and plan: 59 yo female with multiple medical problems including ESRD on HD, COPD, C diff, SVT, perforated ulcer, ischemic ileum, enteric fistula requiring TPN, pelvic abscess. Palliative consulted for GOC.   1. Code Status- Full Code, see discussions from Dr Debby BudNorins  2. GOC- See prior documentation  Appreciate surgery discussion with her today.  Biggest frustration for [name] is being NPO.  Doesn't remember surgery talking about this today but I reinforced that this could be a weeks-months process.  Attempted to get a hold of  daughter today as it would be good to re-engage with her.  I am awaiting call back from her.  [name] has had discussions about option of comfort care, but she continues to express her desire to continue with current plan of care and uses her spirituality as a rallying point to continue.    3. Symptom Management 1. Abdominal Pain- better controlled with PRN fentanyl.   4. Psychosocial/Spiritual. Has 2 children. Lived at home with her daughter [name]. Son also involved in her care. Enjoys watching baseball. Spirituality very important to her.    Total Time: 15 minutes >50% of time spent in counseling and coordination of care regarding above.   Orvis BrillAaron J. Herminio Kniskern D.O. Palliative Medicine Team at [facility]  Pager: [phone] Team Phone: [phone]

## 2014-08-05 NOTE — Progress Notes (Signed)
[facility] Progress Note   Subjective: no more problems overnight  Filed Vitals:   08/05/14 0752 08/05/14 0830 08/05/14 0900 08/05/14 0930  BP: 96/71 88/59 93/59  86/52  Pulse: 108 114 115 118  Temp:      TempSrc:      Resp:  28 27 26   Height:      Weight:      SpO2:       Exam: Up in chair, alert No jvd Chest clear bilat Abd soft VAC midline ND Ext no LE edema Access is R IJ cath and left arm AVF +bruit Neuro is ox 3  HD: new start, on MWF schedule in hospital        Assessment: 1. ESRD new start MWF inpt, using cath as AVF poor fxn 2. CVA w left hemiparesis 3. S/P exlap for perf pyloric ulcer/ SBR for ischemia SB - now w duodenal fistula, bulb drain 4. Abd wound - open, packing per surg 5. Nutrition - TNA for now 6. Volume - large drain outputs are balancing vol load with TNA, no vol excess 7. Pelvic fluid collection - s/p IR drain 8. 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 DM2 13. R HF -severe by ECHO 14. Hypotension on midodrine 15. Hypokalemia - difficult to chase w IV, she is having large GI losses accounting for this. 4K bath w HD. 16. Hypomagnesemia - didn't get the 2gm ordered yest, will reorder 17. Nutrition -NG out, refusing replacement; not able to take po meds now  Plan - HD today, 4K bath, no UF as she may be vol depleted from large GI losses    Vinson Moselleob Saadiya Wilfong MD  pager [phone]    cell [phone]  08/05/2014, 10:04 AM     Recent Labs Lab 08/03/14 0500 08/04/14 0754 08/05/14 0400  NA 133* 131* 130*  130*  K 3.4* 3.4* 3.4*  3.3*  CL 92* 91* 88*  88*  CO2 27 28 25  26   GLUCOSE 138* 137* 156*  156*  BUN 59* 53* 81*  80*  CREATININE 6.38* 5.73* 7.27*  7.28*  CALCIUM 8.4* 8.4* 8.4*  8.4*  PHOS 1.7* 2.0* 4.4  4.4    Recent Labs Lab 07/30/14 0505  08/03/14 0500 08/04/14 0754 08/05/14 0400  AST 36  --  29  --   --   ALT 20   --  17  --   --   ALKPHOS 146*  --  148*  --   --   BILITOT 0.8  --  1.1  --   --   PROT 7.7  --  7.3  --   --   ALBUMIN 2.1*  < > 2.0* 2.1* 2.0*  < > = values in this interval not displayed.  Recent Labs Lab 07/29/14 2047  08/03/14 0500 08/03/14 1630 08/04/14 0958 08/05/14 0400  WBC 11.1*  < > 11.2* 12.4* 14.0* 16.3*  NEUTROABS 8.0*  --  7.8*  --   --   --   HGB 9.7*  < > 11.3* 10.7* 9.6* 9.3*  HCT 31.4*  < > 35.0* 33.1* 30.6* 28.9*  MCV 92.6  < > 90.7 89.9 90.5 87.8  PLT 342  < > 243 121* 255 285  < > = values in this interval not displayed. Marland Kitchen. antiseptic oral rinse  7 mL Mouth Rinse q12n4p  .  budesonide (PULMICORT) nebulizer solution  0.25 mg Nebulization BID  . chlorhexidine  15 mL Mouth Rinse BID  . darbepoetin (ARANESP) injection - DIALYSIS  100 mcg Intravenous Q Mon-HD  . insulin aspart  0-9 Units Subcutaneous 4 times per day  . levalbuterol  0.63 mg Nebulization Q6H  . levothyroxine  12.5 mcg Intravenous Daily  . LORazepam      . magnesium sulfate 1 - 4 g bolus IVPB  1 g Intravenous Once  . metoprolol  2.5 mg Intravenous 3 times per day  . sodium chloride  10-40 mL Intracatheter Q12H  . sodium phosphate  Dextrose 5% IVPB  30 mmol Intravenous Once  . vancomycin  125 mg Oral 4 times per day   . sodium chloride 10 mL/hr at 08/04/14 2300  . TPN (CLINIMIX) Adult without lytes 100 mL/hr at 08/05/14 0600   And  . fat emulsion 10 kcal (08/05/14 0600)  . TPN (CLINIMIX) Adult without lytes     And  . fat emulsion     sodium chloride, sodium chloride, bisacodyl, diphenhydrAMINE, feeding supplement (NEPRO CARB STEADY), fentaNYL (SUBLIMAZE) injection, lidocaine (PF), lidocaine-prilocaine, ondansetron (ZOFRAN) IV, pentafluoroprop-tetrafluoroeth, sodium chloride

## 2014-08-05 NOTE — Progress Notes (Signed)
SUBJECTIVE:  She is less uncomfortable with abdominal discomfort.  Breathing quickly but she reports that she feels OK   PHYSICAL EXAM Filed Vitals:   08/05/14 0752 08/05/14 0830 08/05/14 0900 08/05/14 0930  BP: 96/71 88/59 93/59  86/52  Pulse: 108 114 115 118  Temp:      TempSrc:      Resp:  28 27 26   Height:      Weight:      SpO2:       General:  No distress Heart:  Tachy Extremities:  No edema  LABS:  Results for orders placed or performed during the hospital encounter of 06/17/14 (from the past 24 hour(s))  Glucose, capillary     Status: Abnormal   Collection Time: 08/04/14 12:19 PM  Result Value Ref Range   Glucose-Capillary 169 (H) 70 - 99 mg/dL  Glucose, capillary     Status: Abnormal   Collection Time: 08/04/14  5:36 PM  Result Value Ref Range   Glucose-Capillary 131 (H) 70 - 99 mg/dL   Comment 1 Notify RN    Comment 2 Document in Chart   Glucose, capillary     Status: Abnormal   Collection Time: 08/04/14 11:22 PM  Result Value Ref Range   Glucose-Capillary 137 (H) 70 - 99 mg/dL  CBC     Status: Abnormal   Collection Time: 08/05/14  4:00 AM  Result Value Ref Range   WBC 16.3 (H) 4.0 - 10.5 K/uL   RBC 3.29 (L) 3.87 - 5.11 MIL/uL   Hemoglobin 9.3 (L) 12.0 - 15.0 g/dL   HCT 16.128.9 (L) 09.636.0 - 04.546.0 %   MCV 87.8 78.0 - 100.0 fL   MCH 28.3 26.0 - 34.0 pg   MCHC 32.2 30.0 - 36.0 g/dL   RDW 40.922.1 (H) 81.111.5 - 91.415.5 %   Platelets 285 150 - 400 K/uL  Renal function panel     Status: Abnormal   Collection Time: 08/05/14  4:00 AM  Result Value Ref Range   Sodium 130 (L) 135 - 145 mmol/L   Potassium 3.3 (L) 3.5 - 5.1 mmol/L   Chloride 88 (L) 101 - 111 mmol/L   CO2 26 22 - 32 mmol/L   Glucose, Bld 156 (H) 70 - 99 mg/dL   BUN 80 (H) 6 - 20 mg/dL   Creatinine, Ser 7.827.28 (H) 0.44 - 1.00 mg/dL   Calcium 8.4 (L) 8.9 - 10.3 mg/dL   Phosphorus 4.4 2.5 - 4.6 mg/dL   Albumin 2.0 (L) 3.5 - 5.0 g/dL   GFR calc non Af Amer 6 (L) >60 mL/min   GFR calc Af Amer 6 (L) >60 mL/min    Anion gap 16 (H) 5 - 15  Magnesium     Status: None   Collection Time: 08/05/14  4:00 AM  Result Value Ref Range   Magnesium 1.8 1.7 - 2.4 mg/dL  Basic metabolic panel     Status: Abnormal   Collection Time: 08/05/14  4:00 AM  Result Value Ref Range   Sodium 130 (L) 135 - 145 mmol/L   Potassium 3.4 (L) 3.5 - 5.1 mmol/L   Chloride 88 (L) 101 - 111 mmol/L   CO2 25 22 - 32 mmol/L   Glucose, Bld 156 (H) 70 - 99 mg/dL   BUN 81 (H) 6 - 20 mg/dL   Creatinine, Ser 9.567.27 (H) 0.44 - 1.00 mg/dL   Calcium 8.4 (L) 8.9 - 10.3 mg/dL   GFR calc non Af Amer 6 (L) >  60 mL/min   GFR calc Af Amer 6 (L) >60 mL/min   Anion gap 17 (H) 5 - 15  Phosphorus     Status: None   Collection Time: 08/05/14  4:00 AM  Result Value Ref Range   Phosphorus 4.4 2.5 - 4.6 mg/dL  Glucose, capillary     Status: Abnormal   Collection Time: 08/05/14  4:41 AM  Result Value Ref Range   Glucose-Capillary 157 (H) 70 - 99 mg/dL    Intake/Output Summary (Last 24 hours) at 08/05/14 1024 Last data filed at 08/05/14 0600  Gross per 24 hour  Intake   2280 ml  Output   1300 ml  Net    980 ml      ASSESSMENT AND PLAN:  SVT:  Event two days ago.  Currently on HD without further events.  She is NPO.  Options limited.  Start IV amio if she has recurrent arrhythmias.  We will see as needed.    Fayrene FearingJames Bob [facility] Memorial [facility] 08/05/2014 10:24 AM

## 2014-08-06 LAB — GLUCOSE, CAPILLARY
GLUCOSE-CAPILLARY: 154 mg/dL — AB (ref 70–99)
GLUCOSE-CAPILLARY: 116 mg/dL — AB (ref 70–99)
GLUCOSE-CAPILLARY: 143 mg/dL — AB (ref 70–99)
Glucose-Capillary: 162 mg/dL — ABNORMAL HIGH (ref 70–99)
Glucose-Capillary: 164 mg/dL — ABNORMAL HIGH (ref 70–99)

## 2014-08-06 LAB — COMPREHENSIVE METABOLIC PANEL
ALBUMIN: 1.9 g/dL — AB (ref 3.5–5.0)
ALT: 18 U/L (ref 14–54)
AST: 31 U/L (ref 15–41)
Alkaline Phosphatase: 160 U/L — ABNORMAL HIGH (ref 38–126)
Anion gap: 12 (ref 5–15)
BUN: 41 mg/dL — ABNORMAL HIGH (ref 6–20)
CO2: 30 mmol/L (ref 22–32)
CREATININE: 4.18 mg/dL — AB (ref 0.44–1.00)
Calcium: 8.6 mg/dL — ABNORMAL LOW (ref 8.9–10.3)
Chloride: 93 mmol/L — ABNORMAL LOW (ref 101–111)
GFR calc Af Amer: 13 mL/min — ABNORMAL LOW (ref >60)
GFR calc non Af Amer: 11 mL/min — ABNORMAL LOW (ref >60)
Glucose, Bld: 134 mg/dL — ABNORMAL HIGH (ref 70–99)
Potassium: 3.3 mmol/L — ABNORMAL LOW (ref 3.5–5.1)
SODIUM: 135 mmol/L (ref 135–145)
TOTAL PROTEIN: 7.3 g/dL (ref 6.5–8.1)
Total Bilirubin: 1 mg/dL (ref 0.3–1.2)

## 2014-08-06 LAB — MRSA PCR SCREENING: MRSA by PCR: NEGATIVE

## 2014-08-06 LAB — POTASSIUM: Potassium: 3.5 mmol/L (ref 3.5–5.1)

## 2014-08-06 LAB — MAGNESIUM: Magnesium: 2 mg/dL (ref 1.7–2.4)

## 2014-08-06 LAB — PHOSPHORUS: Phosphorus: 1.8 mg/dL — ABNORMAL LOW (ref 2.5–4.6)

## 2014-08-06 MED ORDER — POTASSIUM PHOSPHATES 15 MMOLE/5ML IV SOLN
15.0000 mmol | Freq: Once | INTRAVENOUS | Status: AC
  Administered 2014-08-06: 15 mmol via INTRAVENOUS
  Filled 2014-08-06: qty 5

## 2014-08-06 MED ORDER — LIDOCAINE HCL (PF) 1 % IJ SOLN: 5.0000 mL | INTRAMUSCULAR | Status: DC | PRN

## 2014-08-06 MED ORDER — HEPARIN SODIUM (PORCINE) 1000 UNIT/ML DIALYSIS: 1000.0000 [IU] | INTRAMUSCULAR | Status: DC | PRN

## 2014-08-06 MED ORDER — ALTEPLASE 2 MG IJ SOLR
2.0000 mg | Freq: Once | INTRAMUSCULAR | Status: AC | PRN
  Filled 2014-08-06: qty 2

## 2014-08-06 MED ORDER — SODIUM CHLORIDE 0.9 % IV SOLN: 100.0000 mL | INTRAVENOUS | Status: DC | PRN

## 2014-08-06 MED ORDER — PENTAFLUOROPROP-TETRAFLUOROETH EX AERO: 1.0000 "application " | INHALATION_SPRAY | CUTANEOUS | Status: DC | PRN

## 2014-08-06 MED ORDER — POTASSIUM CHLORIDE 10 MEQ/50ML IV SOLN
10.0000 meq | INTRAVENOUS | Status: AC
Start: 2014-08-06 — End: 2014-08-06
  Administered 2014-08-06: 10 meq via INTRAVENOUS

## 2014-08-06 MED ORDER — TRACE MINERALS CR-CU-F-FE-I-MN-MO-SE-ZN IV SOLN
INTRAVENOUS | Status: AC
  Administered 2014-08-06: 18:00:00 via INTRAVENOUS
  Filled 2014-08-06: qty 2400

## 2014-08-06 MED ORDER — LIDOCAINE-PRILOCAINE 2.5-2.5 % EX CREA: 1.0000 "application " | TOPICAL_CREAM | CUTANEOUS | Status: DC | PRN

## 2014-08-06 MED ORDER — LEVALBUTEROL HCL 0.63 MG/3ML IN NEBU
0.6300 mg | INHALATION_SOLUTION | Freq: Four times a day (QID) | RESPIRATORY_TRACT | Status: DC | PRN
  Administered 2014-08-08 – 2014-08-10 (×3): 0.63 mg via RESPIRATORY_TRACT
  Filled 2014-08-06 (×3): qty 3

## 2014-08-06 MED ORDER — FAT EMULSION 20 % IV EMUL
240.0000 mL | INTRAVENOUS | Status: AC
  Administered 2014-08-06: 240 mL via INTRAVENOUS
  Filled 2014-08-06: qty 250

## 2014-08-06 MED ORDER — POTASSIUM CHLORIDE 10 MEQ/50ML IV SOLN
INTRAVENOUS | Status: AC
  Administered 2014-08-06: 10 meq via INTRAVENOUS
  Filled 2014-08-06: qty 50

## 2014-08-06 MED ORDER — POTASSIUM CHLORIDE 10 MEQ/100ML IV SOLN
10.0000 meq | Freq: Once | INTRAVENOUS | Status: DC
  Filled 2014-08-06: qty 100

## 2014-08-06 MED ORDER — NEPRO/CARBSTEADY PO LIQD
237.0000 mL | ORAL | Status: DC | PRN
  Filled 2014-08-06: qty 237

## 2014-08-06 NOTE — Progress Notes (Addendum)
Progress Note [facility].   [name] ZOX:[id number] DOB: July 04, 1955 DOA: 06/17/2014 PCP: Willey BladeEAN, [name], MD  Admit HPI / Brief Narrative11: 58 yo female smoker with hx HTN, ESRD on HD (just began this admit), COPD, failed L AV fistula, initially admitted 3/16 with flu and acute hypoxic resp failure. Ultimately tx to [facility] due to need for HD. 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   Patient on 3/31 was taking to the OR for ex lap for per pyloric ulcer and sm bowel resection. Patient develops septic shock on 4-1, she was started on pressors and wean off pressors on 4-3. Subsequently she was diagnosed with pelvic abscess by CT scan perform on 4-7. She underwent pelvic drain placement by IR. Patient also develops C. diff diagnosed 4-12.  C diff was not responding flagyl, and she was started on vancomycin 4-16. She also develops  run of SVT. Cardiology has been helping with management. Course complicated with high RV pressure and concerns for PE; . CT angio negative for PE. Also with fistula formation between anastomosis and blake drain, making her back to NPO state and in need of TPN. Family want everything to be done. Palliative care consulted and following. Currently Full code. Patient on 5-2 develops hypoxemia, hypotension and SVT during dialysis treatment. Dialysis was stop. She received IV bolus. She was started on BIPAP. She was transfer to Step down unit. CCM and cardiology re consulted.Her Hr decrease, BP improved. She was taken off BIPAP. She also develop new epigastric pain 5-02. Plan is to repeat CT abdomen. Surgery following.    HPI/Subjective: Keeps asking about drinking something, feels tired, no other complaints  Assessment/Plan: Acute hypoxic Respiratory Failure:  -2nd to H1N1, hypervolemia, HCAP >> resolved.  -also has COPD and PAH -improving, now weaned off O2 -Nebulizer PRN and CPAP QHS   Septic shock  -due to perforated prepyloric ulcer, requiring  pressors -resolved  C. difficile colitis -C diff positive on 4-12, Received 4 days of flagyl without improvement, then started on PO vanc -Completed 3weeks of Oral Vanc, 1 week after IV Zosyn was stopped, stop today 5/5 -Diarrhea  resolved.   Status post exploratory laparotomy with closure of perforated pyloric ulcer/abdominal abscess / small bowel resection w/ postop ileus and now with fistula -Pelvic abscess s/p percutaneous drain placement 4/8; now with leakage from anastomosis site -Received  Zosyn for 23 days. IV antibiotics discontinued by Surgery on 4/22, also Received 18 days of Vancomycin and fluconazole.  -Due to Fistula is back to NPO state, TPN.  -Fistula study done shows JP drain connection to the duodenal bulb 5-02. repeat CT abdomen with  3.0 x 3.5 x 5.7 cm collection in the pelvic cul-de-sac and extraluminal air alongside JP drain in anterior abd wall -Per CCS  Hypokalemia;  -replace in TNA  PSVT/concerns for pulmonary embolism given extremely high heart right side pressure -Has been evaluated by EP, no further episodes for 48h -Cardiology following peripherally- follow K+ and Mg  -Continue IV metoprolol, now off amiodarone;  -V-Q scan with intermediate probability for pulmonary embolism.  -CTA 4/27, negative for PE.   Small PFO Has been evaluated by Cardiology  ACA CVA with Lt sided weakness -TEE ;normal LV function; no LAA thrombus -Started on ASA 81mg  per NEuro, now NPO -Will need ongoing long term rehab - PT/OT to cont to follow while inpatient Patient remains very weak and deconditioned .   ESRD new start HD this admit  Ongoing hemodialysis per Nephrology - perm-cath placed 3/26 d/t AVF problems Patient to have AV fistula reviewed at some point for permanent access (most likely in outpatient setting) .  Patient will need to be able to sit up for Outpatient dialysis. At this point still unable to sit up for HD -she was unable to complete dialysis treatment  5-2 due to hypotension, SVT.   Chronic diastolic congestive heart failure w/ Severe RHF EF 60-65%, grade 1 diastolic dysfunction. Appears compensated.  Fluids/volume managed with hemodialysis.  Elevated PA pressures on 2-D echo; CTA negative for PE>   Anemia of critical illness and chronic disease Hgb presently stable s/p 2U PRBC 4/13 - follow  Stable, monitor Aranesp/iron per renal discretion   Thrombocytopenia; resolved.  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 On TPN now, CBGs in 120-160s Hbaic 6.5  Abnormal TSH -Free T4 WNL -T3 1.8 -most likely sick thyroid with ongoing infection and body stress; also due to amiodarone use. TSH in 7 range -will continue low dose synthroid given PSVT, now on IV synthroid   L Renal mass  -noted on [place], Indeterminate 1.9 cm hypoechoic mass off the interpolar region of the left kidney -may potentially represent a cyst however solid mass is not excluded -needs  Outpatient FU of this  Obesity - Inadequate oral intake related to altered GI function as evidenced by limited intake, ongoing.  Body mass index is 29.16 kg/(m^2). -patient weight trending down -patient unable to tolerate diet and with positive leaking fistula  Code Status: FULL Family Communication: discussed with patient.  Disposition Plan: VERY POOR PROGNOSIS, UNREALISTIC EXPECTATIONS, GIVEN AKI/ESRD, ONGOING SURGICAL ISSUES, NPO, PALLIATIVE FOLLOWING, wants to continue aggressive course   Consultants: Cardiology Nephrology PCCM Gen Surgery  Palliative care  Significant Events: 3/16 influenza A+ 3/18 R ACA stroke 3/19 MRI multifocal acute infarction of both hemispheres, large right distal ACA 3/22 TEE severe RAE, severely reduced RV function,  severe TR, no LAA thrombus, small PFO on TEE 3/31 to OR ex lap for per pyloric ulcer and sm bowel resection 4/1 septic shock on pressors 4/3 pressors weaned off  4/4 restart pressors, CRRT 4/6 levo switched to neo 4/7 pelvic abscess on CT 4/8 IR placed pelvic drain 4/9 off pressors 4/11 long run of SVT. 4/23 VQ scan which demonstrated intermediate probability for PE 4/24 CT angiogram of her chest: Pending/unable to be done due to lack of access   Antibiotics: Zosyn >stopped on 4/22 (received a total of 23 days of treatment ) Vancomycin 4-16  DVT prophylaxis: SCDs  Objective: Blood pressure 100/59, pulse 110, temperature 98 F (36.7 C), temperature source Oral, resp. rate 26, height  (1.676 m), weight 81.9 kg (180 lb 8.9 oz), SpO2 96 %.  Intake/Output Summary (Last 24 hours) at 08/06/14 1454 Last data filed at 08/06/14 0400  Gross per 24 hour  Intake 2433.32 ml  Output   1550 ml  Net 883.32 ml   Exam: General: AAOx3, chronically ill appearing Lungs: poor air movement, scattered ronchi Cardiovascular: Regular rate, no murmurs and no gallops   Abdomen: Abdominal wound dressing, clean and dry; abdomen slightly distended, drain intact and with /green drainage; mild tenderness as expected;  Extremities: no edema   Data Reviewed: Basic Metabolic Panel:  Recent Labs Lab 08/01/14 [phone]  08/02/14 1102 08/03/14 0500 08/04/14 0754 08/05/14 0400 08/06/14 0410  NA 137  < > 133* 133* 131* 130*  130* 135  K 2.8*  < > 3.5 3.4* 3.4* 3.4*  3.3* 3.3*  CL 95*  < > 91* 92* 91* 88*  88* 93*  CO2 30  < > 26 27 28 25  26 30   GLUCOSE 183*  < > 161* 138* 137* 156*  156* 134*  BUN 23  < > 54* 59* 53* 81*  80* 41*  CREATININE 4.79*  < > 7.29* 6.38* 5.73* 7.27*  7.28* 4.18*  CALCIUM 8.8  < > 8.9 8.4* 8.4* 8.4*  8.4* 8.6*  MG 1.9  --   --  1.4* 1.4* 1.8 2.0  PHOS 1.2*  < > 1.7* 1.7* 2.0* 4.4  4.4 1.8*  < > = values in this interval not displayed.  Liver Function  Tests:  Recent Labs Lab 08/02/14 1102 08/03/14 0500 08/04/14 0754 08/05/14 0400 08/06/14 0410  AST  --  29  --   --  31  ALT  --  17  --   --  18  ALKPHOS  --  148*  --   --  160*  BILITOT  --  1.1  --   --  1.0  PROT  --  7.3  --   --  7.3  ALBUMIN 2.1* 2.0* 2.1* 2.0* 1.9*   CBC:  Recent Labs Lab 08/02/14 1102 08/03/14 0500 08/03/14 1630 08/04/14 0958 08/05/14 0400  WBC 12.8* 11.2* 12.4* 14.0* 16.3*  NEUTROABS  --  7.8*  --   --   --   HGB 12.3 11.3* 10.7* 9.6* 9.3*  HCT 38.9 35.0* 33.1* 30.6* 28.9*  MCV 92.0 90.7 89.9 90.5 87.8  PLT 258 243 121* 255 285    CBG:  Recent Labs Lab 08/05/14 1507 08/05/14 1757 08/06/14 0009 08/06/14 0533 08/06/14 1210  GLUCAP 134* 131* 162* 154* 164*    Scheduled Meds:  Scheduled Meds: . antiseptic oral rinse  7 mL Mouth Rinse q12n4p  . budesonide (PULMICORT) nebulizer solution  0.25 mg Nebulization BID  . chlorhexidine  15 mL Mouth Rinse BID  . darbepoetin (ARANESP) injection - DIALYSIS  100 mcg Intravenous Q Mon-HD  . insulin aspart  0-9 Units Subcutaneous 4 times per day  . levothyroxine  12.5 mcg Intravenous Daily  . metoprolol  2.5 mg Intravenous 3 times per day  . potassium phosphate IVPB (mmol)  15 mmol Intravenous Once  . sodium chloride  10-40 mL Intracatheter Q12H  . vancomycin  125 mg Oral 4 times per day    Time spent on care of this patient: 35 minutes   Zannie CoveJOSEPH,Nadene Witherspoon , MD  [phone] [facility] Office  [phone]   If 7PM-7AM, please contact night-coverage [email or web address] Password [email or web address] 08/06/2014, 2:54 PM   LOS: 50 days

## 2014-08-06 NOTE — Progress Notes (Addendum)
PARENTERAL NUTRITION CONSULT NOTE - FOLLOW UP  Pharmacy Consult for TPN Indication: Large output Duodenal Fistula  No Known Allergies  Patient Measurements: Height: 5\' 6"  (167.6 cm) Weight: 180 lb 8.9 oz (81.9 kg) IBW/kg (Calculated) : 59.3 Adjusted Body Weight:  Usual Weight:   Vital Signs: Temp: 98 F (36.7 C) (05/05 0700) Temp Source: Oral (05/05 0700) BP: 124/64 mmHg (05/05 0641) Pulse Rate: 117 (05/05 0641) Intake/Output from previous day: 05/04 0701 - 05/05 0700 In: 2433.3 [EHO:1224.8] Out: 2592 [Drains:1950] Intake/Output from this shift:    Labs:  Recent Labs  08/03/14 1630 08/04/14 0958 08/05/14 0400  WBC 12.4* 14.0* 16.3*  HGB 10.7* 9.6* 9.3*  HCT 33.1* 30.6* 28.9*  PLT 121* 255 285     Recent Labs  08/04/14 0754 08/05/14 0400 08/06/14 0410  NA 131* 130*  130* 135  K 3.4* 3.4*  3.3* 3.3*  CL 91* 88*  88* 93*  CO2 28 25  26 30   GLUCOSE 137* 156*  156* 134*  BUN 53* 81*  80* 41*  CREATININE 5.73* 7.27*  7.28* 4.18*  CALCIUM 8.4* 8.4*  8.4* 8.6*  MG 1.4* 1.8 2.0  PHOS 2.0* 4.4  4.4 1.8*  PROT  --   --  7.3  ALBUMIN 2.1* 2.0* 1.9*  AST  --   --  31  ALT  --   --  18  ALKPHOS  --   --  160*  BILITOT  --   --  1.0   Estimated Creatinine Clearance: 15.8 mL/min (by C-G formula based on Cr of 4.18).    Recent Labs  08/05/14 1757 08/06/14 0009 08/06/14 0533  GLUCAP 131* 162* 154*    Medications:  Scheduled:  . antiseptic oral rinse  7 mL Mouth Rinse q12n4p  . budesonide (PULMICORT) nebulizer solution  0.25 mg Nebulization BID  . chlorhexidine  15 mL Mouth Rinse BID  . darbepoetin (ARANESP) injection - DIALYSIS  100 mcg Intravenous Q Mon-HD  . insulin aspart  0-9 Units Subcutaneous 4 times per day  . levalbuterol  0.63 mg Nebulization Q6H  . levothyroxine  12.5 mcg Intravenous Daily  . metoprolol  2.5 mg Intravenous 3 times per day  . sodium chloride  10-40 mL Intracatheter Q12H  . sodium phosphate  Dextrose 5% IVPB  30 mmol  Intravenous Once  . vancomycin  125 mg Oral 4 times per day    Insulin Requirements in the past 24 hours:   5 units Novolog SSI  Assessment: 62 YOF with complicated hospital course. S/p closure perforated pyloric ulcer, SBR for ischemic necrosis, diffuse peritonitis on 07/01/14. Pharmacy consulted to provide TPN 4/1-4/15 for nutrition support for prolonged ileus. Pt then transitioned to po diet + supplements. 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  GI:  Duodenal fistula. NPO.  Open abd wound.  Fistula output 1950 ml/24h. Albumin 2.0. Prealbumin remains low at 9.8 Noted palliative care discussions ongoing but for now pt/family wish for aggressive care. NGT out and pt refuses replacement. *TPN stopped 5/2 at 1440 during rapid response event per Dr Jonnie Finner request due to volume overload. Now TPN running at 100 ml/hr and lipids at 10 ml/hr with 5/5 note stating euvolemic to slightly dry  Endo:  h/o DM. CBGs acceptable, minimal SSI, had low CBGs prior to TPN start.   Lytes: none in TPN since pt on iHD. Na 130; K 3.3 (AFib, goal >= 4)- received no K runs 5/4, Phos  1.8, Mg 2 (AFib, goal >= 2), Ca-corr 10.3. Dr Jonnie Finner called me 5/5 & asked pharmacy to manage K replacement;  Current orders for 4K HD bath, next HD on 5/6  Renal: New ESRD - started 3/17. HD on M/W/F. Did not tolerate full HD session Monday. In HD currently.  Aranesp 100 qMon (decr from 150 2nd Hg).  Hg 9.3 on 5/4.  Pulm: asthma, COPD. RA. CT negative for PE. CCM signed off 5/3  Cards: HTN. (afib). Cards signed off 4/26. Midodrine and amio on hold (no NGT and NPO) IV metoprolol. CHADS-VASc =4 (not good candidate for [facility]).  Hepatotobil: Alk phos slightly elevated with upward trend, other LFTs wnl. TG 177 (5/2)  Neuro: hx depression / insomnia - new stroke noted on 3/18.   ID: Pt continues on PO Vanc D#13/14 for cdiff colitis, no stop date on MAR . Diarrhea is better. WBC up to 16.3 on  5/4  Best Practices: SCDs, PPI IV  TPN Access: PICC line in IR 4/27  TPN start date: 4/1->4/15; restart 4/27>>  Current Nutrition:  NPO Clinimix 5/15 (no electrolytes) at 164ml/hr and 20% IVFE at 73ml/hr which will provide 1843 kcal and 96 gm protein.  Nutritional Goals: (per RD note 4/28) 2100-2300 kCal, 120-140 grams of protein per day Goal: Clinimix 5/15 at 176ml/hr + 20% IVFE 59ml/hr to provide 2184 kcal and 120 gm protein  Plan:  - give Clinimix 5/15 no electrolytes at 100/hr and 20% IVFE at 69ml/hr. This will provide 100% support.  -  K run x 1 & KPhos 41mmol x 1 over 6hr (total K 25mEq) -  Check K & Phos this PM, redose if needed - Trace elements and MVI daily in TPN.  - Continue sensitive SSI q6h -  BMet, Mg, Phos in AM   Gracy Bruins, PharmD Clinical Pharmacist [facility]

## 2014-08-06 NOTE — Progress Notes (Signed)
Physical Therapy Treatment Patient Details Name: Jaydy C Varnell MRN: [id number] DOB: [birth year] Today's Date: 08/06/2014    History of Present Illness [name] Hoffmeier is a 59 y.o. female with a history of CKD and hypertension [facility]16 complaining of shortness of breath and generalized weakness x 2 weeks. Admitted with acute hypoxemic respiratory failure. During hospital stay noted to have weakness of her left side. 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  She developed septic shock 4/1 with pressors weaned off 4/3.  Began CRRT and pressors restarted 4/4; Developed pelvic abcess 4/7 with pelvic drain placed 4/8.  weaned off pressors 4/9.  Runs of SVT 4/5 and 4/11    PT Comments    Slow progress.  Needs encouragement to help her engage in therapy.  Fearful of falling in standing  Follow Up Recommendations  SNF;Supervision/Assistance - 24 hour     Equipment Recommendations   (TBD next venue)    Recommendations for Other Services       Precautions / Restrictions Precautions Precautions: Fall    Mobility  Bed Mobility Overal bed mobility: Needs Assistance Bed Mobility: Rolling;Sidelying to Sit Rolling: Min assist   Supine to sit: +2 for physical assistance;Mod assist     General bed mobility comments: up via left side where pt has limited use of the L UE.  Use of symetrical and asymetrical scoot with assist  Transfers Overall transfer level: Needs assistance Equipment used: 2 person hand held assist (chair back) Transfers: Sit to/from [employer]Stand;Squat Pivot Transfers Sit to Stand: Mod assist;+2 physical assistance (x3)   Squat pivot transfers: Mod assist;+2 safety/equipment     General transfer comment: cues for hand placement, assist to come forward and brace L LE  Ambulation/Gait                 Stairs             Wheelchair Mobility    Modified Rankin (Stroke Patients Only) Modified Rankin (Stroke Patients Only) Pre-Morbid Rankin Score: Slight disability Modified Rankin: Severe disability     Balance Overall balance assessment: Needs assistance Sitting-balance support: No upper extremity supported;Feet supported;Single extremity supported Sitting balance-Leahy Scale: Poor Sitting balance - Comments: pt still unable to maintain midline with UE assist for more than a few secs.   Standing balance support: Bilateral upper extremity supported Standing balance-Leahy Scale: Poor Standing balance comment: 3 standing trials with assist to come forward, support or guarding of L knee.  Work on L knee ext.  upright posture and w/shifting.                    Cognition Arousal/Alertness: Awake/alert Behavior During Therapy: Flat affect Overall Cognitive Status: Within Functional Limits for tasks assessed Area of Impairment:  (mild attention deficit)   Current Attention Level: Selective           General Comments: Pt able to follow one and two step commands consistently. Cognition appears [facility]FL for basic tasks     Exercises General Exercises - Upper Extremity Shoulder Flexion: AROM;AAROM;Right;Left;10 reps;Supine Wrist Extension: AAROM;Left;10 reps;Supine Digit Composite Flexion: AAROM;Left;10 reps;Supine Other Exercises Other Exercises: warm up hip/knee flexion/ext exercise prior to mobility    General Comments        Pertinent Vitals/Pain Pain Assessment: Faces Pain Score: 6  Faces Pain Scale: Hurts even more Pain  Location: back Pain Descriptors / Indicators: Aching Pain Intervention(s): Monitored during session;Repositioned    Home Living                      Prior Function            PT Goals (current goals can now be found in the care plan section) Acute Rehab PT Goals Patient Stated Goal: to get better  PT Goal Formulation: With patient Time For Goal  Achievement: 08/12/14 Progress towards PT goals: Progressing toward goals    Frequency  Min 3X/week    PT Plan Current plan remains appropriate    Co-evaluation             End of Session   Activity Tolerance: Patient tolerated treatment well Patient left: in chair;with call bell/phone within reach;with chair alarm set     Time: [id number] PT Time Calculation (min) (ACUTE ONLY): 32 min  Charges:  $Therapeutic Activity: 23-37 mins                    G Codes:      Arisa Congleton, Eliseo GumKenneth V 08/06/2014, 4:43 PM 08/06/2014  [place] BingKen Francisco Ostrovsky, PT [phone]  (pager)

## 2014-08-06 NOTE — Progress Notes (Signed)
36 Days Post-Op  Subjective: No significant changes No abdominal complaints, but she just had some pain meds Still with high-output drainage Patient does not remember the conversations from yesterday  Objective: Vital signs in last 24 hours: Temp:  [97.6 F (36.4 C)-98.1 F (36.7 C)] 98 F (36.7 C) (05/05 0700) Pulse Rate:  [111-119] 117 (05/05 0641) Resp:  [20-30] 22 (05/05 0641) BP: (89-124)/(43-78) 124/64 mmHg (05/05 0641) SpO2:  [94 %-100 %] 97 % (05/05 0910) Weight:  [77.3 kg (170 lb 6.7 oz)-81.9 kg (180 lb 8.9 oz)] 81.9 kg (180 lb 8.9 oz) (05/05 0401) Last BM Date: 07/31/14  Intake/Output from previous day: 05/04 0701 - 05/05 0700 In: 2433.3 [BJY:7829.5][TPN:2433.3] Out: 2592 [Drains:1950] Intake/Output this shift:    Abd: soft, minimallytender JP drain with lighter colored bilious output Wound is packed - some separation of sutures with some necrotic fat; no exposed bowel  Lab Results:   Recent Labs  08/04/14 0958 08/05/14 0400  WBC 14.0* 16.3*  HGB 9.6* 9.3*  HCT 30.6* 28.9*  PLT 255 285   BMET  Recent Labs  08/05/14 0400 08/06/14 0410  NA 130*  130* 135  K 3.4*  3.3* 3.3*  CL 88*  88* 93*  CO2 25  26 30   GLUCOSE 156*  156* 134*  BUN 81*  80* 41*  CREATININE 7.27*  7.28* 4.18*  CALCIUM 8.4*  8.4* 8.6*   PT/INR No results for input(s): LABPROT, INR in the last 72 hours. ABG No results for input(s): PHART, HCO3 in the last 72 hours.  Invalid input(s): PCO2, PO2  Studies/Results: No results found.  Anti-infectives: Anti-infectives    Start     Dose/Rate Route Frequency Ordered Stop   08/04/14 1200  vancomycin (VANCOCIN) 50 mg/mL oral solution 125 mg     125 mg Oral 4 times per day 08/04/14 0851     07/18/14 1800  vancomycin (VANCOCIN) 50 mg/mL oral solution 125 mg  Status:  Discontinued     125 mg Oral 4 times per day 07/18/14 1455 08/04/14 0845   07/17/14 1830  vancomycin (VANCOCIN) IVPB 750 mg/150 ml premix     750 mg 150 mL/hr over 60  Minutes Intravenous  Once 07/17/14 1818 07/18/14 0024   07/15/14 1400  metroNIDAZOLE (FLAGYL) tablet 500 mg  Status:  Discontinued     500 mg Oral 3 times per day 07/15/14 1202 07/18/14 1533   07/13/14 2200  piperacillin-tazobactam (ZOSYN) IVPB 2.25 g  Status:  Discontinued     2.25 g 100 mL/hr over 30 Minutes Intravenous 3 times per day 07/13/14 1318 07/24/14 1138   07/13/14 2000  fluconazole (DIFLUCAN) IVPB 200 mg  Status:  Discontinued     200 mg 100 mL/hr over 60 Minutes Intravenous Every 24 hours 07/13/14 1318 07/19/14 1027   07/07/14 1200  vancomycin (VANCOCIN) IVPB 1000 mg/200 mL premix  Status:  Discontinued     1,000 mg 200 mL/hr over 60 Minutes Intravenous Every 24 hours 07/07/14 0937 07/14/14 1712   07/06/14 2000  fluconazole (DIFLUCAN) IVPB 400 mg  Status:  Discontinued     400 mg 100 mL/hr over 120 Minutes Intravenous Every 24 hours 07/06/14 1507 07/13/14 1318   07/06/14 1800  piperacillin-tazobactam (ZOSYN) IVPB 2.25 g  Status:  Discontinued     2.25 g 100 mL/hr over 30 Minutes Intravenous 4 times per day 07/06/14 1505 07/13/14 1318   07/05/14 2000  fluconazole (DIFLUCAN) IVPB 200 mg  Status:  Discontinued     200  mg 100 mL/hr over 60 Minutes Intravenous Every 24 hours 07/05/14 0757 07/06/14 1507   07/05/14 1400  piperacillin-tazobactam (ZOSYN) IVPB 2.25 g  Status:  Discontinued     2.25 g 100 mL/hr over 30 Minutes Intravenous 3 times per day 07/05/14 0749 07/06/14 1505   07/02/14 1800  vancomycin (VANCOCIN) IVPB 1000 mg/200 mL premix  Status:  Discontinued     1,000 mg 200 mL/hr over 60 Minutes Intravenous Every 24 hours 07/01/14 1858 07/05/14 0801   07/01/14 2200  piperacillin-tazobactam (ZOSYN) IVPB 3.375 g  Status:  Discontinued     3.375 g 100 mL/hr over 30 Minutes Intravenous 4 times per day 07/01/14 1858 07/05/14 0749   07/01/14 2000  fluconazole (DIFLUCAN) IVPB 400 mg  Status:  Discontinued     400 mg 100 mL/hr over 120 Minutes Intravenous Every 24 hours 07/01/14  1858 07/05/14 0757   07/01/14 1400  fluconazole (DIFLUCAN) IVPB 200 mg  Status:  Discontinued     200 mg 100 mL/hr over 60 Minutes Intravenous Every 24 hours 07/01/14 1330 07/01/14 1853   07/01/14 1000  piperacillin-tazobactam (ZOSYN) IVPB 2.25 g  Status:  Discontinued     2.25 g 100 mL/hr over 30 Minutes Intravenous Every 8 hours 07/01/14 0952 07/01/14 1853   07/01/14 1000  vancomycin (VANCOCIN) 500 mg in sodium chloride 0.9 % 100 mL IVPB     500 mg 100 mL/hr over 60 Minutes Intravenous  Once 07/01/14 0952 07/01/14 1126   06/30/14 1200  vancomycin (VANCOCIN) IVPB 1000 mg/200 mL premix     1,000 mg 200 mL/hr over 60 Minutes Intravenous  Once 06/30/14 0944 06/30/14 1403   06/30/14 1200  ceFEPIme (MAXIPIME) 2 g in dextrose 5 % 50 mL IVPB     2 g 100 mL/hr over 30 Minutes Intravenous  Once 06/30/14 0944 06/30/14 1425   06/30/14 0100  vancomycin (VANCOCIN) 2,000 mg in sodium chloride 0.9 % 500 mL IVPB     2,000 mg 250 mL/hr over 120 Minutes Intravenous  Once 06/30/14 0048 06/30/14 0525   06/30/14 0100  ceFEPIme (MAXIPIME) 2 g in dextrose 5 % 50 mL IVPB     2 g 100 mL/hr over 30 Minutes Intravenous  Once 06/30/14 0048 06/30/14 0322   06/27/14 0600  cefUROXime (ZINACEF) 1.5 g in dextrose 5 % 50 mL IVPB     1.5 g 100 mL/hr over 30 Minutes Intravenous On call to O.R. 06/26/14 1019 06/27/14 0630   06/19/14 1800  oseltamivir (TAMIFLU) capsule 30 mg     30 mg Oral Every M-W-F (1800) 06/19/14 1003 06/22/14 1841   06/18/14 1600  oseltamivir (TAMIFLU) capsule 30 mg  Status:  Discontinued     30 mg Oral Daily 06/18/14 1538 06/19/14 1003      Assessment/Plan: s/p Procedure(s): EXPLORATORY LAPAROTOMY WITH CLOSURE OF PERFORATED PYLORIC ULCER (N/A) SMALL BOWEL RESECTION (N/A) POD #36 s/p Exploratory Laparotomy with graham patch closure of perforated pyloric ulcer, SBR for ischemic bowel - 07/01/2014 - Derrell LollingIngram Intra-abdominal fluid collection, no drain Now with duodenal fistula -CT 08/03/14 showed  3.5x5.7x3.0cm pre-rectal fluid collection. No drain was replaced in this pelvic fluid collection after previous drain accidentally pulled out -Fistula study done and shows JP drain near graham patch with connection to the duodenal bulb. - patient is currently being treated with NPO and TNA as she and her family have wished for aggressive treatment at this point. Strict NPO. I d/w her this morning that this may be a weeks to months long  treatment of NPO. She was very surprised by this, despite [place] talking to her about it before. I discussed her options of aggressive treatment with NPO vs more comfort approach with letting her eat and drink knowing this will not heal.  Another option would be to ask IR to place a feeding tube past the leak and using enteric feeds.  However, the patient keeps pulling out any NG tubes, so she would likely pull out the feeding tube. -NS WD dressing changes to abdominal wound. Would not replace VAC right now with the way the base of the wound looks -I&Os and routine drain care VTE prophylaxis-SCDs ID--Zosyn stopped on day #24 (07/24/14) C diff + -- Oral vanc New ESRD  PCM/TNA    LOS: 50 days    Iviana Blasingame K. 08/06/2014

## 2014-08-06 NOTE — Progress Notes (Signed)
Patient is refusing to wear CPAP tonight. States she does not like the mask. RT made patient aware that if she changed her mind to call RT and we would place her on CPAP.

## 2014-08-06 NOTE — Progress Notes (Signed)
Patient refused CPAP X 2 tonight.  States the mask is too tight.  Patient encouraged to call for respiratory if she desires to wear CPAP.

## 2014-08-06 NOTE — Progress Notes (Signed)
[name] Progress Note   Subjective: stable today, tired, didn't sleep good, sad about health problems  Filed Vitals:   08/06/14 0401 08/06/14 0606 08/06/14 0641 08/06/14 0700  BP:  94/57 124/64   Pulse:  116 117   Temp:    98 F (36.7 C)  TempSrc:    Oral  Resp:  25 22   Height:      Weight: 81.9 kg (180 lb 8.9 oz)     SpO2:  94% 97%    Exam: Up in chair, alert No jvd Chest clear bilat Abd soft VAC midline an drain RUQ connected to wall suction Ext no LE edema Access is R IJ cath and left arm AVF +bruit Neuro is ox 3, L arm stronger than a few weeks ago  HD: new start, MWF schedule in hospital        Assessment: 1. ESRD new start MWF inpatient, using cath as AVF poor fxn 2. CVA w left hemiparesis 3. S/P exlap for perf pyloric ulcer/ SBR for ischemia SB - w duodenal fistula drains to wall suction >1L/day 4. Abd wound - open, packing per surg 5. Nutrition - TNA for now 6. Volume - large drain outputs are balancing vol load with TNA, euvolemic to slightly dry 7. Pelvic fluid collection - s/p IR drain, out now 8. 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 Anemia cont darbe 11. 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 DM2 14. 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 Hypotension on midodrine 16. Hypokalemia - due to large GI losses, will ask pharm to replace/ follow this. 4K bath w HD. 17. Hypomagnesemia - better after IV supplementation  Plan - HD tomorrow, no hep, no fluid off, high K bath    Stephanie Moselleob Yuleidy Rappleye MD  pager [phone]    cell [phone]  08/06/2014, 8:04 AM     Recent Labs Lab 08/04/14 0754 08/05/14 0400 08/06/14 0410  NA 131* 130*  130* 135  K 3.4* 3.4*  3.3* 3.3*  CL 91* 88*  88* 93*  CO2 28 25  26 30   GLUCOSE 137* 156*  156* 134*  BUN 53* 81*  80* 41*  CREATININE 5.73* 7.27*  7.28* 4.18*  CALCIUM 8.4* 8.4*  8.4* 8.6*  PHOS 2.0* 4.4  4.4 1.8*    Recent Labs Lab 08/03/14 0500  08/04/14 0754 08/05/14 0400 08/06/14 0410  AST 29  --   --  31  ALT 17  --   --  18  ALKPHOS 148*  --   --  160*  BILITOT 1.1  --   --  1.0  PROT 7.3  --   --  7.3  ALBUMIN 2.0* 2.1* 2.0* 1.9*    Recent Labs Lab 08/03/14 0500 08/03/14 1630 08/04/14 0958 08/05/14 0400  WBC 11.2* 12.4* 14.0* 16.3*  NEUTROABS 7.8*  --   --   --   HGB 11.3* 10.7* 9.6* 9.3*  HCT 35.0* 33.1* 30.6* 28.9*  MCV 90.7 89.9 90.5 87.8  PLT 243 121* 255 285   . antiseptic oral rinse  7 mL Mouth Rinse q12n4p  . budesonide (PULMICORT) nebulizer solution  0.25 mg Nebulization BID  . chlorhexidine  15 mL Mouth Rinse BID  . darbepoetin (ARANESP) injection - DIALYSIS  100 mcg Intravenous Q Mon-HD  . insulin aspart  0-9 Units Subcutaneous 4 times per day  . levalbuterol  0.63 mg Nebulization  Q6H  . levothyroxine  12.5 mcg Intravenous Daily  . metoprolol  2.5 mg Intravenous 3 times per day  . sodium chloride  10-40 mL Intracatheter Q12H  . sodium phosphate  Dextrose 5% IVPB  30 mmol Intravenous Once  . vancomycin  125 mg Oral 4 times per day   . sodium chloride 10 mL/hr at 08/04/14 2300  . TPN (CLINIMIX) Adult without lytes 100 mL/hr at 08/05/14 1714   And  . fat emulsion 240 mL (08/05/14 1722)   bisacodyl, diphenhydrAMINE, fentaNYL (SUBLIMAZE) injection, ondansetron (ZOFRAN) IV, sodium chloride

## 2014-08-06 NOTE — Progress Notes (Signed)
Occupational Therapy Treatment Patient Details Name: [name] MRN: [id number] DOB: 21-Apr-1955 Today's Date: 08/06/2014    History of present illness [name] is a 59 y.o. female with a history of CKD and hypertension [facility] complaining of shortness of breath and generalized weakness x 2 weeks. Admitted with acute hypoxemic respiratory failure. During hospital stay noted to have weakness of her left side. 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  She developed septic shock 4/1 with pressors weaned off 4/3.  Began CRRT and pressors restarted 4/4; Developed pelvic abcess 4/7 with pelvic drain placed 4/8.  weaned off pressors 4/9.  Runs of SVT 4/5 and 4/11   OT comments  Limited participation due to fatigue- pt did not sleep well last night. Pt agreed to BUE exercise  Follow Up Recommendations       Equipment Recommendations       Recommendations for Other Services      Precautions / Restrictions         Mobility Bed Mobility            NT      Transfers              NT            ADL       Grooming: Wash/dry hands;Wash/dry face;Bed level                                        Vision                     Perception     Praxis      Cognition   Behavior During Therapy: Flat affect Overall Cognitive Status: Within Functional Limits for tasks assessed Area of Impairment: Attention   Current Attention Level: Selective            General Comments: Pt able to follow one and two step commands consistently. Cognition appears [facility] for basic tasks     Extremity/Trunk Assessment               Exercises General Exercises - Upper Extremity Shoulder Flexion: AROM;AAROM;Right;Left;10 reps;Supine Wrist Extension: AAROM;Left;10 reps;Supine Digit Composite Flexion:  AAROM;Left;10 reps;Supine   Shoulder Instructions       General Comments      Pertinent Vitals/ Pain       Pain Score: 6  Faces Pain Scale: Hurts even more Pain Location: belly area Pain Descriptors / Indicators: Aching  Home Living                                          Prior Functioning/Environment              Frequency       Progress Toward Goals  OT Goals(current goals can now be found in the care plan section)  Progress towards OT goals: Not progressing toward goals - comment (fatigue limiting pt this day)     Plan Discharge plan remains appropriate    Co-evaluation                 End of  Session Equipment Utilized During Treatment: Oxygen   Activity Tolerance Patient limited by fatigue;Patient limited by pain   Patient Left in bed;with call bell/phone within reach   Nurse Communication Mobility status        Time: [id number] OT Time Calculation (min): 8 min  Charges: OT General Charges $OT Visit: 1 Procedure OT Treatments $Therapeutic Exercise: 8-22 mins  [name], [name] 08/06/2014, 2:00 PM

## 2014-08-06 NOTE — Progress Notes (Signed)
NUTRITION FOLLOW-UP   DOCUMENTATION CODES Per approved criteria  -Obesity Unspecified   INTERVENTION:  Continue TPN per pharmacy.  NUTRITION DIAGNOSIS: Inadequate oral intake related to altered GI function as evidenced by limited intake, ongoing.   Goal: Pt to meet >/= 90% of their estimated nutrition needs, progressing  Monitor:  TPN, weight trends, labs, I/O's  ASSESSMENT: Pt with CKD and hypertension, presented on 3/16 with shortness of breath and generalized weakness x 2 weeks. Pt found to have metabolic acidosis from worsening uremia, H1N1 flu. Pt started on HD. 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  Pt s/p exp lap with closure of perforated pyloric ulcer, and small bowel resection. Received TPN until 4/15, then transitioned to a PO diet + supplements. 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 Fistula output 1950 ml x 24 hours. Pt remains NPO. Family and patient want to continue aggressive care.   Patient is receiving TPN with Clinimix 5/15 @ 100 ml/hr and lipids @ 10 ml/hr. Provides 2184 kcal, and 120 grams protein per day. Meets 100% minimum estimated energy needs and 100% minimum estimated protein needs.   Labs: Low potassium and phosphorus.   Height: Ht Readings from Last 1 Encounters:  08/02/14 5\' 6"  (1.676 m)    Weight: Wt Readings from Last 1 Encounters:  08/06/14 180 lb 8.9 oz (81.9 kg)   07/30/14 187 lb 13.3 oz (85.2 kg)       Usual weight: 220 lb (100 kg)  *Weight trending down*  BMI:  Body mass index is 29.16 kg/(m^2). Class I obesity  Re-Estimated Nutritional Needs: Kcal: 2100-2300 Protein: 120-140 grams Fluid: Per MD  Skin: incision on abdomen, stage II pressure ulcer on sacrum  Diet Order: Diet NPO time specified TPN (CLINIMIX) Adult without lytes TPN (CLINIMIX) Adult without lytes   Intake/Output Summary (Last 24 hours) at 08/06/14 1122 Last data filed at 08/06/14  0400  Gross per 24 hour  Intake 2433.32 ml  Output   2592 ml  Net -158.68 ml    Last BM: 4/29  Labs:   Recent Labs Lab 08/04/14 0754 08/05/14 0400 08/06/14 0410  NA 131* 130*  130* 135  K 3.4* 3.4*  3.3* 3.3*  CL 91* 88*  88* 93*  CO2 28 25  26 30   BUN 53* 81*  80* 41*  CREATININE 5.73* 7.27*  7.28* 4.18*  CALCIUM 8.4* 8.4*  8.4* 8.6*  MG 1.4* 1.8 2.0  PHOS 2.0* 4.4  4.4 1.8*  GLUCOSE 137* 156*  156* 134*    CBG (last 3)   Recent Labs  08/05/14 1757 08/06/14 0009 08/06/14 0533  GLUCAP 131* 162* 154*    Scheduled Meds: . antiseptic oral rinse  7 mL Mouth Rinse q12n4p  . budesonide (PULMICORT) nebulizer solution  0.25 mg Nebulization BID  . chlorhexidine  15 mL Mouth Rinse BID  . darbepoetin (ARANESP) injection - DIALYSIS  100 mcg Intravenous Q Mon-HD  . insulin aspart  0-9 Units Subcutaneous 4 times per day  . levalbuterol  0.63 mg Nebulization Q6H  . levothyroxine  12.5 mcg Intravenous Daily  . metoprolol  2.5 mg Intravenous 3 times per day  . potassium chloride  10 mEq Intravenous Q1 Hr x 2  . potassium phosphate IVPB (mmol)  15 mmol Intravenous Once  . sodium chloride  10-40 mL Intracatheter Q12H  . vancomycin  125 mg Oral 4 times per day  Continuous Infusions: . sodium chloride 10 mL/hr at 08/04/14 2300  . TPN (CLINIMIX) Adult without lytes 100 mL/hr at 08/05/14 1714   And  . fat emulsion 240 mL (08/05/14 1722)  . TPN (CLINIMIX) Adult without lytes     And  . fat emulsion      Joaquin CourtsKimberly Kassadee Carawan, RD, LDN, CNSC Pager [phone] After Hours Pager [phone]

## 2014-08-07 DIAGNOSIS — R1010 Upper abdominal pain, unspecified: Secondary | ICD-10-CM

## 2014-08-07 LAB — GLUCOSE, CAPILLARY
GLUCOSE-CAPILLARY: 132 mg/dL — AB (ref 70–99)
Glucose-Capillary: 153 mg/dL — ABNORMAL HIGH (ref 70–99)
Glucose-Capillary: 144 mg/dL — ABNORMAL HIGH (ref 70–99)

## 2014-08-07 LAB — BASIC METABOLIC PANEL
Anion gap: 16 — ABNORMAL HIGH (ref 5–15)
BUN: 79 mg/dL — AB (ref 6–20)
CO2: 29 mmol/L (ref 22–32)
CREATININE: 6.05 mg/dL — AB (ref 0.44–1.00)
Calcium: 8.7 mg/dL — ABNORMAL LOW (ref 8.9–10.3)
Chloride: 84 mmol/L — ABNORMAL LOW (ref 101–111)
GFR calc Af Amer: 8 mL/min — ABNORMAL LOW (ref >60)
GFR calc non Af Amer: 7 mL/min — ABNORMAL LOW (ref >60)
Glucose, Bld: 134 mg/dL — ABNORMAL HIGH (ref 70–99)
Potassium: 3.1 mmol/L — ABNORMAL LOW (ref 3.5–5.1)
Sodium: 129 mmol/L — ABNORMAL LOW (ref 135–145)

## 2014-08-07 LAB — PHOSPHORUS: Phosphorus: 2.2 mg/dL — ABNORMAL LOW (ref 2.5–4.6)

## 2014-08-07 LAB — CBC
HEMATOCRIT: 28.8 % — AB (ref 36.0–46.0)
Hemoglobin: 9.3 g/dL — ABNORMAL LOW (ref 12.0–15.0)
MCH: 28 pg (ref 26.0–34.0)
MCHC: 32.3 g/dL (ref 30.0–36.0)
MCV: 86.7 fL (ref 78.0–100.0)
Platelets: 312 10*3/uL (ref 150–400)
RBC: 3.32 MIL/uL — ABNORMAL LOW (ref 3.87–5.11)
RDW: 21.4 % — AB (ref 11.5–15.5)
WBC: 15.5 10*3/uL — ABNORMAL HIGH (ref 4.0–10.5)

## 2014-08-07 MED ORDER — FAT EMULSION 20 % IV EMUL
240.0000 mL | INTRAVENOUS | Status: AC
  Administered 2014-08-07: 240 mL via INTRAVENOUS
  Filled 2014-08-07: qty 250

## 2014-08-07 MED ORDER — ALTEPLASE 100 MG IV SOLR
5.0000 mg | INTRAVENOUS | Status: AC
  Filled 2014-08-07: qty 5

## 2014-08-07 MED ORDER — POTASSIUM CHLORIDE 10 MEQ/50ML IV SOLN
10.0000 meq | INTRAVENOUS | Status: AC
  Administered 2014-08-07: 10 meq via INTRAVENOUS
  Filled 2014-08-07: qty 50

## 2014-08-07 MED ORDER — POTASSIUM CHLORIDE 10 MEQ/50ML IV SOLN
10.0000 meq | INTRAVENOUS | Status: AC
  Administered 2014-08-07 (×3): 10 meq via INTRAVENOUS
  Filled 2014-08-07 (×4): qty 50

## 2014-08-07 MED ORDER — SODIUM PHOSPHATE 3 MMOLE/ML IV SOLN
20.0000 mmol | Freq: Once | INTRAVENOUS | Status: AC
  Administered 2014-08-07: 20 mmol via INTRAVENOUS
  Filled 2014-08-07 (×2): qty 6.67

## 2014-08-07 MED ORDER — TRACE MINERALS CR-CU-F-FE-I-MN-MO-SE-ZN IV SOLN
INTRAVENOUS | Status: AC
  Administered 2014-08-07: 18:00:00 via INTRAVENOUS
  Filled 2014-08-07: qty 2400

## 2014-08-07 MED ORDER — SODIUM CHLORIDE 0.9 % IV BOLUS (SEPSIS)
500.0000 mL | Freq: Once | INTRAVENOUS | Status: AC
  Administered 2014-08-07: 500 mL via INTRAVENOUS

## 2014-08-07 NOTE — Progress Notes (Signed)
Pt heart rate down to 110s. Bolus in progress. Will continue to monitor.

## 2014-08-07 NOTE — Progress Notes (Signed)
Received call from central telemetry about EKG rhythm change. Patient heart rate in the 130s. BP 83/55, patient is in no apparent distress. [id number] performed 12-lead EKG. Paged [facility] at [phone]. 0525 orders for half liter bolus, will administer and continue to monitor.

## 2014-08-07 NOTE — Progress Notes (Signed)
Utilization review completed.  

## 2014-08-07 NOTE — Progress Notes (Signed)
[name], female   DOB: Mar 18, 1956, 59 y.o.   MRN: [id number]     [place]      [address] [place], [place] [address]    Phone: [phone] FAX: [phone]     Subjective: Sore and tired.  abd pain.  HR up, bp stable, given lopressor.  Elytes low.  Dialysis today. Afebrile.    2127ml out of JP drain.     Objective:  Vital signs:  Filed Vitals:   08/07/14 0511 08/07/14 0530 08/07/14 0600 08/07/14 0630  BP: 83/55 88/61 107/61 95/69  Pulse: 132 113 131 114  Temp:      TempSrc:      Resp: $Remo'23 23 24 25  'AYwUb$ Height:      Weight:      SpO2: 95% 97% 98% 96%    Last BM Date: 07/31/14  Intake/Output   Yesterday:  05/05 0701 - 05/06 0700 In: -  Out: 2100 [Drains:2100] This shift: I/O last 3 completed shifts: In: 1197.7  Out: 3150 [Drains:3150]       Physical Exam: General: Pt awake/alert/oriented x4 in no acute distress Abdomen: Soft.  Nondistended.  ttp to upper abdomen.  Midline wound is c/d/i.  JP drain to wall suction with bilious output.   No evidence of peritonitis.  No incarcerated hernias.    Problem List:   Principal Problem:   Acute respiratory failure with hypoxia Active Problems:   Cerebral thrombosis with cerebral infarction   Hypertension   ESRD needing dialysis   Obesity (BMI 30-39.9)   Depression   Left renal mass   Chronic diastolic heart failure   P5T6 influenza   Tobacco use disorder   Renal failure (ARF), acute on chronic   Hyperlipidemia   PSVT (paroxysmal supraventricular tachycardia)   SOB (shortness of breath)   Perforated gastric ulcer   Abnormal TSH   History of intermediate V/Q scan   HIT (heparin-induced thrombocytopenia)   Fistula   Abdominal pain   Palliative care encounter   ESRD (end stage renal disease)   SVT (supraventricular tachycardia)   Right heart failure    Results:   Labs: Results for orders placed or performed during the hospital  encounter of 06/17/14 (from the past 48 hour(s))  Glucose, capillary     Status: Abnormal   Collection Time: 08/05/14  1:56 PM  Result Value Ref Range   Glucose-Capillary 146 (H) 70 - 99 mg/dL   Comment 1 Notify RN    Comment 2 Document in Chart   Glucose, capillary     Status: Abnormal   Collection Time: 08/05/14  3:07 PM  Result Value Ref Range   Glucose-Capillary 134 (H) 70 - 99 mg/dL  Glucose, capillary     Status: Abnormal   Collection Time: 08/05/14  5:57 PM  Result Value Ref Range   Glucose-Capillary 131 (H) 70 - 99 mg/dL  Glucose, capillary     Status: Abnormal   Collection Time: 08/06/14 12:09 AM  Result Value Ref Range   Glucose-Capillary 162 (H) 70 - 99 mg/dL  Comprehensive metabolic panel     Status: Abnormal   Collection Time: 08/06/14  4:10 AM  Result Value Ref Range   Sodium 135 135 - 145 mmol/L   Potassium 3.3 (L) 3.5 - 5.1 mmol/L   Chloride 93 (L) 101 - 111 mmol/L   CO2 30 22 - 32 mmol/L   Glucose, Bld 134 (H) 70 - 99 mg/dL  BUN 41 (H) 6 - 20 mg/dL   Creatinine, Ser 4.18 (H) 0.44 - 1.00 mg/dL    Comment: DELTA CHECK NOTED   Calcium 8.6 (L) 8.9 - 10.3 mg/dL   Total Protein 7.3 6.5 - 8.1 g/dL   Albumin 1.9 (L) 3.5 - 5.0 g/dL   AST 31 15 - 41 U/L   ALT 18 14 - 54 U/L   Alkaline Phosphatase 160 (H) 38 - 126 U/L   Total Bilirubin 1.0 0.3 - 1.2 mg/dL   GFR calc non Af Amer 11 (L) >60 mL/min   GFR calc Af Amer 13 (L) >60 mL/min    Comment: (NOTE) The eGFR has been calculated using the CKD EPI equation. This calculation has not been validated in all clinical situations. eGFR's persistently <90 mL/min signify possible Chronic Kidney Disease.    Anion gap 12 5 - 15  Magnesium     Status: None   Collection Time: 08/06/14  4:10 AM  Result Value Ref Range   Magnesium 2.0 1.7 - 2.4 mg/dL  Phosphorus     Status: Abnormal   Collection Time: 08/06/14  4:10 AM  Result Value Ref Range   Phosphorus 1.8 (L) 2.5 - 4.6 mg/dL  Glucose, capillary     Status: Abnormal    Collection Time: 08/06/14  5:33 AM  Result Value Ref Range   Glucose-Capillary 154 (H) 70 - 99 mg/dL  Glucose, capillary     Status: Abnormal   Collection Time: 08/06/14 12:10 PM  Result Value Ref Range   Glucose-Capillary 164 (H) 70 - 99 mg/dL  Potassium     Status: None   Collection Time: 08/06/14  6:20 PM  Result Value Ref Range   Potassium 3.5 3.5 - 5.1 mmol/L  Glucose, capillary     Status: Abnormal   Collection Time: 08/06/14  6:50 PM  Result Value Ref Range   Glucose-Capillary 116 (H) 70 - 99 mg/dL   Comment 1 Notify RN    Comment 2 Document in Chart   MRSA PCR Screening     Status: None   Collection Time: 08/06/14  6:52 PM  Result Value Ref Range   MRSA by PCR NEGATIVE NEGATIVE    Comment:        The GeneXpert MRSA Assay (FDA approved for NASAL specimens only), is one component of a comprehensive MRSA colonization surveillance program. It is not intended to diagnose MRSA infection nor to guide or monitor treatment for MRSA infections.   Glucose, capillary     Status: Abnormal   Collection Time: 08/06/14 11:25 PM  Result Value Ref Range   Glucose-Capillary 143 (H) 70 - 99 mg/dL  Basic metabolic panel     Status: Abnormal   Collection Time: 08/07/14  4:20 AM  Result Value Ref Range   Sodium 129 (L) 135 - 145 mmol/L   Potassium 3.1 (L) 3.5 - 5.1 mmol/L   Chloride 84 (L) 101 - 111 mmol/L   CO2 29 22 - 32 mmol/L   Glucose, Bld 134 (H) 70 - 99 mg/dL   BUN 79 (H) 6 - 20 mg/dL   Creatinine, Ser 6.05 (H) 0.44 - 1.00 mg/dL   Calcium 8.7 (L) 8.9 - 10.3 mg/dL   GFR calc non Af Amer 7 (L) >60 mL/min   GFR calc Af Amer 8 (L) >60 mL/min    Comment: (NOTE) The eGFR has been calculated using the CKD EPI equation. This calculation has not been validated in all clinical situations. eGFR's persistently <60  mL/min signify possible Chronic Kidney Disease.    Anion gap 16 (H) 5 - 15  CBC     Status: Abnormal   Collection Time: 08/07/14  4:20 AM  Result Value Ref Range    WBC 15.5 (H) 4.0 - 10.5 K/uL   RBC 3.32 (L) 3.87 - 5.11 MIL/uL   Hemoglobin 9.3 (L) 12.0 - 15.0 g/dL   HCT 28.8 (L) 36.0 - 46.0 %   MCV 86.7 78.0 - 100.0 fL   MCH 28.0 26.0 - 34.0 pg   MCHC 32.3 30.0 - 36.0 g/dL   RDW 21.4 (H) 11.5 - 15.5 %   Platelets 312 150 - 400 K/uL  Glucose, capillary     Status: Abnormal   Collection Time: 08/07/14  5:47 AM  Result Value Ref Range   Glucose-Capillary 153 (H) 70 - 99 mg/dL    Imaging / Studies: No results found.  Medications / Allergies:  Scheduled Meds: . antiseptic oral rinse  7 mL Mouth Rinse q12n4p  . budesonide (PULMICORT) nebulizer solution  0.25 mg Nebulization BID  . chlorhexidine  15 mL Mouth Rinse BID  . darbepoetin (ARANESP) injection - DIALYSIS  100 mcg Intravenous Q Mon-HD  . insulin aspart  0-9 Units Subcutaneous 4 times per day  . levothyroxine  12.5 mcg Intravenous Daily  . metoprolol  2.5 mg Intravenous 3 times per day  . potassium chloride  10 mEq Intravenous Q1 Hr x 3  . sodium chloride  10-40 mL Intracatheter Q12H  . vancomycin  125 mg Oral 4 times per day   Continuous Infusions: . sodium chloride 10 mL/hr at 08/04/14 2300  . TPN (CLINIMIX) Adult without lytes 100 mL/hr at 08/06/14 1738   And  . fat emulsion 240 mL (08/06/14 1739)   PRN Meds:.sodium chloride, sodium chloride, bisacodyl, diphenhydrAMINE, feeding supplement (NEPRO CARB STEADY), fentaNYL (SUBLIMAZE) injection, levalbuterol, lidocaine (PF), lidocaine-prilocaine, ondansetron (ZOFRAN) IV, pentafluoroprop-tetrafluoroeth, sodium chloride  Antibiotics: Anti-infectives    Start     Dose/Rate Route Frequency Ordered Stop   08/04/14 1200  vancomycin (VANCOCIN) 50 mg/mL oral solution 125 mg     125 mg Oral 4 times per day 08/04/14 0851     07/18/14 1800  vancomycin (VANCOCIN) 50 mg/mL oral solution 125 mg  Status:  Discontinued     125 mg Oral 4 times per day 07/18/14 1455 08/04/14 0845   07/17/14 1830  vancomycin (VANCOCIN) IVPB 750 mg/150 ml premix      750 mg 150 mL/hr over 60 Minutes Intravenous  Once 07/17/14 1818 07/18/14 0024   07/15/14 1400  metroNIDAZOLE (FLAGYL) tablet 500 mg  Status:  Discontinued     500 mg Oral 3 times per day 07/15/14 1202 07/18/14 1533   07/13/14 2200  piperacillin-tazobactam (ZOSYN) IVPB 2.25 g  Status:  Discontinued     2.25 g 100 mL/hr over 30 Minutes Intravenous 3 times per day 07/13/14 1318 07/24/14 1138   07/13/14 2000  fluconazole (DIFLUCAN) IVPB 200 mg  Status:  Discontinued     200 mg 100 mL/hr over 60 Minutes Intravenous Every 24 hours 07/13/14 1318 07/19/14 1027   07/07/14 1200  vancomycin (VANCOCIN) IVPB 1000 mg/200 mL premix  Status:  Discontinued     1,000 mg 200 mL/hr over 60 Minutes Intravenous Every 24 hours 07/07/14 0937 07/14/14 1712   07/06/14 2000  fluconazole (DIFLUCAN) IVPB 400 mg  Status:  Discontinued     400 mg 100 mL/hr over 120 Minutes Intravenous Every 24 hours 07/06/14 1507 07/13/14  1318   07/06/14 1800  piperacillin-tazobactam (ZOSYN) IVPB 2.25 g  Status:  Discontinued     2.25 g 100 mL/hr over 30 Minutes Intravenous 4 times per day 07/06/14 1505 07/13/14 1318   07/05/14 2000  fluconazole (DIFLUCAN) IVPB 200 mg  Status:  Discontinued     200 mg 100 mL/hr over 60 Minutes Intravenous Every 24 hours 07/05/14 0757 07/06/14 1507   07/05/14 1400  piperacillin-tazobactam (ZOSYN) IVPB 2.25 g  Status:  Discontinued     2.25 g 100 mL/hr over 30 Minutes Intravenous 3 times per day 07/05/14 0749 07/06/14 1505   07/02/14 1800  vancomycin (VANCOCIN) IVPB 1000 mg/200 mL premix  Status:  Discontinued     1,000 mg 200 mL/hr over 60 Minutes Intravenous Every 24 hours 07/01/14 1858 07/05/14 0801   07/01/14 2200  piperacillin-tazobactam (ZOSYN) IVPB 3.375 g  Status:  Discontinued     3.375 g 100 mL/hr over 30 Minutes Intravenous 4 times per day 07/01/14 1858 07/05/14 0749   07/01/14 2000  fluconazole (DIFLUCAN) IVPB 400 mg  Status:  Discontinued     400 mg 100 mL/hr over 120 Minutes  Intravenous Every 24 hours 07/01/14 1858 07/05/14 0757   07/01/14 1400  fluconazole (DIFLUCAN) IVPB 200 mg  Status:  Discontinued     200 mg 100 mL/hr over 60 Minutes Intravenous Every 24 hours 07/01/14 1330 07/01/14 1853   07/01/14 1000  piperacillin-tazobactam (ZOSYN) IVPB 2.25 g  Status:  Discontinued     2.25 g 100 mL/hr over 30 Minutes Intravenous Every 8 hours 07/01/14 0952 07/01/14 1853   07/01/14 1000  vancomycin (VANCOCIN) 500 mg in sodium chloride 0.9 % 100 mL IVPB     500 mg 100 mL/hr over 60 Minutes Intravenous  Once 07/01/14 0952 07/01/14 1126   06/30/14 1200  vancomycin (VANCOCIN) IVPB 1000 mg/200 mL premix     1,000 mg 200 mL/hr over 60 Minutes Intravenous  Once 06/30/14 0944 06/30/14 1403   06/30/14 1200  ceFEPIme (MAXIPIME) 2 g in dextrose 5 % 50 mL IVPB     2 g 100 mL/hr over 30 Minutes Intravenous  Once 06/30/14 0944 06/30/14 1425   06/30/14 0100  vancomycin (VANCOCIN) 2,000 mg in sodium chloride 0.9 % 500 mL IVPB     2,000 mg 250 mL/hr over 120 Minutes Intravenous  Once 06/30/14 0048 06/30/14 0525   06/30/14 0100  ceFEPIme (MAXIPIME) 2 g in dextrose 5 % 50 mL IVPB     2 g 100 mL/hr over 30 Minutes Intravenous  Once 06/30/14 0048 06/30/14 0322   06/27/14 0600  cefUROXime (ZINACEF) 1.5 g in dextrose 5 % 50 mL IVPB     1.5 g 100 mL/hr over 30 Minutes Intravenous On call to O.R. 06/26/14 1019 06/27/14 0630   06/19/14 1800  oseltamivir (TAMIFLU) capsule 30 mg     30 mg Oral Every M-W-F (1800) 06/19/14 1003 06/22/14 1841   06/18/14 1600  oseltamivir (TAMIFLU) capsule 30 mg  Status:  Discontinued     30 mg Oral Daily 06/18/14 1538 06/19/14 1003        Assessment/Plan: POD #37 s/p Exploratory Laparotomy with graham patch closure of perforated pyloric ulcer, SBR for ischemic bowel - 07/01/2014 - Dalbert Batman Intra-abdominal fluid collection, no drain Now with duodenal fistula -CT 08/03/14 showed 3.5x5.7x3.0cm pre-rectal fluid collection. No drain was replaced in this pelvic  fluid collection after previous drain accidentally pulled out -Fistula study done and shows JP drain near graham patch with connection to the  duodenal bulb. -strict NPO, may benefit from NGT to help reduce the JP drainage, but pt refusing and pulled out previous NGTs. -could consider IR placing a feeding tube past the leak -NS wet to dry dressing changes -I&Os and routine drain care VTE prophylaxis-SCDs ID--Zosyn stopped on day #24 (07/24/14) C diff + -- Oral vanc New ESRD  PCM/TNA    Erby Pian, ANP-BC [facility] Pager [phone](7A-4:30P) For consults and floor pages call [phone](7A-4:30P)  08/07/2014 7:55 AM

## 2014-08-07 NOTE — Progress Notes (Addendum)
I met with [name] today who is becoming increasingly frustrated by her NPO status. We acknowledged that it is a difficult decision to choose between quality and quantity of life. She tells me that she feels strong most of the time but is tearful other times. She is worried that she has lost all her independence and that she will be a burden to her children in this state. We agreed that she will continue to pray and when she feels she has an answer and is accepting of her poor prognosis she will let us know and we can help her maintain comfort and peace. She says she is not scared to die but does not want to leave her children - but acknowledges she knows this day will come and they will be ok. Continue aggressive care. I did not discuss code status today.   Vinie Sill, NP Palliative Medicine Team Pager # [phone] (M-F 8a-5p) Team Phone # [phone] (Nights/Weekends)

## 2014-08-07 NOTE — Progress Notes (Signed)
[facility] Progress Note   Subjective: no complaints  Filed Vitals:   08/07/14 0931 08/07/14 0945 08/07/14 1000 08/07/14 1018  BP: 75/48 61/42 80/53  82/48  Pulse: 109 112 112 120  Temp:      TempSrc:      Resp: 25 25 15 24   Height:      Weight:      SpO2: 95% 95% 98% 93%   Exam: Up in chair, alert No jvd Chest clear bilat Abd soft VAC midline an drain RUQ connected to wall suction Ext no LE edema Access is R IJ cath and left arm AVF +bruit Neuro is ox 3, L arm stronger than a few weeks ago  HD: new start, MWF schedule in hospital        Assessment: 1. ESRD new start MWF inpatient, using cath as AVF poor fxn 2. CVA w left hemiparesis 3. 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 Abd wound - open, packing per surg 5. Nutrition - TNA for now 6. Volume - large drain outputs have balanced vol load from TNA 7. Pelvic fluid collection - s/p IR drain, out now 8. 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 Anemia cont darbe 11. 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 DM2 14. 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 Hypotension on midodrine 16. Hypokalemia - due to large GI losses, pharm following. 4K bath w HD. 17. Hypomagnesemia - better after IV supplementation  Plan - HD today     Vinson Moselleob Arzell Mcgeehan MD  pager [phone]    cell [phone]  08/07/2014, 10:56 AM     Recent Labs Lab 08/05/14 0400 08/06/14 0410 08/06/14 1820 08/07/14 0420 08/07/14 0805  NA 130*  130* 135  --  129*  --   K 3.4*  3.3* 3.3* 3.5 3.1*  --   CL 88*  88* 93*  --  84*  --   CO2 25  26 30   --  29  --   GLUCOSE 156*  156* 134*  --  134*  --   BUN 81*  80* 41*  --  79*  --   CREATININE 7.27*  7.28* 4.18*  --  6.05*  --   CALCIUM 8.4*  8.4* 8.6*  --  8.7*  --   PHOS 4.4  4.4 1.8*  --   --  2.2*    Recent Labs Lab 08/03/14 0500 08/04/14 0754 08/05/14 0400 08/06/14 0410   AST 29  --   --  31  ALT 17  --   --  18  ALKPHOS 148*  --   --  160*  BILITOT 1.1  --   --  1.0  PROT 7.3  --   --  7.3  ALBUMIN 2.0* 2.1* 2.0* 1.9*    Recent Labs Lab 08/03/14 0500  08/04/14 0958 08/05/14 0400 08/07/14 0420  WBC 11.2*  < > 14.0* 16.3* 15.5*  NEUTROABS 7.8*  --   --   --   --   HGB 11.3*  < > 9.6* 9.3* 9.3*  HCT 35.0*  < > 30.6* 28.9* 28.8*  MCV 90.7  < > 90.5 87.8 86.7  PLT 243  < > 255 285 312  < > = values in this interval not displayed. Marland Kitchen. alteplase  5 mg Intracatheter Once  . antiseptic  oral rinse  7 mL Mouth Rinse q12n4p  . budesonide (PULMICORT) nebulizer solution  0.25 mg Nebulization BID  . chlorhexidine  15 mL Mouth Rinse BID  . darbepoetin (ARANESP) injection - DIALYSIS  100 mcg Intravenous Q Mon-HD  . insulin aspart  0-9 Units Subcutaneous 4 times per day  . levothyroxine  12.5 mcg Intravenous Daily  . metoprolol  2.5 mg Intravenous 3 times per day  . potassium chloride  10 mEq Intravenous Q1 Hr x 3  . potassium chloride  10 mEq Intravenous Q1 Hr x 3  . sodium chloride  10-40 mL Intracatheter Q12H  . sodium phosphate  Dextrose 5% IVPB  20 mmol Intravenous Once  . vancomycin  125 mg Oral 4 times per day   . sodium chloride 10 mL/hr at 08/04/14 2300  . TPN (CLINIMIX) Adult without lytes 100 mL/hr at 08/06/14 1738   And  . fat emulsion 240 mL (08/06/14 1739)  . TPN (CLINIMIX) Adult without lytes     And  . fat emulsion     sodium chloride, sodium chloride, bisacodyl, diphenhydrAMINE, feeding supplement (NEPRO CARB STEADY), fentaNYL (SUBLIMAZE) injection, levalbuterol, lidocaine (PF), lidocaine-prilocaine, ondansetron (ZOFRAN) IV, pentafluoroprop-tetrafluoroeth, sodium chloride

## 2014-08-07 NOTE — Progress Notes (Signed)
PARENTERAL NUTRITION CONSULT NOTE - FOLLOW UP  Pharmacy Consult for TPN Indication:  High output Duodenal Fistula  No Known Allergies  Patient Measurements: Height: 5\' 6"  (167.6 cm) Weight: 192 lb 3.9 oz (87.2 kg) IBW/kg (Calculated) : 59.3 Adjusted Body Weight:  Usual Weight:   Vital Signs: Temp: 97.4 F (36.3 C) (05/06 0345) Temp Source: Axillary (05/06 0345) BP: 90/58 mmHg (05/06 0753) Pulse Rate: 110 (05/06 0753) Intake/Output from previous day: 05/05 0701 - 05/06 0700 In: -  Out: 2100 [Drains:2100] Intake/Output from this shift:    Labs:  Recent Labs  08/04/14 0958 08/05/14 0400 08/07/14 0420  WBC 14.0* 16.3* 15.5*  HGB 9.6* 9.3* 9.3*  HCT 30.6* 28.9* 28.8*  PLT 255 285 312     Recent Labs  08/05/14 0400 08/06/14 0410 08/06/14 1820 08/07/14 0420 08/07/14 0805  NA 130*  130* 135  --  129*  --   K 3.4*  3.3* 3.3* 3.5 3.1*  --   CL 88*  88* 93*  --  84*  --   CO2 25  26 30   --  29  --   GLUCOSE 156*  156* 134*  --  134*  --   BUN 81*  80* 41*  --  79*  --   CREATININE 7.27*  7.28* 4.18*  --  6.05*  --   CALCIUM 8.4*  8.4* 8.6*  --  8.7*  --   MG 1.8 2.0  --   --   --   PHOS 4.4  4.4 1.8*  --   --  2.2*  PROT  --  7.3  --   --   --   ALBUMIN 2.0* 1.9*  --   --   --   AST  --  31  --   --   --   ALT  --  18  --   --   --   ALKPHOS  --  160*  --   --   --   BILITOT  --  1.0  --   --   --    Estimated Creatinine Clearance: 11.3 mL/min (by C-G formula based on Cr of 6.05).    Recent Labs  08/06/14 1850 08/06/14 2325 08/07/14 0547  GLUCAP 116* 143* 153*    Medications:  Scheduled:  . antiseptic oral rinse  7 mL Mouth Rinse q12n4p  . budesonide (PULMICORT) nebulizer solution  0.25 mg Nebulization BID  . chlorhexidine  15 mL Mouth Rinse BID  . darbepoetin (ARANESP) injection - DIALYSIS  100 mcg Intravenous Q Mon-HD  . insulin aspart  0-9 Units Subcutaneous 4 times per day  . levothyroxine  12.5 mcg Intravenous Daily  . metoprolol   2.5 mg Intravenous 3 times per day  . potassium chloride  10 mEq Intravenous Q1 Hr x 3  . sodium chloride  10-40 mL Intracatheter Q12H  . vancomycin  125 mg Oral 4 times per day     Insulin Requirements in the past 24 hours:  5 units Novolog SSI  Current Nutrition:  NPO Clinimix 5/15 (no electrolytes) at 179ml/hr and 20% IVFE at 71ml/hr which will provide 1843 kcal and 96 gm protein.  Nutritional Goals: (per RD note 4/28) 2100-2300 kCal, 120-140 grams of protein per day Goal: Clinimix 5/15 at 119ml/hr + 20% IVFE 8ml/hr to provide 2184 kcal and 120 gm protein  Assessment: 76 YOF with complicated hospital course. S/p closure perforated pyloric ulcer, SBR for ischemic necrosis, diffuse peritonitis on 07/01/14. Pharmacy  consulted to provide TPN 4/1-4/15 for nutrition support for prolonged ileus. Pt then transitioned to po diet + supplements. 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  GI: Duodenal fistula. NPO. Open abd wound, (+)abd pain. Fistula output 2100 ml/24h. Albumin 2.0. Prealbumin remains low at 9.8 Noted palliative care discussions ongoing but for now pt/family wish for aggressive care. NGT out and pt refuses replacement. *TPN stopped 5/2 at 1440 during rapid response event per Dr Jonnie Finner request due to volume overload. Now TPN running at 100 ml/hr and lipids at 10 ml/hr with 5/5 note stating euvolemic to slightly dry  Endo: h/o DM. CBGs acceptable, minimal SSI, had low CBGs prior to TPN start.   Lytes: none in TPN since pt on iHD. Na 129; K 3.1 (AFib, goal >= 4)- s/p 27mEq 5/5 & down from PM check of 3.5 on 5/5, Phos 2.2- improved s/p 57mmol 5/5, Mg 2 (AFib, goal >= 2), Ca-corr 10.4. Dr Jonnie Finner called 5/5 & asked pharmacy to manage K replacement; Current orders for 4K HD bath, next HD today  Renal: New ESRD - started 3/17. HD on M/W/F. HD x 4hr Wed with BFR 300-400. In HD currently. Aranesp 100 qMon (decr from 150 2nd Hg). Hg 9.3 on  5/4.  Pulm: asthma, COPD. RA. CT negative for PE. CCM signed off 5/3  Cards: HTN. (afib). Cards signed off 4/26. Midodrine and amio on hold (no NGT and NPO) IV metoprolol. CHADS-VASc =4 (not good candidate for [facility]).  Hepatotobil: Alk phos slightly elevated with upward trend, other LFTs wnl. TG 177 (5/2)  Neuro: hx depression / insomnia - new stroke noted on 3/18.   ID: Pt continues on PO Vanc D#13/14 for cdiff colitis, no stop date on MAR . Diarrhea is better. WBC up to 16.3 on 5/4  Best Practices: SCDs, PPI IV  TPN Access: PICC line in IR 4/27 TPN start date: 4/1->4/15; restart 4/27>>  Plan:  - give Clinimix 5/15 no electrolytes at 100/hr and 20% IVFE at 74ml/hr. This will provide 100% support.  -  Consider giving TPN with electrolytes intermittently, but will not do so today as I have already given pt 68mEq of K this AM - repeat K run x 3 for total 6 runs today, as 3-4 runs/day have not been moving her K - Na-Phos 62mmol today, in 226mL over 6hr - Trace elements and MVI daily in TPN.  -  Continue sensitive SSI q6h - BMet, Phos in AM  Gracy Bruins, PharmD Clinical Pharmacist [facility]

## 2014-08-07 NOTE — Progress Notes (Addendum)
Progress Note [facility].   [name] NFA:[id number] DOB: 12-Mar-1956 DOA: 06/17/2014 PCP: [name], MD  Admit HPI / Brief Narrative: 59 yo female smoker with hx HTN, ESRD on HD (just began this admit), COPD, failed L AV fistula, initially admitted 3/16 with flu and acute hypoxic resp failure. Ultimately tx to [facility] due to need for HD. 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   Patient on 3/31 was taking to the OR for ex lap for per pyloric ulcer and sm bowel resection. Patient develops septic shock on 4-1, she was started on pressors and wean off pressors on 4-3. Subsequently she was diagnosed with pelvic abscess by CT scan perform on 4-7. She underwent pelvic drain placement by IR. Patient also develops C. diff diagnosed 4-12.  C diff was not responding flagyl, and she was started on vancomycin 4-16. She also develops  run of SVT. Cardiology has been helping with management. Course complicated with high RV pressure and concerns for PE; . CT angio negative for PE. Also with fistula formation between anastomosis and blake drain, making her back to NPO state and in need of TPN. Family want everything to be done. Palliative care consulted and following. Currently Full code. Patient on 5-2 develops hypoxemia, hypotension and SVT during dialysis treatment. Dialysis was stop. She received IV bolus. She was started on BIPAP. She was transfer to Step down unit. CCM and cardiology re consulted.Her Hr decrease, BP improved. She was taken off BIPAP. She also develop new epigastric pain 5-02. Plan is to repeat CT abdomen. Surgery following.    HPI/Subjective: Angry when i talked to her about goals of care, wants to keep going despite very poor prognosis  Assessment/Plan: Acute hypoxic Respiratory Failure:  -2nd to H1N1, hypervolemia, HCAP >> resolved.  -also has COPD and PAH -improving, now weaned off O2 -Nebulizer PRN and CPAP QHS   Septic shock  -due to perforated  prepyloric ulcer, requiring pressors -resolved  C. difficile colitis -C diff positive on 4-12, Received 4 days of flagyl without improvement, then started on PO vanc -Completed 3weeks of Oral Vanc, 1 week after IV Zosyn was stopped, stopped 5/5 -Diarrhea  resolved  Status post exploratory laparotomy with closure of perforated pyloric ulcer/abdominal abscess / small bowel resection w/ postop ileus and now with fistula -Pelvic abscess s/p percutaneous drain placement 4/8; now with leakage from anastomosis site -Received  Zosyn for 23 days. IV antibiotics discontinued by Surgery on 4/22, also Received 18 days of Vancomycin and fluconazole.  -Due to Fistula is back to NPO state, TPN.  -Fistula study done shows JP drain connection to the duodenal bulb 5-02. repeat CT abdomen with  3.0 x 3.5 x 5.7 cm collection in the pelvic cul-de-sac and extraluminal air alongside JP drain in anterior abd wall -Per C[facility]  Hypokalemia;  -replace in TNA  PSVT/concerns for pulmonary embolism given extremely high heart right side pressure -Has been evaluated by EP, no further episodes for 48h -Cardiology following peripherally- follow K+ and Mg  -Continue IV metoprolol, now off amiodarone;  -V-Q scan with intermediate probability for pulmonary embolism.  -CTA 4/27, negative for PE   Small PFO Has been evaluated by Cardiology  ACA CVA with Lt sided weakness -TEE ;normal LV function; no LAA thrombus -Started on ASA  per NEuro, now NPO -Will need ongoing long term rehab - PT/OT to cont to follow while inpatient Patient remains very weak and deconditioned .  ESRD new start HD this admit  Ongoing hemodialysis per Nephrology - perm-cath placed 3/26 d/t AVF problems Patient to have AV fistula reviewed at some point for permanent access (most likely in outpatient setting) .  Patient will need to be able to sit up for Outpatient dialysis. At this point still unable to sit up for HD -she was unable to  complete dialysis treatment 5-2 due to hypotension, SVT.   Chronic diastolic congestive heart failure w/ Severe RHF EF 60-65%, grade 1 diastolic dysfunction. Appears compensated.  Fluids/volume managed with hemodialysis.  Elevated PA pressures on 2-D echo; CTA negative for PE>   Anemia of critical illness and chronic disease Hgb presently stable s/p 2U PRBC 4/13 - follow  Stable, monitor Aranesp/iron per renal discretion   Thrombocytopenia; resolved.  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 On TPN now, CBGs in 120-160s Hbaic 6.5  Abnormal TSH -Free T4 WNL -T3 1.8 -most likely sick thyroid with ongoing infection and body stress; also due to amiodarone use. TSH in 7 range -will continue low dose synthroid given PSVT, now on IV synthroid   L Renal mass  -noted on US, Indeterminate 1.9 cm hypoechoic mass off the interpolar region of the left kidney -may potentially represent a cyst however solid mass is not excluded -needs  Outpatient FU of this  Obesity - Inadequate oral intake related to altered GI function as evidenced by limited intake, ongoing.  Body mass index is 31.04 kg/(m^2). -patient weight trending down -patient unable to tolerate diet and with positive leaking fistula  Code Status: FULL Family Communication: discussed with patient.  Disposition Plan: VERY POOR PROGNOSIS, UNREALISTIC EXPECTATIONS, GIVEN AKI/ESRD, ONGOING SURGICAL ISSUES, NPO, PALLIATIVE FOLLOWING, wants to continue aggressive course including FULL CODE  Consultants: Cardiology Nephrology PCCM Gen Surgery  Palliative care  Significant Events: 3/16 influenza A+ 3/18 R ACA stroke 3/19 MRI multifocal acute infarction of both hemispheres, large right distal ACA 3/22  TEE severe RAE, severely reduced RV function, severe TR, no LAA thrombus, small PFO on TEE 3/31 to OR ex lap for per pyloric ulcer and sm bowel resection 4/1 septic shock on pressors 4/3 pressors weaned off  4/4 restart pressors, CRRT 4/6 levo switched to neo 4/7 pelvic abscess on CT 4/8 IR placed pelvic drain 4/9 off pressors 4/11 long run of SVT. 4/23 VQ scan which demonstrated intermediate probability for PE 4/24 CT angiogram of her chest: Pending/unable to be done due to lack of access   Antibiotics: Zosyn >stopped on 4/22 (received a total of 23 days of treatment ) Vancomycin 4-16  DVT prophylaxis: SCDs  Objective: Blood pressure 82/48, pulse 120, temperature 98.5 F (36.9 C), temperature source Oral, resp. rate 24, height 5\' 6"  (1.676 m), weight 87.2 kg (192 lb 3.9 oz), SpO2 93 %.  Intake/Output Summary (Last 24 hours) at 08/07/14 1211 Last data filed at 08/07/14 1018  Gross per 24 hour  Intake      0 ml  Output   1217 ml  Net  -1217 ml   Exam: General: AAOx3, chronically ill appearing Lungs: poor air movement, scattered ronchi Cardiovascular: Regular rate, no murmurs and no gallops   Abdomen: Abdominal wound dressing, clean and dry; abdomen slightly distended, drain intact and with /green drainage; mild tenderness as expected;  Extremities: no  edema   Data Reviewed: Basic Metabolic Panel:  Recent Labs Lab 08/01/14 0538  08/03/14 0500 08/04/14 0754 08/05/14 0400 08/06/14 0410 08/06/14 1820 08/07/14 0420 08/07/14 0805  NA 137  < > 133* 131* 130*  130* 135  --  129*  --   K 2.8*  < > 3.4* 3.4* 3.4*  3.3* 3.3* 3.5 3.1*  --   CL 95*  < > 92* 91* 88*  88* 93*  --  84*  --   CO2 30  < > 27 28 25  26 30   --  29  --   GLUCOSE 183*  < > 138* 137* 156*  156* 134*  --  134*  --   BUN 23  < > 59* 53* 81*  80* 41*  --  79*  --   CREATININE 4.79*  < > 6.38* 5.73* 7.27*  7.28* 4.18*  --  6.05*  --   CALCIUM 8.8  < > 8.4* 8.4* 8.4*  8.4* 8.6*  --  8.7*  --     MG 1.9  --  1.4* 1.4* 1.8 2.0  --   --   --   PHOS 1.2*  < > 1.7* 2.0* 4.4  4.4 1.8*  --   --  2.2*  < > = values in this interval not displayed.  Liver Function Tests:  Recent Labs Lab 08/02/14 1102 08/03/14 0500 08/04/14 0754 08/05/14 0400 08/06/14 0410  AST  --  29  --   --  31  ALT  --  17  --   --  18  ALKPHOS  --  148*  --   --  160*  BILITOT  --  1.1  --   --  1.0  PROT  --  7.3  --   --  7.3  ALBUMIN 2.1* 2.0* 2.1* 2.0* 1.9*   CBC:  Recent Labs Lab 08/03/14 0500 08/03/14 1630 08/04/14 0958 08/05/14 0400 08/07/14 0420  WBC 11.2* 12.4* 14.0* 16.3* 15.5*  NEUTROABS 7.8*  --   --   --   --   HGB 11.3* 10.7* 9.6* 9.3* 9.3*  HCT 35.0* 33.1* 30.6* 28.9* 28.8*  MCV 90.7 89.9 90.5 87.8 86.7  PLT 243 121* 255 285 312    CBG:  Recent Labs Lab 08/06/14 0533 08/06/14 1210 08/06/14 1850 08/06/14 2325 08/07/14 0547  GLUCAP 154* 164* 116* 143* 153*    Scheduled Meds:  Scheduled Meds: . alteplase  5 mg Intracatheter STAT  . antiseptic oral rinse  7 mL Mouth Rinse q12n4p  . budesonide (PULMICORT) nebulizer solution  0.25 mg Nebulization BID  . chlorhexidine  15 mL Mouth Rinse BID  . darbepoetin (ARANESP) injection - DIALYSIS  100 mcg Intravenous Q Mon-HD  . insulin aspart  0-9 Units Subcutaneous 4 times per day  . levothyroxine  12.5 mcg Intravenous Daily  . metoprolol  2.5 mg Intravenous 3 times per day  . potassium chloride  10 mEq Intravenous Q1 Hr x 3  . sodium chloride  10-40 mL Intracatheter Q12H  . sodium phosphate  Dextrose 5% IVPB  20 mmol Intravenous Once  . vancomycin  125 mg Oral 4 times per day    Time spent on care of this patient: 35 minutes   Zannie CoveJOSEPH,Shaune Malacara , MD  [phone] [facility] Office  [phone]   If 7PM-7AM, please contact night-coverage [email or web address] Password [email or web address] 08/07/2014, 12:11 PM   LOS: 51 days

## 2014-08-07 NOTE — Progress Notes (Signed)
Pt rhythm appears afib/SR toggle, rate 117 ('one teens') paged attending. Will continue to monitor.

## 2014-08-07 NOTE — Progress Notes (Signed)
Patient is refusing CPAP again tonight. CPAP machine is in the room. RT made patient aware that if she changed her mind to call and we will place her on the CPAP.

## 2014-08-07 NOTE — Progress Notes (Signed)
HR back in 130s

## 2014-08-08 LAB — COMPREHENSIVE METABOLIC PANEL WITH GFR
ALT: 16 U/L (ref 14–54)
AST: 27 U/L (ref 15–41)
Albumin: 1.7 g/dL — ABNORMAL LOW (ref 3.5–5.0)
Alkaline Phosphatase: 161 U/L — ABNORMAL HIGH (ref 38–126)
Anion gap: 13 (ref 5–15)
BUN: 83 mg/dL — ABNORMAL HIGH (ref 6–20)
CO2: 31 mmol/L (ref 22–32)
Calcium: 8.2 mg/dL — ABNORMAL LOW (ref 8.9–10.3)
Chloride: 86 mmol/L — ABNORMAL LOW (ref 101–111)
Creatinine, Ser: 6.06 mg/dL — ABNORMAL HIGH (ref 0.44–1.00)
GFR calc Af Amer: 8 mL/min — ABNORMAL LOW
GFR calc non Af Amer: 7 mL/min — ABNORMAL LOW
Glucose, Bld: 136 mg/dL — ABNORMAL HIGH (ref 70–99)
Potassium: 3.1 mmol/L — ABNORMAL LOW (ref 3.5–5.1)
Sodium: 130 mmol/L — ABNORMAL LOW (ref 135–145)
Total Bilirubin: 1.1 mg/dL (ref 0.3–1.2)
Total Protein: 6.5 g/dL (ref 6.5–8.1)

## 2014-08-08 LAB — CBC
HCT: 25.4 % — ABNORMAL LOW (ref 36.0–46.0)
HEMATOCRIT: 25.2 % — AB (ref 36.0–46.0)
Hemoglobin: 8.1 g/dL — ABNORMAL LOW (ref 12.0–15.0)
Hemoglobin: 7.9 g/dL — ABNORMAL LOW (ref 12.0–15.0)
MCH: 27.9 pg (ref 26.0–34.0)
MCH: 27.8 pg (ref 26.0–34.0)
MCHC: 31.9 g/dL (ref 30.0–36.0)
MCHC: 31.3 g/dL (ref 30.0–36.0)
MCV: 87.6 fL (ref 78.0–100.0)
MCV: 88.7 fL (ref 78.0–100.0)
Platelets: 308 10*3/uL (ref 150–400)
Platelets: 314 10*3/uL (ref 150–400)
RBC: 2.9 MIL/uL — ABNORMAL LOW (ref 3.87–5.11)
RBC: 2.84 MIL/uL — ABNORMAL LOW (ref 3.87–5.11)
RDW: 21.4 % — ABNORMAL HIGH (ref 11.5–15.5)
RDW: 20.9 % — ABNORMAL HIGH (ref 11.5–15.5)
WBC: 13.8 10*3/uL — ABNORMAL HIGH (ref 4.0–10.5)
WBC: 13.9 10*3/uL — ABNORMAL HIGH (ref 4.0–10.5)

## 2014-08-08 LAB — GLUCOSE, CAPILLARY
GLUCOSE-CAPILLARY: 148 mg/dL — AB (ref 70–99)
GLUCOSE-CAPILLARY: 129 mg/dL — AB (ref 70–99)
Glucose-Capillary: 135 mg/dL — ABNORMAL HIGH (ref 70–99)
Glucose-Capillary: 151 mg/dL — ABNORMAL HIGH (ref 70–99)

## 2014-08-08 LAB — PHOSPHORUS: Phosphorus: 3.6 mg/dL (ref 2.5–4.6)

## 2014-08-08 MED ORDER — FAT EMULSION 20 % IV EMUL
240.0000 mL | INTRAVENOUS | Status: AC
  Administered 2014-08-08: 240 mL via INTRAVENOUS
  Filled 2014-08-08: qty 250

## 2014-08-08 MED ORDER — LORAZEPAM 2 MG/ML IJ SOLN
0.5000 mg | Freq: Once | INTRAMUSCULAR | Status: AC
  Administered 2014-08-08: 1 mg via INTRAVENOUS
  Filled 2014-08-08: qty 1

## 2014-08-08 MED ORDER — TRACE MINERALS CR-CU-F-FE-I-MN-MO-SE-ZN IV SOLN: INTRAVENOUS | Status: DC

## 2014-08-08 MED ORDER — FAT EMULSION 20 % IV EMUL: 240.0000 mL | INTRAVENOUS | Status: DC

## 2014-08-08 MED ORDER — LORAZEPAM 2 MG/ML IJ SOLN: 0.5000 mg | Freq: Once | INTRAMUSCULAR | Status: DC

## 2014-08-08 MED ORDER — TRACE MINERALS CR-CU-F-FE-I-MN-MO-SE-ZN IV SOLN
INTRAVENOUS | Status: AC
  Administered 2014-08-08: 17:00:00 via INTRAVENOUS
  Filled 2014-08-08: qty 2400

## 2014-08-08 NOTE — Progress Notes (Signed)
38 Days Post-Op  Subjective: No change  Objective: Vital signs in last 24 hours: Temp:  [97.6 F (36.4 C)-98.8 F (37.1 C)] 97.8 F (36.6 C) (05/07 0847) Pulse Rate:  [115-120] 119 (05/07 0349) Resp:  [18-26] 26 (05/07 0910) BP: (82-108)/(44-75) 94/44 mmHg (05/07 0910) SpO2:  [93 %-97 %] 94 % (05/07 0857) Weight:  [89 kg (196 lb 3.4 oz)] 89 kg (196 lb 3.4 oz) (05/07 0349) Last BM Date: 07/31/14  Intake/Output from previous day: 05/06 0701 - 05/07 0700 In: 5855.7 [I.V.:730; IV Piggyback:350; TPN:4775.7] Out: -33 [Drains:850] Intake/Output this shift: Total I/O In: -  Out: 725 [Drains:725]  Incision/Wound:open clean  Drain with bilious output no peritonitis  Lab Results:   Recent Labs  08/07/14 0420 08/08/14 0530  WBC 15.5* 13.8*  HGB 9.3* 8.1*  HCT 28.8* 25.4*  PLT 312 308   BMET  Recent Labs  08/07/14 0420 08/08/14 0530  NA 129* 130*  K 3.1* 3.1*  CL 84* 86*  CO2 29 31  GLUCOSE 134* 136*  BUN 79* 83*  CREATININE 6.05* 6.06*  CALCIUM 8.7* 8.2*   PT/INR No results for input(s): LABPROT, INR in the last 72 hours. ABG No results for input(s): PHART, HCO3 in the last 72 hours.  Invalid input(s): PCO2, PO2  Studies/Results: No results found.  Anti-infectives: Anti-infectives    Start     Dose/Rate Route Frequency Ordered Stop   08/04/14 1200  vancomycin (VANCOCIN) 50 mg/mL oral solution 125 mg     125 mg Oral 4 times per day 08/04/14 0851     07/18/14 1800  vancomycin (VANCOCIN) 50 mg/mL oral solution 125 mg  Status:  Discontinued     125 mg Oral 4 times per day 07/18/14 1455 08/04/14 0845   07/17/14 1830  vancomycin (VANCOCIN) IVPB 750 mg/150 ml premix     750 mg 150 mL/hr over 60 Minutes Intravenous  Once 07/17/14 1818 07/18/14 0024   07/15/14 1400  metroNIDAZOLE (FLAGYL) tablet 500 mg  Status:  Discontinued     500 mg Oral 3 times per day 07/15/14 1202 07/18/14 1533   07/13/14 2200  piperacillin-tazobactam (ZOSYN) IVPB 2.25 g  Status:   Discontinued     2.25 g 100 mL/hr over 30 Minutes Intravenous 3 times per day 07/13/14 1318 07/24/14 1138   07/13/14 2000  fluconazole (DIFLUCAN) IVPB 200 mg  Status:  Discontinued     200 mg 100 mL/hr over 60 Minutes Intravenous Every 24 hours 07/13/14 1318 07/19/14 1027   07/07/14 1200  vancomycin (VANCOCIN) IVPB 1000 mg/200 mL premix  Status:  Discontinued     1,000 mg 200 mL/hr over 60 Minutes Intravenous Every 24 hours 07/07/14 0937 07/14/14 1712   07/06/14 2000  fluconazole (DIFLUCAN) IVPB 400 mg  Status:  Discontinued     400 mg 100 mL/hr over 120 Minutes Intravenous Every 24 hours 07/06/14 1507 07/13/14 1318   07/06/14 1800  piperacillin-tazobactam (ZOSYN) IVPB 2.25 g  Status:  Discontinued     2.25 g 100 mL/hr over 30 Minutes Intravenous 4 times per day 07/06/14 1505 07/13/14 1318   07/05/14 2000  fluconazole (DIFLUCAN) IVPB 200 mg  Status:  Discontinued     200 mg 100 mL/hr over 60 Minutes Intravenous Every 24 hours 07/05/14 0757 07/06/14 1507   07/05/14 1400  piperacillin-tazobactam (ZOSYN) IVPB 2.25 g  Status:  Discontinued     2.25 g 100 mL/hr over 30 Minutes Intravenous 3 times per day 07/05/14 0749 07/06/14 1505   07/02/14  1800  vancomycin (VANCOCIN) IVPB 1000 mg/200 mL premix  Status:  Discontinued     1,000 mg 200 mL/hr over 60 Minutes Intravenous Every 24 hours 07/01/14 1858 07/05/14 0801   07/01/14 2200  piperacillin-tazobactam (ZOSYN) IVPB 3.375 g  Status:  Discontinued     3.375 g 100 mL/hr over 30 Minutes Intravenous 4 times per day 07/01/14 1858 07/05/14 0749   07/01/14 2000  fluconazole (DIFLUCAN) IVPB 400 mg  Status:  Discontinued     400 mg 100 mL/hr over 120 Minutes Intravenous Every 24 hours 07/01/14 1858 07/05/14 0757   07/01/14 1400  fluconazole (DIFLUCAN) IVPB 200 mg  Status:  Discontinued     200 mg 100 mL/hr over 60 Minutes Intravenous Every 24 hours 07/01/14 1330 07/01/14 1853   07/01/14 1000  piperacillin-tazobactam (ZOSYN) IVPB 2.25 g  Status:   Discontinued     2.25 g 100 mL/hr over 30 Minutes Intravenous Every 8 hours 07/01/14 0952 07/01/14 1853   07/01/14 1000  vancomycin (VANCOCIN) 500 mg in sodium chloride 0.9 % 100 mL IVPB     500 mg 100 mL/hr over 60 Minutes Intravenous  Once 07/01/14 0952 07/01/14 1126   06/30/14 1200  vancomycin (VANCOCIN) IVPB 1000 mg/200 mL premix     1,000 mg 200 mL/hr over 60 Minutes Intravenous  Once 06/30/14 0944 06/30/14 1403   06/30/14 1200  ceFEPIme (MAXIPIME) 2 g in dextrose 5 % 50 mL IVPB     2 g 100 mL/hr over 30 Minutes Intravenous  Once 06/30/14 0944 06/30/14 1425   06/30/14 0100  vancomycin (VANCOCIN) 2,000 mg in sodium chloride 0.9 % 500 mL IVPB     2,000 mg 250 mL/hr over 120 Minutes Intravenous  Once 06/30/14 0048 06/30/14 0525   06/30/14 0100  ceFEPIme (MAXIPIME) 2 g in dextrose 5 % 50 mL IVPB     2 g 100 mL/hr over 30 Minutes Intravenous  Once 06/30/14 0048 06/30/14 0322   06/27/14 0600  cefUROXime (ZINACEF) 1.5 g in dextrose 5 % 50 mL IVPB     1.5 g 100 mL/hr over 30 Minutes Intravenous On call to O.R. 06/26/14 1019 06/27/14 0630   06/19/14 1800  oseltamivir (TAMIFLU) capsule 30 mg     30 mg Oral Every M-W-F (1800) 06/19/14 1003 06/22/14 1841   06/18/14 1600  oseltamivir (TAMIFLU) capsule 30 mg  Status:  Discontinued     30 mg Oral Daily 06/18/14 1538 06/19/14 1003      Assessment/Plan: s/p Procedure(s): EXPLORATORY LAPAROTOMY WITH CLOSURE OF PERFORATED PYLORIC ULCER (N/A) SMALL BOWEL RESECTION (N/A) Patient Active Problem List   Diagnosis Date Noted  . SVT (supraventricular tachycardia)   . Right heart failure   . ESRD (end stage renal disease)   . Abdominal pain   . Palliative care encounter   . 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 Fistula   . History of intermediate V/Q scan   . Other emphysema   . Intra-abdominal abscess   . Abnormal TSH   . Enteritis due to Clostridium difficile 07/19/2014  . Abdominal abscess   . Pelvic fluid collection   .  Perforated gastric ulcer 07/01/2014  . SOB (shortness of breath)   . PJRT (permanent junctional reciprocating tachycardia)   . Paroxysmal atrial tachycardia   . PSVT (paroxysmal supraventricular tachycardia)   . Stroke   . Hyperlipidemia   . 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 Hypertension 06/20/2014  . ESRD needing dialysis 06/20/2014  . Obesity (BMI 30-39.9) 06/20/2014  .  Depression 06/20/2014  . Left renal mass 06/20/2014  . Chronic diastolic heart failure 06/20/2014  . H1N1 influenza 06/20/2014  . Tobacco use disorder 06/20/2014  . ARF (acute renal failure)   . Renal failure (ARF), acute on chronic   . Acute respiratory failure with hypoxia 06/17/2014  . End stage renal disease 01/21/2014   No significant change at this point   LOS: 52 days    Avaiyah Strubel A. 08/08/2014

## 2014-08-08 NOTE — Progress Notes (Signed)
SUBJECTIVE:  Denies CP or dyspnea; abdomen "sore"   PHYSICAL EXAM Filed Vitals:   08/08/14 0520 08/08/14 0847 08/08/14 0857 08/08/14 0910  BP: 103/64 84/52  94/44  Pulse:      Temp:  97.8 F (36.6 C)    TempSrc:  Oral    Resp: 23 24  26   Height:      Weight:      SpO2:   94%    General:  WD/No distress HEENT: normal Neck: supple Chest: CTA anteriorly Heart:  Tachy Abd: s/p abdominal surgery; drain in place Extremities:  No edema  LABS:  Results for orders placed or performed during the hospital encounter of 06/17/14 (from the past 24 hour(s))  Glucose, capillary     Status: Abnormal   Collection Time: 08/07/14  1:06 PM  Result Value Ref Range   Glucose-Capillary 144 (H) 70 - 99 mg/dL  Glucose, capillary     Status: Abnormal   Collection Time: 08/07/14  6:39 PM  Result Value Ref Range   Glucose-Capillary 132 (H) 70 - 99 mg/dL   Comment 1 Notify RN    Comment 2 Document in Chart   Glucose, capillary     Status: Abnormal   Collection Time: 08/07/14 11:39 PM  Result Value Ref Range   Glucose-Capillary 135 (H) 70 - 99 mg/dL   Comment 1 Notify RN   Glucose, capillary     Status: Abnormal   Collection Time: 08/08/14  5:17 AM  Result Value Ref Range   Glucose-Capillary 148 (H) 70 - 99 mg/dL   Comment 1 Notify RN   CBC     Status: Abnormal   Collection Time: 08/08/14  5:30 AM  Result Value Ref Range   WBC 13.8 (H) 4.0 - 10.5 K/uL   RBC 2.90 (L) 3.87 - 5.11 MIL/uL   Hemoglobin 8.1 (L) 12.0 - 15.0 g/dL   HCT 19.125.4 (L) 47.836.0 - 29.546.0 %   MCV 87.6 78.0 - 100.0 fL   MCH 27.9 26.0 - 34.0 pg   MCHC 31.9 30.0 - 36.0 g/dL   RDW 62.121.4 (H) 30.811.5 - 65.715.5 %   Platelets 308 150 - 400 K/uL  Comprehensive metabolic panel     Status: Abnormal   Collection Time: 08/08/14  5:30 AM  Result Value Ref Range   Sodium 130 (L) 135 - 145 mmol/L   Potassium 3.1 (L) 3.5 - 5.1 mmol/L   Chloride 86 (L) 101 - 111 mmol/L   CO2 31 22 - 32 mmol/L   Glucose, Bld 136 (H) 70 - 99 mg/dL   BUN 83  (H) 6 - 20 mg/dL   Creatinine, Ser 8.466.06 (H) 0.44 - 1.00 mg/dL   Calcium 8.2 (L) 8.9 - 10.3 mg/dL   Total Protein 6.5 6.5 - 8.1 g/dL   Albumin 1.7 (L) 3.5 - 5.0 g/dL   AST 27 15 - 41 U/L   ALT 16 14 - 54 U/L   Alkaline Phosphatase 161 (H) 38 - 126 U/L   Total Bilirubin 1.1 0.3 - 1.2 mg/dL   GFR calc non Af Amer 7 (L) >60 mL/min   GFR calc Af Amer 8 (L) >60 mL/min   Anion gap 13 5 - 15  Phosphorus     Status: None   Collection Time: 08/08/14  5:30 AM  Result Value Ref Range   Phosphorus 3.6 2.5 - 4.6 mg/dL    Intake/Output Summary (Last 24 hours) at 08/08/14 0924 Last data filed at 08/08/14 0900  Gross per 24 hour  Intake 5805.67 ml  Output    692 ml  Net 5113.67 ml      ASSESSMENT AND PLAN:  1 SVT:  Patient had brief SVT earlier today. She is now in sinus with PACs. Would continue IV metoprolol. If SVT becomes more frequent we could resume amiodarone. Would not do that at this point. Other issues per primary care  Stephanie MillersBrian Mckayla Mulcahey 08/08/2014 9:24 AM

## 2014-08-08 NOTE — Progress Notes (Signed)
Progress Note [facility].   Zareah C Risk ZOX:[id number] DOB: Nailah 19, 1957 DOA: 06/17/2014 PCP: [name], MD  Admit HPI / Brief Narrative: 59 yo female smoker with hx HTN, ESRD on HD (just began this admit), COPD, failed L AV fistula, initially admitted 3/16 with flu and acute hypoxic resp failure. Ultimately tx to [facility] due to need for HD. 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   Patient on 3/31 was taking to the OR for ex lap for per pyloric ulcer and sm bowel resection. Patient develops septic shock on 4-1, she was started on pressors and wean off pressors on 4-3. Subsequently she was diagnosed with pelvic abscess by CT scan perform on 4-7. She underwent pelvic drain placement by IR. Patient also develops C. diff diagnosed 4-12.  C diff was not responding flagyl, and she was started on vancomycin 4-16. She also develops  run of SVT. Cardiology has been helping with management. Course complicated with high RV pressure and concerns for PE; . CT angio negative for PE. Also with fistula formation between anastomosis and blake drain, making her back to NPO state and in need of TPN. Family want everything to be done. Palliative care consulted and following. Currently Full code. Patient on 5-2 develops hypoxemia, hypotension and SVT during dialysis treatment. Dialysis was stop. She received IV bolus. She was started on BIPAP. She was transfer to Step down unit. CCM and cardiology re consulted.Her Hr decrease, BP improved. She was taken off BIPAP. Now stable, on TNA, off Abx  HPI/Subjective: Asking about food again, mild abd pain  Assessment/Plan: Acute hypoxic Respiratory Failure:  -2nd to H1N1, hypervolemia, HCAP >> resolved.  -also has COPD and PAH -improving, now weaned off O2 -Nebulizer PRN and CPAP QHS   Septic shock  -due to perforated prepyloric ulcer, requiring pressors -resolved  C. difficile colitis -C diff positive on 4-12, Received 4 days of flagyl  without improvement, then started on PO vanc -Completed 3weeks of Oral Vanc, 1 week after IV Zosyn was stopped, stopped 5/5 -Diarrhea  resolved  Status post exploratory laparotomy with closure of perforated pyloric ulcer/abdominal abscess / small bowel resection w/ postop ileus and now with fistula -Pelvic abscess s/p percutaneous drain placement 4/8; now with leakage from anastomosis site -Received  Zosyn for 23 days. IV antibiotics discontinued by Surgery on 4/22, also Received 18 days of Vancomycin and fluconazole.  -Due to Fistula is back to NPO state, TPN.  -Fistula study done shows JP drain connection to the duodenal bulb 5-02. repeat CT abdomen with  3.0 x 3.5 x 5.7 cm collection in the pelvic cul-de-sac and extraluminal air alongside JP drain in anterior abd wall -Per [facility], plan of repeat CT next week  Hypokalemia;  -replace in TNA  PSVT/concerns for pulmonary embolism given extremely high heart right side pressure -Has been evaluated by EP, no further episodes for 48h -Cardiology following peripherally- follow K+ and Mg  -Continue IV metoprolol, now off amiodarone;  -V-Q scan with intermediate probability for pulmonary embolism.  -CTA 4/27, negative for PE   Small PFO Has been evaluated by Cardiology  ACA CVA with Lt sided weakness -TEE ;normal LV function; no LAA thrombus -Started on ASA  per NEuro, now NPO -Will need ongoing long term rehab - PT/OT to cont to follow while inpatient Patient remains very weak and deconditioned .   ESRD new start HD this admit  AKi due to sepsis, shock Ongoing hemodialysis per Nephrology -  perm-cath placed 3/26 d/t AVF problems Patient to have AV fistula reviewed at some point for permanent access (most likely in outpatient setting) .  Patient will need to be able to sit up for Outpatient dialysis. At this point still unable to sit up for HD -she was unable to complete dialysis treatment 5-2 due to hypotension, SVT.   Chronic  diastolic congestive heart failure w/ Severe RHF EF 60-65%, grade 1 diastolic dysfunction. Appears compensated.  Fluids/volume managed with hemodialysis.  Elevated PA pressures on 2-D echo; CTA negative for PE>   Anemia of critical illness and chronic disease Hgb presently stable s/p 2U PRBC 4/13 - follow  Stable, monitor Aranesp/iron per renal discretion   Thrombocytopenia; resolved.  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 On TPN now, CBGs in 120-160s Hbaic 6.5  Abnormal TSH -Free T4 WNL, T3 1.8 -most likely sick thyroid with ongoing infection and body stress; also due to amiodarone use. TSH in 7 range -continue low dose synthroid given PSVT, now on IV synthroid   L Renal mass  -noted on US, Indeterminate 1.9 cm hypoechoic mass off the interpolar region of the left kidney -may potentially represent a cyst however solid mass is not excluded -needs  Outpatient FU of this  Obesity - Inadequate oral intake related to altered GI function as evidenced by limited intake, ongoing.  Body mass index is 31.68 kg/(m^2). -patient weight trending down -patient unable to tolerate diet and with leaking fistula  Code Status: FULL Family Communication: discussed with patient.  Disposition Plan: VERY POOR PROGNOSIS, UNREALISTIC EXPECTATIONS, GIVEN AKI/ESRD, ONGOING SURGICAL ISSUES, NPO, PALLIATIVE FOLLOWING, wants to continue aggressive course including FULL CODE  Consultants: Cardiology Nephrology PCCM Gen Surgery  Palliative care  Significant Events: 3/16 influenza A+ 3/18 R ACA stroke 3/19 MRI multifocal acute infarction of both hemispheres, large right distal ACA 3/22 TEE severe RAE, severely reduced RV function, severe TR, no LAA thrombus, small PFO  on TEE 3/31 to OR ex lap for per pyloric ulcer and sm bowel resection 4/1 septic shock on pressors 4/3 pressors weaned off  4/4 restart pressors, CRRT 4/6 levo switched to neo 4/7 pelvic abscess on CT 4/8 IR placed pelvic drain 4/9 off pressors 4/11 long run of SVT. 4/23 VQ scan which demonstrated intermediate probability for PE 4/24 CT angiogram of her chest: Pending/unable to be done due to lack of access   Antibiotics: Zosyn >stopped on 4/22 (received a total of 23 days of treatment ) Vancomycin 4-16  DVT prophylaxis: SCDs  Objective: Blood pressure 84/48, pulse 117, temperature 97.7 F (36.5 C), temperature source Oral, resp. rate 20, height 5\' 6"  (1.676 m), weight 89 kg (196 lb 3.4 oz), SpO2 94 %.  Intake/Output Summary (Last 24 hours) at 08/08/14 1310 Last data filed at 08/08/14 1100  Gross per 24 hour  Intake 5185.67 ml  Output   1875 ml  Net 3310.67 ml   Exam: General: AAOx3, chronically ill appearing Lungs: poor air movement Cardiovascular: Regular rate, no murmurs and no gallops   Abdomen: Abdominal wound dressing, clean and dry; abdomen slightly distended, drain intact and with /green drainage; mild tenderness   Extremities: no edema   Data Reviewed: Basic Metabolic Panel:  Recent Labs Lab 08/03/14 0500 08/04/14 0754 08/05/14 0400 08/06/14 0410 08/06/14 1820 08/07/14  0420 08/07/14 0805 08/08/14 0530  NA 133* 131* 130*  130* 135  --  129*  --  130*  K 3.4* 3.4* 3.4*  3.3* 3.3* 3.5 3.1*  --  3.1*  CL 92* 91* 88*  88* 93*  --  84*  --  86*  CO2 27 28 25  26 30   --  29  --  31  GLUCOSE 138* 137* 156*  156* 134*  --  134*  --  136*  BUN 59* 53* 81*  80* 41*  --  79*  --  83*  CREATININE 6.38* 5.73* 7.27*  7.28* 4.18*  --  6.05*  --  6.06*  CALCIUM 8.4* 8.4* 8.4*  8.4* 8.6*  --  8.7*  --  8.2*  MG 1.4* 1.4* 1.8 2.0  --   --   --   --   PHOS 1.7* 2.0* 4.4  4.4 1.8*  --   --  2.2* 3.6    Liver Function Tests:  Recent Labs Lab  08/03/14 0500 08/04/14 0754 08/05/14 0400 08/06/14 0410 08/08/14 0530  AST 29  --   --  31 27  ALT 17  --   --  18 16  ALKPHOS 148*  --   --  160* 161*  BILITOT 1.1  --   --  1.0 1.1  PROT 7.3  --   --  7.3 6.5  ALBUMIN 2.0* 2.1* 2.0* 1.9* 1.7*   CBC:  Recent Labs Lab 08/03/14 0500 08/03/14 1630 08/04/14 0958 08/05/14 0400 08/07/14 0420 08/08/14 0530  WBC 11.2* 12.4* 14.0* 16.3* 15.5* 13.8*  NEUTROABS 7.8*  --   --   --   --   --   HGB 11.3* 10.7* 9.6* 9.3* 9.3* 8.1*  HCT 35.0* 33.1* 30.6* 28.9* 28.8* 25.4*  MCV 90.7 89.9 90.5 87.8 86.7 87.6  PLT 243 121* 255 285 312 308    CBG:  Recent Labs Lab 08/07/14 0547 08/07/14 1306 08/07/14 1839 08/07/14 2339 08/08/14 0517  GLUCAP 153* 144* 132* 135* 148*    Scheduled Meds:  Scheduled Meds: . antiseptic oral rinse  7 mL Mouth Rinse q12n4p  . budesonide (PULMICORT) nebulizer solution  0.25 mg Nebulization BID  . chlorhexidine  15 mL Mouth Rinse BID  . darbepoetin (ARANESP) injection - DIALYSIS  100 mcg Intravenous Q Mon-HD  . insulin aspart  0-9 Units Subcutaneous 4 times per day  . levothyroxine  12.5 mcg Intravenous Daily  . metoprolol  2.5 mg Intravenous 3 times per day  . sodium chloride  10-40 mL Intracatheter Q12H    Time spent on care of this patient: 35 minutes   Zannie CoveJOSEPH,Treveon Bourcier , MD  [phone] [facility] Office  [phone]   If 7PM-7AM, please contact night-coverage [email or web address] Password [email or web address] 08/08/2014, 1:10 PM   LOS: 52 days

## 2014-08-08 NOTE — Progress Notes (Signed)
   08/08/14 2326  Psychosocial  Patient Behaviors Agitated;Irritable;Withdrawn  Incision (Closed) 07/01/14 Abdomen Other (Comment)  Date First Assessed/Time First Assessed: 07/01/14 1711   Location: Abdomen  Location Orientation: Other (Comment)  Dressing Type Moist to dry  Dressing Changed  Dressing Change Frequency Twice a day  Site / Wound Assessment Black;Yellow;Pink  Margins Unattached edges (unapproximated)  Closure None  Drainage Amount Minimal  Drainage Description Green;Purulent;Serous  Treatment Cleansed  pt Obs with fingers in wound, stated was itching, pt educated leave dressing intact, nursing will cont to monitor

## 2014-08-08 NOTE — Progress Notes (Signed)
Drainage out of pt's jp changed from green/bile to bright red blood. MD notified. Surgery notified. Stat CBC ordered, will continue to monitor.

## 2014-08-08 NOTE — Progress Notes (Signed)
Spoke to MD Tsuei concerning bloody output from JP drain. MD would like JP taken off wall suction and jp charged. Will continue to monitor.

## 2014-08-08 NOTE — Progress Notes (Signed)
[facility] Progress Note   Subjective: no complaints  Filed Vitals:   08/08/14 0520 08/08/14 0847 08/08/14 0857 08/08/14 0910  BP: 103/64 84/52  94/44  Pulse:      Temp:  97.8 F (36.6 C)    TempSrc:  Oral    Resp: 23 24  26   Height:      Weight:      SpO2:   94%    Exam: Lying flat, alert No jvd Chest clear bilat Abd RUQ drain connected to wall suction, open abd wound w packing Ext no edema Access is R IJ cath and left arm AVF +bruit Neuro is ox 3, L arm stronger than a few weeks ago  HD: new start, MWF schedule in hospital        Assessment: 1. ESRD new start MWF inpatient, using cath as AVF poor fxn 2. CVA w left hemiparesis 3. S/P exlap for perf pyloric ulcer/ SBR for ischemic SB 3/30 - now w duodenal fistula large output 4. Abd wound - open, packing per surg 5. Nutrition - TNA for now 6. Volume - large drain outputs have balanced vol load from TNA 7. Pelvic fluid collection - s/p IR drain, out now 8. 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 Anemia cont darbe 11. 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 DM2 14. 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 Hypotension on midodrine 16. Hypokalemia - due to large GI losses, pharm following. 4K bath w HD. 17. Hypomagnesemia - better  Plan - HD  Monday     Vinson Moselleob Takiya Belmares MD  pager [phone]    cell [phone]  08/08/2014, 11:09 AM     Recent Labs Lab 08/06/14 0410 08/06/14 1820 08/07/14 0420 08/07/14 0805 08/08/14 0530  NA 135  --  129*  --  130*  K 3.3* 3.5 3.1*  --  3.1*  CL 93*  --  84*  --  86*  CO2 30  --  29  --  31  GLUCOSE 134*  --  134*  --  136*  BUN 41*  --  79*  --  83*  CREATININE 4.18*  --  6.05*  --  6.06*  CALCIUM 8.6*  --  8.7*  --  8.2*  PHOS 1.8*  --   --  2.2* 3.6    Recent Labs Lab 08/03/14 0500  08/05/14 0400 08/06/14 0410 08/08/14 0530  AST 29  --   --  31 27  ALT 17  --   --  18 16  ALKPHOS 148*  --   --  160* 161*  BILITOT  1.1  --   --  1.0 1.1  PROT 7.3  --   --  7.3 6.5  ALBUMIN 2.0*  < > 2.0* 1.9* 1.7*  < > = values in this interval not displayed.  Recent Labs Lab 08/03/14 0500  08/05/14 0400 08/07/14 0420 08/08/14 0530  WBC 11.2*  < > 16.3* 15.5* 13.8*  NEUTROABS 7.8*  --   --   --   --   HGB 11.3*  < > 9.3* 9.3* 8.1*  HCT 35.0*  < > 28.9* 28.8* 25.4*  MCV 90.7  < > 87.8 86.7 87.6  PLT 243  < > 285 312 308  < > = values in this interval not displayed. Marland Kitchen. antiseptic oral rinse  7 mL Mouth Rinse q12n4p  . budesonide (  PULMICORT) nebulizer solution  0.25 mg Nebulization BID  . chlorhexidine  15 mL Mouth Rinse BID  . darbepoetin (ARANESP) injection - DIALYSIS  100 mcg Intravenous Q Mon-HD  . insulin aspart  0-9 Units Subcutaneous 4 times per day  . levothyroxine  12.5 mcg Intravenous Daily  . metoprolol  2.5 mg Intravenous 3 times per day  . sodium chloride  10-40 mL Intracatheter Q12H  . vancomycin  125 mg Oral 4 times per day   . Marland Kitchen.TPN (CLINIMIX-E) Adult     And  . fat emulsion    . sodium chloride 10 mL/hr at 08/04/14 2300  . TPN (CLINIMIX) Adult without lytes 100 mL/hr at 08/07/14 1736   And  . fat emulsion 240 mL (08/07/14 1736)   bisacodyl, diphenhydrAMINE, fentaNYL (SUBLIMAZE) injection, levalbuterol, ondansetron (ZOFRAN) IV, sodium chloride

## 2014-08-08 NOTE — Progress Notes (Signed)
PARENTERAL NUTRITION CONSULT NOTE - FOLLOW UP  Pharmacy Consult for TPN Indication:  High output Duodenal Fistula  No Known Allergies  Patient Measurements: Height: 5\' 6"  (167.6 cm) Weight: 196 lb 3.4 oz (89 kg) IBW/kg (Calculated) : 59.3 Adjusted Body Weight: 71.2kg  Vital Signs: Temp: 97.9 F (36.6 C) (05/07 0349) Temp Source: Oral (05/07 0349) BP: 103/64 mmHg (05/07 0520) Pulse Rate: 119 (05/07 0349) Intake/Output from previous day: 05/06 0701 - 05/07 0700 In: 5855.7 [I.V.:730; IV Piggyback:350; TPN:4775.7] Out: -33 [Drains:850]  Labs:  Recent Labs  08/07/14 0420 08/08/14 0530  WBC 15.5* 13.8*  HGB 9.3* 8.1*  HCT 28.8* 25.4*  PLT 312 308    Recent Labs  08/06/14 0410 08/06/14 1820 08/07/14 0420 08/07/14 0805 08/08/14 0530  NA 135  --  129*  --  130*  K 3.3* 3.5 3.1*  --  3.1*  CL 93*  --  84*  --  86*  CO2 30  --  29  --  31  GLUCOSE 134*  --  134*  --  136*  BUN 41*  --  79*  --  83*  CREATININE 4.18*  --  6.05*  --  6.06*  CALCIUM 8.6*  --  8.7*  --  8.2*  MG 2.0  --   --   --   --   PHOS 1.8*  --   --  2.2* 3.6  PROT 7.3  --   --   --  6.5  ALBUMIN 1.9*  --   --   --  1.7*  AST 31  --   --   --  27  ALT 18  --   --   --  16  ALKPHOS 160*  --   --   --  161*  BILITOT 1.0  --   --   --  1.1   Estimated Creatinine Clearance: 11.4 mL/min (by C-G formula based on Cr of 6.06).   Recent Labs  08/07/14 1839 08/07/14 2339 08/08/14 0517  GLUCAP 132* 135* 148*   Medications:  Scheduled:  . alteplase  5 mg Intracatheter STAT  . antiseptic oral rinse  7 mL Mouth Rinse q12n4p  . budesonide (PULMICORT) nebulizer solution  0.25 mg Nebulization BID  . chlorhexidine  15 mL Mouth Rinse BID  . darbepoetin (ARANESP) injection - DIALYSIS  100 mcg Intravenous Q Mon-HD  . insulin aspart  0-9 Units Subcutaneous 4 times per day  . levothyroxine  12.5 mcg Intravenous Daily  . metoprolol  2.5 mg Intravenous 3 times per day  . sodium chloride  10-40 mL  Intracatheter Q12H  . vancomycin  125 mg Oral 4 times per day   Insulin Requirements in the past 24 hours:  4 units Novolog SSI  Current Nutrition:  NPO Clinimix 5/15 (no electrolytes) at 138ml/hr and 20% IVFE at 59ml/hr which will provide 1843 kcal and 96 gm protein.  Nutritional Goals: (per RD note 4/28) 2100-2300 kCal, 120-140 grams of protein per day Goal: Clinimix 5/15 at 14ml/hr + 20% IVFE 52ml/hr to provide 2184 kcal and 120 gm protein  Assessment: 59 YOF with complicated hospital course. S/p closure perforated pyloric ulcer, SBR for ischemic necrosis, diffuse peritonitis on 07/01/14. Pharmacy consulted to provide TPN 4/1-4/15 for nutrition support for prolonged ileus. Pt then transitioned to po diet + supplements. 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  GI: Duodenal fistula. NPO. Open abd wound, (+)abd pain. Fistula output 2100  ml/24h. Albumin 2.0. Prealbumin remains low at 9.8 Noted palliative care discussions ongoing but for now pt/family wish for aggressive care. NGT out and pt refuses replacement. *TPN stopped 5/2 at 1440 during rapid response event per Dr Jonnie Finner request due to volume overload. Now TPN running at 100 ml/hr and lipids at 10 ml/hr with 5/5 note stating euvolemic to slightly dry  Endo: h/o DM. CBGs acceptable, minimal SSI, had low CBGs prior to TPN start.   Lytes: none in TPN since pt on iHD. Na 130; K 3.1 (AFib, goal >= 4)- s/p 34mEq 5/6 & down from PM check of 3.5 on 5/5, Phos 3.6- improved s/p 71mol 5/6, Ca-corr 10.4. Dr Jonnie Finner called 5/5 & asked pharmacy to manage K replacement; Current orders for 4K HD bath, next HD Monday  Renal: New ESRD - started 3/17. HD on M/W/F. Aranesp 100 qMon (decr from 150 2nd Hg). Hg   Pulm: asthma, COPD. RA. CT negative for PE. CCM signed off 5/3  Cards: HTN. (afib). Cards signed off 4/26. Midodrine and amio on hold (no NGT and NPO) IV metoprolol. CHADS-VASc =4 (not good  candidate for [facility]).  Hepatotobil: Alk phos slightly elevated with upward trend, other LFTs wnl. TG 177 (5/2)  Neuro: hx depression / insomnia - new stroke noted on 3/18.   ID: Pt continues on PO Vanc D#13/14 for cdiff colitis, no stop date on MAR . Diarrhea is better. WBC up to 16.3 on 5/4  Best Practices: SCDs, PPI IV  TPN Access: PICC line in IR 4/27 TPN start date: 4/1->4/15; restart 4/27>>  Plan:  - give Clinimix 5/15 with electrolytes for now at 100/hr and 20% IVFE at 13ml/hr. This will provide 100% support.  - Trace elements and MVI daily in TPN.  -  Continue sensitive SSI q6h - BMet in AM  Rober Minion, PharmD., MS Clinical Pharmacist Pager:  [phone] Thank you for allowing pharmacy to be part of this patients care team.

## 2014-08-08 NOTE — Progress Notes (Signed)
Pt refusing cpap tonight, stating she cant breathe with it on. Encouraged pt to call if she changes her mind and is willing to try it, she communicates understanding.

## 2014-08-09 LAB — BASIC METABOLIC PANEL
ANION GAP: 18 — AB (ref 5–15)
Anion gap: 16 — ABNORMAL HIGH (ref 5–15)
BUN: 115 mg/dL — ABNORMAL HIGH (ref 6–20)
BUN: 104 mg/dL — ABNORMAL HIGH (ref 6–20)
CO2: 28 mmol/L (ref 22–32)
CO2: 25 mmol/L (ref 22–32)
Calcium: 8.4 mg/dL — ABNORMAL LOW (ref 8.9–10.3)
Calcium: 7.3 mg/dL — ABNORMAL LOW (ref 8.9–10.3)
Chloride: 79 mmol/L — ABNORMAL LOW (ref 101–111)
Chloride: 87 mmol/L — ABNORMAL LOW (ref 101–111)
Creatinine, Ser: 7.37 mg/dL — ABNORMAL HIGH (ref 0.44–1.00)
Creatinine, Ser: 6.73 mg/dL — ABNORMAL HIGH (ref 0.44–1.00)
GFR calc Af Amer: 6 mL/min — ABNORMAL LOW (ref >60)
GFR calc Af Amer: 7 mL/min — ABNORMAL LOW (ref >60)
GFR calc non Af Amer: 5 mL/min — ABNORMAL LOW (ref >60)
GFR, EST NON AFRICAN AMERICAN: 6 mL/min — AB (ref >60)
Glucose, Bld: 422 mg/dL — ABNORMAL HIGH (ref 70–99)
Glucose, Bld: 371 mg/dL — ABNORMAL HIGH (ref 70–99)
Potassium: 4.3 mmol/L (ref 3.5–5.1)
Potassium: 3.9 mmol/L (ref 3.5–5.1)
SODIUM: 125 mmol/L — AB (ref 135–145)
SODIUM: 128 mmol/L — AB (ref 135–145)

## 2014-08-09 LAB — GLUCOSE, CAPILLARY
GLUCOSE-CAPILLARY: 127 mg/dL — AB (ref 70–99)
GLUCOSE-CAPILLARY: 148 mg/dL — AB (ref 70–99)
GLUCOSE-CAPILLARY: 357 mg/dL — AB (ref 70–99)
Glucose-Capillary: 130 mg/dL — ABNORMAL HIGH (ref 70–99)
Glucose-Capillary: 118 mg/dL — ABNORMAL HIGH (ref 70–99)
Glucose-Capillary: 372 mg/dL — ABNORMAL HIGH (ref 70–99)
Glucose-Capillary: 42 mg/dL — CL (ref 70–99)
Glucose-Capillary: 46 mg/dL — ABNORMAL LOW (ref 70–99)
Glucose-Capillary: 110 mg/dL — ABNORMAL HIGH (ref 70–99)

## 2014-08-09 LAB — CBC
HCT: 22 % — ABNORMAL LOW (ref 36.0–46.0)
Hemoglobin: 7.2 g/dL — ABNORMAL LOW (ref 12.0–15.0)
MCH: 28.3 pg (ref 26.0–34.0)
MCHC: 32.7 g/dL (ref 30.0–36.0)
MCV: 86.6 fL (ref 78.0–100.0)
PLATELETS: 318 10*3/uL (ref 150–400)
RBC: 2.54 MIL/uL — AB (ref 3.87–5.11)
RDW: 20.5 % — ABNORMAL HIGH (ref 11.5–15.5)
WBC: 13.4 10*3/uL — AB (ref 4.0–10.5)

## 2014-08-09 LAB — IRON AND TIBC
Iron: 20 ug/dL — ABNORMAL LOW (ref 28–170)
Saturation Ratios: 14 % (ref 10.4–31.8)
TIBC: 140 ug/dL — ABNORMAL LOW (ref 250–450)
UIBC: 120 ug/dL

## 2014-08-09 MED ORDER — TRACE MINERALS CR-CU-F-FE-I-MN-MO-SE-ZN IV SOLN
INTRAVENOUS | Status: AC
  Administered 2014-08-09: 17:00:00 via INTRAVENOUS
  Filled 2014-08-09: qty 2400

## 2014-08-09 MED ORDER — DIPHENHYDRAMINE-ZINC ACETATE 2-0.1 % EX CREA
TOPICAL_CREAM | Freq: Three times a day (TID) | CUTANEOUS | Status: DC | PRN
  Administered 2014-08-09: 1 via TOPICAL
  Filled 2014-08-09 (×2): qty 28

## 2014-08-09 MED ORDER — HYDROMORPHONE HCL 1 MG/ML IJ SOLN
1.0000 mg | INTRAMUSCULAR | Status: DC | PRN
  Administered 2014-08-09 – 2014-08-10 (×4): 2 mg via INTRAVENOUS
  Administered 2014-08-10: 1 mg via INTRAVENOUS
  Administered 2014-08-10: 2 mg via INTRAVENOUS
  Administered 2014-08-11 (×3): 1 mg via INTRAVENOUS
  Administered 2014-08-12: 2 mg via INTRAVENOUS
  Administered 2014-08-12 (×2): 1 mg via INTRAVENOUS
  Administered 2014-08-12: 2 mg via INTRAVENOUS
  Filled 2014-08-09: qty 2
  Filled 2014-08-09 (×2): qty 1
  Filled 2014-08-09 (×2): qty 2
  Filled 2014-08-09 (×2): qty 1
  Filled 2014-08-09 (×6): qty 2

## 2014-08-09 MED ORDER — FAT EMULSION 20 % IV EMUL
240.0000 mL | INTRAVENOUS | Status: AC
  Administered 2014-08-09: 240 mL via INTRAVENOUS
  Filled 2014-08-09: qty 250

## 2014-08-09 MED ORDER — DEXTROSE 50 % IV SOLN
INTRAVENOUS | Status: AC
  Administered 2014-08-09: 50 mL
  Filled 2014-08-09: qty 50

## 2014-08-09 MED ORDER — DEXTROSE 50 % IV SOLN
INTRAVENOUS | Status: AC
  Filled 2014-08-09: qty 50

## 2014-08-09 MED ORDER — DEXTROSE 50 % IV SOLN
1.0000 | Freq: Once | INTRAVENOUS | Status: AC
  Administered 2014-08-09: 50 mL via INTRAVENOUS

## 2014-08-09 MED ORDER — DARBEPOETIN ALFA 200 MCG/0.4ML IJ SOSY
200.0000 ug | PREFILLED_SYRINGE | INTRAMUSCULAR | Status: DC
  Filled 2014-08-09: qty 0.4

## 2014-08-09 NOTE — Progress Notes (Signed)
39 Days Post-Op  Subjective: No change  Objective: Vital signs in last 24 hours: Temp:  [97.6 F (36.4 C)-98.6 F (37 C)] 97.7 F (36.5 C) (05/08 0800) Pulse Rate:  [108-118] 116 (05/08 0800) Resp:  [19-27] 24 (05/08 0800) BP: (78-114)/(48-61) 100/60 mmHg (05/08 0800) SpO2:  [94 %-100 %] 100 % (05/08 0800) Weight:  [85.9 kg (189 lb 6 oz)] 85.9 kg (189 lb 6 oz) (05/08 0418) Last BM Date: 07/31/14  Intake/Output from previous day: 05/07 0701 - 05/08 0700 In: 2813.7 [I.V.:280; TPN:2533.7] Out: 2200 [Drains:2200] Intake/Output this shift:    Incision/Wound:JP bilious  No blood currently  Wound open and clean with fibrinous base   Lab Results:   Recent Labs  08/08/14 0530 08/08/14 1500  WBC 13.8* 13.9*  HGB 8.1* 7.9*  HCT 25.4* 25.2*  PLT 308 314   BMET  Recent Labs  08/08/14 0530 08/09/14 0827  NA 130* 125*  K 3.1* 4.3  CL 86* 79*  CO2 31 28  GLUCOSE 136* 422*  BUN 83* 115*  CREATININE 6.06* 7.37*  CALCIUM 8.2* 8.4*   PT/INR No results for input(s): LABPROT, INR in the last 72 hours. ABG No results for input(s): PHART, HCO3 in the last 72 hours.  Invalid input(s): PCO2, PO2  Studies/Results: No results found.  Anti-infectives: Anti-infectives    Start     Dose/Rate Route Frequency Ordered Stop   08/04/14 1200  vancomycin (VANCOCIN) 50 mg/mL oral solution 125 mg  Status:  Discontinued     125 mg Oral 4 times per day 08/04/14 0851 08/08/14 1229   07/18/14 1800  vancomycin (VANCOCIN) 50 mg/mL oral solution 125 mg  Status:  Discontinued     125 mg Oral 4 times per day 07/18/14 1455 08/04/14 0845   07/17/14 1830  vancomycin (VANCOCIN) IVPB 750 mg/150 ml premix     750 mg 150 mL/hr over 60 Minutes Intravenous  Once 07/17/14 1818 07/18/14 0024   07/15/14 1400  metroNIDAZOLE (FLAGYL) tablet 500 mg  Status:  Discontinued     500 mg Oral 3 times per day 07/15/14 1202 07/18/14 1533   07/13/14 2200  piperacillin-tazobactam (ZOSYN) IVPB 2.25 g  Status:   Discontinued     2.25 g 100 mL/hr over 30 Minutes Intravenous 3 times per day 07/13/14 1318 07/24/14 1138   07/13/14 2000  fluconazole (DIFLUCAN) IVPB 200 mg  Status:  Discontinued     200 mg 100 mL/hr over 60 Minutes Intravenous Every 24 hours 07/13/14 1318 07/19/14 1027   07/07/14 1200  vancomycin (VANCOCIN) IVPB 1000 mg/200 mL premix  Status:  Discontinued     1,000 mg 200 mL/hr over 60 Minutes Intravenous Every 24 hours 07/07/14 0937 07/14/14 1712   07/06/14 2000  fluconazole (DIFLUCAN) IVPB 400 mg  Status:  Discontinued     400 mg 100 mL/hr over 120 Minutes Intravenous Every 24 hours 07/06/14 1507 07/13/14 1318   07/06/14 1800  piperacillin-tazobactam (ZOSYN) IVPB 2.25 g  Status:  Discontinued     2.25 g 100 mL/hr over 30 Minutes Intravenous 4 times per day 07/06/14 1505 07/13/14 1318   07/05/14 2000  fluconazole (DIFLUCAN) IVPB 200 mg  Status:  Discontinued     200 mg 100 mL/hr over 60 Minutes Intravenous Every 24 hours 07/05/14 0757 07/06/14 1507   07/05/14 1400  piperacillin-tazobactam (ZOSYN) IVPB 2.25 g  Status:  Discontinued     2.25 g 100 mL/hr over 30 Minutes Intravenous 3 times per day 07/05/14 0749 07/06/14 1505  07/02/14 1800  vancomycin (VANCOCIN) IVPB 1000 mg/200 mL premix  Status:  Discontinued     1,000 mg 200 mL/hr over 60 Minutes Intravenous Every 24 hours 07/01/14 1858 07/05/14 0801   07/01/14 2200  piperacillin-tazobactam (ZOSYN) IVPB 3.375 g  Status:  Discontinued     3.375 g 100 mL/hr over 30 Minutes Intravenous 4 times per day 07/01/14 1858 07/05/14 0749   07/01/14 2000  fluconazole (DIFLUCAN) IVPB 400 mg  Status:  Discontinued     400 mg 100 mL/hr over 120 Minutes Intravenous Every 24 hours 07/01/14 1858 07/05/14 0757   07/01/14 1400  fluconazole (DIFLUCAN) IVPB 200 mg  Status:  Discontinued     200 mg 100 mL/hr over 60 Minutes Intravenous Every 24 hours 07/01/14 1330 07/01/14 1853   07/01/14 1000  piperacillin-tazobactam (ZOSYN) IVPB 2.25 g  Status:   Discontinued     2.25 g 100 mL/hr over 30 Minutes Intravenous Every 8 hours 07/01/14 0952 07/01/14 1853   07/01/14 1000  vancomycin (VANCOCIN) 500 mg in sodium chloride 0.9 % 100 mL IVPB     500 mg 100 mL/hr over 60 Minutes Intravenous  Once 07/01/14 0952 07/01/14 1126   06/30/14 1200  vancomycin (VANCOCIN) IVPB 1000 mg/200 mL premix     1,000 mg 200 mL/hr over 60 Minutes Intravenous  Once 06/30/14 0944 06/30/14 1403   06/30/14 1200  ceFEPIme (MAXIPIME) 2 g in dextrose 5 % 50 mL IVPB     2 g 100 mL/hr over 30 Minutes Intravenous  Once 06/30/14 0944 06/30/14 1425   06/30/14 0100  vancomycin (VANCOCIN) 2,000 mg in sodium chloride 0.9 % 500 mL IVPB     2,000 mg 250 mL/hr over 120 Minutes Intravenous  Once 06/30/14 0048 06/30/14 0525   06/30/14 0100  ceFEPIme (MAXIPIME) 2 g in dextrose 5 % 50 mL IVPB     2 g 100 mL/hr over 30 Minutes Intravenous  Once 06/30/14 0048 06/30/14 0322   06/27/14 0600  cefUROXime (ZINACEF) 1.5 g in dextrose 5 % 50 mL IVPB     1.5 g 100 mL/hr over 30 Minutes Intravenous On call to O.R. 06/26/14 1019 06/27/14 0630   06/19/14 1800  oseltamivir (TAMIFLU) capsule 30 mg     30 mg Oral Every M-W-F (1800) 06/19/14 1003 06/22/14 1841   06/18/14 1600  oseltamivir (TAMIFLU) capsule 30 mg  Status:  Discontinued     30 mg Oral Daily 06/18/14 1538 06/19/14 1003      Assessment/Plan: s/p Procedure(s): EXPLORATORY LAPAROTOMY WITH CLOSURE OF PERFORATED PYLORIC ULCER (N/A) SMALL BOWEL RESECTION (N/A) No change unfortunately Some bloody drainage from JP but now bilious Will switch to ILWS for now and monitor  LOS: 53 days    Stephanie Jenne A. 08/09/2014

## 2014-08-09 NOTE — Progress Notes (Signed)
Progress Note [facility].   Mahli C Hank [id number] DOB: 03-10-1956 DOA: 06/17/2014 PCP: Willey BladeEAN, [name], MD  Admit HPI / Brief Narrative18: 59 yo female smoker with hx HTN, ESRD on HD (just began this admit), COPD, failed L AV fistula, initially admitted 3/16 with flu and acute hypoxic resp failure. Ultimately tx to [facility] due to need for HD. 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   Patient on 3/31 was taking to the OR for ex lap for per pyloric ulcer and sm bowel resection. Patient develops septic shock on 4-1, she was started on pressors and wean off pressors on 4-3. Subsequently she was diagnosed with pelvic abscess by CT scan perform on 4-7. She underwent pelvic drain placement by IR. Patient also develops C. diff diagnosed 4-12.  C diff was not responding flagyl, and she was started on vancomycin 4-16. She also develops  run of SVT. Cardiology has been helping with management. Course complicated with high RV pressure and concerns for PE; . CT angio negative for PE. Also with fistula formation between anastomosis and blake drain, making her back to NPO state and in need of TPN. Family want everything to be done. Palliative care consulted and following. Currently Full code. Patient on 5-2 develops hypoxemia, hypotension and SVT during dialysis treatment. Dialysis was stop. She received IV bolus. She was started on BIPAP. She was transfer to Step down unit. CCM and cardiology re consulted.Her Hr decrease, BP improved. She was taken off BIPAP. Now stable, on TNA, off Abx  HPI/Subjective: Asking about food again, feels miserable, itching, in pain, drain with dark blood  Assessment/Plan: Acute hypoxic Respiratory Failure:  -2nd to H1N1, hypervolemia, HCAP >> resolved.  -also has COPD and PAH -improving, now weaned off O2 -Nebulizer PRN and CPAP QHS   Septic shock  -due to perforated prepyloric ulcer, requiring pressors -resolved  C. difficile colitis -C diff  positive on 4-12, Received 4 days of flagyl without improvement, then started on PO vanc -Completed 3weeks of Oral Vanc-stopped 5/7, this was 1 week after IV Zosyn was stopped, stopped 5/5 -Diarrhea  resolved  Status post exploratory laparotomy with closure of perforated pyloric ulcer/abdominal abscess / small bowel resection w/ postop ileus and now with fistula -Pelvic abscess s/p percutaneous drain placement 4/8; now with leakage from anastomosis site -Received  Zosyn for 23 days. IV antibiotics discontinued by Surgery on 4/22, also Received 18 days of Vancomycin and fluconazole.  -Due to Fistula is back to NPO state, TPN.  -Fistula study done shows JP drain connection to the duodenal bulb 5-02. repeat CT abdomen with  3.0 x 3.5 x 5.7 cm collection in the pelvic cul-de-sac and extraluminal air alongside JP drain in anterior abd wall -Per CCS, plan of repeat CT next week -Drain with old and new blood, Hb trending down, will likely need a transfusion tomorrow  Hypokalemia;  -replace in TNA  PSVT/concerns for pulmonary embolism given extremely high heart right side pressure -Has been evaluated by EP, no further episodes for 48h -Cardiology following peripherally- follow K+ and Mg  -Continue IV metoprolol, now off amiodarone;  -V-Q scan with intermediate probability for pulmonary embolism.  -CTA 4/27, negative for PE   Small PFO Has been evaluated by Cardiology  ACA CVA with Lt sided weakness -TEE ;normal LV function; no LAA thrombus -Started on ASA 81mg  per NEuro, now NPO -Will need ongoing long term rehab - PT/OT to cont to follow while inpatient Patient  remains very weak and deconditioned .   ESRD new start HD this admit  AKi due to sepsis, shock Ongoing hemodialysis per Nephrology - perm-cath placed 3/26 d/t AVF problems Patient to have AV fistula reviewed at some point for permanent access (most likely in outpatient setting) .  Patient will need to be able to sit up for  Outpatient dialysis. At this point still unable to sit up for HD -she was unable to complete dialysis treatment 5-2 due to hypotension, SVT.   Chronic diastolic congestive heart failure w/ Severe RHF EF 60-65%, grade 1 diastolic dysfunction. Appears compensated.  Fluids/volume managed with hemodialysis.  Elevated PA pressures on 2-D echo; CTA negative for PE>   Anemia of critical illness/chronic disease -and bleeding from leak/fistula Hgb presently stable s/p 2U PRBC 4/13 - follow  Transfuse of <7, likely tomorrow Aranesp/iron per renal  Thrombocytopenia; resolved.  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 On TPN now, CBGs in 120-160s Hbaic 6.5  Abnormal TSH -Free T4 WNL, T3 1.8 -most likely sick thyroid with ongoing infection and body stress; also due to amiodarone use. TSH in 7 range -continue low dose synthroid given PSVT, now on IV synthroid   L Renal mass  -noted on [place], Indeterminate 1.9 cm hypoechoic mass off the interpolar region of the left kidney -may potentially represent a cyst however solid mass is not excluded -needs  Outpatient FU of this  Obesity - Inadequate oral intake related to altered GI function as evidenced by limited intake, ongoing.  Body mass index is 30.58 kg/(m^2). -patient weight trending down -patient unable to tolerate diet and with leaking fistula  Code Status: FULL Family Communication: discussed with patient.  Disposition Plan: VERY POOR PROGNOSIS, UNREALISTIC EXPECTATIONS, GIVEN AKI/ESRD, ONGOING SURGICAL ISSUES, NPO, PALLIATIVE FOLLOWING, wants to continue aggressive course including FULL CODE  Consultants: Cardiology Nephrology PCCM Gen Surgery  Palliative care  Significant Events: 3/16 influenza  A+ 3/18 R ACA stroke 3/19 MRI multifocal acute infarction of both hemispheres, large right distal ACA 3/22 TEE severe RAE, severely reduced RV function, severe TR, no LAA thrombus, small PFO on TEE 3/31 to OR ex lap for per pyloric ulcer and sm bowel resection 4/1 septic shock on pressors 4/3 pressors weaned off  4/4 restart pressors, CRRT 4/6 levo switched to neo 4/7 pelvic abscess on CT 4/8 IR placed pelvic drain 4/9 off pressors 4/11 long run of SVT. 4/23 VQ scan which demonstrated intermediate probability for PE 4/24 CT angiogram of her chest: Pending/unable to be done due to lack of access   Antibiotics: Zosyn >stopped on 4/22 (received a total of 23 days of treatment ) Vancomycin 4-16  DVT prophylaxis: SCDs  Objective: Blood pressure 100/60, pulse 117, temperature 97.7 F (36.5 C), temperature source Oral, resp. rate 20, height  (1.676 m), weight 85.9 kg (189 lb 6 oz), SpO2 100 %.  Intake/Output Summary (Last 24 hours) at 08/09/14 1110 Last data filed at 08/09/14 0616  Gross per 24 hour  Intake 2733.66 ml  Output   1175 ml  Net 1558.66 ml   Exam: General: AAOx3, chronically ill appearing Lungs: poor air movement Cardiovascular: Regular rate, no murmurs and no gallops   Abdomen: Abdominal wound dressing, clean and dry; abdomen slightly distended, drain intact and with /green drainage; mild  tenderness   Extremities: no edema   Data Reviewed: Basic Metabolic Panel:  Recent Labs Lab 08/03/14 0500 08/04/14 0754 08/05/14 0400 08/06/14 0410 08/06/14 1820 08/07/14 0420 08/07/14 0805 08/08/14 0530 08/09/14 0827  NA 133* 131* 130*  130* 135  --  129*  --  130* 125*  K 3.4* 3.4* 3.4*  3.3* 3.3* 3.5 3.1*  --  3.1* 4.3  CL 92* 91* 88*  88* 93*  --  84*  --  86* 79*  CO2 27 28 25  26 30   --  29  --  31 28  GLUCOSE 138* 137* 156*  156* 134*  --  134*  --  136* 422*  BUN 59* 53* 81*  80* 41*  --  79*  --  83* 115*  CREATININE 6.38* 5.73* 7.27*  7.28*  4.18*  --  6.05*  --  6.06* 7.37*  CALCIUM 8.4* 8.4* 8.4*  8.4* 8.6*  --  8.7*  --  8.2* 8.4*  MG 1.4* 1.4* 1.8 2.0  --   --   --   --   --   PHOS 1.7* 2.0* 4.4  4.4 1.8*  --   --  2.2* 3.6  --     Liver Function Tests:  Recent Labs Lab 08/03/14 0500 08/04/14 0754 08/05/14 0400 08/06/14 0410 08/08/14 0530  AST 29  --   --  31 27  ALT 17  --   --  18 16  ALKPHOS 148*  --   --  160* 161*  BILITOT 1.1  --   --  1.0 1.1  PROT 7.3  --   --  7.3 6.5  ALBUMIN 2.0* 2.1* 2.0* 1.9* 1.7*   CBC:  Recent Labs Lab 08/03/14 0500  08/05/14 0400 08/07/14 0420 08/08/14 0530 08/08/14 1500 08/09/14 0905  WBC 11.2*  < > 16.3* 15.5* 13.8* 13.9* 13.4*  NEUTROABS 7.8*  --   --   --   --   --   --   HGB 11.3*  < > 9.3* 9.3* 8.1* 7.9* 7.2*  HCT 35.0*  < > 28.9* 28.8* 25.4* 25.2* 22.0*  MCV 90.7  < > 87.8 86.7 87.6 88.7 86.6  PLT 243  < > 285 312 308 314 318  < > = values in this interval not displayed.  CBG:  Recent Labs Lab 08/08/14 1806 08/09/14 0014 08/09/14 0551 08/09/14 0827 08/09/14 0859  GLUCAP 129* 130* 118* 372* 357*    Scheduled Meds:  Scheduled Meds: . antiseptic oral rinse  7 mL Mouth Rinse q12n4p  . budesonide (PULMICORT) nebulizer solution  0.25 mg Nebulization BID  . chlorhexidine  15 mL Mouth Rinse BID  . [START ON 08/10/2014] darbepoetin (ARANESP) injection - DIALYSIS  200 mcg Intravenous Q Mon-HD  . insulin aspart  0-9 Units Subcutaneous 4 times per day  . levothyroxine  12.5 mcg Intravenous Daily  . metoprolol  2.5 mg Intravenous 3 times per day  . sodium chloride  10-40 mL Intracatheter Q12H    Time spent on care of this patient: 35 minutes   Zannie CoveJOSEPH,Lorn Butcher , MD  [phone] [facility] Office  [phone]   If 7PM-7AM, please contact night-coverage [email or web address] Password [email or web address] 08/09/2014, 11:10 AM   LOS: 53 days

## 2014-08-09 NOTE — Progress Notes (Signed)
Hypoglycemic Event  CBG: 46  Treatment: D50 IV 50 mL  Symptoms: None  Follow-up CBG: Time:1320 CBG Result:127  Possible Reasons for Event: Unknown  Comments/MD notified: MD Zannie CovePreetha Joseph notified and aware.    Gracee Ratterree, Lurena JoinerRebecca A  Remember to initiate Hypoglycemia Order Set & complete

## 2014-08-09 NOTE — Progress Notes (Signed)
Pt placed on CPAP at 2032. RN called Homero FellersFrank, RRT to come and take mask off patient. She stated that she did not feel comfortable with the mask on.

## 2014-08-09 NOTE — Progress Notes (Signed)
Drainage from JP continues to be dark green with dark red streaks. MD aware. Will continue to monitor.

## 2014-08-09 NOTE — Progress Notes (Addendum)
PARENTERAL NUTRITION CONSULT NOTE - FOLLOW UP  Pharmacy Consult for TPN Indication:  High output Duodenal Fistula  No Known Allergies  Patient Measurements: Height: 5\' 6"  (167.6 cm) Weight: 189 lb 6 oz (85.9 kg) IBW/kg (Calculated) : 59.3 Adjusted Body Weight: 71.2kg  Vital Signs: Temp: 98.6 F (37 C) (05/08 0418) Temp Source: Oral (05/08 0418) BP: 94/56 mmHg (05/08 0700) Pulse Rate: 114 (05/08 0700) Intake/Output from previous day: 05/07 0701 - 05/08 0700 In: 2813.7 [I.V.:280; TPN:2533.7] Out: 2200 [Drains:2200]  Labs:  Recent Labs  08/07/14 0420 08/08/14 0530 08/08/14 1500  WBC 15.5* 13.8* 13.9*  HGB 9.3* 8.1* 7.9*  HCT 28.8* 25.4* 25.2*  PLT 312 308 314    Recent Labs  08/06/14 1820 08/07/14 0420 08/07/14 0805 08/08/14 0530  NA  --  129*  --  130*  K 3.5 3.1*  --  3.1*  CL  --  84*  --  86*  CO2  --  29  --  31  GLUCOSE  --  134*  --  136*  BUN  --  79*  --  83*  CREATININE  --  6.05*  --  6.06*  CALCIUM  --  8.7*  --  8.2*  PHOS  --   --  2.2* 3.6  PROT  --   --   --  6.5  ALBUMIN  --   --   --  1.7*  AST  --   --   --  27  ALT  --   --   --  16  ALKPHOS  --   --   --  161*  BILITOT  --   --   --  1.1   Estimated Creatinine Clearance: 11.2 mL/min (by C-G formula based on Cr of 6.06).   Recent Labs  08/08/14 1806 08/09/14 0014 08/09/14 0551  GLUCAP 129* 130* 118*   Medications:  Scheduled:  . antiseptic oral rinse  7 mL Mouth Rinse q12n4p  . budesonide (PULMICORT) nebulizer solution  0.25 mg Nebulization BID  . chlorhexidine  15 mL Mouth Rinse BID  . darbepoetin (ARANESP) injection - DIALYSIS  100 mcg Intravenous Q Mon-HD  . insulin aspart  0-9 Units Subcutaneous 4 times per day  . levothyroxine  12.5 mcg Intravenous Daily  . metoprolol  2.5 mg Intravenous 3 times per day  . sodium chloride  10-40 mL Intracatheter Q12H   Insulin Requirements in the past 24 hours:  4 units Novolog SSI  Current Nutrition:  NPO Clinimix 5/15 (no  electrolytes) at 119ml/hr and 20% IVFE at 59ml/hr which will provide 1843 kcal and 96 gm protein.  Nutritional Goals: (per RD note 4/28) 2100-2300 kCal, 120-140 grams of protein per day Goal: Clinimix 5/15 at 176ml/hr + 20% IVFE 45ml/hr to provide 2184 kcal and 120 gm protein  Assessment: 64 YOF with complicated hospital course. S/p closure perforated pyloric ulcer, SBR for ischemic necrosis, diffuse peritonitis on 07/01/14. Pharmacy consulted to provide TPN 4/1-4/15 for nutrition support for prolonged ileus. Pt then transitioned to po diet + supplements. 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  GI: Duodenal fistula. NPO. Open abd wound, (+)abd pain. Drain output still high and changed from bile to bright red blood overnight.  Albumin 2.0. Prealbumin remains low at 9.8 Noted palliative care discussions ongoing but for now pt/family wish for aggressive care. NGT out and pt refuses replacement.  She had  *TPN stopped 5/2 at 1440 during  rapid response event per Dr Jonnie Finner request due to volume overload. Now TPN running at 100 ml/hr and lipids at 10 ml/hr with 5/5 note stating euvolemic to slightly dry  Endo: h/o DM. CBGs good 118-151, minimal SSI, had low CBGs prior to TPN start.   Lytes: Labs not drawn yet today - added lytes to TPN yesterday -  K+ -(AFib, goal >= 4)  Current orders for 4K HD bath, next HD Monday - Dr. Jonnie Finner wants pharmacy to manage K+.  Renal: New ESRD - started 3/17. HD on M/W/F. Aranesp 100 qMon (decr from 150 2nd Hg). Hg   Pulm: asthma, COPD. RA. CT negative for PE. CCM signed off 5/3  Cards: HTN. (afib). Cards signed off 4/26. Midodrine and amio on hold (no NGT and NPO) IV metoprolol. CHADS-VASc =4 (not good candidate for [facility]).  Hepatotobil: Alk phos slightly elevated with upward trend, other LFTs wnl. TG 177 (5/2)  Neuro: hx depression / insomnia - new stroke noted on 3/18.   ID: Pt continues on PO Vanc D#14/14 for cdiff  colitis, no stop date on MAR . Diarrhea is better. WBC up to 16.3 on 5/4  Best Practices: SCDs, PPI IV  TPN Access: PICC line in IR 4/27 TPN start date: 4/1->4/15; restart 4/27>>  Plan:  -  give Clinimix 5/15 with electrolytes for now at 100/hr and 20% IVFE at 65ml/hr. -  Will f/u potassium level once Bmet is drawn.  -  Trace elements and MVI daily in TPN.  -  Continue sensitive SSI q6h -  TPN labs in the morning  Rober Minion, PharmD., MS Clinical Pharmacist Pager:  [phone] Thank you for allowing pharmacy to be part of this patients care team.  Addendum: BMET this am now reveals K+ = 4.3 which is which is where we'd like to be for Afib - tenuous though with renal fx.  She has a K+ bath with HD, will see if renal adjust this tomorrow before HD or if we need to remove electrolytes from her next TPN.  Rober Minion, PharmD., MS

## 2014-08-09 NOTE — Progress Notes (Addendum)
[facility] Progress Note   Subjective: no complaints. I/O 2800 in and 2200 out primarily from duodenal drain. Some blood in drain effluent overnight, surg aware  Filed Vitals:   08/09/14 0418 08/09/14 0700 08/09/14 0800 08/09/14 1014  BP: 80/50 94/56 100/60   Pulse: 116 114 116 117  Temp: 98.6 F (37 C)  97.7 F (36.5 C)   TempSrc: Oral  Oral   Resp: 22 22 24 20   Height:      Weight: 85.9 kg (189 lb 6 oz)     SpO2: 99% 98% 100%    Exam: Lying flat, alert, no distress No jvd Chest clear bilat Abd RUQ drain connected to wall suction, open abd wound w packing Ext no edema Access is R IJ cath and left arm AVF +bruit Neuro is ox 3, L arm stronger than a few weeks ago  HD: new start, MWF schedule in hospital  Summary: 59 yo w CKD stage V, s/p AVF placement, smoker, HTN, obesity presented 06/17/14 w SOB/ gen weakness x 2 wks. Had worsening azotemia/ uremia/ acidosis, also H1N1 flu+. Admitted and rec'd her first HD on 06/18/14 which improved resp status. On 3/18 pt noted to be hemiparetic on the left side and CT scan showed R ACA infarct. MRI noted acute infarction throughout both hemispheres suggesting embolic phenomenon. On 3/26 she had tunneled HD cath placed due to immaturity of the AVF. On 3/30 she underwent exlap for acute abd pain and abd tenderness w AMS. CT had showed pneumoperitoneum. Exlap showed diffuse peritonitis w perforated pyloric ulcer and ischemic area of SB in mid ileum. Underwent patch repair of perf ulcer and segmental resection of ischemic ileum. Was on CRRT from 3/30- 07/13/14 and is now back on intermittent HD.          Assessment: 1. ESRD new start MWF inpatient, using cath as AVF poor fxn 2. CVA acute w left hemiparesis 3. S/P exlap for perf pyloric ulcer/ SBR for ischemic SB 3/30 - now w high output duodenal fistula 4. Abd wound - open, packing per surg 5. Nutrition - TNA for now 6. Volume - large GI losses have balanced vol load from TNA, was  on midodrine for low BP's but is off now 7. Pelvic fluid collection - s/p IR drain, out now 8. 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 Anemia Hb dropping in to low 7's, ^darbe 100> 200/wk max dose, recheck tsat 11. 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 DM2 14. 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 Hypokalemia - due to large GI losses, pharm replacing. 4K bath w HD. Better 16. Hypomagnesemia - better 17. Hyponatremia - from dilute TNA, treat w HD today  Plan - Extra HD today for azotemia/ hyponatremia, increase Na in bath to max 145. HD Monday per MWF schedule also. Fe/tibc, Ortencia Kick^darbe    Rob Aashika Carta MD  pager [phone]    cell [phone]  08/09/2014, 10:31 AM     Recent Labs Lab 08/06/14 0410  08/07/14 0420 08/07/14 0805 08/08/14 0530 08/09/14 0827  NA 135  --  129*  --  130* 125*  K 3.3*  < > 3.1*  --  3.1* 4.3  CL 93*  --  84*  --  86* 79*  CO2 30  --  29  --  31 28  GLUCOSE 134*  --  134*  --  136* 422*  BUN 41*  --  79*  --  83* 115*  CREATININE 4.18*  --  6.05*  --  6.06* 7.37*  CALCIUM 8.6*  --  8.7*  --  8.2* 8.4*  PHOS 1.8*  --   --  2.2* 3.6  --   < > = values in this interval not displayed.  Recent Labs Lab 08/03/14 0500  08/05/14 0400 08/06/14 0410 08/08/14 0530  AST 29  --   --  31 27  ALT 17  --   --  18 16  ALKPHOS 148*  --   --  160* 161*  BILITOT 1.1  --   --  1.0 1.1  PROT 7.3  --   --  7.3 6.5  ALBUMIN 2.0*  < > 2.0* 1.9* 1.7*  < > = values in this interval not displayed.  Recent Labs Lab 08/03/14 0500  08/08/14 0530 08/08/14 1500 08/09/14 0905  WBC 11.2*  < > 13.8* 13.9* 13.4*  NEUTROABS 7.8*  --   --   --   --   HGB 11.3*  < > 8.1* 7.9* 7.2*  HCT 35.0*  < > 25.4* 25.2* 22.0*  MCV 90.7  < > 87.6 88.7 86.6  PLT 243  < > 308 314 318  < > = values in this interval not displayed. Marland Kitchen. antiseptic oral rinse  7 mL Mouth Rinse q12n4p  . budesonide (PULMICORT) nebulizer solution  0.25 mg Nebulization BID  .  chlorhexidine  15 mL Mouth Rinse BID  . darbepoetin (ARANESP) injection - DIALYSIS  100 mcg Intravenous Q Mon-HD  . insulin aspart  0-9 Units Subcutaneous 4 times per day  . levothyroxine  12.5 mcg Intravenous Daily  . metoprolol  2.5 mg Intravenous 3 times per day  . sodium chloride  10-40 mL Intracatheter Q12H   . Marland Kitchen.TPN (CLINIMIX-E) Adult 100 mL/hr at 08/08/14 2300   And  . fat emulsion 10 kcal (08/08/14 2300)  . Marland Kitchen.TPN (CLINIMIX-E) Adult     And  . fat emulsion    . sodium chloride 10 mL/hr at 08/08/14 2000   bisacodyl, diphenhydrAMINE, fentaNYL (SUBLIMAZE) injection, levalbuterol, ondansetron (ZOFRAN) IV, sodium chloride

## 2014-08-10 LAB — COMPREHENSIVE METABOLIC PANEL
ALT: 15 U/L (ref 14–54)
AST: 25 U/L (ref 15–41)
Albumin: 1.7 g/dL — ABNORMAL LOW (ref 3.5–5.0)
Alkaline Phosphatase: 170 U/L — ABNORMAL HIGH (ref 38–126)
Anion gap: 22 — ABNORMAL HIGH (ref 5–15)
BILIRUBIN TOTAL: 1 mg/dL (ref 0.3–1.2)
BUN: 143 mg/dL — ABNORMAL HIGH (ref 6–20)
CO2: 26 mmol/L (ref 22–32)
Calcium: 8.2 mg/dL — ABNORMAL LOW (ref 8.9–10.3)
Chloride: 79 mmol/L — ABNORMAL LOW (ref 101–111)
Creatinine, Ser: 8.9 mg/dL — ABNORMAL HIGH (ref 0.44–1.00)
GFR calc Af Amer: 5 mL/min — ABNORMAL LOW (ref >60)
GFR, EST NON AFRICAN AMERICAN: 4 mL/min — AB (ref >60)
GLUCOSE: 97 mg/dL (ref 70–99)
POTASSIUM: 4.1 mmol/L (ref 3.5–5.1)
Sodium: 127 mmol/L — ABNORMAL LOW (ref 135–145)
TOTAL PROTEIN: 6.6 g/dL (ref 6.5–8.1)

## 2014-08-10 LAB — DIFFERENTIAL
BASOS ABS: 0.2 10*3/uL — AB (ref 0.0–0.1)
Basophils Relative: 1 % (ref 0–1)
Eosinophils Absolute: 0.5 10*3/uL (ref 0.0–0.7)
Eosinophils Relative: 3 % (ref 0–5)
LYMPHS PCT: 12 % (ref 12–46)
Lymphs Abs: 2 10*3/uL (ref 0.7–4.0)
MONO ABS: 1.8 10*3/uL — AB (ref 0.1–1.0)
Monocytes Relative: 11 % (ref 3–12)
Neutro Abs: 11.9 10*3/uL — ABNORMAL HIGH (ref 1.7–7.7)
Neutrophils Relative %: 73 % (ref 43–77)

## 2014-08-10 LAB — GLUCOSE, CAPILLARY
Glucose-Capillary: 58 mg/dL — ABNORMAL LOW (ref 70–99)
Glucose-Capillary: 101 mg/dL — ABNORMAL HIGH (ref 70–99)
Glucose-Capillary: 110 mg/dL — ABNORMAL HIGH (ref 70–99)
Glucose-Capillary: 115 mg/dL — ABNORMAL HIGH (ref 70–99)
Glucose-Capillary: 127 mg/dL — ABNORMAL HIGH (ref 70–99)

## 2014-08-10 LAB — MAGNESIUM: MAGNESIUM: 2 mg/dL (ref 1.7–2.4)

## 2014-08-10 LAB — CULTURE, BLOOD (ROUTINE X 2)
Culture: NO GROWTH
Culture: NO GROWTH

## 2014-08-10 LAB — CBC
HCT: 24.1 % — ABNORMAL LOW (ref 36.0–46.0)
Hemoglobin: 7.8 g/dL — ABNORMAL LOW (ref 12.0–15.0)
MCH: 28.5 pg (ref 26.0–34.0)
MCHC: 32.4 g/dL (ref 30.0–36.0)
MCV: 88 fL (ref 78.0–100.0)
Platelets: 357 10*3/uL (ref 150–400)
RBC: 2.74 MIL/uL — ABNORMAL LOW (ref 3.87–5.11)
RDW: 20.5 % — ABNORMAL HIGH (ref 11.5–15.5)
WBC: 16.4 10*3/uL — AB (ref 4.0–10.5)

## 2014-08-10 LAB — PHOSPHORUS: Phosphorus: 8.3 mg/dL — ABNORMAL HIGH (ref 2.5–4.6)

## 2014-08-10 LAB — VITAMIN B12: VITAMIN B 12: 906 pg/mL (ref 180–914)

## 2014-08-10 LAB — PREALBUMIN: PREALBUMIN: 9.4 mg/dL — AB (ref 18–38)

## 2014-08-10 LAB — TRIGLYCERIDES: TRIGLYCERIDES: 141 mg/dL (ref <150)

## 2014-08-10 MED ORDER — SODIUM CHLORIDE 0.9 % IV SOLN
50.0000 ug/h | Freq: Three times a day (TID) | INTRAVENOUS | Status: DC
  Administered 2014-08-10: 50 ug/h via INTRAVENOUS
  Filled 2014-08-10 (×6): qty 1

## 2014-08-10 MED ORDER — DIPHENHYDRAMINE HCL 50 MG/ML IJ SOLN
INTRAMUSCULAR | Status: AC
  Filled 2014-08-10: qty 1

## 2014-08-10 MED ORDER — LEVALBUTEROL HCL 0.63 MG/3ML IN NEBU
0.6300 mg | INHALATION_SOLUTION | RESPIRATORY_TRACT | Status: DC | PRN
  Administered 2014-08-13: 0.63 mg via RESPIRATORY_TRACT
  Filled 2014-08-10 (×2): qty 3

## 2014-08-10 MED ORDER — NEPRO/CARBSTEADY PO LIQD
237.0000 mL | ORAL | Status: DC | PRN
  Filled 2014-08-10: qty 237

## 2014-08-10 MED ORDER — LIDOCAINE HCL (PF) 1 % IJ SOLN: 5.0000 mL | INTRAMUSCULAR | Status: DC | PRN

## 2014-08-10 MED ORDER — DARBEPOETIN ALFA 200 MCG/0.4ML IJ SOSY
PREFILLED_SYRINGE | INTRAMUSCULAR | Status: AC
  Administered 2014-08-10: 200 ug
  Filled 2014-08-10: qty 0.4

## 2014-08-10 MED ORDER — ALTEPLASE 2 MG IJ SOLR
2.0000 mg | Freq: Once | INTRAMUSCULAR | Status: DC | PRN
  Filled 2014-08-10: qty 2

## 2014-08-10 MED ORDER — SODIUM CHLORIDE 0.9 % IV SOLN: 100.0000 mL | INTRAVENOUS | Status: DC | PRN

## 2014-08-10 MED ORDER — HEPARIN SODIUM (PORCINE) 1000 UNIT/ML DIALYSIS: 1000.0000 [IU] | INTRAMUSCULAR | Status: DC | PRN

## 2014-08-10 MED ORDER — PENTAFLUOROPROP-TETRAFLUOROETH EX AERO: 1.0000 "application " | INHALATION_SPRAY | CUTANEOUS | Status: DC | PRN

## 2014-08-10 MED ORDER — LIDOCAINE-PRILOCAINE 2.5-2.5 % EX CREA: 1.0000 "application " | TOPICAL_CREAM | CUTANEOUS | Status: DC | PRN

## 2014-08-10 MED ORDER — NEPRO/CARBSTEADY PO LIQD: 237.0000 mL | ORAL | Status: DC | PRN

## 2014-08-10 MED ORDER — ALTEPLASE 2 MG IJ SOLR: 2.0000 mg | Freq: Once | INTRAMUSCULAR | Status: DC | PRN

## 2014-08-10 MED ORDER — FAT EMULSION 20 % IV EMUL
240.0000 mL | INTRAVENOUS | Status: AC
  Administered 2014-08-10: 240 mL via INTRAVENOUS

## 2014-08-10 MED ORDER — DIPHENHYDRAMINE HCL 50 MG/ML IJ SOLN
25.0000 mg | Freq: Once | INTRAMUSCULAR | Status: AC
  Administered 2014-08-10: 25 mg via INTRAVENOUS
  Filled 2014-08-10: qty 1

## 2014-08-10 MED ORDER — TRACE MINERALS CR-CU-F-FE-I-MN-MO-SE-ZN IV SOLN
INTRAVENOUS | Status: AC
  Administered 2014-08-10: 18:00:00 via INTRAVENOUS

## 2014-08-10 NOTE — Progress Notes (Signed)
Physical Therapy Treatment Patient Details Name: [name] MRN: [id number] DOB: [birth year] Today's Date: 08/10/2014    History of Present Illness [name] is a 59 y.o. female with a history of CKD and hypertension [facility] complaining of shortness of breath and generalized weakness x 2 weeks. Admitted with acute hypoxemic respiratory failure. During hospital stay noted to have weakness of her left side. 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  She developed septic shock 4/1 with pressors weaned off 4/3.  Began CRRT and pressors restarted 4/4; Developed pelvic abcess 4/7 with pelvic drain placed 4/8.  weaned off pressors 4/9.  Runs of SVT 4/5 and 4/11    PT Comments    Making visible progress that she can see now.  Emphasized standing w/shift, pre-gait, transfers.   Follow Up Recommendations  SNF;Supervision/Assistance - 24 hour     Equipment Recommendations   (TBA)    Recommendations for Other Services       Precautions / Restrictions Precautions Precautions: Fall Precaution Comments: JP drain attached to wall suction     Mobility  Bed Mobility Overal bed mobility: Needs Assistance Bed Mobility: Supine to Sit;Sit to Supine     Supine to sit: Mod assist        Transfers Overall transfer level: Needs assistance Equipment used: 2 person hand held assist (with chair back to assist) Transfers: Sit to/from Stand;Stand Pivot Transfers Sit to Stand: Mod assist;+2 safety/equipment Stand pivot transfers: Mod assist;+2 physical assistance       General transfer comment: cues for hand placement assist to come forward, graded L knee assist.  Ambulation/Gait                 Stairs            Wheelchair Mobility    Modified Rankin (Stroke Patients Only) Modified Rankin (Stroke Patients Only) Pre-Morbid  Rankin Score: Slight disability Modified Rankin: Severe disability     Balance Overall balance assessment: Needs assistance Sitting-balance support: No upper extremity supported Sitting balance-Leahy Scale: Fair Sitting balance - Comments: sitting EOB well, doesn't w/shift or scoot well.   Standing balance support: Bilateral upper extremity supported Standing balance-Leahy Scale: Poor                      Cognition Arousal/Alertness: Awake/alert Behavior During Therapy: WFL for tasks assessed/performed Overall Cognitive Status: Within Functional Limits for tasks assessed                      Exercises      General Comments        Pertinent Vitals/Pain Pain Assessment: No/denies pain    Home Living                      Prior Function            PT Goals (current goals can now be found in the care plan section) Acute Rehab PT Goals Patient Stated Goal: to get better  PT Goal Formulation: With patient Time For Goal Achievement: 08/12/14 Potential to Achieve Goals: Fair Progress towards PT goals: Progressing toward goals    Frequency  Min 3X/week    PT Plan Current plan remains appropriate    Co-evaluation  End of Session   Activity Tolerance: Patient tolerated treatment well Patient left: in chair;with call bell/phone within reach;with chair alarm set     Time: 1545-1619 PT Time Calculation (min) (ACUTE ONLY): 34 min  Charges:  $Therapeutic Activity: 23-37 mins                    G Codes:      Laneisha Mino, Eliseo GumKenneth V 08/10/2014, 5:26 PM 08/10/2014  Strong BingKen Elysa Womac, PT [phone]  (pager)

## 2014-08-10 NOTE — Progress Notes (Signed)
[name], female   DOB: 10-17-1955, 59 y.o.   MRN: [id number]     [facility]      [address] [place], [place] [address]    Phone: [phone] FAX: [phone]     Subjective: Pt feels weak and tired. Less drain output. 959ml.  WBC up.  Afebrile.    Objective:  Vital signs:  Filed Vitals:   08/10/14 0830 08/10/14 0900 08/10/14 0907 08/10/14 0930  BP: 93/49 98/65  109/58  Pulse: 115 135  137  Temp:      TempSrc:      Resp:      Height:      Weight:      SpO2:   98%     Last BM Date: 07/31/14  Intake/Output   Yesterday:  05/08 0701 - 05/09 0700 In: 1430 [TPN:1430] Out: 950 [Drains:950] This shift: I/O last 3 completed shifts: In: 4063.7 [I.V.:100] Out: 1675 [Drains:1675]    Physical Exam: General: Pt awake/alert/oriented x4 in no acute distress Tender around abd wound.  Wound is clean, fascia is intact.  JP drain with bilious output.   No evidence of peritonitis.  No incarcerated hernias.   Problem List:   Principal Problem:   Acute respiratory failure with hypoxia Active Problems:   Cerebral thrombosis with cerebral infarction   Hypertension   ESRD needing dialysis   Obesity (BMI 30-39.9)   Depression   Left renal mass   Chronic diastolic heart failure   Y0V3 influenza   Tobacco use disorder   Renal failure (ARF), acute on chronic   Hyperlipidemia   PSVT (paroxysmal supraventricular tachycardia)   SOB (shortness of breath)   Perforated gastric ulcer   Abnormal TSH   History of intermediate V/Q scan   HIT (heparin-induced thrombocytopenia)   Fistula   Abdominal pain   Palliative care encounter   ESRD (end stage renal disease)   SVT (supraventricular tachycardia)   Right heart failure    Results:   Labs: Results for orders placed or performed during the hospital encounter of 06/17/14 (from the past 48 hour(s))  Glucose, capillary     Status: Abnormal   Collection Time:  08/08/14  1:03 PM  Result Value Ref Range   Glucose-Capillary 151 (H) 70 - 99 mg/dL  CBC     Status: Abnormal   Collection Time: 08/08/14  3:00 PM  Result Value Ref Range   WBC 13.9 (H) 4.0 - 10.5 K/uL   RBC 2.84 (L) 3.87 - 5.11 MIL/uL   Hemoglobin 7.9 (L) 12.0 - 15.0 g/dL   HCT 25.2 (L) 36.0 - 46.0 %   MCV 88.7 78.0 - 100.0 fL   MCH 27.8 26.0 - 34.0 pg   MCHC 31.3 30.0 - 36.0 g/dL   RDW 20.9 (H) 11.5 - 15.5 %   Platelets 314 150 - 400 K/uL  Glucose, capillary     Status: Abnormal   Collection Time: 08/08/14  6:06 PM  Result Value Ref Range   Glucose-Capillary 129 (H) 70 - 99 mg/dL   Comment 1 Notify RN    Comment 2 Document in Chart   Glucose, capillary     Status: Abnormal   Collection Time: 08/09/14 12:14 AM  Result Value Ref Range   Glucose-Capillary 130 (H) 70 - 99 mg/dL  Glucose, capillary     Status: Abnormal   Collection Time: 08/09/14  5:51 AM  Result Value Ref Range   Glucose-Capillary 118 (  H) 70 - 99 mg/dL  Basic metabolic panel     Status: Abnormal   Collection Time: 08/09/14  8:27 AM  Result Value Ref Range   Sodium 125 (L) 135 - 145 mmol/L   Potassium 4.3 3.5 - 5.1 mmol/L    Comment: DELTA CHECK NOTED   Chloride 79 (L) 101 - 111 mmol/L   CO2 28 22 - 32 mmol/L   Glucose, Bld 422 (H) 70 - 99 mg/dL   BUN 151 (H) 6 - 20 mg/dL    Comment: DELTA CHECK NOTED   Creatinine, Ser 7.37 (H) 0.44 - 1.00 mg/dL   Calcium 8.4 (L) 8.9 - 10.3 mg/dL   GFR calc non Af Amer 5 (L) >60 mL/min   GFR calc Af Amer 6 (L) >60 mL/min    Comment: (NOTE) The eGFR has been calculated using the CKD EPI equation. This calculation has not been validated in all clinical situations. eGFR's persistently <60 mL/min signify possible Chronic Kidney Disease.    Anion gap 18 (H) 5 - 15  Glucose, capillary     Status: Abnormal   Collection Time: 08/09/14  8:27 AM  Result Value Ref Range   Glucose-Capillary 372 (H) 70 - 99 mg/dL  Glucose, capillary     Status: Abnormal   Collection Time:  08/09/14  8:59 AM  Result Value Ref Range   Glucose-Capillary 357 (H) 70 - 99 mg/dL  CBC     Status: Abnormal   Collection Time: 08/09/14  9:05 AM  Result Value Ref Range   WBC 13.4 (H) 4.0 - 10.5 K/uL   RBC 2.54 (L) 3.87 - 5.11 MIL/uL   Hemoglobin 7.2 (L) 12.0 - 15.0 g/dL   HCT 31.7 (L) 66.3 - 35.1 %   MCV 86.6 78.0 - 100.0 fL   MCH 28.3 26.0 - 34.0 pg   MCHC 32.7 30.0 - 36.0 g/dL   RDW 15.0 (H) 53.2 - 41.4 %   Platelets 318 150 - 400 K/uL  Basic metabolic panel     Status: Abnormal   Collection Time: 08/09/14 11:15 AM  Result Value Ref Range   Sodium 128 (L) 135 - 145 mmol/L   Potassium 3.9 3.5 - 5.1 mmol/L   Chloride 87 (L) 101 - 111 mmol/L   CO2 25 22 - 32 mmol/L   Glucose, Bld 371 (H) 70 - 99 mg/dL   BUN 753 (H) 6 - 20 mg/dL    Comment: DELTA CHECK NOTED   Creatinine, Ser 6.73 (H) 0.44 - 1.00 mg/dL   Calcium 7.3 (L) 8.9 - 10.3 mg/dL   GFR calc non Af Amer 6 (L) >60 mL/min   GFR calc Af Amer 7 (L) >60 mL/min    Comment: (NOTE) The eGFR has been calculated using the CKD EPI equation. This calculation has not been validated in all clinical situations. eGFR's persistently <60 mL/min signify possible Chronic Kidney Disease.    Anion gap 16 (H) 5 - 15  Iron and TIBC     Status: Abnormal   Collection Time: 08/09/14 11:15 AM  Result Value Ref Range   Iron 20 (L) 28 - 170 ug/dL   TIBC 614 (L) 418 - 060 ug/dL   Saturation Ratios 14 10.4 - 31.8 %   UIBC 120 ug/dL  Glucose, capillary     Status: Abnormal   Collection Time: 08/09/14 12:43 PM  Result Value Ref Range   Glucose-Capillary 42 (LL) 70 - 99 mg/dL  Glucose, capillary     Status: Abnormal  Collection Time: 08/09/14 12:47 PM  Result Value Ref Range   Glucose-Capillary 46 (L) 70 - 99 mg/dL  Glucose, capillary     Status: Abnormal   Collection Time: 08/09/14  1:30 PM  Result Value Ref Range   Glucose-Capillary 127 (H) 70 - 99 mg/dL   Comment 1 Notify RN    Comment 2 Document in Chart   Glucose, capillary      Status: Abnormal   Collection Time: 08/09/14  5:56 PM  Result Value Ref Range   Glucose-Capillary 110 (H) 70 - 99 mg/dL   Comment 1 Notify RN    Comment 2 Document in Chart   Glucose, capillary     Status: Abnormal   Collection Time: 08/09/14 11:07 PM  Result Value Ref Range   Glucose-Capillary 58 (L) 70 - 99 mg/dL   Comment 1 Notify RN   Glucose, capillary     Status: Abnormal   Collection Time: 08/09/14 11:48 PM  Result Value Ref Range   Glucose-Capillary 148 (H) 70 - 99 mg/dL  Comprehensive metabolic panel     Status: Abnormal   Collection Time: 08/10/14  4:10 AM  Result Value Ref Range   Sodium 127 (L) 135 - 145 mmol/L   Potassium 4.1 3.5 - 5.1 mmol/L   Chloride 79 (L) 101 - 111 mmol/L   CO2 26 22 - 32 mmol/L   Glucose, Bld 97 70 - 99 mg/dL   BUN 143 (H) 6 - 20 mg/dL   Creatinine, Ser 8.90 (H) 0.44 - 1.00 mg/dL   Calcium 8.2 (L) 8.9 - 10.3 mg/dL   Total Protein 6.6 6.5 - 8.1 g/dL   Albumin 1.7 (L) 3.5 - 5.0 g/dL   AST 25 15 - 41 U/L   ALT 15 14 - 54 U/L   Alkaline Phosphatase 170 (H) 38 - 126 U/L   Total Bilirubin 1.0 0.3 - 1.2 mg/dL   GFR calc non Af Amer 4 (L) >60 mL/min   GFR calc Af Amer 5 (L) >60 mL/min    Comment: (NOTE) The eGFR has been calculated using the CKD EPI equation. This calculation has not been validated in all clinical situations. eGFR's persistently <60 mL/min signify possible Chronic Kidney Disease.    Anion gap 22 (H) 5 - 15    Comment: RESULT CHECKED  Magnesium     Status: None   Collection Time: 08/10/14  4:10 AM  Result Value Ref Range   Magnesium 2.0 1.7 - 2.4 mg/dL  Phosphorus     Status: Abnormal   Collection Time: 08/10/14  4:10 AM  Result Value Ref Range   Phosphorus 8.3 (H) 2.5 - 4.6 mg/dL  CBC     Status: Abnormal   Collection Time: 08/10/14  4:10 AM  Result Value Ref Range   WBC 16.4 (H) 4.0 - 10.5 K/uL   RBC 2.74 (L) 3.87 - 5.11 MIL/uL   Hemoglobin 7.8 (L) 12.0 - 15.0 g/dL   HCT 24.1 (L) 36.0 - 46.0 %   MCV 88.0 78.0 -  100.0 fL   MCH 28.5 26.0 - 34.0 pg   MCHC 32.4 30.0 - 36.0 g/dL   RDW 20.5 (H) 11.5 - 15.5 %   Platelets 357 150 - 400 K/uL  Differential     Status: Abnormal   Collection Time: 08/10/14  4:10 AM  Result Value Ref Range   Neutrophils Relative % 73 43 - 77 %   Lymphocytes Relative 12 12 - 46 %   Monocytes Relative 11  3 - 12 %   Eosinophils Relative 3 0 - 5 %   Basophils Relative 1 0 - 1 %   Neutro Abs 11.9 (H) 1.7 - 7.7 K/uL   Lymphs Abs 2.0 0.7 - 4.0 K/uL   Monocytes Absolute 1.8 (H) 0.1 - 1.0 K/uL   Eosinophils Absolute 0.5 0.0 - 0.7 K/uL   Basophils Absolute 0.2 (H) 0.0 - 0.1 K/uL   WBC Morphology POLYCHROMASIA PRESENT   Prealbumin     Status: Abnormal   Collection Time: 08/10/14  4:10 AM  Result Value Ref Range   Prealbumin 9.4 (L) 18 - 38 mg/dL  Triglycerides     Status: None   Collection Time: 08/10/14  4:10 AM  Result Value Ref Range   Triglycerides 141 <150 mg/dL  Glucose, capillary     Status: Abnormal   Collection Time: 08/10/14  5:54 AM  Result Value Ref Range   Glucose-Capillary 101 (H) 70 - 99 mg/dL   Comment 1 Notify RN     Imaging / Studies: No results found.  Medications / Allergies:  Scheduled Meds: . antiseptic oral rinse  7 mL Mouth Rinse q12n4p  . budesonide (PULMICORT) nebulizer solution  0.25 mg Nebulization BID  . chlorhexidine  15 mL Mouth Rinse BID  . darbepoetin (ARANESP) injection - DIALYSIS  200 mcg Intravenous Q Mon-HD  . insulin aspart  0-9 Units Subcutaneous 4 times per day  . levothyroxine  12.5 mcg Intravenous Daily  . metoprolol  2.5 mg Intravenous 3 times per day  . sodium chloride  10-40 mL Intracatheter Q12H   Continuous Infusions: . Marland KitchenTPN (CLINIMIX-E) Adult 100 mL/hr at 08/09/14 1900   And  . fat emulsion 240 mL (08/09/14 1900)  . sodium chloride 10 mL/hr at 08/08/14 2000  . TPN (CLINIMIX) Adult without lytes     And  . fat emulsion     PRN Meds:.sodium chloride, sodium chloride, alteplase, bisacodyl, diphenhydrAMINE,  diphenhydrAMINE-zinc acetate, feeding supplement (NEPRO CARB STEADY), HYDROmorphone (DILAUDID) injection, levalbuterol, lidocaine (PF), lidocaine-prilocaine, ondansetron (ZOFRAN) IV, pentafluoroprop-tetrafluoroeth, sodium chloride  Antibiotics: Anti-infectives    Start     Dose/Rate Route Frequency Ordered Stop   08/04/14 1200  vancomycin (VANCOCIN) 50 mg/mL oral solution 125 mg  Status:  Discontinued     125 mg Oral 4 times per day 08/04/14 0851 08/08/14 1229   07/18/14 1800  vancomycin (VANCOCIN) 50 mg/mL oral solution 125 mg  Status:  Discontinued     125 mg Oral 4 times per day 07/18/14 1455 08/04/14 0845   07/17/14 1830  vancomycin (VANCOCIN) IVPB 750 mg/150 ml premix     750 mg 150 mL/hr over 60 Minutes Intravenous  Once 07/17/14 1818 07/18/14 0024   07/15/14 1400  metroNIDAZOLE (FLAGYL) tablet 500 mg  Status:  Discontinued     500 mg Oral 3 times per day 07/15/14 1202 07/18/14 1533   07/13/14 2200  piperacillin-tazobactam (ZOSYN) IVPB 2.25 g  Status:  Discontinued     2.25 g 100 mL/hr over 30 Minutes Intravenous 3 times per day 07/13/14 1318 07/24/14 1138   07/13/14 2000  fluconazole (DIFLUCAN) IVPB 200 mg  Status:  Discontinued     200 mg 100 mL/hr over 60 Minutes Intravenous Every 24 hours 07/13/14 1318 07/19/14 1027   07/07/14 1200  vancomycin (VANCOCIN) IVPB 1000 mg/200 mL premix  Status:  Discontinued     1,000 mg 200 mL/hr over 60 Minutes Intravenous Every 24 hours 07/07/14 0937 07/14/14 1712   07/06/14 2000  fluconazole (  DIFLUCAN) IVPB 400 mg  Status:  Discontinued     400 mg 100 mL/hr over 120 Minutes Intravenous Every 24 hours 07/06/14 1507 07/13/14 1318   07/06/14 1800  piperacillin-tazobactam (ZOSYN) IVPB 2.25 g  Status:  Discontinued     2.25 g 100 mL/hr over 30 Minutes Intravenous 4 times per day 07/06/14 1505 07/13/14 1318   07/05/14 2000  fluconazole (DIFLUCAN) IVPB 200 mg  Status:  Discontinued     200 mg 100 mL/hr over 60 Minutes Intravenous Every 24 hours  07/05/14 0757 07/06/14 1507   07/05/14 1400  piperacillin-tazobactam (ZOSYN) IVPB 2.25 g  Status:  Discontinued     2.25 g 100 mL/hr over 30 Minutes Intravenous 3 times per day 07/05/14 0749 07/06/14 1505   07/02/14 1800  vancomycin (VANCOCIN) IVPB 1000 mg/200 mL premix  Status:  Discontinued     1,000 mg 200 mL/hr over 60 Minutes Intravenous Every 24 hours 07/01/14 1858 07/05/14 0801   07/01/14 2200  piperacillin-tazobactam (ZOSYN) IVPB 3.375 g  Status:  Discontinued     3.375 g 100 mL/hr over 30 Minutes Intravenous 4 times per day 07/01/14 1858 07/05/14 0749   07/01/14 2000  fluconazole (DIFLUCAN) IVPB 400 mg  Status:  Discontinued     400 mg 100 mL/hr over 120 Minutes Intravenous Every 24 hours 07/01/14 1858 07/05/14 0757   07/01/14 1400  fluconazole (DIFLUCAN) IVPB 200 mg  Status:  Discontinued     200 mg 100 mL/hr over 60 Minutes Intravenous Every 24 hours 07/01/14 1330 07/01/14 1853   07/01/14 1000  piperacillin-tazobactam (ZOSYN) IVPB 2.25 g  Status:  Discontinued     2.25 g 100 mL/hr over 30 Minutes Intravenous Every 8 hours 07/01/14 0952 07/01/14 1853   07/01/14 1000  vancomycin (VANCOCIN) 500 mg in sodium chloride 0.9 % 100 mL IVPB     500 mg 100 mL/hr over 60 Minutes Intravenous  Once 07/01/14 0952 07/01/14 1126   06/30/14 1200  vancomycin (VANCOCIN) IVPB 1000 mg/200 mL premix     1,000 mg 200 mL/hr over 60 Minutes Intravenous  Once 06/30/14 0944 06/30/14 1403   06/30/14 1200  ceFEPIme (MAXIPIME) 2 g in dextrose 5 % 50 mL IVPB     2 g 100 mL/hr over 30 Minutes Intravenous  Once 06/30/14 0944 06/30/14 1425   06/30/14 0100  vancomycin (VANCOCIN) 2,000 mg in sodium chloride 0.9 % 500 mL IVPB     2,000 mg 250 mL/hr over 120 Minutes Intravenous  Once 06/30/14 0048 06/30/14 0525   06/30/14 0100  ceFEPIme (MAXIPIME) 2 g in dextrose 5 % 50 mL IVPB     2 g 100 mL/hr over 30 Minutes Intravenous  Once 06/30/14 0048 06/30/14 0322   06/27/14 0600  cefUROXime (ZINACEF) 1.5 g in  dextrose 5 % 50 mL IVPB     1.5 g 100 mL/hr over 30 Minutes Intravenous On call to O.R. 06/26/14 1019 06/27/14 0630   06/19/14 1800  oseltamivir (TAMIFLU) capsule 30 mg     30 mg Oral Every M-W-F (1800) 06/19/14 1003 06/22/14 1841   06/18/14 1600  oseltamivir (TAMIFLU) capsule 30 mg  Status:  Discontinued     30 mg Oral Daily 06/18/14 1538 06/19/14 1003      Assessment/Plan: POD #40 s/p Exploratory Laparotomy with graham patch closure of perforated pyloric ulcer, SBR for ischemic bowel - 07/01/2014 - Dalbert Batman Intra-abdominal fluid collection, no drain Now with duodenal fistula -CT 08/03/14 showed 3.5x5.7x3.0cm pre-rectal fluid collection. No drain was replaced in  this pelvic fluid collection after previous drain accidentally pulled out -Fistula study done and shows JP drain near graham patch with connection to the duodenal bulb. -strict NPO, refusing NGT, output has decreased, but is bilious.  Add Sandostatin.   -could consider IR placing a feeding tube past the leak -NS wet to dry dressing changes -I&Os and routine drain care VTE prophylaxis-SCDs ID--Zosyn stopped on day #24 (07/24/14).  WBC up.  May need repeat CT of abdomen to evaluate lower abdominal fluid collection C diff + New ESRD  PCM/TNA     Erby Pian, [facility] Pager [phone]) For consults and floor pages call [phone]([id number])  08/10/2014 9:58 AM

## 2014-08-10 NOTE — Progress Notes (Signed)
Hypoglycemic Event  CBG: 58  Treatment: D50 IV 50 mL  Symptoms: Nervous/irritable  Follow-up CBG: Time: 2348 CBG Result:148  Possible Reasons for Event: Unknown  Comments/MD notified: not new problem.     Ricci BarkerBales, Keron Neenan Leigh  Remember to initiate Hypoglycemia Order Set & complete

## 2014-08-10 NOTE — Progress Notes (Signed)
[facility] Progress Note   Subjective: itching last 48 hrs, phos up 3 > 8. Unable to dialyze Sunday due to emergencies  Filed Vitals:   08/10/14 0830 08/10/14 0900 08/10/14 0907 08/10/14 0930  BP: 93/49 98/65  109/58  Pulse: 115 135  137  Temp:      TempSrc:      Resp:      Height:      Weight:      SpO2:   98%    Exam: Lying flat, alert, no distress No jvd Chest clear bilat Abd RUQ drain connected to wall suction, open abd wound w packing Ext no edema Access is R IJ cath and left arm AVF +bruit Neuro is ox 3, L arm stronger than a few weeks ago  HD: new start, MWF schedule in hospital  Summary: 59 yo w CKD stage V, s/p AVF placement, smoker, HTN, obesity presented 06/17/14 w SOB/ gen weakness x 2 wks. Had worsening azotemia/ uremia/ acidosis, also H1N1 flu+. Admitted and rec'd her first HD on 06/18/14 which improved resp status. On 3/18 pt noted to be hemiparetic on the left side and CT scan showed R ACA infarct. MRI noted acute infarction throughout both hemispheres suggesting embolic phenomenon. On 3/26 she had tunneled HD cath placed due to immaturity of the AVF. On 3/30 she underwent exlap for acute abd pain/ abd tenderness w AMS. CT had showed pneumoperitoneum. Exlap showed diffuse peritonitis w perforated pyloric ulcer and ischemic area of SB in mid ileum. Underwent patch repair of perf ulcer and segmental resection of ischemic ileum. Was on CRRT from 3/30- 07/13/14 and is now back on intermittent HD.          Assessment: 1. ESRD new start MWF inpatient, using cath as AVF poor fxn 2. CVA acute w left hemiparesis 3. S/P exlap for perf pyloric ulcer/ SBR for ischemic SB 3/30 - w high output duodenal fistula 4. Abd wound - open, packing per surg 5. Nutrition - TNA / npo for fistula healing, supposed to have NG in as well to drain gastric fluids but pt pulled two of them out so not replaced at this time 6. Volume - large GI losses have balanced vol load from TNA,  was on midodrine for low BP's but is off now 7. Pelvic fluid collection - s/p IR drain, out now 8. 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 Anemia Hb dropping in to low 7's, ^darbe 100> 200/wk max dose, recheck tsat 11. 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 DM2 14. 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 Hypokalemia - due to large GI losses, pharm replacing. 4K bath w HD. Better 16. Hypomagnesemia - better 17. Hyponatremia - from dilute TNA, treat w HD today  Plan - HD today and tomorrow, get phos/ solute down, keep even    Vinson Moselleob Olga Bourbeau MD  pager [phone]    cell [phone]  08/10/2014, 10:50 AM     Recent Labs Lab 08/07/14 0805 08/08/14 0530 08/09/14 0827 08/09/14 1115 08/10/14 0410  NA  --  130* 125* 128* 127*  K  --  3.1* 4.3 3.9 4.1  CL  --  86* 79* 87* 79*  CO2  --  31 28 25 26   GLUCOSE  --  136* 422* 371* 97  BUN  --  83* 115* 104* 143*  CREATININE  --  6.06* 7.37* 6.73* 8.90*  CALCIUM  --  8.2* 8.4* 7.3* 8.2*  PHOS 2.2* 3.6  --   --  8.3*    Recent Labs Lab 08/06/14 0410 08/08/14 0530 08/10/14 0410  AST 31 27 25   ALT 18 16 15   ALKPHOS 160* 161* 170*  BILITOT 1.0 1.1 1.0  PROT 7.3 6.5 6.6  ALBUMIN 1.9* 1.7* 1.7*    Recent Labs Lab 08/08/14 1500 08/09/14 0905 08/10/14 0410  WBC 13.9* 13.4* 16.4*  NEUTROABS  --   --  11.9*  HGB 7.9* 7.2* 7.8*  HCT 25.2* 22.0* 24.1*  MCV 88.7 86.6 88.0  PLT 314 318 357   . antiseptic oral rinse  7 mL Mouth Rinse q12n4p  . budesonide (PULMICORT) nebulizer solution  0.25 mg Nebulization BID  . chlorhexidine  15 mL Mouth Rinse BID  . darbepoetin (ARANESP) injection - DIALYSIS  200 mcg Intravenous Q Mon-HD  . insulin aspart  0-9 Units Subcutaneous 4 times per day  . levothyroxine  12.5 mcg Intravenous Daily  . metoprolol  2.5 mg Intravenous 3 times per day  . octreotide  (SANDOSTATIN)    IV infusion  50 mcg/hr Intravenous 3 times per day  . sodium chloride  10-40 mL Intracatheter  Q12H   . Marland Kitchen.TPN (CLINIMIX-E) Adult 100 mL/hr at 08/09/14 1900   And  . fat emulsion 240 mL (08/09/14 1900)  . sodium chloride 10 mL/hr at 08/08/14 2000  . TPN (CLINIMIX) Adult without lytes     And  . fat emulsion     sodium chloride, sodium chloride, alteplase, bisacodyl, diphenhydrAMINE, diphenhydrAMINE-zinc acetate, feeding supplement (NEPRO CARB STEADY), HYDROmorphone (DILAUDID) injection, levalbuterol, lidocaine (PF), lidocaine-prilocaine, ondansetron (ZOFRAN) IV, pentafluoroprop-tetrafluoroeth, sodium chloride

## 2014-08-10 NOTE — Progress Notes (Signed)
Progress Note [facility].   Gagandeep C Maxfield EAV:[id number] DOB: May 28, 1955 DOA: 06/17/2014 PCP: Willey BladeEAN, [name], MD  Admit HPI / Brief Narrative59: 58 yo female smoker with hx HTN, ESRD on HD (just began this admit), COPD, failed L AV fistula, initially admitted 3/16 with flu and acute hypoxic resp failure. Ultimately tx to [facility] due to need for HD. 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   Patient on 3/31 was taking to the OR for ex lap for per pyloric ulcer and sm bowel resection. Patient develops septic shock on 4-1, she was started on pressors and wean off pressors on 4-3. Subsequently she was diagnosed with pelvic abscess by CT scan perform on 4-7. She underwent pelvic drain placement by IR. Patient also develops C. diff diagnosed 4-12.  C diff was not responding flagyl, and she was started on vancomycin 4-16. She also develops  run of SVT. Cardiology has been helping with management. Course complicated with high RV pressure and concerns for PE; . CT angio negative for PE. Also with fistula formation between anastomosis and blake drain, making her back to NPO state and in need of TPN. Family want everything to be done. Palliative care consulted and following. Currently Full code. Patient on 5-2 develops hypoxemia, hypotension and SVT during dialysis treatment. Dialysis was stop. She received IV bolus. She was started on BIPAP. She was transfer to Step down unit. CCM and cardiology re consulted.Her Hr decrease, BP improved. She was taken off BIPAP. Now stable, on TNA, off Abx  HPI/Subjective: Seen on HD, complains of dyspnea, continues to feel miserable, itching, in pain, drain intermittently with blood  Assessment/Plan: Acute hypoxic Respiratory Failure:  -2nd to H1N1, hypervolemia, HCAP >> resolved.  -also has COPD and PAH -improving, now weaned off O2 -Nebulizer PRN and CPAP QHS   Septic shock  -due to perforated prepyloric ulcer, requiring pressors -resolved  C.  difficile colitis -C diff positive on 4-12, Received 4 days of flagyl without improvement, then started on PO vanc -Completed 3weeks of Oral Vanc-stopped 5/7, this was 1 week after IV Zosyn was stopped, stopped 5/5 -Diarrhea  resolved  Status post exploratory laparotomy with closure of perforated pyloric ulcer/abdominal abscess / small bowel resection w/ postop ileus and now with fistula -Pelvic abscess s/p percutaneous drain placement 4/8; now with leakage from anastomosis site -Received  Zosyn for 23 days. IV antibiotics discontinued by Surgery on 4/22, also Received 18 days of Vancomycin and fluconazole.  -Due to Fistula is back to NPO state, TPN.  -Fistula study done shows JP drain connection to the duodenal bulb 5-02. repeat CT abdomen with  3.0 x 3.5 x 5.7 cm collection in the pelvic cul-de-sac and extraluminal air alongside JP drain in anterior abd wall -Per [facility], plan of repeat CT next week -Drain with old and new blood, Hb trending down, monitor daily  Hypokalemia;  -replace in TNA  PSVT/concerns for pulmonary embolism given extremely high heart right side pressure -Has been evaluated by EP, no further episodes for 48h -Cardiology following peripherally- follow K+ and Mg  -Continue IV metoprolol, now off amiodarone;  -V-Q scan with intermediate probability for pulmonary embolism.  -CTA 4/27, negative for PE   Small PFO Has been evaluated by Cardiology  ACA CVA with Lt sided weakness -TEE ;normal LV function; no LAA thrombus -Started on ASA 81mg  per NEuro, now NPO -Will need ongoing long term rehab - PT/OT to cont to follow while inpatient -Patient  remains very weak and deconditioned .   ESRD new start HD this admit  AKi due to sepsis, shock Ongoing hemodialysis per Nephrology - perm-cath placed 3/26 d/t AVF problems Patient to have AV fistula reviewed at some point for permanent access (most likely in outpatient setting) .  Patient will need to be able to sit up for  Outpatient dialysis. At this point still unable to sit up for HD -she was unable to complete dialysis treatment 5-2 due to hypotension, SVT.   Chronic diastolic congestive heart failure w/ Severe RHF EF 60-65%, grade 1 diastolic dysfunction. Appears compensated.  Fluids/volume managed with hemodialysis.  Elevated PA pressures on 2-D echo; CTA negative for PE>   Anemia of critical illness/chronic disease -and bleeding from leak/fistula -s/p 2U PRBC 4/13  -Hb was trending down Transfuse if <7 Aranesp/iron per renal  Thrombocytopenia; resolved.  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 On TPN now, CBGs in 120-160s Hbaic 6.5  Abnormal TSH -Free T4 WNL, T3 1.8 -most likely sick thyroid with ongoing infection and body stress; also due to amiodarone use. TSH in 7 range -continue low dose synthroid given PSVT, now on IV synthroid   L Renal mass  -noted on US, Indeterminate 1.9 cm hypoechoic mass off the interpolar region of the left kidney -may potentially represent a cyst however solid mass is not excluded -needs  Outpatient FU of this  Obesity - Inadequate oral intake related to altered GI function as evidenced by limited intake, ongoing.  Body mass index is 31.33 kg/(m^2). -patient weight trending down -patient unable to tolerate diet and with leaking fistula  Code Status: FULL Family Communication: discussed with patient.  Disposition Plan: VERY POOR PROGNOSIS, UNREALISTIC EXPECTATIONS, GIVEN AKI/ESRD, ONGOING SURGICAL ISSUES, NPO, PALLIATIVE FOLLOWING, wants to continue aggressive course including FULL CODE  Consultants: Cardiology Nephrology PCCM Gen Surgery  Palliative care  Significant Events: 3/16 influenza A+ 3/18 R ACA stroke 3/19  MRI multifocal acute infarction of both hemispheres, large right distal ACA 3/22 TEE severe RAE, severely reduced RV function, severe TR, no LAA thrombus, small PFO on TEE 3/31 to OR ex lap for per pyloric ulcer and sm bowel resection 4/1 septic shock on pressors 4/3 pressors weaned off  4/4 restart pressors, CRRT 4/6 levo switched to neo 4/7 pelvic abscess on CT 4/8 IR placed pelvic drain 4/9 off pressors 4/11 long run of SVT. 4/23 VQ scan which demonstrated intermediate probability for PE 4/24 CT angiogram of her chest: Pending/unable to be done due to lack of access   Antibiotics: Zosyn >stopped on 4/22 (received a total of 23 days of treatment ) Vancomycin 4-16  DVT prophylaxis: SCDs  Objective: Blood pressure 101/64, pulse 118, temperature 97.3 F (36.3 C), temperature source Oral, resp. rate 21, height 5\' 6"  (1.676 m), weight 88 kg (194 lb 0.1 oz), SpO2 98 %.  Intake/Output Summary (Last 24 hours) at 08/10/14 1400 Last data filed at 08/10/14 1109  Gross per 24 hour  Intake   1430 ml  Output    650 ml  Net    780 ml   Exam: General: AAOx3, chronically ill appearing Lungs: poor air movement Cardiovascular: Regular rate, no murmurs and no gallops   Abdomen: Abdominal wound dressing, clean and dry; abdomen slightly distended, drain intact and with /  green drainage; mild tenderness   Extremities: no edema   Data Reviewed: Basic Metabolic Panel:  Recent Labs Lab 08/04/14 0754 08/05/14 0400 08/06/14 0410  08/07/14 0420 08/07/14 0805 08/08/14 0530 08/09/14 0827 08/09/14 1115 08/10/14 0410  NA 131* 130*  130* 135  --  129*  --  130* 125* 128* 127*  K 3.4* 3.4*  3.3* 3.3*  < > 3.1*  --  3.1* 4.3 3.9 4.1  CL 91* 88*  88* 93*  --  84*  --  86* 79* 87* 79*  CO2 --  29  --  GLUCOSE 137* 156*  156* 134*  --  134*  --  136* 422* 371* 97  BUN 53* 81*  80* 41*  --  79*  --  83* 115* 104* 143*  CREATININE 5.73* 7.27*  7.28* 4.18*  --   6.05*  --  6.06* 7.37* 6.73* 8.90*  CALCIUM 8.4* 8.4*  8.4* 8.6*  --  8.7*  --  8.2* 8.4* 7.3* 8.2*  MG 1.4* 1.8 2.0  --   --   --   --   --   --  2.0  PHOS 2.0* 4.4  4.4 1.8*  --   --  2.2* 3.6  --   --  8.3*  < > = values in this interval not displayed.  Liver Function Tests:  Recent Labs Lab 08/04/14 0754 08/05/14 0400 08/06/14 0410 08/08/14 0530 08/10/14 0410  AST  --   --  ALT  --   --  ALKPHOS  --   --  160* 161* 170*  BILITOT  --   --  1.0 1.1 1.0  PROT  --   --  7.3 6.5 6.6  ALBUMIN 2.1* 2.0* 1.9* 1.7* 1.7*   CBC:  Recent Labs Lab 08/07/14 0420 08/08/14 0530 08/08/14 1500 08/09/14 0905 08/10/14 0410  WBC 15.5* 13.8* 13.9* 13.4* 16.4*  NEUTROABS  --   --   --   --  11.9*  HGB 9.3* 8.1* 7.9* 7.2* 7.8*  HCT 28.8* 25.4* 25.2* 22.0* 24.1*  MCV 86.7 87.6 88.7 86.6 88.0  PLT 312 308 314 318 357    CBG:  Recent Labs Lab 08/09/14 1756 08/09/14 2307 08/09/14 2348 08/10/14 0554 08/10/14 1144  GLUCAP 110* 58* 148* 101* 110*    Scheduled Meds:  Scheduled Meds: . antiseptic oral rinse  7 mL Mouth Rinse q12n4p  . budesonide (PULMICORT) nebulizer solution  0.25 mg Nebulization BID  . chlorhexidine  15 mL Mouth Rinse BID  . darbepoetin (ARANESP) injection - DIALYSIS  200 mcg Intravenous Q Mon-HD  . insulin aspart  0-9 Units Subcutaneous 4 times per day  . levothyroxine  12.5 mcg Intravenous Daily  . metoprolol  2.5 mg Intravenous 3 times per day  . octreotide  (SANDOSTATIN)    IV infusion  50 mcg/hr Intravenous 3 times per day  . sodium chloride  10-40 mL Intracatheter Q12H    Time spent on care of this patient: 35 minutes   Zannie Cove , MD   [phone] [facility] Office  [phone]   If 7PM-7AM, please contact night-coverage [email or web address] Password [email or web address] 08/10/2014, 2:00 PM   LOS: 54 days

## 2014-08-10 NOTE — Progress Notes (Signed)
Subjective: She reports feeling very SOB for a few minutes at HD  Objective: Vital signs in last 24 hours: Temp:  [97.3 F (36.3 C)-98.2 F (36.8 C)] 97.3 F (36.3 C) (05/09 1109) Pulse Rate:  [80-137] 118 (05/09 1109) Resp:  [13-30] 24 (05/09 1109) BP: (81-121)/(35-92) 90/50 mmHg (05/09 1109) SpO2:  [94 %-100 %] 98 % (05/09 0907) Weight:  [194 lb 0.1 oz (88 kg)-195 lb 8.8 oz (88.7 kg)] 194 lb 0.1 oz (88 kg) (05/09 0659) Last BM Date: 07/31/14  Intake/Output from previous day: 05/08 0701 - 05/09 0700 In: 1430 [TPN:1430] Out: 950 [Drains:950] Intake/Output this shift:    Medications Scheduled Meds: . antiseptic oral rinse  7 mL Mouth Rinse q12n4p  . budesonide (PULMICORT) nebulizer solution  0.25 mg Nebulization BID  . chlorhexidine  15 mL Mouth Rinse BID  . darbepoetin (ARANESP) injection - DIALYSIS  200 mcg Intravenous Q Mon-HD  . insulin aspart  0-9 Units Subcutaneous 4 times per day  . levothyroxine  12.5 mcg Intravenous Daily  . metoprolol  2.5 mg Intravenous 3 times per day  . octreotide  (SANDOSTATIN)    IV infusion  50 mcg/hr Intravenous 3 times per day  . sodium chloride  10-40 mL Intracatheter Q12H   Continuous Infusions: . Marland Kitchen.TPN (CLINIMIX-E) Adult 100 mL/hr at 08/09/14 1900   And  . fat emulsion 240 mL (08/09/14 1900)  . sodium chloride 10 mL/hr at 08/08/14 2000  . TPN (CLINIMIX) Adult without lytes     And  . fat emulsion     PRN Meds:.bisacodyl, diphenhydrAMINE, diphenhydrAMINE-zinc acetate, HYDROmorphone (DILAUDID) injection, levalbuterol, ondansetron (ZOFRAN) IV, sodium chloride  PE: General appearance: alert, cooperative and no distress Lungs: Diffuse wheezing. Heart: Reg rhythm.  Rate fast.  No MM Abdomen: +BS Extremities: No LEE Pulses: 2+ and symmetric Skin: Warm and dry  Lab Results:   Recent Labs  08/08/14 1500 08/09/14 0905 08/10/14 0410  WBC 13.9* 13.4* 16.4*  HGB 7.9* 7.2* 7.8*  HCT 25.2* 22.0* 24.1*  PLT 314 318 357    BMET  Recent Labs  08/09/14 0827 08/09/14 1115 08/10/14 0410  NA 125* 128* 127*  K 4.3 3.9 4.1  CL 79* 87* 79*  CO2 28 25 26   GLUCOSE 422* 371* 97  BUN 115* 104* 143*  CREATININE 7.37* 6.73* 8.90*  CALCIUM 8.4* 7.3* 8.2*    Assessment/Plan  Principal Problem:   Acute respiratory failure with hypoxia Active Problems:   Cerebral thrombosis with cerebral infarction   Hypertension   ESRD needing dialysis   Obesity (BMI 30-39.9)   Depression   Left renal mass   Chronic diastolic heart failure   H1N1 influenza   Tobacco use disorder   Renal failure (ARF), acute on chronic   Hyperlipidemia   PSVT (paroxysmal supraventricular tachycardia)   SOB (shortness of breath)   Perforated gastric ulcer   Abnormal TSH   History of intermediate V/Q scan   HIT (heparin-induced thrombocytopenia)   Fistula   Abdominal pain   Palliative care encounter   ESRD (end stage renal disease)   SVT (supraventricular tachycardia)   Right heart failure  PSVT  She has several episodes this morning between 8:15 and 10:15, while at HD, but none in the 16 hours before. Rate was 136.  All spontaneously ended.  Tele history will not go back any further.  She has IV lopressor 2.5mg  Q8 however, BP is low so she is not getting it regularly.  Mag 2.0 and K 4.1.  Didn't get HD yesterday but is supposed to get tomorrow also.  She is in sinus tach now.  Hgb 7.8.  Negative PE.  Echo, 4/21:  Normal LV systolic function, impaired relaxation with normal filling pressures.Severely dilated right sided chamber and severe pulmonary hypertension consistent with cor pulmonale.  CT negative for PE on 4/27. Very wheezy on exam.  She has Xopenenex    LOS: 54 days    HAGER, BRYAN PA-C 08/10/2014 11:42 AM  Attending Note:   The patient was seen and examined.  Agree with assessment and plan as noted above.  Changes made to the above note as needed.  SVT - back in NSR now Continue meds for now Would restart  amiodarone i SVT worsens   Vesta MixerPhilip J. Nare Gaspari, Montez HagemanJr., MD, [facility] Fall[facility]ACC 08/10/2014, 1:42 PM [address],  [address] Office [phone] Pager [phone]

## 2014-08-10 NOTE — Progress Notes (Signed)
PARENTERAL NUTRITION CONSULT NOTE - FOLLOW UP  Pharmacy Consult for TPN Indication:  High output Duodenal Fistula  No Known Allergies  Patient Measurements: Height: _0  (167.6 cm) Weight: 194 lb 0.1 oz (88 kg) IBW/kg (Calculated) : 59.3 Adjusted Body Weight: 71.2kg  Vital Signs: Temp: 97.6 F (36.4 C) (05/09 0659) Temp Source: Oral (05/09 0659) BP: 86/35 mmHg (05/09 0800) Pulse Rate: 107 (05/09 0800) Intake/Output from previous day: 05/08 0701 - 05/09 0700 In: 1430 [TPN:1430] Out: 950 [Drains:950]  Labs:  Recent Labs  08/08/14 1500 08/09/14 0905 08/10/14 0410  WBC 13.9* 13.4* 16.4*  HGB 7.9* 7.2* 7.8*  HCT 25.2* 22.0* 24.1*  PLT 314 318 357    Recent Labs  08/08/14 0530 08/09/14 0827 08/09/14 1115 08/10/14 0410  NA 130* 125* 128* 127*  K 3.1* 4.3 3.9 4.1  CL 86* 79* 87* 79*  CO2 _1 GLUCOSE 136* 422* 371* 97  BUN 83* 115* 104* 143*  CREATININE 6.06* 7.37* 6.73* 8.90*  CALCIUM 8.2* 8.4* 7.3* 8.2*  MG  --   --   --  2.0  PHOS 3.6  --   --  8.3*  PROT 6.5  --   --  6.6  ALBUMIN 1.7*  --   --  1.7*  AST 27  --   --  25  ALT 16  --   --  15  ALKPHOS 161*  --   --  170*  BILITOT 1.1  --   --  1.0  PREALBUMIN  --   --   --  9.4*  TRIG  --   --   --  141   Estimated Creatinine Clearance: 7.7 mL/min (by C-G formula based on Cr of 8.9).   Recent Labs  08/09/14 1756 08/09/14 2348 08/10/14 0554  GLUCAP 110* 148* 101*   Insulin Requirements in the past 24 hours:  10 units Novolog SSI, 2 hypoglycemic events requiring D50  Current Nutrition:  NPO Clinimix 5/15 (with electrolytes) at 158m/hr and 20% IVFE at 174mhr which will provide 1843 kcal and 96 gm protein.  Nutritional Goals: (per RD note 4/28) 2100-2300 kCal, 120-140 grams of protein per day Goal: Clinimix 5/15 at 10061mr + 20% IVFE 1m65m to provide 2184 kcal and 120 gm protein  Assessment: 58 Y50 with complicated hospital course. S/p closure perforated pyloric ulcer, SBR for  ischemic necrosis, diffuse peritonitis on 07/01/14. Pharmacy consulted to provide TPN 4/1-4/15 for nutrition support for prolonged ileus. Pt then transitioned to po diet + supplements. 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  GI: Duodenal fistula. NPO. Open abd wound, (+)abd pain. Drain output changed from bile to bright red blood overnight on 5/7, now bilious. Drain output 2200 5/7 and 950 cc 9/8.  Albumin 1.7. Prealbumin remains stable and low at 9.4 Noted palliative care discussions ongoing but for now pt/family wish for aggressive care. NGT out and pt refuses replacement.  She had  *TPN stopped 5/2 at 1440 during rapid response event per Dr ScheJonnie Finneruest due to volume overload. Now TPN running at 100 ml/hr and lipids at 10 ml/hr.  Endo: h/o DM. had low CBGs prior to TPN start. Got 10 units SSI, had 2 hypoglycemic events requiring D50.   Lytes: lytes to TPN Saturday 5/7-  K+4.1(AFib, goal >= 4)  Current orders for 4K HD bath, in HD now - Dr. ScheJonnie Finnerts pharmacy to manage K+. Na low at 127 from dilute  TPN.  To ge max Na in HD bath of 145.  Ca 8.2, CoCa 10.04, Phos 8.3, CaxPhos = 83.3, > than 55. Mag 2 - within goal for afib.  Renal: New ESRD - started 3/17. HD on M/W/F. Aranesp 100 qMon (decr from 150 to 100 on 5/2 2nd Hg 03/05/09.7). Hg 7.8 to day, aranesp dose increased to 200 5/9  Pulm: asthma, COPD. RA. CT negative for PE. CCM signed off 5/3  Cards: HTN. (afib). Cards signed off 4/26. Midodrine and amio on hold (no NGT and NPO) IV metoprolol. CHADS-VASc =4 (not good candidate for [facility]).  Hepatotobil: Alk phos slightly elevated with upward trend, other LFTs wnl. TG 141 (5/9)  Neuro: hx depression / insomnia - new stroke noted on 3/18.   ID: completed PO Vanc D#14/14on 5/7  for cdiff colitis, Diarrhea is better. WBC up to 16.3 on 5/4  Best Practices: SCDs, PPI IV  TPN Access: PICC line in IR 4/27 TPN start date: 4/1->4/15; restart  4/27>>  Plan:  -  Change back to Clinimix 5/15 without electrolytes at 100/hr and 20% IVFE at 60m/hr.  Lytes taken out due to elevated CaxPhos product -   Trace elements and MVI daily in TPN.  -  Continue sensitive SSI q6h - will check renal panel and mag in am  MEudelia Bunch Pharm.D. [id number]/12/2014 8:44 AM

## 2014-08-10 NOTE — Progress Notes (Signed)
Utilization review completed.  

## 2014-08-10 NOTE — Progress Notes (Signed)
Palliative care follow up. Interval history: patient moved to step down unit. No major medical changes. Continues on aggressive care including HD. May 2 schedule pain med ordered but all d/c's. Reviewed record: getting diluadid 0.2 mg twice a day.  [name] reports that her pain is adequately controlled. Her main c/o is thirst and desire for sips and chips. Explained that need for being NPO while being aggressively treated for JP-enteric leak. She is appropriately depressed despite medication.  Filed Vitals:   08/10/14 1158  BP: 101/64  Pulse:   Temp:   Resp: 21   Gen'l- elderly black woman in no acute distress HEENT- no xerostomia/cracked lips Cor- RRR Pulm - normal respirations Abd- dry dressing over abdomin. JP drain full  Plan - patient wishes to continue present course of treatment.  She is aware of potential for choices in regard to treatment but does not wish to discuss EoL issues  Will continue supportive visits.   Illene RegulusMichael Norins, MD Palliative Care Team [phone]

## 2014-08-11 DIAGNOSIS — L988 Other specified disorders of the skin and subcutaneous tissue: Secondary | ICD-10-CM

## 2014-08-11 LAB — VITAMIN D 25 HYDROXY (VIT D DEFICIENCY, FRACTURES): Vit D, 25-Hydroxy: 8.1 ng/mL — ABNORMAL LOW (ref 30.0–100.0)

## 2014-08-11 LAB — GLUCOSE, CAPILLARY
Glucose-Capillary: 158 mg/dL — ABNORMAL HIGH (ref 70–99)
Glucose-Capillary: >600 mg/dL (ref 70–99)
Glucose-Capillary: 80 mg/dL (ref 70–99)
Glucose-Capillary: <10 mg/dL — CL (ref 70–99)
Glucose-Capillary: 57 mg/dL — ABNORMAL LOW (ref 70–99)
Glucose-Capillary: 138 mg/dL — ABNORMAL HIGH (ref 70–99)
Glucose-Capillary: 196 mg/dL — ABNORMAL HIGH (ref 70–99)

## 2014-08-11 LAB — RENAL FUNCTION PANEL
ALBUMIN: 1.6 g/dL — AB (ref 3.5–5.0)
Anion gap: 8 (ref 5–15)
BUN: 54 mg/dL — AB (ref 6–20)
CO2: 28 mmol/L (ref 22–32)
CREATININE: 4.4 mg/dL — AB (ref 0.44–1.00)
Calcium: 8 mg/dL — ABNORMAL LOW (ref 8.9–10.3)
Chloride: 95 mmol/L — ABNORMAL LOW (ref 101–111)
GFR calc Af Amer: 12 mL/min — ABNORMAL LOW (ref >60)
GFR calc non Af Amer: 10 mL/min — ABNORMAL LOW (ref >60)
Glucose, Bld: 166 mg/dL — ABNORMAL HIGH (ref 70–99)
POTASSIUM: 3.8 mmol/L (ref 3.5–5.1)
Phosphorus: 4.6 mg/dL (ref 2.5–4.6)
Sodium: 131 mmol/L — ABNORMAL LOW (ref 135–145)

## 2014-08-11 LAB — CBC
HEMATOCRIT: 22.3 % — AB (ref 36.0–46.0)
Hemoglobin: 6.8 g/dL — CL (ref 12.0–15.0)
MCH: 28.5 pg (ref 26.0–34.0)
MCHC: 30.5 g/dL (ref 30.0–36.0)
MCV: 93.3 fL (ref 78.0–100.0)
Platelets: 302 10*3/uL (ref 150–400)
RBC: 2.39 MIL/uL — ABNORMAL LOW (ref 3.87–5.11)
RDW: 21.3 % — AB (ref 11.5–15.5)
WBC: 13.6 10*3/uL — AB (ref 4.0–10.5)

## 2014-08-11 LAB — BASIC METABOLIC PANEL
ANION GAP: 10 (ref 5–15)
BUN: 11 mg/dL (ref 6–20)
CHLORIDE: 92 mmol/L — AB (ref 101–111)
CO2: 25 mmol/L (ref 22–32)
CREATININE: 1.46 mg/dL — AB (ref 0.44–1.00)
Calcium: 7 mg/dL — ABNORMAL LOW (ref 8.9–10.3)
GFR calc non Af Amer: 39 mL/min — ABNORMAL LOW (ref >60)
GFR, EST AFRICAN AMERICAN: 45 mL/min — AB (ref >60)
Glucose, Bld: 733 mg/dL (ref 70–99)
POTASSIUM: 3.9 mmol/L (ref 3.5–5.1)
Sodium: 127 mmol/L — ABNORMAL LOW (ref 135–145)

## 2014-08-11 LAB — PREPARE RBC (CROSSMATCH)

## 2014-08-11 LAB — MAGNESIUM: MAGNESIUM: 2 mg/dL (ref 1.7–2.4)

## 2014-08-11 MED ORDER — PENTAFLUOROPROP-TETRAFLUOROETH EX AERO: 1.0000 "application " | INHALATION_SPRAY | CUTANEOUS | Status: DC | PRN

## 2014-08-11 MED ORDER — SODIUM CHLORIDE 0.9 % IV BOLUS (SEPSIS)
250.0000 mL | Freq: Once | INTRAVENOUS | Status: AC
Start: 2014-08-11 — End: 2014-08-11
  Administered 2014-08-11: 250 mL via INTRAVENOUS

## 2014-08-11 MED ORDER — DEXTROSE 50 % IV SOLN
INTRAVENOUS | Status: AC
  Administered 2014-08-11: 50 mL
  Filled 2014-08-11: qty 50

## 2014-08-11 MED ORDER — LIDOCAINE HCL (PF) 1 % IJ SOLN: 5.0000 mL | INTRAMUSCULAR | Status: DC | PRN

## 2014-08-11 MED ORDER — CLINIMIX/DEXTROSE (5/15) 5 % IV SOLN
INTRAVENOUS | Status: AC
  Administered 2014-08-11: 18:00:00 via INTRAVENOUS

## 2014-08-11 MED ORDER — SODIUM CHLORIDE 0.9 % IV SOLN: 100.0000 mL | INTRAVENOUS | Status: DC | PRN

## 2014-08-11 MED ORDER — SODIUM CHLORIDE 0.9 % IV SOLN: Freq: Once | INTRAVENOUS | Status: DC

## 2014-08-11 MED ORDER — ALTEPLASE 2 MG IJ SOLR
2.0000 mg | Freq: Once | INTRAMUSCULAR | Status: DC | PRN
  Filled 2014-08-11: qty 2

## 2014-08-11 MED ORDER — HYDROMORPHONE HCL 1 MG/ML IJ SOLN
INTRAMUSCULAR | Status: AC
  Filled 2014-08-11: qty 1

## 2014-08-11 MED ORDER — DEXTROSE 50 % IV SOLN
INTRAVENOUS | Status: AC
  Filled 2014-08-11: qty 50

## 2014-08-11 MED ORDER — SODIUM CHLORIDE 0.9 % IV SOLN
50.0000 ug/h | INTRAVENOUS | Status: DC
  Administered 2014-08-11 – 2014-08-23 (×22): 50 ug/h via INTRAVENOUS
  Filled 2014-08-11 (×53): qty 1

## 2014-08-11 MED ORDER — SODIUM CHLORIDE 0.9 % IV SOLN
125.0000 mg | INTRAVENOUS | Status: DC
  Filled 2014-08-11 (×2): qty 10

## 2014-08-11 MED ORDER — NEPRO/CARBSTEADY PO LIQD
237.0000 mL | ORAL | Status: DC | PRN
  Filled 2014-08-11: qty 237

## 2014-08-11 MED ORDER — FAT EMULSION 20 % IV EMUL
240.0000 mL | INTRAVENOUS | Status: AC
  Administered 2014-08-11: 240 mL via INTRAVENOUS

## 2014-08-11 MED ORDER — HEPARIN SODIUM (PORCINE) 1000 UNIT/ML DIALYSIS: 1000.0000 [IU] | INTRAMUSCULAR | Status: DC | PRN

## 2014-08-11 MED ORDER — DEXTROSE 50 % IV SOLN
50.0000 mL | Freq: Once | INTRAVENOUS | Status: AC
  Administered 2014-08-11: 50 mL via INTRAVENOUS

## 2014-08-11 MED ORDER — LIDOCAINE-PRILOCAINE 2.5-2.5 % EX CREA: 1.0000 "application " | TOPICAL_CREAM | CUTANEOUS | Status: DC | PRN

## 2014-08-11 MED ORDER — INSULIN ASPART 100 UNIT/ML ~~LOC~~ SOLN
15.0000 [IU] | Freq: Once | SUBCUTANEOUS | Status: AC
  Administered 2014-08-11: 15 [IU] via SUBCUTANEOUS

## 2014-08-11 NOTE — Progress Notes (Signed)
Hypoglycemic Event  CBG: 56  Treatment: D50 IV 50 mL  Symptoms: Shaky  Follow-up CBG: Time 1825           CBG Result:  Possible Reasons for Event: Unknown  Comments/MD notified: MD Zannie CovePreetha Joseph notified and aware.     Ebone Alcivar, Lurena JoinerRebecca A  Remember to initiate Hypoglycemia Order Set & complete

## 2014-08-11 NOTE — Progress Notes (Signed)
OT cancellation    08/11/14 0859  OT Visit Information  Last OT Received On 08/11/14  Reason Eval/Treat Not Completed Patient at procedure or test/ unavailable. Pt in HD.   Jenell MillinerLindsey Millianna Szymborski, OTR/L [phone]

## 2014-08-11 NOTE — Progress Notes (Signed)
Progress Note [facility].   Laine C Robertshaw ZOX:[id number] DOB: 01-13-1956 DOA: 06/17/2014 PCP: [name], MD  Admit HPI / Brief Narrative: 60 yo female smoker with hx HTN, COPD, CKD4, failed L AV fistula, initially admitted 3/16 with flu and acute hypoxic resp failure. Ultimately tx to [facility] due to need for HD. 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  Patient on 3/31 was taken to the OR for ex lap for perforated pyloric ulcer with diffuse peritonitis, ischemic necrosis of mid ileum. Patient developed septic shock on 4-1, she was started on pressors and wean off pressors on 4-3.  Subsequently she was diagnosed with pelvic abscess by CT scan perform on 4-7. She underwent pelvic drain placement by IR. Patient also develops C. diff diagnosed 4-12.  C diff was not responding flagyl, and she was started on vancomycin 4-16. She also develops  run of SVT. Cardiology has been helping with management. Course complicated with high RV pressure ,CT angio negative for PE. - Also with high output fistula formation between anastomosis and drain, making her back to NPO state and in need of TPN. Family want everything to be done. Palliative care consulted and following. Currently Full code. Patient on 5-2 develops hypoxemia, hypotension and SVT during dialysis treatment. Dialysis was stop. She received IV bolus. She was started on BIPAP. She was transfer to Step down unit. CCM and cardiology re consulted.Her Hr decreased, BP improved. She was taken off BIPAP. Now stable, on TNA, off all Abx, continues to have very high output from fistula/leak per [facility] Palliative following as well  HPI/Subjective: Seen on HD this am, in better spirits today, reports mild abd pain , drain intermittently with blood, itching   Assessment/Plan: Acute hypoxic Respiratory Failure:  -2nd to H1N1, hypervolemia, HCAP >> resolved.  -also has COPD and PAH -improving, now weaned off O2 -Nebulizer PRN and CPAP  QHS   Septic shock  -due to perforated prepyloric ulcer, requiring pressors -resolved  C. difficile colitis -C diff positive on 4-12, Received 4 days of flagyl without improvement, then started on PO vanc -Completed 3weeks of Oral Vanc-stopped 5/7, this was 1 week after IV Zosyn was stopped 5/5 -Diarrhea  resolved  Status post exploratory laparotomy with closure of perforated pyloric ulcer/abdominal abscess / small bowel resection w/ postop ileus and now with fistula -Pelvic abscess s/p percutaneous drain placement 4/8; now with leakage from anastomosis site -Received  Zosyn for 23 days. IV antibiotics discontinued by Surgery on 4/22, also Received 18 days of Vancomycin and fluconazole.  -Due to Fistula is back to NPO state, TPN.  -Fistula study done shows JP drain connection to the duodenal bulb 5-02. repeat CT abdomen with  3.0 x 3.5 x 5.7 cm collection in the pelvic cul-de-sac and extraluminal air alongside JP drain in anterior abd wall -Per CCS, plan of repeat CT next week -Drain with old and new blood, Hb trending down, transfuse 1 unit today -continues to have high output from Fistula/leak  Hypokalemia;  -replace in TNA  PSVT/concerns for pulmonary embolism given extremely high heart right side pressure -Has been evaluated by EP, no further episodes for 48h -Cardiology following peripherally- follow K+ and Mg  -Continue IV metoprolol, now off amiodarone;  -V-Q scan with intermediate probability for pulmonary embolism.  -CTA 4/27, negative for PE   Small PFO Has been evaluated by Cardiology  ACA CVA with Lt sided weakness -TEE ;normal LV function; no LAA thrombus -Started on  ASA  per NEuro, now NPO -Will need ongoing long term rehab - PT/OT to cont to follow while inpatient -Patient remains very weak and deconditioned .   ESRD new start HD this admit  AKi on CKD 4, due to sepsis, shock Ongoing hemodialysis per Nephrology - perm-cath placed 3/26 d/t AVF  problems Patient to have AV fistula reviewed at some point for permanent access (most likely in outpatient setting) .  Patient will need to be able to sit up for Outpatient dialysis. At this point still unable to sit up for HD -she was unable to complete dialysis treatment 5-2 due to hypotension, SVT, stable since.   Chronic diastolic congestive heart failure w/ Severe RHF EF 60-65%, grade 1 diastolic dysfunction. Appears compensated.  Fluids/volume managed with hemodialysis.  Elevated PA pressures on 2-D echo; CTA negative for PE  Anemia of critical illness/chronic disease -and bleeding from leak/fistula -s/p 2U PRBC 4/13  -Hb was trending down 6.8 today Transfuse today 5/10 Aranesp/iron per renal  Thrombocytopenia; resolved.  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 On TPN now, CBGs in 120-160s Hbaic 6.5  Abnormal TSH -Free T4 WNL, T3 1.8 -most likely sick thyroid with ongoing infection and body stress; also due to amiodarone use. TSH in 7 range -continue low dose synthroid given PSVT, now on IV synthroid   L Renal mass  -noted on [place], Indeterminate 1.9 cm hypoechoic mass off the interpolar region of the left kidney -may potentially represent a cyst however solid mass is not excluded -needs  Outpatient FU of this  Obesity - Inadequate oral intake related to altered GI function as evidenced by limited intake, ongoing.  Body mass index is 31.51 kg/(m^2). -patient weight trending down -patient unable to tolerate diet and with leaking fistula  Code Status: FULL Family Communication: discussed with patient.  Disposition Plan: VERY POOR PROGNOSIS, UNREALISTIC EXPECTATIONS, GIVEN AKI/ESRD, ONGOING SURGICAL ISSUES, NPO, PALLIATIVE FOLLOWING, wants to  continue aggressive course including FULL CODE  Consultants: Cardiology Nephrology PCCM Gen Surgery  Palliative care  Significant Events: 3/16 influenza A+ 3/18 R ACA stroke 3/19 MRI multifocal acute infarction of both hemispheres, large right distal ACA 3/22 TEE severe RAE, severely reduced RV function, severe TR, no LAA thrombus, small PFO on TEE 3/31 to OR ex lap for per pyloric ulcer and sm bowel resection 4/1 septic shock on pressors 4/3 pressors weaned off  4/4 restart pressors, CRRT 4/6 levo switched to neo 4/7 pelvic abscess on CT 4/8 IR placed pelvic drain 4/9 off pressors 4/11 long run of SVT. 4/23 VQ scan which demonstrated intermediate probability for PE 4/24 CT angiogram of her chest: Pending/unable to be done due to lack of access   Antibiotics: Zosyn >stopped on 4/22 (received a total of 23 days of treatment ) Vancomycin 4-16  DVT prophylaxis: SCDs  Objective: Blood pressure 91/57, pulse 117, temperature 98.3 F (36.8 C), temperature source Oral, resp. rate 24, height  (1.676 m), weight 88.5 kg (195 lb 1.7 oz), SpO2 100 %.  Intake/Output Summary (Last 24 hours) at 08/11/14 1333 Last data filed at 08/11/14 1125  Gross per 24 hour  Intake 2807.67 ml  Output    204 ml  Net 2603.67 ml   Exam: General: AAOx3, chronically ill appearing, no distress Lungs: poor air movement,  HD catheter Cardiovascular: Regular rate, no murmurs and no gallops, sinus tachycardia  Abdomen: Abdominal wound dressing, clean and dry; abdomen slightly distended, drain intact and with /green drainage; mild tenderness   Extremities: no edema   Data Reviewed: Basic Metabolic Panel:  Recent Labs Lab 08/05/14 0400 08/06/14 0410  08/07/14 0805 08/08/14 0530 08/09/14 0827 08/09/14 1115 08/10/14 0410 08/11/14 0400 08/11/14 0415 08/11/14 1220  NA 130*  130* 135  < >  --  130* 125* 128* 127*  --  131* 127*  K 3.4*  3.3* 3.3*  < >  --  3.1* 4.3 3.9 4.1  --  3.8 3.9  CL  88*  88* 93*  < >  --  86* 79* 87* 79*  --  95* 92*  CO2 25  26 30   < >  --  31 28 25 26   --  28 25  GLUCOSE 156*  156* 134*  < >  --  136* 422* 371* 97  --  166* 733*  BUN 81*  80* 41*  < >  --  83* 115* 104* 143*  --  54* 11  CREATININE 7.27*  7.28* 4.18*  < >  --  6.06* 7.37* 6.73* 8.90*  --  4.40* 1.46*  CALCIUM 8.4*  8.4* 8.6*  < >  --  8.2* 8.4* 7.3* 8.2*  --  8.0* 7.0*  MG 1.8 2.0  --   --   --   --   --  2.0 2.0  --   --   PHOS 4.4  4.4 1.8*  --  2.2* 3.6  --   --  8.3*  --  4.6  --   < > = values in this interval not displayed.  Liver Function Tests:  Recent Labs Lab 08/05/14 0400 08/06/14 0410 08/08/14 0530 08/10/14 0410 08/11/14 0415  AST  --  31 27 25   --   ALT  --  18 16 15   --   ALKPHOS  --  160* 161* 170*  --   BILITOT  --  1.0 1.1 1.0  --   PROT  --  7.3 6.5 6.6  --   ALBUMIN 2.0* 1.9* 1.7* 1.7* 1.6*   CBC:  Recent Labs Lab 08/08/14 0530 08/08/14 1500 08/09/14 0905 08/10/14 0410 08/11/14 1220  WBC 13.8* 13.9* 13.4* 16.4* 13.6*  NEUTROABS  --   --   --  11.9*  --   HGB 8.1* 7.9* 7.2* 7.8* 6.8*  HCT 25.4* 25.2* 22.0* 24.1* 22.3*  MCV 87.6 88.7 86.6 88.0 93.3  PLT 308 314 318 357 302    CBG:  Recent Labs Lab 08/10/14 1144 08/10/14 1740 08/10/14 2312 08/11/14 0517 08/11/14 1213  GLUCAP 110* 115* 127* 158* >600*    Scheduled Meds:  Scheduled Meds: . sodium chloride   Intravenous Once  . antiseptic oral rinse  7 mL Mouth Rinse q12n4p  . budesonide (PULMICORT) nebulizer solution  0.25 mg Nebulization BID  . chlorhexidine  15 mL Mouth Rinse BID  . darbepoetin (ARANESP) injection - DIALYSIS  200 mcg Intravenous Q Mon-HD  . [START ON 08/12/2014] ferric gluconate (FERRLECIT/NULECIT) IV  125 mg Intravenous Q M,W,F-HD  . insulin aspart  0-9 Units Subcutaneous 4 times per day  . insulin aspart  15 Units Subcutaneous Once  . levothyroxine  12.5 mcg Intravenous Daily  . metoprolol  2.5 mg Intravenous 3 times per day  . sodium chloride  10-40  mL Intracatheter Q12H    Time spent  on care of this patient: 35 minutes   Zannie CoveJOSEPH,Bravlio Luca , MD   [phone] [facility] Office  [phone]   If 7PM-7AM, please contact night-coverage [email or web address] Password [email or web address] 08/11/2014, 1:33 PM   LOS: 55 days

## 2014-08-11 NOTE — Progress Notes (Signed)
Hypoglycemic Event  CBG: <10  Treatment: D50 IV 50 mL  Symptoms: Sweaty, Shaky and Nervous/irritable  Follow-up CBG: Time:1705 CBG Result:80  Possible Reasons for Event: Unknown  Comments/MD notified: MD Mitchel HonourJoseph, P aware.     Fintan Grater, Lurena JoinerRebecca A  Remember to initiate Hypoglycemia Order Set & complete

## 2014-08-11 NOTE — Progress Notes (Signed)
[facility] Progress Note   Subjective: itching better, phos down 4 range. HD again today  Filed Vitals:   08/11/14 0520 08/11/14 0710 08/11/14 0730 08/11/14 0800  BP: 92/56 86/56 156/124 157/119  Pulse:  115 115 114  Temp:  98.3 F (36.8 C)    TempSrc:  Oral    Resp: 17 18 20 24   Height:      Weight:  88.8 kg (195 lb 12.3 oz)    SpO2: 96% 100% 100%    Exam: Lying flat, alert, no distress No jvd Chest clear bilat Abd RUQ drain connected to wall suction, open abd wound w packing Ext no edema Access is R IJ cath and left arm AVF +bruit Neuro is ox 3, L arm stronger than a few weeks ago  HD: new start, MWF schedule in hospital  Summary: 59 yo w CKD stage V, s/p AVF placement, smoker, HTN, obesity presented 06/17/14 w SOB/ gen weakness x 2 wks. Had worsening azotemia/ uremia/ acidosis, also H1N1 flu+. Admitted and rec'd her first HD on 06/18/14 which improved resp status. On 3/18 pt noted to be hemiparetic on the left side and CT scan showed R ACA infarct. MRI noted acute infarction throughout both hemispheres suggesting embolic phenomenon. On 3/26 she had tunneled HD cath placed due to immaturity of the AVF. On 3/30 she underwent exlap for acute abd pain/ abd tenderness w AMS. CT had showed pneumoperitoneum. Exlap showed diffuse peritonitis w perforated pyloric ulcer and ischemic area of SB in mid ileum. Underwent patch repair of perf ulcer and segmental resection of ischemic ileum. Was on CRRT from 3/30- 07/13/14 and is now back on intermittent HD.          Assessment: 1. ESRD new start MWF inpatient, using cath as AVF poor fxn 2. CVA acute w left hemiparesis 3. S/P exlap for perf pyloric ulcer/ SBR for ischemic SB 3/30 - w high output duodenal fistula 4. Abd wound - open, packing per surg 5. Nutrition - TNA / npo for fistula healing, supposed to have NG in as well to drain gastric fluids but pt pulled two of them out so not replaced at this time 6. Volume - large GI  losses have balanced vol load from TNA, was on midodrine for low BP's but is off now 7. Pelvic fluid collection - s/p IR drain, out now 8. 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 Anemia Hb dropping in to low 7's, ^darbe 100> 200/wk max dose, recheck tsat 11. 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 DM2 14. 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 Hypokalemia - due to large GI losses, pharm replacing. Appreciate their assistance. 4K bath w HD. Better 16. Hypomagnesemia - better 17. Hyponatremia - from dilute TNA, treat w HD 18. Pruritis- better after HD/ lowered phos level 19. SVT prn IV BB per cardiology  Plan - HD again today, then thurs/ sat this week    Vinson Moselleob Welford Christmas MD  pager [phone]    cell [phone]  08/11/2014, 8:51 AM     Recent Labs Lab 08/08/14 0530  08/09/14 1115 08/10/14 0410 08/11/14 0415  NA 130*  < > 128* 127* 131*  K 3.1*  < > 3.9 4.1 3.8  CL 86*  < > 87* 79* 95*  CO2 31  < > 25 26 28   GLUCOSE 136*  < > 371* 97 166*  BUN 83*  < >  104* 143* 54*  CREATININE 6.06*  < > 6.73* 8.90* 4.40*  CALCIUM 8.2*  < > 7.3* 8.2* 8.0*  PHOS 3.6  --   --  8.3* 4.6  < > = values in this interval not displayed.  Recent Labs Lab 08/06/14 0410 08/08/14 0530 08/10/14 0410 08/11/14 0415  AST 31 27 25   --   ALT 18 16 15   --   ALKPHOS 160* 161* 170*  --   BILITOT 1.0 1.1 1.0  --   PROT 7.3 6.5 6.6  --   ALBUMIN 1.9* 1.7* 1.7* 1.6*    Recent Labs Lab 08/08/14 1500 08/09/14 0905 08/10/14 0410  WBC 13.9* 13.4* 16.4*  NEUTROABS  --   --  11.9*  HGB 7.9* 7.2* 7.8*  HCT 25.2* 22.0* 24.1*  MCV 88.7 86.6 88.0  PLT 314 318 357   . antiseptic oral rinse  7 mL Mouth Rinse q12n4p  . budesonide (PULMICORT) nebulizer solution  0.25 mg Nebulization BID  . chlorhexidine  15 mL Mouth Rinse BID  . darbepoetin (ARANESP) injection - DIALYSIS  200 mcg Intravenous Q Mon-HD  . insulin aspart  0-9 Units Subcutaneous 4 times per day  . levothyroxine   12.5 mcg Intravenous Daily  . metoprolol  2.5 mg Intravenous 3 times per day  . sodium chloride  10-40 mL Intracatheter Q12H   . sodium chloride 10 mL/hr at 08/10/14 2300  . TPN (CLINIMIX) Adult without lytes 100 mL/hr at 08/10/14 2300   And  . fat emulsion 240 mL (08/11/14 0600)  . octreotide  (SANDOSTATIN)    IV infusion     sodium chloride, sodium chloride, alteplase, bisacodyl, diphenhydrAMINE, diphenhydrAMINE-zinc acetate, feeding supplement (NEPRO CARB STEADY), HYDROmorphone (DILAUDID) injection, levalbuterol, lidocaine (PF), lidocaine-prilocaine, ondansetron (ZOFRAN) IV, pentafluoroprop-tetrafluoroeth, sodium chloride

## 2014-08-11 NOTE — Progress Notes (Signed)
CRITICAL VALUE ALERT  Critical value received:  Glucose 733  Date of notification:  08/11/2014  Time of notification:  1325  Critical value read back: yes  Nurse who received alert:  Silvio PateBarbara Sirus Labrie  MD notified (1st page):  Mitchel HonourJoseph, P  Time of first page:  1330  MD notified (2nd page):  Time of second page:  Responding MD:  Mitchel HonourJoseph, P  Time MD responded:  1331

## 2014-08-11 NOTE — Progress Notes (Signed)
Physical Therapy Treatment Patient Details Name: Eleanna C Buccellato MRN: [id number] DOB: 12-Mar-1956 Today's Date: 08/11/2014    History of Present Illness Milan Mikeal HawthorneC Scheller is a 59 y.o. female with a history of CKD and hypertension [facility] complaining of shortness of breath and generalized weakness x 2 weeks. Admitted with acute hypoxemic respiratory failure. During hospital stay noted to have weakness of her left side. 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  She developed septic shock 4/1 with pressors weaned off 4/3.  Began CRRT and pressors restarted 4/4; Developed pelvic abcess 4/7 with pelvic drain placed 4/8.  weaned off pressors 4/9.  Runs of SVT 4/5 and 4/11    PT Comments    Progressing steadily.  Emphasis on standing balance, w/shifting and other pregait activity.   Follow Up Recommendations  SNF;Supervision/Assistance - 24 hour     Equipment Recommendations  Other (comment) (TBA)    Recommendations for Other Services       Precautions / Restrictions Precautions Precautions: Fall Precaution Comments: JP drain attached to wall suction     Mobility  Bed Mobility Overal bed mobility: Needs Assistance Bed Mobility: Supine to Sit;Sit to Supine     Supine to sit: Mod assist Sit to supine: +2 for physical assistance;Max assist   General bed mobility comments: up via left side where pt has limited use of the L UE.  Use of symetrical and asymetrical scoot with assist  Transfers Overall transfer level: Needs assistance Equipment used:  (with assist of chair back) Transfers: Sit to/from BJ'sStand;Stand Pivot Transfers Sit to Stand: Mod assist;+2 safety/equipment Stand pivot transfers: Mod assist;+2 physical assistance;+2 safety/equipment       General transfer comment: cues for hand placement assist to come forward, graded L knee  assist.  With transfer assisted face to face, helped with w/shift and taking small steps  Ambulation/Gait                 Stairs            Wheelchair Mobility    Modified Rankin (Stroke Patients Only) Modified Rankin (Stroke Patients Only) Pre-Morbid Rankin Score: Slight disability Modified Rankin: Severe disability     Balance Overall balance assessment: Needs assistance Sitting-balance support: No upper extremity supported;Single extremity supported Sitting balance-Leahy Scale: Fair Sitting balance - Comments: tended to list posteriorly more today, ?fatigued from HD.   Standing balance support: Bilateral upper extremity supported Standing balance-Leahy Scale: Poor Standing balance comment: stood at EOB holding to chairback in full extension, doing mini squats to lock and unlock knees                    Cognition Arousal/Alertness: Awake/alert Behavior During Therapy: WFL for tasks assessed/performed Overall Cognitive Status: Within Functional Limits for tasks assessed                      Exercises      General Comments        Pertinent Vitals/Pain Pain Assessment: No/denies pain    Home Living                      Prior Function            PT Goals (current goals can now be found in the care plan section) Acute Rehab  PT Goals Patient Stated Goal: to get better  PT Goal Formulation: With patient Time For Goal Achievement: 08/19/14 Potential to Achieve Goals: Fair Progress towards PT goals: Progressing toward goals    Frequency  Min 3X/week    PT Plan Current plan remains appropriate    Co-evaluation             End of Session   Activity Tolerance: Patient tolerated treatment well Patient left: in chair;with call bell/phone within reach;with chair alarm set     Time: [id number] PT Time Calculation (min) (ACUTE ONLY): 25 min  Charges:  $Therapeutic Activity: 23-37 mins                    G Codes:       Jaley Yan, Eliseo GumKenneth V 08/11/2014, 5:15 PM  08/11/2014  Hooper BingKen Agnieszka Newhouse, PT [phone]  (pager)

## 2014-08-11 NOTE — Progress Notes (Signed)
Patient refused CPAP tonight. RN aware. Explained to Patient that if they changed their mind, to just have the RN call Respiratory and we would come set them up.

## 2014-08-11 NOTE — Progress Notes (Signed)
[facility] Progress Note  41 Days Post-Op  Subjective: Pt c/o abdominal pain and not being able to eat.  Looks depressed.    Objective: Vital signs in last 24 hours: Temp:  [97.3 F (36.3 C)-98.6 F (37 C)] 98.3 F (36.8 C) (05/10 0710) Pulse Rate:  [109-137] 114 (05/10 0800) Resp:  [15-27] 24 (05/10 0800) BP: (69-157)/(45-124) 157/119 mmHg (05/10 0800) SpO2:  [30 %-100 %] 100 % (05/10 0730) Weight:  [88 kg (194 lb 0.1 oz)-89.2 kg (196 lb 10.4 oz)] 88.8 kg (195 lb 12.3 oz) (05/10 0710) Last BM Date: 07/31/14  Intake/Output from previous day: 05/09 0701 - 05/10 0700 In: 2807.7 [I.V.:275.8; TPN:2531.8] Out: 150 [Drains:150] Intake/Output this shift:    PE: Gen:  Alert, NAD, depressed Card:  RRR, no M/G/R heard Pulm:  CTA, no W/R/R Abd: Soft, mildly tender, ND, +BS, no HSM, midline wound with yellow slough at base, some minimal granulation tissue formation at edges, Blake drain in LLQ with 36450mL/24hr bilious in color   Lab Results:   Recent Labs  08/09/14 0905 08/10/14 0410  WBC 13.4* 16.4*  HGB 7.2* 7.8*  HCT 22.0* 24.1*  PLT 318 357   BMET  Recent Labs  08/10/14 0410 08/11/14 0415  NA 127* 131*  K 4.1 3.8  CL 79* 95*  CO2 26 28  GLUCOSE 97 166*  BUN 143* 54*  CREATININE 8.90* 4.40*  CALCIUM 8.2* 8.0*   PT/INR No results for input(s): LABPROT, INR in the last 72 hours. CMP     Component Value Date/Time   NA 131* 08/11/2014 0415   NA 139 10/02/2012 1046   K 3.8 08/11/2014 0415   K 4.4 10/02/2012 1046   CL 95* 08/11/2014 0415   CO2 28 08/11/2014 0415   CO2 25 10/02/2012 1046   GLUCOSE 166* 08/11/2014 0415   GLUCOSE 110 10/02/2012 1046   BUN 54* 08/11/2014 0415   BUN 35.2* 10/02/2012 1046   CREATININE 4.40* 08/11/2014 0415   CREATININE 2.5* 10/02/2012 1046   CALCIUM 8.0* 08/11/2014 0415   CALCIUM 9.8 10/02/2012 1046   PROT 6.6 08/10/2014 0410   PROT 8.4* 10/02/2012 1046   ALBUMIN 1.6* 08/11/2014 0415   ALBUMIN 3.1* 10/02/2012  1046   AST 25 08/10/2014 0410   AST 9 10/02/2012 1046   ALT 15 08/10/2014 0410   ALT <6 Repeated and Verified 10/02/2012 1046   ALKPHOS 170* 08/10/2014 0410   ALKPHOS 108 10/02/2012 1046   BILITOT 1.0 08/10/2014 0410   BILITOT 0.21 10/02/2012 1046   GFRNONAA 10* 08/11/2014 0415   GFRAA 12* 08/11/2014 0415   Lipase  No results found for: LIPASE     Studies/Results: No results found.  Anti-infectives: Anti-infectives    Start     Dose/Rate Route Frequency Ordered Stop   08/04/14 1200  vancomycin (VANCOCIN) 50 mg/mL oral solution 125 mg  Status:  Discontinued     125 mg Oral 4 times per day 08/04/14 0851 08/08/14 1229   07/18/14 1800  vancomycin (VANCOCIN) 50 mg/mL oral solution 125 mg  Status:  Discontinued     125 mg Oral 4 times per day 07/18/14 1455 08/04/14 0845   07/17/14 1830  vancomycin (VANCOCIN) IVPB 750 mg/150 ml premix     750 mg 150 mL/hr over 60 Minutes Intravenous  Once 07/17/14 1818 07/18/14 0024   07/15/14 1400  metroNIDAZOLE (FLAGYL) tablet 500 mg  Status:  Discontinued     500 mg Oral 3 times per day 07/15/14 1202 07/18/14  1533   07/13/14 2200  piperacillin-tazobactam (ZOSYN) IVPB 2.25 g  Status:  Discontinued     2.25 g 100 mL/hr over 30 Minutes Intravenous 3 times per day 07/13/14 1318 07/24/14 1138   07/13/14 2000  fluconazole (DIFLUCAN) IVPB 200 mg  Status:  Discontinued     200 mg 100 mL/hr over 60 Minutes Intravenous Every 24 hours 07/13/14 1318 07/19/14 1027   07/07/14 1200  vancomycin (VANCOCIN) IVPB 1000 mg/200 mL premix  Status:  Discontinued     1,000 mg 200 mL/hr over 60 Minutes Intravenous Every 24 hours 07/07/14 0937 07/14/14 1712   07/06/14 2000  fluconazole (DIFLUCAN) IVPB 400 mg  Status:  Discontinued     400 mg 100 mL/hr over 120 Minutes Intravenous Every 24 hours 07/06/14 1507 07/13/14 1318   07/06/14 1800  piperacillin-tazobactam (ZOSYN) IVPB 2.25 g  Status:  Discontinued     2.25 g 100 mL/hr over 30 Minutes Intravenous 4 times per  day 07/06/14 1505 07/13/14 1318   07/05/14 2000  fluconazole (DIFLUCAN) IVPB 200 mg  Status:  Discontinued     200 mg 100 mL/hr over 60 Minutes Intravenous Every 24 hours 07/05/14 0757 07/06/14 1507   07/05/14 1400  piperacillin-tazobactam (ZOSYN) IVPB 2.25 g  Status:  Discontinued     2.25 g 100 mL/hr over 30 Minutes Intravenous 3 times per day 07/05/14 0749 07/06/14 1505   07/02/14 1800  vancomycin (VANCOCIN) IVPB 1000 mg/200 mL premix  Status:  Discontinued     1,000 mg 200 mL/hr over 60 Minutes Intravenous Every 24 hours 07/01/14 1858 07/05/14 0801   07/01/14 2200  piperacillin-tazobactam (ZOSYN) IVPB 3.375 g  Status:  Discontinued     3.375 g 100 mL/hr over 30 Minutes Intravenous 4 times per day 07/01/14 1858 07/05/14 0749   07/01/14 2000  fluconazole (DIFLUCAN) IVPB 400 mg  Status:  Discontinued     400 mg 100 mL/hr over 120 Minutes Intravenous Every 24 hours 07/01/14 1858 07/05/14 0757   07/01/14 1400  fluconazole (DIFLUCAN) IVPB 200 mg  Status:  Discontinued     200 mg 100 mL/hr over 60 Minutes Intravenous Every 24 hours 07/01/14 1330 07/01/14 1853   07/01/14 1000  piperacillin-tazobactam (ZOSYN) IVPB 2.25 g  Status:  Discontinued     2.25 g 100 mL/hr over 30 Minutes Intravenous Every 8 hours 07/01/14 0952 07/01/14 1853   07/01/14 1000  vancomycin (VANCOCIN) 500 mg in sodium chloride 0.9 % 100 mL IVPB     500 mg 100 mL/hr over 60 Minutes Intravenous  Once 07/01/14 0952 07/01/14 1126   06/30/14 1200  vancomycin (VANCOCIN) IVPB 1000 mg/200 mL premix     1,000 mg 200 mL/hr over 60 Minutes Intravenous  Once 06/30/14 0944 06/30/14 1403   06/30/14 1200  ceFEPIme (MAXIPIME) 2 g in dextrose 5 % 50 mL IVPB     2 g 100 mL/hr over 30 Minutes Intravenous  Once 06/30/14 0944 06/30/14 1425   06/30/14 0100  vancomycin (VANCOCIN) 2,000 mg in sodium chloride 0.9 % 500 mL IVPB     2,000 mg 250 mL/hr over 120 Minutes Intravenous  Once 06/30/14 0048 06/30/14 0525   06/30/14 0100  ceFEPIme  (MAXIPIME) 2 g in dextrose 5 % 50 mL IVPB     2 g 100 mL/hr over 30 Minutes Intravenous  Once 06/30/14 0048 06/30/14 0322   06/27/14 0600  cefUROXime (ZINACEF) 1.5 g in dextrose 5 % 50 mL IVPB     1.5 g 100 mL/hr over  30 Minutes Intravenous On call to O.R. 06/26/14 1019 06/27/14 0630   06/19/14 1800  oseltamivir (TAMIFLU) capsule 30 mg     30 mg Oral Every M-W-F (1800) 06/19/14 1003 06/22/14 1841   06/18/14 1600  oseltamivir (TAMIFLU) capsule 30 mg  Status:  Discontinued     30 mg Oral Daily 06/18/14 1538 06/19/14 1003       Assessment/Plan POD #41 s/p Exploratory Laparotomy with graham patch closure of perforated pyloric ulcer, SBR for ischemic bowel - 07/01/2014 - Derrell Lolling Intra-abdominal fluid collection, no drain Now with duodenal fistula -CT 08/03/14 showed 3.5x5.7x3.0cm pre-rectal fluid collection. No drain was replaced in this pelvic fluid collection after previous drain accidentally pulled out -Fistula study done and shows JP drain near graham patch with connection to the duodenal bulb. -Strict NPO, refusing NGT, output has decreased, but is bilious. Sandostatin seems to be helping.  -could consider IR placing a feeding tube past the leak -Wound vac discontinued, now on NS WD dressing changes, switch to TID until cleaner then resume BID changes -I&Os and routine drain care VTE prophylaxis-SCDs ID--Zosyn stopped on day #24 (07/24/14). WBC up. May need repeat CT of abdomen to evaluate lower abdominal fluid collection C diff + New ESRD  PCM/TNA      LOS: 55 days    DORT, Selassie Spatafore 08/11/2014, 9:18 AM Pager: [phone]

## 2014-08-11 NOTE — Progress Notes (Signed)
Subjective: No specific complaints  Objective: Vital signs in last 24 hours: Temp:  [97.3 F (36.3 C)-98.6 F (37 C)] 98.3 F (36.8 C) (05/10 0710) Pulse Rate:  [109-119] 115 (05/10 1000) Resp:  [15-27] 21 (05/10 1000) BP: (69-160)/(45-124) 160/116 mmHg (05/10 1000) SpO2:  [30 %-100 %] 100 % (05/10 0730) Weight:  [194 lb 0.1 oz (88 kg)-196 lb 10.4 oz (89.2 kg)] 195 lb 12.3 oz (88.8 kg) (05/10 0710) Last BM Date: 07/31/14  Intake/Output from previous day: 05/09 0701 - 05/10 0700 In: 2807.7 [I.V.:275.8; TPN:2531.8] Out: 150 [Drains:150] Intake/Output this shift:    Medications Scheduled Meds: . antiseptic oral rinse  7 mL Mouth Rinse q12n4p  . budesonide (PULMICORT) nebulizer solution  0.25 mg Nebulization BID  . chlorhexidine  15 mL Mouth Rinse BID  . darbepoetin (ARANESP) injection - DIALYSIS  200 mcg Intravenous Q Mon-HD  . HYDROmorphone      . insulin aspart  0-9 Units Subcutaneous 4 times per day  . levothyroxine  12.5 mcg Intravenous Daily  . metoprolol  2.5 mg Intravenous 3 times per day  . sodium chloride  10-40 mL Intracatheter Q12H   Continuous Infusions: . sodium chloride 10 mL/hr at 08/10/14 2300  . TPN (CLINIMIX) Adult without lytes 100 mL/hr at 08/10/14 2300   And  . fat emulsion 240 mL (08/11/14 0600)  . TPN (CLINIMIX) Adult without lytes     And  . fat emulsion    . octreotide  (SANDOSTATIN)    IV infusion     PRN Meds:.sodium chloride, sodium chloride, alteplase, bisacodyl, diphenhydrAMINE, diphenhydrAMINE-zinc acetate, feeding supplement (NEPRO CARB STEADY), HYDROmorphone (DILAUDID) injection, levalbuterol, lidocaine (PF), lidocaine-prilocaine, ondansetron (ZOFRAN) IV, pentafluoroprop-tetrafluoroeth, sodium chloride  PE: General appearance: alert, cooperative and no distress Lungs: Diffuse wheezing. Heart: Reg rhythm. Rate fast. No MM Abdomen: +BS Extremities: No LEE Pulses: 2+ and symmetric Skin: Warm and dry  Lab Results:   Recent  Labs  08/08/14 1500 08/09/14 0905 08/10/14 0410  WBC 13.9* 13.4* 16.4*  HGB 7.9* 7.2* 7.8*  HCT 25.2* 22.0* 24.1*  PLT 314 318 357   BMET  Recent Labs  08/09/14 1115 08/10/14 0410 08/11/14 0415  NA 128* 127* 131*  K 3.9 4.1 3.8  CL 87* 79* 95*  CO2 25 26 28   GLUCOSE 371* 97 166*  BUN 104* 143* 54*  CREATININE 6.73* 8.90* 4.40*  CALCIUM 7.3* 8.2* 8.0*   Assessment/Plan  Principal Problem:   Acute respiratory failure with hypoxia Active Problems:   Cerebral thrombosis with cerebral infarction   Hypertension   ESRD needing dialysis   Obesity (BMI 30-39.9)   Depression   Left renal mass   Chronic diastolic heart failure   H1N1 influenza   Tobacco use disorder   Renal failure (ARF), acute on chronic   Hyperlipidemia   PSVT (paroxysmal supraventricular tachycardia)   SOB (shortness of breath)   Perforated gastric ulcer   Abnormal TSH   History of intermediate V/Q scan   HIT (heparin-induced thrombocytopenia)   Fistula   Abdominal pain   Palliative care encounter   ESRD (end stage renal disease)   SVT (supraventricular tachycardia)   Right heart failure   PSVT She has several episodes yesterday morning between 8:15 and 10:15, while at HD.  Rate was 136. All spontaneously ended.  A can't review tele currently since in HD.  No saved tele strips since yesterday.   She is in sinus tach right now as she was yesterday. She has IV lopressor  2.5mg  Q8 however, BP is low so she is not getting it.  Used amio if PSVT worsens.   Mag 2.0 and K 3.8. Negative PE. Echo, 4/21: Normal LV systolic function, impaired relaxation with normal filling pressures.Severely dilated right sided chamber and severe pulmonary hypertension consistent with cor pulmonale. CT negative for PE on 4/27. Very wheezy on exam. She has Xopenenex.     LOS: 55 days    [name], [name] 08/11/2014 11:03 AM  Attending Note:   The patient was seen and examined.  Agree with  assessment and plan as noted above.  Changes made to the above note as needed.  Pt has multiple medical problems.   No recurrent PSVT for now   She has severe pulmonary hypertension.    Stephanie MixerPhilip J. Addalie Calles, Montez HagemanJr., MD, [facility] 08/11/2014, 1:56 PM [address],  [address] Office [phone] Pager [phone]

## 2014-08-11 NOTE — Progress Notes (Signed)
PARENTERAL NUTRITION CONSULT NOTE - FOLLOW UP  Pharmacy Consult for TPN Indication:  High output Duodenal Fistula  No Known Allergies  Patient Measurements: Height: 5\' 6"  (167.6 cm) Weight: 195 lb 12.3 oz (88.8 kg) IBW/kg (Calculated) : 59.3 Adjusted Body Weight: 71.2kg  Vital Signs: Temp: 98.3 F (36.8 C) (05/10 0710) Temp Source: Oral (05/10 0710) BP: 157/119 mmHg (05/10 0800) Pulse Rate: 114 (05/10 0800) Intake/Output from previous day: 05/09 0701 - 05/10 0700 In: 2807.7 [I.V.:275.8; TPN:2531.8] Out: 150 [Drains:150]  Labs:  Recent Labs  08/08/14 1500 08/09/14 0905 08/10/14 0410  WBC 13.9* 13.4* 16.4*  HGB 7.9* 7.2* 7.8*  HCT 25.2* 22.0* 24.1*  PLT 314 318 357    Recent Labs  08/09/14 1115 08/10/14 0410 08/11/14 0400 08/11/14 0415  NA 128* 127*  --  131*  K 3.9 4.1  --  3.8  CL 87* 79*  --  95*  CO2 25 26  --  28  GLUCOSE 371* 97  --  166*  BUN 104* 143*  --  54*  CREATININE 6.73* 8.90*  --  4.40*  CALCIUM 7.3* 8.2*  --  8.0*  MG  --  2.0 2.0  --   PHOS  --  8.3*  --  4.6  PROT  --  6.6  --   --   ALBUMIN  --  1.7*  --  1.6*  AST  --  25  --   --   ALT  --  15  --   --   ALKPHOS  --  170*  --   --   BILITOT  --  1.0  --   --   PREALBUMIN  --  9.4*  --   --   TRIG  --  141  --   --    Estimated Creatinine Clearance: 15.6 mL/min (by C-G formula based on Cr of 4.4).   Recent Labs  08/10/14 1740 08/10/14 2312 08/11/14 0517  GLUCAP 115* 127* 158*   Insulin Requirements in the past 24 hours:  1 unit Novolog SSI,  Current Nutrition:  NPO Clinimix 5/15 (without electrolytes) at 132ml/hr and 20% IVFE at 84ml/hr which will provide 2184 kcal and 120 gm protein.  Nutritional Goals: (per RD note 4/28) 2100-2300 kCal, 120-140 grams of protein per day Goal: Clinimix 5/15 at 143ml/hr + 20% IVFE 48ml/hr to provide 2184 kcal and 120 gm protein  Assessment: 33 YOF with complicated hospital course. S/p closure perforated pyloric ulcer, SBR for  ischemic necrosis, diffuse peritonitis on 07/01/14. Pharmacy consulted to provide TPN 4/1-4/15 for nutrition support for prolonged ileus. Pt then transitioned to po diet + supplements. 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  GI: Duodenal fistula. NPO. Open abd wound, (+)abd pain. Drain output changed from bile to bright red blood overnight on 5/7, now bilious. Drain output 2200 5/7 and 950 cc 9/8 and 350 cc on 9/9. Octreotide drip ordered 5/10 am. .  Albumin 1.6. Prealbumin remains stable and low at 9.4 Noted palliative care discussions ongoing but for now pt/family wish for aggressive care. NGT out and pt refuses replacement.   5/10 add octreotide drip  Endo: h/o DM. had low CBGs prior to TPN start. Got 1 units SSI, on IV synthroid.   Lytes: lytes added to TPN Saturday 5/7 and taken out Monday 5/9; -  K+ 3.8 (AFib, goal >= 4)  Current orders for 4K HD bath, in HD now - Dr. Jonnie Finner  wants pharmacy to manage K+. Na low at 131 from dilute TPN.  To ge max Na in HD bath of 145.  Ca 8.0, CoCa 9.92, Phos 8.3>4.6 , CaxPhos = ~ 46, < than 55 as desired. Mag 2 - within goal for afib.  Renal: New ESRD - started 3/17. HD on M/W/F. Getting extra HD today to get phos/solute down.   Aranesp 100 qMon (decr from 150 to 100 on 5/2 2nd Hg 03/05/09.7). Hg 7.8 on 5/9, aranesp dose increased to 200 5/9  Pulm: asthma, COPD. RA. CT negative for PE. CCM signed off 5/3  Cards: HTN. (afib).  Midodrine and amio on hold (no NGT and NPO) IV metoprolol. CHADS-VASc =4 (not good candidate for [facility]). Cards rec restart IV amio if SVT worsens  Hepatotobil: Alk phos slightly elevated with upward trend, other LFTs wnl. TG 141 (5/9)  Neuro: hx depression / insomnia - new stroke noted on 3/18.   ID: completed PO Vanc D#14/14on 5/7  for cdiff colitis, Diarrhea is better. WBC up to 16.3 on 5/4  Best Practices: SCDs, PPI IV  TPN Access: PICC line in IR 4/27 TPN start date: 4/1->4/15; restart  4/27>>  Plan:  -  Continue Clinimix 5/15 without electrolytes at 100/hr and 20% IVFE at 42ml/hr.  Lytes taken out on Monday 5/9 due to elevated CaxPhos product -   Trace elements and MVI daily in TPN.  -  Continue sensitive SSI q6h - will check renal panel and mag in am  Eudelia Bunch, Pharm.D. [phone] 08/11/2014 8:39 AM

## 2014-08-11 NOTE — Progress Notes (Signed)
Hypoglycemic Event  CBG: too low to be on meter  Treatment: D50 IV 50 mL  Symptoms: Sweaty and Shaky  Follow-up CBG: Time:1705 CBG Result:80  Possible Reasons for Event: Inadequate meal intake and Medication regimen: 15 U insluin given earlier for hypergly  Comments/MD notified:MD Josefa HalfJoseph    [name], [name]  Remember to initiate Hypoglycemia Order Set & complete

## 2014-08-12 ENCOUNTER — Encounter (HOSPITAL_COMMUNITY): Payer: Self-pay | Admitting: Radiology

## 2014-08-12 ENCOUNTER — Inpatient Hospital Stay (HOSPITAL_COMMUNITY): Payer: Medicaid Other

## 2014-08-12 DIAGNOSIS — L299 Pruritus, unspecified: Secondary | ICD-10-CM

## 2014-08-12 DIAGNOSIS — K9189 Other postprocedural complications and disorders of digestive system: Secondary | ICD-10-CM | POA: Insufficient documentation

## 2014-08-12 DIAGNOSIS — E46 Unspecified protein-calorie malnutrition: Secondary | ICD-10-CM | POA: Insufficient documentation

## 2014-08-12 LAB — GLUCOSE, CAPILLARY
GLUCOSE-CAPILLARY: 142 mg/dL — AB (ref 70–99)
GLUCOSE-CAPILLARY: 160 mg/dL — AB (ref 70–99)
GLUCOSE-CAPILLARY: 177 mg/dL — AB (ref 70–99)

## 2014-08-12 LAB — PROTIME-INR
INR: 1.19 (ref 0.00–1.49)
PROTHROMBIN TIME: 15.2 s (ref 11.6–15.2)

## 2014-08-12 LAB — CBC
HCT: 29.1 % — ABNORMAL LOW (ref 36.0–46.0)
Hemoglobin: 9 g/dL — ABNORMAL LOW (ref 12.0–15.0)
MCH: 27.4 pg (ref 26.0–34.0)
MCHC: 30.9 g/dL (ref 30.0–36.0)
MCV: 88.4 fL (ref 78.0–100.0)
PLATELETS: 279 10*3/uL (ref 150–400)
RBC: 3.29 MIL/uL — ABNORMAL LOW (ref 3.87–5.11)
RDW: 19.3 % — ABNORMAL HIGH (ref 11.5–15.5)
WBC: 13.2 10*3/uL — AB (ref 4.0–10.5)

## 2014-08-12 LAB — RENAL FUNCTION PANEL
ALBUMIN: 1.7 g/dL — AB (ref 3.5–5.0)
Anion gap: 8 (ref 5–15)
BUN: 30 mg/dL — ABNORMAL HIGH (ref 6–20)
CALCIUM: 7.9 mg/dL — AB (ref 8.9–10.3)
CHLORIDE: 97 mmol/L — AB (ref 101–111)
CO2: 27 mmol/L (ref 22–32)
CREATININE: 2.67 mg/dL — AB (ref 0.44–1.00)
GFR calc Af Amer: 22 mL/min — ABNORMAL LOW (ref >60)
GFR calc non Af Amer: 19 mL/min — ABNORMAL LOW (ref >60)
Glucose, Bld: 145 mg/dL — ABNORMAL HIGH (ref 70–99)
Phosphorus: 2.2 mg/dL — ABNORMAL LOW (ref 2.5–4.6)
Potassium: 3.9 mmol/L (ref 3.5–5.1)
Sodium: 132 mmol/L — ABNORMAL LOW (ref 135–145)

## 2014-08-12 LAB — APTT: aPTT: 36 seconds (ref 24–37)

## 2014-08-12 LAB — MAGNESIUM: MAGNESIUM: 1.7 mg/dL (ref 1.7–2.4)

## 2014-08-12 MED ORDER — LIDOCAINE HCL 1 % IJ SOLN
INTRAMUSCULAR | Status: AC
  Administered 2014-08-12: 17:00:00
  Filled 2014-08-12: qty 20

## 2014-08-12 MED ORDER — GLUCAGON HCL (RDNA) 1 MG IJ SOLR
INTRAMUSCULAR | Status: AC | PRN
  Administered 2014-08-12: 1 mg via INTRAVENOUS

## 2014-08-12 MED ORDER — MIDAZOLAM HCL 2 MG/2ML IJ SOLN
INTRAMUSCULAR | Status: AC
  Filled 2014-08-12: qty 4

## 2014-08-12 MED ORDER — CEFAZOLIN SODIUM-DEXTROSE 2-3 GM-% IV SOLR
INTRAVENOUS | Status: AC
  Administered 2014-08-12: 16:00:00
  Filled 2014-08-12: qty 50

## 2014-08-12 MED ORDER — FENTANYL CITRATE (PF) 100 MCG/2ML IJ SOLN
INTRAMUSCULAR | Status: AC | PRN
  Administered 2014-08-12 (×2): 50 ug via INTRAVENOUS

## 2014-08-12 MED ORDER — TRACE MINERALS CR-CU-F-FE-I-MN-MO-SE-ZN IV SOLN
INTRAVENOUS | Status: AC
  Administered 2014-08-12: 18:00:00 via INTRAVENOUS
  Filled 2014-08-12: qty 2400

## 2014-08-12 MED ORDER — CAMPHOR-MENTHOL 0.5-0.5 % EX LOTN
TOPICAL_LOTION | CUTANEOUS | Status: DC | PRN
  Administered 2014-08-23: 06:00:00 via TOPICAL
  Filled 2014-08-12 (×3): qty 222

## 2014-08-12 MED ORDER — SODIUM PHOSPHATE 3 MMOLE/ML IV SOLN
10.0000 mmol | Freq: Once | INTRAVENOUS | Status: DC
  Filled 2014-08-12: qty 3.33

## 2014-08-12 MED ORDER — MAGNESIUM SULFATE 2 GM/50ML IV SOLN
2.0000 g | Freq: Once | INTRAVENOUS | Status: AC
  Administered 2014-08-13: 2 g via INTRAVENOUS

## 2014-08-12 MED ORDER — FENTANYL CITRATE (PF) 100 MCG/2ML IJ SOLN
INTRAMUSCULAR | Status: AC
  Administered 2014-08-12: 16:00:00
  Filled 2014-08-12: qty 2

## 2014-08-12 MED ORDER — HYDROCODONE-ACETAMINOPHEN 5-325 MG PO TABS: 1.0000 | ORAL_TABLET | ORAL | Status: DC | PRN

## 2014-08-12 MED ORDER — MIDAZOLAM HCL 2 MG/2ML IJ SOLN
INTRAMUSCULAR | Status: AC | PRN
  Administered 2014-08-12 (×3): 1 mg via INTRAVENOUS

## 2014-08-12 MED ORDER — HYDROMORPHONE HCL 1 MG/ML IJ SOLN
1.0000 mg | INTRAMUSCULAR | Status: DC | PRN
  Administered 2014-08-13: 1 mg via INTRAVENOUS
  Filled 2014-08-12: qty 1

## 2014-08-12 MED ORDER — ONDANSETRON HCL 4 MG/2ML IJ SOLN: 4.0000 mg | INTRAMUSCULAR | Status: DC | PRN

## 2014-08-12 MED ORDER — FAT EMULSION 20 % IV EMUL
250.0000 mL | INTRAVENOUS | Status: AC
  Administered 2014-08-12: 250 mL via INTRAVENOUS
  Filled 2014-08-12: qty 250

## 2014-08-12 MED ORDER — CEFAZOLIN SODIUM-DEXTROSE 2-3 GM-% IV SOLR
2.0000 g | INTRAVENOUS | Status: AC
  Administered 2014-08-12: 2 g via INTRAVENOUS
  Filled 2014-08-12: qty 50

## 2014-08-12 MED ORDER — IOHEXOL 300 MG/ML  SOLN
50.0000 mL | Freq: Once | INTRAMUSCULAR | Status: AC | PRN
Start: 2014-08-12 — End: 2014-08-12
  Administered 2014-08-12: 40 mL

## 2014-08-12 MED ORDER — GLUCAGON HCL RDNA (DIAGNOSTIC) 1 MG IJ SOLR
INTRAMUSCULAR | Status: AC
  Administered 2014-08-12: 17:00:00
  Filled 2014-08-12: qty 1

## 2014-08-12 NOTE — Consult Note (Signed)
Chief Complaint: Chief Complaint  Patient presents with  . Shortness of Breath  . Asthma  . Cough  small bowel resection 07/01/14  Referring Physician(s): [facility]  History of Present Illness: [name] is a 59 y.o. 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 Asthma   . Hypertension   . Gout   . Arthritis   . Insomnia   . Chronic kidney disease   . Depression   . Renal insufficiency     Past Surgical History  Procedure Laterality Date  . Multiple tooth extractions    . Av fistula placement Left 01/28/2014    Procedure: ARTERIOVENOUS (AV) FISTULA CREATION;  Surgeon: Sherren Kernsharles E Fields, MD;  [facility];  Service: Vascular;  Laterality: Left;  . Tee without cardioversion N/A 06/23/2014    Procedure: TRANSESOPHAGEAL ECHOCARDIOGRAM (TEE);  Surgeon: Lewayne BuntingBrian S Crenshaw, MD;  [facility];  Service: Cardiovascular;  Laterality: N/A;  . Insertion of dialysis catheter Right 06/27/2014    Procedure: INSERTION OF Right Internal Jugular DIATEK CATHETER;  Surgeon: Pryor OchoaJames D Lawson, MD;  [facility];  Service: Vascular;  Laterality: Right;  . Laparotomy N/A 07/01/2014    Procedure: EXPLORATORY LAPAROTOMY WITH CLOSURE OF PERFORATED PYLORIC ULCER;  Surgeon: Claud KelpHaywood Ingram, MD;  Location: [facility] OR;  Service: General;  Laterality: N/A;  . Bowel resection N/A 07/01/2014    Procedure:  SMALL BOWEL RESECTION;  Surgeon: Claud KelpHaywood Ingram, MD;  Location: [facility] OR;  Service: General;  Laterality: N/A;    Allergies: Review of patient's allergies indicates no known allergies.  Medications: Prior to Admission medications   Medication Sig Start Date End Date Taking? Authorizing Provider  allopurinol (ZYLOPRIM) 100 MG tablet Take 100 mg by mouth 2 (two) times daily as needed (for gout).   Yes Historical Provider, MD  citalopram (CELEXA) 20 MG tablet Take 20 mg by mouth daily.   Yes Historical Provider, MD  furosemide (LASIX) 80 MG tablet Take 80 mg by mouth daily.   Yes Historical Provider, MD  hydrOXYzine (ATARAX/VISTARIL) 25 MG tablet Take 25 mg by mouth 3 (three) times daily as needed for anxiety.   Yes Historical Provider, MD  oxyCODONE (ROXICODONE) 5 MG immediate release tablet Take 1 tablet (5 mg total) by mouth every 6 (six) hours as needed for severe pain. Patient not taking: Reported on 06/17/2014 01/28/14   Raymond GurneyKimberly A Trinh, PA-C     Family History  Problem Relation Age of Onset  . Diabetes Mother   . Hypertension Mother   . Heart disease Mother   . Heart attack Mother   . Peripheral vascular disease Mother   . Diabetes Father   . Heart disease Father   . Hypertension Father   . Diabetes Sister   . Hypertension Sister   . Heart disease Sister     before age 59  . Heart attack Sister   . Peripheral vascular disease Sister   . Heart disease Brother   .  Hyperlipidemia Daughter     History   Social History  . Marital Status: Single    Spouse Name: N/A  . Number of Children: N/A  . Years of Education: N/A   Social History Main Topics  . Smoking status: Current Every Day Smoker -- 0.50 packs/day for 20 years    Types: Cigarettes  . Smokeless tobacco: Never Used  . Alcohol Use: Yes     Comment: occ  . Drug Use: No  . Sexual Activity: Not on file   Other Topics Concern  . None   Social History Narrative      Review of Systems: A 12 point ROS  discussed and pertinent positives are indicated in the HPI above.  All other systems are negative.  Review of Systems  Constitutional: Positive for activity change, appetite change, fatigue and unexpected weight change. Negative for fever.  Respiratory: Negative for cough, choking and shortness of breath.   Gastrointestinal: Positive for nausea, abdominal pain and abdominal distention. Negative for vomiting.  Musculoskeletal: Negative for back pain.  Neurological: Positive for weakness.  Psychiatric/Behavioral: Negative for behavioral problems and confusion.    Vital Signs: BP 93/66 mmHg  Pulse 110  Temp(Src) 98.4 F (36.9 C) (Oral)  Resp 24  Ht  (1.676 m)  Wt 88.5 kg (195 lb 1.7 oz)  BMI 31.51 kg/m2  SpO2 95%  Physical Exam  Constitutional: She is oriented to person, place, and time. She appears well-developed.  Cardiovascular: Normal rate and normal heart sounds.   Pulmonary/Chest: Effort normal and breath sounds normal.  Abdominal: Soft. Bowel sounds are normal. She exhibits distension. There is tenderness.  Surgical drain intact RUQ Large midline abd wound from bowel resection---healing  Musculoskeletal: Normal range of motion.  Neurological: She is alert and oriented to person, place, and time.  Skin: Skin is warm and dry.  Psychiatric: She has a normal mood and affect. Her behavior is normal. Thought content normal.  Nursing note and vitals reviewed.   Mallampati Score:  MD Evaluation Airway: WNL Heart: WNL Heart  comments: Intermittent ectopic atrial tachycardia Abdomen: WNL Chest/ Lungs: WNL ASA  Classification: 3 Mallampati/Airway Score: Two  Imaging: Ct Abdomen Pelvis Wo Contrast  07/16/2014   CLINICAL DATA:  Followup abdominal abscess  EXAM: CT ABDOMEN AND PELVIS WITHOUT CONTRAST  TECHNIQUE: Multidetector CT imaging of the abdomen and pelvis was performed following the standard protocol without IV contrast. A small amount of oral contrast was  administered.  COMPARISON:  07/10/2014/07/09/2014  FINDINGS: The lung bases demonstrate minimal left basilar atelectasis. The heart remains enlarged in size.  The liver, gallbladder, spleen, adrenal glands and pancreas are all normal in their CT appearance. A surgical drain is noted in the left upper quadrant stable from the prior exam. A minimal amount of left upper quadrant fluid remains adjacent to the spleen. This is roughly stable from the prior exam. Kidneys are stable in appearance. Mild dilatation of the abdominal aorta is noted and stable.  There is been interval placement of a trans gluteal drainage catheter. The amount of pelvic fluid has decreased significantly although a small rim out remains. It now measures approximately 6.4 x 2.4 cm in greatest dimension. This is decreased from 9.0 x 6.2 cm. Calcified uterine fibroids are again seen. No new focal abnormality is noted. The bladder is well distended. No bony abnormality is seen.  IMPRESSION: Interval reduction in the size of pelvic fluid collection. Correlation with current drainage levels is recommended.  The remainder of the  exam is stable from the prior studies.   Electronically Signed   By: Alcide Clever M.D.   On: 07/16/2014 11:23   Dg Abd 1 View  08/03/2014   CLINICAL DATA:  NG tube placement  EXAM: ABDOMEN - 1 VIEW  COMPARISON:  07/13/2014  FINDINGS: NG tube tip is in the proximal stomach. The side port is near the GE junction.  IMPRESSION: NG tube tip in the proximal stomach.   Electronically Signed   By: Charlett Nose M.D.   On: 08/03/2014 17:18   Dg Abd 1 View  07/13/2014   CLINICAL DATA:  Ileus  EXAM: ABDOMEN - 1 VIEW  COMPARISON:  CT abdomen pelvis dated 07/09/2014  FINDINGS: Nonobstructive bowel gas pattern.  Contrast within distal small bowel in the right lower quadrant, ascending colon, and splenic flexure.  Pigtail drain in the right lower quadrant.  Enteric tube in the proximal gastric body.  IMPRESSION: Nonobstructive bowel gas  pattern.  Residual contrast in the distal small bowel and colon, as above, unchanged from prior CT and suggesting adynamic colonic ileus.  Pigtail drain in the right lower quadrant.   Electronically Signed   By: Charline Bills M.D.   On: 07/13/2014 12:48   Ct Angio Chest Pe W/cm &/or Wo Cm  07/29/2014   CLINICAL DATA:  Shortness of breath. Patients brief history is as follows: "smoker with hx HTN, ESRD on HD (just began this admit), COPD, failed L AV fistula, initially admitted 3/16 with flu and acute hypoxic resp failure. Ultimately tx to [facility] due to need for HD. 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 Patient develops septic shock on 4-1, she was started on pressors and wean off pressors on 4-3. Subsequently she was diagnosed with pelvic abscess by CT scan perform on 4-7. She underwent pelvic drain placement by IR. Patient also develops C. diff diagnosed 4-12. C diff was not responding flagyl, and she was started on vancomycin 4-16. She also develops run of SVT. Cardiology has been helping with management. Course complicated with high RV pressure and concerns for PE; also with fistula formation between anastomosis and blake drain, making her back to NPO state and in need of TPN.  EXAM: CT ANGIOGRAPHY CHEST WITH CONTRAST  TECHNIQUE: Multidetector CT imaging of the chest was performed using the standard protocol during bolus administration of intravenous contrast. Multiplanar CT image reconstructions and MIPs were obtained to evaluate the vascular anatomy.  CONTRAST:  80mL OMNIPAQUE IOHEXOL 350 MG/ML SOLN  COMPARISON:  Chest radiograph, 07/14/2014  FINDINGS: Angiographic study: No evidence of a pulmonary embolus. Aorta is normal in caliber. No dissection.  Thoracic inlet:  No masses or adenopathy.  Mediastinum and hila: Heart is mildly enlarged due to right heart enlargement. No mediastinal  or hilar masses or pathologically enlarged lymph nodes.  Lungs and pleura: No lung consolidation or edema. Mild emphysema. There are minor areas of peripheral reticular scarring and minor dependent subsegmental atelectasis. No pleural effusion or pneumothorax.  Limited upper abdomen: Partly image catheter lies above the proximal stomach. No acute findings in the upper abdomen.  Musculoskeletal:  No osteoblastic or osteolytic lesions.  Review of the MIP images confirms the above findings.  IMPRESSION: 1. No evidence of a pulmonary embolus. 2. No acute findings. 3. Cardiomegaly. 4. Mild emphysema.  No evidence of pneumonia or pulmonary edema.  Electronically Signed   By: Amie Portlandavid  Ormond M.D.   On: 07/29/2014 15:53   Ct Abdomen Pelvis W Contrast  08/04/2014   CLINICAL DATA:  New epigastric abdominal pain.  EXAM: CT ABDOMEN AND PELVIS WITH CONTRAST  TECHNIQUE: Multidetector CT imaging of the abdomen and pelvis was performed using the standard protocol following bolus administration of intravenous contrast.  CONTRAST:  100mL OMNIPAQUE IOHEXOL 300 MG/ML  SOLN  COMPARISON:  07/16/2014  FINDINGS: There is a small volume extraluminal air alongside the JP drain in the anterior abdominal midline. Source of the air is not clear. No other extraluminal air is evident in the abdomen or pelvis. No undrained fluid collection is evident alongside the JP drain. There is a 3.5 x 5.7 x 3.0 cm contained collection in the pelvic cul-de-sac which could represent a small abscess.  There is an open midline abdominal wound without associated drainable collection. There is herniation of abdominal fat through the abdominal wall musculature at the location of the wound.  There are normal appearances of the liver, spleen, pancreas, adrenals and kidneys with the exception of a 1.8 cm simple left renal cyst. There is an unchanged 3 cm infrarenal abdominal aortic aneurysm. The duodenum is draped over the aneurysm but appears intact.  There are  multiple uterine fibroids.  There is no significant abnormality in the lower chest.  IMPRESSION: *Extraluminal air alongside the JP drain in the anterior abdominal midline, etiology not apparent on this scan *3.0 x 3.5 x 5.7 cm collection in the pelvic cul-de-sac *Unchanged 3 cm infrarenal abdominal aortic aneurysm directly adjacent to the duodenum *Open wound in the abdominal midline. No fluid collection near the wound.   Electronically Signed   By: Ellery Plunkaniel R Mitchell M.D.   On: 08/04/2014 02:24   Ct Abdomen Pelvis W Contrast  07/24/2014   CLINICAL DATA:  Abdominal abscess.  EXAM: CT ABDOMEN AND PELVIS WITH CONTRAST  TECHNIQUE: Multidetector CT imaging of the abdomen and pelvis was performed using the standard protocol following bolus administration of intravenous contrast.  CONTRAST:  100mL OMNIPAQUE IOHEXOL 300 MG/ML  SOLN  COMPARISON:  CT scan of July 16, 2014.  FINDINGS: Severe degenerative disc disease is noted at L5-S1. No significant abnormality is noted in the visualized lung bases.  No gallstones are noted. No focal abnormality is noted in the liver, spleen or pancreas. Stable surgical drain is seen entering epigastric region with tip in left upper quadrant. No fluid is noted around this drain. Adrenal glands appear normal. Stable left renal cyst is noted. Mild right perinephric fluid is noted which is unchanged. No hydronephrosis or renal obstruction is noted. No renal or ureteral calculi are noted. Atherosclerotic calcifications of abdominal aorta and iliac arteries are noted. 3.1 cm infrarenal abdominal aortic aneurysm is noted. There is no evidence of bowel obstruction. Urinary bladder appears normal. Calcified degenerating fibroids are noted in the uterus. Ovaries appear normal.  Percutaneous trans gluteal drainage catheter noted on prior exam has been removed in the interval. Three rectal fluid collection identified on prior exam is slightly enlarged currently, measuring 6.6 x 3.4 cm. No  significant adenopathy is noted.  IMPRESSION: 3.1 cm infrarenal abdominal aortic aneurysm is noted. Recommend followup by ultrasound in 3 years. This recommendation follows ACR consensus guidelines: White Paper of the ACR Incidental Findings Committee II on Vascular Findings. J Am Coll Radiol 2013; 10:789-794.  Surgical drain seen entering epigastric region with distal tip in left upper quadrant is unchanged in position. No fluid collection is  noted around it.  Percutaneous transgluteal drainage catheter noted on prior exam has been removed. Pre-rectal fluid collection in pelvis seen on prior exam has slightly increased in size, currently measuring 6.6 x 3.4 cm.   Electronically Signed   By: Lupita Raider, M.D.   On: 07/24/2014 10:41   Nm Pulmonary Perf And Vent  07/25/2014   CLINICAL DATA:  Short of breath.  Dialysis patient.  EXAM: NUCLEAR MEDICINE VENTILATION - PERFUSION LUNG SCAN  TECHNIQUE: Ventilation images were obtained in multiple projections using inhaled aerosol technetium 99 M DTPA. Perfusion images were obtained in multiple projections after intravenous injection of Tc-53m MAA.  RADIOPHARMACEUTICALS:  40 mCi Tc-8m DTPA aerosol and 6 mCi Tc-38m MAA  COMPARISON:  CXR 07/14/2014  FINDINGS: Ventilation: 2 segment perfusion defect left lower lobe is matched with the ventilation study. Patchy ventilation on the right with a subsegmental defect in the right lung base  Perfusion: Normal perfusion of the right lung. Two segment matched perfusion defect left lower lobe.  IMPRESSION: Intermediate probability for pulmonary embolism.   Electronically Signed   By: Marlan Palau M.D.   On: 07/25/2014 14:20   Ir Fluoro Guide Cv Line Left  07/29/2014   CLINICAL DATA:  End-stage renal disease, access for TPN  EXAM: LEFT IJ TUNNELED DOUBLE-LUMEN POWER PICC LINE PLACEMENT WITH ULTRASOUND AND FLUOROSCOPIC GUIDANCE  FLUOROSCOPY TIME:  3 minutes 24 seconds  PROCEDURE: The patient was advised of the possible risks  andcomplications and agreed to undergo the procedure. The patient was then brought to the angiographic suite for the procedure.  The LEFT NECKwas prepped with chlorhexidine, drapedin the usual sterile fashion using maximum barrier technique (cap and mask, sterile gown, sterile gloves, large sterile sheet, hand hygiene and cutaneous antisepsis) and infiltrated locally with 1% Lidocaine.  Ultrasound demonstrated patency of the left internal jugular vein, and this was documented with an image. Under real-time ultrasound guidance, this vein was accessed with a 21 gauge micropuncture needle and image documentation was performed. A 0.018 wire was introduced in to the vein. Under sterile conditions and local anesthesia, a subcutaneous tunnel was created in the left infraclavicular chest. PICC line was tunneled subcutaneously to the venotomy site. Through a peel-away sheath, a cuffed 5 [place] double lumen power PICC was advanced to the lower SVC/right atrial junction. Fluoroscopy during the procedure and fluoro spot radiograph confirms appropriate catheter position. The catheter was flushed and covered with asterile dressing.  Catheter length: 26  Complications: None immediate  IMPRESSION: Successful left internal jugulartunneled double-lumen Power PICC line placement with ultrasound and fluoroscopic guidance. The catheter is ready for use.   Electronically Signed   By: Judie Petit.  Shick M.D.   On: 07/29/2014 13:03   Ir US Guide Vasc Access Left  07/29/2014   CLINICAL DATA:  End-stage renal disease, access for TPN  EXAM: LEFT IJ TUNNELED DOUBLE-LUMEN POWER PICC LINE PLACEMENT WITH ULTRASOUND AND FLUOROSCOPIC GUIDANCE  FLUOROSCOPY TIME:  3 minutes 24 seconds  PROCEDURE: The patient was advised of the possible risks andcomplications and agreed to undergo the procedure. The patient was then brought to the angiographic suite for the procedure.  The LEFT NECKwas prepped with chlorhexidine, drapedin the usual sterile fashion using  maximum barrier technique (cap and mask, sterile gown, sterile gloves, large sterile sheet, hand hygiene and cutaneous antisepsis) and infiltrated locally with 1% Lidocaine.  Ultrasound demonstrated patency of the left internal jugular vein, and this was documented with an image. Under real-time ultrasound guidance, this vein was  accessed with a 21 gauge micropuncture needle and image documentation was performed. A 0.018 wire was introduced in to the vein. Under sterile conditions and local anesthesia, a subcutaneous tunnel was created in the left infraclavicular chest. PICC line was tunneled subcutaneously to the venotomy site. Through a peel-away sheath, a cuffed 5 [place] double lumen power PICC was advanced to the lower SVC/right atrial junction. Fluoroscopy during the procedure and fluoro spot radiograph confirms appropriate catheter position. The catheter was flushed and covered with asterile dressing.  Catheter length: 26  Complications: None immediate  IMPRESSION: Successful left internal jugulartunneled double-lumen Power PICC line placement with ultrasound and fluoroscopic guidance. The catheter is ready for use.   Electronically Signed   By: Judie Petit.  Shick M.D.   On: 07/29/2014 13:03   Dg Sinus/fist Tube Chk-non Gi  07/27/2014   CLINICAL DATA:  Perforated duodenal ulcer with surgical repair. Surgical drain in the upper abdomen. Continued prominent drainage.  EXAM: ABSCESS INJECTION  TECHNIQUE: I injected 30 cc of Omnipaque 300 into the drainage catheter under direct fluoroscopic visualization. Ms. Kaman tolerated the procedure well.  CONTRAST:  30mL OMNIPAQUE IOHEXOL 300 MG/ML  SOLN  FLUOROSCOPY TIME:  Fluoroscopy Time (in minutes and seconds): 0 min 36 seconds  COMPARISON:  CT scan dated 07/24/2014  FINDINGS: There is a 2 cm cavity which appears to lie at the superior aspect of the apex of the duodenal bulb, directly communicating with the drainage catheter. This cavity does communicate with the duodenal  bulb with contrast entering the stomach and duodenum. There is no free intraperitoneal extravasation of contrast.  IMPRESSION: Contained cavity adjacent to the apex of the duodenal bulb which communicates with the drainage catheter. No free intraperitoneal leakage.  Critical Value/emergent results were called by telephone at the time of interpretation on 07/27/2014 at 9:17 Am to [facility] , who verbally acknowledged these results.   Electronically Signed   By: Francene Boyers M.D.   On: 07/27/2014 10:13   Dg Chest Port 1 View  07/15/2014   CLINICAL DATA:  Encounter for central line placement  EXAM: PORTABLE CHEST - 1 VIEW  COMPARISON:  07/14/2014  FINDINGS: Advanced left IJ catheter, tip now at the SVC level. No interval displacement of right IJ dual-lumen catheter.  Stable cardiomegaly and aortic tortuosity.  There is no edema, consolidation, effusion, or pneumothorax.  IMPRESSION: Advanced or replaced left IJ catheter, tip now at the SVC level.   Electronically Signed   By: Marnee Spring M.D.   On: 07/15/2014 01:51   Dg Chest Port 1 View  07/14/2014   CLINICAL DATA:  59 year old female with left-sided pleural effusion. Shortness breath. Hypertension. Chronic kidney disease. Subsequent encounter.  EXAM: PORTABLE CHEST - 1 VIEW  COMPARISON:  07/12/2014.  FINDINGS: Right split catheter in place with tips in the region of the proximal and proximal to mid superior vena cava.  Left internal jugular catheter tip at the level of the distal left brachiocephalic vein just proximal to formation of the superior vena cava.  No gross pneumothorax.  Cardiomegaly.  Central pulmonary vascular prominence.  No segmental consolidation.  Minimally tortuous aorta.  IMPRESSION: Central pulmonary vascular prominence appears stable.  Nasogastric tube removed.  Cardiomegaly.   Electronically Signed   By: Lacy Duverney M.D.   On: 07/14/2014 07:29   Dg Chest Port 1v Same Day  08/03/2014   CLINICAL DATA:  59 year old female  dialysis patient with severe shortness of breath. Respiratory distress. Initial encounter.  EXAM: PORTABLE  CHEST - 1 VIEW SAME DAY  COMPARISON:  Chest CTA 07/29/2014 and earlier.  FINDINGS: Portable AP semi upright view at 1400 hrs. Bilateral IJ approach chest catheters appears stable. Stable lung volumes. Stable cardiomegaly and mediastinal contours. No pneumothorax, pulmonary edema, pleural effusion or consolidation. No acute pulmonary opacity identified.  IMPRESSION: No acute cardiopulmonary abnormality.   Electronically Signed   By: Odessa Fleming M.D.   On: 08/03/2014 14:18    Labs:  CBC:  Recent Labs  08/09/14 0905 08/10/14 0410 08/11/14 1220 08/12/14 0406  WBC 13.4* 16.4* 13.6* 13.2*  HGB 7.2* 7.8* 6.8* 9.0*  HCT 22.0* 24.1* 22.3* 29.1*  PLT 318 357 302 279    COAGS:  Recent Labs  07/01/14 1445 07/01/14 1813 07/02/14 0430 07/03/14 0445 07/04/14 0510 07/07/14 0400 07/10/14 0715  INR 1.44 1.83* 1.51*  --   --   --  1.21  APTT  --   --  35 38* 36 41* 37    BMP:  Recent Labs  08/10/14 0410 08/11/14 0415 08/11/14 1220 08/12/14 0406  NA 127* 131* 127* 132*  K 4.1 3.8 3.9 3.9  CL 79* 95* 92* 97*  CO2 GLUCOSE 97 166* 733* 145*  BUN 143* 54* 11 30*  CALCIUM 8.2* 8.0* 7.0* 7.9*  CREATININE 8.90* 4.40* 1.46* 2.67*  GFRNONAA 4* 10* 39* 19*  GFRAA 5* 12* 45* 22*    LIVER FUNCTION TESTS:  Recent Labs  08/03/14 0500  08/06/14 0410 08/08/14 0530 08/10/14 0410 08/11/14 0415 08/12/14 0406  BILITOT 1.1  --  1.0 1.1 1.0  --   --   AST 29  --  --   --   ALT 17  --  --   --   ALKPHOS 148*  --  160* 161* 170*  --   --   PROT 7.3  --  7.3 6.5 6.6  --   --   ALBUMIN 2.0*  < > 1.9* 1.7* 1.7* 1.6* 1.7*  < > = values in this interval not displayed.  TUMOR MARKERS: No results for input(s): AFPTM, CEA, CA199, CHROMGRNA in the last 8760 hours.  Assessment and Plan:  Hx pyloric ulcer perforation---surgery 07/01/14; small bowel  resection Duodenal fistula Slow healing; PCM Need for long term care Scheduled for gastro-jejunal tube placement in IR Risks and Benefits discussed with the patient including, but not limited to the need for a barium enema during the procedure, bleeding, infection, peritonitis, or damage to adjacent structures. All of the patient's questions were answered, patient is agreeable to proceed. Consent signed and in chart.   Thank you for this interesting consult.  I greatly enjoyed meeting [name] and look forward to participating in their care.  Signed: Gyanna Jarema A 08/12/2014, 10:30 AM   I spent a total of 40 Minutes    in face to face in clinical consultation, greater than 50% of which was counseling/coordinating care for GJ tube placement

## 2014-08-12 NOTE — Progress Notes (Signed)
Nutrition Follow-up  DOCUMENTATION CODES:  Obesity unspecified  INTERVENTION:   TPN; continue dosing per Pharmacy  When G-J tube ready to use, recommend Vital AF 1.2 at 10 ml/h.  NUTRITION DIAGNOSIS:  Inadequate oral intake related to inability to eat as evidenced by NPO status.  Ongoing.  GOAL:  Patient will meet greater than or equal to 90% of their needs  Met.  MONITOR:  Diet advancement, Labs, Weight trends, Skin (TPN tolerance and adequacy)  ASSESSMENT:  Pt with CKD and hypertension, presented on 3/16 with shortness of breath and generalized weakness x 2 weeks. Pt found to have metabolic acidosis from worsening uremia, H1N1 flu. Pt started on HD. 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  Pt s/p exp lap with closure of perforated pyloric ulcer, and small bowel resection. Received TPN until 4/15, then transitioned to a PO diet + supplements. 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 Fistula output 1950 ml x 24 hours. Pt remains NPO. Family and patient want to continue aggressive care.   Patient is receiving TPN with Clinimix 5/15 @ 100 ml/hr (changing to Clinimix E 5/15 today) and lipids @ 10 ml/hr. Provides 2184 kcal, and 120 grams protein per day. Meets 100% minimum estimated energy needs and 100% minimum estimated protein needs.   Labs reviewed. Sodium and phosphorus are low. TPN being changed to a mixture with electrolytes today.  IR to place a G-J tube for post-pyloric feeding and drainage of gastric juices.  Height:  Ht Readings from Last 1 Encounters:  08/02/14 5' 6" (1.676 m)    Weight:  Wt Readings from Last 1 Encounters:  08/11/14 195 lb 1.7 oz (88.5 kg)    Ideal Body Weight:  59.1 kg  Wt Readings from Last 10 Encounters:  08/11/14 195 lb 1.7 oz (88.5 kg)  06/01/14 225 lb (102.059 kg)  01/28/14 230 lb (104.327 kg)  01/21/14 231 lb 1.6 oz (104.826 kg)  10/02/12 216 lb 8 oz  (98.204 kg)    BMI:  Body mass index is 31.51 kg/(m^2).  Estimated Nutritional Needs:  Kcal:  2100-2300   Protein:  120-140 gm  Fluid:  1.2 L  Skin:  Wound (see comment) (stage 2 pressure ulcer to sacrum)  Diet Order:  Diet NPO time specified TPN (CLINIMIX) Adult without lytes TPN (CLINIMIX-E) Adult    Intake/Output Summary (Last 24 hours) at 08/12/14 1202 Last data filed at 08/12/14 0600  Gross per 24 hour  Intake 3893.18 ml  Output   1000 ml  Net 2893.18 ml    Last BM:  5/4   Kimberly Harris, RD, LDN, CNSC Pager [phone] After Hours Pager [phone]  

## 2014-08-12 NOTE — Progress Notes (Signed)
PARENTERAL NUTRITION CONSULT NOTE - FOLLOW UP  Pharmacy Consult for TPN Indication:  High output Duodenal Fistula  No Known Allergies  Patient Measurements: Height: _0  (167.6 cm) Weight: 195 lb 1.7 oz (88.5 kg) IBW/kg (Calculated) : 59.3 Adjusted Body Weight: 71.2kg  Vital Signs: Temp: 98.4 F (36.9 C) (05/11 0800) Temp Source: Oral (05/11 0800) BP: 93/66 mmHg (05/11 0400) Pulse Rate: 110 (05/11 0400) Intake/Output from previous day: 05/10 0701 - 05/11 0700 In: 3893.2 [I.V.:586.7; Blood:670; TPN:2636.5] Out: 1204 [Drains:1000]  Labs:  Recent Labs  08/10/14 0410 08/11/14 1220 08/12/14 0406  WBC 16.4* 13.6* 13.2*  HGB 7.8* 6.8* 9.0*  HCT 24.1* 22.3* 29.1*  PLT 357 302 279    Recent Labs  08/10/14 0410 08/11/14 0400 08/11/14 0415 08/11/14 1220 08/12/14 0406  NA 127*  --  131* 127* 132*  K 4.1  --  3.8 3.9 3.9  CL 79*  --  95* 92* 97*  CO2 26  --  _1 GLUCOSE 97  --  166* 733* 145*  BUN 143*  --  54* 11 30*  CREATININE 8.90*  --  4.40* 1.46* 2.67*  CALCIUM 8.2*  --  8.0* 7.0* 7.9*  MG 2.0 2.0  --   --  1.7  PHOS 8.3*  --  4.6  --  2.2*  PROT 6.6  --   --   --   --   ALBUMIN 1.7*  --  1.6*  --  1.7*  AST 25  --   --   --   --   ALT 15  --   --   --   --   ALKPHOS 170*  --   --   --   --   BILITOT 1.0  --   --   --   --   PREALBUMIN 9.4*  --   --   --   --   TRIG 141  --   --   --   --    Estimated Creatinine Clearance: 25.7 mL/min (by C-G formula based on Cr of 2.67).   Recent Labs  08/11/14 1841 08/11/14 2149 08/12/14 0541  GLUCAP 138* 196* 142*   Insulin Requirements in the past 24 hours:  17 unit Novolog SSI, erratic CBGs, got 1 amp D50 for hypoglycemic event  Current Nutrition:  NPO Clinimix 5/15 (without electrolytes) at 137m/hr and 20% IVFE at 171mhr which will provide 2184 kcal and 120 gm protein.  Nutritional Goals: (per RD note 4/28) 2100-2300 kCal, 120-140 grams of protein per day Goal: Clinimix 5/15 at 10071mr + 20%  IVFE 49m49m to provide 2184 kcal and 120 gm protein  Assessment: 58 Y73 with complicated hospital course. S/p closure perforated pyloric ulcer, SBR for ischemic necrosis, diffuse peritonitis on 07/01/14. Pharmacy consulted to provide TPN 4/1-4/15 for nutrition support for prolonged ileus. Pt then transitioned to po diet + supplements. 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  GI: Duodenal fistula. NPO. Open abd wound, (+)abd pain. Drain output changed from bile to bright red blood overnight on 5/7, now bilious. Drain output 2200 5/7 and 950 cc 9/8 and 350 cc on 9/9. Octreotide drip ordered 5/10 am. .  Albumin 1.7. Prealbumin remains stable and low at 9.4 Noted palliative care discussions ongoing but for now pt/family wish for aggressive care. NGT out and pt refuses replacement.   5/10 add octreotide drip 5/11 IR to place gastro-jenjunal tube in IR for  post-pyloric feeding and drainage of gastric juices  Endo: h/o DM. had low CBGs prior to TPN start. Got 16 units SSI, on IV synthroid. Very erratic CBGs yesterday 733, got 15 units insulin then < 10 requiring D50 1 amp, no insulin in TPN  Lytes: lytes added to TPN Saturday 5/7 and taken out Monday 5/9; -  K+ 3.9 (AFib, goal >= 4)  Has been getting 4K HD bath at HD - Dr. Jonnie Finner wants pharmacy to manage K+. Na low at 132 from dilute TPN.  max Na in HD bath of 145.  Ca 7.9 , CoCa 9.74 Phos 8.3>4.6> 2.2 , CaxPhos = ~ 21, < than 55 as desired. Mag 1.7 (goal for afib > = 2).  Renal: New ESRD - started 3/17. HD to change to TTS. extra HD 5/10 to get phos/solute down.   Aranesp 100 qMon (decr from 150 to 100 on 5/2 2nd Hg 03/05/09.7). Hg 7.8 on 5/9, aranesp dose increased to 200 5/9  Pulm: asthma, COPD. RA. CT negative for PE. CCM signed off 5/3  Cards: HTN. (afib).  Midodrine and amio on hold (no NGT and NPO) IV metoprolol. CHADS-VASc =4 (not good candidate for [facility]). Cards rec restart IV amio if SVT  worsens  Hepatotobil: Alk phos slightly elevated with upward trend, other LFTs wnl. TG 141 (5/9)  Neuro: hx depression / insomnia - new stroke noted on 3/18.   ID: completed PO Vanc D#14/14on 5/7  for cdiff colitis, Diarrhea is better. WBC 13.2 (downward trend)  Best Practices: SCDs, PPI IV  TPN Access: PICC line in IR 4/27 TPN start date: 4/1->4/15; restart 4/27>>  Plan:  -  Change to Clinimix E 5/15 with electrolytes at 100/hr and 20% IVFE at 48m/hr.  Lytes taken out on Monday 5/9 due to elevated CaxPhos product. Lytes added back Wednesday 5/11 due to low phos.  - 10 mM naphos bolus, 2 gm mag bolus -   Trace elements and MVI daily in TPN.  -  Continue sensitive SSI q6h -TPN labs in am  MEudelia Bunch Pharm.D. [phone]/02/2015 10:59 AM

## 2014-08-12 NOTE — Progress Notes (Signed)
[name] [id number] DOB: 02/05/1956 DOA: 06/17/2014 PCP: Willey BladeEAN, [name], MD  Admit HPI / Brief Narrative: 59 yo ? 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 [name], failed L AV fistula, initially admitted 3/16 with flu and acute hypoxic resp failure. Ultimately tx to [facility] due to need for HD L sided weakness 3/18 and found to have R ACA stroke-showed acute infarct R ACA  Patient ? lethargic 3/30 and CT abd=perf viscus 3/31 was taken to the OR for ex lap for perforated pyloric ulcer with diffuse peritonitis, ischemic necrosis of mid ileum. Patient developed septic shock on 4-1, she was started on pressors and wean off pressors on 4-3.  Subsequently she was diagnosed with pelvic abscess by CT scan perform on 4-7. She underwent pelvic drain placement by IR.  C. diff diagnosed 4-12-not responding flagyl, and she was started on vancomycin 4-16.  develops  run of SVT. Cardiology has been helping with management.  Course complicated with high RV pressure ,CT angio negative for PE. - Also with high output fistula formation between anastomosis and drain, making her back to NPO state and in need of TPN. FULL CODE Palliative care consulted and following.  On 5-2 develops hypoxemia, hypotension and SVT during dialysis treatment. Dialysis was stop. She received IV bolus. She was started on BIPAP. She was transfer to Step down unit. CCM and cardiology re consulted.Her Hr decreased, BP improved. She was taken off BIPAP. Now stable, on TNA, off all Abx, continues to have very high output from fistula/leak per CCS   HPI/Subjective: Alert orietned-about to go for IR procedure No specific c/o at this time No sob No cp   Assessment/Plan: Acute hypoxic Respiratory Failure:  -2nd to H1N1, hypervolemia, HCAP >> resolved.  -also has COPD and PAH -improving, now weaned off O2 -Nebulizer PRN and CPAP QHS   Septic shock  -due to perforated prepyloric ulcer,  requiring pressors -resolved  C. difficile colitis -C diff positive on 4-12, Received 4 days of flagyl without improvement, then started on PO vanc -Completed 3 weeks of Oral Vanc-stopped 5/7, this was 1 week after IV Zosyn was stopped 5/5 -Diarrhea resolved  Status post exploratory laparotomy with closure of perforated pyloric ulcer/abdominal abscess / small bowel resection w/ postop ileus and now with fistula -Pelvic abscess s/p percutaneous drain placement 4/8; now with leakage from anastomosis site -Received  Zosyn for 23 days. IV antibiotics discontinued by Surgery on 4/22, also Received 18 days of Vancomycin and fluconazole.  -Due to Fistula is back to NPO state, TPN.  -Fistula study done shows JP drain connection to the duodenal bulb-5-02. repeat CT abdomen with  3.0 x 3.5 x 5.7 cm collection in the pelvic cul-de-sac and extraluminal air alongside JP drain in anterior abd wall -Per CCS, plan of repeat CT next week -Drain with old and new blood, Hb trending down, transfuse 2 unit PRBC 5/10 -continues to have high output from Fistula/leak ?-Had 2188f Coax DL G_J placed, tip lig Treitz placed by IR 08/12/14 at request of gen surgery  Hypokalemia;  -replace in TNA Current order  - Change to Clinimix E 5/15 with electrolytes at 100/hr and 20% IVFE at 1810ml/hr. Lytes taken out on Monday 5/9 due to elevated CaxPhos product. Lytes added back Wednesday 5/11 due to low phos.  - 10 mM naphos bolus, 2 gm mag bolus - Trace elements and MVI daily in TPN.  - Continue sensitive SSI q6h -  TPN labs in am  PSVT/concerns for pulmonary embolism given extremely high heart right side pressure -Has been evaluated by EP, no further episodes for 48h -Cardiology following peripherally- follow K+ and Mg  -Continue IV metoprolol, now off amiodarone;  -V-Q scan with intermediate probability for pulmonary embolism.  -CTA 4/27, negative for PE   Small PFO Has been evaluated by Cardiology  ACA CVA with  Lt sided weakness -TEE ;normal LV function; no LAA thrombus -Started on ASA 81mg  per NEuro, now NPO -Will need ongoing long term rehab - PT/OT to cont to follow while inpatient -Patient remains very weak and deconditioned .   ESRD new start HD this admit  AKi on CKD 4, due to sepsis, shock Ongoing hemodialysis per Nephrology - perm-cath placed 3/26 d/t AVF problems Patient to have AV fistula reviewed at some point for permanent access (most likely in outpatient setting) .  -she was unable to complete dialysis treatment 5-2 due to hypotension, SVT, stable since.   Chronic diastolic congestive heart failure w/ Severe RHF EF 60-65%, grade 1 diastolic dysfunction. Appears compensated.  Fluids/volume managed with hemodialysis.  Elevated PA pressures on 2-D echo; CTA negative for PE  Anemia of critical illness/chronic disease -and bleeding from leak/fistula -s/p 2U PRBC 4/13  -Hb was trending down 6.8 today Transfuse 2 u PRBC 5/10 Aranesp/iron per renal  Thrombocytopenia; resolved.  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 On TPN now, CBGs  stabilizedin 120-160s Hbaic 6.5  Abnormal TSH -Free T4 WNL, T3 1.8 -most likely sick thyroid with ongoing infection and body stress; also due to amiodarone use. TSH in 7 range -continue low dose synthroid given PSVT, now on IV synthroid   L Renal mass  -noted on US, Indeterminate 1.9 cm hypoechoic mass off the interpolar region of the left kidney -may potentially represent a cyst however solid mass is not excluded -needs  Outpatient FU of this  Obesity - Inadequate oral intake related to altered GI function as evidenced by limited intake, ongoing.  Body mass index is 31.51 kg/(m^2). -patient weight trending  down -patient unable to tolerate diet and with leaking fistula  Code Status: FULL Family Communication: discussed with patient alone Disposition Plan: VERY POOR PROGNOSIS, UNREALISTIC EXPECTATIONS, GIVEN AKI/ESRD, ONGOING SURGICAL ISSUES, NPO, appreciate PALLIATIVE FOLLOWING  Consultants: Cardiology Nephrology PCCM Gen Surgery  Palliative care  Significant Events: 3/16 influenza A+ 3/18 R ACA stroke 3/19 MRI multifocal acute infarction of both hemispheres, large right distal ACA 3/22 TEE severe RAE, severely reduced RV function, severe TR, no LAA thrombus, small PFO on TEE 3/31 to OR ex lap for per pyloric ulcer and sm bowel resection 4/1 septic shock on pressors 4/3 pressors weaned off  4/4 restart pressors, CRRT 4/6 levo switched to neo 4/7 pelvic abscess on CT 4/8 IR placed pelvic drain 4/9 off pressors 4/11 long run of SVT. 4/23 VQ scan which demonstrated intermediate probability for PE 4/24 CT angiogram of her chest: Pending/unable to be done due to lack of access   Antibiotics: Zosyn >stopped on 4/22 (received a total of 23 days of treatment ) Vancomycin 4-16  DVT prophylaxis: SCDs  Objective: Blood pressure 93/66, pulse 110, temperature 98.4 F (36.9 C), temperature source Oral, resp. rate 24, height 5\' 6"  (1.676 m), weight 88.5 kg (195 lb  1.7 oz), SpO2 96 %.  Intake/Output Summary (Last 24 hours) at 08/12/14 1348 Last data filed at 08/12/14 0600  Gross per 24 hour  Intake 3893.18 ml  Output   1000 ml  Net 2893.18 ml   Exam: General: AAOx3, chronically ill appearing, no distress Lungs: poor air movement, HD catheter Cardiovascular: Regular rate, no murmurs and no gallops, sinus tachycardia  Abdomen: Abdominal wound dressing, clean and dry; abdomen slightly distended, drain intact and with /green drainage; mild tenderness     Data Reviewed: Basic Metabolic Panel:  Recent Labs Lab 08/06/14 0410  08/07/14 0805 08/08/14 0530  08/09/14 1115  08/10/14 0410 08/11/14 0400 08/11/14 0415 08/11/14 1220 08/12/14 0406  NA 135  < >  --  130*  < > 128* 127*  --  131* 127* 132*  K 3.3*  < >  --  3.1*  < > 3.9 4.1  --  3.8 3.9 3.9  CL 93*  < >  --  86*  < > 87* 79*  --  95* 92* 97*  CO2 30  < >  --  31  < > 25 26  --  GLUCOSE 134*  < >  --  136*  < > 371* 97  --  166* 733* 145*  BUN 41*  < >  --  83*  < > 104* 143*  --  54* 11 30*  CREATININE 4.18*  < >  --  6.06*  < > 6.73* 8.90*  --  4.40* 1.46* 2.67*  CALCIUM 8.6*  < >  --  8.2*  < > 7.3* 8.2*  --  8.0* 7.0* 7.9*  MG 2.0  --   --   --   --   --  2.0 2.0  --   --  1.7  PHOS 1.8*  --  2.2* 3.6  --   --  8.3*  --  4.6  --  2.2*  < > = values in this interval not displayed.  Liver Function Tests:  Recent Labs Lab 08/06/14 0410 08/08/14 0530 08/10/14 0410 08/11/14 0415 08/12/14 0406  AST --   --   ALT --   --   ALKPHOS 160* 161* 170*  --   --   BILITOT 1.0 1.1 1.0  --   --   PROT 7.3 6.5 6.6  --   --   ALBUMIN 1.9* 1.7* 1.7* 1.6* 1.7*   CBC:  Recent Labs Lab 08/08/14 1500 08/09/14 0905 08/10/14 0410 08/11/14 1220 08/12/14 0406  WBC 13.9* 13.4* 16.4* 13.6* 13.2*  NEUTROABS  --   --  11.9*  --   --   HGB 7.9* 7.2* 7.8* 6.8* 9.0*  HCT 25.2* 22.0* 24.1* 22.3* 29.1*  MCV 88.7 86.6 88.0 93.3 88.4  PLT 314 318 357 302 279    CBG:  Recent Labs Lab 08/11/14 1745 08/11/14 1841 08/11/14 2149 08/12/14 0541 08/12/14 1145  GLUCAP 57* 138* 196* 142* 160*    Scheduled Meds:  Scheduled Meds: . antiseptic oral rinse  7 mL Mouth Rinse q12n4p  . budesonide (PULMICORT) nebulizer solution  0.25 mg Nebulization BID  .  ceFAZolin (ANCEF) IV  2 g Intravenous to XRAY  . chlorhexidine  15 mL Mouth Rinse BID  . darbepoetin (ARANESP) injection - DIALYSIS  200 mcg Intravenous Q Mon-HD  . ferric gluconate (FERRLECIT/NULECIT) IV  125 mg Intravenous Q M,W,F-HD  . insulin aspart  0-9 Units  Subcutaneous 4 times per day  . levothyroxine  12.5 mcg  Intravenous Daily  . magnesium sulfate 1 - 4 g bolus IVPB  2 g Intravenous Once  . metoprolol  2.5 mg Intravenous 3 times per day  . sodium chloride  10-40 mL Intracatheter Q12H  . sodium phosphate  Dextrose 5% IVPB  10 mmol Intravenous Once    Time spent on care of this patient: 55 minutes   Pleas Koch, MD [facility] ([phone]   If 7PM-7AM, please contact night-coverage [email or web address] Password [email or web address] 08/12/2014, 1:48 PM   LOS: 56 days

## 2014-08-12 NOTE — Progress Notes (Signed)
Inpatient Diabetes Program Recommendations  AACE/ADA: New Consensus Statement on Inpatient Glycemic Control (2013)  Target Ranges:  Prepandial:   less than 140 mg/dL      Peak postprandial:   less than 180 mg/dL (1-2 hours)      Critically ill patients:  140 - 180 mg/dL   Reason for Assessment:  Results for [name], [name] (MRN [id number]) as of 08/12/2014 12:05  Ref. Range 08/11/2014 05:17 08/11/2014 12:13 08/11/2014 16:45 08/11/2014 17:10 08/11/2014 17:45 08/11/2014 18:41 08/11/2014 21:49 08/12/2014 05:41  Glucose-Capillary Latest Ref Range: 70-99 mg/dL 409158 (H) >811>600 (HH) <91<10 (LL) 80 57 (L) 138 (H) 196 (H) 142 (H)    Note that glucose went up to >600 yesterday and then glucose dropped to <10.    Due to renal involvement, consider making Novolog correction insulin less aggressive and custom. Novolog: 151-200 mg/dL-1 unit 478-295201-250 mg/dL-2 units 621-308251-300 mg/dL-3 units [id number] mg/dL-4 units 962-952351-400 mg/dL- 5  Units   Will follow.  Thanks, Beryl MeagerJenny Amish Mintzer, RN, BC-ADM Inpatient Diabetes Coordinator Pager [phone] (8a-5p)

## 2014-08-12 NOTE — Progress Notes (Signed)
OT Cancellation Note  Patient Details Name: [name] MRN: [id number] DOB: [birth year]   Cancelled Treatment:    Reason Eval/Treat Not Completed: Patient at procedure or test/ unavailable. Acute OT to follow up as available to address goals.   Nena JordanMiller, Diogo Anne M   Carney LivingLeeAnn Marie Deloras Reichard, OTR/L Occupational Therapist [phone] (pager)  08/12/2014, 3:16 PM

## 2014-08-12 NOTE — Procedures (Signed)
7412f Coax DL G_J placed, tip lig Treitz No complication No blood loss. [facility].

## 2014-08-12 NOTE — Progress Notes (Signed)
S: Wants ice chips O:BP 93/66 mmHg  Pulse 110  Temp(Src) 98.4 F (36.9 C) (Oral)  Resp 24  Ht 5\' 6"  (1.676 m)  Wt 88.5 kg (195 lb 1.7 oz)  BMI 31.51 kg/m2  SpO2 95%  Intake/Output Summary (Last 24 hours) at 08/12/14 0936 Last data filed at 08/12/14 0600  Gross per 24 hour  Intake 3893.18 ml  Output   1204 ml  Net 2689.18 ml   Weight change: 0.8 kg (1 lb 12.2 oz) ZOX:WRUEAGen:awake and alert CVS: Sl tachy reg Resp:Scattered wheezes Abd: RUQ drain.  Abd bandaged Ext: no edema.  LUA AVF + bruit NEURO: Decreased strength in Lt upper and lower ext. Ox3 No asterixis Rt IJ permcath   . antiseptic oral rinse  7 mL Mouth Rinse q12n4p  . budesonide (PULMICORT) nebulizer solution  0.25 mg Nebulization BID  . chlorhexidine  15 mL Mouth Rinse BID  . darbepoetin (ARANESP) injection - DIALYSIS  200 mcg Intravenous Q Mon-HD  . ferric gluconate (FERRLECIT/NULECIT) IV  125 mg Intravenous Q M,W,F-HD  . insulin aspart  0-9 Units Subcutaneous 4 times per day  . levothyroxine  12.5 mcg Intravenous Daily  . metoprolol  2.5 mg Intravenous 3 times per day  . sodium chloride  10-40 mL Intracatheter Q12H   No results found. BMET    Component Value Date/Time   NA 132* 08/12/2014 0406   NA 139 10/02/2012 1046   K 3.9 08/12/2014 0406   K 4.4 10/02/2012 1046   CL 97* 08/12/2014 0406   CO2 27 08/12/2014 0406   CO2 25 10/02/2012 1046   GLUCOSE 145* 08/12/2014 0406   GLUCOSE 110 10/02/2012 1046   BUN 30* 08/12/2014 0406   BUN 35.2* 10/02/2012 1046   CREATININE 2.67* 08/12/2014 0406   CREATININE 2.5* 10/02/2012 1046   CALCIUM 7.9* 08/12/2014 0406   CALCIUM 9.8 10/02/2012 1046   GFRNONAA 19* 08/12/2014 0406   GFRAA 22* 08/12/2014 0406   CBC    Component Value Date/Time   WBC 13.2* 08/12/2014 0406   WBC 6.8 10/02/2012 1046   RBC 3.29* 08/12/2014 0406   RBC 4.87 10/02/2012 1046   HGB 9.0* 08/12/2014 0406   HGB 13.1 10/02/2012 1046   HCT 29.1* 08/12/2014 0406   HCT 40.3 10/02/2012 1046   PLT  279 08/12/2014 0406   PLT 359 10/02/2012 1046   MCV 88.4 08/12/2014 0406   MCV 82.6 10/02/2012 1046   MCH 27.4 08/12/2014 0406   MCH 26.8 10/02/2012 1046   MCHC 30.9 08/12/2014 0406   MCHC 32.5 10/02/2012 1046   RDW 19.3* 08/12/2014 0406   RDW 17.4* 10/02/2012 1046   LYMPHSABS 2.0 08/10/2014 0410   LYMPHSABS 1.9 10/02/2012 1046   MONOABS 1.8* 08/10/2014 0410   MONOABS 0.8 10/02/2012 1046   EOSABS 0.5 08/10/2014 0410   EOSABS 0.2 10/02/2012 1046   BASOSABS 0.2* 08/10/2014 0410   BASOSABS 0.1 10/02/2012 1046     Assessment: 1. CVA with Lt hemiparesis 2. Perf pyloric ulcer SP Exp lap.  Now with doudenal fistula 3. C diff 4. Mild hyponatremia 5. Anemia on aranesp 6 Sec HPTH last pth was march. Not on Vit D 7. HypoPO4   Plan: 1.  Plan HD tomorrow and change to TTS schedule for now 2. Check PTH 3. Increase PO4 TNA   Francheska Villeda T

## 2014-08-12 NOTE — Progress Notes (Signed)
[facility] Progress Note  42 Days Post-Op  Subjective: Pt feels better today, c/o minimal pain, no N/V.  Wants ice.  Having flatus.  Last BM was 07/31/14.  Drain put out 1000mL greenish output.  Suction is hooked up to the blake drain so that suction is more efficient.    Objective: Vital signs in last 24 hours: Temp:  [97.7 F (36.5 C)-98.7 F (37.1 C)] 98.4 F (36.9 C) (05/11 0800) Pulse Rate:  [102-119] 110 (05/11 0400) Resp:  [14-25] 24 (05/11 0400) BP: (77-160)/(42-116) 93/66 mmHg (05/11 0400) SpO2:  [90 %-100 %] 95 % (05/11 0819) Weight:  [88.5 kg (195 lb 1.7 oz)] 88.5 kg (195 lb 1.7 oz) (05/10 1125) Last BM Date: 07/31/14  Intake/Output from previous day: 05/10 0701 - 05/11 0700 In: 3893.2 [I.V.:586.7; Blood:670; TPN:2636.5] Out: 1204 [Drains:1000] Intake/Output this shift:    PE: Gen:  Alert, NAD, pleasant Card:  RRR, no M/G/R heard Pulm:  CTA, no W/R/R Abd: Soft, NT/ND, +BS, no HSM, midline wound mostly clean with some slough at the base, good granulation tissue formation, drain with green bilious output 106400mL/24hr.    Lab Results:   Recent Labs  08/11/14 1220 08/12/14 0406  WBC 13.6* 13.2*  HGB 6.8* 9.0*  HCT 22.3* 29.1*  PLT 302 279   BMET  Recent Labs  08/11/14 1220 08/12/14 0406  NA 127* 132*  K 3.9 3.9  CL 92* 97*  CO2 25 27  GLUCOSE 733* 145*  BUN 11 30*  CREATININE 1.46* 2.67*  CALCIUM 7.0* 7.9*   PT/INR No results for input(s): LABPROT, INR in the last 72 hours. CMP     Component Value Date/Time   NA 132* 08/12/2014 0406   NA 139 10/02/2012 1046   K 3.9 08/12/2014 0406   K 4.4 10/02/2012 1046   CL 97* 08/12/2014 0406   CO2 27 08/12/2014 0406   CO2 25 10/02/2012 1046   GLUCOSE 145* 08/12/2014 0406   GLUCOSE 110 10/02/2012 1046   BUN 30* 08/12/2014 0406   BUN 35.2* 10/02/2012 1046   CREATININE 2.67* 08/12/2014 0406   CREATININE 2.5* 10/02/2012 1046   CALCIUM 7.9* 08/12/2014 0406   CALCIUM 9.8 10/02/2012 1046    PROT 6.6 08/10/2014 0410   PROT 8.4* 10/02/2012 1046   ALBUMIN 1.7* 08/12/2014 0406   ALBUMIN 3.1* 10/02/2012 1046   AST 25 08/10/2014 0410   AST 9 10/02/2012 1046   ALT 15 08/10/2014 0410   ALT <6 Repeated and Verified 10/02/2012 1046   ALKPHOS 170* 08/10/2014 0410   ALKPHOS 108 10/02/2012 1046   BILITOT 1.0 08/10/2014 0410   BILITOT 0.21 10/02/2012 1046   GFRNONAA 19* 08/12/2014 0406   GFRAA 22* 08/12/2014 0406   Lipase  No results found for: LIPASE     Studies/Results: No results found.  Anti-infectives: Anti-infectives    Start     Dose/Rate Route Frequency Ordered Stop   08/04/14 1200  vancomycin (VANCOCIN) 50 mg/mL oral solution 125 mg  Status:  Discontinued     125 mg Oral 4 times per day 08/04/14 0851 08/08/14 1229   07/18/14 1800  vancomycin (VANCOCIN) 50 mg/mL oral solution 125 mg  Status:  Discontinued     125 mg Oral 4 times per day 07/18/14 1455 08/04/14 0845   07/17/14 1830  vancomycin (VANCOCIN) IVPB 750 mg/150 ml premix     750 mg 150 mL/hr over 60 Minutes Intravenous  Once 07/17/14 1818 07/18/14 0024   07/15/14 1400  metroNIDAZOLE (FLAGYL)  tablet 500 mg  Status:  Discontinued     500 mg Oral 3 times per day 07/15/14 1202 07/18/14 1533   07/13/14 2200  piperacillin-tazobactam (ZOSYN) IVPB 2.25 g  Status:  Discontinued     2.25 g 100 mL/hr over 30 Minutes Intravenous 3 times per day 07/13/14 1318 07/24/14 1138   07/13/14 2000  fluconazole (DIFLUCAN) IVPB 200 mg  Status:  Discontinued     200 mg 100 mL/hr over 60 Minutes Intravenous Every 24 hours 07/13/14 1318 07/19/14 1027   07/07/14 1200  vancomycin (VANCOCIN) IVPB 1000 mg/200 mL premix  Status:  Discontinued     1,000 mg 200 mL/hr over 60 Minutes Intravenous Every 24 hours 07/07/14 0937 07/14/14 1712   07/06/14 2000  fluconazole (DIFLUCAN) IVPB 400 mg  Status:  Discontinued     400 mg 100 mL/hr over 120 Minutes Intravenous Every 24 hours 07/06/14 1507 07/13/14 1318   07/06/14 1800   piperacillin-tazobactam (ZOSYN) IVPB 2.25 g  Status:  Discontinued     2.25 g 100 mL/hr over 30 Minutes Intravenous 4 times per day 07/06/14 1505 07/13/14 1318   07/05/14 2000  fluconazole (DIFLUCAN) IVPB 200 mg  Status:  Discontinued     200 mg 100 mL/hr over 60 Minutes Intravenous Every 24 hours 07/05/14 0757 07/06/14 1507   07/05/14 1400  piperacillin-tazobactam (ZOSYN) IVPB 2.25 g  Status:  Discontinued     2.25 g 100 mL/hr over 30 Minutes Intravenous 3 times per day 07/05/14 0749 07/06/14 1505   07/02/14 1800  vancomycin (VANCOCIN) IVPB 1000 mg/200 mL premix  Status:  Discontinued     1,000 mg 200 mL/hr over 60 Minutes Intravenous Every 24 hours 07/01/14 1858 07/05/14 0801   07/01/14 2200  piperacillin-tazobactam (ZOSYN) IVPB 3.375 g  Status:  Discontinued     3.375 g 100 mL/hr over 30 Minutes Intravenous 4 times per day 07/01/14 1858 07/05/14 0749   07/01/14 2000  fluconazole (DIFLUCAN) IVPB 400 mg  Status:  Discontinued     400 mg 100 mL/hr over 120 Minutes Intravenous Every 24 hours 07/01/14 1858 07/05/14 0757   07/01/14 1400  fluconazole (DIFLUCAN) IVPB 200 mg  Status:  Discontinued     200 mg 100 mL/hr over 60 Minutes Intravenous Every 24 hours 07/01/14 1330 07/01/14 1853   07/01/14 1000  piperacillin-tazobactam (ZOSYN) IVPB 2.25 g  Status:  Discontinued     2.25 g 100 mL/hr over 30 Minutes Intravenous Every 8 hours 07/01/14 0952 07/01/14 1853   07/01/14 1000  vancomycin (VANCOCIN) 500 mg in sodium chloride 0.9 % 100 mL IVPB     500 mg 100 mL/hr over 60 Minutes Intravenous  Once 07/01/14 0952 07/01/14 1126   06/30/14 1200  vancomycin (VANCOCIN) IVPB 1000 mg/200 mL premix     1,000 mg 200 mL/hr over 60 Minutes Intravenous  Once 06/30/14 0944 06/30/14 1403   06/30/14 1200  ceFEPIme (MAXIPIME) 2 g in dextrose 5 % 50 mL IVPB     2 g 100 mL/hr over 30 Minutes Intravenous  Once 06/30/14 0944 06/30/14 1425   06/30/14 0100  vancomycin (VANCOCIN) 2,000 mg in sodium chloride 0.9 %  500 mL IVPB     2,000 mg 250 mL/hr over 120 Minutes Intravenous  Once 06/30/14 0048 06/30/14 0525   06/30/14 0100  ceFEPIme (MAXIPIME) 2 g in dextrose 5 % 50 mL IVPB     2 g 100 mL/hr over 30 Minutes Intravenous  Once 06/30/14 0048 06/30/14 0322   06/27/14 0600  cefUROXime (ZINACEF) 1.5 g in dextrose 5 % 50 mL IVPB     1.5 g 100 mL/hr over 30 Minutes Intravenous On call to O.R. 06/26/14 1019 06/27/14 0630   06/19/14 1800  oseltamivir (TAMIFLU) capsule 30 mg     30 mg Oral Every M-W-F (1800) 06/19/14 1003 06/22/14 1841   06/18/14 1600  oseltamivir (TAMIFLU) capsule 30 mg  Status:  Discontinued     30 mg Oral Daily 06/18/14 1538 06/19/14 1003       Assessment/Plan POD #42 s/p Exploratory Laparotomy with graham patch closure of perforated pyloric ulcer, SBR for ischemic bowel - 07/01/2014 - Derrell LollingIngram  Intra-abdominal fluid collection, no drain  Now with duodenal fistula  -CT 08/03/14 showed 3.5x5.7x3.0cm pre-rectal fluid collection. No drain was replaced in this pelvic fluid collection after previous drain accidentally pulled out  -Fistula study done and shows JP drain near graham patch with connection to the duodenal bulb.  -Strict NPO, TPN, refusing NGT, output is bilious. Sandostatin seemed to be helping, but had significantly higher output today (?due to being off suction yesterday in dialysis?)   -Wound vac discontinued, now on NS WD dressing changes, switch to TID until cleaner then resume BID changes  -I&Os and routine drain care  -IR consulted about possible GJ tube, We talked to Dr. Loreta AveWagner yesterday.  No note from IR yet.  This would allow us to feed her enterally beyond the fistula/leak if possible.  VTE prophylaxis-SCDs  ID--Zosyn stopped on day #24 (07/24/14). WBC 13.2. May need repeat CT of abdomen to evaluate lower abdominal fluid collection  C diff colitis New ESRD  PCM/TNA      LOS: 56 days    DORT, Precious Gilchrest 08/12/2014, 9:10 AM Pager: [phone]

## 2014-08-12 NOTE — Progress Notes (Signed)
Daily Progress Note   Patient Name: [name]       Date: 08/12/2014 DOB: [birth year]  Age: 59 y.o. MRN#: [id number] Attending Physician: Rhetta MuraJai-Gurmukh Samtani, MD Primary Care Physician: Willey BladeEAN, [name], MD Admit Date: 06/17/2014  Reason for Consultation/Follow-up: Establishing goals of care  Subjective: [name] seems more agitated and upset today. She was discussing G-J tube placement for gastric drainage and also feeding below her fistula. She is upset by this and says "I haven't even healed and they want to cut on me again." We discussed the reason for the procedure and that the purpose is to try and give her body better resources to promote healing. She seems to better understand but is still frustrated. I tried to reassure her that she has choices and does not have to continue with aggressive treatment but she still is not wanting/ready for the alternative of comfort care.    Length of Stay: 56 days  Current Medications: Scheduled Meds:  . antiseptic oral rinse  7 mL Mouth Rinse q12n4p  . budesonide (PULMICORT) nebulizer solution  0.25 mg Nebulization BID  . ceFAZolin      . chlorhexidine  15 mL Mouth Rinse BID  . darbepoetin (ARANESP) injection - DIALYSIS  200 mcg Intravenous Q Mon-HD  . fentaNYL      . ferric gluconate (FERRLECIT/NULECIT) IV  125 mg Intravenous Q M,W,F-HD  . glucagon (human recombinant)      . insulin aspart  0-9 Units Subcutaneous 4 times per day  . levothyroxine  12.5 mcg Intravenous Daily  . lidocaine      . magnesium sulfate 1 - 4 g bolus IVPB  2 g Intravenous Once  . metoprolol  2.5 mg Intravenous 3 times per day  . midazolam      . sodium chloride  10-40 mL Intracatheter Q12H  . sodium phosphate  Dextrose 5% IVPB  10 mmol Intravenous Once    Continuous Infusions: . sodium chloride 10 mL/hr at 08/10/14 2300  . TPN (CLINIMIX) Adult without lytes 100 mL/hr at 08/12/14 0600   And  . fat emulsion 240 mL (08/12/14 0600)  . Marland Kitchen.TPN (CLINIMIX-E) Adult     And  . fat emulsion    . octreotide  (SANDOSTATIN)    IV infusion 50 mcg/hr (08/12/14 0600)    PRN Meds: bisacodyl, camphor-menthol, diphenhydrAMINE, diphenhydrAMINE-zinc acetate, fentaNYL, glucagon, HYDROmorphone (DILAUDID) injection, levalbuterol, midazolam, ondansetron (ZOFRAN) IV, sodium chloride  Palliative Performance Scale: 40%     Vital Signs: BP 108/70 mmHg  Pulse 108  Temp(Src) 98.4 F (36.9 C) (Oral)  Resp 20  Ht 5\' 6"  (1.676 m)  Wt 88.5 kg (195 lb 1.7 oz)  BMI 31.51 kg/m2  SpO2 93% SpO2: SpO2: 93 % O2 Device: O2 Device: Not Delivered O2 Flow Rate: O2 Flow Rate (L/min): 3 L/min  Intake/output summary:  Intake/Output Summary (Last 24 hours) at 08/12/14 1523 Last data filed at 08/12/14 0600  Gross per 24 hour  Intake 3893.18 ml  Output   1000 ml  Net 2893.18 ml   LBM:  5/7 Baseline Weight: Weight: 103.3 kg (227 lb 11.8 oz) Most recent weight: Weight: 88.5 kg (195 lb 1.7 oz)  Physical Exam: Gen: Sitting up in bed, NAD HEENT: [place]/AT, no JVD, moist mucous membranes CV: Tachycardia Pulm: No labored breathing, symmetric Abd: Tender to palpation, dressings CDI, soft Neuro: Awake, alert, oriented x 3              Additional Data Reviewed: Recent Labs  08/11/14  1220  08/12/14  0406  WBC  13.6*  13.2*  HGB  6.8*  9.0*  PLT  302  279  NA  127*  132*  BUN  11  30*  CREATININE  1.46*  2.67*     Problem List:  Patient Active Problem List   Diagnosis Date Noted  . Duodenal anastomotic leak   . Protein calorie malnutrition   . SVT (supraventricular tachycardia)   . Right heart failure   . ESRD (end stage renal disease)   . Abdominal pain   . Palliative care encounter   . 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 Fistula   . History of intermediate V/Q scan   . Other emphysema   . Intra-abdominal abscess   . Abnormal TSH   . Enteritis due to Clostridium difficile 07/19/2014  . Abdominal abscess   . Pelvic fluid collection   .  Perforated gastric ulcer 07/01/2014  . SOB (shortness of breath)   . PJRT (permanent junctional reciprocating tachycardia)   . Paroxysmal atrial tachycardia   . PSVT (paroxysmal supraventricular tachycardia)   . Stroke   . Hyperlipidemia   . 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 Hypertension 06/20/2014  . ESRD needing dialysis 06/20/2014  . Obesity (BMI 30-39.9) 06/20/2014  . Depression 06/20/2014  . Left renal mass 06/20/2014  . Chronic diastolic heart failure 06/20/2014  . H1N1 influenza 06/20/2014  . Tobacco use disorder 06/20/2014  . ARF (acute renal failure)   . Renal failure (ARF), acute on chronic   . Acute respiratory failure with hypoxia 06/17/2014  . End stage renal disease 01/21/2014     Palliative Care Assessment & Plan    Code Status:  Full code  Goals of Care:  Continue aggressive care.   Desire for further Chaplaincy support: yes  3. Symptom Management:  Abd pain: Continue dilaudid 1-2 mg IV every 3 hours prn. Only requiring this 2-4 times a day.   Itching: Continue Benadryl prn. Sarna lotion ordered prn.   Frequent oral care and mouth swabs for moisture and comfort.    4. Prognosis: Very poor.   5. Discharge Planning: To be determined.    Thank you for allowing the Palliative Medicine Team to assist in the care of this patient.   Time In: 1010 Time Out: 1030 Total Time 20min Prolonged Time Billed  no     Greater than 50%  of this time was spent counseling and coordinating care related to the above assessment and plan.   Ulice BoldAlicia C Merland Holness, NP  08/12/2014, 3:23 PM  Please contact Palliative Medicine Team phone at [phone] for questions and concerns.      Yong ChannelAlicia Sherial Ebrahim, NP Palliative Medicine Team Pager # [phone] (M-F 8a-5p) Team Phone # [phone] (Nights/Weekends)

## 2014-08-13 ENCOUNTER — Encounter (HOSPITAL_COMMUNITY): Payer: Self-pay | Admitting: Physician Assistant

## 2014-08-13 ENCOUNTER — Inpatient Hospital Stay (HOSPITAL_COMMUNITY): Payer: Medicaid Other

## 2014-08-13 DIAGNOSIS — E46 Unspecified protein-calorie malnutrition: Secondary | ICD-10-CM | POA: Insufficient documentation

## 2014-08-13 DIAGNOSIS — I719 Aortic aneurysm of unspecified site, without rupture: Secondary | ICD-10-CM

## 2014-08-13 LAB — COMPREHENSIVE METABOLIC PANEL
ALT: 14 U/L (ref 14–54)
AST: 23 U/L (ref 15–41)
Albumin: 1.7 g/dL — ABNORMAL LOW (ref 3.5–5.0)
Alkaline Phosphatase: 170 U/L — ABNORMAL HIGH (ref 38–126)
Anion gap: 13 (ref 5–15)
BUN: 62 mg/dL — AB (ref 6–20)
CO2: 22 mmol/L (ref 22–32)
Calcium: 8.3 mg/dL — ABNORMAL LOW (ref 8.9–10.3)
Chloride: 94 mmol/L — ABNORMAL LOW (ref 101–111)
Creatinine, Ser: 4.25 mg/dL — ABNORMAL HIGH (ref 0.44–1.00)
GFR calc non Af Amer: 11 mL/min — ABNORMAL LOW (ref >60)
GFR, EST AFRICAN AMERICAN: 12 mL/min — AB (ref >60)
Glucose, Bld: 129 mg/dL — ABNORMAL HIGH (ref 65–99)
Potassium: 4.2 mmol/L (ref 3.5–5.1)
Sodium: 129 mmol/L — ABNORMAL LOW (ref 135–145)
TOTAL PROTEIN: 6.9 g/dL (ref 6.5–8.1)
Total Bilirubin: 0.8 mg/dL (ref 0.3–1.2)

## 2014-08-13 LAB — CBC
HEMATOCRIT: 29.3 % — AB (ref 36.0–46.0)
HEMOGLOBIN: 9.1 g/dL — AB (ref 12.0–15.0)
MCH: 27.4 pg (ref 26.0–34.0)
MCHC: 31.1 g/dL (ref 30.0–36.0)
MCV: 88.3 fL (ref 78.0–100.0)
Platelets: 288 10*3/uL (ref 150–400)
RBC: 3.32 MIL/uL — ABNORMAL LOW (ref 3.87–5.11)
RDW: 19.7 % — ABNORMAL HIGH (ref 11.5–15.5)
WBC: 15 10*3/uL — ABNORMAL HIGH (ref 4.0–10.5)

## 2014-08-13 LAB — GLUCOSE, CAPILLARY
GLUCOSE-CAPILLARY: 180 mg/dL — AB (ref 65–99)
Glucose-Capillary: 122 mg/dL — ABNORMAL HIGH (ref 65–99)
Glucose-Capillary: 131 mg/dL — ABNORMAL HIGH (ref 65–99)
Glucose-Capillary: 140 mg/dL — ABNORMAL HIGH (ref 65–99)
Glucose-Capillary: 148 mg/dL — ABNORMAL HIGH (ref 65–99)
Glucose-Capillary: 77 mg/dL (ref 65–99)

## 2014-08-13 LAB — MAGNESIUM: MAGNESIUM: 1.7 mg/dL (ref 1.7–2.4)

## 2014-08-13 LAB — VITAMIN D 1,25 DIHYDROXY
Vitamin D 1, 25 (OH)2 Total: 15 pg/mL
Vitamin D2 1, 25 (OH)2: <10 pg/mL
Vitamin D3 1, 25 (OH)2: 15 pg/mL

## 2014-08-13 LAB — PHOSPHORUS: Phosphorus: 4.2 mg/dL (ref 2.5–4.6)

## 2014-08-13 MED ORDER — HYDROMORPHONE HCL 1 MG/ML IJ SOLN
INTRAMUSCULAR | Status: AC
Start: 2014-08-13 — End: 2014-08-13
  Administered 2014-08-13: 1 mg via INTRAVENOUS
  Filled 2014-08-13: qty 1

## 2014-08-13 MED ORDER — HYDROMORPHONE HCL 1 MG/ML IJ SOLN
1.0000 mg | INTRAMUSCULAR | Status: DC | PRN
  Administered 2014-08-13 – 2014-08-15 (×9): 1 mg via INTRAVENOUS
  Administered 2014-08-15 – 2014-08-16 (×2): 2 mg via INTRAVENOUS
  Administered 2014-08-16: 1 mg via INTRAVENOUS
  Administered 2014-08-16 – 2014-08-18 (×6): 2 mg via INTRAVENOUS
  Administered 2014-08-18: 1 mg via INTRAVENOUS
  Administered 2014-08-19 – 2014-08-20 (×3): 2 mg via INTRAVENOUS
  Administered 2014-08-20: 1 mg via INTRAVENOUS
  Administered 2014-08-20 – 2014-08-21 (×2): 2 mg via INTRAVENOUS
  Administered 2014-08-21 – 2014-08-22 (×4): 1 mg via INTRAVENOUS
  Administered 2014-08-22 – 2014-08-23 (×3): 2 mg via INTRAVENOUS
  Administered 2014-08-23: 1 mg via INTRAVENOUS
  Administered 2014-08-23: 2 mg via INTRAVENOUS
  Administered 2014-08-24: 1 mg via INTRAVENOUS
  Administered 2014-08-24: 2 mg via INTRAVENOUS
  Administered 2014-08-24: 1 mg via INTRAVENOUS
  Administered 2014-08-25 (×2): 2 mg via INTRAVENOUS
  Administered 2014-08-25 – 2014-08-26 (×2): 1 mg via INTRAVENOUS
  Administered 2014-08-26 – 2014-08-27 (×2): 2 mg via INTRAVENOUS
  Administered 2014-08-27: 1 mg via INTRAVENOUS
  Administered 2014-08-28 (×2): 2 mg via INTRAVENOUS
  Administered 2014-08-29 (×2): 1 mg via INTRAVENOUS
  Administered 2014-08-29 – 2014-08-31 (×5): 2 mg via INTRAVENOUS
  Administered 2014-08-31 – 2014-09-01 (×2): 1 mg via INTRAVENOUS
  Administered 2014-09-01 (×2): 2 mg via INTRAVENOUS
  Administered 2014-09-02 – 2014-09-03 (×2): 1 mg via INTRAVENOUS
  Administered 2014-09-03 – 2014-09-06 (×5): 2 mg via INTRAVENOUS
  Administered 2014-09-07 – 2014-09-11 (×8): 1 mg via INTRAVENOUS
  Filled 2014-08-13: qty 2
  Filled 2014-08-13 (×2): qty 1
  Filled 2014-08-13 (×2): qty 2
  Filled 2014-08-13 (×3): qty 1
  Filled 2014-08-13: qty 2
  Filled 2014-08-13: qty 1
  Filled 2014-08-13 (×2): qty 2
  Filled 2014-08-13: qty 1
  Filled 2014-08-13: qty 2
  Filled 2014-08-13 (×4): qty 1
  Filled 2014-08-13 (×3): qty 2
  Filled 2014-08-13 (×3): qty 1
  Filled 2014-08-13: qty 2
  Filled 2014-08-13: qty 1
  Filled 2014-08-13 (×3): qty 2
  Filled 2014-08-13: qty 1
  Filled 2014-08-13: qty 2
  Filled 2014-08-13 (×2): qty 1
  Filled 2014-08-13 (×3): qty 2
  Filled 2014-08-13: qty 1
  Filled 2014-08-13 (×3): qty 2
  Filled 2014-08-13: qty 1
  Filled 2014-08-13 (×2): qty 2
  Filled 2014-08-13 (×2): qty 1
  Filled 2014-08-13: qty 2
  Filled 2014-08-13: qty 1
  Filled 2014-08-13 (×4): qty 2
  Filled 2014-08-13 (×3): qty 1
  Filled 2014-08-13 (×2): qty 2
  Filled 2014-08-13 (×2): qty 1
  Filled 2014-08-13: qty 2
  Filled 2014-08-13: qty 1
  Filled 2014-08-13 (×3): qty 2
  Filled 2014-08-13: qty 1
  Filled 2014-08-13 (×4): qty 2
  Filled 2014-08-13: qty 1
  Filled 2014-08-13 (×2): qty 2
  Filled 2014-08-13 (×2): qty 1
  Filled 2014-08-13: qty 2

## 2014-08-13 MED ORDER — DARBEPOETIN ALFA 200 MCG/0.4ML IJ SOSY
200.0000 ug | PREFILLED_SYRINGE | INTRAMUSCULAR | Status: DC
  Filled 2014-08-13: qty 0.4

## 2014-08-13 MED ORDER — CLINIMIX E/DEXTROSE (5/15) 5 % IV SOLN
INTRAVENOUS | Status: AC
  Administered 2014-08-13: 18:00:00 via INTRAVENOUS
  Filled 2014-08-13: qty 2400

## 2014-08-13 MED ORDER — SODIUM CHLORIDE 0.9 % IV SOLN
125.0000 mg | INTRAVENOUS | Status: AC
  Administered 2014-08-13 – 2014-08-22 (×5): 125 mg via INTRAVENOUS
  Filled 2014-08-13 (×9): qty 10

## 2014-08-13 MED ORDER — MAGNESIUM SULFATE 2 GM/50ML IV SOLN
2.0000 g | Freq: Once | INTRAVENOUS | Status: AC
  Filled 2014-08-13: qty 50

## 2014-08-13 MED ORDER — VITAL AF 1.2 CAL PO LIQD
1000.0000 mL | ORAL | Status: DC
  Administered 2014-08-13 – 2014-08-19 (×2): 1000 mL
  Filled 2014-08-13 (×6): qty 1000

## 2014-08-13 NOTE — Progress Notes (Signed)
Referring Physician(s): CCM  Subjective:  GJ tube placed 5/11 Pt feeling some better today G port to suction   Allergies: Review of patient's allergies indicates no known allergies.  Medications: Prior to Admission medications   Medication Sig Start Date End Date Taking? Authorizing Provider  allopurinol (ZYLOPRIM) 100 MG tablet Take 100 mg by mouth 2 (two) times daily as needed (for gout).   Yes Historical Provider, MD  citalopram (CELEXA) 20 MG tablet Take 20 mg by mouth daily.   Yes Historical Provider, MD  furosemide (LASIX) 80 MG tablet Take 80 mg by mouth daily.   Yes Historical Provider, MD  hydrOXYzine (ATARAX/VISTARIL) 25 MG tablet Take 25 mg by mouth 3 (three) times daily as needed for anxiety.   Yes Historical Provider, MD  oxyCODONE (ROXICODONE) 5 MG immediate release tablet Take 1 tablet (5 mg total) by mouth every 6 (six) hours as needed for severe pain. Patient not taking: Reported on 06/17/2014 01/28/14   Raymond GurneyKimberly A Trinh, PA-C     Vital Signs: BP 97/52 mmHg  Pulse 117  Temp(Src) 98.7 F (37.1 C) (Oral)  Resp 24  Ht 5\' 6"  (1.676 m)  Wt 86.2 kg (190 lb 0.6 oz)  BMI 30.69 kg/m2  SpO2 94%  Physical Exam  Skin:  Skin site is clean and dry Tender No bleeding Both ports intact afeb    Imaging: Dg Chest 1 View  08/13/2014   CLINICAL DATA:  Shortness of breath  EXAM: CHEST  1 VIEW  COMPARISON:  08/03/2014  FINDINGS: Moderately severe cardiac enlargement. Central line in unchanged position on the right and left side. Mild vascular congestion with no evidence of edema effusion or consolidation.  IMPRESSION: No acute findings   Electronically Signed   By: Esperanza Heiraymond  Rubner M.D.   On: 08/13/2014 11:04   Ir Gastrostomy Tube Mod Sed  08/12/2014   CLINICAL DATA:  Perforated duodenal ulcer, post g patch with persistent leak. Percutaneous gastrojejunostomy as requested for gastric decompression and small bowel enteral feeding support.  EXAM: PERC PLACEMENT  GASTROSTOMY;  CONVERT G-TUBE TO G-JTUBE  FLUOROSCOPY TIME:  16 minutes 24 seconds, 1016.6 mGy  TECHNIQUE: The procedure, risks, benefits, and alternatives were explained to the patient. Questions regarding the procedure were encouraged and answered. The patient understands and consents to the procedure. As antibiotic prophylaxis, cefazolin 2 g was ordered pre-procedure and administered intravenously within one hour of incision. . A 5 French angiographic catheter was placed as orogastric tube. The upper abdomen was prepped with Betadine, draped in usual sterile fashion, and infiltrated locally with 1% lidocaine. 1 mg glucagon was administered intravenously to facilitate gastric distention and slow peristalsis. Stomach was insufflated using air through the orogastric tube. An 5718 French sheath needle was advanced percutaneously into the gastric lumen under fluoroscopy. Gas could be aspirated and a small contrast injection confirmed intraluminal spread. The sheath was exchanged over a guidewire for a 9 [place] vascular sheath, through which the snare device was advanced and used to snare a guidewire passed through the orogastric tube. This was withdrawn, and the snare attached to the 20 French pull-through gastrostomy tube, which was advanced antegrade, positioned with the internal bumper securing the anterior gastric wall to the anterior abdominal wall. Small contrast injection confirms appropriate positioning. The external bumper was applied and the catheter was flushed.  A 5 French angiographic catheter was placed coaxially through the gastrostomy tube and used to direct an angled stiff glidewire across the pylorus, beyond the proximal duodenal  perforation, and into the proximal jejunum just beyond the ligament of Treitz. Over this, a coaxial jejunostomy feeding adapter was advanced. Contrast injection confirmed patency and appropriate position of the gastric and jejunal lumens. No extravasation. The lumens were  flushed with saline and capped. The patient tolerated the procedure well.  COMPLICATIONS: COMPLICATIONS none  IMPRESSION: 1. Technically successful 20 French pull-through gastrostomy placement under fluoroscopy. 2. Technically successful coaxial jejunostomy feeding adapter placement to the ligament of Treitz.   Electronically Signed   By: Corlis Leak  Hassell M.D.   On: 08/12/2014 16:12   Ir Adele SchilderGastr Tamsen Sniderube Convert Gastr-jej Per W/fl Mod Sed  08/12/2014   CLINICAL DATA:  Perforated duodenal ulcer, post g patch with persistent leak. Percutaneous gastrojejunostomy as requested for gastric decompression and small bowel enteral feeding support.  EXAM: PERC PLACEMENT GASTROSTOMY;  CONVERT G-TUBE TO G-JTUBE  FLUOROSCOPY TIME:  16 minutes 24 seconds, 1016.6 mGy  TECHNIQUE: The procedure, risks, benefits, and alternatives were explained to the patient. Questions regarding the procedure were encouraged and answered. The patient understands and consents to the procedure. As antibiotic prophylaxis, cefazolin 2 g was ordered pre-procedure and administered intravenously within one hour of incision. . A 5 French angiographic catheter was placed as orogastric tube. The upper abdomen was prepped with Betadine, draped in usual sterile fashion, and infiltrated locally with 1% lidocaine. 1 mg glucagon was administered intravenously to facilitate gastric distention and slow peristalsis. Stomach was insufflated using air through the orogastric tube. An 3318 French sheath needle was advanced percutaneously into the gastric lumen under fluoroscopy. Gas could be aspirated and a small contrast injection confirmed intraluminal spread. The sheath was exchanged over a guidewire for a 9 [place] vascular sheath, through which the snare device was advanced and used to snare a guidewire passed through the orogastric tube. This was withdrawn, and the snare attached to the 20 French pull-through gastrostomy tube, which was advanced antegrade, positioned with the  internal bumper securing the anterior gastric wall to the anterior abdominal wall. Small contrast injection confirms appropriate positioning. The external bumper was applied and the catheter was flushed.  A 5 French angiographic catheter was placed coaxially through the gastrostomy tube and used to direct an angled stiff glidewire across the pylorus, beyond the proximal duodenal perforation, and into the proximal jejunum just beyond the ligament of Treitz. Over this, a coaxial jejunostomy feeding adapter was advanced. Contrast injection confirmed patency and appropriate position of the gastric and jejunal lumens. No extravasation. The lumens were flushed with saline and capped. The patient tolerated the procedure well.  COMPLICATIONS: COMPLICATIONS none  IMPRESSION: 1. Technically successful 20 French pull-through gastrostomy placement under fluoroscopy. 2. Technically successful coaxial jejunostomy feeding adapter placement to the ligament of Treitz.   Electronically Signed   By: Corlis Leak  Hassell M.D.   On: 08/12/2014 16:12    Labs:  CBC:  Recent Labs  08/10/14 0410 08/11/14 1220 08/12/14 0406 08/13/14 0322  WBC 16.4* 13.6* 13.2* 15.0*  HGB 7.8* 6.8* 9.0* 9.1*  HCT 24.1* 22.3* 29.1* 29.3*  PLT 357 302 279 288    COAGS:  Recent Labs  07/01/14 1813 07/02/14 0430  07/04/14 0510 07/07/14 0400 07/10/14 0715 08/12/14 1101  INR 1.83* 1.51*  --   --   --  1.21 1.19  APTT  --  35  < > 36 41* 37 36  < > = values in this interval not displayed.  BMP:  Recent Labs  08/11/14 0415 08/11/14 1220 08/12/14 0406 08/13/14 0322  NA  131* 127* 132* 129*  K 3.8 3.9 3.9 4.2  CL 95* 92* 97* 94*  CO2 GLUCOSE 166* 733* 145* 129*  BUN 54* 11 30* 62*  CALCIUM 8.0* 7.0* 7.9* 8.3*  CREATININE 4.40* 1.46* 2.67* 4.25*  GFRNONAA 10* 39* 19* 11*  GFRAA 12* 45* 22* 12*    LIVER FUNCTION TESTS:  Recent Labs  08/06/14 0410 08/08/14 0530 08/10/14 0410 08/11/14 0415 08/12/14 0406  08/13/14 0322  BILITOT 1.0 1.1 1.0  --   --  0.8  AST --   --  23  ALT --   --  14  ALKPHOS 160* 161* 170*  --   --  170*  PROT 7.3 6.5 6.6  --   --  6.9  ALBUMIN 1.9* 1.7* 1.7* 1.6* 1.7* 1.7*    Assessment and Plan:  GJ tube intact G port to suction May use J port now  Signed: Christene Pounds A 08/13/2014, 1:19 PM   I spent a total of 15 Minutes in face to face in clinical consultation/evaluation, greater than 50% of which was counseling/coordinating care for GJ tube placement

## 2014-08-13 NOTE — Progress Notes (Signed)
[name] ZOX:[id number] DOB: 1956-03-13 DOA: 06/17/2014 PCP: [name], MD  Admit HPI / Brief Narrative: 59 yo ? 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 Page, failed L AV fistula, initially admitted 3/16 with flu and acute hypoxic resp failure. Ultimately tx to [facility] due to need for HD L sided weakness 3/18 and found to have R ACA stroke-showed acute infarct R ACA  Patient ? lethargic 3/30 and CT abd=perf viscus 3/31 was taken to the OR for ex lap for perforated pyloric ulcer with diffuse peritonitis, ischemic necrosis of mid ileum. Patient developed septic shock on 4-1, she was started on pressors and wean off pressors on 4-3.  Subsequently she was diagnosed with pelvic abscess by CT scan perform on 4-7. She underwent pelvic drain placement by IR.  C. diff diagnosed 4-12-not responding flagyl, and she was started on vancomycin 4-16.  develops  run of SVT. Cardiology has been helping with management.  Course complicated with high RV pressure ,CT angio negative for PE. - Also with high output fistula formation between anastomosis and drain, making her back to NPO state and in need of TPN. FULL CODE Palliative care consulted and following.  On 5-2 develops hypoxemia, hypotension and SVT during dialysis treatment. Dialysis was stop. She received IV bolus. She was started on BIPAP. She was transfer to Step down unit. CCM and cardiology re consulted.Her Hr decreased, BP improved. She was taken off BIPAP. Now stable, on TNA, off all Abx, continues to have very high output from fistula/leak per [facility] Had a 33f Coax DL G_J tube placed,c tip @ lig Treitz by Dr. [name] of IR and Gen surgery has contuinued to assist with management of her multiple surgical comorbidities  Polyconsultant roles greatly appreciated    HPI/Subjective:  Seen on dialysis unit Alert in pain 9/10-located in the abdomen Asking for pain meds No stool apparently since 4/29 No  cp Pressures have been a little soft Feels like she needs a Neb    Assessment/Plan: Acute hypoxic Respiratory Failure:  -2nd to H1N1, hypervolemia, HCAP >> resolved.  -also has COPD and PAH -improving, now weaned off O2 -Nebulizer PRN and CPAP QHS   Septic shock  -due to perforated prepyloric ulcer, required pressors -resolved currently  C. difficile colitis still on contact precautions -C diff positive on 4-12, Received 4 days of flagyl without improvement, then started on PO vanc -Completed 3 weeks of Oral Vanc-stopped 5/7, this was 1 week after IV Zosyn was stopped 5/5 -Diarrhea resolved  Status post exploratory laparotomy with closure of perforated pyloric ulcer/abdominal abscess / small bowel resection w/ postop ileus and now with fistula  -Pelvic abscess s/p percutaneous drain placement 4/8; had leakage from anastomosis site ? received Zosyn for 23 days.  -chronic low grade leukocytosis range 13-15 -IV antibiotics discontinued by Surgery on 4/22, also Received 18 days of Vancomycin and fluconazole.  -Due to Fistula is back to NPO state, TPN.  -Fistula study done shows JP drain connection to the duodenal bulb-5-02. - repeat CT abdomen with  3.0 x 3.5 x 5.7 cm collection in the pelvic cul-de-sac and extraluminal air alongside JP drain in anterior abd wall -Per [facility] when to repeat CT abd/pelvis--She still has a leukocytosis and is off Abx -Drain with old and new blood, Hb trending down, transfuse 2 unit PRBC 5/10 -continues to have high output from Fistula/leak ?-Had 15f Coax DL G_J placed, tip lig Treitz placed  by IR 08/12/14 at request of gen surgery -d/w Dr [name] 5/12 whose expertise is greatly appreciated-Will consider ? D/c Octreotide and in the next7-10 days determine what to do re: J tube feeds with G-tube portion to drain  Hypokalemia;  -replace in TNA Current order as per RD/PharmD onn 5/11  - Change to Clinimix E 5/15 with electrolytes at 100/hr and 20% IVFE at  6810ml/hr. Lytes taken out on Monday 5/9 due to elevated CaxPhos product. Lytes added back Wednesday 5/11 due to low phos.  - 10 mM naphos bolus, 2 gm mag bolus - Trace elements and MVI daily in TPN.  - Continue sensitive SSI q6h -TPN labs in am  PSVT/concerns for pulmonary embolism given extremely high heart right side pressure -Has been evaluated by EP, no further episodes for 48h -Cardiology following peripherally- follow K+ and Mg  -Continue IV metoprolol, now off amiodarone;  -V-Q scan with intermediate probability for pulmonary embolism.  -CTA 4/27, negative for PE    Small PFO Has been evaluated by Cardiology  ACA CVA with Lt sided weakness -TEE ;normal LV function; no LAA thrombus -Started on ASA 81mg  per NEuro, now NPO -Will need ongoing long term rehab - PT/OT to cont to follow while inpatient -Patient remains very weak and deconditioned .   ESRD new start HD this admit coml;icated by intermittent hypotension AKi on CKD 4, due to sepsis, shock Ongoing hemodialysis per Nephrology - perm-cath placed 3/26 d/t AVF problems Patient to have AV fistula reviewed at some point for permanent access (most likely in outpatient setting) .  -she was unable to complete dialysis treatment 5-2 due to hypotension, SVT, stable since.  -has had some ongoing hypotension-EDW may need to be adjusted as per Nephrology  Chronic diastolic congestive heart failure w/ Severe RHF EF 60-65%, grade 1 diastolic dysfunction. Fluids/volume managed with hemodialysis.  Elevated PA pressures on 2-D echo; CTA negative for PE  Anemia of critical illness/chronic disease -and bleeding from leak/fistula -s/p 2U PRBC 4/13  -Hb was trending down 6.8 today Transfuse 2 u PRBC 5/10 Aranesp/iron per renal  Thrombocytopenia; resolved.  Appears to be associated with sepsis Has had mild positive HIT test during this admission  SCDs for DVT proph  Asthma/COPD Continue oxygen supplementation as needed Will  continue pulmicort Continue xopenex CXR 1 view 5/12 ,Pending  Diabetes mellitus 2 Hypoglycemia in setting of NPO status. On TPN now, CBGs  stabilizedin 120-160s Hbaic 6.5  Abnormal TSH -Free T4 WNL, T3 1.8 -most likely sick thyroid with ongoing infection and body stress; also due to amiodarone use. TSH in 7 range -continue low dose synthroid given PSVT, now on IV synthroid   L Renal mass  -noted on US, Indeterminate 1.9 cm hypoechoic mass off the interpolar region of the left kidney -may potentially represent a cyst however solid mass is not excluded -needs  Outpatient FU of this  Obesity - Inadequate oral intake related to altered GI function as evidenced by limited intake, ongoing.  Body mass index is 30.97 kg/(m^2). -patient weight trending down -patient unable to tolerate diet and with leaking fistula  Code Status: FULL Family Communication: discussed with patient alone Disposition Plan: Unclear-needs a lot of stasbilizing--could potenttially consider LTACH in next2 weeks   Consultants: Cardiology Nephrology PCCM Gen Surgery  Palliative care  Significant Events: 3/16 influenza A+ 3/18 R ACA stroke 3/19 MRI multifocal acute infarction of both hemispheres, large right distal ACA 3/22 TEE severe RAE, severely reduced RV function, severe TR, no LAA  thrombus, small PFO on TEE 3/31 to OR ex lap for per pyloric ulcer and sm bowel resection 4/1 septic shock on pressors 4/3 pressors weaned off  4/4 restart pressors, CRRT 4/6 levo switched to neo 4/7 pelvic abscess on CT 4/8 IR placed pelvic drain 4/9 off pressors 4/11 long run of SVT. 4/23 VQ scan which demonstrated intermediate probability for PE 4/24 CT angiogram of her chest: Pending/unable to be done due to lack of access   Antibiotics: Zosyn >stopped on 4/22 (received a total of 23 days of treatment ) Vancomycin 4-16  DVT prophylaxis: SCDs  Objective: Blood pressure 98/62, pulse 112, temperature 98 F (36.7  C), temperature source Oral, resp. rate 24, height 5\' 6"  (1.676 m), weight 87 kg (191 lb 12.8 oz), SpO2 96 %.  Intake/Output Summary (Last 24 hours) at 08/13/14 0902 Last data filed at 08/13/14 0700  Gross per 24 hour  Intake 4047.08 ml  Output    500 ml  Net 3547.08 ml   Exam: General: AAOx3, tired coherent Lungs: ? AE, HD catheter Cardiovascular: Regular rate, no murmurs and no gallops, sinus tachycardia  Abdomen: Abdominal wound dressing noted-patient in too muc pain for me to remove bandages  Data Reviewed: Basic Metabolic Panel:  Recent Labs Lab 08/08/14 0530  08/10/14 0410 08/11/14 0400 08/11/14 0415 08/11/14 1220 08/12/14 0406 08/13/14 0322  NA 130*  < > 127*  --  131* 127* 132* 129*  K 3.1*  < > 4.1  --  3.8 3.9 3.9 4.2  CL 86*  < > 79*  --  95* 92* 97* 94*  CO2 31  < > 26  --  28 25 27 22   GLUCOSE 136*  < > 97  --  166* 733* 145* 129*  BUN 83*  < > 143*  --  54* 11 30* 62*  CREATININE 6.06*  < > 8.90*  --  4.40* 1.46* 2.67* 4.25*  CALCIUM 8.2*  < > 8.2*  --  8.0* 7.0* 7.9* 8.3*  MG  --   --  2.0 2.0  --   --  1.7 1.7  PHOS 3.6  --  8.3*  --  4.6  --  2.2* 4.2  < > = values in this interval not displayed.  Liver Function Tests:  Recent Labs Lab 08/08/14 0530 08/10/14 0410 08/11/14 0415 08/12/14 0406 08/13/14 0322  AST 27 25  --   --  23  ALT 16 15  --   --  14  ALKPHOS 161* 170*  --   --  170*  BILITOT 1.1 1.0  --   --  0.8  PROT 6.5 6.6  --   --  6.9  ALBUMIN 1.7* 1.7* 1.6* 1.7* 1.7*   CBC:  Recent Labs Lab 08/09/14 0905 08/10/14 0410 08/11/14 1220 08/12/14 0406 08/13/14 0322  WBC 13.4* 16.4* 13.6* 13.2* 15.0*  NEUTROABS  --  11.9*  --   --   --   HGB 7.2* 7.8* 6.8* 9.0* 9.1*  HCT 22.0* 24.1* 22.3* 29.1* 29.3*  MCV 86.6 88.0 93.3 88.4 88.3  PLT 318 357 302 279 288    CBG:  Recent Labs Lab 08/12/14 1145 08/12/14 1701 08/13/14 0032 08/13/14 0254 08/13/14 0552  GLUCAP 160* 177* 77 122* 131*    Scheduled Meds:  Scheduled  Meds: . antiseptic oral rinse  7 mL Mouth Rinse q12n4p  . budesonide (PULMICORT) nebulizer solution  0.25 mg Nebulization BID  . chlorhexidine  15 mL Mouth Rinse BID  . [  START ON 08/18/2014] darbepoetin (ARANESP) injection - DIALYSIS  200 mcg Intravenous Q Tue-HD  . ferric gluconate (FERRLECIT/NULECIT) IV  125 mg Intravenous Q T,Th,Sa-HD  . insulin aspart  0-9 Units Subcutaneous 4 times per day  . levothyroxine  12.5 mcg Intravenous Daily  . magnesium sulfate 1 - 4 g bolus IVPB  2 g Intravenous Once  . magnesium sulfate 1 - 4 g bolus IVPB  2 g Intravenous Once  . metoprolol  2.5 mg Intravenous 3 times per day  . sodium chloride  10-40 mL Intracatheter Q12H  . sodium phosphate  Dextrose 5% IVPB  10 mmol Intravenous Once    Time spent on care of this patient: 35 minutes   Pleas Koch, MD [facility] ([phone]   If 7PM-7AM, please contact night-coverage [email or web address] Password [email or web address] 08/13/2014, 9:02 AM   LOS: 57 days

## 2014-08-13 NOTE — Progress Notes (Signed)
Pt refused CPAP for tonight. Pt states the mask is uncomfortable and she does not wear CPAP at home. Pt remains on 3lpm nasal cannula SpO2 93%. RN notified. RT will continue to monitor as needed.

## 2014-08-13 NOTE — Procedures (Signed)
Pt seen on HD.  Ap 160  Vp 160  BFR 300.  SBP 98.  K 4.2.  PO4 4.2

## 2014-08-13 NOTE — Progress Notes (Signed)
Patient: [name] / Admit Date: 06/17/2014 / Date of Encounter: 08/13/2014, 10:34 AM   Subjective: No CP but does report continued abdominal pain. Mild dyspnea.   Objective: Telemetry: NSR/sinus tach, brief run of SVT this AM around 845 Physical Exam: Blood pressure 102/76, pulse 114, temperature 98 F (36.7 C), temperature source Oral, resp. rate 24, height 5\' 6"  (1.676 m), weight 191 lb 12.8 oz (87 kg), SpO2 96 %. General: Chronically ill AAF in no acute distress. Head: Normocephalic, atraumatic, sclera non-icteric, no xanthomas, nares are without discharge. Neck: JVP not elevated. Lungs: Decreased breath sounds throughout. No wheezes, rales, or rhonchi. Breathing is unlabored. Heart: Reg rhythm, increased rate, S1 S2 without murmurs, rubs, or gallops.  Abdomen: Soft but tender. +BS. Extremities: No clubbing or cyanosis. No edema. Distal pedal pulses are 2+ and equal bilaterally. Neuro: Alert and oriented X 3. Moves all extremities spontaneously. Psych:  Responds to questions appropriately with a normal affect.   Intake/Output Summary (Last 24 hours) at 08/13/14 1034 Last data filed at 08/13/14 0700  Gross per 24 hour  Intake 4047.08 ml  Output    500 ml  Net 3547.08 ml    Inpatient Medications:  . antiseptic oral rinse  7 mL Mouth Rinse q12n4p  . budesonide (PULMICORT) nebulizer solution  0.25 mg Nebulization BID  . chlorhexidine  15 mL Mouth Rinse BID  . [START ON 08/18/2014] darbepoetin (ARANESP) injection - DIALYSIS  200 mcg Intravenous Q Tue-HD  . ferric gluconate (FERRLECIT/NULECIT) IV  125 mg Intravenous Q T,Th,Sa-HD  . insulin aspart  0-9 Units Subcutaneous 4 times per day  . levothyroxine  12.5 mcg Intravenous Daily  . magnesium sulfate 1 - 4 g bolus IVPB  2 g Intravenous Once  . magnesium sulfate 1 - 4 g bolus IVPB  2 g Intravenous Once  . metoprolol  2.5 mg Intravenous 3 times per day  . sodium chloride  10-40 mL Intracatheter Q12H  . sodium phosphate   Dextrose 5% IVPB  10 mmol Intravenous Once   Infusions:  . sodium chloride 10 mL/hr at 08/10/14 2300  . Marland Kitchen.TPN (CLINIMIX-E) Adult 100 mL/hr at 08/13/14 0700   And  . fat emulsion 250 mL (08/13/14 0700)  . octreotide  (SANDOSTATIN)    IV infusion 50 mcg/hr (08/13/14 0700)    Labs:  Recent Labs  08/12/14 0406 08/13/14 0322  NA 132* 129*  K 3.9 4.2  CL 97* 94*  CO2 27 22  GLUCOSE 145* 129*  BUN 30* 62*  CREATININE 2.67* 4.25*  CALCIUM 7.9* 8.3*  MG 1.7 1.7  PHOS 2.2* 4.2    Recent Labs  08/12/14 0406 08/13/14 0322  AST  --  23  ALT  --  14  ALKPHOS  --  170*  BILITOT  --  0.8  PROT  --  6.9  ALBUMIN 1.7* 1.7*    Recent Labs  08/12/14 0406 08/13/14 0322  WBC 13.2* 15.0*  HGB 9.0* 9.1*  HCT 29.1* 29.3*  MCV 88.4 88.3  PLT 279 288   No results for input(s): CKTOTAL, CKMB, TROPONINI in the last 72 hours. Invalid input(s): POCBNP No results for input(s): HGBA1C in the last 72 hours.   Radiology/Studies:  Ct Abdomen Pelvis Wo Contrast  07/16/2014   CLINICAL DATA:  Followup abdominal abscess  EXAM: CT ABDOMEN AND PELVIS WITHOUT CONTRAST  TECHNIQUE: Multidetector CT imaging of the abdomen and pelvis was performed following the standard protocol without IV contrast. A small amount of oral contrast  was administered.  COMPARISON:  07/10/2014/07/09/2014  FINDINGS: The lung bases demonstrate minimal left basilar atelectasis. The heart remains enlarged in size.  The liver, gallbladder, spleen, adrenal glands and pancreas are all normal in their CT appearance. A surgical drain is noted in the left upper quadrant stable from the prior exam. A minimal amount of left upper quadrant fluid remains adjacent to the spleen. This is roughly stable from the prior exam. Kidneys are stable in appearance. Mild dilatation of the abdominal aorta is noted and stable.  There is been interval placement of a trans gluteal drainage catheter. The amount of pelvic fluid has decreased significantly  although a small rim out remains. It now measures approximately 6.4 x 2.4 cm in greatest dimension. This is decreased from 9.0 x 6.2 cm. Calcified uterine fibroids are again seen. No new focal abnormality is noted. The bladder is well distended. No bony abnormality is seen.  IMPRESSION: Interval reduction in the size of pelvic fluid collection. Correlation with current drainage levels is recommended.  The remainder of the exam is stable from the prior studies.   Electronically Signed   By: Alcide CleverMark  Lukens M.D.   On: 07/16/2014 11:23   Dg Abd 1 View  08/03/2014   CLINICAL DATA:  NG tube placement  EXAM: ABDOMEN - 1 VIEW  COMPARISON:  07/13/2014  FINDINGS: NG tube tip is in the proximal stomach. The side port is near the GE junction.  IMPRESSION: NG tube tip in the proximal stomach.   Electronically Signed   By: Charlett NoseKevin  Dover M.D.   On: 08/03/2014 17:18   Ct Angio Chest Pe W/cm &/or Wo Cm  07/29/2014   CLINICAL DATA:  Shortness of breath. Patients brief history is as follows: "smoker with hx HTN, ESRD on HD (just began this admit), COPD, failed L AV fistula, initially admitted 3/16 with flu and acute hypoxic resp failure. Ultimately tx to [facility] due to need for HD. 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 Patient develops septic shock on 4-1, she was started on pressors and wean off pressors on 4-3. Subsequently she was diagnosed with pelvic abscess by CT scan perform on 4-7. She underwent pelvic drain placement by IR. Patient also develops C. diff diagnosed 4-12. C diff was not responding flagyl, and she was started on vancomycin 4-16. She also develops run of SVT. Cardiology has been helping with management. Course complicated with high RV pressure and concerns for PE; also with fistula formation between anastomosis and blake drain, making her back to NPO state and in need of TPN.  EXAM: CT  ANGIOGRAPHY CHEST WITH CONTRAST  TECHNIQUE: Multidetector CT imaging of the chest was performed using the standard protocol during bolus administration of intravenous contrast. Multiplanar CT image reconstructions and MIPs were obtained to evaluate the vascular anatomy.  CONTRAST:  80mL OMNIPAQUE IOHEXOL 350 MG/ML SOLN  COMPARISON:  Chest radiograph, 07/14/2014  FINDINGS: Angiographic study: No evidence of a pulmonary embolus. Aorta is normal in caliber. No dissection.  Thoracic inlet:  No masses or adenopathy.  Mediastinum and hila: Heart is mildly enlarged due to right heart enlargement. No mediastinal or hilar masses or pathologically enlarged lymph nodes.  Lungs and pleura: No lung consolidation or edema. Mild emphysema. There are minor areas of peripheral reticular scarring and minor dependent subsegmental atelectasis. No pleural effusion or pneumothorax.  Limited  upper abdomen: Partly image catheter lies above the proximal stomach. No acute findings in the upper abdomen.  Musculoskeletal:  No osteoblastic or osteolytic lesions.  Review of the MIP images confirms the above findings.  IMPRESSION: 1. No evidence of a pulmonary embolus. 2. No acute findings. 3. Cardiomegaly. 4. Mild emphysema.  No evidence of pneumonia or pulmonary edema.   Electronically Signed   By: Amie Portland M.D.   On: 07/29/2014 15:53   Ct Abdomen Pelvis W Contrast  08/04/2014   CLINICAL DATA:  New epigastric abdominal pain.  EXAM: CT ABDOMEN AND PELVIS WITH CONTRAST  TECHNIQUE: Multidetector CT imaging of the abdomen and pelvis was performed using the standard protocol following bolus administration of intravenous contrast.  CONTRAST:  OMNIPAQUE IOHEXOL 300 MG/ML  SOLN  COMPARISON:  07/16/2014  FINDINGS: There is a small volume extraluminal air alongside the JP drain in the anterior abdominal midline. Source of the air is not clear. No other extraluminal air is evident in the abdomen or pelvis. No undrained fluid collection is  evident alongside the JP drain. There is a 3.5 x 5.7 x 3.0 cm contained collection in the pelvic cul-de-sac which could represent a small abscess.  There is an open midline abdominal wound without associated drainable collection. There is herniation of abdominal fat through the abdominal wall musculature at the location of the wound.  There are normal appearances of the liver, spleen, pancreas, adrenals and kidneys with the exception of a 1.8 cm simple left renal cyst. There is an unchanged 3 cm infrarenal abdominal aortic aneurysm. The duodenum is draped over the aneurysm but appears intact.  There are multiple uterine fibroids.  There is no significant abnormality in the lower chest.  IMPRESSION: *Extraluminal air alongside the JP drain in the anterior abdominal midline, etiology not apparent on this scan *3.0 x 3.5 x 5.7 cm collection in the pelvic cul-de-sac *Unchanged 3 cm infrarenal abdominal aortic aneurysm directly adjacent to the duodenum *Open wound in the abdominal midline. No fluid collection near the wound.   Electronically Signed   By: Ellery Plunk M.D.   On: 08/04/2014 02:24   Ct Abdomen Pelvis W Contrast  07/24/2014   CLINICAL DATA:  Abdominal abscess.  EXAM: CT ABDOMEN AND PELVIS WITH CONTRAST  TECHNIQUE: Multidetector CT imaging of the abdomen and pelvis was performed using the standard protocol following bolus administration of intravenous contrast.  CONTRAST:  OMNIPAQUE IOHEXOL 300 MG/ML  SOLN  COMPARISON:  CT scan of July 16, 2014.  FINDINGS: Severe degenerative disc disease is noted at L5-S1. No significant abnormality is noted in the visualized lung bases.  No gallstones are noted. No focal abnormality is noted in the liver, spleen or pancreas. Stable surgical drain is seen entering epigastric region with tip in left upper quadrant. No fluid is noted around this drain. Adrenal glands appear normal. Stable left renal cyst is noted. Mild right perinephric fluid is noted which is  unchanged. No hydronephrosis or renal obstruction is noted. No renal or ureteral calculi are noted. Atherosclerotic calcifications of abdominal aorta and iliac arteries are noted. 3.1 cm infrarenal abdominal aortic aneurysm is noted. There is no evidence of bowel obstruction. Urinary bladder appears normal. Calcified degenerating fibroids are noted in the uterus. Ovaries appear normal.  Percutaneous trans gluteal drainage catheter noted on prior exam has been removed in the interval. Three rectal fluid collection identified on prior exam is slightly enlarged currently, measuring 6.6 x 3.4 cm. No significant adenopathy is noted.  IMPRESSION: 3.1 cm infrarenal abdominal aortic aneurysm is noted. Recommend followup by ultrasound in 3 years. This recommendation follows ACR consensus guidelines: White Paper of the ACR Incidental Findings Committee II on Vascular Findings. J Am Coll Radiol 2013; 10:789-794.  Surgical drain seen entering epigastric region with distal tip in left upper quadrant is unchanged in position. No fluid collection is noted around it.  Percutaneous transgluteal drainage catheter noted on prior exam has been removed. Pre-rectal fluid collection in pelvis seen on prior exam has slightly increased in size, currently measuring 6.6 x 3.4 cm.   Electronically Signed   By: Lupita Raider, M.D.   On: 07/24/2014 10:41   Ir Gastrostomy Tube Mod Sed  08/12/2014   CLINICAL DATA:  Perforated duodenal ulcer, post g patch with persistent leak. Percutaneous gastrojejunostomy as requested for gastric decompression and small bowel enteral feeding support.  EXAM: PERC PLACEMENT GASTROSTOMY;  CONVERT G-TUBE TO G-JTUBE  FLUOROSCOPY TIME:  16 minutes 24 seconds, 1016.6 mGy  TECHNIQUE: The procedure, risks, benefits, and alternatives were explained to the patient. Questions regarding the procedure were encouraged and answered. The patient understands and consents to the procedure. As antibiotic prophylaxis, cefazolin  2 g was ordered pre-procedure and administered intravenously within one hour of incision. . A 5 French angiographic catheter was placed as orogastric tube. The upper abdomen was prepped with Betadine, draped in usual sterile fashion, and infiltrated locally with 1% lidocaine. 1 mg glucagon was administered intravenously to facilitate gastric distention and slow peristalsis. Stomach was insufflated using air through the orogastric tube. An 63 French sheath needle was advanced percutaneously into the gastric lumen under fluoroscopy. Gas could be aspirated and a small contrast injection confirmed intraluminal spread. The sheath was exchanged over a guidewire for a 9 [place] vascular sheath, through which the snare device was advanced and used to snare a guidewire passed through the orogastric tube. This was withdrawn, and the snare attached to the 20 French pull-through gastrostomy tube, which was advanced antegrade, positioned with the internal bumper securing the anterior gastric wall to the anterior abdominal wall. Small contrast injection confirms appropriate positioning. The external bumper was applied and the catheter was flushed.  A 5 French angiographic catheter was placed coaxially through the gastrostomy tube and used to direct an angled stiff glidewire across the pylorus, beyond the proximal duodenal perforation, and into the proximal jejunum just beyond the ligament of Treitz. Over this, a coaxial jejunostomy feeding adapter was advanced. Contrast injection confirmed patency and appropriate position of the gastric and jejunal lumens. No extravasation. The lumens were flushed with saline and capped. The patient tolerated the procedure well.  COMPLICATIONS: COMPLICATIONS none  IMPRESSION: 1. Technically successful 20 French pull-through gastrostomy placement under fluoroscopy. 2. Technically successful coaxial jejunostomy feeding adapter placement to the ligament of Treitz.   Electronically Signed   By: Corlis Leak M.D.   On: 08/12/2014 16:12   Nm Pulmonary Perf And Vent  07/25/2014   CLINICAL DATA:  Short of breath.  Dialysis patient.  EXAM: NUCLEAR MEDICINE VENTILATION - PERFUSION LUNG SCAN  TECHNIQUE: Ventilation images were obtained in multiple projections using inhaled aerosol technetium 99 M DTPA. Perfusion images were obtained in multiple projections after intravenous injection of Tc-108m MAA.  RADIOPHARMACEUTICALS:  40 mCi Tc-65m DTPA aerosol and 6 mCi Tc-16m MAA  COMPARISON:  CXR 07/14/2014  FINDINGS: Ventilation: 2 segment perfusion defect left lower lobe is matched with the ventilation study. Patchy ventilation on the right with a subsegmental defect in  the right lung base  Perfusion: Normal perfusion of the right lung. Two segment matched perfusion defect left lower lobe.  IMPRESSION: Intermediate probability for pulmonary embolism.   Electronically Signed   By: Marlan Palau M.D.   On: 07/25/2014 14:20   Ir Fluoro Guide Cv Line Left  07/29/2014   CLINICAL DATA:  End-stage renal disease, access for TPN  EXAM: LEFT IJ TUNNELED DOUBLE-LUMEN POWER PICC LINE PLACEMENT WITH ULTRASOUND AND FLUOROSCOPIC GUIDANCE  FLUOROSCOPY TIME:  3 minutes 24 seconds  PROCEDURE: The patient was advised of the possible risks andcomplications and agreed to undergo the procedure. The patient was then brought to the angiographic suite for the procedure.  The LEFT NECKwas prepped with chlorhexidine, drapedin the usual sterile fashion using maximum barrier technique (cap and mask, sterile gown, sterile gloves, large sterile sheet, hand hygiene and cutaneous antisepsis) and infiltrated locally with 1% Lidocaine.  Ultrasound demonstrated patency of the left internal jugular vein, and this was documented with an image. Under real-time ultrasound guidance, this vein was accessed with a 21 gauge micropuncture needle and image documentation was performed. A 0.018 wire was introduced in to the vein. Under sterile conditions and local  anesthesia, a subcutaneous tunnel was created in the left infraclavicular chest. PICC line was tunneled subcutaneously to the venotomy site. Through a peel-away sheath, a cuffed 5 [place] double lumen power PICC was advanced to the lower SVC/right atrial junction. Fluoroscopy during the procedure and fluoro spot radiograph confirms appropriate catheter position. The catheter was flushed and covered with asterile dressing.  Catheter length: 26  Complications: None immediate  IMPRESSION: Successful left internal jugulartunneled double-lumen Power PICC line placement with ultrasound and fluoroscopic guidance. The catheter is ready for use.   Electronically Signed   By: Judie Petit.  Shick M.D.   On: 07/29/2014 13:03   Ir Adele Schilder Tamsen Snider Convert Gastr-jej Per W/fl Mod Sed  08/12/2014   CLINICAL DATA:  Perforated duodenal ulcer, post g patch with persistent leak. Percutaneous gastrojejunostomy as requested for gastric decompression and small bowel enteral feeding support.  EXAM: PERC PLACEMENT GASTROSTOMY;  CONVERT G-TUBE TO G-JTUBE  FLUOROSCOPY TIME:  16 minutes 24 seconds, 1016.6 mGy  TECHNIQUE: The procedure, risks, benefits, and alternatives were explained to the patient. Questions regarding the procedure were encouraged and answered. The patient understands and consents to the procedure. As antibiotic prophylaxis, cefazolin 2 g was ordered pre-procedure and administered intravenously within one hour of incision. . A 5 French angiographic catheter was placed as orogastric tube. The upper abdomen was prepped with Betadine, draped in usual sterile fashion, and infiltrated locally with 1% lidocaine. 1 mg glucagon was administered intravenously to facilitate gastric distention and slow peristalsis. Stomach was insufflated using air through the orogastric tube. An 33 French sheath needle was advanced percutaneously into the gastric lumen under fluoroscopy. Gas could be aspirated and a small contrast injection confirmed intraluminal  spread. The sheath was exchanged over a guidewire for a 9 [place] vascular sheath, through which the snare device was advanced and used to snare a guidewire passed through the orogastric tube. This was withdrawn, and the snare attached to the 20 French pull-through gastrostomy tube, which was advanced antegrade, positioned with the internal bumper securing the anterior gastric wall to the anterior abdominal wall. Small contrast injection confirms appropriate positioning. The external bumper was applied and the catheter was flushed.  A 5 French angiographic catheter was placed coaxially through the gastrostomy tube and used to direct an angled stiff glidewire across the pylorus, beyond  the proximal duodenal perforation, and into the proximal jejunum just beyond the ligament of Treitz. Over this, a coaxial jejunostomy feeding adapter was advanced. Contrast injection confirmed patency and appropriate position of the gastric and jejunal lumens. No extravasation. The lumens were flushed with saline and capped. The patient tolerated the procedure well.  COMPLICATIONS: COMPLICATIONS none  IMPRESSION: 1. Technically successful 20 French pull-through gastrostomy placement under fluoroscopy. 2. Technically successful coaxial jejunostomy feeding adapter placement to the ligament of Treitz.   Electronically Signed   By: Corlis Leak M.D.   On: 08/12/2014 16:12   Ir US Guide Vasc Access Left  07/29/2014   CLINICAL DATA:  End-stage renal disease, access for TPN  EXAM: LEFT IJ TUNNELED DOUBLE-LUMEN POWER PICC LINE PLACEMENT WITH ULTRASOUND AND FLUOROSCOPIC GUIDANCE  FLUOROSCOPY TIME:  3 minutes 24 seconds  PROCEDURE: The patient was advised of the possible risks andcomplications and agreed to undergo the procedure. The patient was then brought to the angiographic suite for the procedure.  The LEFT NECKwas prepped with chlorhexidine, drapedin the usual sterile fashion using maximum barrier technique (cap and mask, sterile gown,  sterile gloves, large sterile sheet, hand hygiene and cutaneous antisepsis) and infiltrated locally with 1% Lidocaine.  Ultrasound demonstrated patency of the left internal jugular vein, and this was documented with an image. Under real-time ultrasound guidance, this vein was accessed with a 21 gauge micropuncture needle and image documentation was performed. A 0.018 wire was introduced in to the vein. Under sterile conditions and local anesthesia, a subcutaneous tunnel was created in the left infraclavicular chest. PICC line was tunneled subcutaneously to the venotomy site. Through a peel-away sheath, a cuffed 5 [place] double lumen power PICC was advanced to the lower SVC/right atrial junction. Fluoroscopy during the procedure and fluoro spot radiograph confirms appropriate catheter position. The catheter was flushed and covered with asterile dressing.  Catheter length: 26  Complications: None immediate  IMPRESSION: Successful left internal jugulartunneled double-lumen Power PICC line placement with ultrasound and fluoroscopic guidance. The catheter is ready for use.   Electronically Signed   By: Judie Petit.  Shick M.D.   On: 07/29/2014 13:03   Dg Sinus/fist Tube Chk-non Gi  07/27/2014   CLINICAL DATA:  Perforated duodenal ulcer with surgical repair. Surgical drain in the upper abdomen. Continued prominent drainage.  EXAM: ABSCESS INJECTION  TECHNIQUE: I injected 30 cc of Omnipaque 300 into the drainage catheter under direct fluoroscopic visualization. Ms. Rincon tolerated the procedure well.  CONTRAST:  30mL OMNIPAQUE IOHEXOL 300 MG/ML  SOLN  FLUOROSCOPY TIME:  Fluoroscopy Time (in minutes and seconds): 0 min 36 seconds  COMPARISON:  CT scan dated 07/24/2014  FINDINGS: There is a 2 cm cavity which appears to lie at the superior aspect of the apex of the duodenal bulb, directly communicating with the drainage catheter. This cavity does communicate with the duodenal bulb with contrast entering the stomach and duodenum.  There is no free intraperitoneal extravasation of contrast.  IMPRESSION: Contained cavity adjacent to the apex of the duodenal bulb which communicates with the drainage catheter. No free intraperitoneal leakage.  Critical Value/emergent results were called by telephone at the time of interpretation on 07/27/2014 at 9:17 Am to [facility] , who verbally acknowledged these results.   Electronically Signed   By: Francene Boyers M.D.   On: 07/27/2014 10:13   Dg Chest Port 1 View  07/15/2014   CLINICAL DATA:  Encounter for central line placement  EXAM: PORTABLE CHEST - 1 VIEW  COMPARISON:  07/14/2014  FINDINGS: Advanced left IJ catheter, tip now at the SVC level. No interval displacement of right IJ dual-lumen catheter.  Stable cardiomegaly and aortic tortuosity.  There is no edema, consolidation, effusion, or pneumothorax.  IMPRESSION: Advanced or replaced left IJ catheter, tip now at the SVC level.   Electronically Signed   By: Marnee Spring M.D.   On: 07/15/2014 01:51   Dg Chest Port 1v Same Day  08/03/2014   CLINICAL DATA:  59 year old female dialysis patient with severe shortness of breath. Respiratory distress. Initial encounter.  EXAM: PORTABLE CHEST - 1 VIEW SAME DAY  COMPARISON:  Chest CTA 07/29/2014 and earlier.  FINDINGS: Portable AP semi upright view at 1400 hrs. Bilateral IJ approach chest catheters appears stable. Stable lung volumes. Stable cardiomegaly and mediastinal contours. No pneumothorax, pulmonary edema, pleural effusion or consolidation. No acute pulmonary opacity identified.  IMPRESSION: No acute cardiopulmonary abnormality.   Electronically Signed   By: Odessa Fleming M.D.   On: 08/03/2014 14:18     Assessment and Plan  73F with HTN, COPD, CKD stage IV with h/o failed L AV fistual, admitted 3/16 with flu and acute hypoxic respiratory failure. Per notes, course complicated by need for HD, acute R ACA stroke, perforated viscus/perforated pyloric ulcer with diffuse peritonitis, ischemic  necrosis of mid ileum, septic shock, pelvic abscess s/p drain, C diff colitis, seen by multiple consultants including palliative care. Cardiology has seen for stroke, elevated troponin, RHF and paroxysmal SVT. Also has severe pulmonary hypertension. CT negative for PE on 4/27.  1. Paroxysmal SVT - likely PAT vs PJRT - no new recs. Continue IV metoprolol as BP tolerates. As per prior notes, can consider amio if she has recalcitrant PSVT. Current sinus tach likely compensatory secondary to GI/medical issues.  2. Right heart failure and severe pulmonary HTN c/w cor pulmonale in the setting of COPD - volume now managed by HD.  3. Stroke noted on 06/19/14 - Small PFO noted by echo, no DVT/SVT by dopplers 06/18/14. TEE 06/23/14: no LAA thrombus, severe RAE; mild RVE; severely reduced RV function; severe TR; bowing of atrial septum right to left; positive saline microcavitation study. EP did not think loop was indicated at the time as they felt high pretest probability of AF - 30 day event monitor recommended but she's been here for the duration of that without any evidence of AF. We can revisit issue of ILR later on depending on how recovery goes. Please make sure cardiology is consulted when she goes home.  4. Elevated troponin earlier this admission - peak 1.13 on admission. Depending on recovery may need outpatient Lexiscan. CT neg for PE in April.  5. 3cm infrarenal aortic aneurysm by CT 08/03/14 - will follow clinically.  6. Abdominal pain/GI issues - per IM.  SignedRonie Spies PA-C Pager: [phone]   Attending Note:   The patient was seen and examined.  Agree with assessment and plan as noted above.  Changes made to the above note as needed.  Pt is doing well No recent episodes of SVT  No CP   Will sign off. Call for questions   Alvia Grove., MD, [facility] 08/13/2014, 1:52 PM [address],  [address] Office [phone] Pager [phone]

## 2014-08-13 NOTE — Progress Notes (Addendum)
Nutrition Follow-up  DOCUMENTATION CODES:  Obesity unspecified  INTERVENTION:   TPN; continue dosing per Pharmacy.  Recommend increase Vital AF 1.2 by 10 ml every 12 hours to goal rate of 75 ml/h to provide 2160 kcals, 135 gm protein, 1460 ml free water daily.  NUTRITION DIAGNOSIS:  Inadequate oral intake related to inability to eat as evidenced by NPO status.  Ongoing.  GOAL:  Patient will meet greater than or equal to 90% of their needs  Met.  MONITOR:  Diet advancement, Labs, Weight trends, Skin (TPN tolerance and adequacy)  ASSESSMENT:  Pt with CKD and hypertension, presented on 3/16 with shortness of breath and generalized weakness x 2 weeks. Pt found to have metabolic acidosis from worsening uremia, H1N1 flu. Pt started on HD. 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  Pt s/p exp lap with closure of perforated pyloric ulcer, and small bowel resection. Received TPN until 4/15, then transitioned to a PO diet + supplements. 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   Patient is receiving TPN with Clinimix E 5/15 @ 100 ml/hr and lipids @ 10 ml/hr. Provides 2184 kcal, and 120 grams protein per day. Meets 100% minimum estimated energy needs and 100% minimum estimated protein needs.   Labs reviewed. Sodium low today. Phos now WNL. BUN/creat elevated.  G-J tube placed 5/11; feeding past the Ligament of Treitz; currently receiving Vital AF 1.2 at 10 ml/h (288 kcals, 18 gm protein, 194 ml free water daily) via J-port.  Received MD Consult for TF initiation and management.  Height:  Ht Readings from Last 1 Encounters:  08/02/14 $RemoveB'5\' 6"'PepsMBpe$  (1.676 m)    Weight:  Wt Readings from Last 1 Encounters:  08/13/14 190 lb 0.6 oz (86.2 kg)   08/11/14 195 lb 1.7 oz (88.5 kg)       Ideal Body Weight:  59.1 kg   BMI:  Body mass index is 30.69 kg/(m^2).  Estimated Nutritional Needs:  Kcal:   2100-2300  Protein:  120-140 gm  Fluid:  1.2 L  Skin:  Wound (see comment) (stage 2 pressure ulcer to sacrum)  Diet Order:  TPN (CLINIMIX-E) Adult Diet NPO time specified Except for: Ice Chips  EDUCATION NEEDS:  Education needs no appropriate at this time   Intake/Output Summary (Last 24 hours) at 08/13/14 1330 Last data filed at 08/13/14 1150  Gross per 24 hour  Intake 4047.08 ml  Output   2150 ml  Net 1897.08 ml    Last BM:  4/29   Molli Barrows, RD, LDN, [place] Pager [phone] After Hours Pager [phone]

## 2014-08-13 NOTE — Progress Notes (Signed)
PARENTERAL NUTRITION CONSULT NOTE - FOLLOW UP  Pharmacy Consult for TPN Indication:  High output Duodenal Fistula  No Known Allergies  Patient Measurements: Height: 5\' 6"  (167.6 cm) Weight: 190 lb 0.6 oz (86.2 kg) IBW/kg (Calculated) : 59.3 Adjusted Body Weight: 71.2kg  Vital Signs: Temp: 98.7 F (37.1 C) (05/12 1210) Temp Source: Oral (05/12 1210) BP: 97/52 mmHg (05/12 1210) Pulse Rate: 117 (05/12 1210) Intake/Output from previous day: 05/11 0701 - 05/12 0700 In: 4047.1 [I.V.:1297.1; TPN:2750] Out: 500 [Drains:500]  Labs:  Recent Labs  08/11/14 1220 08/12/14 0406 08/12/14 1101 08/13/14 0322  WBC 13.6* 13.2*  --  15.0*  HGB 6.8* 9.0*  --  9.1*  HCT 22.3* 29.1*  --  29.3*  PLT 302 279  --  288  APTT  --   --  36  --   INR  --   --  1.19  --     Recent Labs  08/11/14 0400  08/11/14 0415 08/11/14 1220 08/12/14 0406 08/13/14 0322  NA  --   < > 131* 127* 132* 129*  K  --   < > 3.8 3.9 3.9 4.2  CL  --   < > 95* 92* 97* 94*  CO2  --   < > 28 25 27 22   GLUCOSE  --   < > 166* 733* 145* 129*  BUN  --   < > 54* 11 30* 62*  CREATININE  --   < > 4.40* 1.46* 2.67* 4.25*  CALCIUM  --   < > 8.0* 7.0* 7.9* 8.3*  MG 2.0  --   --   --  1.7 1.7  PHOS  --   --  4.6  --  2.2* 4.2  PROT  --   --   --   --   --  6.9  ALBUMIN  --   --  1.6*  --  1.7* 1.7*  AST  --   --   --   --   --  23  ALT  --   --   --   --   --  14  ALKPHOS  --   --   --   --   --  170*  BILITOT  --   --   --   --   --  0.8  < > = values in this interval not displayed. Estimated Creatinine Clearance: 16 mL/min (by C-G formula based on Cr of 4.25).   Recent Labs  08/13/14 0254 08/13/14 0552 08/13/14 1200  GLUCAP 122* 131* 140*   Insulin Requirements in the past 24 hours:  4 unit Novolog SSI, no insulin in TPN  Current Nutrition:  Strict NPO To start Vital AF 1.2 at 10 ml/hr via J tube to provide 18 gm protein and 288 kcals Clinimix 5/15 (with electrolytes) at 140ml/hr and 20% IVFE at  66ml/hr which will provide 2184 kcal and 120 gm protein.  Nutritional Goals: (per RD note 4/28) 2100-2300 kCal, 120-140 grams of protein per day Goal: Clinimix 5/15 at 125ml/hr + 20% IVFE 9ml/hr to provide 2184 kcal and 120 gm protein  Assessment: 27 YOF with complicated hospital course. S/p closure perforated pyloric ulcer, SBR for ischemic necrosis, diffuse peritonitis on 07/01/14. Pharmacy consulted to provide TPN 4/1-4/15 for nutrition support for prolonged ileus. Pt then transitioned to po diet + supplements. 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  GI: Duodenal fistula. NPO. Open abd wound, (+)  abd pain. Octreotide drip ordered 5/10 am. Albumin 1.7. Prealbumin remains stable and low at 9.4 Noted palliative care discussions ongoing but for now pt/family wish for aggressive care. NGT out and pt refuses replacement.   5/10 add octreotide drip 5/11 IR placed gastro-jenjunal tube for post-pyloric feeding and drainage of gastric juices 5/12 to start Vital AF 1.2 trickle feedings via J tube at 10 ml/hr  Endo: h/o DM. had low CBGs prior to TPN start.  on IV synthroid. No insulin in TPN Lytes: lytes added to TPN Saturday 5/7 and taken out Monday 5/9;and re-added 5/11.   K+4.2 (gets 4 K bath at HD)  (AFib, goal >= 4)  Dr. Jonnie Finner wants pharmacy to manage K+. Na low at 129 from dilute TPN.  max Na in HD bath of 145.  Ca 8.3 , CoCa 10.14 Phos 4.2 (Naphos bolus ordered yesterday not given) , CaxPhos = ~43, < than 55 as desired. Mag 1.7, mag bolus ordered yesterday not given (goal for afib > = 2).  Renal: New ESRD - started 3/17. HD days changed to TTS. extra HD 5/10 to get phos/solute down.   Aranesp 100 qMon (decr from 150 to 100 on 5/2 2nd Hg 03/05/09.7). Hg 7.8 on 5/9, aranesp dose increased to 200 5/9  Pulm: asthma, COPD. RA. CT negative for PE. CCM signed off 5/3  Cards: HTN. (afib).  Midodrine and amio on hold (no NGT and NPO) IV metoprolol. CHADS-VASc  =4 (not good candidate for [facility]). Cards rec restart IV amio if SVT worsens  Hepatotobil: Alk phos slightly elevated with upward trend, other LFTs wnl. TG 141 (5/9)  Neuro: hx depression / insomnia - new stroke noted on 3/18.   ID: completed PO Vanc D#14/14on 5/7  for cdiff colitis, Diarrhea is better. WBC 15  Best Practices: SCDs, PPI IV  TPN Access: PICC line in IR 4/27 TPN start date: 4/1->4/15; restart 4/27>>  Plan:  -start trickle feeds via J tube with Vital Af 1.2 at 10 ml/hr. - continue Clinimix E 5/15 with electrolytes at 100/hr and stop IVFE.  TPN will provide 120 gm protein and 1704 kcals. TPN at 100 + TF at 10 ml/hr will provide 1922 kcals and 138 gm protein which is 100 % support. Evaristo Bury taken out on Monday 5/9 due to elevated CaxPhos product. Lytes added back Wednesday 5/11 due to low phos.  - 2 gm mag bolus for repletion -renal panel and mag in am -   Take Trace elements and MVI out of TPN. they will be provided by TF -  Continue sensitive SSI q6h  Eudelia Bunch, Pharm.D. [phone] 08/13/2014 1:39 PM

## 2014-08-13 NOTE — Progress Notes (Signed)
Physical Therapy Treatment Patient Details Name: [name] MRN: [id number] DOB: [birth year] Today's Date: 08/13/2014    History of Present Illness Leilanee Mikeal HawthorneC Faraj is a 59 y.o. female with a history of CKD and hypertension [facility]16 complaining of shortness of breath and generalized weakness x 2 weeks. Admitted with acute hypoxemic respiratory failure. During hospital stay noted to have weakness of her left side. 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  She developed septic shock 4/1 with pressors weaned off 4/3.  Began CRRT and pressors restarted 4/4; Developed pelvic abcess 4/7 with pelvic drain placed 4/8.  weaned off pressors 4/9.  Runs of SVT 4/5 and 4/11    PT Comments    Progressing better now.  Pt has improved both physically and mentally.  Could be a good CIR candidate now with these improvements.  Follow Up Recommendations  CIR     Equipment Recommendations       Recommendations for Other Services Rehab consult     Precautions / Restrictions Precautions Precautions: Fall Precaution Comments: JP drain attached to wall suction  Restrictions Weight Bearing Restrictions: No    Mobility  Bed Mobility Overal bed mobility: Needs Assistance Bed Mobility: Supine to Sit Rolling: Min assist Sidelying to sit: Min assist   Sit to supine: Mod assist;+2 for safety/equipment   General bed mobility comments: assisted trunk up to EOB wia L elbow.  Pt scooted the majority of the way forward with min guard, needing min assist at one point due to the soft nature of the bed overlay.  Transfers Overall transfer level: Needs assistance Equipment used: Rolling walker (2 wheeled) Transfers: Sit to/from Stand Sit to Stand: Mod assist;From elevated surface (min +2)         General transfer comment: cues for hand placement and assist to  come forward more than lift.  Ambulation/Gait Ambulation/Gait assistance: Max assist;+2 safety/equipment Ambulation Distance (Feet): 2 Feet (forward and back ) Assistive device: Rolling walker (2 wheeled) Gait Pattern/deviations: Step-to pattern     General Gait Details: significant assist for w/shift and stability, help control advance of the L LE/ gait hemiparetic in nature   Stairs            Wheelchair Mobility    Modified Rankin (Stroke Patients Only) Modified Rankin (Stroke Patients Only) Pre-Morbid Rankin Score: Slight disability Modified Rankin: Severe disability     Balance Overall balance assessment: Needs assistance Sitting-balance support: No upper extremity supported Sitting balance-Leahy Scale: Fair Sitting balance - Comments: much more stable sitting balance, able to use trunk to gain momentum for scoot.  pt could accept challenge posteriorly and to left, but had trouble with challenge to the right.     Standing balance-Leahy Scale: Poor Standing balance comment: much improved, but still needs RW and has tendency to list posteriorly.                    Cognition Arousal/Alertness: Awake/alert Behavior During Therapy: WFL for tasks assessed/performed Overall Cognitive Status: Within Functional Limits for tasks assessed                      Exercises General Exercises - Upper Extremity Shoulder Flexion: AAROM Other Exercises Other Exercises: warm up hip/knee flexion/ext exercise with resistance prior to mobility    General Comments  Pertinent Vitals/Pain Pain Assessment: Faces Faces Pain Scale: Hurts even more Pain Location: stomach Pain Descriptors / Indicators: Aching;Sore Pain Intervention(s): Monitored during session;Repositioned    Home Living                      Prior Function            PT Goals (current goals can now be found in the care plan section) Acute Rehab PT Goals Patient Stated Goal: to  get better  PT Goal Formulation: With patient Time For Goal Achievement: 08/19/14 Potential to Achieve Goals: Good Progress towards PT goals: Progressing toward goals    Frequency  Min 3X/week    PT Plan Discharge plan needs to be updated    Co-evaluation             End of Session   Activity Tolerance: Patient tolerated treatment well Patient left: in chair;with call bell/phone within reach;with chair alarm set     Time: 1343-1416 PT Time Calculation (min) (ACUTE ONLY): 33 min  Charges:  $Therapeutic Activity: 23-37 mins                    G Codes:      Keyli Duross, Eliseo GumKenneth V 08/13/2014, 2:43 PM 08/13/2014   BingKen Bertice Risse, PT [phone]  (pager)

## 2014-08-13 NOTE — Progress Notes (Signed)
[facility] Progress Note  43 Days Post-Op  Subjective: Pt in some discomfort.  Depressed.  No N/V, abdominal pain controlled.  Fidgity down at HD.  Drain output was down to 512mL/24hr.  Wants something to drink, but knows she can't.    Objective: Vital signs in last 24 hours: Temp:  [97.5 F (36.4 C)-98.1 F (36.7 C)] 98 F (36.7 C) (05/12 0700) Pulse Rate:  [57-118] 114 (05/12 1000) Resp:  [13-25] 24 (05/12 0715) BP: (91-127)/(6-93) 102/76 mmHg (05/12 1000) SpO2:  [92 %-100 %] 96 % (05/12 0700) Weight:  [87 kg (191 lb 12.8 oz)-91 kg (200 lb 9.9 oz)] 87 kg (191 lb 12.8 oz) (05/12 0700) Last BM Date: 07/31/14  Intake/Output from previous day: 05/11 0701 - 05/12 0700 In: 4047.1 [I.V.:1297.1; TPN:2750] Out: 500 [Drains:500] Intake/Output this shift:    PE: Gen: Alert, NAD, depressed Card: Tachycardic, monitor says v-tach, she is asymptomatic, no M/G/R heard Pulm: CTA, no W/R/R Abd: Soft, mildly tender, ND, +BS, no HSM, midline wound with yellow slough at base, some granulation tissue formation at edges, Blake drain in LLQ with 550mL/24hr bilious in color  Lab Results:   Recent Labs  08/12/14 0406 08/13/14 0322  WBC 13.2* 15.0*  HGB 9.0* 9.1*  HCT 29.1* 29.3*  PLT 279 288   BMET  Recent Labs  08/12/14 0406 08/13/14 0322  NA 132* 129*  K 3.9 4.2  CL 97* 94*  CO2 27 22  GLUCOSE 145* 129*  BUN 30* 62*  CREATININE 2.67* 4.25*  CALCIUM 7.9* 8.3*   PT/INR  Recent Labs  08/12/14 1101  LABPROT 15.2  INR 1.19   CMP     Component Value Date/Time   NA 129* 08/13/2014 0322   NA 139 10/02/2012 1046   K 4.2 08/13/2014 0322   K 4.4 10/02/2012 1046   CL 94* 08/13/2014 0322   CO2 22 08/13/2014 0322   CO2 25 10/02/2012 1046   GLUCOSE 129* 08/13/2014 0322   GLUCOSE 110 10/02/2012 1046   BUN 62* 08/13/2014 0322   BUN 35.2* 10/02/2012 1046   CREATININE 4.25* 08/13/2014 0322   CREATININE 2.5* 10/02/2012 1046   CALCIUM 8.3* 08/13/2014 0322    CALCIUM 9.8 10/02/2012 1046   PROT 6.9 08/13/2014 0322   PROT 8.4* 10/02/2012 1046   ALBUMIN 1.7* 08/13/2014 0322   ALBUMIN 3.1* 10/02/2012 1046   AST 23 08/13/2014 0322   AST 9 10/02/2012 1046   ALT 14 08/13/2014 0322   ALT <6 Repeated and Verified 10/02/2012 1046   ALKPHOS 170* 08/13/2014 0322   ALKPHOS 108 10/02/2012 1046   BILITOT 0.8 08/13/2014 0322   BILITOT 0.21 10/02/2012 1046   GFRNONAA 11* 08/13/2014 0322   GFRAA 12* 08/13/2014 0322   Lipase  No results found for: LIPASE     Studies/Results: Dg Chest 1 View  08/13/2014   CLINICAL DATA:  Shortness of breath  EXAM: CHEST  1 VIEW  COMPARISON:  08/03/2014  FINDINGS: Moderately severe cardiac enlargement. Central line in unchanged position on the right and left side. Mild vascular congestion with no evidence of edema effusion or consolidation.  IMPRESSION: No acute findings   Electronically Signed   By: Esperanza Heir M.D.   On: 08/13/2014 11:04   Ir Gastrostomy Tube Mod Sed  08/12/2014   CLINICAL DATA:  Perforated duodenal ulcer, post g patch with persistent leak. Percutaneous gastrojejunostomy as requested for gastric decompression and small bowel enteral feeding support.  EXAM: PERC PLACEMENT GASTROSTOMY;  CONVERT G-TUBE  TO G-JTUBE  FLUOROSCOPY TIME:  16 minutes 24 seconds, 1016.6 mGy  TECHNIQUE: The procedure, risks, benefits, and alternatives were explained to the patient. Questions regarding the procedure were encouraged and answered. The patient understands and consents to the procedure. As antibiotic prophylaxis, cefazolin 2 g was ordered pre-procedure and administered intravenously within one hour of incision. . A 5 French angiographic catheter was placed as orogastric tube. The upper abdomen was prepped with Betadine, draped in usual sterile fashion, and infiltrated locally with 1% lidocaine. 1 mg glucagon was administered intravenously to facilitate gastric distention and slow peristalsis. Stomach was insufflated using air  through the orogastric tube. An 19 French sheath needle was advanced percutaneously into the gastric lumen under fluoroscopy. Gas could be aspirated and a small contrast injection confirmed intraluminal spread. The sheath was exchanged over a guidewire for a 9 [place] vascular sheath, through which the snare device was advanced and used to snare a guidewire passed through the orogastric tube. This was withdrawn, and the snare attached to the 20 French pull-through gastrostomy tube, which was advanced antegrade, positioned with the internal bumper securing the anterior gastric wall to the anterior abdominal wall. Small contrast injection confirms appropriate positioning. The external bumper was applied and the catheter was flushed.  A 5 French angiographic catheter was placed coaxially through the gastrostomy tube and used to direct an angled stiff glidewire across the pylorus, beyond the proximal duodenal perforation, and into the proximal jejunum just beyond the ligament of Treitz. Over this, a coaxial jejunostomy feeding adapter was advanced. Contrast injection confirmed patency and appropriate position of the gastric and jejunal lumens. No extravasation. The lumens were flushed with saline and capped. The patient tolerated the procedure well.  COMPLICATIONS: COMPLICATIONS none  IMPRESSION: 1. Technically successful 20 French pull-through gastrostomy placement under fluoroscopy. 2. Technically successful coaxial jejunostomy feeding adapter placement to the ligament of Treitz.   Electronically Signed   By: Corlis Leak M.D.   On: 08/12/2014 16:12   Ir Adele Schilder Tamsen Snider Convert Gastr-jej Per W/fl Mod Sed  08/12/2014   CLINICAL DATA:  Perforated duodenal ulcer, post g patch with persistent leak. Percutaneous gastrojejunostomy as requested for gastric decompression and small bowel enteral feeding support.  EXAM: PERC PLACEMENT GASTROSTOMY;  CONVERT G-TUBE TO G-JTUBE  FLUOROSCOPY TIME:  16 minutes 24 seconds, 1016.6 mGy   TECHNIQUE: The procedure, risks, benefits, and alternatives were explained to the patient. Questions regarding the procedure were encouraged and answered. The patient understands and consents to the procedure. As antibiotic prophylaxis, cefazolin 2 g was ordered pre-procedure and administered intravenously within one hour of incision. . A 5 French angiographic catheter was placed as orogastric tube. The upper abdomen was prepped with Betadine, draped in usual sterile fashion, and infiltrated locally with 1% lidocaine. 1 mg glucagon was administered intravenously to facilitate gastric distention and slow peristalsis. Stomach was insufflated using air through the orogastric tube. An 87 French sheath needle was advanced percutaneously into the gastric lumen under fluoroscopy. Gas could be aspirated and a small contrast injection confirmed intraluminal spread. The sheath was exchanged over a guidewire for a 9 [place] vascular sheath, through which the snare device was advanced and used to snare a guidewire passed through the orogastric tube. This was withdrawn, and the snare attached to the 20 French pull-through gastrostomy tube, which was advanced antegrade, positioned with the internal bumper securing the anterior gastric wall to the anterior abdominal wall. Small contrast injection confirms appropriate positioning. The external bumper was applied and the  catheter was flushed.  A 5 French angiographic catheter was placed coaxially through the gastrostomy tube and used to direct an angled stiff glidewire across the pylorus, beyond the proximal duodenal perforation, and into the proximal jejunum just beyond the ligament of Treitz. Over this, a coaxial jejunostomy feeding adapter was advanced. Contrast injection confirmed patency and appropriate position of the gastric and jejunal lumens. No extravasation. The lumens were flushed with saline and capped. The patient tolerated the procedure well.  COMPLICATIONS:  COMPLICATIONS none  IMPRESSION: 1. Technically successful 20 French pull-through gastrostomy placement under fluoroscopy. 2. Technically successful coaxial jejunostomy feeding adapter placement to the ligament of Treitz.   Electronically Signed   By: Corlis Leak M.D.   On: 08/12/2014 16:12    Anti-infectives: Anti-infectives    Start     Dose/Rate Route Frequency Ordered Stop   08/12/14 1449  ceFAZolin (ANCEF) 2-3 GM-% IVPB SOLR    Comments:  Troy Sine   : cabinet override      08/12/14 1449 08/12/14 1629   08/12/14 1130  ceFAZolin (ANCEF) IVPB 2 g/50 mL premix    Comments:  Hang ON CALL to xray 5/11   2 g 100 mL/hr over 30 Minutes Intravenous To Radiology 08/12/14 1035 08/12/14 1520   08/04/14 1200  vancomycin (VANCOCIN) 50 mg/mL oral solution 125 mg  Status:  Discontinued     125 mg Oral 4 times per day 08/04/14 0851 08/08/14 1229   07/18/14 1800  vancomycin (VANCOCIN) 50 mg/mL oral solution 125 mg  Status:  Discontinued     125 mg Oral 4 times per day 07/18/14 1455 08/04/14 0845   07/17/14 1830  vancomycin (VANCOCIN) IVPB 750 mg/150 ml premix     750 mg 150 mL/hr over 60 Minutes Intravenous  Once 07/17/14 1818 07/18/14 0024   07/15/14 1400  metroNIDAZOLE (FLAGYL) tablet 500 mg  Status:  Discontinued     500 mg Oral 3 times per day 07/15/14 1202 07/18/14 1533   07/13/14 2200  piperacillin-tazobactam (ZOSYN) IVPB 2.25 g  Status:  Discontinued     2.25 g 100 mL/hr over 30 Minutes Intravenous 3 times per day 07/13/14 1318 07/24/14 1138   07/13/14 2000  fluconazole (DIFLUCAN) IVPB 200 mg  Status:  Discontinued     200 mg 100 mL/hr over 60 Minutes Intravenous Every 24 hours 07/13/14 1318 07/19/14 1027   07/07/14 1200  vancomycin (VANCOCIN) IVPB 1000 mg/200 mL premix  Status:  Discontinued     1,000 mg 200 mL/hr over 60 Minutes Intravenous Every 24 hours 07/07/14 0937 07/14/14 1712   07/06/14 2000  fluconazole (DIFLUCAN) IVPB 400 mg  Status:  Discontinued     400 mg 100 mL/hr over  120 Minutes Intravenous Every 24 hours 07/06/14 1507 07/13/14 1318   07/06/14 1800  piperacillin-tazobactam (ZOSYN) IVPB 2.25 g  Status:  Discontinued     2.25 g 100 mL/hr over 30 Minutes Intravenous 4 times per day 07/06/14 1505 07/13/14 1318   07/05/14 2000  fluconazole (DIFLUCAN) IVPB 200 mg  Status:  Discontinued     200 mg 100 mL/hr over 60 Minutes Intravenous Every 24 hours 07/05/14 0757 07/06/14 1507   07/05/14 1400  piperacillin-tazobactam (ZOSYN) IVPB 2.25 g  Status:  Discontinued     2.25 g 100 mL/hr over 30 Minutes Intravenous 3 times per day 07/05/14 0749 07/06/14 1505   07/02/14 1800  vancomycin (VANCOCIN) IVPB 1000 mg/200 mL premix  Status:  Discontinued     1,000 mg 200 mL/hr  over 60 Minutes Intravenous Every 24 hours 07/01/14 1858 07/05/14 0801   07/01/14 2200  piperacillin-tazobactam (ZOSYN) IVPB 3.375 g  Status:  Discontinued     3.375 g 100 mL/hr over 30 Minutes Intravenous 4 times per day 07/01/14 1858 07/05/14 0749   07/01/14 2000  fluconazole (DIFLUCAN) IVPB 400 mg  Status:  Discontinued     400 mg 100 mL/hr over 120 Minutes Intravenous Every 24 hours 07/01/14 1858 07/05/14 0757   07/01/14 1400  fluconazole (DIFLUCAN) IVPB 200 mg  Status:  Discontinued     200 mg 100 mL/hr over 60 Minutes Intravenous Every 24 hours 07/01/14 1330 07/01/14 1853   07/01/14 1000  piperacillin-tazobactam (ZOSYN) IVPB 2.25 g  Status:  Discontinued     2.25 g 100 mL/hr over 30 Minutes Intravenous Every 8 hours 07/01/14 0952 07/01/14 1853   07/01/14 1000  vancomycin (VANCOCIN) 500 mg in sodium chloride 0.9 % 100 mL IVPB     500 mg 100 mL/hr over 60 Minutes Intravenous  Once 07/01/14 0952 07/01/14 1126   06/30/14 1200  vancomycin (VANCOCIN) IVPB 1000 mg/200 mL premix     1,000 mg 200 mL/hr over 60 Minutes Intravenous  Once 06/30/14 0944 06/30/14 1403   06/30/14 1200  ceFEPIme (MAXIPIME) 2 g in dextrose 5 % 50 mL IVPB     2 g 100 mL/hr over 30 Minutes Intravenous  Once 06/30/14 0944  06/30/14 1425   06/30/14 0100  vancomycin (VANCOCIN) 2,000 mg in sodium chloride 0.9 % 500 mL IVPB     2,000 mg 250 mL/hr over 120 Minutes Intravenous  Once 06/30/14 0048 06/30/14 0525   06/30/14 0100  ceFEPIme (MAXIPIME) 2 g in dextrose 5 % 50 mL IVPB     2 g 100 mL/hr over 30 Minutes Intravenous  Once 06/30/14 0048 06/30/14 0322   06/27/14 0600  cefUROXime (ZINACEF) 1.5 g in dextrose 5 % 50 mL IVPB     1.5 g 100 mL/hr over 30 Minutes Intravenous On call to O.R. 06/26/14 1019 06/27/14 0630   06/19/14 1800  oseltamivir (TAMIFLU) capsule 30 mg     30 mg Oral Every M-W-F (1800) 06/19/14 1003 06/22/14 1841   06/18/14 1600  oseltamivir (TAMIFLU) capsule 30 mg  Status:  Discontinued     30 mg Oral Daily 06/18/14 1538 06/19/14 1003       Assessment/Plan POD #43 s/p Exploratory Laparotomy with graham patch closure of perforated pyloric ulcer, SBR for ischemic bowel - 07/01/2014 - Derrell LollingIngram  Intra-abdominal fluid collection, no drain  Now with duodenal fistula  -CT 08/03/14 showed 3.5x5.7x3.0cm pre-rectal fluid collection. No drain was replaced in this pelvic fluid collection after previous drain accidentally pulled out  -Fistula study done and shows JP drain near graham patch with connection to the duodenal bulb.  -Strict NPO, TPN, refusing NGT, output is bilious. Sandostatin helping? -Wound vac discontinued, now on NS WD dressing changes, switch to TID until cleaner then resume BID changes  -I&Os and routine drain care  -IR completed GJ tube on 08/12/14.  Now we can start trickle TF through J tube port only (beyond the LOT).  Monitor drain output.  If drain output increases may need to d/c TF.  Dietitian consult.    Inability to take PO/PCM - start trickle TF through J tube port VTE prophylaxis-SCDs  ID--Zosyn stopped on day #24 (07/24/14). WBC 15.0. May need repeat CT of abdomen to evaluate lower abdominal fluid collection  C diff colitis New ESRD  PCM/TNA  LOS: 57 days     DORT, Maanya Hippert 08/13/2014, 11:22 AM Pager: [phone]

## 2014-08-14 DIAGNOSIS — N186 End stage renal disease: Secondary | ICD-10-CM

## 2014-08-14 DIAGNOSIS — T82898A Other specified complication of vascular prosthetic devices, implants and grafts, initial encounter: Secondary | ICD-10-CM

## 2014-08-14 LAB — PHOSPHORUS
PHOSPHORUS: 4.8 mg/dL — AB (ref 2.5–4.6)
Phosphorus: 6.5 mg/dL — ABNORMAL HIGH (ref 2.5–4.6)

## 2014-08-14 LAB — CBC
HCT: 27.7 % — ABNORMAL LOW (ref 36.0–46.0)
Hemoglobin: 8.3 g/dL — ABNORMAL LOW (ref 12.0–15.0)
MCH: 27.3 pg (ref 26.0–34.0)
MCHC: 30 g/dL (ref 30.0–36.0)
MCV: 91.1 fL (ref 78.0–100.0)
Platelets: 223 10*3/uL (ref 150–400)
RBC: 3.04 MIL/uL — ABNORMAL LOW (ref 3.87–5.11)
RDW: 19.4 % — AB (ref 11.5–15.5)
WBC: 15.4 10*3/uL — ABNORMAL HIGH (ref 4.0–10.5)

## 2014-08-14 LAB — COMPREHENSIVE METABOLIC PANEL
ALK PHOS: 157 U/L — AB (ref 38–126)
ALT: 10 U/L — ABNORMAL LOW (ref 14–54)
ANION GAP: 9 (ref 5–15)
AST: 21 U/L (ref 15–41)
Albumin: 1.5 g/dL — ABNORMAL LOW (ref 3.5–5.0)
BUN: 43 mg/dL — ABNORMAL HIGH (ref 6–20)
CO2: 26 mmol/L (ref 22–32)
Calcium: 8 mg/dL — ABNORMAL LOW (ref 8.9–10.3)
Chloride: 93 mmol/L — ABNORMAL LOW (ref 101–111)
Creatinine, Ser: 3.33 mg/dL — ABNORMAL HIGH (ref 0.44–1.00)
GFR calc Af Amer: 16 mL/min — ABNORMAL LOW (ref >60)
GFR calc non Af Amer: 14 mL/min — ABNORMAL LOW (ref >60)
GLUCOSE: 649 mg/dL — AB (ref 65–99)
Potassium: 4.8 mmol/L (ref 3.5–5.1)
Sodium: 128 mmol/L — ABNORMAL LOW (ref 135–145)
TOTAL PROTEIN: 6.2 g/dL — AB (ref 6.5–8.1)
Total Bilirubin: 1.2 mg/dL (ref 0.3–1.2)

## 2014-08-14 LAB — BASIC METABOLIC PANEL
Anion gap: 10 (ref 5–15)
BUN: 50 mg/dL — AB (ref 6–20)
CALCIUM: 8.2 mg/dL — AB (ref 8.9–10.3)
CO2: 26 mmol/L (ref 22–32)
Chloride: 98 mmol/L — ABNORMAL LOW (ref 101–111)
Creatinine, Ser: 3.59 mg/dL — ABNORMAL HIGH (ref 0.44–1.00)
GFR calc non Af Amer: 13 mL/min — ABNORMAL LOW (ref >60)
GFR, EST AFRICAN AMERICAN: 15 mL/min — AB (ref >60)
GLUCOSE: 125 mg/dL — AB (ref 65–99)
POTASSIUM: 3.7 mmol/L (ref 3.5–5.1)
SODIUM: 134 mmol/L — AB (ref 135–145)

## 2014-08-14 LAB — GLUCOSE, CAPILLARY
GLUCOSE-CAPILLARY: 131 mg/dL — AB (ref 65–99)
Glucose-Capillary: 134 mg/dL — ABNORMAL HIGH (ref 65–99)
Glucose-Capillary: 153 mg/dL — ABNORMAL HIGH (ref 65–99)

## 2014-08-14 LAB — MAGNESIUM
MAGNESIUM: 2.7 mg/dL — AB (ref 1.7–2.4)
Magnesium: 2.5 mg/dL — ABNORMAL HIGH (ref 1.7–2.4)

## 2014-08-14 LAB — PROTIME-INR
INR: 1.28 (ref 0.00–1.49)
PROTHROMBIN TIME: 16.2 s — AB (ref 11.6–15.2)

## 2014-08-14 LAB — PARATHYROID HORMONE, INTACT (NO CA): PTH: 130 pg/mL — ABNORMAL HIGH (ref 15–65)

## 2014-08-14 MED ORDER — CLINIMIX/DEXTROSE (5/15) 5 % IV SOLN
INTRAVENOUS | Status: AC
  Administered 2014-08-14: 18:00:00 via INTRAVENOUS
  Filled 2014-08-14: qty 1992

## 2014-08-14 MED ORDER — FAT EMULSION 20 % IV EMUL
240.0000 mL | INTRAVENOUS | Status: AC
  Administered 2014-08-14: 240 mL via INTRAVENOUS
  Filled 2014-08-14: qty 250

## 2014-08-14 NOTE — Progress Notes (Signed)
Occupational Therapy Treatment Patient Details Name: [name] DOB: May 30, 1955 Today's Date: 08/14/2014    History of present illness [name] is a 59 y.o. female with a history of CKD and hypertension [facility] ED on 3/16 complaining of shortness of breath and generalized weakness x 2 weeks. Admitted with acute hypoxemic respiratory failure. During hospital stay noted to have weakness of her left side. 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  She developed septic shock 4/1 with pressors weaned off 4/3.  Began CRRT and pressors restarted 4/4; Developed pelvic abcess 4/7 with pelvic drain placed 4/8.  weaned off pressors 4/9.  Runs of SVT 4/5 and 4/11   OT comments  Pt slowly progressing but continues to be limited by fatigue and deconditioning. Pt participated in EOB activities to promote dynamic balance through facilitating trunk control. Pt also participated in scapular mobilization to promote ROM and functional use of UE.    Follow Up Recommendations  SNF;Supervision/Assistance - 24 hour    Equipment Recommendations  Other (comment) (TBD in next venue)    Recommendations for Other Services      Precautions / Restrictions Precautions Precautions: Fall Restrictions Weight Bearing Restrictions: No       Mobility Bed Mobility Overal bed mobility: Needs Assistance Bed Mobility: Sit to Supine;Rolling;Sidelying to Sit Rolling: Min assist Sidelying to sit: Min assist   Sit to supine: Mod assist   General bed mobility comments: Min A to roll to Right side and elevate trunk off bed. Verbal encouragement to complete. Pt required mod A to return to supine.   Transfers                 General transfer comment: not addressed due to fatigue    Balance Overall balance assessment: Needs assistance Sitting-balance  support: No upper extremity supported;Feet supported Sitting balance-Leahy Scale: Good Sitting balance - Comments: pt was able to resist challenge to right, left, and posterior. Practiced weight shift without UE support.                            ADL Overall ADL's : Needs assistance/impaired Eating/Feeding: NPO   Grooming: Wash/dry hands;Wash/dry face;Set up;Sitting   Upper Body Bathing: Minimal assitance;Sitting Upper Body Bathing Details (indicate cue type and reason): encouragement to complete task     Upper Body Dressing : Moderate assistance;Sitting                     General ADL Comments: Pt sat EOB ~20 minutes to facilitate trunk control. Pt has good static sitting balance and promoted dynamic balance through reaching to apply lotion to LEs, weight shift without UE support, and trunk rotation. Pt participated in scapular mobilization to promote full ROM and functional UE use.       Vision                 Additional Comments: pt makes limited eye contact'          Cognition  Arousal/Alertness: Awake/Alert Behavior During Therapy: Flat affect Overall Cognitive Status: Within Functional Limits for tasks assessed                         Exercises General Exercises - Upper Extremity Shoulder Flexion: AAROM;Left;10 reps;Seated (therapist  resisting left scapular elevation) Other Exercises Other Exercises: scapular elevation x5, depressionx5, retractionx5           Pertinent Vitals/ Pain       Pain Assessment: Faces Faces Pain Scale: Hurts even more Pain Location: stomach, L foot (top) Pain Descriptors / Indicators: Aching;Sore Pain Intervention(s): Monitored during session;Repositioned;Limited activity within patient's tolerance         Frequency Min 2X/week     Progress Toward Goals  OT Goals(current goals can now be found in the care plan section)  Progress towards OT goals: Progressing toward goals (slowly due to  fatigue)  Acute Rehab OT Goals Patient Stated Goal: to get back to normal OT Goal Formulation: With patient Time For Goal Achievement: 08/16/14 Potential to Achieve Goals: Fair  Plan Discharge plan remains appropriate       End of Session     Activity Tolerance Patient limited by fatigue   Patient Left in bed;with call bell/phone within reach;with bed alarm set;with SCD's reapplied   Nurse Communication          Time: [id number] OT Time Calculation (min): 39 min  Charges: OT General Charges $OT Visit: 1 Procedure OT Treatments $Self Care/Home Management : 8-22 mins $Therapeutic Activity: 23-37 mins  [name] 08/14/2014, 12:32 PM  [name], OTR/L Occupational Therapist [phone] (pager)

## 2014-08-14 NOTE — Progress Notes (Signed)
PARENTERAL NUTRITION CONSULT NOTE - FOLLOW UP  Pharmacy Consult for TPN Indication:  High output Duodenal Fistula  No Known Allergies  Patient Measurements: Height: 5\' 6"  (167.6 cm) Weight: 198 lb 6.6 oz (90 kg) IBW/kg (Calculated) : 59.3 Adjusted Body Weight: 71.2kg  Vital Signs: Temp: 97.9 F (36.6 C) (05/13 0435) Temp Source: Oral (05/13 0435) BP: 110/60 mmHg (05/12 1929) Pulse Rate: 106 (05/12 1929) Intake/Output from previous day: 05/12 0701 - 05/13 0700 In: 2872.5 [I.V.:557.5; NG/GT:98.5; TPN:2216.5] Out: 2603 [Drains:1103]  Labs:  Recent Labs  08/12/14 0406 08/12/14 1101 08/13/14 0322 08/14/14 0615  WBC 13.2*  --  15.0* 15.4*  HGB 9.0*  --  9.1* 8.3*  HCT 29.1*  --  29.3* 27.7*  PLT 279  --  288 223  APTT  --  36  --   --   INR  --  1.19  --  1.28    Recent Labs  08/11/14 1220 08/12/14 0406 08/13/14 0322  NA 127* 132* 129*  K 3.9 3.9 4.2  CL 92* 97* 94*  CO2 25 27 22   GLUCOSE 733* 145* 129*  BUN 11 30* 62*  CREATININE 1.46* 2.67* 4.25*  CALCIUM 7.0* 7.9* 8.3*  MG  --  1.7 1.7  PHOS  --  2.2* 4.2  PROT  --   --  6.9  ALBUMIN  --  1.7* 1.7*  AST  --   --  23  ALT  --   --  14  ALKPHOS  --   --  170*  BILITOT  --   --  0.8   Estimated Creatinine Clearance: 16.3 mL/min (by C-G formula based on Cr of 4.25).   Recent Labs  08/13/14 1726 08/13/14 2336 08/14/14 0528  GLUCAP 180* 148* 131*   Insulin Requirements in the past 24 hours:  4 unit Novolog SSI, no insulin in TPN  Current Nutrition:  Vital AF 1.2 at 10 ml/hr via J tube provides ~18 gm protein and 288 kcals Clinimix 5/15 (with electrolytes) at 141ml/hr and 20% IVFE at 75ml/hr which will provide 2184 kcal and 120 gm protein.  Nutritional Goals: (per RD note 5/12) 2100-2300 kCal, 120-140 grams of protein per day Goal: Clinimix 5/15 at 122ml/hr + 20% IVFE 24ml/hr to provide 2184 kcal and 120 gm protein  Assessment: 43 YOF with complicated hospital course. S/p closure perforated  pyloric ulcer, SBR for ischemic necrosis, diffuse peritonitis on 07/01/14. Pharmacy consulted to provide TPN 4/1-4/15 for nutrition support for prolonged ileus. Pt then transitioned to po diet + supplements. 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  GI: Duodenal fistula. NPO. Open abd wound, (+)abd pain. Octreotide drip ordered 5/10 am. Albumin 1.7. Prealbumin remains stable and low at 9.4 Noted palliative care discussions ongoing but for now pt/family wish for aggressive care. Trickle feeds started 5/12 - tolerating with minimal residuals. ~678 of drain output/24h.    5/10 Add octreotide drip 5/11 IR placed gastro-jenjunal tube for post-pyloric feeding and drainage of gastric juices 5/12 to start Vital AF 1.2 trickle feedings via J tube at 10 ml/hr 5/13 Continues on trickle feeds at 10 ml/hr  Endo: Hx DM/hypothyroidism. Had low CBGs prior to TPN start, current CBGs/24h: 131-180. No insulin in TPN, on sensitive SSI  Lytes: Lytes added to TPN Saturday 5/7 and taken out Monday 5/9;and re-added 5/11. First set of labs on 5/12 AM drawn from same site TPN infusing so not accurate - repeat labs show Na  134, K 3.7, Phos 4.8 (elevated), Mg 2.5 << 1.7 (s/p 2g on 5/12) , CoCa~10.2. Ca*Phos product <55 however given elevated Phos - will remove lytes in bag today. May need to consider alternating between lytes and no lytes.  Per discussion with renal Jonnie Finner) - wants pharmacy to manage K - will hold replacement for now and consider if remains low on 5/14.   Renal: New ESRD - started 3/17. HD days changed to TTS. S/p extra session on 5/10. Last HD on 5/12 (4 hr BFR 300), planning again on 5/14.   Heme: Anemia of ESRD. Hgb 8.3, on Aranesp 200 mcg-Tues  Pulm: Hx asthma/COPD. RA. CT negative for PE. CCM signed off 5/3  Cards: HTN. (afib).  Midodrine and amio on hold (no NGT and NPO) IV metoprolol. CHADS-VASc =4 (not good candidate for [facility]). Cards rec restart IV amio if SVT  worsens  Hepatotobil: Alk phos elevated 157 << 170, LFTs/Tbili okay, TG 141 (5/9)  Neuro: Hx depression / insomnia - new stroke noted on 3/18.   ID: completed PO Vanc D#14/14on 5/7  for cdiff colitis, Diarrhea is better. WBC 15  Best Practices: SCDs, PPI IV  TPN Access: PICC line in IR 4/27 TPN start date: 4/1->4/15; restart 4/27>>  Plan:  - Change to Clinimix 5/15 (no lytes) and reduce rate to 83 ml/hr + IVFE at 10 ml/hr in additional to trickle feeds will provide 2182 kcal + 118 g protein (100% kcal and 98% protein requirements) - Will continue to monitor TF tolerance and titration - Will hold TE + MV in TPN as provided in TF - Continue sensitive SSI - Will f/u TPN labs, BMET/Mg/Phos in AM  Alycia Rossetti, PharmD, BCPS Clinical Pharmacist Pager: [phone] 08/14/2014 8:15 AM

## 2014-08-14 NOTE — Progress Notes (Signed)
Chaplain Note:  Ms. Keech exprLowell Guitaressed "I don't want to talk right now,". I asked if she desired for me to return Monday, she responded "Monday would be worse".   I noted that at anytime she felt like talking, a Chaplain would come on her time.   Gala RomneyBrown, Miu Chiong J, Chaplain 08/14/2014

## 2014-08-14 NOTE — Progress Notes (Signed)
Patient refused CPAP at this time.

## 2014-08-14 NOTE — Progress Notes (Signed)
Chaplain Note:   Chaplain received consult to visit with [name]. [name] was occupied at the time. Will return to visit with her before 5pm   Vickii PennaBrown, Antwoin Lackey J, Chaplain 08/14/2014

## 2014-08-14 NOTE — Progress Notes (Signed)
[facility] Progress Note  44 Days Post-Op  Subjective: Pt feels okay, pain improving.  No N/V.  Wants to eat badly.  Some flatus, no BM since 07/31/14.  Working with therapies.    Objective: Vital signs in last 24 hours: Temp:  [97.6 F (36.4 C)-98.5 F (36.9 C)] 97.6 F (36.4 C) (05/13 1500) Pulse Rate:  [79-106] 102 (05/13 1601) Resp:  [16-20] 16 (05/13 1444) BP: (83-110)/(56-71) 100/69 mmHg (05/13 1444) SpO2:  [79 %-98 %] 98 % (05/13 1601) Weight:  [90 kg (198 lb 6.6 oz)] 90 kg (198 lb 6.6 oz) (05/13 0403) Last BM Date: 07/31/14  Intake/Output from previous day: 05/12 0701 - 05/13 0700 In: 2872.5 [I.V.:557.5; NG/GT:98.5; TPN:2216.5] Out: 2603 [Drains:1103] Intake/Output this shift: Total I/O In: 738.4 [I.V.:91.3; NG/GT:58.8; TPN:588.3] Out: 700 [Drains:700]  PE: Gen: Alert, NAD, depressed Abd: Soft, mildly tender, ND, +BS, no HSM, midline wound with yellow slough at base and some purulent looking drainage , some granulation tissue formation at edges, Blake drain in LLQ with 730mL/24hr bilious in color  Lab Results:   Recent Labs  08/13/14 0322 08/14/14 0615  WBC 15.0* 15.4*  HGB 9.1* 8.3*  HCT 29.3* 27.7*  PLT 288 223   BMET  Recent Labs  08/14/14 0615 08/14/14 1029  NA 128* 134*  K 4.8 3.7  CL 93* 98*  CO2 26 26  GLUCOSE 649* 125*  BUN 43* 50*  CREATININE 3.33* 3.59*  CALCIUM 8.0* 8.2*   PT/INR  Recent Labs  08/12/14 1101 08/14/14 0615  LABPROT 15.2 16.2*  INR 1.19 1.28   CMP     Component Value Date/Time   NA 134* 08/14/2014 1029   NA 139 10/02/2012 1046   K 3.7 08/14/2014 1029   K 4.4 10/02/2012 1046   CL 98* 08/14/2014 1029   CO2 26 08/14/2014 1029   CO2 25 10/02/2012 1046   GLUCOSE 125* 08/14/2014 1029   GLUCOSE 110 10/02/2012 1046   BUN 50* 08/14/2014 1029   BUN 35.2* 10/02/2012 1046   CREATININE 3.59* 08/14/2014 1029   CREATININE 2.5* 10/02/2012 1046   CALCIUM 8.2* 08/14/2014 1029   CALCIUM 9.8 10/02/2012  1046   PROT 6.2* 08/14/2014 0615   PROT 8.4* 10/02/2012 1046   ALBUMIN 1.5* 08/14/2014 0615   ALBUMIN 3.1* 10/02/2012 1046   AST 21 08/14/2014 0615   AST 9 10/02/2012 1046   ALT 10* 08/14/2014 0615   ALT <6 Repeated and Verified 10/02/2012 1046   ALKPHOS 157* 08/14/2014 0615   ALKPHOS 108 10/02/2012 1046   BILITOT 1.2 08/14/2014 0615   BILITOT 0.21 10/02/2012 1046   GFRNONAA 13* 08/14/2014 1029   GFRAA 15* 08/14/2014 1029   Lipase  No results found for: LIPASE     Studies/Results: Dg Chest 1 View  08/13/2014   CLINICAL DATA:  Shortness of breath  EXAM: CHEST  1 VIEW  COMPARISON:  08/03/2014  FINDINGS: Moderately severe cardiac enlargement. Central line in unchanged position on the right and left side. Mild vascular congestion with no evidence of edema effusion or consolidation.  IMPRESSION: No acute findings   Electronically Signed   By: Esperanza Heir M.D.   On: 08/13/2014 11:04    Anti-infectives: Anti-infectives    Start     Dose/Rate Route Frequency Ordered Stop   08/12/14 1449  ceFAZolin (ANCEF) 2-3 GM-% IVPB SOLR    Comments:  Troy Sine   : cabinet override      08/12/14 1449 08/12/14 1629   08/12/14 1130  ceFAZolin (ANCEF) IVPB 2 g/50 mL premix    Comments:  Hang ON CALL to xray 5/11   2 g 100 mL/hr over 30 Minutes Intravenous To Radiology 08/12/14 1035 08/12/14 1520   08/04/14 1200  vancomycin (VANCOCIN) 50 mg/mL oral solution 125 mg  Status:  Discontinued     125 mg Oral 4 times per day 08/04/14 0851 08/08/14 1229   07/18/14 1800  vancomycin (VANCOCIN) 50 mg/mL oral solution 125 mg  Status:  Discontinued     125 mg Oral 4 times per day 07/18/14 1455 08/04/14 0845   07/17/14 1830  vancomycin (VANCOCIN) IVPB 750 mg/150 ml premix     750 mg 150 mL/hr over 60 Minutes Intravenous  Once 07/17/14 1818 07/18/14 0024   07/15/14 1400  metroNIDAZOLE (FLAGYL) tablet 500 mg  Status:  Discontinued     500 mg Oral 3 times per day 07/15/14 1202 07/18/14 1533   07/13/14  2200  piperacillin-tazobactam (ZOSYN) IVPB 2.25 g  Status:  Discontinued     2.25 g 100 mL/hr over 30 Minutes Intravenous 3 times per day 07/13/14 1318 07/24/14 1138   07/13/14 2000  fluconazole (DIFLUCAN) IVPB 200 mg  Status:  Discontinued     200 mg 100 mL/hr over 60 Minutes Intravenous Every 24 hours 07/13/14 1318 07/19/14 1027   07/07/14 1200  vancomycin (VANCOCIN) IVPB 1000 mg/200 mL premix  Status:  Discontinued     1,000 mg 200 mL/hr over 60 Minutes Intravenous Every 24 hours 07/07/14 0937 07/14/14 1712   07/06/14 2000  fluconazole (DIFLUCAN) IVPB 400 mg  Status:  Discontinued     400 mg 100 mL/hr over 120 Minutes Intravenous Every 24 hours 07/06/14 1507 07/13/14 1318   07/06/14 1800  piperacillin-tazobactam (ZOSYN) IVPB 2.25 g  Status:  Discontinued     2.25 g 100 mL/hr over 30 Minutes Intravenous 4 times per day 07/06/14 1505 07/13/14 1318   07/05/14 2000  fluconazole (DIFLUCAN) IVPB 200 mg  Status:  Discontinued     200 mg 100 mL/hr over 60 Minutes Intravenous Every 24 hours 07/05/14 0757 07/06/14 1507   07/05/14 1400  piperacillin-tazobactam (ZOSYN) IVPB 2.25 g  Status:  Discontinued     2.25 g 100 mL/hr over 30 Minutes Intravenous 3 times per day 07/05/14 0749 07/06/14 1505   07/02/14 1800  vancomycin (VANCOCIN) IVPB 1000 mg/200 mL premix  Status:  Discontinued     1,000 mg 200 mL/hr over 60 Minutes Intravenous Every 24 hours 07/01/14 1858 07/05/14 0801   07/01/14 2200  piperacillin-tazobactam (ZOSYN) IVPB 3.375 g  Status:  Discontinued     3.375 g 100 mL/hr over 30 Minutes Intravenous 4 times per day 07/01/14 1858 07/05/14 0749   07/01/14 2000  fluconazole (DIFLUCAN) IVPB 400 mg  Status:  Discontinued     400 mg 100 mL/hr over 120 Minutes Intravenous Every 24 hours 07/01/14 1858 07/05/14 0757   07/01/14 1400  fluconazole (DIFLUCAN) IVPB 200 mg  Status:  Discontinued     200 mg 100 mL/hr over 60 Minutes Intravenous Every 24 hours 07/01/14 1330 07/01/14 1853   07/01/14  1000  piperacillin-tazobactam (ZOSYN) IVPB 2.25 g  Status:  Discontinued     2.25 g 100 mL/hr over 30 Minutes Intravenous Every 8 hours 07/01/14 0952 07/01/14 1853   07/01/14 1000  vancomycin (VANCOCIN) 500 mg in sodium chloride 0.9 % 100 mL IVPB     500 mg 100 mL/hr over 60 Minutes Intravenous  Once 07/01/14 0952 07/01/14 1126  06/30/14 1200  vancomycin (VANCOCIN) IVPB 1000 mg/200 mL premix     1,000 mg 200 mL/hr over 60 Minutes Intravenous  Once 06/30/14 0944 06/30/14 1403   06/30/14 1200  ceFEPIme (MAXIPIME) 2 g in dextrose 5 % 50 mL IVPB     2 g 100 mL/hr over 30 Minutes Intravenous  Once 06/30/14 0944 06/30/14 1425   06/30/14 0100  vancomycin (VANCOCIN) 2,000 mg in sodium chloride 0.9 % 500 mL IVPB     2,000 mg 250 mL/hr over 120 Minutes Intravenous  Once 06/30/14 0048 06/30/14 0525   06/30/14 0100  ceFEPIme (MAXIPIME) 2 g in dextrose 5 % 50 mL IVPB     2 g 100 mL/hr over 30 Minutes Intravenous  Once 06/30/14 0048 06/30/14 0322   06/27/14 0600  cefUROXime (ZINACEF) 1.5 g in dextrose 5 % 50 mL IVPB     1.5 g 100 mL/hr over 30 Minutes Intravenous On call to O.R. 06/26/14 1019 06/27/14 0630   06/19/14 1800  oseltamivir (TAMIFLU) capsule 30 mg     30 mg Oral Every M-W-F (1800) 06/19/14 1003 06/22/14 1841   06/18/14 1600  oseltamivir (TAMIFLU) capsule 30 mg  Status:  Discontinued     30 mg Oral Daily 06/18/14 1538 06/19/14 1003       Assessment/Plan POD #45 s/p Exploratory Laparotomy with graham patch closure of perforated pyloric ulcer, SBR for ischemic bowel - 07/01/2014 - [name]  Intra-abdominal fluid collection, no drain   Now with duodenal fistula -CT 08/03/14 showed 3.5x5.7x3.0cm pre-rectal fluid collection. No drain was replaced in this pelvic fluid collection after previous drain accidentally pulled out  -Fistula study done and shows JP drain near graham patch with connection to the duodenal bulb.  -Strict NPO, TPN, output is bilious. Sandostatin helping? -Wound vac  discontinued, now on NS WD dressing changes TID due to significant drainage, order repeat wound culture -I&Os and routine drain care (700mL bilious output) -IR completed GJ tube on 08/12/14. Continue trickle TF through J tube port only (beyond the LOT), advance tomorrow?. Monitor drain output. If drain output increases may need to d/c TF. Dietitian following.   Inability to take PO/PCM - continue TF through J tube port VTE prophylaxis-SCDs  ID--Zosyn stopped on day #24 (07/24/14). WBC 15.4. Ordered repeat CT scan for tomorrow with oral contrast through g-tube and j-tube site C diff colitis New ESRD  PCM/TNA  - at some point can wean TNA if she tolerates TF    LOS: 58 days    DORT, Jelitza Manninen 08/14/2014, 4:05 PM Pager: [phone]

## 2014-08-14 NOTE — Progress Notes (Signed)
S: Some abd pain around G tube O:BP 110/60 mmHg  Pulse 106  Temp(Src) 97.9 F (36.6 C) (Oral)  Resp 20  Ht 5\' 6"  (1.676 m)  Wt 90 kg (198 lb 6.6 oz)  BMI 32.04 kg/m2  SpO2 92%  Intake/Output Summary (Last 24 hours) at 08/14/14 0749 Last data filed at 08/14/14 0646  Gross per 24 hour  Intake 2872.49 ml  Output   2603 ml  Net 269.49 ml   Weight change: -4.8 kg (-10 lb 9.3 oz) ZOX:WRUEAGen:awake and alert CVS: RRR Resp: Rare wheeze wheezes Abd: RUQ drain.  G tube Ext: no edema.  LUA AVF + bruit NEURO: Decreased strength in Lt upper and lower ext. Ox3 No asterixis Rt IJ permcath   . antiseptic oral rinse  7 mL Mouth Rinse q12n4p  . budesonide (PULMICORT) nebulizer solution  0.25 mg Nebulization BID  . chlorhexidine  15 mL Mouth Rinse BID  . [START ON 08/18/2014] darbepoetin (ARANESP) injection - DIALYSIS  200 mcg Intravenous Q Tue-HD  . ferric gluconate (FERRLECIT/NULECIT) IV  125 mg Intravenous Q [name],Th,Sa-HD  . insulin aspart  0-9 Units Subcutaneous 4 times per day  . levothyroxine  12.5 mcg Intravenous Daily  . metoprolol  2.5 mg Intravenous 3 times per day  . sodium chloride  10-40 mL Intracatheter Q12H  . sodium phosphate  Dextrose 5% IVPB  10 mmol Intravenous Once   Dg Chest 1 View  08/13/2014   CLINICAL DATA:  Shortness of breath  EXAM: CHEST  1 VIEW  COMPARISON:  08/03/2014  FINDINGS: Moderately severe cardiac enlargement. Central line in unchanged position on the right and left side. Mild vascular congestion with no evidence of edema effusion or consolidation.  IMPRESSION: No acute findings   Electronically Signed   By: Esperanza Heiraymond  Rubner M.D.   On: 08/13/2014 11:04   Ir Gastrostomy Tube Mod Sed  08/12/2014   CLINICAL DATA:  Perforated duodenal ulcer, post g patch with persistent leak. Percutaneous gastrojejunostomy as requested for gastric decompression and small bowel enteral feeding support.  EXAM: PERC PLACEMENT GASTROSTOMY;  CONVERT G-TUBE TO G-JTUBE  FLUOROSCOPY TIME:  16  minutes 24 seconds, 1016.6 mGy  TECHNIQUE: The procedure, risks, benefits, and alternatives were explained to the patient. Questions regarding the procedure were encouraged and answered. The patient understands and consents to the procedure. As antibiotic prophylaxis, cefazolin 2 g was ordered pre-procedure and administered intravenously within one hour of incision. . A 5 French angiographic catheter was placed as orogastric tube. The upper abdomen was prepped with Betadine, draped in usual sterile fashion, and infiltrated locally with 1% lidocaine. 1 mg glucagon was administered intravenously to facilitate gastric distention and slow peristalsis. Stomach was insufflated using air through the orogastric tube. An 418 French sheath needle was advanced percutaneously into the gastric lumen under fluoroscopy. Gas could be aspirated and a small contrast injection confirmed intraluminal spread. The sheath was exchanged over a guidewire for a 9 [place] vascular sheath, through which the snare device was advanced and used to snare a guidewire passed through the orogastric tube. This was withdrawn, and the snare attached to the 20 French pull-through gastrostomy tube, which was advanced antegrade, positioned with the internal bumper securing the anterior gastric wall to the anterior abdominal wall. Small contrast injection confirms appropriate positioning. The external bumper was applied and the catheter was flushed.  A 5 French angiographic catheter was placed coaxially through the gastrostomy tube and used to direct an angled stiff glidewire across the pylorus, beyond the  proximal duodenal perforation, and into the proximal jejunum just beyond the ligament of Treitz. Over this, a coaxial jejunostomy feeding adapter was advanced. Contrast injection confirmed patency and appropriate position of the gastric and jejunal lumens. No extravasation. The lumens were flushed with saline and capped. The patient tolerated the procedure  well.  COMPLICATIONS: COMPLICATIONS none  IMPRESSION: 1. Technically successful 20 French pull-through gastrostomy placement under fluoroscopy. 2. Technically successful coaxial jejunostomy feeding adapter placement to the ligament of Treitz.   Electronically Signed   By: Corlis Leak  Hassell M.D.   On: 08/12/2014 16:12   Ir Adele SchilderGastr Tamsen Sniderube Convert Gastr-jej Per W/fl Mod Sed  08/12/2014   CLINICAL DATA:  Perforated duodenal ulcer, post g patch with persistent leak. Percutaneous gastrojejunostomy as requested for gastric decompression and small bowel enteral feeding support.  EXAM: PERC PLACEMENT GASTROSTOMY;  CONVERT G-TUBE TO G-JTUBE  FLUOROSCOPY TIME:  16 minutes 24 seconds, 1016.6 mGy  TECHNIQUE: The procedure, risks, benefits, and alternatives were explained to the patient. Questions regarding the procedure were encouraged and answered. The patient understands and consents to the procedure. As antibiotic prophylaxis, cefazolin 2 g was ordered pre-procedure and administered intravenously within one hour of incision. . A 5 French angiographic catheter was placed as orogastric tube. The upper abdomen was prepped with Betadine, draped in usual sterile fashion, and infiltrated locally with 1% lidocaine. 1 mg glucagon was administered intravenously to facilitate gastric distention and slow peristalsis. Stomach was insufflated using air through the orogastric tube. An 10318 French sheath needle was advanced percutaneously into the gastric lumen under fluoroscopy. Gas could be aspirated and a small contrast injection confirmed intraluminal spread. The sheath was exchanged over a guidewire for a 9 [place] vascular sheath, through which the snare device was advanced and used to snare a guidewire passed through the orogastric tube. This was withdrawn, and the snare attached to the 20 French pull-through gastrostomy tube, which was advanced antegrade, positioned with the internal bumper securing the anterior gastric wall to the anterior  abdominal wall. Small contrast injection confirms appropriate positioning. The external bumper was applied and the catheter was flushed.  A 5 French angiographic catheter was placed coaxially through the gastrostomy tube and used to direct an angled stiff glidewire across the pylorus, beyond the proximal duodenal perforation, and into the proximal jejunum just beyond the ligament of Treitz. Over this, a coaxial jejunostomy feeding adapter was advanced. Contrast injection confirmed patency and appropriate position of the gastric and jejunal lumens. No extravasation. The lumens were flushed with saline and capped. The patient tolerated the procedure well.  COMPLICATIONS: COMPLICATIONS none  IMPRESSION: 1. Technically successful 20 French pull-through gastrostomy placement under fluoroscopy. 2. Technically successful coaxial jejunostomy feeding adapter placement to the ligament of Treitz.   Electronically Signed   By: Corlis Leak  Hassell M.D.   On: 08/12/2014 16:12   BMET    Component Value Date/Time   NA 128* 08/14/2014 0615   NA 139 10/02/2012 1046   K 4.8 08/14/2014 0615   K 4.4 10/02/2012 1046   CL 93* 08/14/2014 0615   CO2 26 08/14/2014 0615   CO2 25 10/02/2012 1046   GLUCOSE 649* 08/14/2014 0615   GLUCOSE 110 10/02/2012 1046   BUN 43* 08/14/2014 0615   BUN 35.2* 10/02/2012 1046   CREATININE 3.33* 08/14/2014 0615   CREATININE 2.5* 10/02/2012 1046   CALCIUM 8.0* 08/14/2014 0615   CALCIUM 9.8 10/02/2012 1046   GFRNONAA 14* 08/14/2014 0615   GFRAA 16* 08/14/2014 0615   CBC  Component Value Date/Time   WBC 15.4* 08/14/2014 0615   WBC 6.8 10/02/2012 1046   RBC 3.04* 08/14/2014 0615   RBC 4.87 10/02/2012 1046   HGB 8.3* 08/14/2014 0615   HGB 13.1 10/02/2012 1046   HCT 27.7* 08/14/2014 0615   HCT 40.3 10/02/2012 1046   PLT 223 08/14/2014 0615   PLT 359 10/02/2012 1046   MCV 91.1 08/14/2014 0615   MCV 82.6 10/02/2012 1046   MCH 27.3 08/14/2014 0615   MCH 26.8 10/02/2012 1046   MCHC 30.0  08/14/2014 0615   MCHC 32.5 10/02/2012 1046   RDW 19.4* 08/14/2014 0615   RDW 17.4* 10/02/2012 1046   LYMPHSABS 2.0 08/10/2014 0410   LYMPHSABS 1.9 10/02/2012 1046   MONOABS 1.8* 08/10/2014 0410   MONOABS 0.8 10/02/2012 1046   EOSABS 0.5 08/10/2014 0410   EOSABS 0.2 10/02/2012 1046   BASOSABS 0.2* 08/10/2014 0410   BASOSABS 0.1 10/02/2012 1046     Assessment: 1. CVA with Lt hemiparesis 2. Perf pyloric ulcer SP Exp lap.  Now with doudenal fistula 3. C diff 4. Mild hyponatremia 5. Anemia on aranesp 6 Sec HPTH last pth was march. Not on Vit D 7. HypoPO4, resolved 8. Paroxysmal SVT 9. 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  Plan HD tomorrow and change to TTS schedule for now 2.  Please advance tube feedings so she can get off TNA   [name]

## 2014-08-14 NOTE — Progress Notes (Signed)
UR COMPLETED  

## 2014-08-14 NOTE — Progress Notes (Signed)
Transfer to tele 5/13  [name] ZOX:[id number] DOB: [birth year] DOA: 06/17/2014 PCP: Willey BladeEAN, [name], MD  Admit HPI / Brief Narrative: 59 yo ? 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 [name], failed L AV fistula, initially admitted 3/16 with flu and acute hypoxic resp failure. Ultimately tx to [facility] due to need for HD L sided weakness 3/18 and found to have R ACA stroke-showed acute infarct R ACA  Patient ? lethargic 3/30 and CT abd=perf viscus 3/31 was taken to the OR for ex lap for perforated pyloric ulcer with diffuse peritonitis, ischemic necrosis of mid ileum. Patient developed septic shock on 4-1, she was started on pressors and wean off pressors on 4-3.  Subsequently she was diagnosed with pelvic abscess by CT scan perform on 4-7. She underwent pelvic drain placement by IR.  C. diff diagnosed 4-12-not responding flagyl, and she was started on vancomycin 4-16.  develops  run of SVT. Cardiology has been helping with management.  Course complicated with high RV pressure ,CT angio negative for PE. - Also with high output fistula formation between anastomosis and drain, making her back to NPO state and in need of TPN. FULL CODE Palliative care consulted and following.  On 5-2 develops hypoxemia, hypotension and SVT during dialysis treatment. Dialysis was stop. She received IV bolus. She was started on BIPAP. She was transfer to Step down unit. CCM and cardiology re consulted.Her Hr decreased, BP improved. She was taken off BIPAP. Now stable, on TNA, off all Abx, continues to have very high output from fistula/leak per CCS Had a 6630f Coax DL G_J tube placed,c tip @ lig Treitz by Dr. [name] of IR and Gen surgery has contuinued to assist with management of her multiple surgical comorbidities  Polyconsultant roles greatly appreciated    HPI/Subjective:  Pain better No n/v Nursing reports tol trickle feed no residuals Still high OP from JP drain Sitting  in chair No cp No n/v    Assessment/Plan: Acute hypoxic Respiratory Failure:  -2nd to H1N1, hypervolemia, HCAP >> resolved.  -also has COPD and PAH -improving, now weaned off O2 -Nebulizer PRN and CPAP QHS   Septic shock  -due to perforated prepyloric ulcer, required pressors -resolved currently  C. difficile colitis still on contact precautions -C diff positive on 4-12, Received 4 days of flagyl without improvement, then started on PO vanc -Completed 3 weeks of Oral Vanc-stopped 5/7, this was 1 week after IV Zosyn was stopped 5/5 -Diarrhea resolved  Status post exploratory laparotomy with closure of perforated pyloric ulcer/abdominal abscess / small bowel resection w/ postop ileus and now with fistula  -Pelvic abscess s/p percutaneous drain placement 4/8; had leakage from anastomosis site ? received Zosyn for 23 days.  -chronic low grade leukocytosis range 13-15 -IV antibiotics discontinued by Surgery on 4/22, also Received 18 days of Vancomycin and fluconazole.  -Due to Fistula is back to NPO state, TPN.  -Fistula study done shows JP drain connection to the duodenal bulb-5-02. - repeat CT abdomen with  3.0 x 3.5 x 5.7 cm collection in the pelvic cul-de-sac and extraluminal air alongside JP drain in anterior abd wall -Per CCS when to repeat CT abd/pelvis--She still has a leukocytosis and is off Abx -Drain with old and new blood, Hb trending down, transfuse 2 unit PRBC 5/10 -continues to have high output from Fistula/leak ?-Had 3630f Coax DL G_J placed, tip lig Treitz placed by IR 08/12/14 at request of gen  surgery -d/w Dr Donell Beers 5/12 whose expertise is greatly appreciated-Will consider ? D/c Octreotide and in the next 7-10 days determine what to do re: J tube feeds with G-tube portion to drain -long slow progress expected-  Hypokalemia;  -replace in TNA Current order as per RD/PharmD onn 5/13  Plan:  - Change to Clinimix E 5/15 with electrolytes at 100/hr and 20% IVFE at  51ml/hr. Lytes taken out on Monday 5/9 due to elevated CaxPhos product. Lytes added back Wednesday 5/11 due to low phos.  - 10 mM naphos bolus, 2 gm mag bolus - Trace elements and MVI daily in TPN.  - Continue sensitive SSI q6h -TPN labs in am  PSVT/concerns for pulmonary embolism given extremely high heart right side pressure -Has been evaluated by EP, no further episodes for 48h -Cardiology following peripherally- follow K+ and Mg  -Continue IV metoprolol, now off amiodarone;  -V-Q scan with intermediate probability for pulmonary embolism.  -CTA 4/27, negative for PE    Small PFO Has been evaluated by Cardiology  ACA CVA with Lt sided weakness -TEE ;normal LV function; no LAA thrombus -Started on ASA  per NEuro, now NPO -Will need ongoing long term rehab - PT/OT to cont to follow while inpatient -Patient remains very weak and deconditioned .   ESRD new start HD this admit coml;icated by intermittent hypotension AKi on CKD 4, due to sepsis, shock Ongoing hemodialysis per Nephrology - perm-cath placed 3/26 d/t AVF problems Patient AV fistula reviewed and not mature yet per Dr. Paulene Floor revisit prior to d/c -she was unable to complete dialysis treatment 5-2 due to hypotension, SVT, stable since.  -has had some ongoing hypotension-EDW may need to be adjusted as per Nephrology  Chronic diastolic congestive heart failure w/ Severe RHF EF 60-65%, grade 1 diastolic dysfunction. Fluids/volume managed with hemodialysis.  Elevated PA pressures on 2-D echo; CTA negative for PE  Anemia of critical illness/chronic disease -and bleeding from leak/fistula -s/p 2U PRBC 4/13  Transfuse 2 u PRBC 5/10 Aranesp/iron per renal -hb stable 8-9 range  Thrombocytopenia; resolved.  Appears to be associated with sepsis Has had mild positive HIT test during this admission  SCDs for DVT proph  Asthma/COPD Continue oxygen supplementation as needed Will continue pulmicort Continue  xopenex CXR 1 view 5/12 non-acute  Diabetes mellitus 2 Hypoglycemia in setting of NPO status. On TPN now, CBGs  stabilizedin 120-160s Hbaic 6.5  Abnormal TSH -Free T4 WNL, T3 1.8 -most likely sick thyroid with ongoing infection and body stress; also due to amiodarone use. TSH in 7 range -continue low dose synthroid given PSVT, now on IV synthroid   L Renal mass  -noted on [place], Indeterminate 1.9 cm hypoechoic mass off the interpolar region of the left kidney -may potentially represent a cyst however solid mass is not excluded -needs  Outpatient FU of this  Obesity - Inadequate oral intake related to altered GI function as evidenced by limited intake, ongoing.  Body mass index is 32.04 kg/(m^2). -patient weight trending down -patient unable to tolerate diet and with leaking fistula  Code Status: FULL Family Communication: discussed with son breifly Disposition Plan: Unclear-needs a lot of stasbilizing--could potenttially consider LTACH ? CIR in next2 weeks   Consultants: Cardiology Nephrology PCCM Gen Surgery  Palliative care  Significant Events: 3/16 influenza A+ 3/18 R ACA stroke 3/19 MRI multifocal acute infarction of both hemispheres, large right distal ACA 3/22 TEE severe RAE, severely reduced RV function, severe TR, no LAA thrombus, small PFO on  TEE 3/31 to OR ex lap for per pyloric ulcer and sm bowel resection 4/1 septic shock on pressors 4/3 pressors weaned off  4/4 restart pressors, CRRT 4/6 levo switched to neo 4/7 pelvic abscess on CT 4/8 IR placed pelvic drain 4/9 off pressors 4/11 long run of SVT. 4/23 VQ scan which demonstrated intermediate probability for PE 4/24 CT angiogram of her chest: Pending/unable to be done due to lack of access   Antibiotics: Zosyn >stopped on 4/22 (received a total of 23 days of treatment ) Vancomycin 4-16  DVT prophylaxis: SCDs  Objective: Blood pressure 100/69, pulse 102, temperature 97.6 F (36.4 C), temperature  source Oral, resp. rate 16, height 5\' 6"  (1.676 m), weight 90 kg (198 lb 6.6 oz), SpO2 98 %.  Intake/Output Summary (Last 24 hours) at 08/14/14 1813 Last data filed at 08/14/14 1700  Gross per 24 hour  Intake 3162.14 ml  Output   1653 ml  Net 1509.14 ml   Exam: General: AAOx3, tired coherent Lungs: ? AE, HD catheter Cardiovascular: Regular rate, no murmurs and no gallops, sinus tachycardia  Abdomen: Abdominal wound dressing noted-clean and healing wound in abd-drains draining well  Data Reviewed: Basic Metabolic Panel:  Recent Labs Lab 08/11/14 0400 08/11/14 0415 08/11/14 1220 08/12/14 0406 08/13/14 0322 08/14/14 0615 08/14/14 1029  NA  --  131* 127* 132* 129* 128* 134*  K  --  3.8 3.9 3.9 4.2 4.8 3.7  CL  --  95* 92* 97* 94* 93* 98*  CO2  --  28 25 27 22 26 26   GLUCOSE  --  166* 733* 145* 129* 649* 125*  BUN  --  54* 11 30* 62* 43* 50*  CREATININE  --  4.40* 1.46* 2.67* 4.25* 3.33* 3.59*  CALCIUM  --  8.0* 7.0* 7.9* 8.3* 8.0* 8.2*  MG 2.0  --   --  1.7 1.7 2.7* 2.5*  PHOS  --  4.6  --  2.2* 4.2 6.5* 4.8*    Liver Function Tests:  Recent Labs Lab 08/08/14 0530 08/10/14 0410 08/11/14 0415 08/12/14 0406 08/13/14 0322 08/14/14 0615  AST 27 25  --   --  23 21  ALT 16 15  --   --  14 10*  ALKPHOS 161* 170*  --   --  170* 157*  BILITOT 1.1 1.0  --   --  0.8 1.2  PROT 6.5 6.6  --   --  6.9 6.2*  ALBUMIN 1.7* 1.7* 1.6* 1.7* 1.7* 1.5*   CBC:  Recent Labs Lab 08/10/14 0410 08/11/14 1220 08/12/14 0406 08/13/14 0322 08/14/14 0615  WBC 16.4* 13.6* 13.2* 15.0* 15.4*  NEUTROABS 11.9*  --   --   --   --   HGB 7.8* 6.8* 9.0* 9.1* 8.3*  HCT 24.1* 22.3* 29.1* 29.3* 27.7*  MCV 88.0 93.3 88.4 88.3 91.1  PLT 357 302 279 288 223    CBG:  Recent Labs Lab 08/13/14 1726 08/13/14 2336 08/14/14 0528 08/14/14 0754 08/14/14 1146  GLUCAP 180* 148* 131* 134* 153*    Scheduled Meds:  Scheduled Meds: . antiseptic oral rinse  7 mL Mouth Rinse q12n4p  . budesonide  (PULMICORT) nebulizer solution  0.25 mg Nebulization BID  . chlorhexidine  15 mL Mouth Rinse BID  . [START ON 08/18/2014] darbepoetin (ARANESP) injection - DIALYSIS  200 mcg Intravenous Q Tue-HD  . ferric gluconate (FERRLECIT/NULECIT) IV  125 mg Intravenous Q T,Th,Sa-HD  . insulin aspart  0-9 Units Subcutaneous 4 times per day  .  levothyroxine  12.5 mcg Intravenous Daily  . metoprolol  2.5 mg Intravenous 3 times per day  . sodium chloride  10-40 mL Intracatheter Q12H    Time spent on care of this patient: 25 minutes   Pleas Koch, MD [facility]   If 7PM-7AM, please contact night-coverage [email or web address] Password [email or web address] 08/14/2014, 6:13 PM   LOS: 58 days

## 2014-08-14 NOTE — Progress Notes (Signed)
Rehab Admissions Coordinator Note:  Patient was screened by Clois DupesBoyette, Camrie Stock Godwin for appropriateness for an Inpatient Acute Rehab Consult per PT recommendation change 5/12. At this time, we are recommending Inpatient Rehab consult may be worth considering. Please place order if you would like us to consult.   Clois DupesBoyette, Breeze Berringer Godwin 08/14/2014, 8:10 AM  I can be reached at [phone].

## 2014-08-14 NOTE — Progress Notes (Signed)
CRITICAL VALUE ALERT  Critical value received:  Glucose 649  Date of notification:  08/14/2014  Time of notification:  0746  Critical value read back:Yes.    Nurse who received alert:  Elita Booneina Ayers  MD notified (1st page):  Samtani   Time of first page:  0810  MD notified (2nd page):  Time of second page:  Responding MD:  No response, false critical lab will redraw  Time MD responded:  NA

## 2014-08-14 NOTE — Consult Note (Signed)
[facility] [place] HISTORY AND PHYSICAL  Requesting: [name] Reason for consult: check left arm fistula History of Present Illness:  Patient is a 59 y.o. year old female who presents for evaluation of her left arm AVF.  This was evaluated in late March and at that time fistulogram showed a proximal aneurysm of the fistula as well as a competing branch.  The plan at that time was to wait to revise the fistula until the patient was healthier and arrange as outpt.  Unfortunately, the patient has had multiple other medical problems and has remained in the hospital.  She had a diatek catheter placed in March to use until the fistula could be revised.  The catheter is currently functioning.  Past Medical History  Diagnosis Date  . Asthma   . Hypertension   . Gout   . Arthritis   . Insomnia   . Chronic kidney disease   . Depression   . Renal insufficiency   . Aortic aneurysm without rupture     3cm by CT 08/03/14  . Paroxysmal SVT (supraventricular tachycardia)   . Cor pulmonale   . Stroke 06/2014  . PFO (patent foramen ovale)   . COPD (chronic obstructive pulmonary disease)     Past Surgical History  Procedure Laterality Date  . Multiple tooth extractions    . Av fistula placement Left 01/28/2014    Procedure: ARTERIOVENOUS (AV) FISTULA CREATION;  Surgeon: Sherren Kernsharles E Fields, MD;  [facility];  Service: Vascular;  Laterality: Left;  . Tee without cardioversion N/A 06/23/2014    Procedure: TRANSESOPHAGEAL ECHOCARDIOGRAM (TEE);  Surgeon: Lewayne BuntingBrian S Crenshaw, MD;  [facility];  Service: Cardiovascular;  Laterality: N/A;  . Insertion of dialysis catheter Right 06/27/2014    Procedure: INSERTION OF Right Internal Jugular DIATEK CATHETER;  Surgeon: Pryor OchoaJames D Lawson, MD;  [facility];  Service: Vascular;  Laterality: Right;  . Laparotomy N/A 07/01/2014    Procedure: EXPLORATORY LAPAROTOMY WITH CLOSURE OF PERFORATED PYLORIC ULCER;  Surgeon: Claud KelpHaywood Ingram, MD;   Location: [facility] OR;  Service: General;  Laterality: N/A;  . Bowel resection N/A 07/01/2014    Procedure: SMALL BOWEL RESECTION;  Surgeon: Claud KelpHaywood Ingram, MD;  [facility];  Service: General;  Laterality: N/A;    Social History History  Substance Use Topics  . Smoking status: Current Every Day Smoker -- 0.50 packs/day for 20 years    Types: Cigarettes  . Smokeless tobacco: Never Used  . Alcohol Use: Yes     Comment: occ    Family History Family History  Problem Relation Age of Onset  . Diabetes Mother   . Hypertension Mother   . Heart disease Mother   . Heart attack Mother   . Peripheral vascular disease Mother   . Diabetes Father   . Heart disease Father   . Hypertension Father   . Diabetes Sister   . Hypertension Sister   . Heart disease Sister     before age 59  . Heart attack Sister   . Peripheral vascular disease Sister   . Heart disease Brother   . Hyperlipidemia Daughter     Allergies  No Known Allergies   Current Facility-Administered Medications  Medication Dose Route Frequency Provider Last Rate Last Dose  . Marland Kitchen.TPN (CLINIMIX-E) Adult   Intravenous Continuous TPN Herby AbrahamMichelle T Bell, RPH 100 mL/hr at 08/13/14 1737    . 0.9 %  sodium chloride infusion   Intravenous Continuous Jeanella CrazeBrandi L Ollis, NP 10 mL/hr  at 08/10/14 2300    . antiseptic oral rinse (CPC / CETYLPYRIDINIUM CHLORIDE 0.05%) solution 7 mL  7 mL Mouth Rinse q12n4p Belkys A Regalado, MD   7 mL at 08/13/14 1610  . bisacodyl (DULCOLAX) suppository 10 mg  10 mg Rectal Daily PRN Megan N Dort, PA-C      . budesonide (PULMICORT) nebulizer solution 0.25 mg  0.25 mg Nebulization BID Vassie Lollarlos Madera, MD   0.25 mg at 08/13/14 2035  . camphor-menthol (SARNA) lotion   Topical PRN Ulice BoldAlicia C Parker, NP      . chlorhexidine (PERIDEX) 0.12 % solution 15 mL  15 mL Mouth Rinse BID Belkys A Regalado, MD   15 mL at 08/13/14 2158  . [START ON 08/18/2014] Darbepoetin Alfa (ARANESP) injection 200 mcg  200 mcg Intravenous Q Tue-HD  Herby AbrahamMichelle T Bell, RPH      . diphenhydrAMINE (BENADRYL) injection 12.5 mg  12.5 mg Intravenous Q6H PRN Vassie Lollarlos Madera, MD   12.5 mg at 08/13/14 2348  . diphenhydrAMINE-zinc acetate (BENADRYL) 2-0.1 % cream   Topical TID PRN Zannie CovePreetha Joseph, MD   1 application at 08/09/14 1434  . feeding supplement (VITAL AF 1.2 CAL) liquid 1,000 mL  1,000 mL Per Tube Continuous Herby AbrahamMichelle T Bell, RPH 10 mL/hr at 08/13/14 1817 1,000 mL at 08/13/14 1817  . ferric gluconate (NULECIT) 125 mg in sodium chloride 0.9 % 100 mL IVPB  125 mg Intravenous Q T,Th,Sa-HD Herby AbrahamMichelle T Bell, RPH   125 mg at 08/13/14 [id number]  . HYDROmorphone (DILAUDID) injection 1-2 mg  1-2 mg Intravenous Q3H PRN Rhetta MuraJai-Gurmukh Samtani, MD   1 mg at 08/14/14 [id number]  . insulin aspart (novoLOG) injection 0-9 Units  0-9 Units Subcutaneous 4 times per day Titus MouldCaron G Amend, RPH   1 Units at 08/14/14 [id number]  . levalbuterol (XOPENEX) nebulizer solution 0.63 mg  0.63 mg Nebulization Q4H PRN Zannie CovePreetha Joseph, MD   0.63 mg at 08/13/14 1639  . levothyroxine (SYNTHROID, LEVOTHROID) injection 12.5 mcg  12.5 mcg Intravenous Daily Belkys A Regalado, MD   12.5 mcg at 08/13/14 1220  . metoprolol (LOPRESSOR) injection 2.5 mg  2.5 mg Intravenous 3 times per day Manson PasseyBhavinkumar Bhagat, PA   2.5 mg at 08/14/14 0615  . octreotide (SANDOSTATIN) 500 mcg in sodium chloride 0.9 % 250 mL (2 mcg/mL) infusion  50 mcg/hr Intravenous Continuous Emina Riebock, NP 25 mL/hr at 08/14/14 0626 50 mcg/hr at 08/14/14 0626  . ondansetron (ZOFRAN) injection 4 mg  4 mg Intravenous Q4H PRN Oley Balmaniel Hassell, MD      . sodium chloride 0.9 % injection 10-40 mL  10-40 mL Intracatheter Q12H Belkys A Regalado, MD   10 mL at 08/13/14 2159  . sodium chloride 0.9 % injection 10-40 mL  10-40 mL Intracatheter PRN Belkys A Regalado, MD   10 mL at 07/30/14 0519    ROS:   General:  No weight loss, Fever, chills  HEENT: No recent headaches, no nasal bleeding, no visual changes, no sore throat  Neurologic: No dizziness, blackouts,  seizures. No recent symptoms of stroke or mini- stroke. No recent episodes of slurred speech, or temporary blindness.  Cardiac: No recent episodes of chest pain/pressure, no shortness of breath at rest.  No shortness of breath with exertion.  Denies history of atrial fibrillation or irregular heartbeat  Vascular: No history of rest pain in feet.  No history of claudication.  No history of non-healing ulcer, No history of DVT   Pulmonary: No home oxygen, no productive cough, no hemoptysis,  No asthma or wheezing  Musculoskeletal:   Arthritis,  Low back pain,   Joint pain  Hematologic:No history of hypercoagulable state.  No history of easy bleeding.  No history of anemia  Gastrointestinal: No hematochezia or melena,  No gastroesophageal reflux, no trouble swallowing  Urinary:  chronic Kidney disease,  on HD -  MWF or  TTHS,  Burning with urination,  Frequent urination,  Difficulty urinating;   Skin: No rashes  Psychological: No history of anxiety,  No history of depression   Physical Examination  Filed Vitals:   08/13/14 1929 08/13/14 2036 08/14/14 0403 08/14/14 0435  BP: 110/60     Pulse: 106     Temp:    97.9 F (36.6 C)  TempSrc:    Oral  Resp: 20     Height:      Weight:   198 lb 6.6 oz (90 kg)   SpO2: 95% 92%      Body mass index is 32.04 kg/(m^2).  General:  Alert and oriented, no acute distress Extremity:  Aneurysmal proximal fistula left antecubital area thrill present but pulsatile.   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  Will consider revision of fistula this hospital admission but only when she is at the point that she is almost ready for d/c in light of her multiple problems she has had during this hospital stay. Please re-consult if it appears d/c from hospital is imminent and we will reevaluate.  I do not want to introduce  a new problem for her until her current ones are better resolved.  Fabienne Bruns, MD [facility] [place] Office: [phone] Pager: [phone]

## 2014-08-14 NOTE — Progress Notes (Signed)
Received critical high glucose. Most likely contaminated. Labs will be redrawn. Blood glucose rechecked by finger stick which read 134. Notified MD

## 2014-08-15 ENCOUNTER — Inpatient Hospital Stay (HOSPITAL_COMMUNITY): Payer: Medicaid Other

## 2014-08-15 LAB — TYPE AND SCREEN
ABO/RH(D): B POS
Antibody Screen: POSITIVE
DAT, IgG: POSITIVE
DONOR AG TYPE: NEGATIVE
DONOR AG TYPE: NEGATIVE
Donor AG Type: NEGATIVE
UNIT DIVISION: 0
UNIT DIVISION: 0
Unit division: 0

## 2014-08-15 LAB — GLUCOSE, CAPILLARY
GLUCOSE-CAPILLARY: 140 mg/dL — AB (ref 65–99)
Glucose-Capillary: 139 mg/dL — ABNORMAL HIGH (ref 65–99)
Glucose-Capillary: 141 mg/dL — ABNORMAL HIGH (ref 65–99)

## 2014-08-15 LAB — BASIC METABOLIC PANEL
Anion gap: 10 (ref 5–15)
BUN: 66 mg/dL — ABNORMAL HIGH (ref 6–20)
CO2: 23 mmol/L (ref 22–32)
Calcium: 7.4 mg/dL — ABNORMAL LOW (ref 8.9–10.3)
Chloride: 98 mmol/L — ABNORMAL LOW (ref 101–111)
Creatinine, Ser: 4.23 mg/dL — ABNORMAL HIGH (ref 0.44–1.00)
GFR calc Af Amer: 12 mL/min — ABNORMAL LOW (ref >60)
GFR calc non Af Amer: 11 mL/min — ABNORMAL LOW (ref >60)
GLUCOSE: 128 mg/dL — AB (ref 65–99)
Potassium: 3 mmol/L — ABNORMAL LOW (ref 3.5–5.1)
Sodium: 131 mmol/L — ABNORMAL LOW (ref 135–145)

## 2014-08-15 LAB — BLOOD GAS, ARTERIAL
Acid-Base Excess: 0.4 mmol/L (ref 0.0–2.0)
Bicarbonate: 25.5 mEq/L — ABNORMAL HIGH (ref 20.0–24.0)
Drawn by: 275531
FIO2: 1 %
O2 Saturation: 93.5 %
Patient temperature: 98.6
TCO2: 27 mmol/L (ref 0–100)
pCO2 arterial: 48.4 mmHg — ABNORMAL HIGH (ref 35.0–45.0)
pH, Arterial: 7.341 — ABNORMAL LOW (ref 7.350–7.450)
pO2, Arterial: 69.7 mmHg — ABNORMAL LOW (ref 80.0–100.0)

## 2014-08-15 LAB — TROPONIN I
TROPONIN I: 0.1 ng/mL — AB (ref <0.031)
Troponin I: 0.14 ng/mL — ABNORMAL HIGH (ref <0.031)

## 2014-08-15 LAB — CBC
HCT: 29.6 % — ABNORMAL LOW (ref 36.0–46.0)
Hemoglobin: 9.2 g/dL — ABNORMAL LOW (ref 12.0–15.0)
MCH: 27.4 pg (ref 26.0–34.0)
MCHC: 31.1 g/dL (ref 30.0–36.0)
MCV: 88.1 fL (ref 78.0–100.0)
Platelets: 276 10*3/uL (ref 150–400)
RBC: 3.36 MIL/uL — ABNORMAL LOW (ref 3.87–5.11)
RDW: 18.9 % — AB (ref 11.5–15.5)
WBC: 14.4 10*3/uL — AB (ref 4.0–10.5)

## 2014-08-15 LAB — MAGNESIUM: Magnesium: 2.3 mg/dL (ref 1.7–2.4)

## 2014-08-15 LAB — PHOSPHORUS: Phosphorus: 3.8 mg/dL (ref 2.5–4.6)

## 2014-08-15 MED ORDER — METOPROLOL TARTRATE 1 MG/ML IV SOLN
INTRAVENOUS | Status: AC
  Administered 2014-08-15: 5 mg via INTRAVENOUS
  Filled 2014-08-15: qty 5

## 2014-08-15 MED ORDER — HYDROMORPHONE HCL 1 MG/ML IJ SOLN
INTRAMUSCULAR | Status: AC
  Administered 2014-08-15: 1 mg
  Filled 2014-08-15: qty 1

## 2014-08-15 MED ORDER — LEVALBUTEROL HCL 0.63 MG/3ML IN NEBU
0.6300 mg | INHALATION_SOLUTION | Freq: Four times a day (QID) | RESPIRATORY_TRACT | Status: DC | PRN
Start: 2014-08-15 — End: 2014-08-27
  Administered 2014-08-16 – 2014-08-22 (×7): 0.63 mg via RESPIRATORY_TRACT
  Filled 2014-08-15 (×10): qty 3

## 2014-08-15 MED ORDER — IOHEXOL 300 MG/ML  SOLN
25.0000 mL | INTRAMUSCULAR | Status: AC
  Administered 2014-08-15 (×2): 25 mL via ORAL

## 2014-08-15 MED ORDER — METOPROLOL TARTRATE 1 MG/ML IV SOLN
5.0000 mg | INTRAVENOUS | Status: DC
  Administered 2014-08-15 – 2014-08-17 (×4): 5 mg via INTRAVENOUS
  Filled 2014-08-15 (×30): qty 5

## 2014-08-15 MED ORDER — TRACE MINERALS CR-CU-F-FE-I-MN-MO-SE-ZN IV SOLN
INTRAVENOUS | Status: AC
  Administered 2014-08-15: 19:00:00 via INTRAVENOUS
  Filled 2014-08-15: qty 2400

## 2014-08-15 MED ORDER — FAT EMULSION 20 % IV EMUL
240.0000 mL | INTRAVENOUS | Status: AC
  Administered 2014-08-15: 240 mL via INTRAVENOUS
  Filled 2014-08-15: qty 250

## 2014-08-15 MED ORDER — METOPROLOL TARTRATE 1 MG/ML IV SOLN
5.0000 mg | Freq: Once | INTRAVENOUS | Status: AC
  Administered 2014-08-15: 5 mg via INTRAVENOUS

## 2014-08-15 MED ORDER — POTASSIUM CHLORIDE 10 MEQ/50ML IV SOLN: 10.0000 meq | INTRAVENOUS | Status: DC

## 2014-08-15 NOTE — Significant Event (Addendum)
  Notified by RN Hr 160 sus ~ 5 min Low sats ~ 80's At bedside patient anxious having CP EKG shows PSVT no axis changes, suggestion of injury CXR benign Troponin to be cycles and pending Metoprolol 5 mg iv given-PSVt 160-->100 and BP better as well ABG non suggestive of retention Transfer back to  SDU Alerted CCM MD ? metoprolol to 5 mg q4 scheduled Will re-assess prior to end of my shift   30 min direct [id number]  Critical care time with high complexity decision making  Pleas KochJai Yorel Redder, MD [facility] [phone]

## 2014-08-15 NOTE — Progress Notes (Signed)
[name] VQQ:[id number] DOB: 20-Dec-1955 DOA: 06/17/2014 PCP: [name], MD  Admit HPI / Brief Narrative: 59 yo ? 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 Page, failed L AV fistula, initially admitted 3/16 with flu and acute hypoxic resp failure. Ultimately tx to [facility] due to need for HD L sided weakness 3/18 and found to have R ACA stroke-showed acute infarct R ACA  Patient ? lethargic 3/30 and CT abd=perf viscus 3/31 was taken to the OR for ex lap for perforated pyloric ulcer with diffuse peritonitis, ischemic necrosis of mid ileum. Patient developed septic shock on 4-1, she was started on pressors and wean off pressors on 4-3.  Subsequently she was diagnosed with pelvic abscess by CT scan perform on 4-7. She underwent pelvic drain placement by IR.  C. diff diagnosed 4-12-not responding flagyl, and she was started on vancomycin 4-16.  develops  run of SVT. Cardiology has been helping with management.  Course complicated with high RV pressure ,CT angio negative for PE. - Also with high output fistula formation between anastomosis and drain, making her back to NPO state and in need of TPN. FULL CODE Palliative care consulted and following.  On 5-2 develops hypoxemia, hypotension and SVT during dialysis treatment. Dialysis was stop. She received IV bolus. She was started on BIPAP. She was transfer to Step down unit. CCM and cardiology re consulted.Her Hr decreased, BP improved. She was taken off BIPAP. Now stable, on TNA, off all Abx, continues to have very high output from fistula/leak per [facility] Had a 14f Coax DL G_J tube placed,c tip @ lig Treitz by Dr. [name] of IR and Gen surgery has contuinued to assist with management of her multiple surgical comorbidities CT repeat 5/14 showed 5 cm abscess-Gen surg and IR to discuss plan of care going forward  Polyconsultant roles greatly appreciated    HPI/Subjective:  Seen at dialysis Pain usual 7/10 Looks  better No cp n/v/ Note drainage from J +G drains as per surgeon input    Assessment/Plan: Acute hypoxic Respiratory Failure:  -2nd to H1N1, hypervolemia, HCAP >> resolved.  -also has COPD and PAH -improving, now weaned off O2 -Nebulizer PRN and CPAP QHS   Septic shock  -due to perforated prepyloric ulcer, required pressors -resolved currently  C. difficile colitis still on contact precautions -C diff positive on 4-12, Received 4 days of flagyl without improvement, then started on PO vanc -Completed 3 weeks of Oral Vanc-stopped 5/7, this was 1 week after IV Zosyn was stopped 5/5 -Diarrhea resolved  Status post exploratory laparotomy with closure of perforated pyloric ulcer/abdominal abscess / small bowel resection w/ postop ileus and now with fistula  -Pelvic abscess s/p percutaneous drain placement 4/8; had leakage from anastomosis site ? received Zosyn for 23 days.  -chronic low grade leukocytosis range 13-15 -IV antibiotics discontinued by Surgery on 4/22, also Received 18 days of Vancomycin and fluconazole.  -Due to Fistula is back to NPO state, TPN.  -Fistula study done shows JP drain connection to the duodenal bulb-5-02. - repeat CT abdomen with  3.0 x 3.5 x 5.7 cm collection in the pelvic cul-de-sac and extraluminal air alongside JP drain in anterior abd wall -Drain with old and new blood, Hb trending down, transfuse 2 unit PRBC 5/10 -continues to have high output from Fistula/leak ?-Had 54f Coax DL G_J placed, tip lig Treitz placed by IR 08/12/14 at request of gen surgery -d/w Dr Donell Beers 5/12 whose  expertise is greatly appreciated-Will consider ? D/c Octreotide and in the next 7-10 days determine what to do re: J tube feeds with G-tube portion to drain -CT repeat shows 5 cm post cul-de-sac abscess which Gen surg and IR will discuss in terms of intervention  Hypokalemia;  -replace in TNA Current order as per RD/PharmD onn 5/14\  - Change to Clinimix E 5/15 (with  electrolytes) and increase back to goal rate of 100 ml/hr + IVFE at 10 ml/hr (since trickle feeds discontinued) - Will monitor evaluation of GJ leak and plans to restart TFs - Will add MV + TE back to TPN - Continue sensitive SSI - Will f/u TPN labs, BMET/Mg/Phos in AM  PSVT/concerns for pulmonary embolism given extremely high heart right side pressure -Has been evaluated by EP, no further episodes for 48h -Cardiology following peripherally- follow K+ and Mg  -Continue IV metoprolol, now off amiodarone;  -V-Q scan with intermediate probability for pulmonary embolism.  -CTA 4/27, negative for PE    Small PFO Has been evaluated by Cardiology  ACA CVA with Lt sided weakness -TEE ;normal LV function; no LAA thrombus -Started on ASA  per NEuro, now NPO -Will need ongoing long term rehab - PT/OT to cont to follow while inpatient -Patient remains very weak and deconditioned .   ESRD new start HD this admit coml;icated by intermittent hypotension AKi on CKD 4, due to sepsis, shock Ongoing hemodialysis per Nephrology - perm-cath placed 3/26 d/t AVF problems Patient AV fistula reviewed and not mature yet per Dr. Paulene Floor revisit prior to d/c -she was unable to complete dialysis treatment 5-2 due to hypotension, SVT, stable since.  -has had some ongoing hypotension-EDW may need to be adjusted as per Nephrology  Chronic diastolic congestive heart failure w/ Severe RHF EF 60-65%, grade 1 diastolic dysfunction. Fluids/volume managed with hemodialysis.  Elevated PA pressures on 2-D echo; CTA negative for PE  Anemia of critical illness/chronic disease -and bleeding from leak/fistula -s/p 2U PRBC 4/13  Transfuse 2 u PRBC 5/10 Aranesp/iron per renal -hb stable 8-9 range  Thrombocytopenia; resolved.  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 On TPN now, CBGs  stabilizedin 120-160s Hbaic 6.5  Abnormal TSH -Free T4 WNL, T3 1.8 -most likely sick thyroid with ongoing infection and body stress; also due to amiodarone use. TSH in 7 range -continue low dose synthroid given PSVT, now on IV synthroid   L Renal mass  -noted on [place], Indeterminate 1.9 cm hypoechoic mass off the interpolar region of the left kidney -may potentially represent a cyst however solid mass is not excluded -needs  Outpatient FU of this  Obesity - Inadequate oral intake related to altered GI function as evidenced by limited intake, ongoing.  Body mass index is 32.86 kg/(m^2). -patient weight trending down -patient unable to tolerate diet and with leaking fistula  Code Status: FULL Family Communication: discussed with son breifly Disposition Plan: per gen surgery re: multiple abcesses-medical problems although severe are stable  Consultants: Cardiology Nephrology PCCM Gen Surgery  Palliative care  Significant Events: 3/16 influenza A+ 3/18 R ACA stroke 3/19 MRI multifocal acute infarction of both hemispheres, large right distal ACA 3/22 TEE severe RAE, severely reduced RV function, severe TR, no LAA thrombus, small PFO on  TEE 3/31 to OR ex lap for per pyloric ulcer and sm bowel resection 4/1 septic shock on pressors 4/3 pressors weaned off  4/4 restart pressors, CRRT 4/6 levo switched to neo 4/7 pelvic abscess on CT 4/8 IR placed pelvic drain 4/9 off pressors 4/11 long run of SVT. 4/23 VQ scan which demonstrated intermediate probability for PE 4/24 CT angiogram of her chest: Pending/unable to be done due to lack of access   Antibiotics: Zosyn >stopped on 4/22 (received a total of 23 days of treatment ) Vancomycin 4-16  DVT prophylaxis: SCDs  Objective: Blood pressure 113/75, pulse 108, temperature 97.5 F (36.4 C),  temperature source Axillary, resp. rate 16, height 5\' 6"  (1.676 m), weight 92.3 kg (203 lb 7.8 oz), SpO2 100 %.  Intake/Output Summary (Last 24 hours) at 08/15/14 1553 Last data filed at 08/15/14 1224  Gross per 24 hour  Intake 3071.33 ml  Output   1975 ml  Net 1096.33 ml   Exam: General: AAOx3, tired coherent Lungs: ? AE, HD catheter Cardiovascular: Regular rate, no murmurs and no gallops, sinus tachycardia  Abdomen: Abdominal wound dressing noted-wounds not examined today  Data Reviewed: Basic Metabolic Panel:  Recent Labs Lab 08/12/14 0406 08/13/14 0322 08/14/14 0615 08/14/14 1029 08/15/14 0430  NA 132* 129* 128* 134* 131*  K 3.9 4.2 4.8 3.7 3.0*  CL 97* 94* 93* 98* 98*  CO2 27 22 26 26 23   GLUCOSE 145* 129* 649* 125* 128*  BUN 30* 62* 43* 50* 66*  CREATININE 2.67* 4.25* 3.33* 3.59* 4.23*  CALCIUM 7.9* 8.3* 8.0* 8.2* 7.4*  MG 1.7 1.7 2.7* 2.5* 2.3  PHOS 2.2* 4.2 6.5* 4.8* 3.8    Liver Function Tests:  Recent Labs Lab 08/10/14 0410 08/11/14 0415 08/12/14 0406 08/13/14 0322 08/14/14 0615  AST 25  --   --  23 21  ALT 15  --   --  14 10*  ALKPHOS 170*  --   --  170* 157*  BILITOT 1.0  --   --  0.8 1.2  PROT 6.6  --   --  6.9 6.2*  ALBUMIN 1.7* 1.6* 1.7* 1.7* 1.5*   CBC:  Recent Labs Lab 08/10/14 0410 08/11/14 1220 08/12/14 0406 08/13/14 0322 08/14/14 0615 08/15/14 1045  WBC 16.4* 13.6* 13.2* 15.0* 15.4* 14.4*  NEUTROABS 11.9*  --   --   --   --   --   HGB 7.8* 6.8* 9.0* 9.1* 8.3* 9.2*  HCT 24.1* 22.3* 29.1* 29.3* 27.7* 29.6*  MCV 88.0 93.3 88.4 88.3 91.1 88.1  PLT 357 302 279 288 223 276    CBG:  Recent Labs Lab 08/14/14 0754 08/14/14 1146 08/14/14 2347 08/15/14 0608 08/15/14 1218  GLUCAP 134* 153* 141* 140* 139*    Scheduled Meds:  Scheduled Meds: . antiseptic oral rinse  7 mL Mouth Rinse q12n4p  . budesonide (PULMICORT) nebulizer solution  0.25 mg Nebulization BID  . chlorhexidine  15 mL Mouth Rinse BID  . [START ON 08/18/2014]  darbepoetin (ARANESP) injection - DIALYSIS  200 mcg Intravenous Q Tue-HD  . ferric gluconate (FERRLECIT/NULECIT) IV  125 mg Intravenous Q T,Th,Sa-HD  . insulin aspart  0-9 Units Subcutaneous 4 times per day  . levothyroxine  12.5 mcg Intravenous Daily  . metoprolol  2.5 mg Intravenous 3 times per day  . sodium chloride  10-40 mL Intracatheter Q12H    Time spent on care of this patient: 15 minutes   Pleas KochJai Judah Carchi, MD [facility] [phone](P) [phone]  If 7PM-7AM, please contact night-coverage [email or web address] Password [email or web address] 08/15/2014, 3:53 PM   LOS: 59 days

## 2014-08-15 NOTE — Progress Notes (Signed)
Hemodialysis: Patient became tachycardic with HR running between 160's and 170"s on the cardiac monitor. Dr. Lacy DuverneyG[place] and rapid response were notified. EKG, Chest x-ray, and metoprolol were ordered, and executed. Patient was Initially put on Oxygen nasal canula and then on non-rebreather saturating at 100%/ Patient in currently stable and will continue to monitor the patient.

## 2014-08-15 NOTE — Significant Event (Signed)
Rapid Response Event Note  Overview: Called to see patient for rapid heart rate and tachypnea at 1650.    Initial Focused Assessment: Upon arrival patient is alert, diaphoretic, tachypneic and tachycardic.  MD at bedside.  Interventions: Assisted with therapies: EKG, medication administration (metoprolol), ABG, chest xray, patient care.   Event Summary: Heart Rate decreased to 110's after metoprolol administration. Assisted with weaning of O2 from NRB mask to Nasal Cannula. Patient more comfortable at 1730. To return to step down unit. Will assist as needed.   Name of Consulting Physician Notified: PCCM  called by attending Dr. Carmell Austriaegaldo at    Outcome: Transferred (Comment) (SDU 3S)     Kristine LineaLackey, Caeley Dohrmann Ann

## 2014-08-15 NOTE — Progress Notes (Signed)
Report called to Erin RN

## 2014-08-15 NOTE — Progress Notes (Signed)
RT Note:  Attempted to placed patient on CPAP, via FFM, auto settings.  Patient unable to tolerated.  Adjusted settings for patient comfort, patient still unable to tolerate at this time.  Patient states " she has too many things going on at this time."

## 2014-08-15 NOTE — Progress Notes (Addendum)
PARENTERAL NUTRITION CONSULT NOTE - FOLLOW UP  Pharmacy Consult for TPN Indication:  High output Duodenal Fistula  No Known Allergies  Patient Measurements: Height: _0  (167.6 cm) Weight: 203 lb 7.8 oz (92.3 kg) IBW/kg (Calculated) : 59.3 Adjusted Body Weight: 71.2kg  Vital Signs: Temp: 97.5 F (36.4 C) (05/14 0400) Temp Source: Oral (05/14 0400) BP: 96/60 mmHg (05/14 0400) Pulse Rate: 106 (05/14 0400) Intake/Output from previous day: 05/13 0701 - 05/14 0700 In: 2256.7 [I.V.:440.8; NG/GT:198.7; TPN:1617.3] Out: 1675 [Drains:1675]  Labs:  Recent Labs  08/12/14 1101 08/13/14 0322 08/14/14 0615  WBC  --  15.0* 15.4*  HGB  --  9.1* 8.3*  HCT  --  29.3* 27.7*  PLT  --  288 223  APTT 36  --   --   INR 1.19  --  1.28    Recent Labs  08/13/14 0322 08/14/14 0615 08/14/14 1029 08/15/14 0430  NA 129* 128* 134* 131*  K 4.2 4.8 3.7 3.0*  CL 94* 93* 98* 98*  CO2 _1 GLUCOSE 129* 649* 125* 128*  BUN 62* 43* 50* 66*  CREATININE 4.25* 3.33* 3.59* 4.23*  CALCIUM 8.3* 8.0* 8.2* 7.4*  MG 1.7 2.7* 2.5* 2.3  PHOS 4.2 6.5* 4.8* 3.8  PROT 6.9 6.2*  --   --   ALBUMIN 1.7* 1.5*  --   --   AST 23 21  --   --   ALT 14 10*  --   --   ALKPHOS 170* 157*  --   --   BILITOT 0.8 1.2  --   --    Estimated Creatinine Clearance: 16.6 mL/min (by C-G formula based on Cr of 4.23).   Recent Labs  08/14/14 1146 08/14/14 2347 08/15/14 0608  GLUCAP 153* 141* 140*   Insulin Requirements in the past 12 hours:  2 unit Novolog SSI, no insulin in TPN  Current Nutrition:  Vital AF 1.2 at 10 ml/hr via J tube provides ~18 gm protein and 288 kcals Clinimix 5/15 (no lytes) at 1169m/hr and 20% IVFE at 175mhr which will provide 2184 kcal and 120 gm protein.  Nutritional Goals: (per RD note 5/12) 2100-2300 kCal, 120-140 grams of protein per day Goal: Clinimix 5/15 at 10061mr + 20% IVFE 69m16m to provide 2184 kcal and 120 gm protein  Assessment: 58 Y79 with complicated  hospital course. S/p closure perforated pyloric ulcer, SBR for ischemic necrosis, diffuse peritonitis on 07/01/14. Pharmacy consulted to provide TPN 4/1-4/15 for nutrition support for prolonged ileus. Pt then transitioned to po diet + supplements. 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  GI: Duodenal fistula. NPO. Open abd wound, (+)abd pain. Octreotide drip ordered 5/10 am. Albumin 1.7. Prealbumin remains stable and low at 9.4 Noted palliative care discussions ongoing but for now pt/family wish for aggressive care. Trickle feeds started 5/12, +flatus, no BM - on 5/14 AM TFs were noted to be leaking out of blake drain and G-tube. Currently being held while evaluating for leak.     5/10 Add octreotide drip 5/11 IR placed gastro-jenjunal tube for post-pyloric feeding and drainage of gastric juices 5/12 to start Vital AF 1.2 trickle feedings via J tube at 10 ml/hr 5/13 Continues on trickle feeds at 10 ml/hr 5/14: Possible leak in GJ tube - getting imaging to evaluate. TFs held  Endo: Hx DM/hypothyroidism. Had low CBGs prior to TPN start, current CBGs/24h: 140-153. No insulin in TPN, on sensitive SSI  LytesEvaristo [name] being added and removed to the TPN bag as needed based on labs. Na 131, K 3, Phos 3.8, Mg 2.3, CoCa~9.4.  Per discussion with renal [name]) - wants pharmacy to manage K - however [name] wants to hold K replacement on 5/14 and instead use a 4K HD bath. Will plan to add lytes back to TPN bag - the patient may end up having to alternate between lytes and no lytes.   Renal: New ESRD - started 3/17. HD days changed to TTS. S/p extra session on 5/10. Last HD on 5/12 (4 hr BFR 300), planning again on 5/14.   Heme: Anemia of ESRD. Hgb 8.3, on Aranesp 200 mcg-Tues  Pulm: Hx asthma/COPD. RA. CT negative for PE. CCM signed off 5/3  Cards: HTN. (afib).  Midodrine and amio on hold (no NGT and NPO) IV metoprolol. CHADS-VASc =4 (not good candidate for [facility]).  Cards rec restart IV amio if SVT worsens  Hepatotobil: Alk phos elevated 157 << 170, LFTs/Tbili okay, TG 141 (5/9)  Neuro: Hx depression / insomnia - new stroke noted on 3/18.   ID: completed PO Vanc D#14/14on 5/7  for cdiff colitis, Diarrhea is better. WBC 15  Best Practices: SCDs, PPI IV  TPN Access: PICC line in IR 4/27 TPN start date: 4/1->4/15; restart 4/27>>  Plan:  - Change to Clinimix E 5/15 (with electrolytes) and increase back to goal rate of 100 ml/hr  + IVFE at 10 ml/hr (since trickle feeds discontinued) - Will monitor evaluation of GJ leak and plans to restart TFs - Will add MV + TE back to TPN - Continue sensitive SSI - Will f/u TPN labs, BMET/Mg/Phos in AM  [name], PharmD, BCPS Clinical Pharmacist Pager: [phone] 08/15/2014 7:25 AM

## 2014-08-15 NOTE — Progress Notes (Signed)
S: CO pruritis O:BP 96/60 mmHg  Pulse 106  Temp(Src) 97.5 F (36.4 C) (Oral)  Resp 16  Ht 5\' 6"  (1.676 m)  Wt 92.3 kg (203 lb 7.8 oz)  BMI 32.86 kg/m2  SpO2 95%  Intake/Output Summary (Last 24 hours) at 08/15/14 [id number] Last data filed at 08/15/14 0401  Gross per 24 hour  Intake 2256.74 ml  Output   1675 ml  Net 581.74 ml   Weight change: 6.1 kg (13 lb 7.2 oz) VWU:[id number]:awake and alert CVS: RRR Resp: clear Abd: RUQ drain.  G tube Ext: no edema.  LUA AVF + bruit No Rash NEURO: Decreased strength in Lt upper and lower ext. Ox3 No asterixis Rt IJ permcath   . antiseptic oral rinse  7 mL Mouth Rinse q12n4p  . budesonide (PULMICORT) nebulizer solution  0.25 mg Nebulization BID  . chlorhexidine  15 mL Mouth Rinse BID  . [START ON 08/18/2014] darbepoetin (ARANESP) injection - DIALYSIS  200 mcg Intravenous Q Tue-HD  . ferric gluconate (FERRLECIT/NULECIT) IV  125 mg Intravenous Q T,Th,Sa-HD  . insulin aspart  0-9 Units Subcutaneous 4 times per day  . levothyroxine  12.5 mcg Intravenous Daily  . metoprolol  2.5 mg Intravenous 3 times per day  . sodium chloride  10-40 mL Intracatheter Q12H   Dg Chest 1 View  08/13/2014   CLINICAL DATA:  Shortness of breath  EXAM: CHEST  1 VIEW  COMPARISON:  08/03/2014  FINDINGS: Moderately severe cardiac enlargement. Central line in unchanged position on the right and left side. Mild vascular congestion with no evidence of edema effusion or consolidation.  IMPRESSION: No acute findings   Electronically Signed   By: Esperanza Heiraymond  Rubner M.D.   On: 08/13/2014 11:04   BMET    Component Value Date/Time   NA 131* 08/15/2014 0430   NA 139 10/02/2012 1046   K 3.0* 08/15/2014 0430   K 4.4 10/02/2012 1046   CL 98* 08/15/2014 0430   CO2 23 08/15/2014 0430   CO2 25 10/02/2012 1046   GLUCOSE 128* 08/15/2014 0430   GLUCOSE 110 10/02/2012 1046   BUN 66* 08/15/2014 0430   BUN 35.2* 10/02/2012 1046   CREATININE 4.23* 08/15/2014 0430   CREATININE 2.5* 10/02/2012 1046    CALCIUM 7.4* 08/15/2014 0430   CALCIUM 9.8 10/02/2012 1046   GFRNONAA 11* 08/15/2014 0430   GFRAA 12* 08/15/2014 0430   CBC    Component Value Date/Time   WBC 15.4* 08/14/2014 0615   WBC 6.8 10/02/2012 1046   RBC 3.04* 08/14/2014 0615   RBC 4.87 10/02/2012 1046   HGB 8.3* 08/14/2014 0615   HGB 13.1 10/02/2012 1046   HCT 27.7* 08/14/2014 0615   HCT 40.3 10/02/2012 1046   PLT 223 08/14/2014 0615   PLT 359 10/02/2012 1046   MCV 91.1 08/14/2014 0615   MCV 82.6 10/02/2012 1046   MCH 27.3 08/14/2014 0615   MCH 26.8 10/02/2012 1046   MCHC 30.0 08/14/2014 0615   MCHC 32.5 10/02/2012 1046   RDW 19.4* 08/14/2014 0615   RDW 17.4* 10/02/2012 1046   LYMPHSABS 2.0 08/10/2014 0410   LYMPHSABS 1.9 10/02/2012 1046   MONOABS 1.8* 08/10/2014 0410   MONOABS 0.8 10/02/2012 1046   EOSABS 0.5 08/10/2014 0410   EOSABS 0.2 10/02/2012 1046   BASOSABS 0.2* 08/10/2014 0410   BASOSABS 0.1 10/02/2012 1046     Assessment: 1. CVA with Lt hemiparesis 2. Perf pyloric ulcer SP Exp lap.  Now with doudenal fistula 3. C diff 4.  Mild hyponatremia 5. Anemia on aranesp 6 Sec HPTH last pth was march. Not on Vit D 7. HypoPO4, resolved 8. Paroxysmal SVT 9. 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  Plan HD today with 4K bath and change to TTS schedule for now    Salam Chesterfield T

## 2014-08-15 NOTE — Progress Notes (Signed)
[facility] Progress Note  45 Days Post-Op  Subjective: Pt feels good this am.  No abdominal complaints.  Dressing changes are showing purulent drainage.  TF's appear to be leaking and coming out of blake drain and out g-tube.  IS up to 650.  No flatus or BM per patient.  Pending CT scan.  Objective: Vital signs in last 24 hours: Temp:  [97.5 F (36.4 C)-98.5 F (36.9 C)] 97.5 F (36.4 C) (05/14 0400) Pulse Rate:  [79-106] 106 (05/14 0400) Resp:  [15-22] 16 (05/14 0400) BP: (90-112)/(48-73) 96/60 mmHg (05/14 0400) SpO2:  [79 %-100 %] 94 % (05/14 0812) Weight:  [92.3 kg (203 lb 7.8 oz)] 92.3 kg (203 lb 7.8 oz) (05/14 0400) Last BM Date: 07/31/14  Intake/Output from previous day: 05/13 0701 - 05/14 0700 In: 2256.7 [I.V.:440.8; NG/GT:198.7; TPN:1617.3] Out: 1675 [Drains:1675] Intake/Output this shift: Total I/O In: -  Out: 550 [Drains:550]  PE: Gen:  Alert, NAD, pleasant Card:  Tachy, but less than yesterday, no M/G/R heard Pulm:  CTA, no W/R/R, IS up to 650.   Abd: Soft, mildly tender, ND, +BS, no HSM, midline wound with yellow slough at base and some purulent looking drainage at inferior aspect, some wound dehiscence at inferior aspect, sutures exposed, some granulation tissue formation at edges, Blake drain in LLQ with 106775mL/24hr bilious and now tan in color (TF's leaking?), tan liquid coming out of g-tube drainage as well (64200mL/24hr)   Lab Results:   Recent Labs  08/13/14 0322 08/14/14 0615  WBC 15.0* 15.4*  HGB 9.1* 8.3*  HCT 29.3* 27.7*  PLT 288 223   BMET  Recent Labs  08/14/14 1029 08/15/14 0430  NA 134* 131*  K 3.7 3.0*  CL 98* 98*  CO2 26 23  GLUCOSE 125* 128*  BUN 50* 66*  CREATININE 3.59* 4.23*  CALCIUM 8.2* 7.4*   PT/INR  Recent Labs  08/12/14 1101 08/14/14 0615  LABPROT 15.2 16.2*  INR 1.19 1.28   CMP     Component Value Date/Time   NA 131* 08/15/2014 0430   NA 139 10/02/2012 1046   K 3.0* 08/15/2014 0430   K 4.4  10/02/2012 1046   CL 98* 08/15/2014 0430   CO2 23 08/15/2014 0430   CO2 25 10/02/2012 1046   GLUCOSE 128* 08/15/2014 0430   GLUCOSE 110 10/02/2012 1046   BUN 66* 08/15/2014 0430   BUN 35.2* 10/02/2012 1046   CREATININE 4.23* 08/15/2014 0430   CREATININE 2.5* 10/02/2012 1046   CALCIUM 7.4* 08/15/2014 0430   CALCIUM 9.8 10/02/2012 1046   PROT 6.2* 08/14/2014 0615   PROT 8.4* 10/02/2012 1046   ALBUMIN 1.5* 08/14/2014 0615   ALBUMIN 3.1* 10/02/2012 1046   AST 21 08/14/2014 0615   AST 9 10/02/2012 1046   ALT 10* 08/14/2014 0615   ALT <6 Repeated and Verified 10/02/2012 1046   ALKPHOS 157* 08/14/2014 0615   ALKPHOS 108 10/02/2012 1046   BILITOT 1.2 08/14/2014 0615   BILITOT 0.21 10/02/2012 1046   GFRNONAA 11* 08/15/2014 0430   GFRAA 12* 08/15/2014 0430   Lipase  No results found for: LIPASE     Studies/Results: Dg Chest 1 View  08/13/2014   CLINICAL DATA:  Shortness of breath  EXAM: CHEST  1 VIEW  COMPARISON:  08/03/2014  FINDINGS: Moderately severe cardiac enlargement. Central line in unchanged position on the right and left side. Mild vascular congestion with no evidence of edema effusion or consolidation.  IMPRESSION: No acute findings   Electronically  Signed   By: Esperanza Heiraymond  Rubner M.D.   On: 08/13/2014 11:04    Anti-infectives: Anti-infectives    Start     Dose/Rate Route Frequency Ordered Stop   08/12/14 1449  ceFAZolin (ANCEF) 2-3 GM-% IVPB SOLR    Comments:  Troy SineLaffan, Eileen   : cabinet override      08/12/14 1449 08/12/14 1629   08/12/14 1130  ceFAZolin (ANCEF) IVPB 2 g/50 mL premix    Comments:  Hang ON CALL to xray 5/11   2 g 100 mL/hr over 30 Minutes Intravenous To Radiology 08/12/14 1035 08/12/14 1520   08/04/14 1200  vancomycin (VANCOCIN) 50 mg/mL oral solution 125 mg  Status:  Discontinued     125 mg Oral 4 times per day 08/04/14 0851 08/08/14 1229   07/18/14 1800  vancomycin (VANCOCIN) 50 mg/mL oral solution 125 mg  Status:  Discontinued     125 mg Oral 4  times per day 07/18/14 1455 08/04/14 0845   07/17/14 1830  vancomycin (VANCOCIN) IVPB 750 mg/150 ml premix     750 mg 150 mL/hr over 60 Minutes Intravenous  Once 07/17/14 1818 07/18/14 0024   07/15/14 1400  metroNIDAZOLE (FLAGYL) tablet 500 mg  Status:  Discontinued     500 mg Oral 3 times per day 07/15/14 1202 07/18/14 1533   07/13/14 2200  piperacillin-tazobactam (ZOSYN) IVPB 2.25 g  Status:  Discontinued     2.25 g 100 mL/hr over 30 Minutes Intravenous 3 times per day 07/13/14 1318 07/24/14 1138   07/13/14 2000  fluconazole (DIFLUCAN) IVPB 200 mg  Status:  Discontinued     200 mg 100 mL/hr over 60 Minutes Intravenous Every 24 hours 07/13/14 1318 07/19/14 1027   07/07/14 1200  vancomycin (VANCOCIN) IVPB 1000 mg/200 mL premix  Status:  Discontinued     1,000 mg 200 mL/hr over 60 Minutes Intravenous Every 24 hours 07/07/14 0937 07/14/14 1712   07/06/14 2000  fluconazole (DIFLUCAN) IVPB 400 mg  Status:  Discontinued     400 mg 100 mL/hr over 120 Minutes Intravenous Every 24 hours 07/06/14 1507 07/13/14 1318   07/06/14 1800  piperacillin-tazobactam (ZOSYN) IVPB 2.25 g  Status:  Discontinued     2.25 g 100 mL/hr over 30 Minutes Intravenous 4 times per day 07/06/14 1505 07/13/14 1318   07/05/14 2000  fluconazole (DIFLUCAN) IVPB 200 mg  Status:  Discontinued     200 mg 100 mL/hr over 60 Minutes Intravenous Every 24 hours 07/05/14 0757 07/06/14 1507   07/05/14 1400  piperacillin-tazobactam (ZOSYN) IVPB 2.25 g  Status:  Discontinued     2.25 g 100 mL/hr over 30 Minutes Intravenous 3 times per day 07/05/14 0749 07/06/14 1505   07/02/14 1800  vancomycin (VANCOCIN) IVPB 1000 mg/200 mL premix  Status:  Discontinued     1,000 mg 200 mL/hr over 60 Minutes Intravenous Every 24 hours 07/01/14 1858 07/05/14 0801   07/01/14 2200  piperacillin-tazobactam (ZOSYN) IVPB 3.375 g  Status:  Discontinued     3.375 g 100 mL/hr over 30 Minutes Intravenous 4 times per day 07/01/14 1858 07/05/14 0749   07/01/14  2000  fluconazole (DIFLUCAN) IVPB 400 mg  Status:  Discontinued     400 mg 100 mL/hr over 120 Minutes Intravenous Every 24 hours 07/01/14 1858 07/05/14 0757   07/01/14 1400  fluconazole (DIFLUCAN) IVPB 200 mg  Status:  Discontinued     200 mg 100 mL/hr over 60 Minutes Intravenous Every 24 hours 07/01/14 1330 07/01/14 1853  07/01/14 1000  piperacillin-tazobactam (ZOSYN) IVPB 2.25 g  Status:  Discontinued     2.25 g 100 mL/hr over 30 Minutes Intravenous Every 8 hours 07/01/14 0952 07/01/14 1853   07/01/14 1000  vancomycin (VANCOCIN) 500 mg in sodium chloride 0.9 % 100 mL IVPB     500 mg 100 mL/hr over 60 Minutes Intravenous  Once 07/01/14 0952 07/01/14 1126   06/30/14 1200  vancomycin (VANCOCIN) IVPB 1000 mg/200 mL premix     1,000 mg 200 mL/hr over 60 Minutes Intravenous  Once 06/30/14 0944 06/30/14 1403   06/30/14 1200  ceFEPIme (MAXIPIME) 2 g in dextrose 5 % 50 mL IVPB     2 g 100 mL/hr over 30 Minutes Intravenous  Once 06/30/14 0944 06/30/14 1425   06/30/14 0100  vancomycin (VANCOCIN) 2,000 mg in sodium chloride 0.9 % 500 mL IVPB     2,000 mg 250 mL/hr over 120 Minutes Intravenous  Once 06/30/14 0048 06/30/14 0525   06/30/14 0100  ceFEPIme (MAXIPIME) 2 g in dextrose 5 % 50 mL IVPB     2 g 100 mL/hr over 30 Minutes Intravenous  Once 06/30/14 0048 06/30/14 0322   06/27/14 0600  cefUROXime (ZINACEF) 1.5 g in dextrose 5 % 50 mL IVPB     1.5 g 100 mL/hr over 30 Minutes Intravenous On call to O.R. 06/26/14 1019 06/27/14 0630   06/19/14 1800  oseltamivir (TAMIFLU) capsule 30 mg     30 mg Oral Every M-W-F (1800) 06/19/14 1003 06/22/14 1841   06/18/14 1600  oseltamivir (TAMIFLU) capsule 30 mg  Status:  Discontinued     30 mg Oral Daily 06/18/14 1538 06/19/14 1003       Assessment/Plan POD #46 s/p Exploratory Laparotomy with graham patch closure of perforated pyloric ulcer, SBR for ischemic bowel - 07/01/2014 - Derrell Lolling  Intra-abdominal fluid collection, no drain   Now with duodenal  fistula -CT 08/03/14 showed 3.5x5.7x3.0cm pre-rectal fluid collection. No drain was replaced in this pelvic fluid collection after previous drain accidentally pulled out  -Fistula study done and shows JP drain near graham patch with connection to the duodenal bulb.  -Strict NPO, TPN. Sandostatin helping? -Wound vac discontinued, now on NS WD dressing changes TID due to significant drainage, order repeat wound culture, question whether we need to resume antibiotics -I&Os and routine drain care  -IR completed GJ tube on 08/12/14. Hold trickle TF for now until we get CT scan back, concerned the jejunal TF are re-gurging up into the stomach since we are now seeing tan in the blake drainage. Monitor drain output. Dietitian following.   Inability to take PO/PCM - continue TF through J tube port VTE prophylaxis-SCDs  ID--Zosyn stopped on day #24 (07/24/14). WBC 15.4. Ordered repeat CT scan for today with oral contrast through g-tube and j-tube site C diff colitis New ESRD  PCM/TNA - at some point can wean TNA if she tolerates TF    LOS: 59 days    DORT, Sayra Frisby 08/15/2014, 8:47 AM Pager: [phone]

## 2014-08-16 ENCOUNTER — Encounter (HOSPITAL_COMMUNITY): Payer: Self-pay | Admitting: Radiology

## 2014-08-16 LAB — CBC
HCT: 28.2 % — ABNORMAL LOW (ref 36.0–46.0)
Hemoglobin: 8.6 g/dL — ABNORMAL LOW (ref 12.0–15.0)
MCH: 27.6 pg (ref 26.0–34.0)
MCHC: 30.5 g/dL (ref 30.0–36.0)
MCV: 90.4 fL (ref 78.0–100.0)
Platelets: 250 10*3/uL (ref 150–400)
RBC: 3.12 MIL/uL — ABNORMAL LOW (ref 3.87–5.11)
RDW: 19.4 % — ABNORMAL HIGH (ref 11.5–15.5)
WBC: 16 10*3/uL — AB (ref 4.0–10.5)

## 2014-08-16 LAB — BASIC METABOLIC PANEL
Anion gap: 9 (ref 5–15)
BUN: 45 mg/dL — ABNORMAL HIGH (ref 6–20)
CO2: 28 mmol/L (ref 22–32)
Calcium: 7.6 mg/dL — ABNORMAL LOW (ref 8.9–10.3)
Chloride: 95 mmol/L — ABNORMAL LOW (ref 101–111)
Creatinine, Ser: 3.59 mg/dL — ABNORMAL HIGH (ref 0.44–1.00)
GFR calc Af Amer: 15 mL/min — ABNORMAL LOW (ref >60)
GFR calc non Af Amer: 13 mL/min — ABNORMAL LOW (ref >60)
Glucose, Bld: 127 mg/dL — ABNORMAL HIGH (ref 65–99)
Potassium: 4.4 mmol/L (ref 3.5–5.1)
Sodium: 132 mmol/L — ABNORMAL LOW (ref 135–145)

## 2014-08-16 LAB — GLUCOSE, CAPILLARY
GLUCOSE-CAPILLARY: 120 mg/dL — AB (ref 65–99)
Glucose-Capillary: 153 mg/dL — ABNORMAL HIGH (ref 65–99)
Glucose-Capillary: 96 mg/dL (ref 65–99)
Glucose-Capillary: 176 mg/dL — ABNORMAL HIGH (ref 65–99)
Glucose-Capillary: 155 mg/dL — ABNORMAL HIGH (ref 65–99)

## 2014-08-16 LAB — PHOSPHORUS: Phosphorus: 4 mg/dL (ref 2.5–4.6)

## 2014-08-16 LAB — TROPONIN I: TROPONIN I: 0.1 ng/mL — AB (ref <0.031)

## 2014-08-16 LAB — MAGNESIUM: Magnesium: 2.2 mg/dL (ref 1.7–2.4)

## 2014-08-16 MED ORDER — FAT EMULSION 20 % IV EMUL
240.0000 mL | INTRAVENOUS | Status: AC
  Administered 2014-08-16: 240 mL via INTRAVENOUS
  Filled 2014-08-16: qty 250

## 2014-08-16 MED ORDER — TRACE MINERALS CR-CU-F-FE-I-MN-MO-SE-ZN IV SOLN
INTRAVENOUS | Status: AC
  Administered 2014-08-16: 18:00:00 via INTRAVENOUS
  Filled 2014-08-16: qty 2400

## 2014-08-16 NOTE — Progress Notes (Addendum)
[name] EAV:[id number] DOB: 24-Sep-1955 DOA: 06/17/2014 PCP: [name], MD  Admit HPI / Brief Narrative: 59 yo ? 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 Page, failed L AV fistula, initially admitted 3/16 with flu and acute hypoxic resp failure. Ultimately tx to [facility] due to need for HD L sided weakness 3/18 and found to have R ACA stroke-showed acute infarct R ACA  Patient ? lethargic 3/30 and CT abd=perf viscus 3/31 was taken to the OR for ex lap for perforated pyloric ulcer with diffuse peritonitis, ischemic necrosis of mid ileum. Patient developed septic shock on 4-1, she was started on pressors and wean off pressors on 4-3.  Subsequently she was diagnosed with pelvic abscess by CT scan perform on 4-7. She underwent pelvic drain placement by IR.  C. diff diagnosed 4-12-not responding flagyl, and she was started on vancomycin 4-16.  develops  run of SVT. Cardiology has been helping with management.  Course complicated with high RV pressure ,CT angio negative for PE. - Also with high output fistula formation between anastomosis and drain, making her back to NPO state and in need of TPN. FULL CODE Palliative care consulted On 5-2 = hypoxemia, hypotension and SVT during dialysis treatment. Dialysis was stop. She received IV bolus. She was started on BIPAP. She was transfer to Step down unit. CCM and cardiology re consulted.Her Hr decreased, BP improved. She was taken off BIPAP. This recurred on 5/15 with episodic SVT Now stable, on TNA, off all Abx, continues to have very high output from fistula/leak per [facility] Had a 22f Coax DL G_J tube placed,c tip @ lig Treitz by Dr. [name] of IR and Gen surgery has contuinued to assist with management of her multiple surgical comorbidities CT repeat 5/14 showed 5 cm abscess-Gen surg and IR to discuss plan of care going forward in terms of further drainage  Polyconsultant roles greatly appreciated     HPI/Subjective:  sitting in the chair Looks the best i have seen her Smiling and joking No cp Abd pain 8/10   Assessment/Plan: Acute hypoxic Respiratory Failure:  -2nd to H1N1, hypervolemia, HCAP >> resolved.  -also has COPD and PAH -improving, now weaning off O2 -Nebulizer PRN and CPAP QHS   Septic shock  -due to perforated prepyloric ulcer, required pressors -resolved currently  C. difficile colitis still on contact precautions -C diff positive on 4-12, Received 4 days of flagyl without improvement, then started on PO vanc -Completed 3 weeks of Oral Vanc-stopped 5/7, this was 1 week after IV Zosyn was stopped 5/5 -Diarrhea resolved  Status post exploratory laparotomy with closure of perforated pyloric ulcer/abdominal abscess / small bowel resection w/ postop ileus and now with fistula  -Pelvic abscess s/p percutaneous drain placement 4/8; had leakage from anastomosis site ? received Zosyn for 23 days.  -chronic low grade leukocytosis range 13-15 -IV antibiotics discontinued by Surgery on 4/22, also Received 18 days of Vancomycin and fluconazole.  -Due to Fistula is back to NPO state, TPN.  -Fistula study done shows JP drain connection to the duodenal bulb-5-02. - repeat CT abdomen with  3.0 x 3.5 x 5.7 cm collection in the pelvic cul-de-sac and extraluminal air alongside JP drain in anterior abd wall -Drain with old and new blood, Hb trending down, transfuse 2 unit PRBC 5/10 -continues to have high output from Fistula/leak ?-Had 61f Coax DL G_J placed, tip lig Treitz placed by IR 08/12/14 at request of gen  surgery -d/w Dr [name] 5/12 whose expertise is greatly appreciated-Will consider ? D/c Octreotide and in the next 7-10 days determine what to do re: J tube feeds with G-tube portion to drain -CT repeat shows 5 cm post cul-de-sac abscess which Gen surg and IR will discuss in terms of intervention  Hypokalemia;  -replace in TNA Current order as per RD/PharmD onn  5/15  Plan:  - Continue Clinimix E 5/15 (with electrolytes) at goal rate of 100 ml/hr + IVFE at 10 ml/hr - Will add MV + TE back to TPN - Continue sensitive SSI - Will f/u plans to resume TFs - Will f/u TPN labs in the AM  PSVT/concerns for pulmonary embolism given extremely high heart right side pressure Multiple episoded 5/2 and 5/15 -Has been evaluated by EP, no further episodes for 48h -Cardiology following peripherally- follow K+ and Mg  -Continue IV metoprolol, now off amiodarone;  -V-Q scan with intermediate probability for pulmonary embolism.  -CTA 4/27, negative for PE   -she is hypotensive 2/2 to metoprolol IV  Small PFO Has been evaluated by Cardiology  ACA CVA with Lt sided weakness -TEE ;normal LV function; no LAA thrombus -Started on ASA 81mg  per NEuro, now NPO -Will need ongoing long term rehab - PT/OT to cont to follow while inpatient -Patient remains very weak and deconditioned .   ESRD new start HD this admit coml;icated by intermittent hypotension AKi on CKD 4, due to sepsis, shock Ongoing hemodialysis per Nephrology - perm-cath placed 3/26 d/t AVF problems Patient AV fistula reviewed and not mature yet per Dr. Paulene FloorFields-will revisit prior to d/c -she was unable to complete dialysis treatment 5-2 due to hypotension, SVT, -has had some ongoing hypotension-EDW may need to be adjusted as per Nephrology  Chronic diastolic congestive heart failure w/ Severe RHF EF 60-65%, grade 1 diastolic dysfunction. Fluids/volume managed with hemodialysis.  Elevated PA pressures on 2-D echo; CTA negative for PE  Anemia of critical illness/chronic disease -and bleeding from leak/fistula -s/p 2U PRBC 4/13  Transfuse 2 u PRBC 5/10 Aranesp/iron per renal -hb stable 8-9 range  Thrombocytopenia; resolved.  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 On TPN now, CBGs  stabilizedin 120-160s Hbaic 6.5  Abnormal TSH -Free T4 WNL, T3 1.8 -most likely sick thyroid with ongoing infection and body stress; also due to amiodarone use. TSH in 7 range -continue low dose synthroid given PSVT, now on IV synthroid   L Renal mass  -noted on US, Indeterminate 1.9 cm hypoechoic mass off the interpolar region of the left kidney -may potentially represent a cyst however solid mass is not excluded -needs  Outpatient FU of this  Obesity - Inadequate oral intake related to altered GI function as evidenced by limited intake, ongoing.  Body mass index is 34.18 kg/(m^2). -patient weight trending down -patient unable to tolerate diet and with leaking fistula  Code Status: FULL Family Communication: discussed with son breifly Disposition Plan: unclear-maybe LTACH if can mainatin stability  Consultants: Cardiology Nephrology PCCM Gen Surgery  Palliative care  Significant Events: 3/16 influenza A+ 3/18 R ACA stroke 3/19 MRI multifocal acute infarction of both hemispheres, large right distal ACA 3/22 TEE severe RAE, severely reduced RV function, severe TR, no LAA thrombus, small PFO on TEE  3/31 to OR ex lap for per pyloric ulcer and sm bowel resection 4/1 septic shock on pressors 4/3 pressors weaned off  4/4 restart pressors, CRRT 4/6 levo switched to neo 4/7 pelvic abscess on CT 4/8 IR placed pelvic drain 4/9 off pressors 4/11 long run of SVT. 4/23 VQ scan which demonstrated intermediate probability for PE 4/24 CT angiogram of her chest: Pending/unable to be done due to lack of access   Antibiotics: Zosyn >stopped on 4/22 (received a total of 23 days of treatment ) Vancomycin 4-16  DVT prophylaxis: SCDs  Objective: Blood pressure 102/55, pulse 95, temperature 98.7 F (37.1 C), temperature source  Oral, resp. rate 16, height  (1.676 m), weight 96 kg (211 lb 10.3 oz), SpO2 96 %.  Intake/Output Summary (Last 24 hours) at 08/16/14 1602 Last data filed at 08/16/14 1400  Gross per 24 hour  Intake   2430 ml  Output    995 ml  Net   1435 ml   Exam: General: AAOx3, tired coherent Lungs: ? AE, HD catheter Cardiovascular: Regular rate, no murmurs and no gallops, sinus tachycardia  Abdomen: Abdominal wound dressing noted-wounds not examined today  Data Reviewed: Basic Metabolic Panel:  Recent Labs Lab 08/13/14 0322 08/14/14 0615 08/14/14 1029 08/15/14 0430 08/16/14 0641  NA 129* 128* 134* 131* 132*  K 4.2 4.8 3.7 3.0* 4.4  CL 94* 93* 98* 98* 95*  CO2 GLUCOSE 129* 649* 125* 128* 127*  BUN 62* 43* 50* 66* 45*  CREATININE 4.25* 3.33* 3.59* 4.23* 3.59*  CALCIUM 8.3* 8.0* 8.2* 7.4* 7.6*  MG 1.7 2.7* 2.5* 2.3 2.2  PHOS 4.2 6.5* 4.8* 3.8 4.0    Liver Function Tests:  Recent Labs Lab 08/10/14 0410 08/11/14 0415 08/12/14 0406 08/13/14 0322 08/14/14 0615  AST 25  --   --  23 21  ALT 15  --   --  14 10*  ALKPHOS 170*  --   --  170* 157*  BILITOT 1.0  --   --  0.8 1.2  PROT 6.6  --   --  6.9 6.2*  ALBUMIN 1.7* 1.6* 1.7* 1.7* 1.5*   CBC:  Recent Labs Lab 08/10/14 0410  08/12/14 0406 08/13/14 0322 08/14/14 0615 08/15/14 1045 08/16/14 0641  WBC 16.4*  < > 13.2* 15.0* 15.4* 14.4* 16.0*  NEUTROABS 11.9*  --   --   --   --   --   --   HGB 7.8*  < > 9.0* 9.1* 8.3* 9.2* 8.6*  HCT 24.1*  < > 29.1* 29.3* 27.7* 29.6* 28.2*  MCV 88.0  < > 88.4 88.3 91.1 88.1 90.4  PLT 357  < > 279 288 223 276 250  < > = values in this interval not displayed.  CBG:  Recent Labs Lab 08/15/14 1218 08/16/14 0045 08/16/14 0633 08/16/14 0832 08/16/14 1202  GLUCAP 139* 153* 96 176* 155*    Scheduled Meds:  Scheduled Meds: . antiseptic oral rinse  7 mL Mouth Rinse q12n4p  . budesonide (PULMICORT) nebulizer solution  0.25 mg Nebulization BID  . chlorhexidine  15  mL Mouth Rinse BID  . [START ON 08/18/2014] darbepoetin (ARANESP) injection - DIALYSIS  200 mcg Intravenous Q Tue-HD  . ferric gluconate (FERRLECIT/NULECIT) IV  125 mg Intravenous Q T,Th,Sa-HD  . insulin aspart  0-9 Units Subcutaneous 4 times per day  . levothyroxine  12.5 mcg Intravenous Daily  . metoprolol  5 mg Intravenous Q4H  . sodium chloride  10-40 mL Intracatheter Q12H    Time spent on care of this patient: 15 minutes   Pleas KochJai Neesa Knapik, MD [facility](P) [phone]   If 7PM-7AM, please contact night-coverage [email or web address] Password [email or web address] 08/16/2014, 4:02 PM   LOS: 60 days

## 2014-08-16 NOTE — Progress Notes (Signed)
PARENTERAL NUTRITION CONSULT NOTE - FOLLOW UP  Pharmacy Consult for TPN Indication:  High output Duodenal Fistula  No Known Allergies  Patient Measurements: Height: 5' 6" (167.6 cm) Weight: 211 lb 10.3 oz (96 kg) IBW/kg (Calculated) : 59.3 Adjusted Body Weight: 71.2kg  Vital Signs: Temp: 98 F (36.7 C) (05/15 0457) Temp Source: Axillary (05/15 0457) BP: 92/58 mmHg (05/15 0635) Pulse Rate: 45 (05/15 0635) Intake/Output from previous day: 05/14 0701 - 05/15 0700 In: 2905 [I.V.:325; NG/GT:35; TPN:1445] Out: 1650 [Drains:1240]  Labs:  Recent Labs  08/14/14 0615 08/15/14 1045  WBC 15.4* 14.4*  HGB 8.3* 9.2*  HCT 27.7* 29.6*  PLT 223 276  INR 1.28  --     Recent Labs  08/14/14 0615 08/14/14 1029 08/15/14 0430  NA 128* 134* 131*  K 4.8 3.7 3.0*  CL 93* 98* 98*  CO2 _0 GLUCOSE 649* 125* 128*  BUN 43* 50* 66*  CREATININE 3.33* 3.59* 4.23*  CALCIUM 8.0* 8.2* 7.4*  MG 2.7* 2.5* 2.3  PHOS 6.5* 4.8* 3.8  PROT 6.2*  --   --   ALBUMIN 1.5*  --   --   AST 21  --   --   ALT 10*  --   --   ALKPHOS 157*  --   --   BILITOT 1.2  --   --    Estimated Creatinine Clearance: 16.9 mL/min (by C-G formula based on Cr of 4.23).   Recent Labs  08/15/14 0608 08/15/14 1218 08/16/14 0045  GLUCAP 140* 139* 153*   Insulin Requirements in the past 12 hours:  2 unit Novolog SSI, no insulin in TPN  Current Nutrition:  Vital AF 1.2 at 10 ml/hr via J tube provides ~18 gm protein and 288 kcals Clinimix E 5/15 (with electrolytes) at 161m/hr and 20% IVFE at 17mhr which will provide 2184 kcal and 120 gm protein.  Nutritional Goals: (per RD note 5/12) 2100-2300 kCal, 120-140 grams of protein per day Goal: Clinimix 5/15 at 10011mr + 20% IVFE 54m26m to provide 2184 kcal and 120 gm protein  Assessment: 58 Y42 with complicated hospital course. S/p closure perforated pyloric ulcer, SBR for ischemic necrosis, diffuse peritonitis on 07/01/14. Pharmacy consulted to provide  TPN 4/1-4/15 for nutrition support for prolonged ileus. Pt then transitioned to po diet + supplements. 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  GI: Duodenal fistula. NPO. Open abd wound, (+)abd pain. Octreotide drip ordered 5/10 am. Albumin 1.7. Prealbumin remains stable and low at 9.4 Noted palliative care discussions ongoing but for now pt/family wish for aggressive care. Trickle feeds started 5/12, +flatus, no BM - on 5/14 AM TFs were noted to be leaking out of blake drain and G-tube. TFs held however imaging showed normal placement - TFs remain held per MD on 5/15. Abdominal drain output/24h: 940 cc (slightly reduced from 5/13), GJ output/24h: ~300 cc    5/10 Add octreotide drip 5/11 IR placed gastro-jenjunal tube for post-pyloric feeding and drainage of gastric juices 5/12 to start Vital AF 1.2 trickle feedings via J tube at 10 ml/hr 5/13 Continues on trickle feeds at 10 ml/hr 5/14: Possible leak in GJ tube - TFs held. Imaging showed no change in JP position with tips in proper positions however pt still with pre-rectal fluid collection 5/15: TFs still being held. IR to replace drain for fluid collection  Endo: Hx DM/hypothyroidism. Had low CBGs prior to TPN start, current CBGs/24h: 128-153. No insulin  in TPN, on sensitive SSI, synthroid (new)  Lytes: Lytes being added and removed to the TPN bag as needed based on labs. Na 132, K 4.4 << 3 (combination of adding back lytes and 4k bath with HD), Phos 4, Mg 2.2, CoCa~8.6.  Per discussion with renal ([place]) - wants pharmacy to manage K however replacement held on 5/14 in place of a 4K HD bath at HD. K/Phos are noted to have trended up however are still within range and Na/Ca are noted to be slightly low - will continue with lytes in the TPN bag today - may need to hold with 5/16 bag to keep in balance.  Renal: New ESRD - started 3/17. HD days changed to TTS. S/p extra session on 5/10. Last HD on 5/14 (3.5 hr  BFR 250) - poor tolerance d/t CP and tachycardia, rapid response called. Per renal to continue on TTS for now, next session planned for 5/17   Heme: Anemia of ESRD. Hgb 8.6, plts wnl, on Aranesp 200 mcg-Tues. On Nulecit/HD  Pulm: Hx asthma/COPD. RA. CT negative for PE. CCM signed off 5/3  Cards: HTN. (afib).  Midodrine and amio on hold (no NGT and NPO) IV metoprolol. CHADS-VASc =4 (not good candidate for AC). Cards rec restart IV amio if SVT worsens  Hepatotobil: Alk phos elevated 157 << 170, LFTs/Tbili okay, TG 141 (5/9)  Neuro: Hx depression / insomnia - new stroke noted on 3/18.   ID: Tmax/24h: 99.8, WBC 16, s/p treatment for CDiff  Best Practices: SCDs, PPI IV  TPN Access: PICC line in IR 4/27  TPN start date: 4/1->4/15; restart 4/27>>  Plan:  - Continue Clinimix E 5/15 (with electrolytes) at goal rate of 100 ml/hr  + IVFE at 10 ml/hr - Will add MV + TE back to TPN - Continue sensitive SSI - Will f/u plans to resume TFs - Will f/u TPN labs in the AM   , PharmD, BCPS Clinical Pharmacist Pager: [phone] 08/16/2014 7:05 AM    

## 2014-08-16 NOTE — Progress Notes (Signed)
Subjective: Pt known to our service, complex surgical history with multiple previous abdominal abscess drainages. Recent H_J tube placement. Repeat Abd CT shows persistent residual pelvic fluid collection/abscess. Elevated WBC, IR requested to aspirate/drain residual abscess  Objective: Physical Exam: BP 82/61 mmHg  Pulse 100  Temp(Src) 98.4 F (36.9 C) (Oral)  Resp 20  Ht 5\' 6"  (1.676 m)  Wt 211 lb 10.3 oz (96 kg)  BMI 34.18 kg/m2  SpO2 96% AA female, NAD ENT: unremarkable Heart: mildly tachy Lungs: CTA Abd: G-J tube intact, site clean    Labs: CBC  Recent Labs  08/15/14 1045 08/16/14 0641  WBC 14.4* 16.0*  HGB 9.2* 8.6*  HCT 29.6* 28.2*  PLT 276 250   BMET  Recent Labs  08/15/14 0430 08/16/14 0641  NA 131* 132*  K 3.0* 4.4  CL 98* 95*  CO2 23 28  GLUCOSE 128* 127*  BUN 66* 45*  CREATININE 4.23* 3.59*  CALCIUM 7.4* 7.6*   LFT  Recent Labs  08/14/14 0615  PROT 6.2*  ALBUMIN 1.5*  AST 21  ALT 10*  ALKPHOS 157*  BILITOT 1.2   PT/INR  Recent Labs  08/14/14 0615  LABPROT 16.2*  INR 1.28     Studies/Results: Ct Abdomen Pelvis Wo Contrast  08/15/2014   CLINICAL DATA:  Intra abdominal abscess.  EXAM: CT ABDOMEN AND PELVIS WITHOUT CONTRAST  TECHNIQUE: Multidetector CT imaging of the abdomen and pelvis was performed following the standard protocol without IV contrast.  COMPARISON:  CT scan of Aug 03, 2014.  FINDINGS: Severe degenerative disc disease is noted at L5-S1. Minimal subsegmental atelectasis is noted posteriorly in the left lung base.  No gallstones are noted. No focal abnormality is noted in the liver, spleen or pancreas on these unenhanced images. Adrenal glands appear normal. Stable exophytic cyst is seen arising from midpole of left kidney. No hydronephrosis or renal obstruction is noted. No renal or ureteral calculi are noted. 3 cm infrarenal abdominal aortic aneurysm is noted. Gastrojejunostomy tube is seen entering stomach  percutaneously and with distal tip in the jejunum. JP surgical drain is seen entering right upper quadrant of abdomen underneath the liver with distal tip in the left upper quadrant. No significant fluid collection is noted around the drain. Fluid collection is again identified and posterior cul-de-sac of pelvis measuring 5.1 x 3.2 cm. Calcified degenerating fibroid is noted in the uterus. Urinary bladder is unremarkable. There is no evidence of bowel obstruction. The appendix appears normal. No significant adenopathy is noted. Large midline surgical anterior abdominal wall incision is noted.  IMPRESSION: Stable 3 cm infrarenal abdominal aortic aneurysm.  No change in position of JP surgical drain with distal tip in the left upper quadrant of the abdomen.  Interval placement of percutaneous gastrojejunostomy tube with distal tip in the jejunum.  Grossly stable size and appearance of 5 cm fluid collection seen in posterior cul-de-sac of the pelvis.  Continued presence uterine fibroids.   Electronically Signed   By: Lupita RaiderJames  Green Jr, M.D.   On: 08/15/2014 15:15   Dg Chest Port 1 View  08/15/2014   CLINICAL DATA:  Shortness of breath.  EXAM: PORTABLE CHEST - 1 VIEW  COMPARISON:  08/13/2014 and multiple prior chest radiographs  FINDINGS: Cardiomegaly and pulmonary vascular congestion again noted.  A right IJ central venous catheter is noted with tips overlying the upper and mid SVC.  A left central venous catheter is present with tip overlying the lower SVC.  There is no evidence of  focal airspace disease, pulmonary edema, suspicious pulmonary nodule/mass, pleural effusion, or pneumothorax. No acute bony abnormalities are identified.  IMPRESSION: Cardiomegaly with pulmonary vascular congestion.   Electronically Signed   By: Harmon PierJeffrey  Hu M.D.   On: 08/15/2014 17:28    Assessment/Plan: Persistent residual pelvic abscess For CT guided aspiration/drainage tomorrow. Labs reviewed. Consent signed in chart after review  of procedure, risks, complications.     LOS: 60 days    Brayton ElBRUNING, Gage Weant PA-C 08/16/2014 9:07 AM

## 2014-08-16 NOTE — Progress Notes (Signed)
S: Pruritis better O:BP 102/61 mmHg  Pulse 85  Temp(Src) 98 F (36.7 C) (Axillary)  Resp 22  Ht 5\' 6"  (1.676 m)  Wt 96 kg (211 lb 10.3 oz)  BMI 34.18 kg/m2  SpO2 98%  Intake/Output Summary (Last 24 hours) at 08/16/14 0801 Last data filed at 08/16/14 0700  Gross per 24 hour  Intake   3352 ml  Output   1740 ml  Net   1612 ml   Weight change: 0.4 kg (14.1 oz) GEX:BMWUXGen:awake and alert CVS: RRR Resp: clear Abd: RUQ drain.  G tube Ext: no edema.  LUA AVF + bruit No Rash NEURO: Decreased strength in Lt upper and lower ext. Ox3 No asterixis Rt IJ permcath   . antiseptic oral rinse  7 mL Mouth Rinse q12n4p  . budesonide (PULMICORT) nebulizer solution  0.25 mg Nebulization BID  . chlorhexidine  15 mL Mouth Rinse BID  . [START ON 08/18/2014] darbepoetin (ARANESP) injection - DIALYSIS  200 mcg Intravenous Q Tue-HD  . ferric gluconate (FERRLECIT/NULECIT) IV  125 mg Intravenous Q T,Th,Sa-HD  . insulin aspart  0-9 Units Subcutaneous 4 times per day  . levothyroxine  12.5 mcg Intravenous Daily  . metoprolol  5 mg Intravenous Q4H  . sodium chloride  10-40 mL Intracatheter Q12H   Ct Abdomen Pelvis Wo Contrast  08/15/2014   CLINICAL DATA:  Intra abdominal abscess.  EXAM: CT ABDOMEN AND PELVIS WITHOUT CONTRAST  TECHNIQUE: Multidetector CT imaging of the abdomen and pelvis was performed following the standard protocol without IV contrast.  COMPARISON:  CT scan of Aug 03, 2014.  FINDINGS: Severe degenerative disc disease is noted at L5-S1. Minimal subsegmental atelectasis is noted posteriorly in the left lung base.  No gallstones are noted. No focal abnormality is noted in the liver, spleen or pancreas on these unenhanced images. Adrenal glands appear normal. Stable exophytic cyst is seen arising from midpole of left kidney. No hydronephrosis or renal obstruction is noted. No renal or ureteral calculi are noted. 3 cm infrarenal abdominal aortic aneurysm is noted. Gastrojejunostomy tube is seen entering  stomach percutaneously and with distal tip in the jejunum. JP surgical drain is seen entering right upper quadrant of abdomen underneath the liver with distal tip in the left upper quadrant. No significant fluid collection is noted around the drain. Fluid collection is again identified and posterior cul-de-sac of pelvis measuring 5.1 x 3.2 cm. Calcified degenerating fibroid is noted in the uterus. Urinary bladder is unremarkable. There is no evidence of bowel obstruction. The appendix appears normal. No significant adenopathy is noted. Large midline surgical anterior abdominal wall incision is noted.  IMPRESSION: Stable 3 cm infrarenal abdominal aortic aneurysm.  No change in position of JP surgical drain with distal tip in the left upper quadrant of the abdomen.  Interval placement of percutaneous gastrojejunostomy tube with distal tip in the jejunum.  Grossly stable size and appearance of 5 cm fluid collection seen in posterior cul-de-sac of the pelvis.  Continued presence uterine fibroids.   Electronically Signed   By: Lupita RaiderJames  Green Jr, M.D.   On: 08/15/2014 15:15   Dg Chest Port 1 View  08/15/2014   CLINICAL DATA:  Shortness of breath.  EXAM: PORTABLE CHEST - 1 VIEW  COMPARISON:  08/13/2014 and multiple prior chest radiographs  FINDINGS: Cardiomegaly and pulmonary vascular congestion again noted.  A right IJ central venous catheter is noted with tips overlying the upper and mid SVC.  A left central venous catheter is present with  tip overlying the lower SVC.  There is no evidence of focal airspace disease, pulmonary edema, suspicious pulmonary nodule/mass, pleural effusion, or pneumothorax. No acute bony abnormalities are identified.  IMPRESSION: Cardiomegaly with pulmonary vascular congestion.   Electronically Signed   By: Harmon PierJeffrey  Hu M.D.   On: 08/15/2014 17:28   BMET    Component Value Date/Time   NA 132* 08/16/2014 0641   NA 139 10/02/2012 1046   K 4.4 08/16/2014 0641   K 4.4 10/02/2012 1046   CL  95* 08/16/2014 0641   CO2 28 08/16/2014 0641   CO2 25 10/02/2012 1046   GLUCOSE 127* 08/16/2014 0641   GLUCOSE 110 10/02/2012 1046   BUN 45* 08/16/2014 0641   BUN 35.2* 10/02/2012 1046   CREATININE 3.59* 08/16/2014 0641   CREATININE 2.5* 10/02/2012 1046   CALCIUM 7.6* 08/16/2014 0641   CALCIUM 9.8 10/02/2012 1046   GFRNONAA 13* 08/16/2014 0641   GFRAA 15* 08/16/2014 0641   CBC    Component Value Date/Time   WBC 16.0* 08/16/2014 0641   WBC 6.8 10/02/2012 1046   RBC 3.12* 08/16/2014 0641   RBC 4.87 10/02/2012 1046   HGB 8.6* 08/16/2014 0641   HGB 13.1 10/02/2012 1046   HCT 28.2* 08/16/2014 0641   HCT 40.3 10/02/2012 1046   PLT 250 08/16/2014 0641   PLT 359 10/02/2012 1046   MCV 90.4 08/16/2014 0641   MCV 82.6 10/02/2012 1046   MCH 27.6 08/16/2014 0641   MCH 26.8 10/02/2012 1046   MCHC 30.5 08/16/2014 0641   MCHC 32.5 10/02/2012 1046   RDW 19.4* 08/16/2014 0641   RDW 17.4* 10/02/2012 1046   LYMPHSABS 2.0 08/10/2014 0410   LYMPHSABS 1.9 10/02/2012 1046   MONOABS 1.8* 08/10/2014 0410   MONOABS 0.8 10/02/2012 1046   EOSABS 0.5 08/10/2014 0410   EOSABS 0.2 10/02/2012 1046   BASOSABS 0.2* 08/10/2014 0410   BASOSABS 0.1 10/02/2012 1046     Assessment: 1. CVA with Lt hemiparesis 2. Perf pyloric ulcer SP Exp lap.  Now with doudenal fistula 3. C diff 4. Mild hyponatremia 5. Anemia on aranesp 6 Sec HPTH last pth was march. Not on Vit D 7. HypoPO4, resolved 8. Paroxysmal SVT 9. ESRD  Needs revision of AVF when healthier.  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  Plan HD again on tuesday    Chantele Corado T

## 2014-08-16 NOTE — Progress Notes (Signed)
Patient refused CPAP. Patient aware to have RN call Respiratory if she changes her mind.  RN aware.

## 2014-08-16 NOTE — Progress Notes (Signed)
46 Days Post-Op  Subjective: Got very tachy in HD yesterday. tx to SDU. Pt not too talkative this am. Had 2 bm  Objective: Vital signs in last 24 hours: Temp:  [96.7 F (35.9 C)-99.8 F (37.7 C)] 98 F (36.7 C) (05/15 0457) Pulse Rate:  [32-165] 85 (05/15 0750) Resp:  [15-22] 22 (05/15 0750) BP: (79-160)/(45-129) 102/61 mmHg (05/15 0750) SpO2:  [94 %-100 %] 98 % (05/15 0750) Weight:  [92.2 kg (203 lb 4.2 oz)-96 kg (211 lb 10.3 oz)] 96 kg (211 lb 10.3 oz) (05/15 0457) Last BM Date: 08/15/14  Intake/Output from previous day: 05/14 0701 - 05/15 0700 In: 3480 [I.V.:450; NG/GT:35; TPN:1895] Out: 1740 [Drains:1330] Intake/Output this shift:    Awake, nontoxic cta ant  Reg Soft, nd, open midline wound - no change from yesterday; JP - empty but bile tinged  Lab Results:   Recent Labs  08/15/14 1045 08/16/14 0641  WBC 14.4* 16.0*  HGB 9.2* 8.6*  HCT 29.6* 28.2*  PLT 276 250   BMET  Recent Labs  08/15/14 0430 08/16/14 0641  NA 131* 132*  K 3.0* 4.4  CL 98* 95*  CO2 23 28  GLUCOSE 128* 127*  BUN 66* 45*  CREATININE 4.23* 3.59*  CALCIUM 7.4* 7.6*   PT/INR  Recent Labs  08/14/14 0615  LABPROT 16.2*  INR 1.28   ABG  Recent Labs  08/15/14 1703  PHART 7.341*  HCO3 25.5*    Studies/Results: Ct Abdomen Pelvis Wo Contrast  08/15/2014   CLINICAL DATA:  Intra abdominal abscess.  EXAM: CT ABDOMEN AND PELVIS WITHOUT CONTRAST  TECHNIQUE: Multidetector CT imaging of the abdomen and pelvis was performed following the standard protocol without IV contrast.  COMPARISON:  CT scan of Aug 03, 2014.  FINDINGS: Severe degenerative disc disease is noted at L5-S1. Minimal subsegmental atelectasis is noted posteriorly in the left lung base.  No gallstones are noted. No focal abnormality is noted in the liver, spleen or pancreas on these unenhanced images. Adrenal glands appear normal. Stable exophytic cyst is seen arising from midpole of left kidney. No hydronephrosis or renal  obstruction is noted. No renal or ureteral calculi are noted. 3 cm infrarenal abdominal aortic aneurysm is noted. Gastrojejunostomy tube is seen entering stomach percutaneously and with distal tip in the jejunum. JP surgical drain is seen entering right upper quadrant of abdomen underneath the liver with distal tip in the left upper quadrant. No significant fluid collection is noted around the drain. Fluid collection is again identified and posterior cul-de-sac of pelvis measuring 5.1 x 3.2 cm. Calcified degenerating fibroid is noted in the uterus. Urinary bladder is unremarkable. There is no evidence of bowel obstruction. The appendix appears normal. No significant adenopathy is noted. Large midline surgical anterior abdominal wall incision is noted.  IMPRESSION: Stable 3 cm infrarenal abdominal aortic aneurysm.  No change in position of JP surgical drain with distal tip in the left upper quadrant of the abdomen.  Interval placement of percutaneous gastrojejunostomy tube with distal tip in the jejunum.  Grossly stable size and appearance of 5 cm fluid collection seen in posterior cul-de-sac of the pelvis.  Continued presence uterine fibroids.   Electronically Signed   By: Lupita RaiderJames  Green Jr, M.D.   On: 08/15/2014 15:15   Dg Chest Port 1 View  08/15/2014   CLINICAL DATA:  Shortness of breath.  EXAM: PORTABLE CHEST - 1 VIEW  COMPARISON:  08/13/2014 and multiple prior chest radiographs  FINDINGS: Cardiomegaly and pulmonary vascular congestion again  noted.  A right IJ central venous catheter is noted with tips overlying the upper and mid SVC.  A left central venous catheter is present with tip overlying the lower SVC.  There is no evidence of focal airspace disease, pulmonary edema, suspicious pulmonary nodule/mass, pleural effusion, or pneumothorax. No acute bony abnormalities are identified.  IMPRESSION: Cardiomegaly with pulmonary vascular congestion.   Electronically Signed   By: Harmon PierJeffrey  Hu M.D.   On: 08/15/2014  17:28    Anti-infectives: Anti-infectives    Start     Dose/Rate Route Frequency Ordered Stop   08/12/14 1449  ceFAZolin (ANCEF) 2-3 GM-% IVPB SOLR    Comments:  Troy SineLaffan, Eileen   : cabinet override      08/12/14 1449 08/12/14 1629   08/12/14 1130  ceFAZolin (ANCEF) IVPB 2 g/50 mL premix    Comments:  Hang ON CALL to xray 5/11   2 g 100 mL/hr over 30 Minutes Intravenous To Radiology 08/12/14 1035 08/12/14 1520   08/04/14 1200  vancomycin (VANCOCIN) 50 mg/mL oral solution 125 mg  Status:  Discontinued     125 mg Oral 4 times per day 08/04/14 0851 08/08/14 1229   07/18/14 1800  vancomycin (VANCOCIN) 50 mg/mL oral solution 125 mg  Status:  Discontinued     125 mg Oral 4 times per day 07/18/14 1455 08/04/14 0845   07/17/14 1830  vancomycin (VANCOCIN) IVPB 750 mg/150 ml premix     750 mg 150 mL/hr over 60 Minutes Intravenous  Once 07/17/14 1818 07/18/14 0024   07/15/14 1400  metroNIDAZOLE (FLAGYL) tablet 500 mg  Status:  Discontinued     500 mg Oral 3 times per day 07/15/14 1202 07/18/14 1533   07/13/14 2200  piperacillin-tazobactam (ZOSYN) IVPB 2.25 g  Status:  Discontinued     2.25 g 100 mL/hr over 30 Minutes Intravenous 3 times per day 07/13/14 1318 07/24/14 1138   07/13/14 2000  fluconazole (DIFLUCAN) IVPB 200 mg  Status:  Discontinued     200 mg 100 mL/hr over 60 Minutes Intravenous Every 24 hours 07/13/14 1318 07/19/14 1027   07/07/14 1200  vancomycin (VANCOCIN) IVPB 1000 mg/200 mL premix  Status:  Discontinued     1,000 mg 200 mL/hr over 60 Minutes Intravenous Every 24 hours 07/07/14 0937 07/14/14 1712   07/06/14 2000  fluconazole (DIFLUCAN) IVPB 400 mg  Status:  Discontinued     400 mg 100 mL/hr over 120 Minutes Intravenous Every 24 hours 07/06/14 1507 07/13/14 1318   07/06/14 1800  piperacillin-tazobactam (ZOSYN) IVPB 2.25 g  Status:  Discontinued     2.25 g 100 mL/hr over 30 Minutes Intravenous 4 times per day 07/06/14 1505 07/13/14 1318   07/05/14 2000  fluconazole  (DIFLUCAN) IVPB 200 mg  Status:  Discontinued     200 mg 100 mL/hr over 60 Minutes Intravenous Every 24 hours 07/05/14 0757 07/06/14 1507   07/05/14 1400  piperacillin-tazobactam (ZOSYN) IVPB 2.25 g  Status:  Discontinued     2.25 g 100 mL/hr over 30 Minutes Intravenous 3 times per day 07/05/14 0749 07/06/14 1505   07/02/14 1800  vancomycin (VANCOCIN) IVPB 1000 mg/200 mL premix  Status:  Discontinued     1,000 mg 200 mL/hr over 60 Minutes Intravenous Every 24 hours 07/01/14 1858 07/05/14 0801   07/01/14 2200  piperacillin-tazobactam (ZOSYN) IVPB 3.375 g  Status:  Discontinued     3.375 g 100 mL/hr over 30 Minutes Intravenous 4 times per day 07/01/14 1858 07/05/14 0749  07/01/14 2000  fluconazole (DIFLUCAN) IVPB 400 mg  Status:  Discontinued     400 mg 100 mL/hr over 120 Minutes Intravenous Every 24 hours 07/01/14 1858 07/05/14 0757   07/01/14 1400  fluconazole (DIFLUCAN) IVPB 200 mg  Status:  Discontinued     200 mg 100 mL/hr over 60 Minutes Intravenous Every 24 hours 07/01/14 1330 07/01/14 1853   07/01/14 1000  piperacillin-tazobactam (ZOSYN) IVPB 2.25 g  Status:  Discontinued     2.25 g 100 mL/hr over 30 Minutes Intravenous Every 8 hours 07/01/14 0952 07/01/14 1853   07/01/14 1000  vancomycin (VANCOCIN) 500 mg in sodium chloride 0.9 % 100 mL IVPB     500 mg 100 mL/hr over 60 Minutes Intravenous  Once 07/01/14 0952 07/01/14 1126   06/30/14 1200  vancomycin (VANCOCIN) IVPB 1000 mg/200 mL premix     1,000 mg 200 mL/hr over 60 Minutes Intravenous  Once 06/30/14 0944 06/30/14 1403   06/30/14 1200  ceFEPIme (MAXIPIME) 2 g in dextrose 5 % 50 mL IVPB     2 g 100 mL/hr over 30 Minutes Intravenous  Once 06/30/14 0944 06/30/14 1425   06/30/14 0100  vancomycin (VANCOCIN) 2,000 mg in sodium chloride 0.9 % 500 mL IVPB     2,000 mg 250 mL/hr over 120 Minutes Intravenous  Once 06/30/14 0048 06/30/14 0525   06/30/14 0100  ceFEPIme (MAXIPIME) 2 g in dextrose 5 % 50 mL IVPB     2 g 100 mL/hr over  30 Minutes Intravenous  Once 06/30/14 0048 06/30/14 0322   06/27/14 0600  cefUROXime (ZINACEF) 1.5 g in dextrose 5 % 50 mL IVPB     1.5 g 100 mL/hr over 30 Minutes Intravenous On call to O.R. 06/26/14 1019 06/27/14 0630   06/19/14 1800  oseltamivir (TAMIFLU) capsule 30 mg     30 mg Oral Every M-W-F (1800) 06/19/14 1003 06/22/14 1841   06/18/14 1600  oseltamivir (TAMIFLU) capsule 30 mg  Status:  Discontinued     30 mg Oral Daily 06/18/14 1538 06/19/14 1003      Assessment/Plan: s/p Procedure(s): EXPLORATORY LAPAROTOMY WITH CLOSURE OF PERFORATED PYLORIC ULCER (N/A) SMALL BOWEL RESECTION (N/A) POD #47s/p Exploratory Laparotomy with graham patch closure of perforated pyloric ulcer, SBR for ischemic bowel - 07/01/2014 - Derrell Lolling  Intra-abdominal fluid collection, no drain   Now with duodenal fistula -CT 08/15/14 showed continued pre-rectal fluid collection. No drain was replaced in this pelvic fluid collection after last CT on 5/2 after previous drain accidentally pulled out. Given persistent WBC, will ask IR to aspirate/place drain and send fluid for culture.  -Fistula study done and shows JP drain near graham patch with connection to the duodenal bulb.  -Strict NPO, TPN. Sandostatin helping? -Wound vac discontinued, now on NS WD dressing changes TID due to significant drainage,  -I&Os and routine drain care  -IR completed GJ tube on 08/12/14. Hold trickle TF for now until we get CT scan back, concerned the jejunal TF are re-gurging up into the stomach since we are now seeing tan in the blake drainage.no overt sign of extravasation of oral contrast on CT yesterday. Would still hold trickle TF for now.  Monitor drain output. Dietitian following.   Inability to take PO/PCM - hold TF through J tube port for now VTE prophylaxis-SCDs  ID--Zosyn stopped on day #24 (07/24/14). WBC 16. Will ask IR to drain pelvic fluid collection C diff colitis New ESRD  PCM/TNA - at some point can wean TNA  if  she tolerates TF  Mary Sella. Andrey Campanile, MD, FACS General, Bariatric, & Minimally Invasive Surgery [facility], [place]   LOS: 60 days    Atilano Ina 08/16/2014

## 2014-08-17 ENCOUNTER — Inpatient Hospital Stay (HOSPITAL_COMMUNITY): Payer: Medicaid Other

## 2014-08-17 LAB — DIFFERENTIAL
BASOS ABS: 0 10*3/uL (ref 0.0–0.1)
Basophils Relative: 0 % (ref 0–1)
Eosinophils Absolute: 0.3 10*3/uL (ref 0.0–0.7)
Eosinophils Relative: 3 % (ref 0–5)
LYMPHS PCT: 10 % — AB (ref 12–46)
Lymphs Abs: 1 10*3/uL (ref 0.7–4.0)
MONO ABS: 1.3 10*3/uL — AB (ref 0.1–1.0)
MONOS PCT: 13 % — AB (ref 3–12)
NEUTROS PCT: 74 % (ref 43–77)
Neutro Abs: 7.3 10*3/uL (ref 1.7–7.7)

## 2014-08-17 LAB — GLUCOSE, CAPILLARY
Glucose-Capillary: 112 mg/dL — ABNORMAL HIGH (ref 65–99)
Glucose-Capillary: 156 mg/dL — ABNORMAL HIGH (ref 65–99)
Glucose-Capillary: 162 mg/dL — ABNORMAL HIGH (ref 65–99)
Glucose-Capillary: 96 mg/dL (ref 65–99)

## 2014-08-17 LAB — VITAMIN C: VITAMIN C: 0.2 mg/dL (ref 0.2–1.5)

## 2014-08-17 LAB — COMPREHENSIVE METABOLIC PANEL
ALT: 9 U/L — ABNORMAL LOW (ref 14–54)
ANION GAP: 10 (ref 5–15)
AST: 29 U/L (ref 15–41)
Albumin: 1.6 g/dL — ABNORMAL LOW (ref 3.5–5.0)
Alkaline Phosphatase: 162 U/L — ABNORMAL HIGH (ref 38–126)
BUN: 71 mg/dL — ABNORMAL HIGH (ref 6–20)
CALCIUM: 7.9 mg/dL — AB (ref 8.9–10.3)
CHLORIDE: 95 mmol/L — AB (ref 101–111)
CO2: 26 mmol/L (ref 22–32)
CREATININE: 4.8 mg/dL — AB (ref 0.44–1.00)
GFR calc Af Amer: 11 mL/min — ABNORMAL LOW (ref >60)
GFR, EST NON AFRICAN AMERICAN: 9 mL/min — AB (ref >60)
GLUCOSE: 68 mg/dL (ref 65–99)
Potassium: 4.2 mmol/L (ref 3.5–5.1)
Sodium: 131 mmol/L — ABNORMAL LOW (ref 135–145)
Total Bilirubin: 1.1 mg/dL (ref 0.3–1.2)
Total Protein: 6.3 g/dL — ABNORMAL LOW (ref 6.5–8.1)

## 2014-08-17 LAB — MAGNESIUM: Magnesium: 2.3 mg/dL (ref 1.7–2.4)

## 2014-08-17 LAB — CBC
HCT: 36.3 % (ref 36.0–46.0)
Hemoglobin: 11.3 g/dL — ABNORMAL LOW (ref 12.0–15.0)
MCH: 28.2 pg (ref 26.0–34.0)
MCHC: 31.1 g/dL (ref 30.0–36.0)
MCV: 90.5 fL (ref 78.0–100.0)
PLATELETS: 203 10*3/uL (ref 150–400)
RBC: 4.01 MIL/uL (ref 3.87–5.11)
RDW: 19.3 % — ABNORMAL HIGH (ref 11.5–15.5)
WBC: 9.8 10*3/uL (ref 4.0–10.5)

## 2014-08-17 LAB — TRIGLYCERIDES: Triglycerides: 142 mg/dL (ref <150)

## 2014-08-17 LAB — PREALBUMIN: Prealbumin: 7.9 mg/dL — ABNORMAL LOW (ref 18–38)

## 2014-08-17 LAB — PHOSPHORUS: PHOSPHORUS: 6.2 mg/dL — AB (ref 2.5–4.6)

## 2014-08-17 MED ORDER — MIDAZOLAM HCL 2 MG/2ML IJ SOLN
INTRAMUSCULAR | Status: AC
  Filled 2014-08-17: qty 2

## 2014-08-17 MED ORDER — FAT EMULSION 20 % IV EMUL
240.0000 mL | INTRAVENOUS | Status: AC
  Administered 2014-08-17: 240 mL via INTRAVENOUS
  Filled 2014-08-17: qty 250

## 2014-08-17 MED ORDER — CLINIMIX/DEXTROSE (5/15) 5 % IV SOLN
INTRAVENOUS | Status: AC
  Administered 2014-08-17: 18:00:00 via INTRAVENOUS
  Filled 2014-08-17: qty 2400

## 2014-08-17 MED ORDER — LIDOCAINE HCL 1 % IJ SOLN
INTRAMUSCULAR | Status: AC
  Filled 2014-08-17: qty 20

## 2014-08-17 MED ORDER — FENTANYL CITRATE (PF) 100 MCG/2ML IJ SOLN
INTRAMUSCULAR | Status: AC
  Filled 2014-08-17: qty 2

## 2014-08-17 MED ORDER — FENTANYL CITRATE (PF) 100 MCG/2ML IJ SOLN
INTRAMUSCULAR | Status: AC | PRN
  Administered 2014-08-17 (×2): 25 ug via INTRAVENOUS

## 2014-08-17 NOTE — Sedation Documentation (Signed)
Patient c/o discomfort

## 2014-08-17 NOTE — Progress Notes (Signed)
Notified Dr. Mahala MenghiniSamtani regarding drop in b/p after giving Metoprol 5mg  iv. B/p noted to be 82/56 hr 91 94% saturation on room air. VS monitored every 15 mins thereafter.

## 2014-08-17 NOTE — Progress Notes (Signed)
MD at bedside for dressing changed. Wet to dry dressing applied to abd area. No purulent drainage noted. No odor.

## 2014-08-17 NOTE — Progress Notes (Signed)
Rehab admissions - Patient known to me from previous inpatient rehab consult done on 06/24/14.  Dr. Riley KillSwartz saw patient today and feels patient would be appropriate for inpatient rehab.  OT recommending SNF and PT recommending inpatient rehab stay.  Currently has ongoing medical issues and is not yet medically ready for inpatient rehab.  I will follow progress for now.  Call me for questions.  #[phone]

## 2014-08-17 NOTE — Progress Notes (Signed)
Patient refused CPAP. RT will continue to monitor.  

## 2014-08-17 NOTE — Sedation Documentation (Signed)
Patient is resting comfortably. 

## 2014-08-17 NOTE — Progress Notes (Signed)
  [facility] Progress Note   Subjective: no complaints   Filed Vitals:   08/17/14 0415 08/17/14 0730 08/17/14 0755 08/17/14 1159  BP: 106/59  100/58 91/60  Pulse: 95  100 97  Temp:   98.2 F (36.8 C) 98.5 F (36.9 C)  TempSrc:   Oral Oral  Resp: 15  17 19   Height:      Weight:      SpO2: 98% 99% 100% 95%   Exam: Alert, no distress No jvd Chest clear bilat RRR no MRG Abd large dressing, PEG tube in place, drain RUQ No LE or UE edema Neuro is alert ox 3 HD cath  HD: new start        Assessment: 1. CVA 2. Perf pyloric ulcer SP exlap, now w duod fistula 3. C diff 4. Anemia on darbe 5. ESRD new start TTS for now 6. HD access needs revision of AVF when healthier 7. HypoPO4, resolved 8. Parox SVT 9. 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  Plan - HD Tuesday    Vinson Moselleob Brynden Thune MD  pager [phone]    cell [phone]  08/17/2014, 12:00 PM     Recent Labs Lab 08/15/14 0430 08/16/14 0641 08/17/14 0430  NA 131* 132* 131*  K 3.0* 4.4 4.2  CL 98* 95* 95*  CO2 23 28 26   GLUCOSE 128* 127* 68  BUN 66* 45* 71*  CREATININE 4.23* 3.59* 4.80*  CALCIUM 7.4* 7.6* 7.9*  PHOS 3.8 4.0 6.2*    Recent Labs Lab 08/13/14 0322 08/14/14 0615 08/17/14 0430  AST 23 21 29   ALT 14 10* 9*  ALKPHOS 170* 157* 162*  BILITOT 0.8 1.2 1.1  PROT 6.9 6.2* 6.3*  ALBUMIN 1.7* 1.5* 1.6*    Recent Labs Lab 08/15/14 1045 08/16/14 0641 08/17/14 0430  WBC 14.4* 16.0* 9.8  NEUTROABS  --   --  7.3  HGB 9.2* 8.6* 11.3*  HCT 29.6* 28.2* 36.3  MCV 88.1 90.4 90.5  PLT 276 250 203   . antiseptic oral rinse  7 mL Mouth Rinse q12n4p  . budesonide (PULMICORT) nebulizer solution  0.25 mg Nebulization BID  . chlorhexidine  15 mL Mouth Rinse BID  . [START ON 08/18/2014] darbepoetin (ARANESP) injection - DIALYSIS  200 mcg Intravenous Q Tue-HD  . ferric gluconate (FERRLECIT/NULECIT) IV  125 mg Intravenous Q T,Th,Sa-HD  . insulin aspart  0-9 Units Subcutaneous 4 times per day  .  levothyroxine  12.5 mcg Intravenous Daily  . metoprolol  5 mg Intravenous Q4H  . sodium chloride  10-40 mL Intracatheter Q12H   . sodium chloride 10 mL/hr at 08/10/14 2300  . Marland Kitchen.TPN (CLINIMIX-E) Adult 100 mL/hr at 08/16/14 1750   And  . fat emulsion 240 mL (08/16/14 1750)  . TPN (CLINIMIX) Adult without lytes     And  . fat emulsion    . feeding supplement (VITAL AF 1.2 CAL) Stopped (08/15/14 1030)  . octreotide  (SANDOSTATIN)    IV infusion 50 mcg/hr (08/16/14 1133)   bisacodyl, camphor-menthol, diphenhydrAMINE, diphenhydrAMINE-zinc acetate, HYDROmorphone (DILAUDID) injection, levalbuterol, ondansetron (ZOFRAN) IV, sodium chloride

## 2014-08-17 NOTE — Progress Notes (Signed)
Continues to be in interventional radiology at this  Time.

## 2014-08-17 NOTE — Progress Notes (Signed)
[facility] Progress Note  47 Days Post-Op  Subjective: Pt overall feels, much better than a few weeks ago.  No significant pain except with dressing changes.  Blake drain is no longer to suction, as ordered.  Also her J-tube port is to gravity, rather it should be the G-tube port.  She notes a small BM on 08/15/14.  Minimal flatus.  Objective: Vital signs in last 24 hours: Temp:  [97.8 F (36.6 C)-98.7 F (37.1 C)] 98.1 F (36.7 C) (05/16 0400) Pulse Rate:  [85-107] 95 (05/16 0415) Resp:  [12-22] 15 (05/16 0415) BP: (82-140)/(47-69) 106/59 mmHg (05/16 0415) SpO2:  [96 %-100 %] 99 % (05/16 0730) Last BM Date: 08/15/14  Intake/Output from previous day: 05/15 0701 - 05/16 0700 In: 2810 [I.V.:500; TPN:2310] Out: 635 [Drains:635] Intake/Output this shift:    PE: Gen: Alert, NAD, pleasant Abd: Soft, mildly tender, ND, +BS, no HSM, midline wound with yellow slough at base and some purulent looking drainage at inferior aspect, some wound dehiscence at inferior aspect, sutures exposed, some granulation tissue formation at edges, Blake drain in Right abdomen with 566mL/24hr bilious, bilious output in J-tube drainage as well 171mL/24hr (should be G-tube to gravity)   Lab Results:   Recent Labs  08/16/14 0641 08/17/14 0430  WBC 16.0* 9.8  HGB 8.6* 11.3*  HCT 28.2* 36.3  PLT 250 203   BMET  Recent Labs  08/16/14 0641 08/17/14 0430  NA 132* 131*  K 4.4 4.2  CL 95* 95*  CO2 28 26  GLUCOSE 127* 68  BUN 45* 71*  CREATININE 3.59* 4.80*  CALCIUM 7.6* 7.9*   PT/INR No results for input(s): LABPROT, INR in the last 72 hours. CMP     Component Value Date/Time   NA 131* 08/17/2014 0430   NA 139 10/02/2012 1046   K 4.2 08/17/2014 0430   K 4.4 10/02/2012 1046   CL 95* 08/17/2014 0430   CO2 26 08/17/2014 0430   CO2 25 10/02/2012 1046   GLUCOSE 68 08/17/2014 0430   GLUCOSE 110 10/02/2012 1046   BUN 71* 08/17/2014 0430   BUN 35.2* 10/02/2012 1046    CREATININE 4.80* 08/17/2014 0430   CREATININE 2.5* 10/02/2012 1046   CALCIUM 7.9* 08/17/2014 0430   CALCIUM 9.8 10/02/2012 1046   PROT 6.3* 08/17/2014 0430   PROT 8.4* 10/02/2012 1046   ALBUMIN 1.6* 08/17/2014 0430   ALBUMIN 3.1* 10/02/2012 1046   AST 29 08/17/2014 0430   AST 9 10/02/2012 1046   ALT 9* 08/17/2014 0430   ALT <6 Repeated and Verified 10/02/2012 1046   ALKPHOS 162* 08/17/2014 0430   ALKPHOS 108 10/02/2012 1046   BILITOT 1.1 08/17/2014 0430   BILITOT 0.21 10/02/2012 1046   GFRNONAA 9* 08/17/2014 0430   GFRAA 11* 08/17/2014 0430   Lipase  No results found for: LIPASE     Studies/Results: Ct Abdomen Pelvis Wo Contrast  08/15/2014   CLINICAL DATA:  Intra abdominal abscess.  EXAM: CT ABDOMEN AND PELVIS WITHOUT CONTRAST  TECHNIQUE: Multidetector CT imaging of the abdomen and pelvis was performed following the standard protocol without IV contrast.  COMPARISON:  CT scan of Aug 03, 2014.  FINDINGS: Severe degenerative disc disease is noted at L5-S1. Minimal subsegmental atelectasis is noted posteriorly in the left lung base.  No gallstones are noted. No focal abnormality is noted in the liver, spleen or pancreas on these unenhanced images. Adrenal glands appear normal. Stable exophytic cyst is seen arising from midpole of left kidney. No  hydronephrosis or renal obstruction is noted. No renal or ureteral calculi are noted. 3 cm infrarenal abdominal aortic aneurysm is noted. Gastrojejunostomy tube is seen entering stomach percutaneously and with distal tip in the jejunum. JP surgical drain is seen entering right upper quadrant of abdomen underneath the liver with distal tip in the left upper quadrant. No significant fluid collection is noted around the drain. Fluid collection is again identified and posterior cul-de-sac of pelvis measuring 5.1 x 3.2 cm. Calcified degenerating fibroid is noted in the uterus. Urinary bladder is unremarkable. There is no evidence of bowel obstruction. The  appendix appears normal. No significant adenopathy is noted. Large midline surgical anterior abdominal wall incision is noted.  IMPRESSION: Stable 3 cm infrarenal abdominal aortic aneurysm.  No change in position of JP surgical drain with distal tip in the left upper quadrant of the abdomen.  Interval placement of percutaneous gastrojejunostomy tube with distal tip in the jejunum.  Grossly stable size and appearance of 5 cm fluid collection seen in posterior cul-de-sac of the pelvis.  Continued presence uterine fibroids.   Electronically Signed   By: Lupita RaiderJames  Green Jr, M.D.   On: 08/15/2014 15:15   Dg Chest Port 1 View  08/15/2014   CLINICAL DATA:  Shortness of breath.  EXAM: PORTABLE CHEST - 1 VIEW  COMPARISON:  08/13/2014 and multiple prior chest radiographs  FINDINGS: Cardiomegaly and pulmonary vascular congestion again noted.  A right IJ central venous catheter is noted with tips overlying the upper and mid SVC.  A left central venous catheter is present with tip overlying the lower SVC.  There is no evidence of focal airspace disease, pulmonary edema, suspicious pulmonary nodule/mass, pleural effusion, or pneumothorax. No acute bony abnormalities are identified.  IMPRESSION: Cardiomegaly with pulmonary vascular congestion.   Electronically Signed   By: Harmon PierJeffrey  Hu M.D.   On: 08/15/2014 17:28    Anti-infectives: Anti-infectives    Start     Dose/Rate Route Frequency Ordered Stop   08/12/14 1449  ceFAZolin (ANCEF) 2-3 GM-% IVPB SOLR    Comments:  Troy SineLaffan, Eileen   : cabinet override      08/12/14 1449 08/12/14 1629   08/12/14 1130  ceFAZolin (ANCEF) IVPB 2 g/50 mL premix    Comments:  Hang ON CALL to xray 5/11   2 g 100 mL/hr over 30 Minutes Intravenous To Radiology 08/12/14 1035 08/12/14 1520   08/04/14 1200  vancomycin (VANCOCIN) 50 mg/mL oral solution 125 mg  Status:  Discontinued     125 mg Oral 4 times per day 08/04/14 0851 08/08/14 1229   07/18/14 1800  vancomycin (VANCOCIN) 50 mg/mL oral  solution 125 mg  Status:  Discontinued     125 mg Oral 4 times per day 07/18/14 1455 08/04/14 0845   07/17/14 1830  vancomycin (VANCOCIN) IVPB 750 mg/150 ml premix     750 mg 150 mL/hr over 60 Minutes Intravenous  Once 07/17/14 1818 07/18/14 0024   07/15/14 1400  metroNIDAZOLE (FLAGYL) tablet 500 mg  Status:  Discontinued     500 mg Oral 3 times per day 07/15/14 1202 07/18/14 1533   07/13/14 2200  piperacillin-tazobactam (ZOSYN) IVPB 2.25 g  Status:  Discontinued     2.25 g 100 mL/hr over 30 Minutes Intravenous 3 times per day 07/13/14 1318 07/24/14 1138   07/13/14 2000  fluconazole (DIFLUCAN) IVPB 200 mg  Status:  Discontinued     200 mg 100 mL/hr over 60 Minutes Intravenous Every 24 hours 07/13/14 1318 07/19/14  1027   07/07/14 1200  vancomycin (VANCOCIN) IVPB 1000 mg/200 mL premix  Status:  Discontinued     1,000 mg 200 mL/hr over 60 Minutes Intravenous Every 24 hours 07/07/14 0937 07/14/14 1712   07/06/14 2000  fluconazole (DIFLUCAN) IVPB 400 mg  Status:  Discontinued     400 mg 100 mL/hr over 120 Minutes Intravenous Every 24 hours 07/06/14 1507 07/13/14 1318   07/06/14 1800  piperacillin-tazobactam (ZOSYN) IVPB 2.25 g  Status:  Discontinued     2.25 g 100 mL/hr over 30 Minutes Intravenous 4 times per day 07/06/14 1505 07/13/14 1318   07/05/14 2000  fluconazole (DIFLUCAN) IVPB 200 mg  Status:  Discontinued     200 mg 100 mL/hr over 60 Minutes Intravenous Every 24 hours 07/05/14 0757 07/06/14 1507   07/05/14 1400  piperacillin-tazobactam (ZOSYN) IVPB 2.25 g  Status:  Discontinued     2.25 g 100 mL/hr over 30 Minutes Intravenous 3 times per day 07/05/14 0749 07/06/14 1505   07/02/14 1800  vancomycin (VANCOCIN) IVPB 1000 mg/200 mL premix  Status:  Discontinued     1,000 mg 200 mL/hr over 60 Minutes Intravenous Every 24 hours 07/01/14 1858 07/05/14 0801   07/01/14 2200  piperacillin-tazobactam (ZOSYN) IVPB 3.375 g  Status:  Discontinued     3.375 g 100 mL/hr over 30 Minutes  Intravenous 4 times per day 07/01/14 1858 07/05/14 0749   07/01/14 2000  fluconazole (DIFLUCAN) IVPB 400 mg  Status:  Discontinued     400 mg 100 mL/hr over 120 Minutes Intravenous Every 24 hours 07/01/14 1858 07/05/14 0757   07/01/14 1400  fluconazole (DIFLUCAN) IVPB 200 mg  Status:  Discontinued     200 mg 100 mL/hr over 60 Minutes Intravenous Every 24 hours 07/01/14 1330 07/01/14 1853   07/01/14 1000  piperacillin-tazobactam (ZOSYN) IVPB 2.25 g  Status:  Discontinued     2.25 g 100 mL/hr over 30 Minutes Intravenous Every 8 hours 07/01/14 0952 07/01/14 1853   07/01/14 1000  vancomycin (VANCOCIN) 500 mg in sodium chloride 0.9 % 100 mL IVPB     500 mg 100 mL/hr over 60 Minutes Intravenous  Once 07/01/14 0952 07/01/14 1126   06/30/14 1200  vancomycin (VANCOCIN) IVPB 1000 mg/200 mL premix     1,000 mg 200 mL/hr over 60 Minutes Intravenous  Once 06/30/14 0944 06/30/14 1403   06/30/14 1200  ceFEPIme (MAXIPIME) 2 g in dextrose 5 % 50 mL IVPB     2 g 100 mL/hr over 30 Minutes Intravenous  Once 06/30/14 0944 06/30/14 1425   06/30/14 0100  vancomycin (VANCOCIN) 2,000 mg in sodium chloride 0.9 % 500 mL IVPB     2,000 mg 250 mL/hr over 120 Minutes Intravenous  Once 06/30/14 0048 06/30/14 0525   06/30/14 0100  ceFEPIme (MAXIPIME) 2 g in dextrose 5 % 50 mL IVPB     2 g 100 mL/hr over 30 Minutes Intravenous  Once 06/30/14 0048 06/30/14 0322   06/27/14 0600  cefUROXime (ZINACEF) 1.5 g in dextrose 5 % 50 mL IVPB     1.5 g 100 mL/hr over 30 Minutes Intravenous On call to O.R. 06/26/14 1019 06/27/14 0630   06/19/14 1800  oseltamivir (TAMIFLU) capsule 30 mg     30 mg Oral Every M-W-F (1800) 06/19/14 1003 06/22/14 1841   06/18/14 1600  oseltamivir (TAMIFLU) capsule 30 mg  Status:  Discontinued     30 mg Oral Daily 06/18/14 1538 06/19/14 1003       Assessment/Plan  POD #47 s/p Exploratory Laparotomy with graham patch closure of perforated pyloric ulcer, SBR for ischemic bowel - 07/01/2014 - Derrell Lolling   Intra-abdominal (pelvic) fluid collection, pending CT guided IR Perc aspiration/drainage today Now with duodenal fistula -CT 08/15/14 showed continued pre-rectal fluid collection. No drain was replaced in this pelvic fluid collection after last CT on 5/2 after previous drain accidentally pulled out. Given persistent WBC, will ask IR to aspirate/place drain and send fluid for culture.  -Fistula study done and shows JP drain near graham patch with connection to the duodenal bulb.  -Strict NPO, TPN. Sandostatin helping? -Wound vac discontinued, now on NS WD dressing changes TID due to significant drainage -I&Os and routine drain care.  Keep Blake drain up to suction since such high volume.  Keep G-tube to gravity. -IR completed GJ tube on 08/12/14. Hold trickle TF for now, the jejunal TF were re-gurging up into the stomach into the blake drainage.The J tube does not look to be coiled up on the CT scan.  No overt sign of extravasation of oral contrast on CT yesterday. Monitor drain output. Dietitian following.   Inability to take PO/PCM - hold TF through J tube port for now VTE prophylaxis-SCDs  ID-- WBC 16.0 yesterday, now 9.8 today, pending IR perc drain today to pelvic fluid collection.  Wound culture shows gram negative rods, abundant pseudomonas, sensitivities still pending.  Recommend ID consult, may need to restart zosyn or try cefepime but will leave this up to them given her recent c.diff colitis. C diff colitis New ESRD PCM/TNA - at some point can wean TNA if she tolerates TF.   She should not be discharged until we can get her better nutritional support.    LOS: 61 days    DORT, Aundra Millet 08/17/2014, 7:31 AM Pager: [phone]

## 2014-08-17 NOTE — Progress Notes (Signed)
RT went to check with patient about CPAP. Patient and RT talked and she is comfortable wearing her nasal canula tonight. RT Encouraged patient to call if she changes her mind.

## 2014-08-17 NOTE — Progress Notes (Signed)
Occupational Therapy Treatment Patient Details Name: [name] MRN: [id number] DOB: 06/08/1955 Today's Date: 08/17/2014    History of present illness [name] is a 59 y.o. female with a history of CKD and hypertension [facility]16 complaining of shortness of breath and generalized weakness x 2 weeks. Admitted with acute hypoxemic respiratory failure. During hospital stay noted to have weakness of her left side. 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  She developed septic shock 4/1 with pressors weaned off 4/3.  Began CRRT and pressors restarted 4/4; Developed pelvic abcess 4/7 with pelvic drain placed 4/8.  weaned off pressors 4/9.  Runs of SVT 4/5 and 4/11   OT comments  Focus of session on bed mobility, sit to stand and side stepping along EOB.  Pt able to wash face with set up and sat EOB with supervision. Stood x 2 with +2 min assist, placement of LLE and blocking of knee to prevent buckling. Pt tearful and at one point asked, "Why are you doing this to me when my legs hurt?" Flat affect otherwise. Brought pt large print word search book for which she was grateful.  Follow Up Recommendations  SNF;Supervision/Assistance - 24 hour    Equipment Recommendations       Recommendations for Other Services      Precautions / Restrictions Precautions Precautions: Fall Precaution Comments: JP drain       Mobility Bed Mobility Overal bed mobility: Needs Assistance Bed Mobility: Rolling;Supine to Sit;Sit to Supine Rolling: Min assist   Supine to sit: +2 for physical assistance;Min assist Sit to supine: Mod assist   General bed mobility comments: assist with UEs to elevate trunk with supine to sit, assist for LEs with sit to supine  Transfers Overall transfer level: Needs assistance Equipment used: Rolling walker (2  wheeled) Transfers: Sit to/from Stand Sit to Stand: +2 physical assistance;Min assist;From elevated surface (hands up on walker)         General transfer comment: no addressed due to impending test    Balance     Sitting balance-Leahy Scale: Good Sitting balance - Comments: more assist to shift L hip to EOB vs R, used bed pad     Standing balance-Leahy Scale: Poor Standing balance comment: assist to position L LE for standing, buckling noted requiring knee to be blocked as [facility]                   ADL Overall ADL's : Needs assistance/impaired     Grooming: Wash/dry face;Sitting;Set up                                 General ADL Comments: sat EOB approximately 8 minutes total with supervision      Vision                     Perception     Praxis      Cognition   Behavior During Therapy: Flat affect Overall Cognitive Status: Within Functional Limits for tasks assessed                  General Comments: Pt with minimal verbalizations.    Extremity/Trunk Assessment  Exercises     Shoulder Instructions       General Comments      Pertinent Vitals/ Pain       Pain Assessment: Faces Faces Pain Scale: Hurts even more Pain Location: LEs, with standing Pain Descriptors / Indicators: Sore Pain Intervention(s): Limited activity within patient's tolerance;Monitored during session;Repositioned  Home Living                                          Prior Functioning/Environment              Frequency Min 2X/week     Progress Toward Goals  OT Goals(current goals can now be found in the care plan section)  Progress towards OT goals: Progressing toward goals  Acute Rehab OT Goals Time For Goal Achievement: 08/31/14  Plan Discharge plan remains appropriate    Co-evaluation    PT/OT/SLP Co-Evaluation/Treatment: Yes Reason for Co-Treatment: For patient/therapist  safety;Complexity of the patient's impairments (multi-system involvement)   OT goals addressed during session: Strengthening/ROM      End of Session Equipment Utilized During Treatment: Rolling walker   Activity Tolerance Patient tolerated treatment well   Patient Left in bed;with call bell/phone within reach   Nurse Communication Mobility status (BP 75/59, 02 sats in 90s on RA)        Time: 1005-1026 OT Time Calculation (min): 21 min  Charges: OT General Charges $OT Visit: 1 Procedure OT Treatments $Therapeutic Activity: 8-22 mins  [name], Roshawn Ayala Lynn 08/17/2014, 10:55 AM  [phone]

## 2014-08-17 NOTE — Progress Notes (Signed)
Instructed by Aris GeorgiaMegan Dort, PA to only give 30ml ice chips per hour.

## 2014-08-17 NOTE — Progress Notes (Signed)
Physical Therapy Treatment Patient Details Name: [name] MRN: [id number] DOB: 04-15-1955 Today's Date: 08/17/2014    History of Present Illness Stephanie Stephanie HawthorneC [name] is a 59 y.o. female with a history of CKD and hypertension [facility] complaining of shortness of breath and generalized weakness x 2 weeks. Admitted with acute hypoxemic respiratory failure. During hospital stay noted to have weakness of her left side. 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  She developed septic shock 4/1 with pressors weaned off 4/3.  Began CRRT and pressors restarted 4/4; Developed pelvic abcess 4/7 with pelvic drain placed 4/8.  weaned off pressors 4/9.  Runs of SVT 4/5 and 4/11    PT Comments    Pt admitted with above diagnosis. Pt currently with functional limitations due to the deficits listed below (see PT Problem List). Pt continues to progress slowly.  Able to stand at EOB however could not take steps forward and could not stand for very long <3-5 seconds without fatigue.   Pt did side step up  To HOB.  Continue to follow acutely.  Pt will benefit from skilled PT to increase their independence and safety with mobility to allow discharge to the venue listed below.    Follow Up Recommendations  CIR     Equipment Recommendations  Other (comment) (TBA)    Recommendations for Other Services Rehab consult     Precautions / Restrictions Precautions Precautions: Fall Precaution Comments: JP drain Restrictions Weight Bearing Restrictions: No    Mobility  Bed Mobility Overal bed mobility: Needs Assistance Bed Mobility: Rolling;Supine to Sit;Sit to Supine Rolling: Min assist   Supine to sit: +2 for physical assistance;Min assist Sit to supine: Mod assist   General bed mobility comments: assist with UEs to elevate trunk with supine to sit, assist for LEs  with sit to supine  Transfers Overall transfer level: Needs assistance Equipment used: Rolling [name] (2 wheeled) Transfers: Sit to/from Stand Sit to Stand: +2 physical assistance;Min assist;From elevated surface (hands up on [name])         General transfer comment: Only able to perform sit to stand and a [facility] steps [facility].  Side [facility] with max cues and assist to weight shift.  Pt needed assist to block/stabilize left LE as it is weak.   Ambulation/Gait                 Stairs            Wheelchair Mobility    Modified Rankin (Stroke Patients Only) Modified Rankin (Stroke Patients Only) Pre-Morbid Rankin Score: Slight disability Modified Rankin: Severe disability     Balance Overall balance assessment: Needs assistance;History of Falls Sitting-balance support: No upper extremity supported;Feet supported Sitting balance-Leahy Scale: Good Sitting balance - Comments: more assist to shift L hip to EOB vs R, used bed pad Postural control: Posterior lean Standing balance support: Bilateral upper extremity supported;During functional activity Standing balance-Leahy Scale: Poor Standing balance comment: Assist to position L LE for standing, buckling noted requiring knee to be blocked as pt took side steps to [facility].                     Cognition Arousal/Alertness: Awake/alert Behavior During Therapy: Flat affect Overall Cognitive Status: Within Functional Limits for  tasks assessed Area of Impairment: Attention   Current Attention Level: Selective           General Comments: Pt with minimal verbalizations.    Exercises      General Comments        Pertinent Vitals/Pain Pain Assessment: Faces Faces Pain Scale: Hurts even more Pain Location: Les with standing Pain Descriptors / Indicators: Aching;Sore Pain Intervention(s): Limited activity within patient's tolerance;Monitored during session;Repositioned  VSS  throughout session with HR 91-108 bpm;  BP 121/99 initially however decr to 75/59 after sitting EOB 10 min.  88/57 when back to supine.  94% RA O2.  Pt asymptomatic evenwith low BP.      Home Living                      Prior Function            PT Goals (current goals can now be found in the care plan section) Progress towards PT goals: Progressing toward goals    Frequency  Min 3X/week    PT Plan Current plan remains appropriate    Co-evaluation PT/OT/SLP Co-Evaluation/Treatment: Yes Reason for Co-Treatment: For patient/therapist safety PT goals addressed during session: Mobility/safety with mobility       End of Session Equipment Utilized During Treatment: Gait belt (and bed pad) Activity Tolerance: Patient limited by fatigue Patient left: in bed;with call bell/phone within reach     Time: 1000-1026 PT Time Calculation (min) (ACUTE ONLY): 26 min  Charges:  $Therapeutic Activity: 8-22 mins                    G CodesBerline [name]:      [name] 08/17/2014, 3:13 PM [name],PT Acute Rehabilitation [phone])

## 2014-08-17 NOTE — H&P (Signed)
  Physical Medicine and Rehabilitation Admission H&P    Chief Complaint  Patient presents with  . Shortness of Breath  . Asthma  . Cough  : HPI: [name] is a 59 y.o. right handed female with history of stage V chronic kidney disease status post fistula placement followed by nephrologist at [facility] Dr. Alexander, hypertension, tobacco abuse. Independent prior to admission living with her daughter. Presented 06/17/2014 to [facility] with progressive shortness of breath 2 weeks and lower extremity edema. By report patient has stopped taking her diuretics 2 weeks ago. Findings of elevated creatinine 5.5 baseline of 2.64. Also noted poor appetite related to symptoms of upper respiratory infection. Chest x-ray showed no infiltrate or failure. Renal ultrasound with no hydronephrosis. 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 Nephrology service follow-up placed on intravenous Lasix. Neurology consulted for left sided weakness. MRI of the brain 06/20/2014 shows multifocal areas of acute infarct throughout both hemispheres with the largest involving the right medial parasagittal frontal and posterior frontal cortex. MRA with no large vessel occlusion. Carotid Dopplers with no ICA stenosis. Echocardiogram with ejection fraction 65% and grade 1 diastolic dysfunction. Patient placed on full dose aspirin therapy. TEE performed showing normal LV function and no thrombus. Positive bubble study indicating a small PFO. Lower extremity Dopplers negative for DVT. Underwent placement of loop recorder 06/25/2014 per Dr. Klein. Patient with intermittent runs of PSVT placed on beta blocker. Renal function without change and hemodialysis initiated right internal jugular DIATEK catheter placed 06/27/2014 and later with placement of left arm AV fistula 09/09/2014. Developed acute abdominal pain 07/01/2014. CT scan  showed massive pneumoperitoneum and ascites and also pneumatosis of a short segment of the ileum. General surgery consulted underwent exploratory laparotomy evacuation of ascites with closure of perforated pyloric ulcer with omental patch and segmental resection of ileum 07/01/2014 per Dr. Ingram. Patient required full ventilatory support followed by critical care medicine. Wound care nurse consulted for need for VAC to abdominal wound. Placed on contact precautions for Clostridium difficile. Follow-up CT of abdomen showed no signs of abscess. Red rubber drain has been removed. Patient is currently nothing by mouth with interventional radiology placing gastrostomy tube 08/12/2014 and diet advanced to mechanical soft and still receiving tube feeds 12 hours. Therapies initiated slow overall progress. M.D. has requested physical medicine rehabilitation. Patient was admitted for a comprehensive rehabilitation program consult  Review of Systems  Constitutional: Negative for fever and chills.  Respiratory: Positive for cough.   Gastrointestinal: Positive for diarrhea.  Musculoskeletal: Negative for myalgias.  Neurological: Negative for headaches.   Review of Systems  Constitutional. Negative fever negative chills Skin. Negative rash Respiratory: Positive for shortness of breath on exertion.  Cardiovascular: Positive for leg swelling, palpitations.  Gastrointestinal: Positive for constipation, nausea, heartburn. Denies abdominal pain Musculoskeletal: Positive for myalgias, joint pain.  Psychiatric/Behavioral: The patient has insomnia, depression.  All other systems reviewed and are negative   Past Medical History  Diagnosis Date  . Asthma   . Hypertension   . Gout   . Arthritis   . Insomnia   . Chronic kidney disease   . Depression   . Renal insufficiency   . Aortic aneurysm without rupture     3cm by CT 08/03/14  . Paroxysmal SVT (supraventricular tachycardia)   . Cor pulmonale   . Stroke  06/2014  . PFO (patent foramen ovale)   . COPD (chronic obstructive pulmonary   disease)    Past Surgical History  Procedure Laterality Date  . Multiple tooth extractions    . Av fistula placement Left 01/28/2014    Procedure: ARTERIOVENOUS (AV) FISTULA CREATION;  Surgeon: Charles E Fields, MD;  Location: [facility] OR;  Service: Vascular;  Laterality: Left;  . Tee without cardioversion N/A 06/23/2014    Procedure: TRANSESOPHAGEAL ECHOCARDIOGRAM (TEE);  Surgeon: Brian S Crenshaw, MD;  Location: [facility] ENDOSCOPY;  Service: Cardiovascular;  Laterality: N/A;  . Insertion of dialysis catheter Right 06/27/2014    Procedure: INSERTION OF Right Internal Jugular DIATEK CATHETER;  Surgeon: James D Lawson, MD;  Location: [facility] OR;  Service: Vascular;  Laterality: Right;  . Laparotomy N/A 07/01/2014    Procedure: EXPLORATORY LAPAROTOMY WITH CLOSURE OF PERFORATED PYLORIC ULCER;  Surgeon: Haywood Ingram, MD;  Location: [facility] OR;  Service: General;  Laterality: N/A;  . Bowel resection N/A 07/01/2014    Procedure: SMALL BOWEL RESECTION;  Surgeon: Haywood Ingram, MD;  Location: [facility] OR;  Service: General;  Laterality: N/A;   Family History  Problem Relation Age of Onset  . Diabetes Mother   . Hypertension Mother   . Heart disease Mother   . Heart attack Mother   . Peripheral vascular disease Mother   . Diabetes Father   . Heart disease Father   . Hypertension Father   . Diabetes Sister   . Hypertension Sister   . Heart disease Sister     before age 60  . Heart attack Sister   . Peripheral vascular disease Sister   . Heart disease Brother   . Hyperlipidemia Daughter    Social History:  reports that she has been smoking Cigarettes.  She has a 10 pack-year smoking history. She has never used smokeless tobacco. She reports that she drinks alcohol. She reports that she does not use illicit drugs. Allergies: No Known Allergies Medications Prior to Admission  Medication Sig Dispense Refill  . allopurinol (ZYLOPRIM) 100 MG  tablet Take 100 mg by mouth 2 (two) times daily as needed (for gout).    . citalopram (CELEXA) 20 MG tablet Take 20 mg by mouth daily.    . furosemide (LASIX) 80 MG tablet Take 80 mg by mouth daily.    . hydrOXYzine (ATARAX/VISTARIL) 25 MG tablet Take 25 mg by mouth 3 (three) times daily as needed for anxiety.    . oxyCODONE (ROXICODONE) 5 MG immediate release tablet Take 1 tablet (5 mg total) by mouth every 6 (six) hours as needed for severe pain. (Patient not taking: Reported on 06/17/2014) 15 tablet 0    Home: Home Living Family/patient expects to be discharged to:: Unsure Living Arrangements: Children Available Help at Discharge: Family Type of Home: Apartment Home Access: Stairs to enter Entrance Stairs-Number of Steps: 12 Home Layout: Able to live on main level with bedroom/bathroom Home Equipment: None Additional Comments: tub shower, standard height toilets   Functional History: Prior Function Level of Independence: Independent  Functional Status:  Mobility: Bed Mobility   Bed Mobility: Supine to Sit     Supine to sit: HOB elevated;Min assist     General bed mobility comments: pt able to transfer from supine with HOB 30 to sitting EOB with only cues and use of rail but required min assist to scoot hips fully to EOB  Transfers Overall transfer level: Needs assistance   Transfers: Sit to/from Stand Sit to Stand: Min assist         General transfer comment: mod cues for anterior translation, safety   and sequence with left foot blocked x 3 trials  Ambulation/Gait Ambulation/Gait assistance: Min assist;+2 safety/equipment Ambulation Distance (Feet): 82 Feet Assistive device: Rolling walker (2 wheeled) Gait Pattern/deviations: Step-through pattern;Decreased stride length;Trunk flexed;Wide base of support   Gait velocity interpretation: Below normal speed for age/gender General Gait Details: Pt walked 62', 82', 16' with seated rest between each ambulation trial  with one less seated rest needed today. Music to assist with motivating pt and chair to follow for fatigue. Cues for posture, position in Rw and BOS      ADL: ADL Overall ADL's : Needs assistance/impaired Eating/Feeding: NPO Eating/Feeding Details (indicate cue type and reason): reports not feeling well and not eating lunch currently Grooming: Wash/dry hands, Wash/dry face, Set up, Sitting Grooming Details (indicate cue type and reason): min assist to sit EOB while doing simple grooming tasks. Pt stated she was very tired and wanting to lay down. Upper Body Bathing: Minimal assitance, Sitting Upper Body Bathing Details (indicate cue type and reason): encouragement to complete task Lower Body Bathing: Total assistance, Bed level Lower Body Bathing Details (indicate cue type and reason): pt participated minimally Upper Body Dressing : Moderate assistance, Sitting Lower Body Dressing: Total assistance, Bed level Lower Body Dressing Details (indicate cue type and reason): with use of stedy Toilet Transfer: Minimal assistance, +2 for physical assistance, Stand-pivot, BSC Toilet Transfer Details (indicate cue type and reason): Pt required assist to prevent Rt knee from buckling and and assist to advance LEs to pivot  Toileting- Clothing Manipulation and Hygiene: Total assistance Toileting - Clothing Manipulation Details (indicate cue type and reason): pt able to perform pericare in sitting. Max  A to stand with use of stedy Functional mobility during ADLs: Minimal assistance, +2 for physical assistance General ADL Comments: Pt sat EOB ~20 minutes to facilitate trunk control. Pt has good static sitting balance and promoted dynamic balance through reaching to apply lotion to LEs, weight shift without UE support, and trunk rotation. Pt participated in scapular mobilization to promote full ROM and functional UE use.   Cognition: Cognition Overall Cognitive Status: Within Functional Limits for  tasks assessed Orientation Level: Oriented X4 Cognition Arousal/Alertness: Awake/alert Behavior During Therapy: Flat affect Overall Cognitive Status: Within Functional Limits for tasks assessed Area of Impairment:  (mild attention deficit) Current Attention Level: Selective General Comments: Pt able to follow one and two step commands consistently. Cognition appears [facility] for basic tasks   Physical Exam: Blood pressure 100/58, pulse 100, temperature 98.2 F (36.8 C), temperature source Oral, resp. rate 17, height _0  (1.676 m), weight 96 kg (211 lb 10.3 oz), SpO2 100 %. Physical Exam  Gen: alert, no distress, obese HENT:  Poor dentition,  Eyes: EOM are normal.  Neck: Normal range of motion. Neck supple. No thyromegaly present.  Cardiovascular: Normal rate and regular rhythm. no murmurs Respiratory: Effort normal and breath sounds normal. No respiratory distress. occ rhonchi and upper airway sounds. Occasional non-productive cough. GI: Bowel sounds are normal.  Abdominal wound is dressed . G-tube site clean and dry  Neurological: She is alert.  She is oriented to person place date of birth.  Generalized weakness. LUE 3/5 deltoid, bicep, tricep. 3+/5 wrist/hand. RUE grossly 3+ to 4/5.  LE: 3 RHF, 2 LHF, 3+ RKE, 2 LKE, ankles grossly 3+ to 4-/5. Resting tremor in arms/legs less apparent today. Inconsistent sensory exam. Flat, cooperative. Attention, awareness, insight are improving. Psych: pleasant and up beat.   Results for orders placed or performed during the hospital encounter  of 06/17/14 (from the past 48 hour(s))  CBC     Status: Abnormal   Collection Time: 08/15/14 10:45 AM  Result Value Ref Range   WBC 14.4 (H) 4.0 - 10.5 K/uL   RBC 3.36 (L) 3.87 - 5.11 MIL/uL   Hemoglobin 9.2 (L) 12.0 - 15.0 g/dL   HCT 29.6 (L) 36.0 - 46.0 %   MCV 88.1 78.0 - 100.0 fL   MCH 27.4 26.0 - 34.0 pg   MCHC 31.1 30.0 - 36.0 g/dL   RDW 18.9 (H) 11.5 - 15.5 %   Platelets 276 150 - 400 K/uL    Glucose, capillary     Status: Abnormal   Collection Time: 08/15/14 12:18 PM  Result Value Ref Range   Glucose-Capillary 139 (H) 65 - 99 mg/dL   Comment 1 Notify RN    Comment 2 Document in Chart   Blood gas, arterial     Status: Abnormal   Collection Time: 08/15/14  5:03 PM  Result Value Ref Range   FIO2 1.00 %   Delivery systems NONREBREATHER    pH, Arterial 7.341 (L) 7.350 - 7.450   pCO2 arterial 48.4 (H) 35.0 - 45.0 mmHg   pO2, Arterial 69.7 (L) 80.0 - 100.0 mmHg   Bicarbonate 25.5 (H) 20.0 - 24.0 mEq/L   TCO2 27.0 0 - 100 mmol/L   Acid-Base Excess 0.4 0.0 - 2.0 mmol/L   O2 Saturation 93.5 %   Patient temperature 98.6    Collection site RIGHT RADIAL    Drawn by [id number]    Sample type ARTERIAL DRAW    Allens test (pass/fail) PASS PASS  Troponin I (q 6hr x 3)     Status: Abnormal   Collection Time: 08/15/14  6:01 PM  Result Value Ref Range   Troponin I 0.10 (H) <0.031 ng/mL    Comment:        PERSISTENTLY INCREASED TROPONIN VALUES IN THE RANGE OF 0.04-0.49 ng/mL CAN BE SEEN IN:       -UNSTABLE ANGINA       -CONGESTIVE HEART FAILURE       -MYOCARDITIS       -CHEST TRAUMA       -ARRYHTHMIAS       -LATE PRESENTING MYOCARDIAL INFARCTION       -COPD   CLINICAL FOLLOW-UP RECOMMENDED.   Troponin I (q 6hr x 3)     Status: Abnormal   Collection Time: 08/15/14  9:50 PM  Result Value Ref Range   Troponin I 0.14 (H) <0.031 ng/mL    Comment:        PERSISTENTLY INCREASED TROPONIN VALUES IN THE RANGE OF 0.04-0.49 ng/mL CAN BE SEEN IN:       -UNSTABLE ANGINA       -CONGESTIVE HEART FAILURE       -MYOCARDITIS       -CHEST TRAUMA       -ARRYHTHMIAS       -LATE PRESENTING MYOCARDIAL INFARCTION       -COPD   CLINICAL FOLLOW-UP RECOMMENDED.   Glucose, capillary     Status: Abnormal   Collection Time: 08/16/14 12:45 AM  Result Value Ref Range   Glucose-Capillary 153 (H) 65 - 99 mg/dL  Glucose, capillary     Status: None   Collection Time: 08/16/14  6:33 AM  Result Value  Ref Range   Glucose-Capillary 96 65 - 99 mg/dL  Troponin I (q 6hr x 3)     Status: Abnormal   Collection Time: 08/16/14  6:41 AM  Result Value Ref Range   Troponin I 0.10 (H) <0.031 ng/mL    Comment:        PERSISTENTLY INCREASED TROPONIN VALUES IN THE RANGE OF 0.04-0.49 ng/mL CAN BE SEEN IN:       -UNSTABLE ANGINA       -CONGESTIVE HEART FAILURE       -MYOCARDITIS       -CHEST TRAUMA       -ARRYHTHMIAS       -LATE PRESENTING MYOCARDIAL INFARCTION       -COPD   CLINICAL FOLLOW-UP RECOMMENDED.   CBC     Status: Abnormal   Collection Time: 08/16/14  6:41 AM  Result Value Ref Range   WBC 16.0 (H) 4.0 - 10.5 K/uL   RBC 3.12 (L) 3.87 - 5.11 MIL/uL   Hemoglobin 8.6 (L) 12.0 - 15.0 g/dL   HCT 28.2 (L) 36.0 - 46.0 %   MCV 90.4 78.0 - 100.0 fL   MCH 27.6 26.0 - 34.0 pg   MCHC 30.5 30.0 - 36.0 g/dL   RDW 19.4 (H) 11.5 - 15.5 %   Platelets 250 150 - 400 K/uL  Basic metabolic panel     Status: Abnormal   Collection Time: 08/16/14  6:41 AM  Result Value Ref Range   Sodium 132 (L) 135 - 145 mmol/L   Potassium 4.4 3.5 - 5.1 mmol/L    Comment: DELTA CHECK NOTED   Chloride 95 (L) 101 - 111 mmol/L   CO2 28 22 - 32 mmol/L   Glucose, Bld 127 (H) 65 - 99 mg/dL   BUN 45 (H) 6 - 20 mg/dL   Creatinine, Ser 3.59 (H) 0.44 - 1.00 mg/dL   Calcium 7.6 (L) 8.9 - 10.3 mg/dL   GFR calc non Af Amer 13 (L) >60 mL/min   GFR calc Af Amer 15 (L) >60 mL/min    Comment: (NOTE) The eGFR has been calculated using the CKD EPI equation. This calculation has not been validated in all clinical situations. eGFR's persistently <60 mL/min signify possible Chronic Kidney Disease.    Anion gap 9 5 - 15  Magnesium     Status: None   Collection Time: 08/16/14  6:41 AM  Result Value Ref Range   Magnesium 2.2 1.7 - 2.4 mg/dL  Phosphorus     Status: None   Collection Time: 08/16/14  6:41 AM  Result Value Ref Range   Phosphorus 4.0 2.5 - 4.6 mg/dL  Glucose, capillary     Status: Abnormal   Collection Time:  08/16/14  8:32 AM  Result Value Ref Range   Glucose-Capillary 176 (H) 65 - 99 mg/dL  Glucose, capillary     Status: Abnormal   Collection Time: 08/16/14 12:02 PM  Result Value Ref Range   Glucose-Capillary 155 (H) 65 - 99 mg/dL  Glucose, capillary     Status: Abnormal   Collection Time: 08/16/14  6:03 PM  Result Value Ref Range   Glucose-Capillary 120 (H) 65 - 99 mg/dL  Glucose, capillary     Status: Abnormal   Collection Time: 08/17/14 12:38 AM  Result Value Ref Range   Glucose-Capillary 156 (H) 65 - 99 mg/dL  Comprehensive metabolic panel     Status: Abnormal   Collection Time: 08/17/14  4:30 AM  Result Value Ref Range   Sodium 131 (L) 135 - 145 mmol/L   Potassium 4.2 3.5 - 5.1 mmol/L   Chloride 95 (L) 101 - 111 mmol/L   CO2 26  22 - 32 mmol/L   Glucose, Bld 68 65 - 99 mg/dL   BUN 71 (H) 6 - 20 mg/dL   Creatinine, Ser 4.80 (H) 0.44 - 1.00 mg/dL   Calcium 7.9 (L) 8.9 - 10.3 mg/dL   Total Protein 6.3 (L) 6.5 - 8.1 g/dL   Albumin 1.6 (L) 3.5 - 5.0 g/dL   AST 29 15 - 41 U/L   ALT 9 (L) 14 - 54 U/L   Alkaline Phosphatase 162 (H) 38 - 126 U/L   Total Bilirubin 1.1 0.3 - 1.2 mg/dL   GFR calc non Af Amer 9 (L) >60 mL/min   GFR calc Af Amer 11 (L) >60 mL/min    Comment: (NOTE) The eGFR has been calculated using the CKD EPI equation. This calculation has not been validated in all clinical situations. eGFR's persistently <60 mL/min signify possible Chronic Kidney Disease.    Anion gap 10 5 - 15  Magnesium     Status: None   Collection Time: 08/17/14  4:30 AM  Result Value Ref Range   Magnesium 2.3 1.7 - 2.4 mg/dL  Phosphorus     Status: Abnormal   Collection Time: 08/17/14  4:30 AM  Result Value Ref Range   Phosphorus 6.2 (H) 2.5 - 4.6 mg/dL  CBC     Status: Abnormal   Collection Time: 08/17/14  4:30 AM  Result Value Ref Range   WBC 9.8 4.0 - 10.5 K/uL   RBC 4.01 3.87 - 5.11 MIL/uL   Hemoglobin 11.3 (L) 12.0 - 15.0 g/dL    Comment: REPEATED TO VERIFY   HCT 36.3 36.0 -  46.0 %   MCV 90.5 78.0 - 100.0 fL   MCH 28.2 26.0 - 34.0 pg   MCHC 31.1 30.0 - 36.0 g/dL   RDW 19.3 (H) 11.5 - 15.5 %   Platelets 203 150 - 400 K/uL  Differential     Status: Abnormal   Collection Time: 08/17/14  4:30 AM  Result Value Ref Range   Neutrophils Relative % 74 43 - 77 %   Neutro Abs 7.3 1.7 - 7.7 K/uL   Lymphocytes Relative 10 (L) 12 - 46 %   Lymphs Abs 1.0 0.7 - 4.0 K/uL   Monocytes Relative 13 (H) 3 - 12 %   Monocytes Absolute 1.3 (H) 0.1 - 1.0 K/uL   Eosinophils Relative 3 0 - 5 %   Eosinophils Absolute 0.3 0.0 - 0.7 K/uL   Basophils Relative 0 0 - 1 %   Basophils Absolute 0.0 0.0 - 0.1 K/uL  Prealbumin     Status: Abnormal   Collection Time: 08/17/14  4:30 AM  Result Value Ref Range   Prealbumin 7.9 (L) 18 - 38 mg/dL  Triglycerides     Status: None   Collection Time: 08/17/14  4:30 AM  Result Value Ref Range   Triglycerides 142 <150 mg/dL  Glucose, capillary     Status: None   Collection Time: 08/17/14  6:09 AM  Result Value Ref Range   Glucose-Capillary 96 65 - 99 mg/dL   Ct Abdomen Pelvis Wo Contrast  08/15/2014   CLINICAL DATA:  Intra abdominal abscess.  EXAM: CT ABDOMEN AND PELVIS WITHOUT CONTRAST  TECHNIQUE: Multidetector CT imaging of the abdomen and pelvis was performed following the standard protocol without IV contrast.  COMPARISON:  CT scan of Aug 03, 2014.  FINDINGS: Severe degenerative disc disease is noted at L5-S1. Minimal subsegmental atelectasis is noted posteriorly in the left lung base.  No  gallstones are noted. No focal abnormality is noted in the liver, spleen or pancreas on these unenhanced images. Adrenal glands appear normal. Stable exophytic cyst is seen arising from midpole of left kidney. No hydronephrosis or renal obstruction is noted. No renal or ureteral calculi are noted. 3 cm infrarenal abdominal aortic aneurysm is noted. Gastrojejunostomy tube is seen entering stomach percutaneously and with distal tip in the jejunum. JP surgical drain  is seen entering right upper quadrant of abdomen underneath the liver with distal tip in the left upper quadrant. No significant fluid collection is noted around the drain. Fluid collection is again identified and posterior cul-de-sac of pelvis measuring 5.1 x 3.2 cm. Calcified degenerating fibroid is noted in the uterus. Urinary bladder is unremarkable. There is no evidence of bowel obstruction. The appendix appears normal. No significant adenopathy is noted. Large midline surgical anterior abdominal wall incision is noted.  IMPRESSION: Stable 3 cm infrarenal abdominal aortic aneurysm.  No change in position of JP surgical drain with distal tip in the left upper quadrant of the abdomen.  Interval placement of percutaneous gastrojejunostomy tube with distal tip in the jejunum.  Grossly stable size and appearance of 5 cm fluid collection seen in posterior cul-de-sac of the pelvis.  Continued presence uterine fibroids.   Electronically Signed   By: Marijo Conception, M.D.   On: 08/15/2014 15:15   Dg Chest Port 1 View  08/15/2014   CLINICAL DATA:  Shortness of breath.  EXAM: PORTABLE CHEST - 1 VIEW  COMPARISON:  08/13/2014 and multiple prior chest radiographs  FINDINGS: Cardiomegaly and pulmonary vascular congestion again noted.  A right IJ central venous catheter is noted with tips overlying the upper and mid SVC.  A left central venous catheter is present with tip overlying the lower SVC.  There is no evidence of focal airspace disease, pulmonary edema, suspicious pulmonary nodule/mass, pleural effusion, or pneumothorax. No acute bony abnormalities are identified.  IMPRESSION: Cardiomegaly with pulmonary vascular congestion.   Electronically Signed   By: Margarette Canada M.D.   On: 08/15/2014 17:28       Medical Problem List and Plan: 1. 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 status post loop recorder/respiratory failure/multi-medical 2.  DVT Prophylaxis/Anticoagulation: SCD.Monitor for any signs of  DVT. Venous Doppler studies negative 3. Pain Management: Oxycodone as needed. 4. End-stage renal disease. Left arm AV fistula 09/09/2014. Hemodialysis as per renal services 5. Neuropsych: This patient is capable of making decisions on her own behalf. 6. Skin/Wound Care: Routine skin checks/dressing changes as advised to abdominal wound with wet-to-dry 7. Fluids/Electrolytes/Nutrition: Routine nose with follow-up chemistries 8. C. difficile. Patient remains on Flagyl and will discuss duration. 9. Hypertension/intermittent bouts of PSVT. Lopressor 12.5 mg twice a day. 10. Massive pneumoperitoneum and ascites. Status post exploratory laparotomy and closure of perforated pyloric ulcer 07/01/2014. Follow-up Gen. surgery. Dressing changes as advised to abdominal wound 11. Decreased nutritional storage. Advance diet as tolerated. Status post gastrostomy tube 08/12/2014 per interventional radiology 12. Hypothyroidism. Synthroid 13. Mood/anxiety. Ativan as needed    Post Admission Physician Evaluation: 1. Functional deficits secondary  to right ACA infarct. 2. Patient is admitted to receive collaborative, interdisciplinary care between the physiatrist, rehab nursing staff, and therapy team. 3. Patient's level of medical complexity and substantial therapy needs in context of that medical necessity cannot be provided at a lesser intensity of care such as a SNF. 4. Patient has experienced substantial functional loss from his/her baseline which was documented above under the "Functional  History" and "Functional Status" headings.  Judging by the patient's diagnosis, physical exam, and functional history, the patient has potential for functional progress which will result in measurable gains while on inpatient rehab.  These gains will be of substantial and practical use upon discharge  in facilitating mobility and self-care at the household level. 5. Physiatrist will provide 24 hour management of medical needs  as well as oversight of the therapy plan/treatment and provide guidance as appropriate regarding the interaction of the two. 6. 24 hour rehab nursing will assist with bladder management, bowel management, safety, skin/wound care, disease management, medication administration, pain management and patient education  and help integrate therapy concepts, techniques,education, etc. 7. PT will assess and treat for/with: Lower extremity strength, range of motion, stamina, balance, functional mobility, safety, adaptive techniques and equipment, NMR, activity tolerance, family education, community reintegration.   Goals are: mod I to supervision. 8. OT will assess and treat for/with: ADL's, functional mobility, safety, upper extremity strength, adaptive techniques and equipment, NMR, visual-perceptual awareness, ego support, community reintegration.   Goals are: mod I to supervision. Therapy may not yet proceed with showering this patient. 9. SLP will assess and treat for/with: n/a.  Goals are: n/a. 10. Case Management and Social Worker will assess and treat for psychological issues and discharge planning. 11. Team conference will be held weekly to assess progress toward goals and to determine barriers to discharge. 12. Patient will receive at least 3 hours of therapy per day at least 5 days per week. 13. ELOS: 7-11 days       14. Prognosis:  excellent     Meredith Staggers, MD, [facility] 09/11/2014   08/17/2014

## 2014-08-17 NOTE — Sedation Documentation (Signed)
Pt  tolerated procedure well.

## 2014-08-17 NOTE — Progress Notes (Signed)
Daily Progress Note   Patient Name: [name]       Date: 08/17/2014 DOB: 1955-11-13  Age: 59 y.o. MRN#: [id number] Attending Physician: Rhetta MuraJai-Gurmukh Samtani, MD Primary Care Physician: Willey BladeEAN, [name], MD Admit Date: 06/17/2014  Reason for Consultation/Follow-up: Establishing goals of care and Pain control  Subjective: No major c/o except she wants to have ice chips and fluids. Minimal pain issues: usually associated with dressing changes and manipulaton of the abdomen.  Interval Events: Generally pretty stable over the past week. She does have a pelvic abscess with positive cultures. Was for IR placement of drain and for ID consult re: restarting antibiotic that are organism specific. Has continued with PT/OT, has been seen by PM&R - may be a candidate for CIR when medically stable vs LTAC. Wound vac d/c's and now on wet to dry dressing tid. Con't NPO but may be a candidate for starting TF soon. Has continued with HD - by record some hypotensive events. No cardiac events noted.  Length of Stay: 61 days  Current Medications: Scheduled Meds:   antiseptic oral rinse  7 mL Mouth Rinse q12n4p   budesonide (PULMICORT) nebulizer solution  0.25 mg Nebulization BID   chlorhexidine  15 mL Mouth Rinse BID   [START ON 08/18/2014] darbepoetin (ARANESP) injection - DIALYSIS  200 mcg Intravenous Q Tue-HD   ferric gluconate (FERRLECIT/NULECIT) IV  125 mg Intravenous Q T,Th,Sa-HD   insulin aspart  0-9 Units Subcutaneous 4 times per day   levothyroxine  12.5 mcg Intravenous Daily   metoprolol  5 mg Intravenous Q4H   sodium chloride  10-40 mL Intracatheter Q12H    Continuous Infusions:  sodium chloride 10 mL/hr at 08/10/14 2300   .TPN (CLINIMIX-E) Adult 100 mL/hr at 08/16/14 1750   And   fat emulsion 240 mL (08/16/14 1750)   TPN (CLINIMIX) Adult without lytes     And   fat emulsion     feeding supplement (VITAL AF 1.2 CAL) Stopped (08/15/14 1030)   octreotide  (SANDOSTATIN)    IV  infusion 50 mcg/hr (08/16/14 1133)    PRN Meds: bisacodyl, camphor-menthol, diphenhydrAMINE, diphenhydrAMINE-zinc acetate, HYDROmorphone (DILAUDID) injection, levalbuterol, ondansetron (ZOFRAN) IV, sodium chloride  Palliative Performance Scale: 40%     Vital Signs: BP 100/58 mmHg   Pulse 100   Temp(Src) 98.2 F (36.8 C) (Oral)   Resp 17   Ht 5\' 6"  (1.676 m)   Wt 96 kg (211 lb 10.3 oz)   BMI 34.18 kg/m2   SpO2 100% SpO2: SpO2: 100 % O2 Device: O2 Device: Nasal Cannula O2 Flow Rate: O2 Flow Rate (L/min): 2 L/min  Intake/output summary:  Intake/Output Summary (Last 24 hours) at 08/17/14 1127 Last data filed at 08/17/14 0600  Gross per 24 hour  Intake   2270 ml  Output    510 ml  Net   1760 ml   LBM:   Baseline Weight: Weight: 103.3 kg (227 lb 11.8 oz) Most recent weight: Weight: 96 kg (211 lb 10.3 oz)  Physical Exam: Genl - overweight black woman in no acute distress HEENT- C&S clear Cor - quiet precordium, RRR Pul - normal respirations, no wheezing, no rales Abd - dressing in place. Tender to palpation. Neuor - awake and oriented               Additional Data Reviewed: Recent Labs     08/16/14  0641  08/17/14  0430  WBC  16.0*  9.8  HGB  8.6*  11.3*  PLT  250  203  NA  132*  131*  BUN  45*  71*  CREATININE  3.59*  4.80*   CMP Latest Ref Rng 08/17/2014 08/16/2014 08/15/2014  Glucose 65 - 99 mg/dL 68 191(Y127(H) 782(N128(H)  BUN 6 - 20 mg/dL 56(O71(H) 13(Y45(H) 86(V66(H)  Creatinine 0.44 - 1.00 mg/dL 7.84(O4.80(H) 9.62(X3.59(H) 5.28(U4.23(H)  Sodium 135 - 145 mmol/L 131(L) 132(L) 131(L)  Potassium 3.5 - 5.1 mmol/L 4.2 4.4 3.0(L)  Chloride 101 - 111 mmol/L 95(L) 95(L) 98(L)  CO2 22 - 32 mmol/L 26 28 23   Calcium 8.9 - 10.3 mg/dL 7.9(L) 7.6(L) 7.4(L)  Total Protein 6.5 - 8.1 g/dL 6.3(L) - -  Total Bilirubin 0.3 - 1.2 mg/dL 1.1 - -  Alkaline Phos 38 - 126 U/L 162(H) - -  AST 15 - 41 U/L 29 - -  ALT 14 - 54 U/L 9(L) - -    CT ABD/PELV 08/15/14: IMPRESSION: Stable 3 cm infrarenal abdominal aortic  aneurysm.  No change in position of JP surgical drain with distal tip in the left upper quadrant of the abdomen.  Interval placement of percutaneous gastrojejunostomy tube with distal tip in the jejunum.  Grossly stable size and appearance of 5 cm fluid collection seen in posterior cul-de-sac of the pelvis.  Continued presence uterine fibroids.  CXR 08/15/14: IMPRESSION: Cardiomegaly with pulmonary vascular congestion.  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   Fistula    History of intermediate V/Q scan    Other emphysema    Intra-abdominal abscess    Abnormal TSH    Enteritis due to Clostridium difficile 07/19/2014   Abdominal abscess    Pelvic fluid collection    Perforated gastric ulcer 07/01/2014   SOB (shortness of breath)    PJRT (permanent junctional reciprocating tachycardia)    Paroxysmal atrial tachycardia    PSVT (paroxysmal supraventricular tachycardia)    Stroke    Hyperlipidemia    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 May need LTAC vs CIR when medically more stable.  Desire for further Chaplaincy support:no  3. Symptom Management:  Patient with occasional pain. Med record: 1 does of IV dilaudid in the last 24 hrs.   4. Palliative Prophylaxis:  Stool Softner: no issue  5. Prognosis: Unable to determine  5. Discharge Planning: Skilled Nursing Facility for rehab with Palliative care service follow-up   Care plan was discussed with patient  Thank you for allowing the Palliative Medicine Team to assist in the care of this patient.   Time In: 1127  Time Out: 1152 Total Time 25 min   no     Greater than 50%  of this time was spent counseling and coordinating care related to the above assessment and plan.   Illene Regulus, MD Palliative Care team   08/17/2014, 11:27 AM  Please contact Palliative Medicine Team phone at [phone] for questions and concerns.

## 2014-08-17 NOTE — Progress Notes (Signed)
To Interventional Radiology.

## 2014-08-17 NOTE — Progress Notes (Signed)
[name] QMV:[id number] DOB: February 23, 1956 DOA: 06/17/2014 PCP: [name], MD  Admit HPI / Brief Narrative: 59 yo ? 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 Page, failed L AV fistula, initially admitted 3/16 with flu and acute hypoxic resp failure. Ultimately tx to [facility] due to need for HD L sided weakness 3/18 and found to have R ACA stroke-showed acute infarct R ACA  Patient ? lethargic 3/30 and CT abd=perf viscus 3/31 was taken to the OR for ex lap for perforated pyloric ulcer with diffuse peritonitis, ischemic necrosis of mid ileum. Patient developed septic shock on 4-1, she was started on pressors and wean off pressors on 4-3.  Subsequently she was diagnosed with pelvic abscess by CT scan perform on 4-7. She underwent pelvic drain placement by IR.  C. diff diagnosed 4-12-not responding flagyl, and she was started on vancomycin 4-16.  develops  run of SVT. Cardiology has been helping with management.  Course complicated with high RV pressure ,CT angio negative for PE. - Also with high output fistula formation between anastomosis and drain, making her back to NPO state and in need of TPN. FULL CODE Palliative care consulted On 5-2 = hypoxemia, hypotension and SVT during dialysis treatment. Dialysis was stop. She received IV bolus. She was started on BIPAP. She was transfer to Step down unit. CCM and cardiology re consulted.Her Hr decreased, BP improved. She was taken off BIPAP. This recurred on 5/15 with episodic SVT Now stable, on TNA, off all Abx, continues to have very high output from fistula/leak per [facility] Had a 17f Coax DL G_J tube placed,c tip @ lig Treitz by Dr. [name] of IR and Gen surgery has contuinued to assist with management of her multiple surgical comorbidities CT repeat 5/14 showed 5 cm abscess-Gen surg and IR to discuss plan of care going forward in terms of further drainage  Polyconsultant roles greatly appreciated     HPI/Subjective:  In pain 8/10 Just medicated No new other issues Note wound results showed Pseudomonas No cp No sob BP low as usaul without any symptoms from patuient  Assessment/Plan: Acute hypoxic Respiratory Failure:  -2nd to H1N1, hypervolemia, HCAP >> resolved.  -also has COPD and PAH -improving, now weaning off O2 -Nebulizer PRN and CPAP QHS   Septic shock  -due to perforated prepyloric ulcer, required pressors -resolved currently  C. difficile colitis still on contact precautions -C diff positive on 4-12, Received 4 days of flagyl without improvement, then started on PO vanc -Completed 3 weeks of Oral Vanc-stopped 5/7, this was 1 week after IV Zosyn was stopped 5/5 -Diarrhea resolved  Status post exploratory laparotomy with closure of perforated pyloric ulcer/abdominal abscess / small bowel resection w/ postop ileus and now with fistula  -Pelvic abscess s/p percutaneous drain placement 4/8; had leakage from anastomosis site ? received Zosyn for 23 days.  -chronic low grade leukocytosis range 13-15 -IV antibiotics discontinued by Surgery on 4/22, also Received 18 days of Vancomycin and fluconazole.  -Due to Fistula is back to NPO state, TPN.  -Fistula study done shows JP drain connection to the duodenal bulb-5-02. - repeat CT abdomen with  3.0 x 3.5 x 5.7 cm collection in the pelvic cul-de-sac and extraluminal air alongside JP drain in anterior abd wall -Drain with old and new blood, Hb trending down, transfuse 2 unit PRBC 5/10 -continues to have high output from Fistula/leak ?-Had 42f Coax DL G_J placed, tip lig Treitz placed  by IR 08/12/14 at request of gen surgery -d/w Dr [name] 5/12 whose expertise is greatly appreciated-Will consider ? D/c Octreotide and in the next 7-10 days determine what to do re: J tube feeds with G-tube portion to drain -CT repeat shows 5 cm post cul-de-sac abscess which was drained on 5/16-culture pending  Hypokalemia;  -replace in  TNA Current order as per RD/PharmD onn 5/16   PSVT/concerns for pulmonary embolism given extremely high heart right side pressure Multiple episoded 5/2 and 5/15 -Has been evaluated by EP, no further episodes for 48h -Cardiology following peripherally- follow K+ and Mg  -Continue IV metoprolol, now off amiodarone;  -V-Q scan with intermediate probability for pulmonary embolism.  -CTA 4/27, negative for PE   -she is hypotensive 2/2 to metoprolol IV--We will ask cardiology to revisit  Small PFO Has been evaluated by Cardiology  ACA CVA with Lt sided weakness -TEE ;normal LV function; no LAA thrombus -Started on ASA 81mg  per NEuro, now NPO -Will need ongoing long term rehab - PT/OT to cont to follow while inpatient -Patient remains very weak and deconditioned .   ESRD new start HD this admit coml;icated by intermittent hypotension AKi on CKD 4, due to sepsis, shock Ongoing hemodialysis per Nephrology - perm-cath placed 3/26 d/t AVF problems Patient AV fistula reviewed and not mature yet per Dr. Paulene FloorFields-will revisit prior to d/c -she was unable to complete dialysis treatment 5-2 due to hypotension, SVT, -has had some ongoing hypotension-EDW may need to be adjusted as per Nephrology  Chronic diastolic congestive heart failure w/ Severe RHF EF 60-65%, grade 1 diastolic dysfunction. Fluids/volume managed with hemodialysis.  Elevated PA pressures on 2-D echo; CTA negative for PE  Anemia of critical illness/chronic disease -and bleeding from leak/fistula -s/p 2U PRBC 4/13  Transfuse 2 u PRBC 5/10 Aranesp/iron per renal -hb stable 8-9 range  Thrombocytopenia; resolved.  Appears to be associated with sepsis Has had mild positive HIT test during this admission  SCDs for DVT proph  Asthma/COPD Continue oxygen supplementation as needed Will continue pulmicort Continue xopenex CXR 1 view 5/12 non-acute  Diabetes mellitus 2 Hypoglycemia in setting of NPO status. On TPN now, CBGs   stabilizedin 120-160s Hbaic 6.5  Abnormal TSH -Free T4 WNL, T3 1.8 -most likely sick thyroid with ongoing infection and body stress; also due to amiodarone use. TSH in 7 range -continue low dose synthroid given PSVT, now on IV synthroid   L Renal mass  -noted on US, Indeterminate 1.9 cm hypoechoic mass off the interpolar region of the left kidney -may potentially represent a cyst however solid mass is not excluded -needs  Outpatient FU of this  Obesity - Inadequate oral intake related to altered GI function as evidenced by limited intake, ongoing.  Body mass index is 34.18 kg/(m^2). -patient weight trending down -patient unable to tolerate diet and with leaking fistula  Code Status: FULL Family Communication: no family + Disposition Plan: unclear-maybe CIR soon  Consultants: Cardiology Nephrology PCCM Gen Surgery  Palliative care  Significant Events: 3/16 influenza A+ 3/18 R ACA stroke 3/19 MRI multifocal acute infarction of both hemispheres, large right distal ACA 3/22 TEE severe RAE, severely reduced RV function, severe TR, no LAA thrombus, small PFO on TEE 3/31 to OR ex lap for per pyloric ulcer and sm bowel resection 4/1 septic shock on pressors 4/3 pressors weaned off  4/4 restart pressors, CRRT 4/6 levo switched to neo 4/7 pelvic abscess on CT 4/8 IR placed pelvic drain 4/9 off pressors  4/11 long run of SVT. 4/23 VQ scan which demonstrated intermediate probability for PE 4/24 CT angiogram of her chest: Pending/unable to be done due to lack of access   Antibiotics: Zosyn >stopped on 4/22 (received a total of 23 days of treatment ) Vancomycin 4-16  DVT prophylaxis: SCDs  Objective: Blood pressure 132/77, pulse 96, temperature 98.2 F (36.8 C), temperature source Oral, resp. rate 22, height 5\' 6"  (1.676 m), weight 96 kg (211 lb 10.3 oz), SpO2 93 %.  Intake/Output Summary (Last 24 hours) at 08/17/14 1831 Last data filed at 08/17/14 1619  Gross per 24 hour   Intake   1720 ml  Output    480 ml  Net   1240 ml   Exam: General: AAOx3, tired coherent Lungs: ? AE, HD catheter Cardiovascular: Regular rate, no murmurs and no gallops, sinus tachycardia  Abdomen: Abdominal wound dressing noted-wounds not examined today  Data Reviewed: Basic Metabolic Panel:  Recent Labs Lab 08/14/14 0615 08/14/14 1029 08/15/14 0430 08/16/14 0641 08/17/14 0430  NA 128* 134* 131* 132* 131*  K 4.8 3.7 3.0* 4.4 4.2  CL 93* 98* 98* 95* 95*  CO2 26 26 23 28 26   GLUCOSE 649* 125* 128* 127* 68  BUN 43* 50* 66* 45* 71*  CREATININE 3.33* 3.59* 4.23* 3.59* 4.80*  CALCIUM 8.0* 8.2* 7.4* 7.6* 7.9*  MG 2.7* 2.5* 2.3 2.2 2.3  PHOS 6.5* 4.8* 3.8 4.0 6.2*    Liver Function Tests:  Recent Labs Lab 08/11/14 0415 08/12/14 0406 08/13/14 0322 08/14/14 0615 08/17/14 0430  AST  --   --  23 21 29   ALT  --   --  14 10* 9*  ALKPHOS  --   --  170* 157* 162*  BILITOT  --   --  0.8 1.2 1.1  PROT  --   --  6.9 6.2* 6.3*  ALBUMIN 1.6* 1.7* 1.7* 1.5* 1.6*   CBC:  Recent Labs Lab 08/13/14 0322 08/14/14 0615 08/15/14 1045 08/16/14 0641 08/17/14 0430  WBC 15.0* 15.4* 14.4* 16.0* 9.8  NEUTROABS  --   --   --   --  7.3  HGB 9.1* 8.3* 9.2* 8.6* 11.3*  HCT 29.3* 27.7* 29.6* 28.2* 36.3  MCV 88.3 91.1 88.1 90.4 90.5  PLT 288 223 276 250 203    CBG:  Recent Labs Lab 08/16/14 1803 08/17/14 0038 08/17/14 0609 08/17/14 1159 08/17/14 1544  GLUCAP 120* 156* 96 162* 112*    Scheduled Meds:  Scheduled Meds: . antiseptic oral rinse  7 mL Mouth Rinse q12n4p  . budesonide (PULMICORT) nebulizer solution  0.25 mg Nebulization BID  . chlorhexidine  15 mL Mouth Rinse BID  . [START ON 08/18/2014] darbepoetin (ARANESP) injection - DIALYSIS  200 mcg Intravenous Q Tue-HD  . fentaNYL      . ferric gluconate (FERRLECIT/NULECIT) IV  125 mg Intravenous Q T,Th,Sa-HD  . insulin aspart  0-9 Units Subcutaneous 4 times per day  . levothyroxine  12.5 mcg Intravenous Daily  .  lidocaine      . metoprolol  5 mg Intravenous Q4H  . sodium chloride  10-40 mL Intracatheter Q12H    Time spent on care of this patient: 15 minutes   Pleas KochJai Tate Zagal, MD [facility] [phone]) [phone]   If 7PM-7AM, please contact night-coverage [email or web address] Password [email or web address] 08/17/2014, 6:31 PM   LOS: 61 days

## 2014-08-17 NOTE — Consult Note (Signed)
Physical Medicine and Rehabilitation Consult Reason for Consult: Acute infarct right anterior cerebral artery territory/multi-medical Referring Physician: Triad   HPI: [name] is a 59 y.o. right handed female with history of stage V chronic kidney disease status post fistula placement followed by nephrologist at [facility] [name], hypertension, tobacco abuse. Independent prior to admission living with her daughter. Presented 06/17/2014 to [facility] with progressive shortness of breath 2 weeks and lower extremity edema. By report patient has stopped taking her diuretics 2 weeks ago. Findings of elevated creatinine 5.5 baseline of 2.64. Also noted poor appetite related to symptoms of upper respiratory infection. Chest x-ray showed no infiltrate or failure. Renal ultrasound with no hydronephrosis. 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 Nephrology service follow-up placed on intravenous Lasix. Neurology consulted for left sided weakness. MRI of the brain 06/20/2014 shows multifocal areas of acute infarct throughout both hemispheres with the largest involving the right medial parasagittal frontal and posterior frontal cortex. MRA with no large vessel occlusion. Carotid Dopplers with no ICA stenosis. Echocardiogram with ejection fraction 65% and grade 1 diastolic dysfunction. Patient placed on full dose aspirin therapy. TEE performed showing normal LV function and no thrombus. Positive bubble study indicating a small PFO. Lower extremity Dopplers negative for DVT. Underwent placement of loop recorder 06/25/2014 per Dr. Graciela Husbands. Patient with intermittent runs of PSVT placed on beta blocker. Renal function without change the last dialysis was initiated with right internal jugular DIATEK catheter placed 06/27/2014. Developed acute abdominal pain 07/01/2014. CT scan showed massive  pneumoperitoneum and ascites and also pneumatosis of a short segment of the ileum. General surgery consulted underwent exploratory laparotomy evacuation of ascites with closure of perforated pyloric ulcer with omental patch and segmental resection of ileum 07/01/2014 per Dr. Derrell Lolling. Patient required full ventilatory support followed by critical care medicine. Wound care nurse consulted for need for VAC to abdominal wound. Placed on contact precautions for Clostridium difficile. Patient is currently nothing by mouth with nasogastric tube feeds as well as TPN. Therapies initiated slow overall progress. M.D. has requested physical medicine rehabilitation consult.   Review of Systems  Respiratory: Positive for shortness of breath.   Cardiovascular: Positive for leg swelling.  Gastrointestinal: Positive for constipation.  Musculoskeletal: Positive for myalgias.  Psychiatric/Behavioral: The patient has insomnia.   All other systems reviewed and are negative.  Past Medical History  Diagnosis Date  . Asthma   . Hypertension   . Gout   . Arthritis   . Insomnia   . Chronic kidney disease   . Depression   . Renal insufficiency   . Aortic aneurysm without rupture     3cm by CT 08/03/14  . Paroxysmal SVT (supraventricular tachycardia)   . Cor pulmonale   . Stroke 06/2014  . PFO (patent foramen ovale)   . COPD (chronic obstructive pulmonary disease)    Past Surgical History  Procedure Laterality Date  . Multiple tooth extractions    . Av fistula placement Left 01/28/2014    Procedure: ARTERIOVENOUS (AV) FISTULA CREATION;  Surgeon: Sherren Kerns, MD;  Location: [facility] OR;  Service: Vascular;  Laterality: Left;  . Tee without cardioversion N/A 06/23/2014    Procedure: TRANSESOPHAGEAL ECHOCARDIOGRAM (TEE);  Surgeon: Lewayne Bunting, MD;  Location: [facility] ENDOSCOPY;  Service: Cardiovascular;  Laterality: N/A;  . Insertion of dialysis catheter Right 06/27/2014    Procedure: INSERTION OF Right Internal  Jugular DIATEK  CATHETER;  Surgeon: Pryor Ochoa, MD;  Location: [facility] OR;  Service: Vascular;  Laterality: Right;  . Laparotomy N/A 07/01/2014    Procedure: EXPLORATORY LAPAROTOMY WITH CLOSURE OF PERFORATED PYLORIC ULCER;  Surgeon: Claud Kelp, MD;  Location: [facility] OR;  Service: General;  Laterality: N/A;  . Bowel resection N/A 07/01/2014    Procedure: SMALL BOWEL RESECTION;  Surgeon: Claud Kelp, MD;  Location: [facility] OR;  Service: General;  Laterality: N/A;   Family History  Problem Relation Age of Onset  . Diabetes Mother   . Hypertension Mother   . Heart disease Mother   . Heart attack Mother   . Peripheral vascular disease Mother   . Diabetes Father   . Heart disease Father   . Hypertension Father   . Diabetes Sister   . Hypertension Sister   . Heart disease Sister     before age 44  . Heart attack Sister   . Peripheral vascular disease Sister   . Heart disease Brother   . Hyperlipidemia Daughter    Social History:  reports that she has been smoking Cigarettes.  She has a 10 pack-year smoking history. She has never used smokeless tobacco. She reports that she drinks alcohol. She reports that she does not use illicit drugs. Allergies: No Known Allergies Medications Prior to Admission  Medication Sig Dispense Refill  . allopurinol (ZYLOPRIM) 100 MG tablet Take 100 mg by mouth 2 (two) times daily as needed (for gout).    . citalopram (CELEXA) 20 MG tablet Take 20 mg by mouth daily.    . furosemide (LASIX) 80 MG tablet Take 80 mg by mouth daily.    . hydrOXYzine (ATARAX/VISTARIL) 25 MG tablet Take 25 mg by mouth 3 (three) times daily as needed for anxiety.    Marland Kitchen oxyCODONE (ROXICODONE) 5 MG immediate release tablet Take 1 tablet (5 mg total) by mouth every 6 (six) hours as needed for severe pain. (Patient not taking: Reported on 06/17/2014) 15 tablet 0    Home: Home Living Family/patient expects to be discharged to:: Unsure Living Arrangements: Children Available Help at Discharge:  Family Type of Home: Apartment Home Access: Stairs to enter Secretary/administrator of Steps: 12 Home Layout: Able to live on main level with bedroom/bathroom Home Equipment: None Additional Comments: tub shower, standard height toilets  Functional History: Prior Function Level of Independence: Independent Functional Status:  Mobility: Bed Mobility Overal bed mobility: Needs Assistance Bed Mobility: Sit to Supine, Rolling, Sidelying to Sit Rolling: Min assist Sidelying to sit: Min assist Supine to sit: Mod assist Sit to supine: Mod assist Sit to sidelying: +2 for physical assistance, Max assist General bed mobility comments: Min A to roll to Right side and elevate trunk off bed. Verbal encouragement to complete. Pt required mod A to return to supine.  Transfers Overall transfer level: Needs assistance Equipment used: Rolling walker (2 wheeled) Transfers: Sit to/from Stand Sit to Stand: Mod assist, From elevated surface (min +2) Stand pivot transfers: Mod assist, +2 physical assistance, +2 safety/equipment Squat pivot transfers: Mod assist, +2 safety/equipment General transfer comment: not addressed due to fatigue Ambulation/Gait Ambulation/Gait assistance: Max assist, +2 safety/equipment Ambulation Distance (Feet): 2 Feet (forward and back ) Assistive device: Rolling walker (2 wheeled) General Gait Details: significant assist for w/shift and stability, help control advance of the L LE/ gait hemiparetic in nature Gait Pattern/deviations: Step-to pattern    ADL: ADL Overall ADL's : Needs assistance/impaired Eating/Feeding: NPO Eating/Feeding Details (indicate cue type and reason): reports not  feeling well and not eating lunch currently Grooming: Wash/dry hands, Wash/dry face, Set up, Sitting Grooming Details (indicate cue type and reason): min assist to sit EOB while doing simple grooming tasks. Pt stated she was very tired and wanting to lay down. Upper Body Bathing: Minimal  assitance, Sitting Upper Body Bathing Details (indicate cue type and reason): encouragement to complete task Lower Body Bathing: Total assistance, Bed level Lower Body Bathing Details (indicate cue type and reason): pt participated minimally Upper Body Dressing : Moderate assistance, Sitting Lower Body Dressing: Total assistance, Bed level Lower Body Dressing Details (indicate cue type and reason): with use of stedy Toilet Transfer: Minimal assistance, +2 for physical assistance, Stand-pivot, BSC Toilet Transfer Details (indicate cue type and reason): Pt required assist to prevent Rt knee from buckling and and assist to advance LEs to pivot  Toileting- Clothing Manipulation and Hygiene: Total assistance Toileting - Clothing Manipulation Details (indicate cue type and reason): pt able to perform pericare in sitting. Max  A to stand with use of stedy Functional mobility during ADLs: Minimal assistance, +2 for physical assistance General ADL Comments: Pt sat EOB ~20 minutes to facilitate trunk control. Pt has good static sitting balance and promoted dynamic balance through reaching to apply lotion to LEs, weight shift without UE support, and trunk rotation. Pt participated in scapular mobilization to promote full ROM and functional UE use.   Cognition: Cognition Overall Cognitive Status: Within Functional Limits for tasks assessed Orientation Level: Oriented X4 Cognition Arousal/Alertness: Awake/alert Behavior During Therapy: Flat affect Overall Cognitive Status: Within Functional Limits for tasks assessed Area of Impairment:  (mild attention deficit) Current Attention Level: Selective General Comments: Pt able to follow one and two step commands consistently. Cognition appears [facility]FL for basic tasks   Blood pressure 92/59, pulse 101, temperature 97.9 F (36.6 C), temperature source Oral, resp. rate 16, height 5\' 6"  (1.676 m), weight 96 kg (211 lb 10.3 oz), SpO2 100 %. Physical Exam  HENT:    Poor dentition,   Eyes: EOM are normal.  Neck: Normal range of motion. Neck supple. No thyromegaly present.  Cardiovascular: Normal rate and regular rhythm.   Respiratory: Effort normal and breath sounds normal. No respiratory distress.  GI: Bowel sounds are normal.  Abdominal wound is dressed  Neurological: She is alert.  She is oriented to person place date of birth. Fair awareness of deficits. Generalized weakness. 2-/5 deltoid, bicep, tricep. 3-/5 wrist/hand. LE: 1+ hf, 2-ke, 4-/5. Resting tremor in arms/legs. Inconsistent sensory exam. Very distracted.   Psychiatric:  Flat, cooperative. Poor attention, awareness, insight.     Results for orders placed or performed during the hospital encounter of 06/17/14 (from the past 24 hour(s))  Glucose, capillary     Status: None   Collection Time: 08/16/14  6:33 AM  Result Value Ref Range   Glucose-Capillary 96 65 - 99 mg/dL  Troponin I (q 6hr x 3)     Status: Abnormal   Collection Time: 08/16/14  6:41 AM  Result Value Ref Range   Troponin I 0.10 (H) <0.031 ng/mL  CBC     Status: Abnormal   Collection Time: 08/16/14  6:41 AM  Result Value Ref Range   WBC 16.0 (H) 4.0 - 10.5 K/uL   RBC 3.12 (L) 3.87 - 5.11 MIL/uL   Hemoglobin 8.6 (L) 12.0 - 15.0 g/dL   HCT 01.028.2 (L) 27.236.0 - 53.646.0 %   MCV 90.4 78.0 - 100.0 fL   MCH 27.6 26.0 - 34.0 pg  MCHC 30.5 30.0 - 36.0 g/dL   RDW 16.1 (H) 09.6 - 04.5 %   Platelets 250 150 - 400 K/uL  Basic metabolic panel     Status: Abnormal   Collection Time: 08/16/14  6:41 AM  Result Value Ref Range   Sodium 132 (L) 135 - 145 mmol/L   Potassium 4.4 3.5 - 5.1 mmol/L   Chloride 95 (L) 101 - 111 mmol/L   CO2 28 22 - 32 mmol/L   Glucose, Bld 127 (H) 65 - 99 mg/dL   BUN 45 (H) 6 - 20 mg/dL   Creatinine, Ser 4.09 (H) 0.44 - 1.00 mg/dL   Calcium 7.6 (L) 8.9 - 10.3 mg/dL   GFR calc non Af Amer 13 (L) >60 mL/min   GFR calc Af Amer 15 (L) >60 mL/min   Anion gap 9 5 - 15  Magnesium     Status: None   Collection  Time: 08/16/14  6:41 AM  Result Value Ref Range   Magnesium 2.2 1.7 - 2.4 mg/dL  Phosphorus     Status: None   Collection Time: 08/16/14  6:41 AM  Result Value Ref Range   Phosphorus 4.0 2.5 - 4.6 mg/dL  Glucose, capillary     Status: Abnormal   Collection Time: 08/16/14  8:32 AM  Result Value Ref Range   Glucose-Capillary 176 (H) 65 - 99 mg/dL  Glucose, capillary     Status: Abnormal   Collection Time: 08/16/14 12:02 PM  Result Value Ref Range   Glucose-Capillary 155 (H) 65 - 99 mg/dL  Glucose, capillary     Status: Abnormal   Collection Time: 08/16/14  6:03 PM  Result Value Ref Range   Glucose-Capillary 120 (H) 65 - 99 mg/dL  Glucose, capillary     Status: Abnormal   Collection Time: 08/17/14 12:38 AM  Result Value Ref Range   Glucose-Capillary 156 (H) 65 - 99 mg/dL   Ct Abdomen Pelvis Wo Contrast  08/15/2014   CLINICAL DATA:  Intra abdominal abscess.  EXAM: CT ABDOMEN AND PELVIS WITHOUT CONTRAST  TECHNIQUE: Multidetector CT imaging of the abdomen and pelvis was performed following the standard protocol without IV contrast.  COMPARISON:  CT scan of Aug 03, 2014.  FINDINGS: Severe degenerative disc disease is noted at L5-S1. Minimal subsegmental atelectasis is noted posteriorly in the left lung base.  No gallstones are noted. No focal abnormality is noted in the liver, spleen or pancreas on these unenhanced images. Adrenal glands appear normal. Stable exophytic cyst is seen arising from midpole of left kidney. No hydronephrosis or renal obstruction is noted. No renal or ureteral calculi are noted. 3 cm infrarenal abdominal aortic aneurysm is noted. Gastrojejunostomy tube is seen entering stomach percutaneously and with distal tip in the jejunum. JP surgical drain is seen entering right upper quadrant of abdomen underneath the liver with distal tip in the left upper quadrant. No significant fluid collection is noted around the drain. Fluid collection is again identified and posterior  cul-de-sac of pelvis measuring 5.1 x 3.2 cm. Calcified degenerating fibroid is noted in the uterus. Urinary bladder is unremarkable. There is no evidence of bowel obstruction. The appendix appears normal. No significant adenopathy is noted. Large midline surgical anterior abdominal wall incision is noted.  IMPRESSION: Stable 3 cm infrarenal abdominal aortic aneurysm.  No change in position of JP surgical drain with distal tip in the left upper quadrant of the abdomen.  Interval placement of percutaneous gastrojejunostomy tube with distal tip in the jejunum.  Grossly stable size and appearance of 5 cm fluid collection seen in posterior cul-de-sac of the pelvis.  Continued presence uterine fibroids.   Electronically Signed   By: Lupita RaiderJames  Green Jr, M.D.   On: 08/15/2014 15:15   Dg Chest Port 1 View  08/15/2014   CLINICAL DATA:  Shortness of breath.  EXAM: PORTABLE CHEST - 1 VIEW  COMPARISON:  08/13/2014 and multiple prior chest radiographs  FINDINGS: Cardiomegaly and pulmonary vascular congestion again noted.  A right IJ central venous catheter is noted with tips overlying the upper and mid SVC.  A left central venous catheter is present with tip overlying the lower SVC.  There is no evidence of focal airspace disease, pulmonary edema, suspicious pulmonary nodule/mass, pleural effusion, or pneumothorax. No acute bony abnormalities are identified.  IMPRESSION: Cardiomegaly with pulmonary vascular congestion.   Electronically Signed   By: Harmon PierJeffrey  Hu M.D.   On: 08/15/2014 17:28    Assessment/Plan: Diagnosis: Right ACA infarct 1. Does the need for close, 24 hr/day medical supervision in concert with the patient's rehab needs make it unreasonable for this patient to be served in a less intensive setting? Yes 2. Co-Morbidities requiring supervision/potential complications: ESRD, cCHF, htn, obesity 3. Due to bladder management, bowel management, safety, skin/wound care, disease management, medication administration,  pain management and patient education, does the patient require 24 hr/day rehab nursing? Yes 4. Does the patient require coordinated care of a physician, rehab nurse, PT (1-2 hrs/day, 5 days/week), OT (1-2 hrs/day, 5 days/week) and SLP (1-2 hrs/day, 5 days/week) to address physical and functional deficits in the context of the above medical diagnosis(es)? Yes Addressing deficits in the following areas: balance, endurance, locomotion, strength, transferring, bowel/bladder control, bathing, dressing, feeding, grooming, toileting, cognition, speech and psychosocial support 5. Can the patient actively participate in an intensive therapy program of at least 3 hrs of therapy per day at least 5 days per week? Yes 6. The potential for patient to make measurable gains while on inpatient rehab is good 7. Anticipated functional outcomes upon discharge from inpatient rehab are supervision and min assist  with PT, supervision and min assist with OT, modified independent and supervision with SLP. 8. Estimated rehab length of stay to reach the above functional goals is: 18-22 days 9. Does the patient have adequate social supports and living environment to accommodate these discharge functional goals? Yes and Potentially 10. Anticipated D/C setting: Home 11. Anticipated post D/C treatments: HH therapy 12. Overall Rehab/Functional Prognosis: excellent  RECOMMENDATIONS: This patient's condition is appropriate for continued rehabilitative care in the following setting: CIR Patient has agreed to participate in recommended program. Yes Note that insurance prior authorization may be required for reimbursement for recommended care.  Comment: Rehab Admissions Coordinator to follow up.  Thanks,  Ranelle OysterZachary T. Idriss Quackenbush, MD, [place] DomFAAPMR     08/17/2014

## 2014-08-17 NOTE — Procedures (Signed)
CT guided drain placement in pelvic abscess collection.  Right transgluteal drain placed and 5 ml of yellow purulent fluid removed.  No immediate complication and minimal blood loss.

## 2014-08-17 NOTE — Progress Notes (Signed)
PARENTERAL NUTRITION CONSULT NOTE  Pharmacy Consult for TPN Indication:  High output Duodenal Fistula  No Known Allergies  Patient Measurements: Height: 5\' 6"  (167.6 cm) Weight: 211 lb 10.3 oz (96 kg) IBW/kg (Calculated) : 59.3 Adjusted Body Weight: 71.2kg  Vital Signs: Temp: 98.2 F (36.8 C) (05/16 0755) Temp Source: Oral (05/16 0755) BP: 100/58 mmHg (05/16 0755) Pulse Rate: 100 (05/16 0755) Intake/Output from previous day: 05/15 0701 - 05/16 0700 In: 2810 [I.V.:500; TPN:2310] Out: 635 [Drains:635]  Labs:  Recent Labs  08/15/14 1045 08/16/14 0641 08/17/14 0430  WBC 14.4* 16.0* 9.8  HGB 9.2* 8.6* 11.3*  HCT 29.6* 28.2* 36.3  PLT 276 250 203    Recent Labs  08/15/14 0430 08/16/14 0641 08/17/14 0430  NA 131* 132* 131*  K 3.0* 4.4 4.2  CL 98* 95* 95*  CO2 23 28 26   GLUCOSE 128* 127* 68  BUN 66* 45* 71*  CREATININE 4.23* 3.59* 4.80*  CALCIUM 7.4* 7.6* 7.9*  MG 2.3 2.2 2.3  PHOS 3.8 4.0 6.2*  PROT  --   --  6.3*  ALBUMIN  --   --  1.6*  AST  --   --  29  ALT  --   --  9*  ALKPHOS  --   --  162*  BILITOT  --   --  1.1  PREALBUMIN  --   --  7.9*  TRIG  --   --  142   Estimated Creatinine Clearance: 14.9 mL/min (by C-G formula based on Cr of 4.8).   Recent Labs  08/16/14 1803 08/17/14 0038 08/17/14 0609  GLUCAP 120* 156* 96   Insulin Requirements in the past 12 hours:  4 unit Novolog SSI, no insulin in TPN  Current Nutrition:  Clinimix E 5/15 at 14ml/hr and 20% IVFE at 22ml/hr which will provide 2184 kcal and 120 gm protein.  Nutritional Goals: (per RD note 5/12) 2100-2300 kCal, 120-140 grams of protein per day Goal: Clinimix 5/15 at 186ml/hr + 20% IVFE 29ml/hr to provide 2184 kcal and 120 gm protein  Assessment: 44 YOF with complicated hospital course. S/p closure perforated pyloric ulcer, SBR for ischemic necrosis, diffuse peritonitis on 07/01/14. Pharmacy consulted to provide TPN 4/1-4/15 for nutrition support for prolonged ileus. Pt then  transitioned to po diet + supplements. 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  GI: Duodenal fistula. NPO. Open abd wound, (+)abd pain. Octreotide drip ordered 5/10 am. Albumin 1.7. Prealbumin remains low at 7.9. Trickle feeds started 5/12, +flatus, no BM - on 5/14 AM TFs were noted to be leaking out of blake drain and G-tube. TFs held however imaging showed normal placement - TFs remain held per MD on 5/15. Abdominal drain output/24h: 700 cc (slightly reduced), GJ output/24h: not charted appropriately    5/10 Add octreotide drip 5/11 IR placed gastro-jenjunal tube for post-pyloric feeding and drainage of gastric juices 5/12 to start Vital AF 1.2 trickle feedings via J tube at 10 ml/hr 5/13 Continues on trickle feeds at 10 ml/hr 5/14: Possible leak in GJ tube - TFs held. Imaging showed no change in JP position with tips in proper positions however pt still with pre-rectal fluid collection 5/15: TFs still being held. IR to replace drain for fluid collection 5/16: Going for IR percutaneous aspiration/drainage today  Endo: Hx DM/hypothyroidism. Had low CBGs prior to TPN start, current CBGs/24h: 68-176. No insulin in TPN, on sensitive SSI, synthroid (new)  Lytes: Lytes being added and  removed to the TPN bag as needed based on labs. Na 131, K 4.2, Phos 6.2, Mg 2.3, CoCa ~9.8.  Per discussion with renal Advertising copywriter) - wants pharmacy to manage K. Ca x Phos = 60  Renal: New ESRD - started 3/17. HD days changed to TTS. S/p extra session on 5/10. Last HD on 5/14 (3.5 hr BFR 250) - poor tolerance d/t CP and tachycardia, rapid response called. Per renal to continue on TTS for now, next session planned for 5/17   Heme: Anemia of ESRD. Hgb 8.6 > 11.3, plts wnl, on Aranesp 200 mcg-Tues. On Nulecit/HD  Pulm: Hx asthma/COPD. RA. CT negative for PE. CCM signed off 5/3  Cards: HTN. (afib).  Midodrine and amio on hold (no NGT and NPO) IV metoprolol. CHADS-VASc =4 (not good  candidate for [facility]). Cards rec restart IV amio if SVT worsens  Hepatotobil: Alk phos elevated, AST/ALT, Tbili wnl, TG 142 (5/16)  Neuro: Hx depression / insomnia - new stroke noted on 3/18.   ID: Afebrile, WBC 16 > 9.8, s/p treatment for CDiff 5/14 wound cx (abdomen): pseudomonas    Best Practices: SCDs, PPI IV  TPN Access: PICC line in IR 4/27  TPN start date: 4/1 >> 4/15; restart 4/27>>  Plan:  - Continue Clinimix (without lytes) 5/15 at goal rate of 100 ml/hr  + IVFE at 10 ml/hr - MV + TE in TPN - Continue sensitive SSI - Will f/u plans to resume TFs - No additional K needed today, watch K with HD K bath tomorrow - Watch Ca x Phos    Hughes Better, PharmD, BCPS Clinical Pharmacist Pager: [phone] 08/17/2014 9:13 AM

## 2014-08-18 ENCOUNTER — Inpatient Hospital Stay (HOSPITAL_COMMUNITY): Payer: Medicaid Other

## 2014-08-18 DIAGNOSIS — S31609A Unspecified open wound of abdominal wall, unspecified quadrant with penetration into peritoneal cavity, initial encounter: Secondary | ICD-10-CM

## 2014-08-18 DIAGNOSIS — E43 Unspecified severe protein-calorie malnutrition: Secondary | ICD-10-CM

## 2014-08-18 DIAGNOSIS — J449 Chronic obstructive pulmonary disease, unspecified: Secondary | ICD-10-CM

## 2014-08-18 DIAGNOSIS — I120 Hypertensive chronic kidney disease with stage 5 chronic kidney disease or end stage renal disease: Secondary | ICD-10-CM

## 2014-08-18 DIAGNOSIS — B965 Pseudomonas (aeruginosa) (mallei) (pseudomallei) as the cause of diseases classified elsewhere: Secondary | ICD-10-CM

## 2014-08-18 DIAGNOSIS — X58XXXA Exposure to other specified factors, initial encounter: Secondary | ICD-10-CM

## 2014-08-18 DIAGNOSIS — K550 Acute vascular disorders of intestine: Secondary | ICD-10-CM

## 2014-08-18 DIAGNOSIS — Z8619 Personal history of other infectious and parasitic diseases: Secondary | ICD-10-CM

## 2014-08-18 LAB — CBC
HCT: 38.1 % (ref 36.0–46.0)
Hemoglobin: 12.2 g/dL (ref 12.0–15.0)
MCH: 28.3 pg (ref 26.0–34.0)
MCHC: 32 g/dL (ref 30.0–36.0)
MCV: 88.4 fL (ref 78.0–100.0)
Platelets: 185 10*3/uL (ref 150–400)
RBC: 4.31 MIL/uL (ref 3.87–5.11)
RDW: 19.1 % — AB (ref 11.5–15.5)
WBC: 8.4 10*3/uL (ref 4.0–10.5)

## 2014-08-18 LAB — WOUND CULTURE

## 2014-08-18 LAB — COMPREHENSIVE METABOLIC PANEL
ALK PHOS: 163 U/L — AB (ref 38–126)
ALT: 8 U/L — ABNORMAL LOW (ref 14–54)
AST: 24 U/L (ref 15–41)
Albumin: 1.5 g/dL — ABNORMAL LOW (ref 3.5–5.0)
Anion gap: 12 (ref 5–15)
BUN: 98 mg/dL — ABNORMAL HIGH (ref 6–20)
CALCIUM: 7.7 mg/dL — AB (ref 8.9–10.3)
CO2: 24 mmol/L (ref 22–32)
Chloride: 93 mmol/L — ABNORMAL LOW (ref 101–111)
Creatinine, Ser: 5.84 mg/dL — ABNORMAL HIGH (ref 0.44–1.00)
GFR calc non Af Amer: 7 mL/min — ABNORMAL LOW (ref >60)
GFR, EST AFRICAN AMERICAN: 8 mL/min — AB (ref >60)
Glucose, Bld: 125 mg/dL — ABNORMAL HIGH (ref 65–99)
POTASSIUM: 4.5 mmol/L (ref 3.5–5.1)
Sodium: 129 mmol/L — ABNORMAL LOW (ref 135–145)
TOTAL PROTEIN: 6.2 g/dL — AB (ref 6.5–8.1)
Total Bilirubin: 1.2 mg/dL (ref 0.3–1.2)

## 2014-08-18 LAB — HEPATITIS B SURFACE ANTIGEN: HEP B S AG: NEGATIVE

## 2014-08-18 LAB — GLUCOSE, CAPILLARY
GLUCOSE-CAPILLARY: 116 mg/dL — AB (ref 65–99)
GLUCOSE-CAPILLARY: 127 mg/dL — AB (ref 65–99)
GLUCOSE-CAPILLARY: 137 mg/dL — AB (ref 65–99)
Glucose-Capillary: 136 mg/dL — ABNORMAL HIGH (ref 65–99)

## 2014-08-18 MED ORDER — LORAZEPAM 2 MG/ML IJ SOLN
INTRAMUSCULAR | Status: DC
Start: 2014-08-18 — End: 2014-08-18
  Filled 2014-08-18: qty 1

## 2014-08-18 MED ORDER — CLINIMIX/DEXTROSE (5/15) 5 % IV SOLN
INTRAVENOUS | Status: AC
Start: 2014-08-18 — End: 2014-08-19
  Administered 2014-08-18: 17:00:00 via INTRAVENOUS
  Filled 2014-08-18: qty 2400

## 2014-08-18 MED ORDER — METRONIDAZOLE IN NACL 5-0.79 MG/ML-% IV SOLN
500.0000 mg | Freq: Three times a day (TID) | INTRAVENOUS | Status: DC
  Administered 2014-08-18 – 2014-08-27 (×27): 500 mg via INTRAVENOUS
  Filled 2014-08-18 (×32): qty 100

## 2014-08-18 MED ORDER — ALTEPLASE 2 MG IJ SOLR
2.0000 mg | Freq: Once | INTRAMUSCULAR | Status: AC
  Administered 2014-08-18: 2 mg
  Filled 2014-08-18: qty 2

## 2014-08-18 MED ORDER — FAT EMULSION 20 % IV EMUL
240.0000 mL | INTRAVENOUS | Status: AC
  Administered 2014-08-18: 240 mL via INTRAVENOUS
  Filled 2014-08-18: qty 250

## 2014-08-18 MED ORDER — DEXTROSE 5 % IV SOLN
2.0000 g | INTRAVENOUS | Status: DC
  Administered 2014-08-18 – 2014-08-25 (×4): 2 g via INTRAVENOUS
  Filled 2014-08-18 (×7): qty 2

## 2014-08-18 NOTE — Consult Note (Signed)
[facility]  Date of Admission:  06/17/2014  Date of Consult:  08/18/2014  Reason for Consult: Wound infection, Abscess Referring Physician: Burgess Estelle  Impression/Recommendation Wound infection Abscess Perforated pyloric ulcer Ischemic necrosis ileum COPD CKD, ESRD on HD HTN Protein calorie malnutrition, severe C diff (4-12)  +HIT  Would: Resume ceftaz with HD Resume IV flagyl  Comment- I am not certain of the significance of her pseudomonas Cx (superficial?). Await her deep Cx from 5-16. She has been improving by lab criteria (WBC better, temps nl) without anbx.  Overall, I am not sure how much the anbx will help as she has multiple severe issues. Appreciate ongoing care from palliative care physicians.   Thank you so much for this interesting consult,   Bobby Rumpf (pager) [phone] [email or web address]  [name] is an 59 y.o. female.  HPI: 59 yo F adm 3-16 with SOB and weakness for 2 weeks after being off diuretics. She was found to be influenza A+.   (3-18) Her hospital course was also complicated by L sided weakness and a new/acute R ACA CVA. She was also found on TEE (3-22) to have PFO, RV dysfunction and severe TR.  (3-26) Her course was also complicated by AVF malfunction, placement of new tunneled catheter.   (3-30) she had worsening hypoxia, was on anbx for HCAP. Vanco/zosyn. She underwent CT chest/abd/pelvis and was found to have: 1. Perforated bowel with large pneumoperitoneum. Ileal pneumatosis suggests small bowel ischemia/necrosis. In this patient with recent embolic stroke, suspect mesenteric embolus. 2. Moderate ascites with feculent material or developing abscess in the rectovaginal recess. 3. Left lower lobe collapse. 4. 29 mm infrarenal aortic aneurysm. She was taken to OR on 3-31 and found to have perforated pyloric ulcer, ischemic necrosis of ileum (? Embolic). She remained intbx post procedure and required  pressors. She had persistent leukocytosis and had repeat CT on 4-4 showing 7+cm fluid collection in the pelvis. This was drained by IR on 4-8. This Cx was (-).  ( 4-12 ) Her course was further complicated by C diff.  (4-22) She underwent repeat CT showing 6.6 x 4.3 pre-rectal fluid collection. She had a fistula study on 4-25 which was +.  (5-2) she underwent repeat CT showing 3 x 3.5 x 5.7 xm fluid collection.  (5-11) Gastrostomy tube placed.  (5-14) repeat CT showed:Stable 3 cm infrarenal abdominal aortic aneurysm. No change in position of JP surgical drain with distal tip in thel eft upper quadrant of the abdomen. Interval placement of percutaneous gastrojejunostomy tube with distal tip in the jejunum. Grossly stable size and appearance of 5 cm fluid collection seen in posterior cul-de-sac of the pelvis.  She had an abdominal wound Cx done on 5-14 showing (P aeruginosa).  An abscess Cx from 5-16 is pending.   Her WBC is normal over last 2 days.  She has been afebrile in the month of May.   Her course has also been complicated by runs of SVT throughout her hospital stay. Also, she has not wished to speak about her EOL issues.   "i'm ready to get up and walk out of here". Seen in HD  Past Medical History  Diagnosis Date  . Asthma   . Hypertension   . Gout   . Arthritis   . Insomnia   . Chronic kidney disease   . Depression   . Renal insufficiency   . Aortic aneurysm without rupture     3cm by CT 08/03/14  .  Paroxysmal SVT (supraventricular tachycardia)   . Cor pulmonale   . Stroke 06/2014  . PFO (patent foramen ovale)   . COPD (chronic obstructive pulmonary disease)     Past Surgical History  Procedure Laterality Date  . Multiple tooth extractions    . Av fistula placement Left 01/28/2014    Procedure: ARTERIOVENOUS (AV) FISTULA CREATION;  Surgeon: Elam Dutch, MD;  Location: [facility];  Service: Vascular;  Laterality: Left;  . Tee without cardioversion N/A 06/23/2014     Procedure: TRANSESOPHAGEAL ECHOCARDIOGRAM (TEE);  Surgeon: Lelon Perla, MD;  Location: [facility];  Service: Cardiovascular;  Laterality: N/A;  . Insertion of dialysis catheter Right 06/27/2014    Procedure: INSERTION OF Right Internal Jugular DIATEK CATHETER;  Surgeon: Mal Misty, MD;  Location: [facility];  Service: Vascular;  Laterality: Right;  . Laparotomy N/A 07/01/2014    Procedure: EXPLORATORY LAPAROTOMY WITH CLOSURE OF PERFORATED PYLORIC ULCER;  Surgeon: Fanny Skates, MD;  Location: [facility];  Service: General;  Laterality: N/A;  . Bowel resection N/A 07/01/2014    Procedure: SMALL BOWEL RESECTION;  Surgeon: Fanny Skates, MD;  Location: [facility];  Service: General;  Laterality: N/A;     No Known Allergies  Medications:  Scheduled: . antiseptic oral rinse  7 mL Mouth Rinse q12n4p  . budesonide (PULMICORT) nebulizer solution  0.25 mg Nebulization BID  . chlorhexidine  15 mL Mouth Rinse BID  . ferric gluconate (FERRLECIT/NULECIT) IV  125 mg Intravenous Q T,Th,Sa-HD  . insulin aspart  0-9 Units Subcutaneous 4 times per day  . levothyroxine  12.5 mcg Intravenous Daily  . metoprolol  5 mg Intravenous Q4H  . sodium chloride  10-40 mL Intracatheter Q12H    Abtx:  Anti-infectives    Start     Dose/Rate Route Frequency Ordered Stop   08/12/14 1449  ceFAZolin (ANCEF) 2-3 GM-% IVPB SOLR    Comments:  Shaaron Adler   : cabinet override      08/12/14 1449 08/12/14 1629   08/12/14 1130  ceFAZolin (ANCEF) IVPB 2 g/50 mL premix    Comments:  Hang ON CALL to xray 5/11   2 g 100 mL/hr over 30 Minutes Intravenous To Radiology 08/12/14 1035 08/12/14 1520   08/04/14 1200  vancomycin (VANCOCIN) 50 mg/mL oral solution 125 mg  Status:  Discontinued     125 mg Oral 4 times per day 08/04/14 0851 08/08/14 1229   07/18/14 1800  vancomycin (VANCOCIN) 50 mg/mL oral solution 125 mg  Status:  Discontinued     125 mg Oral 4 times per day 07/18/14 1455 08/04/14 0845   07/17/14 1830  vancomycin  (VANCOCIN) IVPB 750 mg/150 ml premix     750 mg 150 mL/hr over 60 Minutes Intravenous  Once 07/17/14 1818 07/18/14 0024   07/15/14 1400  metroNIDAZOLE (FLAGYL) tablet 500 mg  Status:  Discontinued     500 mg Oral 3 times per day 07/15/14 1202 07/18/14 1533   07/13/14 2200  piperacillin-tazobactam (ZOSYN) IVPB 2.25 g  Status:  Discontinued     2.25 g 100 mL/hr over 30 Minutes Intravenous 3 times per day 07/13/14 1318 07/24/14 1138   07/13/14 2000  fluconazole (DIFLUCAN) IVPB 200 mg  Status:  Discontinued     200 mg 100 mL/hr over 60 Minutes Intravenous Every 24 hours 07/13/14 1318 07/19/14 1027   07/07/14 1200  vancomycin (VANCOCIN) IVPB 1000 mg/200 mL premix  Status:  Discontinued     1,000 mg 200 mL/hr over 60  Minutes Intravenous Every 24 hours 07/07/14 0937 07/14/14 1712   07/06/14 2000  fluconazole (DIFLUCAN) IVPB 400 mg  Status:  Discontinued     400 mg 100 mL/hr over 120 Minutes Intravenous Every 24 hours 07/06/14 1507 07/13/14 1318   07/06/14 1800  piperacillin-tazobactam (ZOSYN) IVPB 2.25 g  Status:  Discontinued     2.25 g 100 mL/hr over 30 Minutes Intravenous 4 times per day 07/06/14 1505 07/13/14 1318   07/05/14 2000  fluconazole (DIFLUCAN) IVPB 200 mg  Status:  Discontinued     200 mg 100 mL/hr over 60 Minutes Intravenous Every 24 hours 07/05/14 0757 07/06/14 1507   07/05/14 1400  piperacillin-tazobactam (ZOSYN) IVPB 2.25 g  Status:  Discontinued     2.25 g 100 mL/hr over 30 Minutes Intravenous 3 times per day 07/05/14 0749 07/06/14 1505   07/02/14 1800  vancomycin (VANCOCIN) IVPB 1000 mg/200 mL premix  Status:  Discontinued     1,000 mg 200 mL/hr over 60 Minutes Intravenous Every 24 hours 07/01/14 1858 07/05/14 0801   07/01/14 2200  piperacillin-tazobactam (ZOSYN) IVPB 3.375 g  Status:  Discontinued     3.375 g 100 mL/hr over 30 Minutes Intravenous 4 times per day 07/01/14 1858 07/05/14 0749   07/01/14 2000  fluconazole (DIFLUCAN) IVPB 400 mg  Status:  Discontinued      400 mg 100 mL/hr over 120 Minutes Intravenous Every 24 hours 07/01/14 1858 07/05/14 0757   07/01/14 1400  fluconazole (DIFLUCAN) IVPB 200 mg  Status:  Discontinued     200 mg 100 mL/hr over 60 Minutes Intravenous Every 24 hours 07/01/14 1330 07/01/14 1853   07/01/14 1000  piperacillin-tazobactam (ZOSYN) IVPB 2.25 g  Status:  Discontinued     2.25 g 100 mL/hr over 30 Minutes Intravenous Every 8 hours 07/01/14 0952 07/01/14 1853   07/01/14 1000  vancomycin (VANCOCIN) 500 mg in sodium chloride 0.9 % 100 mL IVPB     500 mg 100 mL/hr over 60 Minutes Intravenous  Once 07/01/14 0952 07/01/14 1126   06/30/14 1200  vancomycin (VANCOCIN) IVPB 1000 mg/200 mL premix     1,000 mg 200 mL/hr over 60 Minutes Intravenous  Once 06/30/14 0944 06/30/14 1403   06/30/14 1200  ceFEPIme (MAXIPIME) 2 g in dextrose 5 % 50 mL IVPB     2 g 100 mL/hr over 30 Minutes Intravenous  Once 06/30/14 0944 06/30/14 1425   06/30/14 0100  vancomycin (VANCOCIN) 2,000 mg in sodium chloride 0.9 % 500 mL IVPB     2,000 mg 250 mL/hr over 120 Minutes Intravenous  Once 06/30/14 0048 06/30/14 0525   06/30/14 0100  ceFEPIme (MAXIPIME) 2 g in dextrose 5 % 50 mL IVPB     2 g 100 mL/hr over 30 Minutes Intravenous  Once 06/30/14 0048 06/30/14 0322   06/27/14 0600  cefUROXime (ZINACEF) 1.5 g in dextrose 5 % 50 mL IVPB     1.5 g 100 mL/hr over 30 Minutes Intravenous On call to O.R. 06/26/14 1019 06/27/14 0630   06/19/14 1800  oseltamivir (TAMIFLU) capsule 30 mg     30 mg Oral Every M-W-F (1800) 06/19/14 1003 06/22/14 1841   06/18/14 1600  oseltamivir (TAMIFLU) capsule 30 mg  Status:  Discontinued     30 mg Oral Daily 06/18/14 1538 06/19/14 1003      Total days of antibiotics: off since 5-11          Social History:  reports that she has been smoking Cigarettes.  She has  a 10 pack-year smoking history. She has never used smokeless tobacco. She reports that she drinks alcohol. She reports that she does not use illicit drugs.  Family  History  Problem Relation Age of Onset  . Diabetes Mother   . Hypertension Mother   . Heart disease Mother   . Heart attack Mother   . Peripheral vascular disease Mother   . Diabetes Father   . Heart disease Father   . Hypertension Father   . Diabetes Sister   . Hypertension Sister   . Heart disease Sister     before age 25  . Heart attack Sister   . Peripheral vascular disease Sister   . Heart disease Brother   . Hyperlipidemia Daughter     General ROS: see HPI. no making stool.   Blood pressure 98/80, pulse 100, temperature 97.8 F (36.6 C), temperature source Oral, resp. rate 20, height $RemoveBe'5\' 6"'[id number]$  (1.676 m), weight 96 kg (211 lb 10.3 oz), SpO2 99 %. General appearance: alert, cooperative and no distress Eyes: negative findings: conjunctivae and sclerae normal and pupils equal, round, reactive to light and accomodation Throat: normal findings: oropharynx pink & moist without lesions or evidence of thrush Neck: no adenopathy and supple, symmetrical, trachea midline Lungs: diminished breath sounds bilaterally Heart: regular rate and rhythm Abdomen: normal findings: bowel sounds normal, soft, non-tender and midline wound, partially undressed.clean.  Extremities: edema none and RUE fistula with bruit. no LE lesions.   R chest TLC, clean.  L chest HD line.    Results for orders placed or performed during the hospital encounter of 06/17/14 (from the past 48 hour(s))  Glucose, capillary     Status: Abnormal   Collection Time: 08/16/14 12:02 PM  Result Value Ref Range   Glucose-Capillary 155 (H) 65 - 99 mg/dL  Glucose, capillary     Status: Abnormal   Collection Time: 08/16/14  6:03 PM  Result Value Ref Range   Glucose-Capillary 120 (H) 65 - 99 mg/dL  Glucose, capillary     Status: Abnormal   Collection Time: 08/17/14 12:38 AM  Result Value Ref Range   Glucose-Capillary 156 (H) 65 - 99 mg/dL  Comprehensive metabolic panel     Status: Abnormal   Collection Time: 08/17/14  4:30  AM  Result Value Ref Range   Sodium 131 (L) 135 - 145 mmol/L   Potassium 4.2 3.5 - 5.1 mmol/L   Chloride 95 (L) 101 - 111 mmol/L   CO2 26 22 - 32 mmol/L   Glucose, Bld 68 65 - 99 mg/dL   BUN 71 (H) 6 - 20 mg/dL   Creatinine, Ser 4.80 (H) 0.44 - 1.00 mg/dL   Calcium 7.9 (L) 8.9 - 10.3 mg/dL   Total Protein 6.3 (L) 6.5 - 8.1 g/dL   Albumin 1.6 (L) 3.5 - 5.0 g/dL   AST 29 15 - 41 U/L   ALT 9 (L) 14 - 54 U/L   Alkaline Phosphatase 162 (H) 38 - 126 U/L   Total Bilirubin 1.1 0.3 - 1.2 mg/dL   GFR calc non Af Amer 9 (L) >60 mL/min   GFR calc Af Amer 11 (L) >60 mL/min    Comment: (NOTE) The eGFR has been calculated using the CKD EPI equation. This calculation has not been validated in all clinical situations. eGFR's persistently <60 mL/min signify possible Chronic Kidney Disease.    Anion gap 10 5 - 15  Magnesium     Status: None   Collection Time: 08/17/14  4:30  AM  Result Value Ref Range   Magnesium 2.3 1.7 - 2.4 mg/dL  Phosphorus     Status: Abnormal   Collection Time: 08/17/14  4:30 AM  Result Value Ref Range   Phosphorus 6.2 (H) 2.5 - 4.6 mg/dL  CBC     Status: Abnormal   Collection Time: 08/17/14  4:30 AM  Result Value Ref Range   WBC 9.8 4.0 - 10.5 K/uL   RBC 4.01 3.87 - 5.11 MIL/uL   Hemoglobin 11.3 (L) 12.0 - 15.0 g/dL    Comment: REPEATED TO VERIFY   HCT 36.3 36.0 - 46.0 %   MCV 90.5 78.0 - 100.0 fL   MCH 28.2 26.0 - 34.0 pg   MCHC 31.1 30.0 - 36.0 g/dL   RDW 19.3 (H) 11.5 - 15.5 %   Platelets 203 150 - 400 K/uL  Differential     Status: Abnormal   Collection Time: 08/17/14  4:30 AM  Result Value Ref Range   Neutrophils Relative % 74 43 - 77 %   Neutro Abs 7.3 1.7 - 7.7 K/uL   Lymphocytes Relative 10 (L) 12 - 46 %   Lymphs Abs 1.0 0.7 - 4.0 K/uL   Monocytes Relative 13 (H) 3 - 12 %   Monocytes Absolute 1.3 (H) 0.1 - 1.0 K/uL   Eosinophils Relative 3 0 - 5 %   Eosinophils Absolute 0.3 0.0 - 0.7 K/uL   Basophils Relative 0 0 - 1 %   Basophils Absolute 0.0 0.0  - 0.1 K/uL  Prealbumin     Status: Abnormal   Collection Time: 08/17/14  4:30 AM  Result Value Ref Range   Prealbumin 7.9 (L) 18 - 38 mg/dL  Triglycerides     Status: None   Collection Time: 08/17/14  4:30 AM  Result Value Ref Range   Triglycerides 142 <150 mg/dL  Glucose, capillary     Status: None   Collection Time: 08/17/14  6:09 AM  Result Value Ref Range   Glucose-Capillary 96 65 - 99 mg/dL  Glucose, capillary     Status: Abnormal   Collection Time: 08/17/14 11:59 AM  Result Value Ref Range   Glucose-Capillary 162 (H) 65 - 99 mg/dL  Culture, routine-abscess     Status: None (Preliminary result)   Collection Time: 08/17/14  2:51 PM  Result Value Ref Range   Specimen Description ABSCESS PELVIS    Special Requests NONE    Gram Stain PENDING    Culture NO GROWTH Performed at [facility]     Report Status PENDING   Glucose, capillary     Status: Abnormal   Collection Time: 08/17/14  3:44 PM  Result Value Ref Range   Glucose-Capillary 112 (H) 65 - 99 mg/dL  Glucose, capillary     Status: Abnormal   Collection Time: 08/18/14 12:26 AM  Result Value Ref Range   Glucose-Capillary 116 (H) 65 - 99 mg/dL  CBC     Status: Abnormal   Collection Time: 08/18/14  4:49 AM  Result Value Ref Range   WBC 8.4 4.0 - 10.5 K/uL   RBC 4.31 3.87 - 5.11 MIL/uL   Hemoglobin 12.2 12.0 - 15.0 g/dL   HCT 38.1 36.0 - 46.0 %   MCV 88.4 78.0 - 100.0 fL   MCH 28.3 26.0 - 34.0 pg   MCHC 32.0 30.0 - 36.0 g/dL   RDW 19.1 (H) 11.5 - 15.5 %   Platelets 185 150 - 400 K/uL  Comprehensive metabolic panel  Status: Abnormal   Collection Time: 08/18/14  4:49 AM  Result Value Ref Range   Sodium 129 (L) 135 - 145 mmol/L   Potassium 4.5 3.5 - 5.1 mmol/L   Chloride 93 (L) 101 - 111 mmol/L   CO2 24 22 - 32 mmol/L   Glucose, Bld 125 (H) 65 - 99 mg/dL   BUN 98 (H) 6 - 20 mg/dL   Creatinine, Ser 5.84 (H) 0.44 - 1.00 mg/dL   Calcium 7.7 (L) 8.9 - 10.3 mg/dL   Total Protein 6.2 (L) 6.5 - 8.1 g/dL    Albumin 1.5 (L) 3.5 - 5.0 g/dL   AST 24 15 - 41 U/L   ALT 8 (L) 14 - 54 U/L   Alkaline Phosphatase 163 (H) 38 - 126 U/L   Total Bilirubin 1.2 0.3 - 1.2 mg/dL   GFR calc non Af Amer 7 (L) >60 mL/min   GFR calc Af Amer 8 (L) >60 mL/min    Comment: (NOTE) The eGFR has been calculated using the CKD EPI equation. This calculation has not been validated in all clinical situations. eGFR's persistently <60 mL/min signify possible Chronic Kidney Disease.    Anion gap 12 5 - 15  Glucose, capillary     Status: Abnormal   Collection Time: 08/18/14  6:17 AM  Result Value Ref Range   Glucose-Capillary 136 (H) 65 - 99 mg/dL      Component Value Date/Time   SDES ABSCESS PELVIS 08/17/2014 1451   SPECREQUEST NONE 08/17/2014 1451   CULT NO GROWTH Performed at [facility]  08/17/2014 1451   REPTSTATUS PENDING 08/17/2014 1451   Ct Image Guided Fluid Drain By Catheter  08/17/2014   CLINICAL DATA:  Postoperative intra-abdominal abscess, subsequent encounter. Small pelvic fluid collection.  EXAM: CT-GUIDED DRAIN PLACEMENT IN PELVIC FLUID COLLECTION  Physician: Stephan Minister. Anselm Pancoast, MD  FLUOROSCOPY TIME:  None  MEDICATIONS: 50 mcg fentanyl  ANESTHESIA/SEDATION: Patient was monitored by a radiology nurse during the procedure.  PROCEDURE: Informed consent was obtained for drain placement. Patient was placed on her left side. Images through the pelvis were obtained. The right buttock was prepped and draped in sterile fashion. 1% lidocaine was used for a local anesthetic. An 18 gauge needle was directed into the pelvic fluid collection with CT guidance from a transgluteal approach. Thick yellow purulent fluid was aspirated. A stiff Amplatz wire was placed. The tract was dilated to accommodate a 10.2 [place] multipurpose drain. It was technically difficult to advance the drain into the collection due to the small size of the collection and the patient's body habitus. Eventually, the drain was successfully placed  within the collection. 5 mL of yellow purulent fluid was obtained. Catheter was sutured to the skin and attached to a suction bulb. Fluid was sent for culture.  FINDINGS: Small fluid collection just posterior to the uterus. A total of 5 mL of yellow purulent fluid was removed.  Estimated blood loss: Minimal  COMPLICATIONS: None  IMPRESSION: CT-guided placement of a drainage catheter in the small pelvic fluid collection.   Electronically Signed   By: Markus Daft M.D.   On: 08/17/2014 17:21   Recent Results (from the past 240 hour(s))  Wound culture     Status: None   Collection Time: 08/15/14  6:27 AM  Result Value Ref Range Status   Specimen Description WOUND  Final   Special Requests ABDOMIN  Final   Gram Stain   Final    MODERATE WBC PRESENT, PREDOMINANTLY PMN  NO SQUAMOUS EPITHELIAL CELLS SEEN FEW GRAM NEGATIVE RODS Performed at [facility]    Culture   Final    ABUNDANT PSEUDOMONAS AERUGINOSA Performed at [facility]    Report Status 08/18/2014 FINAL  Final   Organism ID, Bacteria PSEUDOMONAS AERUGINOSA  Final      Susceptibility   Pseudomonas aeruginosa - MIC*    CEFEPIME 16 INTERMEDIATE Intermediate     CEFTAZIDIME 4 SENSITIVE Sensitive     CIPROFLOXACIN <=0.25 SENSITIVE Sensitive     GENTAMICIN 2 SENSITIVE Sensitive     IMIPENEM 1 SENSITIVE Sensitive     PIP/TAZO 16 SENSITIVE Sensitive     TOBRAMYCIN <=1 SENSITIVE Sensitive     * ABUNDANT PSEUDOMONAS AERUGINOSA  Culture, routine-abscess     Status: None (Preliminary result)   Collection Time: 08/17/14  2:51 PM  Result Value Ref Range Status   Specimen Description ABSCESS PELVIS  Final   Special Requests NONE  Final   Gram Stain PENDING  Incomplete   Culture NO GROWTH Performed at [facility]   Final   Report Status PENDING  Incomplete      08/18/2014, 9:22 AM     LOS: 62 days

## 2014-08-18 NOTE — Progress Notes (Signed)
[facility] Progress Note  48 Days Post-Op  Subjective: Somewhat anxious, but less tachycardic today in HD.  Wound with some purulent drainage, but less than in past few days.  Pt only on ice chips.  No N/V, abdominal pain only with dressing changes.  Still can not sit in chair for HD.  C/o dry/itchy skin.    Objective: Vital signs in last 24 hours: Temp:  [97.8 F (36.6 C)-98.5 F (36.9 C)] 97.8 F (36.6 C) (05/17 0727) Pulse Rate:  [93-112] 100 (05/17 0830) Resp:  [16-22] 20 (05/17 0830) BP: (85-141)/(41-119) 98/80 mmHg (05/17 0830) SpO2:  [93 %-100 %] 99 % (05/17 0800) Weight:  [96 kg (211 lb 10.3 oz)] 96 kg (211 lb 10.3 oz) (05/17 0438) Last BM Date: 08/15/14  Intake/Output from previous day: 05/16 0701 - 05/17 0700 In: 3315 [I.V.:625; TPN:2640] Out: 450 [Drains:450] Intake/Output this shift:    PE: Gen: Alert, NAD, pleasant  Abd: Soft, mildly tender, ND, +BS, no HSM, midline wound with yellow slough at base and some purulent looking drainage at inferior aspect, some wound dehiscence at inferior aspect, sutures exposed, some granulation tissue formation at edges, Blake drain in Right abdomen with 429mL/24hr bilious (but bulb is full while in HD because it's not up to suction), minimal bilious output in G-tube 25mL/24hr, new right trans-gluteal drain with 5mL yellow purulent fluid drained yesterday, no output per nursing staff.   Lab Results:   Recent Labs  08/17/14 0430 08/18/14 0449  WBC 9.8 8.4  HGB 11.3* 12.2  HCT 36.3 38.1  PLT 203 185   BMET  Recent Labs  08/17/14 0430 08/18/14 0449  NA 131* 129*  K 4.2 4.5  CL 95* 93*  CO2 26 24  GLUCOSE 68 125*  BUN 71* 98*  CREATININE 4.80* 5.84*  CALCIUM 7.9* 7.7*   PT/INR No results for input(s): LABPROT, INR in the last 72 hours. CMP     Component Value Date/Time   NA 129* 08/18/2014 0449   NA 139 10/02/2012 1046   K 4.5 08/18/2014 0449   K 4.4 10/02/2012 1046   CL 93* 08/18/2014 0449    CO2 24 08/18/2014 0449   CO2 25 10/02/2012 1046   GLUCOSE 125* 08/18/2014 0449   GLUCOSE 110 10/02/2012 1046   BUN 98* 08/18/2014 0449   BUN 35.2* 10/02/2012 1046   CREATININE 5.84* 08/18/2014 0449   CREATININE 2.5* 10/02/2012 1046   CALCIUM 7.7* 08/18/2014 0449   CALCIUM 9.8 10/02/2012 1046   PROT 6.2* 08/18/2014 0449   PROT 8.4* 10/02/2012 1046   ALBUMIN 1.5* 08/18/2014 0449   ALBUMIN 3.1* 10/02/2012 1046   AST 24 08/18/2014 0449   AST 9 10/02/2012 1046   ALT 8* 08/18/2014 0449   ALT <6 Repeated and Verified 10/02/2012 1046   ALKPHOS 163* 08/18/2014 0449   ALKPHOS 108 10/02/2012 1046   BILITOT 1.2 08/18/2014 0449   BILITOT 0.21 10/02/2012 1046   GFRNONAA 7* 08/18/2014 0449   GFRAA 8* 08/18/2014 0449   Lipase  No results found for: LIPASE     Studies/Results: Ct Image Guided Fluid Drain By Catheter  08/17/2014   CLINICAL DATA:  Postoperative intra-abdominal abscess, subsequent encounter. Small pelvic fluid collection.  EXAM: CT-GUIDED DRAIN PLACEMENT IN PELVIC FLUID COLLECTION  Physician: Rachelle Hora. Lowella Dandy, MD  FLUOROSCOPY TIME:  None  MEDICATIONS: 50 mcg fentanyl  ANESTHESIA/SEDATION: Patient was monitored by a radiology nurse during the procedure.  PROCEDURE: Informed consent was obtained for drain placement. Patient was placed  on her left side. Images through the pelvis were obtained. The right buttock was prepped and draped in sterile fashion. 1% lidocaine was used for a local anesthetic. An 18 gauge needle was directed into the pelvic fluid collection with CT guidance from a transgluteal approach. Thick yellow purulent fluid was aspirated. A stiff Amplatz wire was placed. The tract was dilated to accommodate a 10.2 [place] multipurpose drain. It was technically difficult to advance the drain into the collection due to the small size of the collection and the patient's body habitus. Eventually, the drain was successfully placed within the collection. 5 mL of yellow purulent fluid was  obtained. Catheter was sutured to the skin and attached to a suction bulb. Fluid was sent for culture.  FINDINGS: Small fluid collection just posterior to the uterus. A total of 5 mL of yellow purulent fluid was removed.  Estimated blood loss: Minimal  COMPLICATIONS: None  IMPRESSION: CT-guided placement of a drainage catheter in the small pelvic fluid collection.   Electronically Signed   By: Richarda Overlie M.D.   On: 08/17/2014 17:21    Anti-infectives: Anti-infectives    Start     Dose/Rate Route Frequency Ordered Stop   08/12/14 1449  ceFAZolin (ANCEF) 2-3 GM-% IVPB SOLR    Comments:  Troy Sine   : cabinet override      08/12/14 1449 08/12/14 1629   08/12/14 1130  ceFAZolin (ANCEF) IVPB 2 g/50 mL premix    Comments:  Hang ON CALL to xray 5/11   2 g 100 mL/hr over 30 Minutes Intravenous To Radiology 08/12/14 1035 08/12/14 1520   08/04/14 1200  vancomycin (VANCOCIN) 50 mg/mL oral solution 125 mg  Status:  Discontinued     125 mg Oral 4 times per day 08/04/14 0851 08/08/14 1229   07/18/14 1800  vancomycin (VANCOCIN) 50 mg/mL oral solution 125 mg  Status:  Discontinued     125 mg Oral 4 times per day 07/18/14 1455 08/04/14 0845   07/17/14 1830  vancomycin (VANCOCIN) IVPB 750 mg/150 ml premix     750 mg 150 mL/hr over 60 Minutes Intravenous  Once 07/17/14 1818 07/18/14 0024   07/15/14 1400  metroNIDAZOLE (FLAGYL) tablet 500 mg  Status:  Discontinued     500 mg Oral 3 times per day 07/15/14 1202 07/18/14 1533   07/13/14 2200  piperacillin-tazobactam (ZOSYN) IVPB 2.25 g  Status:  Discontinued     2.25 g 100 mL/hr over 30 Minutes Intravenous 3 times per day 07/13/14 1318 07/24/14 1138   07/13/14 2000  fluconazole (DIFLUCAN) IVPB 200 mg  Status:  Discontinued     200 mg 100 mL/hr over 60 Minutes Intravenous Every 24 hours 07/13/14 1318 07/19/14 1027   07/07/14 1200  vancomycin (VANCOCIN) IVPB 1000 mg/200 mL premix  Status:  Discontinued     1,000 mg 200 mL/hr over 60 Minutes Intravenous  Every 24 hours 07/07/14 0937 07/14/14 1712   07/06/14 2000  fluconazole (DIFLUCAN) IVPB 400 mg  Status:  Discontinued     400 mg 100 mL/hr over 120 Minutes Intravenous Every 24 hours 07/06/14 1507 07/13/14 1318   07/06/14 1800  piperacillin-tazobactam (ZOSYN) IVPB 2.25 g  Status:  Discontinued     2.25 g 100 mL/hr over 30 Minutes Intravenous 4 times per day 07/06/14 1505 07/13/14 1318   07/05/14 2000  fluconazole (DIFLUCAN) IVPB 200 mg  Status:  Discontinued     200 mg 100 mL/hr over 60 Minutes Intravenous Every 24 hours 07/05/14 0757  07/06/14 1507   07/05/14 1400  piperacillin-tazobactam (ZOSYN) IVPB 2.25 g  Status:  Discontinued     2.25 g 100 mL/hr over 30 Minutes Intravenous 3 times per day 07/05/14 0749 07/06/14 1505   07/02/14 1800  vancomycin (VANCOCIN) IVPB 1000 mg/200 mL premix  Status:  Discontinued     1,000 mg 200 mL/hr over 60 Minutes Intravenous Every 24 hours 07/01/14 1858 07/05/14 0801   07/01/14 2200  piperacillin-tazobactam (ZOSYN) IVPB 3.375 g  Status:  Discontinued     3.375 g 100 mL/hr over 30 Minutes Intravenous 4 times per day 07/01/14 1858 07/05/14 0749   07/01/14 2000  fluconazole (DIFLUCAN) IVPB 400 mg  Status:  Discontinued     400 mg 100 mL/hr over 120 Minutes Intravenous Every 24 hours 07/01/14 1858 07/05/14 0757   07/01/14 1400  fluconazole (DIFLUCAN) IVPB 200 mg  Status:  Discontinued     200 mg 100 mL/hr over 60 Minutes Intravenous Every 24 hours 07/01/14 1330 07/01/14 1853   07/01/14 1000  piperacillin-tazobactam (ZOSYN) IVPB 2.25 g  Status:  Discontinued     2.25 g 100 mL/hr over 30 Minutes Intravenous Every 8 hours 07/01/14 0952 07/01/14 1853   07/01/14 1000  vancomycin (VANCOCIN) 500 mg in sodium chloride 0.9 % 100 mL IVPB     500 mg 100 mL/hr over 60 Minutes Intravenous  Once 07/01/14 0952 07/01/14 1126   06/30/14 1200  vancomycin (VANCOCIN) IVPB 1000 mg/200 mL premix     1,000 mg 200 mL/hr over 60 Minutes Intravenous  Once 06/30/14 0944 06/30/14  1403   06/30/14 1200  ceFEPIme (MAXIPIME) 2 g in dextrose 5 % 50 mL IVPB     2 g 100 mL/hr over 30 Minutes Intravenous  Once 06/30/14 0944 06/30/14 1425   06/30/14 0100  vancomycin (VANCOCIN) 2,000 mg in sodium chloride 0.9 % 500 mL IVPB     2,000 mg 250 mL/hr over 120 Minutes Intravenous  Once 06/30/14 0048 06/30/14 0525   06/30/14 0100  ceFEPIme (MAXIPIME) 2 g in dextrose 5 % 50 mL IVPB     2 g 100 mL/hr over 30 Minutes Intravenous  Once 06/30/14 0048 06/30/14 0322   06/27/14 0600  cefUROXime (ZINACEF) 1.5 g in dextrose 5 % 50 mL IVPB     1.5 g 100 mL/hr over 30 Minutes Intravenous On call to O.R. 06/26/14 1019 06/27/14 0630   06/19/14 1800  oseltamivir (TAMIFLU) capsule 30 mg     30 mg Oral Every M-W-F (1800) 06/19/14 1003 06/22/14 1841   06/18/14 1600  oseltamivir (TAMIFLU) capsule 30 mg  Status:  Discontinued     30 mg Oral Daily 06/18/14 1538 06/19/14 1003       Assessment/Plan POD #48 s/p Exploratory Laparotomy with graham patch closure of perforated pyloric ulcer, SBR for ischemic bowel - 07/01/2014 - Derrell LollingIngram  Intra-abdominal (pelvic) fluid collection, s/p CT guided IR Perc aspiration/drainage 08/17/14 - minimal output, cx pending Now with duodenal fistula -CT 08/15/14 showed continued pre-rectal fluid collection. No drain was replaced in this pelvic fluid collection after last CT on 5/2 after previous drain accidentally pulled out. Given persistent WBC, will ask IR to aspirate/place drain and send fluid for culture.  -Fistula study done and shows JP drain near graham patch with connection to the duodenal bulb.  -Strict NPO, TPN. Sandostatin helping? -Wound vac discontinued, now on NS WD dressing changes TID due to significant drainage -I&Os and routine drain care. Keep Blake drain up to suction since such high  volume. Keep G-tube to gravity. -IR completed GJ tube on 08/12/14. Hold trickle TF for now, the jejunal TF were re-gurging up into the stomach into the blake drainage  and out G tube.  Will re-attempt tube feeding tomorrow.The J tube does not look to be coiled up on the CT scan. No overt sign of extravasation of oral contrast on CT yesterday. Monitor drain output. Dietitian following.   Inability to take PO/PCM - hold TF through J tube port for now VTE prophylaxis-SCDs  ID-- WBC 8.4 today.  Wound culture shows gram negative rods, abundant pseudomonas, sensitive to Ceftazidime, cipro, gentamicin, invanz, zosyn, and tobraqmycin. ID following given complexity of recent c.diff and post-op infections, will let them decide about antibiotic coverage. C diff colitis New ESRD PCM/TNA - at some point can wean TNA if she tolerates TF.   She should not be discharged until we can get her better nutritional support.    LOS: 62 days    [name], Ante Arredondo 08/18/2014, 9:23 AM Pager: [phone]

## 2014-08-18 NOTE — Progress Notes (Signed)
[name] GEX:[id number] DOB: [birth year] DOA: 06/17/2014 PCP: Willey BladeEAN, [name], MD   Admit HPI / Brief Narrative: 59 yo ? 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 [name], failed L AV fistula, initially admitted 3/16 with flu and acute hypoxic resp failure. Ultimately tx to [facility] due to need for HD L sided weakness 3/18 and found to have R ACA stroke-showed acute infarct R ACA  Patient ? lethargic 3/30 and CT abd=perf viscus 3/31 was taken to the OR for ex lap for perforated pyloric ulcer with diffuse peritonitis, ischemic necrosis of mid ileum. Patient developed septic shock on 4-1, she was started on pressors and wean off pressors on 4-3.  Subsequently she was diagnosed with pelvic abscess by CT scan perform on 4-7. She underwent pelvic drain placement by IR.  C. diff diagnosed 4-12-initially unresponsive to flagyl, and she was started on vancomycin 4-16.  develops  run of SVT. Cardiology has been helping with management.  Course complicated with high RV pressure ,CT angio negative for PE. - Also with high output fistula formation between anastomosis and drain, making her back to NPO state and in need of TPN. FULL CODE Palliative care consulted--patient remains full code On 5-2 = hypoxemia, hypotension and SVT during dialysis treatment. Dialysis was stop. She received IV bolus. She was started on BIPAP. She was transfer to Step down unit. CCM and cardiology re consulted.Her Hr decreased, BP improved. She was taken off BIPAP. This recurred on 5/15 with episodic SVT Now stable, on TNA + very high output from fistula/leak per [facility] Had a 872f Coax DL G_J tube placed,c tip @ lig Treitz by Dr. [name] of IR and Gen surgery has continued to assist with management of her multiple surgical comorbidities CT repeat 5/14 showed 5 cm abscess-transgluteal drain with 5 ml of purulent fluid removed on 5/17  Every time she is dialyzed she feels nervous because her to sisters  were on dialysis and they died and she goes into SVT She was able to control her symptoms on 5/17 a.m. CIR is interested in taking her if she can stabilize enough from her medical conditions   Polyconsultant roles greatly appreciated    HPI/Subjective:   patient seen earlier this morning on dialysis and was very anxious and redirected. The x-ray performed this morning did not show any acute findings She was pretty anxious and calm down with deep breathing She is currently in the room on step down and seems to be doing a little better Pretty frustrated and wants to go home and wants to take by mouth however understands why she cannot. Overall pain is about the same however has been more mobile despite her drains Daughters in the room and we had a good discussion today about protracted hospital course and likely continued protracted hospital stay  Assessment/Plan: Acute hypoxic Respiratory Failure:  -2nd to H1N1, hypervolemia, HCAP >> resolved.  -also has COPD and PAH -improving, now weaning off O2 -Nebulizer PRN and CPAP QHS   Septic shock  -due to perforated prepyloric ulcer, required pressors -resolved currently  C. difficile colitis still on contact precautions -C diff positive on 4-12, Received 4 days of flagyl without improvement, then started on PO vanc -Completed 3 weeks of Oral Vanc-stopped 5/7, this was 1 week after IV Zosyn was stopped 5/5 -Diarrhea resolved -keep contact precautions on board   Status post exploratory laparotomy with closure of perforated pyloric ulcer/abdominal abscess / small bowel  resection w/ postop ileus and now with fistula  -Pelvic abscess s/p percutaneous drain placement 4/8; had leakage from anastomosis site ? received Zosyn for 23 days.  -chronic low grade leukocytosis range 13-15 -IV antibiotics discontinued by Surgery on 4/22, also Received 18 days of Vancomycin and fluconazole.  -Due to Fistula is back to NPO state, TPN.  -Fistula  study done shows JP drain connection to the duodenal bulb-5-02. - repeat CT abdomen with  3.0 x 3.5 x 5.7 cm collection in the pelvic cul-de-sac and extraluminal air alongside JP drain in anterior abd wall -Drain with old and new blood, Hb trending down, transfuse 2 unit PRBC 5/10 -continues to have high output from Fistula/leak ?-Had 46f Coax DL G_J placed, tip lig Treitz placed by IR 08/12/14 at request of gen surgery -d/w Dr Donell Beers 5/12 whose expertise is greatly appreciated-Will consider ? D/c Octreotide and in the next 7-10 days determine what to do re: J tube feeds with G-tube portion to drain -CT repeat shows 5 cm post cul-de-sac abscess which was drained on 5/16-culture pending  -ID was consulted as patient had Pseudomonas growing from her culture wounds and they started back Flagyl as well as Elita Quick on 5/16 and are following and we await final growth from her new abscess drained on 5/16   Hypokalemia;  -replace in TNA Current order as per RD/PharmD onn 5/16  PSVT/concerns for pulmonary embolism given extremely high heart right side pressure Multiple episoded 5/2 and 5/15 and 5/17  Felt to be secondary to severe anxiety with dialysis   -Has been evaluated by EP, no further episodes for 48h -Cardiology following peripherally- follow K+ and Mg  -Continue IV metoprolol, now off amiodarone;  -V-Q scan with intermediate probability for pulmonary embolism.  -CTA 4/27, negative for PE   -she is hypotensive 2/2 to metoprolol IV but has been stable and hence we did not reconsult cardiology   Small PFO Has been evaluated by Cardiology  ACA CVA with Lt sided weakness -TEE ;normal LV function; no LAA thrombus -Started on ASA  per NEuro, now NPO -Will need ongoing long term rehab - PT/OT to cont to follow while inpatient -Patient remains very weak and deconditioned .  will potentially need CIR? -  ESRD new start HD this admit coml;icated by intermittent hypotension AKi on CKD 4, due to  sepsis, shock Ongoing hemodialysis per Nephrology - perm-cath placed 3/26 d/t AVF problems Patient AV fistula reviewed and not mature yet per Dr. Paulene Floor revisit prior to d/c -she was unable to complete dialysis treatment 5-2 due to hypotension, SVT, -has had some ongoing hypotension-EDW may need to be adjusted as per Nephrology  Chronic diastolic congestive heart failure w/ Severe RHF EF 60-65%, grade 1 diastolic dysfunction. Fluids/volume managed with hemodialysis.  Elevated PA pressures on 2-D echo; CTA negative for PE  Anemia of critical illness/chronic disease -and bleeding from leak/fistula -s/p 2U PRBC 4/13  Transfuse 2 u PRBC 5/10 Aranesp/iron per renal -hb stable 8-9 range  Thrombocytopenia; resolved.  Appears to be associated with sepsis Has had mild positive HIT test during this admission  SCDs for DVT proph  Asthma/COPD Continue oxygen supplementation as needed Will continue pulmicort Continue xopenex CXR 1 view 5/12 non-acute  chest x-ray 1 view on 5/14 and 5/17 while on dialysis and having anxiety attacks showed small pleural effusions  Diabetes mellitus 2 Hypoglycemia in setting of NPO status. On TPN now, CBGs  stabilizedin 120-160s Hbaic 6.5  Abnormal TSH -Free T4 WNL, T3  1.8 -most likely sick thyroid with ongoing infection and body stress; also due to amiodarone use. TSH in 7 range -continue low dose synthroid given PSVT, now on IV synthroid   L Renal mass  -noted on US, Indeterminate 1.9 cm hypoechoic mass off the interpolar region of the left kidney -may potentially represent a cyst however solid mass is not excluded -needs  Outpatient FU of this  Obesity - Inadequate oral intake related to altered GI function as evidenced by limited intake, ongoing.  Body mass index is 34.18 kg/(m^2). -patient weight trending down -patient unable to tolerate diet and with leaking fistula  Code Status: FULL Family Communication:  discussed with family at the  bedside  Disposition Plan: unclear-maybe CIR soon  Consultants: Cardiology Nephrology PCCM Gen Surgery  Palliative care  Significant Events: 3/16 influenza A+ 3/18 R ACA stroke 3/19 MRI multifocal acute infarction of both hemispheres, large right distal ACA 3/22 TEE severe RAE, severely reduced RV function, severe TR, no LAA thrombus, small PFO on TEE 3/31 to OR ex lap for per pyloric ulcer and sm bowel resection 4/1 septic shock on pressors 4/3 pressors weaned off  4/4 restart pressors, CRRT 4/6 levo switched to neo 4/7 pelvic abscess on CT 4/8 IR placed pelvic drain 4/9 off pressors 4/11 long run of SVT. 4/23 VQ scan which demonstrated intermediate probability for PE 4/24 CT angiogram of her chest: Pending/unable to be done due to lack of access   Antibiotics: Zosyn >stopped on 4/22 (received a total of 23 days of treatment ) Vancomycin 4-16  DVT prophylaxis: SCDs  Objective: Blood pressure 90/58, pulse 104, temperature 97.4 F (36.3 C), temperature source Oral, resp. rate 16, height 5\' 6"  (1.676 m), weight 96 kg (211 lb 10.3 oz), SpO2 83 %.  Intake/Output Summary (Last 24 hours) at 08/18/14 1700 Last data filed at 08/18/14 1354  Gross per 24 hour  Intake   1900 ml  Output    150 ml  Net   1750 ml   Exam: General: AAOx3, tired coherent Lungs: ? AE, HD catheter Cardiovascular: Regular rate, no murmurs and no gallops, sinus tachycardia  Abdomen: Abdominal wound dressing noted-wounds not examined today--last examined on 5/16 looked clean without any slough mild grapelike odor of Pseudomonas   Data Reviewed: Basic Metabolic Panel:  Recent Labs Lab 08/14/14 0615 08/14/14 1029 08/15/14 0430 08/16/14 0641 08/17/14 0430 08/18/14 0449  NA 128* 134* 131* 132* 131* 129*  K 4.8 3.7 3.0* 4.4 4.2 4.5  CL 93* 98* 98* 95* 95* 93*  CO2 26 26 23 28 26 24   GLUCOSE 649* 125* 128* 127* 68 125*  BUN 43* 50* 66* 45* 71* 98*  CREATININE 3.33* 3.59* 4.23* 3.59* 4.80*  5.84*  CALCIUM 8.0* 8.2* 7.4* 7.6* 7.9* 7.7*  MG 2.7* 2.5* 2.3 2.2 2.3  --   PHOS 6.5* 4.8* 3.8 4.0 6.2*  --     Liver Function Tests:  Recent Labs Lab 08/12/14 0406 08/13/14 0322 08/14/14 0615 08/17/14 0430 08/18/14 0449  AST  --  23 21 29 24   ALT  --  14 10* 9* 8*  ALKPHOS  --  170* 157* 162* 163*  BILITOT  --  0.8 1.2 1.1 1.2  PROT  --  6.9 6.2* 6.3* 6.2*  ALBUMIN 1.7* 1.7* 1.5* 1.6* 1.5*   CBC:  Recent Labs Lab 08/14/14 0615 08/15/14 1045 08/16/14 0641 08/17/14 0430 08/18/14 0449  WBC 15.4* 14.4* 16.0* 9.8 8.4  NEUTROABS  --   --   --  7.3  --   HGB 8.3* 9.2* 8.6* 11.3* 12.2  HCT 27.7* 29.6* 28.2* 36.3 38.1  MCV 91.1 88.1 90.4 90.5 88.4  PLT 223 276 250 203 185    CBG:  Recent Labs Lab 08/17/14 1159 08/17/14 1544 08/18/14 0026 08/18/14 0617 08/18/14 1258  GLUCAP 162* 112* 116* 136* 137*    Scheduled Meds:  Scheduled Meds: . antiseptic oral rinse  7 mL Mouth Rinse q12n4p  . budesonide (PULMICORT) nebulizer solution  0.25 mg Nebulization BID  . cefTAZidime (FORTAZ)  IV  2 g Intravenous Q T,Th,Sa-HD  . chlorhexidine  15 mL Mouth Rinse BID  . ferric gluconate (FERRLECIT/NULECIT) IV  125 mg Intravenous Q T,Th,Sa-HD  . insulin aspart  0-9 Units Subcutaneous 4 times per day  . levothyroxine  12.5 mcg Intravenous Daily  . metoprolol  5 mg Intravenous Q4H  . metronidazole  500 mg Intravenous 3 times per day  . sodium chloride  10-40 mL Intracatheter Q12H    Time spent on care of this patient:   Pleas Koch, MD [facility] ([phone]   If 7PM-7AM, please contact night-coverage [email or web address] Password [email or web address] 08/18/2014, 5:00 PM   LOS: 62 days

## 2014-08-18 NOTE — Progress Notes (Signed)
  [facility] Progress Note   Subjective: no complaints   Filed Vitals:   08/18/14 0734 08/18/14 0739 08/18/14 0800 08/18/14 0830  BP: 141/119  90/67 98/80  Pulse: 96  100 100  Temp:      TempSrc:      Resp: 16  20 20   Height:      Weight:      SpO2: 97% 97% 99%    Exam: Alert, no distress No jvd Chest clear bilat RRR no MRG Abd large dressing, PEG tube in place, drain RUQ No LE or UE edema Neuro is alert ox 3 HD cath  HD: new start        Assessment: 1. Anxiety - get CXR r/o edema 2. Pelvic fluid s/p drain 5/16 3. CVA 4. Perf pyloric ulcer SP exlap, w duod fistula 5. C diff 6. Anemia on darbe 7. ESRD new start TTS for now 8. HD access needs revision of AVF when healthier 9. HypoPO4, resolved 10. 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  Plan - HD today, get cxr    Vinson Moselleob Cherilynn Schomburg MD  pager [phone]    cell [phone]  08/18/2014, 9:22 AM     Recent Labs Lab 08/15/14 0430 08/16/14 0641 08/17/14 0430 08/18/14 0449  NA 131* 132* 131* 129*  K 3.0* 4.4 4.2 4.5  CL 98* 95* 95* 93*  CO2 23 28 26 24   GLUCOSE 128* 127* 68 125*  BUN 66* 45* 71* 98*  CREATININE 4.23* 3.59* 4.80* 5.84*  CALCIUM 7.4* 7.6* 7.9* 7.7*  PHOS 3.8 4.0 6.2*  --     Recent Labs Lab 08/14/14 0615 08/17/14 0430 08/18/14 0449  AST 21 29 24   ALT 10* 9* 8*  ALKPHOS 157* 162* 163*  BILITOT 1.2 1.1 1.2  PROT 6.2* 6.3* 6.2*  ALBUMIN 1.5* 1.6* 1.5*    Recent Labs Lab 08/16/14 0641 08/17/14 0430 08/18/14 0449  WBC 16.0* 9.8 8.4  NEUTROABS  --  7.3  --   HGB 8.6* 11.3* 12.2  HCT 28.2* 36.3 38.1  MCV 90.4 90.5 88.4  PLT 250 203 185   . antiseptic oral rinse  7 mL Mouth Rinse q12n4p  . budesonide (PULMICORT) nebulizer solution  0.25 mg Nebulization BID  . chlorhexidine  15 mL Mouth Rinse BID  . darbepoetin (ARANESP) injection - DIALYSIS  200 mcg Intravenous Q Tue-HD  . ferric gluconate (FERRLECIT/NULECIT) IV  125 mg Intravenous Q T,Th,Sa-HD  . insulin  aspart  0-9 Units Subcutaneous 4 times per day  . levothyroxine  12.5 mcg Intravenous Daily  . metoprolol  5 mg Intravenous Q4H  . sodium chloride  10-40 mL Intracatheter Q12H   . sodium chloride 10 mL/hr at 08/10/14 2300  . TPN (CLINIMIX) Adult without lytes 100 mL/hr at 08/17/14 1745   And  . fat emulsion 240 mL (08/17/14 1745)  . feeding supplement (VITAL AF 1.2 CAL) Stopped (08/15/14 1030)  . octreotide  (SANDOSTATIN)    IV infusion 50 mcg/hr (08/18/14 0148)   bisacodyl, camphor-menthol, diphenhydrAMINE, diphenhydrAMINE-zinc acetate, HYDROmorphone (DILAUDID) injection, levalbuterol, ondansetron (ZOFRAN) IV, sodium chloride

## 2014-08-18 NOTE — Progress Notes (Addendum)
PARENTERAL NUTRITION + ANTIMICROBIAL CONSULT  Pharmacy Consult for TPN, Ceftazidime  Indication:  High output Duodenal Fistula, Wound infection  No Known Allergies  Patient Measurements: Height: 5\' 6"  (167.6 cm) Weight: 211 lb 10.3 oz (96 kg) IBW/kg (Calculated) : 59.3 Adjusted Body Weight: 71.2kg  Vital Signs: Temp: 97.8 F (36.6 C) (05/17 0727) Temp Source: Oral (05/17 0727) BP: 98/80 mmHg (05/17 0830) Pulse Rate: 100 (05/17 0830) Intake/Output from previous day: 05/16 0701 - 05/17 0700 In: 3315 [I.V.:625; TPN:2640] Out: 450 [Drains:450]  Labs:  Recent Labs  08/16/14 0641 08/17/14 0430 08/18/14 0449  WBC 16.0* 9.8 8.4  HGB 8.6* 11.3* 12.2  HCT 28.2* 36.3 38.1  PLT 250 203 185    Recent Labs  08/16/14 0641 08/17/14 0430 08/18/14 0449  NA 132* 131* 129*  K 4.4 4.2 4.5  CL 95* 95* 93*  CO2 28 26 24   GLUCOSE 127* 68 125*  BUN 45* 71* 98*  CREATININE 3.59* 4.80* 5.84*  CALCIUM 7.6* 7.9* 7.7*  MG 2.2 2.3  --   PHOS 4.0 6.2*  --   PROT  --  6.3* 6.2*  ALBUMIN  --  1.6* 1.5*  AST  --  29 24  ALT  --  9* 8*  ALKPHOS  --  162* 163*  BILITOT  --  1.1 1.2  PREALBUMIN  --  7.9*  --   TRIG  --  142  --    Estimated Creatinine Clearance: 12.3 mL/min (by C-G formula based on Cr of 5.84).   Recent Labs  08/17/14 1544 08/18/14 0026 08/18/14 0617  GLUCAP 112* 116* 136*   Insulin Requirements in the past 12 hours:  3 unit Novolog SSI  Current Nutrition:  Clinimix 5/15 at 181ml/hr and 20% IVFE at 69ml/hr (at goal)  Nutritional Goals: per RD assessment 5/12 2100-2300 kCal, 120-140 grams of protein per day Goal: Clinimix 5/15 at 193ml/hr + 20% IVFE 51ml/hr to provide 2184 kcal and 120 gm protein  Assessment: 53 YOF with complicated hospital course. S/p closure perforated pyloric ulcer, SBR for ischemic necrosis, diffuse peritonitis on 07/01/14. Pharmacy consulted to provide TPN 4/1-4/15 for nutrition support for prolonged ileus. Pt then transitioned to po  diet + supplements. 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  GI: Duodenal fistula. NPO. Open abd wound, (+)abd pain. Octreotide drip ordered 5/10 am. Albumin 1.7. Prealbumin remains low at 7.9. Trickle feeds started 5/12, +flatus, on 5/14 AM TFs were noted to be leaking out of blake drain and G-tube - TF has been held since. Abdominal drain output/24h: 450 cc (reduced from previous). Minimal output s/p pelvic aspiration/drainage on 5/16    5/10 Add octreotide drip 5/11 IR placed gastro-jenjunal tube for post-pyloric feeding and drainage of gastric juices 5/12 to start Vital AF 1.2 trickle feedings via J tube at 10 ml/hr 5/13 Continues on trickle feeds at 10 ml/hr 5/14: Possible leak in GJ tube - TFs held. Imaging showed no change in JP position with tips in proper positions however pt still with pre-rectal fluid collection 5/15: TFs still being held. IR to replace drain for fluid collection 5/16: IR percutaneous placement of drain in pelvis with aspiration/drainage   Endo: Hx DM/hypothyroidism. Had low CBGs prior to TPN start, current CBGs/24h: 68-160. No insulin in TPN, on sensitive SSI, synthroid (new) TSH 7.3  Lytes: Lytes being added and removed to the TPN bag as needed based on labs. Na 131 > 129, K 4.5, Phos 6.2,  Mg 2.3, CoCa 9.7.  Per discussion with renal-pharmacy to manage K - no additional K needed today. Ca x Phos = 60  Renal: New ESRD - started 3/17. HD on TTS.Marland Kitchen Last HD on 5/14 (3.5 hr BFR 250) - poor tolerance d/t CP and tachycardia, rapid response called, anticipate HD today   Heme: Anemia of ESRD. Hgb 8.6 > 12.2, plts wnl, on Aranesp 200 mcg-Tues. On Nulecit/HD  Pulm: Hx asthma/COPD. RA. CT negative for PE  Cards: HTN. (afib).  Midodrine and amio on hold (no NGT and NPO) IV metoprolol. CHADS-VASc =4 (not good candidate for [facility]). 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   Hepatotobil: Alk phos elevated, AST/ALT,  Tbili wnl, TG 142 (5/16)  Neuro: Hx depression / insomnia - new stroke noted on 3/18.   ID: Afebrile, WBC 16 > 8.4, s/p treatment for CDiff and s/p course of fluconazole, IV vanc, zosyn.  Hep B Ag pending 5/14 wound cx (abdomen): pseudomonas  - ID following  Best Practices: SCDs, PPI IV  TPN Access: PICC line in IR 4/27  TPN start date: 4/1 >> 4/15; restart 4/27>>  Plan:  - Continue Clinimix (without lytes) 5/15 at goal rate of 100 ml/hr  + IVFE at 10 ml/hr - MV + TE in TPN - Continue sensitive SSI - Will f/u plans to resume TFs, possibly tomorrow, wean TPN to half tomorrow? - Watch Ca x Phos product - F/u duration of octreotide infusion - Pharmacy managing K replacemnt - no additional K needed today - Hold Aranesp?    Hughes Better, PharmD, BCPS Clinical Pharmacist Pager: [phone] 08/18/2014 8:55 AM    Addendum  -Initiating ceftazidime for pseudomonas in abdominal wound -Pt has been admitted since March, has had multiple courses of abx, notably, fluconazole, zosyn, IV vanc, and PO vanc for cdiff -Flagyl is being resumed as well -Initiating ceftazidime 2 g IV qHD-TuThSat -F/u cultures from pelvic aspiration yesterday   Harvel Quale  08/18/2014 11:00 AM

## 2014-08-18 NOTE — Progress Notes (Signed)
Rehab admissions - Continuing to follow.  Prior to consideration for inpatient rehab admission, patient will need to be clipped.  She will also need to be off TNA, on a diet and me medically ready for discharge to rehab.  Call me for questions.  #[phone]

## 2014-08-19 DIAGNOSIS — Z992 Dependence on renal dialysis: Secondary | ICD-10-CM

## 2014-08-19 LAB — COMPREHENSIVE METABOLIC PANEL
ALBUMIN: 1.6 g/dL — AB (ref 3.5–5.0)
ALK PHOS: 140 U/L — AB (ref 38–126)
ALK PHOS: 156 U/L — AB (ref 38–126)
ALT: 11 U/L — AB (ref 14–54)
ALT: 12 U/L — ABNORMAL LOW (ref 14–54)
ANION GAP: 7 (ref 5–15)
AST: 28 U/L (ref 15–41)
AST: 31 U/L (ref 15–41)
Albumin: 1.4 g/dL — ABNORMAL LOW (ref 3.5–5.0)
Anion gap: 8 (ref 5–15)
BILIRUBIN TOTAL: 1 mg/dL (ref 0.3–1.2)
BUN: 45 mg/dL — ABNORMAL HIGH (ref 6–20)
BUN: 50 mg/dL — ABNORMAL HIGH (ref 6–20)
CALCIUM: 7.5 mg/dL — AB (ref 8.9–10.3)
CO2: 26 mmol/L (ref 22–32)
CO2: 28 mmol/L (ref 22–32)
Calcium: 7.3 mg/dL — ABNORMAL LOW (ref 8.9–10.3)
Chloride: 90 mmol/L — ABNORMAL LOW (ref 101–111)
Chloride: 96 mmol/L — ABNORMAL LOW (ref 101–111)
Creatinine, Ser: 3.53 mg/dL — ABNORMAL HIGH (ref 0.44–1.00)
Creatinine, Ser: 3.81 mg/dL — ABNORMAL HIGH (ref 0.44–1.00)
GFR calc Af Amer: 15 mL/min — ABNORMAL LOW (ref >60)
GFR calc non Af Amer: 12 mL/min — ABNORMAL LOW (ref >60)
GFR, EST AFRICAN AMERICAN: 14 mL/min — AB (ref >60)
GFR, EST NON AFRICAN AMERICAN: 13 mL/min — AB (ref >60)
Glucose, Bld: 734 mg/dL (ref 65–99)
Glucose, Bld: 119 mg/dL — ABNORMAL HIGH (ref 65–99)
Potassium: 3.3 mmol/L — ABNORMAL LOW (ref 3.5–5.1)
Potassium: 3.4 mmol/L — ABNORMAL LOW (ref 3.5–5.1)
Sodium: 123 mmol/L — ABNORMAL LOW (ref 135–145)
Sodium: 132 mmol/L — ABNORMAL LOW (ref 135–145)
Total Bilirubin: 1.1 mg/dL (ref 0.3–1.2)
Total Protein: 5.8 g/dL — ABNORMAL LOW (ref 6.5–8.1)
Total Protein: 6.2 g/dL — ABNORMAL LOW (ref 6.5–8.1)

## 2014-08-19 LAB — CBC WITH DIFFERENTIAL/PLATELET
Basophils Absolute: 0 10*3/uL (ref 0.0–0.1)
Basophils Relative: 0 % (ref 0–1)
EOS ABS: 0.5 10*3/uL (ref 0.0–0.7)
Eosinophils Relative: 4 % (ref 0–5)
HEMATOCRIT: 26.1 % — AB (ref 36.0–46.0)
HEMOGLOBIN: 8 g/dL — AB (ref 12.0–15.0)
LYMPHS ABS: 1.3 10*3/uL (ref 0.7–4.0)
LYMPHS PCT: 11 % — AB (ref 12–46)
MCH: 27.7 pg (ref 26.0–34.0)
MCHC: 30.7 g/dL (ref 30.0–36.0)
MCV: 90.3 fL (ref 78.0–100.0)
Monocytes Absolute: 1.4 10*3/uL — ABNORMAL HIGH (ref 0.1–1.0)
Monocytes Relative: 12 % (ref 3–12)
NEUTROS ABS: 8.5 10*3/uL — AB (ref 1.7–7.7)
Neutrophils Relative %: 73 % (ref 43–77)
Platelets: 240 10*3/uL (ref 150–400)
RBC: 2.89 MIL/uL — ABNORMAL LOW (ref 3.87–5.11)
RDW: 19.4 % — ABNORMAL HIGH (ref 11.5–15.5)
WBC: 11.7 10*3/uL — ABNORMAL HIGH (ref 4.0–10.5)

## 2014-08-19 LAB — HIV ANTIBODY (ROUTINE TESTING W REFLEX): HIV Screen 4th Generation wRfx: NONREACTIVE

## 2014-08-19 LAB — GLUCOSE, CAPILLARY
GLUCOSE-CAPILLARY: 134 mg/dL — AB (ref 65–99)
GLUCOSE-CAPILLARY: 148 mg/dL — AB (ref 65–99)
Glucose-Capillary: 131 mg/dL — ABNORMAL HIGH (ref 65–99)
Glucose-Capillary: 123 mg/dL — ABNORMAL HIGH (ref 65–99)

## 2014-08-19 MED ORDER — FAT EMULSION 20 % IV EMUL
240.0000 mL | INTRAVENOUS | Status: AC
  Administered 2014-08-19: 240 mL via INTRAVENOUS
  Filled 2014-08-19: qty 250

## 2014-08-19 MED ORDER — ASCORBIC ACID 500 MG/ML IJ SOLN
1000.0000 mg | Freq: Every day | INTRAMUSCULAR | Status: DC
  Filled 2014-08-19: qty 2

## 2014-08-19 MED ORDER — TRACE MINERALS CR-CU-F-FE-I-MN-MO-SE-ZN IV SOLN
INTRAVENOUS | Status: AC
  Administered 2014-08-19: 18:00:00 via INTRAVENOUS
  Filled 2014-08-19: qty 2400

## 2014-08-19 MED ORDER — TRACE MINERALS CR-CU-F-FE-I-MN-MO-SE-ZN IV SOLN
INTRAVENOUS | Status: DC
  Filled 2014-08-19: qty 2400

## 2014-08-19 MED ORDER — ASCORBIC ACID 500 MG/ML IJ SOLN
1000.0000 mg | Freq: Two times a day (BID) | INTRAMUSCULAR | Status: DC
  Filled 2014-08-19 (×2): qty 2

## 2014-08-19 MED ORDER — FAT EMULSION 20 % IV EMUL
240.0000 mL | INTRAVENOUS | Status: DC
  Filled 2014-08-19: qty 250

## 2014-08-19 MED ORDER — POTASSIUM CHLORIDE 10 MEQ/50ML IV SOLN
10.0000 meq | INTRAVENOUS | Status: AC
  Administered 2014-08-19 (×4): 10 meq via INTRAVENOUS
  Filled 2014-08-19 (×4): qty 50

## 2014-08-19 NOTE — Progress Notes (Signed)
[facility] Progress Note  49 Days Post-Op  Subjective: Pt looks depressed today.  No N/V, pain improved with dressing changes today.  C/o pain at right buttock drain pain.  Had a BM yesterday per patient.  Objective: Vital signs in last 24 hours: Temp:  [97.3 F (36.3 C)-99.2 F (37.3 C)] 98.4 F (36.9 C) (05/18 0333) Pulse Rate:  [96-142] 103 (05/18 0400) Resp:  [14-22] 22 (05/18 0400) BP: (90-144)/(41-119) 114/64 mmHg (05/18 0400) SpO2:  [83 %-100 %] 99 % (05/18 0400) Weight:  [99 kg (218 lb 4.1 oz)] 99 kg (218 lb 4.1 oz) (05/18 0500) Last BM Date: 08/15/14  Intake/Output from previous day: 05/17 0701 - 05/18 0700 In: 785 [I.V.:585; IV Piggyback:200] Out: 950 [Drains:1150] Intake/Output this shift:    PE: Gen:  Alert, NAD, pleasant Abd: Soft, mildly tender, ND, +BS, no HSM, midline wound with yellow slough at base, no further purulent drainage at inferior aspect, some wound dehiscence at inferior aspect, sutures exposed, some granulation tissue formation at edges, Blake drain in Right abdomen with 117050mL/24hr bilious, bilious output in G-tube drainage, perc IR drain in right glute with minimal output. Ext:  No erythema, edema, or tenderness, dry skin   Lab Results:   Recent Labs  08/17/14 0430 08/18/14 0449  WBC 9.8 8.4  HGB 11.3* 12.2  HCT 36.3 38.1  PLT 203 185   BMET  Recent Labs  08/18/14 0449 08/19/14 0551  NA 129* 123*  K 4.5 3.3*  CL 93* 90*  CO2 24 26  GLUCOSE 125* 734*  BUN 98* 45*  CREATININE 5.84* 3.53*  CALCIUM 7.7* 7.3*   PT/INR No results for input(s): LABPROT, INR in the last 72 hours. CMP     Component Value Date/Time   NA 123* 08/19/2014 0551   NA 139 10/02/2012 1046   K 3.3* 08/19/2014 0551   K 4.4 10/02/2012 1046   CL 90* 08/19/2014 0551   CO2 26 08/19/2014 0551   CO2 25 10/02/2012 1046   GLUCOSE 734* 08/19/2014 0551   GLUCOSE 110 10/02/2012 1046   BUN 45* 08/19/2014 0551   BUN 35.2* 10/02/2012 1046    CREATININE 3.53* 08/19/2014 0551   CREATININE 2.5* 10/02/2012 1046   CALCIUM 7.3* 08/19/2014 0551   CALCIUM 9.8 10/02/2012 1046   PROT 5.8* 08/19/2014 0551   PROT 8.4* 10/02/2012 1046   ALBUMIN 1.4* 08/19/2014 0551   ALBUMIN 3.1* 10/02/2012 1046   AST 28 08/19/2014 0551   AST 9 10/02/2012 1046   ALT 11* 08/19/2014 0551   ALT <6 Repeated and Verified 10/02/2012 1046   ALKPHOS 140* 08/19/2014 0551   ALKPHOS 108 10/02/2012 1046   BILITOT 1.0 08/19/2014 0551   BILITOT 0.21 10/02/2012 1046   GFRNONAA 13* 08/19/2014 0551   GFRAA 15* 08/19/2014 0551   Lipase  No results found for: LIPASE     Studies/Results: Dg Chest Port 1 View  08/18/2014   CLINICAL DATA:  Shortness of breath at rest.  Having hemodialysis.  EXAM: PORTABLE CHEST - 1 VIEW  COMPARISON:  08/15/2014.  FINDINGS: The cardiac silhouette remains mildly enlarged. Stable right jugular double-lumen catheter. Clear lungs. The pulmonary vasculature remains prominent. No pleural fluid. Minimal right shoulder degenerative changes.  IMPRESSION: Stable cardiomegaly and pulmonary vascular congestion.   Electronically Signed   By: Beckie SaltsSteven  Reid M.D.   On: 08/18/2014 09:47   Ct Image Guided Fluid Drain By Catheter  08/17/2014   CLINICAL DATA:  Postoperative intra-abdominal abscess, subsequent encounter. Small pelvic fluid  collection.  EXAM: CT-GUIDED DRAIN PLACEMENT IN PELVIC FLUID COLLECTION  Physician: Rachelle Hora. Lowella Dandy, MD  FLUOROSCOPY TIME:  None  MEDICATIONS: 50 mcg fentanyl  ANESTHESIA/SEDATION: Patient was monitored by a radiology nurse during the procedure.  PROCEDURE: Informed consent was obtained for drain placement. Patient was placed on her left side. Images through the pelvis were obtained. The right buttock was prepped and draped in sterile fashion. 1% lidocaine was used for a local anesthetic. An 18 gauge needle was directed into the pelvic fluid collection with CT guidance from a transgluteal approach. Thick yellow purulent fluid was  aspirated. A stiff Amplatz wire was placed. The tract was dilated to accommodate a 10.2 [place] multipurpose drain. It was technically difficult to advance the drain into the collection due to the small size of the collection and the patient's body habitus. Eventually, the drain was successfully placed within the collection. 5 mL of yellow purulent fluid was obtained. Catheter was sutured to the skin and attached to a suction bulb. Fluid was sent for culture.  FINDINGS: Small fluid collection just posterior to the uterus. A total of 5 mL of yellow purulent fluid was removed.  Estimated blood loss: Minimal  COMPLICATIONS: None  IMPRESSION: CT-guided placement of a drainage catheter in the small pelvic fluid collection.   Electronically Signed   By: Richarda Overlie M.D.   On: 08/17/2014 17:21    Anti-infectives: Anti-infectives    Start     Dose/Rate Route Frequency Ordered Stop   08/18/14 1300  metroNIDAZOLE (FLAGYL) IVPB 500 mg     500 mg 100 mL/hr over 60 Minutes Intravenous 3 times per day 08/18/14 1045     08/18/14 1200  cefTAZidime (FORTAZ) 2 g in dextrose 5 % 50 mL IVPB     2 g 100 mL/hr over 30 Minutes Intravenous Every T-Th-Sa (Hemodialysis) 08/18/14 1054     08/12/14 1449  ceFAZolin (ANCEF) 2-3 GM-% IVPB SOLR    Comments:  Troy Sine   : cabinet override      08/12/14 1449 08/12/14 1629   08/12/14 1130  ceFAZolin (ANCEF) IVPB 2 g/50 mL premix    Comments:  Hang ON CALL to xray 5/11   2 g 100 mL/hr over 30 Minutes Intravenous To Radiology 08/12/14 1035 08/12/14 1520   08/04/14 1200  vancomycin (VANCOCIN) 50 mg/mL oral solution 125 mg  Status:  Discontinued     125 mg Oral 4 times per day 08/04/14 0851 08/08/14 1229   07/18/14 1800  vancomycin (VANCOCIN) 50 mg/mL oral solution 125 mg  Status:  Discontinued     125 mg Oral 4 times per day 07/18/14 1455 08/04/14 0845   07/17/14 1830  vancomycin (VANCOCIN) IVPB 750 mg/150 ml premix     750 mg 150 mL/hr over 60 Minutes Intravenous  Once  07/17/14 1818 07/18/14 0024   07/15/14 1400  metroNIDAZOLE (FLAGYL) tablet 500 mg  Status:  Discontinued     500 mg Oral 3 times per day 07/15/14 1202 07/18/14 1533   07/13/14 2200  piperacillin-tazobactam (ZOSYN) IVPB 2.25 g  Status:  Discontinued     2.25 g 100 mL/hr over 30 Minutes Intravenous 3 times per day 07/13/14 1318 07/24/14 1138   07/13/14 2000  fluconazole (DIFLUCAN) IVPB 200 mg  Status:  Discontinued     200 mg 100 mL/hr over 60 Minutes Intravenous Every 24 hours 07/13/14 1318 07/19/14 1027   07/07/14 1200  vancomycin (VANCOCIN) IVPB 1000 mg/200 mL premix  Status:  Discontinued  1,000 mg 200 mL/hr over 60 Minutes Intravenous Every 24 hours 07/07/14 0937 07/14/14 1712   07/06/14 2000  fluconazole (DIFLUCAN) IVPB 400 mg  Status:  Discontinued     400 mg 100 mL/hr over 120 Minutes Intravenous Every 24 hours 07/06/14 1507 07/13/14 1318   07/06/14 1800  piperacillin-tazobactam (ZOSYN) IVPB 2.25 g  Status:  Discontinued     2.25 g 100 mL/hr over 30 Minutes Intravenous 4 times per day 07/06/14 1505 07/13/14 1318   07/05/14 2000  fluconazole (DIFLUCAN) IVPB 200 mg  Status:  Discontinued     200 mg 100 mL/hr over 60 Minutes Intravenous Every 24 hours 07/05/14 0757 07/06/14 1507   07/05/14 1400  piperacillin-tazobactam (ZOSYN) IVPB 2.25 g  Status:  Discontinued     2.25 g 100 mL/hr over 30 Minutes Intravenous 3 times per day 07/05/14 0749 07/06/14 1505   07/02/14 1800  vancomycin (VANCOCIN) IVPB 1000 mg/200 mL premix  Status:  Discontinued     1,000 mg 200 mL/hr over 60 Minutes Intravenous Every 24 hours 07/01/14 1858 07/05/14 0801   07/01/14 2200  piperacillin-tazobactam (ZOSYN) IVPB 3.375 g  Status:  Discontinued     3.375 g 100 mL/hr over 30 Minutes Intravenous 4 times per day 07/01/14 1858 07/05/14 0749   07/01/14 2000  fluconazole (DIFLUCAN) IVPB 400 mg  Status:  Discontinued     400 mg 100 mL/hr over 120 Minutes Intravenous Every 24 hours 07/01/14 1858 07/05/14 0757    07/01/14 1400  fluconazole (DIFLUCAN) IVPB 200 mg  Status:  Discontinued     200 mg 100 mL/hr over 60 Minutes Intravenous Every 24 hours 07/01/14 1330 07/01/14 1853   07/01/14 1000  piperacillin-tazobactam (ZOSYN) IVPB 2.25 g  Status:  Discontinued     2.25 g 100 mL/hr over 30 Minutes Intravenous Every 8 hours 07/01/14 0952 07/01/14 1853   07/01/14 1000  vancomycin (VANCOCIN) 500 mg in sodium chloride 0.9 % 100 mL IVPB     500 mg 100 mL/hr over 60 Minutes Intravenous  Once 07/01/14 0952 07/01/14 1126   06/30/14 1200  vancomycin (VANCOCIN) IVPB 1000 mg/200 mL premix     1,000 mg 200 mL/hr over 60 Minutes Intravenous  Once 06/30/14 0944 06/30/14 1403   06/30/14 1200  ceFEPIme (MAXIPIME) 2 g in dextrose 5 % 50 mL IVPB     2 g 100 mL/hr over 30 Minutes Intravenous  Once 06/30/14 0944 06/30/14 1425   06/30/14 0100  vancomycin (VANCOCIN) 2,000 mg in sodium chloride 0.9 % 500 mL IVPB     2,000 mg 250 mL/hr over 120 Minutes Intravenous  Once 06/30/14 0048 06/30/14 0525   06/30/14 0100  ceFEPIme (MAXIPIME) 2 g in dextrose 5 % 50 mL IVPB     2 g 100 mL/hr over 30 Minutes Intravenous  Once 06/30/14 0048 06/30/14 0322   06/27/14 0600  cefUROXime (ZINACEF) 1.5 g in dextrose 5 % 50 mL IVPB     1.5 g 100 mL/hr over 30 Minutes Intravenous On call to O.R. 06/26/14 1019 06/27/14 0630   06/19/14 1800  oseltamivir (TAMIFLU) capsule 30 mg     30 mg Oral Every M-W-F (1800) 06/19/14 1003 06/22/14 1841   06/18/14 1600  oseltamivir (TAMIFLU) capsule 30 mg  Status:  Discontinued     30 mg Oral Daily 06/18/14 1538 06/19/14 1003       Assessment/Plan POD #49 s/p Exploratory Laparotomy with graham patch closure of perforated pyloric ulcer, SBR for ischemic bowel - 07/01/2014 - Derrell LollingIngram  Intra-abdominal (pelvic) fluid collection, s/p CT guided IR Perc aspiration/drainage 08/17/14 - minimal output, cx pending Now with duodenal fistula -CT 08/15/14 showed continued pre-rectal fluid collection. No drain was replaced  in this pelvic fluid collection after last CT on 5/2 after previous drain accidentally pulled out. Given persistent WBC, will ask IR to aspirate/place drain and send fluid for culture.  -Fistula study done and shows JP drain near graham patch with connection to the duodenal bulb.  -Strict NPO, TPN. Sandostatin helping? Question going up on the dosage? -TID WD dressing changes to midline wound due to significant drainage/slough -I&Os and routine drain care. Keep Blake drain up to suction since such high volume. Keep G-tube to gravity. -IR completed GJ tube on 08/12/14. Re-attempt trickle TF in J tube.The J tube does not look to be coiled up on the CT scan. No overt sign of extravasation of oral contrast on CT yesterday. Monitor drain output. Dietitian following.   Inability to take PO/PCM - Restart trickle TF through J tube port only, keep g tube to gravity, watch for change in output VTE prophylaxis-SCDs  ID-- WBC 8.4 yesterday. Wound culture shows abundant pseudomonas, ID following but unsure of significance of superficial pseudomonas infection.  They recommended resuming flagyl and ceftaz with HD.  Will await pelvic IR aspiration results. C diff colitis New ESRD PCM/TNA - at some point can wean TNA if she tolerates TF.  Code status - family has wanted everything done up to this point, palliative following  She should not be discharged until we can get her better nutritional support. Consider discharge to CIR who is interested in her at some point.    LOS: 63 days    DORT, Aundra Millet 08/19/2014, 7:25 AM Pager: [phone]

## 2014-08-19 NOTE — Progress Notes (Signed)
INFECTIOUS DISEASE PROGRESS NOTE  ID: [name] is a 59 y.o. female with  Principal Problem:   Acute respiratory failure with hypoxia Active Problems:   Cerebral thrombosis with cerebral infarction   Hypertension   ESRD needing dialysis   Obesity (BMI 30-39.9)   Depression   Left renal mass   Chronic diastolic heart failure   H1N1 influenza   Tobacco use disorder   Renal failure (ARF), acute on chronic   Hyperlipidemia   PSVT (paroxysmal supraventricular tachycardia)   SOB (shortness of breath)   Perforated gastric ulcer   Abnormal TSH   History of intermediate V/Q scan   HIT (heparin-induced thrombocytopenia)   Fistula   Abdominal pain   Palliative care encounter   ESRD (end stage renal disease)   SVT (supraventricular tachycardia)   Right heart failure   Duodenal anastomotic leak   Protein calorie malnutrition   Itching   Aortic aneurysm   Protein-calorie malnutrition  Subjective: Up in chair, no complaints  Abtx:  Anti-infectives    Start     Dose/Rate Route Frequency Ordered Stop   08/18/14 1300  metroNIDAZOLE (FLAGYL) IVPB 500 mg     500 mg 100 mL/hr over 60 Minutes Intravenous 3 times per day 08/18/14 1045     08/18/14 1200  cefTAZidime (FORTAZ) 2 g in dextrose 5 % 50 mL IVPB     2 g 100 mL/hr over 30 Minutes Intravenous Every T-Th-Sa (Hemodialysis) 08/18/14 1054     08/12/14 1449  ceFAZolin (ANCEF) 2-3 GM-% IVPB SOLR    Comments:  Troy Sine   : cabinet override      08/12/14 1449 08/12/14 1629   08/12/14 1130  ceFAZolin (ANCEF) IVPB 2 g/50 mL premix    Comments:  Hang ON CALL to xray 5/11   2 g 100 mL/hr over 30 Minutes Intravenous To Radiology 08/12/14 1035 08/12/14 1520   08/04/14 1200  vancomycin (VANCOCIN) 50 mg/mL oral solution 125 mg  Status:  Discontinued     125 mg Oral 4 times per day 08/04/14 0851 08/08/14 1229   07/18/14 1800  vancomycin (VANCOCIN) 50 mg/mL oral solution 125 mg  Status:  Discontinued     125 mg Oral 4 times per  day 07/18/14 1455 08/04/14 0845   07/17/14 1830  vancomycin (VANCOCIN) IVPB 750 mg/150 ml premix     750 mg 150 mL/hr over 60 Minutes Intravenous  Once 07/17/14 1818 07/18/14 0024   07/15/14 1400  metroNIDAZOLE (FLAGYL) tablet 500 mg  Status:  Discontinued     500 mg Oral 3 times per day 07/15/14 1202 07/18/14 1533   07/13/14 2200  piperacillin-tazobactam (ZOSYN) IVPB 2.25 g  Status:  Discontinued     2.25 g 100 mL/hr over 30 Minutes Intravenous 3 times per day 07/13/14 1318 07/24/14 1138   07/13/14 2000  fluconazole (DIFLUCAN) IVPB 200 mg  Status:  Discontinued     200 mg 100 mL/hr over 60 Minutes Intravenous Every 24 hours 07/13/14 1318 07/19/14 1027   07/07/14 1200  vancomycin (VANCOCIN) IVPB 1000 mg/200 mL premix  Status:  Discontinued     1,000 mg 200 mL/hr over 60 Minutes Intravenous Every 24 hours 07/07/14 0937 07/14/14 1712   07/06/14 2000  fluconazole (DIFLUCAN) IVPB 400 mg  Status:  Discontinued     400 mg 100 mL/hr over 120 Minutes Intravenous Every 24 hours 07/06/14 1507 07/13/14 1318   07/06/14 1800  piperacillin-tazobactam (ZOSYN) IVPB 2.25 g  Status:  Discontinued  2.25 g 100 mL/hr over 30 Minutes Intravenous 4 times per day 07/06/14 1505 07/13/14 1318   07/05/14 2000  fluconazole (DIFLUCAN) IVPB 200 mg  Status:  Discontinued     200 mg 100 mL/hr over 60 Minutes Intravenous Every 24 hours 07/05/14 0757 07/06/14 1507   07/05/14 1400  piperacillin-tazobactam (ZOSYN) IVPB 2.25 g  Status:  Discontinued     2.25 g 100 mL/hr over 30 Minutes Intravenous 3 times per day 07/05/14 0749 07/06/14 1505   07/02/14 1800  vancomycin (VANCOCIN) IVPB 1000 mg/200 mL premix  Status:  Discontinued     1,000 mg 200 mL/hr over 60 Minutes Intravenous Every 24 hours 07/01/14 1858 07/05/14 0801   07/01/14 2200  piperacillin-tazobactam (ZOSYN) IVPB 3.375 g  Status:  Discontinued     3.375 g 100 mL/hr over 30 Minutes Intravenous 4 times per day 07/01/14 1858 07/05/14 0749   07/01/14 2000   fluconazole (DIFLUCAN) IVPB 400 mg  Status:  Discontinued     400 mg 100 mL/hr over 120 Minutes Intravenous Every 24 hours 07/01/14 1858 07/05/14 0757   07/01/14 1400  fluconazole (DIFLUCAN) IVPB 200 mg  Status:  Discontinued     200 mg 100 mL/hr over 60 Minutes Intravenous Every 24 hours 07/01/14 1330 07/01/14 1853   07/01/14 1000  piperacillin-tazobactam (ZOSYN) IVPB 2.25 g  Status:  Discontinued     2.25 g 100 mL/hr over 30 Minutes Intravenous Every 8 hours 07/01/14 0952 07/01/14 1853   07/01/14 1000  vancomycin (VANCOCIN) 500 mg in sodium chloride 0.9 % 100 mL IVPB     500 mg 100 mL/hr over 60 Minutes Intravenous  Once 07/01/14 0952 07/01/14 1126   06/30/14 1200  vancomycin (VANCOCIN) IVPB 1000 mg/200 mL premix     1,000 mg 200 mL/hr over 60 Minutes Intravenous  Once 06/30/14 0944 06/30/14 1403   06/30/14 1200  ceFEPIme (MAXIPIME) 2 g in dextrose 5 % 50 mL IVPB     2 g 100 mL/hr over 30 Minutes Intravenous  Once 06/30/14 0944 06/30/14 1425   06/30/14 0100  vancomycin (VANCOCIN) 2,000 mg in sodium chloride 0.9 % 500 mL IVPB     2,000 mg 250 mL/hr over 120 Minutes Intravenous  Once 06/30/14 0048 06/30/14 0525   06/30/14 0100  ceFEPIme (MAXIPIME) 2 g in dextrose 5 % 50 mL IVPB     2 g 100 mL/hr over 30 Minutes Intravenous  Once 06/30/14 0048 06/30/14 0322   06/27/14 0600  cefUROXime (ZINACEF) 1.5 g in dextrose 5 % 50 mL IVPB     1.5 g 100 mL/hr over 30 Minutes Intravenous On call to O.R. 06/26/14 1019 06/27/14 0630   06/19/14 1800  oseltamivir (TAMIFLU) capsule 30 mg     30 mg Oral Every M-W-F (1800) 06/19/14 1003 06/22/14 1841   06/18/14 1600  oseltamivir (TAMIFLU) capsule 30 mg  Status:  Discontinued     30 mg Oral Daily 06/18/14 1538 06/19/14 1003      Medications:  Scheduled: . antiseptic oral rinse  7 mL Mouth Rinse q12n4p  . budesonide (PULMICORT) nebulizer solution  0.25 mg Nebulization BID  . cefTAZidime (FORTAZ)  IV  2 g Intravenous Q T,Th,Sa-HD  . chlorhexidine  15  mL Mouth Rinse BID  . ferric gluconate (FERRLECIT/NULECIT) IV  125 mg Intravenous Q T,Th,Sa-HD  . insulin aspart  0-9 Units Subcutaneous 4 times per day  . levothyroxine  12.5 mcg Intravenous Daily  . metoprolol  5 mg Intravenous Q4H  . metronidazole  500 mg Intravenous 3 times per day  . potassium chloride  10 mEq Intravenous Q1 Hr x 4  . sodium chloride  10-40 mL Intracatheter Q12H    Objective: Vital signs in last 24 hours: Temp:  [97.2 F (36.2 C)-99.2 F (37.3 C)] 97.2 F (36.2 C) (05/18 0829) Pulse Rate:  [103-142] 105 (05/18 0910) Resp:  [14-25] 25 (05/18 0910) BP: (90-144)/(58-90) 119/89 mmHg (05/18 0910) SpO2:  [83 %-100 %] 100 % (05/18 0910) Weight:  [99 kg (218 lb 4.1 oz)] 99 kg (218 lb 4.1 oz) (05/18 0500)   General appearance: alert, cooperative and no distress Resp: clear to auscultation bilaterally Cardio: regular rate and rhythm GI: normal findings: bowel sounds normal and soft, non-tender and wound dressed.   Lab Results  Recent Labs  08/18/14 0449 08/19/14 0551 08/19/14 0730  WBC 8.4  --  11.7*  HGB 12.2  --  8.0*  HCT 38.1  --  26.1*  NA 129* 123* 132*  K 4.5 3.3* 3.4*  CL 93* 90* 96*  CO2 BUN 98* 45* 50*  CREATININE 5.84* 3.53* 3.81*   Liver Panel  Recent Labs  08/19/14 0551 08/19/14 0730  PROT 5.8* 6.2*  ALBUMIN 1.4* 1.6*  AST 28 31  ALT 11* 12*  ALKPHOS 140* 156*  BILITOT 1.0 1.1   Sedimentation Rate No results for input(s): ESRSEDRATE in the last 72 hours. C-Reactive Protein No results for input(s): CRP in the last 72 hours.  Microbiology: Recent Results (from the past 240 hour(s))  Wound culture     Status: None   Collection Time: 08/15/14  6:27 AM  Result Value Ref Range Status   Specimen Description WOUND  Final   Special Requests ABDOMIN  Final   Gram Stain   Final    MODERATE WBC PRESENT, PREDOMINANTLY PMN NO SQUAMOUS EPITHELIAL CELLS SEEN FEW GRAM NEGATIVE RODS Performed at [facility]     Culture   Final    ABUNDANT PSEUDOMONAS AERUGINOSA Performed at [facility]    Report Status 08/18/2014 FINAL  Final   Organism ID, Bacteria PSEUDOMONAS AERUGINOSA  Final      Susceptibility   Pseudomonas aeruginosa - MIC*    CEFEPIME 16 INTERMEDIATE Intermediate     CEFTAZIDIME 4 SENSITIVE Sensitive     CIPROFLOXACIN <=0.25 SENSITIVE Sensitive     GENTAMICIN 2 SENSITIVE Sensitive     IMIPENEM 1 SENSITIVE Sensitive     PIP/TAZO 16 SENSITIVE Sensitive     TOBRAMYCIN <=1 SENSITIVE Sensitive     * ABUNDANT PSEUDOMONAS AERUGINOSA  Culture, routine-abscess     Status: None (Preliminary result)   Collection Time: 08/17/14  2:51 PM  Result Value Ref Range Status   Specimen Description ABSCESS PELVIS  Final   Special Requests NONE  Final   Gram Stain   Final    FEW WBC PRESENT,BOTH PMN AND MONONUCLEAR NO SQUAMOUS EPITHELIAL CELLS SEEN NO ORGANISMS SEEN Performed at [facility]    Culture   Final    NO GROWTH 1 DAY Performed at [facility]    Report Status PENDING  Incomplete    Studies/Results: Dg Chest Port 1 View  08/18/2014   CLINICAL DATA:  Shortness of breath at rest.  Having hemodialysis.  EXAM: PORTABLE CHEST - 1 VIEW  COMPARISON:  08/15/2014.  FINDINGS: The cardiac silhouette remains mildly enlarged. Stable right jugular double-lumen catheter. Clear lungs. The pulmonary vasculature remains prominent. No pleural fluid. Minimal right shoulder degenerative  changes.  IMPRESSION: Stable cardiomegaly and pulmonary vascular congestion.   Electronically Signed   By: Beckie SaltsSteven  Reid M.D.   On: 08/18/2014 09:47   Ct Image Guided Fluid Drain By Catheter  08/17/2014   CLINICAL DATA:  Postoperative intra-abdominal abscess, subsequent encounter. Small pelvic fluid collection.  EXAM: CT-GUIDED DRAIN PLACEMENT IN PELVIC FLUID COLLECTION  Physician: Rachelle HoraAdam R. Lowella DandyHenn, MD  FLUOROSCOPY TIME:  None  MEDICATIONS: 50 mcg fentanyl  ANESTHESIA/SEDATION: Patient was monitored by a  radiology nurse during the procedure.  PROCEDURE: Informed consent was obtained for drain placement. Patient was placed on her left side. Images through the pelvis were obtained. The right buttock was prepped and draped in sterile fashion. 1% lidocaine was used for a local anesthetic. An 18 gauge needle was directed into the pelvic fluid collection with CT guidance from a transgluteal approach. Thick yellow purulent fluid was aspirated. A stiff Amplatz wire was placed. The tract was dilated to accommodate a 10.2 [place] multipurpose drain. It was technically difficult to advance the drain into the collection due to the small size of the collection and the patient's body habitus. Eventually, the drain was successfully placed within the collection. 5 mL of yellow purulent fluid was obtained. Catheter was sutured to the skin and attached to a suction bulb. Fluid was sent for culture.  FINDINGS: Small fluid collection just posterior to the uterus. A total of 5 mL of yellow purulent fluid was removed.  Estimated blood loss: Minimal  COMPLICATIONS: None  IMPRESSION: CT-guided placement of a drainage catheter in the small pelvic fluid collection.   Electronically Signed   By: Richarda OverlieAdam  Henn M.D.   On: 08/17/2014 17:21     Assessment/Plan: Wound infection Abscess Perforated pyloric ulcer Ischemic necrosis ileum COPD CKD, ESRD on HD HTN Protein calorie malnutrition, severe C diff (4-12)  +HIT  Total days of antibiotics: day 2 ceftaz/flagyl  No change in anbx for now Await IR aspirate Wound care         Johny SaxJeffrey Hatcher Infectious Diseases (pager) [phone] [email or web address] 08/19/2014, 11:01 AM  LOS: 63 days

## 2014-08-19 NOTE — Progress Notes (Signed)
Patient yelling for help. Nursing staff went into room found patient on knees on side of bed. When ask what happened patient stated "I rolled over and slide out of bed." Patient assisted back to bed. No injury noted. Denies pain at this time. BP 116/72 HR 103. Dr. Rito EhrlichKrishnan paged.

## 2014-08-19 NOTE — Progress Notes (Addendum)
Physical Therapy Treatment Patient Details Name: [name] MRN: [id number] DOB: 12-25-1955 Today's Date: 08/19/2014    History of Present Illness [name] is a 59 y.o. female with a history of CKD and hypertension came to the [facility] ED on 3/16 complaining of shortness of breath and generalized weakness x 2 weeks. Admitted with acute hypoxemic respiratory failure. During hospital stay noted to have weakness of her left side. 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  She developed septic shock 4/1 with pressors weaned off 4/3.  Began CRRT and pressors restarted 4/4; Developed pelvic abcess 4/7 with pelvic drain placed 4/8.  weaned off pressors 4/9.  Runs of SVT 4/5 and 4/11    PT Comments    Pt admitted with above diagnosis. Pt currently with functional limitations due to balance and endurance deficits.  Pt very self limiting at times.  Pt has made progress however slow progress.  Met 6/8 goals set originally.  Goals revised.  Feel that NHP with therapy is more appropriate for pt.   Continue acute PT.  Pt will benefit from skilled PT to increase their independence and safety with mobility to allow discharge to the venue listed below.    Follow Up Recommendations  SNF;Supervision/Assistance - 24 hour     Equipment Recommendations  Other (comment) (TBA)    Recommendations for Other Services       Precautions / Restrictions Precautions Precautions: Fall Precaution Comments: JP drains Restrictions Weight Bearing Restrictions: No    Mobility  Bed Mobility Overal bed mobility: Needs Assistance Bed Mobility: Supine to Sit Rolling: Min assist         General bed mobility comments: Pt not giving as much effort today.    Transfers Overall transfer level: Needs assistance Equipment used: Rolling walker (2 wheeled) Transfers: Sit to/from [employer] Sit to Stand: Mod assist;+2 physical assistance;From elevated surface Stand pivot transfers: Mod assist;+2 physical assistance;+2 safety/equipment       General transfer comment: Pt needed assist to power up from elevated surface.  Once up, pt was able to pivot to her left to chair.  Needed cues and asssist to move left LE with pt moving right LE more frequently and with less effort.  Needed cues to square self with chair.  Needed assist to control descent.   Ambulation/Gait                 Stairs            Wheelchair Mobility    Modified Rankin (Stroke Patients Only) Modified Rankin (Stroke Patients Only) Pre-Morbid Rankin Score: Slight disability Modified Rankin: Severe disability     Balance Overall balance assessment: Needs assistance;History of Falls Sitting-balance support: No upper extremity supported;Feet supported Sitting balance-Leahy Scale: Good Sitting balance - Comments: more assist to shift L hip to EOB vs R, used bed pad   Standing balance support: Bilateral upper extremity supported;During functional activity Standing balance-Leahy Scale: Poor Standing balance comment: Requires bil UE support.  Pt can stand statically with RW with min assist for balance.  Cannot perform dynamic activity without Mod assst.                     Cognition Arousal/Alertness: Awake/alert Behavior During Therapy: Flat affect Overall Cognitive Status: Impaired/Different from baseline Area of Impairment: Attention   Current Attention Level:  Selective           General Comments: Pt with minimal verbalizations.    Exercises General Exercises - Upper Extremity Shoulder Flexion: AAROM;Left;10 reps;Supine (therapist facilitating scapula) Shoulder ABduction: AAROM;Left;10 reps;Supine (therapist facilitating scapula) Shoulder ADduction: AAROM;Left;10 reps;Supine Shoulder Horizontal ABduction: AAROM;Left;10 reps;Supine (with therapist facilitating  scapula) Shoulder Horizontal ADduction: AAROM;Left;10 reps;Supine Elbow Flexion: AROM;10 reps;Supine;Left Elbow Extension: AAROM;Left;10 reps;Supine General Exercises - Lower Extremity Ankle Circles/Pumps: AROM;Both;10 reps;Supine Quad Sets: AROM;Both;10 reps;Supine Long Arc Quad: AROM;Both;10 reps;Seated Other Exercises Other Exercises: scapular elevation x5, depressionx5, retractionx5    General Comments        Pertinent Vitals/Pain Pain Assessment: Faces Faces Pain Scale: Hurts even more Pain Location: abdomen Pain Descriptors / Indicators: Sore Pain Intervention(s): Limited activity within patient's tolerance;Monitored during session;Premedicated before session;Repositioned  VSS    Home Living                      Prior Function            PT Goals (current goals can now be found in the care plan section) Progress towards PT goals: Progressing toward goals    Frequency  Min 2X/week    PT Plan Discharge plan needs to be updated;Frequency needs to be updated    Co-evaluation             End of Session Equipment Utilized During Treatment: Gait belt Activity Tolerance: Patient limited by fatigue Patient left: with call bell/phone within reach;in chair     Time: 4643-1427 PT Time Calculation (min) (ACUTE ONLY): 28 min  Charges:  $Therapeutic Exercise: 8-22 mins $Therapeutic Activity: 8-22 mins                    G CodesIrwin Brakeman F 09-13-2014, 3:12 PM [place] Tysheem Accardo,PT Acute Rehabilitation [phone] (pager)

## 2014-08-19 NOTE — Progress Notes (Signed)
PT refuses CPAP tonight. RT will monitor.

## 2014-08-19 NOTE — Progress Notes (Signed)
Occupational Therapy Treatment Patient Details Name: [name] MRN: [id number] DOB: February 05, 1956 Today's Date: 08/19/2014    History of present illness [name] is a 59 y.o. female with a history of CKD and hypertension [facility] complaining of shortness of breath and generalized weakness x 2 weeks. Admitted with acute hypoxemic respiratory failure. During hospital stay noted to have weakness of her left side. 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  She developed septic shock 4/1 with pressors weaned off 4/3.  Began CRRT and pressors restarted 4/4; Developed pelvic abcess 4/7 with pelvic drain placed 4/8.  weaned off pressors 4/9.  Runs of SVT 4/5 and 4/11   OT comments  Pt with generalized discomfort and grimacing.  Willing to work with therapist on L UE strengthening at bed level and simple grooming.   Follow Up Recommendations  SNF;Supervision/Assistance - 24 hour    Equipment Recommendations       Recommendations for Other Services      Precautions / Restrictions Precautions Precautions: Fall Precaution Comments: JP drain Restrictions Weight Bearing Restrictions: No       Mobility Bed Mobility   Bed Mobility: Rolling Rolling: Supervision (toward L)            Transfers                      Balance                                   ADL Overall ADL's : Needs assistance/impaired     Grooming: Wash/dry hands;Wash/dry face;Bed level (applied lotion to hands)                                        Vision                     Perception     Praxis      Cognition   Behavior During Therapy: Flat affect Overall Cognitive Status: Impaired/Different from baseline Area of Impairment: Attention   Current Attention Level: Selective            General  Comments: Pt with minimal verbalizations.    Extremity/Trunk Assessment               Exercises General Exercises - Upper Extremity Shoulder Flexion: AAROM;Left;10 reps;Supine (therapist facilitating scapula) Shoulder ABduction: AAROM;Left;10 reps;Supine (therapist facilitating scapula) Shoulder ADduction: AAROM;Left;10 reps;Supine Shoulder Horizontal ABduction: AAROM;Left;10 reps;Supine (with therapist facilitating scapula) Shoulder Horizontal ADduction: AAROM;Left;10 reps;Supine Elbow Flexion: AROM;10 reps;Supine;Left Elbow Extension: AAROM;Left;10 reps;Supine Other Exercises Other Exercises: scapular elevation x5, depressionx5, retractionx5   Shoulder Instructions       General Comments      Pertinent Vitals/ Pain       Pain Assessment: Faces Faces Pain Scale: Hurts even more Pain Location: all over, particularly abdomen Pain Descriptors / Indicators: Sore Pain Intervention(s): Monitored during session;Repositioned  Home Living  Prior Functioning/Environment              Frequency Min 2X/week     Progress Toward Goals  OT Goals(current goals can now be found in the care plan section)  Progress towards OT goals: Progressing toward goals     Plan Discharge plan remains appropriate    Co-evaluation                 End of Session Equipment Utilized During Treatment: Oxygen (1L)   Activity Tolerance Patient limited by lethargy;Patient limited by pain   Patient Left in bed;with call bell/phone within reach   Nurse Communication          Time: 1345-1402 OT Time Calculation (min): 17 min  Charges: OT General Charges $OT Visit: 1 Procedure OT Treatments $Neuromuscular Re-education: 8-22 mins  [name], [name] 08/19/2014, 2:12 PM  [phone]

## 2014-08-19 NOTE — Progress Notes (Signed)
[facility] Progress Note   Subjective: no complaints   Filed Vitals:   08/19/14 0829 08/19/14 0910 08/19/14 1135 08/19/14 1243  BP: 114/74 119/89 126/77 116/72  Pulse: 105 105 104 101  Temp: 97.2 F (36.2 C)  97.6 F (36.4 C) 97.8 F (36.6 C)  TempSrc: Oral  Oral Oral  Resp: 22 25 21 19   Height:      Weight:      SpO2: 97% 100% 99% 100%   Exam: Alert, no distress No jvd Chest clear bilat RRR no MRG Abd large dressing, PEG tube in place, drain RUQ No LE or UE edema Neuro is alert ox 3 HD cath  HD: new start, TTS in hospital. 103 kg on admission, lowest wt ~ 87kg        Assessment: 1. New ESRD TTS for now 2. Pelvic fluid s/p drain 5/16 3. CVA acute b y MRI 3/19 4. Perf pyloric ulcer SP exlap, w duod fistula 5. Nutrition w G-J tube starting TF's +TNA 6. Volume wt's creeping up, CXR clear 5/17 7. C diff 8. Anemia on darbe 9. HD access needs revision of AVF when healthier 10. HypoPO4, resolved 11. Parox SVT on IV MTP q 4hrs 12. 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  Plan - HD Thursday, UF 2kg as tolerated    Vinson Moselleob Rambo Sarafian MD  pager [phone]    cell [phone]  08/19/2014, 1:27 PM     Recent Labs Lab 08/15/14 0430 08/16/14 0641 08/17/14 0430 08/18/14 0449 08/19/14 0551 08/19/14 0730  NA 131* 132* 131* 129* 123* 132*  K 3.0* 4.4 4.2 4.5 3.3* 3.4*  CL 98* 95* 95* 93* 90* 96*  CO2 23 28 26 24 26 28   GLUCOSE 128* 127* 68 125* 734* 119*  BUN 66* 45* 71* 98* 45* 50*  CREATININE 4.23* 3.59* 4.80* 5.84* 3.53* 3.81*  CALCIUM 7.4* 7.6* 7.9* 7.7* 7.3* 7.5*  PHOS 3.8 4.0 6.2*  --   --   --     Recent Labs Lab 08/18/14 0449 08/19/14 0551 08/19/14 0730  AST 24 28 31   ALT 8* 11* 12*  ALKPHOS 163* 140* 156*  BILITOT 1.2 1.0 1.1  PROT 6.2* 5.8* 6.2*  ALBUMIN 1.5* 1.4* 1.6*    Recent Labs Lab 08/17/14 0430 08/18/14 0449 08/19/14 0730  WBC 9.8 8.4 11.7*  NEUTROABS 7.3  --  8.5*  HGB 11.3* 12.2 8.0*  HCT 36.3 38.1 26.1*  MCV 90.5 88.4  90.3  PLT 203 185 240   . antiseptic oral rinse  7 mL Mouth Rinse q12n4p  . ascorbic acid  1,000 mg Intravenous Daily  . budesonide (PULMICORT) nebulizer solution  0.25 mg Nebulization BID  . cefTAZidime (FORTAZ)  IV  2 g Intravenous Q T,Th,Sa-HD  . chlorhexidine  15 mL Mouth Rinse BID  . ferric gluconate (FERRLECIT/NULECIT) IV  125 mg Intravenous Q T,Th,Sa-HD  . insulin aspart  0-9 Units Subcutaneous 4 times per day  . levothyroxine  12.5 mcg Intravenous Daily  . metoprolol  5 mg Intravenous Q4H  . metronidazole  500 mg Intravenous 3 times per day  . potassium chloride  10 mEq Intravenous Q1 Hr x 4  . sodium chloride  10-40 mL Intracatheter Q12H   . sodium chloride 10 mL/hr at 08/19/14 0950  . TPN (CLINIMIX) Adult without lytes 100 mL/hr at 08/18/14 1721   And  . fat emulsion 240 mL (08/18/14 1721)  . Marland Kitchen.TPN (CLINIMIX-E) Adult     And  . fat emulsion    .  feeding supplement (VITAL AF 1.2 CAL) 1,000 mL (08/19/14 1035)  . octreotide  (SANDOSTATIN)    IV infusion 50 mcg/hr (08/19/14 0529)   bisacodyl, camphor-menthol, diphenhydrAMINE, diphenhydrAMINE-zinc acetate, HYDROmorphone (DILAUDID) injection, levalbuterol, ondansetron (ZOFRAN) IV, sodium chloride

## 2014-08-19 NOTE — Progress Notes (Signed)
[name]:[id number] DOB: 25-Mar-1956 DOA: 06/17/2014 PCP: [name], MD   Brief Narrative: 59 yo ? smoker with hx HTN, COPD, CKD IV followed @ [facility]-[name] Page, failed L AV fistula, initially admitted 3/16 with flu and acute hypoxic resp failure. Ultimately tx to [facility] due to need for HD. L sided weakness 3/18 and found to have R ACA stroke-showed acute infarct R ACA. Patient more lethargic 3/30 and CT abd=perf viscus 3/31. She was taken to the OR for ex lap for perforated pyloric ulcer with diffuse peritonitis, ischemic necrosis of mid ileum. Patient developed septic shock on 4-1, she was started on pressors and wean off pressors on 4-3. Subsequently she was diagnosed with pelvic abscess by CT scan perform on 4-7. She underwent pelvic drain placement by IR. C. diff diagnosed 4-12-initially unresponsive to flagyl, and she was started on vancomycin 4-16. Develops  run of SVT. Cardiology was helping with management. Course complicated with high RV pressure, CT angio negative for PE.  Also with high output fistula formation between anastomosis and drain, making her back to NPO state and in need of TPN. Palliative care consulted. On 5/2 she developed hypoxemia, hypotension and SVT during dialysis treatment. Dialysis was stopped. She received IV bolus. She was started on BIPAP. She was transferred to Step down unit. PCCM and cardiology re consulted. Her Hr decreased, BP improved. She was taken off BIPAP. This recurred on 5/15 with episodic SVT.  Now stable, on TNA + very high output from fistula/leak per [facility]. Had a G_J tube placed by Dr. [name] of IR and Gen surgery has continued to assist with management of her multiple surgical comorbidities. CT repeat 5/14 showed 5 cm abscess-transgluteal drain with 5 ml of purulent fluid removed on 5/17.  CIR is interested in taking her if she can stabilize enough from her medical conditions  Subjective: Patient feels tired this morning. Has pain  around her drain sites. Denies any shortness of breath. No chest pain. Anxious.   Assessment/Plan: Acute hypoxic Respiratory Failure:  -This was secondary to H1N1, hypervolemia, HCAP >> resolved.  -also has COPD and PAH -improving, now weaning off O2 -Nebulizer PRN and CPAP QHS   Septic shock  -due to perforated prepyloric ulcer, required pressors -resolved currently  C. difficile colitis still on contact precautions -C diff positive on 4-12, Received 4 days of flagyl without improvement, then started on PO vanc -Completed 3 weeks of Oral Vanc-stopped 5/7, this was 1 week after IV Zosyn was stopped 5/5 -Diarrhea resolved -keep contact precautions on board   Status post exploratory laparotomy with closure of perforated pyloric ulcer/abdominal abscess / small bowel resection w/ postop ileus and now with fistula. Pelvic abscess s/p percutaneous drain placement 4/8; had leakage from anastomosis site. Received Zosyn for 23 days. Chronic low grade leukocytosis range 13-15 -IV antibiotics were discontinued by Surgery on 4/22. Also received 18 days of Vancomycin and fluconazole.  -Due to Fistula is back to NPO state, TPN. Trickle feeds being attempted. -Fistula study done shows JP drain connection to the duodenal bulb-5-02. - repeat CT abdomen with  3.0 x 3.5 x 5.7 cm collection in the pelvic cul-de-sac and extraluminal air alongside JP drain in anterior abd wall -Drain with old and new blood, Hb trending down, transfuse 2 unit PRBC 5/10 -continues to have high output from Fistula/leak. Had G_J placed by IR 08/12/14 at request of gen surgery -Patient continues to be on octreotide, which is being managed by surgery.  -CT repeat  shows 5 cm post cul-de-sac abscess which was drained on 5/16-culture pending  -ID was consulted as patient had Pseudomonas growing from her culture wounds and they started back Flagyl as well as Elita QuickFortaz on 5/16 and are following and we await final growth from her new abscess  drained on 5/16   PSVT/Multiple episoded 5/2 and 5/15 and 5/17  -Felt to be secondary to severe anxiety with dialysis   -Has been evaluated by EP.  -Cardiology following peripherally- follow K+ and Mg  -Continue IV metoprolol, now off amiodarone;  -V-Q scan with intermediate probability for pulmonary embolism.  -CTA 4/27, negative for PE   -she is hypotensive 2/2 to metoprolol IV but has been stable. Has occasional burst of heart rate into 130s but then comes back to 90-100 range.  ACA CVA with Lt sided weakness -TEE ;normal LV function; no LAA thrombus -Started on ASA 81mg  per Neuro -Will need ongoing long term rehab - PT/OT to cont to follow while inpatient -Patient remains very weak and deconditioned. Will potentially need CIR when medically stable  ESRD new start HD this admit complicated by intermittent hypotension AKi on CKD 4, due to sepsis, shock Ongoing hemodialysis per Nephrology - perm-cath placed 3/26 d/t AVF problems Patient AV fistula reviewed and not mature yet per Dr. Paulene FloorFields-will revisit prior to d/c has had some ongoing hypotension-EDW may need to be adjusted as per Nephrology The hyponatremia and hyperglycemia noted on this mornings lab work was erroneous. Repeat blood work shows levels that are her baseline.  Chronic diastolic congestive heart failure w/ Severe RHF EF 60-65%, grade 1 diastolic dysfunction. Fluids/volume managed with hemodialysis.  Elevated PA pressures on 2-D echo; CTA negative for PE  Small PFO Has been evaluated by Cardiology  Anemia of critical illness/chronic disease -and bleeding from leak/fistula -s/p 2U PRBC 4/13 and 5/10 Aranesp/iron per renal -hb stable 8-9 range  Thrombocytopenia; resolved.  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 On TPN now, CBGs  stabilizedin 120-160s Hba1c 6.5  Hypokalemia Being addressed via TPN  Abnormal TSH -Free T4 WNL, T3 1.8 -most likely sick thyroid with ongoing infection and body stress; also due to amiodarone use. TSH in 7 range -continue low dose synthroid given PSVT, now on IV synthroid   L Renal mass  -noted on US, Indeterminate 1.9 cm hypoechoic mass off the interpolar region of the left kidney -may potentially represent a cyst however solid mass is not excluded -needs  Outpatient FU of this  Obesity - Inadequate oral intake related to altered GI function as evidenced by limited intake, ongoing.  Body mass index is 35.24 kg/(m^2). -patient weight trending down -patient unable to tolerate diet and with leaking fistula  DVT prophylaxis: SCDs Code Status: FULL Family Communication:  discussed with family at the bedside  Disposition Plan: Not ready for discharge. CIR is following.  Consultants: Cardiology Nephrology PCCM Gen Surgery  Palliative care  Significant Events: 3/16 influenza A+ 3/18 R ACA stroke 3/19 MRI multifocal acute infarction of both hemispheres, large right distal ACA 3/22 TEE severe RAE, severely reduced RV function, severe TR, no LAA thrombus, small PFO on TEE 3/31 to  OR ex lap for per pyloric ulcer and sm bowel resection 4/1 septic shock on pressors 4/3 pressors weaned off  4/4 restart pressors, CRRT 4/6 levo switched to neo 4/7 pelvic abscess on CT 4/8 IR placed pelvic drain 4/9 off pressors 4/11 long run of SVT. 4/23 VQ scan which demonstrated intermediate probability for PE 4/24 CT angiogram of her chest: Pending/unable to be done due to lack of access    Objective: Blood pressure 114/64, pulse 103, temperature 98.4 F (36.9 C), temperature source Oral, resp. rate  22, height  (1.676 m), weight 99 kg (218 lb 4.1 oz), SpO2 99 %.  Intake/Output Summary (Last 24 hours) at 08/19/14 0745 Last data filed at 08/19/14 0700  Gross per 24 hour  Intake    785 ml  Output    950 ml  Net   -165 ml   Exam: General: AAOx3, tired coherent Lungs: Decreased air entry at the bases. No definite crackles. Cardiovascular: Regular rate, no murmurs and no gallops, sinus tachycardia  Abdomen: Abdominal wound dressing noted-wounds not examined. Multiple drains also noted. G-tube also present.   Data Reviewed: Basic Metabolic Panel:  Recent Labs Lab 08/14/14 0615 08/14/14 1029 08/15/14 0430 08/16/14 0641 08/17/14 0430 08/18/14 0449 08/19/14 0551  NA 128* 134* 131* 132* 131* 129* 123*  K 4.8 3.7 3.0* 4.4 4.2 4.5 3.3*  CL 93* 98* 98* 95* 95* 93* 90*  CO2 GLUCOSE 649* 125* 128* 127* 68 125* 734*  BUN 43* 50* 66* 45* 71* 98* 45*  CREATININE 3.33* 3.59* 4.23* 3.59* 4.80* 5.84* 3.53*  CALCIUM 8.0* 8.2* 7.4* 7.6* 7.9* 7.7* 7.3*  MG 2.7* 2.5* 2.3 2.2 2.3  --   --   PHOS 6.5* 4.8* 3.8 4.0 6.2*  --   --     Liver Function Tests:  Recent Labs Lab 08/13/14 0322 08/14/14 0615 08/17/14 0430 08/18/14 0449 08/19/14 0551  AST ALT 14 10* 9* 8* 11*  ALKPHOS 170* 157* 162* 163* 140*  BILITOT 0.8 1.2 1.1 1.2 1.0  PROT 6.9 6.2* 6.3* 6.2* 5.8*  ALBUMIN 1.7* 1.5* 1.6* 1.5* 1.4*   CBC:  Recent Labs Lab 08/14/14 0615 08/15/14 1045 08/16/14 0641 08/17/14 0430 08/18/14 0449  WBC 15.4* 14.4* 16.0* 9.8 8.4  NEUTROABS  --   --   --  7.3  --   HGB 8.3* 9.2* 8.6* 11.3* 12.2  HCT 27.7* 29.6* 28.2* 36.3 38.1  MCV 91.1 88.1 90.4 90.5 88.4  PLT 223 276 250 203 185    CBG:  Recent Labs Lab 08/18/14 0617 08/18/14 1258 08/18/14 1817 08/19/14 0038 08/19/14 0603  GLUCAP 136* 137* 127* 134* 131*    Scheduled Meds:  Scheduled Meds: . antiseptic oral rinse  7 mL Mouth Rinse q12n4p  . budesonide (PULMICORT) nebulizer  solution  0.25 mg Nebulization BID  . cefTAZidime (FORTAZ)  IV  2 g Intravenous Q T,Th,Sa-HD  . chlorhexidine  15 mL Mouth Rinse BID  . ferric gluconate (FERRLECIT/NULECIT) IV  125 mg Intravenous Q T,Th,Sa-HD  . insulin aspart  0-9 Units Subcutaneous 4 times per day  . levothyroxine  12.5 mcg Intravenous Daily  . metoprolol  5 mg Intravenous Q4H  . metronidazole  500 mg Intravenous 3 times per day  . sodium chloride  10-40 mL Intracatheter Q12H    Time spent on care of this patient:   Jyren Cerasoli [facility] (P) [phone]   If 7PM-7AM,  please contact night-coverage [email or web address] Password [email or web address] 08/19/2014, 7:45 AM   LOS: 63 days

## 2014-08-19 NOTE — Progress Notes (Addendum)
[place] for TPN Indication:  High output Duodenal Fistula  No Known Allergies  Patient Measurements: Height: _0  (167.6 cm) Weight: 218 lb 4.1 oz (99 kg) IBW/kg (Calculated) : 59.3 Adjusted Body Weight: 71.2kg  Vital Signs: Temp: 97.2 F (36.2 C) (05/18 0829) Temp Source: Oral (05/18 0829) BP: 119/89 mmHg (05/18 0910) Pulse Rate: 105 (05/18 0910) Intake/Output from previous day: 05/17 0701 - 05/18 0700 In: 785 [I.V.:585; IV Piggyback:200] Out: 950 [Drains:1150]  Labs:  Recent Labs  08/17/14 0430 08/18/14 0449 08/19/14 0730  WBC 9.8 8.4 PENDING  HGB 11.3* 12.2 8.0*  HCT 36.3 38.1 26.1*  PLT 203 185 240    Recent Labs  08/17/14 0430 08/18/14 0449 08/19/14 0551 08/19/14 0730  NA 131* 129* 123* 132*  K 4.2 4.5 3.3* 3.4*  CL 95* 93* 90* 96*  CO2 _1 GLUCOSE 68 125* 734* 119*  BUN 71* 98* 45* 50*  CREATININE 4.80* 5.84* 3.53* 3.81*  CALCIUM 7.9* 7.7* 7.3* 7.5*  MG 2.3  --   --   --   PHOS 6.2*  --   --   --   PROT 6.3* 6.2* 5.8* 6.2*  ALBUMIN 1.6* 1.5* 1.4* 1.6*  AST _2 ALT 9* 8* 11* 12*  ALKPHOS 162* 163* 140* 156*  BILITOT 1.1 1.2 1.0 1.1  PREALBUMIN 7.9*  --   --   --   TRIG 142  --   --   --    Estimated Creatinine Clearance: 19.1 mL/min (by C-G formula based on Cr of 3.81).   Recent Labs  08/18/14 1817 08/19/14 0038 08/19/14 0603  GLUCAP 127* 134* 131*   Insulin Requirements in the past 24 hours:  4 unit Novolog SSI  Current Nutrition:  Clinimix 5/15 at 111m/hr and 20% IVFE at 179mhr (at goal)  Nutritional Goals: per RD assessment 5/12 2100-2300 kCal, 120-140 grams of protein per day Goal: Clinimix 5/15 at 1008mr + 20% IVFE 59m38m to provide 2184 kcal and 120 gm protein  Assessment: 58 Y83 with complicated hospital course. S/p closure perforated pyloric ulcer, SBR for ischemic necrosis, diffuse peritonitis on 07/01/14. Pharmacy consulted to provide TPN 4/1-4/15 for  nutrition support for prolonged ileus. Pt then transitioned to po diet + supplements. 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  GI: Duodenal fistula. NPO. Open abd wound, (+)abd pain. Octreotide drip ordered 5/10 am. Albumin 1.7. Prealbumin remains low at 7.9. Trickle feeds started 5/12, +flatus, on 5/14 AM TFs were noted to be leaking out of blake drain and G-tube - TF has been held since. Abdominal drain output/24h: 600 cc (slightly up). BM yesterday. G tube to gravity, may resume trickle feeds through J tube today    5/10 Add octreotide drip 5/11 IR placed gastro-jenjunal tube for post-pyloric feeding and drainage of gastric juices 5/12 to start Vital AF 1.2 trickle feedings via J tube at 10 ml/hr 5/13 Continues on trickle feeds at 10 ml/hr 5/14: Possible leak in GJ tube - TFs held. Imaging showed no change in JP position with tips in proper positions however pt still with pre-rectal fluid collection 5/15: TFs still being held. IR to replace drain for fluid collection 5/16: IR percutaneous placement of drain in pelvis with aspiration/drainage   Endo: Hx DM/hypothyroidism. Had low CBGs prior to TPN start, current CBGs/24h: 68-160, had one outlier last night, cbg 734, possible lab error. No insulin in TPN,  on sensitive SSI, synthroid (new) TSH 7.3  Lytes: Lytes being added and removed to the TPN bag as needed based on labs. Na 131 > 129 > 123, K 3.3, Phos 6.2, Mg 2.3, CoCa 9.2.  Per discussion with renal-pharmacy to manage K - will replace today. Ca x Phos = 57  Renal: New ESRD - started 3/17. HD on TTS. Last HD on 5/17 4 hr BFR 300-350. NS kvo  Heme: Anemia of ESRD. Hgb 8.6 > 12.2 > 8, plts wnl, on Aranesp 200 mcg-Tues (dose held 5/17 due to hgb) On Nulecit/HD  Pulm: Hx asthma/COPD. RA. CT negative for PE  Cards: HTN. (afib).  IV metoprolol. CHADS-VASc =4 (not good candidate for [facility]). 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    Hepatotobil: Alk phos elevated, AST/ALT, Tbili wnl, TG 142 (5/16)  Neuro: Hx depression / insomnia - new stroke noted on 3/18.   ID: Afebrile, WBC 16 > 8.4, s/p treatment for CDiff and s/p multiple courses of abx during admission, fluconazole, IV vanc, zosyn.  Hep B Ag negative. Flagyl resumed empirically on 5/17 when ceftaz was resumed. 5/14 wound cx (abdomen): pseudomonas   5/17 ceftazidime >> 5/17 flagyl >>  Best Practices: SCDs, PPI IV  TPN Access: PICC line in IR 4/27  TPN start date: 4/1 >> 4/15; restart 4/27>>  Plan:  - Change to Clinimix E 5/15 at goal rate of 100 ml/hr  + IVFE at 10 ml/hr - MV + TE in TPN - Continue sensitive SSI - F/u tolerance to trickle feeds - Watch Ca x Phos product - Monitor Na trend - F/u duration of octreotide infusion - Pharmacy managing K replacemnt - will give 10 mEq x4 - Watch cbgs - TPN labs in am     Hughes Better, PharmD, BCPS Clinical Pharmacist Pager: [phone] 08/19/2014 9:14 AM    Addendum  On repeat assessment, it was noted on repeat CMP, Na is back up to 132, f/u in am  Harvel Quale 10:28 AM 08/19/2014    Addendum  MD ordered Ascorbic Acid 1000 mg IV daily x 14 days. This will be added to the TPN bag.  Harvel Quale 08/19/2014 2:02 PM

## 2014-08-19 NOTE — Progress Notes (Addendum)
Nutrition Follow-up  DOCUMENTATION CODES:  Obesity unspecified  INTERVENTION:  TPN, Tube feeding   Recommend increase Vital AF 1.2 formula by 10 ml every 12 hours to goal rate of 75 ml/hr to provide 2160 kcals, 135 gm protein, 1460 ml free water daily  NUTRITION DIAGNOSIS:  Inadequate oral intake related to inability to eat as evidenced by NPO status, ongoing  GOAL:  Patient will meet greater than or equal to 90% of their needs, met  MONITOR:  TF tolerance, Labs, Skin, I & O's, Weight trends (TPN adequacy)  ASSESSMENT: Pt with CKD and hypertension, presented on 3/16 with shortness of breath and generalized weakness x 2 weeks. Pt found to have metabolic acidosis from worsening uremia, H1N1 flu. Pt started on HD. 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  Patient s/p procedure 3/30: EXPLORATORY LAPAROTOMY CLOSURE OF PERFORATED PYLORIC ULCER SMALL BOWEL RESECTION  Pt was receiving TPN until 4/15, then transitioned to a PO diet + supplements. 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   Patient continues to receive TPN with Clinimix E 5/15 @ 100 ml/hr and lipids @ 10 ml/hr. Provides 2184 kcal, and 120 grams protein per day. Meets 100% minimum estimated energy needs and 100% minimum estimated protein needs.   Patient s/p procedure 5/11: G-J TUBE PLACEMENT -- tip past ligament of Treitz  Currently receiving Vital AF 1.2 at 10 ml/hr (288 kcals, 18 gm protein, 194 ml free water daily) via J-port.  Height:  Ht Readings from Last 1 Encounters:  08/02/14 $RemoveB'5\' 6"'DTyUXPqt$  (1.676 m)    Weight:  Wt Readings from Last 1 Encounters:  08/19/14 218 lb 4.1 oz (99 kg)   08/11/14 195 lb 1.7 oz (88.5 kg)       Ideal Body Weight:  59.1 kg  BMI:  Body mass index is 35.24 kg/(m^2).  Estimated Nutritional Needs:  Kcal:  2100-2300  Protein:  120-140 gm  Fluid:  1.2 L  Skin:  Wound (see comment) (stage 2 pressure ulcer  to sacrum)  Diet Order:  Diet NPO time specified TPN (CLINIMIX) Adult without lytes TPN (CLINIMIX-E) Adult  EDUCATION NEEDS:  Education needs no appropriate at this time   Intake/Output Summary (Last 24 hours) at 08/19/14 1514 Last data filed at 08/19/14 1432  Gross per 24 hour  Intake    785 ml  Output   2000 ml  Net  -1215 ml    Last BM:  5/14  [name], RD, LDN Pager #: [phone] After-Hours Pager #: [phone]

## 2014-08-20 DIAGNOSIS — S31609D Unspecified open wound of abdominal wall, unspecified quadrant with penetration into peritoneal cavity, subsequent encounter: Secondary | ICD-10-CM

## 2014-08-20 DIAGNOSIS — X58XXXD Exposure to other specified factors, subsequent encounter: Secondary | ICD-10-CM

## 2014-08-20 LAB — GLUCOSE, CAPILLARY
GLUCOSE-CAPILLARY: 123 mg/dL — AB (ref 65–99)
GLUCOSE-CAPILLARY: 162 mg/dL — AB (ref 65–99)
Glucose-Capillary: 125 mg/dL — ABNORMAL HIGH (ref 65–99)
Glucose-Capillary: 129 mg/dL — ABNORMAL HIGH (ref 65–99)

## 2014-08-20 LAB — COMPREHENSIVE METABOLIC PANEL
ALBUMIN: 1.5 g/dL — AB (ref 3.5–5.0)
ALK PHOS: 139 U/L — AB (ref 38–126)
ALT: 8 U/L — ABNORMAL LOW (ref 14–54)
ANION GAP: 8 (ref 5–15)
AST: 20 U/L (ref 15–41)
BUN: 67 mg/dL — AB (ref 6–20)
CO2: 26 mmol/L (ref 22–32)
CREATININE: 4.66 mg/dL — AB (ref 0.44–1.00)
Calcium: 7.6 mg/dL — ABNORMAL LOW (ref 8.9–10.3)
Chloride: 96 mmol/L — ABNORMAL LOW (ref 101–111)
GFR calc Af Amer: 11 mL/min — ABNORMAL LOW (ref >60)
GFR calc non Af Amer: 9 mL/min — ABNORMAL LOW (ref >60)
Glucose, Bld: 125 mg/dL — ABNORMAL HIGH (ref 65–99)
POTASSIUM: 3.7 mmol/L (ref 3.5–5.1)
Sodium: 130 mmol/L — ABNORMAL LOW (ref 135–145)
Total Bilirubin: 0.9 mg/dL (ref 0.3–1.2)
Total Protein: 6.2 g/dL — ABNORMAL LOW (ref 6.5–8.1)

## 2014-08-20 LAB — CBC
HCT: 25.6 % — ABNORMAL LOW (ref 36.0–46.0)
Hemoglobin: 7.9 g/dL — ABNORMAL LOW (ref 12.0–15.0)
MCH: 27.7 pg (ref 26.0–34.0)
MCHC: 30.9 g/dL (ref 30.0–36.0)
MCV: 89.8 fL (ref 78.0–100.0)
Platelets: 260 10*3/uL (ref 150–400)
RBC: 2.85 MIL/uL — AB (ref 3.87–5.11)
RDW: 19.5 % — ABNORMAL HIGH (ref 11.5–15.5)
WBC: 11 10*3/uL — ABNORMAL HIGH (ref 4.0–10.5)

## 2014-08-20 LAB — PHOSPHORUS: PHOSPHORUS: 4.4 mg/dL (ref 2.5–4.6)

## 2014-08-20 LAB — MAGNESIUM: Magnesium: 2 mg/dL (ref 1.7–2.4)

## 2014-08-20 MED ORDER — FAT EMULSION 20 % IV EMUL
240.0000 mL | INTRAVENOUS | Status: AC
  Administered 2014-08-20: 240 mL via INTRAVENOUS
  Filled 2014-08-20: qty 250

## 2014-08-20 MED ORDER — NEPRO/CARBSTEADY PO LIQD: 237.0000 mL | ORAL | Status: DC | PRN

## 2014-08-20 MED ORDER — VITAL AF 1.2 CAL PO LIQD
1000.0000 mL | ORAL | Status: DC
  Administered 2014-08-20 – 2014-08-22 (×2): 1000 mL
  Filled 2014-08-20 (×5): qty 1000

## 2014-08-20 MED ORDER — SODIUM CHLORIDE 0.9 % IV SOLN: 100.0000 mL | INTRAVENOUS | Status: DC | PRN

## 2014-08-20 MED ORDER — LIDOCAINE HCL (PF) 1 % IJ SOLN: 5.0000 mL | INTRAMUSCULAR | Status: DC | PRN

## 2014-08-20 MED ORDER — ALTEPLASE 2 MG IJ SOLR
2.0000 mg | Freq: Once | INTRAMUSCULAR | Status: AC | PRN
  Filled 2014-08-20: qty 2

## 2014-08-20 MED ORDER — TRACE MINERALS CR-CU-F-FE-I-MN-MO-SE-ZN IV SOLN
INTRAVENOUS | Status: AC
  Administered 2014-08-20: 17:00:00 via INTRAVENOUS
  Filled 2014-08-20: qty 1920

## 2014-08-20 MED ORDER — LIDOCAINE-PRILOCAINE 2.5-2.5 % EX CREA: 1.0000 "application " | TOPICAL_CREAM | CUTANEOUS | Status: DC | PRN

## 2014-08-20 MED ORDER — DARBEPOETIN ALFA 100 MCG/0.5ML IJ SOSY
PREFILLED_SYRINGE | INTRAMUSCULAR | Status: AC
  Filled 2014-08-20: qty 0.5

## 2014-08-20 MED ORDER — PENTAFLUOROPROP-TETRAFLUOROETH EX AERO: 1.0000 "application " | INHALATION_SPRAY | CUTANEOUS | Status: DC | PRN

## 2014-08-20 MED ORDER — DARBEPOETIN ALFA 100 MCG/0.5ML IJ SOSY
100.0000 ug | PREFILLED_SYRINGE | INTRAMUSCULAR | Status: DC
  Administered 2014-08-20: 100 ug via INTRAVENOUS

## 2014-08-20 MED ORDER — ALTEPLASE 2 MG IJ SOLR: 2.0000 mg | Freq: Once | INTRAMUSCULAR | Status: DC | PRN

## 2014-08-20 MED ORDER — HEPARIN SODIUM (PORCINE) 1000 UNIT/ML DIALYSIS: 1000.0000 [IU] | INTRAMUSCULAR | Status: DC | PRN

## 2014-08-20 MED ORDER — METOPROLOL TARTRATE 1 MG/ML IV SOLN
2.5000 mg | Freq: Four times a day (QID) | INTRAVENOUS | Status: DC
  Administered 2014-08-21 – 2014-08-31 (×17): 2.5 mg via INTRAVENOUS
  Filled 2014-08-20 (×49): qty 5

## 2014-08-20 NOTE — Progress Notes (Signed)
[place] for TPN Indication:  High output Duodenal Fistula  No Known Allergies  Patient Measurements: Height: _0  (167.6 cm) Weight: 220 lb 3.8 oz (99.9 kg) IBW/kg (Calculated) : 59.3 Adjusted Body Weight: 71.2kg  Vital Signs: Temp: 97.9 F (36.6 C) (05/19 0400) Temp Source: Oral (05/19 0400) BP: 114/66 mmHg (05/19 0400) Pulse Rate: 101 (05/19 0400) Intake/Output from previous day: 05/18 0701 - 05/19 0700 In: 984.2 [I.V.:660; NG/GT:124.2; IV Piggyback:200] Out: 1550 [Drains:1550]  Labs:  Recent Labs  08/18/14 0449 08/19/14 0730 08/20/14 0500  WBC 8.4 11.7* 11.0*  HGB 12.2 8.0* 7.9*  HCT 38.1 26.1* 25.6*  PLT 185 240 260    Recent Labs  08/19/14 0551 08/19/14 0730 08/20/14 0500  NA 123* 132* 130*  K 3.3* 3.4* 3.7  CL 90* 96* 96*  CO2 _1 GLUCOSE 734* 119* 125*  BUN 45* 50* 67*  CREATININE 3.53* 3.81* 4.66*  CALCIUM 7.3* 7.5* 7.6*  MG  --   --  2.0  PHOS  --   --  4.4  PROT 5.8* 6.2* 6.2*  ALBUMIN 1.4* 1.6* 1.5*  AST _2 ALT 11* 12* 8*  ALKPHOS 140* 156* 139*  BILITOT 1.0 1.1 0.9   Estimated Creatinine Clearance: 15.7 mL/min (by C-G formula based on Cr of 4.66).   Recent Labs  08/19/14 1801 08/19/14 2359 08/20/14 0621  GLUCAP 148* 129* 123*   Insulin Requirements in the past 24 hours:  3 unit Novolog SSI since 1800 last night  Current Nutrition:  Clinimix E5/15 at 146m/hr and 20% IVFE at 181mhr  Vital AF 1.2 at 2081mr  Nutritional Goals: per RD assessment 5/12 2100-2300 kCal, 120-140 grams of protein per day Goal: Clinimix 5/15 at 100m45m + 20% IVFE 10ml54mto provide 2184 kcal and 120 gm protein  Assessment: 58 YO44with complicated hospital course. S/p closure perforated pyloric ulcer, SBR for ischemic necrosis, diffuse peritonitis on 07/01/14. Pharmacy consulted to provide TPN 4/1-4/15 for nutrition support for prolonged ileus. Pt then transitioned to po diet + supplements. Pt  found to have duodenal fistula 4/26 so now NPO and restarted TPN on 4/27.  GI:DuTM:LYYTKPTWula. NPO.Open abd wound, (+)abd pain. Octreotide drip ordered 5/10 am. Albumin 1.7. Prealbumin remains low at 7.9. Tube feeds started yesterday at 10ml/79mow increasing to 20ml/h37m5/10 Add octreotide drip 5/11 IR placed gastro-jenjunal tube for post-pyloric feeding and drainage of gastric juices 5/12 to start Vital AF 1.2 trickle feedings via J tube at 10 ml/hr 5/13 Continues on trickle feeds at 10 ml/hr 5/14: Possible leak in GJ tube - TFs held. Imaging showed no change in JP position with tips in proper positions however pt still with pre-rectal fluid collection 5/15: TFs still being held. IR to replace drain for fluid collection 5/16: IR percutaneous placement of drain in pelvis with aspiration/drainage   Endo:Hx DM/hypothyroidism - CBGs well controlled on SSI alone, synthroid (new), TSH 7.3 Lytes: Lytes being added and removed to the TPN bag as needed based on labs. Na 130, K 3.7, Mg 2, Phos 4.4, CoCa 9.6 (Ca x Phos = 42) Per discussion with renal-pharmacy to manage K - will replace today.  Renal: New ESRD - started 3/17. HD on TTS. NS kvo, planning HD otday  Heme: Anemia of ESRD - H/H 7.9/25.6, plts 260 - ferric gluconate with HD Pulm: Hx asthma/COPD. 100% 1L [place]. CT negative for PE Cards: HTN, afib - IV metoprolol scheduled, CHADS-VASc =4 (not good  candidate for [facility]). 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  Hepatotobil: Alk phos elevated, other LFTs + Tbili WNL, TG 142 (5/16) Neuro: Hx depression / insomnia - new stroke noted on 3/18.  ID: Afebrile, WBC mildly elevated at 11, s/p treatment for CDiff and s/p multiple courses of abx during admission. Hep B Ag negative. Flagyl resumed empirically on 5/17 when ceftaz was resumed. 5/14 wound cx (abdomen): pseudomonas   5/17 ceftazidime >> 5/17 flagyl >> Best Practices: SCDs, PPI IV TPN Access: PICC line in IR  4/27 TPN start date: 4/1 >> 4/15; restart 4/27>>  Plan:  - Reduce clinimix E5/15 (continuing with electrolytes today as pt will receive HD so levels may go down) to 38m/hr since tube feeds are being titrated up, continue IVFE at 157mhr (with TF, will provide 132gm protein -100% goal, and 2413 Kcal - 105% goal) - Continue MVI + TE in TPN - Continue sensitive SSI - Continue ascorbic acid 1gm in TPN - MD wants a total of 14 days (started in TPN 5/18) - will need to reorder when TPN is discontinued - F/u tolerance of tube feeds and ability to titrate up and discontinue TPN  RaSalome ArntPharmD, BCPS Pager # [phone]/19/2016 8:29 AM

## 2014-08-20 NOTE — Progress Notes (Signed)
[facility] Progress Note   Subjective: alert, breathing is still difficult  Filed Vitals:   08/20/14 0752 08/20/14 0804 08/20/14 0809 08/20/14 0830  BP: 113/73 103/72 106/84 114/78  Pulse: 104 100 102 107  Temp: 97.7 F (36.5 C)     TempSrc: Oral     Resp: 18     Height:      Weight: 99.9 kg (220 lb 3.8 oz)     SpO2: 100%      Exam: Alert, no distress No jvd Chest bilat exp wheezing RRR no MRG Abd large dressing, PEG tube in place, drain RUQ No LE or UE edema Neuro is alert ox 3 HD cath  HD: new start, TTS in hospital. 103 kg on admission, lowest wt ~ 87kg        Assessment: 1. New ESRD TTS for now 2. SOB - wheezing, last cxr neg but wt's rising and edema increased, poss fluid overload 3. Pelvic fluid s/p drain 5/16 4. CVA acute b y MRI 3/19 5. Perf pyloric ulcer SP exlap, w duod fistula 6. Nutrition w G-J tube starting TF's +TNA 7. Volume wt's creeping up, CXR clear 5/17 8. C diff 9. Anemia on darbe, resume aranesp and cont IV Fe load 10. HD access needs revision of AVF when healthier 11. HypoPO4, resolved 12. Parox SVT on IV MTP q 4hrs 13. 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  Plan - HD today and again tomorrow for volume, no hep     Vinson Moselleob Nylia Gavina MD  pager [phone]    cell [phone]  08/20/2014, 8:53 AM     Recent Labs Lab 08/16/14 0641 08/17/14 0430  08/19/14 0551 08/19/14 0730 08/20/14 0500  NA 132* 131*  < > 123* 132* 130*  K 4.4 4.2  < > 3.3* 3.4* 3.7  CL 95* 95*  < > 90* 96* 96*  CO2 28 26  < > 26 28 26   GLUCOSE 127* 68  < > 734* 119* 125*  BUN 45* 71*  < > 45* 50* 67*  CREATININE 3.59* 4.80*  < > 3.53* 3.81* 4.66*  CALCIUM 7.6* 7.9*  < > 7.3* 7.5* 7.6*  PHOS 4.0 6.2*  --   --   --  4.4  < > = values in this interval not displayed.  Recent Labs Lab 08/19/14 0551 08/19/14 0730 08/20/14 0500  AST 28 31 20   ALT 11* 12* 8*  ALKPHOS 140* 156* 139*  BILITOT 1.0 1.1 0.9  PROT 5.8* 6.2* 6.2*  ALBUMIN 1.4* 1.6* 1.5*     Recent Labs Lab 08/17/14 0430 08/18/14 0449 08/19/14 0730 08/20/14 0500  WBC 9.8 8.4 11.7* 11.0*  NEUTROABS 7.3  --  8.5*  --   HGB 11.3* 12.2 8.0* 7.9*  HCT 36.3 38.1 26.1* 25.6*  MCV 90.5 88.4 90.3 89.8  PLT 203 185 240 260   . antiseptic oral rinse  7 mL Mouth Rinse q12n4p  . budesonide (PULMICORT) nebulizer solution  0.25 mg Nebulization BID  . cefTAZidime (FORTAZ)  IV  2 g Intravenous Q T,Th,Sa-HD  . chlorhexidine  15 mL Mouth Rinse BID  . feeding supplement (VITAL AF 1.2 CAL)  1,000 mL Per Tube Q24H  . ferric gluconate (FERRLECIT/NULECIT) IV  125 mg Intravenous Q T,Th,Sa-HD  . insulin aspart  0-9 Units Subcutaneous 4 times per day  . levothyroxine  12.5 mcg Intravenous Daily  . metoprolol  2.5 mg Intravenous 4 times per day  . metronidazole  500 mg Intravenous 3 times  per day  . sodium chloride  10-40 mL Intracatheter Q12H   . Marland Kitchen.TPN (CLINIMIX-E) Adult     And  . fat emulsion    . sodium chloride 10 mL/hr at 08/19/14 0950  . Marland Kitchen.TPN (CLINIMIX-E) Adult 100 mL/hr at 08/19/14 1735   And  . fat emulsion 240 mL (08/19/14 1735)  . octreotide  (SANDOSTATIN)    IV infusion 50 mcg/hr (08/20/14 0320)   bisacodyl, camphor-menthol, diphenhydrAMINE, diphenhydrAMINE-zinc acetate, HYDROmorphone (DILAUDID) injection, levalbuterol, ondansetron (ZOFRAN) IV, sodium chloride

## 2014-08-20 NOTE — Progress Notes (Signed)
INFECTIOUS DISEASE PROGRESS NOTE  ID: [name] is a 59 y.o. female with  Principal Problem:   Acute respiratory failure with hypoxia Active Problems:   Cerebral thrombosis with cerebral infarction   Hypertension   ESRD needing dialysis   Obesity (BMI 30-39.9)   Depression   Left renal mass   Chronic diastolic heart failure   H1N1 influenza   Tobacco use disorder   Renal failure (ARF), acute on chronic   Hyperlipidemia   PSVT (paroxysmal supraventricular tachycardia)   SOB (shortness of breath)   Perforated gastric ulcer   Abnormal TSH   History of intermediate V/Q scan   HIT (heparin-induced thrombocytopenia)   Fistula   Abdominal pain   Palliative care encounter   ESRD (end stage renal disease)   SVT (supraventricular tachycardia)   Right heart failure   Duodenal anastomotic leak   Protein calorie malnutrition   Itching   Aortic aneurysm   Protein-calorie malnutrition  Subjective: No problems with HD this AM. Feels fine  Abtx:  Anti-infectives    Start     Dose/Rate Route Frequency Ordered Stop   08/18/14 1300  metroNIDAZOLE (FLAGYL) IVPB 500 mg     500 mg 100 mL/hr over 60 Minutes Intravenous 3 times per day 08/18/14 1045     08/18/14 1200  cefTAZidime (FORTAZ) 2 g in dextrose 5 % 50 mL IVPB     2 g 100 mL/hr over 30 Minutes Intravenous Every T-Th-Sa (Hemodialysis) 08/18/14 1054     08/12/14 1449  ceFAZolin (ANCEF) 2-3 GM-% IVPB SOLR    Comments:  Troy Sine   : cabinet override      08/12/14 1449 08/12/14 1629   08/12/14 1130  ceFAZolin (ANCEF) IVPB 2 g/50 mL premix    Comments:  Hang ON CALL to xray 5/11   2 g 100 mL/hr over 30 Minutes Intravenous To Radiology 08/12/14 1035 08/12/14 1520   08/04/14 1200  vancomycin (VANCOCIN) 50 mg/mL oral solution 125 mg  Status:  Discontinued     125 mg Oral 4 times per day 08/04/14 0851 08/08/14 1229   07/18/14 1800  vancomycin (VANCOCIN) 50 mg/mL oral solution 125 mg  Status:  Discontinued     125 mg Oral  4 times per day 07/18/14 1455 08/04/14 0845   07/17/14 1830  vancomycin (VANCOCIN) IVPB 750 mg/150 ml premix     750 mg 150 mL/hr over 60 Minutes Intravenous  Once 07/17/14 1818 07/18/14 0024   07/15/14 1400  metroNIDAZOLE (FLAGYL) tablet 500 mg  Status:  Discontinued     500 mg Oral 3 times per day 07/15/14 1202 07/18/14 1533   07/13/14 2200  piperacillin-tazobactam (ZOSYN) IVPB 2.25 g  Status:  Discontinued     2.25 g 100 mL/hr over 30 Minutes Intravenous 3 times per day 07/13/14 1318 07/24/14 1138   07/13/14 2000  fluconazole (DIFLUCAN) IVPB 200 mg  Status:  Discontinued     200 mg 100 mL/hr over 60 Minutes Intravenous Every 24 hours 07/13/14 1318 07/19/14 1027   07/07/14 1200  vancomycin (VANCOCIN) IVPB 1000 mg/200 mL premix  Status:  Discontinued     1,000 mg 200 mL/hr over 60 Minutes Intravenous Every 24 hours 07/07/14 0937 07/14/14 1712   07/06/14 2000  fluconazole (DIFLUCAN) IVPB 400 mg  Status:  Discontinued     400 mg 100 mL/hr over 120 Minutes Intravenous Every 24 hours 07/06/14 1507 07/13/14 1318   07/06/14 1800  piperacillin-tazobactam (ZOSYN) IVPB 2.25 g  Status:  Discontinued     2.25 g 100 mL/hr over 30 Minutes Intravenous 4 times per day 07/06/14 1505 07/13/14 1318   07/05/14 2000  fluconazole (DIFLUCAN) IVPB 200 mg  Status:  Discontinued     200 mg 100 mL/hr over 60 Minutes Intravenous Every 24 hours 07/05/14 0757 07/06/14 1507   07/05/14 1400  piperacillin-tazobactam (ZOSYN) IVPB 2.25 g  Status:  Discontinued     2.25 g 100 mL/hr over 30 Minutes Intravenous 3 times per day 07/05/14 0749 07/06/14 1505   07/02/14 1800  vancomycin (VANCOCIN) IVPB 1000 mg/200 mL premix  Status:  Discontinued     1,000 mg 200 mL/hr over 60 Minutes Intravenous Every 24 hours 07/01/14 1858 07/05/14 0801   07/01/14 2200  piperacillin-tazobactam (ZOSYN) IVPB 3.375 g  Status:  Discontinued     3.375 g 100 mL/hr over 30 Minutes Intravenous 4 times per day 07/01/14 1858 07/05/14 0749    07/01/14 2000  fluconazole (DIFLUCAN) IVPB 400 mg  Status:  Discontinued     400 mg 100 mL/hr over 120 Minutes Intravenous Every 24 hours 07/01/14 1858 07/05/14 0757   07/01/14 1400  fluconazole (DIFLUCAN) IVPB 200 mg  Status:  Discontinued     200 mg 100 mL/hr over 60 Minutes Intravenous Every 24 hours 07/01/14 1330 07/01/14 1853   07/01/14 1000  piperacillin-tazobactam (ZOSYN) IVPB 2.25 g  Status:  Discontinued     2.25 g 100 mL/hr over 30 Minutes Intravenous Every 8 hours 07/01/14 0952 07/01/14 1853   07/01/14 1000  vancomycin (VANCOCIN) 500 mg in sodium chloride 0.9 % 100 mL IVPB     500 mg 100 mL/hr over 60 Minutes Intravenous  Once 07/01/14 0952 07/01/14 1126   06/30/14 1200  vancomycin (VANCOCIN) IVPB 1000 mg/200 mL premix     1,000 mg 200 mL/hr over 60 Minutes Intravenous  Once 06/30/14 0944 06/30/14 1403   06/30/14 1200  ceFEPIme (MAXIPIME) 2 g in dextrose 5 % 50 mL IVPB     2 g 100 mL/hr over 30 Minutes Intravenous  Once 06/30/14 0944 06/30/14 1425   06/30/14 0100  vancomycin (VANCOCIN) 2,000 mg in sodium chloride 0.9 % 500 mL IVPB     2,000 mg 250 mL/hr over 120 Minutes Intravenous  Once 06/30/14 0048 06/30/14 0525   06/30/14 0100  ceFEPIme (MAXIPIME) 2 g in dextrose 5 % 50 mL IVPB     2 g 100 mL/hr over 30 Minutes Intravenous  Once 06/30/14 0048 06/30/14 0322   06/27/14 0600  cefUROXime (ZINACEF) 1.5 g in dextrose 5 % 50 mL IVPB     1.5 g 100 mL/hr over 30 Minutes Intravenous On call to O.R. 06/26/14 1019 06/27/14 0630   06/19/14 1800  oseltamivir (TAMIFLU) capsule 30 mg     30 mg Oral Every M-W-F (1800) 06/19/14 1003 06/22/14 1841   06/18/14 1600  oseltamivir (TAMIFLU) capsule 30 mg  Status:  Discontinued     30 mg Oral Daily 06/18/14 1538 06/19/14 1003      Medications:  Scheduled: . antiseptic oral rinse  7 mL Mouth Rinse q12n4p  . budesonide (PULMICORT) nebulizer solution  0.25 mg Nebulization BID  . cefTAZidime (FORTAZ)  IV  2 g Intravenous Q T,Th,Sa-HD  .  chlorhexidine  15 mL Mouth Rinse BID  . darbepoetin (ARANESP) injection - DIALYSIS  100 mcg Intravenous Q Thu-HD  . feeding supplement (VITAL AF 1.2 CAL)  1,000 mL Per Tube Q24H  . ferric gluconate (FERRLECIT/NULECIT) IV  125 mg Intravenous Q  T,Th,Sa-HD  . insulin aspart  0-9 Units Subcutaneous 4 times per day  . levothyroxine  12.5 mcg Intravenous Daily  . metoprolol  2.5 mg Intravenous 4 times per day  . metronidazole  500 mg Intravenous 3 times per day  . sodium chloride  10-40 mL Intracatheter Q12H    Objective: Vital signs in last 24 hours: Temp:  [97.4 F (36.3 C)-98 F (36.7 C)] 97.4 F (36.3 C) (05/19 1230) Pulse Rate:  [100-115] 110 (05/19 1309) Resp:  [14-23] 15 (05/19 1309) BP: (98-125)/(58-85) 101/69 mmHg (05/19 1309) SpO2:  [96 %-100 %] 100 % (05/19 1309) Weight:  [97.9 kg (215 lb 13.3 oz)-99.9 kg (220 lb 3.8 oz)] 97.9 kg (215 lb 13.3 oz) (05/19 1230)   General appearance: alert, cooperative and no distress Resp: clear to auscultation bilaterally Cardio: regular rate and rhythm GI: normal findings: bowel sounds normal and soft, non-tender Extremities: AVF LUE + bruit  Lab Results  Recent Labs  08/19/14 0730 08/20/14 0500  WBC 11.7* 11.0*  HGB 8.0* 7.9*  HCT 26.1* 25.6*  NA 132* 130*  K 3.4* 3.7  CL 96* 96*  CO2 28 26  BUN 50* 67*  CREATININE 3.81* 4.66*   Liver Panel  Recent Labs  08/19/14 0730 08/20/14 0500  PROT 6.2* 6.2*  ALBUMIN 1.6* 1.5*  AST 31 20  ALT 12* 8*  ALKPHOS 156* 139*  BILITOT 1.1 0.9   Sedimentation Rate No results for input(s): ESRSEDRATE in the last 72 hours. C-Reactive Protein No results for input(s): CRP in the last 72 hours.  Microbiology: Recent Results (from the past 240 hour(s))  Wound culture     Status: None   Collection Time: 08/15/14  6:27 AM  Result Value Ref Range Status   Specimen Description WOUND  Final   Special Requests ABDOMIN  Final   Gram Stain   Final    MODERATE WBC PRESENT, PREDOMINANTLY  PMN NO SQUAMOUS EPITHELIAL CELLS SEEN FEW GRAM NEGATIVE RODS Performed at [facility]    Culture   Final    ABUNDANT PSEUDOMONAS AERUGINOSA Performed at [facility]    Report Status 08/18/2014 FINAL  Final   Organism ID, Bacteria PSEUDOMONAS AERUGINOSA  Final      Susceptibility   Pseudomonas aeruginosa - MIC*    CEFEPIME 16 INTERMEDIATE Intermediate     CEFTAZIDIME 4 SENSITIVE Sensitive     CIPROFLOXACIN <=0.25 SENSITIVE Sensitive     GENTAMICIN 2 SENSITIVE Sensitive     IMIPENEM 1 SENSITIVE Sensitive     PIP/TAZO 16 SENSITIVE Sensitive     TOBRAMYCIN <=1 SENSITIVE Sensitive     * ABUNDANT PSEUDOMONAS AERUGINOSA  Culture, routine-abscess     Status: None (Preliminary result)   Collection Time: 08/17/14  2:51 PM  Result Value Ref Range Status   Specimen Description ABSCESS PELVIS  Final   Special Requests NONE  Final   Gram Stain   Final    FEW WBC PRESENT,BOTH PMN AND MONONUCLEAR NO SQUAMOUS EPITHELIAL CELLS SEEN NO ORGANISMS SEEN Performed at [facility]    Culture   Final    NO GROWTH 2 DAYS Performed at [facility]    Report Status PENDING  Incomplete    Studies/Results: No results found.   Assessment/Plan: Wound infection Abscess  5-14 pseudomonas Perforated pyloric ulcer Ischemic necrosis ileum COPD CKD, ESRD on HD HTN Protein calorie malnutrition, severe C diff (4-12)  +HIT  Total days of antibiotics: day 3 ceftaz/flagyl  IR drain placed  5-16 Await this Cx. So far (-) No change in anbx for now.          Johny Sax Infectious Diseases (pager) [phone] [email or web address] 08/20/2014, 3:30 PM  LOS: 64 days

## 2014-08-20 NOTE — Progress Notes (Signed)
[name], female   DOB: [birth year], 59 y.o.   MRN: [id number]     Referring Physician(s): CCM  Subjective:  Pt is s/p placement of perc drain for pelvic abscess. No new complaints.  Allergies: Review of patient's allergies indicates no known allergies.  Medications: Prior to Admission medications   Medication Sig Start Date End Date Taking? Authorizing Provider  allopurinol (ZYLOPRIM) 100 MG tablet Take 100 mg by mouth 2 (two) times daily as needed (for gout).   Yes Historical Provider, MD  citalopram (CELEXA) 20 MG tablet Take 20 mg by mouth daily.   Yes Historical Provider, MD  furosemide (LASIX) 80 MG tablet Take 80 mg by mouth daily.   Yes Historical Provider, MD  hydrOXYzine (ATARAX/VISTARIL) 25 MG tablet Take 25 mg by mouth 3 (three) times daily as needed for anxiety.   Yes Historical Provider, MD  oxyCODONE (ROXICODONE) 5 MG immediate release tablet Take 1 tablet (5 mg total) by mouth every 6 (six) hours as needed for severe pain. Patient not taking: Reported on 06/17/2014 01/28/14   Raymond GurneyKimberly A Trinh, PA-C     Vital Signs: BP 101/69 mmHg  Pulse 110  Temp(Src) 97.4 F (36.3 C) (Oral)  Resp 15  Ht 5\' 6"  (1.676 m)  Wt 215 lb 13.3 oz (97.9 kg)  BMI 34.85 kg/m2  SpO2 100%  Physical Exam  Pt seen while on hemodialysis Awake and Alert Right TG perc drain was NOT in place.  It had been inadvertently removed.  Suture was clipped and removed from skin. 90 mls output recorded prior to removal   Imaging: Dg Chest Port 1 View  08/18/2014   CLINICAL DATA:  Shortness of breath at rest.  Having hemodialysis.  EXAM: PORTABLE CHEST - 1 VIEW  COMPARISON:  08/15/2014.  FINDINGS: The cardiac silhouette remains mildly enlarged. Stable right jugular double-lumen catheter. Clear lungs. The pulmonary vasculature remains prominent. No pleural fluid. Minimal right shoulder degenerative changes.  IMPRESSION: Stable cardiomegaly and pulmonary vascular congestion.    Electronically Signed   By: Beckie SaltsSteven  Reid M.D.   On: 08/18/2014 09:47   Ct Image Guided Fluid Drain By Catheter  08/17/2014   CLINICAL DATA:  Postoperative intra-abdominal abscess, subsequent encounter. Small pelvic fluid collection.  EXAM: CT-GUIDED DRAIN PLACEMENT IN PELVIC FLUID COLLECTION  Physician: Rachelle HoraAdam R. Lowella DandyHenn, MD  FLUOROSCOPY TIME:  None  MEDICATIONS: 50 mcg fentanyl  ANESTHESIA/SEDATION: Patient was monitored by a radiology nurse during the procedure.  PROCEDURE: Informed consent was obtained for drain placement. Patient was placed on her left side. Images through the pelvis were obtained. The right buttock was prepped and draped in sterile fashion. 1% lidocaine was used for a local anesthetic. An 18 gauge needle was directed into the pelvic fluid collection with CT guidance from a transgluteal approach. Thick yellow purulent fluid was aspirated. A stiff Amplatz wire was placed. The tract was dilated to accommodate a 10.2 [place] multipurpose drain. It was technically difficult to advance the drain into the collection due to the small size of the collection and the patient's body habitus. Eventually, the drain was successfully placed within the collection. 5 mL of yellow purulent fluid was obtained. Catheter was sutured to the skin and attached to a suction bulb. Fluid was sent for culture.  FINDINGS: Small fluid collection just posterior to the uterus. A total of 5 mL of yellow purulent fluid was removed.  Estimated blood loss: Minimal  COMPLICATIONS: None  IMPRESSION: CT-guided placement of a drainage catheter in  the small pelvic fluid collection.   Electronically Signed   By: Richarda OverlieAdam  Henn M.D.   On: 08/17/2014 17:21    Labs:  CBC:  Recent Labs  08/17/14 0430 08/18/14 0449 08/19/14 0730 08/20/14 0500  WBC 9.8 8.4 11.7* 11.0*  HGB 11.3* 12.2 8.0* 7.9*  HCT 36.3 38.1 26.1* 25.6*  PLT 203 185 240 260    COAGS:  Recent Labs  07/02/14 0430  07/04/14 0510 07/07/14 0400 07/10/14 0715  08/12/14 1101 08/14/14 0615  INR 1.51*  --   --   --  1.21 1.19 1.28  APTT 35  < > 36 41* 37 36  --   < > = values in this interval not displayed.  BMP:  Recent Labs  08/18/14 0449 08/19/14 0551 08/19/14 0730 08/20/14 0500  NA 129* 123* 132* 130*  K 4.5 3.3* 3.4* 3.7  CL 93* 90* 96* 96*  CO2 24 26 28 26   GLUCOSE 125* 734* 119* 125*  BUN 98* 45* 50* 67*  CALCIUM 7.7* 7.3* 7.5* 7.6*  CREATININE 5.84* 3.53* 3.81* 4.66*  GFRNONAA 7* 13* 12* 9*  GFRAA 8* 15* 14* 11*    LIVER FUNCTION TESTS:  Recent Labs  08/18/14 0449 08/19/14 0551 08/19/14 0730 08/20/14 0500  BILITOT 1.2 1.0 1.1 0.9  AST 24 28 31 20   ALT 8* 11* 12* 8*  ALKPHOS 163* 140* 156* 139*  PROT 6.2* 5.8* 6.2* 6.2*  ALBUMIN 1.5* 1.4* 1.6* 1.5*    Assessment and Plan:  Inadvertent removal of Perc Drain from the right gluteal area. Recommend repeat CT scan to evaluate abscess   Signed: Golden PopBLAIR, WENDY R 08/20/2014, 1:14 PM   I spent a total of 15 minutes in face to face in clinical consultation/evaluation, greater than 50% of which was counseling/coordinating care for Perc Drain.

## 2014-08-20 NOTE — Progress Notes (Signed)
[name] JXB:[id number] DOB: [birth year] DOA: 06/17/2014 PCP: Stephanie BladeEAN, [name], MD   Brief Narrative: 59 yo ? smoker with hx HTN, COPD, CKD IV followed @ [facility]-Dr. Lyn HollingsheadAlexander [name], failed L AV fistula, initially admitted 3/16 with flu and acute hypoxic resp failure. Ultimately tx to [facility] due to need for HD. L sided weakness 3/18 and found to have R ACA stroke-showed acute infarct R ACA. Patient more lethargic 3/30 and CT abd=perf viscus 3/31. She was taken to the OR for ex lap for perforated pyloric ulcer with diffuse peritonitis, ischemic necrosis of mid ileum. Patient developed septic shock on 4-1, she was started on pressors and wean off pressors on 4-3. Subsequently she was diagnosed with pelvic abscess by CT scan perform on 4-7. She underwent pelvic drain placement by IR. C. diff diagnosed 4-12-initially unresponsive to flagyl, and she was started on vancomycin 4-16. Develops  run of SVT. Cardiology was helping with management. Course complicated with high RV pressure, CT angio negative for PE.  Also with high output fistula formation between anastomosis and drain, making her back to NPO state and in need of TPN. Palliative care consulted. On 5/2 she developed hypoxemia, hypotension and SVT during dialysis treatment. Dialysis was stopped. She received IV bolus. She was started on BIPAP. She was transferred to Step down unit. PCCM and cardiology re consulted. Her Hr decreased, BP improved. She was taken off BIPAP. This recurred on 5/15 with episodic SVT.  Now stable, on TNA + very high output from fistula/leak per [facility]. Had a G_J tube placed by Dr. Deanne [name] of IR and Gen surgery has continued to assist with management of her multiple surgical comorbidities. CT repeat 5/14 showed 5 cm abscess-transgluteal drain with 5 ml of purulent fluid removed on 5/17.  CIR is interested in taking her if she can stabilize enough from her medical conditions  Subjective: Patient fell yesterday afternoon. Denies any  injuries. Her left knee was hurting her, but she is able to bend it without any difficulty. Denies any headaches. Otherwise doesn't feel any different from yesterday.   Assessment/Plan:  Status post exploratory laparotomy with closure of perforated pyloric ulcer/abdominal abscess / small bowel resection w/ postop ileus and now with fistula. Pelvic abscess s/p percutaneous drain placement 4/8; had leakage from anastomosis site. Received Zosyn for 23 days. Chronic low grade leukocytosis range 13-15 -IV antibiotics were discontinued by Surgery on 4/22. Also received 18 days of Vancomycin and fluconazole.  -Due to Fistula is back to NPO state, TPN. Trickle feeds being attempted. -Fistula study done shows JP drain connection to the duodenal bulb-5-02. - repeat CT abdomen with  3.0 x 3.5 x 5.7 cm collection in the pelvic cul-de-sac and extraluminal air alongside JP drain in anterior abd wall -Drain with old and new blood, Hb trending down, transfuse 2 unit PRBC 5/10 -continues to have high output from Fistula/leak. Had G_J placed by IR 08/12/14 at request of gen surgery -Patient continues to be on octreotide, which is being managed by surgery.  -CT repeat shows 5 cm post cul-de-sac abscess which was drained on 5/16-culture pending  -ID was consulted as patient had Pseudomonas growing from her culture wounds and they started back Flagyl as well as Elita QuickFortaz on 5/16 and are following and we await final growth from her new abscess drained on 5/16. - Discussed with Dr. Lindie [name] today. Plan is to go up on the feeding rate. Drains to remain for now. In the future patient may require bedside debridement of her  abdominal wound.  PSVT/Multiple episoded 5/2 and 5/15 and 5/17  -Felt to be secondary to severe anxiety with dialysis   -Has been evaluated by EP.  -Cardiology following peripherally- follow K+ and Mg  -Continue IV metoprolol, now off amiodarone;  -V-Q scan with intermediate probability for pulmonary  embolism.  -CTA 4/27, negative for PE   -she is hypotensive 2/2 to metoprolol IV but has been stable. Has occasional burst of heart rate into 130s but then comes back to 90-100 range. Give scheduled metoprolol but at lower dose. Holding parameters.  ESRD new, now on HD this admit complicated by intermittent hypotension AKi on CKD 4, due to sepsis, shock Ongoing hemodialysis per Nephrology - perm-cath placed 3/26 d/t AVF problems Patient AV fistula reviewed and not mature yet per Dr. Paulene [name]-will revisit prior to d/c has had some ongoing hypotension-EDW may need to be adjusted as per Nephrology  C. difficile colitis still on contact precautions -C diff positive on 4-12, Received 4 days of flagyl without improvement, then started on PO vanc -Completed 3 weeks of Oral Vanc-stopped 5/7, this was 1 week after IV Zosyn was stopped 5/5 -Diarrhea resolved -keep contact precautions on board   ACA CVA with Lt sided weakness -TEE ;normal LV function; no LAA thrombus -Started on ASA 81mg  per Neuro -Will need ongoing long term rehab - PT/OT to cont to follow while inpatient -Patient remains very weak and deconditioned. Will potentially need CIR when medically stable  Acute hypoxic Respiratory Failure:  -This was secondary to H1N1, hypervolemia, HCAP >> resolved.  -also has COPD and PAH -improving, now weaning off O2 -Nebulizer PRN and CPAP QHS   Septic shock  -due to perforated prepyloric ulcer, required pressors -resolved currently  Chronic diastolic congestive heart failure w/ Severe RHF EF 60-65%, grade 1 diastolic dysfunction. Fluids/volume managed with hemodialysis.  Elevated PA pressures on 2-D echo; CTA negative for PE  Small PFO Has been evaluated by Cardiology  Anemia of critical illness/chronic disease -and bleeding from leak/fistula -s/p 2U PRBC 4/13 and 5/10 Aranesp/iron per renal -hb stable  Thrombocytopenia; resolved.  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 On TPN now, CBGs stabilizedin 120-160s. Hba1c 6.5  Hypokalemia Being addressed via TPN  Abnormal TSH -Free T4 WNL, T3 1.8 -most likely sick thyroid with ongoing infection and body stress; also due to amiodarone use. TSH in 7 range -continue low dose synthroid given PSVT, now on IV synthroid   L Renal mass  -noted on US, Indeterminate 1.9 cm hypoechoic mass off the interpolar region of the left kidney -may potentially represent a cyst however solid mass is not excluded -needs  Outpatient FU of this  Obesity - Inadequate oral intake related to altered GI function as evidenced by limited intake, ongoing.  Body mass index is 35.56 kg/(m^2). -patient weight trending down -patient unable to tolerate diet and with leaking fistula  Fall on May 18: Examination does not reveal any injuries. She does have reasonably good range of motion of the left upper extremity and left lower extremity all joints. No need for imaging studies.  DVT prophylaxis: SCDs Code Status: FULL Family Communication:  discussed with family at the bedside  Disposition Plan: Not ready for discharge. CIR is following.  Consultants: Cardiology Nephrology PCCM Gen Surgery  Palliative care  Significant Events: 3/16 influenza A+ 3/18 R ACA stroke 3/19 MRI multifocal acute infarction of both hemispheres, large right distal ACA 3/22 TEE severe RAE, severely reduced RV function, severe TR, no LAA thrombus, small PFO on TEE 3/31 to OR ex lap for per pyloric ulcer and sm bowel resection 4/1 septic shock on pressors 4/3 pressors weaned off  4/4 restart pressors, CRRT 4/6 levo switched to neo 4/7 pelvic abscess on CT 4/8 IR placed pelvic drain 4/9 off pressors 4/11 long run of SVT. 4/23 VQ  scan which demonstrated intermediate probability for PE 4/24 CT angiogram of her chest: Pending/unable to be done due to lack of access    Objective: Blood pressure 114/66, pulse 101, temperature 97.9 F (36.6 C), temperature source Oral, resp. rate 14, height  (1.676 m), weight 99.9 kg (220 lb 3.8 oz), SpO2 99 %.  Intake/Output Summary (Last 24 hours) at 08/20/14 0745 Last data filed at 08/20/14 0700  Gross per 24 hour  Intake 984.17 ml  Output   1550 ml  Net -565.83 ml   Exam: General: AAOx3, tired coherent Lungs: Decreased air entry at the bases. No definite crackles. Cardiovascular: Regular rate, no murmurs and no gallops, sinus tachycardia  Abdomen: Abdominal wound dressing noted-wounds not examined. Multiple drains also noted. G-tube also present.  Full range of motion left shoulder, left elbow, left wrist. Some swelling of the left knee is noted, but she has good range of motion. Also has good range of motion of the left hip.  Data Reviewed: Basic Metabolic Panel:  Recent Labs Lab 08/14/14 0615 08/14/14 1029 08/15/14 0430 08/16/14 0641 08/17/14 0430 08/18/14 0449 08/19/14 0551 08/19/14 0730  NA 128* 134* 131* 132* 131* 129* 123* 132*  K 4.8 3.7 3.0* 4.4 4.2 4.5 3.3* 3.4*  CL 93* 98* 98* 95* 95* 93* 90* 96*  CO2 GLUCOSE 649* 125* 128* 127* 68 125* 734* 119*  BUN 43* 50* 66* 45* 71* 98* 45* 50*  CREATININE 3.33* 3.59* 4.23* 3.59* 4.80* 5.84* 3.53* 3.81*  CALCIUM 8.0* 8.2* 7.4* 7.6* 7.9* 7.7* 7.3* 7.5*  MG 2.7* 2.5* 2.3 2.2 2.3  --   --   --   PHOS 6.5* 4.8* 3.8 4.0 6.2*  --   --   --     Liver Function Tests:  Recent Labs Lab 08/14/14 0615 08/17/14 0430 08/18/14 0449 08/19/14 0551 08/19/14 0730  AST ALT 10* 9* 8* 11* 12*  ALKPHOS 157* 162* 163* 140* 156*  BILITOT 1.2 1.1 1.2 1.0 1.1  PROT 6.2* 6.3* 6.2* 5.8* 6.2*  ALBUMIN 1.5* 1.6* 1.5* 1.4* 1.6*   CBC:  Recent Labs Lab 08/16/14 0641 08/17/14 0430  08/18/14 0449 08/19/14 0730 08/20/14 0500  WBC 16.0* 9.8 8.4 11.7* 11.0*  NEUTROABS  --  7.3  --  8.5*  --   HGB 8.6* 11.3* 12.2 8.0* 7.9*  HCT 28.2* 36.3 38.1 26.1* 25.6*  MCV 90.4 90.5 88.4 90.3 89.8  PLT 250 203 185 240 260    CBG:  Recent Labs Lab 08/19/14 0603 08/19/14 1134 08/19/14 1801 08/19/14 2359 08/20/14 0621  GLUCAP 131* 123* 148* 129* 123*    Scheduled Meds:  Scheduled Meds: . antiseptic oral rinse  7 mL Mouth Rinse q12n4p  . budesonide (PULMICORT) nebulizer solution  0.25 mg Nebulization BID  . cefTAZidime (FORTAZ)  IV  2 g Intravenous Q T,Th,Sa-HD  . chlorhexidine  15 mL Mouth Rinse  BID  . ferric gluconate (FERRLECIT/NULECIT) IV  125 mg Intravenous Q T,Th,Sa-HD  . insulin aspart  0-9 Units Subcutaneous 4 times per day  . levothyroxine  12.5 mcg Intravenous Daily  . metoprolol  2.5 mg Intravenous 4 times per day  . metronidazole  500 mg Intravenous 3 times per day  . sodium chloride  10-40 mL Intracatheter Q12H    Time spent on care of this patient:   Obdulio Mash [facility] (P) [phone]   If 7PM-7AM, please contact night-coverage [email or web address] Password [email or web address] 08/20/2014, 7:45 AM   LOS: 64 days

## 2014-08-20 NOTE — Progress Notes (Signed)
RT placed patient on CPAP. Patient was unable to tolerate stating it was taking her breath. RT will continue to monitor.

## 2014-08-20 NOTE — Progress Notes (Signed)
[facility] Progress Note 50 Days Post-Op  Subjective: Patient conversant.  No distress.  Wants to be able to eat peaches.  Objective: Vital signs in last 24 hours: Temp:  [97.2 F (36.2 C)-98 F (36.7 C)] 97.9 F (36.6 C) (05/19 0400) Pulse Rate:  [101-106] 101 (05/19 0400) Resp:  [14-25] 14 (05/19 0400) BP: (98-126)/(58-89) 114/66 mmHg (05/19 0400) SpO2:  [96 %-100 %] 99 % (05/19 0000) Weight:  [99.9 kg (220 lb 3.8 oz)] 99.9 kg (220 lb 3.8 oz) (05/19 0337) Last BM Date: 08/19/14  Intake/Output from previous day: 05/18 0701 - 05/19 0700 In: 984.2 [I.V.:660; NG/GT:124.2; IV Piggyback:200] Out: 1550 [Drains:1550] Intake/Output this shift:    General: No acute distress.    Lungs: Clear  Abd: RUQ drain on suction and putting out up to a liter of bilious drainage daily on suction.  Gastrostomy put out minimal  per day.  Midline wound has some necrosis at the base which can be debrided at any time at the bedside.  Extremities: No changes  Neuro: Intact  Lab Results:  @LABLAST2 (wbc:2,hgb:2,hct:2,plt:2) BMET  Recent Labs  08/19/14 0551 08/19/14 0730  NA 123* 132*  K 3.3* 3.4*  CL 90* 96*  CO2 26 28  GLUCOSE 734* 119*  BUN 45* 50*  CREATININE 3.53* 3.81*  CALCIUM 7.3* 7.5*   PT/INR No results for input(s): LABPROT, INR in the last 72 hours. ABG No results for input(s): PHART, HCO3 in the last 72 hours.  Invalid input(s): PCO2, PO2  Studies/Results: Dg Chest Port 1 View  08/18/2014   CLINICAL DATA:  Shortness of breath at rest.  Having hemodialysis.  EXAM: PORTABLE CHEST - 1 VIEW  COMPARISON:  08/15/2014.  FINDINGS: The cardiac silhouette remains mildly enlarged. Stable right jugular double-lumen catheter. Clear lungs. The pulmonary vasculature remains prominent. No pleural fluid. Minimal right shoulder degenerative changes.  IMPRESSION: Stable cardiomegaly and pulmonary vascular congestion.   Electronically Signed   By: Beckie SaltsSteven  Reid M.D.   On: 08/18/2014 09:47     Anti-infectives: Anti-infectives    Start     Dose/Rate Route Frequency Ordered Stop   08/18/14 1300  metroNIDAZOLE (FLAGYL) IVPB 500 mg     500 mg 100 mL/hr over 60 Minutes Intravenous 3 times per day 08/18/14 1045     08/18/14 1200  cefTAZidime (FORTAZ) 2 g in dextrose 5 % 50 mL IVPB     2 g 100 mL/hr over 30 Minutes Intravenous Every T-Th-Sa (Hemodialysis) 08/18/14 1054     08/12/14 1449  ceFAZolin (ANCEF) 2-3 GM-% IVPB SOLR    Comments:  Troy SineLaffan, Eileen   : cabinet override      08/12/14 1449 08/12/14 1629   08/12/14 1130  ceFAZolin (ANCEF) IVPB 2 g/50 mL premix    Comments:  Hang ON CALL to xray 5/11   2 g 100 mL/hr over 30 Minutes Intravenous To Radiology 08/12/14 1035 08/12/14 1520   08/04/14 1200  vancomycin (VANCOCIN) 50 mg/mL oral solution 125 mg  Status:  Discontinued     125 mg Oral 4 times per day 08/04/14 0851 08/08/14 1229   07/18/14 1800  vancomycin (VANCOCIN) 50 mg/mL oral solution 125 mg  Status:  Discontinued     125 mg Oral 4 times per day 07/18/14 1455 08/04/14 0845   07/17/14 1830  vancomycin (VANCOCIN) IVPB 750 mg/150 ml premix     750 mg 150 mL/hr over 60 Minutes Intravenous  Once 07/17/14 1818 07/18/14 0024   07/15/14 1400  metroNIDAZOLE (FLAGYL) tablet 500 mg  Status:  Discontinued     500 mg Oral 3 times per day 07/15/14 1202 07/18/14 1533   07/13/14 2200  piperacillin-tazobactam (ZOSYN) IVPB 2.25 g  Status:  Discontinued     2.25 g 100 mL/hr over 30 Minutes Intravenous 3 times per day 07/13/14 1318 07/24/14 1138   07/13/14 2000  fluconazole (DIFLUCAN) IVPB 200 mg  Status:  Discontinued     200 mg 100 mL/hr over 60 Minutes Intravenous Every 24 hours 07/13/14 1318 07/19/14 1027   07/07/14 1200  vancomycin (VANCOCIN) IVPB 1000 mg/200 mL premix  Status:  Discontinued     1,000 mg 200 mL/hr over 60 Minutes Intravenous Every 24 hours 07/07/14 0937 07/14/14 1712   07/06/14 2000  fluconazole (DIFLUCAN) IVPB 400 mg  Status:  Discontinued     400 mg 100  mL/hr over 120 Minutes Intravenous Every 24 hours 07/06/14 1507 07/13/14 1318   07/06/14 1800  piperacillin-tazobactam (ZOSYN) IVPB 2.25 g  Status:  Discontinued     2.25 g 100 mL/hr over 30 Minutes Intravenous 4 times per day 07/06/14 1505 07/13/14 1318   07/05/14 2000  fluconazole (DIFLUCAN) IVPB 200 mg  Status:  Discontinued     200 mg 100 mL/hr over 60 Minutes Intravenous Every 24 hours 07/05/14 0757 07/06/14 1507   07/05/14 1400  piperacillin-tazobactam (ZOSYN) IVPB 2.25 g  Status:  Discontinued     2.25 g 100 mL/hr over 30 Minutes Intravenous 3 times per day 07/05/14 0749 07/06/14 1505   07/02/14 1800  vancomycin (VANCOCIN) IVPB 1000 mg/200 mL premix  Status:  Discontinued     1,000 mg 200 mL/hr over 60 Minutes Intravenous Every 24 hours 07/01/14 1858 07/05/14 0801   07/01/14 2200  piperacillin-tazobactam (ZOSYN) IVPB 3.375 g  Status:  Discontinued     3.375 g 100 mL/hr over 30 Minutes Intravenous 4 times per day 07/01/14 1858 07/05/14 0749   07/01/14 2000  fluconazole (DIFLUCAN) IVPB 400 mg  Status:  Discontinued     400 mg 100 mL/hr over 120 Minutes Intravenous Every 24 hours 07/01/14 1858 07/05/14 0757   07/01/14 1400  fluconazole (DIFLUCAN) IVPB 200 mg  Status:  Discontinued     200 mg 100 mL/hr over 60 Minutes Intravenous Every 24 hours 07/01/14 1330 07/01/14 1853   07/01/14 1000  piperacillin-tazobactam (ZOSYN) IVPB 2.25 g  Status:  Discontinued     2.25 g 100 mL/hr over 30 Minutes Intravenous Every 8 hours 07/01/14 0952 07/01/14 1853   07/01/14 1000  vancomycin (VANCOCIN) 500 mg in sodium chloride 0.9 % 100 mL IVPB     500 mg 100 mL/hr over 60 Minutes Intravenous  Once 07/01/14 0952 07/01/14 1126   06/30/14 1200  vancomycin (VANCOCIN) IVPB 1000 mg/200 mL premix     1,000 mg 200 mL/hr over 60 Minutes Intravenous  Once 06/30/14 0944 06/30/14 1403   06/30/14 1200  ceFEPIme (MAXIPIME) 2 g in dextrose 5 % 50 mL IVPB     2 g 100 mL/hr over 30 Minutes Intravenous  Once 06/30/14  0944 06/30/14 1425   06/30/14 0100  vancomycin (VANCOCIN) 2,000 mg in sodium chloride 0.9 % 500 mL IVPB     2,000 mg 250 mL/hr over 120 Minutes Intravenous  Once 06/30/14 0048 06/30/14 0525   06/30/14 0100  ceFEPIme (MAXIPIME) 2 g in dextrose 5 % 50 mL IVPB     2 g 100 mL/hr over 30 Minutes Intravenous  Once 06/30/14 0048 06/30/14 0322   06/27/14 0600  cefUROXime (ZINACEF) 1.5  g in dextrose 5 % 50 mL IVPB     1.5 g 100 mL/hr over 30 Minutes Intravenous On call to O.R. 06/26/14 1019 06/27/14 0630   06/19/14 1800  oseltamivir (TAMIFLU) capsule 30 mg     30 mg Oral Every M-W-F (1800) 06/19/14 1003 06/22/14 1841   06/18/14 1600  oseltamivir (TAMIFLU) capsule 30 mg  Status:  Discontinued     30 mg Oral Daily 06/18/14 1538 06/19/14 1003      Assessment/Plan: s/p Procedure(s): EXPLORATORY LAPAROTOMY WITH CLOSURE OF PERFORATED PYLORIC ULCER SMALL BOWEL RESECTION Increase tube feedings to 20cc/hr  No other changes.  LOS: 64 days   Marta Lamas. Gae Bon, MD, FACS [phone] [facility] Surgery 08/20/2014

## 2014-08-20 NOTE — Progress Notes (Signed)
Pt refuses to wear CPAP. RT will monitor. 

## 2014-08-21 ENCOUNTER — Inpatient Hospital Stay (HOSPITAL_COMMUNITY): Payer: Medicaid Other

## 2014-08-21 LAB — COMPREHENSIVE METABOLIC PANEL
ALBUMIN: 1.6 g/dL — AB (ref 3.5–5.0)
ALT: 9 U/L — AB (ref 14–54)
AST: 25 U/L (ref 15–41)
Alkaline Phosphatase: 151 U/L — ABNORMAL HIGH (ref 38–126)
Anion gap: 9 (ref 5–15)
BUN: 34 mg/dL — ABNORMAL HIGH (ref 6–20)
CALCIUM: 7.8 mg/dL — AB (ref 8.9–10.3)
CO2: 25 mmol/L (ref 22–32)
CREATININE: 2.98 mg/dL — AB (ref 0.44–1.00)
Chloride: 98 mmol/L — ABNORMAL LOW (ref 101–111)
GFR calc Af Amer: 19 mL/min — ABNORMAL LOW (ref >60)
GFR calc non Af Amer: 16 mL/min — ABNORMAL LOW (ref >60)
Glucose, Bld: 123 mg/dL — ABNORMAL HIGH (ref 65–99)
Potassium: 4.2 mmol/L (ref 3.5–5.1)
Sodium: 132 mmol/L — ABNORMAL LOW (ref 135–145)
TOTAL PROTEIN: 6.4 g/dL — AB (ref 6.5–8.1)
Total Bilirubin: 0.8 mg/dL (ref 0.3–1.2)

## 2014-08-21 LAB — CBC
HCT: 27.6 % — ABNORMAL LOW (ref 36.0–46.0)
HCT: 26.4 % — ABNORMAL LOW (ref 36.0–46.0)
HCT: 25.2 % — ABNORMAL LOW (ref 36.0–46.0)
Hemoglobin: 8.5 g/dL — ABNORMAL LOW (ref 12.0–15.0)
Hemoglobin: 8.1 g/dL — ABNORMAL LOW (ref 12.0–15.0)
Hemoglobin: 7.9 g/dL — ABNORMAL LOW (ref 12.0–15.0)
MCH: 27.9 pg (ref 26.0–34.0)
MCH: 28.2 pg (ref 26.0–34.0)
MCH: 28.8 pg (ref 26.0–34.0)
MCHC: 30.8 g/dL (ref 30.0–36.0)
MCHC: 30.7 g/dL (ref 30.0–36.0)
MCHC: 31.3 g/dL (ref 30.0–36.0)
MCV: 90.5 fL (ref 78.0–100.0)
MCV: 92 fL (ref 78.0–100.0)
MCV: 92 fL (ref 78.0–100.0)
PLATELETS: 276 10*3/uL (ref 150–400)
Platelets: 328 10*3/uL (ref 150–400)
Platelets: 262 10*3/uL (ref 150–400)
RBC: 3.05 MIL/uL — AB (ref 3.87–5.11)
RBC: 2.87 MIL/uL — ABNORMAL LOW (ref 3.87–5.11)
RBC: 2.74 MIL/uL — ABNORMAL LOW (ref 3.87–5.11)
RDW: 20 % — ABNORMAL HIGH (ref 11.5–15.5)
RDW: 20 % — AB (ref 11.5–15.5)
RDW: 20.1 % — ABNORMAL HIGH (ref 11.5–15.5)
WBC: 12.4 10*3/uL — AB (ref 4.0–10.5)
WBC: 13.4 10*3/uL — ABNORMAL HIGH (ref 4.0–10.5)
WBC: 12.1 10*3/uL — ABNORMAL HIGH (ref 4.0–10.5)

## 2014-08-21 LAB — RENAL FUNCTION PANEL
Albumin: 1.6 g/dL — ABNORMAL LOW (ref 3.5–5.0)
Albumin: 2 g/dL — ABNORMAL LOW (ref 3.5–5.0)
Anion gap: 9 (ref 5–15)
Anion gap: 6 (ref 5–15)
BUN: 39 mg/dL — AB (ref 6–20)
BUN: 11 mg/dL (ref 6–20)
CALCIUM: 7.8 mg/dL — AB (ref 8.9–10.3)
CO2: 27 mmol/L (ref 22–32)
CO2: 29 mmol/L (ref 22–32)
CREATININE: 3.2 mg/dL — AB (ref 0.44–1.00)
Calcium: 7.7 mg/dL — ABNORMAL LOW (ref 8.9–10.3)
Chloride: 96 mmol/L — ABNORMAL LOW (ref 101–111)
Chloride: 101 mmol/L (ref 101–111)
Creatinine, Ser: 1.71 mg/dL — ABNORMAL HIGH (ref 0.44–1.00)
GFR calc Af Amer: 37 mL/min — ABNORMAL LOW (ref >60)
GFR calc non Af Amer: 15 mL/min — ABNORMAL LOW (ref >60)
GFR calc non Af Amer: 32 mL/min — ABNORMAL LOW (ref >60)
GFR, EST AFRICAN AMERICAN: 17 mL/min — AB (ref >60)
Glucose, Bld: 138 mg/dL — ABNORMAL HIGH (ref 65–99)
Glucose, Bld: 107 mg/dL — ABNORMAL HIGH (ref 65–99)
PHOSPHORUS: 3.9 mg/dL (ref 2.5–4.6)
Phosphorus: 2.1 mg/dL — ABNORMAL LOW (ref 2.5–4.6)
Potassium: 4.1 mmol/L (ref 3.5–5.1)
Potassium: 4.1 mmol/L (ref 3.5–5.1)
SODIUM: 132 mmol/L — AB (ref 135–145)
Sodium: 136 mmol/L (ref 135–145)

## 2014-08-21 LAB — GLUCOSE, CAPILLARY
GLUCOSE-CAPILLARY: 100 mg/dL — AB (ref 65–99)
GLUCOSE-CAPILLARY: 126 mg/dL — AB (ref 65–99)
Glucose-Capillary: 126 mg/dL — ABNORMAL HIGH (ref 65–99)
Glucose-Capillary: 138 mg/dL — ABNORMAL HIGH (ref 65–99)

## 2014-08-21 LAB — CULTURE, ROUTINE-ABSCESS: Culture: NO GROWTH

## 2014-08-21 LAB — MAGNESIUM: Magnesium: 1.9 mg/dL (ref 1.7–2.4)

## 2014-08-21 LAB — PHOSPHORUS: Phosphorus: 3.6 mg/dL (ref 2.5–4.6)

## 2014-08-21 MED ORDER — ALBUMIN HUMAN 25 % IV SOLN
INTRAVENOUS | Status: AC
  Administered 2014-08-21: 25 g
  Filled 2014-08-21: qty 100

## 2014-08-21 MED ORDER — LIDOCAINE HCL (PF) 1 % IJ SOLN: 5.0000 mL | INTRAMUSCULAR | Status: DC | PRN

## 2014-08-21 MED ORDER — TRACE MINERALS CR-CU-F-FE-I-MN-MO-SE-ZN IV SOLN
INTRAVENOUS | Status: AC
  Administered 2014-08-21: 18:00:00 via INTRAVENOUS
  Filled 2014-08-21: qty 1440

## 2014-08-21 MED ORDER — TRACE MINERALS CR-CU-F-FE-I-MN-MO-SE-ZN IV SOLN
INTRAVENOUS | Status: DC
  Filled 2014-08-21: qty 1920

## 2014-08-21 MED ORDER — ALTEPLASE 2 MG IJ SOLR: 2.0000 mg | Freq: Once | INTRAMUSCULAR | Status: DC | PRN

## 2014-08-21 MED ORDER — NEPRO/CARBSTEADY PO LIQD: 237.0000 mL | ORAL | Status: DC | PRN

## 2014-08-21 MED ORDER — SODIUM CHLORIDE 0.9 % IV SOLN: 100.0000 mL | INTRAVENOUS | Status: DC | PRN

## 2014-08-21 MED ORDER — LIDOCAINE-PRILOCAINE 2.5-2.5 % EX CREA: 1.0000 "application " | TOPICAL_CREAM | CUTANEOUS | Status: DC | PRN

## 2014-08-21 MED ORDER — ALTEPLASE 2 MG IJ SOLR: 2.0000 mg | Freq: Once | INTRAMUSCULAR | Status: AC | PRN

## 2014-08-21 MED ORDER — PENTAFLUOROPROP-TETRAFLUOROETH EX AERO: 1.0000 "application " | INHALATION_SPRAY | CUTANEOUS | Status: DC | PRN

## 2014-08-21 MED ORDER — NEPRO/CARBSTEADY PO LIQD
237.0000 mL | ORAL | Status: DC | PRN
Start: 2014-08-21 — End: 2014-08-22

## 2014-08-21 MED ORDER — FAT EMULSION 20 % IV EMUL
240.0000 mL | INTRAVENOUS | Status: AC
  Administered 2014-08-21: 240 mL via INTRAVENOUS
  Filled 2014-08-21: qty 250

## 2014-08-21 MED ORDER — DEXTROSE 5 % IV SOLN
1.0000 g | Freq: Once | INTRAVENOUS | Status: AC
  Administered 2014-08-21: 1 g via INTRAVENOUS
  Filled 2014-08-21: qty 1

## 2014-08-21 MED ORDER — ANTICOAGULANT SODIUM CITRATE 4% (200MG/5ML) IV SOLN
5.0000 mL | Status: AC
Start: 2014-08-21 — End: 2014-08-21
  Administered 2014-08-21: 5 mL via INTRAVENOUS
  Filled 2014-08-21 (×2): qty 250

## 2014-08-21 MED ORDER — FAT EMULSION 20 % IV EMUL
240.0000 mL | INTRAVENOUS | Status: DC
  Filled 2014-08-21: qty 250

## 2014-08-21 MED ORDER — HEPARIN SODIUM (PORCINE) 1000 UNIT/ML DIALYSIS: 1000.0000 [IU] | INTRAMUSCULAR | Status: DC | PRN

## 2014-08-21 MED ORDER — HYDROMORPHONE HCL 1 MG/ML IJ SOLN
INTRAMUSCULAR | Status: AC
Start: 2014-08-21 — End: 2014-08-21
  Filled 2014-08-21: qty 1

## 2014-08-21 MED ORDER — HYDROMORPHONE HCL 2 MG PO TABS
ORAL_TABLET | ORAL | Status: AC
  Filled 2014-08-21: qty 1

## 2014-08-21 NOTE — Clinical Social Work Note (Signed)
CSW continuing to follow progress of patient.  PT recommends SNF once she is medically ready and able to be discharged.  Ervin KnackEric R. Doretta Remmert, MSW, Theresia MajorsLCSWA [phone] 08/21/2014 4:25 PM

## 2014-08-21 NOTE — Progress Notes (Addendum)
[facility] Progress Note  Assessment/Plan: 1. New ESRD TTS for now, EXTRA treatment today for volume - significant weight variability and nonstanding ability make accurate UF goals difficult- needs serial treatments to get volume down, Na low. K 4.2 - change to 4 K bath because she is returning tomorrow 2. SOB - wheezing, last cxr neg but wt's rising with anasarca; has had inadequate UF with HD judging by flow sheet summaries- getting min volume off on several days; plan extend treatment to 4 hrs today due to volume and increase goal to 3 L with alb for BP support - lower machine temp to 36 3. Pelvic fluid s/p drain 5/16 4. CVA acute b y MRI 3/19 5. Perf pyloric ulcer SP exlap, w duod fistula 6. Nutrition w G-J tube starting TF's (VItal)+trying to wean TNA- alb low - per pharm 7. Volume wt's creeping up, CXR clear 5/17- most edema is central/dependent 8. C diff - s/p po Vanc - no diarrhea at present 9. Anemia  Aranesp 100 q Thurs last 5/19IV Fe load-- Were Hgb's on 5.16 and 5/17 false high at 11.3 and 12.2?? - generally Hgb's in the 8s; 8.5 5/20 10. HD access needs revision of AVF when healthier 11. MBD - iPTH 130 5/12, low P resolved 12. Parox SVT on IV MTP q 4hrs 13. 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 Pseudomonas wound culture 5/16 - on Fortaz/flagyl  Stephanie SliderMartha B Bergman, PA-C [facility] Beeper [phone] 08/21/2014,8:20 AM  LOS: 65 days   Pt seen, examined, agree w assess/plan as above with additions as indicated.  Pt is too volume overloaded, have reduced TNA for now, need to get vol down. HD daily for now.  Vinson Moselleob Uchechukwu Dhawan MD pager [phone]    cell [phone] 08/21/2014, 9:45 AM     Subjective:   C/o abdominal pain, mild SOB  Objective Filed Vitals:   08/21/14 0600 08/21/14 0745 08/21/14 0750 08/21/14 0814  BP: 92/70 132/85 129/78   Pulse: 106 106 107   Temp:  97.9 F (36.6 C)    TempSrc:  Oral    Resp: 24 18 20    Height:      Weight:  102.3 kg  (225 lb 8.5 oz)    SpO2: 95% 100%  100%   Physical Exam General: chronically ill, alert Heart: RRR  Lungs:  Dim BS wheezing is less today Abdomen: obese, dressing in place, J tube, massive dependent edema in lower back, buttocks Extremities: no sig edema +bilat pitting thigh edema Dialysis Access: right IJ and left upper AVF patent  Dialysis Orders: new start, TTS in hospital. 103 kg on admission, lowest wt ~ 87kg  Additional Objective Labs: Basic Metabolic Panel:  Recent Labs Lab 08/17/14 0430  08/19/14 0730 08/20/14 0500 08/21/14 0253  NA 131*  < > 132* 130* 132*  K 4.2  < > 3.4* 3.7 4.2  CL 95*  < > 96* 96* 98*  CO2 26  < > 28 26 25   GLUCOSE 68  < > 119* 125* 123*  BUN 71*  < > 50* 67* 34*  CREATININE 4.80*  < > 3.81* 4.66* 2.98*  CALCIUM 7.9*  < > 7.5* 7.6* 7.8*  PHOS 6.2*  --   --  4.4 3.6  < > = values in this interval not displayed. Liver Function Tests:  Recent Labs Lab 08/19/14 0730 08/20/14 0500 08/21/14 0253  AST 31 20 25   ALT 12* 8* 9*  ALKPHOS 156* 139* 151*  BILITOT 1.1  0.9 0.8  PROT 6.2* 6.2* 6.4*  ALBUMIN 1.6* 1.5* 1.6*    CBC:  Recent Labs Lab 08/17/14 0430 08/18/14 0449 08/19/14 0730 08/20/14 0500 08/21/14 0253  WBC 9.8 8.4 11.7* 11.0* 12.4*  NEUTROABS 7.3  --  8.5*  --   --   HGB 11.3* 12.2 8.0* 7.9* 8.5*  HCT 36.3 38.1 26.1* 25.6* 27.6*  MCV 90.5 88.4 90.3 89.8 90.5  PLT 203 185 240 260 328   Blood Culture    Component Value Date/Time   SDES ABSCESS PELVIS 08/17/2014 1451   SPECREQUEST NONE 08/17/2014 1451   CULT  08/17/2014 1451    NO GROWTH 3 DAYS Performed [facility]    REPTSTATUS 08/21/2014 FINAL 08/17/2014 1451    Cardiac Enzymes:  Recent Labs Lab 08/15/14 1801 08/15/14 2150 08/16/14 0641  TROPONINI 0.10* 0.14* 0.10*   CBG:  Recent Labs Lab 08/20/14 0621 08/20/14 1312 08/20/14 1744 08/21/14 0021 08/21/14 0549  GLUCAP 123* 162* 125* 126* 138*  Medications: . Marland Kitchen.TPN (CLINIMIX-E) Adult 80  mL/hr at 08/20/14 1717   And  . fat emulsion 240 mL (08/20/14 1717)  . Marland Kitchen.TPN (CLINIMIX-E) Adult     And  . fat emulsion    . sodium chloride 10 mL/hr at 08/19/14 0950  . octreotide  (SANDOSTATIN)    IV infusion 50 mcg/hr (08/21/14 0245)   . antiseptic oral rinse  7 mL Mouth Rinse q12n4p  . budesonide (PULMICORT) nebulizer solution  0.25 mg Nebulization BID  . cefTAZidime (FORTAZ)  IV  2 g Intravenous Q T,Th,Sa-HD  . chlorhexidine  15 mL Mouth Rinse BID  . darbepoetin (ARANESP) injection - DIALYSIS  100 mcg Intravenous Q Thu-HD  . feeding supplement (VITAL AF 1.2 CAL)  1,000 mL Per Tube Q24H  . ferric gluconate (FERRLECIT/NULECIT) IV  125 mg Intravenous Q T,Th,Sa-HD  . insulin aspart  0-9 Units Subcutaneous 4 times per day  . levothyroxine  12.5 mcg Intravenous Daily  . metoprolol  2.5 mg Intravenous 4 times per day  . metronidazole  500 mg Intravenous 3 times per day  . sodium chloride  10-40 mL Intracatheter Q12H

## 2014-08-21 NOTE — Progress Notes (Signed)
Pt completed HD tx with no complications. 2L fluid removed. Vss, pt alert. Sodium citrate in catheter d/t patient heparin allergy. Report called and patient returned safely to room.

## 2014-08-21 NOTE — Progress Notes (Signed)
51 Days Post-Op  Subjective: In HD, denies abdominal pain  Objective: Vital signs in last 24 hours: Temp:  [97.4 F (36.3 C)-98.6 F (37 C)] 97.9 F (36.6 C) (05/20 0745) Pulse Rate:  [102-135] 107 (05/20 0750) Resp:  [15-24] 20 (05/20 0750) BP: (92-132)/(60-85) 129/78 mmHg (05/20 0750) SpO2:  [95 %-100 %] 100 % (05/20 0814) Weight:  [97.9 kg (215 lb 13.3 oz)-102.3 kg (225 lb 8.5 oz)] 102.3 kg (225 lb 8.5 oz) (05/20 0745) Last BM Date: 08/19/14  Intake/Output from previous day: 05/19 0701 - 05/20 0700 In: 2284.3 [I.V.:375; NG/GT:239.3; IV Piggyback:100; TPN:1570] Out: 3222 [Drains:720] Intake/Output this shift:    General appearance: alert and cooperative Resp: clear to auscultation bilaterally Cardio: regular rate and rhythm GI: soft, wound cleaner, JP bilious without TF in it  Lab Results:   Recent Labs  08/20/14 0500 08/21/14 0253  WBC 11.0* 12.4*  HGB 7.9* 8.5*  HCT 25.6* 27.6*  PLT 260 328   BMET  Recent Labs  08/20/14 0500 08/21/14 0253  NA 130* 132*  K 3.7 4.2  CL 96* 98*  CO2 26 25  GLUCOSE 125* 123*  BUN 67* 34*  CREATININE 4.66* 2.98*  CALCIUM 7.6* 7.8*   PT/INR No results for input(s): LABPROT, INR in the last 72 hours. ABG No results for input(s): PHART, HCO3 in the last 72 hours.  Invalid input(s): PCO2, PO2  Studies/Results: No results found.  Anti-infectives: Anti-infectives    Start     Dose/Rate Route Frequency Ordered Stop   08/18/14 1300  metroNIDAZOLE (FLAGYL) IVPB 500 mg     500 mg 100 mL/hr over 60 Minutes Intravenous 3 times per day 08/18/14 1045     08/18/14 1200  cefTAZidime (FORTAZ) 2 g in dextrose 5 % 50 mL IVPB     2 g 100 mL/hr over 30 Minutes Intravenous Every T-Th-Sa (Hemodialysis) 08/18/14 1054     08/12/14 1449  ceFAZolin (ANCEF) 2-3 GM-% IVPB SOLR    Comments:  Troy Sine   : cabinet override      08/12/14 1449 08/12/14 1629   08/12/14 1130  ceFAZolin (ANCEF) IVPB 2 g/50 mL premix    Comments:  Hang ON  CALL to xray 5/11   2 g 100 mL/hr over 30 Minutes Intravenous To Radiology 08/12/14 1035 08/12/14 1520   08/04/14 1200  vancomycin (VANCOCIN) 50 mg/mL oral solution 125 mg  Status:  Discontinued     125 mg Oral 4 times per day 08/04/14 0851 08/08/14 1229   07/18/14 1800  vancomycin (VANCOCIN) 50 mg/mL oral solution 125 mg  Status:  Discontinued     125 mg Oral 4 times per day 07/18/14 1455 08/04/14 0845   07/17/14 1830  vancomycin (VANCOCIN) IVPB 750 mg/150 ml premix     750 mg 150 mL/hr over 60 Minutes Intravenous  Once 07/17/14 1818 07/18/14 0024   07/15/14 1400  metroNIDAZOLE (FLAGYL) tablet 500 mg  Status:  Discontinued     500 mg Oral 3 times per day 07/15/14 1202 07/18/14 1533   07/13/14 2200  piperacillin-tazobactam (ZOSYN) IVPB 2.25 g  Status:  Discontinued     2.25 g 100 mL/hr over 30 Minutes Intravenous 3 times per day 07/13/14 1318 07/24/14 1138   07/13/14 2000  fluconazole (DIFLUCAN) IVPB 200 mg  Status:  Discontinued     200 mg 100 mL/hr over 60 Minutes Intravenous Every 24 hours 07/13/14 1318 07/19/14 1027   07/07/14 1200  vancomycin (VANCOCIN) IVPB 1000 mg/200 mL premix  Status:  Discontinued     1,000 mg 200 mL/hr over 60 Minutes Intravenous Every 24 hours 07/07/14 0937 07/14/14 1712   07/06/14 2000  fluconazole (DIFLUCAN) IVPB 400 mg  Status:  Discontinued     400 mg 100 mL/hr over 120 Minutes Intravenous Every 24 hours 07/06/14 1507 07/13/14 1318   07/06/14 1800  piperacillin-tazobactam (ZOSYN) IVPB 2.25 g  Status:  Discontinued     2.25 g 100 mL/hr over 30 Minutes Intravenous 4 times per day 07/06/14 1505 07/13/14 1318   07/05/14 2000  fluconazole (DIFLUCAN) IVPB 200 mg  Status:  Discontinued     200 mg 100 mL/hr over 60 Minutes Intravenous Every 24 hours 07/05/14 0757 07/06/14 1507   07/05/14 1400  piperacillin-tazobactam (ZOSYN) IVPB 2.25 g  Status:  Discontinued     2.25 g 100 mL/hr over 30 Minutes Intravenous 3 times per day 07/05/14 0749 07/06/14 1505    07/02/14 1800  vancomycin (VANCOCIN) IVPB 1000 mg/200 mL premix  Status:  Discontinued     1,000 mg 200 mL/hr over 60 Minutes Intravenous Every 24 hours 07/01/14 1858 07/05/14 0801   07/01/14 2200  piperacillin-tazobactam (ZOSYN) IVPB 3.375 g  Status:  Discontinued     3.375 g 100 mL/hr over 30 Minutes Intravenous 4 times per day 07/01/14 1858 07/05/14 0749   07/01/14 2000  fluconazole (DIFLUCAN) IVPB 400 mg  Status:  Discontinued     400 mg 100 mL/hr over 120 Minutes Intravenous Every 24 hours 07/01/14 1858 07/05/14 0757   07/01/14 1400  fluconazole (DIFLUCAN) IVPB 200 mg  Status:  Discontinued     200 mg 100 mL/hr over 60 Minutes Intravenous Every 24 hours 07/01/14 1330 07/01/14 1853   07/01/14 1000  piperacillin-tazobactam (ZOSYN) IVPB 2.25 g  Status:  Discontinued     2.25 g 100 mL/hr over 30 Minutes Intravenous Every 8 hours 07/01/14 0952 07/01/14 1853   07/01/14 1000  vancomycin (VANCOCIN) 500 mg in sodium chloride 0.9 % 100 mL IVPB     500 mg 100 mL/hr over 60 Minutes Intravenous  Once 07/01/14 0952 07/01/14 1126   06/30/14 1200  vancomycin (VANCOCIN) IVPB 1000 mg/200 mL premix     1,000 mg 200 mL/hr over 60 Minutes Intravenous  Once 06/30/14 0944 06/30/14 1403   06/30/14 1200  ceFEPIme (MAXIPIME) 2 g in dextrose 5 % 50 mL IVPB     2 g 100 mL/hr over 30 Minutes Intravenous  Once 06/30/14 0944 06/30/14 1425   06/30/14 0100  vancomycin (VANCOCIN) 2,000 mg in sodium chloride 0.9 % 500 mL IVPB     2,000 mg 250 mL/hr over 120 Minutes Intravenous  Once 06/30/14 0048 06/30/14 0525   06/30/14 0100  ceFEPIme (MAXIPIME) 2 g in dextrose 5 % 50 mL IVPB     2 g 100 mL/hr over 30 Minutes Intravenous  Once 06/30/14 0048 06/30/14 0322   06/27/14 0600  cefUROXime (ZINACEF) 1.5 g in dextrose 5 % 50 mL IVPB     1.5 g 100 mL/hr over 30 Minutes Intravenous On call to O.R. 06/26/14 1019 06/27/14 0630   06/19/14 1800  oseltamivir (TAMIFLU) capsule 30 mg     30 mg Oral Every M-W-F (1800) 06/19/14  1003 06/22/14 1841   06/18/14 1600  oseltamivir (TAMIFLU) capsule 30 mg  Status:  Discontinued     30 mg Oral Daily 06/18/14 1538 06/19/14 1003      Assessment/Plan: POD #51 s/p Exploratory Laparotomy with graham patch closure of perforated pyloric ulcer, SBR  for ischemic bowel - 07/01/2014 - [name]  Intra-abdominal (pelvic) fluid collection Now with duodenal fistula -CT guided aspiration CX negx3d -Trickle J port TF. Advance these 5/21 if continues to have no TF in JP ID-Cipro/Flagyl per ID. C diff colitis New ESRD PCM/TNA - wean TNA if she tolerates TF advancement over the weekend.    LOS: 65 days    Jermaine Neuharth E 08/21/2014

## 2014-08-21 NOTE — Progress Notes (Signed)
[name] ZOX:[id number] DOB: March 05, 1956 DOA: 06/17/2014 PCP: Willey BladeEAN, [name], MD   Brief Narrative: 59 yo ? smoker with hx HTN, COPD, CKD IV followed @ [facility]-Dr. [name], failed L AV fistula, initially admitted 3/16 with flu and acute hypoxic resp failure. Ultimately tx to [facility] due to need for HD. L sided weakness 3/18 and found to have R ACA stroke-showed acute infarct R ACA. Patient more lethargic 3/30 and CT abd=perf viscus 3/31. She was taken to the OR for ex lap for perforated pyloric ulcer with diffuse peritonitis, ischemic necrosis of mid ileum. Patient developed septic shock on 4-1, she was started on pressors and wean off pressors on 4-3. Subsequently she was diagnosed with pelvic abscess by CT scan perform on 4-7. She underwent pelvic drain placement by IR. C. diff diagnosed 4-12-initially unresponsive to flagyl, and she was started on vancomycin 4-16. Develops  run of SVT. Cardiology was helping with management. Course complicated with high RV pressure, CT angio negative for PE.  Also with duodenal fistula, making her back to NPO state and in need of TPN. Palliative care consulted. On 5/2 she developed hypoxemia, hypotension and SVT during dialysis treatment. Dialysis was stopped. She received IV bolus. She was started on BIPAP. She was transferred to Step down unit. PCCM and cardiology re consulted. Her Hr decreased, BP improved. She was taken off BIPAP. This recurred on 5/15 with episodic SVT.  Now stable, on TNA + very high output from fistula/leak per [facility]. Had a G_J tube placed by Dr. [name] of IR and Gen surgery has continued to assist with management of her multiple surgical comorbidities. CT repeat 5/14 showed 5 cm abscess-transgluteal drain with 5 ml of purulent fluid removed on 5/17.  CIR is interested in taking her if she can stabilize enough from her medical conditions  Subjective: Patient seen at dialysis. She complains of difficulty breathing and is noted to be  wheezing. Denies any chest pain. No nausea, vomiting.   Assessment/Plan:  Status post exploratory laparotomy with closure of perforated pyloric ulcer/abdominal abscess / small bowel resection w/ postop ileus and now with fistula. Pelvic abscess s/p percutaneous drain placement 4/8; had leakage from anastomosis site. Received Zosyn for 23 days. Chronic low grade leukocytosis range 13-15 -IV antibiotics were discontinued by Surgery on 4/22. Also received 18 days of Vancomycin and fluconazole.  -Due to Fistula is back to NPO state, TPN. Trickle feeds being attempted. -Fistula study done shows JP drain connection to the duodenal bulb-5-02. - repeat CT abdomen with  3.0 x 3.5 x 5.7 cm collection in the pelvic cul-de-sac and extraluminal air alongside JP drain in anterior abd wall -Drain with old and new blood, Hb trending down, transfuse 2 unit PRBC 5/10 -continues to have high output from Fistula/leak. Had G_J placed by IR 08/12/14 at request of gen surgery -Patient continues to be on octreotide, which is being managed by surgery.  -CT repeat shows 5 cm post cul-de-sac abscess which was drained on 5/16-culture pending  -ID was consulted as patient had Pseudomonas growing from her culture wounds and they started back Flagyl as well as Elita QuickFortaz on 5/16 and are following and we await final growth from her new abscess drained on 5/16. - Continue with tube feedings. The rate was increased to 20 mL per hour yesterday. Seems to be tolerating. TPN rate had to be reduced due to fluid overload. -Patient inadvertently pulled out her gluteal drain. Seen by interventional radiology and they recommend repeat CT scan, which  will be ordered.  PSVT/Multiple episoded 5/2 and 5/15 and 5/17  -Felt to be secondary to severe anxiety with dialysis   -Has been evaluated by EP.  -Cardiology following peripherally- follow K+ and Mg  -Continue IV metoprolol, now off amiodarone;  -V-Q scan with intermediate probability for  pulmonary embolism.  -CTA 4/27, negative for PE   -Heart rate better controlled with scheduled intravenous metoprolol. Monitor blood pressures closely as she does have a tendency to become hypotensive.   ESRD new, now on HD this admit complicated by intermittent hypotension AKi on CKD 4, due to sepsis, shock Ongoing hemodialysis per Nephrology - perm-cath placed 3/26 d/t AVF problems Patient AV fistula reviewed and not mature yet per Dr. Paulene Floor need to revisit prior to d/c has had some ongoing hypotension-EDW may need to be adjusted as per Nephrology  C. difficile colitis still on contact precautions -C diff positive on 4-12, Received 4 days of flagyl without improvement, then started on PO vanc -Completed 3 weeks of Oral Vanc-stopped 5/7, this was 1 week after IV Zosyn was stopped 5/5 -Diarrhea resolved -keep contact precautions on board   ACA CVA with Lt sided weakness -TEE ;normal LV function; no LAA thrombus -Started on ASA  per Neuro -Will need ongoing long term rehab - PT/OT to cont to follow while inpatient -Patient remains very weak and deconditioned. Will potentially need CIR when medically stable  Chronic diastolic congestive heart failure w/ Severe RHF EF 60-65%, grade 1 diastolic dysfunction. Fluids/volume managed with hemodialysis.  Elevated PA pressures on 2-D echo; CTA negative for PE  Small PFO Has been evaluated by Cardiology  Anemia of critical illness/chronic disease -and bleeding from leak/fistula -s/p 2U PRBC 4/13 and 5/10 Aranesp/iron per renal -hb stable  Thrombocytopenia; resolved.  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  -also has COPD and PAH -improving, now weaning off  O2 -Nebulizer PRN and CPAP QHS   Septic shock  -due to perforated prepyloric ulcer, required pressors -resolved currently  Diabetes mellitus 2 Hypoglycemia in setting of NPO status. On TPN now, CBGs stabilizedin 120-160s. Hba1c 6.5  Electrolyte imbalance  Being addressed via TPN  Abnormal TSH -Free T4 WNL, T3 1.8 -most likely sick thyroid with ongoing infection and body stress; also due to amiodarone use. TSH in 7 range -continue low dose synthroid given PSVT, now on IV synthroid   L Renal mass  -noted on [place], Indeterminate 1.9 cm hypoechoic mass off the interpolar region of the left kidney -may potentially represent a cyst however solid mass is not excluded -needs outpatient FU for this  Obesity - Inadequate oral intake related to altered GI function as evidenced by limited intake, ongoing.  Body mass index is 36.42 kg/(m^2). -patient weight trending down -patient unable to tolerate diet and with leaking fistula  Fall on May 18 Examination did not reveal any injuries. She does have reasonably good range of motion of the left upper extremity and left lower extremity all joints. No need for imaging studies.  DVT prophylaxis: SCDs Code Status: FULL Family Communication:  Discussed with patient. No family at bedside. Disposition Plan: Not ready for discharge. Ongoing management for abdominal abscesses and poor nutritional status.  Consultants: Cardiology Nephrology PCCM Gen Surgery  Palliative care  Significant Events:  3/16 influenza A+ 3/18 R ACA stroke 3/19 MRI multifocal acute infarction of both hemispheres, large right distal ACA 3/22 TEE severe RAE, severely reduced RV function, severe TR, no LAA thrombus, small PFO on TEE 3/31 to OR ex lap for per pyloric ulcer and sm bowel resection 4/1 septic shock on pressors 4/3 pressors weaned off  4/4 restart pressors, CRRT 4/6 levo switched to neo 4/7 pelvic abscess on CT 4/8 IR placed pelvic drain 4/9 off  pressors 4/11 long run of SVT. 4/23 VQ scan which demonstrated intermediate probability for PE 4/24 CT angiogram of her chest: Pending/unable to be done due to lack of access    Objective: Blood pressure 92/70, pulse 106, temperature 98 F (36.7 C), temperature source Oral, resp. rate 24, height  (1.676 m), weight 102.3 kg (225 lb 8.5 oz), SpO2 95 %.  Intake/Output Summary (Last 24 hours) at 08/21/14 0757 Last data filed at 08/21/14 0200  Gross per 24 hour  Intake 2284.34 ml  Output   3222 ml  Net -937.66 ml   Exam: General: AAOx3, tired coherent Lungs: Few end expiratory wheezing bilaterally. Crackles at the bases. Cardiovascular: Regular rate, no murmurs and no gallops, sinus tachycardia  Abdomen: Abdominal wound dressing noted-wounds not examined. Multiple drains also noted. G-tube also present.  Full range of motion left shoulder, left elbow, left wrist. Some swelling of the left knee is noted, but she has good range of motion. Also has good range of motion of the left hip.  Data Reviewed: Basic Metabolic Panel:  Recent Labs Lab 08/15/14 0430 08/16/14 0641 08/17/14 0430 08/18/14 0449 08/19/14 0551 08/19/14 0730 08/20/14 0500 08/21/14 0253  NA 131* 132* 131* 129* 123* 132* 130* 132*  K 3.0* 4.4 4.2 4.5 3.3* 3.4* 3.7 4.2  CL 98* 95* 95* 93* 90* 96* 96* 98*  CO2 GLUCOSE 128* 127* 68 125* 734* 119* 125* 123*  BUN 66* 45* 71* 98* 45* 50* 67* 34*  CREATININE 4.23* 3.59* 4.80* 5.84* 3.53* 3.81* 4.66* 2.98*  CALCIUM 7.4* 7.6* 7.9* 7.7* 7.3* 7.5* 7.6* 7.8*  MG 2.3 2.2 2.3  --   --   --  2.0 1.9  PHOS 3.8 4.0 6.2*  --   --   --  4.4 3.6    Liver Function Tests:  Recent Labs Lab 08/18/14 0449 08/19/14 0551 08/19/14 0730 08/20/14 0500 08/21/14 0253  AST ALT 8* 11* 12* 8* 9*  ALKPHOS 163* 140* 156* 139* 151*  BILITOT 1.2 1.0 1.1 0.9 0.8  PROT 6.2* 5.8* 6.2* 6.2* 6.4*  ALBUMIN 1.5* 1.4* 1.6* 1.5* 1.6*    CBC:  Recent Labs Lab 08/17/14 0430 08/18/14 0449 08/19/14 0730 08/20/14 0500 08/21/14 0253  WBC 9.8 8.4 11.7* 11.0* 12.4*  NEUTROABS 7.3  --  8.5*  --   --   HGB 11.3* 12.2 8.0* 7.9* 8.5*  HCT 36.3 38.1 26.1* 25.6* 27.6*  MCV 90.5 88.4 90.3 89.8 90.5  PLT 203 185 240 260 328    CBG:  Recent Labs Lab 08/20/14 0621 08/20/14 1312 08/20/14 1744 08/21/14 0021 08/21/14 0549  GLUCAP 123* 162* 125* 126* 138*    Scheduled Meds:  Scheduled Meds: . antiseptic oral rinse  7 mL Mouth Rinse q12n4p  . budesonide (PULMICORT) nebulizer solution  0.25 mg Nebulization BID  . cefTAZidime (FORTAZ)  IV  2 g Intravenous Q T,Th,Sa-HD  . chlorhexidine  15 mL Mouth Rinse BID  .  darbepoetin (ARANESP) injection - DIALYSIS  100 mcg Intravenous Q Thu-HD  . feeding supplement (VITAL AF 1.2 CAL)  1,000 mL Per Tube Q24H  . ferric gluconate (FERRLECIT/NULECIT) IV  125 mg Intravenous Q T,Th,Sa-HD  . insulin aspart  0-9 Units Subcutaneous 4 times per day  . levothyroxine  12.5 mcg Intravenous Daily  . metoprolol  2.5 mg Intravenous 4 times per day  . metronidazole  500 mg Intravenous 3 times per day  . sodium chloride  10-40 mL Intracatheter Q12H    Time spent on care of this patient: 25minutes   Kharee Lesesne [facility] ([facility]) [phone]   If 7PM-7AM, please contact night-coverage [email or web address] Password [email or web address] 08/21/2014, 7:57 AM   LOS: 65 days

## 2014-08-21 NOTE — Progress Notes (Signed)
INFECTIOUS DISEASE PROGRESS NOTE  ID: [name] is a 59 y.o. female with  Principal Problem:   Acute respiratory failure with hypoxia Active Problems:   Cerebral thrombosis with cerebral infarction   Hypertension   ESRD needing dialysis   Obesity (BMI 30-39.9)   Depression   Left renal mass   Chronic diastolic heart failure   H1N1 influenza   Tobacco use disorder   Renal failure (ARF), acute on chronic   Hyperlipidemia   PSVT (paroxysmal supraventricular tachycardia)   SOB (shortness of breath)   Perforated gastric ulcer   Abnormal TSH   History of intermediate V/Q scan   HIT (heparin-induced thrombocytopenia)   Fistula   Abdominal pain   Palliative care encounter   ESRD (end stage renal disease)   SVT (supraventricular tachycardia)   Right heart failure   Duodenal anastomotic leak   Protein calorie malnutrition   Itching   Aortic aneurysm   Protein-calorie malnutrition  Subjective: Without complaints  Abtx:  Anti-infectives    Start     Dose/Rate Route Frequency Ordered Stop   08/21/14 1430  cefTAZidime (FORTAZ) 1 g in dextrose 5 % 50 mL IVPB     1 g 100 mL/hr over 30 Minutes Intravenous  Once 08/21/14 1426 08/21/14 1547   08/18/14 1300  metroNIDAZOLE (FLAGYL) IVPB 500 mg     500 mg 100 mL/hr over 60 Minutes Intravenous 3 times per day 08/18/14 1045     08/18/14 1200  cefTAZidime (FORTAZ) 2 g in dextrose 5 % 50 mL IVPB     2 g 100 mL/hr over 30 Minutes Intravenous Every T-Th-Sa (Hemodialysis) 08/18/14 1054     08/12/14 1449  ceFAZolin (ANCEF) 2-3 GM-% IVPB SOLR    Comments:  Troy Sine   : cabinet override      08/12/14 1449 08/12/14 1629   08/12/14 1130  ceFAZolin (ANCEF) IVPB 2 g/50 mL premix    Comments:  Hang ON CALL to xray 5/11   2 g 100 mL/hr over 30 Minutes Intravenous To Radiology 08/12/14 1035 08/12/14 1520   08/04/14 1200  vancomycin (VANCOCIN) 50 mg/mL oral solution 125 mg  Status:  Discontinued     125 mg Oral 4 times per day  08/04/14 0851 08/08/14 1229   07/18/14 1800  vancomycin (VANCOCIN) 50 mg/mL oral solution 125 mg  Status:  Discontinued     125 mg Oral 4 times per day 07/18/14 1455 08/04/14 0845   07/17/14 1830  vancomycin (VANCOCIN) IVPB 750 mg/150 ml premix     750 mg 150 mL/hr over 60 Minutes Intravenous  Once 07/17/14 1818 07/18/14 0024   07/15/14 1400  metroNIDAZOLE (FLAGYL) tablet 500 mg  Status:  Discontinued     500 mg Oral 3 times per day 07/15/14 1202 07/18/14 1533   07/13/14 2200  piperacillin-tazobactam (ZOSYN) IVPB 2.25 g  Status:  Discontinued     2.25 g 100 mL/hr over 30 Minutes Intravenous 3 times per day 07/13/14 1318 07/24/14 1138   07/13/14 2000  fluconazole (DIFLUCAN) IVPB 200 mg  Status:  Discontinued     200 mg 100 mL/hr over 60 Minutes Intravenous Every 24 hours 07/13/14 1318 07/19/14 1027   07/07/14 1200  vancomycin (VANCOCIN) IVPB 1000 mg/200 mL premix  Status:  Discontinued     1,000 mg 200 mL/hr over 60 Minutes Intravenous Every 24 hours 07/07/14 0937 07/14/14 1712   07/06/14 2000  fluconazole (DIFLUCAN) IVPB 400 mg  Status:  Discontinued  400 mg 100 mL/hr over 120 Minutes Intravenous Every 24 hours 07/06/14 1507 07/13/14 1318   07/06/14 1800  piperacillin-tazobactam (ZOSYN) IVPB 2.25 g  Status:  Discontinued     2.25 g 100 mL/hr over 30 Minutes Intravenous 4 times per day 07/06/14 1505 07/13/14 1318   07/05/14 2000  fluconazole (DIFLUCAN) IVPB 200 mg  Status:  Discontinued     200 mg 100 mL/hr over 60 Minutes Intravenous Every 24 hours 07/05/14 0757 07/06/14 1507   07/05/14 1400  piperacillin-tazobactam (ZOSYN) IVPB 2.25 g  Status:  Discontinued     2.25 g 100 mL/hr over 30 Minutes Intravenous 3 times per day 07/05/14 0749 07/06/14 1505   07/02/14 1800  vancomycin (VANCOCIN) IVPB 1000 mg/200 mL premix  Status:  Discontinued     1,000 mg 200 mL/hr over 60 Minutes Intravenous Every 24 hours 07/01/14 1858 07/05/14 0801   07/01/14 2200  piperacillin-tazobactam (ZOSYN) IVPB  3.375 g  Status:  Discontinued     3.375 g 100 mL/hr over 30 Minutes Intravenous 4 times per day 07/01/14 1858 07/05/14 0749   07/01/14 2000  fluconazole (DIFLUCAN) IVPB 400 mg  Status:  Discontinued     400 mg 100 mL/hr over 120 Minutes Intravenous Every 24 hours 07/01/14 1858 07/05/14 0757   07/01/14 1400  fluconazole (DIFLUCAN) IVPB 200 mg  Status:  Discontinued     200 mg 100 mL/hr over 60 Minutes Intravenous Every 24 hours 07/01/14 1330 07/01/14 1853   07/01/14 1000  piperacillin-tazobactam (ZOSYN) IVPB 2.25 g  Status:  Discontinued     2.25 g 100 mL/hr over 30 Minutes Intravenous Every 8 hours 07/01/14 0952 07/01/14 1853   07/01/14 1000  vancomycin (VANCOCIN) 500 mg in sodium chloride 0.9 % 100 mL IVPB     500 mg 100 mL/hr over 60 Minutes Intravenous  Once 07/01/14 0952 07/01/14 1126   06/30/14 1200  vancomycin (VANCOCIN) IVPB 1000 mg/200 mL premix     1,000 mg 200 mL/hr over 60 Minutes Intravenous  Once 06/30/14 0944 06/30/14 1403   06/30/14 1200  ceFEPIme (MAXIPIME) 2 g in dextrose 5 % 50 mL IVPB     2 g 100 mL/hr over 30 Minutes Intravenous  Once 06/30/14 0944 06/30/14 1425   06/30/14 0100  vancomycin (VANCOCIN) 2,000 mg in sodium chloride 0.9 % 500 mL IVPB     2,000 mg 250 mL/hr over 120 Minutes Intravenous  Once 06/30/14 0048 06/30/14 0525   06/30/14 0100  ceFEPIme (MAXIPIME) 2 g in dextrose 5 % 50 mL IVPB     2 g 100 mL/hr over 30 Minutes Intravenous  Once 06/30/14 0048 06/30/14 0322   06/27/14 0600  cefUROXime (ZINACEF) 1.5 g in dextrose 5 % 50 mL IVPB     1.5 g 100 mL/hr over 30 Minutes Intravenous On call to O.R. 06/26/14 1019 06/27/14 0630   06/19/14 1800  oseltamivir (TAMIFLU) capsule 30 mg     30 mg Oral Every M-W-F (1800) 06/19/14 1003 06/22/14 1841   06/18/14 1600  oseltamivir (TAMIFLU) capsule 30 mg  Status:  Discontinued     30 mg Oral Daily 06/18/14 1538 06/19/14 1003      Medications:  Scheduled: . antiseptic oral rinse  7 mL Mouth Rinse q12n4p  .  budesonide (PULMICORT) nebulizer solution  0.25 mg Nebulization BID  . cefTAZidime (FORTAZ)  IV  2 g Intravenous Q T,Th,Sa-HD  . chlorhexidine  15 mL Mouth Rinse BID  . darbepoetin (ARANESP) injection - DIALYSIS  100 mcg  Intravenous Q Thu-HD  . feeding supplement (VITAL AF 1.2 CAL)  1,000 mL Per Tube Q24H  . ferric gluconate (FERRLECIT/NULECIT) IV  125 mg Intravenous Q T,Th,Sa-HD  . HYDROmorphone      . insulin aspart  0-9 Units Subcutaneous 4 times per day  . levothyroxine  12.5 mcg Intravenous Daily  . metoprolol  2.5 mg Intravenous 4 times per day  . metronidazole  500 mg Intravenous 3 times per day  . sodium chloride  10-40 mL Intracatheter Q12H    Objective: Vital signs in last 24 hours: Temp:  [97.8 F (36.6 C)-98.6 F (37 C)] 98.4 F (36.9 C) (05/20 1610) Pulse Rate:  [102-135] 111 (05/20 1610) Resp:  [15-28] 20 (05/20 1610) BP: (91-132)/(52-85) 91/54 mmHg (05/20 1610) SpO2:  [89 %-100 %] 95 % (05/20 1610) Weight:  [102.3 kg (225 lb 8.5 oz)] 102.3 kg (225 lb 8.5 oz) (05/20 0745)   General appearance: alert, cooperative, fatigued and no distress Resp: clear to auscultation bilaterally Cardio: tachycardia GI: normal findings: bowel sounds normal and soft, non-tender and wound dressed.  Extremities: edema none, LE  Lab Results  Recent Labs  08/21/14 0253 08/21/14 0824 08/21/14 0825  WBC 12.4* 13.4*  --   HGB 8.5* 8.1*  --   HCT 27.6* 26.4*  --   NA 132*  --  132*  K 4.2  --  4.1  CL 98*  --  96*  CO2 25  --  27  BUN 34*  --  39*  CREATININE 2.98*  --  3.20*   Liver Panel  Recent Labs  08/20/14 0500 08/21/14 0253 08/21/14 0825  PROT 6.2* 6.4*  --   ALBUMIN 1.5* 1.6* 1.6*  AST 20 25  --   ALT 8* 9*  --   ALKPHOS 139* 151*  --   BILITOT 0.9 0.8  --    Sedimentation Rate No results for input(s): ESRSEDRATE in the last 72 hours. C-Reactive Protein No results for input(s): CRP in the last 72 hours.  Microbiology: Recent Results (from the past 240  hour(s))  Wound culture     Status: None   Collection Time: 08/15/14  6:27 AM  Result Value Ref Range Status   Specimen Description WOUND  Final   Special Requests ABDOMIN  Final   Gram Stain   Final    MODERATE WBC PRESENT, PREDOMINANTLY PMN NO SQUAMOUS EPITHELIAL CELLS SEEN FEW GRAM NEGATIVE RODS Performed at [facility]    Culture   Final    ABUNDANT PSEUDOMONAS AERUGINOSA Performed at [facility]    Report Status 08/18/2014 FINAL  Final   Organism ID, Bacteria PSEUDOMONAS AERUGINOSA  Final      Susceptibility   Pseudomonas aeruginosa - MIC*    CEFEPIME 16 INTERMEDIATE Intermediate     CEFTAZIDIME 4 SENSITIVE Sensitive     CIPROFLOXACIN <=0.25 SENSITIVE Sensitive     GENTAMICIN 2 SENSITIVE Sensitive     IMIPENEM 1 SENSITIVE Sensitive     PIP/TAZO 16 SENSITIVE Sensitive     TOBRAMYCIN <=1 SENSITIVE Sensitive     * ABUNDANT PSEUDOMONAS AERUGINOSA  Culture, routine-abscess     Status: None   Collection Time: 08/17/14  2:51 PM  Result Value Ref Range Status   Specimen Description ABSCESS PELVIS  Final   Special Requests NONE  Final   Gram Stain   Final    FEW WBC PRESENT,BOTH PMN AND MONONUCLEAR NO SQUAMOUS EPITHELIAL CELLS SEEN NO ORGANISMS SEEN Performed at [facility]  Culture   Final    NO GROWTH 3 DAYS Performed at [facility]    Report Status 08/21/2014 FINAL  Final    Studies/Results: No results found.   Assessment/Plan: Wound infection Abscess 5-14 pseudomonas Perforated pyloric ulcer Ischemic necrosis ileum COPD CKD, ESRD on HD HTN Protein calorie malnutrition, severe C diff (4-12)  +HIT  Total days of antibiotics: day 4 ceftaz/flagyl  No change in anbx Her most recent aspirate from her abd wound is (-).  Consider repeat CT next week? ID available if questions over weekend.          Stephanie SaxJeffrey Kathline [name] Infectious Diseases (pager) [phone] [email or web address] 08/21/2014, 4:48 PM  LOS:  65 days

## 2014-08-21 NOTE — Progress Notes (Signed)
ANTIBIOTIC CONSULT NOTE - FOLLOW UP  [facility] Indication: Pseudomonal abdominal wound  Allergies  Allergen Reactions  . 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  Patient Measurements: Height: 5\' 6"  (167.6 cm) Weight: 225 lb 8.5 oz (102.3 kg) IBW/kg (Calculated) : 59.3 Vital Signs: Temp: 98 F (36.7 C) (05/20 1236) Temp Source: Oral (05/20 1236) BP: 95/52 mmHg (05/20 1236) Pulse Rate: 111 (05/20 1236) Intake/Output from previous day: 05/19 0701 - 05/20 0700 In: 2284.3 [I.V.:375; NG/GT:239.3; IV Piggyback:100; TPN:1570] Out: 3222 [Drains:720] Intake/Output from this shift: Total I/O In: 265 [I.V.:75; NG/GT:60; IV Piggyback:100; TPN:30] Out: 2100 [Other:2100] Labs:  Recent Labs  08/20/14 0500 08/21/14 0253 08/21/14 0824 08/21/14 0825  WBC 11.0* 12.4* 13.4*  --   HGB 7.9* 8.5* 8.1*  --   PLT 260 328 276  --   CREATININE 4.66* 2.98*  --  3.20*   Estimated Creatinine Clearance: 23.1 mL/min (by C-G formula based on Cr of 3.2).  Assessment: 58 YOF on Pseudomonas abdominal infection on day #4 Fortaz. Patient with ESRD on HD Tuesday, Thursday, Saturday. Patient had extra HD session today - BFR 400 for 3 hours.  WBC elevated to 13.4, afebrile.   Goal of Therapy:  Clinical resolution of infection  Plan:  Give extra 1gm dose of Fortaz today due to extra HD session this AM.  Then continue Fortaz 2g post HD Tuesday-Thursday-Saturday.   Link SnufferJessica Maiyah Goyne, PharmD, BCPS Clinical Pharmacist [phone] 08/21/2014,2:34 PM

## 2014-08-21 NOTE — Progress Notes (Signed)
PT Cancellation Note  Patient Details Name: [name] MRN: [id number] DOB: 12/15/1955   Cancelled Treatment:    Reason Eval/Treat Not Completed: Patient at procedure or test/unavailable (pt at HD. Will follow. )   Tamala SerUhlenberg, Crissie Aloi Kistler 08/21/2014, 10:21 AM [phone]

## 2014-08-21 NOTE — Progress Notes (Signed)
Per PA patient HD tx time extended to 3 hour, 4k bath and goal is 2.5L

## 2014-08-21 NOTE — Progress Notes (Addendum)
[facility] for TPN Indication:  High output Duodenal Fistula  Allergies  Allergen Reactions  . 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    Patient Measurements: Height: 5\' 6"  (167.6 cm) Weight: 225 lb 8.5 oz (102.3 kg) IBW/kg (Calculated) : 59.3 Adjusted Body Weight: 71.2kg  Vital Signs: Temp: 98 F (36.7 C) (05/20 0550) Temp Source: Oral (05/20 0550) BP: 92/70 mmHg (05/20 0600) Pulse Rate: 106 (05/20 0600) Intake/Output from previous day: 05/19 0701 - 05/20 0700 In: 2284.3 [I.V.:375; NG/GT:239.3; IV Piggyback:100; TPN:1570] Out: 3222 [Drains:720]  Labs:  Recent Labs  08/19/14 0730 08/20/14 0500 08/21/14 0253  WBC 11.7* 11.0* 12.4*  HGB 8.0* 7.9* 8.5*  HCT 26.1* 25.6* 27.6*  PLT 240 260 328    Recent Labs  08/19/14 0730 08/20/14 0500 08/21/14 0253  NA 132* 130* 132*  K 3.4* 3.7 4.2  CL 96* 96* 98*  CO2 28 26 25   GLUCOSE 119* 125* 123*  BUN 50* 67* 34*  CREATININE 3.81* 4.66* 2.98*  CALCIUM 7.5* 7.6* 7.8*  MG  --  2.0 1.9  PHOS  --  4.4 3.6  PROT 6.2* 6.2* 6.4*  ALBUMIN 1.6* 1.5* 1.6*  AST 31 20 25   ALT 12* 8* 9*  ALKPHOS 156* 139* 151*  BILITOT 1.1 0.9 0.8   Estimated Creatinine Clearance: 24.9 mL/min (by C-G formula based on Cr of 2.98).   Recent Labs  08/20/14 1744 08/21/14 0021 08/21/14 0549  GLUCAP 125* 126* 138*   Insulin Requirements in the past 24 hours:  2 unit Novolog SSI since 1800 last night  Current Nutrition:  Clinimix E5/15 at 23ml/hr and 20% IVFE at 62ml/hr  Vital AF 1.2 at 35ml/hr  Nutritional Goals: per RD assessment 5/12 2100-2300 kCal, 120-140 grams of protein per day Goal: Clinimix 5/15 at 168ml/hr + 20% IVFE 78ml/hr to provide 2184 kcal and 120 gm protein  Assessment: 58 YOF with complicated hospital course. S/p closure perforated pyloric ulcer, SBR for ischemic necrosis, diffuse peritonitis on 07/01/14. Pharmacy consulted to provide TPN 4/1-4/15 for nutrition support for prolonged ileus. Pt  then transitioned to po diet + supplements. 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  ZS:WFUXNATF fistula. NPO.Open abd wound, (+)abd pain. Octreotide drip ordered 5/10 am. Albumin 1.7. Prealbumin remains low at 7.9. Tube feeds started now running to 39ml/hr.  5/10 Add octreotide drip 5/11 IR placed gastro-jenjunal tube for post-pyloric feeding and drainage of gastric juices 5/12 to start Vital AF 1.2 trickle feedings via J tube at 10 ml/hr 5/13 Continues on trickle feeds at 10 ml/hr 5/14: Possible leak in GJ tube - TFs held. Imaging showed no change in JP position with tips in proper positions however pt still with pre-rectal fluid collection 5/15: TFs still being held. IR to replace drain for fluid collection 5/16: IR percutaneous placement of drain in pelvis with aspiration/drainage   Endo:Hx DM/hypothyroidism - CBGs well controlled on SSI alone, synthroid (new), TSH 7.3 Lytes: Lytes being added and removed to the TPN bag as needed based on labs. Na 132, K 4.2, Mg 1.9, Phos 3.6, CoCa 9.72 (Ca x Phos = 35) Per discussion with renal-pharmacy to manage K  Renal: New ESRD - started 3/17. HD on TTS. NS kvo, planning HD today Heme: Anemia of ESRD - H/H 8.5/27.6 plts 328 - ferric gluconate with HD Pulm: Hx asthma/COPD. 100% 1L [employer]. CT negative for PE Cards: HTN, afib - IV metoprolol scheduled, CHADS-VASc =4 (not  good candidate for [facility]). 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  Hepatotobil: Alk phos elevated, other LFTs + Tbili WNL, TG 142 (5/16) Neuro: Hx depression / insomnia - new stroke noted on 3/18.  ID: Afebrile, WBC mildly elevated at 11, s/p treatment for CDiff and s/p multiple courses of abx during admission. Hep B Ag negative. Flagyl resumed empirically on 5/17 when ceftaz was resumed. 5/14 wound cx (abdomen): pseudomonas   5/17 ceftazidime >> 5/17 flagyl >> Best Practices: SCDs, PPI IV TPN Access: PICC line in IR  4/27 TPN start date: 4/1 >> 4/15; restart 4/27>>  Plan:  - Decrease Clinimix E5/15 (continuing with electrolytes today as pt will receive HD so levels may go down) to 13ml/hr per MD request to limit fluids, continue IVFE at 22ml/hr  - Continue MVI + TE in TPN - Continue sensitive SSI - Continue ascorbic acid 1gm in TPN - MD wants a total of 14 days (started in TPN 5/18) - will need to reorder when TPN is discontinued - F/u tolerance of tube feeds and ability to titrate up and discontinue TPN  Thanks for allowing pharmacy to be a part of this patient's care.  Excell Seltzer, PharmD Clinical Pharmacist, [phone]   08/21/2014 7:56 AM

## 2014-08-22 DIAGNOSIS — D631 Anemia in chronic kidney disease: Secondary | ICD-10-CM

## 2014-08-22 LAB — GLUCOSE, CAPILLARY
GLUCOSE-CAPILLARY: 156 mg/dL — AB (ref 65–99)
GLUCOSE-CAPILLARY: 111 mg/dL — AB (ref 65–99)
Glucose-Capillary: 136 mg/dL — ABNORMAL HIGH (ref 65–99)
Glucose-Capillary: 148 mg/dL — ABNORMAL HIGH (ref 65–99)
Glucose-Capillary: 151 mg/dL — ABNORMAL HIGH (ref 65–99)

## 2014-08-22 LAB — CBC
HCT: 25.5 % — ABNORMAL LOW (ref 36.0–46.0)
Hemoglobin: 7.9 g/dL — ABNORMAL LOW (ref 12.0–15.0)
MCH: 28.9 pg (ref 26.0–34.0)
MCHC: 31 g/dL (ref 30.0–36.0)
MCV: 93.4 fL (ref 78.0–100.0)
PLATELETS: 281 10*3/uL (ref 150–400)
RBC: 2.73 MIL/uL — ABNORMAL LOW (ref 3.87–5.11)
RDW: 20.8 % — AB (ref 11.5–15.5)
WBC: 14.4 10*3/uL — AB (ref 4.0–10.5)

## 2014-08-22 LAB — MAGNESIUM: Magnesium: 2 mg/dL (ref 1.7–2.4)

## 2014-08-22 LAB — PHOSPHORUS: Phosphorus: 3.3 mg/dL (ref 2.5–4.6)

## 2014-08-22 MED ORDER — ALBUMIN HUMAN 25 % IV SOLN
INTRAVENOUS | Status: AC
  Administered 2014-08-22: 25 g
  Filled 2014-08-22: qty 50

## 2014-08-22 MED ORDER — TRACE MINERALS CR-CU-F-FE-I-MN-MO-SE-ZN IV SOLN
INTRAVENOUS | Status: AC
  Administered 2014-08-22: 17:00:00 via INTRAVENOUS
  Filled 2014-08-22: qty 720

## 2014-08-22 MED ORDER — FAT EMULSION 20 % IV EMUL
240.0000 mL | INTRAVENOUS | Status: AC
  Administered 2014-08-22: 240 mL via INTRAVENOUS
  Filled 2014-08-22: qty 250

## 2014-08-22 MED ORDER — ALBUTEROL SULFATE (2.5 MG/3ML) 0.083% IN NEBU
INHALATION_SOLUTION | RESPIRATORY_TRACT | Status: AC
  Filled 2014-08-22: qty 3

## 2014-08-22 NOTE — Progress Notes (Signed)
Back from dialysis. C/o not having a bath today and her room was not cleaned.  Bath offered and patient refused saying who takes a bath at 1 pm in the daytime. Extra linen in room removed. Number for AD and Director given.

## 2014-08-22 NOTE — Progress Notes (Signed)
[facility] Progress Note  Assessment/Plan: 1. New ESRD TTS for now, EXTRA treatment Friday with a net UF of 2.1 L - NO post weight;- significant weight variability and nonstanding ability make accurate UF goals difficult- needs serial treatments to get volume down, plan next HD  Monday. 2. SOB - wheezing, last cxr neg; has had inadequate UF with HD judging by flow sheet summaries- getting min volume off on several days; needs 5 day a week HD/UF until she gets volume down.-  3. Pelvic fluid s/p drain 5/16 - repeat CT 5/21 shows improved pelvic fluid collection 4. CVA acute b y MRI 3/19 5. Perf pyloric ulcer SP exlap, w duod fistula 6. Nutrition w G-J tube starting TF's (VItal)+trying to wean TNA- alb low - per pharm- volume of TF, TPN especially and IVF 2300 in yesterday - makes it difficult to get volume down. Difficult situation giving adequate kcal/pro 7. Volume wt's creeping up, CXR clear 5/17- most edema is central/dependent 8. C diff - s/p po Vanc - no diarrhea at present 9. Anemia Aranesp 100 q Thurs last 5/19IV Fe load-- Were Hgb's on 5.16 and 5/17 false high at 11.3 and 12.2?? - generally Hgb's in the 8s down to 7.9 today - if Hgb does not trend up by next Thursday, need to ^ Aranesp 10. HD access needs revision of AVF when current issues resolved 11. MBD - iPTH 130 5/12, low P resolved 12. Parox SVT on IV MTP q 4hrs 13. 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 Pseudomonas wound culture 5/16 - on Fortaz/flagyl 15. Leukocytosis - WBC increasing but no fevers - watch  [name], PA-C [facility] Beeper [phone] 08/22/2014,8:39 AM  LOS: 66 days   Pt seen, examined and agree w A/P as above. Not sure how long the octreotide infusion will be needed, but this is a significant volume load (1.2L per day) and if it can be reduced, stopped, transitioned to SQ, etc...at some point this would help with volume status.  Stephanie Moselle MD pager [phone]    cell  [phone] 08/22/2014, 9:48 AM    Subjective:   No new issues  Objective Filed Vitals:   08/22/14 0721 08/22/14 0730 08/22/14 0735 08/22/14 0800  BP: 101/70 101/85 107/74 103/80  Pulse: 104 104 109 106  Temp: 98.3 F (36.8 C)     TempSrc: Oral     Resp: 19     Height:      Weight: 96.8 kg (213 lb 6.5 oz)     SpO2: 100%      Physical Exam General: sleeping  Heart:tachy reg Lungs:exp wheezes Abdomen: obese, anasarca with dependent back/buttock edema Extremities: no sig edema Dialysis Access: right IJ Qb 400  Dialysis Orders:  Additional Objective Labs: Basic Metabolic Panel:  Recent Labs Lab 08/21/14 0253 08/21/14 0825 08/21/14 1526 08/22/14 0530  NA 132* 132* 136  --   K 4.2 4.1 4.1  --   CL 98* 96* 101  --   CO2 --   GLUCOSE 123* 138* 107*  --   BUN 34* 39* 11  --   CREATININE 2.98* 3.20* 1.71*  --   CALCIUM 7.8* 7.8* 7.7*  --   PHOS 3.6 3.9 2.1* 3.3   Liver Function Tests:  Recent Labs Lab 08/19/14 0730 08/20/14 0500 08/21/14 0253 08/21/14 0825 08/21/14 1526  AST --   --   ALT 12* 8* 9*  --   --  ALKPHOS 156* 139* 151*  --   --   BILITOT 1.1 0.9 0.8  --   --   PROT 6.2* 6.2* 6.4*  --   --   ALBUMIN 1.6* 1.5* 1.6* 1.6* 2.0*  CBC:  Recent Labs Lab 08/17/14 0430  08/19/14 0730 08/20/14 0500 08/21/14 0253 08/21/14 0824 08/21/14 1527 08/22/14 0530  WBC 9.8  < > 11.7* 11.0* 12.4* 13.4* 12.1* 14.4*  NEUTROABS 7.3  --  8.5*  --   --   --   --   --   HGB 11.3*  < > 8.0* 7.9* 8.5* 8.1* 7.9* 7.9*  HCT 36.3  < > 26.1* 25.6* 27.6* 26.4* 25.2* 25.5*  MCV 90.5  < > 90.3 89.8 90.5 92.0 92.0 93.4  PLT 203  < > 240 260 328 276 262 281  < > = values in this interval not displayed. Blood Culture    Component Value Date/Time   SDES ABSCESS PELVIS 08/17/2014 1451   SPECREQUEST NONE 08/17/2014 1451   CULT  08/17/2014 1451    NO GROWTH 3 DAYS Performed at [facility]    REPTSTATUS 08/21/2014 FINAL 08/17/2014 1451     Cardiac Enzymes:  Recent Labs Lab 08/15/14 1801 08/15/14 2150 08/16/14 0641  TROPONINI 0.10* 0.14* 0.10*   CBG:  Recent Labs Lab 08/21/14 0549 08/21/14 1230 08/21/14 1808 08/22/14 0004 08/22/14 0556  GLUCAP 138* 100* 126* 151* 148*  Studies/Results: Ct Abdomen Pelvis Wo Contrast  08/22/2014   CLINICAL DATA:  Abdominal abscess. No intravenous contrast due to renal failure.  EXAM: CT ABDOMEN AND PELVIS WITHOUT CONTRAST  TECHNIQUE: Multidetector CT imaging of the abdomen and pelvis was performed following the standard protocol without IV contrast.  COMPARISON:  08/15/2014  FINDINGS: BODY WALL: Open laparotomy without fluid collection.  LOWER CHEST: Chronic triangular opacity in the posterior costophrenic sulcus on the left, atelectasis versus scarring  ABDOMEN/PELVIS:  Liver: No focal abnormality.  Biliary: High-density material within the gallbladder showing mild wall thickening which is chronic.  Pancreas: Unremarkable.  Spleen: Unremarkable.  Adrenals: Unremarkable.  Kidneys and ureters: No hydronephrosis or stone. Presumed 1 cm cyst from the interpolar left kidney  Bladder: Decompressed.  No pathologic findings.  Reproductive: Calcified and noncalcified uterine fibroids, better seen on previous enhanced imaging.  Bowel: Right upper quadrant drain related to perforated pyloric ulcer status post Cheree DittoGraham patch. Specially visible on coronal imaging, there is a gas channel through the pylorus measuring 6 to 9 mm in diameter. No bowel obstruction. Enteroenterostomy noted in the pelvis. Oral contrast again seen in the colon. No notable colonic wall thickening in this patient with history of C difficile. Negative appendix. Percutaneous gastrojejunostomy tube is in good position.  Retroperitoneum: No mass or adenopathy.  Peritoneum: Known thick walled collection in the rectovaginal recess smaller than 08/15/2014 at 40 x 16 mm as compared to 51 x 32 mm. No new collection is identified. No free  pneumoperitoneum.  Vascular: 3 cm fusiform infrarenal aortic aneurysm.  OSSEOUS: No acute abnormalities.  IMPRESSION: 1. Near the patient's pyloric Cheree DittoGraham patch is a transmural defect in the stomach wall, contiguous with the surgical drain. If repeat surgery is considered, oral contrast could confirm an open ulcer. 2. Decreased rectovaginal recess fluid collection, now 4 x 1.5 cm (previously 5 x 3 cm).   Electronically Signed   By: Marnee SpringJonathon  Watts M.D.   On: 08/22/2014 01:49   Medications: . Marland Kitchen.TPN (CLINIMIX-E) Adult 60 mL/hr at 08/21/14 1745   And  . fat  emulsion 240 mL (08/21/14 1745)  . sodium chloride 10 mL/hr at 08/19/14 0950  . octreotide  (SANDOSTATIN)    IV infusion 50 mcg/hr (08/21/14 1512)   . albumin human      . antiseptic oral rinse  7 mL Mouth Rinse q12n4p  . budesonide (PULMICORT) nebulizer solution  0.25 mg Nebulization BID  . cefTAZidime (FORTAZ)  IV  2 g Intravenous Q T,Th,Sa-HD  . chlorhexidine  15 mL Mouth Rinse BID  . darbepoetin (ARANESP) injection - DIALYSIS  100 mcg Intravenous Q Thu-HD  . feeding supplement (VITAL AF 1.2 CAL)  1,000 mL Per Tube Q24H  . ferric gluconate (FERRLECIT/NULECIT) IV  125 mg Intravenous Q T,Th,Sa-HD  . insulin aspart  0-9 Units Subcutaneous 4 times per day  . levothyroxine  12.5 mcg Intravenous Daily  . metoprolol  2.5 mg Intravenous 4 times per day  . metronidazole  500 mg Intravenous 3 times per day  . sodium chloride  10-40 mL Intracatheter Q12H

## 2014-08-22 NOTE — Progress Notes (Signed)
Per Eber Jonesarolyn RN for [id number] advised to leave off feeding due to unable to replenish bag at this time,

## 2014-08-22 NOTE — Progress Notes (Addendum)
[name] OZH:[id number] DOB: 1955-07-12 DOA: 06/17/2014 PCP: Stephanie BladeEAN, [name], MD   Brief Narrative: 59 yo ? smoker with hx HTN, COPD, CKD IV followed @ [facility]-Dr. Lyn HollingsheadAlexander [name], failed L AV fistula, initially admitted 3/16 with flu and acute hypoxic resp failure. Ultimately tx to [facility] due to need for HD. L sided weakness 3/18 and found to have R ACA stroke-showed acute infarct R ACA. Patient more lethargic 3/30 and CT abd=perf viscus 3/31. She was taken to the OR for ex lap for perforated pyloric ulcer with diffuse peritonitis, ischemic necrosis of mid ileum. Patient developed septic shock on 4-1, she was started on pressors and wean off pressors on 4-3. Subsequently she was diagnosed with pelvic abscess by CT scan perform on 4-7. She underwent pelvic drain placement by IR. C. diff diagnosed 4-12-initially unresponsive to flagyl, and she was started on vancomycin 4-16. Develops  run of SVT. Cardiology was helping with management. Course complicated with high RV pressure, CT angio negative for PE.  Also with duodenal fistula, making her back to NPO state and in need of TPN. Palliative care consulted. On 5/2 she developed hypoxemia, hypotension and SVT during dialysis treatment. Dialysis was stopped. She received IV bolus. She was started on BIPAP. She was transferred to Step down unit. PCCM and cardiology re consulted. Her Hr decreased, BP improved. She was taken off BIPAP. This recurred on 5/15 with episodic SVT.  Now stable, on TNA + very high output from fistula/leak per [facility]. Had a G_J tube placed by Dr. [name] of IR and Gen surgery has continued to assist with management of her multiple surgical comorbidities. CT repeat 5/14 showed 5 cm abscess-transgluteal drain with 5 ml of purulent fluid removed on 5/17.  Gluteal drain was inadvertently pulled out on May 19. Repeat CT scan shows improvement in the fluid collection.  CIR is interested in taking her if she can stabilize enough from her medical  conditions  Subjective: Patient states that overall she feels better. Not as much pain. Continues to have some difficulty breathing at times.   Assessment/Plan:  Status post exploratory laparotomy with closure of perforated pyloric ulcer/abdominal abscess / small bowel resection w/ postop ileus and now with fistula. Pelvic abscess s/p percutaneous drain placement 4/8; had leakage from anastomosis site. Received Zosyn for 23 days. Chronic low grade leukocytosis range 13-15 -IV antibiotics were discontinued by Surgery on 4/22. Also received 18 days of Vancomycin and fluconazole.  -Due to Fistula is back to NPO state, TPN. Trickle feeds being attempted. -Fistula study done shows JP drain connection to the duodenal bulb-5-02. - repeat CT abdomen with  3.0 x 3.5 x 5.7 cm collection in the pelvic cul-de-sac and extraluminal air alongside JP drain in anterior abd wall -Drain with old and new blood, Hb trending down, transfuse 2 unit PRBC 5/10 -continues to have high output from Fistula/leak. Had G_J placed by IR 08/12/14 at request of gen surgery -Patient continues to be on octreotide, which is being managed by surgery.  -CT repeat shows 5 cm post cul-de-sac abscess which was drained on 5/16-culture pending  -ID was consulted as patient had Pseudomonas growing from her culture wounds and they started back Flagyl as well as Elita QuickFortaz on 5/16 and are following and we await final growth from her new abscess drained on 5/16. - Continue with tube feedings. The rate was increased to 20 mL per hour yesterday. Seems to be tolerating. TPN rate had to be reduced due to fluid overload. -Patient inadvertently  pulled out her gluteal drain. Seen by interventional radiology and they recommended repeat CT scan, which was done. Shows that the fluid collection has decreased in size. They do not plan to replace the drain at this time. Continue with antibiotics alone.  -CT scan did suggest a transmural defect in the stomach  wall near the patient's pyloric [name] patch. We will let surgery address this.  PSVT  -Felt to be secondary to severe anxiety with dialysis   -Has been evaluated by EP.  -Cardiology following peripherally- follow K+ and Mg  -Continue IV metoprolol, now off amiodarone;  -V-Q scan with intermediate probability for pulmonary embolism.  -CTA 4/27, negative for PE   -Heart rate better controlled with scheduled intravenous metoprolol. Monitor blood pressures closely as she does have a tendency to become hypotensive. Had a 15 beat run of NSVT overnight. Stable. Continue to monitor  ESRD new, now on HD this admit complicated by intermittent hypotension AKi on CKD 4, due to sepsis, shock Ongoing hemodialysis per Nephrology - perm-cath placed 3/26 d/t AVF problems Patient AV fistula reviewed and not mature yet per Dr. Paulene Floor need to revisit prior to d/c Rate of TPN was reduced by nephrology due to concerns for fluid overload.  C. difficile colitis still on contact precautions -C diff positive on 4-12, Received 4 days of flagyl without improvement, then started on PO vanc -Completed 3 weeks of Oral Vanc-stopped 5/7, this was 1 week after IV Zosyn was stopped 5/5 -Diarrhea resolved -keep contact precautions on board   ACA CVA with Lt sided weakness -TEE ;normal LV function; no LAA thrombus -Started on ASA 81mg  per Neuro -Will need ongoing long term rehab - PT/OT to cont to follow while inpatient -Patient remains very weak and deconditioned. Will potentially need CIR when medically stable  Chronic diastolic congestive heart failure w/ Severe RHF EF 60-65%, grade 1 diastolic dysfunction. Fluids/volume managed with hemodialysis.  Elevated PA pressures on 2-D echo; CTA negative for PE  Small PFO Has been evaluated by Cardiology  Anemia of critical illness/chronic disease -and bleeding from leak/fistula -s/p 2U PRBC 4/13 and 5/10 Aranesp/iron per renal -hb stable  Thrombocytopenia;  resolved.  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  -also has COPD and PAH -improving, now weaning off O2 -Nebulizer PRN and CPAP QHS   Septic shock  -due to perforated prepyloric ulcer, required pressors -resolved currently  Diabetes mellitus 2 Hypoglycemia in setting of NPO status. On TPN now, CBGs stabilizedin 120-160s. Hba1c 6.5  Electrolyte imbalance  Being addressed via TPN  Abnormal TSH -Free T4 WNL, T3 1.8 -most likely sick thyroid with ongoing infection and body stress; also due to amiodarone use. TSH in 7 range -continue low dose synthroid given PSVT, now on IV synthroid   L Renal mass  -noted on [place], Indeterminate 1.9 cm hypoechoic mass off the interpolar region of the left kidney -may potentially represent a cyst however solid mass is not excluded -needs outpatient FU for this  Obesity Inadequate oral intake related to altered GI function as evidenced by limited intake, ongoing.  Body mass index is 34.46 kg/(m^2).  Fall on May 18 Examination did not reveal any injuries. She does have reasonably good range of motion of the left upper extremity and left  lower extremity all joints. No need for imaging studies.  DVT prophylaxis: SCDs Code Status: FULL Family Communication:  Discussed with patient. No family at bedside. Disposition Plan: Not ready for discharge. Ongoing management for abdominal abscesses and poor nutritional status.  Consultants: Cardiology Nephrology PCCM Gen Surgery  Palliative care  Significant Events: 3/16 influenza A+ 3/18 R ACA stroke 3/19 MRI multifocal acute infarction of both hemispheres, large right distal ACA 3/22 TEE severe RAE, severely reduced RV function,  severe TR, no LAA thrombus, small PFO on TEE 3/31 to OR ex lap for per pyloric ulcer and sm bowel resection 4/1 septic shock on pressors 4/3 pressors weaned off  4/4 restart pressors, CRRT 4/6 levo switched to neo 4/7 pelvic abscess on CT 4/8 IR placed pelvic drain 4/9 off pressors 4/11 long run of SVT. 4/23 VQ scan which demonstrated intermediate probability for PE 4/24 CT angiogram of her chest: Pending/unable to be done due to lack of access    Objective: Blood pressure 103/80, pulse 106, temperature 98.3 F (36.8 C), temperature source Oral, resp. rate 19, height  (1.676 m), weight 96.8 kg (213 lb 6.5 oz), SpO2 100 %.  Intake/Output Summary (Last 24 hours) at 08/22/14 0834 Last data filed at 08/22/14 0600  Gross per 24 hour  Intake   1015 ml  Output   2750 ml  Net  -1735 ml   Exam: General: AAOx3, appears fatigued Lungs: Few end expiratory wheezing bilaterally. Diminished air entry at the bases Cardiovascular: Regular rate, no murmurs and no gallops, sinus tachycardia  Abdomen: Abdominal wound dressing noted-wounds not examined. Multiple drains also noted. G-tube also present.  Left-sided weakness from stroke  Data Reviewed: Basic Metabolic Panel:  Recent Labs Lab 08/16/14 0641 08/17/14 0430  08/19/14 0730 08/20/14 0500 08/21/14 0253 08/21/14 0825 08/21/14 1526 08/22/14 0530  NA 132* 131*  < > 132* 130* 132* 132* 136  --   K 4.4 4.2  < > 3.4* 3.7 4.2 4.1 4.1  --   CL 95* 95*  < > 96* 96* 98* 96* 101  --   CO2 28 26  < > --   GLUCOSE 127* 68  < > 119* 125* 123* 138* 107*  --   BUN 45* 71*  < > 50* 67* 34* 39* 11  --   CREATININE 3.59* 4.80*  < > 3.81* 4.66* 2.98* 3.20* 1.71*  --   CALCIUM 7.6* 7.9*  < > 7.5* 7.6* 7.8* 7.8* 7.7*  --   MG 2.2 2.3  --   --  2.0 1.9  --   --  2.0  PHOS 4.0 6.2*  --   --  4.4 3.6 3.9 2.1* 3.3  < > = values in this interval not displayed.  Liver Function Tests:  Recent Labs Lab 08/18/14 0449  08/19/14 0551 08/19/14 0730 08/20/14 0500 08/21/14 0253 08/21/14 0825 08/21/14 1526  AST --   --   ALT 8* 11* 12* 8* 9*  --   --   ALKPHOS 163* 140* 156* 139* 151*  --   --   BILITOT 1.2 1.0 1.1 0.9 0.8  --   --   PROT 6.2* 5.8* 6.2* 6.2* 6.4*  --   --   ALBUMIN 1.5* 1.4* 1.6* 1.5* 1.6* 1.6* 2.0*   CBC:  Recent Labs Lab 08/17/14 0430  08/19/14 0730 08/20/14 0500 08/21/14 0253 08/21/14 0824 08/21/14 1527 08/22/14 0530  WBC 9.8  < >  11.7* 11.0* 12.4* 13.4* 12.1* 14.4*  NEUTROABS 7.3  --  8.5*  --   --   --   --   --   HGB 11.3*  < > 8.0* 7.9* 8.5* 8.1* 7.9* 7.9*  HCT 36.3  < > 26.1* 25.6* 27.6* 26.4* 25.2* 25.5*  MCV 90.5  < > 90.3 89.8 90.5 92.0 92.0 93.4  PLT 203  < > 240 260 328 276 262 281  < > = values in this interval not displayed.  CBG:  Recent Labs Lab 08/21/14 0549 08/21/14 1230 08/21/14 1808 08/22/14 0004 08/22/14 0556  GLUCAP 138* 100* 126* 151* 148*    Scheduled Meds:  Scheduled Meds: . antiseptic oral rinse  7 mL Mouth Rinse q12n4p  . budesonide (PULMICORT) nebulizer solution  0.25 mg Nebulization BID  . cefTAZidime (FORTAZ)  IV  2 g Intravenous Q T,Th,Sa-HD  . chlorhexidine  15 mL Mouth Rinse BID  . darbepoetin (ARANESP) injection - DIALYSIS  100 mcg Intravenous Q Thu-HD  . feeding supplement (VITAL AF 1.2 CAL)  1,000 mL Per Tube Q24H  . ferric gluconate (FERRLECIT/NULECIT) IV  125 mg Intravenous Q T,Th,Sa-HD  . insulin aspart  0-9 Units Subcutaneous 4 times per day  . levothyroxine  12.5 mcg Intravenous Daily  . metoprolol  2.5 mg Intravenous 4 times per day  . metronidazole  500 mg Intravenous 3 times per day  . sodium chloride  10-40 mL Intracatheter Q12H     Alyha Marines [facility] (P) [phone]   If 7PM-7AM, please contact night-coverage [email or web address] Password [facility] 08/22/2014, 8:34 AM   LOS: 66 days

## 2014-08-22 NOTE — Progress Notes (Addendum)
Patient had a 15 beat run of v. Tach. Notified Claiborne Billingsallahan NP. No orders received at this time.

## 2014-08-22 NOTE — Progress Notes (Signed)
Wet to dry dressing done to mid abd.

## 2014-08-22 NOTE — Progress Notes (Signed)
[name], female   DOB: [birth year], 59 y.o.   MRN: [id number]   Pelvic abscess drain placed 5/18 Output minimal per notes ?? 400 cc in chart??  Accidentally pulled out 5/19 Re Ct 5/20 shows very small collection On antibiotic  Discussed with Dr Randa LynnHenn Afeb; wbc 14 Will HOLD replacement at this time  If pt develops fever; abd pain Please re consult IR for possible replacement

## 2014-08-22 NOTE — Progress Notes (Signed)
[place] for TPN Indication:  High output Duodenal Fistula  Allergies  Allergen Reactions  . 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    Patient Measurements: Height: 5\' 6"  (167.6 cm) Weight: 209 lb 3.5 oz (94.9 kg) IBW/kg (Calculated) : 59.3 Adjusted Body Weight: 71.2kg  Vital Signs: Temp: 98 F (36.7 C) (05/21 1130) Temp Source: Oral (05/21 1130) BP: 93/66 mmHg (05/21 1130) Pulse Rate: 110 (05/21 1130) Intake/Output from previous day: 05/20 0701 - 05/21 0700 In: 1245 [I.V.:175; NG/GT:140; IV Piggyback:150; TPN:550] Out: 2750 [Drains:650]  Labs:  Recent Labs  08/21/14 0824 08/21/14 1527 08/22/14 0530  WBC 13.4* 12.1* 14.4*  HGB 8.1* 7.9* 7.9*  HCT 26.4* 25.2* 25.5*  PLT 276 262 281    Recent Labs  08/20/14 0500 08/21/14 0253 08/21/14 0825 08/21/14 1526 08/22/14 0530  NA 130* 132* 132* 136  --   K 3.7 4.2 4.1 4.1  --   CL 96* 98* 96* 101  --   CO2 26 25 27 29   --   GLUCOSE 125* 123* 138* 107*  --   BUN 67* 34* 39* 11  --   CREATININE 4.66* 2.98* 3.20* 1.71*  --   CALCIUM 7.6* 7.8* 7.8* 7.7*  --   MG 2.0 1.9  --   --  2.0  PHOS 4.4 3.6 3.9 2.1* 3.3  PROT 6.2* 6.4*  --   --   --   ALBUMIN 1.5* 1.6* 1.6* 2.0*  --   AST 20 25  --   --   --   ALT 8* 9*  --   --   --   ALKPHOS 139* 151*  --   --   --   BILITOT 0.9 0.8  --   --   --    Estimated Creatinine Clearance: 41.6 mL/min (by C-G formula based on Cr of 1.71).   Recent Labs  08/21/14 1808 08/22/14 0004 08/22/14 0556  GLUCAP 126* 151* 148*   Insulin Requirements in the past 24 hours:  3 unit Novolog SSI since 1800 last night  Current Nutrition:  Clinimix E5/15 at 62ml/hr and 20% IVFE at 67ml/hr  Vital AF 1.2 at 54ml/hr (Provided a total of 2078kcal/day + 108gm protein/day - 100% Kcal goal, 90% protein goal)  Nutritional Goals: per RD assessment 5/12 2100-2300 kCal, 120-140 grams of protein per day Goal: Clinimix 5/15 at 119ml/hr + 20% IVFE 58ml/hr to provide 2184  kcal and 120 gm protein  Assessment: 37 YOF with complicated hospital course. S/p closure perforated pyloric ulcer, SBR for ischemic necrosis, diffuse peritonitis on 07/01/14. Pharmacy consulted to provide TPN 4/1-4/15 for nutrition support for prolonged ileus. Pt then transitioned to po diet + supplements. 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  YK:DXIPJASN fistula. NPO.Open abd wound, (+)abd pain. Octreotide drip ordered 5/10 am. Albumin 1.7. Prealbumin remains low at 7.9. Tube feeds started now running to 16ml/hr.  5/10 Add octreotide drip 5/11 IR placed gastro-jenjunal tube for post-pyloric feeding and drainage of gastric juices 5/12 to start Vital AF 1.2 trickle feedings via J tube at 10 ml/hr 5/13 Continues on trickle feeds at 10 ml/hr 5/14: Possible leak in GJ tube - TFs held. Imaging showed no change in JP position with tips in proper positions however pt still with pre-rectal fluid collection 5/15: TFs still being held. IR to replace drain for fluid collection 5/16: IR percutaneous placement of drain in pelvis with aspiration/drainage  5/19  Abscess drain accidentally pulled  Endo:Hx DM/hypothyroidism - CBGs well controlled on SSI alone, synthroid (new), TSH 7.3 Lytes: Lytes being added and removed to the TPN bag as needed based on labs. Na 136, K 4.1, Mg 1.9, Phos 3.3, CoCa 9.72 (Ca x Phos = 32) Per discussion with renal-pharmacy to manage K  Renal: New ESRD - started 3/17. HD on TTS. NS kvo, got extra HD yesterday as fluid has been difficult to manage - Renal requested that TPN be reduced to 25-24mL/hr to assist with volume management Heme: Anemia of ESRD - H/H 7.9/25.5 plts 281 - aranesp Pulm: Hx asthma/COPD. 100% RA. CT negative for PE Cards: HTN, afib - IV metoprolol scheduled, CHADS-VASc =4 (not good candidate for [facility]). 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  Hepatotobil: Alk phos elevated, other LFTs + Tbili WNL,  TG 142 (5/16) Neuro: Hx depression / insomnia - new stroke noted on 3/18.  ID: Afebrile, WBC 14.4, s/p treatment for CDiff and s/p multiple courses of abx during admission. Hep B Ag negative. Flagyl resumed empirically on 5/17 when ceftaz was resumed. 5/14 wound cx (abdomen): pseudomonas   5/17 ceftazidime >> 5/17 flagyl >> Best Practices: SCDs, PPI IV TPN Access: PICC line in IR 4/27 TPN start date: 4/1 >> 4/15; restart 4/27>>  Plan:  - Decrease Clinimix E5/15 to 33ml/hr per MD request to limit fluids, continue IVFE at 77ml/hr - (Starting tonight TPN will provide 991 Kcal/day + 36gm protein and TF at 27ml/hr provide 576 cal/day + 35.6gm protein/day. Total of 1567 kcal/day + 71.6gm protein/day >>60% protein goal, 75% kcal goal - see above 'Current Nutrition' to see what she received yesterday) - would expect that tube feeds could be titrated up further to provide further nutrition to better meet her needs - Continue MVI + TE in TPN - Continue sensitive SSI - Continue ascorbic acid 1gm in TPN - MD wants a total of 14 days (started in TPN 5/18) - will need to reorder when TPN is discontinued - F/u tolerance of tube feeds and ability to titrate up and discontinue TPN  Salome Arnt, PharmD, BCPS Pager # [phone] 08/22/2014 12:54 PM

## 2014-08-22 NOTE — Progress Notes (Signed)
RT Note: Pt has refused cpap at this time.

## 2014-08-23 ENCOUNTER — Inpatient Hospital Stay (HOSPITAL_COMMUNITY): Payer: Medicaid Other

## 2014-08-23 DIAGNOSIS — D649 Anemia, unspecified: Secondary | ICD-10-CM

## 2014-08-23 LAB — CBC
HEMATOCRIT: 26.2 % — AB (ref 36.0–46.0)
Hemoglobin: 7.8 g/dL — ABNORMAL LOW (ref 12.0–15.0)
MCH: 28.2 pg (ref 26.0–34.0)
MCHC: 29.8 g/dL — ABNORMAL LOW (ref 30.0–36.0)
MCV: 94.6 fL (ref 78.0–100.0)
PLATELETS: 307 10*3/uL (ref 150–400)
RBC: 2.77 MIL/uL — ABNORMAL LOW (ref 3.87–5.11)
RDW: 21.3 % — ABNORMAL HIGH (ref 11.5–15.5)
WBC: 15.3 10*3/uL — ABNORMAL HIGH (ref 4.0–10.5)

## 2014-08-23 LAB — MAGNESIUM: MAGNESIUM: 2 mg/dL (ref 1.7–2.4)

## 2014-08-23 LAB — BASIC METABOLIC PANEL
Anion gap: 8 (ref 5–15)
BUN: 14 mg/dL (ref 6–20)
CO2: 30 mmol/L (ref 22–32)
Calcium: 8.3 mg/dL — ABNORMAL LOW (ref 8.9–10.3)
Chloride: 99 mmol/L — ABNORMAL LOW (ref 101–111)
Creatinine, Ser: 2.43 mg/dL — ABNORMAL HIGH (ref 0.44–1.00)
GFR calc Af Amer: 24 mL/min — ABNORMAL LOW (ref >60)
GFR calc non Af Amer: 21 mL/min — ABNORMAL LOW (ref >60)
GLUCOSE: 120 mg/dL — AB (ref 65–99)
POTASSIUM: 4.3 mmol/L (ref 3.5–5.1)
Sodium: 137 mmol/L (ref 135–145)

## 2014-08-23 LAB — TROPONIN I: TROPONIN I: 0.04 ng/mL — AB (ref <0.031)

## 2014-08-23 LAB — GLUCOSE, CAPILLARY
GLUCOSE-CAPILLARY: 124 mg/dL — AB (ref 65–99)
Glucose-Capillary: 109 mg/dL — ABNORMAL HIGH (ref 65–99)
Glucose-Capillary: 103 mg/dL — ABNORMAL HIGH (ref 65–99)

## 2014-08-23 LAB — PHOSPHORUS: Phosphorus: 3.3 mg/dL (ref 2.5–4.6)

## 2014-08-23 MED ORDER — FAT EMULSION 20 % IV EMUL
240.0000 mL | INTRAVENOUS | Status: AC
  Administered 2014-08-23: 240 mL via INTRAVENOUS
  Filled 2014-08-23: qty 250

## 2014-08-23 MED ORDER — VITAL AF 1.2 CAL PO LIQD
1000.0000 mL | ORAL | Status: DC
  Filled 2014-08-23 (×2): qty 1000

## 2014-08-23 MED ORDER — M.V.I. ADULT IV INJ
INJECTION | INTRAVENOUS | Status: AC
  Administered 2014-08-23: 17:00:00 via INTRAVENOUS
  Filled 2014-08-23: qty 720

## 2014-08-23 NOTE — Progress Notes (Signed)
[facility] progress note. Chief complaint. Chest pain. History of present illness. This 59 year old female in hospital with a perforated pyloric ulcer status post surgical closure, PSVT, end-stage renal disease on hemodialysis, C. difficile colitis, etc. He complained of central chest pain to the nursing staff. 12-lead EKG was obtained and I reviewed this. The EKG does not look significantly different than prior and does not show evidence of acute ischemia. I came to see the patient at bedside. Surgery was present replacing a abdominal drain which had inadvertently been displaced. The patient denied any current chest pain. Vital signs temperature 97.6, pulse 104, respiration 20, blood pressure 93/66. O2 sats 97%. General appearance. Frail elderly female who is somnolent but easily roused. Cardiac. Rate and rhythm regular. Lungs. Breath sounds reduced but clear. Abdomen. Exam deferred. Impression/plan. Problem #1. Chest pain. Current EKG does not look significantly different than prior and does not look acutely ischemic. Troponins are ordered and we'll follow for any abnormality. I will repeat a 12-lead EKG and proximally 6 hours to evaluate for any changes. A chest x-ray was also obtained and this resulted unremarkable.

## 2014-08-23 NOTE — Progress Notes (Signed)
53 Days Post-Op  Subjective: Pt with no acute changes.   Tol Tfs @ 20cc/hr  Objective: Vital signs in last 24 hours: Temp:  [96.5 F (35.8 C)-98.9 F (37.2 C)] 96.5 F (35.8 C) (05/22 0717) Pulse Rate:  [83-115] 100 (05/22 0717) Resp:  [18-34] 18 (05/22 0717) BP: (87-130)/(56-97) 100/56 mmHg (05/22 0717) SpO2:  [93 %-100 %] 97 % (05/22 0717) Weight:  [94.9 kg (209 lb 3.5 oz)-95.074 kg (209 lb 9.6 oz)] 95.074 kg (209 lb 9.6 oz) (05/22 0430) Last BM Date: 08/21/14  Intake/Output from previous day: 05/21 0701 - 05/22 0700 In: 979.8 [I.V.:625; NG/GT:120; TPN:234.8] Out: 4030 [Drains:1700] Intake/Output this shift:    General appearance: alert and cooperative GI: soft, non-tender; bowel sounds normal; no masses,  no organomegaly and JP bilious Incision/Wound: wound c/d/i, pink  Lab Results:   Recent Labs  08/22/14 0530 08/23/14 0415  WBC 14.4* 15.3*  HGB 7.9* 7.8*  HCT 25.5* 26.2*  PLT 281 307   BMET  Recent Labs  08/21/14 1526 08/23/14 0415  NA 136 137  K 4.1 4.3  CL 101 99*  CO2 29 30  GLUCOSE 107* 120*  BUN 11 14  CREATININE 1.71* 2.43*  CALCIUM 7.7* 8.3*    Studies/Results: Ct Abdomen Pelvis Wo Contrast  08/22/2014   CLINICAL DATA:  Abdominal abscess. No intravenous contrast due to renal failure.  EXAM: CT ABDOMEN AND PELVIS WITHOUT CONTRAST  TECHNIQUE: Multidetector CT imaging of the abdomen and pelvis was performed following the standard protocol without IV contrast.  COMPARISON:  08/15/2014  FINDINGS: BODY WALL: Open laparotomy without fluid collection.  LOWER CHEST: Chronic triangular opacity in the posterior costophrenic sulcus on the left, atelectasis versus scarring  ABDOMEN/PELVIS:  Liver: No focal abnormality.  Biliary: High-density material within the gallbladder showing mild wall thickening which is chronic.  Pancreas: Unremarkable.  Spleen: Unremarkable.  Adrenals: Unremarkable.  Kidneys and ureters: No hydronephrosis or stone. Presumed 1 cm cyst  from the interpolar left kidney  Bladder: Decompressed.  No pathologic findings.  Reproductive: Calcified and noncalcified uterine fibroids, better seen on previous enhanced imaging.  Bowel: Right upper quadrant drain related to perforated pyloric ulcer status post Cheree Ditto patch. Specially visible on coronal imaging, there is a gas channel through the pylorus measuring 6 to 9 mm in diameter. No bowel obstruction. Enteroenterostomy noted in the pelvis. Oral contrast again seen in the colon. No notable colonic wall thickening in this patient with history of C difficile. Negative appendix. Percutaneous gastrojejunostomy tube is in good position.  Retroperitoneum: No mass or adenopathy.  Peritoneum: Known thick walled collection in the rectovaginal recess smaller than 08/15/2014 at 40 x 16 mm as compared to 51 x 32 mm. No new collection is identified. No free pneumoperitoneum.  Vascular: 3 cm fusiform infrarenal aortic aneurysm.  OSSEOUS: No acute abnormalities.  IMPRESSION: 1. Near the patient's pyloric Cheree Ditto patch is a transmural defect in the stomach wall, contiguous with the surgical drain. If repeat surgery is considered, oral contrast could confirm an open ulcer. 2. Decreased rectovaginal recess fluid collection, now 4 x 1.5 cm (previously 5 x 3 cm).   Electronically Signed   By: Marnee Spring M.D.   On: 08/22/2014 01:49    Anti-infectives: Anti-infectives    Start     Dose/Rate Route Frequency Ordered Stop   08/21/14 1430  cefTAZidime (FORTAZ) 1 g in dextrose 5 % 50 mL IVPB     1 g 100 mL/hr over 30 Minutes Intravenous  Once 08/21/14 1426  08/21/14 1547   08/18/14 1300  metroNIDAZOLE (FLAGYL) IVPB 500 mg     500 mg 100 mL/hr over 60 Minutes Intravenous 3 times per day 08/18/14 1045     08/18/14 1200  cefTAZidime (FORTAZ) 2 g in dextrose 5 % 50 mL IVPB     2 g 100 mL/hr over 30 Minutes Intravenous Every T-Th-Sa (Hemodialysis) 08/18/14 1054     08/12/14 1449  ceFAZolin (ANCEF) 2-3 GM-% IVPB SOLR     Comments:  Troy Sine   : cabinet override      08/12/14 1449 08/12/14 1629   08/12/14 1130  ceFAZolin (ANCEF) IVPB 2 g/50 mL premix    Comments:  Hang ON CALL to xray 5/11   2 g 100 mL/hr over 30 Minutes Intravenous To Radiology 08/12/14 1035 08/12/14 1520   08/04/14 1200  vancomycin (VANCOCIN) 50 mg/mL oral solution 125 mg  Status:  Discontinued     125 mg Oral 4 times per day 08/04/14 0851 08/08/14 1229   07/18/14 1800  vancomycin (VANCOCIN) 50 mg/mL oral solution 125 mg  Status:  Discontinued     125 mg Oral 4 times per day 07/18/14 1455 08/04/14 0845   07/17/14 1830  vancomycin (VANCOCIN) IVPB 750 mg/150 ml premix     750 mg 150 mL/hr over 60 Minutes Intravenous  Once 07/17/14 1818 07/18/14 0024   07/15/14 1400  metroNIDAZOLE (FLAGYL) tablet 500 mg  Status:  Discontinued     500 mg Oral 3 times per day 07/15/14 1202 07/18/14 1533   07/13/14 2200  piperacillin-tazobactam (ZOSYN) IVPB 2.25 g  Status:  Discontinued     2.25 g 100 mL/hr over 30 Minutes Intravenous 3 times per day 07/13/14 1318 07/24/14 1138   07/13/14 2000  fluconazole (DIFLUCAN) IVPB 200 mg  Status:  Discontinued     200 mg 100 mL/hr over 60 Minutes Intravenous Every 24 hours 07/13/14 1318 07/19/14 1027   07/07/14 1200  vancomycin (VANCOCIN) IVPB 1000 mg/200 mL premix  Status:  Discontinued     1,000 mg 200 mL/hr over 60 Minutes Intravenous Every 24 hours 07/07/14 0937 07/14/14 1712   07/06/14 2000  fluconazole (DIFLUCAN) IVPB 400 mg  Status:  Discontinued     400 mg 100 mL/hr over 120 Minutes Intravenous Every 24 hours 07/06/14 1507 07/13/14 1318   07/06/14 1800  piperacillin-tazobactam (ZOSYN) IVPB 2.25 g  Status:  Discontinued     2.25 g 100 mL/hr over 30 Minutes Intravenous 4 times per day 07/06/14 1505 07/13/14 1318   07/05/14 2000  fluconazole (DIFLUCAN) IVPB 200 mg  Status:  Discontinued     200 mg 100 mL/hr over 60 Minutes Intravenous Every 24 hours 07/05/14 0757 07/06/14 1507   07/05/14 1400   piperacillin-tazobactam (ZOSYN) IVPB 2.25 g  Status:  Discontinued     2.25 g 100 mL/hr over 30 Minutes Intravenous 3 times per day 07/05/14 0749 07/06/14 1505   07/02/14 1800  vancomycin (VANCOCIN) IVPB 1000 mg/200 mL premix  Status:  Discontinued     1,000 mg 200 mL/hr over 60 Minutes Intravenous Every 24 hours 07/01/14 1858 07/05/14 0801   07/01/14 2200  piperacillin-tazobactam (ZOSYN) IVPB 3.375 g  Status:  Discontinued     3.375 g 100 mL/hr over 30 Minutes Intravenous 4 times per day 07/01/14 1858 07/05/14 0749   07/01/14 2000  fluconazole (DIFLUCAN) IVPB 400 mg  Status:  Discontinued     400 mg 100 mL/hr over 120 Minutes Intravenous Every 24 hours 07/01/14 1858 07/05/14  [id number]   07/01/14 1400  fluconazole (DIFLUCAN) IVPB 200 mg  Status:  Discontinued     200 mg 100 mL/hr over 60 Minutes Intravenous Every 24 hours 07/01/14 1330 07/01/14 1853   07/01/14 1000  piperacillin-tazobactam (ZOSYN) IVPB 2.25 g  Status:  Discontinued     2.25 g 100 mL/hr over 30 Minutes Intravenous Every 8 hours 07/01/14 0952 07/01/14 1853   07/01/14 1000  vancomycin (VANCOCIN) 500 mg in sodium chloride 0.9 % 100 mL IVPB     500 mg 100 mL/hr over 60 Minutes Intravenous  Once 07/01/14 0952 07/01/14 1126   06/30/14 1200  vancomycin (VANCOCIN) IVPB 1000 mg/200 mL premix     1,000 mg 200 mL/hr over 60 Minutes Intravenous  Once 06/30/14 0944 06/30/14 1403   06/30/14 1200  ceFEPIme (MAXIPIME) 2 g in dextrose 5 % 50 mL IVPB     2 g 100 mL/hr over 30 Minutes Intravenous  Once 06/30/14 0944 06/30/14 1425   06/30/14 0100  vancomycin (VANCOCIN) 2,000 mg in sodium chloride 0.9 % 500 mL IVPB     2,000 mg 250 mL/hr over 120 Minutes Intravenous  Once 06/30/14 0048 06/30/14 0525   06/30/14 0100  ceFEPIme (MAXIPIME) 2 g in dextrose 5 % 50 mL IVPB     2 g 100 mL/hr over 30 Minutes Intravenous  Once 06/30/14 0048 06/30/14 0322   06/27/14 0600  cefUROXime (ZINACEF) 1.5 g in dextrose 5 % 50 mL IVPB     1.5 g 100 mL/hr over 30  Minutes Intravenous On call to O.R. 06/26/14 1019 06/27/14 0630   06/19/14 1800  oseltamivir (TAMIFLU) capsule 30 mg     30 mg Oral Every M-W-F (1800) 06/19/14 1003 06/22/14 1841   06/18/14 1600  oseltamivir (TAMIFLU) capsule 30 mg  Status:  Discontinued     30 mg Oral Daily 06/18/14 1538 06/19/14 1003      Assessment/Plan: POD #53 s/p Exploratory Laparotomy with graham patch closure of perforated pyloric ulcer, SBR for ischemic bowel - 07/01/2014 - Derrell LollingIngram  Intra-abdominal (pelvic) fluid collection Now with duodenal fistula -CT guided aspiration CX negx3d -Trickle J port TF. Advance these slowly 25cc/hr ID-Cipro/Flagyl per ID. C diff colitis New ESRD PCM/TNA - wean TNA if she tolerates TF advancement   LOS: 67 days    Marigene Ehlersamirez Jr., Jed Limerickrmando 08/23/2014

## 2014-08-23 NOTE — Progress Notes (Signed)
RT note: Pt refuses cpap at this time.  RT will continue to monitor. 

## 2014-08-23 NOTE — Progress Notes (Signed)
[name] ZOX:[id number] DOB: 10/27/1955 DOA: 06/17/2014 PCP: [name], MD   Brief Narrative: 59 yo ? smoker with hx HTN, COPD, CKD IV followed @ [facility]-[name] Page, failed L AV fistula, initially admitted 3/16 with flu and acute hypoxic resp failure. Ultimately tx to [facility] due to need for HD. L sided weakness 3/18 and found to have R ACA stroke-showed acute infarct R ACA. Patient more lethargic 3/30 and CT abd=perf viscus 3/31. She was taken to the OR for ex lap for perforated pyloric ulcer with diffuse peritonitis, ischemic necrosis of mid ileum. Patient developed septic shock on 4-1, she was started on pressors and wean off pressors on 4-3. Subsequently she was diagnosed with pelvic abscess by CT scan perform on 4-7. She underwent pelvic drain placement by IR. C. diff diagnosed 4-12-initially unresponsive to flagyl, and she was started on vancomycin 4-16. Develops  run of SVT. Cardiology was helping with management. Course complicated with high RV pressure, CT angio negative for PE.  Also with duodenal fistula, making her back to NPO state and in need of TPN. Palliative care consulted. On 5/2 she developed hypoxemia, hypotension and SVT during dialysis treatment. Dialysis was stopped. She received IV bolus. She was started on BIPAP. She was transferred to Step down unit. PCCM and cardiology re consulted. Her Hr decreased, BP improved. She was taken off BIPAP. This recurred on 5/15 with episodic SVT.  Now stable, on TNA + very high output from fistula/leak per [facility]. Had a G_J tube placed by Dr. [name] of IR and Gen surgery has continued to assist with management of her multiple surgical comorbidities. CT repeat 5/14 showed 5 cm abscess-transgluteal drain with 5 ml of purulent fluid removed on 5/17.  Gluteal drain was inadvertently pulled out on May 19. Repeat CT scan shows improvement in the fluid collection.  CIR is interested in taking her if she can stabilize enough from her medical  conditions  Subjective: Patient reports much less discomfort and pain. Breathing is better. No nausea, vomiting.   Assessment/Plan:  Status post exploratory laparotomy with closure of perforated pyloric ulcer/abdominal abscess / small bowel resection w/ postop ileus and now with fistula. Pelvic abscess s/p percutaneous drain placement 4/8; had leakage from anastomosis site. Received Zosyn for 23 days. Chronic low grade leukocytosis range 13-15 -IV antibiotics were discontinued by Surgery on 4/22. Also received 18 days of Vancomycin and fluconazole.  -Due to Fistula is back to NPO state, TPN. Trickle feeds being attempted. -Fistula study done shows JP drain connection to the duodenal bulb-5-02. - repeat CT abdomen with  3.0 x 3.5 x 5.7 cm collection in the pelvic cul-de-sac and extraluminal air alongside JP drain in anterior abd wall -Drain with old and new blood, Hb trending down, transfuse 2 unit PRBC 5/10 -continues to have high output from Fistula/leak. Had G_J placed by IR 08/12/14 at request of gen surgery -Patient continues to be on octreotide, which is being managed by surgery.  -CT repeat shows 5 cm post cul-de-sac abscess which was drained on 5/16-culture pending  -ID was consulted as patient had Pseudomonas growing from her culture wounds and they started back Flagyl as well as Elita Quick on 5/16 and are following and we await final growth from her new abscess drained on 5/16. - Continue with tube feedings. Seems to be tolerating. TPN rate had to be reduced due to fluid overload. -Patient inadvertently pulled out her gluteal drain. Seen by interventional radiology and they recommended repeat CT scan, which  was done. Shows that the fluid collection has decreased in size. They do not plan to replace the drain at this time. Continue with antibiotics alone.  -CT scan did suggest a transmural defect in the stomach wall near the patient's pyloric Cheree DittoGraham patch. We will let surgery address  this. -Surgery to assess need for octreotide tomorrow. Tube Feeding rate has been increased today.  PSVT  -Felt to be secondary to severe anxiety with dialysis   -Has been evaluated by EP.  -Cardiology following peripherally- follow K+ and Mg  -Continue IV metoprolol, now off amiodarone;  -V-Q scan with intermediate probability for pulmonary embolism.  -CTA 4/27, negative for PE   -Heart rate better controlled with scheduled intravenous metoprolol. Monitor blood pressures closely as she does have a tendency to become hypotensive. Had a 15 beat run of NSVT overnight. Stable. Continue to monitor  ESRD new, now on HD this admit complicated by intermittent hypotension AKi on CKD 4, due to sepsis, shock Ongoing hemodialysis per Nephrology - perm-cath placed 3/26 d/t AVF problems Patient AV fistula reviewed and not mature yet per Dr. Paulene FloorFields-will need to revisit prior to d/c Rate of TPN was reduced by nephrology due to concerns for fluid overload.  C. difficile colitis still on contact precautions -C diff positive on 4-12, Received 4 days of flagyl without improvement, then started on PO vanc -Completed 3 weeks of Oral Vanc-stopped 5/7, this was 1 week after IV Zosyn was stopped 5/5 -Diarrhea resolved -keep contact precautions on board   ACA CVA with Lt sided weakness -TEE ;normal LV function; no LAA thrombus -Started on ASA 81mg  per Neuro -Will need ongoing long term rehab - PT/OT to cont to follow while inpatient -Patient remains very weak and deconditioned. Will potentially need CIR when medically stable  Chronic diastolic congestive heart failure w/ Severe RHF EF 60-65%, grade 1 diastolic dysfunction. Fluids/volume managed with hemodialysis.  Elevated PA pressures on 2-D echo; CTA negative for PE  Small PFO Has been evaluated by Cardiology  Anemia of critical illness/chronic disease -and bleeding from leak/fistula which has resolved. -s/p 2U PRBC 4/13 and 5/10 Aranesp/iron per  renal -hb stable  Thrombocytopenia; resolved.  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  -also has COPD and PAH -improving, now weaning off O2 -Nebulizer PRN and CPAP QHS   Septic shock  -due to perforated prepyloric ulcer, required pressors -resolved currently  Diabetes mellitus 2 Hypoglycemia in setting of NPO status. On TPN now, CBGs stabilizedin 120-160s. Hba1c 6.5  Electrolyte imbalance  Being addressed via TPN  Abnormal TSH -Free T4 WNL, T3 1.8 -most likely sick thyroid with ongoing infection and body stress; also due to amiodarone use. TSH in 7 range -continue low dose synthroid given PSVT, now on IV synthroid   L Renal mass  -noted on US, Indeterminate 1.9 cm hypoechoic mass off the interpolar region of the left kidney -may potentially represent a cyst however solid mass is not excluded -needs outpatient FU for this  Obesity Inadequate oral intake related to altered GI function as evidenced by limited intake, ongoing.  Body mass index is 33.85 kg/(m^2).  Fall on May 18 Examination did not reveal any injuries. She was noted to have reasonably good range of motion of the left upper  extremity and left lower extremity all joints.   DVT prophylaxis: SCDs Code Status: FULL Family Communication:  Discussed with patient. No family at bedside. Disposition Plan: Not ready for discharge. Ongoing management for abdominal abscesses and poor nutritional status.  Consultants: Cardiology Nephrology PCCM Gen Surgery  Palliative care  Significant Events: 3/16 influenza A+ 3/18 R ACA stroke 3/19 MRI multifocal acute infarction of both hemispheres, large right distal ACA 3/22 TEE severe RAE, severely reduced  RV function, severe TR, no LAA thrombus, small PFO on TEE 3/31 to OR ex lap for per pyloric ulcer and sm bowel resection 4/1 septic shock on pressors 4/3 pressors weaned off  4/4 restart pressors, CRRT 4/6 levo switched to neo 4/7 pelvic abscess on CT 4/8 IR placed pelvic drain 4/9 off pressors 4/11 long run of SVT. 4/23 VQ scan which demonstrated intermediate probability for PE 4/24 CT angiogram of her chest: Pending/unable to be done due to lack of access    Objective: Blood pressure 100/56, pulse 100, temperature 96.5 F (35.8 C), temperature source Oral, resp. rate 18, height  (1.676 m), weight 95.074 kg (209 lb 9.6 oz), SpO2 97 %.  Intake/Output Summary (Last 24 hours) at 08/23/14 0759 Last data filed at 08/23/14 0606  Gross per 24 hour  Intake 979.84 ml  Output   4030 ml  Net -3050.16 ml   Exam: General: AAOx3, appears fatigued Lungs: Improved air entry today. Scattered wheezes. No crackles.  Cardiovascular: Regular rate, no murmurs and no gallops, sinus tachycardia  Abdomen: Abdominal wound dressing noted-wounds not examined. Multiple drains also noted. G-tube also present.  Left-sided weakness from stroke  Data Reviewed: Basic Metabolic Panel:  Recent Labs Lab 08/17/14 0430  08/20/14 0500 08/21/14 0253 08/21/14 0825 08/21/14 1526 08/22/14 0530 08/23/14 0415  NA 131*  < > 130* 132* 132* 136  --  137  K 4.2  < > 3.7 4.2 4.1 4.1  --  4.3  CL 95*  < > 96* 98* 96* 101  --  99*  CO2 26  < > --  30  GLUCOSE 68  < > 125* 123* 138* 107*  --  120*  BUN 71*  < > 67* 34* 39* 11  --  14  CREATININE 4.80*  < > 4.66* 2.98* 3.20* 1.71*  --  2.43*  CALCIUM 7.9*  < > 7.6* 7.8* 7.8* 7.7*  --  8.3*  MG 2.3  --  2.0 1.9  --   --  2.0 2.0  PHOS 6.2*  --  4.4 3.6 3.9 2.1* 3.3 3.3  < > = values in this interval not displayed.  Liver Function Tests:  Recent Labs Lab 08/18/14 0449 08/19/14 0551 08/19/14 0730 08/20/14 0500 08/21/14 0253 08/21/14 0825  08/21/14 1526  AST --   --   ALT 8* 11* 12* 8* 9*  --   --   ALKPHOS 163* 140* 156* 139* 151*  --   --   BILITOT 1.2 1.0 1.1 0.9 0.8  --   --   PROT 6.2* 5.8* 6.2* 6.2* 6.4*  --   --   ALBUMIN 1.5* 1.4* 1.6* 1.5* 1.6* 1.6* 2.0*   CBC:  Recent Labs Lab 08/17/14 0430  08/19/14 0730  08/21/14 0253 08/21/14 0824 08/21/14 1527 08/22/14 0530 08/23/14 0415  WBC 9.8  < > 11.7*  < > 12.4* 13.4* 12.1* 14.4* 15.3*  NEUTROABS 7.3  --  8.5*  --   --   --   --   --   --  HGB 11.3*  < > 8.0*  < > 8.5* 8.1* 7.9* 7.9* 7.8*  HCT 36.3  < > 26.1*  < > 27.6* 26.4* 25.2* 25.5* 26.2*  MCV 90.5  < > 90.3  < > 90.5 92.0 92.0 93.4 94.6  PLT 203  < > 240  < > 328 276 262 281 307  < > = values in this interval not displayed.  CBG:  Recent Labs Lab 08/22/14 0556 08/22/14 1310 08/22/14 1750 08/22/14 2355 08/23/14 0608  GLUCAP 148* 156* 111* 136* 109*    Scheduled Meds:  Scheduled Meds: . antiseptic oral rinse  7 mL Mouth Rinse q12n4p  . budesonide (PULMICORT) nebulizer solution  0.25 mg Nebulization BID  . cefTAZidime (FORTAZ)  IV  2 g Intravenous Q T,Th,Sa-HD  . chlorhexidine  15 mL Mouth Rinse BID  . darbepoetin (ARANESP) injection - DIALYSIS  100 mcg Intravenous Q Thu-HD  . [START ON 08/24/2014] feeding supplement (VITAL AF 1.2 CAL)  1,000 mL Per Tube Q24H  . insulin aspart  0-9 Units Subcutaneous 4 times per day  . levothyroxine  12.5 mcg Intravenous Daily  . metoprolol  2.5 mg Intravenous 4 times per day  . metronidazole  500 mg Intravenous 3 times per day  . sodium chloride  10-40 mL Intracatheter Q12H     Tenea Sens [facility] (P) [phone]   If 7PM-7AM, please contact night-coverage [email or web address] Password [email or web address] 08/23/2014, 7:59 AM   LOS: 67 days

## 2014-08-23 NOTE — Progress Notes (Addendum)
[place] for TPN Indication:  High output Duodenal Fistula  Allergies  Allergen Reactions  . 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    Patient Measurements: Height: _0  (167.6 cm) Weight: 209 lb 9.6 oz (95.074 kg) IBW/kg (Calculated) : 59.3 Adjusted Body Weight: 71.2kg  Vital Signs: Temp: 96.5 F (35.8 C) (05/22 0717) Temp Source: Oral (05/22 0717) BP: 100/56 mmHg (05/22 0717) Pulse Rate: 100 (05/22 0717) Intake/Output from previous day: 05/21 0701 - 05/22 0700 In: 979.8 [I.V.:625; NG/GT:120; TPN:234.8] Out: 4030 [Drains:1700]  Labs:  Recent Labs  08/21/14 1527 08/22/14 0530 08/23/14 0415  WBC 12.1* 14.4* 15.3*  HGB 7.9* 7.9* 7.8*  HCT 25.2* 25.5* 26.2*  PLT 262 281 307    Recent Labs  08/21/14 0253 08/21/14 0825 08/21/14 1526 08/22/14 0530 08/23/14 0415  NA 132* 132* 136  --  137  K 4.2 4.1 4.1  --  4.3  CL 98* 96* 101  --  99*  CO2 _1 --  30  GLUCOSE 123* 138* 107*  --  120*  BUN 34* 39* 11  --  14  CREATININE 2.98* 3.20* 1.71*  --  2.43*  CALCIUM 7.8* 7.8* 7.7*  --  8.3*  MG 1.9  --   --  2.0 2.0  PHOS 3.6 3.9 2.1* 3.3 3.3  PROT 6.4*  --   --   --   --   ALBUMIN 1.6* 1.6* 2.0*  --   --   AST 25  --   --   --   --   ALT 9*  --   --   --   --   ALKPHOS 151*  --   --   --   --   BILITOT 0.8  --   --   --   --    Estimated Creatinine Clearance: 29.3 mL/min (by C-G formula based on Cr of 2.43).   Recent Labs  08/22/14 1750 08/22/14 2355 08/23/14 0608  GLUCAP 111* 136* 109*   Insulin Requirements in the past 24 hours:  1 unit Novolog SSI since 1800 last night  Current Nutrition:  Clinimix E5/15 at 44m/hr and 20% IVFE at 138mhr  Vital AF 1.2 at 2574mr  Nutritional Goals: per RD assessment 5/12 2100-2300 kCal, 120-140 grams of protein per day Goal: Clinimix 5/15 at 100m32m + 20% IVFE 10ml65mto provide 2184 kcal and 120 gm protein  Assessment: 58 YO38with complicated hospital course. S/p closure  perforated pyloric ulcer, SBR for ischemic necrosis, diffuse peritonitis on 07/01/14. Pharmacy consulted to provide TPN 4/1-4/15 for nutrition support for prolonged ileus. Pt then transitioned to po diet + supplements. 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  GI:DuWI:OMBTDHRCula. NPO.Open abd wound, (+)abd pain. Octreotide drip ordered 5/10 am. Albumin 1.7. Prealbumin remains low at 7.9. Tube feeds started now running to 20ml/83m 5/10 Add octreotide drip 5/11 IR placed gastro-jenjunal tube for post-pyloric feeding and drainage of gastric juices 5/12 to start Vital AF 1.2 trickle feedings via J tube at 10 ml/hr 5/13 Continues on trickle feeds at 10 ml/hr 5/14: Possible leak in GJ tube - TFs held. Imaging showed no change in JP position with tips in proper positions however pt still with pre-rectal fluid collection 5/15: TFs still being held. IR to replace drain for fluid collection 5/16: IR percutaneous placement of drain in pelvis with aspiration/drainage  5/19: Abscess drain accidentally pulled  Endo:Hx DM/hypothyroidism - CBGs well controlled on SSI alone, synthroid (new), TSH 7.3 Lytes: Lytes being added and removed to the TPN bag as needed based on labs. Na 137, K 4.3, Mg 2, Phos 3.3, CoCa 9.9. Per discussion with renal-pharmacy to manage K - seems to be tolerating electrolytes in TPN Renal: New ESRD - started 3/17. HD on TTS. NS kvo, got extra HD 5/20 as fluid has been difficult to manage - Renal requested that TPN be reduced to 25-60m/hr to assist with volume management Heme: Anemia of ESRD - H/H 7.8/26.2 plts 307 - aranesp Pulm: Hx asthma/COPD. 97% 2L [place]. CT negative for PE Cards: HTN, afib - IV metoprolol scheduled, [facility] 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  Hepatotobil: Alk phos elevated, other LFTs + Tbili WNL, TG 142 (5/16) Neuro: Hx depression / insomnia - new stroke noted on  3/18.  ID: Afebrile, WBC 15.3, s/p treatment for CDiff and s/p multiple courses of abx during admission. Hep B Ag negative. Flagyl resumed empirically on 5/17 when ceftaz was resumed. 5/14 wound cx (abdomen): pseudomonas   5/17 ceftazidime >> 5/17 flagyl >> Best Practices: SCDs, PPI IV TPN Access: PICC line in IR 4/27 TPN start date: 4/1 >> 4/15; restart 4/27>>  Plan:  - Continue Clinimix E5/15 at 347mhr (rate per renal MD request to limit fluids), continue IVFE at 1046mr - (Starting tonight TPN will provide 991 Kcal/day + 36gm protein and Vital AF at 48m24m provides 864 cal/day + 45gm protein/day. Total of 1855 kcal/day + 81gm protein/day >>68% protein goal, 88% kcal goal) - would expect that tube feeds could be titrated up further to provide further nutrition to better meet her needs - Continue MVI + TE in TPN - Continue sensitive SSI - Continue ascorbic acid 1gm in TPN - MD wants a total of 14 days (started in TPN 5/18) - will need to reorder when TPN is discontinued - F/u tolerance of tube feeds and ability to titrate up and discontinue TPN  RachSalome ArntarmD, BCPS Pager # [phone] 7:59 AM

## 2014-08-23 NOTE — Progress Notes (Signed)
Called into pts room by IV nurse. Pt stated she felt like her drain was leaking. Upon assessment I found that she had pulled on her drain. The stitches were out and the site was leaking greenish/yellow fluid. I tapped the drain to her abdomen to keep what was left of the drain inserted and notified the MD. Lorin PicketLindsey Kerisha Goughnour, RN

## 2014-08-23 NOTE — Progress Notes (Signed)
Pt started c/o CP, ST into the 140's, O2 sats 92-94% on 2l [place].  Notified MD. New orders rec'vd.  Rapid response notified for assistance.  X-ray tech in room; completed both abd xray and chest xray.

## 2014-08-23 NOTE — Progress Notes (Signed)
[facility] Progress Note  Assessment: 1. New ESRD TTS for now 2. SOB - prob due to vol excess, resolved w extra HD, reducing TNA; wt's down 3. Pelvic fluid s/p drain 5/16 - repeat CT 5/21 shows improved pelvic fluid collection 4. CVA acute b y MRI 3/19 5. Perf pyloric ulcer SP exlap, w duod fistula to suction high output on octreotide drip 6. Nutrition w G-J tube starting TF's (VItal)+trying to wean TNA- alb low - per pharm 7. C diff - s/p po Vanc - no diarrhea at present 8. Anemia Aranesp 100 q Thurs last 5/19IV Fe load- if Hgb does not trend up by next Thursday, need to ^ Aranesp 9. HD access needs revision of AVF when current issues resolved 10. 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 Parox SVT on IV MTP q 4hrs 12. 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 Pseudomonas wound culture 5/16 - on Fortaz/flagyl 14. Vitamin C deficiency - getting 1000 mg IV daily w TNA, repeat level ordered for Mon  Plan - next HD Tuesday, cont to lower vol as tolerated. Ask surg this week if octreotide gtt (1.2 L per day) can be stopped or changed to SQ/IM  Vinson Moselleob Boluwatife Mutchler MD pager [phone]    cell [phone] 08/23/2014, 1:11 PM    Subjective:   No new issues  Objective Filed Vitals:   08/23/14 0717 08/23/14 0934 08/23/14 1200 08/23/14 1212  BP: 100/56  125/76 102/60  Pulse: 100  105 106  Temp: 96.5 F (35.8 C)   97 F (36.1 C)  TempSrc: Oral   Axillary  Resp: 18  26 19   Height:      Weight:      SpO2: 97% 97% 95% 96%   Physical Exam General: sleeping  Heart:tachy reg Lungs:clear to A/P now Abdomen: obese, anasarca with dependent back/buttock edema Extremities: 1-2+ dependent edema Dialysis Access: right IJ Qb 400  Dialysis Orders:  Additional Objective Labs: Basic Metabolic Panel:  Recent Labs Lab 08/21/14 0825 08/21/14 1526 08/22/14 0530 08/23/14 0415  NA 132* 136  --  137  K 4.1 4.1  --  4.3  CL 96* 101  --  99*  CO2 27 29  --  30  GLUCOSE 138* 107*  --  120*   BUN 39* 11  --  14  CREATININE 3.20* 1.71*  --  2.43*  CALCIUM 7.8* 7.7*  --  8.3*  PHOS 3.9 2.1* 3.3 3.3   Liver Function Tests:  Recent Labs Lab 08/19/14 0730 08/20/14 0500 08/21/14 0253 08/21/14 0825 08/21/14 1526  AST 31 20 25   --   --   ALT 12* 8* 9*  --   --   ALKPHOS 156* 139* 151*  --   --   BILITOT 1.1 0.9 0.8  --   --   PROT 6.2* 6.2* 6.4*  --   --   ALBUMIN 1.6* 1.5* 1.6* 1.6* 2.0*  CBC:  Recent Labs Lab 08/17/14 0430  08/19/14 0730  08/21/14 0253 08/21/14 0824 08/21/14 1527 08/22/14 0530 08/23/14 0415  WBC 9.8  < > 11.7*  < > 12.4* 13.4* 12.1* 14.4* 15.3*  NEUTROABS 7.3  --  8.5*  --   --   --   --   --   --   HGB 11.3*  < > 8.0*  < > 8.5* 8.1* 7.9* 7.9* 7.8*  HCT 36.3  < > 26.1*  < > 27.6* 26.4* 25.2* 25.5* 26.2*  MCV 90.5  < >  90.3  < > 90.5 92.0 92.0 93.4 94.6  PLT 203  < > 240  < > 328 276 262 281 307  < > = values in this interval not displayed. Blood Culture    Component Value Date/Time   SDES ABSCESS PELVIS 08/17/2014 1451   SPECREQUEST NONE 08/17/2014 1451   CULT  08/17/2014 1451    NO GROWTH 3 DAYS Performed at [facility]    REPTSTATUS 08/21/2014 FINAL 08/17/2014 1451    Cardiac Enzymes: No results for input(s): CKTOTAL, CKMB, CKMBINDEX, TROPONINI in the last 168 hours. CBG:  Recent Labs Lab 08/22/14 1310 08/22/14 1750 08/22/14 2355 08/23/14 0608 08/23/14 1205  GLUCAP 156* 111* 136* 109* 124*    Medications: . Marland KitchenTPN (CLINIMIX-E) Adult 30 mL/hr at 08/22/14 1714   And  . fat emulsion 240 mL (08/22/14 1744)  . Marland KitchenTPN (CLINIMIX-E) Adult     And  . fat emulsion    . sodium chloride 10 mL/hr at 08/19/14 0950  . octreotide  (SANDOSTATIN)    IV infusion 50 mcg/hr (08/23/14 1155)   . antiseptic oral rinse  7 mL Mouth Rinse q12n4p  . budesonide (PULMICORT) nebulizer solution  0.25 mg Nebulization BID  . cefTAZidime (FORTAZ)  IV  2 g Intravenous Q T,Th,Sa-HD  . chlorhexidine  15 mL Mouth Rinse BID  . darbepoetin  (ARANESP) injection - DIALYSIS  100 mcg Intravenous Q Thu-HD  . [START ON 08/24/2014] feeding supplement (VITAL AF 1.2 CAL)  1,000 mL Per Tube Q24H  . insulin aspart  0-9 Units Subcutaneous 4 times per day  . levothyroxine  12.5 mcg Intravenous Daily  . metoprolol  2.5 mg Intravenous 4 times per day  . metronidazole  500 mg Intravenous 3 times per day  . sodium chloride  10-40 mL Intracatheter Q12H

## 2014-08-24 LAB — DIFFERENTIAL
Basophils Absolute: 0 10*3/uL (ref 0.0–0.1)
Basophils Relative: 0 % (ref 0–1)
Eosinophils Absolute: 0.5 10*3/uL (ref 0.0–0.7)
Eosinophils Relative: 3 % (ref 0–5)
LYMPHS ABS: 1.3 10*3/uL (ref 0.7–4.0)
LYMPHS PCT: 8 % — AB (ref 12–46)
Monocytes Absolute: 1.4 10*3/uL — ABNORMAL HIGH (ref 0.1–1.0)
Monocytes Relative: 9 % (ref 3–12)
Neutro Abs: 12.5 10*3/uL — ABNORMAL HIGH (ref 1.7–7.7)
Neutrophils Relative %: 80 % — ABNORMAL HIGH (ref 43–77)

## 2014-08-24 LAB — TROPONIN I
TROPONIN I: 0.03 ng/mL (ref <0.031)
Troponin I: 0.03 ng/mL (ref <0.031)

## 2014-08-24 LAB — CBC
HEMATOCRIT: 25 % — AB (ref 36.0–46.0)
Hemoglobin: 7.6 g/dL — ABNORMAL LOW (ref 12.0–15.0)
MCH: 29.2 pg (ref 26.0–34.0)
MCHC: 30.4 g/dL (ref 30.0–36.0)
MCV: 96.2 fL (ref 78.0–100.0)
Platelets: 309 10*3/uL (ref 150–400)
RBC: 2.6 MIL/uL — ABNORMAL LOW (ref 3.87–5.11)
RDW: 21.5 % — ABNORMAL HIGH (ref 11.5–15.5)
WBC: 15.7 10*3/uL — ABNORMAL HIGH (ref 4.0–10.5)

## 2014-08-24 LAB — GLUCOSE, CAPILLARY
GLUCOSE-CAPILLARY: 115 mg/dL — AB (ref 65–99)
Glucose-Capillary: 136 mg/dL — ABNORMAL HIGH (ref 65–99)
Glucose-Capillary: 139 mg/dL — ABNORMAL HIGH (ref 65–99)

## 2014-08-24 LAB — PREALBUMIN: Prealbumin: 9.7 mg/dL — ABNORMAL LOW (ref 18–38)

## 2014-08-24 LAB — PHOSPHORUS: Phosphorus: 5 mg/dL — ABNORMAL HIGH (ref 2.5–4.6)

## 2014-08-24 LAB — MAGNESIUM: MAGNESIUM: 2 mg/dL (ref 1.7–2.4)

## 2014-08-24 LAB — TRIGLYCERIDES: TRIGLYCERIDES: 131 mg/dL (ref <150)

## 2014-08-24 MED ORDER — TRACE MINERALS CR-CU-F-FE-I-MN-MO-SE-ZN IV SOLN
INTRAVENOUS | Status: AC
  Administered 2014-08-24: 17:00:00 via INTRAVENOUS
  Filled 2014-08-24: qty 720

## 2014-08-24 MED ORDER — VITAL AF 1.2 CAL PO LIQD
1000.0000 mL | ORAL | Status: DC
  Administered 2014-08-24: 1000 mL
  Filled 2014-08-24 (×3): qty 1000

## 2014-08-24 MED ORDER — INSULIN ASPART 100 UNIT/ML ~~LOC~~ SOLN
0.0000 [IU] | Freq: Three times a day (TID) | SUBCUTANEOUS | Status: DC
  Administered 2014-08-24 – 2014-08-25 (×2): 1 [IU] via SUBCUTANEOUS

## 2014-08-24 MED ORDER — FAT EMULSION 20 % IV EMUL
240.0000 mL | INTRAVENOUS | Status: AC
Start: 2014-08-24 — End: 2014-08-25
  Administered 2014-08-24: 240 mL via INTRAVENOUS
  Filled 2014-08-24: qty 250

## 2014-08-24 NOTE — Progress Notes (Signed)
   Will stop octreotide.  [name], ANP-BC

## 2014-08-24 NOTE — Progress Notes (Addendum)
INFECTIOUS DISEASE PROGRESS NOTE  ID: [name] is a 59 y.o. female with  Principal Problem:   Acute respiratory failure with hypoxia Active Problems:   Cerebral thrombosis with cerebral infarction   Hypertension   ESRD needing dialysis   Obesity (BMI 30-39.9)   Depression   Left renal mass   Chronic diastolic heart failure   H1N1 influenza   Tobacco use disorder   Renal failure (ARF), acute on chronic   Hyperlipidemia   PSVT (paroxysmal supraventricular tachycardia)   SOB (shortness of breath)   Perforated gastric ulcer   Abnormal TSH   History of intermediate V/Q scan   HIT (heparin-induced thrombocytopenia)   Fistula   Abdominal pain   Palliative care encounter   ESRD (end stage renal disease)   SVT (supraventricular tachycardia)   Right heart failure   Duodenal anastomotic leak   Protein calorie malnutrition   Itching   Aortic aneurysm   Protein-calorie malnutrition  Subjective: Without complaints.   Abtx:  Anti-infectives    Start     Dose/Rate Route Frequency Ordered Stop   08/21/14 1430  cefTAZidime (FORTAZ) 1 g in dextrose 5 % 50 mL IVPB     1 g 100 mL/hr over 30 Minutes Intravenous  Once 08/21/14 1426 08/21/14 1547   08/18/14 1300  metroNIDAZOLE (FLAGYL) IVPB 500 mg     500 mg 100 mL/hr over 60 Minutes Intravenous 3 times per day 08/18/14 1045     08/18/14 1200  cefTAZidime (FORTAZ) 2 g in dextrose 5 % 50 mL IVPB     2 g 100 mL/hr over 30 Minutes Intravenous Every T-Th-Sa (Hemodialysis) 08/18/14 1054     08/12/14 1449  ceFAZolin (ANCEF) 2-3 GM-% IVPB SOLR    Comments:  Troy Sine   : cabinet override      08/12/14 1449 08/12/14 1629   08/12/14 1130  ceFAZolin (ANCEF) IVPB 2 g/50 mL premix    Comments:  Hang ON CALL to xray 5/11   2 g 100 mL/hr over 30 Minutes Intravenous To Radiology 08/12/14 1035 08/12/14 1520   08/04/14 1200  vancomycin (VANCOCIN) 50 mg/mL oral solution 125 mg  Status:  Discontinued     125 mg Oral 4 times per day  08/04/14 0851 08/08/14 1229   07/18/14 1800  vancomycin (VANCOCIN) 50 mg/mL oral solution 125 mg  Status:  Discontinued     125 mg Oral 4 times per day 07/18/14 1455 08/04/14 0845   07/17/14 1830  vancomycin (VANCOCIN) IVPB 750 mg/150 ml premix     750 mg 150 mL/hr over 60 Minutes Intravenous  Once 07/17/14 1818 07/18/14 0024   07/15/14 1400  metroNIDAZOLE (FLAGYL) tablet 500 mg  Status:  Discontinued     500 mg Oral 3 times per day 07/15/14 1202 07/18/14 1533   07/13/14 2200  piperacillin-tazobactam (ZOSYN) IVPB 2.25 g  Status:  Discontinued     2.25 g 100 mL/hr over 30 Minutes Intravenous 3 times per day 07/13/14 1318 07/24/14 1138   07/13/14 2000  fluconazole (DIFLUCAN) IVPB 200 mg  Status:  Discontinued     200 mg 100 mL/hr over 60 Minutes Intravenous Every 24 hours 07/13/14 1318 07/19/14 1027   07/07/14 1200  vancomycin (VANCOCIN) IVPB 1000 mg/200 mL premix  Status:  Discontinued     1,000 mg 200 mL/hr over 60 Minutes Intravenous Every 24 hours 07/07/14 0937 07/14/14 1712   07/06/14 2000  fluconazole (DIFLUCAN) IVPB 400 mg  Status:  Discontinued  400 mg 100 mL/hr over 120 Minutes Intravenous Every 24 hours 07/06/14 1507 07/13/14 1318   07/06/14 1800  piperacillin-tazobactam (ZOSYN) IVPB 2.25 g  Status:  Discontinued     2.25 g 100 mL/hr over 30 Minutes Intravenous 4 times per day 07/06/14 1505 07/13/14 1318   07/05/14 2000  fluconazole (DIFLUCAN) IVPB 200 mg  Status:  Discontinued     200 mg 100 mL/hr over 60 Minutes Intravenous Every 24 hours 07/05/14 0757 07/06/14 1507   07/05/14 1400  piperacillin-tazobactam (ZOSYN) IVPB 2.25 g  Status:  Discontinued     2.25 g 100 mL/hr over 30 Minutes Intravenous 3 times per day 07/05/14 0749 07/06/14 1505   07/02/14 1800  vancomycin (VANCOCIN) IVPB 1000 mg/200 mL premix  Status:  Discontinued     1,000 mg 200 mL/hr over 60 Minutes Intravenous Every 24 hours 07/01/14 1858 07/05/14 0801   07/01/14 2200  piperacillin-tazobactam (ZOSYN) IVPB  3.375 g  Status:  Discontinued     3.375 g 100 mL/hr over 30 Minutes Intravenous 4 times per day 07/01/14 1858 07/05/14 0749   07/01/14 2000  fluconazole (DIFLUCAN) IVPB 400 mg  Status:  Discontinued     400 mg 100 mL/hr over 120 Minutes Intravenous Every 24 hours 07/01/14 1858 07/05/14 0757   07/01/14 1400  fluconazole (DIFLUCAN) IVPB 200 mg  Status:  Discontinued     200 mg 100 mL/hr over 60 Minutes Intravenous Every 24 hours 07/01/14 1330 07/01/14 1853   07/01/14 1000  piperacillin-tazobactam (ZOSYN) IVPB 2.25 g  Status:  Discontinued     2.25 g 100 mL/hr over 30 Minutes Intravenous Every 8 hours 07/01/14 0952 07/01/14 1853   07/01/14 1000  vancomycin (VANCOCIN) 500 mg in sodium chloride 0.9 % 100 mL IVPB     500 mg 100 mL/hr over 60 Minutes Intravenous  Once 07/01/14 0952 07/01/14 1126   06/30/14 1200  vancomycin (VANCOCIN) IVPB 1000 mg/200 mL premix     1,000 mg 200 mL/hr over 60 Minutes Intravenous  Once 06/30/14 0944 06/30/14 1403   06/30/14 1200  ceFEPIme (MAXIPIME) 2 g in dextrose 5 % 50 mL IVPB     2 g 100 mL/hr over 30 Minutes Intravenous  Once 06/30/14 0944 06/30/14 1425   06/30/14 0100  vancomycin (VANCOCIN) 2,000 mg in sodium chloride 0.9 % 500 mL IVPB     2,000 mg 250 mL/hr over 120 Minutes Intravenous  Once 06/30/14 0048 06/30/14 0525   06/30/14 0100  ceFEPIme (MAXIPIME) 2 g in dextrose 5 % 50 mL IVPB     2 g 100 mL/hr over 30 Minutes Intravenous  Once 06/30/14 0048 06/30/14 0322   06/27/14 0600  cefUROXime (ZINACEF) 1.5 g in dextrose 5 % 50 mL IVPB     1.5 g 100 mL/hr over 30 Minutes Intravenous On call to O.R. 06/26/14 1019 06/27/14 0630   06/19/14 1800  oseltamivir (TAMIFLU) capsule 30 mg     30 mg Oral Every M-W-F (1800) 06/19/14 1003 06/22/14 1841   06/18/14 1600  oseltamivir (TAMIFLU) capsule 30 mg  Status:  Discontinued     30 mg Oral Daily 06/18/14 1538 06/19/14 1003      Medications:  Scheduled: . antiseptic oral rinse  7 mL Mouth Rinse q12n4p  .  budesonide (PULMICORT) nebulizer solution  0.25 mg Nebulization BID  . cefTAZidime (FORTAZ)  IV  2 g Intravenous Q T,Th,Sa-HD  . chlorhexidine  15 mL Mouth Rinse BID  . darbepoetin (ARANESP) injection - DIALYSIS  100 mcg  Intravenous Q Thu-HD  . feeding supplement (VITAL AF 1.2 CAL)  1,000 mL Per Tube Q24H  . insulin aspart  0-9 Units Subcutaneous Q8H  . levothyroxine  12.5 mcg Intravenous Daily  . metoprolol  2.5 mg Intravenous 4 times per day  . metronidazole  500 mg Intravenous 3 times per day  . sodium chloride  10-40 mL Intracatheter Q12H    Objective: Vital signs in last 24 hours: Temp:  [97 F (36.1 C)-97.9 F (36.6 C)] 97.9 F (36.6 C) (05/23 0728) Pulse Rate:  [103-110] 103 (05/23 0728) Resp:  [13-26] 19 (05/23 0728) BP: (90-129)/(58-76) 103/69 mmHg (05/23 0728) SpO2:  [85 %-98 %] 98 % (05/23 0728) FiO2 (%):  [28 %] 28 % (05/22 0934) Weight:  [96.5 kg (212 lb 11.9 oz)] 96.5 kg (212 lb 11.9 oz) (05/23 0428)   General appearance: alert and no distress Resp: clear to auscultation bilaterally Cardio: regular rate and rhythm GI: normal findings: bowel sounds normal and soft, non-tender  Lab Results  Recent Labs  08/21/14 1526  08/23/14 0415 08/24/14 0520  WBC  --   < > 15.3* 15.7*  HGB  --   < > 7.8* 7.6*  HCT  --   < > 26.2* 25.0*  NA 136  --  137  --   K 4.1  --  4.3  --   CL 101  --  99*  --   CO2 29  --  30  --   BUN 11  --  14  --   CREATININE 1.71*  --  2.43*  --   < > = values in this interval not displayed. Liver Panel  Recent Labs  08/21/14 1526  ALBUMIN 2.0*   Sedimentation Rate No results for input(s): ESRSEDRATE in the last 72 hours. C-Reactive Protein No results for input(s): CRP in the last 72 hours.  Microbiology: Recent Results (from the past 240 hour(s))  Wound culture     Status: None   Collection Time: 08/15/14  6:27 AM  Result Value Ref Range Status   Specimen Description WOUND  Final   Special Requests ABDOMIN  Final   Gram  Stain   Final    MODERATE WBC PRESENT, PREDOMINANTLY PMN NO SQUAMOUS EPITHELIAL CELLS SEEN FEW GRAM NEGATIVE RODS Performed at [facility]    Culture   Final    ABUNDANT PSEUDOMONAS AERUGINOSA Performed at [facility]    Report Status 08/18/2014 FINAL  Final   Organism ID, Bacteria PSEUDOMONAS AERUGINOSA  Final      Susceptibility   Pseudomonas aeruginosa - MIC*    CEFEPIME 16 INTERMEDIATE Intermediate     CEFTAZIDIME 4 SENSITIVE Sensitive     CIPROFLOXACIN <=0.25 SENSITIVE Sensitive     GENTAMICIN 2 SENSITIVE Sensitive     IMIPENEM 1 SENSITIVE Sensitive     PIP/TAZO 16 SENSITIVE Sensitive     TOBRAMYCIN <=1 SENSITIVE Sensitive     * ABUNDANT PSEUDOMONAS AERUGINOSA  Culture, routine-abscess     Status: None   Collection Time: 08/17/14  2:51 PM  Result Value Ref Range Status   Specimen Description ABSCESS PELVIS  Final   Special Requests NONE  Final   Gram Stain   Final    FEW WBC PRESENT,BOTH PMN AND MONONUCLEAR NO SQUAMOUS EPITHELIAL CELLS SEEN NO ORGANISMS SEEN Performed at [facility]    Culture   Final    NO GROWTH 3 DAYS Performed at [facility]    Report Status 08/21/2014  FINAL  Final    Studies/Results: Dg Chest Port 1 View  08/23/2014   CLINICAL DATA:  Left chest pain  EXAM: PORTABLE CHEST - 1 VIEW  COMPARISON:  08/18/2014  FINDINGS: Patient is rotated.  Lungs are essentially clear. No focal consolidation. No pleural effusion or pneumothorax.  Cardiomegaly.  Left IJ venous catheter terminates at the cavoatrial junction. Right IJ dual lumen dialysis catheter terminates in the lower SVC.  IMPRESSION: No evidence of acute cardiopulmonary disease.   Electronically Signed   By: Charline Bills M.D.   On: 08/23/2014 19:17   Dg Abd Portable 1v  08/23/2014   CLINICAL DATA:  JP drain, broken, initial encounter  EXAM: PORTABLE ABDOMEN - 1 VIEW  COMPARISON:  CT abdomen pelvis dated 08/21/2014  FINDINGS: A portion of the right flank is  excluded from this single image.  The patient's right abdominal surgical drain is not visualized.  Gastrojejunostomy in satisfactory position.  IMPRESSION: The patient's right abdominal surgical drain is not visualized.  Gastrojejunostomy in satisfactory position.   Electronically Signed   By: Charline Bills M.D.   On: 08/23/2014 19:11     Assessment/Plan: Wound infection Abscess 5-14 pseudomonas Perforated pyloric ulcer Ischemic necrosis ileum COPD CKD, ESRD on HD HTN Protein calorie malnutrition, severe C diff (4-12)  +HIT  Total days of antibiotics: day 7 ceftaz/flagyl  No change in anbx Her drain is out  Consider repeat CT if WBC remains up,          Johny Sax Infectious Diseases (pager) [phone] [email or web address] 08/24/2014, 9:20 AM  LOS: 68 days

## 2014-08-24 NOTE — Progress Notes (Signed)
Physical Therapy Treatment Patient Details Name: [name] MRN: [id number] DOB: 02/21/1956 Today's Date: 08/24/2014    History of Present Illness [name] is a 59 y.o. female with a history of CKD and hypertension came to the [facility] ED on 3/16 complaining of shortness of breath and generalized weakness x 2 weeks. Admitted with acute hypoxemic respiratory failure. During hospital stay noted to have weakness of her left side. 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  She developed septic shock 4/1 with pressors weaned off 4/3.  Began CRRT and pressors restarted 4/4; Developed pelvic abcess 4/7 with pelvic drain placed 4/8.  weaned off pressors 4/9.  Runs of SVT 4/5 and 4/11. Prolonged hospital course with hypoxemia and hypotension    PT Comments    Pt with decreased motivation to ambulate secondary to fear of falling and benefits from encouragement, reinforcement of safety and increased gait visual goals. Pt moving well and benefits from being pushed to progress. Will continue to follow with frequency increased and goals updated. Encouraged OOB daily and mobility with nursing.   Follow Up Recommendations  SNF     Equipment Recommendations       Recommendations for Other Services       Precautions / Restrictions Precautions Precautions: Fall Precaution Comments: 2 abdominal drains to foley bags, G tube Restrictions Weight Bearing Restrictions: No    Mobility  Bed Mobility Overal bed mobility: Needs Assistance Bed Mobility: Supine to Sit     Supine to sit: Min assist;HOB elevated     General bed mobility comments: cues for sequence, min assist to bring LLE to EOB and assist to scoot fully to EOB with use of pad  Transfers Overall transfer level: Needs assistance   Transfers: Sit to/from Stand Sit to Stand: Min assist         General  transfer comment: cues for hand placement, sequence and powering up with min assist to achieve from bed and recliner  Ambulation/Gait Ambulation/Gait assistance: Min assist;+2 safety/equipment Ambulation Distance (Feet): 26 Feet Assistive device: Rolling walker (2 wheeled) Gait Pattern/deviations: Step-to pattern;Decreased stride length;Narrow base of support   Gait velocity interpretation: Below normal speed for age/gender General Gait Details: 10', 4' with seated rest. Pt with use of RW with maintained internal rotation of LLE with cues for increased BOS, external rotation of Left hip, safety and position in RW. Chair pulled behind for safety. Pt benefits from visual goals and encourgement to maximize gait   Stairs            Wheelchair Mobility    Modified Rankin (Stroke Patients Only)       Balance Overall balance assessment: Needs assistance   Sitting balance-Leahy Scale: Good       Standing balance-Leahy Scale: Poor                      Cognition Arousal/Alertness: Awake/alert Behavior During Therapy: Flat affect Overall Cognitive Status: Impaired/Different from baseline Area of Impairment: Orientation Orientation Level: Disoriented to;Time Current Attention Level: Selective                Exercises      General Comments        Pertinent Vitals/Pain Pain Score: 3  Pain Location: abdomen  HR 105 sats 92-95% on RA throughout BP 107/62 after activity    Home Living  Prior Function            PT Goals (current goals can now be found in the care plan section) Acute Rehab PT Goals Patient Stated Goal: to get back to normal Time For Goal Achievement: 09/07/14 Potential to Achieve Goals: Good Progress towards PT goals: Goals met and updated - see care plan    Frequency  Min 3X/week    PT Plan Frequency needs to be updated    Co-evaluation             End of Session Equipment Utilized During  Treatment: Gait belt Activity Tolerance: Patient tolerated treatment well Patient left: in chair;with call bell/phone within reach;with nursing/sitter in room     Time: 0838-0909 PT Time Calculation (min) (ACUTE ONLY): 31 min  Charges:  $Gait Training: 23-37 mins                    G Codes:      Melford Aase 2014-09-19, 11:33 AM Elwyn Reach, [place]

## 2014-08-24 NOTE — Progress Notes (Addendum)
ANTIBIOTIC CONSULT NOTE - FOLLOW UP  Pharmacy Consult:  Elita QuickFortaz Indication:  Wound infection  Allergies  Allergen Reactions  . 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    Patient Measurements: Height: 5\' 6"  (167.6 cm) Weight: 212 lb 11.9 oz (96.5 kg) IBW/kg (Calculated) : 59.3  Vital Signs: Temp: 97.2 F (36.2 C) (05/23 1134) Temp Source: Oral (05/23 1134) BP: 95/60 mmHg (05/23 1156) Pulse Rate: 101 (05/23 1156) Intake/Output from previous day: 05/22 0701 - 05/23 0700 In: 2150 [I.V.:610; NG/GT:480; IV Piggyback:100; TPN:960] Out: 500 [Drains:500] Intake/Output from this shift: Total I/O In: 509.8 [I.V.:175; NG/GT:134.8; TPN:200] Out: 800 [Drains:800]  Labs:  Recent Labs  08/21/14 1526  08/22/14 0530 08/23/14 0415 08/24/14 0520  WBC  --   < > 14.4* 15.3* 15.7*  HGB  --   < > 7.9* 7.8* 7.6*  PLT  --   < > 281 307 309  CREATININE 1.71*  --   --  2.43*  --   < > = values in this interval not displayed. Estimated Creatinine Clearance: 29.6 mL/min (by C-G formula based on Cr of 2.43). No results for input(s): VANCOTROUGH, VANCOPEAK, VANCORANDOM, GENTTROUGH, GENTPEAK, GENTRANDOM, TOBRATROUGH, TOBRAPEAK, TOBRARND, AMIKACINPEAK, AMIKACINTROU, AMIKACIN in the last 72 hours.   Microbiology: Recent Results (from the past 720 hour(s))  Culture, blood (routine x 2)     Status: None   Collection Time: 08/03/14  6:25 PM  Result Value Ref Range Status   Specimen Description BLOOD RIGHT HAND  Final   Special Requests   Final    BOTTLES DRAWN AEROBIC AND ANAEROBIC 5CC BLUE,2CC RED   Culture   Final    NO GROWTH 5 DAYS Performed at [facility]    Report Status 08/10/2014 FINAL  Final  Culture, blood (routine x 2)     Status: None   Collection Time: 08/03/14  6:33 PM  Result Value Ref Range Status   Specimen Description BLOOD RIGHT ARM  Final   Special Requests   Final    BOTTLES DRAWN AEROBIC AND ANAEROBIC 5CC BLUE 2CC PURPLE   Culture   Final    NO GROWTH 5 DAYS Performed at  [facility]    Report Status 08/10/2014 FINAL  Final  MRSA PCR Screening     Status: None   Collection Time: 08/06/14  6:52 PM  Result Value Ref Range Status   MRSA by PCR NEGATIVE NEGATIVE Final    Comment:        The GeneXpert MRSA Assay (FDA approved for NASAL specimens only), is one component of a comprehensive MRSA colonization surveillance program. It is not intended to diagnose MRSA infection nor to guide or monitor treatment for MRSA infections.   Wound culture     Status: None   Collection Time: 08/15/14  6:27 AM  Result Value Ref Range Status   Specimen Description WOUND  Final   Special Requests ABDOMIN  Final   Gram Stain   Final    MODERATE WBC PRESENT, PREDOMINANTLY PMN NO SQUAMOUS EPITHELIAL CELLS SEEN FEW GRAM NEGATIVE RODS Performed at [facility]    Culture   Final    ABUNDANT PSEUDOMONAS AERUGINOSA Performed at [facility]    Report Status 08/18/2014 FINAL  Final   Organism ID, Bacteria PSEUDOMONAS AERUGINOSA  Final      Susceptibility   Pseudomonas aeruginosa - MIC*    CEFEPIME 16 INTERMEDIATE Intermediate     CEFTAZIDIME 4 SENSITIVE Sensitive     CIPROFLOXACIN <=  0.25 SENSITIVE Sensitive     GENTAMICIN 2 SENSITIVE Sensitive     IMIPENEM 1 SENSITIVE Sensitive     PIP/TAZO 16 SENSITIVE Sensitive     TOBRAMYCIN <=1 SENSITIVE Sensitive     * ABUNDANT PSEUDOMONAS AERUGINOSA  Culture, routine-abscess     Status: None   Collection Time: 08/17/14  2:51 PM  Result Value Ref Range Status   Specimen Description ABSCESS PELVIS  Final   Special Requests NONE  Final   Gram Stain   Final    FEW WBC PRESENT,BOTH PMN AND MONONUCLEAR NO SQUAMOUS EPITHELIAL CELLS SEEN NO ORGANISMS SEEN Performed at [facility]    Culture   Final    NO GROWTH 3 DAYS Performed at [facility]    Report Status 08/21/2014 FINAL  Final      Assessment: 12 YOF with complicated hospital course to continue on Fortaz for Pseudomonas  growing in wound culture and Flagyl for abdominal coverage.  Patient is new to HD and is currently on TTS schedule.  Elita Quick 5/17 >> Flagyl 5/17 >>  5/2 BCx x2 - negative 5/14 abd wound - Pseudomonas    5/16 pelvis abscess - negative   Goal of Therapy:  Resolution of infection   Plan:  - Continue Fortaz 2gm IV q-HD TTS - Continue Flagyl  IV Q8H per MD - Pharmacy will sign off of Fortaz dosing as dosage adjustment unnecessary for dialysis.  LOT per ID.    Naod Sweetland D. Laney Potash, PharmD, BCPS Pager:  [phone] 08/24/2014, 1:26 PM

## 2014-08-24 NOTE — Progress Notes (Signed)
[name] for TPN Indication:  High output duodenal Fistula  Allergies  Allergen Reactions  . 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    Patient Measurements: Height: 5\' 6"  (167.6 cm) Weight: 212 lb 11.9 oz (96.5 kg) IBW/kg (Calculated) : 59.3 Adjusted Body Weight: 71.2kg  Vital Signs: Temp: 97.9 F (36.6 C) (05/23 0728) Temp Source: Oral (05/23 0728) BP: 103/69 mmHg (05/23 0728) Pulse Rate: 103 (05/23 0728) Intake/Output from previous day: 05/22 0701 - 05/23 0700 In: 2150 [I.V.:610; NG/GT:480; IV Piggyback:100; TPN:960] Out: 500 [Drains:500]  Labs:  Recent Labs  08/22/14 0530 08/23/14 0415 08/24/14 0520  WBC 14.4* 15.3* 15.7*  HGB 7.9* 7.8* 7.6*  HCT 25.5* 26.2* 25.0*  PLT 281 307 309    Recent Labs  08/21/14 0825 08/21/14 1526 08/22/14 0530 08/23/14 0415 08/24/14 0520  NA 132* 136  --  137  --   K 4.1 4.1  --  4.3  --   CL 96* 101  --  99*  --   CO2 27 29  --  30  --   GLUCOSE 138* 107*  --  120*  --   BUN 39* 11  --  14  --   CREATININE 3.20* 1.71*  --  2.43*  --   CALCIUM 7.8* 7.7*  --  8.3*  --   MG  --   --  2.0 2.0 2.0  PHOS 3.9 2.1* 3.3 3.3 5.0*  ALBUMIN 1.6* 2.0*  --   --   --   PREALBUMIN  --   --   --   --  9.7*  TRIG  --   --   --   --  131   Estimated Creatinine Clearance: 29.6 mL/min (by C-G formula based on Cr of 2.43).   Recent Labs  08/23/14 1712 08/24/14 0012 08/24/14 0605  GLUCAP 103* 115* 136*   Insulin Requirements in the past 24 hours:  2 units Novolog SSI since 1800 last night  Current Nutrition:  Clinimix E 5/15 at 29ml/hr and 20% IVFE at 67ml/hr provides 991 kcal and 31 gm protein per day Vital AF 1.2 at 48ml/hr provides 720 kcal and 45 gm protein TF+ TPN = 1 kcal/day + 81gm protein/day  Nutritional Goals: per RD assessment 5/18 2100-2300 kCal, 120-140 grams of protein per day  Assessment: 56 YOF with complicated hospital course. S/p closure perforated pyloric ulcer, SBR for ischemic  necrosis, diffuse peritonitis on 07/01/14. Pharmacy consulted to provide TPN 4/1-4/15 for nutrition support for prolonged ileus. Pt then transitioned to po diet + supplements. 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  [name]. NPO.Open abd wound, (+)abd pain.Continues on octreotide drip since 5/10 - MD to eval today whether still needed. Prealbumin remains low at 9.7 but trending up. Pt tolerating tube feeds at 15ml/hr with no residuals . Plan to increase to 65ml/hr today. Drain o/p 586ml/24h.  Vit C level 0.2 (5/11) - adding 1gm extra to TPN since 5/18 (plan for 14 days total). Repeat Vit C level 5/23 - pending.  Endo:Hx DM/hypothyroidism - CBGs well controlled on SSI alone, synthroid (new), TSH 7.3  Lytes: Lytes being added and removed in the TPN bag as needed based on labs. No BMET this a.m. Phos up to 5, Mg 2. Per discussion with renal-pharmacy to manage K - seems to be tolerating electrolytes in TPN.  Renal: New ESRD - started 3/17. HD on TTS. NS kvo, got extra HD 5/20  as fluid has been difficult to manage. Renal requested that TPN be reduced to 25-36mL/hr to assist with volume management  Heme: Anemia of ESRD - H/H 7.8/26.2 plts 307 - aranesp  Pulm: Hx asthma/COPD. 2L [place]. CT negative for PE  Cards: HTN, afib - IV metoprolol scheduled, CHADS-VASc =4 (not good candidate for [facility]). 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   Hepatotobil: Alk phos elevated, other LFTs + Tbili WNL, TG wnl  Neuro: Hx depression / insomnia - new stroke noted on 3/18.   ID: Afebrile, WBC 15.7, s/p treatment for CDiff and s/p multiple courses of abx during admission. Hep B Ag negative. Currently on Flagyl and Ceftaz for pseudomonal wound infection.  5/14 wound cx (abdomen): pseudomonas   5/17 ceftazidime >> 5/17 flagyl >>  Best Practices: SCDs, PPI IV  TPN Access: PICC line in IR 4/27  TPN start date: 4/1 >> 4/15; restart 4/27>>  Plan:  -  Continue Clinimix E5/15 at 85ml/hr (rate per renal MD request to limit fluids) and 20% IVFE at 69ml/hr. (TPN will provide 991 Kcal/day + 36gm protein). Vital AF at 93ml/hr will provide 864 cal/day + 54gm protein/day. Total of 1855 kcal/day + 90 gm protein/day which meets 75% protein goal and 88% kcal goal. - Continue MVI + TE in TPN - Change sensitive SSI to q8h - Continue ascorbic acid 1gm in TPN - MD wants a total of 14 days (started in TPN 5/18) - will need to reorder when TPN is discontinued - F/u tolerance of tube feeds and ability to continue to titrate up to goal and discontinue TPN - F/u octreotide length of therapy  Sherlon Handing, PharmD, BCPS Clinical pharmacist, pager [phone]  08/24/2014 8:20 AM

## 2014-08-24 NOTE — Progress Notes (Addendum)
[employer] WGN:[id number] DOB: [birth year] DOA: 06/17/2014 PCP: Willey BladeEAN, [name], MD   Brief Narrative: 59 yo ? smoker with hx HTN, COPD, CKD IV followed @ [facility]-Dr. [name], failed L AV fistula, initially admitted 3/16 with flu and acute hypoxic resp failure. Ultimately tx to [facility] due to need for HD. L sided weakness 3/18 and found to have R ACA stroke-showed acute infarct R ACA. Patient more lethargic 3/30 and CT abd=perf viscus 3/31. She was taken to the OR for ex lap for perforated pyloric ulcer with diffuse peritonitis, ischemic necrosis of mid ileum. Patient developed septic shock on 4-1, she was started on pressors and wean off pressors on 4-3. Subsequently she was diagnosed with pelvic abscess by CT scan perform on 4-7. She underwent pelvic drain placement by IR. C. diff diagnosed 4-12-initially unresponsive to flagyl, and she was started on vancomycin 4-16. Develops  run of SVT. Cardiology was helping with management. Course complicated with high RV pressure, CT angio negative for PE.  Also with duodenal fistula, making her back to NPO state and in need of TPN. Palliative care consulted. On 5/2 she developed hypoxemia, hypotension and SVT during dialysis treatment. Dialysis was stopped. She received IV bolus. She was started on BIPAP. She was transferred to Step down unit. PCCM and cardiology re consulted. Her Hr decreased, BP improved. She was taken off BIPAP. This recurred on 5/15 with episodic SVT.  Now stable, on TNA + very high output from fistula/leak per [facility]. Had a G_J tube placed by Dr. [name] of IR and Gen surgery has continued to assist with management of her multiple surgical comorbidities. CT repeat 5/14 showed 5 cm abscess-transgluteal drain with 5 ml of purulent fluid removed on 5/17.  Gluteal drain was inadvertently pulled out on May 19. Repeat CT scan shows improvement in the fluid collection.  Her JP drain was pulled out inadvertently on May 22. It was replaced by surgery  on May 23.  CIR is interested in taking her if she can stabilize enough from her medical conditions  Subjective: Patient experienced some chest pain overnight which has now resolved. This was around the same time that the JP drain got pulled out. Denies any abdominal pain currently. Breathing is much better.  Assessment/Plan:  Status post exploratory laparotomy with closure of perforated pyloric ulcer/abdominal abscess / small bowel resection w/ postop ileus and now with fistula. Pelvic abscess s/p percutaneous drain placement 4/8; had leakage from anastomosis site. Received Zosyn for 23 days. Chronic low grade leukocytosis range 13-15 -IV antibiotics were discontinued by Surgery on 4/22. Also received 18 days of Vancomycin and fluconazole.  -Due to Fistula is back to NPO state, TPN. Trickle feeds being attempted. -Fistula study done shows JP drain connection to the duodenal bulb-5-02. - repeat CT abdomen with  3.0 x 3.5 x 5.7 cm collection in the pelvic cul-de-sac and extraluminal air alongside JP drain in anterior abd wall -Drain with old and new blood, Hb trending down, transfuse 2 unit PRBC 5/10 -continues to have high output from Fistula/leak. Had G_J placed by IR 08/12/14 at request of gen surgery -Patient continues to be on octreotide, which is being managed by surgery.  -CT repeat shows 5 cm post cul-de-sac abscess which was drained on 5/16-culture pending  -ID was consulted as patient had Pseudomonas growing from her culture wounds and they started back Flagyl as well as Elita QuickFortaz on 5/16 and are following and we await final growth from her new abscess drained on 5/16. -  Continue with tube feedings. Seems to be tolerating. TPN rate had to be reduced due to fluid overload. -Patient inadvertently pulled out her gluteal drain. Seen by interventional radiology and they recommended repeat CT scan, which was done. Shows that the fluid collection has decreased in size. They do not plan to replace  the drain at this time. Continue with antibiotics alone.  -CT scan did suggest a transmural defect in the stomach wall near the patient's pyloric Cheree Ditto patch. We will let surgery address this. -Patient accidentally pulled out her JP drain on 5/22. This has been replaced by surgery on 5/23. -Octreotide was discontinued 5/23  PSVT  -Felt to be secondary to severe anxiety with dialysis   -Has been evaluated by EP.  -Cardiology following peripherally- follow K+ and Mg  -Continue IV metoprolol, now off amiodarone;  -V-Q scan with intermediate probability for pulmonary embolism.  -CTA 4/27, negative for PE   -Heart rate better controlled with scheduled intravenous metoprolol. Monitor blood pressures closely as she does have a tendency to become hypotensive. Has had episodes of NSVT. -Chest pain on 5/22, likely related to the fact that JP drain was accidentally pulled out, which resulted in severe abdominal pain. Chest x-ray did not show any concerning findings. EKG was nonischemic. Troponins are unremarkable. Don't anticipate further workup unless she has recurrence of her symptoms.  ESRD new, now on HD this admit complicated by intermittent hypotension AKi on CKD 4, due to sepsis, shock Ongoing hemodialysis per Nephrology - perm-cath placed 3/26 d/t AVF problems Patient AV fistula reviewed and not mature yet per Dr. Paulene Floor need to revisit prior to d/c Rate of TPN was reduced by nephrology due to concerns for fluid overload.  C. difficile colitis still on contact precautions -C diff positive on 4-12, Received 4 days of flagyl without improvement, then started on PO vanc -Completed 3 weeks of Oral Vanc-stopped 5/7, this was 1 week after IV Zosyn was stopped 5/5 -Diarrhea resolved -keep contact precautions on board   ACA CVA with Lt sided weakness -TEE ;normal LV function; no LAA thrombus -Started on ASA 81mg  per Neuro -Will need ongoing long term rehab - PT/OT to cont to follow while  inpatient -Patient remains very weak and deconditioned. Will potentially need CIR when medically stable  Chronic diastolic congestive heart failure w/ Severe RHF EF 60-65%, grade 1 diastolic dysfunction. Fluids/volume managed with hemodialysis.  Elevated PA pressures on 2-D echo; CTA negative for PE  Small PFO Has been evaluated by Cardiology. No intervention planned currently.  Anemia of critical illness/chronic disease -and bleeding from leak/fistula which has resolved. -s/p 2U PRBC 4/13 and 5/10 Aranesp/iron per renal -hb stable  Thrombocytopenia; resolved.  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  -also has COPD and PAH -improving, now weaning off O2 -Nebulizer PRN and CPAP QHS   Septic shock  -due to perforated prepyloric ulcer, required pressors -resolved currently  Diabetes mellitus 2 Did have episodes of Hypoglycemia in setting of NPO status. On TPN now, CBGs stabilizedin 120-160s. Hba1c 6.5  Electrolyte imbalance  Being addressed via TPN  Abnormal TSH -Free T4 WNL, T3 1.8 -most likely sick thyroid with ongoing infection and body stress; also due to amiodarone use. TSH in 7 range -continue low dose synthroid given PSVT, now on IV  synthroid   L Renal mass  -noted on [place], Indeterminate 1.9 cm hypoechoic mass off the interpolar region of the left kidney -may potentially represent a cyst however solid mass is not excluded -needs outpatient FU for this  Obesity Inadequate oral intake related to altered GI function as evidenced by limited intake, ongoing.  Body mass index is 34.35 kg/(m^2).  Fall on May 18 Examination did not reveal any injuries.   DVT prophylaxis: SCDs Code Status: FULL Family Communication:   Discussed with patient. No family at bedside. Disposition Plan: Not ready for discharge. Ongoing management for abdominal abscesses and poor nutritional status.  Consultants: Cardiology Nephrology PCCM Gen Surgery  Palliative care  Significant Events: 3/16 influenza A+ 3/18 R ACA stroke 3/19 MRI multifocal acute infarction of both hemispheres, large right distal ACA 3/22 TEE severe RAE, severely reduced RV function, severe TR, no LAA thrombus, small PFO on TEE 3/31 to OR ex lap for per pyloric ulcer and sm bowel resection 4/1 septic shock on pressors 4/3 pressors weaned off  4/4 restart pressors, CRRT 4/6 levo switched to neo 4/7 pelvic abscess on CT 4/8 IR placed pelvic drain 4/9 off pressors 4/11 long run of SVT. 4/23 VQ scan which demonstrated intermediate probability for PE 4/24 CT angiogram of her chest: Pending/unable to be done due to lack of access    Objective: Blood pressure 103/69, pulse 103, temperature 97.9 F (36.6 C), temperature source Oral, resp. rate 19, height  (1.676 m), weight 96.5 kg (212 lb 11.9 oz), SpO2 98 %.  Intake/Output Summary (Last 24 hours) at 08/24/14 0757 Last data filed at 08/24/14 0700  Gross per 24 hour  Intake   2150 ml  Output    500 ml  Net   1650 ml   Exam: General: AAOx3, appears fatigued Lungs: Improved air entry today. No wheezes heard today. No crackles.  Cardiovascular: Regular rate, no murmurs and no gallops, sinus tachycardia  Abdomen: Abdominal wound dressing noted-wounds not examined. Multiple drains also noted. G-tube also present.  Left-sided weakness from stroke  Data Reviewed: Basic Metabolic Panel:  Recent Labs Lab 08/20/14 0500 08/21/14 0253 08/21/14 0825 08/21/14 1526 08/22/14 0530 08/23/14 0415 08/24/14 0520  NA 130* 132* 132* 136  --  137  --   K 3.7 4.2 4.1 4.1  --  4.3  --   CL 96* 98* 96* 101  --  99*  --   CO2 --  30  --   GLUCOSE 125* 123* 138* 107*  --  120*  --   BUN 67*  34* 39* 11  --  14  --   CREATININE 4.66* 2.98* 3.20* 1.71*  --  2.43*  --   CALCIUM 7.6* 7.8* 7.8* 7.7*  --  8.3*  --   MG 2.0 1.9  --   --  2.0 2.0 2.0  PHOS 4.4 3.6 3.9 2.1* 3.3 3.3 5.0*    Liver Function Tests:  Recent Labs Lab 08/18/14 0449 08/19/14 0551 08/19/14 0730 08/20/14 0500 08/21/14 0253 08/21/14 0825 08/21/14 1526  AST --   --   ALT 8* 11* 12* 8* 9*  --   --   ALKPHOS 163* 140* 156* 139* 151*  --   --   BILITOT 1.2 1.0 1.1 0.9 0.8  --   --   PROT 6.2* 5.8* 6.2* 6.2* 6.4*  --   --   ALBUMIN 1.5* 1.4* 1.6* 1.5* 1.6*  1.6* 2.0*   CBC:  Recent Labs Lab 08/19/14 0730  08/21/14 0824 08/21/14 1527 08/22/14 0530 08/23/14 0415 08/24/14 0520  WBC 11.7*  < > 13.4* 12.1* 14.4* 15.3* 15.7*  NEUTROABS 8.5*  --   --   --   --   --  12.5*  HGB 8.0*  < > 8.1* 7.9* 7.9* 7.8* 7.6*  HCT 26.1*  < > 26.4* 25.2* 25.5* 26.2* 25.0*  MCV 90.3  < > 92.0 92.0 93.4 94.6 96.2  PLT 240  < > 276 262 281 307 309  < > = values in this interval not displayed.  CBG:  Recent Labs Lab 08/23/14 0608 08/23/14 1205 08/23/14 1712 08/24/14 0012 08/24/14 0605  GLUCAP 109* 124* 103* 115* 136*    Scheduled Meds:  Scheduled Meds: . antiseptic oral rinse  7 mL Mouth Rinse q12n4p  . budesonide (PULMICORT) nebulizer solution  0.25 mg Nebulization BID  . cefTAZidime (FORTAZ)  IV  2 g Intravenous Q T,Th,Sa-HD  . chlorhexidine  15 mL Mouth Rinse BID  . darbepoetin (ARANESP) injection - DIALYSIS  100 mcg Intravenous Q Thu-HD  . feeding supplement (VITAL AF 1.2 CAL)  1,000 mL Per Tube Q24H  . insulin aspart  0-9 Units Subcutaneous 4 times per day  . levothyroxine  12.5 mcg Intravenous Daily  . metoprolol  2.5 mg Intravenous 4 times per day  . metronidazole  500 mg Intravenous 3 times per day  . sodium chloride  10-40 mL Intracatheter Q12H     Takyra Cantrall [facility] (P) [phone]   If 7PM-7AM, please contact night-coverage [email or web address] Password  [email or web address] 08/24/2014, 7:57 AM   LOS: 68 days

## 2014-08-24 NOTE — Progress Notes (Signed)
[facility] Progress Note  Assessment: 1. New ESRD TTS for now 2. SOB - prob due to vol excess, resolved w extra HD, reducing TNA; Set EDW at 93.5kg for next HD 3. Pelvic fluid s/p drain 5/16 - repeat CT 5/21 shows improved pelvic fluid collection 4. CVA acute by MRI 3/19 5. Perf pyloric ulcer SP exlap, w duod fistula to suction high output on octreotide 6. Nutrition w G-J tube on low dose TF's (VItal)+trying to wean TNA- alb low - per pharm 7. C diff - s/p po Vanc - no diarrhea at present 8. Anemia Aranesp 100 q Thurs last 5/19IV Fe load- if Hgb does not trend up by next Thursday, need to ^ Aranesp 9. HD access needs revision of AVF when current issues resolved 10. 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 Parox SVT on IV MTP q 4hrs 12. 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 Pseudomonas wound culture 5/16 - on Fortaz/flagyl 14. Vitamin C deficiency - getting 1000 mg IV daily w TNA, repeat level ordered for Mon  Plan - next HD Tuesday, cont to lower vol as tolerated. Agree with transition to IM/SQ octreotide if feasible from surgical perspecitive  Sabra Heckyan Kaprice Kage MD  08/24/2014, 9:48 AM    Subjective:   No new issues  Objective Filed Vitals:   08/24/14 0017 08/24/14 0200 08/24/14 0428 08/24/14 0728  BP: 116/72 90/58 111/62 103/69  Pulse: 107 110 109 103  Temp: 97.9 F (36.6 C)  97.6 F (36.4 C) 97.9 F (36.6 C)  TempSrc: Oral  Oral Oral  Resp: 23 22 22 19   Height:      Weight:   96.5 kg (212 lb 11.9 oz)   SpO2: 91% 86% 97% 98%   Physical Exam General: Walking with PT Heart:tachy reg Lungs:clear to A/P now Abdomen: obese, anasarca with dependent back/buttock edema Extremities: 1-2+ dependent edema Dialysis Access: right IJ   Dialysis Orders:  Additional Objective Labs: Basic Metabolic Panel:  Recent Labs Lab 08/21/14 0825 08/21/14 1526 08/22/14 0530 08/23/14 0415 08/24/14 0520  NA 132* 136  --  137  --   K 4.1 4.1  --  4.3  --   CL 96* 101  --   99*  --   CO2 27 29  --  30  --   GLUCOSE 138* 107*  --  120*  --   BUN 39* 11  --  14  --   CREATININE 3.20* 1.71*  --  2.43*  --   CALCIUM 7.8* 7.7*  --  8.3*  --   PHOS 3.9 2.1* 3.3 3.3 5.0*   Liver Function Tests:  Recent Labs Lab 08/19/14 0730 08/20/14 0500 08/21/14 0253 08/21/14 0825 08/21/14 1526  AST 31 20 25   --   --   ALT 12* 8* 9*  --   --   ALKPHOS 156* 139* 151*  --   --   BILITOT 1.1 0.9 0.8  --   --   PROT 6.2* 6.2* 6.4*  --   --   ALBUMIN 1.6* 1.5* 1.6* 1.6* 2.0*  CBC:  Recent Labs Lab 08/19/14 0730  08/21/14 0824 08/21/14 1527 08/22/14 0530 08/23/14 0415 08/24/14 0520  WBC 11.7*  < > 13.4* 12.1* 14.4* 15.3* 15.7*  NEUTROABS 8.5*  --   --   --   --   --  12.5*  HGB 8.0*  < > 8.1* 7.9* 7.9* 7.8* 7.6*  HCT 26.1*  < > 26.4* 25.2* 25.5* 26.2*  25.0*  MCV 90.3  < > 92.0 92.0 93.4 94.6 96.2  PLT 240  < > 276 262 281 307 309  < > = values in this interval not displayed. Blood Culture    Component Value Date/Time   SDES ABSCESS PELVIS 08/17/2014 1451   SPECREQUEST NONE 08/17/2014 1451   CULT  08/17/2014 1451    NO GROWTH 3 DAYS Performed at [facility]    REPTSTATUS 08/21/2014 FINAL 08/17/2014 1451    Cardiac Enzymes:  Recent Labs Lab 08/23/14 1944 08/24/14 0221 08/24/14 0630  TROPONINI 0.04* 0.03 0.03   CBG:  Recent Labs Lab 08/23/14 0608 08/23/14 1205 08/23/14 1712 08/24/14 0012 08/24/14 0605  GLUCAP 109* 124* 103* 115* 136*    Medications: . Marland KitchenTPN (CLINIMIX-E) Adult 30 mL/hr at 08/23/14 1727   And  . fat emulsion 240 mL (08/23/14 1727)  . Marland KitchenTPN (CLINIMIX-E) Adult     And  . fat emulsion    . sodium chloride 10 mL/hr at 08/19/14 0950  . octreotide  (SANDOSTATIN)    IV infusion 50 mcg/hr (08/23/14 1155)   . antiseptic oral rinse  7 mL Mouth Rinse q12n4p  . budesonide (PULMICORT) nebulizer solution  0.25 mg Nebulization BID  . cefTAZidime (FORTAZ)  IV  2 g Intravenous Q T,Th,Sa-HD  . chlorhexidine  15 mL Mouth Rinse  BID  . darbepoetin (ARANESP) injection - DIALYSIS  100 mcg Intravenous Q Thu-HD  . feeding supplement (VITAL AF 1.2 CAL)  1,000 mL Per Tube Q24H  . insulin aspart  0-9 Units Subcutaneous Q8H  . levothyroxine  12.5 mcg Intravenous Daily  . metoprolol  2.5 mg Intravenous 4 times per day  . metronidazole  500 mg Intravenous 3 times per day  . sodium chloride  10-40 mL Intracatheter Q12H

## 2014-08-24 NOTE — Progress Notes (Signed)
[name], female   DOB: Apr 20, 1955, 59 y.o.   MRN: [id number]     [facility]      [address] [place], [place] [address]    Phone: [phone] FAX: [phone]     Subjective: Feels fine.  Wants to drink.  WBC trending up.   Afebrile.  BP soft.  Had CP last night, but denies now.   Objective:  Vital signs:  Filed Vitals:   08/24/14 0017 08/24/14 0200 08/24/14 0428 08/24/14 0728  BP: 116/72 90/58 111/62 103/69  Pulse: 107 110 109 103  Temp: 97.9 F (36.6 C)  97.6 F (36.4 C) 97.9 F (36.6 C)  TempSrc: Oral  Oral Oral  Resp: _0 Height:      Weight:   96.5 kg (212 lb 11.9 oz)   SpO2: 91% 86% 97% 98%    Last BM Date: 08/21/14  Intake/Output   Yesterday:  05/22 0701 - 05/23 0700 In: 2150 [I.V.:610; NG/GT:480; IV Piggyback:100; TPN:960] Out: 500 [Drains:500] This shift:    I/O last 3 completed shifts: In: 2235 [I.V.:635; NG/GT:500; IV Piggyback:100] Out: 1200 [Drains:1200]    Physical Exam: General: Pt awake/alert/oriented x4 in no acute distress  Abdomen: Soft.  Nondistended.   Non tender.  Midline wound is deep, fibrinous exudate which I debrided.  Fascia is intact, sutures are visible.  Red rubber drain with bilious enteric content.  TF at 20m/hr.   No evidence of peritonitis.  No incarcerated hernias.    Problem List:   Principal Problem:   Acute respiratory failure with hypoxia Active Problems:   Cerebral thrombosis with cerebral infarction   Hypertension   ESRD needing dialysis   Obesity (BMI 30-39.9)   Depression   Left renal mass   Chronic diastolic heart failure   [id number]   Tobacco use disorder   Renal failure (ARF), acute on chronic   Hyperlipidemia   PSVT (paroxysmal supraventricular tachycardia)   SOB (shortness of breath)   Perforated gastric ulcer   Abnormal TSH   History of intermediate V/Q scan   HIT (heparin-induced thrombocytopenia)   Fistula    Abdominal pain   Palliative care encounter   ESRD (end stage renal disease)   SVT (supraventricular tachycardia)   Right heart failure   Duodenal anastomotic leak   Protein calorie malnutrition   Itching   Aortic aneurysm   Protein-calorie malnutrition    Results:   Labs: Results for orders placed or performed during the hospital encounter of 06/17/14 (from the past 48 hour(s))  Glucose, capillary     Status: Abnormal   Collection Time: 08/22/14  1:10 PM  Result Value Ref Range   Glucose-Capillary 156 (H) 65 - 99 mg/dL  Glucose, capillary     Status: Abnormal   Collection Time: 08/22/14  5:50 PM  Result Value Ref Range   Glucose-Capillary 111 (H) 65 - 99 mg/dL  Glucose, capillary     Status: Abnormal   Collection Time: 08/22/14 11:55 PM  Result Value Ref Range   Glucose-Capillary 136 (H) 65 - 99 mg/dL  CBC     Status: Abnormal   Collection Time: 08/23/14  4:15 AM  Result Value Ref Range   WBC 15.3 (H) 4.0 - 10.5 K/uL   RBC 2.77 (L) 3.87 - 5.11 MIL/uL   Hemoglobin 7.8 (L) 12.0 - 15.0 g/dL   HCT 26.2 (L) 36.0 - 46.0 %   MCV 94.6 78.0 -  100.0 fL   MCH 28.2 26.0 - 34.0 pg   MCHC 29.8 (L) 30.0 - 36.0 g/dL   RDW 21.3 (H) 11.5 - 15.5 %   Platelets 307 150 - 400 K/uL  Basic metabolic panel     Status: Abnormal   Collection Time: 08/23/14  4:15 AM  Result Value Ref Range   Sodium 137 135 - 145 mmol/L   Potassium 4.3 3.5 - 5.1 mmol/L   Chloride 99 (L) 101 - 111 mmol/L   CO2 30 22 - 32 mmol/L   Glucose, Bld 120 (H) 65 - 99 mg/dL   BUN 14 6 - 20 mg/dL   Creatinine, Ser 2.43 (H) 0.44 - 1.00 mg/dL   Calcium 8.3 (L) 8.9 - 10.3 mg/dL   GFR calc non Af Amer 21 (L) >60 mL/min   GFR calc Af Amer 24 (L) >60 mL/min    Comment: (NOTE) The eGFR has been calculated using the CKD EPI equation. This calculation has not been validated in all clinical situations. eGFR's persistently <60 mL/min signify possible Chronic Kidney Disease.    Anion gap 8 5 - 15  Magnesium     Status: None    Collection Time: 08/23/14  4:15 AM  Result Value Ref Range   Magnesium 2.0 1.7 - 2.4 mg/dL  Phosphorus     Status: None   Collection Time: 08/23/14  4:15 AM  Result Value Ref Range   Phosphorus 3.3 2.5 - 4.6 mg/dL  Glucose, capillary     Status: Abnormal   Collection Time: 08/23/14  6:08 AM  Result Value Ref Range   Glucose-Capillary 109 (H) 65 - 99 mg/dL  Glucose, capillary     Status: Abnormal   Collection Time: 08/23/14 12:05 PM  Result Value Ref Range   Glucose-Capillary 124 (H) 65 - 99 mg/dL  Glucose, capillary     Status: Abnormal   Collection Time: 08/23/14  5:12 PM  Result Value Ref Range   Glucose-Capillary 103 (H) 65 - 99 mg/dL  Troponin I (q 6hr x 3)     Status: Abnormal   Collection Time: 08/23/14  7:44 PM  Result Value Ref Range   Troponin I 0.04 (H) <0.031 ng/mL    Comment:        PERSISTENTLY INCREASED TROPONIN VALUES IN THE RANGE OF 0.04-0.49 ng/mL CAN BE SEEN IN:       -UNSTABLE ANGINA       -CONGESTIVE HEART FAILURE       -MYOCARDITIS       -CHEST TRAUMA       -ARRYHTHMIAS       -LATE PRESENTING MYOCARDIAL INFARCTION       -COPD   CLINICAL FOLLOW-UP RECOMMENDED.   Glucose, capillary     Status: Abnormal   Collection Time: 08/24/14 12:12 AM  Result Value Ref Range   Glucose-Capillary 115 (H) 65 - 99 mg/dL  Troponin I (q 6hr x 3)     Status: None   Collection Time: 08/24/14  2:21 AM  Result Value Ref Range   Troponin I 0.03 <0.031 ng/mL    Comment:        NO INDICATION OF MYOCARDIAL INJURY.   Magnesium     Status: None   Collection Time: 08/24/14  5:20 AM  Result Value Ref Range   Magnesium 2.0 1.7 - 2.4 mg/dL  Phosphorus     Status: Abnormal   Collection Time: 08/24/14  5:20 AM  Result Value Ref Range   Phosphorus 5.0 (H)  2.5 - 4.6 mg/dL  Differential     Status: Abnormal   Collection Time: 08/24/14  5:20 AM  Result Value Ref Range   Neutrophils Relative % 80 (H) 43 - 77 %   Lymphocytes Relative 8 (L) 12 - 46 %   Monocytes Relative 9 3  - 12 %   Eosinophils Relative 3 0 - 5 %   Basophils Relative 0 0 - 1 %   Neutro Abs 12.5 (H) 1.7 - 7.7 K/uL   Lymphs Abs 1.3 0.7 - 4.0 K/uL   Monocytes Absolute 1.4 (H) 0.1 - 1.0 K/uL   Eosinophils Absolute 0.5 0.0 - 0.7 K/uL   Basophils Absolute 0.0 0.0 - 0.1 K/uL   RBC Morphology ELLIPTOCYTES     Comment: POLYCHROMASIA PRESENT   Smear Review LARGE PLATELETS PRESENT   Prealbumin     Status: Abnormal   Collection Time: 08/24/14  5:20 AM  Result Value Ref Range   Prealbumin 9.7 (L) 18 - 38 mg/dL  CBC     Status: Abnormal   Collection Time: 08/24/14  5:20 AM  Result Value Ref Range   WBC 15.7 (H) 4.0 - 10.5 K/uL   RBC 2.60 (L) 3.87 - 5.11 MIL/uL   Hemoglobin 7.6 (L) 12.0 - 15.0 g/dL   HCT 25.0 (L) 36.0 - 46.0 %   MCV 96.2 78.0 - 100.0 fL   MCH 29.2 26.0 - 34.0 pg   MCHC 30.4 30.0 - 36.0 g/dL   RDW 21.5 (H) 11.5 - 15.5 %   Platelets 309 150 - 400 K/uL  Triglycerides     Status: None   Collection Time: 08/24/14  5:20 AM  Result Value Ref Range   Triglycerides 131 <150 mg/dL  Glucose, capillary     Status: Abnormal   Collection Time: 08/24/14  6:05 AM  Result Value Ref Range   Glucose-Capillary 136 (H) 65 - 99 mg/dL  Troponin I (q 6hr x 3)     Status: None   Collection Time: 08/24/14  6:30 AM  Result Value Ref Range   Troponin I 0.03 <0.031 ng/mL    Comment:        NO INDICATION OF MYOCARDIAL INJURY.     Imaging / Studies: Dg Chest Port 1 View  08/23/2014   CLINICAL DATA:  Left chest pain  EXAM: PORTABLE CHEST - 1 VIEW  COMPARISON:  08/18/2014  FINDINGS: Patient is rotated.  Lungs are essentially clear. No focal consolidation. No pleural effusion or pneumothorax.  Cardiomegaly.  Left IJ venous catheter terminates at the cavoatrial junction. Right IJ dual lumen dialysis catheter terminates in the lower SVC.  IMPRESSION: No evidence of acute cardiopulmonary disease.   Electronically Signed   By: Julian Hy M.D.   On: 08/23/2014 19:17   Dg Abd Portable 1v  08/23/2014    CLINICAL DATA:  JP drain, broken, initial encounter  EXAM: PORTABLE ABDOMEN - 1 VIEW  COMPARISON:  CT abdomen pelvis dated 08/21/2014  FINDINGS: A portion of the right flank is excluded from this single image.  The patient's right abdominal surgical drain is not visualized.  Gastrojejunostomy in satisfactory position.  IMPRESSION: The patient's right abdominal surgical drain is not visualized.  Gastrojejunostomy in satisfactory position.   Electronically Signed   By: Julian Hy M.D.   On: 08/23/2014 19:11    Medications / Allergies:  Scheduled Meds: . antiseptic oral rinse  7 mL Mouth Rinse q12n4p  . budesonide (PULMICORT) nebulizer solution  0.25 mg Nebulization BID  .  cefTAZidime (FORTAZ)  IV  2 g Intravenous Q T,Th,Sa-HD  . chlorhexidine  15 mL Mouth Rinse BID  . darbepoetin (ARANESP) injection - DIALYSIS  100 mcg Intravenous Q Thu-HD  . feeding supplement (VITAL AF 1.2 CAL)  1,000 mL Per Tube Q24H  . insulin aspart  0-9 Units Subcutaneous 4 times per day  . levothyroxine  12.5 mcg Intravenous Daily  . metoprolol  2.5 mg Intravenous 4 times per day  . metronidazole  500 mg Intravenous 3 times per day  . sodium chloride  10-40 mL Intracatheter Q12H   Continuous Infusions: . Marland KitchenTPN (CLINIMIX-E) Adult 30 mL/hr at 08/23/14 1727   And  . fat emulsion 240 mL (08/23/14 1727)  . sodium chloride 10 mL/hr at 08/19/14 0950  . octreotide  (SANDOSTATIN)    IV infusion 50 mcg/hr (08/23/14 1155)   PRN Meds:.bisacodyl, camphor-menthol, diphenhydrAMINE, diphenhydrAMINE-zinc acetate, HYDROmorphone (DILAUDID) injection, levalbuterol, ondansetron (ZOFRAN) IV, sodium chloride  Antibiotics: Anti-infectives    Start     Dose/Rate Route Frequency Ordered Stop   08/21/14 1430  cefTAZidime (FORTAZ) 1 g in dextrose 5 % 50 mL IVPB     1 g 100 mL/hr over 30 Minutes Intravenous  Once 08/21/14 1426 08/21/14 1547   08/18/14 1300  metroNIDAZOLE (FLAGYL) IVPB 500 mg     500 mg 100 mL/hr over 60 Minutes  Intravenous 3 times per day 08/18/14 1045     08/18/14 1200  cefTAZidime (FORTAZ) 2 g in dextrose 5 % 50 mL IVPB     2 g 100 mL/hr over 30 Minutes Intravenous Every T-Th-Sa (Hemodialysis) 08/18/14 1054     08/12/14 1449  ceFAZolin (ANCEF) 2-3 GM-% IVPB SOLR    Comments:  Shaaron Adler   : cabinet override      08/12/14 1449 08/12/14 1629   08/12/14 1130  ceFAZolin (ANCEF) IVPB 2 g/50 mL premix    Comments:  Hang ON CALL to xray 5/11   2 g 100 mL/hr over 30 Minutes Intravenous To Radiology 08/12/14 1035 08/12/14 1520   08/04/14 1200  vancomycin (VANCOCIN) 50 mg/mL oral solution 125 mg  Status:  Discontinued     125 mg Oral 4 times per day 08/04/14 0851 08/08/14 1229   07/18/14 1800  vancomycin (VANCOCIN) 50 mg/mL oral solution 125 mg  Status:  Discontinued     125 mg Oral 4 times per day 07/18/14 1455 08/04/14 0845   07/17/14 1830  vancomycin (VANCOCIN) IVPB 750 mg/150 ml premix     750 mg 150 mL/hr over 60 Minutes Intravenous  Once 07/17/14 1818 07/18/14 0024   07/15/14 1400  metroNIDAZOLE (FLAGYL) tablet 500 mg  Status:  Discontinued     500 mg Oral 3 times per day 07/15/14 1202 07/18/14 1533   07/13/14 2200  piperacillin-tazobactam (ZOSYN) IVPB 2.25 g  Status:  Discontinued     2.25 g 100 mL/hr over 30 Minutes Intravenous 3 times per day 07/13/14 1318 07/24/14 1138   07/13/14 2000  fluconazole (DIFLUCAN) IVPB 200 mg  Status:  Discontinued     200 mg 100 mL/hr over 60 Minutes Intravenous Every 24 hours 07/13/14 1318 07/19/14 1027   07/07/14 1200  vancomycin (VANCOCIN) IVPB 1000 mg/200 mL premix  Status:  Discontinued     1,000 mg 200 mL/hr over 60 Minutes Intravenous Every 24 hours 07/07/14 0937 07/14/14 1712   07/06/14 2000  fluconazole (DIFLUCAN) IVPB 400 mg  Status:  Discontinued     400 mg 100 mL/hr over 120 Minutes Intravenous Every  24 hours 07/06/14 1507 07/13/14 1318   07/06/14 1800  piperacillin-tazobactam (ZOSYN) IVPB 2.25 g  Status:  Discontinued     2.25 g 100 mL/hr  over 30 Minutes Intravenous 4 times per day 07/06/14 1505 07/13/14 1318   07/05/14 2000  fluconazole (DIFLUCAN) IVPB 200 mg  Status:  Discontinued     200 mg 100 mL/hr over 60 Minutes Intravenous Every 24 hours 07/05/14 0757 07/06/14 1507   07/05/14 1400  piperacillin-tazobactam (ZOSYN) IVPB 2.25 g  Status:  Discontinued     2.25 g 100 mL/hr over 30 Minutes Intravenous 3 times per day 07/05/14 0749 07/06/14 1505   07/02/14 1800  vancomycin (VANCOCIN) IVPB 1000 mg/200 mL premix  Status:  Discontinued     1,000 mg 200 mL/hr over 60 Minutes Intravenous Every 24 hours 07/01/14 1858 07/05/14 0801   07/01/14 2200  piperacillin-tazobactam (ZOSYN) IVPB 3.375 g  Status:  Discontinued     3.375 g 100 mL/hr over 30 Minutes Intravenous 4 times per day 07/01/14 1858 07/05/14 0749   07/01/14 2000  fluconazole (DIFLUCAN) IVPB 400 mg  Status:  Discontinued     400 mg 100 mL/hr over 120 Minutes Intravenous Every 24 hours 07/01/14 1858 07/05/14 0757   07/01/14 1400  fluconazole (DIFLUCAN) IVPB 200 mg  Status:  Discontinued     200 mg 100 mL/hr over 60 Minutes Intravenous Every 24 hours 07/01/14 1330 07/01/14 1853   07/01/14 1000  piperacillin-tazobactam (ZOSYN) IVPB 2.25 g  Status:  Discontinued     2.25 g 100 mL/hr over 30 Minutes Intravenous Every 8 hours 07/01/14 0952 07/01/14 1853   07/01/14 1000  vancomycin (VANCOCIN) 500 mg in sodium chloride 0.9 % 100 mL IVPB     500 mg 100 mL/hr over 60 Minutes Intravenous  Once 07/01/14 0952 07/01/14 1126   06/30/14 1200  vancomycin (VANCOCIN) IVPB 1000 mg/200 mL premix     1,000 mg 200 mL/hr over 60 Minutes Intravenous  Once 06/30/14 0944 06/30/14 1403   06/30/14 1200  ceFEPIme (MAXIPIME) 2 g in dextrose 5 % 50 mL IVPB     2 g 100 mL/hr over 30 Minutes Intravenous  Once 06/30/14 0944 06/30/14 1425   06/30/14 0100  vancomycin (VANCOCIN) 2,000 mg in sodium chloride 0.9 % 500 mL IVPB     2,000 mg 250 mL/hr over 120 Minutes Intravenous  Once 06/30/14 0048  06/30/14 0525   06/30/14 0100  ceFEPIme (MAXIPIME) 2 g in dextrose 5 % 50 mL IVPB     2 g 100 mL/hr over 30 Minutes Intravenous  Once 06/30/14 0048 06/30/14 0322   06/27/14 0600  cefUROXime (ZINACEF) 1.5 g in dextrose 5 % 50 mL IVPB     1.5 g 100 mL/hr over 30 Minutes Intravenous On call to O.R. 06/26/14 1019 06/27/14 0630   06/19/14 1800  oseltamivir (TAMIFLU) capsule 30 mg     30 mg Oral Every M-W-F (1800) 06/19/14 1003 06/22/14 1841   06/18/14 1600  oseltamivir (TAMIFLU) capsule 30 mg  Status:  Discontinued     30 mg Oral Daily 06/18/14 1538 06/19/14 1003      Assessment/Plan: POD #54 s/p Exploratory Laparotomy with graham patch closure of perforated pyloric ulcer, SBR for ischemic bowel - 07/01/2014 - Dalbert Batman  Intra-abdominal (pelvic) fluid collection Duodenal fistula -CT guided aspiration CX PSEUDOMONAS AERUGINOSA--back on flagyl/fortaz -advance TF slowly to 61m/hr, check residuals -fistula controlled by red rubber(pulled out surgical drain) -apply abdominal binder to prevent pt from pulling at tubed -NPO -octreotide,  will discuss if needed.  If so, will change to SQ to decrease volume -BID wet to dry dressing changes -CT 5/20 showed a decrease in pelvic fluid collection(pt pulled out perc drain) ID-Cipro/Flagyl per ID. C diff colitis New ESRD PCM-prealbumin 9.5, increasing TF   Caidence Kaseman, ANP-BC [facility] Surgery Pager [phone](7A-4:30P) For consults and floor pages call [phone](7A-4:30P)  08/24/2014 8:02 AM

## 2014-08-25 LAB — CBC
HCT: 25.6 % — ABNORMAL LOW (ref 36.0–46.0)
Hemoglobin: 7.9 g/dL — ABNORMAL LOW (ref 12.0–15.0)
MCH: 29.6 pg (ref 26.0–34.0)
MCHC: 30.9 g/dL (ref 30.0–36.0)
MCV: 95.9 fL (ref 78.0–100.0)
Platelets: 357 10*3/uL (ref 150–400)
RBC: 2.67 MIL/uL — AB (ref 3.87–5.11)
RDW: 22.1 % — AB (ref 11.5–15.5)
WBC: 13.6 10*3/uL — ABNORMAL HIGH (ref 4.0–10.5)

## 2014-08-25 LAB — COMPREHENSIVE METABOLIC PANEL
ALBUMIN: 2 g/dL — AB (ref 3.5–5.0)
ALK PHOS: 127 U/L — AB (ref 38–126)
ALT: 9 U/L — AB (ref 14–54)
ANION GAP: 9 (ref 5–15)
AST: 17 U/L (ref 15–41)
BUN: 34 mg/dL — ABNORMAL HIGH (ref 6–20)
CO2: 28 mmol/L (ref 22–32)
Calcium: 8.3 mg/dL — ABNORMAL LOW (ref 8.9–10.3)
Chloride: 101 mmol/L (ref 101–111)
Creatinine, Ser: 5.1 mg/dL — ABNORMAL HIGH (ref 0.44–1.00)
GFR calc Af Amer: 10 mL/min — ABNORMAL LOW (ref >60)
GFR, EST NON AFRICAN AMERICAN: 8 mL/min — AB (ref >60)
GLUCOSE: 83 mg/dL (ref 65–99)
POTASSIUM: 4.5 mmol/L (ref 3.5–5.1)
SODIUM: 138 mmol/L (ref 135–145)
Total Bilirubin: 1.3 mg/dL — ABNORMAL HIGH (ref 0.3–1.2)
Total Protein: 6.7 g/dL (ref 6.5–8.1)

## 2014-08-25 LAB — RENAL FUNCTION PANEL
ALBUMIN: 2.1 g/dL — AB (ref 3.5–5.0)
Anion gap: 12 (ref 5–15)
BUN: 34 mg/dL — ABNORMAL HIGH (ref 6–20)
CO2: 27 mmol/L (ref 22–32)
Calcium: 8.1 mg/dL — ABNORMAL LOW (ref 8.9–10.3)
Chloride: 100 mmol/L — ABNORMAL LOW (ref 101–111)
Creatinine, Ser: 5.05 mg/dL — ABNORMAL HIGH (ref 0.44–1.00)
GFR calc Af Amer: 10 mL/min — ABNORMAL LOW (ref >60)
GFR, EST NON AFRICAN AMERICAN: 9 mL/min — AB (ref >60)
GLUCOSE: 90 mg/dL (ref 65–99)
PHOSPHORUS: 6.1 mg/dL — AB (ref 2.5–4.6)
Potassium: 4.5 mmol/L (ref 3.5–5.1)
Sodium: 139 mmol/L (ref 135–145)

## 2014-08-25 LAB — GLUCOSE, CAPILLARY
Glucose-Capillary: 122 mg/dL — ABNORMAL HIGH (ref 65–99)
Glucose-Capillary: 124 mg/dL — ABNORMAL HIGH (ref 65–99)

## 2014-08-25 MED ORDER — ALPRAZOLAM 0.5 MG PO TABS
0.2500 mg | ORAL_TABLET | Freq: Once | ORAL | Status: AC
  Administered 2014-08-25: 0.25 mg via ORAL

## 2014-08-25 MED ORDER — TRACE MINERALS CR-CU-F-FE-I-MN-MO-SE-ZN IV SOLN
INTRAVENOUS | Status: AC
  Administered 2014-08-25: 18:00:00 via INTRAVENOUS
  Filled 2014-08-25: qty 960

## 2014-08-25 MED ORDER — ANTICOAGULANT SODIUM CITRATE 4% (200MG/5ML) IV SOLN
5.0000 mL | Status: AC
  Administered 2014-08-25: 5 mL via INTRAVENOUS
  Filled 2014-08-25 (×2): qty 250

## 2014-08-25 MED ORDER — VITAL AF 1.2 CAL PO LIQD
1000.0000 mL | ORAL | Status: DC
  Administered 2014-08-25: 1000 mL
  Filled 2014-08-25 (×3): qty 1000

## 2014-08-25 MED ORDER — IPRATROPIUM-ALBUTEROL 0.5-2.5 (3) MG/3ML IN SOLN
RESPIRATORY_TRACT | Status: AC
  Filled 2014-08-25: qty 3

## 2014-08-25 MED ORDER — SODIUM CHLORIDE 0.9 % IV BOLUS (SEPSIS)
250.0000 mL | Freq: Once | INTRAVENOUS | Status: AC
  Administered 2014-08-25: 250 mL via INTRAVENOUS

## 2014-08-25 MED ORDER — ALPRAZOLAM 0.5 MG PO TABS
ORAL_TABLET | ORAL | Status: AC
  Filled 2014-08-25: qty 1

## 2014-08-25 NOTE — Progress Notes (Signed)
Pt received HD tx with no complications. Pt alert, vss, tachy, 2.5 L removed. Report called to floor nurse. Patient returned safely to room.

## 2014-08-25 NOTE — Procedures (Signed)
I was present at this dialysis session. I have reviewed the session itself and made appropriate changes.   Pt with regular tachycardia in 150s on HD.  SVT ? ST.  Havingt some anxiety.  BP astable.  Minimal UF goal.  Follow closely.    Sabra Heckyan Hillari Zumwalt  MD 08/25/2014, 8:26 AM

## 2014-08-25 NOTE — Progress Notes (Signed)
[name] ZOX:[id number] DOB: 03/03/1956 DOA: 06/17/2014 PCP: Willey BladeEAN, [name], MD   Brief Narrative: 59 yo ? smoker with hx HTN, COPD, CKD IV followed @ [facility]-Dr. [name], failed L AV fistula, initially admitted 3/16 with flu and acute hypoxic resp failure. Ultimately tx to [facility] due to need for HD. L sided weakness 3/18 and found to have R ACA stroke-showed acute infarct R ACA. Patient more lethargic 3/30 and CT abd=perf viscus 3/31. She was taken to the OR for ex lap for perforated pyloric ulcer with diffuse peritonitis, ischemic necrosis of mid ileum. Patient developed septic shock on 4-1, she was started on pressors and wean off pressors on 4-3. Subsequently she was diagnosed with pelvic abscess by CT scan perform on 4-7. She underwent pelvic drain placement by IR. C. diff diagnosed 4-12-initially unresponsive to flagyl, and she was started on vancomycin 4-16. Develops  run of SVT. Cardiology was helping with management. Course complicated with high RV pressure, CT angio negative for PE.  Also with duodenal fistula, making her back to NPO state and in need of TPN. Palliative care consulted. On 5/2 she developed hypoxemia, hypotension and SVT during dialysis treatment. Dialysis was stopped. She received IV bolus. She was started on BIPAP. She was transferred to Step down unit. PCCM and cardiology re consulted. Her Hr decreased, BP improved. She was taken off BIPAP. This recurred on 5/15 with episodic SVT.  Now stable, on TNA + very high output from fistula/leak per [facility]. Had a G_J tube placed by Dr. [name] of IR and Gen surgery has continued to assist with management of her multiple surgical comorbidities. CT repeat 5/14 showed 5 cm abscess-transgluteal drain with 5 ml of purulent fluid removed on 5/17.  Gluteal drain was inadvertently pulled out on May 19. Repeat CT scan shows improvement in the fluid collection.  Her JP drain was pulled out inadvertently on May 22. It was replaced by surgery  on May 23.  CIR is interested in taking her if she can stabilize enough from her medical conditions  Subjective: Patient experienced shortness of breath. This morning while at dialysis. She had to be given a nebulizer treatment. Feels better currently. Anxious.   Assessment/Plan:  Status post exploratory laparotomy with closure of perforated pyloric ulcer/abdominal abscess / small bowel resection w/ postop ileus and now with fistula. Pelvic abscess s/p percutaneous drain placement 4/8; had leakage from anastomosis site. Received Zosyn for 23 days. Chronic low grade leukocytosis range 13-15 -IV antibiotics were discontinued by Surgery on 4/22. Also received 18 days of Vancomycin and fluconazole.  -Due to Fistula is back to NPO state, TPN. Trickle feeds being attempted. -Fistula study done shows JP drain connection to the duodenal bulb-5-02. - repeat CT abdomen with  3.0 x 3.5 x 5.7 cm collection in the pelvic cul-de-sac and extraluminal air alongside JP drain in anterior abd wall -Drain with old and new blood, Hb trending down, transfuse 2 unit PRBC 5/10 -continues to have high output from Fistula/leak. Had G_J placed by IR 08/12/14 at request of gen surgery -Patient continues to be on octreotide, which is being managed by surgery.  -CT repeat shows 5 cm post cul-de-sac abscess which was drained on 5/16-culture pending  -ID was consulted as patient had Pseudomonas growing from her culture wounds and they started back Flagyl as well as Elita QuickFortaz on 5/16 and are following and we await final growth from her new abscess drained on 5/16. -Patient inadvertently pulled out her gluteal drain May 19.  Seen by interventional radiology and they recommended repeat CT scan, which was done. Shows that the fluid collection has decreased in size. They do not plan to replace the drain at this time. Continue with antibiotics alone.  -CT scan did suggest a transmural defect in the stomach wall near the patient's pyloric  Cheree Ditto patch. We will let surgery address this. -Patient accidentally pulled out her JP drain on 5/22. This has been replaced by surgery on 5/23. -Octreotide was discontinued 5/23 - Continue with tube feedings. Seems to be tolerating. TPN rate had to be reduced due to fluid overload. Rate of the 2. Feeding was increased 5/23 to 30 mL per hour.  PSVT  -Felt to be secondary to severe anxiety with dialysis   -Has been evaluated by EP.  -Cardiology following peripherally- follow K+ and Mg  -Continue IV metoprolol, now off amiodarone;  -V-Q scan with intermediate probability for pulmonary embolism.  -CTA 4/27, negative for PE   -Heart rate better controlled with scheduled intravenous metoprolol. Monitor blood pressures closely as she does have a tendency to become hypotensive. Has had episodes of NSVT. -Chest pain on 5/22, likely related to the fact that JP drain was accidentally pulled out, which resulted in severe abdominal pain. Chest x-ray did not show any concerning findings. EKG was nonischemic. Troponins are unremarkable. Don't anticipate further workup unless she has recurrence of her symptoms.  ESRD new, now on HD this admit complicated by intermittent hypotension AKi on CKD 4, due to sepsis, shock Ongoing hemodialysis per Nephrology - perm-cath placed 3/26 d/t AVF problems Patient AV fistula reviewed and not mature yet per Dr. Paulene Floor need to revisit prior to d/c Rate of TPN was reduced by nephrology due to concerns for fluid overload.  C. difficile colitis still on contact precautions -C diff positive on 4-12, Received 4 days of flagyl without improvement, then started on PO vanc -Completed 3 weeks of Oral Vanc-stopped 5/7, this was 1 week after IV Zosyn was stopped 5/5 -Diarrhea resolved -keep contact precautions on board   ACA CVA with Lt sided weakness -TEE ;normal LV function; no LAA thrombus -Started on ASA 81mg  per Neuro -Will need ongoing long term rehab - PT/OT to cont  to follow while inpatient -Patient remains very weak and deconditioned. Will potentially need CIR when medically stable  Chronic diastolic congestive heart failure w/ Severe RHF EF 60-65%, grade 1 diastolic dysfunction. Fluids/volume managed with hemodialysis.  Elevated PA pressures on 2-D echo; CTA negative for PE  Small PFO Has been evaluated by Cardiology. No intervention planned currently.  Anemia of critical illness/chronic disease -and bleeding from leak/fistula which has resolved. -s/p 2U PRBC 4/13 and 5/10 Aranesp/iron per renal -hb stable  Thrombocytopenia; resolved.  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  -also has COPD and PAH -improving, now weaning off O2 -Nebulizer PRN and CPAP QHS   Septic shock  -due to perforated prepyloric ulcer, required pressors -resolved currently  Diabetes mellitus 2 Did have episodes of Hypoglycemia in setting of NPO status. On TPN now, CBGs stabilizedin 120-160s. Hba1c 6.5  Electrolyte imbalance  Being addressed via TPN  Abnormal TSH -Free T4 WNL, T3 1.8 -most likely sick thyroid with ongoing infection and body stress; also due to amiodarone use. TSH in 7 range -continue low  dose synthroid given PSVT, now on IV synthroid   L Renal mass  -noted on [place], Indeterminate 1.9 cm hypoechoic mass off the interpolar region of the left kidney -may potentially represent a cyst however solid mass is not excluded -needs outpatient FU for this  Obesity Inadequate oral intake related to altered GI function as evidenced by limited intake, ongoing.  Body mass index is 35.03 kg/(m^2).  Fall on May 18 Examination did not reveal any injuries.   DVT prophylaxis: SCDs Code Status: FULL Family  Communication:  Discussed with patient. No family at bedside. Disposition Plan: Have been unable to transfer patient to stepdown unit due to dyspnea and tachycardia. She also has complex wound care needs. Dialysis is ongoing. Not ready for discharge.  Consultants: Cardiology Nephrology PCCM Gen Surgery  Palliative care  Significant Events: 3/16 influenza A+ 3/18 R ACA stroke 3/19 MRI multifocal acute infarction of both hemispheres, large right distal ACA 3/22 TEE severe RAE, severely reduced RV function, severe TR, no LAA thrombus, small PFO on TEE 3/31 to OR ex lap for per pyloric ulcer and sm bowel resection 4/1 septic shock on pressors 4/3 pressors weaned off  4/4 restart pressors, CRRT 4/6 levo switched to neo 4/7 pelvic abscess on CT 4/8 IR placed pelvic drain 4/9 off pressors 4/11 long run of SVT. 4/23 VQ scan which demonstrated intermediate probability for PE 4/24 CT angiogram of her chest: Pending/unable to be done due to lack of access    Objective: Blood pressure 100/67, pulse 131, temperature 98.7 F (37.1 C), temperature source Oral, resp. rate 18, height  (1.676 m), weight 98.4 kg (216 lb 14.9 oz), SpO2 99 %.  Intake/Output Summary (Last 24 hours) at 08/25/14 0909 Last data filed at 08/25/14 0535  Gross per 24 hour  Intake 1887.49 ml  Output   1650 ml  Net 237.49 ml   Exam: General: AAOx3,  Lungs: No obvious wheezing is heard today. No crackles. Diminished air entry at the bases.   Cardiovascular: S1, S2 is tachycardic this morning. Abdomen: Abdominal wound dressing noted-wounds not examined. Multiple drains also noted. G-tube also present.  Left-sided weakness from stroke  Data Reviewed: Basic Metabolic Panel:  Recent Labs Lab 08/20/14 0500 08/21/14 0253 08/21/14 0825 08/21/14 1526 08/22/14 0530 08/23/14 0415 08/24/14 0520 08/25/14 0525 08/25/14 0747  NA 130* 132* 132* 136  --  137  --  138 139  K 3.7 4.2 4.1 4.1  --  4.3  --  4.5 4.5    CL 96* 98* 96* 101  --  99*  --  101 100*  CO2 --  30  --  28 27  GLUCOSE 125* 123* 138* 107*  --  120*  --  83 90  BUN 67* 34* 39* 11  --  14  --  34* 34*  CREATININE 4.66* 2.98* 3.20* 1.71*  --  2.43*  --  5.10* 5.05*  CALCIUM 7.6* 7.8* 7.8* 7.7*  --  8.3*  --  8.3* 8.1*  MG 2.0 1.9  --   --  2.0 2.0 2.0  --   --   PHOS 4.4 3.6 3.9 2.1* 3.3 3.3 5.0*  --  6.1*    Liver Function Tests:  Recent Labs Lab 08/19/14 0551 08/19/14 0730 08/20/14 0500 08/21/14 0253 08/21/14 0825 08/21/14 1526 08/25/14 0525 08/25/14 0747  AST --   --  17  --   ALT 11* 12* 8* 9*  --   --  9*  --   ALKPHOS 140* 156* 139* 151*  --   --  127*  --   BILITOT 1.0 1.1 0.9 0.8  --   --  1.3*  --   PROT 5.8* 6.2* 6.2* 6.4*  --   --  6.7  --   ALBUMIN 1.4* 1.6* 1.5* 1.6* 1.6* 2.0* 2.0* 2.1*   CBC:  Recent Labs Lab 08/19/14 0730  08/21/14 1527 08/22/14 0530 08/23/14 0415 08/24/14 0520 08/25/14 0525  WBC 11.7*  < > 12.1* 14.4* 15.3* 15.7* 13.6*  NEUTROABS 8.5*  --   --   --   --  12.5*  --   HGB 8.0*  < > 7.9* 7.9* 7.8* 7.6* 7.9*  HCT 26.1*  < > 25.2* 25.5* 26.2* 25.0* 25.6*  MCV 90.3  < > 92.0 93.4 94.6 96.2 95.9  PLT 240  < > 262 281 307 309 357  < > = values in this interval not displayed.  CBG:  Recent Labs Lab 08/23/14 1712 08/24/14 0012 08/24/14 0605 08/24/14 1631 08/25/14 0110  GLUCAP 103* 115* 136* 139* 122*    Scheduled Meds:  Scheduled Meds: . ALPRAZolam      . antiseptic oral rinse  7 mL Mouth Rinse q12n4p  . budesonide (PULMICORT) nebulizer solution  0.25 mg Nebulization BID  . cefTAZidime (FORTAZ)  IV  2 g Intravenous Q T,Th,Sa-HD  . chlorhexidine  15 mL Mouth Rinse BID  . darbepoetin (ARANESP) injection - DIALYSIS  100 mcg Intravenous Q Thu-HD  . feeding supplement (VITAL AF 1.2 CAL)  1,000 mL Per Tube Q24H  . insulin aspart  0-9 Units Subcutaneous Q8H  . ipratropium-albuterol      . levothyroxine  12.5 mcg Intravenous Daily  . metoprolol   2.5 mg Intravenous 4 times per day  . metronidazole  500 mg Intravenous 3 times per day  . sodium chloride  10-40 mL Intracatheter Q12H     Sherril Shipman [facility] (P) [phone]   If 7PM-7AM, please contact night-coverage [email or web address] Password [email or web address] 08/25/2014, 9:09 AM   LOS: 69 days

## 2014-08-25 NOTE — Progress Notes (Signed)
Nutrition Consult/Follow-Up  DOCUMENTATION CODES:  Obesity unspecified  INTERVENTION:  TPN, Tube feeding   As medically appropriate, advance Vital AF 1.2 formula to goal rate of 75 ml/hr to provide 2160 kcals, 135 gm protein, 1460 ml free water daily  NUTRITION DIAGNOSIS:  Inadequate oral intake related to inability to eat as evidenced by NPO status, ongoing  GOAL:  Patient will meet greater than or equal to 90% of their needs, progressing  MONITOR:  TF tolerance, Labs, Skin, I & O's, Weight trends (TPN adequacy)  ASSESSMENT: Pt with CKD and hypertension, presented on 3/16 with shortness of breath and generalized weakness x 2 weeks. Pt found to have metabolic acidosis from worsening uremia, H1N1 flu. Pt started on HD. 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  Patient s/p procedure 3/30: EXPLORATORY LAPAROTOMY CLOSURE OF PERFORATED PYLORIC ULCER SMALL BOWEL RESECTION  Patient s/p procedure 5/11: G-J TUBE PLACEMENT -- tip past ligament of Treitz  Patient continues to receive TPN with Clinimix E 5/15 @ 40 ml/hr and lipids @ 10 ml/hr. Provides 682 kcal and 43 grams protein per day.   Patient current in HEMODIALYSIS.  RD spoke with RN.  Prior to leaving floor, Vital AF 1.2 infusing via J-port at 30 ml/hr.  Orders to increase to 35 ml/hr (1008 kcals, 63 gm protein).  Current TPN & TF regimen to provide 1690 kcals, 111 gm protein which meets 80% of estimated kcal needs, 92% of estimated protein needs.  Height:  Ht Readings from Last 1 Encounters:  08/02/14 5\' 6"  (1.676 m)    Weight:  Wt Readings from Last 1 Encounters:  08/25/14 216 lb 14.9 oz (98.4 kg)    Ideal Body Weight:  59.1 kg  Wt Readings from Last 10 Encounters:  08/25/14 216 lb 14.9 oz (98.4 kg)  06/01/14 225 lb (102.059 kg)  01/28/14 230 lb (104.327 kg)  01/21/14 231 lb 1.6 oz (104.826 kg)  10/02/12 216 lb 8 oz (98.204 kg)    BMI:   Body mass index is 35.03 kg/(m^2).  Estimated Nutritional Needs:  Kcal:  2100-2300  Protein:  120-140 gm  Fluid:  1.2 L  Skin:  Wound (see comment) (stage 2 pressure ulcer to sacrum)  Diet Order:  Diet NPO time specified .TPN (CLINIMIX-E) Adult TPN (CLINIMIX) Adult without lytes  EDUCATION NEEDS:  Education needs no appropriate at this time   Intake/Output Summary (Last 24 hours) at 08/25/14 1040 Last data filed at 08/25/14 0535  Gross per 24 hour  Intake 1782.49 ml  Output   1650 ml  Net 132.49 ml    Last BM:  5/21  Maureen ChattersKatie Alesia Oshields, RD, LDN Pager #: [phone] After-Hours Pager #: [phone]

## 2014-08-25 NOTE — Progress Notes (Signed)
[name], female   DOB: 1955-09-04, 58 y.o.   MRN: [id number]     [facility]      [address] [place], [place] [address]    Phone: [phone] FAX: [phone]     Subjective: More alert today and smiling.  VSS.  Afebrile.  HD events noted. Pt was seen in HD.  Denies nausea or vomiting. No residuals recorded.   Increased drain output to red rubber 1900. 58m GJ which is about the same.  Objective:  Vital signs:  Filed Vitals:   08/25/14 0730 08/25/14 0800 08/25/14 0830 08/25/14 0855  BP: 112/66 109/67 118/67 100/67  Pulse: 107 108 114 131  Temp:      TempSrc:      Resp: _0 Height:      Weight:      SpO2: 97% 93% 100% 99%    Last BM Date: 08/21/14  Intake/Output   Yesterday:  05/23 0701 - 05/24 0700 In: 2082.3 [I.V.:300.8; NG/GT:314.8; IV Piggyback:100; TPN:1366.7] Out: 2450 [Drains:2450] This shift:    I/O last 3 completed shifts: In: 3197.3 [I.V.:635.8; NG/GT:574.8; IV Piggyback:100] Out: 2450 [Drains:2450]      Physical Exam: General: Pt awake/alert/oriented x4 in no acute distress  Abdomen: Soft. Nondistended. Non tender. Midline wound is dry, did not eval.  Red rubber drain with bilious enteric content. TF at 229mhr. No evidence of peritonitis. No incarcerated hernias.   Problem List:   Principal Problem:   Acute respiratory failure with hypoxia Active Problems:   Cerebral thrombosis with cerebral infarction   Hypertension   ESRD needing dialysis   Obesity (BMI 30-39.9)   Depression   Left renal mass   Chronic diastolic heart failure   [id number]   Tobacco use disorder   Renal failure (ARF), acute on chronic   Hyperlipidemia   PSVT (paroxysmal supraventricular tachycardia)   SOB (shortness of breath)   Perforated gastric ulcer   Abnormal TSH   History of intermediate V/Q scan   HIT (heparin-induced thrombocytopenia)   Fistula   Abdominal pain    Palliative care encounter   ESRD (end stage renal disease)   SVT (supraventricular tachycardia)   Right heart failure   Duodenal anastomotic leak   Protein calorie malnutrition   Itching   Aortic aneurysm   Protein-calorie malnutrition    Results:   Labs: Results for orders placed or performed during the hospital encounter of 06/17/14 (from the past 48 hour(s))  Glucose, capillary     Status: Abnormal   Collection Time: 08/23/14 12:05 PM  Result Value Ref Range   Glucose-Capillary 124 (H) 65 - 99 mg/dL  Glucose, capillary     Status: Abnormal   Collection Time: 08/23/14  5:12 PM  Result Value Ref Range   Glucose-Capillary 103 (H) 65 - 99 mg/dL  Troponin I (q 6hr x 3)     Status: Abnormal   Collection Time: 08/23/14  7:44 PM  Result Value Ref Range   Troponin I 0.04 (H) <0.031 ng/mL    Comment:        PERSISTENTLY INCREASED TROPONIN VALUES IN THE RANGE OF 0.04-0.49 ng/mL CAN BE SEEN IN:       -UNSTABLE ANGINA       -CONGESTIVE HEART FAILURE       -MYOCARDITIS       -CHEST TRAUMA       -ARRYHTHMIAS       -LATE PRESENTING MYOCARDIAL  INFARCTION       -COPD   CLINICAL FOLLOW-UP RECOMMENDED.   Glucose, capillary     Status: Abnormal   Collection Time: 08/24/14 12:12 AM  Result Value Ref Range   Glucose-Capillary 115 (H) 65 - 99 mg/dL  Troponin I (q 6hr x 3)     Status: None   Collection Time: 08/24/14  2:21 AM  Result Value Ref Range   Troponin I 0.03 <0.031 ng/mL    Comment:        NO INDICATION OF MYOCARDIAL INJURY.   Magnesium     Status: None   Collection Time: 08/24/14  5:20 AM  Result Value Ref Range   Magnesium 2.0 1.7 - 2.4 mg/dL  Phosphorus     Status: Abnormal   Collection Time: 08/24/14  5:20 AM  Result Value Ref Range   Phosphorus 5.0 (H) 2.5 - 4.6 mg/dL  Differential     Status: Abnormal   Collection Time: 08/24/14  5:20 AM  Result Value Ref Range   Neutrophils Relative % 80 (H) 43 - 77 %   Lymphocytes Relative 8 (L) 12 - 46 %   Monocytes  Relative 9 3 - 12 %   Eosinophils Relative 3 0 - 5 %   Basophils Relative 0 0 - 1 %   Neutro Abs 12.5 (H) 1.7 - 7.7 K/uL   Lymphs Abs 1.3 0.7 - 4.0 K/uL   Monocytes Absolute 1.4 (H) 0.1 - 1.0 K/uL   Eosinophils Absolute 0.5 0.0 - 0.7 K/uL   Basophils Absolute 0.0 0.0 - 0.1 K/uL   RBC Morphology ELLIPTOCYTES     Comment: POLYCHROMASIA PRESENT   Smear Review LARGE PLATELETS PRESENT   Prealbumin     Status: Abnormal   Collection Time: 08/24/14  5:20 AM  Result Value Ref Range   Prealbumin 9.7 (L) 18 - 38 mg/dL  CBC     Status: Abnormal   Collection Time: 08/24/14  5:20 AM  Result Value Ref Range   WBC 15.7 (H) 4.0 - 10.5 K/uL   RBC 2.60 (L) 3.87 - 5.11 MIL/uL   Hemoglobin 7.6 (L) 12.0 - 15.0 g/dL   HCT 25.0 (L) 36.0 - 46.0 %   MCV 96.2 78.0 - 100.0 fL   MCH 29.2 26.0 - 34.0 pg   MCHC 30.4 30.0 - 36.0 g/dL   RDW 21.5 (H) 11.5 - 15.5 %   Platelets 309 150 - 400 K/uL  Triglycerides     Status: None   Collection Time: 08/24/14  5:20 AM  Result Value Ref Range   Triglycerides 131 <150 mg/dL  Glucose, capillary     Status: Abnormal   Collection Time: 08/24/14  6:05 AM  Result Value Ref Range   Glucose-Capillary 136 (H) 65 - 99 mg/dL  Troponin I (q 6hr x 3)     Status: None   Collection Time: 08/24/14  6:30 AM  Result Value Ref Range   Troponin I 0.03 <0.031 ng/mL    Comment:        NO INDICATION OF MYOCARDIAL INJURY.   Glucose, capillary     Status: Abnormal   Collection Time: 08/24/14  4:31 PM  Result Value Ref Range   Glucose-Capillary 139 (H) 65 - 99 mg/dL  Glucose, capillary     Status: Abnormal   Collection Time: 08/25/14  1:10 AM  Result Value Ref Range   Glucose-Capillary 122 (H) 65 - 99 mg/dL  CBC     Status: Abnormal   Collection Time:  08/25/14  5:25 AM  Result Value Ref Range   WBC 13.6 (H) 4.0 - 10.5 K/uL   RBC 2.67 (L) 3.87 - 5.11 MIL/uL   Hemoglobin 7.9 (L) 12.0 - 15.0 g/dL   HCT 25.6 (L) 36.0 - 46.0 %   MCV 95.9 78.0 - 100.0 fL   MCH 29.6 26.0 - 34.0 pg    MCHC 30.9 30.0 - 36.0 g/dL   RDW 22.1 (H) 11.5 - 15.5 %   Platelets 357 150 - 400 K/uL  Comprehensive metabolic panel     Status: Abnormal   Collection Time: 08/25/14  5:25 AM  Result Value Ref Range   Sodium 138 135 - 145 mmol/L   Potassium 4.5 3.5 - 5.1 mmol/L   Chloride 101 101 - 111 mmol/L   CO2 28 22 - 32 mmol/L   Glucose, Bld 83 65 - 99 mg/dL   BUN 34 (H) 6 - 20 mg/dL   Creatinine, Ser 5.10 (H) 0.44 - 1.00 mg/dL   Calcium 8.3 (L) 8.9 - 10.3 mg/dL   Total Protein 6.7 6.5 - 8.1 g/dL   Albumin 2.0 (L) 3.5 - 5.0 g/dL   AST 17 15 - 41 U/L   ALT 9 (L) 14 - 54 U/L   Alkaline Phosphatase 127 (H) 38 - 126 U/L   Total Bilirubin 1.3 (H) 0.3 - 1.2 mg/dL   GFR calc non Af Amer 8 (L) >60 mL/min   GFR calc Af Amer 10 (L) >60 mL/min    Comment: (NOTE) The eGFR has been calculated using the CKD EPI equation. This calculation has not been validated in all clinical situations. eGFR's persistently <60 mL/min signify possible Chronic Kidney Disease.    Anion gap 9 5 - 15  Renal function panel     Status: Abnormal   Collection Time: 08/25/14  7:47 AM  Result Value Ref Range   Sodium 139 135 - 145 mmol/L   Potassium 4.5 3.5 - 5.1 mmol/L   Chloride 100 (L) 101 - 111 mmol/L   CO2 27 22 - 32 mmol/L   Glucose, Bld 90 65 - 99 mg/dL   BUN 34 (H) 6 - 20 mg/dL   Creatinine, Ser 5.05 (H) 0.44 - 1.00 mg/dL   Calcium 8.1 (L) 8.9 - 10.3 mg/dL   Phosphorus 6.1 (H) 2.5 - 4.6 mg/dL   Albumin 2.1 (L) 3.5 - 5.0 g/dL   GFR calc non Af Amer 9 (L) >60 mL/min   GFR calc Af Amer 10 (L) >60 mL/min    Comment: (NOTE) The eGFR has been calculated using the CKD EPI equation. This calculation has not been validated in all clinical situations. eGFR's persistently <60 mL/min signify possible Chronic Kidney Disease.    Anion gap 12 5 - 15    Imaging / Studies: Dg Chest Port 1 View  08/23/2014   CLINICAL DATA:  Left chest pain  EXAM: PORTABLE CHEST - 1 VIEW  COMPARISON:  08/18/2014  FINDINGS: Patient is  rotated.  Lungs are essentially clear. No focal consolidation. No pleural effusion or pneumothorax.  Cardiomegaly.  Left IJ venous catheter terminates at the cavoatrial junction. Right IJ dual lumen dialysis catheter terminates in the lower SVC.  IMPRESSION: No evidence of acute cardiopulmonary disease.   Electronically Signed   By: Julian Hy M.D.   On: 08/23/2014 19:17   Dg Abd Portable 1v  08/23/2014   CLINICAL DATA:  JP drain, broken, initial encounter  EXAM: PORTABLE ABDOMEN - 1 VIEW  COMPARISON:  CT  abdomen pelvis dated 08/21/2014  FINDINGS: A portion of the right flank is excluded from this single image.  The patient's right abdominal surgical drain is not visualized.  Gastrojejunostomy in satisfactory position.  IMPRESSION: The patient's right abdominal surgical drain is not visualized.  Gastrojejunostomy in satisfactory position.   Electronically Signed   By: Julian Hy M.D.   On: 08/23/2014 19:11    Medications / Allergies:  Scheduled Meds: . ALPRAZolam      . antiseptic oral rinse  7 mL Mouth Rinse q12n4p  . budesonide (PULMICORT) nebulizer solution  0.25 mg Nebulization BID  . cefTAZidime (FORTAZ)  IV  2 g Intravenous Q T,Th,Sa-HD  . chlorhexidine  15 mL Mouth Rinse BID  . darbepoetin (ARANESP) injection - DIALYSIS  100 mcg Intravenous Q Thu-HD  . feeding supplement (VITAL AF 1.2 CAL)  1,000 mL Per Tube Q24H  . insulin aspart  0-9 Units Subcutaneous Q8H  . ipratropium-albuterol      . levothyroxine  12.5 mcg Intravenous Daily  . metoprolol  2.5 mg Intravenous 4 times per day  . metronidazole  500 mg Intravenous 3 times per day  . sodium chloride  10-40 mL Intracatheter Q12H   Continuous Infusions: . Marland KitchenTPN (CLINIMIX-E) Adult 30 mL/hr at 08/24/14 1708   And  . fat emulsion 240 mL (08/24/14 1708)  . sodium chloride 10 mL/hr at 08/19/14 0950   PRN Meds:.bisacodyl, camphor-menthol, diphenhydrAMINE, diphenhydrAMINE-zinc acetate, HYDROmorphone (DILAUDID) injection,  levalbuterol, ondansetron (ZOFRAN) IV, sodium chloride  Antibiotics: Anti-infectives    Start     Dose/Rate Route Frequency Ordered Stop   08/21/14 1430  cefTAZidime (FORTAZ) 1 g in dextrose 5 % 50 mL IVPB     1 g 100 mL/hr over 30 Minutes Intravenous  Once 08/21/14 1426 08/21/14 1547   08/18/14 1300  metroNIDAZOLE (FLAGYL) IVPB 500 mg     500 mg 100 mL/hr over 60 Minutes Intravenous 3 times per day 08/18/14 1045     08/18/14 1200  cefTAZidime (FORTAZ) 2 g in dextrose 5 % 50 mL IVPB     2 g 100 mL/hr over 30 Minutes Intravenous Every T-Th-Sa (Hemodialysis) 08/18/14 1054     08/12/14 1449  ceFAZolin (ANCEF) 2-3 GM-% IVPB SOLR    Comments:  Shaaron Adler   : cabinet override      08/12/14 1449 08/12/14 1629   08/12/14 1130  ceFAZolin (ANCEF) IVPB 2 g/50 mL premix    Comments:  Hang ON CALL to xray 5/11   2 g 100 mL/hr over 30 Minutes Intravenous To Radiology 08/12/14 1035 08/12/14 1520   08/04/14 1200  vancomycin (VANCOCIN) 50 mg/mL oral solution 125 mg  Status:  Discontinued     125 mg Oral 4 times per day 08/04/14 0851 08/08/14 1229   07/18/14 1800  vancomycin (VANCOCIN) 50 mg/mL oral solution 125 mg  Status:  Discontinued     125 mg Oral 4 times per day 07/18/14 1455 08/04/14 0845   07/17/14 1830  vancomycin (VANCOCIN) IVPB 750 mg/150 ml premix     750 mg 150 mL/hr over 60 Minutes Intravenous  Once 07/17/14 1818 07/18/14 0024   07/15/14 1400  metroNIDAZOLE (FLAGYL) tablet 500 mg  Status:  Discontinued     500 mg Oral 3 times per day 07/15/14 1202 07/18/14 1533   07/13/14 2200  piperacillin-tazobactam (ZOSYN) IVPB 2.25 g  Status:  Discontinued     2.25 g 100 mL/hr over 30 Minutes Intravenous 3 times per day 07/13/14 1318 07/24/14 1138  07/13/14 2000  fluconazole (DIFLUCAN) IVPB 200 mg  Status:  Discontinued     200 mg 100 mL/hr over 60 Minutes Intravenous Every 24 hours 07/13/14 1318 07/19/14 1027   07/07/14 1200  vancomycin (VANCOCIN) IVPB 1000 mg/200 mL premix  Status:   Discontinued     1,000 mg 200 mL/hr over 60 Minutes Intravenous Every 24 hours 07/07/14 0937 07/14/14 1712   07/06/14 2000  fluconazole (DIFLUCAN) IVPB 400 mg  Status:  Discontinued     400 mg 100 mL/hr over 120 Minutes Intravenous Every 24 hours 07/06/14 1507 07/13/14 1318   07/06/14 1800  piperacillin-tazobactam (ZOSYN) IVPB 2.25 g  Status:  Discontinued     2.25 g 100 mL/hr over 30 Minutes Intravenous 4 times per day 07/06/14 1505 07/13/14 1318   07/05/14 2000  fluconazole (DIFLUCAN) IVPB 200 mg  Status:  Discontinued     200 mg 100 mL/hr over 60 Minutes Intravenous Every 24 hours 07/05/14 0757 07/06/14 1507   07/05/14 1400  piperacillin-tazobactam (ZOSYN) IVPB 2.25 g  Status:  Discontinued     2.25 g 100 mL/hr over 30 Minutes Intravenous 3 times per day 07/05/14 0749 07/06/14 1505   07/02/14 1800  vancomycin (VANCOCIN) IVPB 1000 mg/200 mL premix  Status:  Discontinued     1,000 mg 200 mL/hr over 60 Minutes Intravenous Every 24 hours 07/01/14 1858 07/05/14 0801   07/01/14 2200  piperacillin-tazobactam (ZOSYN) IVPB 3.375 g  Status:  Discontinued     3.375 g 100 mL/hr over 30 Minutes Intravenous 4 times per day 07/01/14 1858 07/05/14 0749   07/01/14 2000  fluconazole (DIFLUCAN) IVPB 400 mg  Status:  Discontinued     400 mg 100 mL/hr over 120 Minutes Intravenous Every 24 hours 07/01/14 1858 07/05/14 0757   07/01/14 1400  fluconazole (DIFLUCAN) IVPB 200 mg  Status:  Discontinued     200 mg 100 mL/hr over 60 Minutes Intravenous Every 24 hours 07/01/14 1330 07/01/14 1853   07/01/14 1000  piperacillin-tazobactam (ZOSYN) IVPB 2.25 g  Status:  Discontinued     2.25 g 100 mL/hr over 30 Minutes Intravenous Every 8 hours 07/01/14 0952 07/01/14 1853   07/01/14 1000  vancomycin (VANCOCIN) 500 mg in sodium chloride 0.9 % 100 mL IVPB     500 mg 100 mL/hr over 60 Minutes Intravenous  Once 07/01/14 0952 07/01/14 1126   06/30/14 1200  vancomycin (VANCOCIN) IVPB 1000 mg/200 mL premix     1,000  mg 200 mL/hr over 60 Minutes Intravenous  Once 06/30/14 0944 06/30/14 1403   06/30/14 1200  ceFEPIme (MAXIPIME) 2 g in dextrose 5 % 50 mL IVPB     2 g 100 mL/hr over 30 Minutes Intravenous  Once 06/30/14 0944 06/30/14 1425   06/30/14 0100  vancomycin (VANCOCIN) 2,000 mg in sodium chloride 0.9 % 500 mL IVPB     2,000 mg 250 mL/hr over 120 Minutes Intravenous  Once 06/30/14 0048 06/30/14 0525   06/30/14 0100  ceFEPIme (MAXIPIME) 2 g in dextrose 5 % 50 mL IVPB     2 g 100 mL/hr over 30 Minutes Intravenous  Once 06/30/14 0048 06/30/14 0322   06/27/14 0600  cefUROXime (ZINACEF) 1.5 g in dextrose 5 % 50 mL IVPB     1.5 g 100 mL/hr over 30 Minutes Intravenous On call to O.R. 06/26/14 1019 06/27/14 0630   06/19/14 1800  oseltamivir (TAMIFLU) capsule 30 mg     30 mg Oral Every M-W-F (1800) 06/19/14 1003 06/22/14 1841  06/18/14 1600  oseltamivir (TAMIFLU) capsule 30 mg  Status:  Discontinued     30 mg Oral Daily 06/18/14 1538 06/19/14 1003       Assessment/Plan: POD #55 s/p Exploratory Laparotomy with graham patch closure of perforated pyloric ulcer, SBR for ischemic bowel - 07/01/2014 - Dalbert Batman  Intra-abdominal (pelvic) fluid collection Duodenal fistula -CT guided aspiration CX PSEUDOMONAS AERUGINOSA--back on flagyl/fortaz -advance TF slowly to 58m/hr, check residuals q6h, d/w nursing -fistula controlled by red rubber, output has increased since octreotide was stopped yesterday -apply abdominal binder to prevent pt from pulling at tubed -NPO -BID wet to dry dressing changes -CT 5/20 showed a decrease in pelvic fluid collection(pt pulled out perc drain) ID-Cipro/Flagyl per ID. C diff colitis New ESRD PCM-prealbumin 9.5, increasing TF, will ask RD to clarify goal rate and stop TPN when she's at goal.  EErby Pian ANortheast [facility] HospitalSurgery Pager [phone](7A-4:30P) For consults and floor pages call [phone](7A-4:30P)  08/25/2014 9:21 AM

## 2014-08-25 NOTE — Progress Notes (Signed)
INFECTIOUS DISEASE PROGRESS NOTE  ID: [name] is a 59 y.o. female with  Principal Problem:   Acute respiratory failure with hypoxia Active Problems:   Cerebral thrombosis with cerebral infarction   Hypertension   ESRD needing dialysis   Obesity (BMI 30-39.9)   Depression   Left renal mass   Chronic diastolic heart failure   H1N1 influenza   Tobacco use disorder   Renal failure (ARF), acute on chronic   Hyperlipidemia   PSVT (paroxysmal supraventricular tachycardia)   SOB (shortness of breath)   Perforated gastric ulcer   Abnormal TSH   History of intermediate V/Q scan   HIT (heparin-induced thrombocytopenia)   Fistula   Abdominal pain   Palliative care encounter   ESRD (end stage renal disease)   SVT (supraventricular tachycardia)   Right heart failure   Duodenal anastomotic leak   Protein calorie malnutrition   Itching   Aortic aneurysm   Protein-calorie malnutrition  Subjective: C/o abd pain.  S/p normal, soft BM.   Abtx:  Anti-infectives    Start     Dose/Rate Route Frequency Ordered Stop   08/21/14 1430  cefTAZidime (FORTAZ) 1 g in dextrose 5 % 50 mL IVPB     1 g 100 mL/hr over 30 Minutes Intravenous  Once 08/21/14 1426 08/21/14 1547   08/18/14 1300  metroNIDAZOLE (FLAGYL) IVPB 500 mg     500 mg 100 mL/hr over 60 Minutes Intravenous 3 times per day 08/18/14 1045     08/18/14 1200  cefTAZidime (FORTAZ) 2 g in dextrose 5 % 50 mL IVPB     2 g 100 mL/hr over 30 Minutes Intravenous Every T-Th-Sa (Hemodialysis) 08/18/14 1054     08/12/14 1449  ceFAZolin (ANCEF) 2-3 GM-% IVPB SOLR    Comments:  Troy SineLaffan, Eileen   : cabinet override      08/12/14 1449 08/12/14 1629   08/12/14 1130  ceFAZolin (ANCEF) IVPB 2 g/50 mL premix    Comments:  Hang ON CALL to xray 5/11   2 g 100 mL/hr over 30 Minutes Intravenous To Radiology 08/12/14 1035 08/12/14 1520   08/04/14 1200  vancomycin (VANCOCIN) 50 mg/mL oral solution 125 mg  Status:  Discontinued     125 mg Oral 4  times per day 08/04/14 0851 08/08/14 1229   07/18/14 1800  vancomycin (VANCOCIN) 50 mg/mL oral solution 125 mg  Status:  Discontinued     125 mg Oral 4 times per day 07/18/14 1455 08/04/14 0845   07/17/14 1830  vancomycin (VANCOCIN) IVPB 750 mg/150 ml premix     750 mg 150 mL/hr over 60 Minutes Intravenous  Once 07/17/14 1818 07/18/14 0024   07/15/14 1400  metroNIDAZOLE (FLAGYL) tablet 500 mg  Status:  Discontinued     500 mg Oral 3 times per day 07/15/14 1202 07/18/14 1533   07/13/14 2200  piperacillin-tazobactam (ZOSYN) IVPB 2.25 g  Status:  Discontinued     2.25 g 100 mL/hr over 30 Minutes Intravenous 3 times per day 07/13/14 1318 07/24/14 1138   07/13/14 2000  fluconazole (DIFLUCAN) IVPB 200 mg  Status:  Discontinued     200 mg 100 mL/hr over 60 Minutes Intravenous Every 24 hours 07/13/14 1318 07/19/14 1027   07/07/14 1200  vancomycin (VANCOCIN) IVPB 1000 mg/200 mL premix  Status:  Discontinued     1,000 mg 200 mL/hr over 60 Minutes Intravenous Every 24 hours 07/07/14 0937 07/14/14 1712   07/06/14 2000  fluconazole (DIFLUCAN) IVPB 400 mg  Status:  Discontinued     400 mg 100 mL/hr over 120 Minutes Intravenous Every 24 hours 07/06/14 1507 07/13/14 1318   07/06/14 1800  piperacillin-tazobactam (ZOSYN) IVPB 2.25 g  Status:  Discontinued     2.25 g 100 mL/hr over 30 Minutes Intravenous 4 times per day 07/06/14 1505 07/13/14 1318   07/05/14 2000  fluconazole (DIFLUCAN) IVPB 200 mg  Status:  Discontinued     200 mg 100 mL/hr over 60 Minutes Intravenous Every 24 hours 07/05/14 0757 07/06/14 1507   07/05/14 1400  piperacillin-tazobactam (ZOSYN) IVPB 2.25 g  Status:  Discontinued     2.25 g 100 mL/hr over 30 Minutes Intravenous 3 times per day 07/05/14 0749 07/06/14 1505   07/02/14 1800  vancomycin (VANCOCIN) IVPB 1000 mg/200 mL premix  Status:  Discontinued     1,000 mg 200 mL/hr over 60 Minutes Intravenous Every 24 hours 07/01/14 1858 07/05/14 0801   07/01/14 2200   piperacillin-tazobactam (ZOSYN) IVPB 3.375 g  Status:  Discontinued     3.375 g 100 mL/hr over 30 Minutes Intravenous 4 times per day 07/01/14 1858 07/05/14 0749   07/01/14 2000  fluconazole (DIFLUCAN) IVPB 400 mg  Status:  Discontinued     400 mg 100 mL/hr over 120 Minutes Intravenous Every 24 hours 07/01/14 1858 07/05/14 0757   07/01/14 1400  fluconazole (DIFLUCAN) IVPB 200 mg  Status:  Discontinued     200 mg 100 mL/hr over 60 Minutes Intravenous Every 24 hours 07/01/14 1330 07/01/14 1853   07/01/14 1000  piperacillin-tazobactam (ZOSYN) IVPB 2.25 g  Status:  Discontinued     2.25 g 100 mL/hr over 30 Minutes Intravenous Every 8 hours 07/01/14 0952 07/01/14 1853   07/01/14 1000  vancomycin (VANCOCIN) 500 mg in sodium chloride 0.9 % 100 mL IVPB     500 mg 100 mL/hr over 60 Minutes Intravenous  Once 07/01/14 0952 07/01/14 1126   06/30/14 1200  vancomycin (VANCOCIN) IVPB 1000 mg/200 mL premix     1,000 mg 200 mL/hr over 60 Minutes Intravenous  Once 06/30/14 0944 06/30/14 1403   06/30/14 1200  ceFEPIme (MAXIPIME) 2 g in dextrose 5 % 50 mL IVPB     2 g 100 mL/hr over 30 Minutes Intravenous  Once 06/30/14 0944 06/30/14 1425   06/30/14 0100  vancomycin (VANCOCIN) 2,000 mg in sodium chloride 0.9 % 500 mL IVPB     2,000 mg 250 mL/hr over 120 Minutes Intravenous  Once 06/30/14 0048 06/30/14 0525   06/30/14 0100  ceFEPIme (MAXIPIME) 2 g in dextrose 5 % 50 mL IVPB     2 g 100 mL/hr over 30 Minutes Intravenous  Once 06/30/14 0048 06/30/14 0322   06/27/14 0600  cefUROXime (ZINACEF) 1.5 g in dextrose 5 % 50 mL IVPB     1.5 g 100 mL/hr over 30 Minutes Intravenous On call to O.R. 06/26/14 1019 06/27/14 0630   06/19/14 1800  oseltamivir (TAMIFLU) capsule 30 mg     30 mg Oral Every M-W-F (1800) 06/19/14 1003 06/22/14 1841   06/18/14 1600  oseltamivir (TAMIFLU) capsule 30 mg  Status:  Discontinued     30 mg Oral Daily 06/18/14 1538 06/19/14 1003      Medications:  Scheduled: . antiseptic oral  rinse  7 mL Mouth Rinse q12n4p  . budesonide (PULMICORT) nebulizer solution  0.25 mg Nebulization BID  . cefTAZidime (FORTAZ)  IV  2 g Intravenous Q T,Th,Sa-HD  . chlorhexidine  15 mL Mouth Rinse BID  . darbepoetin (  ARANESP) injection - DIALYSIS  100 mcg Intravenous Q Thu-HD  . feeding supplement (VITAL AF 1.2 CAL)  1,000 mL Per Tube Q24H  . ipratropium-albuterol      . levothyroxine  12.5 mcg Intravenous Daily  . metoprolol  2.5 mg Intravenous 4 times per day  . metronidazole  500 mg Intravenous 3 times per day  . sodium chloride  10-40 mL Intracatheter Q12H    Objective: Vital signs in last 24 hours: Temp:  [97.8 F (36.6 C)-98.7 F (37.1 C)] 98.4 F (36.9 C) (05/24 1122) Pulse Rate:  [102-154] 118 (05/24 1443) Resp:  [14-30] 22 (05/24 1122) BP: (86-118)/(42-68) 104/50 mmHg (05/24 1122) SpO2:  [93 %-100 %] 95 % (05/24 1443) Weight:  [98.4 kg (216 lb 14.9 oz)-99.202 kg (218 lb 11.2 oz)] 98.4 kg (216 lb 14.9 oz) (05/24 0710)   General appearance: alert, cooperative and mild distress Resp: rhonchi anterior - bilateral Cardio: regular rate and rhythm GI: normal findings: bowel sounds normal and soft, non-tender  Lab Results  Recent Labs  08/24/14 0520 08/25/14 0525 08/25/14 0747  WBC 15.7* 13.6*  --   HGB 7.6* 7.9*  --   HCT 25.0* 25.6*  --   NA  --  138 139  K  --  4.5 4.5  CL  --  101 100*  CO2  --  28 27  BUN  --  34* 34*  CREATININE  --  5.10* 5.05*   Liver Panel  Recent Labs  08/25/14 0525 08/25/14 0747  PROT 6.7  --   ALBUMIN 2.0* 2.1*  AST 17  --   ALT 9*  --   ALKPHOS 127*  --   BILITOT 1.3*  --    Sedimentation Rate No results for input(s): ESRSEDRATE in the last 72 hours. C-Reactive Protein No results for input(s): CRP in the last 72 hours.  Microbiology: Recent Results (from the past 240 hour(s))  Culture, routine-abscess     Status: None   Collection Time: 08/17/14  2:51 PM  Result Value Ref Range Status   Specimen Description ABSCESS  PELVIS  Final   Special Requests NONE  Final   Gram Stain   Final    FEW WBC PRESENT,BOTH PMN AND MONONUCLEAR NO SQUAMOUS EPITHELIAL CELLS SEEN NO ORGANISMS SEEN Performed at [facility]    Culture   Final    NO GROWTH 3 DAYS Performed at [facility]    Report Status 08/21/2014 FINAL  Final    Studies/Results: Dg Chest Port 1 View  08/23/2014   CLINICAL DATA:  Left chest pain  EXAM: PORTABLE CHEST - 1 VIEW  COMPARISON:  08/18/2014  FINDINGS: Patient is rotated.  Lungs are essentially clear. No focal consolidation. No pleural effusion or pneumothorax.  Cardiomegaly.  Left IJ venous catheter terminates at the cavoatrial junction. Right IJ dual lumen dialysis catheter terminates in the lower SVC.  IMPRESSION: No evidence of acute cardiopulmonary disease.   Electronically Signed   By: Charline Bills M.D.   On: 08/23/2014 19:17   Dg Abd Portable 1v  08/23/2014   CLINICAL DATA:  JP drain, broken, initial encounter  EXAM: PORTABLE ABDOMEN - 1 VIEW  COMPARISON:  CT abdomen pelvis dated 08/21/2014  FINDINGS: A portion of the right flank is excluded from this single image.  The patient's right abdominal surgical drain is not visualized.  Gastrojejunostomy in satisfactory position.  IMPRESSION: The patient's right abdominal surgical drain is not visualized.  Gastrojejunostomy in satisfactory position.  Electronically Signed   By: Charline Bills M.D.   On: 08/23/2014 19:11     Assessment/Plan: Wound infection Abscess 5-14 pseudomonas Perforated pyloric ulcer, ? Leak on CT Ischemic necrosis ileum COPD CKD, ESRD on HD HTN Protein calorie malnutrition, severe C diff (4-12)  +HIT  Total days of antibiotics: 8 ceftaz/flagyl  No change in anbx Watch her wound WBC better Nutrition Having nl BM.  Continue to hold off on CT abd/pelvis, last done 5-20         Johny Sax Infectious Diseases (pager) [phone] [email or web address] 08/25/2014, 3:22  PM  LOS: 69 days

## 2014-08-25 NOTE — Progress Notes (Signed)
MD prescribed medication for anxiety. Patient sad, tearful and anxious. States "she does not know what is wrong."

## 2014-08-25 NOTE — Progress Notes (Signed)
Physical Therapy Treatment Patient Details Name: [name] MRN: [id number] DOB: 11-19-1955 Today's Date: 08/25/2014    History of Present Illness [name] is a 59 y.o. female with a history of CKD and hypertension [facility]16 complaining of shortness of breath and generalized weakness x 2 weeks. Admitted with acute hypoxemic respiratory failure. During hospital stay noted to have weakness of her left side. 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  She developed septic shock 4/1 with pressors weaned off 4/3.  Began CRRT and pressors restarted 4/4; Developed pelvic abcess 4/7 with pelvic drain placed 4/8.  weaned off pressors 4/9.  Runs of SVT 4/5 and 4/11. Prolonged hospital course with hypoxemia and hypotension    PT Comments    Pt fatigued from HD with periodic runs of Vtach non-sustained since HD which RN and pt report as occurring with each session. Pt with HR 104 on arrival and 118 end of session with brief 8 sec jump to 135-158 with gait with gait limited secondary to above. Pt encouraged to continue mobilizing with nursing and performing HEP. In standing assisted RN to don abdominal binder.   Follow Up Recommendations  SNF     Equipment Recommendations       Recommendations for Other Services       Precautions / Restrictions Precautions Precautions: Fall Precaution Comments: 2 abdominal drains to foley bags, G tube    Mobility  Bed Mobility Overal bed mobility: Needs Assistance Bed Mobility: Supine to Sit     Supine to sit: Min assist;HOB elevated     General bed mobility comments: cues for sequence, min assist to bring LLE to EOB and assist to scoot fully to EOB with use of pad  Transfers Overall transfer level: Needs assistance   Transfers: Sit to/from Stand Sit to Stand: Min assist;Mod assist         General  transfer comment: cues for hand placement, sequence and powering up with initial mod assist first trial then min assist from bed repeated 3 trials with increased time  Ambulation/Gait Ambulation/Gait assistance: Min assist;+2 safety/equipment Ambulation Distance (Feet): 7 Feet Assistive device: Rolling walker (2 wheeled) Gait Pattern/deviations: Step-to pattern;Narrow base of support   Gait velocity interpretation: Below normal speed for age/gender General Gait Details: limited distance today due to periodic increased HR after HD non-sustained and unable to progress ambulation today. Pt with cues for posture, position in RW and safety particularly with backing to recliner and control of descent. Cues for increased step length and BOS   Stairs            Wheelchair Mobility    Modified Rankin (Stroke Patients Only)       Balance                                    Cognition Arousal/Alertness: Awake/alert Behavior During Therapy: Flat affect Overall Cognitive Status: Within Functional Limits for tasks assessed                      Exercises General Exercises - Lower Extremity Long Arc Quad: AROM;Both;10 reps;Seated Hip Flexion/Marching: AROM;AAROM;Seated;Both;10 reps (AAROM on LLE)    General Comments        Pertinent Vitals/Pain Pain Score: 3  Pain Location: abdomen Pain Descriptors / Indicators: Tender Pain Intervention(s): Repositioned    Home Living                      Prior Function            PT Goals (current goals can now be found in the care plan section) Progress towards PT goals: Progressing toward goals    Frequency       PT Plan Current plan remains appropriate    Co-evaluation             End of Session Equipment Utilized During Treatment: Gait belt;Oxygen Activity Tolerance: Patient tolerated treatment well Patient left: in chair;with call bell/phone within reach;with nursing/sitter in room      Time: 1610-9604 PT Time Calculation (min) (ACUTE ONLY): 31 min  Charges:  $Gait Training: 8-22 mins $Therapeutic Exercise: 8-22 mins                    G Codes:      Delorse Lek 2014/09/10, 2:47 PM Delaney Meigs, PT [phone]

## 2014-08-25 NOTE — Progress Notes (Signed)
[place] for TPN Indication:  High output duodenal Fistula  Allergies  Allergen Reactions  . 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    Patient Measurements: Height: 5\' 6"  (167.6 cm) Weight: 216 lb 14.9 oz (98.4 kg) IBW/kg (Calculated) : 59.3 Adjusted Body Weight: 71.2kg  Vital Signs: Temp: 98.7 F (37.1 C) (05/24 0710) Temp Source: Oral (05/24 0710) BP: 100/67 mmHg (05/24 0855) Pulse Rate: 131 (05/24 0855) Intake/Output from previous day: 05/23 0701 - 05/24 0700 In: 2082.3 [I.V.:300.8; NG/GT:314.8; IV Piggyback:100; TPN:1366.7] Out: 2450 [Drains:2450]  Labs:  Recent Labs  08/23/14 0415 08/24/14 0520 08/25/14 0525  WBC 15.3* 15.7* 13.6*  HGB 7.8* 7.6* 7.9*  HCT 26.2* 25.0* 25.6*  PLT 307 309 357    Recent Labs  08/23/14 0415 08/24/14 0520 08/25/14 0525 08/25/14 0747  NA 137  --  138 139  K 4.3  --  4.5 4.5  CL 99*  --  101 100*  CO2 30  --  28 27  GLUCOSE 120*  --  83 90  BUN 14  --  34* 34*  CREATININE 2.43*  --  5.10* 5.05*  CALCIUM 8.3*  --  8.3* 8.1*  MG 2.0 2.0  --   --   PHOS 3.3 5.0*  --  6.1*  PROT  --   --  6.7  --   ALBUMIN  --   --  2.0* 2.1*  AST  --   --  17  --   ALT  --   --  9*  --   ALKPHOS  --   --  127*  --   BILITOT  --   --  1.3*  --   PREALBUMIN  --  9.7*  --   --   TRIG  --  131  --   --    Estimated Creatinine Clearance: 14.4 mL/min (by C-G formula based on Cr of 5.05).   Recent Labs  08/24/14 0605 08/24/14 1631 08/25/14 0110  GLUCAP 136* 139* 122*   Insulin Requirements in the past 24 hours:  2 units Novolog SSI   Current Nutrition:  Clinimix E 5/15 at 38ml/hr and 20% IVFE at 75ml/hr provides 991 kcal and 31 gm protein per day Vital AF 1.2 at 3ml/hr provides 864 cal + 54gm protein  Nutritional Goals: per RD assessment 5/18 2100-2300 kCal, 120-140 grams of protein per day  Assessment: 25 YOF with complicated hospital course. S/p closure perforated pyloric ulcer, SBR for ischemic  necrosis, diffuse peritonitis on 07/01/14. Pharmacy consulted to provide TPN 4/1-4/15 for nutrition support for prolonged ileus. Pt then transitioned to po diet + supplements. 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  SW:NIOEVOJJ fistula. NPO.Open abd wound, (+)abd pain.Octreotide drip 5/10-5/24. Prealbumin remains low at 9.7 but trending up. Pt with Vital AF 1.2 at 30 ml/hr with no residuals. Drain o/p (R abd bulb + gastrostomy) up to 2450 ml/24h - will need to watch drain o/p. Plan to increase to 64ml/hr today.   Vit C level 0.2 (5/11) - adding 1gm extra to TPN since 5/18 (plan for 14 days total). Repeat Vit C level 5/23 - pending.  Endo:Hx DM/hypothyroidism - CBGs well controlled on SSI alone, synthroid (new), TSH 7.3  Lytes: Lytes being added and removed in the TPN bag as needed based on labs. K 4.5, Phos up to 6.1, Corr Ca 9.6 (Ca x Phos product = 58; goal <55). Will take lytes out of  TPN today.   Renal: New ESRD - started 3/17. HD on TTS. NS KVO, got extra HD 5/20 as fluid has been difficult to manage. Renal requested that TPN + lipids rate be reduced to 21mL/hr to assist with volume management.  Heme: Anemia of ESRD. Hgb low but stable, plt ok. aranesp  Pulm: Hx asthma/COPD. 3L [place]. CT negative for PE  Cards: HTN, afib - IV metoprolol scheduled. HR 107-131(ST), BP ok. CHADS-VASc =4 (not good candidate for [facility]). 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   Hepatotobil: Alk phos elevated but trending down, T bili up to 1.3. AST/ALT wnl. TG wnl  Neuro: Hx depression / insomnia - new stroke noted on 3/18.   ID: Afebrile, WBC 13.6-trending down, s/p treatment for CDiff and s/p multiple courses of abx during admission. Hep B Ag negative. Currently on Flagyl and Ceftaz for pseudomonal wound infection. ID following.  5/14 wound cx (abdomen): pseudomonas   5/17 ceftazidime >> 5/17 flagyl >>  Best Practices: SCDs, PPI IV  TPN Access: PICC  line in IR 4/27  TPN start date: 4/1 >> 4/15; restart 4/27>>  Plan:  - Change Clinimix to plain 5/15 (electrolyte free formula) at 7ml/hr. Will d/c lipids as pt is receiving lipids in tube feeds and this will allow [place] to better meet pt's protein needs. Continue same total hourly rate of fluid = 39ml/hr. TPN provides 682 kcal + 48gm protein. Vital AF at 87ml/hr will provide 1008 kcal + 63gm protein. Total of 1690 kcal/day + 111 gm protein/day which meets 92% protein goal and 80% kcal goal. - Continue MVI + TE in TPN - D/c sensitive SSI and CBGs - Continue ascorbic acid 1gm in TPN - MD wants a total of 14 days (started in TPN 5/18 so today is D#7/14) - will need to reorder if TPN is discontinued - F/u tolerance of tube feeds and ability to continue to titrate up to goal and discontinue TPN  Sherlon Handing, PharmD, BCPS Clinical pharmacist, pager [phone]  08/25/2014 9:15 AM

## 2014-08-26 ENCOUNTER — Inpatient Hospital Stay (HOSPITAL_COMMUNITY): Payer: Self-pay | Admitting: Occupational Therapy

## 2014-08-26 ENCOUNTER — Inpatient Hospital Stay (HOSPITAL_COMMUNITY): Payer: Self-pay

## 2014-08-26 DIAGNOSIS — D7582 Heparin induced thrombocytopenia (HIT): Secondary | ICD-10-CM

## 2014-08-26 DIAGNOSIS — A419 Sepsis, unspecified organism: Secondary | ICD-10-CM | POA: Insufficient documentation

## 2014-08-26 LAB — CBC
HCT: 24.1 % — ABNORMAL LOW (ref 36.0–46.0)
Hemoglobin: 7.1 g/dL — ABNORMAL LOW (ref 12.0–15.0)
MCH: 28.6 pg (ref 26.0–34.0)
MCHC: 29.5 g/dL — ABNORMAL LOW (ref 30.0–36.0)
MCV: 97.2 fL (ref 78.0–100.0)
Platelets: 327 10*3/uL (ref 150–400)
RBC: 2.48 MIL/uL — ABNORMAL LOW (ref 3.87–5.11)
RDW: 23.3 % — ABNORMAL HIGH (ref 11.5–15.5)
WBC: 13.2 10*3/uL — ABNORMAL HIGH (ref 4.0–10.5)

## 2014-08-26 MED ORDER — VITAL AF 1.2 CAL PO LIQD
1000.0000 mL | ORAL | Status: DC
  Administered 2014-08-26: 1000 mL
  Filled 2014-08-26 (×3): qty 1000

## 2014-08-26 MED ORDER — SODIUM CHLORIDE 0.9 % IV BOLUS (SEPSIS)
500.0000 mL | Freq: Once | INTRAVENOUS | Status: AC
  Administered 2014-08-26: 500 mL via INTRAVENOUS

## 2014-08-26 MED ORDER — TRACE MINERALS CR-CU-F-FE-I-MN-MO-SE-ZN IV SOLN
INTRAVENOUS | Status: AC
  Administered 2014-08-26: 18:00:00 via INTRAVENOUS
  Filled 2014-08-26: qty 960

## 2014-08-26 MED ORDER — DARBEPOETIN ALFA 200 MCG/0.4ML IJ SOSY
200.0000 ug | PREFILLED_SYRINGE | INTRAMUSCULAR | Status: DC
  Administered 2014-09-03: 200 ug via INTRAVENOUS
  Filled 2014-08-26 (×3): qty 0.4

## 2014-08-26 NOTE — Progress Notes (Signed)
PROGRESS NOTE  [name] YNW:[id number] DOB: [birth year] DOA: 06/17/2014 PCP: [name], MD  Assessment/Plan: Status post exploratory laparotomy with closure of perforated pyloric ulcer/abdominal abscess / small bowel resection w/ postop ileus and now with fistula. Pelvic abscess s/p percutaneous drain placement 4/8; had leakage from anastomosis site. Received Zosyn for 23 days. Chronic low grade leukocytosis range 13-15 -IV antibiotics were discontinued by Surgery on 4/22. Also received 18 days of Vancomycin and fluconazole.  -Due to Fistula is back to NPO state, TPN. Trickle feeds being attempted. -Fistula study done shows JP drain connection to the duodenal bulb-5-02. - 08/15/2014 repeat CT abdomen with 3.0 x 3.5 x 5.7 cm collection in the pelvic cul-de-sac and extraluminal air alongside JP drain in anterior abd wall -Drain with old and new blood, Hb trending down, transfuse 2 unit PRBC 5/10 -continues to have high output from Fistula/leak. Had G_J placed by IR 08/12/14 at request of gen surgery -Patient continues to be on octreotide, which is being managed by surgery.  -CT repeat shows 5 cm post cul-de-sac abscess which was drained on 5/16-culture revealed pseudomonas aeruginosa -ID was consulted as patient had Pseudomonas growing from her culture wounds and they started back Flagyl as well as Elita Quick on 5/16 and are following and we await final growth from her new abscess drained on 5/16. -Per ID recommendation--plan 21 days Fortaz and 28 days metronidazole -Patient inadvertently pulled out her gluteal drain May 19. Seen by interventional radiology and they recommended repeat CT scan, which was done. Shows that the fluid collection has decreased in size. They do not plan to replace the drain at this time. Continue with antibiotics alone.  -08/21/2014 CT scan did suggest a transmural defect in the stomach wall near the patient's pyloric Cheree Ditto patch. We will let surgery address  this. -Patient accidentally pulled out her JP drain on 5/22. This has been replaced by surgery on 5/23. -Octreotide was discontinued 5/23--enterocutaneous fistula output approximately 1500 mL in the past 24 hours - Continue with tube feedings. Seems to be tolerating.  No significant residuals  -TPN rate had to be reduced due to fluid overload. Rate of the 2. Feeding was increased 5/24 to 35 mL per hour.  PSVT  -Felt to be secondary to severe anxiety with dialysis  -Has been evaluated by EP.  -Cardiology following peripherally- follow K+ and Mg  -Continue IV metoprolol, now off amiodarone;  -V-Q scan with intermediate probability for pulmonary embolism.  -CTA 4/27, negative for PE  -Heart rate better controlled with scheduled intravenous metoprolol. Monitor blood pressures closely as she does have a tendency to become hypotensive -Chest pain on 5/22, likely related to the fact that JP drain was accidentally pulled out, which resulted in severe abdominal pain. Chest x-ray did not show any concerning findings. EKG was nonischemic. Troponins are unremarkable. Don't anticipate further workup unless she has recurrence of her symptoms. -Patient continues to have intermittent paroxysmal episodes of SVT that are asymptomatic. -Unfortunately, the patient's soft blood pressures have limited use of intravenous metoprolol ESRD new, now on HD this admit complicated by intermittent hypotension AKi on CKD 4, due to sepsis, shock Ongoing hemodialysis per Nephrology - perm-cath placed 3/26 d/t AVF problems Patient AV fistula reviewed and not mature yet per Dr. Paulene Floor need to revisit prior to d/c Rate of TPN was reduced by nephrology due to concerns for fluid overload. -Continue hemodialysis Tuesday, Thursday, Saturday while the patient is in the  hospital  C. difficile colitis still on contact precautions -C diff positive on 4-12, Received 4 days of flagyl without improvement, then started on PO  vanc -Completed 3 weeks of Oral Vanc-stopped 5/7, this was 1 week after IV Zosyn was stopped 5/5 -Diarrhea resolved--stools are firm  -keep contact precautions on board   ACA CVA with Lt sided weakness -TEE ;normal LV function; no LAA thrombus -Started on ASA 81mg  per Neuro -Will need ongoing long term rehab - PT/OT to cont to follow while inpatient -Patient remains very weak and deconditioned. Will potentially need CIR when medically stable  Chronic diastolic congestive heart failure w/ Severe RHF EF 60-65%, grade 1 diastolic dysfunction. Fluids/volume managed with hemodialysis.  Elevated PA pressures on 2-D echo; CTA negative for PE  Small PFO Has been evaluated by Cardiology. No intervention planned currently.  Anemia of critical illness/chronic disease -and bleeding from leak/fistula which has resolved. -s/p 2U PRBC 4/13 and 5/10 Aranesp/iron per renal -hb stable--transfuse for hemoglobin less than 7  Thrombocytopenia; resolved.  Appears to be associated with sepsis Has had mild positive HIT test during this admission   Asthma/COPD Continue oxygen supplementation as needed Will continue pulmicort Continue xopenex -Presently stable without any distress  Acute hypoxic Respiratory Failure:  -This was secondary to H1N1, hypervolemia, HCAP >> resolved.  -also has COPD and PAH -improving, now weaning off O2--presently stable on 2 L nasal cannula -Nebulizer PRN and CPAP QHS   Septic shock  -due to perforated prepyloric ulcer, required pressors -resolved currently  Diabetes mellitus 2 Did have episodes of Hypoglycemia in setting of NPO status. On TPN now, CBGs stabilizedin 120-160s. Hba1c 6.5  Electrolyte imbalance  Being addressed via TPN  Abnormal TSH -Free T4 WNL, T3 1.8 -most likely sick thyroid with ongoing infection and body stress; also due to amiodarone use. TSH in 7 range -continue low dose synthroid given PSVT, now on IV synthroid   L Renal mass   -noted on [place], Indeterminate 1.9 cm hypoechoic mass off the interpolar region of the left kidney -may potentially represent a cyst however solid mass is not excluded -needs outpatient FU for this  Obesity Inadequate oral intake related to altered GI function as evidenced by limited intake, ongoing.  Body mass index is 35.03 kg/(m^2).  Fall on May 18 Examination did not reveal any injuries.   DVT prophylaxis: SCDs Code Status: FULL Family Communication: Discussed with patient. No family at bedside. Disposition Plan: Have been unable to transfer patient out of stepdown unit due to dyspnea and tachycardia. She also has complex wound care needs. Dialysis is ongoing. Not ready for discharge.  Consultants: Cardiology Nephrology PCCM Gen Surgery  Palliative care           Procedures/Studies: Ct Abdomen Pelvis Wo Contrast  08/22/2014   CLINICAL DATA:  Abdominal abscess. No intravenous contrast due to renal failure.  EXAM: CT ABDOMEN AND PELVIS WITHOUT CONTRAST  TECHNIQUE: Multidetector CT imaging of the abdomen and pelvis was performed following the standard protocol without IV contrast.  COMPARISON:  08/15/2014  FINDINGS: BODY WALL: Open laparotomy without fluid collection.  LOWER CHEST: Chronic triangular opacity in the posterior costophrenic sulcus on the left, atelectasis versus scarring  ABDOMEN/PELVIS:  Liver: No focal abnormality.  Biliary: High-density material within the gallbladder showing mild wall thickening which is chronic.  Pancreas: Unremarkable.  Spleen: Unremarkable.  Adrenals: Unremarkable.  Kidneys and ureters: No hydronephrosis or stone. Presumed 1 cm cyst from the interpolar left kidney  Bladder: Decompressed.  No  pathologic findings.  Reproductive: Calcified and noncalcified uterine fibroids, better seen on previous enhanced imaging.  Bowel: Right upper quadrant drain related to perforated pyloric ulcer status post Cheree Ditto patch. Specially visible on coronal  imaging, there is a gas channel through the pylorus measuring 6 to 9 mm in diameter. No bowel obstruction. Enteroenterostomy noted in the pelvis. Oral contrast again seen in the colon. No notable colonic wall thickening in this patient with history of C difficile. Negative appendix. Percutaneous gastrojejunostomy tube is in good position.  Retroperitoneum: No mass or adenopathy.  Peritoneum: Known thick walled collection in the rectovaginal recess smaller than 08/15/2014 at 40 x 16 mm as compared to 51 x 32 mm. No new collection is identified. No free pneumoperitoneum.  Vascular: 3 cm fusiform infrarenal aortic aneurysm.  OSSEOUS: No acute abnormalities.  IMPRESSION: 1. Near the patient's pyloric Cheree Ditto patch is a transmural defect in the stomach wall, contiguous with the surgical drain. If repeat surgery is considered, oral contrast could confirm an open ulcer. 2. Decreased rectovaginal recess fluid collection, now 4 x 1.5 cm (previously 5 x 3 cm).   Electronically Signed   By: Marnee Spring M.D.   On: 08/22/2014 01:49   Ct Abdomen Pelvis Wo Contrast  08/15/2014   CLINICAL DATA:  Intra abdominal abscess.  EXAM: CT ABDOMEN AND PELVIS WITHOUT CONTRAST  TECHNIQUE: Multidetector CT imaging of the abdomen and pelvis was performed following the standard protocol without IV contrast.  COMPARISON:  CT scan of Aug 03, 2014.  FINDINGS: Severe degenerative disc disease is noted at L5-S1. Minimal subsegmental atelectasis is noted posteriorly in the left lung base.  No gallstones are noted. No focal abnormality is noted in the liver, spleen or pancreas on these unenhanced images. Adrenal glands appear normal. Stable exophytic cyst is seen arising from midpole of left kidney. No hydronephrosis or renal obstruction is noted. No renal or ureteral calculi are noted. 3 cm infrarenal abdominal aortic aneurysm is noted. Gastrojejunostomy tube is seen entering stomach percutaneously and with distal tip in the jejunum. JP surgical  drain is seen entering right upper quadrant of abdomen underneath the liver with distal tip in the left upper quadrant. No significant fluid collection is noted around the drain. Fluid collection is again identified and posterior cul-de-sac of pelvis measuring 5.1 x 3.2 cm. Calcified degenerating fibroid is noted in the uterus. Urinary bladder is unremarkable. There is no evidence of bowel obstruction. The appendix appears normal. No significant adenopathy is noted. Large midline surgical anterior abdominal wall incision is noted.  IMPRESSION: Stable 3 cm infrarenal abdominal aortic aneurysm.  No change in position of JP surgical drain with distal tip in the left upper quadrant of the abdomen.  Interval placement of percutaneous gastrojejunostomy tube with distal tip in the jejunum.  Grossly stable size and appearance of 5 cm fluid collection seen in posterior cul-de-sac of the pelvis.  Continued presence uterine fibroids.   Electronically Signed   By: Lupita Raider, M.D.   On: 08/15/2014 15:15   Dg Chest 1 View  08/13/2014   CLINICAL DATA:  Shortness of breath  EXAM: CHEST  1 VIEW  COMPARISON:  08/03/2014  FINDINGS: Moderately severe cardiac enlargement. Central line in unchanged position on the right and left side. Mild vascular congestion with no evidence of edema effusion or consolidation.  IMPRESSION: No acute findings   Electronically Signed   By: Esperanza Heir M.D.   On: 08/13/2014 11:04   Dg Abd 1 View  08/03/2014  CLINICAL DATA:  NG tube placement  EXAM: ABDOMEN - 1 VIEW  COMPARISON:  07/13/2014  FINDINGS: NG tube tip is in the proximal stomach. The side port is near the GE junction.  IMPRESSION: NG tube tip in the proximal stomach.   Electronically Signed   By: Charlett Nose M.D.   On: 08/03/2014 17:18   Ct Angio Chest Pe W/cm &/or Wo Cm  07/29/2014   CLINICAL DATA:  Shortness of breath. Patients brief history is as follows: "smoker with hx HTN, ESRD on HD (just began this admit), COPD, failed  L AV fistula, initially admitted 3/16 with flu and acute hypoxic resp failure. Ultimately tx to [facility] due to need for HD. 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 Patient develops septic shock on 4-1, she was started on pressors and wean off pressors on 4-3. Subsequently she was diagnosed with pelvic abscess by CT scan perform on 4-7. She underwent pelvic drain placement by IR. Patient also develops C. diff diagnosed 4-12. C diff was not responding flagyl, and she was started on vancomycin 4-16. She also develops run of SVT. Cardiology has been helping with management. Course complicated with high RV pressure and concerns for PE; also with fistula formation between anastomosis and blake drain, making her back to NPO state and in need of TPN.  EXAM: CT ANGIOGRAPHY CHEST WITH CONTRAST  TECHNIQUE: Multidetector CT imaging of the chest was performed using the standard protocol during bolus administration of intravenous contrast. Multiplanar CT image reconstructions and MIPs were obtained to evaluate the vascular anatomy.  CONTRAST:  80mL OMNIPAQUE IOHEXOL 350 MG/ML SOLN  COMPARISON:  Chest radiograph, 07/14/2014  FINDINGS: Angiographic study: No evidence of a pulmonary embolus. Aorta is normal in caliber. No dissection.  Thoracic inlet:  No masses or adenopathy.  Mediastinum and hila: Heart is mildly enlarged due to right heart enlargement. No mediastinal or hilar masses or pathologically enlarged lymph nodes.  Lungs and pleura: No lung consolidation or edema. Mild emphysema. There are minor areas of peripheral reticular scarring and minor dependent subsegmental atelectasis. No pleural effusion or pneumothorax.  Limited upper abdomen: Partly image catheter lies above the proximal stomach. No acute findings in the upper abdomen.  Musculoskeletal:  No osteoblastic or osteolytic lesions.  Review of the MIP  images confirms the above findings.  IMPRESSION: 1. No evidence of a pulmonary embolus. 2. No acute findings. 3. Cardiomegaly. 4. Mild emphysema.  No evidence of pneumonia or pulmonary edema.   Electronically Signed   By: Amie Portland M.D.   On: 07/29/2014 15:53   Ct Abdomen Pelvis W Contrast  08/04/2014   CLINICAL DATA:  New epigastric abdominal pain.  EXAM: CT ABDOMEN AND PELVIS WITH CONTRAST  TECHNIQUE: Multidetector CT imaging of the abdomen and pelvis was performed using the standard protocol following bolus administration of intravenous contrast.  CONTRAST:  OMNIPAQUE IOHEXOL 300 MG/ML  SOLN  COMPARISON:  07/16/2014  FINDINGS: There is a small volume extraluminal air alongside the JP drain in the anterior abdominal midline. Source of the air is not clear. No other extraluminal air is evident in the abdomen or pelvis. No undrained fluid collection is evident alongside the JP drain. There is a 3.5 x 5.7 x 3.0 cm contained collection in the pelvic cul-de-sac which could represent a small abscess.  There is an open midline  abdominal wound without associated drainable collection. There is herniation of abdominal fat through the abdominal wall musculature at the location of the wound.  There are normal appearances of the liver, spleen, pancreas, adrenals and kidneys with the exception of a 1.8 cm simple left renal cyst. There is an unchanged 3 cm infrarenal abdominal aortic aneurysm. The duodenum is draped over the aneurysm but appears intact.  There are multiple uterine fibroids.  There is no significant abnormality in the lower chest.  IMPRESSION: *Extraluminal air alongside the JP drain in the anterior abdominal midline, etiology not apparent on this scan *3.0 x 3.5 x 5.7 cm collection in the pelvic cul-de-sac *Unchanged 3 cm infrarenal abdominal aortic aneurysm directly adjacent to the duodenum *Open wound in the abdominal midline. No fluid collection near the wound.   Electronically Signed   By: Ellery Plunkaniel  R Mitchell M.D.   On: 08/04/2014 02:24   Ir Gastrostomy Tube Mod Sed  08/12/2014   CLINICAL DATA:  Perforated duodenal ulcer, post g patch with persistent leak. Percutaneous gastrojejunostomy as requested for gastric decompression and small bowel enteral feeding support.  EXAM: PERC PLACEMENT GASTROSTOMY;  CONVERT G-TUBE TO G-JTUBE  FLUOROSCOPY TIME:  16 minutes 24 seconds, 1016.6 mGy  TECHNIQUE: The procedure, risks, benefits, and alternatives were explained to the patient. Questions regarding the procedure were encouraged and answered. The patient understands and consents to the procedure. As antibiotic prophylaxis, cefazolin 2 g was ordered pre-procedure and administered intravenously within one hour of incision. . A 5 French angiographic catheter was placed as orogastric tube. The upper abdomen was prepped with Betadine, draped in usual sterile fashion, and infiltrated locally with 1% lidocaine. 1 mg glucagon was administered intravenously to facilitate gastric distention and slow peristalsis. Stomach was insufflated using air through the orogastric tube. An 7418 French sheath needle was advanced percutaneously into the gastric lumen under fluoroscopy. Gas could be aspirated and a small contrast injection confirmed intraluminal spread. The sheath was exchanged over a guidewire for a 9 [place] vascular sheath, through which the snare device was advanced and used to snare a guidewire passed through the orogastric tube. This was withdrawn, and the snare attached to the 20 French pull-through gastrostomy tube, which was advanced antegrade, positioned with the internal bumper securing the anterior gastric wall to the anterior abdominal wall. Small contrast injection confirms appropriate positioning. The external bumper was applied and the catheter was flushed.  A 5 French angiographic catheter was placed coaxially through the gastrostomy tube and used to direct an angled stiff glidewire across the pylorus, beyond the  proximal duodenal perforation, and into the proximal jejunum just beyond the ligament of Treitz. Over this, a coaxial jejunostomy feeding adapter was advanced. Contrast injection confirmed patency and appropriate position of the gastric and jejunal lumens. No extravasation. The lumens were flushed with saline and capped. The patient tolerated the procedure well.  COMPLICATIONS: COMPLICATIONS none  IMPRESSION: 1. Technically successful 20 French pull-through gastrostomy placement under fluoroscopy. 2. Technically successful coaxial jejunostomy feeding adapter placement to the ligament of Treitz.   Electronically Signed   By: Corlis Leak  Hassell M.D.   On: 08/12/2014 16:12   Ir Fluoro Guide Cv Line Left  07/29/2014   CLINICAL DATA:  End-stage renal disease, access for TPN  EXAM: LEFT IJ TUNNELED DOUBLE-LUMEN POWER PICC LINE PLACEMENT WITH ULTRASOUND AND FLUOROSCOPIC GUIDANCE  FLUOROSCOPY TIME:  3 minutes 24 seconds  PROCEDURE: The patient was advised of the possible risks andcomplications and agreed to undergo the procedure. The patient  was then brought to the angiographic suite for the procedure.  The LEFT NECKwas prepped with chlorhexidine, drapedin the usual sterile fashion using maximum barrier technique (cap and mask, sterile gown, sterile gloves, large sterile sheet, hand hygiene and cutaneous antisepsis) and infiltrated locally with 1% Lidocaine.  Ultrasound demonstrated patency of the left internal jugular vein, and this was documented with an image. Under real-time ultrasound guidance, this vein was accessed with a 21 gauge micropuncture needle and image documentation was performed. A 0.018 wire was introduced in to the vein. Under sterile conditions and local anesthesia, a subcutaneous tunnel was created in the left infraclavicular chest. PICC line was tunneled subcutaneously to the venotomy site. Through a peel-away sheath, a cuffed 5 [place] double lumen power PICC was advanced to the lower SVC/right atrial  junction. Fluoroscopy during the procedure and fluoro spot radiograph confirms appropriate catheter position. The catheter was flushed and covered with asterile dressing.  Catheter length: 26  Complications: None immediate  IMPRESSION: Successful left internal jugulartunneled double-lumen Power PICC line placement with ultrasound and fluoroscopic guidance. The catheter is ready for use.   Electronically Signed   By: Judie Petit.  Shick M.D.   On: 07/29/2014 13:03   Ir Adele Schilder Tamsen Snider Convert Gastr-jej Per W/fl Mod Sed  08/12/2014   CLINICAL DATA:  Perforated duodenal ulcer, post g patch with persistent leak. Percutaneous gastrojejunostomy as requested for gastric decompression and small bowel enteral feeding support.  EXAM: PERC PLACEMENT GASTROSTOMY;  CONVERT G-TUBE TO G-JTUBE  FLUOROSCOPY TIME:  16 minutes 24 seconds, 1016.6 mGy  TECHNIQUE: The procedure, risks, benefits, and alternatives were explained to the patient. Questions regarding the procedure were encouraged and answered. The patient understands and consents to the procedure. As antibiotic prophylaxis, cefazolin 2 g was ordered pre-procedure and administered intravenously within one hour of incision. . A 5 French angiographic catheter was placed as orogastric tube. The upper abdomen was prepped with Betadine, draped in usual sterile fashion, and infiltrated locally with 1% lidocaine. 1 mg glucagon was administered intravenously to facilitate gastric distention and slow peristalsis. Stomach was insufflated using air through the orogastric tube. An 55 French sheath needle was advanced percutaneously into the gastric lumen under fluoroscopy. Gas could be aspirated and a small contrast injection confirmed intraluminal spread. The sheath was exchanged over a guidewire for a 9 [place] vascular sheath, through which the snare device was advanced and used to snare a guidewire passed through the orogastric tube. This was withdrawn, and the snare attached to the 20 French  pull-through gastrostomy tube, which was advanced antegrade, positioned with the internal bumper securing the anterior gastric wall to the anterior abdominal wall. Small contrast injection confirms appropriate positioning. The external bumper was applied and the catheter was flushed.  A 5 French angiographic catheter was placed coaxially through the gastrostomy tube and used to direct an angled stiff glidewire across the pylorus, beyond the proximal duodenal perforation, and into the proximal jejunum just beyond the ligament of Treitz. Over this, a coaxial jejunostomy feeding adapter was advanced. Contrast injection confirmed patency and appropriate position of the gastric and jejunal lumens. No extravasation. The lumens were flushed with saline and capped. The patient tolerated the procedure well.  COMPLICATIONS: COMPLICATIONS none  IMPRESSION: 1. Technically successful 20 French pull-through gastrostomy placement under fluoroscopy. 2. Technically successful coaxial jejunostomy feeding adapter placement to the ligament of Treitz.   Electronically Signed   By: Corlis Leak M.D.   On: 08/12/2014 16:12   Ir US Guide Vasc Access Left  07/29/2014   CLINICAL DATA:  End-stage renal disease, access for TPN  EXAM: LEFT IJ TUNNELED DOUBLE-LUMEN POWER PICC LINE PLACEMENT WITH ULTRASOUND AND FLUOROSCOPIC GUIDANCE  FLUOROSCOPY TIME:  3 minutes 24 seconds  PROCEDURE: The patient was advised of the possible risks andcomplications and agreed to undergo the procedure. The patient was then brought to the angiographic suite for the procedure.  The LEFT NECKwas prepped with chlorhexidine, drapedin the usual sterile fashion using maximum barrier technique (cap and mask, sterile gown, sterile gloves, large sterile sheet, hand hygiene and cutaneous antisepsis) and infiltrated locally with 1% Lidocaine.  Ultrasound demonstrated patency of the left internal jugular vein, and this was documented with an image. Under real-time ultrasound  guidance, this vein was accessed with a 21 gauge micropuncture needle and image documentation was performed. A 0.018 wire was introduced in to the vein. Under sterile conditions and local anesthesia, a subcutaneous tunnel was created in the left infraclavicular chest. PICC line was tunneled subcutaneously to the venotomy site. Through a peel-away sheath, a cuffed 5 [place] double lumen power PICC was advanced to the lower SVC/right atrial junction. Fluoroscopy during the procedure and fluoro spot radiograph confirms appropriate catheter position. The catheter was flushed and covered with asterile dressing.  Catheter length: 26  Complications: None immediate  IMPRESSION: Successful left internal jugulartunneled double-lumen Power PICC line placement with ultrasound and fluoroscopic guidance. The catheter is ready for use.   Electronically Signed   By: Judie Petit.  Shick M.D.   On: 07/29/2014 13:03   Dg Chest Port 1 View  08/23/2014   CLINICAL DATA:  Left chest pain  EXAM: PORTABLE CHEST - 1 VIEW  COMPARISON:  08/18/2014  FINDINGS: Patient is rotated.  Lungs are essentially clear. No focal consolidation. No pleural effusion or pneumothorax.  Cardiomegaly.  Left IJ venous catheter terminates at the cavoatrial junction. Right IJ dual lumen dialysis catheter terminates in the lower SVC.  IMPRESSION: No evidence of acute cardiopulmonary disease.   Electronically Signed   By: Charline Bills M.D.   On: 08/23/2014 19:17   Dg Chest Port 1 View  08/18/2014   CLINICAL DATA:  Shortness of breath at rest.  Having hemodialysis.  EXAM: PORTABLE CHEST - 1 VIEW  COMPARISON:  08/15/2014.  FINDINGS: The cardiac silhouette remains mildly enlarged. Stable right jugular double-lumen catheter. Clear lungs. The pulmonary vasculature remains prominent. No pleural fluid. Minimal right shoulder degenerative changes.  IMPRESSION: Stable cardiomegaly and pulmonary vascular congestion.   Electronically Signed   By: Beckie Salts M.D.   On:  08/18/2014 09:47   Dg Chest Port 1 View  08/15/2014   CLINICAL DATA:  Shortness of breath.  EXAM: PORTABLE CHEST - 1 VIEW  COMPARISON:  08/13/2014 and multiple prior chest radiographs  FINDINGS: Cardiomegaly and pulmonary vascular congestion again noted.  A right IJ central venous catheter is noted with tips overlying the upper and mid SVC.  A left central venous catheter is present with tip overlying the lower SVC.  There is no evidence of focal airspace disease, pulmonary edema, suspicious pulmonary nodule/mass, pleural effusion, or pneumothorax. No acute bony abnormalities are identified.  IMPRESSION: Cardiomegaly with pulmonary vascular congestion.   Electronically Signed   By: Harmon Pier M.D.   On: 08/15/2014 17:28   Dg Chest Port 1v Same Day  08/03/2014   CLINICAL DATA:  59 year old female dialysis patient with severe shortness of breath. Respiratory distress. Initial encounter.  EXAM: PORTABLE CHEST - 1 VIEW SAME DAY  COMPARISON:  Chest CTA  07/29/2014 and earlier.  FINDINGS: Portable AP semi upright view at 1400 hrs. Bilateral IJ approach chest catheters appears stable. Stable lung volumes. Stable cardiomegaly and mediastinal contours. No pneumothorax, pulmonary edema, pleural effusion or consolidation. No acute pulmonary opacity identified.  IMPRESSION: No acute cardiopulmonary abnormality.   Electronically Signed   By: Odessa Fleming M.D.   On: 08/03/2014 14:18   Dg Abd Portable 1v  08/23/2014   CLINICAL DATA:  JP drain, broken, initial encounter  EXAM: PORTABLE ABDOMEN - 1 VIEW  COMPARISON:  CT abdomen pelvis dated 08/21/2014  FINDINGS: A portion of the right flank is excluded from this single image.  The patient's right abdominal surgical drain is not visualized.  Gastrojejunostomy in satisfactory position.  IMPRESSION: The patient's right abdominal surgical drain is not visualized.  Gastrojejunostomy in satisfactory position.   Electronically Signed   By: Charline Bills M.D.   On: 08/23/2014 19:11     Ct Image Guided Fluid Drain By Catheter  08/17/2014   CLINICAL DATA:  Postoperative intra-abdominal abscess, subsequent encounter. Small pelvic fluid collection.  EXAM: CT-GUIDED DRAIN PLACEMENT IN PELVIC FLUID COLLECTION  Physician: Rachelle Hora. Lowella Dandy, MD  FLUOROSCOPY TIME:  None  MEDICATIONS: 50 mcg fentanyl  ANESTHESIA/SEDATION: Patient was monitored by a radiology nurse during the procedure.  PROCEDURE: Informed consent was obtained for drain placement. Patient was placed on her left side. Images through the pelvis were obtained. The right buttock was prepped and draped in sterile fashion. 1% lidocaine was used for a local anesthetic. An 18 gauge needle was directed into the pelvic fluid collection with CT guidance from a transgluteal approach. Thick yellow purulent fluid was aspirated. A stiff Amplatz wire was placed. The tract was dilated to accommodate a 10.2 [place] multipurpose drain. It was technically difficult to advance the drain into the collection due to the small size of the collection and the patient's body habitus. Eventually, the drain was successfully placed within the collection. 5 mL of yellow purulent fluid was obtained. Catheter was sutured to the skin and attached to a suction bulb. Fluid was sent for culture.  FINDINGS: Small fluid collection just posterior to the uterus. A total of 5 mL of yellow purulent fluid was removed.  Estimated blood loss: Minimal  COMPLICATIONS: None  IMPRESSION: CT-guided placement of a drainage catheter in the small pelvic fluid collection.   Electronically Signed   By: Richarda Overlie M.D.   On: 08/17/2014 17:21         Subjective: Patient complains of abdominal pain. Denies any fevers, chills, chest discomfort, vomiting, diarrhea. She has some intermittent nausea. She has some dyspnea on exertion.  Objective: Filed Vitals:   08/26/14 0500 08/26/14 0748 08/26/14 1000 08/26/14 1005  BP:   96/59   Pulse:   57   Temp:      TempSrc:      Resp:   23 16   Height:      Weight: 98 kg (216 lb 0.8 oz)     SpO2:  96% 82%     Intake/Output Summary (Last 24 hours) at 08/26/14 1130 Last data filed at 08/26/14 5409  Gross per 24 hour  Intake 610.08 ml  Output   1550 ml  Net -939.92 ml   Weight change: -0.802 kg (-1 lb 12.3 oz) Exam:   General:  Pt is alert, follows commands appropriately, not in acute distress  HEENT: No icterus, No thrush,  [place]/AT  Cardiovascular:  Irregular, S1/S2, no rubs, no gallops  Respiratory:  Bibasilar crackles. No wheeze.  Abdomen: Soft/+BS, non tender, non distended, no guarding  Extremities: No edema, No lymphangitis, No petechiae, No rashes, no synovitis  Data Reviewed: Basic Metabolic Panel:  Recent Labs Lab 08/20/14 0500 08/21/14 0253 08/21/14 0825 08/21/14 1526 08/22/14 0530 08/23/14 0415 08/24/14 0520 08/25/14 0525 08/25/14 0747  NA 130* 132* 132* 136  --  137  --  138 139  K 3.7 4.2 4.1 4.1  --  4.3  --  4.5 4.5  CL 96* 98* 96* 101  --  99*  --  101 100*  CO2 26 25 27 29   --  30  --  28 27  GLUCOSE 125* 123* 138* 107*  --  120*  --  83 90  BUN 67* 34* 39* 11  --  14  --  34* 34*  CREATININE 4.66* 2.98* 3.20* 1.71*  --  2.43*  --  5.10* 5.05*  CALCIUM 7.6* 7.8* 7.8* 7.7*  --  8.3*  --  8.3* 8.1*  MG 2.0 1.9  --   --  2.0 2.0 2.0  --   --   PHOS 4.4 3.6 3.9 2.1* 3.3 3.3 5.0*  --  6.1*   Liver Function Tests:  Recent Labs Lab 08/20/14 0500 08/21/14 0253 08/21/14 0825 08/21/14 1526 08/25/14 0525 08/25/14 0747  AST 20 25  --   --  17  --   ALT 8* 9*  --   --  9*  --   ALKPHOS 139* 151*  --   --  127*  --   BILITOT 0.9 0.8  --   --  1.3*  --   PROT 6.2* 6.4*  --   --  6.7  --   ALBUMIN 1.5* 1.6* 1.6* 2.0* 2.0* 2.1*   No results for input(s): LIPASE, AMYLASE in the last 168 hours. No results for input(s): AMMONIA in the last 168 hours. CBC:  Recent Labs Lab 08/22/14 0530 08/23/14 0415 08/24/14 0520 08/25/14 0525 08/26/14 0648  WBC 14.4* 15.3* 15.7* 13.6* 13.2*   NEUTROABS  --   --  12.5*  --   --   HGB 7.9* 7.8* 7.6* 7.9* 7.1*  HCT 25.5* 26.2* 25.0* 25.6* 24.1*  MCV 93.4 94.6 96.2 95.9 97.2  PLT 281 307 309 357 327   Cardiac Enzymes:  Recent Labs Lab 08/23/14 1944 08/24/14 0221 08/24/14 0630  TROPONINI 0.04* 0.03 0.03   BNP: Invalid input(s): POCBNP CBG:  Recent Labs Lab 08/24/14 0012 08/24/14 0605 08/24/14 1631 08/25/14 0110 08/25/14 1741  GLUCAP 115* 136* 139* 122* 124*    Recent Results (from the past 240 hour(s))  Culture, routine-abscess     Status: None   Collection Time: 08/17/14  2:51 PM  Result Value Ref Range Status   Specimen Description ABSCESS PELVIS  Final   Special Requests NONE  Final   Gram Stain   Final    FEW WBC PRESENT,BOTH PMN AND MONONUCLEAR NO SQUAMOUS EPITHELIAL CELLS SEEN NO ORGANISMS SEEN Performed at [facility]    Culture   Final    NO GROWTH 3 DAYS Performed at [facility]    Report Status 08/21/2014 FINAL  Final     Scheduled Meds: . antiseptic oral rinse  7 mL Mouth Rinse q12n4p  . budesonide (PULMICORT) nebulizer solution  0.25 mg Nebulization BID  . cefTAZidime (FORTAZ)  IV  2 g Intravenous Q T,Th,Sa-HD  . chlorhexidine  15 mL Mouth Rinse BID  . [START ON 08/27/2014] darbepoetin (ARANESP)  injection - DIALYSIS  200 mcg Intravenous Q Thu-HD  . feeding supplement (VITAL AF 1.2 CAL)  1,000 mL Per Tube Q24H  . levothyroxine  12.5 mcg Intravenous Daily  . metoprolol  2.5 mg Intravenous 4 times per day  . metronidazole  500 mg Intravenous 3 times per day  . sodium chloride  10-40 mL Intracatheter Q12H   Continuous Infusions: . sodium chloride 10 mL/hr at 08/19/14 0950  . TPN (CLINIMIX) Adult without lytes 40 mL/hr at 08/25/14 1800  . TPN [facility]) Adult without lytes       Aprel Egelhoff, DO  [facility] Pager [phone]  If 7PM-7AM, please contact night-coverage [email or web address] Password [email or web address] 08/26/2014, 11:30 AM   LOS: 70 days

## 2014-08-26 NOTE — Progress Notes (Signed)
INFECTIOUS DISEASE PROGRESS NOTE  ID: [name] is a 59 y.o. female with  Principal Problem:   Acute respiratory failure with hypoxia Active Problems:   Cerebral thrombosis with cerebral infarction   Hypertension   ESRD needing dialysis   Obesity (BMI 30-39.9)   Depression   Left renal mass   Chronic diastolic heart failure   H1N1 influenza   Tobacco use disorder   Renal failure (ARF), acute on chronic   Hyperlipidemia   PSVT (paroxysmal supraventricular tachycardia)   SOB (shortness of breath)   Perforated gastric ulcer   Abnormal TSH   History of intermediate V/Q scan   HIT (heparin-induced thrombocytopenia)   Fistula   Abdominal pain   Palliative care encounter   ESRD (end stage renal disease)   SVT (supraventricular tachycardia)   Right heart failure   Duodenal anastomotic leak   Protein calorie malnutrition   Itching   Aortic aneurysm   Protein-calorie malnutrition  Subjective: Without complaints  Abtx:  Anti-infectives    Start     Dose/Rate Route Frequency Ordered Stop   08/21/14 1430  cefTAZidime (FORTAZ) 1 g in dextrose 5 % 50 mL IVPB     1 g 100 mL/hr over 30 Minutes Intravenous  Once 08/21/14 1426 08/21/14 1547   08/18/14 1300  metroNIDAZOLE (FLAGYL) IVPB 500 mg     500 mg 100 mL/hr over 60 Minutes Intravenous 3 times per day 08/18/14 1045     08/18/14 1200  cefTAZidime (FORTAZ) 2 g in dextrose 5 % 50 mL IVPB     2 g 100 mL/hr over 30 Minutes Intravenous Every T-Th-Sa (Hemodialysis) 08/18/14 1054     08/12/14 1449  ceFAZolin (ANCEF) 2-3 GM-% IVPB SOLR    Comments:  Troy Sine   : cabinet override      08/12/14 1449 08/12/14 1629   08/12/14 1130  ceFAZolin (ANCEF) IVPB 2 g/50 mL premix    Comments:  Hang ON CALL to xray 5/11   2 g 100 mL/hr over 30 Minutes Intravenous To Radiology 08/12/14 1035 08/12/14 1520   08/04/14 1200  vancomycin (VANCOCIN) 50 mg/mL oral solution 125 mg  Status:  Discontinued     125 mg Oral 4 times per day  08/04/14 0851 08/08/14 1229   07/18/14 1800  vancomycin (VANCOCIN) 50 mg/mL oral solution 125 mg  Status:  Discontinued     125 mg Oral 4 times per day 07/18/14 1455 08/04/14 0845   07/17/14 1830  vancomycin (VANCOCIN) IVPB 750 mg/150 ml premix     750 mg 150 mL/hr over 60 Minutes Intravenous  Once 07/17/14 1818 07/18/14 0024   07/15/14 1400  metroNIDAZOLE (FLAGYL) tablet 500 mg  Status:  Discontinued     500 mg Oral 3 times per day 07/15/14 1202 07/18/14 1533   07/13/14 2200  piperacillin-tazobactam (ZOSYN) IVPB 2.25 g  Status:  Discontinued     2.25 g 100 mL/hr over 30 Minutes Intravenous 3 times per day 07/13/14 1318 07/24/14 1138   07/13/14 2000  fluconazole (DIFLUCAN) IVPB 200 mg  Status:  Discontinued     200 mg 100 mL/hr over 60 Minutes Intravenous Every 24 hours 07/13/14 1318 07/19/14 1027   07/07/14 1200  vancomycin (VANCOCIN) IVPB 1000 mg/200 mL premix  Status:  Discontinued     1,000 mg 200 mL/hr over 60 Minutes Intravenous Every 24 hours 07/07/14 0937 07/14/14 1712   07/06/14 2000  fluconazole (DIFLUCAN) IVPB 400 mg  Status:  Discontinued  400 mg 100 mL/hr over 120 Minutes Intravenous Every 24 hours 07/06/14 1507 07/13/14 1318   07/06/14 1800  piperacillin-tazobactam (ZOSYN) IVPB 2.25 g  Status:  Discontinued     2.25 g 100 mL/hr over 30 Minutes Intravenous 4 times per day 07/06/14 1505 07/13/14 1318   07/05/14 2000  fluconazole (DIFLUCAN) IVPB 200 mg  Status:  Discontinued     200 mg 100 mL/hr over 60 Minutes Intravenous Every 24 hours 07/05/14 0757 07/06/14 1507   07/05/14 1400  piperacillin-tazobactam (ZOSYN) IVPB 2.25 g  Status:  Discontinued     2.25 g 100 mL/hr over 30 Minutes Intravenous 3 times per day 07/05/14 0749 07/06/14 1505   07/02/14 1800  vancomycin (VANCOCIN) IVPB 1000 mg/200 mL premix  Status:  Discontinued     1,000 mg 200 mL/hr over 60 Minutes Intravenous Every 24 hours 07/01/14 1858 07/05/14 0801   07/01/14 2200  piperacillin-tazobactam (ZOSYN) IVPB  3.375 g  Status:  Discontinued     3.375 g 100 mL/hr over 30 Minutes Intravenous 4 times per day 07/01/14 1858 07/05/14 0749   07/01/14 2000  fluconazole (DIFLUCAN) IVPB 400 mg  Status:  Discontinued     400 mg 100 mL/hr over 120 Minutes Intravenous Every 24 hours 07/01/14 1858 07/05/14 0757   07/01/14 1400  fluconazole (DIFLUCAN) IVPB 200 mg  Status:  Discontinued     200 mg 100 mL/hr over 60 Minutes Intravenous Every 24 hours 07/01/14 1330 07/01/14 1853   07/01/14 1000  piperacillin-tazobactam (ZOSYN) IVPB 2.25 g  Status:  Discontinued     2.25 g 100 mL/hr over 30 Minutes Intravenous Every 8 hours 07/01/14 0952 07/01/14 1853   07/01/14 1000  vancomycin (VANCOCIN) 500 mg in sodium chloride 0.9 % 100 mL IVPB     500 mg 100 mL/hr over 60 Minutes Intravenous  Once 07/01/14 0952 07/01/14 1126   06/30/14 1200  vancomycin (VANCOCIN) IVPB 1000 mg/200 mL premix     1,000 mg 200 mL/hr over 60 Minutes Intravenous  Once 06/30/14 0944 06/30/14 1403   06/30/14 1200  ceFEPIme (MAXIPIME) 2 g in dextrose 5 % 50 mL IVPB     2 g 100 mL/hr over 30 Minutes Intravenous  Once 06/30/14 0944 06/30/14 1425   06/30/14 0100  vancomycin (VANCOCIN) 2,000 mg in sodium chloride 0.9 % 500 mL IVPB     2,000 mg 250 mL/hr over 120 Minutes Intravenous  Once 06/30/14 0048 06/30/14 0525   06/30/14 0100  ceFEPIme (MAXIPIME) 2 g in dextrose 5 % 50 mL IVPB     2 g 100 mL/hr over 30 Minutes Intravenous  Once 06/30/14 0048 06/30/14 0322   06/27/14 0600  cefUROXime (ZINACEF) 1.5 g in dextrose 5 % 50 mL IVPB     1.5 g 100 mL/hr over 30 Minutes Intravenous On call to O.R. 06/26/14 1019 06/27/14 0630   06/19/14 1800  oseltamivir (TAMIFLU) capsule 30 mg     30 mg Oral Every M-W-F (1800) 06/19/14 1003 06/22/14 1841   06/18/14 1600  oseltamivir (TAMIFLU) capsule 30 mg  Status:  Discontinued     30 mg Oral Daily 06/18/14 1538 06/19/14 1003      Medications:  Scheduled: . antiseptic oral rinse  7 mL Mouth Rinse q12n4p  .  budesonide (PULMICORT) nebulizer solution  0.25 mg Nebulization BID  . cefTAZidime (FORTAZ)  IV  2 g Intravenous Q T,Th,Sa-HD  . chlorhexidine  15 mL Mouth Rinse BID  . darbepoetin (ARANESP) injection - DIALYSIS  100 mcg  Intravenous Q Thu-HD  . feeding supplement (VITAL AF 1.2 CAL)  1,000 mL Per Tube Q24H  . levothyroxine  12.5 mcg Intravenous Daily  . metoprolol  2.5 mg Intravenous 4 times per day  . metronidazole  500 mg Intravenous 3 times per day  . sodium chloride  10-40 mL Intracatheter Q12H    Objective: Vital signs in last 24 hours: Temp:  [98 F (36.7 C)-98.7 F (37.1 C)] 98 F (36.7 C) (05/25 0438) Pulse Rate:  [61-154] 113 (05/25 0438) Resp:  [17-28] 27 (05/25 0438) BP: (76-109)/(42-86) 90/53 mmHg (05/25 0438) SpO2:  [94 %-100 %] 96 % (05/25 0748) Weight:  [98 kg (216 lb 0.8 oz)] 98 kg (216 lb 0.8 oz) (05/25 0500)   General appearance: alert, cooperative and no distress Resp: rhonchi L > R Cardio: tachycaardia GI: normal findings: bowel sounds normal and soft, non-tender  Lab Results  Recent Labs  08/25/14 0525 08/25/14 0747 08/26/14 0648  WBC 13.6*  --  13.2*  HGB 7.9*  --  7.1*  HCT 25.6*  --  24.1*  NA 138 139  --   K 4.5 4.5  --   CL 101 100*  --   CO2 28 27  --   BUN 34* 34*  --   CREATININE 5.10* 5.05*  --    Liver Panel  Recent Labs  08/25/14 0525 08/25/14 0747  PROT 6.7  --   ALBUMIN 2.0* 2.1*  AST 17  --   ALT 9*  --   ALKPHOS 127*  --   BILITOT 1.3*  --    Sedimentation Rate No results for input(s): ESRSEDRATE in the last 72 hours. C-Reactive Protein No results for input(s): CRP in the last 72 hours.  Microbiology: Recent Results (from the past 240 hour(s))  Culture, routine-abscess     Status: None   Collection Time: 08/17/14  2:51 PM  Result Value Ref Range Status   Specimen Description ABSCESS PELVIS  Final   Special Requests NONE  Final   Gram Stain   Final    FEW WBC PRESENT,BOTH PMN AND MONONUCLEAR NO SQUAMOUS  EPITHELIAL CELLS SEEN NO ORGANISMS SEEN Performed at [facility]    Culture   Final    NO GROWTH 3 DAYS Performed at [facility]    Report Status 08/21/2014 FINAL  Final    Studies/Results: No results found.   Assessment/Plan: Wound infection Abscess 5-14 pseudomonas Perforated pyloric ulcer, ? Leak on CT Ischemic necrosis ileum COPD CKD, ESRD on HD HTN Protein calorie malnutrition, severe C diff (4-12)  +HIT  Total days of antibiotics: 9 ceftaz/flagyl  Appears about the same.  Continue to watch Aim for 21 days total of ceftaz. Continue flagyl 1 week post.  Available if questions         Johny Sax Infectious Diseases (pager) [phone] [email or web address] 08/26/2014, 9:19 AM  LOS: 70 days

## 2014-08-26 NOTE — Evaluation (Signed)
Occupational Therapy Evaluation Patient Details Name: [name] MRN: [id number] DOB: Jun 25, 1955 Today's Date: 08/26/2014    History of Present Illness [name] is a 59 y.o. female with a history of CKD and hypertension came to the [facility] ED on 3/16 complaining of shortness of breath and generalized weakness x 2 weeks. Admitted with acute hypoxemic respiratory failure. During hospital stay noted to have weakness of her left side. 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  She developed septic shock 4/1 with pressors weaned off 4/3.  Began CRRT and pressors restarted 4/4; Developed pelvic abcess 4/7 with pelvic drain placed 4/8.  weaned off pressors 4/9.  Runs of SVT 4/5 and 4/11. Prolonged hospital course with hypoxemia and hypotension   Clinical Impression   Pt smiling and readily willing to work on self care and get up to chair.  Making excellent gains in activity tolerance and independence today.  HR to 155 with activity with return to 109-111 with rest.       Follow Up Recommendations  SNF;Supervision/Assistance - 24 hour    Equipment Recommendations       Recommendations for Other Services       Precautions / Restrictions Precautions Precautions: Fall Precaution Comments: 2 abdominal drains to foley bags, G tube      Mobility Bed Mobility Overal bed mobility: Needs Assistance Bed Mobility: Supine to Sit     Supine to sit: Min assist;HOB elevated     General bed mobility comments: pulled up with therapist's hand  Transfers Overall transfer level: Needs assistance Equipment used: Rolling walker (2 wheeled) Transfers: Sit to/from [employer] Sit to Stand: +2 physical assistance;Min assist Stand pivot transfers: +2 physical assistance;Min assist       General transfer comment: cues for hand placement and to position L  LE, used momentum, elevated bed    Balance                                            ADL Overall ADL's : Needs assistance/impaired     Grooming: Wash/dry hands;Wash/dry face;Oral care;Sitting;Supervision/safety Grooming Details (indicate cue type and reason): used mouthwash, washed face including wringing out washcloth Upper Body Bathing: Minimal assitance;Sitting Upper Body Bathing Details (indicate cue type and reason): assist only for back Lower Body Bathing: Moderate assistance;Sitting/lateral leans;Sit to/from stand Lower Body Bathing Details (indicate cue type and reason): pt washed tops of legs and front periarea, assist for back side and feet Upper Body Dressing : Minimal assistance;Sitting   Lower Body Dressing: Minimal assistance;Sitting/lateral leans Lower Body Dressing Details (indicate cue type and reason): in long sitting, pt able to donn R sock, min assist to donn L sock Toilet Transfer: Minimal assistance;+2 for physical assistance;Stand-pivot;RW Toilet Transfer Details (indicate cue type and reason): simulated bed to chair         Functional mobility during ADLs: +2 for physical assistance;Minimal assistance;Rolling walker;Cueing for sequencing       Vision     Perception     Praxis      Pertinent Vitals/Pain Pain Assessment: 0-10 Pain Score: 8  Pain Location: abdomen Pain Descriptors / Indicators: Sore;Grimacing Pain Intervention(s): Monitored during session;Repositioned;Patient requesting pain meds-RN notified     Hand Dominance  Extremity/Trunk Assessment             Communication     Cognition Arousal/Alertness: Awake/alert Behavior During Therapy: WFL for tasks assessed/performed Overall Cognitive Status: Within Functional Limits for tasks assessed                 General Comments: Pt much more talkative, bright affect.   General Comments       Exercises       Shoulder Instructions      Home  Living                                          Prior Functioning/Environment               OT Diagnosis:     OT Problem List:     OT Treatment/Interventions:      OT Goals(Current goals can be found in the care plan section) Acute Rehab OT Goals Patient Stated Goal: to get back to normal Potential to Achieve Goals: Good  OT Frequency: Min 2X/week   Barriers to D/C:            Co-evaluation              End of Session Equipment Utilized During Treatment: Oxygen Nurse Communication: Patient requests pain meds (HR spikes to 155 with activity, returns to 109-115 with brea)  Activity Tolerance: Patient tolerated treatment well Patient left: in chair;with call bell/phone within reach   Time: 0850-0930 OT Time Calculation (min): 40 min Charges:  OT General Charges $OT Visit: 1 Procedure OT Treatments $Self Care/Home Management : 38-52 mins G-Codes:    [name], Kiet Geer [place] 08/26/2014, 9:40 AM [phone]

## 2014-08-26 NOTE — Progress Notes (Signed)
[facility]/Niranjan Rufener Progress Note 56 Days Post-Op  Subjective: Patient is awake and alert.  Very pleasant.  No distress.  Says her left upper abdomen is sore.  Objective: Vital signs in last 24 hours: Temp:  [98 F (36.7 C)-98.7 F (37.1 C)] 98 F (36.7 C) (05/25 0438) Pulse Rate:  [61-154] 113 (05/25 0438) Resp:  [14-30] 27 (05/25 0438) BP: (76-118)/(42-86) 90/53 mmHg (05/25 0438) SpO2:  [93 %-100 %] 96 % (05/25 0000) Weight:  [98 kg (216 lb 0.8 oz)] 98 kg (216 lb 0.8 oz) (05/25 0500) Last BM Date: 08/21/14  Intake/Output from previous day: 05/24 0701 - 05/25 0700 In: 770.1 [NG/GT:360.1; IV Piggyback:150; TPN:260] Out: 3600 [Drains:1550] Intake/Output this shift:    General: No acute distress.  Wants to eat  Lungs: Clear to auscultation  Abd: Soft, good bowel sounds, RUQ drain to gravity drainage and over 1100 cc yesterday out.  No PO intake.  G-tube output 450 cc.  Minimally tender  Extremities: No changes  Neuro: Intact  Lab Results:  (wbc:2,hgb:2,hct:2,plt:2) BMET  Recent Labs  08/25/14 0525 08/25/14 0747  NA 138 139  K 4.5 4.5  CL 101 100*  CO2 28 27  GLUCOSE 83 90  BUN 34* 34*  CREATININE 5.10* 5.05*  CALCIUM 8.3* 8.1*   PT/INR No results for input(s): LABPROT, INR in the last 72 hours. ABG No results for input(s): PHART, HCO3 in the last 72 hours.  Invalid input(s): PCO2, PO2  Studies/Results: No results found.  Anti-infectives: Anti-infectives    Start     Dose/Rate Route Frequency Ordered Stop   08/21/14 1430  cefTAZidime (FORTAZ) 1 g in dextrose 5 % 50 mL IVPB     1 g 100 mL/hr over 30 Minutes Intravenous  Once 08/21/14 1426 08/21/14 1547   08/18/14 1300  metroNIDAZOLE (FLAGYL) IVPB 500 mg     500 mg 100 mL/hr over 60 Minutes Intravenous 3 times per day 08/18/14 1045     08/18/14 1200  cefTAZidime (FORTAZ) 2 g in dextrose 5 % 50 mL IVPB     2 g 100 mL/hr over 30 Minutes Intravenous Every T-Th-Sa (Hemodialysis) 08/18/14 1054     08/12/14 1449  ceFAZolin (ANCEF) 2-3 GM-% IVPB SOLR    Comments:  Troy Sine   : cabinet override      08/12/14 1449 08/12/14 1629   08/12/14 1130  ceFAZolin (ANCEF) IVPB 2 g/50 mL premix    Comments:  Hang ON CALL to xray 5/11   2 g 100 mL/hr over 30 Minutes Intravenous To Radiology 08/12/14 1035 08/12/14 1520   08/04/14 1200  vancomycin (VANCOCIN) 50 mg/mL oral solution 125 mg  Status:  Discontinued     125 mg Oral 4 times per day 08/04/14 0851 08/08/14 1229   07/18/14 1800  vancomycin (VANCOCIN) 50 mg/mL oral solution 125 mg  Status:  Discontinued     125 mg Oral 4 times per day 07/18/14 1455 08/04/14 0845   07/17/14 1830  vancomycin (VANCOCIN) IVPB 750 mg/150 ml premix     750 mg 150 mL/hr over 60 Minutes Intravenous  Once 07/17/14 1818 07/18/14 0024   07/15/14 1400  metroNIDAZOLE (FLAGYL) tablet 500 mg  Status:  Discontinued     500 mg Oral 3 times per day 07/15/14 1202 07/18/14 1533   07/13/14 2200  piperacillin-tazobactam (ZOSYN) IVPB 2.25 g  Status:  Discontinued     2.25 g 100 mL/hr over 30 Minutes Intravenous 3 times per day 07/13/14 1318 07/24/14 1138   07/13/14  2000  fluconazole (DIFLUCAN) IVPB 200 mg  Status:  Discontinued     200 mg 100 mL/hr over 60 Minutes Intravenous Every 24 hours 07/13/14 1318 07/19/14 1027   07/07/14 1200  vancomycin (VANCOCIN) IVPB 1000 mg/200 mL premix  Status:  Discontinued     1,000 mg 200 mL/hr over 60 Minutes Intravenous Every 24 hours 07/07/14 0937 07/14/14 1712   07/06/14 2000  fluconazole (DIFLUCAN) IVPB 400 mg  Status:  Discontinued     400 mg 100 mL/hr over 120 Minutes Intravenous Every 24 hours 07/06/14 1507 07/13/14 1318   07/06/14 1800  piperacillin-tazobactam (ZOSYN) IVPB 2.25 g  Status:  Discontinued     2.25 g 100 mL/hr over 30 Minutes Intravenous 4 times per day 07/06/14 1505 07/13/14 1318   07/05/14 2000  fluconazole (DIFLUCAN) IVPB 200 mg  Status:  Discontinued     200 mg 100 mL/hr over 60 Minutes Intravenous Every 24 hours  07/05/14 0757 07/06/14 1507   07/05/14 1400  piperacillin-tazobactam (ZOSYN) IVPB 2.25 g  Status:  Discontinued     2.25 g 100 mL/hr over 30 Minutes Intravenous 3 times per day 07/05/14 0749 07/06/14 1505   07/02/14 1800  vancomycin (VANCOCIN) IVPB 1000 mg/200 mL premix  Status:  Discontinued     1,000 mg 200 mL/hr over 60 Minutes Intravenous Every 24 hours 07/01/14 1858 07/05/14 0801   07/01/14 2200  piperacillin-tazobactam (ZOSYN) IVPB 3.375 g  Status:  Discontinued     3.375 g 100 mL/hr over 30 Minutes Intravenous 4 times per day 07/01/14 1858 07/05/14 0749   07/01/14 2000  fluconazole (DIFLUCAN) IVPB 400 mg  Status:  Discontinued     400 mg 100 mL/hr over 120 Minutes Intravenous Every 24 hours 07/01/14 1858 07/05/14 0757   07/01/14 1400  fluconazole (DIFLUCAN) IVPB 200 mg  Status:  Discontinued     200 mg 100 mL/hr over 60 Minutes Intravenous Every 24 hours 07/01/14 1330 07/01/14 1853   07/01/14 1000  piperacillin-tazobactam (ZOSYN) IVPB 2.25 g  Status:  Discontinued     2.25 g 100 mL/hr over 30 Minutes Intravenous Every 8 hours 07/01/14 0952 07/01/14 1853   07/01/14 1000  vancomycin (VANCOCIN) 500 mg in sodium chloride 0.9 % 100 mL IVPB     500 mg 100 mL/hr over 60 Minutes Intravenous  Once 07/01/14 0952 07/01/14 1126   06/30/14 1200  vancomycin (VANCOCIN) IVPB 1000 mg/200 mL premix     1,000 mg 200 mL/hr over 60 Minutes Intravenous  Once 06/30/14 0944 06/30/14 1403   06/30/14 1200  ceFEPIme (MAXIPIME) 2 g in dextrose 5 % 50 mL IVPB     2 g 100 mL/hr over 30 Minutes Intravenous  Once 06/30/14 0944 06/30/14 1425   06/30/14 0100  vancomycin (VANCOCIN) 2,000 mg in sodium chloride 0.9 % 500 mL IVPB     2,000 mg 250 mL/hr over 120 Minutes Intravenous  Once 06/30/14 0048 06/30/14 0525   06/30/14 0100  ceFEPIme (MAXIPIME) 2 g in dextrose 5 % 50 mL IVPB     2 g 100 mL/hr over 30 Minutes Intravenous  Once 06/30/14 0048 06/30/14 0322   06/27/14 0600  cefUROXime (ZINACEF) 1.5 g in  dextrose 5 % 50 mL IVPB     1.5 g 100 mL/hr over 30 Minutes Intravenous On call to O.R. 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  Patient not placed back on sandostatin.  LOS: 70 days   Marta LamasJames O. Gae BonWyatt, III, MD, FACS [phone]--pager [phone]--[facility] 08/26/2014

## 2014-08-26 NOTE — Progress Notes (Signed)
[name] Progress Note  Assessment/Plan: 1. New ESRD 1. On TTS schedule as inpatient 2. No permanent access, using TDC 3. CLIP on hold as still significant inpatient issues 4. Holding on permanent access for now, AVF from 01/28/14; will need to address prior to DC 1. Last seen by [facility] 06/2014; likely needing revision [facility] 5. 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 Vol Status 1. Using Bed Weights currently, try to transition to standing 2. Still appears volume overload, cont gentle UF 3. Try for 3.5L UF next Tx 3. Pseudomonal pelvic abscess on Ceftaz/Flagyl, hx/o C Diff 4. CVA acute by MRI 3/19 5. Perf pyloric ulcer SP exlap, w duod fistula to suction high output off of octreotide 6. Nutrition w G-J tube on low dose TF's (VItal)+trying to wean TNA-  7. C diff - s/p po Vanc - on flagyl with ABX 8. Anemia  1. Inc aranesp to 200 qThurs 2. Finished Fe load 5/21 3. Hb 7.1, if not inc tomorrow can transfuse with HD 9. MBD - iPTH 130 5/12, low P resolved 10. 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 Vitamin C deficiency - getting 1000 mg IV daily w TNA, repeat level ordered for Stephanie LlanoMon   [name] MD  08/26/2014, 9:53 AM    Subjective:   HD yesterday, had SVT vs ST during; BP was stable; 2.05L UF.  Bed Weight, no post weight.     Objective Filed Vitals:   08/26/14 0000 08/26/14 0438 08/26/14 0500 08/26/14 0748  BP: 85/42 90/53    Pulse: 110 113    Temp: 98.6 F (37 C) 98 F (36.7 C)    TempSrc: Oral Oral    Resp: 18 27    Height:      Weight:   98 kg (216 lb 0.8 oz)   SpO2: 96%   96%   Physical Exam General: Walking with PT Heart:tachy reg Lungs:clear to A/P now Abdomen: obese, anasarca with dependent back/buttock edema Extremities: 1-2+ dependent edema Dialysis Access: right IJ   Dialysis Orders:  Additional Objective Labs: Basic Metabolic Panel:  Recent Labs Lab 08/23/14 0415 08/24/14 0520  08/25/14 0525 08/25/14 0747  NA 137  --  138 139  K 4.3  --  4.5 4.5  CL 99*  --  101 100*  CO2 30  --  28 27  GLUCOSE 120*  --  83 90  BUN 14  --  34* 34*  CREATININE 2.43*  --  5.10* 5.05*  CALCIUM 8.3*  --  8.3* 8.1*  PHOS 3.3 5.0*  --  6.1*   Liver Function Tests:  Recent Labs Lab 08/20/14 0500 08/21/14 0253  08/21/14 1526 08/25/14 0525 08/25/14 0747  AST 20 25  --   --  17  --   ALT 8* 9*  --   --  9*  --   ALKPHOS 139* 151*  --   --  127*  --   BILITOT 0.9 0.8  --   --  1.3*  --   PROT 6.2* 6.4*  --   --  6.7  --   ALBUMIN 1.5* 1.6*  < > 2.0* 2.0* 2.1*  < > = values in this interval not displayed.CBC:  Recent Labs Lab 08/22/14 0530 08/23/14 0415 08/24/14 0520 08/25/14 0525 08/26/14 0648  WBC 14.4* 15.3* 15.7* 13.6* 13.2*  NEUTROABS  --   --  12.5*  --   --  HGB 7.9* 7.8* 7.6* 7.9* 7.1*  HCT 25.5* 26.2* 25.0* 25.6* 24.1*  MCV 93.4 94.6 96.2 95.9 97.2  PLT 281 307 309 357 327   Blood Culture    Component Value Date/Time   SDES ABSCESS PELVIS 08/17/2014 1451   SPECREQUEST NONE 08/17/2014 1451   CULT  08/17/2014 1451    NO GROWTH 3 DAYS Performed at [facility]    REPTSTATUS 08/21/2014 FINAL 08/17/2014 1451    Cardiac Enzymes:  Recent Labs Lab 08/23/14 1944 08/24/14 0221 08/24/14 0630  TROPONINI 0.04* 0.03 0.03   CBG:  Recent Labs Lab 08/24/14 0012 08/24/14 0605 08/24/14 1631 08/25/14 0110 08/25/14 1741  GLUCAP 115* 136* 139* 122* 124*    Medications: . sodium chloride 10 mL/hr at 08/19/14 0950  . TPN (CLINIMIX) Adult without lytes 40 mL/hr at 08/25/14 1800  . TPN (CLINIMIX) Adult without lytes     . antiseptic oral rinse  7 mL Mouth Rinse q12n4p  . budesonide (PULMICORT) nebulizer solution  0.25 mg Nebulization BID  . cefTAZidime (FORTAZ)  IV  2 g Intravenous Q T,Th,Sa-HD  . chlorhexidine  15 mL Mouth Rinse BID  . darbepoetin (ARANESP) injection - DIALYSIS  100 mcg Intravenous Q Thu-HD  . feeding supplement (VITAL AF  1.2 CAL)  1,000 mL Per Tube Q24H  . levothyroxine  12.5 mcg Intravenous Daily  . metoprolol  2.5 mg Intravenous 4 times per day  . metronidazole  500 mg Intravenous 3 times per day  . sodium chloride  10-40 mL Intracatheter Q12H

## 2014-08-26 NOTE — Progress Notes (Signed)
[place] for TPN Indication:  High output duodenal fistula  Allergies  Allergen Reactions  . 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    Patient Measurements: Height: 5' 6" (167.6 cm) Weight: 216 lb 0.8 oz (98 kg) IBW/kg (Calculated) : 59.3 Adjusted Body Weight: 71.2kg  Vital Signs: Temp: 98 F (36.7 C) (05/25 0438) Temp Source: Oral (05/25 0438) BP: 90/53 mmHg (05/25 0438) Pulse Rate: 113 (05/25 0438) Intake/Output from previous day: 05/24 0701 - 05/25 0700 In: 770.1 [NG/GT:360.1; IV Piggyback:150; TPN:260] Out: 3600 [Drains:1550]  Labs:  Recent Labs  08/24/14 0520 08/25/14 0525 08/26/14 0648  WBC 15.7* 13.6* PENDING  HGB 7.6* 7.9* 7.1*  HCT 25.0* 25.6* 24.1*  PLT 309 357 327    Recent Labs  08/24/14 0520 08/25/14 0525 08/25/14 0747  NA  --  138 139  K  --  4.5 4.5  CL  --  101 100*  CO2  --  28 27  GLUCOSE  --  83 90  BUN  --  34* 34*  CREATININE  --  5.10* 5.05*  CALCIUM  --  8.3* 8.1*  MG 2.0  --   --   PHOS 5.0*  --  6.1*  PROT  --  6.7  --   ALBUMIN  --  2.0* 2.1*  AST  --  17  --   ALT  --  9*  --   ALKPHOS  --  127*  --   BILITOT  --  1.3*  --   PREALBUMIN 9.7*  --   --   TRIG 131  --   --    Estimated Creatinine Clearance: 14.3 mL/min (by C-G formula based on Cr of 5.05).   Recent Labs  08/24/14 1631 08/25/14 0110 08/25/14 1741  GLUCAP 139* 122* 124*   Insulin Requirements in the past 24 hours:  CBGs/SSI d/c 5/24  Current Nutrition:  Clinimix 5/15 at 7m/hr provides 682 kcal + 48gm protein Vital AF at 368mhr provides 1008 kcal + 63gm protein TF + TPN provides 1690 kcal + 111 gm protein   Nutritional Goals: per RD assessment 5/24 2100-2300 kCal, 120-140 grams of protein per day  Assessment: [id number] with complicated hospital course. S/p closure perforated pyloric ulcer, SBR for ischemic necrosis, diffuse peritonitis on 07/01/14. Pharmacy consulted to provide TPN 4/1-4/15 for nutrition support for  prolonged ileus. Pt then transitioned to po diet + supplements. 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  GIGT:XMIWOEHOistula. NPO.Octreotide drip 5/10-5/24. Prealbumin remains low at 9.7 but trending up. Pt with Vital AF 1.2 at 35 ml/hr with no residuals. RUQ drain o/p 110045m4h; G-tube 450 ml/24h. Last BM 5/20. Vit C level 0.2 (5/11) - adding 1gm extra to TPN since 5/18 (plan for 14 days total). Repeat Vit C level 5/23 - pending.  Endo:Hx DM/hypothyroidism. synthroid (new), TSH 7.3. Pt requiring no insulin in TPN and CBGs well controlled for multiple days so SSI/CBGs discontinued 5/24.  Lytes: LEvaristo Burying added and removed in the TPN bag as needed based on labs. 5/24 K 4.5, Phos up to 6.1, Corr Ca 9.6 (Ca x Phos product = 58; goal <55). Continue with electrolyte free formula.  Renal: New ESRD - started 3/17. HD on TTS. NS KVO, got extra HD 5/20 as fluid has been difficult to manage. Renal requested that TPN + lipids rate be reduced to 73m41m to assist with volume management.  Heme: Anemia of ESRD. Hgb down to  7.1, plt ok. Aranesp. S/p ferric gluc for iron 20, tsat 14.  Pulm: Hx asthma/COPD. 2L [place].   Cards: HTN, afib - IV metoprolol scheduled. HR 61-154(ST), BP soft-nl. CHADS-VASc =4 (not good candidate for [facility]). 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   Hepatotobil: 5/24 Alk phos elevated but trending down, T bili up to 1.3. AST/ALT wnl. TG wnl  Neuro: Hx depression / insomnia - new stroke noted on 3/18.   ID: Afebrile, WBC 13.6-trending down, s/p treatment for CDiff and s/p multiple courses of abx during admission. Hep B Ag negative. Currently on Flagyl and Ceftaz for pseudomonal wound infection. ID following.  5/14 wound cx (abdomen): pseudomonas   5/17 ceftazidime >> 5/17 flagyl >>  Best Practices: SCDs, PPI IV  TPN Access: PICC line in IR 4/27  TPN start date: 4/1 >> 4/15; restart 4/27>>  Plan:  - Continue Clinimix 5/15  (electrolyte free formula) at 64m/hr. No lipids as pt is receiving lipids in tube feeds. TPN + TF meeting 92% protein goal and 80% kcal goal. - Continue MVI + TE in TPN - Continue ascorbic acid 1gm in TPN - MD wants a total of 14 days (started in TPN 5/18 so today is D#7/14) - will need to reorder if TPN is discontinued - F/u tolerance of tube feeds and ability to continue to titrate up to goal and discontinue TPN  CSherlon Handing PharmD, BCPS Clinical pharmacist, pager [phone] 08/26/2014 8:06 AM

## 2014-08-26 NOTE — Progress Notes (Signed)
NP made aware that pts heart rate tachy in the 130-140's but holding Metoprolol d/t low BP. Will monitor pt.

## 2014-08-27 DIAGNOSIS — K929 Disease of digestive system, unspecified: Secondary | ICD-10-CM

## 2014-08-27 DIAGNOSIS — R1030 Lower abdominal pain, unspecified: Secondary | ICD-10-CM

## 2014-08-27 DIAGNOSIS — Z515 Encounter for palliative care: Secondary | ICD-10-CM

## 2014-08-27 LAB — GLUCOSE, CAPILLARY
GLUCOSE-CAPILLARY: 157 mg/dL — AB (ref 65–99)
Glucose-Capillary: 112 mg/dL — ABNORMAL HIGH (ref 65–99)

## 2014-08-27 LAB — CBC
HCT: 24.7 % — ABNORMAL LOW (ref 36.0–46.0)
Hemoglobin: 6.8 g/dL — CL (ref 12.0–15.0)
MCH: 28.6 pg (ref 26.0–34.0)
MCHC: 27.5 g/dL — AB (ref 30.0–36.0)
MCV: 103.8 fL — AB (ref 78.0–100.0)
Platelets: 323 10*3/uL (ref 150–400)
RBC: 2.38 MIL/uL — ABNORMAL LOW (ref 3.87–5.11)
RDW: 23.9 % — ABNORMAL HIGH (ref 11.5–15.5)
WBC: 13.9 10*3/uL — AB (ref 4.0–10.5)

## 2014-08-27 LAB — PHOSPHORUS: Phosphorus: 4.4 mg/dL (ref 2.5–4.6)

## 2014-08-27 LAB — COMPREHENSIVE METABOLIC PANEL
ALT: 8 U/L — ABNORMAL LOW (ref 14–54)
AST: 17 U/L (ref 15–41)
Albumin: 1.9 g/dL — ABNORMAL LOW (ref 3.5–5.0)
Alkaline Phosphatase: 108 U/L (ref 38–126)
Anion gap: 9 (ref 5–15)
BUN: 34 mg/dL — ABNORMAL HIGH (ref 6–20)
CO2: 27 mmol/L (ref 22–32)
Calcium: 7.8 mg/dL — ABNORMAL LOW (ref 8.9–10.3)
Chloride: 96 mmol/L — ABNORMAL LOW (ref 101–111)
Creatinine, Ser: 4.56 mg/dL — ABNORMAL HIGH (ref 0.44–1.00)
GFR calc Af Amer: 11 mL/min — ABNORMAL LOW (ref >60)
GFR calc non Af Amer: 10 mL/min — ABNORMAL LOW (ref >60)
Glucose, Bld: 457 mg/dL — ABNORMAL HIGH (ref 65–99)
Potassium: 3.5 mmol/L (ref 3.5–5.1)
Sodium: 132 mmol/L — ABNORMAL LOW (ref 135–145)
Total Bilirubin: 1 mg/dL (ref 0.3–1.2)
Total Protein: 6.6 g/dL (ref 6.5–8.1)

## 2014-08-27 LAB — MAGNESIUM: Magnesium: 1.7 mg/dL (ref 1.7–2.4)

## 2014-08-27 LAB — PREPARE RBC (CROSSMATCH)

## 2014-08-27 MED ORDER — DEXTROSE 5 % IV SOLN
2.0000 g | INTRAVENOUS | Status: DC
  Administered 2014-08-27 – 2014-09-05 (×5): 2 g via INTRAVENOUS
  Filled 2014-08-27 (×7): qty 2

## 2014-08-27 MED ORDER — LEVOTHYROXINE SODIUM 100 MCG IV SOLR
12.5000 ug | Freq: Every day | INTRAVENOUS | Status: DC
  Administered 2014-08-27 – 2014-09-03 (×8): 12.5 ug via INTRAVENOUS
  Filled 2014-08-27 (×8): qty 5

## 2014-08-27 MED ORDER — LEVALBUTEROL HCL 0.63 MG/3ML IN NEBU
0.6300 mg | INHALATION_SOLUTION | RESPIRATORY_TRACT | Status: DC | PRN
  Administered 2014-08-27 – 2014-09-11 (×15): 0.63 mg via RESPIRATORY_TRACT
  Filled 2014-08-27 (×16): qty 3

## 2014-08-27 MED ORDER — METRONIDAZOLE IN NACL 5-0.79 MG/ML-% IV SOLN
500.0000 mg | Freq: Three times a day (TID) | INTRAVENOUS | Status: DC
  Administered 2014-08-27 – 2014-09-08 (×36): 500 mg via INTRAVENOUS
  Filled 2014-08-27 (×39): qty 100

## 2014-08-27 MED ORDER — IPRATROPIUM BROMIDE 0.02 % IN SOLN
0.5000 mg | Freq: Three times a day (TID) | RESPIRATORY_TRACT | Status: DC
  Administered 2014-08-27 (×2): 0.5 mg via RESPIRATORY_TRACT
  Filled 2014-08-27 (×2): qty 2.5

## 2014-08-27 MED ORDER — VITAL AF 1.2 CAL PO LIQD
1000.0000 mL | ORAL | Status: DC
  Administered 2014-08-27 – 2014-09-03 (×8): 1000 mL
  Filled 2014-08-27 (×16): qty 1000

## 2014-08-27 MED ORDER — LEVALBUTEROL HCL 1.25 MG/0.5ML IN NEBU
1.2500 mg | INHALATION_SOLUTION | Freq: Three times a day (TID) | RESPIRATORY_TRACT | Status: DC
  Administered 2014-08-27: 1.25 mg via RESPIRATORY_TRACT
  Filled 2014-08-27 (×6): qty 0.5

## 2014-08-27 MED ORDER — VITAMIN C 500 MG PO TABS
1000.0000 mg | ORAL_TABLET | Freq: Every day | ORAL | Status: AC
  Administered 2014-08-28 – 2014-09-02 (×6): 1000 mg
  Filled 2014-08-27 (×6): qty 2

## 2014-08-27 MED ORDER — ASCORBIC ACID 500 MG/ML IJ SOLN: 1000.0000 mg | Freq: Every day | INTRAMUSCULAR | Status: DC

## 2014-08-27 MED ORDER — ANTICOAGULANT SODIUM CITRATE 4% (200MG/5ML) IV SOLN
5.0000 mL | Freq: Once | Status: AC
  Administered 2014-08-27: 5 mL via INTRAVENOUS
  Filled 2014-08-27: qty 250

## 2014-08-27 MED ORDER — DARBEPOETIN ALFA 200 MCG/0.4ML IJ SOSY
PREFILLED_SYRINGE | INTRAMUSCULAR | Status: AC
Start: 2014-08-27 — End: 2014-08-27
  Administered 2014-08-27: 200 ug
  Filled 2014-08-27: qty 0.4

## 2014-08-27 MED ORDER — SODIUM CHLORIDE 0.9 % IV SOLN: Freq: Once | INTRAVENOUS | Status: DC

## 2014-08-27 NOTE — Progress Notes (Signed)
Pt refuses CPAP. Instructed to call respiratory if she changes her mind.

## 2014-08-27 NOTE — Procedures (Signed)
Patient was seen on dialysis and the procedure was supervised.  BFR 300  Via PC BP is  94/61.   Patient appears to be tolerating treatment well  [name] A 08/27/2014

## 2014-08-27 NOTE — Progress Notes (Addendum)
CRITICAL VALUE ALERT  Critical value received:  Hgb 6.8  Date of notification:  08/27/2014  Time of notification:  0613  Critical value read back: Yes  Nurse who received alert:  Brent BullaJilian Dicky Boer RN  MD notified (1st page):  Dr. Clearence PedSchorr  Time of first page:  0614  MD notified (2nd page):  Time of second page:  Responding MD:  Dr. Clearence PedSchorr  Time MD responded:  [phone]

## 2014-08-27 NOTE — Progress Notes (Signed)
Inpatient Diabetes Program Recommendations  AACE/ADA: New Consensus Statement on Inpatient Glycemic Control (2013)  Target Ranges:  Prepandial:   less than 140 mg/dL      Peak postprandial:   less than 180 mg/dL (1-2 hours)      Critically ill patients:  140 - 180 mg/dL   Reason for Assessment: Hyperglycemia  Lab glucose elevated at 457 mg/dL at [id number] this am.  Please consider Novolog sensitive Q4H. Pharmacy managing addition of glucose in TNA.  Thank you. [name], RD, LDN, CDE Inpatient Diabetes Coordinator [phone]

## 2014-08-27 NOTE — Progress Notes (Signed)
Passing on report to night nurses. Pt came back from dialysis at approximately 1700. Report given to charge nurse. Floor nurse received report from float nurse assigned to pt for day, at 1700.  Nurse attempted to catch pt up on medications that were not given throughout day as pt was in dialysis. Nurse called and spoke with pharmacy and had 3 medications , synthroid, flagyl and fortaz rescheduled for 1900 as these were due when pt was in dialysis. Gave synthroid and flagyl and waiting for fortaz. Passing on information to night nurse. A new bottle of peg tube feeding vital 1.2 was started when pt got to floor. Pt resting with no requests or complaints.

## 2014-08-27 NOTE — Progress Notes (Signed)
Patient [name]      DOB: [birth year]      EAV:[id number]   Palliative Medicine Team at [facility] Progress Note    Subjective: Some abdominal discomfort today.  She denies any N/V.  Reports that she just doesn't feel well today. Overall she is happy with progress she is making.     Filed Vitals:   08/27/14 0900  BP: 109/66  Pulse: 109  Temp: 98.3 F (36.8 C)  Resp: 22   Physical exam: GEN: alert, NAD CV: tachy Abd: soft  CBC    Component Value Date/Time   WBC 13.9* 08/27/2014 0539   WBC 6.8 10/02/2012 1046   RBC 2.38* 08/27/2014 0539   RBC 4.87 10/02/2012 1046   HGB 6.8* 08/27/2014 0539   HGB 13.1 10/02/2012 1046   HCT 24.7* 08/27/2014 0539   HCT 40.3 10/02/2012 1046   PLT 323 08/27/2014 0539   PLT 359 10/02/2012 1046   MCV 103.8* 08/27/2014 0539   MCV 82.6 10/02/2012 1046   MCH 28.6 08/27/2014 0539   MCH 26.8 10/02/2012 1046   MCHC 27.5* 08/27/2014 0539   MCHC 32.5 10/02/2012 1046   RDW 23.9* 08/27/2014 0539   RDW 17.4* 10/02/2012 1046   LYMPHSABS 1.3 08/24/2014 0520   LYMPHSABS 1.9 10/02/2012 1046   MONOABS 1.4* 08/24/2014 0520   MONOABS 0.8 10/02/2012 1046   EOSABS 0.5 08/24/2014 0520   EOSABS 0.2 10/02/2012 1046   BASOSABS 0.0 08/24/2014 0520   BASOSABS 0.1 10/02/2012 1046    CMP     Component Value Date/Time   NA 132* 08/27/2014 0539   NA 139 10/02/2012 1046   K 3.5 08/27/2014 0539   K 4.4 10/02/2012 1046   CL 96* 08/27/2014 0539   CO2 27 08/27/2014 0539   CO2 25 10/02/2012 1046   GLUCOSE 457* 08/27/2014 0539   GLUCOSE 110 10/02/2012 1046   BUN 34* 08/27/2014 0539   BUN 35.2* 10/02/2012 1046   CREATININE 4.56* 08/27/2014 0539   CREATININE 2.5* 10/02/2012 1046   CALCIUM 7.8* 08/27/2014 0539   CALCIUM 9.8 10/02/2012 1046   PROT 6.6 08/27/2014 0539   PROT 8.4* 10/02/2012 1046   ALBUMIN 1.9* 08/27/2014 0539   ALBUMIN 3.1* 10/02/2012 1046   AST 17 08/27/2014 0539   AST 9 10/02/2012 1046   ALT 8* 08/27/2014 0539   ALT <6  Repeated and Verified 10/02/2012 1046   ALKPHOS 108 08/27/2014 0539   ALKPHOS 108 10/02/2012 1046   BILITOT 1.0 08/27/2014 0539   BILITOT 0.21 10/02/2012 1046   GFRNONAA 10* 08/27/2014 0539   GFRAA 11* 08/27/2014 0539     Assessment and plan: 59 yo female with multiple medical problems including ESRD on HD, COPD, C diff, SVT, perforated ulcer, ischemic ileum, enteric fistula requiring TPN, pelvic abscess. Palliative consulted for GOC.   1. Code Status- Full Code  2. GOC- See prior documentation  Indications are that output from fistula decreasing.  She has had prolonged hospital course and while feeling down today, overall feels like she is heading on right trajectory.  She was appreciative of our visit today.  Will continue to follow along intermittently while inpatient.  Feel free to contact us sooner if questions arise.   3. Symptom Management 1. Abdominal Pain- controlled with PRN hydromorphone. On fairly large dose. If feel she is getting too sedated, can decrease to 0.5mg .    4. Psychosocial/Spiritual. Has 2 children. Lived at home with her daughter [name]. Son also involved in her care.  Enjoys watching baseball. Spirituality very important to her.   Orvis BrillAaron J. Sahand Gosch D.O. Palliative Medicine [facility]  Pager: [phone] Team Phone: [phone]

## 2014-08-27 NOTE — Progress Notes (Signed)
PARENTERAL NUTRITION CONSULT  Pharmacy Consult for TPN Indication:  High output duodenal fistula  Allergies  Allergen Reactions  . 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    Patient Measurements: Height:  (167.6 cm) Weight: 216 lb 7.9 oz (98.2 kg) IBW/kg (Calculated) : 59.3 Adjusted Body Weight: 71.2kg  Vital Signs: Temp: 98.3 F (36.8 C) (05/26 0900) Temp Source: Oral (05/26 0900) BP: 109/66 mmHg (05/26 0900) Pulse Rate: 109 (05/26 0900) Intake/Output from previous day: 05/25 0701 - 05/26 0700 In: 995 [NG/GT:455; IV Piggyback:100; TPN:440] Out: 1000 [Drains:1000]  Labs:  Recent Labs  08/25/14 0525 08/26/14 0648 08/27/14 0539  WBC 13.6* 13.2* 13.9*  HGB 7.9* 7.1* 6.8*  HCT 25.6* 24.1* 24.7*  PLT 357 327 323    Recent Labs  08/25/14 0525 08/25/14 0747 08/27/14 0539  NA 138 139 132*  K 4.5 4.5 3.5  CL 101 100* 96*  CO2 GLUCOSE 83 90 457*  BUN 34* 34* 34*  CREATININE 5.10* 5.05* 4.56*  CALCIUM 8.3* 8.1* 7.8*  MG  --   --  1.7  PHOS  --  6.1* 4.4  PROT 6.7  --  6.6  ALBUMIN 2.0* 2.1* 1.9*  AST 17  --  17  ALT 9*  --  8*  ALKPHOS 127*  --  108  BILITOT 1.3*  --  1.0   Estimated Creatinine Clearance: 15.9 mL/min (by C-G formula based on Cr of 4.56).   Recent Labs  08/24/14 1631 08/25/14 0110 08/25/14 1741  GLUCAP 139* 122* 124*   Insulin Requirements in the past 24 hours:  CBGs/SSI d/c 5/24  Current Nutrition:  Clinimix 5/15 at 21ml/hr provides 682 kcal + 48gm protein Vital AF at 74ml/hr provides 1008 kcal + 63gm protein TF + TPN provides 1690 kcal + 111 gm protein   Nutritional Goals: per RD assessment 5/24 2100-2300 kCal, 120-140 grams of protein per day  Assessment: 65 YOF with complicated hospital course. S/p closure perforated pyloric ulcer, SBR for ischemic necrosis, diffuse peritonitis on 07/01/14. Pharmacy consulted to provide TPN 4/1-4/15 for nutrition support for prolonged ileus. Pt then transitioned to po diet + supplements. 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  FA:OZHYQMVH fistula. NPO.Octreotide drip 5/10-5/24. Prealbumin remains low at 9.7 but trending up. Pt with Vital AF 1.2 at 35 ml/hr with no residuals. Drain o/p 650/24h. Plans to advance TF to 55 ml/hr today (this is ~73% of goal) - spoke with CCS PA and ok to d/c TPN. Vit C level 0.2 (5/11) - adding 1gm extra to TPN since 5/18 (plan for 14 days total). Repeat Vit C level 5/23 - pending.  Endo:Hx DM/hypothyroidism. synthroid (new), TSH 7.3. Pt requiring no insulin in TPN and CBGs well controlled for multiple days so SSI/CBGs discontinued 5/24. A.m gluc 5/26 up to 457 - appears that labs drawn from port where TPN was running and likely contaminated.  Lytes: Unable to assess lytes as appears labs are contaminated with TPN  Renal: New ESRD - started 3/17. HD on TTS. NS KVO  Heme: Anemia of ESRD. Hgb down to 6.8 (PRBC x 2 5/26), plt ok. Aranesp. S/p ferric gluc for iron 20, tsat 14.  Pulm: Hx asthma/COPD. 3L [facility].   Cards: HTN, afib - IV metoprolol scheduled. HR 57-128 (ST), BP soft-nl. CHADS-VASc =4 (not good candidate for [facility]). 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   Hepatotobil: LFTs wnl. TG wnl  Neuro: Hx depression / insomnia - new stroke noted on 3/18.   ID: Afebrile, WBC 13.9 (stable), s/p treatment for CDiff and s/p multiple courses of abx during admission. Hep B Ag negative. Currently on Flagyl and Ceftaz for pseudomonal wound infection. ID following.  5/14 wound cx (abd): pseudomonas   5/17 ceftazidime >> 5/17 flagyl >>  Best Practices: SCDs, PPI IV  TPN Access: PICC line in IR 4/27  TPN start date: 4/1 >> 4/15; restart 4/27>>  Plan:  - D/c TPN past current bag when current bag finishes - since pt at such low rate and on TF, TPN will not need to be weaned - Change ascorbic acid to 1gm per tube daily x 6 more days (MD wants a total of 14 days) - F/u pending vit C level - D/c TPN  labs and orders   Christoper Fabianaron Charonda Hefter, PharmD, BCPS Clinical pharmacist, pager [phone]  08/27/2014 10:01 AM

## 2014-08-27 NOTE — Progress Notes (Signed)
Nurse Anah Billard received report from nurse Ayaba as she is leaving and dialysis nurse as pt just got back to floor. Will assess pt and monitor.

## 2014-08-27 NOTE — Progress Notes (Signed)
[facility] Progress Note  Assessment/Plan: 1. New ESRD 1. On TTS schedule as inpatient 2. No permanent access, using TDC 3. CLIP on hold as still significant inpatient issues 4. Holding on permanent access for now, AVF from 01/28/14; will need to address prior to DC 1. Last seen by [facility] 06/2014; likely needing revision [facility]. 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 Vol Status 1. Using Bed Weights currently, try to transition to standing 2. Still appears volume overload, cont gentle UF 3. Try for 3.5L UF today Tx 3. Pseudomonal pelvic abscess on Ceftaz/Flagyl, hx/o C Diff 4. CVA acute by MRI 3/19 5. Perf pyloric ulcer SP exlap, w duod fistula to suction high output off of octreotide, CCS following 6. Nutrition w G-J tube on TF's (VItal)+trying to wean TNA-  7. C diff - s/p po Vanc - on flagyl with ABX 8. Anemia  1. aranesp to 200 qThurs 2. Finished Fe load 5/21 3. Transfuse 2u PRBC  9. MBD - iPTH 130 5/12, low P resolved  10. 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 Vitamin C deficiency - getting 1000 mg IV daily w TNA, repeat level ordered for [name]   Latoya Diskin MD  08/27/2014, 9:37 AM    Subjective:   For HD today.  No new issues.     Objective Filed Vitals:   08/27/14 0627 08/27/14 0723 08/27/14 0800 08/27/14 0900  BP: 95/57  94/67 109/66  Pulse: 106  103 109  Temp:    98.3 F (36.8 C)  TempSrc:    Oral  Resp: 16  20 22   Height:      Weight:      SpO2: 100% 99% 100% 100%   Physical Exam General: Walking with PT Heart:tachy reg Lungs:clear to A/P now Abdomen: obese, anasarca with dependent back/buttock edema Extremities: 1-2+ dependent edema Dialysis Access: right IJ   Dialysis Orders:  Additional Objective Labs: Basic Metabolic Panel:  Recent Labs Lab 08/24/14 0520 08/25/14 0525 08/25/14 0747 08/27/14 0539  NA  --  138 139 132*  K  --  4.5 4.5 3.5  CL  --  101 100* 96*  CO2   --  28 27 27   GLUCOSE  --  83 90 457*  BUN  --  34* 34* 34*  CREATININE  --  5.10* 5.05* 4.56*  CALCIUM  --  8.3* 8.1* 7.8*  PHOS 5.0*  --  6.1* 4.4   Liver Function Tests:  Recent Labs Lab 08/21/14 0253  08/25/14 0525 08/25/14 0747 08/27/14 0539  AST 25  --  17  --  17  ALT 9*  --  9*  --  8*  ALKPHOS 151*  --  127*  --  108  BILITOT 0.8  --  1.3*  --  1.0  PROT 6.4*  --  6.7  --  6.6  ALBUMIN 1.6*  < > 2.0* 2.1* 1.9*  < > = values in this interval not displayed.CBC:  Recent Labs Lab 08/23/14 0415 08/24/14 0520 08/25/14 0525 08/26/14 0648 08/27/14 0539  WBC 15.3* 15.7* 13.6* 13.2* 13.9*  NEUTROABS  --  12.5*  --   --   --   HGB 7.8* 7.6* 7.9* 7.1* 6.8*  HCT 26.2* 25.0* 25.6* 24.1* 24.7*  MCV 94.6 96.2 95.9 97.2 103.8*  PLT 307 309 357 327 323   Blood Culture    Component Value Date/Time   SDES  ABSCESS PELVIS 08/17/2014 1451   SPECREQUEST NONE 08/17/2014 1451   CULT  08/17/2014 1451    NO GROWTH 3 DAYS Performed at [facility]    REPTSTATUS 08/21/2014 FINAL 08/17/2014 1451    Cardiac Enzymes:  Recent Labs Lab 08/23/14 1944 08/24/14 0221 08/24/14 0630  TROPONINI 0.04* 0.03 0.03   CBG:  Recent Labs Lab 08/24/14 0012 08/24/14 0605 08/24/14 1631 08/25/14 0110 08/25/14 1741  GLUCAP 115* 136* 139* 122* 124*    Medications: . sodium chloride 10 mL/hr at 08/19/14 0950  . TPN (CLINIMIX) Adult without lytes 40 mL/hr at 08/26/14 1810   . antiseptic oral rinse  7 mL Mouth Rinse q12n4p  . budesonide (PULMICORT) nebulizer solution  0.25 mg Nebulization BID  . cefTAZidime (FORTAZ)  IV  2 g Intravenous Q T,Th,Sa-HD  . chlorhexidine  15 mL Mouth Rinse BID  . darbepoetin (ARANESP) injection - DIALYSIS  200 mcg Intravenous Q Thu-HD  . feeding supplement (VITAL AF 1.2 CAL)  1,000 mL Per Tube Q24H  . levothyroxine  12.5 mcg Intravenous Daily  . metoprolol  2.5 mg Intravenous 4 times per day  . metronidazole  500 mg Intravenous 3 times per day  .  sodium chloride  10-40 mL Intracatheter Q12H

## 2014-08-27 NOTE — Progress Notes (Signed)
Critical Hgb 6.8 called to floor, Dr. Clearence PedSchorr aware, Type and screen ordered and sent, patient ordered for 1 unit blood, will pass to oncoming shift and continue to monitor closely.

## 2014-08-27 NOTE — Progress Notes (Signed)
[name], female   DOB: 1955-09-22, 59 y.o.   MRN: [id number]     [address] [place], [place] [address]    Phone: [phone] FAX: [phone]     Subjective: Sore.  Doesn't feel good.  hgb down to 6.8.  Denies nausea or vomiting.  G tube and drain output are significantly down.  VSS.  Afebrile.  Objective:  Vital signs:  Filed Vitals:   08/27/14 0502 08/27/14 0514 08/27/14 0627 08/27/14 0723  BP: 1$Rem'02/74 91/60 95/57 'ehie$   Pulse: 105 117 106   Temp:      TempSrc:      Resp: $Remo'19 17 16   'bvqVK$ Height:      Weight:      SpO2: 100% 100% 100% 99%    Last BM Date: 08/26/14  Intake/Output   Yesterday:  05/25 0701 - 05/26 0700 In: 995 [NG/GT:455; IV Piggyback:100; TPN:440] Out: 450 [Drains:450] This shift:    I/O last 3 completed shifts: In: 1120 [NG/GT:490; IV Piggyback:150] Out: 1200 [Drains:1200]    Physical Exam: General: Pt awake/alert/oriented x4 in no acute distress  Abdomen: Soft. Nondistended. Non tender. Midline wound is dry, did not eval. Red rubber drain with bilious enteric content. G tube with bilious output. TF at 19ml/hr. No evidence of peritonitis. No incarcerated hernias.    Problem List:   Principal Problem:   Acute respiratory failure with hypoxia Active Problems:   Cerebral thrombosis with cerebral infarction   Hypertension   ESRD needing dialysis   Obesity (BMI 30-39.9)   Depression   Left renal mass   Chronic diastolic heart failure   I7O6 influenza   Tobacco use disorder   Renal failure (ARF), acute on chronic   Hyperlipidemia   PSVT (paroxysmal supraventricular tachycardia)   SOB (shortness of breath)   Perforated gastric ulcer   Abnormal TSH   History of intermediate V/Q scan   HIT (heparin-induced thrombocytopenia)   Fistula   Abdominal pain   Palliative care encounter   ESRD (end stage renal disease)   SVT (supraventricular tachycardia)    Right heart failure   Duodenal anastomotic leak   Protein calorie malnutrition   Itching   Aortic aneurysm   Protein-calorie malnutrition   Sepsis    Results:   Labs: Results for orders placed or performed during the hospital encounter of 06/17/14 (from the past 48 hour(s))  Glucose, capillary     Status: Abnormal   Collection Time: 08/25/14  5:41 PM  Result Value Ref Range   Glucose-Capillary 124 (H) 65 - 99 mg/dL  CBC     Status: Abnormal   Collection Time: 08/26/14  6:48 AM  Result Value Ref Range   WBC 13.2 (H) 4.0 - 10.5 K/uL    Comment: WHITE COUNT CONFIRMED ON SMEAR   RBC 2.48 (L) 3.87 - 5.11 MIL/uL   Hemoglobin 7.1 (L) 12.0 - 15.0 g/dL   HCT 24.1 (L) 36.0 - 46.0 %   MCV 97.2 78.0 - 100.0 fL   MCH 28.6 26.0 - 34.0 pg   MCHC 29.5 (L) 30.0 - 36.0 g/dL   RDW 23.3 (H) 11.5 - 15.5 %   Platelets 327 150 - 400 K/uL  Magnesium     Status: None   Collection Time: 08/27/14  5:39 AM  Result Value Ref Range   Magnesium 1.7 1.7 - 2.4 mg/dL  Phosphorus     Status: None  Collection Time: 08/27/14  5:39 AM  Result Value Ref Range   Phosphorus 4.4 2.5 - 4.6 mg/dL  CBC     Status: Abnormal   Collection Time: 08/27/14  5:39 AM  Result Value Ref Range   WBC 13.9 (H) 4.0 - 10.5 K/uL   RBC 2.38 (L) 3.87 - 5.11 MIL/uL   Hemoglobin 6.8 (LL) 12.0 - 15.0 g/dL    Comment: REPEATED TO VERIFY CRITICAL RESULT CALLED TO, READ BACK BY AND VERIFIED WITH: J.Marigene Ehlers [id number] 08/27/14 M.CAMPBELL    HCT 24.7 (L) 36.0 - 46.0 %   MCV 103.8 (H) 78.0 - 100.0 fL   MCH 28.6 26.0 - 34.0 pg   MCHC 27.5 (L) 30.0 - 36.0 g/dL   RDW 23.9 (H) 11.5 - 15.5 %   Platelets 323 150 - 400 K/uL  Comprehensive metabolic panel     Status: Abnormal   Collection Time: 08/27/14  5:39 AM  Result Value Ref Range   Sodium 132 (L) 135 - 145 mmol/L    Comment: DELTA CHECK NOTED   Potassium 3.5 3.5 - 5.1 mmol/L    Comment: DELTA CHECK NOTED   Chloride 96 (L) 101 - 111 mmol/L   CO2 27 22 - 32 mmol/L   Glucose, Bld  457 (H) 65 - 99 mg/dL   BUN 34 (H) 6 - 20 mg/dL   Creatinine, Ser 4.56 (H) 0.44 - 1.00 mg/dL   Calcium 7.8 (L) 8.9 - 10.3 mg/dL   Total Protein 6.6 6.5 - 8.1 g/dL   Albumin 1.9 (L) 3.5 - 5.0 g/dL   AST 17 15 - 41 U/L   ALT 8 (L) 14 - 54 U/L   Alkaline Phosphatase 108 38 - 126 U/L   Total Bilirubin 1.0 0.3 - 1.2 mg/dL   GFR calc non Af Amer 10 (L) >60 mL/min   GFR calc Af Amer 11 (L) >60 mL/min    Comment: (NOTE) The eGFR has been calculated using the CKD EPI equation. This calculation has not been validated in all clinical situations. eGFR's persistently <60 mL/min signify possible Chronic Kidney Disease.    Anion gap 9 5 - 15    Imaging / Studies: No results found.  Medications / Allergies:  Scheduled Meds: . antiseptic oral rinse  7 mL Mouth Rinse q12n4p  . budesonide (PULMICORT) nebulizer solution  0.25 mg Nebulization BID  . cefTAZidime (FORTAZ)  IV  2 g Intravenous Q T,Th,Sa-HD  . chlorhexidine  15 mL Mouth Rinse BID  . darbepoetin (ARANESP) injection - DIALYSIS  200 mcg Intravenous Q Thu-HD  . feeding supplement (VITAL AF 1.2 CAL)  1,000 mL Per Tube Q24H  . levothyroxine  12.5 mcg Intravenous Daily  . metoprolol  2.5 mg Intravenous 4 times per day  . metronidazole  500 mg Intravenous 3 times per day  . sodium chloride  10-40 mL Intracatheter Q12H   Continuous Infusions: . sodium chloride 10 mL/hr at 08/19/14 0950  . TPN (CLINIMIX) Adult without lytes 40 mL/hr at 08/26/14 1810   PRN Meds:.bisacodyl, camphor-menthol, diphenhydrAMINE, diphenhydrAMINE-zinc acetate, HYDROmorphone (DILAUDID) injection, levalbuterol, ondansetron (ZOFRAN) IV, sodium chloride  Antibiotics: Anti-infectives    Start     Dose/Rate Route Frequency Ordered Stop   08/21/14 1430  cefTAZidime (FORTAZ) 1 g in dextrose 5 % 50 mL IVPB     1 g 100 mL/hr over 30 Minutes Intravenous  Once 08/21/14 1426 08/21/14 1547   08/18/14 1300  metroNIDAZOLE (FLAGYL) IVPB 500 mg     500 mg 100  mL/hr over 60  Minutes Intravenous 3 times per day 08/18/14 1045     08/18/14 1200  cefTAZidime (FORTAZ) 2 g in dextrose 5 % 50 mL IVPB     2 g 100 mL/hr over 30 Minutes Intravenous Every T-Th-Sa (Hemodialysis) 08/18/14 1054     08/12/14 1449  ceFAZolin (ANCEF) 2-3 GM-% IVPB SOLR    Comments:  Shaaron Adler   : cabinet override      08/12/14 1449 08/12/14 1629   08/12/14 1130  ceFAZolin (ANCEF) IVPB 2 g/50 mL premix    Comments:  Hang ON CALL to xray 5/11   2 g 100 mL/hr over 30 Minutes Intravenous To Radiology 08/12/14 1035 08/12/14 1520   08/04/14 1200  vancomycin (VANCOCIN) 50 mg/mL oral solution 125 mg  Status:  Discontinued     125 mg Oral 4 times per day 08/04/14 0851 08/08/14 1229   07/18/14 1800  vancomycin (VANCOCIN) 50 mg/mL oral solution 125 mg  Status:  Discontinued     125 mg Oral 4 times per day 07/18/14 1455 08/04/14 0845   07/17/14 1830  vancomycin (VANCOCIN) IVPB 750 mg/150 ml premix     750 mg 150 mL/hr over 60 Minutes Intravenous  Once 07/17/14 1818 07/18/14 0024   07/15/14 1400  metroNIDAZOLE (FLAGYL) tablet 500 mg  Status:  Discontinued     500 mg Oral 3 times per day 07/15/14 1202 07/18/14 1533   07/13/14 2200  piperacillin-tazobactam (ZOSYN) IVPB 2.25 g  Status:  Discontinued     2.25 g 100 mL/hr over 30 Minutes Intravenous 3 times per day 07/13/14 1318 07/24/14 1138   07/13/14 2000  fluconazole (DIFLUCAN) IVPB 200 mg  Status:  Discontinued     200 mg 100 mL/hr over 60 Minutes Intravenous Every 24 hours 07/13/14 1318 07/19/14 1027   07/07/14 1200  vancomycin (VANCOCIN) IVPB 1000 mg/200 mL premix  Status:  Discontinued     1,000 mg 200 mL/hr over 60 Minutes Intravenous Every 24 hours 07/07/14 0937 07/14/14 1712   07/06/14 2000  fluconazole (DIFLUCAN) IVPB 400 mg  Status:  Discontinued     400 mg 100 mL/hr over 120 Minutes Intravenous Every 24 hours 07/06/14 1507 07/13/14 1318   07/06/14 1800  piperacillin-tazobactam (ZOSYN) IVPB 2.25 g  Status:  Discontinued     2.25 g 100  mL/hr over 30 Minutes Intravenous 4 times per day 07/06/14 1505 07/13/14 1318   07/05/14 2000  fluconazole (DIFLUCAN) IVPB 200 mg  Status:  Discontinued     200 mg 100 mL/hr over 60 Minutes Intravenous Every 24 hours 07/05/14 0757 07/06/14 1507   07/05/14 1400  piperacillin-tazobactam (ZOSYN) IVPB 2.25 g  Status:  Discontinued     2.25 g 100 mL/hr over 30 Minutes Intravenous 3 times per day 07/05/14 0749 07/06/14 1505   07/02/14 1800  vancomycin (VANCOCIN) IVPB 1000 mg/200 mL premix  Status:  Discontinued     1,000 mg 200 mL/hr over 60 Minutes Intravenous Every 24 hours 07/01/14 1858 07/05/14 0801   07/01/14 2200  piperacillin-tazobactam (ZOSYN) IVPB 3.375 g  Status:  Discontinued     3.375 g 100 mL/hr over 30 Minutes Intravenous 4 times per day 07/01/14 1858 07/05/14 0749   07/01/14 2000  fluconazole (DIFLUCAN) IVPB 400 mg  Status:  Discontinued     400 mg 100 mL/hr over 120 Minutes Intravenous Every 24 hours 07/01/14 1858 07/05/14 0757   07/01/14 1400  fluconazole (DIFLUCAN) IVPB 200 mg  Status:  Discontinued  200 mg 100 mL/hr over 60 Minutes Intravenous Every 24 hours 07/01/14 1330 07/01/14 1853   07/01/14 1000  piperacillin-tazobactam (ZOSYN) IVPB 2.25 g  Status:  Discontinued     2.25 g 100 mL/hr over 30 Minutes Intravenous Every 8 hours 07/01/14 0952 07/01/14 1853   07/01/14 1000  vancomycin (VANCOCIN) 500 mg in sodium chloride 0.9 % 100 mL IVPB     500 mg 100 mL/hr over 60 Minutes Intravenous  Once 07/01/14 0952 07/01/14 1126   06/30/14 1200  vancomycin (VANCOCIN) IVPB 1000 mg/200 mL premix     1,000 mg 200 mL/hr over 60 Minutes Intravenous  Once 06/30/14 0944 06/30/14 1403   06/30/14 1200  ceFEPIme (MAXIPIME) 2 g in dextrose 5 % 50 mL IVPB     2 g 100 mL/hr over 30 Minutes Intravenous  Once 06/30/14 0944 06/30/14 1425   06/30/14 0100  vancomycin (VANCOCIN) 2,000 mg in sodium chloride 0.9 % 500 mL IVPB     2,000 mg 250 mL/hr over 120 Minutes Intravenous  Once 06/30/14 0048  06/30/14 0525   06/30/14 0100  ceFEPIme (MAXIPIME) 2 g in dextrose 5 % 50 mL IVPB     2 g 100 mL/hr over 30 Minutes Intravenous  Once 06/30/14 0048 06/30/14 0322   06/27/14 0600  cefUROXime (ZINACEF) 1.5 g in dextrose 5 % 50 mL IVPB     1.5 g 100 mL/hr over 30 Minutes Intravenous On call to O.R. 06/26/14 1019 06/27/14 0630   06/19/14 1800  oseltamivir (TAMIFLU) capsule 30 mg     30 mg Oral Every M-W-F (1800) 06/19/14 1003 06/22/14 1841   06/18/14 1600  oseltamivir (TAMIFLU) capsule 30 mg  Status:  Discontinued     30 mg Oral Daily 06/18/14 1538 06/19/14 1003       Assessment/Plan: POD #57 s/p Exploratory Laparotomy with graham patch closure of perforated pyloric ulcer, SBR for ischemic bowel - 07/01/2014 - Dalbert Batman  Intra-abdominal (pelvic) fluid collection-decreased on CT 5/20, pulled out drain Duodenal fistula -fistula controlled by red rubber -G tube to gravity -apply abdominal binder to prevent pt from pulling at tubed -NPO -BID wet to dry dressing changes ID-Ceftaz D#10/21, flagyl 1 week post per ID for C diff and pseudomonas intra-abdominal abscess VTE prophylaxis-SCD, HIT+ ESRD FEN-NPO, increase TF to 65ml/hr.  Goal is 65ml/hr per RD +1418ml free water, but that seems to be high.  Will clarify  Erby Pian, [facility] Surgery Pager [phone]) For consults and floor pages call [phone]([id number])  08/27/2014 8:02 AM

## 2014-08-27 NOTE — Progress Notes (Addendum)
PROGRESS NOTE  [name] AVW:[id number] DOB: 1955-08-21 DOA: 06/17/2014 PCP: [name], MD  Brief Narrative: 59 yo ? smoker with hx HTN, COPD, CKD IV followed @ [facility]-[name] Page, failed L AV fistula, initially admitted 3/16 with flu and acute hypoxic resp failure. Ultimately tx to [facility] due to need for HD. L sided weakness 3/18 and found to have R ACA stroke-showed acute infarct R ACA (3/19). Patient more lethargic 3/30 and CT abd=perf viscus 3/30. She was taken to the OR for ex lap for perforated pyloric ulcer with diffuse peritonitis, ischemic necrosis of mid ileum. Patient developed septic shock on 4-1, she was started on pressors and wean off pressors on 4-3. Subsequently, she was diagnosed with pelvic abscess by CT scan perform on 4-7. She underwent pelvic drain placement by IR on 4/8= culture neg 08/15/14 another drained placed in the pelvic cul-de-sac abscess with culture = pseudomonas.  On 08/20/14, Gluteal drain was inadvertently pulled out. Repeat CT scan shows improvement in the fluid collection so decision was to leave it out and continue IV abx. Pt hospital stay also complicated by high output duodenal (enterocutaneous fistula) fistula.  As a result, pt was made npo and started on TPN.  Now stable, on TNA + very high output from fistula/leak per [facility]. Had a G_J tube placed by Dr. [name] of IR and Gen surgery has continued to assist with management of her multiple surgical comorbidities C. diff diagnosed 4-12-initially unresponsive to flagyl, and she was started on PO vancomycin 4-16.  06/24/14 Develops run of SVT, atrial tachycardia. Cardiology was helping with management. Course complicated with high RV pressure and Cor Pulmonale; 4/27 CT angio negative for PE.  On 5/2 she developed hypoxemia, hypotension and SVT during dialysis treatment. Dialysis was stopped. She received IV bolus. She was started on BIPAP. She was transferred to Step down unit. PCCM and cardiology re  consulted. Her Hr decreased, BP improved. She was taken off BIPAP. This recurred on 5/15 with episodic SVT. Since then she has had intermitten short episodes of SVT, but now main rhythm is sinus tach.  Assessment/Plan: Status post exploratory laparotomy with closure of perforated pyloric ulcer/abdominal abscess / small bowel resection w/ postop ileus and now with enterocutaneous fistula. Pelvic abscess s/p percutaneous drain placement 4/8; had leakage from anastomosis site. Received Zosyn for 23 days. Chronic low grade leukocytosis range 13-15 -IV antibiotics were discontinued by Surgery on 4/22. Also received 18 days of Vancomycin and fluconazole.  -Due to Fistula is back to NPO state, TPN was started -Fistula study done shows JP drain connection to the duodenal bulb-5-02. - 08/15/2014 repeat CT abdomen with 3.0 x 3.5 x 5.7 cm collection in the pelvic cul-de-sac and extraluminal air alongside JP drain in anterior abd wall -Drain with old and new blood, Hb trending down, transfuse 2 unit PRBC 5/10 -continues to have high output from Fistula/leak. Had G_J placed by IR 08/12/14 at request of gen surgery -CT repeat shows 5 cm post cul-de-sac abscess which was drained on 5/16-culture revealed pseudomonas aeruginosa -ID was consulted as patient had Pseudomonas growing from her culture wounds and they started back Flagyl as well as Elita Quick on 5/16 and are following and we await final growth from her new abscess drained on 5/16. -Per ID recommendation--plan D#10/21 daysFortaz and D#10/ 28 days metronidazole -Patient inadvertently pulled out her gluteal drain May 19. Seen by interventional radiology and they recommended repeat CT scan, which was  done. Shows that the fluid collection has decreased in size. They do not plan to replace the drain at this time. Continue with antibiotics alone.  -08/21/2014 CT scan did suggest a transmural defect in the stomach wall near the patient's pyloric Cheree Ditto patch. We will  let surgery address this. -Patient accidentally pulled out her JP drain on 5/22. This has been replaced by surgery on 5/23. -Octreotide was discontinued 5/23--enterocutaneous fistula output appears to be decreasing with 650 mL in the past 24 hours - Continue with tube feedings. Seems to be tolerating. No significant residuals  -TPN rate had to be reduced due to fluid overload--now at 40cc/hr   -Enteral Feeding was increased 5/26 to 45 mL per hour.  PSVT  -Felt to be secondary to severe anxiety with dialysis as well as multiple co-morbidities  -Has been evaluated by EP.  -Cardiology following peripherally- follow K+ and Mg  -Continue IV metoprolol, now off amiodarone but has been limited by soft bp -V-Q scan with intermediate probability for pulmonary embolism.  -CTA 4/27, negative for PE  -Heart rate better controlled with scheduled intravenous metoprolol but has been limited by soft BP -Chest pain on 5/22, likely related to the fact that JP drain was accidentally pulled out, which resulted in severe abdominal pain. Chest x-ray did not show any concerning findings. EKG was nonischemic. Troponins are unremarkable. Don't anticipate further workup unless she has recurrence of her symptoms. -Patient continues to have rare intermittent paroxysmal episodes of SVT that are asymptomatic. -Unfortunately, the patient's soft blood pressures have limited use of intravenous metoprolol ESRD new, now on HD this admit complicated by intermittent hypotension AKI on CKD 4, due to sepsis, shock Ongoing hemodialysis per Nephrology - perm-cath placed 3/26 d/t AVF problems Patient AV fistula reviewed and not mature yet per Dr. Paulene Floor need to revisit prior to d/c -TPN presently at 40cc/hr -Continue hemodialysis Tuesday, Thursday, Saturday while the patient is in the hospital  C. difficile colitis still on contact precautions -C diff positive on 4-12, Received 4 days of flagyl without improvement,  then started on PO vanc -Completed 3 weeks of Oral Vanc-stopped 5/7, this was 1 week after IV Zosyn was stopped 5/5 -Diarrhea resolved--stools are firm  -keep on contact precautions  ACA CVA with Lt sided weakness -TEE ;normal LV function; no LAA thrombus -Started on ASA 81mg  per Neuro -Will need ongoing long term rehab - PT/OT to cont to follow while inpatient -Patient remains very weak and deconditioned. 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 Fluids/volume managed with hemodialysis.  Elevated PA pressures on 2-D echo; CTA negative for PE - 07/23/2014 echo-- EF 60-61%, grade 1 diastolic dysfunction,  Severe RV dilatation, PAP 90 Small PFO Has been evaluated by Cardiology. No intervention planned currently.  Anemia of critical illness/chronic disease -and bleeding from leak/fistula which has resolved. -s/p 2U PRBC 4/13 and 5/10 and 5/26 Aranesp/iron per renal -hb stable--transfuse for hemoglobin less than 7 Acute hypoxic Respiratory Failure:  -This was secondary to H1N1, hypervolemia, HCAP >> resolved.  -also has COPD and Cor Pulmonale - patient complained of some increased shortness of breath today-- wheezing on exam - start scheduled Xopenex and Atrovent - repeat CXR Thrombocytopenia; resolved.  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 On TPN now, CBGs stabilizedin 120-160s. Hba1c  6.5 -08/27/14--question accuracy of CBG of 457 on BMP this am--likely contaminated with TPN  Abnormal TSH -Free T4 WNL, T3 1.8 -most likely sick thyroid with ongoing infection and body stress; also due to amiodarone use. TSH in 7 range -continue low dose synthroid given PSVT, now on IV synthroid   L Renal mass  -noted on [place], Indeterminate 1.9 cm hypoechoic mass off the interpolar region of the left kidney -may potentially represent a cyst however solid mass is not excluded -needs outpatient FU for this  Obesity Inadequate oral intake related to altered GI function as evidenced by limited intake, ongoing.  Body mass index is 35.03 kg/(m^2).  Fall on May 18 Examination did not reveal any injuries.   DVT prophylaxis: SCDs Code Status: FULL Family Communication: Discussed with patient. No family at bedside. Disposition Plan: Have been unable to transfer patient out of stepdown unit due to dyspnea and tachycardia. She also has complex wound care needs. Dialysis is ongoing. Not ready for discharge.  Consultants: Cardiology Nephrology PCCM Gen Surgery  Palliative care       Procedures/Studies: Ct Abdomen Pelvis Wo Contrast  08/22/2014   CLINICAL DATA:  Abdominal abscess. No intravenous contrast due to renal failure.  EXAM: CT ABDOMEN AND PELVIS WITHOUT CONTRAST  TECHNIQUE: Multidetector CT imaging of the abdomen and pelvis was performed following the standard protocol without IV contrast.  COMPARISON:  08/15/2014  FINDINGS: BODY WALL: Open laparotomy without fluid collection.  LOWER CHEST: Chronic triangular opacity in the posterior costophrenic sulcus on the left, atelectasis versus scarring  ABDOMEN/PELVIS:  Liver: No focal abnormality.  Biliary: High-density material within the gallbladder showing mild wall thickening which is chronic.  Pancreas: Unremarkable.  Spleen: Unremarkable.  Adrenals: Unremarkable.  Kidneys and ureters: No hydronephrosis or stone. Presumed 1 cm cyst  from the interpolar left kidney  Bladder: Decompressed.  No pathologic findings.  Reproductive: Calcified and noncalcified uterine fibroids, better seen on previous enhanced imaging.  Bowel: Right upper quadrant drain related to perforated pyloric ulcer status post Cheree Ditto patch. Specially visible on coronal imaging, there is a gas channel through the pylorus measuring 6 to 9 mm in diameter. No bowel obstruction. Enteroenterostomy noted in the pelvis. Oral contrast again seen in the colon. No notable colonic wall thickening in this patient with history of C difficile. Negative appendix. Percutaneous gastrojejunostomy tube is in good position.  Retroperitoneum: No mass or adenopathy.  Peritoneum: Known thick walled collection in the rectovaginal recess smaller than 08/15/2014 at 40 x 16 mm as compared to 51 x 32 mm. No new collection is identified. No free pneumoperitoneum.  Vascular: 3 cm fusiform infrarenal aortic aneurysm.  OSSEOUS: No acute abnormalities.  IMPRESSION: 1. Near the patient's pyloric Cheree Ditto patch is a transmural defect in the stomach wall, contiguous with the surgical drain. If repeat surgery is considered, oral contrast could confirm an open ulcer. 2. Decreased rectovaginal recess fluid collection, now 4 x 1.5 cm (previously 5 x 3 cm).   Electronically Signed   By: Marnee Spring M.D.   On: 08/22/2014 01:49   Ct Abdomen Pelvis Wo Contrast  08/15/2014   CLINICAL DATA:  Intra abdominal abscess.  EXAM: CT ABDOMEN AND PELVIS WITHOUT  CONTRAST  TECHNIQUE: Multidetector CT imaging of the abdomen and pelvis was performed following the standard protocol without IV contrast.  COMPARISON:  CT scan of Aug 03, 2014.  FINDINGS: Severe degenerative disc disease is noted at L5-S1. Minimal subsegmental atelectasis is noted posteriorly in the left lung base.  No gallstones are noted. No focal abnormality is noted in the liver, spleen or pancreas on these unenhanced images. Adrenal glands appear normal. Stable  exophytic cyst is seen arising from midpole of left kidney. No hydronephrosis or renal obstruction is noted. No renal or ureteral calculi are noted. 3 cm infrarenal abdominal aortic aneurysm is noted. Gastrojejunostomy tube is seen entering stomach percutaneously and with distal tip in the jejunum. JP surgical drain is seen entering right upper quadrant of abdomen underneath the liver with distal tip in the left upper quadrant. No significant fluid collection is noted around the drain. Fluid collection is again identified and posterior cul-de-sac of pelvis measuring 5.1 x 3.2 cm. Calcified degenerating fibroid is noted in the uterus. Urinary bladder is unremarkable. There is no evidence of bowel obstruction. The appendix appears normal. No significant adenopathy is noted. Large midline surgical anterior abdominal wall incision is noted.  IMPRESSION: Stable 3 cm infrarenal abdominal aortic aneurysm.  No change in position of JP surgical drain with distal tip in the left upper quadrant of the abdomen.  Interval placement of percutaneous gastrojejunostomy tube with distal tip in the jejunum.  Grossly stable size and appearance of 5 cm fluid collection seen in posterior cul-de-sac of the pelvis.  Continued presence uterine fibroids.   Electronically Signed   By: Lupita Raider, M.D.   On: 08/15/2014 15:15   Dg Chest 1 View  08/13/2014   CLINICAL DATA:  Shortness of breath  EXAM: CHEST  1 VIEW  COMPARISON:  08/03/2014  FINDINGS: Moderately severe cardiac enlargement. Central line in unchanged position on the right and left side. Mild vascular congestion with no evidence of edema effusion or consolidation.  IMPRESSION: No acute findings   Electronically Signed   By: Esperanza Heir M.D.   On: 08/13/2014 11:04   Dg Abd 1 View  08/03/2014   CLINICAL DATA:  NG tube placement  EXAM: ABDOMEN - 1 VIEW  COMPARISON:  07/13/2014  FINDINGS: NG tube tip is in the proximal stomach. The side port is near the GE junction.   IMPRESSION: NG tube tip in the proximal stomach.   Electronically Signed   By: Charlett Nose M.D.   On: 08/03/2014 17:18   Ct Angio Chest Pe W/cm &/or Wo Cm  07/29/2014   CLINICAL DATA:  Shortness of breath. Patients brief history is as follows: "smoker with hx HTN, ESRD on HD (just began this admit), COPD, failed L AV fistula, initially admitted 3/16 with flu and acute hypoxic resp failure. Ultimately tx to [facility] due to need for HD. 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 Patient develops septic shock on 4-1, she was started on pressors and wean off pressors on 4-3. Subsequently she was diagnosed with pelvic abscess by CT scan perform on 4-7. She underwent pelvic drain placement by IR. Patient also develops C. diff diagnosed 4-12. C diff was not responding flagyl, and she was started on vancomycin 4-16. She also develops run of SVT. Cardiology has been helping with management. Course complicated with high RV  pressure and concerns for PE; also with fistula formation between anastomosis and blake drain, making her back to NPO state and in need of TPN.  EXAM: CT ANGIOGRAPHY CHEST WITH CONTRAST  TECHNIQUE: Multidetector CT imaging of the chest was performed using the standard protocol during bolus administration of intravenous contrast. Multiplanar CT image reconstructions and MIPs were obtained to evaluate the vascular anatomy.  CONTRAST:  80mL OMNIPAQUE IOHEXOL 350 MG/ML SOLN  COMPARISON:  Chest radiograph, 07/14/2014  FINDINGS: Angiographic study: No evidence of a pulmonary embolus. Aorta is normal in caliber. No dissection.  Thoracic inlet:  No masses or adenopathy.  Mediastinum and hila: Heart is mildly enlarged due to right heart enlargement. No mediastinal or hilar masses or pathologically enlarged lymph nodes.  Lungs and pleura: No lung consolidation or edema. Mild emphysema. There are minor  areas of peripheral reticular scarring and minor dependent subsegmental atelectasis. No pleural effusion or pneumothorax.  Limited upper abdomen: Partly image catheter lies above the proximal stomach. No acute findings in the upper abdomen.  Musculoskeletal:  No osteoblastic or osteolytic lesions.  Review of the MIP images confirms the above findings.  IMPRESSION: 1. No evidence of a pulmonary embolus. 2. No acute findings. 3. Cardiomegaly. 4. Mild emphysema.  No evidence of pneumonia or pulmonary edema.   Electronically Signed   By: Amie Portland M.D.   On: 07/29/2014 15:53   Ct Abdomen Pelvis W Contrast  08/04/2014   CLINICAL DATA:  New epigastric abdominal pain.  EXAM: CT ABDOMEN AND PELVIS WITH CONTRAST  TECHNIQUE: Multidetector CT imaging of the abdomen and pelvis was performed using the standard protocol following bolus administration of intravenous contrast.  CONTRAST:  OMNIPAQUE IOHEXOL 300 MG/ML  SOLN  COMPARISON:  07/16/2014  FINDINGS: There is a small volume extraluminal air alongside the JP drain in the anterior abdominal midline. Source of the air is not clear. No other extraluminal air is evident in the abdomen or pelvis. No undrained fluid collection is evident alongside the JP drain. There is a 3.5 x 5.7 x 3.0 cm contained collection in the pelvic cul-de-sac which could represent a small abscess.  There is an open midline abdominal wound without associated drainable collection. There is herniation of abdominal fat through the abdominal wall musculature at the location of the wound.  There are normal appearances of the liver, spleen, pancreas, adrenals and kidneys with the exception of a 1.8 cm simple left renal cyst. There is an unchanged 3 cm infrarenal abdominal aortic aneurysm. The duodenum is draped over the aneurysm but appears intact.  There are multiple uterine fibroids.  There is no significant abnormality in the lower chest.  IMPRESSION: *Extraluminal air alongside the JP drain in  the anterior abdominal midline, etiology not apparent on this scan *3.0 x 3.5 x 5.7 cm collection in the pelvic cul-de-sac *Unchanged 3 cm infrarenal abdominal aortic aneurysm directly adjacent to the duodenum *Open wound in the abdominal midline. No fluid collection near the wound.   Electronically Signed   By: Ellery Plunk M.D.   On: 08/04/2014 02:24   Ir Gastrostomy Tube Mod Sed  08/12/2014   CLINICAL DATA:  Perforated duodenal ulcer, post g patch with persistent leak. Percutaneous gastrojejunostomy as requested for gastric decompression and small bowel enteral feeding support.  EXAM: PERC PLACEMENT GASTROSTOMY;  CONVERT G-TUBE TO G-JTUBE  FLUOROSCOPY TIME:  16 minutes 24 seconds, 1016.6 mGy  TECHNIQUE: The procedure, risks, benefits, and alternatives were explained to the patient. Questions regarding the procedure were  encouraged and answered. The patient understands and consents to the procedure. As antibiotic prophylaxis, cefazolin 2 g was ordered pre-procedure and administered intravenously within one hour of incision. . A 5 French angiographic catheter was placed as orogastric tube. The upper abdomen was prepped with Betadine, draped in usual sterile fashion, and infiltrated locally with 1% lidocaine. 1 mg glucagon was administered intravenously to facilitate gastric distention and slow peristalsis. Stomach was insufflated using air through the orogastric tube. An 58 French sheath needle was advanced percutaneously into the gastric lumen under fluoroscopy. Gas could be aspirated and a small contrast injection confirmed intraluminal spread. The sheath was exchanged over a guidewire for a 9 [place] vascular sheath, through which the snare device was advanced and used to snare a guidewire passed through the orogastric tube. This was withdrawn, and the snare attached to the 20 French pull-through gastrostomy tube, which was advanced antegrade, positioned with the internal bumper securing the anterior  gastric wall to the anterior abdominal wall. Small contrast injection confirms appropriate positioning. The external bumper was applied and the catheter was flushed.  A 5 French angiographic catheter was placed coaxially through the gastrostomy tube and used to direct an angled stiff glidewire across the pylorus, beyond the proximal duodenal perforation, and into the proximal jejunum just beyond the ligament of Treitz. Over this, a coaxial jejunostomy feeding adapter was advanced. Contrast injection confirmed patency and appropriate position of the gastric and jejunal lumens. No extravasation. The lumens were flushed with saline and capped. The patient tolerated the procedure well.  COMPLICATIONS: COMPLICATIONS none  IMPRESSION: 1. Technically successful 20 French pull-through gastrostomy placement under fluoroscopy. 2. Technically successful coaxial jejunostomy feeding adapter placement to the ligament of Treitz.   Electronically Signed   By: Corlis Leak M.D.   On: 08/12/2014 16:12   Ir Fluoro Guide Cv Line Left  07/29/2014   CLINICAL DATA:  End-stage renal disease, access for TPN  EXAM: LEFT IJ TUNNELED DOUBLE-LUMEN POWER PICC LINE PLACEMENT WITH ULTRASOUND AND FLUOROSCOPIC GUIDANCE  FLUOROSCOPY TIME:  3 minutes 24 seconds  PROCEDURE: The patient was advised of the possible risks andcomplications and agreed to undergo the procedure. The patient was then brought to the angiographic suite for the procedure.  The LEFT NECKwas prepped with chlorhexidine, drapedin the usual sterile fashion using maximum barrier technique (cap and mask, sterile gown, sterile gloves, large sterile sheet, hand hygiene and cutaneous antisepsis) and infiltrated locally with 1% Lidocaine.  Ultrasound demonstrated patency of the left internal jugular vein, and this was documented with an image. Under real-time ultrasound guidance, this vein was accessed with a 21 gauge micropuncture needle and image documentation was performed. A 0.018 wire  was introduced in to the vein. Under sterile conditions and local anesthesia, a subcutaneous tunnel was created in the left infraclavicular chest. PICC line was tunneled subcutaneously to the venotomy site. Through a peel-away sheath, a cuffed 5 [place] double lumen power PICC was advanced to the lower SVC/right atrial junction. Fluoroscopy during the procedure and fluoro spot radiograph confirms appropriate catheter position. The catheter was flushed and covered with asterile dressing.  Catheter length: 26  Complications: None immediate  IMPRESSION: Successful left internal jugulartunneled double-lumen Power PICC line placement with ultrasound and fluoroscopic guidance. The catheter is ready for use.   Electronically Signed   By: Judie Petit.  Shick M.D.   On: 07/29/2014 13:03   Ir Adele Schilder Tamsen Snider Convert Gastr-jej Per W/fl Mod Sed  08/12/2014   CLINICAL DATA:  Perforated duodenal ulcer, post g  patch with persistent leak. Percutaneous gastrojejunostomy as requested for gastric decompression and small bowel enteral feeding support.  EXAM: PERC PLACEMENT GASTROSTOMY;  CONVERT G-TUBE TO G-JTUBE  FLUOROSCOPY TIME:  16 minutes 24 seconds, 1016.6 mGy  TECHNIQUE: The procedure, risks, benefits, and alternatives were explained to the patient. Questions regarding the procedure were encouraged and answered. The patient understands and consents to the procedure. As antibiotic prophylaxis, cefazolin 2 g was ordered pre-procedure and administered intravenously within one hour of incision. . A 5 French angiographic catheter was placed as orogastric tube. The upper abdomen was prepped with Betadine, draped in usual sterile fashion, and infiltrated locally with 1% lidocaine. 1 mg glucagon was administered intravenously to facilitate gastric distention and slow peristalsis. Stomach was insufflated using air through the orogastric tube. An 37 French sheath needle was advanced percutaneously into the gastric lumen under fluoroscopy. Gas could be  aspirated and a small contrast injection confirmed intraluminal spread. The sheath was exchanged over a guidewire for a 9 [place] vascular sheath, through which the snare device was advanced and used to snare a guidewire passed through the orogastric tube. This was withdrawn, and the snare attached to the 20 French pull-through gastrostomy tube, which was advanced antegrade, positioned with the internal bumper securing the anterior gastric wall to the anterior abdominal wall. Small contrast injection confirms appropriate positioning. The external bumper was applied and the catheter was flushed.  A 5 French angiographic catheter was placed coaxially through the gastrostomy tube and used to direct an angled stiff glidewire across the pylorus, beyond the proximal duodenal perforation, and into the proximal jejunum just beyond the ligament of Treitz. Over this, a coaxial jejunostomy feeding adapter was advanced. Contrast injection confirmed patency and appropriate position of the gastric and jejunal lumens. No extravasation. The lumens were flushed with saline and capped. The patient tolerated the procedure well.  COMPLICATIONS: COMPLICATIONS none  IMPRESSION: 1. Technically successful 20 French pull-through gastrostomy placement under fluoroscopy. 2. Technically successful coaxial jejunostomy feeding adapter placement to the ligament of Treitz.   Electronically Signed   By: Corlis Leak M.D.   On: 08/12/2014 16:12   Ir US Guide Vasc Access Left  07/29/2014   CLINICAL DATA:  End-stage renal disease, access for TPN  EXAM: LEFT IJ TUNNELED DOUBLE-LUMEN POWER PICC LINE PLACEMENT WITH ULTRASOUND AND FLUOROSCOPIC GUIDANCE  FLUOROSCOPY TIME:  3 minutes 24 seconds  PROCEDURE: The patient was advised of the possible risks andcomplications and agreed to undergo the procedure. The patient was then brought to the angiographic suite for the procedure.  The LEFT NECKwas prepped with chlorhexidine, drapedin the usual sterile fashion  using maximum barrier technique (cap and mask, sterile gown, sterile gloves, large sterile sheet, hand hygiene and cutaneous antisepsis) and infiltrated locally with 1% Lidocaine.  Ultrasound demonstrated patency of the left internal jugular vein, and this was documented with an image. Under real-time ultrasound guidance, this vein was accessed with a 21 gauge micropuncture needle and image documentation was performed. A 0.018 wire was introduced in to the vein. Under sterile conditions and local anesthesia, a subcutaneous tunnel was created in the left infraclavicular chest. PICC line was tunneled subcutaneously to the venotomy site. Through a peel-away sheath, a cuffed 5 [place] double lumen power PICC was advanced to the lower SVC/right atrial junction. Fluoroscopy during the procedure and fluoro spot radiograph confirms appropriate catheter position. The catheter was flushed and covered with asterile dressing.  Catheter length: 26  Complications: None immediate  IMPRESSION: Successful left internal jugulartunneled  double-lumen Power PICC line placement with ultrasound and fluoroscopic guidance. The catheter is ready for use.   Electronically Signed   By: Judie PetitM.  Shick M.D.   On: 07/29/2014 13:03   Dg Chest Port 1 View  08/23/2014   CLINICAL DATA:  Left chest pain  EXAM: PORTABLE CHEST - 1 VIEW  COMPARISON:  08/18/2014  FINDINGS: Patient is rotated.  Lungs are essentially clear. No focal consolidation. No pleural effusion or pneumothorax.  Cardiomegaly.  Left IJ venous catheter terminates at the cavoatrial junction. Right IJ dual lumen dialysis catheter terminates in the lower SVC.  IMPRESSION: No evidence of acute cardiopulmonary disease.   Electronically Signed   By: Charline BillsSriyesh  Krishnan M.D.   On: 08/23/2014 19:17   Dg Chest Port 1 View  08/18/2014   CLINICAL DATA:  Shortness of breath at rest.  Having hemodialysis.  EXAM: PORTABLE CHEST - 1 VIEW  COMPARISON:  08/15/2014.  FINDINGS: The cardiac silhouette  remains mildly enlarged. Stable right jugular double-lumen catheter. Clear lungs. The pulmonary vasculature remains prominent. No pleural fluid. Minimal right shoulder degenerative changes.  IMPRESSION: Stable cardiomegaly and pulmonary vascular congestion.   Electronically Signed   By: Beckie SaltsSteven  Reid M.D.   On: 08/18/2014 09:47   Dg Chest Port 1 View  08/15/2014   CLINICAL DATA:  Shortness of breath.  EXAM: PORTABLE CHEST - 1 VIEW  COMPARISON:  08/13/2014 and multiple prior chest radiographs  FINDINGS: Cardiomegaly and pulmonary vascular congestion again noted.  A right IJ central venous catheter is noted with tips overlying the upper and mid SVC.  A left central venous catheter is present with tip overlying the lower SVC.  There is no evidence of focal airspace disease, pulmonary edema, suspicious pulmonary nodule/mass, pleural effusion, or pneumothorax. No acute bony abnormalities are identified.  IMPRESSION: Cardiomegaly with pulmonary vascular congestion.   Electronically Signed   By: Harmon PierJeffrey  Hu M.D.   On: 08/15/2014 17:28   Dg Chest Port 1v Same Day  08/03/2014   CLINICAL DATA:  59 year old female dialysis patient with severe shortness of breath. Respiratory distress. Initial encounter.  EXAM: PORTABLE CHEST - 1 VIEW SAME DAY  COMPARISON:  Chest CTA 07/29/2014 and earlier.  FINDINGS: Portable AP semi upright view at 1400 hrs. Bilateral IJ approach chest catheters appears stable. Stable lung volumes. Stable cardiomegaly and mediastinal contours. No pneumothorax, pulmonary edema, pleural effusion or consolidation. No acute pulmonary opacity identified.  IMPRESSION: No acute cardiopulmonary abnormality.   Electronically Signed   By: Odessa FlemingH  Hall M.D.   On: 08/03/2014 14:18   Dg Abd Portable 1v  08/23/2014   CLINICAL DATA:  JP drain, broken, initial encounter  EXAM: PORTABLE ABDOMEN - 1 VIEW  COMPARISON:  CT abdomen pelvis dated 08/21/2014  FINDINGS: A portion of the right flank is excluded from this single  image.  The patient's right abdominal surgical drain is not visualized.  Gastrojejunostomy in satisfactory position.  IMPRESSION: The patient's right abdominal surgical drain is not visualized.  Gastrojejunostomy in satisfactory position.   Electronically Signed   By: Charline BillsSriyesh  Krishnan M.D.   On: 08/23/2014 19:11   Ct Image Guided Fluid Drain By Catheter  08/17/2014   CLINICAL DATA:  Postoperative intra-abdominal abscess, subsequent encounter. Small pelvic fluid collection.  EXAM: CT-GUIDED DRAIN PLACEMENT IN PELVIC FLUID COLLECTION  Physician: Rachelle HoraAdam R. Lowella DandyHenn, MD  FLUOROSCOPY TIME:  None  MEDICATIONS: 50 mcg fentanyl  ANESTHESIA/SEDATION: Patient was monitored by a radiology nurse during the procedure.  PROCEDURE: Informed consent was obtained for  drain placement. Patient was placed on her left side. Images through the pelvis were obtained. The right buttock was prepped and draped in sterile fashion. 1% lidocaine was used for a local anesthetic. An 18 gauge needle was directed into the pelvic fluid collection with CT guidance from a transgluteal approach. Thick yellow purulent fluid was aspirated. A stiff Amplatz wire was placed. The tract was dilated to accommodate a 10.2 [place] multipurpose drain. It was technically difficult to advance the drain into the collection due to the small size of the collection and the patient's body habitus. Eventually, the drain was successfully placed within the collection. 5 mL of yellow purulent fluid was obtained. Catheter was sutured to the skin and attached to a suction bulb. Fluid was sent for culture.  FINDINGS: Small fluid collection just posterior to the uterus. A total of 5 mL of yellow purulent fluid was removed.  Estimated blood loss: Minimal  COMPLICATIONS: None  IMPRESSION: CT-guided placement of a drainage catheter in the small pelvic fluid collection.   Electronically Signed   By: Richarda Overlie M.D.   On: 08/17/2014 17:21         Subjective:  patient complains  of some worsening shortness of breath. She does not feel as well today. Denies any nausea, vomiting, worsening abdominal pain. Denies any fevers, chills, chest pain, headache, fevers, chills. She says her abdominal pain is about the same isolate in the left upper quadrant area.  Objective: Filed Vitals:   08/27/14 0800 08/27/14 0900 08/27/14 1030 08/27/14 1045  BP: 94/67 109/66 119/88 107/62  Pulse: 103 109 110 113  Temp:  98.3 F (36.8 C)  98.1 F (36.7 C)  TempSrc:  Oral  Oral  Resp: 20 22 22  32  Height:      Weight:    90.7 kg (199 lb 15.3 oz)  SpO2: 100% 100% 98% 98%    Intake/Output Summary (Last 24 hours) at 08/27/14 1120 Last data filed at 08/27/14 0936  Gross per 24 hour  Intake    695 ml  Output   1075 ml  Net   -380 ml   Weight change: -0.2 kg (-7.1 oz) Exam:   General:  Pt is alert, follows commands appropriately, not in acute distress  HEENT: No icterus, No thrush, /AT  Cardiovascular: RRR, S1/S2, no rubs, no gallops  Respiratory: bilateral wheeze  Abdomen: Soft/+BS, LUQ and epigastric tender wiithout rebound,  non distended, no guarding  Extremities: No edema, No lymphangitis, No petechiae, No rashes, no synovitis  Data Reviewed: Basic Metabolic Panel:  Recent Labs Lab 08/21/14 0253  08/21/14 1526 08/22/14 0530 08/23/14 0415 08/24/14 0520 08/25/14 0525 08/25/14 0747 08/27/14 0539  NA 132*  < > 136  --  137  --  138 139 132*  K 4.2  < > 4.1  --  4.3  --  4.5 4.5 3.5  CL 98*  < > 101  --  99*  --  101 100* 96*  CO2 25  < > 29  --  30  --  28 27 27   GLUCOSE 123*  < > 107*  --  120*  --  83 90 457*  BUN 34*  < > 11  --  14  --  34* 34* 34*  CREATININE 2.98*  < > 1.71*  --  2.43*  --  5.10* 5.05* 4.56*  CALCIUM 7.8*  < > 7.7*  --  8.3*  --  8.3* 8.1* 7.8*  MG 1.9  --   --  2.0 2.0 2.0  --   --  1.7  PHOS 3.6  < > 2.1* 3.3 3.3 5.0*  --  6.1* 4.4  < > = values in this interval not displayed. Liver Function Tests:  Recent Labs Lab  08/21/14 0253 08/21/14 0825 08/21/14 1526 08/25/14 0525 08/25/14 0747 08/27/14 0539  AST 25  --   --  17  --  17  ALT 9*  --   --  9*  --  8*  ALKPHOS 151*  --   --  127*  --  108  BILITOT 0.8  --   --  1.3*  --  1.0  PROT 6.4*  --   --  6.7  --  6.6  ALBUMIN 1.6* 1.6* 2.0* 2.0* 2.1* 1.9*   No results for input(s): LIPASE, AMYLASE in the last 168 hours. No results for input(s): AMMONIA in the last 168 hours. CBC:  Recent Labs Lab 08/23/14 0415 08/24/14 0520 08/25/14 0525 08/26/14 0648 08/27/14 0539  WBC 15.3* 15.7* 13.6* 13.2* 13.9*  NEUTROABS  --  12.5*  --   --   --   HGB 7.8* 7.6* 7.9* 7.1* 6.8*  HCT 26.2* 25.0* 25.6* 24.1* 24.7*  MCV 94.6 96.2 95.9 97.2 103.8*  PLT 307 309 357 327 323   Cardiac Enzymes:  Recent Labs Lab 08/23/14 1944 08/24/14 0221 08/24/14 0630  TROPONINI 0.04* 0.03 0.03   BNP: Invalid input(s): POCBNP CBG:  Recent Labs Lab 08/24/14 0012 08/24/14 0605 08/24/14 1631 08/25/14 0110 08/25/14 1741  GLUCAP 115* 136* 139* 122* 124*    Recent Results (from the past 240 hour(s))  Culture, routine-abscess     Status: None   Collection Time: 08/17/14  2:51 PM  Result Value Ref Range Status   Specimen Description ABSCESS PELVIS  Final   Special Requests NONE  Final   Gram Stain   Final    FEW WBC PRESENT,BOTH PMN AND MONONUCLEAR NO SQUAMOUS EPITHELIAL CELLS SEEN NO ORGANISMS SEEN Performed at [facility]    Culture   Final    NO GROWTH 3 DAYS Performed at [facility]    Report Status 08/21/2014 FINAL  Final     Scheduled Meds: . antiseptic oral rinse  7 mL Mouth Rinse q12n4p  . budesonide (PULMICORT) nebulizer solution  0.25 mg Nebulization BID  . cefTAZidime (FORTAZ)  IV  2 g Intravenous Q T,Th,Sa-HD  . chlorhexidine  15 mL Mouth Rinse BID  . darbepoetin (ARANESP) injection - DIALYSIS  200 mcg Intravenous Q Thu-HD  . feeding supplement (VITAL AF 1.2 CAL)  1,000 mL Per Tube Q24H  . levothyroxine  12.5 mcg  Intravenous Daily  . metoprolol  2.5 mg Intravenous 4 times per day  . metronidazole  500 mg Intravenous 3 times per day  . sodium chloride  10-40 mL Intracatheter Q12H  . [START ON 08/28/2014] vitamin C  1,000 mg Per Tube Daily   Continuous Infusions: . sodium chloride 10 mL/hr at 08/19/14 0950  . TPN (CLINIMIX) Adult without lytes 40 mL/hr at 08/26/14 1810     Minnetta Sandora, DO  [facility] Pager [phone]  If 7PM-7AM, please contact night-coverage [email or web address] Password [email or web address] 08/27/2014, 11:20 AM   LOS: 71 days

## 2014-08-27 NOTE — Progress Notes (Signed)
PT Cancellation Note  Patient Details Name: [name] MRN: [id number] DOB: Aug 12, 1955   Cancelled Treatment:    Reason Eval/Treat Not Completed: Patient at procedure or test/unavailable (pt leaving room for HD and unable to participate. Will attempt next date)   Delorse Lekabor, Ernest Popowski Beth 08/27/2014, 10:33 AM Delaney MeigsMaija Tabor Roshan Roback, PT [phone]

## 2014-08-28 LAB — TYPE AND SCREEN
ABO/RH(D): B POS
Antibody Screen: POSITIVE
DAT, IgG: POSITIVE
Donor AG Type: NEGATIVE
Donor AG Type: NEGATIVE
Unit division: 0
Unit division: 0

## 2014-08-28 LAB — CBC
HCT: 31 % — ABNORMAL LOW (ref 36.0–46.0)
Hemoglobin: 9.7 g/dL — ABNORMAL LOW (ref 12.0–15.0)
MCH: 29.6 pg (ref 26.0–34.0)
MCHC: 31.3 g/dL (ref 30.0–36.0)
MCV: 94.5 fL (ref 78.0–100.0)
Platelets: 336 10*3/uL (ref 150–400)
RBC: 3.28 MIL/uL — ABNORMAL LOW (ref 3.87–5.11)
RDW: 22.4 % — ABNORMAL HIGH (ref 11.5–15.5)
WBC: 15.7 10*3/uL — ABNORMAL HIGH (ref 4.0–10.5)

## 2014-08-28 MED ORDER — ALTEPLASE 2 MG IJ SOLR
2.0000 mg | Freq: Once | INTRAMUSCULAR | Status: AC
  Administered 2014-08-28: 2 mg
  Filled 2014-08-28: qty 2

## 2014-08-28 MED ORDER — LEVALBUTEROL HCL 1.25 MG/0.5ML IN NEBU
1.2500 mg | INHALATION_SOLUTION | Freq: Three times a day (TID) | RESPIRATORY_TRACT | Status: DC
  Administered 2014-08-28 – 2014-09-04 (×23): 1.25 mg via RESPIRATORY_TRACT
  Filled 2014-08-28 (×26): qty 0.5

## 2014-08-28 MED ORDER — HYDROMORPHONE HCL 1 MG/ML IJ SOLN
2.0000 mg | Freq: Once | INTRAMUSCULAR | Status: AC
  Administered 2014-08-28: 2 mg via INTRAMUSCULAR

## 2014-08-28 MED ORDER — DAKINS (1/4 STRENGTH) 0.125 % EX SOLN
Freq: Two times a day (BID) | CUTANEOUS | Status: AC
  Administered 2014-08-29 – 2014-08-30 (×5): via TOPICAL
  Filled 2014-08-28 (×2): qty 473

## 2014-08-28 MED ORDER — IPRATROPIUM BROMIDE 0.02 % IN SOLN
0.5000 mg | Freq: Three times a day (TID) | RESPIRATORY_TRACT | Status: DC
  Administered 2014-08-28 – 2014-09-11 (×42): 0.5 mg via RESPIRATORY_TRACT
  Filled 2014-08-28 (×44): qty 2.5

## 2014-08-28 NOTE — Progress Notes (Addendum)
Nutrition Follow-up  DOCUMENTATION CODES:  Obesity unspecified  INTERVENTION:  Tube feeding   Recommend goal rate of 75 ml/hr with Vital AF 1.2 formula to provide 2160 kcals, 135 gm protein, 1460 ml free water   If more concentrated formula desired, recommend Vital 1.5 at goal rate of 50 ml/hr with Prostat liquid protein 30 ml TID to provide 2100 kcals, 126 gm protein, 917 ml of free water  NUTRITION DIAGNOSIS:  Inadequate oral intake related to inability to eat as evidenced by NPO status, ongoing  GOAL:  Patient will meet greater than or equal to 90% of their needs, progressing  MONITOR:  TF tolerance, Labs, Weight trends, I & O's, Skin  ASSESSMENT: Pt with CKD and hypertension, presented on 3/16 with shortness of breath and generalized weakness x 2 weeks. Pt found to have metabolic acidosis from worsening uremia, H1N1 flu. Pt started on HD. 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  Patient s/p procedure 3/30: EXPLORATORY LAPAROTOMY CLOSURE OF PERFORATED PYLORIC ULCER SMALL BOWEL RESECTION  Patient s/p procedure 5/11: G-J TUBE PLACEMENT -- tip past ligament of Treitz  TPN discontinued 5/26.   Vital AF 1.2 formula currently infusing via J-port at 55 ml/hr providing 1584 kcals, 99 gm protein, 1071 ml of free water.  Height:  Ht Readings from Last 1 Encounters:  08/02/14 5\' 6"  (1.676 m)    Weight:  Wt Readings from Last 1 Encounters:  08/28/14 213 lb 13.5 oz (97 kg)    Ideal Body Weight:  59.1 kg  Wt Readings from Last 10 Encounters:  08/28/14 213 lb 13.5 oz (97 kg)  06/01/14 225 lb (102.059 kg)  01/28/14 230 lb (104.327 kg)  01/21/14 231 lb 1.6 oz (104.826 kg)  10/02/12 216 lb 8 oz (98.204 kg)    BMI:  Body mass index is 34.53 kg/(m^2).  Estimated Nutritional Needs:  Kcal:  2100-2300  Protein:  120-140 gm  Fluid:  per MD  Skin:  Wound (see comment) (stage 2 pressure ulcer to  sacrum)  Diet Order:  Diet NPO time specified  EDUCATION NEEDS:  Education needs no appropriate at this time   Intake/Output Summary (Last 24 hours) at 08/28/14 1113 Last data filed at 08/28/14 0600  Gross per 24 hour  Intake 3537.67 ml  Output   1670 ml  Net 1867.67 ml    Last BM:  5/25  Maureen ChattersKatie Aydan Phoenix, RD, LDN Pager #: [phone] After-Hours Pager #: [phone]

## 2014-08-28 NOTE — Progress Notes (Signed)
PROGRESS NOTE  [name] ZOX:[id number] DOB: Pola 27, 1957 DOA: 06/17/2014 PCP: [name], MD  Brief Narrative: 59 yo ? smoker with hx HTN, COPD, CKD IV followed @ [facility]-[name] Page, failed L AV fistula, initially admitted 3/16 with flu and acute hypoxic resp failure. Ultimately tx to [facility] due to need for HD. L sided weakness 3/18 and found to have R ACA stroke-showed acute infarct R ACA (3/19). Patient more lethargic 3/30 and CT abd=perf viscus 3/30. She was taken to the OR for ex lap for perforated pyloric ulcer with diffuse peritonitis, ischemic necrosis of mid ileum. Patient developed septic shock on 4-1, she was started on pressors and wean off pressors on 4-3. Subsequently, she was diagnosed with pelvic abscess by CT scan perform on 4-7. She underwent pelvic drain placement by IR on 4/8= culture neg 08/15/14 another drained placed in the pelvic cul-de-sac abscess with culture = pseudomonas. On 08/20/14, Gluteal drain was inadvertently pulled out. Repeat CT scan shows improvement in the fluid collection so decision was to leave it out and continue IV abx. Pt hospital stay also complicated by high output duodenal (enterocutaneous fistula) fistula. As a result, pt was made npo and started on TPN. Now stable, on TNA + very high output from fistula/leak per [facility]. Had a G_J tube placed by Dr. [name] of IR and Gen surgery has continued to assist with management of her multiple surgical comorbidities C. diff diagnosed 4-12-initially unresponsive to flagyl, and she was started on PO vancomycin 4-16.  06/24/14 Develops run of SVT, atrial tachycardia. Cardiology was helping with management. Course complicated with high RV pressure and Cor Pulmonale; 4/27 CT angio negative for PE. On 5/2 she developed hypoxemia, hypotension and SVT during dialysis treatment. Dialysis was stopped. She received IV bolus. She was started on BIPAP. She was transferred to Step down unit. PCCM and cardiology re  consulted. Her Hr decreased, BP improved. She was taken off BIPAP. This recurred on 5/15 with episodic SVT. Since then she has had intermitten short episodes of SVT, but now main rhythm is sinus tach.  Assessment/Plan: Status post exploratory laparotomy with closure of perforated pyloric ulcer/abdominal abscess / small bowel resection w/ postop ileus and now with enterocutaneous fistula. Pelvic abscess s/p percutaneous drain placement 4/8; had leakage from anastomosis site. Received Zosyn for 23 days. Chronic low grade leukocytosis range 13-15 -IV antibiotics were discontinued by Surgery on 4/22. Also received 18 days of Vancomycin and fluconazole.  -Due to Fistula, pt is back to NPO state, TPN was started -Fistula study done shows JP drain connection to the duodenal bulb-5-02. - 08/15/2014 repeat CT abdomen with 3.0 x 3.5 x 5.7 cm collection in the pelvic cul-de-sac and extraluminal air alongside JP drain in anterior abd wall -Drain with old and new blood, Hb trending down, transfuse 2 unit PRBC 5/10 -continues to have high output from Fistula/leak. Had G_J placed by IR 08/12/14 at request of gen surgery -CT repeat shows 5 cm post cul-de-sac abscess which was drained on 5/16-culture revealed pseudomonas aeruginosa -ID was consulted as patient had Pseudomonas growing from her culture wounds and they started back Flagyl as well as Elita Quick on 5/16 and are following and we await final growth from her new abscess drained on 5/16. -Per ID recommendation--plan D#11/21 daysFortaz and D#11/ 28 days metronidazole -Patient inadvertently pulled out her gluteal/cul-de-sac drain May 19. Seen by interventional radiology and they recommended repeat CT scan, which was done. Shows that  the fluid collection has decreased in size. They do not plan to replace the drain at this time. Continue with antibiotics alone.  -08/21/2014 CT scan did suggest a transmural defect in the stomach wall near the patient's pyloric Cheree Ditto  patch. We will let surgery address this. -Patient accidentally pulled out her JP drain on 5/22. This has been replaced by surgery on 5/23. -Octreotide was discontinued 5/23--enterocutaneous fistula output appears to be decreasing with 1000 mL in the past 24 hours - Continue with tube feedings. Seems to be tolerating. No significant residuals  -finished TPN on 08/27/14 -Enteral Feeding was increased 5/27 to 55 mL per hour.  PSVT  -Felt to be secondary to severe anxiety with dialysis as well as multiple co-morbidities and COPD/Cor Pulmonale  -Has been evaluated by EP.  -Cardiology following peripherally- follow K+ and Mg  -Continue IV metoprolol, now off amiodarone but has been limited by soft bp -V-Q scan with intermediate probability for pulmonary embolism.  -CTA 4/27, negative for PE  -Heart rate better controlled with scheduled intravenous metoprolol but has been limited by soft BP -Chest pain on 5/22, likely related to the fact that JP drain was accidentally pulled out, which resulted in severe abdominal pain. Chest x-ray did not show any concerning findings. EKG was nonischemic. Troponins are unremarkable. Don't anticipate further workup unless she has recurrence of her symptoms. -Patient continues to have rare intermittent paroxysmal episodes of SVT that are asymptomatic. -Unfortunately, the patient's soft blood pressures have limited use of intravenous metoprolol ESRD new, now on HD this admit complicated by intermittent hypotension AKI on CKD 4, due to sepsis, shock Ongoing hemodialysis per Nephrology - perm-cath placed 3/26 d/t AVF problems Patient AV fistula reviewed and not mature yet per Dr. Paulene Floor need to revisit prior to d/c -TPN weaned off 08/27/14 -Continue hemodialysis Tuesday, Thursday, Saturday while the patient is in the hospital  C. difficile colitis still on contact precautions -C diff positive on 4-12, Received 4 days of flagyl without improvement, then  started on PO vanc -Completed 3 weeks of Oral Vanc-stopped 5/7, this was 1 week after IV Zosyn was stopped 5/5 -Diarrhea resolved--stools are firm  -keep on contact precautions  ACA CVA with Lt sided weakness -TEE ;normal LV function; no LAA thrombus -Started on ASA  per Neuro -Will need ongoing long term rehab - PT/OT to cont to follow while inpatient -Patient remains very weak and deconditioned. 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 Fluids/volume managed with hemodialysis.  Elevated PA pressures on 2-D echo; CTA negative for PE - 07/23/2014 echo-- EF 60-61%, grade 1 diastolic dysfunction, Severe RV dilatation, PAP 90 Small PFO Has been evaluated by Cardiology. No intervention planned currently.  Anemia of critical illness/chronic disease -and bleeding from leak/fistula which has resolved. -s/p 2U PRBC 4/13 and 5/10 and 5/26 Aranesp/iron per renal -transfuse for hemoglobin less than 7 Acute hypoxic Respiratory Failure:  -This was secondary to H1N1, hypervolemia, HCAP >> resolved.  -also has COPD and Cor Pulmonale - patient complained of some increased shortness of breath today-- wheezing on exam - start scheduled Xopenex and Atrovent - presently stable on 2 L Thrombocytopenia; resolved.  Appears to be associated with sepsis Has had mild positive HIT test during this admission   Asthma/COPD/Cor Pulmonale Continue oxygen supplementation as needed Will continue pulmicort Continue scheduled xopenex and atrovent -Presently stable without any distress - 07/23/2014 echo-- EF 60-61%,  grade 1 diastolic dysfunction, Severe RV dilatation, PAP 90   Septic shock  -due to perforated prepyloric ulcer, required pressors -resolved currently  Diabetes mellitus 2 Did have episodes of Hypoglycemia in setting of NPO status. On TPN now, CBGs stabilizedin  120-160s. Hba1c 6.5 -08/27/14--question accuracy of CBG of 457 on BMP this am--likely contaminated with TPN  Abnormal TSH -Free T4 WNL, T3 1.8 -most likely sick thyroid with ongoing infection and body stress; also due to amiodarone use. TSH in 7 range -continue low dose synthroid given PSVT, now on IV synthroid   L Renal mass  -noted on [place], Indeterminate 1.9 cm hypoechoic mass off the interpolar region of the left kidney -may potentially represent a cyst however solid mass is not excluded -needs outpatient FU for this  Obesity Inadequate oral intake related to altered GI function as evidenced by limited intake, ongoing.  Body mass index is 35.03 kg/(m^2).  Fall on May 18 Examination did not reveal any injuries.   DVT prophylaxis: SCDs Code Status: FULL Family Communication: Discussed with patient. No family at bedside. Disposition Plan: Have been unable to transfer patient out of stepdown unit due to dyspnea and tachycardia. She also has complex wound care needs. Dialysis is ongoing. Not ready for discharge.  Consultants: Cardiology Nephrology PCCM Gen Surgery  Palliative care   Procedures/Studies: Ct Abdomen Pelvis Wo Contrast  08/22/2014   CLINICAL DATA:  Abdominal abscess. No intravenous contrast due to renal failure.  EXAM: CT ABDOMEN AND PELVIS WITHOUT CONTRAST  TECHNIQUE: Multidetector CT imaging of the abdomen and pelvis was performed following the standard protocol without IV contrast.  COMPARISON:  08/15/2014  FINDINGS: BODY WALL: Open laparotomy without fluid collection.  LOWER CHEST: Chronic triangular opacity in the posterior costophrenic sulcus on the left, atelectasis versus scarring  ABDOMEN/PELVIS:  Liver: No focal abnormality.  Biliary: High-density material within the gallbladder showing mild wall thickening which is chronic.  Pancreas: Unremarkable.  Spleen: Unremarkable.  Adrenals: Unremarkable.  Kidneys and ureters: No hydronephrosis or stone. Presumed 1 cm  cyst from the interpolar left kidney  Bladder: Decompressed.  No pathologic findings.  Reproductive: Calcified and noncalcified uterine fibroids, better seen on previous enhanced imaging.  Bowel: Right upper quadrant drain related to perforated pyloric ulcer status post Cheree Ditto patch. Specially visible on coronal imaging, there is a gas channel through the pylorus measuring 6 to 9 mm in diameter. No bowel obstruction. Enteroenterostomy noted in the pelvis. Oral contrast again seen in the colon. No notable colonic wall thickening in this patient with history of C difficile. Negative appendix. Percutaneous gastrojejunostomy tube is in good position.  Retroperitoneum: No mass or adenopathy.  Peritoneum: Known thick walled collection in the rectovaginal recess smaller than 08/15/2014 at 40 x 16 mm as compared to 51 x 32 mm. No new collection is identified. No free pneumoperitoneum.  Vascular: 3 cm fusiform infrarenal aortic aneurysm.  OSSEOUS: No acute abnormalities.  IMPRESSION: 1. Near the patient's pyloric Cheree Ditto patch is a transmural defect in the stomach wall, contiguous with the surgical drain. If repeat surgery is considered, oral contrast could confirm an open ulcer. 2. Decreased rectovaginal recess fluid collection, now 4 x 1.5 cm (previously 5 x 3 cm).   Electronically Signed   By: Marnee Spring M.D.   On: 08/22/2014 01:49   Ct Abdomen Pelvis Wo Contrast  08/15/2014   CLINICAL DATA:  Intra abdominal abscess.  EXAM: CT ABDOMEN AND PELVIS WITHOUT CONTRAST  TECHNIQUE: Multidetector CT imaging of the abdomen and pelvis  was performed following the standard protocol without IV contrast.  COMPARISON:  CT scan of Aug 03, 2014.  FINDINGS: Severe degenerative disc disease is noted at L5-S1. Minimal subsegmental atelectasis is noted posteriorly in the left lung base.  No gallstones are noted. No focal abnormality is noted in the liver, spleen or pancreas on these unenhanced images. Adrenal glands appear normal. Stable  exophytic cyst is seen arising from midpole of left kidney. No hydronephrosis or renal obstruction is noted. No renal or ureteral calculi are noted. 3 cm infrarenal abdominal aortic aneurysm is noted. Gastrojejunostomy tube is seen entering stomach percutaneously and with distal tip in the jejunum. JP surgical drain is seen entering right upper quadrant of abdomen underneath the liver with distal tip in the left upper quadrant. No significant fluid collection is noted around the drain. Fluid collection is again identified and posterior cul-de-sac of pelvis measuring 5.1 x 3.2 cm. Calcified degenerating fibroid is noted in the uterus. Urinary bladder is unremarkable. There is no evidence of bowel obstruction. The appendix appears normal. No significant adenopathy is noted. Large midline surgical anterior abdominal wall incision is noted.  IMPRESSION: Stable 3 cm infrarenal abdominal aortic aneurysm.  No change in position of JP surgical drain with distal tip in the left upper quadrant of the abdomen.  Interval placement of percutaneous gastrojejunostomy tube with distal tip in the jejunum.  Grossly stable size and appearance of 5 cm fluid collection seen in posterior cul-de-sac of the pelvis.  Continued presence uterine fibroids.   Electronically Signed   By: Lupita Raider, M.D.   On: 08/15/2014 15:15   Dg Chest 1 View  08/13/2014   CLINICAL DATA:  Shortness of breath  EXAM: CHEST  1 VIEW  COMPARISON:  08/03/2014  FINDINGS: Moderately severe cardiac enlargement. Central line in unchanged position on the right and left side. Mild vascular congestion with no evidence of edema effusion or consolidation.  IMPRESSION: No acute findings   Electronically Signed   By: Esperanza Heir M.D.   On: 08/13/2014 11:04   Dg Abd 1 View  08/03/2014   CLINICAL DATA:  NG tube placement  EXAM: ABDOMEN - 1 VIEW  COMPARISON:  07/13/2014  FINDINGS: NG tube tip is in the proximal stomach. The side port is near the GE junction.   IMPRESSION: NG tube tip in the proximal stomach.   Electronically Signed   By: Charlett Nose M.D.   On: 08/03/2014 17:18   Ct Angio Chest Pe W/cm &/or Wo Cm  07/29/2014   CLINICAL DATA:  Shortness of breath. Patients brief history is as follows: "smoker with hx HTN, ESRD on HD (just began this admit), COPD, failed L AV fistula, initially admitted 3/16 with flu and acute hypoxic resp failure. Ultimately tx to [facility] due to need for HD. 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 Patient develops septic shock on 4-1, she was started on pressors and wean off pressors on 4-3. Subsequently she was diagnosed with pelvic abscess by CT scan perform on 4-7. She underwent pelvic drain placement by IR. Patient also develops C. diff diagnosed 4-12. C diff was not responding flagyl, and she was started on vancomycin 4-16. She also develops run of SVT. Cardiology has been helping with management. Course complicated with high RV pressure and concerns for PE; also with fistula formation between anastomosis  and blake drain, making her back to NPO state and in need of TPN.  EXAM: CT ANGIOGRAPHY CHEST WITH CONTRAST  TECHNIQUE: Multidetector CT imaging of the chest was performed using the standard protocol during bolus administration of intravenous contrast. Multiplanar CT image reconstructions and MIPs were obtained to evaluate the vascular anatomy.  CONTRAST:  80mL OMNIPAQUE IOHEXOL 350 MG/ML SOLN  COMPARISON:  Chest radiograph, 07/14/2014  FINDINGS: Angiographic study: No evidence of a pulmonary embolus. Aorta is normal in caliber. No dissection.  Thoracic inlet:  No masses or adenopathy.  Mediastinum and hila: Heart is mildly enlarged due to right heart enlargement. No mediastinal or hilar masses or pathologically enlarged lymph nodes.  Lungs and pleura: No lung consolidation or edema. Mild emphysema. There are minor  areas of peripheral reticular scarring and minor dependent subsegmental atelectasis. No pleural effusion or pneumothorax.  Limited upper abdomen: Partly image catheter lies above the proximal stomach. No acute findings in the upper abdomen.  Musculoskeletal:  No osteoblastic or osteolytic lesions.  Review of the MIP images confirms the above findings.  IMPRESSION: 1. No evidence of a pulmonary embolus. 2. No acute findings. 3. Cardiomegaly. 4. Mild emphysema.  No evidence of pneumonia or pulmonary edema.   Electronically Signed   By: Amie Portland M.D.   On: 07/29/2014 15:53   Ct Abdomen Pelvis W Contrast  08/04/2014   CLINICAL DATA:  New epigastric abdominal pain.  EXAM: CT ABDOMEN AND PELVIS WITH CONTRAST  TECHNIQUE: Multidetector CT imaging of the abdomen and pelvis was performed using the standard protocol following bolus administration of intravenous contrast.  CONTRAST:  OMNIPAQUE IOHEXOL 300 MG/ML  SOLN  COMPARISON:  07/16/2014  FINDINGS: There is a small volume extraluminal air alongside the JP drain in the anterior abdominal midline. Source of the air is not clear. No other extraluminal air is evident in the abdomen or pelvis. No undrained fluid collection is evident alongside the JP drain. There is a 3.5 x 5.7 x 3.0 cm contained collection in the pelvic cul-de-sac which could represent a small abscess.  There is an open midline abdominal wound without associated drainable collection. There is herniation of abdominal fat through the abdominal wall musculature at the location of the wound.  There are normal appearances of the liver, spleen, pancreas, adrenals and kidneys with the exception of a 1.8 cm simple left renal cyst. There is an unchanged 3 cm infrarenal abdominal aortic aneurysm. The duodenum is draped over the aneurysm but appears intact.  There are multiple uterine fibroids.  There is no significant abnormality in the lower chest.  IMPRESSION: *Extraluminal air alongside the JP drain in  the anterior abdominal midline, etiology not apparent on this scan *3.0 x 3.5 x 5.7 cm collection in the pelvic cul-de-sac *Unchanged 3 cm infrarenal abdominal aortic aneurysm directly adjacent to the duodenum *Open wound in the abdominal midline. No fluid collection near the wound.   Electronically Signed   By: Ellery Plunk M.D.   On: 08/04/2014 02:24   Ir Gastrostomy Tube Mod Sed  08/12/2014   CLINICAL DATA:  Perforated duodenal ulcer, post g patch with persistent leak. Percutaneous gastrojejunostomy as requested for gastric decompression and small bowel enteral feeding support.  EXAM: PERC PLACEMENT GASTROSTOMY;  CONVERT G-TUBE TO G-JTUBE  FLUOROSCOPY TIME:  16 minutes 24 seconds, 1016.6 mGy  TECHNIQUE: The procedure, risks, benefits, and alternatives were explained to the patient. Questions regarding the procedure were encouraged and answered. The patient understands and consents to the procedure.  As antibiotic prophylaxis, cefazolin 2 g was ordered pre-procedure and administered intravenously within one hour of incision. . A 5 French angiographic catheter was placed as orogastric tube. The upper abdomen was prepped with Betadine, draped in usual sterile fashion, and infiltrated locally with 1% lidocaine. 1 mg glucagon was administered intravenously to facilitate gastric distention and slow peristalsis. Stomach was insufflated using air through the orogastric tube. An 65 French sheath needle was advanced percutaneously into the gastric lumen under fluoroscopy. Gas could be aspirated and a small contrast injection confirmed intraluminal spread. The sheath was exchanged over a guidewire for a 9 [place] vascular sheath, through which the snare device was advanced and used to snare a guidewire passed through the orogastric tube. This was withdrawn, and the snare attached to the 20 French pull-through gastrostomy tube, which was advanced antegrade, positioned with the internal bumper securing the anterior  gastric wall to the anterior abdominal wall. Small contrast injection confirms appropriate positioning. The external bumper was applied and the catheter was flushed.  A 5 French angiographic catheter was placed coaxially through the gastrostomy tube and used to direct an angled stiff glidewire across the pylorus, beyond the proximal duodenal perforation, and into the proximal jejunum just beyond the ligament of Treitz. Over this, a coaxial jejunostomy feeding adapter was advanced. Contrast injection confirmed patency and appropriate position of the gastric and jejunal lumens. No extravasation. The lumens were flushed with saline and capped. The patient tolerated the procedure well.  COMPLICATIONS: COMPLICATIONS none  IMPRESSION: 1. Technically successful 20 French pull-through gastrostomy placement under fluoroscopy. 2. Technically successful coaxial jejunostomy feeding adapter placement to the ligament of Treitz.   Electronically Signed   By: Corlis Leak M.D.   On: 08/12/2014 16:12   Ir Adele Schilder Tamsen Snider Convert Gastr-jej Per W/fl Mod Sed  08/12/2014   CLINICAL DATA:  Perforated duodenal ulcer, post g patch with persistent leak. Percutaneous gastrojejunostomy as requested for gastric decompression and small bowel enteral feeding support.  EXAM: PERC PLACEMENT GASTROSTOMY;  CONVERT G-TUBE TO G-JTUBE  FLUOROSCOPY TIME:  16 minutes 24 seconds, 1016.6 mGy  TECHNIQUE: The procedure, risks, benefits, and alternatives were explained to the patient. Questions regarding the procedure were encouraged and answered. The patient understands and consents to the procedure. As antibiotic prophylaxis, cefazolin 2 g was ordered pre-procedure and administered intravenously within one hour of incision. . A 5 French angiographic catheter was placed as orogastric tube. The upper abdomen was prepped with Betadine, draped in usual sterile fashion, and infiltrated locally with 1% lidocaine. 1 mg glucagon was administered intravenously to  facilitate gastric distention and slow peristalsis. Stomach was insufflated using air through the orogastric tube. An 4 French sheath needle was advanced percutaneously into the gastric lumen under fluoroscopy. Gas could be aspirated and a small contrast injection confirmed intraluminal spread. The sheath was exchanged over a guidewire for a 9 [place] vascular sheath, through which the snare device was advanced and used to snare a guidewire passed through the orogastric tube. This was withdrawn, and the snare attached to the 20 French pull-through gastrostomy tube, which was advanced antegrade, positioned with the internal bumper securing the anterior gastric wall to the anterior abdominal wall. Small contrast injection confirms appropriate positioning. The external bumper was applied and the catheter was flushed.  A 5 French angiographic catheter was placed coaxially through the gastrostomy tube and used to direct an angled stiff glidewire across the pylorus, beyond the proximal duodenal perforation, and into the proximal jejunum just beyond the ligament  of Treitz. Over this, a coaxial jejunostomy feeding adapter was advanced. Contrast injection confirmed patency and appropriate position of the gastric and jejunal lumens. No extravasation. The lumens were flushed with saline and capped. The patient tolerated the procedure well.  COMPLICATIONS: COMPLICATIONS none  IMPRESSION: 1. Technically successful 20 French pull-through gastrostomy placement under fluoroscopy. 2. Technically successful coaxial jejunostomy feeding adapter placement to the ligament of Treitz.   Electronically Signed   By: Corlis Leak M.D.   On: 08/12/2014 16:12   Dg Chest Port 1 View  08/23/2014   CLINICAL DATA:  Left chest pain  EXAM: PORTABLE CHEST - 1 VIEW  COMPARISON:  08/18/2014  FINDINGS: Patient is rotated.  Lungs are essentially clear. No focal consolidation. No pleural effusion or pneumothorax.  Cardiomegaly.  Left IJ venous catheter  terminates at the cavoatrial junction. Right IJ dual lumen dialysis catheter terminates in the lower SVC.  IMPRESSION: No evidence of acute cardiopulmonary disease.   Electronically Signed   By: Charline Bills M.D.   On: 08/23/2014 19:17   Dg Chest Port 1 View  08/18/2014   CLINICAL DATA:  Shortness of breath at rest.  Having hemodialysis.  EXAM: PORTABLE CHEST - 1 VIEW  COMPARISON:  08/15/2014.  FINDINGS: The cardiac silhouette remains mildly enlarged. Stable right jugular double-lumen catheter. Clear lungs. The pulmonary vasculature remains prominent. No pleural fluid. Minimal right shoulder degenerative changes.  IMPRESSION: Stable cardiomegaly and pulmonary vascular congestion.   Electronically Signed   By: Beckie Salts M.D.   On: 08/18/2014 09:47   Dg Chest Port 1 View  08/15/2014   CLINICAL DATA:  Shortness of breath.  EXAM: PORTABLE CHEST - 1 VIEW  COMPARISON:  08/13/2014 and multiple prior chest radiographs  FINDINGS: Cardiomegaly and pulmonary vascular congestion again noted.  A right IJ central venous catheter is noted with tips overlying the upper and mid SVC.  A left central venous catheter is present with tip overlying the lower SVC.  There is no evidence of focal airspace disease, pulmonary edema, suspicious pulmonary nodule/mass, pleural effusion, or pneumothorax. No acute bony abnormalities are identified.  IMPRESSION: Cardiomegaly with pulmonary vascular congestion.   Electronically Signed   By: Harmon Pier M.D.   On: 08/15/2014 17:28   Dg Chest Port 1v Same Day  08/03/2014   CLINICAL DATA:  59 year old female dialysis patient with severe shortness of breath. Respiratory distress. Initial encounter.  EXAM: PORTABLE CHEST - 1 VIEW SAME DAY  COMPARISON:  Chest CTA 07/29/2014 and earlier.  FINDINGS: Portable AP semi upright view at 1400 hrs. Bilateral IJ approach chest catheters appears stable. Stable lung volumes. Stable cardiomegaly and mediastinal contours. No pneumothorax, pulmonary  edema, pleural effusion or consolidation. No acute pulmonary opacity identified.  IMPRESSION: No acute cardiopulmonary abnormality.   Electronically Signed   By: Odessa Fleming M.D.   On: 08/03/2014 14:18   Dg Abd Portable 1v  08/23/2014   CLINICAL DATA:  JP drain, broken, initial encounter  EXAM: PORTABLE ABDOMEN - 1 VIEW  COMPARISON:  CT abdomen pelvis dated 08/21/2014  FINDINGS: A portion of the right flank is excluded from this single image.  The patient's right abdominal surgical drain is not visualized.  Gastrojejunostomy in satisfactory position.  IMPRESSION: The patient's right abdominal surgical drain is not visualized.  Gastrojejunostomy in satisfactory position.   Electronically Signed   By: Charline Bills M.D.   On: 08/23/2014 19:11   Ct Image Guided Fluid Drain By Catheter  08/17/2014   CLINICAL DATA:  Postoperative intra-abdominal abscess, subsequent encounter. Small pelvic fluid collection.  EXAM: CT-GUIDED DRAIN PLACEMENT IN PELVIC FLUID COLLECTION  Physician: Rachelle HoraAdam R. Lowella DandyHenn, MD  FLUOROSCOPY TIME:  None  MEDICATIONS: 50 mcg fentanyl  ANESTHESIA/SEDATION: Patient was monitored by a radiology nurse during the procedure.  PROCEDURE: Informed consent was obtained for drain placement. Patient was placed on her left side. Images through the pelvis were obtained. The right buttock was prepped and draped in sterile fashion. 1% lidocaine was used for a local anesthetic. An 18 gauge needle was directed into the pelvic fluid collection with CT guidance from a transgluteal approach. Thick yellow purulent fluid was aspirated. A stiff Amplatz wire was placed. The tract was dilated to accommodate a 10.2 [place] multipurpose drain. It was technically difficult to advance the drain into the collection due to the small size of the collection and the patient's body habitus. Eventually, the drain was successfully placed within the collection. 5 mL of yellow purulent fluid was obtained. Catheter was sutured to the skin  and attached to a suction bulb. Fluid was sent for culture.  FINDINGS: Small fluid collection just posterior to the uterus. A total of 5 mL of yellow purulent fluid was removed.  Estimated blood loss: Minimal  COMPLICATIONS: None  IMPRESSION: CT-guided placement of a drainage catheter in the small pelvic fluid collection.   Electronically Signed   By: Richarda OverlieAdam  Henn M.D.   On: 08/17/2014 17:21         Subjective: Patient is feeling better today. She is breathing better. Denies any chest discomfort, ferrous breath, nausea, vomiting, diarrhea. Abdominal pain is unchanged. She endorsed significant anxiety with dialysis.  Objective: Filed Vitals:   08/28/14 0700 08/28/14 0722 08/28/14 0725 08/28/14 1151  BP: 96/71  96/71 95/64  Pulse: 103  105 107  Temp:   97.1 F (36.2 C) 98.1 F (36.7 C)  TempSrc:   Axillary Oral  Resp: 14  13 16   Height:      Weight:      SpO2: 100% 100% 100% 97%    Intake/Output Summary (Last 24 hours) at 08/28/14 1226 Last data filed at 08/28/14 0600  Gross per 24 hour  Intake 3537.67 ml  Output   1670 ml  Net 1867.67 ml   Weight change: -7.5 kg (-16 lb 8.6 oz) Exam:   General:  Pt is alert, follows commands appropriately, not in acute distress  HEENT: No icterus, No thrush,  [place]/AT  Cardiovascular: RRR, S1/S2, no rubs, no gallops  Respiratory: Bibasilar crackles. Minimal basilar wheeze. Good air movement  Abdomen: Soft/+BS, LUQ, RUQ tender, non distended, no guarding  Extremities: No edema, No lymphangitis, No petechiae, No rashes, no synovitis  Data Reviewed: Basic Metabolic Panel:  Recent Labs Lab 08/21/14 1526 08/22/14 0530 08/23/14 0415 08/24/14 0520 08/25/14 0525 08/25/14 0747 08/27/14 0539  NA 136  --  137  --  138 139 132*  K 4.1  --  4.3  --  4.5 4.5 3.5  CL 101  --  99*  --  101 100* 96*  CO2 29  --  30  --  28 27 27   GLUCOSE 107*  --  120*  --  83 90 457*  BUN 11  --  14  --  34* 34* 34*  CREATININE 1.71*  --  2.43*  --  5.10*  5.05* 4.56*  CALCIUM 7.7*  --  8.3*  --  8.3* 8.1* 7.8*  MG  --  2.0 2.0 2.0  --   --  1.7  PHOS 2.1* 3.3 3.3 5.0*  --  6.1* 4.4   Liver Function Tests:  Recent Labs Lab 08/21/14 1526 08/25/14 0525 08/25/14 0747 08/27/14 0539  AST  --  17  --  17  ALT  --  9*  --  8*  ALKPHOS  --  127*  --  108  BILITOT  --  1.3*  --  1.0  PROT  --  6.7  --  6.6  ALBUMIN 2.0* 2.0* 2.1* 1.9*   No results for input(s): LIPASE, AMYLASE in the last 168 hours. No results for input(s): AMMONIA in the last 168 hours. CBC:  Recent Labs Lab 08/23/14 0415 08/24/14 0520 08/25/14 0525 08/26/14 0648 08/27/14 0539  WBC 15.3* 15.7* 13.6* 13.2* 13.9*  NEUTROABS  --  12.5*  --   --   --   HGB 7.8* 7.6* 7.9* 7.1* 6.8*  HCT 26.2* 25.0* 25.6* 24.1* 24.7*  MCV 94.6 96.2 95.9 97.2 103.8*  PLT 307 309 357 327 323   Cardiac Enzymes:  Recent Labs Lab 08/23/14 1944 08/24/14 0221 08/24/14 0630  TROPONINI 0.04* 0.03 0.03   BNP: Invalid input(s): POCBNP CBG:  Recent Labs Lab 08/24/14 1631 08/25/14 0110 08/25/14 1741 08/27/14 1218 08/27/14 1817  GLUCAP 139* 122* 124* 112* 157*    No results found for this or any previous visit (from the past 240 hour(s)).   Scheduled Meds: . sodium chloride   Intravenous Once  . antiseptic oral rinse  7 mL Mouth Rinse q12n4p  . budesonide (PULMICORT) nebulizer solution  0.25 mg Nebulization BID  . cefTAZidime (FORTAZ)  IV  2 g Intravenous Q T,Th,Sa-HD  . chlorhexidine  15 mL Mouth Rinse BID  . darbepoetin (ARANESP) injection - DIALYSIS  200 mcg Intravenous Q Thu-HD  . feeding supplement (VITAL AF 1.2 CAL)  1,000 mL Per Tube Q24H  . ipratropium  0.5 mg Nebulization TID  . levalbuterol  1.25 mg Nebulization TID  . levothyroxine  12.5 mcg Intravenous Daily  . metoprolol  2.5 mg Intravenous 4 times per day  . metronidazole  500 mg Intravenous Q8H  . sodium chloride  10-40 mL Intracatheter Q12H  . sodium hypochlorite   Topical BID  . vitamin C  1,000 mg Per  Tube Daily   Continuous Infusions: . sodium chloride 10 mL/hr at 08/19/14 0950     Zamantha Strebel, DO  [facility] Pager [phone]  If 7PM-7AM, please contact night-coverage [email or web address] Password [email or web address] 08/28/2014, 12:26 PM   LOS: 72 days

## 2014-08-28 NOTE — Progress Notes (Signed)
[facility] Progress Note  Assessment/Plan: 1. New ESRD 1. On TTS schedule as inpatient 2. No permanent access, using TDC 3. CLIP on hold as still significant inpatient issues 4. Holding on permanent access for now, AVF from 01/28/14; will need to address prior to DC 1. Last seen by [facility] 06/2014; likely needing revision 2. [facility] working well for now 5. 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 Try 3K bath next for rhythm prevention 2. Vol Status 1. Using Bed Weights currently, try to transition to standing 2. Still appears volume overload, cont gentle UF but also with > 1L/d loss from GI drains 3. Try for 3.5L UF today Tx 4. Having trouble achieving UF 2/2 SVT and hypotension with HD 3. Pseudomonal pelvic abscess on Ceftaz/Flagyl, hx/o C Diff 4. CVA acute by MRI 3/19 5. Perf pyloric ulcer SP exlap, w duod fistula to suction high output off of octreotide, CCS following 6. Nutrition w G-J tube on TF's (VItal)+trying to wean TNA-  7. C diff - s/p po Vanc - on flagyl with ABX 8. Anemia  1. aranesp to 200 qThurs 2. Finished Fe load 5/21 3. Transfuse 2u PRBC  9. MBD - iPTH 130 5/12, low P resolved  10. 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 Vitamin C deficiency - getting 1000 mg IV daily w TNA, repeat level ordered for Clydia Llano MD  08/28/2014, 9:55 AM   Subjective:    HD yesterday, 4h, 1.2L, again had SVT   Objective Filed Vitals:   08/28/14 0600 08/28/14 0700 08/28/14 0722 08/28/14 0725  BP: 105/64 96/71  96/71  Pulse: 108 103  105  Temp:    97.1 F (36.2 C)  TempSrc:    Axillary  Resp: Height:      Weight:      SpO2: 100% 100% 100% 100%   Physical Exam General: Walking with PT Heart:tachy reg Lungs:clear to A/P now Abdomen: obese, anasarca with dependent back/buttock edema Extremities: 1-2+ dependent edema Dialysis Access: right IJ   Dialysis Orders:  Additional Objective Labs: Basic Metabolic  Panel:  Recent Labs Lab 08/24/14 0520 08/25/14 0525 08/25/14 0747 08/27/14 0539  NA  --  138 139 132*  K  --  4.5 4.5 3.5  CL  --  101 100* 96*  CO2  --  GLUCOSE  --  83 90 457*  BUN  --  34* 34* 34*  CREATININE  --  5.10* 5.05* 4.56*  CALCIUM  --  8.3* 8.1* 7.8*  PHOS 5.0*  --  6.1* 4.4   Liver Function Tests:  Recent Labs Lab 08/25/14 0525 08/25/14 0747 08/27/14 0539  AST 17  --  17  ALT 9*  --  8*  ALKPHOS 127*  --  108  BILITOT 1.3*  --  1.0  PROT 6.7  --  6.6  ALBUMIN 2.0* 2.1* 1.9*  CBC:  Recent Labs Lab 08/23/14 0415 08/24/14 0520 08/25/14 0525 08/26/14 0648 08/27/14 0539  WBC 15.3* 15.7* 13.6* 13.2* 13.9*  NEUTROABS  --  12.5*  --   --   --   HGB 7.8* 7.6* 7.9* 7.1* 6.8*  HCT 26.2* 25.0* 25.6* 24.1* 24.7*  MCV 94.6 96.2 95.9 97.2 103.8*  PLT 307 309 357 327 323   Blood Culture    Component Value Date/Time   SDES ABSCESS PELVIS 08/17/2014 1451   SPECREQUEST NONE 08/17/2014  1451   CULT  08/17/2014 1451    NO GROWTH 3 DAYS Performed at [facility]    REPTSTATUS 08/21/2014 FINAL 08/17/2014 1451    Cardiac Enzymes:  Recent Labs Lab 08/23/14 1944 08/24/14 0221 08/24/14 0630  TROPONINI 0.04* 0.03 0.03   CBG:  Recent Labs Lab 08/24/14 1631 08/25/14 0110 08/25/14 1741 08/27/14 1218 08/27/14 1817  GLUCAP 139* 122* 124* 112* 157*    Medications: . sodium chloride 10 mL/hr at 08/19/14 0950   . sodium chloride   Intravenous Once  . antiseptic oral rinse  7 mL Mouth Rinse q12n4p  . budesonide (PULMICORT) nebulizer solution  0.25 mg Nebulization BID  . cefTAZidime (FORTAZ)  IV  2 g Intravenous Q T,Th,Sa-HD  . chlorhexidine  15 mL Mouth Rinse BID  . darbepoetin (ARANESP) injection - DIALYSIS  200 mcg Intravenous Q Thu-HD  . feeding supplement (VITAL AF 1.2 CAL)  1,000 mL Per Tube Q24H  . ipratropium  0.5 mg Nebulization TID  . levalbuterol  1.25 mg Nebulization TID  . levothyroxine  12.5 mcg Intravenous Daily  .  metoprolol  2.5 mg Intravenous 4 times per day  . metronidazole  500 mg Intravenous Q8H  . sodium chloride  10-40 mL Intracatheter Q12H  . sodium hypochlorite   Topical BID  . vitamin C  1,000 mg Per Tube Daily

## 2014-08-28 NOTE — Progress Notes (Signed)
Physical Therapy Treatment Patient Details Name: [name] MRN: [id number] DOB: [birth year] Today's Date: 08/28/2014    History of Present Illness [name] is a 59 y.o. female with a history of CKD and hypertension came to the [facility] ED on 3/16 complaining of shortness of breath and generalized weakness x 2 weeks. Admitted with acute hypoxemic respiratory failure. During hospital stay noted to have weakness of her left side. 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  She developed septic shock 4/1 with pressors weaned off 4/3.  Began CRRT and pressors restarted 4/4; Developed pelvic abcess 4/7 with pelvic drain placed 4/8.  weaned off pressors 4/9.  Runs of SVT 4/5 and 4/11. Prolonged hospital course with hypoxemia and hypotension    PT Comments    Pt with excellent progression with gait and attitude today and very willing to work to get stronger. Pt benefits from encouragement and reassurance. RN finishing dressing change on arrival and applied abdominal binder in standing prior to gait. Pt encouraged to be OOB throughout the weekend and moving legs for strengthening daily. Will continue to follow.   Follow Up Recommendations  CIR;Supervision/Assistance - 24 hour     Equipment Recommendations       Recommendations for Other Services Rehab consult     Precautions / Restrictions Precautions Precautions: Fall Precaution Comments: 2 abdominal drains to foley bags, G tube    Mobility  Bed Mobility Overal bed mobility: Needs Assistance Bed Mobility: Supine to Sit     Supine to sit: Min assist;HOB elevated     General bed mobility comments: cues for sequence with only min assist to move LLE to EOB and fully scoot to EOB  Transfers Overall transfer level: Needs assistance   Transfers: Sit to/from Stand Sit to Stand: Min assist         General  transfer comment: x 2 trials from bed and chair with cues for hand placement, assist to anterior translation and elevation   Ambulation/Gait Ambulation/Gait assistance: Min assist;+2 safety/equipment Ambulation Distance (Feet): 48 Feet Assistive device: Rolling walker (2 wheeled) Gait Pattern/deviations: Step-through pattern;Decreased stride length;Narrow base of support;Trunk flexed   Gait velocity interpretation: Below normal speed for age/gender General Gait Details: Pt with positive attitude today and willing to progress gait. Pt walked 26' with chair pulled to her, seated rest and then walked an additional 48'. Cues for posture, position in RW, breathing technique and encouragement to maximize distance. Cues for increased BOS   Stairs            Wheelchair Mobility    Modified Rankin (Stroke Patients Only)       Balance Overall balance assessment: Needs assistance   Sitting balance-Leahy Scale: Good       Standing balance-Leahy Scale: Poor                      Cognition Arousal/Alertness: Awake/alert Behavior During Therapy: WFL for tasks assessed/performed Overall Cognitive Status: Within Functional Limits for tasks assessed                      Exercises      General Comments        Pertinent Vitals/Pain Pain Score: 4  Pain Location: abdomen Pain Descriptors / Indicators: Sore Pain Intervention(s): Repositioned  HR 108-140 with periods of tachycardia nonsustained  Home Living                      Prior Function            PT Goals (current goals can now be found in the care plan section) Progress towards PT goals: Progressing toward goals    Frequency       PT Plan Discharge plan needs to be updated    Co-evaluation             End of Session Equipment Utilized During Treatment: Gait belt;Oxygen Activity Tolerance: Patient tolerated treatment well Patient left: in chair;with call bell/phone within  reach;with nursing/sitter in room     Time: 0840-0920 PT Time Calculation (min) (ACUTE ONLY): 40 min  Charges:  $Gait Training: 23-37 mins $Therapeutic Activity: 8-22 mins                    G Codes:      Delorse Lekabor, Bette Brienza Beth 08/28/2014, 10:43 AM Delaney MeigsMaija Tabor Terriona Horlacher, PT [phone]

## 2014-08-28 NOTE — Progress Notes (Signed)
58 Days Post-Op  Subjective: Green drainage from the wound, fistula draining oK, GJ tube for feeding and draining stomach.  Otherwise stable from our stand point  Objective: Vital signs in last 24 hours: Temp:  [97.1 F (36.2 C)-98.9 F (37.2 C)] 97.1 F (36.2 C) (05/27 0725) Pulse Rate:  [103-152] 105 (05/27 0725) Resp:  [12-34] 13 (05/27 0725) BP: (77-130)/(54-88) 96/71 mmHg (05/27 0725) SpO2:  [96 %-100 %] 100 % (05/27 0725) FiO2 (%):  [28 %] 28 % (05/27 0725) Weight:  [90 kg (198 lb 6.6 oz)-97 kg (213 lb 13.5 oz)] 97 kg (213 lb 13.5 oz) (05/27 0500) Last BM Date: 08/27/14 Urine 75 1225 from drain recorded 525 from gastrostomy recorded Afebrile, Tachycardia No labs this Am  Intake/Output from previous day: 05/26 0701 - 05/27 0700 In: 3537.7 [I.V.:884.2; Blood:670; NG/GT:1733.5; IV Piggyback:250] Out: 1745 [Urine:75; Drains:1750] Intake/Output this shift:    General appearance: alert, cooperative and no distress GI: soft open wound OK lots of green colored soupy exudate on dressing.  The lower portion of the wound is not packed, it is also the deepest.  Tolerating TF  Having stools with TF via jejunostomy tube  Lab Results:   Recent Labs  08/26/14 0648 08/27/14 0539  WBC 13.2* 13.9*  HGB 7.1* 6.8*  HCT 24.1* 24.7*  PLT 327 323    BMET  Recent Labs  08/27/14 0539  NA 132*  K 3.5  CL 96*  CO2 27  GLUCOSE 457*  BUN 34*  CREATININE 4.56*  CALCIUM 7.8*   PT/INR No results for input(s): LABPROT, INR in the last 72 hours.   Recent Labs Lab 08/21/14 0825 08/21/14 1526 08/25/14 0525 08/25/14 0747 08/27/14 0539  AST  --   --  17  --  17  ALT  --   --  9*  --  8*  ALKPHOS  --   --  127*  --  108  BILITOT  --   --  1.3*  --  1.0  PROT  --   --  6.7  --  6.6  ALBUMIN 1.6* 2.0* 2.0* 2.1* 1.9*     Lipase  No results found for: LIPASE   Studies/Results: No results found.  Medications: . sodium chloride   Intravenous Once  . antiseptic oral rinse   7 mL Mouth Rinse q12n4p  . budesonide (PULMICORT) nebulizer solution  0.25 mg Nebulization BID  . cefTAZidime (FORTAZ)  IV  2 g Intravenous Q T,Th,Sa-HD  . chlorhexidine  15 mL Mouth Rinse BID  . darbepoetin (ARANESP) injection - DIALYSIS  200 mcg Intravenous Q Thu-HD  . feeding supplement (VITAL AF 1.2 CAL)  1,000 mL Per Tube Q24H  . ipratropium  0.5 mg Nebulization TID  . levalbuterol  1.25 mg Nebulization TID  . levothyroxine  12.5 mcg Intravenous Daily  . metoprolol  2.5 mg Intravenous 4 times per day  . metronidazole  500 mg Intravenous Q8H  . sodium chloride  10-40 mL Intracatheter Q12H  . vitamin C  1,000 mg Per Tube Daily   . sodium chloride 10 mL/hr at 08/19/14 0950   Assessment/Plan SOB/hypoxia/volume over load 06/17/14 Pneumoperitoneum 07/01/14 Perforated pyloric ulcer with diffuse peritonitis, ischemic necrosis of mid ileum, possibly embolic Exploratory laparotomy, evacuation of ascites, closure of perforated pyloric ulcer with omental patch, segmental resection of 14 inches of ileum with primary anastomosis Doppler evaluation of small bowel mesenteric arterial vessels.07/01/14, Dr. 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  BP is a bit low but appears to be at her baseline. I will use some dakins for a few day for open abdominal wound.    LOS: 72 days    [name] 08/28/2014

## 2014-08-28 NOTE — Progress Notes (Signed)
Pt discussed in quality collaborative meeting on 5/26- pt is not appropriate for SNF placement at this time.  CSW will continue to follow.  Merlyn LotJenna Holoman, LCSWA Clinical Social Worker [phone]

## 2014-08-29 LAB — CBC
HCT: 29.3 % — ABNORMAL LOW (ref 36.0–46.0)
HEMATOCRIT: 31 % — AB (ref 36.0–46.0)
HEMOGLOBIN: 9.1 g/dL — AB (ref 12.0–15.0)
HEMOGLOBIN: 9.9 g/dL — AB (ref 12.0–15.0)
MCH: 29.4 pg (ref 26.0–34.0)
MCH: 30 pg (ref 26.0–34.0)
MCHC: 31.1 g/dL (ref 30.0–36.0)
MCHC: 31.9 g/dL (ref 30.0–36.0)
MCV: 94.8 fL (ref 78.0–100.0)
MCV: 93.9 fL (ref 78.0–100.0)
Platelets: 303 10*3/uL (ref 150–400)
Platelets: 229 10*3/uL (ref 150–400)
RBC: 3.09 MIL/uL — ABNORMAL LOW (ref 3.87–5.11)
RBC: 3.3 MIL/uL — ABNORMAL LOW (ref 3.87–5.11)
RDW: 22.3 % — ABNORMAL HIGH (ref 11.5–15.5)
RDW: 22.4 % — ABNORMAL HIGH (ref 11.5–15.5)
WBC: 14.3 10*3/uL — ABNORMAL HIGH (ref 4.0–10.5)
WBC: 20.5 10*3/uL — AB (ref 4.0–10.5)

## 2014-08-29 LAB — RENAL FUNCTION PANEL
Albumin: 1.9 g/dL — ABNORMAL LOW (ref 3.5–5.0)
Anion gap: 10 (ref 5–15)
BUN: 32 mg/dL — ABNORMAL HIGH (ref 6–20)
CALCIUM: 8.3 mg/dL — AB (ref 8.9–10.3)
CO2: 28 mmol/L (ref 22–32)
Chloride: 102 mmol/L (ref 101–111)
Creatinine, Ser: 4.48 mg/dL — ABNORMAL HIGH (ref 0.44–1.00)
GFR calc Af Amer: 12 mL/min — ABNORMAL LOW (ref >60)
GFR calc non Af Amer: 10 mL/min — ABNORMAL LOW (ref >60)
Glucose, Bld: 104 mg/dL — ABNORMAL HIGH (ref 65–99)
Phosphorus: 4.4 mg/dL (ref 2.5–4.6)
Potassium: 3.5 mmol/L (ref 3.5–5.1)
SODIUM: 140 mmol/L (ref 135–145)

## 2014-08-29 MED ORDER — LORAZEPAM 2 MG/ML IJ SOLN
1.0000 mg | Freq: Once | INTRAMUSCULAR | Status: AC
  Administered 2014-08-29: 1 mg via INTRAVENOUS
  Filled 2014-08-29: qty 1

## 2014-08-29 MED ORDER — HYDROMORPHONE HCL 1 MG/ML IJ SOLN
INTRAMUSCULAR | Status: AC
  Administered 2014-08-29: 1 mg via INTRAVENOUS
  Filled 2014-08-29: qty 1

## 2014-08-29 MED ORDER — AMIODARONE HCL IN DEXTROSE 360-4.14 MG/200ML-% IV SOLN
30.0000 mg/h | INTRAVENOUS | Status: AC
  Administered 2014-08-29 – 2014-09-01 (×6): 30 mg/h via INTRAVENOUS
  Filled 2014-08-29 (×13): qty 200

## 2014-08-29 MED ORDER — HYDROMORPHONE HCL 1 MG/ML IJ SOLN
INTRAMUSCULAR | Status: AC
  Filled 2014-08-29: qty 1

## 2014-08-29 MED ORDER — LEVALBUTEROL HCL 1.25 MG/0.5ML IN NEBU
1.2500 mg | INHALATION_SOLUTION | Freq: Once | RESPIRATORY_TRACT | Status: AC
  Administered 2014-08-29: 1.25 mg via RESPIRATORY_TRACT
  Filled 2014-08-29: qty 0.5

## 2014-08-29 MED ORDER — AMIODARONE HCL IN DEXTROSE 360-4.14 MG/200ML-% IV SOLN
60.0000 mg/h | INTRAVENOUS | Status: AC
  Administered 2014-08-29: 60 mg/h via INTRAVENOUS
  Filled 2014-08-29: qty 200

## 2014-08-29 NOTE — Progress Notes (Signed)
[facility] Progress Note  Assessment/Plan: 1. New ESRD 1. On TTS schedule as inpatient 2. No permanent access, using TDC 3. CLIP on hold as still significant inpatient issues 4. Holding on permanent access for now, AVF from 01/28/14; will need to address prior to DC 1. Last seen by [facility] 06/2014; likely needing revision 2. [facility] working well for now 5. 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 Try 4K bath next for rhythm prevention 2. Vol Status 1. Using Bed Weights currently, try to transition to standing 2. Still appears volume overload, cont gentle UF but also with > 1L/d loss from GI drains 3. Try for 3.5L UF this Tx  4. Having trouble achieving UF 2/2 SVT and hypotension with HD 3. Pseudomonal pelvic abscess on Ceftaz/Flagyl, hx/o C Diff 4. CVA acute by MRI 3/19 5. Perf pyloric ulcer SP exlap, w duod fistula to suction high output off of octreotide, CCS following 6. Nutrition w G-J tube on TF's (VItal)+trying to wean TNA-  7. C diff - s/p po Vanc - on flagyl with ABX 8. Anemia  1. aranesp to 200 qThurs 2. Finished Fe load 5/21 3. Transfused 2u PRBC 08/27/14 9. MBD - iPTH 130 5/12, low P resolved  10. Parox SVT on IV MTP q 4hrs.   11. 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 Vitamin C deficiency - getting 1000 mg IV daily w TNA, repeat level ordered for Clydia Llano MD  08/29/2014, 7:46 AM   Subjective:    No new events, for HD today   Objective Filed Vitals:   08/28/14 2100 08/28/14 2200 08/29/14 0017 08/29/14 0353  BP: 115/64 107/76 97/57 100/60  Pulse: 118  133 104  Temp:   98 F (36.7 C) 98 F (36.7 C)  TempSrc:   Oral Oral  Resp: Height:      Weight:    95.7 kg (210 lb 15.7 oz)  SpO2: 100%  97% 97%   Physical Exam General: Walking with PT Heart:tachy reg Lungs:clear to A/P now Abdomen: obese, anasarca with dependent back/buttock edema Extremities: 1-2+ dependent edema Dialysis Access: right IJ   Dialysis  Orders:  Additional Objective Labs: Basic Metabolic Panel:  Recent Labs Lab 08/25/14 0747 08/27/14 0539 08/29/14 0520  NA 139 132* 140  K 4.5 3.5 3.5  CL 100* 96* 102  CO2 GLUCOSE 90 457* 104*  BUN 34* 34* 32*  CREATININE 5.05* 4.56* 4.48*  CALCIUM 8.1* 7.8* 8.3*  PHOS 6.1* 4.4 4.4   Liver Function Tests:  Recent Labs Lab 08/25/14 0525 08/25/14 0747 08/27/14 0539 08/29/14 0520  AST 17  --  17  --   ALT 9*  --  8*  --   ALKPHOS 127*  --  108  --   BILITOT 1.3*  --  1.0  --   PROT 6.7  --  6.6  --   ALBUMIN 2.0* 2.1* 1.9* 1.9*  CBC:  Recent Labs Lab 08/24/14 0520 08/25/14 0525 08/26/14 0648 08/27/14 0539 08/28/14 2025 08/29/14 0520  WBC 15.7* 13.6* 13.2* 13.9* 15.7* 14.3*  NEUTROABS 12.5*  --   --   --   --   --   HGB 7.6* 7.9* 7.1* 6.8* 9.7* 9.1*  HCT 25.0* 25.6* 24.1* 24.7* 31.0* 29.3*  MCV 96.2 95.9 97.2 103.8* 94.5 94.8  PLT 309 357 327 323 336 303   Blood Culture    Component Value Date/Time   SDES  ABSCESS PELVIS 08/17/2014 1451   SPECREQUEST NONE 08/17/2014 1451   CULT  08/17/2014 1451    NO GROWTH 3 DAYS Performed at [facility]    REPTSTATUS 08/21/2014 FINAL 08/17/2014 1451    Cardiac Enzymes:  Recent Labs Lab 08/23/14 1944 08/24/14 0221 08/24/14 0630  TROPONINI 0.04* 0.03 0.03   CBG:  Recent Labs Lab 08/24/14 1631 08/25/14 0110 08/25/14 1741 08/27/14 1218 08/27/14 1817  GLUCAP 139* 122* 124* 112* 157*    Medications: . sodium chloride 10 mL/hr at 08/19/14 0950   . sodium chloride   Intravenous Once  . antiseptic oral rinse  7 mL Mouth Rinse q12n4p  . budesonide (PULMICORT) nebulizer solution  0.25 mg Nebulization BID  . cefTAZidime (FORTAZ)  IV  2 g Intravenous Q T,Th,Sa-HD  . chlorhexidine  15 mL Mouth Rinse BID  . darbepoetin (ARANESP) injection - DIALYSIS  200 mcg Intravenous Q Thu-HD  . feeding supplement (VITAL AF 1.2 CAL)  1,000 mL Per Tube Q24H  . ipratropium  0.5 mg Nebulization TID  .  levalbuterol  1.25 mg Nebulization TID  . levothyroxine  12.5 mcg Intravenous Daily  . metoprolol  2.5 mg Intravenous 4 times per day  . metronidazole  500 mg Intravenous Q8H  . sodium chloride  10-40 mL Intracatheter Q12H  . sodium hypochlorite   Topical BID  . vitamin C  1,000 mg Per Tube Daily

## 2014-08-29 NOTE — Progress Notes (Addendum)
PROGRESS NOTE  [name] UJW:[id number] DOB: Dec 09, 1955 DOA: 06/17/2014 PCP: [name], MD   Brief Narrative: 59 yo ? smoker with hx HTN, COPD, CKD IV followed @ [facility]-[name] Page, failed L AV fistula, initially admitted 3/16 with flu and acute hypoxic resp failure. Ultimately tx to [facility] due to need for HD. L sided weakness 3/18 and found to have R ACA stroke-showed acute infarct R ACA (3/19). Patient more lethargic 3/30 and CT abd=perf viscus 3/30. She was taken to the OR for ex lap for perforated pyloric ulcer with diffuse peritonitis, ischemic necrosis of mid ileum. Patient developed septic shock on 4-1, she was started on pressors and wean off pressors on 4-3. Subsequently, she was diagnosed with pelvic abscess by CT scan perform on 4-7. She underwent pelvic drain placement by IR on 4/8= culture neg 08/15/14 another drained placed in the pelvic cul-de-sac abscess with culture = pseudomonas. On 08/20/14, Gluteal drain was inadvertently pulled out. Repeat CT scan shows improvement in the fluid collection so decision was to leave it out and continue IV abx. Pt hospital stay also complicated by high output duodenal (enterocutaneous fistula) fistula. As a result, pt was made npo and started on TPN. Now stable, on TNA + very high output from fistula/leak per [facility]. Had a G_J tube placed by Dr. [name] of IR and Gen surgery has continued to assist with management of her multiple surgical comorbidities C. diff diagnosed 4-12-initially unresponsive to flagyl, and she was started on PO vancomycin 4-16.  06/24/14 Develops run of SVT, atrial tachycardia. Cardiology was helping with management. Course complicated with high RV pressure and Cor Pulmonale; 4/27 CT angio negative for PE. On 5/2 she developed hypoxemia, hypotension and SVT during dialysis treatment. Dialysis was stopped. She received IV bolus. She was started on BIPAP. She was transferred to Step down unit. PCCM and cardiology re  consulted. Her Hr decreased, BP improved. She was taken off BIPAP. This recurred on 5/15 with episodic SVT. Since then she has had intermitten short episodes of SVT, but in past 24 hours she has had more sustained SVT, therefore; cardiology was reconsulted on on 08/29/14.  Assessment/Plan: Status post exploratory laparotomy with closure of perforated pyloric ulcer/abdominal abscess / small bowel resection w/ postop ileus and now with enterocutaneous fistula. Pelvic abscess s/p percutaneous drain placement 4/8; had leakage from anastomosis site. Received Zosyn for 23 days. Chronic low grade leukocytosis range 13-15 -IV antibiotics were discontinued by Surgery on 4/22. Also received 18 days of Vancomycin and fluconazole.  -Due to Fistula, pt is back to NPO state, TPN was started -Fistula study done shows JP drain connection to the duodenal bulb-5-02. - 08/15/2014 repeat CT abdomen with 3.0 x 3.5 x 5.7 cm collection in the pelvic cul-de-sac and extraluminal air alongside JP drain in anterior abd wall -Drain with old and new blood, Hb trending down, transfuse 2 unit PRBC 5/10 -continues to have high output from Fistula/leak. Had G_J placed by IR 08/12/14 at request of gen surgery -CT repeat shows 5 cm post cul-de-sac abscess which was drained on 5/16-culture revealed pseudomonas aeruginosa -ID was consulted as patient had Pseudomonas growing from her culture wounds and they started back Flagyl as well as Elita Quick on 5/16 and are following and we await final growth from her new abscess drained on 5/16. -Per ID recommendation--plan D#12/21 daysFortaz and D#12/ 28 days metronidazole -Patient inadvertently pulled out her gluteal/cul-de-sac drain May 19. Seen by interventional  radiology and they recommended repeat CT scan, which was done. Shows that the fluid collection has decreased in size. They do not plan to replace the drain at this time. Continue with antibiotics alone.  -08/21/2014 CT scan did suggest a  transmural defect in the stomach wall near the patient's pyloric Cheree Ditto patch. We will let surgery address this. -Patient accidentally pulled out her JP drain on 5/22. This has been replaced by surgery on 5/23. -Octreotide was discontinued 5/23--enterocutaneous fistula output appears to be decreasing with 1000 mL in the past 24 hours - Continue with tube feedings. Seems to be tolerating. No significant residuals  -finished TPN on 08/27/14 -Enteral Feeding was increased 5/27 to 55 mL per hour--tolerating without residuals -08/29/14--started clear liquids  PSVT --?MAT -Felt to be secondary to severe anxiety with dialysis as well as multiple co-morbidities and COPD/Cor Pulmonale  -in past 24 hours--more sustained  -IV metoprolol use limited by pt's soft BP -08/29/14--reconsulted cardiology--spoke with Nada Boozer -V-Q scan with intermediate probability for pulmonary embolism.  -CTA 4/27, negative for PE  -Chest pain on 5/22, likely related to the fact that JP drain was accidentally pulled out, which resulted in severe abdominal pain. Chest x-ray did not show any concerning findings. EKG was nonischemic. Troponins are unremarkable. Don't anticipate further workup unless she has recurrence of her symptoms. -Unfortunately, the patient's soft blood pressures have limited use of intravenous metoprolol  ESRD new, now on HD this admit complicated by intermittent hypotension AKI on CKD 4, due to sepsis, shock Ongoing hemodialysis per Nephrology - perm-cath placed 3/26 d/t AVF problems Patient AV fistula reviewed and not mature yet per Dr. Paulene Floor need to revisit prior to d/c -TPN weaned off 08/27/14 -Continue hemodialysis Tuesday, Thursday, Saturday while the patient is in the hospital  C. difficile colitis still on contact precautions -C diff positive on 4-12, Received 4 days of flagyl without improvement, then started on PO vanc -Completed 3 weeks of Oral Vanc-stopped 5/7, this was 1 week  after IV Zosyn was stopped 5/5 -Diarrhea resolved--stools are firm  -keep on contact precautions  ACA CVA with Lt sided weakness -06/23/14--TEE ;normal LV function; no LAA thrombus -Started on ASA 81mg  per Neuro -Will need ongoing long term rehab - PT/OT to cont to follow while inpatient -Patient remains very weak and deconditioned. 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 Fluids/volume managed with hemodialysis.  Elevated PA pressures on 2-D echo; CTA negative for PE - 07/23/2014 echo-- EF 60-61%, grade 1 diastolic dysfunction, Severe RV dilatation, PAP 90  Small PFO Has been evaluated by Cardiology. No intervention planned currently.  Anemia of critical illness/chronic disease -and bleeding from leak/fistula which has resolved. -s/p 2U PRBC 4/13 and 5/10 and 5/26 Aranesp/iron per renal -transfuse for hemoglobin less than 7  Acute hypoxic Respiratory Failure:  -This was secondary to H1N1, hypervolemia, HCAP >> resolved.  -also has COPD and Cor Pulmonale - patient complained of some increased shortness of breath today-- wheezing on exam - start scheduled Xopenex and Atrovent - presently stable on 2 L-3L Thrombocytopenia; resolved.  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 On TPN now, CBGs stabilizedin 120-160s. Hba1c  6.5 -08/27/14--question accuracy of CBG of 457 on BMP this am--likely contaminated with TPN  Abnormal TSH -Free T4 WNL, T3 1.8 -most likely sick thyroid with ongoing infection and body stress; also due to amiodarone use. TSH in 7 range -continue low dose synthroid given PSVT, now on IV synthroid   L Renal mass  -noted on [place], Indeterminate 1.9 cm hypoechoic mass off the interpolar region of the left kidney -may potentially represent a cyst however solid mass is not excluded -needs outpatient FU for this  Obesity Inadequate oral intake related to altered GI function as evidenced by limited intake, ongoing.  Body mass index is 35.03 kg/(m^2).  Fall on May 18 Examination did not reveal any injuries.   DVT prophylaxis: SCDs Code Status: FULL Family Communication: Discussed with patient. No family at bedside. Disposition Plan: Have been unable to transfer patient out of stepdown unit due to dyspnea and tachycardia. She also has complex wound care needs. Dialysis is ongoing. Not ready for discharge.  Consultants: Cardiology Nephrology PCCM Gen Surgery  Palliative care      Procedures/Studies: Ct Abdomen Pelvis Wo Contrast  08/22/2014   CLINICAL DATA:  Abdominal abscess. No intravenous contrast due to renal failure.  EXAM: CT ABDOMEN AND PELVIS WITHOUT CONTRAST  TECHNIQUE: Multidetector CT imaging of the abdomen and pelvis was performed following the standard protocol without IV contrast.  COMPARISON:  08/15/2014  FINDINGS: BODY WALL: Open laparotomy without fluid collection.  LOWER CHEST: Chronic triangular opacity in the posterior costophrenic sulcus on the left, atelectasis versus scarring  ABDOMEN/PELVIS:  Liver: No focal abnormality.  Biliary: High-density material within the gallbladder showing mild wall thickening which is chronic.  Pancreas: Unremarkable.  Spleen: Unremarkable.  Adrenals: Unremarkable.  Kidneys and ureters: No hydronephrosis or stone. Presumed 1 cm cyst from  the interpolar left kidney  Bladder: Decompressed.  No pathologic findings.  Reproductive: Calcified and noncalcified uterine fibroids, better seen on previous enhanced imaging.  Bowel: Right upper quadrant drain related to perforated pyloric ulcer status post Cheree Ditto patch. Specially visible on coronal imaging, there is a gas channel through the pylorus measuring 6 to 9 mm in diameter. No bowel obstruction. Enteroenterostomy noted in the pelvis. Oral contrast again seen in the colon. No notable colonic wall thickening in this patient with history of C difficile. Negative appendix. Percutaneous gastrojejunostomy tube is in good position.  Retroperitoneum: No mass or adenopathy.  Peritoneum: Known thick walled collection in the rectovaginal recess smaller than 08/15/2014 at 40 x 16 mm as compared to 51 x 32 mm. No new collection is identified. No free pneumoperitoneum.  Vascular: 3 cm fusiform infrarenal aortic aneurysm.  OSSEOUS: No acute abnormalities.  IMPRESSION: 1. Near the patient's pyloric Cheree Ditto patch is a transmural defect in the stomach wall, contiguous with the surgical drain. If repeat surgery is considered, oral contrast could confirm an open ulcer. 2. Decreased rectovaginal recess fluid collection, now 4 x 1.5 cm (previously 5 x 3 cm).   Electronically Signed   By: Marnee Spring M.D.   On: 08/22/2014 01:49   Ct Abdomen Pelvis Wo Contrast  08/15/2014   CLINICAL DATA:  Intra abdominal abscess.  EXAM: CT ABDOMEN AND PELVIS WITHOUT CONTRAST  TECHNIQUE: Multidetector CT imaging of the abdomen and pelvis was performed following the standard protocol without IV contrast.  COMPARISON:  CT scan of Aug 03, 2014.  FINDINGS: Severe degenerative disc disease is noted at L5-S1. Minimal subsegmental atelectasis is noted posteriorly in the left lung base.  No gallstones are noted. No focal abnormality is noted in the liver, spleen or pancreas on these unenhanced images. Adrenal glands appear normal. Stable exophytic  cyst is seen arising from midpole of left kidney. No hydronephrosis or renal obstruction is noted. No renal or ureteral calculi are noted. 3 cm infrarenal abdominal aortic aneurysm is noted. Gastrojejunostomy tube is seen entering stomach percutaneously and with distal tip in the jejunum. JP surgical drain is seen entering right upper quadrant of abdomen underneath the liver with distal tip in the left upper quadrant. No significant fluid collection is noted around the drain. Fluid collection is again identified and posterior cul-de-sac of pelvis measuring 5.1 x 3.2 cm. Calcified degenerating fibroid is noted in the uterus. Urinary bladder is unremarkable. There is no evidence of bowel obstruction. The appendix appears normal. No significant adenopathy is noted. Large midline surgical anterior abdominal wall incision is noted.  IMPRESSION: Stable 3 cm infrarenal abdominal aortic aneurysm.  No change in position of JP surgical drain with distal tip in the left upper quadrant of the abdomen.  Interval placement of percutaneous gastrojejunostomy tube with distal tip in the jejunum.  Grossly stable size and appearance of 5 cm fluid collection seen in posterior cul-de-sac of the pelvis.  Continued presence uterine fibroids.   Electronically Signed   By: Lupita Raider, M.D.   On: 08/15/2014 15:15   Dg Chest 1 View  08/13/2014   CLINICAL DATA:  Shortness of breath  EXAM: CHEST  1 VIEW  COMPARISON:  08/03/2014  FINDINGS: Moderately severe cardiac enlargement. Central line in unchanged position on the right and left side. Mild vascular congestion with no evidence of edema effusion or consolidation.  IMPRESSION: No acute findings   Electronically Signed   By: Esperanza Heir M.D.   On: 08/13/2014 11:04   Dg Abd 1 View  08/03/2014   CLINICAL DATA:  NG tube placement  EXAM: ABDOMEN - 1 VIEW  COMPARISON:  07/13/2014  FINDINGS: NG tube tip is in the proximal stomach. The side port is near the GE junction.  IMPRESSION: NG  tube tip in the proximal stomach.   Electronically Signed   By: Charlett Nose M.D.   On: 08/03/2014 17:18   Ct Abdomen Pelvis W Contrast  08/04/2014   CLINICAL DATA:  New epigastric abdominal pain.  EXAM: CT ABDOMEN AND PELVIS WITH CONTRAST  TECHNIQUE: Multidetector CT imaging of the abdomen and pelvis was performed using the standard protocol following bolus administration of intravenous contrast.  CONTRAST:  OMNIPAQUE IOHEXOL 300 MG/ML  SOLN  COMPARISON:  07/16/2014  FINDINGS: There is a small volume extraluminal air alongside the JP drain in the anterior abdominal midline. Source of the air is not clear. No other extraluminal air is evident in the abdomen or pelvis. No undrained fluid collection is evident alongside the JP drain. There is a 3.5 x 5.7 x 3.0 cm contained collection in the pelvic cul-de-sac which could represent a small abscess.  There is an open midline abdominal wound without associated drainable collection. There is herniation of abdominal fat through the abdominal wall musculature at the location of the wound.  There are normal appearances of the liver, spleen, pancreas, adrenals and kidneys with the exception of a 1.8 cm simple left renal cyst. There is an unchanged 3 cm infrarenal  abdominal aortic aneurysm. The duodenum is draped over the aneurysm but appears intact.  There are multiple uterine fibroids.  There is no significant abnormality in the lower chest.  IMPRESSION: *Extraluminal air alongside the JP drain in the anterior abdominal midline, etiology not apparent on this scan *3.0 x 3.5 x 5.7 cm collection in the pelvic cul-de-sac *Unchanged 3 cm infrarenal abdominal aortic aneurysm directly adjacent to the duodenum *Open wound in the abdominal midline. No fluid collection near the wound.   Electronically Signed   By: Ellery Plunkaniel R Mitchell M.D.   On: 08/04/2014 02:24   Ir Gastrostomy Tube Mod Sed  08/12/2014   CLINICAL DATA:  Perforated duodenal ulcer, post g patch with persistent  leak. Percutaneous gastrojejunostomy as requested for gastric decompression and small bowel enteral feeding support.  EXAM: PERC PLACEMENT GASTROSTOMY;  CONVERT G-TUBE TO G-JTUBE  FLUOROSCOPY TIME:  16 minutes 24 seconds, 1016.6 mGy  TECHNIQUE: The procedure, risks, benefits, and alternatives were explained to the patient. Questions regarding the procedure were encouraged and answered. The patient understands and consents to the procedure. As antibiotic prophylaxis, cefazolin 2 g was ordered pre-procedure and administered intravenously within one hour of incision. . A 5 French angiographic catheter was placed as orogastric tube. The upper abdomen was prepped with Betadine, draped in usual sterile fashion, and infiltrated locally with 1% lidocaine. 1 mg glucagon was administered intravenously to facilitate gastric distention and slow peristalsis. Stomach was insufflated using air through the orogastric tube. An 3518 French sheath needle was advanced percutaneously into the gastric lumen under fluoroscopy. Gas could be aspirated and a small contrast injection confirmed intraluminal spread. The sheath was exchanged over a guidewire for a 9 [place] vascular sheath, through which the snare device was advanced and used to snare a guidewire passed through the orogastric tube. This was withdrawn, and the snare attached to the 20 French pull-through gastrostomy tube, which was advanced antegrade, positioned with the internal bumper securing the anterior gastric wall to the anterior abdominal wall. Small contrast injection confirms appropriate positioning. The external bumper was applied and the catheter was flushed.  A 5 French angiographic catheter was placed coaxially through the gastrostomy tube and used to direct an angled stiff glidewire across the pylorus, beyond the proximal duodenal perforation, and into the proximal jejunum just beyond the ligament of Treitz. Over this, a coaxial jejunostomy feeding adapter was  advanced. Contrast injection confirmed patency and appropriate position of the gastric and jejunal lumens. No extravasation. The lumens were flushed with saline and capped. The patient tolerated the procedure well.  COMPLICATIONS: COMPLICATIONS none  IMPRESSION: 1. Technically successful 20 French pull-through gastrostomy placement under fluoroscopy. 2. Technically successful coaxial jejunostomy feeding adapter placement to the ligament of Treitz.   Electronically Signed   By: Corlis Leak  Hassell M.D.   On: 08/12/2014 16:12   Ir Adele SchilderGastr Tamsen Sniderube Convert Gastr-jej Per W/fl Mod Sed  08/12/2014   CLINICAL DATA:  Perforated duodenal ulcer, post g patch with persistent leak. Percutaneous gastrojejunostomy as requested for gastric decompression and small bowel enteral feeding support.  EXAM: PERC PLACEMENT GASTROSTOMY;  CONVERT G-TUBE TO G-JTUBE  FLUOROSCOPY TIME:  16 minutes 24 seconds, 1016.6 mGy  TECHNIQUE: The procedure, risks, benefits, and alternatives were explained to the patient. Questions regarding the procedure were encouraged and answered. The patient understands and consents to the procedure. As antibiotic prophylaxis, cefazolin 2 g was ordered pre-procedure and administered intravenously within one hour of incision. . A 5 French angiographic catheter was placed as orogastric tube.  The upper abdomen was prepped with Betadine, draped in usual sterile fashion, and infiltrated locally with 1% lidocaine. 1 mg glucagon was administered intravenously to facilitate gastric distention and slow peristalsis. Stomach was insufflated using air through the orogastric tube. An 68 French sheath needle was advanced percutaneously into the gastric lumen under fluoroscopy. Gas could be aspirated and a small contrast injection confirmed intraluminal spread. The sheath was exchanged over a guidewire for a 9 [place] vascular sheath, through which the snare device was advanced and used to snare a guidewire passed through the orogastric tube.  This was withdrawn, and the snare attached to the 20 French pull-through gastrostomy tube, which was advanced antegrade, positioned with the internal bumper securing the anterior gastric wall to the anterior abdominal wall. Small contrast injection confirms appropriate positioning. The external bumper was applied and the catheter was flushed.  A 5 French angiographic catheter was placed coaxially through the gastrostomy tube and used to direct an angled stiff glidewire across the pylorus, beyond the proximal duodenal perforation, and into the proximal jejunum just beyond the ligament of Treitz. Over this, a coaxial jejunostomy feeding adapter was advanced. Contrast injection confirmed patency and appropriate position of the gastric and jejunal lumens. No extravasation. The lumens were flushed with saline and capped. The patient tolerated the procedure well.  COMPLICATIONS: COMPLICATIONS none  IMPRESSION: 1. Technically successful 20 French pull-through gastrostomy placement under fluoroscopy. 2. Technically successful coaxial jejunostomy feeding adapter placement to the ligament of Treitz.   Electronically Signed   By: Corlis Leak M.D.   On: 08/12/2014 16:12   Dg Chest Port 1 View  08/23/2014   CLINICAL DATA:  Left chest pain  EXAM: PORTABLE CHEST - 1 VIEW  COMPARISON:  08/18/2014  FINDINGS: Patient is rotated.  Lungs are essentially clear. No focal consolidation. No pleural effusion or pneumothorax.  Cardiomegaly.  Left IJ venous catheter terminates at the cavoatrial junction. Right IJ dual lumen dialysis catheter terminates in the lower SVC.  IMPRESSION: No evidence of acute cardiopulmonary disease.   Electronically Signed   By: Charline Bills M.D.   On: 08/23/2014 19:17   Dg Chest Port 1 View  08/18/2014   CLINICAL DATA:  Shortness of breath at rest.  Having hemodialysis.  EXAM: PORTABLE CHEST - 1 VIEW  COMPARISON:  08/15/2014.  FINDINGS: The cardiac silhouette remains mildly enlarged. Stable right  jugular double-lumen catheter. Clear lungs. The pulmonary vasculature remains prominent. No pleural fluid. Minimal right shoulder degenerative changes.  IMPRESSION: Stable cardiomegaly and pulmonary vascular congestion.   Electronically Signed   By: Beckie Salts M.D.   On: 08/18/2014 09:47   Dg Chest Port 1 View  08/15/2014   CLINICAL DATA:  Shortness of breath.  EXAM: PORTABLE CHEST - 1 VIEW  COMPARISON:  08/13/2014 and multiple prior chest radiographs  FINDINGS: Cardiomegaly and pulmonary vascular congestion again noted.  A right IJ central venous catheter is noted with tips overlying the upper and mid SVC.  A left central venous catheter is present with tip overlying the lower SVC.  There is no evidence of focal airspace disease, pulmonary edema, suspicious pulmonary nodule/mass, pleural effusion, or pneumothorax. No acute bony abnormalities are identified.  IMPRESSION: Cardiomegaly with pulmonary vascular congestion.   Electronically Signed   By: Harmon Pier M.D.   On: 08/15/2014 17:28   Dg Chest Port 1v Same Day  08/03/2014   CLINICAL DATA:  59 year old female dialysis patient with severe shortness of breath. Respiratory distress. Initial encounter.  EXAM: PORTABLE  CHEST - 1 VIEW SAME DAY  COMPARISON:  Chest CTA 07/29/2014 and earlier.  FINDINGS: Portable AP semi upright view at 1400 hrs. Bilateral IJ approach chest catheters appears stable. Stable lung volumes. Stable cardiomegaly and mediastinal contours. No pneumothorax, pulmonary edema, pleural effusion or consolidation. No acute pulmonary opacity identified.  IMPRESSION: No acute cardiopulmonary abnormality.   Electronically Signed   By: Odessa Fleming M.D.   On: 08/03/2014 14:18   Dg Abd Portable 1v  08/23/2014   CLINICAL DATA:  JP drain, broken, initial encounter  EXAM: PORTABLE ABDOMEN - 1 VIEW  COMPARISON:  CT abdomen pelvis dated 08/21/2014  FINDINGS: A portion of the right flank is excluded from this single image.  The patient's right abdominal  surgical drain is not visualized.  Gastrojejunostomy in satisfactory position.  IMPRESSION: The patient's right abdominal surgical drain is not visualized.  Gastrojejunostomy in satisfactory position.   Electronically Signed   By: Charline Bills M.D.   On: 08/23/2014 19:11   Ct Image Guided Fluid Drain By Catheter  08/17/2014   CLINICAL DATA:  Postoperative intra-abdominal abscess, subsequent encounter. Small pelvic fluid collection.  EXAM: CT-GUIDED DRAIN PLACEMENT IN PELVIC FLUID COLLECTION  Physician: Rachelle Hora. Lowella Dandy, MD  FLUOROSCOPY TIME:  None  MEDICATIONS: 50 mcg fentanyl  ANESTHESIA/SEDATION: Patient was monitored by a radiology nurse during the procedure.  PROCEDURE: Informed consent was obtained for drain placement. Patient was placed on her left side. Images through the pelvis were obtained. The right buttock was prepped and draped in sterile fashion. 1% lidocaine was used for a local anesthetic. An 18 gauge needle was directed into the pelvic fluid collection with CT guidance from a transgluteal approach. Thick yellow purulent fluid was aspirated. A stiff Amplatz wire was placed. The tract was dilated to accommodate a 10.2 [place] multipurpose drain. It was technically difficult to advance the drain into the collection due to the small size of the collection and the patient's body habitus. Eventually, the drain was successfully placed within the collection. 5 mL of yellow purulent fluid was obtained. Catheter was sutured to the skin and attached to a suction bulb. Fluid was sent for culture.  FINDINGS: Small fluid collection just posterior to the uterus. A total of 5 mL of yellow purulent fluid was removed.  Estimated blood loss: Minimal  COMPLICATIONS: None  IMPRESSION: CT-guided placement of a drainage catheter in the small pelvic fluid collection.   Electronically Signed   By: Richarda Overlie M.D.   On: 08/17/2014 17:21         Subjective: Patient in good spirits now that she was advanced to  clear liquid diet today. States that abdominal pain is unchanged. Denies any nausea, vomiting, diarrhea, chest pain, shortness of breath. She has some dyspnea on exertion which is not changed. Denies headache, fevers, chills  Objective: Filed Vitals:   08/29/14 0353 08/29/14 0700 08/29/14 0800 08/29/14 1155  BP: 100/60 109/89 93/56 116/92  Pulse: 104 99 112 140  Temp: 98 F (36.7 C)  98.1 F (36.7 C) 98.2 F (36.8 C)  TempSrc: Oral  Oral Oral  Resp: 17 19 24 19   Height:      Weight: 95.7 kg (210 lb 15.7 oz)     SpO2: 97% 97% 97% 98%    Intake/Output Summary (Last 24 hours) at 08/29/14 1354 Last data filed at 08/29/14 1139  Gross per 24 hour  Intake 2700.66 ml  Output   1975 ml  Net 725.66 ml   Weight change: 5  kg (11 lb 0.4 oz) Exam:   General:  Pt is alert, follows commands appropriately, not in acute distress  HEENT: No icterus, No thrush, [place]/AT  Cardiovascular: RRR, S1/S2, no rubs, no gallops  Respiratory: Scattered bibasilar rales. Mild basilar wheezing. Good air movement.  Abdomen: Soft/+BS, non tender, non distended, no guarding  Extremities: trace LE edema, No lymphangitis, No petechiae, No rashes, no synovitis  Data Reviewed: Basic Metabolic Panel:  Recent Labs Lab 08/23/14 0415 08/24/14 0520 08/25/14 0525 08/25/14 0747 08/27/14 0539 08/29/14 0520  NA 137  --  138 139 132* 140  K 4.3  --  4.5 4.5 3.5 3.5  CL 99*  --  101 100* 96* 102  CO2 30  --  GLUCOSE 120*  --  83 90 457* 104*  BUN 14  --  34* 34* 34* 32*  CREATININE 2.43*  --  5.10* 5.05* 4.56* 4.48*  CALCIUM 8.3*  --  8.3* 8.1* 7.8* 8.3*  MG 2.0 2.0  --   --  1.7  --   PHOS 3.3 5.0*  --  6.1* 4.4 4.4   Liver Function Tests:  Recent Labs Lab 08/25/14 0525 08/25/14 0747 08/27/14 0539 08/29/14 0520  AST 17  --  17  --   ALT 9*  --  8*  --   ALKPHOS 127*  --  108  --   BILITOT 1.3*  --  1.0  --   PROT 6.7  --  6.6  --   ALBUMIN 2.0* 2.1* 1.9* 1.9*   No results for  input(s): LIPASE, AMYLASE in the last 168 hours. No results for input(s): AMMONIA in the last 168 hours. CBC:  Recent Labs Lab 08/24/14 0520 08/25/14 0525 08/26/14 0648 08/27/14 0539 08/28/14 2025 08/29/14 0520  WBC 15.7* 13.6* 13.2* 13.9* 15.7* 14.3*  NEUTROABS 12.5*  --   --   --   --   --   HGB 7.6* 7.9* 7.1* 6.8* 9.7* 9.1*  HCT 25.0* 25.6* 24.1* 24.7* 31.0* 29.3*  MCV 96.2 95.9 97.2 103.8* 94.5 94.8  PLT 309 357 327 323 336 303   Cardiac Enzymes:  Recent Labs Lab 08/23/14 1944 08/24/14 0221 08/24/14 0630  TROPONINI 0.04* 0.03 0.03   BNP: Invalid input(s): POCBNP CBG:  Recent Labs Lab 08/24/14 1631 08/25/14 0110 08/25/14 1741 08/27/14 1218 08/27/14 1817  GLUCAP 139* 122* 124* 112* 157*    No results found for this or any previous visit (from the past 240 hour(s)).   Scheduled Meds: . sodium chloride   Intravenous Once  . antiseptic oral rinse  7 mL Mouth Rinse q12n4p  . budesonide (PULMICORT) nebulizer solution  0.25 mg Nebulization BID  . cefTAZidime (FORTAZ)  IV  2 g Intravenous Q T,Th,Sa-HD  . chlorhexidine  15 mL Mouth Rinse BID  . darbepoetin (ARANESP) injection - DIALYSIS  200 mcg Intravenous Q Thu-HD  . feeding supplement (VITAL AF 1.2 CAL)  1,000 mL Per Tube Q24H  . ipratropium  0.5 mg Nebulization TID  . levalbuterol  1.25 mg Nebulization TID  . levothyroxine  12.5 mcg Intravenous Daily  . metoprolol  2.5 mg Intravenous 4 times per day  . metronidazole  500 mg Intravenous Q8H  . sodium chloride  10-40 mL Intracatheter Q12H  . sodium hypochlorite   Topical BID  . vitamin C  1,000 mg Per Tube Daily   Continuous Infusions: . sodium chloride 10 mL/hr at 08/19/14 0950     Denelle Capurro, DO  [facility] Pager [phone]  If 7PM-7AM, please contact night-coverage [email or web address] Password [email or web address] 08/29/2014, 1:54 PM   LOS: 73 days

## 2014-08-29 NOTE — Progress Notes (Signed)
Dr. Arbutus Leasat made aware of pt SBP in the low 90's and HR in the 140's.  MD stated he will give Cardiology a call.

## 2014-08-29 NOTE — Progress Notes (Signed)
SUBJECTIVE: The patient has a long and complex history. She has been hospitalized since March, 2016. She has had supraventricular tachycardia. 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 Beta blockade helps some. Currently her blood pressure is on the low side and there is hesitation to give her beta blocker. The patient had a CVA during this admission. She RV dysfunction with high right heart pressures and shunting from the right to left. There has been no documented atrial fibrillation. It has been felt that amiodarone is the only drug that will definitely help with her rhythm. She was on amiodarone for a period of time. This included oral amiodarone. She could no longer take oral meds after while in the amiodarone was stopped. She has been seen by the cardiology team intermittently since March, 2016. She was seen earlier in May.  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   Filed Vitals:   08/29/14 0353 08/29/14 0700 08/29/14 0800 08/29/14 1155  BP: 100/60 109/89 93/56 116/92  Pulse: 104 99 112 140  Temp: 98 F (36.7 C)  98.1 F (36.7 C) 98.2 F (36.8 C)  TempSrc: Oral  Oral Oral  Resp: Height:      Weight: 210 lb 15.7 oz (95.7 kg)     SpO2: 97% 97% 97% 98%     Intake/Output Summary (Last 24 hours) at 08/29/14 1546 Last data filed at 08/29/14 1139  Gross per 24 hour  Intake 2625.66 ml  Output   1175 ml  Net 1450.66 ml    LABS: Basic Metabolic Panel:  Recent Labs  16/10/96 0539 08/29/14 0520  NA 132* 140  K 3.5 3.5  CL 96* 102  CO2 27 28  GLUCOSE 457* 104*  BUN 34* 32*  CREATININE 4.56* 4.48*  CALCIUM 7.8* 8.3*  MG 1.7  --   PHOS 4.4 4.4   Liver Function Tests:  Recent Labs  08/27/14 0539 08/29/14 0520  AST 17  --   ALT 8*  --   ALKPHOS 108  --   BILITOT 1.0  --   PROT 6.6  --   ALBUMIN 1.9* 1.9*   No results for input(s): LIPASE,  AMYLASE in the last 72 hours. CBC:  Recent Labs  08/28/14 2025 08/29/14 0520  WBC 15.7* 14.3*  HGB 9.7* 9.1*  HCT 31.0* 29.3*  MCV 94.5 94.8  PLT 336 303   Cardiac Enzymes: No results for input(s): CKTOTAL, CKMB, CKMBINDEX, TROPONINI in the last 72 hours. BNP: Invalid input(s): POCBNP D-Dimer: No results for input(s): DDIMER in the last 72 hours. Hemoglobin A1C: No results for input(s): HGBA1C in the last 72 hours. Fasting Lipid Panel: No results for input(s): CHOL, HDL, LDLCALC, TRIG, CHOLHDL, LDLDIRECT in the last 72 hours. Thyroid Function Tests: No results for input(s): TSH, T4TOTAL, T3FREE, THYROIDAB in the last 72 hours.  Invalid input(s): FREET3  RADIOLOGY: Ct Abdomen Pelvis Wo Contrast  08/22/2014   CLINICAL DATA:  Abdominal abscess. No intravenous contrast due to renal failure.  EXAM: CT ABDOMEN AND PELVIS WITHOUT CONTRAST  TECHNIQUE: Multidetector CT imaging of the abdomen and pelvis was performed following the standard protocol without IV contrast.  COMPARISON:  08/15/2014  FINDINGS: BODY WALL: Open laparotomy without fluid collection.  LOWER CHEST: Chronic triangular opacity in the posterior costophrenic sulcus on the left, atelectasis versus scarring  ABDOMEN/PELVIS:  Liver: No focal abnormality.  Biliary: High-density material within the  gallbladder showing mild wall thickening which is chronic.  Pancreas: Unremarkable.  Spleen: Unremarkable.  Adrenals: Unremarkable.  Kidneys and ureters: No hydronephrosis or stone. Presumed 1 cm cyst from the interpolar left kidney  Bladder: Decompressed.  No pathologic findings.  Reproductive: Calcified and noncalcified uterine fibroids, better seen on previous enhanced imaging.  Bowel: Right upper quadrant drain related to perforated pyloric ulcer status post Cheree Ditto patch. Specially visible on coronal imaging, there is a gas channel through the pylorus measuring 6 to 9 mm in diameter. No bowel obstruction. Enteroenterostomy noted in the  pelvis. Oral contrast again seen in the colon. No notable colonic wall thickening in this patient with history of C difficile. Negative appendix. Percutaneous gastrojejunostomy tube is in good position.  Retroperitoneum: No mass or adenopathy.  Peritoneum: Known thick walled collection in the rectovaginal recess smaller than 08/15/2014 at 40 x 16 mm as compared to 51 x 32 mm. No new collection is identified. No free pneumoperitoneum.  Vascular: 3 cm fusiform infrarenal aortic aneurysm.  OSSEOUS: No acute abnormalities.  IMPRESSION: 1. Near the patient's pyloric Cheree Ditto patch is a transmural defect in the stomach wall, contiguous with the surgical drain. If repeat surgery is considered, oral contrast could confirm an open ulcer. 2. Decreased rectovaginal recess fluid collection, now 4 x 1.5 cm (previously 5 x 3 cm).   Electronically Signed   By: Marnee Spring M.D.   On: 08/22/2014 01:49   Ct Abdomen Pelvis Wo Contrast  08/15/2014   CLINICAL DATA:  Intra abdominal abscess.  EXAM: CT ABDOMEN AND PELVIS WITHOUT CONTRAST  TECHNIQUE: Multidetector CT imaging of the abdomen and pelvis was performed following the standard protocol without IV contrast.  COMPARISON:  CT scan of Aug 03, 2014.  FINDINGS: Severe degenerative disc disease is noted at L5-S1. Minimal subsegmental atelectasis is noted posteriorly in the left lung base.  No gallstones are noted. No focal abnormality is noted in the liver, spleen or pancreas on these unenhanced images. Adrenal glands appear normal. Stable exophytic cyst is seen arising from midpole of left kidney. No hydronephrosis or renal obstruction is noted. No renal or ureteral calculi are noted. 3 cm infrarenal abdominal aortic aneurysm is noted. Gastrojejunostomy tube is seen entering stomach percutaneously and with distal tip in the jejunum. JP surgical drain is seen entering right upper quadrant of abdomen underneath the liver with distal tip in the left upper quadrant. No significant  fluid collection is noted around the drain. Fluid collection is again identified and posterior cul-de-sac of pelvis measuring 5.1 x 3.2 cm. Calcified degenerating fibroid is noted in the uterus. Urinary bladder is unremarkable. There is no evidence of bowel obstruction. The appendix appears normal. No significant adenopathy is noted. Large midline surgical anterior abdominal wall incision is noted.  IMPRESSION: Stable 3 cm infrarenal abdominal aortic aneurysm.  No change in position of JP surgical drain with distal tip in the left upper quadrant of the abdomen.  Interval placement of percutaneous gastrojejunostomy tube with distal tip in the jejunum.  Grossly stable size and appearance of 5 cm fluid collection seen in posterior cul-de-sac of the pelvis.  Continued presence uterine fibroids.   Electronically Signed   By: Lupita Raider, M.D.   On: 08/15/2014 15:15   Dg Chest 1 View  08/13/2014   CLINICAL DATA:  Shortness of breath  EXAM: CHEST  1 VIEW  COMPARISON:  08/03/2014  FINDINGS: Moderately severe cardiac enlargement. Central line in unchanged position on the right and left side. Mild  vascular congestion with no evidence of edema effusion or consolidation.  IMPRESSION: No acute findings   Electronically Signed   By: Esperanza Heiraymond  Rubner M.D.   On: 08/13/2014 11:04   Dg Abd 1 View  08/03/2014   CLINICAL DATA:  NG tube placement  EXAM: ABDOMEN - 1 VIEW  COMPARISON:  07/13/2014  FINDINGS: NG tube tip is in the proximal stomach. The side port is near the GE junction.  IMPRESSION: NG tube tip in the proximal stomach.   Electronically Signed   By: Charlett NoseKevin  Dover M.D.   On: 08/03/2014 17:18   Ct Abdomen Pelvis W Contrast  08/04/2014   CLINICAL DATA:  New epigastric abdominal pain.  EXAM: CT ABDOMEN AND PELVIS WITH CONTRAST  TECHNIQUE: Multidetector CT imaging of the abdomen and pelvis was performed using the standard protocol following bolus administration of intravenous contrast.  CONTRAST:  100mL OMNIPAQUE IOHEXOL  300 MG/ML  SOLN  COMPARISON:  07/16/2014  FINDINGS: There is a small volume extraluminal air alongside the JP drain in the anterior abdominal midline. Source of the air is not clear. No other extraluminal air is evident in the abdomen or pelvis. No undrained fluid collection is evident alongside the JP drain. There is a 3.5 x 5.7 x 3.0 cm contained collection in the pelvic cul-de-sac which could represent a small abscess.  There is an open midline abdominal wound without associated drainable collection. There is herniation of abdominal fat through the abdominal wall musculature at the location of the wound.  There are normal appearances of the liver, spleen, pancreas, adrenals and kidneys with the exception of a 1.8 cm simple left renal cyst. There is an unchanged 3 cm infrarenal abdominal aortic aneurysm. The duodenum is draped over the aneurysm but appears intact.  There are multiple uterine fibroids.  There is no significant abnormality in the lower chest.  IMPRESSION: *Extraluminal air alongside the JP drain in the anterior abdominal midline, etiology not apparent on this scan *3.0 x 3.5 x 5.7 cm collection in the pelvic cul-de-sac *Unchanged 3 cm infrarenal abdominal aortic aneurysm directly adjacent to the duodenum *Open wound in the abdominal midline. No fluid collection near the wound.   Electronically Signed   By: Ellery Plunkaniel R Mitchell M.D.   On: 08/04/2014 02:24   Ir Gastrostomy Tube Mod Sed  08/12/2014   CLINICAL DATA:  Perforated duodenal ulcer, post g patch with persistent leak. Percutaneous gastrojejunostomy as requested for gastric decompression and small bowel enteral feeding support.  EXAM: PERC PLACEMENT GASTROSTOMY;  CONVERT G-TUBE TO G-JTUBE  FLUOROSCOPY TIME:  16 minutes 24 seconds, 1016.6 mGy  TECHNIQUE: The procedure, risks, benefits, and alternatives were explained to the patient. Questions regarding the procedure were encouraged and answered. The patient understands and consents to the  procedure. As antibiotic prophylaxis, cefazolin 2 g was ordered pre-procedure and administered intravenously within one hour of incision. . A 5 French angiographic catheter was placed as orogastric tube. The upper abdomen was prepped with Betadine, draped in usual sterile fashion, and infiltrated locally with 1% lidocaine. 1 mg glucagon was administered intravenously to facilitate gastric distention and slow peristalsis. Stomach was insufflated using air through the orogastric tube. An 2118 French sheath needle was advanced percutaneously into the gastric lumen under fluoroscopy. Gas could be aspirated and a small contrast injection confirmed intraluminal spread. The sheath was exchanged over a guidewire for a 9 [place] vascular sheath, through which the snare device was advanced and used to snare a guidewire passed through the orogastric tube.  This was withdrawn, and the snare attached to the 20 French pull-through gastrostomy tube, which was advanced antegrade, positioned with the internal bumper securing the anterior gastric wall to the anterior abdominal wall. Small contrast injection confirms appropriate positioning. The external bumper was applied and the catheter was flushed.  A 5 French angiographic catheter was placed coaxially through the gastrostomy tube and used to direct an angled stiff glidewire across the pylorus, beyond the proximal duodenal perforation, and into the proximal jejunum just beyond the ligament of Treitz. Over this, a coaxial jejunostomy feeding adapter was advanced. Contrast injection confirmed patency and appropriate position of the gastric and jejunal lumens. No extravasation. The lumens were flushed with saline and capped. The patient tolerated the procedure well.  COMPLICATIONS: COMPLICATIONS none  IMPRESSION: 1. Technically successful 20 French pull-through gastrostomy placement under fluoroscopy. 2. Technically successful coaxial jejunostomy feeding adapter placement to the ligament  of Treitz.   Electronically Signed   By: Corlis Leak M.D.   On: 08/12/2014 16:12   Ir Adele Schilder Tamsen Snider Convert Gastr-jej Per W/fl Mod Sed  08/12/2014   CLINICAL DATA:  Perforated duodenal ulcer, post g patch with persistent leak. Percutaneous gastrojejunostomy as requested for gastric decompression and small bowel enteral feeding support.  EXAM: PERC PLACEMENT GASTROSTOMY;  CONVERT G-TUBE TO G-JTUBE  FLUOROSCOPY TIME:  16 minutes 24 seconds, 1016.6 mGy  TECHNIQUE: The procedure, risks, benefits, and alternatives were explained to the patient. Questions regarding the procedure were encouraged and answered. The patient understands and consents to the procedure. As antibiotic prophylaxis, cefazolin 2 g was ordered pre-procedure and administered intravenously within one hour of incision. . A 5 French angiographic catheter was placed as orogastric tube. The upper abdomen was prepped with Betadine, draped in usual sterile fashion, and infiltrated locally with 1% lidocaine. 1 mg glucagon was administered intravenously to facilitate gastric distention and slow peristalsis. Stomach was insufflated using air through the orogastric tube. An 67 French sheath needle was advanced percutaneously into the gastric lumen under fluoroscopy. Gas could be aspirated and a small contrast injection confirmed intraluminal spread. The sheath was exchanged over a guidewire for a 9 [place] vascular sheath, through which the snare device was advanced and used to snare a guidewire passed through the orogastric tube. This was withdrawn, and the snare attached to the 20 French pull-through gastrostomy tube, which was advanced antegrade, positioned with the internal bumper securing the anterior gastric wall to the anterior abdominal wall. Small contrast injection confirms appropriate positioning. The external bumper was applied and the catheter was flushed.  A 5 French angiographic catheter was placed coaxially through the gastrostomy tube and used to  direct an angled stiff glidewire across the pylorus, beyond the proximal duodenal perforation, and into the proximal jejunum just beyond the ligament of Treitz. Over this, a coaxial jejunostomy feeding adapter was advanced. Contrast injection confirmed patency and appropriate position of the gastric and jejunal lumens. No extravasation. The lumens were flushed with saline and capped. The patient tolerated the procedure well.  COMPLICATIONS: COMPLICATIONS none  IMPRESSION: 1. Technically successful 20 French pull-through gastrostomy placement under fluoroscopy. 2. Technically successful coaxial jejunostomy feeding adapter placement to the ligament of Treitz.   Electronically Signed   By: Corlis Leak M.D.   On: 08/12/2014 16:12   Dg Chest Port 1 View  08/23/2014   CLINICAL DATA:  Left chest pain  EXAM: PORTABLE CHEST - 1 VIEW  COMPARISON:  08/18/2014  FINDINGS: Patient is rotated.  Lungs are essentially clear. No focal  consolidation. No pleural effusion or pneumothorax.  Cardiomegaly.  Left IJ venous catheter terminates at the cavoatrial junction. Right IJ dual lumen dialysis catheter terminates in the lower SVC.  IMPRESSION: No evidence of acute cardiopulmonary disease.   Electronically Signed   By: Charline Bills M.D.   On: 08/23/2014 19:17   Dg Chest Port 1 View  08/18/2014   CLINICAL DATA:  Shortness of breath at rest.  Having hemodialysis.  EXAM: PORTABLE CHEST - 1 VIEW  COMPARISON:  08/15/2014.  FINDINGS: The cardiac silhouette remains mildly enlarged. Stable right jugular double-lumen catheter. Clear lungs. The pulmonary vasculature remains prominent. No pleural fluid. Minimal right shoulder degenerative changes.  IMPRESSION: Stable cardiomegaly and pulmonary vascular congestion.   Electronically Signed   By: Beckie Salts M.D.   On: 08/18/2014 09:47   Dg Chest Port 1 View  08/15/2014   CLINICAL DATA:  Shortness of breath.  EXAM: PORTABLE CHEST - 1 VIEW  COMPARISON:  08/13/2014 and multiple prior  chest radiographs  FINDINGS: Cardiomegaly and pulmonary vascular congestion again noted.  A right IJ central venous catheter is noted with tips overlying the upper and mid SVC.  A left central venous catheter is present with tip overlying the lower SVC.  There is no evidence of focal airspace disease, pulmonary edema, suspicious pulmonary nodule/mass, pleural effusion, or pneumothorax. No acute bony abnormalities are identified.  IMPRESSION: Cardiomegaly with pulmonary vascular congestion.   Electronically Signed   By: Harmon Pier M.D.   On: 08/15/2014 17:28   Dg Chest Port 1v Same Day  08/03/2014   CLINICAL DATA:  59 year old female dialysis patient with severe shortness of breath. Respiratory distress. Initial encounter.  EXAM: PORTABLE CHEST - 1 VIEW SAME DAY  COMPARISON:  Chest CTA 07/29/2014 and earlier.  FINDINGS: Portable AP semi upright view at 1400 hrs. Bilateral IJ approach chest catheters appears stable. Stable lung volumes. Stable cardiomegaly and mediastinal contours. No pneumothorax, pulmonary edema, pleural effusion or consolidation. No acute pulmonary opacity identified.  IMPRESSION: No acute cardiopulmonary abnormality.   Electronically Signed   By: Odessa Fleming M.D.   On: 08/03/2014 14:18   Dg Abd Portable 1v  08/23/2014   CLINICAL DATA:  JP drain, broken, initial encounter  EXAM: PORTABLE ABDOMEN - 1 VIEW  COMPARISON:  CT abdomen pelvis dated 08/21/2014  FINDINGS: A portion of the right flank is excluded from this single image.  The patient's right abdominal surgical drain is not visualized.  Gastrojejunostomy in satisfactory position.  IMPRESSION: The patient's right abdominal surgical drain is not visualized.  Gastrojejunostomy in satisfactory position.   Electronically Signed   By: Charline Bills M.D.   On: 08/23/2014 19:11   Ct Image Guided Fluid Drain By Catheter  08/17/2014   CLINICAL DATA:  Postoperative intra-abdominal abscess, subsequent encounter. Small pelvic fluid collection.   EXAM: CT-GUIDED DRAIN PLACEMENT IN PELVIC FLUID COLLECTION  Physician: Rachelle Hora. Lowella Dandy, MD  FLUOROSCOPY TIME:  None  MEDICATIONS: 50 mcg fentanyl  ANESTHESIA/SEDATION: Patient was monitored by a radiology nurse during the procedure.  PROCEDURE: Informed consent was obtained for drain placement. Patient was placed on her left side. Images through the pelvis were obtained. The right buttock was prepped and draped in sterile fashion. 1% lidocaine was used for a local anesthetic. An 18 gauge needle was directed into the pelvic fluid collection with CT guidance from a transgluteal approach. Thick yellow purulent fluid was aspirated. A stiff Amplatz wire was placed. The tract was dilated to accommodate a 10.2 [place]  multipurpose drain. It was technically difficult to advance the drain into the collection due to the small size of the collection and the patient's body habitus. Eventually, the drain was successfully placed within the collection. 5 mL of yellow purulent fluid was obtained. Catheter was sutured to the skin and attached to a suction bulb. Fluid was sent for culture.  FINDINGS: Small fluid collection just posterior to the uterus. A total of 5 mL of yellow purulent fluid was removed.  Estimated blood loss: Minimal  COMPLICATIONS: None  IMPRESSION: CT-guided placement of a drainage catheter in the small pelvic fluid collection.   Electronically Signed   By: Richarda Overlie M.D.   On: 08/17/2014 17:21    PHYSICAL EXAM  the patient is oriented to person time and place. She has multiple drainage tubes. Lungs reveal scattered rhonchi. Cardiac exam reveals an S1 and S2. There is no significant peripheral edema.   TELEMETRY: I have reviewed telemetry today Aug 29, 2014. Once again she has a supraventricular tachycardia with a rate in the range of 110-120.   ASSESSMENT AND PLAN:        Cerebral thrombosis with cerebral infarction     The patient does have pulmonary hypertension with right heart dysfunction and  shunting from the right to left. So far there has been no proof of atrial fibrillation.     ESRD needing dialysis    PSVT (paroxysmal supraventricular tachycardia)     She is again having increased supraventricular tachycardia. We are not able to use any significant dose of beta blocker because of her blood pressure. I feel that it is reasonable to try amiodarone again. There was question that she might be able to take oral amiodarone. However she's had diarrhea today and she will not take any pills orally. She has an IV port that can be used. I've chosen to start IV amiodarone to see the response. Ultimately we will want to convert this to oral amiodarone.  As part of this evaluation today I spent greater than 60 minutes. I have carefully reviewed the record to be sure that I understand the basis of her arrhythmia and the rationale for treating it over time.Willa Rough 08/29/2014 3:46 PM

## 2014-08-29 NOTE — Progress Notes (Signed)
Patient declined CPAP use tonight, stated she cannot sleep with it over her face. RT will continue to monitor.

## 2014-08-29 NOTE — Progress Notes (Signed)
59 Days Post-Op  Subjective: Abdomen is sore, but looks much better today.  She is asking about PO's and would like some ginger ale.    Objective: Vital signs in last 24 hours: Temp:  [97.7 F (36.5 C)-98.1 F (36.7 C)] 98.1 F (36.7 C) (05/28 0800) Pulse Rate:  [104-149] 112 (05/28 0800) Resp:  [16-24] 24 (05/28 0800) BP: (93-115)/(56-76) 93/56 mmHg (05/28 0800) SpO2:  [97 %-100 %] 97 % (05/28 0800) FiO2 (%):  [28 %] 28 % (05/27 1309) Weight:  [95.7 kg (210 lb 15.7 oz)] 95.7 kg (210 lb 15.7 oz) (05/28 0353) Last BM Date: 08/28/14 Tube feeding Stool x 1 recorded Afebrile, VSS No real change in labs Last CT on 08/21/14 Intake/Output from previous day: 05/27 0701 - 05/28 0700 In: 2755.7 [I.V.:220; NG/GT:1275; IV Piggyback:300; TPN:960.7] Out: 1325 [Drains:1325] Intake/Output this shift:    General appearance: alert, cooperative and no distress Resp: clear to auscultation bilaterally GI: soft, + BS and +BM.  wound looks better. drainage from duodenal drain 625.    Lab Results:   Recent Labs  08/28/14 2025 08/29/14 0520  WBC 15.7* 14.3*  HGB 9.7* 9.1*  HCT 31.0* 29.3*  PLT 336 303    BMET  Recent Labs  08/27/14 0539 08/29/14 0520  NA 132* 140  K 3.5 3.5  CL 96* 102  CO2 27 28  GLUCOSE 457* 104*  BUN 34* 32*  CREATININE 4.56* 4.48*  CALCIUM 7.8* 8.3*   PT/INR No results for input(s): LABPROT, INR in the last 72 hours.   Recent Labs Lab 08/25/14 0525 08/25/14 0747 08/27/14 0539 08/29/14 0520  AST 17  --  17  --   ALT 9*  --  8*  --   ALKPHOS 127*  --  108  --   BILITOT 1.3*  --  1.0  --   PROT 6.7  --  6.6  --   ALBUMIN 2.0* 2.1* 1.9* 1.9*     Lipase  No results found for: LIPASE   Studies/Results: No results found.  Medications: . sodium chloride   Intravenous Once  . antiseptic oral rinse  7 mL Mouth Rinse q12n4p  . budesonide (PULMICORT) nebulizer solution  0.25 mg Nebulization BID  . cefTAZidime (FORTAZ)  IV  2 g Intravenous Q  T,Th,Sa-HD  . chlorhexidine  15 mL Mouth Rinse BID  . darbepoetin (ARANESP) injection - DIALYSIS  200 mcg Intravenous Q Thu-HD  . feeding supplement (VITAL AF 1.2 CAL)  1,000 mL Per Tube Q24H  . ipratropium  0.5 mg Nebulization TID  . levalbuterol  1.25 mg Nebulization TID  . levothyroxine  12.5 mcg Intravenous Daily  . metoprolol  2.5 mg Intravenous 4 times per day  . metronidazole  500 mg Intravenous Q8H  . sodium chloride  10-40 mL Intracatheter Q12H  . sodium hypochlorite   Topical BID  . vitamin C  1,000 mg Per Tube Daily   . 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        LOS: 73 days    [name] 08/29/2014

## 2014-08-30 MED ORDER — ZOLPIDEM TARTRATE 5 MG PO TABS
5.0000 mg | ORAL_TABLET | Freq: Once | ORAL | Status: AC
  Administered 2014-08-31: 5 mg via ORAL
  Filled 2014-08-30: qty 1

## 2014-08-30 MED ORDER — ALPRAZOLAM 0.5 MG PO TABS
0.5000 mg | ORAL_TABLET | ORAL | Status: DC
  Administered 2014-09-01 – 2014-09-10 (×5): 0.5 mg via ORAL
  Filled 2014-08-30 (×5): qty 1

## 2014-08-30 NOTE — Progress Notes (Signed)
[facility] Progress Note  Assessment/Plan: 1. New ESRD 1. On TTS schedule as inpatient 2. No permanent access, using TDC 3. CLIP on hold as still significant inpatient issues 4. Holding on permanent access for now, AVF from 01/28/14; will need to address prior to DC 1. Last seen by [facility] 06/2014; likely needing revision 2. [facility] working well for now 5. 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 Cont with 4K bath next for rhythm prevention 7. Panic with HD: try low dose alprazolam preHD 2. Vol Status 1. Using Bed Weights currently, try to transition to standing 2. Still appears volume overload, cont gentle UF but also with > 1L/d loss from GI drains 3. Try for 3.5L UF this Tx  4. Having trouble achieving UF 2/2 SVT and hypotension with HD, see #11 3. Pseudomonal pelvic abscess on Ceftaz/Flagyl, hx/o C Diff 4. CVA acute by MRI 3/19 5. Perf pyloric ulcer SP exlap, w duod fistula to suction high output off of octreotide, CCS following 6. Nutrition w G-J tube on TF's (VItal)+trying to wean TNA-  7. C diff - s/p po Vanc - on flagyl with ABX 8. Anemia  1. aranesp to 200 qThurs 2. Finished Fe load 5/21 3. Transfused 2u PRBC 08/27/14 9. MBD - iPTH 130 5/12, low P resolved  10. Parox SVT on IV MTP q 4hrs.  Restarted Amio 5/28 11. 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 Vitamin C deficiency - getting 1000 mg IV daily w TNA, repeat level ordered for Clydia Llano MD  08/30/2014, 7:23 AM   Subjective:    HD yesterday 2L UF Seems having panic/anxiety during HD    Objective Filed Vitals:   08/30/14 0450 08/30/14 0500 08/30/14 0600 08/30/14 0719  BP:  107/65 108/71 107/88  Pulse:  118 107 103  Temp:    97.4 F (36.3 C)  TempSrc:    Oral  Resp:  Height:      Weight: 95.8 kg (211 lb 3.2 oz)     SpO2:    99%   Physical Exam General: Walking with PT Heart:tachy reg Lungs:clear to A/P now Abdomen: obese, anasarca with dependent back/buttock  edema Extremities: 1-2+ dependent edema Dialysis Access: right IJ   Dialysis Orders:  Additional Objective Labs: Basic Metabolic Panel:  Recent Labs Lab 08/25/14 0747 08/27/14 0539 08/29/14 0520  NA 139 132* 140  K 4.5 3.5 3.5  CL 100* 96* 102  CO2 GLUCOSE 90 457* 104*  BUN 34* 34* 32*  CREATININE 5.05* 4.56* 4.48*  CALCIUM 8.1* 7.8* 8.3*  PHOS 6.1* 4.4 4.4   Liver Function Tests:  Recent Labs Lab 08/25/14 0525 08/25/14 0747 08/27/14 0539 08/29/14 0520  AST 17  --  17  --   ALT 9*  --  8*  --   ALKPHOS 127*  --  108  --   BILITOT 1.3*  --  1.0  --   PROT 6.7  --  6.6  --   ALBUMIN 2.0* 2.1* 1.9* 1.9*  CBC:  Recent Labs Lab 08/24/14 0520  08/26/14 0648 08/27/14 0539 08/28/14 2025 08/29/14 0520 08/29/14 2008  WBC 15.7*  < > 13.2* 13.9* 15.7* 14.3* 20.5*  NEUTROABS 12.5*  --   --   --   --   --   --   HGB 7.6*  < > 7.1* 6.8* 9.7* 9.1* 9.9*  HCT 25.0*  < > 24.1* 24.7* 31.0* 29.3* 31.0*  MCV 96.2  < > 97.2 103.8* 94.5 94.8 93.9  PLT 309  < > 327 323 336 303 229  < > = values in this interval not displayed. Blood Culture    Component Value Date/Time   SDES ABSCESS PELVIS 08/17/2014 1451   SPECREQUEST NONE 08/17/2014 1451   CULT  08/17/2014 1451    NO GROWTH 3 DAYS [facility]    REPTSTATUS 08/21/2014 FINAL 08/17/2014 1451    Cardiac Enzymes:  Recent Labs Lab 08/23/14 1944 08/24/14 0221 08/24/14 0630  TROPONINI 0.04* 0.03 0.03   CBG:  Recent Labs Lab 08/24/14 1631 08/25/14 0110 08/25/14 1741 08/27/14 1218 08/27/14 1817  GLUCAP 139* 122* 124* 112* 157*    Medications: . sodium chloride 10 mL/hr at 08/19/14 0950  . amiodarone 30 mg/hr (08/29/14 2332)   . sodium chloride   Intravenous Once  . antiseptic oral rinse  7 mL Mouth Rinse q12n4p  . budesonide (PULMICORT) nebulizer solution  0.25 mg Nebulization BID  . cefTAZidime (FORTAZ)  IV  2 g Intravenous Q T,Th,Sa-HD  . chlorhexidine  15 mL Mouth  Rinse BID  . darbepoetin (ARANESP) injection - DIALYSIS  200 mcg Intravenous Q Thu-HD  . feeding supplement (VITAL AF 1.2 CAL)  1,000 mL Per Tube Q24H  . ipratropium  0.5 mg Nebulization TID  . levalbuterol  1.25 mg Nebulization TID  . levothyroxine  12.5 mcg Intravenous Daily  . metoprolol  2.5 mg Intravenous 4 times per day  . metronidazole  500 mg Intravenous Q8H  . sodium chloride  10-40 mL Intracatheter Q12H  . sodium hypochlorite   Topical BID  . vitamin C  1,000 mg Per Tube Daily

## 2014-08-30 NOTE — Progress Notes (Signed)
60 Days Post-Op  Subjective: Her biggest complaint today is allot of diarrhea, and not sleeping.  On her exam I see nothing to correspond to her rising WBC.  Wound looks much better than Friday when I first saw her.  Objective: Vital signs in last 24 hours: Temp:  [97.4 F (36.3 C)-98.3 F (36.8 C)] 97.4 F (36.3 C) (05/29 0719) Pulse Rate:  [103-151] 103 (05/29 0719) Resp:  [15-30] 17 (05/29 0719) BP: (82-125)/(50-92) 107/88 mmHg (05/29 0719) SpO2:  [95 %-99 %] 99 % (05/29 0719) Weight:  [95.8 kg (211 lb 3.2 oz)] 95.8 kg (211 lb 3.2 oz) (05/29 0450) Last BM Date: 08/29/14  PO 200 TF 1320 Fistula drain 225 Gastrotomy 800 Stool x 2 Afebrile, tachycardic up to 128 yesterday WBC continues to rise up to 20.5   Last CT 08/21/14 Intake/Output from previous day: 05/28 0701 - 05/29 0700 In: 2574.8 [P.O.:200; I.V.:584.8; NG/GT:1320; IV Piggyback:350] Out: 3025 [Drains:1025] Intake/Output this shift:    General appearance: alert, cooperative, no distress and tired. GI: soft, non tender, wound is clean, no purulent drainage.  drainage fromt he duodeanl drain is clear thick fluid.    Lab Results:   Recent Labs  08/29/14 0520 08/29/14 2008  WBC 14.3* 20.5*  HGB 9.1* 9.9*  HCT 29.3* 31.0*  PLT 303 229    BMET  Recent Labs  08/29/14 0520  NA 140  K 3.5  CL 102  CO2 28  GLUCOSE 104*  BUN 32*  CREATININE 4.48*  CALCIUM 8.3*   PT/INR No results for input(s): LABPROT, INR in the last 72 hours.   Recent Labs Lab 08/25/14 0525 08/25/14 0747 08/27/14 0539 08/29/14 0520  AST 17  --  17  --   ALT 9*  --  8*  --   ALKPHOS 127*  --  108  --   BILITOT 1.3*  --  1.0  --   PROT 6.7  --  6.6  --   ALBUMIN 2.0* 2.1* 1.9* 1.9*     Lipase  No results found for: LIPASE   Studies/Results: No results found.  Medications: . sodium chloride   Intravenous Once  . [START ON 09/01/2014] ALPRAZolam  0.5 mg Oral Q T,Th,Sa-HD  . antiseptic oral rinse  7 mL Mouth Rinse q12n4p   . budesonide (PULMICORT) nebulizer solution  0.25 mg Nebulization BID  . cefTAZidime (FORTAZ)  IV  2 g Intravenous Q T,Th,Sa-HD  . chlorhexidine  15 mL Mouth Rinse BID  . darbepoetin (ARANESP) injection - DIALYSIS  200 mcg Intravenous Q Thu-HD  . feeding supplement (VITAL AF 1.2 CAL)  1,000 mL Per Tube Q24H  . ipratropium  0.5 mg Nebulization TID  . levalbuterol  1.25 mg Nebulization TID  . levothyroxine  12.5 mcg Intravenous Daily  . metoprolol  2.5 mg Intravenous 4 times per day  . metronidazole  500 mg Intravenous Q8H  . sodium chloride  10-40 mL Intracatheter Q12H  . sodium hypochlorite   Topical BID  . vitamin C  1,000 mg Per Tube Daily    Assessment/Plan SOB/hypoxia/volume over load 06/17/14 Pneumoperitoneum 07/01/14 Perforated pyloric ulcer with diffuse peritonitis, ischemic necrosis of mid ileum, possibly embolic Exploratory laparotomy, evacuation of ascites, closure of perforated pyloric ulcer with omental patch, segmental resection of 14 inches of ileum with primary anastomosis Doppler evaluation of small bowel mesenteric arterial vessels.07/01/14, Dr. 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  HER last CT was on 5/20, will check on timing for repeat CT.  Continue drain, tube feeds and dressing changes.   LOS: 74 days    Chatara Lucente 08/30/2014

## 2014-08-30 NOTE — Progress Notes (Signed)
PROGRESS NOTE  [name] ZOX:[id number] DOB: [birth year] DOA: 06/17/2014 PCP: [name], MD  Brief Narrative: 59 yo ? smoker with hx HTN, COPD, CKD IV followed @ [facility]-[name] Page, failed L AV fistula, initially admitted 3/16 with flu and acute hypoxic resp failure. Ultimately tx to [facility] due to need for HD. L sided weakness 3/18 and found to have R ACA stroke-showed acute infarct R ACA (3/19). Patient more lethargic 3/30 and CT abd=perf viscus 3/30. She was taken to the OR for ex lap for perforated pyloric ulcer with diffuse peritonitis, ischemic necrosis of mid ileum. Patient developed septic shock on 4-1, she was started on pressors and wean off pressors on 4-3. Subsequently, she was diagnosed with pelvic abscess by CT scan perform on 4-7. She underwent pelvic drain placement by IR on 4/8= culture neg 08/15/14 another drained placed in the pelvic cul-de-sac abscess with culture = pseudomonas. On 08/20/14, Gluteal drain was inadvertently pulled out. Repeat CT scan shows improvement in the fluid collection so decision was to leave it out and continue IV abx. Pt hospital stay also complicated by high output duodenal (enterocutaneous fistula) fistula. As a result, pt was made npo and started on TPN. Now stable, on TNA + very high output from fistula/leak per [facility]. Had a G_J tube placed by Dr. [name] of IR and Gen surgery has continued to assist with management of her multiple surgical comorbidities C. diff diagnosed 4-12-initially unresponsive to flagyl, and she was started on PO vancomycin 4-16.  06/24/14 Develops run of SVT, atrial tachycardia. Cardiology was helping with management. Course complicated with high RV pressure and Cor Pulmonale; 4/27 CT angio negative for PE. On 5/2 she developed hypoxemia, hypotension and SVT during dialysis treatment. Dialysis was stopped. She received IV bolus. She was started on BIPAP. She was transferred to Step down unit. PCCM and cardiology re  consulted. Her Hr decreased, BP improved. She was taken off BIPAP. This recurred on 5/15 with episodic SVT. Since then she has had intermitten short episodes of SVT, but in past 24 hours she has had more sustained SVT, therefore; cardiology was reconsulted on on 08/29/14.  Assessment/Plan: Status post exploratory laparotomy with closure of perforated pyloric ulcer/abdominal abscess / small bowel resection w/ postop ileus and now with enterocutaneous fistula. Pelvic abscess s/p percutaneous drain placement 4/8; had leakage from anastomosis site. Received Zosyn for 23 days. Chronic low grade leukocytosis range 13-15 -IV antibiotics were discontinued by Surgery on 4/22. Also received 18 days of Vancomycin and fluconazole.  -Due to Fistula, pt is back to NPO state, TPN was started -Fistula study done shows JP drain connection to the duodenal bulb-5-02. - 08/15/2014 repeat CT abdomen with 3.0 x 3.5 x 5.7 cm collection in the pelvic cul-de-sac and extraluminal air alongside JP drain in anterior abd wall -Drain with old and new blood, Hb trending down, transfuse 2 unit PRBC 5/10 -continues to have high output from Fistula/leak. Had G_J placed by IR 08/12/14 at request of gen surgery -CT repeat shows 5 cm post cul-de-sac abscess which was drained on 5/16-culture revealed pseudomonas aeruginosa -ID was consulted as patient had Pseudomonas growing from her culture wounds and they started back Flagyl as well as Elita Quick on 5/16 and are following and we await final growth from her new abscess drained on 5/16. -Per ID recommendation--plan D#13/21 daysFortaz and D#13/ 28 days metronidazole -Patient inadvertently pulled out her gluteal/cul-de-sac drain May 19. Seen by interventional radiology  and they recommended repeat CT scan, which was done. Shows that the fluid collection has decreased in size. They do not plan to replace the drain at this time. Continue with antibiotics alone.  -08/21/2014 CT scan did suggest a  transmural defect in the stomach wall near the patient's pyloric Cheree Ditto patch. We will let surgery address this. -Patient accidentally pulled out her JP drain on 5/22. This has been replaced by surgery on 5/23. -Octreotide was discontinued 5/23--enterocutaneous fistula output appears to be decreasing with 1000 mL in the past 24 hours -finished TPN on 08/27/14 -Enteral Feeding was increased 5/27 to 55 mL per hour--tolerating without residuals -08/29/14--started clear liquids -08/30/14--increased WBC-->check Cdiff -Repeat CT abd/pelvis if WBC continues to rise  PSVT --?MAT -Felt to be secondary to severe anxiety with dialysis as well as multiple co-morbidities and COPD/Cor Pulmonale  -IV metoprolol use limited by pt's soft BP -appreciate Cardiology-->started IV amiodarone 5/28 -V-Q scan with intermediate probability for pulmonary embolism.  -CTA 4/27, negative for PE  -Chest pain on 5/22, likely related to the fact that JP drain was accidentally pulled out, which resulted in severe abdominal pain. Chest x-ray did not show any concerning findings. EKG was nonischemic. Troponins are unremarkable. Don't anticipate further workup unless she has recurrence of her symptoms. --HR improving on amiodarone  ESRD new, now on HD this admit complicated by intermittent hypotension AKI on CKD 4, due to sepsis, shock Ongoing hemodialysis per Nephrology - perm-cath placed 3/26 d/t AVF problems Patient AV fistula reviewed and not mature yet per Dr. Paulene Floor need to revisit prior to d/c -TPN weaned off 08/27/14 -Continue hemodialysis Tuesday, Thursday, Saturday while the patient is in the hospital -Xanax with each HD due to significant anxiety with HD  C. difficile colitis still on contact precautions -C diff positive on 4-12, Received 4 days of flagyl without improvement, then started on PO vanc -Completed 3 weeks of Oral Vanc-stopped 5/7, this was 1 week after IV Zosyn was stopped 5/5 -5/28--having  loose stools again-->Cdiff PCR -keep on contact precautions  ACA CVA with Lt sided weakness -06/23/14--TEE ;normal LV function; no LAA thrombus -Started on ASA  per Neuro -Will need ongoing long term rehab - PT/OT to cont to follow while inpatient -Patient remains very weak and deconditioned. 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 Fluids/volume managed with hemodialysis.  Elevated PA pressures on 2-D echo; CTA negative for PE - 07/23/2014 echo-- EF 60-61%, grade 1 diastolic dysfunction, Severe RV dilatation, PAP 90  Small PFO Has been evaluated by Cardiology. No intervention planned currently.  Anemia of critical illness/chronic disease -and bleeding from leak/fistula which has resolved. -s/p 2U PRBC 4/13 and 5/10 and 5/26 Aranesp/iron per renal -transfuse for hemoglobin less than 7  Acute hypoxic Respiratory Failure:  -This was secondary to H1N1, hypervolemia, HCAP >> resolved.  -also has COPD and Cor Pulmonale - patient complained of some increased shortness of breath today-- wheezing on exam - start scheduled Xopenex and Atrovent - presently stable on 2 L-3L Thrombocytopenia; resolved.  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 On TPN now, CBGs stabilizedin 120-160s. Hba1c 6.5 -08/27/14--question accuracy of CBG of 457 on BMP this  am--likely contaminated with TPN  Abnormal TSH -Free T4 WNL, T3 1.8 -most likely sick thyroid with ongoing infection and body stress; also due to amiodarone use. TSH in 7 range -continue low dose synthroid given PSVT, now on IV synthroid   L Renal mass  -noted on [place], Indeterminate 1.9 cm hypoechoic mass off the interpolar region of the left kidney -may potentially represent a cyst however solid mass is not excluded -needs outpatient FU for this  Obesity Inadequate oral intake related to altered GI function as evidenced by limited intake, ongoing.  Body mass index is 35.03 kg/(m^2).  Fall on May 18 Examination did not reveal any injuries.   DVT prophylaxis: SCDs Code Status: FULL Family Communication: Discussed with patient. No family at bedside. Disposition Plan: Have been unable to transfer patient out of stepdown unit due to dyspnea and tachycardia. She also has complex wound care needs. Dialysis is ongoing. Not ready for discharge.  Consultants: Cardiology Nephrology PCCM Gen Surgery  Palliative care    Procedures/Studies: Ct Abdomen Pelvis Wo Contrast  08/22/2014   CLINICAL DATA:  Abdominal abscess. No intravenous contrast due to renal failure.  EXAM: CT ABDOMEN AND PELVIS WITHOUT CONTRAST  TECHNIQUE: Multidetector CT imaging of the abdomen and pelvis was performed following the standard protocol without IV contrast.  COMPARISON:  08/15/2014  FINDINGS: BODY WALL: Open laparotomy without fluid collection.  LOWER CHEST: Chronic triangular opacity in the posterior costophrenic sulcus on the left, atelectasis versus scarring  ABDOMEN/PELVIS:  Liver: No focal abnormality.  Biliary: High-density material within the gallbladder showing mild wall thickening which is chronic.  Pancreas: Unremarkable.  Spleen: Unremarkable.  Adrenals: Unremarkable.  Kidneys and ureters: No hydronephrosis or stone. Presumed 1 cm cyst from the interpolar left kidney  Bladder: Decompressed.  No  pathologic findings.  Reproductive: Calcified and noncalcified uterine fibroids, better seen on previous enhanced imaging.  Bowel: Right upper quadrant drain related to perforated pyloric ulcer status post Cheree Ditto patch. Specially visible on coronal imaging, there is a gas channel through the pylorus measuring 6 to 9 mm in diameter. No bowel obstruction. Enteroenterostomy noted in the pelvis. Oral contrast again seen in the colon. No notable colonic wall thickening in this patient with history of C difficile. Negative appendix. Percutaneous gastrojejunostomy tube is in good position.  Retroperitoneum: No mass or adenopathy.  Peritoneum: Known thick walled collection in the rectovaginal recess smaller than 08/15/2014 at 40 x 16 mm as compared to 51 x 32 mm. No new collection is identified. No free pneumoperitoneum.  Vascular: 3 cm fusiform infrarenal aortic aneurysm.  OSSEOUS: No acute abnormalities.  IMPRESSION: 1. Near the patient's pyloric Cheree Ditto patch is a transmural defect in the stomach wall, contiguous with the surgical drain. If repeat surgery is considered, oral contrast could confirm an open ulcer. 2. Decreased rectovaginal recess fluid collection, now 4 x 1.5 cm (previously 5 x 3 cm).   Electronically Signed   By: Marnee Spring M.D.   On: 08/22/2014 01:49   Ct Abdomen Pelvis Wo Contrast  08/15/2014   CLINICAL DATA:  Intra abdominal abscess.  EXAM: CT ABDOMEN AND PELVIS WITHOUT CONTRAST  TECHNIQUE: Multidetector CT imaging of the abdomen and pelvis was performed following the standard protocol without IV contrast.  COMPARISON:  CT scan of Aug 03, 2014.  FINDINGS: Severe degenerative disc disease is noted at L5-S1. Minimal subsegmental atelectasis is noted posteriorly in the left lung base.  No gallstones are noted. No focal abnormality is noted in the liver, spleen or pancreas on these unenhanced images. Adrenal glands appear normal. Stable exophytic cyst is seen arising from midpole of left kidney. No  hydronephrosis or renal obstruction is noted. No renal or ureteral calculi are noted. 3 cm infrarenal abdominal aortic aneurysm is noted. Gastrojejunostomy tube is seen entering stomach percutaneously and with distal tip in the jejunum. JP surgical drain is seen entering right upper quadrant of abdomen underneath the liver with distal tip in the left upper quadrant. No significant fluid collection is noted around the drain. Fluid collection is again identified and posterior cul-de-sac of pelvis measuring 5.1 x 3.2 cm. Calcified degenerating fibroid is noted in the uterus. Urinary bladder is unremarkable. There is no evidence of bowel obstruction. The appendix appears normal. No significant adenopathy is noted. Large midline surgical anterior abdominal wall incision is noted.  IMPRESSION: Stable 3 cm infrarenal abdominal aortic aneurysm.  No change in position of JP surgical drain with distal tip in the left upper quadrant of the abdomen.  Interval placement of percutaneous gastrojejunostomy tube with distal tip in the jejunum.  Grossly stable size and appearance of 5 cm fluid collection seen in posterior cul-de-sac of the pelvis.  Continued presence uterine fibroids.   Electronically Signed   By: Lupita Raider, M.D.   On: 08/15/2014 15:15   Dg Chest 1 View  08/13/2014   CLINICAL DATA:  Shortness of breath  EXAM: CHEST  1 VIEW  COMPARISON:  08/03/2014  FINDINGS: Moderately severe cardiac enlargement. Central line in unchanged position on the right and left side. Mild vascular congestion with no evidence of edema effusion or consolidation.  IMPRESSION: No acute findings   Electronically Signed   By: Esperanza Heir M.D.   On: 08/13/2014 11:04   Dg Abd 1 View  08/03/2014   CLINICAL DATA:  NG tube placement  EXAM: ABDOMEN - 1 VIEW  COMPARISON:  07/13/2014  FINDINGS: NG tube tip is in the proximal stomach. The side port is near the GE junction.  IMPRESSION: NG tube tip in the proximal stomach.   Electronically  Signed   By: Charlett Nose M.D.   On: 08/03/2014 17:18   Ct Abdomen Pelvis W Contrast  08/04/2014   CLINICAL DATA:  New epigastric abdominal pain.  EXAM: CT ABDOMEN AND PELVIS WITH CONTRAST  TECHNIQUE: Multidetector CT imaging of the abdomen and pelvis was performed using the standard protocol following bolus administration of intravenous contrast.  CONTRAST:  OMNIPAQUE IOHEXOL 300 MG/ML  SOLN  COMPARISON:  07/16/2014  FINDINGS: There is a small volume extraluminal air alongside the JP drain in the anterior abdominal midline. Source of the air is not clear. No other extraluminal air is evident in the abdomen or pelvis. No undrained fluid collection is evident alongside the JP drain. There is a 3.5 x 5.7 x 3.0 cm contained collection in the pelvic cul-de-sac which could represent a small abscess.  There is an open midline abdominal wound without associated drainable collection. There is herniation of abdominal fat through the abdominal wall musculature at the location of the wound.  There are normal appearances of the liver, spleen, pancreas, adrenals and kidneys with the exception of a 1.8 cm simple left renal cyst. There is an unchanged 3 cm infrarenal abdominal aortic aneurysm. The duodenum is draped over the  aneurysm but appears intact.  There are multiple uterine fibroids.  There is no significant abnormality in the lower chest.  IMPRESSION: *Extraluminal air alongside the JP drain in the anterior abdominal midline, etiology not apparent on this scan *3.0 x 3.5 x 5.7 cm collection in the pelvic cul-de-sac *Unchanged 3 cm infrarenal abdominal aortic aneurysm directly adjacent to the duodenum *Open wound in the abdominal midline. No fluid collection near the wound.   Electronically Signed   By: Ellery Plunk M.D.   On: 08/04/2014 02:24   Ir Gastrostomy Tube Mod Sed  08/12/2014   CLINICAL DATA:  Perforated duodenal ulcer, post g patch with persistent leak. Percutaneous gastrojejunostomy as requested  for gastric decompression and small bowel enteral feeding support.  EXAM: PERC PLACEMENT GASTROSTOMY;  CONVERT G-TUBE TO G-JTUBE  FLUOROSCOPY TIME:  16 minutes 24 seconds, 1016.6 mGy  TECHNIQUE: The procedure, risks, benefits, and alternatives were explained to the patient. Questions regarding the procedure were encouraged and answered. The patient understands and consents to the procedure. As antibiotic prophylaxis, cefazolin 2 g was ordered pre-procedure and administered intravenously within one hour of incision. . A 5 French angiographic catheter was placed as orogastric tube. The upper abdomen was prepped with Betadine, draped in usual sterile fashion, and infiltrated locally with 1% lidocaine. 1 mg glucagon was administered intravenously to facilitate gastric distention and slow peristalsis. Stomach was insufflated using air through the orogastric tube. An 13 French sheath needle was advanced percutaneously into the gastric lumen under fluoroscopy. Gas could be aspirated and a small contrast injection confirmed intraluminal spread. The sheath was exchanged over a guidewire for a 9 [place] vascular sheath, through which the snare device was advanced and used to snare a guidewire passed through the orogastric tube. This was withdrawn, and the snare attached to the 20 French pull-through gastrostomy tube, which was advanced antegrade, positioned with the internal bumper securing the anterior gastric wall to the anterior abdominal wall. Small contrast injection confirms appropriate positioning. The external bumper was applied and the catheter was flushed.  A 5 French angiographic catheter was placed coaxially through the gastrostomy tube and used to direct an angled stiff glidewire across the pylorus, beyond the proximal duodenal perforation, and into the proximal jejunum just beyond the ligament of Treitz. Over this, a coaxial jejunostomy feeding adapter was advanced. Contrast injection confirmed patency and  appropriate position of the gastric and jejunal lumens. No extravasation. The lumens were flushed with saline and capped. The patient tolerated the procedure well.  COMPLICATIONS: COMPLICATIONS none  IMPRESSION: 1. Technically successful 20 French pull-through gastrostomy placement under fluoroscopy. 2. Technically successful coaxial jejunostomy feeding adapter placement to the ligament of Treitz.   Electronically Signed   By: Corlis Leak M.D.   On: 08/12/2014 16:12   Ir Adele Schilder Tamsen Snider Convert Gastr-jej Per W/fl Mod Sed  08/12/2014   CLINICAL DATA:  Perforated duodenal ulcer, post g patch with persistent leak. Percutaneous gastrojejunostomy as requested for gastric decompression and small bowel enteral feeding support.  EXAM: PERC PLACEMENT GASTROSTOMY;  CONVERT G-TUBE TO G-JTUBE  FLUOROSCOPY TIME:  16 minutes 24 seconds, 1016.6 mGy  TECHNIQUE: The procedure, risks, benefits, and alternatives were explained to the patient. Questions regarding the procedure were encouraged and answered. The patient understands and consents to the procedure. As antibiotic prophylaxis, cefazolin 2 g was ordered pre-procedure and administered intravenously within one hour of incision. . A 5 French angiographic catheter was placed as orogastric tube. The upper abdomen was prepped with Betadine, draped in  usual sterile fashion, and infiltrated locally with 1% lidocaine. 1 mg glucagon was administered intravenously to facilitate gastric distention and slow peristalsis. Stomach was insufflated using air through the orogastric tube. An 13 French sheath needle was advanced percutaneously into the gastric lumen under fluoroscopy. Gas could be aspirated and a small contrast injection confirmed intraluminal spread. The sheath was exchanged over a guidewire for a 9 [place] vascular sheath, through which the snare device was advanced and used to snare a guidewire passed through the orogastric tube. This was withdrawn, and the snare attached to the 20  French pull-through gastrostomy tube, which was advanced antegrade, positioned with the internal bumper securing the anterior gastric wall to the anterior abdominal wall. Small contrast injection confirms appropriate positioning. The external bumper was applied and the catheter was flushed.  A 5 French angiographic catheter was placed coaxially through the gastrostomy tube and used to direct an angled stiff glidewire across the pylorus, beyond the proximal duodenal perforation, and into the proximal jejunum just beyond the ligament of Treitz. Over this, a coaxial jejunostomy feeding adapter was advanced. Contrast injection confirmed patency and appropriate position of the gastric and jejunal lumens. No extravasation. The lumens were flushed with saline and capped. The patient tolerated the procedure well.  COMPLICATIONS: COMPLICATIONS none  IMPRESSION: 1. Technically successful 20 French pull-through gastrostomy placement under fluoroscopy. 2. Technically successful coaxial jejunostomy feeding adapter placement to the ligament of Treitz.   Electronically Signed   By: Corlis Leak M.D.   On: 08/12/2014 16:12   Dg Chest Port 1 View  08/23/2014   CLINICAL DATA:  Left chest pain  EXAM: PORTABLE CHEST - 1 VIEW  COMPARISON:  08/18/2014  FINDINGS: Patient is rotated.  Lungs are essentially clear. No focal consolidation. No pleural effusion or pneumothorax.  Cardiomegaly.  Left IJ venous catheter terminates at the cavoatrial junction. Right IJ dual lumen dialysis catheter terminates in the lower SVC.  IMPRESSION: No evidence of acute cardiopulmonary disease.   Electronically Signed   By: Charline Bills M.D.   On: 08/23/2014 19:17   Dg Chest Port 1 View  08/18/2014   CLINICAL DATA:  Shortness of breath at rest.  Having hemodialysis.  EXAM: PORTABLE CHEST - 1 VIEW  COMPARISON:  08/15/2014.  FINDINGS: The cardiac silhouette remains mildly enlarged. Stable right jugular double-lumen catheter. Clear lungs. The pulmonary  vasculature remains prominent. No pleural fluid. Minimal right shoulder degenerative changes.  IMPRESSION: Stable cardiomegaly and pulmonary vascular congestion.   Electronically Signed   By: Beckie Salts M.D.   On: 08/18/2014 09:47   Dg Chest Port 1 View  08/15/2014   CLINICAL DATA:  Shortness of breath.  EXAM: PORTABLE CHEST - 1 VIEW  COMPARISON:  08/13/2014 and multiple prior chest radiographs  FINDINGS: Cardiomegaly and pulmonary vascular congestion again noted.  A right IJ central venous catheter is noted with tips overlying the upper and mid SVC.  A left central venous catheter is present with tip overlying the lower SVC.  There is no evidence of focal airspace disease, pulmonary edema, suspicious pulmonary nodule/mass, pleural effusion, or pneumothorax. No acute bony abnormalities are identified.  IMPRESSION: Cardiomegaly with pulmonary vascular congestion.   Electronically Signed   By: Harmon Pier M.D.   On: 08/15/2014 17:28   Dg Chest Port 1v Same Day  08/03/2014   CLINICAL DATA:  59 year old female dialysis patient with severe shortness of breath. Respiratory distress. Initial encounter.  EXAM: PORTABLE CHEST - 1 VIEW SAME DAY  COMPARISON:  Chest CTA 07/29/2014 and earlier.  FINDINGS: Portable AP semi upright view at 1400 hrs. Bilateral IJ approach chest catheters appears stable. Stable lung volumes. Stable cardiomegaly and mediastinal contours. No pneumothorax, pulmonary edema, pleural effusion or consolidation. No acute pulmonary opacity identified.  IMPRESSION: No acute cardiopulmonary abnormality.   Electronically Signed   By: Odessa Fleming M.D.   On: 08/03/2014 14:18   Dg Abd Portable 1v  08/23/2014   CLINICAL DATA:  JP drain, broken, initial encounter  EXAM: PORTABLE ABDOMEN - 1 VIEW  COMPARISON:  CT abdomen pelvis dated 08/21/2014  FINDINGS: A portion of the right flank is excluded from this single image.  The patient's right abdominal surgical drain is not visualized.  Gastrojejunostomy in  satisfactory position.  IMPRESSION: The patient's right abdominal surgical drain is not visualized.  Gastrojejunostomy in satisfactory position.   Electronically Signed   By: Charline Bills M.D.   On: 08/23/2014 19:11   Ct Image Guided Fluid Drain By Catheter  08/17/2014   CLINICAL DATA:  Postoperative intra-abdominal abscess, subsequent encounter. Small pelvic fluid collection.  EXAM: CT-GUIDED DRAIN PLACEMENT IN PELVIC FLUID COLLECTION  Physician: Rachelle Hora. Lowella Dandy, MD  FLUOROSCOPY TIME:  None  MEDICATIONS: 50 mcg fentanyl  ANESTHESIA/SEDATION: Patient was monitored by a radiology nurse during the procedure.  PROCEDURE: Informed consent was obtained for drain placement. Patient was placed on her left side. Images through the pelvis were obtained. The right buttock was prepped and draped in sterile fashion. 1% lidocaine was used for a local anesthetic. An 18 gauge needle was directed into the pelvic fluid collection with CT guidance from a transgluteal approach. Thick yellow purulent fluid was aspirated. A stiff Amplatz wire was placed. The tract was dilated to accommodate a 10.2 [place] multipurpose drain. It was technically difficult to advance the drain into the collection due to the small size of the collection and the patient's body habitus. Eventually, the drain was successfully placed within the collection. 5 mL of yellow purulent fluid was obtained. Catheter was sutured to the skin and attached to a suction bulb. Fluid was sent for culture.  FINDINGS: Small fluid collection just posterior to the uterus. A total of 5 mL of yellow purulent fluid was removed.  Estimated blood loss: Minimal  COMPLICATIONS: None  IMPRESSION: CT-guided placement of a drainage catheter in the small pelvic fluid collection.   Electronically Signed   By: Richarda Overlie M.D.   On: 08/17/2014 17:21         Subjective: Patient complained of very loose stools yesterday. She denies any fevers, chills, chest pain, nausea, vomiting.  She states that her abdominal pain is about the same as usual. Denies any worsening shortness of breath.  Objective: Filed Vitals:   08/30/14 0600 08/30/14 0719 08/30/14 0949 08/30/14 1228  BP: 108/71 107/88  113/61  Pulse: 107 103  105  Temp:  97.4 F (36.3 C)    TempSrc:  Oral    Resp: 25 17  20   Height:      Weight:      SpO2:  99% 99% 100%    Intake/Output Summary (Last 24 hours) at 08/30/14 1319 Last data filed at 08/30/14 1004  Gross per 24 hour  Intake 2104.84 ml  Output   2235 ml  Net -130.16 ml   Weight change: 0.1 kg (3.5 oz) Exam:   General:  Pt is alert, follows commands appropriately, not in acute distress  HEENT: No icterus, No thrush, No neck mass, [place]/AT  Cardiovascular:  RRR, S1/S2, no rubs, no gallops  Respiratory: Bibasilar rales. No wheezing. Good air movement.  Abdomen: Soft/+BS,diffusely tender without rebound.  non distended, no guarding; midline abdominal wound with good granulation tissue with some yellow slough at the base.    Extremities: No edema, No lymphangitis, No petechiae, No rashes, no synovitis  Data Reviewed: Basic Metabolic Panel:  Recent Labs Lab 08/24/14 0520 08/25/14 0525 08/25/14 0747 08/27/14 0539 08/29/14 0520  NA  --  138 139 132* 140  K  --  4.5 4.5 3.5 3.5  CL  --  101 100* 96* 102  CO2  --  28 27 27 28   GLUCOSE  --  83 90 457* 104*  BUN  --  34* 34* 34* 32*  CREATININE  --  5.10* 5.05* 4.56* 4.48*  CALCIUM  --  8.3* 8.1* 7.8* 8.3*  MG 2.0  --   --  1.7  --   PHOS 5.0*  --  6.1* 4.4 4.4   Liver Function Tests:  Recent Labs Lab 08/25/14 0525 08/25/14 0747 08/27/14 0539 08/29/14 0520  AST 17  --  17  --   ALT 9*  --  8*  --   ALKPHOS 127*  --  108  --   BILITOT 1.3*  --  1.0  --   PROT 6.7  --  6.6  --   ALBUMIN 2.0* 2.1* 1.9* 1.9*   No results for input(s): LIPASE, AMYLASE in the last 168 hours. No results for input(s): AMMONIA in the last 168 hours. CBC:  Recent Labs Lab 08/24/14 0520   08/26/14 0648 08/27/14 0539 08/28/14 2025 08/29/14 0520 08/29/14 2008  WBC 15.7*  < > 13.2* 13.9* 15.7* 14.3* 20.5*  NEUTROABS 12.5*  --   --   --   --   --   --   HGB 7.6*  < > 7.1* 6.8* 9.7* 9.1* 9.9*  HCT 25.0*  < > 24.1* 24.7* 31.0* 29.3* 31.0*  MCV 96.2  < > 97.2 103.8* 94.5 94.8 93.9  PLT 309  < > 327 323 336 303 229  < > = values in this interval not displayed. Cardiac Enzymes:  Recent Labs Lab 08/23/14 1944 08/24/14 0221 08/24/14 0630  TROPONINI 0.04* 0.03 0.03   BNP: Invalid input(s): POCBNP CBG:  Recent Labs Lab 08/24/14 1631 08/25/14 0110 08/25/14 1741 08/27/14 1218 08/27/14 1817  GLUCAP 139* 122* 124* 112* 157*    No results found for this or any previous visit (from the past 240 hour(s)).   Scheduled Meds: . sodium chloride   Intravenous Once  . [START ON 09/01/2014] ALPRAZolam  0.5 mg Oral Q T,Th,Sa-HD  . antiseptic oral rinse  7 mL Mouth Rinse q12n4p  . budesonide (PULMICORT) nebulizer solution  0.25 mg Nebulization BID  . cefTAZidime (FORTAZ)  IV  2 g Intravenous Q T,Th,Sa-HD  . chlorhexidine  15 mL Mouth Rinse BID  . darbepoetin (ARANESP) injection - DIALYSIS  200 mcg Intravenous Q Thu-HD  . feeding supplement (VITAL AF 1.2 CAL)  1,000 mL Per Tube Q24H  . ipratropium  0.5 mg Nebulization TID  . levalbuterol  1.25 mg Nebulization TID  . levothyroxine  12.5 mcg Intravenous Daily  . metoprolol  2.5 mg Intravenous 4 times per day  . metronidazole  500 mg Intravenous Q8H  . sodium chloride  10-40 mL Intracatheter Q12H  . sodium hypochlorite   Topical BID  . vitamin C  1,000 mg Per Tube Daily   Continuous Infusions: . sodium  chloride 10 mL/hr at 08/19/14 0950  . amiodarone 30 mg/hr (08/30/14 0945)     Arica Bevilacqua, DO  [facility] Pager [phone]  If 7PM-7AM, please contact night-coverage [email or web address] Password [email or web address] 08/30/2014, 1:19 PM   LOS: 74 days

## 2014-08-30 NOTE — Progress Notes (Signed)
SUBJECTIVE: The patient has a long and complex history. She has been hospitalized since March, 2016. She has had supraventricular tachycardia. 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 Beta blockade helps some. Currently her blood pressure is on the low side and there is hesitation to give her beta blocker. The patient had a CVA during this admission. She RV dysfunction with high right heart pressures and shunting from the right to left. There has been no documented atrial fibrillation. It has been felt that amiodarone is the only drug that will definitely help with her rhythm. She was on amiodarone for a period of time. This included oral amiodarone. She could no longer take oral meds after while in the amiodarone was stopped. She has been seen by the cardiology team intermittently since March, 2016. She was seen earlier in May.  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  . sodium chloride   Intravenous Once  . [START ON 09/01/2014] ALPRAZolam  0.5 mg Oral Q T,Th,Sa-HD  . antiseptic oral rinse  7 mL Mouth Rinse q12n4p  . budesonide (PULMICORT) nebulizer solution  0.25 mg Nebulization BID  . cefTAZidime (FORTAZ)  IV  2 g Intravenous Q T,Th,Sa-HD  . chlorhexidine  15 mL Mouth Rinse BID  . darbepoetin (ARANESP) injection - DIALYSIS  200 mcg Intravenous Q Thu-HD  . feeding supplement (VITAL AF 1.2 CAL)  1,000 mL Per Tube Q24H  . ipratropium  0.5 mg Nebulization TID  . levalbuterol  1.25 mg Nebulization TID  . levothyroxine  12.5 mcg Intravenous Daily  . metoprolol  2.5 mg Intravenous 4 times per day  . metronidazole  500 mg Intravenous Q8H  . sodium chloride  10-40 mL Intracatheter Q12H  . sodium hypochlorite   Topical BID  . vitamin C  1,000 mg Per Tube Daily   . sodium chloride 10 mL/hr at 08/19/14 0950  . amiodarone 30 mg/hr (08/30/14 0945)   Filed Vitals:   08/30/14 0500 08/30/14  0600 08/30/14 0719 08/30/14 0949  BP: 107/65 108/71 107/88   Pulse: 118 107 103   Temp:   97.4 F (36.3 C)   TempSrc:   Oral   Resp: 19 25 17    Height:      Weight:      SpO2:   99% 99%     Intake/Output Summary (Last 24 hours) at 08/30/14 1127 Last data filed at 08/30/14 1004  Gross per 24 hour  Intake 2334.84 ml  Output   2235 ml  Net  99.84 ml    LABS: Basic Metabolic Panel:  Recent Labs  16/01/9604/28/16 0520  NA 140  K 3.5  CL 102  CO2 28  GLUCOSE 104*  BUN 32*  CREATININE 4.48*  CALCIUM 8.3*  PHOS 4.4   Liver Function Tests:  Recent Labs  08/29/14 0520  ALBUMIN 1.9*   No results for input(s): LIPASE, AMYLASE in the last 72 hours. CBC:  Recent Labs  08/29/14 0520 08/29/14 2008  WBC 14.3* 20.5*  HGB 9.1* 9.9*  HCT 29.3* 31.0*  MCV 94.8 93.9  PLT 303 229    PHYSICAL EXAM  the patient is oriented to person time and place. She has multiple drainage tubes. Lungs reveal scattered rhonchi. Cardiac exam reveals an S1 and S2. There is no significant peripheral edema.  TELEMETRY: I have reviewed telemetry today Aug 30, 2014. Once again she has a supraventricular tachycardia with a rate in  the range of 110-120.   ASSESSMENT AND PLAN:    Cerebral thrombosis with cerebral infarction     The patient does have pulmonary hypertension with right heart dysfunction and shunting from the right to left. So far there has been no proof of atrial fibrillation.     ESRD needing dialysis    PSVT (paroxysmal supraventricular tachycardia)     She is again having increased supraventricular tachycardia. We are not able to use any significant dose of beta blocker because of her blood pressure. I feel that it is reasonable to try amiodarone again. There was question that she might be able to take oral amiodarone. However she's had diarrhea today and she will not take any pills orally. She has an IV port that can be used.We will continue amiodarone for now. Ultimately we will want  to convert this to oral amiodarone.  As part of this evaluation today I spent greater than 60 minutes. I have carefully reviewed the record to be sure that I understand the basis of her arrhythmia and the rationale for treating it over time.Lars Masson 08/30/2014 11:27 AM

## 2014-08-30 NOTE — Progress Notes (Signed)
Patient still refuses to wear CPAP. RT will continue to monitor.

## 2014-08-31 LAB — CBC
HCT: 29.7 % — ABNORMAL LOW (ref 36.0–46.0)
Hemoglobin: 9.1 g/dL — ABNORMAL LOW (ref 12.0–15.0)
MCH: 29.4 pg (ref 26.0–34.0)
MCHC: 30.6 g/dL (ref 30.0–36.0)
MCV: 95.8 fL (ref 78.0–100.0)
Platelets: 232 10*3/uL (ref 150–400)
RBC: 3.1 MIL/uL — ABNORMAL LOW (ref 3.87–5.11)
RDW: 23.3 % — AB (ref 11.5–15.5)
WBC: 11.3 10*3/uL — AB (ref 4.0–10.5)

## 2014-08-31 LAB — CLOSTRIDIUM DIFFICILE BY PCR: Toxigenic C. Difficile by PCR: NEGATIVE

## 2014-08-31 NOTE — Progress Notes (Signed)
Dr. Arbutus Leasat and patient's nurse paged on call surgeon. No return calls at this time.

## 2014-08-31 NOTE — Progress Notes (Signed)
Occupational Therapy Treatment Patient Details Name: Jaimya C Baldini MRN: [id number] DOB: 04/26/1955 Today's Date: 08/31/2014    History of present illness Anaiya EBELIN DILLEHAY is a 59 y.o. female with a history of CKD and hypertension came to the [facility] ED on 3/16 complaining of shortness of breath and generalized weakness x 2 weeks. Admitted with acute hypoxemic respiratory failure. During hospital stay noted to have weakness of her left side. 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  She developed septic shock 4/1 with pressors weaned off 4/3.  Began CRRT and pressors restarted 4/4; Developed pelvic abcess 4/7 with pelvic drain placed 4/8.  weaned off pressors 4/9.  Runs of SVT 4/5 and 4/11. Prolonged hospital course with hypoxemia and hypotension   OT comments  Pt continues to progress steadily. Remains deconditioned, but tolerating UB bathing, grooming, toileting and transfer to chair to eat her jello in one session.  Pt eager to have some of her many lines removed and eat watermelon. Goals updated.  Follow Up Recommendations  SNF;Supervision/Assistance - 24 hour    Equipment Recommendations       Recommendations for Other Services      Precautions / Restrictions Precautions Precautions: Fall Precaution Comments: 2 abdominal drains to foley bags, G tube Restrictions Weight Bearing Restrictions: No       Mobility Bed Mobility Overal bed mobility: Needs Assistance Bed Mobility: Supine to Sit     Supine to sit: Min assist;HOB elevated     General bed mobility comments: assist to scoot hips to EOB  Transfers Overall transfer level: Needs assistance Equipment used: Rolling walker (2 wheeled) Transfers: Sit to/from [employer] Sit to Stand: Min assist Stand pivot transfers: Min assist            Balance     Sitting balance-Leahy  Scale: Good       Standing balance-Leahy Scale: Poor Standing balance comment: remains heavily reliant on UEs                   ADL Overall ADL's : Needs assistance/impaired Eating/Feeding: Set up;Sitting Eating/Feeding Details (indicate cue type and reason): pt now on clear liquids, self fed jello holding bowl with L hand Grooming: Wash/dry hands;Wash/dry face;Set up;Sitting   Upper Body Bathing: Minimal assitance;Sitting Upper Body Bathing Details (indicate cue type and reason): washed back for her and rubbed with anti itch lotion             Toilet Transfer: Minimal assistance;Stand-pivot;RW;BSC Toilet Transfer Details (indicate cue type and reason): bed to 3 in 1 Toileting- Clothing Manipulation and Hygiene: Supervision/safety;Sitting/lateral lean Toileting - Clothing Manipulation Details (indicate cue type and reason): performed pericare after urinating       General ADL Comments: Sat EOB x 5 minutes, dizzy initially.      Vision                     Perception     Praxis      Cognition   Behavior During Therapy: WFL for tasks assessed/performed Overall Cognitive Status: Impaired/Different from baseline Area of Impairment: Orientation Orientation Level: Disoriented to;Time              General Comments: Continues to readily work with therapy and get OOB.    Extremity/Trunk Assessment  Exercises     Shoulder Instructions       General Comments      Pertinent Vitals/ Pain       Pain Assessment: Faces Faces Pain Scale: Hurts little more Pain Location: abdomen Pain Descriptors / Indicators: Sore Pain Intervention(s): Monitored during session;Repositioned  Home Living                                          Prior Functioning/Environment              Frequency Min 2X/week     Progress Toward Goals  OT Goals(current goals can now be found in the care plan section)  Progress towards  OT goals: Progressing toward goals  Acute Rehab OT Goals Patient Stated Goal: to get back to normal Time For Goal Achievement: 09/14/14 Potential to Achieve Goals: Good  Plan Discharge plan remains appropriate    Co-evaluation                 End of Session Equipment Utilized During Treatment: Oxygen;Rolling walker   Activity Tolerance Patient tolerated treatment well   Patient Left in chair;with call bell/phone within reach;with nursing/sitter in room   Nurse Communication Patient requests pain meds (requesting benadryl)        Time: [id number] OT Time Calculation (min): 39 min  Charges: OT General Charges $OT Visit: 1 Procedure OT Treatments $Self Care/Home Management : 38-52 mins  [name], Stuart Mirabile Lynn 08/31/2014, 9:22 AM

## 2014-08-31 NOTE — Progress Notes (Signed)
Patient refused CPAP. RT will continue to monitor.  

## 2014-08-31 NOTE — Progress Notes (Signed)
[facility] Progress Note  Assessment/Plan:  1.  New ESRD 1. On TTS schedule as inpatient 2. No permanent access, using TDC 3. CLIP on hold as still significant inpatient issues 4. Holding on permanent access for now, AVF from 01/28/14; will need to address prior to DC 1. Last seen by [facility] 06/2014; likely needing revision 2. [facility] working well for now 5. 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 Cont with 4K bath next for rhythm prevention 7. Panic with HD: try low dose alprazolam preHD 2. Vol Status 1. Using Bed Weights currently, try to transition to standing 2. Still appears volume overload, off TNA now, also with > 1L/d loss from GI drains 3. Try for 3.5L UF this Tx  4. Having trouble achieving UF 2/2 SVT and hypotension with HD, see #10 3. Pseudomonal pelvic abscess on Ceftaz/Flagyl, hx/o C Diff 4. CVA acute by MRI 3/19 5. Perf pyloric ulcer SP exlap, w duod fistula to suction high output, s/p octreotide, CCS following 6. Nutrition w G-J tube on TF's, off TNA 7. C diff - s/p po Vanc 8. Anemia  1. aranesp to 200 qThurs 2. Finished Fe load 5/21 3. Transfused 2u PRBC 08/27/14 9. MBD - iPTH 130 5/12, low P resolved  10. Parox SVT on IV MTP q 4hrs.  Restarted Amio 5/28 11. 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 since no longer getting TNA, repeat level pend   [name] MD (pgr) [phone]    ([phone] 08/31/2014, 10:05 AM       Subjective:    HD yesterday 2L UF Seems having panic/anxiety during HD    Objective Filed Vitals:   08/31/14 0500 08/31/14 0827 08/31/14 0938 08/31/14 0943  BP: 102/58 107/81    Pulse: 101 100    Temp:  97.1 F (36.2 C)    TempSrc:  Axillary    Resp: 21 20    Height:      Weight: 97.6 kg (215 lb 2.7 oz)     SpO2: 96% 97% 99% 100%   Physical Exam General: Walking with PT Heart:tachy reg Lungs:clear to A/P now Abdomen: obese, anasarca with dependent back/buttock  edema Extremities: 1-2+ dependent edema Dialysis Access: right IJ   Dialysis Orders:  Additional Objective Labs: Basic Metabolic Panel:  Recent Labs Lab 08/25/14 0747 08/27/14 0539 08/29/14 0520  NA 139 132* 140  K 4.5 3.5 3.5  CL 100* 96* 102  CO2 GLUCOSE 90 457* 104*  BUN 34* 34* 32*  CREATININE 5.05* 4.56* 4.48*  CALCIUM 8.1* 7.8* 8.3*  PHOS 6.1* 4.4 4.4   Liver Function Tests:  Recent Labs Lab 08/25/14 0525 08/25/14 0747 08/27/14 0539 08/29/14 0520  AST 17  --  17  --   ALT 9*  --  8*  --   ALKPHOS 127*  --  108  --   BILITOT 1.3*  --  1.0  --   PROT 6.7  --  6.6  --   ALBUMIN 2.0* 2.1* 1.9* 1.9*  CBC:  Recent Labs Lab 08/27/14 0539 08/28/14 2025 08/29/14 0520 08/29/14 2008 08/31/14 0600  WBC 13.9* 15.7* 14.3* 20.5* 11.3*  HGB 6.8* 9.7* 9.1* 9.9* 9.1*  HCT 24.7* 31.0* 29.3* 31.0* 29.7*  MCV 103.8* 94.5 94.8 93.9 95.8  PLT 323 336 303 229 232   Blood Culture    Component Value Date/Time   SDES ABSCESS PELVIS 08/17/2014 1451  SPECREQUEST NONE 08/17/2014 1451   CULT  08/17/2014 1451    NO GROWTH 3 DAYS [facility]formed at [facility]    REPTSTATUS 08/21/2014 FINAL 08/17/2014 1451    Cardiac Enzymes: No results for input(s): CKTOTAL, CKMB, CKMBINDEX, TROPONINI in the last 168 hours. CBG:  Recent Labs Lab 08/24/14 1631 08/25/14 0110 08/25/14 1741 08/27/14 1218 08/27/14 1817  GLUCAP 139* 122* 124* 112* 157*    Medications: . sodium chloride 10 mL/hr at 08/19/14 0950  . amiodarone 30 mg/hr (08/30/14 2301)   . sodium chloride   Intravenous Once  . [START ON 09/01/2014] ALPRAZolam  0.5 mg Oral Q T,Th,Sa-HD  . antiseptic oral rinse  7 mL Mouth Rinse q12n4p  . budesonide (PULMICORT) nebulizer solution  0.25 mg Nebulization BID  . cefTAZidime (FORTAZ)  IV  2 g Intravenous Q T,Th,Sa-HD  . chlorhexidine  15 mL Mouth Rinse BID  . darbepoetin (ARANESP) injection - DIALYSIS  200 mcg Intravenous Q Thu-HD  . feeding supplement  (VITAL AF 1.2 CAL)  1,000 mL Per Tube Q24H  . ipratropium  0.5 mg Nebulization TID  . levalbuterol  1.25 mg Nebulization TID  . levothyroxine  12.5 mcg Intravenous Daily  . metoprolol  2.5 mg Intravenous 4 times per day  . metronidazole  500 mg Intravenous Q8H  . sodium chloride  10-40 mL Intracatheter Q12H  . sodium hypochlorite   Topical BID  . vitamin C  1,000 mg Per Tube Daily

## 2014-08-31 NOTE — Progress Notes (Signed)
Subjective:   The patient has a long and complex history. She has been hospitalized since March, 2016. She has had supraventricular tachycardia. 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 Beta blockade helps some. Currently her blood pressure is on the low side and there is hesitation to give her beta blocker. The patient had a CVA during this admission. She RV dysfunction with high right heart pressures and shunting from the right to left. There has been no documented atrial fibrillation. It has been felt that amiodarone is the only drug that will definitely help with her rhythm. She was on amiodarone for a period of time. This included oral amiodarone. She could no longer take oral meds after while in the amiodarone was stopped. She has been seen by the cardiology team intermittently since March, 2016. She was seen earlier in May.  SVT improved with AMIO  Objective:  Vital Signs in the last 24 hours: Temp:  [97.1 F (36.2 C)-97.9 F (36.6 C)] 97.1 F (36.2 C) (05/30 0827) Pulse Rate:  [100-109] 100 (05/30 0827) Resp:  [17-23] 20 (05/30 0827) BP: (95-118)/(58-81) 107/81 mmHg (05/30 0827) SpO2:  [95 %-100 %] 97 % (05/30 0827) Weight:  [215 lb 2.7 oz (97.6 kg)] 215 lb 2.7 oz (97.6 kg) (05/30 0500)  Intake/Output from previous day: 05/29 0701 - 05/30 0700 In: 2389.1 [P.O.:100; I.V.:614.1; NG/GT:1375; IV Piggyback:300] Out: 760 [Urine:50; Drains:710]   Physical Exam: General: Well developed, well nourished, sick appearing. Head:  Normocephalic and atraumatic. Lungs: Clear to auscultation and percussion. Heart: Normal S1 and S2.  No murmur, rubs or gallops.  Abdomen: soft, non-tender, positive bowel sounds. Scars noted, tubes Extremities: No clubbing or cyanosis. No edema. Neurologic: Alert and oriented x 3.    Lab Results:  Recent Labs  08/29/14 2008 08/31/14 0600  WBC 20.5* 11.3*  HGB 9.9* 9.1*  PLT 229 232    Recent Labs  08/29/14 0520  NA 140  K 3.5  CL 102    CO2 28  GLUCOSE 104*  BUN 32*  CREATININE 4.48*   No results for input(s): TROPONINI in the last 72 hours.  Invalid input(s): CK, MB Hepatic Function Panel  Recent Labs  08/29/14 0520  ALBUMIN 1.9*     Telemetry: Sinus tach at times.  Personally viewed.  Cardiac Studies:  EF 65%, RV dilated.   Assessment/Plan:  Principal Problem:   Acute respiratory failure with hypoxia Active Problems:   Cerebral thrombosis with cerebral infarction   Hypertension   ESRD needing dialysis   Obesity (BMI 30-39.9)   Depression   Left renal mass   Chronic diastolic heart failure   H1N1 influenza   Tobacco use disorder   Renal failure (ARF), acute on chronic   Hyperlipidemia   PSVT (paroxysmal supraventricular tachycardia)   SOB (shortness of breath)   Perforated gastric ulcer   Abnormal TSH   History of intermediate V/Q scan   HIT (heparin-induced thrombocytopenia)   Fistula   Abdominal pain   Palliative care encounter   ESRD (end stage renal disease)   SVT (supraventricular tachycardia)   Right heart failure   Duodenal anastomotic leak   Protein calorie malnutrition   Itching   Aortic aneurysm   Protein-calorie malnutrition   Sepsis  Scheduled Meds: . sodium chloride   Intravenous Once  . [START ON 09/01/2014] ALPRAZolam  0.5 mg Oral Q T,Th,Sa-HD  . antiseptic oral rinse  7 mL Mouth Rinse q12n4p  . budesonide (PULMICORT) nebulizer solution  0.25 mg  Nebulization BID  . cefTAZidime (FORTAZ)  IV  2 g Intravenous Q T,Th,Sa-HD  . chlorhexidine  15 mL Mouth Rinse BID  . darbepoetin (ARANESP) injection - DIALYSIS  200 mcg Intravenous Q Thu-HD  . feeding supplement (VITAL AF 1.2 CAL)  1,000 mL Per Tube Q24H  . ipratropium  0.5 mg Nebulization TID  . levalbuterol  1.25 mg Nebulization TID  . levothyroxine  12.5 mcg Intravenous Daily  . metoprolol  2.5 mg Intravenous 4 times per day  . metronidazole  500 mg Intravenous Q8H  . sodium chloride  10-40 mL Intracatheter Q12H  .  sodium hypochlorite   Topical BID  . vitamin C  1,000 mg Per Tube Daily   Continuous Infusions: . sodium chloride 10 mL/hr at 08/19/14 0950  . amiodarone 30 mg/hr (08/30/14 2301)   PRN Meds:.bisacodyl, camphor-menthol, diphenhydrAMINE, diphenhydrAMINE-zinc acetate, HYDROmorphone (DILAUDID) injection, levalbuterol, ondansetron (ZOFRAN) IV, sodium chloride  PSVT  - improved with IV AMIO  - convert to PO AMIO as able to take PO  ESRD  - HD  CVA  - no AFIB  PULM HTN  - RV dil on ECHO. Dilated RA - ? Source SVT.   [name] 08/31/2014, 8:57 AM

## 2014-08-31 NOTE — Progress Notes (Signed)
Dr. Carolynne Edouardoth returned call. Informed of situation. IR will have to replace drain to RUQ.

## 2014-08-31 NOTE — Progress Notes (Signed)
61 Days Post-Op  Subjective: No complaints  Objective: Vital signs in last 24 hours: Temp:  [97.3 F (36.3 C)-97.9 F (36.6 C)] 97.3 F (36.3 C) (05/30 0010) Pulse Rate:  [101-109] 101 (05/30 0500) Resp:  [17-23] 21 (05/30 0500) BP: (95-118)/(58-81) 102/58 mmHg (05/30 0500) SpO2:  [95 %-100 %] 96 % (05/30 0500) Weight:  [97.6 kg (215 lb 2.7 oz)] 97.6 kg (215 lb 2.7 oz) (05/30 0500) Last BM Date: 08/31/14  Intake/Output from previous day: 05/29 0701 - 05/30 0700 In: 2389.1 [P.O.:100; I.V.:614.1; NG/GT:1375; IV Piggyback:300] Out: 760 [Urine:50; Drains:710] Intake/Output this shift:    Resp: clear to auscultation bilaterally Cardio: regular rate and rhythm GI: soft, minimal tenderness. wound clean. tolerating tf well  Lab Results:   Recent Labs  08/29/14 2008 08/31/14 0600  WBC 20.5* 11.3*  HGB 9.9* 9.1*  HCT 31.0* 29.7*  PLT 229 232   BMET  Recent Labs  08/29/14 0520  NA 140  K 3.5  CL 102  CO2 28  GLUCOSE 104*  BUN 32*  CREATININE 4.48*  CALCIUM 8.3*   PT/INR No results for input(s): LABPROT, INR in the last 72 hours. ABG No results for input(s): PHART, HCO3 in the last 72 hours.  Invalid input(s): PCO2, PO2  Studies/Results: No results found.  Anti-infectives: Anti-infectives    Start     Dose/Rate Route Frequency Ordered Stop   08/27/14 1900  cefTAZidime (FORTAZ) 2 g in dextrose 5 % 50 mL IVPB     2 g 100 mL/hr over 30 Minutes Intravenous Every T-Th-Sa (Hemodialysis) 08/27/14 1846     08/27/14 1900  metroNIDAZOLE (FLAGYL) IVPB 500 mg     500 mg 100 mL/hr over 60 Minutes Intravenous Every 8 hours 08/27/14 1847     08/21/14 1430  cefTAZidime (FORTAZ) 1 g in dextrose 5 % 50 mL IVPB     1 g 100 mL/hr over 30 Minutes Intravenous  Once 08/21/14 1426 08/21/14 1547   08/18/14 1300  metroNIDAZOLE (FLAGYL) IVPB 500 mg  Status:  Discontinued     500 mg 100 mL/hr over 60 Minutes Intravenous 3 times per day 08/18/14 1045 08/27/14 1847   08/18/14 1200   cefTAZidime (FORTAZ) 2 g in dextrose 5 % 50 mL IVPB  Status:  Discontinued     2 g 100 mL/hr over 30 Minutes Intravenous Every T-Th-Sa (Hemodialysis) 08/18/14 1054 08/27/14 1846   08/12/14 1449  ceFAZolin (ANCEF) 2-3 GM-% IVPB SOLR    Comments:  Troy SineLaffan, Eileen   : cabinet override      08/12/14 1449 08/12/14 1629   08/12/14 1130  ceFAZolin (ANCEF) IVPB 2 g/50 mL premix    Comments:  Hang ON CALL to xray 5/11   2 g 100 mL/hr over 30 Minutes Intravenous To Radiology 08/12/14 1035 08/12/14 1520   08/04/14 1200  vancomycin (VANCOCIN) 50 mg/mL oral solution 125 mg  Status:  Discontinued     125 mg Oral 4 times per day 08/04/14 0851 08/08/14 1229   07/18/14 1800  vancomycin (VANCOCIN) 50 mg/mL oral solution 125 mg  Status:  Discontinued     125 mg Oral 4 times per day 07/18/14 1455 08/04/14 0845   07/17/14 1830  vancomycin (VANCOCIN) IVPB 750 mg/150 ml premix     750 mg 150 mL/hr over 60 Minutes Intravenous  Once 07/17/14 1818 07/18/14 0024   07/15/14 1400  metroNIDAZOLE (FLAGYL) tablet 500 mg  Status:  Discontinued     500 mg Oral 3 times per day 07/15/14  1202 07/18/14 1533   07/13/14 2200  piperacillin-tazobactam (ZOSYN) IVPB 2.25 g  Status:  Discontinued     2.25 g 100 mL/hr over 30 Minutes Intravenous 3 times per day 07/13/14 1318 07/24/14 1138   07/13/14 2000  fluconazole (DIFLUCAN) IVPB 200 mg  Status:  Discontinued     200 mg 100 mL/hr over 60 Minutes Intravenous Every 24 hours 07/13/14 1318 07/19/14 1027   07/07/14 1200  vancomycin (VANCOCIN) IVPB 1000 mg/200 mL premix  Status:  Discontinued     1,000 mg 200 mL/hr over 60 Minutes Intravenous Every 24 hours 07/07/14 0937 07/14/14 1712   07/06/14 2000  fluconazole (DIFLUCAN) IVPB 400 mg  Status:  Discontinued     400 mg 100 mL/hr over 120 Minutes Intravenous Every 24 hours 07/06/14 1507 07/13/14 1318   07/06/14 1800  piperacillin-tazobactam (ZOSYN) IVPB 2.25 g  Status:  Discontinued     2.25 g 100 mL/hr over 30 Minutes Intravenous 4  times per day 07/06/14 1505 07/13/14 1318   07/05/14 2000  fluconazole (DIFLUCAN) IVPB 200 mg  Status:  Discontinued     200 mg 100 mL/hr over 60 Minutes Intravenous Every 24 hours 07/05/14 0757 07/06/14 1507   07/05/14 1400  piperacillin-tazobactam (ZOSYN) IVPB 2.25 g  Status:  Discontinued     2.25 g 100 mL/hr over 30 Minutes Intravenous 3 times per day 07/05/14 0749 07/06/14 1505   07/02/14 1800  vancomycin (VANCOCIN) IVPB 1000 mg/200 mL premix  Status:  Discontinued     1,000 mg 200 mL/hr over 60 Minutes Intravenous Every 24 hours 07/01/14 1858 07/05/14 0801   07/01/14 2200  piperacillin-tazobactam (ZOSYN) IVPB 3.375 g  Status:  Discontinued     3.375 g 100 mL/hr over 30 Minutes Intravenous 4 times per day 07/01/14 1858 07/05/14 0749   07/01/14 2000  fluconazole (DIFLUCAN) IVPB 400 mg  Status:  Discontinued     400 mg 100 mL/hr over 120 Minutes Intravenous Every 24 hours 07/01/14 1858 07/05/14 0757   07/01/14 1400  fluconazole (DIFLUCAN) IVPB 200 mg  Status:  Discontinued     200 mg 100 mL/hr over 60 Minutes Intravenous Every 24 hours 07/01/14 1330 07/01/14 1853   07/01/14 1000  piperacillin-tazobactam (ZOSYN) IVPB 2.25 g  Status:  Discontinued     2.25 g 100 mL/hr over 30 Minutes Intravenous Every 8 hours 07/01/14 0952 07/01/14 1853   07/01/14 1000  vancomycin (VANCOCIN) 500 mg in sodium chloride 0.9 % 100 mL IVPB     500 mg 100 mL/hr over 60 Minutes Intravenous  Once 07/01/14 0952 07/01/14 1126   06/30/14 1200  vancomycin (VANCOCIN) IVPB 1000 mg/200 mL premix     1,000 mg 200 mL/hr over 60 Minutes Intravenous  Once 06/30/14 0944 06/30/14 1403   06/30/14 1200  ceFEPIme (MAXIPIME) 2 g in dextrose 5 % 50 mL IVPB     2 g 100 mL/hr over 30 Minutes Intravenous  Once 06/30/14 0944 06/30/14 1425   06/30/14 0100  vancomycin (VANCOCIN) 2,000 mg in sodium chloride 0.9 % 500 mL IVPB     2,000 mg 250 mL/hr over 120 Minutes Intravenous  Once 06/30/14 0048 06/30/14 0525   06/30/14 0100   ceFEPIme (MAXIPIME) 2 g in dextrose 5 % 50 mL IVPB     2 g 100 mL/hr over 30 Minutes Intravenous  Once 06/30/14 0048 06/30/14 0322   06/27/14 0600  cefUROXime (ZINACEF) 1.5 g in dextrose 5 % 50 mL IVPB     1.5 g 100  mL/hr over 30 Minutes Intravenous On call to O.R. 06/26/14 1019 06/27/14 0630   06/19/14 1800  oseltamivir (TAMIFLU) capsule 30 mg     30 mg Oral Every M-W-F (1800) 06/19/14 1003 06/22/14 1841   06/18/14 1600  oseltamivir (TAMIFLU) capsule 30 mg  Status:  Discontinued     30 mg Oral Daily 06/18/14 1538 06/19/14 1003      Assessment/Plan: s/p Procedure(s): EXPLORATORY LAPAROTOMY WITH CLOSURE OF PERFORATED PYLORIC ULCER (N/A) SMALL BOWEL RESECTION (N/A)   SOB/hypoxia/volume over load 06/17/14 Pneumoperitoneum 07/01/14 Perforated pyloric ulcer with diffuse peritonitis, ischemic necrosis of mid ileum, possibly embolic Exploratory laparotomy, evacuation of ascites, closure of perforated pyloric ulcer with omental patch, segmental resection of 14 inches of ileum with primary anastomosis Doppler evaluation of small bowel mesenteric arterial vessels.07/01/14, Dr. 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 HER last CT was on 5/20, will check on timing for repeat CT. Continue drain, tube feeds and dressing changes.   LOS: 74 days    LOS: 75 days    TOTH III,PAUL S 08/31/2014

## 2014-08-31 NOTE — Progress Notes (Signed)
PROGRESS NOTE  [name] ZOX:[id number] DOB: 06-20-1955 DOA: 06/17/2014 PCP: [name], MD   Brief Narrative: 59 yo ? smoker with hx HTN, COPD, CKD IV followed @ [facility]-[name] Page, failed L AV fistula, initially admitted 3/16 with flu and acute hypoxic resp failure. Ultimately tx to [facility] due to need for HD. L sided weakness 3/18 and found to have R ACA stroke-showed acute infarct R ACA (3/19). Patient more lethargic 3/30 and CT abd=perf viscus 3/30. She was taken to the OR for ex lap for perforated pyloric ulcer with diffuse peritonitis, ischemic necrosis of mid ileum. Patient developed septic shock on 4-1, she was started on pressors and wean off pressors on 4-3. Subsequently, she was diagnosed with pelvic abscess by CT scan perform on 4-7. She underwent pelvic drain placement by IR on 4/8= culture neg 08/15/14 another drained placed in the pelvic cul-de-sac abscess with culture = pseudomonas. On 08/20/14, Gluteal drain was inadvertently pulled out. Repeat CT scan shows improvement in the fluid collection so decision was to leave it out and continue IV abx. Pt hospital stay also complicated by high output duodenal (enterocutaneous fistula) fistula. As a result, pt was made npo and started on TPN. Now stable, on TNA + very high output from fistula/leak per [facility]. Had a G_J tube placed by Dr. [name] of IR and Gen surgery has continued to assist with management of her multiple surgical comorbidities C. diff diagnosed 4-12-initially unresponsive to flagyl, and she was started on PO vancomycin 4-16.  06/24/14 Develops run of SVT, atrial tachycardia. Cardiology was helping with management. Course complicated with high RV pressure and Cor Pulmonale; 4/27 CT angio negative for PE. On 5/2 she developed hypoxemia, hypotension and SVT during dialysis treatment. Dialysis was stopped. She received IV bolus. She was started on BIPAP. She was transferred to Step down unit. PCCM and cardiology re  consulted. Her Hr decreased, BP improved. She was taken off BIPAP. This recurred on 5/15 with episodic SVT. Since then she has had intermitten short episodes of SVT, but in past 24 hours she has had more sustained SVT, therefore; cardiology was reconsulted on on 08/29/14.  Assessment/Plan: Status post exploratory laparotomy with closure of perforated pyloric ulcer/abdominal abscess / small bowel resection w/ postop ileus and now with enterocutaneous fistula. Pelvic abscess s/p percutaneous drain placement 4/8; had leakage from anastomosis site. Received Zosyn for 23 days. Chronic low grade leukocytosis range 13-15 -IV antibiotics were discontinued by Surgery on 4/22. Also received 18 days of Vancomycin and fluconazole.  -Due to Fistula, pt is back to NPO state, TPN was started -Fistula study done shows JP drain connection to the duodenal bulb-5-02. - 08/15/2014 repeat CT abdomen with 3.0 x 3.5 x 5.7 cm collection in the pelvic cul-de-sac and extraluminal air alongside JP drain in anterior abd wall -continues to have high output from Fistula/leak. Had G_J placed by IR 08/12/14 at request of gen surgery -CT repeat shows 5 cm post cul-de-sac abscess which was drained on 5/16-culture revealed pseudomonas aeruginosa -ID was consulted as patient had Pseudomonas growing from her culture wounds and they started back Flagyl as well as Elita Quick on 5/16 and are following and we await final growth from her new abscess drained on 5/16. -Per ID recommendation--plan D#14/21 daysFortaz and D#14/ 28 days metronidazole -Patient inadvertently pulled out her gluteal/cul-de-sac drain May 19. Seen by interventional radiology and they recommended repeat CT scan, which was done. Shows that the fluid  collection has decreased in size. They do not plan to replace the drain at this time. Continue with antibiotics alone.  -08/21/2014 CT scan did suggest a transmural defect in the stomach wall near the patient's pyloric Cheree DittoGraham patch.  We will let surgery address this. -Patient accidentally pulled out her JP drain on 5/22. This has been replaced by surgery on 5/23. -Octreotide was discontinued 5/23--enterocutaneous fistula output appears to be decreasing with 1000 mL in the past 24 hours -finished TPN on 08/27/14 -Enteral Feeding was increased 5/27 to 55 mL per hour--tolerating without residuals -08/29/14--started clear liquids -08/30/14--increased WBC-->check Cdiff -08/31/14--pt inadvertently pulled out R-side JP drain again during therapy + GJ tube with bloody drainage--contacted general surgery  PSVT --Atrial tachycardia -Felt to be secondary to severe anxiety with dialysis as well as COPD/Cor Pulmonale  -IV metoprolol use limited by pt's soft BP -appreciate Cardiology-->started IV amiodarone 5/28 -V-Q scan with intermediate probability for pulmonary embolism.  -CTA 4/27, negative for PE  -Chest pain on 5/22, likely related to the fact that JP drain was accidentally pulled out, which resulted in severe abdominal pain. Chest x-ray did not show any concerning findings. EKG was nonischemic. Troponins are unremarkable. Don't anticipate further workup unless she has recurrence of her symptoms. --HR improving on amiodarone -agree with switch to po amiodarone--she is able to take clear liquids  ESRD new, now on HD this admit complicated by intermittent hypotension AKI on CKD 4, due to sepsis, shock Ongoing hemodialysis per Nephrology - perm-cath placed 3/26 d/t AVF problems Patient AV fistula reviewed and not mature yet per Dr. Paulene FloorFields-will need to revisit prior to d/c -TPN weaned off 08/27/14 -Continue hemodialysis Tuesday, Thursday, Saturday while the patient is in the hospital -Xanax with each HD due to significant anxiety with HD  C. difficile colitis still on contact precautions -C diff positive on 4-12, Received 4 days of flagyl without improvement, then started on PO vanc -Completed 3 weeks of Oral Vanc-stopped 5/7,  this was 1 week after IV Zosyn was stopped 5/5 -5/28--having loose stools again-->Cdiff PCR -keep on contact precautions  ACA CVA with Lt sided weakness -06/23/14--TEE ;normal LV function; no LAA thrombus -Started on ASA 81mg  per Neuro -Will need ongoing long term rehab - PT/OT to cont to follow while inpatient -Patient remains very weak and deconditioned. 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 Fluids/volume managed with hemodialysis.  Elevated PA pressures on 2-D echo; CTA negative for PE - 07/23/2014 echo-- EF 60-61%, grade 1 diastolic dysfunction, Severe RV dilatation, PAP 90  Small PFO Has been evaluated by Cardiology. No intervention planned currently.  Anemia of critical illness/chronic disease -and bleeding from leak/fistula which has resolved. -s/p 2U PRBC 4/13 and 5/10 and 5/26 Aranesp/iron per renal -transfuse for hemoglobin less than 7  Acute hypoxic Respiratory Failure:  -This was secondary to H1N1, hypervolemia, HCAP >> resolved.  -also has COPD and Cor Pulmonale - patient complained of some increased shortness of breath today-- wheezing on exam - start scheduled Xopenex and Atrovent - presently stable on 2 L-3L Thrombocytopenia; resolved.  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 On TPN now, CBGs stabilizedin 120-160s.  Hba1c 6.5 -08/27/14--question accuracy of CBG of 457 on BMP this am--likely contaminated with TPN  Abnormal TSH -Free T4 WNL, T3 1.8 -most likely sick thyroid with ongoing infection and body stress; also due to amiodarone use. TSH in 7 range -continue low dose synthroid given PSVT, now on IV synthroid   L Renal mass  -noted on [place], Indeterminate 1.9 cm hypoechoic mass off the interpolar region of the left kidney -may potentially represent a cyst however solid mass is not excluded -needs outpatient FU for this  Obesity Inadequate oral intake related to altered GI function as evidenced by limited intake, ongoing.  Body mass index is 35.03 kg/(m^2).  Fall on May 18 Examination did not reveal any injuries.   DVT prophylaxis: SCDs Code Status: FULL Family Communication: Discussed with patient. No family at bedside. Disposition Plan: Have been unable to transfer patient out of stepdown unit due to dyspnea and tachycardia. She also has complex wound care needs. Dialysis is ongoing. Not ready for discharge.  Consultants: Cardiology Nephrology PCCM Gen Surgery  Palliative care     Procedures/Studies: Ct Abdomen Pelvis Wo Contrast  08/22/2014   CLINICAL DATA:  Abdominal abscess. No intravenous contrast due to renal failure.  EXAM: CT ABDOMEN AND PELVIS WITHOUT CONTRAST  TECHNIQUE: Multidetector CT imaging of the abdomen and pelvis was performed following the standard protocol without IV contrast.  COMPARISON:  08/15/2014  FINDINGS: BODY WALL: Open laparotomy without fluid collection.  LOWER CHEST: Chronic triangular opacity in the posterior costophrenic sulcus on the left, atelectasis versus scarring  ABDOMEN/PELVIS:  Liver: No focal abnormality.  Biliary: High-density material within the gallbladder showing mild wall thickening which is chronic.  Pancreas: Unremarkable.  Spleen: Unremarkable.  Adrenals: Unremarkable.  Kidneys and ureters: No hydronephrosis or stone. Presumed 1 cm cyst  from the interpolar left kidney  Bladder: Decompressed.  No pathologic findings.  Reproductive: Calcified and noncalcified uterine fibroids, better seen on previous enhanced imaging.  Bowel: Right upper quadrant drain related to perforated pyloric ulcer status post Cheree Ditto patch. Specially visible on coronal imaging, there is a gas channel through the pylorus measuring 6 to 9 mm in diameter. No bowel obstruction. Enteroenterostomy noted in the pelvis. Oral contrast again seen in the colon. No notable colonic wall thickening in this patient with history of C difficile. Negative appendix. Percutaneous gastrojejunostomy tube is in good position.  Retroperitoneum: No mass or adenopathy.  Peritoneum: Known thick walled collection in the rectovaginal recess smaller than 08/15/2014 at 40 x 16 mm as compared to 51 x 32 mm. No new collection is identified. No free pneumoperitoneum.  Vascular: 3 cm fusiform infrarenal aortic aneurysm.  OSSEOUS: No acute abnormalities.  IMPRESSION: 1. Near the patient's pyloric Cheree Ditto patch is a transmural defect in the stomach wall, contiguous with the surgical drain. If repeat surgery is considered, oral contrast could confirm an open ulcer. 2. Decreased rectovaginal recess fluid collection, now 4 x 1.5 cm (previously 5 x 3 cm).   Electronically Signed   By: Marnee Spring M.D.   On: 08/22/2014 01:49   Ct Abdomen Pelvis Wo Contrast  08/15/2014   CLINICAL DATA:  Intra abdominal abscess.  EXAM: CT ABDOMEN AND PELVIS WITHOUT CONTRAST  TECHNIQUE: Multidetector CT imaging of the abdomen and pelvis was performed following the standard protocol without IV contrast.  COMPARISON:  CT scan of Aug 03, 2014.  FINDINGS: Severe degenerative disc disease is noted at L5-S1. Minimal subsegmental atelectasis is noted posteriorly in the left lung base.  No gallstones are noted. No focal abnormality is noted in the liver, spleen or pancreas on these unenhanced images. Adrenal glands appear normal. Stable  exophytic cyst is seen arising from midpole of left kidney. No hydronephrosis or renal obstruction is noted. No renal or ureteral calculi are noted. 3 cm infrarenal abdominal aortic aneurysm is noted. Gastrojejunostomy tube is seen entering stomach percutaneously and with distal tip in the jejunum. JP surgical drain is seen entering right upper quadrant of abdomen underneath the liver with distal tip in the left upper quadrant. No significant fluid collection is noted around the drain. Fluid collection is again identified and posterior cul-de-sac of pelvis measuring 5.1 x 3.2 cm. Calcified degenerating fibroid is noted in the uterus. Urinary bladder is unremarkable. There is no evidence of bowel obstruction. The appendix appears normal. No significant adenopathy is noted. Large midline surgical anterior abdominal wall incision is noted.  IMPRESSION: Stable 3 cm infrarenal abdominal aortic aneurysm.  No change in position of JP surgical drain with distal tip in the left upper quadrant of the abdomen.  Interval placement of percutaneous gastrojejunostomy tube with distal tip in the jejunum.  Grossly stable size and appearance of 5 cm fluid collection seen in posterior cul-de-sac of the pelvis.  Continued presence uterine fibroids.   Electronically Signed   By: Lupita Raider, M.D.   On: 08/15/2014 15:15   Dg Chest 1 View  08/13/2014   CLINICAL DATA:  Shortness of breath  EXAM: CHEST  1 VIEW  COMPARISON:  08/03/2014  FINDINGS: Moderately severe cardiac enlargement. Central line in unchanged position on the right and left side. Mild vascular congestion with no evidence of edema effusion or consolidation.  IMPRESSION: No acute findings   Electronically Signed   By: Esperanza Heir M.D.   On: 08/13/2014 11:04   Dg Abd 1 View  08/03/2014   CLINICAL DATA:  NG tube placement  EXAM: ABDOMEN - 1 VIEW  COMPARISON:  07/13/2014  FINDINGS: NG tube tip is in the proximal stomach. The side port is near the GE junction.   IMPRESSION: NG tube tip in the proximal stomach.   Electronically Signed   By: Charlett Nose M.D.   On: 08/03/2014 17:18   Ct Abdomen Pelvis W Contrast  08/04/2014   CLINICAL DATA:  New epigastric abdominal pain.  EXAM: CT ABDOMEN AND PELVIS WITH CONTRAST  TECHNIQUE: Multidetector CT imaging of the abdomen and pelvis was performed using the standard protocol following bolus administration of intravenous contrast.  CONTRAST:  OMNIPAQUE IOHEXOL 300 MG/ML  SOLN  COMPARISON:  07/16/2014  FINDINGS: There is a small volume extraluminal air alongside the JP drain in the anterior abdominal midline. Source of the air is not clear. No other extraluminal air is evident in the abdomen or pelvis. No undrained fluid collection is evident alongside the JP drain. There is a 3.5 x 5.7 x 3.0 cm contained collection in the pelvic cul-de-sac which could represent a small abscess.  There is an open midline abdominal wound without associated drainable collection. There is herniation of abdominal fat through the abdominal wall musculature at the location of the wound.  There are normal appearances of the liver, spleen, pancreas, adrenals and kidneys with the exception of a 1.8 cm simple left renal cyst. There is an unchanged 3 cm infrarenal  abdominal aortic aneurysm. The duodenum is draped over the aneurysm but appears intact.  There are multiple uterine fibroids.  There is no significant abnormality in the lower chest.  IMPRESSION: *Extraluminal air alongside the JP drain in the anterior abdominal midline, etiology not apparent on this scan *3.0 x 3.5 x 5.7 cm collection in the pelvic cul-de-sac *Unchanged 3 cm infrarenal abdominal aortic aneurysm directly adjacent to the duodenum *Open wound in the abdominal midline. No fluid collection near the wound.   Electronically Signed   By: Ellery Plunk M.D.   On: 08/04/2014 02:24   Ir Gastrostomy Tube Mod Sed  08/12/2014   CLINICAL DATA:  Perforated duodenal ulcer, post g patch  with persistent leak. Percutaneous gastrojejunostomy as requested for gastric decompression and small bowel enteral feeding support.  EXAM: PERC PLACEMENT GASTROSTOMY;  CONVERT G-TUBE TO G-JTUBE  FLUOROSCOPY TIME:  16 minutes 24 seconds, 1016.6 mGy  TECHNIQUE: The procedure, risks, benefits, and alternatives were explained to the patient. Questions regarding the procedure were encouraged and answered. The patient understands and consents to the procedure. As antibiotic prophylaxis, cefazolin 2 g was ordered pre-procedure and administered intravenously within one hour of incision. . A 5 French angiographic catheter was placed as orogastric tube. The upper abdomen was prepped with Betadine, draped in usual sterile fashion, and infiltrated locally with 1% lidocaine. 1 mg glucagon was administered intravenously to facilitate gastric distention and slow peristalsis. Stomach was insufflated using air through the orogastric tube. An 63 French sheath needle was advanced percutaneously into the gastric lumen under fluoroscopy. Gas could be aspirated and a small contrast injection confirmed intraluminal spread. The sheath was exchanged over a guidewire for a 9 [place] vascular sheath, through which the snare device was advanced and used to snare a guidewire passed through the orogastric tube. This was withdrawn, and the snare attached to the 20 French pull-through gastrostomy tube, which was advanced antegrade, positioned with the internal bumper securing the anterior gastric wall to the anterior abdominal wall. Small contrast injection confirms appropriate positioning. The external bumper was applied and the catheter was flushed.  A 5 French angiographic catheter was placed coaxially through the gastrostomy tube and used to direct an angled stiff glidewire across the pylorus, beyond the proximal duodenal perforation, and into the proximal jejunum just beyond the ligament of Treitz. Over this, a coaxial jejunostomy feeding  adapter was advanced. Contrast injection confirmed patency and appropriate position of the gastric and jejunal lumens. No extravasation. The lumens were flushed with saline and capped. The patient tolerated the procedure well.  COMPLICATIONS: COMPLICATIONS none  IMPRESSION: 1. Technically successful 20 French pull-through gastrostomy placement under fluoroscopy. 2. Technically successful coaxial jejunostomy feeding adapter placement to the ligament of Treitz.   Electronically Signed   By: Corlis Leak M.D.   On: 08/12/2014 16:12   Ir Adele Schilder Tamsen Snider Convert Gastr-jej Per W/fl Mod Sed  08/12/2014   CLINICAL DATA:  Perforated duodenal ulcer, post g patch with persistent leak. Percutaneous gastrojejunostomy as requested for gastric decompression and small bowel enteral feeding support.  EXAM: PERC PLACEMENT GASTROSTOMY;  CONVERT G-TUBE TO G-JTUBE  FLUOROSCOPY TIME:  16 minutes 24 seconds, 1016.6 mGy  TECHNIQUE: The procedure, risks, benefits, and alternatives were explained to the patient. Questions regarding the procedure were encouraged and answered. The patient understands and consents to the procedure. As antibiotic prophylaxis, cefazolin 2 g was ordered pre-procedure and administered intravenously within one hour of incision. . A 5 French angiographic catheter was placed as orogastric tube.  The upper abdomen was prepped with Betadine, draped in usual sterile fashion, and infiltrated locally with 1% lidocaine. 1 mg glucagon was administered intravenously to facilitate gastric distention and slow peristalsis. Stomach was insufflated using air through the orogastric tube. An 44 French sheath needle was advanced percutaneously into the gastric lumen under fluoroscopy. Gas could be aspirated and a small contrast injection confirmed intraluminal spread. The sheath was exchanged over a guidewire for a 9 [place] vascular sheath, through which the snare device was advanced and used to snare a guidewire passed through the  orogastric tube. This was withdrawn, and the snare attached to the 20 French pull-through gastrostomy tube, which was advanced antegrade, positioned with the internal bumper securing the anterior gastric wall to the anterior abdominal wall. Small contrast injection confirms appropriate positioning. The external bumper was applied and the catheter was flushed.  A 5 French angiographic catheter was placed coaxially through the gastrostomy tube and used to direct an angled stiff glidewire across the pylorus, beyond the proximal duodenal perforation, and into the proximal jejunum just beyond the ligament of Treitz. Over this, a coaxial jejunostomy feeding adapter was advanced. Contrast injection confirmed patency and appropriate position of the gastric and jejunal lumens. No extravasation. The lumens were flushed with saline and capped. The patient tolerated the procedure well.  COMPLICATIONS: COMPLICATIONS none  IMPRESSION: 1. Technically successful 20 French pull-through gastrostomy placement under fluoroscopy. 2. Technically successful coaxial jejunostomy feeding adapter placement to the ligament of Treitz.   Electronically Signed   By: Corlis Leak M.D.   On: 08/12/2014 16:12   Dg Chest Port 1 View  08/23/2014   CLINICAL DATA:  Left chest pain  EXAM: PORTABLE CHEST - 1 VIEW  COMPARISON:  08/18/2014  FINDINGS: Patient is rotated.  Lungs are essentially clear. No focal consolidation. No pleural effusion or pneumothorax.  Cardiomegaly.  Left IJ venous catheter terminates at the cavoatrial junction. Right IJ dual lumen dialysis catheter terminates in the lower SVC.  IMPRESSION: No evidence of acute cardiopulmonary disease.   Electronically Signed   By: Charline Bills M.D.   On: 08/23/2014 19:17   Dg Chest Port 1 View  08/18/2014   CLINICAL DATA:  Shortness of breath at rest.  Having hemodialysis.  EXAM: PORTABLE CHEST - 1 VIEW  COMPARISON:  08/15/2014.  FINDINGS: The cardiac silhouette remains mildly enlarged.  Stable right jugular double-lumen catheter. Clear lungs. The pulmonary vasculature remains prominent. No pleural fluid. Minimal right shoulder degenerative changes.  IMPRESSION: Stable cardiomegaly and pulmonary vascular congestion.   Electronically Signed   By: Beckie Salts M.D.   On: 08/18/2014 09:47   Dg Chest Port 1 View  08/15/2014   CLINICAL DATA:  Shortness of breath.  EXAM: PORTABLE CHEST - 1 VIEW  COMPARISON:  08/13/2014 and multiple prior chest radiographs  FINDINGS: Cardiomegaly and pulmonary vascular congestion again noted.  A right IJ central venous catheter is noted with tips overlying the upper and mid SVC.  A left central venous catheter is present with tip overlying the lower SVC.  There is no evidence of focal airspace disease, pulmonary edema, suspicious pulmonary nodule/mass, pleural effusion, or pneumothorax. No acute bony abnormalities are identified.  IMPRESSION: Cardiomegaly with pulmonary vascular congestion.   Electronically Signed   By: Harmon Pier M.D.   On: 08/15/2014 17:28   Dg Chest Port 1v Same Day  08/03/2014   CLINICAL DATA:  59 year old female dialysis patient with severe shortness of breath. Respiratory distress. Initial encounter.  EXAM: PORTABLE  CHEST - 1 VIEW SAME DAY  COMPARISON:  Chest CTA 07/29/2014 and earlier.  FINDINGS: Portable AP semi upright view at 1400 hrs. Bilateral IJ approach chest catheters appears stable. Stable lung volumes. Stable cardiomegaly and mediastinal contours. No pneumothorax, pulmonary edema, pleural effusion or consolidation. No acute pulmonary opacity identified.  IMPRESSION: No acute cardiopulmonary abnormality.   Electronically Signed   By: Odessa Fleming M.D.   On: 08/03/2014 14:18   Dg Abd Portable 1v  08/23/2014   CLINICAL DATA:  JP drain, broken, initial encounter  EXAM: PORTABLE ABDOMEN - 1 VIEW  COMPARISON:  CT abdomen pelvis dated 08/21/2014  FINDINGS: A portion of the right flank is excluded from this single image.  The patient's right  abdominal surgical drain is not visualized.  Gastrojejunostomy in satisfactory position.  IMPRESSION: The patient's right abdominal surgical drain is not visualized.  Gastrojejunostomy in satisfactory position.   Electronically Signed   By: Charline Bills M.D.   On: 08/23/2014 19:11   Ct Image Guided Fluid Drain By Catheter  08/17/2014   CLINICAL DATA:  Postoperative intra-abdominal abscess, subsequent encounter. Small pelvic fluid collection.  EXAM: CT-GUIDED DRAIN PLACEMENT IN PELVIC FLUID COLLECTION  Physician: Rachelle Hora. Lowella Dandy, MD  FLUOROSCOPY TIME:  None  MEDICATIONS: 50 mcg fentanyl  ANESTHESIA/SEDATION: Patient was monitored by a radiology nurse during the procedure.  PROCEDURE: Informed consent was obtained for drain placement. Patient was placed on her left side. Images through the pelvis were obtained. The right buttock was prepped and draped in sterile fashion. 1% lidocaine was used for a local anesthetic. An 18 gauge needle was directed into the pelvic fluid collection with CT guidance from a transgluteal approach. Thick yellow purulent fluid was aspirated. A stiff Amplatz wire was placed. The tract was dilated to accommodate a 10.2 [place] multipurpose drain. It was technically difficult to advance the drain into the collection due to the small size of the collection and the patient's body habitus. Eventually, the drain was successfully placed within the collection. 5 mL of yellow purulent fluid was obtained. Catheter was sutured to the skin and attached to a suction bulb. Fluid was sent for culture.  FINDINGS: Small fluid collection just posterior to the uterus. A total of 5 mL of yellow purulent fluid was removed.  Estimated blood loss: Minimal  COMPLICATIONS: None  IMPRESSION: CT-guided placement of a drainage catheter in the small pelvic fluid collection.   Electronically Signed   By: Richarda Overlie M.D.   On: 08/17/2014 17:21         Subjective: Pt pulled out R-side JP drain again during  therapy.  Pt c/o LUQ abd pain unchanged.  Denies any f/c, n/v/d.  Abdominal pain is about the same.    Objective: Filed Vitals:   08/31/14 0827 08/31/14 0938 08/31/14 0943 08/31/14 1225  BP: 107/81   101/60  Pulse: 100   95  Temp: 97.1 F (36.2 C)   97.8 F (36.6 C)  TempSrc: Axillary   Oral  Resp: 20   20  Height:      Weight:      SpO2: 97% 99% 100% 95%    Intake/Output Summary (Last 24 hours) at 08/31/14 1442 Last data filed at 08/31/14 1400  Gross per 24 hour  Intake 2443.18 ml  Output   1025 ml  Net 1418.18 ml   Weight change: 1.8 kg (3 lb 15.5 oz) Exam:   General:  Pt is alert, follows commands appropriately, not in acute distress  HEENT:  No icterus, No thrush, [place]/AT  Cardiovascular: irregular, S1/S2, no rubs, no gallops  Respiratory: bilateral scattered rales.  Minimal basilar wheeze.    Abdomen: Soft/+BS, LUQ, RUQ tender without rebound, non distended, no guarding  Extremities: trace LE edema, No lymphangitis, No petechiae, No rashes, no synovitis  Data Reviewed: Basic Metabolic Panel:  Recent Labs Lab 08/25/14 0525 08/25/14 0747 08/27/14 0539 08/29/14 0520  NA 138 139 132* 140  K 4.5 4.5 3.5 3.5  CL 101 100* 96* 102  CO2 28 27 27 28   GLUCOSE 83 90 457* 104*  BUN 34* 34* 34* 32*  CREATININE 5.10* 5.05* 4.56* 4.48*  CALCIUM 8.3* 8.1* 7.8* 8.3*  MG  --   --  1.7  --   PHOS  --  6.1* 4.4 4.4   Liver Function Tests:  Recent Labs Lab 08/25/14 0525 08/25/14 0747 08/27/14 0539 08/29/14 0520  AST 17  --  17  --   ALT 9*  --  8*  --   ALKPHOS 127*  --  108  --   BILITOT 1.3*  --  1.0  --   PROT 6.7  --  6.6  --   ALBUMIN 2.0* 2.1* 1.9* 1.9*   No results for input(s): LIPASE, AMYLASE in the last 168 hours. No results for input(s): AMMONIA in the last 168 hours. CBC:  Recent Labs Lab 08/27/14 0539 08/28/14 2025 08/29/14 0520 08/29/14 2008 08/31/14 0600  WBC 13.9* 15.7* 14.3* 20.5* 11.3*  HGB 6.8* 9.7* 9.1* 9.9* 9.1*  HCT 24.7* 31.0*  29.3* 31.0* 29.7*  MCV 103.8* 94.5 94.8 93.9 95.8  PLT 323 336 303 229 232   Cardiac Enzymes: No results for input(s): CKTOTAL, CKMB, CKMBINDEX, TROPONINI in the last 168 hours. BNP: Invalid input(s): POCBNP CBG:  Recent Labs Lab 08/24/14 1631 08/25/14 0110 08/25/14 1741 08/27/14 1218 08/27/14 1817  GLUCAP 139* 122* 124* 112* 157*    No results found for this or any previous visit (from the past 240 hour(s)).   Scheduled Meds: . sodium chloride   Intravenous Once  . [START ON 09/01/2014] ALPRAZolam  0.5 mg Oral Q T,Th,Sa-HD  . antiseptic oral rinse  7 mL Mouth Rinse q12n4p  . budesonide (PULMICORT) nebulizer solution  0.25 mg Nebulization BID  . cefTAZidime (FORTAZ)  IV  2 g Intravenous Q T,Th,Sa-HD  . chlorhexidine  15 mL Mouth Rinse BID  . darbepoetin (ARANESP) injection - DIALYSIS  200 mcg Intravenous Q Thu-HD  . feeding supplement (VITAL AF 1.2 CAL)  1,000 mL Per Tube Q24H  . ipratropium  0.5 mg Nebulization TID  . levalbuterol  1.25 mg Nebulization TID  . levothyroxine  12.5 mcg Intravenous Daily  . metoprolol  2.5 mg Intravenous 4 times per day  . metronidazole  500 mg Intravenous Q8H  . sodium chloride  10-40 mL Intracatheter Q12H  . vitamin C  1,000 mg Per Tube Daily   Continuous Infusions: . sodium chloride 10 mL/hr at 08/19/14 0950  . amiodarone 30 mg/hr (08/31/14 1022)     Arrion Burruel, DO  [facility] Pager [phone]  If 7PM-7AM, please contact night-coverage [email or web address] Password [email or web address] 08/31/2014, 2:42 PM   LOS: 75 days

## 2014-08-31 NOTE — Progress Notes (Signed)
Spoke with Dr. Carolynne Edouardoth about pt pulling out RUQ drain. He states not an emergency but IR will have to address possibly replacing the drain.  I will contact the am rounding surgical team to inform them about situation.  As this is a surgical/anatomical issue, I do not feel comfortable discussing with IR directly.  Will facilitate surgical team discussion with IR in am.  DTat

## 2014-08-31 NOTE — Progress Notes (Signed)
Dr. Arbutus Leasat in room spoke to him about tube being out. Informed to notify surgery. Dr. Arbutus Leasat paged surgery.

## 2014-08-31 NOTE — Progress Notes (Signed)
Patient called out to go back to bed. Nursing went into room to assist patient. Noted that drainage tube on floor. Patient assisted back to bed. No bleeding/drainage noted from site. Area cleaned and dry 2x2 applied. Dressing clean dry and intact at this time.

## 2014-09-01 ENCOUNTER — Inpatient Hospital Stay (HOSPITAL_COMMUNITY): Payer: Medicaid Other

## 2014-09-01 DIAGNOSIS — K316 Fistula of stomach and duodenum: Secondary | ICD-10-CM | POA: Insufficient documentation

## 2014-09-01 LAB — CBC
HEMATOCRIT: 28.7 % — AB (ref 36.0–46.0)
Hemoglobin: 9 g/dL — ABNORMAL LOW (ref 12.0–15.0)
MCH: 29.4 pg (ref 26.0–34.0)
MCHC: 31.4 g/dL (ref 30.0–36.0)
MCV: 93.8 fL (ref 78.0–100.0)
Platelets: 252 10*3/uL (ref 150–400)
RBC: 3.06 MIL/uL — ABNORMAL LOW (ref 3.87–5.11)
RDW: 22.8 % — AB (ref 11.5–15.5)
WBC: 11.1 10*3/uL — ABNORMAL HIGH (ref 4.0–10.5)

## 2014-09-01 LAB — BASIC METABOLIC PANEL
Anion gap: 19 — ABNORMAL HIGH (ref 5–15)
BUN: 43 mg/dL — AB (ref 6–20)
CALCIUM: 8.2 mg/dL — AB (ref 8.9–10.3)
CO2: 28 mmol/L (ref 22–32)
Chloride: 95 mmol/L — ABNORMAL LOW (ref 101–111)
Creatinine, Ser: 5.72 mg/dL — ABNORMAL HIGH (ref 0.44–1.00)
GFR calc Af Amer: 9 mL/min — ABNORMAL LOW (ref >60)
GFR, EST NON AFRICAN AMERICAN: 7 mL/min — AB (ref >60)
GLUCOSE: 104 mg/dL — AB (ref 65–99)
POTASSIUM: 3.7 mmol/L (ref 3.5–5.1)
Sodium: 142 mmol/L (ref 135–145)

## 2014-09-01 LAB — VITAMIN C: Vitamin C: 2.6 mg/dL — ABNORMAL HIGH (ref 0.2–1.5)

## 2014-09-01 MED ORDER — ALTEPLASE 2 MG IJ SOLR
2.0000 mg | Freq: Once | INTRAMUSCULAR | Status: AC | PRN
  Filled 2014-09-01: qty 2

## 2014-09-01 MED ORDER — LIDOCAINE-PRILOCAINE 2.5-2.5 % EX CREA
1.0000 "application " | TOPICAL_CREAM | CUTANEOUS | Status: DC | PRN
  Filled 2014-09-01: qty 5

## 2014-09-01 MED ORDER — ALTEPLASE 100 MG IV SOLR
4.2000 mg | Freq: Once | INTRAVENOUS | Status: AC
  Administered 2014-09-01: 4 mg
  Filled 2014-09-01: qty 4

## 2014-09-01 MED ORDER — ALBUMIN HUMAN 25 % IV SOLN
INTRAVENOUS | Status: AC
  Administered 2014-09-01: 25 g
  Filled 2014-09-01: qty 100

## 2014-09-01 MED ORDER — METOPROLOL TARTRATE 12.5 MG HALF TABLET
12.5000 mg | ORAL_TABLET | Freq: Two times a day (BID) | ORAL | Status: DC
  Administered 2014-09-01 – 2014-09-09 (×11): 12.5 mg via ORAL
  Filled 2014-09-01 (×20): qty 1

## 2014-09-01 MED ORDER — PENTAFLUOROPROP-TETRAFLUOROETH EX AERO: 1.0000 "application " | INHALATION_SPRAY | CUTANEOUS | Status: DC | PRN

## 2014-09-01 MED ORDER — SODIUM CHLORIDE 0.9 % IV SOLN: 100.0000 mL | INTRAVENOUS | Status: DC | PRN

## 2014-09-01 MED ORDER — NEPRO/CARBSTEADY PO LIQD: 237.0000 mL | ORAL | Status: DC | PRN

## 2014-09-01 MED ORDER — LIDOCAINE HCL (PF) 1 % IJ SOLN: 5.0000 mL | INTRAMUSCULAR | Status: DC | PRN

## 2014-09-01 MED ORDER — AMIODARONE HCL 200 MG PO TABS
400.0000 mg | ORAL_TABLET | Freq: Two times a day (BID) | ORAL | Status: DC
  Administered 2014-09-01 – 2014-09-11 (×19): 400 mg via ORAL
  Filled 2014-09-01 (×21): qty 2

## 2014-09-01 MED ORDER — LIDOCAINE HCL 1 % IJ SOLN
INTRAMUSCULAR | Status: AC
  Filled 2014-09-01: qty 20

## 2014-09-01 MED ORDER — IOHEXOL 300 MG/ML  SOLN
50.0000 mL | Freq: Once | INTRAMUSCULAR | Status: AC | PRN
  Administered 2014-09-01: 15 mL

## 2014-09-01 NOTE — Progress Notes (Signed)
Patient refused CPAP. RT will continue to monitor.  

## 2014-09-01 NOTE — Progress Notes (Signed)
PROGRESS NOTE  [name] [name]:[id number] DOB: Sep 18, 1955 DOA: 06/17/2014 PCP: [name], MD  Brief Narrative: 59 yo ? smoker with hx HTN, COPD, CKD IV followed @ [facility]-[name] Page, failed L AV fistula, initially admitted 3/16 with flu and acute hypoxic resp failure. Ultimately tx to [facility] due to need for HD. L sided weakness 3/18 and found to have R ACA stroke-showed acute infarct R ACA (3/19). Patient more lethargic 3/30 and CT abd=perf viscus 3/30. She was taken to the OR for ex lap for perforated pyloric ulcer with diffuse peritonitis, ischemic necrosis of mid ileum. Patient developed septic shock on 4-1, she was started on pressors and wean off pressors on 4-3. Subsequently, she was diagnosed with pelvic abscess by CT scan perform on 4-7. She underwent pelvic drain placement by IR on 4/8= culture neg 08/15/14 another drained placed in the pelvic cul-de-sac abscess with culture = pseudomonas. On 08/20/14, Gluteal drain was inadvertently pulled out. Repeat CT scan shows improvement in the fluid collection so decision was to leave it out and continue IV abx. Pt hospital stay also complicated by high output duodenal (enterocutaneous fistula) fistula. As a result, pt was made npo and started on TPN. Now stable, on TNA +high output from fistula/leak per [facility]. Had a G_J tube placed by Dr. [name] of IR and Gen surgery has continued to assist with management of her multiple surgical comorbidities [name]. diff diagnosed 4-12-initially unresponsive to flagyl, and she was started on PO vancomycin 4-16 which she finished 3 week.  5/27--TPN weaned off and increasing TF.  06/24/14 Develops run of SVT, atrial tachycardia. Cardiology was helping with management. Course complicated with high RV pressure and Cor Pulmonale; 4/27 CT angio negative for PE. On 5/2 she developed hypoxemia, hypotension and SVT during dialysis treatment. Dialysis was stopped. She received IV bolus. She was started on BIPAP.  She was transferred to Step down unit. PCCM and cardiology re consulted. Her Hr decreased, BP improved. She was taken off BIPAP. This recurred on 5/15 with episodic SVT. Since then she has had intermitten short episodes of SVT, but on 08/28/14 she has had more sustained SVT, therefore; cardiology was reconsulted on on 08/29/14.  Assessment/Plan: Status post exploratory laparotomy with closure of perforated pyloric ulcer/abdominal abscess / small bowel resection w/ postop ileus and now with enterocutaneous fistula. Pelvic abscess s/p percutaneous drain placement 4/8; had leakage from anastomosis site. Received Zosyn for 23 days. Chronic low grade leukocytosis range 13-15 -IV antibiotics were discontinued by Surgery on 4/22. Also received 18 days of Vancomycin and fluconazole.  -Due to Fistula, pt is back to NPO state, TPN was started -Fistula study done shows JP drain connection to the duodenal bulb-5-02. - 08/15/2014 repeat CT abdomen with 3.0 x 3.5 x 5.7 cm collection in the pelvic cul-de-sac and extraluminal air alongside JP drain in anterior abd wall -continues to have high output from Fistula/leak. Had G_J placed by IR 08/12/14 at request of gen surgery -CT repeat shows 5 cm post cul-de-sac abscess which was drained on 5/16-culture revealed pseudomonas aeruginosa -ID was consulted as patient had Pseudomonas growing from her culture wounds and they started back Flagyl as well as Elita Quick on 5/16  -Per ID recommendation--plan D#15/21 daysFortaz and D#15/ 28 days metronidazole -Patient inadvertently pulled out her gluteal/cul-de-sac drain May 19. Seen by interventional radiology and they recommended repeat CT scan, which was done. Shows that the fluid collection has decreased in size. They do  not plan to replace the drain at this time. Continue with antibiotics alone.  -08/21/2014 CT scan did suggest a transmural defect in the stomach wall near the patient's pyloric Cheree Ditto patch. We will let surgery  address this. -Patient accidentally pulled out her JP drain on 5/22. This has been replaced by surgery on 5/23. -Octreotide was discontinued 5/23--enterocutaneous fistula output appears to be decreasing gradually -finished TPN on 08/27/14 -Enteral Feeding was increased 5/27 to 55 mL per hour--tolerating without residuals -08/29/14--started clear liquids-->tolerating well -08/30/14--increased WBC-->check Cdiff--neg -08/31/14--pt inadvertently pulled out R-side JP drain again (3rd time) during therapy + GJ tube with bloody drainage--contacted general surgery 09/01/14--IR replaced RUQ drain (entercutaneous fistula)--reminded pt to wear abd binder at all times  PSVT --Atrial tachycardia -Felt to be secondary to severe anxiety with dialysis as well as COPD/Cor Pulmonale  -IV metoprolol use limited by pt's soft BP -appreciate Cardiology-->started IV amiodarone 5/28 -V-Q scan with intermediate probability for pulmonary embolism.  -CTA 4/27, negative for PE  -Chest pain on 5/22, likely related to the fact that JP drain was accidentally pulled out, which resulted in severe abdominal pain. Chest x-ray did not show any concerning findings. EKG was nonischemic. Troponins are unremarkable. Don't anticipate further workup unless she has recurrence of her symptoms. --HR improving on amiodarone drip -agree with switch to po amiodarone--she is able to take clear liquids -09/01/14--finish IV amio tonight, start PO amio 400mg  po bid and po metoprolol  ESRD new, now on HD this admit complicated by intermittent hypotension AKI on CKD 4, due to sepsis, shock Ongoing hemodialysis per Nephrology - perm-cath placed 3/26 d/t AVF problems Patient AV fistula reviewed and not mature yet per Dr. Paulene Floor need to revisit prior to d/[name] -TPN weaned off 08/27/14 -Continue hemodialysis Tuesday, Thursday, Saturday while the patient is in the hospital -Xanax with each HD due to significant anxiety with HD  [name]. difficile  colitis still on contact precautions -[name] diff positive on 4-12, Received 4 days of flagyl without improvement, then started on PO vanc -Completed 3 weeks of Oral Vanc-stopped 5/7, this was 1 week after IV Zosyn was stopped 5/5 -5/28--having loose stools again-->Cdiff PCR -keep on contact precautions  ACA CVA with Lt sided weakness -06/23/14--TEE ;normal LV function; no LAA thrombus -Started on ASA 81mg  per Neuro -Will need ongoing long term rehab - PT/OT to cont to follow while inpatient -Patient remains very weak and deconditioned. 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 Fluids/volume managed with hemodialysis.  Elevated PA pressures on 2-D echo; CTA negative for PE - 07/23/2014 echo-- EF 60-61%, grade 1 diastolic dysfunction, Severe RV dilatation, PAP 90  Small PFO Has been evaluated by Cardiology. No intervention planned currently.  Anemia of critical illness/chronic disease -and bleeding from leak/fistula which has resolved. -s/p 2U PRBC 4/13 and 5/10 and 5/26 Aranesp/iron per renal -transfuse for hemoglobin less than 7  Acute hypoxic Respiratory Failure:  -This was secondary to H1N1, hypervolemia, HCAP >> resolved.  -also has COPD and Cor Pulmonale - patient complained of some increased shortness of breath today-- wheezing on exam - start scheduled Xopenex and Atrovent - presently stable on 2 L-3L Thrombocytopenia; resolved.  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 On TPN now, CBGs stabilizedin 120-160s. Hba1c 6.5 -08/27/14--question accuracy of CBG of 457 on BMP this am--likely contaminated with TPN  Abnormal TSH -Free T4 WNL, T3 1.8 -most likely sick thyroid with ongoing infection and body stress; also due to amiodarone use. TSH in 7 range -continue low dose synthroid given PSVT, now on IV synthroid   L Renal mass  -noted on [place], Indeterminate 1.9 cm hypoechoic mass off the interpolar region of the left kidney -may potentially represent a cyst however solid mass is not excluded -needs outpatient FU for this  Obesity Inadequate oral intake related to altered GI function as evidenced by limited intake, ongoing.  Body mass index is 35.03 kg/(m^2).  Fall on May 18 Examination did not reveal any injuries.   DVT prophylaxis: SCDs Code Status: FULL Family Communication: Discussed with patient. No family at bedside. Disposition Plan: Have been unable to transfer patient out of stepdown unit due to dyspnea and tachycardia. She also has complex wound care needs. Dialysis is ongoing. Not ready for discharge.  Consultants: Cardiology Nephrology PCCM Gen Surgery  Palliative care    Procedures/Studies: Ct Abdomen Pelvis Wo Contrast  08/22/2014   CLINICAL DATA:  Abdominal abscess. No intravenous contrast due to renal failure.  EXAM: CT ABDOMEN AND PELVIS WITHOUT CONTRAST  TECHNIQUE: Multidetector CT imaging of the abdomen and pelvis was performed following the standard protocol without IV contrast.  COMPARISON:  08/15/2014  FINDINGS: BODY WALL: Open laparotomy without fluid collection.  LOWER CHEST: Chronic triangular opacity in the posterior costophrenic sulcus on the left, atelectasis versus scarring  ABDOMEN/PELVIS:  Liver: No focal abnormality.  Biliary: High-density material within the gallbladder showing  mild wall thickening which is chronic.  Pancreas: Unremarkable.  Spleen: Unremarkable.  Adrenals: Unremarkable.  Kidneys and ureters: No hydronephrosis or stone. Presumed 1 cm cyst from the interpolar left kidney  Bladder: Decompressed.  No pathologic findings.  Reproductive: Calcified and noncalcified uterine fibroids, better seen on previous enhanced imaging.  Bowel: Right upper quadrant drain related to perforated pyloric ulcer status post Cheree Ditto patch. Specially visible on coronal imaging, there is a gas channel through the pylorus measuring 6 to 9 mm in diameter. No bowel obstruction. Enteroenterostomy noted in the pelvis. Oral contrast again seen in the colon. No notable colonic wall thickening in this patient with history of [name] difficile. Negative appendix. Percutaneous gastrojejunostomy tube is in good position.  Retroperitoneum: No mass or adenopathy.  Peritoneum: Known thick walled collection in the rectovaginal recess smaller than 08/15/2014 at 40 x 16 mm as compared to 51 x 32 mm. No new collection is identified. No free pneumoperitoneum.  Vascular: 3 cm fusiform infrarenal aortic aneurysm.  OSSEOUS: No acute abnormalities.  IMPRESSION: 1. Near the patient's pyloric Cheree Ditto patch is a transmural defect in the stomach wall, contiguous with the surgical drain. If repeat surgery is considered, oral contrast could confirm an open ulcer. 2. Decreased rectovaginal recess fluid collection, now 4 x 1.5 cm (previously 5 x 3 cm).   Electronically Signed   By: Marnee Spring M.D.   On: 08/22/2014 01:49   Ct Abdomen Pelvis Wo Contrast  08/15/2014   CLINICAL DATA:  Intra abdominal abscess.  EXAM: CT ABDOMEN AND PELVIS WITHOUT CONTRAST  TECHNIQUE: Multidetector CT  imaging of the abdomen and pelvis was performed following the standard protocol without IV contrast.  COMPARISON:  CT scan of Aug 03, 2014.  FINDINGS: Severe degenerative disc disease is noted at L5-S1. Minimal subsegmental atelectasis is noted posteriorly  in the left lung base.  No gallstones are noted. No focal abnormality is noted in the liver, spleen or pancreas on these unenhanced images. Adrenal glands appear normal. Stable exophytic cyst is seen arising from midpole of left kidney. No hydronephrosis or renal obstruction is noted. No renal or ureteral calculi are noted. 3 cm infrarenal abdominal aortic aneurysm is noted. Gastrojejunostomy tube is seen entering stomach percutaneously and with distal tip in the jejunum. JP surgical drain is seen entering right upper quadrant of abdomen underneath the liver with distal tip in the left upper quadrant. No significant fluid collection is noted around the drain. Fluid collection is again identified and posterior cul-de-sac of pelvis measuring 5.1 x 3.2 cm. Calcified degenerating fibroid is noted in the uterus. Urinary bladder is unremarkable. There is no evidence of bowel obstruction. The appendix appears normal. No significant adenopathy is noted. Large midline surgical anterior abdominal wall incision is noted.  IMPRESSION: Stable 3 cm infrarenal abdominal aortic aneurysm.  No change in position of JP surgical drain with distal tip in the left upper quadrant of the abdomen.  Interval placement of percutaneous gastrojejunostomy tube with distal tip in the jejunum.  Grossly stable size and appearance of 5 cm fluid collection seen in posterior cul-de-sac of the pelvis.  Continued presence uterine fibroids.   Electronically Signed   By: Lupita Raider, M.D.   On: 08/15/2014 15:15   Dg Chest 1 View  08/13/2014   CLINICAL DATA:  Shortness of breath  EXAM: CHEST  1 VIEW  COMPARISON:  08/03/2014  FINDINGS: Moderately severe cardiac enlargement. Central line in unchanged position on the right and left side. Mild vascular congestion with no evidence of edema effusion or consolidation.  IMPRESSION: No acute findings   Electronically Signed   By: Esperanza Heir M.D.   On: 08/13/2014 11:04   Dg Chest 2 View  09/01/2014    CLINICAL DATA:  Followup respiratory failure  EXAM: CHEST  2 VIEW  COMPARISON:  08/23/2014  FINDINGS: Moderate cardiac enlargement. Left IJ catheter is identified with tip in the SVC. There is a right-sided dialysis catheter with tips in the SVC. No pleural effusion or edema. No airspace consolidation.  IMPRESSION: 1. Lungs remain clear. 2. Catheters positioned as above.   Electronically Signed   By: Signa Kell M.D.   On: 09/01/2014 09:04   Dg Abd 1 View  08/03/2014   CLINICAL DATA:  NG tube placement  EXAM: ABDOMEN - 1 VIEW  COMPARISON:  07/13/2014  FINDINGS: NG tube tip is in the proximal stomach. The side port is near the GE junction.  IMPRESSION: NG tube tip in the proximal stomach.   Electronically Signed   By: Charlett Nose M.D.   On: 08/03/2014 17:18   Ct Abdomen Pelvis W Contrast  08/04/2014   CLINICAL DATA:  New epigastric abdominal pain.  EXAM: CT ABDOMEN AND PELVIS WITH CONTRAST  TECHNIQUE: Multidetector CT imaging of the abdomen and pelvis was performed using the standard protocol following bolus administration of intravenous contrast.  CONTRAST:  OMNIPAQUE IOHEXOL 300 MG/ML  SOLN  COMPARISON:  07/16/2014  FINDINGS: There is a small volume extraluminal air alongside the JP drain in the anterior abdominal midline. Source of the air is not  clear. No other extraluminal air is evident in the abdomen or pelvis. No undrained fluid collection is evident alongside the JP drain. There is a 3.5 x 5.7 x 3.0 cm contained collection in the pelvic cul-de-sac which could represent a small abscess.  There is an open midline abdominal wound without associated drainable collection. There is herniation of abdominal fat through the abdominal wall musculature at the location of the wound.  There are normal appearances of the liver, spleen, pancreas, adrenals and kidneys with the exception of a 1.8 cm simple left renal cyst. There is an unchanged 3 cm infrarenal abdominal aortic aneurysm. The duodenum is draped  over the aneurysm but appears intact.  There are multiple uterine fibroids.  There is no significant abnormality in the lower chest.  IMPRESSION: *Extraluminal air alongside the JP drain in the anterior abdominal midline, etiology not apparent on this scan *3.0 x 3.5 x 5.7 cm collection in the pelvic cul-de-sac *Unchanged 3 cm infrarenal abdominal aortic aneurysm directly adjacent to the duodenum *Open wound in the abdominal midline. No fluid collection near the wound.   Electronically Signed   By: Ellery Plunk M.D.   On: 08/04/2014 02:24   Ir Gastrostomy Tube Mod Sed  08/12/2014   CLINICAL DATA:  Perforated duodenal ulcer, post g patch with persistent leak. Percutaneous gastrojejunostomy as requested for gastric decompression and small bowel enteral feeding support.  EXAM: PERC PLACEMENT GASTROSTOMY;  CONVERT G-TUBE TO G-JTUBE  FLUOROSCOPY TIME:  16 minutes 24 seconds, 1016.6 mGy  TECHNIQUE: The procedure, risks, benefits, and alternatives were explained to the patient. Questions regarding the procedure were encouraged and answered. The patient understands and consents to the procedure. As antibiotic prophylaxis, cefazolin 2 g was ordered pre-procedure and administered intravenously within one hour of incision. . A 5 French angiographic catheter was placed as orogastric tube. The upper abdomen was prepped with Betadine, draped in usual sterile fashion, and infiltrated locally with 1% lidocaine. 1 mg glucagon was administered intravenously to facilitate gastric distention and slow peristalsis. Stomach was insufflated using air through the orogastric tube. An 60 French sheath needle was advanced percutaneously into the gastric lumen under fluoroscopy. Gas could be aspirated and a small contrast injection confirmed intraluminal spread. The sheath was exchanged over a guidewire for a 9 [place] vascular sheath, through which the snare device was advanced and used to snare a guidewire passed through the orogastric  tube. This was withdrawn, and the snare attached to the 20 French pull-through gastrostomy tube, which was advanced antegrade, positioned with the internal bumper securing the anterior gastric wall to the anterior abdominal wall. Small contrast injection confirms appropriate positioning. The external bumper was applied and the catheter was flushed.  A 5 French angiographic catheter was placed coaxially through the gastrostomy tube and used to direct an angled stiff glidewire across the pylorus, beyond the proximal duodenal perforation, and into the proximal jejunum just beyond the ligament of Treitz. Over this, a coaxial jejunostomy feeding adapter was advanced. Contrast injection confirmed patency and appropriate position of the gastric and jejunal lumens. No extravasation. The lumens were flushed with saline and capped. The patient tolerated the procedure well.  COMPLICATIONS: COMPLICATIONS none  IMPRESSION: 1. Technically successful 20 French pull-through gastrostomy placement under fluoroscopy. 2. Technically successful coaxial jejunostomy feeding adapter placement to the ligament of Treitz.   Electronically Signed   By: Corlis Leak M.D.   On: 08/12/2014 16:12   Ir Catheter Tube Change  09/01/2014   CLINICAL DATA:  History  of a duodenal fistula. Surgical drain recently fell out and request for drain replacement.  EXAM: REPLACEMENT OF PERCUTANEOUS DRAIN WITH FLUOROSCOPY  Physician: Rachelle HoraAdam R. Henn, MD  FLUOROSCOPY TIME:  3 minutes, 246 mGy  MEDICATIONS AND MEDICAL HISTORY: None  ANESTHESIA/SEDATION: Moderate sedation time: None  CONTRAST:  15 mL Omnipaque-300  PROCEDURE: The procedure was explained to the patient. The risks and benefits of the procedure were discussed and the patient's questions were addressed. Informed consent was obtained from the patient. The right upper abdomen was prepped and draped in sterile fashion. Maximal barrier sterile technique was utilized including caps, mask, sterile gowns, sterile  gloves, sterile drape, hand hygiene and skin antiseptic. A 5 French catheter was advanced through the old drain site. Contrast was injected as the catheter was advanced into the tissue. Contrast injection demonstrated a connection to the duodenum bulb and stomach. Catheter and wire preferentially went into the stomach via the duodenal bulb. A series of wires were used to advance a 12 French [employer] catheter into the abdomen. The tip of the catheter was cut in order to advance the catheter over a wire. The tip was placed adjacent to the duodenal fistula but not within the bowel. Catheter was sutured to the skin. Catheter was attached to gravity bag.  FINDINGS: Focal fistula connection between the drain tract and the duodenal bulb. New 12 French catheter was placed within the fistula tract but outside of the bowel.  Estimated blood loss: Minimal  COMPLICATIONS: None  IMPRESSION: Successful replacement of the percutaneous drain. Demonstration of the duodenal bulb fistula. Drain was placed adjacent to the fistula opening but outside of the bowel.   Electronically Signed   By: Richarda OverlieAdam  Henn M.D.   On: 09/01/2014 13:39   Ir Adele SchilderGastr Tamsen Sniderube Convert Gastr-jej Per W/fl Mod Sed  08/12/2014   CLINICAL DATA:  Perforated duodenal ulcer, post g patch with persistent leak. Percutaneous gastrojejunostomy as requested for gastric decompression and small bowel enteral feeding support.  EXAM: PERC PLACEMENT GASTROSTOMY;  CONVERT G-TUBE TO G-JTUBE  FLUOROSCOPY TIME:  16 minutes 24 seconds, 1016.6 mGy  TECHNIQUE: The procedure, risks, benefits, and alternatives were explained to the patient. Questions regarding the procedure were encouraged and answered. The patient understands and consents to the procedure. As antibiotic prophylaxis, cefazolin 2 g was ordered pre-procedure and administered intravenously within one hour of incision. . A 5 French angiographic catheter was placed as orogastric tube. The upper abdomen was prepped  with Betadine, draped in usual sterile fashion, and infiltrated locally with 1% lidocaine. 1 mg glucagon was administered intravenously to facilitate gastric distention and slow peristalsis. Stomach was insufflated using air through the orogastric tube. An 6518 French sheath needle was advanced percutaneously into the gastric lumen under fluoroscopy. Gas could be aspirated and a small contrast injection confirmed intraluminal spread. The sheath was exchanged over a guidewire for a 9 [place] vascular sheath, through which the snare device was advanced and used to snare a guidewire passed through the orogastric tube. This was withdrawn, and the snare attached to the 20 French pull-through gastrostomy tube, which was advanced antegrade, positioned with the internal bumper securing the anterior gastric wall to the anterior abdominal wall. Small contrast injection confirms appropriate positioning. The external bumper was applied and the catheter was flushed.  A 5 French angiographic catheter was placed coaxially through the gastrostomy tube and used to direct an angled stiff glidewire across the pylorus, beyond the proximal duodenal perforation, and into the proximal jejunum just  beyond the ligament of Treitz. Over this, a coaxial jejunostomy feeding adapter was advanced. Contrast injection confirmed patency and appropriate position of the gastric and jejunal lumens. No extravasation. The lumens were flushed with saline and capped. The patient tolerated the procedure well.  COMPLICATIONS: COMPLICATIONS none  IMPRESSION: 1. Technically successful 20 French pull-through gastrostomy placement under fluoroscopy. 2. Technically successful coaxial jejunostomy feeding adapter placement to the ligament of Treitz.   Electronically Signed   By: Corlis Leak M.D.   On: 08/12/2014 16:12   Dg Chest Port 1 View  08/23/2014   CLINICAL DATA:  Left chest pain  EXAM: PORTABLE CHEST - 1 VIEW  COMPARISON:  08/18/2014  FINDINGS: Patient is  rotated.  Lungs are essentially clear. No focal consolidation. No pleural effusion or pneumothorax.  Cardiomegaly.  Left IJ venous catheter terminates at the cavoatrial junction. Right IJ dual lumen dialysis catheter terminates in the lower SVC.  IMPRESSION: No evidence of acute cardiopulmonary disease.   Electronically Signed   By: Charline Bills M.D.   On: 08/23/2014 19:17   Dg Chest Port 1 View  08/18/2014   CLINICAL DATA:  Shortness of breath at rest.  Having hemodialysis.  EXAM: PORTABLE CHEST - 1 VIEW  COMPARISON:  08/15/2014.  FINDINGS: The cardiac silhouette remains mildly enlarged. Stable right jugular double-lumen catheter. Clear lungs. The pulmonary vasculature remains prominent. No pleural fluid. Minimal right shoulder degenerative changes.  IMPRESSION: Stable cardiomegaly and pulmonary vascular congestion.   Electronically Signed   By: Beckie Salts M.D.   On: 08/18/2014 09:47   Dg Chest Port 1 View  08/15/2014   CLINICAL DATA:  Shortness of breath.  EXAM: PORTABLE CHEST - 1 VIEW  COMPARISON:  08/13/2014 and multiple prior chest radiographs  FINDINGS: Cardiomegaly and pulmonary vascular congestion again noted.  A right IJ central venous catheter is noted with tips overlying the upper and mid SVC.  A left central venous catheter is present with tip overlying the lower SVC.  There is no evidence of focal airspace disease, pulmonary edema, suspicious pulmonary nodule/mass, pleural effusion, or pneumothorax. No acute bony abnormalities are identified.  IMPRESSION: Cardiomegaly with pulmonary vascular congestion.   Electronically Signed   By: Harmon Pier M.D.   On: 08/15/2014 17:28   Dg Chest Port 1v Same Day  08/03/2014   CLINICAL DATA:  59 year old female dialysis patient with severe shortness of breath. Respiratory distress. Initial encounter.  EXAM: PORTABLE CHEST - 1 VIEW SAME DAY  COMPARISON:  Chest CTA 07/29/2014 and earlier.  FINDINGS: Portable AP semi upright view at 1400 hrs. Bilateral IJ  approach chest catheters appears stable. Stable lung volumes. Stable cardiomegaly and mediastinal contours. No pneumothorax, pulmonary edema, pleural effusion or consolidation. No acute pulmonary opacity identified.  IMPRESSION: No acute cardiopulmonary abnormality.   Electronically Signed   By: Odessa Fleming M.D.   On: 08/03/2014 14:18   Dg Abd Portable 1v  08/23/2014   CLINICAL DATA:  JP drain, broken, initial encounter  EXAM: PORTABLE ABDOMEN - 1 VIEW  COMPARISON:  CT abdomen pelvis dated 08/21/2014  FINDINGS: A portion of the right flank is excluded from this single image.  The patient's right abdominal surgical drain is not visualized.  Gastrojejunostomy in satisfactory position.  IMPRESSION: The patient's right abdominal surgical drain is not visualized.  Gastrojejunostomy in satisfactory position.   Electronically Signed   By: Charline Bills M.D.   On: 08/23/2014 19:11   Ct Image Guided Fluid Drain By Catheter  08/17/2014  CLINICAL DATA:  Postoperative intra-abdominal abscess, subsequent encounter. Small pelvic fluid collection.  EXAM: CT-GUIDED DRAIN PLACEMENT IN PELVIC FLUID COLLECTION  Physician: Rachelle Hora. Lowella Dandy, MD  FLUOROSCOPY TIME:  None  MEDICATIONS: 50 mcg fentanyl  ANESTHESIA/SEDATION: Patient was monitored by a radiology nurse during the procedure.  PROCEDURE: Informed consent was obtained for drain placement. Patient was placed on her left side. Images through the pelvis were obtained. The right buttock was prepped and draped in sterile fashion. 1% lidocaine was used for a local anesthetic. An 18 gauge needle was directed into the pelvic fluid collection with CT guidance from a transgluteal approach. Thick yellow purulent fluid was aspirated. A stiff Amplatz wire was placed. The tract was dilated to accommodate a 10.2 [place] multipurpose drain. It was technically difficult to advance the drain into the collection due to the small size of the collection and the patient's body habitus. Eventually,  the drain was successfully placed within the collection. 5 mL of yellow purulent fluid was obtained. Catheter was sutured to the skin and attached to a suction bulb. Fluid was sent for culture.  FINDINGS: Small fluid collection just posterior to the uterus. A total of 5 mL of yellow purulent fluid was removed.  Estimated blood loss: Minimal  COMPLICATIONS: None  IMPRESSION: CT-guided placement of a drainage catheter in the small pelvic fluid collection.   Electronically Signed   By: Richarda Overlie M.D.   On: 08/17/2014 17:21         Subjective: Patient states that abdominal pain is about the same as usual. Denies any chest pain, shortness breath, nausea, vomiting, diarrhea. She had one bowel movement. Denies any headache, fevers, chills, hematochezia, melena.  Objective: Filed Vitals:   09/01/14 0751 09/01/14 0815 09/01/14 1037 09/01/14 1411  BP:  99/64    Pulse:  102 112   Temp:  98.3 F (36.8 [name])    TempSrc:  Axillary    Resp:  23    Height:      Weight:      SpO2: 99% 100% 100% 100%    Intake/Output Summary (Last 24 hours) at 09/01/14 1506 Last data filed at 09/01/14 1400  Gross per 24 hour  Intake   1769 ml  Output   1100 ml  Net    669 ml   Weight change:  Exam:   General:  Pt is alert, follows commands appropriately, not in acute distress  HEENT: No icterus, No thrush, no meningismus [place]/AT  Cardiovascular: RRR, S1/S2, no rubs, no gallops  Respiratory: Bibasilar expiratory wheeze. Good air movement.  Abdomen: Soft/+BS, non tender, non distended, no guarding  Extremities: trace LE edema, No lymphangitis, No petechiae, No rashes, no synovitis  Data Reviewed: Basic Metabolic Panel:  Recent Labs Lab 08/27/14 0539 08/29/14 0520  NA 132* 140  K 3.5 3.5  CL 96* 102  CO2 27 28  GLUCOSE 457* 104*  BUN 34* 32*  CREATININE 4.56* 4.48*  CALCIUM 7.8* 8.3*  MG 1.7  --   PHOS 4.4 4.4   Liver Function Tests:  Recent Labs Lab 08/27/14 0539 08/29/14 0520  AST 17   --   ALT 8*  --   ALKPHOS 108  --   BILITOT 1.0  --   PROT 6.6  --   ALBUMIN 1.9* 1.9*   No results for input(s): LIPASE, AMYLASE in the last 168 hours. No results for input(s): AMMONIA in the last 168 hours. CBC:  Recent Labs Lab 08/27/14 0539 08/28/14 2025 08/29/14 0520  08/29/14 2008 08/31/14 0600  WBC 13.9* 15.7* 14.3* 20.5* 11.3*  HGB 6.8* 9.7* 9.1* 9.9* 9.1*  HCT 24.7* 31.0* 29.3* 31.0* 29.7*  MCV 103.8* 94.5 94.8 93.9 95.8  PLT 323 336 303 229 232   Cardiac Enzymes: No results for input(s): CKTOTAL, CKMB, CKMBINDEX, TROPONINI in the last 168 hours. BNP: Invalid input(s): POCBNP CBG:  Recent Labs Lab 08/25/14 1741 08/27/14 1218 08/27/14 1817  GLUCAP 124* 112* 157*    Recent Results (from the past 240 hour(s))  Clostridium Difficile by PCR     Status: None   Collection Time: 08/31/14  2:35 PM  Result Value Ref Range Status   [name] difficile by pcr NEGATIVE NEGATIVE Final     Scheduled Meds: . sodium chloride   Intravenous Once  . ALPRAZolam  0.5 mg Oral Q T,Th,Sa-HD  . antiseptic oral rinse  7 mL Mouth Rinse q12n4p  . budesonide (PULMICORT) nebulizer solution  0.25 mg Nebulization BID  . cefTAZidime (FORTAZ)  IV  2 g Intravenous Q T,Th,Sa-HD  . chlorhexidine  15 mL Mouth Rinse BID  . darbepoetin (ARANESP) injection - DIALYSIS  200 mcg Intravenous Q Thu-HD  . feeding supplement (VITAL AF 1.2 CAL)  1,000 mL Per Tube Q24H  . ipratropium  0.5 mg Nebulization TID  . levalbuterol  1.25 mg Nebulization TID  . levothyroxine  12.5 mcg Intravenous Daily  . lidocaine      . metoprolol  2.5 mg Intravenous 4 times per day  . metronidazole  500 mg Intravenous Q8H  . sodium chloride  10-40 mL Intracatheter Q12H  . vitamin [name]  1,000 mg Per Tube Daily   Continuous Infusions: . sodium chloride 10 mL/hr at 08/19/14 0950  . amiodarone 30 mg/hr (09/01/14 1019)     Coltyn Hanning, DO  [facility] Pager [phone]  If 7PM-7AM, please contact  night-coverage [email or web address] Password [email or web address] 09/01/2014, 3:06 PM   LOS: 76 days

## 2014-09-01 NOTE — Progress Notes (Signed)
TELEMETRY: Reviewed telemetry pt in NSR, sinus tachycardia: Filed Vitals:   08/31/14 2027 08/31/14 2101 09/01/14 0029 09/01/14 0525  BP: 105/66  95/62 99/61  Pulse: 111   110  Temp: 98 F (36.7 C)  98.4 F (36.9 C) 98 F (36.7 C)  TempSrc: Oral  Oral Oral  Resp: 27   21  Height:      Weight:      SpO2: 92% 95%      Intake/Output Summary (Last 24 hours) at 09/01/14 0725 Last data filed at 09/01/14 0600  Gross per 24 hour  Intake 1885.8 ml  Output   1025 ml  Net  860.8 ml   Filed Weights   08/29/14 0353 08/30/14 0450 08/31/14 0500  Weight: 95.7 kg (210 lb 15.7 oz) 95.8 kg (211 lb 3.2 oz) 97.6 kg (215 lb 2.7 oz)    Subjective Swallowing some clear liquids. No chest pain or palpitations. Complains of SOB when she goes to dialysis.  Marland Kitchen sodium chloride   Intravenous Once  . ALPRAZolam  0.5 mg Oral Q T,Th,Sa-HD  . antiseptic oral rinse  7 mL Mouth Rinse q12n4p  . budesonide (PULMICORT) nebulizer solution  0.25 mg Nebulization BID  . cefTAZidime (FORTAZ)  IV  2 g Intravenous Q T,Th,Sa-HD  . chlorhexidine  15 mL Mouth Rinse BID  . darbepoetin (ARANESP) injection - DIALYSIS  200 mcg Intravenous Q Thu-HD  . feeding supplement (VITAL AF 1.2 CAL)  1,000 mL Per Tube Q24H  . ipratropium  0.5 mg Nebulization TID  . levalbuterol  1.25 mg Nebulization TID  . levothyroxine  12.5 mcg Intravenous Daily  . metoprolol  2.5 mg Intravenous 4 times per day  . metronidazole  500 mg Intravenous Q8H  . sodium chloride  10-40 mL Intracatheter Q12H  . vitamin C  1,000 mg Per Tube Daily   . sodium chloride 10 mL/hr at 08/19/14 0950  . amiodarone 30 mg/hr (08/31/14 2201)    LABS: Basic Metabolic Panel: No results for input(s): NA, K, CL, CO2, GLUCOSE, BUN, CREATININE, CALCIUM, MG, PHOS in the last 72 hours. Liver Function Tests: No results for input(s): AST, ALT, ALKPHOS, BILITOT, PROT, ALBUMIN in the last 72 hours. No results for input(s): LIPASE, AMYLASE in the last 72  hours. CBC:  Recent Labs  08/29/14 2008 08/31/14 0600  WBC 20.5* 11.3*  HGB 9.9* 9.1*  HCT 31.0* 29.7*  MCV 93.9 95.8  PLT 229 232   Cardiac Enzymes: No results for input(s): CKTOTAL, CKMB, CKMBINDEX, TROPONINI in the last 72 hours. BNP: No results for input(s): PROBNP in the last 72 hours. D-Dimer: No results for input(s): DDIMER in the last 72 hours. Hemoglobin A1C: No results for input(s): HGBA1C in the last 72 hours. Fasting Lipid Panel: No results for input(s): CHOL, HDL, LDLCALC, TRIG, CHOLHDL, LDLDIRECT in the last 72 hours. Thyroid Function Tests: No results for input(s): TSH, T4TOTAL, T3FREE, THYROIDAB in the last 72 hours.  Invalid input(s): FREET3   Radiology/Studies:  No results found.  Echo: 07/23/14: Study Conclusions  - Left ventricle: The cavity size was normal. There was moderate concentric hypertrophy. Systolic function was normal. The estimated ejection fraction was in the range of 60% to 65%. Wall motion was normal; there were no regional wall motion abnormalities. Doppler parameters are consistent with abnormal left ventricular relaxation (grade 1 diastolic dysfunction). There was no evidence of elevated ventricular filling pressure by Doppler parameters. - Ventricular septum: The contour showed diastolic flattening and systolic flattening consistent with right  ventricular volume and pressure overload . - Aortic valve: Trileaflet; mildly thickened, mildly calcified leaflets. Transvalvular velocity was within the normal range. There was no stenosis. There was no regurgitation. - Mitral valve: Mildly thickened leaflets . There was no regurgitation. - Right ventricle: The cavity size was severely dilated. Wall thickness was normal. Systolic function was mildly to moderately reduced. - Right atrium: The atrium was severely dilated. - Tricuspid valve: There was moderate-severe regurgitation. - Pulmonic valve: There was  no regurgitation. - Pulmonary arteries: The main pulmonary artery was normal-sized. Systolic pressure was severely increased. PA peak pressure: 90 mm Hg (S). - Inferior vena cava: The vessel was normal in size. - Pericardium, extracardiac: There was no pericardial effusion.  Impressions:  - Normal LV systolic function, impaired relaxation with normal filling pressures. Severely dilated right sided chamber and severe pulmonary hypertension consistent with cor pulmonale.   PHYSICAL EXAM General: Well developed, chronically ill, in no acute distress. Head: Normocephalic, atraumatic, sclera non-icteric, oropharynx is clear Neck: Normal Lungs: Bilateral coarse rhonchi and wheezing. Breathing is unlabored. Heart: RRR S1 S2 without murmurs, rubs, or gallops.  Abdomen: Soft, non-tender, non-distended with normoactive bowel sounds.  Extremities: No clubbing, cyanosis or edema.  Distal pedal pulses are 2+ and equal bilaterally. Neuro: Alert and oriented X 3. Moves all extremities spontaneously. Psych:  Responds to questions appropriately with a normal affect.  ASSESSMENT AND PLAN:   The patient has a long and complex history. She has been hospitalized since March, 2016. She has had supraventricular tachycardia. 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 Beta blockade helps some. Currently her blood pressure is on the low side and there is hesitation to give her beta blocker. The patient had a CVA during this admission. 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 There has been no documented atrial fibrillation. It has been felt that amiodarone is the only drug that will definitely help with her rhythm. She was on amiodarone for a period of time. This included oral amiodarone. She could no longer take oral meds after while in the amiodarone was stopped. She has been seen by the cardiology team intermittently since March, 2016.   1. 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 Maintaining NSR on IV metoprolol and amiodarone. Now on clear liquids. Can transition metoprolol and amiodarone to po when OK with primary team. (metoprolol 12.5 mg bid and amiodarone 400 mg bid.)  2. ESRD on dialysis  3. History of CVA. No documented Afib.  4. Copd with cor pulmonale. Lungs sound bad today. Will update CXR.   5. S/p exploratory lap with closure of perforated pyloric ulcer/drainage of abdominal abscess. Post op ileus and enterocutaneous fistula. Pelvic abscess.  Present on Admission:  . Acute respiratory failure with hypoxia . Cerebral thrombosis with cerebral infarction . Hypertension . Obesity (BMI 30-39.9) . Depression . Left renal mass . Chronic diastolic heart failure . H1N1 influenza . Tobacco use disorder . Renal failure (ARF), acute on chronic . Hyperlipidemia . SOB (shortness of breath) . Perforated gastric ulcer . ESRD needing dialysis  Signed, Aminta Sakurai [place], MDFACC 09/01/2014 7:25 AM

## 2014-09-01 NOTE — Procedures (Signed)
Surgical drain in RUQ recently fell out.  A catheter was advanced through the old tract and the catheter preferentially went into stomach via the duodenal bulb region.  A 12 [employer] catheter was advanced over a wire and the tip was placed adjacent to the fistula opening but left outside of the bowel.  No immediate complication.

## 2014-09-01 NOTE — Procedures (Signed)
I was present at this dialysis session, have reviewed the session itself and made  appropriate changes  Patient for dialysis today.  Weight' s stable, drain fell out and was replaced by IR today.  No complaints.    Vinson Moselleob Imre Vecchione MD (pgr) [phone]    ([phone]) [phone] 09/01/2014, 5:05 PM

## 2014-09-01 NOTE — Progress Notes (Signed)
CSW spoke with pt on 5/30 to discuss SNF- pt is agreeable but would like to return home if at all possible.  CSW updated Fl2.  No bed offers at this time: per [facility] dialysis pt is unable to sit up to receive dialysis and, therefore, can not be CLIPed at an outpatient dialysis center.  CSW will continue to follow.  Merlyn LotJenna Holoman, LCSWA Clinical Social Worker [phone]

## 2014-09-01 NOTE — Progress Notes (Signed)
Physical Therapy Treatment Patient Details Name: Lorah C Mura MRN: [id number] DOB: 1955/05/24 Today's Date: 09/01/2014    History of Present Illness [name] is a 59 y.o. female with a history of CKD and hypertension came to the [facility] ED on 3/16 complaining of shortness of breath and generalized weakness x 2 weeks. Admitted with acute hypoxemic respiratory failure. During hospital stay noted to have weakness of her left side. 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  She developed septic shock 4/1 with pressors weaned off 4/3.  Began CRRT and pressors restarted 4/4; Developed pelvic abcess 4/7 with pelvic drain placed 4/8.  weaned off pressors 4/9.  Runs of SVT 4/5 and 4/11. Prolonged hospital course with hypoxemia and hypotension. Right abcominal drain accidentally pulled 5/30    PT Comments    Pt just returned from procedure needing to void and asking for bedpan. Encouragement needed and reassurance for use of BSC which pt is capable of using with nursing staff made aware to progress mobility and not limit pt to use of bedpan. Pt educated for transfers, mobility and gait with decreased distance in each trial due to abdominal pain. Will continue to follow.   Follow Up Recommendations  CIR;Supervision/Assistance - 24 hour     Equipment Recommendations       Recommendations for Other Services       Precautions / Restrictions Precautions Precautions: Fall Precaution Comments: abdominal drains to foley bag, G tube    Mobility  Bed Mobility Overal bed mobility: Needs Assistance       Supine to sit: Min assist;HOB elevated     General bed mobility comments: cues for sequence with assist to scoot hips to EOB  Transfers Overall transfer level: Needs assistance Equipment used: Rolling walker (2 wheeled) Transfers: Sit to/from [employer] Sit to Stand: Min assist Stand pivot transfers: Min assist       General transfer comment: cues for sequence and hand placement with stand pivot bed to [facility] and back. pt also stood from recliner x 2 during gait. Mod assist x 1 to stand from recliner but other 3 trials min assist  Ambulation/Gait Ambulation/Gait assistance: Min assist;+2 safety/equipment Ambulation Distance (Feet): 30 Feet Assistive device: Rolling walker (2 wheeled) Gait Pattern/deviations: Step-through pattern;Decreased stride length;Trunk flexed   Gait velocity interpretation: Below normal speed for age/gender General Gait Details: Pt willing to work and walk. Ambulated 20', 30', and 30' with seated rest grossly 2 min between each bout. Cues for posture, looking up and position in RW. Increased BOS today   Stairs            Wheelchair Mobility    Modified Rankin (Stroke Patients Only)       Balance                                    Cognition Arousal/Alertness: Awake/alert Behavior During Therapy: WFL for tasks assessed/performed Overall Cognitive Status: Within Functional Limits for tasks assessed                      Exercises      General Comments        Pertinent Vitals/Pain Pain Assessment: 0-10 Pain Score: 6  Pain Location: abdomen Pain Descriptors / Indicators: Sore Pain Intervention(s):  Limited activity within patient's tolerance;Repositioned  HR 114-153 with limited bouts of tachycardia, non-sustained sats 100% on 2L    Home Living                      Prior Function            PT Goals (current goals can now be found in the care plan section) Progress towards PT goals: Progressing toward goals    Frequency       PT Plan Current plan remains appropriate    Co-evaluation             End of Session Equipment Utilized During Treatment: Gait belt;Oxygen Activity Tolerance: Patient tolerated treatment well Patient left: in  chair;with call bell/phone within reach;with nursing/sitter in room     Time: 0911-0947 PT Time Calculation (min) (ACUTE ONLY): 36 min  Charges:  $Gait Training: 8-22 mins $Therapeutic Activity: 8-22 mins                    G Codes:      Delorse Lekabor, Ainslee Sou Beth 09/01/2014, 10:41 AM Delaney MeigsMaija Tabor Coy Vandoren, PT [phone]

## 2014-09-01 NOTE — Progress Notes (Signed)
62 Days Post-Op  Subjective: Pt lost her drain fistula drain yesterday.  Drain placed on 08/17/14 by IR, last CT on 08/21/14.  She had some blood in the gastrostomy tube drain yesterday also but is is clear now.  Complains binder is to tight and tape hurts to come off. Abdomen is very sore.  Objective: Vital signs in last 24 hours: Temp:  [97.1 F (36.2 C)-98.4 F (36.9 C)] 98 F (36.7 C) (05/31 0525) Pulse Rate:  [95-111] 110 (05/31 0525) Resp:  [20-27] 21 (05/31 0525) BP: (95-107)/(60-81) 99/61 mmHg (05/31 0525) SpO2:  [92 %-100 %] 99 % (05/31 0751) Last BM Date: 08/31/14 1 BM recorded Afebrile, VSS WBC is better yesterday no labs this AM Intake/Output from previous day: 05/30 0701 - 05/31 0700 In: 1885.8 [I.V.:420.8; NG/GT:1265; IV Piggyback:200] Out: 1025 [Urine:125; Drains:900] Intake/Output this shift:    General appearance: alert, cooperative and no distress GI: soft sore, open wound looks good, + BS.  drain site dry, gastrostomy tube site is OK Left knee is swollen this AM and she says this is new. Lab Results:   Recent Labs  08/29/14 2008 08/31/14 0600  WBC 20.5* 11.3*  HGB 9.9* 9.1*  HCT 31.0* 29.7*  PLT 229 232    BMET No results for input(s): NA, K, CL, CO2, GLUCOSE, BUN, CREATININE, CALCIUM in the last 72 hours. PT/INR No results for input(s): LABPROT, INR in the last 72 hours.   Recent Labs Lab 08/27/14 0539 08/29/14 0520  AST 17  --   ALT 8*  --   ALKPHOS 108  --   BILITOT 1.0  --   PROT 6.6  --   ALBUMIN 1.9* 1.9*     Lipase  No results found for: LIPASE   Studies/Results: No results found.  Medications: . sodium chloride   Intravenous Once  . ALPRAZolam  0.5 mg Oral Q T,Th,Sa-HD  . antiseptic oral rinse  7 mL Mouth Rinse q12n4p  . budesonide (PULMICORT) nebulizer solution  0.25 mg Nebulization BID  . cefTAZidime (FORTAZ)  IV  2 g Intravenous Q T,Th,Sa-HD  . chlorhexidine  15 mL Mouth Rinse BID  . darbepoetin (ARANESP) injection -  DIALYSIS  200 mcg Intravenous Q Thu-HD  . feeding supplement (VITAL AF 1.2 CAL)  1,000 mL Per Tube Q24H  . ipratropium  0.5 mg Nebulization TID  . levalbuterol  1.25 mg Nebulization TID  . levothyroxine  12.5 mcg Intravenous Daily  . metoprolol  2.5 mg Intravenous 4 times per day  . metronidazole  500 mg Intravenous Q8H  . sodium chloride  10-40 mL Intracatheter Q12H  . vitamin C  1,000 mg Per Tube Daily   . sodium chloride 10 mL/hr at 08/19/14 0950  . amiodarone 30 mg/hr (08/31/14 2201)    Assessment/Plan SOB/hypoxia/volume over load 06/17/14 Pneumoperitoneum 07/01/14 Perforated pyloric ulcer with diffuse peritonitis, ischemic necrosis of mid ileum, possibly embolic Exploratory laparotomy, evacuation of ascites, closure of perforated pyloric ulcer with omental patch, segmental resection of 14 inches of ileum with primary anastomosis Doppler evaluation of small bowel mesenteric arterial vessels.07/01/14, Dr. 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 Arbutus Leas and Dr. Luisa Hart.  Will ask IR to replace drain.  Recheck labs in Am, already ordered.    LOS: 76 days    [name] 09/01/2014

## 2014-09-02 DIAGNOSIS — R1013 Epigastric pain: Secondary | ICD-10-CM

## 2014-09-02 LAB — GLUCOSE, CAPILLARY: Glucose-Capillary: 97 mg/dL (ref 65–99)

## 2014-09-02 MED ORDER — ALTEPLASE 2 MG IJ SOLR
2.0000 mg | Freq: Once | INTRAMUSCULAR | Status: AC
  Administered 2014-09-02: 2 mg
  Filled 2014-09-02: qty 2

## 2014-09-02 MED ORDER — ASPIRIN 81 MG PO CHEW
81.0000 mg | CHEWABLE_TABLET | Freq: Every day | ORAL | Status: DC
  Administered 2014-09-03 – 2014-09-11 (×9): 81 mg via ORAL
  Filled 2014-09-02 (×9): qty 1

## 2014-09-02 MED ORDER — LORAZEPAM 2 MG/ML IJ SOLN: 1.0000 mg | INTRAMUSCULAR | Status: DC

## 2014-09-02 NOTE — Progress Notes (Signed)
PROGRESS NOTE  [name] ZOX:[id number] DOB: Aug 06, 1955 DOA: 06/17/2014 PCP: [name], MD  Brief Narrative: 59 yo ? smoker with hx HTN, COPD, CKD IV followed @ [facility]-[name] Page, failed L AV fistula, initially admitted 3/16 with flu and acute hypoxic resp failure. Ultimately tx to [facility] due to need for HD. L sided weakness 3/18 and found to have R ACA stroke-showed acute infarct R ACA (3/19). Patient more lethargic 3/30 and CT abd=perf viscus 3/30. She was taken to the OR for ex lap for perforated pyloric ulcer with diffuse peritonitis, ischemic necrosis of mid ileum. Patient developed septic shock on 4-1, she was started on pressors and wean off pressors on 4-3. Subsequently, she was diagnosed with pelvic abscess by CT scan perform on 4-7. She underwent pelvic drain placement by IR on 4/8= culture neg 08/15/14 another drained placed in the pelvic cul-de-sac abscess with culture = pseudomonas. On 08/20/14, Gluteal drain was inadvertently pulled out. Repeat CT scan shows improvement in the fluid collection so decision was to leave it out and continue IV abx. Pt hospital stay also complicated by high output duodenal (enterocutaneous fistula) fistula. As a result, pt was made npo and started on TPN. Now stable, on TNA +high output from fistula/leak per [facility]. Had a G_J tube placed by Dr. [name] of IR and Gen surgery has continued to assist with management of her multiple surgical comorbidities C. diff diagnosed 4-12-initially unresponsive to flagyl, and she was started on PO vancomycin 4-16 which she finished 3 week.  5/27--TPN weaned off and increasing TF.  06/24/14 Develops run of SVT, atrial tachycardia. Cardiology was helping with management. Course complicated with high RV pressure and Cor Pulmonale; 4/27 CT angio negative for PE. On 5/2 she developed hypoxemia, hypotension and SVT during dialysis treatment. Dialysis was stopped. She received IV bolus. She was started on BIPAP.  She was transferred to Step down unit. PCCM and cardiology re consulted. Her Hr decreased, BP improved. She was taken off BIPAP. This recurred on 5/15 with episodic SVT. Since then she has had intermitten short episodes of SVT, but on 08/28/14 she has had more sustained SVT, therefore; cardiology was reconsulted on on 08/29/14.  Assessment/Plan: Status post exploratory laparotomy with closure of perforated pyloric ulcer/abdominal abscess / small bowel resection w/ postop ileus and now with enterocutaneous fistula. Pelvic abscess s/p percutaneous drain placement 4/8; had leakage from anastomosis site. Received Zosyn for 23 days. Chronic low grade leukocytosis range 13-15 -IV antibiotics were discontinued by Surgery on 4/22. Also received 18 days of Vancomycin and fluconazole.  -Due to Fistula, she was made NPO, TPN was started -Fistula study done shows JP drain connection to the duodenal bulb-5-02. - 08/15/2014 repeat CT abdomen with 3.0 x 3.5 x 5.7 cm collection in the pelvic cul-de-sac and extraluminal air alongside JP drain in anterior abd wall -had high output from Fistula/leak. Had G_J placed by IR 08/12/14 at request of gen surgery -CT repeat shows 5 cm post cul-de-sac abscess which was drained on 5/16-culture revealed pseudomonas aeruginosa -ID was consulted as patient had Pseudomonas growing from her culture wounds and they started back Flagyl as well as Elita Quick on 5/16  -Per ID recommendation--plan D#16/21 daysFortaz and D#16/ 28 days metronidazole -Patient inadvertently pulled out her gluteal/cul-de-sac drain May 19. Seen by interventional radiology and they recommended repeat CT scan, which was done. Shows that the fluid collection has decreased in size. They do not plan to replace  the drain at this time. Continue with antibiotics alone.  -08/21/2014 CT scan did suggest a transmural defect in the stomach wall near the patient's pyloric Cheree DittoGraham patch. -Patient accidentally pulled out her JP  drain on 5/22. This has been replaced by surgery on 5/23. -Octreotide was discontinued 5/23--enterocutaneous fistula output appears to be decreasing gradually -finished TPN on 08/27/14 -Enteral Feeding was increased 5/27 to 55 mL per hour--tolerating without residuals -08/29/14--started clear liquids-->tolerating well -08/30/14--increased WBC-->check Cdiff--neg -08/31/14--pt inadvertently pulled out R-side JP drain again (3rd time) during therapy + GJ tube with bloody drainage--contacted general surgery 09/01/14--IR replaced RUQ drain (entercutaneous fistula)--reminded pt to wear abd binder at all times; no output from this drain 6/1: started full liquids   PSVT --Atrial tachycardia -Felt to be secondary to fluid metabolic shifts from ESRD and ongoing abd issues; alos has severe anxiety with dialysis as well as COPD/Cor Pulmonale  -was on IV metoprolol and amiodarone, now 5/31 switched to PO -V-Q scan with intermediate probability for pulmonary embolism.  -CTA 4/27, negative for PE  -Cards following  ESRD new, now on HD this admit complicated by intermittent hypotension AKI on CKD 4, due to sepsis, shock Ongoing hemodialysis per Nephrology - perm-cath placed 3/26 d/t AVF problems Patient AV fistula reviewed and not mature yet per Dr. Paulene FloorFields-will need to revisit prior to d/c -TPN weaned off 08/27/14 -Continue hemodialysis Tuesday, Thursday, Saturday while the patient is in the hospital -Xanax with each HD due to significant anxiety with HD  C. difficile colitis still on contact precautions -C diff positive on 4-12, Received 4 days of flagyl without improvement, then started on PO vanc -Completed 3 weeks of Oral Vanc-stopped 5/7, this was 1 week after IV Zosyn was stopped 5/5 -5/28--having loose stools again-->Cdiff PCR -keep on contact precautions  ACA CVA with Lt sided weakness -06/23/14--TEE ;normal LV function; no LAA thrombus -Started on ASA 81mg  per Neuro; resume this -Will need  ongoing long term rehab - PT/OT to cont to follow while inpatient -Patient remains very weak and deconditioned. 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 Fluids/volume managed with hemodialysis.  Elevated PA pressures on 2-D echo; CTA negative for PE - 07/23/2014 echo-- EF 60-61%, grade 1 diastolic dysfunction, Severe RV dilatation, PAP 90  Small PFO Has been evaluated by Cardiology. No intervention planned currently.  Anemia of critical illness/chronic disease -and bleeding from leak/fistula which has resolved. -s/p 2U PRBC 4/13 and 5/10 and 5/26 -Aranesp/iron per renal -transfuse for hemoglobin less than 7  Acute hypoxic Respiratory Failure:  -This was secondary to H1N1, hypervolemia, HCAP >> resolved.  -also has COPD and Cor Pulmonale - now on scheduled Xopenex and Atrovent, change to PRN tomorrow - presently stable on 2 L-3L  Thrombocytopenia; resolved.  Appears to be associated with sepsis Has had mild positive HIT test during this admission   Septic shock  -due to perforated prepyloric ulcer, required pressors -resolved currently  Diabetes mellitus 2 Did have episodes of Hypoglycemia in setting of NPO status. On TPN now, CBGs stabilizedin 120-160s. Hba1c 6.5 -08/27/14--question accuracy of CBG of 457 on BMP this am--likely contaminated with TPN  Abnormal TSH -Free T4 WNL, T3 1.8 -most likely sick thyroid with ongoing infection and body stress; also due to amiodarone use. TSH in 7 range -continue low dose synthroid given PSVT, now on IV synthroid   L Renal mass  -noted on US, Indeterminate 1.9  cm hypoechoic mass off the interpolar region of the left kidney -may potentially represent a cyst however solid mass is not excluded -needs outpatient FU for this  Fall on May 18  DVT prophylaxis: SCDs Code Status: FULL Family Communication:  Discussed with patient. No family at bedside. Disposition Plan: Have been unable to transfer patient out of stepdown unit due to dyspnea and tachycardia. She also has complex wound care needs. Dialysis is ongoing. Not ready for discharge. Not accepted to [facility]  Consultants: Cardiology Nephrology PCCM Gen Surgery  Palliative care    Procedures/Studies: Ct Abdomen Pelvis Wo Contrast  08/22/2014   CLINICAL DATA:  Abdominal abscess. No intravenous contrast due to renal failure.  EXAM: CT ABDOMEN AND PELVIS WITHOUT CONTRAST  TECHNIQUE: Multidetector CT imaging of the abdomen and pelvis was performed following the standard protocol without IV contrast.  COMPARISON:  08/15/2014  FINDINGS: BODY WALL: Open laparotomy without fluid collection.  LOWER CHEST: Chronic triangular opacity in the posterior costophrenic sulcus on the left, atelectasis versus scarring  ABDOMEN/PELVIS:  Liver: No focal abnormality.  Biliary: High-density material within the gallbladder showing mild wall thickening which is chronic.  Pancreas: Unremarkable.  Spleen: Unremarkable.  Adrenals: Unremarkable.  Kidneys and ureters: No hydronephrosis or stone. Presumed 1 cm cyst from the interpolar left kidney  Bladder: Decompressed.  No pathologic findings.  Reproductive: Calcified and noncalcified uterine fibroids, better seen on previous enhanced imaging.  Bowel: Right upper quadrant drain related to perforated pyloric ulcer status post Cheree Ditto patch. Specially visible on coronal imaging, there is a gas channel through the pylorus measuring 6 to 9 mm in diameter. No bowel obstruction. Enteroenterostomy noted in the pelvis. Oral contrast again seen in the colon. No notable colonic wall thickening in this patient with history of C difficile. Negative appendix. Percutaneous gastrojejunostomy tube is in good position.  Retroperitoneum: No mass or adenopathy.  Peritoneum: Known thick walled collection in the rectovaginal recess smaller than  08/15/2014 at 40 x 16 mm as compared to 51 x 32 mm. No new collection is identified. No free pneumoperitoneum.  Vascular: 3 cm fusiform infrarenal aortic aneurysm.  OSSEOUS: No acute abnormalities.  IMPRESSION: 1. Near the patient's pyloric Cheree Ditto patch is a transmural defect in the stomach wall, contiguous with the surgical drain. If repeat surgery is considered, oral contrast could confirm an open ulcer. 2. Decreased rectovaginal recess fluid collection, now 4 x 1.5 cm (previously 5 x 3 cm).   Electronically Signed   By: Marnee Spring M.D.   On: 08/22/2014 01:49   Ct Abdomen Pelvis Wo Contrast  08/15/2014   CLINICAL DATA:  Intra abdominal abscess.  EXAM: CT ABDOMEN AND PELVIS WITHOUT CONTRAST  TECHNIQUE: Multidetector CT imaging of the abdomen and pelvis was performed following the standard protocol without IV contrast.  COMPARISON:  CT scan of Aug 03, 2014.  FINDINGS: Severe degenerative disc disease is noted at L5-S1. Minimal subsegmental atelectasis is noted posteriorly in the left lung base.  No gallstones are noted. No focal abnormality is noted in the liver, spleen or pancreas on these unenhanced images. Adrenal glands appear normal. Stable exophytic cyst is seen arising from midpole of left kidney. No hydronephrosis or renal obstruction is noted. No renal or ureteral calculi are noted. 3 cm infrarenal abdominal aortic aneurysm is noted. Gastrojejunostomy tube is seen entering stomach percutaneously and with distal tip in the jejunum. JP surgical drain is seen entering right upper quadrant of abdomen underneath the liver with distal tip in the left upper  quadrant. No significant fluid collection is noted around the drain. Fluid collection is again identified and posterior cul-de-sac of pelvis measuring 5.1 x 3.2 cm. Calcified degenerating fibroid is noted in the uterus. Urinary bladder is unremarkable. There is no evidence of bowel obstruction. The appendix appears normal. No significant adenopathy is  noted. Large midline surgical anterior abdominal wall incision is noted.  IMPRESSION: Stable 3 cm infrarenal abdominal aortic aneurysm.  No change in position of JP surgical drain with distal tip in the left upper quadrant of the abdomen.  Interval placement of percutaneous gastrojejunostomy tube with distal tip in the jejunum.  Grossly stable size and appearance of 5 cm fluid collection seen in posterior cul-de-sac of the pelvis.  Continued presence uterine fibroids.   Electronically Signed   By: Lupita Raider, M.D.   On: 08/15/2014 15:15   Dg Chest 1 View  08/13/2014   CLINICAL DATA:  Shortness of breath  EXAM: CHEST  1 VIEW  COMPARISON:  08/03/2014  FINDINGS: Moderately severe cardiac enlargement. Central line in unchanged position on the right and left side. Mild vascular congestion with no evidence of edema effusion or consolidation.  IMPRESSION: No acute findings   Electronically Signed   By: Esperanza Heir M.D.   On: 08/13/2014 11:04   Dg Chest 2 View  09/01/2014   CLINICAL DATA:  Followup respiratory failure  EXAM: CHEST  2 VIEW  COMPARISON:  08/23/2014  FINDINGS: Moderate cardiac enlargement. Left IJ catheter is identified with tip in the SVC. There is a right-sided dialysis catheter with tips in the SVC. No pleural effusion or edema. No airspace consolidation.  IMPRESSION: 1. Lungs remain clear. 2. Catheters positioned as above.   Electronically Signed   By: Signa Kell M.D.   On: 09/01/2014 09:04   Ct Abdomen Pelvis W Contrast  08/04/2014   CLINICAL DATA:  New epigastric abdominal pain.  EXAM: CT ABDOMEN AND PELVIS WITH CONTRAST  TECHNIQUE: Multidetector CT imaging of the abdomen and pelvis was performed using the standard protocol following bolus administration of intravenous contrast.  CONTRAST:  OMNIPAQUE IOHEXOL 300 MG/ML  SOLN  COMPARISON:  07/16/2014  FINDINGS: There is a small volume extraluminal air alongside the JP drain in the anterior abdominal midline. Source of the air is  not clear. No other extraluminal air is evident in the abdomen or pelvis. No undrained fluid collection is evident alongside the JP drain. There is a 3.5 x 5.7 x 3.0 cm contained collection in the pelvic cul-de-sac which could represent a small abscess.  There is an open midline abdominal wound without associated drainable collection. There is herniation of abdominal fat through the abdominal wall musculature at the location of the wound.  There are normal appearances of the liver, spleen, pancreas, adrenals and kidneys with the exception of a 1.8 cm simple left renal cyst. There is an unchanged 3 cm infrarenal abdominal aortic aneurysm. The duodenum is draped over the aneurysm but appears intact.  There are multiple uterine fibroids.  There is no significant abnormality in the lower chest.  IMPRESSION: *Extraluminal air alongside the JP drain in the anterior abdominal midline, etiology not apparent on this scan *3.0 x 3.5 x 5.7 cm collection in the pelvic cul-de-sac *Unchanged 3 cm infrarenal abdominal aortic aneurysm directly adjacent to the duodenum *Open wound in the abdominal midline. No fluid collection near the wound.   Electronically Signed   By: Ellery Plunk M.D.   On: 08/04/2014 02:24   Ir Gastrostomy  Tube Mod Sed  08/12/2014   CLINICAL DATA:  Perforated duodenal ulcer, post g patch with persistent leak. Percutaneous gastrojejunostomy as requested for gastric decompression and small bowel enteral feeding support.  EXAM: PERC PLACEMENT GASTROSTOMY;  CONVERT G-TUBE TO G-JTUBE  FLUOROSCOPY TIME:  16 minutes 24 seconds, 1016.6 mGy  TECHNIQUE: The procedure, risks, benefits, and alternatives were explained to the patient. Questions regarding the procedure were encouraged and answered. The patient understands and consents to the procedure. As antibiotic prophylaxis, cefazolin 2 g was ordered pre-procedure and administered intravenously within one hour of incision. . A 5 French angiographic catheter was  placed as orogastric tube. The upper abdomen was prepped with Betadine, draped in usual sterile fashion, and infiltrated locally with 1% lidocaine. 1 mg glucagon was administered intravenously to facilitate gastric distention and slow peristalsis. Stomach was insufflated using air through the orogastric tube. An 69 French sheath needle was advanced percutaneously into the gastric lumen under fluoroscopy. Gas could be aspirated and a small contrast injection confirmed intraluminal spread. The sheath was exchanged over a guidewire for a 9 [place] vascular sheath, through which the snare device was advanced and used to snare a guidewire passed through the orogastric tube. This was withdrawn, and the snare attached to the 20 French pull-through gastrostomy tube, which was advanced antegrade, positioned with the internal bumper securing the anterior gastric wall to the anterior abdominal wall. Small contrast injection confirms appropriate positioning. The external bumper was applied and the catheter was flushed.  A 5 French angiographic catheter was placed coaxially through the gastrostomy tube and used to direct an angled stiff glidewire across the pylorus, beyond the proximal duodenal perforation, and into the proximal jejunum just beyond the ligament of Treitz. Over this, a coaxial jejunostomy feeding adapter was advanced. Contrast injection confirmed patency and appropriate position of the gastric and jejunal lumens. No extravasation. The lumens were flushed with saline and capped. The patient tolerated the procedure well.  COMPLICATIONS: COMPLICATIONS none  IMPRESSION: 1. Technically successful 20 French pull-through gastrostomy placement under fluoroscopy. 2. Technically successful coaxial jejunostomy feeding adapter placement to the ligament of Treitz.   Electronically Signed   By: Corlis Leak M.D.   On: 08/12/2014 16:12   Ir Catheter Tube Change  09/01/2014   CLINICAL DATA:  History of a duodenal fistula.  Surgical drain recently fell out and request for drain replacement.  EXAM: REPLACEMENT OF PERCUTANEOUS DRAIN WITH FLUOROSCOPY  Physician: Rachelle Hora. Henn, MD  FLUOROSCOPY TIME:  3 minutes, 246 mGy  MEDICATIONS AND MEDICAL HISTORY: None  ANESTHESIA/SEDATION: Moderate sedation time: None  CONTRAST:  15 mL Omnipaque-300  PROCEDURE: The procedure was explained to the patient. The risks and benefits of the procedure were discussed and the patient's questions were addressed. Informed consent was obtained from the patient. The right upper abdomen was prepped and draped in sterile fashion. Maximal barrier sterile technique was utilized including caps, mask, sterile gowns, sterile gloves, sterile drape, hand hygiene and skin antiseptic. A 5 French catheter was advanced through the old drain site. Contrast was injected as the catheter was advanced into the tissue. Contrast injection demonstrated a connection to the duodenum bulb and stomach. Catheter and wire preferentially went into the stomach via the duodenal bulb. A series of wires were used to advance a 12 French [employer] catheter into the abdomen. The tip of the catheter was cut in order to advance the catheter over a wire. The tip was placed adjacent to the duodenal fistula but not  within the bowel. Catheter was sutured to the skin. Catheter was attached to gravity bag.  FINDINGS: Focal fistula connection between the drain tract and the duodenal bulb. New 12 French catheter was placed within the fistula tract but outside of the bowel.  Estimated blood loss: Minimal  COMPLICATIONS: None  IMPRESSION: Successful replacement of the percutaneous drain. Demonstration of the duodenal bulb fistula. Drain was placed adjacent to the fistula opening but outside of the bowel.   Electronically Signed   By: Richarda Overlie M.D.   On: 09/01/2014 13:39   Ir Adele Schilder Tamsen Snider Convert Gastr-jej Per W/fl Mod Sed  08/12/2014   CLINICAL DATA:  Perforated duodenal ulcer, post g patch with  persistent leak. Percutaneous gastrojejunostomy as requested for gastric decompression and small bowel enteral feeding support.  EXAM: PERC PLACEMENT GASTROSTOMY;  CONVERT G-TUBE TO G-JTUBE  FLUOROSCOPY TIME:  16 minutes 24 seconds, 1016.6 mGy  TECHNIQUE: The procedure, risks, benefits, and alternatives were explained to the patient. Questions regarding the procedure were encouraged and answered. The patient understands and consents to the procedure. As antibiotic prophylaxis, cefazolin 2 g was ordered pre-procedure and administered intravenously within one hour of incision. . A 5 French angiographic catheter was placed as orogastric tube. The upper abdomen was prepped with Betadine, draped in usual sterile fashion, and infiltrated locally with 1% lidocaine. 1 mg glucagon was administered intravenously to facilitate gastric distention and slow peristalsis. Stomach was insufflated using air through the orogastric tube. An 8 French sheath needle was advanced percutaneously into the gastric lumen under fluoroscopy. Gas could be aspirated and a small contrast injection confirmed intraluminal spread. The sheath was exchanged over a guidewire for a 9 [place] vascular sheath, through which the snare device was advanced and used to snare a guidewire passed through the orogastric tube. This was withdrawn, and the snare attached to the 20 French pull-through gastrostomy tube, which was advanced antegrade, positioned with the internal bumper securing the anterior gastric wall to the anterior abdominal wall. Small contrast injection confirms appropriate positioning. The external bumper was applied and the catheter was flushed.  A 5 French angiographic catheter was placed coaxially through the gastrostomy tube and used to direct an angled stiff glidewire across the pylorus, beyond the proximal duodenal perforation, and into the proximal jejunum just beyond the ligament of Treitz. Over this, a coaxial jejunostomy feeding adapter  was advanced. Contrast injection confirmed patency and appropriate position of the gastric and jejunal lumens. No extravasation. The lumens were flushed with saline and capped. The patient tolerated the procedure well.  COMPLICATIONS: COMPLICATIONS none  IMPRESSION: 1. Technically successful 20 French pull-through gastrostomy placement under fluoroscopy. 2. Technically successful coaxial jejunostomy feeding adapter placement to the ligament of Treitz.   Electronically Signed   By: Corlis Leak M.D.   On: 08/12/2014 16:12   Dg Chest Port 1 View  08/23/2014   CLINICAL DATA:  Left chest pain  EXAM: PORTABLE CHEST - 1 VIEW  COMPARISON:  08/18/2014  FINDINGS: Patient is rotated.  Lungs are essentially clear. No focal consolidation. No pleural effusion or pneumothorax.  Cardiomegaly.  Left IJ venous catheter terminates at the cavoatrial junction. Right IJ dual lumen dialysis catheter terminates in the lower SVC.  IMPRESSION: No evidence of acute cardiopulmonary disease.   Electronically Signed   By: Charline Bills M.D.   On: 08/23/2014 19:17   Dg Chest Port 1 View  08/18/2014   CLINICAL DATA:  Shortness of breath at rest.  Having hemodialysis.  EXAM: PORTABLE  CHEST - 1 VIEW  COMPARISON:  08/15/2014.  FINDINGS: The cardiac silhouette remains mildly enlarged. Stable right jugular double-lumen catheter. Clear lungs. The pulmonary vasculature remains prominent. No pleural fluid. Minimal right shoulder degenerative changes.  IMPRESSION: Stable cardiomegaly and pulmonary vascular congestion.   Electronically Signed   By: Beckie Salts M.D.   On: 08/18/2014 09:47   Dg Chest Port 1 View  08/15/2014   CLINICAL DATA:  Shortness of breath.  EXAM: PORTABLE CHEST - 1 VIEW  COMPARISON:  08/13/2014 and multiple prior chest radiographs  FINDINGS: Cardiomegaly and pulmonary vascular congestion again noted.  A right IJ central venous catheter is noted with tips overlying the upper and mid SVC.  A left central venous catheter is  present with tip overlying the lower SVC.  There is no evidence of focal airspace disease, pulmonary edema, suspicious pulmonary nodule/mass, pleural effusion, or pneumothorax. No acute bony abnormalities are identified.  IMPRESSION: Cardiomegaly with pulmonary vascular congestion.   Electronically Signed   By: Harmon Pier M.D.   On: 08/15/2014 17:28   Dg Abd Portable 1v  08/23/2014   CLINICAL DATA:  JP drain, broken, initial encounter  EXAM: PORTABLE ABDOMEN - 1 VIEW  COMPARISON:  CT abdomen pelvis dated 08/21/2014  FINDINGS: A portion of the right flank is excluded from this single image.  The patient's right abdominal surgical drain is not visualized.  Gastrojejunostomy in satisfactory position.  IMPRESSION: The patient's right abdominal surgical drain is not visualized.  Gastrojejunostomy in satisfactory position.   Electronically Signed   By: Charline Bills M.D.   On: 08/23/2014 19:11   Ct Image Guided Fluid Drain By Catheter  08/17/2014   CLINICAL DATA:  Postoperative intra-abdominal abscess, subsequent encounter. Small pelvic fluid collection.  EXAM: CT-GUIDED DRAIN PLACEMENT IN PELVIC FLUID COLLECTION  Physician: Rachelle Hora. Lowella Dandy, MD  FLUOROSCOPY TIME:  None  MEDICATIONS: 50 mcg fentanyl  ANESTHESIA/SEDATION: Patient was monitored by a radiology nurse during the procedure.  PROCEDURE: Informed consent was obtained for drain placement. Patient was placed on her left side. Images through the pelvis were obtained. The right buttock was prepped and draped in sterile fashion. 1% lidocaine was used for a local anesthetic. An 18 gauge needle was directed into the pelvic fluid collection with CT guidance from a transgluteal approach. Thick yellow purulent fluid was aspirated. A stiff Amplatz wire was placed. The tract was dilated to accommodate a 10.2 [place] multipurpose drain. It was technically difficult to advance the drain into the collection due to the small size of the collection and the patient's body  habitus. Eventually, the drain was successfully placed within the collection. 5 mL of yellow purulent fluid was obtained. Catheter was sutured to the skin and attached to a suction bulb. Fluid was sent for culture.  FINDINGS: Small fluid collection just posterior to the uterus. A total of 5 mL of yellow purulent fluid was removed.  Estimated blood loss: Minimal  COMPLICATIONS: None  IMPRESSION: CT-guided placement of a drainage catheter in the small pelvic fluid collection.   Electronically Signed   By: Richarda Overlie M.D.   On: 08/17/2014 17:21        Subjective: Still reports some abd pain, tolerating liquids   Objective: Filed Vitals:   09/02/14 1500 09/02/14 1515 09/02/14 1530 09/02/14 1600  BP: 112/73  102/71 100/75  Pulse: 118  119 117  Temp:    98.3 F (36.8 C)  TempSrc:    Oral  Resp: 32  28 34  Height:      Weight:      SpO2: 96% 100% 98% 97%    Intake/Output Summary (Last 24 hours) at 09/02/14 1730 Last data filed at 09/02/14 1500  Gross per 24 hour  Intake   1920 ml  Output   2775 ml  Net   -855 ml   Weight change:  Exam:   General:  Pt is alert, follows commands appropriately, not in acute distress, chronically ill appearing  HEENT: No icterus, No thrush, no meningismus [place]/AT  Cardiovascular: RRR, S1/S2, no rubs, no gallops  Respiratory: Bibasilar expiratory wheeze. Good air movement.  Abdomen: Soft/+BS, non tender, non distended, no guarding, 2drains noted, L sided drain with mod amount of greenish output and R sided drain without any output  Extremities: trace LE edema, No lymphangitis, No petechiae, No rashes, no synovitis  Data Reviewed: Basic Metabolic Panel:  Recent Labs Lab 08/27/14 0539 08/29/14 0520 09/01/14 1430  NA 132* 140 142  K 3.5 3.5 3.7  CL 96* 102 95*  CO2 27 28 28   GLUCOSE 457* 104* 104*  BUN 34* 32* 43*  CREATININE 4.56* 4.48* 5.72*  CALCIUM 7.8* 8.3* 8.2*  MG 1.7  --   --   PHOS 4.4 4.4  --    Liver Function  Tests:  Recent Labs Lab 08/27/14 0539 08/29/14 0520  AST 17  --   ALT 8*  --   ALKPHOS 108  --   BILITOT 1.0  --   PROT 6.6  --   ALBUMIN 1.9* 1.9*   No results for input(s): LIPASE, AMYLASE in the last 168 hours. No results for input(s): AMMONIA in the last 168 hours. CBC:  Recent Labs Lab 08/28/14 2025 08/29/14 0520 08/29/14 2008 08/31/14 0600 09/01/14 1430  WBC 15.7* 14.3* 20.5* 11.3* 11.1*  HGB 9.7* 9.1* 9.9* 9.1* 9.0*  HCT 31.0* 29.3* 31.0* 29.7* 28.7*  MCV 94.5 94.8 93.9 95.8 93.8  PLT 336 303 229 232 252   Cardiac Enzymes: No results for input(s): CKTOTAL, CKMB, CKMBINDEX, TROPONINI in the last 168 hours. BNP: Invalid input(s): POCBNP CBG:  Recent Labs Lab 08/27/14 1218 08/27/14 1817 09/01/14 2350  GLUCAP 112* 157* 97    Recent Results (from the past 240 hour(s))  Clostridium Difficile by PCR     Status: None   Collection Time: 08/31/14  2:35 PM  Result Value Ref Range Status   C difficile by pcr NEGATIVE NEGATIVE Final     Scheduled Meds: . sodium chloride   Intravenous Once  . ALPRAZolam  0.5 mg Oral Q T,Th,Sa-HD  . amiodarone  400 mg Oral BID  . antiseptic oral rinse  7 mL Mouth Rinse q12n4p  . budesonide (PULMICORT) nebulizer solution  0.25 mg Nebulization BID  . cefTAZidime (FORTAZ)  IV  2 g Intravenous Q T,Th,Sa-HD  . chlorhexidine  15 mL Mouth Rinse BID  . darbepoetin (ARANESP) injection - DIALYSIS  200 mcg Intravenous Q Thu-HD  . feeding supplement (VITAL AF 1.2 CAL)  1,000 mL Per Tube Q24H  . ipratropium  0.5 mg Nebulization TID  . levalbuterol  1.25 mg Nebulization TID  . levothyroxine  12.5 mcg Intravenous Daily  . [START ON 09/03/2014] LORazepam  1-2 mg Intravenous Q T,Th,Sa-HD  . metoprolol tartrate  12.5 mg Oral BID  . metronidazole  500 mg Intravenous Q8H  . sodium chloride  10-40 mL Intracatheter Q12H   Continuous Infusions: . sodium chloride 10 mL/hr at 08/19/14 0950     Zannie Cove, MD  [facility] Pager  [phone]  If 7PM-7AM, please contact night-coverage [email or web address] Password [email or web address] 09/02/2014, 5:30 PM   LOS: 77 days

## 2014-09-02 NOTE — Progress Notes (Signed)
Physical Therapy Treatment Patient Details Name: [name] MRN: [id number] DOB: 12-Sep-1955 Today's Date: 09/02/2014    History of Present Illness [name] is a 59 y.o. female with a history of CKD and hypertension [facility] complaining of shortness of breath and generalized weakness x 2 weeks. Admitted with acute hypoxemic respiratory failure. During hospital stay noted to have weakness of her left side. 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  She developed septic shock 4/1 with pressors weaned off 4/3.  Began CRRT and pressors restarted 4/4; Developed pelvic abcess 4/7 with pelvic drain placed 4/8.  weaned off pressors 4/9.  Runs of SVT 4/5 and 4/11. Prolonged hospital course with hypoxemia and hypotension. Right abcominal drain accidentally pulled 5/30 and replaced 5/31    PT Comments    Pt with increased gait and trials of mobility today with encouragement and music played to motivate pt. Cues for sequence and safety with positive reinforcement throughout. Pt encouraged to continue mobility with nursing and perform HEP. Will continue to follow. Sats 94% on 2L with gait, 97% on 1L at rest. HR 112-125  Follow Up Recommendations  CIR;Supervision/Assistance - 24 hour     Equipment Recommendations       Recommendations for Other Services       Precautions / Restrictions Precautions Precautions: Fall Precaution Comments: abdominal drains,  G tube, abdominal binder loosely at all times Required Braces or Orthoses: Other Brace/Splint Other Brace/Splint: abdominal binder    Mobility  Bed Mobility Overal bed mobility: Needs Assistance   Rolling: Min guard Sidelying to sit: Min assist       General bed mobility comments: cues for sequence with assist to scoot hips to EOB and use of rail to roll and elevate  trunk  Transfers Overall transfer level: Needs assistance   Transfers: Sit to/from Stand Sit to Stand: Min assist;Mod assist         General transfer comment: min assist from elevated bed, mod assist x 2 from recliner and last stand from recliner min with cues for hand placement, sequence and anterior translation  Ambulation/Gait Ambulation/Gait assistance: Min assist;+2 safety/equipment Ambulation Distance (Feet): 50 Feet Assistive device: Rolling walker (2 wheeled) Gait Pattern/deviations: Step-through pattern;Decreased stride length;Trunk flexed;Narrow base of support   Gait velocity interpretation: Below normal speed for age/gender General Gait Details: Pt walked 38', 38', 50', 52', with grossly 3 min seated rest between each bout. Played music for pt and followed behind with chair which helped motivate and encourage pt. Cues for posture, position in Rw and stride length   Stairs            Wheelchair Mobility    Modified Rankin (Stroke Patients Only)       Balance Overall balance assessment: Needs assistance   Sitting balance-Leahy Scale: Fair       Standing balance-Leahy Scale: Poor                      Cognition Arousal/Alertness: Awake/alert Behavior During Therapy: WFL for tasks assessed/performed Overall Cognitive Status: Within Functional Limits for tasks assessed                      Exercises      General Comments        Pertinent Vitals/Pain Pain Score: 4  Pain Location: stomach and back Pain Descriptors / Indicators: Aching Pain Intervention(s): Limited activity within patient's tolerance;Repositioned    Home Living                      Prior Function            PT Goals (current goals can now be found in the care plan section) Progress towards PT goals: Progressing toward goals    Frequency       PT Plan Current plan remains appropriate    Co-evaluation             End of Session  Equipment Utilized During Treatment: Gait belt;Oxygen Activity Tolerance: Patient tolerated treatment well Patient left: in chair;with call bell/phone within reach;with nursing/sitter in room     Time: 0813-0858 PT Time Calculation (min) (ACUTE ONLY): 45 min  Charges:  $Gait Training: 23-37 mins $Therapeutic Activity: 8-22 mins                    G Codes:      Delorse Lek 09/16/2014, 9:22 AM Delaney Meigs, PT [phone]

## 2014-09-02 NOTE — Progress Notes (Signed)
Rehab admissions - Following from a distance.  Suspect she will need SNF at discharge due to prolonged LOS and deconditioning.  I am open to inpatient rehab once patient is medically stable and ready for inpatient rehab admission.  Call me for questions.  #[phone]

## 2014-09-02 NOTE — Progress Notes (Signed)
63 Days Post-Op  Subjective: She is on a breathing treatment, only complaint is pain at night upper left abdomen.  Open wound looks fine, nothing coming out of the drain placed yesterday.  Objective: Vital signs in last 24 hours: Temp:  [97.8 F (36.6 C)-98.7 F (37.1 C)] 98.1 F (36.7 C) (06/01 0735) Pulse Rate:  [93-120] 117 (06/01 0735) Resp:  [21-26] 26 (06/01 0735) BP: (95-123)/(35-78) 104/68 mmHg (06/01 0735) SpO2:  [99 %-100 %] 100 % (06/01 0751) Weight:  [96.2 kg (212 lb 1.3 oz)-97 kg (213 lb 13.5 oz)] 96.5 kg (212 lb 11.9 oz) (06/01 0413) Last BM Date: 09/01/14 PO 480 Stool x 2 Afebrile, VSS WBC stable, creatinine is up Intake/Output from previous day: 05/31 0701 - 06/01 0700 In: 1733.5 [P.O.:480; I.V.:113.5; NG/GT:990; IV Piggyback:150] Out: 3275 [Urine:100; Drains:400] Intake/Output this shift:    General appearance: alert, no distress and chronically ill. GI: soft, sore, + BS.  Open wound ok, nothing from drain placed yesterday.    Lab Results:   Recent Labs  08/31/14 0600 09/01/14 1430  WBC 11.3* 11.1*  HGB 9.1* 9.0*  HCT 29.7* 28.7*  PLT 232 252    BMET  Recent Labs  09/01/14 1430  NA 142  K 3.7  CL 95*  CO2 28  GLUCOSE 104*  BUN 43*  CREATININE 5.72*  CALCIUM 8.2*   PT/INR No results for input(s): LABPROT, INR in the last 72 hours.   Recent Labs Lab 08/27/14 0539 08/29/14 0520  AST 17  --   ALT 8*  --   ALKPHOS 108  --   BILITOT 1.0  --   PROT 6.6  --   ALBUMIN 1.9* 1.9*     Lipase  No results found for: LIPASE   Studies/Results: Dg Chest 2 View  09/01/2014   CLINICAL DATA:  Followup respiratory failure  EXAM: CHEST  2 VIEW  COMPARISON:  08/23/2014  FINDINGS: Moderate cardiac enlargement. Left IJ catheter is identified with tip in the SVC. There is a right-sided dialysis catheter with tips in the SVC. No pleural effusion or edema. No airspace consolidation.  IMPRESSION: 1. Lungs remain clear. 2. Catheters positioned as above.    Electronically Signed   By: Signa Kell M.D.   On: 09/01/2014 09:04   Ir Catheter Tube Change  09/01/2014   CLINICAL DATA:  History of a duodenal fistula. Surgical drain recently fell out and request for drain replacement.  EXAM: REPLACEMENT OF PERCUTANEOUS DRAIN WITH FLUOROSCOPY  Physician: Rachelle Hora. Henn, MD  FLUOROSCOPY TIME:  3 minutes, 246 mGy  MEDICATIONS AND MEDICAL HISTORY: None  ANESTHESIA/SEDATION: Moderate sedation time: None  CONTRAST:  15 mL Omnipaque-300  PROCEDURE: The procedure was explained to the patient. The risks and benefits of the procedure were discussed and the patient's questions were addressed. Informed consent was obtained from the patient. The right upper abdomen was prepped and draped in sterile fashion. Maximal barrier sterile technique was utilized including caps, mask, sterile gowns, sterile gloves, sterile drape, hand hygiene and skin antiseptic. A 5 French catheter was advanced through the old drain site. Contrast was injected as the catheter was advanced into the tissue. Contrast injection demonstrated a connection to the duodenum bulb and stomach. Catheter and wire preferentially went into the stomach via the duodenal bulb. A series of wires were used to advance a 12 French [employer] catheter into the abdomen. The tip of the catheter was cut in order to advance the catheter over a wire. The  tip was placed adjacent to the duodenal fistula but not within the bowel. Catheter was sutured to the skin. Catheter was attached to gravity bag.  FINDINGS: Focal fistula connection between the drain tract and the duodenal bulb. New 12 French catheter was placed within the fistula tract but outside of the bowel.  Estimated blood loss: Minimal  COMPLICATIONS: None  IMPRESSION: Successful replacement of the percutaneous drain. Demonstration of the duodenal bulb fistula. Drain was placed adjacent to the fistula opening but outside of the bowel.   Electronically Signed   By: Richarda OverlieAdam   Henn M.D.   On: 09/01/2014 13:39    Medications: . sodium chloride   Intravenous Once  . ALPRAZolam  0.5 mg Oral Q T,Th,Sa-HD  . amiodarone  400 mg Oral BID  . antiseptic oral rinse  7 mL Mouth Rinse q12n4p  . budesonide (PULMICORT) nebulizer solution  0.25 mg Nebulization BID  . cefTAZidime (FORTAZ)  IV  2 g Intravenous Q T,Th,Sa-HD  . chlorhexidine  15 mL Mouth Rinse BID  . darbepoetin (ARANESP) injection - DIALYSIS  200 mcg Intravenous Q Thu-HD  . feeding supplement (VITAL AF 1.2 CAL)  1,000 mL Per Tube Q24H  . ipratropium  0.5 mg Nebulization TID  . levalbuterol  1.25 mg Nebulization TID  . levothyroxine  12.5 mcg Intravenous Daily  . metoprolol tartrate  12.5 mg Oral BID  . metronidazole  500 mg Intravenous Q8H  . sodium chloride  10-40 mL Intracatheter Q12H  . vitamin C  1,000 mg Per Tube Daily    Assessment/Plan SOB/hypoxia/volume over load 06/17/14 Pneumoperitoneum 07/01/14 Perforated pyloric ulcer with diffuse peritonitis, ischemic necrosis of mid ileum, possibly embolic Exploratory laparotomy, evacuation of ascites, closure of perforated pyloric ulcer with omental patch, segmental resection of 14 inches of ileum with primary anastomosis Doppler evaluation of small bowel mesenteric arterial vessels.07/01/14, Dr. 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  Luisa Hartornett and we will give her full liquids and see how she does, tract should be matured by now.   Continue wet to dry dressing of the  abdomen.    LOS: 77 days    [name] 09/02/2014

## 2014-09-02 NOTE — Progress Notes (Signed)
[facility] Progress Note  Assessment/Plan:  1.  New ESRD 1. On TTS schedule as inpatient 2. Using TDC 3. CLIP on hold as still significant inpatient issues 4. Has AVF from 01/28/14; will need to address prior to DC 1. Last seen by [facility] 06/2014; likely needing revision [facility] 5. 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 Cont with 4K bath next for rhythm prevention 7. Panic w HD > will try rinsing dialyzer w 2L saline and also IV ativan pre HD 2. Vol Status 1. Sig leg edema bilat, having trouble due to hypotension w HD on midodrine, bad RV and SVT 3. Pseudomonal pelvic abscess on Ceftaz/Flagyl, hx/o C Diff 4. CVA acute by MRI 3/19 5. Perf pyloric ulcer SP exlap, w duod fistula to suction high output, CCS following 6. Nutrition w G-J tube on TF's and eating solids 7. C diff - s/p po Vanc 8. Anemia  1. aranesp to 200 qThurs 2. Finished Fe load 5/21 3. Transfused 2u PRBC 08/27/14 9. MBD - iPTH 130 5/12, low P resolved  10. Parox SVT on IV MTP q 4hrs.  Restarted Amio 5/28 11. 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  Plan - HD tomorrow, check doppler L leg; rinse dialyzer and give Ativan w HD   Stephanie Moselleob Love Milbourne MD (pgr) [phone]    ([phone]) [phone] 09/02/2014, 10:26 AM   Subjective:    HD yesterday wasn't done due to severe anxiety after getting on the machine   Objective Filed Vitals:   09/02/14 0735 09/02/14 0751 09/02/14 0918 09/02/14 1000  BP: 104/68   90/50  Pulse: 117  122 110  Temp: 98.1 F (36.7 C)     TempSrc: Oral     Resp: 26     Height:      Weight:      SpO2: 100% 100% 97%    Physical Exam General: Walking with PT Heart:tachy reg Lungs:clear to A/P now Abdomen: obese, anasarca with 2+ dependent back/buttock edema Extremities: 2+ dependent edema LLE > RLE Dialysis Access: right IJ   Dialysis Orders:  4h  UF max   Heparin none   Max blood flow  Additional Objective Labs: Basic  Metabolic Panel:  Recent Labs Lab 08/27/14 0539 08/29/14 0520 09/01/14 1430  NA 132* 140 142  K 3.5 3.5 3.7  CL 96* 102 95*  CO2 27 28 28   GLUCOSE 457* 104* 104*  BUN 34* 32* 43*  CREATININE 4.56* 4.48* 5.72*  CALCIUM 7.8* 8.3* 8.2*  PHOS 4.4 4.4  --    Liver Function Tests:  Recent Labs Lab 08/27/14 0539 08/29/14 0520  AST 17  --   ALT 8*  --   ALKPHOS 108  --   BILITOT 1.0  --   PROT 6.6  --   ALBUMIN 1.9* 1.9*  CBC:  Recent Labs Lab 08/28/14 2025 08/29/14 0520 08/29/14 2008 08/31/14 0600 09/01/14 1430  WBC 15.7* 14.3* 20.5* 11.3* 11.1*  HGB 9.7* 9.1* 9.9* 9.1* 9.0*  HCT 31.0* 29.3* 31.0* 29.7* 28.7*  MCV 94.5 94.8 93.9 95.8 93.8  PLT 336 303 229 232 252   Blood Culture    Component Value Date/Time   SDES ABSCESS PELVIS 08/17/2014 1451   SPECREQUEST NONE 08/17/2014 1451   CULT  08/17/2014 1451    NO GROWTH 3 DAYS [facility]    REPTSTATUS 08/21/2014 FINAL 08/17/2014 1451  Cardiac Enzymes: No results for input(s): CKTOTAL, CKMB, CKMBINDEX, TROPONINI in the last 168 hours. CBG:  Recent Labs Lab 08/27/14 1218 08/27/14 1817 09/01/14 2350  GLUCAP 112* 157* 97    Medications: . sodium chloride 10 mL/hr at 08/19/14 0950   . sodium chloride   Intravenous Once  . ALPRAZolam  0.5 mg Oral Q T,Th,Sa-HD  . amiodarone  400 mg Oral BID  . antiseptic oral rinse  7 mL Mouth Rinse q12n4p  . budesonide (PULMICORT) nebulizer solution  0.25 mg Nebulization BID  . cefTAZidime (FORTAZ)  IV  2 g Intravenous Q T,Th,Sa-HD  . chlorhexidine  15 mL Mouth Rinse BID  . darbepoetin (ARANESP) injection - DIALYSIS  200 mcg Intravenous Q Thu-HD  . feeding supplement (VITAL AF 1.2 CAL)  1,000 mL Per Tube Q24H  . ipratropium  0.5 mg Nebulization TID  . levalbuterol  1.25 mg Nebulization TID  . levothyroxine  12.5 mcg Intravenous Daily  . metoprolol tartrate  12.5 mg Oral BID  . metronidazole  500 mg Intravenous Q8H  . sodium chloride  10-40 mL  Intracatheter Q12H

## 2014-09-02 NOTE — Progress Notes (Signed)
TELEMETRY: Reviewed telemetry pt in  sinus tachycardia, some episodes of paroxysmal atrial tachycardia with rates up to 140s. Patient is not aware.: Filed Vitals:   09/02/14 0735 09/02/14 0751 09/02/14 0918 09/02/14 1000  BP: 104/68   90/50  Pulse: 117  122 110  Temp: 98.1 F (36.7 C)     TempSrc: Oral     Resp: 26     Height:      Weight:      SpO2: 100% 100% 97%     Intake/Output Summary (Last 24 hours) at 09/02/14 1050 Last data filed at 09/02/14 1009  Gross per 24 hour  Intake 1690.1 ml  Output   2975 ml  Net -1284.9 ml   Filed Weights   09/01/14 1630 09/01/14 2035 09/02/14 0413  Weight: 97 kg (213 lb 13.5 oz) 96.2 kg (212 lb 1.3 oz) 96.5 kg (212 lb 11.9 oz)    Subjective Now on full liquids- states it goes right through her. No chest pain or palpitations. Complains of SOB with  dialysis.  Marland Kitchen sodium chloride   Intravenous Once  . ALPRAZolam  0.5 mg Oral Q T,Th,Sa-HD  . amiodarone  400 mg Oral BID  . antiseptic oral rinse  7 mL Mouth Rinse q12n4p  . budesonide (PULMICORT) nebulizer solution  0.25 mg Nebulization BID  . cefTAZidime (FORTAZ)  IV  2 g Intravenous Q T,Th,Sa-HD  . chlorhexidine  15 mL Mouth Rinse BID  . darbepoetin (ARANESP) injection - DIALYSIS  200 mcg Intravenous Q Thu-HD  . feeding supplement (VITAL AF 1.2 CAL)  1,000 mL Per Tube Q24H  . ipratropium  0.5 mg Nebulization TID  . levalbuterol  1.25 mg Nebulization TID  . levothyroxine  12.5 mcg Intravenous Daily  . [START ON 09/03/2014] LORazepam  1-2 mg Intravenous Q T,Th,Sa-HD  . metoprolol tartrate  12.5 mg Oral BID  . metronidazole  500 mg Intravenous Q8H  . sodium chloride  10-40 mL Intracatheter Q12H   . sodium chloride 10 mL/hr at 08/19/14 0950    LABS: Basic Metabolic Panel:  Recent Labs  16/10/96 1430  NA 142  K 3.7  CL 95*  CO2 28  GLUCOSE 104*  BUN 43*  CREATININE 5.72*  CALCIUM 8.2*   Liver Function Tests: No results for input(s): AST, ALT, ALKPHOS, BILITOT, PROT, ALBUMIN  in the last 72 hours. No results for input(s): LIPASE, AMYLASE in the last 72 hours. CBC:  Recent Labs  08/31/14 0600 09/01/14 1430  WBC 11.3* 11.1*  HGB 9.1* 9.0*  HCT 29.7* 28.7*  MCV 95.8 93.8  PLT 232 252   Cardiac Enzymes: No results for input(s): CKTOTAL, CKMB, CKMBINDEX, TROPONINI in the last 72 hours. BNP: No results for input(s): PROBNP in the last 72 hours. D-Dimer: No results for input(s): DDIMER in the last 72 hours. Hemoglobin A1C: No results for input(s): HGBA1C in the last 72 hours. Fasting Lipid Panel: No results for input(s): CHOL, HDL, LDLCALC, TRIG, CHOLHDL, LDLDIRECT in the last 72 hours. Thyroid Function Tests: No results for input(s): TSH, T4TOTAL, T3FREE, THYROIDAB in the last 72 hours.  Invalid input(s): FREET3   Radiology/Studies:  Dg Chest 2 View  09/01/2014   CLINICAL DATA:  Followup respiratory failure  EXAM: CHEST  2 VIEW  COMPARISON:  08/23/2014  FINDINGS: Moderate cardiac enlargement. Left IJ catheter is identified with tip in the SVC. There is a right-sided dialysis catheter with tips in the SVC. No pleural effusion or edema. No airspace consolidation.  IMPRESSION: 1. Lungs  remain clear. 2. Catheters positioned as above.   Electronically Signed   By: Signa Kell M.D.   On: 09/01/2014 09:04   Ir Catheter Tube Change  09/01/2014   CLINICAL DATA:  History of a duodenal fistula. Surgical drain recently fell out and request for drain replacement.  EXAM: REPLACEMENT OF PERCUTANEOUS DRAIN WITH FLUOROSCOPY  Physician: Rachelle Hora. Henn, MD  FLUOROSCOPY TIME:  3 minutes, 246 mGy  MEDICATIONS AND MEDICAL HISTORY: None  ANESTHESIA/SEDATION: Moderate sedation time: None  CONTRAST:  15 mL Omnipaque-300  PROCEDURE: The procedure was explained to the patient. The risks and benefits of the procedure were discussed and the patient's questions were addressed. Informed consent was obtained from the patient. The right upper abdomen was prepped and draped in sterile  fashion. Maximal barrier sterile technique was utilized including caps, mask, sterile gowns, sterile gloves, sterile drape, hand hygiene and skin antiseptic. A 5 French catheter was advanced through the old drain site. Contrast was injected as the catheter was advanced into the tissue. Contrast injection demonstrated a connection to the duodenum bulb and stomach. Catheter and wire preferentially went into the stomach via the duodenal bulb. A series of wires were used to advance a 12 French [employer] catheter into the abdomen. The tip of the catheter was cut in order to advance the catheter over a wire. The tip was placed adjacent to the duodenal fistula but not within the bowel. Catheter was sutured to the skin. Catheter was attached to gravity bag.  FINDINGS: Focal fistula connection between the drain tract and the duodenal bulb. New 12 French catheter was placed within the fistula tract but outside of the bowel.  Estimated blood loss: Minimal  COMPLICATIONS: None  IMPRESSION: Successful replacement of the percutaneous drain. Demonstration of the duodenal bulb fistula. Drain was placed adjacent to the fistula opening but outside of the bowel.   Electronically Signed   By: Richarda Overlie M.D.   On: 09/01/2014 13:39    Echo: 07/23/14: Study Conclusions  - Left ventricle: The cavity size was normal. There was moderate concentric hypertrophy. Systolic function was normal. The estimated ejection fraction was in the range of 60% to 65%. Wall motion was normal; there were no regional wall motion abnormalities. Doppler parameters are consistent with abnormal left ventricular relaxation (grade 1 diastolic dysfunction). There was no evidence of elevated ventricular filling pressure by Doppler parameters. - Ventricular septum: The contour showed diastolic flattening and systolic flattening consistent with right ventricular volume and pressure overload . - Aortic valve: Trileaflet; mildly  thickened, mildly calcified leaflets. Transvalvular velocity was within the normal range. There was no stenosis. There was no regurgitation. - Mitral valve: Mildly thickened leaflets . There was no regurgitation. - Right ventricle: The cavity size was severely dilated. Wall thickness was normal. Systolic function was mildly to moderately reduced. - Right atrium: The atrium was severely dilated. - Tricuspid valve: There was moderate-severe regurgitation. - Pulmonic valve: There was no regurgitation. - Pulmonary arteries: The main pulmonary artery was normal-sized. Systolic pressure was severely increased. PA peak pressure: 90 mm Hg (S). - Inferior vena cava: The vessel was normal in size. - Pericardium, extracardiac: There was no pericardial effusion.  Impressions:  - Normal LV systolic function, impaired relaxation with normal filling pressures. Severely dilated right sided chamber and severe pulmonary hypertension consistent with cor pulmonale.   PHYSICAL EXAM General: Well developed, chronically ill, in no acute distress. Head: Normocephalic, atraumatic, sclera non-icteric, oropharynx is clear Neck: Normal Lungs: Bilateral  coarse rhonchi and wheezing. Breathing is unlabored. Heart: RRR S1 S2 without murmurs, rubs, or gallops.  Abdomen: Soft, non-tender, non-distended with normoactive bowel sounds.  Extremities: No clubbing, cyanosis or edema.  Distal pedal pulses are 2+ and equal bilaterally. Neuro: Alert and oriented X 3. Moves all extremities spontaneously. Psych:  Responds to questions appropriately with a normal affect.  ASSESSMENT AND PLAN:   The patient has a long and complex history. She has been hospitalized since March, 2016. She has had supraventricular tachycardia. 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 Beta blockade helps some. Currently her blood pressure is on the low side and there is hesitation to give her beta blocker. The patient  had a CVA during this admission. 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 There has been no documented atrial fibrillation. It has been felt that amiodarone is the only drug that will definitely help with her rhythm. She was on amiodarone for a period of time. This included oral amiodarone. She could no longer take oral meds after while in the amiodarone was stopped. She has been seen by the cardiology team intermittently since March, 2016.   1. PSVT-  atrial tachycardia. Now on oral metoprolol and amiodarone (metoprolol 12.5 mg bid and amiodarone 400 mg bid.) will continue for now. If BP tolerates we can titrate metoprolol tomorrow.   2. ESRD on dialysis  3. History of CVA. No documented Afib.  4. Copd with cor pulmonale. Lungs ith less wheezing today. CXR yesterday was clear.   5. S/p exploratory lap with closure of perforated pyloric ulcer/drainage of abdominal abscess. Post op ileus and enterocutaneous fistula. Pelvic abscess.  Present on Admission:  . Acute respiratory failure with hypoxia . Cerebral thrombosis with cerebral infarction . Hypertension . Obesity (BMI 30-39.9) . Depression . Left renal mass . Chronic diastolic heart failure . H1N1 influenza . Tobacco use disorder . Renal failure (ARF), acute on chronic . Hyperlipidemia . SOB (shortness of breath) . Perforated gastric ulcer . ESRD needing dialysis  Signed, Peter Swazil[place]Jordan, MDFACC 09/02/2014 10:50 AM

## 2014-09-03 ENCOUNTER — Inpatient Hospital Stay (HOSPITAL_COMMUNITY): Payer: Medicaid Other

## 2014-09-03 LAB — BASIC METABOLIC PANEL
ANION GAP: 14 (ref 5–15)
BUN: 37 mg/dL — ABNORMAL HIGH (ref 6–20)
CO2: 26 mmol/L (ref 22–32)
Calcium: 8.2 mg/dL — ABNORMAL LOW (ref 8.9–10.3)
Chloride: 97 mmol/L — ABNORMAL LOW (ref 101–111)
Creatinine, Ser: 4.67 mg/dL — ABNORMAL HIGH (ref 0.44–1.00)
GFR calc Af Amer: 11 mL/min — ABNORMAL LOW (ref >60)
GFR calc non Af Amer: 9 mL/min — ABNORMAL LOW (ref >60)
Glucose, Bld: 93 mg/dL (ref 65–99)
Potassium: 4.2 mmol/L (ref 3.5–5.1)
Sodium: 137 mmol/L (ref 135–145)

## 2014-09-03 LAB — CBC
HCT: 29.7 % — ABNORMAL LOW (ref 36.0–46.0)
Hemoglobin: 9.3 g/dL — ABNORMAL LOW (ref 12.0–15.0)
MCH: 29.6 pg (ref 26.0–34.0)
MCHC: 31.3 g/dL (ref 30.0–36.0)
MCV: 94.6 fL (ref 78.0–100.0)
Platelets: 208 10*3/uL (ref 150–400)
RBC: 3.14 MIL/uL — ABNORMAL LOW (ref 3.87–5.11)
RDW: 22.8 % — AB (ref 11.5–15.5)
WBC: 11.5 10*3/uL — ABNORMAL HIGH (ref 4.0–10.5)

## 2014-09-03 MED ORDER — ALPRAZOLAM 0.5 MG PO TABS
ORAL_TABLET | ORAL | Status: AC
  Administered 2014-09-03: 16:00:00
  Filled 2014-09-03: qty 1

## 2014-09-03 MED ORDER — NEPRO/CARBSTEADY PO LIQD
237.0000 mL | ORAL | Status: DC | PRN
  Filled 2014-09-03: qty 237

## 2014-09-03 MED ORDER — LIDOCAINE HCL (PF) 1 % IJ SOLN: 5.0000 mL | INTRAMUSCULAR | Status: DC | PRN

## 2014-09-03 MED ORDER — HEPARIN SODIUM (PORCINE) 1000 UNIT/ML DIALYSIS: 1000.0000 [IU] | INTRAMUSCULAR | Status: DC | PRN

## 2014-09-03 MED ORDER — DIPHENOXYLATE-ATROPINE 2.5-0.025 MG PO TABS
1.0000 | ORAL_TABLET | Freq: Four times a day (QID) | ORAL | Status: DC | PRN
  Administered 2014-09-03: 1 via ORAL
  Filled 2014-09-03: qty 1

## 2014-09-03 MED ORDER — LIDOCAINE-PRILOCAINE 2.5-2.5 % EX CREA
1.0000 "application " | TOPICAL_CREAM | CUTANEOUS | Status: DC | PRN
  Filled 2014-09-03: qty 5

## 2014-09-03 MED ORDER — LEVOTHYROXINE SODIUM 25 MCG PO TABS
25.0000 ug | ORAL_TABLET | Freq: Every day | ORAL | Status: DC
  Administered 2014-09-04 – 2014-09-11 (×8): 25 ug via ORAL
  Filled 2014-09-03 (×10): qty 1

## 2014-09-03 MED ORDER — ALTEPLASE 2 MG IJ SOLR
2.0000 mg | Freq: Once | INTRAMUSCULAR | Status: DC | PRN
  Filled 2014-09-03: qty 2

## 2014-09-03 MED ORDER — SODIUM CHLORIDE 0.9 % IV SOLN: 100.0000 mL | INTRAVENOUS | Status: DC | PRN

## 2014-09-03 MED ORDER — PENTAFLUOROPROP-TETRAFLUOROETH EX AERO: 1.0000 "application " | INHALATION_SPRAY | CUTANEOUS | Status: DC | PRN

## 2014-09-03 MED ORDER — DARBEPOETIN ALFA 200 MCG/0.4ML IJ SOSY
PREFILLED_SYRINGE | INTRAMUSCULAR | Status: AC
  Administered 2014-09-03: 200 ug via INTRAVENOUS
  Filled 2014-09-03: qty 0.4

## 2014-09-03 NOTE — Progress Notes (Signed)
[name], female   DOB: 1955-05-07, 59 y.o.   MRN: [id number]     [place]      [address] [place], [place] [address]    Phone: [phone] FAX: [phone]     Subjective: Feels fine.  Tolerated fulls.  Up with therapies.  Afebrile.  WBC stable at 11.5k.   Objective:  Vital signs:  Filed Vitals:   09/03/14 0425 09/03/14 0500 09/03/14 0721 09/03/14 0756  BP: 136/72  98/55   Pulse: 101  109   Temp: 98.1 F (36.7 C)  98 F (36.7 C)   TempSrc: Oral  Oral   Resp: 23  19   Height:      Weight:  88.4 kg (194 lb 14.2 oz)    SpO2: 98%  97% 97%    Last BM Date: 09/02/14  Intake/Output   Yesterday:  06/01 0701 - 06/02 0700 In: 1975 [P.O.:600; I.V.:10; NG/GT:1265; IV Piggyback:100] Out: 500 [Urine:75; Drains:425] This shift:    I/O last 3 completed shifts: In: 3060 [P.O.:960; I.V.:20; NG/GT:1980; IV Piggyback:100] Out: 8676 [Urine:75; Drains:425; Other:2775]    Physical Exam: General: Pt awake/alert/oriented x4 in n9o acute distress Abdomen: Soft.  Nondistended. Mildly tender around wound.  Wound is c/d/i.  G tube to gravity.  J tube with TF.  No output in perc drain.  No evidence of peritonitis.  No incarcerated hernias.    Problem List:   Principal Problem:   Acute respiratory failure with hypoxia Active Problems:   Cerebral thrombosis with cerebral infarction   Hypertension   ESRD needing dialysis   Obesity (BMI 30-39.9)   Depression   Left renal mass   Chronic diastolic heart failure   P9J0 influenza   Tobacco use disorder   Renal failure (ARF), acute on chronic   Hyperlipidemia   PSVT (paroxysmal supraventricular tachycardia)   SOB (shortness of breath)   Perforated gastric ulcer   Abnormal TSH   History of intermediate V/Q scan   HIT (heparin-induced thrombocytopenia)   Fistula   Abdominal pain   Palliative care encounter   ESRD (end stage renal disease)   SVT (supraventricular  tachycardia)   Right heart failure   Duodenal anastomotic leak   Protein calorie malnutrition   Itching   Aortic aneurysm   Protein-calorie malnutrition   Sepsis   Duodenal fistula    Results:   Labs: Results for orders placed or performed during the hospital encounter of 06/17/14 (from the past 48 hour(s))  Basic metabolic panel     Status: Abnormal   Collection Time: 09/01/14  2:30 PM  Result Value Ref Range   Sodium 142 135 - 145 mmol/L   Potassium 3.7 3.5 - 5.1 mmol/L   Chloride 95 (L) 101 - 111 mmol/L   CO2 28 22 - 32 mmol/L   Glucose, Bld 104 (H) 65 - 99 mg/dL   BUN 43 (H) 6 - 20 mg/dL   Creatinine, Ser 5.72 (H) 0.44 - 1.00 mg/dL   Calcium 8.2 (L) 8.9 - 10.3 mg/dL   GFR calc non Af Amer 7 (L) >60 mL/min   GFR calc Af Amer 9 (L) >60 mL/min    Comment: (NOTE) The eGFR has been calculated using the CKD EPI equation. This calculation has not been validated in all clinical situations. eGFR's persistently <60 mL/min signify possible Chronic Kidney Disease.    Anion gap 19 (H) 5 - 15  CBC     Status:  Abnormal   Collection Time: 09/01/14  2:30 PM  Result Value Ref Range   WBC 11.1 (H) 4.0 - 10.5 K/uL   RBC 3.06 (L) 3.87 - 5.11 MIL/uL   Hemoglobin 9.0 (L) 12.0 - 15.0 g/dL   HCT 28.7 (L) 36.0 - 46.0 %   MCV 93.8 78.0 - 100.0 fL   MCH 29.4 26.0 - 34.0 pg   MCHC 31.4 30.0 - 36.0 g/dL   RDW 22.8 (H) 11.5 - 15.5 %   Platelets 252 150 - 400 K/uL  Glucose, capillary     Status: None   Collection Time: 09/01/14 11:50 PM  Result Value Ref Range   Glucose-Capillary 97 65 - 99 mg/dL  CBC     Status: Abnormal   Collection Time: 09/03/14  3:50 AM  Result Value Ref Range   WBC 11.5 (H) 4.0 - 10.5 K/uL   RBC 3.14 (L) 3.87 - 5.11 MIL/uL   Hemoglobin 9.3 (L) 12.0 - 15.0 g/dL   HCT 29.7 (L) 36.0 - 46.0 %   MCV 94.6 78.0 - 100.0 fL   MCH 29.6 26.0 - 34.0 pg   MCHC 31.3 30.0 - 36.0 g/dL   RDW 22.8 (H) 11.5 - 15.5 %   Platelets 208 150 - 400 K/uL  Basic metabolic panel      Status: Abnormal   Collection Time: 09/03/14  3:50 AM  Result Value Ref Range   Sodium 137 135 - 145 mmol/L   Potassium 4.2 3.5 - 5.1 mmol/L   Chloride 97 (L) 101 - 111 mmol/L   CO2 26 22 - 32 mmol/L   Glucose, Bld 93 65 - 99 mg/dL   BUN 37 (H) 6 - 20 mg/dL   Creatinine, Ser 4.67 (H) 0.44 - 1.00 mg/dL   Calcium 8.2 (L) 8.9 - 10.3 mg/dL   GFR calc non Af Amer 9 (L) >60 mL/min   GFR calc Af Amer 11 (L) >60 mL/min    Comment: (NOTE) The eGFR has been calculated using the CKD EPI equation. This calculation has not been validated in all clinical situations. eGFR's persistently <60 mL/min signify possible Chronic Kidney Disease.    Anion gap 14 5 - 15    Imaging / Studies: Dg Chest 2 View  09/01/2014   CLINICAL DATA:  Followup respiratory failure  EXAM: CHEST  2 VIEW  COMPARISON:  08/23/2014  FINDINGS: Moderate cardiac enlargement. Left IJ catheter is identified with tip in the SVC. There is a right-sided dialysis catheter with tips in the SVC. No pleural effusion or edema. No airspace consolidation.  IMPRESSION: 1. Lungs remain clear. 2. Catheters positioned as above.   Electronically Signed   By: Kerby Moors M.D.   On: 09/01/2014 09:04   Ir Catheter Tube Change  09/01/2014   CLINICAL DATA:  History of a duodenal fistula. Surgical drain recently fell out and request for drain replacement.  EXAM: REPLACEMENT OF PERCUTANEOUS DRAIN WITH FLUOROSCOPY  Physician: Stephan Minister. Henn, MD  FLUOROSCOPY TIME:  3 minutes, 246 mGy  MEDICATIONS AND MEDICAL HISTORY: None  ANESTHESIA/SEDATION: Moderate sedation time: None  CONTRAST:  15 mL Omnipaque-300  PROCEDURE: The procedure was explained to the patient. The risks and benefits of the procedure were discussed and the patient's questions were addressed. Informed consent was obtained from the patient. The right upper abdomen was prepped and draped in sterile fashion. Maximal barrier sterile technique was utilized including caps, mask, sterile gowns, sterile  gloves, sterile drape, hand hygiene and skin antiseptic. A 5  French catheter was advanced through the old drain site. Contrast was injected as the catheter was advanced into the tissue. Contrast injection demonstrated a connection to the duodenum bulb and stomach. Catheter and wire preferentially went into the stomach via the duodenal bulb. A series of wires were used to advance a 12 French [employer] catheter into the abdomen. The tip of the catheter was cut in order to advance the catheter over a wire. The tip was placed adjacent to the duodenal fistula but not within the bowel. Catheter was sutured to the skin. Catheter was attached to gravity bag.  FINDINGS: Focal fistula connection between the drain tract and the duodenal bulb. New 12 French catheter was placed within the fistula tract but outside of the bowel.  Estimated blood loss: Minimal  COMPLICATIONS: None  IMPRESSION: Successful replacement of the percutaneous drain. Demonstration of the duodenal bulb fistula. Drain was placed adjacent to the fistula opening but outside of the bowel.   Electronically Signed   By: Markus Daft M.D.   On: 09/01/2014 13:39    Medications / Allergies:  Scheduled Meds: . sodium chloride   Intravenous Once  . ALPRAZolam  0.5 mg Oral Q T,Th,Sa-HD  . amiodarone  400 mg Oral BID  . antiseptic oral rinse  7 mL Mouth Rinse q12n4p  . aspirin  81 mg Oral Daily  . budesonide (PULMICORT) nebulizer solution  0.25 mg Nebulization BID  . cefTAZidime (FORTAZ)  IV  2 g Intravenous Q T,Th,Sa-HD  . chlorhexidine  15 mL Mouth Rinse BID  . darbepoetin (ARANESP) injection - DIALYSIS  200 mcg Intravenous Q Thu-HD  . feeding supplement (VITAL AF 1.2 CAL)  1,000 mL Per Tube Q24H  . ipratropium  0.5 mg Nebulization TID  . levalbuterol  1.25 mg Nebulization TID  . levothyroxine  12.5 mcg Intravenous Daily  . LORazepam  1-2 mg Intravenous Q T,Th,Sa-HD  . metoprolol tartrate  12.5 mg Oral BID  . metronidazole  500 mg  Intravenous Q8H  . sodium chloride  10-40 mL Intracatheter Q12H   Continuous Infusions: . sodium chloride 10 mL/hr at 08/19/14 0950   PRN Meds:.bisacodyl, camphor-menthol, diphenhydrAMINE, diphenhydrAMINE-zinc acetate, HYDROmorphone (DILAUDID) injection, levalbuterol, ondansetron (ZOFRAN) IV, sodium chloride  Antibiotics: Anti-infectives    Start     Dose/Rate Route Frequency Ordered Stop   08/27/14 1900  cefTAZidime (FORTAZ) 2 g in dextrose 5 % 50 mL IVPB     2 g 100 mL/hr over 30 Minutes Intravenous Every T-Th-Sa (Hemodialysis) 08/27/14 1846     08/27/14 1900  metroNIDAZOLE (FLAGYL) IVPB 500 mg     500 mg 100 mL/hr over 60 Minutes Intravenous Every 8 hours 08/27/14 1847     08/21/14 1430  cefTAZidime (FORTAZ) 1 g in dextrose 5 % 50 mL IVPB     1 g 100 mL/hr over 30 Minutes Intravenous  Once 08/21/14 1426 08/21/14 1547   08/18/14 1300  metroNIDAZOLE (FLAGYL) IVPB 500 mg  Status:  Discontinued     500 mg 100 mL/hr over 60 Minutes Intravenous 3 times per day 08/18/14 1045 08/27/14 1847   08/18/14 1200  cefTAZidime (FORTAZ) 2 g in dextrose 5 % 50 mL IVPB  Status:  Discontinued     2 g 100 mL/hr over 30 Minutes Intravenous Every T-Th-Sa (Hemodialysis) 08/18/14 1054 08/27/14 1846   08/12/14 1449  ceFAZolin (ANCEF) 2-3 GM-% IVPB SOLR    Comments:  Shaaron Adler   : cabinet override      08/12/14 1449 08/12/14  1629   08/12/14 1130  ceFAZolin (ANCEF) IVPB 2 g/50 mL premix    Comments:  Hang ON CALL to xray 5/11   2 g 100 mL/hr over 30 Minutes Intravenous To Radiology 08/12/14 1035 08/12/14 1520   08/04/14 1200  vancomycin (VANCOCIN) 50 mg/mL oral solution 125 mg  Status:  Discontinued     125 mg Oral 4 times per day 08/04/14 0851 08/08/14 1229   07/18/14 1800  vancomycin (VANCOCIN) 50 mg/mL oral solution 125 mg  Status:  Discontinued     125 mg Oral 4 times per day 07/18/14 1455 08/04/14 0845   07/17/14 1830  vancomycin (VANCOCIN) IVPB 750 mg/150 ml premix     750 mg 150 mL/hr over  60 Minutes Intravenous  Once 07/17/14 1818 07/18/14 0024   07/15/14 1400  metroNIDAZOLE (FLAGYL) tablet 500 mg  Status:  Discontinued     500 mg Oral 3 times per day 07/15/14 1202 07/18/14 1533   07/13/14 2200  piperacillin-tazobactam (ZOSYN) IVPB 2.25 g  Status:  Discontinued     2.25 g 100 mL/hr over 30 Minutes Intravenous 3 times per day 07/13/14 1318 07/24/14 1138   07/13/14 2000  fluconazole (DIFLUCAN) IVPB 200 mg  Status:  Discontinued     200 mg 100 mL/hr over 60 Minutes Intravenous Every 24 hours 07/13/14 1318 07/19/14 1027   07/07/14 1200  vancomycin (VANCOCIN) IVPB 1000 mg/200 mL premix  Status:  Discontinued     1,000 mg 200 mL/hr over 60 Minutes Intravenous Every 24 hours 07/07/14 0937 07/14/14 1712   07/06/14 2000  fluconazole (DIFLUCAN) IVPB 400 mg  Status:  Discontinued     400 mg 100 mL/hr over 120 Minutes Intravenous Every 24 hours 07/06/14 1507 07/13/14 1318   07/06/14 1800  piperacillin-tazobactam (ZOSYN) IVPB 2.25 g  Status:  Discontinued     2.25 g 100 mL/hr over 30 Minutes Intravenous 4 times per day 07/06/14 1505 07/13/14 1318   07/05/14 2000  fluconazole (DIFLUCAN) IVPB 200 mg  Status:  Discontinued     200 mg 100 mL/hr over 60 Minutes Intravenous Every 24 hours 07/05/14 0757 07/06/14 1507   07/05/14 1400  piperacillin-tazobactam (ZOSYN) IVPB 2.25 g  Status:  Discontinued     2.25 g 100 mL/hr over 30 Minutes Intravenous 3 times per day 07/05/14 0749 07/06/14 1505   07/02/14 1800  vancomycin (VANCOCIN) IVPB 1000 mg/200 mL premix  Status:  Discontinued     1,000 mg 200 mL/hr over 60 Minutes Intravenous Every 24 hours 07/01/14 1858 07/05/14 0801   07/01/14 2200  piperacillin-tazobactam (ZOSYN) IVPB 3.375 g  Status:  Discontinued     3.375 g 100 mL/hr over 30 Minutes Intravenous 4 times per day 07/01/14 1858 07/05/14 0749   07/01/14 2000  fluconazole (DIFLUCAN) IVPB 400 mg  Status:  Discontinued     400 mg 100 mL/hr over 120 Minutes Intravenous Every 24 hours  07/01/14 1858 07/05/14 0757   07/01/14 1400  fluconazole (DIFLUCAN) IVPB 200 mg  Status:  Discontinued     200 mg 100 mL/hr over 60 Minutes Intravenous Every 24 hours 07/01/14 1330 07/01/14 1853   07/01/14 1000  piperacillin-tazobactam (ZOSYN) IVPB 2.25 g  Status:  Discontinued     2.25 g 100 mL/hr over 30 Minutes Intravenous Every 8 hours 07/01/14 0952 07/01/14 1853   07/01/14 1000  vancomycin (VANCOCIN) 500 mg in sodium chloride 0.9 % 100 mL IVPB     500 mg 100 mL/hr over 60 Minutes Intravenous  Once  07/01/14 0952 07/01/14 1126   06/30/14 1200  vancomycin (VANCOCIN) IVPB 1000 mg/200 mL premix     1,000 mg 200 mL/hr over 60 Minutes Intravenous  Once 06/30/14 0944 06/30/14 1403   06/30/14 1200  ceFEPIme (MAXIPIME) 2 g in dextrose 5 % 50 mL IVPB     2 g 100 mL/hr over 30 Minutes Intravenous  Once 06/30/14 0944 06/30/14 1425   06/30/14 0100  vancomycin (VANCOCIN) 2,000 mg in sodium chloride 0.9 % 500 mL IVPB     2,000 mg 250 mL/hr over 120 Minutes Intravenous  Once 06/30/14 0048 06/30/14 0525   06/30/14 0100  ceFEPIme (MAXIPIME) 2 g in dextrose 5 % 50 mL IVPB     2 g 100 mL/hr over 30 Minutes Intravenous  Once 06/30/14 0048 06/30/14 0322   06/27/14 0600  cefUROXime (ZINACEF) 1.5 g in dextrose 5 % 50 mL IVPB     1.5 g 100 mL/hr over 30 Minutes Intravenous On call to O.R. 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

## 2014-09-03 NOTE — Progress Notes (Signed)
Pt refusing to wear CPAP at this time. RT will monitor.

## 2014-09-03 NOTE — Progress Notes (Signed)
PROGRESS NOTE  [name] ZOX:[id number] DOB: 22-Jun-1955 DOA: 06/17/2014 PCP: [name], MD  Brief Narrative: 59 yo ? smoker with hx HTN, COPD, CKD IV followed @ [facility]-[name] Page, failed L AV fistula, initially admitted 3/16 with flu and acute hypoxic resp failure. Ultimately tx to [facility] due to need for HD. L sided weakness 3/18 and found to have R ACA stroke-showed acute infarct R ACA (3/19). Patient more lethargic 3/30 and CT abd=perf viscus 3/30. She was taken to the OR for ex lap for perforated pyloric ulcer with diffuse peritonitis, ischemic necrosis of mid ileum. Patient developed septic shock on 4-1, she was started on pressors and wean off pressors on 4-3. Subsequently, she was diagnosed with pelvic abscess by CT scan perform on 4-7. She underwent pelvic drain placement by IR on 4/8= culture neg 08/15/14 another drained placed in the pelvic cul-de-sac abscess with culture = pseudomonas. On 08/20/14, Gluteal drain was inadvertently pulled out. Repeat CT scan shows improvement in the fluid collection so decision was to leave it out and continue IV abx. Pt hospital stay also complicated by high output duodenal (enterocutaneous fistula) fistula. As a result, pt was made npo and started on TPN. Now stable, on TNA +high output from fistula/leak per [facility]. Had a G_J tube placed by Dr. [name] of IR and Gen surgery has continued to assist with management of her multiple surgical comorbidities C. diff diagnosed 4-12-initially unresponsive to flagyl, and she was started on PO vancomycin 4-16 which she finished 3 week.  5/27--TPN weaned off and increasing TF.  06/24/14 Develops run of SVT, atrial tachycardia. Cardiology was helping with management. Course complicated with high RV pressure and Cor Pulmonale; 4/27 CT angio negative for PE. On 5/2 she developed hypoxemia, hypotension and SVT during dialysis treatment. Dialysis was stopped. She received IV bolus. She was started on BIPAP.  She was transferred to Step down unit. PCCM and cardiology re consulted. Her Hr decreased, BP improved. She was taken off BIPAP. This recurred on 5/15 with episodic SVT. Since then she has had intermitten short episodes of SVT, but on 08/28/14 she has had more sustained SVT, therefore; cardiology was reconsulted on on 08/29/14.  Assessment/Plan: Status post exploratory laparotomy with closure of perforated pyloric ulcer/abdominal abscess / small bowel resection w/ postop ileus and now with enterocutaneous fistula. Pelvic abscess s/p percutaneous drain placement  -completed, long courses of IV  Abx including Vanc and FLuconazole -Due to Fistula, she was made NPO, TPN was started -Fistula study done shows JP drain connection to the duodenal bulb-5-02. - 08/15/2014 repeat CT abdomen with 3.0 x 3.5 x 5.7 cm collection in the pelvic cul-de-sac and extraluminal air alongside JP drain in anterior abd wall -had high output from Fistula/leak. Had G_J placed by IR 08/12/14 at request of gen surgery -CT repeat shows 5 cm post cul-de-sac abscess which was drained on 5/16-culture revealed pseudomonas aeruginosa -ID was consulted as patient had Pseudomonas growing from her culture wounds and they started back Flagyl as well as Elita Quick on 5/16  -Per ID recommendation--plan D#17/21 daysFortaz and D#17/ 28 days metronidazole -Patient inadvertently pulled out her gluteal/cul-de-sac drain May 19. Seen by interventional radiology and they recommended repeat CT scan, which was done. Shows that the fluid collection has decreased in size. They do not plan to replace the drain at this time. Continue with antibiotics alone.  -08/21/2014 CT scan did suggest a transmural defect in the stomach wall  near the patient's pyloric Cheree Ditto patch. -Patient accidentally pulled out her JP drain on 5/22. This has been replaced by surgery on 5/23. -Octreotide was discontinued 5/23--enterocutaneous fistula output appears to be decreasing  gradually -finished TPN on 08/27/14 -Enteral Feeding was increased 5/27 to 55 mL per hour--tolerating without residuals -08/29/14--started clear liquids-->tolerating well -08/30/14--increased WBC-->check Cdiff--neg -08/31/14--pt inadvertently pulled out R-side JP drain again (3rd time) during therapy + GJ tube with bloody drainage--contacted general surgery 09/01/14--IR replaced RUQ drain (entercutaneous fistula)--reminded pt to wear abd binder at all times; no output from this drain 6/1: started full liquids   PSVT --Atrial tachycardia -Felt to be secondary to fluid metabolic shifts from ESRD and ongoing abd issues; alos has severe anxiety with dialysis as well as COPD/Cor Pulmonale  -was on IV metoprolol and amiodarone, now 5/31 switched to PO -V-Q scan with intermediate probability for pulmonary embolism.  -CTA 4/27, negative for PE  -Cards following  ESRD new, now on HD this admit complicated by intermittent hypotension AKI on CKD 4, due to sepsis, shock Ongoing hemodialysis per Nephrology - perm-cath placed 3/26 d/t AVF problems Patient AV fistula reviewed and not mature yet per Dr. Paulene Floor need to revisit prior to d/c -TPN weaned off 08/27/14 -Continue hemodialysis Tuesday, Thursday, Saturday while the patient is in the hospital -Xanax with each HD due to significant anxiety with HD -starting to sit up a little with HD now  C. difficile colitis still on contact precautions -C diff positive on 4-12, Received 4 days of flagyl without improvement, then started on PO vanc -Completed 3 weeks of Oral Vanc-stopped 5/7, this was 1 week after IV Zosyn was stopped 5/5 -5/28--having loose stools again-->Cdiff PCR negative -keep on contact precautions  ACA CVA with Lt sided weakness -06/23/14--TEE ;normal LV function; no LAA thrombus -Started on ASA 81mg  per Neuro; resumed this -Will need ongoing long term rehab - PT/OT to cont to follow while inpatient -Patient remains very weak and  deconditioned. 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 Fluids/volume managed with hemodialysis.  Elevated PA pressures on 2-D echo; CTA negative for PE - 07/23/2014 echo-- EF 60-61%, grade 1 diastolic dysfunction, Severe RV dilatation, PAP 90  Small PFO Has been evaluated by Cardiology. No intervention planned currently.  Anemia of critical illness/chronic disease -and bleeding from leak/fistula which has resolved. -s/p 2U PRBC 4/13 and 5/10 and 5/26 -Aranesp/iron per renal -transfuse for hemoglobin less than 7  Acute hypoxic Respiratory Failure:  -This was secondary to H1N1, hypervolemia, HCAP >> resolved.  -also has COPD and Cor Pulmonale - now on scheduled Xopenex and Atrovent, change to PRN tomorrow - presently stable on 2 L-3L  Thrombocytopenia; resolved.  Appears to be associated with sepsis Has had mild positive HIT test during this admission   Septic shock  -due to perforated prepyloric ulcer, required pressors -resolved currently  Diabetes mellitus 2 Did have episodes of Hypoglycemia in setting of NPO status on TPN now -Hba1c 6.5 -CBGs stable now  Abnormal TSH -Free T4 WNL, T3 1.8 -most likely sick thyroid with ongoing infection and body stress; also due to amiodarone use. TSH in 7 range -continue low dose synthroid given PSVT, change to PO synthroid  L Renal mass  -noted on [place], Indeterminate 1.9 cm hypoechoic mass off the interpolar region of the left kidney -may potentially represent a cyst however solid mass is not excluded -needs outpatient FU for  this  Fall on May 18  DVT prophylaxis: SCDs Code Status: FULL Family Communication: Discussed with patient. No family at bedside. Disposition Plan: Have been unable to transfer patient out of stepdown unit due to dyspnea and tachycardia. She also has complex wound care needs. Dialysis  is ongoing. Not ready for discharge. Not accepted to [facility]  Consultants: Cardiology Nephrology PCCM Gen Surgery  Palliative care    Procedures/Studies: Ct Abdomen Pelvis Wo Contrast  08/22/2014   CLINICAL DATA:  Abdominal abscess. No intravenous contrast due to renal failure.  EXAM: CT ABDOMEN AND PELVIS WITHOUT CONTRAST  TECHNIQUE: Multidetector CT imaging of the abdomen and pelvis was performed following the standard protocol without IV contrast.  COMPARISON:  08/15/2014  FINDINGS: BODY WALL: Open laparotomy without fluid collection.  LOWER CHEST: Chronic triangular opacity in the posterior costophrenic sulcus on the left, atelectasis versus scarring  ABDOMEN/PELVIS:  Liver: No focal abnormality.  Biliary: High-density material within the gallbladder showing mild wall thickening which is chronic.  Pancreas: Unremarkable.  Spleen: Unremarkable.  Adrenals: Unremarkable.  Kidneys and ureters: No hydronephrosis or stone. Presumed 1 cm cyst from the interpolar left kidney  Bladder: Decompressed.  No pathologic findings.  Reproductive: Calcified and noncalcified uterine fibroids, better seen on previous enhanced imaging.  Bowel: Right upper quadrant drain related to perforated pyloric ulcer status post Cheree Ditto patch. Specially visible on coronal imaging, there is a gas channel through the pylorus measuring 6 to 9 mm in diameter. No bowel obstruction. Enteroenterostomy noted in the pelvis. Oral contrast again seen in the colon. No notable colonic wall thickening in this patient with history of C difficile. Negative appendix. Percutaneous gastrojejunostomy tube is in good position.  Retroperitoneum: No mass or adenopathy.  Peritoneum: Known thick walled collection in the rectovaginal recess smaller than 08/15/2014 at 40 x 16 mm as compared to 51 x 32 mm. No new collection is identified. No free pneumoperitoneum.  Vascular: 3 cm fusiform infrarenal aortic aneurysm.  OSSEOUS: No acute abnormalities.   IMPRESSION: 1. Near the patient's pyloric Cheree Ditto patch is a transmural defect in the stomach wall, contiguous with the surgical drain. If repeat surgery is considered, oral contrast could confirm an open ulcer. 2. Decreased rectovaginal recess fluid collection, now 4 x 1.5 cm (previously 5 x 3 cm).   Electronically Signed   By: Marnee Spring M.D.   On: 08/22/2014 01:49   Ct Abdomen Pelvis Wo Contrast  08/15/2014   CLINICAL DATA:  Intra abdominal abscess.  EXAM: CT ABDOMEN AND PELVIS WITHOUT CONTRAST  TECHNIQUE: Multidetector CT imaging of the abdomen and pelvis was performed following the standard protocol without IV contrast.  COMPARISON:  CT scan of Aug 03, 2014.  FINDINGS: Severe degenerative disc disease is noted at L5-S1. Minimal subsegmental atelectasis is noted posteriorly in the left lung base.  No gallstones are noted. No focal abnormality is noted in the liver, spleen or pancreas on these unenhanced images. Adrenal glands appear normal. Stable exophytic cyst is seen arising from midpole of left kidney. No hydronephrosis or renal obstruction is noted. No renal or ureteral calculi are noted. 3 cm infrarenal abdominal aortic aneurysm is noted. Gastrojejunostomy tube is seen entering stomach percutaneously and with distal tip in the jejunum. JP surgical drain is seen entering right upper quadrant of abdomen underneath the liver with distal tip in the left upper quadrant. No significant fluid collection is noted around the drain. Fluid collection is again identified and posterior cul-de-sac of pelvis measuring 5.1 x 3.2 cm. Calcified  degenerating fibroid is noted in the uterus. Urinary bladder is unremarkable. There is no evidence of bowel obstruction. The appendix appears normal. No significant adenopathy is noted. Large midline surgical anterior abdominal wall incision is noted.  IMPRESSION: Stable 3 cm infrarenal abdominal aortic aneurysm.  No change in position of JP surgical drain with distal tip in  the left upper quadrant of the abdomen.  Interval placement of percutaneous gastrojejunostomy tube with distal tip in the jejunum.  Grossly stable size and appearance of 5 cm fluid collection seen in posterior cul-de-sac of the pelvis.  Continued presence uterine fibroids.   Electronically Signed   By: Lupita Raider, M.D.   On: 08/15/2014 15:15   Dg Chest 1 View  08/13/2014   CLINICAL DATA:  Shortness of breath  EXAM: CHEST  1 VIEW  COMPARISON:  08/03/2014  FINDINGS: Moderately severe cardiac enlargement. Central line in unchanged position on the right and left side. Mild vascular congestion with no evidence of edema effusion or consolidation.  IMPRESSION: No acute findings   Electronically Signed   By: Esperanza Heir M.D.   On: 08/13/2014 11:04   Dg Chest 2 View  09/01/2014   CLINICAL DATA:  Followup respiratory failure  EXAM: CHEST  2 VIEW  COMPARISON:  08/23/2014  FINDINGS: Moderate cardiac enlargement. Left IJ catheter is identified with tip in the SVC. There is a right-sided dialysis catheter with tips in the SVC. No pleural effusion or edema. No airspace consolidation.  IMPRESSION: 1. Lungs remain clear. 2. Catheters positioned as above.   Electronically Signed   By: Signa Kell M.D.   On: 09/01/2014 09:04   Ir Gastrostomy Tube Mod Sed  08/12/2014   CLINICAL DATA:  Perforated duodenal ulcer, post g patch with persistent leak. Percutaneous gastrojejunostomy as requested for gastric decompression and small bowel enteral feeding support.  EXAM: PERC PLACEMENT GASTROSTOMY;  CONVERT G-TUBE TO G-JTUBE  FLUOROSCOPY TIME:  16 minutes 24 seconds, 1016.6 mGy  TECHNIQUE: The procedure, risks, benefits, and alternatives were explained to the patient. Questions regarding the procedure were encouraged and answered. The patient understands and consents to the procedure. As antibiotic prophylaxis, cefazolin 2 g was ordered pre-procedure and administered intravenously within one hour of incision. . A 5 French  angiographic catheter was placed as orogastric tube. The upper abdomen was prepped with Betadine, draped in usual sterile fashion, and infiltrated locally with 1% lidocaine. 1 mg glucagon was administered intravenously to facilitate gastric distention and slow peristalsis. Stomach was insufflated using air through the orogastric tube. An 74 French sheath needle was advanced percutaneously into the gastric lumen under fluoroscopy. Gas could be aspirated and a small contrast injection confirmed intraluminal spread. The sheath was exchanged over a guidewire for a 9 [place] vascular sheath, through which the snare device was advanced and used to snare a guidewire passed through the orogastric tube. This was withdrawn, and the snare attached to the 20 French pull-through gastrostomy tube, which was advanced antegrade, positioned with the internal bumper securing the anterior gastric wall to the anterior abdominal wall. Small contrast injection confirms appropriate positioning. The external bumper was applied and the catheter was flushed.  A 5 French angiographic catheter was placed coaxially through the gastrostomy tube and used to direct an angled stiff glidewire across the pylorus, beyond the proximal duodenal perforation, and into the proximal jejunum just beyond the ligament of Treitz. Over this, a coaxial jejunostomy feeding adapter was advanced. Contrast injection confirmed patency and appropriate position of the gastric  and jejunal lumens. No extravasation. The lumens were flushed with saline and capped. The patient tolerated the procedure well.  COMPLICATIONS: COMPLICATIONS none  IMPRESSION: 1. Technically successful 20 French pull-through gastrostomy placement under fluoroscopy. 2. Technically successful coaxial jejunostomy feeding adapter placement to the ligament of Treitz.   Electronically Signed   By: Corlis Leak  Hassell M.D.   On: 08/12/2014 16:12   Ir Catheter Tube Change  09/01/2014   CLINICAL DATA:  History of  a duodenal fistula. Surgical drain recently fell out and request for drain replacement.  EXAM: REPLACEMENT OF PERCUTANEOUS DRAIN WITH FLUOROSCOPY  Physician: Rachelle HoraAdam R. Henn, MD  FLUOROSCOPY TIME:  3 minutes, 246 mGy  MEDICATIONS AND MEDICAL HISTORY: None  ANESTHESIA/SEDATION: Moderate sedation time: None  CONTRAST:  15 mL Omnipaque-300  PROCEDURE: The procedure was explained to the patient. The risks and benefits of the procedure were discussed and the patient's questions were addressed. Informed consent was obtained from the patient. The right upper abdomen was prepped and draped in sterile fashion. Maximal barrier sterile technique was utilized including caps, mask, sterile gowns, sterile gloves, sterile drape, hand hygiene and skin antiseptic. A 5 French catheter was advanced through the old drain site. Contrast was injected as the catheter was advanced into the tissue. Contrast injection demonstrated a connection to the duodenum bulb and stomach. Catheter and wire preferentially went into the stomach via the duodenal bulb. A series of wires were used to advance a 12 French [employer] catheter into the abdomen. The tip of the catheter was cut in order to advance the catheter over a wire. The tip was placed adjacent to the duodenal fistula but not within the bowel. Catheter was sutured to the skin. Catheter was attached to gravity bag.  FINDINGS: Focal fistula connection between the drain tract and the duodenal bulb. New 12 French catheter was placed within the fistula tract but outside of the bowel.  Estimated blood loss: Minimal  COMPLICATIONS: None  IMPRESSION: Successful replacement of the percutaneous drain. Demonstration of the duodenal bulb fistula. Drain was placed adjacent to the fistula opening but outside of the bowel.   Electronically Signed   By: Richarda OverlieAdam  Henn M.D.   On: 09/01/2014 13:39   Ir Adele SchilderGastr Tamsen Sniderube Convert Gastr-jej Per W/fl Mod Sed  08/12/2014   CLINICAL DATA:  Perforated duodenal ulcer,  post g patch with persistent leak. Percutaneous gastrojejunostomy as requested for gastric decompression and small bowel enteral feeding support.  EXAM: PERC PLACEMENT GASTROSTOMY;  CONVERT G-TUBE TO G-JTUBE  FLUOROSCOPY TIME:  16 minutes 24 seconds, 1016.6 mGy  TECHNIQUE: The procedure, risks, benefits, and alternatives were explained to the patient. Questions regarding the procedure were encouraged and answered. The patient understands and consents to the procedure. As antibiotic prophylaxis, cefazolin 2 g was ordered pre-procedure and administered intravenously within one hour of incision. . A 5 French angiographic catheter was placed as orogastric tube. The upper abdomen was prepped with Betadine, draped in usual sterile fashion, and infiltrated locally with 1% lidocaine. 1 mg glucagon was administered intravenously to facilitate gastric distention and slow peristalsis. Stomach was insufflated using air through the orogastric tube. An 5318 French sheath needle was advanced percutaneously into the gastric lumen under fluoroscopy. Gas could be aspirated and a small contrast injection confirmed intraluminal spread. The sheath was exchanged over a guidewire for a 9 [place] vascular sheath, through which the snare device was advanced and used to snare a guidewire passed through the orogastric tube. This was withdrawn, and the snare  attached to the 20 [place] pull-through gastrostomy tube, which was advanced antegrade, positioned with the internal bumper securing the anterior gastric wall to the anterior abdominal wall. Small contrast injection confirms appropriate positioning. The external bumper was applied and the catheter was flushed.  A 5 French angiographic catheter was placed coaxially through the gastrostomy tube and used to direct an angled stiff glidewire across the pylorus, beyond the proximal duodenal perforation, and into the proximal jejunum just beyond the ligament of Treitz. Over this, a coaxial  jejunostomy feeding adapter was advanced. Contrast injection confirmed patency and appropriate position of the gastric and jejunal lumens. No extravasation. The lumens were flushed with saline and capped. The patient tolerated the procedure well.  COMPLICATIONS: COMPLICATIONS none  IMPRESSION: 1. Technically successful 20 French pull-through gastrostomy placement under fluoroscopy. 2. Technically successful coaxial jejunostomy feeding adapter placement to the ligament of Treitz.   Electronically Signed   By: Corlis Leak M.D.   On: 08/12/2014 16:12   Dg Chest Port 1 View  08/23/2014   CLINICAL DATA:  Left chest pain  EXAM: PORTABLE CHEST - 1 VIEW  COMPARISON:  08/18/2014  FINDINGS: Patient is rotated.  Lungs are essentially clear. No focal consolidation. No pleural effusion or pneumothorax.  Cardiomegaly.  Left IJ venous catheter terminates at the cavoatrial junction. Right IJ dual lumen dialysis catheter terminates in the lower SVC.  IMPRESSION: No evidence of acute cardiopulmonary disease.   Electronically Signed   By: Charline Bills M.D.   On: 08/23/2014 19:17   Dg Chest Port 1 View  08/18/2014   CLINICAL DATA:  Shortness of breath at rest.  Having hemodialysis.  EXAM: PORTABLE CHEST - 1 VIEW  COMPARISON:  08/15/2014.  FINDINGS: The cardiac silhouette remains mildly enlarged. Stable right jugular double-lumen catheter. Clear lungs. The pulmonary vasculature remains prominent. No pleural fluid. Minimal right shoulder degenerative changes.  IMPRESSION: Stable cardiomegaly and pulmonary vascular congestion.   Electronically Signed   By: Beckie Salts M.D.   On: 08/18/2014 09:47   Dg Chest Port 1 View  08/15/2014   CLINICAL DATA:  Shortness of breath.  EXAM: PORTABLE CHEST - 1 VIEW  COMPARISON:  08/13/2014 and multiple prior chest radiographs  FINDINGS: Cardiomegaly and pulmonary vascular congestion again noted.  A right IJ central venous catheter is noted with tips overlying the upper and mid SVC.  A left  central venous catheter is present with tip overlying the lower SVC.  There is no evidence of focal airspace disease, pulmonary edema, suspicious pulmonary nodule/mass, pleural effusion, or pneumothorax. No acute bony abnormalities are identified.  IMPRESSION: Cardiomegaly with pulmonary vascular congestion.   Electronically Signed   By: Harmon Pier M.D.   On: 08/15/2014 17:28   Dg Abd Portable 1v  08/23/2014   CLINICAL DATA:  JP drain, broken, initial encounter  EXAM: PORTABLE ABDOMEN - 1 VIEW  COMPARISON:  CT abdomen pelvis dated 08/21/2014  FINDINGS: A portion of the right flank is excluded from this single image.  The patient's right abdominal surgical drain is not visualized.  Gastrojejunostomy in satisfactory position.  IMPRESSION: The patient's right abdominal surgical drain is not visualized.  Gastrojejunostomy in satisfactory position.   Electronically Signed   By: Charline Bills M.D.   On: 08/23/2014 19:11   Ct Image Guided Fluid Drain By Catheter  08/17/2014   CLINICAL DATA:  Postoperative intra-abdominal abscess, subsequent encounter. Small pelvic fluid collection.  EXAM: CT-GUIDED DRAIN PLACEMENT IN PELVIC FLUID COLLECTION  Physician: Rachelle Hora. Lowella Dandy, MD  FLUOROSCOPY TIME:  None  MEDICATIONS: 50 mcg fentanyl  ANESTHESIA/SEDATION: Patient was monitored by a radiology nurse during the procedure.  PROCEDURE: Informed consent was obtained for drain placement. Patient was placed on her left side. Images through the pelvis were obtained. The right buttock was prepped and draped in sterile fashion. 1% lidocaine was used for a local anesthetic. An 18 gauge needle was directed into the pelvic fluid collection with CT guidance from a transgluteal approach. Thick yellow purulent fluid was aspirated. A stiff Amplatz wire was placed. The tract was dilated to accommodate a 10.2 [place] multipurpose drain. It was technically difficult to advance the drain into the collection due to the small size of the  collection and the patient's body habitus. Eventually, the drain was successfully placed within the collection. 5 mL of yellow purulent fluid was obtained. Catheter was sutured to the skin and attached to a suction bulb. Fluid was sent for culture.  FINDINGS: Small fluid collection just posterior to the uterus. A total of 5 mL of yellow purulent fluid was removed.  Estimated blood loss: Minimal  COMPLICATIONS: None  IMPRESSION: CT-guided placement of a drainage catheter in the small pelvic fluid collection.   Electronically Signed   By: Richarda Overlie M.D.   On: 08/17/2014 17:21        Subjective: Still reports some abd pain, tolerating liquids , in better spirits today  Objective: Filed Vitals:   09/03/14 0756 09/03/14 1153 09/03/14 1200 09/03/14 1418  BP:  92/51 95/59   Pulse:  102 109   Temp:  97.9 F (36.6 C)    TempSrc:  Oral    Resp:  29 32   Height:      Weight:      SpO2: 97% 96% 0% 100%    Intake/Output Summary (Last 24 hours) at 09/03/14 1516 Last data filed at 09/03/14 1400  Gross per 24 hour  Intake   1645 ml  Output    500 ml  Net   1145 ml   Weight change: -8.6 kg (-18 lb 15.4 oz) Exam:   General:  Pt is alert, follows commands appropriately, not in acute distress, chronically ill appearing, seen sitting in recliner  HEENT: No icterus, No thrush, no meningismus [place]/AT  Cardiovascular: RRR, S1/S2, no rubs, no gallops  Respiratory: Bibasilar expiratory wheeze. Good air movement.  Abdomen: Soft/+BS, non tender, non distended, no guarding, 2drains noted, L sided drain with mod amount of greenish output and R sided drain without any output  Extremities: trace LE edema, No lymphangitis, No petechiae, No rashes, no synovitis  Data Reviewed: Basic Metabolic Panel:  Recent Labs Lab 08/29/14 0520 09/01/14 1430 09/03/14 0350  NA 140 142 137  K 3.5 3.7 4.2  CL 102 95* 97*  CO2 28 28 26   GLUCOSE 104* 104* 93  BUN 32* 43* 37*  CREATININE 4.48* 5.72* 4.67*    CALCIUM 8.3* 8.2* 8.2*  PHOS 4.4  --   --    Liver Function Tests:  Recent Labs Lab 08/29/14 0520  ALBUMIN 1.9*   No results for input(s): LIPASE, AMYLASE in the last 168 hours. No results for input(s): AMMONIA in the last 168 hours. CBC:  Recent Labs Lab 08/29/14 0520 08/29/14 2008 08/31/14 0600 09/01/14 1430 09/03/14 0350  WBC 14.3* 20.5* 11.3* 11.1* 11.5*  HGB 9.1* 9.9* 9.1* 9.0* 9.3*  HCT 29.3* 31.0* 29.7* 28.7* 29.7*  MCV 94.8 93.9 95.8 93.8 94.6  PLT 303 229 232 252 208   Cardiac  Enzymes: No results for input(s): CKTOTAL, CKMB, CKMBINDEX, TROPONINI in the last 168 hours. BNP: Invalid input(s): POCBNP CBG:  Recent Labs Lab 08/27/14 1817 09/01/14 2350  GLUCAP 157* 97    Recent Results (from the past 240 hour(s))  Clostridium Difficile by PCR     Status: None   Collection Time: 08/31/14  2:35 PM  Result Value Ref Range Status   C difficile by pcr NEGATIVE NEGATIVE Final     Scheduled Meds: . sodium chloride   Intravenous Once  . ALPRAZolam  0.5 mg Oral Q T,Th,Sa-HD  . amiodarone  400 mg Oral BID  . antiseptic oral rinse  7 mL Mouth Rinse q12n4p  . aspirin  81 mg Oral Daily  . budesonide (PULMICORT) nebulizer solution  0.25 mg Nebulization BID  . cefTAZidime (FORTAZ)  IV  2 g Intravenous Q T,Th,Sa-HD  . chlorhexidine  15 mL Mouth Rinse BID  . darbepoetin (ARANESP) injection - DIALYSIS  200 mcg Intravenous Q Thu-HD  . feeding supplement (VITAL AF 1.2 CAL)  1,000 mL Per Tube Q24H  . ipratropium  0.5 mg Nebulization TID  . levalbuterol  1.25 mg Nebulization TID  . levothyroxine  12.5 mcg Intravenous Daily  . LORazepam  1-2 mg Intravenous Q T,Th,Sa-HD  . metoprolol tartrate  12.5 mg Oral BID  . metronidazole  500 mg Intravenous Q8H  . sodium chloride  10-40 mL Intracatheter Q12H   Continuous Infusions: . sodium chloride 10 mL/hr at 08/19/14 0950     Zannie Cove, MD  [facility] Pager [phone]  If 7PM-7AM, please contact  night-coverage [email or web address] Password [email or web address] 09/03/2014, 3:16 PM   LOS: 78 days

## 2014-09-03 NOTE — Progress Notes (Signed)
TELEMETRY: Reviewed telemetry pt in sinus tachycardia rate 107, some episodes of paroxysmal atrial tachycardia with rates ion 120s. Patient is not aware.: Filed Vitals:   09/03/14 0425 09/03/14 0500 09/03/14 0721 09/03/14 0756  BP: 136/72  98/55   Pulse: 101  109   Temp: 98.1 F (36.7 C)  98 F (36.7 C)   TempSrc: Oral  Oral   Resp: 23  19   Height:      Weight:  88.4 kg (194 lb 14.2 oz)    SpO2: 98%  97% 97%    Intake/Output Summary (Last 24 hours) at 09/03/14 1017 Last data filed at 09/03/14 0800  Gross per 24 hour  Intake   1700 ml  Output    500 ml  Net   1200 ml   Filed Weights   09/01/14 2035 09/02/14 0413 09/03/14 0500  Weight: 96.2 kg (212 lb 1.3 oz) 96.5 kg (212 lb 11.9 oz) 88.4 kg (194 lb 14.2 oz)    Subjective  No chest pain or palpitations. For dialysis today.  . sodium chloride   Intravenous Once  . ALPRAZolam  0.5 mg Oral Q T,Th,Sa-HD  . amiodarone  400 mg Oral BID  . antiseptic oral rinse  7 mL Mouth Rinse q12n4p  . aspirin  81 mg Oral Daily  . budesonide (PULMICORT) nebulizer solution  0.25 mg Nebulization BID  . cefTAZidime (FORTAZ)  IV  2 g Intravenous Q T,Th,Sa-HD  . chlorhexidine  15 mL Mouth Rinse BID  . darbepoetin (ARANESP) injection - DIALYSIS  200 mcg Intravenous Q Thu-HD  . feeding supplement (VITAL AF 1.2 CAL)  1,000 mL Per Tube Q24H  . ipratropium  0.5 mg Nebulization TID  . levalbuterol  1.25 mg Nebulization TID  . levothyroxine  12.5 mcg Intravenous Daily  . LORazepam  1-2 mg Intravenous Q T,Th,Sa-HD  . metoprolol tartrate  12.5 mg Oral BID  . metronidazole  500 mg Intravenous Q8H  . sodium chloride  10-40 mL Intracatheter Q12H   . sodium chloride 10 mL/hr at 08/19/14 0950    LABS: Basic Metabolic Panel:  Recent Labs  16/01/9604/31/16 1430 09/03/14 0350  NA 142 137  K 3.7 4.2  CL 95* 97*  CO2 28 26  GLUCOSE 104* 93  BUN 43* 37*  CREATININE 5.72* 4.67*  CALCIUM 8.2* 8.2*   Liver Function Tests: No results for input(s): AST,  ALT, ALKPHOS, BILITOT, PROT, ALBUMIN in the last 72 hours. No results for input(s): LIPASE, AMYLASE in the last 72 hours. CBC:  Recent Labs  09/01/14 1430 09/03/14 0350  WBC 11.1* 11.5*  HGB 9.0* 9.3*  HCT 28.7* 29.7*  MCV 93.8 94.6  PLT 252 208   Cardiac Enzymes: No results for input(s): CKTOTAL, CKMB, CKMBINDEX, TROPONINI in the last 72 hours. BNP: No results for input(s): PROBNP in the last 72 hours. D-Dimer: No results for input(s): DDIMER in the last 72 hours. Hemoglobin A1C: No results for input(s): HGBA1C in the last 72 hours. Fasting Lipid Panel: No results for input(s): CHOL, HDL, LDLCALC, TRIG, CHOLHDL, LDLDIRECT in the last 72 hours. Thyroid Function Tests: No results for input(s): TSH, T4TOTAL, T3FREE, THYROIDAB in the last 72 hours.  Invalid input(s): FREET3   Radiology/Studies:  Ir Catheter Tube Change  09/01/2014   CLINICAL DATA:  History of a duodenal fistula. Surgical drain recently fell out and request for drain replacement.  EXAM: REPLACEMENT OF PERCUTANEOUS DRAIN WITH FLUOROSCOPY  Physician: Rachelle HoraAdam R. Lowella DandyHenn, MD  FLUOROSCOPY TIME:  3 minutes,  246 mGy  MEDICATIONS AND MEDICAL HISTORY: None  ANESTHESIA/SEDATION: Moderate sedation time: None  CONTRAST:  15 mL Omnipaque-300  PROCEDURE: The procedure was explained to the patient. The risks and benefits of the procedure were discussed and the patient's questions were addressed. Informed consent was obtained from the patient. The right upper abdomen was prepped and draped in sterile fashion. Maximal barrier sterile technique was utilized including caps, mask, sterile gowns, sterile gloves, sterile drape, hand hygiene and skin antiseptic. A 5 French catheter was advanced through the old drain site. Contrast was injected as the catheter was advanced into the tissue. Contrast injection demonstrated a connection to the duodenum bulb and stomach. Catheter and wire preferentially went into the stomach via the duodenal bulb. A  series of wires were used to advance a 12 French [employer] catheter into the abdomen. The tip of the catheter was cut in order to advance the catheter over a wire. The tip was placed adjacent to the duodenal fistula but not within the bowel. Catheter was sutured to the skin. Catheter was attached to gravity bag.  FINDINGS: Focal fistula connection between the drain tract and the duodenal bulb. New 12 French catheter was placed within the fistula tract but outside of the bowel.  Estimated blood loss: Minimal  COMPLICATIONS: None  IMPRESSION: Successful replacement of the percutaneous drain. Demonstration of the duodenal bulb fistula. Drain was placed adjacent to the fistula opening but outside of the bowel.   Electronically Signed   By: Richarda Overlie M.D.   On: 09/01/2014 13:39    Echo: 07/23/14: Study Conclusions  - Left ventricle: The cavity size was normal. There was moderate concentric hypertrophy. Systolic function was normal. The estimated ejection fraction was in the range of 60% to 65%. Wall motion was normal; there were no regional wall motion abnormalities. Doppler parameters are consistent with abnormal left ventricular relaxation (grade 1 diastolic dysfunction). There was no evidence of elevated ventricular filling pressure by Doppler parameters. - Ventricular septum: The contour showed diastolic flattening and systolic flattening consistent with right ventricular volume and pressure overload . - Aortic valve: Trileaflet; mildly thickened, mildly calcified leaflets. Transvalvular velocity was within the normal range. There was no stenosis. There was no regurgitation. - Mitral valve: Mildly thickened leaflets . There was no regurgitation. - Right ventricle: The cavity size was severely dilated. Wall thickness was normal. Systolic function was mildly to moderately reduced. - Right atrium: The atrium was severely dilated. - Tricuspid valve: There was  moderate-severe regurgitation. - Pulmonic valve: There was no regurgitation. - Pulmonary arteries: The main pulmonary artery was normal-sized. Systolic pressure was severely increased. PA peak pressure: 90 mm Hg (S). - Inferior vena cava: The vessel was normal in size. - Pericardium, extracardiac: There was no pericardial effusion.  Impressions:  - Normal LV systolic function, impaired relaxation with normal filling pressures. Severely dilated right sided chamber and severe pulmonary hypertension consistent with cor pulmonale.   PHYSICAL EXAM General: Well developed, chronically ill, in no acute distress. Head: Normocephalic, atraumatic, sclera non-icteric, oropharynx is clear Neck: Normal Lungs: Bilateral coarse rhonchi and wheezing. Breathing is unlabored. Heart: RRR S1 S2 without murmurs, rubs, or gallops.  Abdomen: Soft, non-tender, non-distended with normoactive bowel sounds.  Extremities: No clubbing, cyanosis or edema.  Distal pedal pulses are 2+ and equal bilaterally. Neuro: Alert and oriented X 3. Moves all extremities spontaneously. Psych:  Responds to questions appropriately with a normal affect.  ASSESSMENT AND PLAN:   The patient has a  long and complex history. She has been hospitalized since March, 2016. She has had supraventricular tachycardia. 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 Beta blockade helps some. Currently her blood pressure is on the low side and there is hesitation to give her beta blocker. The patient had a CVA during this admission. 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 There has been no documented atrial fibrillation. It has been felt that amiodarone is the only drug that will definitely help with her rhythm. She was on amiodarone for a period of time. This included oral amiodarone. She could no longer take oral meds after while in the amiodarone was stopped. She has been seen by the  cardiology team intermittently since March, 2016.   1. PAT-  atrial tachycardia. Now on oral metoprolol and amiodarone (metoprolol 12.5 mg bid and amiodarone 400 mg bid.) will continue for now. Unable to titrate further due to low BP. Hopefully as she gets more amiodarone we will see improvement. Little else to offer.   2. ESRD on dialysis  3. History of CVA. No documented Afib.  4. Copd with cor pulmonale. Lungs without wheezing today.   5. S/p exploratory lap with closure of perforated pyloric ulcer/drainage of abdominal abscess. Post op ileus and enterocutaneous fistula. Pelvic abscess.  Present on Admission:  . Acute respiratory failure with hypoxia . Cerebral thrombosis with cerebral infarction . Hypertension . Obesity (BMI 30-39.9) . Depression . Left renal mass . Chronic diastolic heart failure . H1N1 influenza . Tobacco use disorder . Renal failure (ARF), acute on chronic . Hyperlipidemia . SOB (shortness of breath) . Perforated gastric ulcer . ESRD needing dialysis  Signed, Zahara Rembert [place], MDFACC 09/03/2014 10:17 AM

## 2014-09-03 NOTE — Progress Notes (Signed)
[name] Progress Note  Assessment/Plan:  1.  New ESRD 1. On TTS schedule as inpatient 2. Using TDC 3. CLIP on hold as still significant inpatient issues 4. Has AVF from 01/28/14; will need to address prior to DC 1. Last seen by [facility] 06/2014; likely needing revision [facility] 5. 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 Cont with 4K bath next for rhythm prevention 7. Panic/acute SOB w HD > will try rinsing dialyzer w 2L saline and also IV ativan pre HD 2. Vol Status 1. Sig leg edema bilat, good UF w HD yesterday 3. Pseudomonal pelvic abscess on Ceftaz/Flagyl, hx/o C Diff 4. CVA acute by MRI 3/19 5. Perf pyloric ulcer SP exlap, w duod fistula to suction, output down some, CCS following 6. Nutrition w G-J tube on TF's and eating solids 7. C diff - s/p po Vanc 8. Anemia  1. aranesp to 200 qThurs 2. Finished Fe load 5/21 3. Transfused 2u PRBC 08/27/14 9. MBD - iPTH 130 5/12, low P resolved  10. Parox SVT on IV MTP q 4hrs.  Restarted Amio 5/28 11. 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    Stephanie Moselleob [name] MD (pgr) [phone]    [phone] 09/03/2014, 11:42 AM   Subjective:    2.75kg off with HD yesterday and stable BP's.  Is walking now some with a walker.  Sits in the chair for some time as well.  No complaints.     Objective Filed Vitals:   09/03/14 0425 09/03/14 0500 09/03/14 0721 09/03/14 0756  BP: 136/72  98/55   Pulse: 101  109   Temp: 98.1 F (36.7 C)  98 F (36.7 C)   TempSrc: Oral  Oral   Resp: 23  19   Height:      Weight:  88.4 kg (194 lb 14.2 oz)    SpO2: 98%  97% 97%   Physical Exam General: Walking with PT Heart:tachy reg Lungs:clear to A/P now Abdomen: obese, anasarca with 2+ dependent back/buttock edema Extremities: 2+ dependent edema LLE > RLE Dialysis Access: right IJ    Dialysis Orders:  4h  UF max   Heparin none   Max blood flow  Additional Objective Labs: Basic Metabolic Panel:  Recent Labs Lab 08/29/14 0520 09/01/14 1430 09/03/14 0350  NA 140 142 137  K 3.5 3.7 4.2  CL 102 95* 97*  CO2 28 28 26   GLUCOSE 104* 104* 93  BUN 32* 43* 37*  CREATININE 4.48* 5.72* 4.67*  CALCIUM 8.3* 8.2* 8.2*  PHOS 4.4  --   --    Liver Function Tests:  Recent Labs Lab 08/29/14 0520  ALBUMIN 1.9*  CBC:  Recent Labs Lab 08/29/14 0520 08/29/14 2008 08/31/14 0600 09/01/14 1430 09/03/14 0350  WBC 14.3* 20.5* 11.3* 11.1* 11.5*  HGB 9.1* 9.9* 9.1* 9.0* 9.3*  HCT 29.3* 31.0* 29.7* 28.7* 29.7*  MCV 94.8 93.9 95.8 93.8 94.6  PLT 303 229 232 252 208   Blood Culture    Component Value Date/Time   SDES ABSCESS PELVIS 08/17/2014 1451   SPECREQUEST NONE 08/17/2014 1451   CULT  08/17/2014 1451    NO GROWTH 3 DAYS [facility]    REPTSTATUS 08/21/2014 FINAL 08/17/2014 1451    Cardiac Enzymes: No  results for input(s): CKTOTAL, CKMB, CKMBINDEX, TROPONINI in the last 168 hours. CBG:  Recent Labs Lab 08/27/14 1218 08/27/14 1817 09/01/14 2350  GLUCAP 112* 157* 97    Medications: . sodium chloride 10 mL/hr at 08/19/14 0950   . sodium chloride   Intravenous Once  . ALPRAZolam  0.5 mg Oral Q T,Th,Sa-HD  . amiodarone  400 mg Oral BID  . antiseptic oral rinse  7 mL Mouth Rinse q12n4p  . aspirin  81 mg Oral Daily  . budesonide (PULMICORT) nebulizer solution  0.25 mg Nebulization BID  . cefTAZidime (FORTAZ)  IV  2 g Intravenous Q T,Th,Sa-HD  . chlorhexidine  15 mL Mouth Rinse BID  . darbepoetin (ARANESP) injection - DIALYSIS  200 mcg Intravenous Q Thu-HD  . feeding supplement (VITAL AF 1.2 CAL)  1,000 mL Per Tube Q24H  . ipratropium  0.5 mg Nebulization TID  . levalbuterol  1.25 mg Nebulization TID  . levothyroxine  12.5 mcg Intravenous Daily  . LORazepam  1-2 mg Intravenous Q T,Th,Sa-HD  . metoprolol tartrate  12.5 mg  Oral BID  . metronidazole  500 mg Intravenous Q8H  . sodium chloride  10-40 mL Intracatheter Q12H

## 2014-09-03 NOTE — Progress Notes (Signed)
Rehab admissions - I spoke with patient today.  She would like to stay here for her rehab.  She lives with her daughter and the daughter works 2:30 pm to 11 pm.  Son works days.  Patient says they can both assist her.  Once drains are out, patient eating, can consider inpatient rehab admission.  Call me for questions.  #[phone]

## 2014-09-03 NOTE — Progress Notes (Signed)
RN from dialysis called RT stating pt requesting HHN tx. RT explained to RN it was too early as she had just had one at 14:17 and they are scheduled TID and Q4 PRN. RN placed pt on phone and RT explained to pt it was too early and that she would have relief from SOB with dialysis and would be able to have a TX at 18:17. Pt frustrated with RT and stated the MD told her she could have a tx in dialysis if needed, however there are no orders or documentation for that. RT informed RN she made need to call 2C RN about something for anxiety to help the pt

## 2014-09-04 ENCOUNTER — Inpatient Hospital Stay (HOSPITAL_COMMUNITY): Payer: Medicaid Other

## 2014-09-04 DIAGNOSIS — M79609 Pain in unspecified limb: Secondary | ICD-10-CM

## 2014-09-04 LAB — BASIC METABOLIC PANEL
ANION GAP: 9 (ref 5–15)
BUN: 19 mg/dL (ref 6–20)
CALCIUM: 7.7 mg/dL — AB (ref 8.9–10.3)
CO2: 27 mmol/L (ref 22–32)
Chloride: 99 mmol/L — ABNORMAL LOW (ref 101–111)
Creatinine, Ser: 2.86 mg/dL — ABNORMAL HIGH (ref 0.44–1.00)
GFR calc Af Amer: 20 mL/min — ABNORMAL LOW (ref >60)
GFR calc non Af Amer: 17 mL/min — ABNORMAL LOW (ref >60)
Glucose, Bld: 92 mg/dL (ref 65–99)
Potassium: 4.3 mmol/L (ref 3.5–5.1)
SODIUM: 135 mmol/L (ref 135–145)

## 2014-09-04 LAB — CBC
HCT: 28.8 % — ABNORMAL LOW (ref 36.0–46.0)
Hemoglobin: 9 g/dL — ABNORMAL LOW (ref 12.0–15.0)
MCH: 29.3 pg (ref 26.0–34.0)
MCHC: 31.3 g/dL (ref 30.0–36.0)
MCV: 93.8 fL (ref 78.0–100.0)
Platelets: 190 10*3/uL (ref 150–400)
RBC: 3.07 MIL/uL — ABNORMAL LOW (ref 3.87–5.11)
RDW: 21.9 % — AB (ref 11.5–15.5)
WBC: 9.9 10*3/uL (ref 4.0–10.5)

## 2014-09-04 MED ORDER — LORAZEPAM 2 MG/ML IJ SOLN
1.0000 mg | Freq: Every day | INTRAMUSCULAR | Status: DC | PRN
  Administered 2014-09-09: 1 mg via INTRAVENOUS
  Filled 2014-09-04: qty 1

## 2014-09-04 MED ORDER — VITAL AF 1.2 CAL PO LIQD
1000.0000 mL | ORAL | Status: DC
  Administered 2014-09-04 – 2014-09-05 (×2): 1000 mL
  Filled 2014-09-04 (×4): qty 1000

## 2014-09-04 MED ORDER — BOOST / RESOURCE BREEZE PO LIQD
1.0000 | Freq: Three times a day (TID) | ORAL | Status: DC
  Administered 2014-09-04 – 2014-09-11 (×9): 1 via ORAL

## 2014-09-04 NOTE — Progress Notes (Signed)
TELEMETRY: Reviewed telemetry pt in sinus tachycardia rate 107, PAT still occuring some but less frequent.  Patient is not aware.: Filed Vitals:   09/04/14 0534 09/04/14 0645 09/04/14 0756 09/04/14 0800  BP: 83/55 95/62  86/58  Pulse:    112  Temp: 97.3 F (36.3 C)     TempSrc: Oral     Resp: Height:      Weight:      SpO2: 100% 96% 99%     Intake/Output Summary (Last 24 hours) at 09/04/14 0825 Last data filed at 09/04/14 0800  Gross per 24 hour  Intake   1600 ml  Output   3000 ml  Net  -1400 ml   Filed Weights   09/02/14 0413 09/03/14 0500 09/04/14 0500  Weight: 96.5 kg (212 lb 11.9 oz) 88.4 kg (194 lb 14.2 oz) 93.759 kg (206 lb 11.2 oz)    Subjective  No chest pain or palpitations. For dialysis today.  . sodium chloride   Intravenous Once  . ALPRAZolam  0.5 mg Oral Q T,Th,Sa-HD  . amiodarone  400 mg Oral BID  . antiseptic oral rinse  7 mL Mouth Rinse q12n4p  . aspirin  81 mg Oral Daily  . budesonide (PULMICORT) nebulizer solution  0.25 mg Nebulization BID  . cefTAZidime (FORTAZ)  IV  2 g Intravenous Q T,Th,Sa-HD  . chlorhexidine  15 mL Mouth Rinse BID  . darbepoetin (ARANESP) injection - DIALYSIS  200 mcg Intravenous Q Thu-HD  . feeding supplement (VITAL AF 1.2 CAL)  1,000 mL Per Tube Q24H  . ipratropium  0.5 mg Nebulization TID  . levalbuterol  1.25 mg Nebulization TID  . levothyroxine  25 mcg Oral QAC breakfast  . LORazepam  1-2 mg Intravenous Q T,Th,Sa-HD  . metoprolol tartrate  12.5 mg Oral BID  . metronidazole  500 mg Intravenous Q8H  . sodium chloride  10-40 mL Intracatheter Q12H   . sodium chloride 10 mL/hr at 08/19/14 0950    LABS: Basic Metabolic Panel:  Recent Labs  57/84/69 0350 09/04/14 0430  NA 137 135  K 4.2 4.3  CL 97* 99*  CO2 26 27  GLUCOSE 93 92  BUN 37* 19  CREATININE 4.67* 2.86*  CALCIUM 8.2* 7.7*   Liver Function Tests: No results for input(s): AST, ALT, ALKPHOS, BILITOT, PROT, ALBUMIN in the last 72 hours. No  results for input(s): LIPASE, AMYLASE in the last 72 hours. CBC:  Recent Labs  09/03/14 0350 09/04/14 0430  WBC 11.5* 9.9  HGB 9.3* 9.0*  HCT 29.7* 28.8*  MCV 94.6 93.8  PLT 208 190   Cardiac Enzymes: No results for input(s): CKTOTAL, CKMB, CKMBINDEX, TROPONINI in the last 72 hours. BNP: No results for input(s): PROBNP in the last 72 hours. D-Dimer: No results for input(s): DDIMER in the last 72 hours. Hemoglobin A1C: No results for input(s): HGBA1C in the last 72 hours. Fasting Lipid Panel: No results for input(s): CHOL, HDL, LDLCALC, TRIG, CHOLHDL, LDLDIRECT in the last 72 hours. Thyroid Function Tests: No results for input(s): TSH, T4TOTAL, T3FREE, THYROIDAB in the last 72 hours.  Invalid input(s): FREET3   Radiology/Studies:  No results found.  Echo: 07/23/14: Study Conclusions  - Left ventricle: The cavity size was normal. There was moderate concentric hypertrophy. Systolic function was normal. The estimated ejection fraction was in the range of 60% to 65%. Wall motion was normal; there were no regional wall motion abnormalities. Doppler parameters are consistent with abnormal left ventricular  relaxation (grade 1 diastolic dysfunction). There was no evidence of elevated ventricular filling pressure by Doppler parameters. - Ventricular septum: The contour showed diastolic flattening and systolic flattening consistent with right ventricular volume and pressure overload . - Aortic valve: Trileaflet; mildly thickened, mildly calcified leaflets. Transvalvular velocity was within the normal range. There was no stenosis. There was no regurgitation. - Mitral valve: Mildly thickened leaflets . There was no regurgitation. - Right ventricle: The cavity size was severely dilated. Wall thickness was normal. Systolic function was mildly to moderately reduced. - Right atrium: The atrium was severely dilated. - Tricuspid valve: There was  moderate-severe regurgitation. - Pulmonic valve: There was no regurgitation. - Pulmonary arteries: The main pulmonary artery was normal-sized. Systolic pressure was severely increased. PA peak pressure: 90 mm Hg (S). - Inferior vena cava: The vessel was normal in size. - Pericardium, extracardiac: There was no pericardial effusion.  Impressions:  - Normal LV systolic function, impaired relaxation with normal filling pressures. Severely dilated right sided chamber and severe pulmonary hypertension consistent with cor pulmonale.   PHYSICAL EXAM General: Well developed, chronically ill, in no acute distress. Head: Normocephalic, atraumatic, sclera non-icteric, oropharynx is clear Neck: Normal Lungs: Bilateral coarse rhonchi and wheezing. Breathing is unlabored. Heart: RRR S1 S2 without murmurs, rubs, or gallops.  Abdomen: Soft, non-tender, non-distended with normoactive bowel sounds.  Extremities: No clubbing, cyanosis or edema.  Distal pedal pulses are 2+ and equal bilaterally. Neuro: Alert and oriented X 3. Moves all extremities spontaneously. Psych:  Responds to questions appropriately with a normal affect.  ASSESSMENT AND PLAN:   The patient has a long and complex history. She has been hospitalized since March, 2016. She has had supraventricular tachycardia. 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 Beta blockade helps some. Currently her blood pressure is on the low side and there is hesitation to give her beta blocker. The patient had a CVA during this admission. 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 There has been no documented atrial fibrillation. It has been felt that amiodarone is the only drug that will definitely help with her rhythm. She was on amiodarone for a period of time. This included oral amiodarone. She could no longer take oral meds after while in the amiodarone was stopped. She has been seen by the  cardiology team intermittently since March, 2016.   1. PAT-  atrial tachycardia. Now on oral metoprolol and amiodarone (metoprolol 12.5 mg bid and amiodarone 400 mg bid.) will continue for now. Unable to titrate further due to low BP. Little else to offer from a cardiac standpoint. She will need follow up LFTs and TSH as outpatient. Cardiology will sign off now. Please call if needed.   2. ESRD on dialysis  3. History of CVA. No documented Afib.  4. Copd with cor pulmonale. Lungs without wheezing today.   5. S/p exploratory lap with closure of perforated pyloric ulcer/drainage of abdominal abscess. Post op ileus and enterocutaneous fistula. Pelvic abscess.  Present on Admission:  . Acute respiratory failure with hypoxia . Cerebral thrombosis with cerebral infarction . Hypertension . Obesity (BMI 30-39.9) . Depression . Left renal mass . Chronic diastolic heart failure . H1N1 influenza . Tobacco use disorder . Renal failure (ARF), acute on chronic . Hyperlipidemia . SOB (shortness of breath) . Perforated gastric ulcer . ESRD needing dialysis  Signed, Peter [place], MDFACC 09/04/2014 8:25 AM

## 2014-09-04 NOTE — Progress Notes (Signed)
VASCULAR LAB PRELIMINARY  PRELIMINARY  PRELIMINARY  PRELIMINARY  Left lower extremity venous duplex completed.    Preliminary report:  Left:  No evidence of DVT, superficial thrombosis, or Baker's cyst.  Steffani Dionisio, RVS 09/04/2014, 8:46 AM

## 2014-09-04 NOTE — Progress Notes (Signed)
[name], female   DOB: [birth year], 59 y.o.   MRN: [id number]     [facility]      [address] [place], [place] [address]    Phone: [phone] FAX: [phone]     Subjective: Sore. No n/w.  Ate ice cream and orange juice.  Normal white count.   No output from drain.    Objective:  Vital signs:  Filed Vitals:   09/04/14 0534 09/04/14 0645 09/04/14 0756 09/04/14 0800  BP: 83/55 95/62  86/58  Pulse:    112  Temp: 97.3 F (36.3 C)     TempSrc: Oral     Resp: $Remo'22 21  18  'Qjlys$ Height:      Weight:      SpO2: 100% 96% 99%     Last BM Date: 09/03/14  Intake/Output   Yesterday:  06/02 0701 - 06/03 0700 In: 1600 [P.O.:360; I.V.:10; NG/GT:1130; IV Piggyback:100] Out: 3000  This shift:  Total I/O In: 55 [NG/GT:55] Out: -   Physical Exam: General: Pt awake/alert/oriented x4 in n9o acute distress Abdomen: Soft. Nondistended. Mildly tender around wound. Wound is c/d/i. G tube clamped. J tube with TF. No output in perc drain. No evidence of peritonitis. No incarcerated hernias.   Problem List:   Principal Problem:   Acute respiratory failure with hypoxia Active Problems:   Cerebral thrombosis with cerebral infarction   Hypertension   ESRD needing dialysis   Obesity (BMI 30-39.9)   Depression   Left renal mass   Chronic diastolic heart failure   A3E9 influenza   Tobacco use disorder   Renal failure (ARF), acute on chronic   Hyperlipidemia   PSVT (paroxysmal supraventricular tachycardia)   SOB (shortness of breath)   Perforated gastric ulcer   Abnormal TSH   History of intermediate V/Q scan   HIT (heparin-induced thrombocytopenia)   Fistula   Abdominal pain   Palliative care encounter   ESRD (end stage renal disease)   SVT (supraventricular tachycardia)   Right heart failure   Duodenal anastomotic leak   Protein calorie malnutrition   Itching   Aortic aneurysm   Protein-calorie  malnutrition   Sepsis   Duodenal fistula    Results:   Labs: Results for orders placed or performed during the hospital encounter of 06/17/14 (from the past 48 hour(s))  CBC     Status: Abnormal   Collection Time: 09/03/14  3:50 AM  Result Value Ref Range   WBC 11.5 (H) 4.0 - 10.5 K/uL   RBC 3.14 (L) 3.87 - 5.11 MIL/uL   Hemoglobin 9.3 (L) 12.0 - 15.0 g/dL   HCT 29.7 (L) 36.0 - 46.0 %   MCV 94.6 78.0 - 100.0 fL   MCH 29.6 26.0 - 34.0 pg   MCHC 31.3 30.0 - 36.0 g/dL   RDW 22.8 (H) 11.5 - 15.5 %   Platelets 208 150 - 400 K/uL  Basic metabolic panel     Status: Abnormal   Collection Time: 09/03/14  3:50 AM  Result Value Ref Range   Sodium 137 135 - 145 mmol/L   Potassium 4.2 3.5 - 5.1 mmol/L   Chloride 97 (L) 101 - 111 mmol/L   CO2 26 22 - 32 mmol/L   Glucose, Bld 93 65 - 99 mg/dL   BUN 37 (H) 6 - 20 mg/dL   Creatinine, Ser 4.67 (H) 0.44 - 1.00 mg/dL   Calcium 8.2 (L) 8.9 - 10.3 mg/dL  GFR calc non Af Amer 9 (L) >60 mL/min   GFR calc Af Amer 11 (L) >60 mL/min    Comment: (NOTE) The eGFR has been calculated using the CKD EPI equation. This calculation has not been validated in all clinical situations. eGFR's persistently <60 mL/min signify possible Chronic Kidney Disease.    Anion gap 14 5 - 15  Basic metabolic panel     Status: Abnormal   Collection Time: 09/04/14  4:30 AM  Result Value Ref Range   Sodium 135 135 - 145 mmol/L   Potassium 4.3 3.5 - 5.1 mmol/L   Chloride 99 (L) 101 - 111 mmol/L   CO2 27 22 - 32 mmol/L   Glucose, Bld 92 65 - 99 mg/dL   BUN 19 6 - 20 mg/dL   Creatinine, Ser 2.86 (H) 0.44 - 1.00 mg/dL    Comment: DELTA CHECK NOTED   Calcium 7.7 (L) 8.9 - 10.3 mg/dL   GFR calc non Af Amer 17 (L) >60 mL/min   GFR calc Af Amer 20 (L) >60 mL/min    Comment: (NOTE) The eGFR has been calculated using the CKD EPI equation. This calculation has not been validated in all clinical situations. eGFR's persistently <60 mL/min signify possible Chronic  Kidney Disease.    Anion gap 9 5 - 15  CBC     Status: Abnormal   Collection Time: 09/04/14  4:30 AM  Result Value Ref Range   WBC 9.9 4.0 - 10.5 K/uL   RBC 3.07 (L) 3.87 - 5.11 MIL/uL   Hemoglobin 9.0 (L) 12.0 - 15.0 g/dL   HCT 28.8 (L) 36.0 - 46.0 %   MCV 93.8 78.0 - 100.0 fL   MCH 29.3 26.0 - 34.0 pg   MCHC 31.3 30.0 - 36.0 g/dL   RDW 21.9 (H) 11.5 - 15.5 %   Platelets 190 150 - 400 K/uL    Imaging / Studies: No results found.  Medications / Allergies:  Scheduled Meds: . sodium chloride   Intravenous Once  . ALPRAZolam  0.5 mg Oral Q T,Th,Sa-HD  . amiodarone  400 mg Oral BID  . antiseptic oral rinse  7 mL Mouth Rinse q12n4p  . aspirin  81 mg Oral Daily  . budesonide (PULMICORT) nebulizer solution  0.25 mg Nebulization BID  . cefTAZidime (FORTAZ)  IV  2 g Intravenous Q T,Th,Sa-HD  . chlorhexidine  15 mL Mouth Rinse BID  . darbepoetin (ARANESP) injection - DIALYSIS  200 mcg Intravenous Q Thu-HD  . feeding supplement (VITAL AF 1.2 CAL)  1,000 mL Per Tube Q24H  . ipratropium  0.5 mg Nebulization TID  . levalbuterol  1.25 mg Nebulization TID  . levothyroxine  25 mcg Oral QAC breakfast  . LORazepam  1-2 mg Intravenous Q T,Th,Sa-HD  . metoprolol tartrate  12.5 mg Oral BID  . metronidazole  500 mg Intravenous Q8H  . sodium chloride  10-40 mL Intracatheter Q12H   Continuous Infusions: . sodium chloride 10 mL/hr at 08/19/14 0950   PRN Meds:.bisacodyl, camphor-menthol, diphenhydrAMINE, diphenhydrAMINE-zinc acetate, diphenoxylate-atropine, HYDROmorphone (DILAUDID) injection, levalbuterol, ondansetron (ZOFRAN) IV, sodium chloride  Antibiotics: Anti-infectives    Start     Dose/Rate Route Frequency Ordered Stop   08/27/14 1900  cefTAZidime (FORTAZ) 2 g in dextrose 5 % 50 mL IVPB     2 g 100 mL/hr over 30 Minutes Intravenous Every T-Th-Sa (Hemodialysis) 08/27/14 1846     08/27/14 1900  metroNIDAZOLE (FLAGYL) IVPB 500 mg     500 mg 100 mL/hr  over 60 Minutes Intravenous Every  8 hours 08/27/14 1847     08/21/14 1430  cefTAZidime (FORTAZ) 1 g in dextrose 5 % 50 mL IVPB     1 g 100 mL/hr over 30 Minutes Intravenous  Once 08/21/14 1426 08/21/14 1547   08/18/14 1300  metroNIDAZOLE (FLAGYL) IVPB 500 mg  Status:  Discontinued     500 mg 100 mL/hr over 60 Minutes Intravenous 3 times per day 08/18/14 1045 08/27/14 1847   08/18/14 1200  cefTAZidime (FORTAZ) 2 g in dextrose 5 % 50 mL IVPB  Status:  Discontinued     2 g 100 mL/hr over 30 Minutes Intravenous Every T-Th-Sa (Hemodialysis) 08/18/14 1054 08/27/14 1846   08/12/14 1449  ceFAZolin (ANCEF) 2-3 GM-% IVPB SOLR    Comments:  Troy Sine   : cabinet override      08/12/14 1449 08/12/14 1629   08/12/14 1130  ceFAZolin (ANCEF) IVPB 2 g/50 mL premix    Comments:  Hang ON CALL to xray 5/11   2 g 100 mL/hr over 30 Minutes Intravenous To Radiology 08/12/14 1035 08/12/14 1520   08/04/14 1200  vancomycin (VANCOCIN) 50 mg/mL oral solution 125 mg  Status:  Discontinued     125 mg Oral 4 times per day 08/04/14 0851 08/08/14 1229   07/18/14 1800  vancomycin (VANCOCIN) 50 mg/mL oral solution 125 mg  Status:  Discontinued     125 mg Oral 4 times per day 07/18/14 1455 08/04/14 0845   07/17/14 1830  vancomycin (VANCOCIN) IVPB 750 mg/150 ml premix     750 mg 150 mL/hr over 60 Minutes Intravenous  Once 07/17/14 1818 07/18/14 0024   07/15/14 1400  metroNIDAZOLE (FLAGYL) tablet 500 mg  Status:  Discontinued     500 mg Oral 3 times per day 07/15/14 1202 07/18/14 1533   07/13/14 2200  piperacillin-tazobactam (ZOSYN) IVPB 2.25 g  Status:  Discontinued     2.25 g 100 mL/hr over 30 Minutes Intravenous 3 times per day 07/13/14 1318 07/24/14 1138   07/13/14 2000  fluconazole (DIFLUCAN) IVPB 200 mg  Status:  Discontinued     200 mg 100 mL/hr over 60 Minutes Intravenous Every 24 hours 07/13/14 1318 07/19/14 1027   07/07/14 1200  vancomycin (VANCOCIN) IVPB 1000 mg/200 mL premix  Status:  Discontinued     1,000 mg 200 mL/hr over 60 Minutes  Intravenous Every 24 hours 07/07/14 0937 07/14/14 1712   07/06/14 2000  fluconazole (DIFLUCAN) IVPB 400 mg  Status:  Discontinued     400 mg 100 mL/hr over 120 Minutes Intravenous Every 24 hours 07/06/14 1507 07/13/14 1318   07/06/14 1800  piperacillin-tazobactam (ZOSYN) IVPB 2.25 g  Status:  Discontinued     2.25 g 100 mL/hr over 30 Minutes Intravenous 4 times per day 07/06/14 1505 07/13/14 1318   07/05/14 2000  fluconazole (DIFLUCAN) IVPB 200 mg  Status:  Discontinued     200 mg 100 mL/hr over 60 Minutes Intravenous Every 24 hours 07/05/14 0757 07/06/14 1507   07/05/14 1400  piperacillin-tazobactam (ZOSYN) IVPB 2.25 g  Status:  Discontinued     2.25 g 100 mL/hr over 30 Minutes Intravenous 3 times per day 07/05/14 0749 07/06/14 1505   07/02/14 1800  vancomycin (VANCOCIN) IVPB 1000 mg/200 mL premix  Status:  Discontinued     1,000 mg 200 mL/hr over 60 Minutes Intravenous Every 24 hours 07/01/14 1858 07/05/14 0801   07/01/14 2200  piperacillin-tazobactam (ZOSYN) IVPB 3.375 g  Status:  Discontinued  3.375 g 100 mL/hr over 30 Minutes Intravenous 4 times per day 07/01/14 1858 07/05/14 0749   07/01/14 2000  fluconazole (DIFLUCAN) IVPB 400 mg  Status:  Discontinued     400 mg 100 mL/hr over 120 Minutes Intravenous Every 24 hours 07/01/14 1858 07/05/14 0757   07/01/14 1400  fluconazole (DIFLUCAN) IVPB 200 mg  Status:  Discontinued     200 mg 100 mL/hr over 60 Minutes Intravenous Every 24 hours 07/01/14 1330 07/01/14 1853   07/01/14 1000  piperacillin-tazobactam (ZOSYN) IVPB 2.25 g  Status:  Discontinued     2.25 g 100 mL/hr over 30 Minutes Intravenous Every 8 hours 07/01/14 0952 07/01/14 1853   07/01/14 1000  vancomycin (VANCOCIN) 500 mg in sodium chloride 0.9 % 100 mL IVPB     500 mg 100 mL/hr over 60 Minutes Intravenous  Once 07/01/14 0952 07/01/14 1126   06/30/14 1200  vancomycin (VANCOCIN) IVPB 1000 mg/200 mL premix     1,000 mg 200 mL/hr over 60 Minutes Intravenous  Once 06/30/14  0944 06/30/14 1403   06/30/14 1200  ceFEPIme (MAXIPIME) 2 g in dextrose 5 % 50 mL IVPB     2 g 100 mL/hr over 30 Minutes Intravenous  Once 06/30/14 0944 06/30/14 1425   06/30/14 0100  vancomycin (VANCOCIN) 2,000 mg in sodium chloride 0.9 % 500 mL IVPB     2,000 mg 250 mL/hr over 120 Minutes Intravenous  Once 06/30/14 0048 06/30/14 0525   06/30/14 0100  ceFEPIme (MAXIPIME) 2 g in dextrose 5 % 50 mL IVPB     2 g 100 mL/hr over 30 Minutes Intravenous  Once 06/30/14 0048 06/30/14 0322   06/27/14 0600  cefUROXime (ZINACEF) 1.5 g in dextrose 5 % 50 mL IVPB     1.5 g 100 mL/hr over 30 Minutes Intravenous On call to O.R. 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   Dispo-per primary team, surgically stable   Erby Pian, [facility] Surgery Pager [phone]) For consults and floor pages call [phone](7A-4:30P)  09/04/2014 9:33 AM

## 2014-09-04 NOTE — Progress Notes (Signed)
[facility] Progress Note  Assessment/Plan:  1.  New ESRD 1. On TTS schedule as inpatient 2. Using TDC 3. CLIP on hold as still significant inpatient issues 4. Has AVF from 01/28/14; will need to address prior to DC 1. Last seen by [facility] 06/2014; likely needing revision 2. [facility] working well for now 5. 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 Cont with 4K bath next for rhythm prevention 7. Panic/acute SOB w HD > rinsing dialyzer w 2L saline and xanax po pre HD. Did better w HD yesterday 2. Vol Status 1. Sig leg edema bilat, good UF  3. Pseudomonal pelvic abscess on Ceftaz/Flagyl, hx/o C Diff 4. CVA acute by MRI 3/19 5. Perf pyloric ulcer SP exlap, w duod fistula to suction, output down, CCS following 6. Nutrition w G-J tube on TF's and eating solids 7. C diff - s/p po Vanc 8. Anemia  1. aranesp to 200 qThurs 2. Finished Fe load 5/21 3. Transfused 2u PRBC 08/27/14 9. MBD - iPTH 130 5/12, low P resolved  10. Parox SVT on po MTP 12.5 bid and po Amio 400 bid, better 11. 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 Left LE edema - doppler ordered, done this am and results pending  Plan - HD tomorrow w dialyzer rinse and xanax po before   [name] MD (pgr) [phone]    [phone] 09/04/2014, 10:22 AM   Subjective:      Objective Filed Vitals:   09/04/14 0645 09/04/14 0756 09/04/14 0800 09/04/14 0939  BP: 95/62  86/58 101/76  Pulse:   112 120  Temp:    97.4 F (36.3 C)  TempSrc:    Oral  Resp: Height:      Weight:      SpO2: 96% 99%  98%   Physical Exam General: Walking with PT Heart: tachy reg Lungs: clear to A/P now Abdomen: obese, anasarca with 2+ dependent back/buttock edema Extremities: 2+ dependent edema LLE > RLE Dialysis Access: right IJ   Dialysis Orders:  4h  UF max   Heparin none   Max blood flow  Additional Objective Labs: Basic Metabolic Panel:  Recent Labs Lab  08/29/14 0520 09/01/14 1430 09/03/14 0350 09/04/14 0430  NA 140 142 137 135  K 3.5 3.7 4.2 4.3  CL 102 95* 97* 99*  CO2 GLUCOSE 104* 104* 93 92  BUN 32* 43* 37* 19  CREATININE 4.48* 5.72* 4.67* 2.86*  CALCIUM 8.3* 8.2* 8.2* 7.7*  PHOS 4.4  --   --   --    Liver Function Tests:  Recent Labs Lab 08/29/14 0520  ALBUMIN 1.9*  CBC:  Recent Labs Lab 08/29/14 2008 08/31/14 0600 09/01/14 1430 09/03/14 0350 09/04/14 0430  WBC 20.5* 11.3* 11.1* 11.5* 9.9  HGB 9.9* 9.1* 9.0* 9.3* 9.0*  HCT 31.0* 29.7* 28.7* 29.7* 28.8*  MCV 93.9 95.8 93.8 94.6 93.8  PLT 229 232 252 208 190   Blood Culture    Component Value Date/Time   SDES ABSCESS PELVIS 08/17/2014 1451   SPECREQUEST NONE 08/17/2014 1451   CULT  08/17/2014 1451    NO GROWTH 3 DAYS Performed at [facility]    REPTSTATUS 08/21/2014 FINAL 08/17/2014 1451    Cardiac Enzymes: No results for input(s): CKTOTAL, CKMB, CKMBINDEX, TROPONINI in the last 168 hours. CBG:  Recent Labs Lab 09/01/14 2350  GLUCAP 97  Medications: . sodium chloride 10 mL/hr at 08/19/14 0950   . sodium chloride   Intravenous Once  . ALPRAZolam  0.5 mg Oral Q T,Th,Sa-HD  . amiodarone  400 mg Oral BID  . antiseptic oral rinse  7 mL Mouth Rinse q12n4p  . aspirin  81 mg Oral Daily  . budesonide (PULMICORT) nebulizer solution  0.25 mg Nebulization BID  . cefTAZidime (FORTAZ)  IV  2 g Intravenous Q T,Th,Sa-HD  . chlorhexidine  15 mL Mouth Rinse BID  . darbepoetin (ARANESP) injection - DIALYSIS  200 mcg Intravenous Q Thu-HD  . feeding supplement (VITAL AF 1.2 CAL)  1,000 mL Per Tube Q24H  . ipratropium  0.5 mg Nebulization TID  . levalbuterol  1.25 mg Nebulization TID  . levothyroxine  25 mcg Oral QAC breakfast  . LORazepam  1-2 mg Intravenous Q T,Th,Sa-HD  . metoprolol tartrate  12.5 mg Oral BID  . metronidazole  500 mg Intravenous Q8H  . sodium chloride  10-40 mL Intracatheter Q12H

## 2014-09-04 NOTE — Progress Notes (Signed)
PROGRESS NOTE  [name] IPJ:[id number] DOB: 1955/06/12 DOA: 06/17/2014 PCP: [name], MD  Brief Narrative: 59 yo ? smoker with hx HTN, COPD, CKD IV followed @ [facility]-[name] Page, failed L AV fistula, initially admitted 3/16 with flu and acute hypoxic resp failure. Ultimately tx to [facility] due to need for HD. L sided weakness 3/18 and found to have R ACA stroke-showed acute infarct R ACA (3/19). Patient more lethargic 3/30 and CT abd=perf viscus 3/30. She was taken to the OR for ex lap for perforated pyloric ulcer with diffuse peritonitis, ischemic necrosis of mid ileum. Patient developed septic shock on 4-1, she was started on pressors and wean off pressors on 4-3. Subsequently, she was diagnosed with pelvic abscess by CT scan perform on 4-7. She underwent pelvic drain placement by IR on 4/8= culture neg 08/15/14 another drained placed in the pelvic cul-de-sac abscess with culture = pseudomonas. On 08/20/14, Gluteal drain was inadvertently pulled out. Repeat CT scan shows improvement in the fluid collection so decision was to leave it out and continue IV abx. Pt hospital stay also complicated by high output duodenal (enterocutaneous fistula) fistula. As a result, pt was made npo and started on TPN. Now stable, on TNA +high output from fistula/leak per [facility]. Had a G_J tube placed by Dr. [name] of IR and Gen surgery has continued to assist with management of her multiple surgical comorbidities C. diff diagnosed 4-12-initially unresponsive to flagyl, and she was started on PO vancomycin 4-16 which she finished 3 week.  5/27--TPN weaned off and increasing TF.  06/24/14 Develops run of SVT, atrial tachycardia. Cardiology was helping with management. Course complicated with high RV pressure and Cor Pulmonale; 4/27 CT angio negative for PE. On 5/2 she developed hypoxemia, hypotension and SVT during dialysis treatment. Dialysis was stopped. She received IV bolus. She was started on BIPAP.  She was transferred to Step down unit. PCCM and cardiology re consulted. Her Hr decreased, BP improved. She was taken off BIPAP. This recurred on 5/15 with episodic SVT. Since then she has had intermitten short episodes of SVT, but on 08/28/14 she has had more sustained SVT, therefore; cardiology was reconsulted on on 08/29/14.  Assessment/Plan: Status post exploratory laparotomy with closure of perforated pyloric ulcer/abdominal abscess / small bowel resection w/ postop ileus and now with enterocutaneous fistula. Pelvic abscess s/p percutaneous drain placement  -completed, long courses of IV  Abx including Vanc and FLuconazole -Due to Fistula, she was made NPO, TPN was started -Fistula study done shows JP drain connection to the duodenal bulb-5-02. - 08/15/2014 repeat CT abdomen with 3.0 x 3.5 x 5.7 cm collection in the pelvic cul-de-sac and extraluminal air alongside JP drain in anterior abd wall -had high output from Fistula/leak. Had G_J placed by IR 08/12/14 at request of gen surgery -CT repeat shows 5 cm post cul-de-sac abscess which was drained on 5/16-culture revealed pseudomonas aeruginosa -ID was consulted as patient had Pseudomonas growing from her culture wounds and they started back Flagyl as well as Elita Quick on 5/16  -Per ID recommendation--plan D#18/21 days of Elita Quick and D#18/ 28 days metronidazole -Patient inadvertently pulled out her gluteal/cul-de-sac drain May 19. Seen by interventional radiology and they recommended repeat CT scan, which was done. Shows that the fluid collection has decreased in size. They do not plan to replace the drain at this time. Continue with antibiotics alone.  -08/21/2014 CT scan did suggest a transmural defect in the  stomach wall near the patient's pyloric Cheree Ditto patch. -Patient accidentally pulled out her JP drain on 5/22. This has been replaced by surgery on 5/23. -Octreotide was discontinued 5/23--enterocutaneous fistula output appears to be decreasing  gradually -finished TPN on 08/27/14 -Enteral Feeding was increased 5/27 to 55 mL per hour--tolerating without residuals -08/29/14--started clear liquids-->tolerating well -08/30/14--increased WBC-->check Cdiff--neg -08/31/14--pt inadvertently pulled out R-side JP drain again (3rd time) during therapy + GJ tube with bloody drainage--contacted general surgery 09/01/14--IR replaced RUQ drain (entercutaneous fistula)--reminded pt to wear abd binder at all times; no output from this drain 6/1: started full liquids  6/3: started regular diet: ATE a Hamburger!!-Huge milestone as far as patient is concerned  PSVT --Atrial tachycardia -Felt to be secondary to fluid metabolic shifts from ESRD and ongoing abd issues; also has severe anxiety with dialysis as well as COPD/Cor Pulmonale  -was on IV metoprolol and amiodarone, now 5/31 switched to PO -V-Q scan with intermediate probability for pulmonary embolism.  -CTA 4/27, negative for PE  -Cards following  ESRD new, now on HD this admit complicated by intermittent hypotension AKI on CKD 4, due to sepsis, shock Ongoing hemodialysis per Nephrology - perm-cath placed 3/26 d/t AVF problems Patient AV fistula reviewed and not mature yet per Dr. Paulene Floor need to revisit prior to d/c -TPN weaned off 08/27/14 -Continue hemodialysis Tuesday, Thursday, Saturday while the patient is in the hospital -Xanax with each HD due to significant anxiety with HD -starting to sit up with HD now  C. difficile colitis still on contact precautions -C diff positive on 4-12, Received 4 days of flagyl without improvement, then started on PO vanc -Completed 3 weeks of Oral Vanc-stopped 5/7, this was 1 week after IV Zosyn was stopped 5/5 -5/28--having loose stools again-->Cdiff PCR negative -keep on contact precautions  ACA CVA with Lt sided weakness -06/23/14--TEE ;normal LV function; no LAA thrombus -Started on ASA 81mg  per Neuro; resumed this -Will need ongoing long  term rehab - PT/OT to cont to follow while inpatient -Patient remains very weak and deconditioned. 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 Fluids/volume managed with hemodialysis.  Elevated PA pressures on 2-D echo; CTA negative for PE - 07/23/2014 echo-- EF 60-61%, grade 1 diastolic dysfunction, Severe RV dilatation, PAP 90  Small PFO Has been evaluated by Cardiology. No intervention planned currently.  Anemia of critical illness/chronic disease -and bleeding from leak/fistula which has resolved. -s/p 2U PRBC 4/13 and 5/10 and 5/26 -Aranesp/iron per renal -transfuse for hemoglobin less than 7  Acute hypoxic Respiratory Failure:  -This was secondary to H1N1, hypervolemia, HCAP >> resolved.  -also has COPD and Cor Pulmonale - now on scheduled Xopenex and Atrovent, change to PRN  - presently stable on 2 L-3L  Thrombocytopenia; resolved.  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 TSH in 7 range -continue low dose synthroid given PSVT, changed to PO synthroid  L Renal mass  -noted on [place], Indeterminate 1.9 cm hypoechoic mass off the interpolar region of the left kidney -may potentially represent  a cyst however solid mass is not excluded -needs outpatient FU for this  Fall on May 18  DVT prophylaxis: SCDs Code Status: FULL Family Communication: Discussed with patient. No family at bedside. Disposition Plan: Have been unable to transfer patient out of stepdown unit due to  dyspnea and tachycardia. She also has complex wound care needs. Dialysis is ongoing. Not ready for discharge. Not accepted to [facility]  Consultants: Cardiology Nephrology PCCM Gen Surgery  Palliative care    Procedures/Studies: Ct Abdomen Pelvis Wo Contrast  08/22/2014   CLINICAL DATA:  Abdominal abscess. No intravenous contrast due to renal failure.  EXAM: CT ABDOMEN AND PELVIS WITHOUT CONTRAST  TECHNIQUE: Multidetector CT imaging of the abdomen and pelvis was performed following the standard protocol without IV contrast.  COMPARISON:  08/15/2014  FINDINGS: BODY WALL: Open laparotomy without fluid collection.  LOWER CHEST: Chronic triangular opacity in the posterior costophrenic sulcus on the left, atelectasis versus scarring  ABDOMEN/PELVIS:  Liver: No focal abnormality.  Biliary: High-density material within the gallbladder showing mild wall thickening which is chronic.  Pancreas: Unremarkable.  Spleen: Unremarkable.  Adrenals: Unremarkable.  Kidneys and ureters: No hydronephrosis or stone. Presumed 1 cm cyst from the interpolar left kidney  Bladder: Decompressed.  No pathologic findings.  Reproductive: Calcified and noncalcified uterine fibroids, better seen on previous enhanced imaging.  Bowel: Right upper quadrant drain related to perforated pyloric ulcer status post Cheree Ditto patch. Specially visible on coronal imaging, there is a gas channel through the pylorus measuring 6 to 9 mm in diameter. No bowel obstruction. Enteroenterostomy noted in the pelvis. Oral contrast again seen in the colon. No notable colonic wall thickening in this patient with history of C difficile. Negative appendix. Percutaneous gastrojejunostomy tube is in good position.  Retroperitoneum: No mass or adenopathy.  Peritoneum: Known thick walled collection in the rectovaginal recess smaller than 08/15/2014 at 40 x 16 mm as compared to 51 x 32 mm. No new collection is identified. No free pneumoperitoneum.  Vascular: 3 cm fusiform  infrarenal aortic aneurysm.  OSSEOUS: No acute abnormalities.  IMPRESSION: 1. Near the patient's pyloric Cheree Ditto patch is a transmural defect in the stomach wall, contiguous with the surgical drain. If repeat surgery is considered, oral contrast could confirm an open ulcer. 2. Decreased rectovaginal recess fluid collection, now 4 x 1.5 cm (previously 5 x 3 cm).   Electronically Signed   By: Marnee Spring M.D.   On: 08/22/2014 01:49   Ct Abdomen Pelvis Wo Contrast  08/15/2014   CLINICAL DATA:  Intra abdominal abscess.  EXAM: CT ABDOMEN AND PELVIS WITHOUT CONTRAST  TECHNIQUE: Multidetector CT imaging of the abdomen and pelvis was performed following the standard protocol without IV contrast.  COMPARISON:  CT scan of Aug 03, 2014.  FINDINGS: Severe degenerative disc disease is noted at L5-S1. Minimal subsegmental atelectasis is noted posteriorly in the left lung base.  No gallstones are noted. No focal abnormality is noted in the liver, spleen or pancreas on these unenhanced images. Adrenal glands appear normal. Stable exophytic cyst is seen arising from midpole of left kidney. No hydronephrosis or renal obstruction is noted. No renal or ureteral calculi are noted. 3 cm infrarenal abdominal aortic aneurysm is noted. Gastrojejunostomy tube is seen entering stomach percutaneously and with distal tip in the jejunum. JP surgical drain is seen entering right upper quadrant of abdomen underneath the liver with distal tip in the left upper quadrant. No significant fluid collection is noted around the drain. Fluid collection is again  identified and posterior cul-de-sac of pelvis measuring 5.1 x 3.2 cm. Calcified degenerating fibroid is noted in the uterus. Urinary bladder is unremarkable. There is no evidence of bowel obstruction. The appendix appears normal. No significant adenopathy is noted. Large midline surgical anterior abdominal wall incision is noted.  IMPRESSION: Stable 3 cm infrarenal abdominal aortic aneurysm.   No change in position of JP surgical drain with distal tip in the left upper quadrant of the abdomen.  Interval placement of percutaneous gastrojejunostomy tube with distal tip in the jejunum.  Grossly stable size and appearance of 5 cm fluid collection seen in posterior cul-de-sac of the pelvis.  Continued presence uterine fibroids.   Electronically Signed   By: Lupita Raider, M.D.   On: 08/15/2014 15:15   Dg Chest 1 View  08/13/2014   CLINICAL DATA:  Shortness of breath  EXAM: CHEST  1 VIEW  COMPARISON:  08/03/2014  FINDINGS: Moderately severe cardiac enlargement. Central line in unchanged position on the right and left side. Mild vascular congestion with no evidence of edema effusion or consolidation.  IMPRESSION: No acute findings   Electronically Signed   By: Esperanza Heir M.D.   On: 08/13/2014 11:04   Dg Chest 2 View  09/01/2014   CLINICAL DATA:  Followup respiratory failure  EXAM: CHEST  2 VIEW  COMPARISON:  08/23/2014  FINDINGS: Moderate cardiac enlargement. Left IJ catheter is identified with tip in the SVC. There is a right-sided dialysis catheter with tips in the SVC. No pleural effusion or edema. No airspace consolidation.  IMPRESSION: 1. Lungs remain clear. 2. Catheters positioned as above.   Electronically Signed   By: Signa Kell M.D.   On: 09/01/2014 09:04   Ir Gastrostomy Tube Mod Sed  08/12/2014   CLINICAL DATA:  Perforated duodenal ulcer, post g patch with persistent leak. Percutaneous gastrojejunostomy as requested for gastric decompression and small bowel enteral feeding support.  EXAM: PERC PLACEMENT GASTROSTOMY;  CONVERT G-TUBE TO G-JTUBE  FLUOROSCOPY TIME:  16 minutes 24 seconds, 1016.6 mGy  TECHNIQUE: The procedure, risks, benefits, and alternatives were explained to the patient. Questions regarding the procedure were encouraged and answered. The patient understands and consents to the procedure. As antibiotic prophylaxis, cefazolin 2 g was ordered pre-procedure and  administered intravenously within one hour of incision. . A 5 French angiographic catheter was placed as orogastric tube. The upper abdomen was prepped with Betadine, draped in usual sterile fashion, and infiltrated locally with 1% lidocaine. 1 mg glucagon was administered intravenously to facilitate gastric distention and slow peristalsis. Stomach was insufflated using air through the orogastric tube. An 58 French sheath needle was advanced percutaneously into the gastric lumen under fluoroscopy. Gas could be aspirated and a small contrast injection confirmed intraluminal spread. The sheath was exchanged over a guidewire for a 9 [place] vascular sheath, through which the snare device was advanced and used to snare a guidewire passed through the orogastric tube. This was withdrawn, and the snare attached to the 20 French pull-through gastrostomy tube, which was advanced antegrade, positioned with the internal bumper securing the anterior gastric wall to the anterior abdominal wall. Small contrast injection confirms appropriate positioning. The external bumper was applied and the catheter was flushed.  A 5 French angiographic catheter was placed coaxially through the gastrostomy tube and used to direct an angled stiff glidewire across the pylorus, beyond the proximal duodenal perforation, and into the proximal jejunum just beyond the ligament of Treitz. Over this, a coaxial jejunostomy feeding adapter  was advanced. Contrast injection confirmed patency and appropriate position of the gastric and jejunal lumens. No extravasation. The lumens were flushed with saline and capped. The patient tolerated the procedure well.  COMPLICATIONS: COMPLICATIONS none  IMPRESSION: 1. Technically successful 20 French pull-through gastrostomy placement under fluoroscopy. 2. Technically successful coaxial jejunostomy feeding adapter placement to the ligament of Treitz.   Electronically Signed   By: Corlis Leak  Hassell M.D.   On: 08/12/2014 16:12    Ir Catheter Tube Change  09/01/2014   CLINICAL DATA:  History of a duodenal fistula. Surgical drain recently fell out and request for drain replacement.  EXAM: REPLACEMENT OF PERCUTANEOUS DRAIN WITH FLUOROSCOPY  Physician: Rachelle HoraAdam R. Henn, MD  FLUOROSCOPY TIME:  3 minutes, 246 mGy  MEDICATIONS AND MEDICAL HISTORY: None  ANESTHESIA/SEDATION: Moderate sedation time: None  CONTRAST:  15 mL Omnipaque-300  PROCEDURE: The procedure was explained to the patient. The risks and benefits of the procedure were discussed and the patient's questions were addressed. Informed consent was obtained from the patient. The right upper abdomen was prepped and draped in sterile fashion. Maximal barrier sterile technique was utilized including caps, mask, sterile gowns, sterile gloves, sterile drape, hand hygiene and skin antiseptic. A 5 French catheter was advanced through the old drain site. Contrast was injected as the catheter was advanced into the tissue. Contrast injection demonstrated a connection to the duodenum bulb and stomach. Catheter and wire preferentially went into the stomach via the duodenal bulb. A series of wires were used to advance a 12 French [employer] catheter into the abdomen. The tip of the catheter was cut in order to advance the catheter over a wire. The tip was placed adjacent to the duodenal fistula but not within the bowel. Catheter was sutured to the skin. Catheter was attached to gravity bag.  FINDINGS: Focal fistula connection between the drain tract and the duodenal bulb. New 12 French catheter was placed within the fistula tract but outside of the bowel.  Estimated blood loss: Minimal  COMPLICATIONS: None  IMPRESSION: Successful replacement of the percutaneous drain. Demonstration of the duodenal bulb fistula. Drain was placed adjacent to the fistula opening but outside of the bowel.   Electronically Signed   By: Richarda OverlieAdam  Henn M.D.   On: 09/01/2014 13:39   Ir Adele SchilderGastr Tamsen Sniderube Convert Gastr-jej Per  W/fl Mod Sed  08/12/2014   CLINICAL DATA:  Perforated duodenal ulcer, post g patch with persistent leak. Percutaneous gastrojejunostomy as requested for gastric decompression and small bowel enteral feeding support.  EXAM: PERC PLACEMENT GASTROSTOMY;  CONVERT G-TUBE TO G-JTUBE  FLUOROSCOPY TIME:  16 minutes 24 seconds, 1016.6 mGy  TECHNIQUE: The procedure, risks, benefits, and alternatives were explained to the patient. Questions regarding the procedure were encouraged and answered. The patient understands and consents to the procedure. As antibiotic prophylaxis, cefazolin 2 g was ordered pre-procedure and administered intravenously within one hour of incision. . A 5 French angiographic catheter was placed as orogastric tube. The upper abdomen was prepped with Betadine, draped in usual sterile fashion, and infiltrated locally with 1% lidocaine. 1 mg glucagon was administered intravenously to facilitate gastric distention and slow peristalsis. Stomach was insufflated using air through the orogastric tube. An 2018 French sheath needle was advanced percutaneously into the gastric lumen under fluoroscopy. Gas could be aspirated and a small contrast injection confirmed intraluminal spread. The sheath was exchanged over a guidewire for a 9 [place] vascular sheath, through which the snare device was advanced and used to snare a  guidewire passed through the orogastric tube. This was withdrawn, and the snare attached to the 20 French pull-through gastrostomy tube, which was advanced antegrade, positioned with the internal bumper securing the anterior gastric wall to the anterior abdominal wall. Small contrast injection confirms appropriate positioning. The external bumper was applied and the catheter was flushed.  A 5 French angiographic catheter was placed coaxially through the gastrostomy tube and used to direct an angled stiff glidewire across the pylorus, beyond the proximal duodenal perforation, and into the proximal  jejunum just beyond the ligament of Treitz. Over this, a coaxial jejunostomy feeding adapter was advanced. Contrast injection confirmed patency and appropriate position of the gastric and jejunal lumens. No extravasation. The lumens were flushed with saline and capped. The patient tolerated the procedure well.  COMPLICATIONS: COMPLICATIONS none  IMPRESSION: 1. Technically successful 20 French pull-through gastrostomy placement under fluoroscopy. 2. Technically successful coaxial jejunostomy feeding adapter placement to the ligament of Treitz.   Electronically Signed   By: Corlis Leak M.D.   On: 08/12/2014 16:12   Dg Chest Port 1 View  08/23/2014   CLINICAL DATA:  Left chest pain  EXAM: PORTABLE CHEST - 1 VIEW  COMPARISON:  08/18/2014  FINDINGS: Patient is rotated.  Lungs are essentially clear. No focal consolidation. No pleural effusion or pneumothorax.  Cardiomegaly.  Left IJ venous catheter terminates at the cavoatrial junction. Right IJ dual lumen dialysis catheter terminates in the lower SVC.  IMPRESSION: No evidence of acute cardiopulmonary disease.   Electronically Signed   By: Charline Bills M.D.   On: 08/23/2014 19:17   Dg Chest Port 1 View  08/18/2014   CLINICAL DATA:  Shortness of breath at rest.  Having hemodialysis.  EXAM: PORTABLE CHEST - 1 VIEW  COMPARISON:  08/15/2014.  FINDINGS: The cardiac silhouette remains mildly enlarged. Stable right jugular double-lumen catheter. Clear lungs. The pulmonary vasculature remains prominent. No pleural fluid. Minimal right shoulder degenerative changes.  IMPRESSION: Stable cardiomegaly and pulmonary vascular congestion.   Electronically Signed   By: Beckie Salts M.D.   On: 08/18/2014 09:47   Dg Chest Port 1 View  08/15/2014   CLINICAL DATA:  Shortness of breath.  EXAM: PORTABLE CHEST - 1 VIEW  COMPARISON:  08/13/2014 and multiple prior chest radiographs  FINDINGS: Cardiomegaly and pulmonary vascular congestion again noted.  A right IJ central venous  catheter is noted with tips overlying the upper and mid SVC.  A left central venous catheter is present with tip overlying the lower SVC.  There is no evidence of focal airspace disease, pulmonary edema, suspicious pulmonary nodule/mass, pleural effusion, or pneumothorax. No acute bony abnormalities are identified.  IMPRESSION: Cardiomegaly with pulmonary vascular congestion.   Electronically Signed   By: Harmon Pier M.D.   On: 08/15/2014 17:28   Dg Abd Portable 1v  08/23/2014   CLINICAL DATA:  JP drain, broken, initial encounter  EXAM: PORTABLE ABDOMEN - 1 VIEW  COMPARISON:  CT abdomen pelvis dated 08/21/2014  FINDINGS: A portion of the right flank is excluded from this single image.  The patient's right abdominal surgical drain is not visualized.  Gastrojejunostomy in satisfactory position.  IMPRESSION: The patient's right abdominal surgical drain is not visualized.  Gastrojejunostomy in satisfactory position.   Electronically Signed   By: Charline Bills M.D.   On: 08/23/2014 19:11   Ct Image Guided Fluid Drain By Catheter  08/17/2014   CLINICAL DATA:  Postoperative intra-abdominal abscess, subsequent encounter. Small pelvic fluid collection.  EXAM: CT-GUIDED  DRAIN PLACEMENT IN PELVIC FLUID COLLECTION  Physician: Rachelle Hora. Lowella Dandy, MD  FLUOROSCOPY TIME:  None  MEDICATIONS: 50 mcg fentanyl  ANESTHESIA/SEDATION: Patient was monitored by a radiology nurse during the procedure.  PROCEDURE: Informed consent was obtained for drain placement. Patient was placed on her left side. Images through the pelvis were obtained. The right buttock was prepped and draped in sterile fashion. 1% lidocaine was used for a local anesthetic. An 18 gauge needle was directed into the pelvic fluid collection with CT guidance from a transgluteal approach. Thick yellow purulent fluid was aspirated. A stiff Amplatz wire was placed. The tract was dilated to accommodate a 10.2 [place] multipurpose drain. It was technically difficult to advance  the drain into the collection due to the small size of the collection and the patient's body habitus. Eventually, the drain was successfully placed within the collection. 5 mL of yellow purulent fluid was obtained. Catheter was sutured to the skin and attached to a suction bulb. Fluid was sent for culture.  FINDINGS: Small fluid collection just posterior to the uterus. A total of 5 mL of yellow purulent fluid was removed.  Estimated blood loss: Minimal  COMPLICATIONS: None  IMPRESSION: CT-guided placement of a drainage catheter in the small pelvic fluid collection.   Electronically Signed   By: Richarda Overlie M.D.   On: 08/17/2014 17:21        Subjective: Ate a burger today, in good spirits  Objective: Filed Vitals:   09/04/14 0800 09/04/14 0939 09/04/14 1244 09/04/14 1445  BP: 86/58 101/76 89/57   Pulse: 112 120 112   Temp:  97.4 F (36.3 C) 98.3 F (36.8 C)   TempSrc:  Oral Oral   Resp: Height:      Weight:      SpO2:  98% 96% 98%    Intake/Output Summary (Last 24 hours) at 09/04/14 1622 Last data filed at 09/04/14 1227  Gross per 24 hour  Intake 1604.75 ml  Output   3000 ml  Net -1395.25 ml   Weight change: 5.358 kg (11 lb 13 oz) Exam:   General:  Pt is alert, follows commands appropriately, not in acute distress, chronically ill appearing, seen sitting in recliner  HEENT: No icterus, No thrush, no meningismus [place]/AT  Cardiovascular: RRR, S1/S2, no rubs, no gallops  Respiratory: Bibasilar expiratory wheeze. Good air movement.  Abdomen: Soft/+BS, non tender, non distended, no guarding, 2drains noted, L sided drain with mod amount of greenish output and R sided drain without any output  Extremities: trace LE edema, No lymphangitis, No petechiae, No rashes, no synovitis  Data Reviewed: Basic Metabolic Panel:  Recent Labs Lab 08/29/14 0520 09/01/14 1430 09/03/14 0350 09/04/14 0430  NA 140 142 137 135  K 3.5 3.7 4.2 4.3  CL 102 95* 97* 99*  CO2 GLUCOSE 104* 104* 93 92  BUN 32* 43* 37* 19  CREATININE 4.48* 5.72* 4.67* 2.86*  CALCIUM 8.3* 8.2* 8.2* 7.7*  PHOS 4.4  --   --   --    Liver Function Tests:  Recent Labs Lab 08/29/14 0520  ALBUMIN 1.9*   No results for input(s): LIPASE, AMYLASE in the last 168 hours. No results for input(s): AMMONIA in the last 168 hours. CBC:  Recent Labs Lab 08/29/14 2008 08/31/14 0600 09/01/14 1430 09/03/14 0350 09/04/14 0430  WBC 20.5* 11.3* 11.1* 11.5* 9.9  HGB 9.9* 9.1* 9.0* 9.3* 9.0*  HCT 31.0* 29.7* 28.7* 29.7*  28.8*  MCV 93.9 95.8 93.8 94.6 93.8  PLT 229 232 252 208 190   Cardiac Enzymes: No results for input(s): CKTOTAL, CKMB, CKMBINDEX, TROPONINI in the last 168 hours. BNP: Invalid input(s): POCBNP CBG:  Recent Labs Lab 09/01/14 2350  GLUCAP 97    Recent Results (from the past 240 hour(s))  Clostridium Difficile by PCR     Status: None   Collection Time: 08/31/14  2:35 PM  Result Value Ref Range Status   C difficile by pcr NEGATIVE NEGATIVE Final     Scheduled Meds: . sodium chloride   Intravenous Once  . ALPRAZolam  0.5 mg Oral Q T,Th,Sa-HD  . amiodarone  400 mg Oral BID  . antiseptic oral rinse  7 mL Mouth Rinse q12n4p  . aspirin  81 mg Oral Daily  . budesonide (PULMICORT) nebulizer solution  0.25 mg Nebulization BID  . cefTAZidime (FORTAZ)  IV  2 g Intravenous Q T,Th,Sa-HD  . chlorhexidine  15 mL Mouth Rinse BID  . darbepoetin (ARANESP) injection - DIALYSIS  200 mcg Intravenous Q Thu-HD  . feeding supplement (RESOURCE BREEZE)  1 Container Oral TID BM  . feeding supplement (VITAL AF 1.2 CAL)  1,000 mL Per Tube Q24H  . ipratropium  0.5 mg Nebulization TID  . levalbuterol  1.25 mg Nebulization TID  . levothyroxine  25 mcg Oral QAC breakfast  . metoprolol tartrate  12.5 mg Oral BID  . metronidazole  500 mg Intravenous Q8H  . sodium chloride  10-40 mL Intracatheter Q12H   Continuous Infusions: . sodium chloride 10 mL/hr at 08/19/14 0950      Zannie Cove, MD  [facility] Pager [phone]  If 7PM-7AM, please contact night-coverage [email or web address] Password [email or web address] 09/04/2014, 4:22 PM   LOS: 79 days

## 2014-09-04 NOTE — Progress Notes (Addendum)
Physical Therapy Treatment Patient Details Name: [name] MRN: [id number] DOB: 12-06-1955 Today's Date: 09/04/2014    History of Present Illness [name] is a 59 y.o. female with a history of CKD and hypertension came to the [facility] ED on 3/16 complaining of shortness of breath and generalized weakness x 2 weeks. Admitted with acute hypoxemic respiratory failure. During hospital stay noted to have weakness of her left side. 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  She developed septic shock 4/1 with pressors weaned off 4/3.  Began CRRT and pressors restarted 4/4; Developed pelvic abcess 4/7 with pelvic drain placed 4/8.  weaned off pressors 4/9.  Runs of SVT 4/5 and 4/11. Prolonged hospital course with hypoxemia and hypotension. Right abcominal drain accidentally pulled 5/30 and replaced 5/31    PT Comments    Pt states she is feeling poorly but with encouragement and motivation willing to walk, dance and progress mobility. Pt initially stating she couldn't walk today but again able to complete 160' with rest breaks and encouragement. Pt denied HEp at this time due to fatigue. Encouraged OOB throughout the weekend and will continue to follow.   Follow Up Recommendations  CIR;Supervision/Assistance - 24 hour     Equipment Recommendations       Recommendations for Other Services       Precautions / Restrictions Precautions Precautions: Fall Precaution Comments: abdominal drains,  G tube, abdominal binder loosely at all times Other Brace/Splint: abdominal binder    Mobility  Bed Mobility Overal bed mobility: Needs Assistance   Rolling: Supervision Sidelying to sit: Min guard       General bed mobility comments: cues for sequence with assist to scoot hips to EOB and use of rail to roll and elevate trunk  Transfers Overall transfer level: Needs  assistance   Transfers: Sit to/from Stand Sit to Stand: Min assist;+2 physical assistance         General transfer comment: min assist 2 person assist from elevated bed and from recliner x 3 cues for hand placement, sequence and anterior translation  Ambulation/Gait Ambulation/Gait assistance: Min assist;+2 safety/equipment Ambulation Distance (Feet): 50 Feet Assistive device: Rolling walker (2 wheeled) Gait Pattern/deviations: Step-through pattern;Decreased stride length;Trunk flexed;Narrow base of support   Gait velocity interpretation: Below normal speed for age/gender General Gait Details: Pt walked 38', 35' 50', 60' with seated rest between each ambulation trial. Music to assist with motivating pt and chair to follow for fatigue. Cues for posture, position in Rw and BOS   Stairs            Wheelchair Mobility    Modified Rankin (Stroke Patients Only)       Balance Overall balance assessment: Needs assistance   Sitting balance-Leahy Scale: Fair       Standing balance-Leahy Scale: Poor                      Cognition Arousal/Alertness: Awake/alert Behavior During Therapy: WFL for tasks assessed/performed Overall Cognitive Status: Within Functional Limits for tasks assessed                      Exercises      General Comments        Pertinent Vitals/Pain Pain Score: 5  Pain Location: left knee pain Pain Descriptors / Indicators: Aching Pain Intervention(s): Limited activity  within patient's tolerance;Repositioned;Patient requesting pain meds-RN notified  HR 120    Home Living                      Prior Function            PT Goals (current goals can now be found in the care plan section) Acute Rehab PT Goals Time For Goal Achievement: 09/18/14 (pt continues to progress with mobility and goals still appropriate for new date) Progress towards PT goals: Progressing toward goals    Frequency       PT Plan Current  plan remains appropriate    Co-evaluation             End of Session Equipment Utilized During Treatment: Gait belt;Oxygen Activity Tolerance: Patient tolerated treatment well Patient left: in chair;with call bell/phone within reach;with nursing/sitter in room     Time: [id number] PT Time Calculation (min) (ACUTE ONLY): 39 min  Charges:  $Gait Training: 23-37 mins $Therapeutic Activity: 8-22 mins                    G Codes:      Delorse Lekabor, Tishara Pizano Beth 09/04/2014, 10:55 AM Delaney MeigsMaija Tabor [place] Brownstein, PT [phone]

## 2014-09-04 NOTE — Progress Notes (Signed)
CSW will continue to follow for potential SNF placement.  Merlyn LotJenna Holoman, LCSWA Clinical Social Worker [phone]

## 2014-09-04 NOTE — Progress Notes (Addendum)
Nutrition Follow-up  DOCUMENTATION CODES:  Obesity unspecified  INTERVENTION:  Boost Breeze po TID, each supplement provides 250 kcal and 9 grams of protein  Nocturnal TF regimen: Vital AF 1.2 formula at 60 ml/hr x 15 hours (infuse 5 PM to 8 AM) to provide 1080 kcals, 67 gm protein, 730 ml of free water   NUTRITION DIAGNOSIS:  Inadequate oral intake now related to  (limited appetite) as evidenced by meal completion < 50%, ongoing  GOAL:  Patient will meet greater than or equal to 90% of their needs, progressing  MONITOR:  PO intake, TF tolerance, Labs, Weight trends, I & O's  ASSESSMENT: Pt with CKD and hypertension, presented on 3/16 with shortness of breath and generalized weakness x 2 weeks. Pt found to have metabolic acidosis from worsening uremia, H1N1 flu. Pt started on HD. 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  Patient s/p procedure 3/30: EXPLORATORY LAPAROTOMY CLOSURE OF PERFORATED PYLORIC ULCER SMALL BOWEL RESECTION  Patient s/p procedure 5/11: G-J TUBE PLACEMENT -- tip past ligament of Treitz  Pt walking with PT at time of visit.  Advanced to a Soft diet this AM.  Noted ate some ice cream and OJ (~300 kcals, 4 gm protein).  PO intake 0-25% per flowsheet records.  Vital AF 1.2 formula currently infusing via J-port at 55 ml/hr providing 1584 kcals, 99 gm protein, 1071 ml of free water.  Spoke with NP regarding nutrition care plan.  Telephone with readback orders received.  Height:  Ht Readings from Last 1 Encounters:  08/02/14 5\' 6"  (1.676 m)    Weight:  Wt Readings from Last 1 Encounters:  09/04/14 206 lb 11.2 oz (93.759 kg)    Ideal Body Weight:  59.1 kg  Wt Readings from Last 10 Encounters:  09/04/14 206 lb 11.2 oz (93.759 kg)  06/01/14 225 lb (102.059 kg)  01/28/14 230 lb (104.327 kg)  01/21/14 231 lb 1.6 oz (104.826 kg)  10/02/12 216 lb 8 oz (98.204 kg)    BMI:  Body mass  index is 33.38 kg/(m^2).  Estimated Nutritional Needs:  Kcal:  2100-2300  Protein:  120-140 gm  Fluid:  per MD  Skin:  Wound (see comment) (stage 2 pressure ulcer to sacrum)  Diet Order:  DIET SOFT Room service appropriate?: Yes; Fluid consistency:: Thin  EDUCATION NEEDS:  Education needs no appropriate at this time   Intake/Output Summary (Last 24 hours) at 09/04/14 1121 Last data filed at 09/04/14 1050  Gross per 24 hour  Intake   1690 ml  Output   3000 ml  Net  -1310 ml    Last BM:  6/2  Maureen ChattersKatie Ashton Sabine, RD, LDN Pager #: [phone] After-Hours Pager #: [phone]

## 2014-09-04 NOTE — Progress Notes (Signed)
Patient states that she does not wear a CPAP and is refusing one tonight.

## 2014-09-05 DIAGNOSIS — R1084 Generalized abdominal pain: Secondary | ICD-10-CM

## 2014-09-05 LAB — CBC
HCT: 29.4 % — ABNORMAL LOW (ref 36.0–46.0)
Hemoglobin: 9.2 g/dL — ABNORMAL LOW (ref 12.0–15.0)
MCH: 29 pg (ref 26.0–34.0)
MCHC: 31.3 g/dL (ref 30.0–36.0)
MCV: 92.7 fL (ref 78.0–100.0)
Platelets: 253 10*3/uL (ref 150–400)
RBC: 3.17 MIL/uL — ABNORMAL LOW (ref 3.87–5.11)
RDW: 21.2 % — AB (ref 11.5–15.5)
WBC: 11.1 10*3/uL — AB (ref 4.0–10.5)

## 2014-09-05 LAB — BASIC METABOLIC PANEL
Anion gap: 15 (ref 5–15)
BUN: 33 mg/dL — AB (ref 6–20)
CO2: 26 mmol/L (ref 22–32)
Calcium: 8.4 mg/dL — ABNORMAL LOW (ref 8.9–10.3)
Chloride: 94 mmol/L — ABNORMAL LOW (ref 101–111)
Creatinine, Ser: 4.32 mg/dL — ABNORMAL HIGH (ref 0.44–1.00)
GFR calc Af Amer: 12 mL/min — ABNORMAL LOW (ref >60)
GFR calc non Af Amer: 10 mL/min — ABNORMAL LOW (ref >60)
Glucose, Bld: 97 mg/dL (ref 65–99)
Potassium: 4.5 mmol/L (ref 3.5–5.1)
Sodium: 135 mmol/L (ref 135–145)

## 2014-09-05 NOTE — Progress Notes (Signed)
PROGRESS NOTE  [name] ZOX:[id number] DOB: [birth year] DOA: 06/17/2014 PCP: [name], MD  Brief Narrative: 59 yo ? smoker with hx HTN, COPD, CKD IV followed @ [facility]-[name] Page, failed L AV fistula, initially admitted 3/16 with flu and acute hypoxic resp failure. Ultimately tx to [facility] due to need for HD. L sided weakness 3/18 and found to have R ACA stroke-showed acute infarct R ACA (3/19). Patient more lethargic 3/30 and CT abd=perf viscus 3/30. She was taken to the OR for ex lap for perforated pyloric ulcer with diffuse peritonitis, ischemic necrosis of mid ileum. Patient developed septic shock on 4-1, she was started on pressors and wean off pressors on 4-3. Subsequently, she was diagnosed with pelvic abscess by CT scan perform on 4-7. She underwent pelvic drain placement by IR on 4/8= culture neg 08/15/14 another drained placed in the pelvic cul-de-sac abscess with culture = pseudomonas. On 08/20/14, Gluteal drain was inadvertently pulled out. Repeat CT scan shows improvement in the fluid collection so decision was to leave it out and continue IV abx. Pt hospital stay also complicated by high output duodenal (enterocutaneous fistula) fistula. As a result, pt was made npo and started on TPN. Now stable, on TNA +high output from fistula/leak per [facility]. Had a G_J tube placed by Dr. [name] of IR and Gen surgery has continued to assist with management of her multiple surgical comorbidities C. diff diagnosed 4-12-initially unresponsive to flagyl, and she was started on PO vancomycin 4-16 which she finished 3 week.  5/27--TPN weaned off and increasing TF.  06/24/14 Develops run of SVT, atrial tachycardia. Cardiology was helping with management. Course complicated with high RV pressure and Cor Pulmonale; 4/27 CT angio negative for PE. On 5/2 she developed hypoxemia, hypotension and SVT during dialysis treatment. Dialysis was stopped. She received IV bolus. She was started on BIPAP.  She was transferred to Step down unit. PCCM and cardiology re consulted. Her Hr decreased, BP improved. She was taken off BIPAP. This recurred on 5/15 with episodic SVT. Since then she has had intermitten short episodes of SVT, but on 08/28/14 she has had more sustained SVT, therefore; cardiology was reconsulted on on 08/29/14.  Assessment/Plan: Status post exploratory laparotomy with closure of perforated pyloric ulcer/abdominal abscess / small bowel resection w/ postop ileus and now with enterocutaneous fistula. Pelvic abscess s/p percutaneous drain placement  -completed, long courses of IV  Abx including Vanc and FLuconazole -Due to Fistula, she was made NPO, TPN was started -Fistula study done shows JP drain connection to the duodenal bulb-5-02. - 08/15/2014 repeat CT abdomen with 3.0 x 3.5 x 5.7 cm collection in the pelvic cul-de-sac and extraluminal air alongside JP drain in anterior abd wall -had high output from Fistula/leak. Had G_J placed by IR 08/12/14 at request of gen surgery -CT repeat shows 5 cm post cul-de-sac abscess which was drained on 5/16-culture revealed pseudomonas aeruginosa -ID was consulted as patient had Pseudomonas growing from her culture wounds and they started back Flagyl as well as Elita Quick on 5/16  -Per ID recommendation--plan D#19/21 days of Elita Quick and D#19/ 28 days metronidazole -Patient inadvertently pulled out her gluteal/cul-de-sac drain May 19. Seen by interventional radiology and they recommended repeat CT scan, which was done. Shows that the fluid collection has decreased in size. They do not plan to replace the drain at this time. Continue with antibiotics alone.  -08/21/2014 CT scan did suggest a transmural defect in the  stomach wall near the patient's pyloric Cheree Ditto patch. -Patient accidentally pulled out her JP drain on 5/22. This has been replaced by surgery on 5/23. -Octreotide was discontinued 5/23--enterocutaneous fistula output appears to be decreasing  gradually -finished TPN on 08/27/14 -Enteral Feeding was increased 5/27 to 55 mL per hour--tolerating without residuals -08/29/14--started clear liquids-->tolerating well -08/30/14--increased WBC-->check Cdiff--neg -08/31/14--pt inadvertently pulled out R-side JP drain again (3rd time) during therapy + GJ tube with bloody drainage--contacted general surgery 09/01/14--IR replaced RUQ drain (entercutaneous fistula)--reminded pt to wear abd binder at all times; no output from this drain 6/1: started full liquids  6/3: started regular diet: ATE a Hamburger!!-Huge milestone as far as patient is concerned  PSVT --Atrial tachycardia -Felt to be secondary to fluid metabolic shifts from ESRD and ongoing abd issues; also has severe anxiety with dialysis as well as COPD/Cor Pulmonale  -was on IV metoprolol and amiodarone, now 5/31 switched to PO  -CTA 4/27, negative for PE  -Cards following  ESRD new, now on HD this admit complicated by intermittent hypotension AKI on CKD 4, due to sepsis, shock Ongoing hemodialysis per Nephrology - perm-cath placed 3/26 d/t AVF problems Patient AV fistula reviewed and not mature yet per Dr. Paulene Floor need to revisit prior to d/c -TPN weaned off 08/27/14 -Continue hemodialysis Tuesday, Thursday, Saturday while the patient is in the hospital -Xanax with each HD due to significant anxiety with HD -starting to sit up with HD now, BPs low will not be able to tolerate much volume removal  C. difficile colitis still on contact precautions -C diff positive on 4-12, Received 4 days of flagyl without improvement, then started on PO vanc -Completed 3 weeks of Oral Vanc-stopped 5/7, this was 1 week after IV Zosyn was stopped 5/5 -5/28--having loose stools again-->Cdiff PCR negative -keep on contact precautions  ACA CVA with Lt sided weakness -06/23/14--TEE ;normal LV function; no LAA thrombus -Started on ASA 81mg  per Neuro; resumed this -Will need ongoing long term  rehab - PT/OT to cont to follow while inpatient -Patient remains very weak and deconditioned. 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 Fluids/volume managed with hemodialysis.  Elevated PA pressures on 2-D echo; CTA negative for PE - 07/23/2014 echo-- EF 60-61%, grade 1 diastolic dysfunction, Severe RV dilatation, PAP 90  Small PFO Has been evaluated by Cardiology. No intervention planned currently.  Anemia of critical illness/chronic disease -and bleeding from leak/fistula which has resolved. -s/p 2U PRBC 4/13 and 5/10 and 5/26 -Aranesp/iron per renal -transfuse for hemoglobin less than 7  Acute hypoxic Respiratory Failure:  -This was secondary to H1N1, hypervolemia, HCAP >> resolved.  -also has COPD and Cor Pulmonale - now on scheduled Xopenex and Atrovent, change to PRN  - presently stable on 2 L-3L  Thrombocytopenia; resolved.  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 TSH in 7 range -continue low dose synthroid given PSVT, changed to PO synthroid  L Renal mass  -noted on [place], Indeterminate 1.9 cm hypoechoic mass off the interpolar region of the left kidney -  may potentially represent a cyst however solid mass is not excluded -needs outpatient FU for this  Fall on May 18  DVT prophylaxis: SCDs Code Status: FULL Family Communication: Discussed with patient. No family at bedside. Disposition Plan: Have been unable to transfer patient out of stepdown unit due to dyspnea  and tachycardia. She also has complex wound care needs. Dialysis is ongoing. Not ready for discharge. Not accepted to [facility]  Consultants: Cardiology Nephrology PCCM Gen Surgery  Palliative care    Procedures/Studies: Ct Abdomen Pelvis Wo Contrast  08/22/2014   CLINICAL DATA:  Abdominal abscess. No intravenous contrast due to renal failure.  EXAM: CT ABDOMEN AND PELVIS WITHOUT CONTRAST  TECHNIQUE: Multidetector CT imaging of the abdomen and pelvis was performed following the standard protocol without IV contrast.  COMPARISON:  08/15/2014  FINDINGS: BODY WALL: Open laparotomy without fluid collection.  LOWER CHEST: Chronic triangular opacity in the posterior costophrenic sulcus on the left, atelectasis versus scarring  ABDOMEN/PELVIS:  Liver: No focal abnormality.  Biliary: High-density material within the gallbladder showing mild wall thickening which is chronic.  Pancreas: Unremarkable.  Spleen: Unremarkable.  Adrenals: Unremarkable.  Kidneys and ureters: No hydronephrosis or stone. Presumed 1 cm cyst from the interpolar left kidney  Bladder: Decompressed.  No pathologic findings.  Reproductive: Calcified and noncalcified uterine fibroids, better seen on previous enhanced imaging.  Bowel: Right upper quadrant drain related to perforated pyloric ulcer status post Cheree Ditto patch. Specially visible on coronal imaging, there is a gas channel through the pylorus measuring 6 to 9 mm in diameter. No bowel obstruction. Enteroenterostomy noted in the pelvis. Oral contrast again seen in the colon. No notable colonic wall thickening in this patient with history of C difficile. Negative appendix. Percutaneous gastrojejunostomy tube is in good position.  Retroperitoneum: No mass or adenopathy.  Peritoneum: Known thick walled collection in the rectovaginal recess smaller than 08/15/2014 at 40 x 16 mm as compared to 51 x 32 mm. No new collection is identified. No free pneumoperitoneum.  Vascular: 3 cm fusiform  infrarenal aortic aneurysm.  OSSEOUS: No acute abnormalities.  IMPRESSION: 1. Near the patient's pyloric Cheree Ditto patch is a transmural defect in the stomach wall, contiguous with the surgical drain. If repeat surgery is considered, oral contrast could confirm an open ulcer. 2. Decreased rectovaginal recess fluid collection, now 4 x 1.5 cm (previously 5 x 3 cm).   Electronically Signed   By: Marnee Spring M.D.   On: 08/22/2014 01:49   Ct Abdomen Pelvis Wo Contrast  08/15/2014   CLINICAL DATA:  Intra abdominal abscess.  EXAM: CT ABDOMEN AND PELVIS WITHOUT CONTRAST  TECHNIQUE: Multidetector CT imaging of the abdomen and pelvis was performed following the standard protocol without IV contrast.  COMPARISON:  CT scan of Aug 03, 2014.  FINDINGS: Severe degenerative disc disease is noted at L5-S1. Minimal subsegmental atelectasis is noted posteriorly in the left lung base.  No gallstones are noted. No focal abnormality is noted in the liver, spleen or pancreas on these unenhanced images. Adrenal glands appear normal. Stable exophytic cyst is seen arising from midpole of left kidney. No hydronephrosis or renal obstruction is noted. No renal or ureteral calculi are noted. 3 cm infrarenal abdominal aortic aneurysm is noted. Gastrojejunostomy tube is seen entering stomach percutaneously and with distal tip in the jejunum. JP surgical drain is seen entering right upper quadrant of abdomen underneath the liver with distal tip in the left upper quadrant. No significant fluid collection is noted around the drain. Fluid  collection is again identified and posterior cul-de-sac of pelvis measuring 5.1 x 3.2 cm. Calcified degenerating fibroid is noted in the uterus. Urinary bladder is unremarkable. There is no evidence of bowel obstruction. The appendix appears normal. No significant adenopathy is noted. Large midline surgical anterior abdominal wall incision is noted.  IMPRESSION: Stable 3 cm infrarenal abdominal aortic aneurysm.   No change in position of JP surgical drain with distal tip in the left upper quadrant of the abdomen.  Interval placement of percutaneous gastrojejunostomy tube with distal tip in the jejunum.  Grossly stable size and appearance of 5 cm fluid collection seen in posterior cul-de-sac of the pelvis.  Continued presence uterine fibroids.   Electronically Signed   By: Lupita Raider, M.D.   On: 08/15/2014 15:15   Dg Chest 1 View  08/13/2014   CLINICAL DATA:  Shortness of breath  EXAM: CHEST  1 VIEW  COMPARISON:  08/03/2014  FINDINGS: Moderately severe cardiac enlargement. Central line in unchanged position on the right and left side. Mild vascular congestion with no evidence of edema effusion or consolidation.  IMPRESSION: No acute findings   Electronically Signed   By: Esperanza Heir M.D.   On: 08/13/2014 11:04   Dg Chest 2 View  09/01/2014   CLINICAL DATA:  Followup respiratory failure  EXAM: CHEST  2 VIEW  COMPARISON:  08/23/2014  FINDINGS: Moderate cardiac enlargement. Left IJ catheter is identified with tip in the SVC. There is a right-sided dialysis catheter with tips in the SVC. No pleural effusion or edema. No airspace consolidation.  IMPRESSION: 1. Lungs remain clear. 2. Catheters positioned as above.   Electronically Signed   By: Signa Kell M.D.   On: 09/01/2014 09:04   Ir Gastrostomy Tube Mod Sed  08/12/2014   CLINICAL DATA:  Perforated duodenal ulcer, post g patch with persistent leak. Percutaneous gastrojejunostomy as requested for gastric decompression and small bowel enteral feeding support.  EXAM: PERC PLACEMENT GASTROSTOMY;  CONVERT G-TUBE TO G-JTUBE  FLUOROSCOPY TIME:  16 minutes 24 seconds, 1016.6 mGy  TECHNIQUE: The procedure, risks, benefits, and alternatives were explained to the patient. Questions regarding the procedure were encouraged and answered. The patient understands and consents to the procedure. As antibiotic prophylaxis, cefazolin 2 g was ordered pre-procedure and  administered intravenously within one hour of incision. . A 5 French angiographic catheter was placed as orogastric tube. The upper abdomen was prepped with Betadine, draped in usual sterile fashion, and infiltrated locally with 1% lidocaine. 1 mg glucagon was administered intravenously to facilitate gastric distention and slow peristalsis. Stomach was insufflated using air through the orogastric tube. An 45 French sheath needle was advanced percutaneously into the gastric lumen under fluoroscopy. Gas could be aspirated and a small contrast injection confirmed intraluminal spread. The sheath was exchanged over a guidewire for a 9 [place] vascular sheath, through which the snare device was advanced and used to snare a guidewire passed through the orogastric tube. This was withdrawn, and the snare attached to the 20 French pull-through gastrostomy tube, which was advanced antegrade, positioned with the internal bumper securing the anterior gastric wall to the anterior abdominal wall. Small contrast injection confirms appropriate positioning. The external bumper was applied and the catheter was flushed.  A 5 French angiographic catheter was placed coaxially through the gastrostomy tube and used to direct an angled stiff glidewire across the pylorus, beyond the proximal duodenal perforation, and into the proximal jejunum just beyond the ligament of Treitz. Over this, a coaxial  jejunostomy feeding adapter was advanced. Contrast injection confirmed patency and appropriate position of the gastric and jejunal lumens. No extravasation. The lumens were flushed with saline and capped. The patient tolerated the procedure well.  COMPLICATIONS: COMPLICATIONS none  IMPRESSION: 1. Technically successful 20 French pull-through gastrostomy placement under fluoroscopy. 2. Technically successful coaxial jejunostomy feeding adapter placement to the ligament of Treitz.   Electronically Signed   By: Corlis Leak  Hassell M.D.   On: 08/12/2014 16:12    Ir Catheter Tube Change  09/01/2014   CLINICAL DATA:  History of a duodenal fistula. Surgical drain recently fell out and request for drain replacement.  EXAM: REPLACEMENT OF PERCUTANEOUS DRAIN WITH FLUOROSCOPY  Physician: Rachelle HoraAdam R. Henn, MD  FLUOROSCOPY TIME:  3 minutes, 246 mGy  MEDICATIONS AND MEDICAL HISTORY: None  ANESTHESIA/SEDATION: Moderate sedation time: None  CONTRAST:  15 mL Omnipaque-300  PROCEDURE: The procedure was explained to the patient. The risks and benefits of the procedure were discussed and the patient's questions were addressed. Informed consent was obtained from the patient. The right upper abdomen was prepped and draped in sterile fashion. Maximal barrier sterile technique was utilized including caps, mask, sterile gowns, sterile gloves, sterile drape, hand hygiene and skin antiseptic. A 5 French catheter was advanced through the old drain site. Contrast was injected as the catheter was advanced into the tissue. Contrast injection demonstrated a connection to the duodenum bulb and stomach. Catheter and wire preferentially went into the stomach via the duodenal bulb. A series of wires were used to advance a 12 French [employer] catheter into the abdomen. The tip of the catheter was cut in order to advance the catheter over a wire. The tip was placed adjacent to the duodenal fistula but not within the bowel. Catheter was sutured to the skin. Catheter was attached to gravity bag.  FINDINGS: Focal fistula connection between the drain tract and the duodenal bulb. New 12 French catheter was placed within the fistula tract but outside of the bowel.  Estimated blood loss: Minimal  COMPLICATIONS: None  IMPRESSION: Successful replacement of the percutaneous drain. Demonstration of the duodenal bulb fistula. Drain was placed adjacent to the fistula opening but outside of the bowel.   Electronically Signed   By: Richarda OverlieAdam  Henn M.D.   On: 09/01/2014 13:39   Ir Adele SchilderGastr Tamsen Sniderube Convert Gastr-jej Per  W/fl Mod Sed  08/12/2014   CLINICAL DATA:  Perforated duodenal ulcer, post g patch with persistent leak. Percutaneous gastrojejunostomy as requested for gastric decompression and small bowel enteral feeding support.  EXAM: PERC PLACEMENT GASTROSTOMY;  CONVERT G-TUBE TO G-JTUBE  FLUOROSCOPY TIME:  16 minutes 24 seconds, 1016.6 mGy  TECHNIQUE: The procedure, risks, benefits, and alternatives were explained to the patient. Questions regarding the procedure were encouraged and answered. The patient understands and consents to the procedure. As antibiotic prophylaxis, cefazolin 2 g was ordered pre-procedure and administered intravenously within one hour of incision. . A 5 French angiographic catheter was placed as orogastric tube. The upper abdomen was prepped with Betadine, draped in usual sterile fashion, and infiltrated locally with 1% lidocaine. 1 mg glucagon was administered intravenously to facilitate gastric distention and slow peristalsis. Stomach was insufflated using air through the orogastric tube. An 3918 French sheath needle was advanced percutaneously into the gastric lumen under fluoroscopy. Gas could be aspirated and a small contrast injection confirmed intraluminal spread. The sheath was exchanged over a guidewire for a 9 [place] vascular sheath, through which the snare device was advanced and used  to snare a guidewire passed through the orogastric tube. This was withdrawn, and the snare attached to the 20 French pull-through gastrostomy tube, which was advanced antegrade, positioned with the internal bumper securing the anterior gastric wall to the anterior abdominal wall. Small contrast injection confirms appropriate positioning. The external bumper was applied and the catheter was flushed.  A 5 French angiographic catheter was placed coaxially through the gastrostomy tube and used to direct an angled stiff glidewire across the pylorus, beyond the proximal duodenal perforation, and into the proximal  jejunum just beyond the ligament of Treitz. Over this, a coaxial jejunostomy feeding adapter was advanced. Contrast injection confirmed patency and appropriate position of the gastric and jejunal lumens. No extravasation. The lumens were flushed with saline and capped. The patient tolerated the procedure well.  COMPLICATIONS: COMPLICATIONS none  IMPRESSION: 1. Technically successful 20 French pull-through gastrostomy placement under fluoroscopy. 2. Technically successful coaxial jejunostomy feeding adapter placement to the ligament of Treitz.   Electronically Signed   By: Corlis Leak M.D.   On: 08/12/2014 16:12   Dg Chest Port 1 View  08/23/2014   CLINICAL DATA:  Left chest pain  EXAM: PORTABLE CHEST - 1 VIEW  COMPARISON:  08/18/2014  FINDINGS: Patient is rotated.  Lungs are essentially clear. No focal consolidation. No pleural effusion or pneumothorax.  Cardiomegaly.  Left IJ venous catheter terminates at the cavoatrial junction. Right IJ dual lumen dialysis catheter terminates in the lower SVC.  IMPRESSION: No evidence of acute cardiopulmonary disease.   Electronically Signed   By: Charline Bills M.D.   On: 08/23/2014 19:17   Dg Chest Port 1 View  08/18/2014   CLINICAL DATA:  Shortness of breath at rest.  Having hemodialysis.  EXAM: PORTABLE CHEST - 1 VIEW  COMPARISON:  08/15/2014.  FINDINGS: The cardiac silhouette remains mildly enlarged. Stable right jugular double-lumen catheter. Clear lungs. The pulmonary vasculature remains prominent. No pleural fluid. Minimal right shoulder degenerative changes.  IMPRESSION: Stable cardiomegaly and pulmonary vascular congestion.   Electronically Signed   By: Beckie Salts M.D.   On: 08/18/2014 09:47   Dg Chest Port 1 View  08/15/2014   CLINICAL DATA:  Shortness of breath.  EXAM: PORTABLE CHEST - 1 VIEW  COMPARISON:  08/13/2014 and multiple prior chest radiographs  FINDINGS: Cardiomegaly and pulmonary vascular congestion again noted.  A right IJ central venous  catheter is noted with tips overlying the upper and mid SVC.  A left central venous catheter is present with tip overlying the lower SVC.  There is no evidence of focal airspace disease, pulmonary edema, suspicious pulmonary nodule/mass, pleural effusion, or pneumothorax. No acute bony abnormalities are identified.  IMPRESSION: Cardiomegaly with pulmonary vascular congestion.   Electronically Signed   By: Harmon Pier M.D.   On: 08/15/2014 17:28   Dg Abd Portable 1v  08/23/2014   CLINICAL DATA:  JP drain, broken, initial encounter  EXAM: PORTABLE ABDOMEN - 1 VIEW  COMPARISON:  CT abdomen pelvis dated 08/21/2014  FINDINGS: A portion of the right flank is excluded from this single image.  The patient's right abdominal surgical drain is not visualized.  Gastrojejunostomy in satisfactory position.  IMPRESSION: The patient's right abdominal surgical drain is not visualized.  Gastrojejunostomy in satisfactory position.   Electronically Signed   By: Charline Bills M.D.   On: 08/23/2014 19:11   Ct Image Guided Fluid Drain By Catheter  08/17/2014   CLINICAL DATA:  Postoperative intra-abdominal abscess, subsequent encounter. Small pelvic fluid collection.  EXAM: CT-GUIDED DRAIN PLACEMENT IN PELVIC FLUID COLLECTION  Physician: Rachelle Hora. Lowella Dandy, MD  FLUOROSCOPY TIME:  None  MEDICATIONS: 50 mcg fentanyl  ANESTHESIA/SEDATION: Patient was monitored by a radiology nurse during the procedure.  PROCEDURE: Informed consent was obtained for drain placement. Patient was placed on her left side. Images through the pelvis were obtained. The right buttock was prepped and draped in sterile fashion. 1% lidocaine was used for a local anesthetic. An 18 gauge needle was directed into the pelvic fluid collection with CT guidance from a transgluteal approach. Thick yellow purulent fluid was aspirated. A stiff Amplatz wire was placed. The tract was dilated to accommodate a 10.2 [place] multipurpose drain. It was technically difficult to advance  the drain into the collection due to the small size of the collection and the patient's body habitus. Eventually, the drain was successfully placed within the collection. 5 mL of yellow purulent fluid was obtained. Catheter was sutured to the skin and attached to a suction bulb. Fluid was sent for culture.  FINDINGS: Small fluid collection just posterior to the uterus. A total of 5 mL of yellow purulent fluid was removed.  Estimated blood loss: Minimal  COMPLICATIONS: None  IMPRESSION: CT-guided placement of a drainage catheter in the small pelvic fluid collection.   Electronically Signed   By: Richarda Overlie M.D.   On: 08/17/2014 17:21        Subjective: Ate pancakes for breakfast, some dyspnea  Objective: Filed Vitals:   09/05/14 0739 09/05/14 0803 09/05/14 1228 09/05/14 1313  BP:  91/52 83/57   Pulse:  118 104   Temp:  97.4 F (36.3 C) 98.2 F (36.8 C)   TempSrc:  Oral Oral   Resp:  24 21   Height:      Weight:      SpO2: 96% 96% 97% 96%    Intake/Output Summary (Last 24 hours) at 09/05/14 1358 Last data filed at 09/05/14 1250  Gross per 24 hour  Intake   1820 ml  Output    550 ml  Net   1270 ml   Weight change: 1.241 kg (2 lb 11.8 oz) Exam:   General:  Pt is alert, follows commands appropriately, not in acute distress, chronically ill appearing, seen sitting in recliner  HEENT: No icterus, No thrush, no meningismus [place]/AT  Cardiovascular: RRR, S1/S2, no rubs, no gallops  Respiratory: basilar ronchi, Good air movement.  Abdomen: Soft/+BS, non tender, non distended, no guarding, 2drains noted, L sided drain with mod amount of greenish output and R sided drain without any output  Extremities: trace LE edema, No lymphangitis, No petechiae, No rashes, no synovitis  Data Reviewed: Basic Metabolic Panel:  Recent Labs Lab 09/01/14 1430 09/03/14 0350 09/04/14 0430 09/05/14 0532  NA 142 137 135 135  K 3.7 4.2 4.3 4.5  CL 95* 97* 99* 94*  CO2 GLUCOSE 104*  93 92 97  BUN 43* 37* 19 33*  CREATININE 5.72* 4.67* 2.86* 4.32*  CALCIUM 8.2* 8.2* 7.7* 8.4*   Liver Function Tests: No results for input(s): AST, ALT, ALKPHOS, BILITOT, PROT, ALBUMIN in the last 168 hours. No results for input(s): LIPASE, AMYLASE in the last 168 hours. No results for input(s): AMMONIA in the last 168 hours. CBC:  Recent Labs Lab 08/31/14 0600 09/01/14 1430 09/03/14 0350 09/04/14 0430 09/05/14 0532  WBC 11.3* 11.1* 11.5* 9.9 11.1*  HGB 9.1* 9.0* 9.3* 9.0* 9.2*  HCT 29.7* 28.7* 29.7* 28.8* 29.4*  MCV  95.8 93.8 94.6 93.8 92.7  PLT 232 252 208 190 253   Cardiac Enzymes: No results for input(s): CKTOTAL, CKMB, CKMBINDEX, TROPONINI in the last 168 hours. BNP: Invalid input(s): POCBNP CBG:  Recent Labs Lab 09/01/14 2350  GLUCAP 97    Recent Results (from the past 240 hour(s))  Clostridium Difficile by PCR     Status: None   Collection Time: 08/31/14  2:35 PM  Result Value Ref Range Status   C difficile by pcr NEGATIVE NEGATIVE Final     Scheduled Meds: . sodium chloride   Intravenous Once  . ALPRAZolam  0.5 mg Oral Q T,Th,Sa-HD  . amiodarone  400 mg Oral BID  . antiseptic oral rinse  7 mL Mouth Rinse q12n4p  . aspirin  81 mg Oral Daily  . budesonide (PULMICORT) nebulizer solution  0.25 mg Nebulization BID  . cefTAZidime (FORTAZ)  IV  2 g Intravenous Q T,Th,Sa-HD  . chlorhexidine  15 mL Mouth Rinse BID  . darbepoetin (ARANESP) injection - DIALYSIS  200 mcg Intravenous Q Thu-HD  . feeding supplement (RESOURCE BREEZE)  1 Container Oral TID BM  . feeding supplement (VITAL AF 1.2 CAL)  1,000 mL Per Tube Q24H  . ipratropium  0.5 mg Nebulization TID  . levothyroxine  25 mcg Oral QAC breakfast  . metoprolol tartrate  12.5 mg Oral BID  . metronidazole  500 mg Intravenous Q8H  . sodium chloride  10-40 mL Intracatheter Q12H   Continuous Infusions: . sodium chloride 10 mL/hr at 08/19/14 0950     Zannie Cove, MD  [facility] Pager  [phone]  If 7PM-7AM, please contact night-coverage [email or web address] Password [email or web address] 09/05/2014, 1:58 PM   LOS: 80 days

## 2014-09-05 NOTE — Progress Notes (Signed)
Patient ID: Yumalay C Michelini, female   DOB: 01-03-1956, 59 y.o.   MRN: [id number] Patient ID: Laurielle C Reddick, female   DOB: 1955-09-16, 59 y.o.   MRN: [id number]     [facility]      [address] [place], [place] [address]    Phone: [phone] FAX: [phone]     Subjective: Sore. No n/w.  Ate ice cream and orange juice.   No output from drain.    Objective:  Vital signs:  Filed Vitals:   09/04/14 2335 09/05/14 0500 09/05/14 0502 09/05/14 0739  BP: 94/69  92/61   Pulse: 113  110   Temp: 98.3 F (36.8 C)  98.5 F (36.9 C)   TempSrc: Oral  Axillary   Resp: 25  19   Height:      Weight:  95 kg (209 lb 7 oz)    SpO2: 96%  99% 96%    Last BM Date: 09/04/14  Intake/Output   Yesterday:  06/03 0701 - 06/04 0700 In: 1599.8 [P.O.:240; I.V.:100; NG/GT:959.8; IV Piggyback:300] Out: 550 [Urine:50; Drains:500] This shift:     Physical Exam: General: Pt awake/alert/oriented x4 in n9o acute distress Abdomen: Soft. Nondistended. Mildly tender around wound. Wound is c/d/i. G tube clamped. J tube with TF. No output in perc drain. No evidence of peritonitis. No incarcerated hernias.   Problem List:   Principal Problem:   Acute respiratory failure with hypoxia Active Problems:   Cerebral thrombosis with cerebral infarction   Hypertension   ESRD needing dialysis   Obesity (BMI 30-39.9)   Depression   Left renal mass   Chronic diastolic heart failure   P8K9 influenza   Tobacco use disorder   Renal failure (ARF), acute on chronic   Hyperlipidemia   PSVT (paroxysmal supraventricular tachycardia)   SOB (shortness of breath)   Perforated gastric ulcer   Abnormal TSH   History of intermediate V/Q scan   HIT (heparin-induced thrombocytopenia)   Fistula   Abdominal pain   Palliative care encounter   ESRD (end stage renal disease)   SVT (supraventricular tachycardia)   Right heart failure   Duodenal anastomotic leak    Protein calorie malnutrition   Itching   Aortic aneurysm   Protein-calorie malnutrition   Sepsis   Duodenal fistula    Results:   Labs: Results for orders placed or performed during the hospital encounter of 06/17/14 (from the past 48 hour(s))  Basic metabolic panel     Status: Abnormal   Collection Time: 09/04/14  4:30 AM  Result Value Ref Range   Sodium 135 135 - 145 mmol/L   Potassium 4.3 3.5 - 5.1 mmol/L   Chloride 99 (L) 101 - 111 mmol/L   CO2 27 22 - 32 mmol/L   Glucose, Bld 92 65 - 99 mg/dL   BUN 19 6 - 20 mg/dL   Creatinine, Ser 2.86 (H) 0.44 - 1.00 mg/dL    Comment: DELTA CHECK NOTED   Calcium 7.7 (L) 8.9 - 10.3 mg/dL   GFR calc non Af Amer 17 (L) >60 mL/min   GFR calc Af Amer 20 (L) >60 mL/min    Comment: (NOTE) The eGFR has been calculated using the CKD EPI equation. This calculation has not been validated in all clinical situations. eGFR's persistently <60 mL/min signify possible Chronic Kidney Disease.    Anion gap 9 5 - 15  CBC     Status: Abnormal   Collection Time: 09/04/14  4:30 AM  Result Value Ref Range   WBC 9.9 4.0 - 10.5 K/uL   RBC 3.07 (L) 3.87 - 5.11 MIL/uL   Hemoglobin 9.0 (L) 12.0 - 15.0 g/dL   HCT 28.8 (L) 36.0 - 46.0 %   MCV 93.8 78.0 - 100.0 fL   MCH 29.3 26.0 - 34.0 pg   MCHC 31.3 30.0 - 36.0 g/dL   RDW 21.9 (H) 11.5 - 15.5 %   Platelets 190 150 - 400 K/uL  CBC     Status: Abnormal   Collection Time: 09/05/14  5:32 AM  Result Value Ref Range   WBC 11.1 (H) 4.0 - 10.5 K/uL   RBC 3.17 (L) 3.87 - 5.11 MIL/uL   Hemoglobin 9.2 (L) 12.0 - 15.0 g/dL   HCT 29.4 (L) 36.0 - 46.0 %   MCV 92.7 78.0 - 100.0 fL   MCH 29.0 26.0 - 34.0 pg   MCHC 31.3 30.0 - 36.0 g/dL   RDW 21.2 (H) 11.5 - 15.5 %   Platelets 253 150 - 400 K/uL  Basic metabolic panel     Status: Abnormal   Collection Time: 09/05/14  5:32 AM  Result Value Ref Range   Sodium 135 135 - 145 mmol/L   Potassium 4.5 3.5 - 5.1 mmol/L   Chloride 94 (L) 101 - 111 mmol/L   CO2 26 22 -  32 mmol/L   Glucose, Bld 97 65 - 99 mg/dL   BUN 33 (H) 6 - 20 mg/dL   Creatinine, Ser 4.32 (H) 0.44 - 1.00 mg/dL   Calcium 8.4 (L) 8.9 - 10.3 mg/dL   GFR calc non Af Amer 10 (L) >60 mL/min   GFR calc Af Amer 12 (L) >60 mL/min    Comment: (NOTE) The eGFR has been calculated using the CKD EPI equation. This calculation has not been validated in all clinical situations. eGFR's persistently <60 mL/min signify possible Chronic Kidney Disease.    Anion gap 15 5 - 15    Imaging / Studies: No results found.  Medications / Allergies:  Scheduled Meds: . sodium chloride   Intravenous Once  . ALPRAZolam  0.5 mg Oral Q T,Th,Sa-HD  . amiodarone  400 mg Oral BID  . antiseptic oral rinse  7 mL Mouth Rinse q12n4p  . aspirin  81 mg Oral Daily  . budesonide (PULMICORT) nebulizer solution  0.25 mg Nebulization BID  . cefTAZidime (FORTAZ)  IV  2 g Intravenous Q T,Th,Sa-HD  . chlorhexidine  15 mL Mouth Rinse BID  . darbepoetin (ARANESP) injection - DIALYSIS  200 mcg Intravenous Q Thu-HD  . feeding supplement (RESOURCE BREEZE)  1 Container Oral TID BM  . feeding supplement (VITAL AF 1.2 CAL)  1,000 mL Per Tube Q24H  . ipratropium  0.5 mg Nebulization TID  . levothyroxine  25 mcg Oral QAC breakfast  . metoprolol tartrate  12.5 mg Oral BID  . metronidazole  500 mg Intravenous Q8H  . sodium chloride  10-40 mL Intracatheter Q12H   Continuous Infusions: . sodium chloride 10 mL/hr at 08/19/14 0950   PRN Meds:.bisacodyl, camphor-menthol, diphenhydrAMINE, diphenhydrAMINE-zinc acetate, diphenoxylate-atropine, HYDROmorphone (DILAUDID) injection, levalbuterol, LORazepam, ondansetron (ZOFRAN) IV, sodium chloride  Antibiotics: Anti-infectives    Start     Dose/Rate Route Frequency Ordered Stop   08/27/14 1900  cefTAZidime (FORTAZ) 2 g in dextrose 5 % 50 mL IVPB     2 g 100 mL/hr over 30 Minutes Intravenous Every T-Th-Sa (Hemodialysis) 08/27/14 1846     08/27/14 1900  metroNIDAZOLE (  FLAGYL) IVPB 500 mg      500 mg 100 mL/hr over 60 Minutes Intravenous Every 8 hours 08/27/14 1847     08/21/14 1430  cefTAZidime (FORTAZ) 1 g in dextrose 5 % 50 mL IVPB     1 g 100 mL/hr over 30 Minutes Intravenous  Once 08/21/14 1426 08/21/14 1547   08/18/14 1300  metroNIDAZOLE (FLAGYL) IVPB 500 mg  Status:  Discontinued     500 mg 100 mL/hr over 60 Minutes Intravenous 3 times per day 08/18/14 1045 08/27/14 1847   08/18/14 1200  cefTAZidime (FORTAZ) 2 g in dextrose 5 % 50 mL IVPB  Status:  Discontinued     2 g 100 mL/hr over 30 Minutes Intravenous Every T-Th-Sa (Hemodialysis) 08/18/14 1054 08/27/14 1846   08/12/14 1449  ceFAZolin (ANCEF) 2-3 GM-% IVPB SOLR    Comments:  Shaaron Adler   : cabinet override      08/12/14 1449 08/12/14 1629   08/12/14 1130  ceFAZolin (ANCEF) IVPB 2 g/50 mL premix    Comments:  Hang ON CALL to xray 5/11   2 g 100 mL/hr over 30 Minutes Intravenous To Radiology 08/12/14 1035 08/12/14 1520   08/04/14 1200  vancomycin (VANCOCIN) 50 mg/mL oral solution 125 mg  Status:  Discontinued     125 mg Oral 4 times per day 08/04/14 0851 08/08/14 1229   07/18/14 1800  vancomycin (VANCOCIN) 50 mg/mL oral solution 125 mg  Status:  Discontinued     125 mg Oral 4 times per day 07/18/14 1455 08/04/14 0845   07/17/14 1830  vancomycin (VANCOCIN) IVPB 750 mg/150 ml premix     750 mg 150 mL/hr over 60 Minutes Intravenous  Once 07/17/14 1818 07/18/14 0024   07/15/14 1400  metroNIDAZOLE (FLAGYL) tablet 500 mg  Status:  Discontinued     500 mg Oral 3 times per day 07/15/14 1202 07/18/14 1533   07/13/14 2200  piperacillin-tazobactam (ZOSYN) IVPB 2.25 g  Status:  Discontinued     2.25 g 100 mL/hr over 30 Minutes Intravenous 3 times per day 07/13/14 1318 07/24/14 1138   07/13/14 2000  fluconazole (DIFLUCAN) IVPB 200 mg  Status:  Discontinued     200 mg 100 mL/hr over 60 Minutes Intravenous Every 24 hours 07/13/14 1318 07/19/14 1027   07/07/14 1200  vancomycin (VANCOCIN) IVPB 1000 mg/200 mL premix  Status:   Discontinued     1,000 mg 200 mL/hr over 60 Minutes Intravenous Every 24 hours 07/07/14 0937 07/14/14 1712   07/06/14 2000  fluconazole (DIFLUCAN) IVPB 400 mg  Status:  Discontinued     400 mg 100 mL/hr over 120 Minutes Intravenous Every 24 hours 07/06/14 1507 07/13/14 1318   07/06/14 1800  piperacillin-tazobactam (ZOSYN) IVPB 2.25 g  Status:  Discontinued     2.25 g 100 mL/hr over 30 Minutes Intravenous 4 times per day 07/06/14 1505 07/13/14 1318   07/05/14 2000  fluconazole (DIFLUCAN) IVPB 200 mg  Status:  Discontinued     200 mg 100 mL/hr over 60 Minutes Intravenous Every 24 hours 07/05/14 0757 07/06/14 1507   07/05/14 1400  piperacillin-tazobactam (ZOSYN) IVPB 2.25 g  Status:  Discontinued     2.25 g 100 mL/hr over 30 Minutes Intravenous 3 times per day 07/05/14 0749 07/06/14 1505   07/02/14 1800  vancomycin (VANCOCIN) IVPB 1000 mg/200 mL premix  Status:  Discontinued     1,000 mg 200 mL/hr over 60 Minutes Intravenous Every 24 hours 07/01/14 1858 07/05/14 0801   07/01/14  2200  piperacillin-tazobactam (ZOSYN) IVPB 3.375 g  Status:  Discontinued     3.375 g 100 mL/hr over 30 Minutes Intravenous 4 times per day 07/01/14 1858 07/05/14 0749   07/01/14 2000  fluconazole (DIFLUCAN) IVPB 400 mg  Status:  Discontinued     400 mg 100 mL/hr over 120 Minutes Intravenous Every 24 hours 07/01/14 1858 07/05/14 0757   07/01/14 1400  fluconazole (DIFLUCAN) IVPB 200 mg  Status:  Discontinued     200 mg 100 mL/hr over 60 Minutes Intravenous Every 24 hours 07/01/14 1330 07/01/14 1853   07/01/14 1000  piperacillin-tazobactam (ZOSYN) IVPB 2.25 g  Status:  Discontinued     2.25 g 100 mL/hr over 30 Minutes Intravenous Every 8 hours 07/01/14 0952 07/01/14 1853   07/01/14 1000  vancomycin (VANCOCIN) 500 mg in sodium chloride 0.9 % 100 mL IVPB     500 mg 100 mL/hr over 60 Minutes Intravenous  Once 07/01/14 0952 07/01/14 1126   06/30/14 1200  vancomycin (VANCOCIN) IVPB 1000 mg/200 mL premix     1,000  mg 200 mL/hr over 60 Minutes Intravenous  Once 06/30/14 0944 06/30/14 1403   06/30/14 1200  ceFEPIme (MAXIPIME) 2 g in dextrose 5 % 50 mL IVPB     2 g 100 mL/hr over 30 Minutes Intravenous  Once 06/30/14 0944 06/30/14 1425   06/30/14 0100  vancomycin (VANCOCIN) 2,000 mg in sodium chloride 0.9 % 500 mL IVPB     2,000 mg 250 mL/hr over 120 Minutes Intravenous  Once 06/30/14 0048 06/30/14 0525   06/30/14 0100  ceFEPIme (MAXIPIME) 2 g in dextrose 5 % 50 mL IVPB     2 g 100 mL/hr over 30 Minutes Intravenous  Once 06/30/14 0048 06/30/14 0322   06/27/14 0600  cefUROXime (ZINACEF) 1.5 g in dextrose 5 % 50 mL IVPB     1.5 g 100 mL/hr over 30 Minutes Intravenous On call to O.R. 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   Dispo-per primary team, surgically stable   09/05/2014 8:04 AM

## 2014-09-05 NOTE — Progress Notes (Signed)
[facility] Progress Note   Subjective: up in chair  Filed Vitals:   09/05/14 0500 09/05/14 0502 09/05/14 0739 09/05/14 0803  BP:  92/61  91/52  Pulse:  110  118  Temp:  98.5 F (36.9 C)  97.4 F (36.3 C)  TempSrc:  Axillary  Oral  Resp:  19  24  Height:      Weight: 95 kg (209 lb 7 oz)     SpO2:  99% 96% 96%   Exam: Alert up in chair, much stronger No jvd Chest bibasilar insp crackle, no bronchial BS RRR no MRG Abd soft, drains and G-J tube in place 2+ LLE edema , 1-2+ hip edema bilat Neuro is alert, nf, ox 3  HD: new start this admission TTS in hospita.,  4h  No heparin  Use HD cath (L AVF not using now, see below)        Assessment: 1. New ESRD using HD cath 2. Access AVF not usable , VVS to revise possibly when more stable 3. Vol stable, getting 2-3 kg off each HD, some excess 4. Pseudomonal pelvic abscess on Ceftaz/Flagyl, hx/o C Diff 5. CVA acute by MRI 3/19 6. Perf pyloric ulcer SP exlap, w duod fistula to suction, output down, CCS following 7. Nutrition w G-J tube on TF's and eating solids 8. C diff - s/p po Vanc 9. Anemiaon max darbe, s/p Fe load 5/21, 2u prbc 5/26 10. MBD - iPTH 130 5/12, low P resolved  11. Parox SVT on po MTP 12.5 bid and po Amio 400 bid, better 12. 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 Left LE edema - doppler ordered ? results  Plan - HD today    Vinson Moselleob Jah Alarid MD  pager [phone]    cell [phone]  09/05/2014, 11:51 AM     Recent Labs Lab 09/03/14 0350 09/04/14 0430 09/05/14 0532  NA 137 135 135  K 4.2 4.3 4.5  CL 97* 99* 94*  CO2 26 27 26   GLUCOSE 93 92 97  BUN 37* 19 33*  CREATININE 4.67* 2.86* 4.32*  CALCIUM 8.2* 7.7* 8.4*   No results for input(s): AST, ALT, ALKPHOS, BILITOT, PROT, ALBUMIN in the last 168 hours.  Recent Labs Lab 09/03/14 0350 09/04/14 0430 09/05/14 0532  WBC 11.5* 9.9 11.1*  HGB 9.3* 9.0* 9.2*  HCT 29.7* 28.8* 29.4*  MCV 94.6  93.8 92.7  PLT 208 190 253   . sodium chloride   Intravenous Once  . ALPRAZolam  0.5 mg Oral Q T,Th,Sa-HD  . amiodarone  400 mg Oral BID  . antiseptic oral rinse  7 mL Mouth Rinse q12n4p  . aspirin  81 mg Oral Daily  . budesonide (PULMICORT) nebulizer solution  0.25 mg Nebulization BID  . cefTAZidime (FORTAZ)  IV  2 g Intravenous Q T,Th,Sa-HD  . chlorhexidine  15 mL Mouth Rinse BID  . darbepoetin (ARANESP) injection - DIALYSIS  200 mcg Intravenous Q Thu-HD  . feeding supplement (RESOURCE BREEZE)  1 Container Oral TID BM  . feeding supplement (VITAL AF 1.2 CAL)  1,000 mL Per Tube Q24H  . ipratropium  0.5 mg Nebulization TID  . levothyroxine  25 mcg Oral QAC breakfast  . metoprolol tartrate  12.5 mg Oral BID  . metronidazole  500 mg Intravenous Q8H  . sodium chloride  10-40 mL Intracatheter Q12H   . sodium chloride 10 mL/hr at 08/19/14 0950   bisacodyl, camphor-menthol,  diphenhydrAMINE, diphenhydrAMINE-zinc acetate, diphenoxylate-atropine, HYDROmorphone (DILAUDID) injection, levalbuterol, LORazepam, ondansetron (ZOFRAN) IV, sodium chloride

## 2014-09-06 LAB — MAGNESIUM: Magnesium: 1.8 mg/dL (ref 1.7–2.4)

## 2014-09-06 MED ORDER — VITAL AF 1.2 CAL PO LIQD
1000.0000 mL | ORAL | Status: DC
  Administered 2014-09-06: 1000 mL
  Filled 2014-09-06 (×2): qty 1000

## 2014-09-06 MED ORDER — MAGNESIUM SULFATE 2 GM/50ML IV SOLN: 2.0000 g | Freq: Once | INTRAVENOUS | Status: DC

## 2014-09-06 NOTE — Progress Notes (Signed)
67 Days Post-Op  Subjective: No complaints No drain output  Objective: Vital signs in last 24 hours: Temp:  [96.7 F (35.9 C)-98.2 F (36.8 C)] 97.9 F (36.6 C) (06/05 0413) Pulse Rate:  [83-124] 114 (06/05 0413) Resp:  [20-27] 23 (06/05 0413) BP: (81-131)/(52-105) 82/60 mmHg (06/05 0413) SpO2:  [95 %-97 %] 96 % (06/05 0413) Last BM Date: 09/05/14  Intake/Output from previous day: 06/04 0701 - 06/05 0700 In: 1280 [P.O.:240; I.V.:60; NG/GT:780; IV Piggyback:200] Out: 2331  Intake/Output this shift:    Abdomen soft, G-tube with bile  Lab Results:   Recent Labs  09/04/14 0430 09/05/14 0532  WBC 9.9 11.1*  HGB 9.0* 9.2*  HCT 28.8* 29.4*  PLT 190 253   BMET  Recent Labs  09/04/14 0430 09/05/14 0532  NA 135 135  K 4.3 4.5  CL 99* 94*  CO2 27 26  GLUCOSE 92 97  BUN 19 33*  CREATININE 2.86* 4.32*  CALCIUM 7.7* 8.4*   PT/INR No results for input(s): LABPROT, INR in the last 72 hours. ABG No results for input(s): PHART, HCO3 in the last 72 hours.  Invalid input(s): PCO2, PO2  Studies/Results: No results found.  Anti-infectives: Anti-infectives    Start     Dose/Rate Route Frequency Ordered Stop   08/27/14 1900  cefTAZidime (FORTAZ) 2 g in dextrose 5 % 50 mL IVPB     2 g 100 mL/hr over 30 Minutes Intravenous Every T-Th-Sa (Hemodialysis) 08/27/14 1846     08/27/14 1900  metroNIDAZOLE (FLAGYL) IVPB 500 mg     500 mg 100 mL/hr over 60 Minutes Intravenous Every 8 hours 08/27/14 1847     08/21/14 1430  cefTAZidime (FORTAZ) 1 g in dextrose 5 % 50 mL IVPB     1 g 100 mL/hr over 30 Minutes Intravenous  Once 08/21/14 1426 08/21/14 1547   08/18/14 1300  metroNIDAZOLE (FLAGYL) IVPB 500 mg  Status:  Discontinued     500 mg 100 mL/hr over 60 Minutes Intravenous 3 times per day 08/18/14 1045 08/27/14 1847   08/18/14 1200  cefTAZidime (FORTAZ) 2 g in dextrose 5 % 50 mL IVPB  Status:  Discontinued     2 g 100 mL/hr over 30 Minutes Intravenous Every T-Th-Sa  (Hemodialysis) 08/18/14 1054 08/27/14 1846   08/12/14 1449  ceFAZolin (ANCEF) 2-3 GM-% IVPB SOLR    Comments:  Troy Sine   : cabinet override      08/12/14 1449 08/12/14 1629   08/12/14 1130  ceFAZolin (ANCEF) IVPB 2 g/50 mL premix    Comments:  Hang ON CALL to xray 5/11   2 g 100 mL/hr over 30 Minutes Intravenous To Radiology 08/12/14 1035 08/12/14 1520   08/04/14 1200  vancomycin (VANCOCIN) 50 mg/mL oral solution 125 mg  Status:  Discontinued     125 mg Oral 4 times per day 08/04/14 0851 08/08/14 1229   07/18/14 1800  vancomycin (VANCOCIN) 50 mg/mL oral solution 125 mg  Status:  Discontinued     125 mg Oral 4 times per day 07/18/14 1455 08/04/14 0845   07/17/14 1830  vancomycin (VANCOCIN) IVPB 750 mg/150 ml premix     750 mg 150 mL/hr over 60 Minutes Intravenous  Once 07/17/14 1818 07/18/14 0024   07/15/14 1400  metroNIDAZOLE (FLAGYL) tablet 500 mg  Status:  Discontinued     500 mg Oral 3 times per day 07/15/14 1202 07/18/14 1533   07/13/14 2200  piperacillin-tazobactam (ZOSYN) IVPB 2.25 g  Status:  Discontinued  2.25 g 100 mL/hr over 30 Minutes Intravenous 3 times per day 07/13/14 1318 07/24/14 1138   07/13/14 2000  fluconazole (DIFLUCAN) IVPB 200 mg  Status:  Discontinued     200 mg 100 mL/hr over 60 Minutes Intravenous Every 24 hours 07/13/14 1318 07/19/14 1027   07/07/14 1200  vancomycin (VANCOCIN) IVPB 1000 mg/200 mL premix  Status:  Discontinued     1,000 mg 200 mL/hr over 60 Minutes Intravenous Every 24 hours 07/07/14 0937 07/14/14 1712   07/06/14 2000  fluconazole (DIFLUCAN) IVPB 400 mg  Status:  Discontinued     400 mg 100 mL/hr over 120 Minutes Intravenous Every 24 hours 07/06/14 1507 07/13/14 1318   07/06/14 1800  piperacillin-tazobactam (ZOSYN) IVPB 2.25 g  Status:  Discontinued     2.25 g 100 mL/hr over 30 Minutes Intravenous 4 times per day 07/06/14 1505 07/13/14 1318   07/05/14 2000  fluconazole (DIFLUCAN) IVPB 200 mg  Status:  Discontinued     200 mg 100  mL/hr over 60 Minutes Intravenous Every 24 hours 07/05/14 0757 07/06/14 1507   07/05/14 1400  piperacillin-tazobactam (ZOSYN) IVPB 2.25 g  Status:  Discontinued     2.25 g 100 mL/hr over 30 Minutes Intravenous 3 times per day 07/05/14 0749 07/06/14 1505   07/02/14 1800  vancomycin (VANCOCIN) IVPB 1000 mg/200 mL premix  Status:  Discontinued     1,000 mg 200 mL/hr over 60 Minutes Intravenous Every 24 hours 07/01/14 1858 07/05/14 0801   07/01/14 2200  piperacillin-tazobactam (ZOSYN) IVPB 3.375 g  Status:  Discontinued     3.375 g 100 mL/hr over 30 Minutes Intravenous 4 times per day 07/01/14 1858 07/05/14 0749   07/01/14 2000  fluconazole (DIFLUCAN) IVPB 400 mg  Status:  Discontinued     400 mg 100 mL/hr over 120 Minutes Intravenous Every 24 hours 07/01/14 1858 07/05/14 0757   07/01/14 1400  fluconazole (DIFLUCAN) IVPB 200 mg  Status:  Discontinued     200 mg 100 mL/hr over 60 Minutes Intravenous Every 24 hours 07/01/14 1330 07/01/14 1853   07/01/14 1000  piperacillin-tazobactam (ZOSYN) IVPB 2.25 g  Status:  Discontinued     2.25 g 100 mL/hr over 30 Minutes Intravenous Every 8 hours 07/01/14 0952 07/01/14 1853   07/01/14 1000  vancomycin (VANCOCIN) 500 mg in sodium chloride 0.9 % 100 mL IVPB     500 mg 100 mL/hr over 60 Minutes Intravenous  Once 07/01/14 0952 07/01/14 1126   06/30/14 1200  vancomycin (VANCOCIN) IVPB 1000 mg/200 mL premix     1,000 mg 200 mL/hr over 60 Minutes Intravenous  Once 06/30/14 0944 06/30/14 1403   06/30/14 1200  ceFEPIme (MAXIPIME) 2 g in dextrose 5 % 50 mL IVPB     2 g 100 mL/hr over 30 Minutes Intravenous  Once 06/30/14 0944 06/30/14 1425   06/30/14 0100  vancomycin (VANCOCIN) 2,000 mg in sodium chloride 0.9 % 500 mL IVPB     2,000 mg 250 mL/hr over 120 Minutes Intravenous  Once 06/30/14 0048 06/30/14 0525   06/30/14 0100  ceFEPIme (MAXIPIME) 2 g in dextrose 5 % 50 mL IVPB     2 g 100 mL/hr over 30 Minutes Intravenous  Once 06/30/14 0048 06/30/14 0322    06/27/14 0600  cefUROXime (ZINACEF) 1.5 g in dextrose 5 % 50 mL IVPB     1.5 g 100 mL/hr over 30 Minutes Intravenous On call to O.R. 06/26/14 1019 06/27/14 0630   06/19/14 1800  oseltamivir (TAMIFLU) capsule  30 mg     30 mg Oral Every M-W-F (1800) 06/19/14 1003 06/22/14 1841   06/18/14 1600  oseltamivir (TAMIFLU) capsule 30 mg  Status:  Discontinued     30 mg Oral Daily 06/18/14 1538 06/19/14 1003      Assessment/Plan: s/p Procedure(s): EXPLORATORY LAPAROTOMY WITH CLOSURE OF PERFORATED PYLORIC ULCER (N/[name]) SMALL BOWEL RESECTION (N/[name])  Continuing current care, diet, etc.  LOS: 81 days    [name] 09/06/2014

## 2014-09-06 NOTE — Progress Notes (Signed)
PROGRESS NOTE  Tyniya C Fleeman WUJ:[id number] DOB: Jun 01, 1955 DOA: 06/17/2014 PCP: [name], MD  Brief Narrative: 59 yo ? smoker with hx HTN, COPD, CKD IV followed @ [facility]-[name] Page, failed L AV fistula, initially admitted 3/16 with flu and acute hypoxic resp failure. Ultimately tx to [facility] due to need for HD. L sided weakness 3/18 and found to have R ACA stroke-showed acute infarct R ACA (3/19). Patient more lethargic 3/30 and CT abd=perf viscus 3/30. She was taken to the OR for ex lap for perforated pyloric ulcer with diffuse peritonitis, ischemic necrosis of mid ileum. Patient developed septic shock on 4-1, she was started on pressors and wean off pressors on 4-3. Subsequently, she was diagnosed with pelvic abscess by CT scan perform on 4-7. She underwent pelvic drain placement by IR on 4/8= culture neg 08/15/14 another drained placed in the pelvic cul-de-sac abscess with culture = pseudomonas. On 08/20/14, Gluteal drain was inadvertently pulled out. Repeat CT scan shows improvement in the fluid collection so decision was to leave it out and continue IV abx. Pt hospital stay also complicated by high output duodenal (enterocutaneous fistula) fistula. As a result, pt was made npo and started on TPN. Now stable, on TNA +high output from fistula/leak per [facility]. Had a G_J tube placed by Dr. [name] of IR and Gen surgery has continued to assist with management of her multiple surgical comorbidities C. diff diagnosed 4-12-initially unresponsive to flagyl, and she was started on PO vancomycin 4-16 which she finished 3 week.  5/27--TPN weaned off and increasing TF.  06/24/14 Develops run of SVT, atrial tachycardia. Cardiology was helping with management. Course complicated with high RV pressure and Cor Pulmonale; 4/27 CT angio negative for PE. On 5/2 she developed hypoxemia, hypotension and SVT during dialysis treatment. Dialysis was stopped. She received IV bolus. She was started on BIPAP.  She was transferred to Step down unit. PCCM and cardiology re consulted. Her Hr decreased, BP improved. She was taken off BIPAP. This recurred on 5/15 with episodic SVT. Since then she has had intermitten short episodes of SVT, but on 08/28/14 she has had more sustained SVT, therefore; cardiology was reconsulted on on 08/29/14.  Assessment/Plan: Status post exploratory laparotomy with closure of perforated pyloric ulcer/abdominal abscess / small bowel resection w/ postop ileus and now with enterocutaneous fistula. Pelvic abscess s/p percutaneous drain placement  -completed, long courses of IV  Abx including Vanc and FLuconazole -Due to Fistula, she was made NPO, TPN was started -Fistula study done shows JP drain connection to the duodenal bulb-5-02. - 08/15/2014 repeat CT abdomen with 3.0 x 3.5 x 5.7 cm collection in the pelvic cul-de-sac and extraluminal air alongside JP drain in anterior abd wall -had high output from Fistula/leak. Had G_J placed by IR 08/12/14 at request of gen surgery -CT repeat shows 5 cm post cul-de-sac abscess which was drained on 5/16-culture revealed pseudomonas aeruginosa -ID was consulted as patient had Pseudomonas growing from her culture wounds and they started back Flagyl as well as Elita Quick on 5/16  -Per ID recommendation--plan D#20/21 days of Elita Quick and D#20/ 28 days metronidazole -Patient inadvertently pulled out her gluteal/cul-de-sac drain May 19. Seen by interventional radiology and they recommended repeat CT scan, which was done. Shows that the fluid collection has decreased in size. They do not plan to replace the drain at this time. Continue with antibiotics alone.  -08/21/2014 CT scan did suggest a transmural defect in the  stomach wall near the patient's pyloric Cheree Ditto patch. -Patient accidentally pulled out her JP drain on 5/22. This has been replaced by surgery on 5/23. -Octreotide was discontinued 5/23--enterocutaneous fistula output appears to be decreasing  gradually -finished TPN on 08/27/14 -Enteral Feeding was increased 5/27 to 55 mL per hour--tolerating without residuals -08/29/14--started clear liquids-->tolerating well -08/31/14--pt inadvertently pulled out R-side JP drain again (3rd time) during therapy + GJ tube with bloody drainage--contacted general surgery 09/01/14--IR replaced RUQ drain (entercutaneous fistula)--reminded pt to wear abd binder at all times; no output from this drain 6/1: started full liquids  6/3: started regular diet: ATE a Hamburger!!-Huge milestone as far as patient is concerned  PSVT --Atrial tachycardia -Felt to be secondary to fluid metabolic shifts from ESRD and ongoing abd issues; also has severe anxiety with dialysis as well as COPD/Cor Pulmonale  -was on IV metoprolol and amiodarone, now 5/31 switched to PO  -CTA 4/27, negative for PE  -Cards following  ESRD new, now on HD this admit complicated by intermittent hypotension AKI on CKD 4, due to sepsis, shock Ongoing hemodialysis per Nephrology - perm-cath placed 3/26 d/t AVF problems Patient AV fistula reviewed and not mature yet per Dr. Paulene Floor need to revisit prior to d/c -TPN weaned off 08/27/14 -Continue hemodialysis Tuesday, Thursday, Saturday while the patient is in the hospital -Xanax with each HD due to significant anxiety with HD -starting to sit up with HD now, BPs low will not be able to tolerate much volume removal  C. difficile colitis still on contact precautions -C diff positive on 4-12, Received 4 days of flagyl without improvement, then started on PO vanc -Completed 3 weeks of Oral Vanc-stopped 5/7, this was 1 week after IV Zosyn was stopped 5/5 -5/28--having loose stools again-->Cdiff PCR negative -keep on contact precautions  ACA CVA with Lt sided weakness -06/23/14--TEE ;normal LV function; no LAA thrombus -Started on ASA 81mg  per Neuro; resumed this -Will need ongoing long term rehab - PT/OT to cont to follow while  inpatient -Patient remains very weak and deconditioned. 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 Fluids/volume managed with hemodialysis.  Elevated PA pressures on 2-D echo; CTA negative for PE - 07/23/2014 echo-- EF 60-61%, grade 1 diastolic dysfunction, Severe RV dilatation, PAP 90  Small PFO Has been evaluated by Cardiology. No intervention planned currently.  Anemia of critical illness/chronic disease -and bleeding from leak/fistula which has resolved. -s/p 2U PRBC 4/13 and 5/10 and 5/26 -Aranesp/iron per renal -transfuse for hemoglobin less than 7  Acute hypoxic Respiratory Failure:  -This was secondary to H1N1, hypervolemia, HCAP >> resolved.  -also has COPD and Cor Pulmonale - now on scheduled Xopenex and Atrovent, change to PRN  - presently stable on 2 L-3L  Thrombocytopenia; resolved.  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 TSH in 7 range -continue low dose synthroid given PSVT, changed to PO synthroid  L Renal mass  -noted on [place], Indeterminate 1.9 cm hypoechoic mass off the interpolar region of the left kidney -may potentially represent a  cyst however solid mass is not excluded -needs outpatient FU for this  Fall on May 18  DVT prophylaxis: SCDs Code Status: FULL Family Communication: Discussed with patient. No family at bedside. Disposition Plan: Have been unable to transfer patient out of stepdown unit due to dyspnea and tachycardia. She also has complex wound care  needs. Dialysis is ongoing. Not ready for discharge. Not accepted to LTAC, CIR when drains removed per [facility]  Consultants: Cardiology Nephrology PCCM Gen Surgery  Palliative care    Procedures/Studies: Ct Abdomen Pelvis Wo Contrast  08/22/2014   CLINICAL DATA:  Abdominal abscess. No intravenous contrast due to renal failure.  EXAM: CT ABDOMEN AND PELVIS WITHOUT CONTRAST  TECHNIQUE: Multidetector CT imaging of the abdomen and pelvis was performed following the standard protocol without IV contrast.  COMPARISON:  08/15/2014  FINDINGS: BODY WALL: Open laparotomy without fluid collection.  LOWER CHEST: Chronic triangular opacity in the posterior costophrenic sulcus on the left, atelectasis versus scarring  ABDOMEN/PELVIS:  Liver: No focal abnormality.  Biliary: High-density material within the gallbladder showing mild wall thickening which is chronic.  Pancreas: Unremarkable.  Spleen: Unremarkable.  Adrenals: Unremarkable.  Kidneys and ureters: No hydronephrosis or stone. Presumed 1 cm cyst from the interpolar left kidney  Bladder: Decompressed.  No pathologic findings.  Reproductive: Calcified and noncalcified uterine fibroids, better seen on previous enhanced imaging.  Bowel: Right upper quadrant drain related to perforated pyloric ulcer status post Cheree Ditto patch. Specially visible on coronal imaging, there is a gas channel through the pylorus measuring 6 to 9 mm in diameter. No bowel obstruction. Enteroenterostomy noted in the pelvis. Oral contrast again seen in the colon. No notable colonic wall thickening in this patient with history of C difficile. Negative appendix. Percutaneous gastrojejunostomy tube is in good position.  Retroperitoneum: No mass or adenopathy.  Peritoneum: Known thick walled collection in the rectovaginal recess smaller than 08/15/2014 at 40 x 16 mm as compared to 51 x 32 mm. No new collection is identified. No free pneumoperitoneum.  Vascular: 3 cm fusiform infrarenal aortic  aneurysm.  OSSEOUS: No acute abnormalities.  IMPRESSION: 1. Near the patient's pyloric Cheree Ditto patch is a transmural defect in the stomach wall, contiguous with the surgical drain. If repeat surgery is considered, oral contrast could confirm an open ulcer. 2. Decreased rectovaginal recess fluid collection, now 4 x 1.5 cm (previously 5 x 3 cm).   Electronically Signed   By: Marnee Spring M.D.   On: 08/22/2014 01:49   Ct Abdomen Pelvis Wo Contrast  08/15/2014   CLINICAL DATA:  Intra abdominal abscess.  EXAM: CT ABDOMEN AND PELVIS WITHOUT CONTRAST  TECHNIQUE: Multidetector CT imaging of the abdomen and pelvis was performed following the standard protocol without IV contrast.  COMPARISON:  CT scan of Aug 03, 2014.  FINDINGS: Severe degenerative disc disease is noted at L5-S1. Minimal subsegmental atelectasis is noted posteriorly in the left lung base.  No gallstones are noted. No focal abnormality is noted in the liver, spleen or pancreas on these unenhanced images. Adrenal glands appear normal. Stable exophytic cyst is seen arising from midpole of left kidney. No hydronephrosis or renal obstruction is noted. No renal or ureteral calculi are noted. 3 cm infrarenal abdominal aortic aneurysm is noted. Gastrojejunostomy tube is seen entering stomach percutaneously and with distal tip in the jejunum. JP surgical drain is seen entering right upper quadrant of abdomen underneath the liver with distal tip in the left upper quadrant. No significant fluid collection is noted around the  drain. Fluid collection is again identified and posterior cul-de-sac of pelvis measuring 5.1 x 3.2 cm. Calcified degenerating fibroid is noted in the uterus. Urinary bladder is unremarkable. There is no evidence of bowel obstruction. The appendix appears normal. No significant adenopathy is noted. Large midline surgical anterior abdominal wall incision is noted.  IMPRESSION: Stable 3 cm infrarenal abdominal aortic aneurysm.  No change in  position of JP surgical drain with distal tip in the left upper quadrant of the abdomen.  Interval placement of percutaneous gastrojejunostomy tube with distal tip in the jejunum.  Grossly stable size and appearance of 5 cm fluid collection seen in posterior cul-de-sac of the pelvis.  Continued presence uterine fibroids.   Electronically Signed   By: Lupita Raider, M.D.   On: 08/15/2014 15:15   Dg Chest 1 View  08/13/2014   CLINICAL DATA:  Shortness of breath  EXAM: CHEST  1 VIEW  COMPARISON:  08/03/2014  FINDINGS: Moderately severe cardiac enlargement. Central line in unchanged position on the right and left side. Mild vascular congestion with no evidence of edema effusion or consolidation.  IMPRESSION: No acute findings   Electronically Signed   By: Esperanza Heir M.D.   On: 08/13/2014 11:04   Dg Chest 2 View  09/01/2014   CLINICAL DATA:  Followup respiratory failure  EXAM: CHEST  2 VIEW  COMPARISON:  08/23/2014  FINDINGS: Moderate cardiac enlargement. Left IJ catheter is identified with tip in the SVC. There is a right-sided dialysis catheter with tips in the SVC. No pleural effusion or edema. No airspace consolidation.  IMPRESSION: 1. Lungs remain clear. 2. Catheters positioned as above.   Electronically Signed   By: Signa Kell M.D.   On: 09/01/2014 09:04   Ir Gastrostomy Tube Mod Sed  08/12/2014   CLINICAL DATA:  Perforated duodenal ulcer, post g patch with persistent leak. Percutaneous gastrojejunostomy as requested for gastric decompression and small bowel enteral feeding support.  EXAM: PERC PLACEMENT GASTROSTOMY;  CONVERT G-TUBE TO G-JTUBE  FLUOROSCOPY TIME:  16 minutes 24 seconds, 1016.6 mGy  TECHNIQUE: The procedure, risks, benefits, and alternatives were explained to the patient. Questions regarding the procedure were encouraged and answered. The patient understands and consents to the procedure. As antibiotic prophylaxis, cefazolin 2 g was ordered pre-procedure and administered  intravenously within one hour of incision. . A 5 French angiographic catheter was placed as orogastric tube. The upper abdomen was prepped with Betadine, draped in usual sterile fashion, and infiltrated locally with 1% lidocaine. 1 mg glucagon was administered intravenously to facilitate gastric distention and slow peristalsis. Stomach was insufflated using air through the orogastric tube. An 1 French sheath needle was advanced percutaneously into the gastric lumen under fluoroscopy. Gas could be aspirated and a small contrast injection confirmed intraluminal spread. The sheath was exchanged over a guidewire for a 9 [place] vascular sheath, through which the snare device was advanced and used to snare a guidewire passed through the orogastric tube. This was withdrawn, and the snare attached to the 20 French pull-through gastrostomy tube, which was advanced antegrade, positioned with the internal bumper securing the anterior gastric wall to the anterior abdominal wall. Small contrast injection confirms appropriate positioning. The external bumper was applied and the catheter was flushed.  A 5 French angiographic catheter was placed coaxially through the gastrostomy tube and used to direct an angled stiff glidewire across the pylorus, beyond the proximal duodenal perforation, and into the proximal jejunum just beyond the ligament of Treitz. Over this,  a coaxial jejunostomy feeding adapter was advanced. Contrast injection confirmed patency and appropriate position of the gastric and jejunal lumens. No extravasation. The lumens were flushed with saline and capped. The patient tolerated the procedure well.  COMPLICATIONS: COMPLICATIONS none  IMPRESSION: 1. Technically successful 20 French pull-through gastrostomy placement under fluoroscopy. 2. Technically successful coaxial jejunostomy feeding adapter placement to the ligament of Treitz.   Electronically Signed   By: Corlis Leak M.D.   On: 08/12/2014 16:12   Ir  Catheter Tube Change  09/01/2014   CLINICAL DATA:  History of a duodenal fistula. Surgical drain recently fell out and request for drain replacement.  EXAM: REPLACEMENT OF PERCUTANEOUS DRAIN WITH FLUOROSCOPY  Physician: Rachelle Hora. Henn, MD  FLUOROSCOPY TIME:  3 minutes, 246 mGy  MEDICATIONS AND MEDICAL HISTORY: None  ANESTHESIA/SEDATION: Moderate sedation time: None  CONTRAST:  15 mL Omnipaque-300  PROCEDURE: The procedure was explained to the patient. The risks and benefits of the procedure were discussed and the patient's questions were addressed. Informed consent was obtained from the patient. The right upper abdomen was prepped and draped in sterile fashion. Maximal barrier sterile technique was utilized including caps, mask, sterile gowns, sterile gloves, sterile drape, hand hygiene and skin antiseptic. A 5 French catheter was advanced through the old drain site. Contrast was injected as the catheter was advanced into the tissue. Contrast injection demonstrated a connection to the duodenum bulb and stomach. Catheter and wire preferentially went into the stomach via the duodenal bulb. A series of wires were used to advance a 12 French [employer] catheter into the abdomen. The tip of the catheter was cut in order to advance the catheter over a wire. The tip was placed adjacent to the duodenal fistula but not within the bowel. Catheter was sutured to the skin. Catheter was attached to gravity bag.  FINDINGS: Focal fistula connection between the drain tract and the duodenal bulb. New 12 French catheter was placed within the fistula tract but outside of the bowel.  Estimated blood loss: Minimal  COMPLICATIONS: None  IMPRESSION: Successful replacement of the percutaneous drain. Demonstration of the duodenal bulb fistula. Drain was placed adjacent to the fistula opening but outside of the bowel.   Electronically Signed   By: Richarda Overlie M.D.   On: 09/01/2014 13:39   Ir Adele Schilder Tamsen Snider Convert Gastr-jej Per W/fl Mod  Sed  08/12/2014   CLINICAL DATA:  Perforated duodenal ulcer, post g patch with persistent leak. Percutaneous gastrojejunostomy as requested for gastric decompression and small bowel enteral feeding support.  EXAM: PERC PLACEMENT GASTROSTOMY;  CONVERT G-TUBE TO G-JTUBE  FLUOROSCOPY TIME:  16 minutes 24 seconds, 1016.6 mGy  TECHNIQUE: The procedure, risks, benefits, and alternatives were explained to the patient. Questions regarding the procedure were encouraged and answered. The patient understands and consents to the procedure. As antibiotic prophylaxis, cefazolin 2 g was ordered pre-procedure and administered intravenously within one hour of incision. . A 5 French angiographic catheter was placed as orogastric tube. The upper abdomen was prepped with Betadine, draped in usual sterile fashion, and infiltrated locally with 1% lidocaine. 1 mg glucagon was administered intravenously to facilitate gastric distention and slow peristalsis. Stomach was insufflated using air through the orogastric tube. An 91 French sheath needle was advanced percutaneously into the gastric lumen under fluoroscopy. Gas could be aspirated and a small contrast injection confirmed intraluminal spread. The sheath was exchanged over a guidewire for a 9 [place] vascular sheath, through which the snare device was advanced  and used to snare a guidewire passed through the orogastric tube. This was withdrawn, and the snare attached to the 20 French pull-through gastrostomy tube, which was advanced antegrade, positioned with the internal bumper securing the anterior gastric wall to the anterior abdominal wall. Small contrast injection confirms appropriate positioning. The external bumper was applied and the catheter was flushed.  A 5 French angiographic catheter was placed coaxially through the gastrostomy tube and used to direct an angled stiff glidewire across the pylorus, beyond the proximal duodenal perforation, and into the proximal jejunum just  beyond the ligament of Treitz. Over this, a coaxial jejunostomy feeding adapter was advanced. Contrast injection confirmed patency and appropriate position of the gastric and jejunal lumens. No extravasation. The lumens were flushed with saline and capped. The patient tolerated the procedure well.  COMPLICATIONS: COMPLICATIONS none  IMPRESSION: 1. Technically successful 20 French pull-through gastrostomy placement under fluoroscopy. 2. Technically successful coaxial jejunostomy feeding adapter placement to the ligament of Treitz.   Electronically Signed   By: Corlis Leak  Hassell M.D.   On: 08/12/2014 16:12   Dg Chest Port 1 View  08/23/2014   CLINICAL DATA:  Left chest pain  EXAM: PORTABLE CHEST - 1 VIEW  COMPARISON:  08/18/2014  FINDINGS: Patient is rotated.  Lungs are essentially clear. No focal consolidation. No pleural effusion or pneumothorax.  Cardiomegaly.  Left IJ venous catheter terminates at the cavoatrial junction. Right IJ dual lumen dialysis catheter terminates in the lower SVC.  IMPRESSION: No evidence of acute cardiopulmonary disease.   Electronically Signed   By: Charline BillsSriyesh  Krishnan M.D.   On: 08/23/2014 19:17   Dg Chest Port 1 View  08/18/2014   CLINICAL DATA:  Shortness of breath at rest.  Having hemodialysis.  EXAM: PORTABLE CHEST - 1 VIEW  COMPARISON:  08/15/2014.  FINDINGS: The cardiac silhouette remains mildly enlarged. Stable right jugular double-lumen catheter. Clear lungs. The pulmonary vasculature remains prominent. No pleural fluid. Minimal right shoulder degenerative changes.  IMPRESSION: Stable cardiomegaly and pulmonary vascular congestion.   Electronically Signed   By: Beckie SaltsSteven  Reid M.D.   On: 08/18/2014 09:47   Dg Chest Port 1 View  08/15/2014   CLINICAL DATA:  Shortness of breath.  EXAM: PORTABLE CHEST - 1 VIEW  COMPARISON:  08/13/2014 and multiple prior chest radiographs  FINDINGS: Cardiomegaly and pulmonary vascular congestion again noted.  A right IJ central venous catheter is noted  with tips overlying the upper and mid SVC.  A left central venous catheter is present with tip overlying the lower SVC.  There is no evidence of focal airspace disease, pulmonary edema, suspicious pulmonary nodule/mass, pleural effusion, or pneumothorax. No acute bony abnormalities are identified.  IMPRESSION: Cardiomegaly with pulmonary vascular congestion.   Electronically Signed   By: Harmon PierJeffrey  Hu M.D.   On: 08/15/2014 17:28   Dg Abd Portable 1v  08/23/2014   CLINICAL DATA:  JP drain, broken, initial encounter  EXAM: PORTABLE ABDOMEN - 1 VIEW  COMPARISON:  CT abdomen pelvis dated 08/21/2014  FINDINGS: A portion of the right flank is excluded from this single image.  The patient's right abdominal surgical drain is not visualized.  Gastrojejunostomy in satisfactory position.  IMPRESSION: The patient's right abdominal surgical drain is not visualized.  Gastrojejunostomy in satisfactory position.   Electronically Signed   By: Charline BillsSriyesh  Krishnan M.D.   On: 08/23/2014 19:11   Ct Image Guided Fluid Drain By Catheter  08/17/2014   CLINICAL DATA:  Postoperative intra-abdominal abscess, subsequent encounter. Small pelvic  fluid collection.  EXAM: CT-GUIDED DRAIN PLACEMENT IN PELVIC FLUID COLLECTION  Physician: Rachelle Hora. Lowella Dandy, MD  FLUOROSCOPY TIME:  None  MEDICATIONS: 50 mcg fentanyl  ANESTHESIA/SEDATION: Patient was monitored by a radiology nurse during the procedure.  PROCEDURE: Informed consent was obtained for drain placement. Patient was placed on her left side. Images through the pelvis were obtained. The right buttock was prepped and draped in sterile fashion. 1% lidocaine was used for a local anesthetic. An 18 gauge needle was directed into the pelvic fluid collection with CT guidance from a transgluteal approach. Thick yellow purulent fluid was aspirated. A stiff Amplatz wire was placed. The tract was dilated to accommodate a 10.2 [place] multipurpose drain. It was technically difficult to advance the drain into  the collection due to the small size of the collection and the patient's body habitus. Eventually, the drain was successfully placed within the collection. 5 mL of yellow purulent fluid was obtained. Catheter was sutured to the skin and attached to a suction bulb. Fluid was sent for culture.  FINDINGS: Small fluid collection just posterior to the uterus. A total of 5 mL of yellow purulent fluid was removed.  Estimated blood loss: Minimal  COMPLICATIONS: None  IMPRESSION: CT-guided placement of a drainage catheter in the small pelvic fluid collection.   Electronically Signed   By: Richarda Overlie M.D.   On: 08/17/2014 17:21        Subjective: Feels ok, Mild chronic abd pain, unchanged  Objective: Filed Vitals:   09/06/14 0413 09/06/14 0751 09/06/14 0853 09/06/14 1153  BP: 82/60  91/64 95/55  Pulse: 114  113 99  Temp: 97.9 F (36.6 C)  97.7 F (36.5 C) 98.1 F (36.7 C)  TempSrc: Oral  Oral Oral  Resp: Height:      Weight:      SpO2: 96% 98% 96% 97%    Intake/Output Summary (Last 24 hours) at 09/06/14 1421 Last data filed at 09/06/14 0959  Gross per 24 hour  Intake   1080 ml  Output   2331 ml  Net  -1251 ml   Weight change:  Exam:   General:  Pt is alert, follows commands appropriately, not in acute distress, chronically ill appearing, seen sitting in recliner  HEENT: No icterus,  Benson/AT  Cardiovascular: RRR, S1/S2, no rubs, no gallops  Respiratory: basilar ronchi, Good air movement.  Abdomen: Soft/+BS, non tender, non distended, no guarding, 2drains noted, L sided drain with mod amount of greenish output and R sided drain without any output  Extremities: trace LE edema, No lymphangitis, No petechiae, No rashes, no synovitis  Data Reviewed: Basic Metabolic Panel:  Recent Labs Lab 09/01/14 1430 09/03/14 0350 09/04/14 0430 09/05/14 0532 09/06/14 1230  NA 142 137 135 135  --   K 3.7 4.2 4.3 4.5  --   CL 95* 97* 99* 94*  --   CO2 --   GLUCOSE  104* 93 92 97  --   BUN 43* 37* 19 33*  --   CREATININE 5.72* 4.67* 2.86* 4.32*  --   CALCIUM 8.2* 8.2* 7.7* 8.4*  --   MG  --   --   --   --  1.8   Liver Function Tests: No results for input(s): AST, ALT, ALKPHOS, BILITOT, PROT, ALBUMIN in the last 168 hours. No results for input(s): LIPASE, AMYLASE in the last 168 hours. No results for input(s): AMMONIA in the last 168 hours.  CBC:  Recent Labs Lab 08/31/14 0600 09/01/14 1430 09/03/14 0350 09/04/14 0430 09/05/14 0532  WBC 11.3* 11.1* 11.5* 9.9 11.1*  HGB 9.1* 9.0* 9.3* 9.0* 9.2*  HCT 29.7* 28.7* 29.7* 28.8* 29.4*  MCV 95.8 93.8 94.6 93.8 92.7  PLT 232 252 208 190 253   Cardiac Enzymes: No results for input(s): CKTOTAL, CKMB, CKMBINDEX, TROPONINI in the last 168 hours. BNP: Invalid input(s): POCBNP CBG:  Recent Labs Lab 09/01/14 2350  GLUCAP 97    Recent Results (from the past 240 hour(s))  Clostridium Difficile by PCR     Status: None   Collection Time: 08/31/14  2:35 PM  Result Value Ref Range Status   C difficile by pcr NEGATIVE NEGATIVE Final     Scheduled Meds: . ALPRAZolam  0.5 mg Oral Q T,Th,Sa-HD  . amiodarone  400 mg Oral BID  . antiseptic oral rinse  7 mL Mouth Rinse q12n4p  . aspirin  81 mg Oral Daily  . budesonide (PULMICORT) nebulizer solution  0.25 mg Nebulization BID  . cefTAZidime (FORTAZ)  IV  2 g Intravenous Q T,Th,Sa-HD  . chlorhexidine  15 mL Mouth Rinse BID  . darbepoetin (ARANESP) injection - DIALYSIS  200 mcg Intravenous Q Thu-HD  . feeding supplement (RESOURCE BREEZE)  1 Container Oral TID BM  . feeding supplement (VITAL AF 1.2 CAL)  1,000 mL Per Tube Q24H  . ipratropium  0.5 mg Nebulization TID  . levothyroxine  25 mcg Oral QAC breakfast  . metoprolol tartrate  12.5 mg Oral BID  . metronidazole  500 mg Intravenous Q8H  . sodium chloride  10-40 mL Intracatheter Q12H   Continuous Infusions: . sodium chloride 10 mL/hr at 08/19/14 0950     Zannie Cove, MD  [facility] Pager [phone]  If 7PM-7AM, please contact night-coverage [email or web address] Password [email or web address] 09/06/2014, 2:21 PM   LOS: 81 days

## 2014-09-06 NOTE — Progress Notes (Signed)
[facility] Progress Note   Subjective: up in chair  Filed Vitals:   09/06/14 0751 09/06/14 0853 09/06/14 1153 09/06/14 1429  BP:  91/64 95/55   Pulse:  113 99   Temp:  97.7 F (36.5 C) 98.1 F (36.7 C)   TempSrc:  Oral Oral   Resp:  29 16   Height:      Weight:      SpO2: 98% 96% 97% 96%   Exam: Alert up in chair, much stronger No jvd Chest slight crackles at bases RRR no MRG Abd soft, RUQ drain and G-J tube in place 2+ LLE edema , 1-2+ hip edema bilat Neuro is alert, nf, ox 3  HD: new start this admission TTS in hospita.,  4h  No heparin  Use HD cath (L AVF not using now, see below)        Assessment: 1. 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 Access AVF not usable , VVS to revise possibly when more stable 5. Vol stable, some edema still. Last uf 2.3 kg 6. Pseudomonal pelvic abscess s/p drainage on Ceftaz/Flagyl 7. Nutrition s/p TNA, then G-tube feeds, now eating solid food 8. C diff - s/p po Vanc 9. Anemia max darbe, s/p Fe load 5/21, 2u prbc 5/26 10. MBD - iPTH 130 5/12, low P resolved  11. Parox SVT on po MTP 12.5 bid and po Amio 400 bid, better 12. 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 Left LE edema - doppler negative for DVT  Plan - HD Tuesday    Vinson Moselleob Jeniel Slauson MD  pager [phone]    cell [phone]  09/06/2014, 3:24 PM     Recent Labs Lab 09/03/14 0350 09/04/14 0430 09/05/14 0532  NA 137 135 135  K 4.2 4.3 4.5  CL 97* 99* 94*  CO2 26 27 26   GLUCOSE 93 92 97  BUN 37* 19 33*  CREATININE 4.67* 2.86* 4.32*  CALCIUM 8.2* 7.7* 8.4*   No results for input(s): AST, ALT, ALKPHOS, BILITOT, PROT, ALBUMIN in the last 168 hours.  Recent Labs Lab 09/03/14 0350 09/04/14 0430 09/05/14 0532  WBC 11.5* 9.9 11.1*  HGB 9.3* 9.0* 9.2*  HCT 29.7* 28.8* 29.4*  MCV 94.6 93.8 92.7   PLT 208 190 253   . ALPRAZolam  0.5 mg Oral Q T,Th,Sa-HD  . amiodarone  400 mg Oral BID  . antiseptic oral rinse  7 mL Mouth Rinse q12n4p  . aspirin  81 mg Oral Daily  . budesonide (PULMICORT) nebulizer solution  0.25 mg Nebulization BID  . cefTAZidime (FORTAZ)  IV  2 g Intravenous Q T,Th,Sa-HD  . chlorhexidine  15 mL Mouth Rinse BID  . darbepoetin (ARANESP) injection - DIALYSIS  200 mcg Intravenous Q Thu-HD  . feeding supplement (RESOURCE BREEZE)  1 Container Oral TID BM  . feeding supplement (VITAL AF 1.2 CAL)  1,000 mL Per Tube Q24H  . ipratropium  0.5 mg Nebulization TID  . levothyroxine  25 mcg Oral QAC breakfast  . metoprolol tartrate  12.5 mg Oral BID  . metronidazole  500 mg Intravenous Q8H  . sodium chloride  10-40 mL Intracatheter Q12H   . sodium chloride 10 mL/hr at 08/19/14 0950   bisacodyl, camphor-menthol, diphenhydrAMINE, diphenhydrAMINE-zinc acetate, diphenoxylate-atropine, HYDROmorphone (DILAUDID) injection, levalbuterol, LORazepam, ondansetron (ZOFRAN) IV, sodium chloride

## 2014-09-07 ENCOUNTER — Inpatient Hospital Stay (HOSPITAL_COMMUNITY): Payer: Medicaid Other

## 2014-09-07 DIAGNOSIS — T82898A Other specified complication of vascular prosthetic devices, implants and grafts, initial encounter: Secondary | ICD-10-CM

## 2014-09-07 DIAGNOSIS — N186 End stage renal disease: Secondary | ICD-10-CM

## 2014-09-07 MED ORDER — IOHEXOL 300 MG/ML  SOLN
50.0000 mL | Freq: Once | INTRAMUSCULAR | Status: AC | PRN
  Administered 2014-09-07: 12 mL

## 2014-09-07 MED ORDER — VITAL AF 1.2 CAL PO LIQD: 1000.0000 mL | ORAL | Status: DC

## 2014-09-07 MED ORDER — RENA-VITE PO TABS
1.0000 | ORAL_TABLET | Freq: Every day | ORAL | Status: DC
  Administered 2014-09-07 – 2014-09-10 (×4): 1 via ORAL
  Filled 2014-09-07 (×5): qty 1

## 2014-09-07 MED ORDER — VITAL AF 1.2 CAL PO LIQD
1000.0000 mL | ORAL | Status: DC
  Administered 2014-09-07 – 2014-09-10 (×4): 1000 mL
  Filled 2014-09-07 (×5): qty 1000

## 2014-09-07 NOTE — Progress Notes (Signed)
Physical Therapy Treatment Patient Details Name: [name] MRN: [id number] DOB: Jul 03, 1955 Today's Date: 09/07/2014    History of Present Illness [name] is a 59 y.o. female with a history of CKD and hypertension came to the [facility] ED on 3/16 complaining of shortness of breath and generalized weakness x 2 weeks. Admitted with acute hypoxemic respiratory failure. During hospital stay noted to have weakness of her left side. 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  She developed septic shock 4/1 with pressors weaned off 4/3.  Began CRRT and pressors restarted 4/4; Developed pelvic abcess 4/7 with pelvic drain placed 4/8.  weaned off pressors 4/9.  Runs of SVT 4/5 and 4/11. Prolonged hospital course with hypoxemia and hypotension. Right abcominal drain accidentally pulled 5/30 and replaced 5/31    PT Comments    Pt with improved mood, motivation and mobility today. Pt able to complete gait with only 2 seated rests and perform bil LE HEP after. Pt verbalized need to be OOB and perform HEP. Will continue to follow.   Follow Up Recommendations  CIR;Supervision/Assistance - 24 hour     Equipment Recommendations       Recommendations for Other Services       Precautions / Restrictions Precautions Precautions: Fall Precaution Comments: abdominal drain on right,  G tube, abdominal binder loosely at all times Other Brace/Splint: abdominal binder    Mobility  Bed Mobility   Bed Mobility: Supine to Sit     Supine to sit: HOB elevated;Min assist     General bed mobility comments: pt able to transfer from supine with HOB 30 to sitting EOB with only cues and use of rail but required min assist to scoot hips fully to EOB  Transfers Overall transfer level: Needs assistance   Transfers: Sit to/from Stand Sit to Stand: Min assist         General  transfer comment: mod cues for anterior translation, safety and sequence with left foot blocked x 3 trials  Ambulation/Gait Ambulation/Gait assistance: Min assist;+2 safety/equipment Ambulation Distance (Feet): 82 Feet Assistive device: Rolling walker (2 wheeled) Gait Pattern/deviations: Step-through pattern;Decreased stride length;Trunk flexed;Wide base of support   Gait velocity interpretation: Below normal speed for age/gender General Gait Details: Pt walked 62', 82', 16' with seated rest between each ambulation trial with one less seated rest needed today.  Music to assist with motivating pt and chair to follow for fatigue. Cues for posture, position in Rw and BOS   Stairs            Wheelchair Mobility    Modified Rankin (Stroke Patients Only)       Balance Overall balance assessment: Needs assistance   Sitting balance-Leahy Scale: Good       Standing balance-Leahy Scale: Fair                      Cognition Arousal/Alertness: Awake/alert Behavior During Therapy: WFL for tasks assessed/performed Overall Cognitive Status: Within Functional Limits for tasks assessed                      Exercises General Exercises - Lower Extremity Long Arc Quad: AROM;Both;Seated;15 reps Hip ABduction/ADduction: AAROM;10 reps;AROM;15 reps;Right;Left (AAROM on LLE) Hip Flexion/Marching: AROM;Both;15 reps;Seated    General Comments        Pertinent Vitals/Pain Pain Score: 8  Pain Location: right arm Pain Descriptors / Indicators: Aching;Sore Pain Intervention(s): Repositioned;Limited activity within patient's tolerance;Premedicated before session  HR 115-136 with gait, 120 at rest end of session    Home Living                      Prior Function            PT Goals (current goals can now be found in the care plan section) Progress towards PT goals: Progressing toward goals    Frequency       PT Plan Current plan remains appropriate     Co-evaluation             End of Session Equipment Utilized During Treatment: Gait belt;Oxygen Activity Tolerance: Patient tolerated treatment well Patient left: in chair;with call bell/phone within reach;with nursing/sitter in room     Time: 0915-0959 PT Time Calculation (min) (ACUTE ONLY): 44 min  Charges:  $Gait Training: 23-37 mins $Therapeutic Exercise: 8-22 mins                    G Codes:      Delorse Lekabor, Page Pucciarelli Beth 09/07/2014, 10:08 AM Delaney MeigsMaija Tabor Twania Bujak, PT [phone]

## 2014-09-07 NOTE — Progress Notes (Addendum)
[facility]- they state that pt has been able to sit for the past 2 sessions and are able to begin clipping process.  CSW has initiated bed search for SNF in case pt is unable to be admitted to CIR- bed offers pending  CSW will continue to follow.  Merlyn LotJenna Holoman, LCSWA Clinical Social Worker [phone]

## 2014-09-07 NOTE — Progress Notes (Addendum)
PROGRESS NOTE  [name] FMB:[id number] DOB: Apr 11, 1955 DOA: 06/17/2014 PCP: [name], MD  Brief Narrative: 59 yo ? smoker with hx HTN, COPD, CKD IV followed @ [facility]-[name] Page, failed L AV fistula, initially admitted 3/16 with flu and acute hypoxic resp failure. Ultimately tx to [facility] due to need for HD. L sided weakness 3/18 and found to have R ACA stroke-showed acute infarct R ACA (3/19). Patient more lethargic 3/30 and CT abd=perf viscus 3/30. She was taken to the OR for ex lap for perforated pyloric ulcer with diffuse peritonitis, ischemic necrosis of mid ileum. Patient developed septic shock on 4-1, she was started on pressors and wean off pressors on 4-3. Subsequently, she was diagnosed with pelvic abscess by CT scan perform on 4-7. She underwent pelvic drain placement by IR on 4/8= culture neg 08/15/14 another drained placed in the pelvic cul-de-sac abscess with culture = pseudomonas. On 08/20/14, Gluteal drain was inadvertently pulled out. Repeat CT scan shows improvement in the fluid collection so decision was to leave it out and continue IV abx. Pt hospital stay also complicated by high output duodenal (enterocutaneous fistula) fistula. As a result, pt was made npo and started on TPN. Now stable, on TNA +high output from fistula/leak per [facility]. Had a G_J tube placed by Dr. [name] of IR and Gen surgery has continued to assist with management of her multiple surgical comorbidities C. diff diagnosed 4-12-initially unresponsive to flagyl, and she was started on PO vancomycin 4-16 which she finished 3 week.  5/27--TPN weaned off and increasing TF.  06/24/14 Develops run of SVT, atrial tachycardia. Cardiology was helping with management. Course complicated with high RV pressure and Cor Pulmonale; 4/27 CT angio negative for PE. On 5/2 she developed hypoxemia, hypotension and SVT during dialysis treatment. Dialysis was stopped. She received IV bolus. She was started on BIPAP.  She was transferred to Step down unit. PCCM and cardiology re consulted. Her Hr decreased, BP improved. She was taken off BIPAP. This recurred on 5/15 with episodic SVT. Since then she has had intermitten short episodes of SVT, but on 08/28/14 she has had more sustained SVT, therefore; cardiology was reconsulted on on 08/29/14.  Assessment/Plan: Status post exploratory laparotomy with closure of perforated pyloric ulcer/abdominal abscess / small bowel resection w/ postop ileus and now with enterocutaneous fistula. Pelvic abscess s/p percutaneous drain placement  -completed, long courses of IV  Abx including Vanc and FLuconazole -Due to Fistula, she was made NPO, TPN was started -Fistula study done shows JP drain connection to the duodenal bulb-5-02. - 08/15/2014 repeat CT abdomen with 3.0 x 3.5 x 5.7 cm collection in the pelvic cul-de-sac and extraluminal air alongside JP drain in anterior abd wall -had high output from Fistula/leak. Had G_J placed by IR 08/12/14 at request of gen surgery -CT repeat shows 5 cm post cul-de-sac abscess which was drained on 5/16-culture revealed pseudomonas aeruginosa -ID was consulted as patient had Pseudomonas growing from her culture wounds and they started back Flagyl as well as Elita Quick on 5/16  -Per ID recommendation--plan D#21/21 days of Elita Quick and D#21/ 28 days metronidazole, stop [place] after todays dose -Patient inadvertently pulled out her gluteal/cul-de-sac drain May 19. Seen by interventional radiology and they recommended repeat CT scan, which was done. Shows that the fluid collection has decreased in size. They do not plan to replace the drain at this time. Continue with antibiotics alone.  -08/21/2014 CT scan did suggest  a transmural defect in the stomach wall near the patient's pyloric Cheree Ditto patch. -Patient accidentally pulled out her JP drain on 5/22. This has been replaced by surgery on 5/23. -Octreotide was discontinued 5/23--enterocutaneous fistula  output appears to be decreasing gradually -finished TPN on 08/27/14 -Enteral Feeding was increased 5/27 to 55 mL per hour--tolerating without residuals -08/29/14--started clear liquids-->tolerating well -08/31/14--pt inadvertently pulled out R-side JP drain again (3rd time) during therapy + GJ tube with bloody drainage--contacted general surgery 09/01/14--IR replaced RUQ drain (entercutaneous fistula)--reminded pt to wear abd binder at all times; no output from this drain 6/1: started full liquids  6/3: started regular diet: ATE a Hamburger!!-Huge milestone as far as patient is concerned DC to CIR when stable per [facility], does she need repeat  CT of abd  PSVT --Atrial tachycardia -Felt to be secondary to fluid metabolic shifts from ESRD and ongoing abd issues; also has severe anxiety with dialysis as well as COPD/Cor Pulmonale  -was on IV metoprolol and amiodarone, now 5/31 switched to PO  -CTA 4/27, negative for PE  -Cards following peripherally  ESRD new, now on HD this admit complicated by intermittent hypotension AKI on CKD 4, due to sepsis, shock Ongoing hemodialysis per Nephrology - perm-cath placed 3/26 d/t AVF problems Patient AV fistula reviewed and not mature yet per Dr. Paulene Floor need to revisit prior to d/c -TPN weaned off 08/27/14 -Continue hemodialysis Tuesday, Thursday, Saturday while the patient is in the hospital -Xanax with each HD due to significant anxiety with HD -starting to sit up with HD now, BPs low will not be able to tolerate much volume removal  C. difficile colitis still on contact precautions -C diff positive on 4-12, Received 4 days of flagyl without improvement, then started on PO vanc -Completed 3 weeks of Oral Vanc-stopped 5/7, this was 1 week after IV Zosyn was stopped 5/5 -5/28--having loose stools again-->Cdiff PCR negative -keep on contact precautions, now on Po flagyl till 6/13  ACA CVA with Lt sided weakness -06/23/14--TEE ;normal LV function; no  LAA thrombus -Started on ASA 81mg  per Neuro; resumed this -Will need ongoing long term rehab - PT/OT to cont to follow while inpatient -Patient remains very weak and deconditioned. 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 Fluids/volume managed with hemodialysis.  Elevated PA pressures on 2-D echo; CTA negative for PE - 07/23/2014 echo-- EF 60-61%, grade 1 diastolic dysfunction, Severe RV dilatation, PAP 90  Small PFO Has been evaluated by Cardiology. No intervention planned currently.  Anemia of critical illness/chronic disease -and bleeding from leak/fistula which has resolved. -s/p 2U PRBC 4/13 and 5/10 and 5/26 -Aranesp/iron per renal -transfuse for hemoglobin less than 7  Acute hypoxic Respiratory Failure:  -This was secondary to H1N1, hypervolemia, HCAP >> resolved.  -also has COPD and Cor Pulmonale - now on scheduled Xopenex and Atrovent, change to PRN  - presently stable on 2 L-3L  Thrombocytopenia; resolved.  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 TSH in 7 range -continue low dose synthroid given PSVT, changed to  PO synthroid  L Renal mass  -noted on [place], Indeterminate 1.9 cm hypoechoic mass off the interpolar region of the left kidney -may potentially represent a cyst however solid mass is not excluded -needs outpatient FU for this  Fall on May 18  DVT prophylaxis: SCDs Code Status: FULL Family Communication: Discussed with patient. No family at bedside. Disposition  Plan: Tx to tele,  Not accepted to LTAC, CIR when stable per [facility]  Consultants: Cardiology Nephrology PCCM Gen Surgery  Palliative care    Procedures/Studies: Ct Abdomen Pelvis Wo Contrast  08/22/2014   CLINICAL DATA:  Abdominal abscess. No intravenous contrast due to renal failure.  EXAM: CT ABDOMEN AND PELVIS WITHOUT CONTRAST  TECHNIQUE: Multidetector CT imaging of the abdomen and pelvis was performed following the standard protocol without IV contrast.  COMPARISON:  08/15/2014  FINDINGS: BODY WALL: Open laparotomy without fluid collection.  LOWER CHEST: Chronic triangular opacity in the posterior costophrenic sulcus on the left, atelectasis versus scarring  ABDOMEN/PELVIS:  Liver: No focal abnormality.  Biliary: High-density material within the gallbladder showing mild wall thickening which is chronic.  Pancreas: Unremarkable.  Spleen: Unremarkable.  Adrenals: Unremarkable.  Kidneys and ureters: No hydronephrosis or stone. Presumed 1 cm cyst from the interpolar left kidney  Bladder: Decompressed.  No pathologic findings.  Reproductive: Calcified and noncalcified uterine fibroids, better seen on previous enhanced imaging.  Bowel: Right upper quadrant drain related to perforated pyloric ulcer status post Cheree Ditto patch. Specially visible on coronal imaging, there is a gas channel through the pylorus measuring 6 to 9 mm in diameter. No bowel obstruction. Enteroenterostomy noted in the pelvis. Oral contrast again seen in the colon. No notable colonic wall thickening in this patient with history of C difficile. Negative appendix. Percutaneous gastrojejunostomy tube is in good position.  Retroperitoneum: No mass or adenopathy.  Peritoneum: Known thick walled collection in the rectovaginal recess smaller than 08/15/2014 at 40 x 16 mm as compared to 51 x 32 mm. No new collection is identified. No free pneumoperitoneum.  Vascular: 3 cm fusiform infrarenal aortic aneurysm.  OSSEOUS: No acute abnormalities.   IMPRESSION: 1. Near the patient's pyloric Cheree Ditto patch is a transmural defect in the stomach wall, contiguous with the surgical drain. If repeat surgery is considered, oral contrast could confirm an open ulcer. 2. Decreased rectovaginal recess fluid collection, now 4 x 1.5 cm (previously 5 x 3 cm).   Electronically Signed   By: Marnee Spring M.D.   On: 08/22/2014 01:49   Ct Abdomen Pelvis Wo Contrast  08/15/2014   CLINICAL DATA:  Intra abdominal abscess.  EXAM: CT ABDOMEN AND PELVIS WITHOUT CONTRAST  TECHNIQUE: Multidetector CT imaging of the abdomen and pelvis was performed following the standard protocol without IV contrast.  COMPARISON:  CT scan of Aug 03, 2014.  FINDINGS: Severe degenerative disc disease is noted at L5-S1. Minimal subsegmental atelectasis is noted posteriorly in the left lung base.  No gallstones are noted. No focal abnormality is noted in the liver, spleen or pancreas on these unenhanced images. Adrenal glands appear normal. Stable exophytic cyst is seen arising from midpole of left kidney. No hydronephrosis or renal obstruction is noted. No renal or ureteral calculi are noted. 3 cm infrarenal abdominal aortic aneurysm is noted. Gastrojejunostomy tube is seen entering stomach percutaneously and with distal tip in the jejunum. JP surgical drain is seen entering right upper quadrant of abdomen underneath the liver with distal tip in the left upper quadrant. No significant fluid collection is noted around  the drain. Fluid collection is again identified and posterior cul-de-sac of pelvis measuring 5.1 x 3.2 cm. Calcified degenerating fibroid is noted in the uterus. Urinary bladder is unremarkable. There is no evidence of bowel obstruction. The appendix appears normal. No significant adenopathy is noted. Large midline surgical anterior abdominal wall incision is noted.  IMPRESSION: Stable 3 cm infrarenal abdominal aortic aneurysm.  No change in position of JP surgical drain with distal tip in  the left upper quadrant of the abdomen.  Interval placement of percutaneous gastrojejunostomy tube with distal tip in the jejunum.  Grossly stable size and appearance of 5 cm fluid collection seen in posterior cul-de-sac of the pelvis.  Continued presence uterine fibroids.   Electronically Signed   By: Lupita Raider, M.D.   On: 08/15/2014 15:15   Dg Chest 1 View  08/13/2014   CLINICAL DATA:  Shortness of breath  EXAM: CHEST  1 VIEW  COMPARISON:  08/03/2014  FINDINGS: Moderately severe cardiac enlargement. Central line in unchanged position on the right and left side. Mild vascular congestion with no evidence of edema effusion or consolidation.  IMPRESSION: No acute findings   Electronically Signed   By: Esperanza Heir M.D.   On: 08/13/2014 11:04   Dg Chest 2 View  09/01/2014   CLINICAL DATA:  Followup respiratory failure  EXAM: CHEST  2 VIEW  COMPARISON:  08/23/2014  FINDINGS: Moderate cardiac enlargement. Left IJ catheter is identified with tip in the SVC. There is a right-sided dialysis catheter with tips in the SVC. No pleural effusion or edema. No airspace consolidation.  IMPRESSION: 1. Lungs remain clear. 2. Catheters positioned as above.   Electronically Signed   By: Signa Kell M.D.   On: 09/01/2014 09:04   Ir Gastrostomy Tube Mod Sed  08/12/2014   CLINICAL DATA:  Perforated duodenal ulcer, post g patch with persistent leak. Percutaneous gastrojejunostomy as requested for gastric decompression and small bowel enteral feeding support.  EXAM: PERC PLACEMENT GASTROSTOMY;  CONVERT G-TUBE TO G-JTUBE  FLUOROSCOPY TIME:  16 minutes 24 seconds, 1016.6 mGy  TECHNIQUE: The procedure, risks, benefits, and alternatives were explained to the patient. Questions regarding the procedure were encouraged and answered. The patient understands and consents to the procedure. As antibiotic prophylaxis, cefazolin 2 g was ordered pre-procedure and administered intravenously within one hour of incision. . A 5 French  angiographic catheter was placed as orogastric tube. The upper abdomen was prepped with Betadine, draped in usual sterile fashion, and infiltrated locally with 1% lidocaine. 1 mg glucagon was administered intravenously to facilitate gastric distention and slow peristalsis. Stomach was insufflated using air through the orogastric tube. An 63 French sheath needle was advanced percutaneously into the gastric lumen under fluoroscopy. Gas could be aspirated and a small contrast injection confirmed intraluminal spread. The sheath was exchanged over a guidewire for a 9 [place] vascular sheath, through which the snare device was advanced and used to snare a guidewire passed through the orogastric tube. This was withdrawn, and the snare attached to the 20 French pull-through gastrostomy tube, which was advanced antegrade, positioned with the internal bumper securing the anterior gastric wall to the anterior abdominal wall. Small contrast injection confirms appropriate positioning. The external bumper was applied and the catheter was flushed.  A 5 French angiographic catheter was placed coaxially through the gastrostomy tube and used to direct an angled stiff glidewire across the pylorus, beyond the proximal duodenal perforation, and into the proximal jejunum just beyond the ligament of Treitz. Over  this, a coaxial jejunostomy feeding adapter was advanced. Contrast injection confirmed patency and appropriate position of the gastric and jejunal lumens. No extravasation. The lumens were flushed with saline and capped. The patient tolerated the procedure well.  COMPLICATIONS: COMPLICATIONS none  IMPRESSION: 1. Technically successful 20 French pull-through gastrostomy placement under fluoroscopy. 2. Technically successful coaxial jejunostomy feeding adapter placement to the ligament of Treitz.   Electronically Signed   By: Corlis Leak  Hassell M.D.   On: 08/12/2014 16:12   Ir Catheter Tube Change  09/01/2014   CLINICAL DATA:  History of  a duodenal fistula. Surgical drain recently fell out and request for drain replacement.  EXAM: REPLACEMENT OF PERCUTANEOUS DRAIN WITH FLUOROSCOPY  Physician: Rachelle HoraAdam R. Henn, MD  FLUOROSCOPY TIME:  3 minutes, 246 mGy  MEDICATIONS AND MEDICAL HISTORY: None  ANESTHESIA/SEDATION: Moderate sedation time: None  CONTRAST:  15 mL Omnipaque-300  PROCEDURE: The procedure was explained to the patient. The risks and benefits of the procedure were discussed and the patient's questions were addressed. Informed consent was obtained from the patient. The right upper abdomen was prepped and draped in sterile fashion. Maximal barrier sterile technique was utilized including caps, mask, sterile gowns, sterile gloves, sterile drape, hand hygiene and skin antiseptic. A 5 French catheter was advanced through the old drain site. Contrast was injected as the catheter was advanced into the tissue. Contrast injection demonstrated a connection to the duodenum bulb and stomach. Catheter and wire preferentially went into the stomach via the duodenal bulb. A series of wires were used to advance a 12 French [employer] catheter into the abdomen. The tip of the catheter was cut in order to advance the catheter over a wire. The tip was placed adjacent to the duodenal fistula but not within the bowel. Catheter was sutured to the skin. Catheter was attached to gravity bag.  FINDINGS: Focal fistula connection between the drain tract and the duodenal bulb. New 12 French catheter was placed within the fistula tract but outside of the bowel.  Estimated blood loss: Minimal  COMPLICATIONS: None  IMPRESSION: Successful replacement of the percutaneous drain. Demonstration of the duodenal bulb fistula. Drain was placed adjacent to the fistula opening but outside of the bowel.   Electronically Signed   By: Richarda OverlieAdam  Henn M.D.   On: 09/01/2014 13:39   Ir Adele SchilderGastr Tamsen Sniderube Convert Gastr-jej Per W/fl Mod Sed  08/12/2014   CLINICAL DATA:  Perforated duodenal ulcer,  post g patch with persistent leak. Percutaneous gastrojejunostomy as requested for gastric decompression and small bowel enteral feeding support.  EXAM: PERC PLACEMENT GASTROSTOMY;  CONVERT G-TUBE TO G-JTUBE  FLUOROSCOPY TIME:  16 minutes 24 seconds, 1016.6 mGy  TECHNIQUE: The procedure, risks, benefits, and alternatives were explained to the patient. Questions regarding the procedure were encouraged and answered. The patient understands and consents to the procedure. As antibiotic prophylaxis, cefazolin 2 g was ordered pre-procedure and administered intravenously within one hour of incision. . A 5 French angiographic catheter was placed as orogastric tube. The upper abdomen was prepped with Betadine, draped in usual sterile fashion, and infiltrated locally with 1% lidocaine. 1 mg glucagon was administered intravenously to facilitate gastric distention and slow peristalsis. Stomach was insufflated using air through the orogastric tube. An 5918 French sheath needle was advanced percutaneously into the gastric lumen under fluoroscopy. Gas could be aspirated and a small contrast injection confirmed intraluminal spread. The sheath was exchanged over a guidewire for a 9 French vascular sheath, through which the snare device was  advanced and used to snare a guidewire passed through the orogastric tube. This was withdrawn, and the snare attached to the 20 French pull-through gastrostomy tube, which was advanced antegrade, positioned with the internal bumper securing the anterior gastric wall to the anterior abdominal wall. Small contrast injection confirms appropriate positioning. The external bumper was applied and the catheter was flushed.  A 5 French angiographic catheter was placed coaxially through the gastrostomy tube and used to direct an angled stiff glidewire across the pylorus, beyond the proximal duodenal perforation, and into the proximal jejunum just beyond the ligament of Treitz. Over this, a coaxial  jejunostomy feeding adapter was advanced. Contrast injection confirmed patency and appropriate position of the gastric and jejunal lumens. No extravasation. The lumens were flushed with saline and capped. The patient tolerated the procedure well.  COMPLICATIONS: COMPLICATIONS none  IMPRESSION: 1. Technically successful 20 French pull-through gastrostomy placement under fluoroscopy. 2. Technically successful coaxial jejunostomy feeding adapter placement to the ligament of Treitz.   Electronically Signed   By: Corlis Leak M.D.   On: 08/12/2014 16:12   Dg Chest Port 1 View  08/23/2014   CLINICAL DATA:  Left chest pain  EXAM: PORTABLE CHEST - 1 VIEW  COMPARISON:  08/18/2014  FINDINGS: Patient is rotated.  Lungs are essentially clear. No focal consolidation. No pleural effusion or pneumothorax.  Cardiomegaly.  Left IJ venous catheter terminates at the cavoatrial junction. Right IJ dual lumen dialysis catheter terminates in the lower SVC.  IMPRESSION: No evidence of acute cardiopulmonary disease.   Electronically Signed   By: Charline Bills M.D.   On: 08/23/2014 19:17   Dg Chest Port 1 View  08/18/2014   CLINICAL DATA:  Shortness of breath at rest.  Having hemodialysis.  EXAM: PORTABLE CHEST - 1 VIEW  COMPARISON:  08/15/2014.  FINDINGS: The cardiac silhouette remains mildly enlarged. Stable right jugular double-lumen catheter. Clear lungs. The pulmonary vasculature remains prominent. No pleural fluid. Minimal right shoulder degenerative changes.  IMPRESSION: Stable cardiomegaly and pulmonary vascular congestion.   Electronically Signed   By: Beckie Salts M.D.   On: 08/18/2014 09:47   Dg Chest Port 1 View  08/15/2014   CLINICAL DATA:  Shortness of breath.  EXAM: PORTABLE CHEST - 1 VIEW  COMPARISON:  08/13/2014 and multiple prior chest radiographs  FINDINGS: Cardiomegaly and pulmonary vascular congestion again noted.  A right IJ central venous catheter is noted with tips overlying the upper and mid SVC.  A left  central venous catheter is present with tip overlying the lower SVC.  There is no evidence of focal airspace disease, pulmonary edema, suspicious pulmonary nodule/mass, pleural effusion, or pneumothorax. No acute bony abnormalities are identified.  IMPRESSION: Cardiomegaly with pulmonary vascular congestion.   Electronically Signed   By: Harmon Pier M.D.   On: 08/15/2014 17:28   Dg Abd Portable 1v  08/23/2014   CLINICAL DATA:  JP drain, broken, initial encounter  EXAM: PORTABLE ABDOMEN - 1 VIEW  COMPARISON:  CT abdomen pelvis dated 08/21/2014  FINDINGS: A portion of the right flank is excluded from this single image.  The patient's right abdominal surgical drain is not visualized.  Gastrojejunostomy in satisfactory position.  IMPRESSION: The patient's right abdominal surgical drain is not visualized.  Gastrojejunostomy in satisfactory position.   Electronically Signed   By: Charline Bills M.D.   On: 08/23/2014 19:11   Ct Image Guided Fluid Drain By Catheter  08/17/2014   CLINICAL DATA:  Postoperative intra-abdominal abscess, subsequent encounter. Small  pelvic fluid collection.  EXAM: CT-GUIDED DRAIN PLACEMENT IN PELVIC FLUID COLLECTION  Physician: Rachelle Hora. Lowella Dandy, MD  FLUOROSCOPY TIME:  None  MEDICATIONS: 50 mcg fentanyl  ANESTHESIA/SEDATION: Patient was monitored by a radiology nurse during the procedure.  PROCEDURE: Informed consent was obtained for drain placement. Patient was placed on her left side. Images through the pelvis were obtained. The right buttock was prepped and draped in sterile fashion. 1% lidocaine was used for a local anesthetic. An 18 gauge needle was directed into the pelvic fluid collection with CT guidance from a transgluteal approach. Thick yellow purulent fluid was aspirated. A stiff Amplatz wire was placed. The tract was dilated to accommodate a 10.2 [place] multipurpose drain. It was technically difficult to advance the drain into the collection due to the small size of the  collection and the patient's body habitus. Eventually, the drain was successfully placed within the collection. 5 mL of yellow purulent fluid was obtained. Catheter was sutured to the skin and attached to a suction bulb. Fluid was sent for culture.  FINDINGS: Small fluid collection just posterior to the uterus. A total of 5 mL of yellow purulent fluid was removed.  Estimated blood loss: Minimal  COMPLICATIONS: None  IMPRESSION: CT-guided placement of a drainage catheter in the small pelvic fluid collection.   Electronically Signed   By: Richarda Overlie M.D.   On: 08/17/2014 17:21        Subjective: Feels ok, Mild chronic abd pain, unchanged, walked in unit with PT  Objective: Filed Vitals:   09/07/14 0859 09/07/14 0912 09/07/14 1004 09/07/14 1239  BP:  123/83  101/68  Pulse:  107 120 103  Temp:    98.4 F (36.9 C)  TempSrc:    Axillary  Resp:    21  Height:      Weight:      SpO2: 94%   99%    Intake/Output Summary (Last 24 hours) at 09/07/14 1347 Last data filed at 09/07/14 1147  Gross per 24 hour  Intake    970 ml  Output      0 ml  Net    970 ml   Weight change:  Exam:   General:  Pt is alert, follows commands appropriately, not in acute distress, chronically ill appearing, seen sitting in recliner  HEENT: No icterus,  [place]/AT  Cardiovascular: RRR, S1/S2, no rubs, no gallops  Respiratory: basilar ronchi, Good air movement.  Abdomen: Soft/+BS, non tender, non distended, no guarding, 2drains noted, L sided drain with mod amount of greenish output and R sided drain without any output  Extremities: trace LE edema, No lymphangitis, No petechiae, No rashes, no synovitis  Data Reviewed: Basic Metabolic Panel:  Recent Labs Lab 09/01/14 1430 09/03/14 0350 09/04/14 0430 09/05/14 0532 09/06/14 1230  NA 142 137 135 135  --   K 3.7 4.2 4.3 4.5  --   CL 95* 97* 99* 94*  --   CO2 --   GLUCOSE 104* 93 92 97  --   BUN 43* 37* 19 33*  --   CREATININE 5.72* 4.67*  2.86* 4.32*  --   CALCIUM 8.2* 8.2* 7.7* 8.4*  --   MG  --   --   --   --  1.8   Liver Function Tests: No results for input(s): AST, ALT, ALKPHOS, BILITOT, PROT, ALBUMIN in the last 168 hours. No results for input(s): LIPASE, AMYLASE in the last 168 hours. No results for input(s):  AMMONIA in the last 168 hours. CBC:  Recent Labs Lab 09/01/14 1430 09/03/14 0350 09/04/14 0430 09/05/14 0532  WBC 11.1* 11.5* 9.9 11.1*  HGB 9.0* 9.3* 9.0* 9.2*  HCT 28.7* 29.7* 28.8* 29.4*  MCV 93.8 94.6 93.8 92.7  PLT 252 208 190 253   Cardiac Enzymes: No results for input(s): CKTOTAL, CKMB, CKMBINDEX, TROPONINI in the last 168 hours. BNP: Invalid input(s): POCBNP CBG:  Recent Labs Lab 09/01/14 2350  GLUCAP 97    Recent Results (from the past 240 hour(s))  Clostridium Difficile by PCR     Status: None   Collection Time: 08/31/14  2:35 PM  Result Value Ref Range Status   C difficile by pcr NEGATIVE NEGATIVE Final     Scheduled Meds: . ALPRAZolam  0.5 mg Oral Q T,Th,Sa-HD  . amiodarone  400 mg Oral BID  . antiseptic oral rinse  7 mL Mouth Rinse q12n4p  . aspirin  81 mg Oral Daily  . budesonide (PULMICORT) nebulizer solution  0.25 mg Nebulization BID  . cefTAZidime (FORTAZ)  IV  2 g Intravenous Q T,Th,Sa-HD  . chlorhexidine  15 mL Mouth Rinse BID  . darbepoetin (ARANESP) injection - DIALYSIS  200 mcg Intravenous Q Thu-HD  . feeding supplement (RESOURCE BREEZE)  1 Container Oral TID BM  . feeding supplement (VITAL AF 1.2 CAL)  1,000 mL Per Tube Q24H  . ipratropium  0.5 mg Nebulization TID  . levothyroxine  25 mcg Oral QAC breakfast  . metoprolol tartrate  12.5 mg Oral BID  . metronidazole  500 mg Intravenous Q8H  . multivitamin  1 tablet Oral QHS  . sodium chloride  10-40 mL Intracatheter Q12H   Continuous Infusions: . sodium chloride 10 mL/hr at 08/19/14 0950     Zannie Cove, MD  [facility] Pager [phone]  If 7PM-7AM, please contact  night-coverage [email or web address] Password [email or web address] 09/07/2014, 1:47 PM   LOS: 82 days

## 2014-09-07 NOTE — Progress Notes (Signed)
[name], female   DOB: December 06, 1955, 59 y.o.   MRN: [id number]     [facility]      [address]   [place], [place] [id number]    Phone: [phone] FAX: [phone]     Subjective: Very cheerful Sore around g tube site.  Afebrile.  Tolerating POs.   Objective:  Vital signs:  Filed Vitals:   09/07/14 0400 09/07/14 0441 09/07/14 0457 09/07/14 0600  BP: 123/79 123/79  108/71  Pulse: 114 113  103  Temp:  97.7 F (36.5 C)    TempSrc:  Oral    Resp: 27 25  22   Height:      Weight:  96.208 kg (212 lb 1.6 oz)    SpO2: 99% 93% 97% 97%    Last BM Date: 09/06/14  Intake/Output   Yesterday:  06/05 0701 - 06/06 0700 In: 1180 [P.O.:240; NG/GT:540; IV Piggyback:400] Out: 0  This shift:    I/O last 3 completed shifts: In: 1840 [P.O.:240; NG/GT:1200; IV Piggyback:400] Out: 300 [Drains:300]    Physical Exam: General: Pt awake/alert/oriented x4 in no acute distress  Abdomen: Soft.  Nondistended.  ttp midline wound, GJ tube.  Midline wound with fibrinous exudate at the base of the wound, otherwise clean.  GJ tube is intact, G tube was to gravity and J tube had TF that were turned off.  The drain was disconnected from the bag, coiled and under the dressing.   No evidence of peritonitis.  No incarcerated hernias.    Problem List:   Principal Problem:   Acute respiratory failure with hypoxia Active Problems:   Cerebral thrombosis with cerebral infarction   Hypertension   ESRD needing dialysis   Obesity (BMI 30-39.9)   Depression   Left renal mass   Chronic diastolic heart failure   H1N1 influenza   Tobacco use disorder   Renal failure (ARF), acute on chronic   Hyperlipidemia   PSVT (paroxysmal supraventricular tachycardia)   SOB (shortness of breath)   Perforated gastric ulcer   Abnormal TSH   History of intermediate V/Q scan   HIT (heparin-induced thrombocytopenia)   Fistula   Abdominal pain    Palliative care encounter   ESRD (end stage renal disease)   SVT (supraventricular tachycardia)   Right heart failure   Duodenal anastomotic leak   Protein calorie malnutrition   Itching   Aortic aneurysm   Protein-calorie malnutrition   Sepsis   Duodenal fistula    Results:   Labs: Results for orders placed or performed during the hospital encounter of 06/17/14 (from the past 48 hour(s))  Magnesium     Status: None   Collection Time: 09/06/14 12:30 PM  Result Value Ref Range   Magnesium 1.8 1.7 - 2.4 mg/dL    Imaging / Studies: No results found.  Medications / Allergies:  Scheduled Meds: . ALPRAZolam  0.5 mg Oral Q T,Th,Sa-HD  . amiodarone  400 mg Oral BID  . antiseptic oral rinse  7 mL Mouth Rinse q12n4p  . aspirin  81 mg Oral Daily  . budesonide (PULMICORT) nebulizer solution  0.25 mg Nebulization BID  . cefTAZidime (FORTAZ)  IV  2 g Intravenous Q T,Th,Sa-HD  . chlorhexidine  15 mL Mouth Rinse BID  . darbepoetin (ARANESP) injection - DIALYSIS  200 mcg Intravenous Q Thu-HD  . feeding supplement (RESOURCE BREEZE)  1 Container Oral TID BM  . feeding supplement (VITAL AF 1.2 CAL)  1,000 mL Per  Tube Q24H  . ipratropium  0.5 mg Nebulization TID  . levothyroxine  25 mcg Oral QAC breakfast  . metoprolol tartrate  12.5 mg Oral BID  . metronidazole  500 mg Intravenous Q8H  . multivitamin  1 tablet Oral QHS  . sodium chloride  10-40 mL Intracatheter Q12H   Continuous Infusions: . sodium chloride 10 mL/hr at 08/19/14 0950   PRN Meds:.bisacodyl, camphor-menthol, diphenhydrAMINE, diphenhydrAMINE-zinc acetate, diphenoxylate-atropine, HYDROmorphone (DILAUDID) injection, levalbuterol, LORazepam, ondansetron (ZOFRAN) IV, sodium chloride  Antibiotics: Anti-infectives    Start     Dose/Rate Route Frequency Ordered Stop   08/27/14 1900  cefTAZidime (FORTAZ) 2 g in dextrose 5 % 50 mL IVPB     2 g 100 mL/hr over 30 Minutes Intravenous Every T-Th-Sa (Hemodialysis) 08/27/14 1846      08/27/14 1900  metroNIDAZOLE (FLAGYL) IVPB 500 mg     500 mg 100 mL/hr over 60 Minutes Intravenous Every 8 hours 08/27/14 1847     08/21/14 1430  cefTAZidime (FORTAZ) 1 g in dextrose 5 % 50 mL IVPB     1 g 100 mL/hr over 30 Minutes Intravenous  Once 08/21/14 1426 08/21/14 1547   08/18/14 1300  metroNIDAZOLE (FLAGYL) IVPB 500 mg  Status:  Discontinued     500 mg 100 mL/hr over 60 Minutes Intravenous 3 times per day 08/18/14 1045 08/27/14 1847   08/18/14 1200  cefTAZidime (FORTAZ) 2 g in dextrose 5 % 50 mL IVPB  Status:  Discontinued     2 g 100 mL/hr over 30 Minutes Intravenous Every T-Th-Sa (Hemodialysis) 08/18/14 1054 08/27/14 1846   08/12/14 1449  ceFAZolin (ANCEF) 2-3 GM-% IVPB SOLR    Comments:  Troy Sine   : cabinet override      08/12/14 1449 08/12/14 1629   08/12/14 1130  ceFAZolin (ANCEF) IVPB 2 g/50 mL premix    Comments:  Hang ON CALL to xray 5/11   2 g 100 mL/hr over 30 Minutes Intravenous To Radiology 08/12/14 1035 08/12/14 1520   08/04/14 1200  vancomycin (VANCOCIN) 50 mg/mL oral solution 125 mg  Status:  Discontinued     125 mg Oral 4 times per day 08/04/14 0851 08/08/14 1229   07/18/14 1800  vancomycin (VANCOCIN) 50 mg/mL oral solution 125 mg  Status:  Discontinued     125 mg Oral 4 times per day 07/18/14 1455 08/04/14 0845   07/17/14 1830  vancomycin (VANCOCIN) IVPB 750 mg/150 ml premix     750 mg 150 mL/hr over 60 Minutes Intravenous  Once 07/17/14 1818 07/18/14 0024   07/15/14 1400  metroNIDAZOLE (FLAGYL) tablet 500 mg  Status:  Discontinued     500 mg Oral 3 times per day 07/15/14 1202 07/18/14 1533   07/13/14 2200  piperacillin-tazobactam (ZOSYN) IVPB 2.25 g  Status:  Discontinued     2.25 g 100 mL/hr over 30 Minutes Intravenous 3 times per day 07/13/14 1318 07/24/14 1138   07/13/14 2000  fluconazole (DIFLUCAN) IVPB 200 mg  Status:  Discontinued     200 mg 100 mL/hr over 60 Minutes Intravenous Every 24 hours 07/13/14 1318 07/19/14 1027   07/07/14 1200   vancomycin (VANCOCIN) IVPB 1000 mg/200 mL premix  Status:  Discontinued     1,000 mg 200 mL/hr over 60 Minutes Intravenous Every 24 hours 07/07/14 0937 07/14/14 1712   07/06/14 2000  fluconazole (DIFLUCAN) IVPB 400 mg  Status:  Discontinued     400 mg 100 mL/hr over 120 Minutes Intravenous Every 24 hours 07/06/14 1507  07/13/14 1318   07/06/14 1800  piperacillin-tazobactam (ZOSYN) IVPB 2.25 g  Status:  Discontinued     2.25 g 100 mL/hr over 30 Minutes Intravenous 4 times per day 07/06/14 1505 07/13/14 1318   07/05/14 2000  fluconazole (DIFLUCAN) IVPB 200 mg  Status:  Discontinued     200 mg 100 mL/hr over 60 Minutes Intravenous Every 24 hours 07/05/14 0757 07/06/14 1507   07/05/14 1400  piperacillin-tazobactam (ZOSYN) IVPB 2.25 g  Status:  Discontinued     2.25 g 100 mL/hr over 30 Minutes Intravenous 3 times per day 07/05/14 0749 07/06/14 1505   07/02/14 1800  vancomycin (VANCOCIN) IVPB 1000 mg/200 mL premix  Status:  Discontinued     1,000 mg 200 mL/hr over 60 Minutes Intravenous Every 24 hours 07/01/14 1858 07/05/14 0801   07/01/14 2200  piperacillin-tazobactam (ZOSYN) IVPB 3.375 g  Status:  Discontinued     3.375 g 100 mL/hr over 30 Minutes Intravenous 4 times per day 07/01/14 1858 07/05/14 0749   07/01/14 2000  fluconazole (DIFLUCAN) IVPB 400 mg  Status:  Discontinued     400 mg 100 mL/hr over 120 Minutes Intravenous Every 24 hours 07/01/14 1858 07/05/14 0757   07/01/14 1400  fluconazole (DIFLUCAN) IVPB 200 mg  Status:  Discontinued     200 mg 100 mL/hr over 60 Minutes Intravenous Every 24 hours 07/01/14 1330 07/01/14 1853   07/01/14 1000  piperacillin-tazobactam (ZOSYN) IVPB 2.25 g  Status:  Discontinued     2.25 g 100 mL/hr over 30 Minutes Intravenous Every 8 hours 07/01/14 0952 07/01/14 1853   07/01/14 1000  vancomycin (VANCOCIN) 500 mg in sodium chloride 0.9 % 100 mL IVPB     500 mg 100 mL/hr over 60 Minutes Intravenous  Once 07/01/14 0952 07/01/14 1126   06/30/14 1200   vancomycin (VANCOCIN) IVPB 1000 mg/200 mL premix     1,000 mg 200 mL/hr over 60 Minutes Intravenous  Once 06/30/14 0944 06/30/14 1403   06/30/14 1200  ceFEPIme (MAXIPIME) 2 g in dextrose 5 % 50 mL IVPB     2 g 100 mL/hr over 30 Minutes Intravenous  Once 06/30/14 0944 06/30/14 1425   06/30/14 0100  vancomycin (VANCOCIN) 2,000 mg in sodium chloride 0.9 % 500 mL IVPB     2,000 mg 250 mL/hr over 120 Minutes Intravenous  Once 06/30/14 0048 06/30/14 0525   06/30/14 0100  ceFEPIme (MAXIPIME) 2 g in dextrose 5 % 50 mL IVPB     2 g 100 mL/hr over 30 Minutes Intravenous  Once 06/30/14 0048 06/30/14 0322   06/27/14 0600  cefUROXime (ZINACEF) 1.5 g in dextrose 5 % 50 mL IVPB     1.5 g 100 mL/hr over 30 Minutes Intravenous On call to O.R. 06/26/14 1019 06/27/14 0630   06/19/14 1800  oseltamivir (TAMIFLU) capsule 30 mg     30 mg Oral Every M-W-F (1800) 06/19/14 1003 06/22/14 1841   06/18/14 1600  oseltamivir (TAMIFLU) capsule 30 mg  Status:  Discontinued     30 mg Oral Daily 06/18/14 1538 06/19/14 1003        Assessment/Plan: POD #68 s/p Exploratory Laparotomy with graham patch closure of perforated pyloric ulcer, SBR for ischemic bowel - 07/01/2014 - Derrell Lolling  Intra-abdominal (pelvic) fluid collection Duodenal fistula Unfortunately, the drain was clamped instead of the G tube which was clearly started in the orders.  I also spoke with the day nurse regarding this, but unfortunately this was done incorrectly.   i placed the  drain to gravity via a foley bag. There was a tiny amount of bilious output thus far.  Will monitor the output.  Will discuss with Dr. Andrey Campanile if any imaging is necessary. Abdominal tenderness is at baseline. Along with the nurse, we clamped her G tube.  I also changed her dressing.  -abdominal binder at all times  -BID wet to dry dressing changes VTE prophylaxis-SCD, HIT+ ID-wound infection, abscess, c diff.  ID has made antibiotics recs, ceftaz total 21 days and flagyl 1 week  post, follow recs. ESRD FEN-soft diet, TF at night through J tube.  If drain output is high, will make her NPO Dispo-given above findings, she needs to remain in the hospital for 24h to ensure no signigicant output from the drain.     Ashok Norris, [facility] Pager [phone]) For consults and floor pages call [phone](7A-4:30P)  09/07/2014 8:04 AM

## 2014-09-07 NOTE — Consult Note (Signed)
Vascular and Vein [facility] Consult  Reason for Consult: non maturing AVF Referring Physician:  Dr. Darrick Penna MRN #:  [id number]  History of Present Illness: This is a 59 y.o. female who we've been re-consulted for evaluation of her left brachial-cephalic AV fistula. Previous fistulogram in March revealed a proximal aneurysm and large competing branch. She was previously seen in consult by Dr. Darrick Penna on 08/14/2014. It was felt at that time best to defer any surgical revision until discharge from the hospital was imminent. She has had an lengthy hospital stay s/p CVA and perforated pyloric ulcer. She is currently dialyzing via a right IJ diatek catheter without complications.    She currently reports shortness of breath at rest that is worse when she goes to dialysis. She says she feels "scared" and "down" a lot of the times. She is having some abdominal soreness, but denies any nausea or vomiting. Full ROS below.   Past Medical History  Diagnosis Date  . Asthma   . Hypertension   . Gout   . Arthritis   . Insomnia   . Chronic kidney disease   . Depression   . Renal insufficiency   . Aortic aneurysm without rupture     3cm by CT 08/03/14  . Paroxysmal SVT (supraventricular tachycardia)   . Cor pulmonale   . Stroke 06/2014  . PFO (patent foramen ovale)   . COPD (chronic obstructive pulmonary disease)    Past Surgical History  Procedure Laterality Date  . Multiple tooth extractions    . Av fistula placement Left 01/28/2014    Procedure: ARTERIOVENOUS (AV) FISTULA CREATION;  Surgeon: Sherren Kerns, MD;  Location: [facility] OR;  Service: Vascular;  Laterality: Left;  . Tee without cardioversion N/A 06/23/2014    Procedure: TRANSESOPHAGEAL ECHOCARDIOGRAM (TEE);  Surgeon: Lewayne Bunting, MD;  Location[facility] ENDOSCOPY;  Service: Cardiovascular;  Laterality: N/A;  . Insertion of dialysis catheter Right 06/27/2014    Procedure: INSERTION OF Right Internal Jugular DIATEK CATHETER;   Surgeon: Pryor Ochoa, MD;  Location: [facility] OR;  Service: Vascular;  Laterality: Right;  . Laparotomy N/A 07/01/2014    Procedure: EXPLORATORY LAPAROTOMY WITH CLOSURE OF PERFORATED PYLORIC ULCER;  Surgeon: Claud Kelp, MD;  Location: [facility] OR;  Service: General;  Laterality: N/A;  . Bowel resection N/A 07/01/2014    Procedure: SMALL BOWEL RESECTION;  Surgeon: Claud Kelp, MD;  Location: [facility] OR;  Service: General;  Laterality: N/A;    Allergies  Allergen Reactions  . 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    Prior to Admission medications   Medication Sig Start Date End Date Taking? Authorizing Provider  allopurinol (ZYLOPRIM) 100 MG tablet Take 100 mg by mouth 2 (two) times daily as needed (for gout).   Yes Historical Provider, MD  citalopram (CELEXA) 20 MG tablet Take 20 mg by mouth daily.   Yes Historical Provider, MD  furosemide (LASIX) 80 MG tablet Take 80 mg by mouth daily.   Yes Historical Provider, MD  hydrOXYzine (ATARAX/VISTARIL) 25 MG tablet Take 25 mg by mouth 3 (three) times daily as needed for anxiety.   Yes Historical Provider, MD  oxyCODONE (ROXICODONE) 5 MG immediate release tablet Take 1 tablet (5 mg total) by mouth every 6 (six) hours as needed for severe pain. Patient not taking: Reported on 06/17/2014 01/28/14   Raymond Gurney, PA-C    History   Social History  . Marital Status: Single    Spouse Name: N/A  . Number of  Children: N/A  . Years of Education: N/A   Occupational History  . Not on file.   Social History Main Topics  . Smoking status: Current Every Day Smoker -- 0.50 packs/day for 20 years    Types: Cigarettes  . Smokeless tobacco: Never Used  . Alcohol Use: Yes     Comment: occ  . Drug Use: No  . Sexual Activity: Not on file   Other Topics Concern  . Not on file   Social History Narrative    Family History  Problem Relation Age of Onset  . Diabetes Mother   . Hypertension Mother   . Heart disease Mother   . Heart attack Mother   . Peripheral vascular  disease Mother   . Diabetes Father   . Heart disease Father   . Hypertension Father   . Diabetes Sister   . Hypertension Sister   . Heart disease Sister     before age 33  . Heart attack Sister   . Peripheral vascular disease Sister   . Heart disease Brother   . Hyperlipidemia Daughter     ROS:  Positive    Negative    All sytems reviewed and are negative  Cardiovascular:  chest pain/pressure  palpitations  SOB lying flat  DOE  pain in legs while walking  pain in legs at rest  pain in legs at night  non-healing ulcers  hx of DVT  swelling in legs  Pulmonary:  productive cough  asthma/wheezing  home O2  Neurologic:  weakness in  arms  legs  numbness in  arms  legs  hx of CVA  mini stroke difficulty speaking or slurred speech  temporary loss of vision in one eye  dizziness  Hematologic:  hx of cancer  bleeding problems  problems with blood clotting easily  Endocrine:    diabetes  thyroid disease  GI  vomiting blood  blood in stool  GU:  CKD/renal failure  HD--[]  M/W/F or  T/T/S  burning with urination  blood in urine  Psychiatric:  anxiety  depression  Musculoskeletal:  arthritis  joint pain  Integumentary:  rashes  ulcers  Constitutional:  fever  chills   Physical Examination  Filed Vitals:   09/07/14 0600  BP: 108/71  Pulse: 103  Temp:   Resp: 22   Body mass index is 34.25 kg/(m^2).  General: tachypneic but in NAD Gait: Not observed HENT: WNL, normocephalic Pulmonary: increased respiratory rate, no accessory muscle use, clear anterior Cardiac: tachycardic, nml s1 s2 Abdomen: abdominal binder in place, did not remove. Vascular Exam/Pulses: left upper arm AVF with palpable thrill and audible bruit, aneurysm at anastomosis. No degenerative skin changes or ulcerations. 1+ left radial pulse, 2+ right radial pulse, feet warm  bilaterally Extremities: without ischemic changes, without Gangrene , without cellulitis; without open wounds;  Musculoskeletal: no muscle wasting or atrophy  Neurologic: A&O X 3; Appropriate Affect, Speech is fluent/normal, 4/5 strength left upper and lower extremities, 5/5 strength right upper and lower   CBC    Component Value Date/Time   WBC 11.1* 09/05/2014 0532   WBC 6.8 10/02/2012 1046   RBC 3.17* 09/05/2014 0532   RBC 4.87 10/02/2012 1046   HGB 9.2* 09/05/2014 0532   HGB 13.1 10/02/2012 1046   HCT 29.4* 09/05/2014 0532   HCT 40.3 10/02/2012 1046   PLT 253 09/05/2014 0532   PLT 359 10/02/2012 1046   MCV 92.7 09/05/2014 0532   MCV 82.6 10/02/2012 1046  MCH 29.0 09/05/2014 0532   MCH 26.8 10/02/2012 1046   MCHC 31.3 09/05/2014 0532   MCHC 32.5 10/02/2012 1046   RDW 21.2* 09/05/2014 0532   RDW 17.4* 10/02/2012 1046   LYMPHSABS 1.3 08/24/2014 0520   LYMPHSABS 1.9 10/02/2012 1046   MONOABS 1.4* 08/24/2014 0520   MONOABS 0.8 10/02/2012 1046   EOSABS 0.5 08/24/2014 0520   EOSABS 0.2 10/02/2012 1046   BASOSABS 0.0 08/24/2014 0520   BASOSABS 0.1 10/02/2012 1046    BMET    Component Value Date/Time   NA 135 09/05/2014 0532   NA 139 10/02/2012 1046   K 4.5 09/05/2014 0532   K 4.4 10/02/2012 1046   CL 94* 09/05/2014 0532   CO2 26 09/05/2014 0532   CO2 25 10/02/2012 1046   GLUCOSE 97 09/05/2014 0532   GLUCOSE 110 10/02/2012 1046   BUN 33* 09/05/2014 0532   BUN 35.2* 10/02/2012 1046   CREATININE 4.32* 09/05/2014 0532   CREATININE 2.5* 10/02/2012 1046   CALCIUM 8.4* 09/05/2014 0532   CALCIUM 9.8 10/02/2012 1046   GFRNONAA 10* 09/05/2014 0532   GFRAA 12* 09/05/2014 0532    COAGS: Lab Results  Component Value Date   INR 1.28 08/14/2014   INR 1.19 08/12/2014   INR 1.21 07/10/2014      ASSESSMENT: This is a 59 y.o. female with proximal aneurysm and competing branch of left upper arm AV fistula.   Other active problems: s/p ex-lap and graham patch closure of  perforated pyloric ulcer, SBR for ischemic bowel POD 68, duodenal fistula, pelvic abscess s/p perc drain placment, PSVT, new ESRD, CVA, COPD, chronic diastolic CHF, DM 2  PLAN: Known aneurysm and competing branch of left upper arm fistula. Current dialyzing with right IJ diatek catheter without complication. Will need revision at some point. Still with complex wound care and per primary note unable to transfer out of SDU due to dyspnea and tachycardia. Discussed with Dr. Darrick PennaFields who will see patient and make decisions regarding timing of surgical revision.    Maris BergerKimberly Trinh, PA-C [facility] Office: [phone] Pager: [phone]    Pt significantly improved strength since seeing her several weeks ago.  Will try to schedule revision of fistula later this week on a non dialysis day Wednesday or Friday.  Fabienne Brunsharles Fields, MD [facility] [place] Office: [phone] Pager: [phone]

## 2014-09-07 NOTE — Progress Notes (Signed)
Subjective: Interval History: has complaints wants fistula fixed.  Objective: Vital signs in last 24 hours: Temp:  [97.5 F (36.4 C)-98.2 F (36.8 C)] 97.7 F (36.5 C) (06/06 0441) Pulse Rate:  [99-133] 103 (06/06 0600) Resp:  [16-32] 22 (06/06 0600) BP: (87-123)/(55-79) 108/71 mmHg (06/06 0600) SpO2:  [93 %-100 %] 97 % (06/06 0600) Weight:  [96.208 kg (212 lb 1.6 oz)] 96.208 kg (212 lb 1.6 oz) (06/06 0441) Weight change:   Intake/Output from previous day: 06/05 0701 - 06/06 0700 In: 1180 [P.O.:240; NG/GT:540; IV Piggyback:400] Out: 0  Intake/Output this shift: Total I/O In: 540 [NG/GT:540] Out: -   General appearance: alert and cooperative Chest wall: rales in bases, R IJPC,  L IJ central line Cardio: S1, S2 normal and systolic murmur: holosystolic 2/6, blowing at apex GI: PEG On L, Drain on R, bandage covering Extremities: edema 3+,  and AVF LUA B&T, aneurysm prox, tortuous  Lab Results:  Recent Labs  09/05/14 0532  WBC 11.1*  HGB 9.2*  HCT 29.4*  PLT 253   BMET:  Recent Labs  09/05/14 0532  NA 135  K 4.5  CL 94*  CO2 26  GLUCOSE 97  BUN 33*  CREATININE 4.32*  CALCIUM 8.4*   No results for input(s): PTH in the last 72 hours. Iron Studies: No results for input(s): IRON, TIBC, TRANSFERRIN, FERRITIN in the last 72 hours.  Studies/Results: No results found.  I have reviewed the patient's current medications.  Assessment/Plan: 1 ESRD for HD TTS .  Vol xs ,slowly remove.  Anxiety on HD, ?? Dialyzer vs psych vs other.   2 Thrombocytopenia resolved . Prob infx.  No evidence true HIT, give at at HD 3 Nutrit on TF, and po 4 Vol xs 5 Anemia check Fe 6 AVF get VVS to eval 7 Perf GU, peritonitis, abscess, drain .  Still drain,  On AB,  8 Anxiety 9SVT on Amio, metop,slowly taper metop with low bp 10 L renal mass 11 Pulm HTN 12 Obesity P HD, epo check FE, PTH,  Remove vol, ??? Duration AB, VVS to see    LOS: 82 days   [name] L 09/07/2014,6:48  AM

## 2014-09-07 NOTE — Progress Notes (Signed)
Nutrition Consult/Follow Up  DOCUMENTATION CODES:  Obesity unspecified  INTERVENTION:  Boost Breeze po TID, each supplement provides 250 kcal and 9 grams of protein  Nocturnal TF regimen: Vital AF 1.2 formula at 70 ml/hr x 12 hours (infuse 8 PM to 8 AM) to provide 1008 kcals, 63 gm protein, 681 ml of free water   NUTRITION DIAGNOSIS:  Inadequate oral intake related to  (limited appetite) as evidenced by meal completion < 50%, improved  GOAL:  Patient will meet greater than or equal to 90% of their needs, met  MONITOR:  PO intake, TF tolerance, Labs, Weight trends, I & O's  ASSESSMENT: Pt with CKD and hypertension, presented on 3/16 with shortness of breath and generalized weakness x 2 weeks. Pt found to have metabolic acidosis from worsening uremia, H1N1 flu. Pt started on HD. 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  Eating better.  Noted had a hamburger the other day.  PO intake 25% per flowsheet records.  RD consulted to change TF regimen to 8 AM to 8 PM.  Will need to increase rate to meet ~ 50% of estimated needs for 12 hours vs 15 hours.  Height:  Ht Readings from Last 1 Encounters:  08/02/14 _0  (1.676 m)    Weight:  Wt Readings from Last 1 Encounters:  09/07/14 212 lb 1.6 oz (96.208 kg)    Ideal Body Weight:  59.1 kg  Wt Readings from Last 10 Encounters:  09/07/14 212 lb 1.6 oz (96.208 kg)  06/01/14 225 lb (102.059 kg)  01/28/14 230 lb (104.327 kg)  01/21/14 231 lb 1.6 oz (104.826 kg)  10/02/12 216 lb 8 oz (98.204 kg)    BMI:  Body mass index is 34.25 kg/(m^2).  Estimated Nutritional Needs:  Kcal:  2100-2300  Protein:  120-140 gm  Fluid:  per MD  Skin:  Wound  (see comment) (stage 2 pressure ulcer to sacrum)  Diet Order:  DIET SOFT Room service appropriate?: Yes; Fluid consistency:: Thin  EDUCATION NEEDS:  Education needs no appropriate at this time   Intake/Output Summary (Last 24 hours) at 09/07/14 1151 Last data filed at 09/07/14 1147  Gross per 24 hour  Intake    890 ml  Output      0 ml  Net    890 ml    Last BM:  6/5  [name], RD, LDN Pager #: [phone] After-Hours Pager #: [phone]

## 2014-09-08 DIAGNOSIS — R109 Unspecified abdominal pain: Secondary | ICD-10-CM

## 2014-09-08 DIAGNOSIS — R7989 Other specified abnormal findings of blood chemistry: Secondary | ICD-10-CM

## 2014-09-08 DIAGNOSIS — K316 Fistula of stomach and duodenum: Secondary | ICD-10-CM

## 2014-09-08 DIAGNOSIS — I6330 Cerebral infarction due to thrombosis of unspecified cerebral artery: Secondary | ICD-10-CM

## 2014-09-08 DIAGNOSIS — K251 Acute gastric ulcer with perforation: Secondary | ICD-10-CM

## 2014-09-08 DIAGNOSIS — Z720 Tobacco use: Secondary | ICD-10-CM

## 2014-09-08 DIAGNOSIS — N2889 Other specified disorders of kidney and ureter: Secondary | ICD-10-CM

## 2014-09-08 LAB — BASIC METABOLIC PANEL
Anion gap: 17 — ABNORMAL HIGH (ref 5–15)
BUN: 41 mg/dL — ABNORMAL HIGH (ref 6–20)
CO2: 23 mmol/L (ref 22–32)
CREATININE: 5.29 mg/dL — AB (ref 0.44–1.00)
Calcium: 8.1 mg/dL — ABNORMAL LOW (ref 8.9–10.3)
Chloride: 90 mmol/L — ABNORMAL LOW (ref 101–111)
GFR calc Af Amer: 9 mL/min — ABNORMAL LOW (ref >60)
GFR calc non Af Amer: 8 mL/min — ABNORMAL LOW (ref >60)
GLUCOSE: 82 mg/dL (ref 65–99)
Potassium: 4.1 mmol/L (ref 3.5–5.1)
Sodium: 130 mmol/L — ABNORMAL LOW (ref 135–145)

## 2014-09-08 LAB — RENAL FUNCTION PANEL
Albumin: 2.1 g/dL — ABNORMAL LOW (ref 3.5–5.0)
Anion gap: 17 — ABNORMAL HIGH (ref 5–15)
BUN: 42 mg/dL — ABNORMAL HIGH (ref 6–20)
CO2: 23 mmol/L (ref 22–32)
CREATININE: 5.32 mg/dL — AB (ref 0.44–1.00)
Calcium: 8 mg/dL — ABNORMAL LOW (ref 8.9–10.3)
Chloride: 89 mmol/L — ABNORMAL LOW (ref 101–111)
GFR calc Af Amer: 9 mL/min — ABNORMAL LOW (ref >60)
GFR calc non Af Amer: 8 mL/min — ABNORMAL LOW (ref >60)
Glucose, Bld: 78 mg/dL (ref 65–99)
Phosphorus: 8.5 mg/dL — ABNORMAL HIGH (ref 2.5–4.6)
Potassium: 4.2 mmol/L (ref 3.5–5.1)
Sodium: 129 mmol/L — ABNORMAL LOW (ref 135–145)

## 2014-09-08 LAB — IRON AND TIBC
Iron: 37 ug/dL (ref 28–170)
Saturation Ratios: 23 % (ref 10.4–31.8)
TIBC: 160 ug/dL — AB (ref 250–450)
UIBC: 123 ug/dL

## 2014-09-08 LAB — CBC
HCT: 28.7 % — ABNORMAL LOW (ref 36.0–46.0)
HEMOGLOBIN: 9.2 g/dL — AB (ref 12.0–15.0)
MCH: 29.1 pg (ref 26.0–34.0)
MCHC: 32.1 g/dL (ref 30.0–36.0)
MCV: 90.8 fL (ref 78.0–100.0)
Platelets: 368 10*3/uL (ref 150–400)
RBC: 3.16 MIL/uL — ABNORMAL LOW (ref 3.87–5.11)
RDW: 19.6 % — ABNORMAL HIGH (ref 11.5–15.5)
WBC: 9.7 10*3/uL (ref 4.0–10.5)

## 2014-09-08 MED ORDER — SODIUM CHLORIDE 0.9 % IV SOLN: 100.0000 mL | INTRAVENOUS | Status: DC | PRN

## 2014-09-08 MED ORDER — ASPIRIN 81 MG PO CHEW: 81.0000 mg | CHEWABLE_TABLET | Freq: Every day | ORAL | Status: DC

## 2014-09-08 MED ORDER — ALLOPURINOL 100 MG PO TABS: 100.0000 mg | ORAL_TABLET | Freq: Every day | ORAL | Status: DC

## 2014-09-08 MED ORDER — NEPRO/CARBSTEADY PO LIQD
237.0000 mL | ORAL | Status: DC | PRN
  Filled 2014-09-08: qty 237

## 2014-09-08 MED ORDER — LIDOCAINE HCL (PF) 1 % IJ SOLN: 5.0000 mL | INTRAMUSCULAR | Status: DC | PRN

## 2014-09-08 MED ORDER — HEPARIN SODIUM (PORCINE) 1000 UNIT/ML DIALYSIS: 1000.0000 [IU] | INTRAMUSCULAR | Status: DC | PRN

## 2014-09-08 MED ORDER — ALPRAZOLAM 0.25 MG PO TABS
0.2500 mg | ORAL_TABLET | Freq: Every day | ORAL | Status: DC | PRN
  Administered 2014-09-08: 0.25 mg via ORAL
  Filled 2014-09-08: qty 1

## 2014-09-08 MED ORDER — ALBUTEROL SULFATE (2.5 MG/3ML) 0.083% IN NEBU
INHALATION_SOLUTION | RESPIRATORY_TRACT | Status: AC
  Administered 2014-09-08: 2.5 mg
  Filled 2014-09-08: qty 3

## 2014-09-08 MED ORDER — HEPARIN SODIUM (PORCINE) 1000 UNIT/ML DIALYSIS: 100.0000 [IU]/kg | INTRAMUSCULAR | Status: DC | PRN

## 2014-09-08 MED ORDER — DEXTROSE 5 % IV SOLN
1.5000 g | INTRAVENOUS | Status: AC
  Administered 2014-09-09: 1.5 g via INTRAVENOUS
  Filled 2014-09-08 (×2): qty 1.5

## 2014-09-08 MED ORDER — PENTAFLUOROPROP-TETRAFLUOROETH EX AERO: 1.0000 "application " | INHALATION_SPRAY | CUTANEOUS | Status: DC | PRN

## 2014-09-08 MED ORDER — BUDESONIDE 0.25 MG/2ML IN SUSP: 0.2500 mg | Freq: Two times a day (BID) | RESPIRATORY_TRACT | Status: DC

## 2014-09-08 MED ORDER — ANTICOAGULANT SODIUM CITRATE 4% (200MG/5ML) IV SOLN
5.0000 mL | Status: AC
  Administered 2014-09-08: 5 mL via INTRAVENOUS
  Filled 2014-09-08: qty 250

## 2014-09-08 MED ORDER — LIDOCAINE-PRILOCAINE 2.5-2.5 % EX CREA: 1.0000 "application " | TOPICAL_CREAM | CUTANEOUS | Status: DC | PRN

## 2014-09-08 MED ORDER — ALTEPLASE 2 MG IJ SOLR
2.0000 mg | Freq: Once | INTRAMUSCULAR | Status: DC | PRN
  Filled 2014-09-08: qty 2

## 2014-09-08 MED ORDER — IPRATROPIUM BROMIDE 0.02 % IN SOLN
RESPIRATORY_TRACT | Status: AC
  Administered 2014-09-08: 0.5 mg
  Filled 2014-09-08: qty 2.5

## 2014-09-08 MED ORDER — METRONIDAZOLE 500 MG PO TABS
500.0000 mg | ORAL_TABLET | Freq: Three times a day (TID) | ORAL | Status: DC
  Administered 2014-09-09 – 2014-09-11 (×7): 500 mg via ORAL
  Filled 2014-09-08 (×10): qty 1

## 2014-09-08 MED ORDER — ALPRAZOLAM 0.5 MG PO TABS: 0.5000 mg | ORAL_TABLET | ORAL | Status: DC

## 2014-09-08 MED ORDER — LEVOTHYROXINE SODIUM 25 MCG PO TABS: 25.0000 ug | ORAL_TABLET | Freq: Every day | ORAL | Status: DC

## 2014-09-08 MED ORDER — LIDOCAINE-PRILOCAINE 2.5-2.5 % EX CREA
1.0000 "application " | TOPICAL_CREAM | CUTANEOUS | Status: DC | PRN
  Filled 2014-09-08: qty 5

## 2014-09-08 MED ORDER — DEXTROSE 5 % IV SOLN: 1.5000 g | INTRAVENOUS | Status: DC

## 2014-09-08 MED ORDER — AMIODARONE HCL 400 MG PO TABS: 400.0000 mg | ORAL_TABLET | Freq: Two times a day (BID) | ORAL | Status: DC

## 2014-09-08 MED ORDER — METOPROLOL TARTRATE 25 MG PO TABS: 12.5000 mg | ORAL_TABLET | Freq: Two times a day (BID) | ORAL | Status: DC

## 2014-09-08 NOTE — Progress Notes (Signed)
69 Days Post-Op  Subjective: No new complaints  Objective: Vital signs in last 24 hours: Temp:  [96.4 F (35.8 C)-98.4 F (36.9 C)] 98.4 F (36.9 C) (06/07 0446) Pulse Rate:  [99-120] 99 (06/07 0446) Resp:  [18-37] 18 (06/07 0446) BP: (90-123)/(65-83) 90/65 mmHg (06/07 0446) SpO2:  [93 %-100 %] 96 % (06/07 0446) Last BM Date: 09/07/14  Intake/Output from previous day: 06/06 0701 - 06/07 0700 In: 2184.8 [P.O.:240; I.V.:570; NG/GT:1034.8; IV Piggyback:300] Out: -  Intake/Output this shift:    Abdomen soft, minimally tender with drains in place  Lab Results:  No results for input(s): WBC, HGB, HCT, PLT in the last 72 hours. BMET No results for input(s): NA, K, CL, CO2, GLUCOSE, BUN, CREATININE, CALCIUM in the last 72 hours. PT/INR No results for input(s): LABPROT, INR in the last 72 hours. ABG No results for input(s): PHART, HCO3 in the last 72 hours.  Invalid input(s): PCO2, PO2  Studies/Results: Dg Sinus/fist Tube Chk-non Gi  09/07/2014   CLINICAL DATA:  Evaluate for persistence of fistula.  EXAM: ABSCESS INJECTION  TECHNIQUE: Under direct fluoroscopic guidance water-soluble contrast material was injected through the right-sided percutaneous drainage catheter.  CONTRAST:  12mL OMNIPAQUE IOHEXOL 300 MG/ML  SOLN  FLUOROSCOPY TIME:  Radiation Exposure Index (as provided by the fluoroscopic device):  If the device does not provide the exposure index:  Fluoroscopy Time (in minutes and seconds):  48 seconds  Number of Acquired Images:  6  COMPARISON:  07/27/2014  FINDINGS: Since the previous exam the right-sided percutaneous drainage catheter appears to have been pulled back. Injection of water-soluble contrast material through the catheter was performed. No extravasation of contrast material into the peritoneal cavity identified. The contrast material preferentially flowed in a retrograde fashion around the catheter to the skin surface. A thin, short sinus tract was noted extending  from the tip of the catheter. No opacification of duodenum noted.  IMPRESSION: 1. No evidence for free extravasation of contrast material in the intraperitoneal cavity in no evidence for communication with the duodenum. 2. The ingested contrast material preferentially flowed in a retrograde fashion towards the skin surface.   Electronically Signed   By: Signa Kellaylor  Stroud M.D.   On: 09/07/2014 15:05    Anti-infectives: Anti-infectives    Start     Dose/Rate Route Frequency Ordered Stop   08/27/14 1900  cefTAZidime (FORTAZ) 2 g in dextrose 5 % 50 mL IVPB  Status:  Discontinued     2 g 100 mL/hr over 30 Minutes Intravenous Every T-Th-Sa (Hemodialysis) 08/27/14 1846 09/08/14 0713   08/27/14 1900  metroNIDAZOLE (FLAGYL) IVPB 500 mg     500 mg 100 mL/hr over 60 Minutes Intravenous Every 8 hours 08/27/14 1847     08/21/14 1430  cefTAZidime (FORTAZ) 1 g in dextrose 5 % 50 mL IVPB     1 g 100 mL/hr over 30 Minutes Intravenous  Once 08/21/14 1426 08/21/14 1547   08/18/14 1300  metroNIDAZOLE (FLAGYL) IVPB 500 mg  Status:  Discontinued     500 mg 100 mL/hr over 60 Minutes Intravenous 3 times per day 08/18/14 1045 08/27/14 1847   08/18/14 1200  cefTAZidime (FORTAZ) 2 g in dextrose 5 % 50 mL IVPB  Status:  Discontinued     2 g 100 mL/hr over 30 Minutes Intravenous Every T-Th-Sa (Hemodialysis) 08/18/14 1054 08/27/14 1846   08/12/14 1449  ceFAZolin (ANCEF) 2-3 GM-% IVPB SOLR    Comments:  Troy SineLaffan, Eileen   : cabinet override  08/12/14 1449 08/12/14 1629   08/12/14 1130  ceFAZolin (ANCEF) IVPB 2 g/50 mL premix    Comments:  Hang ON CALL to xray 5/11   2 g 100 mL/hr over 30 Minutes Intravenous To Radiology 08/12/14 1035 08/12/14 1520   08/04/14 1200  vancomycin (VANCOCIN) 50 mg/mL oral solution 125 mg  Status:  Discontinued     125 mg Oral 4 times per day 08/04/14 0851 08/08/14 1229   07/18/14 1800  vancomycin (VANCOCIN) 50 mg/mL oral solution 125 mg  Status:  Discontinued     125 mg Oral 4 times per  day 07/18/14 1455 08/04/14 0845   07/17/14 1830  vancomycin (VANCOCIN) IVPB 750 mg/150 ml premix     750 mg 150 mL/hr over 60 Minutes Intravenous  Once 07/17/14 1818 07/18/14 0024   07/15/14 1400  metroNIDAZOLE (FLAGYL) tablet 500 mg  Status:  Discontinued     500 mg Oral 3 times per day 07/15/14 1202 07/18/14 1533   07/13/14 2200  piperacillin-tazobactam (ZOSYN) IVPB 2.25 g  Status:  Discontinued     2.25 g 100 mL/hr over 30 Minutes Intravenous 3 times per day 07/13/14 1318 07/24/14 1138   07/13/14 2000  fluconazole (DIFLUCAN) IVPB 200 mg  Status:  Discontinued     200 mg 100 mL/hr over 60 Minutes Intravenous Every 24 hours 07/13/14 1318 07/19/14 1027   07/07/14 1200  vancomycin (VANCOCIN) IVPB 1000 mg/200 mL premix  Status:  Discontinued     1,000 mg 200 mL/hr over 60 Minutes Intravenous Every 24 hours 07/07/14 0937 07/14/14 1712   07/06/14 2000  fluconazole (DIFLUCAN) IVPB 400 mg  Status:  Discontinued     400 mg 100 mL/hr over 120 Minutes Intravenous Every 24 hours 07/06/14 1507 07/13/14 1318   07/06/14 1800  piperacillin-tazobactam (ZOSYN) IVPB 2.25 g  Status:  Discontinued     2.25 g 100 mL/hr over 30 Minutes Intravenous 4 times per day 07/06/14 1505 07/13/14 1318   07/05/14 2000  fluconazole (DIFLUCAN) IVPB 200 mg  Status:  Discontinued     200 mg 100 mL/hr over 60 Minutes Intravenous Every 24 hours 07/05/14 0757 07/06/14 1507   07/05/14 1400  piperacillin-tazobactam (ZOSYN) IVPB 2.25 g  Status:  Discontinued     2.25 g 100 mL/hr over 30 Minutes Intravenous 3 times per day 07/05/14 0749 07/06/14 1505   07/02/14 1800  vancomycin (VANCOCIN) IVPB 1000 mg/200 mL premix  Status:  Discontinued     1,000 mg 200 mL/hr over 60 Minutes Intravenous Every 24 hours 07/01/14 1858 07/05/14 0801   07/01/14 2200  piperacillin-tazobactam (ZOSYN) IVPB 3.375 g  Status:  Discontinued     3.375 g 100 mL/hr over 30 Minutes Intravenous 4 times per day 07/01/14 1858 07/05/14 0749   07/01/14 2000   fluconazole (DIFLUCAN) IVPB 400 mg  Status:  Discontinued     400 mg 100 mL/hr over 120 Minutes Intravenous Every 24 hours 07/01/14 1858 07/05/14 0757   07/01/14 1400  fluconazole (DIFLUCAN) IVPB 200 mg  Status:  Discontinued     200 mg 100 mL/hr over 60 Minutes Intravenous Every 24 hours 07/01/14 1330 07/01/14 1853   07/01/14 1000  piperacillin-tazobactam (ZOSYN) IVPB 2.25 g  Status:  Discontinued     2.25 g 100 mL/hr over 30 Minutes Intravenous Every 8 hours 07/01/14 0952 07/01/14 1853   07/01/14 1000  vancomycin (VANCOCIN) 500 mg in sodium chloride 0.9 % 100 mL IVPB     500 mg 100 mL/hr over 60 Minutes  Intravenous  Once 07/01/14 0952 07/01/14 1126   06/30/14 1200  vancomycin (VANCOCIN) IVPB 1000 mg/200 mL premix     1,000 mg 200 mL/hr over 60 Minutes Intravenous  Once 06/30/14 0944 06/30/14 1403   06/30/14 1200  ceFEPIme (MAXIPIME) 2 g in dextrose 5 % 50 mL IVPB     2 g 100 mL/hr over 30 Minutes Intravenous  Once 06/30/14 0944 06/30/14 1425   06/30/14 0100  vancomycin (VANCOCIN) 2,000 mg in sodium chloride 0.9 % 500 mL IVPB     2,000 mg 250 mL/hr over 120 Minutes Intravenous  Once 06/30/14 0048 06/30/14 0525   06/30/14 0100  ceFEPIme (MAXIPIME) 2 g in dextrose 5 % 50 mL IVPB     2 g 100 mL/hr over 30 Minutes Intravenous  Once 06/30/14 0048 06/30/14 0322   06/27/14 0600  cefUROXime (ZINACEF) 1.5 g in dextrose 5 % 50 mL IVPB     1.5 g 100 mL/hr over 30 Minutes Intravenous On call to O.R. 06/26/14 1019 06/27/14 0630   06/19/14 1800  oseltamivir (TAMIFLU) capsule 30 mg     30 mg Oral Every M-W-F (1800) 06/19/14 1003 06/22/14 1841   06/18/14 1600  oseltamivir (TAMIFLU) capsule 30 mg  Status:  Discontinued     30 mg Oral Daily 06/18/14 1538 06/19/14 1003      Assessment/Plan: s/p Procedure(s): EXPLORATORY LAPAROTOMY WITH CLOSURE OF PERFORATED PYLORIC ULCER (N/A) SMALL BOWEL RESECTION (N/A)  For drain study today to see if fistula has closed Continue current care  LOS: 83 days     Pennie Vanblarcom A 09/08/2014

## 2014-09-08 NOTE — Consult Note (Signed)
Revision AVF left arm tomorrow Consent NPO p midnight Antibiotics on call  Janoah Menna, MD [facility] Office: [phone] Pager: [phone]  

## 2014-09-08 NOTE — Progress Notes (Signed)
Pt completed HD with no complications. Alert, vss, 2.8L removed. Report given and patient returned safely to room. CCMD notified.

## 2014-09-08 NOTE — Discharge Summary (Addendum)
Physician Discharge Summary  [name] ZOX:[id number] DOB: 28-Nov-1955 DOA: 06/17/2014  PCP: Willey BladeEAN, [name], MD  Admit date: 06/17/2014 Discharge date: 09/11/2014  Time spent:45 minutes  Recommendations for Outpatient Follow-up:  1. FU with [facility] needs LFTs, TSH FU and Likely Amiodarone dose decreased on FU 2. FU with [facility] , s/p ex lap for Abd abscesses, J tube feeds 3. Left Renal mass will need Urology FU 4. COmplete PO FLagyl on 6/13 to finish treatment for cdiff, continued diarrhea thought to be due to tube feeds, need recheck c diff periodically 5. 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   Duodenal anastomotic leak   Intra -abdominal abscesses   Enterocutaneous fistula   C diff colitis   New ESRD   Hypertension   ESRD needing dialysis   Obesity (BMI 30-39.9)   Depression   Left renal mass   Chronic diastolic heart failure   H1N1 influenza   Tobacco use disorder   Renal failure (ARF), acute on chronic   Hyperlipidemia   PSVT (paroxysmal supraventricular tachycardia)   HIT (heparin-induced thrombocytopenia)   Fistula   Abdominal pain   Palliative care encounter   ESRD (end stage renal disease)   SVT (supraventricular tachycardia)   Right heart failure   Duodenal anastomotic leak   Protein calorie malnutrition   Itching   Aortic aneurysm   Protein-calorie malnutrition   Duodenal fistula   Discharge Condition: guarded but improved  Diet recommendation: Renal  Filed Weights   09/09/14 0500 09/09/14 2039 09/10/14 0435  Weight: 96.7 kg (213 lb 3 oz) 98.975 kg (218 lb 3.2 oz) 98.975 kg (218 lb 3.2 oz)    History of present illness:  59 y/[facility] shortness of breath and acute hypoxemic respiratory  failure felt to be felt to be due to volume overload.  Hospital Course:  59 yo/F smoker with hx HTN, COPD, CKD IV followed @ [facility]-Dr. [name], failed L AV fistula, initially admitted 3/16 with flu and acute hypoxic resp failure. Ultimately tx to [facility] due to need for HD. L sided weakness 3/18 and found to have R ACA stroke-showed acute infarct R ACA (3/19). Patient more lethargic 3/30 and CT abd=perf viscus 3/30. She was taken to the OR for ex lap for perforated pyloric ulcer with diffuse peritonitis, ischemic necrosis of mid ileum. Patient developed septic shock on 4-1, she was started on pressors and wean off pressors on 4-3. Subsequently, she was diagnosed with pelvic abscess by CT scan perform on 4-7. She underwent pelvic drain placement by IR on 4/8= culture neg 08/15/14 another drained placed in the pelvic cul-de-sac abscess with culture = pseudomonas. On 08/20/14, Gluteal drain was inadvertently pulled out. Repeat CT scan shows improvement in the fluid collection so decision was to leave it out and continue IV abx. Pt hospital stay also complicated by high output duodenal (enterocutaneous fistula) fistula. As a result, pt was made npo and started on TPN. Required long term TNA +high output from fistula/leak per [facility]. Had a G_J tube placed by Dr. [name] of IR and Gen surgery has continued to assist with management of her multiple surgical comorbidities C. diff diagnosed 4-12-initially unresponsive to flagyl, and she was started on PO vancomycin 4-16 which she finished  3 week. 5/27--TPN weaned off and increasing TF. Hospital course complicated by SVT, atrial tachycardia. 4/27 CT angio negative for PE. Cardiology was following, now on amiodarone, lopressor Temporarily reuired BIPAP 5/2 for hypotension, hypoxia and SVT in HD, transferred to SDU.Marland Kitchen She was taken off BIPAP. This recurred on 5/15 with episodic SVT. Since then she has had intermittent short episodes of SVT, but on 08/28/14 she has  had more sustained SVT, Improved since then on higher dose Amio. -Subsequently started diet and tolerating this well, Management per [facility];  Detailed course: Status post exploratory laparotomy with closure of perforated pyloric ulcer/abdominal abscess / small bowel resection w/ postop ileus and now with enterocutaneous fistula. Pelvic abscess s/p percutaneous drain placement  -completed, long courses of IV Abx including Vanc and FLuconazole -Due to Fistula, she was made NPO, TPN was started -Fistula study done shows JP drain connection to the duodenal bulb-5-02. - 08/15/2014 repeat CT abdomen with 3.0 x 3.5 x 5.7 cm collection in the pelvic cul-de-sac and extraluminal air alongside JP drain in anterior abd wall -had high output from Fistula/leak. Had G_J placed by IR 08/12/14 at request of gen surgery -CT repeat shows 5 cm post cul-de-sac abscess which was drained on 5/16-culture revealed pseudomonas aeruginosa -ID was consulted as patient had Pseudomonas growing from her culture wounds and they started back Flagyl as well as Elita Quick on 5/16  -Per ID recommendation--just completed 21/21 days of Fortaz on 6/6 and now on D#22/ 28 days metronidazole -Patient inadvertently pulled out her gluteal/cul-de-sac drain May 19. Seen by interventional radiology and they recommended repeat CT scan, which was done. Shows that the fluid collection has decreased in size. They do not plan to replace the drain at this time. Continue with antibiotics alone.  -08/21/2014 CT scan did suggest a transmural defect in the stomach wall near the patient's pyloric Cheree Ditto patch. -Patient accidentally pulled out her JP drain on 5/22. This has been replaced by surgery on 5/23. -Octreotide was discontinued 5/23--enterocutaneous fistula output appears to be decreasing gradually -finished TPN on 08/27/14 -Enteral Feeding was increased 5/27 to 55 mL per hour--tolerating without residuals -08/29/14--started clear  liquids-->tolerating well -08/31/14--pt inadvertently pulled out R-side JP drain again (3rd time) during therapy + GJ tube with bloody drainage--contacted general surgery 09/01/14--IR replaced RUQ drain (entercutaneous fistula)--reminded pt to wear abd binder at all times; no output from this drain 6/1: started full liquids  6/3: started regular diet: ATE a Hamburger!!-Huge milestone as far as patient is concerned, consider stop J tube feeding if able to take oral consistently, to be determined by [facility] Fistula study 6/6 unremarkable Perc drain removed on 6/10 after repeat ct ab. DC to CIR , cleared for discharge by [facility] on 6/10   PSVT --Atrial tachycardia -Felt to be secondary to fluid metabolic shifts from ESRD and ongoing abd issues; also has severe anxiety with dialysis as well as COPD/Cor Pulmonale  -was on IV metoprolol and amiodarone, now 5/31 switched to PO  -CTA 4/27, negative for PE  -Cards following peripherally, HR fluctuates in HD, cards to determine dosing adjustment of amiodarone.  ESRD new, now on HD this admit complicated by intermittent hypotension AKI on CKD 4, due to sepsis, shock Ongoing hemodialysis per Nephrology - perm-cath placed 3/26 d/t AVF problems Patient AV fistula reviewed and not mature yet per Dr. Darrick Penna -Continue hemodialysis Tuesday, Thursday, Saturday while the patient is in the hospital -Xanax with each HD due to significant anxiety with HD -starting to sit up with HD  now, AVF revision by [facility] 6/8  C. difficile colitis still on contact precautions -C diff positive on 4-12, Received 4 days of flagyl without improvement, then started on PO vanc -Completed 3 weeks of Oral Vanc-stopped 5/7, this was 1 week after IV Zosyn was stopped 5/5 -5/28--having loose stools again-->Cdiff PCR negative -keep on contact precautions, now on Po flagyl till 6/13, to stop 1 week after [place] for pseudomonas completed 6/6  ACA CVA with Lt sided weakness -06/23/14--TEE  ;normal LV function; no LAA thrombus -Started on ASA 81mg  per Neuro; resumed this -Will need ongoing long term rehab - PT/OT to cont to follow while inpatient -Patient remains very weak and deconditioned. 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 Fluids/volume managed with hemodialysis.  Elevated PA pressures on 2-D echo; CTA negative for PE - 07/23/2014 echo-- EF 60-61%, grade 1 diastolic dysfunction, Severe RV dilatation, PAP 90  Small PFO Has been evaluated by Cardiology. No intervention planned currently.  Anemia of critical illness/chronic disease -and bleeding from leak/fistula which has resolved. -s/p 2U PRBC 4/13 and 5/10 and 5/26 -Aranesp/iron per renal -transfuse for hemoglobin less than 7, stable  Acute hypoxic Respiratory Failure:  - H1N1 tested positive initially, hypervolemia, HCAP >> resolved.  -also has COPD and Cor Pulmonale - schedule Xopenex and Atrovent,  started singulair, cautious about steroids due to h/o perforated gastric ulcer, s/p solumedrol x1 on 6/10, prednisone 50mg  po x3 more days, then stop. Started pepcid  For gastric protection, avoid ppi due to history of c diff. - presently stable on 2 L-3L  Thrombocytopenia; resolved.  Appears to be associated with sepsis Has had mild positive HIT test during this admission   Septic shock  -due to perforated prepyloric ulcer, required pressors -resolved currently  Diabetes mellitus 2 Did have episodes of Hypoglycemia when she was NPO status  -Hba1c 6.5 -monitor blood sugar while on steroids for wheezing  Abnormal TSH -Free T4 WNL, T3 1.8 -most likely sick thyroid with ongoing infection and body stress; also due to amiodarone use. TSH in 7 range -continue low dose synthroid given PSVT, changed to PO synthroid  L Renal mass  -noted on [place], Indeterminate 1.9 cm hypoechoic mass off the interpolar region of the  left kidney -may potentially represent a cyst however solid mass is not excluded -needs outpatient FU for this  Left lower extremity edema: negative for DVT by ultrasound on 5/3 and 6/3.  Procedures:  06/23/14--TEE ;normal LV function; no LAA thrombus  3/26: Dr.Lawson [facility]: INSERTION OF Right Internal Jugular DIATEK CATHETER-19 cm  3/30: Dr.Ingram: Exploratory laparotomy, evacuation of ascites, closure of perforated pyloric ulcer with omental patch, segmental resection of 14 inches of ileum with primary anastomosis  4/8: by IR Dr.Schick: CT GUIDED pelvic abscess drain insertion  4/27: Successful LT IJ TUNNELED DL POWER PICC Dr.Schick, IR  5/11: Dr.Hassell IR : Gastrostomy tube: 28f [place]ax DL G_J placed  0/98: Dr.Henn IR: CT guided drain placement in pelvic abscess collection  5/31: Dr.Henn:  IR catheter tube change: duodenal fistula: for Surgical drain in RUQ that fell out  6/8: planned AVF revision by [facility]   Consultations:  Renal   IR  ID  [facility]  PCCM  Cardiology  Neurology  Discharge Exam: Filed Vitals:   09/11/14 1042  BP:   Pulse: 120  Temp:   Resp:     General: Pt is alert, follows commands appropriately, not in acute distress,  chronically ill appearing,   HEENT: No icterus, [place]/AT  Cardiovascular: RRR, S1/S2, no rubs, no gallops  Respiratory: basilar ronchi, intermittent wheezing, Good air movement.  Abdomen: Soft/+BS, non tender, non distended, no guarding, G tube clamped, j tube with TF, perc drain no output observed.   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, aaox3   Discharge Instructions   Discharge Instructions    Diet - low sodium heart healthy    Complete by:  As directed      Increase activity slowly    Complete by:  As directed           Current Discharge Medication List    START taking these medications   Details   ALPRAZolam (XANAX) 0.5 MG tablet Take 1 tablet (0.5 mg total) by mouth Every Tuesday,Thursday,and Saturday with dialysis. Refills: 0    Amino Acids-Protein Hydrolys (FEEDING SUPPLEMENT, PRO-STAT SUGAR FREE 64,) LIQD Place 30 mLs into feeding tube 3 (three) times daily. Qty: 900 mL, Refills: 0    amiodarone (PACERONE) 400 MG tablet Take 1 tablet (400 mg total) by mouth 2 (two) times daily.    aspirin 81 MG chewable tablet Chew 1 tablet (81 mg total) by mouth daily.    budesonide (PULMICORT) 0.25 MG/2ML nebulizer solution Take 2 mLs (0.25 mg total) by nebulization 2 (two) times daily. Qty: 60 mL, Refills: 12    calcitRIOL (ROCALTROL) 0.5 MCG capsule Take 1 capsule (0.5 mcg total) by mouth every Tuesday, Thursday, and Saturday at 6 PM. Qty: 30 capsule, Refills: 3    famotidine (PEPCID) 20 MG tablet Take 1 tablet (20 mg total) by mouth daily. Qty: 30 tablet, Refills: 0    ferric gluconate 125 mg in sodium chloride 0.9 % 100 mL Inject 125 mg into the vein Every Tuesday,Thursday,and Saturday with dialysis. Qty: 30 Can, Refills: 3    ipratropium (ATROVENT) 0.02 % nebulizer solution Take 2.5 mLs (0.5 mg total) by nebulization 3 (three) times daily. Qty: 75 mL, Refills: 12    levalbuterol (XOPENEX) 0.63 MG/3ML nebulizer solution Take 3 mLs (0.63 mg total) by nebulization 3 (three) times daily. Qty: 3 mL, Refills: 12    levothyroxine (SYNTHROID, LEVOTHROID) 25 MCG tablet Take 1 tablet (25 mcg total) by mouth daily before breakfast.    metoprolol tartrate (LOPRESSOR) 25 MG tablet Take 0.5 tablets (12.5 mg total) by mouth 2 (two) times daily.    metroNIDAZOLE (FLAGYL) 500 MG tablet Take 1 tablet (500 mg total) by mouth every 8 (eight) hours. Qty: 9 tablet, Refills: 0    montelukast (SINGULAIR) 10 MG tablet Take 1 tablet (10 mg total) by mouth at bedtime. Qty: 30 tablet, Refills: 3    predniSONE (DELTASONE) 50 MG tablet Take 1 tablet (50 mg total) by mouth daily with breakfast. Qty: 3  tablet, Refills: 0      CONTINUE these medications which have CHANGED   Details  allopurinol (ZYLOPRIM) 100 MG tablet Take 1 tablet (100 mg total) by mouth daily.      CONTINUE these medications which have NOT CHANGED   Details  citalopram (CELEXA) 20 MG tablet Take 20 mg by mouth daily.    oxyCODONE (ROXICODONE) 5 MG immediate release tablet Take 1 tablet (5 mg total) by mouth every 6 (six) hours as needed for severe pain. Qty: 15 tablet, Refills: 0      STOP taking these medications     furosemide (  LASIX) 80 MG tablet      hydrOXYzine (ATARAX/VISTARIL) 25 MG tablet        Allergies  Allergen Reactions  . 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   Follow-up Information    Follow up with SETHI,PRAMOD, MD In 2 months.   Specialties:  Neurology, Radiology   Why:  stroke clinic, office will call you for follow up appointment   Contact information:   [address] [place] [address] [phone]       Follow up with [facility] [place].   Why:  As needed   Contact information:   [address] [place] [address]        The results of significant diagnostics from this hospitalization (including imaging, microbiology, ancillary and laboratory) are listed below for reference.    Significant Diagnostic Studies: Ct Abdomen Pelvis Wo Contrast  09/10/2014   CLINICAL DATA:  Follow-up small bowel fistula  EXAM: CT ABDOMEN AND PELVIS WITHOUT CONTRAST  TECHNIQUE: Multidetector CT imaging of the abdomen and pelvis was performed following the standard protocol without IV contrast.  COMPARISON:  CT 08/21/2014, 08/03/2014  FINDINGS: Lower chest: Mild reticular nodular pattern in the left lung base is increased. Findings could represent resolving pneumonia or aspiration pneumonitis.  Hepatobiliary: There is no intrahepatic biliary duct dilatation. No pneumobilia. Percutaneous drainage catheter extending to the capsule of the right hepatic lobe at the chest wall. This is  been retracted from the region of the porta hepatis on comparison CT.  High-density fluid in the gallbladder.  Pancreas: Pancreas is normal. No ductal dilatation. No pancreatic inflammation.  Spleen: Normal spleen  Adrenals/urinary tract: Adrenal glands and kidneys are normal. The ureters and bladder normal.  Stomach/Bowel: The percutaneous gastrostomy tube in the stomach. There is a catheter extending from the gastric body through the first portion the duodenum which presumably is a J arm extension of the gastrostomy tube. This tube terminates at the gastric antrum.  Gas in the porta hepatis measuring 19 mm at site of prior extraluminal gas collection. There is no evidence indication with the small bowel. No evidence of bowel obstruction. Contrast flows the entirety of the bowel to the colon. No extravasation of contrast  Vascular/Lymphatic: Abdominal aorta is normal caliber. There is no retroperitoneal or periportal lymphadenopathy. No pelvic lymphadenopathy.  Reproductive: Leiomyomas uterus.  Ovaries are normal.  Musculoskeletal: No aggressive osseous lesion.  Other: No abscess in pelvis.  IMPRESSION: 1. Percutaneous drainage catheters has been withdrawn to the right abdominal wall. 2. Small extraluminal gas collection remains the porta hepatis. No clear communication small bowel. 3. J arm extension has been withdrawn withdrawn into the duodenum bulb / gastric antrum. 4. No evidence of bowel obstruction. 5. No evidence of leakage of oral contrast. 6. No evidence of pelvic abscess per   Electronically Signed   By: Genevive Bi M.D.   On: 09/10/2014 17:19   Ct Abdomen Pelvis Wo Contrast  08/22/2014   CLINICAL DATA:  Abdominal abscess. No intravenous contrast due to renal failure.  EXAM: CT ABDOMEN AND PELVIS WITHOUT CONTRAST  TECHNIQUE: Multidetector CT imaging of the abdomen and pelvis was performed following the standard protocol without IV contrast.  COMPARISON:  08/15/2014  FINDINGS: BODY WALL: Open  laparotomy without fluid collection.  LOWER CHEST: Chronic triangular opacity in the posterior costophrenic sulcus on the left, atelectasis versus scarring  ABDOMEN/PELVIS:  Liver: No focal abnormality.  Biliary: High-density material within the gallbladder showing mild wall thickening which is chronic.  Pancreas: Unremarkable.  Spleen: Unremarkable.  Adrenals: Unremarkable.  Kidneys and ureters: No hydronephrosis or stone. Presumed 1 cm cyst from the interpolar left kidney  Bladder: Decompressed.  No pathologic findings.  Reproductive: Calcified and noncalcified uterine fibroids, better seen on previous enhanced imaging.  Bowel: Right upper quadrant drain related to perforated pyloric ulcer status post Cheree Ditto patch. Specially visible on coronal imaging, there is a gas channel through the pylorus measuring 6 to 9 mm in diameter. No bowel obstruction. Enteroenterostomy noted in the pelvis. Oral contrast again seen in the colon. No notable colonic wall thickening in this patient with history of C difficile. Negative appendix. Percutaneous gastrojejunostomy tube is in good position.  Retroperitoneum: No mass or adenopathy.  Peritoneum: Known thick walled collection in the rectovaginal recess smaller than 08/15/2014 at 40 x 16 mm as compared to 51 x 32 mm. No new collection is identified. No free pneumoperitoneum.  Vascular: 3 cm fusiform infrarenal aortic aneurysm.  OSSEOUS: No acute abnormalities.  IMPRESSION: 1. Near the patient's pyloric Cheree Ditto patch is a transmural defect in the stomach wall, contiguous with the surgical drain. If repeat surgery is considered, oral contrast could confirm an open ulcer. 2. Decreased rectovaginal recess fluid collection, now 4 x 1.5 cm (previously 5 x 3 cm).   Electronically Signed   By: Marnee Spring M.D.   On: 08/22/2014 01:49   Ct Abdomen Pelvis Wo Contrast  08/15/2014   CLINICAL DATA:  Intra abdominal abscess.  EXAM: CT ABDOMEN AND PELVIS WITHOUT CONTRAST  TECHNIQUE:  Multidetector CT imaging of the abdomen and pelvis was performed following the standard protocol without IV contrast.  COMPARISON:  CT scan of Aug 03, 2014.  FINDINGS: Severe degenerative disc disease is noted at L5-S1. Minimal subsegmental atelectasis is noted posteriorly in the left lung base.  No gallstones are noted. No focal abnormality is noted in the liver, spleen or pancreas on these unenhanced images. Adrenal glands appear normal. Stable exophytic cyst is seen arising from midpole of left kidney. No hydronephrosis or renal obstruction is noted. No renal or ureteral calculi are noted. 3 cm infrarenal abdominal aortic aneurysm is noted. Gastrojejunostomy tube is seen entering stomach percutaneously and with distal tip in the jejunum. JP surgical drain is seen entering right upper quadrant of abdomen underneath the liver with distal tip in the left upper quadrant. No significant fluid collection is noted around the drain. Fluid collection is again identified and posterior cul-de-sac of pelvis measuring 5.1 x 3.2 cm. Calcified degenerating fibroid is noted in the uterus. Urinary bladder is unremarkable. There is no evidence of bowel obstruction. The appendix appears normal. No significant adenopathy is noted. Large midline surgical anterior abdominal wall incision is noted.  IMPRESSION: Stable 3 cm infrarenal abdominal aortic aneurysm.  No change in position of JP surgical drain with distal tip in the left upper quadrant of the abdomen.  Interval placement of percutaneous gastrojejunostomy tube with distal tip in the jejunum.  Grossly stable size and appearance of 5 cm fluid collection seen in posterior cul-de-sac of the pelvis.  Continued presence uterine fibroids.   Electronically Signed   By: Lupita Raider, M.D.   On: 08/15/2014 15:15   Dg Chest 1 View  08/13/2014   CLINICAL DATA:  Shortness of breath  EXAM: CHEST  1 VIEW  COMPARISON:  08/03/2014  FINDINGS: Moderately severe cardiac enlargement. Central  line in unchanged position on the right and left side. Mild vascular congestion with no evidence of edema effusion or  consolidation.  IMPRESSION: No acute findings   Electronically Signed   By: Esperanza Heir M.D.   On: 08/13/2014 11:04   Dg Chest 2 View  09/01/2014   CLINICAL DATA:  Followup respiratory failure  EXAM: CHEST  2 VIEW  COMPARISON:  08/23/2014  FINDINGS: Moderate cardiac enlargement. Left IJ catheter is identified with tip in the SVC. There is a right-sided dialysis catheter with tips in the SVC. No pleural effusion or edema. No airspace consolidation.  IMPRESSION: 1. Lungs remain clear. 2. Catheters positioned as above.   Electronically Signed   By: Signa Kell M.D.   On: 09/01/2014 09:04   Ir Gastrostomy Tube Mod Sed  08/12/2014   CLINICAL DATA:  Perforated duodenal ulcer, post g patch with persistent leak. Percutaneous gastrojejunostomy as requested for gastric decompression and small bowel enteral feeding support.  EXAM: PERC PLACEMENT GASTROSTOMY;  CONVERT G-TUBE TO G-JTUBE  FLUOROSCOPY TIME:  16 minutes 24 seconds, 1016.6 mGy  TECHNIQUE: The procedure, risks, benefits, and alternatives were explained to the patient. Questions regarding the procedure were encouraged and answered. The patient understands and consents to the procedure. As antibiotic prophylaxis, cefazolin 2 g was ordered pre-procedure and administered intravenously within one hour of incision. . A 5 French angiographic catheter was placed as orogastric tube. The upper abdomen was prepped with Betadine, draped in usual sterile fashion, and infiltrated locally with 1% lidocaine. 1 mg glucagon was administered intravenously to facilitate gastric distention and slow peristalsis. Stomach was insufflated using air through the orogastric tube. An 58 French sheath needle was advanced percutaneously into the gastric lumen under fluoroscopy. Gas could be aspirated and a small contrast injection confirmed intraluminal spread. The  sheath was exchanged over a guidewire for a 9 [place] vascular sheath, through which the snare device was advanced and used to snare a guidewire passed through the orogastric tube. This was withdrawn, and the snare attached to the 20 French pull-through gastrostomy tube, which was advanced antegrade, positioned with the internal bumper securing the anterior gastric wall to the anterior abdominal wall. Small contrast injection confirms appropriate positioning. The external bumper was applied and the catheter was flushed.  A 5 French angiographic catheter was placed coaxially through the gastrostomy tube and used to direct an angled stiff glidewire across the pylorus, beyond the proximal duodenal perforation, and into the proximal jejunum just beyond the ligament of Treitz. Over this, a coaxial jejunostomy feeding adapter was advanced. Contrast injection confirmed patency and appropriate position of the gastric and jejunal lumens. No extravasation. The lumens were flushed with saline and capped. The patient tolerated the procedure well.  COMPLICATIONS: COMPLICATIONS none  IMPRESSION: 1. Technically successful 20 French pull-through gastrostomy placement under fluoroscopy. 2. Technically successful coaxial jejunostomy feeding adapter placement to the ligament of Treitz.   Electronically Signed   By: Corlis Leak M.D.   On: 08/12/2014 16:12   Ir Catheter Tube Change  09/01/2014   CLINICAL DATA:  History of a duodenal fistula. Surgical drain recently fell out and request for drain replacement.  EXAM: REPLACEMENT OF PERCUTANEOUS DRAIN WITH FLUOROSCOPY  Physician: Rachelle Hora. Henn, MD  FLUOROSCOPY TIME:  3 minutes, 246 mGy  MEDICATIONS AND MEDICAL HISTORY: None  ANESTHESIA/SEDATION: Moderate sedation time: None  CONTRAST:  15 mL Omnipaque-300  PROCEDURE: The procedure was explained to the patient. The risks and benefits of the procedure were discussed and the patient's questions were addressed. Informed consent was obtained  from the patient. The right upper abdomen was prepped and draped  in sterile fashion. Maximal barrier sterile technique was utilized including caps, mask, sterile gowns, sterile gloves, sterile drape, hand hygiene and skin antiseptic. A 5 French catheter was advanced through the old drain site. Contrast was injected as the catheter was advanced into the tissue. Contrast injection demonstrated a connection to the duodenum bulb and stomach. Catheter and wire preferentially went into the stomach via the duodenal bulb. A series of wires were used to advance a 12 French [employer] catheter into the abdomen. The tip of the catheter was cut in order to advance the catheter over a wire. The tip was placed adjacent to the duodenal fistula but not within the bowel. Catheter was sutured to the skin. Catheter was attached to gravity bag.  FINDINGS: Focal fistula connection between the drain tract and the duodenal bulb. New 12 French catheter was placed within the fistula tract but outside of the bowel.  Estimated blood loss: Minimal  COMPLICATIONS: None  IMPRESSION: Successful replacement of the percutaneous drain. Demonstration of the duodenal bulb fistula. Drain was placed adjacent to the fistula opening but outside of the bowel.   Electronically Signed   By: Richarda Overlie M.D.   On: 09/01/2014 13:39   Ir Adele Schilder Tamsen Snider Convert Gastr-jej Per W/fl Mod Sed  08/12/2014   CLINICAL DATA:  Perforated duodenal ulcer, post g patch with persistent leak. Percutaneous gastrojejunostomy as requested for gastric decompression and small bowel enteral feeding support.  EXAM: PERC PLACEMENT GASTROSTOMY;  CONVERT G-TUBE TO G-JTUBE  FLUOROSCOPY TIME:  16 minutes 24 seconds, 1016.6 mGy  TECHNIQUE: The procedure, risks, benefits, and alternatives were explained to the patient. Questions regarding the procedure were encouraged and answered. The patient understands and consents to the procedure. As antibiotic prophylaxis, cefazolin 2 g was  ordered pre-procedure and administered intravenously within one hour of incision. . A 5 French angiographic catheter was placed as orogastric tube. The upper abdomen was prepped with Betadine, draped in usual sterile fashion, and infiltrated locally with 1% lidocaine. 1 mg glucagon was administered intravenously to facilitate gastric distention and slow peristalsis. Stomach was insufflated using air through the orogastric tube. An 51 French sheath needle was advanced percutaneously into the gastric lumen under fluoroscopy. Gas could be aspirated and a small contrast injection confirmed intraluminal spread. The sheath was exchanged over a guidewire for a 9 [place] vascular sheath, through which the snare device was advanced and used to snare a guidewire passed through the orogastric tube. This was withdrawn, and the snare attached to the 20 French pull-through gastrostomy tube, which was advanced antegrade, positioned with the internal bumper securing the anterior gastric wall to the anterior abdominal wall. Small contrast injection confirms appropriate positioning. The external bumper was applied and the catheter was flushed.  A 5 French angiographic catheter was placed coaxially through the gastrostomy tube and used to direct an angled stiff glidewire across the pylorus, beyond the proximal duodenal perforation, and into the proximal jejunum just beyond the ligament of Treitz. Over this, a coaxial jejunostomy feeding adapter was advanced. Contrast injection confirmed patency and appropriate position of the gastric and jejunal lumens. No extravasation. The lumens were flushed with saline and capped. The patient tolerated the procedure well.  COMPLICATIONS: COMPLICATIONS none  IMPRESSION: 1. Technically successful 20 French pull-through gastrostomy placement under fluoroscopy. 2. Technically successful coaxial jejunostomy feeding adapter placement to the ligament of Treitz.   Electronically Signed   By: Corlis Leak  M.D.   On: 08/12/2014 16:12   Dg Sinus/fist Tube  Chk-non Gi  09/07/2014   CLINICAL DATA:  Evaluate for persistence of fistula.  EXAM: ABSCESS INJECTION  TECHNIQUE: Under direct fluoroscopic guidance water-soluble contrast material was injected through the right-sided percutaneous drainage catheter.  CONTRAST:  12mL OMNIPAQUE IOHEXOL 300 MG/ML  SOLN  FLUOROSCOPY TIME:  Radiation Exposure Index (as provided by the fluoroscopic device):  If the device does not provide the exposure index:  Fluoroscopy Time (in minutes and seconds):  48 seconds  Number of Acquired Images:  6  COMPARISON:  07/27/2014  FINDINGS: Since the previous exam the right-sided percutaneous drainage catheter appears to have been pulled back. Injection of water-soluble contrast material through the catheter was performed. No extravasation of contrast material into the peritoneal cavity identified. The contrast material preferentially flowed in a retrograde fashion around the catheter to the skin surface. A thin, short sinus tract was noted extending from the tip of the catheter. No opacification of duodenum noted.  IMPRESSION: 1. No evidence for free extravasation of contrast material in the intraperitoneal cavity in no evidence for communication with the duodenum. 2. The ingested contrast material preferentially flowed in a retrograde fashion towards the skin surface.   Electronically Signed   By: Signa Kell M.D.   On: 09/07/2014 15:05   Dg Chest Port 1 View  08/23/2014   CLINICAL DATA:  Left chest pain  EXAM: PORTABLE CHEST - 1 VIEW  COMPARISON:  08/18/2014  FINDINGS: Patient is rotated.  Lungs are essentially clear. No focal consolidation. No pleural effusion or pneumothorax.  Cardiomegaly.  Left IJ venous catheter terminates at the cavoatrial junction. Right IJ dual lumen dialysis catheter terminates in the lower SVC.  IMPRESSION: No evidence of acute cardiopulmonary disease.   Electronically Signed   By: Charline Bills M.D.   On:  08/23/2014 19:17   Dg Chest Port 1 View  08/18/2014   CLINICAL DATA:  Shortness of breath at rest.  Having hemodialysis.  EXAM: PORTABLE CHEST - 1 VIEW  COMPARISON:  08/15/2014.  FINDINGS: The cardiac silhouette remains mildly enlarged. Stable right jugular double-lumen catheter. Clear lungs. The pulmonary vasculature remains prominent. No pleural fluid. Minimal right shoulder degenerative changes.  IMPRESSION: Stable cardiomegaly and pulmonary vascular congestion.   Electronically Signed   By: Beckie Salts M.D.   On: 08/18/2014 09:47   Dg Chest Port 1 View  08/15/2014   CLINICAL DATA:  Shortness of breath.  EXAM: PORTABLE CHEST - 1 VIEW  COMPARISON:  08/13/2014 and multiple prior chest radiographs  FINDINGS: Cardiomegaly and pulmonary vascular congestion again noted.  A right IJ central venous catheter is noted with tips overlying the upper and mid SVC.  A left central venous catheter is present with tip overlying the lower SVC.  There is no evidence of focal airspace disease, pulmonary edema, suspicious pulmonary nodule/mass, pleural effusion, or pneumothorax. No acute bony abnormalities are identified.  IMPRESSION: Cardiomegaly with pulmonary vascular congestion.   Electronically Signed   By: Harmon Pier M.D.   On: 08/15/2014 17:28   Dg Abd Portable 1v  08/23/2014   CLINICAL DATA:  JP drain, broken, initial encounter  EXAM: PORTABLE ABDOMEN - 1 VIEW  COMPARISON:  CT abdomen pelvis dated 08/21/2014  FINDINGS: A portion of the right flank is excluded from this single image.  The patient's right abdominal surgical drain is not visualized.  Gastrojejunostomy in satisfactory position.  IMPRESSION: The patient's right abdominal surgical drain is not visualized.  Gastrojejunostomy in satisfactory position.   Electronically Signed   By: Lurlean Horns  Rito Ehrlich M.D.   On: 08/23/2014 19:11   Ct Image Guided Fluid Drain By Catheter  08/17/2014   CLINICAL DATA:  Postoperative intra-abdominal abscess, subsequent  encounter. Small pelvic fluid collection.  EXAM: CT-GUIDED DRAIN PLACEMENT IN PELVIC FLUID COLLECTION  Physician: Rachelle Hora. Lowella Dandy, MD  FLUOROSCOPY TIME:  None  MEDICATIONS: 50 mcg fentanyl  ANESTHESIA/SEDATION: Patient was monitored by a radiology nurse during the procedure.  PROCEDURE: Informed consent was obtained for drain placement. Patient was placed on her left side. Images through the pelvis were obtained. The right buttock was prepped and draped in sterile fashion. 1% lidocaine was used for a local anesthetic. An 18 gauge needle was directed into the pelvic fluid collection with CT guidance from a transgluteal approach. Thick yellow purulent fluid was aspirated. A stiff Amplatz wire was placed. The tract was dilated to accommodate a 10.2 [place] multipurpose drain. It was technically difficult to advance the drain into the collection due to the small size of the collection and the patient's body habitus. Eventually, the drain was successfully placed within the collection. 5 mL of yellow purulent fluid was obtained. Catheter was sutured to the skin and attached to a suction bulb. Fluid was sent for culture.  FINDINGS: Small fluid collection just posterior to the uterus. A total of 5 mL of yellow purulent fluid was removed.  Estimated blood loss: Minimal  COMPLICATIONS: None  IMPRESSION: CT-guided placement of a drainage catheter in the small pelvic fluid collection.   Electronically Signed   By: Richarda Overlie M.D.   On: 08/17/2014 17:21    Microbiology: Recent Results (from the past 240 hour(s))  Surgical pcr screen     Status: None   Collection Time: 09/09/14  6:45 AM  Result Value Ref Range Status   MRSA, PCR NEGATIVE NEGATIVE Final   Staphylococcus aureus NEGATIVE NEGATIVE Final    Comment:        The Xpert SA Assay (FDA approved for NASAL specimens in patients over 80 years of age), is one component of a comprehensive surveillance program.  Test performance has been validated by [facility] for  patients greater than or equal to 22 year old. It is not intended to diagnose infection nor to guide or monitor treatment.   Clostridium Difficile by PCR (not at [facility])     Status: None   Collection Time: 09/11/14  6:42 AM  Result Value Ref Range Status   C difficile by pcr NEGATIVE NEGATIVE Final     Labs: Basic Metabolic Panel:  Recent Labs Lab 09/06/14 1230 09/08/14 0500 09/08/14 1100 09/09/14 0513 09/09/14 2120 09/10/14 0741  NA  --  130* 129* 133* 135 134*  K  --  4.1 4.2 3.7 3.6 4.0  CL  --  90* 89* 94* 96* 95*  CO2  --  23 23 26 25 23   GLUCOSE  --  82 78 105* 166* 108*  BUN  --  41* 42* 21* 29* 38*  CREATININE  --  5.29* 5.32* 3.58* 4.35* 4.67*  CALCIUM  --  8.1* 8.0* 8.0* 8.4* 8.0*  MG 1.8  --   --   --   --   --   PHOS  --   --  8.5*  --  7.7* 8.2*   Liver Function Tests:  Recent Labs Lab 09/08/14 1100 09/09/14 2120 09/10/14 0741  AST  --   --  16  ALT  --   --  9*  ALKPHOS  --   --  130*  BILITOT  --   --  0.8  PROT  --   --  6.8  ALBUMIN 2.1* 2.1* 2.1*   No results for input(s): LIPASE, AMYLASE in the last 168 hours. No results for input(s): AMMONIA in the last 168 hours. CBC:  Recent Labs Lab 09/05/14 0532 09/08/14 0500 09/09/14 0513 09/09/14 2120 09/10/14 0741  WBC 11.1* 9.7 9.2 8.8 9.8  HGB 9.2* 9.2* 9.3* 9.5* 8.8*  HCT 29.4* 28.7* 30.1* 30.3* 27.9*  MCV 92.7 90.8 92.9 93.2 92.7  PLT 253 368 376 395 378   Cardiac Enzymes: No results for input(s): CKTOTAL, CKMB, CKMBINDEX, TROPONINI in the last 168 hours. BNP: BNP (last 3 results)  Recent Labs  06/17/14 2043  BNP 1378.8*    ProBNP (last 3 results) No results for input(s): PROBNP in the last 8760 hours.  CBG: No results for input(s): GLUCAP in the last 168 hours.     Signed:  Gavyn Ybarra  [facility] 09/11/2014, 12:49 PM

## 2014-09-08 NOTE — Progress Notes (Signed)
Occupational Therapy Treatment Patient Details Name: [name] MRN: [id number] DOB: 06-18-1955 Today's Date: 09/08/2014    History of present illness [name] is a 59 y.o. female with a history of CKD and hypertension came to the [facility] ED on 3/16 complaining of shortness of breath and generalized weakness x 2 weeks. Admitted with acute hypoxemic respiratory failure. During hospital stay noted to have weakness of her left side. 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  She developed septic shock 4/1 with pressors weaned off 4/3.  Began CRRT and pressors restarted 4/4; Developed pelvic abcess 4/7 with pelvic drain placed 4/8.  weaned off pressors 4/9.  Runs of SVT 4/5 and 4/11. Prolonged hospital course with hypoxemia and hypotension. Right abcominal drain accidentally pulled 5/30 and replaced 5/31   OT comments  Patient making slow progress towards OT goals. Patient continues to be limited by decrease in oxygen support and decreased activity tolerance/endurance. Patient on 2 liters of 02 via [place] and sats remained greater than 90%, however pt with increasing SOB during bed mobility and OOB transfer. Continue plan of care for now and continue to recommend SNF for ST rehab prior to pt discharging home.    Follow Up Recommendations  SNF;Supervision/Assistance - 24 hour    Equipment Recommendations  Other (comment) (TBD next venue of care)    Recommendations for Other Services  None at this time   Precautions / Restrictions Precautions Precautions: Fall Precaution Comments: abdominal drain on right,  G tube, abdominal binder loosely at all times Required Braces or Orthoses: Other Brace/Splint Other Brace/Splint: abdominal binder Restrictions Weight Bearing Restrictions: No     Mobility Bed Mobility Overal bed mobility: Needs Assistance Bed Mobility:  Rolling;Supine to Sit Rolling: Min guard   Supine to sit: Min guard;HOB elevated     General bed mobility comments: Min guard needed for safety. Cues for technique.   Transfers Overall transfer level: Needs assistance Equipment used: None;1 person hand held assist Transfers: Sit to/from [employer] Sit to Stand: Min assist Stand pivot transfers: Mod assist General transfer comment: Pt scared while standing and during stand pivot transfer. No RW used, just hand held assistance from therapist. Pt perseverated on use of RW.     Balance Overall balance assessment: Needs assistance Sitting-balance support: No upper extremity supported;Feet supported Sitting balance-Leahy Scale: Good     Standing balance support: No upper extremity supported;During functional activity Standing balance-Leahy Scale: Poor   ADL Overall ADL's : Needs assistance/impaired General ADL Comments: Patient engaged in bed mobility and sat EOB 10 minutes with no complaints of dizziness, just complaints of SOB. 02 sats checked=93 on 2 liters of 02 via [place]. Pt able to adjust socks in preparation for transfer. Pt performed stand pivot transfer with mod assist from therapist, pt with increased fearfullness of falling. Patient with increased SOB during transfer. Sats decreased >83% on room air, but quickly increased >91% with 2 liters donned, pursed lip breathing, and seated rest break. At end of session, left patient seated in recliner with BLEs elevated and positioned with pillows. Pt will require +2 for safety with nursing staff.      Cognition   Behavior During Therapy: WFL for tasks assessed/performed Overall Cognitive Status: Within Functional Limits for tasks assessed                 Pertinent  Vitals/ Pain       Pain Assessment: Faces Faces Pain Scale: No hurt         Frequency Min 2X/week     Progress Toward Goals  OT Goals(current goals can now be found in the care plan section)   Progress towards OT goals: Progressing toward goals   Plan Discharge plan remains appropriate       End of Session Equipment Utilized During Treatment: Gait belt;Rolling walker;Oxygen   Activity Tolerance Patient tolerated treatment well   Patient Left in chair;with call bell/phone within reach     Time: 0916-0945 OT Time Calculation (min): 29 min  Charges: OT General Charges $OT Visit: 1 Procedure OT Treatments $Self Care/Home Management : 23-37 mins  Nazire Fruth , MS, OTR/L, CLT Pager: [phone]  09/08/2014, 9:59 AM

## 2014-09-08 NOTE — Procedures (Signed)
I was present at this session.  I have reviewed the session itself and made appropriate changes.  bp runs 90s,cath working well.    [name] L 6/7/20162:53 PM

## 2014-09-08 NOTE — Progress Notes (Signed)
Subjective: Interval History: has complaints very anxious, many issues wrong.  Objective: Vital signs in last 24 hours: Temp:  [96.4 F (35.8 C)-98.4 F (36.9 C)] 98.4 F (36.9 C) (06/07 0446) Pulse Rate:  [99-108] 99 (06/07 0446) Resp:  [18-37] 18 (06/07 0446) BP: (90-107)/(65-77) 90/65 mmHg (06/07 0446) SpO2:  [93 %-100 %] 96 % (06/07 0446) Weight change:   Intake/Output from previous day: 06/06 0701 - 06/07 0700 In: 2184.8 [P.O.:240; I.V.:570; NG/GT:1034.8; IV Piggyback:300] Out: -  Intake/Output this shift:    General appearance: alert and very anxious Resp: diminished breath sounds bilaterally and rales bibasilar Chest wall: RIJ PC Cardio: S1, S2 normal and systolic murmur: holosystolic 2/6, blowing at apex GI: PEG L midupper abdm, [place] R mid upper abdm Extremities: edema 1+ and AVF LUA  Lab Results: No results for input(s): WBC, HGB, HCT, PLT in the last 72 hours. BMET: No results for input(s): NA, K, CL, CO2, GLUCOSE, BUN, CREATININE, CALCIUM in the last 72 hours. No results for input(s): PTH in the last 72 hours. Iron Studies: No results for input(s): IRON, TIBC, TRANSFERRIN, FERRITIN in the last 72 hours.  Studies/Results: Dg Sinus/fist Tube Chk-non Gi  09/07/2014   CLINICAL DATA:  Evaluate for persistence of fistula.  EXAM: ABSCESS INJECTION  TECHNIQUE: Under direct fluoroscopic guidance water-soluble contrast material was injected through the right-sided percutaneous drainage catheter.  CONTRAST:  12mL OMNIPAQUE IOHEXOL 300 MG/ML  SOLN  FLUOROSCOPY TIME:  Radiation Exposure Index (as provided by the fluoroscopic device):  If the device does not provide the exposure index:  Fluoroscopy Time (in minutes and seconds):  48 seconds  Number of Acquired Images:  6  COMPARISON:  07/27/2014  FINDINGS: Since the previous exam the right-sided percutaneous drainage catheter appears to have been pulled back. Injection of water-soluble contrast material through the catheter  was performed. No extravasation of contrast material into the peritoneal cavity identified. The contrast material preferentially flowed in a retrograde fashion around the catheter to the skin surface. A thin, short sinus tract was noted extending from the tip of the catheter. No opacification of duodenum noted.  IMPRESSION: 1. No evidence for free extravasation of contrast material in the intraperitoneal cavity in no evidence for communication with the duodenum. 2. The ingested contrast material preferentially flowed in a retrograde fashion towards the skin surface.   Electronically Signed   By: Signa Kellaylor  Stroud M.D.   On: 09/07/2014 15:05    I have reviewed the patient's current medications.  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 Fortaz d/c, still on Flagyl, for injx or drain 6 Access for op per Dr. 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

## 2014-09-08 NOTE — Progress Notes (Addendum)
PROGRESS NOTE  [name] ZOX:[id number] DOB: 12/01/1955 DOA: 06/17/2014 PCP: [name], MD  Brief Narrative: 59 yo ? smoker with hx HTN, COPD, CKD IV followed @ [facility]-[name] Page, failed L AV fistula, initially admitted 3/16 with flu and acute hypoxic resp failure. Ultimately tx to [facility] due to need for HD. L sided weakness 3/18 and found to have R ACA stroke-showed acute infarct R ACA (3/19). Patient more lethargic 3/30 and CT abd=perf viscus 3/30. She was taken to the OR for ex lap for perforated pyloric ulcer with diffuse peritonitis, ischemic necrosis of mid ileum. Patient developed septic shock on 4-1, she was started on pressors and wean off pressors on 4-3. Subsequently, she was diagnosed with pelvic abscess by CT scan perform on 4-7. She underwent pelvic drain placement by IR on 4/8= culture neg 08/15/14 another drained placed in the pelvic cul-de-sac abscess with culture = pseudomonas. On 08/20/14, Gluteal drain was inadvertently pulled out. Repeat CT scan shows improvement in the fluid collection so decision was to leave it out and continue IV abx. Pt hospital stay also complicated by high output duodenal (enterocutaneous fistula) fistula. As a result, pt was made npo and started on TPN. Now stable, on TNA +high output from fistula/leak per [facility]. Had a G_J tube placed by Dr. [name] of IR and Gen surgery has continued to assist with management of her multiple surgical comorbidities C. diff diagnosed 4-12-initially unresponsive to flagyl, and she was started on PO vancomycin 4-16 which she finished 3 week.  5/27--TPN weaned off and increasing TF. Hospital course complicated by SVT, atrial tachycardia.   4/27 CT angio negative for PE. Cardiology was following, now on amiodarone, lopressor Temporarily reuired BIPAP 5/2 for hypotension, hypoxia and SVT in HD, transferred to SDU.Marland Kitchen She was taken off BIPAP. This recurred on 5/15 with episodic SVT. Since then she has had  intermittent short episodes of SVT, but on 08/28/14 she has had more sustained SVT,  Improved since then on higher dose Amio. -Subsequently started diet and tolerating this well, Management per [facility]; plan for fistula study today -to CIR when cleared by Surgery -also new HD and plan for AVF revision per VVS on Wednesday 6/8   Assessment/Plan: Status post exploratory laparotomy with closure of perforated pyloric ulcer/abdominal abscess / small bowel resection w/ postop ileus and now with enterocutaneous fistula. Pelvic abscess s/p percutaneous drain placement  -completed, long courses of IV  Abx including Vanc and FLuconazole -Due to Fistula, she was made NPO, TPN was started -Fistula study done shows JP drain connection to the duodenal bulb-5-02. - 08/15/2014 repeat CT abdomen with 3.0 x 3.5 x 5.7 cm collection in the pelvic cul-de-sac and extraluminal air alongside JP drain in anterior abd wall -had high output from Fistula/leak. Had G_J placed by IR 08/12/14 at request of gen surgery -CT repeat shows 5 cm post cul-de-sac abscess which was drained on 5/16-culture revealed pseudomonas aeruginosa -ID was consulted as patient had Pseudomonas growing from her culture wounds and they started back Flagyl as well as Elita Quick on 5/16  -Per ID recommendation--just completed 21/21 days of Fortaz on 6/6 and now on D#22/ 28 days metronidazole -Patient inadvertently pulled out her gluteal/cul-de-sac drain May 19. Seen by interventional radiology and they recommended repeat CT scan, which was done. Shows that the fluid collection has decreased in size. They do not plan to replace the drain at this time. Continue with antibiotics alone.  -08/21/2014  CT scan did suggest a transmural defect in the stomach wall near the patient's pyloric Cheree Ditto patch. -Patient accidentally pulled out her JP drain on 5/22. This has been replaced by surgery on 5/23. -Octreotide was discontinued 5/23--enterocutaneous fistula output  appears to be decreasing gradually -finished TPN on 08/27/14 -Enteral Feeding was increased 5/27 to 55 mL per hour--tolerating without residuals -08/29/14--started clear liquids-->tolerating well -08/31/14--pt inadvertently pulled out R-side JP drain again (3rd time) during therapy + GJ tube with bloody drainage--contacted general surgery 09/01/14--IR replaced RUQ drain (entercutaneous fistula)--reminded pt to wear abd binder at all times; no output from this drain 6/1: started full liquids  6/3: started regular diet: ATE a Hamburger!!-Huge milestone as far as patient is concerned DC to CIR when cleared for discharge by [facility], does she need repeat  CT of abd Fistula study 6/6 unremarkable  PSVT --Atrial tachycardia -Felt to be secondary to fluid metabolic shifts from ESRD and ongoing abd issues; also has severe anxiety with dialysis as well as COPD/Cor Pulmonale  -was on IV metoprolol and amiodarone, now 5/31 switched to PO  -CTA 4/27, negative for PE  -Cards following peripherally, HR fluctuates in HD  ESRD new, now on HD this admit complicated by intermittent hypotension AKI on CKD 4, due to sepsis, shock Ongoing hemodialysis per Nephrology - perm-cath placed 3/26 d/t AVF problems Patient AV fistula reviewed and not mature yet per Dr. Darrick Penna -Continue hemodialysis Tuesday, Thursday, Saturday while the patient is in the hospital -Xanax with each HD due to significant anxiety with HD -starting to sit up with HD now,, plan for AVF revision by VVS tomorrow  C. difficile colitis still on contact precautions -C diff positive on 4-12, Received 4 days of flagyl without improvement, then started on PO vanc -Completed 3 weeks of Oral Vanc-stopped 5/7, this was 1 week after IV Zosyn was stopped 5/5 -5/28--having loose stools again-->Cdiff PCR negative -keep on contact precautions, now on Po flagyl till 6/13, to stop 1 week after [place] for pseudomonas completed 6/6  ACA CVA with Lt sided  weakness -06/23/14--TEE ;normal LV function; no LAA thrombus -Started on ASA 81mg  per Neuro; resumed this -Will need ongoing long term rehab - PT/OT to cont to follow while inpatient -Patient remains very weak and deconditioned. 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 Fluids/volume managed with hemodialysis.  Elevated PA pressures on 2-D echo; CTA negative for PE - 07/23/2014 echo-- EF 60-61%, grade 1 diastolic dysfunction, Severe RV dilatation, PAP 90  Small PFO Has been evaluated by Cardiology. No intervention planned currently.  Anemia of critical illness/chronic disease -and bleeding from leak/fistula which has resolved. -s/p 2U PRBC 4/13 and 5/10 and 5/26 -Aranesp/iron per renal -transfuse for hemoglobin less than 7, stable  Acute hypoxic Respiratory Failure:  -This was secondary to H1N1, hypervolemia, HCAP >> resolved.  -also has COPD and Cor Pulmonale - PRN Xopenex and Atrovent, - presently stable on 2 L-3L  Thrombocytopenia; resolved.  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 TSH in 7 range -continue low dose synthroid  given PSVT, changed to PO synthroid  L Renal mass  -noted on [place], Indeterminate 1.9 cm hypoechoic mass off the interpolar region of the left kidney -may potentially represent a cyst however solid mass is not excluded -needs outpatient FU for this  Fall on May 18  DVT prophylaxis: SCDs Code Status: FULL Family Communication: Discussed with patient. No family at  bedside. Disposition Plan: Tx to tele 6/6 Not accepted to LTAC, CIR when stable per [facility]  Procedures:  06/23/14--TEE ;normal LV function; no LAA thrombus  3/26: Dr.Lawson VVS: INSERTION OF Right Internal Jugular DIATEK CATHETER-19 cm  3/30: Dr.Ingram: Exploratory laparotomy, evacuation of ascites, closure of perforated pyloric ulcer with omental patch, segmental resection of 14 inches of ileum with primary anastomosis  4/8: by IR Dr.Schick: CT GUIDED pelvic abscess drain insertion  4/27: Successful LT IJ TUNNELED DL POWER PICC Dr.Schick, IR  5/11: Dr.Hassell IR : Gastrostomy tube: 52f Coax DL G_J placed  1/61: Dr.Henn IR: CT guided drain placement in pelvic abscess collection  5/31: Dr.Henn:  IR catheter tube change: duodenal fistula: for Surgical drain in RUQ that fell out  6/8: planned AVF revision by VVS   Consultants: Cardiology Nephrology PCCM Gen Surgery  Palliative care    Procedures/Studies: Ct Abdomen Pelvis Wo Contrast  08/22/2014   CLINICAL DATA:  Abdominal abscess. No intravenous contrast due to renal failure.  EXAM: CT ABDOMEN AND PELVIS WITHOUT CONTRAST  TECHNIQUE: Multidetector CT imaging of the abdomen and pelvis was performed following the standard protocol without IV contrast.  COMPARISON:  08/15/2014  FINDINGS: BODY WALL: Open laparotomy without fluid collection.  LOWER CHEST: Chronic triangular opacity in the posterior costophrenic sulcus on the left, atelectasis versus scarring  ABDOMEN/PELVIS:  Liver: No focal abnormality.  Biliary: High-density material within the gallbladder showing mild wall thickening which is chronic.  Pancreas: Unremarkable.  Spleen: Unremarkable.  Adrenals: Unremarkable.  Kidneys and ureters: No hydronephrosis or stone. Presumed 1 cm cyst from the interpolar left kidney  Bladder: Decompressed.  No pathologic findings.  Reproductive: Calcified and noncalcified uterine fibroids, better seen on previous enhanced imaging.  Bowel: Right  upper quadrant drain related to perforated pyloric ulcer status post Cheree Ditto patch. Specially visible on coronal imaging, there is a gas channel through the pylorus measuring 6 to 9 mm in diameter. No bowel obstruction. Enteroenterostomy noted in the pelvis. Oral contrast again seen in the colon. No notable colonic wall thickening in this patient with history of C difficile. Negative appendix. Percutaneous gastrojejunostomy tube is in good position.  Retroperitoneum: No mass or adenopathy.  Peritoneum: Known thick walled collection in the rectovaginal recess smaller than 08/15/2014 at 40 x 16 mm as compared to 51 x 32 mm. No new collection is identified. No free pneumoperitoneum.  Vascular: 3 cm fusiform infrarenal aortic aneurysm.  OSSEOUS: No acute abnormalities.  IMPRESSION: 1. Near the patient's pyloric Cheree Ditto patch is a transmural defect in the stomach wall, contiguous with the surgical drain. If repeat surgery is considered, oral contrast could confirm an open ulcer. 2. Decreased rectovaginal recess fluid collection, now 4 x 1.5 cm (previously 5 x 3 cm).   Electronically Signed   By: Marnee Spring M.D.   On: 08/22/2014 01:49   Ct Abdomen Pelvis Wo Contrast  08/15/2014   CLINICAL DATA:  Intra abdominal abscess.  EXAM: CT ABDOMEN AND PELVIS WITHOUT CONTRAST  TECHNIQUE: Multidetector CT imaging of the abdomen and pelvis was performed following the standard protocol without IV contrast.  COMPARISON:  CT scan of  Aug 03, 2014.  FINDINGS: Severe degenerative disc disease is noted at L5-S1. Minimal subsegmental atelectasis is noted posteriorly in the left lung base.  No gallstones are noted. No focal abnormality is noted in the liver, spleen or pancreas on these unenhanced images. Adrenal glands appear normal. Stable exophytic cyst is seen arising from midpole of left kidney. No hydronephrosis or renal obstruction is noted. No renal or ureteral calculi are noted. 3 cm infrarenal abdominal aortic aneurysm is noted.  Gastrojejunostomy tube is seen entering stomach percutaneously and with distal tip in the jejunum. JP surgical drain is seen entering right upper quadrant of abdomen underneath the liver with distal tip in the left upper quadrant. No significant fluid collection is noted around the drain. Fluid collection is again identified and posterior cul-de-sac of pelvis measuring 5.1 x 3.2 cm. Calcified degenerating fibroid is noted in the uterus. Urinary bladder is unremarkable. There is no evidence of bowel obstruction. The appendix appears normal. No significant adenopathy is noted. Large midline surgical anterior abdominal wall incision is noted.  IMPRESSION: Stable 3 cm infrarenal abdominal aortic aneurysm.  No change in position of JP surgical drain with distal tip in the left upper quadrant of the abdomen.  Interval placement of percutaneous gastrojejunostomy tube with distal tip in the jejunum.  Grossly stable size and appearance of 5 cm fluid collection seen in posterior cul-de-sac of the pelvis.  Continued presence uterine fibroids.   Electronically Signed   By: Lupita Raider, M.D.   On: 08/15/2014 15:15   Dg Chest 1 View  08/13/2014   CLINICAL DATA:  Shortness of breath  EXAM: CHEST  1 VIEW  COMPARISON:  08/03/2014  FINDINGS: Moderately severe cardiac enlargement. Central line in unchanged position on the right and left side. Mild vascular congestion with no evidence of edema effusion or consolidation.  IMPRESSION: No acute findings   Electronically Signed   By: Esperanza Heir M.D.   On: 08/13/2014 11:04   Dg Chest 2 View  09/01/2014   CLINICAL DATA:  Followup respiratory failure  EXAM: CHEST  2 VIEW  COMPARISON:  08/23/2014  FINDINGS: Moderate cardiac enlargement. Left IJ catheter is identified with tip in the SVC. There is a right-sided dialysis catheter with tips in the SVC. No pleural effusion or edema. No airspace consolidation.  IMPRESSION: 1. Lungs remain clear. 2. Catheters positioned as above.    Electronically Signed   By: Signa Kell M.D.   On: 09/01/2014 09:04   Ir Gastrostomy Tube Mod Sed  08/12/2014   CLINICAL DATA:  Perforated duodenal ulcer, post g patch with persistent leak. Percutaneous gastrojejunostomy as requested for gastric decompression and small bowel enteral feeding support.  EXAM: PERC PLACEMENT GASTROSTOMY;  CONVERT G-TUBE TO G-JTUBE  FLUOROSCOPY TIME:  16 minutes 24 seconds, 1016.6 mGy  TECHNIQUE: The procedure, risks, benefits, and alternatives were explained to the patient. Questions regarding the procedure were encouraged and answered. The patient understands and consents to the procedure. As antibiotic prophylaxis, cefazolin 2 g was ordered pre-procedure and administered intravenously within one hour of incision. . A 5 French angiographic catheter was placed as orogastric tube. The upper abdomen was prepped with Betadine, draped in usual sterile fashion, and infiltrated locally with 1% lidocaine. 1 mg glucagon was administered intravenously to facilitate gastric distention and slow peristalsis. Stomach was insufflated using air through the orogastric tube. An 55 French sheath needle was advanced percutaneously into the gastric lumen under fluoroscopy. Gas could be aspirated and a small contrast injection  confirmed intraluminal spread. The sheath was exchanged over a guidewire for a 9 [place] vascular sheath, through which the snare device was advanced and used to snare a guidewire passed through the orogastric tube. This was withdrawn, and the snare attached to the 20 French pull-through gastrostomy tube, which was advanced antegrade, positioned with the internal bumper securing the anterior gastric wall to the anterior abdominal wall. Small contrast injection confirms appropriate positioning. The external bumper was applied and the catheter was flushed.  A 5 French angiographic catheter was placed coaxially through the gastrostomy tube and used to direct an angled stiff  glidewire across the pylorus, beyond the proximal duodenal perforation, and into the proximal jejunum just beyond the ligament of Treitz. Over this, a coaxial jejunostomy feeding adapter was advanced. Contrast injection confirmed patency and appropriate position of the gastric and jejunal lumens. No extravasation. The lumens were flushed with saline and capped. The patient tolerated the procedure well.  COMPLICATIONS: COMPLICATIONS none  IMPRESSION: 1. Technically successful 20 French pull-through gastrostomy placement under fluoroscopy. 2. Technically successful coaxial jejunostomy feeding adapter placement to the ligament of Treitz.   Electronically Signed   By: Corlis Leak M.D.   On: 08/12/2014 16:12   Ir Catheter Tube Change  09/01/2014   CLINICAL DATA:  History of a duodenal fistula. Surgical drain recently fell out and request for drain replacement.  EXAM: REPLACEMENT OF PERCUTANEOUS DRAIN WITH FLUOROSCOPY  Physician: Rachelle Hora. Henn, MD  FLUOROSCOPY TIME:  3 minutes, 246 mGy  MEDICATIONS AND MEDICAL HISTORY: None  ANESTHESIA/SEDATION: Moderate sedation time: None  CONTRAST:  15 mL Omnipaque-300  PROCEDURE: The procedure was explained to the patient. The risks and benefits of the procedure were discussed and the patient's questions were addressed. Informed consent was obtained from the patient. The right upper abdomen was prepped and draped in sterile fashion. Maximal barrier sterile technique was utilized including caps, mask, sterile gowns, sterile gloves, sterile drape, hand hygiene and skin antiseptic. A 5 French catheter was advanced through the old drain site. Contrast was injected as the catheter was advanced into the tissue. Contrast injection demonstrated a connection to the duodenum bulb and stomach. Catheter and wire preferentially went into the stomach via the duodenal bulb. A series of wires were used to advance a 12 French [employer] catheter into the abdomen. The tip of the catheter was  cut in order to advance the catheter over a wire. The tip was placed adjacent to the duodenal fistula but not within the bowel. Catheter was sutured to the skin. Catheter was attached to gravity bag.  FINDINGS: Focal fistula connection between the drain tract and the duodenal bulb. New 12 French catheter was placed within the fistula tract but outside of the bowel.  Estimated blood loss: Minimal  COMPLICATIONS: None  IMPRESSION: Successful replacement of the percutaneous drain. Demonstration of the duodenal bulb fistula. Drain was placed adjacent to the fistula opening but outside of the bowel.   Electronically Signed   By: Richarda Overlie M.D.   On: 09/01/2014 13:39   Ir Adele Schilder Tamsen Snider Convert Gastr-jej Per W/fl Mod Sed  08/12/2014   CLINICAL DATA:  Perforated duodenal ulcer, post g patch with persistent leak. Percutaneous gastrojejunostomy as requested for gastric decompression and small bowel enteral feeding support.  EXAM: PERC PLACEMENT GASTROSTOMY;  CONVERT G-TUBE TO G-JTUBE  FLUOROSCOPY TIME:  16 minutes 24 seconds, 1016.6 mGy  TECHNIQUE: The procedure, risks, benefits, and alternatives were explained to the patient. Questions regarding the procedure were  encouraged and answered. The patient understands and consents to the procedure. As antibiotic prophylaxis, cefazolin 2 g was ordered pre-procedure and administered intravenously within one hour of incision. . A 5 French angiographic catheter was placed as orogastric tube. The upper abdomen was prepped with Betadine, draped in usual sterile fashion, and infiltrated locally with 1% lidocaine. 1 mg glucagon was administered intravenously to facilitate gastric distention and slow peristalsis. Stomach was insufflated using air through the orogastric tube. An 65 French sheath needle was advanced percutaneously into the gastric lumen under fluoroscopy. Gas could be aspirated and a small contrast injection confirmed intraluminal spread. The sheath was exchanged over a  guidewire for a 9 [place] vascular sheath, through which the snare device was advanced and used to snare a guidewire passed through the orogastric tube. This was withdrawn, and the snare attached to the 20 French pull-through gastrostomy tube, which was advanced antegrade, positioned with the internal bumper securing the anterior gastric wall to the anterior abdominal wall. Small contrast injection confirms appropriate positioning. The external bumper was applied and the catheter was flushed.  A 5 French angiographic catheter was placed coaxially through the gastrostomy tube and used to direct an angled stiff glidewire across the pylorus, beyond the proximal duodenal perforation, and into the proximal jejunum just beyond the ligament of Treitz. Over this, a coaxial jejunostomy feeding adapter was advanced. Contrast injection confirmed patency and appropriate position of the gastric and jejunal lumens. No extravasation. The lumens were flushed with saline and capped. The patient tolerated the procedure well.  COMPLICATIONS: COMPLICATIONS none  IMPRESSION: 1. Technically successful 20 French pull-through gastrostomy placement under fluoroscopy. 2. Technically successful coaxial jejunostomy feeding adapter placement to the ligament of Treitz.   Electronically Signed   By: Corlis Leak M.D.   On: 08/12/2014 16:12   Dg Sinus/fist Tube Chk-non Gi  09/07/2014   CLINICAL DATA:  Evaluate for persistence of fistula.  EXAM: ABSCESS INJECTION  TECHNIQUE: Under direct fluoroscopic guidance water-soluble contrast material was injected through the right-sided percutaneous drainage catheter.  CONTRAST:  12mL OMNIPAQUE IOHEXOL 300 MG/ML  SOLN  FLUOROSCOPY TIME:  Radiation Exposure Index (as provided by the fluoroscopic device):  If the device does not provide the exposure index:  Fluoroscopy Time (in minutes and seconds):  48 seconds  Number of Acquired Images:  6  COMPARISON:  07/27/2014  FINDINGS: Since the previous exam the  right-sided percutaneous drainage catheter appears to have been pulled back. Injection of water-soluble contrast material through the catheter was performed. No extravasation of contrast material into the peritoneal cavity identified. The contrast material preferentially flowed in a retrograde fashion around the catheter to the skin surface. A thin, short sinus tract was noted extending from the tip of the catheter. No opacification of duodenum noted.  IMPRESSION: 1. No evidence for free extravasation of contrast material in the intraperitoneal cavity in no evidence for communication with the duodenum. 2. The ingested contrast material preferentially flowed in a retrograde fashion towards the skin surface.   Electronically Signed   By: Signa Kell M.D.   On: 09/07/2014 15:05   Dg Chest Port 1 View  08/23/2014   CLINICAL DATA:  Left chest pain  EXAM: PORTABLE CHEST - 1 VIEW  COMPARISON:  08/18/2014  FINDINGS: Patient is rotated.  Lungs are essentially clear. No focal consolidation. No pleural effusion or pneumothorax.  Cardiomegaly.  Left IJ venous catheter terminates at the cavoatrial junction. Right IJ dual lumen dialysis catheter terminates in the lower SVC.  IMPRESSION: No evidence of acute cardiopulmonary disease.   Electronically Signed   By: Charline Bills M.D.   On: 08/23/2014 19:17   Dg Chest Port 1 View  08/18/2014   CLINICAL DATA:  Shortness of breath at rest.  Having hemodialysis.  EXAM: PORTABLE CHEST - 1 VIEW  COMPARISON:  08/15/2014.  FINDINGS: The cardiac silhouette remains mildly enlarged. Stable right jugular double-lumen catheter. Clear lungs. The pulmonary vasculature remains prominent. No pleural fluid. Minimal right shoulder degenerative changes.  IMPRESSION: Stable cardiomegaly and pulmonary vascular congestion.   Electronically Signed   By: Beckie Salts M.D.   On: 08/18/2014 09:47   Dg Chest Port 1 View  08/15/2014   CLINICAL DATA:  Shortness of breath.  EXAM: PORTABLE CHEST - 1  VIEW  COMPARISON:  08/13/2014 and multiple prior chest radiographs  FINDINGS: Cardiomegaly and pulmonary vascular congestion again noted.  A right IJ central venous catheter is noted with tips overlying the upper and mid SVC.  A left central venous catheter is present with tip overlying the lower SVC.  There is no evidence of focal airspace disease, pulmonary edema, suspicious pulmonary nodule/mass, pleural effusion, or pneumothorax. No acute bony abnormalities are identified.  IMPRESSION: Cardiomegaly with pulmonary vascular congestion.   Electronically Signed   By: Harmon Pier M.D.   On: 08/15/2014 17:28   Dg Abd Portable 1v  08/23/2014   CLINICAL DATA:  JP drain, broken, initial encounter  EXAM: PORTABLE ABDOMEN - 1 VIEW  COMPARISON:  CT abdomen pelvis dated 08/21/2014  FINDINGS: A portion of the right flank is excluded from this single image.  The patient's right abdominal surgical drain is not visualized.  Gastrojejunostomy in satisfactory position.  IMPRESSION: The patient's right abdominal surgical drain is not visualized.  Gastrojejunostomy in satisfactory position.   Electronically Signed   By: Charline Bills M.D.   On: 08/23/2014 19:11   Ct Image Guided Fluid Drain By Catheter  08/17/2014   CLINICAL DATA:  Postoperative intra-abdominal abscess, subsequent encounter. Small pelvic fluid collection.  EXAM: CT-GUIDED DRAIN PLACEMENT IN PELVIC FLUID COLLECTION  Physician: Rachelle Hora. Lowella Dandy, MD  FLUOROSCOPY TIME:  None  MEDICATIONS: 50 mcg fentanyl  ANESTHESIA/SEDATION: Patient was monitored by a radiology nurse during the procedure.  PROCEDURE: Informed consent was obtained for drain placement. Patient was placed on her left side. Images through the pelvis were obtained. The right buttock was prepped and draped in sterile fashion. 1% lidocaine was used for a local anesthetic. An 18 gauge needle was directed into the pelvic fluid collection with CT guidance from a transgluteal approach. Thick yellow purulent  fluid was aspirated. A stiff Amplatz wire was placed. The tract was dilated to accommodate a 10.2 [place] multipurpose drain. It was technically difficult to advance the drain into the collection due to the small size of the collection and the patient's body habitus. Eventually, the drain was successfully placed within the collection. 5 mL of yellow purulent fluid was obtained. Catheter was sutured to the skin and attached to a suction bulb. Fluid was sent for culture.  FINDINGS: Small fluid collection just posterior to the uterus. A total of 5 mL of yellow purulent fluid was removed.  Estimated blood loss: Minimal  COMPLICATIONS: None  IMPRESSION: CT-guided placement of a drainage catheter in the small pelvic fluid collection.   Electronically Signed   By: Richarda Overlie M.D.   On: 08/17/2014 17:21        Subjective: No events overnight, sitting eating breakfast,  Mild  chronic abd pain, unchanged, breathing at baseline  Objective: Filed Vitals:   09/08/14 1341 09/08/14 1400 09/08/14 1430 09/08/14 1455  BP: 90/53 84/51 79/33  107/58  Pulse: 108 101 101 102  Temp:    97.8 F (36.6 C)  TempSrc:    Oral  Resp: 21 28 29 28   Height:      Weight:      SpO2:    99%    Intake/Output Summary (Last 24 hours) at 09/08/14 1646 Last data filed at 09/08/14 1455  Gross per 24 hour  Intake 1854.83 ml  Output   2517 ml  Net -662.17 ml   Weight change:  Exam:   General:  Pt is alert, follows commands appropriately, not in acute distress, chronically ill appearing, seen sitting in recliner  HEENT: No icterus,  [place]/AT  Cardiovascular: RRR, S1/S2, no rubs, no gallops  Respiratory: basilar ronchi, Good air movement.  Abdomen: Soft/+BS, non tender, non distended, no guarding, 2drains noted, L sided drain with mod amount of greenish output and R sided drain without any output  Extremities: trace LE edema, No lymphangitis, No petechiae, No rashes, no synovitis  Data Reviewed: Basic Metabolic  Panel:  Recent Labs Lab 09/03/14 0350 09/04/14 0430 09/05/14 0532 09/06/14 1230 09/08/14 0500 09/08/14 1100  NA 137 135 135  --  130* 129*  K 4.2 4.3 4.5  --  4.1 4.2  CL 97* 99* 94*  --  90* 89*  CO2 26 27 26   --  23 23  GLUCOSE 93 92 97  --  82 78  BUN 37* 19 33*  --  41* 42*  CREATININE 4.67* 2.86* 4.32*  --  5.29* 5.32*  CALCIUM 8.2* 7.7* 8.4*  --  8.1* 8.0*  MG  --   --   --  1.8  --   --   PHOS  --   --   --   --   --  8.5*   Liver Function Tests:  Recent Labs Lab 09/08/14 1100  ALBUMIN 2.1*   No results for input(s): LIPASE, AMYLASE in the last 168 hours. No results for input(s): AMMONIA in the last 168 hours. CBC:  Recent Labs Lab 09/03/14 0350 09/04/14 0430 09/05/14 0532 09/08/14 0500  WBC 11.5* 9.9 11.1* 9.7  HGB 9.3* 9.0* 9.2* 9.2*  HCT 29.7* 28.8* 29.4* 28.7*  MCV 94.6 93.8 92.7 90.8  PLT 208 190 253 368   Cardiac Enzymes: No results for input(s): CKTOTAL, CKMB, CKMBINDEX, TROPONINI in the last 168 hours. BNP: Invalid input(s): POCBNP CBG:  Recent Labs Lab 09/01/14 2350  GLUCAP 97    Recent Results (from the past 240 hour(s))  Clostridium Difficile by PCR     Status: None   Collection Time: 08/31/14  2:35 PM  Result Value Ref Range Status   C difficile by pcr NEGATIVE NEGATIVE Final     Scheduled Meds: . ALPRAZolam  0.5 mg Oral Q T,Th,Sa-HD  . amiodarone  400 mg Oral BID  . antiseptic oral rinse  7 mL Mouth Rinse q12n4p  . aspirin  81 mg Oral Daily  . budesonide (PULMICORT) nebulizer solution  0.25 mg Nebulization BID  . [START ON 09/09/2014] cefUROXime (ZINACEF)  IV  1.5 g Intravenous To OR  . chlorhexidine  15 mL Mouth Rinse BID  . darbepoetin (ARANESP) injection - DIALYSIS  200 mcg Intravenous Q Thu-HD  . feeding supplement (RESOURCE BREEZE)  1 Container Oral TID BM  . feeding supplement (VITAL AF 1.2 CAL)  1,000 mL Per Tube Q24H  . ipratropium  0.5 mg Nebulization TID  . levothyroxine  25 mcg Oral QAC breakfast  . metoprolol  tartrate  12.5 mg Oral BID  . metronidazole  500 mg Intravenous Q8H  . multivitamin  1 tablet Oral QHS  . sodium chloride  10-40 mL Intracatheter Q12H   Continuous Infusions: . sodium chloride 10 mL/hr at 08/19/14 0950     Zannie Cove, MD  [facility] Pager [phone]  If 7PM-7AM, please contact night-coverage [email or web address] Password [email or web address] 09/08/2014, 4:46 PM   LOS: 83 days

## 2014-09-09 ENCOUNTER — Inpatient Hospital Stay (HOSPITAL_COMMUNITY): Payer: Medicaid Other | Admitting: Anesthesiology

## 2014-09-09 ENCOUNTER — Encounter (HOSPITAL_COMMUNITY): Payer: Self-pay | Admitting: Anesthesiology

## 2014-09-09 ENCOUNTER — Encounter (HOSPITAL_COMMUNITY)
Admission: EM | Disposition: A | Discharge status: Rehabilitation Facility | Payer: Self-pay | Source: Home / Self Care | Attending: Internal Medicine

## 2014-09-09 HISTORY — PX: REVISON OF ARTERIOVENOUS FISTULA: SHX6074

## 2014-09-09 LAB — BASIC METABOLIC PANEL
ANION GAP: 13 (ref 5–15)
BUN: 21 mg/dL — ABNORMAL HIGH (ref 6–20)
CHLORIDE: 94 mmol/L — AB (ref 101–111)
CO2: 26 mmol/L (ref 22–32)
Calcium: 8 mg/dL — ABNORMAL LOW (ref 8.9–10.3)
Creatinine, Ser: 3.58 mg/dL — ABNORMAL HIGH (ref 0.44–1.00)
GFR calc Af Amer: 15 mL/min — ABNORMAL LOW (ref >60)
GFR calc non Af Amer: 13 mL/min — ABNORMAL LOW (ref >60)
GLUCOSE: 105 mg/dL — AB (ref 65–99)
POTASSIUM: 3.7 mmol/L (ref 3.5–5.1)
SODIUM: 133 mmol/L — AB (ref 135–145)

## 2014-09-09 LAB — RENAL FUNCTION PANEL
Albumin: 2.1 g/dL — ABNORMAL LOW (ref 3.5–5.0)
Anion gap: 14 (ref 5–15)
BUN: 29 mg/dL — AB (ref 6–20)
CALCIUM: 8.4 mg/dL — AB (ref 8.9–10.3)
CO2: 25 mmol/L (ref 22–32)
Chloride: 96 mmol/L — ABNORMAL LOW (ref 101–111)
Creatinine, Ser: 4.35 mg/dL — ABNORMAL HIGH (ref 0.44–1.00)
GFR, EST AFRICAN AMERICAN: 12 mL/min — AB (ref >60)
GFR, EST NON AFRICAN AMERICAN: 10 mL/min — AB (ref >60)
Glucose, Bld: 166 mg/dL — ABNORMAL HIGH (ref 65–99)
Phosphorus: 7.7 mg/dL — ABNORMAL HIGH (ref 2.5–4.6)
Potassium: 3.6 mmol/L (ref 3.5–5.1)
Sodium: 135 mmol/L (ref 135–145)

## 2014-09-09 LAB — CBC
HCT: 30.1 % — ABNORMAL LOW (ref 36.0–46.0)
HEMATOCRIT: 30.3 % — AB (ref 36.0–46.0)
Hemoglobin: 9.3 g/dL — ABNORMAL LOW (ref 12.0–15.0)
Hemoglobin: 9.5 g/dL — ABNORMAL LOW (ref 12.0–15.0)
MCH: 28.7 pg (ref 26.0–34.0)
MCH: 29.2 pg (ref 26.0–34.0)
MCHC: 30.9 g/dL (ref 30.0–36.0)
MCHC: 31.4 g/dL (ref 30.0–36.0)
MCV: 92.9 fL (ref 78.0–100.0)
MCV: 93.2 fL (ref 78.0–100.0)
Platelets: 376 10*3/uL (ref 150–400)
Platelets: 395 10*3/uL (ref 150–400)
RBC: 3.24 MIL/uL — AB (ref 3.87–5.11)
RBC: 3.25 MIL/uL — ABNORMAL LOW (ref 3.87–5.11)
RDW: 20.5 % — ABNORMAL HIGH (ref 11.5–15.5)
RDW: 20.3 % — AB (ref 11.5–15.5)
WBC: 9.2 10*3/uL (ref 4.0–10.5)
WBC: 8.8 10*3/uL (ref 4.0–10.5)

## 2014-09-09 LAB — PARATHYROID HORMONE, INTACT (NO CA): PTH: 346 pg/mL — AB (ref 15–65)

## 2014-09-09 LAB — SURGICAL PCR SCREEN
MRSA, PCR: NEGATIVE
Staphylococcus aureus: NEGATIVE

## 2014-09-09 SURGERY — REVISON OF ARTERIOVENOUS FISTULA
Anesthesia: Monitor Anesthesia Care | Site: Arm Upper | Laterality: Left

## 2014-09-09 MED ORDER — PROPOFOL INFUSION 10 MG/ML OPTIME
INTRAVENOUS | Status: DC | PRN
  Administered 2014-09-09: 75 ug/kg/min via INTRAVENOUS

## 2014-09-09 MED ORDER — LIDOCAINE HCL (CARDIAC) 20 MG/ML IV SOLN
INTRAVENOUS | Status: AC
  Filled 2014-09-09: qty 5

## 2014-09-09 MED ORDER — SODIUM CHLORIDE 0.9 % IJ SOLN
INTRAMUSCULAR | Status: AC
  Filled 2014-09-09: qty 10

## 2014-09-09 MED ORDER — MIDAZOLAM HCL 5 MG/5ML IJ SOLN
INTRAMUSCULAR | Status: DC | PRN
  Administered 2014-09-09 (×2): 1 mg via INTRAVENOUS

## 2014-09-09 MED ORDER — LIDOCAINE HCL (PF) 1 % IJ SOLN: 5.0000 mL | INTRAMUSCULAR | Status: DC | PRN

## 2014-09-09 MED ORDER — FENTANYL CITRATE (PF) 100 MCG/2ML IJ SOLN
25.0000 ug | INTRAMUSCULAR | Status: DC | PRN
  Administered 2014-09-09 (×2): 25 ug via INTRAVENOUS

## 2014-09-09 MED ORDER — SODIUM CHLORIDE 0.9 % IV SOLN
125.0000 mg | INTRAVENOUS | Status: DC
  Administered 2014-09-10: 125 mg via INTRAVENOUS
  Filled 2014-09-09 (×2): qty 10

## 2014-09-09 MED ORDER — FENTANYL CITRATE (PF) 250 MCG/5ML IJ SOLN
INTRAMUSCULAR | Status: AC
  Filled 2014-09-09: qty 5

## 2014-09-09 MED ORDER — LEVALBUTEROL HCL 0.63 MG/3ML IN NEBU
0.6300 mg | INHALATION_SOLUTION | Freq: Three times a day (TID) | RESPIRATORY_TRACT | Status: DC
  Administered 2014-09-10 – 2014-09-11 (×3): 0.63 mg via RESPIRATORY_TRACT
  Filled 2014-09-09 (×7): qty 3

## 2014-09-09 MED ORDER — PHENYLEPHRINE 40 MCG/ML (10ML) SYRINGE FOR IV PUSH (FOR BLOOD PRESSURE SUPPORT)
PREFILLED_SYRINGE | INTRAVENOUS | Status: AC
  Filled 2014-09-09: qty 10

## 2014-09-09 MED ORDER — MIDAZOLAM HCL 2 MG/2ML IJ SOLN
INTRAMUSCULAR | Status: AC
  Filled 2014-09-09: qty 2

## 2014-09-09 MED ORDER — FENTANYL CITRATE (PF) 100 MCG/2ML IJ SOLN
INTRAMUSCULAR | Status: AC
  Filled 2014-09-09: qty 2

## 2014-09-09 MED ORDER — PHENYLEPHRINE HCL 10 MG/ML IJ SOLN
INTRAMUSCULAR | Status: DC | PRN
  Administered 2014-09-09 (×2): 80 ug via INTRAVENOUS

## 2014-09-09 MED ORDER — SODIUM CHLORIDE 0.9 % IR SOLN
Status: DC | PRN
  Administered 2014-09-09: 1000 mL

## 2014-09-09 MED ORDER — PROPOFOL 10 MG/ML IV BOLUS
INTRAVENOUS | Status: AC
  Filled 2014-09-09: qty 20

## 2014-09-09 MED ORDER — HEPARIN SODIUM (PORCINE) 1000 UNIT/ML DIALYSIS: 1000.0000 [IU] | INTRAMUSCULAR | Status: DC | PRN

## 2014-09-09 MED ORDER — SODIUM CHLORIDE 0.9 % IV SOLN
INTRAVENOUS | Status: DC | PRN
  Administered 2014-09-09: 14:00:00 via INTRAVENOUS

## 2014-09-09 MED ORDER — EPHEDRINE SULFATE 50 MG/ML IJ SOLN
INTRAMUSCULAR | Status: AC
  Filled 2014-09-09: qty 1

## 2014-09-09 MED ORDER — OXYCODONE HCL 5 MG PO TABS: 5.0000 mg | ORAL_TABLET | Freq: Once | ORAL | Status: DC | PRN

## 2014-09-09 MED ORDER — LIDOCAINE-EPINEPHRINE 0.5 %-1:200000 IJ SOLN
INTRAMUSCULAR | Status: DC | PRN
Start: 2014-09-09 — End: 2014-09-09
  Administered 2014-09-09: 5 mL

## 2014-09-09 MED ORDER — SUCCINYLCHOLINE CHLORIDE 20 MG/ML IJ SOLN
INTRAMUSCULAR | Status: AC
  Filled 2014-09-09: qty 1

## 2014-09-09 MED ORDER — CALCITRIOL 0.5 MCG PO CAPS
0.5000 ug | ORAL_CAPSULE | ORAL | Status: DC
  Administered 2014-09-10: 0.5 ug via ORAL
  Filled 2014-09-09: qty 1

## 2014-09-09 MED ORDER — ANTICOAGULANT SODIUM CITRATE 4% (200MG/5ML) IV SOLN
5.0000 mL | Status: DC | PRN
  Filled 2014-09-09: qty 250

## 2014-09-09 MED ORDER — ONDANSETRON HCL 4 MG/2ML IJ SOLN: 4.0000 mg | Freq: Four times a day (QID) | INTRAMUSCULAR | Status: DC | PRN

## 2014-09-09 MED ORDER — ROCURONIUM BROMIDE 50 MG/5ML IV SOLN
INTRAVENOUS | Status: AC
  Filled 2014-09-09: qty 1

## 2014-09-09 MED ORDER — OXYCODONE HCL 5 MG PO TABS
5.0000 mg | ORAL_TABLET | Freq: Four times a day (QID) | ORAL | Status: DC | PRN
  Administered 2014-09-10 – 2014-09-11 (×4): 5 mg via ORAL
  Filled 2014-09-09 (×4): qty 1

## 2014-09-09 MED ORDER — PHENYLEPHRINE HCL 10 MG/ML IJ SOLN
INTRAMUSCULAR | Status: AC
  Filled 2014-09-09: qty 1

## 2014-09-09 MED ORDER — OXYCODONE HCL 5 MG/5ML PO SOLN: 5.0000 mg | Freq: Once | ORAL | Status: DC | PRN

## 2014-09-09 MED ORDER — PHENYLEPHRINE HCL 10 MG/ML IJ SOLN
10.0000 mg | INTRAMUSCULAR | Status: DC | PRN
  Administered 2014-09-09: 25 ug/min via INTRAVENOUS

## 2014-09-09 MED ORDER — HEPARIN SODIUM (PORCINE) 1000 UNIT/ML DIALYSIS: 40.0000 [IU]/kg | Freq: Once | INTRAMUSCULAR | Status: DC

## 2014-09-09 MED ORDER — LIDOCAINE-EPINEPHRINE 0.5 %-1:200000 IJ SOLN
INTRAMUSCULAR | Status: AC
  Filled 2014-09-09: qty 1

## 2014-09-09 MED ORDER — 0.9 % SODIUM CHLORIDE (POUR BTL) OPTIME
TOPICAL | Status: DC | PRN
  Administered 2014-09-09: 1000 mL

## 2014-09-09 MED ORDER — PENTAFLUOROPROP-TETRAFLUOROETH EX AERO
1.0000 | INHALATION_SPRAY | CUTANEOUS | Status: DC | PRN
Start: 2014-09-09 — End: 2014-09-10

## 2014-09-09 MED ORDER — NEPRO/CARBSTEADY PO LIQD: 237.0000 mL | ORAL | Status: DC | PRN

## 2014-09-09 MED ORDER — FENTANYL CITRATE (PF) 100 MCG/2ML IJ SOLN
INTRAMUSCULAR | Status: DC | PRN
  Administered 2014-09-09: 25 ug via INTRAVENOUS
  Administered 2014-09-09: 50 ug via INTRAVENOUS
  Administered 2014-09-09: 25 ug via INTRAVENOUS

## 2014-09-09 MED ORDER — ALTEPLASE 2 MG IJ SOLR: 2.0000 mg | Freq: Once | INTRAMUSCULAR | Status: AC | PRN

## 2014-09-09 MED ORDER — SODIUM CHLORIDE 0.9 % IV SOLN
100.0000 mL | INTRAVENOUS | Status: DC | PRN
Start: 2014-09-09 — End: 2014-09-10

## 2014-09-09 MED ORDER — LEVALBUTEROL HCL 0.63 MG/3ML IN NEBU
0.6300 mg | INHALATION_SOLUTION | Freq: Four times a day (QID) | RESPIRATORY_TRACT | Status: DC
  Administered 2014-09-09: 0.63 mg via RESPIRATORY_TRACT
  Filled 2014-09-09: qty 3

## 2014-09-09 MED ORDER — SODIUM CHLORIDE 0.9 % IV SOLN
100.0000 mL | INTRAVENOUS | Status: DC | PRN
  Administered 2014-09-09 (×2): 100 mL via INTRAVENOUS

## 2014-09-09 MED ORDER — LIDOCAINE-PRILOCAINE 2.5-2.5 % EX CREA: 1.0000 "application " | TOPICAL_CREAM | CUTANEOUS | Status: DC | PRN

## 2014-09-09 MED ORDER — ONDANSETRON HCL 4 MG/2ML IJ SOLN
INTRAMUSCULAR | Status: AC
  Filled 2014-09-09: qty 2

## 2014-09-09 SURGICAL SUPPLY — 35 items
APL SKNCLS STERI-STRIP NONHPOA (GAUZE/BANDAGES/DRESSINGS) ×1
BENZOIN TINCTURE PRP APPL 2/3 (GAUZE/BANDAGES/DRESSINGS) ×3 IMPLANT
BLADE SURG 10 STRL SS (BLADE) ×2 IMPLANT
CANISTER SUCTION 2500CC (MISCELLANEOUS) ×3 IMPLANT
CANNULA VESSEL 3MM 2 BLNT TIP (CANNULA) ×2 IMPLANT
CLIP LIGATING EXTRA MED SLVR (CLIP) ×3 IMPLANT
CLIP LIGATING EXTRA SM BLUE (MISCELLANEOUS) ×3 IMPLANT
CLOSURE STERI-STRIP 1/2X4 (GAUZE/BANDAGES/DRESSINGS) ×1
CLOSURE WOUND 1/2 X4 (GAUZE/BANDAGES/DRESSINGS) ×1
CLSR STERI-STRIP ANTIMIC 1/2X4 (GAUZE/BANDAGES/DRESSINGS) ×1 IMPLANT
COVER PROBE W GEL 5X96 (DRAPES) ×3 IMPLANT
DECANTER SPIKE VIAL GLASS SM (MISCELLANEOUS) ×3 IMPLANT
ELECT REM PT RETURN 9FT ADLT (ELECTROSURGICAL) ×3
ELECTRODE REM PT RTRN 9FT ADLT (ELECTROSURGICAL) ×1 IMPLANT
GAUZE SPONGE 4X4 12PLY STRL (GAUZE/BANDAGES/DRESSINGS) ×3 IMPLANT
GEL ULTRASOUND 20GR AQUASONIC (MISCELLANEOUS) IMPLANT
GLOVE BIO SURGEONS STER SZ 5.5 (GLOVE) ×4 IMPLANT
GLOVE SS BIOGEL STRL SZ 7.5 (GLOVE) ×1 IMPLANT
GLOVE SUPERSENSE BIOGEL SZ 7.5 (GLOVE) ×2
GOWN STRL REUS W/ TWL LRG LVL3 (GOWN DISPOSABLE) ×3 IMPLANT
GOWN STRL REUS W/TWL LRG LVL3 (GOWN DISPOSABLE) ×9
KIT BASIN OR (CUSTOM PROCEDURE TRAY) ×3 IMPLANT
KIT ROOM TURNOVER OR (KITS) ×3 IMPLANT
NS IRRIG 1000ML POUR BTL (IV SOLUTION) ×3 IMPLANT
PACK CV ACCESS (CUSTOM PROCEDURE TRAY) ×3 IMPLANT
PAD ARMBOARD 7.5X6 YLW CONV (MISCELLANEOUS) ×6 IMPLANT
SPONGE GAUZE 4X4 12PLY STER LF (GAUZE/BANDAGES/DRESSINGS) ×2 IMPLANT
STRIP CLOSURE SKIN 1/2X4 (GAUZE/BANDAGES/DRESSINGS) ×2 IMPLANT
SUT PROLENE 5 0 C 1 24 (SUTURE) ×2 IMPLANT
SUT PROLENE 6 0 CC (SUTURE) ×3 IMPLANT
SUT VIC AB 3-0 SH 27 (SUTURE) ×3
SUT VIC AB 3-0 SH 27X BRD (SUTURE) ×1 IMPLANT
TAPE CLOTH SURG 4X10 WHT LF (GAUZE/BANDAGES/DRESSINGS) ×2 IMPLANT
UNDERPAD 30X30 INCONTINENT (UNDERPADS AND DIAPERS) ×3 IMPLANT
WATER STERILE IRR 1000ML POUR (IV SOLUTION) ×3 IMPLANT

## 2014-09-09 NOTE — Op Note (Signed)
    OPERATIVE REPORT  DATE OF SURGERY: 09/09/2014  PATIENT: [name], 59 y.o. 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  PHYSICIAN ASSISTANT: Samantha Rhyne PA-C  ANESTHESIA:  Local with sedation  EBL: 10 cc ml  Total I/O In: 300 [I.V.:300] Out: 10 [Blood:10]  BLOOD ADMINISTERED: None  DRAINS: None  SPECIMEN: None  COUNTS CORRECT:  YES  PLAN OF CARE: PACU   PATIENT DISPOSITION:  PACU - hemodynamically stable  PROCEDURE DETAILS: Patient was taken to the operative placed supine position where the area the left arm prepped draped in usual sterile fashion. Incision was made over the venous aneurysm which was near the antecubital space. The vein was isolated proximal to the venous aneurysm near the arteriovenous anastomosis. The vein was also isolated distal to the aneurysm. Preoperative shuntogram had shown that there was a large branch arising just distal to this. The large competing branch was isolated and was ligated with a 2-0 silk tie. The vein was occluded with a vascular clamp proximal and distal to the venous aneurysm. The aneurysm was excised. There was enough length to create an end and closure. The edges of the vein were divided back to normal vein. The vein was sewn into into itself with a running 60 proline suture. Prior to completion of the closure the usual flushing maneuvers were undertaken. Anastomosis was completed and clamps removed with an excellent thrill. The wound irrigated with saline. Hemostasis tablet cautery. The wounds were closed with 30 Vicryls in the subcutaneous and subcuticular tissue. Benzoin Steri-Strips were applied.   Gretta Beganodd Neithan Day, M.D. 09/09/2014 3:33  PM

## 2014-09-09 NOTE — Transfer of Care (Signed)
Immediate Anesthesia Transfer of Care Note  Patient: [name]  Procedure(s) Performed: Procedure(s): LIGATION OF COMPETING BRANCH, & RESECTION OF VENOUS ANEURYSM OF LEFT UPPER ARM ARTERIOVENOUS FISTULA (Left)  Patient Location: PACU  Anesthesia Type:MAC  Level of Consciousness: awake, alert  and oriented  Airway & Oxygen Therapy: Patient Spontanous Breathing and Patient connected to nasal cannula oxygen  Post-op Assessment: Report given to RN and Post -op Vital signs reviewed and stable  Post vital signs: Reviewed and stable  Last Vitals:  Filed Vitals:   09/09/14 1556  BP:   Pulse:   Temp: 36.6 C  Resp:     Complications: No apparent anesthesia complications

## 2014-09-09 NOTE — Progress Notes (Signed)
Subjective: Interval History: has complaints realizes anxiety is issue at dialysis.  Objective: Vital signs in last 24 hours: Temp:  [97.4 F (36.3 C)-97.8 F (36.6 C)] 97.4 F (36.3 C) (06/08 0945) Pulse Rate:  [93-132] 107 (06/08 0945) Resp:  [18-36] 18 (06/08 0945) BP: (79-116)/(33-67) 101/65 mmHg (06/08 0945) SpO2:  [93 %-99 %] 96 % (06/08 0945) Weight:  [96.7 kg (213 lb 3 oz)] 96.7 kg (213 lb 3 oz) (06/08 0500) Weight change:   Intake/Output from previous day: 06/07 0701 - 06/08 0700 In: 120 [P.O.:120] Out: 2517 [Stool:2] Intake/Output this shift:    General appearance: cooperative and mildly obese Resp: diminished breath sounds bilaterally and wheezes bibasilar Chest wall: LIJ cath Cardio: S1, S2 normal and systolic murmur: holosystolic 2/6, blowing at apex GI: pos bs,liver down 5 cm, Drain RUQ, PEG mid abdm Extremities: AVF LUA  Lab Results:  Recent Labs  09/08/14 0500 09/09/14 0513  WBC 9.7 9.2  HGB 9.2* 9.3*  HCT 28.7* 30.1*  PLT 368 376   BMET:  Recent Labs  09/08/14 1100 09/09/14 0513  NA 129* 133*  K 4.2 3.7  CL 89* 94*  CO2 23 26  GLUCOSE 78 105*  BUN 42* 21*  CREATININE 5.32* 3.58*  CALCIUM 8.0* 8.0*    Recent Labs  09/08/14 1100  PTH 346*   Iron Studies:  Recent Labs  09/08/14 1100  IRON 37  TIBC 160*    Studies/Results: Dg Sinus/fist Tube Chk-non Gi  09/07/2014   CLINICAL DATA:  Evaluate for persistence of fistula.  EXAM: ABSCESS INJECTION  TECHNIQUE: Under direct fluoroscopic guidance water-soluble contrast material was injected through the right-sided percutaneous drainage catheter.  CONTRAST:  12mL OMNIPAQUE IOHEXOL 300 MG/ML  SOLN  FLUOROSCOPY TIME:  Radiation Exposure Index (as provided by the fluoroscopic device):  If the device does not provide the exposure index:  Fluoroscopy Time (in minutes and seconds):  48 seconds  Number of Acquired Images:  6  COMPARISON:  07/27/2014  FINDINGS: Since the previous exam the right-sided  percutaneous drainage catheter appears to have been pulled back. Injection of water-soluble contrast material through the catheter was performed. No extravasation of contrast material into the peritoneal cavity identified. The contrast material preferentially flowed in a retrograde fashion around the catheter to the skin surface. A thin, short sinus tract was noted extending from the tip of the catheter. No opacification of duodenum noted.  IMPRESSION: 1. No evidence for free extravasation of contrast material in the intraperitoneal cavity in no evidence for communication with the duodenum. 2. The ingested contrast material preferentially flowed in a retrograde fashion towards the skin surface.   Electronically Signed   By: Signa Kellaylor  Stroud M.D.   On: 09/07/2014 15:05    I have reviewed the patient's current medications.  Assessment/Plan: 1 ESRD  HD TTS.  Needs way other than meds to deal with anxiety 2 HPTH start vit D 3 Access for fistula revision 4 enteric fistula  Per surgery , per GU/peritonits still on AB 5 nutrition TF and po 6 Anxiety major issue 7 Anemia give Fe,cont epo 8 REnal mass 9 Abdm abscess P HD, AB, revise fistula,  Surgery eval of fistula   LOS: 84 days   Yulissa Needham L 09/09/2014,9:56 AM

## 2014-09-09 NOTE — Anesthesia Preprocedure Evaluation (Signed)
Anesthesia Evaluation  Patient identified by MRN, date of birth, ID band Patient awake    Reviewed: Allergy & Precautions, NPO status , Patient's Chart, lab work & pertinent test results  Airway Mallampati: II   Neck ROM: full    Dental   Pulmonary shortness of breath, asthma , COPDCurrent Smoker,  breath sounds clear to auscultation        Cardiovascular hypertension, + Peripheral Vascular Disease Rhythm:regular Rate:Normal     Neuro/Psych Depression CVA    GI/Hepatic PUD,   Endo/Other    Renal/GU ESRFRenal disease     Musculoskeletal  (+) Arthritis -,   Abdominal   Peds  Hematology   Anesthesia Other Findings   Reproductive/Obstetrics                             Anesthesia Physical Anesthesia Plan  ASA: III  Anesthesia Plan: MAC   Post-op Pain Management:    Induction: Intravenous  Airway Management Planned: Simple Face Mask  Additional Equipment:   Intra-op Plan:   Post-operative Plan:   Informed Consent: I have reviewed the patients History and Physical, chart, labs and discussed the procedure including the risks, benefits and alternatives for the proposed anesthesia with the patient or authorized representative who has indicated his/her understanding and acceptance.     Plan Discussed with: CRNA, Anesthesiologist and Surgeon  Anesthesia Plan Comments:         Anesthesia Quick Evaluation

## 2014-09-09 NOTE — Progress Notes (Signed)
PT Cancellation Note  Patient Details Name: [name] MRN: [id number] DOB: 1955-11-12   Cancelled Treatment:    Reason Eval/Treat Not Completed: Patient at procedure or test/unavailable Currently in surgery  [name], SPT Acute Rehab Services [phone]   [name] 09/09/2014, 1:34 PM

## 2014-09-09 NOTE — Anesthesia Procedure Notes (Signed)
Procedure Name: MAC Date/Time: 09/09/2014 2:45 PM Performed by: Leonel Ramsay'LAUGHLIN, Fredis Malkiewicz H Pre-anesthesia Checklist: Patient identified, Emergency Drugs available, Suction available, Patient being monitored and Timeout performed Patient Re-evaluated:Patient Re-evaluated prior to inductionOxygen Delivery Method: Simple face mask Dental Injury: Teeth and Oropharynx as per pre-operative assessment

## 2014-09-09 NOTE — Progress Notes (Signed)
Day of Surgery  Subjective: Saw pt in holding area. No significant complaints. To go to OR with vascular today  Objective: Vital signs in last 24 hours: Temp:  [97.4 F (36.3 C)-97.8 F (36.6 C)] 97.4 F (36.3 C) (06/08 0945) Pulse Rate:  [93-132] 107 (06/08 0945) Resp:  [18-36] 18 (06/08 0945) BP: (79-116)/(33-67) 101/65 mmHg (06/08 0945) SpO2:  [93 %-99 %] 96 % (06/08 0945) Weight:  [96.7 kg (213 lb 3 oz)] 96.7 kg (213 lb 3 oz) (06/08 0500) Last BM Date: 09/07/14  Intake/Output from previous day: 06/07 0701 - 06/08 0700 In: 120 [P.O.:120] Out: 2517 [Stool:2] Intake/Output this shift:    Alert, not ill appearing Soft, nt, obese. Midline wound - open, stable, no necrotic tissue. Feeding intact. Red rubber in RUQ intact but appears to have pulled back some  Lab Results:   Recent Labs  09/08/14 0500 09/09/14 0513  WBC 9.7 9.2  HGB 9.2* 9.3*  HCT 28.7* 30.1*  PLT 368 376   BMET  Recent Labs  09/08/14 1100 09/09/14 0513  NA 129* 133*  K 4.2 3.7  CL 89* 94*  CO2 23 26  GLUCOSE 78 105*  BUN 42* 21*  CREATININE 5.32* 3.58*  CALCIUM 8.0* 8.0*   PT/INR No results for input(s): LABPROT, INR in the last 72 hours. ABG No results for input(s): PHART, HCO3 in the last 72 hours.  Invalid input(s): PCO2, PO2  Studies/Results: Dg Sinus/fist Tube Chk-non Gi  09/07/2014   CLINICAL DATA:  Evaluate for persistence of fistula.  EXAM: ABSCESS INJECTION  TECHNIQUE: Under direct fluoroscopic guidance water-soluble contrast material was injected through the right-sided percutaneous drainage catheter.  CONTRAST:  12mL OMNIPAQUE IOHEXOL 300 MG/ML  SOLN  FLUOROSCOPY TIME:  Radiation Exposure Index (as provided by the fluoroscopic device):  If the device does not provide the exposure index:  Fluoroscopy Time (in minutes and seconds):  48 seconds  Number of Acquired Images:  6  COMPARISON:  07/27/2014  FINDINGS: Since the previous exam the right-sided percutaneous drainage catheter  appears to have been pulled back. Injection of water-soluble contrast material through the catheter was performed. No extravasation of contrast material into the peritoneal cavity identified. The contrast material preferentially flowed in a retrograde fashion around the catheter to the skin surface. A thin, short sinus tract was noted extending from the tip of the catheter. No opacification of duodenum noted.  IMPRESSION: 1. No evidence for free extravasation of contrast material in the intraperitoneal cavity in no evidence for communication with the duodenum. 2. The ingested contrast material preferentially flowed in a retrograde fashion towards the skin surface.   Electronically Signed   By: Signa Kell M.D.   On: 09/07/2014 15:05    Anti-infectives: Anti-infectives    Start     Dose/Rate Route Frequency Ordered Stop   09/09/14 0815  cefUROXime (ZINACEF) 1.5 g in dextrose 5 % 50 mL IVPB     1.5 g 100 mL/hr over 30 Minutes Intravenous To Surgery 09/08/14 0806 09/10/14 0815   09/09/14 0600  cefUROXime (ZINACEF) 1.5 g in dextrose 5 % 50 mL IVPB  Status:  Discontinued     1.5 g 100 mL/hr over 30 Minutes Intravenous On call to O.R. 09/08/14 9811 09/08/14 0817   09/09/14 0400  metroNIDAZOLE (FLAGYL) tablet 500 mg     500 mg Oral 3 times per day 09/08/14 2304     08/27/14 1900  cefTAZidime (FORTAZ) 2 g in dextrose 5 % 50 mL IVPB  Status:  Discontinued     2 g 100 mL/hr over 30 Minutes Intravenous Every T-Th-Sa (Hemodialysis) 08/27/14 1846 09/08/14 0713   08/27/14 1900  metroNIDAZOLE (FLAGYL) IVPB 500 mg  Status:  Discontinued     500 mg 100 mL/hr over 60 Minutes Intravenous Every 8 hours 08/27/14 1847 09/08/14 2304   08/21/14 1430  cefTAZidime (FORTAZ) 1 g in dextrose 5 % 50 mL IVPB     1 g 100 mL/hr over 30 Minutes Intravenous  Once 08/21/14 1426 08/21/14 1547   08/18/14 1300  metroNIDAZOLE (FLAGYL) IVPB 500 mg  Status:  Discontinued     500 mg 100 mL/hr over 60 Minutes Intravenous 3 times  per day 08/18/14 1045 08/27/14 1847   08/18/14 1200  cefTAZidime (FORTAZ) 2 g in dextrose 5 % 50 mL IVPB  Status:  Discontinued     2 g 100 mL/hr over 30 Minutes Intravenous Every T-Th-Sa (Hemodialysis) 08/18/14 1054 08/27/14 1846   08/12/14 1449  ceFAZolin (ANCEF) 2-3 GM-% IVPB SOLR    Comments:  Troy SineLaffan, Eileen   : cabinet override      08/12/14 1449 08/12/14 1629   08/12/14 1130  ceFAZolin (ANCEF) IVPB 2 g/50 mL premix    Comments:  Hang ON CALL to xray 5/11   2 g 100 mL/hr over 30 Minutes Intravenous To Radiology 08/12/14 1035 08/12/14 1520   08/04/14 1200  vancomycin (VANCOCIN) 50 mg/mL oral solution 125 mg  Status:  Discontinued     125 mg Oral 4 times per day 08/04/14 0851 08/08/14 1229   07/18/14 1800  vancomycin (VANCOCIN) 50 mg/mL oral solution 125 mg  Status:  Discontinued     125 mg Oral 4 times per day 07/18/14 1455 08/04/14 0845   07/17/14 1830  vancomycin (VANCOCIN) IVPB 750 mg/150 ml premix     750 mg 150 mL/hr over 60 Minutes Intravenous  Once 07/17/14 1818 07/18/14 0024   07/15/14 1400  metroNIDAZOLE (FLAGYL) tablet 500 mg  Status:  Discontinued     500 mg Oral 3 times per day 07/15/14 1202 07/18/14 1533   07/13/14 2200  piperacillin-tazobactam (ZOSYN) IVPB 2.25 g  Status:  Discontinued     2.25 g 100 mL/hr over 30 Minutes Intravenous 3 times per day 07/13/14 1318 07/24/14 1138   07/13/14 2000  fluconazole (DIFLUCAN) IVPB 200 mg  Status:  Discontinued     200 mg 100 mL/hr over 60 Minutes Intravenous Every 24 hours 07/13/14 1318 07/19/14 1027   07/07/14 1200  vancomycin (VANCOCIN) IVPB 1000 mg/200 mL premix  Status:  Discontinued     1,000 mg 200 mL/hr over 60 Minutes Intravenous Every 24 hours 07/07/14 0937 07/14/14 1712   07/06/14 2000  fluconazole (DIFLUCAN) IVPB 400 mg  Status:  Discontinued     400 mg 100 mL/hr over 120 Minutes Intravenous Every 24 hours 07/06/14 1507 07/13/14 1318   07/06/14 1800  piperacillin-tazobactam (ZOSYN) IVPB 2.25 g  Status:  Discontinued      2.25 g 100 mL/hr over 30 Minutes Intravenous 4 times per day 07/06/14 1505 07/13/14 1318   07/05/14 2000  fluconazole (DIFLUCAN) IVPB 200 mg  Status:  Discontinued     200 mg 100 mL/hr over 60 Minutes Intravenous Every 24 hours 07/05/14 0757 07/06/14 1507   07/05/14 1400  piperacillin-tazobactam (ZOSYN) IVPB 2.25 g  Status:  Discontinued     2.25 g 100 mL/hr over 30 Minutes Intravenous 3 times per day 07/05/14 0749 07/06/14 1505   07/02/14 1800  vancomycin (VANCOCIN) IVPB  1000 mg/200 mL premix  Status:  Discontinued     1,000 mg 200 mL/hr over 60 Minutes Intravenous Every 24 hours 07/01/14 1858 07/05/14 0801   07/01/14 2200  piperacillin-tazobactam (ZOSYN) IVPB 3.375 g  Status:  Discontinued     3.375 g 100 mL/hr over 30 Minutes Intravenous 4 times per day 07/01/14 1858 07/05/14 0749   07/01/14 2000  fluconazole (DIFLUCAN) IVPB 400 mg  Status:  Discontinued     400 mg 100 mL/hr over 120 Minutes Intravenous Every 24 hours 07/01/14 1858 07/05/14 0757   07/01/14 1400  fluconazole (DIFLUCAN) IVPB 200 mg  Status:  Discontinued     200 mg 100 mL/hr over 60 Minutes Intravenous Every 24 hours 07/01/14 1330 07/01/14 1853   07/01/14 1000  piperacillin-tazobactam (ZOSYN) IVPB 2.25 g  Status:  Discontinued     2.25 g 100 mL/hr over 30 Minutes Intravenous Every 8 hours 07/01/14 0952 07/01/14 1853   07/01/14 1000  vancomycin (VANCOCIN) 500 mg in sodium chloride 0.9 % 100 mL IVPB     500 mg 100 mL/hr over 60 Minutes Intravenous  Once 07/01/14 0952 07/01/14 1126   06/30/14 1200  vancomycin (VANCOCIN) IVPB 1000 mg/200 mL premix     1,000 mg 200 mL/hr over 60 Minutes Intravenous  Once 06/30/14 0944 06/30/14 1403   06/30/14 1200  ceFEPIme (MAXIPIME) 2 g in dextrose 5 % 50 mL IVPB     2 g 100 mL/hr over 30 Minutes Intravenous  Once 06/30/14 0944 06/30/14 1425   06/30/14 0100  vancomycin (VANCOCIN) 2,000 mg in sodium chloride 0.9 % 500 mL IVPB     2,000 mg 250 mL/hr over 120 Minutes Intravenous   Once 06/30/14 0048 06/30/14 0525   06/30/14 0100  ceFEPIme (MAXIPIME) 2 g in dextrose 5 % 50 mL IVPB     2 g 100 mL/hr over 30 Minutes Intravenous  Once 06/30/14 0048 06/30/14 0322   06/27/14 0600  cefUROXime (ZINACEF) 1.5 g in dextrose 5 % 50 mL IVPB     1.5 g 100 mL/hr over 30 Minutes Intravenous On call to O.R. 06/26/14 1019 06/27/14 0630   06/19/14 1800  oseltamivir (TAMIFLU) capsule 30 mg     30 mg Oral Every M-W-F (1800) 06/19/14 1003 06/22/14 1841   06/18/14 1600  oseltamivir (TAMIFLU) capsule 30 mg  Status:  Discontinued     30 mg Oral Daily 06/18/14 1538 06/19/14 1003      Assessment/Plan: s/p Procedure(s): REVISON OF LEFT UPPER ARM ARTERIOVENOUS FISTULA, LIGATION OF SIDE BRANCHES (Left)  Drain study (red rubber) showed no communication to fistula.  Can resume previous diet after OR with vascular  Will plan on repeating CT a/p in next day or 2 to evaluate RUQ. If no abscess will pull red rubber tube  Mary Sella. Andrey Campanile, MD, FACS General, Bariatric, & Minimally Invasive Surgery [facility], [place]   LOS: 84 days    Atilano Ina 09/09/2014

## 2014-09-09 NOTE — Discharge Instructions (Signed)
° ° °  09/09/2014 [name] [id number] 31-Aug-1955  Surgeon(s): Larina Earthlyodd F Early, MD  Procedure(s): LIGATION OF COMPETING BRANCH, & RESECTION OF VENOUS ANEURYSM OF LEFT UPPER ARM ARTERIOVENOUS FISTULA  x Do not stick fistulafor 4 weeks

## 2014-09-09 NOTE — Interval H&P Note (Signed)
History and Physical Interval Note:  09/09/2014 6:34 AM  [name]  has presented today for surgery, with the diagnosis of Mechanical complication of arteriovenous fistula T82.240S   The various methods of treatment have been discussed with the patient and family. After consideration of risks, benefits and other options for treatment, the patient has consented to  Procedure(s): REVISON OF LEFT UPPER ARM ARTERIOVENOUS FISTULA, LIGATION OF SIDE BRANCHES (Left) as a surgical intervention .  The patient's history has been reviewed, patient examined, no change in status, stable for surgery.  I have reviewed the patient's chart and labs.  Questions were answered to the patient's satisfaction.     [name], [name]

## 2014-09-09 NOTE — Progress Notes (Signed)
[name], female   DOB: 04-25-1955, 59 y.o.   MRN: [id number] The patient is for revision of her AV fistula today. Case was scheduled for 8:30. Unfortunately the patient had her tube feeds running all night. These have been stopped and she will be brought back to the operating room allowing 6 hours of no tube feeds.

## 2014-09-09 NOTE — Progress Notes (Signed)
PROGRESS NOTE  [name] FMB:[id number] DOB: Apr 11, 1955 DOA: 06/17/2014 PCP: [name], MD  Brief Narrative: 59 yo ? smoker with hx HTN, COPD, CKD IV followed @ [facility]-[name] Page, failed L AV fistula, initially admitted 3/16 with flu and acute hypoxic resp failure. Ultimately tx to [facility] due to need for HD. L sided weakness 3/18 and found to have R ACA stroke-showed acute infarct R ACA (3/19). Patient more lethargic 3/30 and CT abd=perf viscus 3/30. She was taken to the OR for ex lap for perforated pyloric ulcer with diffuse peritonitis, ischemic necrosis of mid ileum. Patient developed septic shock on 4-1, she was started on pressors and wean off pressors on 4-3. Subsequently, she was diagnosed with pelvic abscess by CT scan perform on 4-7. She underwent pelvic drain placement by IR on 4/8= culture neg 08/15/14 another drained placed in the pelvic cul-de-sac abscess with culture = pseudomonas. On 08/20/14, Gluteal drain was inadvertently pulled out. Repeat CT scan shows improvement in the fluid collection so decision was to leave it out and continue IV abx. Pt hospital stay also complicated by high output duodenal (enterocutaneous fistula) fistula. As a result, pt was made npo and started on TPN. Now stable, on TNA +high output from fistula/leak per [facility]. Had a G_J tube placed by Dr. [name] of IR and Gen surgery has continued to assist with management of her multiple surgical comorbidities C. diff diagnosed 4-12-initially unresponsive to flagyl, and she was started on PO vancomycin 4-16 which she finished 3 week.  5/27--TPN weaned off and increasing TF.  06/24/14 Develops run of SVT, atrial tachycardia. Cardiology was helping with management. Course complicated with high RV pressure and Cor Pulmonale; 4/27 CT angio negative for PE. On 5/2 she developed hypoxemia, hypotension and SVT during dialysis treatment. Dialysis was stopped. She received IV bolus. She was started on BIPAP.  She was transferred to Step down unit. PCCM and cardiology re consulted. Her Hr decreased, BP improved. She was taken off BIPAP. This recurred on 5/15 with episodic SVT. Since then she has had intermitten short episodes of SVT, but on 08/28/14 she has had more sustained SVT, therefore; cardiology was reconsulted on on 08/29/14.  Assessment/Plan: Status post exploratory laparotomy with closure of perforated pyloric ulcer/abdominal abscess / small bowel resection w/ postop ileus and now with enterocutaneous fistula. Pelvic abscess s/p percutaneous drain placement  -completed, long courses of IV  Abx including Vanc and FLuconazole -Due to Fistula, she was made NPO, TPN was started -Fistula study done shows JP drain connection to the duodenal bulb-5-02. - 08/15/2014 repeat CT abdomen with 3.0 x 3.5 x 5.7 cm collection in the pelvic cul-de-sac and extraluminal air alongside JP drain in anterior abd wall -had high output from Fistula/leak. Had G_J placed by IR 08/12/14 at request of gen surgery -CT repeat shows 5 cm post cul-de-sac abscess which was drained on 5/16-culture revealed pseudomonas aeruginosa -ID was consulted as patient had Pseudomonas growing from her culture wounds and they started back Flagyl as well as Elita Quick on 5/16  -Per ID recommendation--plan D#21/21 days of Elita Quick and D#21/ 28 days metronidazole, stop [place] after todays dose -Patient inadvertently pulled out her gluteal/cul-de-sac drain May 19. Seen by interventional radiology and they recommended repeat CT scan, which was done. Shows that the fluid collection has decreased in size. They do not plan to replace the drain at this time. Continue with antibiotics alone.  -08/21/2014 CT scan did suggest  a transmural defect in the stomach wall near the patient's pyloric Cheree DittoGraham patch. -Patient accidentally pulled out her JP drain on 5/22. This has been replaced by surgery on 5/23. -Octreotide was discontinued 5/23--enterocutaneous fistula  output appears to be decreasing gradually -finished TPN on 08/27/14 -Enteral Feeding was increased 5/27 to 55 mL per hour--tolerating without residuals -08/29/14--started clear liquids-->tolerating well -08/31/14--pt inadvertently pulled out R-side JP drain again (3rd time) during therapy + GJ tube with bloody drainage--contacted general surgery 09/01/14--IR replaced RUQ drain (entercutaneous fistula)--reminded pt to wear abd binder at all times; no output from this drain 6/1: started full liquids  6/3: started regular diet: ATE a [employer]urger!!-Huge milestone as far as patient is concerned, consider stop J tube feeding if able to take oral consistently, to be determined by [facility] DC to CIR when stable per [facility], per [facility] , will repeat  CT of abd in 1-2 days,  PSVT --Atrial tachycardia -Felt to be secondary to fluid metabolic shifts from ESRD and ongoing abd issues; also has severe anxiety with dialysis as well as COPD/Cor Pulmonale  -was on IV metoprolol and amiodarone, now 5/31 switched to PO  -CTA 4/27, negative for PE  -Cards following peripherally  ESRD new, now on HD this admit complicated by intermittent hypotension AKI on CKD 4, due to sepsis, shock Ongoing hemodialysis per Nephrology - perm-cath placed 3/26 d/t AVF problems Patient AV fistula reviewed and not mature yet per Dr. Paulene FloorFields-will need to revisit prior to d/c -TPN weaned off 08/27/14 -Continue hemodialysis Tuesday, Thursday, Saturday while the patient is in the hospital -Xanax with each HD due to significant anxiety with HD -starting to sit up with HD now, BPs low will not be able to tolerate much volume removal  C. difficile colitis still on contact precautions -C diff positive on 4-12, Received 4 days of flagyl without improvement, then started on PO vanc -Completed 3 weeks of Oral Vanc-stopped 5/7, this was 1 week after IV Zosyn was stopped 5/5 -5/28--having loose stools again-->Cdiff PCR negative -keep on contact  precautions, now on Po flagyl till 6/13  ACA CVA with Lt sided weakness -06/23/14--TEE ;normal LV function; no LAA thrombus -Started on ASA 81mg  per Neuro; resumed this -Will need ongoing long term rehab - PT/OT to cont to follow while inpatient -Patient remains very weak and deconditioned. 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 Fluids/volume managed with hemodialysis.  Elevated PA pressures on 2-D echo; CTA negative for PE - 07/23/2014 echo-- EF 60-61%, grade 1 diastolic dysfunction, Severe RV dilatation, PAP 90  Small PFO Has been evaluated by Cardiology. No intervention planned currently.  Anemia of critical illness/chronic disease -and bleeding from leak/fistula which has resolved. -s/p 2U PRBC 4/13 and 5/10 and 5/26 -Aranesp/iron per renal -transfuse for hemoglobin less than 7  Acute hypoxic Respiratory Failure:  -This was secondary to H1N1, hypervolemia, HCAP >> resolved.  -also has COPD and Cor Pulmonale - now on scheduled Xopenex and Atrovent, change to PRN  - presently stable on 2 L-3L  Thrombocytopenia; resolved.  Appears to be associated with sepsis Has had mild positive HIT test during this admission   Septic shock  -due to perforated prepyloric ulcer, required pressors -resolved currently  Diabetes mellitus 2 Did have episodes of Hypoglycemia in setting of NPO status  -Hba1c 6.5 -CBGs stable now  Abnormal TSH -Free T4 WNL, T3 1.8 -most likely sick thyroid with ongoing infection  and body stress; also due to amiodarone use. TSH in 7 range -continue low dose synthroid given PSVT, changed to PO synthroid  L Renal mass  -noted on [place], Indeterminate 1.9 cm hypoechoic mass off the interpolar region of the left kidney -may potentially represent a cyst however solid mass is not excluded -needs outpatient FU for this  Fall on May 18  DVT  prophylaxis: SCDs Code Status: FULL Family Communication: Discussed with patient. No family at bedside. Disposition Plan: Tx to tele,  Not accepted to LTAC, CIR when stable per [facility]  Consultants: Cardiology Nephrology PCCM Gen Surgery  Palliative care    Procedures/Studies: Ct Abdomen Pelvis Wo Contrast  08/22/2014   CLINICAL DATA:  Abdominal abscess. No intravenous contrast due to renal failure.  EXAM: CT ABDOMEN AND PELVIS WITHOUT CONTRAST  TECHNIQUE: Multidetector CT imaging of the abdomen and pelvis was performed following the standard protocol without IV contrast.  COMPARISON:  08/15/2014  FINDINGS: BODY WALL: Open laparotomy without fluid collection.  LOWER CHEST: Chronic triangular opacity in the posterior costophrenic sulcus on the left, atelectasis versus scarring  ABDOMEN/PELVIS:  Liver: No focal abnormality.  Biliary: High-density material within the gallbladder showing mild wall thickening which is chronic.  Pancreas: Unremarkable.  Spleen: Unremarkable.  Adrenals: Unremarkable.  Kidneys and ureters: No hydronephrosis or stone. Presumed 1 cm cyst from the interpolar left kidney  Bladder: Decompressed.  No pathologic findings.  Reproductive: Calcified and noncalcified uterine fibroids, better seen on previous enhanced imaging.  Bowel: Right upper quadrant drain related to perforated pyloric ulcer status post Cheree Ditto patch. Specially visible on coronal imaging, there is a gas channel through the pylorus measuring 6 to 9 mm in diameter. No bowel obstruction. Enteroenterostomy noted in the pelvis. Oral contrast again seen in the colon. No notable colonic wall thickening in this patient with history of C difficile. Negative appendix. Percutaneous gastrojejunostomy tube is in good position.  Retroperitoneum: No mass or adenopathy.  Peritoneum: Known thick walled collection in the rectovaginal recess smaller than 08/15/2014 at 40 x 16 mm as compared to 51 x 32 mm. No new collection is  identified. No free pneumoperitoneum.  Vascular: 3 cm fusiform infrarenal aortic aneurysm.  OSSEOUS: No acute abnormalities.  IMPRESSION: 1. Near the patient's pyloric Cheree Ditto patch is a transmural defect in the stomach wall, contiguous with the surgical drain. If repeat surgery is considered, oral contrast could confirm an open ulcer. 2. Decreased rectovaginal recess fluid collection, now 4 x 1.5 cm (previously 5 x 3 cm).   Electronically Signed   By: Marnee Spring M.D.   On: 08/22/2014 01:49   Ct Abdomen Pelvis Wo Contrast  08/15/2014   CLINICAL DATA:  Intra abdominal abscess.  EXAM: CT ABDOMEN AND PELVIS WITHOUT CONTRAST  TECHNIQUE: Multidetector CT imaging of the abdomen and pelvis was performed following the standard protocol without IV contrast.  COMPARISON:  CT scan of Aug 03, 2014.  FINDINGS: Severe degenerative disc disease is noted at L5-S1. Minimal subsegmental atelectasis is noted posteriorly in the left lung base.  No gallstones are noted. No focal abnormality is noted in the liver, spleen or pancreas on these unenhanced images. Adrenal glands appear normal. Stable exophytic cyst is seen arising from midpole of left kidney. No hydronephrosis or renal obstruction is noted. No renal or ureteral calculi are noted. 3 cm infrarenal abdominal aortic aneurysm is noted. Gastrojejunostomy tube is seen entering stomach percutaneously and with distal tip in the jejunum. JP surgical drain is seen entering right upper quadrant  of abdomen underneath the liver with distal tip in the left upper quadrant. No significant fluid collection is noted around the drain. Fluid collection is again identified and posterior cul-de-sac of pelvis measuring 5.1 x 3.2 cm. Calcified degenerating fibroid is noted in the uterus. Urinary bladder is unremarkable. There is no evidence of bowel obstruction. The appendix appears normal. No significant adenopathy is noted. Large midline surgical anterior abdominal wall incision is noted.   IMPRESSION: Stable 3 cm infrarenal abdominal aortic aneurysm.  No change in position of JP surgical drain with distal tip in the left upper quadrant of the abdomen.  Interval placement of percutaneous gastrojejunostomy tube with distal tip in the jejunum.  Grossly stable size and appearance of 5 cm fluid collection seen in posterior cul-de-sac of the pelvis.  Continued presence uterine fibroids.   Electronically Signed   By: Lupita Raider, M.D.   On: 08/15/2014 15:15   Dg Chest 1 View  08/13/2014   CLINICAL DATA:  Shortness of breath  EXAM: CHEST  1 VIEW  COMPARISON:  08/03/2014  FINDINGS: Moderately severe cardiac enlargement. Central line in unchanged position on the right and left side. Mild vascular congestion with no evidence of edema effusion or consolidation.  IMPRESSION: No acute findings   Electronically Signed   By: Esperanza Heir M.D.   On: 08/13/2014 11:04   Dg Chest 2 View  09/01/2014   CLINICAL DATA:  Followup respiratory failure  EXAM: CHEST  2 VIEW  COMPARISON:  08/23/2014  FINDINGS: Moderate cardiac enlargement. Left IJ catheter is identified with tip in the SVC. There is a right-sided dialysis catheter with tips in the SVC. No pleural effusion or edema. No airspace consolidation.  IMPRESSION: 1. Lungs remain clear. 2. Catheters positioned as above.   Electronically Signed   By: Signa Kell M.D.   On: 09/01/2014 09:04   Ir Gastrostomy Tube Mod Sed  08/12/2014   CLINICAL DATA:  Perforated duodenal ulcer, post g patch with persistent leak. Percutaneous gastrojejunostomy as requested for gastric decompression and small bowel enteral feeding support.  EXAM: PERC PLACEMENT GASTROSTOMY;  CONVERT G-TUBE TO G-JTUBE  FLUOROSCOPY TIME:  16 minutes 24 seconds, 1016.6 mGy  TECHNIQUE: The procedure, risks, benefits, and alternatives were explained to the patient. Questions regarding the procedure were encouraged and answered. The patient understands and consents to the procedure. As antibiotic  prophylaxis, cefazolin 2 g was ordered pre-procedure and administered intravenously within one hour of incision. . A 5 French angiographic catheter was placed as orogastric tube. The upper abdomen was prepped with Betadine, draped in usual sterile fashion, and infiltrated locally with 1% lidocaine. 1 mg glucagon was administered intravenously to facilitate gastric distention and slow peristalsis. Stomach was insufflated using air through the orogastric tube. An 62 French sheath needle was advanced percutaneously into the gastric lumen under fluoroscopy. Gas could be aspirated and a small contrast injection confirmed intraluminal spread. The sheath was exchanged over a guidewire for a 9 [place] vascular sheath, through which the snare device was advanced and used to snare a guidewire passed through the orogastric tube. This was withdrawn, and the snare attached to the 20 French pull-through gastrostomy tube, which was advanced antegrade, positioned with the internal bumper securing the anterior gastric wall to the anterior abdominal wall. Small contrast injection confirms appropriate positioning. The external bumper was applied and the catheter was flushed.  A 5 French angiographic catheter was placed coaxially through the gastrostomy tube and used to direct an angled stiff glidewire  across the pylorus, beyond the proximal duodenal perforation, and into the proximal jejunum just beyond the ligament of Treitz. Over this, a coaxial jejunostomy feeding adapter was advanced. Contrast injection confirmed patency and appropriate position of the gastric and jejunal lumens. No extravasation. The lumens were flushed with saline and capped. The patient tolerated the procedure well.  COMPLICATIONS: COMPLICATIONS none  IMPRESSION: 1. Technically successful 20 French pull-through gastrostomy placement under fluoroscopy. 2. Technically successful coaxial jejunostomy feeding adapter placement to the ligament of Treitz.    Electronically Signed   By: Corlis Leak M.D.   On: 08/12/2014 16:12   Ir Catheter Tube Change  09/01/2014   CLINICAL DATA:  History of a duodenal fistula. Surgical drain recently fell out and request for drain replacement.  EXAM: REPLACEMENT OF PERCUTANEOUS DRAIN WITH FLUOROSCOPY  Physician: Rachelle Hora. Henn, MD  FLUOROSCOPY TIME:  3 minutes, 246 mGy  MEDICATIONS AND MEDICAL HISTORY: None  ANESTHESIA/SEDATION: Moderate sedation time: None  CONTRAST:  15 mL Omnipaque-300  PROCEDURE: The procedure was explained to the patient. The risks and benefits of the procedure were discussed and the patient's questions were addressed. Informed consent was obtained from the patient. The right upper abdomen was prepped and draped in sterile fashion. Maximal barrier sterile technique was utilized including caps, mask, sterile gowns, sterile gloves, sterile drape, hand hygiene and skin antiseptic. A 5 French catheter was advanced through the old drain site. Contrast was injected as the catheter was advanced into the tissue. Contrast injection demonstrated a connection to the duodenum bulb and stomach. Catheter and wire preferentially went into the stomach via the duodenal bulb. A series of wires were used to advance a 12 French [employer] catheter into the abdomen. The tip of the catheter was cut in order to advance the catheter over a wire. The tip was placed adjacent to the duodenal fistula but not within the bowel. Catheter was sutured to the skin. Catheter was attached to gravity bag.  FINDINGS: Focal fistula connection between the drain tract and the duodenal bulb. New 12 French catheter was placed within the fistula tract but outside of the bowel.  Estimated blood loss: Minimal  COMPLICATIONS: None  IMPRESSION: Successful replacement of the percutaneous drain. Demonstration of the duodenal bulb fistula. Drain was placed adjacent to the fistula opening but outside of the bowel.   Electronically Signed   By: Richarda Overlie  M.D.   On: 09/01/2014 13:39   Ir Adele Schilder Tamsen Snider Convert Gastr-jej Per W/fl Mod Sed  08/12/2014   CLINICAL DATA:  Perforated duodenal ulcer, post g patch with persistent leak. Percutaneous gastrojejunostomy as requested for gastric decompression and small bowel enteral feeding support.  EXAM: PERC PLACEMENT GASTROSTOMY;  CONVERT G-TUBE TO G-JTUBE  FLUOROSCOPY TIME:  16 minutes 24 seconds, 1016.6 mGy  TECHNIQUE: The procedure, risks, benefits, and alternatives were explained to the patient. Questions regarding the procedure were encouraged and answered. The patient understands and consents to the procedure. As antibiotic prophylaxis, cefazolin 2 g was ordered pre-procedure and administered intravenously within one hour of incision. . A 5 French angiographic catheter was placed as orogastric tube. The upper abdomen was prepped with Betadine, draped in usual sterile fashion, and infiltrated locally with 1% lidocaine. 1 mg glucagon was administered intravenously to facilitate gastric distention and slow peristalsis. Stomach was insufflated using air through the orogastric tube. An 2 French sheath needle was advanced percutaneously into the gastric lumen under fluoroscopy. Gas could be aspirated and a small contrast injection confirmed intraluminal  spread. The sheath was exchanged over a guidewire for a 9 [place] vascular sheath, through which the snare device was advanced and used to snare a guidewire passed through the orogastric tube. This was withdrawn, and the snare attached to the 20 French pull-through gastrostomy tube, which was advanced antegrade, positioned with the internal bumper securing the anterior gastric wall to the anterior abdominal wall. Small contrast injection confirms appropriate positioning. The external bumper was applied and the catheter was flushed.  A 5 French angiographic catheter was placed coaxially through the gastrostomy tube and used to direct an angled stiff glidewire across the pylorus,  beyond the proximal duodenal perforation, and into the proximal jejunum just beyond the ligament of Treitz. Over this, a coaxial jejunostomy feeding adapter was advanced. Contrast injection confirmed patency and appropriate position of the gastric and jejunal lumens. No extravasation. The lumens were flushed with saline and capped. The patient tolerated the procedure well.  COMPLICATIONS: COMPLICATIONS none  IMPRESSION: 1. Technically successful 20 French pull-through gastrostomy placement under fluoroscopy. 2. Technically successful coaxial jejunostomy feeding adapter placement to the ligament of Treitz.   Electronically Signed   By: Corlis Leak M.D.   On: 08/12/2014 16:12   Dg Sinus/fist Tube Chk-non Gi  09/07/2014   CLINICAL DATA:  Evaluate for persistence of fistula.  EXAM: ABSCESS INJECTION  TECHNIQUE: Under direct fluoroscopic guidance water-soluble contrast material was injected through the right-sided percutaneous drainage catheter.  CONTRAST:  12mL OMNIPAQUE IOHEXOL 300 MG/ML  SOLN  FLUOROSCOPY TIME:  Radiation Exposure Index (as provided by the fluoroscopic device):  If the device does not provide the exposure index:  Fluoroscopy Time (in minutes and seconds):  48 seconds  Number of Acquired Images:  6  COMPARISON:  07/27/2014  FINDINGS: Since the previous exam the right-sided percutaneous drainage catheter appears to have been pulled back. Injection of water-soluble contrast material through the catheter was performed. No extravasation of contrast material into the peritoneal cavity identified. The contrast material preferentially flowed in a retrograde fashion around the catheter to the skin surface. A thin, short sinus tract was noted extending from the tip of the catheter. No opacification of duodenum noted.  IMPRESSION: 1. No evidence for free extravasation of contrast material in the intraperitoneal cavity in no evidence for communication with the duodenum. 2. The ingested contrast material  preferentially flowed in a retrograde fashion towards the skin surface.   Electronically Signed   By: Signa Kell M.D.   On: 09/07/2014 15:05   Dg Chest Port 1 View  08/23/2014   CLINICAL DATA:  Left chest pain  EXAM: PORTABLE CHEST - 1 VIEW  COMPARISON:  08/18/2014  FINDINGS: Patient is rotated.  Lungs are essentially clear. No focal consolidation. No pleural effusion or pneumothorax.  Cardiomegaly.  Left IJ venous catheter terminates at the cavoatrial junction. Right IJ dual lumen dialysis catheter terminates in the lower SVC.  IMPRESSION: No evidence of acute cardiopulmonary disease.   Electronically Signed   By: Charline Bills M.D.   On: 08/23/2014 19:17   Dg Chest Port 1 View  08/18/2014   CLINICAL DATA:  Shortness of breath at rest.  Having hemodialysis.  EXAM: PORTABLE CHEST - 1 VIEW  COMPARISON:  08/15/2014.  FINDINGS: The cardiac silhouette remains mildly enlarged. Stable right jugular double-lumen catheter. Clear lungs. The pulmonary vasculature remains prominent. No pleural fluid. Minimal right shoulder degenerative changes.  IMPRESSION: Stable cardiomegaly and pulmonary vascular congestion.   Electronically Signed   By: Zada Finders.D.  On: 08/18/2014 09:47   Dg Chest Port 1 View  08/15/2014   CLINICAL DATA:  Shortness of breath.  EXAM: PORTABLE CHEST - 1 VIEW  COMPARISON:  08/13/2014 and multiple prior chest radiographs  FINDINGS: Cardiomegaly and pulmonary vascular congestion again noted.  A right IJ central venous catheter is noted with tips overlying the upper and mid SVC.  A left central venous catheter is present with tip overlying the lower SVC.  There is no evidence of focal airspace disease, pulmonary edema, suspicious pulmonary nodule/mass, pleural effusion, or pneumothorax. No acute bony abnormalities are identified.  IMPRESSION: Cardiomegaly with pulmonary vascular congestion.   Electronically Signed   By: Harmon Pier M.D.   On: 08/15/2014 17:28   Dg Abd Portable  1v  08/23/2014   CLINICAL DATA:  JP drain, broken, initial encounter  EXAM: PORTABLE ABDOMEN - 1 VIEW  COMPARISON:  CT abdomen pelvis dated 08/21/2014  FINDINGS: A portion of the right flank is excluded from this single image.  The patient's right abdominal surgical drain is not visualized.  Gastrojejunostomy in satisfactory position.  IMPRESSION: The patient's right abdominal surgical drain is not visualized.  Gastrojejunostomy in satisfactory position.   Electronically Signed   By: Charline Bills M.D.   On: 08/23/2014 19:11   Ct Image Guided Fluid Drain By Catheter  08/17/2014   CLINICAL DATA:  Postoperative intra-abdominal abscess, subsequent encounter. Small pelvic fluid collection.  EXAM: CT-GUIDED DRAIN PLACEMENT IN PELVIC FLUID COLLECTION  Physician: Rachelle Hora. Lowella Dandy, MD  FLUOROSCOPY TIME:  None  MEDICATIONS: 50 mcg fentanyl  ANESTHESIA/SEDATION: Patient was monitored by a radiology nurse during the procedure.  PROCEDURE: Informed consent was obtained for drain placement. Patient was placed on her left side. Images through the pelvis were obtained. The right buttock was prepped and draped in sterile fashion. 1% lidocaine was used for a local anesthetic. An 18 gauge needle was directed into the pelvic fluid collection with CT guidance from a transgluteal approach. Thick yellow purulent fluid was aspirated. A stiff Amplatz wire was placed. The tract was dilated to accommodate a 10.2 [place] multipurpose drain. It was technically difficult to advance the drain into the collection due to the small size of the collection and the patient's body habitus. Eventually, the drain was successfully placed within the collection. 5 mL of yellow purulent fluid was obtained. Catheter was sutured to the skin and attached to a suction bulb. Fluid was sent for culture.  FINDINGS: Small fluid collection just posterior to the uterus. A total of 5 mL of yellow purulent fluid was removed.  Estimated blood loss: Minimal   COMPLICATIONS: None  IMPRESSION: CT-guided placement of a drainage catheter in the small pelvic fluid collection.   Electronically Signed   By: Richarda Overlie M.D.   On: 08/17/2014 17:21        Subjective: Returned from OR for avf revision, overall feeling better, on 3liter oxygen, on gj tube feeds  Objective: Filed Vitals:   09/09/14 1610 09/09/14 1615 09/09/14 1630 09/09/14 1647  BP: 101/69  112/70 95/63  Pulse: 84 101 89 91  Temp:   97.8 F (36.6 C) 98.2 F (36.8 C)  TempSrc:    Oral  Resp: 24 23 26 24   Height:      Weight:      SpO2: 100% 92% 98% 97%    Intake/Output Summary (Last 24 hours) at 09/09/14 2031 Last data filed at 09/09/14 1849  Gross per 24 hour  Intake    710 ml  Output     10 ml  Net    700 ml   Weight change:  Exam:   General:  Pt is alert, follows commands appropriately, not in acute distress, chronically ill appearing,   HEENT: No icterus,  [place]/AT  Cardiovascular: RRR, S1/S2, no rubs, no gallops  Respiratory: basilar ronchi, Good air movement.  Abdomen: Soft/+BS, non tender, non distended, no guarding, G tube clamped, j tube with TF, perc drain no output observed.   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, aaox3  Data Reviewed: Basic Metabolic Panel:  Recent Labs Lab 09/04/14 0430 09/05/14 0532 09/06/14 1230 09/08/14 0500 09/08/14 1100 09/09/14 0513  NA 135 135  --  130* 129* 133*  K 4.3 4.5  --  4.1 4.2 3.7  CL 99* 94*  --  90* 89* 94*  CO2 27 26  --  23 23 26   GLUCOSE 92 97  --  82 78 105*  BUN 19 33*  --  41* 42* 21*  CREATININE 2.86* 4.32*  --  5.29* 5.32* 3.58*  CALCIUM 7.7* 8.4*  --  8.1* 8.0* 8.0*  MG  --   --  1.8  --   --   --   PHOS  --   --   --   --  8.5*  --    Liver Function Tests:  Recent Labs Lab 09/08/14 1100  ALBUMIN 2.1*   No results for input(s): LIPASE, AMYLASE in the last 168 hours. No  results for input(s): AMMONIA in the last 168 hours. CBC:  Recent Labs Lab 09/03/14 0350 09/04/14 0430 09/05/14 0532 09/08/14 0500 09/09/14 0513  WBC 11.5* 9.9 11.1* 9.7 9.2  HGB 9.3* 9.0* 9.2* 9.2* 9.3*  HCT 29.7* 28.8* 29.4* 28.7* 30.1*  MCV 94.6 93.8 92.7 90.8 92.9  PLT 208 190 253 368 376   Cardiac Enzymes: No results for input(s): CKTOTAL, CKMB, CKMBINDEX, TROPONINI in the last 168 hours. BNP: Invalid input(s): POCBNP CBG: No results for input(s): GLUCAP in the last 168 hours.  Recent Results (from the past 240 hour(s))  Clostridium Difficile by PCR     Status: None   Collection Time: 08/31/14  2:35 PM  Result Value Ref Range Status   C difficile by pcr NEGATIVE NEGATIVE Final  Surgical pcr screen     Status: None   Collection Time: 09/09/14  6:45 AM  Result Value Ref Range Status   MRSA, PCR NEGATIVE NEGATIVE Final   Staphylococcus aureus NEGATIVE NEGATIVE Final    Comment:        The Xpert SA Assay (FDA approved for NASAL specimens in patients over 68 years of age), is one component of a comprehensive surveillance program.  Test performance has been validated by [facility] for patients greater than or equal to 76 year old. It is not intended to diagnose infection nor to guide or monitor treatment.      Scheduled Meds: . ALPRAZolam  0.5 mg Oral Q T,Th,Sa-HD  . amiodarone  400 mg Oral BID  . antiseptic oral rinse  7 mL Mouth Rinse q12n4p  . aspirin  81 mg Oral Daily  . budesonide (PULMICORT) nebulizer solution  0.25 mg Nebulization BID  . [START ON 09/10/2014] calcitRIOL  0.5 mcg Oral Q T,Th,Sat-1800  . chlorhexidine  15 mL Mouth Rinse BID  . darbepoetin (ARANESP) injection - DIALYSIS  200 mcg Intravenous Q Thu-HD  .  feeding supplement (RESOURCE BREEZE)  1 Container Oral TID BM  . feeding supplement (VITAL AF 1.2 CAL)  1,000 mL Per Tube Q24H  . fentaNYL      . [START ON 09/10/2014] ferric gluconate (FERRLECIT/NULECIT) IV  125 mg Intravenous Q T,Th,Sa-HD   . ipratropium  0.5 mg Nebulization TID  . levalbuterol  0.63 mg Nebulization Q6H  . levothyroxine  25 mcg Oral QAC breakfast  . metoprolol tartrate  12.5 mg Oral BID  . metroNIDAZOLE  500 mg Oral 3 times per day  . multivitamin  1 tablet Oral QHS  . sodium chloride  10-40 mL Intracatheter Q12H   Continuous Infusions: . sodium chloride 10 mL/hr at 09/09/14 France Ravens, MD PhD [facility] Pager [phone]  If 7PM-7AM, please contact night-coverage [email or web address] Password [email or web address] 09/09/2014, 8:31 PM   LOS: 84 days

## 2014-09-09 NOTE — H&P (View-Only) (Signed)
Revision AVF left arm tomorrow Consent NPO p midnight Antibiotics on call  Charles Fields, MD [facility] Office: [phone] Pager: [phone]

## 2014-09-10 ENCOUNTER — Inpatient Hospital Stay (HOSPITAL_COMMUNITY): Payer: Medicaid Other

## 2014-09-10 ENCOUNTER — Encounter (HOSPITAL_COMMUNITY): Payer: Self-pay | Admitting: Vascular Surgery

## 2014-09-10 DIAGNOSIS — E46 Unspecified protein-calorie malnutrition: Secondary | ICD-10-CM

## 2014-09-10 DIAGNOSIS — E669 Obesity, unspecified: Secondary | ICD-10-CM

## 2014-09-10 DIAGNOSIS — R5381 Other malaise: Secondary | ICD-10-CM

## 2014-09-10 LAB — PHOSPHORUS: PHOSPHORUS: 8.2 mg/dL — AB (ref 2.5–4.6)

## 2014-09-10 LAB — COMPREHENSIVE METABOLIC PANEL
ALBUMIN: 2.1 g/dL — AB (ref 3.5–5.0)
ALT: 9 U/L — ABNORMAL LOW (ref 14–54)
AST: 16 U/L (ref 15–41)
Alkaline Phosphatase: 130 U/L — ABNORMAL HIGH (ref 38–126)
Anion gap: 16 — ABNORMAL HIGH (ref 5–15)
BUN: 38 mg/dL — ABNORMAL HIGH (ref 6–20)
CO2: 23 mmol/L (ref 22–32)
Calcium: 8 mg/dL — ABNORMAL LOW (ref 8.9–10.3)
Chloride: 95 mmol/L — ABNORMAL LOW (ref 101–111)
Creatinine, Ser: 4.67 mg/dL — ABNORMAL HIGH (ref 0.44–1.00)
GFR calc Af Amer: 11 mL/min — ABNORMAL LOW (ref >60)
GFR, EST NON AFRICAN AMERICAN: 9 mL/min — AB (ref >60)
GLUCOSE: 108 mg/dL — AB (ref 65–99)
Potassium: 4 mmol/L (ref 3.5–5.1)
Sodium: 134 mmol/L — ABNORMAL LOW (ref 135–145)
Total Bilirubin: 0.8 mg/dL (ref 0.3–1.2)
Total Protein: 6.8 g/dL (ref 6.5–8.1)

## 2014-09-10 LAB — CBC
HEMATOCRIT: 27.9 % — AB (ref 36.0–46.0)
HEMOGLOBIN: 8.8 g/dL — AB (ref 12.0–15.0)
MCH: 29.2 pg (ref 26.0–34.0)
MCHC: 31.5 g/dL (ref 30.0–36.0)
MCV: 92.7 fL (ref 78.0–100.0)
PLATELETS: 378 10*3/uL (ref 150–400)
RBC: 3.01 MIL/uL — ABNORMAL LOW (ref 3.87–5.11)
RDW: 20.2 % — AB (ref 11.5–15.5)
WBC: 9.8 10*3/uL (ref 4.0–10.5)

## 2014-09-10 MED ORDER — IOHEXOL 300 MG/ML  SOLN
25.0000 mL | INTRAMUSCULAR | Status: AC
  Administered 2014-09-10 (×2): 25 mL via ORAL

## 2014-09-10 MED ORDER — ALPRAZOLAM 0.5 MG PO TABS
ORAL_TABLET | ORAL | Status: AC
  Filled 2014-09-10: qty 1

## 2014-09-10 MED ORDER — PRO-STAT SUGAR FREE PO LIQD
30.0000 mL | Freq: Three times a day (TID) | ORAL | Status: DC
  Administered 2014-09-10 – 2014-09-11 (×3): 30 mL
  Filled 2014-09-10 (×5): qty 30

## 2014-09-10 MED ORDER — DARBEPOETIN ALFA 200 MCG/0.4ML IJ SOSY
PREFILLED_SYRINGE | INTRAMUSCULAR | Status: AC
Start: 2014-09-10 — End: 2014-09-10
  Filled 2014-09-10: qty 0.4

## 2014-09-10 NOTE — Anesthesia Postprocedure Evaluation (Signed)
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

## 2014-09-10 NOTE — Progress Notes (Signed)
Nutrition Follow-up  DOCUMENTATION CODES:  Obesity unspecified  INTERVENTION:  Boost Breeze po TID, each supplement provides 250 kcal and 9 grams of protein  Nocturnal TF regimen: Continue Vital AF 1.2 formula at 70 ml/hr x 12 hours (infuse 8 PM to 8 AM) with 30 ml Prostat TID to provide 1308 kcals, 108 gm protein, 681 ml of free water   RD to continue to monitor.   NUTRITION DIAGNOSIS:  Inadequate oral intake related to  (limited appetite) as evidenced by meal completion < 50%; ongoing  GOAL:  Patient will meet greater than or equal to 90% of their needs; progressing  MONITOR:  PO intake, TF tolerance, Labs, Weight trends, I & O's  REASON FOR ASSESSMENT:   (TF follow-up)    ASSESSMENT: Pt with CKD and hypertension, presented on 3/16 with shortness of breath and generalized weakness x 2 weeks. Pt found to have metabolic acidosis from worsening uremia, H1N1 flu. Pt started on HD. 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  Patient s/p procedure 3/30: EXPLORATORY LAPAROTOMY CLOSURE OF PERFORATED PYLORIC ULCER SMALL BOWEL RESECTION  Patient s/p procedure 5/11: G-J TUBE PLACEMENT -- tip past ligament of Treitz  Pt has been tolerating her tube feedings fine with no other difficulties per RN. Meal completion has been fairly poor with 0-25%. RD to order Prostat per tube to aid in adequate protein needs. Continue to encourage adequate PO intake. RD to continue to montior.   Labs: Low sodium, chloride, ALT, GFR. High BUN, creatinine, alkaline phosphatase, phosphorous (8.2).  Height:  Ht Readings from Last 1 Encounters:  08/02/14 5\' 6"  (1.676 m)    Weight:  Wt Readings from Last 1 Encounters:  09/10/14 218 lb 3.2 oz (98.975 kg)    Ideal Body Weight:  59.1 kg  Wt Readings from Last 10 Encounters:  09/10/14 218 lb 3.2 oz (98.975 kg)  06/01/14 225 lb (102.059 kg)  01/28/14 230 lb (104.327 kg)  01/21/14  231 lb 1.6 oz (104.826 kg)  10/02/12 216 lb 8 oz (98.204 kg)    BMI:  Body mass index is 35.24 kg/(m^2).  Estimated Nutritional Needs:  Kcal:  2100-2300  Protein:  120-140 gm  Fluid:  per MD  Skin:  Wound (see comment) (Stage I pressure ulcer on sacrum, incision on abdomen and L arm), non-pitting edema  Diet Order:  DIET SOFT Room service appropriate?: Yes; Fluid consistency:: Thin  EDUCATION NEEDS:  Education needs no appropriate at this time   Intake/Output Summary (Last 24 hours) at 09/10/14 1613 Last data filed at 09/10/14 1359  Gross per 24 hour  Intake   1550 ml  Output   1456 ml  Net     94 ml    Last BM:  6/8  Marijean Niemann, MS, RD, LDN Pager # [phone] After hours/ weekend pager # [phone]

## 2014-09-10 NOTE — Progress Notes (Signed)
PT Cancellation Note  Patient Details Name: [name] MRN: [id number] DOB: 1956/02/07   Cancelled Treatment:    Reason Eval/Treat Not Completed: Patient at procedure or test/unavailable.  Will follow.    Ralene Bathe Kistler 09/10/2014, 12:03 PM [phone]

## 2014-09-10 NOTE — Progress Notes (Signed)
OT Cancellation Note  Patient Details Name: Marvis C Wescott MRN: [id number] DOB: 09-26-1955   Cancelled Treatment: Reason Eval/Treat Not Completed: Patient at procedure or test/unavailable (dialysis)  Garald Braver, OTR/L Acute Rehab Services [phone]  Yeshaya Vath 09/10/2014, 9:38 am

## 2014-09-10 NOTE — Progress Notes (Signed)
09/09/2014 10:15 PM  Patient's bp checked by nurse tech, 76/50. PRN order for 100 ml of NS implemented. Recheck of bp after 30 mins, Automatic: 86/54 and manually :70/50 . Second dose of PRN order of 100 ml of NS implemented. Bp rechecked after 30 mins : 96/60. Patient shows no signs of being in distress. RN will continue to monitor patient.  Veatrice Kells, RN

## 2014-09-10 NOTE — Progress Notes (Signed)
[name], female   DOB: 01/10/1956, 60 y.o.   MRN: [id number]     [facility] [place], [place] [address]    Phone: [phone] FAX: [phone]     Subjective: Sore.  Eating only 10-20% of her meals.  Scared to eat, appetite is fair. Afebrile.  VSS.    Objective:  Vital signs:  Filed Vitals:   09/10/14 0800 09/10/14 0830 09/10/14 0900 09/10/14 0930  BP: 92/68 99/75 109/79 105/72  Pulse: 99 99 106 108  Temp:      TempSrc:      Resp:      Height:      Weight:      SpO2:        Last BM Date: 09/09/14  Intake/Output   Yesterday:  06/08 0701 - 06/09 0700 In: 0034 [P.O.:480; I.V.:1130] Out: 10 [Blood:10] This shift: I/O last 3 completed shifts: In: 9179 [P.O.:600; I.V.:1130] Out: 10 [Blood:10]    Physical Exam: General: Pt awake/alert/oriented x4 in no acute distress  Abdomen: Soft.  Nondistended.  Non tender. No output from RUQ drain. Midline dressing dry, wound not examined. No evidence of peritonitis.  No incarcerated hernias.    Problem List:   Principal Problem:   Acute respiratory failure with hypoxia Active Problems:   Cerebral thrombosis with cerebral infarction   Hypertension   ESRD needing dialysis   Obesity (BMI 30-39.9)   Depression   Left renal mass   Chronic diastolic heart failure   X5A5 influenza   Tobacco use disorder   Renal failure (ARF), acute on chronic   Hyperlipidemia   PSVT (paroxysmal supraventricular tachycardia)   SOB (shortness of breath)   Perforated gastric ulcer   Abnormal TSH   History of intermediate V/Q scan   HIT (heparin-induced thrombocytopenia)   Fistula   Abdominal pain   Palliative care encounter   ESRD (end stage renal disease)   SVT (supraventricular tachycardia)   Right heart failure   Duodenal anastomotic leak   Protein calorie malnutrition   Itching   Aortic aneurysm   Protein-calorie malnutrition   Sepsis    Duodenal fistula    Results:   Labs: Results for orders placed or performed during the hospital encounter of 06/17/14 (from the past 48 hour(s))  Renal function panel     Status: Abnormal   Collection Time: 09/08/14 11:00 AM  Result Value Ref Range   Sodium 129 (L) 135 - 145 mmol/L   Potassium 4.2 3.5 - 5.1 mmol/L   Chloride 89 (L) 101 - 111 mmol/L   CO2 23 22 - 32 mmol/L   Glucose, Bld 78 65 - 99 mg/dL   BUN 42 (H) 6 - 20 mg/dL   Creatinine, Ser 5.32 (H) 0.44 - 1.00 mg/dL   Calcium 8.0 (L) 8.9 - 10.3 mg/dL   Phosphorus 8.5 (H) 2.5 - 4.6 mg/dL   Albumin 2.1 (L) 3.5 - 5.0 g/dL   GFR calc non Af Amer 8 (L) >60 mL/min   GFR calc Af Amer 9 (L) >60 mL/min    Comment: (NOTE) The eGFR has been calculated using the CKD EPI equation. This calculation has not been validated in all clinical situations. eGFR's persistently <60 mL/min signify possible Chronic Kidney Disease.    Anion gap 17 (H) 5 - 15  Parathyroid hormone, intact (no Ca)     Status: Abnormal   Collection Time: 09/08/14 11:00 AM  Result Value Ref Range   PTH 346 (H) 15 - 65 pg/mL    Comment: (NOTE) Performed At: [facility] [place], [place] [id number] Lindon Romp MD [id number]   Iron and TIBC     Status: Abnormal   Collection Time: 09/08/14 11:00 AM  Result Value Ref Range   Iron 37 28 - 170 ug/dL   TIBC 160 (L) 250 - 450 ug/dL   Saturation Ratios 23 10.4 - 31.8 %   UIBC 123 ug/dL  CBC     Status: Abnormal   Collection Time: 09/09/14  5:13 AM  Result Value Ref Range   WBC 9.2 4.0 - 10.5 K/uL   RBC 3.24 (L) 3.87 - 5.11 MIL/uL   Hemoglobin 9.3 (L) 12.0 - 15.0 g/dL   HCT 30.1 (L) 36.0 - 46.0 %   MCV 92.9 78.0 - 100.0 fL   MCH 28.7 26.0 - 34.0 pg   MCHC 30.9 30.0 - 36.0 g/dL   RDW 20.5 (H) 11.5 - 15.5 %   Platelets 376 150 - 400 K/uL  Basic metabolic panel     Status: Abnormal   Collection Time: 09/09/14  5:13 AM  Result Value Ref Range   Sodium 133 (L) 135 - 145 mmol/L    Potassium 3.7 3.5 - 5.1 mmol/L   Chloride 94 (L) 101 - 111 mmol/L   CO2 26 22 - 32 mmol/L   Glucose, Bld 105 (H) 65 - 99 mg/dL   BUN 21 (H) 6 - 20 mg/dL   Creatinine, Ser 3.58 (H) 0.44 - 1.00 mg/dL   Calcium 8.0 (L) 8.9 - 10.3 mg/dL   GFR calc non Af Amer 13 (L) >60 mL/min   GFR calc Af Amer 15 (L) >60 mL/min    Comment: (NOTE) The eGFR has been calculated using the CKD EPI equation. This calculation has not been validated in all clinical situations. eGFR's persistently <60 mL/min signify possible Chronic Kidney Disease.    Anion gap 13 5 - 15  Surgical pcr screen     Status: None   Collection Time: 09/09/14  6:45 AM  Result Value Ref Range   MRSA, PCR NEGATIVE NEGATIVE   Staphylococcus aureus NEGATIVE NEGATIVE    Comment:        The Xpert SA Assay (FDA approved for NASAL specimens in patients over 74 years of age), is one component of a comprehensive surveillance program.  Test performance has been validated by [facility] for patients greater than or equal to 48 year old. It is not intended to diagnose infection nor to guide or monitor treatment.   Renal function panel     Status: Abnormal   Collection Time: 09/09/14  9:20 PM  Result Value Ref Range   Sodium 135 135 - 145 mmol/L   Potassium 3.6 3.5 - 5.1 mmol/L   Chloride 96 (L) 101 - 111 mmol/L   CO2 25 22 - 32 mmol/L   Glucose, Bld 166 (H) 65 - 99 mg/dL   BUN 29 (H) 6 - 20 mg/dL   Creatinine, Ser 4.35 (H) 0.44 - 1.00 mg/dL   Calcium 8.4 (L) 8.9 - 10.3 mg/dL   Phosphorus 7.7 (H) 2.5 - 4.6 mg/dL   Albumin 2.1 (L) 3.5 - 5.0 g/dL   GFR calc non Af Amer 10 (L) >60 mL/min   GFR calc Af Amer 12 (L) >60 mL/min    Comment: (NOTE) The eGFR has been calculated using the CKD EPI equation. This calculation has not  been validated in all clinical situations. eGFR's persistently <60 mL/min signify possible Chronic Kidney Disease.    Anion gap 14 5 - 15  CBC     Status: Abnormal   Collection Time: 09/09/14  9:20 PM   Result Value Ref Range   WBC 8.8 4.0 - 10.5 K/uL   RBC 3.25 (L) 3.87 - 5.11 MIL/uL   Hemoglobin 9.5 (L) 12.0 - 15.0 g/dL   HCT 30.3 (L) 36.0 - 46.0 %   MCV 93.2 78.0 - 100.0 fL   MCH 29.2 26.0 - 34.0 pg   MCHC 31.4 30.0 - 36.0 g/dL   RDW 20.3 (H) 11.5 - 15.5 %   Platelets 395 150 - 400 K/uL  CBC     Status: Abnormal   Collection Time: 09/10/14  7:41 AM  Result Value Ref Range   WBC 9.8 4.0 - 10.5 K/uL   RBC 3.01 (L) 3.87 - 5.11 MIL/uL   Hemoglobin 8.8 (L) 12.0 - 15.0 g/dL   HCT 27.9 (L) 36.0 - 46.0 %   MCV 92.7 78.0 - 100.0 fL   MCH 29.2 26.0 - 34.0 pg   MCHC 31.5 30.0 - 36.0 g/dL   RDW 20.2 (H) 11.5 - 15.5 %   Platelets 378 150 - 400 K/uL  Comprehensive metabolic panel     Status: Abnormal   Collection Time: 09/10/14  7:41 AM  Result Value Ref Range   Sodium 134 (L) 135 - 145 mmol/L   Potassium 4.0 3.5 - 5.1 mmol/L   Chloride 95 (L) 101 - 111 mmol/L   CO2 23 22 - 32 mmol/L   Glucose, Bld 108 (H) 65 - 99 mg/dL   BUN 38 (H) 6 - 20 mg/dL   Creatinine, Ser 4.67 (H) 0.44 - 1.00 mg/dL   Calcium 8.0 (L) 8.9 - 10.3 mg/dL   Total Protein 6.8 6.5 - 8.1 g/dL   Albumin 2.1 (L) 3.5 - 5.0 g/dL   AST 16 15 - 41 U/L   ALT 9 (L) 14 - 54 U/L   Alkaline Phosphatase 130 (H) 38 - 126 U/L   Total Bilirubin 0.8 0.3 - 1.2 mg/dL   GFR calc non Af Amer 9 (L) >60 mL/min   GFR calc Af Amer 11 (L) >60 mL/min    Comment: (NOTE) The eGFR has been calculated using the CKD EPI equation. This calculation has not been validated in all clinical situations. eGFR's persistently <60 mL/min signify possible Chronic Kidney Disease.    Anion gap 16 (H) 5 - 15  Phosphorus     Status: Abnormal   Collection Time: 09/10/14  7:41 AM  Result Value Ref Range   Phosphorus 8.2 (H) 2.5 - 4.6 mg/dL    Imaging / Studies: No results found.  Medications / Allergies:  Scheduled Meds: . ALPRAZolam  0.5 mg Oral Q T,Th,Sa-HD  . amiodarone  400 mg Oral BID  . antiseptic oral rinse  7 mL Mouth Rinse q12n4p  .  aspirin  81 mg Oral Daily  . budesonide (PULMICORT) nebulizer solution  0.25 mg Nebulization BID  . calcitRIOL  0.5 mcg Oral Q T,Th,Sat-1800  . chlorhexidine  15 mL Mouth Rinse BID  . Darbepoetin Alfa      . darbepoetin (ARANESP) injection - DIALYSIS  200 mcg Intravenous Q Thu-HD  . feeding supplement (RESOURCE BREEZE)  1 Container Oral TID BM  . feeding supplement (VITAL AF 1.2 CAL)  1,000 mL Per Tube Q24H  . ferric gluconate (FERRLECIT/NULECIT) IV  125 mg  Intravenous Q T,Th,Sa-HD  . ipratropium  0.5 mg Nebulization TID  . levalbuterol  0.63 mg Nebulization TID  . levothyroxine  25 mcg Oral QAC breakfast  . metroNIDAZOLE  500 mg Oral 3 times per day  . multivitamin  1 tablet Oral QHS  . sodium chloride  10-40 mL Intracatheter Q12H   Continuous Infusions: . sodium chloride 10 mL/hr at 09/09/14 1849   PRN Meds:.sodium chloride, sodium chloride, bisacodyl, camphor-menthol, diphenhydrAMINE, diphenhydrAMINE-zinc acetate, diphenoxylate-atropine, feeding supplement (NEPRO CARB STEADY), HYDROmorphone (DILAUDID) injection, levalbuterol, lidocaine (PF), lidocaine-prilocaine, LORazepam, ondansetron (ZOFRAN) IV, oxyCODONE, pentafluoroprop-tetrafluoroeth, sodium chloride  Antibiotics: Anti-infectives    Start     Dose/Rate Route Frequency Ordered Stop   09/09/14 0815  cefUROXime (ZINACEF) 1.5 g in dextrose 5 % 50 mL IVPB     1.5 g 100 mL/hr over 30 Minutes Intravenous To Surgery 09/08/14 0806 09/09/14 1445   09/09/14 0600  cefUROXime (ZINACEF) 1.5 g in dextrose 5 % 50 mL IVPB  Status:  Discontinued     1.5 g 100 mL/hr over 30 Minutes Intravenous On call to O.R. 09/08/14 6314 09/08/14 0817   09/09/14 0400  metroNIDAZOLE (FLAGYL) tablet 500 mg     500 mg Oral 3 times per day 09/08/14 2304     08/27/14 1900  cefTAZidime (FORTAZ) 2 g in dextrose 5 % 50 mL IVPB  Status:  Discontinued     2 g 100 mL/hr over 30 Minutes Intravenous Every T-Th-Sa (Hemodialysis) 08/27/14 1846 09/08/14 0713   08/27/14  1900  metroNIDAZOLE (FLAGYL) IVPB 500 mg  Status:  Discontinued     500 mg 100 mL/hr over 60 Minutes Intravenous Every 8 hours 08/27/14 1847 09/08/14 2304   08/21/14 1430  cefTAZidime (FORTAZ) 1 g in dextrose 5 % 50 mL IVPB     1 g 100 mL/hr over 30 Minutes Intravenous  Once 08/21/14 1426 08/21/14 1547   08/18/14 1300  metroNIDAZOLE (FLAGYL) IVPB 500 mg  Status:  Discontinued     500 mg 100 mL/hr over 60 Minutes Intravenous 3 times per day 08/18/14 1045 08/27/14 1847   08/18/14 1200  cefTAZidime (FORTAZ) 2 g in dextrose 5 % 50 mL IVPB  Status:  Discontinued     2 g 100 mL/hr over 30 Minutes Intravenous Every T-Th-Sa (Hemodialysis) 08/18/14 1054 08/27/14 1846   08/12/14 1449  ceFAZolin (ANCEF) 2-3 GM-% IVPB SOLR    Comments:  Shaaron Adler   : cabinet override      08/12/14 1449 08/12/14 1629   08/12/14 1130  ceFAZolin (ANCEF) IVPB 2 g/50 mL premix    Comments:  Hang ON CALL to xray 5/11   2 g 100 mL/hr over 30 Minutes Intravenous To Radiology 08/12/14 1035 08/12/14 1520   08/04/14 1200  vancomycin (VANCOCIN) 50 mg/mL oral solution 125 mg  Status:  Discontinued     125 mg Oral 4 times per day 08/04/14 0851 08/08/14 1229   07/18/14 1800  vancomycin (VANCOCIN) 50 mg/mL oral solution 125 mg  Status:  Discontinued     125 mg Oral 4 times per day 07/18/14 1455 08/04/14 0845   07/17/14 1830  vancomycin (VANCOCIN) IVPB 750 mg/150 ml premix     750 mg 150 mL/hr over 60 Minutes Intravenous  Once 07/17/14 1818 07/18/14 0024   07/15/14 1400  metroNIDAZOLE (FLAGYL) tablet 500 mg  Status:  Discontinued     500 mg Oral 3 times per day 07/15/14 1202 07/18/14 1533   07/13/14 2200  piperacillin-tazobactam (ZOSYN) IVPB 2.25 g  Status:  Discontinued     2.25 g 100 mL/hr over 30 Minutes Intravenous 3 times per day 07/13/14 1318 07/24/14 1138   07/13/14 2000  fluconazole (DIFLUCAN) IVPB 200 mg  Status:  Discontinued     200 mg 100 mL/hr over 60 Minutes Intravenous Every 24 hours 07/13/14 1318 07/19/14  1027   07/07/14 1200  vancomycin (VANCOCIN) IVPB 1000 mg/200 mL premix  Status:  Discontinued     1,000 mg 200 mL/hr over 60 Minutes Intravenous Every 24 hours 07/07/14 0937 07/14/14 1712   07/06/14 2000  fluconazole (DIFLUCAN) IVPB 400 mg  Status:  Discontinued     400 mg 100 mL/hr over 120 Minutes Intravenous Every 24 hours 07/06/14 1507 07/13/14 1318   07/06/14 1800  piperacillin-tazobactam (ZOSYN) IVPB 2.25 g  Status:  Discontinued     2.25 g 100 mL/hr over 30 Minutes Intravenous 4 times per day 07/06/14 1505 07/13/14 1318   07/05/14 2000  fluconazole (DIFLUCAN) IVPB 200 mg  Status:  Discontinued     200 mg 100 mL/hr over 60 Minutes Intravenous Every 24 hours 07/05/14 0757 07/06/14 1507   07/05/14 1400  piperacillin-tazobactam (ZOSYN) IVPB 2.25 g  Status:  Discontinued     2.25 g 100 mL/hr over 30 Minutes Intravenous 3 times per day 07/05/14 0749 07/06/14 1505   07/02/14 1800  vancomycin (VANCOCIN) IVPB 1000 mg/200 mL premix  Status:  Discontinued     1,000 mg 200 mL/hr over 60 Minutes Intravenous Every 24 hours 07/01/14 1858 07/05/14 0801   07/01/14 2200  piperacillin-tazobactam (ZOSYN) IVPB 3.375 g  Status:  Discontinued     3.375 g 100 mL/hr over 30 Minutes Intravenous 4 times per day 07/01/14 1858 07/05/14 0749   07/01/14 2000  fluconazole (DIFLUCAN) IVPB 400 mg  Status:  Discontinued     400 mg 100 mL/hr over 120 Minutes Intravenous Every 24 hours 07/01/14 1858 07/05/14 0757   07/01/14 1400  fluconazole (DIFLUCAN) IVPB 200 mg  Status:  Discontinued     200 mg 100 mL/hr over 60 Minutes Intravenous Every 24 hours 07/01/14 1330 07/01/14 1853   07/01/14 1000  piperacillin-tazobactam (ZOSYN) IVPB 2.25 g  Status:  Discontinued     2.25 g 100 mL/hr over 30 Minutes Intravenous Every 8 hours 07/01/14 0952 07/01/14 1853   07/01/14 1000  vancomycin (VANCOCIN) 500 mg in sodium chloride 0.9 % 100 mL IVPB     500 mg 100 mL/hr over 60 Minutes Intravenous  Once 07/01/14 0952 07/01/14 1126    06/30/14 1200  vancomycin (VANCOCIN) IVPB 1000 mg/200 mL premix     1,000 mg 200 mL/hr over 60 Minutes Intravenous  Once 06/30/14 0944 06/30/14 1403   06/30/14 1200  ceFEPIme (MAXIPIME) 2 g in dextrose 5 % 50 mL IVPB     2 g 100 mL/hr over 30 Minutes Intravenous  Once 06/30/14 0944 06/30/14 1425   06/30/14 0100  vancomycin (VANCOCIN) 2,000 mg in sodium chloride 0.9 % 500 mL IVPB     2,000 mg 250 mL/hr over 120 Minutes Intravenous  Once 06/30/14 0048 06/30/14 0525   06/30/14 0100  ceFEPIme (MAXIPIME) 2 g in dextrose 5 % 50 mL IVPB     2 g 100 mL/hr over 30 Minutes Intravenous  Once 06/30/14 0048 06/30/14 0322   06/27/14 0600  cefUROXime (ZINACEF) 1.5 g in dextrose 5 % 50 mL IVPB     1.5 g 100 mL/hr over 30 Minutes Intravenous On call to O.R. 06/26/14 1019 06/27/14 0630  06/19/14 1800  oseltamivir (TAMIFLU) capsule 30 mg     30 mg Oral Every M-W-F (1800) 06/19/14 1003 06/22/14 1841   06/18/14 1600  oseltamivir (TAMIFLU) capsule 30 mg  Status:  Discontinued     30 mg Oral Daily 06/18/14 1538 06/19/14 1003          Assessment/Plan: POD #71 s/p Exploratory Laparotomy with graham patch closure of perforated pyloric ulcer, SBR for ischemic bowel - 07/01/2014 - Dalbert Batman  Intra-abdominal (pelvic) fluid collection Duodenal fistula -Will proceed with a CT scan to evaluate for a fistula or surrounding fluid collection.  If negative, will remove that red rubber drain.  If positive and malpositioned, will ask IR to replace, again.  -continue POs, G tube clamped and nocturnal J tube feeds.  -abdominal binder at all times  -BID wet to dry dressing changes VTE prophylaxis-SCD, HIT+ ESRD POD#1 revision of AV fistula FEN-soft diet, TF at night through J tube.  Dispo-await CT results   Erby Pian, [facility] Pager [phone]) For consults and floor pages call [phone](7A-4:30P)  09/10/2014 10:07 AM

## 2014-09-10 NOTE — Care Management Note (Signed)
Case Management Note  Patient Details  Name: [name] MRN: [id number] Date of Birth: [birth year]  Subjective/Objective:         CM following for progression and d/c planning.           Action/Plan: 09/10/14 Spoke with CIR adm coord, Genie re plan for this pt. Per hemidialysis center they have stared the CLIP process and are currently awaiting seat for outpatient HD. The CLIP process needs to be completed before this pt can go to CIR.  Noted surgery note re abd drain. If drain is removed the pt would be ready of CIR if the drain needs to be replaced she will not be able to go to CIR.   Expected Discharge Date:   (unknown)               Expected Discharge Plan:  IP Rehab Facility  In-House Referral:  NA  Discharge planning Services     Post Acute Care Choice:  NA Choice offered to:  NA  DME Arranged:    DME Agency:     HH Arranged:    HH Agency:     Status of Service:  In process, will continue to follow  Medicare Important Message Given:  No Date Medicare IM Given:    Medicare IM give by:    Date Additional Medicare IM Given:    Additional Medicare Important Message give by:     If discussed at Long Length of Stay Meetings, dates discussed:    Additional Comments:  Starlyn Skeans, RN 09/10/2014, 2:46 PM

## 2014-09-10 NOTE — Procedures (Signed)
I was present at this session.  I have reviewed the session itself and made appropriate changes.  HD via PC, bp 80-110.  Cath flow 400  Deral Schellenberg L 6/9/20168:14 AM

## 2014-09-10 NOTE — PMR Pre-admission (Signed)
PMR Admission Coordinator Pre-Admission Assessment  Patient: [name] is an 59 y.o., female MRN: [id number] DOB: [birth year] Height: 5\' 6"  (167.6 cm) Weight:  (uta- pt in recliner)              Insurance Information HMO: No    PPO:       PCP:       IPA:       80/20:       OTHER:   PRIMARY:  Medicaid [place] access      Policy#: [id number]      Subscriber: Nairi Moe CM Name:        Phone#:       Fax#:   Pre-Cert#:        Employer: Not employed Benefits:  Phone #: [phone]     Name: Automated Eff. Date: 08/17/14 eligible     Deduct:        Out of Pocket Max:        Life Max:   CIR:        SNF:   Outpatient:       Co-Pay:   Home Health:        Co-Pay:   DME:       Co-Pay:   Providers:    Emergency Contact Information Contact Information    Name Relation Home Work Mobile   Hole,[name] Daughter [phone]       Current Medical History  Patient Admitting Diagnosis:  R ACA infarct  History of Present Illness:A 59 y.o. right handed female with history of stage V chronic kidney disease status post fistula placement followed by nephrologist at [facility] [name], hypertension, tobacco abuse. Independent prior to admission living with her daughter. Presented 06/17/2014 to [facility] with progressive shortness of breath 2 weeks and lower extremity edema. By report patient had stopped taking her diuretics 2 weeks prior to admission. Findings of elevated creatinine 5.5 baseline of 2.64. Also noted poor appetite related to symptoms of upper respiratory infection. Chest x-ray showed no infiltrate or failure. Renal ultrasound with no hydronephrosis. 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 Nephrology service follow-up placed on intravenous Lasix. Neurology consulted for left sided weakness. MRI of the brain 06/20/2014 shows multifocal areas of acute infarct throughout both  hemispheres with the largest involving the right medial parasagittal frontal and posterior frontal cortex. MRA with no large vessel occlusion. Carotid Dopplers with no ICA stenosis. Echocardiogram with ejection fraction 65% and grade 1 diastolic dysfunction. Patient placed on full dose aspirin therapy. TEE performed showing normal LV function and no thrombus. Positive bubble study indicating a small PFO. Lower extremity Dopplers negative for DVT. Underwent placement of loop recorder 06/25/2014 per Dr. Graciela Husbands. Patient with intermittent runs of PSVT placed on beta blocker. Renal function without change and hemodialysis initiated right internal jugular DIATEK catheter placed 06/27/2014 and later with placement of left arm AV fistula 09/09/2014. Developed acute abdominal pain 07/01/2014. CT scan showed massive pneumoperitoneum and ascites and also pneumatosis of a short segment of the ileum. General surgery consulted underwent exploratory laparotomy evacuation of ascites with closure of perforated pyloric ulcer with omental patch and segmental resection of ileum 07/01/2014 per Dr. Derrell Lolling. Patient required full ventilatory support followed by critical care medicine. Wound care nurse consulted for need for VAC to abdominal wound. Placed on contact precautions for Clostridium difficile. Patient was NPO with interventional radiology placing gastrostomy tube 08/12/2014 as well as receiving  TPN.  Have discontinued TPN and diet advanced to mechanical soft. Therapies initiated slow overall progress. M.D. has requested physical medicine rehabilitation. Patient to be admitted for a comprehensive inpatient rehabilitation program.  Past Medical History  Past Medical History  Diagnosis Date  . Asthma   . Hypertension   . Gout   . Arthritis   . Insomnia   . Chronic kidney disease   . Depression   . Renal insufficiency   . Aortic aneurysm without rupture     3cm by CT 08/03/14  . Paroxysmal SVT (supraventricular  tachycardia)   . Cor pulmonale   . Stroke 06/2014  . PFO (patent foramen ovale)   . COPD (chronic obstructive pulmonary disease)     Family History  family history includes Diabetes in her father, mother, and sister; Heart attack in her mother and sister; Heart disease in her brother, father, mother, and sister; Hyperlipidemia in her daughter; Hypertension in her father, mother, and sister; Peripheral vascular disease in her mother and sister.  Prior Rehab/Hospitalizations: No previous rehab admissions  Has the patient had major surgery during 100 days prior to admission? No.  However has had surgeries this admission.  Current Medications   Current facility-administered medications:  .  0.9 %  sodium chloride infusion, , Intravenous, Continuous, Jeanella Craze, NP, Last Rate: 10 mL/hr at 09/09/14 1849 .  ALPRAZolam Prudy Feeler) tablet 0.5 mg, 0.5 mg, Oral, Q T,Th,Sa-HD, Arita Miss, MD, 0.5 mg at 09/10/14 0706 .  amiodarone (PACERONE) tablet 400 mg, 400 mg, Oral, BID, Catarina Hartshorn, MD, 400 mg at 09/11/14 1007 .  antiseptic oral rinse (CPC / CETYLPYRIDINIUM CHLORIDE 0.05%) solution 7 mL, 7 mL, Mouth Rinse, q12n4p, Belkys A Regalado, MD, 7 mL at 09/11/14 1147 .  aspirin chewable tablet 81 mg, 81 mg, Oral, Daily, Zannie Cove, MD, 81 mg at 09/11/14 1003 .  bisacodyl (DULCOLAX) suppository 10 mg, 10 mg, Rectal, Daily PRN, Nonie Hoyer, PA-C .  budesonide (PULMICORT) nebulizer solution 0.25 mg, 0.25 mg, Nebulization, BID, Vassie Loll, MD, 0.25 mg at 09/11/14 0733 .  calcitRIOL (ROCALTROL) capsule 0.5 mcg, 0.5 mcg, Oral, Q T,Th,Sat-1800, Beryle Lathe, MD, 0.5 mcg at 09/10/14 1737 .  camphor-menthol (SARNA) lotion, , Topical, PRN, Ulice Bold, NP .  chlorhexidine (PERIDEX) 0.12 % solution 15 mL, 15 mL, Mouth Rinse, BID, Belkys A Regalado, MD, 15 mL at 09/11/14 0724 .  Darbepoetin Alfa (ARANESP) injection 200 mcg, 200 mcg, Intravenous, Q Thu-HD, Arita Miss, MD, 200 mcg at 09/03/14  1644 .  diphenhydrAMINE (BENADRYL) injection 12.5 mg, 12.5 mg, Intravenous, Q6H PRN, Vassie Loll, MD, 12.5 mg at 09/08/14 2215 .  diphenhydrAMINE-zinc acetate (BENADRYL) 2-0.1 % cream, , Topical, TID PRN, Zannie Cove, MD, 1 application at 08/09/14 1434 .  diphenoxylate-atropine (LOMOTIL) 2.5-0.025 MG per tablet 1 tablet, 1 tablet, Oral, QID PRN, Leda Gauze, NP, 1 tablet at 09/03/14 2304 .  feeding supplement (PRO-STAT SUGAR FREE 64) liquid 30 mL, 30 mL, Per Tube, TID, Marijean Niemann, RD, 30 mL at 09/11/14 1003 .  feeding supplement (RESOURCE BREEZE) (RESOURCE BREEZE) liquid 1 Container, 1 Container, Oral, TID BM, Ailene Ards, RD, 1 Container at 09/11/14 1002 .  feeding supplement (VITAL AF 1.2 CAL) liquid 1,000 mL, 1,000 mL, Per Tube, Q24H, Ailene Ards, RD, Stopped at 09/11/14 1035 .  ferric gluconate (NULECIT) 125 mg in sodium chloride 0.9 % 100 mL IVPB, 125 mg, Intravenous, Q T,Th,Sa-HD, Beryle Lathe, MD, 125 mg at 09/10/14 0910 .  HYDROmorphone (DILAUDID) injection 1-2 mg, 1-2 mg, Intravenous, Q3H PRN, Rhetta Mura, MD, 1 mg at 09/11/14 0203 .  ipratropium (ATROVENT) nebulizer solution 0.5 mg, 0.5 mg, Nebulization, TID, Catarina Hartshorn, MD, 0.5 mg at 09/11/14 0733 .  levalbuterol (XOPENEX) nebulizer solution 0.63 mg, 0.63 mg, Nebulization, Q4H PRN, Catarina Hartshorn, MD, 0.63 mg at 09/10/14 1234 .  levalbuterol (XOPENEX) nebulizer solution 0.63 mg, 0.63 mg, Nebulization, TID, Albertine Grates, MD, 0.63 mg at 09/11/14 0733 .  levothyroxine (SYNTHROID, LEVOTHROID) tablet 25 mcg, 25 mcg, Oral, QAC breakfast, Zannie Cove, MD, 25 mcg at 09/11/14 1007 .  LORazepam (ATIVAN) injection 1-2 mg, 1-2 mg, Intravenous, Daily PRN, Delano Metz, MD, 1 mg at 09/09/14 0345 .  metroNIDAZOLE (FLAGYL) tablet 500 mg, 500 mg, Oral, 3 times per day, Zannie Cove, MD, 500 mg at 09/11/14 0507 .  multivitamin (RENA-VIT) tablet 1 tablet, 1 tablet, Oral, QHS, Beryle Lathe, MD, 1 tablet at 09/10/14  2208 .  ondansetron (ZOFRAN) injection 4 mg, 4 mg, Intravenous, Q4H PRN, Oley Balm, MD .  oxyCODONE (Oxy IR/ROXICODONE) immediate release tablet 5 mg, 5 mg, Oral, Q6H PRN, Samantha J Rhyne, PA-C, 5 mg at 09/11/14 1002 .  sodium chloride 0.9 % injection 10-40 mL, 10-40 mL, Intracatheter, Q12H, Belkys A Regalado, MD, 10 mL at 09/09/14 0345 .  sodium chloride 0.9 % injection 10-40 mL, 10-40 mL, Intracatheter, PRN, Belkys A Regalado, MD, 10 mL at 09/11/14 0813  Patients Current Diet: DIET SOFT Room service appropriate?: Yes; Fluid consistency:: Thin  Precautions / Restrictions Precautions Precautions: Fall Precaution Comments: G tube, abdominal binder, abdominal wound Other Brace/Splint: abdominal binder Restrictions Weight Bearing Restrictions: No   Has the patient had 2 or more falls or a fall with injury in the past year?No  Prior Activity Level Limited Community (1-2x/wk): Micah Flesher out with daughter 3 X a week shopping or to appts.  Did drive short distances herself.  Has not worked in more than a year  Journalist, newspaper / Corporate investment banker Devices/Equipment: Medical laboratory scientific officer (specify quad or straight), Dentures (specify type) (full upper plate, partial lower plate) Home Equipment: None  Prior Device Use: Indicate devices/aids used by the patient prior to current illness, exacerbation or injury? None of the above.  Used a straight cane on occasion at home when her gout flared up in her foot.  Prior Functional Level Prior Function Level of Independence: Independent  Self Care: Did the patient need help bathing, dressing, using the toilet or eating?  Needed some help.  Needed help when gout flared up with dressing and shoes.  Indoor Mobility: Did the patient need assistance with walking from room to room (with or without device)? Independent  Stairs: Did the patient need assistance with internal or external stairs (with or without device)? Independent  Functional Cognition: Did  the patient need help planning regular tasks such as shopping or remembering to take medications? Needed some help.  Daughter has been assisting with appointments, shopping and driving.  Current Functional Level Cognition  Overall Cognitive Status: Within Functional Limits for tasks assessed Current Attention Level: Selective Orientation Level: Oriented X4 General Comments: Pt tearful, grateful for her progress and R drain removal today.    Extremity Assessment (includes Sensation/Coordination)  Upper Extremity Assessment: Defer to OT evaluation LUE Deficits / Details: Pt with minimal shoulder elevation, scapular depression and adduction; forearm sup/pron 3/5; finger flex/ext 2+/5 LUE Sensation: decreased proprioception LUE Coordination: decreased fine motor, decreased gross motor  Lower Extremity Assessment: Defer to PT evaluation LLE Deficits /  Details: unable to activate left LE for motor function in supine; supported weight some with left LE sitting LLE Sensation: decreased proprioception LLE Coordination: decreased fine motor, decreased gross motor    ADLs  Overall ADL's : Needs assistance/impaired Eating/Feeding: Set up, Sitting Eating/Feeding Details (indicate cue type and reason): pt now on clear liquids, self fed jello holding bowl with L hand Grooming: Wash/dry hands, Wash/dry face, Sitting, Set up Grooming Details (indicate cue type and reason): used mouthwash, washed face including wringing out washcloth Upper Body Bathing: Minimal assitance, Sitting Upper Body Bathing Details (indicate cue type and reason): washed back for her and rubbed with anti itch lotion Lower Body Bathing: Moderate assistance, Sitting/lateral leans, Sit to/from stand Lower Body Bathing Details (indicate cue type and reason): pt washed tops of legs and front periarea, assist for back side and feet Upper Body Dressing : Set up, Bed level Lower Body Dressing: Minimal assistance, Sitting/lateral  leans Lower Body Dressing Details (indicate cue type and reason): in long sitting, pt able to donn R sock, min assist to donn L sock Toilet Transfer: Minimal assistance, Stand-pivot, RW, BSC Toilet Transfer Details (indicate cue type and reason): bed to 3 in 1 Toileting- Clothing Manipulation and Hygiene: Supervision/safety, Sitting/lateral lean Toileting - Clothing Manipulation Details (indicate cue type and reason): performed pericare after urinating Functional mobility during ADLs: +2 for physical assistance, Minimal assistance, Rolling walker, Cueing for sequencing General ADL Comments: Pt with diarrhea, assisted to bedpan due to urgency and for pericare and change of gown and bed linens.  Pt apologetic.  Left pt in bed due to frequency of BMs at pt's request, does not want to not be able to get to [facility] in time.    Mobility  Overal bed mobility: Needs Assistance Bed Mobility: Rolling, Sit to Supine, Sidelying to Sit Rolling: Supervision Sidelying to sit: Min guard Supine to sit: Supervision, HOB elevated Sit to supine: Min assist Sit to sidelying: +2 for physical assistance, Max assist General bed mobility comments: cues for sequence with HOB 20degrees and increased time but pt able to fully transfer to EOB without assist today    Transfers  Overall transfer level: Needs assistance Equipment used: None, 1 person hand held assist Transfers: Sit to/from Stand, Stand Pivot Transfers Sit to Stand: Mod assist Stand pivot transfers: Mod assist Squat pivot transfers: Mod assist, +2 safety/equipment General transfer comment: mod assist from low surface, min assist from higher surface x 2 trials. cues for hand placement and anterior translation    Ambulation / Gait / Stairs / Wheelchair Mobility  Ambulation/Gait Ambulation/Gait assistance: Min assist Ambulation Distance (Feet): 88 Feet Assistive device: Rolling walker (2 wheeled) General Gait Details: Pt walked 28' then 41' with RW and  seated rest. Cues for posture and position in RW Gait Pattern/deviations: Step-through pattern, Decreased stride length, Trunk flexed, Wide base of support Gait velocity interpretation: Below normal speed for age/gender    Posture / Balance Dynamic Sitting Balance Sitting balance - Comments: more assist to shift L hip to EOB vs R, used bed pad Balance Overall balance assessment: Needs assistance Sitting-balance support: No upper extremity supported, Feet supported Sitting balance-Leahy Scale: Good Sitting balance - Comments: more assist to shift L hip to EOB vs R, used bed pad Postural control: Posterior lean Standing balance support: No upper extremity supported, During functional activity Standing balance-Leahy Scale: Poor Standing balance comment: remains heavily reliant on UEs    Special needs/care/consideration BiPAP/CPAP No CPM No Continuous Drip IV KVO Dialysis Yes,  new HD        DaysT-TH-Sat Life Vest No Oxygen No Special Bed No Trach Size No Wound Vac (area) No      Skin Had abdominal and arm incisions and dressings                              Bowel mgmt: Last documented BM 09/11/14 Bladder mgmt: Oliguria Diabetic mgmt No    Previous Home Environment Living Arrangements: Children Available Help at Discharge: Family Type of Home: Apartment Home Layout: Able to live on main level with bedroom/bathroom Home Access: Stairs to enter Secretary/administrator of Steps: 12 Home Care Services: No Additional Comments: tub shower, standard height toilets  Discharge Living Setting Plans for Discharge Living Setting: Patient's home, Lives with (comment) (Dtr lives with patient.) Type of Home at Discharge: House (Lives in a townhouse.) Discharge Home Layout: Two level, 1/2 bath on main level, Bed/bath upstairs Alternate Level Stairs-Number of Steps: Flight Discharge Home Access: Stairs to enter [employer] of Steps: 1 small step to porch and small step into  townhouse. Does the patient have any problems obtaining your medications?: No  Social/Family/Support Systems Patient Roles: Parent (Has a daughter and a son.) Contact Information: Roxy Horseman Anticipated Caregiver: daughter and son Anticipated Caregiver's Contact Information: [name] - daughter [phone] Ability/Limitations of Caregiver: Daughter works 2:30p to 11 pm.  Son works a flexible schedule and comes home at 12 noon up to 5 pm. Caregiver Availability: Other (Comment) (Will have close to 24/7 supervision.) Discharge Plan Discussed with Primary Caregiver: Yes Is Caregiver In Agreement with Plan?: Yes Does Caregiver/Family have Issues with Lodging/Transportation while Pt is in Rehab?: No  Goals/Additional Needs Patient/Family Goal for Rehab: PT/OT supervision to min assist goals Expected length of stay: 18-22 days Cultural Considerations: None Dietary Needs: Soft diet, thin liquids Equipment Needs: TBD Pt/Family Agrees to Admission and willing to participate: Yes Program Orientation Provided & Reviewed with Pt/Caregiver Including Roles  & Responsibilities: Yes  Decrease burden of Care through IP rehab admission: N/A  Possible need for SNF placement upon discharge: Not planned  Patient Condition: This patient's medical and functional status has changed since the consult dated: 08/17/14 in which the Rehabilitation Physician determined and documented that the patient's condition is appropriate for intensive rehabilitative care in an inpatient rehabilitation facility. See "History of Present Illness" (above) for medical update. Functional changes are: Currently requiring min assist for transfers and min assist to ambulate 82 ft RW. Patient's medical and functional status update has been discussed with the Rehabilitation physician and patient remains appropriate for inpatient rehabilitation. Will admit to inpatient rehab today.  Preadmission Screen Completed By:  Trish Mage, 09/11/2014 12:11 PM ______________________________________________________________________   Discussed status with Dr. Riley Kill on 09/11/14 at 1212 and received telephone approval for admission today.  Admission Coordinator:  Trish Mage, time1213/Date06/10/16

## 2014-09-10 NOTE — Progress Notes (Signed)
Rehab admissions - I understand that patient may be ready for inpatient rehab admission tomorrow.  I spoke with patients daughter who is in agreement to rehab here in the hospital.  I will check back in am to be sure patient is medically ready for rehab.  Call me for questions.  #[phone]

## 2014-09-10 NOTE — Progress Notes (Signed)
Subjective: Interval History: has complaints arm sore after op yest.  Objective: Vital signs in last 24 hours: Temp:  [97.4 F (36.3 C)-98.7 F (37.1 C)] 98.7 F (37.1 C) (06/09 0716) Pulse Rate:  [84-108] 99 (06/09 0800) Resp:  [18-28] 22 (06/09 0716) BP: (70-112)/(50-70) 92/68 mmHg (06/09 0800) SpO2:  [92 %-100 %] 93 % (06/09 0716) Weight:  [98.975 kg (218 lb 3.2 oz)] 98.975 kg (218 lb 3.2 oz) (06/09 0435) Weight change: 2.275 kg (5 lb 0.2 oz)  Intake/Output from previous day: 06/08 0701 - 06/09 0700 In: 1610 [P.O.:480; I.V.:1130] Out: 10 [Blood:10] Intake/Output this shift:    General appearance: alert, cooperative and no distress Resp: diminished breath sounds bilaterally and wheezes bibasilar Chest wall: RIJ PC, LIJ cath Cardio: S1, S2 normal and systolic murmur: holosystolic 2/6, blowing at apex GI: pos bs, binders, PEG,  Upper abdm RUQ drain Extremities: !+ pre sacral edema, LUA avf  Lab Results:  Recent Labs  09/09/14 2120 09/10/14 0741  WBC 8.8 9.8  HGB 9.5* 8.8*  HCT 30.3* 27.9*  PLT 395 378   BMET:  Recent Labs  09/09/14 2120 09/10/14 0741  NA 135 134*  K 3.6 4.0  CL 96* 95*  CO2 25 23  GLUCOSE 166* 108*  BUN 29* 38*  CREATININE 4.35* 4.67*  CALCIUM 8.4* 8.0*    Recent Labs  09/08/14 1100  PTH 346*   Iron Studies:  Recent Labs  09/08/14 1100  IRON 37  TIBC 160*    Studies/Results: No results found.  I have reviewed the patient's current medications.  Assessment/Plan: 1 ESRD for HD, stable 2 Anemia on epo/Fe 3 DM controlled 4 HPTH 5 Nutrition on po and TF 6 Anxiety 7 GU perf, abscess, fistula per Gen Surg 8 Access fistula revised 9 C diff resolved P HD, Nutrit, cont Fe epo,     LOS: 85 days   [name] L 09/10/2014,8:15 AM

## 2014-09-10 NOTE — Progress Notes (Signed)
PROGRESS NOTE  [name] FMB:[id number] DOB: Apr 11, 1955 DOA: 06/17/2014 PCP: [name], MD  Brief Narrative: 59 yo ? smoker with hx HTN, COPD, CKD IV followed @ [facility]-[name] Page, failed L AV fistula, initially admitted 3/16 with flu and acute hypoxic resp failure. Ultimately tx to [facility] due to need for HD. L sided weakness 3/18 and found to have R ACA stroke-showed acute infarct R ACA (3/19). Patient more lethargic 3/30 and CT abd=perf viscus 3/30. She was taken to the OR for ex lap for perforated pyloric ulcer with diffuse peritonitis, ischemic necrosis of mid ileum. Patient developed septic shock on 4-1, she was started on pressors and wean off pressors on 4-3. Subsequently, she was diagnosed with pelvic abscess by CT scan perform on 4-7. She underwent pelvic drain placement by IR on 4/8= culture neg 08/15/14 another drained placed in the pelvic cul-de-sac abscess with culture = pseudomonas. On 08/20/14, Gluteal drain was inadvertently pulled out. Repeat CT scan shows improvement in the fluid collection so decision was to leave it out and continue IV abx. Pt hospital stay also complicated by high output duodenal (enterocutaneous fistula) fistula. As a result, pt was made npo and started on TPN. Now stable, on TNA +high output from fistula/leak per [facility]. Had a G_J tube placed by Dr. [name] of IR and Gen surgery has continued to assist with management of her multiple surgical comorbidities C. diff diagnosed 4-12-initially unresponsive to flagyl, and she was started on PO vancomycin 4-16 which she finished 3 week.  5/27--TPN weaned off and increasing TF.  06/24/14 Develops run of SVT, atrial tachycardia. Cardiology was helping with management. Course complicated with high RV pressure and Cor Pulmonale; 4/27 CT angio negative for PE. On 5/2 she developed hypoxemia, hypotension and SVT during dialysis treatment. Dialysis was stopped. She received IV bolus. She was started on BIPAP.  She was transferred to Step down unit. PCCM and cardiology re consulted. Her Hr decreased, BP improved. She was taken off BIPAP. This recurred on 5/15 with episodic SVT. Since then she has had intermitten short episodes of SVT, but on 08/28/14 she has had more sustained SVT, therefore; cardiology was reconsulted on on 08/29/14.  Assessment/Plan: Status post exploratory laparotomy with closure of perforated pyloric ulcer/abdominal abscess / small bowel resection w/ postop ileus and now with enterocutaneous fistula. Pelvic abscess s/p percutaneous drain placement  -completed, long courses of IV  Abx including Vanc and FLuconazole -Due to Fistula, she was made NPO, TPN was started -Fistula study done shows JP drain connection to the duodenal bulb-5-02. - 08/15/2014 repeat CT abdomen with 3.0 x 3.5 x 5.7 cm collection in the pelvic cul-de-sac and extraluminal air alongside JP drain in anterior abd wall -had high output from Fistula/leak. Had G_J placed by IR 08/12/14 at request of gen surgery -CT repeat shows 5 cm post cul-de-sac abscess which was drained on 5/16-culture revealed pseudomonas aeruginosa -ID was consulted as patient had Pseudomonas growing from her culture wounds and they started back Flagyl as well as Elita Quick on 5/16  -Per ID recommendation--plan D#21/21 days of Elita Quick and D#21/ 28 days metronidazole, stop [place] after todays dose -Patient inadvertently pulled out her gluteal/cul-de-sac drain May 19. Seen by interventional radiology and they recommended repeat CT scan, which was done. Shows that the fluid collection has decreased in size. They do not plan to replace the drain at this time. Continue with antibiotics alone.  -08/21/2014 CT scan did suggest  a transmural defect in the stomach wall near the patient's pyloric Cheree Ditto patch. -Patient accidentally pulled out her JP drain on 5/22. This has been replaced by surgery on 5/23. -Octreotide was discontinued 5/23--enterocutaneous fistula  output appears to be decreasing gradually -finished TPN on 08/27/14 -Enteral Feeding was increased 5/27 to 55 mL per hour--tolerating without residuals -08/29/14--started clear liquids-->tolerating well -08/31/14--pt inadvertently pulled out R-side JP drain again (3rd time) during therapy + GJ tube with bloody drainage--contacted general surgery 09/01/14--IR replaced RUQ drain (entercutaneous fistula)--reminded pt to wear abd binder at all times; no output from this drain 6/1: started full liquids  6/3: started regular diet: ATE a [employer]!!-Huge milestone as far as patient is concerned, consider stop J tube feeding if able to take oral consistently, to be determined by [facility] DC to CIR when stable per [facility],  repeat  CT of abd on 6/9, plan per [facility]  PSVT --Atrial tachycardia -Felt to be secondary to fluid metabolic shifts from ESRD and ongoing abd issues; also has severe anxiety with dialysis as well as COPD/Cor Pulmonale  -was on IV metoprolol and amiodarone, now 5/31 switched to PO  -CTA 4/27, negative for PE  -Cards following peripherally  ESRD new, now on HD this admit complicated by intermittent hypotension AKI on CKD 4, due to sepsis, shock Ongoing hemodialysis per Nephrology - perm-cath placed 3/26 d/t AVF problems Patient AV fistula reviewed and not mature yet per Dr. Paulene Floor need to revisit prior to d/c -TPN weaned off 08/27/14 -Continue hemodialysis Tuesday, Thursday, Saturday while the patient is in the hospital -Xanax with each HD due to significant anxiety with HD -starting to sit up with HD now, BPs low will not be able to tolerate much volume removal  C. difficile colitis still on contact precautions -C diff positive on 4-12, Received 4 days of flagyl without improvement, then started on PO vanc -Completed 3 weeks of Oral Vanc-stopped 5/7, this was 1 week after IV Zosyn was stopped 5/5 -5/28--having loose stools again-->repeat Cdiff PCR negative on 5/30 -keep on contact  precautions, now on Po flagyl till 6/13  ACA CVA with Lt sided weakness -06/23/14--TEE ;normal LV function; no LAA thrombus -Started on ASA 81mg  per Neuro; resumed this -Will need ongoing long term rehab - PT/OT to cont to follow while inpatient -Patient remains very weak and deconditioned. 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 Fluids/volume managed with hemodialysis.  Elevated PA pressures on 2-D echo; CTA negative for PE - 07/23/2014 echo-- EF 60-61%, grade 1 diastolic dysfunction, Severe RV dilatation, PAP 90  Small PFO Has been evaluated by Cardiology. No intervention planned currently.  Anemia of critical illness/chronic disease -and bleeding from leak/fistula which has resolved. -s/p 2U PRBC 4/13 and 5/10 and 5/26 -Aranesp/iron per renal -transfuse for hemoglobin less than 7  Acute hypoxic Respiratory Failure:  -This was secondary to H1N1, hypervolemia, HCAP >> resolved.  -also has COPD and Cor Pulmonale, intermittent wheezing - now on scheduled Xopenex and Atrovent, change to PRN  - presently stable on 2 L-3L  Thrombocytopenia; resolved.  Appears to be associated with sepsis Has had mild positive HIT test during this admission   Septic shock  -due to perforated prepyloric ulcer, required pressors -resolved currently  Diabetes mellitus 2 Did have episodes of Hypoglycemia in setting of NPO status  -Hba1c 6.5 -CBGs stable now  Abnormal TSH -Free T4 WNL, T3 1.8 -most likely sick  thyroid with ongoing infection and body stress; also due to amiodarone use. TSH in 7 range -continue low dose synthroid given PSVT, changed to PO synthroid  L Renal mass  -noted on [place], Indeterminate 1.9 cm hypoechoic mass off the interpolar region of the left kidney -may potentially represent a cyst however solid mass is not excluded -needs outpatient FU for this  Fall on  May 18  Left lower extremity edema: negative for DVT by ultrasound on 5/3 and 6/3.  DVT prophylaxis: SCDs Code Status: FULL Family Communication: Discussed with patient. No family at bedside. Disposition Plan: Tx to tele,  Not accepted to LTAC, CIR when stable per [facility]  Consultants: Cardiology Nephrology PCCM Gen Surgery  Palliative care    Procedures/Studies: Ct Abdomen Pelvis Wo Contrast  09/10/2014   CLINICAL DATA:  Follow-up small bowel fistula  EXAM: CT ABDOMEN AND PELVIS WITHOUT CONTRAST  TECHNIQUE: Multidetector CT imaging of the abdomen and pelvis was performed following the standard protocol without IV contrast.  COMPARISON:  CT 08/21/2014, 08/03/2014  FINDINGS: Lower chest: Mild reticular nodular pattern in the left lung base is increased. Findings could represent resolving pneumonia or aspiration pneumonitis.  Hepatobiliary: There is no intrahepatic biliary duct dilatation. No pneumobilia. Percutaneous drainage catheter extending to the capsule of the right hepatic lobe at the chest wall. This is been retracted from the region of the porta hepatis on comparison CT.  High-density fluid in the gallbladder.  Pancreas: Pancreas is normal. No ductal dilatation. No pancreatic inflammation.  Spleen: Normal spleen  Adrenals/urinary tract: Adrenal glands and kidneys are normal. The ureters and bladder normal.  Stomach/Bowel: The percutaneous gastrostomy tube in the stomach. There is a catheter extending from the gastric body through the first portion the duodenum which presumably is a J arm extension of the gastrostomy tube. This tube terminates at the gastric antrum.  Gas in the porta hepatis measuring 19 mm at site of prior extraluminal gas collection. There is no evidence indication with the small bowel. No evidence of bowel obstruction. Contrast flows the entirety of the bowel to the colon. No extravasation of contrast  Vascular/Lymphatic: Abdominal aorta is normal caliber. There is no  retroperitoneal or periportal lymphadenopathy. No pelvic lymphadenopathy.  Reproductive: Leiomyomas uterus.  Ovaries are normal.  Musculoskeletal: No aggressive osseous lesion.  Other: No abscess in pelvis.  IMPRESSION: 1. Percutaneous drainage catheters has been withdrawn to the right abdominal wall. 2. Small extraluminal gas collection remains the porta hepatis. No clear communication small bowel. 3. J arm extension has been withdrawn withdrawn into the duodenum bulb / gastric antrum. 4. No evidence of bowel obstruction. 5. No evidence of leakage of oral contrast. 6. No evidence of pelvic abscess per   Electronically Signed   By: Genevive Bi M.D.   On: 09/10/2014 17:19   Ct Abdomen Pelvis Wo Contrast  08/22/2014   CLINICAL DATA:  Abdominal abscess. No intravenous contrast due to renal failure.  EXAM: CT ABDOMEN AND PELVIS WITHOUT CONTRAST  TECHNIQUE: Multidetector CT imaging of the abdomen and pelvis was performed following the standard protocol without IV contrast.  COMPARISON:  08/15/2014  FINDINGS: BODY WALL: Open laparotomy without fluid collection.  LOWER CHEST: Chronic triangular opacity in the posterior costophrenic sulcus on the left, atelectasis versus scarring  ABDOMEN/PELVIS:  Liver: No focal abnormality.  Biliary: High-density material within the gallbladder showing mild wall thickening which is chronic.  Pancreas: Unremarkable.  Spleen: Unremarkable.  Adrenals: Unremarkable.  Kidneys and ureters: No hydronephrosis or stone. Presumed 1  cm cyst from the interpolar left kidney  Bladder: Decompressed.  No pathologic findings.  Reproductive: Calcified and noncalcified uterine fibroids, better seen on previous enhanced imaging.  Bowel: Right upper quadrant drain related to perforated pyloric ulcer status post Cheree Ditto patch. Specially visible on coronal imaging, there is a gas channel through the pylorus measuring 6 to 9 mm in diameter. No bowel obstruction. Enteroenterostomy noted in the pelvis. Oral  contrast again seen in the colon. No notable colonic wall thickening in this patient with history of C difficile. Negative appendix. Percutaneous gastrojejunostomy tube is in good position.  Retroperitoneum: No mass or adenopathy.  Peritoneum: Known thick walled collection in the rectovaginal recess smaller than 08/15/2014 at 40 x 16 mm as compared to 51 x 32 mm. No new collection is identified. No free pneumoperitoneum.  Vascular: 3 cm fusiform infrarenal aortic aneurysm.  OSSEOUS: No acute abnormalities.  IMPRESSION: 1. Near the patient's pyloric Cheree Ditto patch is a transmural defect in the stomach wall, contiguous with the surgical drain. If repeat surgery is considered, oral contrast could confirm an open ulcer. 2. Decreased rectovaginal recess fluid collection, now 4 x 1.5 cm (previously 5 x 3 cm).   Electronically Signed   By: Marnee Spring M.D.   On: 08/22/2014 01:49   Ct Abdomen Pelvis Wo Contrast  08/15/2014   CLINICAL DATA:  Intra abdominal abscess.  EXAM: CT ABDOMEN AND PELVIS WITHOUT CONTRAST  TECHNIQUE: Multidetector CT imaging of the abdomen and pelvis was performed following the standard protocol without IV contrast.  COMPARISON:  CT scan of Aug 03, 2014.  FINDINGS: Severe degenerative disc disease is noted at L5-S1. Minimal subsegmental atelectasis is noted posteriorly in the left lung base.  No gallstones are noted. No focal abnormality is noted in the liver, spleen or pancreas on these unenhanced images. Adrenal glands appear normal. Stable exophytic cyst is seen arising from midpole of left kidney. No hydronephrosis or renal obstruction is noted. No renal or ureteral calculi are noted. 3 cm infrarenal abdominal aortic aneurysm is noted. Gastrojejunostomy tube is seen entering stomach percutaneously and with distal tip in the jejunum. JP surgical drain is seen entering right upper quadrant of abdomen underneath the liver with distal tip in the left upper quadrant. No significant fluid collection  is noted around the drain. Fluid collection is again identified and posterior cul-de-sac of pelvis measuring 5.1 x 3.2 cm. Calcified degenerating fibroid is noted in the uterus. Urinary bladder is unremarkable. There is no evidence of bowel obstruction. The appendix appears normal. No significant adenopathy is noted. Large midline surgical anterior abdominal wall incision is noted.  IMPRESSION: Stable 3 cm infrarenal abdominal aortic aneurysm.  No change in position of JP surgical drain with distal tip in the left upper quadrant of the abdomen.  Interval placement of percutaneous gastrojejunostomy tube with distal tip in the jejunum.  Grossly stable size and appearance of 5 cm fluid collection seen in posterior cul-de-sac of the pelvis.  Continued presence uterine fibroids.   Electronically Signed   By: Lupita Raider, M.D.   On: 08/15/2014 15:15   Dg Chest 1 View  08/13/2014   CLINICAL DATA:  Shortness of breath  EXAM: CHEST  1 VIEW  COMPARISON:  08/03/2014  FINDINGS: Moderately severe cardiac enlargement. Central line in unchanged position on the right and left side. Mild vascular congestion with no evidence of edema effusion or consolidation.  IMPRESSION: No acute findings   Electronically Signed   By: Edgar Frisk.D.  On: 08/13/2014 11:04   Dg Chest 2 View  09/01/2014   CLINICAL DATA:  Followup respiratory failure  EXAM: CHEST  2 VIEW  COMPARISON:  08/23/2014  FINDINGS: Moderate cardiac enlargement. Left IJ catheter is identified with tip in the SVC. There is a right-sided dialysis catheter with tips in the SVC. No pleural effusion or edema. No airspace consolidation.  IMPRESSION: 1. Lungs remain clear. 2. Catheters positioned as above.   Electronically Signed   By: Signa Kell M.D.   On: 09/01/2014 09:04   Ir Gastrostomy Tube Mod Sed  08/12/2014   CLINICAL DATA:  Perforated duodenal ulcer, post g patch with persistent leak. Percutaneous gastrojejunostomy as requested for gastric decompression  and small bowel enteral feeding support.  EXAM: PERC PLACEMENT GASTROSTOMY;  CONVERT G-TUBE TO G-JTUBE  FLUOROSCOPY TIME:  16 minutes 24 seconds, 1016.6 mGy  TECHNIQUE: The procedure, risks, benefits, and alternatives were explained to the patient. Questions regarding the procedure were encouraged and answered. The patient understands and consents to the procedure. As antibiotic prophylaxis, cefazolin 2 g was ordered pre-procedure and administered intravenously within one hour of incision. . A 5 French angiographic catheter was placed as orogastric tube. The upper abdomen was prepped with Betadine, draped in usual sterile fashion, and infiltrated locally with 1% lidocaine. 1 mg glucagon was administered intravenously to facilitate gastric distention and slow peristalsis. Stomach was insufflated using air through the orogastric tube. An 49 French sheath needle was advanced percutaneously into the gastric lumen under fluoroscopy. Gas could be aspirated and a small contrast injection confirmed intraluminal spread. The sheath was exchanged over a guidewire for a 9 [place] vascular sheath, through which the snare device was advanced and used to snare a guidewire passed through the orogastric tube. This was withdrawn, and the snare attached to the 20 French pull-through gastrostomy tube, which was advanced antegrade, positioned with the internal bumper securing the anterior gastric wall to the anterior abdominal wall. Small contrast injection confirms appropriate positioning. The external bumper was applied and the catheter was flushed.  A 5 French angiographic catheter was placed coaxially through the gastrostomy tube and used to direct an angled stiff glidewire across the pylorus, beyond the proximal duodenal perforation, and into the proximal jejunum just beyond the ligament of Treitz. Over this, a coaxial jejunostomy feeding adapter was advanced. Contrast injection confirmed patency and appropriate position of the  gastric and jejunal lumens. No extravasation. The lumens were flushed with saline and capped. The patient tolerated the procedure well.  COMPLICATIONS: COMPLICATIONS none  IMPRESSION: 1. Technically successful 20 French pull-through gastrostomy placement under fluoroscopy. 2. Technically successful coaxial jejunostomy feeding adapter placement to the ligament of Treitz.   Electronically Signed   By: Corlis Leak M.D.   On: 08/12/2014 16:12   Ir Catheter Tube Change  09/01/2014   CLINICAL DATA:  History of a duodenal fistula. Surgical drain recently fell out and request for drain replacement.  EXAM: REPLACEMENT OF PERCUTANEOUS DRAIN WITH FLUOROSCOPY  Physician: Rachelle Hora. Henn, MD  FLUOROSCOPY TIME:  3 minutes, 246 mGy  MEDICATIONS AND MEDICAL HISTORY: None  ANESTHESIA/SEDATION: Moderate sedation time: None  CONTRAST:  15 mL Omnipaque-300  PROCEDURE: The procedure was explained to the patient. The risks and benefits of the procedure were discussed and the patient's questions were addressed. Informed consent was obtained from the patient. The right upper abdomen was prepped and draped in sterile fashion. Maximal barrier sterile technique was utilized including caps, mask, sterile gowns, sterile gloves, sterile drape, hand  hygiene and skin antiseptic. A 5 French catheter was advanced through the old drain site. Contrast was injected as the catheter was advanced into the tissue. Contrast injection demonstrated a connection to the duodenum bulb and stomach. Catheter and wire preferentially went into the stomach via the duodenal bulb. A series of wires were used to advance a 12 French [employer] catheter into the abdomen. The tip of the catheter was cut in order to advance the catheter over a wire. The tip was placed adjacent to the duodenal fistula but not within the bowel. Catheter was sutured to the skin. Catheter was attached to gravity bag.  FINDINGS: Focal fistula connection between the drain tract and the  duodenal bulb. New 12 French catheter was placed within the fistula tract but outside of the bowel.  Estimated blood loss: Minimal  COMPLICATIONS: None  IMPRESSION: Successful replacement of the percutaneous drain. Demonstration of the duodenal bulb fistula. Drain was placed adjacent to the fistula opening but outside of the bowel.   Electronically Signed   By: Richarda Overlie M.D.   On: 09/01/2014 13:39   Ir Adele Schilder Tamsen Snider Convert Gastr-jej Per W/fl Mod Sed  08/12/2014   CLINICAL DATA:  Perforated duodenal ulcer, post g patch with persistent leak. Percutaneous gastrojejunostomy as requested for gastric decompression and small bowel enteral feeding support.  EXAM: PERC PLACEMENT GASTROSTOMY;  CONVERT G-TUBE TO G-JTUBE  FLUOROSCOPY TIME:  16 minutes 24 seconds, 1016.6 mGy  TECHNIQUE: The procedure, risks, benefits, and alternatives were explained to the patient. Questions regarding the procedure were encouraged and answered. The patient understands and consents to the procedure. As antibiotic prophylaxis, cefazolin 2 g was ordered pre-procedure and administered intravenously within one hour of incision. . A 5 French angiographic catheter was placed as orogastric tube. The upper abdomen was prepped with Betadine, draped in usual sterile fashion, and infiltrated locally with 1% lidocaine. 1 mg glucagon was administered intravenously to facilitate gastric distention and slow peristalsis. Stomach was insufflated using air through the orogastric tube. An 88 French sheath needle was advanced percutaneously into the gastric lumen under fluoroscopy. Gas could be aspirated and a small contrast injection confirmed intraluminal spread. The sheath was exchanged over a guidewire for a 9 [place] vascular sheath, through which the snare device was advanced and used to snare a guidewire passed through the orogastric tube. This was withdrawn, and the snare attached to the 20 French pull-through gastrostomy tube, which was advanced  antegrade, positioned with the internal bumper securing the anterior gastric wall to the anterior abdominal wall. Small contrast injection confirms appropriate positioning. The external bumper was applied and the catheter was flushed.  A 5 French angiographic catheter was placed coaxially through the gastrostomy tube and used to direct an angled stiff glidewire across the pylorus, beyond the proximal duodenal perforation, and into the proximal jejunum just beyond the ligament of Treitz. Over this, a coaxial jejunostomy feeding adapter was advanced. Contrast injection confirmed patency and appropriate position of the gastric and jejunal lumens. No extravasation. The lumens were flushed with saline and capped. The patient tolerated the procedure well.  COMPLICATIONS: COMPLICATIONS none  IMPRESSION: 1. Technically successful 20 French pull-through gastrostomy placement under fluoroscopy. 2. Technically successful coaxial jejunostomy feeding adapter placement to the ligament of Treitz.   Electronically Signed   By: Corlis Leak M.D.   On: 08/12/2014 16:12   Dg Sinus/fist Tube Chk-non Gi  09/07/2014   CLINICAL DATA:  Evaluate for persistence of fistula.  EXAM: ABSCESS INJECTION  TECHNIQUE: Under direct fluoroscopic guidance water-soluble contrast material was injected through the right-sided percutaneous drainage catheter.  CONTRAST:  12mL OMNIPAQUE IOHEXOL 300 MG/ML  SOLN  FLUOROSCOPY TIME:  Radiation Exposure Index (as provided by the fluoroscopic device):  If the device does not provide the exposure index:  Fluoroscopy Time (in minutes and seconds):  48 seconds  Number of Acquired Images:  6  COMPARISON:  07/27/2014  FINDINGS: Since the previous exam the right-sided percutaneous drainage catheter appears to have been pulled back. Injection of water-soluble contrast material through the catheter was performed. No extravasation of contrast material into the peritoneal cavity identified. The contrast material  preferentially flowed in a retrograde fashion around the catheter to the skin surface. A thin, short sinus tract was noted extending from the tip of the catheter. No opacification of duodenum noted.  IMPRESSION: 1. No evidence for free extravasation of contrast material in the intraperitoneal cavity in no evidence for communication with the duodenum. 2. The ingested contrast material preferentially flowed in a retrograde fashion towards the skin surface.   Electronically Signed   By: Signa Kell M.D.   On: 09/07/2014 15:05   Dg Chest Port 1 View  08/23/2014   CLINICAL DATA:  Left chest pain  EXAM: PORTABLE CHEST - 1 VIEW  COMPARISON:  08/18/2014  FINDINGS: Patient is rotated.  Lungs are essentially clear. No focal consolidation. No pleural effusion or pneumothorax.  Cardiomegaly.  Left IJ venous catheter terminates at the cavoatrial junction. Right IJ dual lumen dialysis catheter terminates in the lower SVC.  IMPRESSION: No evidence of acute cardiopulmonary disease.   Electronically Signed   By: Charline Bills M.D.   On: 08/23/2014 19:17   Dg Chest Port 1 View  08/18/2014   CLINICAL DATA:  Shortness of breath at rest.  Having hemodialysis.  EXAM: PORTABLE CHEST - 1 VIEW  COMPARISON:  08/15/2014.  FINDINGS: The cardiac silhouette remains mildly enlarged. Stable right jugular double-lumen catheter. Clear lungs. The pulmonary vasculature remains prominent. No pleural fluid. Minimal right shoulder degenerative changes.  IMPRESSION: Stable cardiomegaly and pulmonary vascular congestion.   Electronically Signed   By: Beckie Salts M.D.   On: 08/18/2014 09:47   Dg Chest Port 1 View  08/15/2014   CLINICAL DATA:  Shortness of breath.  EXAM: PORTABLE CHEST - 1 VIEW  COMPARISON:  08/13/2014 and multiple prior chest radiographs  FINDINGS: Cardiomegaly and pulmonary vascular congestion again noted.  A right IJ central venous catheter is noted with tips overlying the upper and mid SVC.  A left central venous  catheter is present with tip overlying the lower SVC.  There is no evidence of focal airspace disease, pulmonary edema, suspicious pulmonary nodule/mass, pleural effusion, or pneumothorax. No acute bony abnormalities are identified.  IMPRESSION: Cardiomegaly with pulmonary vascular congestion.   Electronically Signed   By: Harmon Pier M.D.   On: 08/15/2014 17:28   Dg Abd Portable 1v  08/23/2014   CLINICAL DATA:  JP drain, broken, initial encounter  EXAM: PORTABLE ABDOMEN - 1 VIEW  COMPARISON:  CT abdomen pelvis dated 08/21/2014  FINDINGS: A portion of the right flank is excluded from this single image.  The patient's right abdominal surgical drain is not visualized.  Gastrojejunostomy in satisfactory position.  IMPRESSION: The patient's right abdominal surgical drain is not visualized.  Gastrojejunostomy in satisfactory position.   Electronically Signed   By: Charline Bills M.D.   On: 08/23/2014 19:11   Ct Image Guided Fluid Drain By Catheter  08/17/2014   CLINICAL DATA:  Postoperative intra-abdominal abscess, subsequent encounter. Small pelvic fluid collection.  EXAM: CT-GUIDED DRAIN PLACEMENT IN PELVIC FLUID COLLECTION  Physician: Rachelle Hora. Lowella Dandy, MD  FLUOROSCOPY TIME:  None  MEDICATIONS: 50 mcg fentanyl  ANESTHESIA/SEDATION: Patient was monitored by a radiology nurse during the procedure.  PROCEDURE: Informed consent was obtained for drain placement. Patient was placed on her left side. Images through the pelvis were obtained. The right buttock was prepped and draped in sterile fashion. 1% lidocaine was used for a local anesthetic. An 18 gauge needle was directed into the pelvic fluid collection with CT guidance from a transgluteal approach. Thick yellow purulent fluid was aspirated. A stiff Amplatz wire was placed. The tract was dilated to accommodate a 10.2 [place] multipurpose drain. It was technically difficult to advance the drain into the collection due to the small size of the collection and the  patient's body habitus. Eventually, the drain was successfully placed within the collection. 5 mL of yellow purulent fluid was obtained. Catheter was sutured to the skin and attached to a suction bulb. Fluid was sent for culture.  FINDINGS: Small fluid collection just posterior to the uterus. A total of 5 mL of yellow purulent fluid was removed.  Estimated blood loss: Minimal  COMPLICATIONS: None  IMPRESSION: CT-guided placement of a drainage catheter in the small pelvic fluid collection.   Electronically Signed   By: Richarda Overlie M.D.   On: 08/17/2014 17:21        Subjective: Returned from OR for avf revision, overall feeling better, on 3liter oxygen, on gj tube feeds  Objective: Filed Vitals:   09/10/14 1136 09/10/14 1245 09/10/14 1251 09/10/14 1309  BP: 101/76 108/75 96/74 98/69   Pulse: 105 115 109 112  Temp: 97.5 F (36.4 C)   97.8 F (36.6 C)  TempSrc:    Oral  Resp: 17   20  Height:      Weight:      SpO2: 93%   97%    Intake/Output Summary (Last 24 hours) at 09/10/14 2044 Last data filed at 09/10/14 1907  Gross per 24 hour  Intake   1390 ml  Output   1456 ml  Net    -66 ml   Weight change: 2.275 kg (5 lb 0.2 oz) Exam:   General:  Pt is alert, follows commands appropriately, not in acute distress, chronically ill appearing,   HEENT: No icterus,  [place]/AT  Cardiovascular: RRR, S1/S2, no rubs, no gallops  Respiratory: basilar ronchi, intermittent wheezing, Good air movement.  Abdomen: Soft/+BS, non tender, non distended, no guarding, G tube clamped, j tube with TF, perc drain no output observed.   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, aaox3  Data Reviewed: Basic Metabolic Panel:  Recent Labs Lab 09/06/14 1230 09/08/14 0500 09/08/14 1100 09/09/14 0513 09/09/14 2120 09/10/14 0741  NA  --  130* 129* 133* 135 134*  K  --  4.1 4.2 3.7 3.6 4.0  CL   --  90* 89* 94* 96* 95*  CO2  --  23 23 26 25 23   GLUCOSE  --  82 78 105* 166* 108*  BUN  --  41* 42* 21* 29* 38*  CREATININE  --  5.29* 5.32* 3.58* 4.35* 4.67*  CALCIUM  --  8.1* 8.0* 8.0* 8.4* 8.0*  MG 1.8  --   --   --   --   --  PHOS  --   --  8.5*  --  7.7* 8.2*   Liver Function Tests:  Recent Labs Lab 09/08/14 1100 09/09/14 2120 09/10/14 0741  AST  --   --  16  ALT  --   --  9*  ALKPHOS  --   --  130*  BILITOT  --   --  0.8  PROT  --   --  6.8  ALBUMIN 2.1* 2.1* 2.1*   No results for input(s): LIPASE, AMYLASE in the last 168 hours. No results for input(s): AMMONIA in the last 168 hours. CBC:  Recent Labs Lab 09/05/14 0532 09/08/14 0500 09/09/14 0513 09/09/14 2120 09/10/14 0741  WBC 11.1* 9.7 9.2 8.8 9.8  HGB 9.2* 9.2* 9.3* 9.5* 8.8*  HCT 29.4* 28.7* 30.1* 30.3* 27.9*  MCV 92.7 90.8 92.9 93.2 92.7  PLT 253 368 376 395 378   Cardiac Enzymes: No results for input(s): CKTOTAL, CKMB, CKMBINDEX, TROPONINI in the last 168 hours. BNP: Invalid input(s): POCBNP CBG: No results for input(s): GLUCAP in the last 168 hours.  Recent Results (from the past 240 hour(s))  Surgical pcr screen     Status: None   Collection Time: 09/09/14  6:45 AM  Result Value Ref Range Status   MRSA, PCR NEGATIVE NEGATIVE Final   Staphylococcus aureus NEGATIVE NEGATIVE Final    Comment:        The Xpert SA Assay (FDA approved for NASAL specimens in patients over 10 years of age), is one component of a comprehensive surveillance program.  Test performance has been validated by [facility] for patients greater than or equal to 82 year old. It is not intended to diagnose infection nor to guide or monitor treatment.      Scheduled Meds: . ALPRAZolam  0.5 mg Oral Q T,Th,Sa-HD  . amiodarone  400 mg Oral BID  . antiseptic oral rinse  7 mL Mouth Rinse q12n4p  . aspirin  81 mg Oral Daily  . budesonide (PULMICORT) nebulizer solution  0.25 mg Nebulization BID  . calcitRIOL  0.5 mcg  Oral Q T,Th,Sat-1800  . chlorhexidine  15 mL Mouth Rinse BID  . darbepoetin (ARANESP) injection - DIALYSIS  200 mcg Intravenous Q Thu-HD  . feeding supplement (PRO-STAT SUGAR FREE 64)  30 mL Per Tube TID  . feeding supplement (RESOURCE BREEZE)  1 Container Oral TID BM  . feeding supplement (VITAL AF 1.2 CAL)  1,000 mL Per Tube Q24H  . ferric gluconate (FERRLECIT/NULECIT) IV  125 mg Intravenous Q T,Th,Sa-HD  . ipratropium  0.5 mg Nebulization TID  . levalbuterol  0.63 mg Nebulization TID  . levothyroxine  25 mcg Oral QAC breakfast  . metroNIDAZOLE  500 mg Oral 3 times per day  . multivitamin  1 tablet Oral QHS  . sodium chloride  10-40 mL Intracatheter Q12H   Continuous Infusions: . sodium chloride 10 mL/hr at 09/09/14 France Ravens, MD PhD [facility] Pager [phone]  If 7PM-7AM, please contact night-coverage [email or web address] Password [email or web address] 09/10/2014, 8:44 PM   LOS: 85 days

## 2014-09-10 NOTE — Progress Notes (Addendum)
[facility]  Subjective  - Doing well no numbness or loss of motor.   Objective 101/76 105 97.5 F (36.4 C) (Oral) 17 93%  Intake/Output Summary (Last 24 hours) at 09/10/14 1236 Last data filed at 09/10/14 1136  Gross per 24 hour  Intake   1610 ml  Output   1466 ml  Net    144 ml    Palpable thrill in left AV fistula incision dressing clean an dry. 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 Ivar Drape, [facility] 09/10/2014 12:36 PM --  Laboratory Lab Results:  Recent Labs  09/09/14 2120 09/10/14 0741  WBC 8.8 9.8  HGB 9.5* 8.8*  HCT 30.3* 27.9*  PLT 395 378   BMET  Recent Labs  09/09/14 2120 09/10/14 0741  NA 135 134*  K 3.6 4.0  CL 96* 95*  CO2 25 23  GLUCOSE 166* 108*  BUN 29* 38*  CREATININE 4.35* 4.67*  CALCIUM 8.4* 8.0*    COAG Lab Results  Component Value Date   INR 1.28 08/14/2014   INR 1.19 08/12/2014   INR 1.21 07/10/2014   No results found for: PTT

## 2014-09-11 ENCOUNTER — Inpatient Hospital Stay (HOSPITAL_COMMUNITY)
Admission: RE | Admit: 2014-09-11 | Discharge: 2014-10-02 | DRG: 981 | Disposition: A | Discharge status: Home Health | Payer: Medicaid Other | Source: Intra-hospital | Attending: Physical Medicine & Rehabilitation | Admitting: Physical Medicine & Rehabilitation

## 2014-09-11 DIAGNOSIS — I5032 Chronic diastolic (congestive) heart failure: Secondary | ICD-10-CM | POA: Diagnosis not present

## 2014-09-11 DIAGNOSIS — F419 Anxiety disorder, unspecified: Secondary | ICD-10-CM | POA: Diagnosis present

## 2014-09-11 DIAGNOSIS — D631 Anemia in chronic kidney disease: Secondary | ICD-10-CM | POA: Diagnosis present

## 2014-09-11 DIAGNOSIS — K5900 Constipation, unspecified: Secondary | ICD-10-CM | POA: Diagnosis present

## 2014-09-11 DIAGNOSIS — A047 Enterocolitis due to Clostridium difficile: Secondary | ICD-10-CM | POA: Diagnosis present

## 2014-09-11 DIAGNOSIS — J449 Chronic obstructive pulmonary disease, unspecified: Secondary | ICD-10-CM | POA: Diagnosis present

## 2014-09-11 DIAGNOSIS — J45909 Unspecified asthma, uncomplicated: Secondary | ICD-10-CM | POA: Diagnosis present

## 2014-09-11 DIAGNOSIS — E039 Hypothyroidism, unspecified: Secondary | ICD-10-CM | POA: Diagnosis present

## 2014-09-11 DIAGNOSIS — I719 Aortic aneurysm of unspecified site, without rupture: Secondary | ICD-10-CM | POA: Diagnosis present

## 2014-09-11 DIAGNOSIS — R0602 Shortness of breath: Secondary | ICD-10-CM

## 2014-09-11 DIAGNOSIS — Q211 Atrial septal defect: Secondary | ICD-10-CM

## 2014-09-11 DIAGNOSIS — I471 Supraventricular tachycardia: Secondary | ICD-10-CM | POA: Diagnosis present

## 2014-09-11 DIAGNOSIS — I69354 Hemiplegia and hemiparesis following cerebral infarction affecting left non-dominant side: Secondary | ICD-10-CM | POA: Diagnosis present

## 2014-09-11 DIAGNOSIS — N186 End stage renal disease: Secondary | ICD-10-CM | POA: Diagnosis present

## 2014-09-11 DIAGNOSIS — R109 Unspecified abdominal pain: Secondary | ICD-10-CM

## 2014-09-11 DIAGNOSIS — N2581 Secondary hyperparathyroidism of renal origin: Secondary | ICD-10-CM | POA: Diagnosis present

## 2014-09-11 DIAGNOSIS — K551 Chronic vascular disorders of intestine: Secondary | ICD-10-CM

## 2014-09-11 DIAGNOSIS — F17210 Nicotine dependence, cigarettes, uncomplicated: Secondary | ICD-10-CM | POA: Diagnosis present

## 2014-09-11 DIAGNOSIS — G4700 Insomnia, unspecified: Secondary | ICD-10-CM | POA: Diagnosis present

## 2014-09-11 DIAGNOSIS — I639 Cerebral infarction, unspecified: Secondary | ICD-10-CM

## 2014-09-11 DIAGNOSIS — G252 Other specified forms of tremor: Secondary | ICD-10-CM | POA: Diagnosis present

## 2014-09-11 DIAGNOSIS — K651 Peritoneal abscess: Secondary | ICD-10-CM

## 2014-09-11 DIAGNOSIS — Z9289 Personal history of other medical treatment: Secondary | ICD-10-CM

## 2014-09-11 DIAGNOSIS — E871 Hypo-osmolality and hyponatremia: Secondary | ICD-10-CM | POA: Diagnosis not present

## 2014-09-11 DIAGNOSIS — G8194 Hemiplegia, unspecified affecting left nondominant side: Secondary | ICD-10-CM

## 2014-09-11 DIAGNOSIS — M109 Gout, unspecified: Secondary | ICD-10-CM | POA: Diagnosis present

## 2014-09-11 DIAGNOSIS — R188 Other ascites: Secondary | ICD-10-CM | POA: Diagnosis present

## 2014-09-11 DIAGNOSIS — G8190 Hemiplegia, unspecified affecting unspecified side: Secondary | ICD-10-CM | POA: Diagnosis not present

## 2014-09-11 DIAGNOSIS — Z931 Gastrostomy status: Secondary | ICD-10-CM | POA: Diagnosis not present

## 2014-09-11 DIAGNOSIS — I63521 Cerebral infarction due to unspecified occlusion or stenosis of right anterior cerebral artery: Secondary | ICD-10-CM | POA: Diagnosis not present

## 2014-09-11 DIAGNOSIS — Z452 Encounter for adjustment and management of vascular access device: Secondary | ICD-10-CM

## 2014-09-11 DIAGNOSIS — F329 Major depressive disorder, single episode, unspecified: Secondary | ICD-10-CM | POA: Diagnosis present

## 2014-09-11 DIAGNOSIS — D72829 Elevated white blood cell count, unspecified: Secondary | ICD-10-CM

## 2014-09-11 DIAGNOSIS — Z95818 Presence of other cardiac implants and grafts: Secondary | ICD-10-CM

## 2014-09-11 DIAGNOSIS — A0472 Enterocolitis due to Clostridium difficile, not specified as recurrent: Secondary | ICD-10-CM | POA: Diagnosis present

## 2014-09-11 DIAGNOSIS — I120 Hypertensive chronic kidney disease with stage 5 chronic kidney disease or end stage renal disease: Secondary | ICD-10-CM | POA: Diagnosis present

## 2014-09-11 LAB — CLOSTRIDIUM DIFFICILE BY PCR: CDIFFPCR: NEGATIVE

## 2014-09-11 MED ORDER — CHLORHEXIDINE GLUCONATE 0.12 % MT SOLN
15.0000 mL | Freq: Two times a day (BID) | OROMUCOSAL | Status: DC
  Administered 2014-09-11 – 2014-10-01 (×26): 15 mL via OROMUCOSAL
  Filled 2014-09-11 (×44): qty 15

## 2014-09-11 MED ORDER — PREDNISONE 50 MG PO TABS: 50.0000 mg | ORAL_TABLET | Freq: Every day | ORAL | Status: AC

## 2014-09-11 MED ORDER — IPRATROPIUM BROMIDE 0.02 % IN SOLN: 0.5000 mg | Freq: Three times a day (TID) | RESPIRATORY_TRACT | Status: DC

## 2014-09-11 MED ORDER — RENA-VITE PO TABS
1.0000 | ORAL_TABLET | Freq: Every day | ORAL | Status: DC
  Administered 2014-09-11 – 2014-10-01 (×19): 1 via ORAL
  Filled 2014-09-11 (×23): qty 1

## 2014-09-11 MED ORDER — PREDNISONE 50 MG PO TABS
50.0000 mg | ORAL_TABLET | Freq: Every day | ORAL | Status: DC
  Filled 2014-09-11: qty 1

## 2014-09-11 MED ORDER — LEVALBUTEROL HCL 0.63 MG/3ML IN NEBU: 0.6300 mg | INHALATION_SOLUTION | Freq: Three times a day (TID) | RESPIRATORY_TRACT | Status: DC

## 2014-09-11 MED ORDER — LEVALBUTEROL HCL 0.63 MG/3ML IN NEBU
0.6300 mg | INHALATION_SOLUTION | Freq: Three times a day (TID) | RESPIRATORY_TRACT | Status: DC
  Administered 2014-09-11 – 2014-10-02 (×55): 0.63 mg via RESPIRATORY_TRACT
  Filled 2014-09-11 (×113): qty 3

## 2014-09-11 MED ORDER — DIPHENOXYLATE-ATROPINE 2.5-0.025 MG PO TABS
1.0000 | ORAL_TABLET | Freq: Four times a day (QID) | ORAL | Status: DC | PRN
  Administered 2014-09-19 – 2014-09-30 (×12): 1 via ORAL
  Filled 2014-09-11 (×13): qty 1

## 2014-09-11 MED ORDER — LEVALBUTEROL HCL 0.63 MG/3ML IN NEBU
0.6300 mg | INHALATION_SOLUTION | RESPIRATORY_TRACT | Status: DC | PRN
  Administered 2014-09-15: 0.63 mg via RESPIRATORY_TRACT
  Filled 2014-09-11: qty 3

## 2014-09-11 MED ORDER — MONTELUKAST SODIUM 10 MG PO TABS
10.0000 mg | ORAL_TABLET | Freq: Every day | ORAL | Status: DC
  Filled 2014-09-11: qty 1

## 2014-09-11 MED ORDER — BISACODYL 10 MG RE SUPP: 10.0000 mg | Freq: Every day | RECTAL | Status: DC | PRN

## 2014-09-11 MED ORDER — CAMPHOR-MENTHOL 0.5-0.5 % EX LOTN
TOPICAL_LOTION | CUTANEOUS | Status: DC | PRN
Start: 2014-09-11 — End: 2014-10-02
  Filled 2014-09-11: qty 222

## 2014-09-11 MED ORDER — CALCITRIOL 0.5 MCG PO CAPS: 0.5000 ug | ORAL_CAPSULE | ORAL | Status: DC

## 2014-09-11 MED ORDER — BOOST / RESOURCE BREEZE PO LIQD
1.0000 | Freq: Three times a day (TID) | ORAL | Status: DC
  Administered 2014-09-12 – 2014-09-14 (×4): 1 via ORAL

## 2014-09-11 MED ORDER — AMIODARONE HCL 200 MG PO TABS
400.0000 mg | ORAL_TABLET | Freq: Two times a day (BID) | ORAL | Status: DC
  Administered 2014-09-11 – 2014-09-27 (×31): 400 mg via ORAL
  Filled 2014-09-11 (×37): qty 2

## 2014-09-11 MED ORDER — OXYCODONE HCL 5 MG PO TABS
5.0000 mg | ORAL_TABLET | Freq: Four times a day (QID) | ORAL | Status: DC | PRN
Start: 2014-09-11 — End: 2014-10-02
  Administered 2014-09-11 – 2014-10-02 (×27): 5 mg via ORAL
  Filled 2014-09-11 (×29): qty 1

## 2014-09-11 MED ORDER — VITAL AF 1.2 CAL PO LIQD
1000.0000 mL | ORAL | Status: DC
  Administered 2014-09-11 – 2014-09-12 (×2): 1000 mL
  Filled 2014-09-11 (×3): qty 1000

## 2014-09-11 MED ORDER — METRONIDAZOLE 500 MG PO TABS
500.0000 mg | ORAL_TABLET | Freq: Three times a day (TID) | ORAL | Status: DC
  Administered 2014-09-11 – 2014-09-14 (×8): 500 mg via ORAL
  Filled 2014-09-11 (×11): qty 1

## 2014-09-11 MED ORDER — FAMOTIDINE 20 MG PO TABS
20.0000 mg | ORAL_TABLET | Freq: Every day | ORAL | Status: DC
  Filled 2014-09-11: qty 1

## 2014-09-11 MED ORDER — ONDANSETRON HCL 4 MG PO TABS
4.0000 mg | ORAL_TABLET | Freq: Four times a day (QID) | ORAL | Status: DC | PRN
  Administered 2014-09-23: 4 mg via ORAL
  Filled 2014-09-11 (×4): qty 1

## 2014-09-11 MED ORDER — SODIUM CHLORIDE 0.9 % IV SOLN: 125.0000 mg | INTRAVENOUS | Status: DC

## 2014-09-11 MED ORDER — FAMOTIDINE 20 MG PO TABS: 20.0000 mg | ORAL_TABLET | Freq: Every day | ORAL | Status: DC

## 2014-09-11 MED ORDER — ALPRAZOLAM 0.5 MG PO TABS
0.5000 mg | ORAL_TABLET | ORAL | Status: DC
  Administered 2014-09-12 – 2014-10-01 (×10): 0.5 mg via ORAL
  Filled 2014-09-11 (×11): qty 1

## 2014-09-11 MED ORDER — METHYLPREDNISOLONE SODIUM SUCC 125 MG IJ SOLR
60.0000 mg | INTRAMUSCULAR | Status: DC
  Administered 2014-09-11: 60 mg via INTRAVENOUS
  Filled 2014-09-11: qty 2

## 2014-09-11 MED ORDER — SORBITOL 70 % SOLN: 30.0000 mL | Freq: Every day | Status: DC | PRN

## 2014-09-11 MED ORDER — SODIUM CHLORIDE 0.9 % IJ SOLN
10.0000 mL | INTRAMUSCULAR | Status: DC | PRN
  Administered 2014-09-12 (×2): 10 mL
  Administered 2014-09-14: 20 mL
  Administered 2014-09-15: 10 mL
  Administered 2014-09-16: 20 mL
  Administered 2014-09-16 – 2014-09-17 (×2): 10 mL
  Administered 2014-09-17 – 2014-09-20 (×2): 20 mL
  Administered 2014-09-21 – 2014-09-22 (×5): 10 mL
  Administered 2014-09-23 (×2): 20 mL
  Administered 2014-09-24: 10 mL
  Administered 2014-09-25 – 2014-09-26 (×2): 20 mL
  Administered 2014-09-27 (×2): 10 mL
  Administered 2014-09-27: 20 mL
  Administered 2014-09-29: 10 mL
  Filled 2014-09-11 (×23): qty 40

## 2014-09-11 MED ORDER — PRO-STAT SUGAR FREE PO LIQD: 30.0000 mL | Freq: Three times a day (TID) | ORAL | Status: DC

## 2014-09-11 MED ORDER — MONTELUKAST SODIUM 10 MG PO TABS: 10.0000 mg | ORAL_TABLET | Freq: Every day | ORAL | Status: DC

## 2014-09-11 MED ORDER — LEVOTHYROXINE SODIUM 25 MCG PO TABS
25.0000 ug | ORAL_TABLET | Freq: Every day | ORAL | Status: DC
  Administered 2014-09-12 – 2014-10-02 (×20): 25 ug via ORAL
  Filled 2014-09-11 (×25): qty 1

## 2014-09-11 MED ORDER — BUDESONIDE 0.25 MG/2ML IN SUSP
0.2500 mg | Freq: Two times a day (BID) | RESPIRATORY_TRACT | Status: DC
  Administered 2014-09-11 – 2014-09-14 (×6): 0.25 mg via RESPIRATORY_TRACT
  Filled 2014-09-11 (×8): qty 2

## 2014-09-11 MED ORDER — ASPIRIN 81 MG PO CHEW
81.0000 mg | CHEWABLE_TABLET | Freq: Every day | ORAL | Status: DC
  Administered 2014-09-12 – 2014-10-02 (×18): 81 mg via ORAL
  Filled 2014-09-11 (×20): qty 1

## 2014-09-11 MED ORDER — CALCITRIOL 0.5 MCG PO CAPS
0.5000 ug | ORAL_CAPSULE | ORAL | Status: DC
  Administered 2014-09-12 – 2014-10-01 (×5): 0.5 ug via ORAL
  Filled 2014-09-11 (×12): qty 1

## 2014-09-11 MED ORDER — DARBEPOETIN ALFA 200 MCG/0.4ML IJ SOSY
200.0000 ug | PREFILLED_SYRINGE | INTRAMUSCULAR | Status: DC
  Administered 2014-09-24 – 2014-10-01 (×2): 200 ug via INTRAVENOUS
  Filled 2014-09-11 (×3): qty 0.4

## 2014-09-11 MED ORDER — IPRATROPIUM BROMIDE 0.02 % IN SOLN
0.5000 mg | Freq: Three times a day (TID) | RESPIRATORY_TRACT | Status: DC
  Administered 2014-09-11 – 2014-10-02 (×55): 0.5 mg via RESPIRATORY_TRACT
  Filled 2014-09-11 (×60): qty 2.5

## 2014-09-11 MED ORDER — METRONIDAZOLE 500 MG PO TABS: 500.0000 mg | ORAL_TABLET | Freq: Three times a day (TID) | ORAL | Status: AC

## 2014-09-11 MED ORDER — ONDANSETRON HCL 4 MG/2ML IJ SOLN: 4.0000 mg | Freq: Four times a day (QID) | INTRAMUSCULAR | Status: DC | PRN

## 2014-09-11 MED ORDER — SODIUM CHLORIDE 0.9 % IV SOLN
125.0000 mg | INTRAVENOUS | Status: AC
  Administered 2014-09-12 – 2014-09-19 (×4): 125 mg via INTRAVENOUS
  Filled 2014-09-11 (×8): qty 10

## 2014-09-11 MED ORDER — DIPHENHYDRAMINE-ZINC ACETATE 2-0.1 % EX CREA
TOPICAL_CREAM | Freq: Three times a day (TID) | CUTANEOUS | Status: DC | PRN
  Filled 2014-09-11: qty 28

## 2014-09-11 NOTE — H&P (View-Only) (Signed)
Physical Medicine and Rehabilitation Admission H&P    Chief Complaint  Patient presents with  . Shortness of Breath  . Asthma  . Cough  : HPI: [name] is a 59 y.o. right handed female with history of stage V chronic kidney disease status post fistula placement followed by nephrologist at [facility] Dr. Sheppard Coil, hypertension, tobacco abuse. Independent prior to admission living with her daughter. Presented 06/17/2014 to [facility] with progressive shortness of breath 2 weeks and lower extremity edema. By report patient has stopped taking her diuretics 2 weeks ago. Findings of elevated creatinine 5.5 baseline of 2.64. Also noted poor appetite related to symptoms of upper respiratory infection. Chest x-ray showed no infiltrate or failure. Renal ultrasound with no hydronephrosis. 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 Nephrology service follow-up placed on intravenous Lasix. Neurology consulted for left sided weakness. MRI of the brain 06/20/2014 shows multifocal areas of acute infarct throughout both hemispheres with the largest involving the right medial parasagittal frontal and posterior frontal cortex. MRA with no large vessel occlusion. Carotid Dopplers with no ICA stenosis. Echocardiogram with ejection fraction 65% and grade 1 diastolic dysfunction. Patient placed on full dose aspirin therapy. TEE performed showing normal LV function and no thrombus. Positive bubble study indicating a small PFO. Lower extremity Dopplers negative for DVT. Underwent placement of loop recorder 06/25/2014 per Dr. Caryl Comes. Patient with intermittent runs of PSVT placed on beta blocker. Renal function without change and hemodialysis initiated right internal jugular DIATEK catheter placed 06/27/2014 and later with placement of left arm AV fistula 09/09/2014. Developed acute abdominal pain 07/01/2014. CT scan  showed massive pneumoperitoneum and ascites and also pneumatosis of a short segment of the ileum. General surgery consulted underwent exploratory laparotomy evacuation of ascites with closure of perforated pyloric ulcer with omental patch and segmental resection of ileum 07/01/2014 per Dr. Dalbert Batman. Patient required full ventilatory support followed by critical care medicine. Wound care nurse consulted for need for VAC to abdominal wound. Placed on contact precautions for Clostridium difficile. Follow-up CT of abdomen showed no signs of abscess. Red rubber drain has been removed. Patient is currently nothing by mouth with interventional radiology placing gastrostomy tube 08/12/2014 and diet advanced to mechanical soft and still receiving tube feeds 12 hours. Therapies initiated slow overall progress. M.D. has requested physical medicine rehabilitation. Patient was admitted for a comprehensive rehabilitation program consult  Review of Systems  Constitutional: Negative for fever and chills.  Respiratory: Positive for cough.   Gastrointestinal: Positive for diarrhea.  Musculoskeletal: Negative for myalgias.  Neurological: Negative for headaches.   Review of Systems  Constitutional. Negative fever negative chills Skin. Negative rash Respiratory: Positive for shortness of breath on exertion.  Cardiovascular: Positive for leg swelling, palpitations.  Gastrointestinal: Positive for constipation, nausea, heartburn. Denies abdominal pain Musculoskeletal: Positive for myalgias, joint pain.  Psychiatric/Behavioral: The patient has insomnia, depression.  All other systems reviewed and are negative   Past Medical History  Diagnosis Date  . Asthma   . Hypertension   . Gout   . Arthritis   . Insomnia   . Chronic kidney disease   . Depression   . Renal insufficiency   . Aortic aneurysm without rupture     3cm by CT 08/03/14  . Paroxysmal SVT (supraventricular tachycardia)   . Cor pulmonale   . Stroke  06/2014  . PFO (patent foramen ovale)   . COPD (chronic obstructive pulmonary  disease)    Past Surgical History  Procedure Laterality Date  . Multiple tooth extractions    . Av fistula placement Left 01/28/2014    Procedure: ARTERIOVENOUS (AV) FISTULA CREATION;  Surgeon: Elam Dutch, MD;  Location: [place];  Service: Vascular;  Laterality: Left;  . Tee without cardioversion N/A 06/23/2014    Procedure: TRANSESOPHAGEAL ECHOCARDIOGRAM (TEE);  Surgeon: Lelon Perla, MD;  Location: [place];  Service: Cardiovascular;  Laterality: N/A;  . Insertion of dialysis catheter Right 06/27/2014    Procedure: INSERTION OF Right Internal Jugular DIATEK CATHETER;  Surgeon: Mal Misty, MD;  Location: [place];  Service: Vascular;  Laterality: Right;  . Laparotomy N/A 07/01/2014    Procedure: EXPLORATORY LAPAROTOMY WITH CLOSURE OF PERFORATED PYLORIC ULCER;  Surgeon: Fanny Skates, MD;  Location: [place];  Service: General;  Laterality: N/A;  . Bowel resection N/A 07/01/2014    Procedure: SMALL BOWEL RESECTION;  Surgeon: Fanny Skates, MD;  Location[facility];  Service: General;  Laterality: N/A;   Family History  Problem Relation Age of Onset  . Diabetes Mother   . Hypertension Mother   . Heart disease Mother   . Heart attack Mother   . Peripheral vascular disease Mother   . Diabetes Father   . Heart disease Father   . Hypertension Father   . Diabetes Sister   . Hypertension Sister   . Heart disease Sister     before age 34  . Heart attack Sister   . Peripheral vascular disease Sister   . Heart disease Brother   . Hyperlipidemia Daughter    Social History:  reports that she has been smoking Cigarettes.  She has a 10 pack-year smoking history. She has never used smokeless tobacco. She reports that she drinks alcohol. She reports that she does not use illicit drugs. Allergies: No Known Allergies Medications Prior to Admission  Medication Sig Dispense Refill  . allopurinol (ZYLOPRIM) 100 MG  tablet Take 100 mg by mouth 2 (two) times daily as needed (for gout).    . citalopram (CELEXA) 20 MG tablet Take 20 mg by mouth daily.    . furosemide (LASIX) 80 MG tablet Take 80 mg by mouth daily.    . hydrOXYzine (ATARAX/VISTARIL) 25 MG tablet Take 25 mg by mouth 3 (three) times daily as needed for anxiety.    Marland Kitchen oxyCODONE (ROXICODONE) 5 MG immediate release tablet Take 1 tablet (5 mg total) by mouth every 6 (six) hours as needed for severe pain. (Patient not taking: Reported on 06/17/2014) 15 tablet 0    Home: Home Living Family/patient expects to be discharged to:: Unsure Living Arrangements: Children Available Help at Discharge: Family Type of Home: Apartment Home Access: Stairs to enter Technical brewer of Steps: 12 Home Layout: Able to live on main level with bedroom/bathroom Home Equipment: None Additional Comments: tub shower, standard height toilets   Functional History: Prior Function Level of Independence: Independent  Functional Status:  Mobility: Bed Mobility   Bed Mobility: Supine to Sit     Supine to sit: HOB elevated;Min assist     General bed mobility comments: pt able to transfer from supine with HOB 30 to sitting EOB with only cues and use of rail but required min assist to scoot hips fully to EOB  Transfers Overall transfer level: Needs assistance   Transfers: Sit to/from Stand Sit to Stand: Min assist         General transfer comment: mod cues for anterior translation, safety  and sequence with left foot blocked x 3 trials  Ambulation/Gait Ambulation/Gait assistance: Min assist;+2 safety/equipment Ambulation Distance (Feet): 82 Feet Assistive device: Rolling walker (2 wheeled) Gait Pattern/deviations: Step-through pattern;Decreased stride length;Trunk flexed;Wide base of support   Gait velocity interpretation: Below normal speed for age/gender General Gait Details: Pt walked 62', 82', 16' with seated rest between each ambulation trial  with one less seated rest needed today. Music to assist with motivating pt and chair to follow for fatigue. Cues for posture, position in Rw and BOS      ADL: ADL Overall ADL's : Needs assistance/impaired Eating/Feeding: NPO Eating/Feeding Details (indicate cue type and reason): reports not feeling well and not eating lunch currently Grooming: Wash/dry hands, Wash/dry face, Set up, Sitting Grooming Details (indicate cue type and reason): min assist to sit EOB while doing simple grooming tasks. Pt stated she was very tired and wanting to lay down. Upper Body Bathing: Minimal assitance, Sitting Upper Body Bathing Details (indicate cue type and reason): encouragement to complete task Lower Body Bathing: Total assistance, Bed level Lower Body Bathing Details (indicate cue type and reason): pt participated minimally Upper Body Dressing : Moderate assistance, Sitting Lower Body Dressing: Total assistance, Bed level Lower Body Dressing Details (indicate cue type and reason): with use of stedy Toilet Transfer: Minimal assistance, +2 for physical assistance, Stand-pivot, BSC Toilet Transfer Details (indicate cue type and reason): Pt required assist to prevent Rt knee from buckling and and assist to advance LEs to pivot  Toileting- Clothing Manipulation and Hygiene: Total assistance Toileting - Clothing Manipulation Details (indicate cue type and reason): pt able to perform pericare in sitting. Max  A to stand with use of stedy Functional mobility during ADLs: Minimal assistance, +2 for physical assistance General ADL Comments: Pt sat EOB ~20 minutes to facilitate trunk control. Pt has good static sitting balance and promoted dynamic balance through reaching to apply lotion to LEs, weight shift without UE support, and trunk rotation. Pt participated in scapular mobilization to promote full ROM and functional UE use.   Cognition: Cognition Overall Cognitive Status: Within Functional Limits for  tasks assessed Orientation Level: Oriented X4 Cognition Arousal/Alertness: Awake/alert Behavior During Therapy: Flat affect Overall Cognitive Status: Within Functional Limits for tasks assessed Area of Impairment:  (mild attention deficit) Current Attention Level: Selective General Comments: Pt able to follow one and two step commands consistently. Cognition appears [facility] for basic tasks   Physical Exam: Blood pressure 100/58, pulse 100, temperature 98.2 F (36.8 C), temperature source Oral, resp. rate 17, height _0  (1.676 m), weight 96 kg (211 lb 10.3 oz), SpO2 100 %. Physical Exam  Gen: alert, no distress, obese HENT:  Poor dentition,  Eyes: EOM are normal.  Neck: Normal range of motion. Neck supple. No thyromegaly present.  Cardiovascular: Normal rate and regular rhythm. no murmurs Respiratory: Effort normal and breath sounds normal. No respiratory distress. occ rhonchi and upper airway sounds. Occasional non-productive cough. GI: Bowel sounds are normal.  Abdominal wound is dressed . G-tube site clean and dry  Neurological: She is alert.  She is oriented to person place date of birth.  Generalized weakness. LUE 3/5 deltoid, bicep, tricep. 3+/5 wrist/hand. RUE grossly 3+ to 4/5.  LE: 3 RHF, 2 LHF, 3+ RKE, 2 LKE, ankles grossly 3+ to 4-/5. Resting tremor in arms/legs less apparent today. Inconsistent sensory exam. Flat, cooperative. Attention, awareness, insight are improving. Psych: pleasant and up beat.   Results for orders placed or performed during the hospital encounter  of 06/17/14 (from the past 48 hour(s))  CBC     Status: Abnormal   Collection Time: 08/15/14 10:45 AM  Result Value Ref Range   WBC 14.4 (H) 4.0 - 10.5 K/uL   RBC 3.36 (L) 3.87 - 5.11 MIL/uL   Hemoglobin 9.2 (L) 12.0 - 15.0 g/dL   HCT 29.6 (L) 36.0 - 46.0 %   MCV 88.1 78.0 - 100.0 fL   MCH 27.4 26.0 - 34.0 pg   MCHC 31.1 30.0 - 36.0 g/dL   RDW 18.9 (H) 11.5 - 15.5 %   Platelets 276 150 - 400 K/uL    Glucose, capillary     Status: Abnormal   Collection Time: 08/15/14 12:18 PM  Result Value Ref Range   Glucose-Capillary 139 (H) 65 - 99 mg/dL   Comment 1 Notify RN    Comment 2 Document in Chart   Blood gas, arterial     Status: Abnormal   Collection Time: 08/15/14  5:03 PM  Result Value Ref Range   FIO2 1.00 %   Delivery systems NONREBREATHER    pH, Arterial 7.341 (L) 7.350 - 7.450   pCO2 arterial 48.4 (H) 35.0 - 45.0 mmHg   pO2, Arterial 69.7 (L) 80.0 - 100.0 mmHg   Bicarbonate 25.5 (H) 20.0 - 24.0 mEq/L   TCO2 27.0 0 - 100 mmol/L   Acid-Base Excess 0.4 0.0 - 2.0 mmol/L   O2 Saturation 93.5 %   Patient temperature 98.6    Collection site RIGHT RADIAL    Drawn by [id number]    Sample type ARTERIAL DRAW    Allens test (pass/fail) PASS PASS  Troponin I (q 6hr x 3)     Status: Abnormal   Collection Time: 08/15/14  6:01 PM  Result Value Ref Range   Troponin I 0.10 (H) <0.031 ng/mL    Comment:        PERSISTENTLY INCREASED TROPONIN VALUES IN THE RANGE OF 0.04-0.49 ng/mL CAN BE SEEN IN:       -UNSTABLE ANGINA       -CONGESTIVE HEART FAILURE       -MYOCARDITIS       -CHEST TRAUMA       -ARRYHTHMIAS       -LATE PRESENTING MYOCARDIAL INFARCTION       -COPD   CLINICAL FOLLOW-UP RECOMMENDED.   Troponin I (q 6hr x 3)     Status: Abnormal   Collection Time: 08/15/14  9:50 PM  Result Value Ref Range   Troponin I 0.14 (H) <0.031 ng/mL    Comment:        PERSISTENTLY INCREASED TROPONIN VALUES IN THE RANGE OF 0.04-0.49 ng/mL CAN BE SEEN IN:       -UNSTABLE ANGINA       -CONGESTIVE HEART FAILURE       -MYOCARDITIS       -CHEST TRAUMA       -ARRYHTHMIAS       -LATE PRESENTING MYOCARDIAL INFARCTION       -COPD   CLINICAL FOLLOW-UP RECOMMENDED.   Glucose, capillary     Status: Abnormal   Collection Time: 08/16/14 12:45 AM  Result Value Ref Range   Glucose-Capillary 153 (H) 65 - 99 mg/dL  Glucose, capillary     Status: None   Collection Time: 08/16/14  6:33 AM  Result Value  Ref Range   Glucose-Capillary 96 65 - 99 mg/dL  Troponin I (q 6hr x 3)     Status: Abnormal   Collection Time: 08/16/14  6:41 AM  Result Value Ref Range   Troponin I 0.10 (H) <0.031 ng/mL    Comment:        PERSISTENTLY INCREASED TROPONIN VALUES IN THE RANGE OF 0.04-0.49 ng/mL CAN BE SEEN IN:       -UNSTABLE ANGINA       -CONGESTIVE HEART FAILURE       -MYOCARDITIS       -CHEST TRAUMA       -ARRYHTHMIAS       -LATE PRESENTING MYOCARDIAL INFARCTION       -COPD   CLINICAL FOLLOW-UP RECOMMENDED.   CBC     Status: Abnormal   Collection Time: 08/16/14  6:41 AM  Result Value Ref Range   WBC 16.0 (H) 4.0 - 10.5 K/uL   RBC 3.12 (L) 3.87 - 5.11 MIL/uL   Hemoglobin 8.6 (L) 12.0 - 15.0 g/dL   HCT 28.2 (L) 36.0 - 46.0 %   MCV 90.4 78.0 - 100.0 fL   MCH 27.6 26.0 - 34.0 pg   MCHC 30.5 30.0 - 36.0 g/dL   RDW 19.4 (H) 11.5 - 15.5 %   Platelets 250 150 - 400 K/uL  Basic metabolic panel     Status: Abnormal   Collection Time: 08/16/14  6:41 AM  Result Value Ref Range   Sodium 132 (L) 135 - 145 mmol/L   Potassium 4.4 3.5 - 5.1 mmol/L    Comment: DELTA CHECK NOTED   Chloride 95 (L) 101 - 111 mmol/L   CO2 28 22 - 32 mmol/L   Glucose, Bld 127 (H) 65 - 99 mg/dL   BUN 45 (H) 6 - 20 mg/dL   Creatinine, Ser 3.59 (H) 0.44 - 1.00 mg/dL   Calcium 7.6 (L) 8.9 - 10.3 mg/dL   GFR calc non Af Amer 13 (L) >60 mL/min   GFR calc Af Amer 15 (L) >60 mL/min    Comment: (NOTE) The eGFR has been calculated using the CKD EPI equation. This calculation has not been validated in all clinical situations. eGFR's persistently <60 mL/min signify possible Chronic Kidney Disease.    Anion gap 9 5 - 15  Magnesium     Status: None   Collection Time: 08/16/14  6:41 AM  Result Value Ref Range   Magnesium 2.2 1.7 - 2.4 mg/dL  Phosphorus     Status: None   Collection Time: 08/16/14  6:41 AM  Result Value Ref Range   Phosphorus 4.0 2.5 - 4.6 mg/dL  Glucose, capillary     Status: Abnormal   Collection Time:  08/16/14  8:32 AM  Result Value Ref Range   Glucose-Capillary 176 (H) 65 - 99 mg/dL  Glucose, capillary     Status: Abnormal   Collection Time: 08/16/14 12:02 PM  Result Value Ref Range   Glucose-Capillary 155 (H) 65 - 99 mg/dL  Glucose, capillary     Status: Abnormal   Collection Time: 08/16/14  6:03 PM  Result Value Ref Range   Glucose-Capillary 120 (H) 65 - 99 mg/dL  Glucose, capillary     Status: Abnormal   Collection Time: 08/17/14 12:38 AM  Result Value Ref Range   Glucose-Capillary 156 (H) 65 - 99 mg/dL  Comprehensive metabolic panel     Status: Abnormal   Collection Time: 08/17/14  4:30 AM  Result Value Ref Range   Sodium 131 (L) 135 - 145 mmol/L   Potassium 4.2 3.5 - 5.1 mmol/L   Chloride 95 (L) 101 - 111 mmol/L   CO2 26  22 - 32 mmol/L   Glucose, Bld 68 65 - 99 mg/dL   BUN 71 (H) 6 - 20 mg/dL   Creatinine, Ser 4.80 (H) 0.44 - 1.00 mg/dL   Calcium 7.9 (L) 8.9 - 10.3 mg/dL   Total Protein 6.3 (L) 6.5 - 8.1 g/dL   Albumin 1.6 (L) 3.5 - 5.0 g/dL   AST 29 15 - 41 U/L   ALT 9 (L) 14 - 54 U/L   Alkaline Phosphatase 162 (H) 38 - 126 U/L   Total Bilirubin 1.1 0.3 - 1.2 mg/dL   GFR calc non Af Amer 9 (L) >60 mL/min   GFR calc Af Amer 11 (L) >60 mL/min    Comment: (NOTE) The eGFR has been calculated using the CKD EPI equation. This calculation has not been validated in all clinical situations. eGFR's persistently <60 mL/min signify possible Chronic Kidney Disease.    Anion gap 10 5 - 15  Magnesium     Status: None   Collection Time: 08/17/14  4:30 AM  Result Value Ref Range   Magnesium 2.3 1.7 - 2.4 mg/dL  Phosphorus     Status: Abnormal   Collection Time: 08/17/14  4:30 AM  Result Value Ref Range   Phosphorus 6.2 (H) 2.5 - 4.6 mg/dL  CBC     Status: Abnormal   Collection Time: 08/17/14  4:30 AM  Result Value Ref Range   WBC 9.8 4.0 - 10.5 K/uL   RBC 4.01 3.87 - 5.11 MIL/uL   Hemoglobin 11.3 (L) 12.0 - 15.0 g/dL    Comment: REPEATED TO VERIFY   HCT 36.3 36.0 -  46.0 %   MCV 90.5 78.0 - 100.0 fL   MCH 28.2 26.0 - 34.0 pg   MCHC 31.1 30.0 - 36.0 g/dL   RDW 19.3 (H) 11.5 - 15.5 %   Platelets 203 150 - 400 K/uL  Differential     Status: Abnormal   Collection Time: 08/17/14  4:30 AM  Result Value Ref Range   Neutrophils Relative % 74 43 - 77 %   Neutro Abs 7.3 1.7 - 7.7 K/uL   Lymphocytes Relative 10 (L) 12 - 46 %   Lymphs Abs 1.0 0.7 - 4.0 K/uL   Monocytes Relative 13 (H) 3 - 12 %   Monocytes Absolute 1.3 (H) 0.1 - 1.0 K/uL   Eosinophils Relative 3 0 - 5 %   Eosinophils Absolute 0.3 0.0 - 0.7 K/uL   Basophils Relative 0 0 - 1 %   Basophils Absolute 0.0 0.0 - 0.1 K/uL  Prealbumin     Status: Abnormal   Collection Time: 08/17/14  4:30 AM  Result Value Ref Range   Prealbumin 7.9 (L) 18 - 38 mg/dL  Triglycerides     Status: None   Collection Time: 08/17/14  4:30 AM  Result Value Ref Range   Triglycerides 142 <150 mg/dL  Glucose, capillary     Status: None   Collection Time: 08/17/14  6:09 AM  Result Value Ref Range   Glucose-Capillary 96 65 - 99 mg/dL   Ct Abdomen Pelvis Wo Contrast  08/15/2014   CLINICAL DATA:  Intra abdominal abscess.  EXAM: CT ABDOMEN AND PELVIS WITHOUT CONTRAST  TECHNIQUE: Multidetector CT imaging of the abdomen and pelvis was performed following the standard protocol without IV contrast.  COMPARISON:  CT scan of Aug 03, 2014.  FINDINGS: Severe degenerative disc disease is noted at L5-S1. Minimal subsegmental atelectasis is noted posteriorly in the left lung base.  No  gallstones are noted. No focal abnormality is noted in the liver, spleen or pancreas on these unenhanced images. Adrenal glands appear normal. Stable exophytic cyst is seen arising from midpole of left kidney. No hydronephrosis or renal obstruction is noted. No renal or ureteral calculi are noted. 3 cm infrarenal abdominal aortic aneurysm is noted. Gastrojejunostomy tube is seen entering stomach percutaneously and with distal tip in the jejunum. JP surgical drain  is seen entering right upper quadrant of abdomen underneath the liver with distal tip in the left upper quadrant. No significant fluid collection is noted around the drain. Fluid collection is again identified and posterior cul-de-sac of pelvis measuring 5.1 x 3.2 cm. Calcified degenerating fibroid is noted in the uterus. Urinary bladder is unremarkable. There is no evidence of bowel obstruction. The appendix appears normal. No significant adenopathy is noted. Large midline surgical anterior abdominal wall incision is noted.  IMPRESSION: Stable 3 cm infrarenal abdominal aortic aneurysm.  No change in position of JP surgical drain with distal tip in the left upper quadrant of the abdomen.  Interval placement of percutaneous gastrojejunostomy tube with distal tip in the jejunum.  Grossly stable size and appearance of 5 cm fluid collection seen in posterior cul-de-sac of the pelvis.  Continued presence uterine fibroids.   Electronically Signed   By: Marijo Conception, M.D.   On: 08/15/2014 15:15   Dg Chest Port 1 View  08/15/2014   CLINICAL DATA:  Shortness of breath.  EXAM: PORTABLE CHEST - 1 VIEW  COMPARISON:  08/13/2014 and multiple prior chest radiographs  FINDINGS: Cardiomegaly and pulmonary vascular congestion again noted.  A right IJ central venous catheter is noted with tips overlying the upper and mid SVC.  A left central venous catheter is present with tip overlying the lower SVC.  There is no evidence of focal airspace disease, pulmonary edema, suspicious pulmonary nodule/mass, pleural effusion, or pneumothorax. No acute bony abnormalities are identified.  IMPRESSION: Cardiomegaly with pulmonary vascular congestion.   Electronically Signed   By: Margarette Canada M.D.   On: 08/15/2014 17:28       Medical Problem List and Plan: 1. 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 status post loop recorder/respiratory failure/multi-medical 2.  DVT Prophylaxis/Anticoagulation: SCD.Monitor for any signs of  DVT. Venous Doppler studies negative 3. Pain Management: Oxycodone as needed. 4. End-stage renal disease. Left arm AV fistula 09/09/2014. Hemodialysis as per renal services 5. Neuropsych: This patient is capable of making decisions on her own behalf. 6. Skin/Wound Care: Routine skin checks/dressing changes as advised to abdominal wound with wet-to-dry 7. Fluids/Electrolytes/Nutrition: Routine nose with follow-up chemistries 8. C. difficile. Patient remains on Flagyl and will discuss duration. 9. Hypertension/intermittent bouts of PSVT. Lopressor 12.5 mg twice a day. 10. Massive pneumoperitoneum and ascites. Status post exploratory laparotomy and closure of perforated pyloric ulcer 07/01/2014. Follow-up Gen. surgery. Dressing changes as advised to abdominal wound 11. Decreased nutritional storage. Advance diet as tolerated. Status post gastrostomy tube 08/12/2014 per interventional radiology 12. Hypothyroidism. Synthroid 13. Mood/anxiety. Ativan as needed    Post Admission Physician Evaluation: 1. Functional deficits secondary  to right ACA infarct. 2. Patient is admitted to receive collaborative, interdisciplinary care between the physiatrist, rehab nursing staff, and therapy team. 3. Patient's level of medical complexity and substantial therapy needs in context of that medical necessity cannot be provided at a lesser intensity of care such as a SNF. 4. Patient has experienced substantial functional loss from his/her baseline which was documented above under the "Functional  History" and "Functional Status" headings.  Judging by the patient's diagnosis, physical exam, and functional history, the patient has potential for functional progress which will result in measurable gains while on inpatient rehab.  These gains will be of substantial and practical use upon discharge  in facilitating mobility and self-care at the household level. 5. Physiatrist will provide 24 hour management of medical needs  as well as oversight of the therapy plan/treatment and provide guidance as appropriate regarding the interaction of the two. 6. 24 hour rehab nursing will assist with bladder management, bowel management, safety, skin/wound care, disease management, medication administration, pain management and patient education  and help integrate therapy concepts, techniques,education, etc. 7. PT will assess and treat for/with: Lower extremity strength, range of motion, stamina, balance, functional mobility, safety, adaptive techniques and equipment, NMR, activity tolerance, family education, community reintegration.   Goals are: mod I to supervision. 8. OT will assess and treat for/with: ADL's, functional mobility, safety, upper extremity strength, adaptive techniques and equipment, NMR, visual-perceptual awareness, ego support, community reintegration.   Goals are: mod I to supervision. Therapy may not yet proceed with showering this patient. 9. SLP will assess and treat for/with: n/a.  Goals are: n/a. 10. Case Management and Social Worker will assess and treat for psychological issues and discharge planning. 11. Team conference will be held weekly to assess progress toward goals and to determine barriers to discharge. 12. Patient will receive at least 3 hours of therapy per day at least 5 days per week. 13. ELOS: 7-11 days       14. Prognosis:  excellent     Meredith Staggers, MD, [facility] 09/11/2014   08/17/2014

## 2014-09-11 NOTE — Progress Notes (Signed)
Subjective:  Siting in chair, tolerated dialysis in recliner yesterday, but continues to have sudden episodes of anxiety during treatments; frequent uncontrolled diarrhea, including during yesterday's dialysis . Recognizes prob with anxiety  Objective: Vital signs in last 24 hours: Temp:  [97.5 F (36.4 C)-98.3 F (36.8 C)] 97.6 F (36.4 C) (06/10 0838) Pulse Rate:  [101-120] 120 (06/10 1042) Resp:  [16-20] 18 (06/10 0838) BP: (92-114)/(55-85) 102/55 mmHg (06/10 0838) SpO2:  [93 %-100 %] 94 % (06/10 1042) Weight change:   Intake/Output from previous day: 06/09 0701 - 06/10 0700 In: 862 [P.O.:852; I.V.:10] Out: 1456  Intake/Output this shift: Total I/O In: 1140.3 [P.O.:270; NG/GT:870.3] Out: 0   Lab Results:  Recent Labs  09/09/14 2120 09/10/14 0741  WBC 8.8 9.8  HGB 9.5* 8.8*  HCT 30.3* 27.9*  PLT 395 378   BMET:  Recent Labs  09/09/14 2120 09/10/14 0741  NA 135 134*  K 3.6 4.0  CL 96* 95*  CO2 25 23  GLUCOSE 166* 108*  BUN 29* 38*  CREATININE 4.35* 4.67*  CALCIUM 8.4* 8.0*  ALBUMIN 2.1* 2.1*    Recent Labs  09/08/14 1100  PTH 346*   Iron Studies:  Recent Labs  09/08/14 1100  IRON 37  TIBC 160*    Studies/Results: Ct Abdomen Pelvis Wo Contrast  09/10/2014   CLINICAL DATA:  Follow-up small bowel fistula  EXAM: CT ABDOMEN AND PELVIS WITHOUT CONTRAST  TECHNIQUE: Multidetector CT imaging of the abdomen and pelvis was performed following the standard protocol without IV contrast.  COMPARISON:  CT 08/21/2014, 08/03/2014  FINDINGS: Lower chest: Mild reticular nodular pattern in the left lung base is increased. Findings could represent resolving pneumonia or aspiration pneumonitis.  Hepatobiliary: There is no intrahepatic biliary duct dilatation. No pneumobilia. Percutaneous drainage catheter extending to the capsule of the right hepatic lobe at the chest wall. This is been retracted from the region of the porta hepatis on comparison CT.  High-density fluid in  the gallbladder.  Pancreas: Pancreas is normal. No ductal dilatation. No pancreatic inflammation.  Spleen: Normal spleen  Adrenals/urinary tract: Adrenal glands and kidneys are normal. The ureters and bladder normal.  Stomach/Bowel: The percutaneous gastrostomy tube in the stomach. There is a catheter extending from the gastric body through the first portion the duodenum which presumably is a J arm extension of the gastrostomy tube. This tube terminates at the gastric antrum.  Gas in the porta hepatis measuring 19 mm at site of prior extraluminal gas collection. There is no evidence indication with the small bowel. No evidence of bowel obstruction. Contrast flows the entirety of the bowel to the colon. No extravasation of contrast  Vascular/Lymphatic: Abdominal aorta is normal caliber. There is no retroperitoneal or periportal lymphadenopathy. No pelvic lymphadenopathy.  Reproductive: Leiomyomas uterus.  Ovaries are normal.  Musculoskeletal: No aggressive osseous lesion.  Other: No abscess in pelvis.  IMPRESSION: 1. Percutaneous drainage catheters has been withdrawn to the right abdominal wall. 2. Small extraluminal gas collection remains the porta hepatis. No clear communication small bowel. 3. J arm extension has been withdrawn withdrawn into the duodenum bulb / gastric antrum. 4. No evidence of bowel obstruction. 5. No evidence of leakage of oral contrast. 6. No evidence of pelvic abscess per   Electronically Signed   By: Genevive Bi M.D.   On: 09/10/2014 17:19    EXAM: General appearance:  Alert, in no apparent distress Resp: CTA without rales, rhonchi, decreased bs, expir wheezes Cardio:  RRR with Gr II/VI systolic  murmur, no rub GI:  Binders, PEG, RUQ drain Extremities: No LE edema Access:  R IJ catheter, AVF @ LUA with + bruit  Assessment/Plan: 1. Perforated pyloric ulcer / abdominal abscess - s/prepair with partial ileum resection 3/30, post-op ileus, now with enterocutaneous fistula, per  surgery. Resolving fistula, no abscess on CT, cath to come out 2. ESRD - new start to HD, on TTS, using R IJ catheter; AVF placed 01/28/14 by Dr. Darrick Penna, s/p revision 6/8 per Dr. Arbie Cookey. Hold off use for now 3. Hypotension/Volume - BP 102/55, no meds; wt 99 kg s/p net UF 1.5 L yesterday. Has some periph vol 4. Anemia - Hgb 8.8, Aranesp 200 mcg on Thurs, started Fe 6/9. 5. Sec HPT - Ca 8 (9.5 corrected), P 8.2; Calcitriol 0.5 mcg, no binder. 6. Nutrition - Alb 2.1, soft diet, vitamin. TF 7. C diff colitis - now negative. 8. Anxiety - Xanax pre-HD. Suspect may need psych input 9. CVA   LOS: 86 days   LYLES,CHARLES 09/11/2014,10:52 AM I have seen and examined this patient and agree with the plan of care seen, eval, examined,counseled patient. .  Yosiel Thieme L 09/11/2014, 3:51 PM

## 2014-09-11 NOTE — Progress Notes (Signed)
Physical Therapy Treatment Patient Details Name: [name] MRN: [id number] DOB: [birth year] Today's Date: 09/11/2014    History of Present Illness [name] is a 59 y.o. female with a history of CKD and hypertension came to the [facility] ED on 3/16 complaining of shortness of breath and generalized weakness x 2 weeks. Admitted with acute hypoxemic respiratory failure. During hospital stay noted to have weakness of her left side. 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  She developed septic shock 4/1 with pressors weaned off 4/3.  Began CRRT and pressors restarted 4/4; Developed pelvic abcess 4/7 with pelvic drain placed 4/8.  weaned off pressors 4/9.  Runs of SVT 4/5 and 4/11. Prolonged hospital course with hypoxemia and hypotension. Right abcominal drain accidentally pulled 5/30 and replaced 5/31. Pt with AV fistula sx on 6/8 and right abdominal drain pulled 6/9    PT Comments    Pt moving well despite increased diarrhea and frustration with that. Pt remains positive and very willing to mobilize with increased bed mobility today and increased single gait trial to 30' today. Pt fatigued end of session and ready to eat watermelon. Pt encouraged to continue OOB and HEP. Will follow.   Follow Up Recommendations  CIR;Supervision/Assistance - 24 hour     Equipment Recommendations       Recommendations for Other Services       Precautions / Restrictions Precautions Precautions: Fall Precaution Comments: G tube, abdominal binder, abdominal wound Other Brace/Splint: abdominal binder    Mobility  Bed Mobility Overal bed mobility: Needs Assistance Bed Mobility: Rolling;Sit to Supine;Sidelying to Sit Rolling: Supervision Sidelying to sit: Min guard Supine to sit: Supervision;HOB elevated Sit to supine: Min assist   General bed mobility comments: cues for  sequence with HOB 20degrees and increased time but pt able to fully transfer to EOB without assist today  Transfers Overall transfer level: Needs assistance     Sit to Stand: Mod assist         General transfer comment: mod assist from low surface, min assist from higher surface x 2 trials. cues for hand placement and anterior translation  Ambulation/Gait Ambulation/Gait assistance: Min assist Ambulation Distance (Feet): 88 Feet Assistive device: Rolling walker (2 wheeled) Gait Pattern/deviations: Step-through pattern;Decreased stride length;Trunk flexed;Wide base of support   Gait velocity interpretation: Below normal speed for age/gender General Gait Details: Pt walked 56' then 9' with RW and seated rest. Cues for posture and position in RW   Stairs            Wheelchair Mobility    Modified Rankin (Stroke Patients Only)       Balance Overall balance assessment: Needs assistance   Sitting balance-Leahy Scale: Good       Standing balance-Leahy Scale: Poor                      Cognition Arousal/Alertness: Awake/alert Behavior During Therapy: WFL for tasks assessed/performed Overall Cognitive Status: Within Functional Limits for tasks assessed                 General Comments: Pt tearful, grateful for her progress and R drain removal today.    Exercises      General Comments        Pertinent Vitals/Pain Pain Assessment: No/denies pain  HR 120 with gait RA at rest 93% 94%  on 2L with gait    Home Living                      Prior Function            PT Goals (current goals can now be found in the care plan section) Acute Rehab PT Goals Patient Stated Goal: to get back to normal Progress towards PT goals: Progressing toward goals    Frequency       PT Plan Current plan remains appropriate    Co-evaluation             End of Session Equipment Utilized During Treatment: Oxygen Activity Tolerance: Patient  tolerated treatment well Patient left: in chair;with call bell/phone within reach;with nursing/sitter in room     Time: 7741-4239 PT Time Calculation (min) (ACUTE ONLY): 43 min  Charges:  $Gait Training: 8-22 mins $Therapeutic Activity: 23-37 mins                    G CodesDelorse Lek Oct 06, 2014, 10:45 AM Delaney Meigs, PT [phone]

## 2014-09-11 NOTE — Progress Notes (Signed)
[name], female   DOB: [birth year], 59 y.o.   MRN: [id number]     [facility]      [address] [place], [place] [address]    Phone: [phone] FAX: [phone]     Subjective: Diarrhea started this morning.  Appetite is fair.  Objective:  Vital signs:  Filed Vitals:   09/10/14 2100 09/11/14 0531 09/11/14 0733 09/11/14 0838  BP: 92/59 107/73  102/55  Pulse: 101 104  103  Temp: 98.3 F (36.8 C) 97.9 F (36.6 C)  97.6 F (36.4 C)  TempSrc: Oral Oral  Oral  Resp: $Remo'19 16  18  'eVjiI$ Height:      Weight:      SpO2: 100% 98% 97% 96%    Last BM Date: 09/11/14  Intake/Output   Yesterday:  06/09 0701 - 06/10 0700 In: 862 [P.O.:852; I.V.:10] Out: 1456  This shift:  Total I/O In: 120 [P.O.:120] Out: 0    Physical Exam: General: Pt awake/alert/oriented x4 in no acute distress  Abdomen: Soft.  Nondistended.  Non tender.  Midline wound is open, fascia is intact, fibrinous tissue at the base of the wound.  No evidence of peritonitis.  No incarcerated hernias.    Problem List:   Principal Problem:   Acute respiratory failure with hypoxia Active Problems:   Cerebral thrombosis with cerebral infarction   Hypertension   ESRD needing dialysis   Obesity (BMI 30-39.9)   Depression   Left renal mass   Chronic diastolic heart failure   H2C9 influenza   Tobacco use disorder   Renal failure (ARF), acute on chronic   Hyperlipidemia   PSVT (paroxysmal supraventricular tachycardia)   SOB (shortness of breath)   Perforated gastric ulcer   Abnormal TSH   History of intermediate V/Q scan   HIT (heparin-induced thrombocytopenia)   Fistula   Abdominal pain   Palliative care encounter   ESRD (end stage renal disease)   SVT (supraventricular tachycardia)   Right heart failure   Duodenal anastomotic leak   Protein calorie malnutrition   Itching   Aortic aneurysm   Protein-calorie malnutrition   Sepsis    Duodenal fistula    Results:   Labs: Results for orders placed or performed during the hospital encounter of 06/17/14 (from the past 48 hour(s))  Renal function panel     Status: Abnormal   Collection Time: 09/09/14  9:20 PM  Result Value Ref Range   Sodium 135 135 - 145 mmol/L   Potassium 3.6 3.5 - 5.1 mmol/L   Chloride 96 (L) 101 - 111 mmol/L   CO2 25 22 - 32 mmol/L   Glucose, Bld 166 (H) 65 - 99 mg/dL   BUN 29 (H) 6 - 20 mg/dL   Creatinine, Ser 4.35 (H) 0.44 - 1.00 mg/dL   Calcium 8.4 (L) 8.9 - 10.3 mg/dL   Phosphorus 7.7 (H) 2.5 - 4.6 mg/dL   Albumin 2.1 (L) 3.5 - 5.0 g/dL   GFR calc non Af Amer 10 (L) >60 mL/min   GFR calc Af Amer 12 (L) >60 mL/min    Comment: (NOTE) The eGFR has been calculated using the CKD EPI equation. This calculation has not been validated in all clinical situations. eGFR's persistently <60 mL/min signify possible Chronic Kidney Disease.    Anion gap 14 5 - 15  CBC     Status: Abnormal   Collection Time: 09/09/14  9:20 PM  Result Value Ref Range  WBC 8.8 4.0 - 10.5 K/uL   RBC 3.25 (L) 3.87 - 5.11 MIL/uL   Hemoglobin 9.5 (L) 12.0 - 15.0 g/dL   HCT 30.3 (L) 36.0 - 46.0 %   MCV 93.2 78.0 - 100.0 fL   MCH 29.2 26.0 - 34.0 pg   MCHC 31.4 30.0 - 36.0 g/dL   RDW 20.3 (H) 11.5 - 15.5 %   Platelets 395 150 - 400 K/uL  CBC     Status: Abnormal   Collection Time: 09/10/14  7:41 AM  Result Value Ref Range   WBC 9.8 4.0 - 10.5 K/uL   RBC 3.01 (L) 3.87 - 5.11 MIL/uL   Hemoglobin 8.8 (L) 12.0 - 15.0 g/dL   HCT 27.9 (L) 36.0 - 46.0 %   MCV 92.7 78.0 - 100.0 fL   MCH 29.2 26.0 - 34.0 pg   MCHC 31.5 30.0 - 36.0 g/dL   RDW 20.2 (H) 11.5 - 15.5 %   Platelets 378 150 - 400 K/uL  Comprehensive metabolic panel     Status: Abnormal   Collection Time: 09/10/14  7:41 AM  Result Value Ref Range   Sodium 134 (L) 135 - 145 mmol/L   Potassium 4.0 3.5 - 5.1 mmol/L   Chloride 95 (L) 101 - 111 mmol/L   CO2 23 22 - 32 mmol/L   Glucose, Bld 108 (H) 65 - 99 mg/dL    BUN 38 (H) 6 - 20 mg/dL   Creatinine, Ser 4.67 (H) 0.44 - 1.00 mg/dL   Calcium 8.0 (L) 8.9 - 10.3 mg/dL   Total Protein 6.8 6.5 - 8.1 g/dL   Albumin 2.1 (L) 3.5 - 5.0 g/dL   AST 16 15 - 41 U/L   ALT 9 (L) 14 - 54 U/L   Alkaline Phosphatase 130 (H) 38 - 126 U/L   Total Bilirubin 0.8 0.3 - 1.2 mg/dL   GFR calc non Af Amer 9 (L) >60 mL/min   GFR calc Af Amer 11 (L) >60 mL/min    Comment: (NOTE) The eGFR has been calculated using the CKD EPI equation. This calculation has not been validated in all clinical situations. eGFR's persistently <60 mL/min signify possible Chronic Kidney Disease.    Anion gap 16 (H) 5 - 15  Phosphorus     Status: Abnormal   Collection Time: 09/10/14  7:41 AM  Result Value Ref Range   Phosphorus 8.2 (H) 2.5 - 4.6 mg/dL    Imaging / Studies: Ct Abdomen Pelvis Wo Contrast  09/10/2014   CLINICAL DATA:  Follow-up small bowel fistula  EXAM: CT ABDOMEN AND PELVIS WITHOUT CONTRAST  TECHNIQUE: Multidetector CT imaging of the abdomen and pelvis was performed following the standard protocol without IV contrast.  COMPARISON:  CT 08/21/2014, 08/03/2014  FINDINGS: Lower chest: Mild reticular nodular pattern in the left lung base is increased. Findings could represent resolving pneumonia or aspiration pneumonitis.  Hepatobiliary: There is no intrahepatic biliary duct dilatation. No pneumobilia. Percutaneous drainage catheter extending to the capsule of the right hepatic lobe at the chest wall. This is been retracted from the region of the porta hepatis on comparison CT.  High-density fluid in the gallbladder.  Pancreas: Pancreas is normal. No ductal dilatation. No pancreatic inflammation.  Spleen: Normal spleen  Adrenals/urinary tract: Adrenal glands and kidneys are normal. The ureters and bladder normal.  Stomach/Bowel: The percutaneous gastrostomy tube in the stomach. There is a catheter extending from the gastric body through the first portion the duodenum which presumably is a  J  arm extension of the gastrostomy tube. This tube terminates at the gastric antrum.  Gas in the porta hepatis measuring 19 mm at site of prior extraluminal gas collection. There is no evidence indication with the small bowel. No evidence of bowel obstruction. Contrast flows the entirety of the bowel to the colon. No extravasation of contrast  Vascular/Lymphatic: Abdominal aorta is normal caliber. There is no retroperitoneal or periportal lymphadenopathy. No pelvic lymphadenopathy.  Reproductive: Leiomyomas uterus.  Ovaries are normal.  Musculoskeletal: No aggressive osseous lesion.  Other: No abscess in pelvis.  IMPRESSION: 1. Percutaneous drainage catheters has been withdrawn to the right abdominal wall. 2. Small extraluminal gas collection remains the porta hepatis. No clear communication small bowel. 3. J arm extension has been withdrawn withdrawn into the duodenum bulb / gastric antrum. 4. No evidence of bowel obstruction. 5. No evidence of leakage of oral contrast. 6. No evidence of pelvic abscess per   Electronically Signed   By: Suzy Bouchard M.D.   On: 09/10/2014 17:19    Medications / Allergies:  Scheduled Meds: . ALPRAZolam  0.5 mg Oral Q T,Th,Sa-HD  . amiodarone  400 mg Oral BID  . antiseptic oral rinse  7 mL Mouth Rinse q12n4p  . aspirin  81 mg Oral Daily  . budesonide (PULMICORT) nebulizer solution  0.25 mg Nebulization BID  . calcitRIOL  0.5 mcg Oral Q T,Th,Sat-1800  . chlorhexidine  15 mL Mouth Rinse BID  . darbepoetin (ARANESP) injection - DIALYSIS  200 mcg Intravenous Q Thu-HD  . feeding supplement (PRO-STAT SUGAR FREE 64)  30 mL Per Tube TID  . feeding supplement (RESOURCE BREEZE)  1 Container Oral TID BM  . feeding supplement (VITAL AF 1.2 CAL)  1,000 mL Per Tube Q24H  . ferric gluconate (FERRLECIT/NULECIT) IV  125 mg Intravenous Q T,Th,Sa-HD  . ipratropium  0.5 mg Nebulization TID  . levalbuterol  0.63 mg Nebulization TID  . levothyroxine  25 mcg Oral QAC breakfast  .  metroNIDAZOLE  500 mg Oral 3 times per day  . multivitamin  1 tablet Oral QHS  . sodium chloride  10-40 mL Intracatheter Q12H   Continuous Infusions: . sodium chloride 10 mL/hr at 09/09/14 1849   PRN Meds:.bisacodyl, camphor-menthol, diphenhydrAMINE, diphenhydrAMINE-zinc acetate, diphenoxylate-atropine, HYDROmorphone (DILAUDID) injection, levalbuterol, LORazepam, ondansetron (ZOFRAN) IV, oxyCODONE, sodium chloride  Antibiotics: Anti-infectives    Start     Dose/Rate Route Frequency Ordered Stop   09/09/14 0815  cefUROXime (ZINACEF) 1.5 g in dextrose 5 % 50 mL IVPB     1.5 g 100 mL/hr over 30 Minutes Intravenous To Surgery 09/08/14 0806 09/09/14 1445   09/09/14 0600  cefUROXime (ZINACEF) 1.5 g in dextrose 5 % 50 mL IVPB  Status:  Discontinued     1.5 g 100 mL/hr over 30 Minutes Intravenous On call to O.R. 09/08/14 8242 09/08/14 0817   09/09/14 0400  metroNIDAZOLE (FLAGYL) tablet 500 mg     500 mg Oral 3 times per day 09/08/14 2304     08/27/14 1900  cefTAZidime (FORTAZ) 2 g in dextrose 5 % 50 mL IVPB  Status:  Discontinued     2 g 100 mL/hr over 30 Minutes Intravenous Every T-Th-Sa (Hemodialysis) 08/27/14 1846 09/08/14 0713   08/27/14 1900  metroNIDAZOLE (FLAGYL) IVPB 500 mg  Status:  Discontinued     500 mg 100 mL/hr over 60 Minutes Intravenous Every 8 hours 08/27/14 1847 09/08/14 2304   08/21/14 1430  cefTAZidime (FORTAZ) 1 g in dextrose 5 % 50 mL  IVPB     1 g 100 mL/hr over 30 Minutes Intravenous  Once 08/21/14 1426 08/21/14 1547   08/18/14 1300  metroNIDAZOLE (FLAGYL) IVPB 500 mg  Status:  Discontinued     500 mg 100 mL/hr over 60 Minutes Intravenous 3 times per day 08/18/14 1045 08/27/14 1847   08/18/14 1200  cefTAZidime (FORTAZ) 2 g in dextrose 5 % 50 mL IVPB  Status:  Discontinued     2 g 100 mL/hr over 30 Minutes Intravenous Every T-Th-Sa (Hemodialysis) 08/18/14 1054 08/27/14 1846   08/12/14 1449  ceFAZolin (ANCEF) 2-3 GM-% IVPB SOLR    Comments:  Shaaron Adler   : cabinet  override      08/12/14 1449 08/12/14 1629   08/12/14 1130  ceFAZolin (ANCEF) IVPB 2 g/50 mL premix    Comments:  Hang ON CALL to xray 5/11   2 g 100 mL/hr over 30 Minutes Intravenous To Radiology 08/12/14 1035 08/12/14 1520   08/04/14 1200  vancomycin (VANCOCIN) 50 mg/mL oral solution 125 mg  Status:  Discontinued     125 mg Oral 4 times per day 08/04/14 0851 08/08/14 1229   07/18/14 1800  vancomycin (VANCOCIN) 50 mg/mL oral solution 125 mg  Status:  Discontinued     125 mg Oral 4 times per day 07/18/14 1455 08/04/14 0845   07/17/14 1830  vancomycin (VANCOCIN) IVPB 750 mg/150 ml premix     750 mg 150 mL/hr over 60 Minutes Intravenous  Once 07/17/14 1818 07/18/14 0024   07/15/14 1400  metroNIDAZOLE (FLAGYL) tablet 500 mg  Status:  Discontinued     500 mg Oral 3 times per day 07/15/14 1202 07/18/14 1533   07/13/14 2200  piperacillin-tazobactam (ZOSYN) IVPB 2.25 g  Status:  Discontinued     2.25 g 100 mL/hr over 30 Minutes Intravenous 3 times per day 07/13/14 1318 07/24/14 1138   07/13/14 2000  fluconazole (DIFLUCAN) IVPB 200 mg  Status:  Discontinued     200 mg 100 mL/hr over 60 Minutes Intravenous Every 24 hours 07/13/14 1318 07/19/14 1027   07/07/14 1200  vancomycin (VANCOCIN) IVPB 1000 mg/200 mL premix  Status:  Discontinued     1,000 mg 200 mL/hr over 60 Minutes Intravenous Every 24 hours 07/07/14 0937 07/14/14 1712   07/06/14 2000  fluconazole (DIFLUCAN) IVPB 400 mg  Status:  Discontinued     400 mg 100 mL/hr over 120 Minutes Intravenous Every 24 hours 07/06/14 1507 07/13/14 1318   07/06/14 1800  piperacillin-tazobactam (ZOSYN) IVPB 2.25 g  Status:  Discontinued     2.25 g 100 mL/hr over 30 Minutes Intravenous 4 times per day 07/06/14 1505 07/13/14 1318   07/05/14 2000  fluconazole (DIFLUCAN) IVPB 200 mg  Status:  Discontinued     200 mg 100 mL/hr over 60 Minutes Intravenous Every 24 hours 07/05/14 0757 07/06/14 1507   07/05/14 1400  piperacillin-tazobactam (ZOSYN) IVPB 2.25 g   Status:  Discontinued     2.25 g 100 mL/hr over 30 Minutes Intravenous 3 times per day 07/05/14 0749 07/06/14 1505   07/02/14 1800  vancomycin (VANCOCIN) IVPB 1000 mg/200 mL premix  Status:  Discontinued     1,000 mg 200 mL/hr over 60 Minutes Intravenous Every 24 hours 07/01/14 1858 07/05/14 0801   07/01/14 2200  piperacillin-tazobactam (ZOSYN) IVPB 3.375 g  Status:  Discontinued     3.375 g 100 mL/hr over 30 Minutes Intravenous 4 times per day 07/01/14 1858 07/05/14 0749   07/01/14 2000  fluconazole (DIFLUCAN)  IVPB 400 mg  Status:  Discontinued     400 mg 100 mL/hr over 120 Minutes Intravenous Every 24 hours 07/01/14 1858 07/05/14 0757   07/01/14 1400  fluconazole (DIFLUCAN) IVPB 200 mg  Status:  Discontinued     200 mg 100 mL/hr over 60 Minutes Intravenous Every 24 hours 07/01/14 1330 07/01/14 1853   07/01/14 1000  piperacillin-tazobactam (ZOSYN) IVPB 2.25 g  Status:  Discontinued     2.25 g 100 mL/hr over 30 Minutes Intravenous Every 8 hours 07/01/14 0952 07/01/14 1853   07/01/14 1000  vancomycin (VANCOCIN) 500 mg in sodium chloride 0.9 % 100 mL IVPB     500 mg 100 mL/hr over 60 Minutes Intravenous  Once 07/01/14 0952 07/01/14 1126   06/30/14 1200  vancomycin (VANCOCIN) IVPB 1000 mg/200 mL premix     1,000 mg 200 mL/hr over 60 Minutes Intravenous  Once 06/30/14 0944 06/30/14 1403   06/30/14 1200  ceFEPIme (MAXIPIME) 2 g in dextrose 5 % 50 mL IVPB     2 g 100 mL/hr over 30 Minutes Intravenous  Once 06/30/14 0944 06/30/14 1425   06/30/14 0100  vancomycin (VANCOCIN) 2,000 mg in sodium chloride 0.9 % 500 mL IVPB     2,000 mg 250 mL/hr over 120 Minutes Intravenous  Once 06/30/14 0048 06/30/14 0525   06/30/14 0100  ceFEPIme (MAXIPIME) 2 g in dextrose 5 % 50 mL IVPB     2 g 100 mL/hr over 30 Minutes Intravenous  Once 06/30/14 0048 06/30/14 0322   06/27/14 0600  cefUROXime (ZINACEF) 1.5 g in dextrose 5 % 50 mL IVPB     1.5 g 100 mL/hr over 30 Minutes Intravenous On call to O.R. 06/26/14  1019 06/27/14 0630   06/19/14 1800  oseltamivir (TAMIFLU) capsule 30 mg     30 mg Oral Every M-W-F (1800) 06/19/14 1003 06/22/14 1841   06/18/14 1600  oseltamivir (TAMIFLU) capsule 30 mg  Status:  Discontinued     30 mg Oral Daily 06/18/14 1538 06/19/14 1003        Assessment/Plan: POD #72 s/p Exploratory Laparotomy with graham patch closure of perforated pyloric ulcer, SBR for ischemic bowel - 07/01/2014 - Dalbert Batman  Intra-abdominal (pelvic) fluid collection--resolved per CT  Duodenal fistula-CT did not show extravasation of contrast -drain removed -continue with nocturnal TF until PO is adequate -continue BID wet to dry dressing changes ID-diarrhea, C diff pending VTE prophylaxis-SCD, HIT+ ESRD POD#2 revision of AV fistula FEN-soft diet, TF at night through J tube.  Dispo-stable for CIR from surgical standpoint.  F/u with Dr. Dalbert Batman as an OP 2-3 weeks   Erby Pian, [facility] Surgery Pager [phone]) For consults and floor pages call [phone](7A-4:30P)  09/11/2014 9:00 AM

## 2014-09-11 NOTE — Progress Notes (Signed)
Occupational Therapy Treatment Patient Details Name: [name] MRN: [id number] DOB: [birth year] Today's Date: 09/11/2014    History of present illness [name] is a 59 y.o. female with a history of CKD and hypertension came to the [facility] ED on 3/16 complaining of shortness of breath and generalized weakness x 2 weeks. Admitted with acute hypoxemic respiratory failure. During hospital stay noted to have weakness of her left side. 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  She developed septic shock 4/1 with pressors weaned off 4/3.  Began CRRT and pressors restarted 4/4; Developed pelvic abcess 4/7 with pelvic drain placed 4/8.  weaned off pressors 4/9.  Runs of SVT 4/5 and 4/11. Prolonged hospital course with hypoxemia and hypotension. Right abcominal drain accidentally pulled 5/30 and replaced 5/31   OT comments  Pt with diarrhea today and reliant on bedpan due to urgency.  Pt happy about having  R drain removed. Performed bed mobility and seated ADL, but returned to supine and pt's request due to fear of not being able to get to [facility] in time with diarrhea.  Instructed pt to perform AROM B UE at least 3 times a day, pt verbalized understanding. Pt is eager to go to rehab and walk and care for herself.  Follow Up Recommendations  CIR    Equipment Recommendations       Recommendations for Other Services      Precautions / Restrictions Precautions Precautions: Fall Precaution Comments: G tube, abdominal binder, abdominal wound Other Brace/Splint: abdominal binder       Mobility Bed Mobility Overal bed mobility: Needs Assistance Bed Mobility: Rolling;Sit to Supine;Sidelying to Sit Rolling: Supervision Sidelying to sit: Min guard   Sit to supine: Min assist   General bed mobility comments: cued for log roll technique to reduce abdominal pain/pressure,  used rail and required extra time.  Assist to raise LEs back into bed.  Transfers                      Balance                                   ADL Overall ADL's : Needs assistance/impaired     Grooming: Wash/dry hands;Wash/dry face;Sitting;Set up           Upper Body Dressing : Set up;Bed level                     General ADL Comments: Pt with diarrhea, assisted to bedpan due to urgency and for pericare and change of gown and bed linens.  Pt apologetic.  Left pt in bed due to frequency of BMs at pt's request, does not want to not be able to get to [facility] in time.      Vision                     Perception     Praxis      Cognition   Behavior During Therapy: [facility] for tasks assessed/performed Overall Cognitive Status: Within Functional Limits for tasks assessed                  General Comments: Pt tearful, grateful for her progress and R drain removal today.    Extremity/Trunk  Assessment               Exercises   B UEs x 10 in supine   Shoulder Instructions       General Comments      Pertinent Vitals/ Pain       Pain Assessment: No/denies pain  Home Living                                          Prior Functioning/Environment              Frequency Min 2X/week     Progress Toward Goals  OT Goals(current goals can now be found in the care plan section)  Progress towards OT goals: Progressing toward goals  Acute Rehab OT Goals Patient Stated Goal: to get back to normal Time For Goal Achievement: 09/14/14 Potential to Achieve Goals: Good  Plan Discharge plan needs to be updated    Co-evaluation                 End of Session     Activity Tolerance     Patient Left in bed;with call bell/phone within reach   Nurse Communication          Time: 6195-0932 OT Time Calculation (min): 30 min  Charges: OT General Charges $OT Visit: 1 Procedure OT  Treatments $Self Care/Home Management : 23-37 mins  Evern Bio 09/11/2014, 9:17 AM  [phone]

## 2014-09-11 NOTE — Progress Notes (Signed)
Pt admitted to rehab from [phone]. Alert and orientated x 4. Diagnostic specific information provided.Rehab expectations and therapy scheduled discussed. Pt to begin rehab in the a.m.

## 2014-09-11 NOTE — Progress Notes (Signed)
Utilization review completed.  

## 2014-09-11 NOTE — Interval H&P Note (Signed)
[name] was admitted today to Inpatient Rehabilitation with the diagnosis of right ACA infarct.  The patient's history has been reviewed, patient examined, and there is no change in status.  Patient continues to be appropriate for intensive inpatient rehabilitation.  I have reviewed the patient's chart and labs.  Questions were answered to the patient's satisfaction. The PAPE has been reviewed and assessment remains appropriate.  SWARTZ,ZACHARY T 09/11/2014, 5:46 PM

## 2014-09-11 NOTE — Progress Notes (Signed)
Nutrition Follow-up  DOCUMENTATION CODES:  Obesity unspecified  INTERVENTION:  48 hour Calorie count initiated-- RD to follow up Monday 6/13 for full calorie count results.   Boost Breeze po TID, each supplement provides 250 kcal and 9 grams of protein  Nocturnal TF regimen: Continue Vital AF 1.2 formula at 70 ml/hr x 12 hours (infuse 8 PM to 8 AM) with 30 ml Prostat TID to provide 1308 kcals (62% of kcal needs), 108 gm protein (90% of protein needs), 681 ml of free water   RD to continue to monitor.   NUTRITION DIAGNOSIS:  Inadequate oral intake related to  (limited appetite) as evidenced by meal completion < 50%; ongoing  GOAL:  Patient will meet greater than or equal to 90% of their needs; progressing  MONITOR:  PO intake, TF tolerance, Labs, Weight trends, I & O's  REASON FOR ASSESSMENT:  Consult Calorie Count  ASSESSMENT: Pt with CKD and hypertension, presented on 3/16 with shortness of breath and generalized weakness x 2 weeks. Pt found to have metabolic acidosis from worsening uremia, H1N1 flu. Pt started on HD. 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  Patient s/p procedure 3/30: EXPLORATORY LAPAROTOMY CLOSURE OF PERFORATED PYLORIC ULCER SMALL BOWEL RESECTION  Patient s/p procedure 5/11: G-J TUBE PLACEMENT -- tip past ligament of Treitz  Calorie count has been ordered. Spoke with RN regarding calorie count instructions. Pt has been tolerating her tube feeds, however has been having diarrhea. RN reports appetite has been picking up a bit with consumption of half of a big mac hamburger last night and at least consumption of Resource Breeze 1-2 times daily and snacks. Pt is determined to eat better to have her TF stopped. Pt was encouraged to eat her food at meals and to drink her supplements. RD to follow up Monday 6/13 with calorie count results.   Plans for pt to go to CIR.  Labs and medications  reviewed.   Height:  Ht Readings from Last 1 Encounters:  08/02/14 5\' 6"  (1.676 m)    Weight:  Wt Readings from Last 1 Encounters:  09/10/14 218 lb 3.2 oz (98.975 kg)    Ideal Body Weight:  59.1 kg  Wt Readings from Last 10 Encounters:  09/10/14 218 lb 3.2 oz (98.975 kg)  06/01/14 225 lb (102.059 kg)  01/28/14 230 lb (104.327 kg)  01/21/14 231 lb 1.6 oz (104.826 kg)  10/02/12 216 lb 8 oz (98.204 kg)    BMI:  Body mass index is 35.24 kg/(m^2).  Estimated Nutritional Needs:  Kcal:  2100-2300  Protein:  120-140 gm  Fluid:  per MD  Skin:  Wound (see comment) (Stage I pressure ulcer on sacrum, incision on abdomen and L arm)  Diet Order:  DIET SOFT Room service appropriate?: Yes; Fluid consistency:: Thin Diet - low sodium heart healthy  EDUCATION NEEDS:  Education needs no appropriate at this time   Intake/Output Summary (Last 24 hours) at 09/11/14 1254 Last data filed at 09/11/14 1115  Gross per 24 hour  Intake 2002.33 ml  Output      0 ml  Net 2002.33 ml    Last BM:  6/10  Roslyn Smiling, MS, RD, LDN Pager # [phone] After hours/ weekend pager # [phone]

## 2014-09-11 NOTE — Progress Notes (Signed)
[name], female   DOB: Jan 25, 1956, 59 y.o.   MRN: [id number] Comfortable. Dressing removed from left antecubital area. Subcuticular incision healing quite nicely. No need for dressing. Excellent thrill throughout the upper arm fistula  Will not follow actively. Can begin access of left upper arm AV fistula in another 2 weeks once the incision is healed.

## 2014-09-11 NOTE — Care Management Note (Signed)
Case Management Note  Patient Details  Name: Laresa LAMIRA PATTEE MRN: [id number] Date of Birth: 28-Mar-1956  Subjective/Objective:         CM following for progression and d/c planning.           Action/Plan: 09-11-14 Plan for discharge to CIR today.    09/10/14 Spoke with CIR adm coord, Genie re plan for this pt. Per hemidialysis center they have stared the CLIP process and are currently awaiting seat for outpatient HD. The CLIP process needs to be completed before this pt can go to CIR.  Noted surgery note re abd drain. If drain is removed the pt would be ready of CIR if the drain needs to be replaced she will not be able to go to CIR.   Expected Discharge Date:   (unknown)               Expected Discharge Plan:  IP Rehab Facility  In-House Referral:  NA  Discharge planning Services     Post Acute Care Choice:  NA Choice offered to:  NA  DME Arranged:    DME Agency:     HH Arranged:    HH Agency:     Status of Service:  In process, will continue to follow  Medicare Important Message Given:  No Date Medicare IM Given:    Medicare IM give by:    Date Additional Medicare IM Given:    Additional Medicare Important Message give by:     If discussed at Long Length of Stay Meetings, dates discussed:    Additional Comments:  Vangie Bicker, RN 09/11/2014, 1:24 PM

## 2014-09-11 NOTE — Progress Notes (Signed)
Rehab admissions - Patient has been medically cleared for acute inpatient rehab admission.  Bed available and will admit to acute inpatient rehab today.  Call me for questions.  #[phone] 

## 2014-09-12 ENCOUNTER — Inpatient Hospital Stay (HOSPITAL_COMMUNITY): Payer: Self-pay | Admitting: Occupational Therapy

## 2014-09-12 ENCOUNTER — Inpatient Hospital Stay (HOSPITAL_COMMUNITY): Payer: Self-pay | Admitting: Rehabilitation

## 2014-09-12 LAB — CBC
HEMATOCRIT: 27.7 % — AB (ref 36.0–46.0)
Hemoglobin: 9 g/dL — ABNORMAL LOW (ref 12.0–15.0)
MCH: 29.3 pg (ref 26.0–34.0)
MCHC: 32.5 g/dL (ref 30.0–36.0)
MCV: 90.2 fL (ref 78.0–100.0)
Platelets: 370 10*3/uL (ref 150–400)
RBC: 3.07 MIL/uL — AB (ref 3.87–5.11)
RDW: 18.9 % — ABNORMAL HIGH (ref 11.5–15.5)
WBC: 10 10*3/uL (ref 4.0–10.5)

## 2014-09-12 LAB — RENAL FUNCTION PANEL
Albumin: 2.4 g/dL — ABNORMAL LOW (ref 3.5–5.0)
Anion gap: 16 — ABNORMAL HIGH (ref 5–15)
BUN: 51 mg/dL — AB (ref 6–20)
CALCIUM: 8.3 mg/dL — AB (ref 8.9–10.3)
CO2: 21 mmol/L — AB (ref 22–32)
Chloride: 88 mmol/L — ABNORMAL LOW (ref 101–111)
Creatinine, Ser: 4.66 mg/dL — ABNORMAL HIGH (ref 0.44–1.00)
GFR calc Af Amer: 11 mL/min — ABNORMAL LOW (ref >60)
GFR calc non Af Amer: 9 mL/min — ABNORMAL LOW (ref >60)
Glucose, Bld: 82 mg/dL (ref 65–99)
Phosphorus: 7.6 mg/dL — ABNORMAL HIGH (ref 2.5–4.6)
Potassium: 4.5 mmol/L (ref 3.5–5.1)
SODIUM: 125 mmol/L — AB (ref 135–145)

## 2014-09-12 MED ORDER — SODIUM CHLORIDE 0.9 % IV SOLN: 100.0000 mL | INTRAVENOUS | Status: DC | PRN

## 2014-09-12 MED ORDER — NEPRO/CARBSTEADY PO LIQD
237.0000 mL | ORAL | Status: DC | PRN
Start: 2014-09-12 — End: 2014-09-12
  Filled 2014-09-12: qty 237

## 2014-09-12 MED ORDER — LIDOCAINE-PRILOCAINE 2.5-2.5 % EX CREA
1.0000 "application " | TOPICAL_CREAM | CUTANEOUS | Status: DC | PRN
  Filled 2014-09-12: qty 5

## 2014-09-12 MED ORDER — ALTEPLASE 2 MG IJ SOLR
2.0000 mg | Freq: Once | INTRAMUSCULAR | Status: DC | PRN
Start: 2014-09-12 — End: 2014-09-12
  Filled 2014-09-12: qty 2

## 2014-09-12 MED ORDER — HEPARIN SODIUM (PORCINE) 1000 UNIT/ML DIALYSIS: 1000.0000 [IU] | INTRAMUSCULAR | Status: DC | PRN

## 2014-09-12 MED ORDER — SODIUM CHLORIDE 0.9 % IV SOLN
100.0000 mL | INTRAVENOUS | Status: DC | PRN
Start: 2014-09-12 — End: 2014-09-12

## 2014-09-12 MED ORDER — LIDOCAINE HCL (PF) 1 % IJ SOLN: 5.0000 mL | INTRAMUSCULAR | Status: DC | PRN

## 2014-09-12 MED ORDER — CALCITRIOL 0.5 MCG PO CAPS
ORAL_CAPSULE | ORAL | Status: AC
  Administered 2014-09-12: 0.5 ug via ORAL
  Filled 2014-09-12: qty 1

## 2014-09-12 MED ORDER — PENTAFLUOROPROP-TETRAFLUOROETH EX AERO: 1.0000 "application " | INHALATION_SPRAY | CUTANEOUS | Status: DC | PRN

## 2014-09-12 MED ORDER — ANTICOAGULANT SODIUM CITRATE 4% (200MG/5ML) IV SOLN
5.0000 mL | Status: AC
  Administered 2014-09-12: 5 mL via INTRAVENOUS
  Filled 2014-09-12: qty 250

## 2014-09-12 NOTE — Progress Notes (Signed)
Occupational Therapy Assessment and Plan  Patient Details  Name: [name] MRN: [id number] Date of Birth: [birth year]  OT Diagnosis: abnormal posture, muscle weakness (generalized) and coordination disorder, hemiparesis non dominant side Rehab Potential: Rehab Potential (ACUTE ONLY): Good ELOS: 14-17 days   Today's Date: 09/12/2014 OT Individual Time: 1000-1100 OT Individual Time Calculation (min): 60 min     Problem List:  Patient Active Problem List   Diagnosis Date Noted  . Acute right ACA stroke 09/11/2014  . Left hemiparesis 09/11/2014  . History of ETT   . Duodenal fistula   . Sepsis   . Aortic aneurysm 08/13/2014  . Protein-calorie malnutrition   . Itching 08/12/2014  . Duodenal anastomotic leak   . Protein calorie malnutrition   . SVT (supraventricular tachycardia)   . Right heart failure   . ESRD (end stage renal disease)   . Abdominal pain   . Palliative care encounter   . 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 Fistula   . History of intermediate V/Q scan   . Other emphysema   . Intra-abdominal abscess   . Abnormal TSH   . Enteritis due to Clostridium difficile 07/19/2014  . Abdominal abscess   . Pelvic fluid collection   . Perforated gastric ulcer 07/01/2014  . SOB (shortness of breath)   . PJRT (permanent junctional reciprocating tachycardia)   . Paroxysmal atrial tachycardia   . PSVT (paroxysmal supraventricular tachycardia)   . Stroke   . Hyperlipidemia   . 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 Hypertension 06/20/2014  . ESRD needing dialysis 06/20/2014  . Obesity (BMI 30-39.9) 06/20/2014  . Depression 06/20/2014  . Left renal mass 06/20/2014  . Chronic diastolic heart failure 73/22/0254  . H1N1 influenza 06/20/2014  . Tobacco use disorder 06/20/2014  . ARF (acute renal failure)   . Renal failure (ARF), acute on chronic   . Acute respiratory failure with hypoxia 06/17/2014  . End stage renal disease  01/21/2014    Past Medical History:  Past Medical History  Diagnosis Date  . Asthma   . Hypertension   . Gout   . Arthritis   . Insomnia   . Chronic kidney disease   . Depression   . Renal insufficiency   . Aortic aneurysm without rupture     3cm by CT 08/03/14  . Paroxysmal SVT (supraventricular tachycardia)   . Cor pulmonale   . Stroke 06/2014  . PFO (patent foramen ovale)   . COPD (chronic obstructive pulmonary disease)    Past Surgical History:  Past Surgical History  Procedure Laterality Date  . Multiple tooth extractions    . Av fistula placement Left 01/28/2014    Procedure: ARTERIOVENOUS (AV) FISTULA CREATION;  Surgeon: Elam Dutch, MD;  Location: [place];  Service: Vascular;  Laterality: Left;  . Tee without cardioversion N/A 06/23/2014    Procedure: TRANSESOPHAGEAL ECHOCARDIOGRAM (TEE);  Surgeon: Lelon Perla, MD;  Location: [place];  Service: Cardiovascular;  Laterality: N/A;  . Insertion of dialysis catheter Right 06/27/2014    Procedure: INSERTION OF Right Internal Jugular DIATEK CATHETER;  Surgeon: Mal Misty, MD;  Location: [place];  Service: Vascular;  Laterality: Right;  . Laparotomy N/A 07/01/2014    Procedure: EXPLORATORY LAPAROTOMY WITH CLOSURE OF PERFORATED PYLORIC ULCER;  Surgeon: Fanny Skates, MD;  Location: [place];  Service: General;  Laterality: N/A;  . Bowel resection N/A 07/01/2014    Procedure: SMALL BOWEL RESECTION;  Surgeon: Fanny Skates, MD;  Location: [facility]  OR;  Service: General;  Laterality: N/A;  . Revison of arteriovenous fistula Left 09/09/2014    Procedure: LIGATION OF COMPETING BRANCH, & RESECTION OF VENOUS ANEURYSM OF LEFT UPPER ARM ARTERIOVENOUS FISTULA;  Surgeon: Rosetta Posner, MD;  Location: [facility] OR;  Service: Vascular;  Laterality: Left;    Assessment & Plan Clinical Impression: Patient is a 59 y.o. year old female withhistory of stage V chronic kidney disease status post fistula placement followed by nephrologist at [facility] Dr. Sheppard Coil, hypertension, tobacco abuse. Independent prior to admission living with her daughter. Presented 06/17/2014 to [facility] with progressive shortness of breath 2 weeks and lower extremity edema. By report patient has stopped taking her diuretics 2 weeks ago. Findings of elevated creatinine 5.5 baseline of 2.64. Also noted poor appetite related to symptoms of upper respiratory infection. Chest x-ray showed no infiltrate or failure. Renal ultrasound with no hydronephrosis. 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 Nephrology service follow-up placed on intravenous Lasix. Neurology consulted for left sided weakness. MRI of the brain 06/20/2014 shows multifocal areas of acute infarct throughout both hemispheres with the largest involving the right medial parasagittal frontal and posterior frontal cortex. MRA with no large vessel occlusion. Carotid Dopplers with no ICA stenosis. Echocardiogram with ejection fraction 65% and grade 1 diastolic dysfunction. Patient placed on full dose aspirin therapy. TEE performed showing normal LV function and no thrombus. Positive bubble study indicating a small PFO. Lower extremity Dopplers negative for DVT. Underwent placement of loop recorder 06/25/2014 per Dr. Caryl Comes. Patient with intermittent runs of PSVT placed on beta blocker. Renal function without change and hemodialysis initiated right internal jugular DIATEK catheter placed 06/27/2014 and later with placement of left arm AV fistula 09/09/2014. Developed acute abdominal pain 07/01/2014. CT scan showed massive pneumoperitoneum and ascites and also pneumatosis of a short segment of the ileum. General surgery consulted underwent exploratory laparotomy evacuation of ascites with closure of perforated pyloric ulcer with omental patch and segmental resection of ileum 07/01/2014 per Dr. Dalbert Batman. Patient required full  ventilatory support followed by critical care medicine. Wound care nurse consulted for need for VAC to abdominal wound. Placed on contact precautions for Clostridium difficile. Follow-up CT of abdomen showed no signs of abscess. Red rubber drain has been removed. Patient is currently nothing by mouth with interventional radiology placing gastrostomy tube 08/12/2014 and diet advanced to mechanical soft and still receiving tube feeds 12 hours. Therapies initiated slow overall progress. M.D. has requested physical medicine rehabilitation .  Patient transferred to CIR on 09/11/2014 .    Patient currently requires mod with basic self-care skills secondary to muscle weakness, decreased cardiorespiratoy endurance and decreased sitting balance, decreased standing balance, decreased postural control, decreased balance strategies and hemiparesis.  Prior to hospitalization, patient could complete ADLs  with independent .  Patient will benefit from skilled intervention to increase independence with basic self-care skills prior to discharge home with care partner.  Anticipate patient will require 24 hour supervision and follow up outpatient services.  OT - End of Session Activity Tolerance: Decreased this session Endurance Deficit: Yes Endurance Deficit Description: Pt severely limited by decreased endurance and note that SaO2 dropped during functional transfers and ambulation OT Assessment Rehab Potential (ACUTE ONLY): Good Barriers to Discharge:  (none known at this time) OT Patient demonstrates impairments in the following area(s): Balance;Safety;Endurance;Motor OT Basic ADL's Functional Problem(s): Eating;Grooming;Bathing;Dressing;Toileting OT Advanced ADL's Functional Problem(s):  (n/a) OT Transfers Functional Problem(s):  Toilet;Tub/Shower OT Additional Impairment(s): Fuctional Use of Upper Extremity OT Plan OT Intensity: Minimum of 1-2 x/day, 45 to 90 minutes OT Frequency: 5 out of 7 days OT  Duration/Estimated Length of Stay: 14-17 days OT Treatment/Interventions: Medical illustrator training;Community reintegration;Neuromuscular re-education;Patient/family education;Self Care/advanced ADL retraining;Therapeutic Exercise;UE/LE Coordination activities;UE/LE Strength taining/ROM;Therapeutic Activities;Functional mobility training;DME/adaptive equipment instruction;Pain management;Discharge planning OT Self Feeding Anticipated Outcome(s): set up  OT Basic Self-Care Anticipated Outcome(s): overall supervision OT Toileting Anticipated Outcome(s): overall supervision OT Bathroom Transfers Anticipated Outcome(s): overall supervision OT Recommendation Recommendations for Other Services:  (none) Patient destination: Home Follow Up Recommendations: Outpatient OT;24 hour supervision/assistance Equipment Recommended: Tub/shower bench;3 in 1 bedside comode   Skilled Therapeutic Intervention Upon entering the room, pt seated in wheelchair with no c/o pain. OT educated pt on OT purpose, POC, and goals with pt verbalizing understanding. Pt seated in wheelchair at sink side for self care tasks. Pt requiring mod A STS from wheelchair secondary to difficulty with anterior weight shift. Pt ambulated with min A to bathroom for toileting with assistance for clothing management. Pt able to successfully void this session. Mod A toilet transfer for lifting assistance. Pt ambulated back to wheelchair in same manner as stated previously. Pt remained seated in wheelchair with call bell and all needed items within reach upon exiting the room.    OT Evaluation Precautions/Restrictions  Precautions Precautions: Fall Precaution Comments:  abdominal binder, abdominal wound Other Brace/Splint: abdominal binder Restrictions Weight Bearing Restrictions: No Vital Signs Oxygen Therapy SpO2: 98 % O2 Device: Nasal Cannula O2 Flow Rate (L/min): 2 L/min Pain Pain Assessment Pain Assessment: No/denies pain Pain  Score: 0-No pain Home Living/Prior Functioning Home Living Available Help at Discharge: Family, Available 24 hours/day Type of Home: Apartment Home Access: Stairs to enter Technical brewer of Steps: 12 Entrance Stairs-Rails: Right, Left (at some point, there are two rails, but starts as R rail) Home Layout: One level Additional Comments: tub shower, standard height toilets  Lives With: Son, Daughter Prior Function Level of Independence: Independent with basic ADLs, Independent with transfers Leisure: Hobbies-yes (Comment) (going out to eat) Comments: Family was assisting with meals and ensuring pt taking proper medication and bill management, would use a walker when having gout flare up Vision/Perception  Vision- History Baseline Vision/History: No visual deficits Patient Visual Report: No change from baseline (reports eyes feel weak)  Cognition Arousal/Alertness: Awake/alert Orientation Level: Person;Place;Situation Person: Oriented Place: Oriented Situation: Oriented Year: 2016 Month: [place] Day of Week: Correct Memory: Appears intact Immediate Memory Recall: Sock;Blue;Bed Memory Recall: Blue;Bed (unable to recall sock) Memory Recall Blue: Without Cue Memory Recall Bed: With Cue Sensation Sensation Light Touch: Appears Intact Stereognosis: Not tested Hot/Cold: Appears Intact Proprioception: Appears Intact Coordination Gross Motor Movements are Fluid and Coordinated: No Fine Motor Movements are Fluid and Coordinated: No Coordination and Movement Description: Pt with decreased strength and mobility in LLE/LUE Motor  Motor Motor: Hemiplegia;Other (comment) Motor - Skilled Clinical Observations: LUE/LE weakness, decreased balance, decreased endurance Mobility  Bed Mobility Bed Mobility: Supine to Sit;Sit to Supine Supine to Sit: 5: Supervision Supine to Sit Details: Verbal cues for sequencing;Verbal cues for precautions/safety;Verbal cues for technique Sit to  Supine: 4: Min assist Sit to Supine - Details: Verbal cues for sequencing;Verbal cues for technique;Verbal cues for precautions/safety;Manual facilitation for placement Transfers Sit to Stand: 3: Mod assist Sit to Stand Details: Verbal cues for sequencing;Verbal cues for technique;Verbal cues for precautions/safety;Manual facilitation for weight shifting;Manual facilitation for placement;Manual facilitation for weight bearing Stand to Sit: 3: Mod assist  Stand to Sit Details (indicate cue type and reason): Verbal cues for sequencing;Verbal cues for technique;Verbal cues for precautions/safety;Manual facilitation for weight shifting;Manual facilitation for weight bearing;Manual facilitation for placement  Trunk/Postural Assessment  Cervical Assessment Cervical Assessment: Within Functional Limits Thoracic Assessment Thoracic Assessment: Within Functional Limits Lumbar Assessment Lumbar Assessment: Exceptions to [facility] Lumbar Strength Overall Lumbar Strength Comments: posterior pelvic tilt Postural Control Postural Control: Deficits on evaluation Protective Responses: Pt with very delayed to no protective responses and relies on UEs for balance Postural Limitations: Pt with severe posterior lean with transitioning from sit<>stand.   Balance Balance Balance Assessed: Yes Static Sitting Balance Static Sitting - Balance Support: Feet supported Static Sitting - Level of Assistance: 5: Stand by assistance Dynamic Sitting Balance Dynamic Sitting - Balance Support: Feet supported Dynamic Sitting - Level of Assistance: 4: Min assist Static Standing Balance Static Standing - Balance Support: During functional activity Static Standing - Level of Assistance: 4: Min assist Dynamic Standing Balance Dynamic Standing - Balance Support: During functional activity;Bilateral upper extremity supported Dynamic Standing - Level of Assistance: 4: Min assist Extremity/Trunk Assessment RUE Assessment RUE  Assessment: Within Functional Limits LUE Assessment LUE Assessment: Exceptions to [facility] (shoulder flexion ~ 110, 3+/5 throughout)  FIM:  FIM - Grooming Grooming Steps: Wash, rinse, dry face;Wash, rinse, dry hands;Oral care, brush teeth, clean dentures;Brush, comb hair Grooming: 5: Set-up assist to obtain items FIM - Bathing Bathing Steps Patient Completed: Chest;Right Arm;Left Arm;Abdomen;Right upper leg;Left upper leg;Front perineal area Bathing: 3: Mod-Patient completes 5-7 [id number] parts or 50-74% FIM - Upper Body Dressing/Undressing Upper body dressing/undressing: 0: Wears gown/pajamas-no public clothing FIM - Lower Body Dressing/Undressing Lower body dressing/undressing: 0: Wears gown/pajamas-no public clothing FIM - Toileting Toileting steps completed by patient: Performs perineal hygiene Toileting: 3: Mod-Patient completed 2 of 3 steps FIM - BControl and instrumentation engineerDevices: WCopy 5: Supine > Sit: Supervision (verbal cues/safety issues);4: Sit > Supine: Min A (steadying pt. > 75%/lift 1 leg);4: Bed > Chair or W/C: Min A (steadying Pt. > 75%);4: Chair or W/C > Bed: Min A (steadying Pt. > 75%) FIM - TRadio producerDevices: Elevated toilet seat;Walker;Grab bars Toilet Transfers: 3-To toilet/BSC: Mod A (lift or lower assist);3-From toilet/BSC: Mod A (lift or lower assist)   Refer to Care Plan for Long Term Goals  Recommendations for other services: None  Discharge Criteria: Patient will be discharged from OT if patient refuses treatment 3 consecutive times without medical reason, if treatment goals not met, if there is a change in medical status, if patient makes no progress towards goals or if patient is discharged from hospital.  The above assessment, treatment plan, treatment alternatives and goals were discussed and mutually agreed upon: by patient  PPhineas Semen6/02/2015, 12:47 PM

## 2014-09-12 NOTE — Progress Notes (Signed)
Physical Therapy Session Note  Patient Details  Name: [name] MRN: [id number] Date of Birth: [birth year]  Today's Date: 09/12/2014 PT Individual Time: 1415-1530 PT Individual Time Calculation (min): 75 min   Short Term Goals: Week 1:  PT Short Term Goal 1 (Week 1): Pt will perform sit<>stand with UE support at min A level  PT Short Term Goal 2 (Week 1): Pt will perform stand pivot transfers with LRAD at min A level  PT Short Term Goal 3 (Week 1): Pt will tolerate dynamic standing task for 2 mins with SaO2 remaining in the 90's.  PT Short Term Goal 4 (Week 1): Pt will ambulate x 46' w/ LRAD at S min/guard level with SaO2 in the 90's.    Skilled Therapeutic Interventions/Progress Updates:  Pt received sitting in w/c in room, agreeable to therapy session.  Pt stating being uncomfortable in w/c, therefore provided options of sitting up in recliner for more support but that she would need to let RN or nurse tech know via call bell. Pt verbalized understanding.  Assisted pt to/from therapy gym via w/c at total A level for time management and energy conservation.  Once in therapy gym, performed 4, 6" step with B handrails in step to fashion at min A level with cues for pursed lip breathing and stepping technique with facilitation for weight shift.  Progressed to performing sit<>stand with BUEs on lap to increase amount of LE use during standing with facilitation at trunk for anterior pelvic tilt and increased forward weight shift.  Performed x 5 with rest breaks in between.  During rest breaks, worked on diaphragmatic breathing with hand on chest and hand on abdomen with cues to increase amount of chest rise vs abdomen rise.  Progressed to standing exercise in front of // bars for generalized strengthening, NMR through LLE and activity tolerance as follows; standing hip abd, standing knee flex and standing mini squats x 10 reps each BLE.  Ended session with seated nustep x 7 mins with BUE/LEs at level  2 resistance, again for generalized strengthening, L UE/LE NMR and activity tolerance.  Transferred back to w/c and back to room.  Transferred to bed to prepare for dialysis at min A level.  All needs in reach and RT in room for breathing treatment.   Therapy Documentation Precautions:  Precautions Precautions: Fall Precaution Comments:  abdominal binder, abdominal wound Other Brace/Splint: abdominal binder Restrictions Weight Bearing Restrictions: No   Vital Signs: Oxygen Therapy SpO2: 98 % O2 Device: Nasal Cannula O2 Flow Rate (L/min): 2 L/min Pain: Pain Assessment Pain Assessment: No/denies pain Pain Score: 0-No pain  See FIM for current functional status  Therapy/Group: Individual Therapy  Vista Deck 09/12/2014, 12:51 PM

## 2014-09-12 NOTE — Progress Notes (Signed)
Nutrition Brief Note  Consult received for management of enteral nutrition.  Pt is currently on Nocturnal TF regimen. Tolerating well per nursing. - Continue Vital AF 1.2 formula at 70 ml/hr x 12 hours (infuse 8 PM to 8 AM) with 30 ml Prostat TID to provide 1308 kcals (62% of kcal needs), 108 gm protein (90% of protein needs), 681 ml of free water.   -Calorie count underway per RD note 6/10. Results to follow on Monday 6/13. Po intake records 0-10% most meals.  -Resource Breeze po TID, each supplement provides 250 kcal and 9 grams of protein.  No additional changes in regimen at this time.   Royann Shivers MS,RD,CSG,LDN Office: [phone] Pager: [phone]

## 2014-09-12 NOTE — Progress Notes (Signed)
Subjective:  Sitting in chair, already anxious about dialysis today, only one episode of diarrhea (ths AM ) since yesterday  Objective: Vital signs in last 24 hours: Temp:  [97.5 F (36.4 C)-97.9 F (36.6 C)] 97.5 F (36.4 C) (06/11 0534) Pulse Rate:  [95-120] 95 (06/11 0534) Resp:  [16-18] 18 (06/11 0534) BP: (103-126)/(62-80) 126/80 mmHg (06/11 0534) SpO2:  [94 %-98 %] 98 % (06/11 0910) Weight:  [97.8 kg (215 lb 9.8 oz)-100.109 kg (220 lb 11.2 oz)] 100.109 kg (220 lb 11.2 oz) (06/11 0534) Weight change:   Intake/Output from previous day: 06/10 0701 - 06/11 0700 In: 240 [P.O.:240] Out: -  Intake/Output this shift: Total I/O In: 120 [P.O.:120] Out: -   Lab Results:  Recent Labs  09/09/14 2120 09/10/14 0741  WBC 8.8 9.8  HGB 9.5* 8.8*  HCT 30.3* 27.9*  PLT 395 378   BMET:  Recent Labs  09/09/14 2120 09/10/14 0741  NA 135 134*  K 3.6 4.0  CL 96* 95*  CO2 25 23  GLUCOSE 166* 108*  BUN 29* 38*  CREATININE 4.35* 4.67*  CALCIUM 8.4* 8.0*  ALBUMIN 2.1* 2.1*   No results for input(s): PTH in the last 72 hours. Iron Studies: No results for input(s): IRON, TIBC, TRANSFERRIN, FERRITIN in the last 72 hours.  Studies/Results: No results found.  EXAM: General appearance: Alert, in no apparent distress Resp:  Scattered expiratory wheezes with bibasilar rales   Cardio: RRR with Gr II/VI systolic murmur, no rub GI: Binders, PEG, RUQ drain, mild L-side tenderness Extremities: No LE edema 1+pre sacral Access: R IJ catheter, AVF @ LUA with + bruit  Assessment/Plan: 1. Perforated pyloric ulcer / abdominal abscess - s/p repair with partial ileum resection 3/30, post-op ileus, with resolving enterocutaneous fistula, per surgery, moved to rehab yesterday.Surgery f/u  2. ESRD - new start to HD, on TTS, using R IJ catheter; AVF placed 01/28/14 by Dr. Darrick Penna, s/p revision 6/8 per Dr. Arbie Cookey, may use in 2 wks.  3. Hypotension/Volume - BP 126/80, no meds; wt 100.1 kg, UF goal  2-3 L today. 4. Anemia - Hgb 8.8, Aranesp 200 mcg on Thurs, started Fe 6/9. 5. Sec HPT - Ca 8 (9.5 corrected), P 8.2; Calcitriol 0.5 mcg, no binder. 6. Nutrition - Alb 2.1, soft diet, vitamin.  7. C diff colitis - now negative. 8. Anxiety - Xanax pre-HD. Severe, may need psych input and longer acting anxieolytic 9. CVA   LOS: 1 day   [name],[name] 09/12/2014,10:08 AM I have seen and examined this patient and agree with the plan of care seen, eval, for HD this pm .  Rehab this am.  Use new access soon .  Mignonne Afonso L 09/12/2014, 10:40 AM

## 2014-09-12 NOTE — Progress Notes (Signed)
Pt completed HD with no complications. Goal met. Alert, vss, no c/o. Report called to primary RN. Primary RN will transport patient safely to room.

## 2014-09-12 NOTE — Progress Notes (Signed)
[place] PHYSICAL MEDICINE & REHABILITATION     PROGRESS NOTE    Subjective/Complaints: Had a pretty good night. Pain in left arm better. Feels better when corset loosened in bed.  Objective: Vital Signs: Blood pressure 126/80, pulse 95, temperature 97.5 F (36.4 C), temperature source Oral, resp. rate 18, height 5\' 7"  (1.702 m), weight 100.109 kg (220 lb 11.2 oz), SpO2 98 %. Ct Abdomen Pelvis Wo Contrast  09/10/2014   CLINICAL DATA:  Follow-up small bowel fistula  EXAM: CT ABDOMEN AND PELVIS WITHOUT CONTRAST  TECHNIQUE: Multidetector CT imaging of the abdomen and pelvis was performed following the standard protocol without IV contrast.  COMPARISON:  CT 08/21/2014, 08/03/2014  FINDINGS: Lower chest: Mild reticular nodular pattern in the left lung base is increased. Findings could represent resolving pneumonia or aspiration pneumonitis.  Hepatobiliary: There is no intrahepatic biliary duct dilatation. No pneumobilia. Percutaneous drainage catheter extending to the capsule of the right hepatic lobe at the chest wall. This is been retracted from the region of the porta hepatis on comparison CT.  High-density fluid in the gallbladder.  Pancreas: Pancreas is normal. No ductal dilatation. No pancreatic inflammation.  Spleen: Normal spleen  Adrenals/urinary tract: Adrenal glands and kidneys are normal. The ureters and bladder normal.  Stomach/Bowel: The percutaneous gastrostomy tube in the stomach. There is a catheter extending from the gastric body through the first portion the duodenum which presumably is a J arm extension of the gastrostomy tube. This tube terminates at the gastric antrum.  Gas in the porta hepatis measuring 19 mm at site of prior extraluminal gas collection. There is no evidence indication with the small bowel. No evidence of bowel obstruction. Contrast flows the entirety of the bowel to the colon. No extravasation of contrast  Vascular/Lymphatic: Abdominal aorta is normal caliber. There  is no retroperitoneal or periportal lymphadenopathy. No pelvic lymphadenopathy.  Reproductive: Leiomyomas uterus.  Ovaries are normal.  Musculoskeletal: No aggressive osseous lesion.  Other: No abscess in pelvis.  IMPRESSION: 1. Percutaneous drainage catheters has been withdrawn to the right abdominal wall. 2. Small extraluminal gas collection remains the porta hepatis. No clear communication small bowel. 3. J arm extension has been withdrawn withdrawn into the duodenum bulb / gastric antrum. 4. No evidence of bowel obstruction. 5. No evidence of leakage of oral contrast. 6. No evidence of pelvic abscess per   Electronically Signed   By: Genevive Bi M.D.   On: 09/10/2014 17:19    Recent Labs  09/09/14 2120 09/10/14 0741  WBC 8.8 9.8  HGB 9.5* 8.8*  HCT 30.3* 27.9*  PLT 395 378    Recent Labs  09/09/14 2120 09/10/14 0741  NA 135 134*  K 3.6 4.0  CL 96* 95*  GLUCOSE 166* 108*  BUN 29* 38*  CREATININE 4.35* 4.67*  CALCIUM 8.4* 8.0*   CBG (last 3)  No results for input(s): GLUCAP in the last 72 hours.  Wt Readings from Last 3 Encounters:  09/12/14 100.109 kg (220 lb 11.2 oz)  09/10/14 98.975 kg (218 lb 3.2 oz)  06/01/14 102.059 kg (225 lb)    Physical Exam:  Gen: alert, no distress, obese HENT:  Poor dentition,  Eyes: EOM are normal.  Neck: Normal range of motion. Neck supple. No thyromegaly present.  Cardiovascular: Normal rate and regular rhythm. no murmurs Respiratory: Effort normal and breath sounds normal. No respiratory distress. occ rhonchi and upper airway sounds. No cough today GI: Bowel sounds are normal.  Abdominal wound is dressed . G-tube  site clean and dry  Skin: left AVG site clean, less tender, minimal edema Neurological: She is alert.  She is oriented to person place date of birth.  LUE 3/5 deltoid, bicep, tricep. 3+/5 wrist/hand. RUE grossly 3+ to 4/5. LE: 3 RHF, 2 LHF, 3+ RKE, 2 LKE, ankles grossly 3+ to 4-/5. Resting tremor in arms/legs less  apparent today. Inconsistent sensory exam.   cooperative. Attention, awareness, insight are improved. Psych: pleasant    Assessment/Plan: 1. 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 Physiatrist is providing close team supervision and 24 hour management of active medical problems listed below. Physiatrist and rehab team continue to assess barriers to discharge/monitor patient progress toward functional and medical goals. FIM:                   Comprehension Comprehension Mode: Auditory Comprehension: 5-Understands complex 90% of the time/Cues < 10% of the time  Expression Expression Mode: Verbal Expression: 5-Expresses complex 90% of the time/cues < 10% of the time  Social Interaction Social Interaction: 5-Interacts appropriately 90% of the time - Needs monitoring or encouragement for participation or interaction.  Problem Solving Problem Solving: 5-Solves complex 90% of the time/cues < 10% of the time  Memory Memory: 5-Recognizes or recalls 90% of the time/requires cueing < 10% of the time  Medical Problem List and Plan: 1. 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 status post loop recorder/respiratory failure/multi-medical 2. DVT Prophylaxis/Anticoagulation: SCD.  Venous Doppler studies negative 3. Pain Management: Oxycodone as needed. Fair control at present 4. End-stage renal disease. Left arm AV fistula 09/09/2014. Hemodialysis as per renal services 5. Neuropsych: This patient is capable of making decisions on her own behalf. 6. Skin/Wound Care: Routine skin checks/dressing changes as advised to abdominal wound with wet-to-dry 7. Fluids/Electrolytes/Nutrition: . Check labs Monday  -continuous TF on board overnight--wean off as PO improves 8. C. difficile. Patient remains on Flagyl---?duration 9. Hypertension/intermittent bouts of PSVT.  Lopressor 12.5 mg twice a day. 10. Massive pneumoperitoneum and ascites. Status post exploratory laparotomy and closure of perforated pyloric ulcer 07/01/2014. Dressing changes per Gen surg. 11. Decreased nutritional storage. Advance diet as tolerated. Status post gastrostomy tube 08/12/2014 per interventional radiology 12. Hypothyroidism. Synthroid 13. Mood/anxiety. Ativan as needed 14. Intermittent cough, "?asthma"---pulmicort neb prn LOS (Days) 1 A FACE TO FACE EVALUATION WAS PERFORMED  Adreana Coull T 09/12/2014 9:20 AM

## 2014-09-12 NOTE — Evaluation (Addendum)
Physical Therapy Assessment and Plan  Patient Details  Name: [name] MRN: [id number] Date of Birth: 1956/02/28  PT Diagnosis: Abnormal posture, Abnormality of gait, Coordination disorder, Difficulty walking, Hemiparesis non-dominant and Muscle weakness Rehab Potential: Good ELOS: 14-17 days    Today's Date: 09/12/2014 PT Individual Time: 0800-0900 PT Individual Time Calculation (min): 60 min    Problem List:  Patient Active Problem List   Diagnosis Date Noted  . Acute right ACA stroke 09/11/2014  . Left hemiparesis 09/11/2014  . History of ETT   . Duodenal fistula   . Sepsis   . Aortic aneurysm 08/13/2014  . Protein-calorie malnutrition   . Itching 08/12/2014  . Duodenal anastomotic leak   . Protein calorie malnutrition   . SVT (supraventricular tachycardia)   . Right heart failure   . ESRD (end stage renal disease)   . Abdominal pain   . Palliative care encounter   . 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 Fistula   . History of intermediate V/Q scan   . Other emphysema   . Intra-abdominal abscess   . Abnormal TSH   . Enteritis due to Clostridium difficile 07/19/2014  . Abdominal abscess   . Pelvic fluid collection   . Perforated gastric ulcer 07/01/2014  . SOB (shortness of breath)   . PJRT (permanent junctional reciprocating tachycardia)   . Paroxysmal atrial tachycardia   . PSVT (paroxysmal supraventricular tachycardia)   . Stroke   . Hyperlipidemia   . 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 Hypertension 06/20/2014  . ESRD needing dialysis 06/20/2014  . Obesity (BMI 30-39.9) 06/20/2014  . Depression 06/20/2014  . Left renal mass 06/20/2014  . Chronic diastolic heart failure 52/77/8242  . H1N1 influenza 06/20/2014  . Tobacco use disorder 06/20/2014  . ARF (acute renal failure)   . Renal failure (ARF), acute on chronic   . Acute respiratory failure with hypoxia 06/17/2014  . End stage renal disease 01/21/2014     Past Medical History:  Past Medical History  Diagnosis Date  . Asthma   . Hypertension   . Gout   . Arthritis   . Insomnia   . Chronic kidney disease   . Depression   . Renal insufficiency   . Aortic aneurysm without rupture     3cm by CT 08/03/14  . Paroxysmal SVT (supraventricular tachycardia)   . Cor pulmonale   . Stroke 06/2014  . PFO (patent foramen ovale)   . COPD (chronic obstructive pulmonary disease)    Past Surgical History:  Past Surgical History  Procedure Laterality Date  . Multiple tooth extractions    . Av fistula placement Left 01/28/2014    Procedure: ARTERIOVENOUS (AV) FISTULA CREATION;  Surgeon: Elam Dutch, MD;  Location: [facility];  Service: Vascular;  Laterality: Left;  . Tee without cardioversion N/A 06/23/2014    Procedure: TRANSESOPHAGEAL ECHOCARDIOGRAM (TEE);  Surgeon: Lelon Perla, MD;  Location: [facility];  Service: Cardiovascular;  Laterality: N/A;  . Insertion of dialysis catheter Right 06/27/2014    Procedure: INSERTION OF Right Internal Jugular DIATEK CATHETER;  Surgeon: Mal Misty, MD;  Location: [place];  Service: Vascular;  Laterality: Right;  . Laparotomy N/A 07/01/2014    Procedure: EXPLORATORY LAPAROTOMY WITH CLOSURE OF PERFORATED PYLORIC ULCER;  Surgeon: Fanny Skates, MD;  Location: [facility];  Service: General;  Laterality: N/A;  . Bowel resection N/A 07/01/2014    Procedure: SMALL BOWEL RESECTION;  Surgeon: Fanny Skates, MD;  Location: [facility];  Service: General;  Laterality: N/A;  . Revison of arteriovenous fistula Left 09/09/2014    Procedure: LIGATION OF COMPETING BRANCH, & RESECTION OF VENOUS ANEURYSM OF LEFT UPPER ARM ARTERIOVENOUS FISTULA;  Surgeon: Rosetta Posner, MD;  Location: [facility];  Service: Vascular;  Laterality: Left;    Assessment & Plan Clinical Impression: Patient is a 59 y.o. year old female with history of stage V chronic kidney disease status post fistula placement followed by nephrologist at [facility]  Sheppard Coil, hypertension, tobacco abuse. Independent prior to admission living with her daughter. [facility]ed 06/17/2014 to [facility] with progressive shortness of breath 2 weeks and lower extremity edema. By report patient has stopped taking her diuretics 2 weeks ago. Findings of elevated creatinine 5.5 baseline of 2.64. Also noted poor appetite related to symptoms of upper respiratory infection. Chest x-ray showed no infiltrate or failure. Renal ultrasound with no hydronephrosis. 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 Nephrology service follow-up placed on intravenous Lasix. Neurology consulted for left sided weakness. MRI of the brain 06/20/2014 shows multifocal areas of acute infarct throughout both hemispheres with the largest involving the right medial parasagittal frontal and posterior frontal cortex. MRA with no large vessel occlusion. Carotid Dopplers with no ICA stenosis. Echocardiogram with ejection fraction 65% and grade 1 diastolic dysfunction. Patient placed on full dose aspirin therapy. TEE performed showing normal LV function and no thrombus. Positive bubble study indicating a small PFO. Lower extremity Dopplers negative for DVT. Underwent placement of loop recorder 06/25/2014 per Dr. Caryl Comes. Patient with intermittent runs of PSVT placed on beta blocker. Renal function without change and hemodialysis initiated right internal jugular DIATEK catheter placed 06/27/2014 and later with placement of left arm AV fistula 09/09/2014. Developed acute abdominal pain 07/01/2014. CT scan showed massive pneumoperitoneum and ascites and also pneumatosis of a short segment of the ileum. General surgery consulted underwent exploratory laparotomy evacuation of ascites with closure of perforated pyloric ulcer with omental patch and segmental resection of ileum 07/01/2014 per Dr. Dalbert Batman. Patient required full ventilatory  support followed by critical care medicine. Wound care nurse consulted for need for VAC to abdominal wound. Placed on contact precautions for Clostridium difficile. Follow-up CT of abdomen showed no signs of abscess. Red rubber drain has been removed. Patient is currently nothing by mouth with interventional radiology placing gastrostomy tube 08/12/2014 and diet advanced to mechanical soft and still receiving tube feeds 12 hours.  Patient transferred to CIR on 09/11/2014 .   Patient currently requires mod with mobility secondary to muscle weakness, decreased cardiorespiratoy endurance and decreased oxygen support and impaired timing and sequencing, unbalanced muscle activation and decreased coordination.  Prior to hospitalization, patient was modified independent  with mobility and lived with Son, Daughter in a [place] home.  Home access is 12Stairs to enter.  Patient will benefit from skilled PT intervention to maximize safe functional mobility, minimize fall risk and decrease caregiver burden for planned discharge home with 24 hour supervision.  Anticipate patient will benefit from follow up [place] at discharge.  PT - End of Session Activity Tolerance: Tolerates < 10 min activity with changes in vital signs Endurance Deficit: Yes Endurance Deficit Description: Pt severely limited by decreased endurance and note that SaO2 dropped during gait and mobility.  PT Assessment Rehab Potential (ACUTE/IP ONLY): Good PT Patient demonstrates impairments in the following area(s): Balance;Endurance;Motor;Safety;Pain PT Transfers Functional Problem(s): Bed Mobility;Bed to Chair;Car;Furniture PT Locomotion Functional Problem(s): Ambulation;Wheelchair Mobility;Stairs PT  Plan PT Intensity: Minimum of 1-2 x/day ,45 to 90 minutes PT Frequency: 5 out of 7 days PT Duration Estimated Length of Stay: 14-17 days  PT Treatment/Interventions: Ambulation/gait training;Balance/vestibular training;Community  reintegration;Discharge planning;Disease management/prevention;DME/adaptive equipment instruction;Functional electrical stimulation;Functional mobility training;Neuromuscular re-education;Pain management;Patient/family education;Psychosocial support;Skin care/wound management;Splinting/orthotics;Stair training;Therapeutic Activities;Therapeutic Exercise;UE/LE Strength taining/ROM;UE/LE Coordination activities;Wheelchair propulsion/positioning PT Transfers Anticipated Outcome(s): S  PT Locomotion Anticipated Outcome(s): S at an ambulatory level  PT Recommendation Recommendations for Other Services: Neuropsych consult Follow Up Recommendations: Home health PT;24 hour supervision/assistance Equipment Recommended: To be determined  Pt destination: Home  Skilled Therapeutic Intervention PT assessment and evaluation completed, see full details below. Pt extremely limited by decreased endurance during session requiring multiple seated rest breaks with SaO2 dropping to 84% on 2LO2, therefore increased to 3LO2 during mobility.  Also note that she usually gets breathing treatments prior to session, therefore RN notified to call RT to come at end of session.  Discussed ELOS, goals and expected outcomes, equipment and rehab schedule.  Initiated education on successful sit<>stand due to poor forward weight shift with standing.  Will address stairs this afternoon.  Pt left in room in w/c with all needs in reach.   PT Evaluation Precautions/Restrictions Precautions Precautions: Fall Precaution Comments:  abdominal binder, abdominal wound Other Brace/Splint: abdominal binder Restrictions Weight Bearing Restrictions: No General   Vital SignsOxygen Therapy SpO2: 98 % O2 Device: Nasal Cannula O2 Flow Rate (L/min): 2 L/min Pain Pain Assessment Pain Assessment: No/denies pain Pain Score: 0-No pain Home Living/Prior Functioning Home Living Available Help at Discharge: Family;Available 24 hours/day Type of  Home: Apartment Home Access: Stairs to enter Entrance Stairs-Number of Steps: 12 Entrance Stairs-Rails: Right;Left (at some point, there are two rails, but starts as R rail) Home Layout: One level Additional Comments: tub shower, standard height toilets  Lives With: Son;Daughter Prior Function Level of Independence: Independent with basic ADLs;Independent with transfers Leisure: Hobbies-yes (Comment) (going out to eat) Comments: Family was assisting with meals and ensuring pt taking proper medication and bill management, would use a walker when having gout flare up Vision/Perception   See OT note Cognition Arousal/Alertness: Awake/alert Memory: Appears intact Sensation Sensation Light Touch: Appears Intact Stereognosis: Not tested Hot/Cold: Not tested Proprioception: Appears Intact Coordination Gross Motor Movements are Fluid and Coordinated: No Fine Motor Movements are Fluid and Coordinated: No Coordination and Movement Description: Pt with decreased strength and mobility in LLE/LUE Motor  Motor Motor: Hemiplegia;Other (comment) Motor - Skilled Clinical Observations: LUE/LE weakness, decreased balance, decreased endurance  Mobility Bed Mobility Bed Mobility: Supine to Sit;Sit to Supine Supine to Sit: 5: Supervision Supine to Sit Details: Verbal cues for sequencing;Verbal cues for precautions/safety;Verbal cues for technique Sit to Supine: 4: Min assist Sit to Supine - Details: Verbal cues for sequencing;Verbal cues for technique;Verbal cues for precautions/safety;Manual facilitation for placement Transfers Transfers: Yes Sit to Stand: 3: Mod assist Sit to Stand Details: Verbal cues for sequencing;Verbal cues for technique;Verbal cues for precautions/safety;Manual facilitation for weight shifting;Manual facilitation for placement;Manual facilitation for weight bearing Stand to Sit: 3: Mod assist Stand to Sit Details (indicate cue type and reason): Verbal cues for  sequencing;Verbal cues for technique;Verbal cues for precautions/safety;Manual facilitation for weight shifting;Manual facilitation for weight bearing;Manual facilitation for placement Stand Pivot Transfers: 3: Mod assist Stand Pivot Transfer Details: Verbal cues for sequencing;Verbal cues for technique;Verbal cues for precautions/safety;Manual facilitation for weight shifting Locomotion  Ambulation Ambulation: Yes Ambulation/Gait Assistance: 4: Min assist Ambulation Distance (Feet): 75 Feet Assistive device: Rolling walker Gait Gait: Yes  Gait Pattern: Impaired Gait Pattern: Poor foot clearance - left;Decreased stance time - left;Decreased stride length;Decreased weight shift to left;Shuffle;Trunk flexed Stairs / Additional Locomotion Stairs: No (will assess in pm session) Wheelchair Mobility Wheelchair Mobility: Yes Wheelchair Assistance: 4: Advertising account executive Details: Verbal cues for sequencing;Verbal cues for technique;Verbal cues for Information systems manager: Both upper extremities Wheelchair Parts Management: Needs assistance Distance: 50  Trunk/Postural Assessment  Cervical Assessment Cervical Assessment: Within Functional Limits Thoracic Assessment Thoracic Assessment: Within Functional Limits Lumbar Assessment Lumbar Assessment: Exceptions to [facility] Lumbar Strength Overall Lumbar Strength Comments: Pt with posterior pelvic tilt in sitting and standing.  Postural Control Postural Control: Deficits on evaluation Protective Responses: Pt with very delayed to no protective responses and relies on UEs for balance Postural Limitations: Pt with severe posterior lean with transitioning from sit<>stand.   Balance Balance Balance Assessed: Yes Static Sitting Balance Static Sitting - Balance Support: Feet supported Static Sitting - Level of Assistance: 5: Stand by assistance Dynamic Sitting Balance Dynamic Sitting - Balance Support: Feet  supported Dynamic Sitting - Level of Assistance: 4: Min assist Static Standing Balance Static Standing - Balance Support: During functional activity Static Standing - Level of Assistance: 4: Min assist Dynamic Standing Balance Dynamic Standing - Balance Support: During functional activity;Bilateral upper extremity supported Dynamic Standing - Level of Assistance: 4: Min assist Extremity Assessment      RLE Assessment RLE Assessment: Within Functional Limits (grossly WFL, 3+/5 overall) LLE Assessment LLE Assessment: Exceptions to [facility] LLE Strength LLE Overall Strength: Deficits LLE Overall Strength Comments: hip flex 2/5, knee ext 3+/5, knee flex 2/5, ankle DF 1/5, PF 2/5  FIM:  FIM - Bed/Chair Transfer Bed/Chair Transfer Assistive Devices: Adult nurse Transfer: 5: Supine > Sit: Supervision (verbal cues/safety issues);4: Sit > Supine: Min A (steadying pt. > 75%/lift 1 leg);4: Bed > Chair or W/C: Min A (steadying Pt. > 75%);4: Chair or W/C > Bed: Min A (steadying Pt. > 75%) FIM - Locomotion: Wheelchair Distance: 50 Locomotion: Wheelchair: 2: Travels 50 - 149 ft with minimal assistance (Pt.>75%) FIM - Locomotion: Ambulation Locomotion: Ambulation Assistive Devices: Administrator Ambulation/Gait Assistance: 4: Min assist Locomotion: Ambulation: 2: Travels 50 - 149 ft with minimal assistance (Pt.>75%) FIM - Locomotion: Stairs Locomotion: Stairs: 0: Activity did not occur   Refer to Care Plan for Long Term Goals  Recommendations for other services: Neuropsych  Discharge Criteria: Patient will be discharged from PT if patient refuses treatment 3 consecutive times without medical reason, if treatment goals not met, if there is a change in medical status, if patient makes no progress towards goals or if patient is discharged from hospital.  The above assessment, treatment plan, treatment alternatives and goals were discussed and mutually agreed upon: by patient  Denice Bors 09/12/2014, 12:33 PM

## 2014-09-12 NOTE — Progress Notes (Signed)
ID doctor called to consult about isolation precaution for 4W17. Patient has tested negative for CDiff. No new orders for contact precautions.

## 2014-09-13 ENCOUNTER — Inpatient Hospital Stay (HOSPITAL_COMMUNITY): Payer: Medicaid Other | Admitting: Occupational Therapy

## 2014-09-13 MED ORDER — VITAL AF 1.2 CAL PO LIQD
1000.0000 mL | ORAL | Status: DC
Start: 2014-09-13 — End: 2014-09-15
  Administered 2014-09-13: 1000 mL
  Filled 2014-09-13 (×3): qty 1000

## 2014-09-13 NOTE — Progress Notes (Signed)
Occupational Therapy Session Note  Patient Details  Name: Rosita C Carradine MRN: [id number] Date of Birth: 07-09-1955  Today's Date: 09/13/2014 OT Individual Time: 1350-1435 OT Individual Time Calculation (min): 45 min    Short Term Goals: Week 1:  OT Short Term Goal 1 (Week 1): Pt will engage in 10 minutes of functional activity without O2 stats dropping below 90% while on 2L of O2 or less. OT Short Term Goal 2 (Week 1): Pt will perform toilet transfer onto standard toilet with min A in order to increase functional transfers. OT Short Term Goal 3 (Week 1): When bathing at sink, pt will perform with min A balance during STS. OT Short Term Goal 4 (Week 1): Pt will perform LB dressing with Mod A in order to decrease level of assistance for self care.  Skilled Therapeutic Interventions/Progress Updates:    Engaged in therapeutic activity with focus on activity tolerance, sit <> stand, and standing tolerance. Pt received in w/c ready to participate in therapy session.  In Dayroom engaged in standing activity with focus on sit <> stand and standing balance as well as overall activity tolerance.  Pt able to maintain standing 3-5 mins each time prior to requiring seated rest break, stood 4x.  Pt's O2 sats would drop to 90% on 2L but would return to mid 90s with rest and focus on breathing.  Pt required mod assist (lifting) with sit > stand due to decreased forward weight shift, therapist providing tactile cues at trunk and buttocks to promote forward weight shift and lift off with each sit > stand.  Therapy Documentation Precautions:  Precautions Precautions: Fall Precaution Comments:  abdominal binder, abdominal wound Other Brace/Splint: abdominal binder Restrictions Weight Bearing Restrictions: No Pain: Pain Assessment Pain Assessment: 0-10 Pain Score: 4  Pain Type: Acute pain Pain Location: Arm Pain Orientation: Left Pain Descriptors / Indicators: Throbbing Pain Onset: Gradual Pain  Intervention(s): Medication (See eMAR)  See FIM for current functional status  Therapy/Group: Individual Therapy  Rosalio Loud 09/13/2014, 2:52 PM

## 2014-09-13 NOTE — Progress Notes (Signed)
[facility]     PROGRESS NOTE    Subjective/Complaints: Made it through therapy fairly well without setbacks. HD uneventful last night. Minimal coughing or "asthma" attacks. Appetite better but not overwhelming ROS: Pt denies fever, rash/itching, headache, blurred or double vision, nausea, vomiting, abdominal pain, diarrhea, chest pain, palpitations, dysuria, dizziness, neck or back pain, bleeding, anxiety, or depression   Objective: Vital Signs: Blood pressure 99/64, pulse 99, temperature 98 F (36.7 C), temperature source Oral, resp. rate 17, height 5\' 7"  (1.702 m), weight 96.4 kg (212 lb 8.4 oz), SpO2 98 %. No results found.  Recent Labs  09/12/14 1700  WBC 10.0  HGB 9.0*  HCT 27.7*  PLT 370    Recent Labs  09/12/14 1700  NA 125*  K 4.5  CL 88*  GLUCOSE 82  BUN 51*  CREATININE 4.66*  CALCIUM 8.3*   CBG (last 3)  No results for input(s): GLUCAP in the last 72 hours.  Wt Readings from Last 3 Encounters:  09/12/14 96.4 kg (212 lb 8.4 oz)  09/10/14 98.975 kg (218 lb 3.2 oz)  06/01/14 102.059 kg (225 lb)    Physical Exam:  Gen: alert, no distress, obese HENT:  Poor dentition,  Eyes: EOM are normal.  Neck: Normal range of motion. Neck supple. No thyromegaly present.  Cardiovascular: Normal rate and regular rhythm. no murmurs Respiratory: Effort normal and breath sounds normal. No respiratory distress. occ rhonchi and upper airway sounds. No cough today GI: Bowel sounds are normal.  Abdominal wound is dressed . G-tube site clean and dry  Skin: left AVG site clean, less tender, minimal edema Neurological: She is alert.  She is oriented to person place date of birth.  LUE 3/5 deltoid, bicep, tricep. 3+/5 wrist/hand. RUE grossly 3+ to 4/5. LE: 3 RHF, 2 LHF, 3+ RKE, 2 LKE, ankles grossly 3+ to 4-/5. Resting tremor in arms/legs less apparent today. Inconsistent sensory exam.   cooperative. Attention, awareness, insight are  quite improved Psych: pleasant    Assessment/Plan: 1. 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 Physiatrist is providing close team supervision and 24 hour management of active medical problems listed below. Physiatrist and rehab team continue to assess barriers to discharge/monitor patient progress toward functional and medical goals. FIM: FIM - Bathing Bathing Steps Patient Completed: Chest, Right Arm, Left Arm, Abdomen, Right upper leg, Left upper leg, Front perineal area Bathing: 3: Mod-Patient completes 5-7 57f 10 parts or 50-74%  FIM - Upper Body Dressing/Undressing Upper body dressing/undressing: 0: Wears gown/pajamas-no public clothing FIM - Lower Body Dressing/Undressing Lower body dressing/undressing: 0: Wears Oceanographer  FIM - Toileting Toileting steps completed by patient: Performs perineal hygiene Toileting Assistive Devices: Grab bar or rail for support Toileting: 1: Total-Patient completed zero steps, helper did all 3  FIM - Diplomatic Services operational officer Devices: Arts development officer, Art gallery manager Transfers: 3-To toilet/BSC: Mod A (lift or lower assist), 3-From toilet/BSC: Mod A (lift or lower assist)  FIM - Banker Devices: Manufacturing systems engineer Transfer: 5: Supine > Sit: Supervision (verbal cues/safety issues), 4: Sit > Supine: Min A (steadying pt. > 75%/lift 1 leg), 4: Bed > Chair or W/C: Min A (steadying Pt. > 75%), 4: Chair or W/C > Bed: Min A (steadying Pt. > 75%)  FIM - Locomotion: Wheelchair Distance: 50 Locomotion: Wheelchair: 2: Travels 50 - 149 ft with minimal assistance (  Pt.>75%) FIM - Locomotion: Ambulation Locomotion: Ambulation Assistive Devices: Walker - Rolling Ambulation/Gait Assistance: 4: Min assist Locomotion: Ambulation: 2: Travels 50 - 149 ft with minimal assistance  (Pt.>75%)  Comprehension Comprehension Mode: Auditory Comprehension: 5-Understands complex 90% of the time/Cues < 10% of the time  Expression Expression Mode: Verbal Expression: 5-Expresses basic needs/ideas: With no assist  Social Interaction Social Interaction: 5-Interacts appropriately 90% of the time - Needs monitoring or encouragement for participation or interaction.  Problem Solving Problem Solving: 5-Solves complex 90% of the time/cues < 10% of the time  Memory Memory: 5-Recognizes or recalls 90% of the time/requires cueing < 10% of the time  Medical Problem List and Plan: 1. 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 status post loop recorder/respiratory failure/multi-medical  -tolerating therapies thus far 2. DVT Prophylaxis/Anticoagulation: SCD.  Venous Doppler studies negative 3. Pain Management: Oxycodone as needed. Fair control at present 4. End-stage renal disease. Left arm AV fistula 09/09/2014. Hemodialysis as per renal services 5. Neuropsych: This patient is capable of making decisions on her own behalf. 6. Skin/Wound Care: Routine skin checks/dressing changes as advised to abdominal wound with wet-to-dry 7. Fluids/Electrolytes/Nutrition: . Check labs Monday  -continuous TF on board overnight--will cut in half to help increase appetite 8. Hx of C diff: placed on flagyl for one week given use of abx during this hospitalization 9. Hypertension/intermittent bouts of PSVT. Lopressor 12.5 mg twice a day--rate controlled. 10. Massive pneumoperitoneum and ascites. Status post exploratory laparotomy and closure of perforated pyloric ulcer 07/01/2014.  -maintain dressing changes per Gen surg. 11. Decreased nutritional storage. Advance diet as tolerated. Status post gastrostomy tube 08/12/2014 per interventional radiology 12. Hypothyroidism. Synthroid 13. Mood/anxiety. Ativan as needed 14. Intermittent cough, "?asthma"---pulmicort neb prn effective. May be anxiety  component also  LOS (Days) 2 A FACE TO FACE EVALUATION WAS PERFORMED  Sarie Stall T 09/13/2014 9:46 AM

## 2014-09-13 NOTE — Progress Notes (Signed)
Subjective:  No current complaints, dialysis uneventful last night, slept through most of it  Objective: Vital signs in last 24 hours: Temp:  [97.5 F (36.4 C)-98 F (36.7 C)] 98 F (36.7 C) (06/12 0555) Pulse Rate:  [48-100] 99 (06/12 0555) Resp:  [16-18] 17 (06/12 0555) BP: (99-162)/(64-86) 99/64 mmHg (06/12 0555) SpO2:  [98 %-100 %] 98 % (06/12 0555) Weight:  [96.4 kg (212 lb 8.4 oz)-98.9 kg (218 lb 0.6 oz)] 96.4 kg (212 lb 8.4 oz) (06/11 2100) Weight change: 1.1 kg (2 lb 6.8 oz)  Intake/Output from previous day: 06/11 0701 - 06/12 0700 In: 360 [P.O.:360] Out: 1950  Intake/Output this shift: Total I/O In: 120 [P.O.:120] Out: -   Lab Results:  Recent Labs  09/12/14 1700  WBC 10.0  HGB 9.0*  HCT 27.7*  PLT 370   BMET:  Recent Labs  09/12/14 1700  NA 125*  K 4.5  CL 88*  CO2 21*  GLUCOSE 82  BUN 51*  CREATININE 4.66*  CALCIUM 8.3*  ALBUMIN 2.4*   No results for input(s): PTH in the last 72 hours. Iron Studies: No results for input(s): IRON, TIBC, TRANSFERRIN, FERRITIN in the last 72 hours.  Studies/Results: No results found.   EXAM: General appearance: Alert, in no apparent distress Resp: Scattered expiratory wheezes with improving bibasilar rales  Cardio: RRR with Gr II/VI systolic murmur, no rub GI: Binders, PEG, RUQ drain, mild L-side tenderness Extremities: 1+ LE edema, 2+ pre-sacral edema Access: R IJ catheter, AVF @ L[facility] with + bruit Low SNa pre HD  Assessment/Plan: 1. Perforated pyloric ulcer / abdominal abscess - s/p repair with partial ileum resection 3/30, post-op ileus, with resolving enterocutaneous fistula, per surgery, moved to rehab 6/10. 2. ESRD - new start to HD, on TTS, using R IJ catheter; AVF placed 01/28/14 by Dr. Darrick Penna, s/p revision 6/8 per Dr. Arbie Cookey, may use in 2 wks. Low SNa indic dilution, mobilizing xs vol 3. Hypotension/Volume - BP 99/64, no meds; wt 96.4 kg s/p UF goal 2 L yesterday.  Continue to lower EDW.will help  SNa 4. Anemia - Hgb 9, Aranesp 200 mcg on Thurs, started Fe 6/9. 5. 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 6. Nutrition - Alb 2.1, soft diet, vitamin.  7. C diff colitis - now negative. 8. Severe anxiety - Xanax pre-HD, may need psych input and longer acting anxieolytic. 9. CVA    LOS: 2 days   LYLES,CHARLES 09/13/2014,8:58 AM I have seen and examined this patient and agree with the plan of care seen, eval, examined . Counseled patient on goals .  Licia Harl L 09/13/2014, 9:39 AM

## 2014-09-14 ENCOUNTER — Inpatient Hospital Stay (HOSPITAL_COMMUNITY): Payer: Medicaid Other

## 2014-09-14 ENCOUNTER — Inpatient Hospital Stay (HOSPITAL_COMMUNITY): Payer: Medicaid Other | Admitting: Physical Therapy

## 2014-09-14 LAB — GLUCOSE, CAPILLARY
Glucose-Capillary: 83 mg/dL (ref 65–99)
Glucose-Capillary: 101 mg/dL — ABNORMAL HIGH (ref 65–99)

## 2014-09-14 MED ORDER — VITAMIN C 500 MG/5ML PO SYRP
250.0000 mg | ORAL_SOLUTION | Freq: Every day | ORAL | Status: DC
  Administered 2014-09-14 – 2014-09-15 (×2): 250 mg via ORAL
  Filled 2014-09-14 (×3): qty 2.5

## 2014-09-14 MED ORDER — JEVITY 1.2 CAL PO LIQD: 1000.0000 mL | ORAL | Status: DC

## 2014-09-14 MED ORDER — BUDESONIDE 0.25 MG/2ML IN SUSP
0.2500 mg | Freq: Two times a day (BID) | RESPIRATORY_TRACT | Status: DC
  Administered 2014-09-14 – 2014-10-02 (×28): 0.25 mg via RESPIRATORY_TRACT
  Filled 2014-09-14 (×45): qty 2

## 2014-09-14 MED ORDER — CITALOPRAM HYDROBROMIDE 20 MG PO TABS
20.0000 mg | ORAL_TABLET | Freq: Every day | ORAL | Status: DC
  Administered 2014-09-14 – 2014-10-01 (×15): 20 mg via ORAL
  Filled 2014-09-14 (×25): qty 1

## 2014-09-14 MED ORDER — BOOST / RESOURCE BREEZE PO LIQD
1.0000 | Freq: Two times a day (BID) | ORAL | Status: DC
  Administered 2014-09-14 – 2014-09-23 (×2): 1 via ORAL

## 2014-09-14 MED ORDER — PRO-STAT SUGAR FREE PO LIQD
30.0000 mL | Freq: Two times a day (BID) | ORAL | Status: DC
  Administered 2014-09-14 – 2014-09-17 (×6): 30 mL
  Filled 2014-09-14 (×8): qty 30

## 2014-09-14 MED ORDER — NEPRO/CARBSTEADY PO LIQD
237.0000 mL | Freq: Every day | ORAL | Status: DC
  Administered 2014-09-15 – 2014-09-24 (×3): 237 mL via ORAL

## 2014-09-14 NOTE — Progress Notes (Signed)
Stephanie Oyster, MD Physician Signed Physical Medicine and Rehabilitation Consult Note 08/17/2014 4:26 AM  Related encounter: ED to Hosp-Admission (Discharged) from 06/17/2014 in [facility] 6E MEDICAL/RENAL    Expand All Collapse All        Physical Medicine and Rehabilitation Consult Reason for Consult: Acute infarct right anterior cerebral artery territory/multi-medical Referring Physician: Triad   HPI: [name] is a 59 y.o. right handed female with history of stage V chronic kidney disease status post fistula placement followed by nephrologist at [facility] [name], hypertension, tobacco abuse. Independent prior to admission living with her daughter. Presented 06/17/2014 to [facility] with progressive shortness of breath 2 weeks and lower extremity edema. By report patient has stopped taking her diuretics 2 weeks ago. Findings of elevated creatinine 5.5 baseline of 2.64. Also noted poor appetite related to symptoms of upper respiratory infection. Chest x-ray showed no infiltrate or failure. Renal ultrasound with no hydronephrosis. 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 Nephrology service follow-up placed on intravenous Lasix. Neurology consulted for left sided weakness. MRI of the brain 06/20/2014 shows multifocal areas of acute infarct throughout both hemispheres with the largest involving the right medial parasagittal frontal and posterior frontal cortex. MRA with no large vessel occlusion. Carotid Dopplers with no ICA stenosis. Echocardiogram with ejection fraction 65% and grade 1 diastolic dysfunction. Patient placed on full dose aspirin therapy. TEE performed showing normal LV function and no thrombus. Positive bubble study indicating a small PFO. Lower extremity Dopplers negative for DVT. Underwent placement of loop recorder 06/25/2014 per Dr. Graciela Husbands. Patient  with intermittent runs of PSVT placed on beta blocker. Renal function without change the last dialysis was initiated with right internal jugular DIATEK catheter placed 06/27/2014. Developed acute abdominal pain 07/01/2014. CT scan showed massive pneumoperitoneum and ascites and also pneumatosis of a short segment of the ileum. General surgery consulted underwent exploratory laparotomy evacuation of ascites with closure of perforated pyloric ulcer with omental patch and segmental resection of ileum 07/01/2014 per Dr. Derrell Lolling. Patient required full ventilatory support followed by critical care medicine. Wound care nurse consulted for need for VAC to abdominal wound. Placed on contact precautions for Clostridium difficile. Patient is currently nothing by mouth with nasogastric tube feeds as well as TPN. Therapies initiated slow overall progress. M.D. has requested physical medicine rehabilitation consult.   Review of Systems  Respiratory: Positive for shortness of breath.  Cardiovascular: Positive for leg swelling.  Gastrointestinal: Positive for constipation.  Musculoskeletal: Positive for myalgias.  Psychiatric/Behavioral: The patient has insomnia.  All other systems reviewed and are negative.  Past Medical History  Diagnosis Date  . Asthma   . Hypertension   . Gout   . Arthritis   . Insomnia   . Chronic kidney disease   . Depression   . Renal insufficiency   . Aortic aneurysm without rupture     3cm by CT 08/03/14  . Paroxysmal SVT (supraventricular tachycardia)   . Cor pulmonale   . Stroke 06/2014  . PFO (patent foramen ovale)   . COPD (chronic obstructive pulmonary disease)    Past Surgical History  Procedure Laterality Date  . Multiple tooth extractions    . Av fistula placement Left 01/28/2014    Procedure: ARTERIOVENOUS (AV) FISTULA CREATION; Surgeon: Sherren Kerns, MD; Location: [facility] OR; Service: Vascular;  Laterality: Left;  . Tee without cardioversion N/A 06/23/2014    Procedure: TRANSESOPHAGEAL ECHOCARDIOGRAM (  TEE); Surgeon: Lewayne Bunting, MD; Location: [facility] ENDOSCOPY; Service: Cardiovascular; Laterality: N/A;  . Insertion of dialysis catheter Right 06/27/2014    Procedure: INSERTION OF Right Internal Jugular DIATEK CATHETER; Surgeon: Pryor Ochoa, MD; Location: [facility] OR; Service: Vascular; Laterality: Right;  . Laparotomy N/A 07/01/2014    Procedure: EXPLORATORY LAPAROTOMY WITH CLOSURE OF PERFORATED PYLORIC ULCER; Surgeon: Claud Kelp, MD; Location: [facility] OR; Service: General; Laterality: N/A;  . Bowel resection N/A 07/01/2014    Procedure: SMALL BOWEL RESECTION; Surgeon: Claud Kelp, MD; Location: [facility] OR; Service: General; Laterality: N/A;   Family History  Problem Relation Age of Onset  . Diabetes Mother   . Hypertension Mother   . Heart disease Mother   . Heart attack Mother   . Peripheral vascular disease Mother   . Diabetes Father   . Heart disease Father   . Hypertension Father   . Diabetes Sister   . Hypertension Sister   . Heart disease Sister     before age 6  . Heart attack Sister   . Peripheral vascular disease Sister   . Heart disease Brother   . Hyperlipidemia Daughter    Social History:  reports that she has been smoking Cigarettes. She has a 10 pack-year smoking history. She has never used smokeless tobacco. She reports that she drinks alcohol. She reports that she does not use illicit drugs. Allergies: No Known Allergies Medications Prior to Admission  Medication Sig Dispense Refill  . allopurinol (ZYLOPRIM) 100 MG tablet Take 100 mg by mouth 2 (two) times daily as needed (for gout).    . citalopram (CELEXA) 20 MG tablet Take 20 mg by mouth daily.    . furosemide (LASIX) 80 MG tablet Take 80 mg by mouth daily.    . hydrOXYzine  (ATARAX/VISTARIL) 25 MG tablet Take 25 mg by mouth 3 (three) times daily as needed for anxiety.    Marland Kitchen oxyCODONE (ROXICODONE) 5 MG immediate release tablet Take 1 tablet (5 mg total) by mouth every 6 (six) hours as needed for severe pain. (Patient not taking: Reported on 06/17/2014) 15 tablet 0    Home: Home Living Family/patient expects to be discharged to:: Unsure Living Arrangements: Children Available Help at Discharge: Family Type of Home: Apartment Home Access: Stairs to enter Secretary/administrator of Steps: 12 Home Layout: Able to live on main level with bedroom/bathroom Home Equipment: None Additional Comments: tub shower, standard height toilets  Functional History: Prior Function Level of Independence: Independent Functional Status:  Mobility: Bed Mobility Overal bed mobility: Needs Assistance Bed Mobility: Sit to Supine, Rolling, Sidelying to Sit Rolling: Min assist Sidelying to sit: Min assist Supine to sit: Mod assist Sit to supine: Mod assist Sit to sidelying: +2 for physical assistance, Max assist General bed mobility comments: Min A to roll to Right side and elevate trunk off bed. Verbal encouragement to complete. Pt required mod A to return to supine.  Transfers Overall transfer level: Needs assistance Equipment used: Rolling walker (2 wheeled) Transfers: Sit to/from Stand Sit to Stand: Mod assist, From elevated surface (min +2) Stand pivot transfers: Mod assist, +2 physical assistance, +2 safety/equipment Squat pivot transfers: Mod assist, +2 safety/equipment General transfer comment: not addressed due to fatigue Ambulation/Gait Ambulation/Gait assistance: Max assist, +2 safety/equipment Ambulation Distance (Feet): 2 Feet (forward and back ) Assistive device: Rolling walker (2 wheeled) General Gait Details: significant assist for w/shift and stability, help control advance of the L LE/ gait hemiparetic in nature Gait Pattern/deviations: Step-to  pattern  ADL: ADL Overall ADL's : Needs assistance/impaired Eating/Feeding: NPO Eating/Feeding Details (indicate cue type and reason): reports not feeling well and not eating lunch currently Grooming: Wash/dry hands, Wash/dry face, Set up, Sitting Grooming Details (indicate cue type and reason): min assist to sit EOB while doing simple grooming tasks. Pt stated she was very tired and wanting to lay down. Upper Body Bathing: Minimal assitance, Sitting Upper Body Bathing Details (indicate cue type and reason): encouragement to complete task Lower Body Bathing: Total assistance, Bed level Lower Body Bathing Details (indicate cue type and reason): pt participated minimally Upper Body Dressing : Moderate assistance, Sitting Lower Body Dressing: Total assistance, Bed level Lower Body Dressing Details (indicate cue type and reason): with use of stedy Toilet Transfer: Minimal assistance, +2 for physical assistance, Stand-pivot, BSC Toilet Transfer Details (indicate cue type and reason): Pt required assist to prevent Rt knee from buckling and and assist to advance LEs to pivot  Toileting- Clothing Manipulation and Hygiene: Total assistance Toileting - Clothing Manipulation Details (indicate cue type and reason): pt able to perform pericare in sitting. Max A to stand with use of stedy Functional mobility during ADLs: Minimal assistance, +2 for physical assistance General ADL Comments: Pt sat EOB ~20 minutes to facilitate trunk control. Pt has good static sitting balance and promoted dynamic balance through reaching to apply lotion to LEs, weight shift without UE support, and trunk rotation. Pt participated in scapular mobilization to promote full ROM and functional UE use.   Cognition: Cognition Overall Cognitive Status: Within Functional Limits for tasks assessed Orientation Level: Oriented X4 Cognition Arousal/Alertness: Awake/alert Behavior During Therapy: Flat affect Overall Cognitive  Status: Within Functional Limits for tasks assessed Area of Impairment: (mild attention deficit) Current Attention Level: Selective General Comments: Pt able to follow one and two step commands consistently. Cognition appears [facility] for basic tasks   Blood pressure 92/59, pulse 101, temperature 97.9 F (36.6 C), temperature source Oral, resp. rate 16, height 5\' 6"  (1.676 m), weight 96 kg (211 lb 10.3 oz), SpO2 100 %. Physical Exam  HENT:  Poor dentition,  Eyes: EOM are normal.  Neck: Normal range of motion. Neck supple. No thyromegaly present.  Cardiovascular: Normal rate and regular rhythm.  Respiratory: Effort normal and breath sounds normal. No respiratory distress.  GI: Bowel sounds are normal.  Abdominal wound is dressed  Neurological: She is alert.  She is oriented to person place date of birth. Fair awareness of deficits. Generalized weakness. 2-/5 deltoid, bicep, tricep. 3-/5 wrist/hand. LE: 1+ hf, 2-ke, 4-/5. Resting tremor in arms/legs. Inconsistent sensory exam. Very distracted.  Psychiatric:  Flat, cooperative. Poor attention, awareness, insight.     Lab Results Last 24 Hours    Results for orders placed or performed during the hospital encounter of 06/17/14 (from the past 24 hour(s))  Glucose, capillary Status: None   Collection Time: 08/16/14 6:33 AM  Result Value Ref Range   Glucose-Capillary 96 65 - 99 mg/dL  Troponin I (q 6hr x 3) Status: Abnormal   Collection Time: 08/16/14 6:41 AM  Result Value Ref Range   Troponin I 0.10 (H) <0.031 ng/mL  CBC Status: Abnormal   Collection Time: 08/16/14 6:41 AM  Result Value Ref Range   WBC 16.0 (H) 4.0 - 10.5 K/uL   RBC 3.12 (L) 3.87 - 5.11 MIL/uL   Hemoglobin 8.6 (L) 12.0 - 15.0 g/dL   HCT 51.1 (L) 02.1 - 11.7 %   MCV 90.4 78.0 - 100.0 fL   MCH 27.6 26.0 -  34.0 pg   MCHC 30.5 30.0 - 36.0 g/dL   RDW 16.1 (H) 09.6 - 04.5 %   Platelets 250  150 - 400 K/uL  Basic metabolic panel Status: Abnormal   Collection Time: 08/16/14 6:41 AM  Result Value Ref Range   Sodium 132 (L) 135 - 145 mmol/L   Potassium 4.4 3.5 - 5.1 mmol/L   Chloride 95 (L) 101 - 111 mmol/L   CO2 28 22 - 32 mmol/L   Glucose, Bld 127 (H) 65 - 99 mg/dL   BUN 45 (H) 6 - 20 mg/dL   Creatinine, Ser 4.09 (H) 0.44 - 1.00 mg/dL   Calcium 7.6 (L) 8.9 - 10.3 mg/dL   GFR calc non Af Amer 13 (L) >60 mL/min   GFR calc Af Amer 15 (L) >60 mL/min   Anion gap 9 5 - 15  Magnesium Status: None   Collection Time: 08/16/14 6:41 AM  Result Value Ref Range   Magnesium 2.2 1.7 - 2.4 mg/dL  Phosphorus Status: None   Collection Time: 08/16/14 6:41 AM  Result Value Ref Range   Phosphorus 4.0 2.5 - 4.6 mg/dL  Glucose, capillary Status: Abnormal   Collection Time: 08/16/14 8:32 AM  Result Value Ref Range   Glucose-Capillary 176 (H) 65 - 99 mg/dL  Glucose, capillary Status: Abnormal   Collection Time: 08/16/14 12:02 PM  Result Value Ref Range   Glucose-Capillary 155 (H) 65 - 99 mg/dL  Glucose, capillary Status: Abnormal   Collection Time: 08/16/14 6:03 PM  Result Value Ref Range   Glucose-Capillary 120 (H) 65 - 99 mg/dL  Glucose, capillary Status: Abnormal   Collection Time: 08/17/14 12:38 AM  Result Value Ref Range   Glucose-Capillary 156 (H) 65 - 99 mg/dL      Imaging Results (Last 48 hours)    Ct Abdomen Pelvis Wo Contrast  08/15/2014 CLINICAL DATA: Intra abdominal abscess. EXAM: CT ABDOMEN AND PELVIS WITHOUT CONTRAST TECHNIQUE: Multidetector CT imaging of the abdomen and pelvis was performed following the standard protocol without IV contrast. COMPARISON: CT scan of Aug 03, 2014. FINDINGS: Severe degenerative disc disease is noted at L5-S1. Minimal subsegmental atelectasis is noted posteriorly in the left lung base. No  gallstones are noted. No focal abnormality is noted in the liver, spleen or pancreas on these unenhanced images. Adrenal glands appear normal. Stable exophytic cyst is seen arising from midpole of left kidney. No hydronephrosis or renal obstruction is noted. No renal or ureteral calculi are noted. 3 cm infrarenal abdominal aortic aneurysm is noted. Gastrojejunostomy tube is seen entering stomach percutaneously and with distal tip in the jejunum. JP surgical drain is seen entering right upper quadrant of abdomen underneath the liver with distal tip in the left upper quadrant. No significant fluid collection is noted around the drain. Fluid collection is again identified and posterior cul-de-sac of pelvis measuring 5.1 x 3.2 cm. Calcified degenerating fibroid is noted in the uterus. Urinary bladder is unremarkable. There is no evidence of bowel obstruction. The appendix appears normal. No significant adenopathy is noted. Large midline surgical anterior abdominal wall incision is noted. IMPRESSION: Stable 3 cm infrarenal abdominal aortic aneurysm. No change in position of JP surgical drain with distal tip in the left upper quadrant of the abdomen. Interval placement of percutaneous gastrojejunostomy tube with distal tip in the jejunum. Grossly stable size and appearance of 5 cm fluid collection seen in posterior cul-de-sac of the pelvis. Continued presence uterine fibroids. Electronically Signed By: Lupita Raider, M.D. On: 08/15/2014 15:15  Dg Chest Port 1 View  08/15/2014 CLINICAL DATA: Shortness of breath. EXAM: PORTABLE CHEST - 1 VIEW COMPARISON: 08/13/2014 and multiple prior chest radiographs FINDINGS: Cardiomegaly and pulmonary vascular congestion again noted. A right IJ central venous catheter is noted with tips overlying the upper and mid SVC. A left central venous catheter is present with tip overlying the lower SVC. There is no evidence of focal airspace disease, pulmonary edema,  suspicious pulmonary nodule/mass, pleural effusion, or pneumothorax. No acute bony abnormalities are identified. IMPRESSION: Cardiomegaly with pulmonary vascular congestion. Electronically Signed By: Harmon Pier M.D. On: 08/15/2014 17:28     Assessment/Plan: Diagnosis: Right ACA infarct 1. Does the need for close, 24 hr/day medical supervision in concert with the patient's rehab needs make it unreasonable for this patient to be served in a less intensive setting? Yes 2. Co-Morbidities requiring supervision/potential complications: ESRD, cCHF, htn, obesity 3. Due to bladder management, bowel management, safety, skin/wound care, disease management, medication administration, pain management and patient education, does the patient require 24 hr/day rehab nursing? Yes 4. Does the patient require coordinated care of a physician, rehab nurse, PT (1-2 hrs/day, 5 days/week), OT (1-2 hrs/day, 5 days/week) and SLP (1-2 hrs/day, 5 days/week) to address physical and functional deficits in the context of the above medical diagnosis(es)? Yes Addressing deficits in the following areas: balance, endurance, locomotion, strength, transferring, bowel/bladder control, bathing, dressing, feeding, grooming, toileting, cognition, speech and psychosocial support 5. Can the patient actively participate in an intensive therapy program of at least 3 hrs of therapy per day at least 5 days per week? Yes 6. The potential for patient to make measurable gains while on inpatient rehab is good 7. Anticipated functional outcomes upon discharge from inpatient rehab are supervision and min assist with PT, supervision and min assist with OT, modified independent and supervision with SLP. 8. Estimated rehab length of stay to reach the above functional goals is: 18-22 days 9. Does the patient have adequate social supports and living environment to accommodate these discharge functional goals? Yes and Potentially 10. Anticipated D/C  setting: Home 11. Anticipated post D/C treatments: HH therapy 12. Overall Rehab/Functional Prognosis: excellent  RECOMMENDATIONS: This patient's condition is appropriate for continued rehabilitative care in the following setting: CIR Patient has agreed to participate in recommended program. Yes Note that insurance prior authorization may be required for reimbursement for recommended care.  Comment: Rehab Admissions Coordinator to follow up.  Thanks,  Stephanie Oyster, MD, [facility]     08/17/2014       Revision History     Date/Time User Provider Type Action   08/17/2014 9:31 AM Stephanie Oyster, MD Physician Sign   08/17/2014 5:57 AM Charlton Amor, PA-C Physician Assistant Pend   View Details Report       Routing History     Date/Time From To Method   08/17/2014 9:31 AM Stephanie Oyster, MD Stephanie Oyster, MD In Basket   08/17/2014 9:31 AM Stephanie Oyster, MD Gwenyth Bender, MD In Basket

## 2014-09-14 NOTE — IPOC Note (Signed)
Overall Plan of Care [facility]) Patient Details Name: [name] MRN: [id number] DOB: 1956-03-09  Admitting Diagnosis: R CVA  Hospital Problems: Principal Problem:   Acute right ACA stroke Active Problems:   End stage renal disease   Chronic diastolic heart failure   Enteritis due to Clostridium difficile   Left hemiparesis     Functional Problem List: Nursing Bowel, Edema, Nutrition, Pain, Safety, Skin Integrity  PT Balance, Endurance, Motor, Safety, Pain  OT Balance, Safety, Endurance, Motor  SLP    TR         Basic ADL's: OT Eating, Grooming, Bathing, Dressing, Toileting     Advanced  ADL's: OT  (n/a)     Transfers: PT Bed Mobility, Bed to Chair, Car, Occupational psychologist, Research scientist (life sciences): PT Ambulation, Psychologist, prison and probation services, Stairs     Additional Impairments: OT Fuctional Use of Upper Extremity  SLP        TR      Anticipated Outcomes Item Anticipated Outcome  Self Feeding set up   Swallowing      Basic self-care  overall supervision  Toileting  overall supervision   Bathroom Transfers overall supervision  Bowel/Bladder  Min A  Transfers  S   Locomotion  S at an ambulatory level   Communication     Cognition     Pain  <4  Safety/Judgment  Supervision   Therapy Plan: PT Intensity: Minimum of 1-2 x/day ,45 to 90 minutes PT Frequency: 5 out of 7 days PT Duration Estimated Length of Stay: 14-17 days  OT Intensity: Minimum of 1-2 x/day, 45 to 90 minutes OT Frequency: 5 out of 7 days OT Duration/Estimated Length of Stay: 14-17 days         Team Interventions: Nursing Interventions Bowel Management, Pain Management, Skin Care/Wound Management  PT interventions Ambulation/gait training, Warden/ranger, Community reintegration, Discharge planning, Disease management/prevention, DME/adaptive equipment instruction, Functional electrical stimulation, Functional mobility training, Neuromuscular re-education, Pain  management, Patient/family education, Psychosocial support, Skin care/wound management, Splinting/orthotics, Stair training, Therapeutic Activities, Therapeutic Exercise, UE/LE Strength taining/ROM, UE/LE Coordination activities, Wheelchair propulsion/positioning  OT Interventions Warden/ranger, Firefighter, Chief of Staff, Patient/family education, Self Care/advanced ADL retraining, Therapeutic Exercise, UE/LE Coordination activities, UE/LE Strength taining/ROM, Therapeutic Activities, Functional mobility training, DME/adaptive equipment instruction, Pain management, Discharge planning  SLP Interventions    TR Interventions    SW/CM Interventions      Team Discharge Planning: Destination: PT-  ,OT- Home , SLP-  Projected Follow-up: PT-Home health PT, 24 hour supervision/assistance, OT-  Outpatient OT, 24 hour supervision/assistance, SLP-  Projected Equipment Needs: PT-To be determined, OT- Tub/shower bench, 3 in 1 bedside comode, SLP-  Equipment Details: PT- , OT-  Patient/family involved in discharge planning: PT- Patient,  OT-Patient, SLP-   MD ELOS: 7-11d Medical Rehab Prognosis:  Good Assessment:59 y.o. right handed female with history of stage V chronic kidney disease status post fistula placement followed by nephrologist at [facility] [name], hypertension, tobacco abuse. Independent prior to admission living with her daughter. Presented 06/17/2014 to [facility] with progressive shortness of breath 2 weeks and lower extremity edema. By report patient has stopped taking her diuretics 2 weeks ago. Findings of elevated creatinine 5.5 baseline of 2.64. Also noted poor appetite related to symptoms of upper respiratory infection. Chest x-ray showed no infiltrate or failure. Renal ultrasound with no hydronephrosis. Reported some nonspecific left-sided weakness and cranial CT scan showed acute infarct right anterior cerebral artery  territory. Nephrology  service follow-up placed on intravenous Lasix. Neurology consulted for left sided weakness. MRI of the brain 06/20/2014 shows multifocal areas of acute infarct throughout both hemispheres with the largest involving the right medial parasagittal frontal and posterior frontal cortex. MRA with no large vessel occlusion. Carotid Dopplers with no ICA stenosis. Echocardiogram with ejection fraction 65% and grade 1 diastolic dysfunction. Patient placed on full dose aspirin therapy. TEE performed showing normal LV function and no thrombus. Positive bubble study indicating a small PFO. Lower extremity Dopplers negative for DVT. Underwent placement of loop recorder 06/25/2014 per Dr. Graciela Husbands. Patient with intermittent runs of PSVT placed on beta blocker. Renal function without change and hemodialysis initiated right internal jugular DIATEK catheter placed 06/27/2014 and later with placement of left arm AV fistula 09/09/2014. Developed acute abdominal pain 07/01/2014. CT scan showed massive pneumoperitoneum and ascites and also pneumatosis of a short segment of the ileum. General surgery consulted underwent exploratory laparotomy evacuation of ascites with closure of perforated pyloric ulcer with omental patch and segmental resection of ileum 07/01/2014 per Dr. Derrell Lolling    Now requiring 24/7 Rehab RN,MD, as well as CIR level PT, OT and SLP.  Treatment team will focus on ADLs and mobility with goals set at sup/minA  See Team Conference Notes for weekly updates to the plan of care

## 2014-09-14 NOTE — Progress Notes (Signed)
Social Work Assessment and Plan Social Work Assessment and Plan  Patient Details  Name: [name] MRN: [id number] Date of Birth: May 28, 1955  Today's Date: 09/14/2014  Problem List:  Patient Active Problem List   Diagnosis Date Noted  . Acute right ACA stroke 09/11/2014  . Left hemiparesis 09/11/2014  . History of ETT   . Duodenal fistula   . Sepsis   . Aortic aneurysm 08/13/2014  . Protein-calorie malnutrition   . Itching 08/12/2014  . Duodenal anastomotic leak   . Protein calorie malnutrition   . SVT (supraventricular tachycardia)   . Right heart failure   . ESRD (end stage renal disease)   . Abdominal pain   . Palliative care encounter   . 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 Fistula   . History of intermediate V/Q scan   . Other emphysema   . Intra-abdominal abscess   . Abnormal TSH   . Enteritis due to Clostridium difficile 07/19/2014  . Abdominal abscess   . Pelvic fluid collection   . Perforated gastric ulcer 07/01/2014  . SOB (shortness of breath)   . PJRT (permanent junctional reciprocating tachycardia)   . Paroxysmal atrial tachycardia   . PSVT (paroxysmal supraventricular tachycardia)   . Stroke   . Hyperlipidemia   . 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 Hypertension 06/20/2014  . ESRD needing dialysis 06/20/2014  . Obesity (BMI 30-39.9) 06/20/2014  . Depression 06/20/2014  . Left renal mass 06/20/2014  . Chronic diastolic heart failure 06/20/2014  . H1N1 influenza 06/20/2014  . Tobacco use disorder 06/20/2014  . ARF (acute renal failure)   . Renal failure (ARF), acute on chronic   . Acute respiratory failure with hypoxia 06/17/2014  . End stage renal disease 01/21/2014   Past Medical History:  Past Medical History  Diagnosis Date  . Asthma   . Hypertension   . Gout   . Arthritis   . Insomnia   . Chronic kidney disease   . Depression   . Renal insufficiency   . Aortic aneurysm without rupture      3cm by CT 08/03/14  . Paroxysmal SVT (supraventricular tachycardia)   . Cor pulmonale   . Stroke 06/2014  . PFO (patent foramen ovale)   . COPD (chronic obstructive pulmonary disease)    Past Surgical History:  Past Surgical History  Procedure Laterality Date  . Multiple tooth extractions    . Av fistula placement Left 01/28/2014    Procedure: ARTERIOVENOUS (AV) FISTULA CREATION;  Surgeon: Sherren Kerns, MD;  Location: [facility] OR;  Service: Vascular;  Laterality: Left;  . Tee without cardioversion N/A 06/23/2014    Procedure: TRANSESOPHAGEAL ECHOCARDIOGRAM (TEE);  Surgeon: Lewayne Bunting, MD;  Location: [facility] ENDOSCOPY;  Service: Cardiovascular;  Laterality: N/A;  . Insertion of dialysis catheter Right 06/27/2014    Procedure: INSERTION OF Right Internal Jugular DIATEK CATHETER;  Surgeon: Pryor Ochoa, MD;  Location: [facility] OR;  Service: Vascular;  Laterality: Right;  . Laparotomy N/A 07/01/2014    Procedure: EXPLORATORY LAPAROTOMY WITH CLOSURE OF PERFORATED PYLORIC ULCER;  Surgeon: Claud Kelp, MD;  Location: [facility] OR;  Service: General;  Laterality: N/A;  . Bowel resection N/A 07/01/2014    Procedure: SMALL BOWEL RESECTION;  Surgeon: Claud Kelp, MD;  Location: [facility] OR;  Service: General;  Laterality: N/A;  . Revison of arteriovenous fistula Left 09/09/2014    Procedure: LIGATION OF COMPETING BRANCH, & RESECTION OF VENOUS ANEURYSM OF LEFT UPPER ARM ARTERIOVENOUS FISTULA;  Surgeon:  Larina Earthly, MD;  Location: [facility] OR;  Service: Vascular;  Laterality: Left;   Social History:  reports that she has been smoking Cigarettes.  She has a 10 pack-year smoking history. She has never used smokeless tobacco. She reports that she drinks alcohol. She reports that she does not use illicit drugs.  Family / Support Systems Marital Status: Divorced Patient Roles: Parent, Other (Comment) (retiree) Children: Engineer, building services  [phone] Other Supports: Alos has son Anticipated Caregiver:  Children Ability/Limitations of Caregiver: Daughter works 2;30-11;00pm and son works 12;00-5;00 pm, so between them almost has 24 hr care Caregiver Availability: Other (Comment) (Almost has 24 hr care) Family Dynamics: Close knit family she and her two children, she has extended family and church members who are supportive and will assist once home. She is hopeful she can get to the level she can take care of herself.  Social History Preferred language: English Religion: Baptist Cultural Background: No issues Education: [employer] Read: Yes Write: Yes Employment Status: Disabled Fish farm manager Issues: No issues Guardian/Conservator: None-according to MD pt is capable of making her own decisions while here.   Abuse/Neglect Physical Abuse: Denies Verbal Abuse: Denies Sexual Abuse: Denies Exploitation of patient/patient's resources: Denies Self-Neglect: Denies  Emotional Status Pt's affect, behavior adn adjustment status: Pt is motivated and has always been one to take care of others and herself.  She is not used to being in this role nor does she like it. She is motivated to improve and regain her independence while here. She is glad to be here on rehab, wanting therapies. Recent Psychosocial Issues: Other health issues-thought they were being managed Pyschiatric History: History of depression and anxiety due to health issues-was getting help and taking meds.  She thought they were helping and is still on them. Would benefit form neruo-psych seeing while here. She is relying upon her faith to pull her through and feels she is doing ok at this time. Substance Abuse History: No issues  Patient / Family Perceptions, Expectations & Goals Pt/Family understanding of illness & functional limitations: Pt and daughter have a good understanding of her condition and treatment plan. She does talk wiht the MD and feels her questions are being addressed.  She is not shy to aks questions  if they arise. Premorbid pt/family roles/activities: Mother, grandmother, sister, retiree, church member, etc Anticipated changes in roles/activities/participation: resume Pt/family expectations/goals: Pt states; " I want to be able to take care of myself, before I leave here."  Daughter states: " We just want her well and medically ok, she scared [place] to death."  [employer]: None Premorbid Home Care/DME Agencies: None Transportation available at discharge: [employer] referrals recommended: Neuropsychology, Support group (specify)  Discharge Planning Living Arrangements: Children Support Systems: Children, Other relatives, Manufacturing engineer, Psychologist, clinical community Type of Residence: Private residence Insurance Resources: Medicaid (specify county) ([place]) Architect: SSD, Family Youth worker Screen Referred: No Living Expenses: Lives with family Money Management: Patient Does the patient have any problems obtaining your medications?: No Home Management: She and daughter Patient/Family Preliminary Plans: Return home with daughter who does work evenigns/nights-2;30-11;00 pm and son who works 5-6 hours a day.  If needed can get another family member to assist, will see how pt does. Pt is optimistic she will do well here.  Social Work Anticipated Follow Up Needs: HH/OP, Support Group  Clinical Impression Pleasant patient who is a survivor, she has been through much this hospitalization.  She feels her faith and God  has more work for her to do.  So she is ready to work and progress and return home with her children. Very supportive family who are willing to assist her. She is deconditioned from her long hospitalization and numerous surgeries.  Will work on discharge plan and provide support while here.  Lucy Chris 09/14/2014, 12:03 PM

## 2014-09-14 NOTE — Progress Notes (Signed)
Initial Nutrition Assessment  DOCUMENTATION CODES:  Obesity unspecified  INTERVENTION:  Discontinue calorie count.---results below.  Provide Resource Breeze po BID, each supplement provides 250 kcal and 9 grams of protein  Provide Nepro Shake po once daily., each supplement provides 425 kcal and 19 grams protein  Nocturnal TF regimen: Continue Vital AF 1.2 formula at 35 ml/hr x 12 hours (infuse 8 PM to 8 AM) with 30 ml Prostat BID via J-tube to provide 704 kcals, 62 gm protein, 340 ml of free water   RD to continue to monitor.   NUTRITION DIAGNOSIS:  Increased nutrient needs related to chronic illness as evidenced by estimated needs.  GOAL:  Patient will meet greater than or equal to 90% of their needs  MONITOR:  PO intake, Supplement acceptance, TF tolerance, Weight trends, Labs, I & O's  REASON FOR ASSESSMENT:  Consult Calorie Count  ASSESSMENT: Pt with history of stage V chronic kidney disease status post fistula placement now on HD, hypertension, tobacco abuse. Presented 06/17/2014 with progressive SOB 2 weeks and lower extremity edema. 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 CT scan showed massive pneumoperitoneum and ascites and also pneumatosis of a short segment of the ileum. Pt underwent exploratory laparotomy evacuation of ascites with closure of perforated pyloric ulcer with omental patch and segmental resection of ileum 07/01/2014. G-J tube placed 5/11.  Pt is currently on a soft diet. Meal completion has been varied from 0-75%. Pt reports her appetite has been improving. Pt currently has [employer] ordered and has been consuming them at times. Pt reports she is agreeable on trying Nepro shake. RD to order. Pt additionally has tube feedings running nocturnally (8pm-8am), which she is tolerating fine. Noted MD has modified orders for TF to run at half the rate at 35 ml/hr,  to aid/stimulate appetite during the day, providing 504 kcal, 32 grams of protein, 304 ml of free water. RD to order Prostat per tube to aid in adequate protein needs.   48 hour Calorie Count Results  Day 1: Breakfast: 50 kcal, 1 gram of protein Lunch: none recorded Dinner: 380 kcal, 20 grams of protein Supplements: 750 kcal, 27 grams of protein  Day 1:Total intake: 1180 kcal (56% of minimum estimated needs)  48 grams of protein (40% of minimum estimated needs)  Day 2: Breakfast: 172 kcal, 4 grams of protein Lunch: 300 kcal, 10 grams of protein Dinner: 350 kcal, 2 grams of protein Supplements: 250 kcal, 9 grams of protein  Day 2:Total intake: 1072 kcal (51% of minimum estimated needs)   25 grams of protein (21% of minimum estimated needs)  Pt has not been meeting her nutrition needs. RD to continue with current TF orders and nutritional supplements. Pt was encouraged to eat her food at meals and to consume her supplements.   Noted pt with no observed significant fat or muscle mass loss.   Labs and medications reviewed.   Height:  Ht Readings from Last 1 Encounters:  09/11/14  (1.702 m)    Weight:  Wt Readings from Last 1 Encounters:  09/14/14 220 lb 14.4 oz (100.2 kg)    Ideal Body Weight:  61 kg  Wt Readings from Last 10 Encounters:  09/14/14 220 lb 14.4 oz (100.2 kg)  09/10/14 218 lb 3.2 oz (98.975 kg)  06/01/14 225 lb (102.059 kg)  01/28/14 230 lb (104.327 kg)  01/21/14 231 lb 1.6 oz (104.826 kg)  10/02/12 216 lb  8 oz (98.204 kg)    BMI:  Body mass index is 34.59 kg/(m^2). Class I obesity  Estimated Nutritional Needs:  Kcal:  2100-2300  Protein:  120-140 grams  Fluid:  Per MD  Skin:   Stage I pressure ulcer on sacrum, incision on abdomen and arm, wound on abdomen. +1 LLE edema  Diet Order:  DIET SOFT Room service appropriate?: Yes; Fluid consistency:: Thin  EDUCATION NEEDS:  No education needs identified at this time   Intake/Output  Summary (Last 24 hours) at 09/14/14 1324 Last data filed at 09/14/14 0832  Gross per 24 hour  Intake    860 ml  Output      0 ml  Net    860 ml    Last BM:  6/12  Roslyn Smiling, MS, RD, LDN Pager # [phone] After hours/ weekend pager # [phone]

## 2014-09-14 NOTE — Progress Notes (Signed)
Physical Therapy Session Note  Patient Details  Name: [name] MRN: [id number] Date of Birth: March 25, 1956  Today's Date: 09/14/2014 PT Individual Time: 1305-1420 PT Individual Time Calculation (min): 75 min   Short Term Goals: Week 1:  PT Short Term Goal 1 (Week 1): Pt will perform sit<>stand with UE support at min A level  PT Short Term Goal 2 (Week 1): Pt will perform stand pivot transfers with LRAD at min A level  PT Short Term Goal 3 (Week 1): Pt will tolerate dynamic standing task for 2 mins with SaO2 remaining in the 90's.  PT Short Term Goal 4 (Week 1): Pt will ambulate x 65' w/ LRAD at S min/guard level with SaO2 in the 90's.    Skilled Therapeutic Interventions/Progress Updates:    Pt received seated in w/c with c/o pain in L abdomen as described below; agreeable to treatment. Pt on 2L O2 via [place] throughout session. Pt instructed in gait training for 3 trials x 15-20 ft using RW and minA/CGA. Sit<>stand requires minA and cues for L foot placement and forward trunk lean to improve weight shift over LEs. Following each trial pt requires extended seated rest break due to fatigue. During initial gait trial, O2 dropped to 82%, however returned to WNL within 10-20 seconds with cues to pt to perform pursed lip breathing. Later trials O2 remained WNL during and after gait. Pt instructed in seated LE therex due to extreme fatigue in standing. Performed 1 set 15 reps of each of the following on BLEs while sitting in w/c; hip flexion, knee extension, ankle pumps, seated hip adduction with pillow squeeze. Pt again requires significantly increased time to perform due to increased fatigue with all activity. Pt instructed in standing LE strengthening exercises for 2 sets of 15 reps heel raises; performed to improve blood flow, reduce edema, increase LE strength and standing tolerance for carryover into gait and adls. In standing pt instructed in standing marching with BLE with UE support on elevated bed  rail for stability. Pt encouraged to perform until fatigued; able to completed 90 sec marching prior to seated rest break. Marching x90 sec performed x3 trials with rest breaks between and increased fatigue with each trial. Demonstration and tactile cues provided during LLE stance phase to improve upright posture and trunk stability. Activity performed to improve standing tolerance and LE strength for carryover into gait and adls. Pt returned to w/c with all needs within reach.  Therapy Documentation Precautions:  Precautions Precautions: Fall Precaution Comments:  abdominal binder, abdominal wound, 2-3L 02 to maintain Sp02 >90% Other Brace/Splint: abdominal binder Restrictions Weight Bearing Restrictions: No  Pain: Pain Assessment Pain Assessment: Faces Faces Pain Scale: Hurts little more Pain Type: Acute pain Pain Location: Abdomen Pain Orientation: Left Pain Descriptors / Indicators: Aching;Sore Pain Onset: On-going Patients Stated Pain Goal: 0 Pain Intervention(s): Repositioned Multiple Pain Sites: No Locomotion : Ambulation Ambulation/Gait Assistance: 4: Min assist   See FIM for current functional status  Therapy/Group: Individual Therapy  [name] 09/14/2014, 3:35 PM

## 2014-09-14 NOTE — Progress Notes (Signed)
Subjective:  Sitting in chair , worried about sleeping poorly at night, insomnia, wants something to "help me sleep"  EXAM: General appearance: Alert, in no apparent distress Resp:  Scattered expiratory wheezes with bibasilar rales   Cardio: RRR with Gr II/VI systolic murmur, no rub GI: Binders, PEG, RUQ drain, mild L-side tenderness Extremities: 1+ bilat LE edema Access: R IJ catheter, AVF @ LUA with + bruit  Assessment/Plan: 1. Perforated pyloric ulcer / abdominal abscess - s/p repair with partial ileum resection 3/30, post-op ileus, with resolving enterocutaneous fistula, per surgery, moved to rehab , surg following 2. ESRD - new start to HD, on TTS, using R IJ catheter; AVF placed 01/28/14 by Dr. Darrick Penna, s/p revision 6/8 per Dr. Arbie Cookey, may use in 2 wks.  3. Hypotension/Volume - BP 126/80, vol excess 4. Anemia - Hgb 8.8, Aranesp 200 mcg on Thurs, started Fe 6/9. 5. Sec HPT - Ca 8 (9.5 corrected), P 8.2; Calcitriol 0.5 mcg, no binder. 6. Nutrition - Alb 2.1, soft diet, vitamin.  7. C diff colitis - now negative. 8. Anxiety - Xanax pre-HD, significant issue, needs psych help/ consult, have d/w primary 9. CVA  [name] MD (pgr) [phone]    ([phone] 09/14/2014, 3:12 PM  Objective: Vital signs in last 24 hours: Temp:  [97.6 F (36.4 C)-98.1 F (36.7 C)] 97.6 F (36.4 C) (06/13 1459) Pulse Rate:  [93-95] 93 (06/13 1459) Resp:  [17-19] 19 (06/13 1459) BP: (108-140)/(70-83) 140/83 mmHg (06/13 1459) SpO2:  [97 %-99 %] 98 % (06/13 1459) Weight:  [100.2 kg (220 lb 14.4 oz)] 100.2 kg (220 lb 14.4 oz) (06/13 0525) Weight change: 1.3 kg (2 lb 13.9 oz)  Intake/Output from previous day: 06/12 0701 - 06/13 0700 In: 840 [P.O.:840] Out: -  Intake/Output this shift: Total I/O In: 380 [P.O.:360; I.V.:20] Out: -   Lab Results:  Recent Labs  09/12/14 1700  WBC 10.0  HGB 9.0*  HCT 27.7*  PLT 370   BMET:   Recent Labs  09/12/14 1700  NA 125*  K 4.5  CL 88*   CO2 21*  GLUCOSE 82  BUN 51*  CREATININE 4.66*  CALCIUM 8.3*  ALBUMIN 2.4*   No results for input(s): PTH in the last 72 hours. Iron Studies: No results for input(s): IRON, TIBC, TRANSFERRIN, FERRITIN in the last 72 hours.  Studies/Results: No results found.  EXAM: General appearance: Alert, in no apparent distress Resp:  Scattered expiratory wheezes with bibasilar rales   Cardio: RRR with Gr II/VI systolic murmur, no rub GI: Binders, PEG, RUQ drain, mild L-side tenderness Extremities: 1+ bilat LE edema Access: R IJ catheter, AVF @ LUA with + bruit  Assessment/Plan: 10. Perforated pyloric ulcer / abdominal abscess - s/p repair with partial ileum resection 3/30, post-op ileus, with resolving enterocutaneous fistula, per surgery, moved to rehab , surg following 11. ESRD - new start to HD, on TTS, using R IJ catheter; AVF placed 01/28/14 by Dr. Darrick Penna, s/p revision 6/8 per Dr. Arbie Cookey, may use in 2 wks.  12. Hypotension/Volume - BP 126/80, no meds; wt 100.1 kg, UF goal 2-3 L today. 13. Anemia - Hgb 8.8, Aranesp 200 mcg on Thurs, started Fe 6/9. 14. Sec HPT - Ca 8 (9.5 corrected), P 8.2; Calcitriol 0.5 mcg, no binder. 15. Nutrition - Alb 2.1, soft diet, vitamin.  16. C diff colitis - now negative. 17. Anxiety - Xanax pre-HD, significant issue, needs psych help/ consult, have d/w primary 18. CVA  [name] MD (pgr)  [phone]    (c) [phone] 09/14/2014, 3:12 PM

## 2014-09-14 NOTE — Progress Notes (Signed)
[place] PHYSICAL MEDICINE & REHABILITATION     PROGRESS NOTE   CC weakness, I don';t want to use walker when I leave here Subjective/Complaints:  Pt denies joint pain, feels weakness mainly all over Occ abd pain Tolerating TF  ROS: Pt denies fever, rash/itching, headache, blurred or double vision, nausea, vomiting, abdominal pain, diarrhea, chest pain, palpitations, dysuria, dizziness, neck or back pain, bleeding, anxiety, or depression   Objective: Vital Signs: Blood pressure 108/70, pulse 95, temperature 98.1 F (36.7 C), temperature source Oral, resp. rate 17, height 5\' 7"  (1.702 m), weight 100.2 kg (220 lb 14.4 oz), SpO2 99 %. No results found.  Recent Labs  09/12/14 1700  WBC 10.0  HGB 9.0*  HCT 27.7*  PLT 370    Recent Labs  09/12/14 1700  NA 125*  K 4.5  CL 88*  GLUCOSE 82  BUN 51*  CREATININE 4.66*  CALCIUM 8.3*   CBG (last 3)  No results for input(s): GLUCAP in the last 72 hours.  Wt Readings from Last 3 Encounters:  09/14/14 100.2 kg (220 lb 14.4 oz)  09/10/14 98.975 kg (218 lb 3.2 oz)  06/01/14 102.059 kg (225 lb)    Physical Exam:  Gen: alert, no distress, obese HENT:  Poor dentition,  Eyes: EOM are normal.  Neck: Normal range of motion. Neck supple. No thyromegaly present.  Cardiovascular: Normal rate and regular rhythm. no murmurs Respiratory: Effort normal and breath sounds normal. No respiratory distress. occ rhonchi and upper airway sounds. No cough today GI: Bowel sounds are normal.  Abdominal wound is dressed . G-tube site clean and dry , no drainage Skin: left AVG site clean, less tender, minimal edema Neurological: She is alert.  She is oriented to person place date of birth.  LUE 4/5 deltoid, bicep, tricep. 4/5 wrist/hand. RUE grossly 4+/5. LE: 3 RHF, 3 LHF, 3+ RKE, 3 LKE, ankles grossly 3+ to 4-/5. Resting tremor in arms/legs less apparent today. Intact LT BLE.   cooperative. Attention, awareness, insight are fair Psych:  pleasant    Assessment/Plan: 1. 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 Physiatrist is providing close team supervision and 24 hour management of active medical problems listed below. Physiatrist and rehab team continue to assess barriers to discharge/monitor patient progress toward functional and medical goals. FIM: FIM - Bathing Bathing Steps Patient Completed: Chest, Right Arm, Left Arm, Abdomen, Right upper leg, Left upper leg, Front perineal area Bathing: 3: Mod-Patient completes 5-7 29f 10 parts or 50-74%  FIM - Upper Body Dressing/Undressing Upper body dressing/undressing: 0: Wears gown/pajamas-no public clothing FIM - Lower Body Dressing/Undressing Lower body dressing/undressing: 0: Wears Oceanographer  FIM - Toileting Toileting steps completed by patient: Performs perineal hygiene Toileting Assistive Devices: Grab bar or rail for support Toileting: 1: Total-Patient completed zero steps, helper did all 3  FIM - Diplomatic Services operational officer Devices: Arts development officer, Art gallery manager Transfers: 3-To toilet/BSC: Mod A (lift or lower assist), 4-From toilet/BSC: Min A (steadying Pt. > 75%)  FIM - Bed/Chair Transfer Bed/Chair Transfer Assistive Devices: Therapist, occupational: 3: Bed > Chair or W/C: Mod A (lift or lower assist), 3: Chair or W/C > Bed: Mod A (lift or lower assist)  FIM - Locomotion: Wheelchair Distance: 50 Locomotion: Wheelchair: 2: Travels 50 - 149 ft with minimal assistance (Pt.>75%) FIM - Locomotion: Ambulation Locomotion: Ambulation Assistive Devices: Designer, industrial/product Ambulation/Gait Assistance: 4: Min  assist Locomotion: Ambulation: 2: Travels 50 - 149 ft with minimal assistance (Pt.>75%)  Comprehension Comprehension Mode: Auditory Comprehension: 5-Follows basic conversation/direction: With no  assist  Expression Expression Mode: Verbal Expression: 5-Expresses basic needs/ideas: With no assist  Social Interaction Social Interaction: 5-Interacts appropriately 90% of the time - Needs monitoring or encouragement for participation or interaction.  Problem Solving Problem Solving: 5-Solves complex 90% of the time/cues < 10% of the time  Memory Memory: 5-Recognizes or recalls 90% of the time/requires cueing < 10% of the time  Medical Problem List and Plan: 1. 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 status post loop recorder/respiratory failure/multi-medical  -tolerating therapies thus far 2. DVT Prophylaxis/Anticoagulation: SCD.  Venous Doppler studies negative 3. Pain Management: Oxycodone as needed. Fair control at present 4. End-stage renal disease. Left arm AV fistula 09/09/2014. Hemodialysis as per renal services 5. Neuropsych: This patient is capable of making decisions on her own behalf. 6. Skin/Wound Care: Routine skin checks/dressing changes as advised to abdominal wound with wet-to-dry 7. Fluids/Electrolytes/Nutrition: . Check labs Monday  -continuous TF on board overnight--will cut in half to help increase appetite 8. Hx of C diff: placed on flagyl for one week given use of abx during this hospitalization 9. Hypertension/intermittent bouts of PSVT. Lopressor 12.5 mg twice a day--rate controlled. 10. Massive pneumoperitoneum and ascites. Status post exploratory laparotomy and closure of perforated pyloric ulcer 07/01/2014.  -maintain dressing changes per Gen surg. 11. Decreased nutritional storage. Advance diet as tolerated. Status post gastrostomy tube 08/12/2014 per interventional radiology 12. Hypothyroidism. Synthroid 13. Mood/anxiety. Ativan as needed 14. Intermittent cough, "?asthma"---pulmicort neb prn effective. May be anxiety component also 15.  LOS (Days) 3 A FACE TO FACE EVALUATION WAS PERFORMED  Erick Colace 09/14/2014 8:19 AM

## 2014-09-14 NOTE — Progress Notes (Signed)
Trish Mage, RN Rehab Admission Coordinator Signed Physical Medicine and Rehabilitation PMR Pre-admission 09/10/2014 12:32 PM  Related encounter: ED to Hosp-Admission (Discharged) from 06/17/2014 in [facility] 6E MEDICAL/RENAL    Expand All Collapse All   PMR Admission Coordinator Pre-Admission Assessment  Patient: [name] is an 59 y.o., female MRN: [id number] DOB: February 29, 1956 Height:  (167.6 cm) Weight:  (uta- pt in recliner)  Insurance Information HMO: No PPO: PCP: IPA: 80/20: OTHER:  PRIMARY: Medicaid [place] access Policy#: [id number] o Subscriber: Olivianna Sarabia CM Name: Phone#: Fax#:  Pre-Cert#: Employer: Not employed Benefits: Phone #: [phone] Name: Automated Eff. Date: 08/17/14 eligible Deduct: Out of Pocket Max: Life Max:  CIR: SNF:  Outpatient: Co-Pay:  Home Health: Co-Pay:  DME: Co-Pay:  Providers:   Emergency Contact Information Contact Information    Name Relation Home Work Mobile   Keatts,[name] Daughter [phone]       Current Medical History  Patient Admitting Diagnosis: R ACA infarct  History of Present Illness:A 59 y.o. right handed female with history of stage V chronic kidney disease status post fistula placement followed by nephrologist at [facility] [name], hypertension, tobacco abuse. Independent prior to admission living with her daughter. Presented 06/17/2014 to [facility] with progressive shortness of breath 2 weeks and lower extremity edema. By report patient had stopped taking her diuretics 2 weeks prior to admission. Findings of elevated creatinine 5.5 baseline of 2.64. Also noted poor appetite related to symptoms of  upper respiratory infection. Chest x-ray showed no infiltrate or failure. Renal ultrasound with no hydronephrosis. 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 Nephrology service follow-up placed on intravenous Lasix. Neurology consulted for left sided weakness. MRI of the brain 06/20/2014 shows multifocal areas of acute infarct throughout both hemispheres with the largest involving the right medial parasagittal frontal and posterior frontal cortex. MRA with no large vessel occlusion. Carotid Dopplers with no ICA stenosis. Echocardiogram with ejection fraction 65% and grade 1 diastolic dysfunction. Patient placed on full dose aspirin therapy. TEE performed showing normal LV function and no thrombus. Positive bubble study indicating a small PFO. Lower extremity Dopplers negative for DVT. Underwent placement of loop recorder 06/25/2014 per Dr. Graciela Husbands. Patient with intermittent runs of PSVT placed on beta blocker. Renal function without change and hemodialysis initiated right internal jugular DIATEK catheter placed 06/27/2014 and later with placement of left arm AV fistula 09/09/2014. Developed acute abdominal pain 07/01/2014. CT scan showed massive pneumoperitoneum and ascites and also pneumatosis of a short segment of the ileum. General surgery consulted underwent exploratory laparotomy evacuation of ascites with closure of perforated pyloric ulcer with omental patch and segmental resection of ileum 07/01/2014 per Dr. Derrell Lolling. Patient required full ventilatory support followed by critical care medicine. Wound care nurse consulted for need for VAC to abdominal wound. Placed on contact precautions for Clostridium difficile. Patient was NPO with interventional radiology placing gastrostomy tube 08/12/2014 as well as receiving TPN. Have discontinued TPN and diet advanced to mechanical soft. Therapies initiated slow overall progress. M.D.  has requested physical medicine rehabilitation. Patient to be admitted for a comprehensive inpatient rehabilitation program.  Past Medical History  Past Medical History  Diagnosis Date  . Asthma   . Hypertension   . Gout   . Arthritis   . Insomnia   . Chronic kidney disease   . Depression   . Renal insufficiency   . Aortic aneurysm without rupture  3cm by CT 08/03/14  . Paroxysmal SVT (supraventricular tachycardia)   . Cor pulmonale   . Stroke 06/2014  . PFO (patent foramen ovale)   . COPD (chronic obstructive pulmonary disease)     Family History  family history includes Diabetes in her father, mother, and sister; Heart attack in her mother and sister; Heart disease in her brother, father, mother, and sister; Hyperlipidemia in her daughter; Hypertension in her father, mother, and sister; Peripheral vascular disease in her mother and sister.  Prior Rehab/Hospitalizations: No previous rehab admissions  Has the patient had major surgery during 100 days prior to admission? No. However has had surgeries this admission.  Current Medications   Current facility-administered medications:  . 0.9 % sodium chloride infusion, , Intravenous, Continuous, Jeanella Craze, NP, Last Rate: 10 mL/hr at 09/09/14 1849 . ALPRAZolam Prudy Feeler) tablet 0.5 mg, 0.5 mg, Oral, Q T,Th,Sa-HD, Arita Miss, MD, 0.5 mg at 09/10/14 0706 . amiodarone (PACERONE) tablet 400 mg, 400 mg, Oral, BID, Catarina Hartshorn, MD, 400 mg at 09/11/14 1007 . antiseptic oral rinse (CPC / CETYLPYRIDINIUM CHLORIDE 0.05%) solution 7 mL, 7 mL, Mouth Rinse, q12n4p, Belkys A Regalado, MD, 7 mL at 09/11/14 1147 . aspirin chewable tablet 81 mg, 81 mg, Oral, Daily, Zannie Cove, MD, 81 mg at 09/11/14 1003 . bisacodyl (DULCOLAX) suppository 10 mg, 10 mg, Rectal, Daily PRN, Nonie Hoyer, PA-C . budesonide (PULMICORT) nebulizer solution 0.25 mg, 0.25 mg, Nebulization, BID, Vassie Loll,  MD, 0.25 mg at 09/11/14 0733 . calcitRIOL (ROCALTROL) capsule 0.5 mcg, 0.5 mcg, Oral, Q T,Th,Sat-1800, Beryle Lathe, MD, 0.5 mcg at 09/10/14 1737 . camphor-menthol (SARNA) lotion, , Topical, PRN, Ulice Bold, NP . chlorhexidine (PERIDEX) 0.12 % solution 15 mL, 15 mL, Mouth Rinse, BID, Belkys A Regalado, MD, 15 mL at 09/11/14 0724 . Darbepoetin Alfa (ARANESP) injection 200 mcg, 200 mcg, Intravenous, Q Thu-HD, Arita Miss, MD, 200 mcg at 09/03/14 1644 . diphenhydrAMINE (BENADRYL) injection 12.5 mg, 12.5 mg, Intravenous, Q6H PRN, Vassie Loll, MD, 12.5 mg at 09/08/14 2215 . diphenhydrAMINE-zinc acetate (BENADRYL) 2-0.1 % cream, , Topical, TID PRN, Zannie Cove, MD, 1 application at 08/09/14 1434 . diphenoxylate-atropine (LOMOTIL) 2.5-0.025 MG per tablet 1 tablet, 1 tablet, Oral, QID PRN, Leda Gauze, NP, 1 tablet at 09/03/14 2304 . feeding supplement (PRO-STAT SUGAR FREE 64) liquid 30 mL, 30 mL, Per Tube, TID, Marijean Niemann, RD, 30 mL at 09/11/14 1003 . feeding supplement (RESOURCE BREEZE) (RESOURCE BREEZE) liquid 1 Container, 1 Container, Oral, TID BM, Ailene Ards, RD, 1 Container at 09/11/14 1002 . feeding supplement (VITAL AF 1.2 CAL) liquid 1,000 mL, 1,000 mL, Per Tube, Q24H, Ailene Ards, RD, Stopped at 09/11/14 1035 . ferric gluconate (NULECIT) 125 mg in sodium chloride 0.9 % 100 mL IVPB, 125 mg, Intravenous, Q T,Th,Sa-HD, Beryle Lathe, MD, 125 mg at 09/10/14 0910 . HYDROmorphone (DILAUDID) injection 1-2 mg, 1-2 mg, Intravenous, Q3H PRN, Rhetta Mura, MD, 1 mg at 09/11/14 0203 . ipratropium (ATROVENT) nebulizer solution 0.5 mg, 0.5 mg, Nebulization, TID, Catarina Hartshorn, MD, 0.5 mg at 09/11/14 0733 . levalbuterol (XOPENEX) nebulizer solution 0.63 mg, 0.63 mg, Nebulization, Q4H PRN, Catarina Hartshorn, MD, 0.63 mg at 09/10/14 1234 . levalbuterol (XOPENEX) nebulizer solution 0.63 mg, 0.63 mg, Nebulization, TID, Albertine Grates, MD, 0.63 mg at 09/11/14  0733 . levothyroxine (SYNTHROID, LEVOTHROID) tablet 25 mcg, 25 mcg, Oral, QAC breakfast, Zannie Cove, MD, 25 mcg at 09/11/14 1007 . LORazepam (ATIVAN) injection 1-2 mg, 1-2 mg, Intravenous, Daily PRN, Molly Maduro  Schertz, MD, 1 mg at 09/09/14 0345 . metroNIDAZOLE (FLAGYL) tablet 500 mg, 500 mg, Oral, 3 times per day, Zannie Cove, MD, 500 mg at 09/11/14 0507 . multivitamin (RENA-VIT) tablet 1 tablet, 1 tablet, Oral, QHS, Beryle Lathe, MD, 1 tablet at 09/10/14 2208 . ondansetron (ZOFRAN) injection 4 mg, 4 mg, Intravenous, Q4H PRN, Oley Balm, MD . oxyCODONE (Oxy IR/ROXICODONE) immediate release tablet 5 mg, 5 mg, Oral, Q6H PRN, Samantha J Rhyne, PA-C, 5 mg at 09/11/14 1002 . sodium chloride 0.9 % injection 10-40 mL, 10-40 mL, Intracatheter, Q12H, Belkys A Regalado, MD, 10 mL at 09/09/14 0345 . sodium chloride 0.9 % injection 10-40 mL, 10-40 mL, Intracatheter, PRN, Belkys A Regalado, MD, 10 mL at 09/11/14 0813  Patients Current Diet: DIET SOFT Room service appropriate?: Yes; Fluid consistency:: Thin  Precautions / Restrictions Precautions Precautions: Fall Precaution Comments: G tube, abdominal binder, abdominal wound Other Brace/Splint: abdominal binder Restrictions Weight Bearing Restrictions: No   Has the patient had 2 or more falls or a fall with injury in the past year?No  Prior Activity Level Limited Community (1-2x/wk): Micah Flesher out with daughter 3 X a week shopping or to appts. Did drive short distances herself. Has not worked in more than a year  Journalist, newspaper / Corporate investment banker Devices/Equipment: Medical laboratory scientific officer (specify quad or straight), Dentures (specify type) (full upper plate, partial lower plate) Home Equipment: None  Prior Device Use: Indicate devices/aids used by the patient prior to current illness, exacerbation or injury? None of the above. Used a straight cane on occasion at home when her gout flared up in her foot.  Prior Functional Level Prior  Function Level of Independence: Independent  Self Care: Did the patient need help bathing, dressing, using the toilet or eating? Needed some help. Needed help when gout flared up with dressing and shoes.  Indoor Mobility: Did the patient need assistance with walking from room to room (with or without device)? Independent  Stairs: Did the patient need assistance with internal or external stairs (with or without device)? Independent  Functional Cognition: Did the patient need help planning regular tasks such as shopping or remembering to take medications? Needed some help. Daughter has been assisting with appointments, shopping and driving.  Current Functional Level Cognition  Overall Cognitive Status: Within Functional Limits for tasks assessed Current Attention Level: Selective Orientation Level: Oriented X4 General Comments: Pt tearful, grateful for her progress and R drain removal today.   Extremity Assessment (includes Sensation/Coordination)  Upper Extremity Assessment: Defer to OT evaluation LUE Deficits / Details: Pt with minimal shoulder elevation, scapular depression and adduction; forearm sup/pron 3/5; finger flex/ext 2+/5 LUE Sensation: decreased proprioception LUE Coordination: decreased fine motor, decreased gross motor  Lower Extremity Assessment: Defer to PT evaluation LLE Deficits / Details: unable to activate left LE for motor function in supine; supported weight some with left LE sitting LLE Sensation: decreased proprioception LLE Coordination: decreased fine motor, decreased gross motor    ADLs  Overall ADL's : Needs assistance/impaired Eating/Feeding: Set up, Sitting Eating/Feeding Details (indicate cue type and reason): pt now on clear liquids, self fed jello holding bowl with L hand Grooming: Wash/dry hands, Wash/dry face, Sitting, Set up Grooming Details (indicate cue type and reason): used mouthwash, washed face including wringing out  washcloth Upper Body Bathing: Minimal assitance, Sitting Upper Body Bathing Details (indicate cue type and reason): washed back for her and rubbed with anti itch lotion Lower Body Bathing: Moderate assistance, Sitting/lateral leans, Sit to/from stand Lower Body Bathing  Details (indicate cue type and reason): pt washed tops of legs and front periarea, assist for back side and feet Upper Body Dressing : Set up, Bed level Lower Body Dressing: Minimal assistance, Sitting/lateral leans Lower Body Dressing Details (indicate cue type and reason): in long sitting, pt able to donn R sock, min assist to donn L sock Toilet Transfer: Minimal assistance, Stand-pivot, RW, BSC Toilet Transfer Details (indicate cue type and reason): bed to 3 in 1 Toileting- Clothing Manipulation and Hygiene: Supervision/safety, Sitting/lateral lean Toileting - Clothing Manipulation Details (indicate cue type and reason): performed pericare after urinating Functional mobility during ADLs: +2 for physical assistance, Minimal assistance, Rolling walker, Cueing for sequencing General ADL Comments: Pt with diarrhea, assisted to bedpan due to urgency and for pericare and change of gown and bed linens. Pt apologetic. Left pt in bed due to frequency of BMs at pt's request, does not want to not be able to get to [facility] in time.    Mobility  Overal bed mobility: Needs Assistance Bed Mobility: Rolling, Sit to Supine, Sidelying to Sit Rolling: Supervision Sidelying to sit: Min guard Supine to sit: Supervision, HOB elevated Sit to supine: Min assist Sit to sidelying: +2 for physical assistance, Max assist General bed mobility comments: cues for sequence with HOB 20degrees and increased time but pt able to fully transfer to EOB without assist today    Transfers  Overall transfer level: Needs assistance Equipment used: None, 1 person hand held assist Transfers: Sit to/from Stand, Stand Pivot Transfers Sit to Stand: Mod  assist Stand pivot transfers: Mod assist Squat pivot transfers: Mod assist, +2 safety/equipment General transfer comment: mod assist from low surface, min assist from higher surface x 2 trials. cues for hand placement and anterior translation    Ambulation / Gait / Stairs / Wheelchair Mobility  Ambulation/Gait Ambulation/Gait assistance: Min assist Ambulation Distance (Feet): 88 Feet Assistive device: Rolling walker (2 wheeled) General Gait Details: Pt walked 28' then 40' with RW and seated rest. Cues for posture and position in RW Gait Pattern/deviations: Step-through pattern, Decreased stride length, Trunk flexed, Wide base of support Gait velocity interpretation: Below normal speed for age/gender    Posture / Balance Dynamic Sitting Balance Sitting balance - Comments: more assist to shift L hip to EOB vs R, used bed pad Balance Overall balance assessment: Needs assistance Sitting-balance support: No upper extremity supported, Feet supported Sitting balance-Leahy Scale: Good Sitting balance - Comments: more assist to shift L hip to EOB vs R, used bed pad Postural control: Posterior lean Standing balance support: No upper extremity supported, During functional activity Standing balance-Leahy Scale: Poor Standing balance comment: remains heavily reliant on UEs    Special needs/care/consideration BiPAP/CPAP No CPM No Continuous Drip IV KVO Dialysis Yes, new HD DaysT-TH-Sat Life Vest No Oxygen No Special Bed No Trach Size No Wound Vac (area) No  Skin Had abdominal and arm incisions and dressings  Bowel mgmt: Last documented BM 09/11/14 Bladder mgmt: Oliguria Diabetic mgmt No    Previous Home Environment Living Arrangements: Children Available Help at Discharge: Family Type of Home: Apartment Home Layout: Able to live on main level with bedroom/bathroom Home Access: Stairs to enter Secretary/administrator of Steps: 12 Home  Care Services: No Additional Comments: tub shower, standard height toilets  Discharge Living Setting Plans for Discharge Living Setting: Patient's home, Lives with (comment) (Dtr lives with patient.) Type of Home at Discharge: House (Lives in a townhouse.) Discharge Home Layout: Two level, 1/2 bath on main level,  Bed/bath upstairs Alternate Level Stairs-Number of Steps: Flight Discharge Home Access: Stairs to enter [employer] of Steps: 1 small step to porch and small step into townhouse. Does the patient have any problems obtaining your medications?: No  Social/Family/Support Systems Patient Roles: Parent (Has a daughter and a son.) Contact Information: Roxy Horseman Anticipated Caregiver: daughter and son Anticipated Caregiver's Contact Information: [name] - daughter [phone] Ability/Limitations of Caregiver: Daughter works 2:30p to 11 pm. Son works a flexible schedule and comes home at 12 noon up to 5 pm. Caregiver Availability: Other (Comment) (Will have close to 24/7 supervision.) Discharge Plan Discussed with Primary Caregiver: Yes Is Caregiver In Agreement with Plan?: Yes Does Caregiver/Family have Issues with Lodging/Transportation while Pt is in Rehab?: No  Goals/Additional Needs Patient/Family Goal for Rehab: PT/OT supervision to min assist goals Expected length of stay: 18-22 days Cultural Considerations: None Dietary Needs: Soft diet, thin liquids Equipment Needs: TBD Pt/Family Agrees to Admission and willing to participate: Yes Program Orientation Provided & Reviewed with Pt/Caregiver Including Roles & Responsibilities: Yes  Decrease burden of Care through IP rehab admission: N/A  Possible need for SNF placement upon discharge: Not planned  Patient Condition: This patient's medical and functional status has changed since the consult dated: 08/17/14 in which the Rehabilitation Physician determined and documented that the patient's condition is  appropriate for intensive rehabilitative care in an inpatient rehabilitation facility. See "History of Present Illness" (above) for medical update. Functional changes are: Currently requiring min assist for transfers and min assist to ambulate 82 ft RW. Patient's medical and functional status update has been discussed with the Rehabilitation physician and patient remains appropriate for inpatient rehabilitation. Will admit to inpatient rehab today.  Preadmission Screen Completed By: Trish Mage, 09/11/2014 12:11 PM ______________________________________________________________________  Discussed status with Dr. Riley Kill on 09/11/14 at 1212 and received telephone approval for admission today.  Admission Coordinator: Trish Mage, time1213/Date06/10/16          Cosigned by: Ranelle Oyster, MD at 09/11/2014 12:29 PM  Revision History     Date/Time User Provider Type Action   09/11/2014 12:29 PM Ranelle Oyster, MD Physician Cosign   09/11/2014 12:13 PM Trish Mage, RN Rehab Admission Coordinator Sign

## 2014-09-14 NOTE — Care Management Note (Signed)
[facility] Individual Statement of Services  Patient Name:  [name]  Date:  09/14/2014  Welcome to the [facility].  Our goal is to provide you with an individualized program based on your diagnosis and situation, designed to meet your specific needs.  With this comprehensive rehabilitation program, you will be expected to participate in at least 3 hours of rehabilitation therapies Monday-Friday, with modified therapy programming on the weekends.  Your rehabilitation program will include the following services:  Physical Therapy (PT), Occupational Therapy (OT), Speech Therapy (ST), 24 hour per day rehabilitation nursing, Therapeutic Recreaction (TR), Neuropsychology, Case Management (Social Worker), Rehabilitation Medicine, Nutrition Services and Pharmacy Services  Weekly team conferences will be held on Wednesday to discuss your progress.  Your Social Worker will talk with you frequently to get your input and to update you on team discussions.  Team conferences with you and your family in attendance may also be held.  Expected length of stay: 14-19 days Overall anticipated outcome: supervision with cueing  Depending on your progress and recovery, your program may change. Your Social Worker will coordinate services and will keep you informed of any changes. Your Social Worker's name and contact numbers are listed  below.  The following services may also be recommended but are not provided by the [facility]:   Driving Evaluations  Home Health Rehabiltiation Services  Outpatient Rehabilitation Services    Arrangements will be made to provide these services after discharge if needed.  Arrangements include referral to agencies that provide these services.  Your insurance has been verified to be:  Medicaid Your primary doctor is:  [name]  Pertinent information will be shared with your doctor and your insurance  company.  Social Worker:  Dossie Der, SW [phone] or ([phone]  Information discussed with and copy given to patient by: Lucy Chris, 09/14/2014, 11:49 AM

## 2014-09-14 NOTE — Progress Notes (Signed)
Physical Therapy Session Note  Patient Details  Name: [name] MRN: [id number] Date of Birth: 1956-01-10  Today's Date: 09/14/2014 PT Individual Time: 0900-1000 PT Individual Time Calculation (min): 60 min   Short Term Goals: Week 1:  PT Short Term Goal 1 (Week 1): Pt will perform sit<>stand with UE support at min A level  PT Short Term Goal 2 (Week 1): Pt will perform stand pivot transfers with LRAD at min A level  PT Short Term Goal 3 (Week 1): Pt will tolerate dynamic standing task for 2 mins with SaO2 remaining in the 90's.  PT Short Term Goal 4 (Week 1): Pt will ambulate x 45' w/ LRAD at S min/guard level with SaO2 in the 90's.    Skilled Therapeutic Interventions/Progress Updates:   Pt received upright in bed.  Pt reporting some pain on L side; RN alerted when morning meds given.  Pt reporting need to urinate.  Performed bed mobility with min A and HOB elevated.  Donned Abdominal binder EOB and transferred bed > BSC stand pivot mod A and max-total verbal cues for sequence and hand placement.  Pt required prolonged period of time on [facility] to initiate urination; while on [facility] discussed goals for therapy, normal activities PTA, home set up, equipment and assistance available at D/C.  Performed sit > stand and donned lower body clothing with min-mod A secondary to one LOB when both UE engaged in reaching down and donning clothing.  Pt able to maintain standing with bilat UE support on RW with min A to switch to w/c.  Transferred to gym in w/c total A.  Performed gait and stair negotiation training: performed gait x 10-15' with RW and min-mod A with verbal and visual cues for safe sequencing with RW during changes in direction, upright posture and full step length LLE.  Also performed stair negotiation up/down 7 steps with bilat UE support on 3" steps with min-mod A with verbal and tactile cues for more upright posture, safe step to sequence and full L foot advancement and clearance; ascended  forwards and descended backwards.  Pt required one-two rest breaks in the w/c with Sp02 100% and HR: 99 bpm after performing stairs.   Returned to room and pt left in w/c with all items within reach; pt to rest for one hour until OT session.   Therapy Documentation Precautions:  Precautions Precautions: Fall Precaution Comments:  abdominal binder, abdominal wound, 2-3L 02 to maintain Sp02 >90% Other Brace/Splint: abdominal binder Restrictions Weight Bearing Restrictions: No Vital Signs: Oxygen Therapy O2 Device: Nasal Cannula O2 Flow Rate (L/min): 2 L/min Locomotion : Ambulation Ambulation/Gait Assistance: 4: Min assist;3: Mod assist   See FIM for current functional status  Therapy/Group: Individual Therapy  Edman Circle [facility] 09/14/2014, 12:14 PM

## 2014-09-14 NOTE — Progress Notes (Signed)
Patient information reviewed and entered into eRehab system by Jedadiah Abdallah, RN, CRRN, PPS Coordinator.  Information including medical coding and functional independence measure will be reviewed and updated through discharge.    

## 2014-09-14 NOTE — Progress Notes (Signed)
Occupational Therapy Session Note  Patient Details  Name: [name] MRN: [id number] Date of Birth: 06-05-1955  Today's Date: 09/14/2014 OT Individual Time: 1105-1200 OT Individual Time Calculation (min): 55 min    Short Term Goals: Week 1:  OT Short Term Goal 1 (Week 1): Pt will engage in 10 minutes of functional activity without O2 stats dropping below 90% while on 2L of O2 or less. OT Short Term Goal 2 (Week 1): Pt will perform toilet transfer onto standard toilet with min A in order to increase functional transfers. OT Short Term Goal 3 (Week 1): When bathing at sink, pt will perform with min A balance during STS. OT Short Term Goal 4 (Week 1): Pt will perform LB dressing with Mod A in order to decrease level of assistance for self care.  Skilled Therapeutic Interventions/Progress Updates:    Pt seen for 1:1 OT session with a focus on sit<>stand, activity tolerance, standing balance, and safety awareness. Pt received seated in w/c finishing respiratory treatment. Pt agreeable to participate in ADL session at sink level. Pt performed bathing/dressing sit<>stand from w/c. Pt required min A for sit<>stand and steadying A while performing hygiene in standing position. Pt required increased time during B/D due to increased fatigue level during activity. Pt transitioned to day room and engaged in functional activity with a focus on sit<>stand, standing balance, and overall activity tolerance. Pt able to stand with min A 2x for apprx 4 minutes before requiring rest break. Noted decrease in O2 stats while on 2L during sit<>stand, but stats able to increase when rest break and pursed lip breathing strategy provided. Pt left seated in w/c with lunch and all other needs in reach.    Therapy Documentation Precautions:  Precautions Precautions: Fall Precaution Comments:  abdominal binder, abdominal wound, 2-3L 02 to maintain Sp02 >90% Other Brace/Splint: abdominal binder Restrictions Weight  Bearing Restrictions: No General: General OT Amount of Missed Time: 5 Minutes Vital Signs: Oxygen Therapy O2 Device: Nasal Cannula O2 Flow Rate (L/min): 2 L/min Pain:   ADL:   Exercises:   Other Treatments:    See FIM for current functional status  Therapy/Group: Individual Therapy  Alger Memos 09/14/2014, 12:32 PM

## 2014-09-15 ENCOUNTER — Inpatient Hospital Stay (HOSPITAL_COMMUNITY): Payer: Medicaid Other | Admitting: Occupational Therapy

## 2014-09-15 ENCOUNTER — Inpatient Hospital Stay (HOSPITAL_COMMUNITY): Payer: Medicaid Other | Admitting: Physical Therapy

## 2014-09-15 LAB — RENAL FUNCTION PANEL
Albumin: 2.3 g/dL — ABNORMAL LOW (ref 3.5–5.0)
Anion gap: 16 — ABNORMAL HIGH (ref 5–15)
BUN: 51 mg/dL — ABNORMAL HIGH (ref 6–20)
CO2: 22 mmol/L (ref 22–32)
Calcium: 8.1 mg/dL — ABNORMAL LOW (ref 8.9–10.3)
Chloride: 91 mmol/L — ABNORMAL LOW (ref 101–111)
Creatinine, Ser: 5.11 mg/dL — ABNORMAL HIGH (ref 0.44–1.00)
GFR calc Af Amer: 10 mL/min — ABNORMAL LOW (ref >60)
GFR calc non Af Amer: 8 mL/min — ABNORMAL LOW (ref >60)
Glucose, Bld: 88 mg/dL (ref 65–99)
Phosphorus: 7.8 mg/dL — ABNORMAL HIGH (ref 2.5–4.6)
Potassium: 4.4 mmol/L (ref 3.5–5.1)
Sodium: 129 mmol/L — ABNORMAL LOW (ref 135–145)

## 2014-09-15 LAB — GLUCOSE, CAPILLARY
GLUCOSE-CAPILLARY: 65 mg/dL (ref 65–99)
Glucose-Capillary: 79 mg/dL (ref 65–99)
Glucose-Capillary: 86 mg/dL (ref 65–99)
Glucose-Capillary: 97 mg/dL (ref 65–99)

## 2014-09-15 LAB — CBC
HEMATOCRIT: 29.5 % — AB (ref 36.0–46.0)
HEMOGLOBIN: 9.4 g/dL — AB (ref 12.0–15.0)
MCH: 29.8 pg (ref 26.0–34.0)
MCHC: 31.9 g/dL (ref 30.0–36.0)
MCV: 93.7 fL (ref 78.0–100.0)
Platelets: 353 10*3/uL (ref 150–400)
RBC: 3.15 MIL/uL — AB (ref 3.87–5.11)
RDW: 19.6 % — ABNORMAL HIGH (ref 11.5–15.5)
WBC: 9 10*3/uL (ref 4.0–10.5)

## 2014-09-15 LAB — HEPATITIS B SURFACE ANTIGEN: Hepatitis B Surface Ag: NEGATIVE

## 2014-09-15 MED ORDER — VITAL AF 1.2 CAL PO LIQD
1000.0000 mL | ORAL | Status: DC
  Administered 2014-09-15 – 2014-09-20 (×6): 1000 mL
  Filled 2014-09-15 (×9): qty 1000

## 2014-09-15 MED ORDER — ANTICOAGULANT SODIUM CITRATE 4% (200MG/5ML) IV SOLN
5.0000 mL | Status: AC
Start: 2014-09-15 — End: 2014-09-16
  Filled 2014-09-15: qty 250

## 2014-09-15 MED ORDER — SODIUM CHLORIDE 0.9 % IV SOLN: 100.0000 mL | INTRAVENOUS | Status: DC | PRN

## 2014-09-15 MED ORDER — PENTAFLUOROPROP-TETRAFLUOROETH EX AERO: 1.0000 "application " | INHALATION_SPRAY | CUTANEOUS | Status: DC | PRN

## 2014-09-15 MED ORDER — NEPRO/CARBSTEADY PO LIQD: 237.0000 mL | ORAL | Status: DC | PRN

## 2014-09-15 MED ORDER — VITAMIN C 250 MG PO TABS
250.0000 mg | ORAL_TABLET | Freq: Every day | ORAL | Status: DC
  Administered 2014-09-16 – 2014-10-02 (×15): 250 mg via ORAL
  Filled 2014-09-15 (×19): qty 1

## 2014-09-15 MED ORDER — ALTEPLASE 2 MG IJ SOLR
2.0000 mg | Freq: Once | INTRAMUSCULAR | Status: DC | PRN
  Filled 2014-09-15: qty 2

## 2014-09-15 MED ORDER — LIDOCAINE-PRILOCAINE 2.5-2.5 % EX CREA
1.0000 "application " | TOPICAL_CREAM | CUTANEOUS | Status: DC | PRN
  Filled 2014-09-15: qty 5

## 2014-09-15 MED ORDER — LIDOCAINE HCL (PF) 1 % IJ SOLN: 5.0000 mL | INTRAMUSCULAR | Status: DC | PRN

## 2014-09-15 NOTE — Progress Notes (Signed)
Physical Therapy Session Note  Patient Details  Name: [name] MRN: [id number] Date of Birth: 24-May-1955  Today's Date: 09/15/2014 PT Individual Time: 0800-0915 PT Individual Time Calculation (min): 75 min   Short Term Goals: Week 1:  PT Short Term Goal 1 (Week 1): Pt will perform sit<>stand with UE support at min A level  PT Short Term Goal 2 (Week 1): Pt will perform stand pivot transfers with LRAD at min A level  PT Short Term Goal 3 (Week 1): Pt will tolerate dynamic standing task for 2 mins with SaO2 remaining in the 90's.  PT Short Term Goal 4 (Week 1): Pt will ambulate x 7' w/ LRAD at S min/guard level with SaO2 in the 90's.    Skilled Therapeutic Interventions/Progress Updates:    Pt received supine in bed with HOB elevated; reports pain in L flank/abdomen as described below. All activities performed with pt on 2L O2 via [place]. Pt instructed in gait x10 ft from bed to toilet and assisted with toilet transfer with modA for lowering assist and maxA for lifting assist; performed management of gown and perineal hygiene without assistance. Following ambulation to/from bathroom O2 sats dropped to 85%, returned to WNL within 20-30 seconds when resting and with verbal cues for pursed lip breathing. Pt instructed in UE/LE dressing while sitting on EOB to improve sitting tolerance, activity tolerance, and sitting balance. Requires mod/maxA for donning socks and shoes due to poor sitting balance and ROM/body habitus. Donning pants and brief required modA and able to pull pants up minA upon standing. Upper body dressing performed with minA. Gait training initiated with pt requiring several trials to complete sit >stand, with bed progressively elevated. Pt became frustrated and emotional, stating she "couldn't do anything with her body"; pt given therapeutic rest break and discussed with pt the effects of prolonged bed rest and gradual process necessary to regain strength and function. Pt agreeable to  further attempts and able to perform sit>stand with bed elevated. Pt instructed in gait training for 1 trial of 20 ft using RW and minA for IV and O2 management; O2 sats at 90% following completion of gait training and returned to WNL within 10-20 seconds. Pt instructed in standing marching for 1 trial of approximately 1 min before pt became very lethargic and requests to sit in w/c. Transfer to w/c performed minA. Pt. Remained in w/c at completion of treatment session; all needs within reach.   Therapy Documentation Precautions:  Precautions Precautions: Fall Precaution Comments:  abdominal binder, abdominal wound, 2-3L 02 to maintain Sp02 >90% Other Brace/Splint: abdominal binder Restrictions Weight Bearing Restrictions: No Vital Signs: Oxygen Therapy SpO2: 99 % O2 Device: Nasal Cannula O2 Flow Rate (L/min): 2 L/min Pain: Pain Assessment Pain Assessment: 0-10 Pain Score: 7  Pain Type: Acute pain Pain Location: Flank Pain Orientation: Left Pain Descriptors / Indicators: Aching Pain Frequency: Intermittent Pain Onset: On-going Patients Stated Pain Goal: 4 Pain Intervention(s): Other (Comment) (pre-medicated) Multiple Pain Sites: No Locomotion : Ambulation Ambulation/Gait Assistance: 4: Min guard   See FIM for current functional status  Therapy/Group: Individual Therapy  [name] 09/15/2014, 12:17 PM

## 2014-09-15 NOTE — Progress Notes (Signed)
[facility] PH[facility]ICAL MEDICINE & REHABILITATION     PROGRESS NOTE   CC Get anxious in HD Subjective/Complaints: No abd pai with meals, only getting of TF at noc, takes meal plus supplements po  ROS: Pt denies  rash/itching, headache, blurred or double vision, nausea, vomiting, abdominal pain, diarrhea, chest pain,, anxiety, or depression   Objective: Vital Signs: Blood pressure 127/77, pulse 89, temperature 98 F (36.7 C), temperature source Oral, resp. rate 18, height 5\' 7"  (1.702 m), weight 103.329 kg (227 lb 12.8 oz), SpO2 98 %. No results found.  Recent Labs  09/12/14 1700  WBC 10.0  HGB 9.0*  HCT 27.7*  PLT 370    Recent Labs  09/12/14 1700  NA 125*  K 4.5  CL 88*  GLUCOSE 82  BUN 51*  CREATININE 4.66*  CALCIUM 8.3*   CBG (last 3)   Recent Labs  09/14/14 1958 09/15/14 0002 09/15/14 0354  GLUCAP 101* 79 97    Wt Readings from Last 3 Encounters:  09/15/14 103.329 kg (227 lb 12.8 oz)  09/10/14 98.975 kg (218 lb 3.2 oz)  06/01/14 102.059 kg (225 lb)    Physical Exam:  Gen: alert, no distress, obese HENT:  Poor dentition,  Eyes: EOM are normal.  Neck: Normal range of motion. Neck supple. No thyromegaly present.  Cardiovascular: Normal rate and regular rhythm. no murmurs Respiratory: Effort normal and breath sounds normal. No respiratory distress. occ rhonchi and upper airway sounds. No cough today GI: Bowel sounds are normal.  Abdominal wound is dressed . G-tube site clean and dry , no drainage Skin: left AVG site clean, less tender, minimal edema Neurological: She is alert.  She is oriented to person place date of birth.  LUE 4/5 deltoid, bicep, tricep. 4/5 wrist/hand. RUE grossly 4+/5. LE: 3 RHF, 3 LHF, 3+ RKE, 3 LKE, ankles grossly 3+ to 4-/5. Resting tremor in arms/legs less apparent today. Intact LT BLE.   cooperative. Attention, awareness, insight are fair Psych: pleasant    Assessment/Plan: 1. Functional deficits secondary to  right ACA infarct which require 3+ hours per day of interdisciplinary therapy in a comprehensive inpatient rehab setting. Physiatrist is providing close team supervision and 24 hour management of active medical problems listed below. Physiatrist and rehab team continue to assess barriers to discharge/monitor patient progress toward functional and medical goals. FIM: FIM - Bathing Bathing Steps Patient Completed: Chest, Right Arm, Left Arm, Abdomen, Front perineal area, Buttocks (only wanted to complete UB bathing) Bathing: 4: Steadying assist  FIM - Upper Body Dressing/Undressing Upper body dressing/undressing steps patient completed: Thread/unthread right bra strap, Thread/unthread left bra strap, Thread/unthread right sleeve of pullover shirt/dresss, Thread/unthread left sleeve of pullover shirt/dress, Put head through opening of pull over shirt/dress Upper body dressing/undressing: 3: Mod-Patient completed 50-74% of tasks FIM - Lower Body Dressing/Undressing Lower body dressing/undressing steps patient completed: Pull pants up/down (pt requesting to keep same pants on) Lower body dressing/undressing: 0: Wears gown/pajamas-no public clothing  FIM - Toileting Toileting steps completed by patient: Adjust clothing prior to toileting, Adjust clothing after toileting Toileting Assistive Devices: Grab bar or rail for support Toileting: 3: Mod-Patient completed 2 of 3 steps  FIM - Diplomatic Services operational officer Devices: Arts development officer, Art gallery manager Transfers: 3-To toilet/BSC: Mod A (lift or lower assist), 3-From toilet/BSC: Mod A (lift or lower assist)  FIM - Banker Devices: Therapist, occupational: 4: Supine > Sit: Min A (steadying Pt. > 75%/lift 1 leg), 3: Bed >  Chair or W/C: Mod A (lift or lower assist), 3: Chair or W/C > Bed: Mod A (lift or lower assist)  FIM - Locomotion: Wheelchair Distance: 50 Locomotion: Wheelchair: 1: Total  Assistance/staff pushes wheelchair (Pt<25%) FIM - Locomotion: Ambulation Locomotion: Ambulation Assistive Devices: Designer, industrial/product Ambulation/Gait Assistance: 4: Min assist Locomotion: Ambulation: 1: Travels less than 50 ft with minimal assistance (Pt.>75%)  Comprehension Comprehension Mode: Auditory Comprehension: 5-Follows basic conversation/direction: With no assist  Expression Expression Mode: Verbal Expression: 5-Expresses basic needs/ideas: With no assist  Social Interaction Social Interaction: 5-Interacts appropriately 90% of the time - Needs monitoring or encouragement for participation or interaction.  Problem Solving Problem Solving: 5-Solves complex 90% of the time/cues < 10% of the time  Memory Memory: 5-Recognizes or recalls 90% of the time/requires cueing < 10% of the time  Medical Problem List and Plan: 1. 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 status post loop recorder/respiratory failure/multi-medical  -tolerating therapies thus far 2. DVT Prophylaxis/Anticoagulation: SCD.  Venous Doppler studies negative 3. Pain Management: Oxycodone as needed. Fair control at present 4. End-stage renal disease. Left arm AV fistula 09/09/2014. Hemodialysis as per renal services 5. Neuropsych: This patient is capable of making decisions on her own behalf. 6. Skin/Wound Care: Routine skin checks/dressing changes as advised to abdominal wound with wet-to-dry 7. Fluids/Electrolytes/Nutrition: . Check labs Monday  -continuous TF on board overnight--will cut in half to help increase appetite 8. Hx of C diff: placed on flagyl for one week given use of abx during this hospitalization 9. Hypertension/intermittent bouts of PSVT. Lopressor 12.5 mg twice a day--rate controlled. 10. Massive pneumoperitoneum and ascites. Status post exploratory laparotomy and closure of perforated pyloric ulcer 07/01/2014.  -maintain dressing changes per Gen surg. 11. Decreased nutritional  storage. Advance diet as tolerated. Status post gastrostomy tube 08/12/2014 per interventional radiology 12. Hypothyroidism. Synthroid 13. Mood/anxiety. Ativan as needed 14. Intermittent cough, "?asthma"---pulmicort neb prn effective. May be anxiety component also, statred celexa, monitor 15.  LOS (Days) 4 A FACE TO FACE EVALUATION WAS PERFORMED  Erick Colace 09/15/2014 7:53 AM

## 2014-09-15 NOTE — Progress Notes (Signed)
Patient with history of anxiety depression. Xanax has been scheduled hemodialysis. Resume Celexa as directed and neuropsychology of follow-up. Will discuss in team conference consultation with psychiatry services

## 2014-09-15 NOTE — Progress Notes (Signed)
Occupational Therapy Session Note  Patient Details  Name: [name] MRN: [id number] Date of Birth: [birth year]  Today's Date: 09/15/2014 OT Individual Time: 1050-1200 and 1305-1405 OT Individual Time Calculation (min): 70 min and 60 min   Short Term Goals: Week 1:  OT Short Term Goal 1 (Week 1): Pt will engage in 10 minutes of functional activity without O2 stats dropping below 90% while on 2L of O2 or less. OT Short Term Goal 2 (Week 1): Pt will perform toilet transfer onto standard toilet with min A in order to increase functional transfers. OT Short Term Goal 3 (Week 1): When bathing at sink, pt will perform with min A balance during STS. OT Short Term Goal 4 (Week 1): Pt will perform LB dressing with Mod A in order to decrease level of assistance for self care.  Skilled Therapeutic Interventions/Progress Updates:    1) Engaged in therapeutic activity with focus on activity tolerance, sit <> stand, and functional use of LUE.  Pt reports having a tough day but willing to participate in therapy session.  In therapy gym engaged in reaching task to challenge dynamic sitting balance and trunk control when reaching to Lt with LUE.  Progressed to sit <> stand and standing balance with reaching task.  Pt with shaking LLE > RLE in standing but able to tolerate standing for approx 3 mins each attempt.  Pt required mod assist sit > stand from elevated mat, max-total from lower mat.  Pt required multiple rest breaks throughout session with O2 dropping to mid 80s on 2L O2, intermittent bumping up to 3L during activity.    2)  Engaged in therapeutic activity with focus on functional transfers, sit <> stand, and activity tolerance.  Pt received in w/c reporting need to toilet.  Stand pivot transfer w/c > toilet with mod assist and use of grab bars to facilitate forward weight shift for sit > stand.  Pt with continent BM on toilet requiring steady assist in sitting and then standing while completing  hygiene.  Required assist for clothing management both up and down.  In Dayroom, engaged in card matching activity in standing with focus on sit > stand, standing tolerance, and visually scanning.  Pt required increased time to locate matching cards as cards with multiple pictures on each requiring increased scanning and problem solving.  Utilized question cues and categories to increase success with locating appropriate match.  Pt able to tolerate standing for periods of 3 mins before requesting rest break.  Returned to room and transferred back to bed in preparation for HD.  Therapy Documentation Precautions:  Precautions Precautions: Fall Precaution Comments:  abdominal binder, abdominal wound, 2-3L 02 to maintain Sp02 >90% Other Brace/Splint: abdominal binder Restrictions Weight Bearing Restrictions: No General:   Vital Signs: Therapy Vitals Temp Source: Oral Pulse Rate: 86 Resp: 17 BP: 130/70 mmHg Patient Position (if appropriate): Lying Oxygen Therapy O2 Device: Nasal Cannula O2 Flow Rate (L/min): 2 L/min Pain: Pain Assessment Pain Assessment: 0-10 Pain Score: 7  Pain Type: Acute pain Pain Location: Flank Pain Orientation: Left Pain Descriptors / Indicators: Aching Pain Onset: On-going Patients Stated Pain Goal: 4 Pain Intervention(s): Other (Comment) (pre-medicated) Multiple Pain Sites: No  See FIM for current functional status  Therapy/Group: Individual Therapy  Rosalio Loud 09/15/2014, 3:15 PM

## 2014-09-15 NOTE — Progress Notes (Signed)
Patient transported to dialysis at 1655.

## 2014-09-15 NOTE — Progress Notes (Signed)
Pt returned to unit from HD with staff in wheelchair. Pt placed back in bed, and vital signs taken. Temp- 98.6, BP- 116/76, P-94 R-17 and O2 100% RA. Per report pt was in HD for 4 hours with 2876 ml of pulled, with a goal of 3L. Could not reach goal due to BP per report. Pt is in no signs of distress at this time. Will continue to monitor. Rudie Meyer, RN

## 2014-09-16 ENCOUNTER — Inpatient Hospital Stay (HOSPITAL_COMMUNITY): Payer: Medicaid Other | Admitting: Occupational Therapy

## 2014-09-16 ENCOUNTER — Inpatient Hospital Stay (HOSPITAL_COMMUNITY): Payer: Medicaid Other | Admitting: Physical Therapy

## 2014-09-16 ENCOUNTER — Inpatient Hospital Stay (HOSPITAL_COMMUNITY): Payer: Medicaid Other

## 2014-09-16 MED ORDER — SEVELAMER CARBONATE 2.4 G PO PACK
2.4000 g | PACK | Freq: Three times a day (TID) | ORAL | Status: DC
  Administered 2014-09-16 – 2014-10-02 (×31): 2.4 g via ORAL
  Filled 2014-09-16 (×51): qty 1

## 2014-09-16 NOTE — Patient Care Conference (Signed)
Inpatient RehabilitationTeam Conference and Plan of Care Update Date: 09/16/2014   Time: 11;15 AM    Patient Name: [name]      Medical Record Number: [id number]  Date of Birth: January 15, 1956 Sex: Female         Room/Bed: 4W17C/4W17C-01 Payor Info: Payor: MEDICAID [id number] / Plan: MEDICAID [place] ACCESS / Product Type: *No Product type* /    Admitting Diagnosis: R CVA  Admit Date/Time:  09/11/2014  4:43 PM Admission Comments: No comment available   Primary Diagnosis:  Acute right ACA stroke Principal Problem: Acute right ACA stroke  Patient Active Problem List   Diagnosis Date Noted  . Acute right ACA stroke 09/11/2014  . Left hemiparesis 09/11/2014  . History of ETT   . Duodenal fistula   . Sepsis   . Aortic aneurysm 08/13/2014  . Protein-calorie malnutrition   . Itching 08/12/2014  . Duodenal anastomotic leak   . Protein calorie malnutrition   . SVT (supraventricular tachycardia)   . Right heart failure   . ESRD (end stage renal disease)   . Abdominal pain   . Palliative care encounter   . 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 Fistula   . History of intermediate V/Q scan   . Other emphysema   . Intra-abdominal abscess   . Abnormal TSH   . Enteritis due to Clostridium difficile 07/19/2014  . Abdominal abscess   . Pelvic fluid collection   . Perforated gastric ulcer 07/01/2014  . SOB (shortness of breath)   . PJRT (permanent junctional reciprocating tachycardia)   . Paroxysmal atrial tachycardia   . PSVT (paroxysmal supraventricular tachycardia)   . Stroke   . Hyperlipidemia   . 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 Hypertension 06/20/2014  . ESRD needing dialysis 06/20/2014  . Obesity (BMI 30-39.9) 06/20/2014  . Depression 06/20/2014  . Left renal mass 06/20/2014  . Chronic diastolic heart failure 06/20/2014  . H1N1 influenza 06/20/2014  . Tobacco use disorder 06/20/2014  . ARF (acute renal failure)   . Renal failure (ARF),  acute on chronic   . Acute respiratory failure with hypoxia 06/17/2014  . End stage renal disease 01/21/2014    Expected Discharge Date: Expected Discharge Date: 09/29/14  Team Members Present: Physician leading conference: Dr. Claudette Laws Social Worker Present: Dossie Der, LCSW Nurse Present: Carmie End, RN PT Present: Edman Circle, PT;Caroline Adriana Simas, PT;Other (comment) Luanne Bras Tygielski-PT) OT Present: Rosalio Loud, Kirkland Hun, OT SLP Present: Jackalyn Lombard, SLP PPS Coordinator present : Tora Duck, RN, CRRN     Current Status/Progress Goal Weekly Team Focus  Medical   desat off O2 and wth minimal activity  wean O2 and D/C PEG prior to D/C from CIR  Assess breathing, enc IS   Bowel/Bladder   continent of bowel and bladder (oliguric)  remain continent of bowela nd bladder  teach patient about signs and symptoms of constipation   Swallow/Nutrition/ Hydration     na        ADL's   mod assist bathing, mod assist UB dressing, max assist LB dressing, min-mod assist transfers, decreased activity tolerance  supervision overall  activity tolerance, transfers, dynamic standing balance   Mobility   minA with transfers, gait, min/modA stairs, severely reduced activity tolerance  supervision/minA overall gait, transfers, and w/c mobility  activity tolerance, LE strength, gait endurance/safety   Communication     na        Safety/Cognition/ Behavioral Observations    no unsafe behaviors  Pain   patient complains of pain in her left arm of 7/10  pain less than or equal to 4 on a scale of 0-10  assess pain q4h and prn, medicate as indicated   Skin   incision to lower abdomen, abrasion on right buttock, steristrips to left lower arm, J tube site to left lower quadrqnt  no new skin injury/breakdown  assess skin q shift, change dressings per order.      *See Care Plan and progress notes for long and short-term goals.  Barriers to Discharge: chronicdeconditioning     Possible Resolutions to Barriers:       Discharge Planning/Teaching Needs:  Home with daughter and son to assist, between both of them almost can provide 24 hr care.       Team Discussion:  Gaosl-supervision -min assist gait. Pain in left arm. Was premedicated prior to HD went better yesterday. Anxiety limits her in therapies. Ques PEG removal prior to discharge. Watching O2 sats-keep on if drops into low 80's. Repeat chest x-ray.  Wound looks better. Neuro-psych to see tomorrow  Revisions to Treatment Plan:  None   Continued Need for Acute Rehabilitation Level of Care: The patient requires daily medical management by a physician with specialized training in physical medicine and rehabilitation for the following conditions: Daily direction of a multidisciplinary physical rehabilitation program to ensure safe treatment while eliciting the highest outcome that is of practical value to the patient.: Yes Daily medical management of patient stability for increased activity during participation in an intensive rehabilitation regime.: Yes Daily analysis of laboratory values and/or radiology reports with any subsequent need for medication adjustment of medical intervention for : Other;Post surgical problems;Pulmonary problems;Cardiac problems  Ashima Shrake, Lemar Livings 09/16/2014, 1:19 PM

## 2014-09-16 NOTE — Progress Notes (Signed)
Social Work [name], female   DOB: June 27, 1955, 59 y.o.   MRN: [id number] Met with pt to discuss team conference goals-supervision/min level and discharge target 6/28. She feels pretty good today, but wants to be more independent before she leaves here. Informed it is up to her to work in therapies and regain her independence and it takes time to build up your strength and endurance again. She is aware the ultimate goal but may happen once home is [place]. She expects a lot from herself, discussed to give herself time to heal.  Will work on plans and talk with daughter, neuro-psych to see pt tomorrow.

## 2014-09-16 NOTE — Progress Notes (Signed)
Occupational Therapy Session Note  Patient Details  Name: [name] MRN: [id number] Date of Birth: 05-07-1955  Today's Date: 09/16/2014 OT Individual Time: 1031-2811 and 1400-1500 OT Individual Time Calculation (min): 60 min and 60 min   Short Term Goals: Week 1:  OT Short Term Goal 1 (Week 1): Pt will engage in 10 minutes of functional activity without O2 stats dropping below 90% while on 2L of O2 or less. OT Short Term Goal 2 (Week 1): Pt will perform toilet transfer onto standard toilet with min A in order to increase functional transfers. OT Short Term Goal 3 (Week 1): When bathing at sink, pt will perform with min A balance during STS. OT Short Term Goal 4 (Week 1): Pt will perform LB dressing with Mod A in order to decrease level of assistance for self care.  Skilled Therapeutic Interventions/Progress Updates:   1) Engaged in ADL retraining with focus on sit <> stand, standing tolerance, and increased participation in self-care tasks.  Pt in bed upon arrival on room air, O2 sats 80% on room air - reapplied 2L O2 for activity.  Stand step transfer bed > w/c with min assist with initial mod for sit > stand.  Bathing completed at sit > stand level at sink with mod assist for sit >stand with tactile cues at pelvis to promote forward weight shift and lift off from w/c.  Pt required assist with LB dressing with backward chaining for threading pants.  Pt donned bra and shirt this session with setup assist only.  Pt left with RN to complete dressing change to abdomen.  2) Engaged in therapeutic activity with focus on activity tolerance and sit <> stand. In therapy gym engaged in blocked practice of sit > stand to increase anterior weight shift as needed for sit > stand.  Pt progressed from requiring mod-close supervision with sit > stand this session from 21" therapy mat with BUE support pushing up from mat.  Lowered mat to 20.5" with pt requiring mod assist for forward weight shift for sit >  stand.  Engaged in functional ambulation with RW with pt ambulating 25 feet then 40 feet with RW and min assist.  Pt on room air throughout session, requiring multiple rest breaks but O2 remaining > 90% on room air.  Pt returned to bed at end of session to rest, left with all necessary items in reach.  Therapy Documentation Precautions:  Precautions Precautions: Fall Precaution Comments:  abdominal binder, abdominal wound, 2-3L 02 to maintain Sp02 >90% Other Brace/Splint: abdominal binder Restrictions Weight Bearing Restrictions: No General:   Vital Signs: Therapy Vitals Pulse Rate: 79 BP: 117/77 mmHg Patient Position (if appropriate): Lying Oxygen Therapy SpO2: 99 % O2 Device: Nasal Cannula O2 Flow Rate (L/min): 2 L/min Pain:  Pt with no c/o pain   See FIM for current functional status  Therapy/Group: Individual Therapy  Rosalio Loud 09/16/2014, 10:37 AM

## 2014-09-16 NOTE — Progress Notes (Signed)
Physical Therapy Session Note  Patient Details  Name: [name] MRN: [id number] Date of Birth: [birth year]  Today's Date: 09/16/2014 PT Individual Time: 1115-1200 and 4492-0100 PT Individual Time Calculation (min): 45 min (session 1) 45 min (session 2)  Short Term Goals: Week 1:  PT Short Term Goal 1 (Week 1): Pt will perform sit<>stand with UE support at min A level  PT Short Term Goal 2 (Week 1): Pt will perform stand pivot transfers with LRAD at min A level  PT Short Term Goal 3 (Week 1): Pt will tolerate dynamic standing task for 2 mins with SaO2 remaining in the 90's.  PT Short Term Goal 4 (Week 1): Pt will ambulate x 78' w/ LRAD at S min/guard level with SaO2 in the 90's.    Skilled Therapeutic Interventions/Progress Updates:    Session 1: Pt received seated in w/c with no c/o pain and agreeable to treatment. Treatment session performed without use of O2 [place]; pt desats to approximately 88% with intense activity, however recovers to WNL quickly (10-30 sec after completion of activity) and is asymptomatic throughout. Pt instructed in w/c propulsion from room to therapy gym with BUE and BLE with min/modA due to slow speed and poor LLE management/coordination/strength. Pt instructed in stair ascent/descent with alternating LE lead leg and BUE support on handrails. Pt requires mod/maxA and cues for body positioning and forward weight shift when performing sit <>stand, however performs stair ascent/descent with minA/CGA. Performed 4 stairs x 2 trials. Pt instructed in gait training with RW x40' with CGA; mod/maxA for sit <> stand. Gait performed with slow speed, inc weight bearing through UEs on RW, and reduced WB and weight shift to weaker L side. Pt reports requiring rest break; unable to determine if due to fatigue or fear/hesitancy of overexertion. Pt returned to room and remained seated in w/c with all needs within reach.   Session 2: Pt received in w/c with no c/o pain and agreeable to  treatment. Treatment session performed without use of O2 [place]; pt desats to approximately 88% with intense activity, however recovers to WNL quickly (10-30 sec after completion of activity) and is asymptomatic throughout. Pt instructed in standing activity tolerance while engaged in UE dynamic reaching activities. Pt able to tolerate approximately 7 min continuous standing activity at a time, however requires extended seated rest break between trials due to fatigue. Activity performed to improve standing tolerance and improve cardiovascular endurance, as well as LE strength and balance for carryover into functional gait and transfers. Pt instructed in seated LE therapeutic exercises for 1 set of 20 of each of the following on BLEs: knee extension, hip flexion, ankle ROM, glute sets. Pt again fatigues very quickly, even in seated position, requiring several rest breaks throughout performance. Pt remained seated in w/c with all needs within reach.   Therapy Documentation Precautions:  Precautions Precautions: Fall Precaution Comments:  abdominal binder, abdominal wound, 2-3L 02 to maintain Sp02 >90% Other Brace/Splint: abdominal binder Restrictions Weight Bearing Restrictions: No Vital Signs: Oxygen Therapy O2 Device: Not Delivered Pain: Pain Assessment Pain Assessment: No/denies pain Pain Score: 0-No pain Locomotion : Ambulation Ambulation/Gait Assistance: 4: Min guard   See FIM for current functional status  Therapy/Group: Individual Therapy  [name] 09/16/2014, 3:05 PM

## 2014-09-16 NOTE — Progress Notes (Signed)
Subjective:   'slowly but surely getting stronger'  Objective Filed Vitals:   09/16/14 0731 09/16/14 0739 09/16/14 0837 09/16/14 0840  BP:   117/77   Pulse:   79   Temp:      TempSrc:      Resp:      Height:      Weight:      SpO2: 100% 100% 80% 99%   Physical Exam General: alert and oriented, no acute distress.  Heart: RRR Lungs: CTA, unlabored Abdomen: soft, PEG tube. Dressing intact Extremities: +1LE edema Dialysis Access:  R IJ catheter/ L AVF +b  Assessment/Plan: 1. Pyloric ulcer perf s/p exlap complicated by duod fistula which has healed over (CT w po contrast neg for extravasation) 2. ESRD - new start to HD, on TTS, using R IJ catheter; AVF placed 01/28/14 by Dr. Darrick Penna, s/p revision 6/8 per Dr. Arbie Cookey, may use in 2 wks.  3. Hypotension/Volume - BP 117/77 LE edema. Cont volume removal as tolerated.  4. Anemia - Hgb 9.4, Aranesp 200 mcg on Thurs, started Fe 6/9. 5. 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 Nutrition - Alb 2.3, soft diet, vitamin.  7. C diff colitis - now negative. 8. Anxiety - Xanax pre-HD 9. CVA  Jetty Duhamel, NP [facility] Beeper [phone] 09/16/2014,1:02 PM  LOS: 5 days   Pt seen, examined and agree w A/P as above.  Stephanie Moselle MD pager [phone]    cell [phone] 09/16/2014, 3:13 PM    Additional Objective Labs: Basic Metabolic Panel:  Recent Labs Lab 09/10/14 0741 09/12/14 1700 09/15/14 1745  NA 134* 125* 129*  K 4.0 4.5 4.4  CL 95* 88* 91*  CO2 23 21* 22  GLUCOSE 108* 82 88  BUN 38* 51* 51*  CREATININE 4.67* 4.66* 5.11*  CALCIUM 8.0* 8.3* 8.1*  PHOS 8.2* 7.6* 7.8*   Liver Function Tests:  Recent Labs Lab 09/10/14 0741 09/12/14 1700 09/15/14 1745  AST 16  --   --   ALT 9*  --   --   ALKPHOS 130*  --   --   BILITOT 0.8  --   --   PROT 6.8  --   --   ALBUMIN 2.1* 2.4* 2.3*   No results for input(s): LIPASE, AMYLASE in the last 168 hours. CBC:  Recent Labs Lab  09/09/14 2120 09/10/14 0741 09/12/14 1700 09/15/14 1747  WBC 8.8 9.8 10.0 9.0  HGB 9.5* 8.8* 9.0* 9.4*  HCT 30.3* 27.9* 27.7* 29.5*  MCV 93.2 92.7 90.2 93.7  PLT 395 378 370 353   Blood Culture    Component Value Date/Time   SDES ABSCESS PELVIS 08/17/2014 1451   SPECREQUEST NONE 08/17/2014 1451   CULT  08/17/2014 1451    NO GROWTH 3 DAYS Performed at [facility]    REPTSTATUS 08/21/2014 FINAL 08/17/2014 1451    Cardiac Enzymes: No results for input(s): CKTOTAL, CKMB, CKMBINDEX, TROPONINI in the last 168 hours. CBG:  Recent Labs Lab 09/14/14 1958 09/15/14 0002 09/15/14 0354 09/15/14 1146 09/15/14 1206  GLUCAP 101* 79 97 65 86   Iron Studies: No results for input(s): IRON, TIBC, TRANSFERRIN, FERRITIN in the last 72 hours. @lablastinr3 @ Studies/Results: No results found. Medications:   . ALPRAZolam  0.5 mg Oral Q T,Th,Sa-HD  . amiodarone  400 mg Oral BID  . anticoagulant sodium citrate  5 mL Intravenous STAT  . aspirin  81 mg Oral Daily  . budesonide (PULMICORT)  nebulizer solution  0.25 mg Nebulization BID  . calcitRIOL  0.5 mcg Oral Q T,Th,Sat-1800  . chlorhexidine  15 mL Mouth Rinse BID  . citalopram  20 mg Oral Daily  . [START ON 09/17/2014] darbepoetin (ARANESP) injection - DIALYSIS  200 mcg Intravenous Q Thu-HD  . feeding supplement (NEPRO CARB STEADY)  237 mL Oral Q1500  . feeding supplement (PRO-STAT SUGAR FREE 64)  30 mL Per Tube BID  . feeding supplement (RESOURCE BREEZE)  1 Container Oral BID BM  . feeding supplement (VITAL AF 1.2 CAL)  1,000 mL Per Tube Q24H  . ferric gluconate (FERRLECIT/NULECIT) IV  125 mg Intravenous Q T,Th,Sa-HD  . ipratropium  0.5 mg Nebulization TID  . levalbuterol  0.63 mg Nebulization TID  . levothyroxine  25 mcg Oral QAC breakfast  . multivitamin  1 tablet Oral QHS  . vitamin C  250 mg Oral Daily

## 2014-09-16 NOTE — Progress Notes (Signed)
[facility] PHYSICAL MEDICINE & REHABILITATION     PROGRESS NOTE   CC "Don send me home today" Subjective/Complaints: Pt feels she is still weak but getting a little stronger No breathing problems in HD yest, premedicated with Xanax  ROS: Pt denies  rash/itching, headache, blurred or double vision, nausea, vomiting, abdominal pain, diarrhea, chest pain,, anxiety, or depression   Objective: Vital Signs: Blood pressure 117/77, pulse 79, temperature 98.3 F (36.8 C), temperature source Oral, resp. rate 19, height  (1.702 m), weight 99.927 kg (220 lb 4.8 oz), SpO2 99 %. No results found.  Recent Labs  09/15/14 1747  WBC 9.0  HGB 9.4*  HCT 29.5*  PLT 353    Recent Labs  09/15/14 1745  NA 129*  K 4.4  CL 91*  GLUCOSE 88  BUN 51*  CREATININE 5.11*  CALCIUM 8.1*   CBG (last 3)   Recent Labs  09/15/14 0354 09/15/14 1146 09/15/14 1206  GLUCAP 97 65 86    Wt Readings from Last 3 Encounters:  09/16/14 99.927 kg (220 lb 4.8 oz)  09/10/14 98.975 kg (218 lb 3.2 oz)  06/01/14 102.059 kg (225 lb)    Physical Exam:  Gen: alert, no distress, obese HENT:  Poor dentition,  Eyes: EOM are normal.  Neck: Normal range of motion. Neck supple. No thyromegaly present.  Cardiovascular: Normal rate and regular rhythm. no murmurs Respiratory: Effort normal and breath sounds normal. No respiratory distress. occ rhonchi and upper airway sounds. No cough today GI: Bowel sounds are normal.  Abdominal wound is dressed . G-tube site clean and dry , no drainage Skin: left AVG site clean, less tender, minimal edema Neurological: She is alert.  She is oriented to person place date of birth.  LUE 4/5 deltoid, bicep, tricep. 4/5 wrist/hand. RUE grossly 4+/5. LE: 3 RHF, 3 LHF, 3+ RKE, 3 LKE, ankles grossly 3+ to 4-/5. Resting tremor in arms/legs less apparent today. Intact LT BLE.   cooperative. Attention, awareness, insight are fair Psych: pleasant    Assessment/Plan: 1.  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 Physiatrist is providing close team supervision and 24 hour management of active medical problems listed below. Physiatrist and rehab team continue to assess barriers to discharge/monitor patient progress toward functional and medical goals. FIM: FIM - Bathing Bathing Steps Patient Completed: Chest, Right Arm, Left Arm, Abdomen, Front perineal area, Buttocks (only wanted to complete UB bathing) Bathing: 4: Steadying assist  FIM - Upper Body Dressing/Undressing Upper body dressing/undressing steps patient completed: Thread/unthread right bra strap, Thread/unthread left bra strap, Thread/unthread right sleeve of pullover shirt/dresss, Thread/unthread left sleeve of pullover shirt/dress, Put head through opening of pull over shirt/dress Upper body dressing/undressing: 3: Mod-Patient completed 50-74% of tasks FIM - Lower Body Dressing/Undressing Lower body dressing/undressing steps patient completed: Pull pants up/down (pt requesting to keep same pants on) Lower body dressing/undressing: 0: Wears gown/pajamas-no public clothing  FIM - Toileting Toileting steps completed by patient: Performs perineal hygiene Toileting Assistive Devices: Grab bar or rail for support Toileting: 2: Max-Patient completed 1 of 3 steps  FIM - Diplomatic Services operational officer Devices: Grab bars, Elevated toilet seat (BSC over toilet) Toilet Transfers: 3-To toilet/BSC: Mod A (lift or lower assist), 3-From toilet/BSC: Mod A (lift or lower assist)  FIM - Banker Devices: Manufacturing systems engineer Transfer: 5: Supine > Sit: Supervision (verbal cues/safety issues), 5: Sit >  Supine: Supervision (verbal cues/safety issues), 3: Bed > Chair or W/C: Mod A (lift or lower assist), 3: Chair or W/C > Bed: Mod A (lift or lower  assist)  FIM - Locomotion: Wheelchair Distance: 50 Locomotion: Wheelchair: 1: Total Assistance/staff pushes wheelchair (Pt<25%) FIM - Locomotion: Ambulation Locomotion: Ambulation Assistive Devices: Designer, industrial/product Ambulation/Gait Assistance: 4: Min guard Locomotion: Ambulation: 1: Travels less than 50 ft with supervision/safety issues  Comprehension Comprehension Mode: Auditory Comprehension: 5-Follows basic conversation/direction: With no assist  Expression Expression Mode: Verbal Expression: 5-Expresses basic needs/ideas: With no assist  Social Interaction Social Interaction: 5-Interacts appropriately 90% of the time - Needs monitoring or encouragement for participation or interaction.  Problem Solving Problem Solving: 5-Solves complex 90% of the time/cues < 10% of the time  Memory Memory: 5-Recognizes or recalls 90% of the time/requires cueing < 10% of the time  Medical Problem List and Plan: 1. 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 status post loop recorder/respiratory failure/multi-medical  -tolerating therapies thus far 2. DVT Prophylaxis/Anticoagulation: SCD.  Venous Doppler studies negative 3. Pain Management: Oxycodone as needed. Fair control at present 4. End-stage renal disease. Left arm AV fistula 09/09/2014. Hemodialysis as per renal services 5. Neuropsych: This patient is capable of making decisions on her own behalf. 6. Skin/Wound Care: Routine skin checks/dressing changes as advised to abdominal wound with wet-to-dry 7. Fluids/Electrolytes/Nutrition: . Check labs Monday  -continuous TF on board overnight--will cut in half to help increase appetite 8. Hx of C diff: placed on flagyl for one week given use of abx during this hospitalization 9. Hypertension/intermittent bouts of PSVT. Lopressor 12.5 mg twice a day--rate controlled. 10. Massive pneumoperitoneum and ascites. Status post exploratory laparotomy and closure of perforated pyloric ulcer  07/01/2014.  -maintain dressing changes per Gen surg. 11. Decreased nutritional storage. Advance diet as tolerated. Status post gastrostomy tube 08/12/2014 per interventional radiology 12. Hypothyroidism. Synthroid 13. Mood/anxiety. Ativan as needed 14. Intermittent cough, "?asthma"---pulmicort neb prn effective. May be anxiety component also, statred celexa, monitor 15.  hypoNa- per renal address in HD LOS (Days) 5 A FACE TO FACE EVALUATION WAS PERFORMED  Erick Colace 09/16/2014 8:46 AM

## 2014-09-17 ENCOUNTER — Inpatient Hospital Stay (HOSPITAL_COMMUNITY): Payer: Medicaid Other | Admitting: Physical Therapy

## 2014-09-17 ENCOUNTER — Inpatient Hospital Stay (HOSPITAL_COMMUNITY): Payer: Self-pay | Admitting: Occupational Therapy

## 2014-09-17 ENCOUNTER — Inpatient Hospital Stay (HOSPITAL_COMMUNITY): Payer: Medicaid Other | Admitting: Occupational Therapy

## 2014-09-17 ENCOUNTER — Encounter (HOSPITAL_COMMUNITY): Payer: Self-pay

## 2014-09-17 LAB — RENAL FUNCTION PANEL
Albumin: 2.4 g/dL — ABNORMAL LOW (ref 3.5–5.0)
Anion gap: 12 (ref 5–15)
BUN: 40 mg/dL — ABNORMAL HIGH (ref 6–20)
CHLORIDE: 93 mmol/L — AB (ref 101–111)
CO2: 24 mmol/L (ref 22–32)
Calcium: 8.4 mg/dL — ABNORMAL LOW (ref 8.9–10.3)
Creatinine, Ser: 4.73 mg/dL — ABNORMAL HIGH (ref 0.44–1.00)
GFR calc Af Amer: 11 mL/min — ABNORMAL LOW (ref >60)
GFR, EST NON AFRICAN AMERICAN: 9 mL/min — AB (ref >60)
Glucose, Bld: 95 mg/dL (ref 65–99)
POTASSIUM: 4.2 mmol/L (ref 3.5–5.1)
Phosphorus: 7.8 mg/dL — ABNORMAL HIGH (ref 2.5–4.6)
SODIUM: 129 mmol/L — AB (ref 135–145)

## 2014-09-17 LAB — CBC
HEMATOCRIT: 30.1 % — AB (ref 36.0–46.0)
Hemoglobin: 9.6 g/dL — ABNORMAL LOW (ref 12.0–15.0)
MCH: 30.1 pg (ref 26.0–34.0)
MCHC: 31.9 g/dL (ref 30.0–36.0)
MCV: 94.4 fL (ref 78.0–100.0)
PLATELETS: 320 10*3/uL (ref 150–400)
RBC: 3.19 MIL/uL — ABNORMAL LOW (ref 3.87–5.11)
RDW: 19.6 % — ABNORMAL HIGH (ref 11.5–15.5)
WBC: 6.7 10*3/uL (ref 4.0–10.5)

## 2014-09-17 MED ORDER — HEPARIN SODIUM (PORCINE) 1000 UNIT/ML DIALYSIS: 1000.0000 [IU] | INTRAMUSCULAR | Status: DC | PRN

## 2014-09-17 MED ORDER — PENTAFLUOROPROP-TETRAFLUOROETH EX AERO: 1.0000 "application " | INHALATION_SPRAY | CUTANEOUS | Status: DC | PRN

## 2014-09-17 MED ORDER — LIDOCAINE HCL (PF) 1 % IJ SOLN: 5.0000 mL | INTRAMUSCULAR | Status: DC | PRN

## 2014-09-17 MED ORDER — NEPRO/CARBSTEADY PO LIQD
237.0000 mL | ORAL | Status: DC | PRN
  Filled 2014-09-17: qty 237

## 2014-09-17 MED ORDER — LIDOCAINE-PRILOCAINE 2.5-2.5 % EX CREA
1.0000 "application " | TOPICAL_CREAM | CUTANEOUS | Status: DC | PRN
  Filled 2014-09-17: qty 5

## 2014-09-17 MED ORDER — DARBEPOETIN ALFA 200 MCG/0.4ML IJ SOSY
PREFILLED_SYRINGE | INTRAMUSCULAR | Status: AC
  Administered 2014-09-17: 200 ug
  Filled 2014-09-17: qty 0.4

## 2014-09-17 MED ORDER — SODIUM CHLORIDE 0.9 % IV SOLN
100.0000 mL | INTRAVENOUS | Status: DC | PRN
Start: 2014-09-17 — End: 2014-09-17

## 2014-09-17 MED ORDER — SODIUM CHLORIDE 0.9 % IV SOLN: 100.0000 mL | INTRAVENOUS | Status: DC | PRN

## 2014-09-17 MED ORDER — ALTEPLASE 2 MG IJ SOLR
2.0000 mg | Freq: Once | INTRAMUSCULAR | Status: DC | PRN
Start: 2014-09-17 — End: 2014-09-17
  Filled 2014-09-17: qty 2

## 2014-09-17 MED ORDER — PRO-STAT SUGAR FREE PO LIQD
30.0000 mL | Freq: Three times a day (TID) | ORAL | Status: DC
  Administered 2014-09-17 – 2014-09-29 (×17): 30 mL
  Filled 2014-09-17 (×38): qty 30

## 2014-09-17 NOTE — Progress Notes (Signed)
Physical Therapy Session Note  Patient Details  Name: [name] MRN: [id number] Date of Birth: 01-Jan-1956  Today's Date: 09/17/2014 PT Individual Time: 1315-1430 PT Individual Time Calculation (min): 75 min   Short Term Goals: Week 1:  PT Short Term Goal 1 (Week 1): Pt will perform sit<>stand with UE support at min A level  PT Short Term Goal 2 (Week 1): Pt will perform stand pivot transfers with LRAD at min A level  PT Short Term Goal 3 (Week 1): Pt will tolerate dynamic standing task for 2 mins with SaO2 remaining in the 90's.  PT Short Term Goal 4 (Week 1): Pt will ambulate x 46' w/ LRAD at S min/guard level with SaO2 in the 90's.    Skilled Therapeutic Interventions/Progress Updates:    Pt received seated in w/c with no c/o pain and agreeable to treatment. Pt agreeable to going outside and reports it would be the first time going outside since March. Pt instructed in gait for 2 trials of 63' with RW on level/unlevel surfaces (pavement and brick) with supervision. Requires extended seated rest break following each trial as well as demonstrates slower performance and 2 short standing rest breaks during second trial indicative of inc fatigue. Pt instructed in seated LE strengthening exercises for 2 sets 15 reps of long arc quad and hip flexion/marching; again requiring rest breaks between activities due to fatigue. Pt instructed in stair climbing ascent/descent of 5 brick stairs (approx 5" height) using R handrail. On ascent performed R hand on handrail and L HHA, on descent pt performs sideways stepping with both UEs on railing.  Transfer w/c <> elevated toilet seat performed x2 repetitions during treatment session d/t pt urgency; performed with minA/supervision, grab bars, and no lifting assist needed. Pt instructed in standing activity for 2 trials of approximately 5 min with rest breaks needed d/t fatigue. Activity performed to improve standing endurance and balance for carryover into gait  and functional adls. Pt returned to room and remained seated in w/c with all needs within reach; RN notified of bowel movement.   Therapy Documentation Precautions:  Precautions Precautions: Fall Precaution Comments:  abdominal binder, abdominal wound, 2-3L 02 to maintain Sp02 >90% Other Brace/Splint: abdominal binder Restrictions Weight Bearing Restrictions: No Pain: Pain Assessment Pain Assessment: No/denies pain Pain Score: 0-No pain Locomotion : Ambulation Ambulation/Gait Assistance: 4: Min guard   See FIM for current functional status  Therapy/Group: Individual Therapy  [name] 09/17/2014, 2:44 PM

## 2014-09-17 NOTE — Progress Notes (Signed)
Nutrition Follow-up  DOCUMENTATION CODES:  Obesity unspecified  INTERVENTION: Provide Resource Breeze po BID, each supplement provides 250 kcal and 9 grams of protein  Provide Nepro Shake po once daily., each supplement provides 425 kcal and 19 grams protein  Encourage adequate PO intake.   Nocturnal TF regimen: Continue Vital AF 1.2 formula at 35 ml/hr x 6 hours (infuse 8 PM to 2 AM) with 30 ml Prostat TID via J-tube to provide 552 kcals, 61 gm protein, 170 ml of free water   RD to continue to monitor.   NUTRITION DIAGNOSIS:  Increased nutrient needs related to chronic illness as evidenced by estimated needs; ongoing  GOAL:  Patient will meet greater than or equal to 90% of their needs; not met  MONITOR:  PO intake, Supplement acceptance, TF tolerance, Weight trends, Labs, I & O's  REASON FOR ASSESSMENT:  Consult Calorie Count  ASSESSMENT: Pt with history of stage V chronic kidney disease status post fistula placement now on HD, hypertension, tobacco abuse. Presented 06/17/2014 with progressive SOB 2 weeks and lower extremity edema. 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 CT scan showed massive pneumoperitoneum and ascites and also pneumatosis of a short segment of the ileum. Pt underwent exploratory laparotomy evacuation of ascites with closure of perforated pyloric ulcer with omental patch and segmental resection of ileum 07/01/2014. G-J tube placed 5/11.  Nocturnal tube feeding orders have been modified to run at 35 ml/hr x 6 hours to encourage PO intake. Meal completion has been 10-50%. Pt has been consuming her supplements intermittently. RD to increase Prostat per tube to aid in protein intake. Continue to encourage adequate PO intake. RD to continue to monitor.   Labs: Low sodium, potassium, calcium, GFR. High BUN, creatinine, and phosphorous (7.8).  Height:  Ht Readings from  Last 1 Encounters:  09/11/14 $RemoveB'5\' 7"'[id number]$  (1.702 m)    Weight:  Wt Readings from Last 1 Encounters:  09/17/14 225 lb 8.5 oz (102.3 kg)    Ideal Body Weight:  61 kg  Wt Readings from Last 10 Encounters:  09/17/14 225 lb 8.5 oz (102.3 kg)  09/10/14 218 lb 3.2 oz (98.975 kg)  06/01/14 225 lb (102.059 kg)  01/28/14 230 lb (104.327 kg)  01/21/14 231 lb 1.6 oz (104.826 kg)  10/02/12 216 lb 8 oz (98.204 kg)    BMI:  Body mass index is 35.31 kg/(m^2).  Estimated Nutritional Needs:  Kcal:  2100-2300  Protein:  120-140 grams  Fluid:  Per MD  Skin:    Stage I pressure ulcer on sacrum, incision on abdomen, arm, puncture on abdomen, non-pitting UE, +2 LLE edema  Diet Order:  DIET SOFT Room service appropriate?: Yes; Fluid consistency:: Thin  EDUCATION NEEDS:  No education needs identified at this time   Intake/Output Summary (Last 24 hours) at 09/17/14 1153 Last data filed at 09/17/14 0825  Gross per 24 hour  Intake    400 ml  Output      0 ml  Net    400 ml    Last BM:  6/15  Corrin Parker, MS, RD, LDN Pager # [phone] After hours/ weekend pager # [phone]

## 2014-09-17 NOTE — Progress Notes (Signed)
Occupational Therapy Session Note  Patient Details  Name: [name] MRN: [id number] Date of Birth: 1955/09/05  Today's Date: 09/17/2014 OT Individual Time: 5284-1324 OT Individual Time Calculation (min): 73 min    Short Term Goals: Week 1:  OT Short Term Goal 1 (Week 1): Pt will engage in 10 minutes of functional activity without O2 stats dropping below 90% while on 2L of O2 or less. OT Short Term Goal 2 (Week 1): Pt will perform toilet transfer onto standard toilet with min A in order to increase functional transfers. OT Short Term Goal 3 (Week 1): When bathing at sink, pt will perform with min A balance during STS. OT Short Term Goal 4 (Week 1): Pt will perform LB dressing with Mod A in order to decrease level of assistance for self care.  Skilled Therapeutic Interventions/Progress Updates:    Engaged in ADL retraining with focus on sit <> stand, standing tolerance, and increased participation in self-care tasks.  Toilet transfer w/c > BSC placed over toilet to increase height, with pt requiring min assist for transfer.  Bathing completed at sit >stand level at sink with pt able to complete all sit > stands this session with close supervision, not requiring any physical assist for lifting or steadying this session.  Pt unable to thread Lt pant leg this session requiring assist.  Engaged in ankle pumps in LLE to attempt to decrease edema in LLE with pt able to complete 2 sets of 10, requiring cues for technique.  Engaged in blocked training of sit > stand from w/c with pt able to complete all sit > stands this session with supervision and increased time.  Pt tolerated entire session on room air with sats staying > 92%.  Therapy Documentation Precautions:  Precautions Precautions: Fall Precaution Comments:  abdominal binder, abdominal wound, 2-3L 02 to maintain Sp02 >90% Other Brace/Splint: abdominal binder Restrictions Weight Bearing Restrictions: No General:   Vital Signs: Oxygen  Therapy SpO2: 96 % O2 Device: Not Delivered Pain:  Pt with c/o pain in lower back, RN Notified.  Meds provided during session.  See FIM for current functional status  Therapy/Group: Individual Therapy  Rosalio Loud 09/17/2014, 12:03 PM

## 2014-09-17 NOTE — Progress Notes (Signed)
Occupational Therapy Session Note  Patient Details  Name: [name] MRN: [id number] Date of Birth: [birth year]  Today's Date: 09/17/2014 OT Individual Time: 1100-1200 OT Individual Time Calculation (min): 60 min    Short Term Goals: Week 1:  OT Short Term Goal 1 (Week 1): Pt will engage in 10 minutes of functional activity without O2 stats dropping below 90% while on 2L of O2 or less. OT Short Term Goal 2 (Week 1): Pt will perform toilet transfer onto standard toilet with min A in order to increase functional transfers. OT Short Term Goal 3 (Week 1): When bathing at sink, pt will perform with min A balance during STS. OT Short Term Goal 4 (Week 1): Pt will perform LB dressing with Mod A in order to decrease level of assistance for self care.  Skilled Therapeutic Interventions/Progress Updates:  Upon entering the room, pt seated in wheelchair with no c/o pain this session. Pt reporting feeling very overwhelmed and anxious about current situation, dialysis, and returning home. OT discussing current goals and educated pt on techniques such as visual imagery and pursed lip breathing when having these concerns. Pt verbalizing understanding and education to continue. OT demonstrated bicep curls and chest pulls with use of level 2 orange resistive theraband for B UE strengthening. Pt returned demonstrations x 10 reps each with B UEs and rest breaks secondary to fatigue. OT propelled pt via wheelchair and total A for energy conservation to ADL apartment. OT educated and demonstrate transfer onto TTB with use of RW in tub/shower combination. Pt ambulated short distance of 10' with RW and min A for simulated transfer onto TTB. Pt required assistance to place L LE into and out of tub. Pt very anxious about transfer and additional education provided in order to decrease fears. Pt returning to wheelchair in same manner with RW. Pt propelled wheelchair 100' with increased time and supervision. Pt fatigued but  O2 sat and HR remaining WFLs. OT assisted pt the rest of the way back to the room. Call bell and all needed items within reach upon exiting.   Therapy Documentation Precautions:  Precautions Precautions: Fall Precaution Comments:  abdominal binder, abdominal wound, 2-3L 02 to maintain Sp02 >90% Other Brace/Splint: abdominal binder Restrictions Weight Bearing Restrictions: No Vital Signs: Oxygen Therapy SpO2: 96 % O2 Device: Not Delivered  See FIM for current functional status  Therapy/Group: Individual Therapy  Lowella Grip 09/17/2014, 12:34 PM

## 2014-09-17 NOTE — Progress Notes (Signed)
[facility] PHYSICAL MEDICINE & REHABILITATION     PROGRESS NOTE   CC Weakness Subjective/Complaints:  Discussed d/c date and need to work hard in therapy Appetite is fair this am, No IV meds or fluids Still has G tube Also has diatek cath ROS: Pt denies  rash/itching, headache, blurred or double vision, nausea, vomiting, abdominal pain, diarrhea, chest pain,, anxiety, or depression   Objective: Vital Signs: Blood pressure 99/65, pulse 18, temperature 98.2 F (36.8 C), temperature source Oral, resp. rate 18, height  (1.702 m), weight 102.3 kg (225 lb 8.5 oz), SpO2 97 %. Dg Chest 2 View  09/16/2014   CLINICAL DATA:  Shortness of breath, weakness  EXAM: CHEST  2 VIEW  COMPARISON:  Prior chest x-ray 09/01/2014  FINDINGS: Interval change in the configuration of the left IJ approach tunneled PICC. The catheter tip has flipped up into the right brachiocephalic vein. Tunneled right IJ central venous catheter remains in unchanged position with the tips in the SVC. Stable cardiomegaly. No focal airspace consolidation, pleural effusion, pulmonary edema or pneumothorax. Mild vascular congestion without interval change. No acute osseous abnormality.  IMPRESSION: 1. The tip of the left IJ approach tunneled PICC has flipped up into the right brachiocephalic vein. 2. The tip of the right IJ approach tunneled hemodialysis catheter remains unchanged overlying the SVC. 3. Stable cardiomegaly without evidence of acute cardiopulmonary process.   Electronically Signed   By: Malachy Moan M.D.   On: 09/16/2014 15:01    Recent Labs  09/15/14 1747  WBC 9.0  HGB 9.4*  HCT 29.5*  PLT 353    Recent Labs  09/15/14 1745  NA 129*  K 4.4  CL 91*  GLUCOSE 88  BUN 51*  CREATININE 5.11*  CALCIUM 8.1*   CBG (last 3)   Recent Labs  09/15/14 0354 09/15/14 1146 09/15/14 1206  GLUCAP 97 65 86    Wt Readings from Last 3 Encounters:  09/17/14 102.3 kg (225 lb 8.5 oz)  09/10/14 98.975 kg (218  lb 3.2 oz)  06/01/14 102.059 kg (225 lb)    Physical Exam:  Gen: alert, no distress, obese HENT:  Poor dentition,  Eyes: EOM are normal.  Neck: Normal range of motion. Neck supple. No thyromegaly present.  Cardiovascular: Normal rate and regular rhythm. no murmurs Respiratory: Effort normal and breath sounds normal. No respiratory distress. occ rhonchi and upper airway sounds. No cough today GI: Bowel sounds are normal.  Abdominal wound is dressed . G-tube site clean and dry , no drainage Skin: left AVG site clean, less tender, minimal edema Neurological: She is alert.  She is oriented to person place date of birth.  LUE 4/5 deltoid, bicep, tricep. 4/5 wrist/hand. RUE grossly 4+/5. LE: 3 RHF, 3 LHF, 3+ RKE, 3 LKE, ankles grossly 3+ to 4-/5. Resting tremor in arms/legs less apparent today. Intact LT BLE.   cooperative. Attention, awareness, insight are fair Psych: pleasant    Assessment/Plan: 1. 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 Physiatrist is providing close team supervision and 24 hour management of active medical problems listed below. Physiatrist and rehab team continue to assess barriers to discharge/monitor patient progress toward functional and medical goals. CXR shows no infiltrate or pulm edema,  Should be able to d/c central line +/- PEG prior to d/c FIM: FIM - Bathing Bathing Steps Patient Completed: Chest, Right Arm, Left Arm, Abdomen, Front perineal area, Buttocks,  Right upper leg, Left upper leg Bathing: 4: Min-Patient completes 8-9 68f 10 parts or 75+ percent  FIM - Upper Body Dressing/Undressing Upper body dressing/undressing steps patient completed: Thread/unthread right bra strap, Thread/unthread left bra strap, Thread/unthread right sleeve of pullover shirt/dresss, Thread/unthread left sleeve of pullover shirt/dress, Put head through opening  of pull over shirt/dress, Hook/unhook bra, Pull shirt over trunk Upper body dressing/undressing: 5: Set-up assist to: Obtain clothing/put away FIM - Lower Body Dressing/Undressing Lower body dressing/undressing steps patient completed: Pull pants up/down, Don/Doff right shoe, Thread/unthread right pants leg Lower body dressing/undressing: 2: Max-Patient completed 25-49% of tasks  FIM - Toileting Toileting steps completed by patient: Performs perineal hygiene Toileting Assistive Devices: Grab bar or rail for support Toileting: 2: Max-Patient completed 1 of 3 steps  FIM - Diplomatic Services operational officer Devices: Grab bars, Elevated toilet seat (BSC over toilet) Toilet Transfers: 3-To toilet/BSC: Mod A (lift or lower assist), 3-From toilet/BSC: Mod A (lift or lower assist)  FIM - Banker Devices: Arm rests, HOB elevated Bed/Chair Transfer: 5: Supine > Sit: Supervision (verbal cues/safety issues), 3: Bed > Chair or W/C: Mod A (lift or lower assist)  FIM - Locomotion: Wheelchair Distance: 50 Locomotion: Wheelchair: 2: Travels 50 - 149 ft with moderate assistance (Pt: 50 - 74%) FIM - Locomotion: Ambulation Locomotion: Ambulation Assistive Devices: Designer, industrial/product Ambulation/Gait Assistance: 4: Min guard Locomotion: Ambulation: 1: Travels less than 50 ft with supervision/safety issues  Comprehension Comprehension Mode: Auditory Comprehension: 5-Follows basic conversation/direction: With no assist  Expression Expression Mode: Verbal Expression: 5-Expresses basic needs/ideas: With no assist  Social Interaction Social Interaction: 5-Interacts appropriately 90% of the time - Needs monitoring or encouragement for participation or interaction.  Problem Solving Problem Solving: 5-Solves complex 90% of the time/cues < 10% of the time  Memory Memory: 5-Recognizes or recalls 90% of the time/requires cueing < 10% of the time  Medical  Problem List and Plan: 1. 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 status post loop recorder/respiratory failure/multi-medical  -tolerating therapies thus far 2. DVT Prophylaxis/Anticoagulation: SCD.  Venous Doppler studies negative 3. Pain Management: Oxycodone as needed. Fair control at present 4. End-stage renal disease. Left arm AV fistula 09/09/2014. Hemodialysis as per renal services 5. Neuropsych: This patient is capable of making decisions on her own behalf. 6. Skin/Wound Care: Routine skin checks/dressing changes as advised to abdominal wound with wet-to-dry 7. Fluids/Electrolytes/Nutrition: . Check labs Monday  -continuous TF on board overnight--will cut in half to help increase appetite 8. Hx of C diff: placed on flagyl for one week given use of abx during this hospitalization 9. Hypertension/intermittent bouts of PSVT. Lopressor 12.5 mg twice a day--rate controlled. 10. Massive pneumoperitoneum and ascites. Status post exploratory laparotomy and closure of perforated pyloric ulcer 07/01/2014.  -maintain dressing changes per Gen surg. 11. Decreased nutritional storage. Advance diet as tolerated. Status post gastrostomy tube 08/12/2014 per interventional radiology 12. Hypothyroidism. Synthroid 13. Mood/anxiety. Ativan as needed 14. Intermittent cough, "?asthma"---pulmicort neb prn effective. May be anxiety component also, statred celexa, monitor 15.  hypoNa- per renal address in HD LOS (Days) 6 A FACE TO FACE EVALUATION WAS PERFORMED  KIRSTEINS,ANDREW E 09/17/2014 8:03 AM

## 2014-09-18 ENCOUNTER — Inpatient Hospital Stay (HOSPITAL_COMMUNITY): Payer: Self-pay | Admitting: Physical Therapy

## 2014-09-18 ENCOUNTER — Inpatient Hospital Stay (HOSPITAL_COMMUNITY): Payer: Medicaid Other | Admitting: Occupational Therapy

## 2014-09-18 NOTE — Progress Notes (Signed)
Occupational Therapy Weekly Progress Note  Patient Details  Name: [name] MRN: [id number] Date of Birth: Mar 29, 1956  Beginning of progress report period: Azura 11, 2016 End of progress report period: Lisabeth 17, 2016  Today's Date: 09/18/2014 OT Individual Time: 0930-1040 and 1430-1455 OT Individual Time Calculation (min): 70 min and 25 min   Patient has met 3 of 4 short term goals. Pt is making steady progress towards goals.  Pt currently requires min assist with transfers from w/c level and supervision-mod assist with sit > stand dependent upon floor to seat height.  Pt continues to be fearful of falling and requires cues for LB bathing and dressing to increase participation.  Pt is maintaining O2 sats > 90% on room air during therapy sessions, occasionally desaturating at night.  Patient continues to demonstrate the following deficits: decreased activity tolerance, muscle weakness, decreased cardiorespiratoy endurance and decreased sitting balance, decreased standing balance, decreased postural control, decreased balance strategies and Lt hemiparesis and therefore will continue to benefit from skilled OT intervention to enhance overall performance with BADL and Reduce care partner burden.  Patient progressing toward long term goals..  Continue plan of care.  OT Short Term Goals Week 1:  OT Short Term Goal 1 (Week 1): Pt will engage in 10 minutes of functional activity without O2 stats dropping below 90% while on 2L of O2 or less. OT Short Term Goal 1 - Progress (Week 1): Met OT Short Term Goal 2 (Week 1): Pt will perform toilet transfer onto standard toilet with min A in order to increase functional transfers. OT Short Term Goal 2 - Progress (Week 1): Met OT Short Term Goal 3 (Week 1): When bathing at sink, pt will perform with min A balance during STS. OT Short Term Goal 3 - Progress (Week 1): Met OT Short Term Goal 4 (Week 1): Pt will perform LB dressing with Mod A in order to  decrease level of assistance for self care. OT Short Term Goal 4 - Progress (Week 1): Progressing toward goal Week 2:  OT Short Term Goal 1 (Week 2): Pt will perform LB dressing with Mod A in order to decrease level of assistance for self care. OT Short Term Goal 2 (Week 2): Pt will complete toilet transfer with RW at supervision level OT Short Term Goal 3 (Week 2): Pt will complete bathing at sink with supervision at sit > stand level OT Short Term Goal 4 (Week 2): Pt will complete 2/3 toileting tasks with supervision  Skilled Therapeutic Interventions/Progress Updates:    1) Engaged in therapeutic activity with focus on endurance and overall activity tolerance.  Pt completed grooming tasks seated at sink this session.  Therapist pushed w/c to ADL apt, engaged in functional ambulation with RW in apt to simulate home setup with min assist for safety.  Educated on seating choices at home with recommendation for higher, sturdy seating after attempting sit > stand from couch with pt requiring mod-max assist from therapist.  In therapy gym engaged in sit <> stand from mat approx 20" with pt completing stand on initial attempt with supervision but as session progressed pt required min assist.  Seated balance activity with focus on bending forward to retrieve items then dribbling large therapy ball with Lt hand then alternating hands.  Standing activity with UE support on RW with alternating tapping on floor target to promote weight shifting onto LLE in standing and with stepping.  Pt required multiple rests breaks even during seated tasks this  session.    2) Engaged in therapeutic activity with focus on sit <> stand, standing tolerance, and overall activity tolerance.  Pt received in w/c willing to participate in treatment session.  In therapy gym engaged in sit <> stand from w/c with supervision throughout session.  Engaged in [place] bowling activity with focus on standing tolerance, pt required seated rest break  after each turn.  Pt with increase in coughing and chest congestion requesting to return to room and have respiratory called to receive breathing treatment.  Therapy Documentation Precautions:  Precautions Precautions: Fall Precaution Comments:  abdominal binder, abdominal wound, 2-3L 02 to maintain Sp02 >90% Other Brace/Splint: abdominal binder Restrictions Weight Bearing Restrictions: No General:   Vital Signs: Therapy Vitals Temp: 97.8 F (36.6 C) Temp Source: Oral Pulse Rate: 89 Resp: 17 BP: (!) 85/56 mmHg Oxygen Therapy SpO2: 95 % O2 Device: Nasal Cannula O2 Flow Rate (L/min): 2 L/min Pain:  Pt with no c/o pain  See FIM for current functional status  Therapy/Group: Individual Therapy  Simonne Come 09/18/2014, 8:25 AM

## 2014-09-18 NOTE — Progress Notes (Addendum)
PHYSICAL MEDICINE & REHABILITATION     PROGRESS NOTE   CCLeft leg swollen No pain   Subjective/Complaints:   LLE Dopplers - on 5/3 and 6/3 Recently placed fistula left arm, some pain when using Left arm No IV meds or fluids Still has GJ tube Also has diatek cath ROS: Pt denies  nausea, vomiting, abdominal pain, diarrhea, chest pain,, anxiety, or depression  Increasing loose stools no abd pain , per d/c summ low threshold for recheck of C diff  Objective: Vital Signs: Blood pressure 85/56, pulse 89, temperature 97.8 F (36.6 C), temperature source Oral, resp. rate 17, height 5\' 7"  (1.702 m), weight 100 kg (220 lb 7.4 oz), SpO2 95 %. Dg Chest 2 View  09/16/2014   CLINICAL DATA:  Shortness of breath, weakness  EXAM: CHEST  2 VIEW  COMPARISON:  Prior chest x-ray 09/01/2014  FINDINGS: Interval change in the configuration of the left IJ approach tunneled PICC. The catheter tip has flipped up into the right brachiocephalic vein. Tunneled right IJ central venous catheter remains in unchanged position with the tips in the SVC. Stable cardiomegaly. No focal airspace consolidation, pleural effusion, pulmonary edema or pneumothorax. Mild vascular congestion without interval change. No acute osseous abnormality.  IMPRESSION: 1. The tip of the left IJ approach tunneled PICC has flipped up into the right brachiocephalic vein. 2. The tip of the right IJ approach tunneled hemodialysis catheter remains unchanged overlying the SVC. 3. Stable cardiomegaly without evidence of acute cardiopulmonary process.   Electronically Signed   By: Malachy Moan M.D.   On: 09/16/2014 15:01    Recent Labs  09/15/14 1747 09/17/14 1630  WBC 9.0 6.7  HGB 9.4* 9.6*  HCT 29.5* 30.1*  PLT 353 320    Recent Labs  09/15/14 1745 09/17/14 1630  NA 129* 129*  K 4.4 4.2  CL 91* 93*  GLUCOSE 88 95  BUN 51* 40*  CREATININE 5.11* 4.73*  CALCIUM 8.1* 8.4*   CBG (last 3)   Recent Labs  09/15/14 1146  09/15/14 1206  GLUCAP 65 86    Wt Readings from Last 3 Encounters:  09/17/14 100 kg (220 lb 7.4 oz)  09/10/14 98.975 kg (218 lb 3.2 oz)  06/01/14 102.059 kg (225 lb)    Physical Exam:  Gen: alert, no distress, obese HENT:  Poor dentition,  Eyes: EOM are normal.  Neck: Normal range of motion. Neck supple. No thyromegaly present.  Cardiovascular: Normal rate and regular rhythm. no murmurs Respiratory: Effort normal and breath sounds normal. No respiratory distress. occ rhonchi and upper airway sounds. No cough today GI: Bowel sounds are normal.  Abdominal wound is dressed . G-tube site clean and dry , no drainage Skin: left AVG site clean, less tender, minimal edema, 2+ edema RLE, trace edema RLE Neurological: She is alert.  She is oriented to person place date of birth.  LUE 4/5 deltoid, bicep, tricep. 4/5 wrist/hand. RUE grossly 4+/5. LE: 3 RHF, 3 LHF, 3+ RKE, 3 LKE, ankles grossly 3+ to 4-/5. Resting tremor in arms/legs less apparent today. Intact LT BLE.   cooperative. Attention, awareness, insight are fair Psych: pleasant    Assessment/Plan: 1. 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 Physiatrist is providing close team supervision and 24 hour management of active medical problems listed below. Physiatrist and rehab team continue to assess barriers to discharge/monitor patient progress toward functional and medical goals.  Should be able to d/c central line +/- GJ prior to d/c FIM: FIM - Bathing Bathing Steps Patient Completed: Chest, Right Arm, Left Arm, Abdomen, Front perineal area, Buttocks, Right upper leg, Left upper leg Bathing: 4: Min-Patient completes 8-9 83f 10 parts or 75+ percent  FIM - Upper Body Dressing/Undressing Upper body dressing/undressing steps patient completed: Thread/unthread right bra strap, Thread/unthread left bra strap,  Thread/unthread right sleeve of pullover shirt/dresss, Thread/unthread left sleeve of pullover shirt/dress, Put head through opening of pull over shirt/dress, Hook/unhook bra, Pull shirt over trunk Upper body dressing/undressing: 5: Set-up assist to: Obtain clothing/put away FIM - Lower Body Dressing/Undressing Lower body dressing/undressing steps patient completed: Thread/unthread right pants leg, Pull pants up/down, Don/Doff right shoe Lower body dressing/undressing: 2: Max-Patient completed 25-49% of tasks  FIM - Toileting Toileting steps completed by patient: Performs perineal hygiene, Adjust clothing after toileting Toileting Assistive Devices: Grab bar or rail for support Toileting: 3: Mod-Patient completed 2 of 3 steps  FIM - Diplomatic Services operational officer Devices: Grab bars, Elevated toilet seat Toilet Transfers: 4-To toilet/BSC: Min A (steadying Pt. > 75%), 4-From toilet/BSC: Min A (steadying Pt. > 75%)  FIM - Bed/Chair Transfer Bed/Chair Transfer Assistive Devices: Arm rests, HOB elevated Bed/Chair Transfer: 5: Supine > Sit: Supervision (verbal cues/safety issues), 3: Bed > Chair or W/C: Mod A (lift or lower assist)  FIM - Locomotion: Wheelchair Distance: 50 Locomotion: Wheelchair: 2: Travels 50 - 149 ft with moderate assistance (Pt: 50 - 74%) FIM - Locomotion: Ambulation Locomotion: Ambulation Assistive Devices: Designer, industrial/product Ambulation/Gait Assistance: 4: Min guard Locomotion: Ambulation: 2: Travels 50 - 149 ft with supervision/safety issues  Comprehension Comprehension Mode: Auditory Comprehension: 5-Follows basic conversation/direction: With no assist  Expression Expression Mode: Verbal Expression: 5-Expresses basic needs/ideas: With extra time/assistive device  Social Interaction Social Interaction: 5-Interacts appropriately 90% of the time - Needs monitoring or encouragement for participation or interaction.  Problem Solving Problem Solving:  5-Solves basic 90% of the time/requires cueing < 10% of the time  Memory Memory: 5-Recognizes or recalls 90% of the time/requires cueing < 10% of the time  Medical Problem List and Plan: 1. 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 status post loop recorder/respiratory failure/multi-medical  -tolerating therapies thus far 2. DVT Prophylaxis/Anticoagulation: SCD.  Venous Doppler studies negative, can use TEDs, no need for repeat Doppler 3. Pain Management: Oxycodone as needed. Fair control at present 4. End-stage renal disease. Left arm AV fistula 09/09/2014. Hemodialysis as per renal services 5. Neuropsych: This patient is capable of making decisions on her own behalf. 6. Skin/Wound Care: Routine skin checks/dressing changes as advised to abdominal wound with wet-to-dry, pitting edema worse on hemiparetic side 7. Fluids/Electrolytes/Nutrition: . Check labs Monday  -continuous TF on board overnight--will cut in half to help increase appetite 8. Hx of C diff: placed on flagyl for one week given use of abx during this hospitalization 9. Hypertension/intermittent bouts of PSVT. Lopressor 12.5 mg twice a day--rate controlled. 10. Massive pneumoperitoneum and ascites. Status post exploratory laparotomy and closure of perforated pyloric ulcer 07/01/2014.  -maintain dressing changes per Gen surg. 11. Decreased nutritional storage. Advance diet as tolerated. Status post GJ tube 08/12/2014 per interventional radiology 12. Hypothyroidism. Synthroid 13. Mood/anxiety. Ativan as needed 14. Intermittent cough, "?asthma"---pulmicort neb prn effective. May be anxiety component also, statred celexa, monitor 15.  hypoNa- per renal address in HD LOS (Days) 7 A FACE TO FACE EVALUATION WAS PERFORMED  Erick Colace 09/18/2014 8:23 AM

## 2014-09-18 NOTE — Progress Notes (Signed)
Physical Therapy Session Note  Patient Details  Name: [name] MRN: [id number] Date of Birth: [birth year]  Today's Date: 09/18/2014 PT Individual Time: 0800-0915 PT Individual Time Calculation (min): 75 min   Short Term Goals: Week 1:  PT Short Term Goal 1 (Week 1): Pt will perform sit<>stand with UE support at min A level  PT Short Term Goal 2 (Week 1): Pt will perform stand pivot transfers with LRAD at min A level  PT Short Term Goal 3 (Week 1): Pt will tolerate dynamic standing task for 2 mins with SaO2 remaining in the 90's.  PT Short Term Goal 4 (Week 1): Pt will ambulate x 76' w/ LRAD at S min/guard level with SaO2 in the 90's.    Skilled Therapeutic Interventions/Progress Updates:    Pt received supine in bed, agreeable to treatment. C/o pain in L abdominal region as described below; pt received medication from RN at beginning of therapy session. Pt instructed in ambulation bed <> bathroom with RW and min guard. Performed stand step transfer to toilet with min guard. Performed doffing of pants/brief, and perineal hygiene independently, with mod A needed to don brief. Pt performed sitting on EOB while performing dynamic UE activities and cognitive dual task while eating to improve sitting balance and endurance for carryover into functional adls. Pt instructed in gait training using RW and min guard/supervision x60 ft and occasional standing rest breaks due to fatigue. Pt demonstrates increased gait speed when fatiguing in order to return quickly to bed; educated pt in safety during gait and pacing herself to conserve energy and maintain safe walking speed. Pt instructed in standing balance/endurance activity using 1# weighted bar to hit beach ball while standing; performed for 3 reps of approximately 1 min each. Requires extended rest breaks due to fatigue. Pt instructed in seated LE exercises with 1# cuff weights on BLEs. Performed 2 sets of 10 repetitions of hip flexion and long arc quad,  and 2 sets of 5 reps sit to stand. Pt returned to room and remained seated in w/c at completion of session with all needs within reach.   Therapy Documentation Precautions:  Precautions Precautions: Fall Precaution Comments:  abdominal binder, abdominal wound, 2-3L 02 to maintain Sp02 >90% Other Brace/Splint: abdominal binder Restrictions Weight Bearing Restrictions: No Pain: Pain Assessment Pain Assessment: 0-10 Pain Score: 7  Pain Type: Surgical pain Pain Location: Abdomen Pain Orientation: Left Pain Descriptors / Indicators: Aching Pain Onset: On-going Patients Stated Pain Goal: 3 Pain Intervention(s): Other (Comment) (pre-medicated) Multiple Pain Sites: No Locomotion : Ambulation Ambulation/Gait Assistance: 4: Min guard   See FIM for current functional status  Therapy/Group: Individual Therapy  [name] 09/18/2014, 10:08 AM

## 2014-09-18 NOTE — Consult Note (Signed)
NEUROCOGNITIVE STATUS EXAMINATION - [facility]   [name] is a 59 year old, right-handed woman, who was seen for a brief neurocognitive status examination to evaluate her emotional state and mental status in the setting of ACA infarct.  According to her medical record, she was admitted on 06/17/14 with progressive shortness of breath and lower extremity edema.  Cranial CT demonstrated an acute infarct in the right anterior cerebral artery territory.  MRI demonstrated multifocal areas of acute infarct throughout both hemispheres with the largest involving the right medial parasagittal frontal and posterior frontal cortex.  She had renal failure and hemodialysis was initiated.  She has had multiple complications and was deemed appropriate for inpatient rehabilitation.  Staff members reported that her mood has been highly anxious at times, particularly surrounding dialysis and a neuropsychological consult was requested.    Emotional Functioning:  During the clinical interview, Ms. [name] reported that it has been "kinda hard" both physically and emotionally.  She specifically stated that she does not want to be a burden on anyone and it has been challenging to lose her independence.  She also said that she is quite hard on herself and holds very high expectations for what she "should" be able to do.  She mentioned that she "just wants to be better."  Ms. [name] purportedly went through a major depressive episode in the past for which she sought treatment with a psychiatrist and also underwent individual psychotherapy, which she found to be somewhat helpful.  She said that she would consider individual therapy again if she developed hopelessness, but as for now, when he depressed mood is mild, she preferred to try and get through it on her own.  She currently denied symptoms of major depression, aside from low mood, including suicidal ideation.  In addition to depressed  mood, she endorsed symptoms of anxiety, including chronic worry and panic symptoms associated with dialysis.  She said that taking anti-anxiety medication prior to dialysis has been helpful in mediating symptoms of panic and that she also uses deep breathing exercises at times to combat anxiety.  In order to combat low mood and stress in general, she said that she uses prayer.  Ms. [name] responses to self-report measures of mood symptoms were not suggestive of the presence of clinically significant depression at this time but were indicative of mild anxious mood at this time.  Time was spent in the current session validating her experience, and in processing and normalizing her emotions.    Mental Status:  Ms. [name] total score on a very brief measure of mental status was not suggestive of the presence of significant cognitive disruption at this time (MMSE-2 brief = 15/16).  She lost one point for freely recalling only 2 of 3 previously studied words after a brief delay.  Subjectively, she noted occasionally being disoriented to date, but otherwise denied noticing cognitive changes.    Impressions and Recommendations:  Ms. [name]'s score on a very brief measure of mental status was not suggestive of marked cognitive disruption at this time.  From an emotional standpoint, she seems to be having some significant anxious mood, particularly surrounding dialysis and would probably continue to benefit from antianxiety medication prior to dialysis sessions.  Her low mood does not seem to represent a recurrent clinical depressive episode and therefore, no medication to manage depression is likely necessary at this time.  However, staff members should continue to provide support during her stay and resources on individual psychotherapy providers  in her area should be included in her discharge paperwork in case she requires additional support post-discharge.  A follow-up with the neuropsychologist could also be  requested should her treatment team feel as though her mood is progressively worsening during her stay.      DIAGNOSES:   ACA infarct Unspecified Anxiety Disorder Unspecified Depressive Disorder  Leavy Cella, Psy.D.  Clinical Neuropsychologist

## 2014-09-18 NOTE — Progress Notes (Signed)
Subjective:   Making progress on rehab. Leg swelling persists, no compliants  Objective Filed Vitals:   09/17/14 2010 09/17/14 2257 09/18/14 0000 09/18/14 0532  BP: 105/63  108/72 85/56  Pulse: 88  89 89  Temp:   98.2 F (36.8 C) 97.8 F (36.6 C)  TempSrc:   Oral Oral  Resp:   18 17  Height:      Weight: 100 kg (220 lb 7.4 oz)     SpO2:  95% 99% 95%   Physical Exam General: alert and oriented, no acute distress.  Heart: RRR Lungs: CTA, unlabored Abdomen: soft, PEG tube. Dressing intact Extremities: +2LE edema bilat up to the hips Dialysis Access:  R IJ catheter/ L AVF +b  Assessment: 1. Pyloric ulcer perf s/p exlap complicated by duod fistula which has healed over 2. ESRD - new start to HD, on TTS, using R IJ catheter; AVF placed 01/28/14 by Dr. Darrick Penna, s/p revision 6/8 per Dr. Arbie Cookey, may use in 2 wks.  3. Hypotension on midodrine 4. Hx severe RHF by echo 5. Vol excess - needs more aggressive UF with HD now 6. Anemia - Hgb 9.4, Aranesp 200 mcg on Thurs, started Fe 6/9. 7. Sec HPT - Ca 9.5 corrected, P 7.8; Calcitriol 0.5 mcg- start binder 8. Nutrition - Alb 2.3, soft diet, vitamin. Still had PEG tube, not using 9. C diff colitis - now negative. 10. Anxiety - Xanax pre-HD 11. CVA  Plan - HD tomorrow, max UF. Increase UF goals , keep BP's over 85 as Caryl Pina MD pager [phone]    cell [phone] 09/18/2014, 11:47 AM    Additional Objective Labs: Basic Metabolic Panel:  Recent Labs Lab 09/12/14 1700 09/15/14 1745 09/17/14 1630  NA 125* 129* 129*  K 4.5 4.4 4.2  CL 88* 91* 93*  CO2 21* 22 24  GLUCOSE 82 88 95  BUN 51* 51* 40*  CREATININE 4.66* 5.11* 4.73*  CALCIUM 8.3* 8.1* 8.4*  PHOS 7.6* 7.8* 7.8*   Liver Function Tests:  Recent Labs Lab 09/12/14 1700 09/15/14 1745 09/17/14 1630  ALBUMIN 2.4* 2.3* 2.4*   No results for input(s): LIPASE, AMYLASE in the last 168 hours. CBC:  Recent Labs Lab 09/12/14 1700 09/15/14 1747 09/17/14 1630   WBC 10.0 9.0 6.7  HGB 9.0* 9.4* 9.6*  HCT 27.7* 29.5* 30.1*  MCV 90.2 93.7 94.4  PLT 370 353 320   Blood Culture    Component Value Date/Time   SDES ABSCESS PELVIS 08/17/2014 1451   SPECREQUEST NONE 08/17/2014 1451   CULT  08/17/2014 1451    NO GROWTH 3 DAYS Performed at [facility]    REPTSTATUS 08/21/2014 FINAL 08/17/2014 1451    Cardiac Enzymes: No results for input(s): CKTOTAL, CKMB, CKMBINDEX, TROPONINI in the last 168 hours. CBG:  Recent Labs Lab 09/14/14 1958 09/15/14 0002 09/15/14 0354 09/15/14 1146 09/15/14 1206  GLUCAP 101* 79 97 65 86   Iron Studies: No results for input(s): IRON, TIBC, TRANSFERRIN, FERRITIN in the last 72 hours. @ Studies/Results: Dg Chest 2 View  09/16/2014   CLINICAL DATA:  Shortness of breath, weakness  EXAM: CHEST  2 VIEW  COMPARISON:  Prior chest x-ray 09/01/2014  FINDINGS: Interval change in the configuration of the left IJ approach tunneled PICC. The catheter tip has flipped up into the right brachiocephalic vein. Tunneled right IJ central venous catheter remains in unchanged position with the tips in the SVC. Stable cardiomegaly. No focal airspace consolidation, pleural effusion, pulmonary edema  or pneumothorax. Mild vascular congestion without interval change. No acute osseous abnormality.  IMPRESSION: 1. The tip of the left IJ approach tunneled PICC has flipped up into the right brachiocephalic vein. 2. The tip of the right IJ approach tunneled hemodialysis catheter remains unchanged overlying the SVC. 3. Stable cardiomegaly without evidence of acute cardiopulmonary process.   Electronically Signed   By: Malachy Moan M.D.   On: 09/16/2014 15:01   Medications:   . ALPRAZolam  0.5 mg Oral Q T,Th,Sa-HD  . amiodarone  400 mg Oral BID  . aspirin  81 mg Oral Daily  . budesonide (PULMICORT) nebulizer solution  0.25 mg Nebulization BID  . calcitRIOL  0.5 mcg Oral Q T,Th,Sat-1800  . chlorhexidine  15 mL Mouth Rinse  BID  . citalopram  20 mg Oral Daily  . darbepoetin (ARANESP) injection - DIALYSIS  200 mcg Intravenous Q Thu-HD  . feeding supplement (NEPRO CARB STEADY)  237 mL Oral Q1500  . feeding supplement (PRO-STAT SUGAR FREE 64)  30 mL Per Tube TID BM  . feeding supplement (RESOURCE BREEZE)  1 Container Oral BID BM  . feeding supplement (VITAL AF 1.2 CAL)  1,000 mL Per Tube Q24H  . ferric gluconate (FERRLECIT/NULECIT) IV  125 mg Intravenous Q T,Th,Sa-HD  . ipratropium  0.5 mg Nebulization TID  . levalbuterol  0.63 mg Nebulization TID  . levothyroxine  25 mcg Oral QAC breakfast  . multivitamin  1 tablet Oral QHS  . sevelamer carbonate  2.4 g Oral TID  . vitamin C  250 mg Oral Daily

## 2014-09-19 ENCOUNTER — Inpatient Hospital Stay (HOSPITAL_COMMUNITY): Payer: Self-pay | Admitting: Occupational Therapy

## 2014-09-19 LAB — RENAL FUNCTION PANEL
Albumin: 2.3 g/dL — ABNORMAL LOW (ref 3.5–5.0)
Anion gap: 13 (ref 5–15)
BUN: 29 mg/dL — ABNORMAL HIGH (ref 6–20)
CALCIUM: 8 mg/dL — AB (ref 8.9–10.3)
CO2: 23 mmol/L (ref 22–32)
Chloride: 93 mmol/L — ABNORMAL LOW (ref 101–111)
Creatinine, Ser: 4.92 mg/dL — ABNORMAL HIGH (ref 0.44–1.00)
GFR, EST AFRICAN AMERICAN: 10 mL/min — AB (ref >60)
GFR, EST NON AFRICAN AMERICAN: 9 mL/min — AB (ref >60)
Glucose, Bld: 97 mg/dL (ref 65–99)
PHOSPHORUS: 7.3 mg/dL — AB (ref 2.5–4.6)
Potassium: 3.5 mmol/L (ref 3.5–5.1)
Sodium: 129 mmol/L — ABNORMAL LOW (ref 135–145)

## 2014-09-19 LAB — CBC
HEMATOCRIT: 30.4 % — AB (ref 36.0–46.0)
HEMOGLOBIN: 9.6 g/dL — AB (ref 12.0–15.0)
MCH: 29.4 pg (ref 26.0–34.0)
MCHC: 31.6 g/dL (ref 30.0–36.0)
MCV: 93.3 fL (ref 78.0–100.0)
Platelets: 275 10*3/uL (ref 150–400)
RBC: 3.26 MIL/uL — ABNORMAL LOW (ref 3.87–5.11)
RDW: 19.5 % — AB (ref 11.5–15.5)
WBC: 6.6 10*3/uL (ref 4.0–10.5)

## 2014-09-19 MED ORDER — ALTEPLASE 2 MG IJ SOLR
2.0000 mg | Freq: Once | INTRAMUSCULAR | Status: DC | PRN
  Filled 2014-09-19: qty 2

## 2014-09-19 MED ORDER — LIDOCAINE-PRILOCAINE 2.5-2.5 % EX CREA
1.0000 "application " | TOPICAL_CREAM | CUTANEOUS | Status: DC | PRN
  Filled 2014-09-19: qty 5

## 2014-09-19 MED ORDER — ONDANSETRON HCL 4 MG/2ML IJ SOLN
4.0000 mg | Freq: Once | INTRAMUSCULAR | Status: AC
  Administered 2014-09-19: 4 mg via INTRAVENOUS

## 2014-09-19 MED ORDER — SODIUM CHLORIDE 0.9 % IV SOLN: 100.0000 mL | INTRAVENOUS | Status: DC | PRN

## 2014-09-19 MED ORDER — ONDANSETRON HCL 4 MG/2ML IJ SOLN
INTRAMUSCULAR | Status: AC
  Filled 2014-09-19: qty 2

## 2014-09-19 MED ORDER — ALPRAZOLAM 0.5 MG PO TABS
ORAL_TABLET | ORAL | Status: AC
  Filled 2014-09-19: qty 1

## 2014-09-19 MED ORDER — LIDOCAINE HCL (PF) 1 % IJ SOLN: 5.0000 mL | INTRAMUSCULAR | Status: DC | PRN

## 2014-09-19 MED ORDER — PENTAFLUOROPROP-TETRAFLUOROETH EX AERO: 1.0000 "application " | INHALATION_SPRAY | CUTANEOUS | Status: DC | PRN

## 2014-09-19 MED ORDER — NEPRO/CARBSTEADY PO LIQD
237.0000 mL | ORAL | Status: DC | PRN
  Filled 2014-09-19: qty 237

## 2014-09-19 NOTE — Progress Notes (Signed)
Occupational Therapy Session Note  Patient Details  Name: [name] MRN: [id number] Date of Birth: 02-20-1956  Today's Date: 09/19/2014 OT Individual Time: 1341-1411 OT Individual Time Calculation (min): 30 min    Short Term Goals: Week 2:  OT Short Term Goal 1 (Week 2): Pt will perform LB dressing with Mod A in order to decrease level of assistance for self care. OT Short Term Goal 2 (Week 2): Pt will complete toilet transfer with RW at supervision level OT Short Term Goal 3 (Week 2): Pt will complete bathing at sink with supervision at sit > stand level OT Short Term Goal 4 (Week 2): Pt will complete 2/3 toileting tasks with supervision  Skilled Therapeutic Interventions/Progress Updates:  Upon entering the room, pt reporting having multiple bouts of diarrhea this afternoon and not feeling well. Pt seated in wheelchair with no other c/o pain. Pt performed STS from wheelchair with Mod A secondary to decreased anterior weight shift. Pt ambulated with min A 15' into bathroom with use of RW. Pt seated on elevated toilet with c/o fatigue from short distance. Pt did not need to toilet at this time. Pt ambulating to sink to wash hands and requiring min A for balance and min verbal cues to not lean on sink for balance. Pt returning to wheelchair with use of RW. Pt reporting not remembering B UE exercises performed in prior sessions with use of theraband. OT demonstrated use of level 2 therband for alternating punches and chest pulls. Pt returning demonstrations with min cues for proper technique. Pt seated in wheelchair with call bell and all needed items within reach upon exiting the room.   Therapy Documentation Precautions:  Precautions Precautions: Fall Precaution Comments:  abdominal binder, abdominal wound, 2-3L 02 to maintain Sp02 >90% Other Brace/Splint: abdominal binder Restrictions Weight Bearing Restrictions: No Vital Signs: Therapy Vitals Temp: 97.9 F (36.6 C) Temp Source:  Oral Pulse Rate: 86 Resp: 18 BP: 131/84 mmHg Oxygen Therapy SpO2: 96 % O2 Device: Not Delivered  See FIM for current functional status  Therapy/Group: Individual Therapy  Lowella Grip 09/19/2014, 4:05 PM

## 2014-09-19 NOTE — Progress Notes (Signed)
[name], female   DOB: 02/15/1956, 59 y.o.   MRN: [id number]    [place] [facility]     PROGRESS NOTE   09/19/14.  Subjective/Complaints:  59 y/o admit for CIR with functional deficits secondary to right ACA infarct status post loop recorder/respiratory failure.   LLE Dopplers - on 5/3 and 6/3 Recently placed fistula left arm, some pain when using Left arm  Still has GJ tube Also has diatek cath R chest wall ROS: Pt denies  nausea, vomiting, abdominal pain, diarrhea, chest pain,, anxiety, or depression  Past Medical History  Diagnosis Date  . Asthma   . Hypertension   . Gout   . Arthritis   . Insomnia   . Chronic kidney disease   . Depression   . Renal insufficiency   . Aortic aneurysm without rupture     3cm by CT 08/03/14  . Paroxysmal SVT (supraventricular tachycardia)   . Cor pulmonale   . Stroke 06/2014  . PFO (patent foramen ovale)   . COPD (chronic obstructive pulmonary disease)     Intake/Output Summary (Last 24 hours) at 09/19/14 1018 Last data filed at 09/18/14 1300  Gross per 24 hour  Intake    240 ml  Output      0 ml  Net    240 ml    Patient Vitals for the past 24 hrs:  BP Temp Temp src Pulse Resp SpO2 Weight  09/19/14 0805 - - - - - 96 % -  09/19/14 0535 109/79 mmHg 97.8 F (36.6 C) Oral 89 18 93 % 218 lb (98.884 kg)  09/18/14 2031 - - - - - 99 % -  09/18/14 1329 119/70 mmHg 98 F (36.7 C) Oral - 18 99 % -      Objective: Vital Signs: Blood pressure 109/79, pulse 89, temperature 97.8 F (36.6 C), temperature source Oral, resp. rate 18, height  (1.702 m), weight 218 lb (98.884 kg), SpO2 96 %. No results found.  Recent Labs  09/17/14 1630  WBC 6.7  HGB 9.6*  HCT 30.1*  PLT 320    Recent Labs  09/17/14 1630  NA 129*  K 4.2  CL 93*  GLUCOSE 95  BUN 40*  CREATININE 4.73*  CALCIUM 8.4*   CBG (last 3)  No results for input(s): GLUCAP in the last 72 hours.  Wt Readings from  Last 3 Encounters:  09/19/14 218 lb (98.884 kg)  09/10/14 218 lb 3.2 oz (98.975 kg)  06/01/14 225 lb (102.059 kg)    Physical Exam:  Gen: alert, no distress, obese.  HHN treatment in progress HENT:  Poor dentition,  Eyes: EOM are normal.  Neck: Normal range of motion. Neck supple. No thyromegaly present.  Cardiovascular: Normal rate and regular rhythm. no murmurs Respiratory: Effort normal and breath sounds normal. No respiratory distress. occ rhonchi and upper airway sounds.  GI: Bowel sounds are normal.  Abdominal wound is dressed . G-tube site clean and dry , no drainage Skin: left AVG site clean, less tender, minimal edema, 2+ edema RLE, trace edema RLE Neurological: She is alert.  She is oriented to person place date of birth.  LUE 4/5 deltoid, bicep, tricep. 4/5 wrist/hand. Psych: pleasant     Medical Problem List and Plan: 1. 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 status post loop recorder/respiratory failure 2. DVT Prophylaxis/Anticoagulation: SCD.  Venous Doppler studies negative, can use TEDs, no need for repeat Doppler 3. Pain Management:  Oxycodone as needed. Fair control at present 4. End-stage renal disease. Left arm AV fistula 09/09/2014. Hemodialysis as per renal services  5. Fluids/Electrolytes/Nutrition: . Check labs Monday  -continuous TF on board overnight--will cut in half to help increase appetite 6. Hx of C diff: placed on flagyl for one week given use of abx during this hospitalization 7. Hypertension/intermittent bouts of PSVT. Lopressor 12.5 mg twice a day--rate controlled. 8. Massive pneumoperitoneum and ascites. Status post exploratory laparotomy and closure of perforated pyloric ulcer 07/01/2014.  -maintain dressing changes per Gen surg. 9. Decreased nutritional storage. Advance diet as tolerated. Status post GJ tube 08/12/2014 per interventional radiology  LOS (Days) 8 A FACE TO FACE EVALUATION WAS PERFORMED  Rogelia Boga 09/19/2014 10:14 AM

## 2014-09-20 ENCOUNTER — Inpatient Hospital Stay (HOSPITAL_COMMUNITY): Payer: Self-pay | Admitting: Physical Therapy

## 2014-09-20 MED ORDER — BISACODYL 5 MG PO TBEC: 5.0000 mg | DELAYED_RELEASE_TABLET | Freq: Every day | ORAL | Status: DC | PRN

## 2014-09-20 MED ORDER — ALUM & MAG HYDROXIDE-SIMETH 200-200-20 MG/5ML PO SUSP
30.0000 mL | Freq: Four times a day (QID) | ORAL | Status: DC | PRN
  Administered 2014-09-20 – 2014-09-27 (×3): 30 mL via ORAL
  Filled 2014-09-20 (×3): qty 30

## 2014-09-20 NOTE — Progress Notes (Signed)
Pt back from dialysis around 2250, complaints of nausea, bloating and abdominal pain. Zofran was given in HD per report. Pt had BM x1 on commode and staff assisted back to bed. O2 sats 88-89% on RA, applied O2 at 2l/min with increases sats to 92-94%. Pt continued to complain of no relief of the symptoms, at routine rounding. MD notifies ans orders received. Maalox given at 0208. Pt fell asleep shortly after and upon next rounding, pt still reports "not feeling good." VS taken and stable. Will communicate with MD and continue to monitor.  Jaeda Bruso, Phill Mutter

## 2014-09-20 NOTE — Progress Notes (Signed)
Physical Therapy Session Note  Patient Details  Name: [name] MRN: [id number] Date of Birth: [birth year]  Today's Date: 09/18/14 PT Individual Time: 1300-1325     Short Term Goals: Week 1:  PT Short Term Goal 1 (Week 1): Pt will perform sit<>stand with UE support at min A level  PT Short Term Goal 2 (Week 1): Pt will perform stand pivot transfers with LRAD at min A level  PT Short Term Goal 3 (Week 1): Pt will tolerate dynamic standing task for 2 mins with SaO2 remaining in the 90's.  PT Short Term Goal 4 (Week 1): Pt will ambulate x 17' w/ LRAD at S min/guard level with SaO2 in the 90's.    Skilled Therapeutic Interventions/Progress Updates:    Pt received seated in w/c with c/o abdominal pain as described below. Pt instructed in w/c propulsion for 2 trials for approximately 197ft using BUEs and minA due to slow speed. Pt requires several rest breaks due to UE fatigue and shortness of breath. Pt instructed in ascent/descent of 5 6-inch stairs x2 trials with rest break between, using R handrail. On initial trial pt performed descent using BUEs on L handrail and sideways stepping leading with RLE. On second trial pt instructed in descent using R HHA and L hand on handrail to encourage normalized gait pattern. Required cues for leading with correct LE for ascent/descent. Following second trial pt became very anxious and required cues for pursed lip breathing to slow respiratory rate. Pt returned to room and remained seated in w/c with all needs within reach.   Therapy Documentation Precautions:  Precautions Precautions: Fall Precaution Comments:  abdominal binder, abdominal wound, 2-3L 02 to maintain Sp02 >90% Other Brace/Splint: abdominal binder Restrictions Weight Bearing Restrictions: No Vital Signs: Oxygen Therapy SpO2: 96 % O2 Device: Nasal Cannula O2 Flow Rate (L/min): 2 L/min Pain: Pain Assessment Pain Score: 0-No pain Faces Pain Scale: No hurt PAINAD (Pain Assessment in  Advanced Dementia) Breathing: normal   See FIM for current functional status  Therapy/Group: Individual Therapy  [name] 09/20/2014, 10:46 AM

## 2014-09-20 NOTE — Progress Notes (Signed)
Patient ID: Shanetta C Gosnell, female   DOB: June 02, 1955, 59 y.o.   MRN: [id number]  [place] [facility]     PROGRESS NOTE   09/20/14.  Subjective/Complaints:  59 y/o admit for CIR with functional deficits secondary to right ACA infarct status post loop recorder/respiratory failure. LLE Dopplers - on 5/3 and 6/3 Recently placed fistula left arm, some pain when using Left arm  Still has GJ tube Also has diatek cath R chest wall  ROS: Episode of nausea and vomiting while at HD yesterday; improved but still with slight nausea this am.  On renal diet with enteral supplements  Past Medical History  Diagnosis Date  . Asthma   . Hypertension   . Gout   . Arthritis   . Insomnia   . Chronic kidney disease   . Depression   . Renal insufficiency   . Aortic aneurysm without rupture     3cm by CT 08/03/14  . Paroxysmal SVT (supraventricular tachycardia)   . Cor pulmonale   . Stroke 06/2014  . PFO (patent foramen ovale)   . COPD (chronic obstructive pulmonary disease)     Intake/Output Summary (Last 24 hours) at 09/20/14 0904 Last data filed at 09/19/14 2220  Gross per 24 hour  Intake    120 ml  Output   2221 ml  Net  -2101 ml    Patient Vitals for the past 24 hrs:  BP Temp Temp src Pulse Resp SpO2 Weight  09/20/14 0845 - - - - - 96 % -  09/20/14 0624 106/73 mmHg 98 F (36.7 C) Oral 89 18 99 % 216 lb (97.977 kg)  09/19/14 2300 95/60 mmHg 97.5 F (36.4 C) Oral 88 18 94 % -  09/19/14 2220 100/64 mmHg 98 F (36.7 C) Oral 91 18 95 % -  09/19/14 2200 (!) 82/58 mmHg - - 89 - - -  09/19/14 2130 101/65 mmHg - - 89 - - -  09/19/14 2100 127/82 mmHg - - 90 - - -  09/19/14 2030 133/82 mmHg - - 88 - - -  09/19/14 1925 125/83 mmHg - - 88 - - -  09/19/14 1920 125/80 mmHg - - 88 - - -  09/19/14 1900 106/75 mmHg 97.5 F (36.4 C) Oral 87 18 92 % -  09/19/14 1445 - - - - - 96 % -  09/19/14 1400 131/84 mmHg 97.9 F (36.6 C) Oral 86 18 94 % -       Objective: Vital Signs: Blood pressure 106/73, pulse 89, temperature 98 F (36.7 C), temperature source Oral, resp. rate 18, height  (1.702 m), weight 216 lb (97.977 kg), SpO2 96 %. No results found.  Recent Labs  09/17/14 1630 09/19/14 1954  WBC 6.7 6.6  HGB 9.6* 9.6*  HCT 30.1* 30.4*  PLT 320 275    Recent Labs  09/17/14 1630 09/19/14 1954  NA 129* 129*  K 4.2 3.5  CL 93* 93*  GLUCOSE 95 97  BUN 40* 29*  CREATININE 4.73* 4.92*  CALCIUM 8.4* 8.0*   CBG (last 3)  No results for input(s): GLUCAP in the last 72 hours.  Wt Readings from Last 3 Encounters:  09/20/14 216 lb (97.977 kg)  09/10/14 218 lb 3.2 oz (98.975 kg)  06/01/14 225 lb (102.059 kg)    Physical Exam:  Gen: alert, no distress, obese.  HHN treatment in progress HENT:  N/C O2 in place Poor dentition,  Eyes: EOM are normal.  Neck: Normal  range of motion. Neck supple. No thyromegaly present.  Cardiovascular: Normal rate and regular rhythm. no murmurs Respiratory: Effort normal and breath sounds normal. No respiratory distress. occ rhonchi and upper airway sounds.  GI: Bowel sounds are normal.  Abdominal wound is dressed . G-tube site clean and dry , no drainage.  Minimal epigastric tenderness; soft, flat and non distended.  Active BS Skin: left AVG site clean, less tender, minimal edema, 2+ edema RLE, trace edema RLE Neurological: She is alert.  She is oriented to person place date of birth.  LUE 4/5 deltoid, bicep, tricep. 4/5 wrist/hand. Psych: pleasant     Medical Problem List and Plan: 1. 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 status post loop recorder/respiratory failure 2. DVT Prophylaxis/Anticoagulation: SCD.  Venous Doppler studies negative, can use TEDs, no need for repeat Doppler 3. Pain Management: Oxycodone as needed. Fair control at present 4. End-stage renal disease. Left arm AV fistula 09/09/2014. Hemodialysis as per renal services  5.  Fluids/Electrolytes/Nutrition: . Check labs Monday  -continuous TF on board overnight--will cut in half to help increase appetite 6. Hx of C diff: placed on flagyl for one week given use of abx during this hospitalization 7. Hypertension/intermittent bouts of PSVT. Lopressor 12.5 mg twice a day--rate controlled. 8. Massive pneumoperitoneum and ascites. Status post exploratory laparotomy and closure of perforated pyloric ulcer 07/01/2014.  -maintain dressing changes per Gen surg. 9. Decreased nutritional storage. Advance diet as tolerated. Status post GJ tube 08/12/2014 per interventional radiology 10.  Nausea-  Benign exam-will continue zofran and observe.  LOS (Days) 9 A FACE TO FACE EVALUATION WAS PERFORMED  Rogelia Boga 09/20/2014 9:04 AM

## 2014-09-20 NOTE — Progress Notes (Signed)
Physical Therapy Session Note  Patient Details  Name: [name] MRN: [id number] Date of Birth: 01-11-1956  Today's Date: 09/20/2014 PT Individual Time: 1300-1345 PT Individual Time Calculation (min): 45 min   Short Term Goals: Week 1:  PT Short Term Goal 1 (Week 1): Pt will perform sit<>stand with UE support at min A level  PT Short Term Goal 2 (Week 1): Pt will perform stand pivot transfers with LRAD at min A level  PT Short Term Goal 3 (Week 1): Pt will tolerate dynamic standing task for 2 mins with SaO2 remaining in the 90's.  PT Short Term Goal 4 (Week 1): Pt will ambulate x 48' w/ LRAD at S min/guard level with SaO2 in the 90's.    Skilled Therapeutic Interventions/Progress Updates:   Pt defers further OOB activities in session post commode transfers. Pt tolerates limited supine therex in session citing pain. RN aware of limited functional status compared to previous sessions. Pt would continue to benefit from skilled PT services to increase functional mobility.  Therapy Documentation Precautions:  Precautions Precautions: Fall Precaution Comments:  abdominal binder, abdominal wound, 2-3L 02 to maintain Sp02 >90% Other Brace/Splint: abdominal binder Restrictions Weight Bearing Restrictions: No Pain: Pain Assessment Pain Assessment: 0-10 Pain Score: 9  Faces Pain Scale: No hurt Pain Location: Abdomen Pain Orientation: Left FACES Breathing: normal Mobility:  Mod A with cues for weight shift and technique Other Treatments:  Pt educated on rehab plan, safety in mobility, benefits of OOB, progressing activity, and pain science. Pt performs transfer x2 in session. Pt performs ankle pumps, heel slides AAROM, hip abd AAROM, glute sets, quad sets, hip ER/IR AROM 2x10. Pt performs dynamic sitting and standing balance activities with dual motor tasks including weight shifting, reaching, and grasping within functional context.  See FIM for current functional  status  Therapy/Group: Individual Therapy  Christia Reading 09/20/2014, 1:26 PM

## 2014-09-21 ENCOUNTER — Inpatient Hospital Stay (HOSPITAL_COMMUNITY): Payer: Self-pay | Admitting: Occupational Therapy

## 2014-09-21 ENCOUNTER — Inpatient Hospital Stay (HOSPITAL_COMMUNITY): Payer: Medicaid Other

## 2014-09-21 ENCOUNTER — Inpatient Hospital Stay (HOSPITAL_COMMUNITY): Payer: Self-pay | Admitting: Physical Therapy

## 2014-09-21 DIAGNOSIS — R1010 Upper abdominal pain, unspecified: Secondary | ICD-10-CM

## 2014-09-21 LAB — CBC WITH DIFFERENTIAL/PLATELET
Basophils Absolute: 0 10*3/uL (ref 0.0–0.1)
Basophils Relative: 0 % (ref 0–1)
EOS ABS: 0.2 10*3/uL (ref 0.0–0.7)
Eosinophils Relative: 1 % (ref 0–5)
HEMATOCRIT: 32.5 % — AB (ref 36.0–46.0)
Hemoglobin: 10.1 g/dL — ABNORMAL LOW (ref 12.0–15.0)
Lymphocytes Relative: 8 % — ABNORMAL LOW (ref 12–46)
Lymphs Abs: 1.3 10*3/uL (ref 0.7–4.0)
MCH: 30.1 pg (ref 26.0–34.0)
MCHC: 31.1 g/dL (ref 30.0–36.0)
MCV: 96.7 fL (ref 78.0–100.0)
MONO ABS: 1.8 10*3/uL — AB (ref 0.1–1.0)
Monocytes Relative: 11 % (ref 3–12)
NEUTROS ABS: 12.9 10*3/uL — AB (ref 1.7–7.7)
NEUTROS PCT: 80 % — AB (ref 43–77)
Platelets: 237 10*3/uL (ref 150–400)
RBC: 3.36 MIL/uL — ABNORMAL LOW (ref 3.87–5.11)
RDW: 19.5 % — ABNORMAL HIGH (ref 11.5–15.5)
WBC: 16.2 10*3/uL — ABNORMAL HIGH (ref 4.0–10.5)

## 2014-09-21 MED ORDER — MIDODRINE HCL 5 MG PO TABS
10.0000 mg | ORAL_TABLET | Freq: Every day | ORAL | Status: DC
  Administered 2014-09-21 – 2014-09-26 (×6): 10 mg via ORAL
  Filled 2014-09-21 (×7): qty 2

## 2014-09-21 MED ORDER — SODIUM CHLORIDE 0.9 % IV SOLN
INTRAVENOUS | Status: DC
  Administered 2014-09-21 – 2014-09-23 (×4): via INTRAVENOUS

## 2014-09-21 NOTE — Progress Notes (Signed)
Subjective:   Doesn't feel good today- increased abdominal pain and bloating- xray is negative- says it all started with HD on Saturday- BP did drop  Objective Filed Vitals:   09/21/14 0303 09/21/14 0529 09/21/14 0826 09/21/14 0828  BP:  102/68    Pulse:  88    Temp:  98.9 F (37.2 C)    TempSrc:  Oral    Resp:  18    Height:      Weight:  98.431 kg (217 lb)    SpO2: 96% 95% 96% 95%   Physical Exam General: alert and oriented, no acute distress.  Heart: RRR Lungs: CTA, unlabored Abdomen: soft, PEG tube. Dressing intact Extremities: +2LE edema bilat up to the hips Dialysis Access:  R IJ catheter/ L AVF +b  Assessment: 1. Pyloric ulcer perf s/p exlap complicated by duod fistula which has healed over- now with worsening sxms but xray seems OK 2. ESRD - new start to HD, on TTS, using R IJ catheter; AVF placed 01/28/14 by Dr. Darrick Penna, s/p revision 6/8 per Dr. Arbie Cookey, may use in 2 wks. Plan for next HD tomorrow 6/21- follow up on OP HD placement 3. Hypotension on midodrine- actually was not- I have added back 4. Hx severe RHF by echo 5. Vol excess - needs more aggressive UF with HD now- should help with sodium- but need to take care so not to drop pressure too much  6. Anemia - Hgb 10.1, Aranesp 200 mcg on Thurs, started Fe 6/9. 7. Sec HPT - Ca 9.5 corrected, P 7.3; Calcitriol 0.5 mcg- on renvela 8. Nutrition - Alb 2.3, soft diet, vitamin. Still had PEG tube, not using 9. C diff colitis - now negative. 10. Anxiety - Xanax pre-HD 11. CVA  [name] A   09/21/2014, 10:15 AM    Additional Objective Labs: Basic Metabolic Panel:  Recent Labs Lab 09/15/14 1745 09/17/14 1630 09/19/14 1954  NA 129* 129* 129*  K 4.4 4.2 3.5  CL 91* 93* 93*  CO2 22 24 23   GLUCOSE 88 95 97  BUN 51* 40* 29*  CREATININE 5.11* 4.73* 4.92*  CALCIUM 8.1* 8.4* 8.0*  PHOS 7.8* 7.8* 7.3*   Liver Function Tests:  Recent Labs Lab 09/15/14 1745 09/17/14 1630 09/19/14 1954  ALBUMIN 2.3*  2.4* 2.3*   No results for input(s): LIPASE, AMYLASE in the last 168 hours. CBC:  Recent Labs Lab 09/15/14 1747 09/17/14 1630 09/19/14 1954 09/21/14 0641  WBC 9.0 6.7 6.6 16.2*  NEUTROABS  --   --   --  12.9*  HGB 9.4* 9.6* 9.6* 10.1*  HCT 29.5* 30.1* 30.4* 32.5*  MCV 93.7 94.4 93.3 96.7  PLT 353 320 275 237   Blood Culture    Component Value Date/Time   SDES ABSCESS PELVIS 08/17/2014 1451   SPECREQUEST NONE 08/17/2014 1451   CULT  08/17/2014 1451    NO GROWTH 3 DAYS Performed at [facility]    REPTSTATUS 08/21/2014 FINAL 08/17/2014 1451    Cardiac Enzymes: No results for input(s): CKTOTAL, CKMB, CKMBINDEX, TROPONINI in the last 168 hours. CBG:  Recent Labs Lab 09/14/14 1958 09/15/14 0002 09/15/14 0354 09/15/14 1146 09/15/14 1206  GLUCAP 101* 79 97 65 86   Iron Studies: No results for input(s): IRON, TIBC, TRANSFERRIN, FERRITIN in the last 72 hours. @lablastinr3 @ Studies/Results: Dg Abd Portable 1v  09/21/2014   CLINICAL DATA:  Abdominal pain  EXAM: PORTABLE ABDOMEN - 1 VIEW  COMPARISON:  None.  FINDINGS: There is a normal small  bowel gas pattern. A gastric feeding tube is noted. Some colonic gas noted in transverse colon and rectum. Degenerative changes lumbar spine.  IMPRESSION: Normal small bowel gas pattern. Percutaneous gastric feeding tube. Degenerative changes lumbar spine.   Electronically Signed   By: Natasha Mead M.D.   On: 09/21/2014 09:17   Medications: . sodium chloride     . ALPRAZolam  0.5 mg Oral Q T,Th,Sa-HD  . amiodarone  400 mg Oral BID  . aspirin  81 mg Oral Daily  . budesonide (PULMICORT) nebulizer solution  0.25 mg Nebulization BID  . calcitRIOL  0.5 mcg Oral Q T,Th,Sat-1800  . chlorhexidine  15 mL Mouth Rinse BID  . citalopram  20 mg Oral Daily  . darbepoetin (ARANESP) injection - DIALYSIS  200 mcg Intravenous Q Thu-HD  . feeding supplement (NEPRO CARB STEADY)  237 mL Oral Q1500  . feeding supplement (PRO-STAT SUGAR FREE 64)   30 mL Per Tube TID BM  . feeding supplement (RESOURCE BREEZE)  1 Container Oral BID BM  . feeding supplement (VITAL AF 1.2 CAL)  1,000 mL Per Tube Q24H  . ipratropium  0.5 mg Nebulization TID  . levalbuterol  0.63 mg Nebulization TID  . levothyroxine  25 mcg Oral QAC breakfast  . multivitamin  1 tablet Oral QHS  . sevelamer carbonate  2.4 g Oral TID  . vitamin C  250 mg Oral Daily

## 2014-09-21 NOTE — Progress Notes (Signed)
Continues to complain of RUQ and epigastric pain. "I feel swollen." Felt nauseated at HS with emesis. Large, continent loose stool on bedpan at 0030. At 0300, O2 off, Spot checked O2, sat=85% RA. O2 applied at 2L/M via [place]. BP at that time-86/61, temp-98.3 (O), HR-92.  Scheduled TF turned off at 0230, per orders. Poor intake. "I just don't feel good." Paged Dr. Amador Cunas with above info. Orders to make NPO, NS at 65cc/hr, and CBC w/diff this AM. Will continue to monitor.Alfredo Martinez A

## 2014-09-21 NOTE — Progress Notes (Signed)
09/21/2014 2:00 PM Hemodialysis Outpatient Note; This patient has been accepted at the [facility] center on a Tuesday, Thursday and Saturday 1st shift schedule. The center can begin treatment on Thursday Jeanee 30 at 6:45 AM. The patient may need to stop at the center on the day prior to her initial start just to sign paperwork and consents. I have a call out to the center to verify when they need to see the patient. Thank you.Tilman Neat

## 2014-09-21 NOTE — Progress Notes (Signed)
[facility] Progress Note     Subjective: Pt states she developed nausea, anorexia, and abdominal pain on Saturday night during dialysis.  She says she usually gets anxious and nurses report she gets hypotensive during HD.  She says since then she hasn't wanted to eat anything and feels quite nauseated.  She describes a soreness in her RLQ.  Thirsty and wants liquids however.  HAsn't wanted to participate with therapy due to feeling bad.  Having BM's and flatus.  Was getting nightly TF through G-tube, but now on hold.    Objective: Vital signs in last 24 hours: Temp:  [98.3 F (36.8 C)-98.9 F (37.2 C)] 98.9 F (37.2 C) (06/20 0529) Pulse Rate:  [87-92] 88 (06/20 0529) Resp:  [18-22] 18 (06/20 0529) BP: (86-102)/(57-68) 102/68 mmHg (06/20 0529) SpO2:  [85 %-100 %] 95 % (06/20 0828) Weight:  [98.431 kg (217 lb)] 98.431 kg (217 lb) (06/20 0529) Last BM Date: 09/20/14  Intake/Output from previous day: 06/19 0701 - 06/20 0700 In: 390 [P.O.:360; NG/GT:30] Out: -  Intake/Output this shift: Total I/O In: 10 [I.V.:10] Out: -   PE: General: pleasant, WD/WN AA female who is laying in bed in NAD HEENT: head is normocephalic, atraumatic.  Sclera are noninjected.  PERRL.  Ears and nose without any masses or lesions.  Mouth is pink and moist Heart: regular, rate, and rhythm.  Normal s1,s2. No obvious murmurs, gallops, or rubs noted.  Palpable radial and pedal pulses bilaterally Lungs: CTAB, no wheezes, rhonchi, or rales noted.  Respiratory effort nonlabored Abd: soft, ND, mildly tender in RLQ, no rebound or guarding, +BS, no masses, hernias, or organomegaly, midline wound with good granulation tissue, very clean without significant slough, no drainage. MS: all 4 extremities are symmetrical with no cyanosis, clubbing, or edema. Skin: warm and dry with no masses, lesions, or rashes Psych: A&Ox3 with an appropriate affect, but a bit frustrated/concerned today   Lab Results:    Recent Labs  09/19/14 1954 09/21/14 0641  WBC 6.6 16.2*  HGB 9.6* 10.1*  HCT 30.4* 32.5*  PLT 275 237   BMET  Recent Labs  09/19/14 1954  NA 129*  K 3.5  CL 93*  CO2 23  GLUCOSE 97  BUN 29*  CREATININE 4.92*  CALCIUM 8.0*   PT/INR No results for input(s): LABPROT, INR in the last 72 hours. CMP     Component Value Date/Time   NA 129* 09/19/2014 1954   NA 139 10/02/2012 1046   K 3.5 09/19/2014 1954   K 4.4 10/02/2012 1046   CL 93* 09/19/2014 1954   CO2 23 09/19/2014 1954   CO2 25 10/02/2012 1046   GLUCOSE 97 09/19/2014 1954   GLUCOSE 110 10/02/2012 1046   BUN 29* 09/19/2014 1954   BUN 35.2* 10/02/2012 1046   CREATININE 4.92* 09/19/2014 1954   CREATININE 2.5* 10/02/2012 1046   CALCIUM 8.0* 09/19/2014 1954   CALCIUM 9.8 10/02/2012 1046   PROT 6.8 09/10/2014 0741   PROT 8.4* 10/02/2012 1046   ALBUMIN 2.3* 09/19/2014 1954   ALBUMIN 3.1* 10/02/2012 1046   AST 16 09/10/2014 0741   AST 9 10/02/2012 1046   ALT 9* 09/10/2014 0741   ALT <6 Repeated and Verified 10/02/2012 1046   ALKPHOS 130* 09/10/2014 0741   ALKPHOS 108 10/02/2012 1046   BILITOT 0.8 09/10/2014 0741   BILITOT 0.21 10/02/2012 1046   GFRNONAA 9* 09/19/2014 1954   GFRAA 10* 09/19/2014 1954   Lipase  No results found for: LIPASE  Studies/Results: Dg Abd Portable 1v  09/21/2014   CLINICAL DATA:  Abdominal pain  EXAM: PORTABLE ABDOMEN - 1 VIEW  COMPARISON:  None.  FINDINGS: There is a normal small bowel gas pattern. A gastric feeding tube is noted. Some colonic gas noted in transverse colon and rectum. Degenerative changes lumbar spine.  IMPRESSION: Normal small bowel gas pattern. Percutaneous gastric feeding tube. Degenerative changes lumbar spine.   Electronically Signed   By: Natasha Mead M.D.   On: 09/21/2014 09:17    Anti-infectives: Anti-infectives    Start     Dose/Rate Route Frequency Ordered Stop   09/11/14 2300  metroNIDAZOLE (FLAGYL) tablet 500 mg  Status:  Discontinued     500  mg Oral 3 times per day 09/11/14 1650 09/14/14 1514       Assessment/Plan S/p Exploratory Laparotomy with graham patch closure of perforated pyloric ulcer, SBR for ischemic bowel - 07/01/2014 - Derrell Lolling  -All drains have been removed -Continue BID wet to dry dressing changes, wound is very clean and healing well -Concern with worsening nausea, anorexia, and abdominal pain.  Will monitor, keep NPO except sips of liquids.  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  Dispo-F/u with Dr. Derrell Lolling as an OP when discharged from Rehab    LOS: 10 days    Nonie Hoyer 09/21/2014, 12:09 PM Pager: [phone]

## 2014-09-21 NOTE — Progress Notes (Signed)
Physical Therapy Session Note  Patient Details  Name: [name] MRN: [id number] Date of Birth: 26-Mar-1956  Today's Date: 09/21/2014 PT Missed Time: 60 Minutes Missed Time Reason: MD hold (Comment)   Skilled Therapeutic Interventions/Progress Updates:    Pt missed 60 min PT due to being on hold, awaiting clearance for therapy following CT of abdomen.    [name] 09/21/2014, 8:46 AM

## 2014-09-21 NOTE — Progress Notes (Signed)
[facility]     PROGRESS NOTE   CC Abd pain and diarrhea  Subjective/Complaints:   abd pain started in HD on Sat, has persisted, had vomiting yesterday but none today , still with nausea, abd pain is midline along incision, non radiating PMH denies hsx of gallbladder problems  ROS: Pt denies  nausea, vomiting, abdominal pain, diarrhea, chest pain,, anxiety, or depression  Increasing loose stools no abd pain , per d/c summ low threshold for recheck of C diff  Objective: Vital Signs: Blood pressure 102/68, pulse 88, temperature 98.9 F (37.2 C), temperature source Oral, resp. rate 18, height  (1.702 m), weight 98.431 kg (217 lb), SpO2 96 %. No results found.  Recent Labs  09/19/14 1954 09/21/14 0641  WBC 6.6 16.2*  HGB 9.6* 10.1*  HCT 30.4* 32.5*  PLT 275 237    Recent Labs  09/19/14 1954  NA 129*  K 3.5  CL 93*  GLUCOSE 97  BUN 29*  CREATININE 4.92*  CALCIUM 8.0*   CBG (last 3)  No results for input(s): GLUCAP in the last 72 hours.  Wt Readings from Last 3 Encounters:  09/21/14 98.431 kg (217 lb)  09/10/14 98.975 kg (218 lb 3.2 oz)  06/01/14 102.059 kg (225 lb)    Physical Exam:  Gen: alert, no distress, obese HENT:  Poor dentition,  Eyes: EOM are normal.  Neck: Normal range of motion. Neck supple. No thyromegaly present.  Cardiovascular: Normal rate and regular rhythm. no murmurs Respiratory: Effort normal and breath sounds normal. No respiratory distress. occ rhonchi and upper airway sounds. No cough today GI: Bowel sounds are normal.  Abdominal wound is dressed . G-tube site clean and dry , no drainage Skin: left AVG site clean, less tender, minimal edema, 2+ edema RLE, trace edema RLE Neurological: She is alert.  She is oriented to person place date of birth.  LUE 4/5 deltoid, bicep, tricep. 4/5 wrist/hand. RUE grossly 4+/5. LE: 3 RHF, 3 LHF, 3+ RKE, 3 LKE, ankles grossly 3+ to 4-/5. Resting tremor in  arms/legs less apparent today. Intact LT BLE.   cooperative. Attention, awareness, insight are fair Psych: pleasant    Assessment/Plan: 1. 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 Physiatrist is providing close team supervision and 24 hour management of active medical problems listed below. Physiatrist and rehab team continue to assess barriers to discharge/monitor patient progress toward functional and medical goals.  Increased abd pain with nausea,  elevated WBC but afebrile, recheck C diff, , KUB, ask CCS to re eval No hematemesis or melena to suggest new ulcer FIM: FIM - Bathing Bathing Steps Patient Completed: Chest, Right Arm, Left Arm, Abdomen, Front perineal area, Buttocks, Right upper leg, Left upper leg Bathing: 4: Min-Patient completes 8-9 68f 10 parts or 75+ percent  FIM - Upper Body Dressing/Undressing Upper body dressing/undressing steps patient completed: Thread/unthread right bra strap, Thread/unthread left bra strap, Thread/unthread right sleeve of pullover shirt/dresss, Thread/unthread left sleeve of pullover shirt/dress, Put head through opening of pull over shirt/dress, Hook/unhook bra, Pull shirt over trunk Upper body dressing/undressing: 5: Set-up assist to: Obtain clothing/put away FIM - Lower Body Dressing/Undressing Lower body dressing/undressing steps patient completed: Thread/unthread right pants leg, Pull pants up/down, Don/Doff right shoe Lower body dressing/undressing: 2: Max-Patient completed 25-49% of tasks  FIM - Toileting Toileting steps completed by patient: Adjust clothing prior to toileting, Performs perineal  hygiene Toileting Assistive Devices: Grab bar or rail for support Toileting: 3: Mod-Patient completed 2 of 3 steps  FIM - Diplomatic Services operational officer Devices: Grab bars, Elevated toilet seat Toilet Transfers: 4-To  toilet/BSC: Min A (steadying Pt. > 75%), 4-From toilet/BSC: Min A (steadying Pt. > 75%)  FIM - Bed/Chair Transfer Bed/Chair Transfer Assistive Devices: Arm rests, HOB elevated Bed/Chair Transfer: 5: Supine > Sit: Supervision (verbal cues/safety issues), 3: Bed > Chair or W/C: Mod A (lift or lower assist)  FIM - Locomotion: Wheelchair Distance: 50 Locomotion: Wheelchair: 2: Travels 50 - 149 ft with moderate assistance (Pt: 50 - 74%) FIM - Locomotion: Ambulation Locomotion: Ambulation Assistive Devices: Designer, industrial/product Ambulation/Gait Assistance: 4: Min guard Locomotion: Ambulation: 2: Travels 50 - 149 ft with supervision/safety issues  Comprehension Comprehension Mode: Auditory Comprehension: 5-Follows basic conversation/direction: With no assist  Expression Expression Mode: Verbal Expression: 5-Expresses basic needs/ideas: With extra time/assistive device  Social Interaction Social Interaction: 5-Interacts appropriately 90% of the time - Needs monitoring or encouragement for participation or interaction.  Problem Solving Problem Solving: 5-Solves basic 90% of the time/requires cueing < 10% of the time  Memory Memory: 5-Recognizes or recalls 90% of the time/requires cueing < 10% of the time  Medical Problem List and Plan: 1. 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 status post loop recorder/respiratory failure/multi-medical  -tolerating therapies thus far 2. DVT Prophylaxis/Anticoagulation: SCD.  Venous Doppler studies negative, can use TEDs, no need for repeat Doppler 3. Pain Management: Oxycodone as needed. Fair control at present 4. End-stage renal disease. Left arm AV fistula 09/09/2014. Hemodialysis as per renal services 5. Neuropsych: This patient is capable of making decisions on her own behalf. 6. Skin/Wound Care: Routine skin checks/dressing changes as advised to abdominal wound with wet-to-dry, pitting edema worse on hemiparetic side 7.  Fluids/Electrolytes/Nutrition: . Check labs Monday  -continuous TF on board overnight--will cut in half to help increase appetite 8. Hx of C diff: at risk for recurrence, has diarrhea, elevated WBC, recheck 9. Hypertension/intermittent bouts of PSVT. Lopressor 12.5 mg twice a day--rate controlled. 10. Massive pneumoperitoneum and ascites. Status post exploratory laparotomy and closure of perforated pyloric ulcer 07/01/2014.  -maintain dressing changes per Gen surg. 11. Decreased nutritional storage. Advance diet as tolerated. Status post GJ tube 08/12/2014 per interventional radiology 12. Hypothyroidism. Synthroid 13. Mood/anxiety. Ativan as needed 14. Intermittent cough, "?asthma"---pulmicort neb prn effective. May be anxiety component also, statred celexa, monitor 15.  hypoNa- per renal address in HD LOS (Days) 10 A FACE TO FACE EVALUATION WAS PERFORMED  Erick Colace 09/21/2014 8:27 AM

## 2014-09-21 NOTE — Progress Notes (Signed)
Occupational Therapy Note  Patient Details  Name: [name] MRN: [id number] Date of Birth: 05/11/1955  Today's Date: 09/21/2014 OT Missed Time: 150 Minutes Missed Time Reason: MD hold (comment)  Pt missed 60 minute, then 90 minute scheduled OT sessions secondary to being on medical hold due to abdominal pain and CT of abdomen.  Will follow as able.  Rosalio Loud 09/21/2014, 2:22 PM

## 2014-09-22 ENCOUNTER — Inpatient Hospital Stay (HOSPITAL_COMMUNITY): Payer: Medicaid Other

## 2014-09-22 ENCOUNTER — Inpatient Hospital Stay (HOSPITAL_COMMUNITY): Payer: Medicaid Other | Admitting: Occupational Therapy

## 2014-09-22 ENCOUNTER — Inpatient Hospital Stay (HOSPITAL_COMMUNITY): Payer: Medicaid Other | Admitting: Physical Therapy

## 2014-09-22 LAB — CBC WITH DIFFERENTIAL/PLATELET
Basophils Absolute: 0 10*3/uL (ref 0.0–0.1)
Basophils Relative: 0 % (ref 0–1)
Eosinophils Absolute: 0.1 10*3/uL (ref 0.0–0.7)
Eosinophils Relative: 1 % (ref 0–5)
HCT: 33.6 % — ABNORMAL LOW (ref 36.0–46.0)
Hemoglobin: 10.6 g/dL — ABNORMAL LOW (ref 12.0–15.0)
LYMPHS ABS: 0.8 10*3/uL (ref 0.7–4.0)
Lymphocytes Relative: 8 % — ABNORMAL LOW (ref 12–46)
MCH: 29.9 pg (ref 26.0–34.0)
MCHC: 31.5 g/dL (ref 30.0–36.0)
MCV: 94.6 fL (ref 78.0–100.0)
MONOS PCT: 11 % (ref 3–12)
Monocytes Absolute: 1.2 10*3/uL — ABNORMAL HIGH (ref 0.1–1.0)
NEUTROS ABS: 8.5 10*3/uL — AB (ref 1.7–7.7)
Neutrophils Relative %: 80 % — ABNORMAL HIGH (ref 43–77)
Platelets: 270 10*3/uL (ref 150–400)
RBC: 3.55 MIL/uL — ABNORMAL LOW (ref 3.87–5.11)
RDW: 19.3 % — ABNORMAL HIGH (ref 11.5–15.5)
WBC: 10.6 10*3/uL — AB (ref 4.0–10.5)

## 2014-09-22 LAB — GI PATHOGEN PANEL BY PCR, STOOL
C difficile toxin A/B: NOT DETECTED
Campylobacter by PCR: NOT DETECTED
Cryptosporidium by PCR: NOT DETECTED
E coli (ETEC) LT/ST: NOT DETECTED
E coli (STEC): NOT DETECTED
E coli 0157 by PCR: NOT DETECTED
G LAMBLIA BY PCR: NOT DETECTED
Norovirus GI/GII: NOT DETECTED
ROTAVIRUS A BY PCR: NOT DETECTED
Salmonella by PCR: NOT DETECTED
Shigella by PCR: NOT DETECTED

## 2014-09-22 LAB — RENAL FUNCTION PANEL
ANION GAP: 14 (ref 5–15)
Albumin: 2.3 g/dL — ABNORMAL LOW (ref 3.5–5.0)
BUN: 42 mg/dL — ABNORMAL HIGH (ref 6–20)
CHLORIDE: 95 mmol/L — AB (ref 101–111)
CO2: 22 mmol/L (ref 22–32)
Calcium: 8.4 mg/dL — ABNORMAL LOW (ref 8.9–10.3)
Creatinine, Ser: 6.32 mg/dL — ABNORMAL HIGH (ref 0.44–1.00)
GFR calc Af Amer: 8 mL/min — ABNORMAL LOW (ref >60)
GFR calc non Af Amer: 7 mL/min — ABNORMAL LOW (ref >60)
Glucose, Bld: 46 mg/dL — ABNORMAL LOW (ref 65–99)
Phosphorus: 8.6 mg/dL — ABNORMAL HIGH (ref 2.5–4.6)
Potassium: 4.9 mmol/L (ref 3.5–5.1)
SODIUM: 131 mmol/L — AB (ref 135–145)

## 2014-09-22 LAB — CLOSTRIDIUM DIFFICILE BY PCR: CDIFFPCR: NEGATIVE

## 2014-09-22 MED ORDER — IOHEXOL 300 MG/ML  SOLN
80.0000 mL | Freq: Once | INTRAMUSCULAR | Status: AC | PRN
  Administered 2014-09-22: 80 mL via INTRAVENOUS

## 2014-09-22 MED ORDER — IOHEXOL 300 MG/ML  SOLN
25.0000 mL | INTRAMUSCULAR | Status: AC
  Administered 2014-09-22 (×2): 25 mL via ORAL

## 2014-09-22 NOTE — Progress Notes (Signed)
Hemodialysis- Pt tolerated treatment well. Received 2.5 hours of 4 hour treatment. Discontinued early d/t emergency patient in ED per Dr. Hyman Hopes. All vitals WNL. Pt has no complaints. Report called.

## 2014-09-22 NOTE — Progress Notes (Addendum)
Physical Therapy Weekly Progress Note  Patient Details  Name: [name] MRN: [id number] Date of Birth: 1955-12-18  Beginning of progress report period: [name] 11, 2016 End of progress report period: [name] 20, 2016   Patient has met 3 of 4 short term goals.  Pt is minA/supervision overall for bed mobility, transfers, gait and w/c propulsion; modA with performance of 4 stairs. Pt continues to be limited overall by poor endurance, with episode of illness and increased pain from 09/19/14- 09/21/14 which has contributed to limited progress with gait distance and stair performance. Pt overall is making slow progress and is limited by decreased motivation, anxiety, decreased endurance associated with prolonged hospitalization and bedrest.   Patient continues to demonstrate the following deficits: strength deficits LLE>RLE, impaired sitting/standing dynamic balance, poor activity tolerance, pain and therefore will continue to benefit from skilled PT intervention to enhance overall performance with activity tolerance, balance, postural control, ability to compensate for deficits, functional use of  left upper extremity and left lower extremity and coordination.  Patient progressing toward long term goals..  Continue plan of care.  PT Short Term Goals Week 1:  PT Short Term Goal 1 (Week 1): Pt will perform sit<>stand with UE support at min A level  PT Short Term Goal 1 - Progress (Week 1): Met PT Short Term Goal 2 (Week 1): Pt will perform stand pivot transfers with LRAD at min A level  PT Short Term Goal 2 - Progress (Week 1): Met PT Short Term Goal 3 (Week 1): Pt will tolerate dynamic standing task for 2 mins with SaO2 remaining in the 90's.  PT Short Term Goal 3 - Progress (Week 1): Met PT Short Term Goal 4 (Week 1): Pt will ambulate x 71' w/ LRAD at S min/guard level with SaO2 in the 90's.   PT Short Term Goal 4 - Progress (Week 1): Not met Week 2:  PT Short Term Goal 1 (Week 2): Pt will perform  sit<> stand with supervision from w/c height PT Short Term Goal 2 (Week 2): Pt will perform bed <> chair transfer supervision with LRAD PT Short Term Goal 3 (Week 2): Pt will ambulate x 62' w/ LRAD at S min/guard level with SaO2 in the 90's.  PT Short Term Goal 4 (Week 2): Pt will propel w/c 100' with supervision and min cueing PT Short Term Goal 5 (Week 2): Pt will ascend/descend 8 steps with R rail and minA  Skilled Therapeutic Interventions/Progress Updates:      Therapy Documentation Precautions:  Precautions Precautions: Fall Precaution Comments:  abdominal binder, abdominal wound, 2-3L 02 to maintain Sp02 >90% Other Brace/Splint: abdominal binder Restrictions Weight Bearing Restrictions: No Pain: Pain Assessment Pain Assessment: 0-10 Pain Score: 8  Pain Type: Acute pain Pain Location: Sacrum Pain Orientation: Mid Pain Descriptors / Indicators: Aching;Moaning;Grimacing Pain Onset: On-going Patients Stated Pain Goal: 4 Pain Intervention(s): Repositioned Multiple Pain Sites: No Mobility: Bed Mobility Bed Mobility: Supine to Sit;Sit to Supine Supine to Sit: 5: Supervision Supine to Sit Details: Tactile cues for weight shifting;Tactile cues for posture Sit to Supine: 4: Min assist Sit to Supine - Details: Verbal cues for sequencing;Manual facilitation for placement Sit to Supine - Details (indicate cue type and reason): A with LLE management Transfers Transfers: Yes Sit to Stand: 4: Min assist Sit to Stand Details: Verbal cues for sequencing;Verbal cues for technique Sit to Stand Details (indicate cue type and reason): verbal cues for correct foot placement, forward trunk lean Stand to Sit: 5:  Supervision Stand to Sit Details (indicate cue type and reason): Verbal cues for sequencing Stand Pivot Transfers: 4: Min assist Stand Pivot Transfer Details: Visual cues for safe use of DME/AE;Tactile cues for placement Stand Pivot Transfer Details (indicate cue type and reason):  Cues for management of RW and placement of hands on surface prior to sititng Locomotion : Ambulation Ambulation: Yes Ambulation/Gait Assistance: 4: Min guard Ambulation Distance (Feet): 20 Feet Assistive device: Rolling walker Ambulation/Gait Assistance Details: Verbal cues for safe use of DME/AE Gait Gait: Yes Gait Pattern: Impaired Gait Pattern: Decreased stride length;Decreased weight shift to left;Poor foot clearance - left;Poor foot clearance - right;Wide base of support Stairs / Additional Locomotion Stairs: Yes Stairs Assistance: 3: Mod assist Stairs Assistance Details: Verbal cues for sequencing;Verbal cues for gait pattern Stair Management Technique: One rail Right;Step to pattern Number of Stairs: 5 Height of Stairs: 6 Wheelchair Mobility Wheelchair Mobility: Yes Wheelchair Assistance: 4: Advertising account executive Details: Verbal cues for sequencing;Verbal cues for technique;Verbal cues for Information systems manager: Both upper extremities Wheelchair Parts Management: Needs assistance Distance: 100  Balance: Balance Balance Assessed: Yes Static Sitting Balance Static Sitting - Balance Support: Feet supported Static Sitting - Level of Assistance: 5: Stand by assistance Dynamic Sitting Balance Dynamic Sitting - Balance Support: Feet supported Dynamic Sitting - Level of Assistance: 5: Stand by assistance Static Standing Balance Static Standing - Balance Support: During functional activity Static Standing - Level of Assistance: 4: Min assist Dynamic Standing Balance Dynamic Standing - Balance Support: During functional activity;Bilateral upper extremity supported Dynamic Standing - Level of Assistance: 4: Min assist  See FIM for current functional status   Luberta Mutter 09/22/2014, 3:02 PM  Addendum to add short term goals

## 2014-09-22 NOTE — Progress Notes (Signed)
Subjective:    increased abdominal pain and bloating is continued - xray is negative- for CT today- she does say pain is better than yesterday Objective Filed Vitals:   09/21/14 1430 09/21/14 2238 09/22/14 0520 09/22/14 0802  BP:  99/66 108/70   Pulse:  88 80   Temp:   98.7 F (37.1 C)   TempSrc:   Oral   Resp:   18   Height:      Weight:   97.5 kg (214 lb 15.2 oz)   SpO2: 96%  95% 96%   Physical Exam General: alert and oriented, no acute distress.  Heart: RRR Lungs: CTA, unlabored Abdomen: soft, PEG tube. Dressing intact Extremities: +2LE edema bilat up to the hips Dialysis Access:  R IJ catheter/ L AVF +b  Assessment: 1. Pyloric ulcer perf s/p exlap complicated by duod fistula which has healed over- now with worsening sxms but xray seems OK- however with WBC up and worsening sxms planning for abd CT today. 2. ESRD - new start to HD, on TTS, using R IJ catheter; AVF placed 01/28/14 by Dr. Darrick Penna, s/p revision 6/8 per Dr. Arbie Cookey, may use in 2 wks. Plan for next HD today- has been accepted at [facility]- TTS at 6:45 3. Hypotension on midodrine- Hx severe RHF by echo 4. Vol excess - needs more aggressive UF with HD now- should help with sodium- but need to take care so not to drop pressure too much  5. Anemia - Hgb 10.1, Aranesp 200 mcg on Thurs, started Fe 6/9. 6. 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 Nutrition - Alb 2.3, soft diet, vitamin. Still had PEG tube, not using 8. C diff colitis - now negative. 9. Anxiety - Xanax pre-HD 10. CVA  [name]   09/22/2014, 8:43 AM    Additional Objective Labs: Basic Metabolic Panel:  Recent Labs Lab 09/15/14 1745 09/17/14 1630 09/19/14 1954  NA 129* 129* 129*  K 4.4 4.2 3.5  CL 91* 93* 93*  CO2 22 24 23   GLUCOSE 88 95 97  BUN 51* 40* 29*  CREATININE 5.11* 4.73* 4.92*  CALCIUM 8.1* 8.4* 8.0*  PHOS 7.8* 7.8* 7.3*   Liver Function Tests:  Recent Labs Lab 09/15/14 1745 09/17/14 1630  09/19/14 1954  ALBUMIN 2.3* 2.4* 2.3*   No results for input(s): LIPASE, AMYLASE in the last 168 hours. CBC:  Recent Labs Lab 09/15/14 1747 09/17/14 1630 09/19/14 1954 09/21/14 0641  WBC 9.0 6.7 6.6 16.2*  NEUTROABS  --   --   --  12.9*  HGB 9.4* 9.6* 9.6* 10.1*  HCT 29.5* 30.1* 30.4* 32.5*  MCV 93.7 94.4 93.3 96.7  PLT 353 320 275 237   Blood Culture    Component Value Date/Time   SDES ABSCESS PELVIS 08/17/2014 1451   SPECREQUEST NONE 08/17/2014 1451   CULT  08/17/2014 1451    NO GROWTH 3 DAYS Performed at [facility]    REPTSTATUS 08/21/2014 FINAL 08/17/2014 1451    Cardiac Enzymes: No results for input(s): CKTOTAL, CKMB, CKMBINDEX, TROPONINI in the last 168 hours. CBG:  Recent Labs Lab 09/15/14 1146 09/15/14 1206  GLUCAP 65 86   Iron Studies: No results for input(s): IRON, TIBC, TRANSFERRIN, FERRITIN in the last 72 hours. @lablastinr3 @ Studies/Results: Dg Abd Portable 1v  09/21/2014   CLINICAL DATA:  Abdominal pain  EXAM: PORTABLE ABDOMEN - 1 VIEW  COMPARISON:  None.  FINDINGS: There is [name] normal small bowel gas pattern.  [name] gastric feeding tube is noted. Some colonic gas noted in transverse colon and rectum. Degenerative changes lumbar spine.  IMPRESSION: Normal small bowel gas pattern. Percutaneous gastric feeding tube. Degenerative changes lumbar spine.   Electronically Signed   By: Natasha Mead M.D.   On: 09/21/2014 09:17   Medications: . sodium chloride 65 mL/hr at 09/22/14 0705   . ALPRAZolam  0.5 mg Oral Q T,Th,Sa-HD  . amiodarone  400 mg Oral BID  . aspirin  81 mg Oral Daily  . budesonide (PULMICORT) nebulizer solution  0.25 mg Nebulization BID  . calcitRIOL  0.5 mcg Oral Q T,Th,Sat-1800  . chlorhexidine  15 mL Mouth Rinse BID  . citalopram  20 mg Oral Daily  . darbepoetin (ARANESP) injection - DIALYSIS  200 mcg Intravenous Q Thu-HD  . feeding supplement (NEPRO CARB STEADY)  237 mL Oral Q1500  . feeding supplement (PRO-STAT SUGAR FREE 64)  30  mL Per Tube TID BM  . feeding supplement (RESOURCE BREEZE)  1 Container Oral BID BM  . feeding supplement (VITAL AF 1.2 CAL)  1,000 mL Per Tube Q24H  . ipratropium  0.5 mg Nebulization TID  . levalbuterol  0.63 mg Nebulization TID  . levothyroxine  25 mcg Oral QAC breakfast  . midodrine  10 mg Oral Q1200  . multivitamin  1 tablet Oral QHS  . sevelamer carbonate  2.4 g Oral TID  . vitamin C  250 mg Oral Daily

## 2014-09-22 NOTE — Progress Notes (Signed)
Physical Therapy Session Note  Patient Details  Name: [name] MRN: [id number] Date of Birth: 08/04/1955  Today's Date: 09/22/2014 PT Individual Time: 2023-3435 PT Individual Time Calculation (min): 13 min   Short Term Goals: Week 1:  PT Short Term Goal 1 (Week 1): Pt will perform sit<>stand with UE support at min A level  PT Short Term Goal 2 (Week 1): Pt will perform stand pivot transfers with LRAD at min A level  PT Short Term Goal 3 (Week 1): Pt will tolerate dynamic standing task for 2 mins with SaO2 remaining in the 90's.  PT Short Term Goal 4 (Week 1): Pt will ambulate x 52' w/ LRAD at S min/guard level with SaO2 in the 90's.   Week 2:     Skilled Therapeutic Interventions/Progress Updates:    Pt received in w/c with c/o pain as described below. Several minutes spent performing behavior management, A pt with relaxation and providing motivation to participate in treatment. Upon arrival of nursing staff to escort pt to CT scan, pt was instructed in stand pivot transfer w/c > bed with minA/steadyingA. Sit >supine performed with minA to assist with elevating LLE onto bed. Pt remained supine in bed with nursing staff at completion of session. 63 missed minutes due to medical procedure; CT scan.   Therapy Documentation Precautions:  Precautions Precautions: Fall Precaution Comments:  abdominal binder, abdominal wound, 2-3L 02 to maintain Sp02 >90% Other Brace/Splint: abdominal binder Restrictions Weight Bearing Restrictions: No General: PT Amount of Missed Time (min): 62 Minutes PT Missed Treatment Reason: CT/MRI Pain: Pain Assessment Pain Assessment: 0-10 Pain Score: 8  Pain Type: Acute pain Pain Location: Sacrum Pain Orientation: Mid Pain Descriptors / Indicators: Aching;Grimacing;Moaning Pain Onset: On-going Patients Stated Pain Goal: 4 Pain Intervention(s): Repositioned Multiple Pain Sites: No  See FIM for current functional status  Therapy/Group: Individual  Therapy  [name] 09/22/2014, 2:51 PM

## 2014-09-22 NOTE — Progress Notes (Signed)
Occupational Therapy Session Note  Patient Details  Name: [name] MRN: [id number] Date of Birth: March 04, 1956  Today's Date: 09/22/2014 OT Individual Time: 1100-1156 OT Individual Time Calculation (min): 56 min    Short Term Goals: Week 2:  OT Short Term Goal 1 (Week 2): Pt will perform LB dressing with Mod A in order to decrease level of assistance for self care. OT Short Term Goal 2 (Week 2): Pt will complete toilet transfer with RW at supervision level OT Short Term Goal 3 (Week 2): Pt will complete bathing at sink with supervision at sit > stand level OT Short Term Goal 4 (Week 2): Pt will complete 2/3 toileting tasks with supervision  Skilled Therapeutic Interventions/Progress Updates:    Engaged in therapeutic activities with focus on increased participation in therapy session, activity tolerance, BUE and LLE strengthening, and education on goals and progress towards goals.  Pt reports feeling down due to stomach pain and slow progress as her goals are to return to being independent.  Discussed progression towards goals and recommendation of use of AD at this time for ambulation to increase safety and activity tolerance due to decreased endurance and easily fatigued.  Pt declined bathing and dressing due to awaiting abdominal CT and refused sit <> stand activity.  Engaged in [place] for 5 mins with focus on BLE use and activity tolerance, on level 1 resistance with pt requiring multiple rest breaks.  Engaged in ankle pumps with min resistance to promote strengthening.  Engaged in BUE strengthening with level 2 theraband with chest pulls and alternating punches, educated on increasing activity as tolerated.  Pt required multiple rest breaks and encouragement throughout session.  Therapy Documentation Precautions:  Precautions Precautions: Fall Precaution Comments:  abdominal binder, abdominal wound, 2-3L 02 to maintain Sp02 >90% Other Brace/Splint: abdominal  binder Restrictions Weight Bearing Restrictions: No Pain: Pain Assessment Pain Assessment: No/denies pain Pain Score: 8  Pain Type: Acute pain Pain Location: Abdomen Pain Intervention(s): Ambulation/increased activity  See FIM for current functional status  Therapy/Group: Individual Therapy  Rosalio Loud 09/22/2014, 12:27 PM

## 2014-09-22 NOTE — Progress Notes (Signed)
PHYSICAL MEDICINE & REHABILITATION     PROGRESS NOTE   CC Nausea Subjective/Complaints:  No vomiting since Saturday   ROS: Pt denies  , vomiting, abdominal pain,SOB, chest pain,, anxiety, or depression  Increasing loose stools no abd pain , per d/c summ low threshold for recheck of C diff  Objective: Vital Signs: Blood pressure 108/70, pulse 80, temperature 98.7 F (37.1 C), temperature source Oral, resp. rate 18, height  (1.702 m), weight 97.5 kg (214 lb 15.2 oz), SpO2 95 %. Dg Abd Portable 1v  09/21/2014   CLINICAL DATA:  Abdominal pain  EXAM: PORTABLE ABDOMEN - 1 VIEW  COMPARISON:  None.  FINDINGS: There is a normal small bowel gas pattern. A gastric feeding tube is noted. Some colonic gas noted in transverse colon and rectum. Degenerative changes lumbar spine.  IMPRESSION: Normal small bowel gas pattern. Percutaneous gastric feeding tube. Degenerative changes lumbar spine.   Electronically Signed   By: Natasha Mead M.D.   On: 09/21/2014 09:17    Recent Labs  09/19/14 1954 09/21/14 0641  WBC 6.6 16.2*  HGB 9.6* 10.1*  HCT 30.4* 32.5*  PLT 275 237    Recent Labs  09/19/14 1954  NA 129*  K 3.5  CL 93*  GLUCOSE 97  BUN 29*  CREATININE 4.92*  CALCIUM 8.0*   CBG (last 3)  No results for input(s): GLUCAP in the last 72 hours.  Wt Readings from Last 3 Encounters:  09/22/14 97.5 kg (214 lb 15.2 oz)  09/10/14 98.975 kg (218 lb 3.2 oz)  06/01/14 102.059 kg (225 lb)    Physical Exam:  Gen: alert, no distress, obese HENT:  Poor dentition,  Eyes: EOM are normal.  Neck: Normal range of motion. Neck supple. No thyromegaly present.  Cardiovascular: Normal rate and regular rhythm. no murmurs Respiratory: Effort normal and breath sounds normal. No respiratory distress. occ rhonchi and upper airway sounds. No cough today GI: Bowel sounds are normal.  Abdominal wound granulation tissue pink. G-tube site clean and dry , no drainage Skin: left AVG site  clean, less tender, minimal edema, 2+ edema RLE, trace edema RLE Neurological: She is alert.  She is oriented to person place date of birth.  LUE 4/5 deltoid, bicep, tricep. 4/5 wrist/hand. RUE grossly 4+/5. LE: 3 RHF, 3 LHF, 3+ RKE, 3 LKE, ankles grossly 3+ to 4-/5. Resting tremor in arms/legs less apparent today. Intact LT BLE.   cooperative. Attention, awareness, insight are fair Psych: pleasant    Assessment/Plan: 1. 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 Physiatrist is providing close team supervision and 24 hour management of active medical problems listed below. Physiatrist and rehab team continue to assess barriers to discharge/monitor patient progress toward functional and medical goals.  Discussed with surgery PA, episode in HD may have been a low flow state with mesenteric ischemia Will resume PT/OT monitor BPs FIM: FIM - Bathing Bathing Steps Patient Completed: Chest, Right Arm, Left Arm, Abdomen, Front perineal area, Buttocks, Right upper leg, Left upper leg Bathing: 4: Min-Patient completes 8-9 64f 10 parts or 75+ percent  FIM - Upper Body Dressing/Undressing Upper body dressing/undressing steps patient completed: Thread/unthread right bra strap, Thread/unthread left bra strap, Thread/unthread right sleeve of pullover shirt/dresss, Thread/unthread left sleeve of pullover shirt/dress, Put head through opening of pull over shirt/dress, Hook/unhook bra, Pull shirt over trunk Upper body dressing/undressing: 5: Set-up assist to: Obtain clothing/put  away FIM - Lower Body Dressing/Undressing Lower body dressing/undressing steps patient completed: Thread/unthread right pants leg, Pull pants up/down, Don/Doff right shoe Lower body dressing/undressing: 2: Max-Patient completed 25-49% of tasks  FIM - Toileting Toileting steps completed by patient: Adjust clothing prior to  toileting, Performs perineal hygiene Toileting Assistive Devices: Grab bar or rail for support Toileting: 3: Mod-Patient completed 2 of 3 steps  FIM - Diplomatic Services operational officer Devices: Grab bars, Elevated toilet seat Toilet Transfers: 4-To toilet/BSC: Min A (steadying Pt. > 75%), 4-From toilet/BSC: Min A (steadying Pt. > 75%)  FIM - Bed/Chair Transfer Bed/Chair Transfer Assistive Devices: Arm rests, HOB elevated Bed/Chair Transfer: 5: Supine > Sit: Supervision (verbal cues/safety issues), 3: Bed > Chair or W/C: Mod A (lift or lower assist)  FIM - Locomotion: Wheelchair Distance: 50 Locomotion: Wheelchair: 2: Travels 50 - 149 ft with moderate assistance (Pt: 50 - 74%) FIM - Locomotion: Ambulation Locomotion: Ambulation Assistive Devices: Designer, industrial/product Ambulation/Gait Assistance: 4: Min guard Locomotion: Ambulation: 2: Travels 50 - 149 ft with supervision/safety issues  Comprehension Comprehension Mode: Auditory Comprehension: 5-Follows basic conversation/direction: With extra time/assistive device  Expression Expression Mode: Verbal Expression: 5-Expresses basic needs/ideas: With extra time/assistive device  Social Interaction Social Interaction: 4-Interacts appropriately 75 - 89% of the time - Needs redirection for appropriate language or to initiate interaction.  Problem Solving Problem Solving: 5-Solves basic 90% of the time/requires cueing < 10% of the time  Memory Memory: 5-Recognizes or recalls 90% of the time/requires cueing < 10% of the time  Medical Problem List and Plan: 1. 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 status post loop recorder/respiratory failure/multi-medical  -tolerating therapies thus far 2. DVT Prophylaxis/Anticoagulation: SCD.  Venous Doppler studies negative, can use TEDs, no need for repeat Doppler 3. Pain Management: Oxycodone as needed. Fair control at present 4. End-stage renal disease. Left arm AV fistula  09/09/2014. Hemodialysis as per renal services 5. Neuropsych: This patient is capable of making decisions on her own behalf. 6. Skin/Wound Care: Routine skin checks/dressing changes as advised to abdominal wound with wet-to-dry, pitting edema worse on hemiparetic side 7. Fluids/Electrolytes/Nutrition: . Check labs Monday  -continuous TF on board overnight--will cut in half to help increase appetite 8. Hx of C diff: at risk for recurrence, has diarrhea, elevated WBC, recheck 9. Hypertension/intermittent bouts of PSVT. Lopressor 12.5 mg twice a day--rate controlled. 10. Massive pneumoperitoneum and ascites. Status post exploratory laparotomy and closure of perforated pyloric ulcer 07/01/2014.  -maintain dressing changes per Gen surg. 11. Decreased nutritional storage. Advance diet as tolerated. Status post GJ tube 08/12/2014 per interventional radiology 12. Hypothyroidism. Synthroid 13. Mood/anxiety. Ativan as needed 14. Intermittent cough, "?asthma"---pulmicort neb prn effective. May be anxiety component also, statred celexa, monitor 15.  hypoNa- per renal address in HD LOS (Days) 11 A FACE TO FACE EVALUATION WAS PERFORMED  Erick Colace 09/22/2014 7:58 AM

## 2014-09-22 NOTE — Progress Notes (Signed)
[name], female   DOB: [birth year], 59 y.o.   MRN: [id number]     [facility]      [address]   [place], [place] [address]    Phone: [phone] FAX: [phone]     Subjective: No n/v.  NPO.  Sore, but no pain.  Afebrile.    Objective:  Vital signs:  Filed Vitals:   09/21/14 1430 09/21/14 2238 09/22/14 0520 09/22/14 0802  BP:  99/66 108/70   Pulse:  88 80   Temp:   98.7 F (37.1 C)   TempSrc:   Oral   Resp:   18   Height:      Weight:   97.5 kg (214 lb 15.2 oz)   SpO2: 96%  95% 96%    Last BM Date: 09/21/14  Intake/Output   Yesterday:  06/20 0701 - 06/21 0700 In: 10 [I.V.:10] Out: -  This shift:    I/O last 3 completed shifts: In: 160 [P.O.:120; I.V.:10; NG/GT:30] Out: -     Physical Exam: General: Pt awake/alert/oriented x4 in no acute distress  Abdomen: Soft.  Nondistended. Minimally tender to RUQ.  Midline dressing is c/d/i.  No evidence of peritonitis.  No incarcerated hernias.    Problem List:   Principal Problem:   Acute right ACA stroke Active Problems:   End stage renal disease   Chronic diastolic heart failure   Enteritis due to Clostridium difficile   Left hemiparesis    Results:   Labs: Results for orders placed or performed during the hospital encounter of 09/11/14 (from the past 48 hour(s))  CBC with Differential/Platelet     Status: Abnormal   Collection Time: 09/21/14  6:41 AM  Result Value Ref Range   WBC 16.2 (H) 4.0 - 10.5 K/uL   RBC 3.36 (L) 3.87 - 5.11 MIL/uL   Hemoglobin 10.1 (L) 12.0 - 15.0 g/dL   HCT 38.3 (L) 81.8 - 40.3 %   MCV 96.7 78.0 - 100.0 fL   MCH 30.1 26.0 - 34.0 pg   MCHC 31.1 30.0 - 36.0 g/dL   RDW 75.4 (H) 36.0 - 67.7 %   Platelets 237 150 - 400 K/uL   Neutrophils Relative % 80 (H) 43 - 77 %   Lymphocytes Relative 8 (L) 12 - 46 %   Monocytes Relative 11 3 - 12 %   Eosinophils Relative 1 0 - 5 %   Basophils Relative 0 0 - 1 %   Neutro  Abs 12.9 (H) 1.7 - 7.7 K/uL   Lymphs Abs 1.3 0.7 - 4.0 K/uL   Monocytes Absolute 1.8 (H) 0.1 - 1.0 K/uL   Eosinophils Absolute 0.2 0.0 - 0.7 K/uL   Basophils Absolute 0.0 0.0 - 0.1 K/uL   RBC Morphology RARE NRBCs     Comment: POLYCHROMASIA PRESENT    Imaging / Studies: Dg Abd Portable 1v  09/21/2014   CLINICAL DATA:  Abdominal pain  EXAM: PORTABLE ABDOMEN - 1 VIEW  COMPARISON:  None.  FINDINGS: There is a normal small bowel gas pattern. A gastric feeding tube is noted. Some colonic gas noted in transverse colon and rectum. Degenerative changes lumbar spine.  IMPRESSION: Normal small bowel gas pattern. Percutaneous gastric feeding tube. Degenerative changes lumbar spine.   Electronically Signed   By: Natasha Mead M.D.   On: 09/21/2014 09:17    Medications / Allergies:  Scheduled Meds: . ALPRAZolam  0.5 mg Oral Q T,Th,Sa-HD  . amiodarone  400 mg Oral BID  . aspirin  81 mg Oral Daily  . budesonide (PULMICORT) nebulizer solution  0.25 mg Nebulization BID  . calcitRIOL  0.5 mcg Oral Q T,Th,Sat-1800  . chlorhexidine  15 mL Mouth Rinse BID  . citalopram  20 mg Oral Daily  . darbepoetin (ARANESP) injection - DIALYSIS  200 mcg Intravenous Q Thu-HD  . feeding supplement (NEPRO CARB STEADY)  237 mL Oral Q1500  . feeding supplement (PRO-STAT SUGAR FREE 64)  30 mL Per Tube TID BM  . feeding supplement (RESOURCE BREEZE)  1 Container Oral BID BM  . feeding supplement (VITAL AF 1.2 CAL)  1,000 mL Per Tube Q24H  . ipratropium  0.5 mg Nebulization TID  . levalbuterol  0.63 mg Nebulization TID  . levothyroxine  25 mcg Oral QAC breakfast  . midodrine  10 mg Oral Q1200  . multivitamin  1 tablet Oral QHS  . sevelamer carbonate  2.4 g Oral TID  . vitamin C  250 mg Oral Daily   Continuous Infusions: . sodium chloride 65 mL/hr at 09/22/14 0705   PRN Meds:.alum & mag hydroxide-simeth, bisacodyl, bisacodyl, camphor-menthol, diphenhydrAMINE-zinc acetate, diphenoxylate-atropine, levalbuterol, ondansetron  **OR** ondansetron (ZOFRAN) IV, oxyCODONE, sodium chloride, sorbitol  Antibiotics: Anti-infectives    Start     Dose/Rate Route Frequency Ordered Stop   09/11/14 2300  metroNIDAZOLE (FLAGYL) tablet 500 mg  Status:  Discontinued     500 mg Oral 3 times per day 09/11/14 1650 09/14/14 1514       Assessment/Plan S/p Exploratory Laparotomy with graham patch closure of perforated pyloric ulcer, SBR for ischemic bowel - 07/01/2014 - Derrell Lolling  Duodenal fistula-resolved Intra-abdominal fluid collection-resolved -d/w Dr. Carolynne Edouard, will proceed with a CT of A/P to rule out further surgical complications.  I suspect she had a low flow state, however, with elevated white count we need to rule out an abscess/leak.      Ashok Norris, [facility] Pager [phone]) For consults and floor pages call [phone](7A-4:30P)  09/22/2014 8:43 AM

## 2014-09-22 NOTE — Progress Notes (Signed)
Physical Therapy Session Note  Patient Details  Name: [name] MRN: [id number] Date of Birth: Aug 22, 1955  Today's Date: 09/22/2014 PT Individual Time: 0800-0900 PT Individual Time Calculation (min): 60 min   Short Term Goals:  Skilled Therapeutic Interventions/Progress Updates:   AM session: Pt supine in bed with c/o pain as described below; however is agreeable to treatment. Pt requires increased time and motivation to participate. Pt instructed in sitting on EOB to participate in RT treatment to improve sitting and mobility tolerance.  Pt instructed in standing for three trials of approximately 2 min, requiring extended seated rest breaks between trials due to fatigue and LE weakness. Pt instructed in gait for 5' with RW and supervision, however requiring seated rest break in w/c following completion due to fatigue. Pt instructed in gait from w/c to toilet for approx 10 ft using RW and supervision; utilizing grab bars and requiring no physical assistance for toilet transfer. Pt instructed in sitting at sink to perform hand washing and personal hygiene. Pt seated in w/c at completion of session with all needs within reach and hand off to nurse.   Therapy Documentation Precautions:  Precautions Precautions: Fall Precaution Comments:  abdominal binder, abdominal wound, 2-3L 02 to maintain Sp02 >90% Other Brace/Splint: abdominal binder Restrictions Weight Bearing Restrictions: No Pain: Pain Assessment Pain Assessment: 0-10 Pain Score: 8  Pain Type: Acute pain Pain Location: Abdomen Pain Orientation: Left Pain Descriptors / Indicators: Discomfort;Aching Pain Onset: On-going Patients Stated Pain Goal: 4 Pain Intervention(s): Ambulation/increased activity Multiple Pain Sites: No Locomotion : Ambulation Ambulation/Gait Assistance: 4: Min guard   See FIM for current functional status  Therapy/Group: Individual Therapy  [name] 09/22/2014, 12:35 PM

## 2014-09-23 ENCOUNTER — Inpatient Hospital Stay (HOSPITAL_COMMUNITY): Payer: Self-pay | Admitting: Rehabilitation

## 2014-09-23 ENCOUNTER — Inpatient Hospital Stay (HOSPITAL_COMMUNITY): Payer: Self-pay | Admitting: Physical Therapy

## 2014-09-23 ENCOUNTER — Inpatient Hospital Stay (HOSPITAL_COMMUNITY): Payer: Medicaid Other | Admitting: Occupational Therapy

## 2014-09-23 ENCOUNTER — Inpatient Hospital Stay (HOSPITAL_COMMUNITY): Payer: Self-pay | Admitting: Occupational Therapy

## 2014-09-23 MED ORDER — CARBAMIDE PEROXIDE 6.5 % OT SOLN
5.0000 [drp] | Freq: Two times a day (BID) | OTIC | Status: AC
  Administered 2014-09-23 – 2014-09-25 (×6): 5 [drp] via OTIC
  Filled 2014-09-23: qty 15

## 2014-09-23 MED ORDER — VITAL AF 1.2 CAL PO LIQD
1000.0000 mL | ORAL | Status: DC
  Administered 2014-09-23 – 2014-09-24 (×2): 1000 mL
  Filled 2014-09-23 (×3): qty 1000

## 2014-09-23 NOTE — Progress Notes (Signed)
Occupational Therapy Session Note  Patient Details  Name: [name] Headings MRN: [id number] Date of Birth: [birth year]  Today's Date: 09/23/2014 OT Individual Time: 0813-8871 OT Individual Time Calculation (min): 60 min    Short Term Goals: Week 2:  OT Short Term Goal 1 (Week 2): Pt will perform LB dressing with Mod A in order to decrease level of assistance for self care. OT Short Term Goal 2 (Week 2): Pt will complete toilet transfer with RW at supervision level OT Short Term Goal 3 (Week 2): Pt will complete bathing at sink with supervision at sit > stand level OT Short Term Goal 4 (Week 2): Pt will complete 2/3 toileting tasks with supervision  Skilled Therapeutic Interventions/Progress Updates:    Engaged in ADL retraining with focus on activity tolerance, functional transfers, sit <> stand, and increased participation in LB dressing.  Upon arrival, pt reports needing to toilet.  Stand pivot transfer bed > w/[name] with min assist with pt requiring increased time and encouragement for sit > stand due to anxiousness.  Min assist stand pivot transfer to toilet with use of grab bars for steady assist. Bathing and dressing completed at sit > stand level at sink with increased time for rest breaks due to DOE.  Pt O2 dropped to low 70s after transfer to toilet and BM, RN notified.  O2 returned to mid 90s on 2L O2 after 1 minute and cues for pursed lip breathing.  Pt required total assist with LB dressing this session secondary to fatigue and fearfulness with bending forward.  Pt reports needing to toilet again, performed stand pivot to toilet with min assist and left on toilet with nurse tech present.  Therapy Documentation Precautions:  Precautions Precautions: Fall Precaution Comments:  abdominal binder, abdominal wound, 2-3L 02 to maintain Sp02 >90% Other Brace/Splint: abdominal binder Restrictions Weight Bearing Restrictions: No General:   Vital Signs: Therapy Vitals Pulse Rate: 81 Resp:  16 Patient Position (if appropriate): Lying Oxygen Therapy SpO2: 96 % O2 Device: Nasal Cannula O2 Flow Rate (L/min): 2 L/min Pain:  Pt with [name]/o soreness in abdomen. RN aware and premedicated.  See FIM for current functional status  Therapy/Group: Individual Therapy  Rosalio Loud 09/23/2014, 10:09 AM

## 2014-09-23 NOTE — Progress Notes (Signed)
Occupational Therapy Session Note  Patient Details  Name: [name] MRN: [id number] Date of Birth: [birth year]  Today's Date: 09/23/2014 OT Individual Time: 1130-1200 OT Individual Time Calculation (min): 30 min    Short Term Goals: Week 1:  OT Short Term Goal 1 (Week 1): Pt will engage in 10 minutes of functional activity without O2 stats dropping below 90% while on 2L of O2 or less. OT Short Term Goal 1 - Progress (Week 1): Met OT Short Term Goal 2 (Week 1): Pt will perform toilet transfer onto standard toilet with min A in order to increase functional transfers. OT Short Term Goal 2 - Progress (Week 1): Met OT Short Term Goal 3 (Week 1): When bathing at sink, pt will perform with min A balance during STS. OT Short Term Goal 3 - Progress (Week 1): Met OT Short Term Goal 4 (Week 1): Pt will perform LB dressing with Mod A in order to decrease level of assistance for self care. OT Short Term Goal 4 - Progress (Week 1): Progressing toward goal  Skilled Therapeutic Interventions/Progress Updates:    Pt stated her goals during out session was to work on her legs despite her current pain. Pt participated in therapeutic activity including sit to stand, standing tolerance and marching place with and without RW. Activities addressed her static and dynamic balance, LE strengthening, achieving a more forward weight shift during sit to stand and activity tolerance. Pt varied in assistance needed for sit to stand from mod A to min A with more forward weight shift. Pt able to stand at a minute and 15 seconds at a time before required a seated rest break.   Therapy Documentation Precautions:  Precautions Precautions: Fall Precaution Comments:  abdominal binder, abdominal wound, 2-3L 02 to maintain Sp02 >90% Other Brace/Splint: abdominal binder Restrictions Weight Bearing Restrictions: No Pain: Pain Assessment Pain Assessment: 0-10 Pain Score: 7  Pain Type: Acute pain Pain Location: Leg Pain  Orientation: Left Pain Descriptors / Indicators: Aching;Moaning Pain Onset: On-going Patients Stated Pain Goal: 4 Pain Intervention(s): rest and RN administered meds Multiple Pain Sites: No  See FIM for current functional status  Therapy/Group: Individual Therapy  Willeen Cass [facility] 09/23/2014, 6:54 PM

## 2014-09-23 NOTE — Progress Notes (Signed)
Physical Therapy Session Note  Patient Details  Name: [name] MRN: [id number] Date of Birth: 1956-03-11  Today's Date: 09/23/2014 PT Individual Time: 4627-0350 PT Individual Time Calculation (min): 70 min   Short Term Goals: Week 2:  PT Short Term Goal 1 (Week 2): Pt will perform sit<> stand with supervision from w/c height PT Short Term Goal 2 (Week 2): Pt will perform bed <> chair transfer supervision with LRAD PT Short Term Goal 3 (Week 2): Pt will ambulate x 62' w/ LRAD at S min/guard level with SaO2 in the 90's.  PT Short Term Goal 4 (Week 2): Pt will propel w/c 100' with supervision and min cueing PT Short Term Goal 5 (Week 2): Pt will ascend/descend 8 steps with R rail and minA  Skilled Therapeutic Interventions/Progress Updates:   Pt received sitting in w/c in room, reluctantly agreed to therapy session.  Pt self propelled x 50' with BUEs at S level for overall strengthening and endurance.  Tolerated well with increased time to complete task and rest breaks.  Assisted remainder of distance to therapy gym and transferred via RW at min A level with mod A to stand and facilitation for forward weight shift and cues for hand placement and safety.  Performed seated nustep x 7 mins with BUEs/LEs for overall strengthening, activity tolerance and NMR in LLE with hip/knee ext.  Tolerated well with target given of 20 steps per min however with increased difficulty due to fatigue and poor activity tolerance.  Intermittent rest breaks needed due to fatigue.  Ambulated short distance to therapy mat with RW at min A level to perform supine therex as follows; BLE bridging x 10 reps, LLE only bridging x 10 reps, L SAQ's x 10 reps and assisted L SLR x 10 reps.  Transitioned back to sitting with min A and cues for sequencing and technique.  Ambulated short distance back to w/c as above.  Assisted back to room and transferred to bed and prior to sitting was able to adjust clothing in preparation for  donning gown.  Pt able to remove shirt on her own.  Assisted with donning gown and mod A to elevate LEs into bed.  Left in bed with all needs in reach.    Therapy Documentation Precautions:  Precautions Precautions: Fall Precaution Comments:  abdominal binder, abdominal wound, 2-3L 02 to maintain Sp02 >90% Other Brace/Splint: abdominal binder Restrictions Weight Bearing Restrictions: No   Pain: Pt with min c/o pain in L LE due to swelling,  Performed supine therex to assist in decreasing swelling.   See FIM for current functional status  Therapy/Group: Individual Therapy  Vista Deck 09/23/2014, 12:04 PM

## 2014-09-23 NOTE — Progress Notes (Addendum)
Physical Therapy Session Note  Patient Details  Name: [name] MRN: [id number] Date of Birth: 04/12/1955  Today's Date: 09/23/2014 PT Individual Time: 1300-1430 PT Individual Time Calculation (min): 90 min (30 min make-up)  Short Term Goals: Week 2:  PT Short Term Goal 1 (Week 2): Pt will perform sit<> stand with supervision from w/c height PT Short Term Goal 2 (Week 2): Pt will perform bed <> chair transfer supervision with LRAD PT Short Term Goal 3 (Week 2): Pt will ambulate x 45' w/ LRAD at S min/guard level with SaO2 in the 90's.  PT Short Term Goal 4 (Week 2): Pt will propel w/c 100' with supervision and min cueing PT Short Term Goal 5 (Week 2): Pt will ascend/descend 8 steps with R rail and minA  Skilled Therapeutic Interventions/Progress Updates:    Pt received in w/c with report of pain as described below; agreeable to treatment. Throughout session pt is very lethargic, c/o discomfort and expresses signs of anxiety; repetitive education performed throughout session to reduce anxiety and reassure pt of progress and slow healing process. Pt instructed in gait x10 ft using RW and CGA/supervision. Pt reports unable to ambulate further due to LLE discomfort. O2 measured at 88%; O2 via [place] increased to 3L for remainder of session. Following extended rest break due to fatigue, pt instructed in ascent/descent of 5 6-inch stairs using B handrails and minA/CGA. Pt again very fatigued and requires extended seated rest break. Pt instructed in standing activity with dynamic UE task while engaged in Wii games; performed to improve standing endurance as well as LE strength. Pt performs 2 trials of approximately 8 min each, with seated rest break between. Pt instructed in standing marching in parallel bars to improve LE strength and activity tolerance. Pt able to perform 20 total marching steps prior to requesting seated rest break; second trial of 20 steps performed once rested. Pt returned to room and  remained seated in w/c with all needs within reach at completion of session. Pt limited overall by poor activity tolerance, anxiety, and LE strength deficits. Requires increased time to perform all activities due to poor motivation, pt anxiety, and frequent rest breaks due to fatigue.   Therapy Documentation Precautions:  Precautions Precautions: Fall Precaution Comments:  abdominal binder, abdominal wound, 2-3L 02 to maintain Sp02 >90% Other Brace/Splint: abdominal binder Restrictions Weight Bearing Restrictions: No Pain: Pain Assessment Pain Assessment: 0-10 Pain Score: 7  Pain Type: Acute pain Pain Location: Leg Pain Orientation: Left Pain Descriptors / Indicators: Aching;Moaning Pain Onset: On-going Patients Stated Pain Goal: 4 Pain Intervention(s): Ambulation/increased activity Multiple Pain Sites: No  See FIM for current functional status  Therapy/Group: Individual Therapy  [name] 09/23/2014, 3:47 PM

## 2014-09-23 NOTE — Progress Notes (Signed)
Nutrition Follow-up  DOCUMENTATION CODES:  Obesity unspecified  INTERVENTION: Provide Resource Breeze po BID, each supplement provides 250 kcal and 9 grams of protein.  Provide Nepro Shake po once daily, each supplement provides 425 kcal and 19 grams protein.  Encourage adequate PO intake.   Nocturnal TF regimen: Increase Vital AF 1.2 formula to new goal of 70 ml/hr x 6 hours (infuse 8 PM to 2 AM) with 30 ml Prostat TID via J-tube to provide 804 kcals (40% of kcal needs), 77 gm protein (70% of protein needs), 340 ml of free water.  RD to continue to monitor.   NUTRITION DIAGNOSIS:  Increased nutrient needs related to chronic illness as evidenced by estimated needs; ongoing  GOAL:  Patient will meet greater than or equal to 90% of their needs; not met  MONITOR:  PO intake, Supplement acceptance, TF tolerance, Weight trends, Labs, I & O's  REASON FOR ASSESSMENT:  Consult Calorie Count  ASSESSMENT: Pt with history of stage V chronic kidney disease status post fistula placement now on HD, hypertension, tobacco abuse. Presented 06/17/2014 with progressive SOB 2 weeks and lower extremity edema. 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 CT scan showed massive pneumoperitoneum and ascites and also pneumatosis of a short segment of the ileum. Pt underwent exploratory laparotomy evacuation of ascites with closure of perforated pyloric ulcer with omental patch and segmental resection of ileum 07/01/2014. G-J tube placed 5/11.  Pt reports she has been having abdominal discomfort, distension, and diarrhea which has been causing a lack of appetite and nausea. Meal completion has been 0-15%. Pt has been refusing her supplements more recently as she is afraid they will cause abdominal pains. RD increase nocturnal tube feeds as pt with poor po intake. Pt was encouraged to eat her food at meals and to drink her  supplements. RD to continue to monitor.   Labs: Low sodium, chloride, calcium, and GFR. High BUN, creatinine, phosphorous (8.6).  Height:  Ht Readings from Last 1 Encounters:  09/11/14 $RemoveB'5\' 7"'[id number]$  (1.702 m)    Weight:  Wt Readings from Last 1 Encounters:  09/23/14 220 lb 0.3 oz (99.8 kg)    Ideal Body Weight:  61 kg  Wt Readings from Last 10 Encounters:  09/23/14 220 lb 0.3 oz (99.8 kg)  09/10/14 218 lb 3.2 oz (98.975 kg)  06/01/14 225 lb (102.059 kg)  01/28/14 230 lb (104.327 kg)  01/21/14 231 lb 1.6 oz (104.826 kg)  10/02/12 216 lb 8 oz (98.204 kg)    BMI:  Body mass index is 34.45 kg/(m^2).   Adjusted body weight: 92.85 kg  Estimated Nutritional Needs:  Kcal:  2000-2200  Protein:  110-125 grams  Fluid:  Per MD  Skin:   Stage I pressure ulcer on sacrum, incision on abdomen and arm, wound on abdomen; non-pitting UE, +2 LLE edema  Diet Order:  DIET SOFT Room service appropriate?: Yes; Fluid consistency:: Thin  EDUCATION NEEDS:  No education needs identified at this time   Intake/Output Summary (Last 24 hours) at 09/23/14 1509 Last data filed at 09/23/14 2094  Gross per 24 hour  Intake    120 ml  Output   1355 ml  Net  -1235 ml    Last BM:  6/20  Corrin Parker, MS, RD, LDN Pager # [phone] After hours/ weekend pager # [phone]

## 2014-09-23 NOTE — Progress Notes (Signed)
Subjective:    Abdominal CT seems negative- had HD last night removed 1300 tolerated well Objective Filed Vitals:   09/22/14 2100 09/22/14 2213 09/23/14 0547 09/23/14 0741  BP: 112/68 91/58 99/62    Pulse: 74 85 79 81  Temp: 97.8 F (36.6 C)  98 F (36.7 C)   TempSrc: Oral  Oral   Resp: Height:      Weight: 99.1 kg (218 lb 7.6 oz)  99.8 kg (220 lb 0.3 oz)   SpO2: 97% 92% 97% 96%   Physical Exam General: alert and oriented, no acute distress.  Heart: RRR Lungs: CTA, unlabored Abdomen: soft, PEG tube. Dressing intact Extremities: +2LE edema bilat up to the hips Dialysis Access:  R IJ catheter/ L AVF +b  Assessment: 1. Pyloric ulcer perf s/p exlap complicated by duod fistula which has healed over- now with worsening sxms but xray/CT seems OK- she may just be feeling overall volume overload 2. ESRD - new start to HD, on TTS, using R IJ catheter; AVF placed 01/28/14 by Dr. Darrick Penna, s/p revision 6/8 per Dr. Arbie Cookey, may use in 2 wks. Plan for next HD tomorrow- has been accepted at [facility] TTS at 6:45 AM 3. Hypotension on midodrine- Hx severe RHF by echo- needs volume off 4. Vol excess - needs more aggressive UF with HD now- should help with sodium- but need to take care so not to drop pressure too much  5. Anemia - Hgb 10.1, Aranesp 200 mcg on Thurs, started Fe 6/9. 6. 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 Nutrition - Alb 2.4, soft diet, vitamin. Still had PEG tube, not using 8. C diff colitis - now negative. 9. Anxiety - Xanax pre-HD 10. CVA  Carl Butner A   09/23/2014, 10:36 AM    Additional Objective Labs: Basic Metabolic Panel:  Recent Labs Lab 09/17/14 1630 09/19/14 1954 09/22/14 1750  NA 129* 129* 131*  K 4.2 3.5 4.9  CL 93* 93* 95*  CO2 GLUCOSE 95 97 46*  BUN 40* 29* 42*  CREATININE 4.73* 4.92* 6.32*  CALCIUM 8.4* 8.0* 8.4*  PHOS 7.8* 7.3* 8.6*   Liver Function Tests:  Recent Labs Lab 09/17/14 1630  09/19/14 1954 09/22/14 1750  ALBUMIN 2.4* 2.3* 2.3*   No results for input(s): LIPASE, AMYLASE in the last 168 hours. CBC:  Recent Labs Lab 09/17/14 1630 09/19/14 1954 09/21/14 0641 09/22/14 1025  WBC 6.7 6.6 16.2* 10.6*  NEUTROABS  --   --  12.9* 8.5*  HGB 9.6* 9.6* 10.1* 10.6*  HCT 30.1* 30.4* 32.5* 33.6*  MCV 94.4 93.3 96.7 94.6  PLT 320 275 237 270   Blood Culture    Component Value Date/Time   SDES ABSCESS PELVIS 08/17/2014 1451   SPECREQUEST NONE 08/17/2014 1451   CULT  08/17/2014 1451    NO GROWTH 3 DAYS Performed at [facility]    REPTSTATUS 08/21/2014 FINAL 08/17/2014 1451    Cardiac Enzymes: No results for input(s): CKTOTAL, CKMB, CKMBINDEX, TROPONINI in the last 168 hours. CBG: No results for input(s): GLUCAP in the last 168 hours. Iron Studies: No results for input(s): IRON, TIBC, TRANSFERRIN, FERRITIN in the last 72 hours. @ Studies/Results: Ct Abdomen Pelvis W Contrast  09/22/2014   CLINICAL DATA:  59 year old female with continued abdominal pain and bloating and leukocytosis. Status post exploratory laparotomy with repair of perforated pyloric ulcer. Patient with enteritis secondary to Clostridium difficile.  EXAM: CT  ABDOMEN AND PELVIS WITH CONTRAST  TECHNIQUE: Multidetector CT imaging of the abdomen and pelvis was performed using the standard protocol following bolus administration of intravenous contrast.  CONTRAST:  6mL OMNIPAQUE IOHEXOL 300 MG/ML  SOLN  COMPARISON:  09/10/2014 and prior exam  FINDINGS: Lower chest: A small right pleural effusion, tiny left pleural effusion and mild bibasilar atelectasis now noted.  Hepatobiliary: The liver is unremarkable. High-density material within the gallbladder may represent vicarious excretion. There is no evidence of biliary dilatation.  Pancreas: Unremarkable  Spleen: Unremarkable  Adrenals/Urinary Tract: The kidneys are slightly atrophic. A stable 1.9 cm hypodense lesion in the anterior mid  left kidney is unchanged. There is no evidence of hydronephrosis or urinary calculi.  Stomach/Bowel: A percutaneous gastrostomy tube is again identified. There is no evidence of bowel obstruction or pneumoperitoneum. There is apparent mild circumferential wall thickening of the cecum and ascending colon and a colitis is not excluded. The appendix is normal.  Vascular/Lymphatic: A 3 cm infrarenal suprailiac abdominal aortic aneurysm is unchanged. No enlarged lymph nodes identified.  Reproductive: Calcified fibroids are noted. Uterus and adnexal regions have a similar appearance.  Other: Small amount of free fluid in the abdomen and pelvis identified. Diffuse subcutaneous edema has increased. There is no evidence of pneumoperitoneum or definite abscess.  Musculoskeletal: No acute or suspicious abnormalities identified. Degenerative changes in the lumbar spine are again noted.  IMPRESSION: New appearance wall thickening of the proximal colon which - question colitis.  New small right and tiny left pleural effusion, small amount of ascites and increasing subcutaneous edema.  No evidence of discrete abscess or pneumoperitoneum.  Unchanged 3 cm infrarenal suprailiac abdominal aortic aneurysm.   Electronically Signed   By: Harmon Pier M.D.   On: 09/22/2014 15:46   Medications: . sodium chloride 65 mL/hr at 09/23/14 0702   . ALPRAZolam  0.5 mg Oral Q T,Th,Sa-HD  . amiodarone  400 mg Oral BID  . aspirin  81 mg Oral Daily  . budesonide (PULMICORT) nebulizer solution  0.25 mg Nebulization BID  . calcitRIOL  0.5 mcg Oral Q T,Th,Sat-1800  . chlorhexidine  15 mL Mouth Rinse BID  . citalopram  20 mg Oral Daily  . darbepoetin (ARANESP) injection - DIALYSIS  200 mcg Intravenous Q Thu-HD  . feeding supplement (NEPRO CARB STEADY)  237 mL Oral Q1500  . feeding supplement (PRO-STAT SUGAR FREE 64)  30 mL Per Tube TID BM  . feeding supplement (RESOURCE BREEZE)  1 Container Oral BID BM  . feeding supplement (VITAL AF 1.2  CAL)  1,000 mL Per Tube Q24H  . ipratropium  0.5 mg Nebulization TID  . levalbuterol  0.63 mg Nebulization TID  . levothyroxine  25 mcg Oral QAC breakfast  . midodrine  10 mg Oral Q1200  . multivitamin  1 tablet Oral QHS  . sevelamer carbonate  2.4 g Oral TID  . vitamin C  250 mg Oral Daily

## 2014-09-23 NOTE — Progress Notes (Signed)
[facility]     PROGRESS NOTE   CC Nausea Subjective/Complaints: Still with diarrhea and "swelling" in abd .  No pain, no vomiting, almost inc of stool this am   ROS: Pt denies , abdominal pain,SOB, chest pain,, anxiety, or depression, although the pt appears anxious when discussing her bowel complaints    Objective: Vital Signs: Blood pressure 99/62, pulse 81, temperature 98 F (36.7 C), temperature source Oral, resp. rate 16, height $RemoveBe'5\' 7"'[id number]$  (1.702 m), weight 99.8 kg (220 lb 0.3 oz), SpO2 96 %. Ct Abdomen Pelvis W Contrast  09/22/2014   CLINICAL DATA:  59 year old female with continued abdominal pain and bloating and leukocytosis. Status post exploratory laparotomy with repair of perforated pyloric ulcer. Patient with enteritis secondary to Clostridium difficile.  EXAM: CT ABDOMEN AND PELVIS WITH CONTRAST  TECHNIQUE: Multidetector CT imaging of the abdomen and pelvis was performed using the standard protocol following bolus administration of intravenous contrast.  CONTRAST:  53mL OMNIPAQUE IOHEXOL 300 MG/ML  SOLN  COMPARISON:  09/10/2014 and prior exam  FINDINGS: Lower chest: A small right pleural effusion, tiny left pleural effusion and mild bibasilar atelectasis now noted.  Hepatobiliary: The liver is unremarkable. High-density material within the gallbladder may represent vicarious excretion. There is no evidence of biliary dilatation.  Pancreas: Unremarkable  Spleen: Unremarkable  Adrenals/Urinary Tract: The kidneys are slightly atrophic. A stable 1.9 cm hypodense lesion in the anterior mid left kidney is unchanged. There is no evidence of hydronephrosis or urinary calculi.  Stomach/Bowel: A percutaneous gastrostomy tube is again identified. There is no evidence of bowel obstruction or pneumoperitoneum. There is apparent mild circumferential wall thickening of the cecum and ascending colon and a colitis is not excluded. The appendix is normal.   Vascular/Lymphatic: A 3 cm infrarenal suprailiac abdominal aortic aneurysm is unchanged. No enlarged lymph nodes identified.  Reproductive: Calcified fibroids are noted. Uterus and adnexal regions have a similar appearance.  Other: Small amount of free fluid in the abdomen and pelvis identified. Diffuse subcutaneous edema has increased. There is no evidence of pneumoperitoneum or definite abscess.  Musculoskeletal: No acute or suspicious abnormalities identified. Degenerative changes in the lumbar spine are again noted.  IMPRESSION: New appearance wall thickening of the proximal colon which - question colitis.  New small right and tiny left pleural effusion, small amount of ascites and increasing subcutaneous edema.  No evidence of discrete abscess or pneumoperitoneum.  Unchanged 3 cm infrarenal suprailiac abdominal aortic aneurysm.   Electronically Signed   By: Margarette Canada M.D.   On: 09/22/2014 15:46    Recent Labs  09/21/14 0641 09/22/14 1025  WBC 16.2* 10.6*  HGB 10.1* 10.6*  HCT 32.5* 33.6*  PLT 237 270    Recent Labs  09/22/14 1750  NA 131*  K 4.9  CL 95*  GLUCOSE 46*  BUN 42*  CREATININE 6.32*  CALCIUM 8.4*   CBG (last 3)  No results for input(s): GLUCAP in the last 72 hours.  Wt Readings from Last 3 Encounters:  09/23/14 99.8 kg (220 lb 0.3 oz)  09/10/14 98.975 kg (218 lb 3.2 oz)  06/01/14 102.059 kg (225 lb)    Physical Exam:  Gen: alert, no distress, obese HENT:  Poor dentition,  Eyes: EOM are normal.  Neck: Normal range of motion. Neck supple. No thyromegaly present.  Cardiovascular: Normal rate and regular rhythm. no murmurs Respiratory: Effort normal and breath sounds normal. No respiratory distress. occ rhonchi and upper airway sounds. No cough today  GI: Bowel sounds are normal.  Abdominal wound granulation tissue pink. G-tube site clean and dry , no drainage Skin: left AVG site clean, less tender, minimal edema, 2+ edema RLE, trace edema  RLE Neurological: She is alert.  She is oriented to person place date of birth.  LUE 4/5 deltoid, bicep, tricep. 4/5 wrist/hand. RUE grossly 4+/5. LE: 3 RHF, 3 LHF, 3+ RKE, 3 LKE, ankles grossly 3+ to 4-/5. Resting tremor in arms/legs less apparent today. Intact LT BLE.   cooperative. Attention, awareness, insight are fair Psych: pleasant    Assessment/Plan: 1. 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 Physiatrist is providing close team supervision and 24 hour management of active medical problems listed below. Physiatrist and rehab team continue to assess barriers to discharge/monitor patient progress toward functional and medical goals. Team conference today please see physician documentation under team conference tab, met with team face-to-face to discuss problems,progress, and goals. Formulized individual treatment plan based on medical history, underlying problem and comorbidities. Increasing loose stools no abd pain , per d/c summ low threshold for recheck of C diff Appreciate Gen Surg assistance FIM: FIM - Bathing Bathing Steps Patient Completed: Chest, Right Arm, Left Arm, Abdomen, Front perineal area, Buttocks, Right upper leg, Left upper leg Bathing: 4: Min-Patient completes 8-9 50f 10 parts or 75+ percent  FIM - Upper Body Dressing/Undressing Upper body dressing/undressing steps patient completed: Thread/unthread right bra strap, Thread/unthread left bra strap, Thread/unthread right sleeve of pullover shirt/dresss, Thread/unthread left sleeve of pullover shirt/dress, Put head through opening of pull over shirt/dress, Hook/unhook bra, Pull shirt over trunk Upper body dressing/undressing: 5: Set-up assist to: Obtain clothing/put away FIM - Lower Body Dressing/Undressing Lower body dressing/undressing steps patient completed: Thread/unthread right pants leg, Pull pants  up/down, Don/Doff right shoe Lower body dressing/undressing: 2: Max-Patient completed 25-49% of tasks  FIM - Toileting Toileting steps completed by patient: Adjust clothing prior to toileting, Performs perineal hygiene Toileting Assistive Devices: Grab bar or rail for support Toileting: 3: Mod-Patient completed 2 of 3 steps  FIM - Radio producer Devices: Grab bars, Elevated toilet seat Toilet Transfers: 5-To toilet/BSC: Supervision (verbal cues/safety issues), 5-From toilet/BSC: Supervision (verbal cues/safety issues)  FIM - Control and instrumentation engineer Devices: Arm rests Bed/Chair Transfer: 4: Supine > Sit: Min A (steadying Pt. > 75%/lift 1 leg), 4: Sit > Supine: Min A (steadying pt. > 75%/lift 1 leg), 4: Bed > Chair or W/C: Min A (steadying Pt. > 75%), 4: Chair or W/C > Bed: Min A (steadying Pt. > 75%)  FIM - Locomotion: Wheelchair Distance: 100 Locomotion: Wheelchair: 2: Travels 50 - 149 ft with moderate assistance (Pt: 50 - 74%) FIM - Locomotion: Ambulation Locomotion: Ambulation Assistive Devices: Administrator Ambulation/Gait Assistance: 4: Min guard Locomotion: Ambulation: 1: Travels less than 50 ft with supervision/safety issues  Comprehension Comprehension Mode: Auditory Comprehension: 5-Follows basic conversation/direction: With extra time/assistive device  Expression Expression Mode: Verbal Expression: 5-Expresses basic needs/ideas: With extra time/assistive device  Social Interaction Social Interaction: 4-Interacts appropriately 75 - 89% of the time - Needs redirection for appropriate language or to initiate interaction.  Problem Solving Problem Solving: 5-Solves basic 90% of the time/requires cueing < 10% of the time  Memory Memory: 5-Recognizes or recalls 90% of the time/requires cueing < 10% of the time  Medical Problem List and Plan: 1. 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 status post loop  recorder/respiratory failure/multi-medical  -tolerating therapies thus far 2. DVT Prophylaxis/Anticoagulation: SCD.  Venous Doppler studies negative, can use TEDs, no need for repeat Doppler 3. Pain Management: Oxycodone as needed. Fair control at present 4. End-stage renal disease. Left arm AV fistula 09/09/2014. Hemodialysis as per renal services 5. Neuropsych: This patient is capable of making decisions on her own behalf. 6. Skin/Wound Care: Routine skin checks/dressing changes as advised to abdominal wound with wet-to-dry, pitting edema worse on hemiparetic side TEDS 7. Fluids/Electrolytes/Nutrition: . Check labs Monday  -continuous TF on board overnight- 8. Hx of C diff: at risk for recurrence, has diarrhea, elevated WBC, recheck 9. Hypertension/intermittent bouts of PSVT. Lopressor 12.5 mg twice a day--rate controlled. 10. Massive pneumoperitoneum and ascites. Status post exploratory laparotomy and closure of perforated pyloric ulcer 07/01/2014.  -maintain dressing changes per Gen surg.Repeat CT shows possible colitis in prox lg colon, pt with dirrhea, await repeat c diff 11. Decreased nutritional storage. Advance diet as tolerated. Status post GJ tube 08/12/2014 per interventional radiology 12. Hypothyroidism. Synthroid 13. Mood/anxiety. Ativan as needed 14. Intermittent cough, "?asthma"---pulmicort neb prn effective. May be anxiety component also, on celexa, monitor 15.  hypoNa- per renal address in HD LOS (Days) 12 A FACE TO FACE EVALUATION WAS PERFORMED  Daimon Kean E 09/23/2014 9:12 AM

## 2014-09-23 NOTE — Patient Care Conference (Signed)
Inpatient RehabilitationTeam Conference and Plan of Care Update Date: 09/23/2014   Time: 11;05 AM    Patient Name: [name]      Medical Record Number: [id number]  Date of Birth: May 07, 1955 Sex: Female         Room/Bed: 4W17C/4W17C-01 Payor Info: Payor: MEDICAID [place] / Plan: MEDICAID [place] ACCESS / Product Type: *No Product type* /    Admitting Diagnosis: R CVA  Admit Date/Time:  09/11/2014  4:43 PM Admission Comments: No comment available   Primary Diagnosis:  Acute right ACA stroke Principal Problem: Acute right ACA stroke  Patient Active Problem List   Diagnosis Date Noted  . Chronic ischemic colitis 09/25/2014  . Acute right ACA stroke 09/11/2014  . Left hemiparesis 09/11/2014  . History of ETT   . Duodenal fistula   . Sepsis   . Aortic aneurysm 08/13/2014  . Protein-calorie malnutrition   . Itching 08/12/2014  . Duodenal anastomotic leak   . Protein calorie malnutrition   . SVT (supraventricular tachycardia)   . Right heart failure   . ESRD (end stage renal disease)   . Abdominal pain   . Palliative care encounter   . 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 Fistula   . History of intermediate V/Q scan   . Other emphysema   . Intra-abdominal abscess   . Abnormal TSH   . Enteritis due to Clostridium difficile 07/19/2014  . Abdominal abscess   . Pelvic fluid collection   . Perforated gastric ulcer 07/01/2014  . SOB (shortness of breath)   . PJRT (permanent junctional reciprocating tachycardia)   . Paroxysmal atrial tachycardia   . PSVT (paroxysmal supraventricular tachycardia)   . Stroke   . Hyperlipidemia   . 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 Hypertension 06/20/2014  . ESRD needing dialysis 06/20/2014  . Obesity (BMI 30-39.9) 06/20/2014  . Depression 06/20/2014  . Left renal mass 06/20/2014  . Chronic diastolic heart failure 06/20/2014  . H1N1 influenza 06/20/2014  . Tobacco use disorder 06/20/2014  . ARF (acute  renal failure)   . Renal failure (ARF), acute on chronic   . Acute respiratory failure with hypoxia 06/17/2014  . End stage renal disease 01/21/2014    Expected Discharge Date: Expected Discharge Date: 10/02/14  Team Members Present: Physician leading conference: Dr. Claudette Laws Social Worker Present: Dossie Der, LCSW Nurse Present: Carlean Purl, RN PT Present: Edman Circle, PT;Caroline Adriana Simas, PT;Other (comment) Luanne Bras Tygielski-PT) OT Present: Primitivo Gauze, OT;James D[place]hea [place], OT SLP Present: Jackalyn Lombard, SLP PPS Coordinator present : Tora Duck, RN, CRRN     Current Status/Progress Goal Weekly Team Focus  Medical   had diarrhea, Off IVF  able to amb 12 steps with 1 person assist  Stairs   Bowel/Bladder   Continent bowel and bladder; C-Diff. neg.; GI profile neg.  Precautions D/Ced.  Anticipate toileting needs; less frequent loose stools, tolerates tube feeds.( 8p-2a daily.)  Monitor; lomotil PRN.   Swallow/Nutrition/ Hydration     na        ADL's   supervision-min assist bathing, supervision UB dressing, mod assist LB dressing, min-mod assist transfers, decreased activity tolerance  supervision overall  activity tolerance, sit <> stand, transfers, dynamic standing balance   Mobility   minA/supervision with transfers/gait, min/mod stairs, severely reduced activity tolerance  supervision/minA overall gait, transfers, and w/c mobility  activity tolerance, LE strength, stairs, gait endurance/safety   Communication     na        Safety/Cognition/  Behavioral Observations    no unsafe behaviors        Pain   Occasional Oxy. IR 5 mg for c/o generalized discomfort.  Taking 1x day.  Managed at goal, 3/10.  Monitor, assess for non-verbal s/s pain.   Skin   Abd. incision wet-dry dressing; pink tissue, min. serous drainage. J-Tube site CDI.  AVG site CDI.  No s/s infection, no injury this admission.  Dressing changes per orders, monitor; assist turns.       *See Care Plan and progress notes for long and short-term goals.  Barriers to Discharge: poor endurance    Possible Resolutions to Barriers:  will extend LOS    Discharge Planning/Teaching Needs:  HOme with children providing care-scheduling for HD as OP. Will need to begin family education      Team Discussion:  With pt's medical issues-missed therapy sessions-decreased level and requiring O2 now. Need to extend to reach supervision level goals. Off precautions and feeling better today. Stop IV's. Pt anxious about discharge date wants to be ready  Revisions to Treatment Plan:  Extended stay due to medical issues and missed therapies   Continued Need for Acute Rehabilitation Level of Care: The patient requires daily medical management by a physician with specialized training in physical medicine and rehabilitation for the following conditions: Daily direction of a multidisciplinary physical rehabilitation program to ensure safe treatment while eliciting the highest outcome that is of practical value to the patient.: Yes Daily medical management of patient stability for increased activity during participation in an intensive rehabilitation regime.: Yes Daily analysis of laboratory values and/or radiology reports with any subsequent need for medication adjustment of medical intervention for : Post surgical problems;Pulmonary problems  [name], Lemar Livings 09/25/2014, 8:37 AM

## 2014-09-23 NOTE — Progress Notes (Signed)
[name], female   DOB: [birth year], 59 y.o.   MRN: [id number]     [place]      [address] [place], [place] [address]    Phone: [phone] FAX: [phone]     Subjective: Sore.  Objective:  Vital signs:  Filed Vitals:   09/22/14 2100 09/22/14 2213 09/23/14 0547 09/23/14 0741  BP: 1$Rem'12/68 91/58 99/62 'DSFX$   Pulse: 74 85 79 81  Temp: 97.8 F (36.6 C)  98 F (36.7 C)   TempSrc: Oral  Oral   Resp: $Remo'16  17 16  'lYlrH$ Height:      Weight: 99.1 kg (218 lb 7.6 oz)  99.8 kg (220 lb 0.3 oz)   SpO2: 97% 92% 97% 96%    Last BM Date: 09/21/14  Intake/Output   Yesterday:  06/21 0701 - 06/22 0700 In: -  Out: 1355  This shift:    I/O last 3 completed shifts: In: -  Out: 6222 [Other:1355]    Physical Exam: General: Pt awake/alert/oriented x4 in no acute distress  Abdomen: Soft.  Nondistended. Mildly tender around incisions.  Wound is clean, fascia is intact. No evidence of peritonitis.  No incarcerated hernias.    Problem List:   Principal Problem:   Acute right ACA stroke Active Problems:   End stage renal disease   Chronic diastolic heart failure   Enteritis due to Clostridium difficile   Left hemiparesis    Results:   Labs: Results for orders placed or performed during the hospital encounter of 09/11/14 (from the past 48 hour(s))  CBC with Differential/Platelet     Status: Abnormal   Collection Time: 09/22/14 10:25 AM  Result Value Ref Range   WBC 10.6 (H) 4.0 - 10.5 K/uL   RBC 3.55 (L) 3.87 - 5.11 MIL/uL   Hemoglobin 10.6 (L) 12.0 - 15.0 g/dL   HCT 33.6 (L) 36.0 - 46.0 %   MCV 94.6 78.0 - 100.0 fL   MCH 29.9 26.0 - 34.0 pg   MCHC 31.5 30.0 - 36.0 g/dL   RDW 19.3 (H) 11.5 - 15.5 %   Platelets 270 150 - 400 K/uL   Neutrophils Relative % 80 (H) 43 - 77 %   Neutro Abs 8.5 (H) 1.7 - 7.7 K/uL   Lymphocytes Relative 8 (L) 12 - 46 %   Lymphs Abs 0.8 0.7 - 4.0 K/uL   Monocytes Relative 11 3 - 12 %    Monocytes Absolute 1.2 (H) 0.1 - 1.0 K/uL   Eosinophils Relative 1 0 - 5 %   Eosinophils Absolute 0.1 0.0 - 0.7 K/uL   Basophils Relative 0 0 - 1 %   Basophils Absolute 0.0 0.0 - 0.1 K/uL  Clostridium Difficile by PCR (not at [facility])     Status: None   Collection Time: 09/22/14  1:45 PM  Result Value Ref Range   C difficile by pcr NEGATIVE NEGATIVE  Renal function panel     Status: Abnormal   Collection Time: 09/22/14  5:50 PM  Result Value Ref Range   Sodium 131 (L) 135 - 145 mmol/L   Potassium 4.9 3.5 - 5.1 mmol/L   Chloride 95 (L) 101 - 111 mmol/L   CO2 22 22 - 32 mmol/L   Glucose, Bld 46 (L) 65 - 99 mg/dL   BUN 42 (H) 6 - 20 mg/dL   Creatinine, Ser 6.32 (H) 0.44 - 1.00 mg/dL   Calcium 8.4 (L) 8.9 - 10.3  mg/dL   Phosphorus 8.6 (H) 2.5 - 4.6 mg/dL   Albumin 2.3 (L) 3.5 - 5.0 g/dL   GFR calc non Af Amer 7 (L) >60 mL/min   GFR calc Af Amer 8 (L) >60 mL/min    Comment: (NOTE) The eGFR has been calculated using the CKD EPI equation. This calculation has not been validated in all clinical situations. eGFR's persistently <60 mL/min signify possible Chronic Kidney Disease.    Anion gap 14 5 - 15    Imaging / Studies: Ct Abdomen Pelvis W Contrast  09/22/2014   CLINICAL DATA:  59 year old female with continued abdominal pain and bloating and leukocytosis. Status post exploratory laparotomy with repair of perforated pyloric ulcer. Patient with enteritis secondary to Clostridium difficile.  EXAM: CT ABDOMEN AND PELVIS WITH CONTRAST  TECHNIQUE: Multidetector CT imaging of the abdomen and pelvis was performed using the standard protocol following bolus administration of intravenous contrast.  CONTRAST:  5mL OMNIPAQUE IOHEXOL 300 MG/ML  SOLN  COMPARISON:  09/10/2014 and prior exam  FINDINGS: Lower chest: A small right pleural effusion, tiny left pleural effusion and mild bibasilar atelectasis now noted.  Hepatobiliary: The liver is unremarkable. High-density material within the gallbladder  may represent vicarious excretion. There is no evidence of biliary dilatation.  Pancreas: Unremarkable  Spleen: Unremarkable  Adrenals/Urinary Tract: The kidneys are slightly atrophic. A stable 1.9 cm hypodense lesion in the anterior mid left kidney is unchanged. There is no evidence of hydronephrosis or urinary calculi.  Stomach/Bowel: A percutaneous gastrostomy tube is again identified. There is no evidence of bowel obstruction or pneumoperitoneum. There is apparent mild circumferential wall thickening of the cecum and ascending colon and a colitis is not excluded. The appendix is normal.  Vascular/Lymphatic: A 3 cm infrarenal suprailiac abdominal aortic aneurysm is unchanged. No enlarged lymph nodes identified.  Reproductive: Calcified fibroids are noted. Uterus and adnexal regions have a similar appearance.  Other: Small amount of free fluid in the abdomen and pelvis identified. Diffuse subcutaneous edema has increased. There is no evidence of pneumoperitoneum or definite abscess.  Musculoskeletal: No acute or suspicious abnormalities identified. Degenerative changes in the lumbar spine are again noted.  IMPRESSION: New appearance wall thickening of the proximal colon which - question colitis.  New small right and tiny left pleural effusion, small amount of ascites and increasing subcutaneous edema.  No evidence of discrete abscess or pneumoperitoneum.  Unchanged 3 cm infrarenal suprailiac abdominal aortic aneurysm.   Electronically Signed   By: Margarette Canada M.D.   On: 09/22/2014 15:46   Dg Abd Portable 1v  09/21/2014   CLINICAL DATA:  Abdominal pain  EXAM: PORTABLE ABDOMEN - 1 VIEW  COMPARISON:  None.  FINDINGS: There is a normal small bowel gas pattern. A gastric feeding tube is noted. Some colonic gas noted in transverse colon and rectum. Degenerative changes lumbar spine.  IMPRESSION: Normal small bowel gas pattern. Percutaneous gastric feeding tube. Degenerative changes lumbar spine.   Electronically  Signed   By: Lahoma Crocker M.D.   On: 09/21/2014 09:17    Medications / Allergies:  Scheduled Meds: . ALPRAZolam  0.5 mg Oral Q T,Th,Sa-HD  . amiodarone  400 mg Oral BID  . aspirin  81 mg Oral Daily  . budesonide (PULMICORT) nebulizer solution  0.25 mg Nebulization BID  . calcitRIOL  0.5 mcg Oral Q T,Th,Sat-1800  . chlorhexidine  15 mL Mouth Rinse BID  . citalopram  20 mg Oral Daily  . darbepoetin (ARANESP) injection - DIALYSIS  200  mcg Intravenous Q Thu-HD  . feeding supplement (NEPRO CARB STEADY)  237 mL Oral Q1500  . feeding supplement (PRO-STAT SUGAR FREE 64)  30 mL Per Tube TID BM  . feeding supplement (RESOURCE BREEZE)  1 Container Oral BID BM  . feeding supplement (VITAL AF 1.2 CAL)  1,000 mL Per Tube Q24H  . ipratropium  0.5 mg Nebulization TID  . levalbuterol  0.63 mg Nebulization TID  . levothyroxine  25 mcg Oral QAC breakfast  . midodrine  10 mg Oral Q1200  . multivitamin  1 tablet Oral QHS  . sevelamer carbonate  2.4 g Oral TID  . vitamin C  250 mg Oral Daily   Continuous Infusions: . sodium chloride 65 mL/hr at 09/23/14 0702   PRN Meds:.alum & mag hydroxide-simeth, bisacodyl, bisacodyl, camphor-menthol, diphenhydrAMINE-zinc acetate, diphenoxylate-atropine, levalbuterol, ondansetron **OR** ondansetron (ZOFRAN) IV, oxyCODONE, sodium chloride, sorbitol  Antibiotics: Anti-infectives    Start     Dose/Rate Route Frequency Ordered Stop   09/11/14 2300  metroNIDAZOLE (FLAGYL) tablet 500 mg  Status:  Discontinued     500 mg Oral 3 times per day 09/11/14 1650 09/14/14 1514       Assessment/Plan S/p Exploratory Laparotomy with graham patch closure of perforated pyloric ulcer, SBR for ischemic bowel - 07/01/2014 - Dalbert Batman  Duodenal fistula-resolved Intra-abdominal fluid collection-resolved CT suggests colitis, negative c diff on 6/10 Abdominal exam fairly benign, tolerated POs that family brought in.  May resume POs and nocturnal TF as tolerated.     Erby Pian,  [facility] Surgery Pager [phone]) For consults and floor pages call [phone](7A-4:30P)  09/23/2014 9:08 AM

## 2014-09-24 ENCOUNTER — Inpatient Hospital Stay (HOSPITAL_COMMUNITY): Payer: Self-pay | Admitting: Occupational Therapy

## 2014-09-24 ENCOUNTER — Inpatient Hospital Stay (HOSPITAL_COMMUNITY): Payer: Medicaid Other | Admitting: Occupational Therapy

## 2014-09-24 ENCOUNTER — Inpatient Hospital Stay (HOSPITAL_COMMUNITY): Payer: Self-pay | Admitting: Physical Therapy

## 2014-09-24 DIAGNOSIS — R5381 Other malaise: Secondary | ICD-10-CM

## 2014-09-24 LAB — CBC
HCT: 30.5 % — ABNORMAL LOW (ref 36.0–46.0)
Hemoglobin: 9.6 g/dL — ABNORMAL LOW (ref 12.0–15.0)
MCH: 29.4 pg (ref 26.0–34.0)
MCHC: 31.5 g/dL (ref 30.0–36.0)
MCV: 93.6 fL (ref 78.0–100.0)
PLATELETS: 262 10*3/uL (ref 150–400)
RBC: 3.26 MIL/uL — AB (ref 3.87–5.11)
RDW: 18.8 % — AB (ref 11.5–15.5)
WBC: 8.2 10*3/uL (ref 4.0–10.5)

## 2014-09-24 LAB — RENAL FUNCTION PANEL
ALBUMIN: 2.3 g/dL — AB (ref 3.5–5.0)
Anion gap: 11 (ref 5–15)
BUN: 28 mg/dL — ABNORMAL HIGH (ref 6–20)
CO2: 24 mmol/L (ref 22–32)
Calcium: 8.5 mg/dL — ABNORMAL LOW (ref 8.9–10.3)
Chloride: 99 mmol/L — ABNORMAL LOW (ref 101–111)
Creatinine, Ser: 5.77 mg/dL — ABNORMAL HIGH (ref 0.44–1.00)
GFR calc non Af Amer: 7 mL/min — ABNORMAL LOW (ref >60)
GFR, EST AFRICAN AMERICAN: 8 mL/min — AB (ref >60)
Glucose, Bld: 80 mg/dL (ref 65–99)
Phosphorus: 6.2 mg/dL — ABNORMAL HIGH (ref 2.5–4.6)
Potassium: 4.6 mmol/L (ref 3.5–5.1)
Sodium: 134 mmol/L — ABNORMAL LOW (ref 135–145)

## 2014-09-24 MED ORDER — DARBEPOETIN ALFA 200 MCG/0.4ML IJ SOSY
PREFILLED_SYRINGE | INTRAMUSCULAR | Status: AC
  Filled 2014-09-24: qty 0.4

## 2014-09-24 NOTE — Progress Notes (Signed)
Social Work [name], female   DOB: 07-23-1955, 59 y.o.   MRN: [id number] Met with pt and left a message for daughter to discuss team conference progress toward her goals and the missed therapies due to medical issues end of last week and beginning of this week. Pt is glad team extended to 7/1.  Have contacted Bethena Roys with HD she will change pt;s OP HD appointment. Will get family in next week to do family training with pt. Work on discharge planning. Await daughter's return call.

## 2014-09-24 NOTE — Progress Notes (Signed)
Subjective:    Belly is even better today  Objective Filed Vitals:   09/23/14 1309 09/23/14 1446 09/23/14 2004 09/24/14 0514  BP:  109/77  99/63  Pulse: 83 77 76 78  Temp:  97.5 F (36.4 C)  98.7 F (37.1 C)  TempSrc:  Oral  Oral  Resp: 16 18 18 18   Height:      Weight:    99 kg (218 lb 4.1 oz)  SpO2: 100% 95%  97%   Physical Exam General: alert and oriented, no acute distress.  Heart: RRR Lungs: CTA, unlabored Abdomen: soft, PEG tube. Dressing intact Extremities: +2LE edema bilat up to the hips Dialysis Access:  R IJ catheter/ L AVF +b  Assessment: 1. Pyloric ulcer perf s/p exlap complicated by duod fistula which has healed over- now with worsening sxms but xray/CT seems OK- she may just be feeling overall volume overload 2. ESRD - new start to HD, on TTS schedule, using R IJ catheter; AVF placed 01/28/14 by Dr. Darrick Penna, s/p revision 6/8 per Dr. Arbie Cookey, may use in 2 wks. Plan for next HD today- has been accepted at [facility]- TTS at 6:45 AM upon discharge 3. Hypotension on midodrine- Hx severe RHF by echo- needs volume off 4. Vol excess - needs more aggressive UF with HD now- should help with sodium- but need to take care so not to drop pressure too much  5. Anemia - Hgb 10.6, Aranesp 200 mcg on Thurs, started Fe 6/9.- improving 6. 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 Nutrition - Alb 2.4, soft diet, vitamin. Still had PEG tube, not using 8. C diff colitis - now negative. 9. Anxiety - Xanax pre-HD 10. CVA  Iriel Nason A   09/24/2014, 9:50 AM    Additional Objective Labs: Basic Metabolic Panel:  Recent Labs Lab 09/17/14 1630 09/19/14 1954 09/22/14 1750  NA 129* 129* 131*  K 4.2 3.5 4.9  CL 93* 93* 95*  CO2 24 23 22   GLUCOSE 95 97 46*  BUN 40* 29* 42*  CREATININE 4.73* 4.92* 6.32*  CALCIUM 8.4* 8.0* 8.4*  PHOS 7.8* 7.3* 8.6*   Liver Function Tests:  Recent Labs Lab 09/17/14 1630 09/19/14 1954 09/22/14 1750  ALBUMIN 2.4*  2.3* 2.3*   No results for input(s): LIPASE, AMYLASE in the last 168 hours. CBC:  Recent Labs Lab 09/17/14 1630 09/19/14 1954 09/21/14 0641 09/22/14 1025  WBC 6.7 6.6 16.2* 10.6*  NEUTROABS  --   --  12.9* 8.5*  HGB 9.6* 9.6* 10.1* 10.6*  HCT 30.1* 30.4* 32.5* 33.6*  MCV 94.4 93.3 96.7 94.6  PLT 320 275 237 270   Blood Culture    Component Value Date/Time   SDES ABSCESS PELVIS 08/17/2014 1451   SPECREQUEST NONE 08/17/2014 1451   CULT  08/17/2014 1451    NO GROWTH 3 DAYS Performed at [facility]    REPTSTATUS 08/21/2014 FINAL 08/17/2014 1451    Cardiac Enzymes: No results for input(s): CKTOTAL, CKMB, CKMBINDEX, TROPONINI in the last 168 hours. CBG: No results for input(s): GLUCAP in the last 168 hours. Iron Studies: No results for input(s): IRON, TIBC, TRANSFERRIN, FERRITIN in the last 72 hours. @lablastinr3 @ Studies/Results: Ct Abdomen Pelvis W Contrast  09/22/2014   CLINICAL DATA:  60 year old female with continued abdominal pain and bloating and leukocytosis. Status post exploratory laparotomy with repair of perforated pyloric ulcer. Patient with enteritis secondary to Clostridium difficile.  EXAM: CT ABDOMEN AND PELVIS WITH CONTRAST  TECHNIQUE:  Multidetector CT imaging of the abdomen and pelvis was performed using the standard protocol following bolus administration of intravenous contrast.  CONTRAST:  80mL OMNIPAQUE IOHEXOL 300 MG/ML  SOLN  COMPARISON:  09/10/2014 and prior exam  FINDINGS: Lower chest: A small right pleural effusion, tiny left pleural effusion and mild bibasilar atelectasis now noted.  Hepatobiliary: The liver is unremarkable. High-density material within the gallbladder may represent vicarious excretion. There is no evidence of biliary dilatation.  Pancreas: Unremarkable  Spleen: Unremarkable  Adrenals/Urinary Tract: The kidneys are slightly atrophic. A stable 1.9 cm hypodense lesion in the anterior mid left kidney is unchanged. There is no  evidence of hydronephrosis or urinary calculi.  Stomach/Bowel: A percutaneous gastrostomy tube is again identified. There is no evidence of bowel obstruction or pneumoperitoneum. There is apparent mild circumferential wall thickening of the cecum and ascending colon and a colitis is not excluded. The appendix is normal.  Vascular/Lymphatic: A 3 cm infrarenal suprailiac abdominal aortic aneurysm is unchanged. No enlarged lymph nodes identified.  Reproductive: Calcified fibroids are noted. Uterus and adnexal regions have a similar appearance.  Other: Small amount of free fluid in the abdomen and pelvis identified. Diffuse subcutaneous edema has increased. There is no evidence of pneumoperitoneum or definite abscess.  Musculoskeletal: No acute or suspicious abnormalities identified. Degenerative changes in the lumbar spine are again noted.  IMPRESSION: New appearance wall thickening of the proximal colon which - question colitis.  New small right and tiny left pleural effusion, small amount of ascites and increasing subcutaneous edema.  No evidence of discrete abscess or pneumoperitoneum.  Unchanged 3 cm infrarenal suprailiac abdominal aortic aneurysm.   Electronically Signed   By: Harmon Pier M.D.   On: 09/22/2014 15:46   Medications: . sodium chloride 65 mL/hr at 09/23/14 0702   . ALPRAZolam  0.5 mg Oral Q T,Th,Sa-HD  . amiodarone  400 mg Oral BID  . aspirin  81 mg Oral Daily  . budesonide (PULMICORT) nebulizer solution  0.25 mg Nebulization BID  . calcitRIOL  0.5 mcg Oral Q T,Th,Sat-1800  . carbamide peroxide  5 drop Right Ear BID  . chlorhexidine  15 mL Mouth Rinse BID  . citalopram  20 mg Oral Daily  . darbepoetin (ARANESP) injection - DIALYSIS  200 mcg Intravenous Q Thu-HD  . feeding supplement (NEPRO CARB STEADY)  237 mL Oral Q1500  . feeding supplement (PRO-STAT SUGAR FREE 64)  30 mL Per Tube TID BM  . feeding supplement (RESOURCE BREEZE)  1 Container Oral BID BM  . feeding supplement (VITAL AF  1.2 CAL)  1,000 mL Per Tube Q24H  . ipratropium  0.5 mg Nebulization TID  . levalbuterol  0.63 mg Nebulization TID  . levothyroxine  25 mcg Oral QAC breakfast  . midodrine  10 mg Oral Q1200  . multivitamin  1 tablet Oral QHS  . sevelamer carbonate  2.4 g Oral TID  . vitamin C  250 mg Oral Daily

## 2014-09-24 NOTE — Progress Notes (Signed)
[place] [facility]ICAL MEDICINE & REHABILITATION     PROGRESS NOTE   CC Nausea Subjective/Complaints: No issues overnite, starting to eat, tolerating TF, diarrhea resolving   ROS: Pt denies , abdominal pain,SOB, chest pain,, anxiety, or depression, although the pt appears anxious when discussing her bowel complaints    Objective: Vital Signs: Blood pressure 99/63, pulse 78, temperature 98.7 F (37.1 C), temperature source Oral, resp. rate 18, height  (1.702 m), weight 99 kg (218 lb 4.1 oz), SpO2 97 %. Ct Abdomen Pelvis W Contrast  09/22/2014   CLINICAL DATA:  59 year old female with continued abdominal pain and bloating and leukocytosis. Status post exploratory laparotomy with repair of perforated pyloric ulcer. Patient with enteritis secondary to Clostridium difficile.  EXAM: CT ABDOMEN AND PELVIS WITH CONTRAST  TECHNIQUE: Multidetector CT imaging of the abdomen and pelvis was performed using the standard protocol following bolus administration of intravenous contrast.  CONTRAST:  80mL OMNIPAQUE IOHEXOL 300 MG/ML  SOLN  COMPARISON:  09/10/2014 and prior exam  FINDINGS: Lower chest: A small right pleural effusion, tiny left pleural effusion and mild bibasilar atelectasis now noted.  Hepatobiliary: The liver is unremarkable. High-density material within the gallbladder may represent vicarious excretion. There is no evidence of biliary dilatation.  Pancreas: Unremarkable  Spleen: Unremarkable  Adrenals/Urinary Tract: The kidneys are slightly atrophic. A stable 1.9 cm hypodense lesion in the anterior mid left kidney is unchanged. There is no evidence of hydronephrosis or urinary calculi.  Stomach/Bowel: A percutaneous gastrostomy tube is again identified. There is no evidence of bowel obstruction or pneumoperitoneum. There is apparent mild circumferential wall thickening of the cecum and ascending colon and a colitis is not excluded. The appendix is normal.  Vascular/Lymphatic: A 3 cm infrarenal  suprailiac abdominal aortic aneurysm is unchanged. No enlarged lymph nodes identified.  Reproductive: Calcified fibroids are noted. Uterus and adnexal regions have a similar appearance.  Other: Small amount of free fluid in the abdomen and pelvis identified. Diffuse subcutaneous edema has increased. There is no evidence of pneumoperitoneum or definite abscess.  Musculoskeletal: No acute or suspicious abnormalities identified. Degenerative changes in the lumbar spine are again noted.  IMPRESSION: New appearance wall thickening of the proximal colon which - question colitis.  New small right and tiny left pleural effusion, small amount of ascites and increasing subcutaneous edema.  No evidence of discrete abscess or pneumoperitoneum.  Unchanged 3 cm infrarenal suprailiac abdominal aortic aneurysm.   Electronically Signed   By: Harmon Pier M.D.   On: 09/22/2014 15:46    Recent Labs  09/22/14 1025  WBC 10.6*  HGB 10.6*  HCT 33.6*  PLT 270    Recent Labs  09/22/14 1750  NA 131*  K 4.9  CL 95*  GLUCOSE 46*  BUN 42*  CREATININE 6.32*  CALCIUM 8.4*   CBG (last 3)  No results for input(s): GLUCAP in the last 72 hours.  Wt Readings from Last 3 Encounters:  09/24/14 99 kg (218 lb 4.1 oz)  09/10/14 98.975 kg (218 lb 3.2 oz)  06/01/14 102.059 kg (225 lb)    Physical Exam:  Gen: alert, no distress, obese HENT:  Poor dentition,  Eyes: EOM are normal.  Neck: Normal range of motion. Neck supple. No thyromegaly present.  Cardiovascular: Normal rate and regular rhythm. no murmurs Respiratory: Effort normal and breath sounds normal. No respiratory distress. occ rhonchi and upper airway sounds. No cough today GI: Bowel sounds are normal.  Abdominal wound granulation tissue pink. G-tube site clean and  dry , no drainage Skin: left AVG site clean, less tender, minimal edema, 2+ edema RLE, trace edema RLE Neurological: She is alert.  She is oriented to person place date of birth.  LUE 4/5  deltoid, bicep, tricep. 4/5 wrist/hand. RUE grossly 4+/5. LE: 3+ RHF, 3+ LHF, 3+ RKE, 3 LKE, ankles grossly 3+ to 4-/5. Marland Kitchen Intact LT BLE.   cooperative.  Psych: pleasant  , mood affect brighter , smiling  Assessment/Plan: 1. 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 Physiatrist is providing close team supervision and 24 hour management of active medical problems listed below. Physiatrist and rehab team continue to assess barriers to discharge/monitor patient progress toward functional and medical goals.  FIM: FIM - Bathing Bathing Steps Patient Completed: Chest, Right Arm, Left Arm, Abdomen, Front perineal area, Buttocks, Right upper leg, Left upper leg Bathing: 4: Min-Patient completes 8-9 27f 10 parts or 75+ percent  FIM - Upper Body Dressing/Undressing Upper body dressing/undressing steps patient completed: Thread/unthread right sleeve of pullover shirt/dresss, Thread/unthread left sleeve of pullover shirt/dress, Put head through opening of pull over shirt/dress, Pull shirt over trunk Upper body dressing/undressing: 5: Set-up assist to: Obtain clothing/put away FIM - Lower Body Dressing/Undressing Lower body dressing/undressing steps patient completed: Thread/unthread right pants leg, Pull pants up/down, Don/Doff right shoe Lower body dressing/undressing: 1: Total-Patient completed less than 25% of tasks  FIM - Toileting Toileting steps completed by patient: Adjust clothing prior to toileting, Performs perineal hygiene Toileting Assistive Devices: Grab bar or rail for support Toileting: 3: Mod-Patient completed 2 of 3 steps  FIM - Diplomatic Services operational officer Devices: Grab bars, Elevated toilet seat Toilet Transfers: 4-To toilet/BSC: Min A (steadying Pt. > 75%), 4-From toilet/BSC: Min A (steadying Pt. > 75%)  FIM - Landscape architect Devices: Arm rests, Therapist, occupational: 4: Supine > Sit: Min A (steadying Pt. > 75%/lift 1 leg), 3: Sit > Supine: Mod A (lifting assist/Pt. 50-74%/lift 2 legs), 4: Bed > Chair or W/C: Min A (steadying Pt. > 75%), 4: Chair or W/C > Bed: Min A (steadying Pt. > 75%)  FIM - Locomotion: Wheelchair Distance: 50 Locomotion: Wheelchair: 2: Travels 50 - 149 ft with supervision, cueing or coaxing FIM - Locomotion: Ambulation Locomotion: Ambulation Assistive Devices: Designer, industrial/product Ambulation/Gait Assistance: 4: Min guard Locomotion: Ambulation: 1: Travels less than 50 ft with supervision/safety issues  Comprehension Comprehension Mode: Auditory Comprehension: 5-Follows basic conversation/direction: With extra time/assistive device  Expression Expression Mode: Verbal Expression: 5-Expresses basic needs/ideas: With extra time/assistive device  Social Interaction Social Interaction: 4-Interacts appropriately 75 - 89% of the time - Needs redirection for appropriate language or to initiate interaction.  Problem Solving Problem Solving: 5-Solves basic 90% of the time/requires cueing < 10% of the time  Memory Memory: 5-Recognizes or recalls 90% of the time/requires cueing < 10% of the time  Medical Problem List and Plan: 1. 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 status post loop recorder/respiratory failure/multi-medical  -tolerating therapies thus far 2. DVT Prophylaxis/Anticoagulation: SCD.  Venous Doppler studies negative, can use TEDs, no need for repeat Doppler 3. Pain Management: Oxycodone as needed. Fair control at present 4. End-stage renal disease. Left arm AV fistula 09/09/2014. Hemodialysis as per renal services 5. Neuropsych: This patient is capable of making decisions on her own behalf. 6. Skin/Wound Care: Routine skin checks/dressing changes as advised to abdominal wound with wet-to-dry, pitting edema worse  on hemiparetic side TEDS 7.  Fluids/Electrolytes/Nutrition: . Check labs Monday  -continuous TF on board overnight- 8. Hx of C diff: at risk for recurrence, has diarrhea, elevated WBC, recheck 9. Hypertension/intermittent bouts of PSVT. Lopressor 12.5 mg twice a day--rate controlled. 10. Massive pneumoperitoneum and ascites. Status post exploratory laparotomy and closure of perforated pyloric ulcer 07/01/2014.  -maintain dressing changes per Gen surg.Repeat CT shows possible colitis in prox lg colon, pt with dirrhea, await repeat c diff 11. Decreased nutritional storage. Advance diet as tolerated. Status post GJ tube 08/12/2014 per interventional radiology 12. Hypothyroidism. Synthroid 13. Mood/anxiety. Ativan as needed 14. Intermittent cough, "?asthma"---pulmicort neb prn effective. May be anxiety component also, on celexa, monitor 15.  hypoNa- per renal address in HD LOS (Days) 13 A FACE TO FACE EVALUATION WAS PERFORMED  Erick Colace 09/24/2014 7:37 AM

## 2014-09-24 NOTE — Progress Notes (Signed)
Occupational Therapy Session Note  Patient Details  Name: [name] MRN: [id number] Date of Birth: 1955/08/16  Today's Date: 09/24/2014 OT Individual Time: 0950-1100 OT Individual Time Calculation (min): 70 min    Short Term Goals: Week 2:  OT Short Term Goal 1 (Week 2): Pt will perform LB dressing with Mod A in order to decrease level of assistance for self care. OT Short Term Goal 2 (Week 2): Pt will complete toilet transfer with RW at supervision level OT Short Term Goal 3 (Week 2): Pt will complete bathing at sink with supervision at sit > stand level OT Short Term Goal 4 (Week 2): Pt will complete 2/3 toileting tasks with supervision  Skilled Therapeutic Interventions/Progress Updates:    Engaged in therapeutic activity with focus on transfers, sit <> stand, standing balance, and overall activity tolerance.  Performed stand pivot transfer w/c > elevated toilet seat with min assist and use of grab bars for steady assist.  Pt able to complete all 3 toileting tasks with min/steady assist for standing balance.  Grooming tasks completed at sink with increased time.  Engaged in standing task in Dayroom with focus on sit > stand, standing tolerance, and weight shifting in standing.  Pt 86% on 2L O2 at rest, bumped up to 3L with pt maintaining sats in low 90s throughout session.  Pt required mod assist sit > stand from w/c throughout session and max encouragement.  Pt tolerated standing for max of 4 mins x3 requiring prolonged rest breaks after each stand.  Engaged in large puzzle at table top with pt requiring cues for problem solving and orientation of puzzle pieces.  Pt returned to room at end of session and left seated upright in w/c.  Therapy Documentation Precautions:  Precautions Precautions: Fall Precaution Comments:  abdominal binder, abdominal wound, 2-3L 02 to maintain Sp02 >90% Other Brace/Splint: abdominal binder Restrictions Weight Bearing Restrictions: No Pain: Pain  Assessment Pain Assessment: 0-10 Pain Score: 7  Pain Type: Acute pain Pain Location: Leg Pain Orientation: Left Pain Descriptors / Indicators: Aching;Moaning Pain Onset: On-going Patients Stated Pain Goal: 4 Pain Intervention(s): Ambulation/increased activity Multiple Pain Sites: No  See FIM for current functional status  Therapy/Group: Individual Therapy  Rosalio Loud 09/24/2014, 12:30 PM

## 2014-09-24 NOTE — Progress Notes (Signed)
Physical Therapy Session Note  Patient Details  Name: [name] MRN: [id number] Date of Birth: 1955-12-11  Today's Date: 09/24/2014 PT Individual Time: 0800-0900 and  PT Individual Time Calculation (min): 60 min and  Short Term Goals: Week 2:  PT Short Term Goal 1 (Week 2): Pt will perform sit<> stand with supervision from w/c height PT Short Term Goal 2 (Week 2): Pt will perform bed <> chair transfer supervision with LRAD PT Short Term Goal 3 (Week 2): Pt will ambulate x 68' w/ LRAD at S min/guard level with SaO2 in the 90's.  PT Short Term Goal 4 (Week 2): Pt will propel w/c 100' with supervision and min cueing PT Short Term Goal 5 (Week 2): Pt will ascend/descend 8 steps with R rail and minA  Skilled Therapeutic Interventions/Progress Updates:    AM session: Pt received in w/c with c/o pain in LLE as described below; reluctantly agreeable to treatment. Pt instructed in supine > sit, and t/f to w/c with minA. Once in chair pt instructed in upper body dressing with set-up assist. Following transfer and upper body dressing O2 measured at 90% on 3L O@ via [place] and pt requesting breathing treatment due to feeling short of breath; RN notified and performed breathing treatment during rest of therapy session. Pt Instructed in lower body dressing with minA to thread BLEs; maxA for donning shoes, and steadying assist for donning pants in standing. Requires maxA for donning of brief. Pt instructed in seated LE strengthening exercises for overall LE strengthening as well as general activity tolerance; including hip flexion marching, long arc quad, adduction pillow squeeze, and glute sets. Pt instructed in standing tolerance for 2 trials of approximately 3 min with extended seated rest break between trials due to fatigue. Pt remained seated in w/c and all needs within reach at completion of session.   PM session: Pt received in w/c with c/o pain as described below (no change from AM session); reluctantly  agreeable to treatment. Pt instructed in w/c propulsion with BUEs x14ft with slow speed and verbal cues needed for encouragement to continue performance. Pt instructed in stairs ascent/descent with minA/supervision. Discussed with pt the goal of performing 12 stairs total due to necessity to perform 12 at home; allowed pt to decide when rest breaks were needed due to fatigue. Overall pt completed 12 stairs, 3 sets of 4 stairs with rest breaks between each trial of approximately 3-5 min. After first trial O2 measured at 80%; O2 via [place] increased to 3L for remainder of session and sats remained WNL. Pt instructed in Nustep on level 1 x 6 min with occasional rest breaks due to fatigue. Performed with BUEs and BLEs to increase strength and overall endurance and activity tolerance. Pt instructed in gait training for 25' followed by rest break, and second trial of 15'. Performed with RW and supervision for gait, however sit <> stand required modA due to pt fatigue. Pt required motivation and encouragement to participate and perform second trial. Pt educated on need to begin increasing gait distances and activity tolerance to improve strength and prepare for d/c home. Pt returned to room and instructed in upper/lower body dressing to change from clothes into gown in preparation for dialysis. Pt performed stand pivot transfer w/c>bed with supervision, and sit >supine with minA to assist with LLE. Pt remained supine in bed with all needs within reach at completion of session.  Therapy Documentation Precautions:  Precautions Precautions: Fall Precaution Comments:  abdominal binder, abdominal wound,  2-3L 02 to maintain Sp02 >90% Other Brace/Splint: abdominal binder Restrictions Weight Bearing Restrictions: No  Pain: Pain Assessment Pain Assessment: 0-10 Pain Score: 7  Pain Type: Acute pain Pain Location: Leg Pain Orientation: Left Pain Descriptors / Indicators: Aching;Moaning Pain Onset: On-going Patients  Stated Pain Goal: 4 Pain Intervention(s): Ambulation/increased activity Multiple Pain Sites: No  See FIM for current functional status  Therapy/Group: Individual Therapy  [name] 09/24/2014, 12:16 PM

## 2014-09-25 ENCOUNTER — Inpatient Hospital Stay (HOSPITAL_COMMUNITY): Payer: Self-pay | Admitting: Occupational Therapy

## 2014-09-25 ENCOUNTER — Inpatient Hospital Stay (HOSPITAL_COMMUNITY): Payer: Self-pay | Admitting: Physical Therapy

## 2014-09-25 DIAGNOSIS — K551 Chronic vascular disorders of intestine: Secondary | ICD-10-CM

## 2014-09-25 LAB — CLOSTRIDIUM DIFFICILE BY PCR: Toxigenic C. Difficile by PCR: NEGATIVE

## 2014-09-25 MED ORDER — BOOST / RESOURCE BREEZE PO LIQD
1.0000 | Freq: Three times a day (TID) | ORAL | Status: DC
  Administered 2014-09-26 (×2): 1 via ORAL

## 2014-09-25 MED ORDER — NEPRO/CARBSTEADY PO LIQD: 237.0000 mL | Freq: Two times a day (BID) | ORAL | Status: DC

## 2014-09-25 NOTE — Progress Notes (Signed)
Occupational Therapy Note  Patient Details  Name: Svara HAYVEN KOESTLER MRN: [id number] Date of Birth: 06-26-1955  Today's Date: 09/25/2014 OT Missed Time: 60 Minutes Missed Time Reason: Patient ill (comment);Patient fatigue  Pt refusing 60 min scheduled OT treatment session secondary to being up all night with loose stools.  Pt reporting "I just can't keep going like this".  Encouraged pt to participate and explained rehab process with pt reporting just needing to rest as having just returned from bathroom recently.  Therapist returned 30 mins later and pt continuing to refuse due to fatigue and nausea.  Rosalio Loud 09/25/2014, 7:11 AM

## 2014-09-25 NOTE — Progress Notes (Signed)
Subjective:    HD last night- removed 2500- no extreme low BP's but then reports when back to her room diarrhea "about every hour"- unable to participate in therapy this AM  Objective Filed Vitals:   09/24/14 1915 09/24/14 1948 09/25/14 0520 09/25/14 0825  BP: 118/78 105/54 94/61   Pulse: 75 74 83   Temp:  97.4 F (36.3 C) 97.7 F (36.5 C)   TempSrc:  Oral Oral   Resp:  16 16   Height:      Weight:   98.294 kg (216 lb 11.2 oz)   SpO2:  97% 94% 95%   Physical Exam General: alert and oriented, no acute distress.  Heart: RRR Lungs: CTA, unlabored Abdomen: soft, PEG tube. Dressing intact Extremities: +2LE edema bilat up to the hips Dialysis Access:  R IJ catheter/ L AVF +b  Assessment: 1. Pyloric ulcer perf s/p exlap complicated by duod fistula which has healed over- waxing and waning symptoms- seem to be related to HD which is unfortunate-  2. ESRD - new start to HD, on TTS schedule, using R IJ catheter; AVF placed 01/28/14 by Dr. Darrick Penna, s/p revision 6/8 per Dr. Arbie Cookey.  Possibly use early July-  Plan for next HD tomorrow- has been accepted at [facility]- TTS at 6:45 AM upon discharge 3. Hypotension on midodrine- Hx severe RHF by echo- but needs volume off 4. Vol excess - needs more aggressive UF with HD- 2500 was not a huge amount but may have led to worsening GI symptoms- this could be a problem  5. Anemia - Hgb 10.6- now to 9.6, Aranesp 200 mcg on Thurs, started Fe 6/9.-  6. 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 Nutrition - Alb 2.4, soft diet, vitamin. Still had PEG tube 8. C diff colitis - now negative. 9. Anxiety - Xanax pre-HD 10. CVA  Yazen Rosko A   09/25/2014, 10:10 AM    Additional Objective Labs: Basic Metabolic Panel:  Recent Labs Lab 09/19/14 1954 09/22/14 1750 09/24/14 1603  NA 129* 131* 134*  K 3.5 4.9 4.6  CL 93* 95* 99*  CO2 23 22 24    GLUCOSE 97 46* 80  BUN 29* 42* 28*  CREATININE 4.92* 6.32* 5.77*  CALCIUM 8.0* 8.4* 8.5*  PHOS 7.3* 8.6* 6.2*   Liver Function Tests:  Recent Labs Lab 09/19/14 1954 09/22/14 1750 09/24/14 1603  ALBUMIN 2.3* 2.3* 2.3*   No results for input(s): LIPASE, AMYLASE in the last 168 hours. CBC:  Recent Labs Lab 09/19/14 1954 09/21/14 0641 09/22/14 1025 09/24/14 1604  WBC 6.6 16.2* 10.6* 8.2  NEUTROABS  --  12.9* 8.5*  --   HGB 9.6* 10.1* 10.6* 9.6*  HCT 30.4* 32.5* 33.6* 30.5*  MCV 93.3 96.7 94.6 93.6  PLT 275 237 270 262   Blood Culture    Component Value Date/Time   SDES ABSCESS PELVIS 08/17/2014 1451   SPECREQUEST NONE 08/17/2014 1451   CULT  08/17/2014 1451    NO GROWTH 3 DAYS Performed at [facility]    REPTSTATUS 08/21/2014 FINAL 08/17/2014 1451    Cardiac Enzymes: No results for input(s): CKTOTAL, CKMB, CKMBINDEX, TROPONINI in the last 168 hours. CBG: No results for input(s): GLUCAP in the last 168 hours. Iron Studies: No results for input(s): IRON, TIBC, TRANSFERRIN, FERRITIN in the last 72 hours. @lablastinr3 @ Studies/Results: No results found. Medications: . sodium chloride 65  mL/hr at 09/23/14 0702   . ALPRAZolam  0.5 mg Oral Q T,Th,Sa-HD  . amiodarone  400 mg Oral BID  . aspirin  81 mg Oral Daily  . budesonide (PULMICORT) nebulizer solution  0.25 mg Nebulization BID  . calcitRIOL  0.5 mcg Oral Q T,Th,Sat-1800  . carbamide peroxide  5 drop Right Ear BID  . chlorhexidine  15 mL Mouth Rinse BID  . citalopram  20 mg Oral Daily  . darbepoetin (ARANESP) injection - DIALYSIS  200 mcg Intravenous Q Thu-HD  . feeding supplement (NEPRO CARB STEADY)  237 mL Oral Q1500  . feeding supplement (PRO-STAT SUGAR FREE 64)  30 mL Per Tube TID BM  . feeding supplement (RESOURCE BREEZE)  1 Container Oral BID BM  . ipratropium  0.5 mg Nebulization TID  . levalbuterol  0.63 mg Nebulization TID  . levothyroxine  25 mcg Oral QAC breakfast  . midodrine  10 mg  Oral Q1200  . multivitamin  1 tablet Oral QHS  . sevelamer carbonate  2.4 g Oral TID  . vitamin C  250 mg Oral Daily

## 2014-09-25 NOTE — Progress Notes (Signed)
Physical Therapy Session Note  Patient Details  Name: [name] MRN: [id number] Date of Birth: May 30, 1955  Today's Date: 09/25/2014 PT Missed Time: 60 Minutes Missed Time Reason: Patient ill (Comment) (pt refusal d/t nausea, diarrhea)  PT attempted to see pt for second PT session of the day; encouraged pt to participated and suggested low-intensity activity such as sitting EOB, going to restroom. However pt continues to report symptoms of pain/discomfort associated with diarrhea/nausea and refuses treatment at this time. Missed 60 min treatment time.   See FIM for current functional status   [name] 09/25/2014, 12:45 PM

## 2014-09-25 NOTE — Progress Notes (Signed)
Occupational Therapy Weekly Progress Note  Patient Details  Name: [name] MRN: [id number] Date of Birth: Mar 25, 1956  Beginning of progress report period: [place] 17, 2016 End of progress report period: [place] 24, 2016  Patient has met 0 of 4 short term goals.  Pt was making steady progress towards goals prior to setback from severe abdominal pain and soreness.  Pt had weaned from supplemental O2 during treatment sessions but now requires 2-3L O2 at rest and during activity due to decreased cardiorespiratory endurance and overall increased weakness.  Pt had progressed from sit > stand with close supervision from w/c level and min-mod from lower surfaces to mod-max assist from w/c.  Pt with continuing loose stools and soreness in abdomen which are limiting her participation in therapy sessions.  LOS extended to allow for improved activity tolerance based on missed therapy sessions early in week.  Patient continues to demonstrate the following deficits: decreased activity tolerance, muscle weakness, decreased cardiorespiratoy endurance and decreased sitting balance, decreased standing balance, decreased postural control, decreased balance strategies and Lt hemiparesis and therefore will continue to benefit from skilled OT intervention to enhance overall performance with BADL and Reduce care partner burden.  Patient progressing toward long term goals..  Continue plan of care.  OT Short Term Goals Week 2:  OT Short Term Goal 1 (Week 2): Pt will perform LB dressing with Mod A in order to decrease level of assistance for self care. OT Short Term Goal 1 - Progress (Week 2): Progressing toward goal OT Short Term Goal 2 (Week 2): Pt will complete toilet transfer with RW at supervision level OT Short Term Goal 2 - Progress (Week 2): Progressing toward goal OT Short Term Goal 3 (Week 2): Pt will complete bathing at sink with supervision at sit > stand level OT Short Term Goal 3 - Progress (Week 2):  Progressing toward goal OT Short Term Goal 4 (Week 2): Pt will complete 2/3 toileting tasks with supervision OT Short Term Goal 4 - Progress (Week 2): Progressing toward goal Week 3:  OT Short Term Goal 1 (Week 3): Pt will perform LB dressing with Mod A in order to decrease level of assistance for self care. OT Short Term Goal 2 (Week 3): Pt will complete toilet transfer with RW at supervision level OT Short Term Goal 3 (Week 3): Pt will complete bathing at sink with supervision at sit > stand level OT Short Term Goal 4 (Week 3): Pt will complete 3/3 toileting tasks with supervision  Skilled Therapeutic Interventions/Progress Updates:      Therapy Documentation Precautions:  Precautions Precautions: Fall Precaution Comments:  abdominal binder, abdominal wound, 2-3L 02 to maintain Sp02 >90% Other Brace/Splint: abdominal binder Restrictions Weight Bearing Restrictions: No General:   Vital Signs: Therapy Vitals Temp: 97.7 F (36.5 C) Temp Source: Oral Pulse Rate: 83 Resp: 16 BP: 94/61 mmHg Patient Position (if appropriate): Lying Oxygen Therapy SpO2: 94 % O2 Device: Nasal Cannula O2 Flow Rate (L/min): 2 L/min Pain:  Pt with discomfort in abdomen, RN aware.   Simonne Come 09/25/2014, 7:14 AM

## 2014-09-25 NOTE — Progress Notes (Addendum)
Nutrition Follow-up  DOCUMENTATION CODES:  Obesity unspecified  INTERVENTION:  Provide Resource Breeze po TID, each supplement provides 250 kcal and 9 grams of protein.  Discontinue Nepro Shake.  Continue 30 ml Prostat per tube TID, each supplement provides 100 kcal and 15 grams of protein.   Provide nourishment snacks between meals.  Encourage adequate PO intake.   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 Presented 06/17/2014 with progressive SOB 2 weeks and lower extremity edema. 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 CT scan showed massive pneumoperitoneum and ascites and also pneumatosis of a short segment of the ileum. Pt underwent exploratory laparotomy evacuation of ascites with closure of perforated pyloric ulcer with omental patch and segmental resection of ileum 07/01/2014. G-J tube placed 5/11.  Pt has been having diarrhea. TF has been discontinued to investigate what is causing the diarrhea. Of note, V[employer] AF formula is a specialty formula that helps promote GI tolerance. Pt reports having a lack of appetite. Meal completion has been poor with 0-15%. Pt has been refusing most of her oral supplements as she reports having abdominal pain. Recommend re-starting tube feeding once diarrhea is under control. Pt was educated the importance  of nutrition and encouraged to eat her food at meals and to drink her supplements. Pt expressed understanding. RD to continue to monitor closely.   RD to discontinue Nepro Shake as per RN pt has been refusing as it causing abdominal discomfort.  Labs and medications reviewed.  Height:  Ht Readings from Last 1 Encounters:  09/11/14 _0  (1.702 m)    Weight:  Wt Readings from Last 1 Encounters:  09/25/14 216 lb 11.2 oz (98.294 kg)    Ideal Body Weight:  61 kg  Wt Readings from Last 10 Encounters:  09/25/14 216 lb 11.2 oz (98.294 kg)  09/10/14 218 lb 3.2 oz (98.975 kg)  06/01/14 225 lb (102.059 kg)  01/28/14 230 lb (104.327 kg)  01/21/14 231 lb 1.6 oz (104.826 kg)  10/02/12 216 lb 8 oz (98.204 kg)    BMI:  Body mass index is 33.93 kg/(m^2).  Estimated Nutritional Needs:  Kcal:  2000-2200  Protein:  110-125 grams  Fluid:  Per MD  Skin:   Incision on abdomen, L arm, wound on abdomen, non-pitting UE edema, +1 LE edema  Diet Order:  DIET SOFT Room service appropriate?: Yes; Fluid consistency:: Thin  EDUCATION NEEDS:  No education needs identified at this time   Intake/Output Summary (Last 24 hours) at 09/25/14 1509 Last data filed at 09/24/14 2111  Gross per 24 hour  Intake     20 ml  Output   2500 ml  Net  -2480 ml    Last BM:  6/23  Corrin Parker, MS, RD, LDN Pager # [phone] After hours/ weekend pager # [phone]

## 2014-09-25 NOTE — Progress Notes (Signed)
Occupational Therapy Note  Patient Details  Name: Janesia MALEAHA OBRYANT MRN: [id number] Date of Birth: 1956/02/10  Today's Date: 09/25/2014 OT Missed Time: 30 Minutes Missed Time Reason: Patient ill (comment);Patient fatigue  Pt missed 30 mins scheduled OT treatment session secondary to breathing treatment and still not feeling well.  Encouraged pt to participate in simple self-care tasks or even toileting, however pt continued to refuse.    Rosalio Loud 09/25/2014, 2:20 PM

## 2014-09-25 NOTE — Progress Notes (Signed)
[place] PHYSICAL MEDICINE & REHABILITATION     PROGRESS NOTE   CC Nausea Subjective/Complaints: No issues overnite except diarrhea, liquid stools, also with nausea, poor appetite yesterday and today , no abd pain Per RN Diarrhea started after TF   ROS: Pt denies , abdominal pain,SOB, chest pain,, anxiety, or depression, although the pt appears anxious when discussing her diarrhea   Objective: Vital Signs: Blood pressure 94/61, pulse 83, temperature 97.7 F (36.5 C), temperature source Oral, resp. rate 16, height  (1.702 m), weight 98.294 kg (216 lb 11.2 oz), SpO2 94 %. No results found.  Recent Labs  09/22/14 1025 09/24/14 1604  WBC 10.6* 8.2  HGB 10.6* 9.6*  HCT 33.6* 30.5*  PLT 270 262    Recent Labs  09/22/14 1750 09/24/14 1603  NA 131* 134*  K 4.9 4.6  CL 95* 99*  GLUCOSE 46* 80  BUN 42* 28*  CREATININE 6.32* 5.77*  CALCIUM 8.4* 8.5*   CBG (last 3)  No results for input(s): GLUCAP in the last 72 hours.  Wt Readings from Last 3 Encounters:  09/25/14 98.294 kg (216 lb 11.2 oz)  09/10/14 98.975 kg (218 lb 3.2 oz)  06/01/14 102.059 kg (225 lb)    Physical Exam:  Gen: alert, no distress, obese HENT:  Poor dentition,  Eyes: EOM are normal.  Neck: Normal range of motion. Neck supple. No thyromegaly present.  Cardiovascular: Normal rate and regular rhythm. no murmurs Respiratory: Effort normal and breath sounds normal. No respiratory distress. occ rhonchi and upper airway sounds. No cough today GI: Bowel sounds are normal.  Abdominal wound granulation tissue pink. G-tube site clean and dry , no drainage Skin: l, 2+ edema RLE, trace edema RLE Neurological: She is alert.  She is oriented to person place date of birth.  LUE 4/5 deltoid, bicep, tricep. 4/5 wrist/hand. RUE grossly 4+/5. LE: 3+ RHF, 3+ LHF, 3+ RKE, 3 LKE, ankles grossly 3+ to 4-/5. Marland Kitchen Intact LT BLE.   cooperative.  Psych: pleasant  , mood affect depressed  Assessment/Plan: 1.  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 Physiatrist is providing close team supervision and 24 hour management of active medical problems listed below. Physiatrist and rehab team continue to assess barriers to discharge/monitor patient progress toward functional and medical goals.  Diarrhea and nausea, hx of ischemic colitis which is likely the etiology of diarrhea, check amylase and lipase, consider GI consult for chronic management, hold TF for tonite but concerns regarding poor po intake FIM: FIM - Bathing Bathing Steps Patient Completed: Chest, Right Arm, Left Arm, Abdomen, Front perineal area, Buttocks, Right upper leg, Left upper leg Bathing: 4: Min-Patient completes 8-9 39f 10 parts or 75+ percent  FIM - Upper Body Dressing/Undressing Upper body dressing/undressing steps patient completed: Thread/unthread right sleeve of pullover shirt/dresss, Thread/unthread left sleeve of pullover shirt/dress, Put head through opening of pull over shirt/dress, Pull shirt over trunk Upper body dressing/undressing: 5: Set-up assist to: Obtain clothing/put away FIM - Lower Body Dressing/Undressing Lower body dressing/undressing steps patient completed: Thread/unthread right pants leg, Pull pants up/down, Don/Doff right shoe Lower body dressing/undressing: 1: Total-Patient completed less than 25% of tasks  FIM - Toileting Toileting steps completed by patient: Adjust clothing prior to toileting, Performs perineal hygiene, Adjust clothing after toileting Toileting Assistive Devices: Grab bar or rail for support Toileting: 4: Steadying assist  FIM - Diplomatic Services operational officer Devices:  Grab bars, Elevated toilet seat Toilet Transfers: 4-To toilet/BSC: Min A (steadying Pt. > 75%), 4-From toilet/BSC: Min A (steadying Pt. > 75%)  FIM - Landscape architect Devices: Arm rests, Therapist, occupational: 4: Supine > Sit: Min A (steadying Pt. > 75%/lift 1 leg), 4: Sit > Supine: Min A (steadying pt. > 75%/lift 1 leg), 5: Bed > Chair or W/C: Supervision (verbal cues/safety issues), 5: Chair or W/C > Bed: Supervision (verbal cues/safety issues)  FIM - Locomotion: Wheelchair Distance: 40 Locomotion: Wheelchair: 1: Travels less than 50 ft with supervision, cueing or coaxing FIM - Locomotion: Ambulation Locomotion: Ambulation Assistive Devices: Designer, industrial/product Ambulation/Gait Assistance: 4: Min guard Locomotion: Ambulation: 1: Travels less than 50 ft with supervision/safety issues  Comprehension Comprehension Mode: Auditory Comprehension: 5-Follows basic conversation/direction: With extra time/assistive device  Expression Expression Mode: Verbal Expression: 5-Expresses basic needs/ideas: With extra time/assistive device  Social Interaction Social Interaction: 4-Interacts appropriately 75 - 89% of the time - Needs redirection for appropriate language or to initiate interaction.  Problem Solving Problem Solving: 5-Solves basic 90% of the time/requires cueing < 10% of the time  Memory Memory: 5-Recognizes or recalls 90% of the time/requires cueing < 10% of the time  Medical Problem List and Plan: 1. 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 status post loop recorder/respiratory failure/multi-medical  -tolerating therapies thus far 2. DVT Prophylaxis/Anticoagulation: SCD.  Venous Doppler studies negative, can use TEDs, no need for repeat Doppler 3. Pain Management: Oxycodone as needed. Fair control at present 4. End-stage renal disease. Left arm AV fistula 09/09/2014. Hemodialysis as per renal services 5. Neuropsych: This patient is capable of making decisions on her own behalf. 6. Skin/Wound Care: Routine skin checks/dressing changes as advised to abdominal wound with wet-to-dry, pitting edema worse on hemiparetic side TEDS  7. Fluids/Electrolytes/Nutrition: . Check labs Monday  -continuous TF on board overnight- 8. Hx of C diff: at risk for recurrence, has diarrhea, elevated WBC, recheck 9. Hypertension/intermittent bouts of PSVT. Lopressor 12.5 mg twice a day--rate controlled. 10. Massive pneumoperitoneum and ascites. Status post exploratory laparotomy and closure of perforated pyloric ulcer 07/01/2014.  -maintain dressing changes per Gen surg.Repeat CT shows possible colitis in prox lg colon, pt with dirrhea,severe C diff neg 11. Decreased nutritional storage. Advance diet as tolerated. Status post GJ tube 08/12/2014 per interventional radiology 12. Hypothyroidism. Synthroid 13. Mood/anxiety. Ativan as needed 14. Intermittent cough, "?asthma"---pulmicort neb prn effective. May be anxiety component also, on celexa, monitor 15.  hypoNa- per renal address in HD LOS (Days) 14 A FACE TO FACE EVALUATION WAS PERFORMED  Omero Kowal E 09/25/2014 8:06 AM

## 2014-09-25 NOTE — Progress Notes (Signed)
Physical Therapy Session Note  Patient Details  Name: [name] MRN: [id number] Date of Birth: [birth year]  Today's Date: 09/25/2014 PT Missed Time: 60 Minutes Missed Time Reason: Patient ill (Comment) (Pt refusal due to diarrhea/nausea)   See FIM for current functional status   [name] 09/25/2014, 8:50 AM

## 2014-09-25 NOTE — Plan of Care (Signed)
Problem: RH Dressing Goal: LTG Patient will perform lower body dressing w/assist (OT) LTG: Patient will perform lower body dressing with assist, with/without cues in positioning using equipment (OT)  Downgraded due to slow progress.  Problem: RH Tub/Shower Transfers Goal: LTG Patient will perform tub/shower transfers w/assist (OT) LTG: Patient will perform tub/shower transfers with assist, with/without cues using equipment (OT)  Outcome: Not Applicable Date Met:  81/10/31 D/C due to not medically ready for shower due to stomach wound and decreased cardiorespiratory endurance and therefore has not been a focus of sessions.

## 2014-09-26 ENCOUNTER — Inpatient Hospital Stay (HOSPITAL_COMMUNITY): Payer: Medicaid Other | Admitting: Physical Therapy

## 2014-09-26 ENCOUNTER — Encounter (HOSPITAL_COMMUNITY): Payer: Self-pay | Admitting: Occupational Therapy

## 2014-09-26 ENCOUNTER — Inpatient Hospital Stay (HOSPITAL_COMMUNITY): Payer: Self-pay | Admitting: Occupational Therapy

## 2014-09-26 LAB — RENAL FUNCTION PANEL
Albumin: 2.2 g/dL — ABNORMAL LOW (ref 3.5–5.0)
Anion gap: 29 — ABNORMAL HIGH (ref 5–15)
BUN: 24 mg/dL — ABNORMAL HIGH (ref 6–20)
CHLORIDE: 88 mmol/L — AB (ref 101–111)
CO2: 26 mmol/L (ref 22–32)
Calcium: 8.2 mg/dL — ABNORMAL LOW (ref 8.9–10.3)
Creatinine, Ser: 5.09 mg/dL — ABNORMAL HIGH (ref 0.44–1.00)
GFR calc non Af Amer: 9 mL/min — ABNORMAL LOW (ref >60)
GFR, EST AFRICAN AMERICAN: 10 mL/min — AB (ref >60)
Glucose, Bld: 94 mg/dL (ref 65–99)
Phosphorus: 5.4 mg/dL — ABNORMAL HIGH (ref 2.5–4.6)
Potassium: 4.3 mmol/L (ref 3.5–5.1)
Sodium: 143 mmol/L (ref 135–145)

## 2014-09-26 LAB — CBC
HEMATOCRIT: 31.3 % — AB (ref 36.0–46.0)
HEMOGLOBIN: 9.8 g/dL — AB (ref 12.0–15.0)
MCH: 29.6 pg (ref 26.0–34.0)
MCHC: 31.3 g/dL (ref 30.0–36.0)
MCV: 94.6 fL (ref 78.0–100.0)
Platelets: 218 10*3/uL (ref 150–400)
RBC: 3.31 MIL/uL — ABNORMAL LOW (ref 3.87–5.11)
RDW: 18.5 % — ABNORMAL HIGH (ref 11.5–15.5)
WBC: 7.6 10*3/uL (ref 4.0–10.5)

## 2014-09-26 MED ORDER — MEGESTROL ACETATE 400 MG/10ML PO SUSP
400.0000 mg | Freq: Two times a day (BID) | ORAL | Status: DC
  Administered 2014-09-26 – 2014-10-02 (×12): 400 mg
  Filled 2014-09-26 (×15): qty 10

## 2014-09-26 MED ORDER — PENTAFLUOROPROP-TETRAFLUOROETH EX AERO: 1.0000 "application " | INHALATION_SPRAY | CUTANEOUS | Status: DC | PRN

## 2014-09-26 MED ORDER — ALTEPLASE 2 MG IJ SOLR
2.0000 mg | Freq: Once | INTRAMUSCULAR | Status: DC | PRN
  Filled 2014-09-26: qty 2

## 2014-09-26 MED ORDER — NEPRO/CARBSTEADY PO LIQD
237.0000 mL | ORAL | Status: DC | PRN
  Filled 2014-09-26: qty 237

## 2014-09-26 MED ORDER — SODIUM CHLORIDE 0.9 % IV SOLN: 100.0000 mL | INTRAVENOUS | Status: DC | PRN

## 2014-09-26 MED ORDER — LIDOCAINE-PRILOCAINE 2.5-2.5 % EX CREA
1.0000 "application " | TOPICAL_CREAM | CUTANEOUS | Status: DC | PRN
  Filled 2014-09-26: qty 5

## 2014-09-26 MED ORDER — LIDOCAINE HCL (PF) 1 % IJ SOLN: 5.0000 mL | INTRAMUSCULAR | Status: DC | PRN

## 2014-09-26 MED ORDER — HEPARIN SODIUM (PORCINE) 1000 UNIT/ML DIALYSIS: 1000.0000 [IU] | INTRAMUSCULAR | Status: DC | PRN

## 2014-09-26 MED ORDER — HEPARIN SODIUM (PORCINE) 1000 UNIT/ML DIALYSIS: 20.0000 [IU]/kg | INTRAMUSCULAR | Status: DC | PRN

## 2014-09-26 NOTE — Progress Notes (Signed)
Physical Therapy Session Note  Patient Details  Name: [name] MRN: [id number] Date of Birth: [birth year]  Today's Date: 09/26/2014 PT Individual Time: 0900-1030 PT Individual Time Calculation (min): 90 min   Short Term Goals: Week 1:  PT Short Term Goal 1 (Week 1): Pt will perform sit<>stand with UE support at min A level  PT Short Term Goal 1 - Progress (Week 1): Met PT Short Term Goal 2 (Week 1): Pt will perform stand pivot transfers with LRAD at min A level  PT Short Term Goal 2 - Progress (Week 1): Met PT Short Term Goal 3 (Week 1): Pt will tolerate dynamic standing task for 2 mins with SaO2 remaining in the 90's.  PT Short Term Goal 3 - Progress (Week 1): Met PT Short Term Goal 4 (Week 1): Pt will ambulate x 21' w/ LRAD at S min/guard level with SaO2 in the 90's.   PT Short Term Goal 4 - Progress (Week 1): Not met  Skilled Therapeutic Interventions/Progress Updates:  Pt was seen bedside in the am sitting up in w/c. Pt propelled w/c about 100 feet with B UEs and S with multiple rest breaks. Pt performed multiple sit to stand during treatment session with rolling walker and min to mod A and verbal cues for proper technique. Pt ambulated 20 feet x 2 with rolling walker and min guard to min A. Pt rode Nu-step 5 minutes x 2 at level 2. Pt performed sit to stand transfers 3 sets x 5 reps each with rolling walker and min A with verbal cues for proper technique. Pt propelled w/c about 75 feet with B UEs and S. Pt returned to room and left sitting up in w/c with call bell within reach. O2 sat greater than 90% throughout treatment on 2 liters.   Therapy Documentation Precautions:  Precautions Precautions: Fall Precaution Comments:  abdominal binder, abdominal wound, 2-3L 02 to maintain Sp02 >90% Other Brace/Splint: abdominal binder Restrictions Weight Bearing Restrictions: No General:   Vital Signs: Oxygen Therapy O2 Device: Nasal Cannula O2 Flow Rate (L/min): 2 L/min Pain: No  c/o pain.    Locomotion : Ambulation Ambulation/Gait Assistance: 4: Min guard;4: Min assist   See FIM for current functional status  Therapy/Group: Individual Therapy  Dub Amis 09/26/2014, 12:30 PM

## 2014-09-26 NOTE — Progress Notes (Signed)
Occupational Therapy Session Note  Patient Details  Name: [name] MRN: [id number] Date of Birth: March 28, 1956  Today's Date: 09/26/2014 OT Individual Time: 7680-8811 and 1116- 1201 OT Individual Time Calculation (min): 41 min and 45 min  19 missed minutes in session 1  Short Term Goals: Week 3:  OT Short Term Goal 1 (Week 3): Pt will perform LB dressing with Mod A in order to decrease level of assistance for self care. OT Short Term Goal 2 (Week 3): Pt will complete toilet transfer with RW at supervision level OT Short Term Goal 3 (Week 3): Pt will complete bathing at sink with supervision at sit > stand level OT Short Term Goal 4 (Week 3): Pt will complete 3/3 toileting tasks with supervision  Skilled Therapeutic Interventions/Progress Updates:  Session1 :Upon entering the room, pt supine in bed with 8/10 c/o pain in abdomen. Pt states, "It just hurts from being so sick yesterday. I am so tired." Pt agreeable to participate in OT intervention with coaxing. Pt performed supine >sit with Min A. Stand pivot transfer with Min A from bed > wheelchair. PT declining toileting this session and requesting to wash at sink. Pt engaged in grooming from wheelchair level with supervision. Min A STS from wheelchair to wash buttocks and peri area with min A standing balance. Pt declining to don clothing and instead donning hospital gown after washing. Pt required multiple rest breaks secondary to fatigue and missed 19 minutes secondary to refusal to continue session secondary to fatigue. Breakfast tray in front of pt with call bell and all needed items within reach.  Session 2: Upon entering the room, pt seated in wheelchair with c/o R ear aching. RN aware and providing drops. Pt agreeable to participate in OT intervention this session with focus on therapeutic exercise. OT educated and demonstrated B UE strengthening exercises with use of level 2 theraband. Pt returning demonstration for chest pulls and  alternating punches 3 sets of 10. Pt then perform AROM knee ext/flex, ankle pumps, and leg lifts for B LEs from seated position x 3 sets of 10. Pt requiring frequent rest breaks secondary to fatigue with O2 via [place] on and saturation WNL during session. Pt remained seated in wheelchair with call bell and all needed items within reach.  Therapy Documentation Precautions:  Precautions Precautions: Fall Precaution Comments:  abdominal binder, abdominal wound, 2-3L 02 to maintain Sp02 >90% Other Brace/Splint: abdominal binder Restrictions Weight Bearing Restrictions: No General: General OT Amount of Missed Time: 19 Minutes Vital Signs: Therapy Vitals Temp: 98.3 F (36.8 C) Temp Source: Oral Pulse Rate: 76 Resp: 18 BP: 110/68 mmHg Patient Position (if appropriate): Lying Oxygen Therapy SpO2: 96 % O2 Device: Nasal Cannula O2 Flow Rate (L/min): 2 L/min  See FIM for current functional status  Therapy/Group: Individual Therapy  Lowella Grip 09/26/2014, 8:58 AM

## 2014-09-26 NOTE — Progress Notes (Signed)
[place] PHYSICAL MEDICINE & REHABILITATION     PROGRESS NOTE   CC Nausea Subjective/Complaints: Diarrhea improved, held TF yesterday.  Poor po intake, discussed the dilemma with pt, still with abd bloating   ROS: Pt denies , abdominal pain,SOB, chest pain,, anxiety, or depression, although the pt appears anxious when discussing her diarrhea   Objective: Vital Signs: Blood pressure 110/68, pulse 76, temperature 98.3 F (36.8 C), temperature source Oral, resp. rate 18, height 5\' 7"  (1.702 m), weight 99.3 kg (218 lb 14.7 oz), SpO2 96 %. No results found.  Recent Labs  09/24/14 1604  WBC 8.2  HGB 9.6*  HCT 30.5*  PLT 262    Recent Labs  09/24/14 1603  NA 134*  K 4.6  CL 99*  GLUCOSE 80  BUN 28*  CREATININE 5.77*  CALCIUM 8.5*   CBG (last 3)  No results for input(s): GLUCAP in the last 72 hours.  Wt Readings from Last 3 Encounters:  09/26/14 99.3 kg (218 lb 14.7 oz)  09/10/14 98.975 kg (218 lb 3.2 oz)  06/01/14 102.059 kg (225 lb)    Physical Exam:  Gen: alert, no distress, obese HENT:  Poor dentition,  Eyes: EOM are normal.  Neck: Normal range of motion. Neck supple. No thyromegaly present.  Cardiovascular: Normal rate and regular rhythm. no murmurs Respiratory: Effort normal and breath sounds normal. No respiratory distress. occ rhonchi and upper airway sounds. No cough today GI: Bowel sounds are normal.  Abdominal wound granulation tissue pink. G-tube site clean and dry , no drainage Skin: l, 2+ edema RLE, trace edema RLE Neurological: She is alert.  She is oriented to person place date of birth.  LUE 4/5 deltoid, bicep, tricep. 4/5 wrist/hand. RUE grossly 4+/5. LE: 3+ RHF, 3+ LHF, 3+ RKE, 3 LKE, ankles grossly 3+ to 4-/5. Marland Kitchen Intact LT BLE.   cooperative.  Psych: pleasant  , mood affect depressed  Assessment/Plan: 1. 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 which require 3+ hours per day of interdisciplinary therapy in a comprehensive  inpatient rehab setting. Physiatrist is providing close team supervision and 24 hour management of active medical problems listed below. Physiatrist and rehab team continue to assess barriers to discharge/monitor patient progress toward functional and medical goals.  Diarrhea and nausea, hx of ischemic colitis which is likely the etiology of diarrhea,, consider GI consult for chronic management, hold TF for tonite but concerns regarding poor po intake, add megace VT to boost appetite FIM: FIM - Bathing Bathing Steps Patient Completed: Chest, Right Arm, Left Arm, Abdomen, Front perineal area, Buttocks, Right upper leg, Left upper leg Bathing: 4: Min-Patient completes 8-9 67f 10 parts or 75+ percent  FIM - Upper Body Dressing/Undressing Upper body dressing/undressing steps patient completed: Thread/unthread right sleeve of pullover shirt/dresss, Thread/unthread left sleeve of pullover shirt/dress, Put head through opening of pull over shirt/dress, Pull shirt over trunk Upper body dressing/undressing: 0: Wears gown/pajamas-no public clothing FIM - Lower Body Dressing/Undressing Lower body dressing/undressing steps patient completed: Thread/unthread right pants leg, Pull pants up/down, Don/Doff right shoe Lower body dressing/undressing: 0: Wears gown/pajamas-no public clothing  FIM - Toileting Toileting steps completed by patient: Adjust clothing prior to toileting, Performs perineal hygiene, Adjust clothing after toileting Toileting Assistive Devices: Grab bar or rail for support Toileting: 4: Steadying assist  FIM - Diplomatic Services operational officer Devices: Grab bars, Elevated toilet seat Toilet Transfers: 4-To toilet/BSC: Min A (steadying Pt. > 75%), 4-From toilet/BSC: Min A (steadying Pt. > 75%)  FIM -  Bed/Chair Transport planner Devices: Arm rests Bed/Chair Transfer: 4: Supine > Sit: Min A (steadying Pt. > 75%/lift 1 leg), 4: Bed > Chair or W/C: Min A  (steadying Pt. > 75%)  FIM - Locomotion: Wheelchair Distance: 40 Locomotion: Wheelchair: 1: Travels less than 50 ft with supervision, cueing or coaxing FIM - Locomotion: Ambulation Locomotion: Ambulation Assistive Devices: Designer, industrial/product Ambulation/Gait Assistance: 4: Min guard Locomotion: Ambulation: 1: Travels less than 50 ft with supervision/safety issues  Comprehension Comprehension Mode: Auditory Comprehension: 5-Follows basic conversation/direction: With extra time/assistive device  Expression Expression Mode: Verbal Expression: 5-Expresses basic needs/ideas: With extra time/assistive device  Social Interaction Social Interaction: 4-Interacts appropriately 75 - 89% of the time - Needs redirection for appropriate language or to initiate interaction.  Problem Solving Problem Solving: 5-Solves basic 90% of the time/requires cueing < 10% of the time  Memory Memory: 5-Recognizes or recalls 90% of the time/requires cueing < 10% of the time  Medical Problem List and Plan: 1. 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 status post loop recorder/respiratory failure/multi-medical  -tolerating therapies thus far 2. DVT Prophylaxis/Anticoagulation: SCD.  Venous Doppler studies negative, can use TEDs, no need for repeat Doppler 3. Pain Management: Oxycodone as needed. Fair control at present 4. End-stage renal disease. Left arm AV fistula 09/09/2014. Hemodialysis as per renal services 5. Neuropsych: This patient is capable of making decisions on her own behalf. 6. Skin/Wound Care: Routine skin checks/dressing abdominal wound with wet-to-dry, pitting edema worse on hemiparetic side TEDS 7. Fluids/Electrolytes/Nutrition: . Check labs Monday  -continuous TF on board overnight- 8. Hx of C diff: at risk for recurrence, has diarrhea, elevated WBC, recheck 9. Hypertension/intermittent bouts of PSVT. Lopressor 12.5 mg twice a day--rate controlled. 10. Chronic Ischemic colitis, thper  Gen surg this is most likely etiology of diarrhea, abd discomfort and low BPs during HD may contribute11. Decreased nutritional storage. Advance diet as tolerated. Status post GJ tube 08/12/2014 per interventional radiology 12. Hypothyroidism. Synthroid 13. Mood/anxiety. Ativan as needed 14. Intermittent cough, "?asthma"---pulmicort neb prn effective. May be anxiety component also, on celexa, monitor 15.  hypoNa- per renal address in HD LOS (Days) 15 A FACE TO FACE EVALUATION WAS PERFORMED  KIRSTEINS,ANDREW E 09/26/2014 9:04 AM

## 2014-09-26 NOTE — Progress Notes (Signed)
Subjective:    Diarrhea is better this AM- abd just a little sore   Objective Filed Vitals:   09/25/14 0520 09/25/14 0825 09/25/14 1400 09/26/14 0533  BP: 94/61  107/70 110/68  Pulse: 83  78 76  Temp: 97.7 F (36.5 C)  98.2 F (36.8 C) 98.3 F (36.8 C)  TempSrc: Oral  Axillary Oral  Resp: Height:      Weight: 98.294 kg (216 lb 11.2 oz)   99.3 kg (218 lb 14.7 oz)  SpO2: 94% 95% 100% 98%   Physical Exam General: alert and oriented, no acute distress.  Heart: RRR Lungs: CTA, unlabored Abdomen: soft, PEG tube. Dressing intact Extremities: +2LE edema bilat up to the hips Dialysis Access:  R IJ catheter/ L AVF +b  Assessment: 1. Pyloric ulcer perf s/p exlap complicated by duod fistula which has healed over- waxing and waning symptoms- seem to be related to HD which is unfortunate-  2. ESRD - new start to HD, on TTS schedule, using R IJ catheter; AVF placed 01/28/14 by Dr. Darrick Penna, s/p revision 6/8 per Dr. Arbie Cookey.  Possibly use early July-  Plan for next HD today- has been accepted at [facility]- TTS at 6:45 AM upon discharge 3. Hypotension on midodrine- Hx severe RHF by echo- but needs volume off 4. Vol excess - needs more aggressive UF with HD- 2500 was not a huge amount but may have led to worsening GI symptoms- this could be a problem - more HD today 5. Anemia - Hgb 10.6- now to 9.6, Aranesp 200 mcg on Thurs, started Fe 6/9.-  6. 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 Nutrition - Alb 2.4, soft diet, vitamin. Still had PEG tube 8. C diff colitis - now negative. 9. Anxiety - Xanax pre-HD 10. CVA  [name] A   09/26/2014, 8:21 AM    Additional Objective Labs: Basic Metabolic Panel:  Recent Labs Lab 09/19/14 1954 09/22/14 1750 09/24/14 1603  NA 129* 131* 134*  K 3.5 4.9 4.6  CL 93* 95* 99*  CO2 GLUCOSE 97 46* 80  BUN 29* 42* 28*   CREATININE 4.92* 6.32* 5.77*  CALCIUM 8.0* 8.4* 8.5*  PHOS 7.3* 8.6* 6.2*   Liver Function Tests:  Recent Labs Lab 09/19/14 1954 09/22/14 1750 09/24/14 1603  ALBUMIN 2.3* 2.3* 2.3*   No results for input(s): LIPASE, AMYLASE in the last 168 hours. CBC:  Recent Labs Lab 09/19/14 1954 09/21/14 0641 09/22/14 1025 09/24/14 1604  WBC 6.6 16.2* 10.6* 8.2  NEUTROABS  --  12.9* 8.5*  --   HGB 9.6* 10.1* 10.6* 9.6*  HCT 30.4* 32.5* 33.6* 30.5*  MCV 93.3 96.7 94.6 93.6  PLT 275 237 270 262   Blood Culture    Component Value Date/Time   SDES ABSCESS PELVIS 08/17/2014 1451   SPECREQUEST NONE 08/17/2014 1451   CULT  08/17/2014 1451    NO GROWTH 3 DAYS Performed at [facility]    REPTSTATUS 08/21/2014 FINAL 08/17/2014 1451    Cardiac Enzymes: No results for input(s): CKTOTAL, CKMB, CKMBINDEX, TROPONINI in the last 168 hours. CBG: No results for input(s): GLUCAP in the last 168 hours. Iron Studies: No results for input(s): IRON, TIBC, TRANSFERRIN, FERRITIN in the last 72 hours. @ Studies/Results: No results found. Medications:   . ALPRAZolam  0.5 mg Oral Q T,Th,Sa-HD  .  amiodarone  400 mg Oral BID  . aspirin  81 mg Oral Daily  . budesonide (PULMICORT) nebulizer solution  0.25 mg Nebulization BID  . calcitRIOL  0.5 mcg Oral Q T,Th,Sat-1800  . chlorhexidine  15 mL Mouth Rinse BID  . citalopram  20 mg Oral Daily  . darbepoetin (ARANESP) injection - DIALYSIS  200 mcg Intravenous Q Thu-HD  . feeding supplement (PRO-STAT SUGAR FREE 64)  30 mL Per Tube TID BM  . feeding supplement (RESOURCE BREEZE)  1 Container Oral TID BM  . ipratropium  0.5 mg Nebulization TID  . levalbuterol  0.63 mg Nebulization TID  . levothyroxine  25 mcg Oral QAC breakfast  . midodrine  10 mg Oral Q1200  . multivitamin  1 tablet Oral QHS  . sevelamer carbonate  2.4 g Oral TID  . vitamin C  250 mg Oral Daily

## 2014-09-27 ENCOUNTER — Inpatient Hospital Stay (HOSPITAL_COMMUNITY): Payer: Self-pay | Admitting: Rehabilitation

## 2014-09-27 MED ORDER — MIDODRINE HCL 5 MG PO TABS
10.0000 mg | ORAL_TABLET | Freq: Every day | ORAL | Status: DC
  Administered 2014-09-28 – 2014-09-30 (×3): 10 mg via ORAL
  Filled 2014-09-27 (×4): qty 2

## 2014-09-27 MED ORDER — MIDODRINE HCL 5 MG PO TABS
10.0000 mg | ORAL_TABLET | ORAL | Status: DC
  Administered 2014-09-29: 10 mg via ORAL
  Filled 2014-09-27: qty 2

## 2014-09-27 MED ORDER — GLUCERNA SHAKE PO LIQD
237.0000 mL | ORAL | Status: DC
  Administered 2014-09-27: 237 mL via JEJUNOSTOMY

## 2014-09-27 MED ORDER — MIDODRINE HCL 5 MG PO TABS
10.0000 mg | ORAL_TABLET | Freq: Once | ORAL | Status: AC
  Administered 2014-09-27: 10 mg via ORAL
  Filled 2014-09-27: qty 2

## 2014-09-27 MED ORDER — SODIUM CHLORIDE 0.9 % IV SOLN
125.0000 mg | Freq: Once | INTRAVENOUS | Status: DC
  Filled 2014-09-27: qty 10

## 2014-09-27 NOTE — Progress Notes (Signed)
[facility] Progress Note  Assessment/Plan: 1. Pyloric ulcer perf s/p exlap complicated by duod fistula which has healed over- waxing and waning symptoms- but not worse today after HD!\ 2. ESRD - new start to HD, on TTS schedule, using R IJ catheter; AVF placed 01/28/14 by Dr. Darrick Penna, s/p revision 6/8 per Dr. Arbie Cookey. Possibly use early July- Plan for next HD Tuesday- has been accepted at [facility] TTS at 6:45 AM upon discharge; pre HD Na much improved Sat. Next HD Tuesday 3. 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 Vol excess - needs more aggressive UF with HD-  5. Anemia - Hgb 10.6- now to 9.6 Sat up to 9.8 post HD on Sunday, Aranesp 200 mcg on Thurs, started Fe 6/11.for tsat 23% 6/7- received 4 -125 doses - add weekly IV Fe 6. Sec HPT - Ca 8.5 corrected around 10, P 5.4 improving; Calcitriol 0.5 mcg- also on renvela- may not be helping GI issues although renvela is usually constipating; last iPTH 346 6/7 7. Nutrition - Alb 2.4, soft diet, vitamin. Still had PEG tube 8. C diff colitis - now negative. 9. Anxiety - Xanax pre-HD 10. CVA 11. Other - needs nail care by podiatry after d/c  [name], PA-C  [facility] [phone] 09/27/2014,8:52 AM  LOS: 16 days    Patient seen and examined, agree with above note with above modifications.  Tolerated HD yest  With 3 liters off- no belly pain this AM- next HD Tuesday- possibly home later this week  Annie Sable, MD 09/27/2014     Subjective:   No complaints. Slept through HD yesterday after xanax - ran the whole time  Objective Filed Vitals:   09/26/14 2030 09/26/14 2118 09/26/14 2130 09/27/14 0531  BP: 109/60 113/75 104/69 98/58  Pulse: 84 84 85 84  Temp:  97.5 F (36.4 C) 97.7 F (36.5 C) 97.9 F (36.6 C)  TempSrc:  Oral Oral Oral  Resp:  16 19 18    Height:      Weight:  98.6 kg (217 lb 6 oz)  97.5 kg (214 lb 15.2 oz)  SpO2:  95% 96% 100%   Physical Exam General: NAD, spirits ok Heart: RRR Lungs: coarse BS bilaterally Abdomen: soft PEG tub, dressing and binder in place Extremities: 1 + LE edema, some improvement from yesterday, still dependent throughout legs/hips Dialysis Access: maturing left AVF, using right IJ  Dialysis Orders: new start this admission - plan to transfer to [facility] at d/c  Additional Objective Labs: Basic Metabolic Panel:  Recent Labs Lab 09/22/14 1750 09/24/14 1603 09/26/14 1700  NA 131* 134* 143  K 4.9 4.6 4.3  CL 95* 99* 88*  CO2 22 24 26   GLUCOSE 46* 80 94  BUN 42* 28* 24*  CREATININE 6.32* 5.77* 5.09*  CALCIUM 8.4* 8.5* 8.2*  PHOS 8.6* 6.2* 5.4*   Liver Function Tests:  Recent Labs Lab 09/22/14 1750 09/24/14 1603 09/26/14 1700  ALBUMIN 2.3* 2.3* 2.2*   CBC:  Recent Labs Lab 09/21/14 0641 09/22/14 1025 09/24/14 1604 09/26/14 1700  WBC 16.2* 10.6* 8.2 7.6  NEUTROABS 12.9* 8.5*  --   --   HGB 10.1* 10.6* 9.6* 9.8*  HCT 32.5* 33.6* 30.5* 31.3*  MCV 96.7 94.6 93.6 94.6  PLT 237 270 262 218   Blood Culture  Component Value Date/Time   SDES ABSCESS PELVIS 08/17/2014 1451   SPECREQUEST NONE 08/17/2014 1451   CULT  08/17/2014 1451    NO GROWTH 3 DAYS Performed at [facility]    REPTSTATUS 08/21/2014 FINAL 08/17/2014 1451   Medications:   . ALPRAZolam  0.5 mg Oral Q T,Th,Sa-HD  . amiodarone  400 mg Oral BID  . aspirin  81 mg Oral Daily  . budesonide (PULMICORT) nebulizer solution  0.25 mg Nebulization BID  . calcitRIOL  0.5 mcg Oral Q T,Th,Sat-1800  . chlorhexidine  15 mL Mouth Rinse BID  . citalopram  20 mg Oral Daily  . darbepoetin (ARANESP) injection - DIALYSIS  200 mcg Intravenous Q Thu-HD  . feeding supplement (GLUCERNA SHAKE)  237 mL Per J Tube Q24H  . feeding supplement (PRO-STAT SUGAR FREE 64)  30 mL Per Tube TID BM  . ipratropium  0.5 mg  Nebulization TID  . levalbuterol  0.63 mg Nebulization TID  . levothyroxine  25 mcg Oral QAC breakfast  . megestrol  400 mg Per Tube BID  . midodrine  10 mg Oral Q1200  . multivitamin  1 tablet Oral QHS  . sevelamer carbonate  2.4 g Oral TID  . vitamin C  250 mg Oral Daily

## 2014-09-27 NOTE — Progress Notes (Signed)
0830 patient tearful this am.  Refusing visitors at this time.  Asks RN to promise no visitation from "[employer]".  Patient states she is not seeing "boyfriend" during hospital stay.  Sign place on patient door.  "All visitors to report to nurses station".

## 2014-09-27 NOTE — Progress Notes (Signed)
Physical Therapy Session Note  Patient Details  Name: [name] MRN: [id number] Date of Birth: [birth year]  Today's Date: 09/27/2014 PT Individual Time: 7322-0254 PT Individual Time Calculation (min): 55 min   Short Term Goals: Week 2:  PT Short Term Goal 1 (Week 2): Pt will perform sit<> stand with supervision from w/c height PT Short Term Goal 2 (Week 2): Pt will perform bed <> chair transfer supervision with LRAD PT Short Term Goal 3 (Week 2): Pt will ambulate x 60' w/ LRAD at S min/guard level with SaO2 in the 90's.  PT Short Term Goal 4 (Week 2): Pt will propel w/c 100' with supervision and min cueing PT Short Term Goal 5 (Week 2): Pt will ascend/descend 8 steps with R rail and minA  Skilled Therapeutic Interventions/Progress Updates:   Pt received lying in bed stating not having slept well last night.  Reluctantly agreeable to therapy session.  Performed bed mobility with HOB flat but with rail at min A level with cues for technique and hand placement.  Once at EOB, requires max A for scooting to EOB due to decreased trunk strength and decreased forward weight shift.  Pt stating needing to use the restroom.  Set up oxygen in preparation on 2LO2.  Ambulated to/from restroom with RW at mod A level with max verbal cues for safety with RW, increased L step and ensuring pt all the way to commode before sitting.  Pt able to void, still with loose stools, RN made aware.  Ambulated back to w/c as above.  Seated rest break before standing at sink to wash/dry hands with RW at min/mod a level with max cues for safe foot placement.  Pt tends to have most difficulty with turns and LLE tends to remain ahead of her causing LOB posteriorly and to the L.  Max cues to keep LLE under her.  Remainder of session focused on gait from room to hallway x 2 reps, 32' x 1 and another 30' x 1 with RW at min/guard to min A level (second rep showed marked improvement in stepping technique) with continued cues for pursed  lip breathing, upright posture and increased stride length (esp L step length).  Tolerated all therapy on 2LO2 with SaO2 in mid to upper 90's throughout.  Left pt in w/c with all needs in reach.    Therapy Documentation Precautions:  Precautions Precautions: Fall Precaution Comments:  abdominal binder, abdominal wound, 2-3L 02 to maintain Sp02 >90% Other Brace/Splint: abdominal binder Restrictions Weight Bearing Restrictions: No   Vital Signs: Therapy Vitals Temp: 97.6 F (36.4 C) Temp Source: Oral Pulse Rate: 78 Resp: 18 BP: 105/71 mmHg Patient Position (if appropriate): Lying Oxygen Therapy SpO2: 100 % O2 Device: Nasal Cannula O2 Flow Rate (L/min): 2 L/min Pain: 8/10 abdominal pain, however feels it is likely due to diarrhea and would like to allow medication to work before asking for pain meds.   See FIM for current functional status  Therapy/Group: Individual Therapy  Vista Deck 09/27/2014, 4:30 PM

## 2014-09-28 ENCOUNTER — Inpatient Hospital Stay (HOSPITAL_COMMUNITY): Payer: Self-pay | Admitting: Physical Therapy

## 2014-09-28 ENCOUNTER — Inpatient Hospital Stay (HOSPITAL_COMMUNITY): Payer: Self-pay | Admitting: Occupational Therapy

## 2014-09-28 MED ORDER — AMIODARONE HCL 200 MG PO TABS
400.0000 mg | ORAL_TABLET | Freq: Every day | ORAL | Status: DC
  Administered 2014-09-28 – 2014-10-02 (×5): 400 mg via ORAL
  Filled 2014-09-28 (×6): qty 2

## 2014-09-28 MED ORDER — AMIODARONE HCL 200 MG PO TABS: 200.0000 mg | ORAL_TABLET | Freq: Every day | ORAL | Status: DC

## 2014-09-28 NOTE — Progress Notes (Signed)
Discussed amiodarone taper with Cardiology PA. Patient with stable HR without symptoms and has been on current dose for a month. She recommended taper to 400 mg X 1 week then 200 mg daily and monitor for tolerance/ symptoms.

## 2014-09-28 NOTE — Progress Notes (Signed)
Social Work [name], female   DOB: 08/02/1955, 59 y.o.   MRN: [id number] Confusion with discharge date, it is 7/1, due to pt missed therapies with her medical issues last week. Renal PA aware and pt aware also. Will work on getting daughter in for education and work toward discharge 7/1.

## 2014-09-28 NOTE — Progress Notes (Signed)
[place]ICINE [facility] REHABILITATION     PROGRESS NOTE   CC Nausea Subjective/Complaints: No TF yesterday but had diarrhea x 2 yest pm. Pt states her family brings in food which is not recorded as part of her intake   ROS: Pt denies , + mild abdominal pain,SOB, chest pain,, anxiety, or depression, although the pt appears anxious when discussing her medical issues   Objective: Vital Signs: Blood pressure 115/70, pulse 85, temperature 97.4 F (36.3 C), temperature source Oral, resp. rate 18, height 5\' 7"  (1.702 m), weight 97.1 kg (214 lb 1.1 oz), SpO2 94 %. No results found.  Recent Labs  09/26/14 1700  WBC 7.6  HGB 9.8*  HCT 31.3*  PLT 218    Recent Labs  09/26/14 1700  NA 143  K 4.3  CL 88*  GLUCOSE 94  BUN 24*  CREATININE 5.09*  CALCIUM 8.2*   CBG (last 3)  No results for input(s): GLUCAP in the last 72 hours.  Wt Readings from Last 3 Encounters:  09/28/14 97.1 kg (214 lb 1.1 oz)  09/10/14 98.975 kg (218 lb 3.2 oz)  06/01/14 102.059 kg (225 lb)    Physical Exam:  Gen: alert, no distress, obese HENT:  Poor dentition,  Eyes: EOM are normal.  Neck: Normal range of motion. Neck supple. No thyromegaly present.  Cardiovascular: Normal rate and regular rhythm. no murmurs Respiratory: Effort normal and breath sounds normal. No respiratory distress. occ rhonchi and upper airway sounds. No cough today GI: Bowel sounds are normal. Mild diffuse tenderness Abdominal wound granulation tissue pink. G-tube site clean and dry , no drainage Skin: l, 2+ edema RLE, trace edema RLE Neurological: She is alert.  She is oriented to person place date of birth.  LUE 4/5 deltoid, bicep, tricep. 4/5 wrist/hand. RUE grossly 4+/5. LE: 3+ RHF, 3+ LHF, 3+ RKE, 3 LKE, ankles grossly 3+ to 4-/5. Marland Kitchen. Intact LT BLE.   cooperative.  Psych: pleasant  , mood affect depressed  Assessment/Plan: 1. 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 which require 3+ hours per day  of interdisciplinary therapy in a comprehensive inpatient rehab setting. Physiatrist is providing close team supervision and 24 hour management of active medical problems listed below. Physiatrist and rehab team continue to assess barriers to discharge/monitor patient progress toward functional and medical goals.  Diarrhea and nausea, hx of ischemic colitis which is likely the etiology of diarrhea,, consider GI outpt f/u for chronic management, , on  megace VT to boost appetite, if not helping will d/c prior to discharge Has 2 central lines and AV fistula.  The fistula should be ready to use next month, should be able to d/c one of the central lines, would like renal input on this FIM: FIM - Bathing Bathing Steps Patient Completed: Chest, Right Arm, Left Arm, Abdomen, Front perineal area, Buttocks, Right upper leg, Left upper leg Bathing: 4: Min-Patient completes 8-9 [id number] 10 parts or 75+ percent  FIM - Upper Body Dressing/Undressing Upper body dressing/undressing steps patient completed: Thread/unthread right sleeve of pullover shirt/dresss, Thread/unthread left sleeve of pullover shirt/dress, Put head through opening of pull over shirt/dress, Pull shirt over trunk Upper body dressing/undressing: 0: Wears gown/pajamas-no public clothing FIM - Lower Body Dressing/Undressing Lower body dressing/undressing steps patient completed: Thread/unthread right pants leg, Pull pants up/down, Don/Doff right shoe Lower body dressing/undressing: 0: Wears gown/pajamas-no public clothing  FIM - Toileting Toileting steps completed by patient: Adjust clothing prior to toileting Toileting Assistive Devices: Grab bar or rail  for support Toileting: 2: Max-Patient completed 1 of 3 steps  FIM - Diplomatic Services operational officer Devices: Grab bars, Elevated toilet seat, Art gallery manager Transfers: 4-To toilet/BSC: Min A (steadying Pt. > 75%), 4-From toilet/BSC: Min A (steadying Pt. > 75%)  FIM - Bed/Chair  Transfer Bed/Chair Transfer Assistive Devices: Arm rests, Bed rails Bed/Chair Transfer: 4: Supine > Sit: Min A (steadying Pt. > 75%/lift 1 leg)  FIM - Locomotion: Wheelchair Distance: 40 Locomotion: Wheelchair: 0: Activity did not occur FIM - Locomotion: Ambulation Locomotion: Ambulation Assistive Devices: Designer, industrial/product Ambulation/Gait Assistance: 4: Min guard, 4: Min assist Locomotion: Ambulation: 1: Travels less than 50 ft with minimal assistance (Pt.>75%)  Comprehension Comprehension Mode: Auditory Comprehension: 5-Follows basic conversation/direction: With extra time/assistive device  Expression Expression Mode: Verbal Expression: 5-Expresses basic needs/ideas: With extra time/assistive device  Social Interaction Social Interaction: 4-Interacts appropriately 75 - 89% of the time - Needs redirection for appropriate language or to initiate interaction.  Problem Solving Problem Solving: 5-Solves basic 90% of the time/requires cueing < 10% of the time  Memory Memory: 5-Recognizes or recalls 90% of the time/requires cueing < 10% of the time  Medical Problem List and Plan: 1. 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 status post loop recorder/respiratory failure/multi-medical  -tolerating therapies thus far 2. DVT Prophylaxis/Anticoagulation: SCD.  Venous Doppler studies negative, can use TEDs, no need for repeat Doppler 3. Pain Management: Oxycodone as needed. Fair control at present 4. End-stage renal disease. Left arm AV fistula 09/09/2014. Hemodialysis as per renal services 5. Neuropsych: This patient is capable of making decisions on her own behalf. 6. Skin/Wound Care: Routine skin checks/dressing abdominal wound with wet-to-dry, pitting edema worse on hemiparetic side TEDS 7. Fluids/Electrolytes/Nutrition: . Check labs Monday  -continuous TF on board overnight- 8. Hx of C diff: at risk for recurrence, has diarrhea, elevated WBC, recheck 9.  Hypertension/intermittent bouts of PSVT. Lopressor 12.5 mg twice a day--rate controlled. 10. Chronic Ischemic colitis, thper Gen surg this is most likely etiology of diarrhea, abd discomfort and low BPs during HD may contribute11. Decreased nutritional storage.Status post GJ tube 08/12/2014 per interventional radiology, not "mature " enough to d/c 12. Hypothyroidism. Synthroid 13. Mood/anxiety. Ativan as needed 14. Intermittent cough, "?asthma"---pulmicort neb prn effective. May be anxiety component also, on celexa, monitor 15.  hypoNa- per renal address in HD LOS (Days) 17 A FACE TO FACE EVALUATION WAS PERFORMED  Erick Colace 09/28/2014 7:58 AM

## 2014-09-28 NOTE — Progress Notes (Addendum)
Occupational Therapy Session Note  Patient Details  Name: [name] MRN: [id number] Date of Birth: 1955/11/10  Today's Date: 09/28/2014 OT Individual Time: 7829-56210730-0830 OT Individual Time Calculation (min): 60 min    Short Term Goals: Week 3:  OT Short Term Goal 1 (Week 3): Pt will perform LB dressing with Mod A in order to decrease level of assistance for self care. OT Short Term Goal 2 (Week 3): Pt will complete toilet transfer with RW at supervision level OT Short Term Goal 3 (Week 3): Pt will complete bathing at sink with supervision at sit > stand level OT Short Term Goal 4 (Week 3): Pt will complete 3/3 toileting tasks with supervision  Skilled Therapeutic Interventions/Progress Updates:    Pt seen for OT ADL bathing and dressing session. Pt in supine upon arrival, set-up with breakfast tray and agreeable to tx. Pt transferred supine> EOB with bed rail and min A with VCs for log rolling technique. Attempted transfer without use of bed rails, however due to decreased UB strength unable to push self into upright position. Pt sat on EOB to eat remainder of breakfast and to complete bathing and dressing task. Pt stood with mod A at RW and complete buttock/pericare hygiene with close supervision and one UE support on walker. Pt's O2 level dropped to 83% following extended standing on 2.5L O2, able to recover with exttended seated rest break and VCs for deep breathing technique. Pt required assist to thread L LE into pants and underwear due to LE weakness she reports from previous CVA. With steadying assist, pt able to pull pants up. UB dressing completed with set-up. She completed stand pivot transfer to w/c with assist for controlled descent. Pt left in w/c at end of session, all needs in reach and RN present.   Pt educated regarding deep breathing techniques, modified dressing techniques, and energy conservation.  Pt without abdominal binder donned upon arrival, she required encouragement to  have it placed on this AM. Left with binder donned at end of session. Pt requested L LE TED hose only, applied by therapist.   Therapy Documentation Precautions:  Precautions Precautions: Fall Precaution Comments:  abdominal binder, abdominal wound, 2-3L 02 to maintain Sp02 >90% Other Brace/Splint: abdominal binder Restrictions Weight Bearing Restrictions: No Pain: Pain Assessment Pain Assessment: 0-10 Pain Score: No/ denied pain  See FIM for current functional status  Therapy/Group: Individual Therapy  Lewis, Tolulope Pinkett C 09/28/2014, 7:17 AM

## 2014-09-28 NOTE — Progress Notes (Signed)
Subjective:  About to go to PT/ co  mild some diarrhea last pm / HD tomor. On schedule   Objective Vital signs in last 24 hours: Filed Vitals:   09/27/14 0531 09/27/14 1500 09/27/14 2042 09/28/14 0606  BP: 98/58 105/71 112/70 115/70  Pulse: 84 78 82 85  Temp: 97.9 F (36.6 C) 97.6 F (36.4 C)  97.4 F (36.3 C)  TempSrc: Oral Oral  Oral  Resp: 18 18  18   Height:      Weight: 97.5 kg (214 lb 15.2 oz)   97.1 kg (214 lb 1.1 oz)  SpO2: 100% 100%  94%   Weight change: -4.506 kg (-9 lb 14.9 oz)  Physical Exam:  General: Alert , sl confused "have dialysis today" briefly reoriented and  Then OX3/ NAD  Heart: RRR no mur , rub  Or gallop Lung bilaterally exp wheezes/ non labored breathing taking Nebulizer  tx Abdomen: soft ,PEG tub, dressing and binder in place Extremities: 1 + LE edema, and  dependent throughout legs/hips Dialysis Access: maturing left AVF pos bruit , using right IJneral:    OP Dialysis Orders: new start this admission - plan to [facility] to [facility] at d/c 6:45 am   Problem/Plan:  1. Pyloric ulcer perf s/p exlap complicated by duod fistula= which has healed over- waxing and waning symptoms-  2. ESRD - new start to HD, on TTS schedule,( op will [facility]  tt[facility]) using R IJ catheter; AVF placed 01/28/14 by Dr. Darrick PennaFields, s/p revision 6/8  Dr. Arbie CookeyEarly.  Maybe  use early July- pre HD Na much improved Sat. Next HD [place].  3. CVA/ Deconditioning- on rehab  4. Hypotension  115/70 bp  /on daily midodrine at noon - recent extra dose pre HD on TTS- Hx severe RHF by echo-  UF  titrate down as able 5. Vol excess - needs more aggressive UF with HD- with  Pedal  Edema  6. Anemia - Hgb 10.6- 9.6 ->  9.8 , Aranesp 200 mcg on Thurs, started Fe 6/11.for tsat 23% 6/7- received 4 -125 doses - add weekly IV Fe 7. Sec HPT - Ca 8.5 corrected around 10, P 5.4 improving; Calcitriol 0.5 mcg- also on renvela- may not be helping GI issues although renvela is usually constipating; last iPTH 346  6/7 8. Nutrition - Alb 2.2, soft diet, vitamin. Has Megace and  Still had PEG tube 9. C diff colitis - now negative. 10. Anxiety - Xanax pre-HD 11. Other - needs nail care by podiatry after d/c  Lenny Pastelavid Zeyfang, PA-C [facility] Beeper [phone] 09/28/2014,10:21 AM  LOS: 17 days   Pt seen, examined and agree w A/P as above.  Vinson Moselleob Tzipora Mcinroy MD pager [phone]    cell [phone] 09/28/2014, 1:35 PM    Labs: Basic Metabolic Panel:  Recent Labs Lab 09/22/14 1750 09/24/14 1603 09/26/14 1700  NA 131* 134* 143  K 4.9 4.6 4.3  CL 95* 99* 88*  CO2 22 24 26   GLUCOSE 46* 80 94  BUN 42* 28* 24*  CREATININE 6.32* 5.77* 5.09*  CALCIUM 8.4* 8.5* 8.2*  PHOS 8.6* 6.2* 5.4*   Liver Function Tests:  Recent Labs Lab 09/22/14 1750 09/24/14 1603 09/26/14 1700  ALBUMIN 2.3* 2.3* 2.2*   CBC:  Recent Labs Lab 09/22/14 1025 09/24/14 1604 09/26/14 1700  WBC 10.6* 8.2 7.6  NEUTROABS 8.5*  --   --   HGB 10.6* 9.6* 9.8*  HCT 33.6* 30.5* 31.3*  MCV 94.6 93.6 94.6  PLT 270 262 218    Studies/Results: No results found. Medications:   . ALPRAZolam  0.5 mg Oral Q T,Th,Sa-HD  . [START ON 10/05/2014] amiodarone  200 mg Oral Daily  . amiodarone  400 mg Oral Daily  . aspirin  81 mg Oral Daily  . budesonide (PULMICORT) nebulizer solution  0.25 mg Nebulization BID  . calcitRIOL  0.5 mcg Oral Q T,Th,Sat-1800  . chlorhexidine  15 mL Mouth Rinse BID  . citalopram  20 mg Oral Daily  . darbepoetin (ARANESP) injection - DIALYSIS  200 mcg Intravenous Q Thu-HD  . feeding supplement (GLUCERNA SHAKE)  237 mL Per J Tube Q24H  . feeding supplement (PRO-STAT SUGAR FREE 64)  30 mL Per Tube TID BM  . ferric gluconate (FERRLECIT/NULECIT) IV  125 mg Intravenous Once  . ipratropium  0.5 mg Nebulization TID  . levalbuterol  0.63 mg Nebulization TID  . levothyroxine  25 mcg Oral QAC breakfast  . megestrol  400 mg Per Tube BID  . [START ON 09/29/2014] midodrine  10 mg Oral Q T,Th,Sa-HD  .  midodrine  10 mg Oral QAC breakfast  . multivitamin  1 tablet Oral QHS  . sevelamer carbonate  2.4 g Oral TID  . vitamin C  250 mg Oral Daily

## 2014-09-28 NOTE — Progress Notes (Addendum)
Physical Therapy Weekly Progress Note  Patient Details  Name: [name] MRN: [id number] Date of Birth: 06-Dec-1955  Beginning of progress report period: [name] 20, 2016 End of progress report period: [name] 27, 2016  Today's Date: 09/28/2014 PT Individual Time: 1000-1125 and 1400-1500 PT Individual Time Calculation (min): 85 min and 60 min  Patient has met 1 of 5 short term goals.  Pt is minA overall for transfers, gait and stairs, however continues to be limited by strength and endurance impairments. Pt has been making slow progress towards goals due to GI involvement and pt declining to participate in several treatments over the last week due to stomach pain, nausea. Pt remains very anxious about her situation and requires increased motivation to participate, however pt does state a desire to d/c at ambulatory level.   Patient continues to demonstrate the following deficits: reduced endurance, strength deficits, balance impairments and therefore will continue to benefit from skilled PT intervention to enhance overall performance with activity tolerance, balance, postural control, ability to compensate for deficits, functional use of  left lower extremity, awareness and coordination.  Patient not progressing toward long term goals.  See goal revision..  Plan of care revisions: Goals downgraded due to slow progress.  PT Short Term Goals Week 3:  PT Short Term Goal 1 (Week 3): STG = LTG due to length of stay  Skilled Therapeutic Interventions/Progress Updates:    AM session: Pt received in w/c with c/o pain as described below. Pt instructed in donning socks and shoes, required modA due to poor forward reaching. Performed w/c propulsion with BUEs x150' with supervision. Requires inc time to perform due to slow speed and frequent rest breaks due to fatigue. Pt instructed in gait training in hallway with RW, supervision, and 2L O2 via [place]. Pt performs three trials of ambulation for 40', 25' and 35';  requires extended seated rest breaks between due to fatigue. O2 sat measured following initial trial at 100%. Activity requires increased time to perform due to slow speed of gait as well as pt request for long rest breaks. Performed Nustep on level 1 with average 15 steps/min x7 min total active time with several rest breaks throughout due to fatigue. Pt reports feeling nauseous and requests to return to room. O2 sat measured at 100% on room air while transitioning from O2 tank to wall, and pt left on room air. Nurse alerted regarding pt status; pt remained seated in w/c with all needs within reach at completion of session.   PM session: Pt received in w/c with no c/o pain and agreeable to treatment. Performed ascent/descent of 8 stairs with minA, verbal cues for correct lead foot, and modA for sit <> stand. Following performance of stairs, O2 sat measured at 93% on room air. Performed gait x40 ft with modA sit <> stand, and CGA overall for gait. Performed standing activity while playing game; performed 3 trials of approximately 5 min with seated rest breaks in between due to fatigue. Performed to improve activity tolerance for carryover into gait and adls. Pt voiced concerns regarding being able to manage medications upon returning home; states she did not tell the doctor but she had stopped taking her BP medication prior to admission, and took her "fluid pill" less often than recommended because of the inconvenience of frequent urination. Pt reassured that she would be educated regarding what medications to take prior to d/c. At end of session O2 sat measured at 100% on room air; pt left off  O2 via [place] and nurse alerted regarding vitals. Pt returned to room and remained seated in w/c with all needs within reach at completion of session.   Therapy Documentation Precautions:  Precautions Precautions: Fall Precaution Comments:  abdominal binder, abdominal wound, 2-3L 02 to maintain Sp02 >90% Other  Brace/Splint: abdominal binder Restrictions Weight Bearing Restrictions: No Pain: Pain Assessment Pain Assessment: 0-10 Pain Score: 8  Faces Pain Scale: Hurts little more Pain Type: Acute pain Pain Location: Abdomen Pain Orientation: Mid Pain Descriptors / Indicators: Aching;Sore Pain Onset: On-going Patients Stated Pain Goal: 2 Pain Intervention(s): Ambulation/increased activity;Repositioned Multiple Pain Sites: No Mobility: Bed Mobility Bed Mobility: Supine to Sit;Sit to Supine Supine to Sit: 5: Supervision Supine to Sit Details: Tactile cues for posture;Verbal cues for sequencing Sit to Supine: 4: Min assist Sit to Supine - Details: Verbal cues for sequencing;Manual facilitation for placement Sit to Supine - Details (indicate cue type and reason): manual A with LLE management Transfers Transfers: Yes Sit to Stand: 4: Min assist Sit to Stand Details: Verbal cues for sequencing;Verbal cues for technique;Manual facilitation for weight shifting Stand to Sit: 5: Supervision;With armrests Stand to Sit Details (indicate cue type and reason): Verbal cues for sequencing Stand Pivot Transfers: 5: Supervision Stand Pivot Transfer Details: Verbal cues for sequencing;Verbal cues for safe use of DME/AE Locomotion : Ambulation Ambulation: Yes Ambulation/Gait Assistance: 4: Min guard Ambulation Distance (Feet): 40 Feet Assistive device: Rolling walker Ambulation/Gait Assistance Details: Verbal cues for safe use of DME/AE Gait Gait: Yes Gait Pattern: Impaired Gait Pattern: Decreased step length - left;Decreased step length - right;Wide base of support;Poor foot clearance - left;Shuffle Stairs / Additional Locomotion Stairs: Yes Stairs Assistance: 4: Min assist Stairs Assistance Details: Verbal cues for sequencing;Verbal cues for gait pattern Stair Management Technique: Two rails;Step to pattern Number of Stairs: 5 Height of Stairs: 6 Wheelchair Mobility Wheelchair Mobility:  Yes Wheelchair Assistance: 5: Careers information officer: Both upper extremities Wheelchair Parts Management: Needs assistance Distance: 150   See FIM for current functional status  Therapy/Group: Individual Therapy  Luberta Mutter 09/28/2014, 1:13 PM

## 2014-09-29 ENCOUNTER — Inpatient Hospital Stay (HOSPITAL_COMMUNITY): Payer: Medicaid Other

## 2014-09-29 ENCOUNTER — Inpatient Hospital Stay (HOSPITAL_COMMUNITY): Payer: Medicaid Other | Admitting: Physical Therapy

## 2014-09-29 ENCOUNTER — Inpatient Hospital Stay (HOSPITAL_COMMUNITY): Payer: Self-pay | Admitting: Occupational Therapy

## 2014-09-29 ENCOUNTER — Inpatient Hospital Stay (HOSPITAL_COMMUNITY): Payer: Self-pay | Admitting: Physical Therapy

## 2014-09-29 LAB — CBC
HEMATOCRIT: 31.5 % — AB (ref 36.0–46.0)
Hemoglobin: 9.9 g/dL — ABNORMAL LOW (ref 12.0–15.0)
MCH: 28.9 pg (ref 26.0–34.0)
MCHC: 31.4 g/dL (ref 30.0–36.0)
MCV: 92.1 fL (ref 78.0–100.0)
Platelets: 259 10*3/uL (ref 150–400)
RBC: 3.42 MIL/uL — AB (ref 3.87–5.11)
RDW: 18.3 % — ABNORMAL HIGH (ref 11.5–15.5)
WBC: 8.4 10*3/uL (ref 4.0–10.5)

## 2014-09-29 LAB — RENAL FUNCTION PANEL
ALBUMIN: 2.1 g/dL — AB (ref 3.5–5.0)
Anion gap: 10 (ref 5–15)
BUN: 40 mg/dL — AB (ref 6–20)
CALCIUM: 8 mg/dL — AB (ref 8.9–10.3)
CO2: 24 mmol/L (ref 22–32)
Chloride: 96 mmol/L — ABNORMAL LOW (ref 101–111)
Creatinine, Ser: 5.91 mg/dL — ABNORMAL HIGH (ref 0.44–1.00)
GFR calc Af Amer: 8 mL/min — ABNORMAL LOW (ref >60)
GFR calc non Af Amer: 7 mL/min — ABNORMAL LOW (ref >60)
Glucose, Bld: 104 mg/dL — ABNORMAL HIGH (ref 65–99)
PHOSPHORUS: 4.5 mg/dL (ref 2.5–4.6)
POTASSIUM: 4 mmol/L (ref 3.5–5.1)
Sodium: 130 mmol/L — ABNORMAL LOW (ref 135–145)

## 2014-09-29 LAB — PREALBUMIN: Prealbumin: 12 mg/dL — ABNORMAL LOW (ref 18–38)

## 2014-09-29 MED ORDER — BOOST / RESOURCE BREEZE PO LIQD
1.0000 | Freq: Three times a day (TID) | ORAL | Status: DC
  Administered 2014-09-29 – 2014-10-02 (×7): 1 via ORAL

## 2014-09-29 MED ORDER — OXYCODONE HCL 5 MG PO TABS
5.0000 mg | ORAL_TABLET | Freq: Once | ORAL | Status: AC
  Administered 2014-09-29: 5 mg via ORAL
  Filled 2014-09-29: qty 1

## 2014-09-29 MED ORDER — JEVITY 1.2 CAL PO LIQD
1000.0000 mL | ORAL | Status: DC
  Administered 2014-09-29: 1 mL/h
  Administered 2014-09-30 – 2014-10-01 (×2): 1000 mL
  Filled 2014-09-29 (×7): qty 1000

## 2014-09-29 MED ORDER — PRO-STAT SUGAR FREE PO LIQD
30.0000 mL | Freq: Two times a day (BID) | ORAL | Status: DC
  Administered 2014-09-29 – 2014-09-30 (×2): 30 mL
  Administered 2014-10-01: 14:00:00
  Administered 2014-10-01: 30 mL
  Filled 2014-09-29 (×7): qty 30

## 2014-09-29 MED ORDER — COLLAGENASE 250 UNIT/GM EX OINT
TOPICAL_OINTMENT | Freq: Every day | CUTANEOUS | Status: DC
  Administered 2014-09-30 – 2014-10-02 (×3): via TOPICAL
  Filled 2014-09-29: qty 30

## 2014-09-29 NOTE — Progress Notes (Signed)
[facility]     PROGRESS NOTE   CC Nausea and poor appetite                                                   Subjective/Complaints:  Pt without new issues overnite, no vomiting, per RN has not received TF at noc Glucerna and Nepro  Caused diarrhea    ROS: Pt denies , + mild abdominal pain,SOB, chest pain,, anxiety, or depression, otherwise as above  Objective: Vital Signs: Blood pressure 106/72, pulse 88, temperature 98.5 F (36.9 C), temperature source Oral, resp. rate 18, height $RemoveBeforeDE weight 98.2 kg (216 lb 7.9 oz), SpO2 95 %. No results found.  Recent Labs  09/26/14 1700  WBC 7.6  HGB 9.8*  HCT 31.3*  PLT 218    Recent Labs  09/26/14 1700  NA 143  K 4.3  CL 88*  GLUCOSE 94  BUN 24*  CREATININE 5.09*  CALCIUM 8.2*   CBG (last 3)  No results for input(s): GLUCAP in the last 72 hours.  Wt Readings from Last 3 Encounters:  09/29/14 98.2 kg (216 lb 7.9 oz)  09/10/14 98.975 kg (218 lb 3.2 oz)  06/01/14 102.059 kg (225 lb)    Physical Exam:  Gen: alert, no distress, obese HENT:  Poor dentition,  Eyes: EOM are normal.  Neck: Normal range of motion. Neck supple. No thyromegaly present.  Cardiovascular: Normal rate and regular rhythm. no murmurs Respiratory: Effort normal and breath sounds normal. No respiratory distress. occ rhonchi and upper airway sounds. No cough today GI: Bowel sounds are normal. Mild diffuse tenderness Abdominal wound granulation tissue pink, granulation at inferior margin where it tunnels inferiorly. G-tube site clean and dry , no drainage Skin: l, 2+ edema RLE, trace edema RLE Neurological: She is alert.  She is oriented to person place date of birth.  LUE 4/5 deltoid, bicep, tricep. 4/5 wrist/hand. RUE grossly 4+/5. LE: 3+ RHF, 3+ LHF, 3+ RKE, 3 LKE, ankles grossly 3+ to 4-/5. Marland Kitchen Intact LT BLE.   cooperative.  Psych: pleasant  , mood affect depressed  Assessment/Plan: 1. Functional  deficits secondary to right ACA infarct which require 3+ hours per day of interdisciplinary therapy in a comprehensive inpatient rehab setting. Physiatrist is providing close team supervision and 24 hour management of active medical problems listed below. Physiatrist and rehab team continue to assess barriers to discharge/monitor patient progress toward functional and medical goals.  Diarrhea and nausea, hx of ischemic colitis which is likely the etiology of diarrhea,, consider GI outpt f/u for chronic management, , on  megace VT to boost appetite, if not helping will d/c prior to discharge Has 2 central lines and AV fistula.  The fistula should be ready to use next month, per Nephro, using R IJ line for dialysis, IR to removed Left tunneled catheter prior to d/c   Dietary assistance requested to see if any TF formul would be better tolerated for supplemental feeds, main issues are diarrhea post TF, ?may need Pump for slow rate FIM: FIM - Bathing Bathing Steps Patient Completed: Chest, Right Arm, Left Arm, Abdomen, Front perineal area, Buttocks, Right upper leg, Left upper leg, Right lower leg (including foot), Left lower leg (including foot) Bathing: 4: Steadying assist  FIM - Upper Body Dressing/Undressing Upper body dressing/undressing steps  patient completed: Thread/unthread right sleeve of pullover shirt/dresss, Thread/unthread left sleeve of pullover shirt/dress, Put head through opening of pull over shirt/dress, Pull shirt over trunk Upper body dressing/undressing: 5: Set-up assist to: Obtain clothing/put away FIM - Lower Body Dressing/Undressing Lower body dressing/undressing steps patient completed: Thread/unthread right pants leg, Pull pants up/down, Don/Doff right shoe Lower body dressing/undressing: 2: Max-Patient completed 25-49% of tasks  FIM - Toileting Toileting steps completed by patient: Adjust clothing prior to toileting Toileting Assistive Devices: Grab bar or rail for  support Toileting: 2: Max-Patient completed 1 of 3 steps  FIM - Diplomatic Services operational officerToilet Transfers Toilet Transfers Assistive Devices: Grab bars, Elevated toilet seat, Art gallery managerWalker Toilet Transfers: 4-To toilet/BSC: Min A (steadying Pt. > 75%), 4-From toilet/BSC: Min A (steadying Pt. > 75%)  FIM - Bed/Chair Transfer Bed/Chair Transfer Assistive Devices: Arm rests, Bed rails Bed/Chair Transfer: 4: Supine > Sit: Min A (steadying Pt. > 75%/lift 1 leg)  FIM - Locomotion: Wheelchair Distance: 150 Locomotion: Wheelchair: 0: Activity did not occur FIM - Locomotion: Ambulation Locomotion: Ambulation Assistive Devices: Designer, industrial/productWalker - Rolling Ambulation/Gait Assistance: 4: Min guard Locomotion: Ambulation: 1: Travels less than 50 ft with minimal assistance (Pt.>75%)  Comprehension Comprehension Mode: Auditory Comprehension: 5-Follows basic conversation/direction: With extra time/assistive device  Expression Expression Mode: Verbal Expression: 5-Expresses basic needs/ideas: With extra time/assistive device  Social Interaction Social Interaction: 4-Interacts appropriately 75 - 89% of the time - Needs redirection for appropriate language or to initiate interaction.  Problem Solving Problem Solving: 5-Solves basic 90% of the time/requires cueing < 10% of the time  Memory Memory: 5-Recognizes or recalls 90% of the time/requires cueing < 10% of the time  Medical Problem List and Plan: 1. 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 status post loop recorder/respiratory failure/multi-medical  -tolerating therapies thus far 2. DVT Prophylaxis/Anticoagulation: SCD.  Venous Doppler studies negative, can use TEDs, no need for repeat Doppler 3. Pain Management: Oxycodone as needed. Fair control at present 4. End-stage renal disease. Left arm AV fistula 09/09/2014. Hemodialysis as per renal services 5. Neuropsych: This patient is capable of making decisions on her own behalf. 6. Skin/Wound Care: Routine skin  checks/dressing abdominal wound with wet-to-dry, pitting edema worse on hemiparetic side, add santyl collagenase to inferior aspect  TEDS 7. Fluids/Electrolytes/Nutrition: . Check labs Monday  -continuous TF on board overnight- 8. Hx of C diff: at risk for recurrence, has diarrhea, elevated WBC, recheck 9. Hypertension/intermittent bouts of PSVT. Lopressor 12.5 mg twice a day--rate controlled. 10. Chronic Ischemic colitis, thper Gen surg this is most likely etiology of diarrhea, abd discomfort and low BPs during HD may contribute11. Decreased nutritional storage.Status post GJ tube 08/12/2014 per interventional radiology, still needs TF to supplement po 12. Hypothyroidism. Synthroid 13. Mood/anxiety. Ativan as needed 14. Intermittent cough, "?asthma"---pulmicort neb prn effective. May be anxiety component also, on celexa, monitor 15.  hypoNa- per renal address in HD LOS (Days) 18 A FACE TO FACE EVALUATION WAS PERFORMED  KIRSTEINS,ANDREW E 09/29/2014 8:11 AM

## 2014-09-29 NOTE — Procedures (Signed)
Successful bedside removal of non tunneled left internal jugular approach PICC. No immediate complications.

## 2014-09-29 NOTE — Progress Notes (Addendum)
Physical Therapy Session Note  Patient Details  Name: [name] MRN: [id number] Date of Birth: [birth year]  Today's Date: 09/29/2014 PT Individual Time: 0802-0900 and 1130-1200, and 1300-1330 PT Individual Time Calculation (min): 58 min and 30 min and 30 min (total 118 min)  Short Term Goals: Week 2:  PT Short Term Goal 1 (Week 2): Pt will perform sit<> stand with supervision from w/c height PT Short Term Goal 1 - Progress (Week 2): Not met PT Short Term Goal 2 (Week 2): Pt will perform bed <> chair transfer supervision with LRAD PT Short Term Goal 2 - Progress (Week 2): Not met PT Short Term Goal 3 (Week 2): Pt will ambulate x 34' w/ LRAD at S min/guard level with SaO2 in the 90's.  PT Short Term Goal 3 - Progress (Week 2): Not met PT Short Term Goal 4 (Week 2): Pt will propel w/c 100' with supervision and min cueing PT Short Term Goal 4 - Progress (Week 2): Met PT Short Term Goal 5 (Week 2): Pt will ascend/descend 8 steps with R rail and minA PT Short Term Goal 5 - Progress (Week 2): Not met  Skilled Therapeutic Interventions/Progress Updates:   0800-0900: Pt received supine in bed with nursing staff present; c/o pain as described below but agreeable to treatment. Pt reports she would not like to leave her bedroom because she is unable to put clothes on since she has a surgery scheduled for today and can only wear gown. Pt instructed in bed mobility with minA and ambulation to sink with RW and supervision; standing at sink pt performs brushing teeth and washing face. Requires seated rest break at completion of approximately 3 min of activity. Pt instructed in gait training for 2 trials of approximately 15' with RW and supervision; sit <>stand requires mod tactile cues for forward weight shifting. PROM of LEs performed to improve ROM and posture for carryover into gait and transfers. Performed hamstring, hip IR/ER stretch to BLEs for 2 sets of 45 sec each. Pt instructed in standing hip  flexor stretch and educated on importance of inc ROM in hips to allow upright posture during gait and reduce stress on spine. Pt has difficulty performing stretch due to poor balance and body habitus, however tolerated standing x approx 3 min while attempting. Performed seated LE strengthening exercises including hip marching and hip adduction with pillow squeeze. Pt remained seated in w/c at completion of session with all needs within reach.   1130-1200: Pt received supine in bed with c/o pain in chest where picc line removed earlier today. Pt declines OOB activity at this time due to discomfort and waiting on pain medication from RN. Pt reports concern about her daughter not answering the phone. Pt instructed in supine LE strengthening exercises including knee-to-chest, straight leg raise, glute sets, bridging, ankle pumps. Pt requires extended rest breaks due to fatigue between each set. Pt remained supine in bed with all needs within reach at completion of session. RN alerted that pt is requesting pain medication.  1300-1330: Pt received supine in bed with no c/o pain and agreeable to treatment. Pt instructed in upper/lower body dressing with modA due to time constraints. In therapy gym pt instructed in ascent/descent of 4 stairs. Upon reaching top of stairs on initial trial, pt began to descend with LLE to first step with minA and therapist in front of pt. Pt appeared to hesitate and attempt to return foot to top step and R leg slowly gave out  as pt attempted to lean against back wall of stairs, resulting in a fall with slow descent controlled by UEs and assist from therapist to control descent. Pt returned to standing with maxA +2, safely descended stairs and returned to chair with minA. Pt reports no pain, and in room pt gluteal area inspected WNL, no signs of redness or irritation. Pt instructed in standing marching with RW at bedside to reduce pt anxiety about LE weakness and fear of falling; performed  with CGA and no LOB. Pt remained seated in w/c at completion of session with all needs within reach. RN [name] and PA [name] notified; attempted to contact daughter [name] however unable to get in touch. Nursing staff will attempt to contact again later.  Therapy Documentation Precautions:  Precautions Precautions: Fall Precaution Comments:  abdominal binder, abdominal wound, 2-3L 02 to maintain Sp02 >90% Other Brace/Splint: abdominal binder Restrictions Weight Bearing Restrictions: No Vital Signs: Oxygen Therapy O2 Device: Not Delivered Pain: Pain Assessment Pain Assessment: 0-10 Pain Score: 7  Pain Type: Acute pain Pain Location: Abdomen Pain Orientation: Left Pain Descriptors / Indicators: Aching;Sore Pain Onset: On-going Patients Stated Pain Goal: 2 Pain Intervention(s): Ambulation/increased activity;Repositioned Multiple Pain Sites: No  See FIM for current functional status  Therapy/Group: Individual Therapy  [name] 09/29/2014, 11:28 AM

## 2014-09-29 NOTE — Progress Notes (Signed)
Nutrition Follow-up  DOCUMENTATION CODES:  Obesity unspecified  INTERVENTION:  Initiate 48 hour calorie count.  Initiate nocturnal tube feeds from 8pm-8am (12 hours) of Jevity 1.2 formula via J-tube at goal rate of 70 ml/hr with 30 ml Prostat BID per tube to provide 1208 kcal (60% of kcal needs), 77 grams of protein (70% of protein needs), and 680 ml of free water.   Provide Resource Breeze po TID, each supplement provides 250 kcal and 9 grams of protein.  Encourage adequate PO intake.   RD to continue to monitor.    NUTRITION DIAGNOSIS:  Increased nutrient needs related to chronic illness as evidenced by estimated needs; ongoing  GOAL:  Patient will meet greater than or equal to 90% of their needs; not met  MONITOR:  PO intake, Supplement acceptance, TF tolerance, Weight trends, Labs, I & O's  REASON FOR ASSESSMENT:  Consult Calorie Count  ASSESSMENT: Pt with history of stage V chronic kidney disease status post fistula placement now on HD, hypertension, tobacco abuse. Presented 06/17/2014 with progressive SOB 2 weeks and lower extremity edema. 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 CT scan showed massive pneumoperitoneum and ascites and also pneumatosis of a short segment of the ileum. Pt underwent exploratory laparotomy evacuation of ascites with closure of perforated pyloric ulcer with omental patch and segmental resection of ileum 07/01/2014. G-J tube placed 5/11.  RD consulted for calorie count as well as a trial of another TF formula QHS. Of note, Glucerna and Nepro has been causing diarrhea. PO intake has been poor at 0-50%. RD to order nocturnal tube feeds with Jevity 1.2 formula and will monitor for tolerance. RD to additionally order [employer] as pt is tolerant of it.   RD to follow up tomorrow, 6/29, for day 1 results of calorie count.  Height:  Ht Readings from  Last 1 Encounters:  09/11/14 _0  (1.702 m)    Weight:  Wt Readings from Last 1 Encounters:  09/29/14 216 lb 7.9 oz (98.2 kg)    Ideal Body Weight:  61 kg  Wt Readings from Last 10 Encounters:  09/29/14 216 lb 7.9 oz (98.2 kg)  09/10/14 218 lb 3.2 oz (98.975 kg)  06/01/14 225 lb (102.059 kg)  01/28/14 230 lb (104.327 kg)  01/21/14 231 lb 1.6 oz (104.826 kg)  10/02/12 216 lb 8 oz (98.204 kg)    BMI:  Body mass index is 33.9 kg/(m^2).  Estimated Nutritional Needs:  Kcal:  2000-2200  Protein:  110-125 grams  Fluid:  Per MD  Skin:    Incision on abdomen, L arm, wound on lower abdomen, non-pitting UE edema, +2 LE edema  Diet Order:  DIET SOFT Room service appropriate?: Yes; Fluid consistency:: Thin  EDUCATION NEEDS:  No education needs identified at this time   Intake/Output Summary (Last 24 hours) at 09/29/14 0942 Last data filed at 09/29/14 0921  Gross per 24 hour  Intake    370 ml  Output      0 ml  Net    370 ml    Last BM:  6/27  Corrin Parker, MS, RD, LDN Pager # [phone] After hours/ weekend pager # [phone]

## 2014-09-29 NOTE — Progress Notes (Signed)
Subjective:  Back from central  line removal/ for hd today/ stated  She ate some burger family brought last night  Objective Vital signs in last 24 hours: Filed Vitals:   09/27/14 2042 09/28/14 0606 09/28/14 1341 09/29/14 0532  BP: 112/70 115/70 116/76 106/72  Pulse: 82 85 85 88  Temp:  97.4 F (36.3 C) 97.5 F (36.4 C) 98.5 F (36.9 C)  TempSrc:  Oral Oral Oral  Resp:  Height:      Weight:  97.1 kg (214 lb 1.1 oz)  98.2 kg (216 lb 7.9 oz)  SpO2:  94% 97% 95%   Weight change: 1.1 kg (2 lb 6.8 oz)  Physical Exam: General: Alert , less confused realizing hd later today / NAD Heart: RRR no mur , rub Or gallop Lung  CTA / non labored breathing Abdomen: soft ,PEG tub, dressing and binder in place Extremities: 1 + LE edema, and dependent throughout legs/hips Dialysis Access: maturing left AVF pos bruit , using right IJ perm cath    OP Dialysis Orders: new start this admission - plan to transfer to [facility] at d/c 6:45 am   Problem/Plan:  1. 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 ESRD - new start to HD, on TTS schedule,( op will be [facility]) using R IJ catheter; AVF placed 01/28/14 by Dr. Darrick Penna, s/p revision 6/8 Dr. Arbie Cookey.???  use  AVF early July- pre HD Na has i with hd Next HD.  3. CVA/ Deconditioning- on rehab  4. Hypotension 106/72 bp /on am  midodrine  And TTS HD days only - Hx severe RHF by echo- UF titrate down as able 5. Vol excess - needs more aggressive UF with HD- with Pedal Edema  6. Anemia - Hgb 10.6- 9.6 -> 9.8 , lab pending hd today / Aranesp 200 mcg on Thurs, started Fe 6/11.for tsat 23% 6/7- received 4 -125 doses - add weekly IV Fe 7. 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 Nutrition - Alb 2.2, soft diet, vitamin. Has Megace and Still had PEG tube 9. C diff colitis - now negative. 10. Anxiety - Xanax pre-HD 11. Other - needs nail care by podiatry after d/c  Lenny Pastel, PA-C [facility] Beeper [phone] 09/29/2014,11:43 AM  LOS: 18 days   Pt seen, examined and agree w A/P as above.  [name] MD pager [phone]    cell [phone] 09/29/2014, 12:28 PM    Labs: Basic Metabolic Panel:  Recent Labs Lab 09/22/14 1750 09/24/14 1603 09/26/14 1700  NA 131* 134* 143  K 4.9 4.6 4.3  CL 95* 99* 88*  CO2 GLUCOSE 46* 80 94  BUN 42* 28* 24*  CREATININE 6.32* 5.77* 5.09*  CALCIUM 8.4* 8.5* 8.2*  PHOS 8.6* 6.2* 5.4*   Liver Function Tests:  Recent Labs Lab 09/22/14 1750 09/24/14 1603 09/26/14 1700  ALBUMIN 2.3* 2.3* 2.2*     Recent Labs Lab 09/24/14 1604 09/26/14 1700  WBC 8.2 7.6  HGB 9.6* 9.8*  HCT 30.5* 31.3*  MCV 93.6 94.6  PLT 262 218   Cardiac Enzymes: No results for input(s): CKTOTAL, CKMB, CKMBINDEX, TROPONINI in the  last 168 hours. CBG: No results for input(s): GLUCAP in the last 168 hours.  Studies/Results: No results found. Medications: . feeding supplement (JEVITY 1.2 CAL)     . ALPRAZolam  0.5 mg Oral Q T,Th,Sa-HD  . [START ON 10/05/2014] amiodarone  200 mg Oral Daily  . amiodarone  400 mg Oral Daily  . aspirin  81 mg Oral Daily  . budesonide (PULMICORT) nebulizer solution  0.25 mg Nebulization BID  . calcitRIOL  0.5 mcg Oral Q T,Th,Sat-1800  . chlorhexidine  15 mL Mouth Rinse BID  . citalopram  20 mg Oral Daily  . collagenase   Topical Daily  . darbepoetin (ARANESP) injection - DIALYSIS  200 mcg Intravenous Q Thu-HD  . feeding supplement (PRO-STAT SUGAR FREE 64)  30 mL Per Tube BID BM  . feeding supplement (RESOURCE BREEZE)  1 Container Oral TID BM  . ferric gluconate (FERRLECIT/NULECIT) IV  125 mg Intravenous Once  . ipratropium  0.5 mg Nebulization TID  .  levalbuterol  0.63 mg Nebulization TID  . levothyroxine  25 mcg Oral QAC breakfast  . megestrol  400 mg Per Tube BID  . midodrine  10 mg Oral Q T,Th,Sa-HD  . midodrine  10 mg Oral QAC breakfast  . multivitamin  1 tablet Oral QHS  . sevelamer carbonate  2.4 g Oral TID  . vitamin C  250 mg Oral Daily

## 2014-09-29 NOTE — Progress Notes (Addendum)
Daily Therapy Note  14:00-14:30 C/o mild lower back pain- RN made aware  1:! Focus on sit to stand and functional ambulation from bed to bathroom with RW with steady A to close supervision. Pt able to perform toileting with mod A (A for clothing management after voiding). Pt ambulated to sink and performed hand hygiene with supervision in standing. Returned to EOB to [employer] clothing and don gown in prep fo HD. Pt required A for socks and shoes due to c/o swollen LEg and leg discomfort when performing tasks. A for both LEs to get into bed (mod A). Discussed goals and plan for family education tomorrow.

## 2014-09-29 NOTE — Progress Notes (Signed)
Occupational Therapy Session Note  Patient Details  Name: [name] MRN: [id number] Date of Birth: 10/20/1955  Today's Date: 09/29/2014 OT Individual Time: [id number] OT Individual Time Calculation (min): 59 min    Skilled Therapeutic Interventions/Progress Updates:    Pt performed toilet transfer with min assist using the RW.  She was able to perform all aspects of toileting with min assist as well.  Progressed to bathing sit to stand at the sink.  She was able to complete all UB bathing with supervision.  Planned procedure to OR so pt only donned hospital gown.  She was unable to reach either foot for dressing tasks by bringing her LEs up toward her or bending toward the floor.  May benefit from AE use.  Oxygen sats were 94-95% on room air post ambulation to the wheelchair for bathing.  Finished session with transfer to the bed with min assist as well.    Therapy Documentation Precautions:  Precautions Precautions: Fall Precaution Comments:  abdominal binder, abdominal wound, 2-3L 02 to maintain Sp02 >90% Other Brace/Splint: abdominal binder Restrictions Weight Bearing Restrictions: No   Pain: Pain Assessment Pain Assessment: 0-10 Pain Score: 7  Pain Type: Acute pain Pain Location: Abdomen Pain Orientation: Left Pain Descriptors / Indicators: Aching;Sore Pain Onset: On-going Patients Stated Pain Goal: 2 Pain Intervention(s): Ambulation/increased activity;Repositioned Multiple Pain Sites: No ADL: See FIM for current functional status  Therapy/Group: Individual Therapy  [name] OTR/L 09/29/2014, 12:38 PM

## 2014-09-30 ENCOUNTER — Encounter (HOSPITAL_COMMUNITY): Payer: Self-pay | Admitting: Occupational Therapy

## 2014-09-30 ENCOUNTER — Encounter (HOSPITAL_COMMUNITY): Payer: Medicaid Other | Admitting: Occupational Therapy

## 2014-09-30 ENCOUNTER — Inpatient Hospital Stay (HOSPITAL_COMMUNITY): Payer: Self-pay | Admitting: Physical Therapy

## 2014-09-30 ENCOUNTER — Inpatient Hospital Stay (HOSPITAL_COMMUNITY): Payer: Self-pay | Admitting: Occupational Therapy

## 2014-09-30 ENCOUNTER — Ambulatory Visit (HOSPITAL_COMMUNITY): Payer: Self-pay | Admitting: Physical Therapy

## 2014-09-30 MED ORDER — MIDODRINE HCL 5 MG PO TABS
10.0000 mg | ORAL_TABLET | Freq: Two times a day (BID) | ORAL | Status: DC
  Administered 2014-09-30 – 2014-10-02 (×4): 10 mg via ORAL
  Filled 2014-09-30 (×6): qty 2

## 2014-09-30 NOTE — Progress Notes (Addendum)
Occupational Therapy Session Note  Patient Details  Name: [name] MRN: [id number] Date of Birth: 08-Dec-1955  Today's Date: 09/30/2014 OT Individual Time: 1000-1027 OT Individual Time Calculation (min): 27 min    Skilled Therapeutic Interventions/Progress Updates:    Performed family education with daughter on shower tub transfers and need for DME for the tub once pt is cleared to shower.  Pt's daughter able to return demonstrate functional assist with transfer into and out of the tub.  Min assist for lifting the LEs over the edge of the tub going in and then supervision to bring them back out.  Discussed the need for hand held shower, tub bench, and mat in the tub as well.  Also instructed daughter to remove all throw rugs as well since pt is using the RW.  Mod instructional cueing for foot placement and setup for sit to stand from the wheelchair.  Discussed the similarity of transfer to the toilet as well.    Session 2:  (11:15-12:00) Pt performed bathing and dressing sit to stand at the sink.  Min assist for sit to stand during LB bathing and dressing.  Pt was able to complete UB bathing and dressing with supervision.  She was able to maintain standing while washing peri area and donning new brief with min assist for 3-4 mins.  Demonstrated AE for LB selfcare as pt exhibits decreased ability to reach either foot for dressing or bathing tasks.  Pt did not attempt use as she declined removing shoes and washing feet this session.  Discussed where AE could be purchased as well and estimated cost.  Pt finished session with standing interval for 2-3 mins with min assist.  Pt left in wheelchair with daughter present.   Therapy Documentation Precautions:  Precautions Precautions: Fall Precaution Comments:  abdominal binder, abdominal wound, 2-3L 02 to maintain Sp02 >90% Required Braces or Orthoses: Other Brace/Splint Other Brace/Splint: abdominal binder Restrictions Weight Bearing  Restrictions: No  Pain: Pain Assessment Pain Assessment: 0-10 Pain Score: 7  Faces Pain Scale: Hurts little more Pain Type: Chronic pain Pain Location: Abdomen Pain Orientation: Left Pain Onset: On-going Patients Stated Pain Goal: 2 Pain Intervention(s): Repositioned;Ambulation/increased activity Multiple Pain Sites: No ADL: See FIM for current functional status  Therapy/Group: Individual Therapy  Nealie Mchatton OTR/L 09/30/2014, 11:13 AM Pt

## 2014-09-30 NOTE — Progress Notes (Signed)
Social Work [name], female   DOB: 20-Mar-1956, 59 y.o.   MRN: [id number] Met with pt and daughter who was here for family education and learning pt's care at home. Both expressed it went well and daughter reports either she or her brother will be with pt at home. Pt wants A rollator PT to see how safe she is with tomorrow when she see's her. PT feels it will not be safe for her. Both do not want a rental wheelchair and tube feeding either. Pt states: " I'm not Using that tube feeding at home, I'm not going to dialysis with diarrhea, I will eat better at home."  Will not order tube feedings or pump due to pt does not plan to use once home. Daughter plans to contact [facility] to obtain paperwork to be completed before dialysis on Sat. Will continue to work on discharge plans. Both are aware of the team conference progression of goals to supervision level and discharge 7/1.

## 2014-09-30 NOTE — Progress Notes (Signed)
Physical Therapy Session Note  Patient Details  Name: [name] MRN: [id number] Date of Birth: 11-12-1955  Today's Date: 09/30/2014 PT Individual Time: 0900-1000 PT Individual Time Calculation (min): 60 min   Short Term Goals: Week 3:  PT Short Term Goal 1 (Week 3): STG = LTG due to length of stay  Skilled Therapeutic Interventions/Progress Updates:    Pt received seated in w/c with daughter present; c/o pain as described below and agreeable to treatment. Performed ascent/descent of 12 stairs with B handrails and minA/CGA with daughter assisting. Pt requires max cues for encouragement to complete all 12 stairs, however is able to perform with adequate speed and safely. Performed gait x15' into bathroom with RW and minA/CGA to enter restroom. Performed stand pivot transfer to raised toilet seat with supervision. Gait to return to w/c approx 10' with supervision and RW. Pt instructed in seated LE strengthening exercises with pt cued to recall exercises performed in previous session; able to recall 3 of 5 commonly performed exercises with min cues for other 2 exercises. Education with daughter and pt performed throughout session. Encouraged pt and daughter to arrange home to allow pt to perform short walks with options for sitting rest breaks throughout the house. Additionally, daughter and pt report a w/c will not fit functionally in their apartment; would prefer a rollator for home and community ambulation to have seat to rest on. Discussed pts poor posture and likelihood of losing control of rollator, however pt and daughter are insistant on trying; plan to trial next session. Pt remained seated in w/c with all needs within reach and handoff to OT for next session.  Therapy Documentation Precautions:  Precautions Precautions: Fall Precaution Comments:  abdominal binder, abdominal wound, 2-3L 02 to maintain Sp02 >90% Other Brace/Splint: abdominal binder Restrictions Weight Bearing  Restrictions: No Vital Signs: Oxygen Therapy SpO2: 96 % O2 Device: Not Delivered Pain: Pain Assessment Pain Assessment: 0-10 Pain Score: 7  Faces Pain Scale: Hurts little more Pain Type: Chronic pain Pain Location: Abdomen Pain Orientation: Left Pain Onset: On-going Patients Stated Pain Goal: 2 Pain Intervention(s): Repositioned;Ambulation/increased activity Multiple Pain Sites: No Locomotion : Ambulation Ambulation/Gait Assistance: 4: Min guard   See FIM for current functional status  Therapy/Group: Individual Therapy  Vista Lawmanlizabeth J Tygielski 09/30/2014, 10:53 AM

## 2014-09-30 NOTE — Patient Care Conference (Signed)
Inpatient RehabilitationTeam Conference and Plan of Care Update Date: 09/30/2014   Time: 10;45 AM    Patient Name: [name]      Medical Record Number: [id number]  Date of Birth: 28-Oct-1955 Sex: Female         Room/Bed: 4W17C/4W17C-01 Payor Info: Payor: MEDICAID [place] / Plan: MEDICAID [place] ACCESS / Product Type: *No Product type* /    Admitting Diagnosis: R CVA  Admit Date/Time:  09/11/2014  4:43 PM Admission Comments: No comment available   Primary Diagnosis:  Acute right ACA stroke Principal Problem: Acute right ACA stroke  Patient Active Problem List   Diagnosis Date Noted  . Chronic ischemic colitis 09/25/2014  . Acute right ACA stroke 09/11/2014  . Left hemiparesis 09/11/2014  . History of ETT   . Duodenal fistula   . Sepsis   . Aortic aneurysm 08/13/2014  . Protein-calorie malnutrition   . Itching 08/12/2014  . Duodenal anastomotic leak   . Protein calorie malnutrition   . SVT (supraventricular tachycardia)   . Right heart failure   . ESRD (end stage renal disease)   . Abdominal pain   . Palliative care encounter   . 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 Fistula   . History of intermediate V/Q scan   . Other emphysema   . Intra-abdominal abscess   . Abnormal TSH   . Enteritis due to Clostridium difficile 07/19/2014  . Abdominal abscess   . Pelvic fluid collection   . Perforated gastric ulcer 07/01/2014  . SOB (shortness of breath)   . PJRT (permanent junctional reciprocating tachycardia)   . Paroxysmal atrial tachycardia   . PSVT (paroxysmal supraventricular tachycardia)   . Stroke   . Hyperlipidemia   . 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 Hypertension 06/20/2014  . ESRD needing dialysis 06/20/2014  . Obesity (BMI 30-39.9) 06/20/2014  . Depression 06/20/2014  . Left renal mass 06/20/2014  . Chronic diastolic heart failure 06/20/2014  . H1N1 influenza 06/20/2014  . Tobacco use disorder 06/20/2014  . ARF (acute  renal failure)   . Renal failure (ARF), acute on chronic   . Acute respiratory failure with hypoxia 06/17/2014  . End stage renal disease 01/21/2014    Expected Discharge Date: Expected Discharge Date: 10/02/14  Team Members Present: Physician leading conference: Dr. Claudette Laws Social Worker Present: Dossie Der, LCSW Nurse Present: Carmie End, RN PT Present: Wanda Plump, PT;Other (comment) Luanne Bras Tygielski-PT) OT Present: Primitivo Gauze, OT SLP Present: Jackalyn Lombard, SLP PPS Coordinator present : Tora Duck, RN, CRRN     Current Status/Progress Goal Weekly Team Focus  Medical   off IVF , still requiring TF due to poor intake  12 steps with 1 person assisst  family training   Bowel/Bladder   Continent of bowel and bladder. loose stool x2 6/28  Time toileting q 3 hrs. Lomotil PRN  Lomotil PRN   Swallow/Nutrition/ Hydration     na        ADL's   min assist for bathing with supervision for UB bathing and mod assist for LB dressing sit to stand.  Min assist for transfers with selfcare tasks, limited enurance/activity tolerance.   supervision overall  activity tolerance, selfcare re-training, transfer training, DME education, pt/family education, energy conservation   Mobility   minA/supervision with transfers/gait, min/mod stairs, severely reduced activity tolerance  supervision/minA overall gait, transfers, and w/c mobility  stairs, gait and activity tolerance, LE HEP, family education/training   Communication     na  Safety/Cognition/ Behavioral Observations    no unsafe behaviors        Pain   Oxy IR 5 mg PRN  Pain level<3  assess pain q shift and PRN prior to therapy sessions   Skin   adb incision W-D drsg changes BID  Monitor for s/s of infection, educate patient/caregiver on dressing changes, and educate patient on s/s of infections  Continue dressing changes as ordered, and continue to assess      *See Care Plan and progress notes for long and  short-term goals.  Barriers to Discharge: depressed, multiple medical issues,     Possible Resolutions to Barriers:  cont d/c planning    Discharge Planning/Teaching Needs:  Family education today with daughter, see how goes. Preparing for discharge Friday      Team Discussion:  Family education today with daughter. Tube feedings 8p-2a to supplement her po intake. Did 10 steps today in therapy. Chronic bowel issues. Abdominal wound looks better healing-BID dressing changes. Preparing for DC Friday  Revisions to Treatment Plan:  DC Friday   Continued Need for Acute Rehabilitation Level of Care: The patient requires daily medical management by a physician with specialized training in physical medicine and rehabilitation for the following conditions: Daily direction of a multidisciplinary physical rehabilitation program to ensure safe treatment while eliciting the highest outcome that is of practical value to the patient.: Yes Daily medical management of patient stability for increased activity during participation in an intensive rehabilitation regime.: Yes Daily analysis of laboratory values and/or radiology reports with any subsequent need for medication adjustment of medical intervention for : Neurological problems;Other  Cashus Halterman, Lemar LivingsRebecca G 10/01/2014, 8:55 AM
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
## 2014-09-30 NOTE — Progress Notes (Signed)
Day 1 of 2-Calorie Count Note  48 hour calorie count ordered.  Diet: Soft diet Supplements: Resource Breeze po TID, each supplement provides 250 kcal and 9 grams of protein.  Breakfast: none recorded Lunch: 175 kcal, 10 grams of protein Dinner: none recorded, however meal completion 90% Supplements: 500 kcal, 18 grams of protein  Total intake: 675 kcal (34% of minimum estimated needs)  28 grams of protein (25% of minimum estimated needs)  Patient's PO intake alone is not meeting nutritional needs, however current po intake along with nocturnal tube feeds is meeting her estimated nutrition needs. Pt reports she has been trying to improve on her PO intake. Per RN, nocturnal tube feeds have been tolerated well with new formula. RD to follow up tomorrow with day 2 results.   Estimated Nutritional Needs: Kcal: 2000-2200 Protein: 110-125 grams Fluid: Per MD   Nutrition Dx: Increased nutrient needs related to chronic illness as evidenced by estimated needs; ongoing  Goal:  Pt to meet >/= 90% of their estimated nutrition needs; progressing  Intervention:   Continue Calorie Count- RD to follow up tomorrow 6/30 for day 2 results.  Continue Resource Breeze po TID, each supplement provides 250 kcal and 9 grams of protein  Continue nocturnal tube feeds from 8pm-8am (12 hours) of Jevity 1.2 formula via J-tube at goal rate of 70 ml/hr with 30 ml Prostat BID per tube to provide 1208 kcal (60% of kcal needs), 77 grams of protein (70% of protein needs), and 680 ml of free water.   Roslyn SmilingStephanie Calena Salem, MS, RD, LDN Pager # [phone] After hours/ weekend pager # [phone]

## 2014-09-30 NOTE — Progress Notes (Signed)
Occupational Therapy Session Note  Patient Details  Name: Stephanie Mikeal HawthorneC Pain MRN: [id number] Date of Birth: 06/25/1955  Today's Date: 09/30/2014 OT Individual Time: 1115-1200 OT Individual Time Calculation (min): 45 min    Skilled Therapeutic Interventions/Progress Updates:    1:1 focus on functional ambulation to bathroom with RW with VC for maintaining forward posture. Pt able to demonstrate toileting with steady and more than reasonable amt of time for clothing management and A to obtain supplies. Pt with c/o upset stomach and has had 3 bouts of loose stool today. RN made aware of discomfort. Pt requested to stay close to room this session. Focus on practicing with AE including reacher, sock aid and long handled shoe horn. Pt able to doff pants and thread them with more than reasonable amt of time with reacher. Pt issued elastic shoe laces to increased independence with donning shoes. Pt able to use sock aide with extra time and instructional cues. After practicing donning clothing, pt doffed clothing with AE and donned gown. Pt returned to EOB and was left with nursing.   Therapy Documentation Precautions:  Precautions Precautions: Fall Precaution Comments:  abdominal binder, abdominal wound, 2-3L 02 to maintain Sp02 >90% Required Braces or Orthoses: Other Brace/Splint Other Brace/Splint: abdominal binder Restrictions Weight Bearing Restrictions: No Pain:  c/o bottom and stomach pain   See FIM for current functional status  Therapy/Group: Individual Therapy  Roney MansSmith, Delene Morais [facility] 09/30/2014, 2:54 PM

## 2014-09-30 NOTE — Progress Notes (Signed)
Subjective:  No cos / tolerated HD yest/ Daughter in room / drinking cola and Cheese tips at bedside from home  Objective Vital signs in last 24 hours: Filed Vitals:   09/29/14 2115 09/30/14 0614 09/30/14 0810 09/30/14 0815  BP:  92/55    Pulse: 88 92    Temp:  98.6 F (37 C)    TempSrc:  Oral    Resp: 18 18    Height:      Weight:  97.1 kg (214 lb 1.1 oz)    SpO2:  95% 96% 96%   Weight change: -5.6 kg (-12 lb 5.5 oz)   Physical Exam: General: Alert  OX3 / NAD Heart: RRR no mur , rub Or gallop Lung CTA / non labored breathing Abdomen: soft ,PEG tub, dressing and binder in place Extremities: 1 + bipedal  LE edema,  Dialysis Access: maturing left AVF pos bruit , right IJ perm cath   OP Dialysis Orders: new start this admission - plan to transfer to [facility] TTS at d/c 6:45 am   Problem/Plan:  1. 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 ESRD - new start to HD, on TTS schedule,( op will be [facility] tts) using R IJ catheter; AVF placed 01/28/14 by Dr. Darrick Penna, s/p revision 09/09/14 Dr. Arbie Cookey possible  use AVF early July- / na 130<143  back down pre hd  yest  With uf was seeing improvement  Fu labs 3. CVA/ Deconditioning- on rehab  4. Hypotension/Vol excess  92 /55 am  bp /on  q am midodrine And TTS HD days only - Hx severe RHF by echo- will incr to bid  To help control bp on hd ,needing more vol off  With HD Will lower BP threshold w HD to > 80, get more vol off. Has an MRA from March with no sig large vessel disease in the head/ neck 5. Anemia - Hgb  9.9 ,  / Aranesp 200 mcg on Thurs, started Fe 6/11.for tsat 23% 6/7- received 4 -125 doses - add weekly IV Fe 6. Sec HPT - Ca 8.0 corrected around 9.6, Phos 6.2>5.4> 4.5 improving; Calcitriol 0.5 mcg- also on renvela powder 2 .4grm ac may not be helping GI issues  although renvela is usually constipating;but none mentioned has diarrhea  some/ last iPTH 346 6/7  DW pt and daughter high Phos foods with Dark cola and cheese chips at bedside 7. Nutrition - Alb 2.1, soft diet, renal vitamin. Has Megace  Appears to have sl incr appetite as eating more and foods from home/ Still had PEG tube with feedings  8. C diff colitis - now negative. 9. Anxiety - Xanax pre-HD 10. Other - needs nail care by podiatry after d/c   Lenny Pastel, PA-C [facility] Beeper [phone] 09/30/2014,10:40 AM  LOS: 19 days   Pt seen, examined, agree w assess/plan as above with additions as indicated.  [name] MD pager [phone]    cell [phone] 09/30/2014, 1:49 PM     Labs: Basic Metabolic Panel:  Recent Labs Lab 09/24/14 1603 09/26/14 1700 09/29/14 1521  NA 134* 143 130*  K 4.6 4.3 4.0  CL 99* 88* 96*  CO2 GLUCOSE 80 94 104*  BUN 28* 24* 40*  CREATININE 5.77* 5.09* 5.91*  CALCIUM 8.5* 8.2*  8.0*  PHOS 6.2* 5.4* 4.5   Liver Function Tests:  Recent Labs Lab 09/24/14 1603 09/26/14 1700 09/29/14 1521  ALBUMIN 2.3* 2.2* 2.1*   No results for input(s): LIPASE, AMYLASE in the last 168 hours. No results for input(s): AMMONIA in the last 168 hours. CBC:  Recent Labs Lab 09/24/14 1604 09/26/14 1700 09/29/14 1520  WBC 8.2 7.6 8.4  HGB 9.6* 9.8* 9.9*  HCT 30.5* 31.3* 31.5*  MCV 93.6 94.6 92.1  PLT 262 218 259   Medications: . feeding supplement (JEVITY 1.2 CAL) 1 mL/hr (09/29/14 2015)   . ALPRAZolam  0.5 mg Oral Q T,Th,Sa-HD  . [START ON 10/05/2014] amiodarone  200 mg Oral Daily  . amiodarone  400 mg Oral Daily  . aspirin  81 mg Oral Daily  . budesonide (PULMICORT) nebulizer solution  0.25 mg Nebulization BID  . calcitRIOL  0.5 mcg Oral Q T,Th,Sat-1800  . chlorhexidine  15 mL Mouth Rinse BID  . citalopram  20 mg Oral Daily  . collagenase   Topical Daily  . darbepoetin (ARANESP) injection - DIALYSIS  200 mcg Intravenous  Q Thu-HD  . feeding supplement (PRO-STAT SUGAR FREE 64)  30 mL Per Tube BID BM  . feeding supplement (RESOURCE BREEZE)  1 Container Oral TID BM  . ferric gluconate (FERRLECIT/NULECIT) IV  125 mg Intravenous Once  . ipratropium  0.5 mg Nebulization TID  . levalbuterol  0.63 mg Nebulization TID  . levothyroxine  25 mcg Oral QAC breakfast  . megestrol  400 mg Per Tube BID  . midodrine  10 mg Oral Q T,Th,Sa-HD  . midodrine  10 mg Oral QAC breakfast  . multivitamin  1 tablet Oral QHS  . sevelamer carbonate  2.4 g Oral TID  . vitamin C  250 mg Oral Daily

## 2014-10-01 ENCOUNTER — Inpatient Hospital Stay (HOSPITAL_COMMUNITY): Payer: Self-pay | Admitting: Occupational Therapy

## 2014-10-01 ENCOUNTER — Inpatient Hospital Stay (HOSPITAL_COMMUNITY): Payer: Self-pay | Admitting: Physical Therapy

## 2014-10-01 LAB — BASIC METABOLIC PANEL
Anion gap: 11 (ref 5–15)
BUN: 38 mg/dL — ABNORMAL HIGH (ref 6–20)
CALCIUM: 8.4 mg/dL — AB (ref 8.9–10.3)
CO2: 27 mmol/L (ref 22–32)
Chloride: 96 mmol/L — ABNORMAL LOW (ref 101–111)
Creatinine, Ser: 5.41 mg/dL — ABNORMAL HIGH (ref 0.44–1.00)
GFR calc Af Amer: 9 mL/min — ABNORMAL LOW (ref >60)
GFR calc non Af Amer: 8 mL/min — ABNORMAL LOW (ref >60)
GLUCOSE: 94 mg/dL (ref 65–99)
Potassium: 4.1 mmol/L (ref 3.5–5.1)
Sodium: 134 mmol/L — ABNORMAL LOW (ref 135–145)

## 2014-10-01 LAB — CBC
HEMATOCRIT: 30.9 % — AB (ref 36.0–46.0)
HEMOGLOBIN: 9.7 g/dL — AB (ref 12.0–15.0)
MCH: 28.9 pg (ref 26.0–34.0)
MCHC: 31.4 g/dL (ref 30.0–36.0)
MCV: 92 fL (ref 78.0–100.0)
Platelets: 276 10*3/uL (ref 150–400)
RBC: 3.36 MIL/uL — ABNORMAL LOW (ref 3.87–5.11)
RDW: 18.2 % — ABNORMAL HIGH (ref 11.5–15.5)
WBC: 8.3 10*3/uL (ref 4.0–10.5)

## 2014-10-01 MED ORDER — DARBEPOETIN ALFA 200 MCG/0.4ML IJ SOSY
PREFILLED_SYRINGE | INTRAMUSCULAR | Status: AC
  Filled 2014-10-01: qty 0.4

## 2014-10-01 NOTE — Discharge Instructions (Signed)
Inpatient Rehab Discharge Instructions  [name] Discharge date and time: No discharge date for patient encounter.   Activities/Precautions/ Functional Status: Activity: activity as tolerated Diet: Mechanical soft renal diet Wound Care: keep wound clean and dry Functional status:  ___ No restrictions     ___ Walk up steps independently ___ 24/7 supervision/assistance   ___ Walk up steps with assistance ___ Intermittent supervision/assistance  ___ Bathe/dress independently ___ Walk with walker     ___ Bathe/dress with assistance ___ Walk Independently    ___ Shower independently _x__ Walk with assistance    ___ Shower with assistance ___ No alcohol     ___ Return to work/school ________  Special Instructions: Continue hemodialysis as directed   Normal saline wet-to-dry dressings to midline abdominal wound with 4 x 4 gauze twice a day. Paper tape only  Santyl to inferior aspect of open abdominal wound where it tunnels deeper, place on fibrinous thick yellow granulation tissue only  Abdominal binder at all times   COMMUNITY REFERRALS UPON DISCHARGE:    Home Health:   PT, OT, RN  Agency:[facility] [phone]   Date of last service:10/02/2014   Medical Equipment/Items Ordered:BEDSIDE COMMODE, TUB BENCH & ROLLATOR Levan HurstROLLING WALKER  Agency/Supplier:[facility]   [phone]   GENERAL COMMUNITY RESOURCES FOR PATIENT/FAMILY: Support Groups:[facility] SUPPORT GROUP  My questions have been answered and I understand these instructions. I will adhere to these goals and the provided educational materials after my discharge from the hospital.  Patient/Caregiver Signature _______________________________ Date __________  Clinician Signature _______________________________________ Date __________  Please bring this form and your medication list with you to all your follow-up doctor's appointments.

## 2014-10-01 NOTE — Discharge Summary (Signed)
[name], [name]                 ACCOUNT NO.:  [id number]  MEDICAL RECORD NO.:  [id number]  LOCATION:  [id number]                        FACILITY:  [facility]  PHYSICIAN:  Erick Colace, M.D.DATE OF BIRTH:  December 17, 1955  DATE OF ADMISSION:  08/17/2014 DATE OF DISCHARGE:  10/02/2014                              DISCHARGE SUMMARY   DISCHARGE DIAGNOSES: 1. 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 2. Sequential compression devices for deep venous thrombosis     prophylaxis. 3. Pain management. 4. End-stage renal disease with hemodialysis. 5. Skin wounds. 6. History of Clostridium difficile. 7. Hypertension with intermittent bouts of paroxysmal supraventricular     tachycardia. 8. Chronic ischemic colitis. 9. Hypothyroidism. 10.Anxiety.  HISTORY OF PRESENT ILLNESS:  This is a 59 year old right-handed female, multi medical stage 5 chronic kidney disease with fistula followed by Nephrology at [facility], [name] who was independent prior to admission living with her daughter, presented on June 17, 2014, [facility] with progressive shortness of breath x2 weeks and lower extremity edema.  By report, patient stopped taking her diuretics 2 weeks prior.  Findings of elevated creatinine 5.5, from baseline 2.64. Also noted poor appetite, upper respiratory infection.  Chest x-ray showed no infiltrate or failure.  Renal ultrasound, no hydronephrosis. Reported some nonspecific left-sided weakness.  Cranial CT scan showed acute infarct right anterior cerebral artery territory.  Nephrology Services consulted, placed on intravenous Lasix.  MRI of the brain, on June 20, 2014 showed multifocal areas of acute infarct throughout both hemispheres with the largest involving the right medial parasagittal frontal and posterior frontal cortex.  MRA with no large vessel occlusion.  Carotid Dopplers with no ICA stenosis.   Echocardiogram with ejection fraction of 65%, grade 1 diastolic dysfunction.  Placed on aspirin therapy.  TEE performed showing normal LV function.  No thrombus.  Positive bubble study indicating a small PFO.  Venous Doppler studies negative.  Underwent placement of loop recorder per Dr. Graciela Husbands on June 25, 2014.  Renal function monitored.  Hemodialysis initiated, placement of left arm AV fistula.  Patient developed acute abdominal pain on July 01, 2014.  CT showed massive pneumoperitoneum and ascites. General Surgery consulted, underwent exploratory laparotomy, evacuation of ascites with closure of perforated pyloric ulcer, segmental resection per Dr. Derrell Lolling.  The patient required ventilatory support.  Wound care as advised.  Contact precautions for Clostridium difficile.  Followup CT of abdomen showed no signs of abscess.  Interventional Radiology placed gastrostomy tube for nutritional support and diet slowly advanced.  The patient was admitted for comprehensive rehab program.  PAST MEDICAL HISTORY:  See discharge diagnoses.  SOCIAL HISTORY:  Lives with family independent prior to admission.  Functional status upon admission to rehab services was minimal assist, supine to sit; minimal assist, scoot to the edge of the bed; mod max assist, activities of daily living; ambulating 82 feet minimal assistance.  PHYSICAL EXAMINATION:  VITAL SIGNS:  Blood pressure 100/58, pulse 100, temperature 98, respirations 17. GENERAL:  This was an alert female, oriented to person, place, date of birth.  Generalized weakness. LUNGS:  Clear to auscultation. HEART:  Cardiac  rate controlled. ABDOMEN:  Soft, nontender.  Good bowel sounds.  G-tube site clean and dry.  Exploratory laparotomy site with dressing in place.  REHABILITATION HOSPITAL COURSE:  Patient was admitted to inpatient rehab services with therapies initiated on a 3-hour daily basis consisting of physical therapy, occupational therapy,  and rehabilitation nursing.  The following issues were addressed during patient's rehabilitation stay. Pertaining to [name] right ACA infarct, she had undergone loop recorder.  She would follow up Cardiology Services.  She remained on low- dose aspirin therapy.  Venous Doppler studies of lower extremities negative.  Hemodialysis ongoing as per Renal Services.  A left arm AV fistula in place.  She would follow up with Dr. Arbie CookeyEarly as needed.  She had undergone exploratory laparotomy as followed by Dr. Derrell LollingIngram.  Wound care as advised.  Abdominal binder was in place.  Home health nurse had been arranged for dressing care as recommended by General Surgery. Patient remained afebrile.  Her diet was slowly advanced to a mechanical soft.  NG tube was in place by Interventional Radiology on Aug 12, 2014, for nutritional support.  The patient received weekly collaborative interdisciplinary team conferences to discuss estimated length of stay, family teaching, and any barriers to discharge.  Patient performs stairs with bilateral hand rails, minimal guard, contact assist with daughter assisting.  Fatigue factors continued to improve.  Ambulate 15 feet, rolling walker, minimal assist, contact guard.  Perform stand pivot transfers to a raised toilet seat with supervision.  Her ambulation at times was supervision pending endurance.  Activities of daily living and homemaking demonstrates functional assist with transfers into and out of the tub minimal assist for lifting lower extremities over the edge of the tub.  Perform bathing, dressing, sit to stand at the sink.  Full family teaching was completed and plan, discharge to home.  DISCHARGE MEDICATIONS:  Included: 1. Xanax 0.5 mg Tuesday, Thursday, Saturday with hemodialysis. 2. Amiodarone, patient currently on 400 mg daily x7 doses and 200 mg     daily beginning on October 05, 2014. 3. Aspirin 81 mg p.o. daily. 4. Rocaltrol 0.5 mcg Tuesday, Thursday,  Saturday. 5. Celexa 20 mg p.o. daily. 6. Aranesp with hemodialysis. 7. Jevity tube feeds, 70 mL, 12 hours. 8. Synthroid 25 mcg p.o. daily. 9. Midodrine 10 mg p.o. b.i.d. 10.Rena-Vite 1 tablet p.o. at bedtime. 11.Oxycodone immediate release 5 mg every 6 hours as needed pain,     dispense of 90 tablets. 12.Renvela 2.4 mg p.o. t.i.d. 13.Vitamin C 250 mg p.o. daily.  Her diet was mechanical soft.  Renal restrictions.  Patient will follow up with Dr. Claudette LawsAndrew Kirsteins at the outpatient rehab center as needed; Dr. Graciela HusbandsKlein, Cardiology Service call for appointment; Dr. Claud KelpHaywood Ingram 2 weeks; Dr. Delia HeadyPramod Sethi, call for appointment in 1 month; Dr. Annie SableKellie Goldsborough, Renal Services; Dr. Arbie CookeyEarly, Vascular Surgery call for appointment.  SPECIAL INSTRUCTIONS:  Wound care.  Normal saline.  Wet-to-dry dressings to midline abdominal wound with a 4x4 gauze, twice a day, paper tape only.  Santyl to inferior aspect of open abdominal wound where it tunnels deeper, placed on fibrinous thick yellow granulation tissue only, abdominal binder at all times.  Patient will continue dialysis as recommended.     Mariam Dollaraniel Angiulli, P.A.   ______________________________ Erick ColaceAndrew E. Kirsteins, M.D.    DA/MEDQ  D:  10/01/2014  T:  10/01/2014  Job:  [id number]  cc:   Cecille AverKellie A. Goldsborough, M.D. [name] L. August Saucerean, M.D. Pramod P. Pearlean BrownieSethi, MD Duke SalviaSteven C. Klein, MD, [facility] Estrella [facility] CenterFACC  Angelia Mould. Derrell Lolling, M.D.

## 2014-10-01 NOTE — Progress Notes (Signed)
[name] Progress Note  Assessment/Plan: 1. Pyloric ulcer perf s/p exlap complicated by duod fistula= which has healed over- waxing and waning symptoms. Has PEG feeding tube. Appetite improving.  2. ESRD - new start to HD, on TTS schedule,( op will be [facility] tts) using R IJ catheter; AVF placed 01/28/14 by Dr. Darrick Penna, s/p revision 09/09/14 Dr. Arbie Cookey possible  use AVF early July- / na 130<143 back down pre hd yest With uf was seeing improvement Fu labs 3. CVA/ Deconditioning- on rehab  4. Hypotension/Vol excess:  BP stable. SBP 100-117s.For HD today.  Will lower SBP parameters to 80 to assist in excess volume removal. Wt. 91.08 kg  5. Sec HPT - Ca 8.0 corrected around 9.6, Phos 6.2>5.4> 4.5 improving; Calcitriol 0.5 mcg- also on renvela powder 2 .4grm ac may not be helping GI issues although renvela is usually constipating;but none mentioned has diarrhea.  Needs visit with dietician for discussion of sources of dietary phos.     6. Nutrition - Alb 2.1, soft diet, renal vitamin. Has Megace Appears to have sl incr appetite as eating more and foods from home/ Still had PEG tube with feedings  7. C diff colitis - now negative. 8. Anxiety - Xanax pre-HD 9. Other - needs nail care by podiatry after d/c  Dene Gentry. Brown NP-C 10/01/2014, 12:57 PM  Wallis Kidney [name] [phone]  Pt seen, examined and agree w A/P as above.  [name] MD pager [phone]    cell [phone] 10/01/2014, 4:57 PM    Subjective:     Objective Filed Vitals:   09/30/14 0815 09/30/14 1441 10/01/14 0600 10/01/14 0740  BP:  117/79 101/63   Pulse:  91 84   Temp:  98.1 F (36.7 C) 98.5 F (36.9 C)   TempSrc:  Oral Oral   Resp:  20 19   Height:      Weight:   91.082 kg (200 lb 12.8 oz)   SpO2: 96% 100% 100% 90%   Physical Exam General: Well nourished female in NAD Heart: S1, S2 II/VI systolic M  Lungs: decreased in bases, clear anteriorly and posteriorly Abdomen:  obese, soft, nontender. Peg tube in place. clamped Extremities: 2+ LE edema. Pulses intact. Dialysis Access: R. Perm cath/LUA AVF + Bruit  Dialysis Orders: new start this admission - plan to transfer to [facility] TTS at d/c 6:45 am   Additional Objective Labs: Basic Metabolic Panel:  Recent Labs Lab 09/24/14 1603 09/26/14 1700 09/29/14 1521  NA 134* 143 130*  K 4.6 4.3 4.0  CL 99* 88* 96*  CO2 GLUCOSE 80 94 104*  BUN 28* 24* 40*  CREATININE 5.77* 5.09* 5.91*  CALCIUM 8.5* 8.2* 8.0*  PHOS 6.2* 5.4* 4.5   Liver Function Tests:  Recent Labs Lab 09/24/14 1603 09/26/14 1700 09/29/14 1521  ALBUMIN 2.3* 2.2* 2.1*   No results for input(s): LIPASE, AMYLASE in the last 168 hours. CBC:  Recent Labs Lab 09/24/14 1604 09/26/14 1700 09/29/14 1520  WBC 8.2 7.6 8.4  HGB 9.6* 9.8* 9.9*  HCT 30.5* 31.3* 31.5*  MCV 93.6 94.6 92.1  PLT 262 218 259   Blood Culture    Component Value Date/Time   SDES ABSCESS PELVIS 08/17/2014 1451   SPECREQUEST NONE 08/17/2014 1451   CULT  08/17/2014 1451    NO GROWTH 3 DAYS Performed at [facility]    REPTSTATUS 08/21/2014 FINAL 08/17/2014 1451    Cardiac Enzymes: No results for input(s): CKTOTAL, CKMB,  CKMBINDEX, TROPONINI in the last 168 hours. CBG: No results for input(s): GLUCAP in the last 168 hours. Iron Studies: No results for input(s): IRON, TIBC, TRANSFERRIN, FERRITIN in the last 72 hours. @lablastinr3 @ Studies/Results: No results found. Medications: . feeding supplement (JEVITY 1.2 CAL) 1,000 mL (09/30/14 2029)   . ALPRAZolam  0.5 mg Oral Q T,Th,Sa-HD  . [START ON 10/05/2014] amiodarone  200 mg Oral Daily  . amiodarone  400 mg Oral Daily  . aspirin  81 mg Oral Daily  . budesonide (PULMICORT) nebulizer solution  0.25 mg Nebulization BID  . calcitRIOL  0.5 mcg Oral Q T,Th,Sat-1800  . chlorhexidine  15 mL Mouth Rinse BID  . citalopram  20 mg Oral Daily  . collagenase   Topical Daily  . darbepoetin (ARANESP)  injection - DIALYSIS  200 mcg Intravenous Q Thu-HD  . feeding supplement (PRO-STAT SUGAR FREE 64)  30 mL Per Tube BID BM  . feeding supplement (RESOURCE BREEZE)  1 Container Oral TID BM  . ferric gluconate (FERRLECIT/NULECIT) IV  125 mg Intravenous Once  . ipratropium  0.5 mg Nebulization TID  . levalbuterol  0.63 mg Nebulization TID  . levothyroxine  25 mcg Oral QAC breakfast  . megestrol  400 mg Per Tube BID  . midodrine  10 mg Oral BID WC  . multivitamin  1 tablet Oral QHS  . sevelamer carbonate  2.4 g Oral TID  . vitamin C  250 mg Oral Daily

## 2014-10-01 NOTE — Progress Notes (Signed)
Occupational Therapy Session Note  Patient Details  Name: [name] MRN: [id number] Date of Birth: 01-Sep-1955  Today's Date: 10/01/2014 OT Individual Time: 1130-1200 OT Individual Time Calculation (min): 30 min     Skilled Therapeutic Interventions/Progress Updates:    1:1 Focus on dynamic standing balance, RW safety with functional mobility and activity tolerance in the kitchen. Pt participated in making instant oatmeal including obtaining food, utensils and bowel, transporting items safely in kitchen using compensatory strategies with instructional cuing and using microwave. Daughter present during session. Discussed community reintegration and participating simple therapeutic activities in the home for further improvement in her independence. Pt currently requires close supervision to steady A with RW  Therapy Documentation Precautions:  Precautions Precautions: Fall Precaution Comments:   Required Braces or Orthoses: Other Brace/Splint (abdominal binder) Other Brace/Splint: abdominal binder Restrictions Weight Bearing Restrictions: No Pain: No c/o pain See FIM for current functional status  Therapy/Group: Individual Therapy  Roney MansSmith, Raylen Ken [facility] Center[facility] 10/01/2014, 3:31 PM
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
## 2014-10-01 NOTE — Progress Notes (Signed)
Social Work [name], female   DOB: 03-20-1956, 59 y.o.   MRN: [id number] Met with pt to discuss discharge tomorrow, she feels ready.  Still doesn't want the tube feedings and has no plans to use them.  She feels she will eat better at home and will focus on this.  Daughter is aware Of this and is in agreement with pt also. Equipment to be delivered to pt's room today and follow up arranged via [facility]. Have had difficulty contacting Dr Randel Pigg office, voice mail box is full for follow up Appointment. Ready for discharge tomorrow.

## 2014-10-01 NOTE — Discharge Summary (Signed)
Discharge summary job [id number]

## 2014-10-01 NOTE — Progress Notes (Signed)
Day 2 of 2-Calorie Count Note  48 hour calorie count ordered.  Diet: Soft diet Supplements: Resource Breeze po TID, each supplement provides 250 kcal and 9 grams of protein.  Breakfast: 400 kcal, 13 grams of protein Lunch: none recorded Dinner: none recorded Supplements: 500 kcal, 18 grams of protein  Total intake: 900 kcal (45% of minimum estimated needs)  31 grams of protein (28% of minimum estimated needs)  Of note, lunch and dinner not recorded. RD unable to assess caloric and protein value consumed at those meals, thus unable to evaluate true caloric intake. Pt reports she does not like the hospital food, thus tells her family to bring in food for her in between meals. Pt reports she does eat well with no other difficulties. Per MD note, pt reports she will refuse to continue tube feeds once discharged. Daughter at bedside during time of visit, she reports she will encourage adequate PO intake for the patient once discharged. Pt educated to continue nutritional supplementation at home to aid in caloric and protein needs. Daughter requested information on a renal diet. "Eating Healthy with Kidney Disease" handout was left in room for pt and daughter. RD discussed food that are high in phosphorous, however pt reports she rarely consumes bread and dairy products. Additionally emphasized adequate protein intake in association with dialysis. Daughter and pt expressed understanding. Plans for pt discharge tomorrow.   Estimated Nutritional Needs: Kcal: 2000-2200 Protein: 110-125 grams Fluid: Per MD   Nutrition Dx: Increased nutrient needs related to chronic illness as evidenced by estimated needs; ongoing  Goal:  Pt to meet >/= 90% of their estimated nutrition needs; progressing  Intervention:   Discontinue Calorie count  Continue Resource Breeze po TID, each supplement provides 250 kcal and 9 grams of protein  Continue nocturnal tube feeds from 8pm-8am (12 hours) of Jevity 1.2  formula via J-tube at goal rate of 70 ml/hr with 30 ml Prostat BID per tube to provide 1208 kcal (60% of kcal needs), 77 grams of protein (70% of protein needs), and 680 ml of free water.   Renal diet handout given.  Roslyn SmilingStephanie Nazareth Kirk, MS, RD, LDN Pager # [phone] After hours/ weekend pager # [phone]

## 2014-10-01 NOTE — Progress Notes (Signed)
Physical Therapy Discharge Summary  Patient Details  Name: [name] MRN: [id number] Date of Birth: October 17, 1955  Today's Date: 10/01/2014 PT Individual Time: 1000-1100 and 1345-1445 PT Individual Time Calculation (min): 60 min and 60 min (total 120 min)   Patient has met 7 of 9 long term goals due to improved activity tolerance, improved balance, improved postural control, increased strength, decreased pain, functional use of  left upper extremity and left lower extremity, improved attention, improved awareness and improved coordination.  Patient to discharge at an ambulatory level Supervision.   Patient's care partner is independent to provide the necessary physical assistance at discharge.  Reasons goals not met: Gait goal of 100' with LRAD unmet due to limited activity tolerance, limiting pt to 31' with RW and supervision. Car transfer goal unmet due to pt requiring assistance to manage LLE into/out of car.  Recommendation:  Patient will benefit from ongoing skilled PT services in home health setting to continue to advance safe functional mobility, address ongoing impairments in activity tolerance, strength, balance, postural control, and minimize fall risk.  Equipment: Rollator  Reasons for discharge: discharge from hospital  Patient/family agrees with progress made and goals achieved: Yes  PT Discharge Precautions/Restrictions Precautions Precautions: Fall Precaution Comments:   Required Braces or Orthoses: Other Brace/Splint (abdominal binder) Other Brace/Splint: abdominal binder Restrictions Weight Bearing Restrictions: No Vital Signs   Pain Pain Assessment Pain Assessment: 0-10 Pain Score: 4  Pain Type: Chronic pain Pain Location: Abdomen Pain Orientation: Left Pain Descriptors / Indicators: Aching;Sore Pain Onset: On-going Patients Stated Pain Goal: 4 Pain Intervention(s): Ambulation/increased activity;Repositioned Multiple Pain Sites: No Vision/Perception      Cognition Overall Cognitive Status: Within Functional Limits for tasks assessed Arousal/Alertness: Awake/alert Orientation Level: Oriented X4 Attention: Selective Selective Attention: Appears intact Memory: Appears intact Awareness: Appears intact Problem Solving: Appears intact Safety/Judgment: Appears intact Sensation Sensation Light Touch: Appears Intact Stereognosis: Not tested Hot/Cold: Not tested Proprioception: Appears Intact Coordination Gross Motor Movements are Fluid and Coordinated: Yes Fine Motor Movements are Fluid and Coordinated: Yes Coordination and Movement Description: Pt with decreased strength and mobility in LLE/LUE Motor  Motor Motor: Hemiplegia;Other (comment);Abnormal postural alignment and control Motor - Skilled Clinical Observations: LUE/LE weakness, decreased balance, decreased endurance Motor - Discharge Observations: mild left hemiparesis in the LLE at this time.  Mobility Bed Mobility Bed Mobility: Supine to Sit;Sit to Supine Supine to Sit: 5: Supervision;HOB flat Supine to Sit Details: Verbal cues for precautions/safety Sit to Supine: 5: Supervision Sit to Supine - Details: Verbal cues for sequencing;Verbal cues for technique Transfers Transfers: Yes Sit to Stand: 5: Supervision;With armrests Sit to Stand Details: Verbal cues for technique Sit to Stand Details (indicate cue type and reason): verbal cues for forward weight shifting over feet Stand to Sit: With armrests;5: Supervision Stand to Sit Details (indicate cue type and reason): Verbal cues for technique;Verbal cues for precautions/safety Stand Pivot Transfers: 5: Supervision Stand Pivot Transfer Details: Verbal cues for safe use of DME/AE;Verbal cues for technique Locomotion  Ambulation Ambulation: Yes Ambulation/Gait Assistance: 5: Supervision Assistive device: Rolling walker Ambulation/Gait Assistance Details: Verbal cues for safe use of DME/AE Gait Gait: Yes Gait  Pattern: Poor foot clearance - left;Poor foot clearance - right;Shuffle;Wide base of support;Decreased step length - right;Decreased step length - left;Decreased dorsiflexion - left Stairs / Additional Locomotion Stairs: Yes Stairs Assistance: 4: Min assist Stairs Assistance Details: Verbal cues for sequencing;Verbal cues for gait pattern Stair Management Technique: Two rails;Step to pattern Number of Stairs: 12 Height of  Stairs: 6 Architect: Yes Wheelchair Assistance: 5: Investment banker, operational Details: Verbal cues for safe use of DME/AE;Verbal cues for Marketing executive: Both upper extremities Wheelchair Parts Management: Needs assistance Distance: 150  Trunk/Postural Assessment  Cervical Assessment Cervical Assessment: Within Functional Limits Thoracic Assessment Thoracic Assessment: Within Functional Limits Lumbar Assessment Lumbar Assessment: Within Functional Limits Lumbar Strength Overall Lumbar Strength Comments: lumbar flexion with posterior pelvic tilt at rest Postural Control Protective Responses: Pt with very delayed to no protective responses and relies on UEs for balance Postural Limitations: Pt with severe posterior lean with transitioning from sit<>stand.   Balance Balance Balance Assessed: Yes Static Sitting Balance Static Sitting - Balance Support: No upper extremity supported;Feet supported Static Sitting - Level of Assistance: 6: Modified independent (Device/Increase time) Dynamic Sitting Balance Dynamic Sitting - Balance Support: No upper extremity supported;Feet supported Dynamic Sitting - Level of Assistance: 6: Modified independent (Device/Increase time) Static Standing Balance Static Standing - Balance Support: Bilateral upper extremity supported Static Standing - Level of Assistance: 5: Stand by assistance Dynamic Standing Balance Dynamic Standing - Balance Support: Left upper extremity  supported Dynamic Standing - Level of Assistance: 4: Min assist Extremity Assessment  RUE Assessment RUE Assessment: Within Functional Limits (strength grossly 3+/5 throughout)   RLE Assessment RLE Assessment: Within Functional Limits LLE Assessment LLE Assessment: Exceptions to [facility] LLE Strength LLE Overall Strength: Deficits LLE Overall Strength Comments: Hip flex 3-/5, knee ext 4/5, knee flx 4-/5, ankle df 4-/5, ankle pf 4-/5  Skilled therapeutic intervention: AM session: Pt received seated in w/c with OT present following previous session and daughter present for family education. C/o pain in abdomen as described above; agreeable to treatment. Pt instructed in ascent/descent of 12 stairs with B handrails and minA. Required seated rest break at top of stairs before descent. Requires inc time to perform due to strength deficits and several standing rest breaks required. Pt and daughter instructed in bed <> w/c transfer including bed mobility, all performed with supervision. Pt and daughter instructed in car transfer with stand step transfer with RW, requires minA due to difficulty with LLE management into/out of car. Otherwise performed with supervision. Pt returned to room and remained seated in w/c with all needs within reach and daughter present at completion of session.  PM session: Pt received in w/c with no c/o pain and agreeable to treatment. Strength and sensation assessed as described in detail above. Pt instructed in gait training x65' with RW and supervision. Instructed pt in gait with rollator per pt/family request for use of rollator upon d/c. Explained importance of using brakes as well as another person to stability rollator prior to sitting. Pt able to demonstrate safe ambulation and sitting/standing from rollator with supervision. Performed Nustep on level 2 with BUEs and BLEs x15 min; activity performed to improve aerobic endurance for carryover into functional gait and adls. Pt  returned to room and remained seated in w/c with all needs within reach at completion of session.   See FIM for current functional status   Luberta Mutter 10/01/2014, 12:24 PM

## 2014-10-02 MED ORDER — AMIODARONE HCL 200 MG PO TABS: ORAL_TABLET | ORAL | Status: DC

## 2014-10-02 MED ORDER — COLLAGENASE 250 UNIT/GM EX OINT: TOPICAL_OINTMENT | Freq: Every day | CUTANEOUS | Status: DC

## 2014-10-02 MED ORDER — ALPRAZOLAM 0.5 MG PO TABS: 0.5000 mg | ORAL_TABLET | ORAL | Status: DC

## 2014-10-02 MED ORDER — CITALOPRAM HYDROBROMIDE 20 MG PO TABS: 20.0000 mg | ORAL_TABLET | Freq: Every day | ORAL | Status: DC

## 2014-10-02 MED ORDER — OXYCODONE HCL 5 MG PO TABS: 5.0000 mg | ORAL_TABLET | Freq: Four times a day (QID) | ORAL | Status: DC | PRN

## 2014-10-02 MED ORDER — MIDODRINE HCL 10 MG PO TABS: 10.0000 mg | ORAL_TABLET | Freq: Two times a day (BID) | ORAL | Status: DC

## 2014-10-02 MED ORDER — JEVITY 1.2 CAL PO LIQD: 1000.0000 mL | ORAL | Status: DC

## 2014-10-02 MED ORDER — RENA-VITE PO TABS: 1.0000 | ORAL_TABLET | Freq: Every day | ORAL | Status: DC

## 2014-10-02 MED ORDER — LEVOTHYROXINE SODIUM 25 MCG PO TABS: 25.0000 ug | ORAL_TABLET | Freq: Every day | ORAL | Status: DC

## 2014-10-02 MED ORDER — ASCORBIC ACID 250 MG PO TABS: 250.0000 mg | ORAL_TABLET | Freq: Every day | ORAL | Status: DC

## 2014-10-02 MED ORDER — SEVELAMER CARBONATE 2.4 G PO PACK: 2.4000 g | PACK | Freq: Three times a day (TID) | ORAL | Status: DC

## 2014-10-02 NOTE — Progress Notes (Signed)
Social Work Discharge Note Discharge Note  The overall goal for the admission was met for:   Discharge location: [place] [facility]  Length of Stay: Yes-21 DAYS  Discharge activity level: Yes-SUPERVISION WITH CUEING  Home/community participation: Yes  Services provided included: MD, RD, PT, OT, SLP, RN, CM, TR, Pharmacy, Neuropsych and SW  Financial Services: Medicaid  Follow-up services arranged: Home Health: [facility]-PT,OT,RN, DME: [place], [place] and Patient/Family has no preference for HH/DME agencies  Comments (or additional information):DAUGHTER WAS IN FOR FAMILY EDUCATION AND FEELS COMFORTABLE WITH MOM'S CARE. PT CONTINUES TO REFUSE TUBE FEEDINGS AT HOME AND FEELS WILL EAT BETTER AT HOME. DAUGHTER AWARE OF THIS AND IS IN AGREEMENT WITH PT, WILL PUSH PO INTAKE. GOING BY HD TODAY TO GET PAPERWORK TO START AT [facility] TOMORROW. PT READY TO GO HOME, WILL HAVE 24 HR CARE  Patient/Family verbalized understanding of follow-up arrangements: Yes  Individual responsible for coordination of the follow-up plan: SELF & GERNEISHA-DAUGHTER  Confirmed correct DME delivered: Elease Hashimoto 10/02/2014    Elease Hashimoto

## 2014-10-02 NOTE — Progress Notes (Signed)
[facility] PHYSICAL MEDICINE & REHABILITATION     PROGRESS NOTE                                          Subjective/Complaints:  Going Home!  ROS: Pt denies , + mild abdominal pain,SOB, chest pain,, anxiety, or depression, otherwise as above  Objective: Vital Signs: Blood pressure 91/64, pulse 79, temperature 97.8 F (36.6 C), temperature source Oral, resp. rate 16, height  (1.702 m), weight 91.2 kg (201 lb 1 oz), SpO2 100 %. No results found.  Recent Labs  09/29/14 1520 10/01/14 1629  WBC 8.4 8.3  HGB 9.9* 9.7*  HCT 31.5* 30.9*  PLT 259 276    Recent Labs  09/29/14 1521 10/01/14 1629  NA 130* 134*  K 4.0 4.1  CL 96* 96*  GLUCOSE 104* 94  BUN 40* 38*  CREATININE 5.91* 5.41*  CALCIUM 8.0* 8.4*   CBG (last 3)  No results for input(s): GLUCAP in the last 72 hours.  Wt Readings from Last 3 Encounters:  10/02/14 91.2 kg (201 lb 1 oz)  09/10/14 98.975 kg (218 lb 3.2 oz)  06/01/14 102.059 kg (225 lb)    Physical Exam:  Gen: alert, no distress, obese HENT:  Poor dentition,  Eyes: EOM are normal.  Neck: Normal range of motion. Neck supple. No thyromegaly present.  Cardiovascular: Normal rate and regular rhythm. no murmurs Respiratory: Effort normal and breath sounds normal. No respiratory distress. occ rhonchi and upper airway sounds. No cough today GI: Bowel sounds are normal. Mild diffuse tenderness Abdominal wound granulation tissue pink, granulation at inferior margin where it tunnels inferiorly. G-tube site clean and dry , no drainage Skin: l, 2+ edema RLE, trace edema RLE Neurological: She is alert.  She is oriented to person place date of birth.  LUE 4/5 deltoid, bicep, tricep. 4/5 wrist/hand. RUE grossly 4+/5. LE: 3+ RHF, 3+ LHF, 3+ RKE, 3 LKE, ankles grossly 3+ to 4-/5. Marland Kitchen Intact LT BLE.   cooperative.  Psych: pleasant  , mood affect depressed  Assessment/Plan: 1. 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  In the setting of severe  deconditioning Will need f/u with PCP Gen surgery- who will determine when Tube is removed Nephrology Neurology  Cardiology Stable for D/C today  No F/u PM&R given multiple MDs following her and the fact that CVA deficits are relatively minor compared to other issues See D/C summary See D/C instructions  FIM - Bathing Bathing Steps Patient Completed: Chest, Right Arm, Left Arm, Abdomen, Front perineal area, Buttocks, Right upper leg, Left upper leg, Right lower leg (including foot), Left lower leg (including foot) Bathing: 4: Steadying assist  FIM - Upper Body Dressing/Undressing Upper body dressing/undressing steps patient completed: Thread/unthread right sleeve of pullover shirt/dresss, Thread/unthread left sleeve of pullover shirt/dress, Put head through opening of pull over shirt/dress, Pull shirt over trunk Upper body dressing/undressing: 5: Supervision: Safety issues/verbal cues FIM - Lower Body Dressing/Undressing Lower body dressing/undressing steps patient completed: Thread/unthread right pants leg, Pull pants up/down, Don/Doff right shoe, Thread/unthread left pants leg, Don/Doff left sock, Don/Doff right sock Lower body dressing/undressing: 4: Min-Patient completed 75 plus % of tasks  FIM - Toileting Toileting steps completed by patient: Adjust clothing prior to toileting, Performs perineal hygiene, Adjust clothing after toileting Toileting Assistive Devices: Grab bar or rail for support Toileting: 4: Steadying assist  FIM - Toilet Transfers  Museum/gallery curatorToilet Transfers Assistive Devices: Arts development officerBedside commode, Art gallery managerWalker Toilet Transfers: 4-To toilet/BSC: Min A (steadying Pt. > 75%), 4-From toilet/BSC: Min A (steadying Pt. > 75%)  FIM - Bed/Chair Transfer Bed/Chair Transfer Assistive Devices: Therapist, occupationalWalker Bed/Chair Transfer: 5: Supine > Sit: Supervision (verbal cues/safety issues), 5: Sit > Supine: Supervision (verbal cues/safety issues), 5: Bed > Chair or W/C: Supervision (verbal cues/safety  issues), 5: Chair or W/C > Bed: Supervision (verbal cues/safety issues)  FIM - Locomotion: Wheelchair Distance: 150 Locomotion: Wheelchair: 5: Travels 150 ft or more: maneuvers on rugs and over door sills with supervision, cueing or coaxing FIM - Locomotion: Ambulation Locomotion: Ambulation Assistive Devices: Designer, industrial/productWalker - Rolling Ambulation/Gait Assistance: 5: Supervision Locomotion: Ambulation: 2: Travels 50 - 149 ft with supervision/safety issues  Comprehension Comprehension Mode: Auditory Comprehension: 5-Follows basic conversation/direction: With extra time/assistive device  Expression Expression Mode: Verbal Expression: 5-Expresses basic needs/ideas: With extra time/assistive device  Social Interaction Social Interaction: 4-Interacts appropriately 75 - 89% of the time - Needs redirection for appropriate language or to initiate interaction.  Problem Solving Problem Solving: 5-Solves basic 90% of the time/requires cueing < 10% of the time  Memory Memory: 5-Recognizes or recalls 90% of the time/requires cueing < 10% of the time  Medical Problem List and Plan: 1. 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 status post loop recorder/respiratory failure/multi-medical  2. DVT Prophylaxis/Anticoagulation: SCD.  Venous Doppler studies negative, can use TEDs, no need for repeat Doppler 3. Pain Management: Oxycodone as needed. Fair control at present 4. End-stage renal disease. Left arm AV fistula 09/09/2014. Hemodialysis as per renal services, now using R IJ cath, left central line removed on 6/28 5. Neuropsych: This patient is capable of making decisions on her own behalf. 6. Skin/Wound Care: Routine skin checks/dressing abdominal wound with wet-to-dry, pitting edema worse on hemiparetic side, add santyl collagenase to inferior aspect    -continuous TF on board overnight- 8. Hx of C diff: no recurrence 9. Hypertension/intermittent bouts of PSVT. Lopressor 12.5 mg twice a  day--rate controlled. 10. Chronic Ischemic colitis, thper Gen surg this is most likely etiology of diarrhea, no abd pain at present but it is intermittent                       11. Decreased nutritional storage.Status post GJ tube 08/12/2014 per interventional radiology, still needs TF to supplement po but pt refused  LOS (Days) 21 A FACE TO FACE EVALUATION WAS PERFORMED  [name],[name] 10/02/2014 7:36 AM

## 2014-10-06 ENCOUNTER — Telehealth: Payer: Self-pay | Admitting: *Deleted

## 2014-10-06 ENCOUNTER — Other Ambulatory Visit: Payer: Self-pay | Admitting: *Deleted

## 2014-10-06 MED ORDER — COLLAGENASE 250 UNIT/GM EX OINT: TOPICAL_OINTMENT | Freq: Every day | CUTANEOUS | Status: DC

## 2014-10-06 NOTE — Telephone Encounter (Signed)
error 

## 2014-10-06 NOTE — Telephone Encounter (Signed)
Diane notified.

## 2014-10-06 NOTE — Telephone Encounter (Signed)
Thanks for sending that in.  Would recommend flushing the PEG with 100cc of H2O daily until it comes out

## 2014-10-06 NOTE — Telephone Encounter (Signed)
Diane RN called and said that they needed the santyl ointment for wound care.  It looks like the order from Jesusita OkaDan was on print so [facility]  She is also asking if there should be any orders for flushing the peg tube even though the patient is eating.  Please advise.

## 2014-10-13 ENCOUNTER — Other Ambulatory Visit: Payer: Self-pay | Admitting: General Surgery

## 2014-10-13 DIAGNOSIS — K259 Gastric ulcer, unspecified as acute or chronic, without hemorrhage or perforation: Secondary | ICD-10-CM

## 2014-10-14 ENCOUNTER — Telehealth: Payer: Self-pay | Admitting: *Deleted

## 2014-10-14 NOTE — Telephone Encounter (Signed)
FYI - [facility] called to let us know that because of scheduling conflicts patient will only have one visit this week.

## 2014-10-16 ENCOUNTER — Telehealth: Payer: Self-pay | Admitting: *Deleted

## 2014-10-16 ENCOUNTER — Ambulatory Visit
Admission: RE | Admit: 2014-10-16 | Discharge: 2014-10-16 | Disposition: A | Discharge status: Home / Self Care | Payer: Medicaid Other | Source: Ambulatory Visit | Attending: General Surgery | Admitting: General Surgery

## 2014-10-16 NOTE — Telephone Encounter (Signed)
[name] - level 1 contra  Indication between Pacerone and Celexa - prolonged QT wave... They are just letting us know

## 2014-10-22 ENCOUNTER — Other Ambulatory Visit: Payer: Self-pay | Admitting: General Surgery

## 2014-10-23 ENCOUNTER — Other Ambulatory Visit: Payer: Self-pay | Admitting: General Surgery

## 2014-10-23 DIAGNOSIS — K259 Gastric ulcer, unspecified as acute or chronic, without hemorrhage or perforation: Secondary | ICD-10-CM

## 2014-10-27 ENCOUNTER — Other Ambulatory Visit: Payer: Self-pay | Admitting: General Surgery

## 2014-10-27 DIAGNOSIS — K259 Gastric ulcer, unspecified as acute or chronic, without hemorrhage or perforation: Secondary | ICD-10-CM

## 2014-10-29 ENCOUNTER — Other Ambulatory Visit: Payer: Self-pay | Admitting: General Surgery

## 2014-10-29 DIAGNOSIS — I69351 Hemiplegia and hemiparesis following cerebral infarction affecting right dominant side: Secondary | ICD-10-CM | POA: Diagnosis not present

## 2014-10-29 DIAGNOSIS — N186 End stage renal disease: Secondary | ICD-10-CM | POA: Diagnosis not present

## 2014-10-29 DIAGNOSIS — I720 Aneurysm of carotid artery: Secondary | ICD-10-CM | POA: Diagnosis not present

## 2014-10-29 DIAGNOSIS — J449 Chronic obstructive pulmonary disease, unspecified: Secondary | ICD-10-CM | POA: Diagnosis not present

## 2014-11-05 ENCOUNTER — Telehealth (HOSPITAL_COMMUNITY): Payer: Self-pay | Admitting: General Surgery

## 2014-11-05 ENCOUNTER — Telehealth: Payer: Self-pay | Admitting: *Deleted

## 2014-11-05 NOTE — Telephone Encounter (Signed)
[facility] OT calle to report a missed OT visit with [place]. Patient could not be reached. (their protocol is to inform MD). Noted.

## 2014-11-05 NOTE — Telephone Encounter (Signed)
Called pt, left VM for her to call to schedule her g-tube removal. JM

## 2014-11-09 ENCOUNTER — Telehealth: Payer: Self-pay | Admitting: *Deleted

## 2014-11-09 ENCOUNTER — Inpatient Hospital Stay: Payer: Self-pay | Admitting: Physical Medicine & Rehabilitation

## 2014-11-09 NOTE — Telephone Encounter (Signed)
Requesting an order to continue HHPT 1 wk1,2wk1,1wk1 .  Approval given.

## 2014-11-10 ENCOUNTER — Ambulatory Visit (HOSPITAL_COMMUNITY): Admission: RE | Admit: 2014-11-10 | Payer: Medicaid Other | Source: Ambulatory Visit

## 2014-11-10 ENCOUNTER — Emergency Department (HOSPITAL_COMMUNITY): Payer: Medicaid Other

## 2014-11-10 ENCOUNTER — Telehealth: Payer: Self-pay | Admitting: *Deleted

## 2014-11-10 ENCOUNTER — Encounter (HOSPITAL_COMMUNITY): Payer: Self-pay | Admitting: *Deleted

## 2014-11-10 ENCOUNTER — Inpatient Hospital Stay (HOSPITAL_COMMUNITY): Payer: Medicaid Other

## 2014-11-10 ENCOUNTER — Inpatient Hospital Stay (HOSPITAL_COMMUNITY)
Admission: EM | Admit: 2014-11-10 | Discharge: 2014-11-25 | DRG: 061 | Disposition: A | Discharge status: Rehabilitation Facility | Payer: Medicaid Other | Attending: Neurology | Admitting: Neurology

## 2014-11-10 DIAGNOSIS — Z66 Do not resuscitate: Secondary | ICD-10-CM | POA: Diagnosis not present

## 2014-11-10 DIAGNOSIS — I480 Paroxysmal atrial fibrillation: Secondary | ICD-10-CM | POA: Diagnosis present

## 2014-11-10 DIAGNOSIS — I9589 Other hypotension: Secondary | ICD-10-CM | POA: Diagnosis present

## 2014-11-10 DIAGNOSIS — Y832 Surgical operation with anastomosis, bypass or graft as the cause of abnormal reaction of the patient, or of later complication, without mention of misadventure at the time of the procedure: Secondary | ICD-10-CM | POA: Diagnosis not present

## 2014-11-10 DIAGNOSIS — E785 Hyperlipidemia, unspecified: Secondary | ICD-10-CM | POA: Diagnosis present

## 2014-11-10 DIAGNOSIS — I6789 Other cerebrovascular disease: Secondary | ICD-10-CM | POA: Diagnosis not present

## 2014-11-10 DIAGNOSIS — Z931 Gastrostomy status: Secondary | ICD-10-CM | POA: Diagnosis not present

## 2014-11-10 DIAGNOSIS — I69320 Aphasia following cerebral infarction: Secondary | ICD-10-CM | POA: Diagnosis not present

## 2014-11-10 DIAGNOSIS — Z7982 Long term (current) use of aspirin: Secondary | ICD-10-CM | POA: Diagnosis not present

## 2014-11-10 DIAGNOSIS — E669 Obesity, unspecified: Secondary | ICD-10-CM | POA: Diagnosis present

## 2014-11-10 DIAGNOSIS — E162 Hypoglycemia, unspecified: Secondary | ICD-10-CM | POA: Diagnosis not present

## 2014-11-10 DIAGNOSIS — Z8249 Family history of ischemic heart disease and other diseases of the circulatory system: Secondary | ICD-10-CM

## 2014-11-10 DIAGNOSIS — Q2112 Patent foramen ovale: Secondary | ICD-10-CM | POA: Diagnosis present

## 2014-11-10 DIAGNOSIS — I63412 Cerebral infarction due to embolism of left middle cerebral artery: Secondary | ICD-10-CM | POA: Diagnosis present

## 2014-11-10 DIAGNOSIS — Z8673 Personal history of transient ischemic attack (TIA), and cerebral infarction without residual deficits: Secondary | ICD-10-CM

## 2014-11-10 DIAGNOSIS — R4701 Aphasia: Secondary | ICD-10-CM | POA: Diagnosis present

## 2014-11-10 DIAGNOSIS — Z9911 Dependence on respirator [ventilator] status: Secondary | ICD-10-CM

## 2014-11-10 DIAGNOSIS — Z833 Family history of diabetes mellitus: Secondary | ICD-10-CM | POA: Diagnosis not present

## 2014-11-10 DIAGNOSIS — Z8679 Personal history of other diseases of the circulatory system: Secondary | ICD-10-CM

## 2014-11-10 DIAGNOSIS — I639 Cerebral infarction, unspecified: Secondary | ICD-10-CM | POA: Diagnosis present

## 2014-11-10 DIAGNOSIS — E876 Hypokalemia: Secondary | ICD-10-CM | POA: Diagnosis present

## 2014-11-10 DIAGNOSIS — J9620 Acute and chronic respiratory failure, unspecified whether with hypoxia or hypercapnia: Secondary | ICD-10-CM

## 2014-11-10 DIAGNOSIS — I63312 Cerebral infarction due to thrombosis of left middle cerebral artery: Secondary | ICD-10-CM | POA: Diagnosis not present

## 2014-11-10 DIAGNOSIS — I714 Abdominal aortic aneurysm, without rupture: Secondary | ICD-10-CM | POA: Diagnosis present

## 2014-11-10 DIAGNOSIS — J9811 Atelectasis: Secondary | ICD-10-CM | POA: Diagnosis not present

## 2014-11-10 DIAGNOSIS — I120 Hypertensive chronic kidney disease with stage 5 chronic kidney disease or end stage renal disease: Secondary | ICD-10-CM | POA: Diagnosis present

## 2014-11-10 DIAGNOSIS — R1310 Dysphagia, unspecified: Secondary | ICD-10-CM | POA: Diagnosis present

## 2014-11-10 DIAGNOSIS — R5381 Other malaise: Secondary | ICD-10-CM | POA: Diagnosis not present

## 2014-11-10 DIAGNOSIS — Z992 Dependence on renal dialysis: Secondary | ICD-10-CM | POA: Diagnosis not present

## 2014-11-10 DIAGNOSIS — N186 End stage renal disease: Secondary | ICD-10-CM

## 2014-11-10 DIAGNOSIS — I69351 Hemiplegia and hemiparesis following cerebral infarction affecting right dominant side: Secondary | ICD-10-CM | POA: Diagnosis not present

## 2014-11-10 DIAGNOSIS — Q211 Atrial septal defect: Secondary | ICD-10-CM | POA: Diagnosis not present

## 2014-11-10 DIAGNOSIS — F17210 Nicotine dependence, cigarettes, uncomplicated: Secondary | ICD-10-CM | POA: Diagnosis present

## 2014-11-10 DIAGNOSIS — J9600 Acute respiratory failure, unspecified whether with hypoxia or hypercapnia: Secondary | ICD-10-CM | POA: Insufficient documentation

## 2014-11-10 DIAGNOSIS — Z978 Presence of other specified devices: Secondary | ICD-10-CM

## 2014-11-10 DIAGNOSIS — G8191 Hemiplegia, unspecified affecting right dominant side: Secondary | ICD-10-CM | POA: Diagnosis present

## 2014-11-10 DIAGNOSIS — I63032 Cerebral infarction due to thrombosis of left carotid artery: Secondary | ICD-10-CM | POA: Diagnosis not present

## 2014-11-10 DIAGNOSIS — Z6831 Body mass index (BMI) 31.0-31.9, adult: Secondary | ICD-10-CM | POA: Diagnosis not present

## 2014-11-10 DIAGNOSIS — F419 Anxiety disorder, unspecified: Secondary | ICD-10-CM | POA: Diagnosis present

## 2014-11-10 DIAGNOSIS — G8190 Hemiplegia, unspecified affecting unspecified side: Secondary | ICD-10-CM | POA: Diagnosis not present

## 2014-11-10 DIAGNOSIS — I10 Essential (primary) hypertension: Secondary | ICD-10-CM | POA: Diagnosis not present

## 2014-11-10 DIAGNOSIS — Z7951 Long term (current) use of inhaled steroids: Secondary | ICD-10-CM | POA: Diagnosis not present

## 2014-11-10 DIAGNOSIS — T82838A Hemorrhage of vascular prosthetic devices, implants and grafts, initial encounter: Secondary | ICD-10-CM | POA: Diagnosis not present

## 2014-11-10 DIAGNOSIS — R531 Weakness: Secondary | ICD-10-CM | POA: Diagnosis present

## 2014-11-10 DIAGNOSIS — Z9289 Personal history of other medical treatment: Secondary | ICD-10-CM

## 2014-11-10 DIAGNOSIS — I63512 Cerebral infarction due to unspecified occlusion or stenosis of left middle cerebral artery: Secondary | ICD-10-CM | POA: Diagnosis not present

## 2014-11-10 DIAGNOSIS — R0603 Acute respiratory distress: Secondary | ICD-10-CM | POA: Diagnosis present

## 2014-11-10 DIAGNOSIS — R9401 Abnormal electroencephalogram [EEG]: Secondary | ICD-10-CM | POA: Diagnosis present

## 2014-11-10 DIAGNOSIS — G9340 Encephalopathy, unspecified: Secondary | ICD-10-CM | POA: Diagnosis present

## 2014-11-10 DIAGNOSIS — N2581 Secondary hyperparathyroidism of renal origin: Secondary | ICD-10-CM | POA: Diagnosis present

## 2014-11-10 DIAGNOSIS — Z79899 Other long term (current) drug therapy: Secondary | ICD-10-CM | POA: Diagnosis not present

## 2014-11-10 DIAGNOSIS — J449 Chronic obstructive pulmonary disease, unspecified: Secondary | ICD-10-CM | POA: Diagnosis present

## 2014-11-10 DIAGNOSIS — R0600 Dyspnea, unspecified: Secondary | ICD-10-CM | POA: Diagnosis not present

## 2014-11-10 DIAGNOSIS — F329 Major depressive disorder, single episode, unspecified: Secondary | ICD-10-CM | POA: Diagnosis present

## 2014-11-10 DIAGNOSIS — J9601 Acute respiratory failure with hypoxia: Secondary | ICD-10-CM | POA: Diagnosis present

## 2014-11-10 DIAGNOSIS — R569 Unspecified convulsions: Secondary | ICD-10-CM | POA: Diagnosis not present

## 2014-11-10 DIAGNOSIS — I272 Other secondary pulmonary hypertension: Secondary | ICD-10-CM | POA: Diagnosis present

## 2014-11-10 LAB — COMPREHENSIVE METABOLIC PANEL
ALBUMIN: 3 g/dL — AB (ref 3.5–5.0)
ALK PHOS: 74 U/L (ref 38–126)
ALT: 16 U/L (ref 14–54)
ANION GAP: 15 (ref 5–15)
AST: 23 U/L (ref 15–41)
BUN: 25 mg/dL — ABNORMAL HIGH (ref 6–20)
CALCIUM: 8 mg/dL — AB (ref 8.9–10.3)
CO2: 23 mmol/L (ref 22–32)
CREATININE: 5.66 mg/dL — AB (ref 0.44–1.00)
Chloride: 97 mmol/L — ABNORMAL LOW (ref 101–111)
GFR, EST AFRICAN AMERICAN: 9 mL/min — AB (ref >60)
GFR, EST NON AFRICAN AMERICAN: 7 mL/min — AB (ref >60)
Glucose, Bld: 84 mg/dL (ref 65–99)
POTASSIUM: 3.4 mmol/L — AB (ref 3.5–5.1)
Sodium: 135 mmol/L (ref 135–145)
Total Bilirubin: 1.3 mg/dL — ABNORMAL HIGH (ref 0.3–1.2)
Total Protein: 7.5 g/dL (ref 6.5–8.1)

## 2014-11-10 LAB — I-STAT ARTERIAL BLOOD GAS, ED
Acid-Base Excess: 1 mmol/L (ref 0.0–2.0)
Bicarbonate: 27.6 mEq/L — ABNORMAL HIGH (ref 20.0–24.0)
O2 SAT: 100 %
Patient temperature: 98.6
TCO2: 29 mmol/L (ref 0–100)
pCO2 arterial: 52.3 mmHg — ABNORMAL HIGH (ref 35.0–45.0)
pH, Arterial: 7.331 — ABNORMAL LOW (ref 7.350–7.450)
pO2, Arterial: 409 mmHg — ABNORMAL HIGH (ref 80.0–100.0)

## 2014-11-10 LAB — DIFFERENTIAL
BASOS PCT: 0 % (ref 0–1)
Basophils Absolute: 0 10*3/uL (ref 0.0–0.1)
Eosinophils Absolute: 0.2 10*3/uL (ref 0.0–0.7)
Eosinophils Relative: 3 % (ref 0–5)
LYMPHS PCT: 9 % — AB (ref 12–46)
Lymphs Abs: 0.6 10*3/uL — ABNORMAL LOW (ref 0.7–4.0)
MONOS PCT: 21 % — AB (ref 3–12)
Monocytes Absolute: 1.4 10*3/uL — ABNORMAL HIGH (ref 0.1–1.0)
NEUTROS PCT: 67 % (ref 43–77)
Neutro Abs: 4.6 10*3/uL (ref 1.7–7.7)

## 2014-11-10 LAB — CBC
HCT: 36.9 % (ref 36.0–46.0)
HEMOGLOBIN: 12 g/dL (ref 12.0–15.0)
MCH: 29.3 pg (ref 26.0–34.0)
MCHC: 32.5 g/dL (ref 30.0–36.0)
MCV: 90.2 fL (ref 78.0–100.0)
Platelets: 202 10*3/uL (ref 150–400)
RBC: 4.09 MIL/uL (ref 3.87–5.11)
RDW: 19.8 % — AB (ref 11.5–15.5)
WBC: 6.8 10*3/uL (ref 4.0–10.5)

## 2014-11-10 LAB — GLUCOSE, CAPILLARY
GLUCOSE-CAPILLARY: 63 mg/dL — AB (ref 65–99)
GLUCOSE-CAPILLARY: 85 mg/dL (ref 65–99)
Glucose-Capillary: 67 mg/dL (ref 65–99)
Glucose-Capillary: 74 mg/dL (ref 65–99)
Glucose-Capillary: 78 mg/dL (ref 65–99)

## 2014-11-10 LAB — URINALYSIS, ROUTINE W REFLEX MICROSCOPIC
Glucose, UA: NEGATIVE mg/dL
Hgb urine dipstick: NEGATIVE
Ketones, ur: 15 mg/dL — AB
Leukocytes, UA: NEGATIVE
NITRITE: NEGATIVE
Protein, ur: 100 mg/dL — AB
Specific Gravity, Urine: 1.013 (ref 1.005–1.030)
Urobilinogen, UA: 1 mg/dL (ref 0.0–1.0)
pH: 5 (ref 5.0–8.0)

## 2014-11-10 LAB — URINE MICROSCOPIC-ADD ON

## 2014-11-10 LAB — ETHANOL

## 2014-11-10 LAB — RAPID URINE DRUG SCREEN, HOSP PERFORMED
Amphetamines: NOT DETECTED
Barbiturates: NOT DETECTED
Benzodiazepines: POSITIVE — AB
COCAINE: NOT DETECTED
OPIATES: NOT DETECTED
Tetrahydrocannabinol: POSITIVE — AB

## 2014-11-10 LAB — I-STAT TROPONIN, ED: Troponin i, poc: 0.06 ng/mL (ref 0.00–0.08)

## 2014-11-10 LAB — I-STAT CHEM 8, ED
BUN: 28 mg/dL — AB (ref 6–20)
Calcium, Ion: 0.95 mmol/L — ABNORMAL LOW (ref 1.12–1.23)
Chloride: 97 mmol/L — ABNORMAL LOW (ref 101–111)
Creatinine, Ser: 5.3 mg/dL — ABNORMAL HIGH (ref 0.44–1.00)
Glucose, Bld: 81 mg/dL (ref 65–99)
HCT: 45 % (ref 36.0–46.0)
HEMOGLOBIN: 15.3 g/dL — AB (ref 12.0–15.0)
Potassium: 3.3 mmol/L — ABNORMAL LOW (ref 3.5–5.1)
SODIUM: 134 mmol/L — AB (ref 135–145)
TCO2: 23 mmol/L (ref 0–100)

## 2014-11-10 LAB — PROTIME-INR
INR: 1.29 (ref 0.00–1.49)
Prothrombin Time: 16.3 seconds — ABNORMAL HIGH (ref 11.6–15.2)

## 2014-11-10 LAB — CBG MONITORING, ED
GLUCOSE-CAPILLARY: 70 mg/dL (ref 65–99)
Glucose-Capillary: 78 mg/dL (ref 65–99)

## 2014-11-10 LAB — APTT: APTT: 35 s (ref 24–37)

## 2014-11-10 LAB — MRSA PCR SCREENING: MRSA BY PCR: NEGATIVE

## 2014-11-10 MED ORDER — LABETALOL HCL 5 MG/ML IV SOLN
INTRAVENOUS | Status: AC
  Filled 2014-11-10: qty 4

## 2014-11-10 MED ORDER — LIDOCAINE HCL (CARDIAC) 20 MG/ML IV SOLN
INTRAVENOUS | Status: AC
  Filled 2014-11-10: qty 5

## 2014-11-10 MED ORDER — VITAL HIGH PROTEIN PO LIQD
1000.0000 mL | ORAL | Status: DC
  Administered 2014-11-11 (×2): 1000 mL
  Filled 2014-11-10 (×3): qty 1000

## 2014-11-10 MED ORDER — PROPOFOL 1000 MG/100ML IV EMUL
INTRAVENOUS | Status: AC
  Filled 2014-11-10: qty 100

## 2014-11-10 MED ORDER — CHLORHEXIDINE GLUCONATE 0.12% ORAL RINSE (MEDLINE KIT): 15.0000 mL | Freq: Two times a day (BID) | OROMUCOSAL | Status: DC

## 2014-11-10 MED ORDER — PROPOFOL 1000 MG/100ML IV EMUL
5.0000 ug/kg/min | Freq: Once | INTRAVENOUS | Status: DC
  Administered 2014-11-10: 30 ug/kg/min via INTRAVENOUS

## 2014-11-10 MED ORDER — SODIUM BICARBONATE 650 MG PO TABS
650.0000 mg | ORAL_TABLET | Freq: Once | ORAL | Status: AC
  Administered 2014-11-10: 650 mg via ORAL
  Filled 2014-11-10: qty 1

## 2014-11-10 MED ORDER — IPRATROPIUM BROMIDE 0.02 % IN SOLN: 0.5000 mg | Freq: Three times a day (TID) | RESPIRATORY_TRACT | Status: DC

## 2014-11-10 MED ORDER — PANCRELIPASE (LIP-PROT-AMYL) 12000-38000 UNITS PO CPEP
2.0000 | ORAL_CAPSULE | Freq: Once | ORAL | Status: AC
  Administered 2014-11-10: 24000 [IU] via ORAL
  Filled 2014-11-10: qty 2

## 2014-11-10 MED ORDER — ROCURONIUM BROMIDE 50 MG/5ML IV SOLN
INTRAVENOUS | Status: AC
  Filled 2014-11-10: qty 2

## 2014-11-10 MED ORDER — SENNOSIDES-DOCUSATE SODIUM 8.6-50 MG PO TABS
1.0000 | ORAL_TABLET | Freq: Every evening | ORAL | Status: DC | PRN
  Filled 2014-11-10: qty 1

## 2014-11-10 MED ORDER — MONTELUKAST SODIUM 10 MG PO TABS
10.0000 mg | ORAL_TABLET | Freq: Every day | ORAL | Status: DC
  Filled 2014-11-10 (×2): qty 1

## 2014-11-10 MED ORDER — ROCURONIUM BROMIDE 50 MG/5ML IV SOLN
INTRAVENOUS | Status: AC | PRN
  Administered 2014-11-10: 100 mg via INTRAVENOUS

## 2014-11-10 MED ORDER — ACETAMINOPHEN 650 MG RE SUPP
650.0000 mg | RECTAL | Status: DC | PRN
  Administered 2014-11-11 – 2014-11-12 (×2): 650 mg via RECTAL
  Filled 2014-11-10 (×2): qty 1

## 2014-11-10 MED ORDER — PANTOPRAZOLE SODIUM 40 MG IV SOLR
40.0000 mg | Freq: Every day | INTRAVENOUS | Status: DC
Start: 2014-11-10 — End: 2014-11-19
  Administered 2014-11-10 – 2014-11-18 (×9): 40 mg via INTRAVENOUS
  Filled 2014-11-10 (×10): qty 40

## 2014-11-10 MED ORDER — ALBUTEROL SULFATE (5 MG/ML) 0.5% IN NEBU: 2.5000 mg | INHALATION_SOLUTION | Freq: Four times a day (QID) | RESPIRATORY_TRACT | Status: DC

## 2014-11-10 MED ORDER — LEVALBUTEROL HCL 0.63 MG/3ML IN NEBU
0.6300 mg | INHALATION_SOLUTION | Freq: Four times a day (QID) | RESPIRATORY_TRACT | Status: DC
  Administered 2014-11-10 – 2014-11-11 (×3): 0.63 mg via RESPIRATORY_TRACT
  Filled 2014-11-10 (×7): qty 3

## 2014-11-10 MED ORDER — DEXTROSE-NACL 5-0.45 % IV SOLN: INTRAVENOUS | Status: DC

## 2014-11-10 MED ORDER — DEXTROSE 50 % IV SOLN
25.0000 mL | INTRAVENOUS | Status: AC
  Administered 2014-11-10: 25 mL via INTRAVENOUS

## 2014-11-10 MED ORDER — ALBUTEROL SULFATE (2.5 MG/3ML) 0.083% IN NEBU
2.5000 mg | INHALATION_SOLUTION | RESPIRATORY_TRACT | Status: DC | PRN
  Administered 2014-11-11: 2.5 mg via RESPIRATORY_TRACT
  Filled 2014-11-10: qty 3

## 2014-11-10 MED ORDER — LABETALOL HCL 5 MG/ML IV SOLN
10.0000 mg | Freq: Once | INTRAVENOUS | Status: AC
  Administered 2014-11-10: 10 mg via INTRAVENOUS

## 2014-11-10 MED ORDER — LEVOTHYROXINE SODIUM 25 MCG PO TABS
25.0000 ug | ORAL_TABLET | Freq: Every day | ORAL | Status: DC
  Administered 2014-11-11 – 2014-11-25 (×14): 25 ug via ORAL
  Filled 2014-11-10 (×16): qty 1

## 2014-11-10 MED ORDER — IPRATROPIUM-ALBUTEROL 0.5-2.5 (3) MG/3ML IN SOLN: 3.0000 mL | Freq: Four times a day (QID) | RESPIRATORY_TRACT | Status: DC

## 2014-11-10 MED ORDER — BUDESONIDE 0.25 MG/2ML IN SUSP
0.2500 mg | Freq: Two times a day (BID) | RESPIRATORY_TRACT | Status: DC
  Administered 2014-11-10 – 2014-11-25 (×27): 0.25 mg via RESPIRATORY_TRACT
  Filled 2014-11-10 (×34): qty 2

## 2014-11-10 MED ORDER — SUCCINYLCHOLINE CHLORIDE 20 MG/ML IJ SOLN
INTRAMUSCULAR | Status: AC
  Filled 2014-11-10: qty 1

## 2014-11-10 MED ORDER — ANTISEPTIC ORAL RINSE SOLUTION (CORINZ)
7.0000 mL | Freq: Four times a day (QID) | OROMUCOSAL | Status: DC
  Administered 2014-11-11 – 2014-11-25 (×51): 7 mL via OROMUCOSAL

## 2014-11-10 MED ORDER — DEXTROSE 10 % IV SOLN
INTRAVENOUS | Status: DC
Start: 2014-11-10 — End: 2014-11-11
  Administered 2014-11-10: 18:00:00 via INTRAVENOUS
  Filled 2014-11-10: qty 1000

## 2014-11-10 MED ORDER — ACETAMINOPHEN 325 MG PO TABS: 650.0000 mg | ORAL_TABLET | ORAL | Status: DC | PRN

## 2014-11-10 MED ORDER — ETOMIDATE 2 MG/ML IV SOLN
INTRAVENOUS | Status: AC
  Filled 2014-11-10: qty 20

## 2014-11-10 MED ORDER — ETOMIDATE 2 MG/ML IV SOLN
INTRAVENOUS | Status: AC | PRN
  Administered 2014-11-10: 20 mg via INTRAVENOUS

## 2014-11-10 MED ORDER — ALTEPLASE (STROKE) FULL DOSE INFUSION
86.0000 mg | INTRAVENOUS | Status: AC
  Administered 2014-11-10: 86 mg via INTRAVENOUS
  Filled 2014-11-10: qty 86

## 2014-11-10 MED ORDER — STROKE: EARLY STAGES OF RECOVERY BOOK
Freq: Once | Status: AC
  Administered 2014-11-10: 16:00:00
  Filled 2014-11-10: qty 1

## 2014-11-10 MED ORDER — ANTISEPTIC ORAL RINSE SOLUTION (CORINZ): 7.0000 mL | Freq: Four times a day (QID) | OROMUCOSAL | Status: DC

## 2014-11-10 MED ORDER — FAMOTIDINE IN NACL 20-0.9 MG/50ML-% IV SOLN
20.0000 mg | Freq: Two times a day (BID) | INTRAVENOUS | Status: DC
  Administered 2014-11-10 – 2014-11-14 (×8): 20 mg via INTRAVENOUS
  Filled 2014-11-10 (×9): qty 50

## 2014-11-10 MED ORDER — DEXTROSE 50 % IV SOLN
INTRAVENOUS | Status: AC
  Administered 2014-11-10: 25 mL via INTRAVENOUS
  Filled 2014-11-10: qty 50

## 2014-11-10 MED ORDER — SODIUM CHLORIDE 0.9 % IV SOLN
INTRAVENOUS | Status: DC
  Administered 2014-11-10 – 2014-11-18 (×3): via INTRAVENOUS

## 2014-11-10 MED ORDER — IOHEXOL 350 MG/ML SOLN
50.0000 mL | Freq: Once | INTRAVENOUS | Status: AC | PRN
  Administered 2014-11-10: 50 mL via INTRAVENOUS

## 2014-11-10 MED ORDER — IPRATROPIUM BROMIDE 0.02 % IN SOLN
0.5000 mg | Freq: Four times a day (QID) | RESPIRATORY_TRACT | Status: DC
  Administered 2014-11-10 – 2014-11-11 (×3): 0.5 mg via RESPIRATORY_TRACT
  Filled 2014-11-10 (×3): qty 2.5

## 2014-11-10 MED ORDER — LABETALOL HCL 5 MG/ML IV SOLN: 10.0000 mg | INTRAVENOUS | Status: DC | PRN

## 2014-11-10 MED ORDER — FENTANYL CITRATE (PF) 100 MCG/2ML IJ SOLN: 100.0000 ug | INTRAMUSCULAR | Status: DC | PRN

## 2014-11-10 MED ORDER — CHLORHEXIDINE GLUCONATE 0.12% ORAL RINSE (MEDLINE KIT)
15.0000 mL | Freq: Two times a day (BID) | OROMUCOSAL | Status: DC
  Administered 2014-11-10 – 2014-11-12 (×5): 15 mL via OROMUCOSAL

## 2014-11-10 MED ORDER — FENTANYL CITRATE (PF) 100 MCG/2ML IJ SOLN
100.0000 ug | INTRAMUSCULAR | Status: DC | PRN
  Administered 2014-11-10 – 2014-11-20 (×14): 100 ug via INTRAVENOUS
  Filled 2014-11-10 (×14): qty 2

## 2014-11-10 NOTE — ED Provider Notes (Signed)
CSN: [id number]     Arrival date & time 11/10/14  1216 History   First MD Initiated Contact with Patient 11/10/14 1242     Chief Complaint  Patient presents with  . Code Stroke    The history is provided by the EMS personnel. No language interpreter was used.   [name] presents as a code stroke. She was in dialysis today and was found unresponsive at the end of her session. She did have a hypoglycemia during dialysis (prior to event) and was given D50 and had a sugar of 138. On the way to the hospital she was noted to have right-sided paralysis and a left-sided gaze preference. There is no seizure-like activity witnessed. Level V caveat due to unresponsiveness.  Past Medical History  Diagnosis Date  . Asthma   . Hypertension   . Gout   . Arthritis   . Insomnia   . Chronic kidney disease   . Depression   . Renal insufficiency   . Aortic aneurysm without rupture     3cm by CT 08/03/14  . Paroxysmal SVT (supraventricular tachycardia)   . Cor pulmonale   . Stroke 06/2014  . PFO (patent foramen ovale)   . COPD (chronic obstructive pulmonary disease)    Past Surgical History  Procedure Laterality Date  . Multiple tooth extractions    . Av fistula placement Left 01/28/2014    Procedure: ARTERIOVENOUS (AV) FISTULA CREATION;  Surgeon: Sherren Kerns, MD;  Location: [facility] OR;  Service: Vascular;  Laterality: Left;  . Tee without cardioversion N/A 06/23/2014    Procedure: TRANSESOPHAGEAL ECHOCARDIOGRAM (TEE);  Surgeon: Lewayne Bunting, MD;  Location: [facility] ENDOSCOPY;  Service: Cardiovascular;  Laterality: N/A;  . Insertion of dialysis catheter Right 06/27/2014    Procedure: INSERTION OF Right Internal Jugular DIATEK CATHETER;  Surgeon: Pryor Ochoa, MD;  Location: [facility] OR;  Service: Vascular;  Laterality: Right;  . Laparotomy N/A 07/01/2014    Procedure: EXPLORATORY LAPAROTOMY WITH CLOSURE OF PERFORATED PYLORIC ULCER;  Surgeon: Claud Kelp, MD;  Location: [facility] OR;  Service: General;   Laterality: N/A;  . Bowel resection N/A 07/01/2014    Procedure: SMALL BOWEL RESECTION;  Surgeon: Claud Kelp, MD;  Location: [facility] OR;  Service: General;  Laterality: N/A;  . Revison of arteriovenous fistula Left 09/09/2014    Procedure: LIGATION OF COMPETING BRANCH, & RESECTION OF VENOUS ANEURYSM OF LEFT UPPER ARM ARTERIOVENOUS FISTULA;  Surgeon: Larina Earthly, MD;  Location: [facility] OR;  Service: Vascular;  Laterality: Left;   Family History  Problem Relation Age of Onset  . Diabetes Mother   . Hypertension Mother   . Heart disease Mother   . Heart attack Mother   . Peripheral vascular disease Mother   . Diabetes Father   . Heart disease Father   . Hypertension Father   . Diabetes Sister   . Hypertension Sister   . Heart disease Sister     before age 27  . Heart attack Sister   . Peripheral vascular disease Sister   . Heart disease Brother   . Hyperlipidemia Daughter    History  Substance Use Topics  . Smoking status: Current Every Day Smoker -- 0.50 packs/day for 20 years    Types: Cigarettes  . Smokeless tobacco: Never Used  . Alcohol Use: Yes     Comment: occ   OB History    No data available     Review of Systems  Unable to perform ROS  Allergies  Heparin  Home Medications   Prior to Admission medications   Medication Sig Start Date End Date Taking? Authorizing Provider  ALPRAZolam Prudy Feeler) 0.5 MG tablet Take 1 tablet (0.5 mg total) by mouth Every Tuesday,Thursday,and Saturday with dialysis. 10/02/14   Mcarthur Rossetti Angiulli, PA-C  amiodarone (PACERONE) 200 MG tablet 2 tablets daily until 10/05/2014 then 1 tablet daily 10/02/14   Mcarthur Rossetti Angiulli, PA-C  aspirin 81 MG chewable tablet Chew 1 tablet (81 mg total) by mouth daily. 09/08/14   Zannie Cove, MD  budesonide (PULMICORT) 0.25 MG/2ML nebulizer solution Take 2 mLs (0.25 mg total) by nebulization 2 (two) times daily. 09/08/14   Zannie Cove, MD  calcitRIOL (ROCALTROL) 0.5 MCG capsule Take 1 capsule (0.5 mcg total) by  mouth every Tuesday, Thursday, and Saturday at 6 PM. 09/11/14   Albertine Grates, MD  citalopram (CELEXA) 20 MG tablet Take 1 tablet (20 mg total) by mouth daily. 10/02/14   Mcarthur Rossetti Angiulli, PA-C  collagenase (SANTYL) ointment Apply topically daily. As directed 10/06/14   Mcarthur Rossetti Angiulli, PA-C  ipratropium (ATROVENT) 0.02 % nebulizer solution Take 2.5 mLs (0.5 mg total) by nebulization 3 (three) times daily. 09/11/14   Albertine Grates, MD  levalbuterol Pauline Aus) 0.63 MG/3ML nebulizer solution Take 3 mLs (0.63 mg total) by nebulization 3 (three) times daily. 09/11/14   Albertine Grates, MD  levothyroxine (SYNTHROID, LEVOTHROID) 25 MCG tablet Take 1 tablet (25 mcg total) by mouth daily before breakfast. 10/02/14   Mcarthur Rossetti Angiulli, PA-C  midodrine (PROAMATINE) 10 MG tablet Take 1 tablet (10 mg total) by mouth 2 (two) times daily with a meal. 10/02/14   Mcarthur Rossetti Angiulli, PA-C  montelukast (SINGULAIR) 10 MG tablet Take 1 tablet (10 mg total) by mouth at bedtime. 09/11/14   Albertine Grates, MD  multivitamin (RENA-VIT) TABS tablet Take 1 tablet by mouth at bedtime. 10/02/14   Mcarthur Rossetti Angiulli, PA-C  Nutritional Supplements (FEEDING SUPPLEMENT, JEVITY 1.2 CAL,) LIQD Place 1,000 mLs into feeding tube continuous. 10/02/14   Mcarthur Rossetti Angiulli, PA-C  oxyCODONE (ROXICODONE) 5 MG immediate release tablet Take 1 tablet (5 mg total) by mouth every 6 (six) hours as needed for severe pain. 10/02/14   Mcarthur Rossetti Angiulli, PA-C  sevelamer carbonate (RENVELA) 2.4 G PACK Take 2.4 g by mouth 3 (three) times daily. 10/02/14   Mcarthur Rossetti Angiulli, PA-C  vitamin C (VITAMIN C) 250 MG tablet Take 1 tablet (250 mg total) by mouth daily. 10/02/14   Daniel J Angiulli, PA-C   BP 170/105 mmHg  Pulse 98  Resp 31  SpO2 95% Physical Exam  Constitutional: She appears well-developed. She appears distressed.  HENT:  Head: Normocephalic and atraumatic.  Small abrasion over the left upper lip  Eyes: Pupils are equal, round, and reactive to light.  Eyes deviated to the left   Cardiovascular:  Tachycardiac, no murmur  Pulmonary/Chest:  Tachypnea with grunting, rhonchi bilaterally. Vas cath in right anterior chest wall.  Abdominal: Soft. There is no tenderness. There is no rebound and no guarding.  PEG tube in left upper quadrant, clean dry and intact  Musculoskeletal:  Trace edema of lower extremities. Fistula left upper extremity with dialysis needle in place.  Neurological:  Right sided flaccid paralysis. Small amount of tone in the left upper extremity. Nonverbal. Does not follow commands. Eyes open spontaneously and deviated to the left.  Nursing note and vitals reviewed.   ED Course  Procedures (including critical care time) INTUBATION Performed by: Tilden Fossa  Required items: required blood products, implants, devices, and special equipment available Patient identity confirmed: provided demographic data and hospital-assigned identification number Time out: Immediately prior to procedure a "time out" was called to verify the correct patient, procedure, equipment, support staff and site/side marked as required.  Indications: airway protection  Intubation method: Glidescope Laryngoscopy   Preoxygenation: BVM  Sedatives: Etomidate Paralytic: rocuronium  Tube Size: 7.5 cuffed  Post-procedure assessment: chest rise and ETCO2 monitor Breath sounds: equal and absent over the epigastrium Tube secured with: ETT holder Chest x-ray interpreted by radiologist and me.  Chest x-ray findings: endotracheal tube in appropriate position  Patient tolerated the procedure well with no immediate complications.     Labs Review Labs Reviewed  PROTIME-INR - Abnormal; Notable for the following:    Prothrombin Time 16.3 (*)    All other components within normal limits  CBC - Abnormal; Notable for the following:    RDW 19.8 (*)    All other components within normal limits  DIFFERENTIAL - Abnormal; Notable for the following:    Lymphocytes Relative 9  (*)    Lymphs Abs 0.6 (*)    Monocytes Relative 21 (*)    Monocytes Absolute 1.4 (*)    All other components within normal limits  I-STAT CHEM 8, ED - Abnormal; Notable for the following:    Sodium 134 (*)    Potassium 3.3 (*)    Chloride 97 (*)    BUN 28 (*)    Creatinine, Ser 5.30 (*)    Calcium, Ion 0.95 (*)    Hemoglobin 15.3 (*)    All other components within normal limits  APTT  ETHANOL  COMPREHENSIVE METABOLIC PANEL  URINE RAPID DRUG SCREEN, HOSP PERFORMED  URINALYSIS, ROUTINE W REFLEX MICROSCOPIC (NOT AT [facility])  I-STAT TROPOININ, ED    Imaging Review Ct Angio Head W/cm &/or Wo Cm  11/10/2014   CLINICAL DATA:  Sudden onset right-sided weakness and left gaze deviation after dialysis today. Code stroke head CT earlier today. TPA administered afterward.  EXAM: CT ANGIOGRAPHY HEAD AND NECK  TECHNIQUE: Multidetector CT imaging of the head and neck was performed using the standard protocol during bolus administration of intravenous contrast. Multiplanar CT image reconstructions and MIPs were obtained to evaluate the vascular anatomy. Carotid stenosis measurements (when applicable) are obtained utilizing NASCET criteria, using the distal internal carotid diameter as the denominator.  CONTRAST:  50mL OMNIPAQUE IOHEXOL 350 MG/ML SOLN  COMPARISON:  Head CT 11/10/2014 and MRI/MRA 06/20/2014  FINDINGS: CTA NECK  Aortic arch: 3 vessel aortic arch with mild atherosclerotic calcification. Brachiocephalic and subclavian arteries are patent without stenosis.  Right carotid system: Patent with moderate calcified plaque involving the carotid bifurcation and proximal ICA without stenosis. Mid cervical ICA is mildly tortuous.  Left carotid system: Patent with mild-to-moderate calcified plaque at the carotid bifurcation without stenosis. Proximal ICA is tortuous.  Vertebral arteries: Patent and balanced in size. There is mild stenosis of the left vertebral artery at its origin. Scattered calcified plaque is  present throughout the course of both vertebral arteries without evidence of significant additional stenosis.  Skeleton: No acute osseous abnormality.  Other neck: Visualized lung apices demonstrate centrilobular emphysema as well as patchy subpleural opacities in the posterior right upper lobe and superior segment of the right lower lobe which may represent atelectasis. Right jugular central venous catheter terminates in the SVC. An endotracheal tube is present. Fluid/secretions are present in the nasal cavity, nasopharynx and oropharynx. Polypoid mucosal thickening/mucous retention cyst  is noted in the left sphenoid sinus.  CTA HEAD  Anterior circulation: Internal carotid arteries are patent from skullbase to carotid termini. There is carotid siphon calcification bilaterally with at most mild cavernous carotid stenosis bilaterally. Right MCA is unremarkable. Left M1 segment is patent without stenosis. Left MCA bifurcation is patent, however there is occlusion of an M2 superior division branch vessel approximately 2 cm distal to the MCA bifurcation. There is likely developing cytotoxic edema distal to this occlusion involving the left frontal operculum and more superior, posterior left frontal lobe. ACAs are patent without proximal stenosis, although there is moderate irregularity and attenuation of more distal A2 and A3 segments bilaterally, increased from prior MRA. No intracranial aneurysm is identified.  Posterior circulation: Intracranial vertebral arteries are patent to the basilar. There is mild right vertebral artery calcified plaque without stenosis. PICA origins are patent. Basilar artery is patent and mildly dolichoectatic without stenosis. Right AICA appears patent at its origin but with moderate to severe proximal tandem stenoses. SCA origins are patent. Posterior communicating arteries are not identified. PCAs are patent with mild branch vessel irregularity but no proximal stenosis.  Venous sinuses:  Superior sagittal sinus is grossly patent. Transverse and sigmoid sinuses are not well evaluated due to arterial phase contrast timing.  Anatomic variants: None.  Delayed phase: Not performed at direction of stroke neurologist.  IMPRESSION: 1. 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 2. No proximal large vessel occlusion. 3. Moderate irregular narrowing and attenuation of ACA branch vessels, progressed from prior MRA. 4. Mild-to-moderate plaque in the cervical carotid arteries without stenosis. 5. Mild proximal left vertebral artery stenosis.  These results were called by telephone at the time of interpretation on 11/10/2014 at 3:03 pm to [facility] , who verbally acknowledged these results.   Electronically Signed   By: Sebastian Ache   On: 11/10/2014 15:13   Ct Head Wo Contrast  11/10/2014   CLINICAL DATA:  Code stroke.  Right-sided weakness.  EXAM: CT HEAD WITHOUT CONTRAST  TECHNIQUE: Contiguous axial images were obtained from the base of the skull through the vertex without intravenous contrast.  COMPARISON:  06/19/2014, MR brain 06/20/2014  FINDINGS: There is no evidence of mass effect, midline shift or extra-axial fluid collections. There is no evidence of a space-occupying lesion or intracranial hemorrhage. There is no evidence of a cortical-based area of acute infarction. There is an old right ACA territory infarct with encephalomalacia. There is a small left ACA territory infarct with encephalomalacia. There is mild periventricular white matter low attenuation as can be seen with chronic microvascular disease.  The ventricles and sulci are appropriate for the patient's age. The basal cisterns are patent.  Visualized portions of the orbits are unremarkable. The visualized portions of the paranasal sinuses and mastoid air cells are unremarkable.  The osseous structures are unremarkable.  IMPRESSION: 1. No acute intracranial pathology.   Electronically Signed   By: Elige Ko   On:  11/10/2014 13:21   Ct Angio Neck W/cm &/or Wo/cm  11/10/2014   CLINICAL DATA:  Sudden onset right-sided weakness and left gaze deviation after dialysis today. Code stroke head CT earlier today. TPA administered afterward.  EXAM: CT ANGIOGRAPHY HEAD AND NECK  TECHNIQUE: Multidetector CT imaging of the head and neck was performed using the standard protocol during bolus administration of intravenous contrast. Multiplanar CT image reconstructions and MIPs were obtained to evaluate the vascular anatomy. Carotid stenosis measurements (when applicable) are obtained utilizing NASCET criteria, using  the distal internal carotid diameter as the denominator.  CONTRAST:  50mL OMNIPAQUE IOHEXOL 350 MG/ML SOLN  COMPARISON:  Head CT 11/10/2014 and MRI/MRA 06/20/2014  FINDINGS: CTA NECK  Aortic arch: 3 vessel aortic arch with mild atherosclerotic calcification. Brachiocephalic and subclavian arteries are patent without stenosis.  Right carotid system: Patent with moderate calcified plaque involving the carotid bifurcation and proximal ICA without stenosis. Mid cervical ICA is mildly tortuous.  Left carotid system: Patent with mild-to-moderate calcified plaque at the carotid bifurcation without stenosis. Proximal ICA is tortuous.  Vertebral arteries: Patent and balanced in size. There is mild stenosis of the left vertebral artery at its origin. Scattered calcified plaque is present throughout the course of both vertebral arteries without evidence of significant additional stenosis.  Skeleton: No acute osseous abnormality.  Other neck: Visualized lung apices demonstrate centrilobular emphysema as well as patchy subpleural opacities in the posterior right upper lobe and superior segment of the right lower lobe which may represent atelectasis. Right jugular central venous catheter terminates in the SVC. An endotracheal tube is present. Fluid/secretions are present in the nasal cavity, nasopharynx and oropharynx. Polypoid mucosal  thickening/mucous retention cyst is noted in the left sphenoid sinus.  CTA HEAD  Anterior circulation: Internal carotid arteries are patent from skullbase to carotid termini. There is carotid siphon calcification bilaterally with at most mild cavernous carotid stenosis bilaterally. Right MCA is unremarkable. Left M1 segment is patent without stenosis. Left MCA bifurcation is patent, however there is occlusion of an M2 superior division branch vessel approximately 2 cm distal to the MCA bifurcation. There is likely developing cytotoxic edema distal to this occlusion involving the left frontal operculum and more superior, posterior left frontal lobe. ACAs are patent without proximal stenosis, although there is moderate irregularity and attenuation of more distal A2 and A3 segments bilaterally, increased from prior MRA. No intracranial aneurysm is identified.  Posterior circulation: Intracranial vertebral arteries are patent to the basilar. There is mild right vertebral artery calcified plaque without stenosis. PICA origins are patent. Basilar artery is patent and mildly dolichoectatic without stenosis. Right AICA appears patent at its origin but with moderate to severe proximal tandem stenoses. SCA origins are patent. Posterior communicating arteries are not identified. PCAs are patent with mild branch vessel irregularity but no proximal stenosis.  Venous sinuses: Superior sagittal sinus is grossly patent. Transverse and sigmoid sinuses are not well evaluated due to arterial phase contrast timing.  Anatomic variants: None.  Delayed phase: Not performed at direction of stroke neurologist.  IMPRESSION: 1. 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 2. No proximal large vessel occlusion. 3. Moderate irregular narrowing and attenuation of ACA branch vessels, progressed from prior MRA. 4. Mild-to-moderate plaque in the cervical carotid arteries without stenosis. 5. Mild proximal left vertebral artery stenosis.   These results were called by telephone at the time of interpretation on 11/10/2014 at 3:03 pm to [facility] , who verbally acknowledged these results.   Electronically Signed   By: Sebastian Ache   On: 11/10/2014 15:13   Dg Chest Portable 1 View  11/10/2014   CLINICAL DATA:  ETT placement  EXAM: PORTABLE CHEST - 1 VIEW  COMPARISON:  09/16/2014  FINDINGS: Endotracheal tube terminates 3 cm above the carina.  Cardiomegaly with pulmonary vascular congestion. No frank interstitial edema. No pleural effusion or pneumothorax.  Right IJ dual lumen dialysis catheter terminates in the SVC.  IMPRESSION: Endotracheal tube terminates 3 cm above the carina.   Electronically Signed  By: Charline Bills M.D.   On: 11/10/2014 13:03     EKG Interpretation None      MDM   Final diagnoses:  CVA (cerebral infarction)  CVA (cerebral infarction)  CVA (cerebral infarction)  Acute respiratory failure with hypoxemia    Patient brought in for altered mental status with unresponsiveness from dialysis. Patient with right-sided hemiparesis and left sided gaze preference the emergency department. Patient was intubated for airway protection prior to CT head. Patient was given Rocuronium as paralytic given end-stage renal disease. Patient was evaluated as a code stroke and evaluated by neurology. Plan to admit to neurology for further workup and management. Dialysis catheter was  De-accessed by dialysis nurse.    Tilden Fossa, MD 11/11/14 [phone]

## 2014-11-10 NOTE — Progress Notes (Signed)
MD notified that patient started to respond to pain.  He indicated to continue to monitor and later ordered a repeat head CT.  Will continue to monitor patient.

## 2014-11-10 NOTE — Progress Notes (Signed)
eLink Physician-Brief Progress Note Patient Name: [name] DOB: Dec 09, 1955 MRN: [id number]   Date of Service  11/10/2014  HPI/Events of Note  PEG tube clogged.   eICU Interventions  Will order Pancrease/NaHCO3 protocol.     Intervention Category Minor Interventions: Routine modifications to care plan (e.g. PRN medications for pain, fever)  Sommer,Steven Eugene 11/10/2014, 6:53 PM

## 2014-11-10 NOTE — ED Notes (Signed)
X-ray at bedside

## 2014-11-10 NOTE — Progress Notes (Signed)
Patient was intubated for airway protection, by ED physician.  Positive color change.  Bilateral breath sounds noted.  Chest xray pending for tube placement.  Will obtain follow up ABG.  Will continue to monitor.

## 2014-11-10 NOTE — Consult Note (Signed)
PULMONARY / CRITICAL CARE MEDICINE   Name: [name] MRN: [id number] DOB: 28-Nov-1955    ADMISSION DATE:  11/10/2014 CONSULTATION DATE:  11/10/2014  REFERRING MD :  Pearlean Brownie  CHIEF COMPLAINT:  Stroke, post TPA  INITIAL PRESENTATION: 59 y/o female with multiple medical problems admitted 8/9 with what is believed to be a left sided stroke, intubated and received IV TPA. PCCM consulted for vent management.   STUDIES:  8/9 CT head > no acute abnormality 8/9 CT angiogram post tpa> L MCA m2 superior division branch vessel occlusion, moderate irregular narrowing and attenuation of ACA branch vessels, progressed from prior MRA  SIGNIFICANT EVENTS: 8/9 admission, IV TPA for L MCA stroke   HISTORY OF PRESENT ILLNESS:  This is a 59 y/o female who had an acute left MCA stroke today and received IV TPA.  She was intubated for airway protection in the ER.  She has a long history of ESRD, a recent R ACA stroke, and a recent history of ischemic bowel requiring resection.  Apparently today at dialysis she developed sudden onset of R sided weakness.  She was brought to the ER for further evaluation.  No further history could be obtained due to intubation.  Of note, after TPA she had bleeding from her AV fistula  PAST MEDICAL HISTORY :   has a past medical history of Asthma; Hypertension; Gout; Arthritis; Insomnia; Chronic kidney disease; Depression; Renal insufficiency; Aortic aneurysm without rupture; Paroxysmal SVT (supraventricular tachycardia); Cor pulmonale; Stroke (06/2014); PFO (patent foramen ovale); and COPD (chronic obstructive pulmonary disease).  has past surgical history that includes Multiple tooth extractions; AV fistula placement (Left, 01/28/2014); TEE without cardioversion (N/A, 06/23/2014); Insertion of dialysis catheter (Right, 06/27/2014); laparotomy (N/A, 07/01/2014); Bowel resection (N/A, 07/01/2014); and Revison of arteriovenous fistula (Left, 09/09/2014). Prior to Admission medications    Medication Sig Start Date End Date Taking? Authorizing Provider  ALPRAZolam Prudy Feeler) 0.5 MG tablet Take 1 tablet (0.5 mg total) by mouth Every Tuesday,Thursday,and Saturday with dialysis. 10/02/14   Mcarthur Rossetti Angiulli, PA-C  amiodarone (PACERONE) 200 MG tablet 2 tablets daily until 10/05/2014 then 1 tablet daily 10/02/14   Mcarthur Rossetti Angiulli, PA-C  aspirin 81 MG chewable tablet Chew 1 tablet (81 mg total) by mouth daily. 09/08/14   Zannie Cove, MD  budesonide (PULMICORT) 0.25 MG/2ML nebulizer solution Take 2 mLs (0.25 mg total) by nebulization 2 (two) times daily. 09/08/14   Zannie Cove, MD  calcitRIOL (ROCALTROL) 0.5 MCG capsule Take 1 capsule (0.5 mcg total) by mouth every Tuesday, Thursday, and Saturday at 6 PM. 09/11/14   Albertine Grates, MD  citalopram (CELEXA) 20 MG tablet Take 1 tablet (20 mg total) by mouth daily. 10/02/14   Mcarthur Rossetti Angiulli, PA-C  collagenase (SANTYL) ointment Apply topically daily. As directed 10/06/14   Mcarthur Rossetti Angiulli, PA-C  ipratropium (ATROVENT) 0.02 % nebulizer solution Take 2.5 mLs (0.5 mg total) by nebulization 3 (three) times daily. 09/11/14   Albertine Grates, MD  levalbuterol Pauline Aus) 0.63 MG/3ML nebulizer solution Take 3 mLs (0.63 mg total) by nebulization 3 (three) times daily. 09/11/14   Albertine Grates, MD  levothyroxine (SYNTHROID, LEVOTHROID) 25 MCG tablet Take 1 tablet (25 mcg total) by mouth daily before breakfast. 10/02/14   Mcarthur Rossetti Angiulli, PA-C  midodrine (PROAMATINE) 10 MG tablet Take 1 tablet (10 mg total) by mouth 2 (two) times daily with a meal. 10/02/14   Mcarthur Rossetti Angiulli, PA-C  montelukast (SINGULAIR) 10 MG tablet Take 1 tablet (10 mg total) by mouth  at bedtime. 09/11/14   Albertine Grates, MD  multivitamin (RENA-VIT) TABS tablet Take 1 tablet by mouth at bedtime. 10/02/14   Mcarthur Rossetti Angiulli, PA-C  Nutritional Supplements (FEEDING SUPPLEMENT, JEVITY 1.2 CAL,) LIQD Place 1,000 mLs into feeding tube continuous. 10/02/14   Mcarthur Rossetti Angiulli, PA-C  oxyCODONE (ROXICODONE) 5 MG immediate release  tablet Take 1 tablet (5 mg total) by mouth every 6 (six) hours as needed for severe pain. 10/02/14   Mcarthur Rossetti Angiulli, PA-C  sevelamer carbonate (RENVELA) 2.4 G PACK Take 2.4 g by mouth 3 (three) times daily. 10/02/14   Mcarthur Rossetti Angiulli, PA-C  vitamin C (VITAMIN C) 250 MG tablet Take 1 tablet (250 mg total) by mouth daily. 10/02/14   Mcarthur Rossetti Angiulli, PA-C   Allergies  Allergen Reactions  . Heparin Other (See Comments)    Heparin antibody positive; SRA negative    FAMILY HISTORY:  has no family status information on file.  SOCIAL HISTORY:  reports that she has been smoking Cigarettes.  She has a 10 pack-year smoking history. She has never used smokeless tobacco. She reports that she drinks alcohol. She reports that she does not use illicit drugs.  REVIEW OF SYSTEMS:  Cannot obtain due to intubation  SUBJECTIVE:   VITAL SIGNS: Temp:  [98.6 F (37 C)-99 F (37.2 C)] 98.6 F (37 C) (08/09 1515) Pulse Rate:  [82-107] 82 (08/09 1515) Resp:  [11-35] 18 (08/09 1515) BP: (135-189)/(79-126) 147/83 mmHg (08/09 1515) SpO2:  [89 %-100 %] 95 % (08/09 1515) FiO2 (%):  [40 %-100 %] 40 % (08/09 1427) Weight:  [95 kg (209 lb 7 oz)] 95 kg (209 lb 7 oz) (08/09 1239) HEMODYNAMICS:   VENTILATOR SETTINGS: Vent Mode:  [-] PRVC FiO2 (%):  [40 %-100 %] 40 % Set Rate:  [14 bmp-18 bmp] 18 bmp Vt Set:  [570 mL] 570 mL PEEP:  [5 cmH20] 5 cmH20 Plateau Pressure:  [24 cmH20] 24 cmH20 INTAKE / OUTPUT: No intake or output data in the 24 hours ending 11/10/14 1535  PHYSICAL EXAMINATION: General:  Chronically ill appearing, on vent Neuro:  Has weak cough to tracheal suctioning, otherwise not responding to my exam HEENT:  NCAT, ETT in place Cardiovascular:  RRR, systolic murmur noted, left upper arm AV fistula > dressed Lungs:  Vent supported breaths, clear to auscultation Abdomen:  BS+, soft, nontender Musculoskeletal:  Diminished tone on my exam Skin:  R IJ perm cath> clean, PEG >  clean  LABS:  CBC  Recent Labs Lab 11/10/14 1220 11/10/14 1225  WBC 6.8  --   HGB 12.0 15.3*  HCT 36.9 45.0  PLT 202  --    Coag's  Recent Labs Lab 11/10/14 1220  APTT 35  INR 1.29   BMET  Recent Labs Lab 11/10/14 1220 11/10/14 1225  NA 135 134*  K 3.4* 3.3*  CL 97* 97*  CO2 23  --   BUN 25* 28*  CREATININE 5.66* 5.30*  GLUCOSE 84 81   Electrolytes  Recent Labs Lab 11/10/14 1220  CALCIUM 8.0*   Sepsis Markers No results for input(s): LATICACIDVEN, PROCALCITON, O2SATVEN in the last 168 hours. ABG  Recent Labs Lab 11/10/14 1325  PHART 7.331*  PCO2ART 52.3*  PO2ART 409.0*   Liver Enzymes  Recent Labs Lab 11/10/14 1220  AST 23  ALT 16  ALKPHOS 74  BILITOT 1.3*  ALBUMIN 3.0*   Cardiac Enzymes No results for input(s): TROPONINI, PROBNP in the last 168 hours. Glucose  Recent Labs Lab  11/10/14 1232 11/10/14 1320  GLUCAP 78 70    Imaging  8/9 CXR > cardiomegally, ETT in place, lungs clear   ASSESSMENT / PLAN:  PULMONARY OETT 8/9 >  A:Intubated for airway protection P:   Full vent support VAP bundle Daily SBT/WUA Daily CXR  CARDIOVASCULAR  A: Hypertension Pulmonary hypertension by echo> cause uncertain, has not had RHC PFO History of aortic aneurysm  P:  BP management post TPA per neurology Tele  RENAL A:  ESRD P:   Needs R IJ perm cath removed this admission Renal consult 8/10  GASTROINTESTINAL A:  Recent ischemic bowel, perforation, treated with surgery in [place] 2016 No acute issues now P:   NPO for now Consider tube feedings via gastric tube if not extubated tomorrow  HEMATOLOGIC A:  Just received TPA P:  Monitor for bleeding CBC in AM  INFECTIOUS A:  No acute issues P:   Monitor for fever  ENDOCRINE A:  Hypoglycemia P:   D10 @ 30cc/hr  NEUROLOGIC A:  Acute L MCA stroke Acute encephalopathy post stroke> med related from intubation? ICH? P:   Hold sedation now Neuro to re-evaluate re  encephalopathy, may need repeat head CT If waking up, use PAD protocol with intermittent fentanyl titrated to RASS 0   FAMILY  - Updates: sister in law updated bedside 8/9  - Inter-disciplinary family meet or Palliative Care meeting due by:  7; would consider their involvement sooner as this lady has a long list of comorbid illnesses and palliative medicine was heavily involved in her care when she was hospitalized with bowel ischemia in [place].  My cc time 45 minutes  Heber Ashaway, MD [facility] Pager: [phone] Cell: [phone] After 3pm or if no response, call [phone]   11/10/2014, 3:35 PM

## 2014-11-10 NOTE — Progress Notes (Signed)
Paged to ED Trauma C to remove HD needles from left upper arm graft/fistula.   Both needles removed without incident.   Hemostasis achieved after 20 minutes of pressure.   Pressure drsgs applied to both sites.   Instructed staff to watch these sites closely for bleeding.   They started TPA infusion.   Dr. Roseanne Reno was in attendance.   I circled a dime sized area of blood on upper drsg with an ink pin and wrote the time on there so it could be monitored for further bleeding.   Staff made a ware of this.  Pt then taken immediately to CT scan.

## 2014-11-10 NOTE — ED Notes (Signed)
Per EMS- pt was from dialysis after completing dialysis. Pt was reported to have CBG of 50 to 138 after being given d50 at dialysis. Pt then had complete rt sided paralysis. Pt would follow some commands with EMS. BP 250/210.

## 2014-11-10 NOTE — Code Documentation (Signed)
59yo female arriving to [facility] via [facility] at 1216.  EMS reports that the patient was at dialysis when she became unresponsive with right sided paralysis.  Patient had missed her dialysis treatment on Saturday d/t not feeling well, but was better today and received her treatment.  She was at her baseline at 1106 per dialysis staff and then they noticed her to be slumped over at 1115.  Patient had an episode of hypoglycemia with CBG 50 which was treated with D50 and increased to 138.  EMS report CBG 98.  Stroke team at the bedside on arrival.  Labs drawn.  Dr. Madilyn Hook at the bedside on arrival.  Patient noted to be audibly wheezing with increased work of breathing.  EMS report that Albuterol neb was given en route.  Patient directly to Trauma C.  NIHSS 31, see documentation for details and code stroke times.  Patient with left gaze, nonverbal and right hemiplegia. Patient received RSI and was emergently intubated by Dr. Madilyn Hook.  Propofol gtt initiated.  PCXR completed and patient to CT.  Dr. Roseanne Reno at the bedside.  Decision to treat with tPA made. Pharmacy at the bedside and notified to mix tPA at 1304.  Patient with right AV fistula accessed from dialysis.  IV team contacted for STAT de-access.  2nd PIV placed with ultrasound.  tPA delivered to the bedside at 1311.  IV team to the bedside to remove AV fistula access.  Foley catheter placed per MD order.  CBG 70.  Labetalol  IVP given per MD order.  IV team holding pressure on access site and placed dressing.  tPA  bolus given at 1340 followed by /hr for a total of  per pharmacy dosing.  Patient to radiology with team for CTA head and neck.  Patient monitored frequently per post-tPA protocol.  BP remained within tPA parameters until handoff to 11M (VS not captured in EMR d/t equipment).  AV fistula site monitored frequently.  Patient is not an endovascular candidate per Dr. Roseanne Reno.  Patient transferred to 11M04 with ED RN, RT and Stroke RN.  Bedside report given  to [place], RN on [place].  Patient assessed during handoff and AV fistula site now oozing.  Dr. Roseanne Reno on the unit and made aware. ED RN holding pressure.  Patient not withdrawing to pain and Propofol stopped, pupils equal and reactive.  Patient reassessed and not withdrawing to pain.  AV fistula site continuing to ooze.  BP stable.  Dr. Roseanne Reno paged and made aware of AV fistula site bleeding and subglottic tube having bloody secretions.  MD to the bedside.  tPA infusion complete.  AV fistula site redressed with assistance from MD.  MD aware of neuro exam, bedside RN to continue to monitor and call MD per MD instructions.  Bedside handoff with Toma Copier, RN on 11M.

## 2014-11-10 NOTE — Progress Notes (Addendum)
Pt noted to have some bleeding from in suction tubing when subglottic suction applied. MD aware and visualized.    15:44 bleeding has increased in subglottic suction and some oral bleeding noted as well. CCM aware and visualized. Also, no further bleeding noted from pt's fistula site on L arm and radial pulse palpable.

## 2014-11-10 NOTE — ED Notes (Signed)
Pt was monitored by this RN and Sammuel Cooper while in CTA and transported to 3100. Pt was on portable monitor during transport and tPA administaration. PTs VSS during this time.

## 2014-11-10 NOTE — Progress Notes (Signed)
eLink Physician-Brief Progress Note Patient Name: [name] DOB: 07/24/1955 MRN: [id number]   Date of Service  11/10/2014  HPI/Events of Note  Pt having apparent vagal episodes when she coughs on vent, has gone as low as the 20's  eICU Interventions  - will d/c labetalol prn - atropine available at bedside - will assess on PSV to see if this mode is more comfortable, will decrease the coughing episodes.      Intervention Category Intermediate Interventions: Arrhythmia - evaluation and management  Israel Wunder S. 11/10/2014, 11:55 PM

## 2014-11-10 NOTE — H&P (Signed)
H&P    Chief Complaint: Code stroke  HPI:                                                                                                                                         [name] is an 59 y.o. female who was at dialysis and finished her session. After dialysis while still at the center she was noted to have sudden onset right sided weakness, left gaze deviation. EMS was called and on site BG was noted to be 50.  Patient was given D50 and was transported to [facility] ED. On arrival she had labored respirations was not protecting her airway, and required intubation. CT of head was delayed due to need for intubation.  CT head obtained and showed no acte stroke or bleed. Patient was deemed to be a candidate for TPA and was brought back to the ED. Dialysis needles were still present in the patient's AV fistula. IV team was assaulted and removed the hemodialysis needles as well as achieved hemostasis.  tPA was then administered IV. CT angiogram of the head and neck was obtained which showed no large vessel proximal occlusion. She was subsequently admitted to the neuro intensive care unit for further management.   Date last known well: Date: 11/10/2014 Time last known well: Time: 11:06 a.m. tPA Given: Yes     Past Medical History  Diagnosis Date  . Asthma   . Hypertension   . Gout   . Arthritis   . Insomnia   . Chronic kidney disease   . Depression   . Renal insufficiency   . Aortic aneurysm without rupture     3cm by CT 08/03/14  . Paroxysmal SVT (supraventricular tachycardia)   . Cor pulmonale   . Stroke 06/2014  . PFO (patent foramen ovale)   . COPD (chronic obstructive pulmonary disease)     Past Surgical History  Procedure Laterality Date  . Multiple tooth extractions    . Av fistula placement Left 01/28/2014    Procedure: ARTERIOVENOUS (AV) FISTULA CREATION;  Surgeon: Sherren Kerns, MD;  Location: [facility] OR;  Service: Vascular;  Laterality: Left;  . Tee without  cardioversion N/A 06/23/2014    Procedure: TRANSESOPHAGEAL ECHOCARDIOGRAM (TEE);  Surgeon: Lewayne Bunting, MD;  Location: [facility] ENDOSCOPY;  Service: Cardiovascular;  Laterality: N/A;  . Insertion of dialysis catheter Right 06/27/2014    Procedure: INSERTION OF Right Internal Jugular DIATEK CATHETER;  Surgeon: Pryor Ochoa, MD;  Location: [facility] OR;  Service: Vascular;  Laterality: Right;  . Laparotomy N/A 07/01/2014    Procedure: EXPLORATORY LAPAROTOMY WITH CLOSURE OF PERFORATED PYLORIC ULCER;  Surgeon: Claud Kelp, MD;  Location: [facility] OR;  Service: General;  Laterality: N/A;  . Bowel resection N/A 07/01/2014    Procedure: SMALL BOWEL RESECTION;  Surgeon: Claud Kelp, MD;  Location: [facility] OR;  Service: General;  Laterality: N/A;  . Revison  of arteriovenous fistula Left 09/09/2014    Procedure: LIGATION OF COMPETING BRANCH, & RESECTION OF VENOUS ANEURYSM OF LEFT UPPER ARM ARTERIOVENOUS FISTULA;  Surgeon: Larina Earthly, MD;  Location: [facility] OR;  Service: Vascular;  Laterality: Left;    Family History  Problem Relation Age of Onset  . Diabetes Mother   . Hypertension Mother   . Heart disease Mother   . Heart attack Mother   . Peripheral vascular disease Mother   . Diabetes Father   . Heart disease Father   . Hypertension Father   . Diabetes Sister   . Hypertension Sister   . Heart disease Sister     before age 17  . Heart attack Sister   . Peripheral vascular disease Sister   . Heart disease Brother   . Hyperlipidemia Daughter    Social History:  reports that she has been smoking Cigarettes.  She has a 10 pack-year smoking history. She has never used smokeless tobacco. She reports that she drinks alcohol. She reports that she does not use illicit drugs.  Allergies:  Allergies  Allergen Reactions  . Heparin Other (See Comments)    Heparin antibody positive; SRA negative    Medications:                                                                                                                            Current Facility-Administered Medications  Medication Dose Route Frequency Provider Last Rate Last Dose  . albuterol (PROVENTIL) (5 MG/ML) 0.5% nebulizer solution 2.5 mg  2.5 mg Nebulization Q6H Ulice Dash, PA-C      . alteplase (ACTIVASE) 1 mg/mL infusion 86 mg  86 mg Intravenous STAT Noel Christmas      . budesonide (PULMICORT) nebulizer solution 0.25 mg  0.25 mg Nebulization BID Ulice Dash, PA-C      . etomidate (AMIDATE) 2 MG/ML injection           . ipratropium (ATROVENT) nebulizer solution 0.5 mg  0.5 mg Nebulization TID Ulice Dash, PA-C      . labetalol (NORMODYNE,TRANDATE) 5 MG/ML injection           . [START ON 11/11/2014] levothyroxine (SYNTHROID, LEVOTHROID) tablet 25 mcg  25 mcg Oral QAC breakfast Ulice Dash, PA-C      . lidocaine (cardiac) 100 mg/12ml (XYLOCAINE) 20 MG/ML injection 2%           . montelukast (SINGULAIR) tablet 10 mg  10 mg Oral QHS Ulice Dash, PA-C      . propofol (DIPRIVAN) 1000 MG/100ML infusion           . rocuronium (ZEMURON) 50 MG/5ML injection           . succinylcholine (ANECTINE) 20 MG/ML injection            Current Outpatient Prescriptions  Medication Sig Dispense Refill  . ALPRAZolam (XANAX) 0.5 MG tablet Take 1 tablet (0.5 mg total) by mouth  Every Tuesday,Thursday,and Saturday with dialysis. 30 tablet 0  . amiodarone (PACERONE) 200 MG tablet 2 tablets daily until 10/05/2014 then 1 tablet daily 60 tablet 1  . aspirin 81 MG chewable tablet Chew 1 tablet (81 mg total) by mouth daily.    . budesonide (PULMICORT) 0.25 MG/2ML nebulizer solution Take 2 mLs (0.25 mg total) by nebulization 2 (two) times daily. 60 mL 12  . calcitRIOL (ROCALTROL) 0.5 MCG capsule Take 1 capsule (0.5 mcg total) by mouth every Tuesday, Thursday, and Saturday at 6 PM. 30 capsule 3  . citalopram (CELEXA) 20 MG tablet Take 1 tablet (20 mg total) by mouth daily. 30 tablet 1  . collagenase (SANTYL) ointment Apply topically daily. As directed 15 g 0  .  ipratropium (ATROVENT) 0.02 % nebulizer solution Take 2.5 mLs (0.5 mg total) by nebulization 3 (three) times daily. 75 mL 12  . levalbuterol (XOPENEX) 0.63 MG/3ML nebulizer solution Take 3 mLs (0.63 mg total) by nebulization 3 (three) times daily. 3 mL 12  . levothyroxine (SYNTHROID, LEVOTHROID) 25 MCG tablet Take 1 tablet (25 mcg total) by mouth daily before breakfast. 30 tablet 1  . midodrine (PROAMATINE) 10 MG tablet Take 1 tablet (10 mg total) by mouth 2 (two) times daily with a meal. 60 tablet 0  . montelukast (SINGULAIR) 10 MG tablet Take 1 tablet (10 mg total) by mouth at bedtime. 30 tablet 3  . multivitamin (RENA-VIT) TABS tablet Take 1 tablet by mouth at bedtime. 30 tablet 0  . Nutritional Supplements (FEEDING SUPPLEMENT, JEVITY 1.2 CAL,) LIQD Place 1,000 mLs into feeding tube continuous. 1000 mL 0  . oxyCODONE (ROXICODONE) 5 MG immediate release tablet Take 1 tablet (5 mg total) by mouth every 6 (six) hours as needed for severe pain. 60 tablet 0  . sevelamer carbonate (RENVELA) 2.4 G PACK Take 2.4 g by mouth 3 (three) times daily. 90 each 0  . vitamin C (VITAMIN C) 250 MG tablet Take 1 tablet (250 mg total) by mouth daily. 30 tablet 1     ROS:                                                                                                                                       Unobtainable due to patient's acute stroke with global aphasia..  Neurologic Examination:                                                                                                      Blood pressure  189/118, pulse 107, resp. rate 14, weight 95 kg (209 lb 7 oz), SpO2 100 %.  HEENT-  Normocephalic, no lesions, without obvious abnormality.  Normal external eye and conjunctiva.  Normal TM's bilaterally.  Normal auditory canals and external ears. Normal external nose, mucus membranes and septum.  Normal pharynx. Cardiovascular- S1, S2 normal, pulses palpable throughout   Lungs- chest clear, no wheezing, rales,  normal symmetric air entry Abdomen- normal findings: bowel sounds normal Extremities- no joint deformities, effusion, or inflammation Lymph-no adenopathy palpable Musculoskeletal-no joint tenderness, deformity or swelling Skin-warm and dry, no hyperpigmentation, vitiligo, or suspicious lesions  Neurological Examination Mental Status: Patient is obtunded, follows no commands, left gaze deviation.   Cranial Nerves: II:  patient did blink to threat on the left, pupils equal, round, reactive to light and accommodation III,IV, VI: ptosis not present, extra-ocular motions intact bilaterally V,VII: smile symmetric, facial light touch unable to obtain VIII: Moderate right facial droop  IX,X: unable to assess [id number] to assess XII: unable to assess Motor: Right arm and leg were flaccid on initial arrival with minimal movement and increased tone of the left arm and leg. Patient had nonpurposeful movements of left upper extremity. Sensory: unable to assess due to AMS.  Deep Tendon Reflexes: Absent throughout.  Plantars: Mute bilaterally Cerebellar: Unable to assess due to AMS        Lab Results: Basic Metabolic Panel:  Recent Labs Lab 11/10/14 1220 11/10/14 1225  NA 135 134*  K 3.4* 3.3*  CL 97* 97*  CO2 23  --   GLUCOSE 84 81  BUN 25* 28*  CREATININE 5.66* 5.30*  CALCIUM 8.0*  --     Liver Function Tests:  Recent Labs Lab 11/10/14 1220  AST 23  ALT 16  ALKPHOS 74  BILITOT 1.3*  PROT 7.5  ALBUMIN 3.0*   No results for input(s): LIPASE, AMYLASE in the last 168 hours. No results for input(s): AMMONIA in the last 168 hours.  CBC:  Recent Labs Lab 11/10/14 1220 11/10/14 1225  WBC 6.8  --   NEUTROABS 4.6  --   HGB 12.0 15.3*  HCT 36.9 45.0  MCV 90.2  --   PLT 202  --     Cardiac Enzymes: No results for input(s): CKTOTAL, CKMB, CKMBINDEX, TROPONINI in the last 168 hours.  Lipid Panel: No results for input(s): CHOL, TRIG, HDL, CHOLHDL, VLDL, LDLCALC  in the last 540 hours.  CBG: No results for input(s): GLUCAP in the last 168 hours.  Microbiology: Results for orders placed or performed during the hospital encounter of 09/11/14  Clostridium Difficile by PCR (not at [facility])     Status: None   Collection Time: 09/22/14  1:45 PM  Result Value Ref Range Status   Toxigenic C Difficile by pcr NEGATIVE NEGATIVE Final  Clostridium Difficile by PCR (not at [facility])     Status: None   Collection Time: 09/25/14  9:12 AM  Result Value Ref Range Status   Toxigenic C Difficile by pcr NEGATIVE NEGATIVE Final    Coagulation Studies:  Recent Labs  11/10/14 1220  LABPROT 16.3*  INR 1.29    Imaging: Dg Chest Portable 1 View  11/10/2014   CLINICAL DATA:  ETT placement  EXAM: PORTABLE CHEST - 1 VIEW  COMPARISON:  09/16/2014  FINDINGS: Endotracheal tube terminates 3 cm above the carina.  Cardiomegaly with pulmonary vascular congestion. No frank interstitial edema. No pleural effusion or pneumothorax.  Right IJ dual lumen dialysis catheter terminates in the SVC.  IMPRESSION: Endotracheal tube terminates 3 cm above the carina.   Electronically Signed   By: Charline Bills M.D.   On: 11/10/2014 13:03     Felicie Morn PA-C [facility] [phone]  11/10/2014, 1:22 PM   Assessment: 59 y.o. female with hypertension, end-stage renal disease on dialysis, COPD and previous stroke in March 2016, presenting with clinical findings consistent with acute left MCA territory ischemic stroke, as well as acute respiratory failure requiring intubation and mechanical ventilation.  Stroke Risk Factors - hypertension, PFO  Plan: 1. Neuro ICU management per post TPA protocol 2. MRI of the brain without contrast 3. Repeat CT of the head without contrast 24 hours post TPA 4. 2-D echocardiogram 5. Hemoglobin A1c and fasting lipid panel 6. PT, OT and SLP consults 7. Resume antiplatelets therapy if CT scan 24 hours post TPA administration shows no intracranial  hemorrhage (choice of antiplatelets therapy to be determined by stroke team)  This patient is critically ill and at significant risk of neurological worsening or death, and care requires constant monitoring of vital signs, hemodynamics,respiratory and cardiac monitoring, neurological assessment, discussion with family, other specialists and medical decision making of high complexity. Total critical care time was 120 minutes.  I personally participated in this patient's evaluation and management, including clinical examination, as well as formulating the above clinical assessment and management recommendations.  Venetia Maxon M.D. [facility] [phone]

## 2014-11-10 NOTE — Progress Notes (Signed)
RT note-Received patient form ED, on current settings.

## 2014-11-10 NOTE — Telephone Encounter (Signed)
[facility] therapist called to inform us (their policy) that [name] missed a therapy session 11/06/14.  Noted.

## 2014-11-10 NOTE — Progress Notes (Signed)
Patient taken to CT and back to trauma room C without any complications.

## 2014-11-10 NOTE — Progress Notes (Signed)
Hypoglycemic Event  CBG: 63  Treatment: D50 IV 25 mL  Symptoms: unresponsive  Follow-up CBG: Time: 15:30 CBG Result: 85  Possible Reasons for Event: Unknown  Comments/MD notified: MD aware    Holly Bodily  Remember to initiate Hypoglycemia Order Set & complete

## 2014-11-10 NOTE — ED Notes (Signed)
Pt transported to CTA with this RN, Sammuel Cooper and RT. Pt remained on monitor. VSS throughout. Will continue to monitor graft site.

## 2014-11-11 ENCOUNTER — Inpatient Hospital Stay (HOSPITAL_COMMUNITY): Payer: Medicaid Other

## 2014-11-11 DIAGNOSIS — I63312 Cerebral infarction due to thrombosis of left middle cerebral artery: Secondary | ICD-10-CM

## 2014-11-11 DIAGNOSIS — J9601 Acute respiratory failure with hypoxia: Secondary | ICD-10-CM | POA: Diagnosis present

## 2014-11-11 DIAGNOSIS — I63032 Cerebral infarction due to thrombosis of left carotid artery: Secondary | ICD-10-CM

## 2014-11-11 DIAGNOSIS — I639 Cerebral infarction, unspecified: Secondary | ICD-10-CM

## 2014-11-11 LAB — BASIC METABOLIC PANEL
Anion gap: 16 — ABNORMAL HIGH (ref 5–15)
BUN: 27 mg/dL — ABNORMAL HIGH (ref 6–20)
CHLORIDE: 97 mmol/L — AB (ref 101–111)
CO2: 23 mmol/L (ref 22–32)
Calcium: 8.3 mg/dL — ABNORMAL LOW (ref 8.9–10.3)
Creatinine, Ser: 6.05 mg/dL — ABNORMAL HIGH (ref 0.44–1.00)
GFR, EST AFRICAN AMERICAN: 8 mL/min — AB (ref >60)
GFR, EST NON AFRICAN AMERICAN: 7 mL/min — AB (ref >60)
Glucose, Bld: 80 mg/dL (ref 65–99)
Potassium: 3.3 mmol/L — ABNORMAL LOW (ref 3.5–5.1)
SODIUM: 136 mmol/L (ref 135–145)

## 2014-11-11 LAB — GLUCOSE, CAPILLARY
Glucose-Capillary: 86 mg/dL (ref 65–99)
Glucose-Capillary: 97 mg/dL (ref 65–99)
Glucose-Capillary: 113 mg/dL — ABNORMAL HIGH (ref 65–99)
Glucose-Capillary: 95 mg/dL (ref 65–99)
Glucose-Capillary: 118 mg/dL — ABNORMAL HIGH (ref 65–99)

## 2014-11-11 LAB — CBC WITH DIFFERENTIAL/PLATELET
BASOS PCT: 0 % (ref 0–1)
Basophils Absolute: 0 10*3/uL (ref 0.0–0.1)
EOS ABS: 0.2 10*3/uL (ref 0.0–0.7)
Eosinophils Relative: 2 % (ref 0–5)
HEMATOCRIT: 38.9 % (ref 36.0–46.0)
Hemoglobin: 12.6 g/dL (ref 12.0–15.0)
LYMPHS PCT: 7 % — AB (ref 12–46)
Lymphs Abs: 0.6 10*3/uL — ABNORMAL LOW (ref 0.7–4.0)
MCH: 29.1 pg (ref 26.0–34.0)
MCHC: 32.4 g/dL (ref 30.0–36.0)
MCV: 89.8 fL (ref 78.0–100.0)
MONOS PCT: 14 % — AB (ref 3–12)
Monocytes Absolute: 1.2 10*3/uL — ABNORMAL HIGH (ref 0.1–1.0)
Neutro Abs: 6.6 10*3/uL (ref 1.7–7.7)
Neutrophils Relative %: 77 % (ref 43–77)
PLATELETS: 186 10*3/uL (ref 150–400)
RBC: 4.33 MIL/uL (ref 3.87–5.11)
RDW: 20.1 % — ABNORMAL HIGH (ref 11.5–15.5)
WBC: 8.5 10*3/uL (ref 4.0–10.5)

## 2014-11-11 LAB — LIPID PANEL
Cholesterol: 135 mg/dL (ref 0–200)
HDL: 37 mg/dL — ABNORMAL LOW (ref >40)
LDL CALC: 79 mg/dL (ref 0–99)
TRIGLYCERIDES: 95 mg/dL (ref <150)
Total CHOL/HDL Ratio: 3.6 RATIO
VLDL: 19 mg/dL (ref 0–40)

## 2014-11-11 MED ORDER — SODIUM CHLORIDE 0.9 % IV SOLN
1000.0000 mg | Freq: Once | INTRAVENOUS | Status: AC
  Administered 2014-11-11: 1000 mg via INTRAVENOUS
  Filled 2014-11-11: qty 10

## 2014-11-11 MED ORDER — IPRATROPIUM BROMIDE 0.02 % IN SOLN
0.5000 mg | RESPIRATORY_TRACT | Status: DC
  Administered 2014-11-11 – 2014-11-12 (×6): 0.5 mg via RESPIRATORY_TRACT
  Filled 2014-11-11 (×6): qty 2.5

## 2014-11-11 MED ORDER — LEVALBUTEROL HCL 0.63 MG/3ML IN NEBU
0.6300 mg | INHALATION_SOLUTION | RESPIRATORY_TRACT | Status: DC
  Administered 2014-11-11 – 2014-11-12 (×6): 0.63 mg via RESPIRATORY_TRACT
  Filled 2014-11-11 (×11): qty 3

## 2014-11-11 MED ORDER — POTASSIUM CHLORIDE 10 MEQ/100ML IV SOLN
10.0000 meq | INTRAVENOUS | Status: AC
  Administered 2014-11-11 (×3): 10 meq via INTRAVENOUS
  Filled 2014-11-11 (×3): qty 100

## 2014-11-11 MED ORDER — IOHEXOL 300 MG/ML  SOLN
50.0000 mL | Freq: Once | INTRAMUSCULAR | Status: DC | PRN
  Administered 2014-11-11: 20 mL via ORAL
  Filled 2014-11-11: qty 50

## 2014-11-11 MED ORDER — VITAL HIGH PROTEIN PO LIQD
1000.0000 mL | ORAL | Status: DC
  Administered 2014-11-11 – 2014-11-23 (×12): 1000 mL
  Filled 2014-11-11 (×18): qty 1000

## 2014-11-11 MED ORDER — ASPIRIN 300 MG RE SUPP
300.0000 mg | Freq: Every day | RECTAL | Status: DC
  Administered 2014-11-11 – 2014-11-15 (×5): 300 mg via RECTAL
  Filled 2014-11-11 (×7): qty 1

## 2014-11-11 MED ORDER — PRO-STAT SUGAR FREE PO LIQD
30.0000 mL | Freq: Every day | ORAL | Status: DC
  Administered 2014-11-11 – 2014-11-23 (×56): 30 mL
  Filled 2014-11-11 (×60): qty 30

## 2014-11-11 MED ORDER — LEVETIRACETAM 100 MG/ML PO SOLN
500.0000 mg | Freq: Two times a day (BID) | ORAL | Status: DC
  Administered 2014-11-11 – 2014-11-25 (×29): 500 mg
  Filled 2014-11-11 (×40): qty 5

## 2014-11-11 MED ORDER — POTASSIUM CHLORIDE 10 MEQ/50ML IV SOLN
10.0000 meq | INTRAVENOUS | Status: DC
Start: 2014-11-11 — End: 2014-11-11

## 2014-11-11 MED ORDER — LEVETIRACETAM 500 MG PO TABS
500.0000 mg | ORAL_TABLET | Freq: Two times a day (BID) | ORAL | Status: DC
  Filled 2014-11-11 (×2): qty 1

## 2014-11-11 NOTE — Progress Notes (Signed)
eLink Physician-Brief Progress Note Patient Name: [name] DOB: [birth year] MRN: [id number]   Date of Service  11/11/2014  HPI/Events of Note    eICU Interventions  K replaced     Intervention Category Intermediate Interventions: Electrolyte abnormality - evaluation and management  BYRUM,ROBERT S. 11/11/2014, 5:31 AM

## 2014-11-11 NOTE — Consult Note (Signed)
Renal Service Consult Note [facility]  [name] 11/11/2014 Maree Krabbe Requesting Physician:  Dr Pearlean Brownie  Reason for Consult:  ESRD pt with acute stroke HPI: The patient is a 59 y.o. year-old with hx of HTN, gout, asthma, RV failure/ PFO/ COPD. 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 Complicated by new ESRD (was CKD4), ruptured duod ulcer c/b enterocutaneous fistula , PSVT, obesity, Cdif and HTN. She recovered and went to CIR for inpt rehab. Dc'd on 6/30.  Yesterday was at OP HD, finished all but a few minutes of dialysis and became unresponsive. Taken to ED by EMS where intubated. CT head negative except old infarcts. Today is still unresponsive. She did receive TPA for acute CVA w dense R hemiparesis.   Past Medical History  Past Medical History  Diagnosis Date  . Asthma   . Hypertension   . Gout   . Arthritis   . Insomnia   . Chronic kidney disease   . Depression   . Renal insufficiency   . Aortic aneurysm without rupture     3cm by CT 08/03/14  . Paroxysmal SVT (supraventricular tachycardia)   . Cor pulmonale   . Stroke 06/2014  . PFO (patent foramen ovale)   . COPD (chronic obstructive pulmonary disease)    Past Surgical History  Past Surgical History  Procedure Laterality Date  . Multiple tooth extractions    . Av fistula placement Left 01/28/2014    Procedure: ARTERIOVENOUS (AV) FISTULA CREATION;  Surgeon: Sherren Kerns, MD;  Location: [facility] OR;  Service: Vascular;  Laterality: Left;  . Tee without cardioversion N/A 06/23/2014    Procedure: TRANSESOPHAGEAL ECHOCARDIOGRAM (TEE);  Surgeon: Lewayne Bunting, MD;  Location: [facility] ENDOSCOPY;  Service: Cardiovascular;  Laterality: N/A;  . Insertion of dialysis catheter Right 06/27/2014    Procedure: INSERTION OF Right Internal Jugular DIATEK CATHETER;  Surgeon: Pryor Ochoa, MD;  Location: [facility] OR;  Service: Vascular;  Laterality: Right;  .  Laparotomy N/A 07/01/2014    Procedure: EXPLORATORY LAPAROTOMY WITH CLOSURE OF PERFORATED PYLORIC ULCER;  Surgeon: Claud Kelp, MD;  Location: [facility] OR;  Service: General;  Laterality: N/A;  . Bowel resection N/A 07/01/2014    Procedure: SMALL BOWEL RESECTION;  Surgeon: Claud Kelp, MD;  Location: [facility] OR;  Service: General;  Laterality: N/A;  . Revison of arteriovenous fistula Left 09/09/2014    Procedure: LIGATION OF COMPETING BRANCH, & RESECTION OF VENOUS ANEURYSM OF LEFT UPPER ARM ARTERIOVENOUS FISTULA;  Surgeon: Larina Earthly, MD;  Location: [facility] OR;  Service: Vascular;  Laterality: Left;   Family History  Family History  Problem Relation Age of Onset  . Diabetes Mother   . Hypertension Mother   . Heart disease Mother   . Heart attack Mother   . Peripheral vascular disease Mother   . Diabetes Father   . Heart disease Father   . Hypertension Father   . Diabetes Sister   . Hypertension Sister   . Heart disease Sister     before age 46  . Heart attack Sister   . Peripheral vascular disease Sister   . Heart disease Brother   . Hyperlipidemia Daughter    Social History  reports that she has been smoking Cigarettes.  She has a 10 pack-year smoking history. She has never used smokeless tobacco. She reports that she drinks alcohol. She reports that she does not use illicit drugs. Allergies  Allergies  Allergen Reactions  . Heparin Other (See Comments)    Heparin antibody positive; SRA negative   Home medications Prior to Admission medications   Medication Sig Start Date End Date Taking? Authorizing Provider  ALPRAZolam Prudy Feeler) 0.5 MG tablet Take 1 tablet (0.5 mg total) by mouth Every Tuesday,Thursday,and Saturday with dialysis. 10/02/14  Yes Daniel J Angiulli, PA-C  amiodarone (PACERONE) 200 MG tablet 2 tablets daily until 10/05/2014 then 1 tablet daily Patient taking differently: Take 200 mg by mouth daily.  10/02/14  Yes Daniel J Angiulli, PA-C  aspirin 81 MG chewable tablet Chew 1  tablet (81 mg total) by mouth daily. 09/08/14  Yes Zannie Cove, MD  calcitRIOL (ROCALTROL) 0.5 MCG capsule Take 1 capsule (0.5 mcg total) by mouth every Tuesday, Thursday, and Saturday at 6 PM. 09/11/14  Yes Albertine Grates, MD  citalopram (CELEXA) 20 MG tablet Take 1 tablet (20 mg total) by mouth daily. 10/02/14  Yes Daniel J Angiulli, PA-C  levothyroxine (SYNTHROID, LEVOTHROID) 25 MCG tablet Take 1 tablet (25 mcg total) by mouth daily before breakfast. Patient taking differently: Take 25 mcg by mouth every other day.  10/02/14  Yes Daniel J Angiulli, PA-C  midodrine (PROAMATINE) 10 MG tablet Take 1 tablet (10 mg total) by mouth 2 (two) times daily with a meal. Patient taking differently: Take 10 mg by mouth daily.  10/02/14  Yes Daniel J Angiulli, PA-C  montelukast (SINGULAIR) 10 MG tablet Take 1 tablet (10 mg total) by mouth at bedtime. 09/11/14  Yes Albertine Grates, MD  multivitamin (RENA-VIT) TABS tablet Take 1 tablet by mouth at bedtime. 10/02/14  Yes Daniel J Angiulli, PA-C  Nutritional Supplements (FEEDING SUPPLEMENT, JEVITY 1.2 CAL,) LIQD Place 1,000 mLs into feeding tube continuous. 10/02/14  Yes Daniel J Angiulli, PA-C  oxyCODONE (ROXICODONE) 5 MG immediate release tablet Take 1 tablet (5 mg total) by mouth every 6 (six) hours as needed for severe pain. 10/02/14  Yes Daniel J Angiulli, PA-C  sevelamer carbonate (RENVELA) 800 MG tablet Take 800 mg by mouth 3 (three) times daily with meals.   Yes Historical Provider, MD  vitamin C (VITAMIN C) 250 MG tablet Take 1 tablet (250 mg total) by mouth daily. 10/02/14  Yes Daniel J Angiulli, PA-C  budesonide (PULMICORT) 0.25 MG/2ML nebulizer solution Take 2 mLs (0.25 mg total) by nebulization 2 (two) times daily. Patient not taking: Reported on 11/11/2014 09/08/14   Zannie Cove, MD  collagenase (SANTYL) ointment Apply topically daily. As directed 10/06/14   Mcarthur Rossetti Angiulli, PA-C  ipratropium (ATROVENT) 0.02 % nebulizer solution Take 2.5 mLs (0.5 mg total) by nebulization 3 (three)  times daily. Patient not taking: Reported on 11/11/2014 09/11/14   Albertine Grates, MD  levalbuterol Pauline Aus) 0.63 MG/3ML nebulizer solution Take 3 mLs (0.63 mg total) by nebulization 3 (three) times daily. Patient not taking: Reported on 11/11/2014 09/11/14   Albertine Grates, MD   Liver Function Tests  Recent Labs Lab 11/10/14 1220  AST 23  ALT 16  ALKPHOS 74  BILITOT 1.3*  PROT 7.5  ALBUMIN 3.0*   No results for input(s): LIPASE, AMYLASE in the last 168 hours. CBC  Recent Labs Lab 11/10/14 1220 11/10/14 1225 11/11/14 0330  WBC 6.8  --  8.5  NEUTROABS 4.6  --  6.6  HGB 12.0 15.3* 12.6  HCT 36.9 45.0 38.9  MCV 90.2  --  89.8  PLT 202  --  186   Basic Metabolic Panel  Recent Labs Lab 11/10/14 1220 11/10/14 1225  11/11/14 0330  NA 135 134* 136  K 3.4* 3.3* 3.3*  CL 97* 97* 97*  CO2 23  --  23  GLUCOSE 84 81 80  BUN 25* 28* 27*  CREATININE 5.66* 5.30* 6.05*  CALCIUM 8.0*  --  8.3*    Filed Vitals:   11/11/14 1100 11/11/14 1147 11/11/14 1200 11/11/14 1221  BP: 104/66 132/77 108/67   Pulse: 88 93 88   Temp: 100 F (37.8 C)  100 F (37.8 C)   TempSrc:      Resp: Height:    5' 7.01" (1.702 m)  Weight:      SpO2: 99% 100% 99%    Exam Unresponsive, not on any sedation, on the vent No rash, cyanosis or gangrene Sclera anicteric, throat w ETT  NO jvd Chest clear bilat no rales or wheezes RRR no MRG ABd obese, soft ntnd LUQ PEG tube in place, dec'd BS GU deferred Ext no sig LE or UE edema Neuro is unreponsive at this time, eyes closed, not responding to commands, not moving any extremities   TTS [place]  4h   2/2 [place]  91kg  Heparin none Hect 2 ug Aranesp 200 ug/wk + venofer 50/wk  Assessment: 1. Acute CVA / R hemiparesis, s/p TPA 2. Recent CVA w left hemiparesis - left side weakness had mostly resolved per family 3. ESRD on HD TTS 4. Hx ruptured ulcer / PEG tube - was to have PEG removed, was eating well 5. HD access - using AVG, was to have TDC  removed 6. Volume no excess, 1kg up 7. Chronic hypotension on midodrine 10 bid 8. Afib on amio, was not on coumadin due to comorbidities and risk of complications   Plan- HD tomorrow. MRI due today.   [name] MD (pgr) [phone]    [phone] 11/11/2014, 12:43 PM

## 2014-11-11 NOTE — Progress Notes (Signed)
PT Cancellation Note  Patient Details Name: [name] MRN: [id number] DOB: July 13, 1955   Cancelled Treatment:    Reason Eval/Treat Not Completed: Patient not medically ready; patient on bedrest and intubated.  Will attempt to see tomorrow.   Rakeisha Nyce,CYNDI 11/11/2014, 12:18 PM  Sheran Lawless, PT [phone] 11/11/2014

## 2014-11-11 NOTE — Procedures (Addendum)
EEG report.  Brief clinical history: 59 y.o. female with hypertension, end-stage renal disease on dialysis, COPD and previous stroke in March 2016, presenting with clinical findings consistent with acute left MCA territory ischemic stroke, as well as acute respiratory failure requiring intubation and mechanical ventilation. Subsequent development of some twitching of the right hand concerning for a focal motor seizure.    Technique: this is a 17 channel routine scalp EEG performed at the bedside with bipolar and monopolar montages arranged in accordance to the international 10/20 system of electrode placement. One channel was dedicated to EKG recording.  No activating procedures performed.  Description: as the study opens and throughout the entire recording, there is generalized, continuous, low voltage, reactive theta slowing that doesn't follow an ictal pattern at any time. No focal or generalized epileptiform discharges noted.  EKG showed sinus rhythm.  Impression: this is an abnormal EEG that demonstrates findings consistent with mild global cerebral dysfunction as seen in a wide variety of toxic metabolic encephalopathies and degenerative brain disorders. No electrographic seizures noted. Clinical correlation is advised.   Wyatt Portela, MD

## 2014-11-11 NOTE — Progress Notes (Signed)
EEG completed; results pending.    

## 2014-11-11 NOTE — Progress Notes (Signed)
Initial Nutrition Assessment  DOCUMENTATION CODES:   Obesity unspecified  INTERVENTION:   Continue Vital High Protein @ 30 ml/hr via GJ tube (j-port)   30 ml Prostat five times per day.    Tube feeding regimen provides 1220 kcal, 138 grams of protein, and 601 ml of H2O.    NUTRITION DIAGNOSIS:   Inadequate oral intake related to inability to eat as evidenced by NPO status.   GOAL:   Provide needs based on ASPEN/SCCM guidelines   MONITOR:   TF tolerance, Vent status, Labs, I & O's  REASON FOR ASSESSMENT:   Consult, Malnutrition Screening Tool, Ventilator Enteral/tube feeding initiation and management  ASSESSMENT:   This is a 59 y/o female who had an acute left MCA stroke today and received IV TPA. She was intubated for airway protection in the ER. She has a long history of ESRD, a recent R ACA stroke, and a recent history of ischemic bowel requiring resection.  Patient is currently intubated on ventilator support MV: 12.3 L/min Temp (24hrs), Avg:99.1 F (37.3 C), Min:98.2 F (36.8 C), Max:100.2 F (37.9 C)  Labs reviewed: potassium low PEG/J placed 5/16 Pt discussed during ICU rounds and with RN.  Pt with recent hospitalization 3/16-6/16. Pt with bowel surgery on TPN, had a G/J placed. She has been living at home with her daughter not using her tube for anything and has not been flushing the tube, when pt was admitted tube was clogged. RN followed protocol to open tube.  Pt has been eating well at home. Daughter cooks and she tries to limit certain foods, cheese, etc. Pt had appointment to have GJ removed 8/9.   Diet Order:  Diet NPO time specified  Skin:  Reviewed, no issues  Last BM:  unknown  Height:   Ht Readings from Last 1 Encounters:  11/11/14 5' 7.01" (1.702 m)    Weight:   Wt Readings from Last 1 Encounters:  11/11/14 203 lb 11.3 oz (92.4 kg)  Dry weight: 91 kg  Ideal Body Weight:  67.2 kg  BMI:  Body mass index is 31.9  kg/(m^2).  Estimated Nutritional Needs:   Kcal:  1610-9604  Protein:  > 134 grams  Fluid:  >/= 1.2 L/day  EDUCATION NEEDS:   No education needs identified at this time  Kendell Bane RD, LDN, CNSC [phone] Pager [phone] After Hours Pager

## 2014-11-11 NOTE — Progress Notes (Signed)
STROKE TEAM PROGRESS NOTE   HISTORY [name] is an 59 y.o. female who was at dialysis and finished her session. After dialysis while still at the center she was noted to have sudden onset right sided weakness, left gaze deviation (LKW 11/10/2014 at 1106). EMS was called and on site BG was noted to be 50. Patient was given D50 and was transported to [facility] ED. On arrival she had labored respirations was not protecting her airway, and required intubation. CT of head was delayed due to need for intubation. CT head obtained and showed no acte stroke or bleed. Patient was deemed to be a candidate for TPA and was brought back to the ED. Dialysis needles were still present in the patient's AV fistula. IV team was assaulted and removed the hemodialysis needles as well as achieved hemostasis. IV tPA was then administered. CT angiogram of the head and neck was obtained which showed no large vessel proximal occlusion. She was subsequently admitted to the neuro intensive care unit for further management.    SUBJECTIVE (INTERVAL HISTORY) Her RT is at the bedside. She is breathing over the vent without difficulty. No family present.    OBJECTIVE Temp:  [98.2 F (36.8 C)-100.2 F (37.9 C)] 99.9 F (37.7 C) (08/10 0800) Pulse Rate:  [80-107] 87 (08/10 0800) Cardiac Rhythm:  [-] Normal sinus rhythm (08/10 0800) Resp:  [11-35] 23 (08/10 0800) BP: (104-189)/(62-126) 104/67 mmHg (08/10 0800) SpO2:  [89 %-100 %] 98 % (08/10 0800) FiO2 (%):  [40 %-100 %] 40 % (08/10 0800) Weight:  [92.4 kg (203 lb 11.3 oz)-95 kg (209 lb 7 oz)] 92.4 kg (203 lb 11.3 oz) (08/10 0420)   Recent Labs Lab 11/10/14 1950 11/10/14 2202 11/10/14 2345 11/11/14 0424 11/11/14 0826  GLUCAP 67 78 74 86 97    Recent Labs Lab 11/10/14 1220 11/10/14 1225 11/11/14 0330  NA 135 134* 136  K 3.4* 3.3* 3.3*  CL 97* 97* 97*  CO2 23  --  23  GLUCOSE 84 81 80  BUN 25* 28* 27*  CREATININE 5.66* 5.30* 6.05*  CALCIUM 8.0*  --  8.3*     Recent Labs Lab 11/10/14 1220  AST 23  ALT 16  ALKPHOS 74  BILITOT 1.3*  PROT 7.5  ALBUMIN 3.0*    Recent Labs Lab 11/10/14 1220 11/10/14 1225 11/11/14 0330  WBC 6.8  --  8.5  NEUTROABS 4.6  --  6.6  HGB 12.0 15.3* 12.6  HCT 36.9 45.0 38.9  MCV 90.2  --  89.8  PLT 202  --  186   No results for input(s): CKTOTAL, CKMB, CKMBINDEX, TROPONINI in the last 168 hours.  Recent Labs  11/10/14 1220  LABPROT 16.3*  INR 1.29    Recent Labs  11/10/14 1336  COLORURINE YELLOW  LABSPEC 1.013  PHURINE 5.0  GLUCOSEU NEGATIVE  HGBUR NEGATIVE  BILIRUBINUR SMALL*  KETONESUR 15*  PROTEINUR 100*  UROBILINOGEN 1.0  NITRITE NEGATIVE  LEUKOCYTESUR NEGATIVE       Component Value Date/Time   CHOL 135 11/11/2014 0330   TRIG 95 11/11/2014 0330   HDL 37* 11/11/2014 0330   CHOLHDL 3.6 11/11/2014 0330   VLDL 19 11/11/2014 0330   LDLCALC 79 11/11/2014 0330   Lab Results  Component Value Date   HGBA1C 6.5* 06/20/2014      Component Value Date/Time   LABOPIA NONE DETECTED 11/10/2014 1336   COCAINSCRNUR NONE DETECTED 11/10/2014 1336   LABBENZ POSITIVE* 11/10/2014 1336   AMPHETMU NONE  DETECTED 11/10/2014 1336   THCU POSITIVE* 11/10/2014 1336   LABBARB NONE DETECTED 11/10/2014 1336     Recent Labs Lab 11/10/14 1220  ETH <5     IMAGING  Ct Head Wo Contrast 11/10/2014    No evidence for post t-PA hemorrhage. Early cytotoxic edema now noted in the LEFT hemisphere.    11/10/2014   1. No acute intracranial pathology.     Ct Angio Head & Neck W/cm &/or Wo/cm 11/10/2014   1. 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 2. No proximal large vessel occlusion. 3. Moderate irregular narrowing and attenuation of ACA branch vessels, progressed from prior MRA. 4. Mild-to-moderate plaque in the cervical carotid arteries without stenosis. 5. Mild proximal left vertebral artery stenosis.    Portable Chest Xray 11/11/2014    1. Lines and tubes in stable position. 2. New onset  of dense left lower lobe atelectasis and consolidation. Small left pleural effusion.    11/10/2014   Endotracheal tube terminates 3 cm above the carina.     Dg Abd Portable 1v 11/11/2014   1. G-tube seen ending overlying the body of the stomach, with contrast filling the fundus of the stomach as expected. 2. Dense left basilar airspace opacification raises concern for pneumonia.      PHYSICAL EXAM elderly African-American lady who is intubated. . Afebrile. Head is nontraumatic. Neck is supple without bruit.    Cardiac exam no murmur or gallop. Lungs are clear to auscultation. Distal pulses are well felt. Neurological Exam :  Drowsy but opens eyes partially to stimuli. Left gaze preference. Unable to look to the right past midline even with doll's eye movements. Pupils irregular but reactive 3 mm. Fundi could not be visualized. Vision acuity cannot be tested. Blinks to threat minimally on the right and partially on the left. Right lower facial weakness. Tongue midline. Not following commands. Aphasic. Spontaneous left upper and lower extremity moments mild right-sided weakness but able to withdraw to pain. Deep tendon reflexes are depressed. Plantars are both equivocal. Sensation and coordination cannot be reliably tested. Gait was not tested. ASSESSMENT/PLAN [name] is a 59 y.o. female with history of multiple medical problems including ESRD, recent stroke and recent bowel ischemia leading to resection presenting with right sided weakness, left gaze deviation. She received IV t-PA 11/10/2014 at 1340.   Stroke:  Dominant left MCA infarct embolic secondary to known atrial fibrillation   Resultant  VDRF, right hemiparesis  MRI  pending   CTA head & neck L M2 branch occlusion. ACA atherosclerosis progressed since March. Plaque in bilateral ICAs and L VA. No proximal large vessel occlusion  2D Echo  pending   TEE 06/23/2014 PFO. Severe RV dysfunction, RAE, TR. Incessant atrial  tachycardia  Given known PFO, check LE venous dopplers  LDL 79  HgbA1c 6.5 in March  SCDs for VTE prophylaxis (listed heparin antibody positive)  Diet NPO time specified  aspirin 81 mg orally every day prior to admission, now on no antithrombotic as within 24h of IV TPA. Recommend resumption of aspirin if 24h imaging negative for hemorrhage  Ongoing aggressive stroke risk factor management  Continue ICU level care  Therapy recommendations:  pending   Disposition:  pending   Paroxysmal Atrial Fibrillation  Documented on tele 07/27/2014  Decided not an anticoagulation candidate at that time as risk outweighs benefits given multiple medical issues (see note 07/28/2014)  Respiratory Failure  Intubated in ED  Recommend addressing code status prior to extubation  Possible seizure  Intermittent, arrhythmic twitching of R hand  Started on Keppra, 1 gm load followed by 500 mg bid  EEG pending   AcceleratedHypertension Pulmonary hypertension   BP 189/118 on arrival  Improved over night  Not on BP meds as NPO  Hyperlipidemia  Home meds:  No statin  LDL 79, goal < 70  Add statin once able to swallow  Diabetes  Hypoglycemia over night w/ dextrose given  HgbA1c 6.5 in March, at goal < 7.0  Controlled  Other Stroke Risk Factors  Cigarette smoker, advised to stop smoking  ETOH use  UDS positive for benzos and THC  Obesity, Body mass index is 31.9 kg/(m^2).   Hx stroke/TIA - Right hemisphere acute infarcts involving right ACA, left PCA, and left MCA, cardioembolic pattern, pt w/ PAF during prolonged hospitalization 06/2014 at that time "Given her multitude of co morbidities (including fistula) she would not be an optimal candidate for anticoagulation. Risks outweigh benefits. Family understands risk of future stroke/ thrombus"  Known PFO  Other Active Problems  ESRD on HD T, TH, S  AAA 3 cm by CT 08/2014  Other Pertinent History  Recent admission  for bowel ischemia and resection Ketrina 2016; palliative care heavily involved  Hospital day # 1  Rhoderick Moody [facility] See Amion for Pager information 11/11/2014 9:18 AM  I have personally examined this patient, reviewed notes, independently viewed imaging studies, participated in medical decision making and plan of care. I have made any additions or clarifications directly to the above note. Agree with note above.  She has presented with aphasia and right-sided weakness due to to suspected embolic left MCA infarct. She has received IV tPA.Marland KitchenShe  remains at risk for neurological worsening,brain hemorrhage, recurrent stroke/TIAs and needs ongoing stroke evaluation and aggressive risk factor modification. Recommend strict control of blood pressure and close neurological monitoring. Patient is getting an EEG as she had some episode of tremulousness yesterday. No family is at the bedside. Recommend discussion with family about goals of care and DO NOT RESUSCITATE status prior to extubation. This patient is critically ill and at significant risk of neurological worsening, death and care requires constant monitoring of vital signs, hemodynamics,respiratory and cardiac monitoring, extensive review of multiple databases, frequent neurological assessment, discussion with family, other specialists and medical decision making of high complexity.I have made any additions or clarifications directly to the above note.This critical care time does not reflect procedure time, or teaching time or supervisory time of PA/NP/Med Resident etc but could involve care discussion time.  I spent 35 minutes of neurocritical care time  in the care of  this patient.  Delia Heady, MD Medical Director [facility] St[facility] Pager: [phone] 11/11/2014 11:07 AM   To contact Stroke Continuity provider, please refer to [email or web address]. After hours, contact General Neurology

## 2014-11-11 NOTE — Progress Notes (Signed)
Called regarding patient having some twitching of the right hand. On exam, patient appears to have arrhythmic but persistent twitching of the right hand with preserved consciousness s she is still able to follow commands to wiggle toes.   It is not clear that this is ictal, but I think that starting keppra could be prudent. Also will get an EEG in the AM.   1) Keppra 1gm x 1, then  BID 2) EEG  Ritta Slot, MD [facility] [phone]  If 7pm- 7am, please page neurology on call as listed in AMION.

## 2014-11-11 NOTE — Progress Notes (Signed)
SLP Cancellation Note  Patient Details Name: Carita C Pastrana MRN: [id number] DOB: Jun 01, 1955   Cancelled treatment:       Reason Eval/Treat Not Completed: Medical issues which prohibited therapy- pt remains intubated.  Will follow along.   Blenda Mounts Laurice 11/11/2014, 12:22 PM

## 2014-11-11 NOTE — Progress Notes (Signed)
RT Note: Patient was transported to and from CT with no issues. She was placed on her rest mode for the transport and tolerated it well. Patient was placed back on her wean and continues to tolerate it well. Rt will continue to monitor.

## 2014-11-11 NOTE — Progress Notes (Signed)
[facility]  Patient Status: Active (receiving services up to time of hospitalization)  [facility] is providing the following services: RN, PT and OT  If patient discharges after hours, please call [phone].   [name] 11/11/2014, 9:48 AM

## 2014-11-11 NOTE — Consult Note (Signed)
PULMONARY / CRITICAL CARE MEDICINE   Name: [name] MRN: [id number] DOB: June 14, 1955    ADMISSION DATE:  11/10/2014 CONSULTATION DATE:  11/10/2014  REFERRING MD :  Pearlean Brownie  CHIEF COMPLAINT:  Stroke, post TPA  INITIAL PRESENTATION:  This is a 59 y/o female who had an acute left MCA stroke today and received IV TPA.  She was intubated for airway protection in the ER.  She has a long history of ESRD, a recent R ACA stroke, and a recent history of ischemic bowel requiring resection.  Apparently today at dialysis she developed sudden onset of R sided weakness.  She was brought to the ER for further evaluation.  No further history could be obtained due to intubation.  Of note, after TPA she had bleeding from her AV fistula   STUDIES:  8/9 CT head > no acute abnormality 8/9 CT angiogram post tpa> L MCA m2 superior division branch vessel occlusion, moderate irregular narrowing and attenuation of ACA branch vessels, progressed from prior MRA 8/9 admission, IV TPA for L MCA stroke   SUBJECTIVE/OVERNIGHT/INTERVAL HX 11/11/14: CT head pending. Neuro Dr Pearlean Brownie discussing goals with dtr. Dtr denied any questions to me. On prn sedation - receieved none - followed some command with delayed response but somnolent  VITAL SIGNS: Temp:  [98.2 F (36.8 C)-100.2 F (37.9 C)] 99.9 F (37.7 C) (08/10 0900) Pulse Rate:  [80-107] 94 (08/10 0900) Resp:  [11-35] 25 (08/10 0900) BP: (104-189)/(62-126) 124/77 mmHg (08/10 0900) SpO2:  [89 %-100 %] 96 % (08/10 0900) FiO2 (%):  [40 %-100 %] 40 % (08/10 0856) Weight:  [92.4 kg (203 lb 11.3 oz)-95 kg (209 lb 7 oz)] 92.4 kg (203 lb 11.3 oz) (08/10 0420) HEMODYNAMICS:   VENTILATOR SETTINGS: Vent Mode:  [-] CPAP;PSV FiO2 (%):  [40 %-100 %] 40 % Set Rate:  [14 bmp-18 bmp] 14 bmp Vt Set:  [570 mL] 570 mL PEEP:  [5 cmH20] 5 cmH20 Pressure Support:  [14 cmH20] 14 cmH20 Plateau Pressure:  [18 cmH20-30 cmH20] 18 cmH20 INTAKE / OUTPUT:  Intake/Output Summary (Last  24 hours) at 11/11/14 1125 Last data filed at 11/11/14 0800  Gross per 24 hour  Intake 2140.83 ml  Output     63 ml  Net 2077.83 ml    PHYSICAL EXAMINATION: General:  Chronically ill appearing, on vent Neuro:  Has weak cough to tracheal suctioning, somnlent HEENT:  NCAT, ETT in place Cardiovascular:  RRR, systolic murmur noted, left upper arm AV fistula > dressed Lungs:  Vent supported breaths, clear to auscultation except on right side occ wheeze + Abdomen:  BS+, soft, nontender Musculoskeletal:  Diminished tone on my exam Skin:  R IJ perm cath> clean, PEG > clean  LABS: PULMONARY  Recent Labs Lab 11/10/14 1225 11/10/14 1325  PHART  --  7.331*  PCO2ART  --  52.3*  PO2ART  --  409.0*  HCO3  --  27.6*  TCO2 23 29  O2SAT  --  100.0    CBC  Recent Labs Lab 11/10/14 1220 11/10/14 1225 11/11/14 0330  HGB 12.0 15.3* 12.6  HCT 36.9 45.0 38.9  WBC 6.8  --  8.5  PLT 202  --  186    COAGULATION  Recent Labs Lab 11/10/14 1220  INR 1.29    CARDIAC  No results for input(s): TROPONINI in the last 168 hours. No results for input(s): PROBNP in the last 168 hours.   CHEMISTRY  Recent Labs Lab 11/10/14 1220 11/10/14 1225 11/11/14  0330  NA 135 134* 136  K 3.4* 3.3* 3.3*  CL 97* 97* 97*  CO2 23  --  23  GLUCOSE 84 81 80  BUN 25* 28* 27*  CREATININE 5.66* 5.30* 6.05*  CALCIUM 8.0*  --  8.3*   Estimated Creatinine Clearance: 11.8 mL/min (by C-G formula based on Cr of 6.05).   LIVER  Recent Labs Lab 11/10/14 1220  AST 23  ALT 16  ALKPHOS 74  BILITOT 1.3*  PROT 7.5  ALBUMIN 3.0*  INR 1.29     INFECTIOUS No results for input(s): LATICACIDVEN, PROCALCITON in the last 168 hours.   ENDOCRINE CBG (last 3)   Recent Labs  11/10/14 2345 11/11/14 0424 11/11/14 0826  GLUCAP 74 86 97         IMAGING x48h   Ct Angio Head W/cm &/or Wo Cm  11/10/2014   CLINICAL DATA:  Sudden onset right-sided weakness and left gaze deviation after dialysis  today. Code stroke head CT earlier today. TPA administered afterward.  EXAM: CT ANGIOGRAPHY HEAD AND NECK  TECHNIQUE: Multidetector CT imaging of the head and neck was performed using the standard protocol during bolus administration of intravenous contrast. Multiplanar CT image reconstructions and MIPs were obtained to evaluate the vascular anatomy. Carotid stenosis measurements (when applicable) are obtained utilizing NASCET criteria, using the distal internal carotid diameter as the denominator.  CONTRAST:  50mL OMNIPAQUE IOHEXOL 350 MG/ML SOLN  COMPARISON:  Head CT 11/10/2014 and MRI/MRA 06/20/2014  FINDINGS: CTA NECK  Aortic arch: 3 vessel aortic arch with mild atherosclerotic calcification. Brachiocephalic and subclavian arteries are patent without stenosis.  Right carotid system: Patent with moderate calcified plaque involving the carotid bifurcation and proximal ICA without stenosis. Mid cervical ICA is mildly tortuous.  Left carotid system: Patent with mild-to-moderate calcified plaque at the carotid bifurcation without stenosis. Proximal ICA is tortuous.  Vertebral arteries: Patent and balanced in size. There is mild stenosis of the left vertebral artery at its origin. Scattered calcified plaque is present throughout the course of both vertebral arteries without evidence of significant additional stenosis.  Skeleton: No acute osseous abnormality.  Other neck: Visualized lung apices demonstrate centrilobular emphysema as well as patchy subpleural opacities in the posterior right upper lobe and superior segment of the right lower lobe which may represent atelectasis. Right jugular central venous catheter terminates in the SVC. An endotracheal tube is present. Fluid/secretions are present in the nasal cavity, nasopharynx and oropharynx. Polypoid mucosal thickening/mucous retention cyst is noted in the left sphenoid sinus.  CTA HEAD  Anterior circulation: Internal carotid arteries are patent from skullbase to  carotid termini. There is carotid siphon calcification bilaterally with at most mild cavernous carotid stenosis bilaterally. Right MCA is unremarkable. Left M1 segment is patent without stenosis. Left MCA bifurcation is patent, however there is occlusion of an M2 superior division branch vessel approximately 2 cm distal to the MCA bifurcation. There is likely developing cytotoxic edema distal to this occlusion involving the left frontal operculum and more superior, posterior left frontal lobe. ACAs are patent without proximal stenosis, although there is moderate irregularity and attenuation of more distal A2 and A3 segments bilaterally, increased from prior MRA. No intracranial aneurysm is identified.  Posterior circulation: Intracranial vertebral arteries are patent to the basilar. There is mild right vertebral artery calcified plaque without stenosis. PICA origins are patent. Basilar artery is patent and mildly dolichoectatic without stenosis. Right AICA appears patent at its origin but with moderate to severe proximal tandem stenoses.  SCA origins are patent. Posterior communicating arteries are not identified. PCAs are patent with mild branch vessel irregularity but no proximal stenosis.  Venous sinuses: Superior sagittal sinus is grossly patent. Transverse and sigmoid sinuses are not well evaluated due to arterial phase contrast timing.  Anatomic variants: None.  Delayed phase: Not performed at direction of stroke neurologist.  IMPRESSION: 1. 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 2. No proximal large vessel occlusion. 3. Moderate irregular narrowing and attenuation of ACA branch vessels, progressed from prior MRA. 4. Mild-to-moderate plaque in the cervical carotid arteries without stenosis. 5. Mild proximal left vertebral artery stenosis.  These results were called by telephone at the time of interpretation on 11/10/2014 at 3:03 pm to [facility], [facility] , who verbally acknowledged these results.    Electronically Signed   By: Sebastian Ache   On: 11/10/2014 15:13   Ct Head Wo Contrast  11/10/2014   CLINICAL DATA:  Status post tPA for LEFT MCA stroke. RIGHT hemiparesis. Chronic renal failure.  EXAM: CT HEAD WITHOUT CONTRAST  TECHNIQUE: Contiguous axial images were obtained from the base of the skull through the vertex without intravenous contrast.  COMPARISON:  Code stroke CT head earlier today. CT angiography head and neck earlier today.  FINDINGS: Residual blood pool from recent CTA.  Early cytotoxic edema is now observed in the LEFT temporal operculum, LEFT frontal operculum, and LEFT insula. Previously noted thrombus or embolus in the LEFT M2 MCA (image 13 series 201 of the prior noncontrast CT head) is less prominent. No post t-PA hemorrhage.  Unchanged mild atrophy with chronic RIGHT ACA infarct. No midline shift.  IMPRESSION: No evidence for post t-PA hemorrhage. Early cytotoxic edema now noted in the LEFT hemisphere.   Electronically Signed   By: Elsie Stain M.D.   On: 11/10/2014 17:47   Ct Head Wo Contrast  11/10/2014   CLINICAL DATA:  Code stroke.  Right-sided weakness.  EXAM: CT HEAD WITHOUT CONTRAST  TECHNIQUE: Contiguous axial images were obtained from the base of the skull through the vertex without intravenous contrast.  COMPARISON:  06/19/2014, MR brain 06/20/2014  FINDINGS: There is no evidence of mass effect, midline shift or extra-axial fluid collections. There is no evidence of a space-occupying lesion or intracranial hemorrhage. There is no evidence of a cortical-based area of acute infarction. There is an old right ACA territory infarct with encephalomalacia. There is a small left ACA territory infarct with encephalomalacia. There is mild periventricular white matter low attenuation as can be seen with chronic microvascular disease.  The ventricles and sulci are appropriate for the patient's age. The basal cisterns are patent.  Visualized portions of the orbits are unremarkable. The  visualized portions of the paranasal sinuses and mastoid air cells are unremarkable.  The osseous structures are unremarkable.  IMPRESSION: 1. No acute intracranial pathology.   Electronically Signed   By: Elige Ko   On: 11/10/2014 13:21   Ct Angio Neck W/cm &/or Wo/cm  11/10/2014   CLINICAL DATA:  Sudden onset right-sided weakness and left gaze deviation after dialysis today. Code stroke head CT earlier today. TPA administered afterward.  EXAM: CT ANGIOGRAPHY HEAD AND NECK  TECHNIQUE: Multidetector CT imaging of the head and neck was performed using the standard protocol during bolus administration of intravenous contrast. Multiplanar CT image reconstructions and MIPs were obtained to evaluate the vascular anatomy. Carotid stenosis measurements (when applicable) are obtained utilizing NASCET criteria, using the distal internal carotid diameter as the denominator.  CONTRAST:  50mL OMNIPAQUE IOHEXOL 350 MG/ML SOLN  COMPARISON:  Head CT 11/10/2014 and MRI/MRA 06/20/2014  FINDINGS: CTA NECK  Aortic arch: 3 vessel aortic arch with mild atherosclerotic calcification. Brachiocephalic and subclavian arteries are patent without stenosis.  Right carotid system: Patent with moderate calcified plaque involving the carotid bifurcation and proximal ICA without stenosis. Mid cervical ICA is mildly tortuous.  Left carotid system: Patent with mild-to-moderate calcified plaque at the carotid bifurcation without stenosis. Proximal ICA is tortuous.  Vertebral arteries: Patent and balanced in size. There is mild stenosis of the left vertebral artery at its origin. Scattered calcified plaque is present throughout the course of both vertebral arteries without evidence of significant additional stenosis.  Skeleton: No acute osseous abnormality.  Other neck: Visualized lung apices demonstrate centrilobular emphysema as well as patchy subpleural opacities in the posterior right upper lobe and superior segment of the right lower lobe  which may represent atelectasis. Right jugular central venous catheter terminates in the SVC. An endotracheal tube is present. Fluid/secretions are present in the nasal cavity, nasopharynx and oropharynx. Polypoid mucosal thickening/mucous retention cyst is noted in the left sphenoid sinus.  CTA HEAD  Anterior circulation: Internal carotid arteries are patent from skullbase to carotid termini. There is carotid siphon calcification bilaterally with at most mild cavernous carotid stenosis bilaterally. Right MCA is unremarkable. Left M1 segment is patent without stenosis. Left MCA bifurcation is patent, however there is occlusion of an M2 superior division branch vessel approximately 2 cm distal to the MCA bifurcation. There is likely developing cytotoxic edema distal to this occlusion involving the left frontal operculum and more superior, posterior left frontal lobe. ACAs are patent without proximal stenosis, although there is moderate irregularity and attenuation of more distal A2 and A3 segments bilaterally, increased from prior MRA. No intracranial aneurysm is identified.  Posterior circulation: Intracranial vertebral arteries are patent to the basilar. There is mild right vertebral artery calcified plaque without stenosis. PICA origins are patent. Basilar artery is patent and mildly dolichoectatic without stenosis. Right AICA appears patent at its origin but with moderate to severe proximal tandem stenoses. SCA origins are patent. Posterior communicating arteries are not identified. PCAs are patent with mild branch vessel irregularity but no proximal stenosis.  Venous sinuses: Superior sagittal sinus is grossly patent. Transverse and sigmoid sinuses are not well evaluated due to arterial phase contrast timing.  Anatomic variants: None.  Delayed phase: Not performed at direction of stroke neurologist.  IMPRESSION: 1. 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 2. No proximal large vessel occlusion. 3.  Moderate irregular narrowing and attenuation of ACA branch vessels, progressed from prior MRA. 4. Mild-to-moderate plaque in the cervical carotid arteries without stenosis. 5. Mild proximal left vertebral artery stenosis.  These results were called by telephone at the time of interpretation on 11/10/2014 at 3:03 pm to [facility], [facility] , who verbally acknowledged these results.   Electronically Signed   By: Sebastian Ache   On: 11/10/2014 15:13   Portable Chest Xray  11/11/2014   CLINICAL DATA:  Respiratory failure.  EXAM: PORTABLE CHEST - 1 VIEW  COMPARISON:  11/10/2014.  FINDINGS: Endotracheal tube tip 4.1 cm above the carina. Right dialysis catheter stable position. Stable cardiomegaly. New onset left lower lobe atelectasis and consolidation. Small left pleural effusion. Right lung is clear.  IMPRESSION: 1. Lines and tubes in stable position. 2. New onset of dense left lower lobe atelectasis and consolidation. Small left pleural effusion.   Electronically Signed  ByMaisie Fus  Register   On: 11/11/2014 07:38   Dg Chest Portable 1 View  11/10/2014   CLINICAL DATA:  ETT placement  EXAM: PORTABLE CHEST - 1 VIEW  COMPARISON:  09/16/2014  FINDINGS: Endotracheal tube terminates 3 cm above the carina.  Cardiomegaly with pulmonary vascular congestion. No frank interstitial edema. No pleural effusion or pneumothorax.  Right IJ dual lumen dialysis catheter terminates in the SVC.  IMPRESSION: Endotracheal tube terminates 3 cm above the carina.   Electronically Signed   By: Charline Bills M.D.   On: 11/10/2014 13:03   Dg Abd Portable 1v  11/11/2014   CLINICAL DATA:  Gastrostomy tube placement.  Initial encounter.  EXAM: PORTABLE ABDOMEN - 1 VIEW  COMPARISON:  Abdominal radiograph performed 09/21/2014, and CT of the abdomen and pelvis performed 09/22/2014  FINDINGS: The patient's G-tube is noted ending overlying the body of the stomach, with contrast filling the fundus of the stomach.  The visualized bowel gas pattern is  grossly unremarkable. No free intra-abdominal air is seen, though evaluation for free air is limited on a single supine view.  Dense left basilar airspace opacification raises concern for pneumonia. No acute osseous abnormalities are seen.  IMPRESSION: 1. G-tube seen ending overlying the body of the stomach, with contrast filling the fundus of the stomach as expected. 2. Dense left basilar airspace opacification raises concern for pneumonia.   Electronically Signed   By: Roanna Raider M.D.   On: 11/11/2014 04:06        ASSESSMENT / PLAN:  PULMONARY OETT 8/9 >  A:Intubated for airway protection - Acute Reso failure due to stroke \   - not ready for extubation P:   \ Full vent support VAP bundle Daily SBT/WUA Daily CXR  CARDIOVASCULAR  A: Hypertension Pulmonary hypertension by echo> cause uncertain, has not had RHC PFO History of aortic aneurysm   - no acute issue  P:  BP management post TPA per neurology Tele  RENAL A:  ESRD P:   Needs R IJ perm cath removed this admission Renal consult 8/10 - being called Reduce IVF from 75cc/h to 10cc/h  GASTROINTESTINAL A:  Recent ischemic bowel, perforation, treated with surgery in [place] 2016 No acute issues now P:   NPO for now TF at goal ongoing   HEMATOLOGIC A:  S/p tPA 11/10/2014  P:  Monitor for bleeding CBC in AM  INFECTIOUS A:  No acute issues P:   Monitor for fever  ENDOCRINE A:  Hypoglycemia   - resolved after starting TF P:   DC D10  NEUROLOGIC A:  Acute L MCA stroke Acute encephalopathy post stroke> med related from intubation? ICH?   - still somnolent despite no sedation x 12h P:   Hold sedation now repeat head CT If waking up, use PAD protocol with intermittent fentanyl titrated to RASS 0   FAMILY  - Updates: sister in law updated bedside 8/9. DTr updated 11/11/2014   - Inter-disciplinary family meet or Palliative Care meeting due by:  7; from admit 11/10/2014. Neuro hasinitated process  11/11/2014      The patient is critically ill with multiple organ systems failure and requires high complexity decision making for assessment and support, frequent evaluation and titration of therapies, application of advanced monitoring technologies and extensive interpretation of multiple databases.   Critical Care Time devoted to patient care services described in this note is  30  Minutes. This time reflects time of care of this signee Dr Kalman Shan.  This critical care time does not reflect procedure time, or teaching time or supervisory time of PA/NP/Med student/Med Resident etc but could involve care discussion time    Dr. Kalman Shan, M.D., [facility].C.P Pulmonary and Critical Care Medicine Staff Physician [facility] Pager: [phone], If no answer or between  15:00h - 7:00h: call [phone]  11/11/2014 11:37 AM

## 2014-11-12 ENCOUNTER — Inpatient Hospital Stay (HOSPITAL_COMMUNITY): Payer: Medicaid Other

## 2014-11-12 DIAGNOSIS — I6789 Other cerebrovascular disease: Secondary | ICD-10-CM

## 2014-11-12 LAB — GLUCOSE, CAPILLARY
GLUCOSE-CAPILLARY: 84 mg/dL (ref 65–99)
Glucose-Capillary: 105 mg/dL — ABNORMAL HIGH (ref 65–99)
Glucose-Capillary: 108 mg/dL — ABNORMAL HIGH (ref 65–99)
Glucose-Capillary: 78 mg/dL (ref 65–99)
Glucose-Capillary: 93 mg/dL (ref 65–99)

## 2014-11-12 LAB — HEMOGLOBIN A1C
Hgb A1c MFr Bld: 4.8 % (ref 4.8–5.6)
Mean Plasma Glucose: 91 mg/dL

## 2014-11-12 MED ORDER — LIDOCAINE HCL (PF) 1 % IJ SOLN: 5.0000 mL | INTRAMUSCULAR | Status: DC | PRN

## 2014-11-12 MED ORDER — LEVALBUTEROL HCL 0.63 MG/3ML IN NEBU
0.6300 mg | INHALATION_SOLUTION | Freq: Four times a day (QID) | RESPIRATORY_TRACT | Status: DC
  Administered 2014-11-12 – 2014-11-23 (×42): 0.63 mg via RESPIRATORY_TRACT
  Filled 2014-11-12 (×78): qty 3

## 2014-11-12 MED ORDER — ALTEPLASE 2 MG IJ SOLR
2.0000 mg | Freq: Once | INTRAMUSCULAR | Status: DC | PRN
  Filled 2014-11-12: qty 2

## 2014-11-12 MED ORDER — CHLORHEXIDINE GLUCONATE 0.12 % MT SOLN
OROMUCOSAL | Status: AC
  Filled 2014-11-12: qty 15

## 2014-11-12 MED ORDER — SODIUM CHLORIDE 0.9 % IV SOLN: 100.0000 mL | INTRAVENOUS | Status: DC | PRN

## 2014-11-12 MED ORDER — NEPRO/CARBSTEADY PO LIQD
237.0000 mL | ORAL | Status: DC | PRN
  Administered 2014-11-16: 237 mL via ORAL
  Filled 2014-11-12 (×2): qty 237

## 2014-11-12 MED ORDER — LEVALBUTEROL HCL 0.63 MG/3ML IN NEBU
INHALATION_SOLUTION | RESPIRATORY_TRACT | Status: AC
  Filled 2014-11-12: qty 3

## 2014-11-12 MED ORDER — IPRATROPIUM BROMIDE 0.02 % IN SOLN
0.5000 mg | Freq: Four times a day (QID) | RESPIRATORY_TRACT | Status: DC
  Administered 2014-11-12 – 2014-11-23 (×42): 0.5 mg via RESPIRATORY_TRACT
  Filled 2014-11-12 (×42): qty 2.5

## 2014-11-12 MED ORDER — PENTAFLUOROPROP-TETRAFLUOROETH EX AERO: 1.0000 "application " | INHALATION_SPRAY | CUTANEOUS | Status: DC | PRN

## 2014-11-12 MED ORDER — LIDOCAINE-PRILOCAINE 2.5-2.5 % EX CREA
1.0000 "application " | TOPICAL_CREAM | CUTANEOUS | Status: DC | PRN
  Filled 2014-11-12: qty 5

## 2014-11-12 NOTE — Progress Notes (Signed)
PT Cancellation Note  Patient Details Name: [name] MRN: [id number] DOB: 1955-10-29   Cancelled Treatment:    Reason Eval/Treat Not Completed: Patient not medically ready; remains intubated and on bedrest.  Will attempt another day.   Jahlani Lorentz,CYNDI 11/12/2014, 4:16 PM Sheran Lawless, PT [phone] 11/12/2014

## 2014-11-12 NOTE — Progress Notes (Signed)
*  PRELIMINARY RESULTS* Echocardiogram 2D Echocardiogram has been performed.  [name] 11/12/2014, 11:09 AM

## 2014-11-12 NOTE — Progress Notes (Signed)
  [facility] Progress Note   Subjective: opens eyes today  Filed Vitals:   11/12/14 0600 11/12/14 0700 11/12/14 0800 11/12/14 0822  BP: 112/72 110/66    Pulse: 95 91    Temp:   98.2 F (36.8 C)   TempSrc:   Oral   Resp: 19 17    Height:      Weight:      SpO2: 97% 99%  100%   Exam: Opens eyes to voice  NO jvd Chest clear bilat no rales or wheezes RRR no MRG ABd obese, soft ntnd LUQ PEG tube in place, dec'd BS GU deferred Ext no sig LE or UE edema Neuro is unreponsive at this time, eyes closed, not responding to commands, not moving any extremities   TTS [place] 4h 2/2 [place] 91kg Heparin none Hect 2 ug Aranesp 200 ug/wk + venofer 50/wk  Assessment: 1. Acute CVA / R hemiparesis, s/p TPA 2. Recent CVA w left hemiparesis - left side weakness had mostly resolved per family 3. ESRD on HD TTS 4. Hx ruptured ulcer / PEG tube - was to have PEG removed, was eating well 5. HD access - using AVG, was to have TDC removed 6. Volume no excess, 1kg up 7. Chronic hypotension on midodrine 10 bid 8. Afib on amio, was not on coumadin due to comorbidities and risk of complications   Plan - HD today in ICU           [name] MD  pager [phone]    cell [phone]  11/12/2014, 10:02 AM     Recent Labs Lab 11/10/14 1220 11/10/14 1225 11/11/14 0330  NA 135 134* 136  K 3.4* 3.3* 3.3*  CL 97* 97* 97*  CO2 23  --  23  GLUCOSE 84 81 80  BUN 25* 28* 27*  CREATININE 5.66* 5.30* 6.05*  CALCIUM 8.0*  --  8.3*    Recent Labs Lab 11/10/14 1220  AST 23  ALT 16  ALKPHOS 74  BILITOT 1.3*  PROT 7.5  ALBUMIN 3.0*    Recent Labs Lab 11/10/14 1220 11/10/14 1225 11/11/14 0330  WBC 6.8  --  8.5  NEUTROABS 4.6  --  6.6  HGB 12.0 15.3* 12.6  HCT 36.9 45.0 38.9  MCV 90.2  --  89.8  PLT 202  --  186   . antiseptic oral rinse  7 mL Mouth Rinse QID  . aspirin  300 mg Rectal Daily  . budesonide  0.25 mg Nebulization BID  . chlorhexidine gluconate   15 mL Mouth Rinse BID  . famotidine (PEPCID) IV  20 mg Intravenous Q12H  . feeding supplement (PRO-STAT SUGAR FREE 64)  30 mL Per Tube 5 X Daily  . feeding supplement (VITAL HIGH PROTEIN)  1,000 mL Per Tube Q24H  . ipratropium  0.5 mg Nebulization Q6H  . levalbuterol  0.63 mg Nebulization Q6H  . levETIRAcetam  500 mg Per Tube BID  . levothyroxine  25 mcg Oral QAC breakfast  . pantoprazole (PROTONIX) IV  40 mg Intravenous QHS   . sodium chloride 10 mL/hr at 11/12/14 0900   sodium chloride, sodium chloride, acetaminophen **OR** acetaminophen, albuterol, alteplase, feeding supplement (NEPRO CARB STEADY), fentaNYL (SUBLIMAZE) injection, fentaNYL (SUBLIMAZE) injection, iohexol, lidocaine (PF), lidocaine-prilocaine, pentafluoroprop-tetrafluoroeth, senna-docusate

## 2014-11-12 NOTE — Progress Notes (Signed)
SLP Cancellation Note  Patient Details Name: [name] MRN: [id number] DOB: [birth year]   Cancelled treatment:       Reason Eval/Treat Not Completed: Medical issues which prohibited therapy Patient remains intubated. Will f/u 8/12.   Ferdinand Lango MA, CCC-SLP [phone]   [name] 11/12/2014, 8:15 AM

## 2014-11-12 NOTE — Progress Notes (Signed)
Noted pt w/ increased RR 40's and agitation.  Changed pt back to full vent support d/t RR and also in preparation for pt to go to MRI.

## 2014-11-12 NOTE — Progress Notes (Signed)
OT Cancellation Note  Patient Details Name: [name] MRN: [id number] DOB: [birth year]   Cancelled Treatment:    Reason Eval/Treat Not Completed: Patient not medically ready Pt on strict bedrest.Please update activity orders to initiate therapy. Thanks [facility] Oren Barella, OTR/L  [phone] 11/12/2014 11/12/2014, 7:15 AM

## 2014-11-12 NOTE — Progress Notes (Signed)
PULMONARY / CRITICAL CARE MEDICINE   Name: [name] MRN: [id number] DOB: 1956-03-14    ADMISSION DATE:  11/10/2014 CONSULTATION DATE:  11/10/2014  REFERRING MD :  Pearlean Brownie  CHIEF COMPLAINT:  Stroke, post TPA  INITIAL PRESENTATION:  This is a 59 y/o female who had an acute left MCA stroke today and received IV TPA.  She was intubated for airway protection in the ER.  She has a long history of ESRD, a recent R ACA stroke, and a recent history of ischemic bowel requiring resection.  Apparently today at dialysis she developed sudden onset of R sided weakness.  She was brought to the ER for further evaluation.  No further history could be obtained due to intubation.  Of note, after TPA she had bleeding from her AV fistula   STUDIES:  8/9 CT head > no acute abnormality 8/9 CT angiogram post tpa> L MCA m2 superior division branch vessel occlusion, moderate irregular narrowing and attenuation of ACA branch vessels, progressed from prior MRA 8/9 admission, IV TPA for L MCA stroke 8/10 Head Ct  >> evolving L MCA distribution CVA, no mass effect or shift  SUBJECTIVE/OVERNIGHT/INTERVAL HX Tolerated PSV 8/10 Head Ct done as above Dr Pearlean Brownie has discussed prognosis, predicted deficits with pt's daughter on 8/10  VITAL SIGNS: Temp:  [98.2 F (36.8 C)-100.2 F (37.9 C)] 98.2 F (36.8 C) (08/11 0800) Pulse Rate:  [88-116] 91 (08/11 0700) Resp:  [15-30] 17 (08/11 0700) BP: (101-160)/(59-89) 110/66 mmHg (08/11 0700) SpO2:  [96 %-100 %] 99 % (08/11 0700) FiO2 (%):  [40 %] 40 % (08/11 0313) Weight:  [93.5 kg (206 lb 2.1 oz)] 93.5 kg (206 lb 2.1 oz) (08/11 0500) HEMODYNAMICS:   VENTILATOR SETTINGS: Vent Mode:  [-] PRVC FiO2 (%):  [40 %] 40 % Set Rate:  [14 bmp] 14 bmp Vt Set:  [570 mL] 570 mL PEEP:  [5 cmH20] 5 cmH20 Pressure Support:  [14 cmH20] 14 cmH20 Plateau Pressure:  [15 cmH20-18 cmH20] 15 cmH20 INTAKE / OUTPUT:  Intake/Output Summary (Last 24 hours) at 11/12/14 4098 Last data filed  at 11/12/14 0600  Gross per 24 hour  Intake 1391.5 ml  Output     10 ml  Net 1381.5 ml    PHYSICAL EXAMINATION: General:  Chronically ill appearing, on vent Neuro:  Has weak cough to tracheal suctioning,  HEENT:  NCAT, ETT in place Cardiovascular:  RRR, systolic murmur noted, left upper arm AV fistula  Lungs:  Vent supported breaths,  Scattered exp wheezes / rhonchi Abdomen:  BS+, soft, nontender Musculoskeletal:  Diminished tone Skin:  R IJ perm cath> clean, PEG > clean  LABS: PULMONARY  Recent Labs Lab 11/10/14 1225 11/10/14 1325  PHART  --  7.331*  PCO2ART  --  52.3*  PO2ART  --  409.0*  HCO3  --  27.6*  TCO2 23 29  O2SAT  --  100.0    CBC  Recent Labs Lab 11/10/14 1220 11/10/14 1225 11/11/14 0330  HGB 12.0 15.3* 12.6  HCT 36.9 45.0 38.9  WBC 6.8  --  8.5  PLT 202  --  186    COAGULATION  Recent Labs Lab 11/10/14 1220  INR 1.29    CARDIAC  No results for input(s): TROPONINI in the last 168 hours. No results for input(s): PROBNP in the last 168 hours.   CHEMISTRY  Recent Labs Lab 11/10/14 1220 11/10/14 1225 11/11/14 0330  NA 135 134* 136  K 3.4* 3.3* 3.3*  CL 97*  97* 97*  CO2 23  --  23  GLUCOSE 84 81 80  BUN 25* 28* 27*  CREATININE 5.66* 5.30* 6.05*  CALCIUM 8.0*  --  8.3*   Estimated Creatinine Clearance: 11.9 mL/min (by C-G formula based on Cr of 6.05).   LIVER  Recent Labs Lab 11/10/14 1220  AST 23  ALT 16  ALKPHOS 74  BILITOT 1.3*  PROT 7.5  ALBUMIN 3.0*  INR 1.29     INFECTIOUS No results for input(s): LATICACIDVEN, PROCALCITON in the last 168 hours.   ENDOCRINE CBG (last 3)   Recent Labs  11/11/14 2344 11/12/14 0351 11/12/14 0813  GLUCAP 105* 108* 93         IMAGING x48h   Ct Angio Head W/cm &/or Wo Cm  11/10/2014   CLINICAL DATA:  Sudden onset right-sided weakness and left gaze deviation after dialysis today. Code stroke head CT earlier today. TPA administered afterward.  EXAM: CT ANGIOGRAPHY  HEAD AND NECK  TECHNIQUE: Multidetector CT imaging of the head and neck was performed using the standard protocol during bolus administration of intravenous contrast. Multiplanar CT image reconstructions and MIPs were obtained to evaluate the vascular anatomy. Carotid stenosis measurements (when applicable) are obtained utilizing NASCET criteria, using the distal internal carotid diameter as the denominator.  CONTRAST:  50mL OMNIPAQUE IOHEXOL 350 MG/ML SOLN  COMPARISON:  Head CT 11/10/2014 and MRI/MRA 06/20/2014  FINDINGS: CTA NECK  Aortic arch: 3 vessel aortic arch with mild atherosclerotic calcification. Brachiocephalic and subclavian arteries are patent without stenosis.  Right carotid system: Patent with moderate calcified plaque involving the carotid bifurcation and proximal ICA without stenosis. Mid cervical ICA is mildly tortuous.  Left carotid system: Patent with mild-to-moderate calcified plaque at the carotid bifurcation without stenosis. Proximal ICA is tortuous.  Vertebral arteries: Patent and balanced in size. There is mild stenosis of the left vertebral artery at its origin. Scattered calcified plaque is present throughout the course of both vertebral arteries without evidence of significant additional stenosis.  Skeleton: No acute osseous abnormality.  Other neck: Visualized lung apices demonstrate centrilobular emphysema as well as patchy subpleural opacities in the posterior right upper lobe and superior segment of the right lower lobe which may represent atelectasis. Right jugular central venous catheter terminates in the SVC. An endotracheal tube is present. Fluid/secretions are present in the nasal cavity, nasopharynx and oropharynx. Polypoid mucosal thickening/mucous retention cyst is noted in the left sphenoid sinus.  CTA HEAD  Anterior circulation: Internal carotid arteries are patent from skullbase to carotid termini. There is carotid siphon calcification bilaterally with at most mild  cavernous carotid stenosis bilaterally. Right MCA is unremarkable. Left M1 segment is patent without stenosis. Left MCA bifurcation is patent, however there is occlusion of an M2 superior division branch vessel approximately 2 cm distal to the MCA bifurcation. There is likely developing cytotoxic edema distal to this occlusion involving the left frontal operculum and more superior, posterior left frontal lobe. ACAs are patent without proximal stenosis, although there is moderate irregularity and attenuation of more distal A2 and A3 segments bilaterally, increased from prior MRA. No intracranial aneurysm is identified.  Posterior circulation: Intracranial vertebral arteries are patent to the basilar. There is mild right vertebral artery calcified plaque without stenosis. PICA origins are patent. Basilar artery is patent and mildly dolichoectatic without stenosis. Right AICA appears patent at its origin but with moderate to severe proximal tandem stenoses. SCA origins are patent. Posterior communicating arteries are not identified. PCAs are patent with  mild branch vessel irregularity but no proximal stenosis.  Venous sinuses: Superior sagittal sinus is grossly patent. Transverse and sigmoid sinuses are not well evaluated due to arterial phase contrast timing.  Anatomic variants: None.  Delayed phase: Not performed at direction of stroke neurologist.  IMPRESSION: 1. 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 2. No proximal large vessel occlusion. 3. Moderate irregular narrowing and attenuation of ACA branch vessels, progressed from prior MRA. 4. Mild-to-moderate plaque in the cervical carotid arteries without stenosis. 5. Mild proximal left vertebral artery stenosis.  These results were called by telephone at the time of interpretation on 11/10/2014 at 3:03 pm to [facility] [place] , who verbally acknowledged these results.   Electronically Signed   By: Sebastian Ache   On: 11/10/2014 15:13   Ct Head Wo  Contrast  11/11/2014   CLINICAL DATA:  Code stroke.  Status post tPA yesterday.  EXAM: CT HEAD WITHOUT CONTRAST  TECHNIQUE: Contiguous axial images were obtained from the base of the skull through the vertex without intravenous contrast.  COMPARISON:  1 day prior  FINDINGS: Sinuses/Soft tissues: Left sphenoid sinus mucous retention cyst or polyp. Minimal mucosal thickening right sphenoid sinus. Remote left medial orbital wall trauma. Clear mastoid air cells.  Intracranial: Age advanced cerebral and cerebellar atrophy. Increased conspicuity of subacute left anterior MCA distribution infarct. No significant mass effect or midline shift. No hemorrhage, hydrocephalus, intra-axial, or extra-axial fluid collection. Bilateral vertebral artery atherosclerosis which is age advanced.  IMPRESSION: 1. No evidence of intracranial hemorrhage. 2. Expected evolution of left MCA distribution subacute infarct. 3. Sinus disease. 4. Intracranial atherosclerosis.   Electronically Signed   By: Jeronimo Greaves M.D.   On: 11/11/2014 15:14   Ct Head Wo Contrast  11/10/2014   CLINICAL DATA:  Status post tPA for LEFT MCA stroke. RIGHT hemiparesis. Chronic renal failure.  EXAM: CT HEAD WITHOUT CONTRAST  TECHNIQUE: Contiguous axial images were obtained from the base of the skull through the vertex without intravenous contrast.  COMPARISON:  Code stroke CT head earlier today. CT angiography head and neck earlier today.  FINDINGS: Residual blood pool from recent CTA.  Early cytotoxic edema is now observed in the LEFT temporal operculum, LEFT frontal operculum, and LEFT insula. Previously noted thrombus or embolus in the LEFT M2 MCA (image 13 series 201 of the prior noncontrast CT head) is less prominent. No post t-PA hemorrhage.  Unchanged mild atrophy with chronic RIGHT ACA infarct. No midline shift.  IMPRESSION: No evidence for post t-PA hemorrhage. Early cytotoxic edema now noted in the LEFT hemisphere.   Electronically Signed   By: Elsie Stain M.D.   On: 11/10/2014 17:47   Ct Head Wo Contrast  11/10/2014   CLINICAL DATA:  Code stroke.  Right-sided weakness.  EXAM: CT HEAD WITHOUT CONTRAST  TECHNIQUE: Contiguous axial images were obtained from the base of the skull through the vertex without intravenous contrast.  COMPARISON:  06/19/2014, MR brain 06/20/2014  FINDINGS: There is no evidence of mass effect, midline shift or extra-axial fluid collections. There is no evidence of a space-occupying lesion or intracranial hemorrhage. There is no evidence of a cortical-based area of acute infarction. There is an old right ACA territory infarct with encephalomalacia. There is a small left ACA territory infarct with encephalomalacia. There is mild periventricular white matter low attenuation as can be seen with chronic microvascular disease.  The ventricles and sulci are appropriate for the patient's age. The basal cisterns are patent.  Visualized  portions of the orbits are unremarkable. The visualized portions of the paranasal sinuses and mastoid air cells are unremarkable.  The osseous structures are unremarkable.  IMPRESSION: 1. No acute intracranial pathology.   Electronically Signed   By: Elige Ko   On: 11/10/2014 13:21   Ct Angio Neck W/cm &/or Wo/cm  11/10/2014   CLINICAL DATA:  Sudden onset right-sided weakness and left gaze deviation after dialysis today. Code stroke head CT earlier today. TPA administered afterward.  EXAM: CT ANGIOGRAPHY HEAD AND NECK  TECHNIQUE: Multidetector CT imaging of the head and neck was performed using the standard protocol during bolus administration of intravenous contrast. Multiplanar CT image reconstructions and MIPs were obtained to evaluate the vascular anatomy. Carotid stenosis measurements (when applicable) are obtained utilizing NASCET criteria, using the distal internal carotid diameter as the denominator.  CONTRAST:  50mL OMNIPAQUE IOHEXOL 350 MG/ML SOLN  COMPARISON:  Head CT 11/10/2014 and MRI/MRA  06/20/2014  FINDINGS: CTA NECK  Aortic arch: 3 vessel aortic arch with mild atherosclerotic calcification. Brachiocephalic and subclavian arteries are patent without stenosis.  Right carotid system: Patent with moderate calcified plaque involving the carotid bifurcation and proximal ICA without stenosis. Mid cervical ICA is mildly tortuous.  Left carotid system: Patent with mild-to-moderate calcified plaque at the carotid bifurcation without stenosis. Proximal ICA is tortuous.  Vertebral arteries: Patent and balanced in size. There is mild stenosis of the left vertebral artery at its origin. Scattered calcified plaque is present throughout the course of both vertebral arteries without evidence of significant additional stenosis.  Skeleton: No acute osseous abnormality.  Other neck: Visualized lung apices demonstrate centrilobular emphysema as well as patchy subpleural opacities in the posterior right upper lobe and superior segment of the right lower lobe which may represent atelectasis. Right jugular central venous catheter terminates in the SVC. An endotracheal tube is present. Fluid/secretions are present in the nasal cavity, nasopharynx and oropharynx. Polypoid mucosal thickening/mucous retention cyst is noted in the left sphenoid sinus.  CTA HEAD  Anterior circulation: Internal carotid arteries are patent from skullbase to carotid termini. There is carotid siphon calcification bilaterally with at most mild cavernous carotid stenosis bilaterally. Right MCA is unremarkable. Left M1 segment is patent without stenosis. Left MCA bifurcation is patent, however there is occlusion of an M2 superior division branch vessel approximately 2 cm distal to the MCA bifurcation. There is likely developing cytotoxic edema distal to this occlusion involving the left frontal operculum and more superior, posterior left frontal lobe. ACAs are patent without proximal stenosis, although there is moderate irregularity and attenuation  of more distal A2 and A3 segments bilaterally, increased from prior MRA. No intracranial aneurysm is identified.  Posterior circulation: Intracranial vertebral arteries are patent to the basilar. There is mild right vertebral artery calcified plaque without stenosis. PICA origins are patent. Basilar artery is patent and mildly dolichoectatic without stenosis. Right AICA appears patent at its origin but with moderate to severe proximal tandem stenoses. SCA origins are patent. Posterior communicating arteries are not identified. PCAs are patent with mild branch vessel irregularity but no proximal stenosis.  Venous sinuses: Superior sagittal sinus is grossly patent. Transverse and sigmoid sinuses are not well evaluated due to arterial phase contrast timing.  Anatomic variants: None.  Delayed phase: Not performed at direction of stroke neurologist.  IMPRESSION: 1. 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 2. No proximal large vessel occlusion. 3. Moderate irregular narrowing and attenuation of ACA branch vessels, progressed from prior MRA. 4. Mild-to-moderate  plaque in the cervical carotid arteries without stenosis. 5. Mild proximal left vertebral artery stenosis.  These results were called by telephone at the time of interpretation on 11/10/2014 at 3:03 pm to [facility] [place] , who verbally acknowledged these results.   Electronically Signed   By: Sebastian Ache   On: 11/10/2014 15:13   Portable Chest Xray  11/11/2014   CLINICAL DATA:  Respiratory failure.  EXAM: PORTABLE CHEST - 1 VIEW  COMPARISON:  11/10/2014.  FINDINGS: Endotracheal tube tip 4.1 cm above the carina. Right dialysis catheter stable position. Stable cardiomegaly. New onset left lower lobe atelectasis and consolidation. Small left pleural effusion. Right lung is clear.  IMPRESSION: 1. Lines and tubes in stable position. 2. New onset of dense left lower lobe atelectasis and consolidation. Small left pleural effusion.   Electronically Signed    By: Maisie Fus  Register   On: 11/11/2014 07:38   Dg Chest Portable 1 View  11/10/2014   CLINICAL DATA:  ETT placement  EXAM: PORTABLE CHEST - 1 VIEW  COMPARISON:  09/16/2014  FINDINGS: Endotracheal tube terminates 3 cm above the carina.  Cardiomegaly with pulmonary vascular congestion. No frank interstitial edema. No pleural effusion or pneumothorax.  Right IJ dual lumen dialysis catheter terminates in the SVC.  IMPRESSION: Endotracheal tube terminates 3 cm above the carina.   Electronically Signed   By: Charline Bills M.D.   On: 11/10/2014 13:03   Dg Abd Portable 1v  11/11/2014   CLINICAL DATA:  Gastrostomy tube placement.  Initial encounter.  EXAM: PORTABLE ABDOMEN - 1 VIEW  COMPARISON:  Abdominal radiograph performed 09/21/2014, and CT of the abdomen and pelvis performed 09/22/2014  FINDINGS: The patient's G-tube is noted ending overlying the body of the stomach, with contrast filling the fundus of the stomach.  The visualized bowel gas pattern is grossly unremarkable. No free intra-abdominal air is seen, though evaluation for free air is limited on a single supine view.  Dense left basilar airspace opacification raises concern for pneumonia. No acute osseous abnormalities are seen.  IMPRESSION: 1. G-tube seen ending overlying the body of the stomach, with contrast filling the fundus of the stomach as expected. 2. Dense left basilar airspace opacification raises concern for pneumonia.   Electronically Signed   By: Roanna Raider M.D.   On: 11/11/2014 04:06        ASSESSMENT / PLAN:  PULMONARY OETT 8/9 >  A:Intubated for airway protection - Acute Resp failure due to stroke  COPD without evidence for AE P:   Full vent support VAP bundle Scheduled BD's ordered; consider steroids if more overt wheezing evolves Daily SBT/WUA, push PSV as able to tolerate. MS and secretion management will be limiting factors for extubation Will need to clarify with pt's daughter whether we would reintubate if pt  failed extubation; given her MMP I would advocate a one-way extubation when we believe she meets criteria. Dr Pearlean Brownie has begun discussions w pt's daughter about this 8/10 Follow CXR  CARDIOVASCULAR  A: Hypertension Pulmonary hypertension by echo> cause uncertain but likely due to systemic HTN + intrinsic lung disease, has not had RHC PFO History of aortic aneurysm P:  BP management post TPA per neurology Tele  RENAL A:  ESRD Hypokalemia  P:   Needs R IJ perm cath removed this admission Renal following, for HD on TThS IVF 10cc/h  GASTROINTESTINAL A:  Recent ischemic bowel, perforation, treated with surgery in [place] 2016 No acute issues now P:   NPO for  now TF at goal ongoing She was approaching removal of PEG, was taking PO   HEMATOLOGIC A:  S/p tPA 11/10/2014 P:  Monitor for bleeding Follow CBC  INFECTIOUS A:  No acute issues P:   Monitor for fever  ENDOCRINE A:  Hypoglycemia  - resolved after starting TF P:   Follow glucose  NEUROLOGIC A:  Acute L MCA stroke Acute encephalopathy post stroke P:   Hold sedation Planning for MRI brain Planning for LR dopplers given hx PFO If waking up, use PAD protocol with intermittent fentanyl titrated to RASS 0   FAMILY  - Updates: sister in law updated bedside 8/9. Daughter updated 11/11/2014   - Inter-disciplinary family meet or Palliative Care meeting due by: 11/17/14;  Neuro has initated process 11/11/14   Independent CC time 35 minutes   Levy Pupa, MD, PhD 11/12/2014, 8:33 AM [facility] [phone] or if no answer [phone]

## 2014-11-12 NOTE — Progress Notes (Signed)
STROKE TEAM PROGRESS NOTE   HISTORY [name] is an 59 y.o. female who was at dialysis and finished her session. After dialysis while still at the center she was noted to have sudden onset right sided weakness, left gaze deviation (LKW 11/10/2014 at 1106). EMS was called and on site BG was noted to be 50. Patient was given D50 and was transported to [facility] ED. On arrival she had labored respirations was not protecting her airway, and required intubation. CT of head was delayed due to need for intubation. CT head obtained and showed no acte stroke or bleed. Patient was deemed to be a candidate for TPA and was brought back to the ED. Dialysis needles were still present in the patient's AV fistula. IV team was assaulted and removed the hemodialysis needles as well as achieved hemostasis. IV tPA was then administered. CT angiogram of the head and neck was obtained which showed no large vessel proximal occlusion. She was subsequently admitted to the neuro intensive care unit for further management.    SUBJECTIVE (INTERVAL HISTORY) Her RT is at the bedside. She is breathing over the vent without difficulty. No family present. Remains aphasic with right hemiparesis. MRI not done yet.EEG no seizures. LE venous dopplers pending   OBJECTIVE Temp:  [98.2 F (36.8 C)-100.2 F (37.9 C)] 98.2 F (36.8 C) (08/11 0800) Pulse Rate:  [88-116] 91 (08/11 0700) Cardiac Rhythm:  [-] Normal sinus rhythm (08/10 2300) Resp:  [15-30] 17 (08/11 0700) BP: (101-160)/(59-89) 110/66 mmHg (08/11 0700) SpO2:  [96 %-100 %] 100 % (08/11 0822) FiO2 (%):  [40 %] 40 % (08/11 0822) Weight:  [206 lb 2.1 oz (93.5 kg)] 206 lb 2.1 oz (93.5 kg) (08/11 0500)   Recent Labs Lab 11/11/14 1611 11/11/14 1945 11/11/14 2344 11/12/14 0351 11/12/14 0813  GLUCAP 95 118* 105* 108* 93    Recent Labs Lab 11/10/14 1220 11/10/14 1225 11/11/14 0330  NA 135 134* 136  K 3.4* 3.3* 3.3*  CL 97* 97* 97*  CO2 23  --  23  GLUCOSE 84 81 80   BUN 25* 28* 27*  CREATININE 5.66* 5.30* 6.05*  CALCIUM 8.0*  --  8.3*    Recent Labs Lab 11/10/14 1220  AST 23  ALT 16  ALKPHOS 74  BILITOT 1.3*  PROT 7.5  ALBUMIN 3.0*    Recent Labs Lab 11/10/14 1220 11/10/14 1225 11/11/14 0330  WBC 6.8  --  8.5  NEUTROABS 4.6  --  6.6  HGB 12.0 15.3* 12.6  HCT 36.9 45.0 38.9  MCV 90.2  --  89.8  PLT 202  --  186   No results for input(s): CKTOTAL, CKMB, CKMBINDEX, TROPONINI in the last 168 hours.  Recent Labs  11/10/14 1220  LABPROT 16.3*  INR 1.29    Recent Labs  11/10/14 1336  COLORURINE YELLOW  LABSPEC 1.013  PHURINE 5.0  GLUCOSEU NEGATIVE  HGBUR NEGATIVE  BILIRUBINUR SMALL*  KETONESUR 15*  PROTEINUR 100*  UROBILINOGEN 1.0  NITRITE NEGATIVE  LEUKOCYTESUR NEGATIVE       Component Value Date/Time   CHOL 135 11/11/2014 0330   TRIG 95 11/11/2014 0330   HDL 37* 11/11/2014 0330   CHOLHDL 3.6 11/11/2014 0330   VLDL 19 11/11/2014 0330   LDLCALC 79 11/11/2014 0330   Lab Results  Component Value Date   HGBA1C 4.8 11/11/2014      Component Value Date/Time   LABOPIA NONE DETECTED 11/10/2014 1336   COCAINSCRNUR NONE DETECTED 11/10/2014 1336  LABBENZ POSITIVE* 11/10/2014 1336   AMPHETMU NONE DETECTED 11/10/2014 1336   THCU POSITIVE* 11/10/2014 1336   LABBARB NONE DETECTED 11/10/2014 1336     Recent Labs Lab 11/10/14 1220  ETH <5     IMAGING  Ct Head Wo Contrast 11/10/2014    No evidence for post t-PA hemorrhage. Early cytotoxic edema now noted in the LEFT hemisphere.    11/10/2014   1. No acute intracranial pathology.     Ct Angio Head & Neck W/cm &/or Wo/cm 11/10/2014   1. 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 2. No proximal large vessel occlusion. 3. Moderate irregular narrowing and attenuation of ACA branch vessels, progressed from prior MRA. 4. Mild-to-moderate plaque in the cervical carotid arteries without stenosis. 5. Mild proximal left vertebral artery stenosis.    Portable Chest  Xray 11/11/2014    1. Lines and tubes in stable position. 2. New onset of dense left lower lobe atelectasis and consolidation. Small left pleural effusion.    11/10/2014   Endotracheal tube terminates 3 cm above the carina.     Dg Abd Portable 1v 11/11/2014   1. G-tube seen ending overlying the body of the stomach, with contrast filling the fundus of the stomach as expected. 2. Dense left basilar airspace opacification raises concern for pneumonia.     EEG : mild global cerebral dysfunction as seen in a wide variety of toxic metabolic encephalopathies and degenerative brain disorders. No electrographic seizures noted. 2DEcho :07/23/14 :  Left ventricle: The cavity size was normal. There was moderate concentric hypertrophy. Systolic function was normal. The estimated ejection fraction was in the range of 60% to 65%. Carotid Dopplers 06/22/2014 : The vertebral arteries appear patent with antegrade flow. - Findings consistent with 1- 39 percent stenosis involving the right internal carotid artery and the left internal carotid artery. PHYSICAL EXAM elderly African-American lady who is intubated. . Afebrile. Head is nontraumatic. Neck is supple without bruit.    Cardiac exam no murmur or gallop. Lungs are clear to auscultation. Distal pulses are well felt. Neurological Exam :  Eyes open .Aphasic. Not following commands but will track.. Left gaze preference. Unable to look to the right past midline even with doll's eye movements. Pupils irregular but reactive 3 mm. Fundi could not be visualized. Vision acuity cannot be tested. Blinks to threat minimally on the right and partially on the left. Right lower facial weakness. Tongue midline.  Marland Kitchen Spontaneous left upper and lower extremity moments mild right-sided weakness but able to withdraw to pain. Deep tendon reflexes are depressed. Plantars are both equivocal. Sensation and coordination cannot be reliably tested. Gait was not  tested. ASSESSMENT/PLAN [name] is a 59 y.o. female with history of multiple medical problems including ESRD, recent stroke and recent bowel ischemia leading to resection presenting with right sided weakness, left gaze deviation. She received IV t-PA 11/10/2014 at 1340.   Stroke:  Dominant left MCA infarct embolic secondary to known atrial fibrillation   Resultant  VDRF, right hemiparesis, aphasia  MRI  pending   CTA head & neck L M2 branch occlusion. ACA atherosclerosis progressed since March. Plaque in bilateral ICAs and L VA. No proximal large vessel occlusion  2D Echo see 07/23/2014   TEE 06/23/2014 PFO. Severe RV dysfunction, RAE, TR. Incessant atrial tachycardia  Given known PFO, check LE venous dopplers  LDL 79  HgbA1c 6.5 in March  SCDs for VTE prophylaxis (listed heparin antibody positive) Diet NPO time specified  aspirin  81 mg orally every day prior to admission, now on no antithrombotic as within 24h of IV TPA. Recommend resumption of aspirin if 24h imaging negative for hemorrhage  Ongoing aggressive stroke risk factor management  Continue ICU level care  Therapy recommendations:  pending   Disposition:  pending   Paroxysmal Atrial Fibrillation  Documented on tele 07/27/2014  Decided not an anticoagulation candidate at that time as risk outweighs benefits given multiple medical issues (see note 07/28/2014)  Respiratory Failure  Intubated in ED  Recommend addressing code status prior to extubation   Possible seizure  Intermittent, arrhythmic twitching of R hand  Started on Keppra, 1 gm load followed by 500 mg bid  EEG pending   AcceleratedHypertension Pulmonary hypertension   BP 189/118 on arrival  Improved over night  Not on BP meds as NPO  Hyperlipidemia  Home meds:  No statin  LDL 79, goal < 70  Add statin once able to swallow  Diabetes  Hypoglycemia over night w/ dextrose given  HgbA1c 6.5 in March, at goal <  7.0  Controlled  Other Stroke Risk Factors  Cigarette smoker, advised to stop smoking  ETOH use  UDS positive for benzos and THC  Obesity, Body mass index is 32.28 kg/(m^2).   Hx stroke/TIA - Right hemisphere acute infarcts involving right ACA, left PCA, and left MCA, cardioembolic pattern, pt w/ PAF during prolonged hospitalization 06/2014 at that time "Given her multitude of co morbidities (including fistula) she would not be an optimal candidate for anticoagulation. Risks outweigh benefits. 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 I have made any additions or clarifications directly to the above note. Agree with note above.  She has presented with aphasia and right-sided weakness due to to suspected embolic left MCA infarct. She has received IV tPA.Marland KitchenShe  remains at risk for neurological worsening,brain hemorrhage, recurrent stroke/TIAs and needs ongoing stroke evaluation and aggressive risk factor modification. Recommend strict control of blood pressure and close neurological monitoring.Check MRI brain today. I spoke with patient`s daughter 11/11/14   about goals of care and DO NOT RESUSCITATE status prior to extubation.She will think about it and let us know. D/w Dr Delton Coombes.Prognosis is guarded given large stroke and multiple comorbidities. This patient is critically ill and at significant risk of neurological worsening, death and care requires constant monitoring of vital signs, hemodynamics,respiratory and cardiac  monitoring, extensive review of multiple databases, frequent neurological assessment, discussion with family, other specialists and medical decision making of high complexity.I have made any additions or clarifications directly to the above note.This critical care time does not reflect procedure time, or teaching time or supervisory time of PA/NP/Med Resident etc but could involve care discussion time.  I spent 35 minutes of neurocritical care time  in the care of  this patient.  Delia Heady, MD Medical Director [facility] Pager: [phone] 11/12/2014 8:28 AM   To contact Stroke Continuity provider, please refer to [email or web address]. After hours, contact General Neurology

## 2014-11-12 NOTE — Progress Notes (Signed)
SBT attempted, pt w/ increased RR.  Per Neuro MD, no plans for extubation today.  PS increased to 14. MD at bedside and aware.

## 2014-11-13 ENCOUNTER — Inpatient Hospital Stay (HOSPITAL_COMMUNITY): Payer: Medicaid Other

## 2014-11-13 DIAGNOSIS — Q211 Atrial septal defect: Secondary | ICD-10-CM

## 2014-11-13 DIAGNOSIS — Q2112 Patent foramen ovale: Secondary | ICD-10-CM | POA: Diagnosis present

## 2014-11-13 LAB — HEPATITIS B SURFACE ANTIGEN: Hepatitis B Surface Ag: NEGATIVE

## 2014-11-13 LAB — BASIC METABOLIC PANEL
ANION GAP: 13 (ref 5–15)
BUN: 41 mg/dL — ABNORMAL HIGH (ref 6–20)
CALCIUM: 8.7 mg/dL — AB (ref 8.9–10.3)
CO2: 24 mmol/L (ref 22–32)
CREATININE: 6.42 mg/dL — AB (ref 0.44–1.00)
Chloride: 98 mmol/L — ABNORMAL LOW (ref 101–111)
GFR calc Af Amer: 7 mL/min — ABNORMAL LOW (ref >60)
GFR calc non Af Amer: 6 mL/min — ABNORMAL LOW (ref >60)
Glucose, Bld: 86 mg/dL (ref 65–99)
Potassium: 3.8 mmol/L (ref 3.5–5.1)
Sodium: 135 mmol/L (ref 135–145)

## 2014-11-13 LAB — MAGNESIUM: Magnesium: 1.9 mg/dL (ref 1.7–2.4)

## 2014-11-13 LAB — CBC
HEMATOCRIT: 32.6 % — AB (ref 36.0–46.0)
Hemoglobin: 10.7 g/dL — ABNORMAL LOW (ref 12.0–15.0)
MCH: 29 pg (ref 26.0–34.0)
MCHC: 32.8 g/dL (ref 30.0–36.0)
MCV: 88.3 fL (ref 78.0–100.0)
Platelets: 172 10*3/uL (ref 150–400)
RBC: 3.69 MIL/uL — ABNORMAL LOW (ref 3.87–5.11)
RDW: 19.5 % — ABNORMAL HIGH (ref 11.5–15.5)
WBC: 7.4 10*3/uL (ref 4.0–10.5)

## 2014-11-13 LAB — PHOSPHORUS: Phosphorus: 5.3 mg/dL — ABNORMAL HIGH (ref 2.5–4.6)

## 2014-11-13 LAB — GLUCOSE, CAPILLARY
GLUCOSE-CAPILLARY: 72 mg/dL (ref 65–99)
Glucose-Capillary: 104 mg/dL — ABNORMAL HIGH (ref 65–99)
Glucose-Capillary: 109 mg/dL — ABNORMAL HIGH (ref 65–99)
Glucose-Capillary: 79 mg/dL (ref 65–99)
Glucose-Capillary: 81 mg/dL (ref 65–99)
Glucose-Capillary: 85 mg/dL (ref 65–99)
Glucose-Capillary: 88 mg/dL (ref 65–99)

## 2014-11-13 MED ORDER — CHLORHEXIDINE GLUCONATE 0.12% ORAL RINSE (MEDLINE KIT)
15.0000 mL | Freq: Two times a day (BID) | OROMUCOSAL | Status: DC
  Administered 2014-11-13 – 2014-11-25 (×24): 15 mL via OROMUCOSAL
  Filled 2014-11-13: qty 15

## 2014-11-13 NOTE — Progress Notes (Signed)
PT Cancellation Note/Discharge  Patient Details Name: [name] MRN: [id number] DOB: 1955/11/19   Cancelled Treatment:    Reason Eval/Treat Not Completed: Patient not medically ready; RN reports patient still intubated and with tenuous respiratory status.  Will sign off and await new order when ready for PT.   Asaf Elmquist,CYNDI 11/13/2014, 10:31 AM  Sheran Lawless, PT [phone] 11/13/2014

## 2014-11-13 NOTE — Progress Notes (Signed)
*  PRELIMINARY RESULTS* Vascular Ultrasound Lower extremity venous duplex has been completed.  Preliminary findings: negative for DVT. Pulsatile venous flow suggests increased right sided heart pressure.  Farrel Demark, RDMS, RVT  11/13/2014, 9:42 AM

## 2014-11-13 NOTE — Progress Notes (Signed)
Pt became very agitated on SBT, increased PS to 14 then 20, pt still agitated and increased WOB noted.  Placed pt back on full vent rest mode.  Pt appeared to be less agitated on full vent rest mode.  RN aware.

## 2014-11-13 NOTE — Progress Notes (Signed)
[facility] Progress Note   Subjective: doing a little better from neuro standpoint  Filed Vitals:   11/13/14 0820 11/13/14 0822 11/13/14 1100 11/13/14 1139  BP:  143/86  124/73  Pulse:  99  93  Temp:   99.1 F (37.3 C)   TempSrc:   Oral   Resp:  29  18  Height:      Weight:      SpO2: 98% 98%  100%   Exam: Opens eyes to voice , not following commands NO jvd Chest clear bilat no rales or wheezes RRR no MRG ABd obese, soft ntnd LUQ PEG tube in place, dec'd BS GU deferred Ext 1+ upper leg edema bilat Neuro opens eyes, moves L arm some LUA AVF +bruit  TTS [place] 4h 2/2 [place] 91kg Heparin none Hect 2 ug Aranesp 200 ug/wk + venofer 50/wk  Assessment: 1. Acute CVA / R hemiparesis, s/p TPA 2. Recent CVA w left hemiparesis (March '16) - left side weakness had mostly resolved per family 3. VDRF 4. ESRD on HD TTS 5. Volume - is 1-2 kg up 6. BP - was on midodrine, holding as BP's are in normal range 7. Hx ruptured ulcer / PEG tube - was not using PEG at home, was getting ready to have it dc'd 8. HD access - using AVF, was to have TDC removed. Will keep in for now.  9. Afib on amio, was not on coumadin due to comorbidities and risk of complications   Plan - HD tomorrow in ICU, use AVF, keep BP over 110 on HD        [name] MD  pager [phone]    cell [phone]  11/13/2014, 12:19 PM     Recent Labs Lab 11/10/14 1220 11/10/14 1225 11/11/14 0330 11/13/14 0529  NA 135 134* 136 135  K 3.4* 3.3* 3.3* 3.8  CL 97* 97* 97* 98*  CO2 23  --  23 24  GLUCOSE 84 81 80 86  BUN 25* 28* 27* 41*  CREATININE 5.66* 5.30* 6.05* 6.42*  CALCIUM 8.0*  --  8.3* 8.7*  PHOS  --   --   --  5.3*    Recent Labs Lab 11/10/14 1220  AST 23  ALT 16  ALKPHOS 74  BILITOT 1.3*  PROT 7.5  ALBUMIN 3.0*    Recent Labs Lab 11/10/14 1220 11/10/14 1225 11/11/14 0330 11/13/14 0323  WBC 6.8  --  8.5 7.4  NEUTROABS 4.6  --  6.6  --   HGB 12.0 15.3* 12.6  10.7*  HCT 36.9 45.0 38.9 32.6*  MCV 90.2  --  89.8 88.3  PLT 202  --  186 172   . antiseptic oral rinse  7 mL Mouth Rinse QID  . aspirin  300 mg Rectal Daily  . budesonide  0.25 mg Nebulization BID  . chlorhexidine gluconate  15 mL Mouth Rinse BID  . famotidine (PEPCID) IV  20 mg Intravenous Q12H  . feeding supplement (PRO-STAT SUGAR FREE 64)  30 mL Per Tube 5 X Daily  . feeding supplement (VITAL HIGH PROTEIN)  1,000 mL Per Tube Q24H  . ipratropium  0.5 mg Nebulization Q6H  . levalbuterol  0.63 mg Nebulization Q6H  . levETIRAcetam  500 mg Per Tube BID  . levothyroxine  25 mcg Oral QAC breakfast  . pantoprazole (PROTONIX) IV  40 mg Intravenous QHS   . sodium chloride 10 mL/hr at 11/13/14 0700   sodium chloride, sodium chloride, acetaminophen **OR**  acetaminophen, albuterol, alteplase, feeding supplement (NEPRO CARB STEADY), fentaNYL (SUBLIMAZE) injection, fentaNYL (SUBLIMAZE) injection, iohexol, lidocaine (PF), lidocaine-prilocaine, pentafluoroprop-tetrafluoroeth, senna-docusate

## 2014-11-13 NOTE — Care Management Note (Signed)
Case Management Note  Patient Details  Name: [name] MRN: [id number] Date of Birth: 1955/11/18  Subjective/Objective:     Pt admitted on 11/10/14 with CVA.  PTA, pt resided at home and was fairly independent, per report.                 Action/Plan: Will follow for discharge needs as pt progresses.   Expected Discharge Date:                  Expected Discharge Plan:  Skilled Nursing Facility  In-House Referral:  Clinical Social Work  Discharge planning Services  CM Consult  Post Acute Care Choice:    Choice offered to:     DME Arranged:    DME Agency:     HH Arranged:    HH Agency:     Status of Service:  In process, will continue to follow  Medicare Important Message Given:    Date Medicare IM Given:    Medicare IM give by:    Date Additional Medicare IM Given:    Additional Medicare Important Message give by:     If discussed at Long Length of Stay Meetings, dates discussed:    Additional Comments:  Quintella Baton, RN, BSN  Trauma/Neuro ICU Case Manager [phone]

## 2014-11-13 NOTE — Progress Notes (Signed)
OT Cancellation Note  Patient Details Name: [name] MRN: [id number] DOB: 1955-06-02   Cancelled Treatment:    Reason Eval/Treat Not Completed: Patient not medically ready - Pt remains intubated with tenuous respiratory status per RN.  Will sign off at this time, please reorder OT once pt medically stable.   Angelene Giovanni [place], OTR/L [phone]  11/13/2014, 12:23 PM

## 2014-11-13 NOTE — Progress Notes (Signed)
PULMONARY / CRITICAL CARE MEDICINE   Name: [name] MRN: [id number] DOB: [birth year]    ADMISSION DATE:  11/10/2014 CONSULTATION DATE:  11/10/2014  REFERRING MD :  Pearlean Brownie  CHIEF COMPLAINT:  Stroke, post TPA  INITIAL PRESENTATION:  This is a 59 y/o female who had an acute left MCA stroke today and received IV TPA.  She was intubated for airway protection in the ER.  She has a long history of ESRD, a recent R ACA stroke, and a recent history of ischemic bowel requiring resection.  Apparently today at dialysis she developed sudden onset of R sided weakness.  She was brought to the ER for further evaluation.  No further history could be obtained due to intubation.  Of note, after TPA she had bleeding from her AV fistula   STUDIES:  8/9 CT head > no acute abnormality 8/9 CT angiogram post tpa> L MCA m2 superior division branch vessel occlusion, moderate irregular narrowing and attenuation of ACA branch vessels, progressed from prior MRA 8/9 admission, IV TPA for L MCA stroke  11/11/14: CT head pending. Neuro Dr Pearlean Brownie discussing goals with dtr. Dtr denied any questions to me. On prn sedation - receieved none - followed some command with delayed response but somnolent    SUBJECTIVE/OVERNIGHT/INTERVAL HX 11/13/2014: 11/12/14 Moderate MCAon MRI yesterday.  Still on vent. Family not at bedside. No change noted other than agitation overnight with same during SBT today. Lot of resp sexcretions. RN says dtr - very reluctant for DNR   VITAL SIGNS: Temp:  [97.8 F (36.6 C)-98.9 F (37.2 C)] 98.9 F (37.2 C) (08/12 0755) Pulse Rate:  [93-115] 99 (08/12 0822) Resp:  [15-29] 29 (08/12 0822) BP: (106-163)/(65-108) 143/86 mmHg (08/12 0822) SpO2:  [91 %-100 %] 98 % (08/12 0822) FiO2 (%):  [40 %] 40 % (08/12 0832) Weight:  [92.5 kg (203 lb 14.8 oz)-93 kg (205 lb 0.4 oz)] 92.8 kg (204 lb 9.4 oz) (08/12 0500) HEMODYNAMICS:   VENTILATOR SETTINGS: Vent Mode:  [-] PRVC FiO2 (%):  [40 %] 40 % Set Rate:   [14 bmp] 14 bmp Vt Set:  [570 mL] 570 mL PEEP:  [5 cmH20] 5 cmH20 Pressure Support:  [14 cmH20] 14 cmH20 Plateau Pressure:  [13 cmH20-14 cmH20] 13 cmH20 INTAKE / OUTPUT:  Intake/Output Summary (Last 24 hours) at 11/13/14 0951 Last data filed at 11/13/14 0700  Gross per 24 hour  Intake    940 ml  Output    500 ml  Net    440 ml    PHYSICAL EXAMINATION: General:  Chronically ill appearing, on vent Neuro:  Has weak cough to tracheal suctioning, somnlent HEENT:  NCAT, ETT in place Cardiovascular:  RRR, systolic murmur noted, left upper arm AV fistula > dressed Lungs:  Vent supported breaths, clear to auscultation except on right side occ wheeze + Abdomen:  BS+, soft, nontender Musculoskeletal:  Diminished tone on my exam Skin:  R IJ perm cath> clean, PEG > clean  LABS: PULMONARY  Recent Labs Lab 11/10/14 1225 11/10/14 1325  PHART  --  7.331*  PCO2ART  --  52.3*  PO2ART  --  409.0*  HCO3  --  27.6*  TCO2 23 29  O2SAT  --  100.0    CBC  Recent Labs Lab 11/10/14 1220 11/10/14 1225 11/11/14 0330 11/13/14 0323  HGB 12.0 15.3* 12.6 10.7*  HCT 36.9 45.0 38.9 32.6*  WBC 6.8  --  8.5 7.4  PLT 202  --  186 172  COAGULATION  Recent Labs Lab 11/10/14 1220  INR 1.29    CARDIAC  No results for input(s): TROPONINI in the last 168 hours. No results for input(s): PROBNP in the last 168 hours.   CHEMISTRY  Recent Labs Lab 11/10/14 1220 11/10/14 1225 11/11/14 0330 11/13/14 0221 11/13/14 0529  NA 135 134* 136  --  135  K 3.4* 3.3* 3.3*  --  3.8  CL 97* 97* 97*  --  98*  CO2 23  --  23  --  24  GLUCOSE 84 81 80  --  86  BUN 25* 28* 27*  --  41*  CREATININE 5.66* 5.30* 6.05*  --  6.42*  CALCIUM 8.0*  --  8.3*  --  8.7*  MG  --   --   --  1.9  --   PHOS  --   --   --   --  5.3*   Estimated Creatinine Clearance: 11.2 mL/min (by C-G formula based on Cr of 6.42).   LIVER  Recent Labs Lab 11/10/14 1220  AST 23  ALT 16  ALKPHOS 74  BILITOT 1.3*   PROT 7.5  ALBUMIN 3.0*  INR 1.29     INFECTIOUS No results for input(s): LATICACIDVEN, PROCALCITON in the last 168 hours.   ENDOCRINE CBG (last 3)   Recent Labs  11/12/14 2324 11/13/14 0400 11/13/14 0808  GLUCAP 88 79 109*         IMAGING x48h   Ct Head Wo Contrast  11/11/2014   CLINICAL DATA:  Code stroke.  Status post tPA yesterday.  EXAM: CT HEAD WITHOUT CONTRAST  TECHNIQUE: Contiguous axial images were obtained from the base of the skull through the vertex without intravenous contrast.  COMPARISON:  1 day prior  FINDINGS: Sinuses/Soft tissues: Left sphenoid sinus mucous retention cyst or polyp. Minimal mucosal thickening right sphenoid sinus. Remote left medial orbital wall trauma. Clear mastoid air cells.  Intracranial: Age advanced cerebral and cerebellar atrophy. Increased conspicuity of subacute left anterior MCA distribution infarct. No significant mass effect or midline shift. No hemorrhage, hydrocephalus, intra-axial, or extra-axial fluid collection. Bilateral vertebral artery atherosclerosis which is age advanced.  IMPRESSION: 1. No evidence of intracranial hemorrhage. 2. Expected evolution of left MCA distribution subacute infarct. 3. Sinus disease. 4. Intracranial atherosclerosis.   Electronically Signed   By: Jeronimo Greaves M.D.   On: 11/11/2014 15:14   Mr Brain Wo Contrast  11/12/2014   CLINICAL DATA:  59 year old female status post IV tPA for code stroke. Initial encounter.  EXAM: MRI HEAD WITHOUT CONTRAST  TECHNIQUE: Multiplanar, multiecho pulse sequences of the brain and surrounding structures were obtained without intravenous contrast.  COMPARISON:  Post treatment head CT 11/11/2014, and earlier. Brain MRI 06/20/2014  FINDINGS: 6 cm area of restricted diffusion in the left MCA territory affecting the insula and operculum (series 6, image 19). Associated cytotoxic edema with mild T2 and FLAIR hyperintensity. No significant mass effect. No associated hemorrhage at  this site.  Sequelae of posterior right ACA and posterior left MCA territory infarcts which were demonstrated in March. Mild petechial hemorrhage at these sites. Mild laminar necrosis.  No contralateral right hemisphere or posterior fossa restricted diffusion. Major intracranial vascular flow voids are preserved.  No ventriculomegaly. Normal basilar cisterns. Negative pituitary, cervicomedullary junction, and visualized cervical spine. Mild to moderate T2 heterogeneity in the pons. Possible tiny chronic lacunar infarct in the left cerebellum on series 5, image 7.  Mild paranasal sinus mucosal thickening. Mastoids are clear.  Orbit and scalp soft tissues appear normal. Stable visualized bone marrow signal.  IMPRESSION: 1. Moderate size left MCA territory infarct affecting the left insula and operculum. No associated hemorrhage or mass effect. 2. Expected evolution of the posterior right ACA and posterior left MCA infarcts seen in March.   Electronically Signed   By: Odessa Fleming M.D.   On: 11/12/2014 12:35   Dg Chest Port 1 View  11/13/2014   CLINICAL DATA:  Acute respiratory failure  EXAM: PORTABLE CHEST - 1 VIEW  COMPARISON:  Portable chest x-ray of November 11, 2014.  FINDINGS: The right lung remains well-expanded. The interstitial markings on the right are slightly more conspicuous. The left retrocardiac region remains dense and the hemidiaphragm obscured. There is slight improved aeration of the left upper lobe. The cardiac silhouette remains enlarged. The pulmonary vascularity is not engorged.  The endotracheal tube tip lies 5.6 cm above the carina. The dual-lumen dialysis type catheter has its proximal port at the level of the proximal SVC with the distal port lying just inferior to this.  IMPRESSION: Slight interval increased prominence of the interstitium in the right lower lung. There is stable retrocardiac atelectasis or pneumonia with trace pleural effusion on the left. The support tubes are in reasonable  position.   Electronically Signed   By: David  [place] M.D.   On: 11/13/2014 07:45        ASSESSMENT / PLAN:  PULMONARY OETT 8/9 >  A:Intubated for airway protection - Acute Reso failure due to stroke \   - not ready for extubation due to secretions and agitation P:   Full vent support VAP bundle Daily SBT/WUA Daily CXR  CARDIOVASCULAR  A: Hypertension Pulmonary hypertension by echo> cause uncertain, has not had RHC PFO History of aortic aneurysm   - no acute issue  P:  BP management post TPA per neurology Tele  RENAL A:  ESRD P:   Needs R IJ perm cath removed this admission Renal consult 8/10 - being called Reduce IVF from 75cc/h to 10cc/h  GASTROINTESTINAL A:  Recent ischemic bowel, perforation, treated with surgery in [place] 2016 No acute issues now P:   NPO for now TF at goal ongoing   HEMATOLOGIC A:  S/p tPA 11/10/2014  P:  Monitor for bleeding CBC in AM  INFECTIOUS A:  No acute issues P:   Monitor for fever  ENDOCRINE A:  Hypoglycemia   - resolved after starting TF P:   DC D10  NEUROLOGIC A:  Acute L MCA stroke Acute encephalopathy post stroke> med related from intubation? ICH?   - agitated  P:   Hold sedation now PRN sedation - fent prn If waking up, use PAD protocol with intermittent fentanyl titrated to RASS 0   FAMILY  - Updates: sister in law updated bedside 8/9. DTr updated 11/11/2014. Not at bedside 11/13/2014    - Inter-disciplinary family meet or Palliative Care meeting due by:  7; from admit 11/10/2014. Neuro hasinitated process 11/11/2014. Per nursing report daughter will likely refuse pallitiave care consult      The patient is critically ill with multiple organ systems failure and requires high complexity decision making for assessment and support, frequent evaluation and titration of therapies, application of advanced monitoring technologies and extensive interpretation of multiple databases.   Critical Care Time  devoted to patient care services described in this note is  30  Minutes. This time reflects time of care of this signee Dr Kalman Shan. This critical care  time does not reflect procedure time, or teaching time or supervisory time of PA/NP/Med student/Med Resident etc but could involve care discussion time    Dr. Kalman Shan, M.D., [facility]C.P Pulmonary and Critical Care Medicine Staff Physician [facility] Pager: [phone], If no answer or between  15:00h - 7:00h: call [phone]  11/13/2014 9:51 AM

## 2014-11-13 NOTE — Progress Notes (Signed)
STROKE TEAM PROGRESS NOTE   HISTORY [name] is an 59 y.o. female who was at dialysis and finished her session. After dialysis while still at the center she was noted to have sudden onset right sided weakness, left gaze deviation (LKW 11/10/2014 at 1106). EMS was called and on site BG was noted to be 50. Patient was given D50 and was transported to [facility] ED. On arrival she had labored respirations was not protecting her airway, and required intubation. CT of head was delayed due to need for intubation. CT head obtained and showed no acte stroke or bleed. Patient was deemed to be a candidate for TPA and was brought back to the ED. Dialysis needles were still present in the patient's AV fistula. IV team was assaulted and removed the hemodialysis needles as well as achieved hemostasis. IV tPA was then administered. CT angiogram of the head and neck was obtained which showed no large vessel proximal occlusion. She was subsequently admitted to the neuro intensive care unit for further management.    SUBJECTIVE (INTERVAL HISTORY) Her family is at the bedside. She is breathing over the vent without difficulty.  . Remains aphasic with right hemiparesis but moving the right side slightly better today. MRI not done yet.EEG no seizures. LE venous dopplers negative for DVT. MRI brain confirms left frontal MCA branch infarct. Old right anterior cerebral artery infarct of remote age  OBJECTIVE Temp:  [25.8 F (36.6 C)-99.1 F (37.3 C)] 99.1 F (37.3 C) (08/12 1100) Pulse Rate:  [93-106] 93 (08/12 1139) Cardiac Rhythm:  [-] Normal sinus rhythm (08/12 0800) Resp:  [17-29] 18 (08/12 1139) BP: (106-144)/(65-87) 124/73 mmHg (08/12 1139) SpO2:  [97 %-100 %] 100 % (08/12 1139) FiO2 (%):  [40 %] 40 % (08/12 1139) Weight:  [203 lb 14.8 oz (92.5 kg)-204 lb 9.4 oz (92.8 kg)] 204 lb 9.4 oz (92.8 kg) (08/12 0500)   Recent Labs Lab 11/12/14 1939 11/12/14 2324 11/13/14 0400 11/13/14 0808 11/13/14 1117  GLUCAP  84 88 79 109* 85    Recent Labs Lab 11/10/14 1220 11/10/14 1225 11/11/14 0330 11/13/14 0221 11/13/14 0529  NA 135 134* 136  --  135  K 3.4* 3.3* 3.3*  --  3.8  CL 97* 97* 97*  --  98*  CO2 23  --  23  --  24  GLUCOSE 84 81 80  --  86  BUN 25* 28* 27*  --  41*  CREATININE 5.66* 5.30* 6.05*  --  6.42*  CALCIUM 8.0*  --  8.3*  --  8.7*  MG  --   --   --  1.9  --   PHOS  --   --   --   --  5.3*    Recent Labs Lab 11/10/14 1220  AST 23  ALT 16  ALKPHOS 74  BILITOT 1.3*  PROT 7.5  ALBUMIN 3.0*    Recent Labs Lab 11/10/14 1220 11/10/14 1225 11/11/14 0330 11/13/14 0323  WBC 6.8  --  8.5 7.4  NEUTROABS 4.6  --  6.6  --   HGB 12.0 15.3* 12.6 10.7*  HCT 36.9 45.0 38.9 32.6*  MCV 90.2  --  89.8 88.3  PLT 202  --  186 172   No results for input(s): CKTOTAL, CKMB, CKMBINDEX, TROPONINI in the last 168 hours. No results for input(s): LABPROT, INR in the last 72 hours.  Recent Labs  11/10/14 1336  COLORURINE YELLOW  LABSPEC 1.013  PHURINE 5.0  GLUCOSEU NEGATIVE  HGBUR NEGATIVE  BILIRUBINUR SMALL*  KETONESUR 15*  PROTEINUR 100*  UROBILINOGEN 1.0  NITRITE NEGATIVE  LEUKOCYTESUR NEGATIVE       Component Value Date/Time   CHOL 135 11/11/2014 0330   TRIG 95 11/11/2014 0330   HDL 37* 11/11/2014 0330   CHOLHDL 3.6 11/11/2014 0330   VLDL 19 11/11/2014 0330   LDLCALC 79 11/11/2014 0330   Lab Results  Component Value Date   HGBA1C 4.8 11/11/2014      Component Value Date/Time   LABOPIA NONE DETECTED 11/10/2014 1336   COCAINSCRNUR NONE DETECTED 11/10/2014 1336   LABBENZ POSITIVE* 11/10/2014 1336   AMPHETMU NONE DETECTED 11/10/2014 1336   THCU POSITIVE* 11/10/2014 1336   LABBARB NONE DETECTED 11/10/2014 1336     Recent Labs Lab 11/10/14 1220  ETH <5     IMAGING  Ct Head Wo Contrast 11/10/2014    No evidence for post t-PA hemorrhage. Early cytotoxic edema now noted in the LEFT hemisphere.    11/10/2014   1. No acute intracranial pathology.     Ct  Angio Head & Neck W/cm &/or Wo/cm 11/10/2014   1. 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 2. No proximal large vessel occlusion. 3. Moderate irregular narrowing and attenuation of ACA branch vessels, progressed from prior MRA. 4. Mild-to-moderate plaque in the cervical carotid arteries without stenosis. 5. Mild proximal left vertebral artery stenosis.    Portable Chest Xray 11/11/2014    1. Lines and tubes in stable position. 2. New onset of dense left lower lobe atelectasis and consolidation. Small left pleural effusion.    11/10/2014   Endotracheal tube terminates 3 cm above the carina.     Dg Abd Portable 1v 11/11/2014   1. G-tube seen ending overlying the body of the stomach, with contrast filling the fundus of the stomach as expected. 2. Dense left basilar airspace opacification raises concern for pneumonia.     EEG : mild global cerebral dysfunction as seen in a wide variety of toxic metabolic encephalopathies and degenerative brain disorders. No electrographic seizures noted. 2DEcho :07/23/14 :  Left ventricle: The cavity size was normal. There was moderate concentric hypertrophy. Systolic function was normal. The estimated ejection fraction was in the range of 60% to 65%. Carotid Dopplers 06/22/2014 : The vertebral arteries appear patent with antegrade flow. - Findings consistent with 1- 39 percent stenosis involving the right internal carotid artery and the left internal carotid artery. PHYSICAL EXAM elderly African-American lady who is intubated. . Afebrile. Head is nontraumatic. Neck is supple without bruit.    Cardiac exam no murmur or gallop. Lungs are clear to auscultation. Distal pulses are well felt. Neurological Exam :  Eyes open .Aphasic. Not following commands but will track.. Left gaze preference. Unable to look to the right past midline even with doll's eye movements. Pupils irregular but reactive 3 mm. Fundi could not be visualized. Vision acuity cannot be  tested. Blinks to threat minimally on the right and partially on the left. Right lower facial weakness. Tongue midline.  Marland Kitchen Spontaneous left upper and lower extremity moments mild right-sided weakness but able to withdraw to pain. Deep tendon reflexes are depressed. Plantars are both equivocal. Sensation and coordination cannot be reliably tested. Gait was not tested. ASSESSMENT/PLAN Ms. Tanga ELGIE LANDINO is a 59 y.o. female with history of multiple medical problems including ESRD, recent stroke and recent bowel ischemia leading to resection presenting with right sided weakness, left gaze deviation. She received IV t-PA 11/10/2014 at 1340.  Stroke:  Dominant left MCA infarct embolic secondary to known atrial fibrillation   Resultant  VDRF, right hemiparesis, aphasia  MRI  pending   CTA head & neck L M2 branch occlusion. ACA atherosclerosis progressed since March. Plaque in bilateral ICAs and L VA. No proximal large vessel occlusion  2D Echo see 07/23/2014   TEE 06/23/2014 PFO. Severe RV dysfunction, RAE, TR. Incessant atrial tachycardia  Given known PFO, check LE venous dopplers  LDL 79  HgbA1c 6.5 in March  SCDs for VTE prophylaxis (listed heparin antibody positive) Diet NPO time specified  aspirin 81 mg orally every day prior to admission, now on no antithrombotic as within 24h of IV TPA. Recommend resumption of aspirin if 24h imaging negative for hemorrhage  Ongoing aggressive stroke risk factor management  Continue ICU level care  Therapy recommendations:  pending   Disposition:  pending   Paroxysmal Atrial Fibrillation  Documented on tele 07/27/2014  Decided not an anticoagulation candidate at that time as risk outweighs benefits given multiple medical issues (see note 07/28/2014)  Respiratory Failure  Intubated in ED  Recommend addressing code status prior to extubation   Possible seizure  Intermittent, arrhythmic twitching of R hand  Started on Keppra, 1 gm load  followed by 500 mg bid  EEG pending   AcceleratedHypertension Pulmonary hypertension   BP 189/118 on arrival  Improved over night  Not on BP meds as NPO  Hyperlipidemia  Home meds:  No statin  LDL 79, goal < 70  Add statin once able to swallow  Diabetes  Hypoglycemia over night w/ dextrose given  HgbA1c 6.5 in March, at goal < 7.0  Controlled  Other Stroke Risk Factors  Cigarette smoker, advised to stop smoking  ETOH use  UDS positive for benzos and THC  Obesity, Body mass index is 32.04 kg/(m^2).   Hx stroke/TIA - Right hemisphere acute infarcts involving right ACA, left PCA, and left MCA, cardioembolic pattern, pt w/ PAF during prolonged hospitalization 06/2014 at that time "Given her multitude of co morbidities (including fistula) she would not be an optimal candidate for anticoagulation. Risks outweigh benefits. Family understands risk of future stroke/ thrombus"  Known PFO  Other Active Problems  ESRD on HD T, TH, S  AAA 3 cm by CT 08/2014  Other Pertinent History  Recent admission for bowel ischemia and resection [place] 2016; palliative care heavily involved  Hospital day # 3  SETHI,PRAMOD  [facility] See Amion for Pager information 11/13/2014 12:43 PM  I have personally examined this patient, reviewed notes, independently viewed imaging studies, participated in medical decision making and plan of care. I have made any additions or clarifications directly to the above note. Agree with note above.  She has presented with aphasia and right-sided weakness due to to suspected embolic left MCA infarct. She has received IV tPA.Marland KitchenShe  remains at risk for neurological worsening,brain hemorrhage, recurrent stroke/TIAs and needs ongoing stroke evaluation and aggressive risk factor modification. Recommend strict control of blood pressure and close neurological monitoring.Check MRI brain today. I spoke with patient`s daughter and other family members  today  about goals of care and DO NOT RESUSCITATE status prior to extubation.She will think about it and let us know. D/w Dr Marchelle Gearing.Prognosis is guarded given large stroke and multiple comorbidities. This patient is critically ill and at significant risk of neurological worsening, death and care requires constant monitoring of vital signs, hemodynamics,respiratory and cardiac monitoring, extensive review of multiple databases, frequent neurological assessment, discussion  with family, other specialists and medical decision making of high complexity.I have made any additions or clarifications directly to the above note.This critical care time does not reflect procedure time, or teaching time or supervisory time of PA/NP/Med Resident etc but could involve care discussion time.  I spent 32 minutes of neurocritical care time  in the care of  this patient.  Delia Heady, MD Medical Director [facility] Pager: [phone] 11/13/2014 12:43 PM   To contact Stroke Continuity provider, please refer to [email or web address]. After hours, contact General Neurology

## 2014-11-13 NOTE — Progress Notes (Signed)
SLP Cancellation Note  Patient Details Name: [name] MRN: [id number] DOB: 11-20-1955   Cancelled treatment:       Reason Eval/Treat Not Completed: Medical issues which prohibited therapy - remains intubated; difficult wean.  Will sign off and await new orders when indicated.     Blenda Mounts Laurice 11/13/2014, 11:13 AM

## 2014-11-14 LAB — GLUCOSE, CAPILLARY
GLUCOSE-CAPILLARY: 73 mg/dL (ref 65–99)
GLUCOSE-CAPILLARY: 88 mg/dL (ref 65–99)
GLUCOSE-CAPILLARY: 94 mg/dL (ref 65–99)
Glucose-Capillary: 95 mg/dL (ref 65–99)
Glucose-Capillary: 105 mg/dL — ABNORMAL HIGH (ref 65–99)

## 2014-11-14 LAB — RENAL FUNCTION PANEL
Albumin: 2.1 g/dL — ABNORMAL LOW (ref 3.5–5.0)
Anion gap: 10 (ref 5–15)
BUN: 68 mg/dL — ABNORMAL HIGH (ref 6–20)
CO2: 23 mmol/L (ref 22–32)
Calcium: 8.6 mg/dL — ABNORMAL LOW (ref 8.9–10.3)
Chloride: 101 mmol/L (ref 101–111)
Creatinine, Ser: 7.42 mg/dL — ABNORMAL HIGH (ref 0.44–1.00)
GFR calc Af Amer: 6 mL/min — ABNORMAL LOW
GFR calc non Af Amer: 5 mL/min — ABNORMAL LOW
Glucose, Bld: 101 mg/dL — ABNORMAL HIGH (ref 65–99)
Phosphorus: 5.9 mg/dL — ABNORMAL HIGH (ref 2.5–4.6)
Potassium: 5.2 mmol/L — ABNORMAL HIGH (ref 3.5–5.1)
Sodium: 134 mmol/L — ABNORMAL LOW (ref 135–145)

## 2014-11-14 MED ORDER — ALTEPLASE 2 MG IJ SOLR: 2.0000 mg | Freq: Once | INTRAMUSCULAR | Status: DC | PRN

## 2014-11-14 MED ORDER — SODIUM CHLORIDE 0.9 % IV SOLN: 100.0000 mL | INTRAVENOUS | Status: DC | PRN

## 2014-11-14 MED ORDER — FAMOTIDINE 40 MG/5ML PO SUSR
20.0000 mg | Freq: Every day | ORAL | Status: AC
  Administered 2014-11-15 – 2014-11-20 (×6): 20 mg
  Filled 2014-11-14 (×6): qty 2.5

## 2014-11-14 MED ORDER — PENTAFLUOROPROP-TETRAFLUOROETH EX AERO: 1.0000 "application " | INHALATION_SPRAY | CUTANEOUS | Status: DC | PRN

## 2014-11-14 MED ORDER — LIDOCAINE HCL (PF) 1 % IJ SOLN: 5.0000 mL | INTRAMUSCULAR | Status: DC | PRN

## 2014-11-14 MED ORDER — LIDOCAINE-PRILOCAINE 2.5-2.5 % EX CREA: 1.0000 "application " | TOPICAL_CREAM | CUTANEOUS | Status: DC | PRN

## 2014-11-14 MED ORDER — NEPRO/CARBSTEADY PO LIQD: 237.0000 mL | ORAL | Status: DC | PRN

## 2014-11-14 MED ORDER — FAMOTIDINE 40 MG/5ML PO SUSR: 20.0000 mg | Freq: Two times a day (BID) | ORAL | Status: DC

## 2014-11-14 NOTE — Progress Notes (Signed)
Attempted to wean pt on SBT, pt became very agitated.  Increased PS up to 20, pt still agitated w/ increased RR.  Changed pt back to full vent support, RN aware.

## 2014-11-14 NOTE — Progress Notes (Signed)
Pt very tachypnic RR =60's increased WOB, increased HR  when RN cleaned turned pt. Placed pt back on full vent support.  RN aware.

## 2014-11-14 NOTE — Progress Notes (Signed)
PULMONARY / CRITICAL CARE MEDICINE   Name: [name] MRN: [id number] DOB: [birth year]    ADMISSION DATE:  11/10/2014 CONSULTATION DATE:  11/10/2014  REFERRING MD :  Pearlean Brownie  CHIEF COMPLAINT:  Stroke, post TPA  INITIAL PRESENTATION:  This is a 59 y/o female who had an acute left MCA stroke today and received IV TPA.  She was intubated for airway protection in the ER.  She has a long history of ESRD, a recent R ACA stroke, and a recent history of ischemic bowel requiring resection.  Apparently today at dialysis she developed sudden onset of R sided weakness.  She was brought to the ER for further evaluation.  No further history could be obtained due to intubation.  Of note, after TPA she had bleeding from her AV fistula   STUDIES:  8/9 CT head > no acute abnormality 8/9 CT angiogram post tpa> L MCA m2 superior division branch vessel occlusion, moderate irregular narrowing and attenuation of ACA branch vessels, progressed from prior MRA 8/9 admission, IV TPA for L MCA stroke  11/11/14: CT head pending. Neuro Dr Pearlean Brownie discussing goals with dtr. Dtr denied any questions to me. On prn sedation - receieved none - followed some command with delayed response but somnolent  11/13/2014: 11/12/14 Moderate MCAon MRI yesterday.  Still on vent. Family not at bedside. No change noted other than agitation overnight with same during SBT today. Lot of resp sexcretions. RN says dtr - very reluctant for DNR    SUBJECTIVE/OVERNIGHT/INTERVAL HX 11/14/14: Dr Pearlean Brownie goals of care - full code. Remains aphasic with right hemiparesis. LE duplex negative for DVT. On HD.   VITAL SIGNS: Temp:  [98.6 F (37 C)-99.2 F (37.3 C)] 99.2 F (37.3 C) (08/13 0735) Pulse Rate:  [91-103] 96 (08/13 0830) Resp:  [16-29] 25 (08/13 0830) BP: (105-176)/(66-91) 136/78 mmHg (08/13 0830) SpO2:  [95 %-100 %] 99 % (08/13 0830) FiO2 (%):  [40 %] 40 % (08/13 0735) Weight:  [93.4 kg (205 lb 14.6 oz)-94.8 kg (208 lb 15.9 oz)] 93.4 kg  (205 lb 14.6 oz) (08/13 0735) HEMODYNAMICS:   VENTILATOR SETTINGS: Vent Mode:  [-] PRVC FiO2 (%):  [40 %] 40 % Set Rate:  [14 bmp] 14 bmp Vt Set:  [570 mL] 570 mL PEEP:  [5 cmH20] 5 cmH20 Plateau Pressure:  [16 cmH20-18 cmH20] 16 cmH20 INTAKE / OUTPUT:  Intake/Output Summary (Last 24 hours) at 11/14/14 0856 Last data filed at 11/14/14 0700  Gross per 24 hour  Intake   1020 ml  Output      0 ml  Net   1020 ml    PHYSICAL EXAMINATION: General:  Chronically ill appearing, on vent Neuro:  Has weak cough to tracheal suctioning, more awake  HEENT:  NCAT, ETT in place Cardiovascular:  RRR, systolic murmur noted, left upper arm AV fistula > dressed Lungs:  Vent supported breaths, bilat  wheeze + Abdomen:  BS+, soft, nontender Musculoskeletal:  Diminished tone on my exam Skin:  R IJ perm cath> clean, PEG > clean  LABS: PULMONARY  Recent Labs Lab 11/10/14 1225 11/10/14 1325  PHART  --  7.331*  PCO2ART  --  52.3*  PO2ART  --  409.0*  HCO3  --  27.6*  TCO2 23 29  O2SAT  --  100.0    CBC  Recent Labs Lab 11/10/14 1220 11/10/14 1225 11/11/14 0330 11/13/14 0323  HGB 12.0 15.3* 12.6 10.7*  HCT 36.9 45.0 38.9 32.6*  WBC 6.8  --  8.5 7.4  PLT 202  --  186 172    COAGULATION  Recent Labs Lab 11/10/14 1220  INR 1.29    CARDIAC  No results for input(s): TROPONINI in the last 168 hours. No results for input(s): PROBNP in the last 168 hours.   CHEMISTRY  Recent Labs Lab 11/10/14 1220 11/10/14 1225 11/11/14 0330 11/13/14 0221 11/13/14 0529  NA 135 134* 136  --  135  K 3.4* 3.3* 3.3*  --  3.8  CL 97* 97* 97*  --  98*  CO2 23  --  23  --  24  GLUCOSE 84 81 80  --  86  BUN 25* 28* 27*  --  41*  CREATININE 5.66* 5.30* 6.05*  --  6.42*  CALCIUM 8.0*  --  8.3*  --  8.7*  MG  --   --   --  1.9  --   PHOS  --   --   --   --  5.3*   Estimated Creatinine Clearance: 11.2 mL/min (by C-G formula based on Cr of 6.42).   LIVER  Recent Labs Lab 11/10/14 1220   AST 23  ALT 16  ALKPHOS 74  BILITOT 1.3*  PROT 7.5  ALBUMIN 3.0*  INR 1.29     INFECTIOUS No results for input(s): LATICACIDVEN, PROCALCITON in the last 168 hours.   ENDOCRINE CBG (last 3)   Recent Labs  11/13/14 1948 11/13/14 2335 11/14/14 0350  GLUCAP 72 81 73         IMAGING x48h   Mr Brain Wo Contrast  11/12/2014   CLINICAL DATA:  59 year old female status post IV tPA for code stroke. Initial encounter.  EXAM: MRI HEAD WITHOUT CONTRAST  TECHNIQUE: Multiplanar, multiecho pulse sequences of the brain and surrounding structures were obtained without intravenous contrast.  COMPARISON:  Post treatment head CT 11/11/2014, and earlier. Brain MRI 06/20/2014  FINDINGS: 6 cm area of restricted diffusion in the left MCA territory affecting the insula and operculum (series 6, image 19). Associated cytotoxic edema with mild T2 and FLAIR hyperintensity. No significant mass effect. No associated hemorrhage at this site.  Sequelae of posterior right ACA and posterior left MCA territory infarcts which were demonstrated in March. Mild petechial hemorrhage at these sites. Mild laminar necrosis.  No contralateral right hemisphere or posterior fossa restricted diffusion. Major intracranial vascular flow voids are preserved.  No ventriculomegaly. Normal basilar cisterns. Negative pituitary, cervicomedullary junction, and visualized cervical spine. Mild to moderate T2 heterogeneity in the pons. Possible tiny chronic lacunar infarct in the left cerebellum on series 5, image 7.  Mild paranasal sinus mucosal thickening. Mastoids are clear. Orbit and scalp soft tissues appear normal. Stable visualized bone marrow signal.  IMPRESSION: 1. Moderate size left MCA territory infarct affecting the left insula and operculum. No associated hemorrhage or mass effect. 2. Expected evolution of the posterior right ACA and posterior left MCA infarcts seen in March.   Electronically Signed   By: Odessa Fleming M.D.   On:  11/12/2014 12:35   Dg Chest Port 1 View  11/13/2014   CLINICAL DATA:  Acute respiratory failure  EXAM: PORTABLE CHEST - 1 VIEW  COMPARISON:  Portable chest x-ray of November 11, 2014.  FINDINGS: The right lung remains well-expanded. The interstitial markings on the right are slightly more conspicuous. The left retrocardiac region remains dense and the hemidiaphragm obscured. There is slight improved aeration of the left upper lobe. The cardiac silhouette remains enlarged. The pulmonary vascularity is  not engorged.  The endotracheal tube tip lies 5.6 cm above the carina. The dual-lumen dialysis type catheter has its proximal port at the level of the proximal SVC with the distal port lying just inferior to this.  IMPRESSION: Slight interval increased prominence of the interstitium in the right lower lung. There is stable retrocardiac atelectasis or pneumonia with trace pleural effusion on the left. The support tubes are in reasonable position.   Electronically Signed   By: David  [place] M.D.   On: 11/13/2014 07:45        ASSESSMENT / PLAN:  PULMONARY OETT 8/9 >  A:Intubated for airway protection - Acute Reso failure due to stroke \   - not ready for extubation due to secretions and agitation and aphasia P:   Full vent support VAP bundle Daily SBT/WUA Daily CXR  CARDIOVASCULAR  A: Hypertension Pulmonary hypertension by echo> cause uncertain, has not had RHC PFO History of aortic aneurysm   - no acute issue  P:  BP management post TPA per neurology Tele  RENAL A:  ESRD P:   Needs R IJ perm cath removed this admission - renal to advice Renal consult 8/10 - being called Reduce IVF from 75cc/h to 10cc/h  GASTROINTESTINAL A:  Recent ischemic bowel, perforation, treated with surgery in [place] 2016 No acute issues now P:   NPO for now TF at goal ongoing   HEMATOLOGIC A:  S/p tPA 11/10/2014  P:  Monitor for bleeding CBC in AM  INFECTIOUS A:  No acute issues P:   Monitor  for fever  ENDOCRINE A:  Hypoglycemia   - resolved after starting TF P:   DC D10  NEUROLOGIC A:  Acute L MCA stroke Acute encephalopathy post stroke> med related from intubation? ICH?   - agitated and aphasic  P:   Hold sedation now PRN sedation - fent prn If waking up, use PAD protocol with intermittent fentanyl titrated to RASS 0   FAMILY  - Updates: sister in law updated bedside 8/9. DTr updated 11/11/2014. Not at bedside 11/13/2014    - Inter-disciplinary family meet or Palliative Care meeting due by:  7; from admit 11/10/2014. Neuro has initated process 11/11/2014.  Full Code 11/14/2014      The patient is critically ill with multiple organ systems failure and requires high complexity decision making for assessment and support, frequent evaluation and titration of therapies, application of advanced monitoring technologies and extensive interpretation of multiple databases.   Critical Care Time devoted to patient care services described in this note is  30  Minutes. This time reflects time of care of this signee Dr Kalman Shan. This critical care time does not reflect procedure time, or teaching time or supervisory time of PA/NP/Med student/Med Resident etc but could involve care discussion time    Dr. Kalman Shan, M.D., [facility].C.P Pulmonary and Critical Care Medicine Staff Physician [facility] [place] [facility] Pager: [phone], If no answer or between  15:00h - 7:00h: call [phone]  11/14/2014 8:56 AM

## 2014-11-14 NOTE — Progress Notes (Signed)
STROKE TEAM PROGRESS NOTE   HISTORY [name] is an 59 y.o. female who was at dialysis and finished her session. After dialysis while still at the center she was noted to have sudden onset right sided weakness, left gaze deviation (LKW 11/10/2014 at 1106). EMS was called and on site BG was noted to be 50. Patient was given D50 and was transported to [facility] ED. On arrival she had labored respirations was not protecting her airway, and required intubation. CT of head was delayed due to need for intubation. CT head obtained and showed no acte stroke or bleed. Patient was deemed to be a candidate for TPA and was brought back to the ED. Dialysis needles were still present in the patient's AV fistula. IV team was assaulted and removed the hemodialysis needles as well as achieved hemostasis. IV tPA was then administered. CT angiogram of the head and neck was obtained which showed no large vessel proximal occlusion. She was subsequently admitted to the neuro intensive care unit for further management.    SUBJECTIVE (INTERVAL HISTORY) No changes. Intutbated.   OBJECTIVE Temp:  [98.6 F (37 C)-99.2 F (37.3 C)] 99.2 F (37.3 C) (08/13 0735) Pulse Rate:  [91-103] 96 (08/13 0830) Cardiac Rhythm:  [-] Normal sinus rhythm (08/12 2000) Resp:  [16-29] 25 (08/13 0830) BP: (105-176)/(66-91) 136/78 mmHg (08/13 0830) SpO2:  [95 %-100 %] 99 % (08/13 0830) FiO2 (%):  [40 %] 40 % (08/13 0735) Weight:  [93.4 kg (205 lb 14.6 oz)-94.8 kg (208 lb 15.9 oz)] 93.4 kg (205 lb 14.6 oz) (08/13 0735)   Recent Labs Lab 11/13/14 1117 11/13/14 1548 11/13/14 1948 11/13/14 2335 11/14/14 0350  GLUCAP 85 104* 72 81 73    Recent Labs Lab 11/10/14 1220 11/10/14 1225 11/11/14 0330 11/13/14 0221 11/13/14 0529  NA 135 134* 136  --  135  K 3.4* 3.3* 3.3*  --  3.8  CL 97* 97* 97*  --  98*  CO2 23  --  23  --  24  GLUCOSE 84 81 80  --  86  BUN 25* 28* 27*  --  41*  CREATININE 5.66* 5.30* 6.05*  --  6.42*  CALCIUM  8.0*  --  8.3*  --  8.7*  MG  --   --   --  1.9  --   PHOS  --   --   --   --  5.3*    Recent Labs Lab 11/10/14 1220  AST 23  ALT 16  ALKPHOS 74  BILITOT 1.3*  PROT 7.5  ALBUMIN 3.0*    Recent Labs Lab 11/10/14 1220 11/10/14 1225 11/11/14 0330 11/13/14 0323  WBC 6.8  --  8.5 7.4  NEUTROABS 4.6  --  6.6  --   HGB 12.0 15.3* 12.6 10.7*  HCT 36.9 45.0 38.9 32.6*  MCV 90.2  --  89.8 88.3  PLT 202  --  186 172   No results for input(s): CKTOTAL, CKMB, CKMBINDEX, TROPONINI in the last 168 hours. No results for input(s): LABPROT, INR in the last 72 hours. No results for input(s): COLORURINE, LABSPEC, PHURINE, GLUCOSEU, HGBUR, BILIRUBINUR, KETONESUR, PROTEINUR, UROBILINOGEN, NITRITE, LEUKOCYTESUR in the last 72 hours.  Invalid input(s): APPERANCEUR     Component Value Date/Time   CHOL 135 11/11/2014 0330   TRIG 95 11/11/2014 0330   HDL 37* 11/11/2014 0330   CHOLHDL 3.6 11/11/2014 0330   VLDL 19 11/11/2014 0330   LDLCALC 79 11/11/2014 0330   Lab Results  Component Value Date   HGBA1C 4.8 11/11/2014      Component Value Date/Time   LABOPIA NONE DETECTED 11/10/2014 1336   COCAINSCRNUR NONE DETECTED 11/10/2014 1336   LABBENZ POSITIVE* 11/10/2014 1336   AMPHETMU NONE DETECTED 11/10/2014 1336   THCU POSITIVE* 11/10/2014 1336   LABBARB NONE DETECTED 11/10/2014 1336     Recent Labs Lab 11/10/14 1220  ETH <5     IMAGING    MRI Brain without contrast 11/12/2014 1. Moderate size left MCA territory infarct affecting the left insula and operculum. No associated hemorrhage or mass effect. 2. Expected evolution of the posterior right ACA and posterior left MCA infarcts seen in March.   Ct Head Wo Contrast 11/10/2014    No evidence for post t-PA hemorrhage. Early cytotoxic edema now noted in the LEFT hemisphere.    11/10/2014   1. No acute intracranial pathology.     Ct Angio Head & Neck W/cm &/or Wo/cm 11/10/2014    1. Left MCA M2 superior division branch vessel  occlusion.  2. No proximal large vessel occlusion.  3. Moderate irregular narrowing and attenuation of ACA branch vessels, progressed from prior MRA.  4. Mild-to-moderate plaque in the cervical carotid arteries without stenosis.  5. Mild proximal left vertebral artery stenosis.     Portable Chest Xray 11/11/2014    1. Lines and tubes in stable position. 2. New onset of dense left lower lobe atelectasis and consolidation. Small left pleural effusion.    11/10/2014   Endotracheal tube terminates 3 cm above the carina.     Dg Abd Portable 1v 11/11/2014    1. G-tube seen ending overlying the body of the stomach, with contrast filling the fundus of the stomach as expected.  2. Dense left basilar airspace opacification raises concern for pneumonia.     EEG : mild global cerebral dysfunction as seen in a wide variety of toxic metabolic encephalopathies and degenerative brain disorders. No electrographic seizures noted.  2DEcho :07/23/14 :  Left ventricle: The cavity size was normal. There was moderate concentric hypertrophy. Systolic function was normal. The estimated ejection fraction was in the range of 60% to 65%.  Carotid Dopplers 06/22/2014 : The vertebral arteries appear patent with antegrade flow. - Findings consistent with 1- 39 percent stenosis involving the right internal carotid artery and the left internal carotid artery.   PHYSICAL EXAM elderly African-American lady who is intubated. . Afebrile. Head is nontraumatic. Neck is supple without bruit.    Cardiac exam no murmur or gallop. Lungs are clear to auscultation. Distal pulses are well felt. Neurological Exam :  Eyes open .Aphasic. Alert. Focuses but does not following commands but will track. Left gaze preference. Unable to look to the right past midline even with doll's eye movements. Pupils regular round but reactive 5 mm. Fundi could not be visualized. Vision acuity cannot be tested. Blinks to threat minimally on the  right and partially on the left. Right lower facial weakness. Tongue midline.  Marland Kitchen Spontaneous left upper and lower extremity moments; right-sided weakness but able to withdraw to pain. Deep tendon reflexes are depressed. Plantars are both equivocal. Sensation and coordination cannot be reliably tested. Gait was not tested. ASSESSMENT/PLAN [name] is a 59 y.o. female with history of multiple medical problems including ESRD, recent stroke and recent bowel ischemia leading to resection presenting with right sided weakness, left gaze deviation. She received IV t-PA 11/10/2014 at 1340.   Stroke:  Dominant left MCA infarct embolic secondary  to known atrial fibrillation   Resultant  VDRF, right hemiparesis, aphasia  MRI  Moderate size left MCA territory infarct affecting the left insula and operculum.  CTA head & neck L M2 branch occlusion. ACA atherosclerosis progressed since March. Plaque in bilateral ICAs and L VA. No proximal large vessel occlusion  2D Echo see 07/23/2014   TEE 06/23/2014 PFO. Severe RV dysfunction, RAE, TR. Incessant atrial tachycardia  Given known PFO, check LE venous dopplers  LDL 79  HgbA1c 6.5 in March  SCDs for VTE prophylaxis (listed heparin antibody positive) Diet NPO time specified  aspirin 81 mg orally every day prior to admission, now on no antithrombotic as within 24h of IV TPA. Recommend resumption of aspirin if 24h imaging negative for hemorrhage  Ongoing aggressive stroke risk factor management  Continue ICU level care  Therapy recommendations:  pending   Disposition:  pending   Paroxysmal Atrial Fibrillation  Documented on tele 07/27/2014  Decided not an anticoagulation candidate at that time as risk outweighs benefits given multiple medical issues (see note 07/28/2014)  Respiratory Failure  Intubated in ED  Recommend addressing code status prior to extubation   Possible seizure  Intermittent, arrhythmic twitching of R hand  Started  on Keppra, 1 gm load followed by 500 mg bid  EEG pending   AcceleratedHypertension Pulmonary hypertension   BP 189/118 on arrival  Improved over night  Not on BP meds as NPO  Hyperlipidemia  Home meds:  No statin  LDL 79, goal < 70  Add statin once able to swallow  Diabetes  Hypoglycemia over night w/ dextrose given  HgbA1c 6.5 in March, at goal < 7.0  Controlled  Other Stroke Risk Factors  Cigarette smoker, advised to stop smoking  ETOH use  UDS positive for benzos and THC  Obesity, Body mass index is 32.24 kg/(m^2).   Hx stroke/TIA - Right hemisphere acute infarcts involving right ACA, left PCA, and left MCA, cardioembolic pattern, pt w/ PAF during prolonged hospitalization 06/2014 at that time "Given her multitude of co morbidities (including fistula) she would not be an optimal candidate for anticoagulation. Risks outweigh benefits. Family understands risk of future stroke/ thrombus"  Known PFO  Other Active Problems  ESRD on HD T, TH, S  AAA 3 cm by CT 08/2014  Other Pertinent History  Recent admission for bowel ischemia and resection [place] 2016; palliative care heavily involved  Hospital day # 4  RINEHULS, DAVID Wallace [facility] See Amion for Pager information 11/14/2014 8:38 AM    I have personally examined this patient, reviewed notes, independently viewed imaging studies, participated in medical decision making and plan of care. I have made any additions or clarifications directly to the above note. Agree with note above.  This patient is critically ill and at significant risk of neurological worsening, death and care requires constant monitoring of vital signs, hemodynamics,respiratory and cardiac monitoring, extensive review of multiple databases, frequent neurological assessment, discussion with family, other specialists and medical decision making of high complexity. I have made any additions or clarifications directly to the above  note.This critical care time does not reflect procedure time, or teaching time or supervisory time of PA/NP/Med Resident etc but could involve care discussion time. I spent 32 minutes of neurocritical care time  in the care of  this patient.    To contact Stroke Continuity provider, please refer to [email or web address]. After hours, contact General Neurology

## 2014-11-14 NOTE — Progress Notes (Signed)
Pt is stable at this time, family/daughter at bedside. RN aware, RT will continue to monitor

## 2014-11-14 NOTE — Progress Notes (Signed)
[facility] Progress Note   Subjective: doing a little better from neuro standpoint  Filed Vitals:   11/14/14 0830 11/14/14 0900 11/14/14 0909 11/14/14 0930  BP: 136/78 158/93  132/77  Pulse: 96 98  97  Temp:      TempSrc:      Resp: 25 26  25   Height:      Weight:      SpO2: 99% 99% 97% 99%   Exam: Opens eyes to voice , not following commands NO jvd Chest clear bilat no rales or wheezes RRR no MRG ABd obese, soft ntnd LUQ PEG tube in place, dec'd BS GU deferred Ext 1+ upper leg edema bilat Neuro opens eyes, moves L arm some LUA AVF +bruit  TTS [place] 4h 2/2 [place] 91kg Heparin none Hect 2 ug Aranesp 200 ug/wk + venofer 50/wk  Assessment: 1. Acute CVA / R hemiparesis - per neuro has embolic stroke in several regions (R ACA/ L PCA/ L MCA) likely due to afib.  2. CVA w left hemiparesis (March '16) - left side weakness had mostly resolved per family 3. VDRF 4. ESRD on HD TTS 5. Volume - is 2-3 kg up 6. BP - was on midodrine, holding as BP's are in normal range 7. Hx ruptured ulcer / PEG tube - was not using PEG at home, was getting ready to have it dc'd 8. HD access - using AVF; remove TDC if she improves and when off the ventilator 9. Afib on amio, was not on coumadin due to comorbidities and risk of complications   Plan - HD today in ICU. We may want to reconsider issue of anticoagulation for afib as most of her acute problems from the last admission had resolved by the time of this admission (EC fistula, malnutrition/ PEG, etc). She may be an acceptable candidate for anticoagulation at this time.       [name] MD  pager [phone]    cell [phone]  11/14/2014, 10:05 AM     Recent Labs Lab 11/11/14 0330 11/13/14 0529 11/14/14 0820  NA 136 135 134*  K 3.3* 3.8 5.2*  CL 97* 98* 101  CO2 23 24 23   GLUCOSE 80 86 101*  BUN 27* 41* 68*  CREATININE 6.05* 6.42* 7.42*  CALCIUM 8.3* 8.7* 8.6*  PHOS  --  5.3* 5.9*    Recent  Labs Lab 11/10/14 1220 11/14/14 0820  AST 23  --   ALT 16  --   ALKPHOS 74  --   BILITOT 1.3*  --   PROT 7.5  --   ALBUMIN 3.0* 2.1*    Recent Labs Lab 11/10/14 1220 11/10/14 1225 11/11/14 0330 11/13/14 0323  WBC 6.8  --  8.5 7.4  NEUTROABS 4.6  --  6.6  --   HGB 12.0 15.3* 12.6 10.7*  HCT 36.9 45.0 38.9 32.6*  MCV 90.2  --  89.8 88.3  PLT 202  --  186 172   . antiseptic oral rinse  7 mL Mouth Rinse QID  . aspirin  300 mg Rectal Daily  . budesonide  0.25 mg Nebulization BID  . chlorhexidine gluconate  15 mL Mouth Rinse BID  . famotidine (PEPCID) IV  20 mg Intravenous Q12H  . feeding supplement (PRO-STAT SUGAR FREE 64)  30 mL Per Tube 5 X Daily  . feeding supplement (VITAL HIGH PROTEIN)  1,000 mL Per Tube Q24H  . ipratropium  0.5 mg Nebulization Q6H  . levalbuterol  0.63 mg Nebulization  Q6H  . levETIRAcetam  500 mg Per Tube BID  . levothyroxine  25 mcg Oral QAC breakfast  . pantoprazole (PROTONIX) IV  40 mg Intravenous QHS   . sodium chloride 10 mL/hr at 11/14/14 0700   sodium chloride, sodium chloride, acetaminophen **OR** acetaminophen, albuterol, alteplase, feeding supplement (NEPRO CARB STEADY), fentaNYL (SUBLIMAZE) injection, fentaNYL (SUBLIMAZE) injection, iohexol, lidocaine (PF), lidocaine-prilocaine, pentafluoroprop-tetrafluoroeth, senna-docusate

## 2014-11-15 ENCOUNTER — Inpatient Hospital Stay (HOSPITAL_COMMUNITY): Payer: Medicaid Other

## 2014-11-15 LAB — GLUCOSE, CAPILLARY
GLUCOSE-CAPILLARY: 107 mg/dL — AB (ref 65–99)
GLUCOSE-CAPILLARY: 102 mg/dL — AB (ref 65–99)
Glucose-Capillary: 106 mg/dL — ABNORMAL HIGH (ref 65–99)
Glucose-Capillary: 78 mg/dL (ref 65–99)
Glucose-Capillary: 91 mg/dL (ref 65–99)
Glucose-Capillary: 93 mg/dL (ref 65–99)
Glucose-Capillary: 99 mg/dL (ref 65–99)

## 2014-11-15 NOTE — Progress Notes (Signed)
[facility] Progress Note   Subjective: opens eyes to voice. 3kg off w HD yesterday and stable BP's.   Filed Vitals:   11/15/14 0810 11/15/14 0811 11/15/14 0900 11/15/14 1000  BP:   126/80 113/70  Pulse: 96  96 95  Temp:      TempSrc:      Resp: 28  27 17   Height:      Weight:      SpO2: 98% 98% 97% 98%   Exam: Opens eyes to voice , not following commands NO jvd Chest clear bilat no rales or wheezes RRR no MRG ABd obese, soft ntnd LUQ PEG tube in place, dec'd BS GU deferred Ext 1+ upper leg edema bilat Neuro opens eyes, moves L arm some LUA AVF +bruit  TTS [place] 4h 2/2 [place] 91kg Heparin none Hect 2 ug Aranesp 200 ug/wk + venofer 50/wk  Assessment: 1. Acute CVA / R hemiparesis - per neuro has embolic stroke in several regions (R ACA/ L PCA/ L MCA) likely due to afib.  2. CVA w left hemiparesis (March '16) - left side weakness had mostly resolved per family 3. VDRF 4. ESRD on HD TTS 5. Volume - is just below dry wt today 6. BP - was on midodrine, holding as BP's are in normal range 7. Hx ruptured ulcer / PEG tube - was not using PEG at home, was getting ready to have it dc'd 8. HD access - using AVF; remove TDC if she improves and when off the ventilator 9. Afib on amio, was not on coumadin due to comorbidities and risk of complications   Plan - cont HD TTS     [name] MD  pager [phone]    cell [phone]  11/15/2014, 11:16 AM     Recent Labs Lab 11/11/14 0330 11/13/14 0529 11/14/14 0820  NA 136 135 134*  K 3.3* 3.8 5.2*  CL 97* 98* 101  CO2 23 24 23   GLUCOSE 80 86 101*  BUN 27* 41* 68*  CREATININE 6.05* 6.42* 7.42*  CALCIUM 8.3* 8.7* 8.6*  PHOS  --  5.3* 5.9*    Recent Labs Lab 11/10/14 1220 11/14/14 0820  AST 23  --   ALT 16  --   ALKPHOS 74  --   BILITOT 1.3*  --   PROT 7.5  --   ALBUMIN 3.0* 2.1*    Recent Labs Lab 11/10/14 1220 11/10/14 1225 11/11/14 0330 11/13/14 0323  WBC 6.8  --  8.5 7.4   NEUTROABS 4.6  --  6.6  --   HGB 12.0 15.3* 12.6 10.7*  HCT 36.9 45.0 38.9 32.6*  MCV 90.2  --  89.8 88.3  PLT 202  --  186 172   . antiseptic oral rinse  7 mL Mouth Rinse QID  . aspirin  300 mg Rectal Daily  . budesonide  0.25 mg Nebulization BID  . chlorhexidine gluconate  15 mL Mouth Rinse BID  . famotidine  20 mg Per Tube Daily  . feeding supplement (PRO-STAT SUGAR FREE 64)  30 mL Per Tube 5 X Daily  . feeding supplement (VITAL HIGH PROTEIN)  1,000 mL Per Tube Q24H  . ipratropium  0.5 mg Nebulization Q6H  . levalbuterol  0.63 mg Nebulization Q6H  . levETIRAcetam  500 mg Per Tube BID  . levothyroxine  25 mcg Oral QAC breakfast  . pantoprazole (PROTONIX) IV  40 mg Intravenous QHS   . sodium chloride 10 mL/hr at 11/14/14 0700  sodium chloride, sodium chloride, acetaminophen **OR** acetaminophen, albuterol, alteplase, feeding supplement (NEPRO CARB STEADY), fentaNYL (SUBLIMAZE) injection, fentaNYL (SUBLIMAZE) injection, iohexol, lidocaine (PF), lidocaine-prilocaine, pentafluoroprop-tetrafluoroeth, senna-docusate

## 2014-11-15 NOTE — Progress Notes (Signed)
Changed back to full vent support d/t increased RR, increased agitation on PSV. RN aware. Pt tol change well.

## 2014-11-15 NOTE — Progress Notes (Signed)
PULMONARY / CRITICAL CARE MEDICINE   Name: [name] MRN: [id number] DOB: [birth year]    ADMISSION DATE:  11/10/2014 CONSULTATION DATE:  11/10/2014  REFERRING MD :  Pearlean Brownie  CHIEF COMPLAINT:  Stroke, post TPA  INITIAL PRESENTATION:  This is a 59 y/o female who had an acute left MCA stroke today and received IV TPA.  She was intubated for airway protection in the ER.  She has a long history of ESRD, a recent R ACA stroke, and a recent history of ischemic bowel requiring resection.  Apparently today at dialysis she developed sudden onset of R sided weakness.  She was brought to the ER for further evaluation.  No further history could be obtained due to intubation.  Of note, after TPA she had bleeding from her AV fistula   STUDIES:  8/9 CT head > no acute abnormality 8/9 CT angiogram post tpa> L MCA m2 superior division branch vessel occlusion, moderate irregular narrowing and attenuation of ACA branch vessels, progressed from prior MRA 8/9 admission, IV TPA for L MCA stroke  11/11/14: CT head pending. Neuro Dr Pearlean Brownie discussing goals with dtr. Dtr denied any questions to me. On prn sedation - receieved none - followed some command with delayed response but somnolent  11/13/2014: 11/12/14 Moderate MCAon MRI yesterday.  Still on vent. Family not at bedside. No change noted other than agitation overnight with same during SBT today. Lot of resp sexcretions. RN says dtr - very reluctant for DNR  11/14/14: Dr Pearlean Brownie goals of care - full code (family not buying gloom and doom message based on prior experience). Remains aphasic with right hemiparesis. LE duplex negative for DVT. On HD.    SUBJECTIVE/OVERNIGHT/INTERVAL HX 11/15/2014 : Agitated and tachypneic within 2h of vent wean yesterday. SBT going to start today   VITAL SIGNS: Temp:  [98.4 F (36.9 C)-99.3 F (37.4 C)] 98.4 F (36.9 C) (08/14 0357) Pulse Rate:  [94-120] 98 (08/14 0700) Resp:  [17-36] 20 (08/14 0700) BP: (117-158)/(65-100)  131/73 mmHg (08/14 0700) SpO2:  [95 %-100 %] 98 % (08/14 0700) FiO2 (%):  [40 %] 40 % (08/14 0332) Weight:  [90.3 kg (199 lb 1.2 oz)] 90.3 kg (199 lb 1.2 oz) (08/13 1130) HEMODYNAMICS:   VENTILATOR SETTINGS: Vent Mode:  [-] PRVC FiO2 (%):  [40 %] 40 % Set Rate:  [14 bmp] 14 bmp Vt Set:  [570 mL] 570 mL PEEP:  [5 cmH20] 5 cmH20 Pressure Support:  [10 cmH20] 10 cmH20 Plateau Pressure:  [18 cmH20-19 cmH20] 19 cmH20 INTAKE / OUTPUT:  Intake/Output Summary (Last 24 hours) at 11/15/14 0746 Last data filed at 11/15/14 0700  Gross per 24 hour  Intake   1010 ml  Output   3100 ml  Net  -2090 ml    PHYSICAL EXAMINATION: General:  Chronically ill appearing, on vent Neuro:  Has weak cough to tracheal suctioning,  awake . Aphasic per neuro HEENT:  NCAT, ETT in place Cardiovascular:  RRR, systolic murmur noted, left upper arm AV fistula > dressed Lungs:  Vent supported breaths, bilat  Coarse breath sounds, junky sounding, Coughs Abdomen:  BS+, soft, nontender Musculoskeletal:  Diminished tone on my exam Skin:  R IJ perm cath> clean, PEG > clean  LABS: PULMONARY  Recent Labs Lab 11/10/14 1225 11/10/14 1325  PHART  --  7.331*  PCO2ART  --  52.3*  PO2ART  --  409.0*  HCO3  --  27.6*  TCO2 23 29  O2SAT  --  100.0  CBC  Recent Labs Lab 11/10/14 1220 11/10/14 1225 11/11/14 0330 11/13/14 0323  HGB 12.0 15.3* 12.6 10.7*  HCT 36.9 45.0 38.9 32.6*  WBC 6.8  --  8.5 7.4  PLT 202  --  186 172    COAGULATION  Recent Labs Lab 11/10/14 1220  INR 1.29    CARDIAC  No results for input(s): TROPONINI in the last 168 hours. No results for input(s): PROBNP in the last 168 hours.   CHEMISTRY  Recent Labs Lab 11/10/14 1220 11/10/14 1225 11/11/14 0330 11/13/14 0221 11/13/14 0529 11/14/14 0820  NA 135 134* 136  --  135 134*  K 3.4* 3.3* 3.3*  --  3.8 5.2*  CL 97* 97* 97*  --  98* 101  CO2 23  --  23  --  24 23  GLUCOSE 84 81 80  --  86 101*  BUN 25* 28* 27*  --   41* 68*  CREATININE 5.66* 5.30* 6.05*  --  6.42* 7.42*  CALCIUM 8.0*  --  8.3*  --  8.7* 8.6*  MG  --   --   --  1.9  --   --   PHOS  --   --   --   --  5.3* 5.9*   Estimated Creatinine Clearance: 9.5 mL/min (by C-G formula based on Cr of 7.42).   LIVER  Recent Labs Lab 11/10/14 1220 11/14/14 0820  AST 23  --   ALT 16  --   ALKPHOS 74  --   BILITOT 1.3*  --   PROT 7.5  --   ALBUMIN 3.0* 2.1*  INR 1.29  --      INFECTIOUS No results for input(s): LATICACIDVEN, PROCALCITON in the last 168 hours.   ENDOCRINE CBG (last 3)   Recent Labs  11/14/14 2004 11/14/14 2354 11/15/14 0347  GLUCAP 94 78 91         IMAGING x48h   No results found.      ASSESSMENT / PLAN:  PULMONARY OETT 8/9 >  A:Intubated for airway protection - Acute Reso failure due to stroke \   - not ready for extubation due to secretions and agitation and aphasia. FAiled SBT 11/14/14  P:   SBT again 11/15/2014; doubt will pass. If not, Full vent support Plan early trach likely needed VAP bundle Daily SBT/WUA Daily CXR  CARDIOVASCULAR  A: Hypertension Pulmonary hypertension by echo> cause uncertain, has not had RHC PFO History of aortic aneurysm   - no acute issue  P:  BP management post TPA per neurology Tele  RENAL A:  ESRD   - RN reports LUE Fistula and RT subclavian tunneled cath P:   ? Needs R Subclavian cath removed this admission - renal to advice Renal consult involved Fluids at [facility]  GASTROINTESTINAL A:  Recent ischemic bowel, perforation, treated with surgery in [place] 2016 No acute issues now P:   NPO for now TF at goal ongoing   HEMATOLOGIC A:  S/p tPA 11/10/2014  P:  Monitor for bleeding CBC in AM  INFECTIOUS A:  No acute issues P:   Monitor for fever  ENDOCRINE A:  Hypoglycemia   - resolved after starting TF P:   DC D10  NEUROLOGIC A:  Acute L MCA stroke Acute encephalopathy post stroke> med related from intubation? ICH?   - agitated and  aphasic  P:   Hold sedation now PRN sedation - fent prn If waking up, use PAD protocol with intermittent fentanyl titrated  to RASS 0   FAMILY  - Updates: sister in law updated bedside 8/9. DTr updated 11/11/2014. Not at bedside 11/13/2014 and      - Inter-disciplinary family meet or Palliative Care meeting due by:  7; from admit 11/10/2014. Neuro has initated process 11/11/2014.  Full Code 11/14/2014   GLOBAL  - likely needs trach and placement     The patient is critically ill with multiple organ systems failure and requires high complexity decision making for assessment and support, frequent evaluation and titration of therapies, application of advanced monitoring technologies and extensive interpretation of multiple databases.   Critical Care Time devoted to patient care services described in this note is  30  Minutes. This time reflects time of care of this signee Dr Kalman Shan. This critical care time does not reflect procedure time, or teaching time or supervisory time of PA/NP/Med student/Med Resident etc but could involve care discussion time    Dr. Kalman Shan, M.D., [facility].C.P Pulmonary and Critical Care Medicine Staff Physician [facility] [place] Pager: [phone], If no answer or between  15:00h - 7:00h: call [phone]  11/15/2014 7:46 AM

## 2014-11-15 NOTE — Progress Notes (Signed)
Increased PS to 12 d/t high RR 35-40's. RN aware.

## 2014-11-15 NOTE — Progress Notes (Addendum)
STROKE TEAM PROGRESS NOTE   HISTORY [name] is an 59 y.o. female who was at dialysis and finished her session. After dialysis while still at the center she was noted to have sudden onset right sided weakness, left gaze deviation (LKW 11/10/2014 at 1106). EMS was called and on site BG was noted to be 50. Patient was given D50 and was transported to [facility] ED. On arrival she had labored respirations was not protecting her airway, and required intubation. CT of head was delayed due to need for intubation. CT head obtained and showed no acte stroke or bleed. Patient was deemed to be a candidate for TPA and was brought back to the ED. Dialysis needles were still present in the patient's AV fistula. IV team was consulted and removed the hemodialysis needles as well as achieved hemostasis. IV tPA was then administered. CT angiogram of the head and neck was obtained which showed no large vessel proximal occlusion. She was subsequently admitted to the neuro intensive care unit for further management.    SUBJECTIVE (INTERVAL HISTORY) No changes. Intutbated.   OBJECTIVE Temp:  [98.4 F (36.9 C)-99.3 F (37.4 C)] 98.4 F (36.9 C) (08/14 0357) Pulse Rate:  [94-120] 98 (08/14 0700) Cardiac Rhythm:  [-] Normal sinus rhythm (08/13 2000) Resp:  [17-36] 20 (08/14 0700) BP: (117-158)/(65-100) 131/73 mmHg (08/14 0700) SpO2:  [95 %-100 %] 98 % (08/14 0700) FiO2 (%):  [40 %] 40 % (08/14 0332) Weight:  [90.3 kg (199 lb 1.2 oz)] 90.3 kg (199 lb 1.2 oz) (08/13 1130)   Recent Labs Lab 11/14/14 1223 11/14/14 1542 11/14/14 2004 11/14/14 2354 11/15/14 0347  GLUCAP 95 105* 94 78 91    Recent Labs Lab 11/10/14 1220 11/10/14 1225 11/11/14 0330 11/13/14 0221 11/13/14 0529 11/14/14 0820  NA 135 134* 136  --  135 134*  K 3.4* 3.3* 3.3*  --  3.8 5.2*  CL 97* 97* 97*  --  98* 101  CO2 23  --  23  --  24 23  GLUCOSE 84 81 80  --  86 101*  BUN 25* 28* 27*  --  41* 68*  CREATININE 5.66* 5.30* 6.05*  --   6.42* 7.42*  CALCIUM 8.0*  --  8.3*  --  8.7* 8.6*  MG  --   --   --  1.9  --   --   PHOS  --   --   --   --  5.3* 5.9*    Recent Labs Lab 11/10/14 1220 11/14/14 0820  AST 23  --   ALT 16  --   ALKPHOS 74  --   BILITOT 1.3*  --   PROT 7.5  --   ALBUMIN 3.0* 2.1*    Recent Labs Lab 11/10/14 1220 11/10/14 1225 11/11/14 0330 11/13/14 0323  WBC 6.8  --  8.5 7.4  NEUTROABS 4.6  --  6.6  --   HGB 12.0 15.3* 12.6 10.7*  HCT 36.9 45.0 38.9 32.6*  MCV 90.2  --  89.8 88.3  PLT 202  --  186 172   No results for input(s): CKTOTAL, CKMB, CKMBINDEX, TROPONINI in the last 168 hours. No results for input(s): LABPROT, INR in the last 72 hours. No results for input(s): COLORURINE, LABSPEC, PHURINE, GLUCOSEU, HGBUR, BILIRUBINUR, KETONESUR, PROTEINUR, UROBILINOGEN, NITRITE, LEUKOCYTESUR in the last 72 hours.  Invalid input(s): APPERANCEUR     Component Value Date/Time   CHOL 135 11/11/2014 0330   TRIG 95 11/11/2014 0330  HDL 37* 11/11/2014 0330   CHOLHDL 3.6 11/11/2014 0330   VLDL 19 11/11/2014 0330   LDLCALC 79 11/11/2014 0330   Lab Results  Component Value Date   HGBA1C 4.8 11/11/2014      Component Value Date/Time   LABOPIA NONE DETECTED 11/10/2014 1336   COCAINSCRNUR NONE DETECTED 11/10/2014 1336   LABBENZ POSITIVE* 11/10/2014 1336   AMPHETMU NONE DETECTED 11/10/2014 1336   THCU POSITIVE* 11/10/2014 1336   LABBARB NONE DETECTED 11/10/2014 1336     Recent Labs Lab 11/10/14 1220  ETH <5     IMAGING    MRI Brain without contrast 11/12/2014 1. Moderate size left MCA territory infarct affecting the left insula and operculum. No associated hemorrhage or mass effect. 2. Expected evolution of the posterior right ACA and posterior left MCA infarcts seen in March.   Ct Head Wo Contrast 11/10/2014    No evidence for post t-PA hemorrhage. Early cytotoxic edema now noted in the LEFT hemisphere.    11/10/2014   1. No acute intracranial pathology.     Ct Angio Head &  Neck W/cm &/or Wo/cm 11/10/2014    1. 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  2. No proximal large vessel occlusion.  3. Moderate irregular narrowing and attenuation of ACA branch vessels, progressed from prior MRA.  4. Mild-to-moderate plaque in the cervical carotid arteries without stenosis.  5. Mild proximal left vertebral artery stenosis.     Portable Chest Xray 11/11/2014    1. Lines and tubes in stable position. 2. New onset of dense left lower lobe atelectasis and consolidation. Small left pleural effusion.    11/10/2014   Endotracheal tube terminates 3 cm above the carina.     Dg Abd Portable 1v 11/11/2014    1. G-tube seen ending overlying the body of the stomach, with contrast filling the fundus of the stomach as expected.  2. Dense left basilar airspace opacification raises concern for pneumonia.     EEG : mild global cerebral dysfunction as seen in a wide variety of toxic metabolic encephalopathies and degenerative brain disorders. No electrographic seizures noted.  2DEcho :07/23/14 :  Left ventricle: The cavity size was normal. There was moderate concentric hypertrophy. Systolic function was normal. The estimated ejection fraction was in the range of 60% to 65%.  Carotid Dopplers 06/22/2014 : The vertebral arteries appear patent with antegrade flow. - Findings consistent with 1- 39 percent stenosis involving the right internal carotid artery and the left internal carotid artery.   PHYSICAL EXAM Elderly African-American lady who is intubated. . Afebrile. Head is nontraumatic. Neck is supple without bruit.    Cardiac exam no murmur or gallop. Lungs are clear to auscultation. Distal pulses are well felt. Neurological Exam :  Eyes open .Aphasic. Lethargic. Focuses but does not following commands but will track. Left gaze preference. Unable to look to the right past midline even with doll's eye movements. Pupils regular round but reactive 5 mm.  Vision acuity  cannot be tested. Blinks to threat minimally on the right and partially on the left. Right lower facial weakness . Spontaneous left upper and lower extremity moments; right-sided weakness but able to withdraw to pain. Deep tendon reflexes are depressed. Plantars are both equivocal. Sensation:  Withdraws to noxious stimuli bilaterally L>R Gait was not tested.  ASSESSMENT: [name] is a 59 y.o. female with history of multiple medical problems including ESRD, recent stroke and recent bowel ischemia leading to resection presenting with right  sided weakness, left gaze deviation. She received IV t-PA 11/10/2014 at 1340.   Stroke:  Dominant left MCA infarct embolic secondary to known atrial fibrillation   Resultant  VDRF, right hemiparesis, aphasia  MRI  Moderate size left MCA territory infarct affecting the left insula and operculum.  CTA head & neck L M2 branch occlusion. ACA atherosclerosis progressed since March. Plaque in bilateral ICAs and L VA. No proximal large vessel occlusion  2D Echo see 07/23/2014   TEE 06/23/2014 PFO. Severe RV dysfunction, RAE, TR. Incessant atrial tachycardia  Given known PFO, check LE venous dopplers  LDL 79  HgbA1c 4.8 on 11/11/2014  SCDs for VTE prophylaxis (listed heparin antibody positive) Diet NPO time specified  aspirin 81 mg orally every day prior to admission, now on 300 mg rectal.  Ongoing aggressive stroke risk factor management  Continued need for ICU level care  Therapy recommendations:  pending   Disposition:  pending   Paroxysmal Atrial Fibrillation  Documented on tele 07/27/2014  Decided not an anticoagulation candidate at that time as risk outweighs benefits given multiple medical issues (see note 07/28/2014)  Respiratory Failure  Intubated in ED  Recommend addressing code status prior to extubation   Possible seizure  Intermittent, arrhythmic twitching of R hand  Started on Keppra, 1 gm load followed by 500 mg bid  EEG  pending   AcceleratedHypertension Pulmonary hypertension   BP 189/118 on arrival  Improved  Not on BP meds as NPO  Hyperlipidemia  Home meds:  No statin  LDL 79, goal < 70  Add statin once able to swallow  Diabetes  Hypoglycemia over night w/ dextrose given  HgbA1c 6.5 in March, at goal < 7.0  Controlled  Other Stroke Risk Factors  Cigarette smoker, advised to stop smoking  ETOH use  UDS positive for benzos and THC  Obesity, Body mass index is 31.17 kg/(m^2).   Hx stroke/TIA - Right hemisphere acute infarcts involving right ACA, left PCA, and left MCA, cardioembolic pattern, pt w/ PAF during prolonged hospitalization 06/2014 at that time "Given her multitude of co morbidities (including fistula) she would not be an optimal candidate for anticoagulation. Risks outweigh benefits. Family understands risk of future stroke/ thrombus"  Known PFO  Other Active Problems  ESRD on HD T, TH, S  AAA 3 cm by CT 08/2014  Other Pertinent History  Recent admission for bowel ischemia and resection [place] 2016; palliative care heavily involved   CRITICAL CARE NEUROLOGY ATTENDING NOTE Patient was seen and examined by me personally. I reviewed notes, independently viewed imaging studies, participated in medical decision making and plan of care. I have made additions or clarifications directly to the above note.  Documentation accurately reflects findings. The laboratory and radiographic studies were personally reviewed by me.  ROS could not be fully documented due to aphasia and LOC  Assessment and plan completed by me personally and fully documented above.  Plans include:   Neuro:  Patient is unchanged.  Stroke less than 1/3 of arterial territory.  As we approach day 7, will need to consider anticoagulation for the paroxysmal afib.  Choice of anticoagulation will be determined by family choices regarding PEG as well as likelihood of successful extubation; lower extremity  dopplers were ordered on 8/12 given patient's PFO; and, results are pending Cardiac:  Continuous cardiopulmonary monitoring; long-term goal of normotension for secondary prevention may require addition of antihypertensive  Pulmonary:  Remains intubated until family decision regarding long-term choices GI:  Bowel movements today;  Bowel protocol change not required  Endocrine:  Ordered TSH; previously abnormal  Heme:  H/H decreasing  May be related to chronic illness/hospitalization; will re-check in the AM to ensure no unexpected drops ID:  Afebrile; CBC in AM for follow-up WBC Electrolytes:  Critical care protocols; Slight hyponatremia; also noted to have hyperkalemia; Renal following and managing HD  Condition is unchanged  This patient is critically ill and at significant risk of neurological worsening, death and care requires constant monitoring of vital signs, hemodynamics,respiratory and cardiac monitoring, extensive review of multiple databases, frequent neurological assessment, discussion with family, other specialists and medical decision making of high complexity.  This critical care time does not reflect procedure time, or teaching time or supervisory time of PA/NP/Med Resident etc. but could involve care discussion time.  I spent 40 minutes of Neurocritical Care time in the care of  this patient.  Hospital day # 5  Saveah Bahar M Levora Dredge [facility] See Amion for Pager information 11/15/2014 7:49 AM        To contact Stroke Continuity provider, please refer to [email or web address]. After hours, contact General Neurology

## 2014-11-16 ENCOUNTER — Inpatient Hospital Stay (HOSPITAL_COMMUNITY): Payer: Medicaid Other

## 2014-11-16 DIAGNOSIS — Z8669 Personal history of other diseases of the nervous system and sense organs: Secondary | ICD-10-CM

## 2014-11-16 DIAGNOSIS — E785 Hyperlipidemia, unspecified: Secondary | ICD-10-CM

## 2014-11-16 DIAGNOSIS — N186 End stage renal disease: Secondary | ICD-10-CM

## 2014-11-16 DIAGNOSIS — I480 Paroxysmal atrial fibrillation: Secondary | ICD-10-CM | POA: Diagnosis present

## 2014-11-16 DIAGNOSIS — I10 Essential (primary) hypertension: Secondary | ICD-10-CM | POA: Diagnosis present

## 2014-11-16 DIAGNOSIS — Z992 Dependence on renal dialysis: Secondary | ICD-10-CM

## 2014-11-16 DIAGNOSIS — J9601 Acute respiratory failure with hypoxia: Secondary | ICD-10-CM

## 2014-11-16 DIAGNOSIS — Z8673 Personal history of transient ischemic attack (TIA), and cerebral infarction without residual deficits: Secondary | ICD-10-CM

## 2014-11-16 LAB — GLUCOSE, CAPILLARY
GLUCOSE-CAPILLARY: 99 mg/dL (ref 65–99)
GLUCOSE-CAPILLARY: 92 mg/dL (ref 65–99)
Glucose-Capillary: 84 mg/dL (ref 65–99)
Glucose-Capillary: 86 mg/dL (ref 65–99)
Glucose-Capillary: 116 mg/dL — ABNORMAL HIGH (ref 65–99)
Glucose-Capillary: 94 mg/dL (ref 65–99)

## 2014-11-16 LAB — CBC
HCT: 33.5 % — ABNORMAL LOW (ref 36.0–46.0)
HEMOGLOBIN: 10.8 g/dL — AB (ref 12.0–15.0)
MCH: 28.1 pg (ref 26.0–34.0)
MCHC: 32.2 g/dL (ref 30.0–36.0)
MCV: 87 fL (ref 78.0–100.0)
Platelets: 179 10*3/uL (ref 150–400)
RBC: 3.85 MIL/uL — AB (ref 3.87–5.11)
RDW: 19.2 % — ABNORMAL HIGH (ref 11.5–15.5)
WBC: 6.6 10*3/uL (ref 4.0–10.5)

## 2014-11-16 LAB — COMPREHENSIVE METABOLIC PANEL
ALK PHOS: 67 U/L (ref 38–126)
ALT: 11 U/L — ABNORMAL LOW (ref 14–54)
ANION GAP: 16 — AB (ref 5–15)
AST: 22 U/L (ref 15–41)
Albumin: 2.1 g/dL — ABNORMAL LOW (ref 3.5–5.0)
BUN: 95 mg/dL — ABNORMAL HIGH (ref 6–20)
CALCIUM: 8.8 mg/dL — AB (ref 8.9–10.3)
CO2: 21 mmol/L — AB (ref 22–32)
Chloride: 103 mmol/L (ref 101–111)
Creatinine, Ser: 7.63 mg/dL — ABNORMAL HIGH (ref 0.44–1.00)
GFR calc non Af Amer: 5 mL/min — ABNORMAL LOW (ref >60)
GFR, EST AFRICAN AMERICAN: 6 mL/min — AB (ref >60)
Glucose, Bld: 79 mg/dL (ref 65–99)
Potassium: 4.4 mmol/L (ref 3.5–5.1)
SODIUM: 140 mmol/L (ref 135–145)
TOTAL PROTEIN: 6.4 g/dL — AB (ref 6.5–8.1)
Total Bilirubin: 0.8 mg/dL (ref 0.3–1.2)

## 2014-11-16 LAB — TSH: TSH: 2.697 u[IU]/mL (ref 0.350–4.500)

## 2014-11-16 MED ORDER — ATORVASTATIN CALCIUM 10 MG PO TABS
10.0000 mg | ORAL_TABLET | Freq: Every day | ORAL | Status: DC
  Administered 2014-11-16 – 2014-11-20 (×5): 10 mg via ORAL
  Filled 2014-11-16 (×7): qty 1

## 2014-11-16 MED ORDER — ASPIRIN 325 MG PO TABS
325.0000 mg | ORAL_TABLET | Freq: Every day | ORAL | Status: DC
  Administered 2014-11-16 – 2014-11-25 (×10): 325 mg via ORAL
  Filled 2014-11-16 (×10): qty 1

## 2014-11-16 NOTE — Progress Notes (Signed)
Placed patient on PSV 12 peep 5 and 40% fi02.  Patient is tolerating well.  RN aware.

## 2014-11-16 NOTE — Progress Notes (Signed)
   11/16/14 1501  Vent Select  Invasive or Noninvasive Invasive  Adult Vent Y  Airway 7.5 mm  Placement Date/Time: 11/10/14 1240   Placed By: ED Physician  Airway Device: Endotracheal Tube  Laryngoscope Blade: MAC;4  ETT Types: Oral  Size (mm): 7.5 mm  Cuffed: Cuffed  Insertion attempts: 1  Airway Equipment: Lighted Stylet;Video Laryngoscope  ...  Secured at (cm) 24 cm  Measured From Lips  Secured Location Right  Secured By Brewing technologist Repositioned Yes  Cuff Pressure (cm H2O) 25 cm H2O  Adult Ventilator Settings  Vent Type Servo i  Humidity HME  Vent Mode PRVC  Vt Set 570 mL  Set Rate 14 bmp  FiO2 (%) 40 %  PEEP 5 cmH20  Adult Ventilator Measurements  Peak Airway Pressure 19 L/min  Mean Airway Pressure 10 cmH20  Resp Rate Spontaneous 30 br/min  Resp Rate Total 30 br/min  Exhaled Vt 532 mL  Measured Ve 14.6 mL  Total PEEP 5 cmH20  HOB> 30 Degrees Y  Adult Ventilator Alarms  Alarms On Y  Ve High Alarm 21 L/min  Ve Low Alarm 4 L/min  Resp Rate High Alarm 38 br/min  Resp Rate Low Alarm 10  PEEP Low Alarm 3 cmH2O  Press High Alarm 50 cmH2O  T Apnea 20 sec(s)  VAP Prevention  Cuff pressure (initial) 25 cm H2O  Cuff pressure after change 25 cm H2O  HME changed Yes  Breath Sounds  Bilateral Breath Sounds Rhonchi  Suction Method  Suctioning Airway  Airway Suctioning/Secretions  Suction Type ETT  Suction Device  Inline  Secretion Amount Moderate  Secretion Color Tan  Secretion Consistency Thin  Suction Tolerance Tolerated well  Suctioning Adverse Effects None  Placed patient back on PRVC and initial settings prior to PSV due to increased WOB.  RN is aware.

## 2014-11-16 NOTE — Progress Notes (Signed)
[name], female   DOB: 1955-09-08, 59 y.o.   MRN: [id number]  [facility] Progress Note   Assessment/ Plan:   1. Acute CVA/new right-sided hemiparesis: With evidence of embolic stroke in several regions likely secondary to atrial fibrillation. Unfortunately, with preceding left hemiparesis from previous CVA that was seemingly improving until the acute event. With VDRF at this point. 2.ESRD continue hemodialysis Tuesday/Thursday/Saturday schedule via her left brachiocephalic fistula-currently without any acute needs 3. 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 5. Nutrition: On high-protein tube feeds, continue to monitor 6. Hypertension: Maintaining acceptable blood pressures at this time off midodrine, continue to monitor  Subjective:   No acute events overnight, continue to monitor.    Objective:   BP 111/65 mmHg  Pulse 121  Temp(Src) 97.5 F (36.4 C) (Axillary)  Resp 32  Ht 5' 7.01" (1.702 m)  Wt 91.2 kg (201 lb 1 oz)  BMI 31.48 kg/m2  SpO2 96%  Physical Exam: Gen: Intubated, sedated CVS: Irregular tachycardia, S1 and S2 normal Resp: Coarse/mechanical breath sounds bilaterally Abd: Soft, obese, nontender Ext: No lower extremity edema-palpable thrill left brachiocephalic fistula  Labs: BMET  Recent Labs Lab 11/10/14 1220 11/10/14 1225 11/11/14 0330 11/13/14 0529 11/14/14 0820 11/16/14 0343  NA 135 134* 136 135 134* 140  K 3.4* 3.3* 3.3* 3.8 5.2* 4.4  CL 97* 97* 97* 98* 101 103  CO2 23  --  21*  GLUCOSE 84 81 80 86 101* 79  BUN 25* 28* 27* 41* 68* 95*  CREATININE 5.66* 5.30* 6.05* 6.42* 7.42* 7.63*  CALCIUM 8.0*  --  8.3* 8.7* 8.6* 8.8*  PHOS  --   --   --  5.3* 5.9*  --    CBC  Recent Labs Lab 11/10/14 1220 11/10/14 1225 11/11/14 0330 11/13/14 0323  11/16/14 0343  WBC 6.8  --  8.5 7.4 6.6  NEUTROABS 4.6  --  6.6  --   --   HGB 12.0 15.3* 12.6 10.7* 10.8*  HCT 36.9 45.0 38.9 32.6* 33.5*  MCV 90.2  --  89.8 88.3 87.0  PLT 202  --  186 172 179   Medications:    . antiseptic oral rinse  7 mL Mouth Rinse QID  . aspirin  300 mg Rectal Daily  . budesonide  0.25 mg Nebulization BID  . chlorhexidine gluconate  15 mL Mouth Rinse BID  . famotidine  20 mg Per Tube Daily  . feeding supplement (PRO-STAT SUGAR FREE 64)  30 mL Per Tube 5 X Daily  . feeding supplement (VITAL HIGH PROTEIN)  1,000 mL Per Tube Q24H  . ipratropium  0.5 mg Nebulization Q6H  . levalbuterol  0.63 mg Nebulization Q6H  . levETIRAcetam  500 mg Per Tube BID  . levothyroxine  25 mcg Oral QAC breakfast  . pantoprazole (PROTONIX) IV  40 mg Intravenous QHS   Zetta Bills, MD 11/16/2014, 8:01 AM

## 2014-11-16 NOTE — Progress Notes (Signed)
PULMONARY / CRITICAL CARE MEDICINE   Name: [name] MRN: [id number] DOB: 09/22/1955    ADMISSION DATE:  11/10/2014 CONSULTATION DATE:  11/10/2014  REFERRING MD :  Pearlean Brownie  CHIEF COMPLAINT:  Stroke, post TPA  INITIAL PRESENTATION:  This is a 59 y/o female who had an acute left MCA stroke today and received IV TPA.  She was intubated for airway protection in the ER.  She has a long history of ESRD, a recent R ACA stroke, and a recent history of ischemic bowel requiring resection.  Apparently today at dialysis she developed sudden onset of R sided weakness.  She was brought to the ER for further evaluation.  No further history could be obtained due to intubation.  Of note, after TPA she had bleeding from her AV fistula   STUDIES:  8/9 CT head > no acute abnormality 8/9 CT angiogram post tpa> L MCA m2 superior division branch vessel occlusion, moderate irregular narrowing and attenuation of ACA branch vessels, progressed from prior MRA 8/9 admission, IV TPA for L MCA stroke  11/11/14: CT head pending. Neuro Dr Pearlean Brownie discussing goals with dtr. Dtr denied any questions to me. On prn sedation - receieved none - followed some command with delayed response but somnolent  11/13/2014: 11/12/14 Moderate MCAon MRI yesterday.  Still on vent. Family not at bedside. No change noted other than agitation overnight with same during SBT today. Lot of resp sexcretions. RN says dtr - very reluctant for DNR  11/14/14: Dr Pearlean Brownie goals of care - full code (family not buying gloom and doom message based on prior experience). Remains aphasic with right hemiparesis. LE duplex negative for DVT. On HD.    SUBJECTIVE/OVERNIGHT/INTERVAL HX No events overnight, opens eyes but not following commands.  VITAL SIGNS: Temp:  [97.5 F (36.4 C)-98.6 F (37 C)] 98.6 F (37 C) (08/15 0802) Pulse Rate:  [92-124] 94 (08/15 1000) Resp:  [14-32] 19 (08/15 1000) BP: (104-139)/(59-86) 127/75 mmHg (08/15 1000) SpO2:  [95 %-100 %]  99 % (08/15 1000) FiO2 (%):  [40 %] 40 % (08/15 0800) Weight:  [89.9 kg (198 lb 3.1 oz)-91.2 kg (201 lb 1 oz)] 91.2 kg (201 lb 1 oz) (08/15 0412)   HEMODYNAMICS:   VENTILATOR SETTINGS: Vent Mode:  [-] PSV FiO2 (%):  [40 %] 40 % Set Rate:  [14 bmp] 14 bmp Vt Set:  [570 mL] 570 mL PEEP:  [5 cmH20] 5 cmH20 Pressure Support:  [12 cmH20] 12 cmH20 Plateau Pressure:  [16 cmH20-25 cmH20] 25 cmH20   INTAKE / OUTPUT:  Intake/Output Summary (Last 24 hours) at 11/16/14 1012 Last data filed at 11/16/14 0900  Gross per 24 hour  Intake    950 ml  Output      0 ml  Net    950 ml   PHYSICAL EXAMINATION: General:  Chronically ill appearing, on vent Neuro:  Has weak cough to tracheal suctioning,  awake . Aphasic per neuro HEENT:  NCAT, ETT in place Cardiovascular:  RRR, systolic murmur noted, left upper arm AV fistula > dressed Lungs:  Vent supported breaths, bilateral, coarse breath sounds, junky sounding, Coughs Abdomen:  BS+, soft, nontender Musculoskeletal:  Diminished tone on my exam Skin:  R IJ perm cath> clean, PEG > clean  LABS: PULMONARY  Recent Labs Lab 11/10/14 1225 11/10/14 1325  PHART  --  7.331*  PCO2ART  --  52.3*  PO2ART  --  409.0*  HCO3  --  27.6*  TCO2 23 29  O2SAT  --  100.0   CBC  Recent Labs Lab 11/11/14 0330 11/13/14 0323 11/16/14 0343  HGB 12.6 10.7* 10.8*  HCT 38.9 32.6* 33.5*  WBC 8.5 7.4 6.6  PLT 186 172 179   COAGULATION  Recent Labs Lab 11/10/14 1220  INR 1.29   CARDIAC  No results for input(s): TROPONINI in the last 168 hours. No results for input(s): PROBNP in the last 168 hours.  CHEMISTRY  Recent Labs Lab 11/10/14 1220 11/10/14 1225 11/11/14 0330 11/13/14 0221 11/13/14 0529 11/14/14 0820 11/16/14 0343  NA 135 134* 136  --  135 134* 140  K 3.4* 3.3* 3.3*  --  3.8 5.2* 4.4  CL 97* 97* 97*  --  98* 101 103  CO2 23  --  23  --  24 23 21*  GLUCOSE 84 81 80  --  86 101* 79  BUN 25* 28* 27*  --  41* 68* 95*  CREATININE  5.66* 5.30* 6.05*  --  6.42* 7.42* 7.63*  CALCIUM 8.0*  --  8.3*  --  8.7* 8.6* 8.8*  MG  --   --   --  1.9  --   --   --   PHOS  --   --   --   --  5.3* 5.9*  --    Estimated Creatinine Clearance: 9.3 mL/min (by C-G formula based on Cr of 7.63).  LIVER  Recent Labs Lab 11/10/14 1220 11/14/14 0820 11/16/14 0343  AST 23  --  22  ALT 16  --  11*  ALKPHOS 74  --  67  BILITOT 1.3*  --  0.8  PROT 7.5  --  6.4*  ALBUMIN 3.0* 2.1* 2.1*  INR 1.29  --   --    INFECTIOUS No results for input(s): LATICACIDVEN, PROCALCITON in the last 168 hours.  ENDOCRINE CBG (last 3)   Recent Labs  11/15/14 2347 11/16/14 0426 11/16/14 0801  GLUCAP 93 84 86   IMAGING x48h   Dg Chest Port 1 View  11/16/2014   CLINICAL DATA:  Respiratory difficulty  EXAM: PORTABLE CHEST - 1 VIEW  COMPARISON:  11/15/2014  FINDINGS: Tubular device is stable. Left basilar consolidation is stable. The heart is prominent. Right lung is clear. No pneumothorax.  IMPRESSION: Stable left basilar consolidation.   Electronically Signed   By: Jolaine Click M.D.   On: 11/16/2014 07:44   Dg Chest Port 1 View  11/15/2014   CLINICAL DATA:  Follow-up endotracheal tube  EXAM: PORTABLE CHEST - 1 VIEW  COMPARISON:  11/13/2014  FINDINGS: Cardiomediastinal silhouette is stable. Dual lumen right IJ catheter is unchanged in position. Stable endotracheal tube position. Persistent left basilar atelectasis or infiltrate. No pulmonary edema.  IMPRESSION: Stable support apparatus. Persistent left basilar atelectasis on infiltrate. No pulmonary edema.   Electronically Signed   By: Natasha Mead M.D.   On: 11/15/2014 09:52   ASSESSMENT / PLAN:  PULMONARY OETT 8/9 >  A:Intubated for airway protection - Acute Resp failure due to stroke \ Weaning on PS trials but airway protection is the primary concern here.  P:   PS trials but no extubation given inability to protect his airway. Spoke with daughter over the phone regarding trach/peg, she is coming  here to discuss trach, will need early trach. VAP bundle Daily SBT/WUA Daily CXR  CARDIOVASCULAR  A: Hypertension Pulmonary hypertension by echo> cause uncertain, has not had RHC PFO History of aortic aneurysm  P:  BP management post TPA per neurology Tele  RENAL A:  ESRD   - RN reports LUE Fistula and RT subclavian tunneled cath P:   BMET in AM Replace electrolytes as indicated. Renal consult involved Fluids at [facility]  GASTROINTESTINAL A:  Recent ischemic bowel, perforation, treated with surgery in [place] 2016 No acute issues now P:   TF at goal ongoing  HEMATOLOGIC A:  S/p tPA 11/10/2014  P:  Monitor for bleeding CBC in AM Transfuse per ICU protocol  INFECTIOUS A:  No acute issues P:   Monitor for fever  ENDOCRINE A:  Hypoglycemia   - resolved after starting TF P:   Monitor CBGs  NEUROLOGIC A:  Acute L MCA stroke Acute encephalopathy post stroke> med related from intubation? ICH?  P:   D/C all sedation.  FAMILY  - Updates: Daughter on way in, spoke over the phone, we need to delineate trach/peg.  - Inter-disciplinary family meet or Palliative Care meeting due by:  7; from admit 11/10/2014. Neuro has initated process 11/11/2014.  Full Code 11/14/2014  The patient is critically ill with multiple organ systems failure and requires high complexity decision making for assessment and support, frequent evaluation and titration of therapies, application of advanced monitoring technologies and extensive interpretation of multiple databases.   Critical Care Time devoted to patient care services described in this note is  35  Minutes. This time reflects time of care of this signee Dr Koren Bound. This critical care time does not reflect procedure time, or teaching time or supervisory time of PA/NP/Med student/Med Resident etc but could involve care discussion time.  Alyson Reedy, M.D. [facility] Pulmonary/Critical Care Medicine. Pager: [phone]. After hours pager:  [phone].  11/16/2014 10:12 AM

## 2014-11-16 NOTE — Progress Notes (Signed)
STROKE TEAM PROGRESS NOTE   HISTORY [name] is an 59 y.o. female who was at dialysis and finished her session. After dialysis while still at the center she was noted to have sudden onset right sided weakness, left gaze deviation (LKW 11/10/2014 at 1106). EMS was called and on site BG was noted to be 50. Patient was given D50 and was transported to [facility] ED. On arrival she had labored respirations was not protecting her airway, and required intubation. CT of head was delayed due to need for intubation. CT head obtained and showed no acte stroke or bleed. Patient was deemed to be a candidate for TPA and was brought back to the ED. Dialysis needles were still present in the patient's AV fistula. IV team was consulted and removed the hemodialysis needles as well as achieved hemostasis. IV tPA was then administered. CT angiogram of the head and neck was obtained which showed no large vessel proximal occlusion. She was subsequently admitted to the neuro intensive care unit for further management.    SUBJECTIVE (INTERVAL HISTORY) She is still intutbated. Able open eyes on voice but not following commands. Discussed with CCM Dr. Elesa Massed and does not think she can be extubated due to mental status. May need discuss with family for early trach. Will have HD tomorrow. No afib on tele during rounds.   OBJECTIVE Temp:  [97.5 F (36.4 C)-98.6 F (37 C)] 98.6 F (37 C) (08/15 0802) Pulse Rate:  [92-124] 124 (08/15 0800) Cardiac Rhythm:  [-] Sinus tachycardia (08/15 0800) Resp:  [14-32] 25 (08/15 0800) BP: (104-139)/(59-86) 139/86 mmHg (08/15 0800) SpO2:  [95 %-100 %] 98 % (08/15 0800) FiO2 (%):  [40 %] 40 % (08/15 0800) Weight:  [198 lb 3.1 oz (89.9 kg)-201 lb 1 oz (91.2 kg)] 201 lb 1 oz (91.2 kg) (08/15 0412)   Recent Labs Lab 11/15/14 1558 11/15/14 2015 11/15/14 2347 11/16/14 0426 11/16/14 0801  GLUCAP 99 102* 93 84 86    Recent Labs Lab 11/10/14 1220 11/10/14 1225 11/11/14 0330  11/13/14 0221 11/13/14 0529 11/14/14 0820 11/16/14 0343  NA 135 134* 136  --  135 134* 140  K 3.4* 3.3* 3.3*  --  3.8 5.2* 4.4  CL 97* 97* 97*  --  98* 101 103  CO2 23  --  23  --  24 23 21*  GLUCOSE 84 81 80  --  86 101* 79  BUN 25* 28* 27*  --  41* 68* 95*  CREATININE 5.66* 5.30* 6.05*  --  6.42* 7.42* 7.63*  CALCIUM 8.0*  --  8.3*  --  8.7* 8.6* 8.8*  MG  --   --   --  1.9  --   --   --   PHOS  --   --   --   --  5.3* 5.9*  --     Recent Labs Lab 11/10/14 1220 11/14/14 0820 11/16/14 0343  AST 23  --  22  ALT 16  --  11*  ALKPHOS 74  --  67  BILITOT 1.3*  --  0.8  PROT 7.5  --  6.4*  ALBUMIN 3.0* 2.1* 2.1*    Recent Labs Lab 11/10/14 1220 11/10/14 1225 11/11/14 0330 11/13/14 0323 11/16/14 0343  WBC 6.8  --  8.5 7.4 6.6  NEUTROABS 4.6  --  6.6  --   --   HGB 12.0 15.3* 12.6 10.7* 10.8*  HCT 36.9 45.0 38.9 32.6* 33.5*  MCV 90.2  --  89.8 88.3 87.0  PLT 202  --  186 172 179   No results for input(s): CKTOTAL, CKMB, CKMBINDEX, TROPONINI in the last 168 hours. No results for input(s): LABPROT, INR in the last 72 hours. No results for input(s): COLORURINE, LABSPEC, PHURINE, GLUCOSEU, HGBUR, BILIRUBINUR, KETONESUR, PROTEINUR, UROBILINOGEN, NITRITE, LEUKOCYTESUR in the last 72 hours.  Invalid input(s): APPERANCEUR     Component Value Date/Time   CHOL 135 11/11/2014 0330   TRIG 95 11/11/2014 0330   HDL 37* 11/11/2014 0330   CHOLHDL 3.6 11/11/2014 0330   VLDL 19 11/11/2014 0330   LDLCALC 79 11/11/2014 0330   Lab Results  Component Value Date   HGBA1C 4.8 11/11/2014      Component Value Date/Time   LABOPIA NONE DETECTED 11/10/2014 1336   COCAINSCRNUR NONE DETECTED 11/10/2014 1336   LABBENZ POSITIVE* 11/10/2014 1336   AMPHETMU NONE DETECTED 11/10/2014 1336   THCU POSITIVE* 11/10/2014 1336   LABBARB NONE DETECTED 11/10/2014 1336     Recent Labs Lab 11/10/14 1220  ETH <5     IMAGING I have personally reviewed the radiological images below and agree  with the radiology interpretations.  MRI Brain without contrast 11/12/2014 1. Moderate size left MCA territory infarct affecting the left insula and operculum. No associated hemorrhage or mass effect. 2. Expected evolution of the posterior right ACA and posterior left MCA infarcts seen in March.  Ct Head Wo Contrast 11/10/2014    No evidence for post t-PA hemorrhage. Early cytotoxic edema now noted in the LEFT hemisphere.    11/10/2014   1. No acute intracranial pathology.     Ct Angio Head & Neck W/cm &/or Wo/cm 11/10/2014    1. 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  2. No proximal large vessel occlusion.  3. Moderate irregular narrowing and attenuation of ACA branch vessels, progressed from prior MRA.  4. Mild-to-moderate plaque in the cervical carotid arteries without stenosis.  5. Mild proximal left vertebral artery stenosis.    Portable Chest Xray 11/11/2014    1. Lines and tubes in stable position. 2. New onset of dense left lower lobe atelectasis and consolidation. Small left pleural effusion.    11/10/2014   Endotracheal tube terminates 3 cm above the carina.     EEG : mild global cerebral dysfunction as seen in a wide variety of toxic metabolic encephalopathies and degenerative brain disorders. No electrographic seizures noted.  2D-Echo 11/12/14 - Left ventricle: The cavity size was moderately reduced. Wall thickness was increased in a pattern of severe LVH. Systolic function was normal. The estimated ejection fraction was in the range of 60% to 65%. Wall motion was normal; there were no regional wall motion abnormalities. - Right ventricle: The cavity size was mildly dilated. Systolic function was mildly reduced. - Right atrium: The atrium was severely dilated. - Tricuspid valve: There was severe regurgitation. - Pulmonary arteries: Systolic pressure was severely increased. PA peak pressure: 86 mm Hg (S). - Pericardium, extracardiac: A small  pericardial effusion was identified circumferential to the heart. There was no evidence of hemodynamic compromise.   PHYSICAL EXAM  Temp:  [97.5 F (36.4 C)-98.6 F (37 C)] 98.6 F (37 C) (08/15 0802) Pulse Rate:  [92-124] 124 (08/15 0800) Resp:  [14-32] 25 (08/15 0800) BP: (104-139)/(59-86) 139/86 mmHg (08/15 0800) SpO2:  [95 %-100 %] 98 % (08/15 0800) FiO2 (%):  [40 %] 40 % (08/15 0800) Weight:  [198 lb 3.1 oz (89.9 kg)-201 lb 1 oz (91.2 kg)] 201 lb  1 oz (91.2 kg) (08/15 0412)  General - obese, well developed, intubated on vent.  Ophthalmologic - Fundi not visualized due to noncooperation.  Cardiovascular - Regular rate and rhythm.  Neuro - intubated on vent, open eyes on voice, but not following commands. PERRL, did not track objects, eyes in middle position, doll's eye present, corneal, gag and cough present. RUE flaccid, no movement on pain, LUE localize to pain and against gravity, LLE 2/5 on pain, and RLE 1/5 on pain with subtle withdraw. B/l LE tremor seen but intermittent, b/l unsustained ankle clonus. Babinski mute bilaterally. Sensory, coordination and gait not tested.  ASSESSMENT: [name] is a 59 y.o. female with history of HTN, ESRD on HD, SVT, PFO, previous stroke, COPD, AAA, COPD and recent bowel ischemia leading to resection admitted for right sided weakness, left gaze deviation. She received IV t-PA 11/10/2014 at 1340.   Stroke:  Dominant left MCA infarct embolic secondary to known atrial fibrillation   Resultant  VDRF, right hemiparesis  MRI  Moderate size left MCA territory infarct.  CTA head & neck L M2 branch occlusion.   2D Echo EF 60-65%   TEE 06/23/2014 PFO. Severe RV dysfunction, RAE, TR. Incessant atrial tachycardia  LE venous dopplers negative for DVT  LDL 79  HgbA1c 4.8  SCDs for VTE prophylaxis (listed heparin antibody positive, not able to do arixtra due to ESRD) Diet NPO time specified  aspirin 81 mg orally every day prior to  admission, now on 300 mg rectal. My need to discuss with family regarding anticoagulation in 5-7 days as pt was not candidate for [facility] in the past.  Ongoing aggressive stroke risk factor management  Continued need for ICU level care  Therapy recommendations:  pending   Disposition:  pending   Paroxysmal Atrial Fibrillation  Documented on tele 07/27/2014  CHADS-VASc is 4 (stroke, HTN, Female).   Decided not an anticoagulation candidate at that time as risk outweighs benefits given multiple medical issues (see note 07/28/2014 from Dr. Anne Fu -"Ultimately deserves anticoagulation (coumadin). Given her multitude of co morbidities (including fistula) she would not be an optimal candidate for anticoagulation. Risks outweigh benefits. Family understands risk of future stroke/ thrombus.")  Previous stroke  Prolonged hospitalization from 06/17/14 to 09/11/14  Right hemisphere acute infarcts involving right ACA, left PCA, and left MCA, cardioembolic pattern, pt w/ PAF  Home meds include ASA    Respiratory Failure  Intubated in ED  Recommend addressing code status prior to extubation  May consider trach if family aggressive on treatment options   ? Possible seizure  Intermittent, arrhythmic twitching of R hand  Started on Keppra, 1 gm load followed by 500 mg bid  EEG negative for seizure  AcceleratedHypertension   BP 189/118 on arrival  Improved  Not on BP meds as NPO  Hyperlipidemia  Home meds:  No statin  LDL 79, goal < 70  Add lipitor  daily  Diabetes  Hypoglycemia over night w/ dextrose given  HgbA1c 4.8, at goal < 7.0  Controlled  Other Stroke Risk Factors  Cigarette smoker  ETOH use  UDS positive for benzos and THC  Obesity, Body mass index is 31.48 kg/(m^2).   Known PFO  Other Active Problems  ESRD on HD T, TH, S  AAA 3 cm by CT 08/2014  Other Pertinent History  Recent admission for bowel ischemia and resection [place] 2016; palliative  care heavily involved.  Full code  Hospital day # 6  This patient is  critically ill due to left MCA infarct, with left M2 occlusion, respiratory failure, Afib, ESRD and at significant risk of neurological worsening, death form recurrent infarcts, respiratory failure, heart failure, fluid overload. This patient's care requires constant monitoring of vital signs, hemodynamics, respiratory and cardiac monitoring, review of multiple databases, neurological assessment, discussion with family, other specialists and medical decision making of high complexity. I spent 45 minutes of neurocritical care time in the care of this patient.  Marvel Plan, MD PhD Stroke Neurology 11/16/2014 2:40 PM  To contact Stroke Continuity provider, please refer to [email or web address]. After hours, contact General Neurology

## 2014-11-17 ENCOUNTER — Inpatient Hospital Stay (HOSPITAL_COMMUNITY): Payer: Medicaid Other

## 2014-11-17 DIAGNOSIS — Z992 Dependence on renal dialysis: Secondary | ICD-10-CM

## 2014-11-17 DIAGNOSIS — N186 End stage renal disease: Secondary | ICD-10-CM

## 2014-11-17 DIAGNOSIS — Z8673 Personal history of transient ischemic attack (TIA), and cerebral infarction without residual deficits: Secondary | ICD-10-CM

## 2014-11-17 LAB — BLOOD GAS, ARTERIAL
ACID-BASE DEFICIT: 1.9 mmol/L (ref 0.0–2.0)
BICARBONATE: 22.7 meq/L (ref 20.0–24.0)
Drawn by: 419771
FIO2: 0.4
O2 SAT: 94.8 %
PEEP/CPAP: 5 cmH2O
PH ART: 7.361 (ref 7.350–7.450)
Patient temperature: 98.6
RATE: 14 resp/min
TCO2: 24 mmol/L (ref 0–100)
VT: 570 mL
pCO2 arterial: 41.2 mmHg (ref 35.0–45.0)
pO2, Arterial: 84.2 mmHg (ref 80.0–100.0)

## 2014-11-17 LAB — PHOSPHORUS: PHOSPHORUS: 6.3 mg/dL — AB (ref 2.5–4.6)

## 2014-11-17 LAB — GLUCOSE, CAPILLARY
GLUCOSE-CAPILLARY: 106 mg/dL — AB (ref 65–99)
GLUCOSE-CAPILLARY: 92 mg/dL (ref 65–99)
GLUCOSE-CAPILLARY: 93 mg/dL (ref 65–99)
GLUCOSE-CAPILLARY: 97 mg/dL (ref 65–99)
Glucose-Capillary: 125 mg/dL — ABNORMAL HIGH (ref 65–99)
Glucose-Capillary: 98 mg/dL (ref 65–99)

## 2014-11-17 LAB — BASIC METABOLIC PANEL
ANION GAP: 16 — AB (ref 5–15)
BUN: 119 mg/dL — ABNORMAL HIGH (ref 6–20)
CO2: 21 mmol/L — AB (ref 22–32)
Calcium: 8.8 mg/dL — ABNORMAL LOW (ref 8.9–10.3)
Chloride: 100 mmol/L — ABNORMAL LOW (ref 101–111)
Creatinine, Ser: 8.37 mg/dL — ABNORMAL HIGH (ref 0.44–1.00)
GFR calc non Af Amer: 5 mL/min — ABNORMAL LOW (ref >60)
GFR, EST AFRICAN AMERICAN: 5 mL/min — AB (ref >60)
GLUCOSE: 93 mg/dL (ref 65–99)
POTASSIUM: 4.3 mmol/L (ref 3.5–5.1)
Sodium: 137 mmol/L (ref 135–145)

## 2014-11-17 LAB — CBC
HEMATOCRIT: 33 % — AB (ref 36.0–46.0)
HEMOGLOBIN: 10.5 g/dL — AB (ref 12.0–15.0)
MCH: 28.3 pg (ref 26.0–34.0)
MCHC: 31.8 g/dL (ref 30.0–36.0)
MCV: 88.9 fL (ref 78.0–100.0)
Platelets: 242 10*3/uL (ref 150–400)
RBC: 3.71 MIL/uL — AB (ref 3.87–5.11)
RDW: 18.8 % — ABNORMAL HIGH (ref 11.5–15.5)
WBC: 7.2 10*3/uL (ref 4.0–10.5)

## 2014-11-17 LAB — MAGNESIUM: Magnesium: 2.1 mg/dL (ref 1.7–2.4)

## 2014-11-17 MED ORDER — LANTHANUM CARBONATE 500 MG PO CHEW
500.0000 mg | CHEWABLE_TABLET | Freq: Three times a day (TID) | ORAL | Status: DC
  Administered 2014-11-17 – 2014-11-25 (×25): 500 mg
  Filled 2014-11-17 (×26): qty 1

## 2014-11-17 NOTE — Progress Notes (Signed)
STROKE TEAM PROGRESS NOTE   HISTORY [name] is an 59 y.o. female who was at dialysis and finished her session. After dialysis while still at the center she was noted to have sudden onset right sided weakness, left gaze deviation (LKW 11/10/2014 at 1106). EMS was called and on site BG was noted to be 50. Patient was given D50 and was transported to [facility] ED. On arrival she had labored respirations was not protecting her airway, and required intubation. CT of head was delayed due to need for intubation. CT head obtained and showed no acte stroke or bleed. Patient was deemed to be a candidate for TPA and was brought back to the ED. Dialysis needles were still present in the patient's AV fistula. IV team was consulted and removed the hemodialysis needles as well as achieved hemostasis. IV tPA was then administered. CT angiogram of the head and neck was obtained which showed no large vessel proximal occlusion. She was subsequently admitted to the neuro intensive care unit for further management.    SUBJECTIVE (INTERVAL HISTORY) She is still intutbated. Able open eyes on voice but not following commands. Discussed with daughter at bedside and she would like to talk to her brother for decision making but leaning towards extubation without trach. No afib on tele during rounds.   OBJECTIVE Temp:  [98.1 F (36.7 C)-98.7 F (37.1 C)] 98.5 F (36.9 C) (08/16 1430) Pulse Rate:  [92-107] 107 (08/16 1700) Cardiac Rhythm:  [-] Sinus tachycardia (08/16 1600) Resp:  [14-31] 29 (08/16 1700) BP: (105-141)/(61-103) 135/74 mmHg (08/16 1700) SpO2:  [95 %-100 %] 97 % (08/16 1700) FiO2 (%):  [40 %] 40 % (08/16 1700) Weight:  [204 lb 5.9 oz (92.7 kg)] 204 lb 5.9 oz (92.7 kg) (08/16 0500)   Recent Labs Lab 11/16/14 2327 11/17/14 0342 11/17/14 0737 11/17/14 1124 11/17/14 1639  GLUCAP 94 93 106* 125* 98    Recent Labs Lab 11/11/14 0330 11/13/14 0221 11/13/14 0529 11/14/14 0820 11/16/14 0343  11/17/14 0249  NA 136  --  135 134* 140 137  K 3.3*  --  3.8 5.2* 4.4 4.3  CL 97*  --  98* 101 103 100*  CO2 23  --  24 23 21* 21*  GLUCOSE 80  --  86 101* 79 93  BUN 27*  --  41* 68* 95* 119*  CREATININE 6.05*  --  6.42* 7.42* 7.63* 8.37*  CALCIUM 8.3*  --  8.7* 8.6* 8.8* 8.8*  MG  --  1.9  --   --   --  2.1  PHOS  --   --  5.3* 5.9*  --  6.3*    Recent Labs Lab 11/14/14 0820 11/16/14 0343  AST  --  22  ALT  --  11*  ALKPHOS  --  67  BILITOT  --  0.8  PROT  --  6.4*  ALBUMIN 2.1* 2.1*    Recent Labs Lab 11/11/14 0330 11/13/14 0323 11/16/14 0343 11/17/14 0249  WBC 8.5 7.4 6.6 7.2  NEUTROABS 6.6  --   --   --   HGB 12.6 10.7* 10.8* 10.5*  HCT 38.9 32.6* 33.5* 33.0*  MCV 89.8 88.3 87.0 88.9  PLT 186 172 179 242   No results for input(s): CKTOTAL, CKMB, CKMBINDEX, TROPONINI in the last 168 hours. No results for input(s): LABPROT, INR in the last 72 hours. No results for input(s): COLORURINE, LABSPEC, PHURINE, GLUCOSEU, HGBUR, BILIRUBINUR, KETONESUR, PROTEINUR, UROBILINOGEN, NITRITE, LEUKOCYTESUR in the last 72  hours.  Invalid input(s): APPERANCEUR     Component Value Date/Time   CHOL 135 11/11/2014 0330   TRIG 95 11/11/2014 0330   HDL 37* 11/11/2014 0330   CHOLHDL 3.6 11/11/2014 0330   VLDL 19 11/11/2014 0330   LDLCALC 79 11/11/2014 0330   Lab Results  Component Value Date   HGBA1C 4.8 11/11/2014      Component Value Date/Time   LABOPIA NONE DETECTED 11/10/2014 1336   COCAINSCRNUR NONE DETECTED 11/10/2014 1336   LABBENZ POSITIVE* 11/10/2014 1336   AMPHETMU NONE DETECTED 11/10/2014 1336   THCU POSITIVE* 11/10/2014 1336   LABBARB NONE DETECTED 11/10/2014 1336    No results for input(s): ETH in the last 168 hours.   IMAGING I have personally reviewed the radiological images below and agree with the radiology interpretations.  MRI Brain without contrast 11/12/2014 1. Moderate size left MCA territory infarct affecting the left insula and operculum. No  associated hemorrhage or mass effect. 2. Expected evolution of the posterior right ACA and posterior left MCA infarcts seen in March.  Ct Head Wo Contrast 11/10/2014    No evidence for post t-PA hemorrhage. Early cytotoxic edema now noted in the LEFT hemisphere.    11/10/2014   1. No acute intracranial pathology.     Ct Angio Head & Neck W/cm &/or Wo/cm 11/10/2014    1. 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  2. No proximal large vessel occlusion.  3. Moderate irregular narrowing and attenuation of ACA branch vessels, progressed from prior MRA.  4. Mild-to-moderate plaque in the cervical carotid arteries without stenosis.  5. Mild proximal left vertebral artery stenosis.    Portable Chest Xray 11/11/2014    1. Lines and tubes in stable position. 2. New onset of dense left lower lobe atelectasis and consolidation. Small left pleural effusion.    11/10/2014   Endotracheal tube terminates 3 cm above the carina.     EEG : mild global cerebral dysfunction as seen in a wide variety of toxic metabolic encephalopathies and degenerative brain disorders. No electrographic seizures noted.  2D-Echo 11/12/14 - Left ventricle: The cavity size was moderately reduced. Wall thickness was increased in a pattern of severe LVH. Systolic function was normal. The estimated ejection fraction was in the range of 60% to 65%. Wall motion was normal; there were no regional wall motion abnormalities. - Right ventricle: The cavity size was mildly dilated. Systolic function was mildly reduced. - Right atrium: The atrium was severely dilated. - Tricuspid valve: There was severe regurgitation. - Pulmonary arteries: Systolic pressure was severely increased. PA peak pressure: 86 mm Hg (S). - Pericardium, extracardiac: A small pericardial effusion was identified circumferential to the heart. There was no evidence of hemodynamic compromise.   PHYSICAL EXAM  Temp:  [98.1 F (36.7 C)-98.7  F (37.1 C)] 98.5 F (36.9 C) (08/16 1430) Pulse Rate:  [92-107] 107 (08/16 1700) Resp:  [14-31] 29 (08/16 1700) BP: (105-141)/(61-103) 135/74 mmHg (08/16 1700) SpO2:  [95 %-100 %] 97 % (08/16 1700) FiO2 (%):  [40 %] 40 % (08/16 1700) Weight:  [204 lb 5.9 oz (92.7 kg)] 204 lb 5.9 oz (92.7 kg) (08/16 0500)  General - obese, well developed, intubated on vent.  Ophthalmologic - Fundi not visualized due to noncooperation.  Cardiovascular - Regular rate and rhythm.  Neuro - intubated on vent, open eyes on voice, but not following commands. PERRL, did not track objects, eyes left gaze position, doll's eye present, corneal, gag and cough present. RUE  0/5 proximal but 2+/5 distally, LUE localize to pain and against gravity, LLE 2/5 on pain, and RLE 2/5 on pain, b/l unsustained ankle clonus. Babinski mute bilaterally. Sensory, coordination and gait not tested.  ASSESSMENT: Ms. Dede MESSIAH ROVIRA is a 59 y.o. female with history of HTN, ESRD on HD, SVT, PFO, previous stroke, COPD, AAA, COPD and recent bowel ischemia leading to resection admitted for right sided weakness, left gaze deviation. She received IV t-PA 11/10/2014 at 1340.   Stroke:  Dominant left MCA infarct embolic secondary to known atrial fibrillation   Resultant  VDRF, right hemiparesis  MRI  Moderate size left MCA territory infarct.  CTA head & neck L M2 branch occlusion.   2D Echo EF 60-65%   TEE 06/23/2014 PFO. Severe RV dysfunction, RAE, TR. Incessant atrial tachycardia  LE venous dopplers negative for DVT  LDL 79  HgbA1c 4.8  SCDs for VTE prophylaxis (listed heparin antibody positive, not able to do arixtra due to ESRD) Diet NPO time specified  aspirin 81 mg orally every day prior to admission, now on 300 mg rectal. Discussed with daughter and she needs to talk with her brother but leaning towards one way extubation without trach  Ongoing aggressive stroke risk factor management  Continued need for ICU level  care  Therapy recommendations:  pending   Disposition:  pending   Paroxysmal Atrial Fibrillation  Documented on tele 07/27/2014  CHADS-VASc is 4 (stroke, HTN, Female).   Decided not an anticoagulation candidate at that time as risk outweighs benefits given multiple medical issues (see note 07/28/2014 from Dr. Anne Fu -"Ultimately deserves anticoagulation (coumadin). Given her multitude of co morbidities (including fistula) she would not be an optimal candidate for anticoagulation. Risks outweigh benefits. Family understands risk of future stroke/ thrombus.")  Previous stroke  Prolonged hospitalization from 06/17/14 to 09/11/14  Right hemisphere acute infarcts involving right ACA, left PCA, and left MCA, cardioembolic pattern, pt w/ PAF  Home meds include ASA 81mg    Respiratory Failure  Intubated  Recommend addressing code status prior to extubation  Daughter leaning towards one way extubation without trach   ? Possible seizure  Intermittent, arrhythmic twitching of R hand  Started on Keppra, 1 gm load followed by 500 mg bid  EEG negative for seizure  AcceleratedHypertension   BP 189/118 on arrival  stable  Not on BP meds   Hyperlipidemia  Home meds:  No statin  LDL 79, goal < 70  Add lipitor 10mg  daily  Diabetes  Hypoglycemia over night w/ dextrose given  HgbA1c 4.8, at goal < 7.0  Controlled  Other Stroke Risk Factors  Cigarette smoker  ETOH use  UDS positive for benzos and THC  Obesity, Body mass index is 32 kg/(m^2).   Known PFO  Other Active Problems  ESRD on HD T, TH, S  AAA 3 cm by CT 08/2014  Other Pertinent History  Recent admission for bowel ischemia and resection [facility] 2016; palliative care heavily involved.  Full code  Hospital day # 7  This patient is critically ill due to left MCA infarct, with left M2 occlusion, respiratory failure, Afib, ESRD and at significant risk of neurological worsening, death form recurrent  infarcts, respiratory failure, heart failure, fluid overload. This patient's care requires constant monitoring of vital signs, hemodynamics, respiratory and cardiac monitoring, review of multiple databases, neurological assessment, discussion with family, other specialists and medical decision making of high complexity. I spent 40 minutes of neurocritical care time in the care of this patient.  Marvel Plan, MD PhD Stroke Neurology 11/17/2014 5:03 PM  To contact Stroke Continuity provider, please refer to [email or web address]. After hours, contact General Neurology

## 2014-11-17 NOTE — Progress Notes (Signed)
   11/17/14 0735  Vent Select  Invasive or Noninvasive Invasive  Adult Vent Y  Airway 7.5 mm  Placement Date/Time: 11/10/14 1240   Placed By: ED Physician  Airway Device: Endotracheal Tube  Laryngoscope Blade: MAC;4  ETT Types: Oral  Size (mm): 7.5 mm  Cuffed: Cuffed  Insertion attempts: 1  Airway Equipment: Lighted Stylet;Video Laryngoscope  ...  Secured at (cm) 24 cm  Measured From [employer]  Secured By Commercial Tube Holder  Tube Holder Repositioned Yes  Adult Ventilator Settings  Vent Type Servo i  Humidity HME  Vent Mode PSV  FiO2 (%) 40 %  Pressure Support 14 cmH20  PEEP 5 cmH20  Adult Ventilator Measurements  Peak Airway Pressure 20 L/min  Mean Airway Pressure 9 cmH20  Resp Rate Spontaneous 23 br/min  Resp Rate Total 23 br/min  Exhaled Vt 513 mL  Measured Ve 10.3 mL  Total PEEP 5 cmH20  HOB> 30 Degrees Y  Adult Ventilator Alarms  Alarms On Y  Ve High Alarm 21 L/min  Ve Low Alarm 4 L/min  Resp Rate High Alarm 38 br/min  Resp Rate Low Alarm 10  PEEP Low Alarm 3 cmH2O  Press High Alarm 50 cmH2O  Breath Sounds  Bilateral Breath Sounds Clear;Diminished  Placed patient on PSV 14, peep 5, 35% fi02.  Patient is tolerating well.  RN is aware.

## 2014-11-17 NOTE — Progress Notes (Signed)
[name], female   DOB: 11-25-1955, 59 y.o.   MRN: [id number]  [facility] Progress Note   Assessment/ Plan:   1. Acute CVA/new right-sided hemiparesis: With evidence of moderate sized left MCA infarct s/p tPA. Remains intubated and unable to wean off vent due to inability to protect airway or follow commands-- unfortunately poor prognosis at this point 2.ESRD: hemodialysis today per usual Tuesday/Thursday/Saturday schedule via her left brachiocephalic fistula. 3. 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 5. Nutrition: On high-protein tube feeds via PEG tube 6. Hypertension: Maintaining acceptable blood pressures at this time off midodrine, continue to monitor  Subjective:   No acute events overnight, continue to monitor.    Objective:   BP 121/69 mmHg  Pulse 94  Temp(Src) 98.1 F (36.7 C) (Axillary)  Resp 21  Ht 5' 7.01" (1.702 m)  Wt 92.7 kg (204 lb 5.9 oz)  BMI 32.00 kg/m2  SpO2 97%  Physical Exam: Gen: Intubated, unresponsive CVS: Irregular tachycardia, S1 and S2 normal Resp: Coarse/mechanical breath sounds bilaterally Abd: Soft, obese, nontender Ext: No lower extremity edema-palpable thrill left brachiocephalic fistula  Labs: BMET  Recent Labs Lab 11/10/14 1220 11/10/14 1225 11/11/14 0330 11/13/14 0529 11/14/14 0820 11/16/14 0343 11/17/14 0249  NA 135 134* 136 135 134* 140 137  K 3.4* 3.3* 3.3* 3.8 5.2* 4.4 4.3  CL 97* 97* 97* 98* 101 103 100*  CO2 23  --  21* 21*  GLUCOSE 84 81 80 86 101* 79 93  BUN 25* 28* 27* 41* 68* 95* 119*  CREATININE 5.66* 5.30* 6.05* 6.42* 7.42* 7.63* 8.37*  CALCIUM 8.0*  --  8.3* 8.7* 8.6* 8.8* 8.8*  PHOS  --   --   --  5.3* 5.9*  --  6.3*   CBC  Recent Labs Lab 11/10/14 1220  11/11/14 0330 11/13/14 0323 11/16/14 0343 11/17/14 0249   WBC 6.8  --  8.5 7.4 6.6 7.2  NEUTROABS 4.6  --  6.6  --   --   --   HGB 12.0  < > 12.6 10.7* 10.8* 10.5*  HCT 36.9  < > 38.9 32.6* 33.5* 33.0*  MCV 90.2  --  89.8 88.3 87.0 88.9  PLT 202  --  186 172 179 242  < > = values in this interval not displayed. Medications:    . antiseptic oral rinse  7 mL Mouth Rinse QID  . aspirin  325 mg Oral Daily  . atorvastatin  10 mg Oral q1800  . budesonide  0.25 mg Nebulization BID  . chlorhexidine gluconate  15 mL Mouth Rinse BID  . famotidine  20 mg Per Tube Daily  . feeding supplement (PRO-STAT SUGAR FREE 64)  30 mL Per Tube 5 X Daily  . feeding supplement (VITAL HIGH PROTEIN)  1,000 mL Per Tube Q24H  . ipratropium  0.5 mg Nebulization Q6H  . levalbuterol  0.63 mg Nebulization Q6H  . levETIRAcetam  500 mg Per Tube BID  . levothyroxine  25 mcg Oral QAC breakfast  . pantoprazole (PROTONIX) IV  40 mg Intravenous QHS   Zetta Bills, MD 11/17/2014, 8:08 AM

## 2014-11-17 NOTE — Progress Notes (Signed)
PULMONARY / CRITICAL CARE MEDICINE   Name: [name] MRN: [id number] DOB: 1955-06-25    ADMISSION DATE:  11/10/2014 CONSULTATION DATE:  11/10/2014  REFERRING MD :  Pearlean Brownie  CHIEF COMPLAINT:  Stroke, post TPA  INITIAL PRESENTATION:  This is a 59 y/o female who had an acute left MCA stroke today and received IV TPA.  She was intubated for airway protection in the ER.  She has a long history of ESRD, a recent R ACA stroke, and a recent history of ischemic bowel requiring resection.  Apparently today at dialysis she developed sudden onset of R sided weakness.  She was brought to the ER for further evaluation.  No further history could be obtained due to intubation.  Of note, after TPA she had bleeding from her AV fistula   STUDIES:  8/9 CT head > no acute abnormality 8/9 CT angiogram post tpa> L MCA m2 superior division branch vessel occlusion, moderate irregular narrowing and attenuation of ACA branch vessels, progressed from prior MRA 8/9 admission, IV TPA for L MCA stroke  11/11/14: CT head pending. Neuro Dr Pearlean Brownie discussing goals with dtr. Dtr denied any questions to me. On prn sedation - receieved none - followed some command with delayed response but somnolent  11/13/2014: 11/12/14 Moderate MCAon MRI yesterday.  Still on vent. Family not at bedside. No change noted other than agitation overnight with same during SBT today. Lot of resp sexcretions. RN says dtr - very reluctant for DNR  11/14/14: Dr Pearlean Brownie goals of care - full code (family not buying gloom and doom message based on prior experience). Remains aphasic with right hemiparesis. LE duplex negative for DVT. On HD.    SUBJECTIVE/OVERNIGHT/INTERVAL HX No events overnight, opens eyes but not following commands.  VITAL SIGNS: Temp:  [97.9 F (36.6 C)-98.7 F (37.1 C)] 98.4 F (36.9 C) (08/16 1125) Pulse Rate:  [92-106] 97 (08/16 1000) Resp:  [14-31] 18 (08/16 1000) BP: (117-142)/(69-103) 117/71 mmHg (08/16 1000) SpO2:  [95  %-100 %] 96 % (08/16 1000) FiO2 (%):  [40 %] 40 % (08/16 1000) Weight:  [92.7 kg (204 lb 5.9 oz)] 92.7 kg (204 lb 5.9 oz) (08/16 0500)   HEMODYNAMICS:   VENTILATOR SETTINGS: Vent Mode:  [-] PRVC FiO2 (%):  [40 %] 40 % Set Rate:  [14 bmp] 14 bmp Vt Set:  [570 mL] 570 mL PEEP:  [5 cmH20] 5 cmH20 Pressure Support:  [14 cmH20] 14 cmH20 Plateau Pressure:  [16 cmH20-19 cmH20] 16 cmH20   INTAKE / OUTPUT:  Intake/Output Summary (Last 24 hours) at 11/17/14 1135 Last data filed at 11/17/14 1000  Gross per 24 hour  Intake    850 ml  Output      0 ml  Net    850 ml   PHYSICAL EXAMINATION: General:  Chronically ill appearing, on vent Neuro:  Has weak cough to tracheal suctioning, opens eyes but does not track. Aphasic per neuro. HEENT:  NCAT, ETT in place Cardiovascular:  RRR, systolic murmur noted, left upper arm AV fistula > dressed Lungs:  Vent supported breaths, bilateral, coarse breath sounds, junky sounding, Coughs Abdomen:  BS+, soft, nontender Musculoskeletal:  Diminished tone on my exam Skin:  R IJ perm cath> clean, PEG > clean  LABS: PULMONARY  Recent Labs Lab 11/10/14 1225 11/10/14 1325 11/17/14 0344  PHART  --  7.331* 7.361  PCO2ART  --  52.3* 41.2  PO2ART  --  409.0* 84.2  HCO3  --  27.6* 22.7  TCO2  23 29 24.0  O2SAT  --  100.0 94.8   CBC  Recent Labs Lab 11/13/14 0323 11/16/14 0343 11/17/14 0249  HGB 10.7* 10.8* 10.5*  HCT 32.6* 33.5* 33.0*  WBC 7.4 6.6 7.2  PLT 172 179 242   COAGULATION  Recent Labs Lab 11/10/14 1220  INR 1.29   CARDIAC  No results for input(s): TROPONINI in the last 168 hours. No results for input(s): PROBNP in the last 168 hours.  CHEMISTRY  Recent Labs Lab 11/11/14 0330 11/13/14 0221 11/13/14 0529 11/14/14 0820 11/16/14 0343 11/17/14 0249  NA 136  --  135 134* 140 137  K 3.3*  --  3.8 5.2* 4.4 4.3  CL 97*  --  98* 101 103 100*  CO2 23  --  24 23 21* 21*  GLUCOSE 80  --  86 101* 79 93  BUN 27*  --  41* 68* 95*  119*  CREATININE 6.05*  --  6.42* 7.42* 7.63* 8.37*  CALCIUM 8.3*  --  8.7* 8.6* 8.8* 8.8*  MG  --  1.9  --   --   --  2.1  PHOS  --   --  5.3* 5.9*  --  6.3*   Estimated Creatinine Clearance: 8.6 mL/min (by C-G formula based on Cr of 8.37).  LIVER  Recent Labs Lab 11/10/14 1220 11/14/14 0820 11/16/14 0343  AST 23  --  22  ALT 16  --  11*  ALKPHOS 74  --  67  BILITOT 1.3*  --  0.8  PROT 7.5  --  6.4*  ALBUMIN 3.0* 2.1* 2.1*  INR 1.29  --   --    INFECTIOUS No results for input(s): LATICACIDVEN, PROCALCITON in the last 168 hours.  ENDOCRINE CBG (last 3)   Recent Labs  11/16/14 2327 11/17/14 0342 11/17/14 0737  GLUCAP 94 93 106*   IMAGING x48h   Dg Chest Port 1 View  11/17/2014   CLINICAL DATA:  Intubated patient, acute respiratory failure, chronic renal insufficiency  EXAM: PORTABLE CHEST - 1 VIEW  COMPARISON:  Portable chest x-ray of November 16, 2014  FINDINGS: The left lower lobe remains dense and the left hemidiaphragm obscured. The left upper lobe is clear. The right lung is clear. The interstitial markings of both lungs remain mildly prominent however. The cardiac silhouette is enlarged. The central pulmonary vascularity is prominent. The endotracheal tube tip lies approximately 4 cm above the carina. The dual-lumen dialysis catheter has its tip projecting over the proximal SVC.  IMPRESSION: Persistent left lower lobe atelectasis and/or pleural effusion. Stable mild pulmonary interstitial prominence. No acute change since yesterday's study.   Electronically Signed   By: David  [place] M.D.   On: 11/17/2014 07:46   Dg Chest Port 1 View  11/16/2014   CLINICAL DATA:  Respiratory difficulty  EXAM: PORTABLE CHEST - 1 VIEW  COMPARISON:  11/15/2014  FINDINGS: Tubular device is stable. Left basilar consolidation is stable. The heart is prominent. Right lung is clear. No pneumothorax.  IMPRESSION: Stable left basilar consolidation.   Electronically Signed   By: Jolaine Click M.D.    On: 11/16/2014 07:44   ASSESSMENT / PLAN:  PULMONARY OETT 8/9 >  A:Intubated for airway protection - Acute Resp failure due to stroke \ Weaning on PS trials but airway protection is the primary concern here.  P:   SBT to extubate one way today once family is ready, whether patient protects airway or not no reintubation. Spoke with daughter no trach.  Titrate O2 for sat of 88-92%.  CARDIOVASCULAR  A: Hypertension Pulmonary hypertension by echo> cause uncertain, has not had RHC PFO History of aortic aneurysm  P:  BP management post TPA per neurology. Tele monitoring.  RENAL A:  ESRD RN reports LUE Fistula and RT subclavian tunneled cath  P:   BMET in AM Replace electrolytes as indicated. Renal consult appreciated. Fluids at [facility].  GASTROINTESTINAL A:  Recent ischemic bowel, perforation, treated with surgery in [place] 2016 No acute issues now  P:   TF at goal ongoing. PPI.  HEMATOLOGIC A:  S/p tPA 11/10/2014  P:  Monitor for bleeding CBC in AM Transfuse per ICU protocol  INFECTIOUS A:  No acute issues  P:   Monitor for fever  ENDOCRINE A:  Hypoglycemia - resolved with TF.  P:   Monitor CBGs  NEUROLOGIC A:  Acute L MCA stroke Acute encephalopathy post stroke> med related from intubation? ICH?  P:   D/Ced all sedation.  FAMILY  - Updates: Daughter bedside, no reintubation but they wish for resuscitation.  They are currently discussing it.  The patient is critically ill with multiple organ systems failure and requires high complexity decision making for assessment and support, frequent evaluation and titration of therapies, application of advanced monitoring technologies and extensive interpretation of multiple databases.   Critical Care Time devoted to patient care services described in this note is  35  Minutes. This time reflects time of care of this signee Dr Koren Bound. This critical care time does not reflect procedure time, or teaching time or  supervisory time of PA/NP/Med student/Med Resident etc but could involve care discussion time.  Alyson Reedy, M.D. [facility] Pulmonary/Critical Care Medicine. Pager: [phone]. After hours pager: [phone].  11/17/2014 11:35 AM

## 2014-11-18 LAB — CBC
HCT: 34.4 % — ABNORMAL LOW (ref 36.0–46.0)
HEMOGLOBIN: 11 g/dL — AB (ref 12.0–15.0)
MCH: 28.7 pg (ref 26.0–34.0)
MCHC: 32 g/dL (ref 30.0–36.0)
MCV: 89.8 fL (ref 78.0–100.0)
PLATELETS: 262 10*3/uL (ref 150–400)
RBC: 3.83 MIL/uL — AB (ref 3.87–5.11)
RDW: 18.8 % — ABNORMAL HIGH (ref 11.5–15.5)
WBC: 7.5 10*3/uL (ref 4.0–10.5)

## 2014-11-18 LAB — GLUCOSE, CAPILLARY
GLUCOSE-CAPILLARY: 94 mg/dL (ref 65–99)
GLUCOSE-CAPILLARY: 91 mg/dL (ref 65–99)
Glucose-Capillary: 109 mg/dL — ABNORMAL HIGH (ref 65–99)
Glucose-Capillary: 87 mg/dL (ref 65–99)
Glucose-Capillary: 97 mg/dL (ref 65–99)
Glucose-Capillary: 99 mg/dL (ref 65–99)

## 2014-11-18 LAB — BASIC METABOLIC PANEL
ANION GAP: 11 (ref 5–15)
BUN: 62 mg/dL — ABNORMAL HIGH (ref 6–20)
CO2: 28 mmol/L (ref 22–32)
Calcium: 8.6 mg/dL — ABNORMAL LOW (ref 8.9–10.3)
Chloride: 101 mmol/L (ref 101–111)
Creatinine, Ser: 5.19 mg/dL — ABNORMAL HIGH (ref 0.44–1.00)
GFR calc Af Amer: 10 mL/min — ABNORMAL LOW (ref >60)
GFR, EST NON AFRICAN AMERICAN: 8 mL/min — AB (ref >60)
Glucose, Bld: 99 mg/dL (ref 65–99)
POTASSIUM: 3.5 mmol/L (ref 3.5–5.1)
SODIUM: 140 mmol/L (ref 135–145)

## 2014-11-18 LAB — PHOSPHORUS: Phosphorus: 3.9 mg/dL (ref 2.5–4.6)

## 2014-11-18 LAB — MAGNESIUM: MAGNESIUM: 1.9 mg/dL (ref 1.7–2.4)

## 2014-11-18 NOTE — Progress Notes (Signed)
Explained to daughter that DNR does not mean do not treat.  And that if we are not going to reintubate attempting CPR is futile.  She is unable to comprehend that concept and insists that mother be DNI only.  Will make DNI then retry explaining once patient is extubated and make DNR.  Alyson Reedy, M.D. [facility] Pulmonary/Critical Care Medicine. Pager: [phone]. After hours pager: [phone].

## 2014-11-18 NOTE — Progress Notes (Signed)
STROKE TEAM PROGRESS NOTE   HISTORY [name] is an 59 y.o. female who was at dialysis and finished her session. After dialysis while still at the center she was noted to have sudden onset right sided weakness, left gaze deviation (LKW 11/10/2014 at 1106). EMS was called and on site BG was noted to be 50. Patient was given D50 and was transported to [facility] ED. On arrival she had labored respirations was not protecting her airway, and required intubation. CT of head was delayed due to need for intubation. CT head obtained and showed no acte stroke or bleed. Patient was deemed to be a candidate for TPA and was brought back to the ED. Dialysis needles were still present in the patient's AV fistula. IV team was consulted and removed the hemodialysis needles as well as achieved hemostasis. IV tPA was then administered. CT angiogram of the head and neck was obtained which showed no large vessel proximal occlusion. She was subsequently admitted to the neuro intensive care unit for further management.    SUBJECTIVE (INTERVAL HISTORY) She is still intutbated. More awake and open eyes spontaneously and starts to track objects but still not following commands, likely due to aphasia. Family is to decide on one way extubation and they request DNI.   OBJECTIVE Temp:  [98 F (36.7 C)-98.7 F (37.1 C)] 98.7 F (37.1 C) (08/17 1205) Pulse Rate:  [96-111] 104 (08/17 1400) Cardiac Rhythm:  [-] Sinus tachycardia (08/17 1200) Resp:  [13-29] 20 (08/17 1400) BP: (90-139)/(55-89) 135/86 mmHg (08/17 1400) SpO2:  [94 %-100 %] 97 % (08/17 1400) FiO2 (%):  [40 %] 40 % (08/17 1400) Weight:  [197 lb 12 oz (89.7 kg)-198 lb 6.6 oz (90 kg)] 198 lb 6.6 oz (90 kg) (08/17 0500)   Recent Labs Lab 11/17/14 2003 11/17/14 2335 11/18/14 0348 11/18/14 0758 11/18/14 1203  GLUCAP 97 92 87 99 109*    Recent Labs Lab 11/13/14 0221  11/13/14 0529 11/14/14 0820 11/16/14 0343 11/17/14 0249 11/18/14 0217  NA  --   --  135  134* 140 137 140  K  --   --  3.8 5.2* 4.4 4.3 3.5  CL  --   --  98* 101 103 100* 101  CO2  --   --  24 23 21* 21* 28  GLUCOSE  --   --  86 101* 79 93 99  BUN  --   --  41* 68* 95* 119* 62*  CREATININE  --   --  6.42* 7.42* 7.63* 8.37* 5.19*  CALCIUM  --   < > 8.7* 8.6* 8.8* 8.8* 8.6*  MG 1.9  --   --   --   --  2.1 1.9  PHOS  --   --  5.3* 5.9*  --  6.3* 3.9  < > = values in this interval not displayed.  Recent Labs Lab 11/14/14 0820 11/16/14 0343  AST  --  22  ALT  --  11*  ALKPHOS  --  67  BILITOT  --  0.8  PROT  --  6.4*  ALBUMIN 2.1* 2.1*    Recent Labs Lab 11/13/14 0323 11/16/14 0343 11/17/14 0249 11/18/14 0217  WBC 7.4 6.6 7.2 7.5  HGB 10.7* 10.8* 10.5* 11.0*  HCT 32.6* 33.5* 33.0* 34.4*  MCV 88.3 87.0 88.9 89.8  PLT 172 179 242 262   No results for input(s): CKTOTAL, CKMB, CKMBINDEX, TROPONINI in the last 168 hours. No results for input(s): LABPROT, INR in the  last 72 hours. No results for input(s): COLORURINE, LABSPEC, PHURINE, GLUCOSEU, HGBUR, BILIRUBINUR, KETONESUR, PROTEINUR, UROBILINOGEN, NITRITE, LEUKOCYTESUR in the last 72 hours.  Invalid input(s): APPERANCEUR     Component Value Date/Time   CHOL 135 11/11/2014 0330   TRIG 95 11/11/2014 0330   HDL 37* 11/11/2014 0330   CHOLHDL 3.6 11/11/2014 0330   VLDL 19 11/11/2014 0330   LDLCALC 79 11/11/2014 0330   Lab Results  Component Value Date   HGBA1C 4.8 11/11/2014      Component Value Date/Time   LABOPIA NONE DETECTED 11/10/2014 1336   COCAINSCRNUR NONE DETECTED 11/10/2014 1336   LABBENZ POSITIVE* 11/10/2014 1336   AMPHETMU NONE DETECTED 11/10/2014 1336   THCU POSITIVE* 11/10/2014 1336   LABBARB NONE DETECTED 11/10/2014 1336    No results for input(s): ETH in the last 168 hours.   IMAGING I have personally reviewed the radiological images below and agree with the radiology interpretations.  MRI Brain without contrast 11/12/2014 1. Moderate size left MCA territory infarct affecting the  left insula and operculum. No associated hemorrhage or mass effect. 2. Expected evolution of the posterior right ACA and posterior left MCA infarcts seen in March.  Ct Head Wo Contrast 11/10/2014    No evidence for post t-PA hemorrhage. Early cytotoxic edema now noted in the LEFT hemisphere.    11/10/2014   1. No acute intracranial pathology.     Ct Angio Head & Neck W/cm &/or Wo/cm 11/10/2014    1. 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  2. No proximal large vessel occlusion.  3. Moderate irregular narrowing and attenuation of ACA branch vessels, progressed from prior MRA.  4. Mild-to-moderate plaque in the cervical carotid arteries without stenosis.  5. Mild proximal left vertebral artery stenosis.    Portable Chest Xray 11/11/2014    1. Lines and tubes in stable position. 2. New onset of dense left lower lobe atelectasis and consolidation. Small left pleural effusion.    11/10/2014   Endotracheal tube terminates 3 cm above the carina.     EEG : mild global cerebral dysfunction as seen in a wide variety of toxic metabolic encephalopathies and degenerative brain disorders. No electrographic seizures noted.  2D-Echo 11/12/14 - Left ventricle: The cavity size was moderately reduced. Wall thickness was increased in a pattern of severe LVH. Systolic function was normal. The estimated ejection fraction was in the range of 60% to 65%. Wall motion was normal; there were no regional wall motion abnormalities. - Right ventricle: The cavity size was mildly dilated. Systolic function was mildly reduced. - Right atrium: The atrium was severely dilated. - Tricuspid valve: There was severe regurgitation. - Pulmonary arteries: Systolic pressure was severely increased. PA peak pressure: 86 mm Hg (S). - Pericardium, extracardiac: A small pericardial effusion was identified circumferential to the heart. There was no evidence of hemodynamic compromise.   PHYSICAL  EXAM  Temp:  [98 F (36.7 C)-98.7 F (37.1 C)] 98.7 F (37.1 C) (08/17 1205) Pulse Rate:  [96-111] 104 (08/17 1400) Resp:  [13-29] 20 (08/17 1400) BP: (90-139)/(55-89) 135/86 mmHg (08/17 1400) SpO2:  [94 %-100 %] 97 % (08/17 1400) FiO2 (%):  [40 %] 40 % (08/17 1400) Weight:  [197 lb 12 oz (89.7 kg)-198 lb 6.6 oz (90 kg)] 198 lb 6.6 oz (90 kg) (08/17 0500)  General - obese, well developed, intubated on vent.  Ophthalmologic - Fundi not visualized due to noncooperation.  Cardiovascular - Regular rate and rhythm.  Neuro - intubated on  vent, more awake and open eyes spontaneously, but still not following commands. PERRL, able to track objects intermittently, but still left gaze preference but able to cross midline intermittently, doll's eye present, corneal, gag and cough present. RUE 0/5 proximal but 2+/5 distally, LUE localize to pain and against gravity, LLE 2/5 on pain, and RLE 2/5 on pain, b/l unsustained ankle clonus. Increased muscle tone RUE and BLE. Babinski mute bilaterally. Sensory, coordination and gait not tested.  ASSESSMENT: [name] is a 59 y.o. female with history of HTN, ESRD on HD, SVT, PFO, previous stroke, COPD, AAA, COPD and recent bowel ischemia leading to resection admitted for right sided weakness, left gaze deviation. She received IV t-PA 11/10/2014 at 1340.   Stroke:  Dominant left MCA infarct embolic secondary to known atrial fibrillation   Resultant  VDRF, right hemiparesis  MRI  Moderate size left MCA territory infarct.  CTA head & neck L M2 branch occlusion.   2D Echo EF 60-65%   TEE 06/23/2014 PFO. Severe RV dysfunction, RAE, TR. Incessant atrial tachycardia  LE venous dopplers negative for DVT  LDL 79  HgbA1c 4.8  SCDs for VTE prophylaxis (listed heparin antibody positive, not able to do arixtra due to ESRD) Diet NPO time specified  aspirin 81 mg orally every day prior to admission, now on 300 mg rectal. Family requested one way  extubation but asking for DNI  Ongoing aggressive stroke risk factor management  Continued need for ICU level care  Therapy recommendations:  pending   Disposition:  pending   Paroxysmal Atrial Fibrillation  Documented on tele 07/27/2014  CHADS-VASc is 4 (stroke, HTN, Female).   Decided not an anticoagulation candidate at that time as risk outweighs benefits given multiple medical issues (see note 07/28/2014 from Dr. Anne Fu -"Ultimately deserves anticoagulation (coumadin). Given her multitude of co morbidities (including fistula) she would not be an optimal candidate for anticoagulation. Risks outweigh benefits. Family understands risk of future stroke/ thrombus.")  May reconsider anticoagulation if family aggressive and pt medical condition improves  Previous stroke  Prolonged hospitalization from 06/17/14 to 09/11/14  Right hemisphere acute infarcts involving right ACA, left PCA, and left MCA, cardioembolic pattern, pt w/ PAF  Home meds include ASA 81mg    Respiratory Failure  Intubated  Recommend addressing code status prior to extubation  Daughter leaning towards one way extubation without trach   ? Possible seizure  Intermittent, arrhythmic twitching of R hand  Started on Keppra, 1 gm load followed by 500 mg bid  EEG negative for seizure  AcceleratedHypertension   BP 189/118 on arrival  stable  Not on BP meds   Hyperlipidemia  Home meds:  No statin  LDL 79, goal < 70  Add lipitor 10mg  daily  Diabetes  Hypoglycemia over night w/ dextrose given  HgbA1c 4.8, at goal < 7.0  Controlled  Other Stroke Risk Factors  Cigarette smoker  ETOH use  UDS positive for benzos and THC  Obesity, Body mass index is 31.07 kg/(m^2).   Known PFO  Other Active Problems  ESRD on HD T, TH, S  AAA 3 cm by CT 08/2014  Other Pertinent History  Recent admission for bowel ischemia and resection [place] 2016; palliative care heavily involved.  DNI now  Hospital  day # 8  This patient is critically ill due to left MCA infarct, with left M2 occlusion, respiratory failure, Afib, ESRD and at significant risk of neurological worsening, death form recurrent infarcts, respiratory failure, heart failure, fluid overload.  This patient's care requires constant monitoring of vital signs, hemodynamics, respiratory and cardiac monitoring, review of multiple databases, neurological assessment, discussion with family, other specialists and medical decision making of high complexity. I spent 35 minutes of neurocritical care time in the care of this patient.  Marvel Plan, MD PhD Stroke Neurology 11/18/2014 3:05 PM  To contact Stroke Continuity provider, please refer to [email or web address]. After hours, contact General Neurology

## 2014-11-18 NOTE — Progress Notes (Signed)
[name], female   DOB: 14-Oct-1955, 59 y.o.   MRN: [id number]  [facility] Progress Note   Assessment/ Plan:   1. Acute CVA/new right-sided hemiparesis: With moderate sized left MCA infarct s/p tPA. Remains intubated and unable to wean off vent due to inability to protect airway. It appears that discussions underway with family for "one way" extubation when family is ready as tracheostomy is undesired. Currently with efforts at risk modification. 2.ESRD: Hemodialysis done yesterday via left BCF without problems-schedule for next hemodialysis tomorrow (no acute needs at this time, will exercise caution with ultrafiltration given intermittent hypotension). 3. 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 5. Nutrition: On high-protein tube feeds via PEG tube 6. Hypertension: Labile blood pressures noted but appear to be acceptable at this point on midodrine.  Subjective:   Had some transient hypotension overnight with an blood pressures improved this morning.    Objective:   BP 90/55 mmHg  Pulse 97  Temp(Src) 98.5 F (36.9 C) (Axillary)  Resp 20  Ht 5' 7.01" (1.702 m)  Wt 90 kg (198 lb 6.6 oz)  BMI 31.07 kg/m2  SpO2 100%  Physical Exam: Gen: Intubated, awakens to voice and attempts to follow commands (partially squeeze his left hand) CVS: Irregularly irregular, S1 and S2 normal Resp: Coarse/mechanical breath sounds bilaterally Abd: Soft, obese, nontender Ext: No lower extremity edema-palpable thrill left brachiocephalic fistula  Labs: BMET  Recent Labs Lab 11/13/14 0529 11/14/14 0820 11/16/14 0343 11/17/14 0249 11/18/14 0217  NA 135 134* 140 137 140  K 3.8 5.2* 4.4 4.3 3.5  CL 98* 101 103 100* 101  CO2 24 23 21* 21* 28  GLUCOSE 86 101* 79 93 99  BUN 41* 68* 95* 119* 62*  CREATININE 6.42* 7.42* 7.63*  8.37* 5.19*  CALCIUM 8.7* 8.6* 8.8* 8.8* 8.6*  PHOS 5.3* 5.9*  --  6.3* 3.9   CBC  Recent Labs Lab 11/13/14 0323 11/16/14 0343 11/17/14 0249 11/18/14 0217  WBC 7.4 6.6 7.2 7.5  HGB 10.7* 10.8* 10.5* 11.0*  HCT 32.6* 33.5* 33.0* 34.4*  MCV 88.3 87.0 88.9 89.8  PLT 172 179 242 262   Medications:    . antiseptic oral rinse  7 mL Mouth Rinse QID  . aspirin  325 mg Oral Daily  . atorvastatin  10 mg Oral q1800  . budesonide  0.25 mg Nebulization BID  . chlorhexidine gluconate  15 mL Mouth Rinse BID  . famotidine  20 mg Per Tube Daily  . feeding supplement (PRO-STAT SUGAR FREE 64)  30 mL Per Tube 5 X Daily  . feeding supplement (VITAL HIGH PROTEIN)  1,000 mL Per Tube Q24H  . ipratropium  0.5 mg Nebulization Q6H  . lanthanum  500 mg Per Tube TID  . levalbuterol  0.63 mg Nebulization Q6H  . levETIRAcetam  500 mg Per Tube BID  . levothyroxine  25 mcg Oral QAC breakfast  . pantoprazole (PROTONIX) IV  40 mg Intravenous QHS   Zetta Bills, MD 11/18/2014, 8:13 AM

## 2014-11-18 NOTE — Progress Notes (Signed)
PULMONARY / CRITICAL CARE MEDICINE   Name: [name] MRN: [id number] DOB: 1955/12/27    ADMISSION DATE:  11/10/2014 CONSULTATION DATE:  11/10/2014  REFERRING MD :  Pearlean Brownie  CHIEF COMPLAINT:  Stroke, post TPA  INITIAL PRESENTATION:  This is a 59 y/o female who had an acute left MCA stroke today and received IV TPA.  She was intubated for airway protection in the ER.  She has a long history of ESRD, a recent R ACA stroke, and a recent history of ischemic bowel requiring resection.  Apparently today at dialysis she developed sudden onset of R sided weakness.  She was brought to the ER for further evaluation.  No further history could be obtained due to intubation.  Of note, after TPA she had bleeding from her AV fistula   STUDIES:  8/9 CT head > no acute abnormality 8/9 CT angiogram post tpa> L MCA m2 superior division branch vessel occlusion, moderate irregular narrowing and attenuation of ACA branch vessels, progressed from prior MRA 8/9 admission, IV TPA for L MCA stroke  11/11/14: CT head pending. Neuro Dr Pearlean Brownie discussing goals with dtr. Dtr denied any questions to me. On prn sedation - receieved none - followed some command with delayed response but somnolent  11/13/2014: 11/12/14 Moderate MCAon MRI yesterday.  Still on vent. Family not at bedside. No change noted other than agitation overnight with same during SBT today. Lot of resp sexcretions. RN says dtr - very reluctant for DNR  11/14/14: Dr Pearlean Brownie goals of care - full code (family not buying gloom and doom message based on prior experience). Remains aphasic with right hemiparesis. LE duplex negative for DVT. On HD.    SUBJECTIVE/OVERNIGHT/INTERVAL HX No events overnight, opens eyes but not following commands.  VITAL SIGNS: Temp:  [98 F (36.7 C)-98.7 F (37.1 C)] 98.7 F (37.1 C) (08/17 1205) Pulse Rate:  [96-111] 105 (08/17 1200) Resp:  [13-29] 22 (08/17 1200) BP: (90-139)/(55-89) 125/73 mmHg (08/17 1200) SpO2:  [94 %-100  %] 98 % (08/17 1200) FiO2 (%):  [40 %] 40 % (08/17 1200) Weight:  [89.7 kg (197 lb 12 oz)-90 kg (198 lb 6.6 oz)] 90 kg (198 lb 6.6 oz) (08/17 0500)   HEMODYNAMICS:   VENTILATOR SETTINGS: Vent Mode:  [-] PSV FiO2 (%):  [40 %] 40 % Set Rate:  [14 bmp] 14 bmp Vt Set:  [500 mL-570 mL] 500 mL PEEP:  [5 cmH20] 5 cmH20 Pressure Support:  [5 cmH20-8 cmH20] 5 cmH20 Plateau Pressure:  [18 cmH20-19 cmH20] 19 cmH20   INTAKE / OUTPUT:  Intake/Output Summary (Last 24 hours) at 11/18/14 1317 Last data filed at 11/18/14 1200  Gross per 24 hour  Intake    940 ml  Output   2000 ml  Net  -1060 ml   PHYSICAL EXAMINATION: General:  Chronically ill appearing, on vent Neuro:  Has weak cough to tracheal suctioning, opens eyes but does not track. Aphasic per neuro. HEENT:  NCAT, ETT in place Cardiovascular:  RRR, systolic murmur noted, left upper arm AV fistula > dressed Lungs:  Vent supported breaths, bilateral, coarse breath sounds, junky sounding, Coughs Abdomen:  BS+, soft, nontender Musculoskeletal:  Diminished tone on my exam Skin:  R IJ perm cath> clean, PEG > clean  LABS: PULMONARY  Recent Labs Lab 11/17/14 0344  PHART 7.361  PCO2ART 41.2  PO2ART 84.2  HCO3 22.7  TCO2 24.0  O2SAT 94.8   CBC  Recent Labs Lab 11/16/14 0343 11/17/14 0249 11/18/14 0217  HGB 10.8* 10.5* 11.0*  HCT 33.5* 33.0* 34.4*  WBC 6.6 7.2 7.5  PLT 179 242 262   COAGULATION No results for input(s): INR in the last 168 hours. CARDIAC  No results for input(s): TROPONINI in the last 168 hours. No results for input(s): PROBNP in the last 168 hours.  CHEMISTRY  Recent Labs Lab 11/13/14 0221  11/13/14 0529 11/14/14 0820 11/16/14 0343 11/17/14 0249 11/18/14 0217  NA  --   --  135 134* 140 137 140  K  --   < > 3.8 5.2* 4.4 4.3 3.5  CL  --   --  98* 101 103 100* 101  CO2  --   --  24 23 21* 21* 28  GLUCOSE  --   --  86 101* 79 93 99  BUN  --   --  41* 68* 95* 119* 62*  CREATININE  --   --  6.42*  7.42* 7.63* 8.37* 5.19*  CALCIUM  --   --  8.7* 8.6* 8.8* 8.8* 8.6*  MG 1.9  --   --   --   --  2.1 1.9  PHOS  --   --  5.3* 5.9*  --  6.3* 3.9  < > = values in this interval not displayed. Estimated Creatinine Clearance: 13.6 mL/min (by C-G formula based on Cr of 5.19).  LIVER  Recent Labs Lab 11/14/14 0820 11/16/14 0343  AST  --  22  ALT  --  11*  ALKPHOS  --  67  BILITOT  --  0.8  PROT  --  6.4*  ALBUMIN 2.1* 2.1*   INFECTIOUS No results for input(s): LATICACIDVEN, PROCALCITON in the last 168 hours.  ENDOCRINE CBG (last 3)   Recent Labs  11/18/14 0348 11/18/14 0758 11/18/14 1203  GLUCAP 87 99 109*   IMAGING x48h   Dg Chest Port 1 View  11/17/2014   CLINICAL DATA:  Intubated patient, acute respiratory failure, chronic renal insufficiency  EXAM: PORTABLE CHEST - 1 VIEW  COMPARISON:  Portable chest x-ray of November 16, 2014  FINDINGS: The left lower lobe remains dense and the left hemidiaphragm obscured. The left upper lobe is clear. The right lung is clear. The interstitial markings of both lungs remain mildly prominent however. The cardiac silhouette is enlarged. The central pulmonary vascularity is prominent. The endotracheal tube tip lies approximately 4 cm above the carina. The dual-lumen dialysis catheter has its tip projecting over the proximal SVC.  IMPRESSION: Persistent left lower lobe atelectasis and/or pleural effusion. Stable mild pulmonary interstitial prominence. No acute change since yesterday's study.   Electronically Signed   By: David  [place] M.D.   On: 11/17/2014 07:46   ASSESSMENT / PLAN:  PULMONARY OETT 8/9 >  A:Intubated for airway protection - Acute Resp failure due to stroke \ Weaning on PS trials but airway protection is the primary concern here.  P:   SBT to extubate one way when family is ready, likely in AM. Spoke with daughter no trach and explained why DNR is necessary if we are doing a one way extubation, she is discussing it with her aunt  and will let us know today regardless DNR and likely extubation in AM. Titrate O2 for sat of 88-92%.  CARDIOVASCULAR  A: Hypertension Pulmonary hypertension by echo> cause uncertain, has not had RHC PFO History of aortic aneurysm  P:  BP management post TPA per neurology. Tele monitoring until off vent.  RENAL A:  ESRD RN reports LUE Fistula  and RT subclavian tunneled cath  P:   BMET in AM. Replace electrolytes as indicated. Renal consult appreciated. Fluids at [facility].  GASTROINTESTINAL A:  Recent ischemic bowel, perforation, treated with surgery in [place] 2016 No acute issues now  P:   TF at goal ongoing. PPI.  HEMATOLOGIC A:  S/p tPA 11/10/2014  P:  Monitor for bleeding. D/C further blood draws.  INFECTIOUS A:  No acute issues  P:   Monitor for fever.  ENDOCRINE A:  Hypoglycemia - resolved with TF.  P:   D/C CBGs once extubated.  NEUROLOGIC A:  Acute L MCA stroke Acute encephalopathy post stroke> med related from intubation? ICH?  P:   D/Ced all sedation.  FAMILY  - Updates: Daughter bedside, no reintubation but they wish for resuscitation.  They are currently discussing it.  The patient is critically ill with multiple organ systems failure and requires high complexity decision making for assessment and support, frequent evaluation and titration of therapies, application of advanced monitoring technologies and extensive interpretation of multiple databases.   Critical Care Time devoted to patient care services described in this note is  35  Minutes. This time reflects time of care of this signee Dr Koren Bound. This critical care time does not reflect procedure time, or teaching time or supervisory time of PA/NP/Med student/Med Resident etc but could involve care discussion time.  Alyson Reedy, M.D. [facility] Pulmonary/Critical Care Medicine. Pager: [phone]. After hours pager: [phone].  11/18/2014 1:17 PM

## 2014-11-18 NOTE — Progress Notes (Signed)
Nutrition Follow-up  DOCUMENTATION CODES:   Obesity unspecified  INTERVENTION:   Continue Vital High Protein @ 30 ml/hr Continue 30 ml Prostat five times per day  Tube feeding regimen provides 1220 kcal, 138 grams of protein, and 601 ml of H2O.   NUTRITION DIAGNOSIS:   Inadequate oral intake related to inability to eat as evidenced by NPO status.  ongoing  GOAL:   Provide needs based on ASPEN/SCCM guidelines  Met.   MONITOR:   TF tolerance, Vent status, Labs, I & O's   ASSESSMENT:   This is a 59 y/o female who had an acute left MCA stroke today and received IV TPA. She was intubated for airway protection in the ER. She has a long history of ESRD, a recent R ACA stroke, and a recent history of ischemic bowel requiring resection.  Patient is currently intubated on ventilator support MV: 9.3 L/min Temp (24hrs), Avg:98.5 F (36.9 C), Min:98 F (36.7 C), Max:98.7 F (37.1 C)  Pt discussed during ICU rounds and with RN.  Per staff pt will likely be extubated 8/18 with orders for DNI. MD will continue to discuss DNR with family.  Labs reviewed: cbg 87-109 Potassium, Magnesium, and Phosphorus are WNL   Diet Order:  Diet NPO time specified  Skin:  Reviewed, no issues  Last BM:  8/17  Height:   Ht Readings from Last 1 Encounters:  11/11/14 5' 7.01" (1.702 m)    Weight:   Wt Readings from Last 1 Encounters:  11/18/14 198 lb 6.6 oz (90 kg)    Ideal Body Weight:  67.2 kg  BMI:  Body mass index is 31.07 kg/(m^2).  Estimated Nutritional Needs:   Kcal:  1610-9604  Protein:  > 134 grams  Fluid:  >/= 1.2 L/day  EDUCATION NEEDS:   No education needs identified at this time  [place], [place], [place] Pager [phone] After Hours Pager

## 2014-11-19 LAB — GLUCOSE, CAPILLARY
GLUCOSE-CAPILLARY: 94 mg/dL (ref 65–99)
GLUCOSE-CAPILLARY: 92 mg/dL (ref 65–99)
Glucose-Capillary: 102 mg/dL — ABNORMAL HIGH (ref 65–99)
Glucose-Capillary: 97 mg/dL (ref 65–99)
Glucose-Capillary: 102 mg/dL — ABNORMAL HIGH (ref 65–99)

## 2014-11-19 LAB — RENAL FUNCTION PANEL
ANION GAP: 12 (ref 5–15)
Albumin: 2 g/dL — ABNORMAL LOW (ref 3.5–5.0)
BUN: 99 mg/dL — ABNORMAL HIGH (ref 6–20)
CALCIUM: 8.8 mg/dL — AB (ref 8.9–10.3)
CO2: 27 mmol/L (ref 22–32)
CREATININE: 6.89 mg/dL — AB (ref 0.44–1.00)
Chloride: 102 mmol/L (ref 101–111)
GFR, EST AFRICAN AMERICAN: 7 mL/min — AB (ref >60)
GFR, EST NON AFRICAN AMERICAN: 6 mL/min — AB (ref >60)
Glucose, Bld: 93 mg/dL (ref 65–99)
PHOSPHORUS: 5.8 mg/dL — AB (ref 2.5–4.6)
Potassium: 5 mmol/L (ref 3.5–5.1)
SODIUM: 141 mmol/L (ref 135–145)

## 2014-11-19 MED ORDER — PANTOPRAZOLE SODIUM 40 MG PO PACK
40.0000 mg | PACK | Freq: Every day | ORAL | Status: DC
  Administered 2014-11-19 – 2014-11-25 (×6): 40 mg
  Filled 2014-11-19 (×7): qty 20

## 2014-11-19 NOTE — Procedures (Signed)
Patient seen on Hemodialysis. QB 400, UF goal 2L Treatment adjusted as needed.  Sharma Lawrance MD [facility]. Office # [phone] Pager # [phone] 8:09 AM  

## 2014-11-19 NOTE — Progress Notes (Signed)
Patient ID: Clovis C Mcgriff, female   DOB: Celina 29, 1957, 59 y.o.   MRN: [id number]  [facility] Progress Note   Assessment/ Plan:   1. Acute CVA/new right-sided hemiparesis: With moderate sized left MCA infarct s/p tPA with some bleed. Remains intubated and unable to wean off vent due to inability to protect airway. CCM leading discussion with the family regarding goals of care. 2.ESRD: HD today per TTS schedule-- judicious UF with intermittent hypotension 3. 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 5. Nutrition: On high-protein tube feeds via PEG tube 6. Hypertension: Labile blood pressures noted but appear to be acceptable at this point on midodrine.  Subjective:   No acute events overnight.    Objective:   BP 108/69 mmHg  Pulse 99  Temp(Src) 97.8 F (36.6 C) (Axillary)  Resp 20  Ht 5' 7.01" (1.702 m)  Wt 89.1 kg (196 lb 6.9 oz)  BMI 30.76 kg/m2  SpO2 98%  Physical Exam: Gen: Intubated, awake and appears to focus on me as I talk. On HD CVS: Irregularly irregular, S1 and S2 normal Resp: Coarse/mechanical breath sounds bilaterally Abd: Soft, obese, nontender Ext: No lower extremity edema-palpable thrill left brachiocephalic fistula. Poor tone right hand/leg  Labs: BMET  Recent Labs Lab 11/13/14 0529 11/14/14 0820 11/16/14 0343 11/17/14 0249 11/18/14 0217  NA 135 134* 140 137 140  K 3.8 5.2* 4.4 4.3 3.5  CL 98* 101 103 100* 101  CO2 24 23 21* 21* 28  GLUCOSE 86 101* 79 93 99  BUN 41* 68* 95* 119* 62*  CREATININE 6.42* 7.42* 7.63* 8.37* 5.19*  CALCIUM 8.7* 8.6* 8.8* 8.8* 8.6*  PHOS 5.3* 5.9*  --  6.3* 3.9   CBC  Recent Labs Lab 11/13/14 0323 11/16/14 0343 11/17/14 0249 11/18/14 0217  WBC 7.4 6.6 7.2 7.5  HGB 10.7* 10.8* 10.5* 11.0*  HCT 32.6* 33.5* 33.0* 34.4*  MCV 88.3 87.0 88.9 89.8  PLT 172 179 242  262   Medications:    . antiseptic oral rinse  7 mL Mouth Rinse QID  . aspirin  325 mg Oral Daily  . atorvastatin  10 mg Oral q1800  . budesonide  0.25 mg Nebulization BID  . chlorhexidine gluconate  15 mL Mouth Rinse BID  . famotidine  20 mg Per Tube Daily  . feeding supplement (PRO-STAT SUGAR FREE 64)  30 mL Per Tube 5 X Daily  . feeding supplement (VITAL HIGH PROTEIN)  1,000 mL Per Tube Q24H  . ipratropium  0.5 mg Nebulization Q6H  . lanthanum  500 mg Per Tube TID  . levalbuterol  0.63 mg Nebulization Q6H  . levETIRAcetam  500 mg Per Tube BID  . levothyroxine  25 mcg Oral QAC breakfast  . pantoprazole sodium  40 mg Per Tube Q1200   Zetta Bills, MD 11/19/2014, 8:05 AM

## 2014-11-19 NOTE — Progress Notes (Signed)
PULMONARY / CRITICAL CARE MEDICINE   Name: [name] MRN: [id number] DOB: 10-Feb-1956    ADMISSION DATE:  11/10/2014 CONSULTATION DATE:  11/10/2014  REFERRING MD :  Pearlean Brownie  CHIEF COMPLAINT:  Stroke, post TPA  INITIAL PRESENTATION:  This is a 59 y/o female who had an acute left MCA stroke 8/9 and received IV TPA.  She was intubated for airway protection in the ER.  She has a long history of ESRD, a recent R ACA stroke, and a recent history of ischemic bowel requiring resection.  On 8/9  at dialysis she developed sudden onset of R sided weakness.  She was brought to the ER for further evaluation.  No further history could be obtained due to intubation.  Of note, after TPA she had bleeding from her AV fistula   STUDIES:  8/9 CT head > no acute abnormality 8/9 CT angiogram post tpa> L MCA m2 superior division branch vessel occlusion, moderate irregular narrowing and attenuation of ACA branch vessels, progressed from prior MRA 8/9 admission, IV TPA for L MCA stroke  11/11/14: CT head pending. Neuro Dr Pearlean Brownie discussing goals with dtr. Dtr denied any questions to me. On prn sedation - receieved none - followed some command with delayed response but somnolent  11/13/2014: 11/12/14 Moderate MCAon MRI yesterday.  Still on vent. Family not at bedside. No change noted other than agitation overnight with same during SBT today. Lot of resp sexcretions. RN says dtr - very reluctant for DNR  11/14/14: Dr Pearlean Brownie goals of care - full code (family not buying gloom and doom message based on prior experience). Remains aphasic with right hemiparesis. LE duplex negative for DVT. On HD.    SUBJECTIVE/OVERNIGHT/INTERVAL HX HD being started, opens eyes but not following commands.  VITAL SIGNS: Temp:  [97.8 F (36.6 C)-99.1 F (37.3 C)] 97.8 F (36.6 C) (08/18 0400) Pulse Rate:  [96-110] 99 (08/18 0700) Resp:  [15-28] 20 (08/18 0700) BP: (106-139)/(61-89) 108/69 mmHg (08/18 0700) SpO2:  [95 %-100 %] 98 %  (08/18 0700) FiO2 (%):  [40 %] 40 % (08/18 0317) Weight:  [196 lb 6.9 oz (89.1 kg)] 196 lb 6.9 oz (89.1 kg) (08/18 0500)   HEMODYNAMICS:   VENTILATOR SETTINGS: Vent Mode:  [-] PRVC FiO2 (%):  [40 %] 40 % Set Rate:  [14 bmp] 14 bmp Vt Set:  [500 mL] 500 mL PEEP:  [5 cmH20] 5 cmH20 Pressure Support:  [5 cmH20] 5 cmH20 Plateau Pressure:  [15 cmH20-17 cmH20] 17 cmH20   INTAKE / OUTPUT:  Intake/Output Summary (Last 24 hours) at 11/19/14 0758 Last data filed at 11/19/14 0700  Gross per 24 hour  Intake    960 ml  Output      0 ml  Net    960 ml   PHYSICAL EXAMINATION: General:  Chronically ill appearing, on vent Neuro:  Has weak cough to tracheal suctioning, opens eyes but does not track. Aphasic per neuro. HEENT:  NCAT, ETT in place Cardiovascular:  RRR, systolic murmur noted, left upper arm AV fistula > dressed Lungs:  Vent supported breaths, bilateral, coarse breath sounds, junky sounding, Coughs Abdomen:  BS+, soft, nontender Musculoskeletal:  Diminished tone on my exam Skin:  R IJ perm cath> clean, PEG > clean  LABS: PULMONARY  Recent Labs Lab 11/17/14 0344  PHART 7.361  PCO2ART 41.2  PO2ART 84.2  HCO3 22.7  TCO2 24.0  O2SAT 94.8   CBC  Recent Labs Lab 11/16/14 0343 11/17/14 0249 11/18/14 0217  HGB  10.8* 10.5* 11.0*  HCT 33.5* 33.0* 34.4*  WBC 6.6 7.2 7.5  PLT 179 242 262   COAGULATION No results for input(s): INR in the last 168 hours. CARDIAC  No results for input(s): TROPONINI in the last 168 hours. No results for input(s): PROBNP in the last 168 hours.  CHEMISTRY  Recent Labs Lab 11/13/14 0221  11/13/14 0529 11/14/14 0820 11/16/14 0343 11/17/14 0249 11/18/14 0217  NA  --   --  135 134* 140 137 140  K  --   < > 3.8 5.2* 4.4 4.3 3.5  CL  --   --  98* 101 103 100* 101  CO2  --   --  24 23 21* 21* 28  GLUCOSE  --   --  86 101* 79 93 99  BUN  --   --  41* 68* 95* 119* 62*  CREATININE  --   --  6.42* 7.42* 7.63* 8.37* 5.19*  CALCIUM  --    --  8.7* 8.6* 8.8* 8.8* 8.6*  MG 1.9  --   --   --   --  2.1 1.9  PHOS  --   --  5.3* 5.9*  --  6.3* 3.9  < > = values in this interval not displayed. Estimated Creatinine Clearance: 13.5 mL/min (by C-G formula based on Cr of 5.19).  LIVER  Recent Labs Lab 11/14/14 0820 11/16/14 0343  AST  --  22  ALT  --  11*  ALKPHOS  --  67  BILITOT  --  0.8  PROT  --  6.4*  ALBUMIN 2.1* 2.1*   INFECTIOUS No results for input(s): LATICACIDVEN, PROCALCITON in the last 168 hours.  ENDOCRINE CBG (last 3)   Recent Labs  11/18/14 1940 11/18/14 2350 11/19/14 0338  GLUCAP 91 97 94   IMAGING x48h   No results found.   ASSESSMENT / PLAN:  PULMONARY OETT 8/9 >  A:Intubated for airway protection - Acute Resp failure due to stroke \ Weaning on PS trials but airway protection is the primary concern here.  P:   SBT to extubate one way when family is ready Shirley Friar with daughter no trach and explained one way extubation Titrate O2 for sat of 88-92%.  CARDIOVASCULAR  A: Hypertension Pulmonary hypertension by echo> cause uncertain, has not had RHC PFO History of aortic aneurysm  P:  BP management post TPA  Tele monitoring until off vent.  RENAL A:  ESRD RN reports LUE Fistula and RT subclavian tunneled cath  P:   Replace electrolytes as indicated. Renal consult appreciated. HD today   GASTROINTESTINAL A:  Recent ischemic bowel, perforation, treated with surgery in [place] 2016 No acute issues now  P:   TF at goal  PPI.  HEMATOLOGIC A:  S/p tPA 11/10/2014  P:  Monitor for bleeding. D/C further blood draws.  INFECTIOUS A:  No acute issues  P:   Monitor for fever.  ENDOCRINE A:  Hypoglycemia - resolved with TF.  P:   D/C CBGs once extubated.  NEUROLOGIC A:  Acute L MCA stroke Acute encephalopathy post stroke> med related from intubation? ICH?  P:   D/Ced all sedation.  FAMILY  - Updates: 8/17 Daughter - no reintubation but they wish for resuscitation.   she is discussing it with her aunt and will let us know   The patient is critically ill with multiple organ systems failure and requires high complexity decision making for assessment and support, frequent evaluation and titration  of therapies, application of advanced monitoring technologies and extensive interpretation of multiple databases.   Critical Care Time devoted to patient care services described in this note is  31  Minutes.   Cyril Mourning MD. Tonny Bollman. [facility] Pager [phone] If no response call [phone]    11/19/2014 7:58 AM

## 2014-11-19 NOTE — Progress Notes (Signed)
STROKE TEAM PROGRESS NOTE   HISTORY [name] is an 59 y.o. female who was at dialysis and finished her session. After dialysis while still at the center she was noted to have sudden onset right sided weakness, left gaze deviation (LKW 11/10/2014 at 1106). EMS was called and on site BG was noted to be 50. Patient was given D50 and was transported to [facility] ED. On arrival she had labored respirations was not protecting her airway, and required intubation. CT of head was delayed due to need for intubation. CT head obtained and showed no acte stroke or bleed. Patient was deemed to be a candidate for TPA and was brought back to the ED. Dialysis needles were still present in the patient's AV fistula. IV team was consulted and removed the hemodialysis needles as well as achieved hemostasis. IV tPA was then administered. CT angiogram of the head and neck was obtained which showed no large vessel proximal occlusion. She was subsequently admitted to the neuro intensive care unit for further management.    SUBJECTIVE (INTERVAL HISTORY) She is still intutbated. Pt has been made DNI but not DNR yet. Discussed with daughter at bedside and she agrees with DNI but not full DNR. With knowing DNI but not DNR that CPR may not successful, she stated "you never know". I mentioned discomfort with likely unsuccessful CPR, she stated that "my mom is a Product manager".    OBJECTIVE Temp:  [97.8 F (36.6 C)-99.1 F (37.3 C)] 98.2 F (36.8 C) (08/18 0705) Pulse Rate:  [96-112] 99 (08/18 0900) Cardiac Rhythm:  [-] Normal sinus rhythm (08/18 0800) Resp:  [15-31] 21 (08/18 0900) BP: (106-170)/(61-127) 108/66 mmHg (08/18 0900) SpO2:  [95 %-100 %] 98 % (08/18 0900) FiO2 (%):  [40 %] 40 % (08/18 0900) Weight:  [194 lb 7.1 oz (88.2 kg)-196 lb 6.9 oz (89.1 kg)] 194 lb 7.1 oz (88.2 kg) (08/18 0705)   Recent Labs Lab 11/18/14 1651 11/18/14 1940 11/18/14 2350 11/19/14 0338 11/19/14 0832  GLUCAP 94 91 97 94 92     Recent Labs Lab 11/13/14 0221  11/13/14 0529 11/14/14 0820 11/16/14 0343 11/17/14 0249 11/18/14 0217 11/19/14 0841  NA  --   < > 135 134* 140 137 140 141  K  --   < > 3.8 5.2* 4.4 4.3 3.5 5.0  CL  --   < > 98* 101 103 100* 101 102  CO2  --   < > 24 23 21* 21* 28 27  GLUCOSE  --   < > 86 101* 79 93 99 93  BUN  --   < > 41* 68* 95* 119* 62* 99*  CREATININE  --   < > 6.42* 7.42* 7.63* 8.37* 5.19* 6.89*  CALCIUM  --   < > 8.7* 8.6* 8.8* 8.8* 8.6* 8.8*  MG 1.9  --   --   --   --  2.1 1.9  --   PHOS  --   --  5.3* 5.9*  --  6.3* 3.9 5.8*  < > = values in this interval not displayed.  Recent Labs Lab 11/14/14 0820 11/16/14 0343 11/19/14 0841  AST  --  22  --   ALT  --  11*  --   ALKPHOS  --  67  --   BILITOT  --  0.8  --   PROT  --  6.4*  --   ALBUMIN 2.1* 2.1* 2.0*    Recent Labs Lab 11/13/14 0323 11/16/14  1610 11/17/14 0249 11/18/14 0217  WBC 7.4 6.6 7.2 7.5  HGB 10.7* 10.8* 10.5* 11.0*  HCT 32.6* 33.5* 33.0* 34.4*  MCV 88.3 87.0 88.9 89.8  PLT 172 179 242 262   No results for input(s): CKTOTAL, CKMB, CKMBINDEX, TROPONINI in the last 168 hours. No results for input(s): LABPROT, INR in the last 72 hours. No results for input(s): COLORURINE, LABSPEC, PHURINE, GLUCOSEU, HGBUR, BILIRUBINUR, KETONESUR, PROTEINUR, UROBILINOGEN, NITRITE, LEUKOCYTESUR in the last 72 hours.  Invalid input(s): APPERANCEUR     Component Value Date/Time   CHOL 135 11/11/2014 0330   TRIG 95 11/11/2014 0330   HDL 37* 11/11/2014 0330   CHOLHDL 3.6 11/11/2014 0330   VLDL 19 11/11/2014 0330   LDLCALC 79 11/11/2014 0330   Lab Results  Component Value Date   HGBA1C 4.8 11/11/2014      Component Value Date/Time   LABOPIA NONE DETECTED 11/10/2014 1336   COCAINSCRNUR NONE DETECTED 11/10/2014 1336   LABBENZ POSITIVE* 11/10/2014 1336   AMPHETMU NONE DETECTED 11/10/2014 1336   THCU POSITIVE* 11/10/2014 1336   LABBARB NONE DETECTED 11/10/2014 1336    No results for input(s): ETH in the  last 168 hours.   IMAGING I have personally reviewed the radiological images below and agree with the radiology interpretations.  MRI Brain without contrast 11/12/2014 1. Moderate size left MCA territory infarct affecting the left insula and operculum. No associated hemorrhage or mass effect. 2. Expected evolution of the posterior right ACA and posterior left MCA infarcts seen in March.  Ct Head Wo Contrast 11/10/2014    No evidence for post t-PA hemorrhage. Early cytotoxic edema now noted in the LEFT hemisphere.    11/10/2014   1. No acute intracranial pathology.     Ct Angio Head & Neck W/cm &/or Wo/cm 11/10/2014    1. 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  2. No proximal large vessel occlusion.  3. Moderate irregular narrowing and attenuation of ACA branch vessels, progressed from prior MRA.  4. Mild-to-moderate plaque in the cervical carotid arteries without stenosis.  5. Mild proximal left vertebral artery stenosis.    Portable Chest Xray 11/11/2014    1. Lines and tubes in stable position. 2. New onset of dense left lower lobe atelectasis and consolidation. Small left pleural effusion.    11/10/2014   Endotracheal tube terminates 3 cm above the carina.     EEG : mild global cerebral dysfunction as seen in a wide variety of toxic metabolic encephalopathies and degenerative brain disorders. No electrographic seizures noted.  2D-Echo 11/12/14 - Left ventricle: The cavity size was moderately reduced. Wall thickness was increased in a pattern of severe LVH. Systolic function was normal. The estimated ejection fraction was in the range of 60% to 65%. Wall motion was normal; there were no regional wall motion abnormalities. - Right ventricle: The cavity size was mildly dilated. Systolic function was mildly reduced. - Right atrium: The atrium was severely dilated. - Tricuspid valve: There was severe regurgitation. - Pulmonary arteries: Systolic pressure was  severely increased. PA peak pressure: 86 mm Hg (S). - Pericardium, extracardiac: A small pericardial effusion was identified circumferential to the heart. There was no evidence of hemodynamic compromise.   PHYSICAL EXAM  Temp:  [97.8 F (36.6 C)-99.1 F (37.3 C)] 98.2 F (36.8 C) (08/18 0705) Pulse Rate:  [96-112] 99 (08/18 0900) Resp:  [15-31] 21 (08/18 0900) BP: (106-170)/(61-127) 108/66 mmHg (08/18 0900) SpO2:  [95 %-100 %] 98 % (08/18 0900) FiO2 (%):  [  40 %] 40 % (08/18 0900) Weight:  [194 lb 7.1 oz (88.2 kg)-196 lb 6.9 oz (89.1 kg)] 194 lb 7.1 oz (88.2 kg) (08/18 0705)  General - obese, well developed, intubated on vent.  Ophthalmologic - Fundi not visualized due to noncooperation.  Cardiovascular - Regular rate and rhythm.  Neuro - intubated on vent, more awake and open eyes spontaneously, but still not following commands. PERRL, able to track objects intermittently, but still left gaze preference but able to cross midline intermittently, doll's eye present, corneal, gag and cough present. RUE 0/5 proximal but 3/5 distally, LUE localize to pain and against gravity, LLE 2/5 on pain, and RLE 2/5 on pain, b/l unsustained ankle clonus. Increased muscle tone RUE and BLE. Babinski mute bilaterally. Sensory, coordination and gait not tested.  ASSESSMENT: Ms. Hinata AMONIE WISSER is a 59 y.o. female with history of HTN, ESRD on HD, SVT, PFO, previous stroke, COPD, AAA, and recent bowel ischemia leading to resection admitted for right sided weakness, left gaze deviation. She received IV t-PA 11/10/2014 at 1340.   Stroke:  Dominant left MCA infarct embolic secondary to known atrial fibrillation   Resultant  VDRF, right hemiparesis  MRI  Moderate size left MCA territory infarct.  CTA head & neck L M2 branch occlusion.   2D Echo EF 60-65%   TEE 06/23/2014 PFO. Severe RV dysfunction, RAE, TR. Incessant atrial tachycardia  LE venous dopplers negative for DVT  LDL 79  HgbA1c  4.8  SCDs for VTE prophylaxis (listed heparin antibody positive, not able to do arixtra due to ESRD) Diet NPO time specified  aspirin 81 mg orally every day prior to admission, now on 300 mg rectal. Family requested one way extubation but asking for DNI  Ongoing aggressive stroke risk factor management  Continued need for ICU level care  Therapy recommendations:  pending   Disposition:  pending   Paroxysmal Atrial Fibrillation  Documented on tele 07/27/2014  CHADS-VASc is 4 (stroke, HTN, Female).   Decided not an anticoagulation candidate at that time as risk outweighs benefits given multiple medical issues (see note 07/28/2014 from Dr. Anne Fu -"Ultimately deserves anticoagulation (coumadin). Given her multitude of co morbidities (including fistula) she would not be an optimal candidate for anticoagulation. Risks outweigh benefits. Family understands risk of future stroke/ thrombus.")  May reconsider anticoagulation if family aggressive and pt medical condition improves         Condition Points   C CHF (or Left ventricular systolic dysfunction) 0   H HTN: BP consistently above 140/90 mmHg (or treated HTN on meds)  1   A2 Age ?75 years 0   D DM 0   S2 Prior Stroke or TIA or thromboembolism 2   V Vascular disease (e.g. PAD, MI, aortic plaque) 0   A Age 77-74 years 0   Taylor female sex  1           Annual Stroke Risk CHA2DS2-VASc Score Stroke Risk % 95% CI  0 0  1 1.3  2 2.2  3 3.2  4 4.0  5 6.7  6 9.8  7 9.6  8 12.5  9 15.2     Condition Points   H HTN: uncontrolled, >160 mmHg systolic 1   A2 Abnormal renal function: Dialysis, transplant, Cr >2.6 mg/dL or >865 mol/L Abnormal liver function: Cirrhosis or Bilirubin >2x Normal or AST/ALT/AP >3x Normal 1 0   S Prior history of stroke  1   B Bleeding: prior major bleeding or predisposition  to bleeding 0  L Labile INR: unstable / high INRs, time in therapeutic range < 60% 0  E Elderly: age > 65 years Medication Usage  Predisposing to Bleeding: (Antiplatelet agents, NSAIDs) 0 0  D  Prior alcohol or drug usage history 0    HAS-BLED score n Bleeds, n Bleeds/100 patients  0 798 9 1.13  1 1286 13 1.02  2 744 14 1.88  3 187 7 3.74  4 46 4 8.70  5 8 1  12.50    Previous stroke  Prolonged hospitalization from 06/17/14 to 09/11/14  Right hemisphere acute infarcts involving right ACA, left PCA, and left MCA, cardioembolic pattern, pt w/ PAF  Home meds include ASA 81mg    Respiratory Failure  Intubated  Likely one way extubation tomorrow  Daughter agrees with DNI but not full DNR  CCM on board   ? Possible seizure  Intermittent, arrhythmic twitching of R hand  Started on Keppra, 1 gm load followed by 500 mg bid  EEG negative for seizure  AcceleratedHypertension   BP 189/118 on arrival  stable  Not on BP meds   Hyperlipidemia  Home meds:  No statin  LDL 79, goal < 70  Add lipitor 10mg  daily  Diabetes  Hypoglycemia over night w/ dextrose given  HgbA1c 4.8, at goal < 7.0  Controlled  Other Stroke Risk Factors  Cigarette smoker  UDS positive for benzos and THC  Obesity, Body mass index is 30.45 kg/(m^2).   Known PFO  Other Active Problems  ESRD on HD T, TH, S  AAA 3 cm by CT 08/2014  Other Pertinent History  Recent admission for bowel ischemia and resection [place] 2016; palliative care heavily involved.  DNI now  Hospital day # 9  This patient is critically ill due to left MCA infarct, with left M2 occlusion, respiratory failure, Afib, ESRD and at significant risk of neurological worsening, death form recurrent infarcts, respiratory failure, heart failure, fluid overload. This patient's care requires constant monitoring of vital signs, hemodynamics, respiratory and cardiac monitoring, review of multiple databases, neurological assessment, discussion with family, other specialists and medical decision making of high complexity. I had long disussion with pt daughter  about code status and prognosis, she insisted DNI but not full DNR. I spent 40 minutes of neurocritical care time in the care of this patient.  Marvel Plan, MD PhD Stroke Neurology 11/19/2014 9:34 AM  To contact Stroke Continuity provider, please refer to [email or web address]. After hours, contact General Neurology

## 2014-11-20 DIAGNOSIS — Z66 Do not resuscitate: Secondary | ICD-10-CM | POA: Diagnosis present

## 2014-11-20 LAB — GLUCOSE, CAPILLARY
GLUCOSE-CAPILLARY: 106 mg/dL — AB (ref 65–99)
GLUCOSE-CAPILLARY: 99 mg/dL (ref 65–99)
GLUCOSE-CAPILLARY: 97 mg/dL (ref 65–99)
GLUCOSE-CAPILLARY: 99 mg/dL (ref 65–99)
GLUCOSE-CAPILLARY: 79 mg/dL (ref 65–99)
GLUCOSE-CAPILLARY: 81 mg/dL (ref 65–99)
Glucose-Capillary: 64 mg/dL — ABNORMAL LOW (ref 65–99)

## 2014-11-20 LAB — BASIC METABOLIC PANEL
ANION GAP: 12 (ref 5–15)
BUN: 70 mg/dL — AB (ref 6–20)
CO2: 27 mmol/L (ref 22–32)
Calcium: 8.8 mg/dL — ABNORMAL LOW (ref 8.9–10.3)
Chloride: 102 mmol/L (ref 101–111)
Creatinine, Ser: 5.79 mg/dL — ABNORMAL HIGH (ref 0.44–1.00)
GFR, EST AFRICAN AMERICAN: 8 mL/min — AB (ref >60)
GFR, EST NON AFRICAN AMERICAN: 7 mL/min — AB (ref >60)
Glucose, Bld: 97 mg/dL (ref 65–99)
POTASSIUM: 3.4 mmol/L — AB (ref 3.5–5.1)
SODIUM: 141 mmol/L (ref 135–145)

## 2014-11-20 LAB — CBC
HEMATOCRIT: 30.5 % — AB (ref 36.0–46.0)
HEMOGLOBIN: 9.5 g/dL — AB (ref 12.0–15.0)
MCH: 27.6 pg (ref 26.0–34.0)
MCHC: 31.1 g/dL (ref 30.0–36.0)
MCV: 88.7 fL (ref 78.0–100.0)
Platelets: 269 10*3/uL (ref 150–400)
RBC: 3.44 MIL/uL — AB (ref 3.87–5.11)
RDW: 18.7 % — AB (ref 11.5–15.5)
WBC: 6.3 10*3/uL (ref 4.0–10.5)

## 2014-11-20 MED ORDER — DARBEPOETIN ALFA 100 MCG/0.5ML IJ SOSY
100.0000 ug | PREFILLED_SYRINGE | INTRAMUSCULAR | Status: DC
  Administered 2014-11-24: 100 ug via INTRAVENOUS
  Filled 2014-11-20: qty 0.5

## 2014-11-20 MED ORDER — GLYCOPYRROLATE 0.2 MG/ML IJ SOLN: 0.2000 mg | Freq: Four times a day (QID) | INTRAMUSCULAR | Status: DC | PRN

## 2014-11-20 MED ORDER — MORPHINE SULFATE (PF) 2 MG/ML IV SOLN
1.0000 mg | INTRAVENOUS | Status: DC | PRN
Start: 2014-11-20 — End: 2014-11-22
  Administered 2014-11-20 – 2014-11-21 (×5): 2 mg via INTRAVENOUS
  Filled 2014-11-20 (×5): qty 1

## 2014-11-20 MED ORDER — ATROPINE SULFATE 1 % OP SOLN
4.0000 [drp] | OPHTHALMIC | Status: DC | PRN
  Administered 2014-11-20 (×2): 4 [drp] via SUBLINGUAL
  Filled 2014-11-20: qty 2

## 2014-11-20 NOTE — Progress Notes (Signed)
Long discussion w/ daughter and primary caregiver [name] w/ her extended family and church support all involved. We discussed current exam, diagnosis, expectations and possible issues that could arise s/p extubation. Specifically we discussed respiratory failure and CPR. Previously family had requested full CPR but did not want re-intubation. We discussed that given her CVA the most likely scenario would be a respiratory arrest and that CPR w/out treating respiratory failure would be medically ineffective care. They all are hopeful that she can recover, but they do not want her to suffer. At the end of our discussion the family all agreed to continue current care with hopes for further recovery. They did agree that CPR and ACLS would NOT be in Ms Shillingburg's interest. Based on our discussion the plan is as follows:  1) extubate 2) continue full medical care, this includes: IV hydration, antibiotics (if/when needed), nutritional support, rehab services.  3) full DNR. This includes no CPR or intubation.   Simonne Martinet ACNP-BC [facility] Pulmonary/Critical Care Pager # [phone] OR # [phone] if no answer

## 2014-11-20 NOTE — Progress Notes (Signed)
Subjective: Interval History: has no complaint, does not obey commands.  Objective: Vital signs in last 24 hours: Temp:  [98.6 F (37 C)-99.4 F (37.4 C)] 98.7 F (37.1 C) (08/19 0815) Pulse Rate:  [98-113] 104 (08/19 0800) Resp:  [17-30] 29 (08/19 0800) BP: (95-142)/(61-85) 115/77 mmHg (08/19 0800) SpO2:  [95 %-100 %] 98 % (08/19 0800) FiO2 (%):  [40 %] 40 % (08/19 0800) Weight:  [87.9 kg (193 lb 12.6 oz)] 87.9 kg (193 lb 12.6 oz) (08/19 0500) Weight change: -0.9 kg (-1 lb 15.8 oz)  Intake/Output from previous day: 08/18 0701 - 08/19 0700 In: 1120 [I.V.:240; NG/GT:720] Out: 1901 [Stool:1] Intake/Output this shift:    General appearance: alert and R gaze pref , not coop Resp: diminished breath sounds bilaterally Chest wall: RIJ cath Cardio: irregularly irregular rhythm and systolic murmur: holosystolic 2/6, blowing at apex GI: obese, scars, PEG Extremities: edema 2+, AVF LUA  Lab Results:  Recent Labs  11/18/14 0217 11/20/14 0243  WBC 7.5 6.3  HGB 11.0* 9.5*  HCT 34.4* 30.5*  PLT 262 269   BMET:  Recent Labs  11/19/14 0841 11/20/14 0243  NA 141 141  K 5.0 3.4*  CL 102 102  CO2 27 27  GLUCOSE 93 97  BUN 99* 70*  CREATININE 6.89* 5.79*  CALCIUM 8.8* 8.8*   No results for input(s): PTH in the last 72 hours. Iron Studies: No results for input(s): IRON, TIBC, TRANSFERRIN, FERRITIN in the last 72 hours.  Studies/Results: No results found.  I have reviewed the patient's current medications.  Assessment/Plan: 1 ESRD  HD TTS,  ? Use hep 2 Anemia add esa 3 CVA little recovery 4 Entub DNI, airway protection 5 Nutrit  TF 6 Afib 7 HPTH P TF, HD, eval avf    LOS: 10 days   Adeleine Pask L 11/20/2014,9:14 AM

## 2014-11-20 NOTE — Progress Notes (Signed)
Dr Marchelle Gearing stated to wait to resume tube feedings until tomorrow morning due to decreased resp status. PEG can be used for meds only. Will continue to monitor.

## 2014-11-20 NOTE — Procedures (Signed)
Extubation Procedure Note  Patient Details:   Name: [name] DOB: 06/10/1955 MRN: [id number]   Airway Documentation:     Evaluation  O2 sats: stable throughout Complications: No apparent complications Patient did tolerate procedure well. Bilateral Breath Sounds: Diminished Suctioning: Airway No, unable to speak due to neurological status. SPO2 97% on 4L [place]  Toula Moos 11/20/2014, 12:00 PM

## 2014-11-20 NOTE — Progress Notes (Signed)
PULMONARY / CRITICAL CARE MEDICINE   Name: [name] MRN: [id number] DOB: 15-Sep-1955    ADMISSION DATE:  11/10/2014 CONSULTATION DATE:  11/10/2014  REFERRING MD :  Pearlean Brownie  CHIEF COMPLAINT:  Stroke, post TPA  INITIAL PRESENTATION:  This is a 59 y/o female who had an acute left MCA stroke 8/9 and received IV TPA.  She was intubated for airway protection in the ER.  She has a long history of ESRD, a recent R ACA stroke, and a recent history of ischemic bowel requiring resection.  On 8/9  at dialysis she developed sudden onset of R sided weakness.  She was brought to the ER for further evaluation.  No further history could be obtained due to intubation.  Of note, after TPA she had bleeding from her AV fistula   STUDIES:  8/9 CT head > no acute abnormality 8/9 CT angiogram post tpa> L MCA m2 superior division branch vessel occlusion, moderate irregular narrowing and attenuation of ACA branch vessels, progressed from prior MRA 8/9 admission, IV TPA for L MCA stroke  11/11/14: CT head pending. Neuro Dr Pearlean Brownie discussing goals with dtr. Dtr denied any questions to me. On prn sedation - receieved none - followed some command with delayed response but somnolent  11/13/2014: 11/12/14 Moderate MCAon MRI yesterday.  Still on vent. Family not at bedside. No change noted other than agitation overnight with same during SBT today. Lot of resp sexcretions. RN says dtr - very reluctant for DNR  11/14/14: Dr Pearlean Brownie goals of care - full code (family not buying gloom and doom message based on prior experience). Remains aphasic with right hemiparesis. LE duplex negative for DVT. On HD.    SUBJECTIVE/OVERNIGHT/INTERVAL HX HD being started, opens eyes but not following commands.  VITAL SIGNS: Temp:  [98.6 F (37 C)-99.4 F (37.4 C)] 98.7 F (37.1 C) (08/19 0815) Pulse Rate:  [98-113] 103 (08/19 1000) Resp:  [17-30] 27 (08/19 1000) BP: (95-142)/(61-85) 110/70 mmHg (08/19 1000) SpO2:  [96 %-100 %] 99 % (08/19  1000) FiO2 (%):  [40 %] 40 % (08/19 1000) Weight:  [87.9 kg (193 lb 12.6 oz)] 87.9 kg (193 lb 12.6 oz) (08/19 0500)   HEMODYNAMICS:   VENTILATOR SETTINGS: Vent Mode:  [-] CPAP;PSV FiO2 (%):  [40 %] 40 % Set Rate:  [14 bmp] 14 bmp Vt Set:  [500 mL] 500 mL PEEP:  [5 cmH20] 5 cmH20 Pressure Support:  [5 cmH20-12 cmH20] 5 cmH20 Plateau Pressure:  [16 cmH20-20 cmH20] 16 cmH20   INTAKE / OUTPUT:  Intake/Output Summary (Last 24 hours) at 11/20/14 1101 Last data filed at 11/20/14 1000  Gross per 24 hour  Intake   1080 ml  Output   1901 ml  Net   -821 ml   PHYSICAL EXAMINATION: General:  Chronically ill appearing, on vent Neuro:  Has weak cough to tracheal suctioning, opens eyes, right side weak. Aphasic per neuro. HEENT:  NCAT, ETT in place Cardiovascular:  RRR, systolic murmur noted, left upper arm AV fistula > dressed Lungs:  Vent supported breaths, bilateral, coarse breath sounds, junky sounding, Coughs Abdomen:  BS+, soft, nontender Musculoskeletal:  Diminished tone on my exam Skin:  R IJ perm cath> clean, PEG > clean  LABS: PULMONARY  Recent Labs Lab 11/17/14 0344  PHART 7.361  PCO2ART 41.2  PO2ART 84.2  HCO3 22.7  TCO2 24.0  O2SAT 94.8   CBC  Recent Labs Lab 11/17/14 0249 11/18/14 0217 11/20/14 0243  HGB 10.5* 11.0* 9.5*  HCT  33.0* 34.4* 30.5*  WBC 7.2 7.5 6.3  PLT 242 262 269   COAGULATION No results for input(s): INR in the last 168 hours. CARDIAC  No results for input(s): TROPONINI in the last 168 hours. No results for input(s): PROBNP in the last 168 hours.  CHEMISTRY  Recent Labs Lab 11/14/14 0820 11/16/14 0343 11/17/14 0249 11/18/14 0217 11/19/14 0841 11/20/14 0243  NA 134* 140 137 140 141 141  K 5.2* 4.4 4.3 3.5 5.0 3.4*  CL 101 103 100* 101 102 102  CO2 23 21* 21* 28 27 27   GLUCOSE 101* 79 93 99 93 97  BUN 68* 95* 119* 62* 99* 70*  CREATININE 7.42* 7.63* 8.37* 5.19* 6.89* 5.79*  CALCIUM 8.6* 8.8* 8.8* 8.6* 8.8* 8.8*  MG  --   --   2.1 1.9  --   --   PHOS 5.9*  --  6.3* 3.9 5.8*  --    Estimated Creatinine Clearance: 12.1 mL/min (by C-G formula based on Cr of 5.79).  LIVER  Recent Labs Lab 11/14/14 0820 11/16/14 0343 11/19/14 0841  AST  --  22  --   ALT  --  11*  --   ALKPHOS  --  67  --   BILITOT  --  0.8  --   PROT  --  6.4*  --   ALBUMIN 2.1* 2.1* 2.0*   INFECTIOUS No results for input(s): LATICACIDVEN, PROCALCITON in the last 168 hours.  ENDOCRINE CBG (last 3)   Recent Labs  11/19/14 2328 11/20/14 0417 11/20/14 0815  GLUCAP 106* 99 97   IMAGING x48h   No results found.   ASSESSMENT / PLAN:  PULMONARY OETT 8/9 >  A:  Intubated for airway protection - Acute Resp failure due to stroke  Weaning on PS trials but airway protection is the primary concern here.  P:   SBT to extubate one way when family is ready Titrate O2 for sat of 88-92%.  CARDIOVASCULAR  A:  Hypertension Pulmonary hypertension by echo> cause uncertain, has not had RHC PFO History of aortic aneurysm  P:  Tele monitoring until off vent.  RENAL A:   ESRD RN reports LUE Fistula and RT subclavian tunneled cath  P:   Replace electrolytes as indicated. Renal consult appreciated. HD today   GASTROINTESTINAL A:   Recent ischemic bowel, perforation, treated with surgery in [place] 2016 No acute issues now  P:   TF at goal  PPI.  HEMATOLOGIC A:  S/p tPA 11/10/2014  P:  Monitor for bleeding. D/C further blood draws.  INFECTIOUS A:  No acute issues  P:   Monitor for fever.  ENDOCRINE A:  Hypoglycemia - resolved with TF.  P:   D/C CBGs once extubated.  NEUROLOGIC A:   Acute L MCA stroke Acute encephalopathy post stroke> med related from intubation? ICH?  P:   D/Ced all sedation.  FAMILY  - Updates: 8/17 Daughter - no reintubation but they wish for resuscitation.  she is discussing it with her aunt and will let us know  Pending repeat goals discussion 8/19   Simonne Martinet  ACNP-BC [facility] Pulmonary/Critical Care Pager # [phone] OR # [phone] if no answer   11/20/2014 11:01 AM

## 2014-11-20 NOTE — Progress Notes (Signed)
Hypoglycemic Event  CBG: 64   Treatment: 15 GM carbohydrate snack at 2350   Symptoms: None  Follow-up CBG: Time: 0010 CBG Result:71  Possible Reasons for Event: Inadequate meal intake (pt NPO)  Comments/MD notified: will continue to monitor closely     Chapman Fitch  Remember to initiate Hypoglycemia Order Set & complete

## 2014-11-20 NOTE — Progress Notes (Signed)
eLink Physician-Brief Progress Note Patient Name: Artha C Sibert DOB: Dec 07, 1955 MRN: [id number]   Date of Service  11/20/2014  HPI/Events of Note  Post extubation RN concenrned about ability to swallow  eICU Interventions  NPO  meds via PEG No TF till reassessment 11/21/14      Intervention Category Evaluation Type: Other  Pleshette Tomasini 11/20/2014, 6:07 PM

## 2014-11-20 NOTE — Progress Notes (Signed)
STROKE TEAM PROGRESS NOTE   HISTORY [name] is an 59 y.o. female who was at dialysis and finished her session. After dialysis while still at the center she was noted to have sudden onset right sided weakness, left gaze deviation (LKW 11/10/2014 at 1106). EMS was called and on site BG was noted to be 50. Patient was given D50 and was transported to [facility] ED. On arrival she had labored respirations was not protecting her airway, and required intubation. CT of head was delayed due to need for intubation. CT head obtained and showed no acte stroke or bleed. Patient was deemed to be a candidate for TPA and was brought back to the ED. Dialysis needles were still present in the patient's AV fistula. IV team was consulted and removed the hemodialysis needles as well as achieved hemostasis. IV tPA was then administered. CT angiogram of the head and neck was obtained which showed no large vessel proximal occlusion. She was subsequently admitted to the neuro intensive care unit for further management.    SUBJECTIVE (INTERVAL HISTORY) She is still intutbated. Family is at bedside. Plan to extubate soon. Pt more awake alert but still not following commands. Had HD yesterday taking off 1.9 L fluids. Still has copious secretions from ET tube.   OBJECTIVE Temp:  [98.6 F (37 C)-99.4 F (37.4 C)] 98.7 F (37.1 C) (08/19 0815) Pulse Rate:  [98-113] 104 (08/19 0800) Cardiac Rhythm:  [-] Sinus tachycardia (08/19 0800) Resp:  [17-30] 29 (08/19 0800) BP: (95-142)/(61-85) 115/77 mmHg (08/19 0800) SpO2:  [95 %-100 %] 98 % (08/19 0800) FiO2 (%):  [40 %] 40 % (08/19 0800) Weight:  [193 lb 12.6 oz (87.9 kg)] 193 lb 12.6 oz (87.9 kg) (08/19 0500)   Recent Labs Lab 11/19/14 1544 11/19/14 1943 11/19/14 2328 11/20/14 0417 11/20/14 0815  GLUCAP 97 102* 106* 99 97    Recent Labs Lab 11/14/14 0820 11/16/14 0343 11/17/14 0249 11/18/14 0217 11/19/14 0841 11/20/14 0243  NA 134* 140 137 140 141 141  K 5.2*  4.4 4.3 3.5 5.0 3.4*  CL 101 103 100* 101 102 102  CO2 23 21* 21* 28 27 27   GLUCOSE 101* 79 93 99 93 97  BUN 68* 95* 119* 62* 99* 70*  CREATININE 7.42* 7.63* 8.37* 5.19* 6.89* 5.79*  CALCIUM 8.6* 8.8* 8.8* 8.6* 8.8* 8.8*  MG  --   --  2.1 1.9  --   --   PHOS 5.9*  --  6.3* 3.9 5.8*  --     Recent Labs Lab 11/14/14 0820 11/16/14 0343 11/19/14 0841  AST  --  22  --   ALT  --  11*  --   ALKPHOS  --  67  --   BILITOT  --  0.8  --   PROT  --  6.4*  --   ALBUMIN 2.1* 2.1* 2.0*    Recent Labs Lab 11/16/14 0343 11/17/14 0249 11/18/14 0217 11/20/14 0243  WBC 6.6 7.2 7.5 6.3  HGB 10.8* 10.5* 11.0* 9.5*  HCT 33.5* 33.0* 34.4* 30.5*  MCV 87.0 88.9 89.8 88.7  PLT 179 242 262 269   No results for input(s): CKTOTAL, CKMB, CKMBINDEX, TROPONINI in the last 168 hours. No results for input(s): LABPROT, INR in the last 72 hours. No results for input(s): COLORURINE, LABSPEC, PHURINE, GLUCOSEU, HGBUR, BILIRUBINUR, KETONESUR, PROTEINUR, UROBILINOGEN, NITRITE, LEUKOCYTESUR in the last 72 hours.  Invalid input(s): APPERANCEUR     Component Value Date/Time   CHOL 135 11/11/2014 0330  TRIG 95 11/11/2014 0330   HDL 37* 11/11/2014 0330   CHOLHDL 3.6 11/11/2014 0330   VLDL 19 11/11/2014 0330   LDLCALC 79 11/11/2014 0330   Lab Results  Component Value Date   HGBA1C 4.8 11/11/2014      Component Value Date/Time   LABOPIA NONE DETECTED 11/10/2014 1336   COCAINSCRNUR NONE DETECTED 11/10/2014 1336   LABBENZ POSITIVE* 11/10/2014 1336   AMPHETMU NONE DETECTED 11/10/2014 1336   THCU POSITIVE* 11/10/2014 1336   LABBARB NONE DETECTED 11/10/2014 1336    No results for input(s): ETH in the last 168 hours.   IMAGING I have personally reviewed the radiological images below and agree with the radiology interpretations.  MRI Brain without contrast 11/12/2014 1. Moderate size left MCA territory infarct affecting the left insula and operculum. No associated hemorrhage or mass effect. 2.  Expected evolution of the posterior right ACA and posterior left MCA infarcts seen in March.  Ct Head Wo Contrast 11/10/2014    No evidence for post t-PA hemorrhage. Early cytotoxic edema now noted in the LEFT hemisphere.    11/10/2014   1. No acute intracranial pathology.     Ct Angio Head & Neck W/cm &/or Wo/cm 11/10/2014    1. 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  2. No proximal large vessel occlusion.  3. Moderate irregular narrowing and attenuation of ACA branch vessels, progressed from prior MRA.  4. Mild-to-moderate plaque in the cervical carotid arteries without stenosis.  5. Mild proximal left vertebral artery stenosis.    Portable Chest Xray 11/11/2014    1. Lines and tubes in stable position. 2. New onset of dense left lower lobe atelectasis and consolidation. Small left pleural effusion.    11/10/2014   Endotracheal tube terminates 3 cm above the carina.     EEG : mild global cerebral dysfunction as seen in a wide variety of toxic metabolic encephalopathies and degenerative brain disorders. No electrographic seizures noted.  2D-Echo 11/12/14 - Left ventricle: The cavity size was moderately reduced. Wall thickness was increased in a pattern of severe LVH. Systolic function was normal. The estimated ejection fraction was in the range of 60% to 65%. Wall motion was normal; there were no regional wall motion abnormalities. - Right ventricle: The cavity size was mildly dilated. Systolic function was mildly reduced. - Right atrium: The atrium was severely dilated. - Tricuspid valve: There was severe regurgitation. - Pulmonary arteries: Systolic pressure was severely increased. PA peak pressure: 86 mm Hg (S). - Pericardium, extracardiac: A small pericardial effusion was identified circumferential to the heart. There was no evidence of hemodynamic compromise.   PHYSICAL EXAM  Temp:  [98.6 F (37 C)-99.4 F (37.4 C)] 98.7 F (37.1 C) (08/19  0815) Pulse Rate:  [98-113] 104 (08/19 0800) Resp:  [17-30] 29 (08/19 0800) BP: (95-142)/(61-85) 115/77 mmHg (08/19 0800) SpO2:  [95 %-100 %] 98 % (08/19 0800) FiO2 (%):  [40 %] 40 % (08/19 0800) Weight:  [193 lb 12.6 oz (87.9 kg)] 193 lb 12.6 oz (87.9 kg) (08/19 0500)  General - obese, well developed, intubated on vent.  Ophthalmologic - Fundi not visualized due to noncooperation.  Cardiovascular - Regular rate and rhythm.  Neuro - intubated on vent, more awake and open eyes spontaneously, but still not following commands. PERRL, able to track objects intermittently, but still left gaze preference but able to cross midline intermittently, doll's eye present, corneal, gag and cough present. RUE 0/5 proximal but 3/5 distally, LUE localize to pain  with spontaneous movement and against gravity, LLE 2/5 on pain, and RLE 2/5 on pain, b/l unsustained ankle clonus. Increased muscle tone RUE and BLE. Babinski mute bilaterally. Sensory, coordination and gait not tested.  ASSESSMENT: [name] is a 59 y.o. female with history of HTN, ESRD on HD, SVT, PFO, previous stroke, COPD, AAA, and recent bowel ischemia leading to resection admitted for right sided weakness, left gaze deviation. She received IV t-PA 11/10/2014 at 1340.   Stroke:  Dominant left MCA infarct embolic secondary to known atrial fibrillation   Resultant  VDRF, right hemiparesis  MRI  Moderate size left MCA territory infarct.  CTA head & neck L M2 branch occlusion.   2D Echo EF 60-65%   TEE 06/23/2014 PFO. Severe RV dysfunction, RAE, TR. Incessant atrial tachycardia  LE venous dopplers negative for DVT  LDL 79  HgbA1c 4.8  SCDs for VTE prophylaxis (listed heparin antibody positive, not able to do arixtra due to ESRD) Diet NPO time specified  aspirin 81 mg orally every day prior to admission, now on 300 mg rectal. Family requested one way extubation likely today but asking for DNI  Ongoing aggressive stroke risk  factor management  Continued need for ICU level care  Therapy recommendations:  pending   Disposition:  pending   Paroxysmal Atrial Fibrillation  Documented on tele 07/27/2014  CHADS-VASc is 4 (stroke, HTN, Female).   Decided not an anticoagulation candidate at that time as risk outweighs benefits given multiple medical issues (see note 07/28/2014 from Dr. Anne Fu -"Ultimately deserves anticoagulation (coumadin). Given her multitude of co morbidities (including fistula) she would not be an optimal candidate for anticoagulation. Risks outweigh benefits. Family understands risk of future stroke/ thrombus.")  May reconsider anticoagulation if family aggressive and pt medical condition improves         Condition Points   C CHF (or Left ventricular systolic dysfunction) 0   H HTN: BP consistently above 140/90 mmHg (or treated HTN on meds)  1   A2 Age ?75 years 0   D DM 0   S2 Prior Stroke or TIA or thromboembolism 2   V Vascular disease (e.g. PAD, MI, aortic plaque) 0   A Age 97-74 years 0   [place] female sex  1           Annual Stroke Risk CHA2DS2-VASc Score Stroke Risk % 95% CI  0 0  1 1.3  2 2.2  3 3.2  4 4.0  5 6.7  6 9.8  7 9.6  8 12.5  9 15.2     Condition Points   H HTN: uncontrolled, >160 mmHg systolic 1   A2 Abnormal renal function: Dialysis, transplant, Cr >2.6 mg/dL or >161 mol/L Abnormal liver function: Cirrhosis or Bilirubin >2x Normal or AST/ALT/AP >3x Normal 1 0   S Prior history of stroke  1   B Bleeding: prior major bleeding or predisposition to bleeding 0  L Labile INR: unstable / high INRs, time in therapeutic range < 60% 0  E Elderly: age > 65 years Medication Usage Predisposing to Bleeding: (Antiplatelet agents, NSAIDs) 0 0  D  Prior alcohol or drug usage history 0    HAS-BLED score n Bleeds, n Bleeds/100 patients  0 798 9 1.13  1 1286 13 1.02  2 744 14 1.88  3 187 7 3.74  4 46 4 8.70  5 8 1  12.50    Previous stroke  Prolonged  hospitalization from 06/17/14 to 09/11/14  Right hemisphere acute infarcts involving right ACA, left PCA, and left MCA, cardioembolic pattern, pt w/ PAF  Home meds include ASA 81mg    Respiratory Failure  Intubated  Likely one way extubation tomorrow  Daughter agrees with DNI but not full DNR  CCM on board   ? Possible seizure  Intermittent, arrhythmic twitching of R hand  Started on Keppra, 1 gm load followed by 500 mg bid  EEG negative for seizure  AcceleratedHypertension   BP 189/118 on arrival  stable  Not on BP meds   Hyperlipidemia  Home meds:  No statin  LDL 79, goal < 70  Add lipitor 10mg  daily  Diabetes  Hypoglycemia over night w/ dextrose given  HgbA1c 4.8, at goal < 7.0  Controlled  Other Stroke Risk Factors  Cigarette smoker  UDS positive for benzos and THC  Obesity, Body mass index is 30.34 kg/(m^2).   Known PFO  Other Active Problems  ESRD on HD T, TH, S  AAA 3 cm by CT 08/2014  Other Pertinent History  Recent admission for bowel ischemia and resection [place] 2016; palliative care heavily involved.  DNI now  Hospital day # 10  This patient is critically ill due to left MCA infarct, with left M2 occlusion, respiratory failure, Afib, ESRD and at significant risk of neurological worsening, death form recurrent infarcts, respiratory failure, heart failure, fluid overload. This patient's care requires constant monitoring of vital signs, hemodynamics, respiratory and cardiac monitoring, review of multiple databases, neurological assessment, discussion with family, other specialists and medical decision making of high complexity. Currently DNI but not full DNR. I spent 35 minutes of neurocritical care time in the care of this patient.  Marvel Plan, MD PhD Stroke Neurology 11/20/2014 8:56 AM  To contact Stroke Continuity provider, please refer to [email or web address]. After hours, contact General Neurology

## 2014-11-21 ENCOUNTER — Inpatient Hospital Stay (HOSPITAL_COMMUNITY): Payer: Medicaid Other

## 2014-11-21 DIAGNOSIS — Z931 Gastrostomy status: Secondary | ICD-10-CM | POA: Diagnosis present

## 2014-11-21 DIAGNOSIS — R0603 Acute respiratory distress: Secondary | ICD-10-CM | POA: Diagnosis present

## 2014-11-21 DIAGNOSIS — R0600 Dyspnea, unspecified: Secondary | ICD-10-CM

## 2014-11-21 LAB — GLUCOSE, CAPILLARY
GLUCOSE-CAPILLARY: 73 mg/dL (ref 65–99)
GLUCOSE-CAPILLARY: 80 mg/dL (ref 65–99)
Glucose-Capillary: 67 mg/dL (ref 65–99)
Glucose-Capillary: 71 mg/dL (ref 65–99)
Glucose-Capillary: 71 mg/dL (ref 65–99)
Glucose-Capillary: 78 mg/dL (ref 65–99)

## 2014-11-21 LAB — RENAL FUNCTION PANEL
ANION GAP: 15 (ref 5–15)
Albumin: 2.2 g/dL — ABNORMAL LOW (ref 3.5–5.0)
BUN: 102 mg/dL — ABNORMAL HIGH (ref 6–20)
CHLORIDE: 104 mmol/L (ref 101–111)
CO2: 22 mmol/L (ref 22–32)
CREATININE: 7.32 mg/dL — AB (ref 0.44–1.00)
Calcium: 8.9 mg/dL (ref 8.9–10.3)
GFR, EST AFRICAN AMERICAN: 6 mL/min — AB (ref >60)
GFR, EST NON AFRICAN AMERICAN: 5 mL/min — AB (ref >60)
Glucose, Bld: 79 mg/dL (ref 65–99)
POTASSIUM: 3.8 mmol/L (ref 3.5–5.1)
Phosphorus: 6.5 mg/dL — ABNORMAL HIGH (ref 2.5–4.6)
Sodium: 141 mmol/L (ref 135–145)

## 2014-11-21 LAB — CBC
HCT: 33.1 % — ABNORMAL LOW (ref 36.0–46.0)
HEMOGLOBIN: 10.3 g/dL — AB (ref 12.0–15.0)
MCH: 27.6 pg (ref 26.0–34.0)
MCHC: 31.1 g/dL (ref 30.0–36.0)
MCV: 88.7 fL (ref 78.0–100.0)
PLATELETS: 267 10*3/uL (ref 150–400)
RBC: 3.73 MIL/uL — AB (ref 3.87–5.11)
RDW: 18.6 % — ABNORMAL HIGH (ref 11.5–15.5)
WBC: 5.4 10*3/uL (ref 4.0–10.5)

## 2014-11-21 MED ORDER — LIDOCAINE HCL (PF) 1 % IJ SOLN: 5.0000 mL | INTRAMUSCULAR | Status: DC | PRN

## 2014-11-21 MED ORDER — ACETYLCYSTEINE 20 % IN SOLN
3.0000 mL | Freq: Four times a day (QID) | RESPIRATORY_TRACT | Status: DC
  Administered 2014-11-21: 3 mL via RESPIRATORY_TRACT
  Administered 2014-11-22: 08:00:00 via RESPIRATORY_TRACT
  Administered 2014-11-22 – 2014-11-23 (×7): 3 mL via RESPIRATORY_TRACT
  Administered 2014-11-24: 21:00:00 via RESPIRATORY_TRACT
  Administered 2014-11-24 – 2014-11-25 (×3): 3 mL via RESPIRATORY_TRACT
  Filled 2014-11-21 (×22): qty 4

## 2014-11-21 MED ORDER — PENTAFLUOROPROP-TETRAFLUOROETH EX AERO: 1.0000 "application " | INHALATION_SPRAY | CUTANEOUS | Status: DC | PRN

## 2014-11-21 MED ORDER — RENA-VITE PO TABS
1.0000 | ORAL_TABLET | Freq: Every day | ORAL | Status: DC
  Administered 2014-11-21 – 2014-11-25 (×4): 1 via ORAL
  Filled 2014-11-21 (×4): qty 1

## 2014-11-21 MED ORDER — ALTEPLASE 2 MG IJ SOLR
2.0000 mg | Freq: Once | INTRAMUSCULAR | Status: DC | PRN
  Filled 2014-11-21: qty 2

## 2014-11-21 MED ORDER — LIDOCAINE-PRILOCAINE 2.5-2.5 % EX CREA: 1.0000 "application " | TOPICAL_CREAM | CUTANEOUS | Status: DC | PRN

## 2014-11-21 MED ORDER — GUAIFENESIN 100 MG/5ML PO SYRP
200.0000 mg | ORAL_SOLUTION | Freq: Three times a day (TID) | ORAL | Status: DC
  Administered 2014-11-21 – 2014-11-25 (×13): 200 mg
  Filled 2014-11-21 (×16): qty 10

## 2014-11-21 MED ORDER — NEPRO/CARBSTEADY PO LIQD: 237.0000 mL | ORAL | Status: DC | PRN

## 2014-11-21 MED ORDER — SODIUM CHLORIDE 0.9 % IV SOLN: 100.0000 mL | INTRAVENOUS | Status: DC | PRN

## 2014-11-21 MED ORDER — HEPARIN SODIUM (PORCINE) 1000 UNIT/ML DIALYSIS: 1000.0000 [IU] | INTRAMUSCULAR | Status: DC | PRN

## 2014-11-21 NOTE — Progress Notes (Signed)
Subjective: Interval History: not conversant or coop.  Objective: Vital signs in last 24 hours: Temp:  [98.3 F (36.8 C)-99 F (37.2 C)] 98.6 F (37 C) (08/20 0343) Pulse Rate:  [97-115] 101 (08/20 0800) Resp:  [17-34] 21 (08/20 0800) BP: (89-161)/(57-91) 114/71 mmHg (08/20 0800) SpO2:  [91 %-100 %] 95 % (08/20 0800) FiO2 (%):  [40 %] 40 % (08/19 1114) Weight:  [87.4 kg (192 lb 10.9 oz)] 87.4 kg (192 lb 10.9 oz) (08/20 0500) Weight change: -0.8 kg (-1 lb 12.2 oz)  Intake/Output from previous day: 08/19 0701 - 08/20 0700 In: 750 [I.V.:240; NG/GT:150] Out: -  Intake/Output this shift: Total I/O In: 50 [Other:50] Out: -   General appearance: moderately obese and as above, L gaze pref, not responding Resp: diminished breath sounds bilaterally and rhonchi bibasilar Chest wall: RIJ cath Cardio: S1, S2 normal and systolic murmur: holosystolic 2/6, blowing at apex GI: multiple scars PEG mid abdm, pos bx Extremities: AVF LUA B&T  Lab Results:  Recent Labs  11/20/14 0243  WBC 6.3  HGB 9.5*  HCT 30.5*  PLT 269   BMET:  Recent Labs  11/19/14 0841 11/20/14 0243  NA 141 141  K 5.0 3.4*  CL 102 102  CO2 27 27  GLUCOSE 93 97  BUN 99* 70*  CREATININE 6.89* 5.79*  CALCIUM 8.8* 8.8*   No results for input(s): PTH in the last 72 hours. Iron Studies: No results for input(s): IRON, TIBC, TRANSFERRIN, FERRITIN in the last 72 hours.  Studies/Results: No results found.  I have reviewed the patient's current medications.  Assessment/Plan: 1 ESRD for HD, if cont will use avf in next few tx 2 CVA major limitations , for swallow 3  Tobacco abuse 4 Obesity 5 Anemia cont esa 6Resp failure, extub, for swallow study 7HPTH P HD, use avf soon. Swallow study    LOS: 11 days   Garfield Coiner L 11/21/2014,8:31 AM

## 2014-11-21 NOTE — Progress Notes (Signed)
Did not do CPT at this time due to HR of 133

## 2014-11-21 NOTE — Progress Notes (Signed)
STROKE TEAM PROGRESS NOTE   HISTORY [name] is an 59 y.o. female who was at dialysis and finished her session. After dialysis while still at the center she was noted to have sudden onset right sided weakness, left gaze deviation (LKW 11/10/2014 at 1106). EMS was called and on site BG was noted to be 50. Patient was given D50 and was transported to [facility] ED. On arrival she had labored respirations was not protecting her airway, and required intubation. CT of head was delayed due to need for intubation. CT head obtained and showed no acte stroke or bleed. Patient was deemed to be a candidate for TPA and was brought back to the ED. Dialysis needles were still present in the patient's AV fistula. IV team was consulted and removed the hemodialysis needles as well as achieved hemostasis. IV tPA was then administered. CT angiogram of the head and neck was obtained which showed no large vessel proximal occlusion. She was subsequently admitted to the neuro intensive care unit for further management.    SUBJECTIVE (INTERVAL HISTORY) She was extubated yesterday. Had copious secretion and respiratory distress. Given nasal trumpet and atropine. Pt this morning respiratory distress better, but still wheezing with secretions. Ordered chest PT, mucomyst nebs and NT suction. HD today.  OBJECTIVE Temp:  [98.3 F (36.8 C)-99 F (37.2 C)] 98.6 F (37 C) (08/20 0343) Pulse Rate:  [97-115] 101 (08/20 0800) Cardiac Rhythm:  [-] Normal sinus rhythm (08/20 0800) Resp:  [17-34] 21 (08/20 0800) BP: (89-161)/(57-91) 114/71 mmHg (08/20 0800) SpO2:  [91 %-100 %] 95 % (08/20 0800) FiO2 (%):  [40 %] 40 % (08/19 1114) Weight:  [192 lb 10.9 oz (87.4 kg)] 192 lb 10.9 oz (87.4 kg) (08/20 0500)   Recent Labs Lab 11/20/14 1614 11/20/14 1933 11/20/14 2330 11/21/14 0013 11/21/14 0332  GLUCAP 79 81 64* 71 71    Recent Labs Lab 11/16/14 0343 11/17/14 0249 11/18/14 0217 11/19/14 0841 11/20/14 0243  NA 140 137 140  141 141  K 4.4 4.3 3.5 5.0 3.4*  CL 103 100* 101 102 102  CO2 21* 21* 28 27 27   GLUCOSE 79 93 99 93 97  BUN 95* 119* 62* 99* 70*  CREATININE 7.63* 8.37* 5.19* 6.89* 5.79*  CALCIUM 8.8* 8.8* 8.6* 8.8* 8.8*  MG  --  2.1 1.9  --   --   PHOS  --  6.3* 3.9 5.8*  --     Recent Labs Lab 11/16/14 0343 11/19/14 0841  AST 22  --   ALT 11*  --   ALKPHOS 67  --   BILITOT 0.8  --   PROT 6.4*  --   ALBUMIN 2.1* 2.0*    Recent Labs Lab 11/16/14 0343 11/17/14 0249 11/18/14 0217 11/20/14 0243  WBC 6.6 7.2 7.5 6.3  HGB 10.8* 10.5* 11.0* 9.5*  HCT 33.5* 33.0* 34.4* 30.5*  MCV 87.0 88.9 89.8 88.7  PLT 179 242 262 269   No results for input(s): CKTOTAL, CKMB, CKMBINDEX, TROPONINI in the last 168 hours. No results for input(s): LABPROT, INR in the last 72 hours. No results for input(s): COLORURINE, LABSPEC, PHURINE, GLUCOSEU, HGBUR, BILIRUBINUR, KETONESUR, PROTEINUR, UROBILINOGEN, NITRITE, LEUKOCYTESUR in the last 72 hours.  Invalid input(s): APPERANCEUR     Component Value Date/Time   CHOL 135 11/11/2014 0330   TRIG 95 11/11/2014 0330   HDL 37* 11/11/2014 0330   CHOLHDL 3.6 11/11/2014 0330   VLDL 19 11/11/2014 0330   LDLCALC 79 11/11/2014 0330  Lab Results  Component Value Date   HGBA1C 4.8 11/11/2014      Component Value Date/Time   LABOPIA NONE DETECTED 11/10/2014 1336   COCAINSCRNUR NONE DETECTED 11/10/2014 1336   LABBENZ POSITIVE* 11/10/2014 1336   AMPHETMU NONE DETECTED 11/10/2014 1336   THCU POSITIVE* 11/10/2014 1336   LABBARB NONE DETECTED 11/10/2014 1336    No results for input(s): ETH in the last 168 hours.   IMAGING I have personally reviewed the radiological images below and agree with the radiology interpretations.  MRI Brain without contrast 11/12/2014 1. Moderate size left MCA territory infarct affecting the left insula and operculum. No associated hemorrhage or mass effect. 2. Expected evolution of the posterior right ACA and posterior left MCA  infarcts seen in March.  Ct Head Wo Contrast 11/10/2014    No evidence for post t-PA hemorrhage. Early cytotoxic edema now noted in the LEFT hemisphere.    11/10/2014   1. No acute intracranial pathology.     Ct Angio Head & Neck W/cm &/or Wo/cm 11/10/2014    1. 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  2. No proximal large vessel occlusion.  3. Moderate irregular narrowing and attenuation of ACA branch vessels, progressed from prior MRA.  4. Mild-to-moderate plaque in the cervical carotid arteries without stenosis.  5. Mild proximal left vertebral artery stenosis.    Portable Chest Xray 11/11/2014    1. Lines and tubes in stable position. 2. New onset of dense left lower lobe atelectasis and consolidation. Small left pleural effusion.    11/10/2014   Endotracheal tube terminates 3 cm above the carina.     EEG : mild global cerebral dysfunction as seen in a wide variety of toxic metabolic encephalopathies and degenerative brain disorders. No electrographic seizures noted.  2D-Echo 11/12/14 - Left ventricle: The cavity size was moderately reduced. Wall thickness was increased in a pattern of severe LVH. Systolic function was normal. The estimated ejection fraction was in the range of 60% to 65%. Wall motion was normal; there were no regional wall motion abnormalities. - Right ventricle: The cavity size was mildly dilated. Systolic function was mildly reduced. - Right atrium: The atrium was severely dilated. - Tricuspid valve: There was severe regurgitation. - Pulmonary arteries: Systolic pressure was severely increased. PA peak pressure: 86 mm Hg (S). - Pericardium, extracardiac: A small pericardial effusion was identified circumferential to the heart. There was no evidence of hemodynamic compromise.   PHYSICAL EXAM  Temp:  [98.3 F (36.8 C)-99 F (37.2 C)] 98.6 F (37 C) (08/20 0343) Pulse Rate:  [97-115] 101 (08/20 0800) Resp:  [17-34] 21 (08/20  0800) BP: (89-161)/(57-91) 114/71 mmHg (08/20 0800) SpO2:  [91 %-100 %] 95 % (08/20 0800) FiO2 (%):  [40 %] 40 % (08/19 1114) Weight:  [192 lb 10.9 oz (87.4 kg)] 192 lb 10.9 oz (87.4 kg) (08/20 0500)  General - obese, well developed, extubated on nasal trumpet.  Ophthalmologic - Fundi not visualized due to noncooperation.  Cardiovascular - Regular rate and rhythm.  Lung - bilateral coarse rhonchi and wheezing sound, prolonged expiration.  Neuro - extubated, on right nasal trumpet, mild to moderate respiratory distress. Awake and open eyes spontaneously, but still not following commands. PERRL, able to track objects intermittently, but still left gaze preference but able to cross midline intermittently, doll's eye present, corneal, gag and cough present. RUE 0/5 proximal but 3/5 distally, LUE localize to pain with spontaneous movement and against gravity, LLE 2/5 on pain, and RLE  2/5 on pain, b/l unsustained ankle clonus. Increased muscle tone RUE and BLE. Babinski mute bilaterally. Sensory, coordination and gait not tested.  ASSESSMENT: [name] is a 59 y.o. female with history of HTN, ESRD on HD, SVT, PFO, previous stroke, COPD, AAA, and recent bowel ischemia leading to resection admitted for right sided weakness, left gaze deviation. She received IV t-PA 11/10/2014 at 1340.   Stroke:  Dominant left MCA infarct embolic secondary to known atrial fibrillation   Resultant  VDRF, right hemiparesis  MRI  Moderate size left MCA territory infarct.  CTA head & neck L M2 branch occlusion.   2D Echo EF 60-65%   TEE 06/23/2014 PFO. Severe RV dysfunction, RAE, TR. Incessant atrial tachycardia  LE venous dopplers negative for DVT  LDL 79  HgbA1c 4.8  SCDs for VTE prophylaxis (listed heparin antibody positive, not able to do arixtra due to ESRD) Diet NPO time specified  aspirin 81 mg orally every day prior to admission, now on 300 mg rectal. Family requested one way extubation likely  today but asking for DNI  Ongoing aggressive stroke risk factor management  Continued need for ICU level care  Therapy recommendations:  pending   Disposition:  pending   Paroxysmal Atrial Fibrillation  Documented on tele 07/27/2014  CHADS-VASc is 4 (stroke, HTN, Female).   Decided not an anticoagulation candidate at that time as risk outweighs benefits given multiple medical issues (see note 07/28/2014 from Dr. Anne Fu -"Ultimately deserves anticoagulation (coumadin). Given her multitude of co morbidities (including fistula) she would not be an optimal candidate for anticoagulation. Risks outweigh benefits. Family understands risk of future stroke/ thrombus.")  May reconsider anticoagulation if family aggressive and pt medical condition improves         Condition Points   C CHF (or Left ventricular systolic dysfunction) 0   H HTN: BP consistently above 140/90 mmHg (or treated HTN on meds)  1   A2 Age ?75 years 0   D DM 0   S2 Prior Stroke or TIA or thromboembolism 2   V Vascular disease (e.g. PAD, MI, aortic plaque) 0   A Age 21-74 years 0   Garden female sex  1           Annual Stroke Risk CHA2DS2-VASc Score Stroke Risk % 95% CI  0 0  1 1.3  2 2.2  3 3.2  4 4.0  5 6.7  6 9.8  7 9.6  8 12.5  9 15.2     Condition Points   H HTN: uncontrolled, >160 mmHg systolic 1   A2 Abnormal renal function: Dialysis, transplant, Cr >2.6 mg/dL or >161 mol/L Abnormal liver function: Cirrhosis or Bilirubin >2x Normal or AST/ALT/AP >3x Normal 1 0   S Prior history of stroke  1   B Bleeding: prior major bleeding or predisposition to bleeding 0  L Labile INR: unstable / high INRs, time in therapeutic range < 60% 0  E Elderly: age > 65 years Medication Usage Predisposing to Bleeding: (Antiplatelet agents, NSAIDs) 0 0  D  Prior alcohol or drug usage history 0    HAS-BLED score n Bleeds, n Bleeds/100 patients  0 798 9 1.13  1 1286 13 1.02  2 744 14 1.88  3 187 7 3.74  4 46 4 8.70  5  8 1  12.50    Previous stroke  Prolonged hospitalization from 06/17/14 to 09/11/14  Right hemisphere acute infarcts involving right ACA, left PCA, and left MCA,  cardioembolic pattern, pt w/ PAF  Home meds include ASA 81mg    Respiratory distress  Extubated 11/20/14  Copious secretions  Put on chest PT Q4, NT suction and mucomyst nebs Q6  Has albuterol PRN  Has atrovent Q6  Code status DNR now  CCM on board   ? Possible seizure  Intermittent, arrhythmic twitching of R hand  Started on Keppra, 1 gm load followed by 500 mg bid  EEG negative for seizure  AcceleratedHypertension   BP 189/118 on arrival  stable  Not on BP meds   Hyperlipidemia  Home meds:  No statin  LDL 79, goal < 70  On lipitor 10mg  daily  Continue statin on discharge  Diabetes  Hypoglycemia over night w/ dextrose given  HgbA1c 4.8, at goal < 7.0  Controlled  Other Stroke Risk Factors  Cigarette smoker  UDS positive for benzos and THC  Obesity, Body mass index is 30.17 kg/(m^2).   Known PFO  Other Active Problems  ESRD on HD T, TH, S  AAA 3 cm by CT 08/2014  Other Pertinent History  Recent admission for bowel ischemia and resection [place] 2016; palliative care heavily involved.  DNR  Hospital day # 11  This patient is critically ill due to left MCA infarct, with left M2 occlusion, respiratory failure, Afib, ESRD and at significant risk of neurological worsening, death form recurrent infarcts, respiratory failure, heart failure, fluid overload. This patient's care requires constant monitoring of vital signs, hemodynamics, respiratory and cardiac monitoring, review of multiple databases, neurological assessment, discussion with family, other specialists and medical decision making of high complexity. Code status DNR. Extubated but has respiratory distress, requiring acute management of respiratory distress. I spent 40 minutes of neurocritical care time in the care of this  patient.  Marvel Plan, MD PhD Stroke Neurology 11/21/2014 8:50 AM  To contact Stroke Continuity provider, please refer to [email or web address]. After hours, contact General Neurology

## 2014-11-21 NOTE — Plan of Care (Signed)
Problem: Progression Outcomes Goal: If vent dependent, tolerates weaning Outcome: Completed/Met Date Met:  11/21/14 Extubated 8/19

## 2014-11-21 NOTE — Progress Notes (Signed)
PULMONARY / CRITICAL CARE MEDICINE   Name: [name] MRN: [id number] DOB: [birth year]    ADMISSION DATE:  11/10/2014 CONSULTATION DATE:  11/10/2014  REFERRING MD :  Pearlean Brownie  CHIEF COMPLAINT:  Stroke, post TPA  INITIAL PRESENTATION:  This is a 59 y/o female who had an acute left MCA stroke 8/9 and received IV TPA.  She was intubated for airway protection in the ER.  She has a long history of ESRD, a recent R ACA stroke, and a recent history of ischemic bowel requiring resection.  On 8/9  at dialysis she developed sudden onset of R sided weakness.  She was brought to the ER for further evaluation.  No further history could be obtained due to intubation.  Of note, after TPA she had bleeding from her AV fistula   STUDIES/Significant events:  8/9 CT head > no acute abnormality 8/9 CT angiogram post tpa> L MCA m2 superior division branch vessel occlusion, moderate irregular narrowing and attenuation of ACA branch vessels, progressed from prior MRA 8/9 admission, IV TPA for L MCA stroke  11/11/14: CT head pending. Neuro Dr Pearlean Brownie discussing goals with dtr. Dtr denied any questions to me. On prn sedation - receieved none - followed some command with delayed response but somnolent  11/13/2014: 11/12/14 Moderate MCAon MRI yesterday.  Still on vent. Family not at bedside. No change noted other than agitation overnight with same during SBT today. Lot of resp sexcretions. RN says dtr - very reluctant for DNR  11/14/14: Dr Pearlean Brownie goals of care - full code. Remains aphasic with right hemiparesis. LE duplex negative for DVT. On HD.   8/19: conversation with PCCM> full no code blue decided as CPR without intubation not medically beneficial; extubated, nasal trumpet placed  SUBJECTIVE/OVERNIGHT/INTERVAL HX Extubated, loud upper airway noises, nasal trumpet; breathing was more labored yesterday than now by report  VITAL SIGNS: Temp:  [98.3 F (36.8 C)-99 F (37.2 C)] 98.6 F (37 C) (08/20 0343) Pulse Rate:   [97-115] 101 (08/20 0800) Resp:  [17-34] 21 (08/20 0800) BP: (89-161)/(57-91) 114/71 mmHg (08/20 0800) SpO2:  [91 %-100 %] 95 % (08/20 0800) FiO2 (%):  [40 %] 40 % (08/19 1114) Weight:  [192 lb 10.9 oz (87.4 kg)] 192 lb 10.9 oz (87.4 kg) (08/20 0500)   HEMODYNAMICS:   VENTILATOR SETTINGS: Vent Mode:  [-] CPAP;PSV FiO2 (%):  [40 %] 40 % PEEP:  [5 cmH20] 5 cmH20 Pressure Support:  [8 cmH20] 8 cmH20   INTAKE / OUTPUT:  Intake/Output Summary (Last 24 hours) at 11/21/14 0837 Last data filed at 11/21/14 0800  Gross per 24 hour  Intake    760 ml  Output      0 ml  Net    760 ml   PHYSICAL EXAMINATION: Genera chronically ill appearing HENT: nasal trumpet in place PULM: upper airway rhonchi, mildly increased respiratory effort, good air movement CV: RRR, no clear MGR on my exam GI: BS+, soft Ext: trace edema Neuro: leftward gaze, eyes open, not interactive for me  LABS: PULMONARY  Recent Labs Lab 11/17/14 0344  PHART 7.361  PCO2ART 41.2  PO2ART 84.2  HCO3 22.7  TCO2 24.0  O2SAT 94.8   CBC  Recent Labs Lab 11/17/14 0249 11/18/14 0217 11/20/14 0243  HGB 10.5* 11.0* 9.5*  HCT 33.0* 34.4* 30.5*  WBC 7.2 7.5 6.3  PLT 242 262 269   COAGULATION No results for input(s): INR in the last 168 hours. CARDIAC  No results for input(s): TROPONINI in  the last 168 hours. No results for input(s): PROBNP in the last 168 hours.  CHEMISTRY  Recent Labs Lab 11/16/14 0343 11/17/14 0249 11/18/14 0217 11/19/14 0841 11/20/14 0243  NA 140 137 140 141 141  K 4.4 4.3 3.5 5.0 3.4*  CL 103 100* 101 102 102  CO2 21* 21* GLUCOSE 79 93 99 93 97  BUN 95* 119* 62* 99* 70*  CREATININE 7.63* 8.37* 5.19* 6.89* 5.79*  CALCIUM 8.8* 8.8* 8.6* 8.8* 8.8*  MG  --  2.1 1.9  --   --   PHOS  --  6.3* 3.9 5.8*  --    Estimated Creatinine Clearance: 12 mL/min (by C-G formula based on Cr of 5.79).  LIVER  Recent Labs Lab 11/16/14 0343 11/19/14 0841  AST 22  --   ALT 11*   --   ALKPHOS 67  --   BILITOT 0.8  --   PROT 6.4*  --   ALBUMIN 2.1* 2.0*   INFECTIOUS No results for input(s): LATICACIDVEN, PROCALCITON in the last 168 hours.  ENDOCRINE CBG (last 3)   Recent Labs  11/20/14 2330 11/21/14 0013 11/21/14 0332  GLUCAP 64* 71 71   IMAGING x48h   No results found.   ASSESSMENT / PLAN:  PULMONARY OETT 8/9 > 8/19  A:  High aspiration risk, minimal ability to protect airway Extubated 8/19, has some upper airway secretions but no stridor Do not think she has a lower airway (lung) issue here, mostly upper airway P:   Check chest x-ray Continue NTS suction prn NPO Nasal trumpet Continue pulmicort bid, Xopenex Add mucinex to help thin secretions  CARDIOVASCULAR  A:  Hypertension > well controlled Pulmonary hypertension by echo> cause uncertain, has not had RHC PFO History of aortic aneurysm P:  Tele monitoring for now  RENAL A:   ESRD P:   HD per Renal   GASTROINTESTINAL A:   Recent ischemic bowel, perforation, treated with surgery in [place] 2016 No acute issues now  P:   OK to restart tube feeding, monitor carefully for aspiration risk Continue PPI  HEMATOLOGIC A:  S/p tPA 11/10/2014  P:  Monitor for bleeding   INFECTIOUS A:  No acute issues  P:   Monitor for fever  ENDOCRINE A:  Hypoglycemia - resolved with TF.  P:   Monitor glucose  NEUROLOGIC A:   Acute L MCA stroke Acute encephalopathy post stroke> med related from intubation? ICH?  P:   Hold sedation Post stroke care per neurology  FAMILY  - Updates: 8/19 Daughter with multiple family members> discussed with Anders Simmonds NP and they decided to change code status to full DNR but want continued full medical support otherwise  Global: Poor prognosis, would favor moving towards full palliative approach but understand family has been reluctant to do this.  From my perspective she has little quality of life now and moving towards comfort measures only  is very reasonable.  Heber [place], MD [facility] Pager: [phone] Cell: [phone] After 3pm or if no response, call [phone]

## 2014-11-22 DIAGNOSIS — Z66 Do not resuscitate: Secondary | ICD-10-CM

## 2014-11-22 DIAGNOSIS — J9811 Atelectasis: Secondary | ICD-10-CM

## 2014-11-22 DIAGNOSIS — I63412 Cerebral infarction due to embolism of left middle cerebral artery: Secondary | ICD-10-CM | POA: Diagnosis present

## 2014-11-22 LAB — BASIC METABOLIC PANEL
ANION GAP: 13 (ref 5–15)
BUN: 47 mg/dL — AB (ref 6–20)
CALCIUM: 8.5 mg/dL — AB (ref 8.9–10.3)
CO2: 27 mmol/L (ref 22–32)
Chloride: 98 mmol/L — ABNORMAL LOW (ref 101–111)
Creatinine, Ser: 4.09 mg/dL — ABNORMAL HIGH (ref 0.44–1.00)
GFR calc Af Amer: 13 mL/min — ABNORMAL LOW (ref >60)
GFR, EST NON AFRICAN AMERICAN: 11 mL/min — AB (ref >60)
GLUCOSE: 97 mg/dL (ref 65–99)
Potassium: 3.2 mmol/L — ABNORMAL LOW (ref 3.5–5.1)
SODIUM: 138 mmol/L (ref 135–145)

## 2014-11-22 LAB — CBC
HCT: 33.5 % — ABNORMAL LOW (ref 36.0–46.0)
Hemoglobin: 10.5 g/dL — ABNORMAL LOW (ref 12.0–15.0)
MCH: 27.8 pg (ref 26.0–34.0)
MCHC: 31.3 g/dL (ref 30.0–36.0)
MCV: 88.6 fL (ref 78.0–100.0)
PLATELETS: 265 10*3/uL (ref 150–400)
RBC: 3.78 MIL/uL — ABNORMAL LOW (ref 3.87–5.11)
RDW: 18.4 % — AB (ref 11.5–15.5)
WBC: 5.5 10*3/uL (ref 4.0–10.5)

## 2014-11-22 LAB — GLUCOSE, CAPILLARY
GLUCOSE-CAPILLARY: 73 mg/dL (ref 65–99)
GLUCOSE-CAPILLARY: 76 mg/dL (ref 65–99)
GLUCOSE-CAPILLARY: 96 mg/dL (ref 65–99)
GLUCOSE-CAPILLARY: 98 mg/dL (ref 65–99)
Glucose-Capillary: 111 mg/dL — ABNORMAL HIGH (ref 65–99)
Glucose-Capillary: 75 mg/dL (ref 65–99)

## 2014-11-22 MED ORDER — POTASSIUM CHLORIDE CRYS ER 20 MEQ PO TBCR
40.0000 meq | EXTENDED_RELEASE_TABLET | ORAL | Status: AC
Start: 2014-11-22 — End: 2014-11-22
  Administered 2014-11-22 (×2): 40 meq via ORAL
  Filled 2014-11-22 (×2): qty 2

## 2014-11-22 NOTE — Progress Notes (Signed)
Subjective: Interval History: non interactive.  Objective: Vital signs in last 24 hours: Temp:  [98.2 F (36.8 C)-98.9 F (37.2 C)] 98.6 F (37 C) (08/21 0812) Pulse Rate:  [98-111] 100 (08/21 0800) Resp:  [20-35] 28 (08/21 0800) BP: (85-186)/(43-118) 106/77 mmHg (08/21 0800) SpO2:  [90 %-99 %] 97 % (08/21 0800) Weight:  [86.4 kg (190 lb 7.6 oz)-97.3 kg (214 lb 8.1 oz)] 86.4 kg (190 lb 7.6 oz) (08/21 0356) Weight change: 9.9 kg (21 lb 13.2 oz)  Intake/Output from previous day: 08/20 0701 - 08/21 0700 In: 1270 [NG/GT:1090] Out: 1000  Intake/Output this shift: Total I/O In: 40 [Other:10; NG/GT:30] Out: -   General appearance: no distress and not coop and no interaction, eyes open Back: not eval Chest wall: diffuse rhonchi in bases and mid lungs, R IJ cath. Cardio: S1, S2 normal and systolic murmur: holosystolic 2/6, blowing at apex GI: obese, many scars, PEG mid upper abdm Extremities: AVF LUA  Lab Results:  Recent Labs  11/21/14 0900 11/22/14 0208  WBC 5.4 5.5  HGB 10.3* 10.5*  HCT 33.1* 33.5*  PLT 267 265   BMET:  Recent Labs  11/21/14 0859 11/22/14 0208  NA 141 138  K 3.8 3.2*  CL 104 98*  CO2 22 27  GLUCOSE 79 97  BUN 102* 47*  CREATININE 7.32* 4.09*  CALCIUM 8.9 8.5*   No results for input(s): PTH in the last 72 hours. Iron Studies: No results for input(s): IRON, TIBC, TRANSFERRIN, FERRITIN in the last 72 hours.  Studies/Results: Dg Chest Port 1 View  11/21/2014   CLINICAL DATA:  Respiratory distress  EXAM: PORTABLE CHEST - 1 VIEW  COMPARISON:  11/17/2014  FINDINGS: Cardiac shadow remains enlarged. The endotracheal tube is been removed. A dialysis catheter is again identified within the proximal superior vena cava. Right lung remains clear. Persistent consolidation in the left lower lobe is noted and stable. No new focal abnormality is seen.  IMPRESSION: No change from the prior exam. Persistent left lower lobe consolidation is noted.   Electronically  Signed   By: Alcide Clever M.D.   On: 11/21/2014 09:50    I have reviewed the patient's current medications.  Assessment/Plan: 1ESRD will start using AVF this week.  Did well on HD yest 2 Anemia ESA 3 H:PTH 4 Obesity 5 CVA 6 Nutirit on TF P HD TTS, TF.  Unless improves, not candidate for outpatient dialysis    LOS: 12 days   Karrissa Parchment L 11/22/2014,9:07 AM

## 2014-11-22 NOTE — Progress Notes (Addendum)
STROKE TEAM PROGRESS NOTE   HISTORY [name] is an 59 y.o. female who was at dialysis and finished her session. After dialysis while still at the center she was noted to have sudden onset right sided weakness, left gaze deviation (LKW 11/10/2014 at 1106). EMS was called and on site BG was noted to be 50. Patient was given D50 and was transported to [facility] ED. On arrival she had labored respirations was not protecting her airway, and required intubation. CT of head was delayed due to need for intubation. CT head obtained and showed no acte stroke or bleed. Patient was deemed to be a candidate for TPA and was brought back to the ED. Dialysis needles were still present in the patient's AV fistula. IV team was consulted and removed the hemodialysis needles as well as achieved hemostasis. IV tPA was then administered. CT angiogram of the head and neck was obtained which showed no large vessel proximal occlusion. She was subsequently admitted to the neuro intensive care unit for further management.    SUBJECTIVE (INTERVAL HISTORY) She was extubated on 8/19 and initially not tolerating well, put on nasal trumpet and atropine. Yesterday pt less respiratory distress but still had copious secretion and wheezing. Ordered chest PT, mucomyst nebs and NT suction. This morning, pt breathing much better, still has copious secretion, had NT suction last night. On breathing treatment. Pt more calm and awake, transfer to floor. Will consult IM for continued management.   OBJECTIVE Temp:  [98.2 F (36.8 C)-98.9 F (37.2 C)] 98.6 F (37 C) (08/21 0812) Pulse Rate:  [98-111] 100 (08/21 0800) Cardiac Rhythm:  [-] Normal sinus rhythm (08/21 0800) Resp:  [20-35] 28 (08/21 0800) BP: (85-186)/(43-118) 106/77 mmHg (08/21 0800) SpO2:  [90 %-99 %] 97 % (08/21 0800) Weight:  [190 lb 7.6 oz (86.4 kg)-214 lb 8.1 oz (97.3 kg)] 190 lb 7.6 oz (86.4 kg) (08/21 0356)   Recent Labs Lab 11/21/14 1208 11/21/14 1955  11/21/14 2324 11/22/14 0402 11/22/14 0811  GLUCAP 73 80 78 96 98    Recent Labs Lab 11/17/14 0249 11/18/14 0217 11/19/14 0841 11/20/14 0243 11/21/14 0859 11/22/14 0208  NA 137 140 141 141 141 138  K 4.3 3.5 5.0 3.4* 3.8 3.2*  CL 100* 101 102 102 104 98*  CO2 21* 28 27 27 22 27   GLUCOSE 93 99 93 97 79 97  BUN 119* 62* 99* 70* 102* 47*  CREATININE 8.37* 5.19* 6.89* 5.79* 7.32* 4.09*  CALCIUM 8.8* 8.6* 8.8* 8.8* 8.9 8.5*  MG 2.1 1.9  --   --   --   --   PHOS 6.3* 3.9 5.8*  --  6.5*  --     Recent Labs Lab 11/16/14 0343 11/19/14 0841 11/21/14 0859  AST 22  --   --   ALT 11*  --   --   ALKPHOS 67  --   --   BILITOT 0.8  --   --   PROT 6.4*  --   --   ALBUMIN 2.1* 2.0* 2.2*    Recent Labs Lab 11/17/14 0249 11/18/14 0217 11/20/14 0243 11/21/14 0900 11/22/14 0208  WBC 7.2 7.5 6.3 5.4 5.5  HGB 10.5* 11.0* 9.5* 10.3* 10.5*  HCT 33.0* 34.4* 30.5* 33.1* 33.5*  MCV 88.9 89.8 88.7 88.7 88.6  PLT 242 262 269 267 265   No results for input(s): CKTOTAL, CKMB, CKMBINDEX, TROPONINI in the last 168 hours. No results for input(s): LABPROT, INR in the last 72 hours. No  results for input(s): COLORURINE, LABSPEC, PHURINE, GLUCOSEU, HGBUR, BILIRUBINUR, KETONESUR, PROTEINUR, UROBILINOGEN, NITRITE, LEUKOCYTESUR in the last 72 hours.  Invalid input(s): APPERANCEUR     Component Value Date/Time   CHOL 135 11/11/2014 0330   TRIG 95 11/11/2014 0330   HDL 37* 11/11/2014 0330   CHOLHDL 3.6 11/11/2014 0330   VLDL 19 11/11/2014 0330   LDLCALC 79 11/11/2014 0330   Lab Results  Component Value Date   HGBA1C 4.8 11/11/2014      Component Value Date/Time   LABOPIA NONE DETECTED 11/10/2014 1336   COCAINSCRNUR NONE DETECTED 11/10/2014 1336   LABBENZ POSITIVE* 11/10/2014 1336   AMPHETMU NONE DETECTED 11/10/2014 1336   THCU POSITIVE* 11/10/2014 1336   LABBARB NONE DETECTED 11/10/2014 1336    No results for input(s): ETH in the last 168 hours.   IMAGING I have personally  reviewed the radiological images below and agree with the radiology interpretations.  MRI Brain without contrast 11/12/2014 1. Moderate size left MCA territory infarct affecting the left insula and operculum. No associated hemorrhage or mass effect. 2. Expected evolution of the posterior right ACA and posterior left MCA infarcts seen in March.  Ct Head Wo Contrast 11/10/2014    No evidence for post t-PA hemorrhage. Early cytotoxic edema now noted in the LEFT hemisphere.    11/10/2014   1. No acute intracranial pathology.     Ct Angio Head & Neck W/cm &/or Wo/cm 11/10/2014    1. 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  2. No proximal large vessel occlusion.  3. Moderate irregular narrowing and attenuation of ACA branch vessels, progressed from prior MRA.  4. Mild-to-moderate plaque in the cervical carotid arteries without stenosis.  5. Mild proximal left vertebral artery stenosis.    Portable Chest Xray 11/21/2014   IMPRESSION: No change from the prior exam. Persistent left lower lobe consolidation is noted.    11/11/2014    1. Lines and tubes in stable position. 2. New onset of dense left lower lobe atelectasis and consolidation. Small left pleural effusion.    11/10/2014   Endotracheal tube terminates 3 cm above the carina.     EEG : mild global cerebral dysfunction as seen in a wide variety of toxic metabolic encephalopathies and degenerative brain disorders. No electrographic seizures noted.  2D-Echo 11/12/14 - Left ventricle: The cavity size was moderately reduced. Wall thickness was increased in a pattern of severe LVH. Systolic function was normal. The estimated ejection fraction was in the range of 60% to 65%. Wall motion was normal; there were no regional wall motion abnormalities. - Right ventricle: The cavity size was mildly dilated. Systolic function was mildly reduced. - Right atrium: The atrium was severely dilated. - Tricuspid valve: There was severe  regurgitation. - Pulmonary arteries: Systolic pressure was severely increased. PA peak pressure: 86 mm Hg (S). - Pericardium, extracardiac: A small pericardial effusion was identified circumferential to the heart. There was no evidence of hemodynamic compromise.   PHYSICAL EXAM  Temp:  [98.2 F (36.8 C)-98.9 F (37.2 C)] 98.6 F (37 C) (08/21 0812) Pulse Rate:  [98-111] 100 (08/21 0800) Resp:  [20-35] 28 (08/21 0800) BP: (85-186)/(43-118) 106/77 mmHg (08/21 0800) SpO2:  [90 %-99 %] 97 % (08/21 0800) Weight:  [190 lb 7.6 oz (86.4 kg)-214 lb 8.1 oz (97.3 kg)] 190 lb 7.6 oz (86.4 kg) (08/21 0356)  General - obese, well developed, on nasal trumpet, respiratory distress much improved from yesterday.  Ophthalmologic - Fundi not visualized due to  noncooperation.  Cardiovascular - Regular rate and rhythm.  Lung - bilateral coarse rhonchi and wheezing sound.  Neuro - On right nasal trumpet, mild respiratory distress. Awake and open eyes spontaneously, but still not following commands. PERRL, able to track objects intermittently, but still left gaze preference but able to cross midline intermittently, doll's eye present, corneal, gag and cough present. RUE 0/5 proximal but 3/5 distally, LUE localize to pain with spontaneous movement and against gravity, LLE 2/5 on pain, and RLE 2/5 on pain, b/l unsustained ankle clonus. Increased muscle tone RUE and BLE. Babinski mute bilaterally. Sensory, coordination and gait not tested.  ASSESSMENT: [name] is a 59 y.o. female with history of HTN, ESRD on HD, SVT, PFO, previous stroke, COPD, AAA, and recent bowel ischemia leading to resection admitted for right sided weakness, left gaze deviation. She received IV t-PA 11/10/2014 at 1340.   Stroke:  Dominant left MCA infarct embolic secondary to known atrial fibrillation   Resultant  VDRF, right hemiparesis  MRI  Moderate size left MCA territory infarct.  CTA head & neck L M2 branch  occlusion.   2D Echo EF 60-65%   TEE 06/23/2014 PFO. Severe RV dysfunction, RAE, TR. Incessant atrial tachycardia  LE venous dopplers negative for DVT  LDL 79  HgbA1c 4.8  SCDs for VTE prophylaxis (listed heparin antibody positive, not able to do arixtra due to ESRD, however, pt was giving heparin for HD) Diet NPO time specified  aspirin 81 mg orally every day prior to admission, now on ASA 325mg  daily.   Ongoing aggressive stroke risk factor management  Continued need for ICU level care  Therapy recommendations:  pending   Disposition:  Transfer to floor   Paroxysmal Atrial Fibrillation  Documented on tele 07/27/2014  CHADS-VASc is 4 (stroke, HTN, Female).   Decided not an anticoagulation candidate at that time (07/2014) as risk outweighs benefits given multiple medical issues (see note 07/28/2014 from Dr. Anne Fu -"Ultimately deserves anticoagulation (coumadin). Given her multitude of co morbidities (including fistula) she would not be an optimal candidate for anticoagulation. Risks outweigh benefits. Family understands risk of future stroke/ thrombus.")  May reconsider anticoagulation with coumadin if family still aggressive on treatment and pt medical condition improves         Condition Points   C CHF (or Left ventricular systolic dysfunction) 0   H HTN: BP consistently above 140/90 mmHg (or treated HTN on meds)  1   A2 Age ?75 years 0   D DM 0   S2 Prior Stroke or TIA or thromboembolism 2   V Vascular disease (e.g. PAD, MI, aortic plaque) 0   A Age 46-74 years 0   Cuming female sex  1           Annual Stroke Risk CHA2DS2-VASc Score Stroke Risk % 95% CI  0 0  1 1.3  2 2.2  3 3.2  4 4.0  5 6.7  6 9.8  7 9.6  8 12.5  9 15.2     Condition Points   H HTN: uncontrolled, >160 mmHg systolic 1   A2 Abnormal renal function: Dialysis, transplant, Cr >2.6 mg/dL or >161 mol/L Abnormal liver function: Cirrhosis or Bilirubin >2x Normal or AST/ALT/AP >3x Normal 1 0   S  Prior history of stroke  1   B Bleeding: prior major bleeding or predisposition to bleeding 0  L Labile INR: unstable / high INRs, time in therapeutic range < 60% 0  E Elderly:  age > 65 years Medication Usage Predisposing to Bleeding: (Antiplatelet agents, NSAIDs) 0 0  D  Prior alcohol or drug usage history 0    HAS-BLED score n Bleeds, n Bleeds/100 patients  0 798 9 1.13  1 1286 13 1.02  2 744 14 1.88  3 187 7 3.74  4 46 4 8.70  5 8 1  12.50    Previous stroke  Prolonged hospitalization from 06/17/14 to 09/11/14  Right hemisphere acute infarcts involving right ACA, left PCA, and left MCA, cardioembolic pattern, pt w/ PAF  Home meds include ASA 81mg    Respiratory distress  Extubated 11/20/14  Copious secretions  Put on chest PT Q4, NT suction and mucomyst nebs Q6  Has albuterol PRN  Has atrovent and Xopenex Q6  Code status DNR  Will have IM consult once transfer to floor  ? Possible seizure  Intermittent, arrhythmic twitching of R hand  Started on Keppra, 1 gm load followed by 500 mg bid  EEG negative for seizure  AcceleratedHypertension   BP 189/118 on arrival  stable  Not on BP meds   Hyperlipidemia  Home meds:  No statin  LDL 79, goal < 70  On lipitor 10mg  daily  Continue statin on discharge  Diabetes  Hypoglycemia over night w/ dextrose given  HgbA1c 4.8, at goal < 7.0  Controlled  Other Stroke Risk Factors  Cigarette smoker  UDS positive for benzos and THC  Obesity, Body mass index is 29.83 kg/(m^2).   Known PFO  Other Active Problems  ESRD on HD T, TH, S  AAA 3 cm by CT 08/2014  Hypokalemia - on supplement  Other Pertinent History  Recent admission for bowel ischemia and resection Ezme 2016; palliative care heavily involved.  DNR  Hospital day # 12  This patient is still in critical condition due to respiratory distress s/p extubation and nasal trumpet and currently under aggressive respiratory treatment, left MCA  large infarct in the setting of pAfib, ESRD on HD and at significant risk of neurological worsening, death form respiratory failure, recurrent stroke and hemorrhagic transformation, as well as fluid overload and electrolyte imbalance. This patient's care requires frequent monitoring of vital signs, hemodynamics, respiratory and cardiac monitoring, neurological assessment, discussion with family, other specialists and medical decision making of high complexity. I also discussed with daughter and Dr. Kendrick Fries regarding her care plan.   Marvel Plan, MD PhD Stroke Neurology 11/22/2014 9:41 AM   To contact Stroke Continuity provider, please refer to [email or web address]. After hours, contact General Neurology

## 2014-11-22 NOTE — Progress Notes (Signed)
[facility] PCCM  S: Resting comfortably this morning, HD yesterday, received NTS suctioning last night O: Filed Vitals:   11/22/14 0700 11/22/14 0744 11/22/14 0800 11/22/14 0812  BP: 91/56  106/77   Pulse: 98  100   Temp:    98.6 F (37 C)  TempSrc:    Oral  Resp: 21  28   Height:      Weight:      SpO2: 94% 95% 97%    Gen: chronically ill appearing, no distress HENT: nasal trumpet in place PULM: Few basilar crackles but no rhonchi or stridor, normal respiratory effort CV: RRR, no mgr, trace edema GI: BS+, soft, nontender Derm: no cyanosis or rash Neuro: Awake and tracking with eyes, not following commands  CXR personally reviewed: atelecatasis vs consolidation left lung  WBC 5.5 this morning  Impression: 1) S/P large CVA > non-verbal, persistent limitation in functional status, ability to communicate, overall poor chance of any meaningful recovery 2) Atelectasis left lung, does not have pneumonia 3) Dysphaghia> doubt will be able to swallow successfully 4) ESRD  Plan: OK to transfer to floor Continue chest PT Continue mucinex Continue PRN NTS suctioning NPO Tube feedings per PEG  Palliation is appropriate if family decides DNR status  PCCM will sign off, would call [facility] service for assistance with chronic medical issues   Heber [place], MD [facility] Pager: [phone] Cell: [phone] After 3pm or if no response, call [phone]

## 2014-11-23 DIAGNOSIS — I63412 Cerebral infarction due to embolism of left middle cerebral artery: Principal | ICD-10-CM

## 2014-11-23 LAB — GLUCOSE, CAPILLARY
GLUCOSE-CAPILLARY: 86 mg/dL (ref 65–99)
GLUCOSE-CAPILLARY: 107 mg/dL — AB (ref 65–99)
GLUCOSE-CAPILLARY: 86 mg/dL (ref 65–99)
Glucose-Capillary: 91 mg/dL (ref 65–99)
Glucose-Capillary: 84 mg/dL (ref 65–99)

## 2014-11-23 LAB — CBC
HEMATOCRIT: 32.1 % — AB (ref 36.0–46.0)
HEMOGLOBIN: 10.2 g/dL — AB (ref 12.0–15.0)
MCH: 27.7 pg (ref 26.0–34.0)
MCHC: 31.8 g/dL (ref 30.0–36.0)
MCV: 87.2 fL (ref 78.0–100.0)
PLATELETS: 295 10*3/uL (ref 150–400)
RBC: 3.68 MIL/uL — AB (ref 3.87–5.11)
RDW: 18.5 % — ABNORMAL HIGH (ref 11.5–15.5)
WBC: 6.2 10*3/uL (ref 4.0–10.5)

## 2014-11-23 LAB — BASIC METABOLIC PANEL
ANION GAP: 13 (ref 5–15)
BUN: 75 mg/dL — AB (ref 6–20)
CHLORIDE: 98 mmol/L — AB (ref 101–111)
CO2: 26 mmol/L (ref 22–32)
Calcium: 8.5 mg/dL — ABNORMAL LOW (ref 8.9–10.3)
Creatinine, Ser: 5.88 mg/dL — ABNORMAL HIGH (ref 0.44–1.00)
GFR calc Af Amer: 8 mL/min — ABNORMAL LOW (ref >60)
GFR calc non Af Amer: 7 mL/min — ABNORMAL LOW (ref >60)
GLUCOSE: 96 mg/dL (ref 65–99)
POTASSIUM: 3.1 mmol/L — AB (ref 3.5–5.1)
Sodium: 137 mmol/L (ref 135–145)

## 2014-11-23 MED ORDER — IPRATROPIUM-ALBUTEROL 0.5-2.5 (3) MG/3ML IN SOLN
3.0000 mL | RESPIRATORY_TRACT | Status: DC | PRN
  Administered 2014-11-24: 3 mL via RESPIRATORY_TRACT

## 2014-11-23 MED ORDER — IPRATROPIUM-ALBUTEROL 0.5-2.5 (3) MG/3ML IN SOLN
3.0000 mL | Freq: Four times a day (QID) | RESPIRATORY_TRACT | Status: DC
  Administered 2014-11-23 – 2014-11-25 (×8): 3 mL via RESPIRATORY_TRACT
  Filled 2014-11-23 (×9): qty 3

## 2014-11-23 MED ORDER — POTASSIUM CHLORIDE 20 MEQ/15ML (10%) PO SOLN: 30.0000 meq | Freq: Three times a day (TID) | ORAL | Status: DC

## 2014-11-23 MED ORDER — NEPRO/CARBSTEADY PO LIQD
1000.0000 mL | ORAL | Status: DC
  Administered 2014-11-23: 1000 mL via ORAL
  Filled 2014-11-23 (×4): qty 1000

## 2014-11-23 MED ORDER — POTASSIUM CHLORIDE 20 MEQ/15ML (10%) PO SOLN
30.0000 meq | Freq: Three times a day (TID) | ORAL | Status: AC
  Administered 2014-11-23 (×3): 30 meq
  Filled 2014-11-23 (×3): qty 30

## 2014-11-23 NOTE — Progress Notes (Signed)
  [facility] Progress Note   Subjective: follows simple commands nonverbal  Filed Vitals:   11/23/14 0114 11/23/14 0138 11/23/14 0500 11/23/14 0729  BP:  125/84 130/88   Pulse:  89 92   Temp:  98.6 F (37 C) 98.5 F (36.9 C)   TempSrc:  Oral Oral   Resp:  25 23   Height:      Weight:   87.091 kg (192 lb)   SpO2: 94% 94% 94% 93%   Exam: Moves head to voice +JVD Chest bilat basilar rhonchi, R IJ cath RRR 2/6 holosyst M Abd +scars, PEG, +BS LUA AVF  Neuro wiggles R foot/ L foot, grips L hand slightly   TTS AF   4h  91kg  2/2 bath Hep none Hect 2 ug Aranesp 200 /wk + venofer 50/wk3      Assessment: 1. ESRD start using AVF this week 2. HPTH cont binder 3. Anemia darbe 100/wk 4. CVA R hemi 5. Nutrition on TF's 6. Dispo - would not be a candidate for outpt HD in current condition  Plan - HD tomorrow, UF 1-2 kg as Stephanie Abbot MD  pager [phone]    cell [phone]  11/23/2014, 8:07 AM     Recent Labs Lab 11/18/14 0217 11/19/14 0841  11/21/14 0859 11/22/14 0208 11/23/14 0441  NA 140 141  < > 141 138 137  K 3.5 5.0  < > 3.8 3.2* 3.1*  CL 101 102  < > 104 98* 98*  CO2 28 27  < > GLUCOSE 99 93  < > 79 97 96  BUN 62* 99*  < > 102* 47* 75*  CREATININE 5.19* 6.89*  < > 7.32* 4.09* 5.88*  CALCIUM 8.6* 8.8*  < > 8.9 8.5* 8.5*  PHOS 3.9 5.8*  --  6.5*  --   --   < > = values in this interval not displayed.  Recent Labs Lab 11/19/14 0841 11/21/14 0859  ALBUMIN 2.0* 2.2*    Recent Labs Lab 11/21/14 0900 11/22/14 0208 11/23/14 0441  WBC 5.4 5.5 6.2  HGB 10.3* 10.5* 10.2*  HCT 33.1* 33.5* 32.1*  MCV 88.7 88.6 87.2  PLT 267 265 295   . acetylcysteine  3 mL Nebulization Q6H  . antiseptic oral rinse  7 mL Mouth Rinse QID  . aspirin  325 mg Oral Daily  . budesonide  0.25 mg Nebulization BID  . chlorhexidine gluconate  15 mL Mouth Rinse BID  . [START ON 11/24/2014] darbepoetin (ARANESP) injection - DIALYSIS  100 mcg  Intravenous Q Tue-HD  . feeding supplement (PRO-STAT SUGAR FREE 64)  30 mL Per Tube 5 X Daily  . feeding supplement (VITAL HIGH PROTEIN)  1,000 mL Per Tube Q24H  . guaifenesin  200 mg Per Tube TID  . ipratropium-albuterol  3 mL Nebulization Q6H  . lanthanum  500 mg Per Tube TID  . levETIRAcetam  500 mg Per Tube BID  . levothyroxine  25 mcg Oral QAC breakfast  . multivitamin  1 tablet Oral QHS  . pantoprazole sodium  40 mg Per Tube Q1200     acetaminophen **OR** [DISCONTINUED] acetaminophen, atropine, iohexol, ipratropium-albuterol, senna-docusate

## 2014-11-23 NOTE — Progress Notes (Signed)
[facility] PROGRESS NOTE  Latandra C Schuenemann ZOX:[id number] DOB: [birth year] DOA: 11/10/2014 PCP: [name], MD  Assessment/Plan:  Active Problems:   CVA (cerebral infarction)   Acute respiratory failure with hypoxemia   PFO (patent foramen ovale)   Essential hypertension   Paroxysmal a-fib   HLD (hyperlipidemia)   ESRD on dialysis   History of stroke   DNAR (do not attempt resuscitation)   PEG (percutaneous endoscopic gastrostomy) status   Respiratory distress   Cerebral infarction due to embolism of left middle cerebral artery  Stable. Looking into CIR  HPI/Subjective: Family/RN report no new problems  Objective: Filed Vitals:   11/23/14 1341  BP: 117/77  Pulse: 102  Temp: 98 F (36.7 C)  Resp: 20   No intake or output data in the 24 hours ending 11/23/14 1708 Filed Weights   11/21/14 1753 11/22/14 0356 11/23/14 0500  Weight: 96 kg (211 lb 10.3 oz) 86.4 kg (190 lb 7.6 oz) 87.091 kg (192 lb)    Exam:   General:  Alert. Nonverbal. Will track  Cardiovascular: RRR  Respiratory: CTA  Abdomen: S, NT, ND  Ext: no CCE  Basic Metabolic Panel:  Recent Labs Lab 11/17/14 0249 11/18/14 0217 11/19/14 0841 11/20/14 0243 11/21/14 0859 11/22/14 0208 11/23/14 0441  NA 137 140 141 141 141 138 137  K 4.3 3.5 5.0 3.4* 3.8 3.2* 3.1*  CL 100* 101 102 102 104 98* 98*  CO2 21* 28 27 27 22 27 26   GLUCOSE 93 99 93 97 79 97 96  BUN 119* 62* 99* 70* 102* 47* 75*  CREATININE 8.37* 5.19* 6.89* 5.79* 7.32* 4.09* 5.88*  CALCIUM 8.8* 8.6* 8.8* 8.8* 8.9 8.5* 8.5*  MG 2.1 1.9  --   --   --   --   --   PHOS 6.3* 3.9 5.8*  --  6.5*  --   --    Liver Function Tests:  Recent Labs Lab 11/19/14 0841 11/21/14 0859  ALBUMIN 2.0* 2.2*   No results for input(s): LIPASE, AMYLASE in the last 168 hours. No results for input(s): AMMONIA in the last 168 hours. CBC:  Recent Labs Lab 11/18/14 0217 11/20/14 0243 11/21/14 0900 11/22/14 0208 11/23/14 0441  WBC 7.5 6.3 5.4  5.5 6.2  HGB 11.0* 9.5* 10.3* 10.5* 10.2*  HCT 34.4* 30.5* 33.1* 33.5* 32.1*  MCV 89.8 88.7 88.7 88.6 87.2  PLT 262 269 267 265 295   Cardiac Enzymes: No results for input(s): CKTOTAL, CKMB, CKMBINDEX, TROPONINI in the last 168 hours. BNP (last 3 results)  Recent Labs  06/17/14 2043  BNP 1378.8*    ProBNP (last 3 results) No results for input(s): PROBNP in the last 8760 hours.  CBG:  Recent Labs Lab 11/22/14 2359 11/23/14 0355 11/23/14 0747 11/23/14 1109 11/23/14 1631  GLUCAP 76 86 91 107* 86    No results found for this or any previous visit (from the past 240 hour(s)).   Studies: No results found.  Scheduled Meds: . acetylcysteine  3 mL Nebulization Q6H  . antiseptic oral rinse  7 mL Mouth Rinse QID  . aspirin  325 mg Oral Daily  . budesonide  0.25 mg Nebulization BID  . chlorhexidine gluconate  15 mL Mouth Rinse BID  . [START ON 11/24/2014] darbepoetin (ARANESP) injection - DIALYSIS  100 mcg Intravenous Q Tue-HD  . guaifenesin  200 mg Per Tube TID  . ipratropium-albuterol  3 mL Nebulization Q6H  . lanthanum  500 mg Per Tube TID  . levETIRAcetam  500 mg Per Tube BID  . levothyroxine  25 mcg Oral QAC breakfast  . multivitamin  1 tablet Oral QHS  . pantoprazole sodium  40 mg Per Tube Q1200  . potassium chloride  30 mEq Per Tube TID   Continuous Infusions: . feeding supplement (NEPRO CARB STEADY)      Time spent: 15 minutes  Yaira Bernardi L  [facility] [email or web address], password [facility] 11/23/2014, 5:08 PM  LOS: 13 days

## 2014-11-23 NOTE — Progress Notes (Signed)
Rehab admissions - Evaluated for possible admission.  Patient known to rehab from previous inpatient rehab admission.  I will follow up with patient and family to coordinate timing of admission.  Currently rehab beds are full, but I do have beds available tomorrow.  Call me for questions.  #[phone]

## 2014-11-23 NOTE — Consult Note (Signed)
Physical Medicine and Rehabilitation Consult Reason for Consult: Left MCA territory infarct Referring Physician: Dr. Pearlean Brownie   HPI: [name] is a 59 y.o. 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 She was discharged to home with family ambulating short household distances using a rolling walker and minimal assist to contact-guard. Presented 11/10/2014 with right-sided weakness and left gaze deviation. Patient did require intubation and mechanical ventilation for acute respiratory failure and extubated 11/20/2014. MRI of the brain showed moderate size left MCA territory infarct affecting the left insula and operculum. Expected evolution of posterior right ACA and posterior left MCA infarcts seen in March. Patient did receive TPA. Echocardiogram with ejection fraction of 65% no wall motion abnormalities. EEG consistent with toxic metabolic encephalopathy or seizure activity noted. CT angiogram head and neck shows no large vessel proximal occlusion. Neurology consulted aspirin increased to 325 mg daily. Hemodialysis ongoing as per renal services. Patient currently nothing by mouth with PEG tube feeds for nutritional support. Await formal physical and occupational therapy evaluations to be completed. M.D. has requested physical medicine rehabilitation consult.   Review of Systems  Constitutional: Negative for fever and chills.  Eyes: Positive for blurred vision.  Respiratory: Positive for cough.        Shortness of breath with exertion  Cardiovascular: Positive for palpitations and leg swelling.  Gastrointestinal: Positive for nausea and constipation.  Skin: Negative for rash.  Neurological: Positive for dizziness and weakness.  Negative for headaches.       Dysphagia  Psychiatric/Behavioral: Positive for depression. The patient has insomnia.        Anxiety   Past Medical History  Diagnosis Date  . Asthma   . Hypertension   . Gout   . Arthritis   . Insomnia   . Chronic kidney disease   . Depression   . Renal insufficiency   . Aortic aneurysm without rupture     3cm by CT 08/03/14  . Paroxysmal SVT (supraventricular tachycardia)   . Cor pulmonale   . Stroke 06/2014  . PFO (patent foramen ovale)   . COPD (chronic obstructive pulmonary disease)    Past Surgical History  Procedure Laterality Date  . Multiple tooth extractions    . Av fistula placement Left 01/28/2014    Procedure: ARTERIOVENOUS (AV) FISTULA CREATION;  Surgeon: Sherren Kerns, MD;  Location: [facility] OR;  Service: Vascular;  Laterality: Left;  . Tee without cardioversion N/A 06/23/2014    Procedure: TRANSESOPHAGEAL ECHOCARDIOGRAM (TEE);  Surgeon: Lewayne Bunting, MD;  Location: [facility] ENDOSCOPY;  Service: Cardiovascular;  Laterality: N/A;  . Insertion of dialysis catheter Right 06/27/2014    Procedure: INSERTION OF Right Internal Jugular DIATEK CATHETER;  Surgeon: Pryor Ochoa, MD;  Location: [facility] OR;  Service: Vascular;  Laterality: Right;  . Laparotomy N/A 07/01/2014    Procedure: EXPLORATORY LAPAROTOMY WITH CLOSURE OF PERFORATED PYLORIC ULCER;  Surgeon: Claud Kelp, MD;  Location: [facility] OR;  Service: General;  Laterality: N/A;  . Bowel resection N/A 07/01/2014    Procedure: SMALL BOWEL RESECTION;  Surgeon: Claud Kelp, MD;  Location: [facility] OR;  Service: General;  Laterality: N/A;  . Revison of arteriovenous fistula Left 09/09/2014    Procedure: LIGATION OF COMPETING BRANCH, & RESECTION OF VENOUS ANEURYSM OF LEFT UPPER ARM ARTERIOVENOUS FISTULA;  Surgeon: Larina Earthly, MD;  Location: [facility] OR;  Service: Vascular;  Laterality: Left;   Family History  Problem Relation Age of Onset  . Diabetes Mother   . Hypertension Mother   . Heart disease Mother   .  Heart attack Mother   . Peripheral vascular disease Mother   . Diabetes Father   . Heart disease Father   . Hypertension Father   . Diabetes Sister   . Hypertension Sister   . Heart disease Sister     before age 54  . Heart attack Sister   . Peripheral vascular disease Sister   . Heart disease Brother   . Hyperlipidemia Daughter    Social History:  reports that she has been smoking Cigarettes.  She has a 10 pack-year smoking history. She has never used smokeless tobacco. She reports that she drinks alcohol. She reports that she does not use illicit drugs. Allergies:  Allergies  Allergen Reactions  . Heparin Other (See Comments)    Heparin antibody positive; SRA negative   Medications Prior to Admission  Medication Sig Dispense Refill  . ALPRAZolam (XANAX) 0.5 MG tablet Take 1 tablet (0.5 mg total) by mouth Every Tuesday,Thursday,and Saturday with dialysis. 30 tablet 0  . amiodarone (PACERONE) 200 MG tablet 2 tablets daily until 10/05/2014 then 1 tablet daily (Patient taking differently: Take 200 mg by mouth daily. ) 60 tablet 1  . aspirin 81 MG chewable tablet Chew 1 tablet (81 mg total) by mouth daily.    . calcitRIOL (ROCALTROL) 0.5 MCG capsule Take 1 capsule (0.5 mcg total) by mouth every Tuesday, Thursday, and Saturday at 6 PM. 30 capsule 3  . citalopram (CELEXA) 20 MG tablet Take 1 tablet (20 mg total) by mouth daily. 30 tablet 1  . levothyroxine (SYNTHROID, LEVOTHROID) 25 MCG tablet Take 1 tablet (25 mcg total) by mouth daily before breakfast. (Patient taking differently: Take 25 mcg by mouth every other day. ) 30 tablet 1  . midodrine (PROAMATINE) 10 MG tablet Take 1 tablet (10 mg total) by mouth 2 (two) times daily with a meal. (Patient taking differently: Take 10 mg by mouth daily. ) 60 tablet 0  . montelukast (SINGULAIR) 10 MG tablet Take 1 tablet (10 mg total) by mouth at bedtime. 30 tablet 3  . multivitamin (RENA-VIT) TABS tablet Take 1 tablet by mouth at bedtime. 30 tablet  0  . Nutritional Supplements (FEEDING SUPPLEMENT, JEVITY 1.2 CAL,) LIQD Place 1,000 mLs into feeding tube continuous. 1000 mL 0  . oxyCODONE (ROXICODONE) 5 MG immediate release tablet Take 1 tablet (5 mg total) by mouth every 6 (six) hours as needed for severe pain. 60 tablet 0  . sevelamer carbonate (RENVELA) 800 MG tablet Take 800 mg by mouth 3 (three) times daily with meals.    . vitamin C (VITAMIN C) 250 MG tablet Take 1 tablet (250 mg total) by mouth daily. 30 tablet 1  . budesonide (PULMICORT) 0.25 MG/2ML nebulizer solution Take 2 mLs (0.25 mg total) by nebulization 2 (two) times daily. (Patient not taking: Reported on 11/11/2014) 60 mL 12  . collagenase (SANTYL) ointment Apply topically daily. As directed 15 g 0  . ipratropium (ATROVENT) 0.02 % nebulizer solution Take 2.5 mLs (0.5 mg total) by nebulization 3 (three) times daily. (Patient not taking: Reported on 11/11/2014) 75 mL 12  . levalbuterol (XOPENEX) 0.63 MG/3ML nebulizer solution Take 3 mLs (0.63 mg total) by nebulization 3 (three) times daily. (Patient not taking: Reported on 11/11/2014) 3 mL  12    Home: Home Living Family/patient expects to be discharged to:: Unsure Additional Comments: No family available to ask home living questions. Unsure of support post discharge.   Functional History: Prior Function Comments: According to chart 2 months ago, family was assisting with meals and ensuring pt take her proper medication & bill managment. Functional Status:  Mobility: Bed Mobility Overal bed mobility: Needs Assistance Bed Mobility: Supine to Sit, Sit to Supine Supine to sit: +2 for physical assistance, HOB elevated, Max assist Sit to supine: Max assist, +2 for physical assistance General bed mobility comments: Total A of 2 to get to EOB with assist bringing BLEs to EOB, scooting bottom and elevating trunk. Transfers Overall transfer level:  (Deferred secondary to inability to follow commands and weakness/fatigue.)       ADL: ADL Overall ADL's : Needs assistance/impaired General ADL Comments: Pt overall total +2 for bed mobility and ADLs due to decreased cognition, decreased functional etremeity movement, decreased trunk control/support  Cognition: Cognition Overall Cognitive Status: Impaired/Different from baseline (difficult to fully assess secondary to aphasia) Arousal/Alertness: Awake/alert Orientation Level:  (unable to assess due to language deficits) Attention: Focused Focused Attention: Impaired Focused Attention Impairment: Verbal basic, Functional basic Memory:  (unable to assess, suspect impaired) Awareness: Impaired Awareness Impairment: Intellectual impairment Problem Solving: Impaired Problem Solving Impairment: Verbal basic, Functional basic Cognition Arousal/Alertness: Awake/alert Behavior During Therapy: Flat affect Overall Cognitive Status: Impaired/Different from baseline (difficult to fully assess secondary to aphasia) Area of Impairment: Attention, Following commands, Awareness Current Attention Level: Sustained Following Commands: Follows one step commands inconsistently Awareness: Intellectual General Comments: Pt able to attempt a thumbs up at end of session, using right hand after being asked. Pt very inconsistent with following commands, pt did not touch her nose or stick out her tongue with instructional/verbal, tactile, or demonstrational cues.  Difficult to assess due to: Impaired communication (At end of session, therapist asked pt to give her a thumbs up and pt initiated movement of RUE. Otherwise not following simple 1 step commands despite visual/tactile cues. Able to gaze right with auditory cueing (hitting tray table). )  Blood pressure 117/77, pulse 102, temperature 98 F (36.7 C), temperature source Oral, resp. rate 20, height 5' 7.01" (1.702 m), weight 87.091 kg (192 lb), SpO2 94 %. Physical Exam  Eyes:  Pupils reactive to light  Neck: Normal range of  motion. Neck supple. No thyromegaly present.  Cardiovascular: Normal rate and regular rhythm.   Respiratory:  Decreased breath sounds at the bases with scattered rhonchi  GI: Soft. Bowel sounds are normal.  PEG tube in place  Neurological: She is alert.  Left gaze deviation. She did not follow motor or verbal commands. She was nonverbal throughout exam  Skin: Skin is warm and dry.    Results for orders placed or performed during the hospital encounter of 11/10/14 (from the past 24 hour(s))  Glucose, capillary     Status: None   Collection Time: 11/22/14  4:44 PM  Result Value Ref Range   Glucose-Capillary 75 65 - 99 mg/dL  Glucose, capillary     Status: None   Collection Time: 11/22/14  8:29 PM  Result Value Ref Range   Glucose-Capillary 73 65 - 99 mg/dL   Comment 1 Notify RN    Comment 2 Document in Chart   Glucose, capillary     Status: None   Collection Time: 11/22/14 11:59 PM  Result Value Ref Range   Glucose-Capillary 76 65 - 99 mg/dL  Comment 1 Notify RN    Comment 2 Document in Chart   Glucose, capillary     Status: None   Collection Time: 11/23/14  3:55 AM  Result Value Ref Range   Glucose-Capillary 86 65 - 99 mg/dL   Comment 1 Notify RN    Comment 2 Document in Chart   CBC     Status: Abnormal   Collection Time: 11/23/14  4:41 AM  Result Value Ref Range   WBC 6.2 4.0 - 10.5 K/uL   RBC 3.68 (L) 3.87 - 5.11 MIL/uL   Hemoglobin 10.2 (L) 12.0 - 15.0 g/dL   HCT 03.4 (L) 74.2 - 59.5 %   MCV 87.2 78.0 - 100.0 fL   MCH 27.7 26.0 - 34.0 pg   MCHC 31.8 30.0 - 36.0 g/dL   RDW 63.8 (H) 75.6 - 43.3 %   Platelets 295 150 - 400 K/uL  Basic metabolic panel     Status: Abnormal   Collection Time: 11/23/14  4:41 AM  Result Value Ref Range   Sodium 137 135 - 145 mmol/L   Potassium 3.1 (L) 3.5 - 5.1 mmol/L   Chloride 98 (L) 101 - 111 mmol/L   CO2 26 22 - 32 mmol/L   Glucose, Bld 96 65 - 99 mg/dL   BUN 75 (H) 6 - 20 mg/dL   Creatinine, Ser 2.95 (H) 0.44 - 1.00 mg/dL    Calcium 8.5 (L) 8.9 - 10.3 mg/dL   GFR calc non Af Amer 7 (L) >60 mL/min   GFR calc Af Amer 8 (L) >60 mL/min   Anion gap 13 5 - 15  Glucose, capillary     Status: None   Collection Time: 11/23/14  7:47 AM  Result Value Ref Range   Glucose-Capillary 91 65 - 99 mg/dL   Comment 1 Notify RN   Glucose, capillary     Status: Abnormal   Collection Time: 11/23/14 11:09 AM  Result Value Ref Range   Glucose-Capillary 107 (H) 65 - 99 mg/dL   No results found.  Assessment/Plan: Diagnosis: left MCA infarct, respiratory faliure 1. Does the need for close, 24 hr/day medical supervision in concert with the patient's rehab needs make it unreasonable for this patient to be served in a less intensive setting? Yes 2. Co-Morbidities requiring supervision/potential complications: htn, afib 3. Due to bladder management, bowel management, safety, skin/wound care, disease management, medication administration and patient education, does the patient require 24 hr/day rehab nursing? Yes 4. Does the patient require coordinated care of a physician, rehab nurse, PT (1-2 hrs/day, 5 days/week), OT (1-2 hrs/day, 5 days/week) and SLP (1-2 hrs/day, 5 days/week) to address physical and functional deficits in the context of the above medical diagnosis(es)? Yes Addressing deficits in the following areas: balance, endurance, locomotion, strength, transferring, bowel/bladder control, bathing, dressing, feeding, grooming, toileting, cognition, speech, language, swallowing and psychosocial support 5. Can the patient actively participate in an intensive therapy program of at least 3 hrs of therapy per day at least 5 days per week? Yes 6. The potential for patient to make measurable gains while on inpatient rehab is excellent 7. Anticipated functional outcomes upon discharge from inpatient rehab are min assist  with PT, min assist and mod assist with OT, min assist with SLP. 8. Estimated rehab length of stay to reach the above  functional goals is: 20-25 days 9. Does the patient have adequate social supports and living environment to accommodate these discharge functional goals? Potentially 10. Anticipated D/C setting: Home 11.  Anticipated post D/C treatments: HH therapy and Outpatient therapy 12. Overall Rehab/Functional Prognosis: excellent  RECOMMENDATIONS: This patient's condition is appropriate for continued rehabilitative care in the following setting: CIR Patient has agreed to participate in recommended program. Yes Note that insurance prior authorization may be required for reimbursement for recommended care.  Comment: Family will likely need to provide more assistance at home than they did after last rehab admission. Rehab Admissions Coordinator to follow up.  Thanks,  Ranelle Oyster, MD, [place] [facility]     11/23/2014

## 2014-11-23 NOTE — Progress Notes (Signed)
[facility]  Patient Status: Active (receiving services up to time of hospitalization)  [facility] is providing the following services: RN, PT and OT  If patient discharges after hours, please call [phone].   [name] 11/23/2014, 11:24 AM

## 2014-11-23 NOTE — Evaluation (Signed)
Speech Language Pathology Evaluation Patient Details Name: [name] MRN: [id number] DOB: [birth year] Today's Date: 11/23/2014 Time: 8101-7510 SLP Time Calculation (min) (ACUTE ONLY): 13 min  Problem List:  Patient Active Problem List   Diagnosis Date Noted  . Cerebral infarction due to embolism of left middle cerebral artery   . Gastrostomy tube in place   . PEG (percutaneous endoscopic gastrostomy) status   . Respiratory distress   . DNAR (do not attempt resuscitation) 11/20/2014  . Paroxysmal a-fib 11/16/2014  . HLD (hyperlipidemia) 11/16/2014  . ESRD on dialysis 11/16/2014  . History of stroke 11/16/2014  . Essential hypertension   . PFO (patent foramen ovale)   . Acute respiratory failure with hypoxemia   . CVA (cerebral infarction) 11/10/2014  . Chronic ischemic colitis 09/25/2014  . Acute right ACA stroke 09/11/2014  . Left hemiparesis 09/11/2014  . History of ETT   . Duodenal fistula   . Sepsis   . Aortic aneurysm 08/13/2014  . Protein-calorie malnutrition   . Itching 08/12/2014  . Duodenal anastomotic leak   . Protein calorie malnutrition   . SVT (supraventricular tachycardia)   . Right heart failure   . ESRD (end stage renal disease)   . Abdominal pain   . Palliative care encounter   . 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 Fistula   . History of intermediate V/Q scan   . Other emphysema   . Intra-abdominal abscess   . Abnormal TSH   . Enteritis due to Clostridium difficile 07/19/2014  . Abdominal abscess   . Pelvic fluid collection   . Perforated gastric ulcer 07/01/2014  . SOB (shortness of breath)   . PJRT (permanent junctional reciprocating tachycardia)   . Paroxysmal atrial tachycardia   . PSVT (paroxysmal supraventricular tachycardia)   . Stroke   . Hyperlipidemia   . 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 Hypertension 06/20/2014  . ESRD needing dialysis 06/20/2014  . Obesity (BMI 30-39.9) 06/20/2014  .  Depression 06/20/2014  . Left renal mass 06/20/2014  . Chronic diastolic heart failure 06/20/2014  . H1N1 influenza 06/20/2014  . Tobacco use disorder 06/20/2014  . ARF (acute renal failure)   . Renal failure (ARF), acute on chronic   . Acute respiratory failure with hypoxia 06/17/2014  . End stage renal disease 01/21/2014   Past Medical History:  Past Medical History  Diagnosis Date  . Asthma   . Hypertension   . Gout   . Arthritis   . Insomnia   . Chronic kidney disease   . Depression   . Renal insufficiency   . Aortic aneurysm without rupture     3cm by CT 08/03/14  . Paroxysmal SVT (supraventricular tachycardia)   . Cor pulmonale   . Stroke 06/2014  . PFO (patent foramen ovale)   . COPD (chronic obstructive pulmonary disease)    Past Surgical History:  Past Surgical History  Procedure Laterality Date  . Multiple tooth extractions    . Av fistula placement Left 01/28/2014    Procedure: ARTERIOVENOUS (AV) FISTULA CREATION;  Surgeon: Sherren Kerns, MD;  Location: [facility] [place] OR;  Service: Vascular;  Laterality: Left;  . Tee without cardioversion N/A 06/23/2014    Procedure: TRANSESOPHAGEAL ECHOCARDIOGRAM (TEE);  Surgeon: Lewayne Bunting, MD;  Location: [facility] ENDOSCOPY;  Service: Cardiovascular;  Laterality: N/A;  . Insertion of dialysis catheter Right 06/27/2014    Procedure: INSERTION OF Right Internal Jugular DIATEK CATHETER;  Surgeon: Pryor Ochoa, MD;  Location: [facility] OR;  Service: Vascular;  Laterality: Right;  . Laparotomy N/A 07/01/2014    Procedure: EXPLORATORY LAPAROTOMY WITH CLOSURE OF PERFORATED PYLORIC ULCER;  Surgeon: Claud Kelp, MD;  Location: [facility] OR;  Service: General;  Laterality: N/A;  . Bowel resection N/A 07/01/2014    Procedure: SMALL BOWEL RESECTION;  Surgeon: Claud Kelp, MD;  Location: [facility] OR;  Service: General;  Laterality: N/A;  . Revison of arteriovenous fistula Left 09/09/2014    Procedure: LIGATION OF COMPETING BRANCH, & RESECTION OF VENOUS ANEURYSM OF LEFT  UPPER ARM ARTERIOVENOUS FISTULA;  Surgeon: Larina Earthly, MD;  Location: [facility] OR;  Service: Vascular;  Laterality: Left;   HPI:  59 year old female admitted with sudden onset right sided weakness and left gaze deviation. Upon arrival to ED, labored respirations requiring intubation 8/9. Dx with moderate size left MCA CVA. IV tpa administered. Extubated 8/19. PMH of prolonged hospitalization 06/17/14-09/11/14 for right hemisphere acute infarcts involving right ACA, left PCA, and left MCA, asthma, gout, depression, COPD.    Assessment / Plan / Recommendation Clinical Impression  Cognitive-linguistic evaluation complete. Patient presents with global aphasia in addition to cognitive deficits impacting awareness, focused attention (with suspected right sided inattention), and problem solving abilitites. Daughter present and educated regarding deficits and plan of care. Patient will benefit from acute SLP f/u to address above deficits, maximizing communication of basic needs/wants. Recommend CIR consult.     SLP Assessment  Patient needs continued Speech Lanaguage Pathology Services    Follow Up Recommendations  Inpatient Rehab    Frequency and Duration min 3x week  2 weeks   Pertinent Vitals/Pain Pain Assessment: Faces Faces Pain Scale: No hurt   SLP Goals  Potential to Achieve Goals (ACUTE ONLY): Good Potential Considerations (ACUTE ONLY): Severity of impairments  SLP Evaluation Prior Functioning  Cognitive/Linguistic Baseline: Within functional limits   Cognition  Overall Cognitive Status: Impaired/Different from baseline Arousal/Alertness: Awake/alert Orientation Level:  (unable to assess due to language deficits) Attention: Focused Focused Attention: Impaired Focused Attention Impairment: Verbal basic;Functional basic Memory:  (unable to assess, suspect impaired) Awareness: Impaired Awareness Impairment: Intellectual impairment Problem Solving: Impaired Problem Solving Impairment:  Verbal basic;Functional basic    Comprehension  Auditory Comprehension Overall Auditory Comprehension: Impaired Yes/No Questions: Impaired Basic Biographical Questions: 0-25% accurate Commands: Impaired One Step Basic Commands: 0-24% accurate Interfering Components: Attention EffectiveTechniques:  (contextual cues) Visual Recognition/Discrimination Discrimination: Exceptions to [facility] Common Objects: Unable to indentify Reading Comprehension Reading Status: Not tested    Expression Expression Primary Mode of Expression: Verbal Verbal Expression Overall Verbal Expression: Impaired Initiation: Impaired Repetition: Impaired Level of Impairment: Word level Naming: Impairment Responsive: 0-25% accurate Confrontation: Impaired Common Objects: Unable to indentify Convergent: 0-24% accurate Divergent: 0-24% accurate Verbal Errors: Other (comment) (non-verbal) Pragmatics: Impairment Impairments: Eye contact Interfering Components: Attention Non-Verbal Means of Communication:  (one time head nod yes in response to basic question) Written Expression Dominant Hand: Right Written Expression: Not tested   Oral / Motor Oral Motor/Sensory Function Overall Oral Motor/Sensory Function: Impaired (unable to fully assess due to mentation/language) Labial ROM: Reduced right Labial Symmetry: Abnormal symmetry right Facial Symmetry: Right droop Motor Speech Overall Motor Speech:  (TBD)   GO    Ferdinand Lango MA, CCC-SLP [phone]  Jahred Tatar Meryl 11/23/2014, 10:07 AM

## 2014-11-23 NOTE — Evaluation (Signed)
Clinical/Bedside Swallow Evaluation Patient Details  Name: [name] MRN: [id number] Date of Birth: 1955/05/13  Today's Date: 11/23/2014 Time: SLP Start Time (ACUTE ONLY): 4098 SLP Stop Time (ACUTE ONLY): 0940 SLP Time Calculation (min) (ACUTE ONLY): 19 min  Past Medical History:  Past Medical History  Diagnosis Date  . Asthma   . Hypertension   . Gout   . Arthritis   . Insomnia   . Chronic kidney disease   . Depression   . Renal insufficiency   . Aortic aneurysm without rupture     3cm by CT 08/03/14  . Paroxysmal SVT (supraventricular tachycardia)   . Cor pulmonale   . Stroke 06/2014  . PFO (patent foramen ovale)   . COPD (chronic obstructive pulmonary disease)    Past Surgical History:  Past Surgical History  Procedure Laterality Date  . Multiple tooth extractions    . Av fistula placement Left 01/28/2014    Procedure: ARTERIOVENOUS (AV) FISTULA CREATION;  Surgeon: Sherren Kerns, MD;  Location: [facility] OR;  Service: Vascular;  Laterality: Left;  . Tee without cardioversion N/A 06/23/2014    Procedure: TRANSESOPHAGEAL ECHOCARDIOGRAM (TEE);  Surgeon: Lewayne Bunting, MD;  Location: [facility] ENDOSCOPY;  Service: Cardiovascular;  Laterality: N/A;  . Insertion of dialysis catheter Right 06/27/2014    Procedure: INSERTION OF Right Internal Jugular DIATEK CATHETER;  Surgeon: Pryor Ochoa, MD;  Location: [facility] OR;  Service: Vascular;  Laterality: Right;  . Laparotomy N/A 07/01/2014    Procedure: EXPLORATORY LAPAROTOMY WITH CLOSURE OF PERFORATED PYLORIC ULCER;  Surgeon: Claud Kelp, MD;  Location: [facility] OR;  Service: General;  Laterality: N/A;  . Bowel resection N/A 07/01/2014    Procedure: SMALL BOWEL RESECTION;  Surgeon: Claud Kelp, MD;  Location: [facility] OR;  Service: General;  Laterality: N/A;  . Revison of arteriovenous fistula Left 09/09/2014    Procedure: LIGATION OF COMPETING BRANCH, & RESECTION OF VENOUS ANEURYSM OF LEFT UPPER ARM ARTERIOVENOUS FISTULA;  Surgeon: Larina Earthly, MD;   Location: [facility] OR;  Service: Vascular;  Laterality: Left;   HPI:  59 year old female admitted with sudden onset right sided weakness and left gaze deviation. Upon arrival to ED, labored respirations requiring intubation 8/9. Dx with moderate size left MCA CVA. IV tpa administered. Extubated 8/19. PMH of prolonged hospitalization 06/17/14-09/11/14 for right hemisphere acute infarcts involving right ACA, left PCA, and left MCA, asthma, gout, depression, COPD.    Assessment / Plan / Recommendation Clinical Impression  Swallow evaluation complete. Po trials limited by patient's cognitive function at this time. Ice chips trials provided with moderate cueing for awareness of bolus and oral acceptance. Once orally accepted, minimal lingual manipulation noted with eventual oral holding requiring sunctioning of oral cavity. Swallow inefficient for ability to resume a po diet today. SLP will f/u at bedside 8/23 for diagnostic po trials.     Aspiration Risk  Severe    Diet Recommendation NPO   Medication Administration: Via alternative means    Other  Recommendations Oral Care Recommendations: Oral care QID      Frequency and Duration min 3x week  2 weeks   Pertinent Vitals/Pain n/a        Swallow Study    General Other Pertinent Information: 59 year old female admitted with sudden onset right sided weakness and left gaze deviation. Upon arrival to ED, labored respirations requiring intubation 8/9. Dx with moderate size left MCA CVA. IV tpa administered. Extubated 8/19. PMH of prolonged hospitalization 06/17/14-09/11/14 for right hemisphere  acute infarcts involving right ACA, left PCA, and left MCA, asthma, gout, depression, COPD.  Type of Study: Bedside swallow evaluation Previous Swallow Assessment: bedside swallow evaluation from previous admission recommended dysphagia 3 solids with thin liquids Diet Prior to this Study: NPO Temperature Spikes Noted: No Respiratory Status: Supplemental O2  delivered via (comment) (nasal cannula) History of Recent Intubation: Yes Length of Intubations (days): 10 days Date extubated: 11/20/14 Behavior/Cognition: Alert;Requires cueing;Doesn't follow directions;Distractible Oral Cavity - Dentition: Missing dentition;Poor condition Self-Feeding Abilities: Total assist Patient Positioning: Upright in bed Baseline Vocal Quality:  (non-verbal/vocal) Volitional Cough: Cognitively unable to elicit Volitional Swallow: Unable to elicit    Oral/Motor/Sensory Function Overall Oral Motor/Sensory Function: Impaired (unable to fully assess due to mentation/language) Labial ROM: Reduced right Labial Symmetry: Abnormal symmetry right Facial Symmetry: Right droop   Ice Chips Ice chips: Impaired Presentation: Spoon Oral Phase Impairments: Reduced lingual movement/coordination;Poor awareness of bolus Oral Phase Functional Implications: Oral holding   Thin Liquid Thin Liquid: Not tested    Nectar Thick Nectar Thick Liquid: Not tested   Honey Thick Honey Thick Liquid: Not tested   Puree Puree: Not tested   Solid   GO   Stephanie Streight MA, CCC-SLP [phone]  Solid: Not tested       [name] [name] 11/23/2014,9:59 AM

## 2014-11-23 NOTE — Evaluation (Signed)
Occupational Therapy Evaluation Patient Details Name: [name] MRN: [id number] DOB: 02/17/1956 Today's Date: 11/23/2014    History of Present Illness Patient is a 59 y/o female who was found to have right sided weakness at dialysis. Was admitted with acute left MCA stroke 8/9 and received IV TPA.Pt was intubated for airway protection in the ER. PMH includes HTN, CKD on HD, depression, aortic aneurysm without rupture, PFO, COPD, CVA.     Clinical Impression   Patient presenting with decreased ADL, IADL, functional mobility independence. Unsure of patient's level of independence PTA. Patient currently requires total assist +2 (bed level) for BADLs, no transfer performed during evaluation. Patient will benefit from acute OT to increase overall independence in the areas of ADLs, functional mobility, and overall safety in order to safely discharge to venue listed below.     Follow Up Recommendations  CIR;Supervision/Assistance - 24 hour    Equipment Recommendations  Other (comment) (TBD)    Recommendations for Other Services Rehab consult     Precautions / Restrictions Precautions Precautions: Fall Precaution Comments: secretions; PEG tube Restrictions Weight Bearing Restrictions: No    Mobility Bed Mobility Overal bed mobility: Needs Assistance Bed Mobility: Supine to Sit;Sit to Supine     Supine to sit: +2 for physical assistance;HOB elevated;Max assist Sit to supine: Max assist;+2 for physical assistance   General bed mobility comments: Total A of 2 to get to EOB with assist bringing BLEs to EOB, scooting bottom and elevating trunk.  Transfers Overall transfer level:  (Deferred secondary to inability to follow commands and weakness/fatigue.)   Balance Overall balance assessment: Needs assistance Sitting-balance support: Single extremity supported;Feet supported Sitting balance-Leahy Scale: Fair Sitting balance - Comments: Able to sit EOB ~10 minutes with right  lateral lean. Able to initiate upright and lean left intermittently resting on right forearm. Min A at times for balance but able to sit unsupported for short periods. Fatigues. Assist to wash face sitting EOB.  Postural control: Right lateral lean    ADL Overall ADL's : Needs assistance/impaired General ADL Comments: Pt overall total +2 for bed mobility and ADLs due to decreased cognition, decreased functional extremity movement, decreased trunk control/support     Vision Vision Assessment?: Vision impaired- to be further tested in functional context;Yes Eye Alignment: Within Functional Limits Ocular Range of Motion: Impaired-to be further tested in functional context Alignment/Gaze Preference: Gaze left Tracking/Visual Pursuits: Impaired - to be further tested in functional context (difficult to assess secondary to pt with difficulty following commands)   Perception Perception Comments: difficult to assess secondary to pt with difficulty following commands   Praxis Praxis Praxis tested?: Deficits Praxis-Other Comments: difficult to assess secondary to pt with difficulty following commands    Pertinent Vitals/Pain Pain Assessment: Faces Faces Pain Scale: No hurt     Hand Dominance Right   Extremity/Trunk Assessment Upper Extremity Assessment Upper Extremity Assessment: RUE deficits/detail RUE Deficits / Details: flaccid RUE, pt unable to follow commands to move UE. However, pt would occassionally, functionally move RUE.  RUE Sensation:  (unsure) RUE Coordination: decreased fine motor;decreased gross motor   Lower Extremity Assessment Lower Extremity Assessment: Defer to PT evaluation RLE Deficits / Details: 2 beats of clonus at ankle. Stiffness throughout RLE. No voluntary active movement noted.  LLE Deficits / Details: Increased stiffness throughout LLE. Able to move minimially to get to EOB with max cues.        Communication Communication Communication: Other (comment)  (expressive and receptive difficulties)   Cognition  Arousal/Alertness: Awake/alert Behavior During Therapy: Flat affect Overall Cognitive Status: Impaired/Different from baseline (difficult to fully assess secondary to aphasia) Area of Impairment: Attention;Following commands;Awareness   Current Attention Level: Sustained   Following Commands: Follows one step commands inconsistently   Awareness: Intellectual   General Comments: Pt able to attempt a thumbs up at end of session, using right hand after being asked. Pt very inconsistent with following commands, pt did not touch her nose or stick out her tongue with instructional/verbal, tactile, or demonstrational cues.               Home Living Family/patient expects to be discharged to:: Unsure   Additional Comments: No family available to ask home living questions. Unsure of support post discharge.       Prior Functioning/Environment Comments: According to chart 2 months ago, family was assisting with meals and ensuring pt take her proper medication & bill managment.    OT Diagnosis: Generalized weakness;Hemiplegia dominant side;Disturbance of vision   OT Problem List: Decreased strength;Decreased range of motion;Decreased activity tolerance;Impaired balance (sitting and/or standing);Impaired vision/perception;Decreased coordination;Decreased cognition;Decreased safety awareness;Decreased knowledge of use of DME or AE;Decreased knowledge of precautions   OT Treatment/Interventions: Self-care/ADL training;Energy conservation;Therapeutic exercise;DME and/or AE instruction;Splinting;Therapeutic activities;Patient/family education;Visual/perceptual remediation/compensation;Balance training    OT Goals(Current goals can be found in the care plan section) Acute Rehab OT Goals Patient Stated Goal: none stated as pt unable OT Goal Formulation: Patient unable to participate in goal setting Time For Goal Achievement: 12/07/14 Potential  to Achieve Goals: Good ADL Goals Pt Will Perform Grooming: bed level;with mod assist Pt Will Perform Upper Body Bathing: bed level;with max assist Pt Will Perform Lower Body Bathing: with max assist;sit to/from stand Pt Will Perform Upper Body Dressing: with max assist;bed level Pt Will Perform Lower Body Dressing: with max assist;sit to/from stand Pt Will Transfer to Toilet: with mod assist;with +2 assist;bedside commode;stand pivot transfer Pt/caregiver will Perform Home Exercise Program: Increased ROM;Increased strength;Right Upper extremity;With written HEP provided;With minimal assist  OT Frequency: Min 3X/week   Barriers to D/C: unsure       Co-evaluation PT/OT/SLP Co-Evaluation/Treatment: Yes Reason for Co-Treatment: Complexity of the patient's impairments (multi-system involvement);For patient/therapist safety PT goals addressed during session: Mobility/safety with mobility;Strengthening/ROM OT goals addressed during session: ADL's and self-care;Strengthening/ROM      End of Session Equipment Utilized During Treatment: Oxygen Nurse Communication: Mobility status;Other (comment) (need for peri cleansing)  Activity Tolerance: Patient tolerated treatment well Patient left: in bed;with call bell/phone within reach;with bed alarm set;with family/visitor present   Time: 1610-9604 OT Time Calculation (min): 34 min Charges:  OT General Charges $OT Visit: 1 Procedure OT Evaluation $Initial OT Evaluation Tier I: 1 Procedure  Malik Ruffino , MS, OTR/L, CLT Pager: [phone]  11/23/2014, 2:16 PM

## 2014-11-23 NOTE — Evaluation (Signed)
Physical Therapy Evaluation Patient Details Name: [name] MRN: [id number] DOB: 08/12/1955 Today's Date: 11/23/2014   History of Present Illness  Patient is a 59 y/o female who was found to have right sided weakness at dialysis. Was admitted with acute left MCA stroke 8/9 and received IV TPA.Pt was intubated for airway protection in the ER. PMH includes HTN, CKD on HD, depression, aortic aneurysm without rupture, PFO, COPD, CVA.      Clinical Impression  Patient presents with functional limitations due to deficits listed in Pt problem list (see below). Pt with global aphasia, right inattention, weakness/stiffness BLEs, RLE>LLE and impaired balance impacting functional mobility. Pt with difficulty following commands despite max multimodal cues. Not sure of pt's PLOF/history. Would benefit from CIR to maximize independence and mobility and improve quality of life prior to return home.    Follow Up Recommendations CIR    Equipment Recommendations  Other (comment) (TBD.)    Recommendations for Other Services Rehab consult     Precautions / Restrictions Precautions Precautions: Fall Precaution Comments: secretions; PEG tube Restrictions Weight Bearing Restrictions: No      Mobility  Bed Mobility Overal bed mobility: Needs Assistance Bed Mobility: Supine to Sit     Supine to sit: +2 for physical assistance;HOB elevated;Max assist     General bed mobility comments: Total A of 2 to get to EOB with assist bringing BLEs to EOB, scooting bottom and elevating trunk.  Transfers Overall transfer level:  (Deferred secondary to inability to follow commands and weakness/fatigue.)                  Ambulation/Gait                Stairs            Wheelchair Mobility    Modified Rankin (Stroke Patients Only) Modified Rankin (Stroke Patients Only) Pre-Morbid Rankin Score: Moderate disability Modified Rankin: Severe disability     Balance Overall balance  assessment: Needs assistance Sitting-balance support: Feet supported;Single extremity supported Sitting balance-Leahy Scale: Fair Sitting balance - Comments: Able to sit EOB ~10 minutes with right lateral lean. Able to initiate upright and lean left intermittently resting on right forearm. Min A at times for balance but able to sit unsupported for short periods. Fatigues. Assist to wash face sitting EOB.  Postural control: Right lateral lean                                   Pertinent Vitals/Pain Pain Assessment: Faces Faces Pain Scale: No hurt    Home Living Family/patient expects to be discharged to:: Unsure                 Additional Comments: No family available to ask home living questions. Unsure of support post discharge.     Prior Function           Comments: According to chart 2 months ago, family was assisting with meals and ensuring pt take her proper medication & bill managment.     Hand Dominance   Dominant Hand: Right    Extremity/Trunk Assessment   Upper Extremity Assessment: Defer to OT evaluation           Lower Extremity Assessment: Generalized weakness;RLE deficits/detail;LLE deficits/detail;Difficult to assess due to impaired cognition RLE Deficits / Details: 2 beats of clonus at ankle. Stiffness throughout RLE. No voluntary active movement noted.  LLE Deficits /  Details: Increased stiffness throughout LLE. Able to move minimially to get to EOB with max cues.      Communication   Communication: Other (comment) (expressive and receptive difficulties)  Cognition Arousal/Alertness: Awake/alert Behavior During Therapy: Flat affect Overall Cognitive Status: Impaired/Different from baseline (difficult to fully assess secondary to aphasia) Area of Impairment: Attention;Following commands;Awareness   Current Attention Level: Sustained   Following Commands: Follows one step commands inconsistently   Awareness: Intellectual    General Comments: Pt able to attempt a thumbs up at end of session, using right hand after being asked. Pt very inconsistent with following commands, pt did not touch her nose or stick out her tongue with instructional/verbal, tactile, or demonstrational cues.     General Comments      Exercises        Assessment/Plan    PT Assessment Patient needs continued PT services  PT Diagnosis Generalized weakness   PT Problem List Decreased strength;Decreased mobility;Decreased balance;Decreased activity tolerance;Decreased range of motion;Decreased cognition;Cardiopulmonary status limiting activity  PT Treatment Interventions Balance training;Neuromuscular re-education;Functional mobility training;Therapeutic exercise;Therapeutic activities;Wheelchair mobility training;Patient/family education;DME instruction;Gait training   PT Goals (Current goals can be found in the Care Plan section) Acute Rehab PT Goals Patient Stated Goal: none stated as pt unable PT Goal Formulation: Patient unable to participate in goal setting Time For Goal Achievement: 12/07/14 Potential to Achieve Goals: Fair    Frequency Min 3X/week   Barriers to discharge        Co-evaluation PT/OT/SLP Co-Evaluation/Treatment: Yes Reason for Co-Treatment: Complexity of the patient's impairments (multi-system involvement);For patient/therapist safety PT goals addressed during session: Mobility/safety with mobility;Strengthening/ROM OT goals addressed during session: ADL's and self-care;Strengthening/ROM       End of Session Equipment Utilized During Treatment: Oxygen Activity Tolerance: Patient limited by fatigue;Patient tolerated treatment well Patient left: in bed;with call bell/phone within reach;with bed alarm set;with family/visitor present;with SCD's reapplied Nurse Communication: Mobility status;Need for lift equipment         Time: 1228-1300 PT Time Calculation (min) (ACUTE ONLY): 32 min   Charges:   PT  Evaluation $Initial PT Evaluation Tier I: 1 Procedure     PT G Codes:        Becka Lagasse A Yekaterina Escutia 11/23/2014, 1:52 PM  Mylo Red, PT, DPT [phone]

## 2014-11-23 NOTE — Progress Notes (Signed)
Nutrition Follow-up  DOCUMENTATION CODES:   Obesity unspecified  INTERVENTION:  Initiate Nepro Carb Steady @ 30 ml/hr via J-tube and increase by 10 ml every 4 hours to goal rate of 50 ml/hr.   Tube feeding regimen provides 2160 kcal (100% of needs), 97 grams of protein, and 876 ml of H2O.    NUTRITION DIAGNOSIS:   Inadequate oral intake related to inability to eat as evidenced by NPO status.  Ongoing  GOAL:   Patient will meet greater than or equal to 90% of their needs  Unmet  MONITOR:   TF tolerance, Weight trends, Labs, Skin, I & O's, Diet advancement  REASON FOR ASSESSMENT:   Consult, Malnutrition Screening Tool, Ventilator Enteral/tube feeding initiation and management  ASSESSMENT:   This is a 59 y/o female who had an acute left MCA stroke today and received IV TPA. She was intubated for airway protection in the ER. She has a long history of ESRD, a recent R ACA stroke, and a recent history of ischemic bowel requiring resection.  Pt was extubated on 8/19. SLP re-evaluated pt today for swallowing and continues to recommend NPO. Pt remains on the same TF regimen from when she was on vent support; Vital High protein is infusing via J-tube at 30 ml/hr with 30 ml Pro-stat 5 times daily- this provides 1220 kcal and 138 grams of protein. Pt's weight has dropped 5 lbs in the past 6 days. Pt nonverbal at time of visit. 0 ml residuals per nursing notes.   Labs: low potassium, high creatinine, high BIN, low GFR, low hemoglobin  Diet Order:  Diet NPO time specified  Skin:  Reviewed, no issues  Last BM:  8/21  Height:   Ht Readings from Last 1 Encounters:  11/11/14 5' 7.01" (1.702 m)    Weight:   Wt Readings from Last 1 Encounters:  11/23/14 192 lb (87.091 kg)    Ideal Body Weight:  67.2 kg  BMI:  Body mass index is 30.06 kg/(m^2).  Estimated Nutritional Needs:   Kcal:  2000-2400  Protein:  95-110 grams  Fluid:  >/= 1.2 L/day  EDUCATION NEEDS:   No  education needs identified at this time  Dorothea Ogle RD, LDN Inpatient Clinical Dietitian Pager: [phone] After Hours Pager: [phone]

## 2014-11-23 NOTE — Progress Notes (Signed)
STROKE TEAM PROGRESS NOTE   HISTORY [name] is an 59 y.o. female who was at dialysis and finished her session. After dialysis while still at the center she was noted to have sudden onset right sided weakness, left gaze deviation (LKW 11/10/2014 at 1106). EMS was called and on site BG was noted to be 50. Patient was given D50 and was transported to [facility] ED. On arrival she had labored respirations was not protecting her airway, and required intubation. CT of head was delayed due to need for intubation. CT head obtained and showed no acte stroke or bleed. Patient was deemed to be a candidate for TPA and was brought back to the ED. Dialysis needles were still present in the patient's AV fistula. IV team was consulted and removed the hemodialysis needles as well as achieved hemostasis. IV tPA was then administered. CT angiogram of the head and neck was obtained which showed no large vessel proximal occlusion. She was subsequently admitted to the neuro intensive care unit for further management.    SUBJECTIVE (INTERVAL HISTORY) Her daughter is at the bedside. She does not want her mom to go to a nursing home. She wants her mom to go to rehab here in the hospital like she has in the    OBJECTIVE Temp:  [98.3 F (36.8 C)-99 F (37.2 C)] 98.5 F (36.9 C) (08/22 0500) Pulse Rate:  [89-103] 92 (08/22 0500) Cardiac Rhythm:  [-] Normal sinus rhythm (08/22 0852) Resp:  [22-28] 23 (08/22 0500) BP: (104-141)/(68-98) 130/88 mmHg (08/22 0500) SpO2:  [87 %-100 %] 93 % (08/22 0729) Weight:  [87.091 kg (192 lb)] 87.091 kg (192 lb) (08/22 0500)   Recent Labs Lab 11/22/14 1644 11/22/14 2029 11/22/14 2359 11/23/14 0355 11/23/14 0747  GLUCAP 75 73 76 86 91    Recent Labs Lab 11/17/14 0249 11/18/14 0217 11/19/14 0841 11/20/14 0243 11/21/14 0859 11/22/14 0208 11/23/14 0441  NA 137 140 141 141 141 138 137  K 4.3 3.5 5.0 3.4* 3.8 3.2* 3.1*  CL 100* 101 102 102 104 98* 98*  CO2 21* 28 27 27 22  27 26   GLUCOSE 93 99 93 97 79 97 96  BUN 119* 62* 99* 70* 102* 47* 75*  CREATININE 8.37* 5.19* 6.89* 5.79* 7.32* 4.09* 5.88*  CALCIUM 8.8* 8.6* 8.8* 8.8* 8.9 8.5* 8.5*  MG 2.1 1.9  --   --   --   --   --   PHOS 6.3* 3.9 5.8*  --  6.5*  --   --     Recent Labs Lab 11/19/14 0841 11/21/14 0859  ALBUMIN 2.0* 2.2*    Recent Labs Lab 11/18/14 0217 11/20/14 0243 11/21/14 0900 11/22/14 0208 11/23/14 0441  WBC 7.5 6.3 5.4 5.5 6.2  HGB 11.0* 9.5* 10.3* 10.5* 10.2*  HCT 34.4* 30.5* 33.1* 33.5* 32.1*  MCV 89.8 88.7 88.7 88.6 87.2  PLT 262 269 267 265 295   No results for input(s): CKTOTAL, CKMB, CKMBINDEX, TROPONINI in the last 168 hours. No results for input(s): LABPROT, INR in the last 72 hours. No results for input(s): COLORURINE, LABSPEC, PHURINE, GLUCOSEU, HGBUR, BILIRUBINUR, KETONESUR, PROTEINUR, UROBILINOGEN, NITRITE, LEUKOCYTESUR in the last 72 hours.  Invalid input(s): APPERANCEUR     Component Value Date/Time   CHOL 135 11/11/2014 0330   TRIG 95 11/11/2014 0330   HDL 37* 11/11/2014 0330   CHOLHDL 3.6 11/11/2014 0330   VLDL 19 11/11/2014 0330   LDLCALC 79 11/11/2014 0330   Lab Results  Component Value  Date   HGBA1C 4.8 11/11/2014      Component Value Date/Time   LABOPIA NONE DETECTED 11/10/2014 1336   COCAINSCRNUR NONE DETECTED 11/10/2014 1336   LABBENZ POSITIVE* 11/10/2014 1336   AMPHETMU NONE DETECTED 11/10/2014 1336   THCU POSITIVE* 11/10/2014 1336   LABBARB NONE DETECTED 11/10/2014 1336    No results for input(s): ETH in the last 168 hours.   IMAGING  MRI Brain without contrast 11/12/2014 1. Moderate size left MCA territory infarct affecting the left insula and operculum. No associated hemorrhage or mass effect. 2. Expected evolution of the posterior right ACA and posterior left MCA infarcts seen in March.  Ct Head Wo Contrast 11/10/2014    No evidence for post t-PA hemorrhage. Early cytotoxic edema now noted in the LEFT hemisphere.    11/10/2014   1.  No acute intracranial pathology.     Ct Angio Head & Neck W/cm &/or Wo/cm 11/10/2014    1. 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  2. No proximal large vessel occlusion.  3. Moderate irregular narrowing and attenuation of ACA branch vessels, progressed from prior MRA.  4. Mild-to-moderate plaque in the cervical carotid arteries without stenosis.  5. Mild proximal left vertebral artery stenosis.    Portable Chest Xray 11/21/2014   IMPRESSION: No change from the prior exam. Persistent left lower lobe consolidation is noted.    11/11/2014    1. Lines and tubes in stable position. 2. New onset of dense left lower lobe atelectasis and consolidation. Small left pleural effusion.    11/10/2014   Endotracheal tube terminates 3 cm above the carina.     EEG : mild global cerebral dysfunction as seen in a wide variety of toxic metabolic encephalopathies and degenerative brain disorders. No electrographic seizures noted.  2D-Echo 11/12/14 - Left ventricle: The cavity size was moderately reduced. Wallthickness was increased in a pattern of severe LVH. Systolicfunction was normal. The estimated ejection fraction was in therange of 60% to 65%. Wall motion was normal; there were noregional wall motion abnormalities. - Right ventricle: The cavity size was mildly dilated. Systolicfunction was mildly reduced. - Right atrium: The atrium was severely dilated. - Tricuspid valve: There was severe regurgitation. - Pulmonary arteries: Systolic pressure was severely increased. PApeak pressure: 86 mm Hg (S). - Pericardium, extracardiac: A small pericardial effusion wasidentified circumferential to the heart. There was no evidence ofhemodynamic compromise.   PHYSICAL EXAM  Temp:  [98.3 F (36.8 C)-99 F (37.2 C)] 98.5 F (36.9 C) (08/22 0500) Pulse Rate:  [89-103] 92 (08/22 0500) Resp:  [22-28] 23 (08/22 0500) BP: (104-141)/(68-98) 130/88 mmHg (08/22 0500) SpO2:  [87 %-100 %] 93 % (08/22  0729) Weight:  [87.091 kg (192 lb)] 87.091 kg (192 lb) (08/22 0500)  General - obese, well developed, on nasal trumpet, respiratory distress much improved from yesterday.  Ophthalmologic - Fundi not visualized due to noncooperation.  Cardiovascular - Regular rate and rhythm.  Lung - bilateral coarse rhonchi and wheezing sound.  Neuro - On right nasal trumpet, mild respiratory distress. Awake and open eyes spontaneously, but still not following commands. PERRL, able to track objects intermittently, but still left gaze preference but able to cross midline intermittently, doll's eye present, corneal, gag and cough present. RUE 0/5 proximal but 3/5 distally, LUE localize to pain with spontaneous movement and against gravity, LLE 2/5 on pain, and RLE 2/5 on pain, b/l unsustained ankle clonus. Increased muscle tone RUE and BLE. Babinski mute bilaterally. Sensory, coordination and gait  not tested.   ASSESSMENT: [name] is a 59 y.o. female with history of HTN, ESRD on HD, SVT, PFO, previous stroke, COPD, AAA, and recent bowel ischemia leading to resection admitted for right sided weakness, left gaze deviation. She received IV t-PA 11/10/2014 at 1340.   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  CTA head & neck L M2 branch occlusion.   2D Echo EF 60-65%   TEE 06/23/2014 PFO. Severe RV dysfunction, RAE, TR. Incessant atrial tachycardia  LE venous dopplers negative for DVT  LDL 79  HgbA1c 4.8  SCDs for VTE prophylaxis (listed heparin antibody positive, not able to do arixtra due to ESRD, however, pt was given heparin for HD) Diet NPO time specified  aspirin 81 mg orally every day prior to admission, now on ASA 325mg  daily.   Ongoing aggressive stroke risk factor management  CCM had been following since admission, signed off yesterday.  The recommend palliative care should family become agreeable.  Therapy recommendations:  Pending. Therapies reordered 8/21. Will also get a CIR consult as daughter does not prefer SNF at discharge; her goal is to take her mother home.  Disposition:  CIR vs SNF (active with [facility] RN, PT and OT PTA)  Paroxysmal Atrial Fibrillation  Documented on tele 07/27/2014  CHADS-VASc is 4 (stroke, HTN, Female).   Decided not an anticoagulation candidate 07/2014 as risk outweighs benefits given multiple medical issues (see note 07/28/2014 from Dr. Anne Fu -"Ultimately deserves anticoagulation (coumadin). Given her multitude of co morbidities (including fistula) she would not be an optimal candidate for anticoagulation. Risks outweigh benefits. Family understands risk of future stroke/ thrombus.")  May reconsider anticoagulation with coumadin if family still aggressive on treatment and pt medical condition improves        Condition Points   C CHF (or Left ventricular systolic dysfunction) 0   H HTN: BP consistently above 140/90 mmHg (or treated HTN on meds)  1   A2 Age ?75 years 0   D DM 0   S2 Prior Stroke or TIA or thromboembolism 2   V Vascular disease (e.g. PAD, MI, aortic plaque) 0   A Age 90-74 years 0   [place] female sex  1     Annual Stroke Risk CHA2DS2-VASc Score Stroke Risk % 95% CI  0 0  1 1.3  2 2.2  3 3.2  4 4.0  5 6.7  6 9.8  7 9.6  8 12.5  9 15.2     HAS-Bled Points   H HTN: uncontrolled, >160 mmHg systolic 1   A2 Abnormal renal function: Dialysis, transplant, Cr >2.6 mg/dL or >161 mol/L Abnormal liver function: Cirrhosis or Bilirubin >2x Normal or AST/ALT/AP >3x Normal 1 0   S Prior history of stroke  1   B Bleeding: prior major bleeding or predisposition to bleeding 0  L Labile INR: unstable / high INRs, time in therapeutic range < 60% 0  E Elderly: age > 65 years Medication Usage Predisposing to Bleeding: (Antiplatelet agents, NSAIDs) 0 0  D  Prior alcohol or drug usage  history 0    HAS-BLED score n Bleeds, n Bleeds/100 patients  0 798 9 1.13  1 1286 13 1.02  2 744 14 1.88  3 187 7 3.74  4 46 4 8.70  5 8 1  12.50  Previous stroke  Prolonged hospitalization from 06/17/14 to 09/11/14  Right hemisphere acute infarcts involving right ACA, left PCA, and left MCA, cardioembolic pattern, pt w/ PAF  Respiratory distress  Extubated 11/20/14  Copious secretions  Put on chest PT Q4, NT suction and mucomyst nebs Q6  Has albuterol PRN  Has atrovent and Xopenex Q6  Code status DNR  Dysphagia  Secondary to stroke  PEG placed  Tube feedings   ? Possible seizure  Intermittent, arrhythmic twitching of R hand  Started on Keppra, 1 gm load followed by 500 mg bid  EEG negative for seizure  AcceleratedHypertension   BP 189/118 on arrival  stable  Not on BP meds   Hyperlipidemia  Home meds:  No statin  LDL 79, goal < 70  On lipitor  daily  Continue statin on discharge  Hypoglycemia  Treatment required during hospitalization  HgbA1c 4.8, at goal < 7.0  Controlled  Other Stroke Risk Factors  Cigarette smoker  UDS positive for benzos and THC  Obesity, Body mass index is 30.06 kg/(m^2).   Known PFO  Other Active Problems  ESRD on HD T, TH, S. Unless improves, not a candidate for OP HD per renal  AAA 3 cm by CT 08/2014  Hypokalemia - on supplement  Other Pertinent History  Recent admission for bowel ischemia and resection [place] 2016; palliative care heavily involved.  DNR - family has not been receptive to palliative care in the past  Hospital day # 13  Rhoderick [facility] See Amion for Pager information 11/23/2014 1:43 PM  I have personally examined this patient, reviewed notes, independently viewed imaging studies, participated in medical decision making and plan of care. I have made any additions or clarifications directly to the above note. Agree with note above.  Long discussion with  the daughter at the bedside and answered questions Delia Heady, MD Medical Director [facility] Pager: [phone] 11/23/2014 2:46 PM  To contact Stroke Continuity provider, please refer to [email or web address]. After hours, contact General Neurology

## 2014-11-24 ENCOUNTER — Inpatient Hospital Stay (HOSPITAL_COMMUNITY): Payer: Medicaid Other

## 2014-11-24 ENCOUNTER — Encounter: Payer: Medicaid Other | Admitting: Vascular Surgery

## 2014-11-24 DIAGNOSIS — J9600 Acute respiratory failure, unspecified whether with hypoxia or hypercapnia: Secondary | ICD-10-CM | POA: Insufficient documentation

## 2014-11-24 LAB — GLUCOSE, CAPILLARY
GLUCOSE-CAPILLARY: 90 mg/dL (ref 65–99)
Glucose-Capillary: 103 mg/dL — ABNORMAL HIGH (ref 65–99)
Glucose-Capillary: 106 mg/dL — ABNORMAL HIGH (ref 65–99)
Glucose-Capillary: 113 mg/dL — ABNORMAL HIGH (ref 65–99)

## 2014-11-24 LAB — BASIC METABOLIC PANEL
ANION GAP: 12 (ref 5–15)
BUN: 92 mg/dL — ABNORMAL HIGH (ref 6–20)
CALCIUM: 8.9 mg/dL (ref 8.9–10.3)
CHLORIDE: 102 mmol/L (ref 101–111)
CO2: 25 mmol/L (ref 22–32)
Creatinine, Ser: 7.44 mg/dL — ABNORMAL HIGH (ref 0.44–1.00)
GFR calc non Af Amer: 5 mL/min — ABNORMAL LOW (ref >60)
GFR, EST AFRICAN AMERICAN: 6 mL/min — AB (ref >60)
Glucose, Bld: 112 mg/dL — ABNORMAL HIGH (ref 65–99)
Potassium: 4.4 mmol/L (ref 3.5–5.1)
Sodium: 139 mmol/L (ref 135–145)

## 2014-11-24 LAB — CBC
HEMATOCRIT: 31.1 % — AB (ref 36.0–46.0)
HEMOGLOBIN: 10 g/dL — AB (ref 12.0–15.0)
MCH: 27.8 pg (ref 26.0–34.0)
MCHC: 32.2 g/dL (ref 30.0–36.0)
MCV: 86.4 fL (ref 78.0–100.0)
Platelets: 280 10*3/uL (ref 150–400)
RBC: 3.6 MIL/uL — ABNORMAL LOW (ref 3.87–5.11)
RDW: 18.2 % — AB (ref 11.5–15.5)
WBC: 6.1 10*3/uL (ref 4.0–10.5)

## 2014-11-24 MED ORDER — DARBEPOETIN ALFA 100 MCG/0.5ML IJ SOSY
PREFILLED_SYRINGE | INTRAMUSCULAR | Status: AC
Start: 2014-11-24 — End: 2014-11-24
  Filled 2014-11-24: qty 0.5

## 2014-11-24 MED ORDER — NEPRO/CARBSTEADY PO LIQD
237.0000 mL | ORAL | Status: DC | PRN
  Filled 2014-11-24: qty 237

## 2014-11-24 MED ORDER — MORPHINE SULFATE (PF) 2 MG/ML IV SOLN
INTRAVENOUS | Status: AC
  Filled 2014-11-24: qty 1

## 2014-11-24 MED ORDER — SODIUM CHLORIDE 0.9 % IV SOLN: 100.0000 mL | INTRAVENOUS | Status: DC | PRN

## 2014-11-24 MED ORDER — LIDOCAINE-PRILOCAINE 2.5-2.5 % EX CREA
1.0000 "application " | TOPICAL_CREAM | CUTANEOUS | Status: DC | PRN
  Filled 2014-11-24: qty 5

## 2014-11-24 MED ORDER — ALTEPLASE 2 MG IJ SOLR
2.0000 mg | Freq: Once | INTRAMUSCULAR | Status: DC | PRN
  Filled 2014-11-24: qty 2

## 2014-11-24 MED ORDER — PENTAFLUOROPROP-TETRAFLUOROETH EX AERO
1.0000 | INHALATION_SPRAY | CUTANEOUS | Status: DC | PRN
Start: 2014-11-24 — End: 2014-11-24

## 2014-11-24 MED ORDER — MORPHINE SULFATE (PF) 2 MG/ML IV SOLN
1.0000 mg | INTRAVENOUS | Status: DC | PRN
  Administered 2014-11-24: 2 mg via INTRAVENOUS

## 2014-11-24 MED ORDER — LIDOCAINE HCL (PF) 1 % IJ SOLN: 5.0000 mL | INTRAMUSCULAR | Status: DC | PRN

## 2014-11-24 MED ORDER — HEPARIN SODIUM (PORCINE) 1000 UNIT/ML DIALYSIS
1000.0000 [IU] | INTRAMUSCULAR | Status: DC | PRN
  Filled 2014-11-24: qty 1

## 2014-11-24 MED ORDER — METHYLPREDNISOLONE SODIUM SUCC 125 MG IJ SOLR
125.0000 mg | Freq: Once | INTRAMUSCULAR | Status: AC
  Administered 2014-11-24: 125 mg via INTRAVENOUS
  Filled 2014-11-24: qty 2

## 2014-11-24 NOTE — Progress Notes (Signed)
PT RESTING QUIETLY IN BED, NO OBVIOUS ACUTE DISTRESS. NO TACHYPNEA. COMFORT MEASURES PERFORMED. TUBE FEED ON HOLD FOR  NOW. RR AT 20, SAT >%90, MORPHINE GIVEN WITH GOOD RESULT. TURNED Q2H.

## 2014-11-24 NOTE — Significant Event (Signed)
Rapid Response Event Note  Overview:  Called to see patient with resp distress Time Called: 1456 Arrival Time: 1502 Event Type: Respiratory  Initial Focused Assessment:  Patient just returned from HD in resp distress per RN staff.  On arrival patient alert - hot and diaphoretic - alert - anxious looking - not able to follow commands but does look to voice - labored resps using accessory muscles - rate 36-42 - on 6 liter nasal cannula - O2 sats 88-92%.  Bil BS present - coarse rhonchi - some exp wheezing noted after suctioning oral airway for thin frothy sputum - abd soft - BP 128/78 HR 117 - ST on monitor.  Staff reports 500 cc fluid removal in HD.  Also report of RT nasotracheal suction during HD.   Interventions: Stat PCXR requested - NT suction for moderate amounts of thin white frothy secretions - placed on NRB mask - stat nebulizer given - Dr. Lendell Caprice to bedside.  Annie Main NP at bedside.  Some RR relief.  Daughter back to room - updated by Dr. Pearlean Brownie.  Solumedrol 125 mg IV given per order.  Daughter back to room - Dr. Lendell Caprice speaking with daughter - updates given - patient will remain DNR - not a Bipap candidate.  Morphine IV to be given per order for resp distress.  Handoff to [employer] - daughter updated with plan.  BP 114/71 HR 103 RR 26 O2sats 97% on 50% VM.     Event Summary: Name of Physician Notified: Dr. Lendell Caprice at  (pta rrt)  Name of Consulting Physician Notified: Dr. Konrad Felix NP at  (pta RRT)        [place]

## 2014-11-24 NOTE — Progress Notes (Signed)
STROKE TEAM PROGRESS NOTE   SUBJECTIVE (INTERVAL HISTORY) Patient in HD, no complaints. Nasal trumpet remains intact.    OBJECTIVE Temp:  [98.1 F (36.7 C)-99.1 F (37.3 C)] 98.1 F (36.7 C) (08/23 1043) Pulse Rate:  [100-108] 106 (08/23 1300) Cardiac Rhythm:  [-] Sinus tachycardia (08/23 0804) Resp:  [20-32] 32 (08/23 1300) BP: (85-140)/(61-95) 130/90 mmHg (08/23 1300) SpO2:  [90 %-97 %] 96 % (08/23 1300) Weight:  [87.7 kg (193 lb 5.5 oz)-89.268 kg (196 lb 12.8 oz)] 87.7 kg (193 lb 5.5 oz) (08/23 1043)   Recent Labs Lab 11/23/14 1109 11/23/14 1631 11/23/14 2026 11/24/14 0023 11/24/14 0416  GLUCAP 107* 86 84 103* 113*    Recent Labs Lab 11/18/14 0217 11/19/14 0841 11/20/14 0243 11/21/14 0859 11/22/14 0208 11/23/14 0441 11/24/14 0604  NA 140 141 141 141 138 137 139  K 3.5 5.0 3.4* 3.8 3.2* 3.1* 4.4  CL 101 102 102 104 98* 98* 102  CO2 28 27 27 22 27 26 25   GLUCOSE 99 93 97 79 97 96 112*  BUN 62* 99* 70* 102* 47* 75* 92*  CREATININE 5.19* 6.89* 5.79* 7.32* 4.09* 5.88* 7.44*  CALCIUM 8.6* 8.8* 8.8* 8.9 8.5* 8.5* 8.9  MG 1.9  --   --   --   --   --   --   PHOS 3.9 5.8*  --  6.5*  --   --   --     Recent Labs Lab 11/19/14 0841 11/21/14 0859  ALBUMIN 2.0* 2.2*    Recent Labs Lab 11/20/14 0243 11/21/14 0900 11/22/14 0208 11/23/14 0441 11/24/14 0604  WBC 6.3 5.4 5.5 6.2 6.1  HGB 9.5* 10.3* 10.5* 10.2* 10.0*  HCT 30.5* 33.1* 33.5* 32.1* 31.1*  MCV 88.7 88.7 88.6 87.2 86.4  PLT 269 267 265 295 280    PHYSICAL EXAM  Temp:  [98.1 F (36.7 C)-99.1 F (37.3 C)] 98.1 F (36.7 C) (08/23 1043) Pulse Rate:  [100-108] 106 (08/23 1300) Resp:  [20-32] 32 (08/23 1300) BP: (85-140)/(61-95) 130/90 mmHg (08/23 1300) SpO2:  [90 %-97 %] 96 % (08/23 1300) Weight:  [87.7 kg (193 lb 5.5 oz)-89.268 kg (196 lb 12.8 oz)] 87.7 kg (193 lb 5.5 oz) (08/23 1043)  General - obese, well developed, nasal trumpet in place, respiratory distress much improved from  yesterday. Ophthalmologic - Fundi not visualized due to noncooperation. Cardiovascular - Regular rate and rhythm. Lung - bilateral coarse rhonchi and wheezing sound. Neuro - On right nasal trumpet, mild respiratory distress. Awake and open eyes spontaneously, but still not following commands. PERRL, able to track objects intermittently, but still left gaze preference but able to cross midline intermittently, doll's eye present, corneal, gag and cough present. RUE 0/5 proximal but 3/5 distally, LUE localize to pain with spontaneous movement and against gravity, LLE 2/5 on pain, and RLE 2/5 on pain, b/l unsustained ankle clonus. Increased muscle tone RUE and BLE. Babinski mute bilaterally. Sensory, coordination and gait not tested.   ASSESSMENT: [name] is a 59 y.o. female with history of HTN, ESRD on HD, SVT, PFO, previous stroke, COPD, AAA, and recent bowel ischemia leading to resection admitted for right sided weakness, left gaze deviation. She received IV t-PA 11/10/2014 at 1340.   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  CTA head & neck L M2 branch occlusion.  2D Echo EF 60-65%   TEE 06/23/2014 PFO. Severe RV dysfunction, RAE, TR. Incessant atrial tachycardia  LE venous dopplers negative for DVT  LDL 79  HgbA1c 4.8  SCDs for VTE prophylaxis (listed heparin antibody positive, not able to do arixtra due to ESRD, however, pt was given heparin for HD) Diet NPO time specified  aspirin 81 mg orally every day prior to admission, now on ASA 325mg  daily.   Ongoing aggressive stroke risk factor management  CCM has signed.  Therapy recommendations:  CIR  Disposition:  CIR vs SNF (active with [facility] RN, PT and OT PTA, daughter ultimately would like to take home) - admissions coordinator is following - had considered transfer today -  will re-evaluate tomorrow. Concerned due to increase WOB.  Paroxysmal Atrial Fibrillation  Documented on tele 07/27/2014  CHADS-VASc is 4 (stroke, HTN, Female).   Decided not an anticoagulation candidate 07/2014 as risk outweighs benefits given multiple medical issues (see note 07/28/2014 from Dr. Anne Fu -"Ultimately deserves anticoagulation (coumadin). Given her multitude of co morbidities (including fistula) she would not be an optimal candidate for anticoagulation. Risks outweigh benefits. Family understands risk of future stroke/ thrombus.")  May reconsider anticoagulation with coumadin if family still aggressive on treatment and pt medical condition improves        Previous stroke  Prolonged hospitalization from 06/17/14 to 09/11/14  Right hemisphere acute infarcts involving right ACA, left PCA, and left MCA, cardioembolic pattern, pt w/ PAF  Respiratory distress  Extubated 11/20/14  Copious secretions, using nasal trumpet to suction as needed  Put on chest PT Q4, NT suction and mucomyst nebs Q6  Has albuterol PRN  Has atrovent and Xopenex Q6  Code status DNR  Dysphagia  Secondary to stroke  PEG placed  Tolerating ube feedings   Possible seizure  Intermittent, arrhythmic twitching of R hand  Started on Keppra, 1 gm load followed by 500 mg bid  EEG negative for seizure  AcceleratedHypertension   BP 189/118 on arrival  stable  Not on BP meds   Hyperlipidemia  Home meds:  No statin  LDL 79, goal < 70  On lipitor 10mg  daily  Continue statin on discharge  Hypoglycemia  Treatment required during hospitalization  HgbA1c 4.8, at goal < 7.0  Controlled  Other Stroke Risk Factors  Cigarette smoker  UDS positive for benzos and THC  Obesity, Body mass index is 30.27 kg/(m^2).   Known PFO  Other Active Problems  ESRD on HD T, TH, S. Unless improves, not a candidate for OP HD per renal  AAA 3 cm by CT 08/2014  Hypokalemia - on  supplement  Other Pertinent History  Recent admission for bowel ischemia and resection [name] 2016; palliative care heavily involved.  DNR - family has not been receptive to palliative care in the past  Hospital day # 14  Rhoderick Moody [facility] See Amion for Pager information 11/24/2014 2:55 PM  I have personally examined this patient, reviewed notes, independently viewed imaging studies, participated in medical decision making and plan of care. I have made any additions or clarifications directly to the above note. Agree with note above.   Delia Heady, MD Medical Director [facility] Pager: [phone] 11/24/2014 1:40 PM ADDENDUM : called by rehab team informingpatient developed resp distress this afternoon, Dr Lendell Caprice from medical hospitalist team and rapid response team consulted. Patient placed on oxygen mask and suctioned. Patient is DNR. D/w daughter at bedside she wants aggressive care but no  intubation. Prognosis poor  Dr Lendell Caprice to discuss possible palliative care with daughter Delia Heady, MD To contact Stroke Continuity provider, please refer to [email or web address]. After hours, contact General Neurology

## 2014-11-24 NOTE — Progress Notes (Signed)
Rehab admissions - I spoke with patient's daughter.  Daughter would like for patient to admit to acute inpatient rehab.  Patient well known to me from previous rehab admission 09/11/14.  Bed available today and will admit to acute inpatient rehab later today.  Call me for questions.  #[phone]

## 2014-11-24 NOTE — Progress Notes (Signed)
SLP Cancellation Note  Patient Details Name: [name] MRN: [id number] DOB: [birth year]   Cancelled treatment:       Reason Eval/Treat Not Completed: Patient at procedure or test/unavailable.  SLP will follow up as able.  Fae Pippin, M.A., CCC-SLP [phone]  [name] 11/24/2014, 12:16 PM

## 2014-11-24 NOTE — PMR Pre-admission (Addendum)
PMR Admission Coordinator Pre-Admission Assessment  Patient: [name] is an 59 y.o., female MRN: [id number] DOB: [birth year] Height: 5' 7.01" (170.2 cm) Weight: 87.2 kg (192 lb 3.9 oz)              Insurance Information HMO:      PPO:       PCP:       IPA:       80/20:       OTHER:   PRIMARY: Medicaid      Policy#: [id number]      Subscriber: [name] CM Name:        Phone#:       Fax#:   Pre-Cert#:        Employer: Not employed Benefits:  Phone #: [phone]     Name: Automated Eff. Date: 11/24/14 eligible     Deduct:        Out of Pocket Max:        Life Max:   CIR:        SNF:   Outpatient:       Co-Pay:   Home Health:        Co-Pay:   DME:       Co-Pay:   Providers:    Medicaid Application Date:        Case Manager:   Disability Application Date:        Case Worker:    Emergency Contact Information Contact Information    Name Relation Home Work Mobile   [name],Stephanie Daughter [phone]     [name], Stephanie   [phone]     Current Medical History  Patient Admitting Diagnosis:  L MCA infarct  History of Present Illness: A 59 y.o. 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 Interventional radiology, received inpatient rehabilitation services maintain on aspirin 81 mg for CVA prophylaxis. She was discharged to home with family ambulating short household distances using a rolling walker and minimal assist to contact-guard. Presented 11/10/2014 with right-sided weakness and left gaze deviation. Patient did require intubation and mechanical ventilation for acute respiratory failure and extubated 11/20/2014. MRI of the brain showed moderate size left MCA territory infarct affecting the left insula and operculum. Expected evolution of posterior right ACA  and posterior left MCA infarcts seen in March. Patient did receive TPA. Echocardiogram with ejection fraction of 65% no wall motion abnormalities. EEG consistent with toxic metabolic encephalopathy and no seizure activity noted as she continued on Keppra for seizure prophylaxis. CT angiogram head and neck shows no large vessel proximal occlusion. Neurology consulted aspirin increased to 325 mg daily. Hemodialysis ongoing as per renal services. Patient currently nothing by mouth with PEG tube feeds for nutritional support. Noted patient with respiratory distress tachypnea and increased anxiety while in dialysis 11/24/2014. Oxygen saturation is 88 and 92% . Chest x-ray completed showing partial improvement of recent left lower lobe airspace consolidation. Patient did receive Solu-Medrol intravenously as well as adjustments in chronic nebulizer treatments for history of asthma. Morphine IV was given due to her respiratory distress. Patient improved greatly after suctioning and pulmonary toileting. Her scheduled Xanax that she received on dialysis days was resumed. Physical and occupational therapy evaluations completed with recommendations of physical medicine rehabilitation consult. Patient to be admitted for comprehensive inpatient rehabilitation program.    Total: 22=NIH  Past Medical  History  Past Medical History  Diagnosis Date  . Asthma   . Hypertension   . Gout   . Arthritis   . Insomnia   . Chronic kidney disease   . Depression   . Renal insufficiency   . Aortic aneurysm without rupture     3cm by CT 08/03/14  . Paroxysmal SVT (supraventricular tachycardia)   . Cor pulmonale   . Stroke 06/2014  . PFO (patent foramen ovale)   . COPD (chronic obstructive pulmonary disease)     Family History  family history includes Diabetes in her father, mother, and sister; Heart attack in her mother and sister; Heart disease in her brother, father, mother, and sister; Hyperlipidemia in her daughter;  Hypertension in her father, mother, and sister; Peripheral vascular disease in her mother and sister.  Prior Rehab/Hospitalizations: Patient known to me from previous CIR admission 09/11/14 to 10/02/14 after a prolonged hospital stay.  Has the patient had major surgery during 100 days prior to admission? Yes.  Had a massive abdominal surgery in 03/16, Peg placed in 05/16  Current Medications   Current facility-administered medications:  .  acetaminophen (TYLENOL) tablet 650 mg, 650 mg, Oral, Q4H PRN **OR** [DISCONTINUED] acetaminophen (TYLENOL) suppository 650 mg, 650 mg, Rectal, Q4H PRN, Ulice Dash, PA-C, 650 mg at 11/12/14 1058 .  acetylcysteine (MUCOMYST) 20 % nebulizer / oral solution 3 mL, 3 mL, Nebulization, Q6H, Marvel Plan, MD, 3 mL at 11/25/14 0743 .  [START ON 11/26/2014] ALPRAZolam (XANAX) tablet 0.5 mg, 0.5 mg, Per Tube, Q T,Th,Sa-HD, Layne Benton, NP .  antiseptic oral rinse solution (CORINZ), 7 mL, Mouth Rinse, QID, Noel Christmas, 7 mL at 11/23/14 1543 .  aspirin tablet 325 mg, 325 mg, Oral, Daily, Marvel Plan, MD, 325 mg at 11/25/14 1006 .  atropine 1 % ophthalmic solution 4 drop, 4 drop, Sublingual, Q4H PRN, Simonne Martinet, NP, 4 drop at 11/20/14 1933 .  budesonide (PULMICORT) nebulizer solution 0.25 mg, 0.25 mg, Nebulization, BID, Ulice Dash, PA-C, 0.25 mg at 11/25/14 0743 .  chlorhexidine gluconate (PERIDEX) 0.12 % solution 15 mL, 15 mL, Mouth Rinse, BID, Micki Riley, MD, 15 mL at 11/25/14 0752 .  Darbepoetin Alfa (ARANESP) injection 100 mcg, 100 mcg, Intravenous, Q Tue-HD, Beryle Lathe, MD, 100 mcg at 11/24/14 1139 .  feeding supplement (NEPRO CARB STEADY) liquid 1,000 mL, 1,000 mL, Oral, Continuous, Reanne J Barbato, RD, Last Rate: 50 mL/hr at 11/23/14 1834, 1,000 mL at 11/23/14 1834 .  guaifenesin (ROBITUSSIN) 100 MG/5ML syrup 200 mg, 200 mg, Per Tube, TID, Lupita Leash, MD, 200 mg at 11/25/14 1005 .  iohexol (OMNIPAQUE) 300 MG/ML solution 50 mL, 50 mL,  Oral, Once PRN, Medication Radiologist, MD, 20 mL at 11/11/14 0012 .  ipratropium-albuterol (DUONEB) 0.5-2.5 (3) MG/3ML nebulizer solution 3 mL, 3 mL, Nebulization, Q6H, Christiane Ha, MD, 3 mL at 11/25/14 0743 .  ipratropium-albuterol (DUONEB) 0.5-2.5 (3) MG/3ML nebulizer solution 3 mL, 3 mL, Nebulization, Q3H PRN, Christiane Ha, MD, 3 mL at 11/24/14 1515 .  lanthanum (FOSRENOL) chewable tablet 500 mg, 500 mg, Per Tube, TID, Zetta Bills, MD, 500 mg at 11/25/14 1006 .  levETIRAcetam (KEPPRA) 100 MG/ML solution 500 mg, 500 mg, Per Tube, BID, Micki Riley, MD, 500 mg at 11/25/14 1005 .  levothyroxine (SYNTHROID, LEVOTHROID) tablet 25 mcg, 25 mcg, Oral, QAC breakfast, Ulice Dash, PA-C, 25 mcg at 11/25/14 1610 .  morphine 2 MG/ML injection 1-2 mg, 1-2 mg, Intravenous, Q2H  PRN, Christiane Ha, MD, 2 mg at 11/24/14 1644 .  multivitamin (RENA-VIT) tablet 1 tablet, 1 tablet, Oral, QHS, Beryle Lathe, MD, 1 tablet at 11/25/14 0013 .  pantoprazole sodium (PROTONIX) 40 mg/20 mL oral suspension 40 mg, 40 mg, Per Tube, Q1200, Cyril Mourning V, MD, 40 mg at 11/24/14 1729 .  senna-docusate (Senokot-S) tablet 1 tablet, 1 tablet, Oral, QHS PRN, Ulice Dash, PA-C  Patients Current Diet: Diet NPO time specified  Precautions / Restrictions Precautions Precautions: Fall Precaution Comments: secretions; PEG tube Restrictions Weight Bearing Restrictions: No   Has the patient had 2 or more falls or a fall with injury in the past year?No  Prior Activity Level Limited Community (1-2x/wk): Goes out with daughter and son as needed. (Goes to HD Tu-Th-Sat.)  Home Assistive Devices / Equipment Home Assistive Devices/Equipment: Other (Comment) (peg tube)  Prior Device Use: Indicate devices/aids used by the patient prior to current illness, exacerbation or injury? Walker.  Uses a seated rollator walker.  Prior Functional Level Prior Function Level of Independence: Independent with assistive  device(s) Comments: According to chart 2 months ago, family was assisting with meals and ensuring pt take her proper medication & bill managment.  Self Care: Did the patient need help bathing, dressing, using the toilet or eating?  Needed some help  Indoor Mobility: Did the patient need assistance with walking from room to room (with or without device)? Needed some help  Stairs: Did the patient need assistance with internal or external stairs (with or without device)? Needed some help  Functional Cognition: Did the patient need help planning regular tasks such as shopping or remembering to take medications? Needed some help  Current Functional Level Cognition  Arousal/Alertness: Awake/alert Overall Cognitive Status: Impaired/Different from baseline Difficult to assess due to: Impaired communication Current Attention Level: Sustained Orientation Level: Other (comment) Following Commands: Follows one step commands inconsistently, Follows one step commands with increased time General Comments: Pt able to attempt a thumbs up at end of session, using right hand after being asked. Pt very inconsistent with following commands, pt did not touch her nose or stick out her tongue with instructional/verbal, tactile, or demonstrational cues.  Attention: Focused Focused Attention: Impaired Focused Attention Impairment: Verbal basic, Functional basic Memory:  (unable to assess, suspect impaired) Awareness: Impaired Awareness Impairment: Intellectual impairment Problem Solving: Impaired Problem Solving Impairment: Verbal basic, Functional basic    Extremity Assessment (includes Sensation/Coordination)  Upper Extremity Assessment: RUE deficits/detail RUE Deficits / Details: flaccid RUE, pt unable to follow commands to move UE. However, pt would occassionally, functionally move RUE.  RUE Sensation:  (unsure) RUE Coordination: decreased fine motor, decreased gross motor  Lower Extremity Assessment:  Defer to PT evaluation RLE Deficits / Details: 2 beats of clonus at ankle. Stiffness throughout RLE. No voluntary active movement noted.  LLE Deficits / Details: Increased stiffness throughout LLE. Able to move minimially to get to EOB with max cues.     ADLs  Overall ADL's : Needs assistance/impaired Eating/Feeding: NPO Grooming: Wash/dry face, Total assistance, Sitting Upper Body Bathing: Total assistance Lower Body Bathing: Total assistance General ADL Comments: Pt with strong R lean and pushing with L UE.     Mobility  Overal bed mobility: Needs Assistance, +2 for physical assistance Bed Mobility: Supine to Sit Supine to sit: +2 for physical assistance, Max assist, HOB elevated Sit to supine: Max assist, +2 for physical assistance General bed mobility comments: pt initiate L LE movement with (A) to lift. No initiation  with R LE. pt needed hand over hand to initiate reach with L UE  No trucal or neck rotation initiated    Transfers  Overall transfer level: Needs assistance Equipment used: 2 person hand held assist Transfers: Sit to/from Stand Sit to Stand: +2 physical assistance, Mod assist Stand pivot transfers: Mod assist, +2 physical assistance General transfer comment: used pad for hip extension and bed elevated for transfer to chair    Ambulation / Gait / Stairs / Wheelchair Mobility       Posture / Balance Dynamic Sitting Balance Sitting balance - Comments: Pt pushing with L Ue and provided lateral lean WB on BIL UE. pt required BIL LE total (A) elevation to WB on L UE and maintain.  Balance Overall balance assessment: Needs assistance Sitting-balance support: Bilateral upper extremity supported, Feet supported Sitting balance-Leahy Scale: Poor Sitting balance - Comments: Pt pushing with L Ue and provided lateral lean WB on BIL UE. pt required BIL LE total (A) elevation to WB on L UE and maintain.  Postural control: Right lateral lean    Special needs/care  consideration BiPAP/CPAP No CPM No  Continuous Drip IV No Dialysis Yes, get anxious prior to HD.        Days Tu-Th-Sat Life Vest  Oxygen:  Yes, 02 4L [place].  Breathing treatments ordered.  Special Bed  Trach Size  Wound Vac (area) No    Skin Abdominal incision with dressing                              Bowel mgmt: Last BM 11/24/14, with incontinence Bladder mgmt: Voiding even though she has HD. Incontinent of urine. Diabetic mgmt No    Previous Home Environment Home Care Services: Yes Type of Home Care Services: Home RN Additional Comments: No family available to ask home living questions. Unsure of support post discharge.   Discharge Living Setting Plans for Discharge Living Setting: Patient's home, House, Lives with (comment) (Daughter lives with patient in a townhouse.) Type of Home at Discharge: Other (Comment) (Townhouse.) Discharge Home Layout: Two level, 1/2 bath on main level, Bed/bath upstairs (Can stay in living room on main level.) Alternate Level Stairs-Number of Steps: Flight Discharge Home Access: Stairs to enter Entrance Stairs-Number of Steps: 1 small step to porch and a small step into townhome. Does the patient have any problems obtaining your medications?: No  Social/Family/Support Systems Patient Roles: Parent (Has a daughter and a son.) Contact Information: [name] - daughter Anticipated Caregiver: daughter and son and friends Anticipated Caregiver's Contact Information: [name] - [phone] Ability/Limitations of Caregiver: Daughter works evenings.  Son also works but has more flexible hours.  Have friends who help with patient.  Patient is rarely ever alone. Caregiver Availability: 24/7 Discharge Plan Discussed with Primary Caregiver: Yes Is Caregiver In Agreement with Plan?: Yes Does Caregiver/Family have Issues with Lodging/Transportation while Pt is in Rehab?: No  Goals/Additional Needs Patient/Family Goal for Rehab: PT min assist, OT min  to mod assist, ST min assist goals Expected length of stay: 20-25 days Cultural Considerations: None Dietary Needs: NPO with PEG tube feedings Equipment Needs: TBD Special Service Needs: Has HD T-Th-Sat and goes to [employer] when at home. Pt/Family Agrees to Admission and willing to participate: Yes Program Orientation Provided & Reviewed with Pt/Caregiver Including Roles  & Responsibilities: Yes  Decrease burden of Care through IP rehab admission: N/A  Possible need for SNF placement upon discharge: Not  planned  Patient Condition: This patient's condition remains as documented in the consult dated 11/23/14, in which the Rehabilitation Physician determined and documented that the patient's condition is appropriate for intensive rehabilitative care in an inpatient rehabilitation facility. Daughter is aware of need for additional assistance at home after rehab stay.  Will admit to inpatient rehab today.  Preadmission Screen Completed By:  Trish Mage, 11/25/2014 11:08 AM ______________________________________________________________________   Discussed status with Dr. Riley Kill on 11/25/14 at 1107 and received telephone approval for admission today.  Admission Coordinator:  Trish Mage, time 1107/Date08/24/16

## 2014-11-24 NOTE — Progress Notes (Signed)
RT called to HD to assess pt for breathing treatment.  Upon RT arrival pt VS were RR 35, HR 110, SpO2 93% on 4LPM nasal cannula with Rhonchi noted BIL.  These VS are along the lines of pts baseline from yesterday with the exception of an increased WOB.  No wheezes were appreciated upon auscultation.  PRN NTS was performed via pts right nare where a nasopharyngeal airway was already in place.  A small amt of thick, white secretions were returned.  Coughing was induced by this suctioning and pt produced a moderate amt of thick, white secretions which were suctioned orally.  Pt nare and NPA was left intact and lubrication and sterile procedure was used.  VS post-NTS are RR 34, HR 107, SpO2 of 97%  On 6LPM nasal cannula.  Rhonchi is still noted throughout all lung fields.  Pt WOB appears to be better.  RN apprised of RT findings.

## 2014-11-24 NOTE — Progress Notes (Signed)
Called to give neb treatment.  Patient found in distress and Rapid response present.  NTS through Nasal trumpet for thick, clear, frothy secretions.  Increased O2 to 55% VM post neb treatment with Duoneb.  Sat 94-95%, HR 109.  Continuing to monitor patient.

## 2014-11-24 NOTE — Progress Notes (Signed)
SLP Cancellation Note  Patient Details Name: [name] MRN: [id number] DOB: [birth year]   Cancelled treatment:       Reason Eval/Treat Not Completed: Medical issues which prohibited therapy.  SLP will follow up as able for PO readiness.  Fae Pippin, M.A., CCC-SLP [phone]  Shandora Koogler 11/24/2014, 3:45 PM

## 2014-11-24 NOTE — Discharge Summary (Addendum)
Stroke Discharge Summary  [name]   MRN: [id number]      DOB: 08/17/1955  Date of Admission: 11/10/2014 Date of Discharge: 11/25/2014  Attending Physician:  Micki Riley, MD, Stroke MD  Consulting Physician(s):  Treatment Team:  Delano Metz, MD (nephrology), Max Fickle, MD (pulmonary/intensive care) and Faith Rogue, MD (Physical Medicine & Rehabtilitation), [facility]  Patient's PCP:  August Saucer [name], MD  Discharge Diagnoses:  Principal Problem:   Cerebral infarction due to embolism of left middle cerebral artery - Dominant left MCA infarct s/p IV tPA, embolic secondary to known atrial fibrillation  Active Problems:   Acute respiratory failure with hypoxemia   Global aphasia, secondary to stroke   Right hemiparesis, secondary to stroke   PFO (patent foramen ovale)   Essential hypertension   Paroxysmal a-fib   HLD (hyperlipidemia)   ESRD on dialysis   History of stroke   DNAR (do not attempt resuscitation)   Gastrostomy tube in place   PEG (percutaneous endoscopic gastrostomy) status Cigarette smoker UDS positive for benzos and THC Obesity, Body mass index is 30.82 kg/(m^2).    Dysphagia, Secondary to stroke   Possible seizure   AcceleratedHypertension    Hypoglycemia, resolved   ESRD   AAA   Hypokalemia, replaced   Anxiety  Past Medical History  Diagnosis Date  . Asthma   . Hypertension   . Gout   . Arthritis   . Insomnia   . Chronic kidney disease   . Depression   . Renal insufficiency   . Aortic aneurysm without rupture     3cm by CT 08/03/14  . Paroxysmal SVT (supraventricular tachycardia)   . Cor pulmonale   . Stroke 06/2014  . PFO (patent foramen ovale)   . COPD (chronic obstructive pulmonary disease)    Past Surgical History  Procedure Laterality Date  . Multiple tooth extractions    . Av fistula placement Left 01/28/2014    Procedure: ARTERIOVENOUS (AV) FISTULA CREATION;  Surgeon: Sherren Kerns, MD;   Location: [facility] OR;  Service: Vascular;  Laterality: Left;  . Tee without cardioversion N/A 06/23/2014    Procedure: TRANSESOPHAGEAL ECHOCARDIOGRAM (TEE);  Surgeon: Lewayne Bunting, MD;  Location: [facility] ENDOSCOPY;  Service: Cardiovascular;  Laterality: N/A;  . Insertion of dialysis catheter Right 06/27/2014    Procedure: INSERTION OF Right Internal Jugular DIATEK CATHETER;  Surgeon: Pryor Ochoa, MD;  Location: [facility] OR;  Service: Vascular;  Laterality: Right;  . Laparotomy N/A 07/01/2014    Procedure: EXPLORATORY LAPAROTOMY WITH CLOSURE OF PERFORATED PYLORIC ULCER;  Surgeon: Claud Kelp, MD;  Location: [facility] OR;  Service: General;  Laterality: N/A;  . Bowel resection N/A 07/01/2014    Procedure: SMALL BOWEL RESECTION;  Surgeon: Claud Kelp, MD;  Location: [facility] OR;  Service: General;  Laterality: N/A;  . Revison of arteriovenous fistula Left 09/09/2014    Procedure: LIGATION OF COMPETING BRANCH, & RESECTION OF VENOUS ANEURYSM OF LEFT UPPER ARM ARTERIOVENOUS FISTULA;  Surgeon: Larina Earthly, MD;  Location: [facility] OR;  Service: Vascular;  Laterality: Left;    Medications to be continued on Rehab . acetylcysteine  3 mL Nebulization Q6H  . [START ON 11/26/2014] ALPRAZolam  0.5 mg Per Tube Q T,Th,Sa-HD  . antiseptic oral rinse  7 mL Mouth Rinse QID  . aspirin  325 mg Oral Daily  . budesonide  0.25 mg Nebulization BID  . chlorhexidine gluconate  15 mL Mouth Rinse BID  . darbepoetin (  ARANESP) injection - DIALYSIS  100 mcg Intravenous Q Tue-HD  . guaifenesin  200 mg Per Tube TID  . ipratropium-albuterol  3 mL Nebulization Q6H  . lanthanum  500 mg Per Tube TID  . levETIRAcetam  500 mg Per Tube BID  . levothyroxine  25 mcg Oral QAC breakfast  . multivitamin  1 tablet Oral QHS  . pantoprazole sodium  40 mg Per Tube Q1200    LABORATORY STUDIES CBC    Component Value Date/Time   WBC 6.1 11/24/2014 0604   WBC 6.8 10/02/2012 1046   RBC 3.60* 11/24/2014 0604   RBC 4.87 10/02/2012 1046   HGB 10.0* 11/24/2014  0604   HGB 13.1 10/02/2012 1046   HCT 31.1* 11/24/2014 0604   HCT 40.3 10/02/2012 1046   PLT 280 11/24/2014 0604   PLT 359 10/02/2012 1046   MCV 86.4 11/24/2014 0604   MCV 82.6 10/02/2012 1046   MCH 27.8 11/24/2014 0604   MCH 26.8 10/02/2012 1046   MCHC 32.2 11/24/2014 0604   MCHC 32.5 10/02/2012 1046   RDW 18.2* 11/24/2014 0604   RDW 17.4* 10/02/2012 1046   LYMPHSABS 0.6* 11/11/2014 0330   LYMPHSABS 1.9 10/02/2012 1046   MONOABS 1.2* 11/11/2014 0330   MONOABS 0.8 10/02/2012 1046   EOSABS 0.2 11/11/2014 0330   EOSABS 0.2 10/02/2012 1046   BASOSABS 0.0 11/11/2014 0330   BASOSABS 0.1 10/02/2012 1046   CMP    Component Value Date/Time   NA 139 11/24/2014 0604   NA 139 10/02/2012 1046   K 4.4 11/24/2014 0604   K 4.4 10/02/2012 1046   CL 102 11/24/2014 0604   CO2 25 11/24/2014 0604   CO2 25 10/02/2012 1046   GLUCOSE 112* 11/24/2014 0604   GLUCOSE 110 10/02/2012 1046   BUN 92* 11/24/2014 0604   BUN 35.2* 10/02/2012 1046   CREATININE 7.44* 11/24/2014 0604   CREATININE 2.5* 10/02/2012 1046   CALCIUM 8.9 11/24/2014 0604   CALCIUM 9.8 10/02/2012 1046   PROT 6.4* 11/16/2014 0343   PROT 8.4* 10/02/2012 1046   ALBUMIN 2.2* 11/21/2014 0859   ALBUMIN 3.1* 10/02/2012 1046   AST 22 11/16/2014 0343   AST 9 10/02/2012 1046   ALT 11* 11/16/2014 0343   ALT <6 Repeated and Verified 10/02/2012 1046   ALKPHOS 67 11/16/2014 0343   ALKPHOS 108 10/02/2012 1046   BILITOT 0.8 11/16/2014 0343   BILITOT 0.21 10/02/2012 1046   GFRNONAA 5* 11/24/2014 0604   GFRAA 6* 11/24/2014 0604   COAGS Lab Results  Component Value Date   INR 1.29 11/10/2014   INR 1.28 08/14/2014   INR 1.19 08/12/2014   Lipid Panel    Component Value Date/Time   CHOL 135 11/11/2014 0330   TRIG 95 11/11/2014 0330   HDL 37* 11/11/2014 0330   CHOLHDL 3.6 11/11/2014 0330   VLDL 19 11/11/2014 0330   LDLCALC 79 11/11/2014 0330   HgbA1C  Lab Results  Component Value Date   HGBA1C 4.8 11/11/2014   Cardiac  Panel (last 3 results) No results for input(s): CKTOTAL, CKMB, TROPONINI, RELINDX in the last 72 hours. Urinalysis    Component Value Date/Time   COLORURINE YELLOW 11/10/2014 1336   APPEARANCEUR CLOUDY* 11/10/2014 1336   LABSPEC 1.013 11/10/2014 1336   PHURINE 5.0 11/10/2014 1336   GLUCOSEU NEGATIVE 11/10/2014 1336   HGBUR NEGATIVE 11/10/2014 1336   BILIRUBINUR SMALL* 11/10/2014 1336   KETONESUR 15* 11/10/2014 1336   PROTEINUR 100* 11/10/2014 1336   UROBILINOGEN 1.0 11/10/2014 1336  NITRITE NEGATIVE 11/10/2014 1336   LEUKOCYTESUR NEGATIVE 11/10/2014 1336   Urine Drug Screen     Component Value Date/Time   LABOPIA NONE DETECTED 11/10/2014 1336   COCAINSCRNUR NONE DETECTED 11/10/2014 1336   LABBENZ POSITIVE* 11/10/2014 1336   AMPHETMU NONE DETECTED 11/10/2014 1336   THCU POSITIVE* 11/10/2014 1336   LABBARB NONE DETECTED 11/10/2014 1336    Alcohol Level    Component Value Date/Time   ETH <5 11/10/2014 1220     SIGNIFICANT DIAGNOSTIC STUDIES Ct Head Wo Contrast 11/10/2014 No evidence for post t-PA hemorrhage. Early cytotoxic edema now noted in the LEFT hemisphere.  11/10/2014 1. No acute intracranial pathology.   Ct Angio Head & Neck W/cm &/or Wo/cm 11/10/2014  1. 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  2. No proximal large vessel occlusion.  3. Moderate irregular narrowing and attenuation of ACA branch vessels, progressed from prior MRA.  4. Mild-to-moderate plaque in the cervical carotid arteries without stenosis.  5. Mild proximal left vertebral artery stenosis.   MRI Brain without contrast 11/12/2014 1. Moderate size left MCA territory infarct affecting the left insula and operculum. No associated hemorrhage or mass effect. 2. Expected evolution of the posterior right ACA and posterior left MCA infarcts seen in March.  Portable Chest Xray 11/24/2014 Partial improvement of the left lower lobe airspace consolidation. Enlarged cardiac  silhouette. 11/21/2014 No change from the prior exam. Persistent left lower lobe consolidation is noted.  11/11/2014 1. Lines and tubes in stable position. 2. New onset of dense left lower lobe atelectasis and consolidation. Small left pleural effusion.  11/10/2014 Endotracheal tube terminates 3 cm above the carina.   EEG  mild global cerebral dysfunction as seen in a wide variety of toxic metabolic encephalopathies and degenerative brain disorders. No electrographic seizures noted.  2D-Echo 11/12/14 - Left ventricle: The cavity size was moderately reduced. Wallthickness was increased in a pattern of severe LVH. Systolicfunction was normal. The estimated ejection fraction was in therange of 60% to 65%. Wall motion was normal; there were noregional wall motion abnormalities. - Right ventricle: The cavity size was mildly dilated. Systolicfunction was mildly reduced. - Right atrium: The atrium was severely dilated. - Tricuspid valve: There was severe regurgitation. - Pulmonary arteries: Systolic pressure was severely increased. PApeak pressure: 86 mm Hg (S). - Pericardium, extracardiac: A small pericardial effusion wasidentified circumferential to the heart. There was no evidence ofhemodynamic compromise.     HISTORY OF PRESENT ILLNES [name] is an 59 y.o. female who was at dialysis and finished her session. After dialysis while still at the center she was noted to have sudden onset right sided weakness, left gaze deviation (LKW 11/10/2014 at 1106). EMS was called and on site BG was noted to be 50. Patient was given D50 and was transported to [facility] ED. On arrival she had labored respirations was not protecting her airway, and required intubation. CT of head was delayed due to need for intubation. CT head obtained and showed no acte stroke or bleed. Patient was deemed to be a candidate for TPA and was brought back to the ED. Dialysis needles were still present in the patient's AV  fistula. IV team was consulted and removed the hemodialysis needles as well as achieved hemostasis. IV tPA was then administered. CT angiogram of the head and neck was obtained which showed no large vessel proximal occlusion. She was subsequently admitted to the neuro intensive care unit for further management.     HOSPITAL COURSE Ms.  [name] is a 59 y.o. female with history of HTN, ESRD on HD, SVT, PFO, previous stroke, COPD, AAA, and recent bowel ischemia leading to resection admitted for right sided weakness, left gaze deviation. She received IV t-PA 11/10/2014 at 1340.   Stroke: Dominant left MCA infarct s/p IV tPA, embolic secondary to known atrial fibrillation   Resultant Global aphasia, right hemiparesis  MRI Moderate size left MCA territory infarct.  CTA head & neck L M2 branch occlusion.  2D Echo EF 60-65%   TEE 06/23/2014 PFO. Severe RV dysfunction, RAE, TR. Incessant atrial tachycardia  LDL 79  HgbA1c 4.8  SCDs for VTE prophylaxis (listed heparin antibody positive, not able to do arixtra due to ESRD, however, pt was given heparin for HD)  aspirin 81 mg orally every day prior to admission, now on ASA 325mg  daily.   Ongoing aggressive stroke risk factor management  Therapy recommendations: CIR.   Disposition: CIR vs SNF (active with [facility] RN, PT and OT PTA, daughter does not prefer SNF at discharge; her goal is to take her mother home.)  Paroxysmal Atrial Fibrillation  Documented on tele 07/27/2014  CHADS-VASc is 4 (stroke, HTN, Female).   Decided not an anticoagulation candidate 07/2014 as risk outweighs benefits given multiple medical issues (see note 07/28/2014 from Dr. Anne Fu -"Ultimately deserves anticoagulation (coumadin). Given her multitude of co morbidities (including fistula) she would not be an optimal candidate for anticoagulation. Risks outweigh benefits. Family understands risk of future stroke/ thrombus.")  May reconsider anticoagulation with  coumadin if/when pt medical condition improves  Previous stroke  Prolonged hospitalization from 06/17/14 to 09/11/14  Right hemisphere acute infarcts involving right ACA, left PCA, and left MCA, cardioembolic pattern, pt w/ PAF  Respiratory distress  Intubated in ED; Extubated 11/20/14  Copious secretions  Treated with chest PT Q4, NT suction and mucomyst nebs Q6  Has albuterol PRN  Has atrovent and Xopenex Q6  Had some respiratory compromise yesterday that started during HD - pt de-satted with bilateral crackles - resolved after aggressive suctioning, breathing treatment and NRB face mask. Respiratory stable am of discharge. Daughter feels it has to do with anxiety of HD. At home she takes xanax prior to HD. This has been resumed.  Code status DNR  Dysphagia  Secondary to stroke  PEG placed  Tube feedings tolerated  Possible seizure  Intermittent, arrhythmic twitching of R hand  Started on Keppra, 1 gm load followed by 500 mg bid  EEG negative for seizure  AcceleratedHypertension   BP 189/118 on arrival  Resolved  Not on BP meds  Hyperlipidemia  Home meds: No statin  LDL 79, goal < 70  On lipitor 10mg  daily  Continue statin on discharge  Hypoglycemia  Treatment required during hospitalization  HgbA1c 4.8, at goal < 7.0  Resolved  Other Stroke Risk Factors  Cigarette smoker  UDS positive for benzos and THC  Obesity, Body mass index is 30.82 kg/(m^2).   Known PFO  Other Active Problems  ESRD on HD T, TH, S. Unless improves, not a candidate for OP HD per renal  AAA 3 cm by CT 08/2014  Hypokalemia - on supplement  Anxiety, xanax resumed day of discharge  Other Pertinent History  Recent admission for bowel ischemia and resection [name] 2016; palliative care heavily involved.  Made DNR this admission - family has not been receptive to palliative care in the past nor are they interested in pursuing   DISCHARGE EXAM Blood pressure  117/82, pulse 103,  temperature 98.7 F (37.1 C), temperature source Oral, resp. rate 24, height 5' 7.01" (1.702 m), weight 87.2 kg (192 lb 3.9 oz), SpO2 95 %. General - obese, well developed, on nasal trumpet, respiratory distress much improved from yesterday. Ophthalmologic - Fundi not visualized due to noncooperation. Cardiovascular - Regular rate and rhythm. Lung - bilateral coarse rhonchi and wheezing sound. Neuro - On right nasal trumpet, mild respiratory distress. Awake and open eyes spontaneously, but still not following commands. PERRL, able to track objects intermittently, but still left gaze preference but able to cross midline intermittently, doll's eye present, corneal, gag and cough present. RUE 0/5 proximal but 3/5 distally, LUE localize to pain with spontaneous movement and against gravity, LLE 2/5 on pain, and RLE 2/5 on pain, b/l unsustained ankle clonus. Increased muscle tone RUE and BLE. Babinski mute bilaterally. Sensory, coordination and gait not tested.   Discharge Diet  Diet NPO time specified liquids  DISCHARGE PLAN  Disposition:  Transfer to [facility] for ongoing PT, OT and ST  aspirin 325 mg orally every day for secondary stroke prevention.  Recommend ongoing risk factor control by Primary Care Physician at time of discharge from inpatient rehabilitation.  Follow-up [name], [name], MD in 2 weeks following discharge from rehab.  Follow-up with Dr. Delia Heady, Stroke Clinic in 2 months.   45 minutes were spent preparing discharge.  Rhoderick Moody [facility] See Amion for Pager information 11/25/2014 12:27 PM  I have personally examined this patient, reviewed notes, independently viewed imaging studies, participated in medical decision making and plan of care. I have made any additions or clarifications directly to the above note. Agree with note above. I had a long discussion with the patient's daughter at the bedside and answered  questions.  Delia Heady, MD Medical Director [facility] Pager: [phone] 11/25/2014 12:29 PM

## 2014-11-24 NOTE — Progress Notes (Signed)
  [facility] Progress Note   Subjective: follows simple commands nonverbal  Filed Vitals:   11/24/14 1048 11/24/14 1100 11/24/14 1130 11/24/14 1230  BP: 97/63 113/63 85/61 135/85  Pulse: 102 105 105 108  Temp:      TempSrc:      Resp: Height:      Weight:      SpO2: 94% 93% 94% 96%   Exam: Moves head to voice Chest bilat basilar rhonchi, R IJ cath RRR 2/6 holosyst M Abd +scars, PEG, +BS LUA AVF  Neuro wiggles R foot/ L foot, grips L hand slightly   TTS AF   4h  91kg  2/2 bath Hep none Hect 2 ug Aranesp 200 /wk + venofer 50/wk3      Assessment: 1. ESRD start using AVF this week 2. HPTH cont binder 3. Anemia darbe 100/wk 4. CVA R hemi/ aphasia 5. Nutrition on TF's 6. Dispo - would not be a candidate for outpt HD in current condition  Plan - HD today, minimal UF    [name] MD  pager [phone]    cell [phone]  11/24/2014, 1:01 PM     Recent Labs Lab 11/18/14 0217 11/19/14 0841  11/21/14 0859 11/22/14 0208 11/23/14 0441 11/24/14 0604  NA 140 141  < > 141 138 137 139  K 3.5 5.0  < > 3.8 3.2* 3.1* 4.4  CL 101 102  < > 104 98* 98* 102  CO2 28 27  < > GLUCOSE 99 93  < > 79 97 96 112*  BUN 62* 99*  < > 102* 47* 75* 92*  CREATININE 5.19* 6.89*  < > 7.32* 4.09* 5.88* 7.44*  CALCIUM 8.6* 8.8*  < > 8.9 8.5* 8.5* 8.9  PHOS 3.9 5.8*  --  6.5*  --   --   --   < > = values in this interval not displayed.  Recent Labs Lab 11/19/14 0841 11/21/14 0859  ALBUMIN 2.0* 2.2*    Recent Labs Lab 11/22/14 0208 11/23/14 0441 11/24/14 0604  WBC 5.5 6.2 6.1  HGB 10.5* 10.2* 10.0*  HCT 33.5* 32.1* 31.1*  MCV 88.6 87.2 86.4  PLT 265 295 280   . acetylcysteine  3 mL Nebulization Q6H  . antiseptic oral rinse  7 mL Mouth Rinse QID  . aspirin  325 mg Oral Daily  . budesonide  0.25 mg Nebulization BID  . chlorhexidine gluconate  15 mL Mouth Rinse BID  . darbepoetin (ARANESP) injection - DIALYSIS  100 mcg Intravenous Q  Tue-HD  . guaifenesin  200 mg Per Tube TID  . ipratropium-albuterol  3 mL Nebulization Q6H  . lanthanum  500 mg Per Tube TID  . levETIRAcetam  500 mg Per Tube BID  . levothyroxine  25 mcg Oral QAC breakfast  . multivitamin  1 tablet Oral QHS  . pantoprazole sodium  40 mg Per Tube Q1200   . feeding supplement (NEPRO CARB STEADY) 1,000 mL (11/23/14 1834)   sodium chloride, sodium chloride, acetaminophen **OR** [DISCONTINUED] acetaminophen, alteplase, atropine, feeding supplement (NEPRO CARB STEADY), heparin, iohexol, ipratropium-albuterol, lidocaine (PF), lidocaine-prilocaine, pentafluoroprop-tetrafluoroeth, senna-docusate

## 2014-11-24 NOTE — Progress Notes (Signed)
Rehab admissions - I am holding off on admission to inpatient rehab today.  Patient with shortness of breath and now on 02 at 6L.  I have let Dr. Lendell Caprice know and I let Annie Main know that I am holding off on admission until patient is medically stable.  Will check back tomorrow.  #[phone]

## 2014-11-24 NOTE — Progress Notes (Signed)
[facility] PROGRESS NOTE  Ramiah C Mckinney UJW:[id number] DOB: Apr 16, 1955 DOA: 11/10/2014 PCP: [name], MD  D/w Dr. Hermelinda Medicus about tachypnea in dialysis. Called to bedside by RN for respiratory distress  Assessment/Plan: Primary problem: Acute respiratory failure with tachypnea and accessory muscle usage: Chest x-ray actually shows improvement. Initially, lungs quite tight with poor air movement. After breathing treatment, better air movement but still with rhonchi and respiratory distress. Patient is DO NOT RESUSCITATE. We'll try a dose of Solu-Medrol. Discussed with Dr. Pearlean Brownie, as well as Dr. Molli Knock and Kreg Shropshire, NP. Per critical care discussions, family understands patient is not a candidate for BiPAP due to her inability to control her secretions. Will keep on the floor and continue pulmonary toilet, bronchodilators, etc. Will give morphine as needed for respiratory distress. Discussed guarded prognosis with patient's daughter voices understanding.  Patient is not stable to go to rehabilitation at this time.  Active Problems:   CVA (cerebral infarction)   Acute respiratory failure with hypoxemia   PFO (patent foramen ovale)   Essential hypertension   Paroxysmal a-fib   HLD (hyperlipidemia)   ESRD on dialysis   History of stroke   DNAR (do not attempt resuscitation)   PEG (percutaneous endoscopic gastrostomy) status   Respiratory distress   Cerebral infarction due to embolism of left middle cerebral artery  HPI/Subjective: Nurse reports patient started having respiratory distress and dialysis.   Objective: Filed Vitals:   11/24/14 1413  BP: 125/83  Pulse: 110  Temp: 98 F (36.7 C)  Resp: 35    Intake/Output Summary (Last 24 hours) at 11/24/14 1536 Last data filed at 11/24/14 1413  Gross per 24 hour  Intake      0 ml  Output    535 ml  Net   -535 ml   Filed Weights   11/24/14 0706 11/24/14 1043 11/24/14 1413  Weight: 89.268 kg (196 lb 12.8 oz) 87.7 kg (193 lb  5.5 oz) 87.2 kg (192 lb 3.9 oz)    Exam:   General:  Alert. Nonverbal. moderate respiratory distress with accessory muscle usage and labored breathing, tachypnea.   Cardiovascular: RRR  Respiratory: initially with prolonged expiratory phase and expiratory wheeze. After breathing treatment, mainly rhonchi., And slightly less labored breathing.   Abdomen: S, NT, ND  Ext: no CCE  Basic Metabolic Panel:  Recent Labs Lab 11/18/14 0217 11/19/14 0841 11/20/14 0243 11/21/14 0859 11/22/14 0208 11/23/14 0441 11/24/14 0604  NA 140 141 141 141 138 137 139  K 3.5 5.0 3.4* 3.8 3.2* 3.1* 4.4  CL 101 102 102 104 98* 98* 102  CO2 28 27 27 22 27 26 25   GLUCOSE 99 93 97 79 97 96 112*  BUN 62* 99* 70* 102* 47* 75* 92*  CREATININE 5.19* 6.89* 5.79* 7.32* 4.09* 5.88* 7.44*  CALCIUM 8.6* 8.8* 8.8* 8.9 8.5* 8.5* 8.9  MG 1.9  --   --   --   --   --   --   PHOS 3.9 5.8*  --  6.5*  --   --   --    Liver Function Tests:  Recent Labs Lab 11/19/14 0841 11/21/14 0859  ALBUMIN 2.0* 2.2*   No results for input(s): LIPASE, AMYLASE in the last 168 hours. No results for input(s): AMMONIA in the last 168 hours. CBC:  Recent Labs Lab 11/20/14 0243 11/21/14 0900 11/22/14 0208 11/23/14 0441 11/24/14 0604  WBC 6.3 5.4 5.5 6.2 6.1  HGB 9.5* 10.3* 10.5* 10.2* 10.0*  HCT 30.5*  33.1* 33.5* 32.1* 31.1*  MCV 88.7 88.7 88.6 87.2 86.4  PLT 269 267 265 295 280   Cardiac Enzymes: No results for input(s): CKTOTAL, CKMB, CKMBINDEX, TROPONINI in the last 168 hours. BNP (last 3 results)  Recent Labs  06/17/14 2043  BNP 1378.8*    ProBNP (last 3 results) No results for input(s): PROBNP in the last 8760 hours.  CBG:  Recent Labs Lab 11/23/14 1109 11/23/14 1631 11/23/14 2026 11/24/14 0023 11/24/14 0416  GLUCAP 107* 86 84 103* 113*    No results found for this or any previous visit (from the past 240 hour(s)).   Studies: Dg Chest Port 1 View  11/24/2014   CLINICAL DATA:  Respiratory  distress for 1 day.  EXAM: PORTABLE CHEST - 1 VIEW  COMPARISON:  11/21/2014  FINDINGS: Dual lumen central venous catheter is stable in appearance.  Cardiomediastinal silhouette is stably enlarged. Mediastinal contours appear intact.  There is interval improvement in the previously noted left lower lobe airspace consolidation. There is no evidence of pleural effusion or pneumothorax.  Osseous structures are without acute abnormality. Soft tissues are grossly normal.  IMPRESSION: Partial improvement of the left lower lobe airspace consolidation.  Enlarged cardiac silhouette.   Electronically Signed   By: Ted Mcalpine M.D.   On: 11/24/2014 15:24    Scheduled Meds: . acetylcysteine  3 mL Nebulization Q6H  . antiseptic oral rinse  7 mL Mouth Rinse QID  . aspirin  325 mg Oral Daily  . budesonide  0.25 mg Nebulization BID  . chlorhexidine gluconate  15 mL Mouth Rinse BID  . darbepoetin (ARANESP) injection - DIALYSIS  100 mcg Intravenous Q Tue-HD  . guaifenesin  200 mg Per Tube TID  . ipratropium-albuterol  3 mL Nebulization Q6H  . lanthanum  500 mg Per Tube TID  . levETIRAcetam  500 mg Per Tube BID  . levothyroxine  25 mcg Oral QAC breakfast  . methylPREDNISolone (SOLU-MEDROL) injection  125 mg Intravenous Once  . multivitamin  1 tablet Oral QHS  . pantoprazole sodium  40 mg Per Tube Q1200   Continuous Infusions: . feeding supplement (NEPRO CARB STEADY) 1,000 mL (11/23/14 1834)    Cc time 35 min  Mareo Portilla L  [facility] [email or web address], password [facility] 11/24/2014, 3:36 PM  LOS: 14 days

## 2014-11-25 ENCOUNTER — Inpatient Hospital Stay (HOSPITAL_COMMUNITY)
Admission: RE | Admit: 2014-11-25 | Discharge: 2014-12-24 | DRG: 056 | Disposition: A | Discharge status: Home Health | Payer: Medicaid Other | Source: Intra-hospital | Attending: Physical Medicine & Rehabilitation | Admitting: Physical Medicine & Rehabilitation

## 2014-11-25 DIAGNOSIS — F17210 Nicotine dependence, cigarettes, uncomplicated: Secondary | ICD-10-CM | POA: Diagnosis present

## 2014-11-25 DIAGNOSIS — N186 End stage renal disease: Secondary | ICD-10-CM | POA: Diagnosis present

## 2014-11-25 DIAGNOSIS — J9601 Acute respiratory failure with hypoxia: Secondary | ICD-10-CM | POA: Diagnosis not present

## 2014-11-25 DIAGNOSIS — Q211 Atrial septal defect: Secondary | ICD-10-CM | POA: Diagnosis not present

## 2014-11-25 DIAGNOSIS — I69351 Hemiplegia and hemiparesis following cerebral infarction affecting right dominant side: Principal | ICD-10-CM

## 2014-11-25 DIAGNOSIS — D631 Anemia in chronic kidney disease: Secondary | ICD-10-CM | POA: Diagnosis present

## 2014-11-25 DIAGNOSIS — G4700 Insomnia, unspecified: Secondary | ICD-10-CM | POA: Diagnosis present

## 2014-11-25 DIAGNOSIS — N2581 Secondary hyperparathyroidism of renal origin: Secondary | ICD-10-CM | POA: Diagnosis present

## 2014-11-25 DIAGNOSIS — E162 Hypoglycemia, unspecified: Secondary | ICD-10-CM | POA: Diagnosis not present

## 2014-11-25 DIAGNOSIS — Z934 Other artificial openings of gastrointestinal tract status: Secondary | ICD-10-CM | POA: Diagnosis not present

## 2014-11-25 DIAGNOSIS — F419 Anxiety disorder, unspecified: Secondary | ICD-10-CM | POA: Diagnosis present

## 2014-11-25 DIAGNOSIS — R1310 Dysphagia, unspecified: Secondary | ICD-10-CM | POA: Diagnosis present

## 2014-11-25 DIAGNOSIS — I69390 Apraxia following cerebral infarction: Secondary | ICD-10-CM | POA: Diagnosis not present

## 2014-11-25 DIAGNOSIS — I120 Hypertensive chronic kidney disease with stage 5 chronic kidney disease or end stage renal disease: Secondary | ICD-10-CM | POA: Diagnosis present

## 2014-11-25 DIAGNOSIS — I69391 Dysphagia following cerebral infarction: Secondary | ICD-10-CM | POA: Diagnosis not present

## 2014-11-25 DIAGNOSIS — Z992 Dependence on renal dialysis: Secondary | ICD-10-CM | POA: Diagnosis not present

## 2014-11-25 DIAGNOSIS — G8191 Hemiplegia, unspecified affecting right dominant side: Secondary | ICD-10-CM

## 2014-11-25 DIAGNOSIS — K5900 Constipation, unspecified: Secondary | ICD-10-CM | POA: Diagnosis present

## 2014-11-25 DIAGNOSIS — Z66 Do not resuscitate: Secondary | ICD-10-CM | POA: Diagnosis present

## 2014-11-25 DIAGNOSIS — J45909 Unspecified asthma, uncomplicated: Secondary | ICD-10-CM | POA: Diagnosis present

## 2014-11-25 DIAGNOSIS — I69320 Aphasia following cerebral infarction: Secondary | ICD-10-CM

## 2014-11-25 DIAGNOSIS — E039 Hypothyroidism, unspecified: Secondary | ICD-10-CM | POA: Diagnosis present

## 2014-11-25 DIAGNOSIS — J449 Chronic obstructive pulmonary disease, unspecified: Secondary | ICD-10-CM | POA: Diagnosis present

## 2014-11-25 DIAGNOSIS — F329 Major depressive disorder, single episode, unspecified: Secondary | ICD-10-CM | POA: Diagnosis present

## 2014-11-25 DIAGNOSIS — I63512 Cerebral infarction due to unspecified occlusion or stenosis of left middle cerebral artery: Secondary | ICD-10-CM | POA: Diagnosis present

## 2014-11-25 DIAGNOSIS — G8190 Hemiplegia, unspecified affecting unspecified side: Secondary | ICD-10-CM

## 2014-11-25 DIAGNOSIS — R0902 Hypoxemia: Secondary | ICD-10-CM

## 2014-11-25 DIAGNOSIS — R0682 Tachypnea, not elsewhere classified: Secondary | ICD-10-CM

## 2014-11-25 DIAGNOSIS — IMO0002 Reserved for concepts with insufficient information to code with codable children: Secondary | ICD-10-CM

## 2014-11-25 LAB — GLUCOSE, CAPILLARY
GLUCOSE-CAPILLARY: 109 mg/dL — AB (ref 65–99)
GLUCOSE-CAPILLARY: 128 mg/dL — AB (ref 65–99)
GLUCOSE-CAPILLARY: 75 mg/dL (ref 65–99)
Glucose-Capillary: 100 mg/dL — ABNORMAL HIGH (ref 65–99)

## 2014-11-25 MED ORDER — NEPRO/CARBSTEADY PO LIQD
1000.0000 mL | ORAL | Status: DC
  Administered 2014-11-25: 1000 mL via ORAL
  Filled 2014-11-25 (×3): qty 1000

## 2014-11-25 MED ORDER — LEVETIRACETAM 100 MG/ML PO SOLN
500.0000 mg | Freq: Two times a day (BID) | ORAL | Status: DC
  Administered 2014-11-25 – 2014-11-26 (×2): 500 mg
  Filled 2014-11-25 (×3): qty 5

## 2014-11-25 MED ORDER — HEPARIN SODIUM (PORCINE) 1000 UNIT/ML DIALYSIS
1000.0000 [IU] | INTRAMUSCULAR | Status: DC | PRN
  Filled 2014-11-25: qty 1

## 2014-11-25 MED ORDER — ACETAMINOPHEN 325 MG PO TABS: 650.0000 mg | ORAL_TABLET | ORAL | Status: DC | PRN

## 2014-11-25 MED ORDER — PENTAFLUOROPROP-TETRAFLUOROETH EX AERO
1.0000 | INHALATION_SPRAY | CUTANEOUS | Status: DC | PRN
Start: 2014-11-25 — End: 2014-11-27

## 2014-11-25 MED ORDER — LANTHANUM CARBONATE 500 MG PO CHEW
500.0000 mg | CHEWABLE_TABLET | Freq: Three times a day (TID) | ORAL | Status: DC
  Administered 2014-11-25 – 2014-12-13 (×49): 500 mg
  Filled 2014-11-25 (×58): qty 1

## 2014-11-25 MED ORDER — ACETYLCYSTEINE 20 % IN SOLN
3.0000 mL | Freq: Four times a day (QID) | RESPIRATORY_TRACT | Status: DC
  Administered 2014-11-25: 3 mL via RESPIRATORY_TRACT
  Administered 2014-11-26: 4 mL via RESPIRATORY_TRACT
  Filled 2014-11-25 (×7): qty 4

## 2014-11-25 MED ORDER — ONDANSETRON HCL 4 MG PO TABS: 4.0000 mg | ORAL_TABLET | Freq: Four times a day (QID) | ORAL | Status: DC | PRN

## 2014-11-25 MED ORDER — SORBITOL 70 % SOLN
30.0000 mL | Freq: Every day | Status: DC | PRN
Start: 2014-11-25 — End: 2014-12-24

## 2014-11-25 MED ORDER — ASPIRIN 325 MG PO TABS
325.0000 mg | ORAL_TABLET | Freq: Every day | ORAL | Status: DC
Start: 2014-11-25 — End: 2014-12-24
  Administered 2014-11-25 – 2014-12-24 (×30): 325 mg via ORAL
  Filled 2014-11-25 (×31): qty 1

## 2014-11-25 MED ORDER — BUDESONIDE 0.25 MG/2ML IN SUSP
0.2500 mg | Freq: Two times a day (BID) | RESPIRATORY_TRACT | Status: DC
  Administered 2014-11-25 – 2014-12-23 (×49): 0.25 mg via RESPIRATORY_TRACT
  Filled 2014-11-25 (×58): qty 2

## 2014-11-25 MED ORDER — PANTOPRAZOLE SODIUM 40 MG PO PACK
40.0000 mg | PACK | Freq: Every day | ORAL | Status: DC
  Administered 2014-11-27 – 2014-12-24 (×28): 40 mg
  Filled 2014-11-25 (×31): qty 20

## 2014-11-25 MED ORDER — ALPRAZOLAM 0.25 MG PO TABS: 0.2500 mg | ORAL_TABLET | ORAL | Status: DC

## 2014-11-25 MED ORDER — GUAIFENESIN 100 MG/5ML PO SYRP
200.0000 mg | ORAL_SOLUTION | Freq: Three times a day (TID) | ORAL | Status: DC
  Administered 2014-11-25 – 2014-12-20 (×69): 200 mg
  Filled 2014-11-25 (×82): qty 10

## 2014-11-25 MED ORDER — ALPRAZOLAM 0.5 MG PO TABS: 0.5000 mg | ORAL_TABLET | ORAL | Status: DC

## 2014-11-25 MED ORDER — ONDANSETRON HCL 4 MG/2ML IJ SOLN: 4.0000 mg | Freq: Four times a day (QID) | INTRAMUSCULAR | Status: DC | PRN

## 2014-11-25 MED ORDER — CHLORHEXIDINE GLUCONATE 0.12% ORAL RINSE (MEDLINE KIT)
15.0000 mL | Freq: Two times a day (BID) | OROMUCOSAL | Status: DC
  Administered 2014-11-27 (×2): 15 mL via OROMUCOSAL

## 2014-11-25 MED ORDER — IPRATROPIUM-ALBUTEROL 0.5-2.5 (3) MG/3ML IN SOLN
3.0000 mL | Freq: Four times a day (QID) | RESPIRATORY_TRACT | Status: DC
  Administered 2014-11-25 – 2014-12-08 (×47): 3 mL via RESPIRATORY_TRACT
  Filled 2014-11-25 (×9): qty 3
  Filled 2014-11-25: qty 9
  Filled 2014-11-25 (×40): qty 3

## 2014-11-25 MED ORDER — ACETAMINOPHEN 325 MG PO TABS
325.0000 mg | ORAL_TABLET | ORAL | Status: DC | PRN
  Administered 2014-11-25 – 2014-12-01 (×5): 650 mg
  Administered 2014-12-02: 325 mg
  Administered 2014-12-03 – 2014-12-12 (×12): 650 mg
  Administered 2014-12-13: 325 mg
  Administered 2014-12-14 – 2014-12-23 (×7): 650 mg
  Filled 2014-11-25 (×7): qty 2
  Filled 2014-11-25: qty 1
  Filled 2014-11-25 (×22): qty 2

## 2014-11-25 MED ORDER — ATROPINE SULFATE 1 % OP SOLN
4.0000 [drp] | OPHTHALMIC | Status: DC | PRN
  Filled 2014-11-25: qty 2

## 2014-11-25 MED ORDER — RENA-VITE PO TABS
1.0000 | ORAL_TABLET | Freq: Every day | ORAL | Status: DC
  Administered 2014-11-25 – 2014-12-23 (×29): 1 via ORAL
  Filled 2014-11-25 (×29): qty 1

## 2014-11-25 MED ORDER — LEVOTHYROXINE SODIUM 25 MCG PO TABS
25.0000 ug | ORAL_TABLET | Freq: Every day | ORAL | Status: DC
  Administered 2014-11-26 – 2014-12-24 (×29): 25 ug via ORAL
  Filled 2014-11-25 (×30): qty 1

## 2014-11-25 MED ORDER — IPRATROPIUM-ALBUTEROL 0.5-2.5 (3) MG/3ML IN SOLN
3.0000 mL | RESPIRATORY_TRACT | Status: DC | PRN
  Administered 2014-11-26 (×2): 3 mL via RESPIRATORY_TRACT

## 2014-11-25 MED ORDER — ALPRAZOLAM 0.5 MG PO TABS
0.5000 mg | ORAL_TABLET | ORAL | Status: DC
  Administered 2014-11-26 – 2014-12-07 (×5): 0.5 mg
  Filled 2014-11-25 (×6): qty 1

## 2014-11-25 MED ORDER — ANTISEPTIC ORAL RINSE SOLUTION (CORINZ)
7.0000 mL | Freq: Four times a day (QID) | OROMUCOSAL | Status: DC
  Administered 2014-11-26 – 2014-12-06 (×24): 7 mL via OROMUCOSAL

## 2014-11-25 MED ORDER — DARBEPOETIN ALFA 100 MCG/0.5ML IJ SOSY
100.0000 ug | PREFILLED_SYRINGE | INTRAMUSCULAR | Status: DC
  Filled 2014-11-25: qty 0.5

## 2014-11-25 NOTE — H&P (Addendum)
Physical Medicine and Rehabilitation Admission H&P   Chief Complaint  Patient presents with  . Code Stroke  : HPI: [name] is a 59 y.o. 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 Interventional radiology, received inpatient rehabilitation services maintain on aspirin 81 mg for CVA prophylaxis. She was discharged to home with family ambulating short household distances using a rolling walker and minimal assist to contact-guard. Presented 11/10/2014 with right-sided weakness and left gaze deviation. Patient did require intubation and mechanical ventilation for acute respiratory failure and extubated 11/20/2014. MRI of the brain showed moderate size left MCA territory infarct affecting the left insula and operculum. Expected evolution of posterior right ACA and posterior left MCA infarcts seen in March. Patient did receive TPA. Echocardiogram with ejection fraction of 65% no wall motion abnormalities. EEG consistent with toxic metabolic encephalopathy and no seizure activity noted as she continued on Keppra for seizure prophylaxis. CT angiogram head and neck shows no large vessel proximal occlusion. Neurology consulted aspirin increased to 325 mg daily. Hemodialysis ongoing as per renal services. Patient currently nothing by mouth with PEG tube feeds for nutritional support. Noted patient with respiratory distress tachypnea and increased anxiety while in dialysis 11/24/2014. Oxygen saturation is 88 and 92% . Chest x-ray completed showing partial improvement of recent left lower lobe airspace consolidation. Patient did receive Solu-Medrol intravenously as well as adjustments in chronic nebulizer treatments for history of asthma. Morphine IV was given due to her  respiratory distress. Patient improved greatly after suctioning and pulmonary toileting. Her scheduled Xanax that she received on dialysis days was resumed. Physical and occupational therapy evaluations completed with recommendations of physical medicine rehabilitation consult. Patient was admitted for comprehensive rehabilitation program  ROS Review of Systems  Constitutional: Negative for fever and chills.  Eyes: Positive for blurred vision.  Respiratory: Positive for cough.   Shortness of breath with exertion  Cardiovascular: Positive for palpitations and leg swelling.  Gastrointestinal: Positive for nausea and constipation.  Skin: Negative for rash.  Neurological: Positive for dizziness and weakness. Negative for headaches.   Dysphagia  Psychiatric/Behavioral: Positive for depression. The patient has insomnia.   Anxiety    Past Medical History  Diagnosis Date  . Asthma   . Hypertension   . Gout   . Arthritis   . Insomnia   . Chronic kidney disease   . Depression   . Renal insufficiency   . Aortic aneurysm without rupture     3cm by CT 08/03/14  . Paroxysmal SVT (supraventricular tachycardia)   . Cor pulmonale   . Stroke 06/2014  . PFO (patent foramen ovale)   . COPD (chronic obstructive pulmonary disease)    Past Surgical History  Procedure Laterality Date  . Multiple tooth extractions    . Av fistula placement Left 01/28/2014    Procedure: ARTERIOVENOUS (AV) FISTULA CREATION; Surgeon: Elam Dutch, MD; Location: [place]; Service: Vascular; Laterality: Left;  . Tee without cardioversion N/A 06/23/2014    Procedure: TRANSESOPHAGEAL ECHOCARDIOGRAM (TEE); Surgeon: Lelon Perla, MD; Location: [place]; Service: Cardiovascular; Laterality: N/A;  . Insertion of dialysis catheter Right 06/27/2014    Procedure: INSERTION OF Right Internal Jugular DIATEK CATHETER;  Surgeon: Mal Misty, MD; Location: [place]; Service: Vascular; Laterality: Right;  . Laparotomy N/A 07/01/2014    Procedure: EXPLORATORY LAPAROTOMY WITH  CLOSURE OF PERFORATED PYLORIC ULCER; Surgeon: Fanny Skates, MD; Location: [place]; Service: General; Laterality: N/A;  . Bowel resection N/A 07/01/2014    Procedure: SMALL BOWEL RESECTION; Surgeon: Fanny Skates, MD; Location: [place]; Service: General; Laterality: N/A;  . Revison of arteriovenous fistula Left 09/09/2014    Procedure: LIGATION OF COMPETING BRANCH, & RESECTION OF VENOUS ANEURYSM OF LEFT UPPER ARM ARTERIOVENOUS FISTULA; Surgeon: Rosetta Posner, MD; Location: [facility] OR; Service: Vascular; Laterality: Left;   Family History  Problem Relation Age of Onset  . Diabetes Mother   . Hypertension Mother   . Heart disease Mother   . Heart attack Mother   . Peripheral vascular disease Mother   . Diabetes Father   . Heart disease Father   . Hypertension Father   . Diabetes Sister   . Hypertension Sister   . Heart disease Sister     before age 57  . Heart attack Sister   . Peripheral vascular disease Sister   . Heart disease Brother   . Hyperlipidemia Daughter    Social History:  reports that she has been smoking Cigarettes. She has a 10 pack-year smoking history. She has never used smokeless tobacco. She reports that she drinks alcohol. She reports that she does not use illicit drugs. Allergies:  Allergies  Allergen Reactions  . Heparin Other (See Comments)    Heparin antibody positive; SRA negative   Medications Prior to Admission  Medication Sig Dispense Refill  . ALPRAZolam (XANAX) 0.5 MG tablet Take 1 tablet (0.5 mg total) by mouth Every Tuesday,Thursday,and Saturday with dialysis. 30 tablet 0  . amiodarone (PACERONE) 200 MG tablet 2 tablets daily until 10/05/2014 then 1 tablet daily (Patient taking  differently: Take 200 mg by mouth daily. ) 60 tablet 1  . aspirin 81 MG chewable tablet Chew 1 tablet (81 mg total) by mouth daily.    . calcitRIOL (ROCALTROL) 0.5 MCG capsule Take 1 capsule (0.5 mcg total) by mouth every Tuesday, Thursday, and Saturday at 6 PM. 30 capsule 3  . citalopram (CELEXA) 20 MG tablet Take 1 tablet (20 mg total) by mouth daily. 30 tablet 1  . levothyroxine (SYNTHROID, LEVOTHROID) 25 MCG tablet Take 1 tablet (25 mcg total) by mouth daily before breakfast. (Patient taking differently: Take 25 mcg by mouth every other day. ) 30 tablet 1  . midodrine (PROAMATINE) 10 MG tablet Take 1 tablet (10 mg total) by mouth 2 (two) times daily with a meal. (Patient taking differently: Take 10 mg by mouth daily. ) 60 tablet 0  . montelukast (SINGULAIR) 10 MG tablet Take 1 tablet (10 mg total) by mouth at bedtime. 30 tablet 3  . multivitamin (RENA-VIT) TABS tablet Take 1 tablet by mouth at bedtime. 30 tablet 0  . Nutritional Supplements (FEEDING SUPPLEMENT, JEVITY 1.2 CAL,) LIQD Place 1,000 mLs into feeding tube continuous. 1000 mL 0  . oxyCODONE (ROXICODONE) 5 MG immediate release tablet Take 1 tablet (5 mg total) by mouth every 6 (six) hours as needed for severe pain. 60 tablet 0  . sevelamer carbonate (RENVELA) 800 MG tablet Take 800 mg by mouth 3 (three) times daily with meals.    . vitamin C (VITAMIN C) 250 MG tablet Take 1 tablet (250 mg total) by mouth daily. 30 tablet 1  . budesonide (PULMICORT) 0.25 MG/2ML nebulizer solution Take 2 mLs (0.25 mg total) by nebulization 2 (two) times daily. (Patient not taking: Reported on 11/11/2014) 60 mL 12  . collagenase (SANTYL) ointment Apply topically daily.  As directed 15 g 0  . ipratropium (ATROVENT) 0.02 % nebulizer solution Take 2.5 mLs (0.5 mg total) by nebulization 3 (three) times daily. (Patient not taking: Reported on 11/11/2014) 75 mL 12  . levalbuterol (XOPENEX) 0.63  MG/3ML nebulizer solution Take 3 mLs (0.63 mg total) by nebulization 3 (three) times daily. (Patient not taking: Reported on 11/11/2014) 3 mL 12    Home: Home Living Family/patient expects to be discharged to:: Unsure Additional Comments: No family available to ask home living questions. Unsure of support post discharge.   Functional History: Prior Function Comments: According to chart 2 months ago, family was assisting with meals and ensuring pt take her proper medication & bill managment.  Functional Status:  Mobility: Bed Mobility Overal bed mobility: Needs Assistance Bed Mobility: Supine to Sit, Sit to Supine Supine to sit: +2 for physical assistance, HOB elevated, Max assist Sit to supine: Max assist, +2 for physical assistance General bed mobility comments: Total A of 2 to get to EOB with assist bringing BLEs to EOB, scooting bottom and elevating trunk. Transfers Overall transfer level: (Deferred secondary to inability to follow commands and weakness/fatigue.)      ADL: ADL Overall ADL's : Needs assistance/impaired General ADL Comments: Pt overall total +2 for bed mobility and ADLs due to decreased cognition, decreased functional etremeity movement, decreased trunk control/support  Cognition: Cognition Overall Cognitive Status: Impaired/Different from baseline (difficult to fully assess secondary to aphasia) Arousal/Alertness: Awake/alert Orientation Level: Other (comment) Attention: Focused Focused Attention: Impaired Focused Attention Impairment: Verbal basic, Functional basic Memory: (unable to assess, suspect impaired) Awareness: Impaired Awareness Impairment: Intellectual impairment Problem Solving: Impaired Problem Solving Impairment: Verbal basic, Functional basic Cognition Arousal/Alertness: Awake/alert Behavior During Therapy: Flat affect Overall Cognitive Status: Impaired/Different from baseline (difficult to fully assess secondary to  aphasia) Area of Impairment: Attention, Following commands, Awareness Current Attention Level: Sustained Following Commands: Follows one step commands inconsistently Awareness: Intellectual General Comments: Pt able to attempt a thumbs up at end of session, using right hand after being asked. Pt very inconsistent with following commands, pt did not touch her nose or stick out her tongue with instructional/verbal, tactile, or demonstrational cues.  Difficult to assess due to: Impaired communication (At end of session, therapist asked pt to give her a thumbs up and pt initiated movement of RUE. Otherwise not following simple 1 step commands despite visual/tactile cues. Able to gaze right with auditory cueing (hitting tray table). )  Physical Exam: Blood pressure 122/78, pulse 103, temperature 98.7 F (37.1 C), temperature source Axillary, resp. rate 20, height 5' 7.01" (1.702 m), weight 87.091 kg (192 lb), SpO2 92 %. Physical Exam Eyes:  Pupils reactive to light  Neck: Normal range of motion. Neck supple. No thyromegaly present.  Cardiovascular: Normal rate and regular rhythm.  Respiratory:  Decreased breath sounds at the bases with scattered rhonchi. Breathing unlabored, appears comfortable today.  GI: Soft. Bowel sounds are normal.  PEG tube in place  Neurological: She is alert.  Left gaze deviation. Poor oro-motor control. She did not follow motor or verbal commands. She was nonverbal throughout exam/ spontaneous movement of LUE and LLE with cueing. No voluntary movement of RUE or RLE. Minimal withdrawal to pain provocation on right as well.  Skin: Skin is warm and dry Psych: affect flat    Lab Results Last 48 Hours    Results for orders placed or performed during the hospital encounter of 11/10/14 (from the past 48 hour(s))  Glucose, capillary Status: None  Collection Time: 11/22/14 8:11 AM  Result Value Ref Range   Glucose-Capillary 98 65 - 99 mg/dL    Comment 1 Notify RN    Comment 2 Document in Chart   Glucose, capillary Status: Abnormal   Collection Time: 11/22/14 11:57 AM  Result Value Ref Range   Glucose-Capillary 111 (H) 65 - 99 mg/dL   Comment 1 Notify RN    Comment 2 Document in Chart   Glucose, capillary Status: None   Collection Time: 11/22/14 4:44 PM  Result Value Ref Range   Glucose-Capillary 75 65 - 99 mg/dL  Glucose, capillary Status: None   Collection Time: 11/22/14 8:29 PM  Result Value Ref Range   Glucose-Capillary 73 65 - 99 mg/dL   Comment 1 Notify RN    Comment 2 Document in Chart   Glucose, capillary Status: None   Collection Time: 11/22/14 11:59 PM  Result Value Ref Range   Glucose-Capillary 76 65 - 99 mg/dL   Comment 1 Notify RN    Comment 2 Document in Chart   Glucose, capillary Status: None   Collection Time: 11/23/14 3:55 AM  Result Value Ref Range   Glucose-Capillary 86 65 - 99 mg/dL   Comment 1 Notify RN    Comment 2 Document in Chart   CBC Status: Abnormal   Collection Time: 11/23/14 4:41 AM  Result Value Ref Range   WBC 6.2 4.0 - 10.5 K/uL   RBC 3.68 (L) 3.87 - 5.11 MIL/uL   Hemoglobin 10.2 (L) 12.0 - 15.0 g/dL   HCT 32.1 (L) 36.0 - 46.0 %   MCV 87.2 78.0 - 100.0 fL   MCH 27.7 26.0 - 34.0 pg   MCHC 31.8 30.0 - 36.0 g/dL   RDW 18.5 (H) 11.5 - 15.5 %   Platelets 295 150 - 400 K/uL  Basic metabolic panel Status: Abnormal   Collection Time: 11/23/14 4:41 AM  Result Value Ref Range   Sodium 137 135 - 145 mmol/L   Potassium 3.1 (L) 3.5 - 5.1 mmol/L   Chloride 98 (L) 101 - 111 mmol/L   CO2 26 22 - 32 mmol/L   Glucose, Bld 96 65 - 99 mg/dL   BUN 75 (H) 6 - 20 mg/dL   Creatinine, Ser 5.88 (H) 0.44 - 1.00 mg/dL   Calcium 8.5 (L) 8.9 - 10.3 mg/dL   GFR calc non Af Amer 7 (L) >60 mL/min    GFR calc Af Amer 8 (L) >60 mL/min    Comment: (NOTE) The eGFR has been calculated using the CKD EPI equation. This calculation has not been validated in all clinical situations. eGFR's persistently <60 mL/min signify possible Chronic Kidney Disease.    Anion gap 13 5 - 15  Glucose, capillary Status: None   Collection Time: 11/23/14 7:47 AM  Result Value Ref Range   Glucose-Capillary 91 65 - 99 mg/dL   Comment 1 Notify RN   Glucose, capillary Status: Abnormal   Collection Time: 11/23/14 11:09 AM  Result Value Ref Range   Glucose-Capillary 107 (H) 65 - 99 mg/dL  Glucose, capillary Status: None   Collection Time: 11/23/14 4:31 PM  Result Value Ref Range   Glucose-Capillary 86 65 - 99 mg/dL  Glucose, capillary Status: None   Collection Time: 11/23/14 8:26 PM  Result Value Ref Range   Glucose-Capillary 84 65 - 99 mg/dL   Comment 1 Notify RN    Comment 2 Document in Chart   Glucose, capillary Status: Abnormal   Collection Time: 11/24/14 12:23  AM  Result Value Ref Range   Glucose-Capillary 103 (H) 65 - 99 mg/dL   Comment 1 Notify RN    Comment 2 Document in Chart   Glucose, capillary Status: Abnormal   Collection Time: 11/24/14 4:16 AM  Result Value Ref Range   Glucose-Capillary 113 (H) 65 - 99 mg/dL   Comment 1 Notify RN    Comment 2 Document in Chart       Imaging Results (Last 48 hours)    No results found.       Medical Problem List and Plan: 1. Functional deficits secondary to left MCA infarct/respiratory failure. Extubated 11/20/2014. Continue nebulizer treatments as advised 2. DVT Prophylaxis/Anticoagulation: SCDs. Monitor for any signs of DVT. Check venous dopplers 3. Pain Management: Tylenol as needed 4. Dysphagia. Patient is nothing by mouth. Status post gastrostomy tube 08/12/2014 per interventional radiology for nutritional support.   5. Neuropsych: This patient is not capable of making decisions on her own behalf. 6. Skin/Wound Care: Routine skin checks. PEG site maintenance. Proper turning and nutrition 7. Fluids/Electrolytes/Nutrition: Routine I&O. Adjust TF based on nutritional needs. Follow labs. 8. End-stage renal disease with hemodialysis. Follow-up per renal services. Hemodialysis in PM after therapies to avoid conflict 9. Seizure prophylaxis. Keppra 500 mg twice a day 10. Hypothyroidism. Synthroid 11. Chronic anemia. Aranesp weekly with dialysis 12. History of asthma. Continue nebulizers as needed. Anxiety component also.  13. Mood/anxiety. Continue Xanax on dialysis days   Post Admission Physician Evaluation: 1. Functional deficits secondary to left MCA infarct with respiratory failure. 2. Patient is admitted to receive collaborative, interdisciplinary care between the physiatrist, rehab nursing staff, and therapy team. 3. Patient's level of medical complexity and substantial therapy needs in context of that medical necessity cannot be provided at a lesser intensity of care such as a SNF. 4. Patient has experienced substantial functional loss from his/her baseline which was documented above under the "Functional History" and "Functional Status" headings. Judging by the patient's diagnosis, physical exam, and functional history, the patient has potential for functional progress which will result in measurable gains while on inpatient rehab. These gains will be of substantial and practical use upon discharge in facilitating mobility and self-care at the household level. 5. Physiatrist will provide 24 hour management of medical needs as well as oversight of the therapy plan/treatment and provide guidance as appropriate regarding the interaction of the two. 6. 24 hour rehab nursing will assist with bladder management, bowel management, safety, skin/wound care, disease management, medication administration, pain  management and patient education and help integrate therapy concepts, techniques,education, etc. 7. PT will assess and treat for/with: Lower extremity strength, range of motion, stamina, balance, functional mobility, safety, adaptive techniques and equipment, NMR, cognitive-linguistic affects on functional mobility, family ed. Goals are: min assist. 8. OT will assess and treat for/with: ADL's, functional mobility, safety, upper extremity strength, adaptive techniques and equipment, NMR, cognitive perceptual rx, ego support, family ed, stroke ed. Goals are: min assist+. Therapy may not yet proceed with showering this patient. 9. SLP will assess and treat for/with: cognition, communication, language, swallowing, education. Goals are: min to mod assist. 10. Case Management and Social Worker will assess and treat for psychological issues and discharge planning. 11. Team conference will be held weekly to assess progress toward goals and to determine barriers to discharge. 12. Patient will receive at least 3 hours of therapy per day at least 5 days per week. 13. ELOS: 18-24 days depending upon progress  14. Prognosis: good  Meredith Staggers, MD, [facility] 11/25/2014

## 2014-11-25 NOTE — Progress Notes (Signed)
Stephanie Mage, RN Rehab Admission Coordinator Addendum Physical Medicine and Rehabilitation PMR Pre-admission 11/24/2014 11:53 AM  Related encounter: ED to Hosp-Admission (Current) from 11/10/2014 in [facility]    Expand All Collapse All   PMR Admission Coordinator Pre-Admission Assessment  Patient: [name] is an 59 y.o., female MRN: [id number] DOB: January 24, 1956 Height: 5' 7.01" (170.2 cm) Weight: 87.2 kg (192 lb 3.9 oz)  Insurance Information HMO: PPO: PCP: IPA: 80/20: OTHER:  PRIMARY: Medicaid Policy#: [id number] o Subscriber: Mattisyn Ildefonso CM Name: Phone#: Fax#:  Pre-Cert#: Employer: Not employed Benefits: Phone #: [phone] Name: Automated Eff. Date: 11/24/14 eligible Deduct: Out of Pocket Max: Life Max:  CIR: SNF:  Outpatient: Co-Pay:  Home Health: Co-Pay:  DME: Co-Pay:  Providers:   Medicaid Application Date: Case Manager:  Disability Application Date: Case Worker:   Emergency Contact Information Contact Information    Name Relation Home Work Mobile   Breuer,[name] Daughter [phone]     Jaydn, Fincher   [phone]     Current Medical History  Patient Admitting Diagnosis: L MCA infarct  History of Present Illness: A 59 y.o. 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 Interventional radiology, received inpatient rehabilitation services maintain on aspirin 81 mg for CVA prophylaxis. She was discharged to  home with family ambulating short household distances using a rolling walker and minimal assist to contact-guard. Presented 11/10/2014 with right-sided weakness and left gaze deviation. Patient did require intubation and mechanical ventilation for acute respiratory failure and extubated 11/20/2014. MRI of the brain showed moderate size left MCA territory infarct affecting the left insula and operculum. Expected evolution of posterior right ACA and posterior left MCA infarcts seen in March. Patient did receive TPA. Echocardiogram with ejection fraction of 65% no wall motion abnormalities. EEG consistent with toxic metabolic encephalopathy and no seizure activity noted as she continued on Keppra for seizure prophylaxis. CT angiogram head and neck shows no large vessel proximal occlusion. Neurology consulted aspirin increased to 325 mg daily. Hemodialysis ongoing as per renal services. Patient currently nothing by mouth with PEG tube feeds for nutritional support. Noted patient with respiratory distress tachypnea and increased anxiety while in dialysis 11/24/2014. Oxygen saturation is 88 and 92% . Chest x-ray completed showing partial improvement of recent left lower lobe airspace consolidation. Patient did receive Solu-Medrol intravenously as well as adjustments in chronic nebulizer treatments for history of asthma. Morphine IV was given due to her respiratory distress. Patient improved greatly after suctioning and pulmonary toileting. Her scheduled Xanax that she received on dialysis days was resumed. Physical and occupational therapy evaluations completed with recommendations of physical medicine rehabilitation consult. Patient to be admitted for comprehensive inpatient rehabilitation program.   Total: 22=NIH  Past Medical History  Past Medical History  Diagnosis Date  . Asthma   . Hypertension   . Gout   . Arthritis   . Insomnia   . Chronic kidney disease   . Depression   .  Renal insufficiency   . Aortic aneurysm without rupture     3cm by CT 08/03/14  . Paroxysmal SVT (supraventricular tachycardia)   . Cor pulmonale   . Stroke 06/2014  . PFO (patent foramen ovale)   . COPD (chronic obstructive pulmonary disease)     Family History  family history includes Diabetes in her father,  mother, and sister; Heart attack in her mother and sister; Heart disease in her brother, father, mother, and sister; Hyperlipidemia in her daughter; Hypertension in her father, mother, and sister; Peripheral vascular disease in her mother and sister.  Prior Rehab/Hospitalizations: Patient known to me from previous CIR admission 09/11/14 to 10/02/14 after a prolonged hospital stay.  Has the patient had major surgery during 100 days prior to admission? Yes. Had a massive abdominal surgery in 03/16, Peg placed in 05/16  Current Medications   Current facility-administered medications:  . acetaminophen (TYLENOL) tablet 650 mg, 650 mg, Oral, Q4H PRN **OR** [DISCONTINUED] acetaminophen (TYLENOL) suppository 650 mg, 650 mg, Rectal, Q4H PRN, Ulice Dash, PA-C, 650 mg at 11/12/14 1058 . acetylcysteine (MUCOMYST) 20 % nebulizer / oral solution 3 mL, 3 mL, Nebulization, Q6H, Marvel Plan, MD, 3 mL at 11/25/14 0743 . [START ON 11/26/2014] ALPRAZolam (XANAX) tablet 0.5 mg, 0.5 mg, Per Tube, Q T,Th,Sa-HD, Layne Benton, NP . antiseptic oral rinse solution (CORINZ), 7 mL, Mouth Rinse, QID, Noel Christmas, 7 mL at 11/23/14 1543 . aspirin tablet 325 mg, 325 mg, Oral, Daily, Marvel Plan, MD, 325 mg at 11/25/14 1006 . atropine 1 % ophthalmic solution 4 drop, 4 drop, Sublingual, Q4H PRN, Simonne Martinet, NP, 4 drop at 11/20/14 1933 . budesonide (PULMICORT) nebulizer solution 0.25 mg, 0.25 mg, Nebulization, BID, Ulice Dash, PA-C, 0.25 mg at 11/25/14 0743 . chlorhexidine gluconate (PERIDEX) 0.12 % solution 15 mL, 15 mL, Mouth Rinse, BID, Micki Riley, MD, 15 mL at 11/25/14  0752 . Darbepoetin Alfa (ARANESP) injection 100 mcg, 100 mcg, Intravenous, Q Tue-HD, Beryle Lathe, MD, 100 mcg at 11/24/14 1139 . feeding supplement (NEPRO CARB STEADY) liquid 1,000 mL, 1,000 mL, Oral, Continuous, Reanne J Barbato, RD, Last Rate: 50 mL/hr at 11/23/14 1834, 1,000 mL at 11/23/14 1834 . guaifenesin (ROBITUSSIN) 100 MG/5ML syrup 200 mg, 200 mg, Per Tube, TID, Lupita Leash, MD, 200 mg at 11/25/14 1005 . iohexol (OMNIPAQUE) 300 MG/ML solution 50 mL, 50 mL, Oral, Once PRN, Medication Radiologist, MD, 20 mL at 11/11/14 0012 . ipratropium-albuterol (DUONEB) 0.5-2.5 (3) MG/3ML nebulizer solution 3 mL, 3 mL, Nebulization, Q6H, Christiane Ha, MD, 3 mL at 11/25/14 0743 . ipratropium-albuterol (DUONEB) 0.5-2.5 (3) MG/3ML nebulizer solution 3 mL, 3 mL, Nebulization, Q3H PRN, Christiane Ha, MD, 3 mL at 11/24/14 1515 . lanthanum (FOSRENOL) chewable tablet 500 mg, 500 mg, Per Tube, TID, Zetta Bills, MD, 500 mg at 11/25/14 1006 . levETIRAcetam (KEPPRA) 100 MG/ML solution 500 mg, 500 mg, Per Tube, BID, Micki Riley, MD, 500 mg at 11/25/14 1005 . levothyroxine (SYNTHROID, LEVOTHROID) tablet 25 mcg, 25 mcg, Oral, QAC breakfast, Ulice Dash, PA-C, 25 mcg at 11/25/14 1610 . morphine 2 MG/ML injection 1-2 mg, 1-2 mg, Intravenous, Q2H PRN, Christiane Ha, MD, 2 mg at 11/24/14 1644 . multivitamin (RENA-VIT) tablet 1 tablet, 1 tablet, Oral, QHS, Beryle Lathe, MD, 1 tablet at 11/25/14 0013 . pantoprazole sodium (PROTONIX) 40 mg/20 mL oral suspension 40 mg, 40 mg, Per Tube, Q1200, Cyril Mourning V, MD, 40 mg at 11/24/14 1729 . senna-docusate (Senokot-S) tablet 1 tablet, 1 tablet, Oral, QHS PRN, Ulice Dash, PA-C  Patients Current Diet: Diet NPO time specified  Precautions / Restrictions Precautions Precautions: Fall Precaution Comments: secretions; PEG tube Restrictions Weight Bearing Restrictions: No   Has the patient had 2 or more falls or a fall with injury in the  past year?No  Prior Activity Level Limited Community (1-2x/wk): Goes  out with daughter and son as needed. (Goes to HD Tu-Th-Sat.)  Home Assistive Devices / Equipment Home Assistive Devices/Equipment: Other (Comment) (peg tube)  Prior Device Use: Indicate devices/aids used by the patient prior to current illness, exacerbation or injury? Walker. Uses a seated rollator walker.  Prior Functional Level Prior Function Level of Independence: Independent with assistive device(s) Comments: According to chart 2 months ago, family was assisting with meals and ensuring pt take her proper medication & bill managment.  Self Care: Did the patient need help bathing, dressing, using the toilet or eating? Needed some help  Indoor Mobility: Did the patient need assistance with walking from room to room (with or without device)? Needed some help  Stairs: Did the patient need assistance with internal or external stairs (with or without device)? Needed some help  Functional Cognition: Did the patient need help planning regular tasks such as shopping or remembering to take medications? Needed some help  Current Functional Level Cognition  Arousal/Alertness: Awake/alert Overall Cognitive Status: Impaired/Different from baseline Difficult to assess due to: Impaired communication Current Attention Level: Sustained Orientation Level: Other (comment) Following Commands: Follows one step commands inconsistently, Follows one step commands with increased time General Comments: Pt able to attempt a thumbs up at end of session, using right hand after being asked. Pt very inconsistent with following commands, pt did not touch her nose or stick out her tongue with instructional/verbal, tactile, or demonstrational cues.  Attention: Focused Focused Attention: Impaired Focused Attention Impairment: Verbal basic, Functional basic Memory: (unable to assess, suspect impaired) Awareness: Impaired Awareness  Impairment: Intellectual impairment Problem Solving: Impaired Problem Solving Impairment: Verbal basic, Functional basic   Extremity Assessment (includes Sensation/Coordination)  Upper Extremity Assessment: RUE deficits/detail RUE Deficits / Details: flaccid RUE, pt unable to follow commands to move UE. However, pt would occassionally, functionally move RUE.  RUE Sensation: (unsure) RUE Coordination: decreased fine motor, decreased gross motor  Lower Extremity Assessment: Defer to PT evaluation RLE Deficits / Details: 2 beats of clonus at ankle. Stiffness throughout RLE. No voluntary active movement noted.  LLE Deficits / Details: Increased stiffness throughout LLE. Able to move minimially to get to EOB with max cues.     ADLs  Overall ADL's : Needs assistance/impaired Eating/Feeding: NPO Grooming: Wash/dry face, Total assistance, Sitting Upper Body Bathing: Total assistance Lower Body Bathing: Total assistance General ADL Comments: Pt with strong R lean and pushing with L UE.     Mobility  Overal bed mobility: Needs Assistance, +2 for physical assistance Bed Mobility: Supine to Sit Supine to sit: +2 for physical assistance, Max assist, HOB elevated Sit to supine: Max assist, +2 for physical assistance General bed mobility comments: pt initiate L LE movement with (A) to lift. No initiation with R LE. pt needed hand over hand to initiate reach with L UE No trucal or neck rotation initiated    Transfers  Overall transfer level: Needs assistance Equipment used: 2 person hand held assist Transfers: Sit to/from Stand Sit to Stand: +2 physical assistance, Mod assist Stand pivot transfers: Mod assist, +2 physical assistance General transfer comment: used pad for hip extension and bed elevated for transfer to chair    Ambulation / Gait / Stairs / Wheelchair Mobility       Posture / Balance Dynamic Sitting Balance Sitting balance - Comments: Pt pushing with L Ue  and provided lateral lean WB on BIL UE. pt required BIL LE total (A) elevation to WB on L UE and maintain.  Balance  Overall balance assessment: Needs assistance Sitting-balance support: Bilateral upper extremity supported, Feet supported Sitting balance-Leahy Scale: Poor Sitting balance - Comments: Pt pushing with L Ue and provided lateral lean WB on BIL UE. pt required BIL LE total (A) elevation to WB on L UE and maintain.  Postural control: Right lateral lean    Special needs/care consideration BiPAP/CPAP No CPM No  Continuous Drip IV No Dialysis Yes, get anxious prior to HD. Days Tu-Th-Sat Life Vest  Oxygen: Yes, 02 4L [place]. Breathing treatments ordered.  Special Bed  Trach Size  Wound Vac (area) No  Skin Abdominal incision with dressing  Bowel mgmt: Last BM 11/24/14, with incontinence Bladder mgmt: Voiding even though she has HD. Incontinent of urine. Diabetic mgmt No    Previous Home Environment Home Care Services: Yes Type of Home Care Services: Home RN Additional Comments: No family available to ask home living questions. Unsure of support post discharge.   Discharge Living Setting Plans for Discharge Living Setting: Patient's home, House, Lives with (comment) (Daughter lives with patient in a townhouse.) Type of Home at Discharge: Other (Comment) (Townhouse.) Discharge Home Layout: Two level, 1/2 bath on main level, Bed/bath upstairs (Can stay in living room on main level.) Alternate Level Stairs-Number of Steps: Flight Discharge Home Access: Stairs to enter Entrance Stairs-Number of Steps: 1 small step to porch and a small step into townhome. Does the patient have any problems obtaining your medications?: No  Social/Family/Support Systems Patient Roles: Parent (Has a daughter and a son.) Contact Information: Eviana Sibilia - daughter Anticipated Caregiver: daughter and son and friends Anticipated Caregiver's Contact  Information: [name] - [phone] Ability/Limitations of Caregiver: Daughter works evenings. Son also works but has more flexible hours. Have friends who help with patient. Patient is rarely ever alone. Caregiver Availability: 24/7 Discharge Plan Discussed with Primary Caregiver: Yes Is Caregiver In Agreement with Plan?: Yes Does Caregiver/Family have Issues with Lodging/Transportation while Pt is in Rehab?: No  Goals/Additional Needs Patient/Family Goal for Rehab: PT min assist, OT min to mod assist, ST min assist goals Expected length of stay: 20-25 days Cultural Considerations: None Dietary Needs: NPO with PEG tube feedings Equipment Needs: TBD Special Service Needs: Has HD T-Th-Sat and goes to [employer] when at home. Pt/Family Agrees to Admission and willing to participate: Yes Program Orientation Provided & Reviewed with Pt/Caregiver Including Roles & Responsibilities: Yes  Decrease burden of Care through IP rehab admission: N/A  Possible need for SNF placement upon discharge: Not planned  Patient Condition: This patient's condition remains as documented in the consult dated 11/23/14, in which the Rehabilitation Physician determined and documented that the patient's condition is appropriate for intensive rehabilitative care in an inpatient rehabilitation facility. Daughter is aware of need for additional assistance at home after rehab stay. Will admit to inpatient rehab today.  Preadmission Screen Completed By: [name], 11/25/2014 11:08 AM ______________________________________________________________________  Discussed status with Dr. Riley Kill on 11/25/14 at 1107 and received telephone approval for admission today.  Admission Coordinator: [name], time 1107/Date08/24/16          Cosigned by: Ranelle Oyster, MD at 11/25/2014 12:49 PM  Revision History     Date/Time User Provider Type Action   11/25/2014 12:49 PM Ranelle Oyster, MD Physician  Cosign   11/25/2014 11:09 AM Stephanie Mage, RN Rehab Admission Coordinator Addend   11/24/2014 1:18 PM Ranelle Oyster, MD Physician Cosign   11/24/2014 12:15 PM Stephanie Mage, RN Rehab Admission Coordinator  Sign   View Details Report

## 2014-11-25 NOTE — Progress Notes (Signed)
Subjective:  Daughter in room, no current cos . Noted prior resp .issues And anxiety with HD   Will restart Xanax pre hd .   Objective Vital signs in last 24 hours: Filed Vitals:   11/25/14 0255 11/25/14 0636 11/25/14 0743 11/25/14 0948  BP:  126/86  117/82  Pulse: 95 98  103  Temp:  98 F (36.7 C)  98.7 F (37.1 C)  TempSrc:  Axillary  Oral  Resp: Height:      Weight:      SpO2: 96% 99% 94% 95%   Weight change:   Physical Exam: General: appears more alert /nad currently with O2 mask/ nonverbal Heart: RRR 2/6 hsm  Lungs:  rare rhonchi Abdomen: bs pos. Soft  NT, Nd , PEG tube in place Extremities:  No pedal edema Dialysis Access: r ij perm cath and Pos bruit L arm avf  TTS AF 4h 91kg 2/2 bath Hep none Hect 2 ug Aranesp 200 /wk + venofer 50/wk3    Problem/Plan: 1. ESRD=  TTS schedule /  using AVF this week and had been using as op  With plans for perm cath removal   The week before this admit  Contact [facility] to remove  P. Cath  If functional tomorrow  2 needles/  And restart Xanax pre hd  2. HPTH cont binder/ vit d on hd  3. Anemia darbe 100/wk hgb 10 4. CVA R hemi/ aphasia-  5. Nutrition on TF's 6. Dispo - would not be a candidate for outpt HD in current condition/ attempt hd in recliner   Lenny Pastel, PA-C [facility] Beeper [phone] 11/25/2014,11:00 AM  LOS: 15 days   Pt seen, examined and agree w A/P as above. Possible CIR admit. HD thursday [name] MD pager [phone]    cell [phone] 11/25/2014, 2:30 PM    Labs: Basic Metabolic Panel:  Recent Labs Lab 11/19/14 0841  11/21/14 0859 11/22/14 0208 11/23/14 0441 11/24/14 0604  NA 141  < > 141 138 137 139  K 5.0  < > 3.8 3.2* 3.1* 4.4  CL 102  < > 104 98* 98* 102  CO2 27  < > GLUCOSE 93  < > 79 97 96 112*  BUN 99*  < > 102* 47* 75* 92*  CREATININE 6.89*  < > 7.32* 4.09* 5.88* 7.44*  CALCIUM 8.8*  < > 8.9 8.5* 8.5* 8.9  PHOS 5.8*  --  6.5*  --   --    --   < > = values in this interval not displayed. Liver Function Tests:  Recent Labs Lab 11/19/14 0841 11/21/14 0859  ALBUMIN 2.0* 2.2*  CBC:  Recent Labs Lab 11/20/14 0243 11/21/14 0900 11/22/14 0208 11/23/14 0441 11/24/14 0604  WBC 6.3 5.4 5.5 6.2 6.1  HGB 9.5* 10.3* 10.5* 10.2* 10.0*  HCT 30.5* 33.1* 33.5* 32.1* 31.1*  MCV 88.7 88.7 88.6 87.2 86.4  PLT 269 267 265 295 280   Cardiac Enzymes: No results for input(s): CKTOTAL, CKMB, CKMBINDEX, TROPONINI in the last 168 hours. CBG:  Recent Labs Lab 11/24/14 0416 11/24/14 1510 11/24/14 2003 11/25/14 0005 11/25/14 0402  GLUCAP 113* 106* 90 109* 128*    Studies/Results: Dg Chest Port 1 View  11/24/2014   CLINICAL DATA:  Respiratory distress for 1 day.  EXAM: PORTABLE CHEST - 1 VIEW  COMPARISON:  11/21/2014  FINDINGS: Dual lumen central venous catheter is stable in appearance.  Cardiomediastinal silhouette is  stably enlarged. Mediastinal contours appear intact.  There is interval improvement in the previously noted left lower lobe airspace consolidation. There is no evidence of pleural effusion or pneumothorax.  Osseous structures are without acute abnormality. Soft tissues are grossly normal.  IMPRESSION: Partial improvement of the left lower lobe airspace consolidation.  Enlarged cardiac silhouette.   Electronically Signed   By: Ted Mcalpine M.D.   On: 11/24/2014 15:24   Medications: . feeding supplement (NEPRO CARB STEADY) 1,000 mL (11/23/14 1834)   . acetylcysteine  3 mL Nebulization Q6H  . [START ON 11/26/2014] ALPRAZolam  0.5 mg Per Tube Q T,Th,Sa-HD  . antiseptic oral rinse  7 mL Mouth Rinse QID  . aspirin  325 mg Oral Daily  . budesonide  0.25 mg Nebulization BID  . chlorhexidine gluconate  15 mL Mouth Rinse BID  . darbepoetin (ARANESP) injection - DIALYSIS  100 mcg Intravenous Q Tue-HD  . guaifenesin  200 mg Per Tube TID  . ipratropium-albuterol  3 mL Nebulization Q6H  . lanthanum  500 mg Per Tube TID   . levETIRAcetam  500 mg Per Tube BID  . levothyroxine  25 mcg Oral QAC breakfast  . multivitamin  1 tablet Oral QHS  . pantoprazole sodium  40 mg Per Tube Q1200

## 2014-11-25 NOTE — Progress Notes (Signed)
Occupational Therapy Treatment Patient Details Name: [name] MRN: [id number] DOB: 30-Sep-1955 Today's Date: 11/25/2014    History of present illness Patient is a 59 y/o female who was found to have right sided weakness at dialysis. Was admitted with acute left MCA stroke 8/9 and received IV TPA.Pt was intubated for airway protection in the ER. PMH includes HTN, CKD on HD, depression, aortic aneurysm without rupture, PFO, COPD, CVA.     OT comments  Pt completed stand pivot to chair with weight shift L to initiate stepping and (A) to position R LE. Pt tolerated EOB <>chair transfer.    Follow Up Recommendations  CIR;Supervision/Assistance - 24 hour    Equipment Recommendations  Other (comment)    Recommendations for Other Services Rehab consult    Precautions / Restrictions Precautions Precautions: Fall Precaution Comments: secretions; PEG tube Restrictions Weight Bearing Restrictions: No       Mobility Bed Mobility Overal bed mobility: Needs Assistance;+2 for physical assistance Bed Mobility: Supine to Sit     Supine to sit: +2 for physical assistance;Max assist;HOB elevated     General bed mobility comments: pt initiate L LE movement with (A) to lift. No initiation with R LE. pt needed hand over hand to initiate reach with L UE  No trucal or neck rotation initiated  Transfers Overall transfer level: Needs assistance Equipment used: 2 person hand held assist Transfers: Sit to/from Stand Sit to Stand: +2 physical assistance;Mod assist Stand pivot transfers: Mod assist;+2 physical assistance       General transfer comment: used pad for hip extension and bed elevated for transfer to chair    Balance Overall balance assessment: Needs assistance Sitting-balance support: Bilateral upper extremity supported;Feet supported Sitting balance-Leahy Scale: Poor Sitting balance - Comments: Pt pushing with L Ue and provided lateral lean WB on BIL UE. pt required BIL LE  total (A) elevation to WB on L UE and maintain.  Postural control: Right lateral lean                         ADL Overall ADL's : Needs assistance/impaired Eating/Feeding: NPO   Grooming: Wash/dry face;Total assistance;Sitting   Upper Body Bathing: Total assistance   Lower Body Bathing: Total assistance                         General ADL Comments: Pt with strong R lean and pushing with L UE.       Vision                     Perception     Praxis      Cognition   Behavior During Therapy: Flat affect Overall Cognitive Status: Impaired/Different from baseline Area of Impairment: Attention;Following commands   Current Attention Level: Sustained    Following Commands: Follows one step commands inconsistently;Follows one step commands with increased time   Awareness: Intellectual        Extremity/Trunk Assessment               Exercises General Exercises - Lower Extremity Ankle Circles/Pumps: AAROM;Both;15 reps;Supine   Shoulder Instructions       General Comments      Pertinent Vitals/ Pain       Pain Assessment:  (CPOT score of 2/8)  Home Living  Prior Functioning/Environment              Frequency Min 3X/week     Progress Toward Goals  OT Goals(current goals can now be found in the care plan section)  Progress towards OT goals: Progressing toward goals  Acute Rehab OT Goals Patient Stated Goal: none stated OT Goal Formulation: Patient unable to participate in goal setting Time For Goal Achievement: 12/07/14 Potential to Achieve Goals: Good ADL Goals Pt Will Perform Grooming: bed level;with mod assist Pt Will Perform Upper Body Bathing: bed level;with max assist Pt Will Perform Lower Body Bathing: with max assist;sit to/from stand Pt Will Perform Upper Body Dressing: with max assist;bed level Pt Will Perform Lower Body Dressing: with max assist;sit  to/from stand Pt Will Transfer to Toilet: with mod assist;with +2 assist;bedside commode;stand pivot transfer Pt/caregiver will Perform Home Exercise Program: Increased ROM;Increased strength;Right Upper extremity;With written HEP provided;With minimal assist  Plan Discharge plan remains appropriate    Co-evaluation    PT/OT/SLP Co-Evaluation/Treatment: Yes Reason for Co-Treatment: Complexity of the patient's impairments (multi-system involvement) PT goals addressed during session: Mobility/safety with mobility;Balance;Strengthening/ROM OT goals addressed during session: ADL's and self-care      End of Session Equipment Utilized During Treatment: Oxygen   Activity Tolerance Patient tolerated treatment well   Patient Left in chair;with call bell/phone within reach;with chair alarm set;with family/visitor present   Nurse Communication Mobility status;Precautions        Time: 9604-5409 OT Time Calculation (min): 33 min  Charges: OT General Charges $OT Visit: 1 Procedure OT Treatments $Self Care/Home Management : 8-22 mins  [name] 11/25/2014, 10:46 AM   Mateo Flow   OTR/L Pager: [phone] Office: [phone] .

## 2014-11-25 NOTE — Progress Notes (Signed)
Received pt. As a transfer from 5 central.Pt's daughter was oriented to unit routine and protocol.Safety plan was explained to the family,fall prevention plan was explained and sign by the pt's daughter and the RN.Pt's skin is free of pressure ulcers.Safety video was showed to the pt's daughter.

## 2014-11-25 NOTE — Progress Notes (Signed)
Physical Therapy Treatment Patient Details Name: [name] MRN: [id number] DOB: May 07, 1955 Today's Date: 11/25/2014    History of Present Illness Patient is a 59 y/o female who was found to have right sided weakness at dialysis. Was admitted with acute left MCA stroke 8/9 and received IV TPA.Pt was intubated for airway protection in the ER. PMH includes HTN, CKD on HD, depression, aortic aneurysm without rupture, PFO, COPD, CVA.      PT Comments    Progressing towards physical therapy goals today, focusing on bed mobility, seated balance, and transfers. Tolerates well, mod assist +2 for most tasks including transfer for balance. SpO2 maintained 92-95% on 15L with V-mask at 55%.  Patient will continue to benefit from skilled physical therapy services to further improve independence with functional mobility.   Follow Up Recommendations  CIR     Equipment Recommendations  Other (comment) (TBD.)    Recommendations for Other Services Rehab consult     Precautions / Restrictions Precautions Precautions: Fall Precaution Comments: secretions; PEG tube Restrictions Weight Bearing Restrictions: No    Mobility  Bed Mobility Overal bed mobility: Needs Assistance Bed Mobility: Supine to Sit     Supine to sit: +2 for physical assistance;HOB elevated;Max assist     General bed mobility comments: Able to move LLE to edge of bed with tactile cues. No initiation of RLE. Assist to reach for rail and able to slightly push up through RUE. Max A +2 for RLE and trucal support.  Transfers Overall transfer level: Needs assistance Equipment used: 2 person hand held assist Transfers: Sit to/from [employer] Sit to Stand: Mod assist;+2 physical assistance Stand pivot transfers: Mod assist;+2 physical assistance       General transfer comment: Facilatory techniques for anterior lean, initially blocking patient's knees however progressed to standing without knee block and  absence of buckling. Mod assist +2 for balance and to facilitate weight-shift/unloading to move and pivot RLE.  Ambulation/Gait                 Stairs            Wheelchair Mobility    Modified Rankin (Stroke Patients Only) Modified Rankin (Stroke Patients Only) Pre-Morbid Rankin Score: Moderate disability Modified Rankin: Severe disability     Balance       Sitting balance - Comments: Sat EOB approx 10 minutues focusing on Rt lateral lean with weight through RUE. Great difficulty with lean to left althrough tolerated for approx 1 minute today when LEs are lifted from floor. Good ability to return to midline BIL with cues to push though RUE.  Slow with anterior weight shift, attempting to bear weight through UEs.  Postural control: Right lateral lean                          Cognition Arousal/Alertness: Awake/alert Behavior During Therapy: Flat affect Overall Cognitive Status: Impaired/Different from baseline (difficult to fully assess secondary to aphasia) Area of Impairment: Attention;Following commands   Current Attention Level: Sustained   Following Commands: Follows one step commands inconsistently;Follows one step commands with increased time            Exercises General Exercises - Lower Extremity Ankle Circles/Pumps: AAROM;Both;15 reps;Supine    General Comments General comments (skin integrity, edema, etc.): SpO2 92-95% on 15L with ventimask at 55%.      Pertinent Vitals/Pain Pain Assessment:  (CPOT score of 2/8)    Home Living  Prior Function            PT Goals (current goals can now be found in the care plan section) Acute Rehab PT Goals Patient Stated Goal: none stated as pt unable PT Goal Formulation: Patient unable to participate in goal setting Time For Goal Achievement: 12/07/14 Potential to Achieve Goals: Fair Progress towards PT goals: Progressing toward goals    Frequency  Min  3X/week    PT Plan Current plan remains appropriate    Co-evaluation PT/OT/SLP Co-Evaluation/Treatment: Yes Reason for Co-Treatment: Complexity of the patient's impairments (multi-system involvement);For patient/therapist safety PT goals addressed during session: Mobility/safety with mobility;Balance;Strengthening/ROM       End of Session Equipment Utilized During Treatment: Oxygen Activity Tolerance: Patient tolerated treatment well Patient left: with call bell/phone within reach;with family/visitor present;with SCD's reapplied;in chair;with chair alarm set     Time: [phone] (Billed time split between PT/OT) PT Time Calculation (min) (ACUTE ONLY): 31 min  Charges:  $Therapeutic Activity: 8-22 mins                    G Codes:      Berton Mount 2014/12/20, 9:51 AM Charlsie Merles, PT [phone]

## 2014-11-25 NOTE — Progress Notes (Signed)
Rehab admissions - I observed patient this am working with PT/OT and patient looked better.  Daughter at bedside and says patient gets anxious prior to HD and did not have her medication yesterday.  I spoke with Dr. Riley Kill and to Annie Main, NP.  Patient medically stable today.  Bed available and will admit to acute inpatient rehab today.  Call me for questions.  #[phone]

## 2014-11-25 NOTE — Progress Notes (Signed)
Ranelle Oyster, MD Physician Signed Physical Medicine and Rehabilitation Consult Note 11/23/2014 10:50 AM  Related encounter: ED to Hosp-Admission (Current) from 11/10/2014 in [facility]    Expand All Collapse All        Physical Medicine and Rehabilitation Consult Reason for Consult: Left MCA territory infarct Referring Physician: Dr. Pearlean Brownie   HPI: [name] is a 59 y.o. 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 She was discharged to home with family ambulating short household distances using a rolling walker and minimal assist to contact-guard. Presented 11/10/2014 with right-sided weakness and left gaze deviation. Patient did require intubation and mechanical ventilation for acute respiratory failure and extubated 11/20/2014. MRI of the brain showed moderate size left MCA territory infarct affecting the left insula and operculum. Expected evolution of posterior right ACA and posterior left MCA infarcts seen in March. Patient did receive TPA. Echocardiogram with ejection fraction of 65% no wall motion abnormalities. EEG consistent with toxic metabolic encephalopathy or seizure activity noted. CT angiogram head and neck shows no large vessel proximal occlusion. Neurology consulted aspirin increased to 325 mg daily. Hemodialysis ongoing as per renal services. Patient currently nothing by mouth with PEG tube feeds for nutritional support. Await formal physical and occupational therapy evaluations to be completed. M.D. has requested physical medicine rehabilitation consult.   Review of Systems  Constitutional: Negative for fever and chills.  Eyes: Positive for blurred vision.  Respiratory:  Positive for cough.   Shortness of breath with exertion  Cardiovascular: Positive for palpitations and leg swelling.  Gastrointestinal: Positive for nausea and constipation.  Skin: Negative for rash.  Neurological: Positive for dizziness and weakness. Negative for headaches.   Dysphagia  Psychiatric/Behavioral: Positive for depression. The patient has insomnia.   Anxiety   Past Medical History  Diagnosis Date  . Asthma   . Hypertension   . Gout   . Arthritis   . Insomnia   . Chronic kidney disease   . Depression   . Renal insufficiency   . Aortic aneurysm without rupture     3cm by CT 08/03/14  . Paroxysmal SVT (supraventricular tachycardia)   . Cor pulmonale   . Stroke 06/2014  . PFO (patent foramen ovale)   . COPD (chronic obstructive pulmonary disease)    Past Surgical History  Procedure Laterality Date  . Multiple tooth extractions    . Av fistula placement Left 01/28/2014    Procedure: ARTERIOVENOUS (AV) FISTULA CREATION; Surgeon: Sherren Kerns, MD; Location: [facility] OR; Service: Vascular; Laterality: Left;  . Tee without cardioversion N/A 06/23/2014    Procedure: TRANSESOPHAGEAL ECHOCARDIOGRAM (TEE); Surgeon: Lewayne Bunting, MD; Location: [facility] ENDOSCOPY; Service: Cardiovascular; Laterality: N/A;  . Insertion of dialysis catheter Right 06/27/2014    Procedure: INSERTION OF Right Internal Jugular DIATEK CATHETER; Surgeon: Pryor Ochoa, MD; Location: [facility] OR; Service: Vascular; Laterality: Right;  . Laparotomy N/A 07/01/2014    Procedure: EXPLORATORY LAPAROTOMY WITH CLOSURE OF PERFORATED PYLORIC ULCER; Surgeon: Claud Kelp, MD; Location: [facility] OR; Service: General; Laterality: N/A;  . Bowel resection N/A 07/01/2014    Procedure: SMALL BOWEL RESECTION; Surgeon: Claud Kelp, MD; Location: [facility] OR; Service: General; Laterality: N/A;  . Revison of  arteriovenous fistula Left 09/09/2014    Procedure: LIGATION OF COMPETING BRANCH, &  RESECTION OF VENOUS ANEURYSM OF LEFT UPPER ARM ARTERIOVENOUS FISTULA; Surgeon: Larina Earthly, MD; Location: [facility] OR; Service: Vascular; Laterality: Left;   Family History  Problem Relation Age of Onset  . Diabetes Mother   . Hypertension Mother   . Heart disease Mother   . Heart attack Mother   . Peripheral vascular disease Mother   . Diabetes Father   . Heart disease Father   . Hypertension Father   . Diabetes Sister   . Hypertension Sister   . Heart disease Sister     before age 83  . Heart attack Sister   . Peripheral vascular disease Sister   . Heart disease Brother   . Hyperlipidemia Daughter    Social History:  reports that she has been smoking Cigarettes. She has a 10 pack-year smoking history. She has never used smokeless tobacco. She reports that she drinks alcohol. She reports that she does not use illicit drugs. Allergies:  Allergies  Allergen Reactions  . Heparin Other (See Comments)    Heparin antibody positive; SRA negative   Medications Prior to Admission  Medication Sig Dispense Refill  . ALPRAZolam (XANAX) 0.5 MG tablet Take 1 tablet (0.5 mg total) by mouth Every Tuesday,Thursday,and Saturday with dialysis. 30 tablet 0  . amiodarone (PACERONE) 200 MG tablet 2 tablets daily until 10/05/2014 then 1 tablet daily (Patient taking differently: Take 200 mg by mouth daily. ) 60 tablet 1  . aspirin 81 MG chewable tablet Chew 1 tablet (81 mg total) by mouth daily.    . calcitRIOL (ROCALTROL) 0.5 MCG capsule Take 1 capsule (0.5 mcg total) by mouth every Tuesday, Thursday, and Saturday at 6 PM. 30 capsule 3  . citalopram (CELEXA) 20 MG tablet Take 1 tablet (20 mg total) by mouth daily. 30 tablet 1  . levothyroxine (SYNTHROID, LEVOTHROID) 25 MCG tablet Take 1 tablet (25 mcg total)  by mouth daily before breakfast. (Patient taking differently: Take 25 mcg by mouth every other day. ) 30 tablet 1  . midodrine (PROAMATINE) 10 MG tablet Take 1 tablet (10 mg total) by mouth 2 (two) times daily with a meal. (Patient taking differently: Take 10 mg by mouth daily. ) 60 tablet 0  . montelukast (SINGULAIR) 10 MG tablet Take 1 tablet (10 mg total) by mouth at bedtime. 30 tablet 3  . multivitamin (RENA-VIT) TABS tablet Take 1 tablet by mouth at bedtime. 30 tablet 0  . Nutritional Supplements (FEEDING SUPPLEMENT, JEVITY 1.2 CAL,) LIQD Place 1,000 mLs into feeding tube continuous. 1000 mL 0  . oxyCODONE (ROXICODONE) 5 MG immediate release tablet Take 1 tablet (5 mg total) by mouth every 6 (six) hours as needed for severe pain. 60 tablet 0  . sevelamer carbonate (RENVELA) 800 MG tablet Take 800 mg by mouth 3 (three) times daily with meals.    . vitamin C (VITAMIN C) 250 MG tablet Take 1 tablet (250 mg total) by mouth daily. 30 tablet 1  . budesonide (PULMICORT) 0.25 MG/2ML nebulizer solution Take 2 mLs (0.25 mg total) by nebulization 2 (two) times daily. (Patient not taking: Reported on 11/11/2014) 60 mL 12  . collagenase (SANTYL) ointment Apply topically daily. As directed 15 g 0  . ipratropium (ATROVENT) 0.02 % nebulizer solution Take 2.5 mLs (0.5 mg total) by nebulization 3 (three) times daily. (Patient not taking: Reported on 11/11/2014) 75 mL 12  . levalbuterol (XOPENEX) 0.63 MG/3ML nebulizer solution Take 3 mLs (0.63 mg total) by nebulization 3 (three) times daily. (Patient not taking:  Reported on 11/11/2014) 3 mL 12    Home: Home Living Family/patient expects to be discharged to:: Unsure Additional Comments: No family available to ask home living questions. Unsure of support post discharge.   Functional History: Prior Function Comments: According to chart 2 months ago, family was assisting with meals and ensuring pt take her  proper medication & bill managment. Functional Status:  Mobility: Bed Mobility Overal bed mobility: Needs Assistance Bed Mobility: Supine to Sit, Sit to Supine Supine to sit: +2 for physical assistance, HOB elevated, Max assist Sit to supine: Max assist, +2 for physical assistance General bed mobility comments: Total A of 2 to get to EOB with assist bringing BLEs to EOB, scooting bottom and elevating trunk. Transfers Overall transfer level: (Deferred secondary to inability to follow commands and weakness/fatigue.)      ADL: ADL Overall ADL's : Needs assistance/impaired General ADL Comments: Pt overall total +2 for bed mobility and ADLs due to decreased cognition, decreased functional etremeity movement, decreased trunk control/support  Cognition: Cognition Overall Cognitive Status: Impaired/Different from baseline (difficult to fully assess secondary to aphasia) Arousal/Alertness: Awake/alert Orientation Level: (unable to assess due to language deficits) Attention: Focused Focused Attention: Impaired Focused Attention Impairment: Verbal basic, Functional basic Memory: (unable to assess, suspect impaired) Awareness: Impaired Awareness Impairment: Intellectual impairment Problem Solving: Impaired Problem Solving Impairment: Verbal basic, Functional basic Cognition Arousal/Alertness: Awake/alert Behavior During Therapy: Flat affect Overall Cognitive Status: Impaired/Different from baseline (difficult to fully assess secondary to aphasia) Area of Impairment: Attention, Following commands, Awareness Current Attention Level: Sustained Following Commands: Follows one step commands inconsistently Awareness: Intellectual General Comments: Pt able to attempt a thumbs up at end of session, using right hand after being asked. Pt very inconsistent with following commands, pt did not touch her nose or stick out her tongue with instructional/verbal, tactile, or demonstrational cues.   Difficult to assess due to: Impaired communication (At end of session, therapist asked pt to give her a thumbs up and pt initiated movement of RUE. Otherwise not following simple 1 step commands despite visual/tactile cues. Able to gaze right with auditory cueing (hitting tray table). )  Blood pressure 117/77, pulse 102, temperature 98 F (36.7 C), temperature source Oral, resp. rate 20, height 5' 7.01" (1.702 m), weight 87.091 kg (192 lb), SpO2 94 %. Physical Exam  Eyes:  Pupils reactive to light  Neck: Normal range of motion. Neck supple. No thyromegaly present.  Cardiovascular: Normal rate and regular rhythm.  Respiratory:  Decreased breath sounds at the bases with scattered rhonchi  GI: Soft. Bowel sounds are normal.  PEG tube in place  Neurological: She is alert.  Left gaze deviation. She did not follow motor or verbal commands. She was nonverbal throughout exam  Skin: Skin is warm and dry.     Lab Results Last 24 Hours    Results for orders placed or performed during the hospital encounter of 11/10/14 (from the past 24 hour(s))  Glucose, capillary Status: None   Collection Time: 11/22/14 4:44 PM  Result Value Ref Range   Glucose-Capillary 75 65 - 99 mg/dL  Glucose, capillary Status: None   Collection Time: 11/22/14 8:29 PM  Result Value Ref Range   Glucose-Capillary 73 65 - 99 mg/dL   Comment 1 Notify RN    Comment 2 Document in Chart   Glucose, capillary Status: None   Collection Time: 11/22/14 11:59 PM  Result Value Ref Range   Glucose-Capillary 76 65 - 99 mg/dL   Comment 1 Notify  RN    Comment 2 Document in Chart   Glucose, capillary Status: None   Collection Time: 11/23/14 3:55 AM  Result Value Ref Range   Glucose-Capillary 86 65 - 99 mg/dL   Comment 1 Notify RN    Comment 2 Document in Chart   CBC Status: Abnormal   Collection Time: 11/23/14 4:41 AM  Result Value  Ref Range   WBC 6.2 4.0 - 10.5 K/uL   RBC 3.68 (L) 3.87 - 5.11 MIL/uL   Hemoglobin 10.2 (L) 12.0 - 15.0 g/dL   HCT 69.6 (L) 29.5 - 28.4 %   MCV 87.2 78.0 - 100.0 fL   MCH 27.7 26.0 - 34.0 pg   MCHC 31.8 30.0 - 36.0 g/dL   RDW 13.2 (H) 44.0 - 10.2 %   Platelets 295 150 - 400 K/uL  Basic metabolic panel Status: Abnormal   Collection Time: 11/23/14 4:41 AM  Result Value Ref Range   Sodium 137 135 - 145 mmol/L   Potassium 3.1 (L) 3.5 - 5.1 mmol/L   Chloride 98 (L) 101 - 111 mmol/L   CO2 26 22 - 32 mmol/L   Glucose, Bld 96 65 - 99 mg/dL   BUN 75 (H) 6 - 20 mg/dL   Creatinine, Ser 7.25 (H) 0.44 - 1.00 mg/dL   Calcium 8.5 (L) 8.9 - 10.3 mg/dL   GFR calc non Af Amer 7 (L) >60 mL/min   GFR calc Af Amer 8 (L) >60 mL/min   Anion gap 13 5 - 15  Glucose, capillary Status: None   Collection Time: 11/23/14 7:47 AM  Result Value Ref Range   Glucose-Capillary 91 65 - 99 mg/dL   Comment 1 Notify RN   Glucose, capillary Status: Abnormal   Collection Time: 11/23/14 11:09 AM  Result Value Ref Range   Glucose-Capillary 107 (H) 65 - 99 mg/dL      Imaging Results (Last 48 hours)    No results found.    Assessment/Plan: Diagnosis: left MCA infarct, respiratory faliure 1. Does the need for close, 24 hr/day medical supervision in concert with the patient's rehab needs make it unreasonable for this patient to be served in a less intensive setting? Yes 2. Co-Morbidities requiring supervision/potential complications: htn, afib 3. Due to bladder management, bowel management, safety, skin/wound care, disease management, medication administration and patient education, does the patient require 24 hr/day rehab nursing? Yes 4. Does the patient require coordinated care of a physician, rehab nurse, PT (1-2 hrs/day, 5 days/week), OT (1-2 hrs/day, 5 days/week) and SLP (1-2 hrs/day, 5  days/week) to address physical and functional deficits in the context of the above medical diagnosis(es)? Yes Addressing deficits in the following areas: balance, endurance, locomotion, strength, transferring, bowel/bladder control, bathing, dressing, feeding, grooming, toileting, cognition, speech, language, swallowing and psychosocial support 5. Can the patient actively participate in an intensive therapy program of at least 3 hrs of therapy per day at least 5 days per week? Yes 6. The potential for patient to make measurable gains while on inpatient rehab is excellent 7. Anticipated functional outcomes upon discharge from inpatient rehab are min assist with PT, min assist and mod assist with OT, min assist with SLP. 8. Estimated rehab length of stay to reach the above functional goals is: 20-25 days 9. Does the patient have adequate social supports and living environment to accommodate these discharge functional goals? Potentially 10. Anticipated D/C setting: Home 11. Anticipated post D/C treatments: HH therapy and Outpatient therapy 12. Overall Rehab/Functional Prognosis: excellent  RECOMMENDATIONS: This patient's condition is appropriate for continued rehabilitative care in the following setting: CIR Patient has agreed to participate in recommended program. Yes Note that insurance prior authorization may be required for reimbursement for recommended care.  Comment: Family will likely need to provide more assistance at home than they did after last rehab admission. Rehab Admissions Coordinator to follow up.  Thanks,  Ranelle Oyster, MD, [place] [facility]     11/23/2014       Revision History     Date/Time User Provider Type Action   11/23/2014 3:00 PM Ranelle Oyster, MD Physician Sign   11/23/2014 11:25 AM Charlton Amor, PA-C Physician Assistant Pend   View Details Report       Routing History     Date/Time From To Method   11/23/2014 3:00 PM Ranelle Oyster, MD Ranelle Oyster, MD In Basket   11/23/2014 3:00 PM Ranelle Oyster, MD Gwenyth Bender, MD In [facility]

## 2014-11-25 NOTE — Progress Notes (Signed)
SLP Cancellation Note  Patient Details Name: Miraya C Bowley MRN: [id number] DOB: [birth year]   Cancelled treatment:       Reason Eval/Treat Not Completed: Other (comment) (per SLP discussion with adm coordinator, pt to go to CIR today, RN reports pt remains on 40% face mask for respiratory support and RT has been working with pt this am)   Donavan Burnet, MS [facility] SLP [phone]

## 2014-11-25 NOTE — Progress Notes (Signed)
[facility] PROGRESS NOTE  [name] WUJ:[id number] DOB: 01-20-1956 DOA: 11/10/2014 PCP: [name], MD  D/w Dr. Hermelinda Medicus about tachypnea in dialysis. Called to bedside by RN for respiratory distress  Assessment/Plan: Primary problem: Acute respiratory failure with tachypnea: much improved after suctioning and pulmonary toilet, a dose of Solu-Medrol, and bronchodilators and a dose of morphine for comfort. Occurred during dialysis. Patient's daughter reports that patient previously had been getting Xanax with each dialysis prior to admission, so may be also an element of anxiety. Will wean to nasal cannula and can likely go to CIR. Recommend continued aggressive pulmonary toilet at rehabilitation.  Active Problems:   CVA (cerebral infarction)   Acute respiratory failure with hypoxemia   PFO (patent foramen ovale)   Essential hypertension   Paroxysmal a-fib   HLD (hyperlipidemia)   ESRD on dialysis   History of stroke   DNAR (do not attempt resuscitation)   PEG (percutaneous endoscopic gastrostomy) status   Cerebral infarction due to embolism of left middle cerebral artery  HPI/Subjective: Per nursing staff and daughter, had a stable night. Was comfortable. Daughter believes patient had an anxiety attack and dialysis.  Objective: Filed Vitals:   11/25/14 0948  BP: 117/82  Pulse: 103  Temp: 98.7 F (37.1 C)  Resp: 24    Intake/Output Summary (Last 24 hours) at 11/25/14 1044 Last data filed at 11/24/14 1413  Gross per 24 hour  Intake      0 ml  Output    535 ml  Net   -535 ml   Filed Weights   11/24/14 0706 11/24/14 1043 11/24/14 1413  Weight: 89.268 kg (196 lb 12.8 oz) 87.7 kg (193 lb 5.5 oz) 87.2 kg (192 lb 3.9 oz)    Exam:   General:  Alert. Nonverbal. Appears comfortable in chair with Ventimask at 40%. will track.  Cardiovascular: RRR  Respiratory: good air movement. No prolongation of expiratory phase. No wheezes. Occasional rhonchi. Breathing  nonlabored.  Abdomen: S, NT, ND  Ext: no CCE  Basic Metabolic Panel:  Recent Labs Lab 11/19/14 0841 11/20/14 0243 11/21/14 0859 11/22/14 0208 11/23/14 0441 11/24/14 0604  NA 141 141 141 138 137 139  K 5.0 3.4* 3.8 3.2* 3.1* 4.4  CL 102 102 104 98* 98* 102  CO2 27 27 22 27 26 25   GLUCOSE 93 97 79 97 96 112*  BUN 99* 70* 102* 47* 75* 92*  CREATININE 6.89* 5.79* 7.32* 4.09* 5.88* 7.44*  CALCIUM 8.8* 8.8* 8.9 8.5* 8.5* 8.9  PHOS 5.8*  --  6.5*  --   --   --    Liver Function Tests:  Recent Labs Lab 11/19/14 0841 11/21/14 0859  ALBUMIN 2.0* 2.2*   No results for input(s): LIPASE, AMYLASE in the last 168 hours. No results for input(s): AMMONIA in the last 168 hours. CBC:  Recent Labs Lab 11/20/14 0243 11/21/14 0900 11/22/14 0208 11/23/14 0441 11/24/14 0604  WBC 6.3 5.4 5.5 6.2 6.1  HGB 9.5* 10.3* 10.5* 10.2* 10.0*  HCT 30.5* 33.1* 33.5* 32.1* 31.1*  MCV 88.7 88.7 88.6 87.2 86.4  PLT 269 267 265 295 280   Cardiac Enzymes: No results for input(s): CKTOTAL, CKMB, CKMBINDEX, TROPONINI in the last 168 hours. BNP (last 3 results)  Recent Labs  06/17/14 2043  BNP 1378.8*    ProBNP (last 3 results) No results for input(s): PROBNP in the last 8760 hours.  CBG:  Recent Labs Lab 11/24/14 0416 11/24/14 1510 11/24/14 2003 11/25/14 0005 11/25/14 0402  GLUCAP 113* 106* 90 109* 128*    No results found for this or any previous visit (from the past 240 hour(s)).   Studies: Dg Chest Port 1 View  11/24/2014   CLINICAL DATA:  Respiratory distress for 1 day.  EXAM: PORTABLE CHEST - 1 VIEW  COMPARISON:  11/21/2014  FINDINGS: Dual lumen central venous catheter is stable in appearance.  Cardiomediastinal silhouette is stably enlarged. Mediastinal contours appear intact.  There is interval improvement in the previously noted left lower lobe airspace consolidation. There is no evidence of pleural effusion or pneumothorax.  Osseous structures are without acute  abnormality. Soft tissues are grossly normal.  IMPRESSION: Partial improvement of the left lower lobe airspace consolidation.  Enlarged cardiac silhouette.   Electronically Signed   By: Ted Mcalpine M.D.   On: 11/24/2014 15:24    Scheduled Meds: . acetylcysteine  3 mL Nebulization Q6H  . [START ON 11/26/2014] ALPRAZolam  0.5 mg Per Tube Q T,Th,Sa-HD  . antiseptic oral rinse  7 mL Mouth Rinse QID  . aspirin  325 mg Oral Daily  . budesonide  0.25 mg Nebulization BID  . chlorhexidine gluconate  15 mL Mouth Rinse BID  . darbepoetin (ARANESP) injection - DIALYSIS  100 mcg Intravenous Q Tue-HD  . guaifenesin  200 mg Per Tube TID  . ipratropium-albuterol  3 mL Nebulization Q6H  . lanthanum  500 mg Per Tube TID  . levETIRAcetam  500 mg Per Tube BID  . levothyroxine  25 mcg Oral QAC breakfast  . multivitamin  1 tablet Oral QHS  . pantoprazole sodium  40 mg Per Tube Q1200   Continuous Infusions: . feeding supplement (NEPRO CARB STEADY) 1,000 mL (11/23/14 1834)    Time 25 min  Jericho Alcorn L  [facility] [email or web address], password [facility] 11/25/2014, 10:44 AM  LOS: 15 days

## 2014-11-26 ENCOUNTER — Inpatient Hospital Stay (HOSPITAL_COMMUNITY): Payer: Medicaid Other | Admitting: Physical Therapy

## 2014-11-26 ENCOUNTER — Inpatient Hospital Stay (HOSPITAL_COMMUNITY): Payer: Medicaid Other | Admitting: Occupational Therapy

## 2014-11-26 ENCOUNTER — Inpatient Hospital Stay (HOSPITAL_COMMUNITY): Payer: Medicaid Other | Admitting: Speech Pathology

## 2014-11-26 LAB — GLUCOSE, CAPILLARY
GLUCOSE-CAPILLARY: 102 mg/dL — AB (ref 65–99)
GLUCOSE-CAPILLARY: 123 mg/dL — AB (ref 65–99)
Glucose-Capillary: 118 mg/dL — ABNORMAL HIGH (ref 65–99)
Glucose-Capillary: 93 mg/dL (ref 65–99)
Glucose-Capillary: 88 mg/dL (ref 65–99)

## 2014-11-26 MED ORDER — ACETYLCYSTEINE 20 % IN SOLN
3.0000 mL | Freq: Two times a day (BID) | RESPIRATORY_TRACT | Status: DC
  Administered 2014-11-26 – 2014-11-30 (×8): 3 mL via RESPIRATORY_TRACT
  Administered 2014-12-01: 4 mL via RESPIRATORY_TRACT
  Administered 2014-12-01: 3 mL via RESPIRATORY_TRACT
  Administered 2014-12-02: 21:00:00 via RESPIRATORY_TRACT
  Filled 2014-11-26 (×15): qty 4

## 2014-11-26 MED ORDER — NEPRO/CARBSTEADY PO LIQD
1000.0000 mL | ORAL | Status: DC
  Administered 2014-11-28 – 2014-12-09 (×11): 1000 mL
  Filled 2014-11-26 (×21): qty 1000

## 2014-11-26 MED ORDER — LEVETIRACETAM 500 MG/5ML IV SOLN
500.0000 mg | Freq: Two times a day (BID) | INTRAVENOUS | Status: DC
  Filled 2014-11-26 (×2): qty 5

## 2014-11-26 NOTE — Care Management Note (Signed)
[facility] Individual Statement of Services  Patient Name:  [name]  Date:  11/26/2014  Welcome to the [facility].  Our goal is to provide you with an individualized program based on your diagnosis and situation, designed to meet your specific needs.  With this comprehensive rehabilitation program, you will be expected to participate in at least 3 hours of rehabilitation therapies Monday-Friday, with modified therapy programming on the weekends.  Your rehabilitation program will include the following services:  Physical Therapy (PT), Occupational Therapy (OT), Speech Therapy (ST), 24 hour per day rehabilitation nursing, Therapeutic Recreaction (TR), Neuropsychology, Case Management (Social Worker), Rehabilitation Medicine, Nutrition Services and Pharmacy Services  Weekly team conferences will be held on Wednesday to discuss your progress.  Your Social Worker will talk with you frequently to get your input and to update you on team discussions.  Team conferences with you and your family in attendance may also be held.  Expected length of stay: 18-24 days  Overall anticipated outcome: min level of assist  Depending on your progress and recovery, your program may change. Your Social Worker will coordinate services and will keep you informed of any changes. Your Social Worker's name and contact numbers are listed  below.  The following services may also be recommended but are not provided by the [facility]:  Home Health Rehabiltiation Services  Outpatient Rehabilitation Services   Arrangements will be made to provide these services after discharge if needed.  Arrangements include referral to agencies that provide these services.  Your insurance has been verified to be:  Medicaid Your primary doctor is:  [name]  Pertinent information will be shared with your doctor and your insurance company.  Social Worker:  Dossie Der,  SW [phone] or [phone]  Information discussed with and copy given to patient by: Lucy Chris, 11/26/2014, 12:39 PM

## 2014-11-26 NOTE — Progress Notes (Signed)
Subjective:   Now on Rehab floor/ Speech therapy in room now eval swallowing/ no cos/ for HD today on schedule  Objective Vital signs in last 24 hours: Filed Vitals:   11/26/14 0300 11/26/14 0621 11/26/14 1054 11/26/14 1057  BP:  106/76    Pulse:  100 103 102  Temp:  98.4 F (36.9 C)    TempSrc:  Oral    Resp: 30 19  20   Weight:  88.8 kg (195 lb 12.3 oz)    SpO2:  97% 94% 94%   Weight change:   Physical Exam: General:  alert /nad/ nonverbal but smiling some to conversation  Heart: RRR 2/6 hsm  Lungs:some BS  decr at bases and rare rhonchi Abdomen: bs pos. Soft NT, Nd , PEG tube in place Extremities: No pedal edema Dialysis Access: r ij perm cath and Pos bruit L arm avf  TTS AF 4h 91kg 2/2 bath Hep none Hect 2 ug Aranesp 200 /wk + venofer 50/wk3    Problem/Plan: 1. ESRD= TTS schedule / using AVF this week and had been using as op/ Had  plans for perm cath removal  before this admit Contact [facility] to remove P. Cath If avf with  2 needles/ have restarted Xanax pre hd used in past   2. HPTH cont binder/was on  vit d as o  With sl ^ ca on hold / pre  hd fu labs  3. Anemia darbe 100/wk hgb 10 4. CVA R hemi/ aphasia- on rehab floor now  5. Nutrition on TF's speech eval for swallowing in progress  Lenny Pastel, PA-C [facility] Beeper [phone] 11/26/2014,11:40 AM  LOS: 1 day   Pt seen, examined and agree w A/P as above.  [name] MD pager [phone]    cell [phone] 11/26/2014, 2:07 PM.s Labs: Basic Metabolic Panel:  Recent Labs Lab 11/21/14 0859 11/22/14 0208 11/23/14 0441 11/24/14 0604  NA 141 138 137 139  K 3.8 3.2* 3.1* 4.4  CL 104 98* 98* 102  CO2 22 27 26 25   GLUCOSE 79 97 96 112*  BUN 102* 47* 75* 92*  CREATININE 7.32* 4.09* 5.88* 7.44*  CALCIUM 8.9 8.5* 8.5* 8.9  PHOS 6.5*  --   --   --    Liver Function Tests:  Recent Labs Lab 11/21/14 0859  ALBUMIN 2.2*   No results for input(s): LIPASE, AMYLASE in the  last 168 hours. No results for input(s): AMMONIA in the last 168 hours. CBC:  Recent Labs Lab 11/20/14 0243 11/21/14 0900 11/22/14 0208 11/23/14 0441 11/24/14 0604  WBC 6.3 5.4 5.5 6.2 6.1  HGB 9.5* 10.3* 10.5* 10.2* 10.0*  HCT 30.5* 33.1* 33.5* 32.1* 31.1*  MCV 88.7 88.7 88.6 87.2 86.4  PLT 269 267 265 295 280   Cardiac Enzymes: No results for input(s): CKTOTAL, CKMB, CKMBINDEX, TROPONINI in the last 168 hours. CBG:  Recent Labs Lab 11/25/14 1143 11/25/14 1608 11/26/14 0122 11/26/14 0642 11/26/14 1132  GLUCAP 100* 75 102* 118* 93    Studies/Results: Dg Chest Port 1 View  11/24/2014   CLINICAL DATA:  Respiratory distress for 1 day.  EXAM: PORTABLE CHEST - 1 VIEW  COMPARISON:  11/21/2014  FINDINGS: Dual lumen central venous catheter is stable in appearance.  Cardiomediastinal silhouette is stably enlarged. Mediastinal contours appear intact.  There is interval improvement in the previously noted left lower lobe airspace consolidation. There is no evidence of pleural effusion or pneumothorax.  Osseous structures are without acute abnormality. Soft tissues are  grossly normal.  IMPRESSION: Partial improvement of the left lower lobe airspace consolidation.  Enlarged cardiac silhouette.   Electronically Signed   By: Ted Mcalpine M.D.   On: 11/24/2014 15:24   Medications: . feeding supplement (NEPRO CARB STEADY) 1,000 mL (11/26/14 0250)   . acetylcysteine  3 mL Nebulization Q6H  . ALPRAZolam  0.5 mg Per Tube Q T,Th,Sa-HD  . antiseptic oral rinse  7 mL Mouth Rinse QID  . aspirin  325 mg Oral Daily  . budesonide  0.25 mg Nebulization BID  . chlorhexidine gluconate  15 mL Mouth Rinse BID  . [START ON 12/01/2014] darbepoetin (ARANESP) injection - DIALYSIS  100 mcg Intravenous Q Tue-HD  . guaifenesin  200 mg Per Tube TID  . ipratropium-albuterol  3 mL Nebulization Q6H  . lanthanum  500 mg Per Tube TID  . levETIRAcetam  500 mg Per Tube BID  . levothyroxine  25 mcg Oral QAC  breakfast  . multivitamin  1 tablet Oral QHS  . pantoprazole sodium  40 mg Per Tube Q1200

## 2014-11-26 NOTE — Evaluation (Signed)
Occupational Therapy Assessment and Plan  Patient Details  Name: [name] MRN: [id number] Date of Birth: [birth year]  OT Diagnosis: abnormal posture, altered mental status, apraxia, cognitive deficits, hemiplegia affecting dominant side and muscle weakness (generalized) Rehab Potential: Rehab Potential (ACUTE ONLY): Good ELOS: 23-25 days   Today's Date: 11/26/2014 OT Individual Time: 2229-7989 OT Individual Time Calculation (min): 89 min     Problem List:  Patient Active Problem List   Diagnosis Date Noted  . Left middle cerebral artery stroke 11/25/2014  . Right hemiparesis 11/25/2014  . Aphasia due to stroke 11/25/2014  . Acute respiratory failure   . Cerebral infarction due to embolism of left middle cerebral artery   . Gastrostomy tube in place   . PEG (percutaneous endoscopic gastrostomy) status   . Respiratory distress   . DNAR (do not attempt resuscitation) 11/20/2014  . Paroxysmal a-fib 11/16/2014  . HLD (hyperlipidemia) 11/16/2014  . ESRD on dialysis 11/16/2014  . History of stroke 11/16/2014  . Essential hypertension   . PFO (patent foramen ovale)   . Acute respiratory failure with hypoxemia   . CVA (cerebral infarction) 11/10/2014  . Chronic ischemic colitis 09/25/2014  . Acute right ACA stroke 09/11/2014  . Left hemiparesis 09/11/2014  . History of ETT   . Duodenal fistula   . Sepsis   . Aortic aneurysm 08/13/2014  . Protein-calorie malnutrition   . Itching 08/12/2014  . Duodenal anastomotic leak   . Protein calorie malnutrition   . SVT (supraventricular tachycardia)   . Right heart failure   . ESRD (end stage renal disease)   . Abdominal pain   . Palliative care encounter   . 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 Fistula   . History of intermediate V/Q scan   . Other emphysema   . Intra-abdominal abscess   . Abnormal TSH   . Enteritis due to Clostridium difficile 07/19/2014  . Abdominal abscess   . Pelvic fluid collection   .  Perforated gastric ulcer 07/01/2014  . SOB (shortness of breath)   . PJRT (permanent junctional reciprocating tachycardia)   . Paroxysmal atrial tachycardia   . PSVT (paroxysmal supraventricular tachycardia)   . Stroke   . Hyperlipidemia   . 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 Hypertension 06/20/2014  . ESRD needing dialysis 06/20/2014  . Obesity (BMI 30-39.9) 06/20/2014  . Depression 06/20/2014  . Left renal mass 06/20/2014  . Chronic diastolic heart failure 21/19/4174  . H1N1 influenza 06/20/2014  . Tobacco use disorder 06/20/2014  . ARF (acute renal failure)   . Renal failure (ARF), acute on chronic   . Acute respiratory failure with hypoxia 06/17/2014  . End stage renal disease 01/21/2014    Past Medical History:  Past Medical History  Diagnosis Date  . Asthma   . Hypertension   . Gout   . Arthritis   . Insomnia   . Chronic kidney disease   . Depression   . Renal insufficiency   . Aortic aneurysm without rupture     3cm by CT 08/03/14  . Paroxysmal SVT (supraventricular tachycardia)   . Cor pulmonale   . Stroke 06/2014  . PFO (patent foramen ovale)   . COPD (chronic obstructive pulmonary disease)    Past Surgical History:  Past Surgical History  Procedure Laterality Date  . Multiple tooth extractions    . Av fistula placement Left 01/28/2014    Procedure: ARTERIOVENOUS (AV) FISTULA CREATION;  Surgeon: Elam Dutch, MD;  Location: [facility]  OR;  Service: Vascular;  Laterality: Left;  . Tee without cardioversion N/A 06/23/2014    Procedure: TRANSESOPHAGEAL ECHOCARDIOGRAM (TEE);  Surgeon: Lelon Perla, MD;  Location: [place];  Service: Cardiovascular;  Laterality: N/A;  . Insertion of dialysis catheter Right 06/27/2014    Procedure: INSERTION OF Right Internal Jugular DIATEK CATHETER;  Surgeon: Mal Misty, MD;  Location: [place];  Service: Vascular;  Laterality: Right;  . Laparotomy N/A 07/01/2014    Procedure: EXPLORATORY LAPAROTOMY WITH  CLOSURE OF PERFORATED PYLORIC ULCER;  Surgeon: Fanny Skates, MD;  Location: [place];  Service: General;  Laterality: N/A;  . Bowel resection N/A 07/01/2014    Procedure: SMALL BOWEL RESECTION;  Surgeon: Fanny Skates, MD;  Location: [place];  Service: General;  Laterality: N/A;  . Revison of arteriovenous fistula Left 09/09/2014    Procedure: LIGATION OF COMPETING BRANCH, & RESECTION OF VENOUS ANEURYSM OF LEFT UPPER ARM ARTERIOVENOUS FISTULA;  Surgeon: Rosetta Posner, MD;  Location: [facility] OR;  Service: Vascular;  Laterality: Left;    Assessment & Plan Clinical Impression: Patient is a 59 y.o. year old female with recent admission to the hospital on 11/10/2014 with right-sided weakness and left gaze deviation. Patient did require intubation and mechanical ventilation for acute respiratory failure and extubated 11/20/2014. MRI of the brain showed moderate size left MCA territory infarct affecting the left insula and operculum. Expected evolution of posterior right ACA and posterior left MCA infarcts seen in March. Patient transferred to CIR on 11/25/2014 .    Patient currently requires total with basic self-care skills secondary to muscle weakness, decreased cardiorespiratoy endurance and decreased oxygen support, impaired timing and sequencing, abnormal tone, unbalanced muscle activation, motor apraxia, decreased coordination and decreased motor planning, decreased initiation, decreased attention, decreased awareness, decreased problem solving, decreased safety awareness, decreased memory and delayed processing and decreased sitting balance, decreased standing balance, decreased postural control, hemiplegia and decreased balance strategies.  Prior to hospitalization, patient could complete ADL with supervision.  Patient will benefit from skilled intervention to decrease level of assist with basic self-care skills prior to discharge home with care partner.  Anticipate patient will require minimal physical assistance  and follow up home health.  OT - End of Session Activity Tolerance: Decreased this session Endurance Deficit Description: significant dyspnea with selfcare tasks, O2 dependency OT Assessment Rehab Potential (ACUTE ONLY): Good Barriers to Discharge: Decreased caregiver support Barriers to Discharge Comments: Pt will need 24 hour physical assist. OT Patient demonstrates impairments in the following area(s): Balance;Cognition;Endurance;Motor;Safety;Perception OT Basic ADL's Functional Problem(s): Eating;Grooming;Bathing;Dressing;Toileting OT Transfers Functional Problem(s): Toilet;Tub/Shower OT Additional Impairment(s): Fuctional Use of Upper Extremity OT Plan OT Intensity: Minimum of 1-2 x/day, 45 to 90 minutes OT Frequency: 5 out of 7 days OT Duration/Estimated Length of Stay: 23-25 days OT Treatment/Interventions: Balance/vestibular training;Cognitive remediation/compensation;DME/adaptive equipment instruction;Neuromuscular re-education;Self Care/advanced ADL retraining;Therapeutic Exercise;UE/LE Strength taining/ROM;UE/LE Coordination activities;Splinting/orthotics;Patient/family education;Functional electrical stimulation;Discharge planning;Functional mobility training;Therapeutic Activities;Visual/perceptual remediation/compensation OT Self Feeding Anticipated Outcome(s): supervision OT Basic Self-Care Anticipated Outcome(s): min to mod assist OT Toileting Anticipated Outcome(s): min assist OT Bathroom Transfers Anticipated Outcome(s): min assist OT Recommendation Patient destination: Home Follow Up Recommendations: Home health OT;24 hour supervision/assistance Equipment Recommended: 3 in 1 bedside comode;Tub/shower bench   Skilled Therapeutic Intervention Pt began education on selfcare re-training sit to stand in from the wheelchair.  Pt with increased pushing and LOB posteriorly and to the right in sitting.  Decreased initiation for all grooming and bathing tasks.  Max hand over  hand assistance needed for pt  to perform washing her face or washing any of her UB.  Max assist needed for initial standing with +2 assistance needed to wash perianal area and complete donning new brief.  Pt's friend present for session and instructed to sit on pt's right side to encourage visual scanning.  Pt left in wheelchair at end of session with 1/2 lap tray for support of the RUE.    OT Evaluation Precautions/Restrictions  Precautions Precautions: Fall Precaution Comments: fall, NPO, pusher to the right at times, non-verbal,  Restrictions Weight Bearing Restrictions: No  Vital Signs Therapy Vitals Pulse Rate: (!) 102 Resp: 20 Patient Position (if appropriate): Sitting Oxygen Therapy SpO2: 94 % O2 Device: Nasal Cannula O2 Flow Rate (L/min): 6 L/min Pulse Oximetry Type: Intermittent Pain Pain Assessment Faces Pain Scale: No hurt Home Living/Prior Functioning Home Living Family/patient expects to be discharged to:: Private residence Living Arrangements: Children Available Help at Discharge: Family, Available 24 hours/day Type of Home: Apartment Home Access: Stairs to enter Home Layout: One level Bathroom Shower/Tub: Chiropodist: Standard  Lives With: Son, Daughter IADL History Occupation: On disability Prior Function Driving: No ADL  See Function section of chart  Vision/Perception  Vision- History Baseline Vision/History:  (Unable to determine at this time secondary to pt being non-verbal and not responding to questions) Vision- Assessment Vision Assessment?: Vision impaired- to be further tested in functional context Eye Alignment: Within Functional Limits Ocular Range of Motion: Impaired-to be further tested in functional context Alignment/Gaze Preference: Chin down;Head turned;Gaze left Tracking/Visual Pursuits:  (Pt not tracking therapist's hand to command) Additional Comments: Pt did scan across midline to the right side to locate  therapist but does not follow commands to follow moving object. Praxis Praxis-Other Comments: Pt with decreased ability to integrate and use either hand during selfcare tasks.  Needed hand over hand max assist for simple grooming and bathing tasks.   Cognition Overall Cognitive Status: Impaired/Different from baseline Arousal/Alertness: Awake/alert Orientation Level:  (Pt non-verbal and is not able to use visual method for BIMS testing) Attention: Focused Focused Attention: Impaired Focused Attention Impairment: Functional basic Awareness Impairment: Intellectual impairment Problem Solving: Impaired Problem Solving Impairment: Functional basic Behaviors: Lability Safety/Judgment: Impaired Comments: Pt non-verbal demonstrates decreased ability to follow one step commands or initiate performance of selfcare tasks at this time.   Sensation Sensation Light Touch: Not tested Stereognosis: Not tested Hot/Cold: Not tested Proprioception: Not tested Additional Comments: Unable to accurately asses on evaluation Coordination Gross Motor Movements are Fluid and Coordinated: No Fine Motor Movements are Fluid and Coordinated: No Coordination and Movement Description: Pt with limited AROM during functional tasks with the RUE.  Noted spontaneous movement in the right shoulder and elbow but pt resistance to AROM.  Motor apraxia also present so pt unable to spontaneously use the LUE for grooming tasks for bathing. Motor  Motor Motor: Hemiplegia;Motor apraxia Motor - Skilled Clinical Observations: Decreased functional movement and initiation on both the RUE and RLE.  Increase lean and pushing to the right side in sitting and standing Mobility    See Function section of chart  Trunk/Postural Assessment  Cervical Assessment Cervical Assessment: Exceptions to [facility] (Head flexed and rotated to the left) Thoracic Assessment Thoracic Assessment: Within Functional Limits Lumbar Assessment Lumbar  Assessment: Within Functional Limits Postural Control Postural Control: Deficits on evaluation Righting Reactions: Pt with increased right side trunk elongation as well as maintaining posterior pelvic tilt.  Balance Balance Balance Assessed: Yes Static Sitting Balance Static Sitting - Balance Support:  No upper extremity supported Static Sitting - Level of Assistance: 2: Max assist (Pt initially sitting with min guard assist and regressed to max assist in wheelchair during bathing) Dynamic Sitting Balance Dynamic Sitting - Level of Assistance: 2: Max assist Static Standing Balance Static Standing - Balance Support: Right upper extremity supported;Left upper extremity supported Static Standing - Level of Assistance: 2: Max assist Extremity/Trunk Assessment RUE Assessment RUE Assessment: Exceptions to [facility] RUE Strength RUE Overall Strength Comments: Pt with limited AROM noted during functional task or to command.  Resistance noted with elbow flexion and extension as well as shoulder flexion as therapist performed PROM.  Unsure of this is tone related or just pt resisting movement.  Therapsit able to achieve full AROM with increased time however.  No active movement noted in the hand or resitance withfull PROM in the digits. LUE Assessment LUE Assessment: Exceptions to [facility] LUE Strength LUE Overall Strength Comments: Pt able to close left hand around therapist hand or oxygen tubing but did not demonstrate functional movement for bathing and dressing secondary to apraxia and decreased coordination.  Will continue to further assess during functional treatments  Function:   Eating Eating               Grooming Oral Care,Brush Teeth, Clean Dentures Activity:      Assist Level: Dependant (Pt equals 0%)      Wash, Rinse, Dry Face Activity          Wash, Rinse, Dry Hands Activity   Assist Level: Helper performed activity      Brush, Comb Hair Activity   Assist Level: Helper  performed activity    Shave Activity          Apply Makeup Activity                                                             Bathing Bathing position      Bathing parts   Body parts bathed by helper: Right arm;Left arm;Chest;Abdomen;Front perineal area;Buttocks;Right upper leg;Left upper leg;Right lower leg;Left lower leg;Back  Bathing assist         Upper Body Dressing/Undressing Upper body dressing   What is the patient wearing?: Hospital gown                Upper body assist         Lower Body Dressing/Undressing Lower body dressing   What is the patient wearing?: Non-skid slipper socks           Non-skid slipper socks- Performed by helper: Don/doff right sock;Don/doff left sock                  Lower body assist         Toileting Toileting Toileting activity did not occur: No continent bowel/bladder event        Toileting assist     Bed Mobility Roll left and right activity      Sit to lying activity      Lying to sitting activity   Assist level: Maximal assist (Pt 25 - 49%)  Mobility details Bed mobility details: Tactile cues for initiation;Tactile cues for weight shifting (Pt attempted to initiate bringing trunk forward from supine but needed extensive assist to complete)   Transfers Sit to stand  transfer   Sit to stand assist level: Maximal assist (Pt 25 - 49%/lift and lower)    Chair/bed transfer              Toilet transfer                Tub/shower transfer Tub/shower transfer activity did not occur: N/A;Safety/medical concerns           Cognition Comprehension Comprehension assist level: Understands basic less than 25% of the time/ requires cueing >75% of the time  Expression Expression assist level: Expresses basis less than 25% of the time/requires cueing >75% of the time.  Social Interaction Social Interaction assist level: Interacts appropriately less than 25% of the time. May be withdrawn or combative.   Problem Solving Problem solving assist level: Solves basic less than 25% of the time - needs direction nearly all the time or does not effectively solve problems and may need a restraint for safety  Memory      Refer to Care Plan for Long Term Goals  Recommendations for other services: None  Discharge Criteria: Patient will be discharged from OT if patient refuses treatment 3 consecutive times without medical reason, if treatment goals not met, if there is a change in medical status, if patient makes no progress towards goals or if patient is discharged from hospital.  The above assessment, treatment plan, treatment alternatives and goals were discussed and mutually agreed upon: No family available/patient unable  [facility] 11/26/2014, 12:49 PM

## 2014-11-26 NOTE — Progress Notes (Signed)
Social Work Assessment and Plan Social Work Assessment and Plan  Patient Details  Name: [name] MRN: [id number] Date of Birth: 01-Feb-1956  Today's Date: 11/26/2014  Problem List:  Patient Active Problem List   Diagnosis Date Noted  . Left middle cerebral artery stroke 11/25/2014  . Right hemiparesis 11/25/2014  . Aphasia due to stroke 11/25/2014  . Acute respiratory failure   . Cerebral infarction due to embolism of left middle cerebral artery   . Gastrostomy tube in place   . PEG (percutaneous endoscopic gastrostomy) status   . Respiratory distress   . DNAR (do not attempt resuscitation) 11/20/2014  . Paroxysmal a-fib 11/16/2014  . HLD (hyperlipidemia) 11/16/2014  . ESRD on dialysis 11/16/2014  . History of stroke 11/16/2014  . Essential hypertension   . PFO (patent foramen ovale)   . Acute respiratory failure with hypoxemia   . CVA (cerebral infarction) 11/10/2014  . Chronic ischemic colitis 09/25/2014  . Acute right ACA stroke 09/11/2014  . Left hemiparesis 09/11/2014  . History of ETT   . Duodenal fistula   . Sepsis   . Aortic aneurysm 08/13/2014  . Protein-calorie malnutrition   . Itching 08/12/2014  . Duodenal anastomotic leak   . Protein calorie malnutrition   . SVT (supraventricular tachycardia)   . Right heart failure   . ESRD (end stage renal disease)   . Abdominal pain   . Palliative care encounter   . 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 Fistula   . History of intermediate V/Q scan   . Other emphysema   . Intra-abdominal abscess   . Abnormal TSH   . Enteritis due to Clostridium difficile 07/19/2014  . Abdominal abscess   . Pelvic fluid collection   . Perforated gastric ulcer 07/01/2014  . SOB (shortness of breath)   . PJRT (permanent junctional reciprocating tachycardia)   . Paroxysmal atrial tachycardia   . PSVT (paroxysmal supraventricular tachycardia)   . Stroke   . Hyperlipidemia   . Cerebral thrombosis with cerebral  infarction 06/20/2014  . Hypertension 06/20/2014  . ESRD needing dialysis 06/20/2014  . Obesity (BMI 30-39.9) 06/20/2014  . Depression 06/20/2014  . Left renal mass 06/20/2014  . Chronic diastolic heart failure 06/20/2014  . H1N1 influenza 06/20/2014  . Tobacco use disorder 06/20/2014  . ARF (acute renal failure)   . Renal failure (ARF), acute on chronic   . Acute respiratory failure with hypoxia 06/17/2014  . End stage renal disease 01/21/2014   Past Medical History:  Past Medical History  Diagnosis Date  . Asthma   . Hypertension   . Gout   . Arthritis   . Insomnia   . Chronic kidney disease   . Depression   . Renal insufficiency   . Aortic aneurysm without rupture     3cm by CT 08/03/14  . Paroxysmal SVT (supraventricular tachycardia)   . Cor pulmonale   . Stroke 06/2014  . PFO (patent foramen ovale)   . COPD (chronic obstructive pulmonary disease)    Past Surgical History:  Past Surgical History  Procedure Laterality Date  . Multiple tooth extractions    . Av fistula placement Left 01/28/2014    Procedure: ARTERIOVENOUS (AV) FISTULA CREATION;  Surgeon: Sherren Kerns, MD;  Location: [facility] OR;  Service: Vascular;  Laterality: Left;  . Tee without cardioversion N/A 06/23/2014    Procedure: TRANSESOPHAGEAL ECHOCARDIOGRAM (TEE);  Surgeon: Lewayne Bunting, MD;  Location: [facility] ENDOSCOPY;  Service: Cardiovascular;  Laterality: N/A;  . Insertion  of dialysis catheter Right 06/27/2014    Procedure: INSERTION OF Right Internal Jugular DIATEK CATHETER;  Surgeon: Pryor Ochoa, MD;  Location: [facility] OR;  Service: Vascular;  Laterality: Right;  . Laparotomy N/A 07/01/2014    Procedure: EXPLORATORY LAPAROTOMY WITH CLOSURE OF PERFORATED PYLORIC ULCER;  Surgeon: Claud Kelp, MD;  Location: [facility] OR;  Service: General;  Laterality: N/A;  . Bowel resection N/A 07/01/2014    Procedure: SMALL BOWEL RESECTION;  Surgeon: Claud Kelp, MD;  Location: [facility] OR;  Service: General;  Laterality: N/A;  .  Revison of arteriovenous fistula Left 09/09/2014    Procedure: LIGATION OF COMPETING BRANCH, & RESECTION OF VENOUS ANEURYSM OF LEFT UPPER ARM ARTERIOVENOUS FISTULA;  Surgeon: Larina Earthly, MD;  Location: [facility] OR;  Service: Vascular;  Laterality: Left;   Social History:  reports that she has been smoking Cigarettes.  She has a 10 pack-year smoking history. She has never used smokeless tobacco. She reports that she drinks alcohol. She reports that she does not use illicit drugs.  Family / Support Systems Marital Status: Divorced Patient Roles: Parent Children: [name]-daughter [phone]-cell   [name]-son [phone] Other Supports: [name]-friend  [phone] Anticipated Caregiver: children and friends Ability/Limitations of Caregiver: daughter works 2;30-11;00 pm and son works 12;00-5;00 pm, can arrange 24 hr care Caregiver Availability: 24/7 Family Dynamics: Very close knit family just the three of them, but they care for one another and are very involved with one another. Children have been providing care since her discharge 7/1 from [facility].  They are very committed to one another.  Social History Preferred language: English Religion: Baptist Cultural Background: No issues Education: [employer] Read: Yes Write: Yes Employment Status: Disabled Fish farm manager Issues: No issues Guardian/Conservator: None-according to MD pt is not capable of mkaing her own decisions while here.  Since no formal POA will look toward her two children while here if any decisions need to be made.   Abuse/Neglect Physical Abuse: Denies Verbal Abuse: Denies Sexual Abuse: Denies Exploitation of patient/patient's resources: Denies Self-Neglect: Denies  Emotional Status Pt's affect, behavior adn adjustment status: Pt has always been one to take care of others and was very independent.  She was recovering from her previous stroke in May and was using her rollator rolling walker. She is very stubborn,  according to daughter and this worker attests to this from last admission. She is motivated to do as much as she can for herself. Recent Psychosocial Issues: Previous health issues-CVA 08/2014 and new ESRD, still has wound from surgery in March Pyschiatric History: Has history of depression and anxiety-tekse medicines for this. Unable to assess pt due to severity of this stroke. Will monitor and re-assess to see if would benefit from neuro-psych. She has speech deficits from this stroke, will monitor her participation and input from the team. Substance Abuse History: No issues  Patient / Family Perceptions, Expectations & Goals Pt/Family understanding of illness & functional limitations: Daughter can explain Mom's stroke and deficits, unfortuanately it is on her other side. Daughter does talk with the MD and feels her questions are being answered.  Daughter plans to be here to observe Mom in therapies, which worker encourages so can see her progress. Premorbid pt/family roles/activities: Mom, friend, church member, sister, etc Anticipated changes in roles/activities/participation: resume Pt/family expectations/goals: Daughter states: " I am hopeful she did so well last time here, I hope she will do so again.  She is stubborn and has a strong will."  Unable to assess pt  due to speech deficits. She does make eye contact and facial expressions.  Community Resources [employer]: Other (Comment) ([facility]-T, TH,Sat) Premorbid Home Care/DME Agencies: Other (Comment) ([facility] active with) Transportation available at discharge: family and friends Resource referrals recommended: Neuropsychology, Support group (specify)  Discharge Planning Living Arrangements: Children Support Systems: Children, Other relatives, Friends/neighbors, Psychologist, clinical community Type of Residence: Private residence Insurance Resources: Medicaid (specify county) Medical sales representative) Financial Resources: SSD, Family Youth worker  Screen Referred: No Living Expenses: Lives with family Money Management: Family Does the patient have any problems obtaining your medications?: No Home Management: Children Patient/Family Preliminary Plans: Return home with children providing 24 hr care, they have eben providing care since discharge from [facility] 10/02/2014. Familiar with the rehab process since was recently here. Pt somewhat discouraged by having to start over again, was doing so well before this happened. Social Work Anticipated Follow Up Needs: HH/OP, Support Group  Clinical Impression Unfortunate patient that was progressing well until had another stroke on her other side.  Well known to worker due to recent admit in [place] 2016. Very committed family-two children who have been providing Care to pt after her first stroke. Pt has more severe deficits this time and speech issues. Will assist with discharge planning and encouraged children to observe pt in therapies to be able to get a realistic Picture of her care. Await team's evaluations.  Lucy Chris 11/26/2014, 1:42 PM

## 2014-11-26 NOTE — Progress Notes (Signed)
Initial Nutrition Assessment  DOCUMENTATION CODES:   Obesity unspecified  INTERVENTION:   Increase Nepro formula to new goal rate of 60 ml/hr x 20 hours (can hold for 4 hours for therapy) via J-tube to provide 2160 kcal, 97 grams of protein, and 876 ml of free water.   RD to continue to monitor.  NUTRITION DIAGNOSIS:   Inadequate oral intake related to inability to eat as evidenced by NPO status.  GOAL:   Patient will meet greater than or equal to 90% of their needs  MONITOR:   Diet advancement, TF tolerance, Weight trends, Labs, I & O's  REASON FOR ASSESSMENT:   Consult Enteral/tube feeding initiation and management  ASSESSMENT:   59 y.o. 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  Presented 11/10/2014 with right-sided weakness and left gaze deviation. MRI of the brain showed moderate size left MCA territory infarct affecting the left insula and operculum.  Pt is familiar to this RD due to previous hospital admission. Per Epic weight records, pt with a 13% weight loss in 6 months. Pt has been receiving Nepro via J-tube at 50 ml/hr x 20 hours which has been providing 1800 kcal (90% of kcal needs), 81 grams of protein (85% of protein needs), and 730 ml of free water. RD to modify orders to fully meet nutrition needs. Per RN, pt has been tolerating her tube feeds with no other difficulties. Discussed with RN regarding new tube feeding orders. RD to continue to monitor.   Unable to complete Nutrition-Focused physical exam at this time.   Labs and medications reviewed.  Diet Order:  Diet NPO time specified  Skin:   (Generalized edema)  Last BM:  8/23  Height:   Ht Readings from Last 1 Encounters:  11/11/14 5' 7.01" (1.702 m)    Weight:   Wt  Readings from Last 1 Encounters:  11/26/14 195 lb 12.3 oz (88.8 kg)    Ideal Body Weight:  61 kg  BMI:  Body mass index is 30.65 kg/(m^2).  Estimated Nutritional Needs:   Kcal:  2000-2200  Protein:  95-115 grams  Fluid:  Per MD  EDUCATION NEEDS:   No education needs identified at this time  Roslyn Smiling, MS, RD, LDN Pager # [phone] After hours/ weekend pager # [phone]

## 2014-11-26 NOTE — Evaluation (Addendum)
Physical Therapy Assessment and Plan  Patient Details  Name: [name] MRN: [id number] Date of Birth: 12/31/1955  PT Diagnosis: Abnormal posture, Abnormality of gait, Cognitive deficits, Difficulty walking and Hemiparesis dominant Rehab Potential: Fair ELOS: 18-24 days   Today's Date: 11/26/2014 PT Individual Time: 1300-1405 PT Individual Time Calculation (min): 65 min    Problem List:  Patient Active Problem List   Diagnosis Date Noted  . Left middle cerebral artery stroke 11/25/2014  . Right hemiparesis 11/25/2014  . Aphasia due to stroke 11/25/2014  . Acute respiratory failure   . Cerebral infarction due to embolism of left middle cerebral artery   . Gastrostomy tube in place   . PEG (percutaneous endoscopic gastrostomy) status   . Respiratory distress   . DNAR (do not attempt resuscitation) 11/20/2014  . Paroxysmal a-fib 11/16/2014  . HLD (hyperlipidemia) 11/16/2014  . ESRD on dialysis 11/16/2014  . History of stroke 11/16/2014  . Essential hypertension   . PFO (patent foramen ovale)   . Acute respiratory failure with hypoxemia   . CVA (cerebral infarction) 11/10/2014  . Chronic ischemic colitis 09/25/2014  . Acute right ACA stroke 09/11/2014  . Left hemiparesis 09/11/2014  . History of ETT   . Duodenal fistula   . Sepsis   . Aortic aneurysm 08/13/2014  . Protein-calorie malnutrition   . Itching 08/12/2014  . Duodenal anastomotic leak   . Protein calorie malnutrition   . SVT (supraventricular tachycardia)   . Right heart failure   . ESRD (end stage renal disease)   . Abdominal pain   . Palliative care encounter   . 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 Fistula   . History of intermediate V/Q scan   . Other emphysema   . Intra-abdominal abscess   . Abnormal TSH   . Enteritis due to Clostridium difficile 07/19/2014  . Abdominal abscess   . Pelvic fluid collection   . Perforated gastric ulcer 07/01/2014  . SOB (shortness of breath)   .  PJRT (permanent junctional reciprocating tachycardia)   . Paroxysmal atrial tachycardia   . PSVT (paroxysmal supraventricular tachycardia)   . Stroke   . Hyperlipidemia   . 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 Hypertension 06/20/2014  . ESRD needing dialysis 06/20/2014  . Obesity (BMI 30-39.9) 06/20/2014  . Depression 06/20/2014  . Left renal mass 06/20/2014  . Chronic diastolic heart failure 71/69/6789  . H1N1 influenza 06/20/2014  . Tobacco use disorder 06/20/2014  . ARF (acute renal failure)   . Renal failure (ARF), acute on chronic   . Acute respiratory failure with hypoxia 06/17/2014  . End stage renal disease 01/21/2014    Past Medical History:  Past Medical History  Diagnosis Date  . Asthma   . Hypertension   . Gout   . Arthritis   . Insomnia   . Chronic kidney disease   . Depression   . Renal insufficiency   . Aortic aneurysm without rupture     3cm by CT 08/03/14  . Paroxysmal SVT (supraventricular tachycardia)   . Cor pulmonale   . Stroke 06/2014  . PFO (patent foramen ovale)   . COPD (chronic obstructive pulmonary disease)    Past Surgical History:  Past Surgical History  Procedure Laterality Date  . Multiple tooth extractions    . Av fistula placement Left 01/28/2014    Procedure: ARTERIOVENOUS (AV) FISTULA CREATION;  Surgeon: Elam Dutch, MD;  Location: [place];  Service: Vascular;  Laterality: Left;  .  Tee without cardioversion N/A 06/23/2014    Procedure: TRANSESOPHAGEAL ECHOCARDIOGRAM (TEE);  Surgeon: Lelon Perla, MD;  Location: [place];  Service: Cardiovascular;  Laterality: N/A;  . Insertion of dialysis catheter Right 06/27/2014    Procedure: INSERTION OF Right Internal Jugular DIATEK CATHETER;  Surgeon: Mal Misty, MD;  Location: [place];  Service: Vascular;  Laterality: Right;  . Laparotomy N/A 07/01/2014    Procedure: EXPLORATORY LAPAROTOMY WITH CLOSURE OF PERFORATED PYLORIC ULCER;  Surgeon: Fanny Skates, MD;   Location: [place];  Service: General;  Laterality: N/A;  . Bowel resection N/A 07/01/2014    Procedure: SMALL BOWEL RESECTION;  Surgeon: Fanny Skates, MD;  Location: [place];  Service: General;  Laterality: N/A;  . Revison of arteriovenous fistula Left 09/09/2014    Procedure: LIGATION OF COMPETING BRANCH, & RESECTION OF VENOUS ANEURYSM OF LEFT UPPER ARM ARTERIOVENOUS FISTULA;  Surgeon: Rosetta Posner, MD;  Location: [facility] OR;  Service: Vascular;  Laterality: Left;    Assessment & Plan Clinical Impression: Patient is a 59 y.o. year old female with recent admission to the hospital on 11/10/2014 with right-sided weakness and left gaze deviation. Patient did require intubation and mechanical ventilation for acute respiratory failure and extubated 11/20/2014. MRI of the brain showed moderate size left MCA territory infarct affecting the left insula and operculum. Expected evolution of posterior right ACA and posterior left MCA infarcts seen in March. Patient transferred to CIR on 11/25/2014 .  Patient currently requires max with mobility secondary to muscle weakness and muscle joint tightness, decreased cardiorespiratoy endurance and decreased oxygen support, impaired timing and sequencing, abnormal tone, unbalanced muscle activation, motor apraxia, decreased coordination and decreased motor planning, decreased visual perceptual skills, decreased midline orientation, decreased attention to right, decreased motor planning and ideational apraxia, decreased initiation, decreased attention, decreased awareness, decreased problem solving, decreased safety awareness, decreased memory and delayed processing and decreased sitting balance, decreased standing balance, decreased postural control and decreased balance strategies.  Prior to hospitalization, patient was supervision with mobility and lived with Son, Daughter in a [place] home.  Home access is 0Other (comment), Level entry (Daughter reports level entrance, 12 stairs  to access second floor where pt bedroom is).  Patient will benefit from skilled PT intervention to maximize safe functional mobility, minimize fall risk and decrease caregiver burden for planned discharge home with 24 hour assist.  Anticipate patient will benefit from follow up [facility] at discharge.  PT - End of Session Activity Tolerance: Tolerates 30+ min activity with multiple rests Endurance Deficit: Yes Endurance Deficit Description: O2 dependent, dyspnea with mobility tasks PT Assessment Rehab Potential (ACUTE/IP ONLY): Fair Barriers to Discharge: Decreased caregiver support;Inaccessible home environment (Stairs to access bedroom) PT Patient demonstrates impairments in the following area(s): Balance;Perception;Safety;Endurance;Sensory;Motor PT Transfers Functional Problem(s): Bed Mobility;Bed to Chair;Car;Furniture PT Locomotion Functional Problem(s): Ambulation;Wheelchair Mobility;Stairs PT Plan PT Intensity: Minimum of 1-2 x/day ,45 to 90 minutes PT Frequency: 5 out of 7 days PT Duration Estimated Length of Stay: 24-28 days PT Treatment/Interventions: Ambulation/gait training;Balance/vestibular training;Cognitive remediation/compensation;Community reintegration;Disease management/prevention;Discharge planning;DME/adaptive equipment instruction;Functional mobility training;Neuromuscular re-education;Patient/family education;Psychosocial support;Stair training;Therapeutic Exercise;Therapeutic Activities;UE/LE Strength taining/ROM;UE/LE Coordination activities;Wheelchair propulsion/positioning;Visual/perceptual remediation/compensation PT Transfers Anticipated Outcome(s): minA PT Locomotion Anticipated Outcome(s): minA PT Recommendation Recommendations for Other Services: Speech consult Follow Up Recommendations: Home health PT Patient destination: Home Equipment Recommended: To be determined Equipment Details: Pt has rollator   Skilled Therapeutic Intervention Pt received seated in w/c  with no observable signs of pain, and appears willing to participate in treatment. Pt  non-verbal and does not respond to yes/no questions. Daughter present to provide social/function hx and home environment. Initial PT evaluation performed and completed. Refer to information below for details and amount of assist provided. Pt returned to room and transferred w/c >bed maxA +1; sit >supine maxA +2 with decreased pt initiation. Pt remained supine in bed with daughter present, alarm intact and soft call bell and phone within reach.  PT Evaluation Precautions/Restrictions Precautions Precautions: Fall Precaution Comments: fall, NPO, pusher to the right at times, non-verbal,  Restrictions Weight Bearing Restrictions: No General Chart Reviewed: Yes PT Amount of Missed Time (min): 25 Minutes PT Missed Treatment Reason: Patient fatigue Response to Previous Treatment: Other (Comment) (Pt unable to report, daughter reports pt appears fatigued and had been wanting to take a nap) Family/Caregiver Present: Yes (Daughter) Vital SignsTherapy Vitals Temp: 98.6 F (37 C) Temp Source: Axillary Pulse Rate: (!) 106 Resp: 20 BP: (!) 128/91 mmHg Patient Position (if appropriate): Sitting Oxygen Therapy SpO2: 97 % O2 Device: Nasal Cannula O2 Flow Rate (L/min): 6 L/min Pain Pain Assessment Pain Assessment: Faces Faces Pain Scale: No hurt PAINAD (Pain Assessment in Advanced Dementia) Breathing: normal Negative Vocalization: none Facial Expression: smiling or inexpressive Body Language: relaxed Consolability: no need to console PAINAD Score: 0 Home Living/Prior Functioning Home Living Living Arrangements: Children Available Help at Discharge: Family;Available 24 hours/day Type of Home: Apartment Home Access: Other (comment);Level entry (Daughter reports level entrance, 12 stairs to access second floor where pt bedroom is) Entrance Stairs-Number of Steps: 0 Home Layout: Two level Alternate Level  Stairs-Number of Steps: 12 Alternate Level Stairs-Rails: Right;Left Bathroom Shower/Tub: Chiropodist: Standard Bathroom Accessibility: Yes Additional Comments: Unable to determine readiness of children to provide care for pt at d/c  Lives With: Son;Daughter Prior Function Level of Independence: Requires assistive device for independence;Needs assistance with homemaking  Able to Take Stairs?: Yes Driving: No Vocation: Unemployed Comments: According to chart 2 months ago, family was assisting with meals and ensuring pt take her proper medication & bill managment. Vision/Perception  Vision - Assessment Eye Alignment: Within Functional Limits Ocular Range of Motion: Impaired-to be further tested in functional context Alignment/Gaze Preference: Chin down;Head turned;Gaze left Tracking/Visual Pursuits: Impaired - to be further tested in functional context Additional Comments: Decreased scanning to R side, avoided gaze at therapist on R side even with repetitive verbal/visual cues Praxis Praxis-Other Comments: pt able to initiate reaching for washcloth, other simple automatic responses, however hand over hand assist for most tasks  Cognition Overall Cognitive Status: Impaired/Different from baseline Arousal/Alertness: Awake/alert Orientation Level: Oriented to person Attention: Focused Focused Attention: Impaired Focused Attention Impairment: Functional basic Memory:  (unable to assess due to global aphasia, suspect impaired due to receptive deficits ) Awareness: Impaired Awareness Impairment: Intellectual impairment Problem Solving: Impaired Problem Solving Impairment: Functional basic;Verbal basic Behaviors: Lability Safety/Judgment: Impaired Comments: decreased initiation, right inattention, unable to follow most 1 step comnands without max to total cues Sensation Sensation Light Touch: Not tested Stereognosis: Not tested Hot/Cold: Not tested Proprioception:  Not tested Additional Comments: Unable to accurately asses on evaluation Coordination Gross Motor Movements are Fluid and Coordinated: No Fine Motor Movements are Fluid and Coordinated: No Coordination and Movement Description: Unable to formally assess due to apraxia and global aphasia; noted poor coordination when removing B feet from leg rests Finger Nose Finger Test: NT Heel Shin Test: NT Motor  Motor Motor: Hemiplegia;Motor apraxia;Motor impersistence Motor - Skilled Clinical Observations: Decreased functional movement in RUE/RLE. R lateral trunk  lean, mild pushing with L extremities  Mobility Bed Mobility Bed Mobility: Sit to Supine Sit to Supine: 1: +2 Total assist Sit to Supine: Patient Percentage: 10% Sit to Supine - Details: Visual cues/gestures for sequencing;Tactile cues for weight beaing;Tactile cues for posture;Tactile cues for weight shifting;Tactile cues for initiation;Tactile cues for sequencing Transfers Transfers: Yes Stand Pivot Transfers: 2: Max assist Stand Pivot Transfer Details: Tactile cues for initiation;Tactile cues for sequencing;Tactile cues for weight beaing;Tactile cues for posture;Tactile cues for weight shifting;Visual cues/gestures for sequencing Stand Pivot Transfer Details (indicate cue type and reason): visual cue for initiating sit >stand, visual/auditory cue to indicate where pt transferring to, manual facilitation of weight shifting and turning Locomotion  Ambulation Ambulation: No (Performed sit<>stand x3 in hallway with L handrail, however pt unable to initiate step even with manual facilitation of weight shifting, multimodal cues) Gait Gait: No Wheelchair Mobility Wheelchair Mobility: Yes Wheelchair Assistance: 1: +1 Total assist (Hand-over-hand facilitation of UE propulsion, however no observable assistance provided by pt) Wheelchair Assistance Details: Tactile cues for placement;Tactile cues for sequencing;Tactile cues for  initiation Wheelchair Propulsion: Both upper extremities Wheelchair Parts Management: Needs assistance Distance: 0  Trunk/Postural Assessment  Cervical Assessment Cervical Assessment: Exceptions to [facility] (cervical flexion and L rotation) Thoracic Assessment Thoracic Assessment: Within Functional Limits Lumbar Assessment Lumbar Assessment: Within Functional Limits Postural Control Postural Control: Deficits on evaluation Righting Reactions: Pt with increased right side trunk elongation as well as maintaining posterior pelvic tilt.  Balance Balance Balance Assessed: Yes Static Sitting Balance Static Sitting - Balance Support: No upper extremity supported Static Sitting - Level of Assistance: 2: Max assist (Seated on EOB pt initially min/modA however reduced to maxA with time and pt inattention) Static Sitting - Comment/# of Minutes: x3 min Dynamic Sitting Balance Dynamic Sitting - Balance Support: Feet supported Dynamic Sitting - Level of Assistance: 2: Max assist Static Standing Balance Static Standing - Balance Support: Left upper extremity supported Static Standing - Level of Assistance: 3: Mod assist Static Standing - Comment/# of Minutes: x2 min, L hand on handrail in hallway Dynamic Standing Balance Dynamic Standing - Level of Assistance: 2: Max assist (unable to assess) Extremity Assessment  RUE Assessment RUE Assessment: Exceptions to [facility] RUE Strength RUE Overall Strength Comments: Defer to OT exam LUE Assessment LUE Assessment: Exceptions to [facility] LUE Strength LUE Overall Strength Comments: Defer to OT exam RLE Assessment RLE Assessment: Exceptions to [facility] RLE Strength RLE Overall Strength: Deficits;Due to impaired cognition RLE Overall Strength Comments: Unable to formally assess; grossly 4-/5 as determined by functional movements, sit <>stand. 5 beat clonus. LLE Assessment LLE Assessment: Exceptions to [facility] LLE Strength LLE Overall Strength: Due to impaired  cognition;Deficits LLE Overall Strength Comments: Unable to formally assess; grossly 4-/5 as determined by functional movements, sit <>stand  Function: Toileting Toileting             Bed Mobility Roll left and right activity   Assist level: Total assist (Pt < 25%) Roll left and right assistive device: Bedrails  Sit to lying activity   Assist level: 2 helpers    Lying to sitting activity        Mobility details Bed mobility details: Tactile cues for initiation;Tactile cues for weight shifting   Transfers Sit to stand transfer   Sit to stand assist level: Maximal assist (Pt 25 - 49%/lift and lower)    Chair/bed transfer   Chair/bed transfer method: Stand pivot Chair/bed transfer assist level: Maximal assist (Pt 25 - 49%/lift and lower)  Chair/bed transfer details: Visual cues/gestures for sequencing;Tactile cues for posture;Tactile cues for initiation;Tactile cues for weight shifting;Tactile cues for weight bearing   Mudlogger transfer activity did not occur: Safety/medical concerns        Locomotion Ambulation Ambulation activity did not occur: Safety/medical concerns (Attempted ambulation, however upon standing x3 reps pt unable to step (unable to determine if due to$Remove' \\[id number]'[id number]$  or pt fearful))        Walk 10 feet activity Walk 10 feet activity did not occur: Safety/medical concerns    Walk 50 feet with 2 turns activity Walk 50 feet with 2 turns activity did not occur: Safety/medical concerns    Walk 150 feet activity Walk 150 feet activity did not occur: Safety/medical concerns    Walk 10 feet on uneven surfaces activity Walk 10 feet on uneven surfaces activity did not occur: Safety/medical concerns    Stairs Stairs activity did not occur: Safety/medical concerns        Walk up/down 1 step activity Walk up/down 1 step or curb (drop down) activity did not occur: Safety/medical concerns Stairs activity did not occur:  Safety/medical concerns    Walk up/down 4 steps activity Walk up/down 4 steps activity did not occur: Safety/medical concerns    Walk up/down 12 steps activity Walk up/down 12 steps activity did not occur: Safety/medical concerns    Pick up small objects from floor Pick up small object from the floor (from standing position) activity did not occur: Safety/medical concerns    Wheelchair Wheelchair activity did not occur:  (Attempted hand-over-hand facilitation for w/c propulsion, visual demonstration, however no pt initiation evident)     Assist Level: Dependent (Pt equals 0%)  Wheel 50 feet with 2 turns activity      Wheel 150 feet activity       Cognition Comprehension Comprehension assist level: Understands basic less than 25% of the time/ requires cueing >75% of the time  Expression Expression assist level: Expresses basis less than 25% of the time/requires cueing >75% of the time.  Social Interaction Social Interaction assist level: Interacts appropriately less than 25% of the time. May be withdrawn or combative.  Problem Solving Problem solving assist level: Solves basic less than 25% of the time - needs direction nearly all the time or does not effectively solve problems and may need a restraint for safety  Memory Memory assist level: Recognizes or recalls less than 25% of the time/requires cueing greater than 75% of the time    Refer to Care Plan for Long Term Goals  Recommendations for other services: None  Discharge Criteria: Patient will be discharged from PT if patient refuses treatment 3 consecutive times without medical reason, if treatment goals not met, if there is a change in medical status, if patient makes no progress towards goals or if patient is discharged from hospital.  The above assessment, treatment plan, treatment alternatives and goals were discussed and mutually agreed upon: by family  Luberta Mutter 11/26/2014, 3:43 PM

## 2014-11-26 NOTE — Evaluation (Signed)
Speech Language Pathology Assessment and Plan  Patient Details  Name: [name] MRN: [id number] Date of Birth: 06-05-1955  SLP Diagnosis: Aphasia;Apraxia;Cognitive Impairments;Dysphagia  Rehab Potential: Fair ELOS: 21-28 days     Today's Date: 11/26/2014 SLP Individual Time: 1102-1205 SLP Individual Time Calculation (min): 63 min   Problem List:  Patient Active Problem List   Diagnosis Date Noted  . Left middle cerebral artery stroke 11/25/2014  . Right hemiparesis 11/25/2014  . Aphasia due to stroke 11/25/2014  . Acute respiratory failure   . Cerebral infarction due to embolism of left middle cerebral artery   . Gastrostomy tube in place   . PEG (percutaneous endoscopic gastrostomy) status   . Respiratory distress   . DNAR (do not attempt resuscitation) 11/20/2014  . Paroxysmal a-fib 11/16/2014  . HLD (hyperlipidemia) 11/16/2014  . ESRD on dialysis 11/16/2014  . History of stroke 11/16/2014  . Essential hypertension   . PFO (patent foramen ovale)   . Acute respiratory failure with hypoxemia   . CVA (cerebral infarction) 11/10/2014  . Chronic ischemic colitis 09/25/2014  . Acute right ACA stroke 09/11/2014  . Left hemiparesis 09/11/2014  . History of ETT   . Duodenal fistula   . Sepsis   . Aortic aneurysm 08/13/2014  . Protein-calorie malnutrition   . Itching 08/12/2014  . Duodenal anastomotic leak   . Protein calorie malnutrition   . SVT (supraventricular tachycardia)   . Right heart failure   . ESRD (end stage renal disease)   . Abdominal pain   . Palliative care encounter   . 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 Fistula   . History of intermediate V/Q scan   . Other emphysema   . Intra-abdominal abscess   . Abnormal TSH   . Enteritis due to Clostridium difficile 07/19/2014  . Abdominal abscess   . Pelvic fluid collection   . Perforated gastric ulcer 07/01/2014  . SOB (shortness of breath)   . PJRT (permanent junctional reciprocating  tachycardia)   . Paroxysmal atrial tachycardia   . PSVT (paroxysmal supraventricular tachycardia)   . Stroke   . Hyperlipidemia   . 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 Hypertension 06/20/2014  . ESRD needing dialysis 06/20/2014  . Obesity (BMI 30-39.9) 06/20/2014  . Depression 06/20/2014  . Left renal mass 06/20/2014  . Chronic diastolic heart failure 68/02/5725  . H1N1 influenza 06/20/2014  . Tobacco use disorder 06/20/2014  . ARF (acute renal failure)   . Renal failure (ARF), acute on chronic   . Acute respiratory failure with hypoxia 06/17/2014  . End stage renal disease 01/21/2014   Past Medical History:  Past Medical History  Diagnosis Date  . Asthma   . Hypertension   . Gout   . Arthritis   . Insomnia   . Chronic kidney disease   . Depression   . Renal insufficiency   . Aortic aneurysm without rupture     3cm by CT 08/03/14  . Paroxysmal SVT (supraventricular tachycardia)   . Cor pulmonale   . Stroke 06/2014  . PFO (patent foramen ovale)   . COPD (chronic obstructive pulmonary disease)    Past Surgical History:  Past Surgical History  Procedure Laterality Date  . Multiple tooth extractions    . Av fistula placement Left 01/28/2014    Procedure: ARTERIOVENOUS (AV) FISTULA CREATION;  Surgeon: Elam Dutch, MD;  Location: [place];  Service: Vascular;  Laterality: Left;  . Tee without cardioversion N/A 06/23/2014  Procedure: TRANSESOPHAGEAL ECHOCARDIOGRAM (TEE);  Surgeon: Lelon Perla, MD;  Location: [place];  Service: Cardiovascular;  Laterality: N/A;  . Insertion of dialysis catheter Right 06/27/2014    Procedure: INSERTION OF Right Internal Jugular DIATEK CATHETER;  Surgeon: Mal Misty, MD;  Location: [place];  Service: Vascular;  Laterality: Right;  . Laparotomy N/A 07/01/2014    Procedure: EXPLORATORY LAPAROTOMY WITH CLOSURE OF PERFORATED PYLORIC ULCER;  Surgeon: Fanny Skates, MD;  Location: [place];  Service: General;   Laterality: N/A;  . Bowel resection N/A 07/01/2014    Procedure: SMALL BOWEL RESECTION;  Surgeon: Fanny Skates, MD;  Location: [place];  Service: General;  Laterality: N/A;  . Revison of arteriovenous fistula Left 09/09/2014    Procedure: LIGATION OF COMPETING BRANCH, & RESECTION OF VENOUS ANEURYSM OF LEFT UPPER ARM ARTERIOVENOUS FISTULA;  Surgeon: Rosetta Posner, MD;  Location: [place];  Service: Vascular;  Laterality: Left;    Assessment / Plan / Recommendation Clinical Impression   [name] is a 59 y.o. handed female with history of asthma, end-stage renal disease with hemodialysis, right anterior cerebral artery infarct May 2016, status post gastrostomy tube 08/12/2014 due to exploratory laparotomy for massive pneumoperitoneum repair of perforated pyloric ulcer March 2016. Presented 11/10/2014 with right-sided weakness and left gaze deviation. Patient did require intubation and mechanical ventilation for acute respiratory failure and extubated 11/20/2014. MRI of the brain showed moderate size left MCA territory infarct.  EEG consistent with toxic metabolic encephalopathy and no seizure activity noted as she continued on Keppra for seizure prophylaxis.Patient currently nothing by mouth with PEG tube feeds for nutritional support. Patient was admitted for comprehensive rehabilitation program on 11/25/2014.  SLP evaluation completed on 11/26/2014 with the following results:  Pt presents with s/s of a severe oropharyngeal dysphagia characterized by decreased management of her secretions as evidenced by wet, congested cough and poor anterior containment of saliva.  Pt was able to orally accept 4 teaspoons of ice chips and one 1/4 teaspoon of purees but demonstrated poor awareness and manipulation of boluses which eventually required suctioning in 3 out of 5 attempts to remove materials from the oral cavity.  During the two palpable swallows which pt was able to initiate, pt demonstrated immediate coughing and  throat clearing indicative of compromised airway protection.  Recommend that pt remain NPO with alternative means of nutrition.   Additionally, pt presents with a severe global aphasia with oral apraxia and remained nonverbal throughout today's evaluation.  Pt was able to receptively identify basic, familiar objects from a field of two with max assist multimodal cues for initiation but was otherwise unable to answer yes/no questions or follow basic 1-step commands without hand over hand assist.  Cognition was difficult to formally assess due to the pt's extensive communication impairments; however, pt presents with decreased initiation likely related to motor apraxia and right inattention, both of which impact her ability to complete basic, familiar tasks even in a highly structured context.    Pt has experienced a significant loss of function as a result of her CVA and would benefit from skilled ST while inpatient in order to maximize functional independence and reduce burden of care prior to discharge.  Anticipate that pt will need extensive 24/7 supervision, assistance for basic self care and/or home management tasks, and ST follow up at next level of care.   Skilled Therapeutic Interventions          Cognitive-linguistic and bedside swallowing evaluation completed with results and  recommendations reviewed with family.  Pt's daughter updated regarding rationale behind continued NPO status and provided with strategies to maximize pt's functional communication for conveying her needs/wants.       SLP Assessment  Patient will need skilled [facility] during CIR admission    Recommendations  SLP Diet Recommendations: NPO Medication Administration: Via alternative means Oral Care Recommendations: Oral care QID Patient destination: Home (versus SNF ) Follow up Recommendations: Home Health SLP;Outpatient SLP;Skilled Nursing facility;24 hour supervision/assistance    SLP Frequency  3 to 5 out of 7 days   SLP Treatment/Interventions Cognitive remediation/compensation;Cueing hierarchy;Functional tasks;Multimodal communication approach;Patient/family education;Internal/external aids;Speech/Language facilitation;Dysphagia/aspiration precaution training;Environmental controls   Pain Pain Assessment Pain Assessment: Faces Faces Pain Scale: No hurt Prior Functioning Cognitive/Linguistic Baseline: Within functional limits Type of Home: Apartment  Lives With: Son;Daughter Available Help at Discharge: Family;Available 24 hours/day  Function:  Eating Eating     Eating Assist Level: Helper feeds patient (with trials )           Cognition Comprehension Comprehension assist level: Understands basic less than 25% of the time/ requires cueing >75% of the time  Expression   Expression assist level: Expresses basis less than 25% of the time/requires cueing >75% of the time.  Social Interaction Social Interaction assist level: Interacts appropriately less than 25% of the time. May be withdrawn or combative.  Problem Solving Problem solving assist level: Solves basic less than 25% of the time - needs direction nearly all the time or does not effectively solve problems and may need a restraint for safety  Memory Memory assist level: Recognizes or recalls less than 25% of the time/requires cueing greater than 75% of the time   Short Term Goals: Week 1: SLP Short Term Goal 1 (Week 1): Pt will improve management of her secretions as evidenced by ability to orally expectorate secretions and anteriorly contain her saliva with max assist multimodal cues.   SLP Short Term Goal 2 (Week 1): Pt will consume PO trials with max assist multimodal cues to clear boluses from the oral cavity and to initiate swallow in a timely manner over 3 consecutive sessions prior to completion of objective swallow study.  SLP Short Term Goal 3 (Week 1): Pt will initiate a basic,familiar task with max assist  multimodal cues over 50% of observable opportunities.  SLP Short Term Goal 4 (Week 1): Pt will visually scan to midline in >25% of observable opportunities during basic, functional tasks with max assist multimodal cues.  SLP Short Term Goal 5 (Week 1): Pt will vocalize to produce vowels in isolation with max assist multimodal cues.    Refer to Care Plan for Long Term Goals  Recommendations for other services: None  Discharge Criteria: Patient will be discharged from SLP if patient refuses treatment 3 consecutive times without medical reason, if treatment goals not met, if there is a change in medical status, if patient makes no progress towards goals or if patient is discharged from hospital.  The above assessment, treatment plan, treatment alternatives and goals were discussed and mutually agreed upon: by patient and by family  Emilio Math 11/26/2014, 3:33 PM

## 2014-11-26 NOTE — Progress Notes (Signed)
Patient's abdominal incision from 07/01/14 from exploratory laparotomy/small bowel resection has fowl smell, large chunks flaky/scabbed skin.  No visible opening; no drainage. RN concerned for area and proper healing.  Will continue to monitor.

## 2014-11-26 NOTE — Progress Notes (Signed)
59 y.o. 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 Interventional radiology, received inpatient rehabilitation services maintain on aspirin 81 mg for CVA prophylaxis. She was discharged to home with family ambulating short household distances using a rolling walker and minimal assist to contact-guard. Presented 11/10/2014 with right-sided weakness and left gaze deviation. Patient did require intubation and mechanical ventilation for acute respiratory failure and extubated 11/20/2014. MRI of the brain showed moderate size left MCA territory infarct affecting the left insula and operculum. Expected evolution of posterior right ACA and posterior left MCA infarcts seen in March. Patient did receive TPA. Echocardiogram with ejection fraction of 65% no wall motion abnormalities. EEG consistent with toxic metabolic encephalopathy and no seizure activity noted as she continued on Keppra for seizure prophylaxis. CT angiogram head and neck shows no large vessel proximal occlusion. Neurology consulted aspirin increased to 325 mg daily. Hemodialysis ongoing as per renal services. Patient currently nothing by mouth with PEG tube feeds for nutritional support  Subjective/Complaints: Pt with severe aphasia  ROS cannot obtain due to aphasia  Objective: Vital Signs: Blood pressure 106/76, pulse 100, temperature 98.4 F (36.9 C), temperature source Oral, resp. rate 19, weight 88.8 kg (195 lb 12.3 oz), SpO2 97 %. Dg Chest Port 1 View  11/24/2014   CLINICAL DATA:  Respiratory distress for 1 day.  EXAM: PORTABLE CHEST - 1 VIEW  COMPARISON:  11/21/2014  FINDINGS: Dual lumen central venous catheter is stable in appearance.  Cardiomediastinal silhouette is stably enlarged. Mediastinal contours appear  intact.  There is interval improvement in the previously noted left lower lobe airspace consolidation. There is no evidence of pleural effusion or pneumothorax.  Osseous structures are without acute abnormality. Soft tissues are grossly normal.  IMPRESSION: Partial improvement of the left lower lobe airspace consolidation.  Enlarged cardiac silhouette.   Electronically Signed   By: Ted Mcalpine M.D.   On: 11/24/2014 15:24   Results for orders placed or performed during the hospital encounter of 11/25/14 (from the past 72 hour(s))  Glucose, capillary     Status: Abnormal   Collection Time: 11/26/14  1:22 AM  Result Value Ref Range   Glucose-Capillary 102 (H) 65 - 99 mg/dL  Glucose, capillary     Status: Abnormal   Collection Time: 11/26/14  6:42 AM  Result Value Ref Range   Glucose-Capillary 118 (H) 65 - 99 mg/dL     HEENT: poor oral movements  facial diplegia Cardio: RRR and no murmur Resp: CTA B/L and unlabored GI: BS positive and NT, ND, pior miline abd incision with extensive scarring Extremity:  Pulses positive and No Edema Skin:   Other G tube site clean Neuro: Flat, Cranial Nerve Abnormalities bilateral central 7, Abnormal Sensory reduced withdrawl to pinch in all 4 ext, Abnormal Motor 0/5 in RUE, 2- LUE, trace R and left knee ext, Abnormal FMC Tone  increased ext tone in BLE, increased flexor tone in RUE, Aphasic and Apraxic Musc/Skel:  Other decrease ROM in BIlateral knees and Right shoulder and elbow due to tone Gen NAD   Assessment/Plan: 1. 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 Physiatrist is providing close team supervision and 24 hour  management of active medical problems listed below. Physiatrist and rehab team continue to assess barriers to  discharge/monitor patient progress toward functional and medical goals. FIM:       Function - Toileting Toileting activity did not occur: N/A  Function - Archivist transfer activity did not occur: Safety/medical concerns        Function - Comprehension Comprehension: Auditory, Visual Comprehension assist level: Other (comment) (UTA; pt nonverbal)  Function - Expression Expression: Nonverbal Expression assist level: Expresses basis less than 25% of the time/requires cueing >75% of the time.  Function - Social Interaction Social Interaction assist level: Interacts appropriately 25 - 49% of time - Needs frequent redirection.  Function - Problem Solving Problem solving assist level: Solves basic less than 25% of the time - needs direction nearly all the time or does not effectively solve problems and may need a restraint for safety  Function - Memory Memory assist level: Recognizes or recalls less than 25% of the time/requires cueing greater than 75% of the time Patient normally able to recall (first 3 days only): None of the above  Medical Problem List and Plan: 1. Functional deficits secondary to left MCA infarct/respiratory failure. Extubated 11/20/2014. Continue nebulizer treatments as advised 2. DVT Prophylaxis/Anticoagulation: SCDs. Monitor for any signs of DVT. Check venous dopplers 3. Pain Management: Tylenol as needed 4. Dysphagia. Patient is nothing by mouth. Status post gastrostomy tube 08/12/2014 per interventional radiology for nutritional support. poor prognosis for improvement given severe bilateral deficits 5. Neuropsych: This patient is not capable of making decisions on her own behalf. 6. Skin/Wound Care: Routine skin checks. PEG site maintenance. Proper turning and nutrition 7. Fluids/Electrolytes/Nutrition: Routine I&O. Adjust TF based on nutritional needs. Follow labs. 8. End-stage renal disease with hemodialysis. Follow-up per renal  services. Hemodialysis in PM after therapies to avoid conflict 9. Seizure prophylaxis. Keppra 500 mg twice a day, monitor drug toxicity as well as for seizure 10. Hypothyroidism. Synthroid 11. Chronic anemia. Aranesp weekly with dialysis 12. History of asthma. Continue nebulizers as needed. Anxiety component also.  13. Mood/anxiety. Continue Xanax on dialysis days 14.  Hx of recurrent resp failure, high risk for PNA given comorbid conditions LOS (Days) 1 A FACE TO FACE EVALUATION WAS PERFORMED  KIRSTEINS,ANDREW E 11/26/2014, 8:41 AM

## 2014-11-27 ENCOUNTER — Inpatient Hospital Stay (HOSPITAL_COMMUNITY): Payer: Medicaid Other | Admitting: Occupational Therapy

## 2014-11-27 ENCOUNTER — Inpatient Hospital Stay (HOSPITAL_COMMUNITY): Payer: Medicaid Other | Admitting: Speech Pathology

## 2014-11-27 ENCOUNTER — Inpatient Hospital Stay (HOSPITAL_COMMUNITY): Payer: Medicaid Other

## 2014-11-27 LAB — RENAL FUNCTION PANEL
ALBUMIN: 2.4 g/dL — AB (ref 3.5–5.0)
Anion gap: 15 (ref 5–15)
BUN: 79 mg/dL — ABNORMAL HIGH (ref 6–20)
CHLORIDE: 99 mmol/L — AB (ref 101–111)
CO2: 27 mmol/L (ref 22–32)
CREATININE: 7.47 mg/dL — AB (ref 0.44–1.00)
Calcium: 8.8 mg/dL — ABNORMAL LOW (ref 8.9–10.3)
GFR, EST AFRICAN AMERICAN: 6 mL/min — AB (ref >60)
GFR, EST NON AFRICAN AMERICAN: 5 mL/min — AB (ref >60)
Glucose, Bld: 75 mg/dL (ref 65–99)
PHOSPHORUS: 4 mg/dL (ref 2.5–4.6)
POTASSIUM: 3.8 mmol/L (ref 3.5–5.1)
Sodium: 141 mmol/L (ref 135–145)

## 2014-11-27 LAB — CBC
HEMATOCRIT: 28.5 % — AB (ref 36.0–46.0)
Hemoglobin: 9.1 g/dL — ABNORMAL LOW (ref 12.0–15.0)
MCH: 27.5 pg (ref 26.0–34.0)
MCHC: 31.9 g/dL (ref 30.0–36.0)
MCV: 86.1 fL (ref 78.0–100.0)
Platelets: 326 10*3/uL (ref 150–400)
RBC: 3.31 MIL/uL — AB (ref 3.87–5.11)
RDW: 18.4 % — ABNORMAL HIGH (ref 11.5–15.5)
WBC: 7.6 10*3/uL (ref 4.0–10.5)

## 2014-11-27 LAB — GLUCOSE, CAPILLARY
GLUCOSE-CAPILLARY: 102 mg/dL — AB (ref 65–99)
GLUCOSE-CAPILLARY: 61 mg/dL — AB (ref 65–99)
GLUCOSE-CAPILLARY: 69 mg/dL (ref 65–99)
GLUCOSE-CAPILLARY: 80 mg/dL (ref 65–99)
GLUCOSE-CAPILLARY: 94 mg/dL (ref 65–99)
Glucose-Capillary: 105 mg/dL — ABNORMAL HIGH (ref 65–99)

## 2014-11-27 LAB — CBC WITH DIFFERENTIAL/PLATELET
BASOS PCT: 0 % (ref 0–1)
Basophils Absolute: 0 10*3/uL (ref 0.0–0.1)
EOS ABS: 0.3 10*3/uL (ref 0.0–0.7)
EOS PCT: 4 % (ref 0–5)
HCT: 35.7 % — ABNORMAL LOW (ref 36.0–46.0)
HEMOGLOBIN: 11.3 g/dL — AB (ref 12.0–15.0)
LYMPHS ABS: 1 10*3/uL (ref 0.7–4.0)
Lymphocytes Relative: 14 % (ref 12–46)
MCH: 28 pg (ref 26.0–34.0)
MCHC: 31.7 g/dL (ref 30.0–36.0)
MCV: 88.6 fL (ref 78.0–100.0)
MONOS PCT: 18 % — AB (ref 3–12)
Monocytes Absolute: 1.3 10*3/uL — ABNORMAL HIGH (ref 0.1–1.0)
NEUTROS PCT: 64 % (ref 43–77)
Neutro Abs: 4.3 10*3/uL (ref 1.7–7.7)
PLATELETS: 301 10*3/uL (ref 150–400)
RBC: 4.03 MIL/uL (ref 3.87–5.11)
RDW: 18.4 % — ABNORMAL HIGH (ref 11.5–15.5)
WBC: 6.8 10*3/uL (ref 4.0–10.5)

## 2014-11-27 MED ORDER — LEVETIRACETAM 100 MG/ML PO SOLN
500.0000 mg | Freq: Two times a day (BID) | ORAL | Status: DC
  Administered 2014-11-27 – 2014-12-24 (×55): 500 mg via ORAL
  Filled 2014-11-27 (×55): qty 5

## 2014-11-27 NOTE — Progress Notes (Signed)
Physical Therapy Session Note  Patient Details  Name: [name] MRN: [id number] Date of Birth: Aug 20, 1955  Today's Date: 11/27/2014 PT Individual Time: 0800-0825 PT Individual Time Calculation (min): 25 min   Short Term Goals: Week 1:  PT Short Term Goal 1 (Week 1): Will perform bed mobility with modA and use of bedrails PT Short Term Goal 2 (Week 1): Will perform supine <>sit maxA +1 and mod cues PT Short Term Goal 3 (Week 1): Will perform stand pivot transfer modA PT Short Term Goal 4 (Week 1): Will perform ambulation x25' with LRAD and maxA +1 PT Short Term Goal 5 (Week 1): Will perform w/c propulsion x50' with modA  Skilled Therapeutic Interventions/Progress Updates:    Pt received supine in bed; observable pain symptoms as described below. Noted labored breathing; vitals assessed as below. Supine >sit maxA with HOB elevated. Seated on EOB x4 min while donning second gown and preparing lines/leads for transfer; noted R lateral trunk lean with L extremities pushing, increased over evaluation yesterday. Decreased initiation when pt cued with verbal/visual cues for sit >stand, and when facilitated to stand. Required +2 to stand pivot to w/c with facilitation for weight shifting and assistance with LE placement. Upon reaching seated position in w/c, staff arrived to transport pt to chest xray. Squat pivot w/c > bed with modA and improved pt initiation of transfer. Sit >supine maxA +2. Pt remained supine in bed with handoff to transport staff. Vitals assessed after returning to supine; O2 100% and HR 111. Overall noted decreased pt initiation and participation in therapy, decline in postural control as compared to evaluation yesterday.    Therapy Documentation Precautions:  Precautions Precautions: Fall Precaution Comments: fall, NPO, pusher to the right at times, non-verbal,  Restrictions Weight Bearing Restrictions: No General: PT Amount of Missed Time (min): 35 Minutes PT Missed  Treatment Reason: Xray Vital Signs: Therapy Vitals Pulse Rate: (!) 112 Patient Position (if appropriate): Lying Oxygen Therapy SpO2: 100 % O2 Device: Nasal Cannula O2 Flow Rate (L/min): 6 L/min Pain: Pain Assessment Pain Assessment: Faces Faces Pain Scale: No hurt PAINAD (Pain Assessment in Advanced Dementia) Breathing: occasional labored breathing, short period of hyperventilation Negative Vocalization: none Facial Expression: smiling or inexpressive Body Language: relaxed Consolability: no need to console PAINAD Score: 1   Function:  Bed Mobility Roll left and right activity        Sit to lying activity   Assist level: 2 helpers    Lying to sitting activity   Assist level: Maximal assist (Pt 25 - 49%)    Mobility details Bed mobility details: Tactile cues for initiation;Tactile cues for weight shifting   Transfers Sit to stand transfer   Sit to stand assist level: Maximal assist (Pt 25 - 49%/lift and lower)    Chair/bed transfer   Chair/bed transfer method: Stand pivot Chair/bed transfer assist level: 2 helpers         Mudlogger          Walk 10 feet activity      Walk 50 feet with 2 turns activity      Walk 150 feet activity      Walk 10 feet on uneven surfaces activity      Stairs          Walk up/down 1 step activity  Walk up/down 4 steps activity      Walk up/down 12 steps activity      Pick up small objects from floor      Wheelchair          Wheel 50 feet with 2 turns activity      Wheel 150 feet activity        Therapy/Group: Individual Therapy  [name] 11/27/2014, 8:34 AM

## 2014-11-27 NOTE — Significant Event (Signed)
Hypoglycemic Event  CBG: 61  Treatment: 15 GM carbohydrate snack  Symptoms: None  Follow-up CBG: Time:0500 CBG Result:69  Possible Reasons for Event: Other: tube feed stopped for HD  Comments/MD notified:Pam Love, PA. Nor orders received. Will monitor    [name]  Remember to initiate Hypoglycemia Order Set & complete

## 2014-11-27 NOTE — Progress Notes (Signed)
Occupational Therapy Session Note  Patient Details  Name: [name] MRN: [id number] Date of Birth: 14-Mar-1956  Today's Date: 11/27/2014 OT Individual Time:  - 1030-1130    (60 min) 1st session                                         1530-1630  (60 min)  2nd session      Short Term Goals: Week 1:  OT Short Term Goal 1 (Week 1): Pt will maintain static sittting balance for 10 mins with no more than mod assist in preparation for selfcare tasks.   OT Short Term Goal 2 (Week 1): Pt will complete 2 grooming tasks with no more than mod demonstrational cueing for initiation and completion. OT Short Term Goal 3 (Week 1): Pt will perform UB bathing with mod assist sitting supported. OT Short Term Goal 4 (Week 1): Pt will use the RUE for washing face or UB with no more than mod assist. OT Short Term Goal 5 (Week 1): Pt will perform toilet transfer stand pivot with no more than mod assist.   Skilled Therapeutic Interventions/Progress Updates:   1st session:   Pt. Lying in bed. OT performed PROM to LUE with resistance in elbow flexion.  Max hand over hand assistance needed for pt to perform washing her face or washing any of her UB.  Educated pt on rolling to right and left.  Provided stretching through pelvic region.  Pt intitiated rolling to left x1 during treatment.  OT provided hand over hand for bathing and total assist with peri care.  OT applied clean brief.  Left pt in bed with all needs in reach.   Therapy Documentation Precautions:  Precautions Precautions: Fall Precaution Comments: fall, NPO, pusher to the right at times, non-verbal,  Restrictions Weight Bearing Restrictions: No     Pain:  1st and 2nd session:  No pain indicated Pain Assessment Pain Assessment: Faces Faces Pain Scale: No hurt PAINAD (Pain Assessment in Advanced Dementia) Breathing: occasional labored breathing, short period of hyperventilation Negative Vocalization: none Facial Expression: smiling or  inexpressive Body Language: relaxed Consolability: no need to console PAINAD Score: 1    :   Other Treatments:    2nd session:  Pt. Lying in bed.  OT addressed bed mobility, sit to stand, standing tolerance, sitting balance. RUE NMRE.  Lateral reaching, and weight shifting to left.  Pt. Was total assist with supine to sit.  Sat EOB for 30 minutes with activities focused on weight shifting, reaching to left, lateral leans.  Pt needed max assist for reaching.  Did sit to stand x2 with max assist and stood for 1-2 minutes with max assist for balance. Pt tends to grip rail tightly with LUE while sitting EOB or any object in her hand.  She had drooling on right side of mouth and was unaware.  OT instructed pt to wipe mouth with hand over hand assist.  Pt returned to lying and left with all needs in place.    Function:   Eating Eating               Grooming Oral Care,Brush Teeth, Clean Dentures Activity:             Wash, Rinse, Dry Face Activity   Assist Level: Helper performed activity      Wash, Rinse, Dry Hands Activity   Assist  Level: Helper performed activity      Brush, Comb Hair Activity        Shave Activity          Apply Makeup Activity                                                             Bathing Bathing position   Position: Bed  Bathing parts   Body parts bathed by helper: Chest;Abdomen;Front perineal area;Buttocks;Right upper leg;Right arm;Left arm;Left upper leg;Right lower leg;Left lower leg;Back  Bathing assist Assist Level: 2 helpers       Upper Body Dressing/Undressing Upper body dressing   What is the patient wearing?: Hospital gown                Upper body assist         Lower Body Dressing/Undressing Lower body dressing   What is the patient wearing?: Non-skid slipper socks                              Lower body assist         Toileting Toileting          Toileting assist      Bed Mobility Roll  left and right activity   Assist level: Total assist (Pt < 25%)  Sit to lying activity   Assist level: 2 helpers  Lying to sitting activity   Assist level: Maximal assist (Pt 25 - 49%)  Mobility details Bed mobility details: Tactile cues for initiation;Tactile cues for weight shifting   Transfers Sit to stand transfer   Sit to stand assist level: Maximal assist (Pt 25 - 49%/lift and lower)    Chair/bed transfer   Chair/bed transfer method: Stand pivot Chair/bed transfer assist level: 2 helpers        Probation officer Comprehension Comprehension assist level: Understands basic less than 25% of the time/ requires cueing >75% of the time  Expression Expression assist level: Expresses basis less than 25% of the time/requires cueing >75% of the time.  Social Interaction Social Interaction assist level: Interacts appropriately less than 25% of the time. May be withdrawn or combative.  Problem Solving Problem solving assist level: Solves basic less than 25% of the time - needs direction nearly all the time or does not effectively solve problems and may need a restraint for safety  Memory Memory assist level: Recognizes or recalls less than 25% of the time/requires cueing greater than 75% of the time    Therapy/Group: Individual Therapy  Humberto Seals 11/27/2014, 11:34 AM

## 2014-11-27 NOTE — Progress Notes (Signed)
59 y.o. 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 Interventional radiology, received inpatient rehabilitation services maintain on aspirin 81 mg for CVA prophylaxis. She was discharged to home with family ambulating short household distances using a rolling walker and minimal assist to contact-guard. Presented 11/10/2014 with right-sided weakness and left gaze deviation. Patient did require intubation and mechanical ventilation for acute respiratory failure and extubated 11/20/2014. MRI of the brain showed moderate size left MCA territory infarct affecting the left insula and operculum. Expected evolution of posterior right ACA and posterior left MCA infarcts seen in March. Patient did receive TPA. Echocardiogram with ejection fraction of 65% no wall motion abnormalities.   Subjective/Complaints: Pt with severe aphasia, cannot state name, unable to follow commands but is awake and alert Discussed pt with RN this am, no issues overnite ROS cannot obtain due to aphasia  Objective: Vital Signs: Blood pressure 132/84, pulse 105, temperature 98.6 F (37 C), temperature source Oral, resp. rate 28, weight 83.2 kg (183 lb 6.8 oz), SpO2 100 %. No results found. Results for orders placed or performed during the hospital encounter of 11/25/14 (from the past 72 hour(s))  Glucose, capillary     Status: Abnormal   Collection Time: 11/26/14  1:22 AM  Result Value Ref Range   Glucose-Capillary 102 (H) 65 - 99 mg/dL  Glucose, capillary     Status: Abnormal   Collection Time: 11/26/14  6:42 AM  Result Value Ref Range   Glucose-Capillary 118 (H) 65 - 99 mg/dL  Glucose, capillary     Status: None   Collection Time: 11/26/14 11:32 AM  Result Value Ref Range   Glucose-Capillary 93 65 - 99  mg/dL  Glucose, capillary     Status: Abnormal   Collection Time: 11/26/14  6:32 PM  Result Value Ref Range   Glucose-Capillary 123 (H) 65 - 99 mg/dL  Glucose, capillary     Status: None   Collection Time: 11/26/14  9:18 PM  Result Value Ref Range   Glucose-Capillary 88 65 - 99 mg/dL  CBC     Status: Abnormal   Collection Time: 11/27/14 12:24 AM  Result Value Ref Range   WBC 7.6 4.0 - 10.5 K/uL   RBC 3.31 (L) 3.87 - 5.11 MIL/uL   Hemoglobin 9.1 (L) 12.0 - 15.0 g/dL   HCT 28.5 (L) 36.0 - 46.0 %   MCV 86.1 78.0 - 100.0 fL   MCH 27.5 26.0 - 34.0 pg   MCHC 31.9 30.0 - 36.0 g/dL   RDW 18.4 (H) 11.5 - 15.5 %   Platelets 326 150 - 400 K/uL  Renal function panel     Status: Abnormal   Collection Time: 11/27/14 12:25 AM  Result Value Ref Range   Sodium 141 135 - 145 mmol/L   Potassium 3.8 3.5 - 5.1 mmol/L   Chloride 99 (L) 101 - 111 mmol/L   CO2 27 22 - 32 mmol/L   Glucose, Bld 75 65 - 99 mg/dL   BUN 79 (H) 6 - 20 mg/dL   Creatinine, Ser 7.47 (H) 0.44 - 1.00 mg/dL   Calcium 8.8 (L) 8.9 - 10.3 mg/dL   Phosphorus 4.0 2.5 - 4.6 mg/dL   Albumin 2.4 (L) 3.5 - 5.0 g/dL   GFR calc non Af Amer 5 (L) >60 mL/min   GFR calc Af Amer 6 (  L) >60 mL/min    Comment: (NOTE) The eGFR has been calculated using the CKD EPI equation. This calculation has not been validated in all clinical situations. eGFR's persistently <60 mL/min signify possible Chronic Kidney Disease.    Anion gap 15 5 - 15  Glucose, capillary     Status: Abnormal   Collection Time: 11/27/14  4:43 AM  Result Value Ref Range   Glucose-Capillary 61 (L) 65 - 99 mg/dL  Glucose, capillary     Status: None   Collection Time: 11/27/14  5:02 AM  Result Value Ref Range   Glucose-Capillary 69 65 - 99 mg/dL  Glucose, capillary     Status: Abnormal   Collection Time: 11/27/14  5:39 AM  Result Value Ref Range   Glucose-Capillary 105 (H) 65 - 99 mg/dL     HEENT: poor oral movements  facial diplegia Cardio: RRR and no murmur Resp:  CTA B/L and increased RR with mild activation of accessory muscles GI: BS positive and NT, ND, pior miline abd incision with extensive scarring Extremity:  Pulses positive and No Edema Skin:   Other G tube site clean Neuro: Flat, Cranial Nerve Abnormalities bilateral central 7, Abnormal Sensory reduced withdrawl to pinch in all 4 ext, Abnormal Motor 0/5 in RUE, 2- LUE, trace R and left knee ext, Abnormal FMC Tone  increased ext tone in BLE, increased flexor tone in RUE, Aphasic and Apraxic Musc/Skel:  Other decrease ROM in BIlateral knees and Right shoulder and elbow due to tone Gen NAD   Assessment/Plan: 1. 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 Physiatrist is providing close team supervision and 24 hour management of active medical problems listed below. Physiatrist and rehab team continue to assess barriers to discharge/monitor patient progress toward functional and medical goals. FIM: Function - Bathing Body parts bathed by helper: Right arm, Left arm, Chest, Abdomen, Front perineal area, Buttocks, Right upper leg, Left upper leg, Right lower leg, Left lower leg, Back  Function- Upper Body Dressing/Undressing What is the patient wearing?: Hospital gown Function - Lower Body Dressing/Undressing What is the patient wearing?: Non-skid slipper socks Position: Standing at sink Non-skid slipper socks- Performed by helper: Don/doff right sock, Don/doff left sock  Function - Toileting Toileting activity did not occur: No continent bowel/bladder event  Function - Air cabin crew transfer activity did not occur: Safety/medical concerns  Function - Chair/bed transfer Chair/bed transfer method: Stand pivot Chair/bed transfer assist level: Maximal assist (Pt 25 -  49%/lift and lower) Chair/bed transfer details: Visual cues/gestures for sequencing, Tactile cues for posture, Tactile cues for initiation, Tactile cues for weight shifting, Tactile cues for weight bearing  Function - Locomotion: Wheelchair Will patient use wheelchair at discharge?: Yes Wheelchair activity did not occur:  (Attempted hand-over-hand facilitation for w/c propulsion, visual demonstration, however no pt initiation evident) Assist Level: Dependent (Pt equals 0%) Function - Locomotion: Ambulation Ambulation activity did not occur: Safety/medical concerns (Attempted ambulation, however upon standing x3 reps pt unable to step (unable to determine if due to$Remove' \\apraxia'[id number]$  or pt fearful)) Walk 10 feet activity did not occur: Safety/medical concerns Walk 50 feet with 2 turns activity did not occur: Safety/medical concerns Walk 150 feet activity did not occur: Safety/medical concerns Walk 10 feet on uneven surfaces activity did not occur: Safety/medical concerns  Function - Comprehension Comprehension: Auditory Comprehension assist  level: Understands basic less than 25% of the time/ requires cueing >75% of the time  Function - Expression Expression: Nonverbal Expression assist level: Expresses basis less than 25% of the time/requires cueing >75% of the time.  Function - Social Interaction Social Interaction assist level: Interacts appropriately less than 25% of the time. May be withdrawn or combative.  Function - Problem Solving Problem solving assist level: Solves basic less than 25% of the time - needs direction nearly all the time or does not effectively solve problems and may need a restraint for safety  Function - Memory Memory assist level: Recognizes or recalls less than 25% of the time/requires cueing greater than 75% of the time Patient normally able to recall (first 3 days only): None of the above  Medical Problem List and Plan: 1. Functional deficits secondary to left MCA  infarct/respiratory failure. Extubated 11/20/2014. Continue nebulizer treatments as advised 2. DVT Prophylaxis/Anticoagulation: SCDs. Monitor for any signs of DVT. Check venous dopplers 3. Pain Management: Tylenol as needed 4. Dysphagia. Patient is nothing by mouth. Status post gastrostomy tube 08/12/2014 per interventional radiology for nutritional support. poor prognosis for improvement given severe bilateral deficits 5. Neuropsych: This patient is not capable of making decisions on her own behalf. 6. Skin/Wound Care: Routine skin checks. PEG site maintenance. Proper turning and nutrition 7. Fluids/Electrolytes/Nutrition: Routine I&O. Adjust TF based on nutritional needs. Renal adjusting dialysis, complicated situation 8. End-stage renal disease with hemodialysis. Follow-up per renal services. Hemodialysis in PM after therapies to avoid conflict 9. Seizure prophylaxis. Keppra 500 mg twice a day, monitor drug toxicity as well as for seizure 10. Hypothyroidism. Synthroid 11. Chronic anemia. Aranesp weekly with dialysis, hgb 9.1 on 8/26 12. History of asthma. Continue nebulizers as needed. Anxiety component also.  13. Mood/anxiety. Continue Xanax on dialysis days 14.  Hx of recurrent resp failure, high risk for PNA given comorbid conditions, increased RR, congested but unable to cough, check CXR, ?overload vs recurrent PNA, recheck CBC, is afeb 15.  Bowel incont- monitor skin LOS (Days) 2 A FACE TO FACE EVALUATION WAS PERFORMED  KIRSTEINS,ANDREW E 11/27/2014, 7:46 AM

## 2014-11-27 NOTE — Progress Notes (Signed)
Hemodialysis=Tolerated well. Total UF 1.7L. Would have achieved 2L goal however system clotted d/t no heparin tx despite flushes. All blood able to be given back. Report called to RN. Post BP 125/99 HR 102

## 2014-11-27 NOTE — IPOC Note (Signed)
Overall Plan of Care [facility]) Patient Details Name: [name] MRN: [id number] DOB: May 15, 1955  Admitting Diagnosis: L MCA infarct  old cva  Hospital Problems: Principal Problem:   Left middle cerebral artery stroke Active Problems:   Acute respiratory failure with hypoxia   ESRD needing dialysis   Right hemiparesis   Aphasia due to stroke     Functional Problem List: Nursing Bladder, Bowel, Endurance, Nutrition, Pain, Safety  PT Balance, Perception, Safety, Endurance, Sensory, Motor  OT Balance, Cognition, Endurance, Motor, Safety, Perception  SLP Cognition, Linguistic, Nutrition  TR         Basic ADL's: OT Eating, Grooming, Bathing, Dressing, Toileting     Advanced  ADL's: OT       Transfers: PT Bed Mobility, Bed to Chair, Car, Occupational psychologist, Research scientist (life sciences): PT Ambulation, Psychologist, prison and probation services, Stairs     Additional Impairments: OT Fuctional Use of Upper Extremity  SLP Swallowing, Communication, Social Cognition comprehension, expression Problem Solving, Memory, Attention, Awareness  TR      Anticipated Outcomes Item Anticipated Outcome  Self Feeding supervision  Swallowing  Min assist    Basic self-care  min to mod assist  Toileting  min assist   Bathroom Transfers min assist  Bowel/Bladder  Continent to bowel and bladder with Max. assisst.  Transfers  minA  Locomotion  minA  Communication  Mod assist   Cognition  Mod assist   Pain  Less than 3,on scale 1 to 10.  Safety/Judgment  Free from falls during her stay in rehab.   Therapy Plan: PT Intensity: Minimum of 1-2 x/day ,45 to 90 minutes PT Frequency: 5 out of 7 days PT Duration Estimated Length of Stay: 25-28 days OT Intensity: Minimum of 1-2 x/day, 45 to 90 minutes OT Frequency: 5 out of 7 days OT Duration/Estimated Length of Stay: 23-25 days SLP Intensity: Minumum of 1-2 x/day, 30 to 90 minutes SLP Frequency: 3 to 5 out of 7 days SLP Duration/Estimated Length of  Stay: 21-28 days        Team Interventions: Nursing Interventions Bladder Management, Bowel Management, Disease Management/Prevention, Pain Management, Psychosocial Support, Discharge Planning  PT interventions Ambulation/gait training, Balance/vestibular training, Cognitive remediation/compensation, Community reintegration, Disease management/prevention, Discharge planning, DME/adaptive equipment instruction, Functional mobility training, Neuromuscular re-education, Patient/family education, Psychosocial support, Stair training, Therapeutic Exercise, Therapeutic Activities, UE/LE Strength taining/ROM, UE/LE Coordination activities, Wheelchair propulsion/positioning, Visual/perceptual remediation/compensation  OT Interventions Warden/ranger, Cognitive remediation/compensation, DME/adaptive equipment instruction, Neuromuscular re-education, Self Care/advanced ADL retraining, Therapeutic Exercise, UE/LE Strength taining/ROM, UE/LE Coordination activities, Splinting/orthotics, Patient/family education, Functional electrical stimulation, Discharge planning, Functional mobility training, Therapeutic Activities, Visual/perceptual remediation/compensation  SLP Interventions Cognitive remediation/compensation, Cueing hierarchy, Functional tasks, Multimodal communication approach, Patient/family education, Internal/external aids, Speech/Language facilitation, Dysphagia/aspiration precaution training, Environmental controls  TR Interventions    SW/CM Interventions Discharge Planning, Psychosocial Support, Patient/Family Education    Team Discharge Planning: Destination: PT-Home ,OT- Home , SLP-Home (versus SNF ) Projected Follow-up: PT-Home health PT, OT-  Home health OT, 24 hour supervision/assistance, SLP-Home Health SLP, Outpatient SLP, Skilled Nursing facility, 24 hour supervision/assistance Projected Equipment Needs: PT-To be determined, OT- 3 in 1 bedside comode, Tub/shower bench, SLP-   Equipment Details: PT-Pt has rollator , OT-  Patient/family involved in discharge planning: PT- Family member/caregiver,  OT-Patient unable/family or caregiver not available, SLP-Patient, Family member/caregiver  MD ELOS: 20-25d Medical Rehab Prognosis:  Guarded Assessment: 59 yo female with R ACA infarct in May 2016, now with L MCA infarct with R  HP and Aphasia.  Has G tube from prior infarct  Now requiring 24/7 Rehab RN,MD, as well as CIR level PT, OT and SLP.  Treatment team will focus on ADLs and mobility with goals set at [facility]   See Team Conference Notes for weekly updates to the plan of care

## 2014-11-27 NOTE — Progress Notes (Signed)
Speech Language Pathology Daily Session Note  Patient Details  Name: [name] MRN: [id number] Date of Birth: [birth year]  Today's Date: 11/27/2014 SLP Individual Time: 1400-1445 SLP Individual Time Calculation (min): 45 min  Short Term Goals: Week 1: SLP Short Term Goal 1 (Week 1): Pt will improve management of her secretions as evidenced by ability to orally expectorate secretions and anteriorly contain her saliva with max assist multimodal cues.   SLP Short Term Goal 2 (Week 1): Pt will consume PO trials with max assist multimodal cues to clear boluses from the oral cavity and to initiate swallow in a timely manner over 3 consecutive sessions prior to completion of objective swallow study.  SLP Short Term Goal 3 (Week 1): Pt will initiate a basic,familiar task with max assist multimodal cues over 50% of observable opportunities.  SLP Short Term Goal 4 (Week 1): Pt will visually scan to midline in >25% of observable opportunities during basic, functional tasks with max assist multimodal cues.  SLP Short Term Goal 5 (Week 1): Pt will vocalize to produce vowels in isolation with max assist multimodal cues.    Skilled Therapeutic Interventions: Skilled treatment session focused on addressing cognitive-linguistic goals. SLP facilitated session by providing Total hand-over-hand assist for completion of oral care via suctioning; no patient initiation was observed during task.  Task as also limited by apraxic jaw clenching and no anterior loss of saliva was observed; however, audible wheezes were present and cues to cough or clear were not effective.  As a result; PO trials were not attempted to today.  Other self-care tasks such as washing face, brushing hair and putting on lotion elicited fleeting initiation; however, patient initiated washing and drying table with Max multimodal cues.  Patient initiated a basic sorting (field of two) task in 25% of opportunities with Max assist multimodal cues.   Sorting appeared to be most effective task for targeting initiation at this time.  Continue with current plan of care.    Function:  Eating Eating    not observed             Cognition Comprehension Comprehension assist level: Understands basic less than 25% of the time/ requires cueing >75% of the time  Expression   Expression assist level: Expresses basis less than 25% of the time/requires cueing >75% of the time.  Social Interaction Social Interaction assist level: Interacts appropriately less than 25% of the time. May be withdrawn or combative.  Problem Solving Problem solving assist level: Solves basic less than 25% of the time - needs direction nearly all the time or does not effectively solve problems and may need a restraint for safety  Memory Memory assist level: Recognizes or recalls less than 25% of the time/requires cueing greater than 75% of the time    Pain Pain Assessment Pain Assessment: No/denies pain  Therapy/Group: Individual Therapy  [name]., CCC-SLP [phone]  [name] 11/27/2014, 4:05 PM

## 2014-11-27 NOTE — Progress Notes (Signed)
Hypoglycemic Event  CBG: 69  Treatment: 15 GM carbohydrate snack  Symptoms: None  Follow-up CBG: Time:0535 CBG Result:105  Possible Reasons for Event: Unknown  Comments/MD notified:Pam Love, PA. No new orders    [name] H  Remember to initiate Hypoglycemia Order Set & complete

## 2014-11-28 ENCOUNTER — Inpatient Hospital Stay (HOSPITAL_COMMUNITY): Payer: Medicaid Other | Admitting: Speech Pathology

## 2014-11-28 ENCOUNTER — Inpatient Hospital Stay (HOSPITAL_COMMUNITY): Payer: Medicaid Other | Admitting: Occupational Therapy

## 2014-11-28 ENCOUNTER — Inpatient Hospital Stay (HOSPITAL_COMMUNITY): Payer: Medicaid Other

## 2014-11-28 LAB — GLUCOSE, CAPILLARY
GLUCOSE-CAPILLARY: 96 mg/dL (ref 65–99)
GLUCOSE-CAPILLARY: 77 mg/dL (ref 65–99)
Glucose-Capillary: 99 mg/dL (ref 65–99)

## 2014-11-28 LAB — RENAL FUNCTION PANEL
Albumin: 2.4 g/dL — ABNORMAL LOW (ref 3.5–5.0)
Anion gap: 11 (ref 5–15)
BUN: 51 mg/dL — ABNORMAL HIGH (ref 6–20)
CO2: 28 mmol/L (ref 22–32)
Calcium: 8.9 mg/dL (ref 8.9–10.3)
Chloride: 97 mmol/L — ABNORMAL LOW (ref 101–111)
Creatinine, Ser: 6.29 mg/dL — ABNORMAL HIGH (ref 0.44–1.00)
GFR calc Af Amer: 8 mL/min — ABNORMAL LOW (ref >60)
GFR calc non Af Amer: 7 mL/min — ABNORMAL LOW (ref >60)
Glucose, Bld: 115 mg/dL — ABNORMAL HIGH (ref 65–99)
Phosphorus: 3.7 mg/dL (ref 2.5–4.6)
Potassium: 4 mmol/L (ref 3.5–5.1)
Sodium: 136 mmol/L (ref 135–145)

## 2014-11-28 LAB — CBC
HCT: 32.9 % — ABNORMAL LOW (ref 36.0–46.0)
Hemoglobin: 10.2 g/dL — ABNORMAL LOW (ref 12.0–15.0)
MCH: 27.5 pg (ref 26.0–34.0)
MCHC: 31 g/dL (ref 30.0–36.0)
MCV: 88.7 fL (ref 78.0–100.0)
Platelets: 341 10*3/uL (ref 150–400)
RBC: 3.71 MIL/uL — ABNORMAL LOW (ref 3.87–5.11)
RDW: 18.6 % — ABNORMAL HIGH (ref 11.5–15.5)
WBC: 8.2 10*3/uL (ref 4.0–10.5)

## 2014-11-28 MED ORDER — LIDOCAINE-PRILOCAINE 2.5-2.5 % EX CREA
1.0000 "application " | TOPICAL_CREAM | CUTANEOUS | Status: DC | PRN
  Filled 2014-11-28: qty 5

## 2014-11-28 MED ORDER — SODIUM CHLORIDE 0.9 % IV SOLN: 100.0000 mL | INTRAVENOUS | Status: DC | PRN

## 2014-11-28 MED ORDER — LIDOCAINE HCL (PF) 1 % IJ SOLN: 5.0000 mL | INTRAMUSCULAR | Status: DC | PRN

## 2014-11-28 MED ORDER — NEPRO/CARBSTEADY PO LIQD: 237.0000 mL | ORAL | Status: DC | PRN

## 2014-11-28 MED ORDER — HEPARIN SODIUM (PORCINE) 1000 UNIT/ML DIALYSIS
1000.0000 [IU] | INTRAMUSCULAR | Status: DC | PRN
Start: 2014-11-28 — End: 2014-11-28
  Filled 2014-11-28: qty 1

## 2014-11-28 MED ORDER — PENTAFLUOROPROP-TETRAFLUOROETH EX AERO: 1.0000 "application " | INHALATION_SPRAY | CUTANEOUS | Status: DC | PRN

## 2014-11-28 MED ORDER — ALTEPLASE 2 MG IJ SOLR
2.0000 mg | Freq: Once | INTRAMUSCULAR | Status: DC | PRN
  Filled 2014-11-28: qty 2

## 2014-11-28 NOTE — Progress Notes (Signed)
Pharmacy note:  Patient was noted with a heparin allergy. 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 On 07/03/14 it was noted that a heparin antibody was low positive (0.533) and SRA negative.  With the low SRA this rules out HIT  -Discussed with Dr. Arlean Hopping: will remove heparin allergy  Harland German, Pharm D 11/28/2014 7:00 PM

## 2014-11-28 NOTE — Progress Notes (Signed)
Speech Language Pathology Daily Session Note  Patient Details  Name: [name] MRN: [id number] Date of Birth: [birth year]  Today's Date: 11/28/2014 SLP Individual Time: 1351-1451 SLP Individual Time Calculation (min): 60 min  Short Term Goals: Week 1: SLP Short Term Goal 1 (Week 1): Pt will improve management of her secretions as evidenced by ability to orally expectorate secretions and anteriorly contain her saliva with max assist multimodal cues.   SLP Short Term Goal 2 (Week 1): Pt will consume PO trials with max assist multimodal cues to clear boluses from the oral cavity and to initiate swallow in a timely manner over 3 consecutive sessions prior to completion of objective swallow study.  SLP Short Term Goal 3 (Week 1): Pt will initiate a basic,familiar task with max assist multimodal cues over 50% of observable opportunities.  SLP Short Term Goal 4 (Week 1): Pt will visually scan to midline in >25% of observable opportunities during basic, functional tasks with max assist multimodal cues.  SLP Short Term Goal 5 (Week 1): Pt will vocalize to produce vowels in isolation with max assist multimodal cues.    Skilled Therapeutic Interventions: Skilled treatment session focused on cognitive goals.  SLP facilitated session by providing max A receptively identify a targeted pictured object from a field of 2 pictures and identifying objects in patient's room.  She required mod-max A responding to biographical information and environmental yes/no questions; patient indicated yes nonverbally through head nod, but did not nonverbally communicate no.  Patient required max A sustaining attention by frequently providing redirection to present task .  Pt required max-total A sorting coins by coin value; patients physical deficits may have interferred with the task.  She required mod A sorting colors when provided visual cues.  Patient required max A with basic problem solving task of identifying how to call  RN with call bell button.  Continue with current plan of care.   Function:  Eating Eating                 Cognition Comprehension Comprehension assist level: Understands basic less than 25% of the time/ requires cueing >75% of the time  Expression   Expression assist level: Expresses basis less than 25% of the time/requires cueing >75% of the time.  Social Interaction Social Interaction assist level: Interacts appropriately less than 25% of the time. May be withdrawn or combative.  Problem Solving Problem solving assist level: Solves basic less than 25% of the time - needs direction nearly all the time or does not effectively solve problems and may need a restraint for safety  Memory Memory assist level: Recognizes or recalls less than 25% of the time/requires cueing greater than 75% of the time    Pain Pain Assessment Pain Assessment: No/denies pain Pain Score: 0-No pain  Therapy/Group: Individual Therapy   Cranford Mon, M.A. CCC-SLP Cranford Mon A 11/28/2014, 3:36 PM

## 2014-11-28 NOTE — Progress Notes (Signed)
Occupational Therapy Session Note  Patient Details  Name: [name] MRN: [id number] Date of Birth: 1955/05/03  Today's Date: 11/28/2014 OT Individual Time:  -   1030-1200  ( ) )      Short Term Goals: Week 1:  OT Short Term Goal 1 (Week 1): Pt will maintain static sittting balance for 10 mins with no more than mod assist in preparation for selfcare tasks.   OT Short Term Goal 2 (Week 1): Pt will complete 2 grooming tasks with no more than mod demonstrational cueing for initiation and completion. OT Short Term Goal 3 (Week 1): Pt will perform UB bathing with mod assist sitting supported. OT Short Term Goal 4 (Week 1): Pt will use the RUE for washing face or UB with no more than mod assist. OT Short Term Goal 5 (Week 1): Pt will perform toilet transfer stand pivot with no more than mod assist.   Skilled Therapeutic Interventions/Progress Updates:    Pt sitting in wc upon OT arrival.  Seat wc at sink and educated/demonstrated bathing and dressing.  Pt using LUE and RUE with Hand over hand assistance with more functional use in LUE. Pt flexed right shoulder 80 degrees and wiped out sink with LUE.  Attempted to comb hair with LUE but needed assistance with left shoulder external rotation and abduction which is tight in both planes.  Assisted pt with sit to stand at sink for peri care with total assist +2.with pt leaning more and more to right after 40 seconds.  OT provided  PROM/AAROM to BUE. Pt. Showed increased occular movements to right side during treatment.   Left pt in wc with safety belt on and all needs in reach.    Therapy Documentation Precautions:  Precautions Precautions: Fall Precaution Comments: fall, NPO, pusher to the right at times, non-verbal,  Restrictions Weight Bearing Restrictions: No   Pain:  none  Other Treatments:    Function:   Eating Eating               Grooming Oral Care,Brush Teeth, Clean Dentures Activity:             Wash, Rinse, Dry  Face Activity          Wash, Rinse, Dry Hands Activity          Brush, Comb Hair Activity        Shave Activity          Apply Makeup Activity                                                             Bathing Bathing position      Bathing parts      Bathing assist         Upper Body Dressing/Undressing Upper body dressing                    Upper body assist         Lower Body Dressing/Undressing Lower body dressing                                  Lower body assist         Toileting Toileting  Toileting assist      Bed Mobility Roll left and right activity      Sit to lying activity      Lying to sitting activity      Mobility details     Transfers Sit to stand transfer        Chair/bed transfer              Toilet transfer                Tub/shower transfer             Cognition Comprehension    Expression    Social Interaction    Problem Solving    Memory      Therapy/Group: Individual Therapy  Humberto Seals 11/28/2014, 8:18 AM

## 2014-11-28 NOTE — Progress Notes (Addendum)
59 y.o. 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 Interventional radiology, received inpatient rehabilitation services maintain on aspirin 81 mg for CVA prophylaxis. She was discharged to home with family ambulating short household distances using a rolling walker and minimal assist to contact-guard. Presented 11/10/2014 with right-sided weakness and left gaze deviation. Patient did require intubation and mechanical ventilation for acute respiratory failure and extubated 11/20/2014. MRI of the brain showed moderate size left MCA territory infarct affecting the left insula and operculum. Expected evolution of posterior right ACA and posterior left MCA infarcts seen in March. Patient did receive TPA. Echocardiogram with ejection fraction of 65% no wall motion abnormalities.   Subjective/Complaints: Pt without issues over night per nurse. Appears comfortable in bed this am ROS cannot obtain due to global aphasia  Objective: Vital Signs: Blood pressure 143/84, pulse 104, temperature 98.3 F (36.8 C), temperature source Oral, resp. rate 28, weight 83.2 kg (183 lb 6.8 oz), SpO2 100 %. Dg Chest 2 View  11/27/2014   CLINICAL DATA:  Stroke.  Abnormal respiratory rate.  EXAM: CHEST  2 VIEW  COMPARISON:  11/24/2014.  FINDINGS: Dual-lumen catheter noted in stable position. Mediastinum hilar structures are normal. Cardiomegaly with normal pulmonary vascularity. Low lung volumes. No pleural effusion or pneumothorax. No acute bony abnormality.  IMPRESSION: 1. Dual lumen catheter noted in stable position. Stable cardiomegaly. No pulmonary venous congestion. 2. Low lung volumes.  No acute infiltrate.   Electronically Signed   By: Marcello Moores  Register   On: 11/27/2014 08:55   Results for orders  placed or performed during the hospital encounter of 11/25/14 (from the past 72 hour(s))  Glucose, capillary     Status: Abnormal   Collection Time: 11/26/14  1:22 AM  Result Value Ref Range   Glucose-Capillary 102 (H) 65 - 99 mg/dL  Glucose, capillary     Status: Abnormal   Collection Time: 11/26/14  6:42 AM  Result Value Ref Range   Glucose-Capillary 118 (H) 65 - 99 mg/dL  Glucose, capillary     Status: None   Collection Time: 11/26/14 11:32 AM  Result Value Ref Range   Glucose-Capillary 93 65 - 99 mg/dL  Glucose, capillary     Status: Abnormal   Collection Time: 11/26/14  6:32 PM  Result Value Ref Range   Glucose-Capillary 123 (H) 65 - 99 mg/dL  Glucose, capillary     Status: None   Collection Time: 11/26/14  9:18 PM  Result Value Ref Range   Glucose-Capillary 88 65 - 99 mg/dL  CBC     Status: Abnormal   Collection Time: 11/27/14 12:24 AM  Result Value Ref Range   WBC 7.6 4.0 - 10.5 K/uL   RBC 3.31 (L) 3.87 - 5.11 MIL/uL   Hemoglobin 9.1 (L) 12.0 - 15.0 g/dL   HCT 28.5 (L) 36.0 - 46.0 %   MCV 86.1 78.0 - 100.0 fL   MCH 27.5 26.0 - 34.0 pg   MCHC 31.9 30.0 - 36.0 g/dL   RDW 18.4 (H) 11.5 - 15.5 %   Platelets 326 150 - 400 K/uL  Renal function panel     Status: Abnormal   Collection Time: 11/27/14 12:25 AM  Result Value Ref Range   Sodium 141 135 - 145 mmol/L   Potassium 3.8 3.5 - 5.1 mmol/L   Chloride 99 (L) 101 -  111 mmol/L   CO2 27 22 - 32 mmol/L   Glucose, Bld 75 65 - 99 mg/dL   BUN 79 (H) 6 - 20 mg/dL   Creatinine, Ser 7.47 (H) 0.44 - 1.00 mg/dL   Calcium 8.8 (L) 8.9 - 10.3 mg/dL   Phosphorus 4.0 2.5 - 4.6 mg/dL   Albumin 2.4 (L) 3.5 - 5.0 g/dL   GFR calc non Af Amer 5 (L) >60 mL/min   GFR calc Af Amer 6 (L) >60 mL/min    Comment: (NOTE) The eGFR has been calculated using the CKD EPI equation. This calculation has not been validated in all clinical situations. eGFR's persistently <60 mL/min signify possible Chronic Kidney Disease.    Anion gap 15 5 - 15   Glucose, capillary     Status: Abnormal   Collection Time: 11/27/14  4:43 AM  Result Value Ref Range   Glucose-Capillary 61 (L) 65 - 99 mg/dL  Glucose, capillary     Status: None   Collection Time: 11/27/14  5:02 AM  Result Value Ref Range   Glucose-Capillary 69 65 - 99 mg/dL  Glucose, capillary     Status: Abnormal   Collection Time: 11/27/14  5:39 AM  Result Value Ref Range   Glucose-Capillary 105 (H) 65 - 99 mg/dL  CBC with Differential/Platelet     Status: Abnormal   Collection Time: 11/27/14 10:08 AM  Result Value Ref Range   WBC 6.8 4.0 - 10.5 K/uL   RBC 4.03 3.87 - 5.11 MIL/uL   Hemoglobin 11.3 (L) 12.0 - 15.0 g/dL   HCT 35.7 (L) 36.0 - 46.0 %   MCV 88.6 78.0 - 100.0 fL   MCH 28.0 26.0 - 34.0 pg   MCHC 31.7 30.0 - 36.0 g/dL   RDW 18.4 (H) 11.5 - 15.5 %   Platelets 301 150 - 400 K/uL   Neutrophils Relative % 64 43 - 77 %   Neutro Abs 4.3 1.7 - 7.7 K/uL   Lymphocytes Relative 14 12 - 46 %   Lymphs Abs 1.0 0.7 - 4.0 K/uL   Monocytes Relative 18 (H) 3 - 12 %   Monocytes Absolute 1.3 (H) 0.1 - 1.0 K/uL   Eosinophils Relative 4 0 - 5 %   Eosinophils Absolute 0.3 0.0 - 0.7 K/uL   Basophils Relative 0 0 - 1 %   Basophils Absolute 0.0 0.0 - 0.1 K/uL  Glucose, capillary     Status: Abnormal   Collection Time: 11/27/14 12:00 PM  Result Value Ref Range   Glucose-Capillary 102 (H) 65 - 99 mg/dL   Comment 1 Notify RN   Glucose, capillary     Status: None   Collection Time: 11/27/14  6:22 PM  Result Value Ref Range   Glucose-Capillary 94 65 - 99 mg/dL   Comment 1 Notify RN   Glucose, capillary     Status: None   Collection Time: 11/27/14 11:50 PM  Result Value Ref Range   Glucose-Capillary 80 65 - 99 mg/dL   Comment 1 Notify RN   Glucose, capillary     Status: None   Collection Time: 11/28/14  6:34 AM  Result Value Ref Range   Glucose-Capillary 96 65 - 99 mg/dL   Comment 1 Notify RN      HEENT: poor oral movements  facial diplegia Cardio: RRR and no murmur Resp:  CTA B/L and increased RR with mild activation of accessory muscles GI: BS positive and NT, ND, pior miline abd incision with extensive scarring  Extremity:  Pulses positive and No Edema Skin:   Other G tube site clean Neuro: Flat, Cranial Nerve Abnormalities bilateral central 7, Abnormal Sensory some withdrawl to pinch in all 4 ext, Abnormal Motor 0/5 in RUE, 2- LUE, trace R and left knee ext, Abnormal FMC Tone  increased ext tone in BLE, increased flexor tone in RUE, Aphasic and Apraxic Musc/Skel:  Other decrease ROM in BIlateral knees and Right shoulder and elbow due to tone Gen NAD   Assessment/Plan: 1. 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 Physiatrist is providing close team supervision and 24 hour management of active medical problems listed below. Physiatrist and rehab team continue to assess barriers to discharge/monitor patient progress toward functional and medical goals. FIM: Function - Bathing Position: Bed Body parts bathed by helper: Chest, Abdomen, Front perineal area, Buttocks, Right upper leg, Right arm, Left arm, Left upper leg, Right lower leg, Left lower leg, Back Assist Level: 2 helpers  Function- Upper Body Dressing/Undressing What is the patient wearing?: Hospital gown Function - Lower Body Dressing/Undressing What is the patient wearing?: Non-skid slipper socks Position: Standing at sink Non-skid slipper socks- Performed by helper: Don/doff right sock, Don/doff left sock  Function - Toileting Toileting activity did not occur: No continent bowel/bladder event  Function - Air cabin crew transfer activity did not occur: Safety/medical concerns  Function - Chair/bed transfer Chair/bed transfer method: Stand pivot Chair/bed transfer  assist level: 2 helpers Chair/bed transfer details: Visual cues/gestures for sequencing, Manual facilitation for weight shifting, Manual facilitation for weight bearing, Manual facilitation for placement  Function - Locomotion: Wheelchair Will patient use wheelchair at discharge?: Yes Wheelchair activity did not occur:  (Attempted hand-over-hand facilitation for w/c propulsion, visual demonstration, however no pt initiation evident) Assist Level: Dependent (Pt equals 0%) Function - Locomotion: Ambulation Ambulation activity did not occur: Safety/medical concerns (Attempted ambulation, however upon standing x3 reps pt unable to step (unable to determine if due to$Remove' \\[id number]'[id number]$  or pt fearful)) Assistive device: Parallel bars Max distance: 2 Assist level: 2 helpers Walk 10 feet activity did not occur: Safety/medical concerns Walk 50 feet with 2 turns activity did not occur: Safety/medical concerns Walk 150 feet activity did not occur: Safety/medical concerns Walk 10 feet on uneven surfaces activity did not occur: Safety/medical concerns  Function - Comprehension Comprehension: Auditory Comprehension assist level: Understands basic less than 25% of the time/ requires cueing >75% of the time  Function - Expression Expression: Nonverbal Expression assist level: Expresses basis less than 25% of the time/requires cueing >75% of the time.  Function - Social Interaction Social Interaction assist level: Interacts appropriately less than 25% of the time. May be withdrawn or combative.  Function - Problem Solving Problem solving assist level: Solves basic less than 25% of the time - needs direction nearly all the time or does not effectively solve problems and may need a restraint for safety  Function - Memory Memory assist level: Recognizes or recalls less than 25% of the time/requires cueing greater than 75% of the time Patient normally able to recall (first 3 days only): None of the  above  Medical Problem List and Plan: 1. Functional deficits secondary to left MCA infarct/respiratory failure. Extubated 11/20/2014.  2. DVT Prophylaxis/Anticoagulation: SCDs. Monitor for any signs of DVT. 8/12 venous dopplers neg 3. Pain Management: Tylenol as needed 4. Dysphagia.  Patient is nothing by mouth. Status post gastrostomy tube 08/12/2014 per interventional radiology for nutritional support. poor prognosis for improvement given severe bilateral deficits 5. Neuropsych: This patient is not capable of making decisions on her own behalf. 6. Skin/Wound Care: Routine skin checks. PEG site maintenance. Proper turning and nutrition 7. Fluids/Electrolytes/Nutrition: Routine I&O. Adjust TF based on nutritional needs. Renal adjusting dialysis, complicated situation 8. End-stage renal disease with hemodialysis. Follow-up per renal services. Hemodialysis in PM after therapies to avoid conflict 9. Seizure prophylaxis. Keppra 500 mg twice a day, monitor drug toxicity as well as for seizure 10. Hypothyroidism. Synthroid 11. Chronic anemia. Aranesp weekly with dialysis, hgb 9.1 on 8/26 12. History of asthma. Continue nebulizers as needed. Anxiety component also.  13. Mood/anxiety. Continue Xanax on dialysis days 14.  Hx of recurrent resp failure, high risk for PNA given comorbid conditions,  RR increases at times. Sounds less congested today, fair air flow. cxr neg yesterday---I personally reviewed today. Afebrile. No leukocytosis---I personally reviewed labs  -continue to aggressively monitor, aspiration precautions, 15.  Bowel incont- monitor skin LOS (Days) 3 A FACE TO FACE EVALUATION WAS PERFORMED  [name] T 11/28/2014, 9:40 AM

## 2014-11-28 NOTE — Progress Notes (Signed)
Physical Therapy Session Note  Patient Details  Name: [name] MRN: [id number] Date of Birth: 01/12/1956  Today's Date: 11/28/2014 PT Individual Time: 0830-0915 PT Individual Time Calculation (min): 45 min   Short Term Goals: Week 1:  PT Short Term Goal 1 (Week 1): Will perform bed mobility with modA and use of bedrails PT Short Term Goal 2 (Week 1): Will perform supine <>sit maxA +1 and mod cues PT Short Term Goal 3 (Week 1): Will perform stand pivot transfer modA PT Short Term Goal 4 (Week 1): Will perform ambulation x25' with LRAD and maxA +1 PT Short Term Goal 5 (Week 1): Will perform w/c propulsion x50' with modA  Skilled Therapeutic Interventions/Progress Updates:   Pt apraxic with bed mobilty, transfers and gait.  Pushing R in standing, and poor initiation of RLE movement.  Gait in // with +2 for safety, pt performing <50% of effort.   Up/down 1 4" high step, 2 rails, leading with L foot, ascending forward, descending backward.  PT returned pt to room; quick release belt donned and all needs left within reach.    Therapy Documentation Precautions:  Precautions Precautions: Fall Precaution Comments: fall, NPO, pusher to the right at times, non-verbal,  Restrictions Weight Bearing Restrictions: No   Vital Signs: Therapy Vitals Patient Position (if appropriate): Sitting Oxygen Therapy SpO2:  (could not get sats) O2 Device: Nasal Cannula O2 Flow Rate (L/min): 5 L/min Pain: Pain Assessment Pain Assessment: No/denies pain Pain Score: 0-No pain      Function:  Toileting Toileting             Bed Mobility Roll left and right activity     Roll left and right assistive device: Bedrails  Sit to lying activity   Assist level: Max assist (Pt 25 - 49%)    Lying to sitting activity   Assist level: Maximal assist (Pt 25 - 49%) Lying to sitting assistive device: HOB elevated;Bedrails  Mobility details     Transfers Sit to stand transfer   Sit to stand  assist level: Maximal assist (Pt 25 - 49%/lift and lower)    Chair/bed transfer   Chair/bed transfer method: Stand pivot Chair/bed transfer assist level: 2 helpers     Chair/bed transfer details: Visual cues/gestures for sequencing;Manual facilitation for weight shifting;Manual facilitation for weight bearing;Manual facilitation for placement   Scientist, physiological device: Parallel bars Max distance: 2 Assist level: 2 helpers  Walk 10 feet activity      Walk 50 feet with 2 turns activity      Walk 150 feet activity      Walk 10 feet on uneven surfaces activity      Stairs   Stairs assistive device: 2 hand rails Max number of stairs: 1 Stairs assist level: 2 helpers  Walk up/down 1 step activity     Walk up/down 1 step (curb) assist level: 2 helpers  Walk up/down 4 steps activity Walk up/down 4 steps activity did not occur: Safety/medical concerns (pt extremely apraxic)    Walk up/down 12 steps activity Walk up/down 12 steps activity did not occur: Safety/medical concerns (pt extremely apraxic)    Pick up small objects from floor Pick up small object from the floor (from standing position) activity did not occur: Safety/medical concerns    Wheelchair  Assist Level: Dependent (Pt equals 0%)  Wheel 50 feet with 2 turns activity      Wheel 150 feet activity       Cognition Comprehension Comprehension assist level: Understands basic less than 25% of the time/ requires cueing >75% of the time  Expression Expression assist level: Expresses basis less than 25% of the time/requires cueing >75% of the time.  Social Interaction Social Interaction assist level: Interacts appropriately less than 25% of the time. May be withdrawn or combative.  Problem Solving Problem solving assist level: Solves basic less than 25% of the time - needs direction nearly all the time or does not effectively solve problems  and may need a restraint for safety  Memory Memory assist level: Recognizes or recalls less than 25% of the time/requires cueing greater than 75% of the time    Therapy/Group: Individual Therapy  Alonza Knisley 11/28/2014, 5:56 PM

## 2014-11-28 NOTE — Progress Notes (Signed)
[name] Progress Note  Assessment/Plan: 1. 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 ESRD - TTS, using AVF - still has IJ - if works ok today, can get cath out next week 3. Anemia - Hgb 9.1 pre HD Thurs and up to 11.3 Friday - Aranesp ordered to start on Tuesday - if Hgb stays over 11, will hold; last tsat was 13% 7/30 and Fe not repleted - check Fe levels 4. Secondary hyperparathyroidism - corr Ca 10.1 - Hectorol on hold - iPTH 346 6/07 411 7/02 , fosrenol 500 tid 5. HTN/volume - ok; net UF 1.7 during last HD treatment - pt was not weighed pre or post- overall volume down from admission EDW, CXR 8/26 neg volume 6. Nutrition - nepro at 60 /hr alb 2.4  [name], PA-C [name] [phone] 11/28/2014,10:01 AM  LOS: 3 days   Pt seen, examined and agree w A/P as above.  Stephanie Moselle MD pager [phone]    cell [phone] 11/28/2014, 2:51 PM   Subjective:   nonverbal  Objective Filed Vitals:   11/27/14 2028 11/28/14 0205 11/28/14 0603 11/28/14 0938  BP:   143/84   Pulse:   104   Temp:   98.3 F (36.8 C)   TempSrc:   Oral   Resp:      Weight:      SpO2: 98% 100% 100% 100%   Physical Exam General: sitting in WC Heart: tachy reg ~100 Lungs: dim BS - poor expansion Abdomen: soft GT in place Extremities: no sig LE edema Dialysis Access: right IJ and left AVF + bruit  Dialysis Orders: AF 4h 91kg 2/2 bath Hep none Hect 2 ug Aranesp 200 /wk + venofer 50/wk3   Additional Objective Labs: Basic Metabolic Panel:  Recent Labs Lab 11/23/14 0441 11/24/14 0604 11/27/14 0025  NA 137 139 141  K 3.1* 4.4 3.8  CL 98* 102 99*  CO2 26 25 27   GLUCOSE 96 112* 75  BUN 75* 92* 79*  CREATININE 5.88* 7.44* 7.47*  CALCIUM 8.5* 8.9 8.8*  PHOS  --   --  4.0   Liver Function Tests:  Recent Labs Lab 11/27/14 0025  ALBUMIN 2.4*   CBC:  Recent Labs Lab 11/22/14 0208 11/23/14 0441 11/24/14 0604 11/27/14 0024  11/27/14 1008  WBC 5.5 6.2 6.1 7.6 6.8  NEUTROABS  --   --   --   --  4.3  HGB 10.5* 10.2* 10.0* 9.1* 11.3*  HCT 33.5* 32.1* 31.1* 28.5* 35.7*  MCV 88.6 87.2 86.4 86.1 88.6  PLT 265 295 280 326 301   CBG:  Recent Labs Lab 11/27/14 0539 11/27/14 1200 11/27/14 1822 11/27/14 2350 11/28/14 0634  GLUCAP 105* 102* 94 80 96   Studies/Results: Dg Chest 2 View  11/27/2014   CLINICAL DATA:  Stroke.  Abnormal respiratory rate.  EXAM: CHEST  2 VIEW  COMPARISON:  11/24/2014.  FINDINGS: Dual-lumen catheter noted in stable position. Mediastinum hilar structures are normal. Cardiomegaly with normal pulmonary vascularity. Low lung volumes. No pleural effusion or pneumothorax. No acute bony abnormality.  IMPRESSION: 1. Dual lumen catheter noted in stable position. Stable cardiomegaly. No pulmonary venous congestion. 2. Low lung volumes.  No acute infiltrate.   Electronically Signed   By: Maisie Fus  Register   On: 11/27/2014 08:55   Medications: . feeding supplement (NEPRO CARB STEADY)     . acetylcysteine  3 mL Nebulization BID  . ALPRAZolam  0.5 mg Per Tube Q  T,Th,Sa-HD  . antiseptic oral rinse  7 mL Mouth Rinse QID  . aspirin  325 mg Oral Daily  . budesonide  0.25 mg Nebulization BID  . chlorhexidine gluconate  15 mL Mouth Rinse BID  . [START ON 12/01/2014] darbepoetin (ARANESP) injection - DIALYSIS  100 mcg Intravenous Q Tue-HD  . guaifenesin  200 mg Per Tube TID  . ipratropium-albuterol  3 mL Nebulization Q6H  . lanthanum  500 mg Per Tube TID  . levETIRAcetam  500 mg Oral BID  . levothyroxine  25 mcg Oral QAC breakfast  . multivitamin  1 tablet Oral QHS  . pantoprazole sodium  40 mg Per Tube Q1200

## 2014-11-29 ENCOUNTER — Inpatient Hospital Stay (HOSPITAL_COMMUNITY): Payer: Medicaid Other | Admitting: Physical Therapy

## 2014-11-29 LAB — GLUCOSE, CAPILLARY
GLUCOSE-CAPILLARY: 104 mg/dL — AB (ref 65–99)
GLUCOSE-CAPILLARY: 107 mg/dL — AB (ref 65–99)
Glucose-Capillary: 92 mg/dL (ref 65–99)

## 2014-11-29 MED ORDER — CHLORHEXIDINE GLUCONATE 0.12 % MT SOLN
15.0000 mL | Freq: Two times a day (BID) | OROMUCOSAL | Status: DC
  Administered 2014-11-29 – 2014-12-23 (×45): 15 mL via OROMUCOSAL
  Filled 2014-11-29 (×47): qty 15

## 2014-11-29 MED ORDER — ALPRAZOLAM 0.25 MG PO TABS
0.2500 mg | ORAL_TABLET | Freq: Three times a day (TID) | ORAL | Status: DC | PRN
  Administered 2014-11-29 – 2014-12-23 (×9): 0.25 mg
  Filled 2014-11-29 (×11): qty 1

## 2014-11-29 NOTE — Progress Notes (Addendum)
59 y.o. 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 Interventional radiology, received inpatient rehabilitation services maintain on aspirin 81 mg for CVA prophylaxis. She was discharged to home with family ambulating short household distances using a rolling walker and minimal assist to contact-guard. Presented 11/10/2014 with right-sided weakness and left gaze deviation. Patient did require intubation and mechanical ventilation for acute respiratory failure and extubated 11/20/2014. MRI of the brain showed moderate size left MCA territory infarct affecting the left insula and operculum. Expected evolution of posterior right ACA and posterior left MCA infarcts seen in March. Patient did receive TPA. Echocardiogram with ejection fraction of 65% no wall motion abnormalities.   Subjective/Complaints: Fairly uneventful night. RN was able to titrate oxygen down to 1-2 L while keeping sats in 90's. ROS cannot obtain due to global aphasia  Objective: Vital Signs: Blood pressure 121/77, pulse 64, temperature 98.2 F (36.8 C), temperature source Oral, resp. rate 20, weight 81.2 kg (179 lb 0.2 oz), SpO2 94 %. Dg Chest 2 View  11/27/2014   CLINICAL DATA:  Stroke.  Abnormal respiratory rate.  EXAM: CHEST  2 VIEW  COMPARISON:  11/24/2014.  FINDINGS: Dual-lumen catheter noted in stable position. Mediastinum hilar structures are normal. Cardiomegaly with normal pulmonary vascularity. Low lung volumes. No pleural effusion or pneumothorax. No acute bony abnormality.  IMPRESSION: 1. Dual lumen catheter noted in stable position. Stable cardiomegaly. No pulmonary venous congestion. 2. Low lung volumes.  No acute infiltrate.   Electronically Signed   By: Maisie Fus  Register   On: 11/27/2014 08:55    Results for orders placed or performed during the hospital encounter of 11/25/14 (from the past 72 hour(s))  Glucose, capillary     Status: None   Collection Time: 11/26/14 11:32 AM  Result Value Ref Range   Glucose-Capillary 93 65 - 99 mg/dL  Glucose, capillary     Status: Abnormal   Collection Time: 11/26/14  6:32 PM  Result Value Ref Range   Glucose-Capillary 123 (H) 65 - 99 mg/dL  Glucose, capillary     Status: None   Collection Time: 11/26/14  9:18 PM  Result Value Ref Range   Glucose-Capillary 88 65 - 99 mg/dL  CBC     Status: Abnormal   Collection Time: 11/27/14 12:24 AM  Result Value Ref Range   WBC 7.6 4.0 - 10.5 K/uL   RBC 3.31 (L) 3.87 - 5.11 MIL/uL   Hemoglobin 9.1 (L) 12.0 - 15.0 g/dL   HCT 87.1 (L) 99.4 - 12.9 %   MCV 86.1 78.0 - 100.0 fL   MCH 27.5 26.0 - 34.0 pg   MCHC 31.9 30.0 - 36.0 g/dL   RDW 04.7 (H) 53.3 - 91.7 %   Platelets 326 150 - 400 K/uL  Renal function panel     Status: Abnormal   Collection Time: 11/27/14 12:25 AM  Result Value Ref Range   Sodium 141 135 - 145 mmol/L   Potassium 3.8 3.5 - 5.1 mmol/L   Chloride 99 (L) 101 - 111 mmol/L   CO2 27 22 - 32 mmol/L   Glucose, Bld 75 65 - 99 mg/dL   BUN 79 (H) 6 - 20 mg/dL   Creatinine, Ser 9.21 (H) 0.44 - 1.00 mg/dL   Calcium 8.8 (L) 8.9 - 10.3 mg/dL   Phosphorus 4.0 2.5 - 4.6 mg/dL  Albumin 2.4 (L) 3.5 - 5.0 g/dL   GFR calc non Af Amer 5 (L) >60 mL/min   GFR calc Af Amer 6 (L) >60 mL/min    Comment: (NOTE) The eGFR has been calculated using the CKD EPI equation. This calculation has not been validated in all clinical situations. eGFR's persistently <60 mL/min signify possible Chronic Kidney Disease.    Anion gap 15 5 - 15  Glucose, capillary     Status: Abnormal   Collection Time: 11/27/14  4:43 AM  Result Value Ref Range   Glucose-Capillary 61 (L) 65 - 99 mg/dL  Glucose, capillary     Status: None   Collection Time: 11/27/14  5:02 AM  Result Value Ref Range   Glucose-Capillary 69 65 -  99 mg/dL  Glucose, capillary     Status: Abnormal   Collection Time: 11/27/14  5:39 AM  Result Value Ref Range   Glucose-Capillary 105 (H) 65 - 99 mg/dL  CBC with Differential/Platelet     Status: Abnormal   Collection Time: 11/27/14 10:08 AM  Result Value Ref Range   WBC 6.8 4.0 - 10.5 K/uL   RBC 4.03 3.87 - 5.11 MIL/uL   Hemoglobin 11.3 (L) 12.0 - 15.0 g/dL   HCT 35.7 (L) 36.0 - 46.0 %   MCV 88.6 78.0 - 100.0 fL   MCH 28.0 26.0 - 34.0 pg   MCHC 31.7 30.0 - 36.0 g/dL   RDW 18.4 (H) 11.5 - 15.5 %   Platelets 301 150 - 400 K/uL   Neutrophils Relative % 64 43 - 77 %   Neutro Abs 4.3 1.7 - 7.7 K/uL   Lymphocytes Relative 14 12 - 46 %   Lymphs Abs 1.0 0.7 - 4.0 K/uL   Monocytes Relative 18 (H) 3 - 12 %   Monocytes Absolute 1.3 (H) 0.1 - 1.0 K/uL   Eosinophils Relative 4 0 - 5 %   Eosinophils Absolute 0.3 0.0 - 0.7 K/uL   Basophils Relative 0 0 - 1 %   Basophils Absolute 0.0 0.0 - 0.1 K/uL  Glucose, capillary     Status: Abnormal   Collection Time: 11/27/14 12:00 PM  Result Value Ref Range   Glucose-Capillary 102 (H) 65 - 99 mg/dL   Comment 1 Notify RN   Glucose, capillary     Status: None   Collection Time: 11/27/14  6:22 PM  Result Value Ref Range   Glucose-Capillary 94 65 - 99 mg/dL   Comment 1 Notify RN   Glucose, capillary     Status: None   Collection Time: 11/27/14 11:50 PM  Result Value Ref Range   Glucose-Capillary 80 65 - 99 mg/dL   Comment 1 Notify RN   Glucose, capillary     Status: None   Collection Time: 11/28/14  6:34 AM  Result Value Ref Range   Glucose-Capillary 96 65 - 99 mg/dL   Comment 1 Notify RN   Glucose, capillary     Status: None   Collection Time: 11/28/14 12:01 PM  Result Value Ref Range   Glucose-Capillary 77 65 - 99 mg/dL   Comment 1 Notify RN   Renal function panel     Status: Abnormal   Collection Time: 11/28/14  6:51 PM  Result Value Ref Range   Sodium 136 135 - 145 mmol/L   Potassium 4.0 3.5 - 5.1 mmol/L   Chloride 97 (L) 101 - 111  mmol/L   CO2 28 22 - 32 mmol/L   Glucose, Bld 115 (H)  65 - 99 mg/dL   BUN 51 (H) 6 - 20 mg/dL   Creatinine, Ser 6.29 (H) 0.44 - 1.00 mg/dL   Calcium 8.9 8.9 - 10.3 mg/dL   Phosphorus 3.7 2.5 - 4.6 mg/dL   Albumin 2.4 (L) 3.5 - 5.0 g/dL   GFR calc non Af Amer 7 (L) >60 mL/min   GFR calc Af Amer 8 (L) >60 mL/min    Comment: (NOTE) The eGFR has been calculated using the CKD EPI equation. This calculation has not been validated in all clinical situations. eGFR's persistently <60 mL/min signify possible Chronic Kidney Disease.    Anion gap 11 5 - 15  CBC     Status: Abnormal   Collection Time: 11/28/14  6:51 PM  Result Value Ref Range   WBC 8.2 4.0 - 10.5 K/uL   RBC 3.71 (L) 3.87 - 5.11 MIL/uL   Hemoglobin 10.2 (L) 12.0 - 15.0 g/dL   HCT 32.9 (L) 36.0 - 46.0 %   MCV 88.7 78.0 - 100.0 fL   MCH 27.5 26.0 - 34.0 pg   MCHC 31.0 30.0 - 36.0 g/dL   RDW 18.6 (H) 11.5 - 15.5 %   Platelets 341 150 - 400 K/uL  Glucose, capillary     Status: None   Collection Time: 11/28/14 11:33 PM  Result Value Ref Range   Glucose-Capillary 99 65 - 99 mg/dL  Glucose, capillary     Status: Abnormal   Collection Time: 11/29/14  5:56 AM  Result Value Ref Range   Glucose-Capillary 104 (H) 65 - 99 mg/dL     HEENT: poor oral movements  facial diplegia Cardio: RRR and no murmur Resp: CTA B/L. Non-labored but still with slightl tachypnea (low 20's) GI: BS positive and NT, ND, pior miline abd incision with extensive scarring Extremity:  Pulses positive and No Edema Skin:   Other G tube site clean Neuro: Flat but very alert, Cranial Nerve Abnormalities bilateral central 7, Abnormal Sensory some withdrawl to pinch in all 4 ext, Abnormal Motor 0/5 in RUE, 2- LUE, trace R and left knee ext, Abnormal FMC Tone  increased ext tone in BLE, increased flexor tone in RUE, Aphasic and Apraxic Musc/Skel:  Other decrease ROM in BIlateral knees and Right shoulder and elbow due to tone Gen NAD   Assessment/Plan: 1.  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 Physiatrist is providing close team supervision and 24 hour management of active medical problems listed below. Physiatrist and rehab team continue to assess barriers to discharge/monitor patient progress toward functional and medical goals. FIM: Function - Bathing Position: Wheelchair/chair at sink Body parts bathed by helper: Right arm, Left arm, Chest, Abdomen, Front perineal area, Buttocks, Right upper leg, Left upper leg, Right lower leg, Left lower leg, Back Assist Level: 2 helpers  Function- Upper Body Dressing/Undressing What is the patient wearing?: Pull over shirt/dress Pull over shirt/dress - Perfomed by helper: Thread/unthread right sleeve, Thread/unthread left sleeve, Put head through opening, Pull shirt over trunk Function - Lower Body Dressing/Undressing What is the patient wearing?: Non-skid slipper socks, Pants Position: Wheelchair/chair at sink Pants- Performed by helper: Thread/unthread right pants leg, Thread/unthread left pants leg, Pull pants up/down Non-skid slipper socks- Performed by helper: Don/doff right sock, Don/doff left sock  Function - Toileting Toileting activity did not occur: No continent bowel/bladder event Toileting  steps completed by helper: Adjust clothing prior to toileting, Performs perineal hygiene, Adjust clothing after toileting Assist level: Two helpers  Function - Air cabin crew transfer activity did not occur: Safety/medical concerns  Function - Chair/bed transfer Chair/bed transfer method: Stand pivot Chair/bed transfer assist level: 2 helpers Chair/bed transfer details: Visual cues/gestures for sequencing, Manual facilitation for weight shifting, Manual facilitation for  weight bearing, Manual facilitation for placement  Function - Locomotion: Wheelchair Will patient use wheelchair at discharge?: Yes Wheelchair activity did not occur:  (Attempted hand-over-hand facilitation for w/c propulsion, visual demonstration, however no pt initiation evident) Assist Level: Dependent (Pt equals 0%) Function - Locomotion: Ambulation Ambulation activity did not occur: Safety/medical concerns (Attempted ambulation, however upon standing x3 reps pt unable to step (unable to determine if due to$Remove' \\apraxia'bPmuWAN$  or pt fearful)) Assistive device: Parallel bars Max distance: 2 Assist level: 2 helpers Walk 10 feet activity did not occur: Safety/medical concerns Walk 50 feet with 2 turns activity did not occur: Safety/medical concerns Walk 150 feet activity did not occur: Safety/medical concerns Walk 10 feet on uneven surfaces activity did not occur: Safety/medical concerns  Function - Comprehension Comprehension: Auditory Comprehension assist level: Understands basic less than 25% of the time/ requires cueing >75% of the time  Function - Expression Expression: Nonverbal Expression assist level: Expresses basis less than 25% of the time/requires cueing >75% of the time.  Function - Social Interaction Social Interaction assist level: Interacts appropriately less than 25% of the time. May be withdrawn or combative.  Function - Problem Solving Problem solving assist level: Solves basic less than 25% of the time - needs direction nearly all the time or does not effectively solve problems and may need a restraint for safety  Function - Memory Memory assist level: Recognizes or recalls less than 25% of the time/requires cueing greater than 75% of the time Patient normally able to recall (first 3 days only): None of the above  Medical Problem List and Plan: 1. Functional deficits secondary to left MCA infarct/respiratory failure. Extubated 11/20/2014.  2. DVT  Prophylaxis/Anticoagulation: SCDs. Monitor for any signs of DVT. 8/12 venous dopplers neg 3. Pain Management: Tylenol as needed 4. Dysphagia. Patient is nothing by mouth. Status post gastrostomy tube 08/12/2014 per interventional radiology for nutritional support. poor prognosis for improvement given severe bilateral deficits 5. Neuropsych: This patient is not capable of making decisions on her own behalf. 6. Skin/Wound Care: Routine skin checks. PEG site maintenance. continue turning and nutrition 7. Fluids/Electrolytes/Nutrition: Routine I&O. Adjust TF based on nutritional needs. Renal adjusting dialysis, complicated situation 8. End-stage renal disease with hemodialysis. Follow-up per renal services. Hemodialysis in PM after therapies to avoid conflict 9. Seizure prophylaxis. Keppra 500 mg twice a day, monitor drug toxicity. No seizures 10. Hypothyroidism. Synthroid 11. Chronic anemia. Aranesp weekly with dialysis, hgb 9.1 on 8/26 12. History of asthma. Continue nebulizers as needed. Anxiety component also.  13. Mood/anxiety. Continue Xanax on dialysis days 14.  Hx of recurrent resp failure, high risk for PNA given comorbid conditions,  RR increases at times. Comfortable. Secretions better. Afebrile. No distress.  -continue to aggressively monitor, aspiration precautions,  -wean oxygen down to keep sats >90% 15.  Bowel incont- monitor skin LOS (Days) 4 A FACE TO FACE EVALUATION WAS PERFORMED  Mosella Kasa T 11/29/2014, 8:31 AM

## 2014-11-29 NOTE — Progress Notes (Signed)
Returned from HD at 2315. O2 at 6L/m via  after returning from HD, O2 sat-100%, Titrated down to 1.5L with sats maintaining at 94%. Tachypnea noted at times. Scheduled neb treatments given. Expiratory wheeze and crackles.  Incontinent of soft stool x 2 during night and incontinent of urine. Urine malodorous. Nepro infusing without problems, residuals=0. [name] A

## 2014-11-29 NOTE — Progress Notes (Signed)
Physical Therapy Session Note  Patient Details  Name: [name] MRN: [id number] Date of Birth: July 12, 1955  Today's Date: 11/29/2014 PT Individual Time: 1000-1100 PT Individual Time Calculation (min): 60 min   Short Term Goals: Week 1:  PT Short Term Goal 1 (Week 1): Will perform bed mobility with modA and use of bedrails PT Short Term Goal 2 (Week 1): Will perform supine <>sit maxA +1 and mod cues PT Short Term Goal 3 (Week 1): Will perform stand pivot transfer modA PT Short Term Goal 4 (Week 1): Will perform ambulation x25' with LRAD and maxA +1 PT Short Term Goal 5 (Week 1): Will perform w/c propulsion x50' with modA  Skilled Therapeutic Interventions/Progress Updates:  Pt was seen bedside in the am. Pt alert able to automatic simple commands with increased processing time. Pt ambulated x 3 for distances of 17, 25 and 25 feet with max A and second person to assist with O2 and feeding tube. Treatment also focused on NMR in sitting and standing, worked on midline and weight shifting. Pt did better with demonstrated commands. Following treatment pt left sitting up in w/c with quick release belt in place at nurses station.   Therapy Documentation Precautions:  Precautions Precautions: Fall Precaution Comments: fall, NPO, pusher to the right at times, non-verbal,  Restrictions Weight Bearing Restrictions: No General:   Pain: No signs of pain noted during treatment.   Function:    Bed Mobility Roll left and right activity   Assist level: Total assist (Pt < 25%)    Sit to lying activity   Assist level: Max assist (Pt 25 - 49%)    Lying to sitting activity   Assist level: Maximal assist (Pt 25 - 49%)    Mobility details     Transfers Sit to stand transfer   Sit to stand assist level: Maximal assist (Pt 25 - 49%/lift and lower)    Chair/bed transfer   Chair/bed transfer method: Stand pivot Chair/bed transfer assist level: Maximal assist (Pt 25 - 49%/lift and lower)                 Locomotion Ambulation     Max distance: 25 Assist level: 2 helpers  Walk 10 feet activity   Assist level: 2 helpers                                    Cognition Comprehension Comprehension assist level: Understands basic less than 25% of the time/ requires cueing >75% of the time  Expression Expression assist level: Expresses basis less than 25% of the time/requires cueing >75% of the time.  Social Interaction Social Interaction assist level: Interacts appropriately less than 25% of the time. May be withdrawn or combative.  Problem Solving Problem solving assist level: Solves basic less than 25% of the time - needs direction nearly all the time or does not effectively solve problems and may need a restraint for safety  Memory Memory assist level: Recognizes or recalls less than 25% of the time/requires cueing greater than 75% of the time    Therapy/Group: Individual Therapy  Rayford Halsted 11/29/2014, 12:39 PM

## 2014-11-30 ENCOUNTER — Inpatient Hospital Stay (HOSPITAL_COMMUNITY): Payer: Medicaid Other | Admitting: Occupational Therapy

## 2014-11-30 ENCOUNTER — Encounter: Payer: Self-pay | Admitting: Vascular Surgery

## 2014-11-30 ENCOUNTER — Inpatient Hospital Stay (HOSPITAL_COMMUNITY): Payer: Medicaid Other | Admitting: Speech Pathology

## 2014-11-30 ENCOUNTER — Inpatient Hospital Stay (HOSPITAL_COMMUNITY): Payer: Medicaid Other | Admitting: Physical Therapy

## 2014-11-30 LAB — GLUCOSE, CAPILLARY
GLUCOSE-CAPILLARY: 100 mg/dL — AB (ref 65–99)
GLUCOSE-CAPILLARY: 105 mg/dL — AB (ref 65–99)
GLUCOSE-CAPILLARY: 97 mg/dL (ref 65–99)
Glucose-Capillary: 95 mg/dL (ref 65–99)

## 2014-11-30 NOTE — Plan of Care (Signed)
Problem: RH BOWEL ELIMINATION Goal: RH STG MANAGE BOWEL WITH ASSISTANCE STG Manage Bowel with Mod.Assistance.  Outcome: Not Progressing Incontinent of stool, requiring total assist with hygiene. Goal: RH STG MANAGE BOWEL W/MEDICATION W/ASSISTANCE STG Manage Bowel with Medication with Mod. Assistance.  Outcome: Progressing Total assist with med management.  Problem: RH SKIN INTEGRITY Goal: RH STG SKIN FREE OF INFECTION/BREAKDOWN With Mod. Assisst  Outcome: Not Progressing Total care. Goal: RH STG MAINTAIN SKIN INTEGRITY WITH ASSISTANCE STG Maintain Skin Integrity With Mod. Assistance.  Outcome: Not Progressing Total care.

## 2014-11-30 NOTE — Progress Notes (Signed)
Physical Therapy Session Note  Patient Details  Name: [name] MRN: [id number] Date of Birth: [birth year]  Today's Date: 11/30/2014 PT Individual Time: 0800-0905 PT Individual Time Calculation (min): 65 min   Short Term Goals: Week 1:  PT Short Term Goal 1 (Week 1): Will perform bed mobility with modA and use of bedrails PT Short Term Goal 2 (Week 1): Will perform supine <>sit maxA +1 and mod cues PT Short Term Goal 3 (Week 1): Will perform stand pivot transfer modA PT Short Term Goal 4 (Week 1): Will perform ambulation x25' with LRAD and maxA +1 PT Short Term Goal 5 (Week 1): Will perform w/c propulsion x50' with modA  Skilled Therapeutic Interventions/Progress Updates:    [phone]: Pt received supine in bed with visible signs of discomfort as described below, however pt unable to verbalize pain. Nodded head yes when questioned if she was hurting, however unable to respond to further questioning regarding location of pain. Asked again if pt is in pain and pt gives no response. Resting vitals HR 99, O2 97% on 2L o2 via [place], BP 142/80. Supine >sit with modA and HOB elevated, use of bedrails. Transfer stand pivot bed >w/c maxA and bed elevated to increase success with sit <> stand. Sit <>stand x4 reps in hallway with RW in front of pt to assist with facilitating gait, however pt able to take small L step and unable to progress RLE with facilitation for weight shift. Sit <>stand in parallel bars x3 reps to improve forward trunk lean with UEs extended in front of pt. Gait x10' forward with maxA +2 and facilitation for weight shifting and LE progression. Requires seated rest break after completion of trial before attempting backwards walking to w/c. Increase respiration rate following activity, O2 measured at 98% and HR 106 bpm.  Backwards walking to w/c x10' with maxA +2 and increased assistance and cueing needed for LE progression. Pt returned to room and remained seated in w/c with QR belt intact  and all needs within reach.   1300-1330: Pt received seated in w/c with daughter present; daughter states pt was frustrated earlier in the day with speech therapy and feels pt is starting to realize what is going on and how severe her stroke was. Discussed with daughter morning session and difficulty interpreting facial expressions, discomfort, etc. Gait training x2 trials of 95' with +2 assist under each UE to improve pt confidence and provide increased support. Noted shortened step length bilaterally R>L, scissoring, forward hip/trunk flexion. Seated rest breaks between trials. Pt initially unwilling to ambulate second time, however after further explanation to pt of short distance goal pt initiates sit >stand and completes 40' gait. Pt returned to room totalA; pt shakes head no when offered to return to bed, shakes head yes when asked if she wants to stay in chair. Pt remained seated in chair with QR belt intact, R lap tray intact, and soft call bell in LUE at completion of session. RN alerted to pt position.  Therapy Documentation Precautions:  Precautions Precautions: Fall Precaution Comments: fall, NPO, pusher to the right at times, non-verbal,  Restrictions Weight Bearing Restrictions: No Pain: PAINAD (Pain Assessment in Advanced Dementia) Breathing: occasional labored breathing, short period of hyperventilation Negative Vocalization: none Facial Expression: sad, frightened, frown Body Language: tense, distressed pacing, fidgeting Consolability: no need to console PAINAD Score: 3   Function:   Bed Mobility Roll left and right activity        Sit to lying activity  Lying to sitting activity   Assist level: Moderate assist (Pt 50 - 74%, lift 2 legs) Lying to sitting assistive device: HOB elevated;Bedrails  Mobility details Bed mobility details: Tactile cues for initiation;Tactile cues for weight shifting;Visual cues/gestures for sequencing   Transfers Sit to stand  transfer   Sit to stand assist level: Maximal assist (Pt 25 - 49%/lift and lower)    Chair/bed transfer   Chair/bed transfer method: Stand pivot Chair/bed transfer assist level: Maximal assist (Pt 25 - 49%/lift and lower)     Chair/bed transfer details: Visual cues/gestures for sequencing;Manual facilitation for weight shifting;Manual facilitation for weight bearing;Manual facilitation for placement   Scientist, physiological device: Parallel bars Max distance: 10 Assist level: 2 helpers  Walk 10 feet activity   Assist level: 2 helpers  Walk 50 feet with 2 turns activity      Walk 150 feet activity      Walk 10 feet on uneven surfaces activity      Stairs          Walk up/down 1 step activity        Walk up/down 4 steps activity      Walk up/down 12 steps activity      Pick up small objects from floor      Wheelchair          Wheel 50 feet with 2 turns activity      Wheel 150 feet activity        Therapy/Group: Individual Therapy  [name] 11/30/2014, 9:21 AM

## 2014-11-30 NOTE — Progress Notes (Signed)
Incontinent of  1 small stool during night. Stool mushy and in vaginal area. Does not call when incontinent. Old abdominal incision with scab and couple blue sutures noted. Bilateral heels elevated off bed. [name] A

## 2014-11-30 NOTE — Progress Notes (Signed)
Subjective/Complaints: RR remains elevated  O2 sats stable with movement Hr 98 at rest and 106 after activity- good ROS cannot obtain due to global aphasia  Per PT amb in parallel bars this am, +2 assist! Objective: Vital Signs: Blood pressure 122/81, pulse 86, temperature 98.9 F (37.2 C), temperature source Oral, resp. rate 19, weight 81.3 kg (179 lb 3.7 oz), SpO2 97 %. No results found. Results for orders placed or performed during the hospital encounter of 11/25/14 (from the past 72 hour(s))  CBC with Differential/Platelet     Status: Abnormal   Collection Time: 11/27/14 10:08 AM  Result Value Ref Range   WBC 6.8 4.0 - 10.5 K/uL   RBC 4.03 3.87 - 5.11 MIL/uL   Hemoglobin 11.3 (L) 12.0 - 15.0 g/dL   HCT 35.7 (L) 36.0 - 46.0 %   MCV 88.6 78.0 - 100.0 fL   MCH 28.0 26.0 - 34.0 pg   MCHC 31.7 30.0 - 36.0 g/dL   RDW 18.4 (H) 11.5 - 15.5 %   Platelets 301 150 - 400 K/uL   Neutrophils Relative % 64 43 - 77 %   Neutro Abs 4.3 1.7 - 7.7 K/uL   Lymphocytes Relative 14 12 - 46 %   Lymphs Abs 1.0 0.7 - 4.0 K/uL   Monocytes Relative 18 (H) 3 - 12 %   Monocytes Absolute 1.3 (H) 0.1 - 1.0 K/uL   Eosinophils Relative 4 0 - 5 %   Eosinophils Absolute 0.3 0.0 - 0.7 K/uL   Basophils Relative 0 0 - 1 %   Basophils Absolute 0.0 0.0 - 0.1 K/uL  Glucose, capillary     Status: Abnormal   Collection Time: 11/27/14 12:00 PM  Result Value Ref Range   Glucose-Capillary 102 (H) 65 - 99 mg/dL   Comment 1 Notify RN   Glucose, capillary     Status: None   Collection Time: 11/27/14  6:22 PM  Result Value Ref Range   Glucose-Capillary 94 65 - 99 mg/dL   Comment 1 Notify RN   Glucose, capillary     Status: None   Collection Time: 11/27/14 11:50 PM  Result Value Ref Range   Glucose-Capillary 80 65 - 99 mg/dL   Comment 1 Notify RN   Glucose, capillary     Status: None   Collection Time: 11/28/14  6:34 AM  Result Value Ref Range   Glucose-Capillary 96 65 - 99 mg/dL   Comment 1 Notify RN    Glucose, capillary     Status: None   Collection Time: 11/28/14 12:01 PM  Result Value Ref Range   Glucose-Capillary 77 65 - 99 mg/dL   Comment 1 Notify RN   Renal function panel     Status: Abnormal   Collection Time: 11/28/14  6:51 PM  Result Value Ref Range   Sodium 136 135 - 145 mmol/L   Potassium 4.0 3.5 - 5.1 mmol/L   Chloride 97 (L) 101 - 111 mmol/L   CO2 28 22 - 32 mmol/L   Glucose, Bld 115 (H) 65 - 99 mg/dL   BUN 51 (H) 6 - 20 mg/dL   Creatinine, Ser 6.29 (H) 0.44 - 1.00 mg/dL   Calcium 8.9 8.9 - 10.3 mg/dL   Phosphorus 3.7 2.5 - 4.6 mg/dL   Albumin 2.4 (L) 3.5 - 5.0 g/dL   GFR calc non Af Amer 7 (L) >60 mL/min   GFR calc Af Amer 8 (L) >60 mL/min    Comment: (NOTE) The eGFR has  been calculated using the CKD EPI equation. This calculation has not been validated in all clinical situations. eGFR's persistently <60 mL/min signify possible Chronic Kidney Disease.    Anion gap 11 5 - 15  CBC     Status: Abnormal   Collection Time: 11/28/14  6:51 PM  Result Value Ref Range   WBC 8.2 4.0 - 10.5 K/uL   RBC 3.71 (L) 3.87 - 5.11 MIL/uL   Hemoglobin 10.2 (L) 12.0 - 15.0 g/dL   HCT 32.9 (L) 36.0 - 46.0 %   MCV 88.7 78.0 - 100.0 fL   MCH 27.5 26.0 - 34.0 pg   MCHC 31.0 30.0 - 36.0 g/dL   RDW 18.6 (H) 11.5 - 15.5 %   Platelets 341 150 - 400 K/uL  Glucose, capillary     Status: None   Collection Time: 11/28/14 11:33 PM  Result Value Ref Range   Glucose-Capillary 99 65 - 99 mg/dL  Glucose, capillary     Status: Abnormal   Collection Time: 11/29/14  5:56 AM  Result Value Ref Range   Glucose-Capillary 104 (H) 65 - 99 mg/dL  Glucose, capillary     Status: Abnormal   Collection Time: 11/29/14 12:02 PM  Result Value Ref Range   Glucose-Capillary 107 (H) 65 - 99 mg/dL   Comment 1 Notify RN   Glucose, capillary     Status: None   Collection Time: 11/29/14  5:58 PM  Result Value Ref Range   Glucose-Capillary 92 65 - 99 mg/dL   Comment 1 Notify RN   Glucose, capillary      Status: Abnormal   Collection Time: 11/30/14 12:00 AM  Result Value Ref Range   Glucose-Capillary 100 (H) 65 - 99 mg/dL  Glucose, capillary     Status: Abnormal   Collection Time: 11/30/14  6:28 AM  Result Value Ref Range   Glucose-Capillary 105 (H) 65 - 99 mg/dL     HEENT: poor oral movements  facial diplegia Cardio: RRR and no murmur Resp: CTA B/L. Mild use of accesory muscles, exp wheezesbut still with slightl tachypnea ~19 this am GI: BS positive and NT, ND, pior miline abd incision with extensive scarring Extremity:  Pulses positive and No Edema Skin:   Other G tube site NT Neuro: Flat but very alert, Cranial Nerve Abnormalities bilateral central 7, Abnormal Sensory some withdrawl to pinch in all 4 ext, Abnormal Motor 0/5 in RUE, 2- LUE, trace R and left knee ext, Abnormal FMC Tone  increased ext tone in BLE, increased flexor tone in RUE, Aphasic and Apraxic Musc/Skel:  Other decrease ROM in BIlateral knees and Right shoulder and elbow due to tone Gen NAD   Assessment/Plan: 1. 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 Physiatrist is providing close team supervision and 24 hour management of active medical problems listed below. Physiatrist and rehab team continue to assess barriers to discharge/monitor patient progress toward functional and medical goals. FIM: Function - Bathing Position: Wheelchair/chair at sink Body parts bathed by helper: Right arm, Left arm, Chest, Abdomen, Front perineal area, Buttocks, Right upper leg, Left upper leg, Right lower leg, Left lower leg, Back Assist Level: 2 helpers  Function- Upper Body Dressing/Undressing What is the patient wearing?: Pull over shirt/dress Pull over shirt/dress - Perfomed by helper: Thread/unthread right  sleeve, Thread/unthread left sleeve, Put head  through opening, Pull shirt over trunk Function - Lower Body Dressing/Undressing What is the patient wearing?: Non-skid slipper socks, Pants Position: Wheelchair/chair at sink Pants- Performed by helper: Thread/unthread right pants leg, Thread/unthread left pants leg, Pull pants up/down Non-skid slipper socks- Performed by helper: Don/doff right sock, Don/doff left sock  Function - Toileting Toileting activity did not occur: No continent bowel/bladder event Toileting steps completed by helper: Adjust clothing prior to toileting, Performs perineal hygiene, Adjust clothing after toileting Assist level: Two helpers  Function - Air cabin crew transfer activity did not occur: Safety/medical concerns  Function - Chair/bed transfer Chair/bed transfer method: Stand pivot Chair/bed transfer assist level: Maximal assist (Pt 25 - 49%/lift and lower) Chair/bed transfer details: Visual cues/gestures for sequencing, Manual facilitation for weight shifting, Manual facilitation for weight bearing, Manual facilitation for placement  Function - Locomotion: Wheelchair Will patient use wheelchair at discharge?: Yes Wheelchair activity did not occur:  (Attempted hand-over-hand facilitation for w/c propulsion, visual demonstration, however no pt initiation evident) Assist Level: Dependent (Pt equals 0%) Function - Locomotion: Ambulation Ambulation activity did not occur: Safety/medical concerns (Attempted ambulation, however upon standing x3 reps pt unable to step (unable to determine if due to$Remove' \\apraxia'[id number]$  or pt fearful)) Assistive device: Parallel bars Max distance: 25 Assist level: 2 helpers Walk 10 feet activity did not occur: Safety/medical concerns Assist level: 2 helpers Walk 50 feet with 2 turns activity did not occur: Safety/medical concerns Walk 150 feet activity did not occur: Safety/medical concerns Walk 10 feet on uneven surfaces activity did  not occur: Safety/medical concerns  Function - Comprehension Comprehension: Auditory Comprehension assist level: Understands basic less than 25% of the time/ requires cueing >75% of the time  Function - Expression Expression: Nonverbal Expression assist level: Expresses basis less than 25% of the time/requires cueing >75% of the time.  Function - Social Interaction Social Interaction assist level: Interacts appropriately less than 25% of the time. May be withdrawn or combative.  Function - Problem Solving Problem solving assist level: Solves basic less than 25% of the time - needs direction nearly all the time or does not effectively solve problems and may need a restraint for safety  Function - Memory Memory assist level: Recognizes or recalls less than 25% of the time/requires cueing greater than 75% of the time Patient normally able to recall (first 3 days only): None of the above  Medical Problem List and Plan: 1. Functional deficits secondary to left MCA infarct/respiratory failure. Extubated 11/20/2014. RR tends to be elevated but sats ok, repeat CXR without signs of fluid overload or infiltrate 2. DVT Prophylaxis/Anticoagulation: SCDs. Monitor for any signs of DVT. 8/12 venous dopplers neg 3. Pain Management: Tylenol as needed, no apparent pain during PT 4. Dysphagia. Patient is nothing by mouth. Status post gastrostomy tube 08/12/2014 per interventional radiology for nutritional support. poor prognosis for improvement given severe bilateral deficits 5. Neuropsych: This patient is not capable of making decisions on her own behalf. 6. Skin/Wound Care: Routine skin checks. PEG site maintenance. continue turning and nutrition 7. Fluids/Electrolytes/Nutrition: Routine I&O. Adjust TF based on nutritional needs. Renal adjusting dialysis, complicated situation 8. End-stage renal disease with hemodialysis. Follow-up per renal services. Hemodialysis in PM after therapies to avoid  conflict 9. Seizure prophylaxis. Keppra 500 mg twice a day, monitor drug toxicity. No seizures 10. Hypothyroidism. Synthroid 11. Chronic anemia. Aranesp weekly with dialysis, hgb 10.2 on 8/27 12. History of asthma. Schedule nebulizers given aphasia. Anxiety component also.  13. Mood/anxiety. Continue Xanax on dialysis  days 14.  Hx of recurrent resp failure, high risk for PNA given comorbid conditions,  RR increases at times. Comfortable. Secretions better. Afebrile. No distress.  -continue to aggressively monitor, aspiration precautions,  -wean oxygen down to keep sats >90% 15.  Bowel incont- monitor skin LOS (Days) 5 A FACE TO FACE EVALUATION WAS PERFORMED  KIRSTEINS,ANDREW E 11/30/2014, 8:27 AM

## 2014-11-30 NOTE — Progress Notes (Signed)
[facility] ROUNDING NOTE   Subjective:   Interval History: appears comfortable today -- aphasia  Objective:  Vital signs in last 24 hours:  Temp:  [98.9 F (37.2 C)] 98.9 F (37.2 C) (08/29 0300) Pulse Rate:  [86-99] 86 (08/29 0300) Resp:  [19-20] 19 (08/29 0300) BP: (122-129)/(81-85) 122/81 mmHg (08/29 0300) SpO2:  [97 %-100 %] 97 % (08/29 0300) Weight:  [81.3 kg (179 lb 3.7 oz)] 81.3 kg (179 lb 3.7 oz) (08/29 0300)  Weight change: -1.9 kg (-4 lb 3 oz) Filed Weights   11/28/14 2227 11/29/14 0508 11/30/14 0300  Weight: 80.2 kg (176 lb 12.9 oz) 81.2 kg (179 lb 0.2 oz) 81.3 kg (179 lb 3.7 oz)    Intake/Output: I/O last 3 completed shifts: In: -  Out: 2008 [Other:2008]   Intake/Output this shift:     CVS- RRR RS- CTA ABD- BS present soft non-distended EXT- no edema R IJ and Left AVF  + bruit  Basic Metabolic Panel:  Recent Labs Lab 11/24/14 0604 11/27/14 0025 11/28/14 1851  NA 139 141 136  K 4.4 3.8 4.0  CL 102 99* 97*  CO2 GLUCOSE 112* 75 115*  BUN 92* 79* 51*  CREATININE 7.44* 7.47* 6.29*  CALCIUM 8.9 8.8* 8.9  PHOS  --  4.0 3.7    Liver Function Tests:  Recent Labs Lab 11/27/14 0025 11/28/14 1851  ALBUMIN 2.4* 2.4*   No results for input(s): LIPASE, AMYLASE in the last 168 hours. No results for input(s): AMMONIA in the last 168 hours.  CBC:  Recent Labs Lab 11/24/14 0604 11/27/14 0024 11/27/14 1008 11/28/14 1851  WBC 6.1 7.6 6.8 8.2  NEUTROABS  --   --  4.3  --   HGB 10.0* 9.1* 11.3* 10.2*  HCT 31.1* 28.5* 35.7* 32.9*  MCV 86.4 86.1 88.6 88.7  PLT 280 326 301 341    Cardiac Enzymes: No results for input(s): CKTOTAL, CKMB, CKMBINDEX, TROPONINI in the last 168 hours.  BNP: Invalid input(s): POCBNP  CBG:  Recent Labs Lab 11/29/14 0556 11/29/14 1202 11/29/14 1758 11/30/14 11/30/14 0628  GLUCAP 104* 107* 92 100* 105*    Microbiology: Results for orders placed or performed during the hospital  encounter of 11/10/14  MRSA PCR Screening     Status: None   Collection Time: 11/10/14  3:00 PM  Result Value Ref Range Status   MRSA by PCR NEGATIVE NEGATIVE Final    Comment:        The GeneXpert MRSA Assay (FDA approved for NASAL specimens only), is one component of a comprehensive MRSA colonization surveillance program. It is not intended to diagnose MRSA infection nor to guide or monitor treatment for MRSA infections.     Coagulation Studies: No results for input(s): LABPROT, INR in the last 72 hours.  Urinalysis: No results for input(s): COLORURINE, LABSPEC, PHURINE, GLUCOSEU, HGBUR, BILIRUBINUR, KETONESUR, PROTEINUR, UROBILINOGEN, NITRITE, LEUKOCYTESUR in the last 72 hours.  Invalid input(s): APPERANCEUR    Imaging: No results found.   Medications:   . feeding supplement (NEPRO CARB STEADY) 1,000 mL (11/28/14 2350)   . acetylcysteine  3 mL Nebulization BID  . ALPRAZolam  0.5 mg Per Tube Q T,Th,Sa-HD  . antiseptic oral rinse  7 mL Mouth Rinse QID  . aspirin  325 mg Oral Daily  . budesonide  0.25 mg Nebulization BID  . chlorhexidine  15 mL Mouth/Throat BID  . [START ON 12/01/2014] darbepoetin (ARANESP) injection - DIALYSIS  100 mcg Intravenous Q  Tue-HD  . guaifenesin  200 mg Per Tube TID  . ipratropium-albuterol  3 mL Nebulization Q6H  . lanthanum  500 mg Per Tube TID  . levETIRAcetam  500 mg Oral BID  . levothyroxine  25 mcg Oral QAC breakfast  . multivitamin  1 tablet Oral QHS  . pantoprazole sodium  40 mg Per Tube Q1200   acetaminophen, ALPRAZolam, atropine, ipratropium-albuterol, ondansetron **OR** ondansetron (ZOFRAN) IV, sorbitol  Assessment/ Plan:  Assessment/Plan: 1. 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 ESRD - TTS, using AVF -  IJ could be removed 3. Anemia -  Will follow 4. Secondary hyperparathyroidism -  Hypercalcemia and holding Vitamin D  5. HTN/volume - ok; net UF 6. Nutrition - nepro at 60 /hr alb 2.4     LOS:  5 Jachai Okazaki W @TODAY @7 :55 AM

## 2014-11-30 NOTE — Progress Notes (Signed)
Occupational Therapy Session Note  Patient Details  Name: [name] MRN: [id number] Date of Birth: [birth year]  Today's Date: 11/30/2014 OT Individual Time: 1000-1100 OT Individual Time Calculation (min): 60 min    Short Term Goals: Week 1:  OT Short Term Goal 1 (Week 1): Pt will maintain static sittting balance for 10 mins with no more than mod assist in preparation for selfcare tasks.   OT Short Term Goal 2 (Week 1): Pt will complete 2 grooming tasks with no more than mod demonstrational cueing for initiation and completion. OT Short Term Goal 3 (Week 1): Pt will perform UB bathing with mod assist sitting supported. OT Short Term Goal 4 (Week 1): Pt will use the RUE for washing face or UB with no more than mod assist. OT Short Term Goal 5 (Week 1): Pt will perform toilet transfer stand pivot with no more than mod assist.   Skilled Therapeutic Interventions/Progress Updates:    ADL retraining with focus on initiation, functional use of either UE, and sit > stand.  Pt flat with no obvious interaction or recognition of familiar therapist during session.  Required hand over hand assist with LUE and RUE and increased wait time to allow initiation with self-care tasks of grooming, bathing, and dressing.  Pt with intermittent use of LUE and attempts to place hairbrush into Rt hand.  Tactile, verbal, and hand over hand to attempt to engage in bathing with pt unable to complete UB bathing, but with setup and cues pt washed both thighs.  Max assist sit > stand with tactile and verbal cues for forward weight shift, +2 for LB hygiene and dressing due to lean to Rt in standing and decreased initiation with standing or clothing management.  Hand over hand assist and guiding with pt able to pull shirt over head with LUE, while therapist also guided shirt.  Pt left seated in w/c with all needs in reach awaiting SLP.  Therapy Documentation Precautions:  Precautions Precautions: Fall Precaution Comments:  fall, NPO, pusher to the right at times, non-verbal,  Restrictions Weight Bearing Restrictions: No General:   Vital Signs: Oxygen Therapy SpO2: 96 % O2 Device: Nasal Cannula O2 Flow Rate (L/min): 2 L/min Pain: PAINAD (Pain Assessment in Advanced Dementia) Breathing: occasional labored breathing, short period of hyperventilation Negative Vocalization: none Facial Expression: sad, frightened, frown Body Language: tense, distressed pacing, fidgeting Consolability: no need to console PAINAD Score: 3  Function:    Grooming Oral Care,Brush Teeth, Clean Dentures Activity:             Wash, Rinse, Dry Face Activity   Assist Level: Helper performed activity      Wash, Rinse, Dry Hands Activity   Assist Level: Helper performed activity      Brush, Comb Hair Activity   Assist Level: Helper performed activity    Shave Activity          Apply Makeup Activity                                                             Bathing Bathing position   Position: Wheelchair/chair at sink  Bathing parts Body parts bathed by patient: Right upper leg;Left upper leg Body parts bathed by helper: Right arm;Left arm;Chest;Front perineal area;Buttocks;Right lower leg;Left lower leg;Back  Bathing  assist Assist Level: 2 helpers       Upper Body Dressing/Undressing Upper body dressing   What is the patient wearing?: Pull over shirt/dress       Pull over shirt/dress - Perfomed by helper: Thread/unthread right sleeve;Thread/unthread left sleeve;Put head through opening;Pull shirt over trunk        Upper body assist         Lower Body Dressing/Undressing Lower body dressing   What is the patient wearing?: Non-skid slipper socks;Pants       Pants- Performed by helper: Thread/unthread right pants leg;Thread/unthread left pants leg;Pull pants up/down   Non-skid slipper socks- Performed by helper: Don/doff right sock;Don/doff left sock                  Lower body assist  Assist Level: 2 Helpers       Toileting Toileting          Toileting assist      Bed Mobility Roll left and right activity      Sit to lying activity      Lying to sitting activity   Assist level: Moderate assist (Pt 50 - 74%, lift 2 legs)  Mobility details Bed mobility details: Tactile cues for initiation;Tactile cues for weight shifting;Visual cues/gestures for sequencing   Transfers Sit to stand transfer   Sit to stand assist level: Maximal assist (Pt 25 - 49%/lift and lower)    Chair/bed transfer   Chair/bed transfer method: Stand pivot Chair/bed transfer assist level: Maximal assist (Pt 25 - 49%/lift and lower)     Chair/bed transfer details: Visual cues/gestures for sequencing;Manual facilitation for weight shifting;Manual facilitation for weight bearing;Manual facilitation for placement  Toilet transfer                Tub/shower transfer             Cognition Comprehension Comprehension assist level: Understands basic less than 25% of the time/ requires cueing >75% of the time  Expression Expression assist level: Expresses basis less than 25% of the time/requires cueing >75% of the time.  Social Interaction Social Interaction assist level: Interacts appropriately less than 25% of the time. May be withdrawn or combative.  Problem Solving Problem solving assist level: Solves basic less than 25% of the time - needs direction nearly all the time or does not effectively solve problems and may need a restraint for safety  Memory Memory assist level: Recognizes or recalls less than 25% of the time/requires cueing greater than 75% of the time    Therapy/Group: Individual Therapy  Rosalio Loud 11/30/2014, 11:24 AM

## 2014-11-30 NOTE — Progress Notes (Signed)
Speech Language Pathology Daily Session Note  Patient Details  Name: [name] MRN: [id number] Date of Birth: February 19, 1956  Today's Date: 11/30/2014 SLP Individual Time: 1105-1203 SLP Individual Time Calculation (min): 58 min  Short Term Goals: Week 1: SLP Short Term Goal 1 (Week 1): Pt will improve management of her secretions as evidenced by ability to orally expectorate secretions and anteriorly contain her saliva with max assist multimodal cues.   SLP Short Term Goal 2 (Week 1): Pt will consume PO trials with max assist multimodal cues to clear boluses from the oral cavity and to initiate swallow in a timely manner over 3 consecutive sessions prior to completion of objective swallow study.  SLP Short Term Goal 3 (Week 1): Pt will initiate a basic,familiar task with max assist multimodal cues over 50% of observable opportunities.  SLP Short Term Goal 4 (Week 1): Pt will visually scan to midline in >25% of observable opportunities during basic, functional tasks with max assist multimodal cues.  SLP Short Term Goal 5 (Week 1): Pt will vocalize to produce vowels in isolation with max assist multimodal cues.    Skilled Therapeutic Interventions:  Pt was seen for skilled ST targeting goals for dysphagia and communication.  Upon arrival, pt was seated upright in wheelchair, awake, flat affect, agreeable to participate in ST.  SLP facilitated the session with trials of ice chips following thorough oral care.  Pt was able to accept oral care with max assist multimodal cues for opening mouth on command.  Pt consumed three trials of ice chips and demonstrated immediate, wet, congested cough following 2 out of 3 attempts.  Pt does not appear safe for further PO intake at this time; recommend continued NPO.  During structured tasks, pt was able to receptively identify objects form a field of 2 when named with >80% accuracy with max faded to min assist multimodal cues for initiation, which is a significant  improvement from decreased initiation noted on initial evaluation.  Furthermore, pt was able to match objects to written word with ~75% accuracy with max faded to mod assist multimodal cues for error awareness.  Pt was left upright in wheelchair with daughter present at bedside.  Pt returned demonstration of use of call bell with max assist demonstration cues.  Continue per current plan of care.    Function:  Eating Eating     Eating Assist Level: Hand over hand assist;Help managing cup/glass;Helper brings food to mouth (with trials )       Helper Brings Food to Mouth: Every scoop   Cognition Comprehension Comprehension assist level: Understands basic 25 - 49% of the time/ requires cueing 50 - 75% of the time  Expression   Expression assist level: Expresses basis less than 25% of the time/requires cueing >75% of the time.  Social Interaction Social Interaction assist level: Interacts appropriately 25 - 49% of time - Needs frequent redirection.  Problem Solving Problem solving assist level: Solves basic less than 25% of the time - needs direction nearly all the time or does not effectively solve problems and may need a restraint for safety  Memory Memory assist level: Recognizes or recalls less than 25% of the time/requires cueing greater than 75% of the time    Pain Pain Assessment Pain Assessment: Faces Faces Pain Scale: No hurt  Therapy/Group: Individual Therapy  Treyvonne Tata, Melanee Spry 11/30/2014, 12:27 PM

## 2014-12-01 ENCOUNTER — Inpatient Hospital Stay (HOSPITAL_COMMUNITY): Payer: Medicaid Other | Admitting: Speech Pathology

## 2014-12-01 ENCOUNTER — Encounter: Payer: Medicaid Other | Admitting: Vascular Surgery

## 2014-12-01 ENCOUNTER — Inpatient Hospital Stay (HOSPITAL_COMMUNITY): Payer: Medicaid Other | Admitting: Occupational Therapy

## 2014-12-01 ENCOUNTER — Inpatient Hospital Stay (HOSPITAL_COMMUNITY): Payer: Medicaid Other | Admitting: Physical Therapy

## 2014-12-01 LAB — GLUCOSE, CAPILLARY
GLUCOSE-CAPILLARY: 112 mg/dL — AB (ref 65–99)
GLUCOSE-CAPILLARY: 114 mg/dL — AB (ref 65–99)
Glucose-Capillary: 95 mg/dL (ref 65–99)

## 2014-12-01 LAB — POTASSIUM: Potassium: 4.2 mmol/L (ref 3.5–5.1)

## 2014-12-01 MED ORDER — FREE WATER
50.0000 mL | Freq: Two times a day (BID) | Status: DC
  Administered 2014-12-01 – 2014-12-09 (×15): 50 mL

## 2014-12-01 NOTE — Progress Notes (Signed)
Called Riley Lam back about pt Potassium 4.2, previous Potassium 4.0 taken 11/28/14. Will continue to monitor. Foye Clock [facility]

## 2014-12-01 NOTE — Progress Notes (Signed)
Called Dialysis to check to see when the pt were going to receive dialysis tonight. Dialysis nurse Angelo stated the he had two other patients that had to get dialysis. That patient will have dialysis in the morning. I informed him that in the morning pt had sch. Rehab in the morning and that the pt will be off her regular sch for dialysis which is T,Th,Sat.. Dialysis Rn still stated that pt will receive in the morning. Will call MD on call to inform them of the dialysis status. Pattye Meda, RN 

## 2014-12-01 NOTE — Progress Notes (Signed)
Speech Language Pathology Daily Session Note  Patient Details  Name: [name] MRN: [id number] Date of Birth: 27-Jan-1956  Today's Date: 12/01/2014 SLP Individual Time: 0800-0900 SLP Individual Time Calculation (min): 60 min  Short Term Goals: Week 1: SLP Short Term Goal 1 (Week 1): Pt will improve management of her secretions as evidenced by ability to orally expectorate secretions and anteriorly contain her saliva with max assist multimodal cues.   SLP Short Term Goal 2 (Week 1): Pt will consume PO trials with max assist multimodal cues to clear boluses from the oral cavity and to initiate swallow in a timely manner over 3 consecutive sessions prior to completion of objective swallow study.  SLP Short Term Goal 3 (Week 1): Pt will initiate a basic,familiar task with max assist multimodal cues over 50% of observable opportunities.  SLP Short Term Goal 4 (Week 1): Pt will visually scan to midline in >25% of observable opportunities during basic, functional tasks with max assist multimodal cues.  SLP Short Term Goal 5 (Week 1): Pt will vocalize to produce vowels in isolation with max assist multimodal cues.    Skilled Therapeutic Interventions:  Pt was seen for skilled ST targeting cognitive goals.  Upon arrival, pt was partially reclined in bed, awake, flat affect, but indicated agreement to participate in ST via head nod.  Pt was noted with what appeared to be increased work of breathing as evidenced by "panting" breathing pattern.  Vital signs were taken and BP, O2 sats, and pulse were all within normal limits.  Suspect changes in breathing were related to pt's positioning as it appeared to normalize once pt was seated upright in wheelchair.  Pt with audible respirations which pt was unable to clear with cuing for a volitional cough; therefore, PO trials were held today for pt's safety.   Pt was incontinent of bowel, which pt was unable to convey to SLP despite cues and extra time for answering  yes/no questions.  Pt followed 1 step commands to assist in donning a clean brief and shorts with max assist multimodal cues.  Once transferred to wheelchair, pt completed oral care with hand over hand assist for finger occlusion of suction toothette and initiation.   When sitting in front of a mirror, pt was able to complete a basic self care task with max faded to mod assist multimodal cues for initiation.  Pt was able to focus attention to task for ~15 second intervals with max assist verbal and visual cues for redirection.  Pt was left upright in wheelchair with quick release belt donned and call bell within reach.  Pt required hand over hand assist to return demonstration of use of call bell.  Continue per current plan of care.    Function:  Eating Eating                 Cognition Comprehension Comprehension assist level: Understands basic 25 - 49% of the time/ requires cueing 50 - 75% of the time  Expression   Expression assist level: Expresses basis less than 25% of the time/requires cueing >75% of the time.  Social Interaction Social Interaction assist level: Interacts appropriately 25 - 49% of time - Needs frequent redirection.  Problem Solving Problem solving assist level: Solves basic less than 25% of the time - needs direction nearly all the time or does not effectively solve problems and may need a restraint for safety  Memory Memory assist level: Recognizes or recalls less than 25% of the time/requires  cueing greater than 75% of the time    Pain Pain Assessment Pain Assessment: Faces Faces Pain Scale: No hurt  Therapy/Group: Individual Therapy  Quintavious Rinck, Melanee Spry 12/01/2014, 12:29 PM

## 2014-12-01 NOTE — Progress Notes (Signed)
Nutrition Follow-up  DOCUMENTATION CODES:   Not applicable  INTERVENTION:   Continue Nepro formula at goal rate of 60 ml/hr x 20 hours (can hold for 4 hours for therapy) via J-tube to provide 2160 kcal, 97 grams of protein, and 876 ml of free water.   Provide free water flushes of 50 ml BID.  RD to continue to monitor.  NUTRITION DIAGNOSIS:   Inadequate oral intake related to inability to eat as evidenced by NPO status; ongoing  GOAL:   Patient will meet greater than or equal to 90% of their needs; met  MONITOR:   TF tolerance, Weight trends, Labs, I & O's, Skin  REASON FOR ASSESSMENT:   Consult Enteral/tube feeding initiation and management  ASSESSMENT:   59 y.o. 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  Presented 11/10/2014 with right-sided weakness and left gaze deviation. MRI of the brain showed moderate size left MCA territory infarct affecting the left insula and operculum.  Pt aphasic. Pt has been tolerating her tube feeds well at goal rate. RD to continue with current orders. Nutrition-Focused physical exam completed. Pt with no observed significant fat or muscle mass loss.  Diet Order:  Diet NPO time specified  Skin:   (Generalized edema)  Last BM:  8/29  Height:   Ht Readings from Last 1 Encounters:  11/11/14 5' 7.01" (1.702 m)    Weight:   Wt Readings from Last 1 Encounters:  12/01/14 180 lb 12.4 oz (82 kg)    Ideal Body Weight:  61 kg  BMI:  Body mass index is 28.31 kg/(m^2).  Estimated Nutritional Needs:   Kcal:  2000-2200  Protein:  95-115 grams  Fluid:  Per MD  EDUCATION NEEDS:   No education needs identified at this time  Corrin Parker, MS, RD, LDN Pager # [phone] After hours/ weekend pager #  [phone]

## 2014-12-01 NOTE — Progress Notes (Signed)
Cheryll Dessert, NP the MD on call about the pt not going to dialysis tonight. Riley Lam ordered  a stat potassium level since lab was done 11/28/14. Will call back with results.

## 2014-12-01 NOTE — Progress Notes (Signed)
Physical Therapy Session Note  Patient Details  Name: [name] MRN: [id number] Date of Birth: December 06, 1955  Today's Date: 12/01/2014 PT Individual Time: 1300-1355 PT Individual Time Calculation (min): 55 min   Short Term Goals: Week 1:  PT Short Term Goal 1 (Week 1): Will perform bed mobility with modA and use of bedrails PT Short Term Goal 2 (Week 1): Will perform supine <>sit maxA +1 and mod cues PT Short Term Goal 3 (Week 1): Will perform stand pivot transfer modA PT Short Term Goal 4 (Week 1): Will perform ambulation x25' with LRAD and maxA +1 PT Short Term Goal 5 (Week 1): Will perform w/c propulsion x50' with modA  Skilled Therapeutic Interventions/Progress Updates:    Pt received seated in w/c, indicators of pain as described below however pt not shaking head to yes/no questions as in previous session. Gait training with "three muskateers" gait, BUEs over therapist and rehab aide's shoulders. Performed x100' with two turns, no assistance needed for LE progression or stability, however noted slow speed, decreased RLE step length and stance time likely due to strength deficits, narrow BOS and mild scissoring worse during turns. Pt refuses further attempts at ambulation. Sit >stand at parallel bars with use of BUEs on bar to assist with forward trunk lean and as physical assistance for weak LEs. Upon reaching standing, noted pt had incontinent bowel movement. Returned pt to room in w/c with totalA. Sit >stand x2 trials at sink with UE support maxA. Two helpers required to change brief and don/doff pants. Seated rest break after removal of brief and perineal hygiene due to fatigue. Pt returned to standing to complete dressing. Pt remained seated in w/c at completion of session, QR belt intact, soft bell in reach and NA present to take vitals.   Therapy Documentation Precautions:  Precautions Precautions: Fall Precaution Comments: fall, NPO, pusher to the right at times, non-verbal,   Restrictions Weight Bearing Restrictions: No Pain: Pain Assessment Pain Assessment: Faces Faces Pain Scale: No hurt PAINAD (Pain Assessment in Advanced Dementia) Breathing: occasional labored breathing, short period of hyperventilation Negative Vocalization: none Facial Expression: sad, frightened, frown Body Language: tense, distressed pacing, fidgeting Consolability: distracted or reassured by voice/touch PAINAD Score: 4  Function:  Toileting Toileting Toileting activity did not occur: No continent bowel/bladder event   Toileting steps completed by helper: Adjust clothing prior to toileting;Adjust clothing after toileting;Performs perineal hygiene Toileting Assistive Devices: Grab bar or rail Assist level: Two helpers    Transfers Sit to stand transfer   Sit to stand assist level: Maximal assist (Pt 25 - 49%/lift and lower)    Chair/bed transfer               Scientist, physiological device: Other (comment) (three-muskateers) Max distance: 100 Assist level: 2 helpers  Walk 10 feet activity   Assist level: 2 helpers  Walk 50 feet with 2 turns activity   Assist level: 2 helpers  Walk 150 feet activity      Walk 10 feet on uneven surfaces activity      Stairs          Walk up/down 1 step activity        Walk up/down 4 steps activity      Walk up/down 12 steps activity      Pick up  small objects from floor      Wheelchair          Wheel 50 feet with 2 turns activity      Wheel 150 feet activity       Therapy/Group: Individual Therapy  [name] 12/01/2014, 3:15 PM

## 2014-12-01 NOTE — Progress Notes (Signed)
Occupational Therapy Session Note  Patient Details  Name: [name] MRN: [id number] Date of Birth: 1955-08-17  Today's Date: 12/01/2014 OT Individual Time: 0930-1100 OT Individual Time Calculation (min): 90 min    Short Term Goals: Week 1:  OT Short Term Goal 1 (Week 1): Pt will maintain static sittting balance for 10 mins with no more than mod assist in preparation for selfcare tasks.   OT Short Term Goal 2 (Week 1): Pt will complete 2 grooming tasks with no more than mod demonstrational cueing for initiation and completion. OT Short Term Goal 3 (Week 1): Pt will perform UB bathing with mod assist sitting supported. OT Short Term Goal 4 (Week 1): Pt will use the RUE for washing face or UB with no more than mod assist. OT Short Term Goal 5 (Week 1): Pt will perform toilet transfer stand pivot with no more than mod assist.   Skilled Therapeutic Interventions/Progress Updates:    ADL retraining with focus on initiation, functional use of RUE, Rt attention, and transfers.  UB bathing and dressing completed at sink with focus on increased initiation, decision making, and participation in self-care tasks.  Improved interaction with therapist with increased wait time for pt to decide between two shirts, to wear bra or not, and ask for assistance with dressing.  Pt shook head no to wearing bra after increased time, also did shake head yes to needing assistance with donning shirt.  Hand over hand to assist with pulling shirt over head.  Engaged in seated task at edge of mat with focus on initiation, scanning to Lt and Rt, as well as use of BUE in table top task.  Increased wait time to follow one step directions, requiring demonstration cues and hand over hand for initiation with large pegs.  Pt able to remove pegs more quickly and without additional cues than placing pegs.  Utilized Editor, commissioning with small variety to encourage scanning to identify letters to spell first name.  Pt required increased  time, able to locate 2 of 4 letters independently.  Pt with increased attempts to utilize LUE sporadically during self-care and table top tasks.  Pt required +2 with squat pivot transfer to Lt back to w/c.  Therapy Documentation Precautions:  Precautions Precautions: Fall Precaution Comments: fall, NPO, pusher to the right at times, non-verbal,  Restrictions Weight Bearing Restrictions: No General:   Vital Signs: Therapy Vitals Pulse Rate: (!) 102 BP: 133/77 mmHg Patient Position (if appropriate): Lying Oxygen Therapy SpO2: 93 % O2 Device: Nasal Cannula O2 Flow Rate (L/min): 2 L/min Pain:  Pt with no c/o pain  Function:   Grooming Oral Care,Brush Teeth, Clean Dentures Activity:             Wash, Rinse, Dry Face Activity   Assist Level: Supervision or verbal cues;Set up;Touching or steadying assistance(Pt > 75%) (hand over hand to initiate bringing cloth to face, pt completed task from there)   Set up : To obtain items;To open containers  Wash, Rinse, Dry Hands Activity   Assist Level: Helper performed activity      Brush, Comb Hair Activity   Assist Level: Helper performed activity    Shave Activity          Apply Makeup Activity  Bathing Bathing position   Position: Wheelchair/chair at sink  Bathing parts   Body parts bathed by helper: Right arm;Left arm;Chest;Buttocks (completed UB bathing only)  Bathing assist Assist Level: 2 helpers       Upper Body Dressing/Undressing Upper body dressing   What is the patient wearing?: Pull over shirt/dress       Pull over shirt/dress - Perfomed by helper: Thread/unthread right sleeve;Thread/unthread left sleeve;Put head through opening;Pull shirt over trunk        Upper body assist           Cognition Comprehension Comprehension assist level: Understands basic 25 - 49% of the time/ requires cueing 50 - 75% of the time  Expression Expression  assist level: Expresses basis less than 25% of the time/requires cueing >75% of the time.  Social Interaction Social Interaction assist level: Interacts appropriately 25 - 49% of time - Needs frequent redirection.  Problem Solving Problem solving assist level: Solves basic less than 25% of the time - needs direction nearly all the time or does not effectively solve problems and may need a restraint for safety  Memory Memory assist level: Recognizes or recalls less than 25% of the time/requires cueing greater than 75% of the time    Therapy/Group: Individual Therapy  Willard Madrigal 12/01/2014, 11:18 AM

## 2014-12-01 NOTE — Plan of Care (Signed)
Problem: RH PAIN MANAGEMENT Goal: RH STG PAIN MANAGED AT OR BELOW PT'S PAIN GOAL Less than 3,on scale 1 to 10.  Outcome: Not Progressing Tylenol given for pain

## 2014-12-01 NOTE — Progress Notes (Signed)
Subjective/Complaints: Pt working on initiation with SLP ROS cannot obtain due to global aphasia   Objective: Vital Signs: Blood pressure 133/77, pulse 102, temperature 98.8 F (37.1 C), temperature source Oral, resp. rate 19, weight 82 kg (180 lb 12.4 oz), SpO2 93 %. No results found. Results for orders placed or performed during the hospital encounter of 11/25/14 (from the past 72 hour(s))  Glucose, capillary     Status: None   Collection Time: 11/28/14 12:01 PM  Result Value Ref Range   Glucose-Capillary 77 65 - 99 mg/dL   Comment 1 Notify RN   Renal function panel     Status: Abnormal   Collection Time: 11/28/14  6:51 PM  Result Value Ref Range   Sodium 136 135 - 145 mmol/L   Potassium 4.0 3.5 - 5.1 mmol/L   Chloride 97 (L) 101 - 111 mmol/L   CO2 28 22 - 32 mmol/L   Glucose, Bld 115 (H) 65 - 99 mg/dL   BUN 51 (H) 6 - 20 mg/dL   Creatinine, Ser 6.91 (H) 0.44 - 1.00 mg/dL   Calcium 8.9 8.9 - 91.2 mg/dL   Phosphorus 3.7 2.5 - 4.6 mg/dL   Albumin 2.4 (L) 3.5 - 5.0 g/dL   GFR calc non Af Amer 7 (L) >60 mL/min   GFR calc Af Amer 8 (L) >60 mL/min    Comment: (NOTE) The eGFR has been calculated using the CKD EPI equation. This calculation has not been validated in all clinical situations. eGFR's persistently <60 mL/min signify possible Chronic Kidney Disease.    Anion gap 11 5 - 15  CBC     Status: Abnormal   Collection Time: 11/28/14  6:51 PM  Result Value Ref Range   WBC 8.2 4.0 - 10.5 K/uL   RBC 3.71 (L) 3.87 - 5.11 MIL/uL   Hemoglobin 10.2 (L) 12.0 - 15.0 g/dL   HCT 44.3 (L) 90.9 - 83.1 %   MCV 88.7 78.0 - 100.0 fL   MCH 27.5 26.0 - 34.0 pg   MCHC 31.0 30.0 - 36.0 g/dL   RDW 80.1 (H) 52.7 - 07.0 %   Platelets 341 150 - 400 K/uL  Glucose, capillary     Status: None   Collection Time: 11/28/14 11:33 PM  Result Value Ref Range   Glucose-Capillary 99 65 - 99 mg/dL  Glucose, capillary     Status: Abnormal   Collection Time: 11/29/14  5:56 AM  Result Value Ref  Range   Glucose-Capillary 104 (H) 65 - 99 mg/dL  Glucose, capillary     Status: Abnormal   Collection Time: 11/29/14 12:02 PM  Result Value Ref Range   Glucose-Capillary 107 (H) 65 - 99 mg/dL   Comment 1 Notify RN   Glucose, capillary     Status: None   Collection Time: 11/29/14  5:58 PM  Result Value Ref Range   Glucose-Capillary 92 65 - 99 mg/dL   Comment 1 Notify RN   Glucose, capillary     Status: Abnormal   Collection Time: 11/30/14 12:00 AM  Result Value Ref Range   Glucose-Capillary 100 (H) 65 - 99 mg/dL  Glucose, capillary     Status: Abnormal   Collection Time: 11/30/14  6:28 AM  Result Value Ref Range   Glucose-Capillary 105 (H) 65 - 99 mg/dL  Glucose, capillary     Status: None   Collection Time: 11/30/14  3:05 PM  Result Value Ref Range   Glucose-Capillary 95 65 - 99 mg/dL  Glucose, capillary     Status: None   Collection Time: 11/30/14  9:11 PM  Result Value Ref Range   Glucose-Capillary 97 65 - 99 mg/dL  Glucose, capillary     Status: Abnormal   Collection Time: 12/01/14  5:54 AM  Result Value Ref Range   Glucose-Capillary 112 (H) 65 - 99 mg/dL     HEENT: poor oral movements  facial diplegia, cannot open mouth to command Cardio: RRR and no murmur Resp: CTA B/L.no use of accesory muscles, exp wheezes but still with slight tachypnea ~19 this am GI: BS positive and NT, ND, pior miline abd incision with extensive scarring Extremity:  Pulses positive and No Edema Skin:   Other G tube site NT Neuro: Flat but very alert, Cranial Nerve Abnormalities bilateral central 7, Abnormal Sensory some withdrawl to pinch in all 4 ext, Abnormal Motor 0/5 in RUE, 2- LUE, trace R and left knee ext, Abnormal FMC Tone  increased ext tone in BLE, increased flexor tone in RUE, Aphasic and Apraxic Musc/Skel:  Other decrease ROM in BIlateral knees and Right shoulder and elbow due to tone Gen NAD   Assessment/Plan: 1. Functional deficits secondary to acute Left MCA infarct superimposed  on subacute R ACA infarct with bilateral hemiparesis, severe dysphagia and severe aphasiawhich require 3+ hours per day of interdisciplinary therapy in a comprehensive inpatient rehab setting. Physiatrist is providing close team supervision and 24 hour management of active medical problems listed below. Physiatrist and rehab team continue to assess barriers to discharge/monitor patient progress toward functional and medical goals. FIM: Function - Bathing Position: Wheelchair/chair at sink Body parts bathed by patient: Right upper leg, Left upper leg Body parts bathed by helper: Right arm, Left arm, Chest, Front perineal area, Buttocks, Right lower leg, Left lower leg, Back Bathing not applicable: Abdomen Assist Level: 2 helpers  Function- Upper Body Dressing/Undressing What is the patient wearing?: Pull over shirt/dress Pull over shirt/dress - Perfomed by helper: Thread/unthread right sleeve, Thread/unthread left sleeve, Put head through opening, Pull shirt over trunk Function - Lower Body Dressing/Undressing What is the patient wearing?: Non-skid slipper socks, Pants Position: Wheelchair/chair at sink Pants- Performed by helper: Thread/unthread right pants leg, Thread/unthread left pants leg, Pull pants up/down Non-skid slipper socks- Performed by helper: Don/doff right sock, Don/doff left sock Assist Level: 2 Helpers  Function - Toileting Toileting activity did not occur: No continent bowel/bladder event Toileting steps completed by helper: Adjust clothing prior to toileting, Performs perineal hygiene, Adjust clothing after toileting Assist level: Two helpers  Function - Air cabin crew transfer activity did not occur: Safety/medical concerns  Function - Chair/bed transfer Chair/bed transfer method: Stand pivot Chair/bed transfer assist level: Maximal assist (Pt 25 - 49%/lift and lower) Chair/bed transfer details: Visual cues/gestures for sequencing, Manual facilitation for  weight shifting, Manual facilitation for weight bearing, Manual facilitation for placement  Function - Locomotion: Wheelchair Will patient use wheelchair at discharge?: Yes Wheelchair activity did not occur:  (Attempted hand-over-hand facilitation for w/c propulsion, visual demonstration, however no pt initiation evident) Assist Level: Dependent (Pt equals 0%) Function - Locomotion: Ambulation Ambulation activity did not occur: Safety/medical concerns (Attempted ambulation, however upon standing x3 reps pt unable to step (unable to determine if due to$Remove' \\[id number]$  or pt fearful)) Assistive device: Parallel bars Max distance: 10 Assist level: 2 helpers Walk 10 feet activity did not occur: Safety/medical concerns Assist level: 2 helpers Walk 50 feet with 2 turns activity did not occur: Safety/medical concerns Walk 150 feet activity  did not occur: Safety/medical concerns Walk 10 feet on uneven surfaces activity did not occur: Safety/medical concerns  Function - Comprehension Comprehension: Auditory Comprehension assist level: Understands basic 25 - 49% of the time/ requires cueing 50 - 75% of the time  Function - Expression Expression: Nonverbal Expression assist level: Expresses basis less than 25% of the time/requires cueing >75% of the time.  Function - Social Interaction Social Interaction assist level: Interacts appropriately 25 - 49% of time - Needs frequent redirection.  Function - Problem Solving Problem solving assist level: Solves basic less than 25% of the time - needs direction nearly all the time or does not effectively solve problems and may need a restraint for safety  Function - Memory Memory assist level: Recognizes or recalls less than 25% of the time/requires cueing greater than 75% of the time Patient normally able to recall (first 3 days only): None of the above  Medical Problem List and Plan: 1. Functional deficits secondary to left MCA infarct/respiratory failure.  Extubated 11/20/2014. RR tends to be elevated but sats ok, wheezing will schedule nebs 2. DVT Prophylaxis/Anticoagulation: SCDs. Monitor for any signs of DVT. 8/12 venous dopplers neg 3. Pain Management: Tylenol as needed, no apparent pain during PT 4. Dysphagia. Patient is nothing by mouth. Status post gastrostomy tube 08/12/2014 per interventional radiology for nutritional support. poor prognosis for improvement given severe bilateral deficits, do not anticipate d/c PEG during this admission 5. Neuropsych: This patient is not capable of making decisions on her own behalf. 6. Skin/Wound Care: Routine skin checks. PEG site maintenance. continue turning and nutrition 7. Fluids/Electrolytes/Nutrition: Routine I&O. Adjust TF based on nutritional needs. Renal adjusting dialysis, complicated situation 8. End-stage renal disease with hemodialysis. Follow-up per renal services. Hemodialysis in PM after therapies to avoid conflict 9. Seizure prophylaxis. Keppra 500 mg twice a day, monitor drug toxicity. No seizures 10. Hypothyroidism. Synthroid 11. Chronic anemia. Aranesp weekly with dialysis, hgb 10.2 on 8/27 12. History of asthma. Schedule nebulizers given aphasia. Anxiety component also.  13. Mood/anxiety. Continue Xanax on dialysis days 14.  Hx of recurrent resp failure, high risk for PNA given comorbid conditions,  RR increases at times. Comfortable. Secretions better. Afebrile. No distress.  -continue to aggressively monitor, aspiration precautions,  -wean oxygen down to keep sats >90% 15.  Bowel incont- monitor skin 16.  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

## 2014-12-01 NOTE — Progress Notes (Signed)
[facility] ROUNDING NOTE   Subjective:   Interval History: no compaints  Objective:  Vital signs in last 24 hours:  Temp:  [98.4 F (36.9 C)-98.8 F (37.1 C)] 98.4 F (36.9 C) (08/30 1356) Pulse Rate:  [95-102] 101 (08/30 1356) Resp:  [18-19] 18 (08/30 1356) BP: (126-146)/(68-93) 146/93 mmHg (08/30 1356) SpO2:  [93 %-100 %] 97 % (08/30 1413) Weight:  [82 kg (180 lb 12.4 oz)] 82 kg (180 lb 12.4 oz) (08/30 0551)  Weight change: 0.7 kg (1 lb 8.7 oz) Filed Weights   11/29/14 0508 11/30/14 0300 12/01/14 0551  Weight: 81.2 kg (179 lb 0.2 oz) 81.3 kg (179 lb 3.7 oz) 82 kg (180 lb 12.4 oz)    Intake/Output: I/O last 3 completed shifts: In: 600 [NG/GT:600] Out: -    Intake/Output this shift:     CVS- RRR RS- CTA ABD- BS present soft non-distended EXT- no edema   Basic Metabolic Panel:  Recent Labs Lab 11/27/14 0025 11/28/14 1851  NA 141 136  K 3.8 4.0  CL 99* 97*  CO2 27 28  GLUCOSE 75 115*  BUN 79* 51*  CREATININE 7.47* 6.29*  CALCIUM 8.8* 8.9  PHOS 4.0 3.7    Liver Function Tests:  Recent Labs Lab 11/27/14 0025 11/28/14 1851  ALBUMIN 2.4* 2.4*   No results for input(s): LIPASE, AMYLASE in the last 168 hours. No results for input(s): AMMONIA in the last 168 hours.  CBC:  Recent Labs Lab 11/27/14 0024 11/27/14 1008 11/28/14 1851  WBC 7.6 6.8 8.2  NEUTROABS  --  4.3  --   HGB 9.1* 11.3* 10.2*  HCT 28.5* 35.7* 32.9*  MCV 86.1 88.6 88.7  PLT 326 301 341    Cardiac Enzymes: No results for input(s): CKTOTAL, CKMB, CKMBINDEX, TROPONINI in the last 168 hours.  BNP: Invalid input(s): POCBNP  CBG:  Recent Labs Lab 11/30/14 1505 11/30/14 2111 12/01/14 0554 12/01/14 1123 12/01/14 1748  GLUCAP 95 97 112* 114* 95    Microbiology: Results for orders placed or performed during the hospital encounter of 11/10/14  MRSA PCR Screening     Status: None   Collection Time: 11/10/14  3:00 PM  Result Value Ref Range Status   MRSA  by PCR NEGATIVE NEGATIVE Final    Comment:        The GeneXpert MRSA Assay (FDA approved for NASAL specimens only), is one component of a comprehensive MRSA colonization surveillance program. It is not intended to diagnose MRSA infection nor to guide or monitor treatment for MRSA infections.     Coagulation Studies: No results for input(s): LABPROT, INR in the last 72 hours.  Urinalysis: No results for input(s): COLORURINE, LABSPEC, PHURINE, GLUCOSEU, HGBUR, BILIRUBINUR, KETONESUR, PROTEINUR, UROBILINOGEN, NITRITE, LEUKOCYTESUR in the last 72 hours.  Invalid input(s): APPERANCEUR    Imaging: No results found.   Medications:   . feeding supplement (NEPRO CARB STEADY) 1,000 mL (12/01/14 0726)   . acetylcysteine  3 mL Nebulization BID  . ALPRAZolam  0.5 mg Per Tube Q T,Th,Sa-HD  . antiseptic oral rinse  7 mL Mouth Rinse QID  . aspirin  325 mg Oral Daily  . budesonide  0.25 mg Nebulization BID  . chlorhexidine  15 mL Mouth/Throat BID  . darbepoetin (ARANESP) injection - DIALYSIS  100 mcg Intravenous Q Tue-HD  . free water  50 mL Per Tube BID  . guaifenesin  200 mg Per Tube TID  . ipratropium-albuterol  3 mL Nebulization Q6H  . lanthanum  500 mg Per Tube TID  . levETIRAcetam  500 mg Oral BID  . levothyroxine  25 mcg Oral QAC breakfast  . multivitamin  1 tablet Oral QHS  . pantoprazole sodium  40 mg Per Tube Q1200   acetaminophen, ALPRAZolam, atropine, ipratropium-albuterol, ondansetron **OR** ondansetron (ZOFRAN) IV, sorbitol  Assessment/ Plan:   ESRD- TTS dialysis  ANEMIA- stable Hb  MBD  Vitamin D and binders  HTN/VOL-controlled     LOS: 6 Daly Whipkey W  :25 PM

## 2014-12-02 ENCOUNTER — Inpatient Hospital Stay (HOSPITAL_COMMUNITY): Payer: Medicaid Other | Admitting: Occupational Therapy

## 2014-12-02 ENCOUNTER — Inpatient Hospital Stay (HOSPITAL_COMMUNITY): Payer: Medicaid Other | Admitting: Speech Pathology

## 2014-12-02 ENCOUNTER — Inpatient Hospital Stay (HOSPITAL_COMMUNITY): Payer: Medicaid Other | Admitting: Physical Therapy

## 2014-12-02 LAB — CBC
HEMATOCRIT: 30.6 % — AB (ref 36.0–46.0)
HEMOGLOBIN: 9.6 g/dL — AB (ref 12.0–15.0)
MCH: 26.7 pg (ref 26.0–34.0)
MCHC: 31.4 g/dL (ref 30.0–36.0)
MCV: 85.2 fL (ref 78.0–100.0)
Platelets: 324 10*3/uL (ref 150–400)
RBC: 3.59 MIL/uL — ABNORMAL LOW (ref 3.87–5.11)
RDW: 18.4 % — ABNORMAL HIGH (ref 11.5–15.5)
WBC: 9.2 10*3/uL (ref 4.0–10.5)

## 2014-12-02 LAB — RENAL FUNCTION PANEL
ANION GAP: 15 (ref 5–15)
Albumin: 2.6 g/dL — ABNORMAL LOW (ref 3.5–5.0)
BUN: 76 mg/dL — ABNORMAL HIGH (ref 6–20)
CO2: 27 mmol/L (ref 22–32)
Calcium: 9.4 mg/dL (ref 8.9–10.3)
Chloride: 92 mmol/L — ABNORMAL LOW (ref 101–111)
Creatinine, Ser: 7.92 mg/dL — ABNORMAL HIGH (ref 0.44–1.00)
GFR calc Af Amer: 6 mL/min — ABNORMAL LOW (ref >60)
GFR calc non Af Amer: 5 mL/min — ABNORMAL LOW (ref >60)
GLUCOSE: 80 mg/dL (ref 65–99)
POTASSIUM: 4.7 mmol/L (ref 3.5–5.1)
Phosphorus: 4.4 mg/dL (ref 2.5–4.6)
Sodium: 134 mmol/L — ABNORMAL LOW (ref 135–145)

## 2014-12-02 LAB — GLUCOSE, CAPILLARY
GLUCOSE-CAPILLARY: 137 mg/dL — AB (ref 65–99)
GLUCOSE-CAPILLARY: 86 mg/dL (ref 65–99)
GLUCOSE-CAPILLARY: 94 mg/dL (ref 65–99)
Glucose-Capillary: 105 mg/dL — ABNORMAL HIGH (ref 65–99)
Glucose-Capillary: 113 mg/dL — ABNORMAL HIGH (ref 65–99)

## 2014-12-02 MED ORDER — SODIUM CHLORIDE 0.9 % IV SOLN: 100.0000 mL | INTRAVENOUS | Status: DC | PRN

## 2014-12-02 MED ORDER — ACETYLCYSTEINE 20 % IN SOLN
3.0000 mL | Freq: Two times a day (BID) | RESPIRATORY_TRACT | Status: DC | PRN
  Filled 2014-12-02: qty 4

## 2014-12-02 MED ORDER — PENTAFLUOROPROP-TETRAFLUOROETH EX AERO: 1.0000 "application " | INHALATION_SPRAY | CUTANEOUS | Status: DC | PRN

## 2014-12-02 MED ORDER — HEPARIN SODIUM (PORCINE) 1000 UNIT/ML DIALYSIS: 1000.0000 [IU] | INTRAMUSCULAR | Status: DC | PRN

## 2014-12-02 MED ORDER — IPRATROPIUM-ALBUTEROL 0.5-2.5 (3) MG/3ML IN SOLN
3.0000 mL | RESPIRATORY_TRACT | Status: DC | PRN
  Administered 2014-12-05 – 2014-12-24 (×4): 3 mL via RESPIRATORY_TRACT
  Filled 2014-12-02 (×4): qty 3

## 2014-12-02 MED ORDER — NEPRO/CARBSTEADY PO LIQD: 237.0000 mL | ORAL | Status: DC | PRN

## 2014-12-02 MED ORDER — DARBEPOETIN ALFA 100 MCG/0.5ML IJ SOSY
100.0000 ug | PREFILLED_SYRINGE | Freq: Once | INTRAMUSCULAR | Status: AC
Start: 2014-12-02 — End: 2014-12-02
  Administered 2014-12-02: 100 ug via SUBCUTANEOUS
  Filled 2014-12-02: qty 0.5

## 2014-12-02 MED ORDER — ALTEPLASE 2 MG IJ SOLR: 2.0000 mg | Freq: Once | INTRAMUSCULAR | Status: DC | PRN

## 2014-12-02 MED ORDER — MIRTAZAPINE 15 MG PO TABS
7.5000 mg | ORAL_TABLET | Freq: Every day | ORAL | Status: DC
  Administered 2014-12-02 – 2014-12-20 (×19): 7.5 mg
  Filled 2014-12-02 (×19): qty 1

## 2014-12-02 MED ORDER — LIDOCAINE HCL (PF) 1 % IJ SOLN: 5.0000 mL | INTRAMUSCULAR | Status: DC | PRN

## 2014-12-02 MED ORDER — DARBEPOETIN ALFA 100 MCG/0.5ML IJ SOSY
100.0000 ug | PREFILLED_SYRINGE | INTRAMUSCULAR | Status: DC
  Filled 2014-12-02 (×2): qty 0.5

## 2014-12-02 MED ORDER — LIDOCAINE-PRILOCAINE 2.5-2.5 % EX CREA: 1.0000 "application " | TOPICAL_CREAM | CUTANEOUS | Status: DC | PRN

## 2014-12-02 NOTE — Patient Care Conference (Signed)
Inpatient RehabilitationTeam Conference and Plan of Care Update Date: 12/02/2014   Time: 11;15 AM    Patient Name: [name]      Medical Record Number: [id number]  Date of Birth: 1955/06/30 Sex: Female         Room/Bed: 4W22C/4W22C-01 Payor Info: Payor: MEDICAID [place] / Plan: MEDICAID [place] ACCESS / Product Type: *No Product type* /    Admitting Diagnosis: L MCA infarct  old cva  Admit Date/Time:  11/25/2014  4:48 PM Admission Comments: No comment available   Primary Diagnosis:  Left middle cerebral artery stroke Principal Problem: Left middle cerebral artery stroke  Patient Active Problem List   Diagnosis Date Noted  . Left middle cerebral artery stroke 11/25/2014  . Right hemiparesis 11/25/2014  . Aphasia due to stroke 11/25/2014  . Acute respiratory failure   . Cerebral infarction due to embolism of left middle cerebral artery   . Gastrostomy tube in place   . PEG (percutaneous endoscopic gastrostomy) status   . Respiratory distress   . DNAR (do not attempt resuscitation) 11/20/2014  . Paroxysmal a-fib 11/16/2014  . HLD (hyperlipidemia) 11/16/2014  . ESRD on dialysis 11/16/2014  . History of stroke 11/16/2014  . Essential hypertension   . PFO (patent foramen ovale)   . Acute respiratory failure with hypoxemia   . CVA (cerebral infarction) 11/10/2014  . Chronic ischemic colitis 09/25/2014  . Acute right ACA stroke 09/11/2014  . Left hemiparesis 09/11/2014  . History of ETT   . Duodenal fistula   . Sepsis   . Aortic aneurysm 08/13/2014  . Protein-calorie malnutrition   . Itching 08/12/2014  . Duodenal anastomotic leak   . Protein calorie malnutrition   . SVT (supraventricular tachycardia)   . Right heart failure   . ESRD (end stage renal disease)   . Abdominal pain   . Palliative care encounter   . 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 Fistula   . History of intermediate V/Q scan   . Other emphysema   . Intra-abdominal abscess   . Abnormal  TSH   . Enteritis due to Clostridium difficile 07/19/2014  . Abdominal abscess   . Pelvic fluid collection   . Perforated gastric ulcer 07/01/2014  . SOB (shortness of breath)   . PJRT (permanent junctional reciprocating tachycardia)   . Paroxysmal atrial tachycardia   . PSVT (paroxysmal supraventricular tachycardia)   . Stroke   . Hyperlipidemia   . 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 Hypertension 06/20/2014  . ESRD needing dialysis 06/20/2014  . Obesity (BMI 30-39.9) 06/20/2014  . Depression 06/20/2014  . Left renal mass 06/20/2014  . Chronic diastolic heart failure 06/20/2014  . H1N1 influenza 06/20/2014  . Tobacco use disorder 06/20/2014  . ARF (acute renal failure)   . Renal failure (ARF), acute on chronic   . Acute respiratory failure with hypoxia 06/17/2014  . End stage renal disease 01/21/2014    Expected Discharge Date: Expected Discharge Date: 12/19/14  Team Members Present: Physician leading conference: Dr. Claudette Laws Social Worker Present: Dossie Der, LCSW Nurse Present: Chana Bode, RN PT Present: Alyson Reedy, PT OT Present: Roney Mans, Suszanne Conners, OT SLP Present: Jackalyn Lombard, SLP PPS Coordinator present : Tora Duck, RN, CRRN     Current Status/Progress Goal Weekly Team Focus  Medical   Maximum assistance for mobility, nothing by mouth on chronic tube feeds,, severe aphasia  Improve basic communication, reduced mobility needs to one-person assist  All therapies to work on initiation  of movements   Bowel/Bladder   Dialysis no voids, Incont. bowel LBM 8/31  To be continent of bowel      Swallow/Nutrition/ Hydration   NPO   Min assist   toleration of PO trials, management of secretions    ADL's   Total assist with self-care tasks due to impaired initiation and processing.  Max assist sit > stand and +2 for LB hygiene and dressing  min assist overall, mod assist LB dressing  initiation, BUE NMR, sit > stand,  overall participation in treatment session   Mobility   modA bed mobility, transfers, +2 gait and stairs  minA bed mobility, transfers, gait, w/c; modA stairs  initiation of mobility tasks, activity tolerance, LE strength, gait training, participation/motivation in therapy   Communication   globally aphasic, orally apraxic, nonverbal at this time   min-max assist   phonation, yes/no response accuracy, use of call bell to convey needs and wants to staff, multimodal communication.    Safety/Cognition/ Behavioral Observations  severe deficits, poor initiation, right inattention, poor focused attention   Min-Mod for basic   visual scanning to midline, initiation of basic, familiar self care tasks, focused atteniton to basic tasks.     Pain   Non verbal, Faces assessment rates pain 2-6/10  Pain <3  Assess pain q shift, Give prn pain meds   Skin   Excoriations on inner thigs and buttom.EPC apply after each incontinent episode.  To keep skin free of new skin breakdown and infections.  To assess skin Q shift and PRN      *See Care Plan and progress notes for long and short-term goals.  Barriers to Discharge: See above    Possible Resolutions to Barriers:  Continue rehabilitation efforts, may need SNF placement, would reduce burden of care first    Discharge Planning/Teaching Needs:  HOme with son and daughter along with friend who were providing care prior to admission. Pt will require much more care this time than last admission      Team Discussion:  Po trials-PEG feeds were doing prior to admission. Poor initaition and poor attention. Weaning off O2. Trying remeron for sleep and emotional liability. Aphasia severe. Goals min/mod level of assist. Actually walked today in therapies with much assistance. Family aware will be more care than last admission. Chest x-ray clear and breathing issues stabilized  Revisions to Treatment Plan:  None   Continued Need for Acute Rehabilitation Level of  Care: The patient requires daily medical management by a physician with specialized training in physical medicine and rehabilitation for the following conditions: Daily direction of a multidisciplinary physical rehabilitation program to ensure safe treatment while eliciting the highest outcome that is of practical value to the patient.: Yes Daily medical management of patient stability for increased activity during participation in an intensive rehabilitation regime.: Yes Daily analysis of laboratory values and/or radiology reports with any subsequent need for medication adjustment of medical intervention for : Neurological problems;Other  Elani Delph, Lemar Livings 12/02/2014, 2:05 PM

## 2014-12-02 NOTE — Progress Notes (Signed)
Physical Therapy Session Note  Patient Details  Name: [name] MRN: [id number] Date of Birth: Sep 15, 1955  Today's Date: 12/02/2014 PT Individual Time: 0800-0910 PT Individual Time Calculation (min): 70 min   Short Term Goals: Week 1:  PT Short Term Goal 1 (Week 1): Will perform bed mobility with modA and use of bedrails PT Short Term Goal 2 (Week 1): Will perform supine <>sit maxA +1 and mod cues PT Short Term Goal 3 (Week 1): Will perform stand pivot transfer modA PT Short Term Goal 4 (Week 1): Will perform ambulation x25' with LRAD and maxA +1 PT Short Term Goal 5 (Week 1): Will perform w/c propulsion x50' with modA  Skilled Therapeutic Interventions/Progress Updates:    [phone]: Pt received supine in bed; unable to verbalize c/o pain however noted signs/symptoms of pain as described below. Attempted to determine cause of pain or discomfort however unable to determine. Supine >sit with modA and HOB elevated with bedrails. Stand pivot transfer bed>w/c with maxA sit>stand and modA during transfer with facilitation for weight shifting and cues for hand placement. Gait training in hallway with +3 assist and "three muskateers" assistance and A for management of lines/leads; x150' total including two turns. 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 Pt noted to have incontinent bowel movement; returned to room in w/c with totalA. Pt became very emotional, required reassurance and discussed pt progress towards goals. Sit >stand at sink x2 trials with +2 assist for removal and donning of brief, perineal hygiene. Seated rest break between trials due to LE fatigue. Pt remained seated in w/c with handoff to SLP for next session.   1300-1340: Pt received seated in w/c with daughter present; as above, unable to verbalize pain or discomfort but noted signs/symptoms of pain. Pt transported to gym with totalA for energy conservation. Stair training with +2A x4 3-inch stairs. Requires  increased time to perform due to apraxia as well as possible fear; verbal/tactile/visual cues for advancing LEs, facilitation for weight shifting as well as facilitation at R quads to prevent buckling. Also facilitated with moving head to L in midline to assist with posture and weight shifting. Pt demonstrates labored breathing following trial; O2 measured 100% and HR 106. Pt instructed in deep breathing strategies to reduce respiratory rate. Gait training in hallway x100' to return to room with +2 HHA. 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 Pt cued to lie down in bed, however resistant to assistance. Asked pt if she would like to remain seated, and pt shook head yes. Stand pivot maxA+1 to w/c. Remained seated in w/c with QR belt intact, all needs within reach at completion of session. NA alerted to possible incontinent bowel movement.   Therapy Documentation Precautions:  Precautions Precautions: Fall Precaution Comments: fall, NPO, pusher to the right at times, non-verbal,  Restrictions Weight Bearing Restrictions: No Pain: PAINAD (Pain Assessment in Advanced Dementia) Breathing: occasional labored breathing, short period of hyperventilation Negative Vocalization: none Facial Expression: sad, frightened, frown Body Language: tense, distressed pacing, fidgeting Consolability: distracted or reassured by voice/touch PAINAD Score: 4  Function:  Toileting Toileting     Toileting steps completed by helper: Adjust clothing prior to toileting;Adjust clothing after toileting;Performs perineal hygiene Toileting Assistive Devices: Other (comment) (UE support on sink) Assist level: Two helpers   Bed Mobility Roll left and right activity        Sit to lying activity  Lying to sitting activity   Assist level: Touching or steadying assistance (Pt > 75%, lift 1 leg) Lying to sitting assistive device: HOB  elevated;Bedrails  Mobility details Bed mobility details: Tactile cues for initiation;Tactile cues for weight shifting;Visual cues/gestures for sequencing   Transfers Sit to stand transfer   Sit to stand assist level: Maximal assist (Pt 25 - 49%/lift and lower) Sit to stand assistive device: Bedrails  Chair/bed transfer   Chair/bed transfer method: Stand pivot Chair/bed transfer assist level: Maximal assist (Pt 25 - 49%/lift and lower) Chair/bed transfer assistive device: Armrests;Bedrails   Chair/bed transfer details: Visual cues/gestures for sequencing;Manual facilitation for weight shifting;Manual facilitation for weight bearing;Manual facilitation for placement   Toilet transfer   Toilet transfer assistive device: Drop arm commode   Assist level to toilet: 2 helpers Assist level from toilet: 2 helpers      Special educational needs teacher device: Other (comment) (three-muskateers) Max distance: 150 Assist level: 2 helpers  Walk 10 feet activity   Assist level: 2 helpers  Walk 50 feet with 2 turns activity   Assist level: 2 helpers  Walk 150 feet activity   Assist level: 2 helpers  Walk 10 feet on uneven surfaces activity      Stairs   Stairs assistive device: 2 hand rails Max number of stairs: 4 Stairs assist level: 2 helpers  Walk up/down 1 step activity     Walk up/down 1 step (curb) assist level: 2 helpers  Walk up/down 4 steps activity   Walk up/down 4 steps assist level: 2 helpers  Walk up/down 12 steps activity      Pick up small objects from floor      Wheelchair          Wheel 50 feet with 2 turns activity      Wheel 150 feet activity        Therapy/Group: Individual Therapy  [name] 12/02/2014, 4:00 PM

## 2014-12-02 NOTE — Progress Notes (Signed)
[facility] ROUNDING NOTE   Subjective:   Interval History: no complaints slightly more tearful today  Objective:  Vital signs in last 24 hours:  Temp:  [98.1 F (36.7 C)-98.4 F (36.9 C)] 98.1 F (36.7 C) (08/31 0533) Pulse Rate:  [101-102] 102 (08/31 0533) Resp:  [18-19] 19 (08/31 0533) BP: (133-146)/(80-93) 133/80 mmHg (08/31 0533) SpO2:  [97 %-100 %] 100 % (08/31 0533) Weight:  [94.7 kg (208 lb 12.4 oz)] 94.7 kg (208 lb 12.4 oz) (08/31 0533)  Weight change: 12.7 kg (28 lb) Filed Weights   11/30/14 0300 12/01/14 0551 12/02/14 0533  Weight: 81.3 kg (179 lb 3.7 oz) 82 kg (180 lb 12.4 oz) 94.7 kg (208 lb 12.4 oz)    Intake/Output:     Intake/Output this shift:     CVS- RRR RS- CTA ABD- BS present soft non-distended EXT- no edema   Basic Metabolic Panel:  Recent Labs Lab 11/27/14 0025 11/28/14 1851 12/01/14 2303  NA 141 136  --   K 3.8 4.0 4.2  CL 99* 97*  --   CO2 27 28  --   GLUCOSE 75 115*  --   BUN 79* 51*  --   CREATININE 7.47* 6.29*  --   CALCIUM 8.8* 8.9  --   PHOS 4.0 3.7  --     Liver Function Tests:  Recent Labs Lab 11/27/14 0025 11/28/14 1851  ALBUMIN 2.4* 2.4*   No results for input(s): LIPASE, AMYLASE in the last 168 hours. No results for input(s): AMMONIA in the last 168 hours.  CBC:  Recent Labs Lab 11/27/14 0024 11/27/14 1008 11/28/14 1851  WBC 7.6 6.8 8.2  NEUTROABS  --  4.3  --   HGB 9.1* 11.3* 10.2*  HCT 28.5* 35.7* 32.9*  MCV 86.1 88.6 88.7  PLT 326 301 341    Cardiac Enzymes: No results for input(s): CKTOTAL, CKMB, CKMBINDEX, TROPONINI in the last 168 hours.  BNP: Invalid input(s): POCBNP  CBG:  Recent Labs Lab 12/01/14 0554 12/01/14 1123 12/01/14 1748 12/02/14 0024 12/02/14 0601  GLUCAP 112* 114* 95 86 94    Microbiology: Results for orders placed or performed during the hospital encounter of 11/10/14  MRSA PCR Screening     Status: None   Collection Time: 11/10/14  3:00 PM   Result Value Ref Range Status   MRSA by PCR NEGATIVE NEGATIVE Final    Comment:        The GeneXpert MRSA Assay (FDA approved for NASAL specimens only), is one component of a comprehensive MRSA colonization surveillance program. It is not intended to diagnose MRSA infection nor to guide or monitor treatment for MRSA infections.     Coagulation Studies: No results for input(s): LABPROT, INR in the last 72 hours.  Urinalysis: No results for input(s): COLORURINE, LABSPEC, PHURINE, GLUCOSEU, HGBUR, BILIRUBINUR, KETONESUR, PROTEINUR, UROBILINOGEN, NITRITE, LEUKOCYTESUR in the last 72 hours.  Invalid input(s): APPERANCEUR    Imaging: No results found.   Medications:   . feeding supplement (NEPRO CARB STEADY) 1,000 mL (12/02/14 0604)   . acetylcysteine  3 mL Nebulization BID  . ALPRAZolam  0.5 mg Per Tube Q T,Th,Sa-HD  . antiseptic oral rinse  7 mL Mouth Rinse QID  . aspirin  325 mg Oral Daily  . budesonide  0.25 mg Nebulization BID  . chlorhexidine  15 mL Mouth/Throat BID  . darbepoetin (ARANESP) injection - DIALYSIS  100 mcg Intravenous Q Tue-HD  . free water  50 mL Per Tube BID  .  guaifenesin  200 mg Per Tube TID  . ipratropium-albuterol  3 mL Nebulization Q6H  . lanthanum  500 mg Per Tube TID  . levETIRAcetam  500 mg Oral BID  . levothyroxine  25 mcg Oral QAC breakfast  . mirtazapine  7.5 mg Per Tube QHS  . multivitamin  1 tablet Oral QHS  . pantoprazole sodium  40 mg Per Tube Q1200   acetaminophen, ALPRAZolam, atropine, ipratropium-albuterol, ondansetron **OR** ondansetron (ZOFRAN) IV, sorbitol  Assessment/ Plan:   sment/ Plan:   ESRD- TTS dialysis  ANEMIA- stable Hb  MBD Vitamin D and binders  HTN/VOL-controlled          LOS: 7 [name] W @TODAY @11 :16 AM

## 2014-12-02 NOTE — Progress Notes (Signed)
Social Work [name], female   DOB: May 17, 1955, 59 y.o.   MRN: [id number] Met with pt and daughter who was here to inform of team conference goals-min/mod level of assist and discharge 9/17.  Daughter is not happy about her mom not having dialysis yesterday due to short staff And too busy last night.  Pt to go to HD today and then again tomorrow her normal day. She plans to talk with someone in HD about this concern. She is aware her Mom will require more care than when she went home last  Time. They are committed to taking Mom home and she will be much happier there. She plans to be here more and attend therapies with her. Will continue to work on discharge plans.

## 2014-12-02 NOTE — Progress Notes (Signed)
Speech Language Pathology Weekly Progress and Session Note  Patient Details  Name: [name] LEVELS MRN: [id number] Date of Birth: Feb 23, 1956  Beginning of progress report period: November 25, 2014 End of progress report period: December 02, 2014  Today's Date: 12/02/2014 SLP Individual Time: 0900-1000 SLP Individual Time Calculation (min): 60 min  Short Term Goals: Week 1: SLP Short Term Goal 1 (Week 1): Pt will improve management of her secretions as evidenced by ability to orally expectorate secretions and anteriorly contain her saliva with max assist multimodal cues.   SLP Short Term Goal 1 - Progress (Week 1): Progressing toward goal SLP Short Term Goal 2 (Week 1): Pt will consume PO trials with max assist multimodal cues to clear boluses from the oral cavity and to initiate swallow in a timely manner over 3 consecutive sessions prior to completion of objective swallow study.  SLP Short Term Goal 2 - Progress (Week 1): Not met SLP Short Term Goal 3 (Week 1): Pt will initiate a basic,familiar task with max assist multimodal cues over 50% of observable opportunities.  SLP Short Term Goal 3 - Progress (Week 1): Met SLP Short Term Goal 4 (Week 1): Pt will visually scan to midline in >25% of observable opportunities during basic, functional tasks with max assist multimodal cues.  SLP Short Term Goal 4 - Progress (Week 1): Progressing toward goal SLP Short Term Goal 5 (Week 1): Pt will vocalize to produce vowels in isolation with max assist multimodal cues.   SLP Short Term Goal 5 - Progress (Week 1): Not met    New Short Term Goals: Week 2: SLP Short Term Goal 1 (Week 2): Pt will improve management of her secretions as evidenced by ability to orally expectorate secretions and anteriorly contain her saliva with max assist multimodal cues.   SLP Short Term Goal 2 (Week 2): Pt will consume PO trials with max assist multimodal cues to clear boluses from the oral cavity and to initiate swallow in a  timely manner over 3 consecutive sessions prior to completion of objective swallow study.  SLP Short Term Goal 3 (Week 2): Pt will initiate a basic,familiar task with max assist multimodal cues over 75% of observable opportunities.  SLP Short Term Goal 4 (Week 2): Pt will visually scan to midline in >25% of observable opportunities during basic, functional tasks with max assist multimodal cues.  SLP Short Term Goal 5 (Week 2): Pt will vocalize to produce vowels in isolation with max assist multimodal cues.    Weekly Progress Updates:  Pt has made limited functional gains this reporting period and has met 1 out of 5 short term goals.  She requires total assist to complete basic, familiar tasks due to decreased focused attention to tasks, right inattention, decreased initiation, and motor apraxia.  Pt remains nonverbal and requires max to total assist to follow basic, 1-step commands in a familiar context.  Pt remains NPO due to decreased management of secretions and decreased respiratory status.  Pt has experienced a significant loss of function and as a result, pt would benefit from continued ST while inpatient in order to maximize functional independence and reduce burden of care prior to discharge.  Continue to anticipate that pt will require extensive 24/7 supervision and ST follow up at next level of care.  Pt and family education is ongoing.    Intensity: Minumum of 1-2 x/day, 30 to 90 minutes Frequency: 3 to 5 out of 7 days Duration/Length of Stay: 21-28 days  Treatment/Interventions:  Cognitive remediation/compensation;Cueing hierarchy;Functional tasks;Multimodal communication approach;Patient/family education;Internal/external aids;Speech/Language facilitation;Dysphagia/aspiration precaution training;Environmental controls   Daily Session  Skilled Therapeutic Interventions: Pt was seen for skilled ST targeting cognitive goals.  Upon arrival, pt was noted again with audibly wet respirations  with inability to clear secretions via reflexive or volitional coughs.  SLP completed oral care to minimize risk of infection as pt appears to be aspirating her own secretions; PO trials held this date for pt's safety.  SLP facilitated the session with a basic sorting task targeting initiation and sustained attention to task.  Pt sorted blocks into groups by color with anywhere from min to max assist depending on pt's level of distractibility and whether blocks were placed on right or left side of her table tray.  Pt demonstrated improved initiation during a pattern copying task with colored blocks.  Pt benefited from max faded to mod assist multimodal cues for initiation during task; max assist multimodal cues to attend to the right side of the pattern board.  Pt even demonstrated x1 spontaneous instance of self correction when she did not match a block to its appropriate color.  Pt remained nonverbal throughout today's session and continues to present with decreased initiation for functional communication via any modality.  Pt did shake her head "no" in response to whether she was in any pain, but SLP questions accuracy of pt's response.  Pt was left upright in wheelchair with quick release belt donned and all needs left within reach.  Continue per current plan of care.     Function:   Eating Eating                 Cognition Comprehension Comprehension assist level: Understands basic 25 - 49% of the time/ requires cueing 50 - 75% of the time  Expression   Expression assist level: Expresses basis less than 25% of the time/requires cueing >75% of the time.  Social Interaction Social Interaction assist level: Interacts appropriately 25 - 49% of time - Needs frequent redirection.  Problem Solving Problem solving assist level: Solves basic less than 25% of the time - needs direction nearly all the time or does not effectively solve problems and may need a restraint for safety  Memory Memory assist  level: Recognizes or recalls less than 25% of the time/requires cueing greater than 75% of the time   General    Pain Pain Assessment Pain Assessment: Faces Faces Pain Scale: No hurt  Therapy/Group: Individual Therapy  Analiza Cowger, Selinda Orion 12/02/2014, 11:19 AM

## 2014-12-02 NOTE — Procedures (Signed)
I have seen and examined this patient and agree with the plan of care . Patient seen in dialysis and appears to be doing well  [facility] W 12/02/2014, 5:08 PM

## 2014-12-02 NOTE — Progress Notes (Signed)
Occupational Therapy Session Note  Patient Details  Name: [name] MRN: [id number] Date of Birth: 1955-06-19  Today's Date: 12/02/2014 OT Individual Time: 1000-1100 OT Individual Time Calculation (min): 60 min    Short Term Goals: Week 1:  OT Short Term Goal 1 (Week 1): Pt will maintain static sittting balance for 10 mins with no more than mod assist in preparation for selfcare tasks.   OT Short Term Goal 2 (Week 1): Pt will complete 2 grooming tasks with no more than mod demonstrational cueing for initiation and completion. OT Short Term Goal 3 (Week 1): Pt will perform UB bathing with mod assist sitting supported. OT Short Term Goal 4 (Week 1): Pt will use the RUE for washing face or UB with no more than mod assist. OT Short Term Goal 5 (Week 1): Pt will perform toilet transfer stand pivot with no more than mod assist.   Skilled Therapeutic Interventions/Progress Updates:    ADL retraining with focus on initiation, functional use of RUE, Rt attention, and transfers.  Increased wait time with UB dressing tasks to facilitate initiation, also did trials of frequent cues to attempt to increase initiation with no noticeable difference in strategy.  Stand pivot transfers w/c <> drop arm BSC for toileting, pt incontinent of bowel and no further movement on toilet.  Sit > stand with therapist providing max assist for sit > stand transition and then min assist to maintain standing balance while 2nd person completed hygiene and assisted with clothing management.  Engaged in table top sorting task with focus on following 2 step directions and initiation.  Pt unable to complete 2 step direction and completing approx 50% when modified to 1 step directions.  Pt tearful throughout session.  Therapy Documentation Precautions:  Precautions Precautions: Fall Precaution Comments: fall, NPO, pusher to the right at times, non-verbal,  Restrictions Weight Bearing Restrictions: No Pain: Pain  Assessment Pain Assessment: Faces Faces Pain Scale: No hurt  Function:   Upper Body Dressing/Undressing Upper body dressing   What is the patient wearing?: Pull over shirt/dress       Pull over shirt/dress - Perfomed by helper: Thread/unthread right sleeve;Thread/unthread left sleeve;Put head through opening;Pull shirt over trunk        Upper body assist         Lower Body Dressing/Undressing Lower body dressing   What is the patient wearing?: Non-skid slipper socks;Pants       Pants- Performed by helper: Thread/unthread right pants leg;Thread/unthread left pants leg;Pull pants up/down   Non-skid slipper socks- Performed by helper: Don/doff right sock;Don/doff left sock                  Lower body assist Assist Level: 2 Helpers       Toileting Toileting     Toileting steps completed by helper: Adjust clothing prior to toileting;Adjust clothing after toileting;Performs perineal hygiene Toileting Assistive Devices: Grab bar or rail  Toileting assist Assist level: Two helpers      Transfers Sit to stand transfer        Chair/bed Financial controller transfer assistive device: Drop arm commode       Assist level to toilet: 2 helpers Assist level from toilet: 2 helpers  Haematologist Comprehension Comprehension assist level: Understands basic 25 - 49% of the time/ requires  cueing 50 - 75% of the time  Expression Expression assist level: Expresses basis less than 25% of the time/requires cueing >75% of the time.  Social Interaction Social Interaction assist level: Interacts appropriately 25 - 49% of time - Needs frequent redirection.  Problem Solving Problem solving assist level: Solves basic less than 25% of the time - needs direction nearly all the time or does not effectively solve problems and may need a restraint for safety  Memory Memory assist level: Recognizes or recalls less than  25% of the time/requires cueing greater than 75% of the time    Therapy/Group: Individual Therapy  Rosalio Loud 12/02/2014, 12:28 PM

## 2014-12-02 NOTE — Progress Notes (Signed)
Subjective/Complaints: Speech therapy not working on swallowing due to poor prognosis. Remains globally aphasic. Ambulating with PT plus to max assist but went over 100 feet yesterday   ROS cannot obtain due to global aphasia   Objective: Vital Signs: Blood pressure 133/80, pulse 102, temperature 98.1 F (36.7 C), temperature source Oral, resp. rate 19, weight 94.7 kg (208 lb 12.4 oz), SpO2 100 %. No results found. Results for orders placed or performed during the hospital encounter of 11/25/14 (from the past 72 hour(s))  Glucose, capillary     Status: Abnormal   Collection Time: 11/29/14 12:02 PM  Result Value Ref Range   Glucose-Capillary 107 (H) 65 - 99 mg/dL   Comment 1 Notify RN   Glucose, capillary     Status: None   Collection Time: 11/29/14  5:58 PM  Result Value Ref Range   Glucose-Capillary 92 65 - 99 mg/dL   Comment 1 Notify RN   Glucose, capillary     Status: Abnormal   Collection Time: 11/30/14 12:00 AM  Result Value Ref Range   Glucose-Capillary 100 (H) 65 - 99 mg/dL  Glucose, capillary     Status: Abnormal   Collection Time: 11/30/14  6:28 AM  Result Value Ref Range   Glucose-Capillary 105 (H) 65 - 99 mg/dL  Glucose, capillary     Status: None   Collection Time: 11/30/14  3:05 PM  Result Value Ref Range   Glucose-Capillary 95 65 - 99 mg/dL  Glucose, capillary     Status: None   Collection Time: 11/30/14  9:11 PM  Result Value Ref Range   Glucose-Capillary 97 65 - 99 mg/dL  Glucose, capillary     Status: Abnormal   Collection Time: 12/01/14  5:54 AM  Result Value Ref Range   Glucose-Capillary 112 (H) 65 - 99 mg/dL  Glucose, capillary     Status: Abnormal   Collection Time: 12/01/14 11:23 AM  Result Value Ref Range   Glucose-Capillary 114 (H) 65 - 99 mg/dL  Glucose, capillary     Status: None   Collection Time: 12/01/14  5:48 PM  Result Value Ref Range   Glucose-Capillary 95 65 - 99 mg/dL  Potassium     Status: None   Collection Time: 12/01/14  11:03 PM  Result Value Ref Range   Potassium 4.2 3.5 - 5.1 mmol/L  Glucose, capillary     Status: None   Collection Time: 12/02/14 12:24 AM  Result Value Ref Range   Glucose-Capillary 86 65 - 99 mg/dL  Glucose, capillary     Status: None   Collection Time: 12/02/14  6:01 AM  Result Value Ref Range   Glucose-Capillary 94 65 - 99 mg/dL     HEENT: poor oral movements  facial diplegia, cannot open mouth to command Cardio: RRR and no murmur Resp: CTA B/L.no use of accesory muscles, exp wheezes but still with slight tachypnea ~19 this am GI: BS positive and NT, ND, pior miline abd incision with extensive scarring Extremity:  Pulses positive and No Edema Skin:   Other G tube site NT Neuro: Flat but very alert, Cranial Nerve Abnormalities bilateral central 7, Abnormal Sensory some withdrawl to pinch in all 4 ext, Abnormal Motor 0/5 in RUE, 2- LUE, trace R and left knee ext, Abnormal FMC Tone  increased ext tone in BLE, increased flexor tone in RUE, Aphasic and Apraxic Musc/Skel:  Other decrease ROM in BIlateral knees and Right shoulder and elbow due to tone Gen NAD   Assessment/Plan:  1. 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 Physiatrist is providing close team supervision and 24 hour management of active medical problems listed below. Physiatrist and rehab team continue to assess barriers to discharge/monitor patient progress toward functional and medical goals.  Team conference today please see physician documentation under team conference tab, met with team face-to-face to discuss problems,progress, and goals. Formulized individual treatment plan based on medical history, underlying problem and comorbidities. FIM: Function - Bathing Position: Wheelchair/chair at  sink Body parts bathed by patient: Right upper leg, Left upper leg Body parts bathed by helper: Right arm, Left arm, Chest, Buttocks (completed UB bathing only) Bathing not applicable: Abdomen, Front perineal area, Buttocks, Right upper leg, Left upper leg, Right lower leg, Left lower leg Assist Level: 2 helpers  Function- Upper Body Dressing/Undressing What is the patient wearing?: Pull over shirt/dress Pull over shirt/dress - Perfomed by helper: Thread/unthread right sleeve, Thread/unthread left sleeve, Put head through opening, Pull shirt over trunk Function - Lower Body Dressing/Undressing What is the patient wearing?: Non-skid slipper socks, Pants Position: Wheelchair/chair at sink Pants- Performed by helper: Thread/unthread right pants leg, Thread/unthread left pants leg, Pull pants up/down Non-skid slipper socks- Performed by helper: Don/doff right sock, Don/doff left sock Assist Level: 2 Helpers  Function - Toileting Toileting activity did not occur: No continent bowel/bladder event Toileting steps completed by helper: Adjust clothing prior to toileting, Adjust clothing after toileting, Performs perineal hygiene Toileting Assistive Devices: Grab bar or rail Assist level: Two helpers  Function - Air cabin crew transfer activity did not occur: Safety/medical concerns  Function - Chair/bed transfer Chair/bed transfer method: Stand pivot Chair/bed transfer assist level: Maximal assist (Pt 25 - 49%/lift and lower) Chair/bed transfer details: Visual cues/gestures for sequencing, Manual facilitation for weight shifting, Manual facilitation for weight bearing, Manual facilitation for placement  Function - Locomotion: Wheelchair Will patient use wheelchair at discharge?: Yes Wheelchair activity did not occur:  (Attempted hand-over-hand facilitation for w/c propulsion, visual demonstration, however no pt initiation evident) Assist Level: Dependent (Pt equals 0%) Function -  Locomotion: Ambulation Ambulation activity did not occur: Safety/medical concerns (Attempted ambulation, however upon standing x3 reps pt unable to step (unable to determine if due to$Remove' \\apraxia'[id number]$  or pt fearful)) Assistive device: Other (comment) (three-muskateers) Max distance: 100 Assist level: 2 helpers Walk 10 feet activity did not occur: Safety/medical concerns Assist level: 2 helpers Walk 50 feet with 2 turns activity did not occur: Safety/medical concerns Assist level: 2 helpers Walk 150 feet activity did not occur: Safety/medical concerns Walk 10 feet on uneven surfaces activity did not occur: Safety/medical concerns  Function - Comprehension Comprehension: Auditory Comprehension assist level: Understands basic 25 - 49% of the time/ requires cueing 50 - 75% of the time  Function - Expression Expression: Nonverbal Expression assist level: Expresses basis less than 25% of the time/requires cueing >75% of the time.  Function - Social Interaction Social Interaction assist level: Interacts appropriately 25 - 49% of time - Needs frequent redirection.  Function - Problem Solving Problem solving assist level: Solves basic less than 25% of the time - needs direction nearly all the time or does not effectively solve problems and may need a restraint for safety  Function - Memory Memory assist level: Recognizes or recalls less than 25% of the time/requires cueing greater than 75% of the  time Patient normally able to recall (first 3 days only): None of the above  Medical Problem List and Plan: 1. Functional deficits secondary to left MCA infarct/respiratory failure. Extubated 11/20/2014. RR tends to be elevated but sats ok, wheezing will schedule nebs 2. DVT Prophylaxis/Anticoagulation: SCDs. Monitor for any signs of DVT. 8/12 venous dopplers neg 3. Pain Management: Tylenol as needed, no apparent pain during PT 4. Dysphagia. Patient is nothing by mouth. Status post gastrostomy tube  08/12/2014 per interventional radiology for nutritional support. poor prognosis for improvement given severe bilateral deficits, do not anticipate d/c PEG during this admission, discussed with SLP not to spend much time with swallow   5. Neuropsych: This patient is not capable of making decisions on her own behalf. 6. Skin/Wound Care: Routine skin checks. PEG site maintenance. continue turning and nutrition 7. Fluids/Electrolytes/Nutrition: Routine I&O. Adjust TF based on nutritional needs. Renal adjusting dialysis, complicated situation, Not a diabetic, getting CBGs because of TF, these have been normal 8. End-stage renal disease with hemodialysis. Follow-up per renal services. Hemodialysis in PM after therapies to avoid conflict, Nepro for nutrition, last K was normal at 4.2 9. Seizure prophylaxis. Keppra 500 mg twice a day, monitor drug toxicity. No seizures 10. Hypothyroidism. Synthroid 11. Chronic anemia. Aranesp weekly with dialysis, hgb 10.2 on 8/27 12. History of asthma. Schedule nebulizers given aphasia. Anxiety component also.  13. Mood/anxiety. Continue Xanax on dialysis days 14.  Hx of recurrent resp failure, high risk for PNA given comorbid conditions,  RR increases at times. Comfortable. Secretions better. Afebrile. No distress.  -continue to aggressively monitor, aspiration precautions,  -wean oxygen down to keep sats >90% 15.  Bowel incont- monitor skin 16.  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

## 2014-12-02 NOTE — Progress Notes (Signed)
Spoke with Dan about pt not going to Dialysis yesterday and calling Eunice the on call about pt status. Harveer Sadler,RN 

## 2014-12-03 ENCOUNTER — Inpatient Hospital Stay (HOSPITAL_COMMUNITY): Payer: Medicaid Other

## 2014-12-03 ENCOUNTER — Inpatient Hospital Stay (HOSPITAL_COMMUNITY): Payer: Medicaid Other | Admitting: Speech Pathology

## 2014-12-03 ENCOUNTER — Inpatient Hospital Stay (HOSPITAL_COMMUNITY): Payer: Medicaid Other | Admitting: Occupational Therapy

## 2014-12-03 ENCOUNTER — Inpatient Hospital Stay (HOSPITAL_COMMUNITY): Payer: Medicaid Other | Admitting: Physical Therapy

## 2014-12-03 LAB — GLUCOSE, CAPILLARY
GLUCOSE-CAPILLARY: 97 mg/dL (ref 65–99)
GLUCOSE-CAPILLARY: 93 mg/dL (ref 65–99)
Glucose-Capillary: 75 mg/dL (ref 65–99)
Glucose-Capillary: 90 mg/dL (ref 65–99)

## 2014-12-03 NOTE — Progress Notes (Signed)
Subjective/Complaints: OT notes some minor improvements in alertness Remains globally aphasic. Ambulating with PT needs to go in straight line   ROS cannot obtain due to global aphasia   Objective: Vital Signs: Blood pressure 122/81, pulse 97, temperature 97.5 F (36.4 C), temperature source Oral, resp. rate 20, weight 74.5 kg (164 lb 3.9 oz), SpO2 100 %. No results found. Results for orders placed or performed during the hospital encounter of 11/25/14 (from the past 72 hour(s))  Glucose, capillary     Status: None   Collection Time: 11/30/14  3:05 PM  Result Value Ref Range   Glucose-Capillary 95 65 - 99 mg/dL  Glucose, capillary     Status: None   Collection Time: 11/30/14  9:11 PM  Result Value Ref Range   Glucose-Capillary 97 65 - 99 mg/dL  Glucose, capillary     Status: Abnormal   Collection Time: 12/01/14  5:54 AM  Result Value Ref Range   Glucose-Capillary 112 (H) 65 - 99 mg/dL  Glucose, capillary     Status: Abnormal   Collection Time: 12/01/14 11:23 AM  Result Value Ref Range   Glucose-Capillary 114 (H) 65 - 99 mg/dL  Glucose, capillary     Status: None   Collection Time: 12/01/14  5:48 PM  Result Value Ref Range   Glucose-Capillary 95 65 - 99 mg/dL  Potassium     Status: None   Collection Time: 12/01/14 11:03 PM  Result Value Ref Range   Potassium 4.2 3.5 - 5.1 mmol/L  Glucose, capillary     Status: None   Collection Time: 12/02/14 12:24 AM  Result Value Ref Range   Glucose-Capillary 86 65 - 99 mg/dL  Glucose, capillary     Status: None   Collection Time: 12/02/14  6:01 AM  Result Value Ref Range   Glucose-Capillary 94 65 - 99 mg/dL  Glucose, capillary     Status: Abnormal   Collection Time: 12/02/14 12:01 PM  Result Value Ref Range   Glucose-Capillary 105 (H) 65 - 99 mg/dL  Renal function panel     Status: Abnormal   Collection Time: 12/02/14  6:57 PM  Result Value Ref Range   Sodium 134 (L) 135 - 145 mmol/L   Potassium 4.7 3.5 - 5.1 mmol/L   Chloride 92 (L) 101 - 111 mmol/L   CO2 27 22 - 32 mmol/L   Glucose, Bld 80 65 - 99 mg/dL   BUN 76 (H) 6 - 20 mg/dL   Creatinine, Ser 7.92 (H) 0.44 - 1.00 mg/dL   Calcium 9.4 8.9 - 10.3 mg/dL   Phosphorus 4.4 2.5 - 4.6 mg/dL   Albumin 2.6 (L) 3.5 - 5.0 g/dL   GFR calc non Af Amer 5 (L) >60 mL/min   GFR calc Af Amer 6 (L) >60 mL/min    Comment: (NOTE) The eGFR has been calculated using the CKD EPI equation. This calculation has not been validated in all clinical situations. eGFR's persistently <60 mL/min signify possible Chronic Kidney Disease.    Anion gap 15 5 - 15  CBC     Status: Abnormal   Collection Time: 12/02/14  7:01 PM  Result Value Ref Range   WBC 9.2 4.0 - 10.5 K/uL   RBC 3.59 (L) 3.87 - 5.11 MIL/uL   Hemoglobin 9.6 (L) 12.0 - 15.0 g/dL   HCT 30.6 (L) 36.0 - 46.0 %   MCV 85.2 78.0 - 100.0 fL   MCH 26.7 26.0 - 34.0 pg   MCHC 31.4 30.0 -  36.0 g/dL   RDW 18.4 (H) 11.5 - 15.5 %   Platelets 324 150 - 400 K/uL  Glucose, capillary     Status: Abnormal   Collection Time: 12/02/14  8:57 PM  Result Value Ref Range   Glucose-Capillary 113 (H) 65 - 99 mg/dL  Glucose, capillary     Status: Abnormal   Collection Time: 12/02/14 11:55 PM  Result Value Ref Range   Glucose-Capillary 137 (H) 65 - 99 mg/dL  Glucose, capillary     Status: None   Collection Time: 12/03/14  6:06 AM  Result Value Ref Range   Glucose-Capillary 97 65 - 99 mg/dL     HEENT: poor oral movements  facial diplegia, cannot open mouth to command Cardio: RRR and no murmur Resp: CTA B/L.no use of accesory muscles, exp wheezes absent but upper airway sounds, not able to cough on command GI: BS positive and NT, ND, pior miline abd incision with extensive scarring Extremity:  Pulses positive and No Edema Skin:   Other G tube site NT Neuro: Flat but very alert, Cranial Nerve Abnormalities bilateral central 7, Abnormal Sensory some withdrawl to pinch in all 4 ext, Abnormal Motor 1+/5 in RUE, 2- LUE, trace R and  left knee ext, Abnormal FMC Tone  increased ext tone in BLE, increased flexor tone in RUE, Aphasic and Apraxic Musc/Skel:  Other decrease ROM in BIlateral knees and Right shoulder and elbow due to tone Gen NAD   Assessment/Plan: 1. 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 Physiatrist is providing close team supervision and 24 hour management of active medical problems listed below. Physiatrist and rehab team continue to assess barriers to discharge/monitor patient progress toward functional and medical goals.   FIM: Function - Bathing Position: Wheelchair/chair at sink Body parts bathed by patient: Right upper leg, Left upper leg Body parts bathed by helper: Right arm, Left arm, Chest, Buttocks (completed UB bathing only) Bathing not applicable: Abdomen, Front perineal area, Buttocks, Right upper leg, Left upper leg, Right lower leg, Left lower leg Assist Level: 2 helpers  Function- Upper Body Dressing/Undressing What is the patient wearing?: Pull over shirt/dress Pull over shirt/dress - Perfomed by helper: Thread/unthread right sleeve, Thread/unthread left sleeve, Put head through opening, Pull shirt over trunk Function - Lower Body Dressing/Undressing What is the patient wearing?: Non-skid slipper socks, Pants Position: Wheelchair/chair at sink Pants- Performed by helper: Thread/unthread right pants leg, Thread/unthread left pants leg, Pull pants up/down Non-skid slipper socks- Performed by helper: Don/doff right sock, Don/doff left sock Assist Level: 2 Helpers  Function - Toileting Toileting activity did not occur: No continent bowel/bladder event Toileting steps completed by helper: Adjust clothing prior to toileting, Adjust clothing after toileting, Performs  perineal hygiene Toileting Assistive Devices: Other (comment) (UE support on sink) Assist level: Two helpers  Function - Air cabin crew transfer activity did not occur: Safety/medical concerns Toilet transfer assistive device: Drop arm commode Assist level to toilet: 2 helpers Assist level from toilet: 2 helpers  Function - Chair/bed transfer Chair/bed transfer method: Stand pivot Chair/bed transfer assist level: Maximal assist (Pt 25 - 49%/lift and lower) Chair/bed transfer assistive device: Armrests, Bedrails Chair/bed transfer details: Visual cues/gestures for sequencing, Manual facilitation for weight shifting, Manual facilitation for weight bearing, Manual facilitation for placement  Function - Locomotion: Wheelchair Will patient use wheelchair at  discharge?: Yes Wheelchair activity did not occur:  (Attempted hand-over-hand facilitation for w/c propulsion, visual demonstration, however no pt initiation evident) Assist Level: Dependent (Pt equals 0%) Function - Locomotion: Ambulation Ambulation activity did not occur: Safety/medical concerns (Attempted ambulation, however upon standing x3 reps pt unable to step (unable to determine if due to$Remove' \\apraxia'[id number]$  or pt fearful)) Assistive device: Other (comment) (three-muskateers) Max distance: 150 Assist level: 2 helpers Walk 10 feet activity did not occur: Safety/medical concerns Assist level: 2 helpers Walk 50 feet with 2 turns activity did not occur: Safety/medical concerns Assist level: 2 helpers Walk 150 feet activity did not occur: Safety/medical concerns Assist level: 2 helpers Walk 10 feet on uneven surfaces activity did not occur: Safety/medical concerns  Function - Comprehension Comprehension: Auditory Comprehension assist level: Understands basic 25 - 49% of the time/ requires cueing 50 - 75% of the time (difficult to assess )  Function - Expression Expression: Nonverbal Expression assist level: Expresses basis less  than 25% of the time/requires cueing >75% of the time.  Function - Social Interaction Social Interaction assist level: Interacts appropriately less than 25% of the time. May be withdrawn or combative.  Function - Problem Solving Problem solving assist level: Solves basic less than 25% of the time - needs direction nearly all the time or does not effectively solve problems and may need a restraint for safety  Function - Memory Memory assist level: Recognizes or recalls less than 25% of the time/requires cueing greater than 75% of the time (difficult to assess, pt nonverbal ) Patient normally able to recall (first 3 days only): None of the above  Medical Problem List and Plan: 1. Functional deficits secondary to left MCA infarct/respiratory failure. Extubated 11/20/2014. RR tends to be elevated but sats ok, wheezing will schedule nebs 2. DVT Prophylaxis/Anticoagulation: SCDs. Monitor for any signs of DVT. 8/12 venous dopplers neg 3. Pain Management: Tylenol as needed, no apparent pain during PT 4. Dysphagia. Patient is nothing by mouth. Status post gastrostomy tube 08/12/2014 per interventional radiology for nutritional support. poor prognosis for improvement given severe bilateral deficits, do not anticipate d/c PEG during this admission, discussed with SLP not to spend much time with swallow   5. Neuropsych: This patient is not capable of making decisions on her own behalf. 6. Skin/Wound Care: Routine skin checks. PEG site maintenance. continue turning and nutrition 7. Fluids/Electrolytes/Nutrition: Routine I&O. Adjust TF based on nutritional needs. Renal adjusting dialysis, complicated situation, Not a diabetic, getting CBGs because of TF, these have been normal 8. End-stage renal disease with hemodialysis. Follow-up per renal services. Hemodialysis in PM after therapies to avoid conflict, Nepro for nutrition, last K was normal at 4.2 9. Seizure prophylaxis. Keppra 500 mg twice a day, monitor  drug toxicity. No seizures 10. Hypothyroidism. Synthroid 11. Chronic anemia. Aranesp weekly with dialysis, hgb monitored in HD 12. History of asthma. Schedule nebulizers given aphasia. Anxiety component but this aspect is improving 13. Mood/anxiety. Continue Xanax on dialysis days 14.  Hx of recurrent resp failure, high risk for PNA given comorbid conditions,  RR increases at times. Comfortable. Secretions better. Afebrile. No distress.  -continue to aggressively monitor, aspiration precautions,  -wean oxygen down to keep sats >90% 15.  Bowel incont- monitor skin 16.  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

## 2014-12-03 NOTE — Progress Notes (Signed)
[name]   Subjective:   Interval History no issues  Objective:  Vital signs in last 24 hours:  Temp:  [97.5 F (36.4 C)-98.4 F (36.9 C)] 97.5 F (36.4 C) (09/01 0539) Pulse Rate:  [86-117] 97 (09/01 0539) Resp:  [15-26] 20 (09/01 0539) BP: (118-174)/(54-95) 122/81 mmHg (09/01 0539) SpO2:  [93 %-100 %] 100 % (09/01 0539) Weight:  [73.4 kg (161 lb 13.1 oz)-76.3 kg (168 lb 3.4 oz)] 74.5 kg (164 lb 3.9 oz) (09/01 0539)  Weight change: -18.8 kg (-41 lb 7.1 oz) Filed Weights   12/02/14 2020 12/02/14 2059 12/03/14 0539  Weight: 73.4 kg (161 lb 13.1 oz) 76.3 kg (168 lb 3.4 oz) 74.5 kg (164 lb 3.9 oz)    Intake/Output: I/O last 3 completed shifts: In: 490 [NG/GT:490] Out: 3005 [Other:3005]   Intake/Output this shift:     CVS- RRR RS- CTA ABD- BS present soft non-distended EXT- no edema   Basic Metabolic Panel:  Recent Labs Lab 11/27/14 0025 11/28/14 1851 12/01/14 2303 12/02/14 1857  NA 141 136  --  134*  K 3.8 4.0 4.2 4.7  CL 99* 97*  --  92*  CO2 27 28  --  27  GLUCOSE 75 115*  --  80  BUN 79* 51*  --  76*  CREATININE 7.47* 6.29*  --  7.92*  CALCIUM 8.8* 8.9  --  9.4  PHOS 4.0 3.7  --  4.4    Liver Function Tests:  Recent Labs Lab 11/27/14 0025 11/28/14 1851 12/02/14 1857  ALBUMIN 2.4* 2.4* 2.6*   No results for input(s): LIPASE, AMYLASE in the last 168 hours. No results for input(s): AMMONIA in the last 168 hours.  CBC:  Recent Labs Lab 11/27/14 0024 11/27/14 1008 11/28/14 1851 12/02/14 1901  WBC 7.6 6.8 8.2 9.2  NEUTROABS  --  4.3  --   --   HGB 9.1* 11.3* 10.2* 9.6*  HCT 28.5* 35.7* 32.9* 30.6*  MCV 86.1 88.6 88.7 85.2  PLT 326 301 341 324    Cardiac Enzymes: No results for input(s): CKTOTAL, CKMB, CKMBINDEX, TROPONINI in the last 168 hours.  BNP: Invalid input(s): POCBNP  CBG:  Recent Labs Lab 12/02/14 1201 12/02/14 2057 12/02/14 2355 12/03/14 0606 12/03/14 1202  GLUCAP 105* 113* 137* 97  93    Microbiology: Results for orders placed or performed during the hospital encounter of 11/10/14  MRSA PCR Screening     Status: None   Collection Time: 11/10/14  3:00 PM  Result Value Ref Range Status   MRSA by PCR NEGATIVE NEGATIVE Final    Comment:        The GeneXpert MRSA Assay (FDA approved for NASAL specimens only), is one component of a comprehensive MRSA colonization surveillance program. It is not intended to diagnose MRSA infection nor to guide or monitor treatment for MRSA infections.     Coagulation Studies: No results for input(s): LABPROT, INR in the last 72 hours.  Urinalysis: No results for input(s): COLORURINE, LABSPEC, PHURINE, GLUCOSEU, HGBUR, BILIRUBINUR, KETONESUR, PROTEINUR, UROBILINOGEN, NITRITE, LEUKOCYTESUR in the last 72 hours.  Invalid input(s): APPERANCEUR    Imaging: No results found.   Medications:   . feeding supplement (NEPRO CARB STEADY) 1,000 mL (12/03/14 0605)   . ALPRAZolam  0.5 mg Per Tube Q T,Th,Sa-HD  . antiseptic oral rinse  7 mL Mouth Rinse QID  . aspirin  325 mg Oral Daily  . budesonide  0.25 mg Nebulization BID  . chlorhexidine  15 mL Mouth/Throat BID  . [START ON 12/08/2014] darbepoetin (ARANESP) injection - DIALYSIS  100 mcg Intravenous Q Tue-HD  . free water  50 mL Per Tube BID  . guaifenesin  200 mg Per Tube TID  . ipratropium-albuterol  3 mL Nebulization Q6H  . lanthanum  500 mg Per Tube TID  . levETIRAcetam  500 mg Oral BID  . levothyroxine  25 mcg Oral QAC breakfast  . mirtazapine  7.5 mg Per Tube QHS  . multivitamin  1 tablet Oral QHS  . 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

## 2014-12-03 NOTE — Progress Notes (Signed)
Speech Language Pathology Daily Session Note  Patient Details  Name: Quanda C Rami MRN: [id number] Date of Birth: [birth year]  Today's Date: 12/03/2014 SLP Individual Time: 1105-1200 SLP Individual Time Calculation (min): 55 min  Short Term Goals: Week 2: SLP Short Term Goal 1 (Week 2): Pt will improve management of her secretions as evidenced by ability to orally expectorate secretions and anteriorly contain her saliva with max assist multimodal cues.   SLP Short Term Goal 2 (Week 2): Pt will consume PO trials with max assist multimodal cues to clear boluses from the oral cavity and to initiate swallow in a timely manner over 3 consecutive sessions prior to completion of objective swallow study.  SLP Short Term Goal 3 (Week 2): Pt will initiate a basic,familiar task with max assist multimodal cues over 75% of observable opportunities.  SLP Short Term Goal 4 (Week 2): Pt will visually scan to midline in >25% of observable opportunities during basic, functional tasks with max assist multimodal cues.  SLP Short Term Goal 5 (Week 2): Pt will vocalize to produce vowels in isolation with max assist multimodal cues.    Skilled Therapeutic Interventions:  Pt was seen for skilled ST targeting goals for dysphagia and cognition.  Upon arrival, pt was seated upright in wheelchair, awake, flat affect, but nodded in agreement to participate in ST.  SLP facilitated the session with trials of ice chips to continue working towards initiation of PO diet.  Pt consumed ice chips with hand over hand assist for initiation and demonstrated wet, congested cough on 2 out of 4 trials which is likely due to suspected delayed swallow initiation.  Do not recommend PO advancement at this time.  SLP also facilitated the session with a symbol cancellation task targeting visual scanning to the right of midline and initiation of task.  Pt located 10 out of 10 symbols with mod assist verbal and visual cues from both the left and right  visual field.  Pt remains nonverbal and was unable to convey orientation to place, date, or situation despite max assist multimodal cues and written choice of 2.  Pt demonstrated improved yes/no accuracy for answering basic, immediate, environmental questions as long as SLP was standing and making eye contact with pt in pt's left visual field.  Pt was left upright in wheelchair with quick release belt donned and call bell left within reach.  Continue per current plan of care.   Function:  Eating Eating    Eating Assist Level: Hand over hand assist;Help managing cup/glass;Helper brings food to mouth (with trials )       Helper Brings Food to Mouth: Every scoop   Cognition Comprehension Comprehension assist level: Understands basic 25 - 49% of the time/ requires cueing 50 - 75% of the time  Expression   Expression assist level: Expresses basis less than 25% of the time/requires cueing >75% of the time.  Social Interaction Social Interaction assist level: Interacts appropriately 25 - 49% of time - Needs frequent redirection.  Problem Solving Problem solving assist level: Solves basic less than 25% of the time - needs direction nearly all the time or does not effectively solve problems and may need a restraint for safety  Memory Memory assist level: Recognizes or recalls less than 25% of the time/requires cueing greater than 75% of the time    Pain Pain Assessment Pain Assessment: Faces Faces Pain Scale: No hurt  Therapy/Group: Individual Therapy  Keithon Mccoin, Melanee Spry 12/03/2014, 12:57 PM

## 2014-12-03 NOTE — Progress Notes (Signed)
Occupational Therapy Session Note  Patient Details  Name: [name] MRN: [id number] Date of Birth: October 23, 1955  Today's Date: 12/03/2014 OT Individual Time: 1345-1415 OT Individual Time Calculation (min): 30 min    Short Term Goals: Week 1:  OT Short Term Goal 1 (Week 1): Pt will maintain static sittting balance for 10 mins with no more than mod assist in preparation for selfcare tasks.   OT Short Term Goal 2 (Week 1): Pt will complete 2 grooming tasks with no more than mod demonstrational cueing for initiation and completion. OT Short Term Goal 3 (Week 1): Pt will perform UB bathing with mod assist sitting supported. OT Short Term Goal 4 (Week 1): Pt will use the RUE for washing face or UB with no more than mod assist. OT Short Term Goal 5 (Week 1): Pt will perform toilet transfer stand pivot with no more than mod assist.   Skilled Therapeutic Interventions/Progress Updates: NMR of RUE (15 min) with focus on improved sensory integration through P/SROM, PNF D2 flexion/extension.   ADL-retraining with focus on incorporation of RUE during hair care.   Pt able to grasp brush with right hand and attempt parting her hair with mod assist to support right elbow and hand-over-hand guidance to maintain grip at right hand.   Pt attentive to herself in mirror and improved alertness and eye contact with therapist after therapist applied Olive Oil hair care product from pt's countertop.   Pt left in w/c at end of session with laptray, call light (paddle) and suction aid within reach.     Therapy Documentation Precautions:  Precautions Precautions: Fall Precaution Comments: fall, NPO, pusher to the right at times, non-verbal,  Restrictions Weight Bearing Restrictions: No  Vital Signs: Therapy Vitals Temp: 97.9 F (36.6 C) Temp Source: Axillary Pulse Rate: 97 Resp: 18 BP: 112/69 mmHg Patient Position (if appropriate): Sitting Oxygen Therapy SpO2: 98 % O2 Device: Nasal Cannula O2 Flow Rate  (L/min): 2 L/min   Pain: Pain Assessment Pain Assessment: Faces Faces Pain Scale: No hurt   Function:   Eating Eating Eating activity did not occur: Safety/medical concerns Eating Assist Level: Hand over hand assist;Help managing cup/glass;Helper brings food to mouth (with trials )       Helper Brings Food to Mouth: Every scoop   Grooming Oral Care,Brush Teeth, Clean Dentures Activity:             Wash, Rinse, Dry Face Activity          Wash, Rinse, Dry Hands Activity          Brush, Comb Hair Activity        Shave Activity          Apply Makeup Activity                                                             Bathing Bathing position      Bathing parts      Bathing assist         Upper Body Dressing/Undressing Upper body dressing                    Upper body assist         Lower Body Dressing/Undressing Lower body dressing  Lower body assist         Toileting Toileting          Toileting assist      Bed Mobility Roll left and right activity        Sit to lying activity        Lying to sitting activity        Mobility details      Transfers Sit to stand transfer   Sit to stand assist level: Maximal assist (Pt 25 - 49%/lift and lower) Sit to stand assistive device: Armrests  Chair/bed transfer              Toilet transfer                Tub/shower transfer             Cognition Comprehension Comprehension assist level: Understands basic 25 - 49% of the time/ requires cueing 50 - 75% of the time  Expression Expression assist level: Expresses basis less than 25% of the time/requires cueing >75% of the time.  Social Interaction Social Interaction assist level: Interacts appropriately 25 - 49% of time - Needs frequent redirection.  Problem Solving Problem solving assist level: Solves basic less than 25% of the time - needs direction nearly all the time  or does not effectively solve problems and may need a restraint for safety  Memory Memory assist level: Recognizes or recalls less than 25% of the time/requires cueing greater than 75% of the time    Therapy/Group: Individual Therapy  Sharalyn Lomba 12/03/2014, 2:38 PM

## 2014-12-03 NOTE — Plan of Care (Signed)
Problem: RH BOWEL ELIMINATION Goal: RH STG MANAGE BOWEL WITH ASSISTANCE STG Manage Bowel with Mod.Assistance.  Outcome: Not Progressing Total assist. Incontinent.   Problem: RH SKIN INTEGRITY Goal: RH STG SKIN FREE OF INFECTION/BREAKDOWN With Mod. Assisst  Outcome: Not Progressing MASD WOC consulted

## 2014-12-03 NOTE — Progress Notes (Signed)
Physical Therapy Session Note  Patient Details  Name: [name] MRN: [id number] Date of Birth: 1955-06-18  Today's Date: 12/03/2014 PT Individual Time: 1300-1330 PT Individual Time Calculation (min): 30 min   Short Term Goals: Week 1:  PT Short Term Goal 1 (Week 1): Will perform bed mobility with modA and use of bedrails PT Short Term Goal 2 (Week 1): Will perform supine <>sit maxA +1 and mod cues PT Short Term Goal 3 (Week 1): Will perform stand pivot transfer modA PT Short Term Goal 4 (Week 1): Will perform ambulation x25' with LRAD and maxA +1 PT Short Term Goal 5 (Week 1): Will perform w/c propulsion x50' with modA  Skilled Therapeutic Interventions/Progress Updates:    Pt received up in w/c with G-tube running and supplemental oxygen through nasal cannula. Pt nonverbal throughout PT session, but shakes head in response to one of PT's session. Neuromuscular Reeducation: PT instructs pt in repeated sit to stand from w/c x 5 reps req mod A once and max A all other times. PT stretches pt's R elbow into full extension with extra time for tone reduction. PT provides max-tot A for lateral leans onto bed for pressure relief on bottom - each direction x 1 minute.   Pt with poor participation during PT session, likely due to communication difficulties and fatigue. Pt ended upright in w/c with quick release belt in place and soft call bell on half tray due to OT session beginning in 15 minutes. Continue per PT POC.   Therapy Documentation Precautions:  Precautions Precautions: Fall Precaution Comments: fall, NPO, pusher to the right at times, non-verbal,  Restrictions Weight Bearing Restrictions: No Pain: Pt does not indicate pain during PT session.   Function:  Toileting Toileting             Bed Mobility Roll left and right activity        Sit to lying activity        Lying to sitting activity        Mobility details     Transfers Sit to stand transfer   Sit to  stand assist level: Maximal assist (Pt 25 - 49%/lift and lower) Sit to stand assistive device: Armrests  Chair/bed transfer               Scientist, physiological device: Hand held assist (+2 HHA) Max distance: 50 Assist level: 2 helpers  Walk 10 feet activity   Assist level: 2 helpers  Walk 50 feet with 2 turns activity   Assist level: 2 helpers  Walk 150 feet activity      Walk 10 feet on uneven surfaces activity      Stairs          Walk up/down 1 step activity        Walk up/down 4 steps activity      Walk up/down 12 steps activity      Pick up small objects from floor      Wheelchair          Wheel 50 feet with 2 turns activity      Wheel 150 feet activity       Cognition Comprehension Comprehension assist level: Understands basic 25 - 49% of the time/ requires cueing 50 - 75% of the time  Expression Expression  assist level: Expresses basis less than 25% of the time/requires cueing >75% of the time.  Social Interaction Social Interaction assist level: Interacts appropriately 25 - 49% of time - Needs frequent redirection.  Problem Solving Problem solving assist level: Solves basic less than 25% of the time - needs direction nearly all the time or does not effectively solve problems and may need a restraint for safety  Memory Memory assist level: Recognizes or recalls less than 25% of the time/requires cueing greater than 75% of the time    Therapy/Group: Individual Therapy  Johncarlos Holtsclaw M 12/03/2014, 3:10 PM

## 2014-12-03 NOTE — Plan of Care (Signed)
Problem: RH BOWEL ELIMINATION Goal: RH STG MANAGE BOWEL WITH ASSISTANCE STG Manage Bowel with Mod.Assistance.  Outcome: Not Progressing Total assist. Incontinent

## 2014-12-03 NOTE — Progress Notes (Signed)
Physical Therapy Session Note  Patient Details  Name: [name] MRN: [id number] Date of Birth: 1955/08/16  Today's Date: 12/03/2014 PT Individual Time: 1000-1045 PT Individual Time Calculation (min): 45 min   Short Term Goals: Week 1:  PT Short Term Goal 1 (Week 1): Will perform bed mobility with modA and use of bedrails PT Short Term Goal 2 (Week 1): Will perform supine <>sit maxA +1 and mod cues PT Short Term Goal 3 (Week 1): Will perform stand pivot transfer modA PT Short Term Goal 4 (Week 1): Will perform ambulation x25' with LRAD and maxA +1 PT Short Term Goal 5 (Week 1): Will perform w/c propulsion x50' with modA  Skilled Therapeutic Interventions/Progress Updates:    Pt received seated in w/c, no c/o pain however pt unable to verbalize pain; see below for PAINAD scale. Pt on room air following respiratory treatment with [place] intact however not connected to wall outlet; RN notified. O2 measured at 84%; attached O2 at 2L and O2 remained <90%, increased to 4L with no change, increased to 6L and increased to 99-100%. Titrated back down to 2L and pt remained over 97% for remainder of session. During O2 adjustments, pt instructed in deep breathing exercises, however poor command following with little change in performance of breathing. Gait training x50' with two turns with +2 HHA. Increased difficulty with turning, requires increased weight shift facilitation for foot progression. Pt again cued for deep breathing following ambulation due to apparent pt anxiety with O2 WNL. Sit <>stand at parallel bars with UE support x 4 trials to improve pt activity tolerance, RLE weight bearing, postural control. Performed min/CGA overall with decreased cueing and assist needed with each rep. Pt returned to room and remained seated in w/c with QR belt intact and RN present; all needs within reach.  Therapy Documentation Precautions:  Precautions Precautions: Fall Precaution Comments: fall, NPO, pusher to  the right at times, non-verbal,  Restrictions Weight Bearing Restrictions: No Pain: Pain Assessment Pain Assessment: Faces Faces Pain Scale: No hurt PAINAD (Pain Assessment in Advanced Dementia) Breathing: occasional labored breathing, short period of hyperventilation Negative Vocalization: none Facial Expression: sad, frightened, frown Body Language: relaxed Consolability: no need to console PAINAD Score: 2   Function:    Transfers Sit to stand transfer   Sit to stand assist level: Moderate assist (Pt 50 - 74%/lift 2 legs/lift or lower) Sit to stand assistive device: Armrests  Chair/bed transfer               Scientist, physiological device: Hand held assist (+2 HHA) Max distance: 50 Assist level: 2 helpers  Walk 10 feet activity   Assist level: 2 helpers  Walk 50 feet with 2 turns activity   Assist level: 2 helpers  Walk 150 feet activity      Walk 10 feet on uneven surfaces activity      Stairs          Walk up/down 1 step activity        Walk up/down 4 steps activity      Walk up/down 12 steps activity      Pick up small objects from floor      Wheelchair          Wheel 50 feet with 2 turns activity  Wheel 150 feet activity        Therapy/Group: Individual Therapy  [name] 12/03/2014, 1:10 PM

## 2014-12-03 NOTE — Plan of Care (Signed)
Problem: RH KNOWLEDGE DEFICIT Goal: RH STG INCREASE KNOWLEDGE OF HYPERTENSION Pt's family education  Outcome: Not Progressing No family at bedside

## 2014-12-03 NOTE — Progress Notes (Signed)
Occupational Therapy Session Note  Patient Details  Name: [name] MRN: [id number] Date of Birth: 14-Dec-1955  Today's Date: 12/03/2014 OT Individual Time: 8295-6213 OT Individual Time Calculation (min): 57 min    Short Term Goals: Week 1:  OT Short Term Goal 1 (Week 1): Pt will maintain static sittting balance for 10 mins with no more than mod assist in preparation for selfcare tasks.   OT Short Term Goal 2 (Week 1): Pt will complete 2 grooming tasks with no more than mod demonstrational cueing for initiation and completion. OT Short Term Goal 3 (Week 1): Pt will perform UB bathing with mod assist sitting supported. OT Short Term Goal 4 (Week 1): Pt will use the RUE for washing face or UB with no more than mod assist. OT Short Term Goal 5 (Week 1): Pt will perform toilet transfer stand pivot with no more than mod assist.   Skilled Therapeutic Interventions/Progress Updates:    ADL retraining with focus on initiation and transfers.  Pt in bed upon arrival, incontinent of bowel.  Performed hygiene at bed level with focus on rolling and initiation, noted pt with improved initiation with rolling and movements of RUE with reaching for bed rail.  Initiation with bed mobility supine to sit.  +2 for dressing, max assist for sit > stand due to decreased initiation and fearfulness.  Attempted use of [employer] to assess scanning and initiation.  Utilized Mode A with no initiation to reach for illuminated light for 120 seconds.  2nd trial utilized tapping and repetitive cues to reach for light with still no initiation.   Therapy Documentation Precautions:  Precautions Precautions: Fall Precaution Comments: fall, NPO, pusher to the right at times, non-verbal,  Restrictions Weight Bearing Restrictions: No General:   Vital Signs: Therapy Vitals Temp: 97.9 F (36.6 C) Temp Source: Axillary Pulse Rate: 97 Resp: 18 BP: 112/69 mmHg Patient Position (if appropriate): Sitting Oxygen  Therapy SpO2: 96 % O2 Device: Nasal Cannula O2 Flow Rate (L/min): 3 L/min Pain: Pain Assessment Pain Assessment: Faces Faces Pain Scale: No hurt PAINAD (Pain Assessment in Advanced Dementia) Breathing: occasional labored breathing, short period of hyperventilation Negative Vocalization: none Facial Expression: sad, frightened, frown Body Language: relaxed Consolability: no need to console PAINAD Score: 2  Function:                                                       Upper Body Dressing/Undressing Upper body dressing   What is the patient wearing?: Pull over shirt/dress       Pull over shirt/dress - Perfomed by helper: Thread/unthread right sleeve;Thread/unthread left sleeve;Put head through opening;Pull shirt over trunk        Upper body assist Assist Level:  (Total assist)       Lower Body Dressing/Undressing Lower body dressing   What is the patient wearing?: Non-skid slipper socks;Pants       Pants- Performed by helper: Thread/unthread right pants leg;Thread/unthread left pants leg;Pull pants up/down   Non-skid slipper socks- Performed by helper: Don/doff right sock;Don/doff left sock                  Lower body assist Assist Level: 2 Helpers        Transfers Sit to stand transfer   Sit to stand assist level: Maximal assist (Pt 25 -  49%/lift and lower) Sit to stand assistive device: Armrests  Chair/bed transfer              Toilet transfer                Tub/shower transfer             Cognition Comprehension Comprehension assist level: Understands basic 25 - 49% of the time/ requires cueing 50 - 75% of the time  Expression Expression assist level: Expresses basis less than 25% of the time/requires cueing >75% of the time.  Social Interaction Social Interaction assist level: Interacts appropriately 25 - 49% of time - Needs frequent redirection.  Problem Solving Problem solving assist level: Solves basic less than 25% of the time  - needs direction nearly all the time or does not effectively solve problems and may need a restraint for safety  Memory Memory assist level: Recognizes or recalls less than 25% of the time/requires cueing greater than 75% of the time    Therapy/Group: Individual Therapy  Rosalio Loud 12/03/2014, 3:53 PM

## 2014-12-03 NOTE — Consult Note (Signed)
WOC wound consult note Reason for Consult: buttock wound Wound type: ulceration due to incontinence.  Skin has been clear, however lately she has been having loose stools and she is incontinent of bowel and bladder Pressure Ulcer POA:No Measurement:aprox 2cm x 1.0cm x 0.1cm unable to measure with ruler due to patient standing over toilet  Wound bed: pink, moist Drainage (amount, consistency, odor) oozing some sanguinous drainage.  Periwound: intact  Dressing procedure/placement/frequency: No real topical dressings will stay in place due to location.  Continue barrier cream and monitor for changes. No briefs while patient in bed.  Discussed POC with patient and bedside nurse.  Re consult if needed, will not follow at this time. Thanks  Riaz Onorato [employer], CWOCN [phone])

## 2014-12-04 ENCOUNTER — Inpatient Hospital Stay (HOSPITAL_COMMUNITY): Payer: Medicaid Other | Admitting: Occupational Therapy

## 2014-12-04 ENCOUNTER — Inpatient Hospital Stay (HOSPITAL_COMMUNITY): Payer: Medicaid Other | Admitting: Speech Pathology

## 2014-12-04 ENCOUNTER — Inpatient Hospital Stay (HOSPITAL_COMMUNITY): Payer: Medicaid Other | Admitting: Physical Therapy

## 2014-12-04 LAB — CBC
HCT: 30.3 % — ABNORMAL LOW (ref 36.0–46.0)
Hemoglobin: 9.5 g/dL — ABNORMAL LOW (ref 12.0–15.0)
MCH: 27.2 pg (ref 26.0–34.0)
MCHC: 31.4 g/dL (ref 30.0–36.0)
MCV: 86.8 fL (ref 78.0–100.0)
PLATELETS: 321 10*3/uL (ref 150–400)
RBC: 3.49 MIL/uL — AB (ref 3.87–5.11)
RDW: 18.5 % — ABNORMAL HIGH (ref 11.5–15.5)
WBC: 8.2 10*3/uL (ref 4.0–10.5)

## 2014-12-04 LAB — GLUCOSE, CAPILLARY
GLUCOSE-CAPILLARY: 85 mg/dL (ref 65–99)
GLUCOSE-CAPILLARY: 84 mg/dL (ref 65–99)
GLUCOSE-CAPILLARY: 91 mg/dL (ref 65–99)
GLUCOSE-CAPILLARY: 89 mg/dL (ref 65–99)

## 2014-12-04 LAB — RENAL FUNCTION PANEL
Albumin: 2.5 g/dL — ABNORMAL LOW (ref 3.5–5.0)
Anion gap: 13 (ref 5–15)
BUN: 57 mg/dL — AB (ref 6–20)
CALCIUM: 9 mg/dL (ref 8.9–10.3)
CHLORIDE: 92 mmol/L — AB (ref 101–111)
CO2: 29 mmol/L (ref 22–32)
CREATININE: 6.93 mg/dL — AB (ref 0.44–1.00)
GFR calc non Af Amer: 6 mL/min — ABNORMAL LOW (ref >60)
GFR, EST AFRICAN AMERICAN: 7 mL/min — AB (ref >60)
GLUCOSE: 93 mg/dL (ref 65–99)
Phosphorus: 4.3 mg/dL (ref 2.5–4.6)
Potassium: 4.1 mmol/L (ref 3.5–5.1)
SODIUM: 134 mmol/L — AB (ref 135–145)

## 2014-12-04 MED ORDER — LIDOCAINE HCL (PF) 1 % IJ SOLN: 5.0000 mL | INTRAMUSCULAR | Status: DC | PRN

## 2014-12-04 MED ORDER — SODIUM CHLORIDE 0.9 % IV SOLN: 100.0000 mL | INTRAVENOUS | Status: DC | PRN

## 2014-12-04 MED ORDER — LIDOCAINE-PRILOCAINE 2.5-2.5 % EX CREA: 1.0000 "application " | TOPICAL_CREAM | CUTANEOUS | Status: DC | PRN

## 2014-12-04 MED ORDER — ALTEPLASE 2 MG IJ SOLR: 2.0000 mg | Freq: Once | INTRAMUSCULAR | Status: DC | PRN

## 2014-12-04 MED ORDER — HEPARIN SODIUM (PORCINE) 1000 UNIT/ML DIALYSIS: 1000.0000 [IU] | INTRAMUSCULAR | Status: DC | PRN

## 2014-12-04 MED ORDER — PENTAFLUOROPROP-TETRAFLUOROETH EX AERO: 1.0000 "application " | INHALATION_SPRAY | CUTANEOUS | Status: DC | PRN

## 2014-12-04 NOTE — Plan of Care (Signed)
Problem: RH KNOWLEDGE DEFICIT Goal: RH STG INCREASE KNOWLEDGE OF HYPERTENSION Pt's family education  Outcome: Not Progressing Unable to assess patient nonverbal no family present Goal: RH STG INCREASE KNOWLEDGE OF DYSPHAGIA/FLUID INTAKE Pt's family education  Outcome: Not Progressing No family present

## 2014-12-04 NOTE — Progress Notes (Signed)
Speech Language Pathology Daily Session Note  Patient Details  Name: [name] MRN: [id number] Date of Birth: 1955/12/01  Today's Date: 12/04/2014 SLP Individual Time: 1000-1100 SLP Individual Time Calculation (min): 60 min  Short Term Goals: Week 2: SLP Short Term Goal 1 (Week 2): Pt will improve management of her secretions as evidenced by ability to orally expectorate secretions and anteriorly contain her saliva with max assist multimodal cues.   SLP Short Term Goal 2 (Week 2): Pt will consume PO trials with max assist multimodal cues to clear boluses from the oral cavity and to initiate swallow in a timely manner over 3 consecutive sessions prior to completion of objective swallow study.  SLP Short Term Goal 3 (Week 2): Pt will initiate a basic,familiar task with max assist multimodal cues over 75% of observable opportunities.  SLP Short Term Goal 4 (Week 2): Pt will visually scan to midline in >25% of observable opportunities during basic, functional tasks with max assist multimodal cues.  SLP Short Term Goal 5 (Week 2): Pt will vocalize to produce vowels in isolation with max assist multimodal cues.    Skilled Therapeutic Interventions:  Pt was seen for skilled ST targeting goals for cognition and dysphagia.  Upon arrival, pt was seated upright in recliner, awake, flat affect, but would turn her head to voice in response to command.  Pt indicated via head nod that she was in pain and she pointed to her abdomen.  SLP informed RN who administered medications.  Pt completed oral care with hand over hand faded to max assist verbal and tactile cues for initiation.  Pt sustained attention to abovementioned basic self care task for 10-15 seconds with max assist multimodal cues for redirection.  Pt remains nonverbal with limited attempts to initiate functional communication and even requires max assist verbal cues to maintain eye contact with therapist on pt's LEFT/intact side.  SLP facilitated the  session with trials of ice chips to continue working towards initiation of PO diet.  Pt continues to present with suspected delayed swallow initiation, oral holding, and immediate, wet congested cough on 3 out of 3 trials.  Do not recommend POs at this time.  Pt was left upright in wheelchair with quick release belt donned and call bell within reach.  Continue per current plan of care.    Function:  Eating Eating     Eating Assist Level: Hand over hand assist;Help managing cup/glass;Helper brings food to mouth (with trials )       Helper Brings Food to Mouth: Every scoop   Cognition Comprehension Comprehension assist level: Understands basic 25 - 49% of the time/ requires cueing 50 - 75% of the time  Expression   Expression assist level: Expresses basis less than 25% of the time/requires cueing >75% of the time.  Social Interaction Social Interaction assist level: Interacts appropriately 25 - 49% of time - Needs frequent redirection.  Problem Solving Problem solving assist level: Solves basic less than 25% of the time - needs direction nearly all the time or does not effectively solve problems and may need a restraint for safety  Memory Memory assist level: Recognizes or recalls less than 25% of the time/requires cueing greater than 75% of the time    Pain Pain Assessment Pain Assessment: Faces Faces Pain Scale: Hurts a little bit Pain Type: Acute pain Pain Location: Abdomen Pain Descriptors / Indicators: Restless Pain Intervention(s): RN made aware   Therapy/Group: Individual Therapy  Kylin Dubs, Melanee Spry 12/04/2014, 4:20 PM

## 2014-12-04 NOTE — Progress Notes (Signed)
Occupational Therapy Weekly Progress Note  Patient Details  Name: [name] MRN: [id number] Date of Birth: 1955/06/01  Beginning of progress report period: November 26, 2014 End of progress report period: December 04, 2014  Today's Date: 12/04/2014 OT Individual Time: 6886-4847 OT Individual Time Calculation (min): 60 min    Patient has met 1 of 5 short term goals.  Pt is making slow progress towards goals.  She requires total assist to complete basic, familiar tasks due to decreased focused attention to tasks, right inattention, decreased initiation, and motor apraxia.  Pt currently requires max cues for initiation and hand over hand assist to initiate familiar self-care tasks.  Pt demonstrates improved initiation intermittently, but often requires cues or redirection to complete task.  Pt currently max assist +2 for transfers due to decreased initiation and fluctuations in participation. Pt continues to be nonverbal, however has been able to communicate yes/no with head nods when able to obtain eye contact with pt.    Patient continues to demonstrate the following deficits: Rt inattention, decreased initiation, motor apraxia, decreased attention to task, RUE weakness, impaired dynamic sitting and standing balance and therefore will continue to benefit from skilled OT intervention to enhance overall performance with BADL and Reduce care partner burden.  Patient progressing toward long term goals..  Continue plan of care.  OT Short Term Goals Week 1:  OT Short Term Goal 1 (Week 1): Pt will maintain static sittting balance for 10 mins with no more than mod assist in preparation for selfcare tasks.   OT Short Term Goal 1 - Progress (Week 1): Met OT Short Term Goal 2 (Week 1): Pt will complete 2 grooming tasks with no more than mod demonstrational cueing for initiation and completion. OT Short Term Goal 2 - Progress (Week 1): Progressing toward goal OT Short Term Goal 3 (Week 1): Pt will  perform UB bathing with mod assist sitting supported. OT Short Term Goal 3 - Progress (Week 1): Progressing toward goal OT Short Term Goal 4 (Week 1): Pt will use the RUE for washing face or UB with no more than mod assist. OT Short Term Goal 4 - Progress (Week 1): Progressing toward goal OT Short Term Goal 5 (Week 1): Pt will perform toilet transfer stand pivot with no more than mod assist.  OT Short Term Goal 5 - Progress (Week 1): Progressing toward goal Week 2:  OT Short Term Goal 1 (Week 2): Pt will complete 2 grooming tasks with no more than mod demonstrational cueing for initiation and completion. OT Short Term Goal 2 (Week 2): Pt will perform UB bathing with mod assist sitting supported. OT Short Term Goal 3 (Week 2): Pt will use the RUE for washing face or UB with no more than mod assist. OT Short Term Goal 4 (Week 2): Pt will perform toilet transfer stand pivot with no more than mod assist.   Skilled Therapeutic Interventions/Progress Updates:    ADL retraining with focus on initiation and increased participation in self-care tasks.  Pt demonstrated improved initiation with rolling in bed and coming to sit EOB, still requiring mod assist for weight shift to come to EOB.  Pt maintained static sitting balance at EOB while therapist assisted with bathing and dressing.  Pt attempted to don shirt this session with placing both arms into shirt as therapist placed it on her lap, but unable to fully thread either sleeve.  Pt also pulled shirt over head with hand over hand assist this session.  Applied deodorant with hand over hand assist.  Sit > stand max assist throughout majority of session, however one instance when standing to pull up pants only a light mod assist.  Stand step/pivot transfer to Rt to w/c with pt reaching for arm rest with RUE.  Grooming completed seated in w/c at sink with focus on initiation and functional use of RUE.  Pt washed face with LUE after hand over hand to initiate task.   Pt placed hairbrush in Rt hand and lifted hand but unable to bring brush all the way to hair and unable to complete task, despite cues and attempts at hand over hand.    Therapy Documentation Precautions:  Precautions Precautions: Fall Precaution Comments: fall, NPO, pusher to the right at times, non-verbal,  Restrictions Weight Bearing Restrictions: No Pain:  Pt with no c/o pain  Function:   Grooming Oral Care,Brush Teeth, Clean Dentures Activity:      Assist Level: Total assist (Pt < 25%)      Wash, Rinse, Dry Face Activity   Assist Level: Supervision or verbal cues;Set up;Touching or steadying assistance(Pt > 75%) (hand over hand to initiate task, pt able to complete without assist)   Set up : To obtain items;To open containers  Wash, Rinse, Dry Hands Activity   Assist Level: Helper performed activity      Brush, Comb Hair Activity   Assist Level: Helper performed activity    Shave Activity          Apply Makeup Activity                                                             Bathing Bathing position   Position: Sitting EOB  Bathing parts   Body parts bathed by helper: Right arm;Left arm;Chest;Abdomen;Front perineal area;Buttocks;Right upper leg;Left upper leg;Right lower leg;Left lower leg;Back  Bathing assist Assist Level: 2 helpers       Upper Body Dressing/Undressing Upper body dressing   What is the patient wearing?: Pull over shirt/dress       Pull over shirt/dress - Perfomed by helper: Thread/unthread right sleeve;Put head through opening;Pull shirt over trunk;Thread/unthread left sleeve        Upper body assist Assist Level: Set up;Supervision or verbal cues (Total assist)   Set up : To obtain clothing/put away   Lower Body Dressing/Undressing Lower body dressing   What is the patient wearing?: Non-skid slipper socks;Pants       Pants- Performed by helper: Thread/unthread right pants leg;Thread/unthread left pants leg;Pull pants  up/down   Non-skid slipper socks- Performed by helper: Don/doff right sock;Don/doff left sock                  Lower body assist Assist Level: 2 Helpers       Toileting Toileting Toileting activity did not occur: No continent bowel/bladder event        Toileting assist      Bed Mobility Roll left and right activity   Assist level: Mod assist (Pt 50 - 74%, lift 2 legs) Roll left and right assistive device: Bedrails  Sit to lying activity        Lying to sitting activity   Assist level: Moderate assist (Pt 50 - 74%, lift 2 legs) Lying to sitting assistive device: HOB  elevated;Bedrails  Mobility details Bed mobility details: Tactile cues for initiation;Tactile cues for weight shifting;Visual cues/gestures for sequencing;Manual facilitation for weight shifting    Transfers Sit to stand transfer   Sit to stand assist level: Maximal assist (Pt 25 - 49%/lift and lower) Sit to stand assistive device: Bedrails;Armrests  Chair/bed transfer   Chair/bed transfer method: Stand pivot Chair/bed transfer assist level: Maximal assist (Pt 25 - 49%/lift and lower) Chair/bed transfer assistive device: Armrests;Bedrails   Chair/bed transfer details: Visual cues/gestures for sequencing;Manual facilitation for weight shifting;Manual facilitation for placement;Tactile cues for weight shifting  Toilet transfer                Tub/shower transfer Tub/shower transfer activity did not occur: Safety/medical concerns           Cognition Comprehension Comprehension assist level: Understands basic 25 - 49% of the time/ requires cueing 50 - 75% of the time  Expression Expression assist level: Expresses basis less than 25% of the time/requires cueing >75% of the time.  Social Interaction Social Interaction assist level: Interacts appropriately 25 - 49% of time - Needs frequent redirection.  Problem Solving Problem solving assist level: Solves basic less than 25% of the time - needs  direction nearly all the time or does not effectively solve problems and may need a restraint for safety  Memory Memory assist level: Recognizes or recalls less than 25% of the time/requires cueing greater than 75% of the time    Therapy/Group: Individual Therapy  Simonne Come 12/04/2014, 10:33 AM

## 2014-12-04 NOTE — Procedures (Signed)
I have seen and examined this patient and agree with the plan of care  K  4.7   Hb 9.6  [name] W 12/04/2014, 6:19 PM

## 2014-12-04 NOTE — Progress Notes (Signed)
Physical Therapy Weekly Progress Note  Patient Details  Name: [name] MRN: [id number] Date of Birth: 11-Oct-1955  Beginning of progress report period: November 26, 2014 End of progress report period: December 04, 2014  Today's Date: 12/04/2014 PT Individual Time: 1100-1155 PT Individual Time Calculation (min): 55 min   Patient has met 3 of 5 short term goals.  Unmet goals include gait x25' with maxA +1; pt ambulating as much as 150' however requires +2 assist due to pt hesitancy and apraxia, as well as w/c follow for safety and line/lead management. W/c propulsion has not been a major focus of therapy sessions due to pt apraxia, decreased RUE coordination. Pt is limited overall by communication deficits, decreased initiation, and apraxia as well as emotional lability and decreased motivation.  Patient continues to demonstrate the following deficits: R inattention, decreased UE/LE coordination, decreased activity tolerance, balance impairments, strength deficits, and therefore will continue to benefit from skilled PT intervention to enhance overall performance with activity tolerance, balance, postural control, ability to compensate for deficits, functional use of  right upper extremity and right lower extremity, attention, awareness and coordination.  Patient progressing toward long term goals..  Continue plan of care.  PT Short Term Goals Week 1:  PT Short Term Goal 1 (Week 1): Will perform bed mobility with modA and use of bedrails PT Short Term Goal 1 - Progress (Week 1): Met PT Short Term Goal 2 (Week 1): Will perform supine <>sit maxA +1 and mod cues PT Short Term Goal 2 - Progress (Week 1): Met PT Short Term Goal 3 (Week 1): Will perform stand pivot transfer modA PT Short Term Goal 3 - Progress (Week 1): Met PT Short Term Goal 4 (Week 1): Will perform ambulation x25' with LRAD and maxA +1 PT Short Term Goal 4 - Progress (Week 1): Not met PT Short Term Goal 5 (Week 1): Will perform  w/c propulsion x50' with modA PT Short Term Goal 5 - Progress (Week 1): Not met Week 2:  PT Short Term Goal 1 (Week 2): Pt will perform bed mobility minA and min cues, with bedrails PT Short Term Goal 2 (Week 2): Will perform stand pivot transfer w/c <> bed with consistent minA and min cues PT Short Term Goal 3 (Week 2): Pt will perform ambulation x50' and modA +1 PT Short Term Goal 4 (Week 2): Pt will perform ascent/descent of 8 3-inch stairs with maxA +1 PT Short Term Goal 5 (Week 2): Pt will perform w/c propulsion x50' with modA and max verbal cues  Skilled Therapeutic Interventions/Progress Updates:    Pt received seated in w/c; signs of pain or discomfort as described below. Pt appears hesitant to agree to treatment; pulls blanket back onto her lap when therapist attempts to remove. Questioned pt with simple yes/no questions regarding pain, discomfort, sadness, however no pt response. Explained importance of therapy to improve strength, encouraged to participate and pt shakes head yes. Sit <>stand x 3 reps at parallel bars while switching pt to tilt-in-space w/c and pt educated on use of tilt for pressure relief due to skin breakdown noted by nursing. Standing at parallel bars x5 reps of 2-3 min while pt encouraged to reach for colored cups; requires minA to initiate task, however once begun pt grabs and moves all cups to new location, however not to where therapist has directed pt to put cups. Use of block between feet to prevent adduction and narrow BOS. Pt returned to room and remained seated in  w/c tilted back; RN and NA alerted to new chair and standard w/c left in room for future use if pt appears uncomfortable in new chair. All needs within reach and NA present at completion of session.   Therapy Documentation Precautions:  Precautions Precautions: Fall Precaution Comments: fall, NPO, pusher to the right at times, non-verbal,  Restrictions Weight Bearing Restrictions: No Pain: PAINAD  (Pain Assessment in Advanced Dementia) Breathing: occasional labored breathing, short period of hyperventilation Negative Vocalization: none Facial Expression: sad, frightened, frown Body Language: relaxed Consolability: no need to console PAINAD Score: 2   Function:   Toileting Toileting Toileting activity did not occur: No continent bowel/bladder event           Bed Mobility Roll left and right activity   Assist level: Mod assist (Pt 50 - 74%, lift 2 legs) Roll left and right assistive device: Bedrails  Sit to lying activity        Lying to sitting activity   Assist level: Moderate assist (Pt 50 - 74%, lift 2 legs) Lying to sitting assistive device: HOB elevated;Bedrails  Mobility details Bed mobility details: Tactile cues for initiation;Tactile cues for weight shifting;Visual cues/gestures for sequencing;Manual facilitation for weight shifting   Transfers Sit to stand transfer   Sit to stand assist level: Moderate assist (Pt 50 - 74%/lift 2 legs/lift or lower) Sit to stand assistive device: Other (parallel bars)  Chair/bed transfer    Toilet transfer                Car transfer         Therapy/Group: Individual Therapy  Luberta Mutter 12/04/2014, 12:22 PM

## 2014-12-04 NOTE — Progress Notes (Signed)
Subjective:  Just finishing PT back in room. Not as interactive as before  Objective Vital signs in last 24 hours: Filed Vitals:   12/03/14 1512 12/03/14 2036 12/04/14 0327 12/04/14 0500  BP:    109/72  Pulse:    95  Temp:    97.9 F (36.6 C)  TempSrc:    Oral  Resp:    16  Weight:    74.4 kg (164 lb 0.4 oz)  SpO2: 96% 95% 96% 98%   Weight change: -1.5 kg (-3 lb 4.9 oz)  Physical Exam General: sitting in WC alert, not as interactive today  Heart: RRR ,  Lungs: dim BS - poor resp effort ,nonlabored breathing Abdomen: soft , nt, nd, GT in place Extremities: no sig pedal  edema Dialysis Access: right IJ and left AVF + bruit  Dialysis Orders: AF 4h 91kg 2/2 bath Hep none Hect 2 ug Aranesp 200 /wk + venofer 50/wk3  Problem/Plan 1. 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 ESRD - TTS, using AVF - still has IJ - if works ok today, can get perm  cath out  3. Anemia of CKD-Hgb 9.6- Aranesp 100 q weekly hd - if Hgb stays over 11, will hold; last tsat was 13% 7/30 and Fe not repleted - check Fe levels 4. Secondary hyperparathyroidism - corr Ca 10.5. Phos 4.4 - Hectorol on hold -on  fosrenol 500 tid/ using 2.0 ca bath 5. HTN/volume - ok; below edw from outpt. 6. Nutrition - nepro at 60 /hr alb 2.4>2.6  Tube feeding  An drenal vitamin    Lenny Pastel, PA-C [facility] [phone] 12/04/2014,1:31 PM  LOS: 9 days   Labs: Basic Metabolic Panel:  Recent Labs Lab 11/28/14 1851 12/01/14 2303 12/02/14 1857  NA 136  --  134*  K 4.0 4.2 4.7  CL 97*  --  92*  CO2 28  --  27  GLUCOSE 115*  --  80  BUN 51*  --  76*  CREATININE 6.29*  --  7.92*  CALCIUM 8.9  --  9.4  PHOS 3.7  --  4.4   Liver Function Tests:  Recent Labs Lab 11/28/14 1851 12/02/14 1857  ALBUMIN 2.4* 2.6*   No results for input(s): LIPASE, AMYLASE in the last 168 hours. No results for input(s): AMMONIA in the last 168 hours. CBC:  Recent Labs Lab 11/28/14 1851  12/02/14 1901  WBC 8.2 9.2  HGB 10.2* 9.6*  HCT 32.9* 30.6*  MCV 88.7 85.2  PLT 341 324   Cardiac Enzymes: No results for input(s): CKTOTAL, CKMB, CKMBINDEX, TROPONINI in the last 168 hours. CBG:  Recent Labs Lab 12/03/14 1202 12/03/14 1852 12/03/14 2341 12/04/14 0554 12/04/14 1155  GLUCAP 93 75 90 85 84    Studies/Results: No results found. Medications: . feeding supplement (NEPRO CARB STEADY) 1,000 mL (12/03/14 2337)   . ALPRAZolam  0.5 mg Per Tube Q T,Th,Sa-HD  . antiseptic oral rinse  7 mL Mouth Rinse QID  . aspirin  325 mg Oral Daily  . budesonide  0.25 mg Nebulization BID  . chlorhexidine  15 mL Mouth/Throat BID  . [START ON 12/08/2014] darbepoetin (ARANESP) injection - DIALYSIS  100 mcg Intravenous Q Tue-HD  . free water  50 mL Per Tube BID  . guaifenesin  200 mg Per Tube TID  . ipratropium-albuterol  3 mL Nebulization Q6H  . lanthanum  500 mg Per Tube TID  . levETIRAcetam  500 mg Oral BID  .  levothyroxine  25 mcg Oral QAC breakfast  . mirtazapine  7.5 mg Per Tube QHS  . multivitamin  1 tablet Oral QHS  . pantoprazole sodium  40 mg Per Tube Q1200

## 2014-12-04 NOTE — Plan of Care (Signed)
Problem: RH SKIN INTEGRITY Goal: RH STG SKIN FREE OF INFECTION/BREAKDOWN With Mod. Assisst  Outcome: Not Progressing periarea noted with MASD.  Goal: RH STG MAINTAIN SKIN INTEGRITY WITH ASSISTANCE STG Maintain Skin Integrity With Mod. Assistance.  Outcome: Not Progressing Requires total assist

## 2014-12-04 NOTE — Progress Notes (Signed)
Subjective/Complaints: Patient remains globally aphasic. In bed opens eyes to command. No evidence of respiratory distress   ROS cannot obtain due to global aphasia   Objective: Vital Signs: Blood pressure 109/72, pulse 95, temperature 97.9 F (36.6 C), temperature source Oral, resp. rate 16, weight 74.4 kg (164 lb 0.4 oz), SpO2 98 %. No results found. Results for orders placed or performed during the hospital encounter of 11/25/14 (from the past 72 hour(s))  Glucose, capillary     Status: Abnormal   Collection Time: 12/01/14 11:23 AM  Result Value Ref Range   Glucose-Capillary 114 (H) 65 - 99 mg/dL  Glucose, capillary     Status: None   Collection Time: 12/01/14  5:48 PM  Result Value Ref Range   Glucose-Capillary 95 65 - 99 mg/dL  Potassium     Status: None   Collection Time: 12/01/14 11:03 PM  Result Value Ref Range   Potassium 4.2 3.5 - 5.1 mmol/L  Glucose, capillary     Status: None   Collection Time: 12/02/14 12:24 AM  Result Value Ref Range   Glucose-Capillary 86 65 - 99 mg/dL  Glucose, capillary     Status: None   Collection Time: 12/02/14  6:01 AM  Result Value Ref Range   Glucose-Capillary 94 65 - 99 mg/dL  Glucose, capillary     Status: Abnormal   Collection Time: 12/02/14 12:01 PM  Result Value Ref Range   Glucose-Capillary 105 (H) 65 - 99 mg/dL  Renal function panel     Status: Abnormal   Collection Time: 12/02/14  6:57 PM  Result Value Ref Range   Sodium 134 (L) 135 - 145 mmol/L   Potassium 4.7 3.5 - 5.1 mmol/L   Chloride 92 (L) 101 - 111 mmol/L   CO2 27 22 - 32 mmol/L   Glucose, Bld 80 65 - 99 mg/dL   BUN 76 (H) 6 - 20 mg/dL   Creatinine, Ser 7.49 (H) 0.44 - 1.00 mg/dL   Calcium 9.4 8.9 - 41.7 mg/dL   Phosphorus 4.4 2.5 - 4.6 mg/dL   Albumin 2.6 (L) 3.5 - 5.0 g/dL   GFR calc non Af Amer 5 (L) >60 mL/min   GFR calc Af Amer 6 (L) >60 mL/min    Comment: (NOTE) The eGFR has been calculated using the CKD EPI equation. This calculation has not been  validated in all clinical situations. eGFR's persistently <60 mL/min signify possible Chronic Kidney Disease.    Anion gap 15 5 - 15  CBC     Status: Abnormal   Collection Time: 12/02/14  7:01 PM  Result Value Ref Range   WBC 9.2 4.0 - 10.5 K/uL   RBC 3.59 (L) 3.87 - 5.11 MIL/uL   Hemoglobin 9.6 (L) 12.0 - 15.0 g/dL   HCT 08.6 (L) 33.4 - 54.4 %   MCV 85.2 78.0 - 100.0 fL   MCH 26.7 26.0 - 34.0 pg   MCHC 31.4 30.0 - 36.0 g/dL   RDW 49.9 (H) 82.1 - 67.0 %   Platelets 324 150 - 400 K/uL  Glucose, capillary     Status: Abnormal   Collection Time: 12/02/14  8:57 PM  Result Value Ref Range   Glucose-Capillary 113 (H) 65 - 99 mg/dL  Glucose, capillary     Status: Abnormal   Collection Time: 12/02/14 11:55 PM  Result Value Ref Range   Glucose-Capillary 137 (H) 65 - 99 mg/dL  Glucose, capillary     Status: None   Collection Time:  12/03/14  6:06 AM  Result Value Ref Range   Glucose-Capillary 97 65 - 99 mg/dL  Glucose, capillary     Status: None   Collection Time: 12/03/14 12:02 PM  Result Value Ref Range   Glucose-Capillary 93 65 - 99 mg/dL   Comment 1 Notify RN   Glucose, capillary     Status: None   Collection Time: 12/03/14  6:52 PM  Result Value Ref Range   Glucose-Capillary 75 65 - 99 mg/dL   Comment 1 Notify RN   Glucose, capillary     Status: None   Collection Time: 12/03/14 11:41 PM  Result Value Ref Range   Glucose-Capillary 90 65 - 99 mg/dL  Glucose, capillary     Status: None   Collection Time: 12/04/14  5:54 AM  Result Value Ref Range   Glucose-Capillary 85 65 - 99 mg/dL     HEENT: poor oral movements  facial diplegia, cannot open mouth to command Cardio: RRR and no murmur Resp: CTA B/L.no use of accesory muscles, exp wheezes absent but upper airway sounds, not able to cough on command GI: BS positive and NT, ND, pior miline abd incision with extensive scarring Extremity:  Pulses positive and No Edema Skin:   Other G tube site NT Neuro: Flat but very alert,  Cranial Nerve Abnormalities bilateral central 7,Aphasic and Apraxic Musc/Skel:  Other decrease ROM in BIlateral knees and Right shoulder and elbow due to tone Gen NAD   Assessment/Plan: 1. 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 Physiatrist is providing close team supervision and 24 hour management of active medical problems listed below. Physiatrist and rehab team continue to assess barriers to discharge/monitor patient progress toward functional and medical goals.   FIM: Function - Bathing Position: Wheelchair/chair at sink Body parts bathed by patient: Right upper leg, Left upper leg Body parts bathed by helper: Right arm, Left arm, Chest, Buttocks (completed UB bathing only) Bathing not applicable: Abdomen, Front perineal area, Buttocks, Right upper leg, Left upper leg, Right lower leg, Left lower leg Assist Level: 2 helpers  Function- Upper Body Dressing/Undressing What is the patient wearing?: Pull over shirt/dress Pull over shirt/dress - Perfomed by helper: Thread/unthread right sleeve, Thread/unthread left sleeve, Put head through opening, Pull shirt over trunk Assist Level:  (Total assist) Function - Lower Body Dressing/Undressing What is the patient wearing?: Non-skid slipper socks, Pants Position: Wheelchair/chair at sink Pants- Performed by helper: Thread/unthread right pants leg, Thread/unthread left pants leg, Pull pants up/down Non-skid slipper socks- Performed by helper: Don/doff right sock, Don/doff left sock Assist Level: 2 Helpers  Function - Toileting Toileting activity did not occur: No continent bowel/bladder event Toileting steps completed by helper: Adjust clothing prior to toileting, Adjust clothing after toileting, Performs perineal  hygiene Toileting Assistive Devices: Other (comment) (UE support on sink) Assist level: Two helpers  Function - Air cabin crew transfer activity did not occur: Safety/medical concerns Toilet transfer assistive device: Drop arm commode Assist level to toilet: 2 helpers Assist level from toilet: 2 helpers  Function - Chair/bed transfer Chair/bed transfer method: Stand pivot Chair/bed transfer assist level: Maximal assist (Pt 25 - 49%/lift and lower) Chair/bed transfer assistive device: Armrests, Bedrails Chair/bed transfer details: Visual cues/gestures for sequencing, Manual facilitation for weight shifting, Manual facilitation for weight bearing, Manual facilitation for placement  Function - Locomotion:  Wheelchair Will patient use wheelchair at discharge?: Yes Wheelchair activity did not occur:  (Attempted hand-over-hand facilitation for w/c propulsion, visual demonstration, however no pt initiation evident) Assist Level: Dependent (Pt equals 0%) Function - Locomotion: Ambulation Ambulation activity did not occur: Safety/medical concerns (Attempted ambulation, however upon standing x3 reps pt unable to step (unable to determine if due to$Remove' \\apraxia'PjpLLHl$  or pt fearful)) Assistive device: Hand held assist (+2 HHA) Max distance: 50 Assist level: 2 helpers Walk 10 feet activity did not occur: Safety/medical concerns Assist level: 2 helpers Walk 50 feet with 2 turns activity did not occur: Safety/medical concerns Assist level: 2 helpers Walk 150 feet activity did not occur: Safety/medical concerns Assist level: 2 helpers Walk 10 feet on uneven surfaces activity did not occur: Safety/medical concerns  Function - Comprehension Comprehension: Auditory Comprehension assist level: Understands basic 25 - 49% of the time/ requires cueing 50 - 75% of the time  Function - Expression Expression: Nonverbal Expression assist level: Expresses basis less than 25% of the time/requires cueing >75%  of the time.  Function - Social Interaction Social Interaction assist level: Interacts appropriately 25 - 49% of time - Needs frequent redirection.  Function - Problem Solving Problem solving assist level: Solves basic less than 25% of the time - needs direction nearly all the time or does not effectively solve problems and may need a restraint for safety  Function - Memory Memory assist level: Recognizes or recalls less than 25% of the time/requires cueing greater than 75% of the time Patient normally able to recall (first 3 days only): None of the above  Medical Problem List and Plan: 1. Functional deficits secondary to left MCA infarct/respiratory failure. Extubated 11/20/2014. RR tends to be elevated but sats ok, wheezing will schedule nebs 2. DVT Prophylaxis/Anticoagulation: SCDs. Monitor for any signs of DVT. 8/12 venous dopplers neg 3. Pain Management: Tylenol as needed, no apparent pain during PT 4. Dysphagia. Patient is nothing by mouth. Status post gastrostomy tube 08/12/2014 per interventional radiology for nutritional support. poor prognosis for improvement given severe bilateral deficits, do not anticipate d/c PEG during this admission, discussed with SLP not to spend much time with swallow   5. Neuropsych: This patient is not capable of making decisions on her own behalf. 6. Skin/Wound Care: Routine skin checks. PEG site maintenance. continue turning and nutrition 7. Fluids/Electrolytes/Nutrition: Routine I&O. Adjust TF based on nutritional needs. Renal adjusting dialysis, complicated situation, Not a diabetic, getting CBGs because of TF, these have been normal 8. End-stage renal disease with hemodialysis. Follow-up per renal services. Hemodialysis in PM after therapies to avoid conflict, Nepro for nutrition, last K was normal at 4.7 9. Seizure prophylaxis. Keppra 500 mg twice a day, monitor drug toxicity. No seizures, continue current dose no evidence of excessive sedation 10.  Hypothyroidism. Synthroid 11. Chronic anemia. Aranesp weekly with dialysis, hgb monitored in HD 12. History of asthma. Schedule nebulizers given aphasia. Anxiety component but this aspect is improving 13. Mood/anxiety. Continue Xanax on dialysis days 14.  Hx of recurrent resp failure, high risk for PNA given comorbid conditions,  RR increases at times. Comfortable. Secretions better. Afebrile. No distress.  -continue to aggressively monitor, aspiration precautions,  -wean oxygen down to keep sats >90% 15.  Bowel incont- monitor skin 16.  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

## 2014-12-04 NOTE — Plan of Care (Signed)
Problem: RH BOWEL ELIMINATION Goal: RH STG MANAGE BOWEL WITH ASSISTANCE STG Manage Bowel with Mod.Assistance.  Outcome: Not Progressing Patient total assist incontinent .

## 2014-12-04 NOTE — Progress Notes (Signed)
Occupational Therapy Session Note  Patient Details  Name: [name] MRN: [id number] Date of Birth: 19-Aug-1955  Today's Date: 12/04/2014 OT Individual Time:  -   1530-1600  (60 min)      Short Term Goals: Week 1:  OT Short Term Goal 1 (Week 1): Pt will maintain static sittting balance for 10 mins with no more than mod assist in preparation for selfcare tasks.   OT Short Term Goal 1 - Progress (Week 1): Met OT Short Term Goal 2 (Week 1): Pt will complete 2 grooming tasks with no more than mod demonstrational cueing for initiation and completion. OT Short Term Goal 2 - Progress (Week 1): Progressing toward goal OT Short Term Goal 3 (Week 1): Pt will perform UB bathing with mod assist sitting supported. OT Short Term Goal 3 - Progress (Week 1): Progressing toward goal OT Short Term Goal 4 (Week 1): Pt will use the RUE for washing face or UB with no more than mod assist. OT Short Term Goal 4 - Progress (Week 1): Progressing toward goal OT Short Term Goal 5 (Week 1): Pt will perform toilet transfer stand pivot with no more than mod assist.  OT Short Term Goal 5 - Progress (Week 1): Progressing toward goal Week 2:  OT Short Term Goal 1 (Week 2): Pt will complete 2 grooming tasks with no more than mod demonstrational cueing for initiation and completion. OT Short Term Goal 2 (Week 2): Pt will perform UB bathing with mod assist sitting supported. OT Short Term Goal 3 (Week 2): Pt will use the RUE for washing face or UB with no more than mod assist. OT Short Term Goal 4 (Week 2): Pt will perform toilet transfer stand pivot with no more than mod assist.  Week 3:     Skilled Therapeutic Interventions/Progress Updates:      Pt. Lying in in asleep.  Engaged in [name] to [name].  Pt showed minimal assist with BUE and only gripping with LUE.  Practiced AARoM in all planes at shoulder, elbow and hand in Left and [name]. Addressed  right-sided weakness and left gaze deviation..  Pt. Demonstrated decreased  focused attention and poor initiation with tasks at hand.  Left pt in bed with bed alarm , call bell, within reach.     Therapy Documentation Precautions:  Precautions Precautions: Fall Precaution Comments: fall, NPO, pusher to the right at times, non-verbal,  Restrictions Weight Bearing Restrictions: No General:   Vital Signs: Therapy Vitals Temp: 98.2 F (36.8 C) Temp Source: Oral Pulse Rate: 96 Resp: 18 BP: 112/77 mmHg Patient Position (if appropriate): Sitting Oxygen Therapy SpO2: 90 % O2 Device: Nasal Cannula O2 Flow Rate (L/min): 3 L/min Pain: PAINAD (Pain Assessment in Advanced Dementia) Breathing: occasional labored breathing, short period of hyperventilation Negative Vocalization: none Facial Expression: frown Body Language: relaxed Consolability: no need to console PAINAD Score: 2         Therapy/Group: Individual Therapy  Lisa Roca 12/04/2014, 3:48 PM

## 2014-12-04 NOTE — Progress Notes (Signed)
Pt in HD prior to shift change. HD called RN and asked if she could come give Xanax since it was not given prior to the start of HD, and they do not have any way of giving it via PEG. Xanax given to pt in HD by primary RN. Rudie Meyer, RN

## 2014-12-04 NOTE — Progress Notes (Signed)
Nutrition Follow-up   INTERVENTION:   Continue Nepro formula at goal rate of 60 ml/hr x 20 hours (can hold for 4 hours for therapy) via J-tube to provide 2160 kcal, 97 grams of protein, and 876 ml of free water.   Provide free water flushes of 50 ml BID.   NUTRITION DIAGNOSIS:   Inadequate oral intake related to inability to eat as evidenced by NPO status.  ongoing  GOAL:   Patient will meet greater than or equal to 90% of their needs  met  MONITOR:   TF tolerance, Weight trends, Labs, I & O's, Skin  REASON FOR ASSESSMENT:   Consult Enteral/tube feeding initiation and management  ASSESSMENT:   59 y.o. 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  Presented 11/10/2014 with right-sided weakness and left gaze deviation. MRI of the brain showed moderate size left MCA territory infarct affecting the left insula and operculum.  Consult received for conversion of continuous tube feeding to bolus tube feeding. However, pt has a G_J tube which was placed 08/12/14 due to duodenal ulcer perforation. We are unable to provide bolus tube feeding in the jejunum. Spoke with [name] Hoff, [place] and suggested contacting surgery to determine if tube can be converted to PEG/g-tube.  Per staff pt unlikely to eat again and will continue to need feeding access.   CBG's: 75-93  Diet Order:  Diet NPO time specified  Skin:   (MASD on buttocks and thigh)  Last BM:  9/1  Height:   Ht Readings from Last 1 Encounters:  11/11/14 5' 7.01" (1.702 m)    Weight:   Wt Readings from Last 1 Encounters:  12/04/14 164 lb 0.4 oz (74.4 kg)    Ideal Body Weight:  61 kg  BMI:  Body mass index is 25.68 kg/(m^2).  Estimated Nutritional Needs:   Kcal:  2000-2200  Protein:  95-115  grams  Fluid:  Per MD  EDUCATION NEEDS:   No education needs identified at this time  Mila Doce, [place], [place] Pager [phone] After Hours Pager

## 2014-12-05 ENCOUNTER — Inpatient Hospital Stay (HOSPITAL_COMMUNITY): Payer: Medicaid Other | Admitting: Speech Pathology

## 2014-12-05 ENCOUNTER — Inpatient Hospital Stay (HOSPITAL_COMMUNITY): Payer: Medicaid Other | Admitting: Physical Therapy

## 2014-12-05 LAB — GLUCOSE, CAPILLARY
GLUCOSE-CAPILLARY: 101 mg/dL — AB (ref 65–99)
GLUCOSE-CAPILLARY: 102 mg/dL — AB (ref 65–99)
GLUCOSE-CAPILLARY: 86 mg/dL (ref 65–99)
GLUCOSE-CAPILLARY: 99 mg/dL (ref 65–99)

## 2014-12-05 NOTE — Progress Notes (Signed)
[facility] ROUNDING NOTE   Subjective:   Interval History:  No complaints  Objective:  Vital signs in last 24 hours:  Temp:  [98 F (36.7 C)-98.2 F (36.8 C)] 98 F (36.7 C) (09/03 0528) Pulse Rate:  [96-112] 97 (09/03 0954) Resp:  [18-22] 22 (09/03 0954) BP: (112-186)/(76-117) 129/77 mmHg (09/03 0528) SpO2:  [90 %-98 %] 97 % (09/03 0954) Weight:  [70.3 kg (154 lb 15.7 oz)-82.872 kg (182 lb 11.2 oz)] 70.3 kg (154 lb 15.7 oz) (09/03 0528)  Weight change: 8.472 kg (18 lb 10.8 oz) Filed Weights   12/04/14 1859 12/04/14 2320 12/05/14 0528  Weight: 82.6 kg (182 lb 1.6 oz) 71.6 kg (157 lb 13.6 oz) 70.3 kg (154 lb 15.7 oz)    Intake/Output: I/O last 3 completed shifts: In: 200 [NG/GT:200] Out: 2500 [Other:2500]   Intake/Output this shift:     CVS- RRR RS- CTA ABD- BS present soft non-distended EXT- no edema   Basic Metabolic Panel:  Recent Labs Lab 11/28/14 1851 12/01/14 2303 12/02/14 1857 12/04/14 1958  NA 136  --  134* 134*  K 4.0 4.2 4.7 4.1  CL 97*  --  92* 92*  CO2 28  --  27 29  GLUCOSE 115*  --  80 93  BUN 51*  --  76* 57*  CREATININE 6.29*  --  7.92* 6.93*  CALCIUM 8.9  --  9.4 9.0  PHOS 3.7  --  4.4 4.3    Liver Function Tests:  Recent Labs Lab 11/28/14 1851 12/02/14 1857 12/04/14 1958  ALBUMIN 2.4* 2.6* 2.5*   No results for input(s): LIPASE, AMYLASE in the last 168 hours. No results for input(s): AMMONIA in the last 168 hours.  CBC:  Recent Labs Lab 11/28/14 1851 12/02/14 1901 12/04/14 1958  WBC 8.2 9.2 8.2  HGB 10.2* 9.6* 9.5*  HCT 32.9* 30.6* 30.3*  MCV 88.7 85.2 86.8  PLT 341 324 321    Cardiac Enzymes: No results for input(s): CKTOTAL, CKMB, CKMBINDEX, TROPONINI in the last 168 hours.  BNP: Invalid input(s): POCBNP  CBG:  Recent Labs Lab 12/04/14 0554 12/04/14 1155 12/04/14 1816 12/04/14 2321 12/05/14 0603  GLUCAP 85 84 91 89 102*    Microbiology: Results for orders placed or performed  during the hospital encounter of 11/10/14  MRSA PCR Screening     Status: None   Collection Time: 11/10/14  3:00 PM  Result Value Ref Range Status   MRSA by PCR NEGATIVE NEGATIVE Final    Comment:        The GeneXpert MRSA Assay (FDA approved for NASAL specimens only), is one component of a comprehensive MRSA colonization surveillance program. It is not intended to diagnose MRSA infection nor to guide or monitor treatment for MRSA infections.     Coagulation Studies: No results for input(s): LABPROT, INR in the last 72 hours.  Urinalysis: No results for input(s): COLORURINE, LABSPEC, PHURINE, GLUCOSEU, HGBUR, BILIRUBINUR, KETONESUR, PROTEINUR, UROBILINOGEN, NITRITE, LEUKOCYTESUR in the last 72 hours.  Invalid input(s): APPERANCEUR    Imaging: No results found.   Medications:   . feeding supplement (NEPRO CARB STEADY) 1,000 mL (12/04/14 2339)   . ALPRAZolam  0.5 mg Per Tube Q T,Th,Sa-HD  . antiseptic oral rinse  7 mL Mouth Rinse QID  . aspirin  325 mg Oral Daily  . budesonide  0.25 mg Nebulization BID  . chlorhexidine  15 mL Mouth/Throat BID  . [START ON 12/08/2014] darbepoetin (ARANESP) injection - DIALYSIS  100 mcg Intravenous  Q Tue-HD  . free water  50 mL Per Tube BID  . guaifenesin  200 mg Per Tube TID  . ipratropium-albuterol  3 mL Nebulization Q6H  . lanthanum  500 mg Per Tube TID  . levETIRAcetam  500 mg Oral BID  . levothyroxine  25 mcg Oral QAC breakfast  . mirtazapine  7.5 mg Per Tube QHS  . multivitamin  1 tablet Oral QHS  . pantoprazole sodium  40 mg Per Tube Q1200   sodium chloride, sodium chloride, acetaminophen, acetylcysteine, ALPRAZolam, alteplase, atropine, heparin, ipratropium-albuterol, lidocaine (PF), lidocaine-prilocaine, ondansetron **OR** ondansetron (ZOFRAN) IV, pentafluoroprop-tetrafluoroeth, sorbitol  Assessment/ Plan:  1. 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 ESRD - TTS, using AVF - still has IJ May remove IJ She is off  schedule at present 3. Anemia of CKD-Hgb 9.5- Aranesp 100 q weekly hd - if Hgb stays over 11 4. Secondary hyperparathyroidism - corr Ca 9 Phos 4.3 - Hectorol on hold -on fosrenol 500 tid/  5. HTN/volume - ok; below edw from outpt. 6. Nutrition - Albumin 2.5      LOS: 10 Niti Leisure W  :51 AM

## 2014-12-05 NOTE — Progress Notes (Signed)
Physical Therapy Session Note  Patient Details  Name: [name] MRN: [id number] Date of Birth: Apr 10, 1955  Today's Date: 12/05/2014 PT Individual Time: 1300-1400 PT Individual Time Calculation (min): 60 min   Short Term Goals: Week 2:  PT Short Term Goal 1 (Week 2): Pt will perform bed mobility minA and min cues, with bedrails PT Short Term Goal 2 (Week 2): Will perform stand pivot transfer w/c <> bed with consistent minA and min cues PT Short Term Goal 3 (Week 2): Pt will perform ambulation x50' and modA +1 PT Short Term Goal 4 (Week 2): Pt will perform ascent/descent of 8 3-inch stairs with maxA +1 PT Short Term Goal 5 (Week 2): Pt will perform w/c propulsion x50' with modA and max verbal cues  Skilled Therapeutic Interventions/Progress Updates:    Pt received seated in w/c; no c/o pain and agreeable to treatment. Gait training with +2 "3 muskateers" assistance. Initial attempt with RW placed in front of pt, and pt initiates forward reaching to RW, however once assisted to standing, pt unable to progress LEs with UEs supported on RW. Pt returned to sitting and RW removed. Second attempt with 3 muskateers style assistance, performed 150' including turns. No assist needed for LE progression, only assist needed for balance/stability. Attempted practicing of stand pivot transfers to mat table, however pt shakes her head no and is restless in w/c. Asked pt if she needed to use restroom and pt shakes head yes. Returned to room in w/c totalA. Sit>stand at sink with maxA +1, and w/c replaced with BSC rather than performing transfer due to urgency. Pt sat on BSC x6-7 minutes with standby A. Sit>stand x2 repetitions with UE support on sink to don brief and pants, and replace bsc with w/c. Pt instructed in use of call bell; requires hand over hand assistance and one demonstration before pt able to motor plan task and successful use call bell x2 reps. Pt seated in w/c with all needs within reach at  completion of session, QR belt intact.   Therapy Documentation Precautions:  Precautions Precautions: Fall Precaution Comments: fall, NPO, pusher to the right at times, non-verbal,  Restrictions Weight Bearing Restrictions: No Pain: Pain Assessment Pain Assessment: Faces Faces Pain Scale: No hurt   Function:  Toileting Toileting     Toileting steps completed by helper: Adjust clothing prior to toileting;Adjust clothing after toileting Toileting Assistive Devices: Other (comment) (UE support on sink) Assist level: Two helpers   Bed Mobility Roll left and right activity        Sit to lying activity        Lying to sitting activity        Mobility details     Transfers Sit to stand transfer   Sit to stand assist level: Moderate assist (Pt 50 - 74%/lift 2 legs/lift or lower) Sit to stand assistive device:  (UE support on sink)  Chair/bed transfer               Toilet transfer   Toilet transfer assistive device: Drop arm commode   Assist level to toilet: 2 helpers Assist level from toilet: 2 helpers      Special educational needs teacher device:  (3 muskateers) Max distance: 150 Assist level: 2 helpers  Walk 10 feet activity   Assist level: 2 helpers  Walk 50 feet with 2 turns activity   Assist level: 2 helpers  Walk 150  feet activity   Assist level: 2 helpers  Walk 10 feet on uneven surfaces activity      Stairs          Walk up/down 1 step activity        Walk up/down 4 steps activity      Walk up/down 12 steps activity      Pick up small objects from floor      Wheelchair          Wheel 50 feet with 2 turns activity      Wheel 150 feet activity         Therapy/Group: Individual Therapy  [name] 12/05/2014, 3:00 PM

## 2014-12-05 NOTE — Progress Notes (Signed)
[facility] ROUNDING NOTE   Subjective:   Interval History: no complaints this morning  Objective:  Vital signs in last 24 hours:  Temp:  [98 F (36.7 C)-98.2 F (36.8 C)] 98 F (36.7 C) (09/03 0528) Pulse Rate:  [96-112] 103 (09/03 0528) Resp:  [18-20] 18 (09/03 0528) BP: (112-186)/(76-117) 129/77 mmHg (09/03 0528) SpO2:  [90 %-98 %] 98 % (09/03 0528) Weight:  [70.3 kg (154 lb 15.7 oz)-82.872 kg (182 lb 11.2 oz)] 70.3 kg (154 lb 15.7 oz) (09/03 0528)  Weight change: 8.472 kg (18 lb 10.8 oz) Filed Weights   12/04/14 1859 12/04/14 2320 12/05/14 0528  Weight: 82.6 kg (182 lb 1.6 oz) 71.6 kg (157 lb 13.6 oz) 70.3 kg (154 lb 15.7 oz)    Intake/Output: I/O last 3 completed shifts: In: 200 [NG/GT:200] Out: 2500 [Other:2500]   Intake/Output this shift:     CVS- RRR RS- CTA ABD- BS present soft non-distended EXT- no edema   Basic Metabolic Panel:  Recent Labs Lab 11/28/14 1851 12/01/14 2303 12/02/14 1857 12/04/14 1958  NA 136  --  134* 134*  K 4.0 4.2 4.7 4.1  CL 97*  --  92* 92*  CO2 28  --  27 29  GLUCOSE 115*  --  80 93  BUN 51*  --  76* 57*  CREATININE 6.29*  --  7.92* 6.93*  CALCIUM 8.9  --  9.4 9.0  PHOS 3.7  --  4.4 4.3    Liver Function Tests:  Recent Labs Lab 11/28/14 1851 12/02/14 1857 12/04/14 1958  ALBUMIN 2.4* 2.6* 2.5*   No results for input(s): LIPASE, AMYLASE in the last 168 hours. No results for input(s): AMMONIA in the last 168 hours.  CBC:  Recent Labs Lab 11/28/14 1851 12/02/14 1901 12/04/14 1958  WBC 8.2 9.2 8.2  HGB 10.2* 9.6* 9.5*  HCT 32.9* 30.6* 30.3*  MCV 88.7 85.2 86.8  PLT 341 324 321    Cardiac Enzymes: No results for input(s): CKTOTAL, CKMB, CKMBINDEX, TROPONINI in the last 168 hours.  BNP: Invalid input(s): POCBNP  CBG:  Recent Labs Lab 12/04/14 0554 12/04/14 1155 12/04/14 1816 12/04/14 2321 12/05/14 0603  GLUCAP 85 84 91 89 102*    Microbiology: Results for orders placed or  performed during the hospital encounter of 11/10/14  MRSA PCR Screening     Status: None   Collection Time: 11/10/14  3:00 PM  Result Value Ref Range Status   MRSA by PCR NEGATIVE NEGATIVE Final    Comment:        The GeneXpert MRSA Assay (FDA approved for NASAL specimens only), is one component of a comprehensive MRSA colonization surveillance program. It is not intended to diagnose MRSA infection nor to guide or monitor treatment for MRSA infections.     Coagulation Studies: No results for input(s): LABPROT, INR in the last 72 hours.  Urinalysis: No results for input(s): COLORURINE, LABSPEC, PHURINE, GLUCOSEU, HGBUR, BILIRUBINUR, KETONESUR, PROTEINUR, UROBILINOGEN, NITRITE, LEUKOCYTESUR in the last 72 hours.  Invalid input(s): APPERANCEUR    Imaging: No results found.   Medications:   . feeding supplement (NEPRO CARB STEADY) 1,000 mL (12/04/14 2339)   . ALPRAZolam  0.5 mg Per Tube Q T,Th,Sa-HD  . antiseptic oral rinse  7 mL Mouth Rinse QID  . aspirin  325 mg Oral Daily  . budesonide  0.25 mg Nebulization BID  . chlorhexidine  15 mL Mouth/Throat BID  . [START ON 12/08/2014] darbepoetin (ARANESP) injection - DIALYSIS  100 mcg  Intravenous Q Tue-HD  . free water  50 mL Per Tube BID  . guaifenesin  200 mg Per Tube TID  . ipratropium-albuterol  3 mL Nebulization Q6H  . lanthanum  500 mg Per Tube TID  . levETIRAcetam  500 mg Oral BID  . levothyroxine  25 mcg Oral QAC breakfast  . mirtazapine  7.5 mg Per Tube QHS  . multivitamin  1 tablet Oral QHS  . pantoprazole sodium  40 mg Per Tube Q1200   sodium chloride, sodium chloride, acetaminophen, acetylcysteine, ALPRAZolam, alteplase, atropine, heparin, ipratropium-albuterol, lidocaine (PF), lidocaine-prilocaine, ondansetron **OR** ondansetron (ZOFRAN) IV, pentafluoroprop-tetrafluoroeth, sorbitol  Assessment/ Plan:  1. 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 ESRD - TTS, using AVF - still has IJ  May remove IJ   She is off schedule at present 3. Anemia of CKD-Hgb 9.5- Aranesp 100 q weekly hd - if Hgb stays over 11 4. Secondary hyperparathyroidism - corr Ca 9  Phos 4.3 - Hectorol on hold -on fosrenol 500 tid/  5. HTN/volume - ok; below edw from outpt. 6. Nutrition -  Albumin 2.5       LOS: 10 [name] W  :36 AM

## 2014-12-05 NOTE — Plan of Care (Signed)
Problem: RH SKIN INTEGRITY Goal: RH STG SKIN FREE OF INFECTION/BREAKDOWN With Mod. Assisst  Outcome: Not Progressing Total assist    Goal: RH STG MAINTAIN SKIN INTEGRITY WITH ASSISTANCE STG Maintain Skin Integrity With Mod. Assistance.  Outcome: Not Progressing Total assist

## 2014-12-05 NOTE — Progress Notes (Signed)
Speech Language Pathology Daily Session Note  Patient Details  Name: [name] MRN: [id number] Date of Birth: [birth year]  Today's Date: 12/05/2014 SLP Individual Time: 4098-1191 SLP Individual Time Calculation (min): 29 min  Short Term Goals: Week 2: SLP Short Term Goal 1 (Week 2): Pt will improve management of her secretions as evidenced by ability to orally expectorate secretions and anteriorly contain her saliva with max assist multimodal cues.   SLP Short Term Goal 2 (Week 2): Pt will consume PO trials with max assist multimodal cues to clear boluses from the oral cavity and to initiate swallow in a timely manner over 3 consecutive sessions prior to completion of objective swallow study.  SLP Short Term Goal 3 (Week 2): Pt will initiate a basic,familiar task with max assist multimodal cues over 75% of observable opportunities.  SLP Short Term Goal 4 (Week 2): Pt will visually scan to midline in >25% of observable opportunities during basic, functional tasks with max assist multimodal cues.  SLP Short Term Goal 5 (Week 2): Pt will vocalize to produce vowels in isolation with max assist multimodal cues.    Skilled Therapeutic Interventions:  Pt was seen for skilled ST targeting cognitive goals. Upon arrival, pt was partially reclined in bed and was noted to initiate bringing her legs over to the side of the bed.  When SLP asked if pt wanted to get out of bed, pt nodded yes and made good eye contact with SLP.  Pt assisted in transfer with max assist multimodal cues for initiation to come from supine to sitting.  Pt was transferred to wheelchair with +2 assist and gave and SLP a high five on completion of transfer with supervision cues for initiation.  Pt's affect was overall brighter today in comparison to previous therapy sessions and her initiation was slightly improved but she continues to require max to hand over hand assist to complete basic self care tasks in a timely manner.  Pt was left  in wheelchair with quick release belt donned and call bell left within reach.  Pt acknowledged SLP's departure with a head nod in response to SLP saying "good bye."  Continue per current plan of care.    Function:  Eating Eating                 Cognition Comprehension Comprehension assist level: Understands basic 25 - 49% of the time/ requires cueing 50 - 75% of the time  Expression   Expression assist level: Expresses basis less than 25% of the time/requires cueing >75% of the time.  Social Interaction Social Interaction assist level: Interacts appropriately 25 - 49% of time - Needs frequent redirection.  Problem Solving Problem solving assist level: Solves basic less than 25% of the time - needs direction nearly all the time or does not effectively solve problems and may need a restraint for safety  Memory Memory assist level: Recognizes or recalls less than 25% of the time/requires cueing greater than 75% of the time    Pain Pain Assessment Pain Assessment: Faces Faces Pain Scale: No hurt  Therapy/Group: Individual Therapy  Nikhita Mentzel, Melanee Spry 12/05/2014, 12:29 PM

## 2014-12-05 NOTE — Progress Notes (Signed)
[name] is a 59 y.o. female 11/03/1955 [id number]  Subjective: No new complaints. [place]. Objective: Vital signs in last 24 hours: Temp:  [98 F (36.7 C)-98.2 F (36.8 C)] 98 F (36.7 C) (09/03 0528) Pulse Rate:  [96-112] 97 (09/03 0954) Resp:  [18-22] 22 (09/03 0954) BP: (112-186)/(76-117) 129/77 mmHg (09/03 0528) SpO2:  [90 %-98 %] 97 % (09/03 0954) Weight:  [154 lb 15.7 oz (70.3 kg)-182 lb 11.2 oz (82.872 kg)] 154 lb 15.7 oz (70.3 kg) (09/03 0528) Weight change: 18 lb 10.8 oz (8.472 kg) Last BM Date: 12/05/14  Intake/Output from previous day: 09/02 0701 - 09/03 0700 In: 200 [NG/GT:200] Out: 2500  Last cbgs: CBG (last 3)   Recent Labs  12/04/14 2321 12/05/14 0603 12/05/14 1146  GLUCAP 89 102* 99     Physical Exam General: No apparent distress   HEENT: not dry Lungs: Normal effort. Lungs clear to auscultation, no crackles or wheezes. Cardiovascular: Regular rate and rhythm, no edema Abdomen: S/NT/ND; BS(+) Musculoskeletal:  unchanged Neurological: No new neurological deficits Wounds: N/A   Skin: clear  Aging changes Mental state: Alert    Lab Results: BMET    Component Value Date/Time   NA 134* 12/04/2014 1958   NA 139 10/02/2012 1046   K 4.1 12/04/2014 1958   K 4.4 10/02/2012 1046   CL 92* 12/04/2014 1958   CO2 29 12/04/2014 1958   CO2 25 10/02/2012 1046   GLUCOSE 93 12/04/2014 1958   GLUCOSE 110 10/02/2012 1046   BUN 57* 12/04/2014 1958   BUN 35.2* 10/02/2012 1046   CREATININE 6.93* 12/04/2014 1958   CREATININE 2.5* 10/02/2012 1046   CALCIUM 9.0 12/04/2014 1958   CALCIUM 9.8 10/02/2012 1046   GFRNONAA 6* 12/04/2014 1958   GFRAA 7* 12/04/2014 1958   CBC    Component Value Date/Time   WBC 8.2 12/04/2014 1958   WBC 6.8 10/02/2012 1046   RBC 3.49* 12/04/2014 1958   RBC 4.87 10/02/2012 1046   HGB 9.5* 12/04/2014 1958   HGB 13.1 10/02/2012 1046   HCT 30.3* 12/04/2014 1958   HCT 40.3 10/02/2012 1046   PLT 321 12/04/2014 1958   PLT 359  10/02/2012 1046   MCV 86.8 12/04/2014 1958   MCV 82.6 10/02/2012 1046   MCH 27.2 12/04/2014 1958   MCH 26.8 10/02/2012 1046   MCHC 31.4 12/04/2014 1958   MCHC 32.5 10/02/2012 1046   RDW 18.5* 12/04/2014 1958   RDW 17.4* 10/02/2012 1046   LYMPHSABS 1.0 11/27/2014 1008   LYMPHSABS 1.9 10/02/2012 1046   MONOABS 1.3* 11/27/2014 1008   MONOABS 0.8 10/02/2012 1046   EOSABS 0.3 11/27/2014 1008   EOSABS 0.2 10/02/2012 1046   BASOSABS 0.0 11/27/2014 1008   BASOSABS 0.1 10/02/2012 1046    Studies/Results: No results found.  Medications: I have reviewed the patient's current medications.  Assessment/Plan:  1. Functional deficits secondary to left MCA infarct/respiratory failure. Extubated 11/20/2014. RR tends to be elevated but sats ok, wheezing will schedule nebs 2. DVT Prophylaxis/Anticoagulation: SCDs. Monitor for any signs of DVT. 8/12 venous dopplers neg 3. Pain Management: Tylenol as needed, no apparent pain during PT 4. Dysphagia. Patient is nothing by mouth. Status post gastrostomy tube 08/12/2014 per interventional radiology for nutritional support. poor prognosis for improvement given severe bilateral deficits, do not anticipate d/c PEG during this admission, discussed with SLP not to spend much time with swallow  5. Neuropsych: This patient is not capable of making decisions on her own behalf. 6. Skin/Wound  Care: Routine skin checks. PEG site maintenance. continue turning and nutrition 7. Fluids/Electrolytes/Nutrition: Routine I&O. Adjust TF based on nutritional needs. Renal adjusting dialysis, complicated situation, Not a diabetic, getting CBGs because of TF, these have been normal 8. End-stage renal disease with hemodialysis. Follow-up per renal services. Hemodialysis in PM after therapies to avoid conflict, Nepro for nutrition, last K was normal at 4.7 9. Seizure prophylaxis. Keppra 500 mg twice a day, monitor drug toxicity. No seizures, continue current dose no evidence of  excessive sedation 10. Hypothyroidism. Synthroid 11. Chronic anemia. Aranesp weekly with dialysis, hgb monitored in HD 12. History of asthma. Schedule nebulizers given aphasia. Anxiety component but this aspect is improving 13. Mood/anxiety. Continue Xanax on dialysis days 14. Hx of recurrent resp failure, high risk for PNA given comorbid conditions, RR increases at times. Comfortable. Secretions better. Afebrile. No distress. -continue to aggressively monitor, aspiration precautions, -wean oxygen down to keep sats >90% 15. Bowel incont- monitor skin 16. 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

## 2014-12-06 ENCOUNTER — Inpatient Hospital Stay (HOSPITAL_COMMUNITY): Payer: Medicaid Other | Admitting: Occupational Therapy

## 2014-12-06 LAB — GLUCOSE, CAPILLARY
GLUCOSE-CAPILLARY: 92 mg/dL (ref 65–99)
Glucose-Capillary: 117 mg/dL — ABNORMAL HIGH (ref 65–99)
Glucose-Capillary: 96 mg/dL (ref 65–99)

## 2014-12-06 NOTE — Progress Notes (Signed)
[name] is a 59 y.o. female 09-01-1955 [id number]  Subjective: No new complaints. [place]. Objective: Vital signs in last 24 hours: Temp:  [97.7 F (36.5 C)-98.2 F (36.8 C)] 97.7 F (36.5 C) (09/04 1330) Pulse Rate:  [82-109] 102 (09/04 1330) Resp:  [18-24] 20 (09/04 1330) BP: (119-132)/(75-78) 132/78 mmHg (09/04 1330) SpO2:  [95 %-100 %] 100 % (09/04 1330) Weight:  [157 lb 10.1 oz (71.5 kg)] 157 lb 10.1 oz (71.5 kg) (09/04 0610) Weight change: -25 lb 1.1 oz (-11.372 kg) Last BM Date: 12/05/14  Intake/Output from previous day:   Last cbgs: CBG (last 3)   Recent Labs  12/05/14 2350 12/06/14 0609 12/06/14 1140  GLUCAP 101* 117* 92     Physical Exam General: No apparent distress   HEENT: not dry Lungs: Normal effort. Lungs clear to auscultation, no crackles or wheezes. Cardiovascular: Regular rate and rhythm, no edema Abdomen: S/NT/ND; BS(+) Musculoskeletal:  unchanged Neurological: No new neurological deficits Wounds: N/A   Skin: clear  Aging changes Mental state: Alert    Lab Results: BMET    Component Value Date/Time   NA 134* 12/04/2014 1958   NA 139 10/02/2012 1046   K 4.1 12/04/2014 1958   K 4.4 10/02/2012 1046   CL 92* 12/04/2014 1958   CO2 29 12/04/2014 1958   CO2 25 10/02/2012 1046   GLUCOSE 93 12/04/2014 1958   GLUCOSE 110 10/02/2012 1046   BUN 57* 12/04/2014 1958   BUN 35.2* 10/02/2012 1046   CREATININE 6.93* 12/04/2014 1958   CREATININE 2.5* 10/02/2012 1046   CALCIUM 9.0 12/04/2014 1958   CALCIUM 9.8 10/02/2012 1046   GFRNONAA 6* 12/04/2014 1958   GFRAA 7* 12/04/2014 1958   CBC    Component Value Date/Time   WBC 8.2 12/04/2014 1958   WBC 6.8 10/02/2012 1046   RBC 3.49* 12/04/2014 1958   RBC 4.87 10/02/2012 1046   HGB 9.5* 12/04/2014 1958   HGB 13.1 10/02/2012 1046   HCT 30.3* 12/04/2014 1958   HCT 40.3 10/02/2012 1046   PLT 321 12/04/2014 1958   PLT 359 10/02/2012 1046   MCV 86.8 12/04/2014 1958   MCV 82.6 10/02/2012 1046    MCH 27.2 12/04/2014 1958   MCH 26.8 10/02/2012 1046   MCHC 31.4 12/04/2014 1958   MCHC 32.5 10/02/2012 1046   RDW 18.5* 12/04/2014 1958   RDW 17.4* 10/02/2012 1046   LYMPHSABS 1.0 11/27/2014 1008   LYMPHSABS 1.9 10/02/2012 1046   MONOABS 1.3* 11/27/2014 1008   MONOABS 0.8 10/02/2012 1046   EOSABS 0.3 11/27/2014 1008   EOSABS 0.2 10/02/2012 1046   BASOSABS 0.0 11/27/2014 1008   BASOSABS 0.1 10/02/2012 1046    Studies/Results: No results found.  Medications: I have reviewed the patient's current medications.  Assessment/Plan:  1. Functional deficits secondary to left MCA infarct/respiratory failure. Extubated 11/20/2014. RR tends to be elevated but sats ok, wheezing will schedule nebs 2. DVT Prophylaxis/Anticoagulation: SCDs. Monitor for any signs of DVT. 8/12 venous dopplers neg 3. Pain Management: Tylenol as needed, no apparent pain during PT 4. Dysphagia. Patient is nothing by mouth. Status post gastrostomy tube 08/12/2014 per interventional radiology for nutritional support. poor prognosis for improvement given severe bilateral deficits, do not anticipate d/c PEG during this admission, discussed with SLP not to spend much time with swallow  5. Neuropsych: This patient is not capable of making decisions on her own behalf. 6. Skin/Wound Care: Routine skin checks. PEG site maintenance. continue turning and nutrition 7. Fluids/Electrolytes/Nutrition: Routine  I&O. Adjust TF based on nutritional needs. Renal adjusting dialysis, complicated situation, Not a diabetic, getting CBGs because of TF, these have been normal 8. End-stage renal disease with hemodialysis. Follow-up per renal services. Hemodialysis in PM after therapies to avoid conflict, Nepro for nutrition, last K was normal at 4.7 9. Seizure prophylaxis. Keppra 500 mg twice a day, monitor drug toxicity. No seizures, continue current dose no evidence of excessive sedation 10. Hypothyroidism. Synthroid 11. Chronic anemia.  Aranesp weekly with dialysis, hgb monitored in HD 12. History of asthma. Schedule nebulizers given aphasia. Anxiety component but this aspect is improving 13. Mood/anxiety. Continue Xanax on dialysis days 14. Hx of recurrent resp failure, high risk for PNA given comorbid conditions, RR increases at times. Comfortable. Secretions better. Afebrile. No distress. -continue to aggressively monitor, aspiration precautions, -wean oxygen down to keep sats >90% 15. Bowel incont- monitor skin 16. 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

## 2014-12-06 NOTE — Progress Notes (Signed)
[facility] ROUNDING NOTE   Subjective:   Interval History: no complaints  Objective:  Vital signs in last 24 hours:  Temp:  [98.1 F (36.7 C)-98.2 F (36.8 C)] 98.2 F (36.8 C) (09/04 0610) Pulse Rate:  [82-109] 82 (09/04 0610) Resp:  [18-24] 20 (09/04 0610) BP: (119-132)/(75-77) 132/75 mmHg (09/04 0610) SpO2:  [95 %-97 %] 96 % (09/04 0723) Weight:  [71.5 kg (157 lb 10.1 oz)] 71.5 kg (157 lb 10.1 oz) (09/04 0610)  Weight change: -11.372 kg (-25 lb 1.1 oz) Filed Weights   12/04/14 2320 12/05/14 0528 12/06/14 0610  Weight: 71.6 kg (157 lb 13.6 oz) 70.3 kg (154 lb 15.7 oz) 71.5 kg (157 lb 10.1 oz)    Intake/Output: I/O last 3 completed shifts: In: -  Out: 2500 [Other:2500]   Intake/Output this shift:  Total I/O In: 50 [Other:50] Out: -   CVS- RRR RS- CTA ABD- BS present soft non-distended EXT- no edema   Basic Metabolic Panel:  Recent Labs Lab 12/01/14 2303 12/02/14 1857 12/04/14 1958  NA  --  134* 134*  K 4.2 4.7 4.1  CL  --  92* 92*  CO2  --  27 29  GLUCOSE  --  80 93  BUN  --  76* 57*  CREATININE  --  7.92* 6.93*  CALCIUM  --  9.4 9.0  PHOS  --  4.4 4.3    Liver Function Tests:  Recent Labs Lab 12/02/14 1857 12/04/14 1958  ALBUMIN 2.6* 2.5*   No results for input(s): LIPASE, AMYLASE in the last 168 hours. No results for input(s): AMMONIA in the last 168 hours.  CBC:  Recent Labs Lab 12/02/14 1901 12/04/14 1958  WBC 9.2 8.2  HGB 9.6* 9.5*  HCT 30.6* 30.3*  MCV 85.2 86.8  PLT 324 321    Cardiac Enzymes: No results for input(s): CKTOTAL, CKMB, CKMBINDEX, TROPONINI in the last 168 hours.  BNP: Invalid input(s): POCBNP  CBG:  Recent Labs Lab 12/05/14 0603 12/05/14 1146 12/05/14 1828 12/05/14 2350 12/06/14 0609  GLUCAP 102* 99 86 101* 117*    Microbiology: Results for orders placed or performed during the hospital encounter of 11/10/14  MRSA PCR Screening     Status: None   Collection Time: 11/10/14   3:00 PM  Result Value Ref Range Status   MRSA by PCR NEGATIVE NEGATIVE Final    Comment:        The GeneXpert MRSA Assay (FDA approved for NASAL specimens only), is one component of a comprehensive MRSA colonization surveillance program. It is not intended to diagnose MRSA infection nor to guide or monitor treatment for MRSA infections.     Coagulation Studies: No results for input(s): LABPROT, INR in the last 72 hours.  Urinalysis: No results for input(s): COLORURINE, LABSPEC, PHURINE, GLUCOSEU, HGBUR, BILIRUBINUR, KETONESUR, PROTEINUR, UROBILINOGEN, NITRITE, LEUKOCYTESUR in the last 72 hours.  Invalid input(s): APPERANCEUR    Imaging: No results found.   Medications:   . feeding supplement (NEPRO CARB STEADY) 1,000 mL (12/04/14 2339)   . ALPRAZolam  0.5 mg Per Tube Q T,Th,Sa-HD  . antiseptic oral rinse  7 mL Mouth Rinse QID  . aspirin  325 mg Oral Daily  . budesonide  0.25 mg Nebulization BID  . chlorhexidine  15 mL Mouth/Throat BID  . [START ON 12/08/2014] darbepoetin (ARANESP) injection - DIALYSIS  100 mcg Intravenous Q Tue-HD  . free water  50 mL Per Tube BID  . guaifenesin  200 mg Per Tube TID  .  ipratropium-albuterol  3 mL Nebulization Q6H  . lanthanum  500 mg Per Tube TID  . levETIRAcetam  500 mg Oral BID  . levothyroxine  25 mcg Oral QAC breakfast  . mirtazapine  7.5 mg Per Tube QHS  . multivitamin  1 tablet Oral QHS  . pantoprazole sodium  40 mg Per Tube Q1200   sodium chloride, sodium chloride, acetaminophen, acetylcysteine, ALPRAZolam, alteplase, atropine, heparin, ipratropium-albuterol, lidocaine (PF), lidocaine-prilocaine, ondansetron **OR** ondansetron (ZOFRAN) IV, pentafluoroprop-tetrafluoroeth, sorbitol  Assessment/ Plan:  1. 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 ESRD - TTS, using AVF - still has IJ May remove IJ She is off schedule at present 3. Anemia of CKD-Hgb 9.5- Aranesp 100 q weekly hd - if Hgb stays over 11 4. Secondary  hyperparathyroidism - corr Ca 9 Phos 4.3 - Hectorol on hold -on fosrenol 500 tid/  5. HTN/volume - ok; below edw from outpt. 6. Nutrition - Albumin 2.5    LOS: 11 [name] W @TODAY @11 :15 AM

## 2014-12-06 NOTE — Progress Notes (Signed)
Occupational Therapy Session Note  Patient Details  Name: [name] MRN: [id number] Date of Birth: [birth year]  Today's Date: 12/06/2014 OT Individual Time: 1445-1530    OT Individual Time Calculation (min): 45 min    Short Term Goals: Week 1:  OT Short Term Goal 1 (Week 1): Pt will maintain static sittting balance for 10 mins with no more than mod assist in preparation for selfcare tasks.   OT Short Term Goal 1 - Progress (Week 1): Met OT Short Term Goal 2 (Week 1): Pt will complete 2 grooming tasks with no more than mod demonstrational cueing for initiation and completion. OT Short Term Goal 2 - Progress (Week 1): Progressing toward goal OT Short Term Goal 3 (Week 1): Pt will perform UB bathing with mod assist sitting supported. OT Short Term Goal 3 - Progress (Week 1): Progressing toward goal OT Short Term Goal 4 (Week 1): Pt will use the RUE for washing face or UB with no more than mod assist. OT Short Term Goal 4 - Progress (Week 1): Progressing toward goal OT Short Term Goal 5 (Week 1): Pt will perform toilet transfer stand pivot with no more than mod assist.  OT Short Term Goal 5 - Progress (Week 1): Progressing toward goal Week 2:  OT Short Term Goal 1 (Week 2): Pt will complete 2 grooming tasks with no more than mod demonstrational cueing for initiation and completion. OT Short Term Goal 2 (Week 2): Pt will perform UB bathing with mod assist sitting supported. OT Short Term Goal 3 (Week 2): Pt will use the RUE for washing face or UB with no more than mod assist. OT Short Term Goal 4 (Week 2): Pt will perform toilet transfer stand pivot with no more than mod assist.       Skilled Therapeutic Interventions/Progress Updates:    Pt. Indicated the need to go to bathroom.  Transferred from bed to [facility] with max assist sit to stand and mod assist with stand pivot.  While sitting on toilet, pt started crying.  Nurse stated it may be because her daughter left but not sure.  Pt stopped  crying after about 20 minutes. Pt would not take tissues and dry eyes.  Pt was continent of bowel.  Pt. Answered yes, no questions about 25 % of time.  Transferred from [facility] to bed and left in supine with all needs in place.   Therapy Documentation Precautions:  Precautions Precautions: Fall Precaution Comments: fall, NPO, pusher to the right at times, non-verbal,  Restrictions Weight Bearing Restrictions: No      Pain:         Function:   Eating Eating Eating activity did not occur: Safety/medical concerns (continuous tube feeding, NPO) Eating Assist Level: Helper performs IV, parenteral or tube feed                 Toileting Toileting     Toileting steps completed by helper: Adjust clothing prior to toileting;Adjust clothing after toileting;Performs perineal hygiene    Toileting assist Assist level: Two helpers    Bed Mobility Roll left and right activity     Roll left and right assistive device: Bedrails  Sit to lying activity   Assist level: Max assist (Pt 25 - 49%) Sit to lying assistive device: Bedrails;HOB elevated  Lying to sitting activity   Assist level: Moderate assist (Pt 50 - 74%, lift 2 legs) Lying to sitting assistive device: HOB elevated;Bedrails  Mobility details Bed mobility details: Tactile  cues for initiation;Tactile cues for weight shifting;Visual cues/gestures for sequencing;Manual facilitation for weight shifting    Transfers Sit to stand transfer   Sit to stand assist level: Maximal assist (Pt 25 - 49%/lift and lower)    Chair/bed transfer   Chair/bed transfer method: Stand pivot Chair/bed transfer assist level: Maximal assist (Pt 25 - 49%/lift and lower) Chair/bed transfer assistive device: Armrests;Bedrails   Chair/bed transfer details: Visual cues/gestures for sequencing;Manual facilitation for weight shifting;Manual facilitation for placement;Tactile cues for weight shifting  Toilet transfer Toilet transfer activity did not  occur: Safety/medical concerns Toilet transfer assistive device: Drop arm commode       Assist level to toilet: 2 helpers Assist level from toilet: 2 helpers  Film/video editor Comprehension Comprehension assist level: Understands basic less than 25% of the time/ requires cueing >75% of the time  Expression Expression assist level: Expresses basis less than 25% of the time/requires cueing >75% of the time.  Social Interaction Social Interaction assist level: Interacts appropriately less than 25% of the time. May be withdrawn or combative.  Problem Solving Problem solving assist level: Solves basic less than 25% of the time - needs direction nearly all the time or does not effectively solve problems and may need a restraint for safety  Memory Memory assist level: Recognizes or recalls less than 25% of the time/requires cueing greater than 75% of the time    Therapy/Group: Individual Therapy  Lisa Roca 12/06/2014, 5:47 PM

## 2014-12-07 ENCOUNTER — Inpatient Hospital Stay (HOSPITAL_COMMUNITY): Payer: Medicaid Other | Admitting: Speech Pathology

## 2014-12-07 ENCOUNTER — Inpatient Hospital Stay (HOSPITAL_COMMUNITY): Payer: Medicaid Other | Admitting: Physical Therapy

## 2014-12-07 ENCOUNTER — Inpatient Hospital Stay (HOSPITAL_COMMUNITY): Payer: Medicaid Other

## 2014-12-07 LAB — GLUCOSE, CAPILLARY
GLUCOSE-CAPILLARY: 92 mg/dL (ref 65–99)
Glucose-Capillary: 108 mg/dL — ABNORMAL HIGH (ref 65–99)
Glucose-Capillary: 77 mg/dL (ref 65–99)
Glucose-Capillary: 85 mg/dL (ref 65–99)

## 2014-12-07 LAB — RENAL FUNCTION PANEL
Albumin: 2.5 g/dL — ABNORMAL LOW (ref 3.5–5.0)
Anion gap: 12 (ref 5–15)
BUN: 58 mg/dL — ABNORMAL HIGH (ref 6–20)
CO2: 29 mmol/L (ref 22–32)
Calcium: 8.8 mg/dL — ABNORMAL LOW (ref 8.9–10.3)
Chloride: 90 mmol/L — ABNORMAL LOW (ref 101–111)
Creatinine, Ser: 6.25 mg/dL — ABNORMAL HIGH (ref 0.44–1.00)
GFR calc Af Amer: 8 mL/min — ABNORMAL LOW (ref >60)
GFR calc non Af Amer: 7 mL/min — ABNORMAL LOW (ref >60)
Glucose, Bld: 92 mg/dL (ref 65–99)
Phosphorus: 3.8 mg/dL (ref 2.5–4.6)
Potassium: 3.9 mmol/L (ref 3.5–5.1)
Sodium: 131 mmol/L — ABNORMAL LOW (ref 135–145)

## 2014-12-07 LAB — CBC
HCT: 30.2 % — ABNORMAL LOW (ref 36.0–46.0)
Hemoglobin: 9.6 g/dL — ABNORMAL LOW (ref 12.0–15.0)
MCH: 27.2 pg (ref 26.0–34.0)
MCHC: 31.8 g/dL (ref 30.0–36.0)
MCV: 85.6 fL (ref 78.0–100.0)
Platelets: 297 10*3/uL (ref 150–400)
RBC: 3.53 MIL/uL — ABNORMAL LOW (ref 3.87–5.11)
RDW: 18.1 % — ABNORMAL HIGH (ref 11.5–15.5)
WBC: 2.9 10*3/uL — ABNORMAL LOW (ref 4.0–10.5)

## 2014-12-07 MED ORDER — ALTEPLASE 2 MG IJ SOLR: 2.0000 mg | Freq: Once | INTRAMUSCULAR | Status: DC | PRN

## 2014-12-07 MED ORDER — HEPARIN SODIUM (PORCINE) 1000 UNIT/ML DIALYSIS: 1000.0000 [IU] | INTRAMUSCULAR | Status: DC | PRN

## 2014-12-07 MED ORDER — LIDOCAINE HCL (PF) 1 % IJ SOLN: 5.0000 mL | INTRAMUSCULAR | Status: DC | PRN

## 2014-12-07 MED ORDER — SODIUM CHLORIDE 0.9 % IV SOLN: 100.0000 mL | INTRAVENOUS | Status: DC | PRN

## 2014-12-07 MED ORDER — LIDOCAINE-PRILOCAINE 2.5-2.5 % EX CREA
1.0000 "application " | TOPICAL_CREAM | CUTANEOUS | Status: DC | PRN
  Filled 2014-12-07: qty 5

## 2014-12-07 MED ORDER — PENTAFLUOROPROP-TETRAFLUOROETH EX AERO: 1.0000 "application " | INHALATION_SPRAY | CUTANEOUS | Status: DC | PRN

## 2014-12-07 MED ORDER — METHYLPHENIDATE HCL 5 MG PO TABS
5.0000 mg | ORAL_TABLET | Freq: Two times a day (BID) | ORAL | Status: DC
  Administered 2014-12-07 – 2014-12-09 (×4): 5 mg
  Filled 2014-12-07 (×4): qty 1

## 2014-12-07 NOTE — Progress Notes (Signed)
Subjective/Complaints: Patient remains aphasic, but is now accurate and using head nods for Y/N questions. In bed opens eyes to command. No evidence of respiratory distress   ROS cannot obtain due to global aphasia   Objective: Vital Signs: Blood pressure 125/73, pulse 92, temperature 97.7 F (36.5 C), temperature source Oral, resp. rate 19, weight 73.5 kg (162 lb 0.6 oz), SpO2 97 %. No results found. Results for orders placed or performed during the hospital encounter of 11/25/14 (from the past 72 hour(s))  Glucose, capillary     Status: None   Collection Time: 12/04/14 11:55 AM  Result Value Ref Range   Glucose-Capillary 84 65 - 99 mg/dL  Glucose, capillary     Status: None   Collection Time: 12/04/14  6:16 PM  Result Value Ref Range   Glucose-Capillary 91 65 - 99 mg/dL  Renal function panel     Status: Abnormal   Collection Time: 12/04/14  7:58 PM  Result Value Ref Range   Sodium 134 (L) 135 - 145 mmol/L   Potassium 4.1 3.5 - 5.1 mmol/L   Chloride 92 (L) 101 - 111 mmol/L   CO2 29 22 - 32 mmol/L   Glucose, Bld 93 65 - 99 mg/dL   BUN 57 (H) 6 - 20 mg/dL   Creatinine, Ser 6.93 (H) 0.44 - 1.00 mg/dL   Calcium 9.0 8.9 - 10.3 mg/dL   Phosphorus 4.3 2.5 - 4.6 mg/dL   Albumin 2.5 (L) 3.5 - 5.0 g/dL   GFR calc non Af Amer 6 (L) >60 mL/min   GFR calc Af Amer 7 (L) >60 mL/min    Comment: (NOTE) The eGFR has been calculated using the CKD EPI equation. This calculation has not been validated in all clinical situations. eGFR's persistently <60 mL/min signify possible Chronic Kidney Disease.    Anion gap 13 5 - 15  CBC     Status: Abnormal   Collection Time: 12/04/14  7:58 PM  Result Value Ref Range   WBC 8.2 4.0 - 10.5 K/uL   RBC 3.49 (L) 3.87 - 5.11 MIL/uL   Hemoglobin 9.5 (L) 12.0 - 15.0 g/dL   HCT 30.3 (L) 36.0 - 46.0 %   MCV 86.8 78.0 - 100.0 fL   MCH 27.2 26.0 - 34.0 pg   MCHC 31.4 30.0 - 36.0 g/dL   RDW 18.5 (H) 11.5 - 15.5 %   Platelets 321 150 - 400 K/uL   Glucose, capillary     Status: None   Collection Time: 12/04/14 11:21 PM  Result Value Ref Range   Glucose-Capillary 89 65 - 99 mg/dL  Glucose, capillary     Status: Abnormal   Collection Time: 12/05/14  6:03 AM  Result Value Ref Range   Glucose-Capillary 102 (H) 65 - 99 mg/dL  Glucose, capillary     Status: None   Collection Time: 12/05/14 11:46 AM  Result Value Ref Range   Glucose-Capillary 99 65 - 99 mg/dL  Glucose, capillary     Status: None   Collection Time: 12/05/14  6:28 PM  Result Value Ref Range   Glucose-Capillary 86 65 - 99 mg/dL  Glucose, capillary     Status: Abnormal   Collection Time: 12/05/14 11:50 PM  Result Value Ref Range   Glucose-Capillary 101 (H) 65 - 99 mg/dL  Glucose, capillary     Status: Abnormal   Collection Time: 12/06/14  6:09 AM  Result Value Ref Range   Glucose-Capillary 117 (H) 65 - 99 mg/dL  Glucose,  capillary     Status: None   Collection Time: 12/06/14 11:40 AM  Result Value Ref Range   Glucose-Capillary 92 65 - 99 mg/dL  Glucose, capillary     Status: None   Collection Time: 12/06/14  6:02 PM  Result Value Ref Range   Glucose-Capillary 96 65 - 99 mg/dL  Glucose, capillary     Status: None   Collection Time: 12/06/14 11:58 PM  Result Value Ref Range   Glucose-Capillary 85 65 - 99 mg/dL  Glucose, capillary     Status: None   Collection Time: 12/07/14  6:24 AM  Result Value Ref Range   Glucose-Capillary 92 65 - 99 mg/dL     HEENT: poor oral movements  facial diplegia, cannot open mouth to command Cardio: RRR and no murmur Resp: CTA B/L.no use of accesory muscles, No wheezes and no upper airway sounds, not able to cough on command GI: BS positive and NT, ND, pior miline abd incision with extensive scarring Extremity:  Pulses positive and No Edema Skin:   Other G tube site NT Neuro: Flat but very alert, Cranial Nerve Abnormalities bilateral central 7,Aphasic and Apraxic Musc/Skel:  Other decrease ROM in BIlateral knees and Right  shoulder and elbow due to tone Gen NAD   Assessment/Plan: 1. 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 Physiatrist is providing close team supervision and 24 hour management of active medical problems listed below. Physiatrist and rehab team continue to assess barriers to discharge/monitor patient progress toward functional and medical goals.   FIM: Function - Bathing Position: Sitting EOB Body parts bathed by patient: Right upper leg, Left upper leg Body parts bathed by helper: Right arm, Left arm, Chest, Abdomen, Front perineal area, Buttocks, Right upper leg, Left upper leg, Right lower leg, Left lower leg, Back Bathing not applicable: Abdomen, Front perineal area, Buttocks, Right upper leg, Left upper leg, Right lower leg, Left lower leg Assist Level: 2 helpers  Function- Upper Body Dressing/Undressing What is the patient wearing?: Pull over shirt/dress Pull over shirt/dress - Perfomed by helper: Thread/unthread right sleeve, Put head through opening, Pull shirt over trunk, Thread/unthread left sleeve Assist Level: Set up, Supervision or verbal cues (Total assist) Set up : To obtain clothing/put away Function - Lower Body Dressing/Undressing What is the patient wearing?: Non-skid slipper socks, Pants Position: Sitting EOB Pants- Performed by helper: Thread/unthread right pants leg, Thread/unthread left pants leg, Pull pants up/down Non-skid slipper socks- Performed by helper: Don/doff right sock, Don/doff left sock Assist Level: 2 Helpers  Function - Toileting Toileting activity did not occur: No continent bowel/bladder event Toileting steps completed by helper: Adjust clothing prior to toileting, Adjust clothing after toileting, Performs perineal  hygiene Toileting Assistive Devices: Other (comment) (UE support on sink) Assist level: Two helpers  Function - Air cabin crew transfer activity did not occur: Safety/medical concerns Toilet transfer assistive device: Drop arm commode Assist level to toilet: 2 helpers Assist level from toilet: 2 helpers  Function - Chair/bed transfer Chair/bed transfer method: Stand pivot Chair/bed transfer assist level: Maximal assist (Pt 25 - 49%/lift and lower) Chair/bed transfer assistive device: Armrests, Bedrails Chair/bed transfer details: Visual cues/gestures for sequencing, Manual facilitation for weight shifting, Manual facilitation for placement, Tactile cues for weight shifting  Function - Locomotion: Wheelchair Will patient use wheelchair at discharge?: Yes Wheelchair activity  did not occur:  (Attempted hand-over-hand facilitation for w/c propulsion, visual demonstration, however no pt initiation evident) Assist Level: Dependent (Pt equals 0%) Function - Locomotion: Ambulation Ambulation activity did not occur: Safety/medical concerns (Attempted ambulation, however upon standing x3 reps pt unable to step (unable to determine if due to \apraxia or pt fearful)) Assistive device:  (3 muskateers) Max distance: 150 Assist level: 2 helpers Walk 10 feet activity did not occur: Safety/medical concerns Assist level: 2 helpers Walk 50 feet with 2 turns activity did not occur: Safety/medical concerns Assist level: 2 helpers Walk 150 feet activity did not occur: Safety/medical concerns Assist level: 2 helpers Walk 10 feet on uneven surfaces activity did not occur: Safety/medical concerns  Function - Comprehension Comprehension: Auditory Comprehension assist level: Understands basic less than 25% of the time/ requires cueing >75% of the time  Function - Expression Expression: Nonverbal Expression assist level: Expresses basis less than 25% of the time/requires cueing >75% of the  time.  Function - Social Interaction Social Interaction assist level: Interacts appropriately less than 25% of the time. May be withdrawn or combative.  Function - Problem Solving Problem solving assist level: Solves basic less than 25% of the time - needs direction nearly all the time or does not effectively solve problems and may need a restraint for safety  Function - Memory Memory assist level: Recognizes or recalls less than 25% of the time/requires cueing greater than 75% of the time Patient normally able to recall (first 3 days only): None of the above  Medical Problem List and Plan: 1. Functional deficits secondary to left MCA infarct/respiratory failure. Extubated 11/20/2014. RR tends to be elevated but sats ok, wheezing will schedule nebs 2. DVT Prophylaxis/Anticoagulation: SCDs. Monitor for any signs of DVT. 8/12 venous dopplers neg 3. Pain Management: Tylenol as needed, no apparent pain during PT 4. Dysphagia. Patient is nothing by mouth. Status post gastrostomy tube 08/12/2014 per interventional radiology for nutritional support. poor prognosis for improvement given severe bilateral deficits, do not anticipate d/c PEG during this admission, discussed with SLP not to spend much time with swallow   5. Neuropsych: This patient is not capable of making decisions on her own behalf. 6. Skin/Wound Care: Routine skin checks. PEG site maintenance. continue turning and nutrition 7. Fluids/Electrolytes/Nutrition: Routine I&O. Adjust TF based on nutritional needs. Renal adjusting dialysis, complicated situation, Not a diabetic, getting CBGs because of TF, these have been normal 8. End-stage renal disease with hemodialysis. Follow-up per renal services. Hemodialysis in PM after therapies to avoid conflict, Nepro for nutrition, last K was normal at 4.1 9. Seizure prophylaxis. Keppra 500 mg twice a day, monitor drug toxicity. No seizures, continue current dose no evidence of excessive  sedation 10. Hypothyroidism. Synthroid 11. Chronic anemia. Aranesp weekly with dialysis, hgb monitored in HD 12. History of asthma. Schedule nebulizers given aphasia. Anxiety component but this aspect is improving, no wheezing today! 13. Mood/anxiety. Continue Xanax on dialysis days 14.  Hx of recurrent resp failure, high risk for PNA given comorbid conditions,  RR increases at times. Comfortable. Secretions better. Afebrile. No distress.  -continue to aggressively monitor, aspiration precautions,  -wean oxygen down to keep sats >90% 15.  Bowel incont- monitor skin 16.  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

## 2014-12-07 NOTE — Progress Notes (Signed)
Physical Therapy Session Note  Patient Details  Name: [name] MRN: [id number] Date of Birth: Sep 29, 1955  Today's Date: 12/07/2014 PT Individual Time: 0900-1000 PT Individual Time Calculation (min): 60 min   Short Term Goals: Week 2:  PT Short Term Goal 1 (Week 2): Pt will perform bed mobility minA and min cues, with bedrails PT Short Term Goal 2 (Week 2): Will perform stand pivot transfer w/c <> bed with consistent minA and min cues PT Short Term Goal 3 (Week 2): Pt will perform ambulation x50' and modA +1 PT Short Term Goal 4 (Week 2): Pt will perform ascent/descent of 8 3-inch stairs with maxA +1 PT Short Term Goal 5 (Week 2): Pt will perform w/c propulsion x50' with modA and max verbal cues  Skilled Therapeutic Interventions/Progress Updates:    Pt received seated in w/c; no apparent signs of pain or distress, however pt unable to verbalize. SLP from previous session reported to therapist that pt states pain in L shoulder and RN aware. Pt received breathing treatment during first 10 minutes of treatment. While seated in w/c receiving treatment, pt engaged in cognitive activity of identifying and reaching for various objects including colored cups, choosing the higher card between two options, etc. Pt demonstrates improving initiation with reaching, object identification, and command following with less cueing needed to complete task. Able to identify accurate object on first try with >75% accuracy. Pt engaged in personal care while seated at sink. Pt turns on water, wet washcloth and cleaned face with mod/max repetitive cues and increased time to complete but again with improving initiation and participation. Sit <>stand at sink with UE support on sink x2 trials for approximately 2-3 minutes each. Pt engaged in hair brushing with LUE with min cues; however once using LUE for functional task, and weaker RUE supporting on sink pt, requires increased assistance of mod/maxA to maintain balance.  Pt declines gait training at this time; educated in importance of mobility to increase strength, however pt continues to shake head no when prompted to stand and walk. Pt cued to use tissue or suction after productive cough; pt actively reaches for suction and assist therapist with oral care. Pt instructed in use of call bell system; pt demonstrates with min cues, improving over previous sessions. Pt left seated in TIS w/c with QR belt intact and all needs within reach.   Therapy Documentation Precautions:  Precautions Precautions: Fall Precaution Comments: fall, NPO, pusher to the right at times, non-verbal,  Restrictions Weight Bearing Restrictions: No  Pain: Pain Assessment Pain Assessment: 0-10 Pain Score: 6  Faces Pain Scale: No hurt Pain Type: Acute pain Pain Location: Shoulder Pain Orientation: Left Pain Onset: On-going Pain Intervention(s): RN made aware Multiple Pain Sites: No   Function:    Bed Mobility    Sit to lying activity   Lying to sitting activity        Mobility details Bed mobility details: Manual facilitation for placement;Verbal cues for safe use of DME/AE;Verbal cues for sequencing   Transfers Sit to stand transfer   Sit to stand assist level: Maximal assist (Pt 25 - 49%/lift and lower) Sit to stand assistive device: Other (UE support on sink)  Chair/bed transfer      Toilet transfer                Car transfer          Therapy/Group: Individual Therapy  [name] 12/07/2014, 12:59 PM

## 2014-12-07 NOTE — Plan of Care (Signed)
Problem: RH SKIN INTEGRITY Goal: RH STG SKIN FREE OF INFECTION/BREAKDOWN With Mod. Assisst  Outcome: Not Progressing Poor initiation, total assist. Goal: RH STG MAINTAIN SKIN INTEGRITY WITH ASSISTANCE STG Maintain Skin Integrity With Mod. Assistance.  Outcome: Not Progressing Poor initiation, total assist.

## 2014-12-07 NOTE — Progress Notes (Signed)
Occupational Therapy Session Note  Patient Details  Name: [name] MRN: [id number] Date of Birth: 03/10/1956  Today's Date: 12/07/2014 OT Individual Time: 1030-1200 OT Individual Time Calculation (min): 90 min   Short Term Goals: Week 2:  OT Short Term Goal 1 (Week 2): Pt will complete 2 grooming tasks with no more than mod demonstrational cueing for initiation and completion. OT Short Term Goal 2 (Week 2): Pt will perform UB bathing with mod assist sitting supported. OT Short Term Goal 3 (Week 2): Pt will use the RUE for washing face or UB with no more than mod assist. OT Short Term Goal 4 (Week 2): Pt will perform toilet transfer stand pivot with no more than mod assist.   Skilled Therapeutic Interventions/Progress Updates: ADL-retraining at sink, sitting/standing from w/c, with focus on improved initiation, sequencing, and functional use of RUE during assisted BADL.   Pt received in tilt-in-space w/c, alert and responsive to therapist with pt using facial expressions, eye contact and head nods to communicate.   OT provided setup for bathing at sink with encouragement for patient to participate through progression of bathing from head to toes, overall total assist for thoroughness.   Pt received wash cloth and able to wash stomach, chest and upper legs (approx 20%) with max vc to continue.   Pt able rise from w/c to supported standing at sink with mod assist to stand and min guard to maintain standing balance as therapist washed her buttocks and RN observing.   Pt required transfer to bed to wash peri-area.   RN specialist consulted for inspection of peri-area skin integrity.   Pt remained in bed at end of session with all needs within reach.    Therapy Documentation Precautions:  Precautions Precautions: Fall Precaution Comments: fall, NPO, pusher to the right at times, non-verbal,  Restrictions Weight Bearing Restrictions: No  Vital Signs: Therapy Vitals Pulse Rate: 92 Resp:  18 Patient Position (if appropriate): Sitting Oxygen Therapy SpO2: 95 % O2 Device: Nasal Cannula O2 Flow Rate (L/min): 3 L/min   Pain: Pain Assessment Pain Assessment: Faces Faces Pain Scale: No hurt  Function:   Grooming Oral Care,Brush Teeth, Clean Dentures Activity:             Wash, Rinse, Dry Face Activity   Assist Level: Touching or steadying assistance(Pt > 75%)      Wash, Rinse, Dry Hands Activity   Assist Level: Touching or steadying assistance(Pt > 75%)      Brush, Comb Hair Activity   Assist Level: Touching or steadying assistance(Pt > 75%)    Shave Activity          Apply Makeup Activity                                                             Bathing Bathing position   Position: Wheelchair/chair at sink  Bathing parts   Body parts bathed by helper: Right arm;Left arm;Chest;Abdomen;Front perineal area;Buttocks;Right lower leg;Left lower leg;Back  Bathing assist Assist Level: Touching or steadying assistance(Pt > 75%)       Upper Body Dressing/Undressing Upper body dressing   What is the patient wearing?: Pull over shirt/dress       Pull over shirt/dress - Perfomed by helper: Thread/unthread right sleeve;Thread/unthread left sleeve;Put head through opening;Pull  shirt over trunk        Upper body assist Assist Level: Touching or steadying assistance(Pt > 75%)       Bed Mobility Roll left and right activity   Assist level: Touching or steadying assistance (Pt > 75%, lift 1 leg) Roll left and right assistive device: Bedrails  Sit to lying activity   Assist level: Mod assist (Pt 50 - 74%, lift 2 legs) Sit to lying assistive device: Bedrails  Lying to sitting activity        Mobility details Bed mobility details: Manual facilitation for placement;Verbal cues for safe use of DME/AE;Verbal cues for sequencing    Transfers Sit to stand transfer        Chair/bed transfer   Chair/bed transfer method: Stand pivot Chair/bed transfer  assist level: Moderate assist (Pt 50 - 74%/lift or lower)     Chair/bed transfer details: Manual facilitation for weight shifting;Verbal cues for sequencing  Toilet transfer                Tub/shower transfer             Therapy/Group: Individual Therapy  Menno Vanbergen 12/07/2014, 12:26 PM

## 2014-12-07 NOTE — Progress Notes (Signed)
Subjective:   In wheelchair. Aphasia- nods head.   Objective Filed Vitals:   12/06/14 1951 12/07/14 0120 12/07/14 0620 12/07/14 0911  BP:   125/73   Pulse:   92 92  Temp:   97.7 F (36.5 C)   TempSrc:   Oral   Resp:   19 18  Weight:   73.5 kg (162 lb 0.6 oz)   SpO2: 95% 96% 97% 95%   Physical Exam General: sitting in wheelchair, no obvious complaints. Unable to answer questions d/t aphasia Heart: RRR  Lungs: CTA, unlabored, shallow Abdomen: soft, nontender +BS  Extremities: no edema Dialysis Access:   TTS [facility] 4h 2/2 Bath 91kg Heparin none Hect 2 ug Aranesp 200 ug/wk + venofer 50/wk  Assessment/Plan: 1. CVA- with R hemiparesis and aphasia.- now in rehab PT/OT 2. ESRD - TTS, off schedule- HD pending today.  3. Anemia -  Hbg 9.5- aranesp q tuesday 4. Secondary hyperparathyroidism - phos 4.3- fosrenol/Ca+ 10.1 corrected- hectorol on hold  5. HTN/volume - SBP 120s-130s/ wt down significantly since admission 6. Nutrition -  NPO. Alb 2.5. nepro per gtube/ vitamin/ 7. Hx PAT - no documented hx of afib. Not on any medication for this now.  Jetty Duhamel, NP [facility] Beeper [phone] 12/07/2014,10:48 AM  LOS: 12 days   Pt seen, examined and agree w A/P as above.  [name] MD pager [phone]    cell [phone] 12/07/2014, 2:12 PM    Additional Objective Labs: Basic Metabolic Panel:  Recent Labs Lab 12/01/14 2303 12/02/14 1857 12/04/14 1958  NA  --  134* 134*  K 4.2 4.7 4.1  CL  --  92* 92*  CO2  --  27 29  GLUCOSE  --  80 93  BUN  --  76* 57*  CREATININE  --  7.92* 6.93*  CALCIUM  --  9.4 9.0  PHOS  --  4.4 4.3   Liver Function Tests:  Recent Labs Lab 12/02/14 1857 12/04/14 1958  ALBUMIN 2.6* 2.5*   No results for input(s): LIPASE, AMYLASE in the last 168 hours. CBC:  Recent Labs Lab 12/02/14 1901 12/04/14 1958  WBC 9.2 8.2  HGB 9.6* 9.5*  HCT 30.6* 30.3*  MCV 85.2 86.8  PLT 324 321   Blood Culture     Component Value Date/Time   SDES ABSCESS PELVIS 08/17/2014 1451   SPECREQUEST NONE 08/17/2014 1451   CULT  08/17/2014 1451    NO GROWTH 3 DAYS Performed at [facility]    REPTSTATUS 08/21/2014 FINAL 08/17/2014 1451    Cardiac Enzymes: No results for input(s): CKTOTAL, CKMB, CKMBINDEX, TROPONINI in the last 168 hours. CBG:  Recent Labs Lab 12/06/14 0609 12/06/14 1140 12/06/14 1802 12/06/14 2358 12/07/14 0624  GLUCAP 117* 92 96 85 92   Iron Studies: No results for input(s): IRON, TIBC, TRANSFERRIN, FERRITIN in the last 72 hours. @lablastinr3 @ Studies/Results: No results found. Medications: . feeding supplement (NEPRO CARB STEADY) Stopped (12/07/14 0804)   . ALPRAZolam  0.5 mg Per Tube Q T,Th,Sa-HD  . antiseptic oral rinse  7 mL Mouth Rinse QID  . aspirin  325 mg Oral Daily  . budesonide  0.25 mg Nebulization BID  . chlorhexidine  15 mL Mouth/Throat BID  . [START ON 12/08/2014] darbepoetin (ARANESP) injection - DIALYSIS  100 mcg Intravenous Q Tue-HD  . free water  50 mL Per Tube BID  . guaifenesin  200 mg Per Tube TID  . ipratropium-albuterol  3 mL Nebulization Q6H  .  lanthanum  500 mg Per Tube TID  . levETIRAcetam  500 mg Oral BID  . levothyroxine  25 mcg Oral QAC breakfast  . methylphenidate  5 mg Per Tube BID WC  . mirtazapine  7.5 mg Per Tube QHS  . multivitamin  1 tablet Oral QHS  . pantoprazole sodium  40 mg Per Tube Q1200

## 2014-12-07 NOTE — Progress Notes (Signed)
Speech Language Pathology Daily Session Note  Patient Details  Name: [name] MRN: [id number] Date of Birth: 09/12/1955  Today's Date: 12/07/2014 SLP Individual Time: 0800-0900 SLP Individual Time Calculation (min): 60 min  Short Term Goals: Week 2: SLP Short Term Goal 1 (Week 2): Pt will improve management of her secretions as evidenced by ability to orally expectorate secretions and anteriorly contain her saliva with max assist multimodal cues.   SLP Short Term Goal 2 (Week 2): Pt will consume PO trials with max assist multimodal cues to clear boluses from the oral cavity and to initiate swallow in a timely manner over 3 consecutive sessions prior to completion of objective swallow study.  SLP Short Term Goal 3 (Week 2): Pt will initiate a basic,familiar task with max assist multimodal cues over 75% of observable opportunities.  SLP Short Term Goal 4 (Week 2): Pt will visually scan to midline in >25% of observable opportunities during basic, functional tasks with max assist multimodal cues.  SLP Short Term Goal 5 (Week 2): Pt will vocalize to produce vowels in isolation with max assist multimodal cues.    Skilled Therapeutic Interventions: Skilled treatment session focused on addressing cognitive-linguistic goals. SLP facilitated session by providing Mod assist multimodal cues for initiation of head nods for yes/no questions regarding biographical and immediate environment info; patient was 66% accuracy with her responses.  Despite Max assist multimodal cues to couple head nods with vocalizations patient was unable to produce them.  Patient also initiated sorting items during a structured task with hand over hand assist faded to Min visual cues.  During the structured tasks she scanned to midline to locate items with use of a visual marker and Min verbal/visual cues.  Continue with current plan of care.   Function:  Cognition Comprehension Comprehension assist level: Understands basic 50  - 74% of the time/ requires cueing 25 - 49% of the time  Expression Expression assistive device: Other (Comment) (head nods) Expression assist level: Expresses basic 25 - 49% of the time/requires cueing 50 - 75% of the time. Uses single words/gestures.  Social Interaction Social Interaction assist level: Interacts appropriately 25 - 49% of time - Needs frequent redirection.  Problem Solving Problem solving assist level: Solves basic less than 25% of the time - needs direction nearly all the time or does not effectively solve problems and may need a restraint for safety  Memory Memory assist level: Recognizes or recalls less than 25% of the time/requires cueing greater than 75% of the time    Pain Pain Assessment Pain Assessment: 0-10 Pain Score: 6  Faces Pain Scale: No hurt Pain Type: Acute pain Pain Location: Shoulder Pain Orientation: Left Pain Onset: On-going Pain Intervention(s): RN made aware Multiple Pain Sites: No  Therapy/Group: Individual Therapy  [name]., CCC-SLP [phone]  [name] 12/07/2014, 12:38 PM

## 2014-12-08 ENCOUNTER — Inpatient Hospital Stay (HOSPITAL_COMMUNITY): Payer: Medicaid Other | Admitting: Speech Pathology

## 2014-12-08 ENCOUNTER — Inpatient Hospital Stay (HOSPITAL_COMMUNITY): Payer: Medicaid Other | Admitting: Occupational Therapy

## 2014-12-08 ENCOUNTER — Inpatient Hospital Stay (HOSPITAL_COMMUNITY): Payer: Medicaid Other | Admitting: Physical Therapy

## 2014-12-08 LAB — GLUCOSE, CAPILLARY
GLUCOSE-CAPILLARY: 73 mg/dL (ref 65–99)
GLUCOSE-CAPILLARY: 83 mg/dL (ref 65–99)
Glucose-Capillary: 100 mg/dL — ABNORMAL HIGH (ref 65–99)
Glucose-Capillary: 111 mg/dL — ABNORMAL HIGH (ref 65–99)

## 2014-12-08 MED ORDER — CETYLPYRIDINIUM CHLORIDE 0.05 % MT LIQD
7.0000 mL | Freq: Three times a day (TID) | OROMUCOSAL | Status: DC
  Administered 2014-12-09 – 2014-12-23 (×38): 7 mL via OROMUCOSAL

## 2014-12-08 MED ORDER — IPRATROPIUM-ALBUTEROL 0.5-2.5 (3) MG/3ML IN SOLN
3.0000 mL | Freq: Three times a day (TID) | RESPIRATORY_TRACT | Status: DC
  Administered 2014-12-09 – 2014-12-24 (×41): 3 mL via RESPIRATORY_TRACT
  Filled 2014-12-08 (×47): qty 3

## 2014-12-08 NOTE — Progress Notes (Signed)
Notified Kirsteins, MD of old abdominal wound with scab present and sutures holding scab in place. Area healed with old scab 'hanging' in place due to old sutures intact. MD aware and no new orders at this time.

## 2014-12-08 NOTE — Progress Notes (Signed)
Physical Therapy Session Note  Patient Details  Name: [name] MRN: [id number] Date of Birth: 29-Jan-1956  Today's Date: 12/08/2014 PT Individual Time: 0945-1100 PT Individual Time Calculation (min): 75 min   Short Term Goals: Week 2:  PT Short Term Goal 1 (Week 2): Pt will perform bed mobility minA and min cues, with bedrails PT Short Term Goal 2 (Week 2): Will perform stand pivot transfer w/c <> bed with consistent minA and min cues PT Short Term Goal 3 (Week 2): Pt will perform ambulation x50' and modA +1 PT Short Term Goal 4 (Week 2): Pt will perform ascent/descent of 8 3-inch stairs with maxA +1 PT Short Term Goal 5 (Week 2): Pt will perform w/c propulsion x50' with modA and max verbal cues  Skilled Therapeutic Interventions/Progress Updates:    Pt received seated in w/c; pt non-verbal with no evidence of pain; see below for PAINAD scale. Pt on 3L o2 via [place] throughout session. Gait training with +2 HHA for two trials, 60' and 30'. Improved initiation of task when pt cued to begin walking; sit >stand maxA both trials however pt places hands on arm rests without cueing. Difficulty with turning and backing up to w/c, 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, and pt requires facilitation for weight shifting throughout both gait trials. Seated cognitive activity performed to allow pt to rest LEs after gait training; cued pt for sorting bean bags by color with BUE. Initially required mod cues for placement of beanbags onto piles, however increased independence with repetition until pt requires only supervision and occasional cues. Increased time to perform due to apraxia, occasional pause or hesitation while distracted by surroundings and pictures on bags. Increased difficulty with locating/placing bags in piles in R field of vision due to inattention as well as RUE weakness, apraxia and motor impersistence, and requires increase cues/time. Standing frame x2 min; pt demonstrates pain/discomfort with facial  expressions however shakes head no when questioned about pain. Activity discontinued due to apparent discomfort. Pt cued for putting new pillow case on pillow; pt initiates taking pillow case and begins to arrange pillowcase in hands, however unable to progress further. Therapist threads pillow into pillow case, and pt able to pull with LUE to complete task, however requires increased time and assistance with R side due to decrease RUE use in functional task. Sit <>stand at parallel bars x 2 reps with UE support on bars; no assistance needed. Standing x approximately 2 min each trial with pt controlling stand >sit with UEs. O2 measured after standing at 92%; increased O2 to 4L for remainder of session. Pt returned to room remained seated in w/c with all needs within reach at completion of session, QR belt intact.  Therapy Documentation Precautions:  Precautions Precautions: Fall Precaution Comments: fall, NPO, pusher to the right at times, non-verbal,  Restrictions Weight Bearing Restrictions: No Pain: Pain Assessment Faces Pain Scale:  (unable to assess) Pain Type: Acute pain Pain Location: Generalized (difficult to assess due to aphasia) Pain Intervention(s): Medication (See eMAR) PAINAD (Pain Assessment in Advanced Dementia) Breathing: normal Negative Vocalization: none Facial Expression: sad, frightened, frown Body Language: relaxed Consolability: no need to console PAINAD Score: 1   Function:    Transfers Sit to stand transfer   Sit to stand assist level: Touching or steadying assistance (Pt > 75%/lift 1 leg) Sit to stand assistive device: Other (UEs pulling from parallel bars)  Chair/bed transfer               Toilet  Administrator, sports device: Hand held assist (+2 HHA) Max distance: 60 Assist level: 2 helpers  Walk 10 feet activity   Assist level: 2 helpers  Walk 50 feet with 2 turns activity       Walk 150 feet activity      Walk 10 feet on uneven surfaces activity      Stairs          Walk up/down 1 step activity        Walk up/down 4 steps activity      Walk up/down 12 steps activity      Pick up small objects from floor      Wheelchair          Wheel 50 feet with 2 turns activity      Wheel 150 feet activity        Therapy/Group: Individual Therapy  [name] 12/08/2014, 11:12 AM

## 2014-12-08 NOTE — Progress Notes (Signed)
Occupational Therapy Session Note  Patient Details  Name: [name] MRN: [id number] Date of Birth: [birth year]  Today's Date: 12/08/2014 OT Individual Time: 1100-1155 OT Individual Time Calculation (min): 55 min    Short Term Goals: Week 2:  OT Short Term Goal 1 (Week 2): Pt will complete 2 grooming tasks with no more than mod demonstrational cueing for initiation and completion. OT Short Term Goal 2 (Week 2): Pt will perform UB bathing with mod assist sitting supported. OT Short Term Goal 3 (Week 2): Pt will use the RUE for washing face or UB with no more than mod assist. OT Short Term Goal 4 (Week 2): Pt will perform toilet transfer stand pivot with no more than mod assist.   Skilled Therapeutic Interventions/Progress Updates:    ADL retraining with focus on initiation, sit <> stand, standing balance, and functional use of RUE.  Upon arrival pt utilizing call bell, with yes/no questions able to decipher that pt needed to toilet.  Performed stand pivot to [facility] with max assist for transfer, with pt demonstrating increased initiation for sit > stand but still requiring assist for forward weight shift to come to standing and then control to lower to sitting.  Pt unable to void or have BM, however completed bathing from [facility] with pt demonstrating increased initiation with washing BLE and sit > stand to perineal hygiene.  Pt attempted donning shirt with ability to thread Lt sleeve but with difficulty raising RUE from arm rest to fully thread Rt sleeve.  Overall noted increased initiation with tasks, however pt continues to require max-total assist with self-care tasks.  Therapy Documentation Precautions:  Precautions Precautions: Fall Precaution Comments: fall, NPO, pusher to the right at times, non-verbal,  Restrictions Weight Bearing Restrictions: No General:   Vital Signs: Oxygen Therapy SpO2: 96 % O2 Device: Nasal Cannula O2 Flow Rate (L/min): 3 L/min Pain: Pain Assessment Pain  Assessment: Faces Faces Pain Scale:  (unable to rate pain) Pain Location: Generalized (unable to verbalize location) Pain Intervention(s): Medication (See eMAR) PAINAD (Pain Assessment in Advanced Dementia) Breathing: normal Negative Vocalization: none Facial Expression: sad, frightened, frown Body Language: relaxed Consolability: no need to console PAINAD Score: 1  Function:   Grooming Oral Care,Brush Teeth, Clean Dentures Activity:             Wash, Rinse, Dry Face Activity   Assist Level: Set up   Set up : To obtain items  Wash, Rinse, Dry Hands Activity          Brush, Comb Hair Activity   Assist Level: Touching or steadying assistance(Pt > 75%)    Shave Activity          Apply Makeup Activity                                                             Bathing Bathing position   Position: Wheelchair/chair at sink  Bathing parts Body parts bathed by patient: Right upper leg;Left upper leg Body parts bathed by helper: Front perineal area;Buttocks;Right lower leg;Left lower leg;Back  Bathing assist Assist Level: Supervision or verbal cues;Touching or steadying assistance(Pt > 75%) (Mod assist for standing balance)       Upper Body Dressing/Undressing Upper body dressing   What is the patient wearing?: Pull over shirt/dress  Pull over shirt/dress - Perfomed by patient: Thread/unthread left sleeve Pull over shirt/dress - Perfomed by helper: Thread/unthread right sleeve;Put head through opening;Pull shirt over trunk        Upper body assist Assist Level: Touching or steadying assistance(Pt > 75%)       Lower Body Dressing/Undressing Lower body dressing   What is the patient wearing?: Non-skid slipper socks;Pants       Pants- Performed by helper: Thread/unthread right pants leg;Thread/unthread left pants leg;Pull pants up/down   Non-skid slipper socks- Performed by helper: Don/doff right sock;Don/doff left sock                  Lower body  assist Assist Level: Touching or steadying assistance (Pt > 75%)       Toileting Toileting     Toileting steps completed by helper: Adjust clothing prior to toileting;Adjust clothing after toileting;Performs perineal hygiene    Toileting assist Assist level: Touching or steadying assistance (Pt.75%)      Transfers Sit to stand transfer   Sit to stand assist level: Moderate assist (Pt 50 - 74%/lift 2 legs/lift or lower) Sit to stand assistive device: Other (UEs pulling from parallel bars)  Chair/bed transfer              Toilet transfer   Toilet transfer assistive device: Drop arm commode       Assist level to toilet: Maximal assist (Pt 25 - 49%/lift and lower) Assist level from toilet: Maximal assist (Pt 25 - 49%/lift and lower)  Tub/shower transfer             Cognition Comprehension Comprehension assist level: Understands basic 25 - 49% of the time/ requires cueing 50 - 75% of the time  Expression Expression assist level: Expresses basis less than 25% of the time/requires cueing >75% of the time.  Social Interaction Social Interaction assist level: Interacts appropriately 25 - 49% of time - Needs frequent redirection.  Problem Solving Problem solving assist level: Solves basic less than 25% of the time - needs direction nearly all the time or does not effectively solve problems and may need a restraint for safety  Memory Memory assist level: Recognizes or recalls less than 25% of the time/requires cueing greater than 75% of the time    Therapy/Group: Individual Therapy  Rosalio Loud 12/08/2014, 12:19 PM

## 2014-12-08 NOTE — Progress Notes (Signed)
Subjective/Complaints:  No issues overnite, tolerating therapy.  Working with SLP  ROS cannot obtain due to global aphasia   Objective: Vital Signs: Blood pressure 102/58, pulse 105, temperature 98.5 F (36.9 C), temperature source Oral, resp. rate 20, weight 74 kg (163 lb 2.3 oz), SpO2 95 %. No results found. Results for orders placed or performed during the hospital encounter of 11/25/14 (from the past 72 hour(s))  Glucose, capillary     Status: None   Collection Time: 12/05/14 11:46 AM  Result Value Ref Range   Glucose-Capillary 99 65 - 99 mg/dL  Glucose, capillary     Status: None   Collection Time: 12/05/14  6:28 PM  Result Value Ref Range   Glucose-Capillary 86 65 - 99 mg/dL  Glucose, capillary     Status: Abnormal   Collection Time: 12/05/14 11:50 PM  Result Value Ref Range   Glucose-Capillary 101 (H) 65 - 99 mg/dL  Glucose, capillary     Status: Abnormal   Collection Time: 12/06/14  6:09 AM  Result Value Ref Range   Glucose-Capillary 117 (H) 65 - 99 mg/dL  Glucose, capillary     Status: None   Collection Time: 12/06/14 11:40 AM  Result Value Ref Range   Glucose-Capillary 92 65 - 99 mg/dL  Glucose, capillary     Status: None   Collection Time: 12/06/14  6:02 PM  Result Value Ref Range   Glucose-Capillary 96 65 - 99 mg/dL  Glucose, capillary     Status: None   Collection Time: 12/06/14 11:58 PM  Result Value Ref Range   Glucose-Capillary 85 65 - 99 mg/dL  Glucose, capillary     Status: None   Collection Time: 12/07/14  6:24 AM  Result Value Ref Range   Glucose-Capillary 92 65 - 99 mg/dL  Glucose, capillary     Status: None   Collection Time: 12/07/14 12:02 PM  Result Value Ref Range   Glucose-Capillary 77 65 - 99 mg/dL  Glucose, capillary     Status: Abnormal   Collection Time: 12/07/14  6:05 PM  Result Value Ref Range   Glucose-Capillary 108 (H) 65 - 99 mg/dL  Renal function panel     Status: Abnormal   Collection Time: 12/07/14  8:34 PM  Result Value  Ref Range   Sodium 131 (L) 135 - 145 mmol/L   Potassium 3.9 3.5 - 5.1 mmol/L   Chloride 90 (L) 101 - 111 mmol/L   CO2 29 22 - 32 mmol/L   Glucose, Bld 92 65 - 99 mg/dL   BUN 58 (H) 6 - 20 mg/dL   Creatinine, Ser 6.25 (H) 0.44 - 1.00 mg/dL   Calcium 8.8 (L) 8.9 - 10.3 mg/dL   Phosphorus 3.8 2.5 - 4.6 mg/dL   Albumin 2.5 (L) 3.5 - 5.0 g/dL   GFR calc non Af Amer 7 (L) >60 mL/min   GFR calc Af Amer 8 (L) >60 mL/min    Comment: (NOTE) The eGFR has been calculated using the CKD EPI equation. This calculation has not been validated in all clinical situations. eGFR's persistently <60 mL/min signify possible Chronic Kidney Disease.    Anion gap 12 5 - 15  CBC     Status: Abnormal   Collection Time: 12/07/14  8:35 PM  Result Value Ref Range   WBC 2.9 (L) 4.0 - 10.5 K/uL   RBC 3.53 (L) 3.87 - 5.11 MIL/uL   Hemoglobin 9.6 (L) 12.0 - 15.0 g/dL   HCT 30.2 (L) 36.0 -  46.0 %   MCV 85.6 78.0 - 100.0 fL   MCH 27.2 26.0 - 34.0 pg   MCHC 31.8 30.0 - 36.0 g/dL   RDW 18.1 (H) 11.5 - 15.5 %   Platelets 297 150 - 400 K/uL  Glucose, capillary     Status: Abnormal   Collection Time: 12/08/14 12:12 AM  Result Value Ref Range   Glucose-Capillary 100 (H) 65 - 99 mg/dL  Glucose, capillary     Status: Abnormal   Collection Time: 12/08/14  6:17 AM  Result Value Ref Range   Glucose-Capillary 111 (H) 65 - 99 mg/dL     HEENT: poor oral movements  facial diplegia, cannot open mouth to command Cardio: RRR and no murmur Resp: CTA B/L.no use of accesory muscles, No wheezes and no upper airway sounds, not able to cough on command GI: BS positive and NT, ND, pior miline abd incision with extensive scarring Extremity:  Pulses positive and No Edema Skin:   Other G tube site NT Neuro: Flat but very alert, Cranial Nerve Abnormalities bilateral central 7,Aphasic and Apraxic, nodding for Y/N questions Musc/Skel:  Other decrease ROM in BIlateral knees and Right shoulder and elbow due to tone Gen  NAD   Assessment/Plan: 1. 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 Physiatrist is providing close team supervision and 24 hour management of active medical problems listed below. Physiatrist and rehab team continue to assess barriers to discharge/monitor patient progress toward functional and medical goals.  Team conf in am  FIM: Function - Bathing Position: Wheelchair/chair at sink Body parts bathed by patient: Right upper leg, Left upper leg Body parts bathed by helper: Right arm, Left arm, Chest, Abdomen, Front perineal area, Buttocks, Right lower leg, Left lower leg, Back Bathing not applicable: Abdomen, Front perineal area, Buttocks, Right upper leg, Left upper leg, Right lower leg, Left lower leg Assist Level: Touching or steadying assistance(Pt > 75%)  Function- Upper Body Dressing/Undressing What is the patient wearing?: Pull over shirt/dress Pull over shirt/dress - Perfomed by helper: Thread/unthread right sleeve, Thread/unthread left sleeve, Put head through opening, Pull shirt over trunk Assist Level: Touching or steadying assistance(Pt > 75%) Set up : To obtain clothing/put away Function - Lower Body Dressing/Undressing What is the patient wearing?: Non-skid slipper socks, Pants Position: Sitting EOB Pants- Performed by helper: Thread/unthread right pants leg, Thread/unthread left pants leg, Pull pants up/down Non-skid slipper socks- Performed by helper: Don/doff right sock, Don/doff left sock Assist Level: 2 Helpers  Function - Toileting Toileting activity did not occur: No continent bowel/bladder event Toileting steps completed by helper: Adjust clothing prior to toileting, Adjust clothing after toileting, Performs perineal hygiene Toileting Assistive  Devices: Other (comment) (UE support on sink) Assist level: Two helpers  Function - Air cabin crew transfer activity did not occur: Safety/medical concerns Toilet transfer assistive device: Drop arm commode Assist level to toilet: 2 helpers Assist level from toilet: 2 helpers  Function - Chair/bed transfer Chair/bed transfer method: Stand pivot Chair/bed transfer assist level: Moderate assist (Pt 50 - 74%/lift or lower) Chair/bed transfer assistive device: Armrests, Bedrails Chair/bed transfer details: Manual facilitation for weight shifting, Verbal cues for sequencing  Function - Locomotion: Wheelchair Will patient use wheelchair at discharge?: Yes Wheelchair activity did not occur:  (Attempted hand-over-hand facilitation for w/c propulsion, visual demonstration, however no pt initiation  evident) Assist Level: Dependent (Pt equals 0%) Function - Locomotion: Ambulation Ambulation activity did not occur: Safety/medical concerns (Attempted ambulation, however upon standing x3 reps pt unable to step (unable to determine if due to$Remove' \\apraxia'[id number]$  or pt fearful)) Assistive device:  (3 muskateers) Max distance: 150 Assist level: 2 helpers Walk 10 feet activity did not occur: Safety/medical concerns Assist level: 2 helpers Walk 50 feet with 2 turns activity did not occur: Safety/medical concerns Assist level: 2 helpers Walk 150 feet activity did not occur: Safety/medical concerns Assist level: 2 helpers Walk 10 feet on uneven surfaces activity did not occur: Safety/medical concerns  Function - Comprehension Comprehension: Auditory Comprehension assist level: Understands basic 25 - 49% of the time/ requires cueing 50 - 75% of the time  Function - Expression Expression: Nonverbal Expression assistive device: Other (Comment) (head nods) Expression assist level: Expresses basis less than 25% of the time/requires cueing >75% of the time.  Function - Social Interaction Social  Interaction assist level: Interacts appropriately 25 - 49% of time - Needs frequent redirection.  Function - Problem Solving Problem solving assist level: Solves basic less than 25% of the time - needs direction nearly all the time or does not effectively solve problems and may need a restraint for safety  Function - Memory Memory assist level: Recognizes or recalls less than 25% of the time/requires cueing greater than 75% of the time Patient normally able to recall (first 3 days only): None of the above  Medical Problem List and Plan: 1. Functional deficits secondary to left MCA infarct/respiratory failure. Extubated 11/20/2014. 2. DVT Prophylaxis/Anticoagulation: SCDs. Monitor for any signs of DVT. 8/12 venous dopplers neg 3. Pain Management: Tylenol as needed, no apparent pain during PT 4. Dysphagia. Patient is nothing by mouth. Status post gastrostomy tube 08/12/2014 per interventional radiology for nutritional support. poor prognosis for improvement given severe bilateral deficits, do not anticipate d/c PEG during this admission, discussed with SLP not to spend much time with swallow   5. Neuropsych: This patient is not capable of making decisions on her own behalf. 6. Skin/Wound Care: Routine skin checks. PEG site maintenance. continue turning and nutrition 7. Fluids/Electrolytes/Nutrition: Routine I&O. Adjust TF based on nutritional needs. Renal adjusting dialysis, complicated situation, Not a diabetic, getting CBGs because of TF, these have been normal, Dietary writing TF orders 8. End-stage renal disease with hemodialysis. Follow-up per renal services. Hemodialysis in PM after therapies to avoid conflict, Nepro for nutrition, last K was normal at 4.1 9. Seizure prophylaxis. Keppra 500 mg twice a day, monitor drug toxicity. No seizures, continue current dose no evidence of excessive sedation 10. Hypothyroidism. Synthroid 11. Chronic anemia. Aranesp weekly with dialysis, hgb monitored  in HD, nephro managing 12. History of asthma. Schedule nebulizers given aphasia. Anxiety component but this aspect is improving, no wheezing today, no volitional cough, some secretions  13. Mood/anxiety. Continue Xanax on dialysis days 14.  Hx of recurrent resp failure, high risk for PNA given comorbid conditions,  RR increases at times. Comfortable. Secretions better. Afebrile. No distress.  -continue to aggressively monitor, aspiration precautions,  -wean oxygen down to keep sats >90% 15.  Bowel incont- monitor skin 16.  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

## 2014-12-08 NOTE — Progress Notes (Signed)
Speech Language Pathology Daily Session Note  Patient Details  Name: [name] MRN: [id number] Date of Birth: Jun 07, 1955  Today's Date: 12/08/2014 SLP Individual Time: 0803-0900 SLP Individual Time Calculation (min): 57 min  Short Term Goals: Week 2: SLP Short Term Goal 1 (Week 2): Pt will improve management of her secretions as evidenced by ability to orally expectorate secretions and anteriorly contain her saliva with max assist multimodal cues.   SLP Short Term Goal 2 (Week 2): Pt will consume PO trials with max assist multimodal cues to clear boluses from the oral cavity and to initiate swallow in a timely manner over 3 consecutive sessions prior to completion of objective swallow study.  SLP Short Term Goal 3 (Week 2): Pt will initiate a basic,familiar task with max assist multimodal cues over 75% of observable opportunities.  SLP Short Term Goal 4 (Week 2): Pt will visually scan to midline in >25% of observable opportunities during basic, functional tasks with max assist multimodal cues.  SLP Short Term Goal 5 (Week 2): Pt will vocalize to produce vowels in isolation with max assist multimodal cues.    Skilled Therapeutic Interventions:  Pt was seen for skilled ST targeting goals for dysphagia and cognition.  Upon arrival, pt was seated upright in wheelchair, awake, alert, and indicated agreement to participate in therapy with head nod.  Pt animatedly nodded head "yes" when she was offered ice chips and she demonstrated improved initiation to initiate and complete oral care in a timely manner.  Pt still required max assist multimodal cues to brush her teeth with a suction toothette and SLP followed behind for thoroughness to maximize swallowing safety with trials.  Pt consumed 5 trials of ice chips and demonstrated immediate wet, congested cough on 2 trials.  Pt was noted with improved timeliness of swallow initiation but do not recommend PO advancement at this time due to persistent s/s of  aspiration.  SLP also facilitated the session with a basic calendar task targeting sustained attention, visual scanning to the right, and functional problem solving.  Pt placed number cards into a large wall calendar with set up and min assist verbal and visual cues.  Pt visually scanned between the left and right of the calendar with min assist verbal and visual cues and increased processing time.  Pt remains nonverbal and was unable to name simple numbers despite max assist multimodal cues from SLP; however, pt was stimulable for some emerging approximation of initial consonants with tactile cues.  Pt was returned to room and left upright in wheelchair with quick release belt donned and call bell within reach.  Continue per current plan of care.    Function:  Eating Eating     Eating Assist Level: Help with picking up utensils;Help managing cup/glass;Helper scoops food on utensil (with trials )     Helper Scoops Food on Utensil: Every scoop     Cognition Comprehension Comprehension assist level: Understands basic 50 - 74% of the time/ requires cueing 25 - 49% of the time  Expression   Expression assist level: Expresses basis less than 25% of the time/requires cueing >75% of the time.  Social Interaction Social Interaction assist level: Interacts appropriately 25 - 49% of time - Needs frequent redirection.  Problem Solving Problem solving assist level: Solves basic less than 25% of the time - needs direction nearly all the time or does not effectively solve problems and may need a restraint for safety  Memory Memory assist level: Recognizes or recalls  less than 25% of the time/requires cueing greater than 75% of the time    Pain Pain Assessment Pain Assessment: Faces Faces Pain Scale: No hurt   Therapy/Group: Individual Therapy  Undrea Shipes, Melanee Spry 12/08/2014, 12:40 PM

## 2014-12-09 ENCOUNTER — Inpatient Hospital Stay (HOSPITAL_COMMUNITY): Payer: Medicaid Other | Admitting: Physical Therapy

## 2014-12-09 ENCOUNTER — Inpatient Hospital Stay (HOSPITAL_COMMUNITY): Payer: Medicaid Other | Admitting: Occupational Therapy

## 2014-12-09 ENCOUNTER — Inpatient Hospital Stay (HOSPITAL_COMMUNITY): Payer: Medicaid Other | Admitting: Speech Pathology

## 2014-12-09 LAB — CBC
HEMATOCRIT: 30.5 % — AB (ref 36.0–46.0)
HEMOGLOBIN: 9.7 g/dL — AB (ref 12.0–15.0)
MCH: 27.2 pg (ref 26.0–34.0)
MCHC: 31.8 g/dL (ref 30.0–36.0)
MCV: 85.7 fL (ref 78.0–100.0)
Platelets: 326 10*3/uL (ref 150–400)
RBC: 3.56 MIL/uL — ABNORMAL LOW (ref 3.87–5.11)
RDW: 18 % — ABNORMAL HIGH (ref 11.5–15.5)
WBC: 5.7 10*3/uL (ref 4.0–10.5)

## 2014-12-09 LAB — RENAL FUNCTION PANEL
ALBUMIN: 2.5 g/dL — AB (ref 3.5–5.0)
ANION GAP: 15 (ref 5–15)
BUN: 59 mg/dL — ABNORMAL HIGH (ref 6–20)
CALCIUM: 9.4 mg/dL (ref 8.9–10.3)
CO2: 28 mmol/L (ref 22–32)
Chloride: 86 mmol/L — ABNORMAL LOW (ref 101–111)
Creatinine, Ser: 6.6 mg/dL — ABNORMAL HIGH (ref 0.44–1.00)
GFR, EST AFRICAN AMERICAN: 7 mL/min — AB (ref >60)
GFR, EST NON AFRICAN AMERICAN: 6 mL/min — AB (ref >60)
Glucose, Bld: 73 mg/dL (ref 65–99)
PHOSPHORUS: 4.1 mg/dL (ref 2.5–4.6)
POTASSIUM: 4.1 mmol/L (ref 3.5–5.1)
Sodium: 129 mmol/L — ABNORMAL LOW (ref 135–145)

## 2014-12-09 LAB — GLUCOSE, CAPILLARY
GLUCOSE-CAPILLARY: 83 mg/dL (ref 65–99)
GLUCOSE-CAPILLARY: 97 mg/dL (ref 65–99)
Glucose-Capillary: 94 mg/dL (ref 65–99)
Glucose-Capillary: 71 mg/dL (ref 65–99)

## 2014-12-09 MED ORDER — ALTEPLASE 2 MG IJ SOLR
2.0000 mg | Freq: Once | INTRAMUSCULAR | Status: DC | PRN
  Filled 2014-12-09: qty 2

## 2014-12-09 MED ORDER — SODIUM CHLORIDE 0.9 % IV SOLN: 100.0000 mL | INTRAVENOUS | Status: DC | PRN

## 2014-12-09 MED ORDER — METHYLPHENIDATE HCL 5 MG PO TABS
10.0000 mg | ORAL_TABLET | Freq: Two times a day (BID) | ORAL | Status: DC
  Administered 2014-12-09 – 2014-12-24 (×29): 10 mg
  Filled 2014-12-09 (×29): qty 2

## 2014-12-09 MED ORDER — FREE WATER
50.0000 mL | Freq: Three times a day (TID) | Status: DC
  Administered 2014-12-09 – 2014-12-23 (×42): 50 mL

## 2014-12-09 MED ORDER — LIDOCAINE-PRILOCAINE 2.5-2.5 % EX CREA: 1.0000 "application " | TOPICAL_CREAM | CUTANEOUS | Status: DC | PRN

## 2014-12-09 MED ORDER — NEPRO/CARBSTEADY PO LIQD
1000.0000 mL | ORAL | Status: DC
  Administered 2014-12-10 – 2014-12-23 (×15): 1000 mL
  Filled 2014-12-09 (×34): qty 1000

## 2014-12-09 MED ORDER — PENTAFLUOROPROP-TETRAFLUOROETH EX AERO: 1.0000 "application " | INHALATION_SPRAY | CUTANEOUS | Status: DC | PRN

## 2014-12-09 MED ORDER — LIDOCAINE HCL (PF) 1 % IJ SOLN: 5.0000 mL | INTRAMUSCULAR | Status: DC | PRN

## 2014-12-09 MED ORDER — DARBEPOETIN ALFA 100 MCG/0.5ML IJ SOSY
100.0000 ug | PREFILLED_SYRINGE | INTRAMUSCULAR | Status: DC
  Administered 2014-12-09: 100 ug via INTRAVENOUS
  Filled 2014-12-09: qty 0.5

## 2014-12-09 MED ORDER — ALTEPLASE 2 MG IJ SOLR: 2.0000 mg | Freq: Once | INTRAMUSCULAR | Status: DC | PRN

## 2014-12-09 MED ORDER — ALPRAZOLAM 0.5 MG PO TABS
0.5000 mg | ORAL_TABLET | ORAL | Status: DC
  Administered 2014-12-09 – 2014-12-11 (×2): 0.5 mg
  Filled 2014-12-09: qty 1

## 2014-12-09 MED ORDER — HEPARIN SODIUM (PORCINE) 1000 UNIT/ML DIALYSIS
1000.0000 [IU] | INTRAMUSCULAR | Status: DC | PRN
  Filled 2014-12-09: qty 1

## 2014-12-09 MED ORDER — HEPARIN SODIUM (PORCINE) 1000 UNIT/ML DIALYSIS: 1000.0000 [IU] | INTRAMUSCULAR | Status: DC | PRN

## 2014-12-09 NOTE — Progress Notes (Signed)
Social Work [name], female   DOB: [birth year], 59 y.o.   MRN: [id number] Met with pt and daughter who was here to discuss team conference her progress toward her goals and the discharge 9/17.  Daughter realizes pt's care if much more than last time. She and her brother are still planning to take Mom home and provide 24 hr care. Will need to come in next week to begin training and this may take a few days to do. MD seeing if can change to bolus  Feeds or atleast night time feeds. Daughter is very committed to [facility] and will do whatever it takes for her and pt smiles and nods her head when talking about going home. Daughter states: " Do you want to go home Mom?" Pt shakes her head yes.  Will work on discharge plans and daughter to begin family training.

## 2014-12-09 NOTE — Plan of Care (Signed)
Problem: RH SKIN INTEGRITY Goal: RH STG MAINTAIN SKIN INTEGRITY WITH ASSISTANCE STG Maintain Skin Integrity With Mod. Assistance.  Outcome: Progressing Use of barrier cream

## 2014-12-09 NOTE — Progress Notes (Signed)
Occupational Therapy Session Note  Patient Details  Name: [name] MRN: [id number] Date of Birth: [birth year]  Today's Date: 12/09/2014 OT Individual Time: 0935-1100 OT Individual Time Calculation (min): 85 min    Short Term Goals: Week 2:  OT Short Term Goal 1 (Week 2): Pt will complete 2 grooming tasks with no more than mod demonstrational cueing for initiation and completion. OT Short Term Goal 2 (Week 2): Pt will perform UB bathing with mod assist sitting supported. OT Short Term Goal 3 (Week 2): Pt will use the RUE for washing face or UB with no more than mod assist. OT Short Term Goal 4 (Week 2): Pt will perform toilet transfer stand pivot with no more than mod assist.   Skilled Therapeutic Interventions/Progress Updates:    ADL retraining with focus on increased initiation and sequencing with bathing and dressing tasks.  Bathing completed at sink with pt demonstrating increased initiation with washing face when handed wash cloth with one verbal cue.  Pt with increased initiation with donning shirt with ability to thread LUE and use of BUE to pull over head, however requiring assist to fully pull shirt over head.  Noted increased initiation with sit > stand at sink with tactile cues for forward weight shift and to place hands on arm rests to push up with pt progressing from mod-min during session.  LB hygiene and dressing completed in standing with pt tolerating standing with UE support on sink and no additional assist for 3 mins.  Utilized Dyn[employer]ision with focus on initiation and scanning to Rt visual field. Mode A with pt reaction time 10, 5.22, and 5.22 seconds, speed impaired by pt attempting to utilize RUE and with decreased scanning to Rt.  Mode B set to 3 second limitation with pt correcting selecting 20/38 with reaction time of 1.88 seconds.  Reaction time 1.76 in both Lt upper and lower quadrant and 2.06 and 2.13 on Rt.  Pt will continue to benefit from OT to address RUE weakness  and scanning to Rt visual field.  Therapy Documentation Precautions:  Precautions Precautions: Fall Precaution Comments: fall, NPO, pusher to the right at times, non-verbal,  Restrictions Weight Bearing Restrictions: No General:   Vital Signs: Oxygen Therapy SpO2: 93 % O2 Device: Nasal Cannula O2 Flow Rate (L/min): 3 L/min Pain: PAINAD (Pain Assessment in Advanced Dementia) Breathing: normal Negative Vocalization: none Facial Expression: sad, frightened, frown Body Language: relaxed Consolability: no need to console PAINAD Score: 1  See Function Navigator for Current Functional Status.   Therapy/Group: Individual Therapy  Rosalio Loud 12/09/2014, 10:52 AM

## 2014-12-09 NOTE — Patient Care Conference (Signed)
Inpatient RehabilitationTeam Conference and Plan of Care Update Date: 12/09/2014   Time: 11:00 AM    Patient Name: [name]      Medical Record Number: [id number]  Date of Birth: January 06, 1956 Sex: Female         Room/Bed: 4W22C/4W22C-01 Payor Info: Payor: MEDICAID [place] / Plan: MEDICAID [place] ACCESS / Product Type: *No Product type* /    Admitting Diagnosis: L MCA infarct  old cva  Admit Date/Time:  11/25/2014  4:48 PM Admission Comments: No comment available   Primary Diagnosis:  Left middle cerebral artery stroke Principal Problem: Left middle cerebral artery stroke  Patient Active Problem List   Diagnosis Date Noted  . Left middle cerebral artery stroke 11/25/2014  . Right hemiparesis 11/25/2014  . Aphasia due to stroke 11/25/2014  . Acute respiratory failure   . Cerebral infarction due to embolism of left middle cerebral artery   . Gastrostomy tube in place   . PEG (percutaneous endoscopic gastrostomy) status   . Respiratory distress   . DNAR (do not attempt resuscitation) 11/20/2014  . Paroxysmal a-fib 11/16/2014  . HLD (hyperlipidemia) 11/16/2014  . ESRD on dialysis 11/16/2014  . History of stroke 11/16/2014  . Essential hypertension   . PFO (patent foramen ovale)   . Acute respiratory failure with hypoxemia   . CVA (cerebral infarction) 11/10/2014  . Chronic ischemic colitis 09/25/2014  . Acute right ACA stroke 09/11/2014  . Left hemiparesis 09/11/2014  . History of ETT   . Duodenal fistula   . Sepsis   . Aortic aneurysm 08/13/2014  . Protein-calorie malnutrition   . Itching 08/12/2014  . Duodenal anastomotic leak   . Protein calorie malnutrition   . SVT (supraventricular tachycardia)   . Right heart failure   . ESRD (end stage renal disease)   . Abdominal pain   . Palliative care encounter   . 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 Fistula   . History of intermediate V/Q scan   . Other emphysema   . Intra-abdominal abscess   . Abnormal  TSH   . Enteritis due to Clostridium difficile 07/19/2014  . Abdominal abscess   . Pelvic fluid collection   . Perforated gastric ulcer 07/01/2014  . SOB (shortness of breath)   . PJRT (permanent junctional reciprocating tachycardia)   . Paroxysmal atrial tachycardia   . PSVT (paroxysmal supraventricular tachycardia)   . Stroke   . Hyperlipidemia   . 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 Hypertension 06/20/2014  . ESRD needing dialysis 06/20/2014  . Obesity (BMI 30-39.9) 06/20/2014  . Depression 06/20/2014  . Left renal mass 06/20/2014  . Chronic diastolic heart failure 06/20/2014  . H1N1 influenza 06/20/2014  . Tobacco use disorder 06/20/2014  . ARF (acute renal failure)   . Renal failure (ARF), acute on chronic   . Acute respiratory failure with hypoxia 06/17/2014  . End stage renal disease 01/21/2014    Expected Discharge Date: Expected Discharge Date: 12/19/14  Team Members Present: Physician leading conference: Dr. Claudette Laws Social Worker Present: Dossie Der, LCSW Nurse Present: Other (comment) Arlean Hopping) PT Present: Edman Circle, Marcene Brawn, PT OT Present: Rosalio Loud, OT SLP Present: Jackalyn Lombard, SLP PPS Coordinator present : Tora Duck, RN, CRRN     Current Status/Progress Goal Weekly Team Focus  Medical   severe aphasia, some increase with R UE strength, improved mobility  Voacalize basic needs  Initiiation of activity   Bowel/Bladder   Incont. of bowel and bladder. Loose  BMs. 9-6 LBM  manage bowel and bladder max assist  attempt timed toileting regiemen   Swallow/Nutrition/ Hydration   NPO  min assist   continue to address toleration of ice chips and management of secretions    ADL's   Total assist with self-care tasks due to impaired initiation and processing.  Max assist of 1 for sit> stand and LB hygiene and dressing.  min assist overall, mod assist LB dressing  initiation, BUE NMR, sit > stand, overall  participation in treatment session   Mobility   min/modA bed mobility, modA transfers, +2 gait and stairs. Improving pt initiation, participation.  minA bed mobility, transfers, gait, w/c; modA stairs  initiation of mobility tasks, L attention, transfers and gait training   Communication   globally aphasic, orally apraxic, remains nonverbal but has demonstrated improvements in accuracy of yes/no responses   min-max assist   following 1 and 2 step commands, continue to address yes/no response accuracy, phonation, and multimodal communication    Safety/Cognition/ Behavioral Observations  improved initiation and sustained attention to tasks, continues to require assist for functional problem solving during basic familiar tasks   min-mod for basic   right attention, sustained attention to tasks, functional problem solving, initiation.    Pain   non verbal. Pt nods to "pain" when asks but unable to specify a location.  tylenol PRN q4 hrs  pain managed with faces of 2 or less  assess pain and watch for nonverbal cues.   Skin   MASD to peri/sacaral area. EPBC applied as ordered WOC. scab to abdomen with sutures hold it inplace.   To keep skin free of new skin breakdown and infections mod assist  assess skin q shift and turn q2 hrs. Contiune POC with [facility]      *See Care Plan and progress notes for long and short-term goals.  Barriers to Discharge: see above, chronic TF    Possible Resolutions to Barriers:  cont rehab    Discharge Planning/Teaching Needs:  Daughter has been here to participate in therapies, realizes pt will be more care than last time. Will need to have pt's son come in also and do hands on care      Team Discussion:  Making gains in therapies-plus one for care, last week was plus two. More alert, yes/no more accurate. Try to change to bolus feeds or night time feeds. Timed toileting for improved continence. Mobility and initiation improved.  Will need to begin family  training  Revisions to Treatment Plan:  None   Continued Need for Acute Rehabilitation Level of Care: The patient requires daily medical management by a physician with specialized training in physical medicine and rehabilitation for the following conditions: Daily direction of a multidisciplinary physical rehabilitation program to ensure safe treatment while eliciting the highest outcome that is of practical value to the patient.: Yes Daily medical management of patient stability for increased activity during participation in an intensive rehabilitation regime.: Yes Daily analysis of laboratory values and/or radiology reports with any subsequent need for medication adjustment of medical intervention for : Neurological problems;Other  Delecia Vastine, Lemar Livings 12/09/2014, 1:15 PM

## 2014-12-09 NOTE — Progress Notes (Signed)
Subjective/Complaints:  No issues overnite.  Working with PT , was more alert during the day a dn initiating yesterday per PT  ROS cannot obtain due to global aphasia   Objective: Vital Signs: Blood pressure 122/81, pulse 99, temperature 98.4 F (36.9 C), temperature source Oral, resp. rate 18, weight 71.7 kg (158 lb 1.1 oz), SpO2 93 %. No results found. Results for orders placed or performed during the hospital encounter of 11/25/14 (from the past 72 hour(s))  Glucose, capillary     Status: None   Collection Time: 12/06/14 11:40 AM  Result Value Ref Range   Glucose-Capillary 92 65 - 99 mg/dL  Glucose, capillary     Status: None   Collection Time: 12/06/14  6:02 PM  Result Value Ref Range   Glucose-Capillary 96 65 - 99 mg/dL  Glucose, capillary     Status: None   Collection Time: 12/06/14 11:58 PM  Result Value Ref Range   Glucose-Capillary 85 65 - 99 mg/dL  Glucose, capillary     Status: None   Collection Time: 12/07/14  6:24 AM  Result Value Ref Range   Glucose-Capillary 92 65 - 99 mg/dL  Glucose, capillary     Status: None   Collection Time: 12/07/14 12:02 PM  Result Value Ref Range   Glucose-Capillary 77 65 - 99 mg/dL  Glucose, capillary     Status: Abnormal   Collection Time: 12/07/14  6:05 PM  Result Value Ref Range   Glucose-Capillary 108 (H) 65 - 99 mg/dL  Renal function panel     Status: Abnormal   Collection Time: 12/07/14  8:34 PM  Result Value Ref Range   Sodium 131 (L) 135 - 145 mmol/L   Potassium 3.9 3.5 - 5.1 mmol/L   Chloride 90 (L) 101 - 111 mmol/L   CO2 29 22 - 32 mmol/L   Glucose, Bld 92 65 - 99 mg/dL   BUN 58 (H) 6 - 20 mg/dL   Creatinine, Ser 6.25 (H) 0.44 - 1.00 mg/dL   Calcium 8.8 (L) 8.9 - 10.3 mg/dL   Phosphorus 3.8 2.5 - 4.6 mg/dL   Albumin 2.5 (L) 3.5 - 5.0 g/dL   GFR calc non Af Amer 7 (L) >60 mL/min   GFR calc Af Amer 8 (L) >60 mL/min    Comment: (NOTE) The eGFR has been calculated using the CKD EPI equation. This calculation has  not been validated in all clinical situations. eGFR's persistently <60 mL/min signify possible Chronic Kidney Disease.    Anion gap 12 5 - 15  CBC     Status: Abnormal   Collection Time: 12/07/14  8:35 PM  Result Value Ref Range   WBC 2.9 (L) 4.0 - 10.5 K/uL   RBC 3.53 (L) 3.87 - 5.11 MIL/uL   Hemoglobin 9.6 (L) 12.0 - 15.0 g/dL   HCT 30.2 (L) 36.0 - 46.0 %   MCV 85.6 78.0 - 100.0 fL   MCH 27.2 26.0 - 34.0 pg   MCHC 31.8 30.0 - 36.0 g/dL   RDW 18.1 (H) 11.5 - 15.5 %   Platelets 297 150 - 400 K/uL  Glucose, capillary     Status: Abnormal   Collection Time: 12/08/14 12:12 AM  Result Value Ref Range   Glucose-Capillary 100 (H) 65 - 99 mg/dL  Glucose, capillary     Status: Abnormal   Collection Time: 12/08/14  6:17 AM  Result Value Ref Range   Glucose-Capillary 111 (H) 65 - 99 mg/dL  Glucose, capillary  Status: None   Collection Time: 12/08/14 11:57 AM  Result Value Ref Range   Glucose-Capillary 73 65 - 99 mg/dL  Glucose, capillary     Status: None   Collection Time: 12/08/14  6:06 PM  Result Value Ref Range   Glucose-Capillary 83 65 - 99 mg/dL  Glucose, capillary     Status: None   Collection Time: 12/09/14 12:04 AM  Result Value Ref Range   Glucose-Capillary 83 65 - 99 mg/dL   Comment 1 Notify RN   Glucose, capillary     Status: None   Collection Time: 12/09/14  6:48 AM  Result Value Ref Range   Glucose-Capillary 94 65 - 99 mg/dL   Comment 1 Notify RN      HEENT: poor oral movements  facial diplegia, cannot open mouth to command Cardio: RRR and no murmur Resp: CTA B/L.no use of accesory muscles, No wheezes and no upper airway sounds, not able to cough on command GI: BS positive and NT, ND, prior miline abd incision with extensive scarring, scab with old sutures attached Extremity:  Pulses positive and No Edema Skin:   Other G tube site NT Neuro: Flat but very alert, Cranial Nerve Abnormalities bilateral central 7,Aphasic and Apraxic, nodding for Y/N  questions Musc/Skel:  Other decrease ROM in BIlateral knees and Right shoulder and elbow due to tone Gen NAD   Assessment/Plan: 1. 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 Physiatrist is providing close team supervision and 24 hour management of active medical problems listed below. Physiatrist and rehab team continue to assess barriers to discharge/monitor patient progress toward functional and medical goals.  Team conference today please see physician documentation under team conference tab, met with team face-to-face to discuss problems,progress, and goals. Formulized individual treatment plan based on medical history, underlying problem and comorbidities.   FIM: Function - Bathing Position: Wheelchair/chair at sink Body parts bathed by patient: Right upper leg, Left upper leg Body parts bathed by helper: Front perineal area, Buttocks, Right lower leg, Left lower leg, Back Bathing not applicable: Right arm, Left arm, Chest, Abdomen (refused UB bathing) Assist Level: Supervision or verbal cues, Touching or steadying assistance(Pt > 75%) (Mod assist for standing balance)  Function- Upper Body Dressing/Undressing What is the patient wearing?: Pull over shirt/dress Pull over shirt/dress - Perfomed by patient: Thread/unthread left sleeve Pull over shirt/dress - Perfomed by helper: Thread/unthread right sleeve, Put head through opening, Pull shirt over trunk Assist Level: Touching or steadying assistance(Pt > 75%) Set up : To obtain clothing/put away Function - Lower Body Dressing/Undressing What is the patient wearing?: Non-skid slipper socks, Pants Position: Wheelchair/chair at sink Pants- Performed by helper: Thread/unthread right pants leg, Thread/unthread left pants  leg, Pull pants up/down Non-skid slipper socks- Performed by helper: Don/doff right sock, Don/doff left sock Assist Level: Touching or steadying assistance (Pt > 75%)  Function - Toileting Toileting activity did not occur: No continent bowel/bladder event Toileting steps completed by helper: Adjust clothing prior to toileting, Adjust clothing after toileting, Performs perineal hygiene Toileting Assistive Devices: Other (comment) (UE support on sink) Assist level: Touching or steadying assistance (Pt.75%)  Function - Air cabin crew transfer activity did not occur: Safety/medical concerns Toilet transfer assistive device: Drop arm commode Assist level to toilet: Maximal assist (Pt 25 - 49%/lift and lower) Assist level from toilet:  Maximal assist (Pt 25 - 49%/lift and lower)  Function - Chair/bed transfer Chair/bed transfer method: Stand pivot Chair/bed transfer assist level: Moderate assist (Pt 50 - 74%/lift or lower) Chair/bed transfer assistive device: Armrests, Bedrails Chair/bed transfer details: Manual facilitation for weight shifting, Verbal cues for sequencing  Function - Locomotion: Wheelchair Will patient use wheelchair at discharge?: Yes Wheelchair activity did not occur:  (Attempted hand-over-hand facilitation for w/c propulsion, visual demonstration, however no pt initiation evident) Assist Level: Dependent (Pt equals 0%) Function - Locomotion: Ambulation Ambulation activity did not occur: Safety/medical concerns (Attempted ambulation, however upon standing x3 reps pt unable to step (unable to determine if due to$Remove' \\apraxia'[id number]$  or pt fearful)) Assistive device: Hand held assist (+2 HHA) Max distance: 60 Assist level: 2 helpers Walk 10 feet activity did not occur: Safety/medical concerns Assist level: 2 helpers Walk 50 feet with 2 turns activity did not occur: Safety/medical concerns Assist level: 2 helpers Walk 150 feet activity did not occur: Safety/medical  concerns Assist level: 2 helpers Walk 10 feet on uneven surfaces activity did not occur: Safety/medical concerns  Function - Comprehension Comprehension: Auditory Comprehension assist level: Understands basic 25 - 49% of the time/ requires cueing 50 - 75% of the time  Function - Expression Expression: Nonverbal Expression assistive device: Other (Comment) (head nods) Expression assist level: Expresses basis less than 25% of the time/requires cueing >75% of the time.  Function - Social Interaction Social Interaction assist level: Interacts appropriately less than 25% of the time. May be withdrawn or combative.  Function - Problem Solving Problem solving assist level: Solves basic less than 25% of the time - needs direction nearly all the time or does not effectively solve problems and may need a restraint for safety  Function - Memory Memory assist level: Recognizes or recalls less than 25% of the time/requires cueing greater than 75% of the time Patient normally able to recall (first 3 days only): None of the above  Medical Problem List and Plan: 1. Functional deficits secondary to left MCA infarct/respiratory failure. Right hemiparesis improving, now using Y/N nods 2. DVT Prophylaxis/Anticoagulation: SCDs. Monitor for any signs of DVT. 8/12 venous dopplers neg 3. Pain Management: Tylenol as needed, no apparent pain during PT 4. Dysphagia. Patient is nothing by mouth. Status post gastrostomy tube 08/12/2014 per interventional radiology for nutritional support. poor prognosis for improvement given severe bilateral deficits, do not anticipate d/c PEG during this admission, discussed with SLP not to spend much time with swallow   5. Neuropsych: This patient is not capable of making decisions on her own behalf. 6. Skin/Wound Care: Routine skin checks. PEG site maintenance. continue turning and nutrition, site looks good 7. Fluids/Electrolytes/Nutrition: Routine I&O. Adjust TF based on  nutritional needs. Renal adjusting dialysis, complicated situation, Not a diabetic, getting CBGs because of TF, these have been normal, Dietary writing TF orders 8. End-stage renal disease with hemodialysis. Follow-up per renal services. Hemodialysis in PM after therapies to avoid conflict, Nepro for nutrition, last K was normal at 4.1 9. Seizure prophylaxis. Keppra 500 mg twice a day, monitor drug toxicity. No seizures, continue current dose no evidence of excessive sedation 10. Hypothyroidism. Synthroid 11. Chronic anemia. Aranesp weekly with dialysis, hgb monitored in HD, nephro managing 12. History of asthma. Schedule nebulizers given aphasia. , no wheezing today, no volitional cough, secretions improving 13. Mood/anxiety. Continue Xanax on dialysis days 14.  Hx of recurrent resp failure, high risk for PNA given comorbid conditions,  RR increases at times. Comfortable. Secretions  better. Afebrile. No distress.  -continue to aggressively monitor, aspiration precautions,  -wean oxygen down to keep sats >90% 15.  Bowel incont- monitor skin 16.  Aphasia, exp>Receptive, complicated by apraxia- Now starting with Y/N also with decreased initiation, trial ritalin LOS (Days) 14 A FACE TO FACE EVALUATION WAS PERFORMED  [name] E 12/09/2014, 8:28 AM

## 2014-12-09 NOTE — Progress Notes (Addendum)
Speech Language Pathology Weekly Progress and Session Note  Patient Details  Name: [name] MRN: [id number] Date of Birth: Jul 03, 1955  Beginning of progress report period: December 02, 2014 End of progress report period: December 09, 2014  Today's Date: 12/09/2014 SLP Individual Time: 1304-1400 SLP Individual Time Calculation (min): 56 min  Short Term Goals: Week 2: SLP Short Term Goal 1 (Week 2): Pt will improve management of her secretions as evidenced by ability to orally expectorate secretions and anteriorly contain her saliva with max assist multimodal cues.   SLP Short Term Goal 1 - Progress (Week 2): Progressing toward goal SLP Short Term Goal 2 (Week 2): Pt will consume PO trials with max assist multimodal cues to clear boluses from the oral cavity and to initiate swallow in a timely manner over 3 consecutive sessions prior to completion of objective swallow study.  SLP Short Term Goal 2 - Progress (Week 2): Progressing toward goal SLP Short Term Goal 3 (Week 2): Pt will initiate a basic,familiar task with max assist multimodal cues over 75% of observable opportunities.  SLP Short Term Goal 3 - Progress (Week 2): Met SLP Short Term Goal 4 (Week 2): Pt will visually scan to midline in >25% of observable opportunities during basic, functional tasks with max assist multimodal cues.  SLP Short Term Goal 4 - Progress (Week 2): Met SLP Short Term Goal 5 (Week 2): Pt will vocalize to produce vowels in isolation with max assist multimodal cues.   SLP Short Term Goal 5 - Progress (Week 2): Progressing toward goal    New Short Term Goals: Week 3: SLP Short Term Goal 1 (Week 3): Pt will improve management of her secretions as evidenced by ability to orally expectorate secretions and anteriorly contain her saliva with max assist multimodal cues.   SLP Short Term Goal 2 (Week 3): Pt will consume PO trials with max assist multimodal cues to clear boluses from the oral cavity and to initiate  swallow in a timely manner over 3 consecutive sessions prior to completion of objective swallow study.  SLP Short Term Goal 3 (Week 3): Pt will initiate a basic,familiar task with max assist verbal cues over 75% of observable opportunities.  SLP Short Term Goal 4 (Week 3): Pt will visually scan to midline in >25% of observable opportunities during basic, functional tasks with mod verbal cues.  SLP Short Term Goal 5 (Week 3): Pt will vocalize to produce vowels in isolation with max assist multimodal cues.    Weekly Progress Updates:  Pt made slow functional gains this reporting period and has met 2 out of 5 short term goals. Pt remains NPO due to overt s/s of aspiration with trials of ice chips and poor management of secretions.  Pt continues to demonstrate a wet, congested cough which is nonproductive for orally expectorating secretions.  Due to motor planning deficits, pt is unable to cough on command.  Pt has demonstrated improvements in her yes/no response accuracy and her initiation of tasks is slightly more timely in comparison to initial eval.  Pt continues to require max to hand over hand assist to complete tasks in a timely manner.  She remains nonverbal at this time which SLP suspects is primarily related to her inability to coordinate volitional redirection or air through the upper airway with onset of voicing.  Pt would continue to benefit from skilled ST while inpatient in order to maximize functional independence and reduce burden of care prior to discharge.  Pt's family  has not been present for education this reporting period but SLP will continue to provide education as they are available.     Intensity: Minumum of 1-2 x/day, 30 to 90 minutes Frequency: 3 to 5 out of 7 days Duration/Length of Stay: 21-28 days  Treatment/Interventions: Cognitive remediation/compensation;Cueing hierarchy;Functional tasks;Multimodal communication approach;Patient/family education;Internal/external  aids;Speech/Language facilitation;Dysphagia/aspiration precaution training;Environmental controls   Daily Session  Skilled Therapeutic Interventions: Pt was seen for skilled ST targeting cognitive-linguistic goals.  Upon arrival, pt was seated upright in wheelchair, awake, alert, and agreeable to participate in ST as indicated by head nod.  Daughter present for first half of today's session.  Pt was emotionally labile throughout the beginning of today's session but was able to convey via head nods/shakes that she was not sad but was in fact happy because her daughter was visiting. SLP facilitated the session with a basic sorting task targeting visual scanning to the right, initiation, and sustained attention to task.  Pt sorted picture cards into categories based on colors of images with max faded to min assist verbal and visual cues for initiation.  She sustained her attention to task for 1 minute intervals with min-mod verbal cues for redirection.  Pt also was noted with improved attempts to initiate functional communication as evidenced by her touching and making eye contact with daughter and SLP while making groping movements with her mouth.  Pt was able to return demonstration of basic diaphragmatic breathing techniques with manipulatives and max faded to min assist verbal cues to redirect air through the upper airway to facilitate voicing.  Pt was consistently stimulable for lip rounding for articulation of rounded phonemes but was unable to coordinate respiration with onset of phonation in isolation or in basic, familiar songs.  Pt was returned back to room and handed off to RN and nurse tech to transfer back to bed.  Continue per current plan of care.     Function:   Eating Eating                 Cognition Comprehension Comprehension assist level: Understands basic 25 - 49% of the time/ requires cueing 50 - 75% of the time  Expression   Expression assist level: Expresses basis less than  25% of the time/requires cueing >75% of the time.  Social Interaction Social Interaction assist level: Interacts appropriately 25 - 49% of time - Needs frequent redirection.  Problem Solving Problem solving assist level: Solves basic 25 - 49% of the time - needs direction more than half the time to initiate, plan or complete simple activities  Memory Memory assist level: Recognizes or recalls 25 - 49% of the time/requires cueing 50 - 75% of the time   General    Pain Pain Assessment Pain Assessment: Faces Faces Pain Scale: No hurt  Therapy/Group: Individual Therapy  Avion Patella, Selinda Orion 12/09/2014, 3:43 PM

## 2014-12-09 NOTE — Progress Notes (Signed)
Nutrition Follow-up  DOCUMENTATION CODES:   Not applicable  INTERVENTION:  Unable to transition to bolus tube feeds as pt with a J-tube, however RD able to transition to nocturnal feeds.  Stop current continuous tube feeds.   At 1800 daily, Initiate nocturnal feeds of Nepro formula via J-tube at new goal rate of 85 ml/hr x 14 hours (6pm-8am) to provide 2142 kcal (100% of needs), 96 grams of protein, and 867 ml of free water.   Provide free water flushes 50 ml TID.  RD to continue to monitor.   NUTRITION DIAGNOSIS:   Inadequate oral intake related to inability to eat as evidenced by NPO status; ongoing  GOAL:   Patient will meet greater than or equal to 90% of their needs; met  MONITOR:   TF tolerance, Weight trends, Labs, I & O's, Skin  REASON FOR ASSESSMENT:   Consult Enteral/tube feeding initiation and management  ASSESSMENT:   59 y.o. 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  Presented 11/10/2014 with right-sided weakness and left gaze deviation. MRI of the brain showed moderate size left MCA territory infarct affecting the left insula and operculum.  RD consulted for transitioning to bolus tube feeds. Pt has a G-J tube which was placed 08/12/14 due to duodenal ulcer perforation. Bolus feeds are not tolerated via J-tube in the jejunum. Recommend contacting surgery to determine if tube can be converted to PEG/g-tube. RD to currently modify orders to transition to nocturnal feeds which will additionally benefit patient as continuous tube feeds generally interferes with day time activities. Discussed with RN about new tube feeding plans/orders.   RD to continue to monitor.   Diet Order:  Diet NPO time specified  Skin:   (MASD on buttocks,  generalized edema)  Last BM:  9/7  Height:   Ht Readings from Last 1 Encounters:  11/11/14 5' 7.01" (1.702 m)    Weight:   Wt Readings from Last 1 Encounters:  12/09/14 158 lb 1.1 oz (71.7 kg)    Ideal Body Weight:  61 kg  BMI:  Body mass index is 24.75 kg/(m^2).  Estimated Nutritional Needs:   Kcal:  2000-2200  Protein:  95-115 grams  Fluid:  Per MD  EDUCATION NEEDS:   No education needs identified at this time  Corrin Parker, MS, RD, LDN Pager # [phone] After hours/ weekend pager # [phone]

## 2014-12-09 NOTE — Progress Notes (Signed)
  [facility] Progress Note   Subjective: no complaints  Filed Vitals:   12/09/14 0814 12/09/14 0818 12/09/14 1400 12/09/14 1520  BP:   125/87   Pulse:   101   Temp:      TempSrc:      Resp:      Weight:      SpO2: 93% 93% 100% 100%   Exam: Alert, up in WC , aphasic , nonverbal, nods heads to questions No jvd Chest rales L base, R clear RRR  Abd soft ntnd PEG tube LUQ No LE edema LUA AVF +bruit R IJ cath Neuro R hemiparesis , using R arm more than before  TTS [place] 4h 2/2 [place] 91kg Heparin none Hect 2 ug Aranesp 200 ug/wk + venofer 50/wk      Assessment: 1 CVA- with R hemiparesis and aphasia.- now in rehab PT/OT 2 ESRD TTS HD, using AVF and cath still in 3 Anemia - Hbg 9.5- aranesp q tuesday 4 HPTH - phos 4.3- fosrenol/Ca+ 10.1 corrected- hectorol on hold  5 Vol down 20kg from prior dry wt 6 BP - normotensive, no meds 7 Nutrition - NPO. Alb 2.5. nepro per gtube/ vitamin/ 8 Hx PAT - no documented hx of afib. Not on any medication for this now.   Plan - HD today then on Sat to resume TTS schedule, ask IR to remove tunneled HD cath    Stephanie Moselle MD  pager [phone]    cell [phone]  12/09/2014, 3:37 PM     Recent Labs Lab 12/02/14 1857 12/04/14 1958 12/07/14 2034  NA 134* 134* 131*  K 4.7 4.1 3.9  CL 92* 92* 90*  CO2 GLUCOSE 80 93 92  BUN 76* 57* 58*  CREATININE 7.92* 6.93* 6.25*  CALCIUM 9.4 9.0 8.8*  PHOS 4.4 4.3 3.8    Recent Labs Lab 12/02/14 1857 12/04/14 1958 12/07/14 2034  ALBUMIN 2.6* 2.5* 2.5*    Recent Labs Lab 12/02/14 1901 12/04/14 1958 12/07/14 2035  WBC 9.2 8.2 2.9*  HGB 9.6* 9.5* 9.6*  HCT 30.6* 30.3* 30.2*  MCV 85.2 86.8 85.6  PLT 324 321 297   . ALPRAZolam  0.5 mg Per Tube Q M,W,F-HD  . antiseptic oral rinse  7 mL Mouth Rinse TID  . aspirin  325 mg Oral Daily  . budesonide  0.25 mg Nebulization BID  . chlorhexidine  15 mL Mouth/Throat BID  . darbepoetin (ARANESP) injection  - DIALYSIS  100 mcg Intravenous Q Wed-HD  . free water  50 mL Per Tube TID  . guaifenesin  200 mg Per Tube TID  . ipratropium-albuterol  3 mL Nebulization TID  . lanthanum  500 mg Per Tube TID  . levETIRAcetam  500 mg Oral BID  . levothyroxine  25 mcg Oral QAC breakfast  . methylphenidate  10 mg Per Tube BID WC  . mirtazapine  7.5 mg Per Tube QHS  . multivitamin  1 tablet Oral QHS  . pantoprazole sodium  40 mg Per Tube Q1200   . feeding supplement (NEPRO CARB STEADY)     acetaminophen, acetylcysteine, ALPRAZolam, atropine, ipratropium-albuterol, ondansetron **OR** ondansetron (ZOFRAN) IV, sorbitol

## 2014-12-09 NOTE — Progress Notes (Signed)
Physical Therapy Session Note  Patient Details  Name: [name] MRN: [id number] Date of Birth: 1955-09-23  Today's Date: 12/09/2014 PT Individual Time: 0800-0900 PT Individual Time Calculation (min): 60 min   Short Term Goals: Week 2:  PT Short Term Goal 1 (Week 2): Pt will perform bed mobility minA and min cues, with bedrails PT Short Term Goal 2 (Week 2): Will perform stand pivot transfer w/c <> bed with consistent minA and min cues PT Short Term Goal 3 (Week 2): Pt will perform ambulation x50' and modA +1 PT Short Term Goal 4 (Week 2): Pt will perform ascent/descent of 8 3-inch stairs with maxA +1 PT Short Term Goal 5 (Week 2): Pt will perform w/c propulsion x50' with modA and max verbal cues  Skilled Therapeutic Interventions/Progress Updates:    Pt received supine in bed, lethargic however awakes to verbal commands. No signs of pain or discomfort, however pt unable to verbalize; see PAINAD below for details. Supine>sit on EOB with HOB elevated and cues for use of LUE on bedrail. Pt initiates LE movement towards EOB, and requires modA for supine>sit, increased time to perform due to slow initiation. Sit >stand with bed elevated, modA. Stand pivot mod/maxA with apraxia interfering with turning, requires facilitation for weight shifting and RLE progression to turn. Sitting in w/c, pt received breathing treatment from RT; while receiving treatment pt engaged in cognitive task of playing card game "war". Game required reaching to flip cards, identification of higher card. Required increased cues compared to yesterdays session, decreased initiation for reaching and pointing tasks. Sit <>stand x3 repetitions at sink to don brief; sit >stand with modA and use of UEs on sink. Once standing pt able to stand with UEs on sink with supervision/CGA. Gait in hallway with +2 HHA x75'. Requires occasional standing rest breaks with increased RR and apparent fatigue. Pt remained seated at completion of  session, all needs within reach and QR belt intact.   Therapy Documentation Precautions:  Precautions Precautions: Fall Precaution Comments: fall, NPO, pusher to the right at times, non-verbal,  Restrictions Weight Bearing Restrictions: No Pain: PAINAD (Pain Assessment in Advanced Dementia) Breathing: normal Negative Vocalization: none Facial Expression: sad, frightened, frown Body Language: relaxed Consolability: no need to console PAINAD Score: 1   Function:    Bed Mobility Roll left and right activity        Sit to lying activity        Lying to sitting activity   Assist level: Moderate assist (Pt 50 - 74%, lift 2 legs) Lying to sitting assistive device: HOB elevated;Bedrails  Mobility details Bed mobility details: Manual facilitation for placement;Verbal cues for safe use of DME/AE;Verbal cues for sequencing   Transfers Sit to stand transfer   Sit to stand assist level: Moderate assist (Pt 50 - 74%/lift 2 legs/lift or lower) Sit to stand assistive device: Bedrails  Chair/bed transfer   Chair/bed transfer method: Stand pivot Chair/bed transfer assist level: Moderate assist (Pt 50 - 74%/lift or lower) Chair/bed transfer assistive device: Armrests;Bedrails   Chair/bed transfer details: Manual facilitation for weight shifting;Verbal cues for sequencing   Scientist, physiological device: Hand held assist (+2 HHA) Max distance: 75 Assist level: 2 helpers  Walk 10 feet activity   Assist level: 2 helpers  Walk 50 feet with 2 turns activity  Assist level: 2 helpers  Walk 150 feet activity      Walk 10 feet on uneven surfaces activity      Stairs          Walk up/down 1 step activity        Walk up/down 4 steps activity      Walk up/down 12 steps activity      Pick up small objects from floor      Wheelchair          Wheel 50 feet with 2 turns activity      Wheel 150 feet  activity       Therapy/Group: Individual Therapy  [name] 12/09/2014, 9:01 AM

## 2014-12-10 ENCOUNTER — Inpatient Hospital Stay (HOSPITAL_COMMUNITY): Payer: Medicaid Other

## 2014-12-10 ENCOUNTER — Inpatient Hospital Stay (HOSPITAL_COMMUNITY): Payer: Medicaid Other | Admitting: Occupational Therapy

## 2014-12-10 ENCOUNTER — Inpatient Hospital Stay (HOSPITAL_COMMUNITY): Payer: Medicaid Other | Admitting: Speech Pathology

## 2014-12-10 LAB — GLUCOSE, CAPILLARY
GLUCOSE-CAPILLARY: 108 mg/dL — AB (ref 65–99)
GLUCOSE-CAPILLARY: 110 mg/dL — AB (ref 65–99)
GLUCOSE-CAPILLARY: 93 mg/dL (ref 65–99)
Glucose-Capillary: 66 mg/dL (ref 65–99)
Glucose-Capillary: 68 mg/dL (ref 65–99)

## 2014-12-10 MED ORDER — CHLORHEXIDINE GLUCONATE 4 % EX LIQD
CUTANEOUS | Status: AC
  Filled 2014-12-10: qty 15

## 2014-12-10 MED ORDER — LANOLIN HYDROUS EX OINT
TOPICAL_OINTMENT | Freq: Two times a day (BID) | CUTANEOUS | Status: DC
  Administered 2014-12-10 – 2014-12-23 (×22): via TOPICAL
  Filled 2014-12-10 (×2): qty 28

## 2014-12-10 MED ORDER — LIDOCAINE HCL 1 % IJ SOLN
INTRAMUSCULAR | Status: AC
  Administered 2014-12-10: 12:00:00
  Filled 2014-12-10: qty 20

## 2014-12-10 NOTE — Progress Notes (Signed)
Pt's CBG was 66 at lunch time,Dan Angiulli PAC was notified.Keep monitoring pt. Closely and assessing her needs.

## 2014-12-10 NOTE — Progress Notes (Signed)
Physical Therapy Weekly Progress Note  Patient Details  Name: [name] MRN: [id number] Date of Birth: 05-12-1955  Beginning of progress report period: December 10, 2014 End of progress report period: December 04, 2014  Today's Date: 12/10/2014 PT Individual Time: 1300-1400 PT Individual Time Calculation (min): 60 min   Patient has met 2 of 5 short term goals.  Pt continues to be limited by global aphasia, impaired initiation of functional tasks, limited activity tolerance and RLE/RUE strength and coordination deficits. W/c goal unmet due to decreased pt initiation of task. Also, pt utilizing tilt-in-space w/c for pressure relief and unable to self-propel, so w/c propulsion has not been a major focus of therapy sessions. Stand pivot transfers require modA due to LE weakness as well as decrease motor planning for sequencing during turns. Stairs have also not been major focus of session due to pt fear of mobility, poor initiation, and greater focus on transfers and gait.  Patient continues to demonstrate the following deficits: decreased strength, NM control, awareness, coordination, activity tolerance, initiation for functional mobility and therefore will continue to benefit from skilled PT intervention to enhance overall performance with activity tolerance, balance, postural control, ability to compensate for deficits, functional use of  right upper extremity and right lower extremity, attention, awareness and coordination.  Patient progressing slowly towards long term goals. .  Plan of care revisions: Goals downgraded due to slow progress; refer to goal sheet for details..  PT Short Term Goals Week 2:  PT Short Term Goal 1 (Week 2): Pt will perform bed mobility minA and min cues, with bedrails PT Short Term Goal 1 - Progress (Week 2): Met PT Short Term Goal 2 (Week 2): Will perform stand pivot transfer w/c <> bed with consistent minA and min cues PT Short Term Goal 2 - Progress (Week 2):  Not met PT Short Term Goal 3 (Week 2): Pt will perform ambulation x50' and modA +1 PT Short Term Goal 3 - Progress (Week 2): Met PT Short Term Goal 4 (Week 2): Pt will perform ascent/descent of 8 3-inch stairs with maxA +1 PT Short Term Goal 4 - Progress (Week 2): Not met PT Short Term Goal 5 (Week 2): Pt will perform w/c propulsion x50' with modA and max verbal cues PT Short Term Goal 5 - Progress (Week 2): Not met Week 3:  PT Short Term Goal 1 (Week 3): Pt will perform bed mobility supervision and min cues PT Short Term Goal 2 (Week 3): Pt will perform stand pivot transfer consistent minA with mod cues for sequencing PT Short Term Goal 3 (Week 3): Pt will perform gait with LRAD x75' with supervision overall PT Short Term Goal 4 (Week 3): Pt will perform 8 3-inch stairs with modA PT Short Term Goal 5 (Week 3): Pt will perform standing balance x5 min with BUE support and supervision  Skilled Therapeutic Interventions/Progress Updates:    Pt received supine in bed; no c/o of pain initially and agreeable to treatment. Supine>sit minA with significantly improved pt intitiation for task. Pt reaches L sidelying on L elbow with UEs on bed rail without assistance, and then has difficulty with moving to full sitting position, requiring minA for adjusting hips. Stand pivot transfer bed >w/c modA; assist for lifting, and once pt begins turning, pt freezes and requires max cues for RLE progression to continue turning, and modA for slowly lowering to chair before completing turn. Stand pivot transfer w/c <>bed in simulated apartment to improve pt initiation, motor  learning and carryover to reduce burden of care. Continues to demonstrate difficulty with foot progression and motor planning once initiating activity, however improved when turning to L side compared to R. When seated on EOB pt cued to lay down, however pt adamantly shakes her head no; when questioned if it is difficult to breathe when laying down pt  shakes head yes. Pt cued to perform another transfer w/c >bed, however pt becomes emotional, tearful. When questioned regarding pain pt shakes head yes, after several yes/no questions determined R foot is bothering pt. PROM of R foot performed, skin examined, with no obvious deformity, swelling, skin irritation and pt not demonstrating increased signs of pain with PROM or joint mobility. Also noted slight nose bleed; RN alerted to both foot pain and nose bleed.   Sit <>stand x3 trials with RW and rollator in front of pt in an effort to simulate pre-morbid gait with rollator. Pt cued in marching in place; demonstrates LLE clearance however hesitant to lift RLE from ground even with assistance for L weight shifting. Gait training in hallway for 1 trial of 50' including 2 turns; minA/CGA from one therapist with second therapist present to assist with O2 management. As with transfers, pt demonstrates increased difficulty with turning at end of hall and prior to sitting in w/c. Pt returned to room and responds yes to questioning regarding staying up in chair. QR belt intact, all needs within reach at completion of session.   Therapy Documentation Precautions:  Precautions Precautions: Fall Precaution Comments: fall, NPO, pusher to the right at times, non-verbal,  Restrictions Weight Bearing Restrictions: No Pain: Pain Assessment Pain Assessment: No/denies pain Faces Pain Scale: No hurt PAINAD (Pain Assessment in Advanced Dementia) Breathing: occasional labored breathing, short period of hyperventilation Negative Vocalization: none Facial Expression: sad, frightened, frown Body Language: tense, distressed pacing, fidgeting Consolability: distracted or reassured by voice/touch PAINAD Score: 4   See Function Navigator for Current Functional Status.  Therapy/Group: Individual Therapy  Luberta Mutter 12/10/2014, 3:27 PM

## 2014-12-10 NOTE — Progress Notes (Signed)
Speech Language Pathology Daily Session Note  Patient Details  Name: [name] MRN: [id number] Date of Birth: 06-Dec-1955  Today's Date: 12/10/2014 SLP Individual Time: 0801-0900 SLP Individual Time Calculation (min): 59 min  Short Term Goals: Week 3: SLP Short Term Goal 1 (Week 3): Pt will improve management of her secretions as evidenced by ability to orally expectorate secretions and anteriorly contain her saliva with max assist multimodal cues.   SLP Short Term Goal 2 (Week 3): Pt will consume PO trials with max assist multimodal cues to clear boluses from the oral cavity and to initiate swallow in a timely manner over 3 consecutive sessions prior to completion of objective swallow study.  SLP Short Term Goal 3 (Week 3): Pt will initiate a basic,familiar task with max assist verbal cues over 75% of observable opportunities.  SLP Short Term Goal 4 (Week 3): Pt will visually scan to midline in >25% of observable opportunities during basic, functional tasks with mod verbal cues.  SLP Short Term Goal 5 (Week 3): Pt will vocalize to produce vowels in isolation with max assist multimodal cues.    Skilled Therapeutic Interventions:  Pt was seen for skilled ST targeting cognitive goals.  Upon arrival, pt was partially reclined in bed, asleep, but easily awakened to voice and light touch.  SLP facilitated the session with trials of POs to continue working towards initiation of PO diet.  Following completion of oral care, pt consumed ice chips and demonstrated immediate, wet congested cough on 2 out of 5 trials and delayed dry, reflexive cough on 1 out of 5 trials.  SLP also facilitated the session with trials of purees to assess airway protection with thicker viscosities.  No overt s/s of aspiration were evident across (3) 1/2 teaspoons of applesauce although pt continues to present with delayed swallow initiation.  Throughout session, pt was able to answer basic, immediate yes/no questions regarding  her needs/wants with mod assist verbal and visual cues.  Pt was returned to room and left upright in wheelchair with call bell within reach.  Pt returned demonstration of soft touch call bell with min-mod verbal and visual cues.  Continue per current plan of care.     Function:  Eating Eating     Eating Assist Level: Help with picking up utensils;Help managing cup/glass;Helper scoops food on utensil (with trials )     Helper Scoops Food on Utensil: Every scoop Helper Brings Food to Mouth: Every scoop   Cognition Comprehension Comprehension assist level: Understands basic 25 - 49% of the time/ requires cueing 50 - 75% of the time  Expression   Expression assist level: Expresses basis less than 25% of the time/requires cueing >75% of the time.  Social Interaction Social Interaction assist level: Interacts appropriately less than 25% of the time. May be withdrawn or combative.  Problem Solving Problem solving assist level: Solves basic less than 25% of the time - needs direction nearly all the time or does not effectively solve problems and may need a restraint for safety  Memory Memory assist level: Recognizes or recalls less than 25% of the time/requires cueing greater than 75% of the time    Pain Pain Assessment Pain Assessment: No/denies pain  Therapy/Group: Individual Therapy  Matisse Roskelley, Melanee Spry 12/10/2014, 12:16 PM

## 2014-12-10 NOTE — Plan of Care (Signed)
Problem: RH Eating Goal: LTG Patient will perform eating w/assist, cues/equip (OT) LTG: Patient will perform eating with assist, with/without cues using equipment (OT)  Outcome: Not Applicable Date Met:  45/85/92 D/C due to SLP not recommending diet at this time.  Problem: RH Bathing Goal: LTG Patient will bathe with assist, cues/equipment (OT) LTG: Patient will bathe specified number of body parts with assist with/without cues using equipment (position) (OT)  Downgraded due to slow progress, due to decreased initiation and motor apraxia.  Problem: RH Toileting Goal: LTG Patient will perform toileting w/assist, cues/equip (OT) LTG: Patient will perform toiletiing (clothes management/hygiene) with assist, with/without cues using equipment (OT)  Downgraded due to slow progress, due to decreased initiation and motor apraxia.  Problem: RH Tub/Shower Transfers Goal: LTG Patient will perform tub/shower transfers w/assist (OT) LTG: Patient will perform tub/shower transfers with assist, with/without cues using equipment (OT)  Outcome: Not Applicable Date Met:  92/44/62 D/C due to not a focus at this time.

## 2014-12-10 NOTE — Progress Notes (Signed)
Occupational Therapy Weekly Progress Note  Patient Details  Name: [name] MRN: [id number] Date of Birth: [birth year]  Beginning of progress report period: December 04, 2014 End of progress report period: December 10, 2014  Today's Date: 12/10/2014 OT Individual Time: 0900-1030 OT Individual Time Calculation (min): 90 min    Patient has met 2 of 4 short term goals.  Pt is making slow progress towards goals.  Pt is demonstrating increased initiation with grooming tasks, sit <> stand, and transfers.  Pt continues to require max-total assist with self-care tasks due to decreased initiation, apraxia, and ability to complete tasks fully.  When provided increased time for initiation and UE support, pt able to complete sit > stand with min assist and min cues.  Pt continues to be nonverbal, however does communicate yes/no with head nods fairly consistently.  Patient continues to demonstrate the following deficits: Rt inattention, decreased initiation, motor apraxia, decreased attention to task, RUE weakness, impaired dynamic sitting and standing balance and therefore will continue to benefit from skilled OT intervention to enhance overall performance with BADL and Reduce care partner burden.  Patient not progressing toward long term goals.  See goal revision..  Plan of care revisions: downgraded toileting and bathing to mod assist.  Also discontinued self-feeding and shower goals.  OT Short Term Goals Week 2:  OT Short Term Goal 1 (Week 2): Pt will complete 2 grooming tasks with no more than mod demonstrational cueing for initiation and completion. OT Short Term Goal 1 - Progress (Week 2): Met OT Short Term Goal 2 (Week 2): Pt will perform UB bathing with mod assist sitting supported. OT Short Term Goal 2 - Progress (Week 2): Progressing toward goal OT Short Term Goal 3 (Week 2): Pt will use the RUE for washing face or UB with no more than mod assist. OT Short Term Goal 3 - Progress (Week 2):  Met OT Short Term Goal 4 (Week 2): Pt will perform toilet transfer stand pivot with no more than mod assist.  OT Short Term Goal 4 - Progress (Week 2): Progressing toward goal Week 3:  OT Short Term Goal 1 (Week 3): Pt will perform UB bathing with mod assist sitting supported. OT Short Term Goal 2 (Week 3): Pt will complete UB dressing with mod assist  OT Short Term Goal 3 (Week 3): Pt will complete LB dressing with max assist at sit > stand level OT Short Term Goal 4 (Week 3): Pt will perform toilet transfer stand pivot with no more than mod assist.   Skilled Therapeutic Interventions/Progress Updates:    ADL retraining with focus on initiation, sequencing, and problem solving during self-care task.  Spoke with PA to see if medically able to engage in bathing at shower level, with PA okay with shower.  Engaged in bathing at shower level to facilitate increased initiation in typical setup for bathing.  Stand pivot transfer w/c > tub bench in room shower with +2 for safety with max cues for stepping and manual facilitation for weight shift.  Pt with increased initiation with washing BLE.  Hand over hand to initiate washing chest with pt unable to complete task despite cues and assist to initiate task.  Dressing completed at sink to provide visual input to assist with sequencing and problem solving.  Pt initiated threading both sleeve, able to complete Lt sleeve with max cues and able to pull shirt over head with physical assist for guiding shirt.  Cues for brushing hair with pt  immediately grabbing hair brush and bringing it to bangs but unable to complete task.  Engaged in table top task with pegs with focus on initiation, following 2 step directions, use of RUE, and attending to Rt visual field.  Therapy Documentation Precautions:  Precautions Precautions: Fall Precaution Comments: fall, NPO, pusher to the right at times, non-verbal,  Restrictions Weight Bearing Restrictions: No General:   Vital  Signs: Therapy Vitals Temp: 98.4 F (36.9 C) Temp Source: Oral Pulse Rate: 92 Resp: 16 BP: (!) 134/92 mmHg Patient Position (if appropriate): Lying Oxygen Therapy SpO2: 99 % O2 Device: Nasal Cannula O2 Flow Rate (L/min): 3 L/min Pain:  Pt with no c/o pain  See Function Navigator for Current Functional Status.   Therapy/Group: Individual Therapy  Simonne Come 12/10/2014, 7:19 AM

## 2014-12-10 NOTE — Progress Notes (Signed)
Hypoglycemic Event  CBG: 66  Treatment:none  Symptoms: none  Follow-up CBG: Time: CBG Result:  Possible Reasons for Event: insufficient input   Comments/MD notified:Dan Anguilli PAC    Stefon Ramthun, Doree Fudge S  Remember to initiate Hypoglycemia Order Set & complete

## 2014-12-10 NOTE — Progress Notes (Signed)
Subjective/Complaints:  Not converted to bolus feeds as pt has J tube, but now on nocturnal feeds  ROS cannot obtain due to global aphasia   Objective: Vital Signs: Blood pressure 134/92, pulse 92, temperature 98.4 F (36.9 C), temperature source Oral, resp. rate 16, weight 72.4 kg (159 lb 9.8 oz), SpO2 99 %. No results found. Results for orders placed or performed during the hospital encounter of 11/25/14 (from the past 72 hour(s))  Glucose, capillary     Status: None   Collection Time: 12/07/14 12:02 PM  Result Value Ref Range   Glucose-Capillary 77 65 - 99 mg/dL  Glucose, capillary     Status: Abnormal   Collection Time: 12/07/14  6:05 PM  Result Value Ref Range   Glucose-Capillary 108 (H) 65 - 99 mg/dL  Renal function panel     Status: Abnormal   Collection Time: 12/07/14  8:34 PM  Result Value Ref Range   Sodium 131 (L) 135 - 145 mmol/L   Potassium 3.9 3.5 - 5.1 mmol/L   Chloride 90 (L) 101 - 111 mmol/L   CO2 29 22 - 32 mmol/L   Glucose, Bld 92 65 - 99 mg/dL   BUN 58 (H) 6 - 20 mg/dL   Creatinine, Ser 6.25 (H) 0.44 - 1.00 mg/dL   Calcium 8.8 (L) 8.9 - 10.3 mg/dL   Phosphorus 3.8 2.5 - 4.6 mg/dL   Albumin 2.5 (L) 3.5 - 5.0 g/dL   GFR calc non Af Amer 7 (L) >60 mL/min   GFR calc Af Amer 8 (L) >60 mL/min    Comment: (NOTE) The eGFR has been calculated using the CKD EPI equation. This calculation has not been validated in all clinical situations. eGFR's persistently <60 mL/min signify possible Chronic Kidney Disease.    Anion gap 12 5 - 15  CBC     Status: Abnormal   Collection Time: 12/07/14  8:35 PM  Result Value Ref Range   WBC 2.9 (L) 4.0 - 10.5 K/uL   RBC 3.53 (L) 3.87 - 5.11 MIL/uL   Hemoglobin 9.6 (L) 12.0 - 15.0 g/dL   HCT 30.2 (L) 36.0 - 46.0 %   MCV 85.6 78.0 - 100.0 fL   MCH 27.2 26.0 - 34.0 pg   MCHC 31.8 30.0 - 36.0 g/dL   RDW 18.1 (H) 11.5 - 15.5 %   Platelets 297 150 - 400 K/uL  Glucose, capillary     Status: Abnormal   Collection Time:  12/08/14 12:12 AM  Result Value Ref Range   Glucose-Capillary 100 (H) 65 - 99 mg/dL  Glucose, capillary     Status: Abnormal   Collection Time: 12/08/14  6:17 AM  Result Value Ref Range   Glucose-Capillary 111 (H) 65 - 99 mg/dL  Glucose, capillary     Status: None   Collection Time: 12/08/14 11:57 AM  Result Value Ref Range   Glucose-Capillary 73 65 - 99 mg/dL  Glucose, capillary     Status: None   Collection Time: 12/08/14  6:06 PM  Result Value Ref Range   Glucose-Capillary 83 65 - 99 mg/dL  Glucose, capillary     Status: None   Collection Time: 12/09/14 12:04 AM  Result Value Ref Range   Glucose-Capillary 83 65 - 99 mg/dL   Comment 1 Notify RN   Glucose, capillary     Status: None   Collection Time: 12/09/14  6:48 AM  Result Value Ref Range   Glucose-Capillary 94 65 - 99 mg/dL   Comment  1 Notify RN   Glucose, capillary     Status: None   Collection Time: 12/09/14 11:39 AM  Result Value Ref Range   Glucose-Capillary 71 65 - 99 mg/dL   Comment 1 Notify RN   Renal function panel     Status: Abnormal   Collection Time: 12/09/14  5:45 PM  Result Value Ref Range   Sodium 129 (L) 135 - 145 mmol/L   Potassium 4.1 3.5 - 5.1 mmol/L   Chloride 86 (L) 101 - 111 mmol/L   CO2 28 22 - 32 mmol/L   Glucose, Bld 73 65 - 99 mg/dL   BUN 59 (H) 6 - 20 mg/dL   Creatinine, Ser 3.07 (H) 0.44 - 1.00 mg/dL   Calcium 9.4 8.9 - 85.6 mg/dL   Phosphorus 4.1 2.5 - 4.6 mg/dL   Albumin 2.5 (L) 3.5 - 5.0 g/dL   GFR calc non Af Amer 6 (L) >60 mL/min   GFR calc Af Amer 7 (L) >60 mL/min    Comment: (NOTE) The eGFR has been calculated using the CKD EPI equation. This calculation has not been validated in all clinical situations. eGFR's persistently <60 mL/min signify possible Chronic Kidney Disease.    Anion gap 15 5 - 15  CBC     Status: Abnormal   Collection Time: 12/09/14  5:45 PM  Result Value Ref Range   WBC 5.7 4.0 - 10.5 K/uL   RBC 3.56 (L) 3.87 - 5.11 MIL/uL   Hemoglobin 9.7 (L) 12.0 -  15.0 g/dL   HCT 87.9 (L) 30.2 - 10.7 %   MCV 85.7 78.0 - 100.0 fL   MCH 27.2 26.0 - 34.0 pg   MCHC 31.8 30.0 - 36.0 g/dL   RDW 33.1 (H) 55.2 - 08.3 %   Platelets 326 150 - 400 K/uL  Glucose, capillary     Status: None   Collection Time: 12/09/14  9:47 PM  Result Value Ref Range   Glucose-Capillary 97 65 - 99 mg/dL  Glucose, capillary     Status: Abnormal   Collection Time: 12/10/14 12:18 AM  Result Value Ref Range   Glucose-Capillary 110 (H) 65 - 99 mg/dL  Glucose, capillary     Status: Abnormal   Collection Time: 12/10/14  6:17 AM  Result Value Ref Range   Glucose-Capillary 108 (H) 65 - 99 mg/dL     HEENT: poor oral movements  facial diplegia, cannot open mouth to command Cardio: RRR and no murmur Resp: CTA B/L.no use of accesory muscles, No wheezes and no upper airway sounds, not able to cough on command GI: BS positive and NT, ND, prior miline abd incision with extensive scarring, scab with old sutures attached Extremity:  Pulses positive and No Edema Skin:   Other G tube site NT Neuro: Flat but very alert, Cranial Nerve Abnormalities bilateral central 7,Aphasic and Apraxic, nodding for Y/N questions, unable to test sensory due to aphasia but does withdraw to pinch Musc/Skel:  Other decrease ROM in BIlateral knees and Right shoulder and elbow due to tone Gen NAD   Assessment/Plan: 1. 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 Physiatrist is providing close team supervision and 24 hour management of active medical problems listed below. Physiatrist and rehab team continue to assess barriers to discharge/monitor patient progress toward functional and medical goals.  FIM: Function - Bathing Position: Wheelchair/chair at sink Body parts bathed by patient:  Right upper leg, Left upper leg Body parts bathed by helper: Right arm, Left arm, Chest, Abdomen, Front perineal area, Buttocks (only completed UB this session) Bathing not applicable: Right arm, Left arm, Chest, Abdomen (refused UB bathing) Assist Level: Touching or steadying assistance(Pt > 75%), Supervision or verbal cues  Function- Upper Body Dressing/Undressing What is the patient wearing?: Pull over shirt/dress Pull over shirt/dress - Perfomed by patient: Thread/unthread left sleeve Pull over shirt/dress - Perfomed by helper: Thread/unthread right sleeve, Put head through opening, Pull shirt over trunk Assist Level: Touching or steadying assistance(Pt > 75%) Set up : To obtain clothing/put away Function - Lower Body Dressing/Undressing What is the patient wearing?: Non-skid slipper socks, Pants Position: Wheelchair/chair at sink Pants- Performed by helper: Thread/unthread right pants leg, Thread/unthread left pants leg, Pull pants up/down Non-skid slipper socks- Performed by helper: Don/doff right sock, Don/doff left sock Assist Level: Touching or steadying assistance (Pt > 75%)  Function - Toileting Toileting activity did not occur: No continent bowel/bladder event Toileting steps completed by helper: Adjust clothing prior to toileting, Adjust clothing after toileting, Performs perineal hygiene Toileting Assistive Devices: Other (comment) (UE support on sink) Assist level: Touching or steadying assistance (Pt.75%)  Function - Air cabin crew transfer activity did not occur: Safety/medical concerns Toilet transfer assistive device: Drop arm commode Assist level to toilet: Maximal assist (Pt 25 - 49%/lift and lower) Assist level from toilet: Maximal assist (Pt 25 - 49%/lift and lower)  Function - Chair/bed transfer Chair/bed transfer method: Stand pivot Chair/bed transfer assist level: Moderate assist (Pt 50 - 74%/lift or lower) Chair/bed transfer assistive device:  Armrests, Bedrails Chair/bed transfer details: Manual facilitation for weight shifting, Verbal cues for sequencing  Function - Locomotion: Wheelchair Will patient use wheelchair at discharge?: Yes Wheelchair activity did not occur:  (Attempted hand-over-hand facilitation for w/c propulsion, visual demonstration, however no pt initiation evident) Assist Level: Dependent (Pt equals 0%) Function - Locomotion: Ambulation Ambulation activity did not occur: Safety/medical concerns (Attempted ambulation, however upon standing x3 reps pt unable to step (unable to determine if due to$Remove' \\apraxia'[id number]$  or pt fearful)) Assistive device: Hand held assist (+2 HHA) Max distance: 75 Assist level: 2 helpers Walk 10 feet activity did not occur: Safety/medical concerns Assist level: 2 helpers Walk 50 feet with 2 turns activity did not occur: Safety/medical concerns Assist level: 2 helpers Walk 150 feet activity did not occur: Safety/medical concerns Assist level: 2 helpers Walk 10 feet on uneven surfaces activity did not occur: Safety/medical concerns  Function - Comprehension Comprehension: Auditory Comprehension assist level: Understands basic 25 - 49% of the time/ requires cueing 50 - 75% of the time  Function - Expression Expression: Nonverbal Expression assistive device: Other (Comment) (head nods) Expression assist level: Expresses basis less than 25% of the time/requires cueing >75% of the time.  Function - Social Interaction Social Interaction assist level: Interacts appropriately less than 25% of the time. May be withdrawn or combative.  Function - Problem Solving Problem solving assist level: Solves basic less than 25% of the time - needs direction nearly all the time or does not effectively solve problems and may need a restraint for safety  Function - Memory Memory assist level: Recognizes or recalls less than 25% of the time/requires cueing greater than 75% of the time Patient normally able to  recall (first 3 days only): None of the above  Medical Problem List and Plan: 1. Functional  deficits secondary to left MCA infarct/respiratory failure. Right hemiparesis improving, now using Y/N nods 2. DVT Prophylaxis/Anticoagulation: SCDs. Monitor for any signs of DVT. 8/12 venous dopplers neg 3. Pain Management: Tylenol as needed, no apparent pain during PT 4. Dysphagia. Patient is nothing by mouth. Status post gastrostomy tube 08/12/2014 per interventional radiology for nutritional support. poor prognosis for improvement given severe bilateral deficits, do not anticipate d/c PEG during this admission, discussed with SLP not to spend much time with swallow   5. Neuropsych: This patient is not capable of making decisions on her own behalf. 6. Skin/Wound Care: Routine skin checks. PEG site maintenance. continue turning and nutrition, site looks good 7. Fluids/Electrolytes/Nutrition: Routine I&O. Adjust TF based on nutritional needs. Renal adjusting dialysis, complicated situation, Not a diabetic, getting CBGs because of TF, these have been normal, Dietary writing TF orders, hyponatremia managed by renal 8. End-stage renal disease with hemodialysis. Follow-up per renal services. Hemodialysis in PM after therapies to avoid conflict, Nepro for nutrition, hypoalbuminemia related to ESRD 9. Seizure prophylaxis. Keppra 500 mg twice a day, monitor drug toxicity. No seizures, continue current dose no evidence of excessive sedation 10. Hypothyroidism. Synthroid 11. Chronic anemia. Aranesp weekly with dialysis, hgb monitored in HD, nephro managing 12. History of asthma. Schedule nebulizers given aphasia. , no wheezing today, no volitional cough, secretions improving 13. Mood/anxiety. Continue Xanax on dialysis days 14.  Hx of recurrent resp failure, high risk for PNA given comorbid conditions,  RR increases at times. Comfortable. Secretions better. Afebrile. No distress.    -wean oxygen down to keep sats  >90% 15.  Bowel incont- monitor skin 16.  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

## 2014-12-11 ENCOUNTER — Inpatient Hospital Stay (HOSPITAL_COMMUNITY): Payer: Medicaid Other

## 2014-12-11 ENCOUNTER — Inpatient Hospital Stay (HOSPITAL_COMMUNITY): Payer: Medicaid Other | Admitting: Physical Therapy

## 2014-12-11 ENCOUNTER — Inpatient Hospital Stay (HOSPITAL_COMMUNITY): Payer: Medicaid Other | Admitting: Occupational Therapy

## 2014-12-11 ENCOUNTER — Inpatient Hospital Stay (HOSPITAL_COMMUNITY): Payer: Medicaid Other | Admitting: Speech Pathology

## 2014-12-11 LAB — GLUCOSE, CAPILLARY
GLUCOSE-CAPILLARY: 110 mg/dL — AB (ref 65–99)
GLUCOSE-CAPILLARY: 72 mg/dL (ref 65–99)
Glucose-Capillary: 101 mg/dL — ABNORMAL HIGH (ref 65–99)

## 2014-12-11 LAB — RENAL FUNCTION PANEL
ALBUMIN: 2.8 g/dL — AB (ref 3.5–5.0)
Anion gap: 10 (ref 5–15)
BUN: 35 mg/dL — AB (ref 6–20)
CALCIUM: 9 mg/dL (ref 8.9–10.3)
CO2: 29 mmol/L (ref 22–32)
CREATININE: 4.35 mg/dL — AB (ref 0.44–1.00)
Chloride: 95 mmol/L — ABNORMAL LOW (ref 101–111)
GFR calc Af Amer: 12 mL/min — ABNORMAL LOW (ref >60)
GFR, EST NON AFRICAN AMERICAN: 10 mL/min — AB (ref >60)
GLUCOSE: 104 mg/dL — AB (ref 65–99)
PHOSPHORUS: 2 mg/dL — AB (ref 2.5–4.6)
POTASSIUM: 3.7 mmol/L (ref 3.5–5.1)
SODIUM: 134 mmol/L — AB (ref 135–145)

## 2014-12-11 MED ORDER — HEPARIN SODIUM (PORCINE) 1000 UNIT/ML DIALYSIS: 1000.0000 [IU] | INTRAMUSCULAR | Status: DC | PRN

## 2014-12-11 MED ORDER — LIDOCAINE-PRILOCAINE 2.5-2.5 % EX CREA: 1.0000 "application " | TOPICAL_CREAM | CUTANEOUS | Status: DC | PRN

## 2014-12-11 MED ORDER — LIDOCAINE HCL (PF) 1 % IJ SOLN: 5.0000 mL | INTRAMUSCULAR | Status: DC | PRN

## 2014-12-11 MED ORDER — ALPRAZOLAM 0.5 MG PO TABS
0.5000 mg | ORAL_TABLET | ORAL | Status: DC
  Administered 2014-12-12 – 2014-12-24 (×6): 0.5 mg
  Filled 2014-12-11 (×7): qty 1

## 2014-12-11 MED ORDER — ALTEPLASE 2 MG IJ SOLR
2.0000 mg | Freq: Once | INTRAMUSCULAR | Status: DC | PRN
Start: 2014-12-11 — End: 2014-12-11

## 2014-12-11 MED ORDER — PENTAFLUOROPROP-TETRAFLUOROETH EX AERO
1.0000 | INHALATION_SPRAY | CUTANEOUS | Status: DC | PRN
Start: 2014-12-11 — End: 2014-12-11

## 2014-12-11 MED ORDER — HEPARIN SODIUM (PORCINE) 1000 UNIT/ML DIALYSIS
1000.0000 [IU] | INTRAMUSCULAR | Status: DC | PRN
  Filled 2014-12-11: qty 1

## 2014-12-11 MED ORDER — SODIUM CHLORIDE 0.9 % IV SOLN: 100.0000 mL | INTRAVENOUS | Status: DC | PRN

## 2014-12-11 MED ORDER — ALTEPLASE 2 MG IJ SOLR
2.0000 mg | Freq: Once | INTRAMUSCULAR | Status: DC | PRN
  Filled 2014-12-11: qty 2

## 2014-12-11 MED ORDER — PENTAFLUOROPROP-TETRAFLUOROETH EX AERO: 1.0000 "application " | INHALATION_SPRAY | CUTANEOUS | Status: DC | PRN

## 2014-12-11 MED ORDER — ALPRAZOLAM 0.5 MG PO TABS: 0.5000 mg | ORAL_TABLET | ORAL | Status: DC

## 2014-12-11 MED ORDER — DARBEPOETIN ALFA 100 MCG/0.5ML IJ SOSY
100.0000 ug | PREFILLED_SYRINGE | INTRAMUSCULAR | Status: DC
  Filled 2014-12-11: qty 0.5

## 2014-12-11 MED ORDER — LIDOCAINE-PRILOCAINE 2.5-2.5 % EX CREA
1.0000 "application " | TOPICAL_CREAM | CUTANEOUS | Status: DC | PRN
  Filled 2014-12-11: qty 5

## 2014-12-11 NOTE — Progress Notes (Signed)
RN attepted to wean pt to 1L O2 at approxiamately 1:30 pm and pt's O2 sats dropped to 73% while resting. RN put pt back at 2L O2 [place] and O2 sats were at 96% before RN left the room. Will continue to monitor pt's O2 sats.

## 2014-12-11 NOTE — Progress Notes (Signed)
Physical Therapy Session Note  Patient Details  Name: [name] MRN: [id number] Date of Birth: 1955/08/12  Today's Date: 12/11/2014 PT Individual Time: 0900-1000 and 1430-1500 PT Individual Time Calculation (min): 60 min and 30 min (total 90 min)   Short Term Goals: Week 3:  PT Short Term Goal 1 (Week 3): Pt will perform bed mobility supervision and min cues PT Short Term Goal 2 (Week 3): Pt will perform stand pivot transfer consistent minA with mod cues for sequencing PT Short Term Goal 3 (Week 3): Pt will perform gait with LRAD x75' with supervision overall PT Short Term Goal 4 (Week 3): Pt will perform 8 3-inch stairs with modA PT Short Term Goal 5 (Week 3): Pt will perform standing balance x5 min with BUE support and supervision  Skilled Therapeutic Interventions/Progress Updates:    0900-1000: Pt received seated in w/c; c/o pain in head but unable to rate. Performed stand pivot transfer w/c <> bed in simulated apartment x2 trials. ModA for lifting assist however improved initiation with UEs/LEs for task. Continues to demonstrate freezing during turn to transfer, requires repetitive cues and weight shifting to complete transfer. Sitting balance and UE NMR performed seated on edge of mat table. Performed ring toss with BUEs; performed LUE with minA/supervision improved over several trials, with RUE pt demonstrates difficulty with releasing grasp, resistant to tactile cueing. However, when pt told activity was over and therapist attempted to retrieve ring from pt's R hand, pt easily releases ring. Gait x100' with rollator and CGA with w/c follow. Verbal/visual cues for maintaining rollator closer to body. Pt sat in w/c and returned to room with totalA. Remained seated in w/c with all needs within reach and Nephrologist present at completion of session.   1430-1500: Pt received seated in w/c; no c/o pain and agreeable to treatment. Stair training x8 3" height with BUEs on handrails,  alternating LE lead leg with step-to pattern. Performed with minA, occasional cues for LE placement on step. Stand pivot transfer w/c >mat table modA with cues for sequencing. Seated on edge of mat table, performed catching/throwing beach ball. Initial trials require several attempts due to poor coordination and motor planning for releasing ball, however improved with additional trials. Stand pivot transfer with table elevated and CGA; no difficulty with foot progression or turning. Pt returned to room and remained seated in w/c with all needs within reach and QR belt intact at completion of session.     Therapy Documentation Precautions:  Precautions Precautions: Fall Precaution Comments: fall, NPO, pusher to the right at times, non-verbal,  Restrictions Weight Bearing Restrictions: No Pain: Pain Assessment Pain Assessment: Faces Pain Score: 2    See Function Navigator for Current Functional Status.   Therapy/Group: Individual Therapy  [name] 12/11/2014, 10:14 AM

## 2014-12-11 NOTE — Progress Notes (Signed)
[facility] Progress Note   Subjective: no complaints  Filed Vitals:   12/10/14 1437 12/10/14 1926 12/11/14 0500 12/11/14 0859  BP: 122/81  110/65   Pulse: 93  101 110  Temp: 97.9 F (36.6 C)  98.3 F (36.8 C)   TempSrc: Oral  Oral   Resp: 16  19   Weight:   71.5 kg (157 lb 10.1 oz)   SpO2: 100% 94% 99% 99%   Exam: Alert, up in WC , aphasic , nonverbal, nods heads to questions No jvd Chest rales L base, R clear RRR  Abd soft ntnd PEG tube LUQ No LE edema LUA AVF +bruit Neuro R hemiparesis , using R arm more than before  TTS [employer] 4h 2/2 Bath 91kg Heparin none Hect 2 ug Aranesp 200 ug/wk + venofer 50/wk      Assessment: 1 CVA- with R hemiparesis and aphasia.- now in rehab PT/OT. I discussed EOL issues with her today and she is not thinking about giving up or stopping dialysis. Wants to do everything she can to get better, but is DNR status.   2 ESRD TTS hd via AVF 3 Anemia - Hbg 9.5- aranesp q tuesday 4 HPTH - phos 4.3- fosrenol/Ca+ 10.1 corrected- hectorol on hold  5 Vol down 20kg from prior dry wt 6 BP - normotensive, no meds 7 Nutrition - NPO. Alb 2.5. nepro per gtube/ vitamin/ 8 Hx PAT - no documented hx of afib. Not on any medication for this now.   Plan - HD today then short HD Sat to get back on schedule. DC O2. Get CXR and check room air saO2.    [name] MD  pager [phone]    cell [phone]  12/11/2014, 10:13 AM     Recent Labs Lab 12/04/14 1958 12/07/14 2034 12/09/14 1745  NA 134* 131* 129*  K 4.1 3.9 4.1  CL 92* 90* 86*  CO2 GLUCOSE 93 92 73  BUN 57* 58* 59*  CREATININE 6.93* 6.25* 6.60*  CALCIUM 9.0 8.8* 9.4  PHOS 4.3 3.8 4.1    Recent Labs Lab 12/04/14 1958 12/07/14 2034 12/09/14 1745  ALBUMIN 2.5* 2.5* 2.5*    Recent Labs Lab 12/04/14 1958 12/07/14 2035 12/09/14 1745  WBC 8.2 2.9* 5.7  HGB 9.5* 9.6* 9.7*  HCT 30.3* 30.2* 30.5*  MCV 86.8 85.6 85.7  PLT 321 297 326   .  ALPRAZolam  0.5 mg Per Tube Q M,W,F-HD  . antiseptic oral rinse  7 mL Mouth Rinse TID  . aspirin  325 mg Oral Daily  . budesonide  0.25 mg Nebulization BID  . chlorhexidine  15 mL Mouth/Throat BID  . darbepoetin (ARANESP) injection - DIALYSIS  100 mcg Intravenous Q Wed-HD  . free water  50 mL Per Tube TID  . guaifenesin  200 mg Per Tube TID  . ipratropium-albuterol  3 mL Nebulization TID  . lanolin   Topical BID  . lanthanum  500 mg Per Tube TID  . levETIRAcetam  500 mg Oral BID  . levothyroxine  25 mcg Oral QAC breakfast  . methylphenidate  10 mg Per Tube BID WC  . mirtazapine  7.5 mg Per Tube QHS  . multivitamin  1 tablet Oral QHS  . pantoprazole sodium  40 mg Per Tube Q1200   . feeding supplement (NEPRO CARB STEADY) 1,000 mL (12/11/14 0534)   sodium chloride, sodium chloride, sodium chloride, acetaminophen, acetylcysteine, ALPRAZolam, alteplase, atropine, heparin, ipratropium-albuterol, lidocaine (PF), lidocaine-prilocaine, ondansetron **  OR** ondansetron (ZOFRAN) IV, pentafluoroprop-tetrafluoroeth, sorbitol

## 2014-12-11 NOTE — Progress Notes (Signed)
Occupational Therapy Session Note  Patient Details  Name: [name] MRN: [id number] Date of Birth: 02-Jul-1955  Today's Date: 12/11/2014 OT Individual Time: 0800-0900 OT Individual Time Calculation (min): 60 min    Short Term Goals: Week 3:  OT Short Term Goal 1 (Week 3): Pt will perform UB bathing with mod assist sitting supported. OT Short Term Goal 2 (Week 3): Pt will complete UB dressing with mod assist  OT Short Term Goal 3 (Week 3): Pt will complete LB dressing with max assist at sit > stand level OT Short Term Goal 4 (Week 3): Pt will perform toilet transfer stand pivot with no more than mod assist.   Skilled Therapeutic Interventions/Progress Updates:    ADL retraining with focus on initiation, sequencing, sit > stand, and increased communication.  Pt in bed upon arrival, performed perineal hygiene at bed level with pt rolling Rt and Lt with supervision with use of bed rails for therapist to perform hygiene.  Pt with improved initiation with bed mobility and dressing tasks, however due to decreased attention and motor apraxia pt with difficulty completing tasks.  Pt selected clothing this session with head nods and pointing, immediately initiating threading bra and shirt but requiring assist to complete Rt side and fasten, pt also initiating pulling shirt over head but with decreased ability to complete task.  Grooming completed in sitting at sink with assist to correctly place hair brush in Rt hand, followed by pt attempting to comb front of hair - requiring assist to complete task.  Pt attempting to communicate more with therapist this session, with grabbing therapist's hand and squeezing but no verbalizations or gestures.  Therapy Documentation Precautions:  Precautions Precautions: Fall Precaution Comments: fall, NPO, pusher to the right at times, non-verbal,  Restrictions Weight Bearing Restrictions: No General:   Vital Signs: Therapy Vitals Pulse Rate: (!) 110 Oxygen  Therapy SpO2: 99 % O2 Device: Nasal Cannula O2 Flow Rate (L/min): 2 L/min Pain:  Pt with head nod "yes" when asked about pain but difficult to assess due to aphasia and no facial grimaces with movement  See Function Navigator for Current Functional Status.   Therapy/Group: Individual Therapy  Rosalio Loud 12/11/2014, 9:26 AM

## 2014-12-11 NOTE — Progress Notes (Signed)
Speech Language Pathology Daily Session Note  Patient Details  Name: [name] MRN: [id number] Date of Birth: Feb 21, 1956  Today's Date: 12/11/2014 SLP Individual Time: 1100-1200 SLP Individual Time Calculation (min): 60 min  Short Term Goals: Week 3: SLP Short Term Goal 1 (Week 3): Pt will improve management of her secretions as evidenced by ability to orally expectorate secretions and anteriorly contain her saliva with max assist multimodal cues.   SLP Short Term Goal 2 (Week 3): Pt will consume PO trials with max assist multimodal cues to clear boluses from the oral cavity and to initiate swallow in a timely manner over 3 consecutive sessions prior to completion of objective swallow study.  SLP Short Term Goal 3 (Week 3): Pt will initiate a basic,familiar task with max assist verbal cues over 75% of observable opportunities.  SLP Short Term Goal 4 (Week 3): Pt will visually scan to midline in >25% of observable opportunities during basic, functional tasks with mod verbal cues.  SLP Short Term Goal 5 (Week 3): Pt will vocalize to produce vowels in isolation with max assist multimodal cues.    Skilled Therapeutic Interventions:  Pt was seen for skilled ST targeting cognitive-linguistic goals.  Upon arrival, pt was seated upright in recliner, awake, flat affect, but nodded head yes in agreement to participate in ST.  Shortly after beginning session, SLP noted pt with grimace.  Pt indicated via head nods and shakes to basic yes/no questions that she had been incontinent and needed to be cleaned up.   Pt was transported back to room and transferred back to bed with assistance from nurse tech.  Pt was noted to initiate assistance during transfer with extra time and min verbal cues.  Pt followed 1 and 2 step commands to assist in hygiene and donning a clean brief with min assist multimodal cues.  When transferred back to wheelchair, pt was noted to be very tearful and would repeatedly reach for and  hold SLP's hand, even when SLP was out of pt's field of vision.  Pt appeared to want to communicate something to SLP but remained nonverbal and was unable to convey her feelings/wants/needs to SLP via yes/no responses.   Pt was left in wheelchair with quick release belt donned and all needs within reach. Continue per current plan of care.    Function:  Eating Eating           Cognition Comprehension Comprehension assist level: Understands basic 25 - 49% of the time/ requires cueing 50 - 75% of the time  Expression Expression assistive device: Other (Comment) (nods/gestures) Expression assist level: Expresses basis less than 25% of the time/requires cueing >75% of the time.  Social Interaction Social Interaction assist level: Interacts appropriately 25 - 49% of time - Needs frequent redirection.  Problem Solving Problem solving assist level: Solves basic 25 - 49% of the time - needs direction more than half the time to initiate, plan or complete simple activities  Memory Memory assist level: Recognizes or recalls 25 - 49% of the time/requires cueing 50 - 75% of the time    Pain Pain Assessment Pain Assessment: Faces Faces Pain Scale: No hurt  Therapy/Group: Individual Therapy  [name], Melanee Spry 12/11/2014, 4:28 PM

## 2014-12-11 NOTE — Progress Notes (Signed)
Subjective/Complaints: Pt nodded yes to pain but couldn't ID site via Y/N nods Had removal of HD cath yesterday Discussed with OT, starting ADLs this am  ROS cannot obtain due to global aphasia   Objective: Vital Signs: Blood pressure 110/65, pulse 101, temperature 98.3 F (36.8 C), temperature source Oral, resp. rate 19, weight 71.5 kg (157 lb 10.1 oz), SpO2 99 %. Ir Removal Tun Cv Cath W/o Fl  12/10/2014   CLINICAL DATA:  Renal failure. The patient requires removal of a tunneled dialysis catheter. The catheter is no longer needed. No evidence of infection.  EXAM: REMOVAL OF TUNNELED CENTRAL VENOUS CATHETER  PROCEDURE: The right chest dialysis catheter site was prepped with chlorhexidine. A sterile gown and gloves were worn during the procedure. Local anesthesia was provided with 1% lidocaine.  Utilizing sharp and blunt dissection, the subcutaneous cuff of the dialysis catheter was freed. The catheter was then successfully removed in its entirety. A sterile dressing was applied over the catheter exit site.  IMPRESSION: Removal of tunneled dialysis catheter utilizing sharp and blunt dissection. The procedure was uncomplicated.   Electronically Signed   By: Malachy Moan M.D.   On: 12/10/2014 13:18   Results for orders placed or performed during the hospital encounter of 11/25/14 (from the past 72 hour(s))  Glucose, capillary     Status: None   Collection Time: 12/08/14 11:57 AM  Result Value Ref Range   Glucose-Capillary 73 65 - 99 mg/dL  Glucose, capillary     Status: None   Collection Time: 12/08/14  6:06 PM  Result Value Ref Range   Glucose-Capillary 83 65 - 99 mg/dL  Glucose, capillary     Status: None   Collection Time: 12/09/14 12:04 AM  Result Value Ref Range   Glucose-Capillary 83 65 - 99 mg/dL   Comment 1 Notify RN   Glucose, capillary     Status: None   Collection Time: 12/09/14  6:48 AM  Result Value Ref Range   Glucose-Capillary 94 65 - 99 mg/dL   Comment 1 Notify  RN   Glucose, capillary     Status: None   Collection Time: 12/09/14 11:39 AM  Result Value Ref Range   Glucose-Capillary 71 65 - 99 mg/dL   Comment 1 Notify RN   Renal function panel     Status: Abnormal   Collection Time: 12/09/14  5:45 PM  Result Value Ref Range   Sodium 129 (L) 135 - 145 mmol/L   Potassium 4.1 3.5 - 5.1 mmol/L   Chloride 86 (L) 101 - 111 mmol/L   CO2 28 22 - 32 mmol/L   Glucose, Bld 73 65 - 99 mg/dL   BUN 59 (H) 6 - 20 mg/dL   Creatinine, Ser 6.10 (H) 0.44 - 1.00 mg/dL   Calcium 9.4 8.9 - 04.2 mg/dL   Phosphorus 4.1 2.5 - 4.6 mg/dL   Albumin 2.5 (L) 3.5 - 5.0 g/dL   GFR calc non Af Amer 6 (L) >60 mL/min   GFR calc Af Amer 7 (L) >60 mL/min    Comment: (NOTE) The eGFR has been calculated using the CKD EPI equation. This calculation has not been validated in all clinical situations. eGFR's persistently <60 mL/min signify possible Chronic Kidney Disease.    Anion gap 15 5 - 15  CBC     Status: Abnormal   Collection Time: 12/09/14  5:45 PM  Result Value Ref Range   WBC 5.7 4.0 - 10.5 K/uL   RBC 3.56 (L)  3.87 - 5.11 MIL/uL   Hemoglobin 9.7 (L) 12.0 - 15.0 g/dL   HCT 30.5 (L) 36.0 - 46.0 %   MCV 85.7 78.0 - 100.0 fL   MCH 27.2 26.0 - 34.0 pg   MCHC 31.8 30.0 - 36.0 g/dL   RDW 18.0 (H) 11.5 - 15.5 %   Platelets 326 150 - 400 K/uL  Glucose, capillary     Status: None   Collection Time: 12/09/14  9:47 PM  Result Value Ref Range   Glucose-Capillary 97 65 - 99 mg/dL  Glucose, capillary     Status: Abnormal   Collection Time: 12/10/14 12:18 AM  Result Value Ref Range   Glucose-Capillary 110 (H) 65 - 99 mg/dL  Glucose, capillary     Status: Abnormal   Collection Time: 12/10/14  6:17 AM  Result Value Ref Range   Glucose-Capillary 108 (H) 65 - 99 mg/dL  Glucose, capillary     Status: None   Collection Time: 12/10/14 12:32 PM  Result Value Ref Range   Glucose-Capillary 66 65 - 99 mg/dL  Glucose, capillary     Status: None   Collection Time: 12/10/14  2:08  PM  Result Value Ref Range   Glucose-Capillary 68 65 - 99 mg/dL  Glucose, capillary     Status: None   Collection Time: 12/10/14  6:47 PM  Result Value Ref Range   Glucose-Capillary 93 65 - 99 mg/dL  Glucose, capillary     Status: Abnormal   Collection Time: 12/10/14 11:52 PM  Result Value Ref Range   Glucose-Capillary 101 (H) 65 - 99 mg/dL   Comment 1 Notify RN   Glucose, capillary     Status: Abnormal   Collection Time: 12/11/14  5:42 AM  Result Value Ref Range   Glucose-Capillary 110 (H) 65 - 99 mg/dL     HEENT: poor oral movements  facial diplegia, cannot open mouth to command but opens spont Cardio: RRR and no murmur, no pain to palp of chest wall Resp: CTA B/L.no use of accesory muscles, No wheezes and no upper airway sounds, not able to cough on command GI: BS positive and NT, ND, prior miline abd incision with extensive scarring, scab with old sutures attached Extremity:  Pulses positive and No Edema Skin:   Other G tube site NT Neuro: Flat but very alert, Cranial Nerve Abnormalities bilateral central 7,Aphasic and Apraxic, nodding for Y/N questions, unable to test sensory due to aphasia but does withdraw to pinch Musc/Skel:  Other decrease ROM in BIlateral knees and Right shoulder and elbow due to tone, no pain with UE or LE ROM Gen NAD   Assessment/Plan: 1. 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 Physiatrist is providing close team supervision and 24 hour management of active medical problems listed below. Physiatrist and rehab team continue to assess barriers to discharge/monitor patient progress toward functional and medical goals.   FIM: Function - Bathing Position: Shower Body parts bathed by patient: Right upper leg, Left upper leg Body parts  bathed by helper: Right arm, Left arm, Chest, Abdomen, Front perineal area, Buttocks, Right lower leg, Left lower leg, Back Bathing not applicable: Right arm, Left arm, Chest, Abdomen (refused UB bathing) Assist Level: Touching or steadying assistance(Pt > 75%), Supervision or verbal cues (Total assist)  Function- Upper Body Dressing/Undressing What is the  patient wearing?: Pull over shirt/dress Pull over shirt/dress - Perfomed by patient: Thread/unthread left sleeve Pull over shirt/dress - Perfomed by helper: Thread/unthread right sleeve, Pull shirt over trunk, Put head through opening Assist Level: Touching or steadying assistance(Pt > 75%) Set up : To obtain clothing/put away Function - Lower Body Dressing/Undressing What is the patient wearing?: Non-skid slipper socks, Pants Position: Wheelchair/chair at sink Pants- Performed by helper: Thread/unthread right pants leg, Thread/unthread left pants leg, Pull pants up/down Non-skid slipper socks- Performed by helper: Don/doff right sock, Don/doff left sock Assist Level: Touching or steadying assistance (Pt > 75%)  Function - Toileting Toileting activity did not occur: No continent bowel/bladder event Toileting steps completed by helper: Adjust clothing prior to toileting, Adjust clothing after toileting, Performs perineal hygiene Toileting Assistive Devices: Other (comment) (UE support on sink) Assist level: Touching or steadying assistance (Pt.75%)  Function - Air cabin crew transfer activity did not occur: Safety/medical concerns Toilet transfer assistive device: Drop arm commode Assist level to toilet: Maximal assist (Pt 25 - 49%/lift and lower) Assist level from toilet: Maximal assist (Pt 25 - 49%/lift and lower)  Function - Chair/bed transfer Chair/bed transfer method: Stand pivot Chair/bed transfer assist level: Moderate assist (Pt 50 - 74%/lift or lower) Chair/bed transfer assistive device: Armrests,  Bedrails Chair/bed transfer details: Manual facilitation for weight shifting, Verbal cues for sequencing  Function - Locomotion: Wheelchair Will patient use wheelchair at discharge?: Yes Wheelchair activity did not occur:  (Attempted hand-over-hand facilitation for w/c propulsion, visual demonstration, however no pt initiation evident) Assist Level: Dependent (Pt equals 0%) Function - Locomotion: Ambulation Ambulation activity did not occur: Safety/medical concerns (Attempted ambulation, however upon standing x3 reps pt unable to step (unable to determine if due to$Remove' \\[id number]$  or pt fearful)) Assistive device: Other (comment) (rollator) Max distance: 50 Assist level: Touching or steadying assistance (Pt > 75%) (second helper for managing O2) Walk 10 feet activity did not occur: Safety/medical concerns Assist level: Touching or steadying assistance (Pt > 75%) Walk 50 feet with 2 turns activity did not occur: Safety/medical concerns Assist level: Touching or steadying assistance (Pt > 75%) Walk 150 feet activity did not occur: Safety/medical concerns Assist level: 2 helpers Walk 10 feet on uneven surfaces activity did not occur: Safety/medical concerns  Function - Comprehension Comprehension: Auditory Comprehension assist level: Understands basic 25 - 49% of the time/ requires cueing 50 - 75% of the time  Function - Expression Expression: Nonverbal Expression assistive device: Other (Comment) (head nods) Expression assist level: Expresses basis less than 25% of the time/requires cueing >75% of the time.  Function - Social Interaction Social Interaction assist level: Interacts appropriately less than 25% of the time. May be withdrawn or combative.  Function - Problem Solving Problem solving assist level: Solves basic less than 25% of the time - needs direction nearly all the time or does not effectively solve problems and may need a restraint for safety  Function - Memory Memory assist  level: Recognizes or recalls less than 25% of the time/requires cueing greater than 75% of the time (difficult to assess due tp global aphasia ) Patient normally able to recall (first 3 days only): None of the above  Medical Problem List and Plan: 1. Functional deficits secondary to left MCA infarct/respiratory failure. Right hemiparesis improving, now using Y/N nods 2. DVT Prophylaxis/Anticoagulation: SCDs. Monitor for any signs of DVT. 8/12 venous dopplers neg, mobility increasing so risk is as well 3. Pain Management: Tylenol as needed, no apparent pain during PT  4. Dysphagia. Patient is nothing by mouth. Status post jejunostomy tube 08/12/2014 per interventional radiology for nutritional support. poor prognosis for improvement given severe bilateral deficits, do not anticipate d/c PEG during this admission,  5. Neuropsych: This patient is not capable of making decisions on her own behalf. 6. Skin/Wound Care: Routine skin checks. J tube site maintenance. continue turning and nutrition, site looks good 7. Fluids/Electrolytes/Nutrition: Routine I&O. Adjust TF based on nutritional needs. Renal adjusting dialysis, complicated situation, Not a diabetic, getting CBGs because of TF, these have been normal, Dietary writing TF orders, hyponatremia managed by renal 8. End-stage renal disease with hemodialysis. Follow-up per renal services. Hemodialysis in PM after therapies to avoid conflict, Nepro for nutrition, hypoalbuminemia related to ESRD 9. Seizure prophylaxis. Keppra 500 mg twice a day, monitor drug toxicity. No seizures, continue current dose no evidence of excessive sedation 10. Hypothyroidism. Synthroid 11. Chronic anemia. Aranesp weekly with dialysis, hgb monitored in HD, nephro managing 12. History of asthma. Schedule nebulizers given aphasia. , no wheezing today, no volitional cough, secretions improving 13. Mood/anxiety. Continue Xanax on dialysis days 14.  Hx of recurrent resp failure,  high risk for PNA given comorbid conditions,  RR increases at times. Comfortable. Secretions better. Afebrile. No distress.    -wean oxygen down to keep sats >90% 15.  Bowel incont- monitor skin 16.  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

## 2014-12-12 ENCOUNTER — Inpatient Hospital Stay (HOSPITAL_COMMUNITY): Payer: Medicaid Other

## 2014-12-12 ENCOUNTER — Inpatient Hospital Stay (HOSPITAL_COMMUNITY): Payer: Medicaid Other | Admitting: Physical Therapy

## 2014-12-12 LAB — GLUCOSE, CAPILLARY
GLUCOSE-CAPILLARY: 101 mg/dL — AB (ref 65–99)
GLUCOSE-CAPILLARY: 72 mg/dL (ref 65–99)
Glucose-Capillary: 117 mg/dL — ABNORMAL HIGH (ref 65–99)
Glucose-Capillary: 86 mg/dL (ref 65–99)

## 2014-12-12 MED ORDER — SODIUM BICARBONATE 650 MG PO TABS: 650.0000 mg | ORAL_TABLET | Freq: Once | ORAL | Status: DC

## 2014-12-12 MED ORDER — PANCRELIPASE (LIP-PROT-AMYL) 12000-38000 UNITS PO CPEP
2.0000 | ORAL_CAPSULE | Freq: Once | ORAL | Status: DC
  Filled 2014-12-12: qty 2

## 2014-12-12 NOTE — Progress Notes (Signed)
Physical Therapy Session Note  Patient Details  Name: [name] MRN: [id number] Date of Birth: 1955-07-07  Today's Date: 12/12/2014 PT Individual Time: 0900-1000 PT Individual Time Calculation (min): 60 min   Short Term Goals: Week 1:  PT Short Term Goal 1 (Week 1): Will perform bed mobility with modA and use of bedrails PT Short Term Goal 1 - Progress (Week 1): Met PT Short Term Goal 2 (Week 1): Will perform supine <>sit maxA +1 and mod cues PT Short Term Goal 2 - Progress (Week 1): Met PT Short Term Goal 3 (Week 1): Will perform stand pivot transfer modA PT Short Term Goal 3 - Progress (Week 1): Met PT Short Term Goal 4 (Week 1): Will perform ambulation x25' with LRAD and maxA +1 PT Short Term Goal 4 - Progress (Week 1): Not met PT Short Term Goal 5 (Week 1): Will perform w/c propulsion x50' with modA PT Short Term Goal 5 - Progress (Week 1): Not met Week 2:  PT Short Term Goal 1 (Week 2): Pt will perform bed mobility minA and min cues, with bedrails PT Short Term Goal 1 - Progress (Week 2): Met PT Short Term Goal 2 (Week 2): Will perform stand pivot transfer w/c <> bed with consistent minA and min cues PT Short Term Goal 2 - Progress (Week 2): Not met PT Short Term Goal 3 (Week 2): Pt will perform ambulation x50' and modA +1 PT Short Term Goal 3 - Progress (Week 2): Met PT Short Term Goal 4 (Week 2): Pt will perform ascent/descent of 8 3-inch stairs with maxA +1 PT Short Term Goal 4 - Progress (Week 2): Not met PT Short Term Goal 5 (Week 2): Pt will perform w/c propulsion x50' with modA and max verbal cues PT Short Term Goal 5 - Progress (Week 2): Not met Week 3:  PT Short Term Goal 1 (Week 3): Pt will perform bed mobility supervision and min cues PT Short Term Goal 2 (Week 3): Pt will perform stand pivot transfer consistent minA with mod cues for sequencing PT Short Term Goal 3 (Week 3): Pt will perform gait with LRAD x75' with supervision overall PT Short Term Goal 4 (Week  3): Pt will perform 8 3-inch stairs with modA PT Short Term Goal 5 (Week 3): Pt will perform standing balance x5 min with BUE support and supervision  Skilled Therapeutic Interventions/Progress Updates:   Pt limited by behavioral management needs throughout session. Pt most excited for initiating gait without AD, deferring longer gait trials with RW. Pt would continue to benefit from skilled PT services to increase functional mobility.   Therapy Documentation Precautions:  Precautions Precautions: Fall Precaution Comments: fall, NPO, pusher to the right at times, non-verbal,  Restrictions Weight Bearing Restrictions: No Vital Signs: Therapy Vitals Pulse Rate: (!) 101 Patient Position (if appropriate): Sitting (s/p gait) Oxygen Therapy SpO2: 95 % Pain: Pain Assessment Pain Assessment: No/denies pain Mobility:  Min A for transfers with cues for weight shift, sequencing, and technique. Locomotion :   Mod A with HHA with cues for core control, attention, sequencing, and posture Other Treatments:  Pt educated on rehab plan, safety in mobility, and progressing mobility. Pt performs transfers x8 in session. Pt performs anterior weight shifts 2x10. Pt performs standing balance static 2'x3. Standing weight shifts 2x10. Pt behavior managed with low demands, low stim, gestures, and offering yes/no options or deals. Static unsupported sitting as active rest.  See Function Navigator for Current Functional Status.  Therapy/Group: Individual Therapy and Co-Treatment  Monia Pouch 12/12/2014, 9:44 AM

## 2014-12-12 NOTE — Progress Notes (Signed)
Came to assess pt and give evening neb treatment. Pt is off unit in dialysis at this time according to pt RN. Neb not given. RN aware

## 2014-12-12 NOTE — Progress Notes (Signed)
[name], female   DOB: [birth year], 59 y.o.   MRN: [id number]  12/12/14.  59 y/o admit for CIR with  functional deficits secondary to left MCA infarct/respiratory failure. Right hemiparesis improving, now using Y/N nods  Subjective/Complaints: Aphasic,  Y/N nods Had removal of HD cath    ROS cannot obtain due to global aphasia   Objective: Vital Signs: Blood pressure 100/77, pulse 105, temperature 98.1 F (36.7 C), temperature source Oral, resp. rate 20, weight 159 lb 13.3 oz (72.5 kg), SpO2 100 %. Dg Chest 2 View  12/12/2014   CLINICAL DATA:  Hypoxemia  EXAM: CHEST  2 VIEW  COMPARISON:  11/27/2014  FINDINGS: Chronic cardiomegaly. Stable mediastinal contours when accounting for leftward rotation.  Streaky opacities at the left base consistent with atelectasis. No visible pleural effusion. No edema, pneumonia, or air leak.  IMPRESSION: 1. Mild retrocardiac atelectasis. 2. Stable cardiomegaly without failure.   Electronically Signed   By: Monte Fantasia M.D.   On: 12/12/2014 08:39   Ir Removal Tun Cv Cath W/o Fl  12/10/2014   CLINICAL DATA:  Renal failure. The patient requires removal of a tunneled dialysis catheter. The catheter is no longer needed. No evidence of infection.  EXAM: REMOVAL OF TUNNELED CENTRAL VENOUS CATHETER  PROCEDURE: The right chest dialysis catheter site was prepped with chlorhexidine. A sterile gown and gloves were worn during the procedure. Local anesthesia was provided with 1% lidocaine.  Utilizing sharp and blunt dissection, the subcutaneous cuff of the dialysis catheter was freed. The catheter was then successfully removed in its entirety. A sterile dressing was applied over the catheter exit site.  IMPRESSION: Removal of tunneled dialysis catheter utilizing sharp and blunt dissection. The procedure was uncomplicated.   Electronically Signed   By: Jacqulynn Cadet M.D.   On: 12/10/2014 13:18   Results for orders placed or performed during the hospital  encounter of 11/25/14 (from the past 72 hour(s))  Glucose, capillary     Status: None   Collection Time: 12/09/14 11:39 AM  Result Value Ref Range   Glucose-Capillary 71 65 - 99 mg/dL   Comment 1 Notify RN   Renal function panel     Status: Abnormal   Collection Time: 12/09/14  5:45 PM  Result Value Ref Range   Sodium 129 (L) 135 - 145 mmol/L   Potassium 4.1 3.5 - 5.1 mmol/L   Chloride 86 (L) 101 - 111 mmol/L   CO2 28 22 - 32 mmol/L   Glucose, Bld 73 65 - 99 mg/dL   BUN 59 (H) 6 - 20 mg/dL   Creatinine, Ser 6.60 (H) 0.44 - 1.00 mg/dL   Calcium 9.4 8.9 - 10.3 mg/dL   Phosphorus 4.1 2.5 - 4.6 mg/dL   Albumin 2.5 (L) 3.5 - 5.0 g/dL   GFR calc non Af Amer 6 (L) >60 mL/min   GFR calc Af Amer 7 (L) >60 mL/min    Comment: (NOTE) The eGFR has been calculated using the CKD EPI equation. This calculation has not been validated in all clinical situations. eGFR's persistently <60 mL/min signify possible Chronic Kidney Disease.    Anion gap 15 5 - 15  CBC     Status: Abnormal   Collection Time: 12/09/14  5:45 PM  Result Value Ref Range   WBC 5.7 4.0 - 10.5 K/uL   RBC 3.56 (L) 3.87 - 5.11 MIL/uL   Hemoglobin 9.7 (L) 12.0 - 15.0 g/dL   HCT 30.5 (L) 36.0 - 46.0 %  MCV 85.7 78.0 - 100.0 fL   MCH 27.2 26.0 - 34.0 pg   MCHC 31.8 30.0 - 36.0 g/dL   RDW 18.0 (H) 11.5 - 15.5 %   Platelets 326 150 - 400 K/uL  Glucose, capillary     Status: None   Collection Time: 12/09/14  9:47 PM  Result Value Ref Range   Glucose-Capillary 97 65 - 99 mg/dL  Glucose, capillary     Status: Abnormal   Collection Time: 12/10/14 12:18 AM  Result Value Ref Range   Glucose-Capillary 110 (H) 65 - 99 mg/dL  Glucose, capillary     Status: Abnormal   Collection Time: 12/10/14  6:17 AM  Result Value Ref Range   Glucose-Capillary 108 (H) 65 - 99 mg/dL  Glucose, capillary     Status: None   Collection Time: 12/10/14 12:32 PM  Result Value Ref Range   Glucose-Capillary 66 65 - 99 mg/dL  Glucose, capillary      Status: None   Collection Time: 12/10/14  2:08 PM  Result Value Ref Range   Glucose-Capillary 68 65 - 99 mg/dL  Glucose, capillary     Status: None   Collection Time: 12/10/14  6:47 PM  Result Value Ref Range   Glucose-Capillary 93 65 - 99 mg/dL  Glucose, capillary     Status: Abnormal   Collection Time: 12/10/14 11:52 PM  Result Value Ref Range   Glucose-Capillary 101 (H) 65 - 99 mg/dL   Comment 1 Notify RN   Glucose, capillary     Status: Abnormal   Collection Time: 12/11/14  5:42 AM  Result Value Ref Range   Glucose-Capillary 110 (H) 65 - 99 mg/dL  Glucose, capillary     Status: None   Collection Time: 12/11/14 12:02 PM  Result Value Ref Range   Glucose-Capillary 72 65 - 99 mg/dL  Renal function panel     Status: Abnormal   Collection Time: 12/11/14  6:49 PM  Result Value Ref Range   Sodium 134 (L) 135 - 145 mmol/L   Potassium 3.7 3.5 - 5.1 mmol/L   Chloride 95 (L) 101 - 111 mmol/L   CO2 29 22 - 32 mmol/L   Glucose, Bld 104 (H) 65 - 99 mg/dL   BUN 35 (H) 6 - 20 mg/dL   Creatinine, Ser 4.35 (H) 0.44 - 1.00 mg/dL   Calcium 9.0 8.9 - 10.3 mg/dL   Phosphorus 2.0 (L) 2.5 - 4.6 mg/dL   Albumin 2.8 (L) 3.5 - 5.0 g/dL   GFR calc non Af Amer 10 (L) >60 mL/min   GFR calc Af Amer 12 (L) >60 mL/min    Comment: (NOTE) The eGFR has been calculated using the CKD EPI equation. This calculation has not been validated in all clinical situations. eGFR's persistently <60 mL/min signify possible Chronic Kidney Disease.    Anion gap 10 5 - 15  Glucose, capillary     Status: Abnormal   Collection Time: 12/12/14 12:30 AM  Result Value Ref Range   Glucose-Capillary 101 (H) 65 - 99 mg/dL  Glucose, capillary     Status: Abnormal   Collection Time: 12/12/14  5:48 AM  Result Value Ref Range   Glucose-Capillary 117 (H) 65 - 99 mg/dL     HEENT: poor oral movements  facial diplegia, cannot open mouth to command but opens spont Cardio: RRR and no murmur, no pain to palp of chest wall Resp:  CTA B/L.no use of accesory muscles, No wheezes and no upper airway sounds  N/C O2  GI: BS positive and NT, ND, prior miline abd incision with extensive scarring,  S/p PEG Extremity:  Pulses positive and No Edema Skin:   Other G tube site NT Neuro: Flat but very alert, aphasic, R HP  Musc/Skel:  Other decrease ROM in BIlateral knees and Right shoulder and elbow due to toneGen NAD    Medical Problem List and Plan: 1. Functional deficits secondary to left MCA infarct/respiratory failure. Right hemiparesis improving,  2. DVT Prophylaxis/Anticoagulation: SCDs. Monitor for any signs of DVT. 8/12 venous dopplers neg, mobility increasing so risk is as well  3. Dysphagia. Patient is nothing by mouth. Status post jejunostomy tube 08/12/2014 per interventional radiology for nutritional support. poor prognosis for improvement given severe bilateral deficits, do not anticipate d/c PEG during this admission,   4. Skin/Wound Care: Routine skin checks. J tube site maintenance. continue turning and nutrition, site looks good 5. Fluids/Electrolytes/Nutrition: Routine I&O. Adjust TF based on nutritional needs. Renal adjusting dialysis, complicated situation, Not a diabetic, getting CBGs because of TF, these have been normal, Dietary writing TF orders, hyponatremia managed by renal 6. End-stage renal disease with hemodialysis. Follow-up per renal services. Hemodialysis in PM after therapies to avoid conflict, Nepro for nutrition, hypoalbuminemia related to ESRD 7. Seizure prophylaxis. Keppra 500 mg twice a day, monitor drug toxicity. No seizures, continue current dose no evidence of excessive sedation  8. Chronic anemia. Aranesp weekly with dialysis, hgb monitored in HD, nephro managing  9.  Hx of recurrent resp failure, high risk for PNA given comorbid conditions,  RR increases at times. Comfortable. Secretions better. Afebrile. No distress.    -wean oxygen down to keep sats >90%  10.  Aphasia,  exp>Receptive, complicated by apraxia- Now starting with Y/N also with decreased initiation,  LOS (Days) 17 A FACE TO FACE EVALUATION WAS PERFORMED  Nyoka Cowden 12/12/2014, 8:58 AM

## 2014-12-13 ENCOUNTER — Inpatient Hospital Stay (HOSPITAL_COMMUNITY): Payer: Medicaid Other | Admitting: Physical Therapy

## 2014-12-13 LAB — GLUCOSE, CAPILLARY
GLUCOSE-CAPILLARY: 90 mg/dL (ref 65–99)
Glucose-Capillary: 113 mg/dL — ABNORMAL HIGH (ref 65–99)
Glucose-Capillary: 116 mg/dL — ABNORMAL HIGH (ref 65–99)
Glucose-Capillary: 79 mg/dL (ref 65–99)

## 2014-12-13 NOTE — Progress Notes (Signed)
Physical Therapy Session Note  Patient Details  Name: [name] MRN: [id number] Date of Birth: [birth year]  Today's Date: 12/13/2014 PT Individual Time: 1015-1100 PT Individual Time Calculation (min): 45 min   Short Term Goals: Week 1:  PT Short Term Goal 1 (Week 1): Will perform bed mobility with modA and use of bedrails PT Short Term Goal 1 - Progress (Week 1): Met PT Short Term Goal 2 (Week 1): Will perform supine <>sit maxA +1 and mod cues PT Short Term Goal 2 - Progress (Week 1): Met PT Short Term Goal 3 (Week 1): Will perform stand pivot transfer modA PT Short Term Goal 3 - Progress (Week 1): Met PT Short Term Goal 4 (Week 1): Will perform ambulation x25' with LRAD and maxA +1 PT Short Term Goal 4 - Progress (Week 1): Not met PT Short Term Goal 5 (Week 1): Will perform w/c propulsion x50' with modA PT Short Term Goal 5 - Progress (Week 1): Not met  Skilled Therapeutic Interventions/Progress Updates:  Pt was seen bedside in the am. Pt rolled R/L with side rails, S and increased time with verbal/visual cues. Pt transferred supine to edge of bed with bed rails and mod A. Pt transferred edge of bed to w/c with mod A and tactile cues. Pt transported to gym. Pt ambulated with rollator and min A for 70 feet x 2, second person to assist with O2 and tube feeding only. Pt unwilling to attempt any further treatment, actively resisting further attempts to stand. Pt returned to room. Pt left sitting up in tilt in space chair with chair tilted and quick release belt in place.    Therapy Documentation Precautions:  Precautions Precautions: Fall Precaution Comments: fall, NPO, pusher to the right at times, non-verbal,  Restrictions Weight Bearing Restrictions: No General:  Pain: No signs of pain.   See Function Navigator for Current Functional Status.   Therapy/Group: Individual Therapy  Dub Amis 12/13/2014, 12:28 PM

## 2014-12-13 NOTE — Progress Notes (Signed)
Patient ID: Juliane C Fiscus, female   DOB: [birth year], 59 y.o.   MRN: [id number]  Patient ID: Shermeka C Gabrielle, female   DOB: May 30, 1955, 59 y.o.   MRN: [id number]  12/13/14.  59 y/o admit for CIR with  functional deficits secondary to left MCA infarct/respiratory failure. Right hemiparesis improving, now using Y/N nods  Subjective/Complaints: Aphasic,  Y/N nods Had removal of HD cath   CXR 9/10 revealed mild atelectasis only   ROS cannot obtain due to global aphasia  Past Medical History  Diagnosis Date  . Asthma   . Hypertension   . Gout   . Arthritis   . Insomnia   . Chronic kidney disease   . Depression   . Renal insufficiency   . Aortic aneurysm without rupture     3cm by CT 08/03/14  . Paroxysmal SVT (supraventricular tachycardia)   . Cor pulmonale   . Stroke 06/2014  . PFO (patent foramen ovale)   . COPD (chronic obstructive pulmonary disease)       Objective: Vital Signs: Blood pressure 106/70, pulse 104, temperature 98.2 F (36.8 C), temperature source Oral, resp. rate 18, weight 151 lb 0.2 oz (68.5 kg), SpO2 99 %. Dg Chest 2 View  12/12/2014   CLINICAL DATA:  Hypoxemia  EXAM: CHEST  2 VIEW  COMPARISON:  11/27/2014  FINDINGS: Chronic cardiomegaly. Stable mediastinal contours when accounting for leftward rotation.  Streaky opacities at the left base consistent with atelectasis. No visible pleural effusion. No edema, pneumonia, or air leak.  IMPRESSION: 1. Mild retrocardiac atelectasis. 2. Stable cardiomegaly without failure.   Electronically Signed   By: Monte Fantasia M.D.   On: 12/12/2014 08:39   Results for orders placed or performed during the hospital encounter of 11/25/14 (from the past 72 hour(s))  Glucose, capillary     Status: None   Collection Time: 12/10/14 12:32 PM  Result Value Ref Range   Glucose-Capillary 66 65 - 99 mg/dL  Glucose, capillary     Status: None   Collection Time: 12/10/14  2:08 PM  Result Value Ref Range   Glucose-Capillary 68 65 - 99  mg/dL  Glucose, capillary     Status: None   Collection Time: 12/10/14  6:47 PM  Result Value Ref Range   Glucose-Capillary 93 65 - 99 mg/dL  Glucose, capillary     Status: Abnormal   Collection Time: 12/10/14 11:52 PM  Result Value Ref Range   Glucose-Capillary 101 (H) 65 - 99 mg/dL   Comment 1 Notify RN   Glucose, capillary     Status: Abnormal   Collection Time: 12/11/14  5:42 AM  Result Value Ref Range   Glucose-Capillary 110 (H) 65 - 99 mg/dL  Glucose, capillary     Status: None   Collection Time: 12/11/14 12:02 PM  Result Value Ref Range   Glucose-Capillary 72 65 - 99 mg/dL  Renal function panel     Status: Abnormal   Collection Time: 12/11/14  6:49 PM  Result Value Ref Range   Sodium 134 (L) 135 - 145 mmol/L   Potassium 3.7 3.5 - 5.1 mmol/L   Chloride 95 (L) 101 - 111 mmol/L   CO2 29 22 - 32 mmol/L   Glucose, Bld 104 (H) 65 - 99 mg/dL   BUN 35 (H) 6 - 20 mg/dL   Creatinine, Ser 4.35 (H) 0.44 - 1.00 mg/dL   Calcium 9.0 8.9 - 10.3 mg/dL   Phosphorus 2.0 (L) 2.5 - 4.6 mg/dL   Albumin  2.8 (L) 3.5 - 5.0 g/dL   GFR calc non Af Amer 10 (L) >60 mL/min   GFR calc Af Amer 12 (L) >60 mL/min    Comment: (NOTE) The eGFR has been calculated using the CKD EPI equation. This calculation has not been validated in all clinical situations. eGFR's persistently <60 mL/min signify possible Chronic Kidney Disease.    Anion gap 10 5 - 15  Glucose, capillary     Status: Abnormal   Collection Time: 12/12/14 12:30 AM  Result Value Ref Range   Glucose-Capillary 101 (H) 65 - 99 mg/dL  Glucose, capillary     Status: Abnormal   Collection Time: 12/12/14  5:48 AM  Result Value Ref Range   Glucose-Capillary 117 (H) 65 - 99 mg/dL  Glucose, capillary     Status: None   Collection Time: 12/12/14 11:54 AM  Result Value Ref Range   Glucose-Capillary 86 65 - 99 mg/dL  Glucose, capillary     Status: None   Collection Time: 12/12/14  6:50 PM  Result Value Ref Range   Glucose-Capillary 72 65 - 99  mg/dL  Glucose, capillary     Status: Abnormal   Collection Time: 12/13/14 12:03 AM  Result Value Ref Range   Glucose-Capillary 116 (H) 65 - 99 mg/dL   Comment 1 Notify RN   Glucose, capillary     Status: Abnormal   Collection Time: 12/13/14  7:13 AM  Result Value Ref Range   Glucose-Capillary 113 (H) 65 - 99 mg/dL   Comment 1 Notify RN      HEENT: poor oral movements  facial diplegia, cannot open mouth to command but opens spont Cardio: RRR and no murmur, no pain to palp of chest wall Resp: CTA B/L.no use of accesory muscles, No wheezes but coarse bilateral rhonchi;  N/C O2  GI: BS positive and NT, ND, prior miline abd incision with extensive scarring,  S/p PEG Extremity:  Pulses positive and No Edema Skin:   Other G tube site NT Neuro: Flat but very alert, aphasic, R HP  Musc/Skel:  Other decrease ROM in BIlateral knees and Right shoulder and elbow due to toneGen NAD    Medical Problem List and Plan: 1. Functional deficits secondary to left MCA infarct/respiratory failure. Right hemiparesis improving,  2. DVT Prophylaxis/Anticoagulation: SCDs. Monitor for any signs of DVT. 8/12 venous dopplers neg, mobility increasing so risk is as well  3. Dysphagia. Patient is nothing by mouth. Status post jejunostomy tube 08/12/2014 per interventional radiology for nutritional support. poor prognosis for improvement given severe bilateral deficits, do not anticipate d/c PEG during this admission,   4. Skin/Wound Care: Routine skin checks. J tube site maintenance. continue turning and nutrition, site looks good 5. Fluids/Electrolytes/Nutrition: Routine I&O. Adjust TF based on nutritional needs. Renal adjusting dialysis, complicated situation, Not a diabetic, getting CBGs because of TF, these have been normal, Dietary writing TF orders, hyponatremia managed by renal 6. End-stage renal disease with hemodialysis. Follow-up per renal services. Hemodialysis in PM after therapies to avoid conflict,  Nepro for nutrition, hypoalbuminemia related to ESRD 7. Seizure prophylaxis. Keppra 500 mg twice a day, monitor drug toxicity. No seizures, continue current dose no evidence of excessive sedation  8. Chronic anemia. Aranesp weekly with dialysis, hgb monitored in HD, nephro managing  9.  Hx of recurrent resp failure, high risk for PNA given comorbid conditions,  RR increases at times. Comfortable. Secretions better. Afebrile. No distress but increase rhonchi noted today; will continue Neb treatments and  check lab in am.  CXR atelectatic changes only on 9/10.    -wean oxygen down to keep sats >90%  10.  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

## 2014-12-13 NOTE — Progress Notes (Signed)
Subjective:   No complaints- nods head to questions asked  Objective Filed Vitals:   12/12/14 2230 12/12/14 2300 12/13/14 0610 12/13/14 0750  BP: 110/57 112/79 106/70   Pulse: 121 117 104   Temp:  97.9 F (36.6 C) 98.2 F (36.8 C)   TempSrc:  Oral Oral   Resp: 18 18 18    Weight:  68.5 kg (151 lb 0.2 oz)    SpO2:  96% 100% 99%   Physical Exam General: alert. Aphasic. No acute distress Heart: RRR Lungs: shallow, unlabored   Abdomen: soft, nontender G tube Extremities: no edema Dialysis Access:  L AVF +b/t  TTS [place] [facility] 4h 2/2 Bath 91kg Heparin none Hect 2 ug Aranesp 200 ug/wk + venofer 50/wk  Assessment/Plan: 1 CVA- with R hemiparesis and aphasia.- now in rehab PT/OT.  Wants to do everything she can to get better, but is DNR  2 ESRD TTS hd via AVF- next HD tuesday 3 Anemia - Hbg 9.7- aranesp q tuesday 4 HPTH - phos 2- hold- fosrenol/Ca+ 10 corrected- hectorol on hold  5 Vol down 20+kg from prior dry wt 6 BP - normotensive, no meds 7 Nutrition - NPO. Alb 2.8. nepro per gtube/ vitamin/ 8 Hx PAT - no documented hx of afib. Not on any medication for this now.   Jetty Duhamel, NP [facility] Beeper [phone] 12/13/2014,9:59 AM  LOS: 18 days   Pt seen, examined and agree w A/P as above.  [name] MD pager [phone]    cell [phone] 12/13/2014, 11:47 AM     Additional Objective Labs: Basic Metabolic Panel:  Recent Labs Lab 12/07/14 2034 12/09/14 1745 12/11/14 1849  NA 131* 129* 134*  K 3.9 4.1 3.7  CL 90* 86* 95*  CO2 29 28 29   GLUCOSE 92 73 104*  BUN 58* 59* 35*  CREATININE 6.25* 6.60* 4.35*  CALCIUM 8.8* 9.4 9.0  PHOS 3.8 4.1 2.0*   Liver Function Tests:  Recent Labs Lab 12/07/14 2034 12/09/14 1745 12/11/14 1849  ALBUMIN 2.5* 2.5* 2.8*   No results for input(s): LIPASE, AMYLASE in the last 168 hours. CBC:  Recent Labs Lab 12/07/14 2035 12/09/14 1745  WBC 2.9* 5.7  HGB 9.6* 9.7*  HCT 30.2* 30.5*  MCV 85.6  85.7  PLT 297 326   Blood Culture    Component Value Date/Time   SDES ABSCESS PELVIS 08/17/2014 1451   SPECREQUEST NONE 08/17/2014 1451   CULT  08/17/2014 1451    NO GROWTH 3 DAYS Performed at [facility]    REPTSTATUS 08/21/2014 FINAL 08/17/2014 1451    Cardiac Enzymes: No results for input(s): CKTOTAL, CKMB, CKMBINDEX, TROPONINI in the last 168 hours. CBG:  Recent Labs Lab 12/12/14 0548 12/12/14 1154 12/12/14 1850 12/13/14 0003 12/13/14 0713  GLUCAP 117* 86 72 116* 113*   Iron Studies: No results for input(s): IRON, TIBC, TRANSFERRIN, FERRITIN in the last 72 hours. @lablastinr3 @ Studies/Results: Dg Chest 2 View  12/12/2014   CLINICAL DATA:  Hypoxemia  EXAM: CHEST  2 VIEW  COMPARISON:  11/27/2014  FINDINGS: Chronic cardiomegaly. Stable mediastinal contours when accounting for leftward rotation.  Streaky opacities at the left base consistent with atelectasis. No visible pleural effusion. No edema, pneumonia, or air leak.  IMPRESSION: 1. Mild retrocardiac atelectasis. 2. Stable cardiomegaly without failure.   Electronically Signed   By: Marnee Spring M.D.   On: 12/12/2014 08:39   Medications: . feeding supplement (NEPRO CARB STEADY) 1,000 mL (12/13/14 0641)   . ALPRAZolam  0.5 mg Per Tube Q T,Th,Sa-HD  . antiseptic oral rinse  7 mL Mouth Rinse TID  . aspirin  325 mg Oral Daily  . budesonide  0.25 mg Nebulization BID  . chlorhexidine  15 mL Mouth/Throat BID  . [START ON 12/17/2014] darbepoetin (ARANESP) injection - DIALYSIS  100 mcg Intravenous Q Thu-HD  . free water  50 mL Per Tube TID  . guaifenesin  200 mg Per Tube TID  . ipratropium-albuterol  3 mL Nebulization TID  . lanolin   Topical BID  . lanthanum  500 mg Per Tube TID  . levETIRAcetam  500 mg Oral BID  . levothyroxine  25 mcg Oral QAC breakfast  . lipase/protease/amylase  2 capsule Oral Once   And  . sodium bicarbonate  650 mg Oral Once  . methylphenidate  10 mg Per Tube BID WC  . mirtazapine   7.5 mg Per Tube QHS  . multivitamin  1 tablet Oral QHS  . pantoprazole sodium  40 mg Per Tube Q1200

## 2014-12-14 ENCOUNTER — Inpatient Hospital Stay (HOSPITAL_COMMUNITY): Payer: Medicaid Other | Admitting: Physical Therapy

## 2014-12-14 ENCOUNTER — Inpatient Hospital Stay (HOSPITAL_COMMUNITY): Payer: Medicaid Other | Admitting: Occupational Therapy

## 2014-12-14 ENCOUNTER — Inpatient Hospital Stay (HOSPITAL_COMMUNITY): Payer: Medicaid Other | Admitting: Speech Pathology

## 2014-12-14 LAB — CBC
HCT: 33.1 % — ABNORMAL LOW (ref 36.0–46.0)
Hemoglobin: 10.4 g/dL — ABNORMAL LOW (ref 12.0–15.0)
MCH: 27.7 pg (ref 26.0–34.0)
MCHC: 31.4 g/dL (ref 30.0–36.0)
MCV: 88 fL (ref 78.0–100.0)
PLATELETS: 367 10*3/uL (ref 150–400)
RBC: 3.76 MIL/uL — AB (ref 3.87–5.11)
RDW: 19.2 % — ABNORMAL HIGH (ref 11.5–15.5)
WBC: 7.8 10*3/uL (ref 4.0–10.5)

## 2014-12-14 LAB — BASIC METABOLIC PANEL
Anion gap: 11 (ref 5–15)
BUN: 50 mg/dL — AB (ref 6–20)
CO2: 30 mmol/L (ref 22–32)
CREATININE: 5.54 mg/dL — AB (ref 0.44–1.00)
Calcium: 9.8 mg/dL (ref 8.9–10.3)
Chloride: 94 mmol/L — ABNORMAL LOW (ref 101–111)
GFR calc Af Amer: 9 mL/min — ABNORMAL LOW (ref >60)
GFR, EST NON AFRICAN AMERICAN: 8 mL/min — AB (ref >60)
Glucose, Bld: 122 mg/dL — ABNORMAL HIGH (ref 65–99)
POTASSIUM: 4.3 mmol/L (ref 3.5–5.1)
SODIUM: 135 mmol/L (ref 135–145)

## 2014-12-14 LAB — GLUCOSE, CAPILLARY
GLUCOSE-CAPILLARY: 101 mg/dL — AB (ref 65–99)
GLUCOSE-CAPILLARY: 86 mg/dL (ref 65–99)
GLUCOSE-CAPILLARY: 94 mg/dL (ref 65–99)
GLUCOSE-CAPILLARY: 99 mg/dL (ref 65–99)
Glucose-Capillary: 113 mg/dL — ABNORMAL HIGH (ref 65–99)

## 2014-12-14 NOTE — Progress Notes (Signed)
Subjective/Complaints: More alert today. Nodding yes and no more vigorously.  ROS cannot obtain due to global aphasia   Objective: Vital Signs: Blood pressure 109/71, pulse 102, temperature 98.4 F (36.9 C), temperature source Oral, resp. rate 17, weight 71.1 kg (156 lb 12 oz), SpO2 100 %. No results found. Results for orders placed or performed during the hospital encounter of 11/25/14 (from the past 72 hour(s))  Glucose, capillary     Status: None   Collection Time: 12/11/14 12:02 PM  Result Value Ref Range   Glucose-Capillary 72 65 - 99 mg/dL  Renal function panel     Status: Abnormal   Collection Time: 12/11/14  6:49 PM  Result Value Ref Range   Sodium 134 (L) 135 - 145 mmol/L   Potassium 3.7 3.5 - 5.1 mmol/L   Chloride 95 (L) 101 - 111 mmol/L   CO2 29 22 - 32 mmol/L   Glucose, Bld 104 (H) 65 - 99 mg/dL   BUN 35 (H) 6 - 20 mg/dL   Creatinine, Ser 1.53 (H) 0.44 - 1.00 mg/dL   Calcium 9.0 8.9 - 78.5 mg/dL   Phosphorus 2.0 (L) 2.5 - 4.6 mg/dL   Albumin 2.8 (L) 3.5 - 5.0 g/dL   GFR calc non Af Amer 10 (L) >60 mL/min   GFR calc Af Amer 12 (L) >60 mL/min    Comment: (NOTE) The eGFR has been calculated using the CKD EPI equation. This calculation has not been validated in all clinical situations. eGFR's persistently <60 mL/min signify possible Chronic Kidney Disease.    Anion gap 10 5 - 15  Glucose, capillary     Status: Abnormal   Collection Time: 12/12/14 12:30 AM  Result Value Ref Range   Glucose-Capillary 101 (H) 65 - 99 mg/dL  Glucose, capillary     Status: Abnormal   Collection Time: 12/12/14  5:48 AM  Result Value Ref Range   Glucose-Capillary 117 (H) 65 - 99 mg/dL  Glucose, capillary     Status: None   Collection Time: 12/12/14 11:54 AM  Result Value Ref Range   Glucose-Capillary 86 65 - 99 mg/dL  Glucose, capillary     Status: None   Collection Time: 12/12/14  6:50 PM  Result Value Ref Range   Glucose-Capillary 72 65 - 99 mg/dL  Glucose, capillary      Status: Abnormal   Collection Time: 12/13/14 12:03 AM  Result Value Ref Range   Glucose-Capillary 116 (H) 65 - 99 mg/dL   Comment 1 Notify RN   Glucose, capillary     Status: Abnormal   Collection Time: 12/13/14  7:13 AM  Result Value Ref Range   Glucose-Capillary 113 (H) 65 - 99 mg/dL   Comment 1 Notify RN   Glucose, capillary     Status: None   Collection Time: 12/13/14 11:53 AM  Result Value Ref Range   Glucose-Capillary 79 65 - 99 mg/dL   Comment 1 Notify RN   Glucose, capillary     Status: None   Collection Time: 12/13/14  6:52 PM  Result Value Ref Range   Glucose-Capillary 90 65 - 99 mg/dL   Comment 1 Notify RN   Glucose, capillary     Status: Abnormal   Collection Time: 12/14/14 12:05 AM  Result Value Ref Range   Glucose-Capillary 101 (H) 65 - 99 mg/dL   Comment 1 Notify RN   CBC     Status: Abnormal   Collection Time: 12/14/14  5:19 AM  Result Value  Ref Range   WBC 7.8 4.0 - 10.5 K/uL    Comment: REPEATED TO VERIFY   RBC 3.76 (L) 3.87 - 5.11 MIL/uL   Hemoglobin 10.4 (L) 12.0 - 15.0 g/dL    Comment: REPEATED TO VERIFY   HCT 33.1 (L) 36.0 - 46.0 %   MCV 88.0 78.0 - 100.0 fL   MCH 27.7 26.0 - 34.0 pg   MCHC 31.4 30.0 - 36.0 g/dL   RDW 19.2 (H) 11.5 - 15.5 %   Platelets 367 150 - 400 K/uL    Comment: PLATELET COUNT CONFIRMED BY SMEAR  Basic metabolic panel     Status: Abnormal   Collection Time: 12/14/14  5:19 AM  Result Value Ref Range   Sodium 135 135 - 145 mmol/L   Potassium 4.3 3.5 - 5.1 mmol/L   Chloride 94 (L) 101 - 111 mmol/L   CO2 30 22 - 32 mmol/L   Glucose, Bld 122 (H) 65 - 99 mg/dL   BUN 50 (H) 6 - 20 mg/dL   Creatinine, Ser 5.54 (H) 0.44 - 1.00 mg/dL   Calcium 9.8 8.9 - 10.3 mg/dL   GFR calc non Af Amer 8 (L) >60 mL/min   GFR calc Af Amer 9 (L) >60 mL/min    Comment: (NOTE) The eGFR has been calculated using the CKD EPI equation. This calculation has not been validated in all clinical situations. eGFR's persistently <60 mL/min signify possible  Chronic Kidney Disease.    Anion gap 11 5 - 15  Glucose, capillary     Status: Abnormal   Collection Time: 12/14/14  6:20 AM  Result Value Ref Range   Glucose-Capillary 113 (H) 65 - 99 mg/dL     HEENT: poor oral movements  facial diplegia, cannot open mouth to command but opens spont, Cardio: RRR and no murmur, no pain to palp of chest wall Resp: CTA B/L.no use of accessory muscles, No wheezes and no upper airway sounds, not able to cough on command GI: BS positive and NT, ND, prior midline abd incision with extensive scarring, scab with old sutures attached Extremity:  Pulses positive and No Edema Skin:   Other G tube site NT Neuro: Flat but very alert, Cranial Nerve Abnormalities bilateral central 7,Aphasic and Apraxic, nodding for Y/N questions, unable to test sensory due to aphasia but does withdraw to pinch Musc/Skel:  Other decrease ROM in BIlateral knees and Right shoulder and elbow due to tone, no pain with UE or LE ROM Gen NAD   Assessment/Plan: 1. 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 Physiatrist is providing close team supervision and 24 hour management of active medical problems listed below. Physiatrist and rehab team continue to assess barriers to discharge/monitor patient progress toward functional and medical goals.   FIM: Function - Bathing Position: Sitting EOB (perineal hygiene at bed level due to suspected incontinent BM) Body parts bathed by patient: Right arm, Chest, Right upper leg, Left upper leg Body parts bathed by helper: Back, Buttocks, Front perineal area, Left arm, Abdomen Bathing not applicable: Right lower leg, Left lower leg Assist Level: Touching or steadying assistance(Pt > 75%)  Function- Upper Body Dressing/Undressing What  is the patient wearing?: Pull over shirt/dress, Bra Bra - Perfomed by patient: Thread/unthread left bra strap Bra - Perfomed by helper: Thread/unthread right bra strap, Hook/unhook bra (  pull down sports bra) Pull over shirt/dress - Perfomed by patient: Thread/unthread left sleeve Pull over shirt/dress - Perfomed by helper: Thread/unthread right sleeve, Put head through opening, Pull shirt over trunk Assist Level: Touching or steadying assistance(Pt > 75%) Set up : To obtain clothing/put away Function - Lower Body Dressing/Undressing What is the patient wearing?: Non-skid slipper socks, Pants Position: Sitting EOB Pants- Performed by helper: Thread/unthread right pants leg, Thread/unthread left pants leg, Pull pants up/down Non-skid slipper socks- Performed by helper: Don/doff right sock, Don/doff left sock Assist Level: Touching or steadying assistance (Pt > 75%)  Function - Toileting Toileting activity did not occur: No continent bowel/bladder event Toileting steps completed by helper: Adjust clothing prior to toileting, Performs perineal hygiene, Adjust clothing after toileting Toileting Assistive Devices: Other (comment) (UE support on sink) Assist level: Two helpers  Function - Air cabin crew transfer activity did not occur: Safety/medical concerns Toilet transfer assistive device: Drop arm commode Assist level to toilet: 2 helpers Assist level from toilet: 2 helpers  Function - Chair/bed transfer Chair/bed transfer method: Stand pivot Chair/bed transfer assist level: Moderate assist (Pt 50 - 74%/lift or lower) Chair/bed transfer assistive device: Armrests Chair/bed transfer details: Manual facilitation for weight shifting, Verbal cues for sequencing, Tactile cues for sequencing  Function - Locomotion: Wheelchair Will patient use wheelchair at discharge?: Yes Wheelchair activity did not occur:  (Attempted hand-over-hand facilitation for w/c propulsion, visual  demonstration, however no pt initiation evident) Assist Level: Dependent (Pt equals 0%) Function - Locomotion: Ambulation Ambulation activity did not occur: Safety/medical concerns (Attempted ambulation, however upon standing x3 reps pt unable to step (unable to determine if due to$Remove' \\apraxia'MviatXZ$  or pt fearful)) Assistive device: Other (comment) (rollator) Max distance: 70 Assist level: Touching or steadying assistance (Pt > 75%) Walk 10 feet activity did not occur: Safety/medical concerns Assist level: Touching or steadying assistance (Pt > 75%) Walk 50 feet with 2 turns activity did not occur: Safety/medical concerns Assist level: Touching or steadying assistance (Pt > 75%) Walk 150 feet activity did not occur: Safety/medical concerns Assist level: 2 helpers Walk 10 feet on uneven surfaces activity did not occur: Safety/medical concerns  Function - Comprehension Comprehension: Auditory Comprehension assist level: Understands basic 25 - 49% of the time/ requires cueing 50 - 75% of the time  Function - Expression Expression: Nonverbal Expression assistive device: Other (Comment) Expression assist level: Expresses basis less than 25% of the time/requires cueing >75% of the time.  Function - Social Interaction Social Interaction assist level: Interacts appropriately 25 - 49% of time - Needs frequent redirection.  Function - Problem Solving Problem solving assist level: Solves basic less than 25% of the time - needs direction nearly all the time or does not effectively solve problems and may need a restraint for safety  Function - Memory Memory assist level: Recognizes or recalls 25 - 49% of the time/requires cueing 50 - 75% of the time Patient normally able to recall (first 3 days only): None of the above  Medical Problem List and Plan: 1. Functional deficits secondary to left MCA infarct/respiratory failure. Right hemiparesis improving, now using Y/N nods, Alertness increasing with  ritalin 2. DVT Prophylaxis/Anticoagulation: SCDs. Monitor for any signs of DVT. 8/12 venous dopplers neg, mobility increasing so risk is as well 3. Pain Management: Tylenol as needed, no apparent pain during PT 4. Dysphagia. Patient is nothing by mouth. Status post jejunostomy tube 08/12/2014 per interventional radiology for nutritional support. poor prognosis for improvement given severe bilateral deficits, do  not anticipate d/c J tube during this admission,  5. Neuropsych: This patient is not capable of making decisions on her own behalf. 6. Skin/Wound Care: Routine skin checks. J tube site maintenance. continue turning and nutrition, site looks good 7. Fluids/Electrolytes/Nutrition: Routine I&O. Adjust TF based on nutritional needs. Renal adjusting dialysis, complicated situation, Not a diabetic, getting CBGs because of TF, these have been normal, Dietary writing TF orders, now on nocturnal tube feeds only 8. End-stage renal disease with hemodialysis. Follow-up per renal services. Hemodialysis in PM after therapies to avoid conflict, Nepro for nutrition, hypoalbuminemia related to ESRD 9. Seizure prophylaxis. Keppra 500 mg twice a day, monitor drug toxicity. No seizures, Ritalin can decrease seizure threshold however no evidence of this thus far 10. Hypothyroidism. Synthroid 11. Chronic anemia. Aranesp weekly with dialysis, hgb monitored in HD, nephro managing 12. History of asthma. Schedule nebulizers given aphasia. , no wheezing today, no volitional cough, secretions improving 13. Mood/anxiety. Continue Xanax on dialysis days 14.  Hx of recurrent resp failure, high risk for PNA given comorbid conditions,  RR increases at times. Comfortable. Secretions better. Afebrile. No distress.    -wean oxygen down to keep sats >90% 15.  Bowel incont- monitor skin 16.  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

## 2014-12-14 NOTE — Progress Notes (Signed)
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 Alb 2.8. nepro per g-tube 8 Hx PAT - no documented hx of afib. Not on any medication.   Subjective: Interval History: aphasic  Objective: Vital signs in last 24 hours: Temp:  [98.4 F (36.9 C)] 98.4 F (36.9 C) (09/12 9147) Pulse Rate:  [96-102] 102 (09/12 0633) Resp:  [17] 17 (09/12 0633) BP: (105-109)/(71-79) 109/71 mmHg (09/12 0633) SpO2:  [98 %-100 %] 100 % (09/12 0633) Weight:  [71.1 kg (156 lb 12 oz)] 71.1 kg (156 lb 12 oz) (09/12 0500) Weight change: 2.4 kg (5 lb 4.7 oz)  Intake/Output from previous day:   Intake/Output this shift:    General appearance: cooperative Resp: clear to auscultation bilaterally Chest wall: no tenderness Cardio: regular rate and rhythm, S1, S2 normal, no murmur, click, rub or gallop Neurologic: follows commands incorrectly, expressive aphasia, frustrated  Lab Results:  Recent Labs  12/14/14 0519  WBC 7.8  HGB 10.4*  HCT 33.1*  PLT 367   BMET:  Recent Labs  12/11/14 1849 12/14/14 0519  NA 134* 135  K 3.7 4.3  CL 95* 94*  CO2 29 30  GLUCOSE 104* 122*  BUN 35* 50*  CREATININE 4.35* 5.54*  CALCIUM 9.0 9.8   No results for input(s): PTH in the last 72 hours. Iron Studies: No results for input(s): IRON, TIBC, TRANSFERRIN, FERRITIN in the last 72 hours. Studies/Results: No results found.  Scheduled: . ALPRAZolam  0.5 mg Per Tube Q T,Th,Sa-HD  . antiseptic oral rinse  7 mL Mouth Rinse TID  . aspirin  325 mg Oral Daily  . budesonide  0.25 mg Nebulization BID  . chlorhexidine  15 mL Mouth/Throat BID  . [START ON 12/17/2014] darbepoetin (ARANESP) injection - DIALYSIS  100 mcg Intravenous Q Thu-HD  . free water  50 mL Per Tube TID  .  guaifenesin  200 mg Per Tube TID  . ipratropium-albuterol  3 mL Nebulization TID  . lanolin   Topical BID  . levETIRAcetam  500 mg Oral BID  . levothyroxine  25 mcg Oral QAC breakfast  . lipase/protease/amylase  2 capsule Oral Once   And  . sodium bicarbonate  650 mg Oral Once  . methylphenidate  10 mg Per Tube BID WC  . mirtazapine  7.5 mg Per Tube QHS  . multivitamin  1 tablet Oral QHS  . pantoprazole sodium  40 mg Per Tube Q1200     LOS: 19 days   Schlotzhauer,Jacyln Carmer C 12/14/2014,2:48 PM

## 2014-12-14 NOTE — Progress Notes (Signed)
Speech Language Pathology Daily Session Note  Patient Details  Name: [name] MRN: [id number] Date of Birth: [birth year]  Today's Date: 12/14/2014 SLP Individual Time: 1307-1400 SLP Individual Time Calculation (min): 53 min  Short Term Goals: Week 3: SLP Short Term Goal 1 (Week 3): Pt will improve management of her secretions as evidenced by ability to orally expectorate secretions and anteriorly contain her saliva with max assist multimodal cues.   SLP Short Term Goal 2 (Week 3): Pt will consume PO trials with max assist multimodal cues to clear boluses from the oral cavity and to initiate swallow in a timely manner over 3 consecutive sessions prior to completion of objective swallow study.  SLP Short Term Goal 3 (Week 3): Pt will initiate a basic,familiar task with max assist verbal cues over 75% of observable opportunities.  SLP Short Term Goal 4 (Week 3): Pt will visually scan to midline in >25% of observable opportunities during basic, functional tasks with mod verbal cues.  SLP Short Term Goal 5 (Week 3): Pt will vocalize to produce vowels in isolation with max assist multimodal cues.    Skilled Therapeutic Interventions:  Pt was seen for skilled ST targeting cognitive-linguistic goals.  Upon arrival, pt was seated upright in wheelchair, awake, alert, and affect brightened considerably in the presence of SLP.  Pt gestured animatedly towards the chest of drawers in her room and nodded "yes" when asked if she needed something.  After ~20 minutes and max gestural cues, SLP was able to identify that pt wanted her hairnet via yes/no responses.  Pt was able to don hairnet independently.    Pt was able to match phrase to object from a field of three for 50% accuracy with mod-max assist verbal cues for error awareness.  Pt also was able to produce multiple volitional coughs upon command and returned demonstration of diaphragmatic breathing techniques with manipulatives with min assist verbal and  visual cues.  Pt self -initated vocalization ~5 times throughout session but remains unable to produce specific phonemes.  Pt was returned to room, left upright in wheelchair with quick release belt donned and call bell left within reach.  Continue per current plan of care.    Function:  Eating Eating                 Cognition Comprehension Comprehension assist level: Understands basic 25 - 49% of the time/ requires cueing 50 - 75% of the time  Expression   Expression assist level: Expresses basis less than 25% of the time/requires cueing >75% of the time.  Social Interaction Social Interaction assist level: Interacts appropriately 25 - 49% of time - Needs frequent redirection.  Problem Solving Problem solving assist level: Solves basic less than 25% of the time - needs direction nearly all the time or does not effectively solve problems and may need a restraint for safety  Memory Memory assist level: Recognizes or recalls 25 - 49% of the time/requires cueing 50 - 75% of the time    Pain Pain Assessment Pain Assessment: No/denies pain Pain Score: 0-No pain Faces Pain Scale: No hurt  Therapy/Group: Individual Therapy  Kenyatta Keidel, Melanee Spry 12/14/2014, 4:51 PM

## 2014-12-14 NOTE — Progress Notes (Signed)
Physical Therapy Session Note  Patient Details  Name: [name] MRN: [id number] Date of Birth: July 06, 1955  Today's Date: 12/14/2014 PT Individual Time: 1100-1150 PT Individual Time Calculation (min): 50 min   Short Term Goals: Week 3:  PT Short Term Goal 1 (Week 3): Pt will perform bed mobility supervision and min cues PT Short Term Goal 2 (Week 3): Pt will perform stand pivot transfer consistent minA with mod cues for sequencing PT Short Term Goal 3 (Week 3): Pt will perform gait with LRAD x75' with supervision overall PT Short Term Goal 4 (Week 3): Pt will perform 8 3-inch stairs with modA PT Short Term Goal 5 (Week 3): Pt will perform standing balance x5 min with BUE support and supervision  Skilled Therapeutic Interventions/Progress Updates:    Pt received seated on edge of mat table following previous OT session; no c/o pain and agreeable to treatment. Gait training for 2 trials of 30' with RW and CGA overall, occasional tactile cues for increasing RLE step length with pt demonstrating step-to pattern when cues removed. Pt given goal of walking to opposite end of gym and back, however pt requests to sit after 30' and reports with head shaking that she is tired when given yes/no questions. Sit <>stand from elevated mat table with minA/CGA. While standing with occasional UE support on RW, performed catching/throwing with beach ball. Requires seated rest break after 2-3 min of standing activity, and requires several minutes to recover for next attempt. Pt begins to demonstrate labored breathing and facial expressions suggestive of pain or discomfort; vitals assessed O2 98%, HR 123/92 and HR 102. Therapist had difficulty identifying cause of pt distress; discussed with RN who states pt has not received breathing treatment and will contact RT to come. Pt returned to room, attempted use of suction with no success due to pt unable to produce secretions from cough. Pt left seated in w/c on 3L O2,  100% O2 sat, an BP 122/87, all needs within reach. RN alerted to pt status and pt request for breathing treatment.   Therapy Documentation Precautions:  Precautions Precautions: Fall Precaution Comments: fall, NPO, pusher to the right at times, non-verbal,  Restrictions Weight Bearing Restrictions: No Pain: Pain Assessment Pain Assessment: No/denies pain Pain Score: 0-No pain Faces Pain Scale: No hurt   See Function Navigator for Current Functional Status.   Therapy/Group: Individual Therapy  [name] 12/14/2014, 3:33 PM

## 2014-12-14 NOTE — Progress Notes (Signed)
Occupational Therapy Session Note  Patient Details  Name: [name] MRN: [id number] Date of Birth: May 31, 1955  Today's Date: 12/14/2014 OT Individual Time: 0930-1100 OT Individual Time Calculation (min): 90 min    Short Term Goals: Week 3:  OT Short Term Goal 1 (Week 3): Pt will perform UB bathing with mod assist sitting supported. OT Short Term Goal 2 (Week 3): Pt will complete UB dressing with mod assist  OT Short Term Goal 3 (Week 3): Pt will complete LB dressing with max assist at sit > stand level OT Short Term Goal 4 (Week 3): Pt will perform toilet transfer stand pivot with no more than mod assist.   Skilled Therapeutic Interventions/Progress Updates:    ADL retraining with focus on initiation, problem solving, and increased participation in self-care tasks.  Pt appeared distressed upon arrival, however all vitals WNL and RN reports pt just appearing anxious this AM.  Completed bed mobility with min cues for initiation and pt demonstrating improved initiation with mobility and self-care tasks.  Pt with improved sequencing with UB bathing and dressing with setup assist and cue to initiate task.  Min tactile cues for forward weight shift with sit > stand and intermittent min-mod for sit > stand throughout session.    Utilized Dynavision with focus on initiation and scanning to Rt visual field. Mode A with pt reaction time 2.61 and 2.45 seconds, speed impaired by pt attempting to utilize RUE and with decreased scanning to Rt. Mode B set to 3 second limitation with pt correctly selecting 60/70 with reaction time of 1.48 seconds. Reaction time 1.32 and 1.37 in Lt upper and lower quadrant and 1.53 and 1.75 on Rt with cues to only utilize Lt hand.  Mode B set to 5 second limitation to allow use of Rt hand only with pt correctly selecting 12/16 with reaction time of 3.20 seconds.  Pt with overall improvements with RUE use and initiation, as evidenced by improvements as noted on Dynavision as  well as functionally with self-care tasks.  Therapy Documentation Precautions:  Precautions Precautions: Fall Precaution Comments: fall, NPO, pusher to the right at times, non-verbal,  Restrictions Weight Bearing Restrictions: No Pain:  Pt with no c/o pain  See Function Navigator for Current Functional Status.   Therapy/Group: Individual Therapy  Rosalio Loud 12/14/2014, 12:00 PM

## 2014-12-15 ENCOUNTER — Inpatient Hospital Stay (HOSPITAL_COMMUNITY): Payer: Medicaid Other | Admitting: Occupational Therapy

## 2014-12-15 ENCOUNTER — Inpatient Hospital Stay (HOSPITAL_COMMUNITY): Payer: Medicaid Other | Admitting: Speech Pathology

## 2014-12-15 ENCOUNTER — Inpatient Hospital Stay (HOSPITAL_COMMUNITY): Payer: Medicaid Other | Admitting: Physical Therapy

## 2014-12-15 LAB — CBC WITH DIFFERENTIAL/PLATELET
Basophils Absolute: 0 10*3/uL (ref 0.0–0.1)
Basophils Relative: 0 % (ref 0–1)
Eosinophils Absolute: 0.9 10*3/uL — ABNORMAL HIGH (ref 0.0–0.7)
Eosinophils Relative: 9 % — ABNORMAL HIGH (ref 0–5)
HCT: 30.5 % — ABNORMAL LOW (ref 36.0–46.0)
Hemoglobin: 9.4 g/dL — ABNORMAL LOW (ref 12.0–15.0)
Lymphocytes Relative: 21 % (ref 12–46)
Lymphs Abs: 2 10*3/uL (ref 0.7–4.0)
MCH: 26.9 pg (ref 26.0–34.0)
MCHC: 30.8 g/dL (ref 30.0–36.0)
MCV: 87.1 fL (ref 78.0–100.0)
Monocytes Absolute: 0.8 10*3/uL (ref 0.1–1.0)
Monocytes Relative: 8 % (ref 3–12)
Neutro Abs: 5.8 10*3/uL (ref 1.7–7.7)
Neutrophils Relative %: 62 % (ref 43–77)
Platelets: 401 10*3/uL — ABNORMAL HIGH (ref 150–400)
RBC: 3.5 MIL/uL — ABNORMAL LOW (ref 3.87–5.11)
RDW: 19.2 % — ABNORMAL HIGH (ref 11.5–15.5)
WBC: 9.5 10*3/uL (ref 4.0–10.5)

## 2014-12-15 LAB — RENAL FUNCTION PANEL
Albumin: 2.8 g/dL — ABNORMAL LOW (ref 3.5–5.0)
Anion gap: 14 (ref 5–15)
BUN: 82 mg/dL — ABNORMAL HIGH (ref 6–20)
CO2: 29 mmol/L (ref 22–32)
Calcium: 9.7 mg/dL (ref 8.9–10.3)
Chloride: 90 mmol/L — ABNORMAL LOW (ref 101–111)
Creatinine, Ser: 7.6 mg/dL — ABNORMAL HIGH (ref 0.44–1.00)
GFR calc Af Amer: 6 mL/min — ABNORMAL LOW (ref >60)
GFR calc non Af Amer: 5 mL/min — ABNORMAL LOW (ref >60)
Glucose, Bld: 100 mg/dL — ABNORMAL HIGH (ref 65–99)
Phosphorus: 3.3 mg/dL (ref 2.5–4.6)
Potassium: 4.2 mmol/L (ref 3.5–5.1)
Sodium: 133 mmol/L — ABNORMAL LOW (ref 135–145)

## 2014-12-15 LAB — GLUCOSE, CAPILLARY
GLUCOSE-CAPILLARY: 78 mg/dL (ref 65–99)
Glucose-Capillary: 118 mg/dL — ABNORMAL HIGH (ref 65–99)

## 2014-12-15 LAB — IRON AND TIBC
Iron: 34 ug/dL (ref 28–170)
Saturation Ratios: 11 % (ref 10.4–31.8)
TIBC: 315 ug/dL (ref 250–450)
UIBC: 281 ug/dL

## 2014-12-15 LAB — FERRITIN: Ferritin: 679 ng/mL — ABNORMAL HIGH (ref 11–307)

## 2014-12-15 NOTE — Progress Notes (Signed)
Subjective/Complaints: More alert today. Able to follow commands and cough   ROS cannot obtain due to global aphasia   Objective: Vital Signs: Blood pressure 128/82, pulse 95, temperature 98.1 F (36.7 C), temperature source Oral, resp. rate 17, weight 70.9 kg (156 lb 4.9 oz), SpO2 100 %. No results found. Results for orders placed or performed during the hospital encounter of 11/25/14 (from the past 72 hour(s))  Glucose, capillary     Status: None   Collection Time: 12/12/14 11:54 AM  Result Value Ref Range   Glucose-Capillary 86 65 - 99 mg/dL  Glucose, capillary     Status: None   Collection Time: 12/12/14  6:50 PM  Result Value Ref Range   Glucose-Capillary 72 65 - 99 mg/dL  Glucose, capillary     Status: Abnormal   Collection Time: 12/13/14 12:03 AM  Result Value Ref Range   Glucose-Capillary 116 (H) 65 - 99 mg/dL   Comment 1 Notify RN   Glucose, capillary     Status: Abnormal   Collection Time: 12/13/14  7:13 AM  Result Value Ref Range   Glucose-Capillary 113 (H) 65 - 99 mg/dL   Comment 1 Notify RN   Glucose, capillary     Status: None   Collection Time: 12/13/14 11:53 AM  Result Value Ref Range   Glucose-Capillary 79 65 - 99 mg/dL   Comment 1 Notify RN   Glucose, capillary     Status: None   Collection Time: 12/13/14  6:52 PM  Result Value Ref Range   Glucose-Capillary 90 65 - 99 mg/dL   Comment 1 Notify RN   Glucose, capillary     Status: Abnormal   Collection Time: 12/14/14 12:05 AM  Result Value Ref Range   Glucose-Capillary 101 (H) 65 - 99 mg/dL   Comment 1 Notify RN   CBC     Status: Abnormal   Collection Time: 12/14/14  5:19 AM  Result Value Ref Range   WBC 7.8 4.0 - 10.5 K/uL    Comment: REPEATED TO VERIFY   RBC 3.76 (L) 3.87 - 5.11 MIL/uL   Hemoglobin 10.4 (L) 12.0 - 15.0 g/dL    Comment: REPEATED TO VERIFY   HCT 33.1 (L) 36.0 - 46.0 %   MCV 88.0 78.0 - 100.0 fL   MCH 27.7 26.0 - 34.0 pg   MCHC 31.4 30.0 - 36.0 g/dL   RDW 19.2 (H) 11.5 - 15.5  %   Platelets 367 150 - 400 K/uL    Comment: PLATELET COUNT CONFIRMED BY SMEAR  Basic metabolic panel     Status: Abnormal   Collection Time: 12/14/14  5:19 AM  Result Value Ref Range   Sodium 135 135 - 145 mmol/L   Potassium 4.3 3.5 - 5.1 mmol/L   Chloride 94 (L) 101 - 111 mmol/L   CO2 30 22 - 32 mmol/L   Glucose, Bld 122 (H) 65 - 99 mg/dL   BUN 50 (H) 6 - 20 mg/dL   Creatinine, Ser 5.54 (H) 0.44 - 1.00 mg/dL   Calcium 9.8 8.9 - 10.3 mg/dL   GFR calc non Af Amer 8 (L) >60 mL/min   GFR calc Af Amer 9 (L) >60 mL/min    Comment: (NOTE) The eGFR has been calculated using the CKD EPI equation. This calculation has not been validated in all clinical situations. eGFR's persistently <60 mL/min signify possible Chronic Kidney Disease.    Anion gap 11 5 - 15  Glucose, capillary  Status: Abnormal   Collection Time: 12/14/14  6:20 AM  Result Value Ref Range   Glucose-Capillary 113 (H) 65 - 99 mg/dL  Glucose, capillary     Status: None   Collection Time: 12/14/14 12:03 PM  Result Value Ref Range   Glucose-Capillary 86 65 - 99 mg/dL  Glucose, capillary     Status: None   Collection Time: 12/14/14  6:43 PM  Result Value Ref Range   Glucose-Capillary 99 65 - 99 mg/dL   Comment 1 Notify RN   Glucose, capillary     Status: None   Collection Time: 12/14/14 11:48 PM  Result Value Ref Range   Glucose-Capillary 94 65 - 99 mg/dL  Glucose, capillary     Status: Abnormal   Collection Time: 12/15/14  5:54 AM  Result Value Ref Range   Glucose-Capillary 118 (H) 65 - 99 mg/dL     HEENT: poor oral movements  facial diplegia, cannot open mouth to command but opens spont, Cardio: RRR and no murmur, no pain to palp of chest wall Resp: CTA B/L.no use of accessory muscles, No wheezes and no upper airway sounds, not able to cough on command GI: BS positive and NT, ND, prior midline abd incision with extensive scarring, scab with old sutures attached Extremity:  Pulses positive and No Edema Skin:    Other G tube site NT Neuro: Flat but very alert, Cranial Nerve Abnormalities bilateral central 7,Aphasic and Apraxic, nodding for Y/N questions, unable to test sensory due to aphasia but does withdraw to pinch Musc/Skel:  Other decrease ROM in BIlateral knees and Right shoulder and elbow due to tone, no pain with UE or LE ROM Gen NAD   Assessment/Plan: 1. 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 Physiatrist is providing close team supervision and 24 hour management of active medical problems listed below. Physiatrist and rehab team continue to assess barriers to discharge/monitor patient progress toward functional and medical goals.  May start Chest PT now that pt can communicate better and do volitional cough  FIM: Function - Bathing Position: Wheelchair/chair at sink Body parts bathed by patient: Right arm, Chest, Right upper leg, Left upper leg, Left arm, Abdomen Body parts bathed by helper: Front perineal area, Buttocks, Back Bathing not applicable: Right lower leg, Left lower leg Assist Level: Touching or steadying assistance(Pt > 75%)  Function- Upper Body Dressing/Undressing What is the patient wearing?: Pull over shirt/dress, Bra Bra - Perfomed by patient: Thread/unthread left bra strap Bra - Perfomed by helper: Thread/unthread right bra strap, Hook/unhook bra (pull down sports bra) Pull over shirt/dress - Perfomed by patient: Thread/unthread left sleeve, Put head through opening, Pull shirt over trunk Pull over shirt/dress - Perfomed by helper: Thread/unthread right sleeve Assist Level: Touching or steadying assistance(Pt > 75%) Set up : To obtain clothing/put away Function - Lower Body Dressing/Undressing What is the patient wearing?: Non-skid slipper socks,  Pants Position: Wheelchair/chair at sink Pants- Performed by helper: Thread/unthread right pants leg, Thread/unthread left pants leg, Pull pants up/down Non-skid slipper socks- Performed by helper: Don/doff right sock, Don/doff left sock Assist Level: Touching or steadying assistance (Pt > 75%)  Function - Toileting Toileting activity did not occur: No continent bowel/bladder event Toileting steps completed by helper: Adjust clothing prior to toileting, Performs perineal hygiene, Adjust clothing after toileting Toileting Assistive Devices:  Other (comment) (UE support on sink) Assist level: Two helpers  Function - Air cabin crew transfer activity did not occur: Safety/medical concerns Toilet transfer assistive device: Drop arm commode Assist level to toilet: 2 helpers Assist level from toilet: 2 helpers  Function - Chair/bed transfer Chair/bed transfer method: Stand pivot Chair/bed transfer assist level: Moderate assist (Pt 50 - 74%/lift or lower) Chair/bed transfer assistive device: Armrests, Walker Chair/bed transfer details: Manual facilitation for weight shifting, Verbal cues for sequencing, Tactile cues for sequencing  Function - Locomotion: Wheelchair Will patient use wheelchair at discharge?: Yes Wheelchair activity did not occur:  (Attempted hand-over-hand facilitation for w/c propulsion, visual demonstration, however no pt initiation evident) Assist Level: Dependent (Pt equals 0%) Function - Locomotion: Ambulation Ambulation activity did not occur: Safety/medical concerns (Attempted ambulation, however upon standing x3 reps pt unable to step (unable to determine if due to$Remove' \\apraxia'[id number]$  or pt fearful)) Assistive device: Walker-rolling Max distance: 30 Assist level: Touching or steadying assistance (Pt > 75%) Walk 10 feet activity did not occur: Safety/medical concerns Assist level: Touching or steadying assistance (Pt > 75%) Walk 50 feet with 2 turns activity did not  occur: Safety/medical concerns Assist level: Touching or steadying assistance (Pt > 75%) Walk 150 feet activity did not occur: Safety/medical concerns Assist level: 2 helpers Walk 10 feet on uneven surfaces activity did not occur: Safety/medical concerns  Function - Comprehension Comprehension: Auditory Comprehension assist level: Understands basic 25 - 49% of the time/ requires cueing 50 - 75% of the time  Function - Expression Expression: Nonverbal Expression assistive device: Other (Comment) Expression assist level: Expresses basis less than 25% of the time/requires cueing >75% of the time.  Function - Social Interaction Social Interaction assist level: Interacts appropriately 25 - 49% of time - Needs frequent redirection.  Function - Problem Solving Problem solving assist level: Solves basic less than 25% of the time - needs direction nearly all the time or does not effectively solve problems and may need a restraint for safety  Function - Memory Memory assist level: Recognizes or recalls 25 - 49% of the time/requires cueing 50 - 75% of the time Patient normally able to recall (first 3 days only): None of the above  Medical Problem List and Plan: 1. Functional deficits secondary to left MCA infarct/respiratory failure. Right hemiparesis improving, now using Y/N nods, Alertness increasing with ritalin 2. DVT Prophylaxis/Anticoagulation: SCDs. Monitor for any signs of DVT. 8/12 venous dopplers neg, mobility increasing so risk is as well 3. Pain Management: Tylenol as needed, no apparent pain during PT 4. Dysphagia. Patient is nothing by mouth. Status post jejunostomy tube 08/12/2014 per interventional radiology for nutritional support. poor prognosis for improvement given severe bilateral deficits, do not anticipate d/c J tube during this admission,  5. Neuropsych: This patient is not capable of making decisions on her own behalf. 6. Skin/Wound Care: Routine skin checks. J tube  site maintenance. continue turning and nutrition, site looks good 7. Fluids/Electrolytes/Nutrition: Routine I&O. Adjust TF based on nutritional needs. Renal adjusting dialysis, complicated situation, Not a diabetic, getting CBGs because of TF, these have been normal, Dietary writing TF orders, now on nocturnal tube feeds only 8. End-stage renal disease with hemodialysis. Follow-up per renal services. Hemodialysis in PM after therapies to avoid conflict, Nepro for nutrition, hypoalbuminemia related to ESRD 9. Seizure prophylaxis. Keppra 500 mg twice a day, monitor drug toxicity. No seizures, Ritalin can decrease seizure threshold however no evidence of this thus far 10. Hypothyroidism. Synthroid 11. Chronic anemia.  Aranesp weekly with dialysis, hgb monitored in HD, nephro managing 12. History of asthma. Schedule nebulizers given aphasia. , no wheezing today, no volitional cough, secretions improving 13. Mood/anxiety. Continue Xanax on dialysis days 14.  Hx of recurrent resp failure, high risk for PNA given comorbid conditions,  RR increases at times. Comfortable. Secretions better. Afebrile. No distress.    -wean oxygen down to keep sats >90% 15.  Bowel incont- monitor skin 16.  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

## 2014-12-15 NOTE — Progress Notes (Signed)
Speech Language Pathology Daily Session Note  Patient Details  Name: [name] MRN: [id number] Date of Birth: 09-19-1955  Today's Date: 12/15/2014 SLP Individual Time: 1303-1405 SLP Individual Time Calculation (min): 62 min  Short Term Goals: Week 3: SLP Short Term Goal 1 (Week 3): Pt will improve management of her secretions as evidenced by ability to orally expectorate secretions and anteriorly contain her saliva with max assist multimodal cues.   SLP Short Term Goal 2 (Week 3): Pt will consume PO trials with max assist multimodal cues to clear boluses from the oral cavity and to initiate swallow in a timely manner over 3 consecutive sessions prior to completion of objective swallow study.  SLP Short Term Goal 3 (Week 3): Pt will initiate a basic,familiar task with max assist verbal cues over 75% of observable opportunities.  SLP Short Term Goal 4 (Week 3): Pt will visually scan to midline in >25% of observable opportunities during basic, functional tasks with mod verbal cues.  SLP Short Term Goal 5 (Week 3): Pt will vocalize to produce vowels in isolation with max assist multimodal cues.    Skilled Therapeutic Interventions:  Pt was seen for skilled ST targeting cognitive-linguistic goals, goals for dysphagia, and ongoing family education.  Upon arrival, pt was partially reclined in bed, awake, alert, and indicated that she wanted to get out of bed via head nod with min assist verbal cues.  Pt was transferred to wheelchair with assist from RN.  Pt's daughter, [name], arrived shortly after beginning of session and presented with questions regarding use of white board to facilitate pt's functional communication.  SLP informed daughter that written expression was likely to be very difficult for pt given her motor planning deficits and that stroke affected her dominant side.  Instead, SLP encouraged pt's daughter to use yes/no questions with visual aids and emphasizing key words to determine pt's  needs and wants.  Pt's daughter was provided with a handout of communication strategies to maximize pt's functional independence for conveying her needs and wants.  Pt was able to vocalize on command in ~75% of opportunities with mod-max assist verbal cues and was briefly stimulable for lip rounding to produce the vowel /oo/.  Pt was also able to cough on command in ~75% of opportunities with min-mod verbal cues.  Cough was nonproductive.  SLP facilitated the session with trials of PO to continue working towards initiation of PO diet.  Pt consumed ~7 teaspoons of purees and was noted with what appeared to be improved timeliness of swallow initiation.  Pt also demonstrated no overt s/s of aspiration with honey thick liquids over ~5 teaspoons or 3 cup sips.  Recommend an MBS tomorrow to objectively determine readiness for initiation of PO diet.  Continue per current plan of care.    Function:  Eating Eating     Eating Assist Level: Help with picking up utensils;Help managing cup/glass;Set up assist for;Supervision or verbal cues (with trials )   Eating Set Up Assist For: Opening containers (with trials ) Helper Scoops Food on Utensil: Occasionally (with trials ) Helper Brings Food to Mouth: Occasionally (with trials )   Cognition Comprehension Comprehension assist level: Understands basic 25 - 49% of the time/ requires cueing 50 - 75% of the time  Expression   Expression assist level: Expresses basic 25 - 49% of the time/requires cueing 50 - 75% of the time. Uses single words/gestures.  Social Interaction Social Interaction assist level: Interacts appropriately 25 - 49% of time -  Needs frequent redirection.  Problem Solving Problem solving assist level: Solves basic 25 - 49% of the time - needs direction more than half the time to initiate, plan or complete simple activities  Memory Memory assist level: Recognizes or recalls 25 - 49% of the time/requires cueing 50 - 75% of the time    Pain Pain  Assessment Pain Assessment: No/denies pain  Therapy/Group: Individual Therapy  Jim Philemon, Melanee Spry 12/15/2014, 3:51 PM

## 2014-12-15 NOTE — Progress Notes (Signed)
[facility] Progress Note  Assessment/Plan: 1. 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 ESRD -TTS on HD labs pending 3. Anemia - Hgb 9.4  - Aranesp today - iron studies ordered for Saturday were not drawn - asked staff to draw today 4. Secondary hyperparathyroidism - Ca levels have been  >10 corrected - change to 2 Ca bath - labs today pending 5. HTN/volume -  BP 90s UF Friday was 1.8 500 on Sat goal today 2.5 - [pst weight 68.5 Saturday 6. Nutrition - alb 2.8 7. Coughing/wheezes - try suction - if not relief will get breathing treatment  [name], PA-C [facility] Beeper [phone] 12/15/2014,5:05 PM  LOS: 20 days    Renal Attending: Agree with note as articulated above. Ken,Carolyne Whitsel C   Subjective:   aphasic  Objective Filed Vitals:   12/15/14 0450 12/15/14 0850 12/15/14 1410 12/15/14 1509  BP: 128/82  117/80   Pulse: 95   80  Temp: 98.1 F (36.7 C)  98.4 F (36.9 C)   TempSrc: Oral  Oral   Resp: 17  18   Weight: 70.9 kg (156 lb 4.9 oz)     SpO2: 95% 100% 100%    Physical Exam on HD General: coughing up copious white phlegm Heart: RRR Lungs: coarse BS throughout/wheezes Abdomen: soft NT with GT Extremities: no edema Dialysis Access: left AVF   Dialysis Orders:  TTS [place] [facility] 4h 2/2 Bath 91kg Heparin none Hect 2 ug Aranesp 200 ug/wk + venofer 50/wk  Additional Objective Labs: Basic Metabolic Panel:  Recent Labs Lab 12/09/14 1745 12/11/14 1849 12/14/14 0519  NA 129* 134* 135  K 4.1 3.7 4.3  CL 86* 95* 94*  CO2 GLUCOSE 73 104* 122*  BUN 59* 35* 50*  CREATININE 6.60* 4.35* 5.54*  CALCIUM 9.4 9.0 9.8  PHOS 4.1 2.0*  --    Liver Function Tests:  Recent Labs Lab 12/09/14 1745 12/11/14 1849  ALBUMIN 2.5* 2.8*   CBC:  Recent Labs Lab 12/09/14 1745 12/14/14 0519  WBC 5.7 7.8  HGB 9.7* 10.4*  HCT 30.5* 33.1*  MCV 85.7 88.0  PLT 326 367   Blood Culture    Component Value  Date/Time   SDES ABSCESS PELVIS 08/17/2014 1451   SPECREQUEST NONE 08/17/2014 1451   CULT  08/17/2014 1451    NO GROWTH 3 DAYS Performed at [facility]    REPTSTATUS 08/21/2014 FINAL 08/17/2014 1451    Cardiac Enzymes: No results for input(s): CKTOTAL, CKMB, CKMBINDEX, TROPONINI in the last 168 hours. CBG:  Recent Labs Lab 12/14/14 1203 12/14/14 1843 12/14/14 2348 12/15/14 0554 12/15/14 1143  GLUCAP 86 99 94 118* 78   Iron Studies: No results for input(s): IRON, TIBC, TRANSFERRIN, FERRITIN in the last 72 hours. @ Studies/Results: No results found. Medications: . feeding supplement (NEPRO CARB STEADY) 1,000 mL (12/15/14 1205)   . ALPRAZolam  0.5 mg Per Tube Q T,Th,Sa-HD  . antiseptic oral rinse  7 mL Mouth Rinse TID  . aspirin  325 mg Oral Daily  . budesonide  0.25 mg Nebulization BID  . chlorhexidine  15 mL Mouth/Throat BID  . [START ON 12/17/2014] darbepoetin (ARANESP) injection - DIALYSIS  100 mcg Intravenous Q Thu-HD  . free water  50 mL Per Tube TID  . guaifenesin  200 mg Per Tube TID  . ipratropium-albuterol  3 mL Nebulization TID  . lanolin   Topical BID  . levETIRAcetam  500 mg Oral  BID  . levothyroxine  25 mcg Oral QAC breakfast  . lipase/protease/amylase  2 capsule Oral Once   And  . sodium bicarbonate  650 mg Oral Once  . methylphenidate  10 mg Per Tube BID WC  . mirtazapine  7.5 mg Per Tube QHS  . multivitamin  1 tablet Oral QHS  . pantoprazole sodium  40 mg Per Tube Q1200

## 2014-12-15 NOTE — Progress Notes (Signed)
Physical Therapy Session Note  Patient Details  Name: [name] MRN: [id number] Date of Birth: Oct 03, 1955  Today's Date: 12/15/2014 PT Individual Time: 1000-1100 PT Individual Time Calculation (min): 60 min   Short Term Goals: Week 3:  PT Short Term Goal 1 (Week 3): Pt will perform bed mobility supervision and min cues PT Short Term Goal 2 (Week 3): Pt will perform stand pivot transfer consistent minA with mod cues for sequencing PT Short Term Goal 3 (Week 3): Pt will perform gait with LRAD x75' with supervision overall PT Short Term Goal 4 (Week 3): Pt will perform 8 3-inch stairs with modA PT Short Term Goal 5 (Week 3): Pt will perform standing balance x5 min with BUE support and supervision  Skilled Therapeutic Interventions/Progress Updates:    Pt received seated in w/c; no c/o pain and agreeable to treatment. Gait training with rollator and CGA overall x100'; noted improving RLE step length however still < LLE, shuffling gait pattern with decreased foot clearance. Cognitive task at [employer] board with pt cued to use letters to spell name, daughters name. Initial attempt at spelling pt name required mod cues; spelling daughters name performed with min cues. Pt asked if there was something she wanted to tell therapist that she could spell and pt shakes head yes, however after long period of time with no initiation to reach for letter despite looking at letters as if trying to motor plan/initiate, cues for picking between 2-3 letters at a time, pt becomes frustrated and shakes head yes when asked if she wanted to do a different activity. Performed sit <>stand in therapy gym to transfer to mat table with CGA, however once pt is standing with supervision pt has incontinent bowel movement and nods head yes when asked if she needed to return to the room to change. Sit <>stand at sink and standing x5 min while therapist performs doffing of pants/brief, donning new brief with maxA.  Supervision balance while standing, UE support on sink. Pt able to use LUE to assist with donning pants, however continues to require modA for R side management. Pt remained seated in w/c with QR belt intact and all needs within reach at completion of session.   Therapy Documentation Precautions:  Precautions Precautions: Fall Precaution Comments: fall, NPO, pusher to the right at times, non-verbal,  Restrictions Weight Bearing Restrictions: No  Pain: Pain Assessment Pain Assessment: No/denies pain Pain Score: 0-No pain PAINAD (Pain Assessment in Advanced Dementia) Breathing: normal Negative Vocalization: none Facial Expression: smiling or inexpressive Body Language: tense, distressed pacing, fidgeting Consolability: no need to console PAINAD Score: 1   See Function Navigator for Current Functional Status.   Therapy/Group: Individual Therapy  [name] 12/15/2014, 12:29 PM

## 2014-12-15 NOTE — Progress Notes (Signed)
Speech Language Pathology Daily Session Note  Patient Details  Name: [name] MRN: [id number] Date of Birth: 18-Jul-1955  Today's Date: 12/15/2014 SLP Individual Time: 1102-1130 SLP Individual Time Calculation (min): 28 min  Short Term Goals: Week 3: SLP Short Term Goal 1 (Week 3): Pt will improve management of her secretions as evidenced by ability to orally expectorate secretions and anteriorly contain her saliva with max assist multimodal cues.   SLP Short Term Goal 2 (Week 3): Pt will consume PO trials with max assist multimodal cues to clear boluses from the oral cavity and to initiate swallow in a timely manner over 3 consecutive sessions prior to completion of objective swallow study.  SLP Short Term Goal 3 (Week 3): Pt will initiate a basic,familiar task with max assist verbal cues over 75% of observable opportunities.  SLP Short Term Goal 4 (Week 3): Pt will visually scan to midline in >25% of observable opportunities during basic, functional tasks with mod verbal cues.  SLP Short Term Goal 5 (Week 3): Pt will vocalize to produce vowels in isolation with max assist multimodal cues.    Skilled Therapeutic Interventions: Skilled treatment session focused on addressing communication goals. SLP facilitated session by providing opportunities and Max assist multimodal cues for vocalizations, which patient was able to do in ~25% of opportunities.  SLP also facilitated session with Min tactile cues for initiation of gestures to guide SLP while route find to and from therapy session.  Patient was able to communicate via yes/no head nods and gestures with hands that she did not want to go back to her room and wanted to sit at the RN station.  Short term goal for multimodal communcation added to care plan due to increased initiation.      Function:   Cognition Comprehension Comprehension assist level: Understands basic 25 - 49% of the time/ requires cueing 50 - 75% of the time  Expression    Expression assist level: Expresses basic 25 - 49% of the time/requires cueing 50 - 75% of the time. Uses single words/gestures.  Social Interaction Social Interaction assist level: Interacts appropriately 25 - 49% of time - Needs frequent redirection.  Problem Solving Problem solving assist level: Solves basic 25 - 49% of the time - needs direction more than half the time to initiate, plan or complete simple activities  Memory Memory assist level: Recognizes or recalls 25 - 49% of the time/requires cueing 50 - 75% of the time    Pain Pain Assessment Pain Assessment: No/denies pain  Therapy/Group: Individual Therapy  Charlane Ferretti., CCC-SLP [phone]  [name] 12/15/2014, 4:47 PM

## 2014-12-15 NOTE — Progress Notes (Signed)
Occupational Therapy Session Note  Patient Details  Name: [name] MRN: [id number] Date of Birth: 1955-05-02  Today's Date: 12/15/2014 OT Individual Time: 1191-4782 OT Individual Time Calculation (min): 45 min    Short Term Goals: Week 3:  OT Short Term Goal 1 (Week 3): Pt will perform UB bathing with mod assist sitting supported. OT Short Term Goal 2 (Week 3): Pt will complete UB dressing with mod assist  OT Short Term Goal 3 (Week 3): Pt will complete LB dressing with max assist at sit > stand level OT Short Term Goal 4 (Week 3): Pt will perform toilet transfer stand pivot with no more than mod assist.   Skilled Therapeutic Interventions/Progress Updates:    ADL retraining with focus on initiation, problem solving, and increased participation in self-care tasks.  Pt vigorously shaking head yes when asked if ready to get OOB.  Pt completed bed mobility with supervision and min/steady assist to come to sitting at EOB with placement of hand on bed rails to assist.  Manual facilitation for forward weight shift for sit > stand and transfer.  Pt continues to require hand over hand assist to complete tasks thoroughly, noting increased initiation with task but unable to complete.    Therapy Documentation Precautions:  Precautions Precautions: Fall Precaution Comments: fall, NPO, pusher to the right at times, non-verbal,  Restrictions Weight Bearing Restrictions: No General:   Vital Signs: Oxygen Therapy SpO2: 100 % O2 Device: Nasal Cannula O2 Flow Rate (L/min): 2 L/min Pain:  Pt with no c/o pain  See Function Navigator for Current Functional Status.   Therapy/Group: Individual Therapy  Rosalio Loud 12/15/2014, 10:43 AM

## 2014-12-16 ENCOUNTER — Inpatient Hospital Stay (HOSPITAL_COMMUNITY): Payer: Medicaid Other | Admitting: Speech Pathology

## 2014-12-16 ENCOUNTER — Inpatient Hospital Stay (HOSPITAL_COMMUNITY): Payer: Medicaid Other | Admitting: Physical Therapy

## 2014-12-16 ENCOUNTER — Inpatient Hospital Stay (HOSPITAL_COMMUNITY): Payer: Medicaid Other | Admitting: Occupational Therapy

## 2014-12-16 ENCOUNTER — Inpatient Hospital Stay (HOSPITAL_COMMUNITY): Payer: Medicaid Other

## 2014-12-16 ENCOUNTER — Encounter (HOSPITAL_COMMUNITY): Payer: Medicaid Other | Admitting: Speech Pathology

## 2014-12-16 LAB — GLUCOSE, CAPILLARY
GLUCOSE-CAPILLARY: 126 mg/dL — AB (ref 65–99)
GLUCOSE-CAPILLARY: 88 mg/dL (ref 65–99)
Glucose-Capillary: 127 mg/dL — ABNORMAL HIGH (ref 65–99)
Glucose-Capillary: 67 mg/dL (ref 65–99)
Glucose-Capillary: 77 mg/dL (ref 65–99)

## 2014-12-16 MED ORDER — SODIUM CHLORIDE 0.9 % IV SOLN
125.0000 mg | INTRAVENOUS | Status: DC
  Administered 2014-12-17 – 2014-12-22 (×3): 125 mg via INTRAVENOUS
  Filled 2014-12-16 (×7): qty 10

## 2014-12-16 NOTE — Patient Care Conference (Signed)
Inpatient RehabilitationTeam Conference and Plan of Care Update Date: 12/16/2014   Time: 11;20 AM    Patient Name: [name]      Medical Record Number: [id number]  Date of Birth: Sep 16, 1955 Sex: Female         Room/Bed: 4W22C/4W22C-01 Payor Info: Payor: MEDICAID [place] / Plan: MEDICAID [place] ACCESS / Product Type: *No Product type* /    Admitting Diagnosis: L MCA infarct  old cva  Admit Date/Time:  11/25/2014  4:48 PM Admission Comments: No comment available   Primary Diagnosis:  Left middle cerebral artery stroke Principal Problem: Left middle cerebral artery stroke  Patient Active Problem List   Diagnosis Date Noted  . Left middle cerebral artery stroke 11/25/2014  . Right hemiparesis 11/25/2014  . Aphasia due to stroke 11/25/2014  . Acute respiratory failure   . Cerebral infarction due to embolism of left middle cerebral artery   . Gastrostomy tube in place   . PEG (percutaneous endoscopic gastrostomy) status   . Respiratory distress   . DNAR (do not attempt resuscitation) 11/20/2014  . Paroxysmal a-fib 11/16/2014  . HLD (hyperlipidemia) 11/16/2014  . ESRD on dialysis 11/16/2014  . History of stroke 11/16/2014  . Essential hypertension   . PFO (patent foramen ovale)   . Acute respiratory failure with hypoxemia   . CVA (cerebral infarction) 11/10/2014  . Chronic ischemic colitis 09/25/2014  . Acute right ACA stroke 09/11/2014  . Left hemiparesis 09/11/2014  . History of ETT   . Duodenal fistula   . Sepsis   . Aortic aneurysm 08/13/2014  . Protein-calorie malnutrition   . Itching 08/12/2014  . Duodenal anastomotic leak   . Protein calorie malnutrition   . SVT (supraventricular tachycardia)   . Right heart failure   . ESRD (end stage renal disease)   . Abdominal pain   . Palliative care encounter   . 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 Fistula   . History of intermediate V/Q scan   . Other emphysema   . Intra-abdominal abscess   . Abnormal  TSH   . Enteritis due to Clostridium difficile 07/19/2014  . Abdominal abscess   . Pelvic fluid collection   . Perforated gastric ulcer 07/01/2014  . SOB (shortness of breath)   . PJRT (permanent junctional reciprocating tachycardia)   . Paroxysmal atrial tachycardia   . PSVT (paroxysmal supraventricular tachycardia)   . Stroke   . Hyperlipidemia   . 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 Hypertension 06/20/2014  . ESRD needing dialysis 06/20/2014  . Obesity (BMI 30-39.9) 06/20/2014  . Depression 06/20/2014  . Left renal mass 06/20/2014  . Chronic diastolic heart failure 06/20/2014  . H1N1 influenza 06/20/2014  . Tobacco use disorder 06/20/2014  . ARF (acute renal failure)   . Renal failure (ARF), acute on chronic   . Acute respiratory failure with hypoxia 06/17/2014  . End stage renal disease 01/21/2014    Expected Discharge Date: Expected Discharge Date: 12/24/14  Team Members Present: Physician leading conference: Dr. Claudette Laws Social Worker Present: Dossie Der, LCSW Nurse Present: Carmie End, RN PT Present: Alyson Reedy, PT OT Present: Rosalio Loud, OT SLP Present: Jackalyn Lombard, SLP PPS Coordinator present : Edson Snowball, PT     Current Status/Progress Goal Weekly Team Focus  Medical   aphasia but now with Y/N nods  basic needs comunicated nods or gestures  Trial po feeds   Bowel/Bladder   Incontinent of bowel and bladder. LBM 12/15/14  manage bowel and  bladder max assist  attempt timed toileting regimen    Swallow/Nutrition/ Hydration   Pending MBS today per improvements noted at bedside   min assist   pending MBS   ADL's   Mod-min assist sit > stand and stand pivot transfers, min-mod assist UB dressing, continues to require max-total for bathing and LB dressing due to decreased initiation and ability to follow tasks through to completion  mod assist bathing, LB dressing, toileting, min assist grooming, UB dressing, toilet  transfers  initiation, completion of tasks, BUE NMR, sit > stand, pt and family education   Mobility   minA bed mobility, transfers, CGA/minA gait with rollator x100', minA stairs  minA bed mobility, transfers, gait, w/c; modA stairs  transfers, standing balance/endurance, gait training, LE strengthening   Communication   able to vocalize briefly and intermittently, no specific phonemes appreciated yet, improved initiation of multimodal communication, continued improvements noted in yes/no accuracy   min-max assist   vocalization, multimodal communication, following commands during functional tasks.    Safety/Cognition/ Behavioral Observations  improved basic, functional problem solving resulting from improved initiation and sustained attention to tasks  min-mod for basic   continue to address functional problem solving, initiation, attention to tasks.     Pain   Non Verbal. Pt nods to "pain" when asked but unable to specify a location. 650 mg of tyelenol prn q 4 hrs  pain managed with faces of 2 or less  assess pain and watch for nonverbal pain cues.    Skin   MASD to peri area. Barrier cream applied. Midline incision to abdomen with sutures. Lanolin ointment applied.  To keep skin free of new skin breakdown and infections mod assist  assess skin q shift and prn. Continue barrier cream to bottom and lanolin cream to abdominal incision.       *See Care Plan and progress notes for long and short-term goals.  Barriers to Discharge: still with sig communcation and swallowing issues    Possible Resolutions to Barriers:  Cont rehab, see above    Discharge Planning/Teaching Needs:  Daughter has been here for family education, pt making good progress in therapies, son to come in also      Team Discussion:  MBS today-started diet-Dys 1-honey. Starting to vocalize and iniaiting more-brighter affect, more aware of deficits. Making good progress in therapies reason for extension of stay.   Revisions to Treatment Plan:  Extended stay due to progress being made-goals upgraded and diet started-Dys 1 honey thick    Continued Need for Acute Rehabilitation Level of Care: The patient requires daily medical management by a physician with specialized training in physical medicine and rehabilitation for the following conditions: Daily direction of a multidisciplinary physical rehabilitation program to ensure safe treatment while eliciting the highest outcome that is of practical value to the patient.: Yes Daily medical management of patient stability for increased activity during participation in an intensive rehabilitation regime.: Yes Daily analysis of laboratory values and/or radiology reports with any subsequent need for medication adjustment of medical intervention for : Neurological problems;Other  Lucy Chris 12/17/2014, 10:33 AM

## 2014-12-16 NOTE — Progress Notes (Signed)
Nutrition Follow-up  DOCUMENTATION CODES:   Not applicable  INTERVENTION:   Encourage PO intake. (Diet just advanced with po intake 25%)  Continue nocturnal feeds of Nepro formula via J-tube at new goal rate of 85 ml/hr x 14 hours (6pm-8am) to provide 2142 kcal (100% of needs), 96 grams of protein, and 867 ml of free water.   Continue free water flushes 50 ml TID.  RD to adjust tube feeds when po intake improves.  NUTRITION DIAGNOSIS:   Inadequate oral intake related to inability to eat as evidenced by NPO status; advanced to diet; po intake 25%; ongoing  GOAL:   Patient will meet greater than or equal to 90% of their needs; met  MONITOR:   PO intake, TF tolerance, Weight trends, Labs, I & O's  REASON FOR ASSESSMENT:   Consult Enteral/tube feeding initiation and management  ASSESSMENT:   59 y.o. 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  Presented 11/10/2014 with right-sided weakness and left gaze deviation. MRI of the brain showed moderate size left MCA territory infarct affecting the left insula and operculum.  Pt passed swallow evaluation today. Diet has been advanced to a dysphagia 1 diet with honey thick liquids. PO intake at lunch was 25% with speech therapist. For now, RD to continue with current nocturnal tube feed orders as suspected suboptimal nutrient intake. RD to adjust/modify tube feeding orders once po intake improves. RD to continue to monitor.  Labs and medications reviewed.  Diet Order:  DIET - DYS 1 Room service appropriate?: Yes; Fluid consistency:: Honey Thick  Skin:   (Incision on R chest, generalized edema)  Last BM:  9/13  Height:   Ht Readings from Last 1 Encounters:  11/11/14 5' 7.01" (1.702 m)    Weight:   Wt  Readings from Last 1 Encounters:  12/16/14 154 lb 15.7 oz (70.3 kg)    Ideal Body Weight:  61 kg  BMI:  Body mass index is 24.27 kg/(m^2).  Estimated Nutritional Needs:   Kcal:  2000-2200  Protein:  95-115 grams  Fluid:  Per MD  EDUCATION NEEDS:   No education needs identified at this time  Corrin Parker, MS, RD, LDN Pager # [phone] After hours/ weekend pager # [phone]

## 2014-12-16 NOTE — Progress Notes (Signed)
Speech Language Pathology Weekly Progress and Session Note  Patient Details  Name: [name] MRN: [id number] Date of Birth: Aug 16, 1955  Beginning of progress report period: December 09, 2014 End of progress report period: December 16, 2014  Today's Date: 12/16/2014 SLP Individual Time: 1230-1300 SLP Individual Time Calculation (min): 30 min  Short Term Goals: Week 3: SLP Short Term Goal 1 (Week 3): Pt will improve management of her secretions as evidenced by ability to orally expectorate secretions and anteriorly contain her saliva with max assist multimodal cues.   SLP Short Term Goal 1 - Progress (Week 3): Progressing toward goal SLP Short Term Goal 2 (Week 3): Pt will consume PO trials with max assist multimodal cues to clear boluses from the oral cavity and to initiate swallow in a timely manner over 3 consecutive sessions prior to completion of objective swallow study.  SLP Short Term Goal 2 - Progress (Week 3): Met SLP Short Term Goal 3 (Week 3): Pt will initiate a basic,familiar task with max assist verbal cues over 75% of observable opportunities.  SLP Short Term Goal 3 - Progress (Week 3): Met SLP Short Term Goal 4 (Week 3): Pt will visually scan to midline in >25% of observable opportunities during basic, functional tasks with mod verbal cues.  SLP Short Term Goal 4 - Progress (Week 3): Met SLP Short Term Goal 5 (Week 3): Pt will vocalize to produce vowels in isolation with max assist multimodal cues.   SLP Short Term Goal 5 - Progress (Week 3): Progressing toward goal SLP Short Term Goal 6 (Week 3): Pt will communicate basic wants and needs via multimodal communication with Mod assist multimodal cues SLP Short Term Goal 6 - Progress (Week 3): Progressing toward goal    New Short Term Goals: Week 4: SLP Short Term Goal 1 (Week 4): Pt will improve management of her secretions as evidenced by ability to orally expectorate secretions and anteriorly contain her saliva with max  assist multimodal cues.   SLP Short Term Goal 2 (Week 4): Pt will consume dys 1 textures and honey thick liquids with mod multimodal cues for use of swallowing precautions and minimal overt s/s  of aspiration.   SLP Short Term Goal 3 (Week 4): Pt will initiate a basic,familiar task with mod assist verbal cues over 75% of observable opportunities.  SLP Short Term Goal 4 (Week 4): Pt will visually scan to midline in >75% of observable opportunities during basic, functional tasks with mod verbal cues.  SLP Short Term Goal 5 (Week 4): Pt will vocalize to produce vowels in isolation with max assist multimodal cues.   SLP Short Term Goal 6 (Week 4): Pt will communicate basic wants and needs via multimodal communication with Mod assist multimodal cues  Weekly Progress Updates:  Pt made functional gains this reporting period and has met 3 out of 6 short term goals.  Pt was initiated on a dys 1, honey thick liquids diet today  with full staff supervision for use of swallowing precautions.   Pt has demonstrated improvements in her ability to vocalize but is unable to consistently produce specific phonemes.  Pt's yes/no accuracy also continues to improve for answering immediate/personally relevant questions and she has demonstrated improved initiation of functional communication via gestures, pointing and facial expressions.  Pt continues to present with significant cognitive deficits and will need 24/7 supervision at discharge in addition to [place] follow up.  Pt and family education is ongoing.    Intensity: Minumum of  1-2 x/day, 30 to 90 minutes Frequency: 3 to 5 out of 7 days Duration/Length of Stay: 21-28 days  Treatment/Interventions: Cognitive remediation/compensation;Cueing hierarchy;Functional tasks;Multimodal communication approach;Patient/family education;Internal/external aids;Speech/Language facilitation;Dysphagia/aspiration precaution training;Environmental controls   Daily Session  Skilled  Therapeutic Interventions: Pt was seen for skilled ST targeting dysphagia goals.  Upon arrival, pt was seated upright in wheelchair, awake, alert, and smiled when asked if she wanted to eat lunch.  Pt consumed dys 1 textures and honey thick liquids and demonstrated initial immediate coughing episode following several consecutive bites of purees.  Mod-max assist verbal and visual cues for consumption of small amounts of purees consumed at a slow rate were tolerated well with no overt s/s of aspiration.  No overt s/s of aspiration were evident with 2 oz of honey thick cranberry juice.  Suspect coughing episode was likely due to build up of pharyngeal residue or purees from base of tongue coating which spilled over into the airway.  Pt was also able to initiate a second swallow volitionally with max verbal cues which appeared to further improve rate control and minimized overt s/s of aspiration.  Recommend that pt remain on dys 1, honey thick liquids diet with full STAFF supervision.  Pt left upright in wheelchair with call bell left within reach.  Goals updated on this date to reflect current progress and plan of care.     Function:   Eating Eating  Modified Consistency Diet: Yes Eating Assist Level: Help with picking up utensils;Help managing cup/glass;Set up assist for;Supervision or verbal cues   Eating Set Up Assist For: Opening containers Helper Scoops Food on Utensil: Occasionally     Cognition Comprehension Comprehension assist level: Understands basic 25 - 49% of the time/ requires cueing 50 - 75% of the time  Expression   Expression assist level: Expresses basic 25 - 49% of the time/requires cueing 50 - 75% of the time. Uses single words/gestures.  Social Interaction Social Interaction assist level: Interacts appropriately 25 - 49% of time - Needs frequent redirection.  Problem Solving Problem solving assist level: Solves basic 25 - 49% of the time - needs direction more than half the time  to initiate, plan or complete simple activities  Memory Memory assist level: Recognizes or recalls 25 - 49% of the time/requires cueing 50 - 75% of the time   General    Pain Pain Assessment Pain Assessment: No/denies pain Pain Score: 0-No pain  Therapy/Group: Individual Therapy  Tionne Dayhoff, Selinda Orion 12/16/2014, 3:31 PM

## 2014-12-16 NOTE — Progress Notes (Signed)
Hypoglycemic Event  CBG: 67  Treatment: 15 GM carbohydrate snack  Symptoms: None  Follow-up CBG: Time:1701 CBG Result:77  Possible Reasons for Event: Inadequate meal intake  Comments/MD notified: PA Delle Reining notified. Pt hooked to tube feed to prevent bs drop again.     [name]  Remember to initiate Hypoglycemia Order Set & complete

## 2014-12-16 NOTE — Progress Notes (Signed)
Assessment/Plan: 1. 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 ESRD -TTS on HD labs pending 3. Anemia - Hgb 9.4 - yesterday, low iron.  Will supplement on HD 4. Secondary hyperparathyroidism - Ca levels have been >10 corrected - change to 2 Ca bath - labs today pending 5. HTN/volume - low BPs 6. Nutrition - alb 2.8 7. Coughing/wheezes -improved  Subjective: Interval History: Dialysis yesterday with -1500  Objective: Vital signs in last 24 hours: Temp:  [97.6 F (36.4 C)-98.5 F (36.9 C)] 97.6 F (36.4 C) (09/14 1610) Pulse Rate:  [80-108] 100 (09/14 0831) Resp:  [17-20] 20 (09/14 0831) BP: (89-118)/(58-82) 94/58 mmHg (09/14 0614) SpO2:  [95 %-100 %] 95 % (09/14 0831) Weight:  [69.8 kg (153 lb 14.1 oz)-72.3 kg (159 lb 6.3 oz)] 70.3 kg (154 lb 15.7 oz) (09/14 9604) Weight change: 1.4 kg (3 lb 1.4 oz)  Intake/Output from previous day: 09/13 0701 - 09/14 0700 In: 50 [NG/GT:50] Out: 1502  Intake/Output this shift:    General appearance: alert and cooperative Resp: decreased BS, occ rhonchi Cardio: regular rate and rhythm, S1, S2 normal, no murmur, click, rub or gallop Extremities: no edema, thrill LUE  Lab Results:  Recent Labs  12/14/14 0519 12/15/14 1653  WBC 7.8 9.5  HGB 10.4* 9.4*  HCT 33.1* 30.5*  PLT 367 401*   BMET:  Recent Labs  12/14/14 0519 12/15/14 1654  NA 135 133*  K 4.3 4.2  CL 94* 90*  CO2 30 29  GLUCOSE 122* 100*  BUN 50* 82*  CREATININE 5.54* 7.60*  CALCIUM 9.8 9.7   No results for input(s): PTH in the last 72 hours. Iron Studies:  Recent Labs  12/15/14 1815  IRON 34  TIBC 315  FERRITIN 679*   Studies/Results: No results found.  Scheduled: . ALPRAZolam  0.5 mg Per Tube Q T,Th,Sa-HD  . antiseptic oral rinse  7 mL Mouth Rinse TID  . aspirin  325 mg Oral Daily  . budesonide  0.25 mg Nebulization BID  . chlorhexidine  15 mL Mouth/Throat BID  . [START ON 12/17/2014] darbepoetin (ARANESP) injection - DIALYSIS  100 mcg  Intravenous Q Thu-HD  . free water  50 mL Per Tube TID  . guaifenesin  200 mg Per Tube TID  . ipratropium-albuterol  3 mL Nebulization TID  . lanolin   Topical BID  . levETIRAcetam  500 mg Oral BID  . levothyroxine  25 mcg Oral QAC breakfast  . lipase/protease/amylase  2 capsule Oral Once   And  . sodium bicarbonate  650 mg Oral Once  . methylphenidate  10 mg Per Tube BID WC  . mirtazapine  7.5 mg Per Tube QHS  . multivitamin  1 tablet Oral QHS  . pantoprazole sodium  40 mg Per Tube Q1200    LOS: 21 days   Antrobus,Corey Caulfield C 12/16/2014,9:14 AM

## 2014-12-16 NOTE — Progress Notes (Signed)
Social Work [name], female   DOB: Dec 22, 1955, 59 y.o.   MRN: [id number] Met with pt and spoke with daughter via telephone to discuss team conference and the progress pt has made this week. Both very pleased and excited pt passed her swallow test this am. Agreeable to the team's recommendation of extending her stay until 9/22.  Daughter is coming up later but very pleased with her Mom's progress and pt is much brighter and conversing more. Daughter aware she will need more family education and her brother also needs education. Will continue to follow and assist with discharge needs.

## 2014-12-16 NOTE — Progress Notes (Signed)
Speech Language Pathology Note  Patient Details  Name: Selenne C Gilpin MRN: [id number] Date of Birth: [birth year] Today's Date: 12/16/2014  MBSS complete. Full report located under chart review in imaging section.    Loretta Kluender, Melanee Spry 12/16/2014, 12:22 PM

## 2014-12-16 NOTE — Progress Notes (Signed)
Physical Therapy Session Note  Patient Details  Name: [name] MRN: [id number] Date of Birth: 06-Aug-1955  Today's Date: 12/16/2014 PT Individual Time: 1130-1205 and 1315-1415 PT Individual Time Calculation (min): 35 min and 60 min (total 95 min)  Short Term Goals: Week 3:  PT Short Term Goal 1 (Week 3): Pt will perform bed mobility supervision and min cues PT Short Term Goal 2 (Week 3): Pt will perform stand pivot transfer consistent minA with mod cues for sequencing PT Short Term Goal 3 (Week 3): Pt will perform gait with LRAD x75' with supervision overall PT Short Term Goal 4 (Week 3): Pt will perform 8 3-inch stairs with modA PT Short Term Goal 5 (Week 3): Pt will perform standing balance x5 min with BUE support and supervision  Skilled Therapeutic Interventions/Progress Updates:    1130-1200: Pt received supine in bed, no c/o pain and agreeable to treatment. Supine>sit with supervision, HOB elevated and use of UEs on bedrail. Sit >stand minA and elevated bed after pt unable to stand on initial attempt. Stand pivot transfer with CGA. Stair training x8 3-inch stairs with CGA overall with step-to pattern and UEs on rails; one mild LOB requiring minA from therapist and increased reliance on UEs. Pt returned to room; stand pivot from TIS w/c to standard w/c with steadying assist. Pt left seated in w/c with QR belt intact and all needs within reach, NA present.   1300-1400: Pt received seated in w/c, no c/o pain and agreeable to treatment. Pt requests to use bathroom, as determined through yes/no questions after pt appears anxious and signaling to therapist. Stand pivot transfer w/c <>BSC over toilet with minA and use of grab bars. Sitting balance with supervision on toilet. Donning/doffing pants and brief, personal hygiene with totalA. Gait training for one trial of 50' with rollator and CGA. NMR for BLE performed with standing alternate LE tapping to 1" step. Pt initially unable to perform  RLE taps due to impaired motor planning, poor weight shift and SLS on LLE. With pt seated, practiced alternating LE tapping to improve motor learning and increase carryover to standing trial. Second attempt in standing pt still unable to tap RLE to step. Therapist repositioned to L side to increase pt confidence with LLE weight shifting, and pt able to perform RLE tap with minA. Performed sitting and standing soccer ball kicks with alternating LEs, performed to improve reactive balance strategies, dynamic weight shifting, SLS balance. Pt requires suction to clear secretions after significant coughing. Pt left seated in w/c with QR belt intact and all needs within reach, daughter present at completion of session.   Therapy Documentation Precautions:  Precautions Precautions: Fall Precaution Comments: fall, NPO, pusher to the right at times, non-verbal,  Restrictions Weight Bearing Restrictions: No Pain: Pain Assessment Pain Assessment: No/denies pain Pain Score: 0-No pain   See Function Navigator for Current Functional Status.   Therapy/Group: Individual Therapy  [name] 12/16/2014, 12:17 PM

## 2014-12-16 NOTE — Progress Notes (Signed)
Subjective/Complaints: Discussed with SLP, pt tried bedside with honey and pureed consistencies Did well not cough, going for MBS today  ROS cannot obtain due to expressive aphasia   Objective: Vital Signs: Blood pressure 94/58, pulse 100, temperature 97.6 F (36.4 C), temperature source Oral, resp. rate 20, weight 70.3 kg (154 lb 15.7 oz), SpO2 95 %. No results found. Results for orders placed or performed during the hospital encounter of 11/25/14 (from the past 72 hour(s))  Glucose, capillary     Status: None   Collection Time: 12/13/14 11:53 AM  Result Value Ref Range   Glucose-Capillary 79 65 - 99 mg/dL   Comment 1 Notify RN   Glucose, capillary     Status: None   Collection Time: 12/13/14  6:52 PM  Result Value Ref Range   Glucose-Capillary 90 65 - 99 mg/dL   Comment 1 Notify RN   Glucose, capillary     Status: Abnormal   Collection Time: 12/14/14 12:05 AM  Result Value Ref Range   Glucose-Capillary 101 (H) 65 - 99 mg/dL   Comment 1 Notify RN   CBC     Status: Abnormal   Collection Time: 12/14/14  5:19 AM  Result Value Ref Range   WBC 7.8 4.0 - 10.5 K/uL    Comment: REPEATED TO VERIFY   RBC 3.76 (L) 3.87 - 5.11 MIL/uL   Hemoglobin 10.4 (L) 12.0 - 15.0 g/dL    Comment: REPEATED TO VERIFY   HCT 33.1 (L) 36.0 - 46.0 %   MCV 88.0 78.0 - 100.0 fL   MCH 27.7 26.0 - 34.0 pg   MCHC 31.4 30.0 - 36.0 g/dL   RDW 19.2 (H) 11.5 - 15.5 %   Platelets 367 150 - 400 K/uL    Comment: PLATELET COUNT CONFIRMED BY SMEAR  Basic metabolic panel     Status: Abnormal   Collection Time: 12/14/14  5:19 AM  Result Value Ref Range   Sodium 135 135 - 145 mmol/L   Potassium 4.3 3.5 - 5.1 mmol/L   Chloride 94 (L) 101 - 111 mmol/L   CO2 30 22 - 32 mmol/L   Glucose, Bld 122 (H) 65 - 99 mg/dL   BUN 50 (H) 6 - 20 mg/dL   Creatinine, Ser 5.54 (H) 0.44 - 1.00 mg/dL   Calcium 9.8 8.9 - 10.3 mg/dL   GFR calc non Af Amer 8 (L) >60 mL/min   GFR calc Af Amer 9 (L) >60 mL/min    Comment:  (NOTE) The eGFR has been calculated using the CKD EPI equation. This calculation has not been validated in all clinical situations. eGFR's persistently <60 mL/min signify possible Chronic Kidney Disease.    Anion gap 11 5 - 15  Glucose, capillary     Status: Abnormal   Collection Time: 12/14/14  6:20 AM  Result Value Ref Range   Glucose-Capillary 113 (H) 65 - 99 mg/dL  Glucose, capillary     Status: None   Collection Time: 12/14/14 12:03 PM  Result Value Ref Range   Glucose-Capillary 86 65 - 99 mg/dL  Glucose, capillary     Status: None   Collection Time: 12/14/14  6:43 PM  Result Value Ref Range   Glucose-Capillary 99 65 - 99 mg/dL   Comment 1 Notify RN   Glucose, capillary     Status: None   Collection Time: 12/14/14 11:48 PM  Result Value Ref Range   Glucose-Capillary 94 65 - 99 mg/dL  Glucose, capillary  Status: Abnormal   Collection Time: 12/15/14  5:54 AM  Result Value Ref Range   Glucose-Capillary 118 (H) 65 - 99 mg/dL  Glucose, capillary     Status: None   Collection Time: 12/15/14 11:43 AM  Result Value Ref Range   Glucose-Capillary 78 65 - 99 mg/dL   Comment 1 Notify RN   CBC with Differential/Platelet     Status: Abnormal   Collection Time: 12/15/14  4:53 PM  Result Value Ref Range   WBC 9.5 4.0 - 10.5 K/uL   RBC 3.50 (L) 3.87 - 5.11 MIL/uL   Hemoglobin 9.4 (L) 12.0 - 15.0 g/dL   HCT 30.5 (L) 36.0 - 46.0 %   MCV 87.1 78.0 - 100.0 fL   MCH 26.9 26.0 - 34.0 pg   MCHC 30.8 30.0 - 36.0 g/dL   RDW 19.2 (H) 11.5 - 15.5 %   Platelets 401 (H) 150 - 400 K/uL   Neutrophils Relative % 62 43 - 77 %   Lymphocytes Relative 21 12 - 46 %   Monocytes Relative 8 3 - 12 %   Eosinophils Relative 9 (H) 0 - 5 %   Basophils Relative 0 0 - 1 %   Neutro Abs 5.8 1.7 - 7.7 K/uL   Lymphs Abs 2.0 0.7 - 4.0 K/uL   Monocytes Absolute 0.8 0.1 - 1.0 K/uL   Eosinophils Absolute 0.9 (H) 0.0 - 0.7 K/uL   Basophils Absolute 0.0 0.0 - 0.1 K/uL   RBC Morphology POLYCHROMASIA PRESENT    Renal function panel     Status: Abnormal   Collection Time: 12/15/14  4:54 PM  Result Value Ref Range   Sodium 133 (L) 135 - 145 mmol/L   Potassium 4.2 3.5 - 5.1 mmol/L   Chloride 90 (L) 101 - 111 mmol/L   CO2 29 22 - 32 mmol/L   Glucose, Bld 100 (H) 65 - 99 mg/dL   BUN 82 (H) 6 - 20 mg/dL   Creatinine, Ser 7.60 (H) 0.44 - 1.00 mg/dL   Calcium 9.7 8.9 - 10.3 mg/dL   Phosphorus 3.3 2.5 - 4.6 mg/dL   Albumin 2.8 (L) 3.5 - 5.0 g/dL   GFR calc non Af Amer 5 (L) >60 mL/min   GFR calc Af Amer 6 (L) >60 mL/min    Comment: (NOTE) The eGFR has been calculated using the CKD EPI equation. This calculation has not been validated in all clinical situations. eGFR's persistently <60 mL/min signify possible Chronic Kidney Disease.    Anion gap 14 5 - 15  Ferritin     Status: Abnormal   Collection Time: 12/15/14  6:15 PM  Result Value Ref Range   Ferritin 679 (H) 11 - 307 ng/mL  Iron and TIBC     Status: None   Collection Time: 12/15/14  6:15 PM  Result Value Ref Range   Iron 34 28 - 170 ug/dL   TIBC 315 250 - 450 ug/dL   Saturation Ratios 11 10.4 - 31.8 %   UIBC 281 ug/dL  Glucose, capillary     Status: Abnormal   Collection Time: 12/16/14 12:57 AM  Result Value Ref Range   Glucose-Capillary 127 (H) 65 - 99 mg/dL  Glucose, capillary     Status: Abnormal   Collection Time: 12/16/14  6:10 AM  Result Value Ref Range   Glucose-Capillary 126 (H) 65 - 99 mg/dL     HEENT: poor oral movements  facial diplegia, cannot open mouth to command but opens spont, Cardio:  RRR and no murmur, no pain to palp of chest wall Resp: CTA B/L.no use of accessory muscles, No wheezes and no upper airway sounds, not able to cough on command GI: BS positive and NT, ND, prior midline abd incision with extensive scarring, scab with old sutures attached Extremity:  Pulses positive and No Edema Skin:   Other G tube site without drainage Neuro: Flat but very alert, Cranial Nerve Abnormalities bilateral central  7,Aphasic and Apraxic, nodding for Y/N questions, unable to test sensory due to aphasia but does withdraw to pinch Musc/Skel:  Other decrease ROM in BIlateral knees and Right shoulder and elbow due to tone, no pain with UE or LE ROM Gen NAD   Assessment/Plan: 1. 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 Physiatrist is providing close team supervision and 24 hour management of active medical problems listed below. Physiatrist and rehab team continue to assess barriers to discharge/monitor patient progress toward functional and medical goals.  Team conference today please see physician documentation under team conference tab, met with team face-to-face to discuss problems,progress, and goals. Formulized individual treatment plan based on medical history, underlying problem and comorbidities.  FIM: Function - Bathing Position: Wheelchair/chair at sink Body parts bathed by patient: Right upper leg, Left upper leg, Right arm, Left arm, Chest Body parts bathed by helper: Abdomen, Front perineal area, Buttocks, Right lower leg, Left lower leg, Back Bathing not applicable: Right lower leg, Left lower leg Assist Level: Touching or steadying assistance(Pt > 75%)  Function- Upper Body Dressing/Undressing What is the patient wearing?: Pull over shirt/dress, Bra Bra - Perfomed by patient: Thread/unthread left bra strap Bra - Perfomed by helper: Thread/unthread right bra strap, Hook/unhook bra (pull down sports bra) Pull over shirt/dress - Perfomed by patient: Thread/unthread left sleeve, Pull shirt over trunk, Thread/unthread right sleeve Pull over shirt/dress - Perfomed by helper: Put head through opening Assist Level: Touching or steadying assistance(Pt > 75%) Set up : To obtain  clothing/put away Function - Lower Body Dressing/Undressing What is the patient wearing?: Non-skid slipper socks, Pants Position: Wheelchair/chair at sink Pants- Performed by helper: Thread/unthread right pants leg, Thread/unthread left pants leg, Pull pants up/down Non-skid slipper socks- Performed by helper: Don/doff right sock, Don/doff left sock Assist Level: Touching or steadying assistance (Pt > 75%)  Function - Toileting Toileting activity did not occur: No continent bowel/bladder event Toileting steps completed by helper: Adjust clothing prior to toileting, Performs perineal hygiene, Adjust clothing after toileting Toileting Assistive Devices: Other (comment) (UE support on sink) Assist level: Two helpers  Function - Air cabin crew transfer activity did not occur: Safety/medical concerns Toilet transfer assistive device: Drop arm commode Assist level to toilet: 2 helpers Assist level from toilet: 2 helpers  Function - Chair/bed transfer Chair/bed transfer method: Stand pivot Chair/bed transfer assist level: Touching or steadying assistance (Pt > 75%) Chair/bed transfer assistive device: Armrests, Walker Chair/bed transfer details: Verbal cues for sequencing, Tactile cues for sequencing  Function - Locomotion: Wheelchair Will patient use wheelchair at discharge?: Yes Wheelchair activity did not occur:  (Attempted hand-over-hand facilitation for w/c propulsion, visual demonstration, however no pt initiation evident) Assist Level: Dependent (Pt equals 0%) Function - Locomotion: Ambulation Ambulation activity did not occur: Safety/medical concerns (Attempted ambulation, however upon standing x3 reps pt unable to step (unable to determine if due to \  apraxia or pt fearful)) Assistive device: Other (comment) (rollator) Max distance: 100 Assist level: Touching or steadying assistance (Pt > 75%) Walk 10 feet activity did not occur: Safety/medical concerns Assist level:  Touching or steadying assistance (Pt > 75%) Walk 50 feet with 2 turns activity did not occur: Safety/medical concerns Assist level: Touching or steadying assistance (Pt > 75%) Walk 150 feet activity did not occur: Safety/medical concerns Assist level: 2 helpers Walk 10 feet on uneven surfaces activity did not occur: Safety/medical concerns  Function - Comprehension Comprehension: Auditory Comprehension assist level: Understands basic less than 25% of the time/ requires cueing >75% of the time  Function - Expression Expression: Nonverbal Expression assistive device: Other (Comment) Expression assist level: Expresses basis less than 25% of the time/requires cueing >75% of the time.  Function - Social Interaction Social Interaction assist level: Interacts appropriately 25 - 49% of time - Needs frequent redirection.  Function - Problem Solving Problem solving assist level: Solves basic less than 25% of the time - needs direction nearly all the time or does not effectively solve problems and may need a restraint for safety  Function - Memory Memory assist level: Recognizes or recalls less than 25% of the time/requires cueing greater than 75% of the time Patient normally able to recall (first 3 days only): None of the above  Medical Problem List and Plan: 1. Functional deficits secondary to left MCA infarct/respiratory failure. Right hemiparesis improving, now using Y/N nods, Alertness increasing with ritalin 2. DVT Prophylaxis/Anticoagulation: SCDs. Monitor for any signs of DVT. 8/12 venous dopplers neg, mobility increasing so risk is as well 3. Pain Management: Tylenol as needed, no apparent pain during PT 4. Dysphagia. Patient is nothing by mouth. Status post jejunostomy tube 08/12/2014 per interventional radiology for nutritional support. poor prognosis for improvement given severe bilateral deficits, do not anticipate d/c J tube during this admission,  5. Neuropsych: This patient is  not capable of making decisions on her own behalf. 6. Skin/Wound Care: Routine skin checks. J tube site maintenance. continue turning and nutrition, site looks good 7. Fluids/Electrolytes/Nutrition: Routine I&O. Adjust TF based on nutritional needs. Renal adjusting dialysis, complicated situation, Not a diabetic, getting CBGs because of TF, these have been normal, Dietary writing TF orders, now on nocturnal tube feeds only If pt passes swallow  Will likely be mainly therapeutic feeds at first 8. End-stage renal disease with hemodialysis. Follow-up per renal services. Hemodialysis in PM after therapies to avoid conflict, Nepro for nutrition, hypoalbuminemia related to ESRD 9. Seizure prophylaxis. Keppra 500 mg twice a day, monitor drug toxicity. No seizures, Ritalin can decrease seizure threshold however no evidence of this thus far 10. Hypothyroidism. Synthroid 11. Chronic anemia. Aranesp weekly with dialysis, hgb monitored in HD, nephro managing 12. History of asthma. Schedule nebulizers given aphasia. , no wheezing today, no volitional cough, secretions improving 13. Mood/anxiety. Continue Xanax on dialysis days 14.  Hx of recurrent resp failure, high risk for PNA given comorbid conditions,  RR increases at times. Comfortable. Secretions better. Afebrile. No distress.    -wean oxygen down to keep sats >90% 15.  Bowel incont- monitor skin 16.  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

## 2014-12-16 NOTE — Progress Notes (Signed)
Occupational Therapy Session Note  Patient Details  Name: [name] MRN: [id number] Date of Birth: 11/11/1955  Today's Date: 12/16/2014 OT Individual Time: 4098-1191 OT Individual Time Calculation (min): 60 min    Short Term Goals: Week 3:  OT Short Term Goal 1 (Week 3): Pt will perform UB bathing with mod assist sitting supported. OT Short Term Goal 2 (Week 3): Pt will complete UB dressing with mod assist  OT Short Term Goal 3 (Week 3): Pt will complete LB dressing with max assist at sit > stand level OT Short Term Goal 4 (Week 3): Pt will perform toilet transfer stand pivot with no more than mod assist.   Skilled Therapeutic Interventions/Progress Updates:    ADL retraining with focus on initiation, problem solving, and increased participation in self-care tasks.  Pt in bed finishing breathing treatment upon arrival.  Gathered clothing in preparation for bathing and dressing with pt gesturing and nodding yes/no to pick out clothing.  Pt with spontaneous initiation to come to EOB without verbal cues from therapist (just setting up w/c next to bed).  Pt continues to require min cues for sequencing with mobility and constant cues for stepping to transfer bed > w/c.  Discussed plan to complete bathing at shower level with pt adamantly shaking head "no".  Bathing and dressing completed at sit > stand level at sink with pt demonstrating increased initiation with gathering cloth from basin (as opposed to being placed in hands prior).  Mod assist (lifting) for sit > stand and tactile cues for forward weight shift.  Pt overall pleased with progress when therapist highlighting progress.  Therapy Documentation Precautions:  Precautions Precautions: Fall Precaution Comments: fall, NPO, pusher to the right at times, non-verbal,  Restrictions Weight Bearing Restrictions: No General:   Vital Signs: Therapy Vitals Pulse Rate: 100 Resp: 20 Patient Position (if appropriate): Lying Oxygen  Therapy SpO2: 95 % O2 Device: Nasal Cannula O2 Flow Rate (L/min): 2 L/min Pain: Pain Assessment Pain Assessment: No/denies pain Pain Score: 0-No pain  See Function Navigator for Current Functional Status.   Therapy/Group: Individual Therapy  Rosalio Loud 12/16/2014, 12:22 PM

## 2014-12-17 ENCOUNTER — Inpatient Hospital Stay (HOSPITAL_COMMUNITY): Payer: Medicaid Other | Admitting: Physical Therapy

## 2014-12-17 ENCOUNTER — Inpatient Hospital Stay (HOSPITAL_COMMUNITY): Payer: Medicaid Other | Admitting: Occupational Therapy

## 2014-12-17 ENCOUNTER — Inpatient Hospital Stay (HOSPITAL_COMMUNITY): Payer: Medicaid Other | Admitting: Speech Pathology

## 2014-12-17 LAB — RENAL FUNCTION PANEL
Albumin: 3.4 g/dL — ABNORMAL LOW (ref 3.5–5.0)
Anion gap: 16 — ABNORMAL HIGH (ref 5–15)
BUN: 59 mg/dL — ABNORMAL HIGH (ref 6–20)
CO2: 27 mmol/L (ref 22–32)
Calcium: 10.2 mg/dL (ref 8.9–10.3)
Chloride: 94 mmol/L — ABNORMAL LOW (ref 101–111)
Creatinine, Ser: 6.16 mg/dL — ABNORMAL HIGH (ref 0.44–1.00)
GFR calc Af Amer: 8 mL/min — ABNORMAL LOW (ref >60)
GFR calc non Af Amer: 7 mL/min — ABNORMAL LOW (ref >60)
Glucose, Bld: 81 mg/dL (ref 65–99)
Phosphorus: 3.5 mg/dL (ref 2.5–4.6)
Potassium: 4.4 mmol/L (ref 3.5–5.1)
Sodium: 137 mmol/L (ref 135–145)

## 2014-12-17 LAB — CBC
HEMATOCRIT: 36.2 % (ref 36.0–46.0)
HEMOGLOBIN: 11.3 g/dL — AB (ref 12.0–15.0)
MCH: 27.5 pg (ref 26.0–34.0)
MCHC: 31.2 g/dL (ref 30.0–36.0)
MCV: 88.1 fL (ref 78.0–100.0)
Platelets: 353 10*3/uL (ref 150–400)
RBC: 4.11 MIL/uL (ref 3.87–5.11)
RDW: 19.3 % — ABNORMAL HIGH (ref 11.5–15.5)
WBC: 8.8 10*3/uL (ref 4.0–10.5)

## 2014-12-17 LAB — GLUCOSE, CAPILLARY
Glucose-Capillary: 104 mg/dL — ABNORMAL HIGH (ref 65–99)
Glucose-Capillary: 117 mg/dL — ABNORMAL HIGH (ref 65–99)
Glucose-Capillary: 79 mg/dL (ref 65–99)
Glucose-Capillary: 86 mg/dL (ref 65–99)

## 2014-12-17 MED ORDER — LIDOCAINE HCL (PF) 1 % IJ SOLN: 5.0000 mL | INTRAMUSCULAR | Status: DC | PRN

## 2014-12-17 MED ORDER — IPRATROPIUM-ALBUTEROL 0.5-2.5 (3) MG/3ML IN SOLN
RESPIRATORY_TRACT | Status: AC
  Filled 2014-12-17: qty 3

## 2014-12-17 MED ORDER — ALTEPLASE 2 MG IJ SOLR
2.0000 mg | Freq: Once | INTRAMUSCULAR | Status: DC | PRN
Start: 2014-12-17 — End: 2014-12-19

## 2014-12-17 MED ORDER — PENTAFLUOROPROP-TETRAFLUOROETH EX AERO: 1.0000 "application " | INHALATION_SPRAY | CUTANEOUS | Status: DC | PRN

## 2014-12-17 MED ORDER — SODIUM CHLORIDE 0.9 % IV SOLN: 100.0000 mL | INTRAVENOUS | Status: DC | PRN

## 2014-12-17 MED ORDER — HEPARIN SODIUM (PORCINE) 1000 UNIT/ML DIALYSIS: 20.0000 [IU]/kg | INTRAMUSCULAR | Status: DC | PRN

## 2014-12-17 MED ORDER — HEPARIN SODIUM (PORCINE) 1000 UNIT/ML DIALYSIS: 1000.0000 [IU] | INTRAMUSCULAR | Status: DC | PRN

## 2014-12-17 MED ORDER — DARBEPOETIN ALFA 100 MCG/0.5ML IJ SOSY
100.0000 ug | PREFILLED_SYRINGE | INTRAMUSCULAR | Status: DC
  Filled 2014-12-17: qty 0.5

## 2014-12-17 MED ORDER — LIDOCAINE-PRILOCAINE 2.5-2.5 % EX CREA: 1.0000 "application " | TOPICAL_CREAM | CUTANEOUS | Status: DC | PRN

## 2014-12-17 NOTE — Progress Notes (Signed)
Subjective/Complaints: Hypoglycemia TF held while pt attempting po.  ~25% meal intake Pt nods no to whether she is enjoying her meals  ROS cannot obtain due to expressive aphasia   Objective: Vital Signs: Blood pressure 102/68, pulse 97, temperature 98.8 F (37.1 C), temperature source Oral, resp. rate 20, weight 71.2 kg (156 lb 15.5 oz), SpO2 93 %. Dg Swallowing Func-speech Pathology  12/16/2014    Objective Swallowing Evaluation:    Patient Details  Name: [name] MRN: [id number] Date of Birth: 1955-08-01  Today's Date: 12/16/2014 Time: SLP Start Time: 1000-SLP Stop Time: 1030 SLP Time Calculation (min) (ACUTE ONLY): 30 min  Past Medical History:  Past Medical History  Diagnosis Date  . Asthma   . Hypertension   . Gout   . Arthritis   . Insomnia   . Chronic kidney disease   . Depression   . Renal insufficiency   . Aortic aneurysm without rupture     3cm by CT 08/03/14  . Paroxysmal SVT (supraventricular tachycardia)   . Cor pulmonale   . Stroke 06/2014  . PFO (patent foramen ovale)   . COPD (chronic obstructive pulmonary disease)    Past Surgical History:  Past Surgical History  Procedure Laterality Date  . Multiple tooth extractions    . Av fistula placement Left 01/28/2014    Procedure: ARTERIOVENOUS (AV) FISTULA CREATION;  Surgeon: Sherren Kerns, MD;  Location: [facility] OR;  Service: Vascular;  Laterality: Left;  . Tee without cardioversion N/A 06/23/2014    Procedure: TRANSESOPHAGEAL ECHOCARDIOGRAM (TEE);  Surgeon: Lewayne Bunting, MD;  Location: [facility] ENDOSCOPY;  Service: Cardiovascular;   Laterality: N/A;  . Insertion of dialysis catheter Right 06/27/2014    Procedure: INSERTION OF Right Internal Jugular DIATEK CATHETER;   Surgeon: Pryor Ochoa, MD;  Location: [facility] OR;  Service: Vascular;   Laterality: Right;  . Laparotomy N/A 07/01/2014    Procedure: EXPLORATORY LAPAROTOMY WITH CLOSURE OF PERFORATED PYLORIC  ULCER;  Surgeon: Claud Kelp, MD;  Location: [facility] OR;  Service: General;   Laterality:  N/A;  . Bowel resection N/A 07/01/2014    Procedure: SMALL BOWEL RESECTION;  Surgeon: Claud Kelp, MD;   Location: [facility] OR;  Service: General;  Laterality: N/A;  . Revison of arteriovenous fistula Left 09/09/2014    Procedure: LIGATION OF COMPETING BRANCH, & RESECTION OF VENOUS ANEURYSM  OF LEFT UPPER ARM ARTERIOVENOUS FISTULA;  Surgeon: Larina Earthly, MD;   Location: [facility] OR;  Service: Vascular;  Laterality: Left;   HPI:  Other Pertinent Information: 59 year old female admitted with sudden onset  right sided weakness and left gaze deviation. Upon arrival to ED, labored  respirations requiring intubation 8/9. Dx with moderate size left MCA CVA.   Extubated 8/19. PMH of prolonged hospitalization 06/17/14-09/11/14 for  right hemisphere acute infarcts involving right ACA, left PCA, and left  MCA, asthma, gout, depression, COPD.  Pt remains NPO per overt s/s of  aspiration noted at bedside.  Objective swallow study ordered today to  determine readiness for initiation of PO diet given improvements noted at  bedside.     Assessment / Plan / Recommendation CHL IP CLINICAL IMPRESSIONS 12/16/2014  Therapy Diagnosis Moderate oral phase dysphagia;Moderate pharyngeal phase  dysphagia  Clinical Impression Pt presents with moderately severe oropharyngeal  deficits with both sensory and motor components.  Pt presents with right  sided labial motor weakness in addition to oral apraxia which results in  delayed oral transit, weakened lingual manipulation,  and decreased  cohesion of boluses.  Decreased bolus cohesion resulted in premature  spillage of materials into the pharynx with coating of base of tongue and  swallow response triggered at the level of vallecula with purees, solids,  and honey thick liquids.  Swallow response was delayed to the pyriforms  with nectar thick liquids which resulted in silent trace aspiration.  Pt  was able to cough volitionally following aspiration; however, given  decreased cough strength and delay in  initiation of cough, cough was not  effective for clearing aspirates from the airway.  A chin tuck was  effective for improving airway protection during the swallow with nectar  thick liquids; however pt required max to total tactile cues to achieve  adequate posture.  No postural modifications were needed with honey thick  liquids.  As a result, recommend initiating a diet of dys 1 textures and  honey thick liquids, meds crushed in puree.  Recommend full supervision  for use of the following swallowing precautions: slow rate, small  bites/sips, out of bed for meals.        CHL IP TREATMENT RECOMMENDATION 11/23/2014  Treatment Recommendations Therapy as outlined in treatment plan below     CHL IP DIET RECOMMENDATION 12/16/2014  SLP Diet Recommendations Dysphagia 1 (Puree);Honey  Liquid Administration via (None)  Medication Administration Crushed with puree  Compensations Minimize environmental distractions;Slow rate;Small  sips/bites;Check for pocketing  Postural Changes and/or Swallow Maneuvers Out of bed for meals                   SLP Swallow Goals     CHL IP REASON FOR REFERRAL 12/16/2014  Reason for Referral Objectively evaluate swallowing function     CHL IP ORAL PHASE 12/16/2014  Lips (None)  Tongue (None)  Mucous membranes (None)  Nutritional status (None)  Other (None)  Oxygen therapy (None)  Oral Phase Impaired  Oral - Pudding Teaspoon (None)  Oral - Pudding Cup (None)  Oral - Honey Teaspoon (None)  Oral - Honey Cup (None)  Oral - Honey Syringe (None)  Oral - Nectar Teaspoon (None)  Oral - Nectar Cup (None)  Oral - Nectar Straw (None)  Oral - Nectar Syringe (None)  Oral - Ice Chips (None)  Oral - Thin Teaspoon (None)  Oral - Thin Cup (None)  Oral - Thin Straw (None)  Oral - Thin Syringe (None)  Oral - Puree (None) Oral - Mechanical Soft (None)  Oral - Regular (None)  Oral - Multi-consistency (None)  Oral - Pill (None)  Oral Phase - Comment (None)      CHL IP PHARYNGEAL PHASE 12/16/2014  Pharyngeal Phase  Impaired  Pharyngeal - Pudding Teaspoon (None)  Penetration/Aspiration details (pudding teaspoon) (None)  Pharyngeal - Pudding Cup (None)  Penetration/Aspiration details (pudding cup) (None)  Pharyngeal - Honey Teaspoon (None)  Penetration/Aspiration details (honey teaspoon) (None)  Pharyngeal - Honey Cup (None)  Penetration/Aspiration details (honey cup) (None)  Pharyngeal - Honey Syringe (None)  Penetration/Aspiration details (honey syringe) (None)  Pharyngeal - Nectar Teaspoon (None)  Penetration/Aspiration details (nectar teaspoon) (None)  Pharyngeal - Nectar Cup (None)  Penetration/Aspiration details (nectar cup) (None)  Pharyngeal - Nectar Straw (None)  Penetration/Aspiration details (nectar straw) (None)  Pharyngeal - Nectar Syringe (None)  Penetration/Aspiration details (nectar syringe) (None)  Pharyngeal - Ice Chips (None)  Penetration/Aspiration details (ice chips) (None)  Pharyngeal - Thin Teaspoon (None)  Penetration/Aspiration details (thin teaspoon) (None)  Pharyngeal - Thin Cup (None)  Penetration/Aspiration details (thin cup) (None)  Pharyngeal - Thin Straw (None)  Penetration/Aspiration details (thin straw) (None)  Pharyngeal - Thin Syringe (None)  Penetration/Aspiration details (thin syringe') (None)  Pharyngeal - Puree (None)  Penetration/Aspiration details (puree) (None)  Pharyngeal - Mechanical Soft (None)  Penetration/Aspiration details (mechanical soft) (None)  Pharyngeal - Regular (None)  Penetration/Aspiration details (regular) (None)  Pharyngeal - Multi-consistency (None)  Penetration/Aspiration details (multi-consistency) (None)  Pharyngeal - Pill (None)  Penetration/Aspiration details (pill) (None)  Pharyngeal Comment (None)            Page, Selinda Orion 12/16/2014, 11:51 AM    Results for orders placed or performed during the hospital encounter of 11/25/14 (from the past 72 hour(s))  Glucose, capillary     Status: None   Collection Time: 12/14/14 12:03 PM  Result Value Ref Range    Glucose-Capillary 86 65 - 99 mg/dL  Glucose, capillary     Status: None   Collection Time: 12/14/14  6:43 PM  Result Value Ref Range   Glucose-Capillary 99 65 - 99 mg/dL   Comment 1 Notify RN   Glucose, capillary     Status: None   Collection Time: 12/14/14 11:48 PM  Result Value Ref Range   Glucose-Capillary 94 65 - 99 mg/dL  Glucose, capillary     Status: Abnormal   Collection Time: 12/15/14  5:54 AM  Result Value Ref Range   Glucose-Capillary 118 (H) 65 - 99 mg/dL  Glucose, capillary     Status: None   Collection Time: 12/15/14 11:43 AM  Result Value Ref Range   Glucose-Capillary 78 65 - 99 mg/dL   Comment 1 Notify RN   CBC with Differential/Platelet     Status: Abnormal   Collection Time: 12/15/14  4:53 PM  Result Value Ref Range   WBC 9.5 4.0 - 10.5 K/uL   RBC 3.50 (L) 3.87 - 5.11 MIL/uL   Hemoglobin 9.4 (L) 12.0 - 15.0 g/dL   HCT 30.5 (L) 36.0 - 46.0 %   MCV 87.1 78.0 - 100.0 fL   MCH 26.9 26.0 - 34.0 pg   MCHC 30.8 30.0 - 36.0 g/dL   RDW 19.2 (H) 11.5 - 15.5 %   Platelets 401 (H) 150 - 400 K/uL   Neutrophils Relative % 62 43 - 77 %   Lymphocytes Relative 21 12 - 46 %   Monocytes Relative 8 3 - 12 %   Eosinophils Relative 9 (H) 0 - 5 %   Basophils Relative 0 0 - 1 %   Neutro Abs 5.8 1.7 - 7.7 K/uL   Lymphs Abs 2.0 0.7 - 4.0 K/uL   Monocytes Absolute 0.8 0.1 - 1.0 K/uL   Eosinophils Absolute 0.9 (H) 0.0 - 0.7 K/uL   Basophils Absolute 0.0 0.0 - 0.1 K/uL   RBC Morphology POLYCHROMASIA PRESENT   Renal function panel     Status: Abnormal   Collection Time: 12/15/14  4:54 PM  Result Value Ref Range   Sodium 133 (L) 135 - 145 mmol/L   Potassium 4.2 3.5 - 5.1 mmol/L   Chloride 90 (L) 101 - 111 mmol/L   CO2 29 22 - 32 mmol/L   Glucose, Bld 100 (H) 65 - 99 mg/dL   BUN 82 (H) 6 - 20 mg/dL   Creatinine, Ser 7.60 (H) 0.44 - 1.00 mg/dL   Calcium 9.7 8.9 - 10.3 mg/dL   Phosphorus 3.3 2.5 - 4.6 mg/dL   Albumin 2.8 (L) 3.5 - 5.0 g/dL   GFR calc non Af Amer 5 (L) >60  mL/min   GFR calc Af Amer 6 (L) >60 mL/min    Comment: (NOTE) The eGFR has been calculated using the CKD EPI equation. This calculation has not been validated in all clinical situations. eGFR's persistently <60 mL/min signify possible Chronic Kidney Disease.    Anion gap 14 5 - 15  Ferritin     Status: Abnormal   Collection Time: 12/15/14  6:15 PM  Result Value Ref Range   Ferritin 679 (H) 11 - 307 ng/mL  Iron and TIBC     Status: None   Collection Time: 12/15/14  6:15 PM  Result Value Ref Range   Iron 34 28 - 170 ug/dL   TIBC 315 250 - 450 ug/dL   Saturation Ratios 11 10.4 - 31.8 %   UIBC 281 ug/dL  Glucose, capillary     Status: Abnormal   Collection Time: 12/16/14 12:57 AM  Result Value Ref Range   Glucose-Capillary 127 (H) 65 - 99 mg/dL  Glucose, capillary     Status: Abnormal   Collection Time: 12/16/14  6:10 AM  Result Value Ref Range   Glucose-Capillary 126 (H) 65 - 99 mg/dL  Glucose, capillary     Status: None   Collection Time: 12/16/14 12:07 PM  Result Value Ref Range   Glucose-Capillary 88 65 - 99 mg/dL   Comment 1 Notify RN   Glucose, capillary     Status: None   Collection Time: 12/16/14  4:24 PM  Result Value Ref Range   Glucose-Capillary 67 65 - 99 mg/dL   Comment 1 Notify RN   Glucose, capillary     Status: None   Collection Time: 12/16/14  5:02 PM  Result Value Ref Range   Glucose-Capillary 77 65 - 99 mg/dL  Glucose, capillary     Status: Abnormal   Collection Time: 12/17/14 12:26 AM  Result Value Ref Range   Glucose-Capillary 117 (H) 65 - 99 mg/dL  Glucose, capillary     Status: Abnormal   Collection Time: 12/17/14  6:09 AM  Result Value Ref Range   Glucose-Capillary 104 (H) 65 - 99 mg/dL     HEENT: poor oral movements   Cardio: RRR and no murmur, no pain to palp of chest wall Resp: CTA B/L.no use of accessory muscles, No wheezes and no upper airway sounds, not able to cough on command GI: BS positive and NT, ND, prior midline abd incision with  extensive scarring, scab with old sutures attached Extremity:  Pulses positive and No Edema Skin:    G tube site non tender Neuro: Flat but very alert, Cranial Nerve Abnormalities bilateral central 7,Aphasic and Apraxic, nodding for Y/N questions,rubbing R fingers together Musc/Skel:  Other decrease ROM in BIlateral knees and Right shoulder and elbow due to tone, no pain with UE or LE ROM Gen NAD   Assessment/Plan: 1. 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 Physiatrist is providing close team supervision and 24 hour management of active medical problems listed below. Physiatrist and rehab team continue to assess barriers to discharge/monitor patient progress toward functional and medical goals.   FIM: Function - Bathing Position: Wheelchair/chair at sink Body parts bathed by patient: Chest, Right upper leg, Left upper leg Body parts bathed by helper: Right arm, Left arm, Abdomen, Front perineal area, Buttocks, Back Bathing not applicable: Right lower  leg, Left lower leg Assist Level: Touching or steadying assistance(Pt > 75%)  Function- Upper Body Dressing/Undressing What is the patient wearing?: Pull over shirt/dress, Bra Bra - Perfomed by patient: Thread/unthread right bra strap, Thread/unthread left bra strap Bra - Perfomed by helper: Hook/unhook bra (pull down sports bra) Pull over shirt/dress - Perfomed by patient: Thread/unthread right sleeve, Thread/unthread left sleeve, Put head through opening, Pull shirt over trunk Pull over shirt/dress - Perfomed by helper: Put head through opening Assist Level: Touching or steadying assistance(Pt > 75%) Set up : To obtain clothing/put away Function - Lower Body Dressing/Undressing What is the patient wearing?: Non-skid  slipper socks, Pants Position: Wheelchair/chair at sink Pants- Performed by helper: Thread/unthread right pants leg, Thread/unthread left pants leg, Pull pants up/down Non-skid slipper socks- Performed by helper: Don/doff right sock, Don/doff left sock Assist Level: Touching or steadying assistance (Pt > 75%)  Function - Toileting Toileting activity did not occur: No continent bowel/bladder event Toileting steps completed by helper: Adjust clothing prior to toileting, Performs perineal hygiene, Adjust clothing after toileting Toileting Assistive Devices: Grab bar or rail Assist level: Touching or steadying assistance (Pt.75%)  Function - Air cabin crew transfer activity did not occur: Safety/medical concerns Toilet transfer assistive device: Elevated toilet seat/BSC over toilet Assist level to toilet: Touching or steadying assistance (Pt > 75%) Assist level from toilet: Touching or steadying assistance (Pt > 75%)  Function - Chair/bed transfer Chair/bed transfer method: Stand pivot Chair/bed transfer assist level: Touching or steadying assistance (Pt > 75%) Chair/bed transfer assistive device: Armrests, Walker Chair/bed transfer details: Verbal cues for sequencing, Tactile cues for sequencing  Function - Locomotion: Wheelchair Will patient use wheelchair at discharge?: Yes Wheelchair activity did not occur:  (Attempted hand-over-hand facilitation for w/c propulsion, visual demonstration, however no pt initiation evident) Assist Level: Dependent (Pt equals 0%) Function - Locomotion: Ambulation Ambulation activity did not occur: Safety/medical concerns (Attempted ambulation, however upon standing x3 reps pt unable to step (unable to determine if due to$Remove' \\[id number]$  or pt fearful)) Assistive device: Other (comment) (rollator) Max distance: 75 Assist level: Touching or steadying assistance (Pt > 75%) Walk 10 feet activity did not occur: Safety/medical concerns Assist level:  Touching or steadying assistance (Pt > 75%) Walk 50 feet with 2 turns activity did not occur: Safety/medical concerns Assist level: Touching or steadying assistance (Pt > 75%) Walk 150 feet activity did not occur: Safety/medical concerns Assist level: 2 helpers Walk 10 feet on uneven surfaces activity did not occur: Safety/medical concerns  Function - Comprehension Comprehension: Auditory Comprehension assist level: Understands basic 25 - 49% of the time/ requires cueing 50 - 75% of the time  Function - Expression Expression: Nonverbal Expression assistive device: Other (Comment) Expression assist level: Expresses basic 25 - 49% of the time/requires cueing 50 - 75% of the time. Uses single words/gestures.  Function - Social Interaction Social Interaction assist level: Interacts appropriately 25 - 49% of time - Needs frequent redirection.  Function - Problem Solving Problem solving assist level: Solves basic 25 - 49% of the time - needs direction more than half the time to initiate, plan or complete simple activities  Function - Memory Memory assist level: Recognizes or recalls 25 - 49% of the time/requires cueing 50 - 75% of the time Patient normally able to recall (first 3 days only): None of the above  Medical Problem List and Plan: 1. Functional deficits secondary to left MCA infarct/respiratory failure. Right hemiparesis improving, now using Y/N nods, Alertness increasing with ritalin-some  evidence of excess motor activity but no tachycardia 2. DVT Prophylaxis/Anticoagulation: SCDs. Monitor for any signs of DVT. 8/12 venous dopplers neg, mobility increasing so risk is as well 3. Pain Management: Tylenol as needed, no apparent pain during PT 4. Dysphagia. Patient is nothing by mouth. Status post jejunostomy tube 08/12/2014 per interventional radiology for nutritional support. poor prognosis for improvement given severe bilateral deficits, do not anticipate d/c J tube during this  admission,  5. Neuropsych: This patient is not capable of making decisions on her own behalf. 6. Skin/Wound Care: Routine skin checks. J tube site maintenance. continue turning and nutrition, site looks good 7. Fluids/Electrolytes/Nutrition: Routine I&O. Adjust TF based on nutritional needs. Renal adjusting dialysis, complicated situation, Not a diabetic, getting CBGs because of TF, these have been normal, Dietary writing TF orders, now on nocturnal tube feeds only If pt passes swallow  Will likely be mainly therapeutic feeds at first 8. End-stage renal disease with hemodialysis. Follow-up per renal services. Hemodialysis in PM after therapies to avoid conflict, Nepro for nutrition, hypoalbuminemia related to ESRD 9. Seizure prophylaxis. Keppra 500 mg twice a day, monitor drug toxicity. No seizures, Ritalin can decrease seizure threshold however no evidence of this thus far 10. Hypothyroidism. Synthroid 11. Chronic anemia. Aranesp weekly with dialysis, hgb monitored in HD, nephro managing 12. History of asthma. Schedule nebulizers given aphasia. , no wheezing today, no volitional cough, secretions improving 13. Mood/anxiety. Continue Xanax on dialysis days 14.  Hx of recurrent resp failure, high risk for PNA given comorbid conditions,  RR increases at times. Comfortable. Secretions better. Afebrile. No distress.Now trialing po, monitor for aspiration    -wean oxygen down to keep sats >90% 15.  Bowel incont- monitor skin 16.  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

## 2014-12-17 NOTE — Progress Notes (Signed)
Speech Language Pathology Daily Session Note  Patient Details  Name: [name] MRN: [id number] Date of Birth: Sep 03, 1955  Today's Date: 12/17/2014 SLP Individual Time: 1100-1200 SLP Individual Time Calculation (min): 60 min  Short Term Goals: Week 4: SLP Short Term Goal 1 (Week 4): Pt will improve management of her secretions as evidenced by ability to orally expectorate secretions and anteriorly contain her saliva with max assist multimodal cues.   SLP Short Term Goal 2 (Week 4): Pt will consume dys 1 textures and honey thick liquids with mod multimodal cues for use of swallowing precautions and minimal overt s/s  of aspiration.   SLP Short Term Goal 3 (Week 4): Pt will initiate a basic,familiar task with mod assist verbal cues over 75% of observable opportunities.  SLP Short Term Goal 4 (Week 4): Pt will visually scan to midline in >75% of observable opportunities during basic, functional tasks with mod verbal cues.  SLP Short Term Goal 5 (Week 4): Pt will vocalize to produce vowels in isolation with max assist multimodal cues.   SLP Short Term Goal 6 (Week 4): Pt will communicate basic wants and needs via multimodal communication with Mod assist multimodal cues  Skilled Therapeutic Interventions:  Pt was seen for skilled ST targeting goals for dysphagia and cognition.  Pt was received from OT session, seated upright in wheelchair, awake, tearful, but agreeable to participate in therapy.  SLP facilitated the session with skilled observations completed during presentations of her currently prescribed diet.  Pt required max encouragement for PO intake of dys 1 textures and honey thick liquids.  No overt s/s of aspiration were evident with purees or liquids; however, trials were significantly limited due to pt's refusal to eat more than a few bites or sips of offered trials. Trials of dys 2 textures were also offered to motivate pt to increase PO intake; however, pt only consumed ~5 bites of  trials before declining further POs.  No overt s/s of aspiration were evident with currently prescribed diet but pt did exhibit cough x1  on dys 2 textures.  SLP also facilitated the session with a basic card game targeting functional problem solving and sustained attention to task.  Pt required min-mod verbal cues to match cards based by color and/or number and she sustained her attention to task for ~3 minute intervals with min cues for redirection.  Pt was returned to room at the end of today's therapy session and left upright in wheelchair with quick release belt donned and call bell within reach.    Function:  Eating Eating   Modified Consistency Diet: Yes Eating Assist Level: Help with picking up utensils;Help managing cup/glass;Set up assist for;Supervision or verbal cues   Eating Set Up Assist For: Opening containers Helper Scoops Food on Utensil: Occasionally Helper Brings Food to Mouth: Occasionally   Cognition Comprehension Comprehension assist level: Understands basic 25 - 49% of the time/ requires cueing 50 - 75% of the time  Expression   Expression assist level: Expresses basic 25 - 49% of the time/requires cueing 50 - 75% of the time. Uses single words/gestures.  Social Interaction Social Interaction assist level: Interacts appropriately 25 - 49% of time - Needs frequent redirection.  Problem Solving Problem solving assist level: Solves basic 25 - 49% of the time - needs direction more than half the time to initiate, plan or complete simple activities  Memory Memory assist level: Recognizes or recalls 25 - 49% of the time/requires cueing 50 - 75% of  the time    Pain Pain Assessment Pain Assessment: No/denies pain  Therapy/Group: Individual Therapy  Jadea Shiffer, Melanee Spry 12/17/2014, 3:47 PM

## 2014-12-17 NOTE — Progress Notes (Signed)
Occupational Therapy Session Note  Patient Details  Name: Baby C Alire MRN: [id number] Date of Birth: 1956-03-09  Today's Date: 12/17/2014 OT Individual Time: 1000-1100 OT Individual Time Calculation (min): 60 min    Short Term Goals: Week 3:  OT Short Term Goal 1 (Week 3): Pt will perform UB bathing with mod assist sitting supported. OT Short Term Goal 2 (Week 3): Pt will complete UB dressing with mod assist  OT Short Term Goal 3 (Week 3): Pt will complete LB dressing with max assist at sit > stand level OT Short Term Goal 4 (Week 3): Pt will perform toilet transfer stand pivot with no more than mod assist.   Skilled Therapeutic Interventions/Progress Updates:    Treatment session with focus on initiation, sit <> stand, and functional use of RUE. Upon arrival, pt already dressed, making gestures and grabbing for therapist's hand.  Multiple questions with therapist able to decipher that pt wanting to toilet.  Stand pivot transfer w/c > toilet with min/steady assist - noted improved initiation and weight shifting for sit > stand.  Pt continues to require assist with LB dressing/hygiene, however attempted to pull pants up on Rt side.  In therapy gym, completed therapeutic activity with focus on sit > stand, standing tolerance, and functional use of RUE.  Pt demonstrating difficulty releasing items after grasping them with Rt hand and pt perseverating on certain movements.  Pt became visibly frustrated with difficulties with RUE despite vast improvements during rehab stay.  Pt performed stand pivot transfer back to w/c with supervision only, no physical assist.  Therapy Documentation Precautions:  Precautions Precautions: Fall Precaution Comments: fall, NPO, pusher to the right at times, non-verbal,  Restrictions Weight Bearing Restrictions: No Pain: Pain Assessment Pain Assessment: No/denies pain Pain Score: 0-No pain  See Function Navigator for Current Functional  Status.   Therapy/Group: Individual Therapy  Rosalio Loud 12/17/2014, 12:15 PM

## 2014-12-17 NOTE — Progress Notes (Signed)
Physical Therapy Weekly Progress Note  Patient Details  Name: [name] MRN: [id number] Date of Birth: 01-10-1956  Beginning of progress report period: December 10, 2014 End of progress report period: December 17, 2014  Today's Date: 12/17/2014 PT Individual Time: 0800-0900 and 1330-1400 PT Individual Time Calculation (min): 60 min and 30 min (total 90 min)   Patient has met 5 of 5 short term goals due to improved initiation, participation, endurance, strength, balance and functional mobility.  Patient continues to demonstrate the following deficits: strength, activity tolerance, balance and therefore will continue to benefit from skilled PT intervention to enhance overall performance with activity tolerance, balance, postural control, ability to compensate for deficits, functional use of  right upper extremity and right lower extremity, attention, awareness and coordination.  Patient progressing toward long term goals..  Plan of care revisions: goals upgraded due to extension of d/c date. See goal sheet for details..  PT Short Term Goals Week 3:  PT Short Term Goal 1 (Week 3): Pt will perform bed mobility supervision and min cues PT Short Term Goal 1 - Progress (Week 3): Met PT Short Term Goal 2 (Week 3): Pt will perform stand pivot transfer consistent minA with mod cues for sequencing PT Short Term Goal 2 - Progress (Week 3): Met PT Short Term Goal 3 (Week 3): Pt will perform gait with LRAD x75' with supervision overall PT Short Term Goal 3 - Progress (Week 3): Met PT Short Term Goal 4 (Week 3): Pt will perform 8 3-inch stairs with modA PT Short Term Goal 4 - Progress (Week 3): Met PT Short Term Goal 5 (Week 3): Pt will perform standing balance x5 min with BUE support and supervision PT Short Term Goal 5 - Progress (Week 3): Met Week 4:  PT Short Term Goal 1 (Week 4): = LTG due to ELOS  Skilled Therapeutic Interventions/Progress Updates:    0800-0900: Pt received supine in bed,  no c/o pain and agreeable to treatment. Supine >sit with HOB elevated at bedrails with minA. Upper body dressing performed modA, with modA for removal of shirt, and mod verbal cues and supervision for donning new shirt. Lower body dressing performed maxA, pt assisted with pulling pants up with R hand while standing with UE support on back of w/c. Stand pivot bed>w/c with minA, cues for completing pivot turn before sitting in w/c. Seated at sink pt performs hygiene with supervision. Required modA for brushing teeth due to impaired sequencing. Stand pivot w/c >mat table. Sit <>stand from edge of mat table x2 min while O2 assessed with pt on 2L o2; machine has difficulty reading O2; however stabilizes to 91% after approximately 30 seconds. O2 increased to 3L for remainder of session with O2 remaining above 95%. Performed 2 sets 10 reps alternating LE tapping to 1" step, minA for balance and facilitation of L weight shift to [place] RLE for step. . Pt signals to therapist something is wrong with her feet; after several minutes of yes/no questioning, determined pt wants clean pair of socks. New socks donned, remained seated in w/c with all needs within reach at completion of session.   1330-1400: Pt received seated in w/c, no c/o pain and agreeable to treatment. Gait training for two trials of 150' with rollator and steadying assist. Initial trial on 3L O2; again difficulty with O2 measurement. Pt agreeable to remove nail polish, educated regarding goal of reducing O2 reliance and importance of accurate readings to determine safety. Second trial performed on 2L  O2; measurement following activity at 100%. RN requests pt return to bed in preparation for dialysis. Stand pivot w/c >bed CGA; sit <>stand and side steps towards [facility] for better positioning in bed. Sit >supine maxA due to apraxia and time constraints. Pt remained supine in bed with all needs within reach at completion of session, RN and daughter present.    Therapy Documentation Precautions:  Precautions Precautions: Fall Precaution Comments: fall, NPO, pusher to the right at times, non-verbal,  Restrictions Weight Bearing Restrictions: No Pain: Pain Assessment Pain Assessment: No/denies pain Pain Score: 0-No pain   See Function Navigator for Current Functional Status.  Therapy/Group: Individual Therapy  Luberta Mutter 12/17/2014, 11:58 AM

## 2014-12-17 NOTE — Progress Notes (Signed)
Assessment/Plan: 1. 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 ESRD -TTS on HD, for today.   Objective: Vital signs in last 24 hours: Temp:  [98.3 F (36.8 C)-98.8 F (37.1 C)] 98.3 F (36.8 C) (09/15 1422) Pulse Rate:  [97-109] 109 (09/15 1500) Resp:  [18-20] 18 (09/15 1422) BP: (90-145)/(47-86) 92/47 mmHg (09/15 1600) SpO2:  [92 %-98 %] 92 % (09/15 1510) Weight:  [71.2 kg (156 lb 15.5 oz)] 71.2 kg (156 lb 15.5 oz) (09/15 0612) Weight change: -1.1 kg (-2 lb 6.8 oz)  Intake/Output from previous day: 09/14 0701 - 09/15 0700 In: 120 [P.O.:70; NG/GT:50] Out: -  Intake/Output this shift: Total I/O In: 90 [P.O.:90] Out: -   General appearance: alert and cooperative Resp: decreased BS, occ rhonchi Cardio: regular rate and rhythm, S1, S2 normal, no murmur, click, rub or gallop Extremities: no edema, thrill LUE Aphasic & hemiplegic on R  Lab Results:  Recent Labs  12/15/14 1653 12/17/14 1337  WBC 9.5 8.8  HGB 9.4* 11.3*  HCT 30.5* 36.2  PLT 401* 353   BMET:  Recent Labs  12/15/14 1654 12/17/14 1337  NA 133* 137  K 4.2 4.4  CL 90* 94*  CO2 29 27  GLUCOSE 100* 81  BUN 82* 59*  CREATININE 7.60* 6.16*  CALCIUM 9.7 10.2   No results for input(s): PTH in the last 72 hours. Iron Studies:  Recent Labs  12/15/14 1815  IRON 34  TIBC 315  FERRITIN 679*   Studies/Results: Dg Swallowing Func-speech Pathology  12/16/2014    Objective Swallowing Evaluation:    Patient Details  Name: [name] MRN: [id number] Date of Birth: 11-06-1955  Today's Date: 12/16/2014 Time: SLP Start Time: 1000-SLP Stop Time: 1030 SLP Time Calculation (min) (ACUTE ONLY): 30 min  Past Medical History:  Past Medical History  Diagnosis Date  . Asthma   . Hypertension   . Gout   . Arthritis   . Insomnia   . Chronic kidney disease   . Depression   . Renal insufficiency   . Aortic aneurysm without rupture     3cm by CT 08/03/14  . Paroxysmal SVT (supraventricular tachycardia)   . Cor pulmonale    . Stroke 06/2014  . PFO (patent foramen ovale)   . COPD (chronic obstructive pulmonary disease)    Past Surgical History:  Past Surgical History  Procedure Laterality Date  . Multiple tooth extractions    . Av fistula placement Left 01/28/2014    Procedure: ARTERIOVENOUS (AV) FISTULA CREATION;  Surgeon: Sherren Kerns, MD;  Location: [facility] OR;  Service: Vascular;  Laterality: Left;  . Tee without cardioversion N/A 06/23/2014    Procedure: TRANSESOPHAGEAL ECHOCARDIOGRAM (TEE);  Surgeon: Lewayne Bunting, MD;  Location: [facility] ENDOSCOPY;  Service: Cardiovascular;   Laterality: N/A;  . Insertion of dialysis catheter Right 06/27/2014    Procedure: INSERTION OF Right Internal Jugular DIATEK CATHETER;   Surgeon: Pryor Ochoa, MD;  Location: [facility] OR;  Service: Vascular;   Laterality: Right;  . Laparotomy N/A 07/01/2014    Procedure: EXPLORATORY LAPAROTOMY WITH CLOSURE OF PERFORATED PYLORIC  ULCER;  Surgeon: Claud Kelp, MD;  Location: [facility] OR;  Service: General;   Laterality: N/A;  . Bowel resection N/A 07/01/2014    Procedure: SMALL BOWEL RESECTION;  Surgeon: Claud Kelp, MD;   Location: [facility] OR;  Service: General;  Laterality: N/A;  . [place] of arteriovenous fistula Left 09/09/2014    Procedure: LIGATION OF COMPETING BRANCH, & RESECTION  OF VENOUS ANEURYSM  OF LEFT UPPER ARM ARTERIOVENOUS FISTULA;  Surgeon: Larina Earthly, MD;   Location: [facility] OR;  Service: Vascular;  Laterality: Left;   HPI:  Other Pertinent Information: 59 year old female admitted with sudden onset  right sided weakness and left gaze deviation. Upon arrival to ED, labored  respirations requiring intubation 8/9. Dx with moderate size left MCA CVA.   Extubated 8/19. PMH of prolonged hospitalization 06/17/14-09/11/14 for  right hemisphere acute infarcts involving right ACA, left PCA, and left  MCA, asthma, gout, depression, COPD.  Pt remains NPO per overt s/s of  aspiration noted at bedside.  Objective swallow study ordered today to  determine readiness for  initiation of PO diet given improvements noted at  bedside.     Assessment / Plan / Recommendation CHL IP CLINICAL IMPRESSIONS 12/16/2014  Therapy Diagnosis Moderate oral phase dysphagia;Moderate pharyngeal phase  dysphagia  Clinical Impression Pt presents with moderately severe oropharyngeal  deficits with both sensory and motor components.  Pt presents with right  sided labial motor weakness in addition to oral apraxia which results in  delayed oral transit, weakened lingual manipulation, and decreased  cohesion of boluses.  Decreased bolus cohesion resulted in premature  spillage of materials into the pharynx with coating of base of tongue and  swallow response triggered at the level of vallecula with purees, solids,  and honey thick liquids.  Swallow response was delayed to the pyriforms  with nectar thick liquids which resulted in silent trace aspiration.  Pt  was able to cough volitionally following aspiration; however, given  decreased cough strength and delay in initiation of cough, cough was not  effective for clearing aspirates from the airway.  A chin tuck was  effective for improving airway protection during the swallow with nectar  thick liquids; however pt required max to total tactile cues to achieve  adequate posture.  No postural modifications were needed with honey thick  liquids.  As a result, recommend initiating a diet of dys 1 textures and  honey thick liquids, meds crushed in puree.  Recommend full supervision  for use of the following swallowing precautions: slow rate, small  bites/sips, out of bed for meals.        CHL IP TREATMENT RECOMMENDATION 11/23/2014  Treatment Recommendations Therapy as outlined in treatment plan below     CHL IP DIET RECOMMENDATION 12/16/2014  SLP Diet Recommendations Dysphagia 1 (Puree);Honey  Liquid Administration via (None)  Medication Administration Crushed with puree  Compensations Minimize environmental distractions;Slow rate;Small  sips/bites;Check for pocketing   Postural Changes and/or Swallow Maneuvers Out of bed for meals                   SLP Swallow Goals     CHL IP REASON FOR REFERRAL 12/16/2014  Reason for Referral Objectively evaluate swallowing function     CHL IP ORAL PHASE 12/16/2014  Lips (None)  Tongue (None)  Mucous membranes (None)  Nutritional status (None)  Other (None)  Oxygen therapy (None)  Oral Phase Impaired  Oral - Pudding Teaspoon (None)  Oral - Pudding Cup (None)  Oral - Honey Teaspoon (None)  Oral - Honey Cup (None)  Oral - Honey Syringe (None)  Oral - Nectar Teaspoon (None)  Oral - Nectar Cup (None)  Oral - Nectar Straw (None)  Oral - Nectar Syringe (None)  Oral - Ice Chips (None)  Oral - Thin Teaspoon (None)  Oral - Thin Cup (None)  Oral - Thin Straw (  None)  Oral - Thin Syringe (None)  Oral - Puree (None) Oral - Mechanical Soft (None)  Oral - Regular (None)  Oral - Multi-consistency (None)  Oral - Pill (None)  Oral Phase - Comment (None)      CHL IP PHARYNGEAL PHASE 12/16/2014  Pharyngeal Phase Impaired  Pharyngeal - Pudding Teaspoon (None)  Penetration/Aspiration details (pudding teaspoon) (None)  Pharyngeal - Pudding Cup (None)  Penetration/Aspiration details (pudding cup) (None)  Pharyngeal - Honey Teaspoon (None)  Penetration/Aspiration details (honey teaspoon) (None)  Pharyngeal - Honey Cup (None)  Penetration/Aspiration details (honey cup) (None)  Pharyngeal - Honey Syringe (None)  Penetration/Aspiration details (honey syringe) (None)  Pharyngeal - Nectar Teaspoon (None)  Penetration/Aspiration details (nectar teaspoon) (None)  Pharyngeal - Nectar Cup (None)  Penetration/Aspiration details (nectar cup) (None)  Pharyngeal - Nectar Straw (None)  Penetration/Aspiration details (nectar straw) (None)  Pharyngeal - Nectar Syringe (None)  Penetration/Aspiration details (nectar syringe) (None)  Pharyngeal - Ice Chips (None)  Penetration/Aspiration details (ice chips) (None)  Pharyngeal - Thin Teaspoon (None)  Penetration/Aspiration details (thin  teaspoon) (None)  Pharyngeal - Thin Cup (None)  Penetration/Aspiration details (thin cup) (None)  Pharyngeal - Thin Straw (None)  Penetration/Aspiration details (thin straw) (None)  Pharyngeal - Thin Syringe (None)  Penetration/Aspiration details (thin syringe') (None)  Pharyngeal - Puree (None)  Penetration/Aspiration details (puree) (None)  Pharyngeal - Mechanical Soft (None)  Penetration/Aspiration details (mechanical soft) (None)  Pharyngeal - Regular (None)  Penetration/Aspiration details (regular) (None)  Pharyngeal - Multi-consistency (None)  Penetration/Aspiration details (multi-consistency) (None)  Pharyngeal - Pill (None)  Penetration/Aspiration details (pill) (None)  Pharyngeal Comment (None)            Page, Joni Reining L 12/16/2014, 11:51 AM      LOS: 22 days   Houpt,Elgin Carn C 12/17/2014,4:40 PM

## 2014-12-18 ENCOUNTER — Inpatient Hospital Stay (HOSPITAL_COMMUNITY): Payer: Medicaid Other | Admitting: Physical Therapy

## 2014-12-18 ENCOUNTER — Inpatient Hospital Stay (HOSPITAL_COMMUNITY): Payer: Medicaid Other | Admitting: Speech Pathology

## 2014-12-18 ENCOUNTER — Inpatient Hospital Stay (HOSPITAL_COMMUNITY): Payer: Medicaid Other | Admitting: Occupational Therapy

## 2014-12-18 LAB — HEPATITIS B SURFACE ANTIGEN: Hepatitis B Surface Ag: NEGATIVE

## 2014-12-18 LAB — GLUCOSE, CAPILLARY
GLUCOSE-CAPILLARY: 111 mg/dL — AB (ref 65–99)
GLUCOSE-CAPILLARY: 118 mg/dL — AB (ref 65–99)
GLUCOSE-CAPILLARY: 61 mg/dL — AB (ref 65–99)
GLUCOSE-CAPILLARY: 90 mg/dL (ref 65–99)
Glucose-Capillary: 101 mg/dL — ABNORMAL HIGH (ref 65–99)
Glucose-Capillary: 44 mg/dL — CL (ref 65–99)

## 2014-12-18 MED ORDER — GLUCOSE 40 % PO GEL
ORAL | Status: AC
  Administered 2014-12-18: 37.5 g
  Filled 2014-12-18: qty 1

## 2014-12-18 NOTE — Progress Notes (Signed)
Physical Therapy Session Note  Patient Details  Name: [name] MRN: [id number] Date of Birth: 11/21/1955  Today's Date: 12/18/2014 PT Individual Time: 0800-0900 and 1400-1425 PT Individual Time Calculation (min): 60 min and 25 min (total 85 min)  Short Term Goals: Week 4:  PT Short Term Goal 1 (Week 4): = LTG due to ELOS  Skilled Therapeutic Interventions/Progress Updates:    0800-0900: Pt received asleep, supine in bed. Somnolent and requires several cues and increased time to awake. Slow performance with all activities this AM due to lethargy, pt denies pain. With much encouragement, pt performed supine >sit with cues for BUE on bedrail and minA. O2 measured at 90% while seated on EOB on 2L O2 via [place]. Requested RT come for breathing treatment to improve O2. Pt refuses dressing. Stand pivot bed >w/c with modA and increased time for side stepping due to apraxia. Seated rest break while pt receives breathing treatment. Pt again declines dressing. Cleaned face and applied facial cream at sink while seated in w/c with supervision. O2 removed during self-care, remained >95% during first 5 minutes. Sit >stand and standing x2 min while donning brief with BUE support on sink; supervision for standing balance and totalA for donning brief. After sitting, O2 measured at 80% on room air. Pt instructed in deep breathing, utilized dynamap for biofeedback to demonstrate to pt improvement in O2 with deep breaths. Pt has difficulty taking deep breaths when cued, given demonstration, however pt shakes head no when cued to breaths as if she is unable or having difficulty. O2 returned to >95% after 1-2 min of cueing for deep breathing, however when cues removed, O2 drops to below 90%. Pt returned to w/c, remained seated in w/c with all needs in reach at completion of session and O2 via [place] intact on 2L O2.   1400-1430: Pt received seated in w/c with handoff from SLP after previous session. No c/o pain and agreeable  to treatment. SLP informed therapist that pt was labile during previous session. Stand pivot transfer w/c <> bed in simulated apartment with close supervision for w/c > bed, and CGA for bed>w/c due to lower height and compliant surface of bed. Performed x2 trials. Discussed body postioning with chest further out over LEs to improve sit>stand, pt demonstrates improved performance and less assist needed for second trial. Discussed use of hospital bed at home upon d/c and pt appears hesitant. Told pt to consider it over the weekend and will talk again on Monday. Educated on importance of independence, improved breathing with head elevated. Pt returned to room and remained seated in w/c with all needs in reach at completion of session.   Therapy Documentation Precautions:  Precautions Precautions: Fall Precaution Comments: fall, NPO, pusher to the right at times, non-verbal,  Restrictions Weight Bearing Restrictions: No Pain: Pain Assessment Pain Assessment: No/denies pain Pain Score: 0-No pain   See Function Navigator for Current Functional Status.   Therapy/Group: Individual Therapy  [name] 12/18/2014, 11:04 AM

## 2014-12-18 NOTE — Progress Notes (Signed)
Subjective/Complaints: Appreciate nephro note Pt still not vocalizing but is able to follow commands  ROS cannot obtain due to expressive aphasia   Objective: Vital Signs: Blood pressure 96/72, pulse 110, temperature 98.2 F (36.8 C), temperature source Oral, resp. rate 18, weight 71.3 kg (157 lb 3 oz), SpO2 99 %. Dg Swallowing Func-speech Pathology  12/16/2014    Objective Swallowing Evaluation:    Patient Details  Name: [name] MRN: [id number] Date of Birth: 09/03/1955  Today's Date: 12/16/2014 Time: SLP Start Time: 1000-SLP Stop Time: 39 SLP Time Calculation (min) (ACUTE ONLY): 30 min  Past Medical History:  Past Medical History  Diagnosis Date  . Asthma   . Hypertension   . Gout   . Arthritis   . Insomnia   . Chronic kidney disease   . Depression   . Renal insufficiency   . Aortic aneurysm without rupture     3cm by CT 08/03/14  . Paroxysmal SVT (supraventricular tachycardia)   . Cor pulmonale   . Stroke 06/2014  . PFO (patent foramen ovale)   . COPD (chronic obstructive pulmonary disease)    Past Surgical History:  Past Surgical History  Procedure Laterality Date  . Multiple tooth extractions    . Av fistula placement Left 01/28/2014    Procedure: ARTERIOVENOUS (AV) FISTULA CREATION;  Surgeon: Elam Dutch, MD;  Location: [place];  Service: Vascular;  Laterality: Left;  . Tee without cardioversion N/A 06/23/2014    Procedure: TRANSESOPHAGEAL ECHOCARDIOGRAM (TEE);  Surgeon: Lelon Perla, MD;  Location: [facility];  Service: Cardiovascular;   Laterality: N/A;  . Insertion of dialysis catheter Right 06/27/2014    Procedure: INSERTION OF Right Internal Jugular DIATEK CATHETER;   Surgeon: Mal Misty, MD;  Location: [place];  Service: Vascular;   Laterality: Right;  . Laparotomy N/A 07/01/2014    Procedure: EXPLORATORY LAPAROTOMY WITH CLOSURE OF PERFORATED PYLORIC  ULCER;  Surgeon: Fanny Skates, MD;  Location: [place];  Service: General;   Laterality: N/A;  . Bowel resection N/A 07/01/2014     Procedure: SMALL BOWEL RESECTION;  Surgeon: Fanny Skates, MD;   Location: [place];  Service: General;  Laterality: N/A;  . Revison of arteriovenous fistula Left 09/09/2014    Procedure: LIGATION OF COMPETING BRANCH, & RESECTION OF VENOUS ANEURYSM  OF LEFT UPPER ARM ARTERIOVENOUS FISTULA;  Surgeon: Rosetta Posner, MD;   Location: [facility];  Service: Vascular;  Laterality: Left;   HPI:  Other Pertinent Information: 59 year old female admitted with sudden onset  right sided weakness and left gaze deviation. Upon arrival to ED, labored  respirations requiring intubation 8/9. Dx with moderate size left MCA CVA.   Extubated 8/19. PMH of prolonged hospitalization 06/17/14-09/11/14 for  right hemisphere acute infarcts involving right ACA, left PCA, and left  MCA, asthma, gout, depression, COPD.  Pt remains NPO per overt s/s of  aspiration noted at bedside.  Objective swallow study ordered today to  determine readiness for initiation of PO diet given improvements noted at  bedside.     Assessment / Plan / Recommendation CHL IP CLINICAL IMPRESSIONS 12/16/2014  Therapy Diagnosis Moderate oral phase dysphagia;Moderate pharyngeal phase  dysphagia  Clinical Impression Pt presents with moderately severe oropharyngeal  deficits with both sensory and motor components.  Pt presents with right  sided labial motor weakness in addition to oral apraxia which results in  delayed oral transit, weakened lingual manipulation, and decreased  cohesion of boluses.  Decreased  bolus cohesion resulted in premature  spillage of materials into the pharynx with coating of base of tongue and  swallow response triggered at the level of vallecula with purees, solids,  and honey thick liquids.  Swallow response was delayed to the pyriforms  with nectar thick liquids which resulted in silent trace aspiration.  Pt  was able to cough volitionally following aspiration; however, given  decreased cough strength and delay in initiation of cough, cough was not   effective for clearing aspirates from the airway.  A chin tuck was  effective for improving airway protection during the swallow with nectar  thick liquids; however pt required max to total tactile cues to achieve  adequate posture.  No postural modifications were needed with honey thick  liquids.  As a result, recommend initiating a diet of dys 1 textures and  honey thick liquids, meds crushed in puree.  Recommend full supervision  for use of the following swallowing precautions: slow rate, small  bites/sips, out of bed for meals.        CHL IP TREATMENT RECOMMENDATION 11/23/2014  Treatment Recommendations Therapy as outlined in treatment plan below     CHL IP DIET RECOMMENDATION 12/16/2014  SLP Diet Recommendations Dysphagia 1 (Puree);Honey  Liquid Administration via (None)  Medication Administration Crushed with puree  Compensations Minimize environmental distractions;Slow rate;Small  sips/bites;Check for pocketing  Postural Changes and/or Swallow Maneuvers Out of bed for meals                   SLP Swallow Goals     CHL IP REASON FOR REFERRAL 12/16/2014  Reason for Referral Objectively evaluate swallowing function     CHL IP ORAL PHASE 12/16/2014  Lips (None)  Tongue (None)  Mucous membranes (None)  Nutritional status (None)  Other (None)  Oxygen therapy (None)  Oral Phase Impaired  Oral - Pudding Teaspoon (None)  Oral - Pudding Cup (None)  Oral - Honey Teaspoon (None)  Oral - Honey Cup (None)  Oral - Honey Syringe (None)  Oral - Nectar Teaspoon (None)  Oral - Nectar Cup (None)  Oral - Nectar Straw (None)  Oral - Nectar Syringe (None)  Oral - Ice Chips (None)  Oral - Thin Teaspoon (None)  Oral - Thin Cup (None)  Oral - Thin Straw (None)  Oral - Thin Syringe (None)  Oral - Puree (None) Oral - Mechanical Soft (None)  Oral - Regular (None)  Oral - Multi-consistency (None)  Oral - Pill (None)  Oral Phase - Comment (None)      CHL IP PHARYNGEAL PHASE 12/16/2014  Pharyngeal Phase Impaired  Pharyngeal - Pudding Teaspoon  (None)  Penetration/Aspiration details (pudding teaspoon) (None)  Pharyngeal - Pudding Cup (None)  Penetration/Aspiration details (pudding cup) (None)  Pharyngeal - Honey Teaspoon (None)  Penetration/Aspiration details (honey teaspoon) (None)  Pharyngeal - Honey Cup (None)  Penetration/Aspiration details (honey cup) (None)  Pharyngeal - Honey Syringe (None)  Penetration/Aspiration details (honey syringe) (None)  Pharyngeal - Nectar Teaspoon (None)  Penetration/Aspiration details (nectar teaspoon) (None)  Pharyngeal - Nectar Cup (None)  Penetration/Aspiration details (nectar cup) (None)  Pharyngeal - Nectar Straw (None)  Penetration/Aspiration details (nectar straw) (None)  Pharyngeal - Nectar Syringe (None)  Penetration/Aspiration details (nectar syringe) (None)  Pharyngeal - Ice Chips (None)  Penetration/Aspiration details (ice chips) (None)  Pharyngeal - Thin Teaspoon (None)  Penetration/Aspiration details (thin teaspoon) (None)  Pharyngeal - Thin Cup (None)  Penetration/Aspiration details (thin cup) (None)  Pharyngeal - Thin Straw (None)  Penetration/Aspiration  details (thin straw) (None)  Pharyngeal - Thin Syringe (None)  Penetration/Aspiration details (thin syringe') (None)  Pharyngeal - Puree (None)  Penetration/Aspiration details (puree) (None)  Pharyngeal - Mechanical Soft (None)  Penetration/Aspiration details (mechanical soft) (None)  Pharyngeal - Regular (None)  Penetration/Aspiration details (regular) (None)  Pharyngeal - Multi-consistency (None)  Penetration/Aspiration details (multi-consistency) (None)  Pharyngeal - Pill (None)  Penetration/Aspiration details (pill) (None)  Pharyngeal Comment (None)            Page, Selinda Orion 12/16/2014, 11:51 AM    Results for orders placed or performed during the hospital encounter of 11/25/14 (from the past 72 hour(s))  Glucose, capillary     Status: None   Collection Time: 12/15/14 11:43 AM  Result Value Ref Range   Glucose-Capillary 78 65 - 99 mg/dL   Comment  1 Notify RN   CBC with Differential/Platelet     Status: Abnormal   Collection Time: 12/15/14  4:53 PM  Result Value Ref Range   WBC 9.5 4.0 - 10.5 K/uL   RBC 3.50 (L) 3.87 - 5.11 MIL/uL   Hemoglobin 9.4 (L) 12.0 - 15.0 g/dL   HCT 30.5 (L) 36.0 - 46.0 %   MCV 87.1 78.0 - 100.0 fL   MCH 26.9 26.0 - 34.0 pg   MCHC 30.8 30.0 - 36.0 g/dL   RDW 19.2 (H) 11.5 - 15.5 %   Platelets 401 (H) 150 - 400 K/uL   Neutrophils Relative % 62 43 - 77 %   Lymphocytes Relative 21 12 - 46 %   Monocytes Relative 8 3 - 12 %   Eosinophils Relative 9 (H) 0 - 5 %   Basophils Relative 0 0 - 1 %   Neutro Abs 5.8 1.7 - 7.7 K/uL   Lymphs Abs 2.0 0.7 - 4.0 K/uL   Monocytes Absolute 0.8 0.1 - 1.0 K/uL   Eosinophils Absolute 0.9 (H) 0.0 - 0.7 K/uL   Basophils Absolute 0.0 0.0 - 0.1 K/uL   RBC Morphology POLYCHROMASIA PRESENT   Renal function panel     Status: Abnormal   Collection Time: 12/15/14  4:54 PM  Result Value Ref Range   Sodium 133 (L) 135 - 145 mmol/L   Potassium 4.2 3.5 - 5.1 mmol/L   Chloride 90 (L) 101 - 111 mmol/L   CO2 29 22 - 32 mmol/L   Glucose, Bld 100 (H) 65 - 99 mg/dL   BUN 82 (H) 6 - 20 mg/dL   Creatinine, Ser 7.60 (H) 0.44 - 1.00 mg/dL   Calcium 9.7 8.9 - 10.3 mg/dL   Phosphorus 3.3 2.5 - 4.6 mg/dL   Albumin 2.8 (L) 3.5 - 5.0 g/dL   GFR calc non Af Amer 5 (L) >60 mL/min   GFR calc Af Amer 6 (L) >60 mL/min    Comment: (NOTE) The eGFR has been calculated using the CKD EPI equation. This calculation has not been validated in all clinical situations. eGFR's persistently <60 mL/min signify possible Chronic Kidney Disease.    Anion gap 14 5 - 15  Ferritin     Status: Abnormal   Collection Time: 12/15/14  6:15 PM  Result Value Ref Range   Ferritin 679 (H) 11 - 307 ng/mL  Iron and TIBC     Status: None   Collection Time: 12/15/14  6:15 PM  Result Value Ref Range   Iron 34 28 - 170 ug/dL   TIBC 315 250 - 450 ug/dL   Saturation Ratios 11 10.4 - 31.8 %  UIBC 281 ug/dL  Glucose,  capillary     Status: Abnormal   Collection Time: 12/16/14 12:57 AM  Result Value Ref Range   Glucose-Capillary 127 (H) 65 - 99 mg/dL  Glucose, capillary     Status: Abnormal   Collection Time: 12/16/14  6:10 AM  Result Value Ref Range   Glucose-Capillary 126 (H) 65 - 99 mg/dL  Glucose, capillary     Status: None   Collection Time: 12/16/14 12:07 PM  Result Value Ref Range   Glucose-Capillary 88 65 - 99 mg/dL   Comment 1 Notify RN   Glucose, capillary     Status: None   Collection Time: 12/16/14  4:24 PM  Result Value Ref Range   Glucose-Capillary 67 65 - 99 mg/dL   Comment 1 Notify RN   Glucose, capillary     Status: None   Collection Time: 12/16/14  5:02 PM  Result Value Ref Range   Glucose-Capillary 77 65 - 99 mg/dL  Glucose, capillary     Status: Abnormal   Collection Time: 12/17/14 12:26 AM  Result Value Ref Range   Glucose-Capillary 117 (H) 65 - 99 mg/dL  Glucose, capillary     Status: Abnormal   Collection Time: 12/17/14  6:09 AM  Result Value Ref Range   Glucose-Capillary 104 (H) 65 - 99 mg/dL  Glucose, capillary     Status: None   Collection Time: 12/17/14 12:03 PM  Result Value Ref Range   Glucose-Capillary 79 65 - 99 mg/dL   Comment 1 Notify RN   Renal function panel     Status: Abnormal   Collection Time: 12/17/14  1:37 PM  Result Value Ref Range   Sodium 137 135 - 145 mmol/L   Potassium 4.4 3.5 - 5.1 mmol/L   Chloride 94 (L) 101 - 111 mmol/L   CO2 27 22 - 32 mmol/L   Glucose, Bld 81 65 - 99 mg/dL   BUN 59 (H) 6 - 20 mg/dL   Creatinine, Ser 6.16 (H) 0.44 - 1.00 mg/dL   Calcium 10.2 8.9 - 10.3 mg/dL   Phosphorus 3.5 2.5 - 4.6 mg/dL   Albumin 3.4 (L) 3.5 - 5.0 g/dL   GFR calc non Af Amer 7 (L) >60 mL/min   GFR calc Af Amer 8 (L) >60 mL/min    Comment: (NOTE) The eGFR has been calculated using the CKD EPI equation. This calculation has not been validated in all clinical situations. eGFR's persistently <60 mL/min signify possible Chronic Kidney Disease.     Anion gap 16 (H) 5 - 15  CBC     Status: Abnormal   Collection Time: 12/17/14  1:37 PM  Result Value Ref Range   WBC 8.8 4.0 - 10.5 K/uL   RBC 4.11 3.87 - 5.11 MIL/uL   Hemoglobin 11.3 (L) 12.0 - 15.0 g/dL   HCT 36.2 36.0 - 46.0 %   MCV 88.1 78.0 - 100.0 fL   MCH 27.5 26.0 - 34.0 pg   MCHC 31.2 30.0 - 36.0 g/dL   RDW 19.3 (H) 11.5 - 15.5 %   Platelets 353 150 - 400 K/uL  Hepatitis B surface antigen     Status: None   Collection Time: 12/17/14  2:38 PM  Result Value Ref Range   Hepatitis B Surface Ag Negative Negative    Comment: (NOTE) Performed At: [facility] [address] [place], [place] [id number] Lindon Romp MD [id number]   Glucose, capillary     Status: None  Collection Time: 12/17/14  7:18 PM  Result Value Ref Range   Glucose-Capillary 86 65 - 99 mg/dL  Glucose, capillary     Status: Abnormal   Collection Time: 12/18/14 12:12 AM  Result Value Ref Range   Glucose-Capillary 111 (H) 65 - 99 mg/dL   Comment 1 Notify RN   Glucose, capillary     Status: Abnormal   Collection Time: 12/18/14  6:01 AM  Result Value Ref Range   Glucose-Capillary 118 (H) 65 - 99 mg/dL   Comment 1 Notify RN      HEENT: poor oral movements   Cardio: RRR and no murmur, no pain to palp of chest wall Resp: CTA B/L.no use of accessory muscles, No wheezes and no upper airway sounds, not able to cough on command GI: BS positive and NT, ND, prior midline abd incision with extensive scarring, scab with old sutures attached Extremity:  Pulses positive and No Edema Skin:    G tube site non tender Neuro: Flat but very alert, Cranial Nerve Abnormalities bilateral central 7,Aphasic and Apraxic, nodding for Y/N questions,rubbing R fingers together Musc/Skel:  Other decrease ROM in BIlateral knees and Right shoulder and elbow due to tone, no pain with UE or LE ROM Gen NAD   Assessment/Plan: 1. Functional deficits secondary to acute Left MCA infarct superimposed on subacute R ACA  infarct with bilateral hemiparesis, severe dysphagia and severe aphasia which require 3+ hours per day of interdisciplinary therapy in a comprehensive inpatient rehab setting. Physiatrist is providing close team supervision and 24 hour management of active medical problems listed below. Physiatrist and rehab team continue to assess barriers to discharge/monitor patient progress toward functional and medical goals.   FIM: Function - Bathing Position: Wheelchair/chair at sink Body parts bathed by patient: Chest, Right upper leg, Left upper leg Body parts bathed by helper: Right arm, Left arm, Abdomen, Front perineal area, Buttocks, Back Bathing not applicable: Right lower leg, Left lower leg Assist Level: Touching or steadying assistance(Pt > 75%)  Function- Upper Body Dressing/Undressing What is the patient wearing?: Pull over shirt/dress Bra - Perfomed by patient: Thread/unthread right bra strap, Thread/unthread left bra strap Bra - Perfomed by helper: Hook/unhook bra (pull down sports bra) Pull over shirt/dress - Perfomed by patient: Thread/unthread left sleeve, Thread/unthread right sleeve, Pull shirt over trunk Pull over shirt/dress - Perfomed by helper: Put head through opening Assist Level: Touching or steadying assistance(Pt > 75%) Set up : To obtain clothing/put away Function - Lower Body Dressing/Undressing What is the patient wearing?: Pants Position: Wheelchair/chair at sink Pants- Performed by helper: Thread/unthread right pants leg, Thread/unthread left pants leg, Pull pants up/down Non-skid slipper socks- Performed by helper: Don/doff right sock, Don/doff left sock Assist Level: Touching or steadying assistance (Pt > 75%)  Function - Toileting Toileting activity did not occur: No continent bowel/bladder event Toileting steps completed by helper: Adjust clothing prior to toileting, Performs perineal hygiene, Adjust clothing after toileting Toileting Assistive Devices: Grab  bar or rail Assist level: Touching or steadying assistance (Pt.75%)  Function - Air cabin crew transfer activity did not occur: Safety/medical concerns Toilet transfer assistive device: Elevated toilet seat/BSC over toilet Assist level to toilet: Touching or steadying assistance (Pt > 75%) Assist level from toilet: Touching or steadying assistance (Pt > 75%)  Function - Chair/bed transfer Chair/bed transfer method: Stand pivot Chair/bed transfer assist level: Touching or steadying assistance (Pt > 75%) Chair/bed transfer assistive device: Armrests Chair/bed transfer details: Verbal cues for sequencing, Tactile cues for  sequencing, Tactile cues for posture, Tactile cues for weight shifting  Function - Locomotion: Wheelchair Will patient use wheelchair at discharge?: Yes Wheelchair activity did not occur:  (Attempted hand-over-hand facilitation for w/c propulsion, visual demonstration, however no pt initiation evident) Assist Level: Dependent (Pt equals 0%) Function - Locomotion: Ambulation Ambulation activity did not occur: Safety/medical concerns (Attempted ambulation, however upon standing x3 reps pt unable to step (unable to determine if due to \apraxia or pt fearful)) Assistive device: Other (comment) (rollator) Max distance: 150 Assist level: Touching or steadying assistance (Pt > 75%) Walk 10 feet activity did not occur: Safety/medical concerns Assist level: Touching or steadying assistance (Pt > 75%) Walk 50 feet with 2 turns activity did not occur: Safety/medical concerns Assist level: Touching or steadying assistance (Pt > 75%) Walk 150 feet activity did not occur: Safety/medical concerns Assist level: Touching or steadying assistance (Pt > 75%) Walk 10 feet on uneven surfaces activity did not occur: Safety/medical concerns  Function - Comprehension Comprehension: Auditory Comprehension assist level: Understands basic 50 - 74% of the time/ requires cueing 25 - 49%  of the time  Function - Expression Expression: Nonverbal Expression assistive device: Other (Comment) Expression assist level: Expresses basis less than 25% of the time/requires cueing >75% of the time.  Function - Social Interaction Social Interaction assist level: Interacts appropriately 25 - 49% of time - Needs frequent redirection.  Function - Problem Solving Problem solving assist level: Solves basic 25 - 49% of the time - needs direction more than half the time to initiate, plan or complete simple activities  Function - Memory Memory assist level: Recognizes or recalls 25 - 49% of the time/requires cueing 50 - 75% of the time Patient normally able to recall (first 3 days only): None of the above  Medical Problem List and Plan: 1. Functional deficits secondary to left MCA infarct/respiratory failure. Right hemiparesis improving, now using Y/N nods, Alertness increasing with ritalin-some evidence of excess motor activity but no tachycardia 2. DVT Prophylaxis/Anticoagulation: SCDs. Monitor for any signs of DVT. 8/12 venous dopplers neg, mobility increasing so risk is as well 3. Pain Management: Tylenol as needed, no apparent pain during PT 4. Dysphagia. Patient is nothing by mouth. Status post jejunostomy tube 08/12/2014 per interventional radiology for nutritional support. poor prognosis for improvement given severe bilateral deficits, do not anticipate d/c J tube during this admission,  5. Neuropsych: This patient is not capable of making decisions on her own behalf. 6. Skin/Wound Care: Routine skin checks. J tube site maintenance. continue turning and nutrition, site looks good 7. Fluids/Electrolytes/Nutrition: Routine I&O. Adjust TF based on nutritional needs. Renal adjusting dialysis, complicated situation, Not a diabetic, getting CBGs because of TF, these have been normal, Dietary writing TF orders, now on nocturnal tube feeds only If pt passes swallow  Will likely be mainly  therapeutic feeds at first, took 60% of one meal and 25% of the other yesterday , no breakfast this am, reduce feeding rate if po becomes more consistent 8. End-stage renal disease with hemodialysis. Follow-up per renal services. Hemodialysis in PM after therapies to avoid conflict, Nepro for nutrition, hypoalbuminemia related to ESRD 9. Seizure prophylaxis. Keppra 500 mg twice a day, monitor drug toxicity. No seizures, Ritalin can decrease seizure threshold however no evidence of this thus far 10. Hypothyroidism. Synthroid 11. Chronic anemia. Aranesp weekly with dialysis, hgb monitored in HD, nephro managing, last Hgb 11 2gm increase from prior value 12. History of asthma. Schedule nebulizers given aphasia. , no wheezing  today, no volitional cough, secretions improving 13. Mood/anxiety. Continue Xanax on dialysis days 14.  Hx of recurrent resp failure, high risk for PNA given comorbid conditions, Comfortable. Secretions better. Afebrile. No distress.Now trialing po, monitor for aspiration    -wean oxygen down to keep sats >90%, rec IS, requiring only 2 L in PT rather than 3 L 15.  Bowel incont- monitor skin, several loose BMs 16.  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

## 2014-12-18 NOTE — Progress Notes (Signed)
Assessment/Plan: 1. 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 ESRD -TTS on HD  Subjective: Interval History: No articulation  Objective: Vital signs in last 24 hours: Temp:  [98.2 F (36.8 C)-98.3 F (36.8 C)] 98.2 F (36.8 C) (09/16 0610) Pulse Rate:  [85-125] 115 (09/16 0820) Resp:  [18-24] 24 (09/16 0820) BP: (75-145)/(40-86) 96/72 mmHg (09/16 0610) SpO2:  [91 %-100 %] 91 % (09/16 0820) Weight:  [69.8 kg (153 lb 14.1 oz)-71.3 kg (157 lb 3 oz)] 71.3 kg (157 lb 3 oz) (09/16 0646) Weight change: -1.4 kg (-3 lb 1.4 oz)  Intake/Output from previous day: 09/15 0701 - 09/16 0700 In: 90 [P.O.:90] Out: 1000  Intake/Output this shift: Total I/O In: 30 [P.O.:30] Out: -   General appearance: alert and follows commands Chest wall: no tenderness Cardio: regular rate and rhythm, S1, S2 normal, no murmur, click, rub or gallop Extremities: edema tr edema  Lab Results:  Recent Labs  12/15/14 1653 12/17/14 1337  WBC 9.5 8.8  HGB 9.4* 11.3*  HCT 30.5* 36.2  PLT 401* 353   BMET:  Recent Labs  12/15/14 1654 12/17/14 1337  NA 133* 137  K 4.2 4.4  CL 90* 94*  CO2 29 27  GLUCOSE 100* 81  BUN 82* 59*  CREATININE 7.60* 6.16*  CALCIUM 9.7 10.2   No results for input(s): PTH in the last 72 hours. Iron Studies:  Recent Labs  12/15/14 1815  IRON 34  TIBC 315  FERRITIN 679*   Studies/Results: No results found.  Scheduled: . ALPRAZolam  0.5 mg Per Tube Q T,Th,Sa-HD  . antiseptic oral rinse  7 mL Mouth Rinse TID  . aspirin  325 mg Oral Daily  . budesonide  0.25 mg Nebulization BID  . chlorhexidine  15 mL Mouth/Throat BID  . [START ON 12/24/2014] darbepoetin (ARANESP) injection - DIALYSIS  100 mcg Intravenous Q Thu-HD  . ferric gluconate (FERRLECIT/NULECIT) IV  125 mg Intravenous Q T,Th,Sa-HD  . free water  50 mL Per Tube TID  . guaifenesin  200 mg Per Tube TID  . ipratropium-albuterol  3 mL Nebulization TID  . lanolin   Topical BID  . levETIRAcetam  500 mg  Oral BID  . levothyroxine  25 mcg Oral QAC breakfast  . methylphenidate  10 mg Per Tube BID WC  . mirtazapine  7.5 mg Per Tube QHS  . multivitamin  1 tablet Oral QHS  . pantoprazole sodium  40 mg Per Tube Q1200     LOS: 23 days   Drennen,Avanna Sowder C 12/18/2014,11:06 AM

## 2014-12-18 NOTE — Progress Notes (Signed)
Speech Language Pathology Daily Session Note  Patient Details  Name: [name] MRN: [id number] Date of Birth: 12-01-1955  Today's Date: 12/18/2014 SLP Individual Time: 1300-1400 SLP Individual Time Calculation (min): 60 min  Short Term Goals: Week 4: SLP Short Term Goal 1 (Week 4): Pt will improve management of her secretions as evidenced by ability to orally expectorate secretions and anteriorly contain her saliva with max assist multimodal cues.   SLP Short Term Goal 2 (Week 4): Pt will consume dys 1 textures and honey thick liquids with mod multimodal cues for use of swallowing precautions and minimal overt s/s  of aspiration.   SLP Short Term Goal 3 (Week 4): Pt will initiate a basic,familiar task with mod assist verbal cues over 75% of observable opportunities.  SLP Short Term Goal 4 (Week 4): Pt will visually scan to midline in >75% of observable opportunities during basic, functional tasks with mod verbal cues.  SLP Short Term Goal 5 (Week 4): Pt will vocalize to produce vowels in isolation with max assist multimodal cues.   SLP Short Term Goal 6 (Week 4): Pt will communicate basic wants and needs via multimodal communication with Mod assist multimodal cues  Skilled Therapeutic Interventions: Skilled treatment session focused on addressing speech-language and swallow goals. SLP facilitated session by providing set-up honey-thick liquids, patient initally declined, but following SLP re-education on rationale for thickened liquids patient agreeable.  Patient self-feed liquids via teaspoon with intermittent spontaneous second swallow, required overall Mod assist for consistent use.  Patient also able to orally expectorate secretions x1 independently during session!  Despite Max multimodal cues no true vowels were produced; however, patient was able to hum during songs without keeping melody with a slow rate and Max assist multimodal cues.  Continue with current plan of care.     Function:  Eating Eating   Modified Consistency Diet: Yes Eating Assist Level: Help with picking up utensils;Set up assist for;Supervision or verbal cues           Cognition Comprehension Comprehension assist level: Understands basic 50 - 74% of the time/ requires cueing 25 - 49% of the time  Expression   Expression assist level: Expresses basis less than 25% of the time/requires cueing >75% of the time.  Social Interaction Social Interaction assist level: Interacts appropriately 25 - 49% of time - Needs frequent redirection.  Problem Solving Problem solving assist level: Solves basic 25 - 49% of the time - needs direction more than half the time to initiate, plan or complete simple activities  Memory Memory assist level: Recognizes or recalls 25 - 49% of the time/requires cueing 50 - 75% of the time    Pain Pain Assessment Pain Assessment: No/denies pain Pain Score: 0-No pain  Therapy/Group: Individual Therapy  Charlane Ferretti., CCC-SLP [phone]  [name] 12/18/2014, 2:43 PM

## 2014-12-18 NOTE — Progress Notes (Addendum)
Hypoglycemic Event  CBG: 62  Treatment: 15 GM carbohydrate snack  Symptoms: None  Follow-up CBG: Time:1220 CBG Result:44  Possible Reasons for Event: Inadequate meal intake  Comments/MD notified:D. Angiulli, PA He stated that the patient does this often and will rebound to a hyperglycemic state. Watch patient for any symptoms of hypoglycemia.  At 1235 patient was given 1 tube of glutose via the PEG tube.  At 1305 she refused to allow another CBG, but indicated that she was feeling better.  Kieth Brightly E  Remember to initiate Hypoglycemia Order Set & complete

## 2014-12-18 NOTE — Progress Notes (Signed)
Occupational Therapy Weekly Progress Note  Patient Details  Name: [name] MRN: [id number] Date of Birth: Apr 02, 1956  Beginning of progress report period: December 10, 2014 End of progress report period: December 18, 2014  Today's Date: 12/18/2014 OT Individual Time: 1030-1130 OT Individual Time Calculation (min): 60 min    Patient has met 3 of 4 short term goals. Pt is making steady progress towards goals.  Pt is demonstrating increased initiation with functional tasks and transfers, is communicating with yes/no head nods and gestures.  Pt is currently a min/steady assist with stand pivot transfers and mod-min sit > stand depending on height of surface.  Due to progress made in past week have extended pt's stay to allow pt to reach higher level and further decrease burden of care.  Patient continues to demonstrate the following deficits: Rt inattention, decreased initiation, motor apraxia, decreased attention to task, RUE weakness, impaired dynamic sitting and standing balance and therefore will continue to benefit from skilled OT intervention to enhance overall performance with BADL and Reduce care partner burden.  Patient progressing toward long term goals..  Continue plan of care.  OT Short Term Goals Week 3:  OT Short Term Goal 1 (Week 3): Pt will perform UB bathing with mod assist sitting supported. OT Short Term Goal 1 - Progress (Week 3): Met OT Short Term Goal 2 (Week 3): Pt will complete UB dressing with mod assist  OT Short Term Goal 2 - Progress (Week 3): Met OT Short Term Goal 3 (Week 3): Pt will complete LB dressing with max assist at sit > stand level OT Short Term Goal 3 - Progress (Week 3): Progressing toward goal OT Short Term Goal 4 (Week 3): Pt will perform toilet transfer stand pivot with no more than mod assist.  OT Short Term Goal 4 - Progress (Week 3): Met Week 4:  OT Short Term Goal 1 (Week 4): STG = LTGs due to remaining LOS  Skilled Therapeutic  Interventions/Progress Updates:    ADL retraining with focus on initiation, sequencing, sit <> stand, standing tolerance, and functional use of RUE.  Pt completed toilet transfer with min/steady assist to toilet and supervision to return to w/c.  Increased focus on assist with LB clothing management and hygiene with pt able to adjust brief prior to toileting and assisted with pulling pants over hips.  Pt continues to demonstrate apraxia and fearfulness with LB dressing. Did complete UB bathing and dressing with only verbal cues to initiate and for thoroughness.  Sit > stand supervision throughout session with intermittent steady assist during standing depending on level of challenge in standing.  Pt demonstrates improved use of RUE during bathing and dressing, however with table top tasks requires encouragement to attempt.  Utilized resistive pegs with focus on removing pegs from holes with Rt hand, with pt often shaking her head despite ability to complete task (at a slower rate than Lt hand).  Pt would require cues to release first peg before moving on to next peg, continued difficulty terminating task and releasing grasp.  Therapy Documentation Precautions:  Precautions Precautions: Fall Precaution Comments: fall, NPO, pusher to the right at times, non-verbal,  Restrictions Weight Bearing Restrictions: No General:   Vital Signs: Therapy Vitals Temp: 98.2 F (36.8 C) Temp Source: Oral Pulse Rate: (!) 110 Resp: 18 BP: 96/72 mmHg Patient Position (if appropriate): Lying Oxygen Therapy SpO2: 99 % O2 Device: Nasal Cannula O2 Flow Rate (L/min): 2 L/min Pain:  Pt with no c/o pain  See Function Navigator for Current Functional Status.   Therapy/Group: Individual Therapy  Simonne Come 12/18/2014, 7:40 AM

## 2014-12-19 ENCOUNTER — Inpatient Hospital Stay (HOSPITAL_COMMUNITY): Payer: Medicaid Other | Admitting: Physical Therapy

## 2014-12-19 ENCOUNTER — Inpatient Hospital Stay (HOSPITAL_COMMUNITY): Payer: Medicaid Other | Admitting: Speech Pathology

## 2014-12-19 LAB — CBC WITH DIFFERENTIAL/PLATELET
BASOS ABS: 0 10*3/uL (ref 0.0–0.1)
Basophils Relative: 0 %
EOS PCT: 13 %
Eosinophils Absolute: 1.1 10*3/uL — ABNORMAL HIGH (ref 0.0–0.7)
HEMATOCRIT: 31.1 % — AB (ref 36.0–46.0)
Hemoglobin: 9.6 g/dL — ABNORMAL LOW (ref 12.0–15.0)
LYMPHS PCT: 23 %
Lymphs Abs: 1.8 10*3/uL (ref 0.7–4.0)
MCH: 26.8 pg (ref 26.0–34.0)
MCHC: 30.9 g/dL (ref 30.0–36.0)
MCV: 86.9 fL (ref 78.0–100.0)
MONO ABS: 0.9 10*3/uL (ref 0.1–1.0)
MONOS PCT: 11 %
NEUTROS ABS: 4.2 10*3/uL (ref 1.7–7.7)
Neutrophils Relative %: 52 %
PLATELETS: 317 10*3/uL (ref 150–400)
RBC: 3.58 MIL/uL — ABNORMAL LOW (ref 3.87–5.11)
RDW: 18.9 % — AB (ref 11.5–15.5)
WBC: 8 10*3/uL (ref 4.0–10.5)

## 2014-12-19 LAB — GLUCOSE, CAPILLARY
GLUCOSE-CAPILLARY: 109 mg/dL — AB (ref 65–99)
GLUCOSE-CAPILLARY: 84 mg/dL (ref 65–99)
GLUCOSE-CAPILLARY: 81 mg/dL (ref 65–99)
Glucose-Capillary: 58 mg/dL — ABNORMAL LOW (ref 65–99)

## 2014-12-19 NOTE — Plan of Care (Signed)
Problem: RH SKIN INTEGRITY Goal: RH STG MAINTAIN SKIN INTEGRITY WITH ASSISTANCE STG Maintain Skin Integrity With Mod. Assistance.  Outcome: Not Progressing Patient has no initiation for pressure relief, turning provided by staff.  Problem: RH SAFETY Goal: RH STG DEMO UNDERSTANDING HOME SAFETY PRECAUTIONS Pt/family will verbalize understanding of home safety precautions.  Outcome: Not Progressing Patient unable to verbalize, non verbal.

## 2014-12-19 NOTE — Progress Notes (Signed)
Speech Language Pathology Daily Session Note  Patient Details  Name: [name] MRN: [id number] Date of Birth: Feb 15, 1956  Today's Date: 12/19/2014 SLP Individual Time: 4098-1191 SLP Individual Time Calculation (min): 30 min  Short Term Goals: Week 4: SLP Short Term Goal 1 (Week 4): Pt will improve management of her secretions as evidenced by ability to orally expectorate secretions and anteriorly contain her saliva with max assist multimodal cues.   SLP Short Term Goal 2 (Week 4): Pt will consume dys 1 textures and honey thick liquids with mod multimodal cues for use of swallowing precautions and minimal overt s/s  of aspiration.   SLP Short Term Goal 3 (Week 4): Pt will initiate a basic,familiar task with mod assist verbal cues over 75% of observable opportunities.  SLP Short Term Goal 4 (Week 4): Pt will visually scan to midline in >75% of observable opportunities during basic, functional tasks with mod verbal cues.  SLP Short Term Goal 5 (Week 4): Pt will vocalize to produce vowels in isolation with max assist multimodal cues.   SLP Short Term Goal 6 (Week 4): Pt will communicate basic wants and needs via multimodal communication with Mod assist multimodal cues  Skilled Therapeutic Interventions: Skilled treatment focused on dysphagia goals.  SLP facilitated session by providing min-mod A for patient to follow swallow strategies (i.e. taking additional swallows after every bite, small bites, and slow rate of intake).  She ate approximately 10% of lunch tray before she refused po intake.  Liquids were offered multiple times, but patient politely refused.   She demonstrated a strong productive cough x3 during the meal.  Patient displayed overt frustration with SLP's frequent verbal prompts; therefore, therapist educated patient on the importance of implementing swallowing strategies for her to safely tolerate her current diet.  Continue with current plan of care.    Function:  Eating Eating Eating activity did not occur: Safety/medical concerns Modified Consistency Diet: Yes Eating Assist Level: Supervision or verbal cues;Help with picking up utensils           Cognition Comprehension Comprehension assist level: Understands basic 50 - 74% of the time/ requires cueing 25 - 49% of the time  Expression   Expression assist level: Expresses basis less than 25% of the time/requires cueing >75% of the time.  Social Interaction Social Interaction assist level: Interacts appropriately 25 - 49% of time - Needs frequent redirection.  Problem Solving Problem solving assist level: Solves basic 25 - 49% of the time - needs direction more than half the time to initiate, plan or complete simple activities  Memory Memory assist level: Recognizes or recalls 25 - 49% of the time/requires cueing 50 - 75% of the time    Pain Pain Assessment Pain Assessment: No/denies pain  Therapy/Group: Individual Therapy  Cranford Mon, M.A. CCC-SLP Cranford Mon A 12/19/2014, 2:49 PM

## 2014-12-19 NOTE — Progress Notes (Signed)
Subjective/Complaints: Using Y/N nods  No vocalization yet  ROS cannot obtain due to expressive aphasia   Objective: Vital Signs: Blood pressure 108/73, pulse 105, temperature 97.8 F (36.6 C), temperature source Oral, resp. rate 18, weight 68.9 kg (151 lb 14.4 oz), SpO2 100 %. No results found. Results for orders placed or performed during the hospital encounter of 11/25/14 (from the past 72 hour(s))  Glucose, capillary     Status: None   Collection Time: 12/16/14 12:07 PM  Result Value Ref Range   Glucose-Capillary 88 65 - 99 mg/dL   Comment 1 Notify RN   Glucose, capillary     Status: None   Collection Time: 12/16/14  4:24 PM  Result Value Ref Range   Glucose-Capillary 67 65 - 99 mg/dL   Comment 1 Notify RN   Glucose, capillary     Status: None   Collection Time: 12/16/14  5:02 PM  Result Value Ref Range   Glucose-Capillary 77 65 - 99 mg/dL  Glucose, capillary     Status: Abnormal   Collection Time: 12/17/14 12:26 AM  Result Value Ref Range   Glucose-Capillary 117 (H) 65 - 99 mg/dL  Glucose, capillary     Status: Abnormal   Collection Time: 12/17/14  6:09 AM  Result Value Ref Range   Glucose-Capillary 104 (H) 65 - 99 mg/dL  Glucose, capillary     Status: None   Collection Time: 12/17/14 12:03 PM  Result Value Ref Range   Glucose-Capillary 79 65 - 99 mg/dL   Comment 1 Notify RN   Renal function panel     Status: Abnormal   Collection Time: 12/17/14  1:37 PM  Result Value Ref Range   Sodium 137 135 - 145 mmol/L   Potassium 4.4 3.5 - 5.1 mmol/L   Chloride 94 (L) 101 - 111 mmol/L   CO2 27 22 - 32 mmol/L   Glucose, Bld 81 65 - 99 mg/dL   BUN 59 (H) 6 - 20 mg/dL   Creatinine, Ser 6.16 (H) 0.44 - 1.00 mg/dL   Calcium 10.2 8.9 - 10.3 mg/dL   Phosphorus 3.5 2.5 - 4.6 mg/dL   Albumin 3.4 (L) 3.5 - 5.0 g/dL   GFR calc non Af Amer 7 (L) >60 mL/min   GFR calc Af Amer 8 (L) >60 mL/min    Comment: (NOTE) The eGFR has been calculated using the CKD EPI equation. This  calculation has not been validated in all clinical situations. eGFR's persistently <60 mL/min signify possible Chronic Kidney Disease.    Anion gap 16 (H) 5 - 15  CBC     Status: Abnormal   Collection Time: 12/17/14  1:37 PM  Result Value Ref Range   WBC 8.8 4.0 - 10.5 K/uL   RBC 4.11 3.87 - 5.11 MIL/uL   Hemoglobin 11.3 (L) 12.0 - 15.0 g/dL   HCT 36.2 36.0 - 46.0 %   MCV 88.1 78.0 - 100.0 fL   MCH 27.5 26.0 - 34.0 pg   MCHC 31.2 30.0 - 36.0 g/dL   RDW 19.3 (H) 11.5 - 15.5 %   Platelets 353 150 - 400 K/uL  Hepatitis B surface antigen     Status: None   Collection Time: 12/17/14  2:38 PM  Result Value Ref Range   Hepatitis B Surface Ag Negative Negative    Comment: (NOTE) Performed At: [facility] [address] [place], [place] [id number] Lindon Romp MD PP:[phone]   Glucose, capillary     Status:  None   Collection Time: 12/17/14  7:18 PM  Result Value Ref Range   Glucose-Capillary 86 65 - 99 mg/dL  Glucose, capillary     Status: Abnormal   Collection Time: 12/18/14 12:12 AM  Result Value Ref Range   Glucose-Capillary 111 (H) 65 - 99 mg/dL   Comment 1 Notify RN   Glucose, capillary     Status: Abnormal   Collection Time: 12/18/14  6:01 AM  Result Value Ref Range   Glucose-Capillary 118 (H) 65 - 99 mg/dL   Comment 1 Notify RN   Glucose, capillary     Status: Abnormal   Collection Time: 12/18/14 11:52 AM  Result Value Ref Range   Glucose-Capillary 61 (L) 65 - 99 mg/dL   Comment 1 Notify RN   Glucose, capillary     Status: Abnormal   Collection Time: 12/18/14 12:18 PM  Result Value Ref Range   Glucose-Capillary 44 (LL) 65 - 99 mg/dL   Comment 1 Notify RN   Glucose, capillary     Status: None   Collection Time: 12/18/14  7:03 PM  Result Value Ref Range   Glucose-Capillary 90 65 - 99 mg/dL   Comment 1 Notify RN   Glucose, capillary     Status: Abnormal   Collection Time: 12/18/14 11:46 PM  Result Value Ref Range   Glucose-Capillary 101 (H) 65 - 99  mg/dL   Comment 1 Notify RN   Glucose, capillary     Status: Abnormal   Collection Time: 12/19/14  6:01 AM  Result Value Ref Range   Glucose-Capillary 109 (H) 65 - 99 mg/dL   Comment 1 Notify RN      HEENT: poor oral movements   Cardio: RRR and no murmur, no pain to palp of chest wall Resp: CTA B/L.no use of accessory muscles, No wheezes and no upper airway sounds, not able to cough on command GI: BS positive and NT, ND, prior midline abd incision with extensive scarring, scab with old sutures attached Extremity:  Pulses positive and No Edema Skin:    G tube site non tender Neuro: Flat but very alert, Cranial Nerve Abnormalities bilateral central 7,Aphasic and Apraxic, nodding for Y/N questions,rubbing R fingers together Musc/Skel:  Other decrease ROM in BIlateral knees and Right shoulder and elbow due to tone, no pain with UE or LE ROM Gen NAD   Assessment/Plan: 1. 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 Physiatrist is providing close team supervision and 24 hour management of active medical problems listed below. Physiatrist and rehab team continue to assess barriers to discharge/monitor patient progress toward functional and medical goals.   FIM: Function - Bathing Position: Wheelchair/chair at sink Body parts bathed by patient: Right arm, Left arm, Chest, Abdomen, Right upper leg, Left upper leg Body parts bathed by helper: Front perineal area, Buttocks, Back Bathing not applicable: Right lower leg, Left lower leg Assist Level: Touching or steadying assistance(Pt > 75%)  Function- Upper Body Dressing/Undressing What is the patient wearing?: Pull over shirt/dress Bra - Perfomed by patient: Thread/unthread right bra strap, Thread/unthread left bra strap Bra -  Perfomed by helper: Hook/unhook bra (pull down sports bra) Pull over shirt/dress - Perfomed by patient: Thread/unthread left sleeve, Thread/unthread right sleeve, Pull shirt over trunk, Put head through opening Pull over shirt/dress - Perfomed by helper: Put  head through opening Assist Level: Touching or steadying assistance(Pt > 75%) Set up : To obtain clothing/put away Function - Lower Body Dressing/Undressing What is the patient wearing?: Pants, Non-skid slipper socks Position: Wheelchair/chair at sink Pants- Performed by helper: Thread/unthread right pants leg, Thread/unthread left pants leg, Pull pants up/down Non-skid slipper socks- Performed by helper: Don/doff right sock, Don/doff left sock Assist Level: Touching or steadying assistance (Pt > 75%)  Function - Toileting Toileting activity did not occur: No continent bowel/bladder event Toileting steps completed by helper: Adjust clothing prior to toileting, Performs perineal hygiene, Adjust clothing after toileting Toileting Assistive Devices: Grab bar or rail Assist level: Touching or steadying assistance (Pt.75%)  Function - Air cabin crew transfer activity did not occur: Safety/medical concerns Toilet transfer assistive device: Elevated toilet seat/BSC over toilet Assist level to toilet: Touching or steadying assistance (Pt > 75%) Assist level from toilet: Supervision or verbal cues  Function - Chair/bed transfer Chair/bed transfer method: Stand pivot Chair/bed transfer assist level: Supervision or verbal cues Chair/bed transfer assistive device: Bedrails Chair/bed transfer details: Verbal cues for sequencing, Tactile cues for sequencing, Tactile cues for posture, Tactile cues for weight shifting  Function - Locomotion: Wheelchair Will patient use wheelchair at discharge?: Yes Wheelchair activity did not occur:  (Attempted hand-over-hand facilitation for w/c propulsion, visual demonstration, however no pt initiation  evident) Assist Level: Dependent (Pt equals 0%) Function - Locomotion: Ambulation Ambulation activity did not occur: Safety/medical concerns (Attempted ambulation, however upon standing x3 reps pt unable to step (unable to determine if due to$Remove' \\apraxia'[id number]$  or pt fearful)) Assistive device: Other (comment) (rollator) Max distance: 150 Assist level: Touching or steadying assistance (Pt > 75%) Walk 10 feet activity did not occur: Safety/medical concerns Assist level: Touching or steadying assistance (Pt > 75%) Walk 50 feet with 2 turns activity did not occur: Safety/medical concerns Assist level: Touching or steadying assistance (Pt > 75%) Walk 150 feet activity did not occur: Safety/medical concerns Assist level: Touching or steadying assistance (Pt > 75%) Walk 10 feet on uneven surfaces activity did not occur: Safety/medical concerns  Function - Comprehension Comprehension: Auditory Comprehension assist level: Understands basic 50 - 74% of the time/ requires cueing 25 - 49% of the time  Function - Expression Expression: Nonverbal Expression assistive device: Other (Comment) Expression assist level: Expresses basis less than 25% of the time/requires cueing >75% of the time.  Function - Social Interaction Social Interaction assist level: Interacts appropriately 25 - 49% of time - Needs frequent redirection.  Function - Problem Solving Problem solving assist level: Solves basic 25 - 49% of the time - needs direction more than half the time to initiate, plan or complete simple activities  Function - Memory Memory assist level: Recognizes or recalls 25 - 49% of the time/requires cueing 50 - 75% of the time Patient normally able to recall (first 3 days only): None of the above  Medical Problem List and Plan: 1. Functional deficits secondary to left MCA infarct/respiratory failure. Right hemiparesis improving, now using Y/N nods, Alertness increasing with ritalin-some evidence of excess motor  activity but no tachycardia 2. DVT Prophylaxis/Anticoagulation: SCDs. Monitor for any signs of DVT. 8/12 venous dopplers neg, mobility increasing so risk is as well 3. Pain Management: Tylenol as needed, no apparent pain during PT 4. Dysphagia. Patient is nothing by mouth. Status post jejunostomy tube 08/12/2014 per interventional radiology for nutritional support. poor prognosis for improvement given severe bilateral deficits, do not anticipate d/c J tube during this admission,  5.  Neuropsych: This patient is not capable of making decisions on her own behalf. 6. Skin/Wound Care: Routine skin checks. J tube site maintenance. continue turning and nutrition, site looks good 7. Fluids/Electrolytes/Nutrition: Routine I&O. Adjust TF based on nutritional needs. Renal adjusting dialysis, complicated situation, Not a diabetic, getting CBGs because of TF, these have been normal, Dietary writing TF orders, now on nocturnal tube feeds only If pt passes swallow  Will likely be mainly therapeutic feeds at first, ,minimal intake yesterday, reduce feeding rate if po becomes more consistent 8. End-stage renal disease with hemodialysis. Follow-up per renal services. Hemodialysis in PM after therapies to avoid conflict, Nepro for nutrition, hypoalbuminemia related to ESRD, pt nods that she has HD today which is correct 9. Seizure prophylaxis. Keppra 500 mg twice a day, monitor drug toxicity. No seizures, Ritalin can decrease seizure threshold however no evidence of this thus far 10. Hypothyroidism. Synthroid 11. Chronic anemia. Aranesp weekly with dialysis, hgb monitored in HD, nephro managing, last Hgb 11 2gm increase from prior value 12. History of asthma. Schedule nebulizers given aphasia. , no wheezing today, no volitional cough, secretions improving 13. Mood/anxiety. Continue Xanax on dialysis days 14.  Hx of recurrent resp failure, high risk for PNA given comorbid conditions, Comfortable. Secretions better.  Afebrile. No distress.Now trialing po, monitor for aspiration    -wean oxygen down to keep sats >90%, rec IS, requiring only 2 L in PT rather than 3 L 15.  Bowel incont- monitor skin,last BM was cont 16.  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

## 2014-12-20 ENCOUNTER — Inpatient Hospital Stay (HOSPITAL_COMMUNITY): Payer: Medicaid Other | Admitting: Physical Therapy

## 2014-12-20 ENCOUNTER — Inpatient Hospital Stay (HOSPITAL_COMMUNITY): Payer: Medicaid Other | Admitting: Occupational Therapy

## 2014-12-20 ENCOUNTER — Ambulatory Visit (HOSPITAL_COMMUNITY): Payer: Medicaid Other | Admitting: Occupational Therapy

## 2014-12-20 LAB — GLUCOSE, CAPILLARY
GLUCOSE-CAPILLARY: 118 mg/dL — AB (ref 65–99)
GLUCOSE-CAPILLARY: 85 mg/dL (ref 65–99)
Glucose-Capillary: 77 mg/dL (ref 65–99)

## 2014-12-20 MED ORDER — GUAIFENESIN 100 MG/5ML PO SOLN
200.0000 mg | Freq: Three times a day (TID) | ORAL | Status: DC
  Administered 2014-12-20 – 2014-12-23 (×10): 200 mg
  Filled 2014-12-20 (×4): qty 10
  Filled 2014-12-20: qty 20
  Filled 2014-12-20 (×10): qty 10

## 2014-12-20 NOTE — Progress Notes (Signed)
Occupational Therapy Session Note  Patient Details  Name: [name] MRN: [id number] Date of Birth: 07/29/1955  Today's Date: 12/20/2014 OT Individual Time: 4098-1191 OT Individual Time Calculation (min): 29 min    Short Term Goals: Week 4:  OT Short Term Goal 1 (Week 4): STG = LTGs due to remaining LOS  Skilled Therapeutic Interventions/Progress Updates:  Upon entering the room, pt supine in bed with RN present and providing medications. Pt with no s/o,signs, or symptoms of pain this session.Min A supine >sit.  Pt performed min A stand pivot transfer EOB> wheelchair. Pt pointing to sink for grooming tasks. Pt engaged in brushing her teeth with assistance. She washed face with set up A with encouraged use of R UE for all grooming tasks. Pt remained in wheelchair at end of session with call bell and all needed items within reach.   Therapy Documentation Precautions:  Precautions Precautions: Fall Precaution Comments: fall, NPO, pusher to the right at times, non-verbal,  Restrictions Weight Bearing Restrictions: No General:   Vital Signs:  Pain: Pain Assessment Pain Assessment: No/denies pain Pain Score: 0-No pain  See Function Navigator for Current Functional Status.   Therapy/Group: Individual Therapy  Lowella Grip 12/20/2014, 12:33 PM

## 2014-12-20 NOTE — Progress Notes (Signed)
Subjective/Complaints: Low CBG and Low BPs yest, around time of HD ? If TF was delayed by HD Discussed with Nephro today ROS cannot obtain due to expressive aphasia   Objective: Vital Signs: Blood pressure 95/53, pulse 110, temperature 98.5 F (36.9 C), temperature source Oral, resp. rate 18, weight 69.7 kg (153 lb 10.6 oz), SpO2 89 %. No results found. Results for orders placed or performed during the hospital encounter of 11/25/14 (from the past 72 hour(s))  Glucose, capillary     Status: None   Collection Time: 12/17/14 12:03 PM  Result Value Ref Range   Glucose-Capillary 79 65 - 99 mg/dL   Comment 1 Notify RN   Renal function panel     Status: Abnormal   Collection Time: 12/17/14  1:37 PM  Result Value Ref Range   Sodium 137 135 - 145 mmol/L   Potassium 4.4 3.5 - 5.1 mmol/L   Chloride 94 (L) 101 - 111 mmol/L   CO2 27 22 - 32 mmol/L   Glucose, Bld 81 65 - 99 mg/dL   BUN 59 (H) 6 - 20 mg/dL   Creatinine, Ser 4.64 (H) 0.44 - 1.00 mg/dL   Calcium 86.9 8.9 - 61.3 mg/dL   Phosphorus 3.5 2.5 - 4.6 mg/dL   Albumin 3.4 (L) 3.5 - 5.0 g/dL   GFR calc non Af Amer 7 (L) >60 mL/min   GFR calc Af Amer 8 (L) >60 mL/min    Comment: (NOTE) The eGFR has been calculated using the CKD EPI equation. This calculation has not been validated in all clinical situations. eGFR's persistently <60 mL/min signify possible Chronic Kidney Disease.    Anion gap 16 (H) 5 - 15  CBC     Status: Abnormal   Collection Time: 12/17/14  1:37 PM  Result Value Ref Range   WBC 8.8 4.0 - 10.5 K/uL   RBC 4.11 3.87 - 5.11 MIL/uL   Hemoglobin 11.3 (L) 12.0 - 15.0 g/dL   HCT 48.2 77.9 - 85.9 %   MCV 88.1 78.0 - 100.0 fL   MCH 27.5 26.0 - 34.0 pg   MCHC 31.2 30.0 - 36.0 g/dL   RDW 21.0 (H) 54.3 - 20.4 %   Platelets 353 150 - 400 K/uL  Hepatitis B surface antigen     Status: None   Collection Time: 12/17/14  2:38 PM  Result Value Ref Range   Hepatitis B Surface Ag Negative Negative    Comment:  (NOTE) Performed At: [facility] [address] [place], [place] [id number] Mila Homer MD PJ:[phone]   Glucose, capillary     Status: None   Collection Time: 12/17/14  7:18 PM  Result Value Ref Range   Glucose-Capillary 86 65 - 99 mg/dL  Glucose, capillary     Status: Abnormal   Collection Time: 12/18/14 12:12 AM  Result Value Ref Range   Glucose-Capillary 111 (H) 65 - 99 mg/dL   Comment 1 Notify RN   Glucose, capillary     Status: Abnormal   Collection Time: 12/18/14  6:01 AM  Result Value Ref Range   Glucose-Capillary 118 (H) 65 - 99 mg/dL   Comment 1 Notify RN   Glucose, capillary     Status: Abnormal   Collection Time: 12/18/14 11:52 AM  Result Value Ref Range   Glucose-Capillary 61 (L) 65 - 99 mg/dL   Comment 1 Notify RN   Glucose, capillary     Status: Abnormal   Collection Time: 12/18/14 12:18 PM  Result Value Ref Range   Glucose-Capillary 44 (LL) 65 - 99 mg/dL   Comment 1 Notify RN   Glucose, capillary     Status: None   Collection Time: 12/18/14  7:03 PM  Result Value Ref Range   Glucose-Capillary 90 65 - 99 mg/dL   Comment 1 Notify RN   Glucose, capillary     Status: Abnormal   Collection Time: 12/18/14 11:46 PM  Result Value Ref Range   Glucose-Capillary 101 (H) 65 - 99 mg/dL   Comment 1 Notify RN   Glucose, capillary     Status: Abnormal   Collection Time: 12/19/14  6:01 AM  Result Value Ref Range   Glucose-Capillary 109 (H) 65 - 99 mg/dL   Comment 1 Notify RN   Glucose, capillary     Status: None   Collection Time: 12/19/14 12:01 PM  Result Value Ref Range   Glucose-Capillary 84 65 - 99 mg/dL   Comment 1 Notify RN   CBC with Differential/Platelet     Status: Abnormal   Collection Time: 12/19/14  4:40 PM  Result Value Ref Range   WBC 8.0 4.0 - 10.5 K/uL   RBC 3.58 (L) 3.87 - 5.11 MIL/uL   Hemoglobin 9.6 (L) 12.0 - 15.0 g/dL   HCT 31.1 (L) 36.0 - 46.0 %   MCV 86.9 78.0 - 100.0 fL   MCH 26.8 26.0 - 34.0 pg   MCHC 30.9 30.0 - 36.0  g/dL   RDW 18.9 (H) 11.5 - 15.5 %   Platelets 317 150 - 400 K/uL   Neutrophils Relative % 52 %   Neutro Abs 4.2 1.7 - 7.7 K/uL   Lymphocytes Relative 23 %   Lymphs Abs 1.8 0.7 - 4.0 K/uL   Monocytes Relative 11 %   Monocytes Absolute 0.9 0.1 - 1.0 K/uL   Eosinophils Relative 13 %   Eosinophils Absolute 1.1 (H) 0.0 - 0.7 K/uL   Basophils Relative 0 %   Basophils Absolute 0.0 0.0 - 0.1 K/uL  Glucose, capillary     Status: Abnormal   Collection Time: 12/19/14  9:42 PM  Result Value Ref Range   Glucose-Capillary 58 (L) 65 - 99 mg/dL   Comment 1 Notify RN   Glucose, capillary     Status: None   Collection Time: 12/19/14 10:41 PM  Result Value Ref Range   Glucose-Capillary 81 65 - 99 mg/dL   Comment 1 Notify RN   Glucose, capillary     Status: Abnormal   Collection Time: 12/20/14  5:53 AM  Result Value Ref Range   Glucose-Capillary 118 (H) 65 - 99 mg/dL   Comment 1 Notify RN      HEENT: poor oral movements   Cardio: RRR and no murmur, no pain to palp of chest wall Resp: CTA B/L.no use of accessory muscles, No wheezes and no upper airway sounds, not able to cough on command GI: BS positive and NT, ND, prior midline abd incision with extensive scarring, scab with old sutures attached Extremity:  Pulses positive and No Edema Skin:    G tube site non tender Neuro: Flat but very alert, Cranial Nerve Abnormalities bilateral central 7,Aphasic and Apraxic, nodding for Y/N questions,rubbing R fingers together Musc/Skel:  Other decrease ROM in BIlateral knees and Right shoulder and elbow due to tone, no pain with UE or LE ROM Gen NAD   Assessment/Plan: 1. 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 Physiatrist is providing close team supervision and 24 hour management of  active medical problems listed below. Physiatrist and rehab team continue to assess barriers to discharge/monitor patient progress toward functional and medical goals.   FIM: Function - Bathing Position: Wheelchair/chair at sink Body parts bathed by patient: Right arm, Left arm, Chest, Abdomen, Right upper leg, Left upper leg Body parts bathed by helper: Front perineal area, Buttocks, Back Bathing not applicable: Right lower leg, Left lower leg Assist Level: Touching or steadying assistance(Pt > 75%)  Function- Upper Body Dressing/Undressing What is the patient wearing?: Pull over shirt/dress Bra - Perfomed by patient: Thread/unthread right bra strap, Thread/unthread left bra strap Bra - Perfomed by helper: Hook/unhook bra (pull down sports bra) Pull over shirt/dress - Perfomed by patient: Thread/unthread left sleeve, Thread/unthread right sleeve, Pull shirt over trunk, Put head through opening Pull over shirt/dress - Perfomed by helper: Put head through opening Assist Level: Touching or steadying assistance(Pt > 75%) Set up : To obtain clothing/put away Function - Lower Body Dressing/Undressing What is the patient wearing?: Pants, Non-skid slipper socks Position: Wheelchair/chair at sink Pants- Performed by helper: Thread/unthread right pants leg, Thread/unthread left pants leg, Pull pants up/down Non-skid slipper socks- Performed by helper: Don/doff right sock, Don/doff left sock Assist Level: Touching or steadying assistance (Pt > 75%)  Function - Toileting Toileting activity did not occur: No continent bowel/bladder event Toileting steps completed by helper: Adjust clothing prior to toileting, Performs perineal hygiene, Adjust clothing after toileting Toileting Assistive Devices: Grab bar or rail Assist level: Touching or steadying assistance (Pt.75%)  Function - Air cabin crew transfer activity did not occur: N/A Toilet transfer assistive device: Elevated toilet  seat/BSC over toilet Assist level to toilet: Touching or steadying assistance (Pt > 75%) Assist level from toilet: Supervision or verbal cues  Function - Chair/bed transfer Chair/bed transfer method: Stand pivot Chair/bed transfer assist level: Touching or steadying assistance (Pt > 75%) Chair/bed transfer assistive device: Bedrails Chair/bed transfer details: Verbal cues for sequencing, Tactile cues for sequencing, Tactile cues for posture, Tactile cues for weight shifting  Function - Locomotion: Wheelchair Will patient use wheelchair at discharge?: Yes Wheelchair activity did not occur:  (Attempted hand-over-hand facilitation for w/c propulsion, visual demonstration, however no pt initiation evident) Assist Level: Dependent (Pt equals 0%) Function - Locomotion: Ambulation Ambulation activity did not occur: Safety/medical concerns (Attempted ambulation, however upon standing x3 reps pt unable to step (unable to determine if due to$Remove' \\apraxia'eRwRBFf$  or pt fearful)) Assistive device: Other (comment) (rollator) Max distance: 150 Assist level: Touching or steadying assistance (Pt > 75%) Walk 10 feet activity did not occur: Safety/medical concerns Assist level: Touching or steadying assistance (Pt > 75%) Walk 50 feet with 2 turns activity did not occur: Safety/medical concerns Assist level: Touching or steadying assistance (Pt > 75%) Walk 150 feet activity did not occur: Safety/medical concerns Assist level: Touching or steadying assistance (Pt > 75%) Walk 10 feet on uneven surfaces activity did not occur: Safety/medical concerns  Function - Comprehension Comprehension: Auditory Comprehension assist level: Understands basic 50 - 74% of the time/ requires cueing 25 - 49% of the time  Function - Expression Expression: Nonverbal Expression assistive device: Other (Comment) Expression assist level: Expresses basis less than 25% of the time/requires cueing >75% of the time.  Function - Social  Interaction Social Interaction assist level: Interacts appropriately 25 - 49% of time -  Needs frequent redirection.  Function - Problem Solving Problem solving assist level: Solves basic 25 - 49% of the time - needs direction more than half the time to initiate, plan or complete simple activities  Function - Memory Memory assist level: Recognizes or recalls 25 - 49% of the time/requires cueing 50 - 75% of the time Patient normally able to recall (first 3 days only): None of the above  Medical Problem List and Plan: 1. Functional deficits secondary to left MCA infarct/respiratory failure. Right hemiparesis improving, now using Y/N nods, Alertness increasing with ritalin-some evidence of excess motor activity but no tachycardia 2. DVT Prophylaxis/Anticoagulation: SCDs. Monitor for any signs of DVT. 8/12 venous dopplers neg, mobility increasing so risk is as well 3. Pain Management: Tylenol as needed, no apparent pain during PT 4. Dysphagia. Patient is nothing by mouth. Status post jejunostomy tube 08/12/2014 per interventional radiology for nutritional support. poor prognosis for improvement given severe bilateral deficits cognitive deficits influence intake as well, do not anticipate d/c J tube during this admission, "pleasure feeds" 5. Neuropsych: This patient is not capable of making decisions on her own behalf. 6. Skin/Wound Care: Routine skin checks. J tube site maintenance. continue turning and nutrition, site looks good 7. Fluids/Electrolytes/Nutrition: Routine I&O. Adjust TF based on nutritional needs. Renal adjusting dialysis, complicated situation, Not a diabetic, getting CBGs because of TF, these have been lowyesterday, ? If TF was delayed by HD, discussed with Nephro 8. End-stage renal disease with hemodialysis. Follow-up per renal services. Hemodialysis in PM after therapies to avoid conflict, Nepro for nutrition,  9. Seizure prophylaxis. Keppra 500 mg twice a day, monitor drug  toxicity. No seizures, Ritalin can decrease seizure threshold however no evidence of this thus far 10. Hypothyroidism. Synthroid 11. Chronic anemia. Aranesp weekly with dialysis, hgb monitored in HD, nephro managing, last Hgb 11 2gm increase from prior value 12. History of asthma. Schedule nebulizers given aphasia. , no wheezing today, no volitional cough, secretions improving 13. Mood/anxiety. Continue Xanax on dialysis days 14.  Hx of recurrent resp failure, high risk for PNA given comorbid conditions, Comfortable. Secretions better. Afebrile. No distress.Now trialing po, monitor for aspiration    -wean oxygen down to keep sats >90%, rec IS, requiring only 2 L in PT rather than 3 L 15.  Bowel incont- monitor skin,last BM was cont 16.  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

## 2014-12-20 NOTE — Progress Notes (Signed)
[facility] Progress Note   Assessment/Plan: 1. 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 ESRD -TTS [facility] [employer]. HD yesterday 3. Anemia - HGB 9.6 on ESA Therapy. Check FOBT. Being loaded with Fe.  4. Secondary hyperparathyroidism - Ca 10.2 C Ca 10.5 Phos 3.5. Binders on hold, no calcitriol. Monitor Ca.  5. HTN/volume - HD yesterday. Net UF 653. Post Wt 69.7. Tolerated well.   6. Nutrition - Albumin 3.6. Dysphagia 1 diet is talking small amt POs and continue TF per peg. Concern about hypoglycemia yesterday.   Rita H. Brown NP-C 12/20/2014, 10:45 AM  [facility] [phone]  Renal Attending: Agree with note articulated above.  For HD TTS. Trudell,ALVIN C   Subjective:   Patient does not speak. Nods and smiles. Shakes head negatively when asked if in pain or SOB.   Objective Filed Vitals:   12/19/14 2310 12/20/14 0540 12/20/14 0544 12/20/14 0800  BP:  84/55 95/53   Pulse: 89 111 110   Temp:  98.5 F (36.9 C)    TempSrc:  Oral    Resp: 18 18    Weight:      SpO2: 99% 96%  89%   Physical Exam General: Chronically ill appearing female in NAD Heart:S1,S2, No M/G/R Lungs: Bilateral breath sounds slightly decreased in bases. On O2. No WOB present. Abdomen: Soft nontender. PEG tube present LUQ.  Extremities: No edema. Dialysis Access: LUA AVF + thrill + Bruit  Dialysis Orders: TTS [facility] 4h 2/2 Bath 91kg Heparin none Hect 2 ug Aranesp 200 ug/wk + venofer 50/wk  Additional Objective Labs: Basic Metabolic Panel:  Recent Labs Lab 12/14/14 0519 12/15/14 1654 12/17/14 1337  NA 135 133* 137  K 4.3 4.2 4.4  CL 94* 90* 94*  CO2 GLUCOSE 122* 100* 81  BUN 50* 82* 59*  CREATININE 5.54* 7.60* 6.16*  CALCIUM 9.8 9.7 10.2  PHOS  --  3.3 3.5   Liver Function Tests:  Recent Labs Lab 12/15/14 1654 12/17/14 1337  ALBUMIN 2.8* 3.4*   No results for input(s): LIPASE, AMYLASE in the last 168 hours. CBC:  Recent  Labs Lab 12/14/14 0519 12/15/14 1653 12/17/14 1337 12/19/14 1640  WBC 7.8 9.5 8.8 8.0  NEUTROABS  --  5.8  --  4.2  HGB 10.4* 9.4* 11.3* 9.6*  HCT 33.1* 30.5* 36.2 31.1*  MCV 88.0 87.1 88.1 86.9  PLT 367 401* 353 317   Blood Culture    Component Value Date/Time   SDES ABSCESS PELVIS 08/17/2014 1451   SPECREQUEST NONE 08/17/2014 1451   CULT  08/17/2014 1451    NO GROWTH 3 DAYS Performed at [facility]    REPTSTATUS 08/21/2014 FINAL 08/17/2014 1451    Cardiac Enzymes: No results for input(s): CKTOTAL, CKMB, CKMBINDEX, TROPONINI in the last 168 hours. CBG:  Recent Labs Lab 12/19/14 0601 12/19/14 1201 12/19/14 2142 12/19/14 2241 12/20/14 0553  GLUCAP 109* 84 58* 81 118*   Iron Studies: No results for input(s): IRON, TIBC, TRANSFERRIN, FERRITIN in the last 72 hours. @ Studies/Results: No results found. Medications: . feeding supplement (NEPRO CARB STEADY) 1,000 mL (12/19/14 2158)   . ALPRAZolam  0.5 mg Per Tube Q T,Th,Sa-HD  . antiseptic oral rinse  7 mL Mouth Rinse TID  . aspirin  325 mg Oral Daily  . budesonide  0.25 mg Nebulization BID  . chlorhexidine  15 mL Mouth/Throat BID  . [START ON 12/24/2014] darbepoetin (ARANESP) injection - DIALYSIS  100  mcg Intravenous Q Thu-HD  . ferric gluconate (FERRLECIT/NULECIT) IV  125 mg Intravenous Q T,Th,Sa-HD  . free water  50 mL Per Tube TID  . guaiFENesin  200 mg Per Tube TID  . ipratropium-albuterol  3 mL Nebulization TID  . lanolin   Topical BID  . levETIRAcetam  500 mg Oral BID  . levothyroxine  25 mcg Oral QAC breakfast  . methylphenidate  10 mg Per Tube BID WC  . mirtazapine  7.5 mg Per Tube QHS  . multivitamin  1 tablet Oral QHS  . pantoprazole sodium  40 mg Per Tube Q1200

## 2014-12-20 NOTE — Progress Notes (Signed)
Physical Therapy Session Note  Patient Details  Name: [name] MRN: [id number] Date of Birth: 1955/04/24  Today's Date: 12/20/2014 PT Individual Time: 1130-1200 PT Individual Time Calculation (min): 30 min   Short Term Goals: Week 1:  PT Short Term Goal 1 (Week 1): Will perform bed mobility with modA and use of bedrails PT Short Term Goal 1 - Progress (Week 1): Met PT Short Term Goal 2 (Week 1): Will perform supine <>sit maxA +1 and mod cues PT Short Term Goal 2 - Progress (Week 1): Met PT Short Term Goal 3 (Week 1): Will perform stand pivot transfer modA PT Short Term Goal 3 - Progress (Week 1): Met PT Short Term Goal 4 (Week 1): Will perform ambulation x25' with LRAD and maxA +1 PT Short Term Goal 4 - Progress (Week 1): Not met PT Short Term Goal 5 (Week 1): Will perform w/c propulsion x50' with modA PT Short Term Goal 5 - Progress (Week 1): Not met  Skilled Therapeutic Interventions/Progress Updates:  Pt was seen bedside in the am. Pt transferred sit to stand with min A. Pt ambulated to bathroom without assistive device and min A with verbal cues. Pt performed toilet transfers with min guard to min A. Pt ambulated with rollator x 3 for distances of 70', 80', and 150 feet with min guard to min A and verbal cues. Pt left sitting up in w/c with quick release belt in place and call bell within reach.   Therapy Documentation Precautions:  Precautions Precautions: Fall Precaution Comments: fall, NPO, pusher to the right at times, non-verbal,  Restrictions Weight Bearing Restrictions: No General:   Pain: No c/o pain.   See Function Navigator for Current Functional Status.   Therapy/Group: Individual Therapy  Dub Amis 12/20/2014, 12:04 PM

## 2014-12-20 NOTE — Progress Notes (Signed)
Returned from HD at 2135, anxious and tearful. Expiratory wheeze throughout, scheduled nebs given. O2 at 2L/M via [place]. PRN xanax given at 2343 with relief. Rested comfortably during night. [name] A

## 2014-12-20 NOTE — Significant Event (Signed)
Hypoglycemic Event  CBG: 58  Treatment: 15 GM carbohydrate snack  Symptoms: Nervous/irritable  Follow-up CBG: Time:2241 CBG Result:81  Possible Reasons for Event: Other: in HD, tube feed not started on time.  Comments/MD notified:MD not notified. Scheduled Nepro TF started in place of 15GM carb snack    Alfredo Martinez A  Remember to initiate Hypoglycemia Order Set & complete

## 2014-12-21 ENCOUNTER — Inpatient Hospital Stay (HOSPITAL_COMMUNITY): Payer: Medicaid Other | Admitting: Occupational Therapy

## 2014-12-21 ENCOUNTER — Inpatient Hospital Stay (HOSPITAL_COMMUNITY): Payer: Medicaid Other | Admitting: Speech Pathology

## 2014-12-21 ENCOUNTER — Inpatient Hospital Stay (HOSPITAL_COMMUNITY): Payer: Medicaid Other | Admitting: Physical Therapy

## 2014-12-21 DIAGNOSIS — R1314 Dysphagia, pharyngoesophageal phase: Secondary | ICD-10-CM

## 2014-12-21 LAB — GLUCOSE, CAPILLARY
GLUCOSE-CAPILLARY: 77 mg/dL (ref 65–99)
Glucose-Capillary: 106 mg/dL — ABNORMAL HIGH (ref 65–99)
Glucose-Capillary: 108 mg/dL — ABNORMAL HIGH (ref 65–99)
Glucose-Capillary: 78 mg/dL (ref 65–99)

## 2014-12-21 MED ORDER — MIRTAZAPINE 15 MG PO TABS
15.0000 mg | ORAL_TABLET | Freq: Every day | ORAL | Status: DC
  Administered 2014-12-21 – 2014-12-23 (×3): 15 mg
  Filled 2014-12-21 (×3): qty 1

## 2014-12-21 NOTE — Progress Notes (Signed)
Tearful at HS, PRN xanax given at 2156 with relief. Alfredo Martinez A

## 2014-12-21 NOTE — Progress Notes (Signed)
Subjective/Complaints: Discussed with SLP appetite initially better after MBS but now is minimal Pt nods head no  whn asked if she likes the pureed diet. ROS cannot obtain due to expressive aphasia   Objective: Vital Signs: Blood pressure 105/64, pulse 70, temperature 98 F (36.7 C), temperature source Oral, resp. rate 18, weight 75.1 kg (165 lb 9.1 oz), SpO2 93 %. No results found. Results for orders placed or performed during the hospital encounter of 11/25/14 (from the past 72 hour(s))  Glucose, capillary     Status: Abnormal   Collection Time: 12/18/14 11:52 AM  Result Value Ref Range   Glucose-Capillary 61 (L) 65 - 99 mg/dL   Comment 1 Notify RN   Glucose, capillary     Status: Abnormal   Collection Time: 12/18/14 12:18 PM  Result Value Ref Range   Glucose-Capillary 44 (LL) 65 - 99 mg/dL   Comment 1 Notify RN   Glucose, capillary     Status: None   Collection Time: 12/18/14  7:03 PM  Result Value Ref Range   Glucose-Capillary 90 65 - 99 mg/dL   Comment 1 Notify RN   Glucose, capillary     Status: Abnormal   Collection Time: 12/18/14 11:46 PM  Result Value Ref Range   Glucose-Capillary 101 (H) 65 - 99 mg/dL   Comment 1 Notify RN   Glucose, capillary     Status: Abnormal   Collection Time: 12/19/14  6:01 AM  Result Value Ref Range   Glucose-Capillary 109 (H) 65 - 99 mg/dL   Comment 1 Notify RN   Glucose, capillary     Status: None   Collection Time: 12/19/14 12:01 PM  Result Value Ref Range   Glucose-Capillary 84 65 - 99 mg/dL   Comment 1 Notify RN   CBC with Differential/Platelet     Status: Abnormal   Collection Time: 12/19/14  4:40 PM  Result Value Ref Range   WBC 8.0 4.0 - 10.5 K/uL   RBC 3.58 (L) 3.87 - 5.11 MIL/uL   Hemoglobin 9.6 (L) 12.0 - 15.0 g/dL   HCT 16.1 (L) 09.6 - 04.5 %   MCV 86.9 78.0 - 100.0 fL   MCH 26.8 26.0 - 34.0 pg   MCHC 30.9 30.0 - 36.0 g/dL   RDW 40.9 (H) 81.1 - 91.4 %   Platelets 317 150 - 400 K/uL   Neutrophils Relative % 52 %    Neutro Abs 4.2 1.7 - 7.7 K/uL   Lymphocytes Relative 23 %   Lymphs Abs 1.8 0.7 - 4.0 K/uL   Monocytes Relative 11 %   Monocytes Absolute 0.9 0.1 - 1.0 K/uL   Eosinophils Relative 13 %   Eosinophils Absolute 1.1 (H) 0.0 - 0.7 K/uL   Basophils Relative 0 %   Basophils Absolute 0.0 0.0 - 0.1 K/uL  Glucose, capillary     Status: Abnormal   Collection Time: 12/19/14  9:42 PM  Result Value Ref Range   Glucose-Capillary 58 (L) 65 - 99 mg/dL   Comment 1 Notify RN   Glucose, capillary     Status: None   Collection Time: 12/19/14 10:41 PM  Result Value Ref Range   Glucose-Capillary 81 65 - 99 mg/dL   Comment 1 Notify RN   Glucose, capillary     Status: Abnormal   Collection Time: 12/20/14  5:53 AM  Result Value Ref Range   Glucose-Capillary 118 (H) 65 - 99 mg/dL   Comment 1 Notify RN   Glucose, capillary  Status: None   Collection Time: 12/20/14 11:50 AM  Result Value Ref Range   Glucose-Capillary 77 65 - 99 mg/dL   Comment 1 Notify RN   Glucose, capillary     Status: None   Collection Time: 12/20/14  5:47 PM  Result Value Ref Range   Glucose-Capillary 85 65 - 99 mg/dL  Glucose, capillary     Status: Abnormal   Collection Time: 12/21/14 12:03 AM  Result Value Ref Range   Glucose-Capillary 106 (H) 65 - 99 mg/dL  Glucose, capillary     Status: Abnormal   Collection Time: 12/21/14  6:09 AM  Result Value Ref Range   Glucose-Capillary 108 (H) 65 - 99 mg/dL     HEENT: poor oral movements   Cardio: RRR and no murmur, no pain to palp of chest wall Resp: CTA B/L.no use of accessory muscles, No wheezes and no upper airway sounds, not able to cough on command GI: BS positive and NT, ND, prior midline abd incision with extensive scarring, scab with old sutures attached Extremity:  Pulses positive and No Edema Skin:    G tube site non tender Neuro: Flat but very alert, Cranial Nerve Abnormalities bilateral central 7,Aphasic and Apraxic, nodding for Y/N questions,rubbing R fingers  together Musc/Skel:  Other decrease ROM in BIlateral knees and Right shoulder and elbow due to tone, no pain with UE or LE ROM Gen NAD   Assessment/Plan: 1. 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 Physiatrist is providing close team supervision and 24 hour management of active medical problems listed below. Physiatrist and rehab team continue to assess barriers to discharge/monitor patient progress toward functional and medical goals.   FIM: Function - Bathing Position: Wheelchair/chair at sink Body parts bathed by patient: Right arm, Left arm, Chest, Abdomen, Right upper leg, Left upper leg Body parts bathed by helper: Front perineal area, Buttocks, Back Bathing not applicable: Right lower leg, Left lower leg Assist Level: Touching or steadying assistance(Pt > 75%)  Function- Upper Body Dressing/Undressing What is the patient wearing?: Pull over shirt/dress Bra - Perfomed by patient: Thread/unthread right bra strap, Thread/unthread left bra strap Bra - Perfomed by helper: Hook/unhook bra (pull down sports bra) Pull over shirt/dress - Perfomed by patient: Thread/unthread left sleeve, Thread/unthread right sleeve, Pull shirt over trunk, Put head through opening Pull over shirt/dress - Perfomed by helper: Put head through opening Assist Level: Touching or steadying assistance(Pt > 75%) Set up : To obtain clothing/put away Function - Lower Body Dressing/Undressing What is the patient wearing?: Pants, Non-skid slipper socks Position: Wheelchair/chair at sink Pants- Performed by helper: Thread/unthread right pants leg, Thread/unthread left pants leg, Pull pants up/down Non-skid slipper socks- Performed by helper: Don/doff right sock, Don/doff left sock Assist Level:  Touching or steadying assistance (Pt > 75%)  Function - Toileting Toileting activity did not occur: No continent bowel/bladder event Toileting steps completed by helper: Adjust clothing prior to toileting, Performs perineal hygiene, Adjust clothing after toileting Toileting Assistive Devices: Grab bar or rail Assist level: Touching or steadying assistance (Pt.75%)  Function - Archivist transfer activity did not occur: N/A Toilet transfer assistive device: Elevated toilet seat/BSC over toilet Assist level to toilet: Touching or steadying assistance (Pt > 75%) Assist level from toilet: Supervision or verbal cues  Function - Chair/bed transfer Chair/bed transfer  method: Stand pivot Chair/bed transfer assist level: Touching or steadying assistance (Pt > 75%) Chair/bed transfer assistive device: Armrests, Walker Chair/bed transfer details: Verbal cues for sequencing, Tactile cues for sequencing, Tactile cues for posture, Tactile cues for weight shifting  Function - Locomotion: Wheelchair Will patient use wheelchair at discharge?: Yes Wheelchair activity did not occur:  (Attempted hand-over-hand facilitation for w/c propulsion, visual demonstration, however no pt initiation evident) Assist Level: Dependent (Pt equals 0%) Function - Locomotion: Ambulation Ambulation activity did not occur: Safety/medical concerns (Attempted ambulation, however upon standing x3 reps pt unable to step (unable to determine if due to \\apraxia  or pt fearful)) Assistive device: Other (comment) (rollator) Max distance: 150 Assist level: Touching or steadying assistance (Pt > 75%) Walk 10 feet activity did not occur: Safety/medical concerns Assist level: Touching or steadying assistance (Pt > 75%) Walk 50 feet with 2 turns activity did not occur: Safety/medical concerns Assist level: Touching or steadying assistance (Pt > 75%) Walk 150 feet activity did not occur: Safety/medical concerns Assist  level: Touching or steadying assistance (Pt > 75%) Walk 10 feet on uneven surfaces activity did not occur: Safety/medical concerns  Function - Comprehension Comprehension: Auditory Comprehension assist level: Understands basic 50 - 74% of the time/ requires cueing 25 - 49% of the time  Function - Expression Expression: Nonverbal Expression assistive device: Other (Comment) Expression assist level: Expresses basis less than 25% of the time/requires cueing >75% of the time.  Function - Social Interaction Social Interaction assist level: Interacts appropriately 50 - 74% of the time - May be physically or verbally inappropriate.  Function - Problem Solving Problem solving assist level: Solves basic 25 - 49% of the time - needs direction more than half the time to initiate, plan or complete simple activities  Function - Memory Memory assist level: Recognizes or recalls 25 - 49% of the time/requires cueing 50 - 75% of the time Patient normally able to recall (first 3 days only): None of the above  Medical Problem List and Plan: 1. Functional deficits secondary to left MCA infarct/respiratory failure. Right hemiparesis improving, now using Y/N nods, Alertness increasing with ritalin-some evidence of excess motor activity but no tachycardia 2. DVT Prophylaxis/Anticoagulation: SCDs. Monitor for any signs of DVT. 8/12 venous dopplers neg, mobility increasing so risk is as well 3. Pain Management: Tylenol as needed, no apparent pain during PT 4. Dysphagia. Patient is nothing by mouth. Status post jejunostomy tube 08/12/2014 per interventional radiology for nutritional support. poor prognosis for improvement given severe bilateral deficits cognitive deficits influence intake as well, do not anticipate d/c J tube during this admission, "pleasure feeds" 5. Neuropsych: This patient is not capable of making decisions on her own behalf. 6. Skin/Wound Care: Routine skin checks. J tube site maintenance.  continue turning and nutrition, site looks good 7. Fluids/Electrolytes/Nutrition: Routine I&O. Adjust TF based on nutritional needs. Renal adjusting dialysis, complicated situation, Not a diabetic,  8. End-stage renal disease with hemodialysis. Follow-up per renal services. Hemodialysis in PM after therapies to avoid conflict, Nepro for nutrition,  9. Seizure prophylaxis. Keppra 500 mg twice a day, monitor drug toxicity. No seizures, Ritalin can decrease seizure threshold however no evidence of this thus far 10. Hypothyroidism. Synthroid 11. Chronic anemia. Aranesp weekly with dialysis, hgb monitored in HD, nephro managing, last Hgb 11 2gm increase from prior value 12. History of asthma. Schedule nebulizers given aphasia. , no wheezing today, no volitional cough, secretions improving but still had difficulty for volitional cough 13. Mood/anxiety. Continue Xanax  on dialysis days, increase remeron to  qhs 14.  Hx of recurrent resp failure, high risk for PNA given comorbid conditions, Comfortable. Secretions better. Afebrile. No distress.Now trialing po, monitor for aspiration, had coughing spell this am, decreased BS , check CXR    -wean oxygen down to keep sats >90%, rec IS, requiring only 2 L in PT rather than 3 L 15.  Bowel incont- monitor skin,last BM was cont 16.  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

## 2014-12-21 NOTE — Progress Notes (Signed)
Physical Therapy Session Note  Patient Details  Name: [name] MRN: [id number] Date of Birth: Aug 07, 1955  Today's Date: 12/21/2014 PT Individual Time: 1300-1345 PT Individual Time Calculation (min): 45 min   Short Term Goals: Week 4:  PT Short Term Goal 1 (Week 4): = LTG due to ELOS  Skilled Therapeutic Interventions/Progress Updates:    Pt received seated in w/c; no c/o pain and agreeable to treatment. Pt on 2L O2 via [place] throughout session. Gait training with rollator for 2 trials of 70' with supervision and assist for O2 management. Seated rest break between trials with pt pivoting to sit on rollator with CGA and increased time due to impaired sequencing and apraxia. Stand pivot transfer w/c <>mat table with CGA. Asked pt if she had considered getting a hospital bed over the weekend; pt adamantly shakes her head no to state that she does not want one. Discussed with pt importance of practicing bed mobility from a flat surface with no bedrails and pt agreeable. Performed sit >supine with modA lifting BLEs onto table, minA for rolling from R sidelying to supine. MinA rolling supine>R sidelying, and minA for sidelying >sit with cues for hand placement and technique. Pt instructed in deep diaphragmatic breathing with tactile cues of therapists hands on lower ribs, quick stretch applied at initiation of breath for facilitation of rib expansion. Instructed pt in use of incentive spirometer; several attempts and repetitive demonstration required before pt correctly inhales with use as opposed to exhales into incentive spirometer. Able to perform correctly x5 trials however unable to inspire >271ml. Will follow up next session to ensure proper use. Pt left seated in w/c with all needs in reach, QR belt intact at completion of session.   Therapy Documentation Precautions:  Precautions Precautions: Fall Precaution Comments: fall, NPO, pusher to the right at times, non-verbal,  Restrictions Weight  Bearing Restrictions: No Pain: Pain Assessment Pain Assessment: Faces Faces Pain Scale: No hurt   See Function Navigator for Current Functional Status.   Therapy/Group: Individual Therapy  [name] 12/21/2014, 4:13 PM

## 2014-12-21 NOTE — Progress Notes (Signed)
Occupational Therapy Session Note  Patient Details  Name: [name] HECK MRN: [id number] Date of Birth: Jul 31, 1955  Today's Date: 12/21/2014 OT Individual Time: 1032-1200 OT Individual Time Calculation (min): 88 min    Short Term Goals: Week 4:  OT Short Term Goal 1 (Week 4): STG = LTGs due to remaining LOS  Skilled Therapeutic Interventions/Progress Updates:    Treatment session with focus on ADL retraining, sit > stand, initiation, sequencing, and functional use of RUE.  Pt already dressed upon arrival and nods head "yes" to having completed grooming tasks.  Attempted to engage pt in LB dressing tasks with changing socks with pt able to cross leg over opposite knee and reach to foot to doff sock but unable to fully complete donning or doffing.  Therapeutic activity with focus on initiation and use of RUE with use of Connect 4 for sequencing and pattern making.  Pt with ability to follow pattern with Lt hand and able to identify when selecting incorrect piece.  Pt continues to have difficulty with terminating task and releasing items with Rt hand approx 50% of time.    Utilized Dynavision with focus on initiation, scanning to Rt visual field, and use of RUE.  Mode A with pt reactions time 1.40 and 1.56 seconds, speed impaired in second session to fatigue and Lt shoulder soreness with reaching across midline.  Pt continues to have mild delay in scanning to Rt visual field.  Mode B set to 5 second limitation to allow use of Rt hand only with pt correctly selecting 18/20 with reaction time of 2.68 seconds and 21/21 with reaction time of 2.77 seconds.  Mode B set to 3 second limitation with pt correctly selecting 67/73 with reaction time of 1.51 seconds.  Pt missed 5/6 in Rt upper quadrant, due to pain in Lt shoulder with pt occasionally shaking head "no" when scanning to light located in upper quadrant.  Therapy Documentation Precautions:  Precautions Precautions: Fall Precaution Comments: fall,  NPO, pusher to the right at times, non-verbal,  Restrictions Weight Bearing Restrictions: No General:   Vital Signs: Therapy Vitals Pulse Rate: (!) 115 Resp: 16 Patient Position (if appropriate): Sitting Oxygen Therapy SpO2: 98 % O2 Device: Nasal Cannula O2 Flow Rate (L/min): 2 L/min Pain: Pain Assessment Pain Assessment: No/denies pain  See Function Navigator for Current Functional Status.   Therapy/Group: Individual Therapy  Rosalio Loud 12/21/2014, 12:11 PM

## 2014-12-21 NOTE — Progress Notes (Signed)
Speech Language Pathology Daily Session Note  Patient Details  Name: [name] MRN: [id number] Date of Birth: December 26, 1955  Today's Date: 12/21/2014 SLP Individual Time: 0801-0900 SLP Individual Time Calculation (min): 59 min  Short Term Goals: Week 4: SLP Short Term Goal 1 (Week 4): Pt will improve management of her secretions as evidenced by ability to orally expectorate secretions and anteriorly contain her saliva with max assist multimodal cues.   SLP Short Term Goal 2 (Week 4): Pt will consume dys 1 textures and honey thick liquids with mod multimodal cues for use of swallowing precautions and minimal overt s/s  of aspiration.   SLP Short Term Goal 3 (Week 4): Pt will initiate a basic,familiar task with mod assist verbal cues over 75% of observable opportunities.  SLP Short Term Goal 4 (Week 4): Pt will visually scan to midline in >75% of observable opportunities during basic, functional tasks with mod verbal cues.  SLP Short Term Goal 5 (Week 4): Pt will vocalize to produce vowels in isolation with max assist multimodal cues.   SLP Short Term Goal 6 (Week 4): Pt will communicate basic wants and needs via multimodal communication with Mod assist multimodal cues  Skilled Therapeutic Interventions:  Pt was seen for skilled ST targeting goals for dysphagia and communication.  Upon arrival, pt was asleep in bed but easily awakened to voice and light touch and agreeable to participate in ST.  Pt was noted with decreased initiation and increased perseveration as she was awakening and required mod verbal cues for sequencing of transfer to wheelchair in a timely manner.  Pt consumed her currently prescribed diet with min-mod verbal and visual cues for use of swallowing precautions.  Pt was noted with a persistent cough both with and without PO intake.  Pt was successful for orally expectorating secretions x1 during session.  Pt continues to present with poor PO intake due to what appears to be  decreased motivation to consume dys 1 textures and honey thick liquids.  MD made aware of pt's changes in affect and appetite since initiation of PO diet.  Upon completion of meal, pt self initiated use of gesture to convey toileting needs to SLP.  Pt followed 1 and 2 step commands for sequencing of transfer to and from raised toilet seat with min assist verbal cues. Pt was returned to wheelchair and left with call bell within reach and quick release belt donned. Continue per current plan of care.    Function:  Eating Eating   Modified Consistency Diet: Yes Eating Assist Level: Supervision or verbal cues   Eating Set Up Assist For: Opening containers Helper Scoops Food on Utensil: Every scoop Helper Brings Food to Mouth: Occasionally   Cognition Comprehension Comprehension assist level: Understands basic 50 - 74% of the time/ requires cueing 25 - 49% of the time  Expression   Expression assist level: Expresses basic 25 - 49% of the time/requires cueing 50 - 75% of the time. Uses single words/gestures.  Social Interaction Social Interaction assist level: Interacts appropriately 50 - 74% of the time - May be physically or verbally inappropriate.  Problem Solving Problem solving assist level: Solves basic 25 - 49% of the time - needs direction more than half the time to initiate, plan or complete simple activities  Memory Memory assist level: Recognizes or recalls 25 - 49% of the time/requires cueing 50 - 75% of the time    Pain Pain Assessment Pain Assessment: No/denies pain  Therapy/Group: Individual Therapy  Smaran Gaus, Melanee Spry 12/21/2014, 11:48 AM

## 2014-12-21 NOTE — Progress Notes (Signed)
Nutrition Follow-up / Consult  DOCUMENTATION CODES:   Not applicable  INTERVENTION:    Continue nocturnal feeds of Nepro formula via J-tube at new goal rate of 85 ml/hr x 14 hours (6pm-8am) to provide 2142 kcal (100% of needs), 96 grams of protein, and 867 ml of free water.    Continue free water flushes 50 ml TID.   NUTRITION DIAGNOSIS:   Inadequate oral intake related to chronic illness, poor appetite as evidenced by meal completion < 25%.  Ongoing  GOAL:   Patient will meet greater than or equal to 90% of their needs  Met  MONITOR:   PO intake, TF tolerance, Weight trends, Labs, I & O's  REASON FOR ASSESSMENT:   Consult Enteral/tube feeding initiation and management  ASSESSMENT:   59 y.o. 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  Presented 11/10/2014 with right-sided weakness and left gaze deviation. MRI of the brain showed moderate size left MCA territory infarct affecting the left insula and operculum.  Received consult to adjust TF due to decreased blood sugars over the weekend. When discussing with RN, she reported that the TF had been held earlier in the day and that was the cause of glucose dropping low. Patient is receiving nocturnal feeds of Nepro via J-tube at goal rate of 85 ml/hr x 14 hours (6pm-8am) to provide 2142 kcal (100% of needs), 96 grams of protein, and 867 ml of free water. Also receiving 50 ml free water flushes TID. Tolerating TF well. She is also receiving a dysphagia 1 (pureed) diet with honey thick liquids. Intake is minimal (5-20% meal completion) and only for pleasure. TF is meeting nutrition needs.  Diet Order:  DIET - DYS 1 Room service appropriate?: Yes; Fluid consistency:: Honey Thick  Skin:   (Incision on R  chest, generalized edema)  Last BM:  9/19  Height:   Ht Readings from Last 1 Encounters:  11/11/14 5' 7.01" (1.702 m)    Weight:   Wt Readings from Last 1 Encounters:  12/21/14 165 lb 9.1 oz (75.1 kg)    Ideal Body Weight:  61 kg  BMI:  Body mass index is 25.93 kg/(m^2).  Estimated Nutritional Needs:   Kcal:  2000-2200  Protein:  95-115 grams  Fluid:  Per MD  EDUCATION NEEDS:   No education needs identified at this time  Molli Barrows, [place], [place], [place] Pager [phone] After Hours Pager [phone]

## 2014-12-22 ENCOUNTER — Inpatient Hospital Stay (HOSPITAL_COMMUNITY): Payer: Medicaid Other | Admitting: Speech Pathology

## 2014-12-22 ENCOUNTER — Inpatient Hospital Stay (HOSPITAL_COMMUNITY): Payer: Medicaid Other | Admitting: Physical Therapy

## 2014-12-22 ENCOUNTER — Inpatient Hospital Stay (HOSPITAL_COMMUNITY): Payer: Medicaid Other | Admitting: Occupational Therapy

## 2014-12-22 LAB — GLUCOSE, CAPILLARY
GLUCOSE-CAPILLARY: 107 mg/dL — AB (ref 65–99)
GLUCOSE-CAPILLARY: 117 mg/dL — AB (ref 65–99)
GLUCOSE-CAPILLARY: 73 mg/dL (ref 65–99)
Glucose-Capillary: 55 mg/dL — ABNORMAL LOW (ref 65–99)
Glucose-Capillary: 88 mg/dL (ref 65–99)

## 2014-12-22 LAB — RENAL FUNCTION PANEL
ALBUMIN: 2.8 g/dL — AB (ref 3.5–5.0)
Anion gap: 16 — ABNORMAL HIGH (ref 5–15)
BUN: 77 mg/dL — AB (ref 6–20)
CALCIUM: 9.5 mg/dL (ref 8.9–10.3)
CO2: 27 mmol/L (ref 22–32)
Chloride: 91 mmol/L — ABNORMAL LOW (ref 101–111)
Creatinine, Ser: 7.56 mg/dL — ABNORMAL HIGH (ref 0.44–1.00)
GFR calc Af Amer: 6 mL/min — ABNORMAL LOW (ref >60)
GFR calc non Af Amer: 5 mL/min — ABNORMAL LOW (ref >60)
GLUCOSE: 73 mg/dL (ref 65–99)
PHOSPHORUS: 4.2 mg/dL (ref 2.5–4.6)
POTASSIUM: 4.4 mmol/L (ref 3.5–5.1)
SODIUM: 134 mmol/L — AB (ref 135–145)

## 2014-12-22 LAB — CBC
HCT: 28.5 % — ABNORMAL LOW (ref 36.0–46.0)
Hemoglobin: 8.7 g/dL — ABNORMAL LOW (ref 12.0–15.0)
MCH: 26.3 pg (ref 26.0–34.0)
MCHC: 30.5 g/dL (ref 30.0–36.0)
MCV: 86.1 fL (ref 78.0–100.0)
PLATELETS: 292 10*3/uL (ref 150–400)
RBC: 3.31 MIL/uL — ABNORMAL LOW (ref 3.87–5.11)
RDW: 18.8 % — AB (ref 11.5–15.5)
WBC: 7.7 10*3/uL (ref 4.0–10.5)

## 2014-12-22 NOTE — Progress Notes (Signed)
Occupational Therapy Session Note  Patient Details  Name: [name] MRN: [id number] Date of Birth: 1955/11/30  Today's Date: 12/22/2014 OT Individual Time: 4098-1191 OT Individual Time Calculation (min): 55 min    Short Term Goals: Week 4:  OT Short Term Goal 1 (Week 4): STG = LTGs due to remaining LOS  Skilled Therapeutic Interventions/Progress Updates:    ADL retraining with focus on functional mobility, transfers, sit <> stand, and increased participation in self-care tasks.  Pt in bed upon arrival, performed bed mobility from flat bed without rails with mod assist for lifting and weight shifting.  Toilet transfer supervision w/c > toilet with increased time and hesitancy when attempting to sit on toilet.  Bathing and dressing completed at sit > stand level at sink with pt able to complete UB bathing and dressing with mod cues for thoroughness.  Pt continues to be resistant to completing LB dressing, however assisted with pulling pants over hips this session.  Noted pt's O2 dropped to 84 on 2L in standing, returning to mid 90s in sitting.  Pt requires verbal cues for pursed lip breathing due to decreased intake through nares.   Therapy Documentation Precautions:  Precautions Precautions: Fall Precaution Comments: fall, NPO, pusher to the right at times, non-verbal,  Restrictions Weight Bearing Restrictions: No General:   Vital Signs: Oxygen Therapy SpO2: 100 % O2 Device: Nasal Cannula O2 Flow Rate (L/min): 2 L/min Pain: Pain Assessment Pain Assessment: Faces Faces Pain Scale: No hurt  See Function Navigator for Current Functional Status.   Therapy/Group: Individual Therapy  Rosalio Loud 12/22/2014, 3:28 PM

## 2014-12-22 NOTE — Progress Notes (Signed)
Physical Therapy Session Note  Patient Details  Name: [name] MRN: [id number] Date of Birth: [birth year]  Today's Date: 12/22/2014 PT Individual Time: 1000-1100 and 1300-1330 PT Individual Time Calculation (min): 60 min and 30 min (total 90 min)   Short Term Goals: Week 4:  PT Short Term Goal 1 (Week 4): = LTG due to ELOS  Skilled Therapeutic Interventions/Progress Updates:    1000-1100: Pt received seated in w/c with no c/o pain and agreeable to treatment. Performed stand pivot transfer w/c <>nustep with minA and increased time due to apraxia with turning. Nustep with BUE/BLE x6 min with several short rest breaks due to fatigue. Pt encouraged to increase rate of stepping, however no change noted with pt performing very slow speed of approximately 15 steps per minute. Performed to improve UE/LE coordination, sustained attention to task, LE strengthening and aerobic endurance. Stair training for one trial of 8 3-inch stairs with close supervision, one trial of four 6-inch stairs with CGA/minA with facilitation during descent for eccentric control. Sitting on edge of mat table, performed RUE reaching task to match cards to board; pt requires minA for stabilizing card on board to allow for release of grip. Activity performed in standing with LUE reaching overhead to match cards as before; close supervision for standing balance. Pt returned to room and remained seated in w/c at completion of session, all needs in reach.   1300-1330: Pt received seated in w/c; no c/o pain. When questioned regarding lunch, pt indicates through head shakes to yes/no questions to report she has not eaten lunch. Pt declines mobility tasks initially because she has not eaten. Therapist found lunch tray at nurses station. Supervised and assisted pt with eating, focusing on use of BUE for [facility], sustained attention to task, and pt awareness of safe eating strategies including small bites and extra swallows. Requires assistance  for cutting peaches into smaller bites. Utilized Advertising account executive with pt cued to point to picture which correlates with therapist command (i.e. Which picture would you use to ask for your lights to be turned off?). Pt left seated in w/c with all needs in reach at completion of session.  Therapy Documentation Precautions:  Precautions Precautions: Fall Precaution Comments: fall, NPO, pusher to the right at times, non-verbal,  Restrictions Weight Bearing Restrictions: No Pain: Pain Assessment Pain Assessment: Faces Faces Pain Scale: No hurt   See Function Navigator for Current Functional Status.   Therapy/Group: Individual Therapy  [name] 12/22/2014, 12:15 PM

## 2014-12-22 NOTE — Procedures (Signed)
Patient seen and examined on HD LUA AVF. UFG 3500. Patient aphasic, but smiling, shakes head negatively to questions about pain/chest pain/SOB.  BP stable.   [name] MD (pgr) [phone]    ([phone] 12/22/2014, 5:05 PM

## 2014-12-22 NOTE — Progress Notes (Signed)
Speech Language Pathology Daily Session Note  Patient Details  Name: [name] MRN: [id number] Date of Birth: 1955/10/10  Today's Date: 12/22/2014 SLP Individual Time: 1331-1440 SLP Individual Time Calculation (min): 69 min  Short Term Goals: Week 4: SLP Short Term Goal 1 (Week 4): Pt will improve management of her secretions as evidenced by ability to orally expectorate secretions and anteriorly contain her saliva with max assist multimodal cues.   SLP Short Term Goal 2 (Week 4): Pt will consume dys 1 textures and honey thick liquids with mod multimodal cues for use of swallowing precautions and minimal overt s/s  of aspiration.   SLP Short Term Goal 3 (Week 4): Pt will initiate a basic,familiar task with mod assist verbal cues over 75% of observable opportunities.  SLP Short Term Goal 4 (Week 4): Pt will visually scan to midline in >75% of observable opportunities during basic, functional tasks with mod verbal cues.  SLP Short Term Goal 5 (Week 4): Pt will vocalize to produce vowels in isolation with max assist multimodal cues.   SLP Short Term Goal 6 (Week 4): Pt will communicate basic wants and needs via multimodal communication with Mod assist multimodal cues  Skilled Therapeutic Interventions:  Pt was seen for skilled ST targeting communication goals and family education.  Upon arrival, pt was seated upright in wheelchair, awake, alert, and agreeable to participate in ST.  SLP facilitated the session with trials of various communication boards to maximize functional communication.  Despite being able to match object phrase from a field of three with supervision-min assist, pt was unable to accurately point to pictures of objects on a picture board or to letters on an alphabet board without max assist multimodal cues due to motor planning and initiation deficits.  Pt would often perseverate on previously labeled items and required heavy cues to move on to a new target.  Pt's daughter was  present for part of today's session and SLP reviewed recommendation that pt's daughter use targeted yes/no questions to facilitate pt's communication versus use of communication board at this time.  SLP also provided skilled education regarding rationale behind pt's currently prescribed diet, including results of most recent MBS and pt's risk of aspiration.  SLP provided pt's daughter with a handout of dys 1 textures and instructed her on how to thicken liquids to the appropriate viscosity.  Pt's daughter would benefit from additional training to return demonstration of how to thicken liquids and to review and reinforce diet recommendations.  Pt's daughter was encouraged to provide pt with foods that are naturally prepared as purees versus modifying foods from their original form as pt continues to present with poor PO intake of pureed foods.  Pt's daughter also aware that pt will likely continue to need tube feeds to meet primary nutritional support until pt improves PO intake.  Pt was returned to room and left with daughter at bedside.  Continue per current plan of care.    Function:  Eating Eating                 Cognition Comprehension Comprehension assist level: Understands basic 50 - 74% of the time/ requires cueing 25 - 49% of the time  Expression   Expression assist level: Expresses basic 25 - 49% of the time/requires cueing 50 - 75% of the time. Uses single words/gestures.  Social Interaction Social Interaction assist level: Interacts appropriately 50 - 74% of the time - May be physically or verbally inappropriate.  Problem Solving  Problem solving assist level: Solves basic 25 - 49% of the time - needs direction more than half the time to initiate, plan or complete simple activities  Memory Memory assist level: Recognizes or recalls 25 - 49% of the time/requires cueing 50 - 75% of the time    Pain Pain Assessment Pain Assessment: Faces Pain Score: 0-No pain Faces Pain Scale: No  hurt  Therapy/Group: Individual Therapy  Page, Melanee Spry 12/22/2014, 4:17 PM

## 2014-12-22 NOTE — Progress Notes (Signed)
Hypoglycemic Event  CBG: 55  Treatment: 15 GM carbohydrate snack  Symptoms: Hungry  Follow-up CBG: Time:2240 CBG Result:88  Possible Reasons for Event: Inadequate meal intake  Comments/MD notified:Eunice Maisie Fus, no new orders    [place] Stage R  Remember to initiate Hypoglycemia Order Set & complete

## 2014-12-22 NOTE — Progress Notes (Signed)
Subjective/Complaints: Pt awake in bed, remains aphasic ROS cannot obtain due to expressive aphasia   Objective: Vital Signs: Blood pressure 116/63, pulse 103, temperature 98.7 F (37.1 C), temperature source Oral, resp. rate 17, weight 73.1 kg (161 lb 2.5 oz), SpO2 98 %. No results found. Results for orders placed or performed during the hospital encounter of 11/25/14 (from the past 72 hour(s))  Glucose, capillary     Status: None   Collection Time: 12/19/14 12:01 PM  Result Value Ref Range   Glucose-Capillary 84 65 - 99 mg/dL   Comment 1 Notify RN   CBC with Differential/Platelet     Status: Abnormal   Collection Time: 12/19/14  4:40 PM  Result Value Ref Range   WBC 8.0 4.0 - 10.5 K/uL   RBC 3.58 (L) 3.87 - 5.11 MIL/uL   Hemoglobin 9.6 (L) 12.0 - 15.0 g/dL   HCT 16.1 (L) 09.6 - 04.5 %   MCV 86.9 78.0 - 100.0 fL   MCH 26.8 26.0 - 34.0 pg   MCHC 30.9 30.0 - 36.0 g/dL   RDW 40.9 (H) 81.1 - 91.4 %   Platelets 317 150 - 400 K/uL   Neutrophils Relative % 52 %   Neutro Abs 4.2 1.7 - 7.7 K/uL   Lymphocytes Relative 23 %   Lymphs Abs 1.8 0.7 - 4.0 K/uL   Monocytes Relative 11 %   Monocytes Absolute 0.9 0.1 - 1.0 K/uL   Eosinophils Relative 13 %   Eosinophils Absolute 1.1 (H) 0.0 - 0.7 K/uL   Basophils Relative 0 %   Basophils Absolute 0.0 0.0 - 0.1 K/uL  Glucose, capillary     Status: Abnormal   Collection Time: 12/19/14  9:42 PM  Result Value Ref Range   Glucose-Capillary 58 (L) 65 - 99 mg/dL   Comment 1 Notify RN   Glucose, capillary     Status: None   Collection Time: 12/19/14 10:41 PM  Result Value Ref Range   Glucose-Capillary 81 65 - 99 mg/dL   Comment 1 Notify RN   Glucose, capillary     Status: Abnormal   Collection Time: 12/20/14  5:53 AM  Result Value Ref Range   Glucose-Capillary 118 (H) 65 - 99 mg/dL   Comment 1 Notify RN   Glucose, capillary     Status: None   Collection Time: 12/20/14 11:50 AM  Result Value Ref Range   Glucose-Capillary 77 65 - 99  mg/dL   Comment 1 Notify RN   Glucose, capillary     Status: None   Collection Time: 12/20/14  5:47 PM  Result Value Ref Range   Glucose-Capillary 85 65 - 99 mg/dL  Glucose, capillary     Status: Abnormal   Collection Time: 12/21/14 12:03 AM  Result Value Ref Range   Glucose-Capillary 106 (H) 65 - 99 mg/dL  Glucose, capillary     Status: Abnormal   Collection Time: 12/21/14  6:09 AM  Result Value Ref Range   Glucose-Capillary 108 (H) 65 - 99 mg/dL  Glucose, capillary     Status: None   Collection Time: 12/21/14 12:06 PM  Result Value Ref Range   Glucose-Capillary 77 65 - 99 mg/dL   Comment 1 Notify RN   Glucose, capillary     Status: None   Collection Time: 12/21/14  7:14 PM  Result Value Ref Range   Glucose-Capillary 78 65 - 99 mg/dL   Comment 1 Notify RN   Glucose, capillary     Status: Abnormal  Collection Time: 12/22/14 12:01 AM  Result Value Ref Range   Glucose-Capillary 107 (H) 65 - 99 mg/dL   Comment 1 Notify RN   Glucose, capillary     Status: Abnormal   Collection Time: 12/22/14  5:54 AM  Result Value Ref Range   Glucose-Capillary 117 (H) 65 - 99 mg/dL   Comment 1 Notify RN      HEENT: poor oral movements   Cardio: RRR and no murmur, no pain to palp of chest wall Resp: CTA B/L.no use of accessory muscles, No wheezes and no upper airway sounds, not able to cough on command GI: BS positive and NT, ND, prior midline abd incision with extensive scarring, scab with old sutures attached Extremity:  Pulses positive and No Edema Skin:    G tube site non tender Neuro: Flat but very alert, Cranial Nerve Abnormalities bilateral central 7,Aphasic and Apraxic, nodding for Y/N questions, Musc/Skel:  Other decrease ROM in BIlateral knees and Right shoulder and elbow due to tone, no pain with UE or LE ROM Gen NAD   Assessment/Plan: 1. 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 Physiatrist is providing close team supervision and 24 hour management of active medical problems listed below. Physiatrist and rehab team continue to assess barriers to discharge/monitor patient progress toward functional and medical goals.   FIM: Function - Bathing Position: Wheelchair/chair at sink Body parts bathed by patient: Right arm, Left arm, Chest, Abdomen, Right upper leg, Left upper leg Body parts bathed by helper: Front perineal area, Buttocks, Back Bathing not applicable: Right lower leg, Left lower leg Assist Level: Touching or steadying assistance(Pt > 75%)  Function- Upper Body Dressing/Undressing What is the patient wearing?: Pull over shirt/dress Bra - Perfomed by patient: Thread/unthread right bra strap, Thread/unthread left bra strap Bra - Perfomed by helper: Hook/unhook bra (pull down sports bra) Pull over shirt/dress - Perfomed by patient: Thread/unthread left sleeve, Thread/unthread right sleeve, Pull shirt over trunk, Put head through opening Pull over shirt/dress - Perfomed by helper: Put head through opening Assist Level: Touching or steadying assistance(Pt > 75%) Set up : To obtain clothing/put away Function - Lower Body Dressing/Undressing What is the patient wearing?: Pants, Non-skid slipper socks Position: Wheelchair/chair at sink Pants- Performed by helper: Thread/unthread right pants leg, Thread/unthread left pants leg, Pull pants up/down Non-skid slipper socks- Performed by helper: Don/doff right sock, Don/doff left sock Assist Level: Touching or steadying assistance (Pt > 75%)  Function - Toileting Toileting activity did not occur: No continent bowel/bladder event Toileting steps completed by helper: Adjust clothing prior to toileting, Performs perineal hygiene, Adjust clothing after toileting Toileting Assistive Devices: Grab bar  or rail Assist level: Touching or steadying assistance (Pt.75%)  Function - Archivist transfer activity did not occur: N/A Toilet transfer assistive device: Elevated toilet seat/BSC over toilet Assist level to toilet: Touching or steadying assistance (Pt > 75%) Assist level from toilet: Supervision or verbal cues  Function - Chair/bed transfer Chair/bed transfer method: Stand pivot Chair/bed transfer assist level: Touching or steadying assistance (Pt > 75%) Chair/bed transfer assistive device: Armrests, Walker Chair/bed transfer details: Verbal cues for sequencing, Tactile cues for sequencing, Tactile cues for posture, Tactile cues for weight shifting  Function - Locomotion: Wheelchair Will patient use wheelchair at discharge?: Yes Wheelchair activity did not occur:  (Attempted  hand-over-hand facilitation for w/c propulsion, visual demonstration, however no pt initiation evident) Assist Level: Dependent (Pt equals 0%) Function - Locomotion: Ambulation Ambulation activity did not occur: Safety/medical concerns (Attempted ambulation, however upon standing x3 reps pt unable to step (unable to determine if due to \\apraxia  or pt fearful)) Assistive device: Other (comment) (rollator) Max distance: 75 Assist level: Supervision or verbal cues Walk 10 feet activity did not occur: Safety/medical concerns Assist level: Supervision or verbal cues Walk 50 feet with 2 turns activity did not occur: Safety/medical concerns Assist level: Supervision or verbal cues Walk 150 feet activity did not occur: Safety/medical concerns Assist level: Touching or steadying assistance (Pt > 75%) Walk 10 feet on uneven surfaces activity did not occur: Safety/medical concerns  Function - Comprehension Comprehension: Auditory Comprehension assist level: Understands basic 50 - 74% of the time/ requires cueing 25 - 49% of the time  Function - Expression Expression: Nonverbal Expression assistive  device: Other (Comment) Expression assist level: Expresses basic 25 - 49% of the time/requires cueing 50 - 75% of the time. Uses single words/gestures.  Function - Social Interaction Social Interaction assist level: Interacts appropriately 50 - 74% of the time - May be physically or verbally inappropriate.  Function - Problem Solving Problem solving assist level: Solves basic 25 - 49% of the time - needs direction more than half the time to initiate, plan or complete simple activities  Function - Memory Memory assist level: Recognizes or recalls 25 - 49% of the time/requires cueing 50 - 75% of the time Patient normally able to recall (first 3 days only): None of the above  Medical Problem List and Plan: 1. Functional deficits secondary to left MCA infarct/respiratory failure. Right hemiparesis improving, now using Y/N nods, Alertness increasing with ritalin-some evidence of excess motor activity but no tachycardia 2. DVT Prophylaxis/Anticoagulation: SCDs. Monitor for any signs of DVT. 8/12 venous dopplers neg, mobility increasing so risk is as well 3. Pain Management: Tylenol as needed, no apparent pain during PT 4. Dysphagia. Patient is nothing by mouth. Status post jejunostomy tube 08/12/2014 per interventional radiology for nutritional support. poor prognosis for improvement given severe bilateral deficits cognitive deficits influence intake as well, do not anticipate d/c J tube during this admission, "pleasure feeds" 5. Neuropsych: This patient is not capable of making decisions on her own behalf. 6. Skin/Wound Care: Routine skin checks. J tube site maintenance. continue turning and nutrition, site looks good 7. Fluids/Electrolytes/Nutrition: Routine I&O. Adjust TF based on nutritional needs. Renal adjusting dialysis, complicated situation, Not a diabetic,  8. End-stage renal disease with hemodialysis. Follow-up per renal services. Hemodialysis in PM after therapies to avoid conflict,  Nepro for nutrition,  9. Seizure prophylaxis. Keppra 500 mg twice a day, monitor drug toxicity. No seizures, Ritalin can decrease seizure threshold however no evidence of this thus far 10. Hypothyroidism. Synthroid 11. Chronic anemia. Aranesp weekly with dialysis, hgb monitored in HD, nephro managing, last Hgb 11 2gm increase from prior value 12. History of asthma. Schedule nebulizers given aphasia. , no wheezing today, no volitional cough, secretions improving but still had difficulty for volitional cough 13. Mood/anxiety. Continue Xanax on dialysis days, increase remeron to 15mg  qhs 14.  Hx of recurrent resp failure, high risk for PNA given comorbid conditions, Comfortable. Secretions better. Afebrile. No distress.Now trialing po, monitor for aspiration, had coughing spell this am, decreased BS , check CXR    -wean oxygen down to keep sats >90%, rec IS, requiring only 2 L in PT rather than  3 L 15.  Bowel incont- monitor skin,last BM was cont 16.  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

## 2014-12-22 NOTE — Progress Notes (Signed)
Social Work [name], female   DOB: [birth year], 59 y.o.   MRN: [id number] Met with daughter to discuss her need to come into finish family education and tube feeding education.  She will come in tomorrow. She feels her Mom will not eat the pureed food at home, since Doesn't look like real food. Awaiting equipment from team to complete her order and will need O2 sats taken to justify home O2. Will work toward discharge on Thursday

## 2014-12-23 ENCOUNTER — Inpatient Hospital Stay (HOSPITAL_COMMUNITY): Payer: Medicaid Other | Admitting: Speech Pathology

## 2014-12-23 ENCOUNTER — Inpatient Hospital Stay (HOSPITAL_COMMUNITY): Payer: Medicaid Other | Admitting: Physical Therapy

## 2014-12-23 ENCOUNTER — Inpatient Hospital Stay (HOSPITAL_COMMUNITY): Payer: Medicaid Other | Admitting: Occupational Therapy

## 2014-12-23 LAB — GLUCOSE, CAPILLARY
GLUCOSE-CAPILLARY: 236 mg/dL — AB (ref 65–99)
GLUCOSE-CAPILLARY: 61 mg/dL — AB (ref 65–99)
Glucose-Capillary: 130 mg/dL — ABNORMAL HIGH (ref 65–99)
Glucose-Capillary: 63 mg/dL — ABNORMAL LOW (ref 65–99)
Glucose-Capillary: 91 mg/dL (ref 65–99)
Glucose-Capillary: 93 mg/dL (ref 65–99)

## 2014-12-23 LAB — OCCULT BLOOD X 1 CARD TO LAB, STOOL: Fecal Occult Bld: NEGATIVE

## 2014-12-23 MED ORDER — GLUCOSE 40 % PO GEL
ORAL | Status: AC
  Administered 2014-12-23: 37.5 g
  Filled 2014-12-23: qty 1

## 2014-12-23 NOTE — Progress Notes (Signed)
Subjective/Complaints: Working with PT, "appears down" Nods yes to poor sleep and breathing issues Per PT awaiting resp treatment ROS cannot obtain due to expressive aphasia   Objective: Vital Signs: Blood pressure 91/70, pulse 107, temperature 98.8 F (37.1 C), temperature source Oral, resp. rate 18, weight 72.1 kg (158 lb 15.2 oz), SpO2 100 %. No results found. Results for orders placed or performed during the hospital encounter of 11/25/14 (from the past 72 hour(s))  Glucose, capillary     Status: None   Collection Time: 12/20/14 11:50 AM  Result Value Ref Range   Glucose-Capillary 77 65 - 99 mg/dL   Comment 1 Notify RN   Glucose, capillary     Status: None   Collection Time: 12/20/14  5:47 PM  Result Value Ref Range   Glucose-Capillary 85 65 - 99 mg/dL  Glucose, capillary     Status: Abnormal   Collection Time: 12/21/14 12:03 AM  Result Value Ref Range   Glucose-Capillary 106 (H) 65 - 99 mg/dL  Glucose, capillary     Status: Abnormal   Collection Time: 12/21/14  6:09 AM  Result Value Ref Range   Glucose-Capillary 108 (H) 65 - 99 mg/dL  Glucose, capillary     Status: None   Collection Time: 12/21/14 12:06 PM  Result Value Ref Range   Glucose-Capillary 77 65 - 99 mg/dL   Comment 1 Notify RN   Glucose, capillary     Status: None   Collection Time: 12/21/14  7:14 PM  Result Value Ref Range   Glucose-Capillary 78 65 - 99 mg/dL   Comment 1 Notify RN   Glucose, capillary     Status: Abnormal   Collection Time: 12/22/14 12:01 AM  Result Value Ref Range   Glucose-Capillary 107 (H) 65 - 99 mg/dL   Comment 1 Notify RN   Glucose, capillary     Status: Abnormal   Collection Time: 12/22/14  5:54 AM  Result Value Ref Range   Glucose-Capillary 117 (H) 65 - 99 mg/dL   Comment 1 Notify RN   Glucose, capillary     Status: None   Collection Time: 12/22/14 11:47 AM  Result Value Ref Range   Glucose-Capillary 73 65 - 99 mg/dL  CBC     Status: Abnormal   Collection Time:  12/22/14  5:00 PM  Result Value Ref Range   WBC 7.7 4.0 - 10.5 K/uL   RBC 3.31 (L) 3.87 - 5.11 MIL/uL   Hemoglobin 8.7 (L) 12.0 - 15.0 g/dL   HCT 28.5 (L) 36.0 - 46.0 %   MCV 86.1 78.0 - 100.0 fL   MCH 26.3 26.0 - 34.0 pg   MCHC 30.5 30.0 - 36.0 g/dL   RDW 18.8 (H) 11.5 - 15.5 %   Platelets 292 150 - 400 K/uL  Renal function panel     Status: Abnormal   Collection Time: 12/22/14  5:00 PM  Result Value Ref Range   Sodium 134 (L) 135 - 145 mmol/L   Potassium 4.4 3.5 - 5.1 mmol/L   Chloride 91 (L) 101 - 111 mmol/L   CO2 27 22 - 32 mmol/L   Glucose, Bld 73 65 - 99 mg/dL   BUN 77 (H) 6 - 20 mg/dL   Creatinine, Ser 7.56 (H) 0.44 - 1.00 mg/dL   Calcium 9.5 8.9 - 10.3 mg/dL   Phosphorus 4.2 2.5 - 4.6 mg/dL   Albumin 2.8 (L) 3.5 - 5.0 g/dL   GFR calc non Af Amer 5 (L) >60  mL/min   GFR calc Af Amer 6 (L) >60 mL/min    Comment: (NOTE) The eGFR has been calculated using the CKD EPI equation. This calculation has not been validated in all clinical situations. eGFR's persistently <60 mL/min signify possible Chronic Kidney Disease.    Anion gap 16 (H) 5 - 15  Glucose, capillary     Status: Abnormal   Collection Time: 12/22/14  9:53 PM  Result Value Ref Range   Glucose-Capillary 55 (L) 65 - 99 mg/dL  Glucose, capillary     Status: None   Collection Time: 12/22/14 10:37 PM  Result Value Ref Range   Glucose-Capillary 88 65 - 99 mg/dL  Glucose, capillary     Status: Abnormal   Collection Time: 12/23/14 12:07 AM  Result Value Ref Range   Glucose-Capillary 130 (H) 65 - 99 mg/dL   Comment 1 Notify RN   Glucose, capillary     Status: Abnormal   Collection Time: 12/23/14  6:04 AM  Result Value Ref Range   Glucose-Capillary 236 (H) 65 - 99 mg/dL     HEENT: poor oral movements   Cardio: RRR and no murmur, no pain to palp of chest wall Resp: CTA B/L.no use of accessory muscles, No wheezes and no upper airway sounds, not able to cough on command GI: BS positive and NT, ND, prior midline  abd incision with extensive scarring, scab with old sutures attached Extremity:  Pulses positive and No Edema Skin:    G tube site non tender Neuro: Flat but very alert, Cranial Nerve Abnormalities bilateral central 7,Aphasic and Apraxic, nodding for Y/N questions,4/5 on Right side , 4/5 LLE, 4+ LUE Musc/Skel:  Other decrease ROM in BIlateral knees and Right shoulder and elbow due to tone, no pain with UE or LE ROM Gen NAD   Assessment/Plan: 1. 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 Physiatrist is providing close team supervision and 24 hour management of active medical problems listed below. Physiatrist and rehab team continue to assess barriers to discharge/monitor patient progress toward functional and medical goals.   FIM: Function - Bathing Position: Wheelchair/chair at sink Body parts bathed by patient: Right arm, Left arm, Chest, Abdomen, Right upper leg, Left upper leg Body parts bathed by helper: Front perineal area, Buttocks, Right lower leg, Left lower leg, Back Bathing not applicable: Right lower leg, Left lower leg Assist Level: Touching or steadying assistance(Pt > 75%)  Function- Upper Body Dressing/Undressing What is the patient wearing?: Pull over shirt/dress, Bra Bra - Perfomed by patient: Thread/unthread right bra strap, Thread/unthread left bra strap Bra - Perfomed by helper: Hook/unhook bra (pull down sports bra) Pull over shirt/dress - Perfomed by patient: Thread/unthread left sleeve, Thread/unthread right sleeve, Pull shirt over trunk, Put head through opening Pull over shirt/dress - Perfomed by helper: Put head through opening Assist Level: Touching or steadying assistance(Pt > 75%) Set up : To obtain clothing/put away Function - Lower Body  Dressing/Undressing What is the patient wearing?: Pants, Non-skid slipper socks Position: Wheelchair/chair at sink Pants- Performed by helper: Thread/unthread right pants leg, Thread/unthread left pants leg, Pull pants up/down Non-skid slipper socks- Performed by helper: Don/doff right sock, Don/doff left sock Assist Level: Touching or steadying assistance (Pt > 75%)  Function - Toileting Toileting activity did not occur: No continent bowel/bladder event Toileting steps completed  by helper: Adjust clothing prior to toileting, Performs perineal hygiene, Adjust clothing after toileting Toileting Assistive Devices: Grab bar or rail Assist level: Touching or steadying assistance (Pt.75%)  Function - Toilet Transfers Toilet transfer activity did not occur: N/A Toilet transfer assistive device: Elevated toilet seat/BSC over toilet Assist level to toilet: Touching or steadying assistance (Pt > 75%) Assist level from toilet: Touching or steadying assistance (Pt > 75%)  Function - Chair/bed transfer Chair/bed transfer method: Stand pivot Chair/bed transfer assist level: Touching or steadying assistance (Pt > 75%) Chair/bed transfer assistive device: Armrests Chair/bed transfer details: Verbal cues for sequencing, Tactile cues for sequencing, Tactile cues for posture, Tactile cues for weight shifting  Function - Locomotion: Wheelchair Will patient use wheelchair at discharge?: Yes Wheelchair activity did not occur:  (Attempted hand-over-hand facilitation for w/c propulsion, visual demonstration, however no pt initiation evident) Assist Level: Dependent (Pt equals 0%) Function - Locomotion: Ambulation Ambulation activity did not occur: Safety/medical concerns (Attempted ambulation, however upon standing x3 reps pt unable to step (unable to determine if due to \apraxia or pt fearful)) Assistive device: Other (comment) (rollator) Max distance: 75 Assist level: Supervision or verbal cues Walk 10  feet activity did not occur: Safety/medical concerns Assist level: Supervision or verbal cues Walk 50 feet with 2 turns activity did not occur: Safety/medical concerns Assist level: Supervision or verbal cues Walk 150 feet activity did not occur: Safety/medical concerns Assist level: Touching or steadying assistance (Pt > 75%) Walk 10 feet on uneven surfaces activity did not occur: Safety/medical concerns  Function - Comprehension Comprehension: Auditory Comprehension assist level: Understands basic 50 - 74% of the time/ requires cueing 25 - 49% of the time  Function - Expression Expression: Nonverbal Expression assistive device: Other (Comment) Expression assist level: Expresses basic 25 - 49% of the time/requires cueing 50 - 75% of the time. Uses single words/gestures.  Function - Social Interaction Social Interaction assist level: Interacts appropriately 50 - 74% of the time - May be physically or verbally inappropriate.  Function - Problem Solving Problem solving assist level: Solves basic 25 - 49% of the time - needs direction more than half the time to initiate, plan or complete simple activities  Function - Memory Memory assist level: Recognizes or recalls 25 - 49% of the time/requires cueing 50 - 75% of the time Patient normally able to recall (first 3 days only): None of the above  Medical Problem List and Plan: 1. Functional deficits secondary to left MCA infarct/respiratory failure. Right hemiparesis improving, now using Y/N nods, Alertness increasing with ritalin-some evidence of excess motor activity but no tachycardia 2. DVT Prophylaxis/Anticoagulation: SCDs. Monitor for any signs of DVT. 8/12 venous dopplers neg, mobility increasing so risk is as well 3. Pain Management: Tylenol as needed, no apparent pain during PT 4. Dysphagia. Patient is nothing by mouth. Status post jejunostomy tube 08/12/2014 per interventional radiology for nutritional support. poor prognosis for  improvement given severe bilateral deficits cognitive deficits influence intake as well, do not anticipate d/c J tube during this admission, "pleasure feeds" 5. Neuropsych: This patient is not capable of making decisions on her own behalf. 6. Skin/Wound Care: Routine skin checks. J tube site maintenance. continue turning and nutrition, site looks good 7. Fluids/Electrolytes/Nutrition: Routine I&O. Adjust TF based on nutritional needs. Renal adjusting dialysis, complicated situation, Not a diabetic,  8. End-stage renal disease with hemodialysis. Follow-up per renal services. Hemodialysis in PM after therapies to avoid conflict, Nepro for nutrition,  9. Seizure prophylaxis. Keppra 500  mg twice a day, monitor drug toxicity. No seizures, Ritalin can decrease seizure threshold however no evidence of this thus far 10. Hypothyroidism. Synthroid 11. Chronic anemia. Aranesp weekly with dialysis, hgb monitored in HD, nephro managing, last Hgb 11 2gm increase from prior value 12. History of asthma. Schedule nebulizers given aphasia. , no wheezing today, no volitional cough, secretions improving but still had difficulty for volitional cough 13. Mood/anxiety. Continue Xanax on dialysis days, increase remeron to 30m qhs 14.  Hx of recurrent resp failure, high risk for PNA given comorbid conditions, Comfortable. Secretions better. Afebrile. No distress.Now trialing po, monitor for aspiration, had coughing spell this am, decreased BS , check CXR    -wean oxygen down to keep sats >90%, rec IS, requiring only 2 L in PT rather than 3 L 15.  Bowel incont- monitor skin,last BM was cont 16.  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  ? Insomnia, discuss with RN in care team LOS (Days) 28 A FACE TO FACE EVALUATION WAS PERFORMED  KIRSTEINS,ANDREW E 12/23/2014, 9:43 AM

## 2014-12-23 NOTE — Progress Notes (Signed)
SATURATION QUALIFICATIONS: (This note is used to comply with regulatory documentation for home oxygen)  Patient Saturations on Room Air at Rest = 86%  Patient Saturations on Room Air while Ambulating =74 %  Patient Saturations on 2 Liters of oxygen while Ambulating = 84%  Please briefly explain why patient needs home oxygen:Pt requires home O2 due to CHF and ESRD. She is on O2 continuous at 2 Liters.

## 2014-12-23 NOTE — Progress Notes (Signed)
Occupational Therapy Session Note  Patient Details  Name: [name] MRN: [id number] Date of Birth: 1955-06-27  Today's Date: 12/23/2014 OT Individual Time: 0730-0830 OT Individual Time Calculation (min): 60 min    Short Term Goals: Week 4:  OT Short Term Goal 1 (Week 4): STG = LTGs due to remaining LOS  Skilled Therapeutic Interventions/Progress Updates:    ADL retraining with focus on bed mobility, sit <> stand, functional transfers, and increased participation in self-care tasks.  Pt in bed upon arrival, required increased time for initiation and sequencing of bed mobility.  Utilized bed rails and completed bed mobility with min/steady assist.  Pt required increased time for sit > stand, eventually standing with mod assist from EOB.  Pt transferred to w/c with min/steady assist once up with cues for sequencing.  Pt with frequent shaking of head throughout session, requiring increased question cues to decipher source of frustration/discomfort.  Toilet transfer w/c > elevated toilet in bathroom with use of grab bars and steady assist, noted pt to be incontinent of loose stool requiring assist for hygiene post BM.  Bathing and dressing completed at sink with pt requires cues for thoroughness.  One UE on sink and other pushing up from w/c with pt able to stand steady assist-supervision depending on level of fatigue.  Pt continues to require total assist with LB dressing due to refusal to attempt.   Therapy Documentation Precautions:  Precautions Precautions: Fall Precaution Comments: fall, NPO, pusher to the right at times, non-verbal,  Restrictions Weight Bearing Restrictions: No General:   Vital Signs: Therapy Vitals Temp: 98.8 F (37.1 C) Temp Source: Oral Pulse Rate: (!) 107 Resp: 18 BP: 91/70 mmHg Patient Position (if appropriate): Lying Oxygen Therapy SpO2: 100 % O2 Device: Nasal Cannula O2 Flow Rate (L/min): 2 L/min Pain:  Pt with no c/o pain  See Function Navigator  for Current Functional Status.   Therapy/Group: Individual Therapy  Rosalio Loud 12/23/2014, 9:23 AM

## 2014-12-23 NOTE — Discharge Summary (Signed)
[name], [name] NO.:  [id number]  MEDICAL RECORD NO.:  [id number]  LOCATION:  [id number]                        FACILITY:  [facility]  PHYSICIAN:  Erick Colace, M.D.DATE OF BIRTH:  05-Oct-1955  DATE OF ADMISSION:  11/25/2014 DATE OF DISCHARGE:  12/24/2014                              DISCHARGE SUMMARY   DISCHARGE DIAGNOSES: 1. Functional deficits secondary to left MCA infarction, respiratory     failure. 2. SCDs for DVT prophylaxis. 3. Pain management. 4. Dysphagia, status post jejunostomy, Aug 12, 2014. 5. End-stage renal disease, on hemodialysis. 6. Seizure prophylaxis. 7. Hypothyroidism. 8. Chronic anemia. 9. History of asthma. 10.Anxiety.  HISTORY OF PRESENT ILLNESS:  This is a 59 year old right-handed female with history of asthma, end-stage renal disease, exploratory laparotomy, for massive pneumoperitoneum repair, perforated pyloric ulcer March 2016, right anterior cerebral artery infarct May 2016, status post gastrostomy tube Aug 12, 2014, per Interventional Radiology.  The patient did receive inpatient rehab services after that initial event of CVA.  She was discharged to home, ambulating short distances with a rolling walker, minimal guard.  Presented November 10, 2014, with right- sided weakness and left gaze.  She did require intubation, mechanical ventilation for acute respiratory failure and later extubated.  MRI of the brain showed moderate-sized left MCA territory infarction affecting the left insula.  Expected evolution of posterior right ACA, posterior left MCA infarcts.  The patient did receive tPA.  Echocardiogram with ejection fraction of 65%, no wall motion abnormalities.  EEG consistent with toxic metabolic encephalopathy.  No seizure activity noted. Maintained on Keppra for seizure prophylaxis.  CT angiogram of the head and neck showed no large vessel occlusion.  Neurology consulted, initially maintained on aspirin therapy.   Hemodialysis ongoing. Gastrostomy tube feeds for nutritional support.  She remained n.p.o. Noted patient respiratory distress.  Increased anxiety while on dialysis, November 24, 2014.  Chest x-ray, partial improvement of recent left lower lobe airspace consolidation, responded well to dose of Solu- Medrol with noted history of asthma.  Physical and occupational therapy ongoing.  The patient was admitted for comprehensive rehab program.  PAST MEDICAL HISTORY:  See discharge diagnoses.  SOCIAL HISTORY:  Lives with family, used a walker prior to admission.  FUNCTIONAL STATUS UPON ADMISSION TO REHAB SERVICES:  Total assist of 2 for general bed mobility, +2 for physical assistance supine to sit, mod max assist, activities of daily living.  PHYSICAL EXAMINATION:  VITAL SIGNS:  Blood pressure 122/78, pulse 103, temperature 98, respirations 20. GENERAL:  This was an alert female, left gaze deviation.  She was very limited to follow commands, limited verbal commands. LUNGS:  Decreased breath sounds.  Clear to auscultation. CARDIAC:  Regular rate and rhythm. ABDOMEN:  Soft, nontender.  Good bowel sounds.  PEG tube in place.  REHABILITATION HOSPITAL COURSE:  The patient was admitted to inpatient rehab services with therapies initiated on a 3-hour daily basis consisting of physical therapy, occupational therapy, speech therapy, and rehabilitation nursing.  The following issues were addressed during the patient's rehabilitation stay.  Pertaining to Mrs. Eliasen's left MCA territory infarct as well as respiratory failure remained stable.  She would follow up Neurology services and  remained on aspirin therapy. SCDs for DVT prophylaxis.  Ritalin was added to her regimen to help stimulate overall ability to maintain focus and attend to task.  She remained n.p.o. with tube feeds as advised for nutritional support. Tube have been placed Aug 12, 2014, per Interventional Radiology.  Blood pressures well  controlled.  Monitor closely while receiving hemodialysis as per Renal services.  Chronic anemia, stable, monitored.  She continued on Aranesp.  Bouts of anxiety associated with her dialysis. She was on Xanax as well as Remeron.  The patient received weekly collaborative interdisciplinary team conferences to discuss estimated length of stay, family teaching, and any barriers to discharge.  She performs stand pivot transfers, wheelchair with min assist,  increased time due to some apraxia.  Bilateral upper and lower extremities working with short rest breaks due to fatigue.  Encouraged to increase rate of stepping, needing some encouragement at times to participate and focus to her task.  Stair training for one trial of eight 3-inch stairs with close supervision.  One trial of four 6-inch stairs contact guard assist.  Activities of daily living and homemaking.  The patient performed bed mobility from a flat bed without rales, moderate assist for lifting and weight shifting.  Toilet transfers supervision, wheelchair to toilet with increased time and some hesitancy.  Needing some encouragement for dressing.  Speech Therapy follow up for ongoing dysphagia communication.  She could match objects and phrases from a field of 3 with supervision minimal assist.  Some perseveration on labeled items.  Full family teaching ongoing completed.  Plan, discharged to home with ongoing therapies dictated per [facility]. Her diet had been advanced prior to discharge to home to a dysphagia; honey liquids, however, maintained for nutritional support with tube feeds.  DISCHARGE MEDICATIONS: 1. Xanax 0.25 mg every 8 hours as needed and  0.5 mg Tuesdays,     Thursdays, and Saturdays with her dialysis. 2. Aspirin 325 mg p.o. daily. 3. Pulmicort nebulizer twice daily. 4. DuoNeb nebulizer t.i.d. 5. Keppra 500 mg p.o. b.i.d. 6. Synthroid 25 mcg p.o. daily. 7. Ritalin 10 mg twice daily by tube. 8. Remeron  15 mg at bedtime by tube. 9. Multivitamin daily. 10.Protonix 40 mg daily by tube.  DIET:  Dysphagia, honey-thick liquid.  She was receiving Nepro 85 mL for 14 hours by tube with 50 mL of water 3 times daily.  FOLLOWUP:  She would follow up Dr. Claudette Laws at the outpatient rehab service office January 12, 2015.  Dr. Delia Heady 1 month, call for appointment.  Dr. Delano Metz, hemodialysis.     Mariam Dollar, P.A.   ______________________________ Erick Colace, M.D.    DA/MEDQ  D:  12/23/2014  T:  12/23/2014  Job:  [id number]  cc:   Pramod P. Pearlean Brownie, MD Maree Krabbe, M.D. [name] L. August Saucer, M.D.

## 2014-12-23 NOTE — Progress Notes (Signed)
Physical Therapy Discharge Summary  Patient Details  Name: [name] MRN: [id number] Date of Birth: 1955/10/19  Today's Date: 12/23/2014 PT Individual Time: 1430-1530 PT Individual Time Calculation (min): 60 min    Patient has met 7 of 9 long term goals due to improved activity tolerance, improved balance, improved postural control, increased strength, ability to compensate for deficits, functional use of  right upper extremity and right lower extremity, improved attention, improved awareness and improved coordination.  Patient to discharge at an ambulatory level [place].   Patient's care partner is independent to provide the necessary physical and cognitive assistance at discharge. Daughter not present for family education until day before discharge, however provided education regarding all recommendations for safe mobility in the home.   Reasons goals not met: Transfer goal stand pivot w/c <>bed with supervision unmet; pt able to perform however inconsistent and occasionally requires CGA. Bed mobility goal unmet due to pt requiring modA for sit >supine to assist with LEs into bed. However daughter present for education and able to provide assist as needed.    Recommendation:  Patient will benefit from ongoing skilled PT services in home health setting to continue to advance safe functional mobility, address ongoing impairments in strength, balance, coordination, , and minimize fall risk.  Equipment: No equipment provided. Educated pt and family on use of w/c and hospital bed  for increased safety and independence however family declines due to space in small apartment.   Reasons for discharge: treatment goals met and discharge from hospital  Patient/family agrees with progress made and goals achieved: Yes  PT Discharge Precautions/Restrictions Precautions Precautions: Fall Precaution Comments: fall, NPO, non-verbal Restrictions Weight Bearing Restrictions: No Vital  Signs Therapy Vitals Temp: 97.8 F (36.6 C) Temp Source: Oral Pulse Rate: (!) 110 Resp: 19 BP: 109/81 mmHg Patient Position (if appropriate): Sitting Oxygen Therapy SpO2: 98 % O2 Device: Nasal Cannula O2 Flow Rate (L/min): 2 L/min Pain Pain Assessment Pain Assessment: Faces Faces Pain Scale: No hurt Vision/Perception  Vision - Assessment Eye Alignment: Within Functional Limits Ocular Range of Motion: Impaired-to be further tested in functional context Alignment/Gaze Preference: Chin down;Head turned;Gaze left Tracking/Visual Pursuits: Impaired - to be further tested in functional context  Cognition Overall Cognitive Status: Impaired/Different from baseline Arousal/Alertness: Awake/alert Orientation Level: Oriented to person;Oriented to place;Oriented to situation (difficult to assess, pt non-verbal however able to accurately respond yes/no with head shaking to orientation questions) Behaviors: Lability Safety/Judgment: Impaired Comments: decreased initiation, mild impulsivity 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 Sensation Coordination Coordination and Movement Description: decreased coordination BUE and BLE, 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 Finger Nose Finger Test: decreased accuracy with RUE Motor  Motor Motor: Motor impersistence;Motor perseverations (R hemiparesis) Motor - Discharge Observations: Improved functional use of RUE/RLE, motor impersistence/perseverations during functional tasks such as gait with turns, walking backwards to chair  Mobility Bed Mobility Bed Mobility: Sit to Supine;Supine to Sit Supine to Sit: 4: Min assist Supine to Sit Details: Verbal cues for sequencing;Verbal cues for technique;Visual cues/gestures for sequencing Sit to Supine: 3: Mod assist Sit to Supine: Patient Percentage: 70% Sit to Supine - Details: Visual cues/gestures for sequencing;Tactile cues for weight beaing;Tactile cues for posture;Tactile cues for weight shifting;Tactile cues for initiation;Tactile  cues for sequencing Transfers Transfers: Yes Stand Pivot Transfers: 4: Min guard Stand Pivot Transfer Details: Tactile cues for weight shifting;Tactile cues for posture;Verbal cues for sequencing;Verbal cues for technique Stand Pivot Transfer Details (indicate cue type and reason): visual/verbal cues for turning feet; tendency to freeze during  turn due to impaired motor planning and apraxia Locomotion  Ambulation Ambulation: Yes Ambulation/Gait Assistance: 5: Supervision Ambulation Distance (Feet): 170 Feet Assistive device: Other (Comment) (rollator) Ambulation/Gait Assistance Details: Verbal cues for precautions/safety;Verbal cues for technique;Verbal cues for sequencing Ambulation/Gait Assistance Details: cues for upright posture, maintaining rollator closer to body Gait Gait: Yes Gait Pattern: Impaired Gait Pattern: Step-to pattern;Decreased step length - right;Trunk flexed;Narrow base of support;Shuffle Stairs / Additional Locomotion Stairs: Yes Stairs Assistance: 4: Min guard Stair Management Technique: Two rails;Step to pattern Number of Stairs: 12 Height of Stairs: 3 Wheelchair Mobility Wheelchair Mobility: Yes Wheelchair Assistance: 4: Audiological scientist Details: Verbal cues for precautions/safety;Verbal cues for sequencing;Verbal cues for Marketing executive: Both upper extremities Wheelchair Parts Management: Needs assistance Distance: 75  Trunk/Postural Assessment  Cervical Assessment Cervical Assessment: Within Functional Limits Thoracic Assessment Thoracic Assessment: Within Functional Limits Lumbar Assessment Lumbar Assessment: Within Functional Limits Postural Control Postural Control: Deficits on evaluation Righting Reactions: decreased power production for stepping strategies and righting reactions  Balance Balance Balance Assessed: Yes Static Sitting Balance Static Sitting - Balance Support: No upper extremity supported Static  Sitting - Level of Assistance: 6: Modified independent (Device/Increase time) Static Sitting - Comment/# of Minutes: x2 min EOB Dynamic Sitting Balance Dynamic Sitting - Balance Support: Feet supported Dynamic Sitting - Level of Assistance: 5: Stand by assistance Dynamic Sitting Balance - Compensations: fearful of reaching outside BOS Static Standing Balance Static Standing - Balance Support: No upper extremity supported Static Standing - Level of Assistance: 5: Stand by assistance Static Standing - Comment/# of Minutes: x2 min EOB Dynamic Standing Balance Dynamic Standing - Balance Support: During functional activity;No upper extremity supported Dynamic Standing - Level of Assistance: 5: Stand by assistance Dynamic Standing - Balance Activities: Forward lean/weight shifting;Lateral lean/weight shifting Dynamic Standing - Comments: during transfers with no AD Extremity Assessment  RUE Assessment RUE Assessment: Exceptions to [facility] RUE Strength RUE Overall Strength Comments: shoulder flexion limited to approx 80 degrees, elbow and wrist WFL.  Pt strength grossly 2/5 LUE Assessment LUE Assessment: Exceptions to [facility] LUE Strength LUE Overall Strength Comments: shoulder flexion approx 100 degrees, elbow and wrist WFL, strength grossly 2-3/5 RLE Assessment RLE Assessment: Exceptions to [facility] RLE Strength RLE Overall Strength: Deficits RLE Overall Strength Comments: Grossly 4/5 throughout LLE Assessment LLE Assessment: Exceptions to [facility] LLE Strength LLE Overall Strength: Deficits LLE Overall Strength Comments: Grossly 4/5 throughout   Skilled Therapeutic Intervention: 0900-1000: Pt received seated in w/c, no c/o pain and agreeable to treatment. Notified through responses to simple yes/no questions that she needed to use restroom. Transfer w/c <>elevated BSC over toilet with CGA. Pt had incontinent bowel movement and required maxA to change clothing and perform hygiene. Pt assisted with  donning/doffing pants but unable to perform without assist. Pt performed sitting and standing at sink with O2 removed to perform self-care; O2 monitored throughout. Sit <>stand at sink with UEs and CGA; standing x3 min with supervision. Upon returning to seated position, O2 dropped from 98-100% to 84% within 1 min of sitting after prolonged standing, as therapist prepared to don [place], O2 continued to drop to 74%. Upon replacing [place] on 2L O2, O2 sat returned to 98% within 30 seconds. Educated pt on importance of supplemental oxygen, even when asymptomatic. Sitting rest break while RT gave pt breathing treatment. Gait training x160' with rollator and supervision. Min cues for encouragement and 2 short standing rest breaks due to fatigue. Pt returned to room and remained seated in  w/c with all needs in reach at completion of session.   1430-1530: Pt received seated in w/c with daughter present; no c/o pain and agreeable to treatment. Performed stair training with pt performing one trial of 12 stairs (combination of 6 and 3 inch stairs) with CGA. Two trials performed on 6" stair with daughter providing assist to simulate home environment; attempted performance of sideways ascent to accommodate for part of stairway at home which only has one rail. Second attempt performed with daughter providing HHA for LUE and pt demonstrates significant improvement in performance and safety. Car transfer with daughter providing minA; pt required cues for turning to sit on seat first as opposed to leading with LE into car first. Stand pivot transfer w/c <> bed in simulated apartment with CGA for transfer, modA for sit >supine due to difficulty getting LEs into bed, and minA supine>sit. Educated pt and daughter extensively regarding recommendations for mobility at home, elevating head in bed using wedge pillow, diet recommendations including thickened liquids and pureed foods to reiterate SLP recommendations, consistent use of O2 due to  pt desaturating on room air. Pt and daughter agreeable and state understanding. Pt returned to room and remained seated in w/c with QR belt intact and all needs within treatment.    See Function Navigator for Current Functional Status.  Benjiman Core Tygielski 12/23/2014, 3:45 PM

## 2014-12-23 NOTE — Plan of Care (Signed)
Problem: RH Expression Communication Goal: LTG Patient will verbally express basic/complex needs (SLP) LTG: Patient will verbally express basic/complex needs, wants or ideas with cues (SLP)  Outcome: Not Met (add Reason) Pt remains nonverbal

## 2014-12-23 NOTE — Progress Notes (Signed)
Social Work [name], female   DOB: February 27, 1956, 59 y.o.   MRN: [id number] Met with pt and daughter to discuss team conference goals meeting and pt's flucuates daily in her levels. Daughter is aware of this and here for education with PT and RN. Concern is needing a hospital bed for elevation and her special diet. Pt does not want to eat the pureed diet and honey thick liquids, which means she will exist on tube feedings. Have faxed [facility] referral in so can begin the services in the next few weeks. Have ordered tube feedings, home oxygen, drop-arm bedside commode and suction machine to deliver to home.

## 2014-12-23 NOTE — Progress Notes (Signed)
Occupational Therapy Discharge Summary  Patient Details  Name: Jupiter TIFFANIE BLASSINGAME MRN: [id number] Date of Birth: 1955-09-08  Patient has met 8 of 10 long term goals due to improved activity tolerance, improved balance, postural control, ability to compensate for deficits, functional use of  RIGHT upper extremity, improved attention, improved coordination and improved initiation.  Patient to discharge at overall Mod Assist level.  Patient's care partner has not been present for any OT session during this stay to complete hands on education regarding pt's self-care tasks, as pt will require physical and cognitive assistance at discharge.    Reasons goals not met: Pt continues to require total assist with LB dressing and toileting tasks secondary to apraxia, fear of falling, and overall decreased motivation.  Recommendation:  Patient will benefit from ongoing skilled OT services in home health setting to continue to advance functional skills in the area of BADL and Reduce care partner burden.  Equipment: drop arm BSC  Reasons for discharge: treatment goals met and discharge from hospital  Patient/family agrees with progress made and goals achieved: Yes  OT Discharge Precautions/Restrictions  Precautions Precautions: Fall Precaution Comments: fall, NPO, non-verbal Restrictions Weight Bearing Restrictions: No General   Vital Signs Oxygen Therapy O2 Device: Nasal Cannula O2 Flow Rate (L/min): 2 L/min Pain Pain Assessment Pain Assessment: Faces Faces Pain Scale: Hurts a little bit ADL   Vision/Perception  Vision- History Baseline Vision/History:  (unable to determine due to pt non-verbal and unable to report) Vision- Assessment Vision Assessment?: Vision impaired- to be further tested in functional context Eye Alignment: Within Functional Limits Ocular Range of Motion: Impaired-to be further tested in functional context Alignment/Gaze Preference: Chin down;Head turned;Gaze  left Tracking/Visual Pursuits: Impaired - to be further tested in functional context  Cognition Overall Cognitive Status: Impaired/Different from baseline Arousal/Alertness: Awake/alert Orientation Level: Oriented to person;Oriented to place;Oriented to situation (difficult to assess, pt non-verbal however able to accurately respond yes/no with head shaking to orientation questions) Behaviors: Lability Safety/Judgment: Impaired Comments: decreased initiation, mild impulsivity 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 Sensation Sensation Light Touch: Appears Intact Stereognosis: Not tested Hot/Cold: Not tested Proprioception: Not tested (unable to formally assess) Additional Comments: Unable to accurately asses due to difficulty with verbalizing response to assessment questions Coordination Gross Motor Movements are Fluid and Coordinated: No Fine Motor Movements are Fluid and Coordinated: No Coordination and Movement Description: decreased coordination BUE and BLE, 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 Finger Nose Finger Test: decreased accuracy with RUE Motor    Mobility     Trunk/Postural Assessment     Balance   Extremity/Trunk Assessment RUE Assessment RUE Assessment: Exceptions to [facility] RUE Strength RUE Overall Strength Comments: shoulder flexion limited to approx 80 degrees, elbow and wrist WFL.  Pt strength grossly 2/5 LUE Assessment LUE Assessment: Exceptions to [facility] LUE Strength LUE Overall Strength Comments: shoulder flexion approx 100 degrees, elbow and wrist WFL, strength grossly 2-3/5   See Function Navigator for Current Functional Status.  Simonne Come 12/23/2014, 12:07 PM

## 2014-12-23 NOTE — Discharge Summary (Signed)
Discharge summary job [id number]

## 2014-12-23 NOTE — Progress Notes (Signed)
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 ESRD -TTS [employer]. HD yesterday 3. Anemia - HGB 9.6 on ESA Therapy. Check FOBT, pending. Being loaded with Fe.  4. Secondary hyperparathyroidism - Ca 9.5 C Ca 10.4 Phos 3.5. Binders on hold, no calcitriol. Monitor Ca.  5. HTN/volume - HD yesterday. Net UF 1787 Post wt 72.1 kg. No Edema. HD tomorrow. Attempt 3 liters.  6. Nutrition - Albumin 3.6. Dysphagia 1 diet is talking small amt POs and continue TF per peg.  7. New Onset Hypoglycemia:  BS 55 last night. 15 G Carb snack. ^ 88 after snack. Patient dropped post HD. Need to check BS in HD and start TF at 1800 if pt is still in HD. Patient probably does not have large enough PO intake to support metabolic needs yet.  Disposition: DC summary written for tomorrow. Need to have HD in hospital if possible before DC  Rita H. Brown NP-C 12/23/2014, 10:48 AM  [facility] [phone]  Pt seen, examined and agree w A/P as above.  [name] MD pager [phone]    cell [phone] 12/23/2014, 12:02 PM    Subjective: Smiling.Up in chair. Shakes head negatively to questions about pain/SOB/NVD. Very pleasant.   Objective Filed Vitals:   12/22/14 2110 12/22/14 2131 12/22/14 2214 12/23/14 0543  BP: 103/66 106/79  91/70  Pulse: 112 81 97 107  Temp:  97.4 F (36.3 C)  98.8 F (37.1 C)  TempSrc:  Oral  Oral  Resp: Weight:    72.1 kg (158 lb 15.2 oz)  SpO2:  97% 97% 100%   Physical Exam General: Chronically ill appearing female in NAD Heart:S1,S2, No M/G/R Lungs: Bilateral breath sounds slightly decreased in bases. On O2. O2 sats drop on RA to 80s.  Abdomen: Soft nontender. PEG tube present LUQ.  Extremities: No edema. Dialysis Access: LUA AVF + thrill + Bruit  Dialysis Orders: TTS [place] 4h 2/2 [place] 91kg Heparin none Hect 2 ug Aranesp 200 ug/wk + venofer 50/wk   Additional  Objective Labs: Basic Metabolic Panel:  Recent Labs Lab 12/17/14 1337 12/22/14 1700  NA 137 134*  K 4.4 4.4  CL 94* 91*  CO2 27 27  GLUCOSE 81 73  BUN 59* 77*  CREATININE 6.16* 7.56*  CALCIUM 10.2 9.5  PHOS 3.5 4.2   Liver Function Tests:  Recent Labs Lab 12/17/14 1337 12/22/14 1700  ALBUMIN 3.4* 2.8*   No results for input(s): LIPASE, AMYLASE in the last 168 hours. CBC:  Recent Labs Lab 12/17/14 1337 12/19/14 1640 12/22/14 1700  WBC 8.8 8.0 7.7  NEUTROABS  --  4.2  --   HGB 11.3* 9.6* 8.7*  HCT 36.2 31.1* 28.5*  MCV 88.1 86.9 86.1  PLT 353 317 292   Blood Culture    Component Value Date/Time   SDES ABSCESS PELVIS 08/17/2014 1451   SPECREQUEST NONE 08/17/2014 1451   CULT  08/17/2014 1451    NO GROWTH 3 DAYS Performed at [facility]    REPTSTATUS 08/21/2014 FINAL 08/17/2014 1451    Cardiac Enzymes: No results for input(s): CKTOTAL, CKMB, CKMBINDEX, TROPONINI in the last 168 hours. CBG:  Recent Labs Lab 12/22/14 1147 12/22/14 2153 12/22/14 2237 12/23/14 0007 12/23/14 0604  GLUCAP 73 55* 88 130* 236*   Iron Studies: No results for input(s): IRON, TIBC, TRANSFERRIN, FERRITIN in the last 72 hours. @ Studies/Results: No results found. Medications: . feeding  supplement (NEPRO CARB STEADY) 1,000 mL (12/22/14 2127)   . ALPRAZolam  0.5 mg Per Tube Q T,Th,Sa-HD  . antiseptic oral rinse  7 mL Mouth Rinse TID  . aspirin  325 mg Oral Daily  . budesonide  0.25 mg Nebulization BID  . chlorhexidine  15 mL Mouth/Throat BID  . [START ON 12/24/2014] darbepoetin (ARANESP) injection - DIALYSIS  100 mcg Intravenous Q Thu-HD  . ferric gluconate (FERRLECIT/NULECIT) IV  125 mg Intravenous Q T,Th,Sa-HD  . free water  50 mL Per Tube TID  . guaiFENesin  200 mg Per Tube TID  . ipratropium-albuterol  3 mL Nebulization TID  . lanolin   Topical BID  . levETIRAcetam  500 mg Oral BID  . levothyroxine  25 mcg Oral QAC breakfast  . methylphenidate   10 mg Per Tube BID WC  . mirtazapine  15 mg Per Tube QHS  . multivitamin  1 tablet Oral QHS  . pantoprazole sodium  40 mg Per Tube Q1200

## 2014-12-23 NOTE — Patient Care Conference (Signed)
Inpatient RehabilitationTeam Conference and Plan of Care Update Date: 12/23/2014   Time: 11;20 AM    Patient Name: [name]      Medical Record Number: [id number]  Date of Birth: 06/27/1955 Sex: Female         Room/Bed: HD07C/HD07C Payor Info: Payor: MEDICAID [place] / Plan: MEDICAID  ACCESS / Product Type: *No Product type* /    Admitting Diagnosis: L MCA infarct  old cva  Admit Date/Time:  11/25/2014  4:48 PM Admission Comments: No comment available   Primary Diagnosis:  Left middle cerebral artery stroke Principal Problem: Left middle cerebral artery stroke  Patient Active Problem List   Diagnosis Date Noted  . Left middle cerebral artery stroke 11/25/2014  . Right hemiparesis 11/25/2014  . Aphasia due to stroke 11/25/2014  . Acute respiratory failure   . Cerebral infarction due to embolism of left middle cerebral artery   . Gastrostomy tube in place   . PEG (percutaneous endoscopic gastrostomy) status   . Respiratory distress   . DNAR (do not attempt resuscitation) 11/20/2014  . Paroxysmal a-fib 11/16/2014  . HLD (hyperlipidemia) 11/16/2014  . ESRD on dialysis 11/16/2014  . History of stroke 11/16/2014  . Essential hypertension   . PFO (patent foramen ovale)   . Acute respiratory failure with hypoxemia   . CVA (cerebral infarction) 11/10/2014  . Chronic ischemic colitis 09/25/2014  . Acute right ACA stroke 09/11/2014  . Left hemiparesis 09/11/2014  . History of ETT   . Duodenal fistula   . Sepsis   . Aortic aneurysm 08/13/2014  . Protein-calorie malnutrition   . Itching 08/12/2014  . Duodenal anastomotic leak   . Protein calorie malnutrition   . SVT (supraventricular tachycardia)   . Right heart failure   . ESRD (end stage renal disease)   . Abdominal pain   . Palliative care encounter   . 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 Fistula   . History of intermediate V/Q scan   . Other emphysema   . Intra-abdominal abscess   . Abnormal TSH    . Enteritis due to Clostridium difficile 07/19/2014  . Abdominal abscess   . Pelvic fluid collection   . Perforated gastric ulcer 07/01/2014  . SOB (shortness of breath)   . PJRT (permanent junctional reciprocating tachycardia)   . Paroxysmal atrial tachycardia   . PSVT (paroxysmal supraventricular tachycardia)   . Stroke   . Hyperlipidemia   . 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 Hypertension 06/20/2014  . ESRD needing dialysis 06/20/2014  . Obesity (BMI 30-39.9) 06/20/2014  . Depression 06/20/2014  . Left renal mass 06/20/2014  . Chronic diastolic heart failure 06/20/2014  . H1N1 influenza 06/20/2014  . Tobacco use disorder 06/20/2014  . ARF (acute renal failure)   . Renal failure (ARF), acute on chronic   . Acute respiratory failure with hypoxia 06/17/2014  . End stage renal disease 01/21/2014    Expected Discharge Date: Expected Discharge Date: 12/24/14  Team Members Present: Physician leading conference: Dr. Claudette Laws Social Worker Present: Dossie Der, LCSW Nurse Present: Carmie End, RN PT Present: Alyson Reedy, PT SLP Present: Jackalyn Lombard, SLP PPS Coordinator present : Tora Duck, RN, CRRN     Current Status/Progress Goal Weekly Team Focus  Medical   severe aphasia, ? decreasing   home with family asssit  increased po intake   Bowel/Bladder   incontionent of bowel and bladder  manage bowel and bladder with max assist  continued timed toileting  Swallow/Nutrition/ Hydration   Dys 1, honey thick liquids; poor PO intake due to not liking food  min assist   diet toleration, completion of family education prior to discharge as family is available    ADL's   min assist sit > stand and stand pivot transfers, min assist UB bathing and dressing, continues to require max-total assist for LB bathing and dressing due to decreased motivation and ability to follow tasks through to completion  mod assist bathing, LB dressing, toileting,  min assist grooming, UB dressing, toilet transfers  initiation, completion of tasks, BUE NMR< sit > stand, pt and family education   Mobility   minA/supervision bed mobility, transfers, gait x75-100' CGA, minA/CGA stairs  supervision bed mobility, transfers, gait; minA stairs  more consistent transfers with supervision, bed mobility from flat surface, activity tolerance   Communication   consistently answering basic yes/no questions and gestures to convey needs/wants, continues with more consistent vocalization but not able to produce specific phonemes   min-max assist   family education prior to discharge    Safety/Cognition/ Behavioral Observations  mod assist, mostly limited by decreased initiation and motor planning deficits in addition to limited sustained/selective attention to tasks   min-mod for basic   family education prior to discharge    Pain   faces pain scale, pt will nod when asked if in pain  pain equal to or less than 2 on faces scale  assess pain q4h and prn, medicatre as indicated   Skin   MASD to peri area, barrier cream applied, midline incision clean and dry, lanolin applied  no new skin injury/breakdown  assess skin q shift and prn, continue barier crean to butocks and lanolin to incision      *See Care Plan and progress notes for long and short-term goals.  Barriers to Discharge: still on PEG feeds, train family    Possible Resolutions to Barriers:  Family training with max HH    Discharge Planning/Teaching Needs:  Daughter coming on today to complete family education. Aware pt requires 24 hr care, needs to elarn pt's tube feeds      Team Discussion:  Making improvements toward her goals-of min assist level. Does tend to flucuate at times. Family education today with daughter. Dys 1 honey thick liquid diet along with tube feeds. Still incont bowel working on  Revisions to Treatment Plan:  Upgraded goals-min assist level   Continued Need for Acute  Rehabilitation Level of Care: The patient requires daily medical management by a physician with specialized training in physical medicine and rehabilitation for the following conditions: Daily direction of a multidisciplinary physical rehabilitation program to ensure safe treatment while eliciting the highest outcome that is of practical value to the patient.: Yes Daily medical management of patient stability for increased activity during participation in an intensive rehabilitation regime.: Yes Daily analysis of laboratory values and/or radiology reports with any subsequent need for medication adjustment of medical intervention for : Neurological problems;Other  Lucy Chris 12/24/2014, 10:48 AM

## 2014-12-23 NOTE — Progress Notes (Signed)
Speech Language Pathology Discharge Summary  Patient Details  Name: [name] MRN: [id number] Date of Birth: [birth year]  Today's Date: 12/23/2014 SLP Individual Time: 1005-1030 SLP Individual Time Calculation (min): 25 min   Skilled Therapeutic Interventions:  Pt was seen for skilled ST targeting dysphagia goals.  Upon arrival, pt was seated upright in wheelchair, awake, alert, and agreeable to participate in [place].   Per report, pt had not eaten much for breakfast this morning but she was agreeable to trials of applesauce, honey thick orange juice, and dys 2 trials of graham cracker pieces in applesauce.  No overt s/s of aspiration were evident with purees, dys 2 solids, or thickened liquids.  Recommend that pt remain on dys 1 solids and honey thick liquids at discharge with continued trials of advanced consistencies with speech therapy only to continue working towards diet progression.  Pt's daughter was not present for today's therapy session, CSW made aware.  Pt was left in wheelchair, with quick release belt donned and call bell within reach.      Patient has met 7 of 8 long term goals.  Patient to discharge at overall Mod;Max level.  Reasons goals not met: pt remains nonverbal    Clinical Impression/Discharge Summary:  Pt made functional gains and is discharging having met 7 out of 8 long term goals.  Pt has been initiated on a dys 1 diet with honey thick liquids per MBS.  Pt has demonstrated improved toleration of dys 2 trials and pt's solids may be advanced at bedside; however, strongly recommend a repeat MBS prior to liquids advancement due to the extent of pt's neuro deficits.  Pt remains nonverbal and globally aphasic but has demonstrated significant improvements in multimodal communication and yes/no responses to convey her needs and wants to caregivers.  Pt also continues to present with moderate cognitive deficits characterized by decreased intellectual/emergent awareness of deficits,  poor initiation, and decreased functional problem solving.  Pt is discharging home with 24/7 supervision from family; however, family education has been very limited while inpatient due to inconsistent family attendance/participation in therapies.  Recommend continued family education at next level of care. Also recommend additional ST follow up to maximize swallowing and cognitive-linguistic function.     Care Partner:  Caregiver Able to Provide Assistance: Yes  Type of Caregiver Assistance: Physical;Cognitive  Recommendation:  Home Health SLP;Outpatient SLP;24 hour supervision/assistance  Rationale for SLP Follow Up: Maximize functional communication;Maximize cognitive function and independence;Maximize swallowing safety;Reduce caregiver burden   Equipment: thickener    Reasons for discharge: Discharged from hospital   Patient/Family Agrees with Progress Made and Goals Achieved: Yes   Function:  Eating Eating   Modified Consistency Diet: Yes Eating Assist Level: Supervision or verbal cues;Set up assist for   Eating Set Up Assist For: Opening containers       Cognition Comprehension Comprehension assist level: Understands basic 50 - 74% of the time/ requires cueing 25 - 49% of the time  Expression   Expression assist level: Expresses basic 25 - 49% of the time/requires cueing 50 - 75% of the time. Uses single words/gestures.  Social Interaction Social Interaction assist level: Interacts appropriately 50 - 74% of the time - May be physically or verbally inappropriate.  Problem Solving Problem solving assist level: Solves basic 25 - 49% of the time - needs direction more than half the time to initiate, plan or complete simple activities  Memory Memory assist level: Recognizes or recalls 25 - 49% of the time/requires cueing  50 - 75% of the time   Emilio Math 12/23/2014, 3:53 PM

## 2014-12-24 LAB — CBC
HCT: 27.7 % — ABNORMAL LOW (ref 36.0–46.0)
Hemoglobin: 8.7 g/dL — ABNORMAL LOW (ref 12.0–15.0)
MCH: 27.8 pg (ref 26.0–34.0)
MCHC: 31.4 g/dL (ref 30.0–36.0)
MCV: 88.5 fL (ref 78.0–100.0)
Platelets: 259 K/uL (ref 150–400)
RBC: 3.13 MIL/uL — ABNORMAL LOW (ref 3.87–5.11)
RDW: 19.1 % — ABNORMAL HIGH (ref 11.5–15.5)
WBC: 6.4 10*3/uL (ref 4.0–10.5)

## 2014-12-24 LAB — RENAL FUNCTION PANEL
Albumin: 2.6 g/dL — ABNORMAL LOW (ref 3.5–5.0)
Anion gap: 10 (ref 5–15)
BUN: 41 mg/dL — ABNORMAL HIGH (ref 6–20)
CO2: 30 mmol/L (ref 22–32)
Calcium: 9 mg/dL (ref 8.9–10.3)
Chloride: 91 mmol/L — ABNORMAL LOW (ref 101–111)
Creatinine, Ser: 5.47 mg/dL — ABNORMAL HIGH (ref 0.44–1.00)
GFR calc Af Amer: 9 mL/min — ABNORMAL LOW (ref >60)
GFR calc non Af Amer: 8 mL/min — ABNORMAL LOW (ref >60)
Glucose, Bld: 93 mg/dL (ref 65–99)
Phosphorus: 4.6 mg/dL (ref 2.5–4.6)
Potassium: 3.9 mmol/L (ref 3.5–5.1)
Sodium: 131 mmol/L — ABNORMAL LOW (ref 135–145)

## 2014-12-24 LAB — GLUCOSE, CAPILLARY
GLUCOSE-CAPILLARY: 104 mg/dL — AB (ref 65–99)
GLUCOSE-CAPILLARY: 83 mg/dL (ref 65–99)
GLUCOSE-CAPILLARY: 99 mg/dL (ref 65–99)
Glucose-Capillary: 66 mg/dL (ref 65–99)

## 2014-12-24 MED ORDER — ALPRAZOLAM 0.5 MG PO TABS: ORAL_TABLET | ORAL | Status: DC

## 2014-12-24 MED ORDER — BUDESONIDE 0.25 MG/2ML IN SUSP: 0.2500 mg | Freq: Two times a day (BID) | RESPIRATORY_TRACT | Status: DC

## 2014-12-24 MED ORDER — PENTAFLUOROPROP-TETRAFLUOROETH EX AERO: 1.0000 "application " | INHALATION_SPRAY | CUTANEOUS | Status: DC | PRN

## 2014-12-24 MED ORDER — LEVOTHYROXINE SODIUM 25 MCG PO TABS: 25.0000 ug | ORAL_TABLET | Freq: Every day | ORAL | Status: DC

## 2014-12-24 MED ORDER — RENA-VITE PO TABS: 1.0000 | ORAL_TABLET | Freq: Every day | ORAL | Status: DC

## 2014-12-24 MED ORDER — HEPARIN SODIUM (PORCINE) 1000 UNIT/ML DIALYSIS
1000.0000 [IU] | INTRAMUSCULAR | Status: DC | PRN
  Filled 2014-12-24: qty 1

## 2014-12-24 MED ORDER — LIDOCAINE HCL (PF) 1 % IJ SOLN: 5.0000 mL | INTRAMUSCULAR | Status: DC | PRN

## 2014-12-24 MED ORDER — PANTOPRAZOLE SODIUM 40 MG PO PACK: 40.0000 mg | PACK | Freq: Every day | ORAL | Status: DC

## 2014-12-24 MED ORDER — SODIUM CHLORIDE 0.9 % IV SOLN: 100.0000 mL | INTRAVENOUS | Status: DC | PRN

## 2014-12-24 MED ORDER — NEPRO/CARBSTEADY PO LIQD: 1000.0000 mL | ORAL | Status: DC

## 2014-12-24 MED ORDER — LEVETIRACETAM 100 MG/ML PO SOLN: 500.0000 mg | Freq: Two times a day (BID) | ORAL | Status: DC

## 2014-12-24 MED ORDER — LIDOCAINE-PRILOCAINE 2.5-2.5 % EX CREA: 1.0000 "application " | TOPICAL_CREAM | CUTANEOUS | Status: DC | PRN

## 2014-12-24 MED ORDER — IPRATROPIUM-ALBUTEROL 0.5-2.5 (3) MG/3ML IN SOLN: 3.0000 mL | Freq: Three times a day (TID) | RESPIRATORY_TRACT | Status: DC

## 2014-12-24 MED ORDER — METHYLPHENIDATE HCL 10 MG PO TABS: 10.0000 mg | ORAL_TABLET | Freq: Two times a day (BID) | ORAL | Status: DC

## 2014-12-24 MED ORDER — GUAIFENESIN 100 MG/5ML PO SOLN: 200.0000 mg | Freq: Three times a day (TID) | ORAL | Status: DC

## 2014-12-24 MED ORDER — ASPIRIN 325 MG PO TABS: 325.0000 mg | ORAL_TABLET | Freq: Every day | ORAL | Status: DC

## 2014-12-24 MED ORDER — MIRTAZAPINE 15 MG PO TABS: 15.0000 mg | ORAL_TABLET | Freq: Every day | ORAL | Status: DC

## 2014-12-24 NOTE — Discharge Instructions (Signed)
Inpatient Rehab Discharge Instructions  [name] Discharge date and time: No discharge date for patient encounter.   Activities/Precautions/ Functional Status: Activity: activity as tolerated Diet: Dysphagia #1 honey liquids. Nepro 85 mL's 14 hours by tube with free water 50 mL 3 times daily Wound Care: keep wound clean and dry Functional status:  ___ No restrictions     ___ Walk up steps independently _x__ 24/7 supervision/assistance   ___ Walk up steps with assistance ___ Intermittent supervision/assistance  ___ Bathe/dress independently ___ Walk with walker     ___ Bathe/dress with assistance ___ Walk Independently    ___ Shower independently ___ Walk with assistance    ___ Shower with assistance ___ No alcohol     ___ Return to work/school ________  Special Instructions:  Continue dialysis as directed  COMMUNITY REFERRALS UPON DISCHARGE:    Home Health:   PT, OT, RN, SP  Agency:[facility] [phone]   Date of last service:12/24/2014  Medical Equipment/Items Ordered:HOME O2, YONKERS SUCTIONS MACHINE, TUBE FEEDINGS-NEPRO, DROP-ARM BEDSIDE COMMODE  Agency/Supplier:[facility]    [phone] Other:PCS REFERRAL MADE AND FAXED IN  GENERAL COMMUNITY RESOURCES FOR PATIENT/FAMILY: Support Groups:[facility] SUPPORT GROUP   STROKE/TIA DISCHARGE INSTRUCTIONS SMOKING Cigarette smoking nearly doubles your risk of having a stroke & is the single most alterable risk factor  If you smoke or have smoked in the last 12 months, you are advised to quit smoking for your health.  Most of the excess cardiovascular risk related to smoking disappears within a year of stopping.  Ask you doctor about anti-smoking medications  [place] Quit Line: 1-800-QUIT NOW  Free Smoking Cessation Classes [phone]  CHOLESTEROL Know your levels; limit fat & cholesterol in your diet  Lipid Panel     Component Value Date/Time   CHOL 135 11/11/2014 0330   TRIG 95 11/11/2014 0330   HDL  37* 11/11/2014 0330   CHOLHDL 3.6 11/11/2014 0330   VLDL 19 11/11/2014 0330   LDLCALC 79 11/11/2014 0330      Many patients benefit from treatment even if their cholesterol is at goal.  Goal: Total Cholesterol (CHOL) less than 160  Goal:  Triglycerides (TRIG) less than 150  Goal:  HDL greater than 40  Goal:  LDL (LDLCALC) less than 100   BLOOD PRESSURE American Stroke Association blood pressure target is less that 120/80 mm/Hg  Your discharge blood pressure is:  BP: 106/76 mmHg  Monitor your blood pressure  Limit your salt and alcohol intake  Many individuals will require more than one medication for high blood pressure  DIABETES (A1c is a blood sugar average for last 3 months) Goal HGBA1c is under 7% (HBGA1c is blood sugar average for last 3 months)  Diabetes: No known diagnosis of diabetes    Lab Results  Component Value Date   HGBA1C 4.8 11/11/2014     Your HGBA1c can be lowered with medications, healthy diet, and exercise.  Check your blood sugar as directed by your physician  Call your physician if you experience unexplained or low blood sugars.  PHYSICAL ACTIVITY/REHABILITATION Goal is 30 minutes at least 4 days per week  Activity: Increase activity slowly, Therapies: Physical Therapy: Home Health Return to work:   Activity decreases your risk of heart attack and stroke and makes your heart stronger.  It helps control your weight and blood pressure; helps you relax and can improve your mood.  Participate in a regular exercise program.  Talk with your doctor about the best form  of exercise for you (dancing, walking, swimming, cycling).  DIET/WEIGHT Goal is to maintain a healthy weight  Your discharge diet is: Diet NPO time specified  liquids Your height is:    Your current weight is: Weight: 88.8 kg (195 lb 12.3 oz) Your Body Mass Index (BMI) is:     Following the type of diet specifically designed for you will help prevent another stroke.  Your goal weight  range is:    Your goal Body Mass Index (BMI) is 19-24.  Healthy food habits can help reduce 3 risk factors for stroke:  High cholesterol, hypertension, and excess weight.  RESOURCES Stroke/Support Group:  Call [phone]   STROKE EDUCATION PROVIDED/REVIEWED AND GIVEN TO PATIENT Stroke warning signs and symptoms How to activate emergency medical system (call 911). Medications prescribed at discharge. Need for follow-up after discharge. Personal risk factors for stroke. Pneumonia vaccine given:  Flu vaccine given:  My questions have been answered, the writing is legible, and I understand these instructions.  I will adhere to these goals & educational materials that have been provided to me after my discharge from the hospital.      My questions have been answered and I understand these instructions. I will adhere to these goals and the provided educational materials after my discharge from the hospital.  Patient/Caregiver Signature _______________________________ Date __________  Clinician Signature _______________________________________ Date __________  Please bring this form and your medication list with you to all your follow-up doctor's appointments.

## 2014-12-24 NOTE — Procedures (Signed)
  I was present at this dialysis session, have reviewed the session itself and made  appropriate changes Stephanie Moselle MD (pgr) [phone]    ([phone] 12/24/2014, 12:34 PM

## 2014-12-24 NOTE — Progress Notes (Signed)
Social Work Discharge Note Discharge Note  The overall goal for the admission was met for:   Discharge location: Yes-HOME WITH DAUGHTER AND SON-24 HR CARE  Length of Stay: Yes-29 DAYS  Discharge activity level: Yes-MIN/MOD LEVEL  Home/community participation: Yes  Services provided included: MD, RD, PT, OT, SLP, RN, CM, TR, Pharmacy and SW  Financial Services: Medicaid  Follow-up services arranged: Home Health: [place], DME: [facility]-DROP-ARM BEDSIDE COMMODE, HOME O2, YOUNKERS SUCTION MACHINE, NEPRO TUBE FEEDS and Patient/Family request agency HH: ACTIVE PT WITH [facility], DME: PREF [facility]  Comments (or additional information):FAMILY EDUCATION WITH [name], [name]. AWARE PT REQUIRES 24 HR CARE. CONCERNED WITH PT'S AMOUNT OF CARE SHE REQUIRES AND FAMILY'S UNDERSTANDING OF HER CARE, ALLOW PT TO DICTATE HER WANTS AND GIVE IN TO HER EVEN IF AGAINST TEAM'S RECOMMENDATIONS. FOR EXAMPLE FOOD CHOICES AND CONSISTENCIES. PT WANTS TO GO HOME AND WILL NOT CONSIDER OTHER OPTIONS. PCS REFERRAL MADE AND WILL PROVIDE 3-4 HOURS OF CARE DURING THE WEEK  Patient/Family verbalized understanding of follow-up arrangements: Yes  Individual responsible for coordination of the follow-up plan: [name]-DAUGHTER  Confirmed correct DME delivered: [name] 12/24/2014    [name]

## 2014-12-24 NOTE — Progress Notes (Signed)
Family at bedside. Pt given discharge instructions. Pt discharged to home with daughter and son.

## 2014-12-25 ENCOUNTER — Telehealth: Payer: Self-pay | Admitting: Physical Medicine & Rehabilitation

## 2014-12-25 NOTE — Telephone Encounter (Signed)
[facility] pharmacy called about prescription Protonix--needs clarification--please call [phone]

## 2014-12-26 MED ORDER — ALBUTEROL SULFATE (2.5 MG/3ML) 0.083% IN NEBU: 2.5000 mg | INHALATION_SOLUTION | Freq: Four times a day (QID) | RESPIRATORY_TRACT | Status: DC | PRN

## 2014-12-26 NOTE — Telephone Encounter (Signed)
Daughter called the hospital saying inhaler was not adequate. Pt having wheezing and shortness of breath. Nebulizer works better. Will send in rx for albuterol [facility]N to [facility] pharmacy

## 2014-12-29 NOTE — Progress Notes (Signed)
Social Work [name], female   DOB: 1955-04-25, 59 y.o.   MRN: [id number] Ordered placed for home nebulizer machine via [facility].  Contacted daughter-[name] who will go to the [facility] retail  Store Monday 9/26.  Have contacted retail store have prescription and will await daughter's arrival.

## 2014-12-31 ENCOUNTER — Telehealth: Payer: Self-pay | Admitting: *Deleted

## 2014-12-31 NOTE — Telephone Encounter (Signed)
[name] called to request that SN be added to [facility] team as she has a lot of needs right now with medications and education.  Approval given.

## 2015-01-08 DIAGNOSIS — I120 Hypertensive chronic kidney disease with stage 5 chronic kidney disease or end stage renal disease: Secondary | ICD-10-CM | POA: Diagnosis not present

## 2015-01-08 DIAGNOSIS — G40909 Epilepsy, unspecified, not intractable, without status epilepticus: Secondary | ICD-10-CM

## 2015-01-08 DIAGNOSIS — Z992 Dependence on renal dialysis: Secondary | ICD-10-CM | POA: Diagnosis not present

## 2015-01-08 DIAGNOSIS — Z4682 Encounter for fitting and adjustment of non-vascular catheter: Secondary | ICD-10-CM

## 2015-01-08 DIAGNOSIS — D649 Anemia, unspecified: Secondary | ICD-10-CM

## 2015-01-08 DIAGNOSIS — F419 Anxiety disorder, unspecified: Secondary | ICD-10-CM

## 2015-01-08 DIAGNOSIS — I69351 Hemiplegia and hemiparesis following cerebral infarction affecting right dominant side: Secondary | ICD-10-CM | POA: Diagnosis not present

## 2015-01-08 DIAGNOSIS — N186 End stage renal disease: Secondary | ICD-10-CM | POA: Diagnosis not present

## 2015-01-12 ENCOUNTER — Inpatient Hospital Stay: Payer: Medicaid Other | Admitting: Physical Medicine & Rehabilitation

## 2015-01-12 ENCOUNTER — Encounter: Payer: Medicaid Other | Attending: Physical Medicine & Rehabilitation

## 2015-02-03 ENCOUNTER — Telehealth: Payer: Self-pay | Admitting: *Deleted

## 2015-02-03 ENCOUNTER — Other Ambulatory Visit: Payer: Self-pay | Admitting: *Deleted

## 2015-02-03 DIAGNOSIS — R1310 Dysphagia, unspecified: Secondary | ICD-10-CM

## 2015-02-03 NOTE — Telephone Encounter (Signed)
Allen DerryAllison Mallory, [facility], Speech Therapist is asking us to get pt a referral for a modified barium swallow study to objectively determine any gains the pt may have made regarding her swallow function

## 2015-02-03 NOTE — Telephone Encounter (Signed)
Speech therapist?

## 2015-02-04 NOTE — Telephone Encounter (Signed)
Spoke with ConAgra Foodsallison. Gave order for additional speech therapy. [facility]

## 2015-02-04 NOTE — Telephone Encounter (Signed)
[name] is also requesting more visits with ST along with the order for the modified barium swallow.  Please call her at [phone] with any questions.

## 2015-02-11 ENCOUNTER — Telehealth: Payer: Self-pay | Admitting: Physical Medicine & Rehabilitation

## 2015-02-11 DIAGNOSIS — R1310 Dysphagia, unspecified: Secondary | ICD-10-CM

## 2015-02-11 NOTE — Telephone Encounter (Signed)
[name] would like to know the status of the Modified Barium Swallow test referral.  Has not heard or gotten a referral, please call her at [phone].

## 2015-02-11 NOTE — Telephone Encounter (Signed)
Called Revonda Standardllison back and informed that referral was in place...left message on confidential voice mail

## 2015-02-12 ENCOUNTER — Telehealth: Payer: Self-pay | Admitting: Physical Medicine & Rehabilitation

## 2015-02-12 NOTE — Telephone Encounter (Signed)
Verbal order given. [facility]

## 2015-02-12 NOTE — Telephone Encounter (Signed)
LM for [facility]Allison Mallory [facility]

## 2015-02-12 NOTE — Telephone Encounter (Signed)
Stephanie DerryAllison [name] ST with [facility] needs to recertification for ST and also a verbal order to send out a social worker, please call her at [phone].

## 2015-02-18 ENCOUNTER — Other Ambulatory Visit (HOSPITAL_COMMUNITY): Payer: Self-pay | Admitting: Physical Medicine & Rehabilitation

## 2015-02-18 ENCOUNTER — Telehealth: Payer: Self-pay | Admitting: Physical Medicine & Rehabilitation

## 2015-02-18 DIAGNOSIS — R1310 Dysphagia, unspecified: Secondary | ICD-10-CM

## 2015-02-18 NOTE — Telephone Encounter (Signed)
Revonda StandardAllison ST with [facility] would like to recert patient to work on swallowing.  Please call her at [phone].

## 2015-02-18 NOTE — Telephone Encounter (Signed)
Called back, gave verbal orders per office protocol

## 2015-02-23 ENCOUNTER — Emergency Department (HOSPITAL_COMMUNITY)
Admission: EM | Admit: 2015-02-23 | Discharge: 2015-02-23 | Disposition: A | Discharge status: Home / Self Care | Payer: Medicare Other | Attending: Emergency Medicine | Admitting: Emergency Medicine

## 2015-02-23 ENCOUNTER — Emergency Department (HOSPITAL_COMMUNITY): Payer: Medicare Other

## 2015-02-23 ENCOUNTER — Encounter (HOSPITAL_COMMUNITY): Payer: Self-pay

## 2015-02-23 DIAGNOSIS — M1990 Unspecified osteoarthritis, unspecified site: Secondary | ICD-10-CM | POA: Diagnosis not present

## 2015-02-23 DIAGNOSIS — J449 Chronic obstructive pulmonary disease, unspecified: Secondary | ICD-10-CM | POA: Insufficient documentation

## 2015-02-23 DIAGNOSIS — Z79899 Other long term (current) drug therapy: Secondary | ICD-10-CM | POA: Insufficient documentation

## 2015-02-23 DIAGNOSIS — Q211 Atrial septal defect: Secondary | ICD-10-CM | POA: Diagnosis not present

## 2015-02-23 DIAGNOSIS — G4700 Insomnia, unspecified: Secondary | ICD-10-CM | POA: Insufficient documentation

## 2015-02-23 DIAGNOSIS — F17210 Nicotine dependence, cigarettes, uncomplicated: Secondary | ICD-10-CM | POA: Insufficient documentation

## 2015-02-23 DIAGNOSIS — N189 Chronic kidney disease, unspecified: Secondary | ICD-10-CM | POA: Diagnosis not present

## 2015-02-23 DIAGNOSIS — I471 Supraventricular tachycardia: Secondary | ICD-10-CM

## 2015-02-23 DIAGNOSIS — Z7982 Long term (current) use of aspirin: Secondary | ICD-10-CM | POA: Diagnosis not present

## 2015-02-23 DIAGNOSIS — F329 Major depressive disorder, single episode, unspecified: Secondary | ICD-10-CM | POA: Diagnosis not present

## 2015-02-23 DIAGNOSIS — N390 Urinary tract infection, site not specified: Secondary | ICD-10-CM | POA: Diagnosis not present

## 2015-02-23 DIAGNOSIS — I129 Hypertensive chronic kidney disease with stage 1 through stage 4 chronic kidney disease, or unspecified chronic kidney disease: Secondary | ICD-10-CM | POA: Diagnosis not present

## 2015-02-23 DIAGNOSIS — Z8673 Personal history of transient ischemic attack (TIA), and cerebral infarction without residual deficits: Secondary | ICD-10-CM | POA: Diagnosis not present

## 2015-02-23 DIAGNOSIS — I499 Cardiac arrhythmia, unspecified: Secondary | ICD-10-CM | POA: Diagnosis present

## 2015-02-23 LAB — URINALYSIS, ROUTINE W REFLEX MICROSCOPIC
Bilirubin Urine: NEGATIVE
Glucose, UA: NEGATIVE mg/dL
Ketones, ur: NEGATIVE mg/dL
Nitrite: NEGATIVE
PROTEIN: 100 mg/dL — AB
Specific Gravity, Urine: 1.006 (ref 1.005–1.030)
pH: 8.5 — ABNORMAL HIGH (ref 5.0–8.0)

## 2015-02-23 LAB — BASIC METABOLIC PANEL
Anion gap: 11 (ref 5–15)
BUN: 9 mg/dL (ref 6–20)
CHLORIDE: 101 mmol/L (ref 101–111)
CO2: 30 mmol/L (ref 22–32)
CREATININE: 3.75 mg/dL — AB (ref 0.44–1.00)
Calcium: 9 mg/dL (ref 8.9–10.3)
GFR calc Af Amer: 14 mL/min — ABNORMAL LOW (ref >60)
GFR calc non Af Amer: 12 mL/min — ABNORMAL LOW (ref >60)
GLUCOSE: 73 mg/dL (ref 65–99)
Potassium: 3.5 mmol/L (ref 3.5–5.1)
Sodium: 142 mmol/L (ref 135–145)

## 2015-02-23 LAB — CBC
HEMATOCRIT: 29.2 % — AB (ref 36.0–46.0)
Hemoglobin: 9.1 g/dL — ABNORMAL LOW (ref 12.0–15.0)
MCH: 28.3 pg (ref 26.0–34.0)
MCHC: 31.2 g/dL (ref 30.0–36.0)
MCV: 91 fL (ref 78.0–100.0)
PLATELETS: 227 10*3/uL (ref 150–400)
RBC: 3.21 MIL/uL — ABNORMAL LOW (ref 3.87–5.11)
RDW: 16.9 % — AB (ref 11.5–15.5)
WBC: 4.9 10*3/uL (ref 4.0–10.5)

## 2015-02-23 LAB — URINE MICROSCOPIC-ADD ON

## 2015-02-23 LAB — I-STAT TROPONIN, ED: Troponin i, poc: 0.02 ng/mL (ref 0.00–0.08)

## 2015-02-23 MED ORDER — PREDNISONE 20 MG PO TABS
40.0000 mg | ORAL_TABLET | Freq: Once | ORAL | Status: AC
  Administered 2015-02-23: 40 mg via ORAL
  Filled 2015-02-23: qty 2

## 2015-02-23 MED ORDER — IPRATROPIUM-ALBUTEROL 0.5-2.5 (3) MG/3ML IN SOLN
3.0000 mL | Freq: Once | RESPIRATORY_TRACT | Status: AC
  Administered 2015-02-23: 3 mL via RESPIRATORY_TRACT
  Filled 2015-02-23: qty 3

## 2015-02-23 MED ORDER — SODIUM CHLORIDE 0.9 % IV BOLUS (SEPSIS)
500.0000 mL | Freq: Once | INTRAVENOUS | Status: AC
  Administered 2015-02-23: 500 mL via INTRAVENOUS

## 2015-02-23 MED ORDER — DILTIAZEM HCL 25 MG/5ML IV SOLN
20.0000 mg | Freq: Once | INTRAVENOUS | Status: AC
  Administered 2015-02-23: 20 mg via INTRAVENOUS
  Filled 2015-02-23: qty 5

## 2015-02-23 MED ORDER — PREDNISONE 20 MG PO TABS: ORAL_TABLET | ORAL | Status: DC

## 2015-02-23 MED ORDER — IPRATROPIUM-ALBUTEROL 0.5-2.5 (3) MG/3ML IN SOLN
3.0000 mL | RESPIRATORY_TRACT | Status: AC
Start: 2015-02-23 — End: 2015-02-23
  Administered 2015-02-23 (×2): 3 mL via RESPIRATORY_TRACT
  Filled 2015-02-23 (×2): qty 3

## 2015-02-23 MED ORDER — CEFPODOXIME PROXETIL 100 MG PO TABS: 100.0000 mg | ORAL_TABLET | Freq: Two times a day (BID) | ORAL | Status: DC

## 2015-02-23 NOTE — ED Provider Notes (Signed)
CSN: [id number]     Arrival date & time 02/23/15  1200 History   First MD Initiated Contact with Patient 02/23/15 1208     Chief Complaint  Patient presents with  . Irregular Heart Beat     (Consider location/radiation/quality/duration/timing/severity/associated sxs/prior Treatment) Patient is a 59 y.o. female presenting with general illness. The history is provided by the patient.  Illness Severity:  Mild Onset quality:  Sudden Duration:  1 day Timing:  Constant Progression:  Unchanged Chronicity:  New Associated symptoms: no chest pain, no congestion, no fever, no headaches, no myalgias, no nausea, no rhinorrhea, no shortness of breath, no vomiting and no wheezing     59 yo F with a chief complaint of tachycardia. Patient was asymptomatic had dialysis today was noted to be an irregular fast rhythm. Patient never had an issue like this before and she was transferred to the emergency department. Patient denies cough congestion fevers. Denies any areas of pain. Has had some intermittent dysuria. Denies flank pain.  Past Medical History  Diagnosis Date  . Asthma   . Hypertension   . Gout   . Arthritis   . Insomnia   . Chronic kidney disease   . Depression   . Renal insufficiency   . Aortic aneurysm without rupture (HCC)     3cm by CT 08/03/14  . Paroxysmal SVT (supraventricular tachycardia) (HCC)   . Cor pulmonale (HCC)   . Stroke (HCC) 06/2014  . PFO (patent foramen ovale)   . COPD (chronic obstructive pulmonary disease) [facility](HCC)    Past Surgical History  Procedure Laterality Date  . Multiple tooth extractions    . Av fistula placement Left 01/28/2014    Procedure: ARTERIOVENOUS (AV) FISTULA CREATION;  Surgeon: Sherren Kernsharles E Fields, MD;  [facility];  Service: Vascular;  Laterality: Left;  . Tee without cardioversion N/A 06/23/2014    Procedure: TRANSESOPHAGEAL ECHOCARDIOGRAM (TEE);  Surgeon: Lewayne BuntingBrian S Crenshaw, MD;  [facility];  Service: Cardiovascular;  Laterality:  N/A;  . Insertion of dialysis catheter Right 06/27/2014    Procedure: INSERTION OF Right Internal Jugular DIATEK CATHETER;  Surgeon: Pryor OchoaJames D Lawson, MD;  [facility];  Service: Vascular;  Laterality: Right;  . Laparotomy N/A 07/01/2014    Procedure: EXPLORATORY LAPAROTOMY WITH CLOSURE OF PERFORATED PYLORIC ULCER;  Surgeon: Claud KelpHaywood Ingram, MD;  Location: [facility] OR;  Service: General;  Laterality: N/A;  . Bowel resection N/A 07/01/2014    Procedure: SMALL BOWEL RESECTION;  Surgeon: Claud KelpHaywood Ingram, MD;  [facility];  Service: General;  Laterality: N/A;  . 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 Diabetes Mother   . Hypertension Mother   . Heart disease Mother   . Heart attack Mother   . Peripheral vascular disease Mother   . Diabetes Father   . Heart disease Father   . Hypertension Father   . Diabetes Sister   . Hypertension Sister   . Heart disease Sister     before age 59  . Heart attack Sister   . Peripheral vascular disease Sister   . Heart disease Brother   . Hyperlipidemia Daughter    Social History  Substance Use Topics  . Smoking status: Current Every Day Smoker -- 0.50  packs/day for 20 years    Types: Cigarettes  . Smokeless tobacco: Never Used  . Alcohol Use: Yes     Comment: occ   OB History    No data available     Review of Systems  Constitutional: Negative for fever and chills.  HENT: Negative for congestion and rhinorrhea.   Eyes: Negative for redness and visual disturbance.  Respiratory: Negative for shortness of breath and wheezing.   Cardiovascular: Negative for chest pain and palpitations.  Gastrointestinal: Negative for nausea and vomiting.  Genitourinary: Negative for dysuria and urgency.   Musculoskeletal: Negative for myalgias and arthralgias.  Skin: Negative for pallor and wound.  Neurological: Negative for dizziness and headaches.      Allergies  Review of patient's allergies indicates no known allergies.  Home Medications   Prior to Admission medications   Medication Sig Start Date End Date Taking? Authorizing Provider  albuterol (PROVENTIL) (2.5 MG/3ML) 0.083% nebulizer solution Take 3 mLs (2.5 mg total) by nebulization every 6 (six) hours as needed for wheezing or shortness of breath. 12/26/14  Yes Ranelle Oyster, MD  ALPRAZolam Prudy Feeler) 0.5 MG tablet Xanax 0.25 mg every 8 hours as needed and 0.5 mg every Tuesday Thursday Saturday with dialysis 12/24/14  Yes Daniel J Angiulli, PA-C  aspirin 325 MG tablet Take 1 tablet (325 mg total) by mouth daily. 12/24/14  Yes Daniel J Angiulli, PA-C  budesonide (PULMICORT) 0.25 MG/2ML nebulizer solution Take 2 mLs (0.25 mg total) by nebulization 2 (two) times daily. 12/24/14  Yes Daniel J Angiulli, PA-C  guaiFENesin (ROBITUSSIN) 100 MG/5ML SOLN Place 10 mLs (200 mg total) into feeding tube 3 (three) times daily. Patient taking differently: Take 200 mg by mouth every 4 (four) hours as needed for cough.  12/24/14  Yes Daniel J Angiulli, PA-C  ipratropium-albuterol (DUONEB) 0.5-2.5 (3) MG/3ML SOLN Take 3 mLs by nebulization 3 (three) times daily. 12/24/14  Yes Daniel J Angiulli, PA-C  levETIRAcetam (KEPPRA) 100 MG/ML solution Take 5 mLs (500 mg total) by mouth 2 (two) times daily. 12/24/14  Yes Daniel J Angiulli, PA-C  levothyroxine (SYNTHROID, LEVOTHROID) 25 MCG tablet Take 1 tablet (25 mcg total) by mouth daily before breakfast. 12/24/14  Yes Mcarthur Rossetti Angiulli, PA-C  methylphenidate (RITALIN) 10 MG tablet Place 1 tablet (10 mg total) into feeding tube 2 (two) times daily with breakfast and lunch. Patient taking differently: Take 10 mg by mouth 2 (two) times daily with breakfast and lunch.  12/24/14  Yes Mcarthur Rossetti Angiulli, PA-C  mirtazapine  (REMERON) 15 MG tablet Place 1 tablet (15 mg total) into feeding tube at bedtime. Patient taking differently: Take 15 mg by mouth at bedtime.  12/24/14  Yes Daniel J Angiulli, PA-C  multivitamin (RENA-VIT) TABS tablet Take 1 tablet by mouth at bedtime. 12/24/14  Yes Daniel J Angiulli, PA-C  Nutritional Supplements (FEEDING SUPPLEMENT, NEPRO CARB STEADY,) LIQD Place 1,000 mLs into feeding tube continuous. 12/24/14  Yes Daniel J Angiulli, PA-C  pantoprazole (PROTONIX) 40 MG tablet Take 40 mg by mouth daily.   Yes Historical Provider, MD  vitamin C (VITAMIN C) 250 MG tablet Take 1 tablet (250 mg total) by mouth daily. 10/02/14  Yes Daniel J Angiulli, PA-C  cefpodoxime (VANTIN) 100 MG tablet Take 1 tablet (100 mg total) by mouth 2 (two) times daily. 02/23/15   Melene Plan, DO  collagenase (SANTYL) ointment Apply topically daily. As directed Patient not taking: Reported on 02/23/2015 10/06/14   Mcarthur Rossetti Angiulli, PA-C  pantoprazole sodium (PROTONIX)  40 mg/20 mL PACK Place 20 mLs (40 mg total) into feeding tube daily at 12 noon. Patient not taking: Reported on 02/23/2015 12/24/14   Mcarthur Rossetti Angiulli, PA-C  predniSONE (DELTASONE) 20 MG tablet 2 tabs po daily x 4 days 02/23/15   Melene Plan, DO   BP 152/98 mmHg  Pulse 118  Temp(Src) 98.8 F (37.1 C) (Oral)  Resp 20  SpO2 98% Physical Exam  Constitutional: She is oriented to person, place, and time. She appears well-developed and well-nourished. No distress.  HENT:  Head: Normocephalic and atraumatic.  Eyes: EOM are normal. Pupils are equal, round, and reactive to light.  Neck: Normal range of motion. Neck supple.  Cardiovascular: An irregular rhythm present. Tachycardia present.  Exam reveals no gallop and no friction rub.   No murmur heard. Pulmonary/Chest: Effort normal. She has no wheezes. She has no rales.  Abdominal: Soft. She exhibits no distension. There is no tenderness.  Musculoskeletal: She exhibits no edema or tenderness.  Neurological: She is  alert and oriented to person, place, and time.  Skin: Skin is warm and dry. She is not diaphoretic.  Psychiatric: She has a normal mood and affect. Her behavior is normal.  Nursing note and vitals reviewed.   ED Course  Procedures (including critical care time) Labs Review Labs Reviewed  BASIC METABOLIC PANEL - Abnormal; Notable for the following:    Creatinine, Ser 3.75 (*)    GFR calc non Af Amer 12 (*)    GFR calc Af Amer 14 (*)    All other components within normal limits  CBC - Abnormal; Notable for the following:    RBC 3.21 (*)    Hemoglobin 9.1 (*)    HCT 29.2 (*)    RDW 16.9 (*)    All other components within normal limits  URINALYSIS, ROUTINE W REFLEX MICROSCOPIC (NOT AT [facility]) - Abnormal; Notable for the following:    APPearance HAZY (*)    pH 8.5 (*)    Hgb urine dipstick MODERATE (*)    Protein, ur 100 (*)    Leukocytes, UA TRACE (*)    All other components within normal limits  URINE MICROSCOPIC-ADD ON - Abnormal; Notable for the following:    Squamous Epithelial / LPF 6-30 (*)    Bacteria, UA MANY (*)    All other components within normal limits  I-STAT TROPOININ, ED    Imaging Review Dg Chest 2 View  02/23/2015  CLINICAL DATA:  Shortness of breath.  Tachycardia. EXAM: CHEST  2 VIEW COMPARISON:  12/12/2014 FINDINGS: Prominent retrocardiac markings but no focal opacity in the lateral projection. There is no edema, effusion, or pneumothorax. Chronic cardiomegaly with stable aortic and hilar contour. Pulmonary venous congestion without edema. Large lung volumes, correlating with COPD history. IMPRESSION: 1. No acute finding. 2. Cardiomegaly and pulmonary venous congestion. Electronically Signed   By: Marnee Spring M.D.   On: 02/23/2015 13:21   I have personally reviewed and evaluated these images and lab results as part of my medical decision-making.   EKG Interpretation   Date/Time:  Tuesday February 23 2015 14:24:13 EST Ventricular Rate:  96 PR Interval:   148 QRS Duration: 89 QT Interval:  368 QTC Calculation: 465 R Axis:   93 Text Interpretation:  Multifocal atrial tachycardia Otherwise no  significant change Reconfirmed by Denae Zulueta MD, DANIEL ([id number]) on 02/24/2015  9:05:58 AM      MDM   Final diagnoses:  UTI (lower urinary tract infection)  Multifocal atrial tachycardia (  HCC)    59 yo F with a chief complaints of irregular tachycardia. Initial EKG with P waves appears to be a sinus arrhythmia. Patient was given 2 boluses of Cardizem with some mild improvement of tachycardia. Able to visualize patient with multiple different P waves at differing morphology. Consistent with multifocal atrial tachycardia. Patient continues to be a symptomatically on the ED. Denies any increasing shortness of breath over the past couple weeks. We'll give breathing treatments. Steroids. Found to have uti on UA.  Concern with continued tachycardia and possible source of infection. Discussed inpatient admission versus home therapy. Patient continuing to be asymptomatic feels that she would be safe at home. Feel unlikely a PE with no noted chest pain or shortness of breath. Will treat as a COPD exacerbation have her follow with her doctor tomorrow or the next day.   I have discussed the diagnosis/risks/treatment options with the patient and family and believe the pt to be eligible for discharge home to follow-up with PCP. We also discussed returning to the ED immediately if new or worsening sx occur. We discussed the sx which are most concerning (e.g., sudden worsening pain, fever, inability to tolerate by mouth) that necessitate immediate return. Medications administered to the patient during their visit and any new prescriptions provided to the patient are listed below.  Medications given during this visit Medications  sodium chloride 0.9 % bolus 500 mL (0 mLs Intravenous Stopped 02/23/15 1300)  diltiazem (CARDIZEM) injection 20 mg (20 mg Intravenous Given 02/23/15  1238)  diltiazem (CARDIZEM) injection 20 mg (20 mg Intravenous Given 02/23/15 1410)  ipratropium-albuterol (DUONEB) 0.5-2.5 (3) MG/3ML nebulizer solution 3 mL (3 mLs Nebulization Given 02/23/15 1411)  sodium chloride 0.9 % bolus 500 mL (0 mLs Intravenous Stopped 02/23/15 1549)  ipratropium-albuterol (DUONEB) 0.5-2.5 (3) MG/3ML nebulizer solution 3 mL (3 mLs Nebulization Given 02/23/15 1503)  predniSONE (DELTASONE) tablet 40 mg (40 mg Oral Given 02/23/15 1451)    Discharge Medication List as of 02/23/2015  3:38 PM    START taking these medications   Details  cefpodoxime (VANTIN) 100 MG tablet Take 1 tablet (100 mg total) by mouth 2 (two) times daily., Starting 02/23/2015, Until Discontinued, Print    predniSONE (DELTASONE) 20 MG tablet 2 tabs po daily x 4 days, Print        The patient appears reasonably screen and/or stabilized for discharge and I doubt any other medical condition or other [facility] requiring further screening, evaluation, or treatment in the ED at this time prior to discharge.      Melene Plan, DO 02/24/15 [phone]

## 2015-02-23 NOTE — ED Notes (Signed)
Pt began experiencing irregular heart beat this morning. Pt has dialysis Tues, Thurs, Sat. Pt was at dialysis today, finished dialysis, and was sent here. Pt c/o shortness of breath. Pt denies any pain.

## 2015-02-23 NOTE — Discharge Instructions (Signed)
Urinary Tract Infection °A urinary tract infection (UTI) can occur any place along the urinary tract. The tract includes the kidneys, ureters, bladder, and urethra. A type of germ called bacteria often causes a UTI. UTIs are often helped with antibiotic medicine.  °HOME CARE  °· If given, take antibiotics as told by your doctor. Finish them even if you start to feel better. °· Drink enough fluids to keep your pee (urine) clear or pale yellow. °· Avoid tea, drinks with caffeine, and bubbly (carbonated) drinks. °· Pee often. Avoid holding your pee in for a long time. °· Pee before and after having sex (intercourse). °· Wipe from front to back after you poop (bowel movement) if you are a woman. Use each tissue only once. °GET HELP RIGHT AWAY IF:  °· You have back pain. °· You have lower belly (abdominal) pain. °· You have chills. °· You feel sick to your stomach (nauseous). °· You throw up (vomit). °· Your burning or discomfort with peeing does not go away. °· You have a fever. °· Your symptoms are not better in 3 days. °MAKE SURE YOU:  °· Understand these instructions. °· Will watch your condition. °· Will get help right away if you are not doing well or get worse. °  °This information is not intended to replace advice given to you by your health care provider. Make sure you discuss any questions you have with your health care provider. °  °Document Released: 09/06/2007 Document Revised: 04/10/2014 Document Reviewed: 10/19/2011 °Elsevier Interactive Patient Education ©2016 [employer] ° °

## 2015-03-03 ENCOUNTER — Ambulatory Visit (HOSPITAL_COMMUNITY)
Admission: RE | Admit: 2015-03-03 | Discharge: 2015-03-03 | Disposition: A | Discharge status: Home / Self Care | Payer: Medicare Other | Source: Ambulatory Visit | Attending: Physical Medicine & Rehabilitation | Admitting: Physical Medicine & Rehabilitation

## 2015-03-03 DIAGNOSIS — Q211 Atrial septal defect: Secondary | ICD-10-CM | POA: Insufficient documentation

## 2015-03-03 DIAGNOSIS — I471 Supraventricular tachycardia: Secondary | ICD-10-CM | POA: Insufficient documentation

## 2015-03-03 DIAGNOSIS — R1310 Dysphagia, unspecified: Secondary | ICD-10-CM

## 2015-03-03 DIAGNOSIS — F329 Major depressive disorder, single episode, unspecified: Secondary | ICD-10-CM | POA: Insufficient documentation

## 2015-03-03 DIAGNOSIS — R1313 Dysphagia, pharyngeal phase: Secondary | ICD-10-CM | POA: Insufficient documentation

## 2015-03-03 DIAGNOSIS — N189 Chronic kidney disease, unspecified: Secondary | ICD-10-CM | POA: Diagnosis not present

## 2015-03-03 DIAGNOSIS — M1990 Unspecified osteoarthritis, unspecified site: Secondary | ICD-10-CM | POA: Insufficient documentation

## 2015-03-03 DIAGNOSIS — J449 Chronic obstructive pulmonary disease, unspecified: Secondary | ICD-10-CM | POA: Diagnosis not present

## 2015-03-03 DIAGNOSIS — I129 Hypertensive chronic kidney disease with stage 1 through stage 4 chronic kidney disease, or unspecified chronic kidney disease: Secondary | ICD-10-CM | POA: Insufficient documentation

## 2015-03-03 DIAGNOSIS — Z8673 Personal history of transient ischemic attack (TIA), and cerebral infarction without residual deficits: Secondary | ICD-10-CM | POA: Insufficient documentation

## 2015-03-03 DIAGNOSIS — J45909 Unspecified asthma, uncomplicated: Secondary | ICD-10-CM | POA: Insufficient documentation

## 2015-03-03 DIAGNOSIS — M109 Gout, unspecified: Secondary | ICD-10-CM | POA: Insufficient documentation

## 2015-03-11 ENCOUNTER — Ambulatory Visit: Payer: Medicare Other | Admitting: Cardiology

## 2015-03-12 DIAGNOSIS — I69391 Dysphagia following cerebral infarction: Secondary | ICD-10-CM | POA: Diagnosis not present

## 2015-03-12 DIAGNOSIS — R1310 Dysphagia, unspecified: Secondary | ICD-10-CM

## 2015-03-12 DIAGNOSIS — I69351 Hemiplegia and hemiparesis following cerebral infarction affecting right dominant side: Secondary | ICD-10-CM

## 2015-03-12 DIAGNOSIS — I69320 Aphasia following cerebral infarction: Secondary | ICD-10-CM

## 2015-03-24 ENCOUNTER — Telehealth: Payer: Self-pay

## 2015-03-24 NOTE — Telephone Encounter (Signed)
Jennelle HumanAllison Mallory-speech therapist with [facility]-called for verbal orders to continue speech therapy 1x per week for 4 more weeks. Left message to approve verbal orders.

## 2015-04-27 ENCOUNTER — Encounter: Payer: Self-pay | Admitting: Internal Medicine

## 2015-04-27 ENCOUNTER — Ambulatory Visit (INDEPENDENT_AMBULATORY_CARE_PROVIDER_SITE_OTHER): Payer: Medicare Other | Admitting: Internal Medicine

## 2015-04-27 VITALS — BP 142/90 | HR 113 | Temp 98.1°F | Resp 12 | Ht 67.0 in | Wt 172.0 lb

## 2015-04-27 DIAGNOSIS — I480 Paroxysmal atrial fibrillation: Secondary | ICD-10-CM

## 2015-04-27 DIAGNOSIS — I150 Renovascular hypertension: Secondary | ICD-10-CM | POA: Diagnosis not present

## 2015-04-27 DIAGNOSIS — A0472 Enterocolitis due to Clostridium difficile, not specified as recurrent: Secondary | ICD-10-CM

## 2015-04-27 DIAGNOSIS — E46 Unspecified protein-calorie malnutrition: Secondary | ICD-10-CM

## 2015-04-27 DIAGNOSIS — I69320 Aphasia following cerebral infarction: Secondary | ICD-10-CM

## 2015-04-27 DIAGNOSIS — N186 End stage renal disease: Secondary | ICD-10-CM | POA: Diagnosis not present

## 2015-04-27 DIAGNOSIS — F17200 Nicotine dependence, unspecified, uncomplicated: Secondary | ICD-10-CM

## 2015-04-27 DIAGNOSIS — Z931 Gastrostomy status: Secondary | ICD-10-CM

## 2015-04-27 DIAGNOSIS — Z992 Dependence on renal dialysis: Principal | ICD-10-CM

## 2015-04-27 DIAGNOSIS — K251 Acute gastric ulcer with perforation: Secondary | ICD-10-CM

## 2015-04-27 DIAGNOSIS — IMO0002 Reserved for concepts with insufficient information to code with codable children: Secondary | ICD-10-CM

## 2015-04-27 DIAGNOSIS — A047 Enterocolitis due to Clostridium difficile: Secondary | ICD-10-CM

## 2015-04-27 NOTE — Patient Instructions (Signed)
We have sent in the referral to [facility]. Please call them and have them send a surgery clearance form to our office so that we can fill it out.   We are not changing your medicines right now.   Come back in 3 months for a follow up. If you have new problems or questions before then please call our office.

## 2015-04-27 NOTE — Progress Notes (Signed)
Pre visit review using our clinic review tool, if applicable. No additional management support is needed unless otherwise documented below in the visit note. 

## 2015-04-29 NOTE — Progress Notes (Signed)
   Subjective:    [name], female    DOB: [birth year], 60 y.o.   MRN: [id number]  HPI The patient is a new 60 YO female. She has complicated PMH so please see A/P for details. Her main concern today is getting cleared to have her PEG tube removed. She had it placed in her most recent hospitalization (started back in September with severe stroke and extended rehab stay, initially was not able to speak or swallow and PEG placed for nutrition). Now her swallowing has been cleared and she is able to eat all consistencies of foods. She still uses walker to ambulate but is independent. She denies falls at home and is not getting PT anymore and does not feel that she needs. Non-smoker now. Is on HD three times per week. She is having pain around the PEG tube. She thinks that the cause of the stroke was A fib. She is only on aspirin. Please see A/P for discussion. Her daughter is with her today and provides most of the history as her speech is still very limited. Her comprehension is fairly good though.   PMH, [facility], social history reviewed and updated.   Review of Systems  Constitutional: Negative for fever, activity change, appetite change and fatigue.  HENT: Negative.   Eyes: Negative.   Respiratory: Negative for cough, chest tightness, shortness of breath and wheezing.   Cardiovascular: Negative for chest pain, palpitations and leg swelling.  Gastrointestinal: Positive for abdominal pain. Negative for nausea, diarrhea, constipation, blood in stool and abdominal distention.  Musculoskeletal: Positive for arthralgias and gait problem. Negative for myalgias and back pain.  Skin: Negative.   Neurological: Positive for weakness. Negative for dizziness, syncope, light-headedness and headaches.  Psychiatric/Behavioral: Positive for dysphoric mood. Negative for confusion and decreased concentration.      Objective:   Physical Exam  Constitutional: She appears well-developed and  well-nourished. No distress.  HENT:  Head: Normocephalic and atraumatic.  Eyes: EOM are normal.  Neck: Normal range of motion. No JVD present.  Cardiovascular: Normal rate.   Irreg irreg   Pulmonary/Chest: Effort normal and breath sounds normal. No respiratory distress. She has no wheezes. She has no rales.  Abdominal: Soft. Bowel sounds are normal. She exhibits no distension. There is tenderness. There is no rebound.  Some soreness around the PEG site, clean and non-infected appearing.   Neurological: She is alert. Coordination abnormal.  Speech is very limited, can follow 2 step commands  Skin: Skin is warm and dry.  Psychiatric: She has a normal mood and affect.   Filed Vitals:   04/27/15 1330  BP: 142/90  Pulse: 113  Temp: 98.1 F (36.7 C)  TempSrc: Oral  Resp: 12  Height:  (1.702 m)  Weight: 172 lb (78.019 kg)  SpO2: 90%      Assessment & Plan:

## 2015-04-30 ENCOUNTER — Encounter: Payer: Self-pay | Admitting: Internal Medicine

## 2015-04-30 ENCOUNTER — Telehealth: Payer: Self-pay | Admitting: Internal Medicine

## 2015-04-30 NOTE — Assessment & Plan Note (Signed)
She is able to communicate with her daughter although her words are limited. Her comprehension is good and she is able to answer questions and complete one step tasks.

## 2015-04-30 NOTE — Assessment & Plan Note (Signed)
Will need to review hospital records to see what the discussion was regarding full anticoagulation. She would be a candidate but also notice GI bleed in April of this year which may be why she is not on any therapy. She is not on beta blocker for rate control. On full dose aspirin.

## 2015-04-30 NOTE — Assessment & Plan Note (Signed)
Will need to review records to see when this was and if this was the reason she is not on full anticoagulation.

## 2015-04-30 NOTE — Assessment & Plan Note (Signed)
She is able to swallow all foods now which is helping for her to build back her reserves.

## 2015-04-30 NOTE — Assessment & Plan Note (Signed)
She does want to get the PEG tube out but my main concern just meeting her is recurrent stroke. Would feel more comfortable if they could do full anticoagulation for at least a week around the date of the surgery given her 2 recent severe strokes.

## 2015-04-30 NOTE — Assessment & Plan Note (Signed)
Not smoking now and reminded her to stay quit.

## 2015-04-30 NOTE — Telephone Encounter (Signed)
Left voice message to call back to schedule 30 minute OV per Dr. Okey Dupre to completed [facility] form for new oxygen concentrator.

## 2015-04-30 NOTE — Assessment & Plan Note (Signed)
BP mildly elevated today on nothing. Unclear why she is not on anything and wonder if she had hypotension. Review records and adjust as needed. With her 2 strokes in the past we would want good control.

## 2015-04-30 NOTE — Assessment & Plan Note (Signed)
Does dialysis three times a week. Has been on dialysis about a year or more (they were not sure) getting records from her nephrologist.

## 2015-04-30 NOTE — Assessment & Plan Note (Signed)
Not currently having symptoms and would say resolved but the need for only judicious antibiotics is strong to avoid recurrence.

## 2015-05-04 ENCOUNTER — Other Ambulatory Visit (HOSPITAL_COMMUNITY): Payer: Self-pay | Admitting: General Surgery

## 2015-05-04 ENCOUNTER — Other Ambulatory Visit: Payer: Self-pay | Admitting: Internal Medicine

## 2015-05-04 DIAGNOSIS — R633 Feeding difficulties, unspecified: Secondary | ICD-10-CM

## 2015-05-05 ENCOUNTER — Ambulatory Visit (HOSPITAL_COMMUNITY)
Admission: RE | Admit: 2015-05-05 | Discharge: 2015-05-05 | Disposition: A | Discharge status: Home / Self Care | Payer: Medicare Other | Source: Ambulatory Visit | Attending: General Surgery | Admitting: General Surgery

## 2015-05-05 DIAGNOSIS — R633 Feeding difficulties, unspecified: Secondary | ICD-10-CM

## 2015-05-05 DIAGNOSIS — Z434 Encounter for attention to other artificial openings of digestive tract: Secondary | ICD-10-CM | POA: Insufficient documentation

## 2015-05-05 MED ORDER — LIDOCAINE VISCOUS 2 % MT SOLN
OROMUCOSAL | Status: AC
  Filled 2015-05-05: qty 15

## 2015-05-05 NOTE — Procedures (Signed)
Successful GJ tube removal No comp Stable Full report in PACS

## 2015-05-06 ENCOUNTER — Telehealth: Payer: Self-pay | Admitting: *Deleted

## 2015-05-06 NOTE — Telephone Encounter (Signed)
Patient is scheduled for 2/21

## 2015-05-06 NOTE — Telephone Encounter (Signed)
Noted. Form is in Coriana Angello's "work to be completed" folder on her desk.

## 2015-05-06 NOTE — Telephone Encounter (Signed)
Received call mom is requesting refill on her alprazolam..../lmb

## 2015-05-07 MED ORDER — ALPRAZOLAM 0.5 MG PO TABS: ORAL_TABLET | ORAL | Status: DC

## 2015-05-07 NOTE — Telephone Encounter (Signed)
Notified pt daughter rx has been fax to [facility]...Raechel Chute

## 2015-05-07 NOTE — Telephone Encounter (Signed)
Pls advise on msg below.../lmb 

## 2015-05-07 NOTE — Telephone Encounter (Signed)
Printed and signed.  

## 2015-05-23 ENCOUNTER — Emergency Department (HOSPITAL_COMMUNITY)
Admission: EM | Admit: 2015-05-23 | Discharge: 2015-05-23 | Disposition: A | Discharge status: Home / Self Care | Payer: Medicare Other | Attending: Physician Assistant | Admitting: Physician Assistant

## 2015-05-23 ENCOUNTER — Emergency Department (HOSPITAL_COMMUNITY): Payer: Medicare Other

## 2015-05-23 ENCOUNTER — Encounter (HOSPITAL_COMMUNITY): Payer: Self-pay | Admitting: *Deleted

## 2015-05-23 DIAGNOSIS — Z87891 Personal history of nicotine dependence: Secondary | ICD-10-CM | POA: Insufficient documentation

## 2015-05-23 DIAGNOSIS — Z8669 Personal history of other diseases of the nervous system and sense organs: Secondary | ICD-10-CM | POA: Diagnosis not present

## 2015-05-23 DIAGNOSIS — J441 Chronic obstructive pulmonary disease with (acute) exacerbation: Secondary | ICD-10-CM | POA: Diagnosis not present

## 2015-05-23 DIAGNOSIS — R000 Tachycardia, unspecified: Secondary | ICD-10-CM | POA: Insufficient documentation

## 2015-05-23 DIAGNOSIS — M1990 Unspecified osteoarthritis, unspecified site: Secondary | ICD-10-CM | POA: Diagnosis not present

## 2015-05-23 DIAGNOSIS — R062 Wheezing: Secondary | ICD-10-CM

## 2015-05-23 DIAGNOSIS — J069 Acute upper respiratory infection, unspecified: Secondary | ICD-10-CM | POA: Diagnosis not present

## 2015-05-23 DIAGNOSIS — F329 Major depressive disorder, single episode, unspecified: Secondary | ICD-10-CM | POA: Insufficient documentation

## 2015-05-23 DIAGNOSIS — N189 Chronic kidney disease, unspecified: Secondary | ICD-10-CM | POA: Insufficient documentation

## 2015-05-23 DIAGNOSIS — Z7982 Long term (current) use of aspirin: Secondary | ICD-10-CM | POA: Insufficient documentation

## 2015-05-23 DIAGNOSIS — Z79899 Other long term (current) drug therapy: Secondary | ICD-10-CM | POA: Insufficient documentation

## 2015-05-23 DIAGNOSIS — I129 Hypertensive chronic kidney disease with stage 1 through stage 4 chronic kidney disease, or unspecified chronic kidney disease: Secondary | ICD-10-CM | POA: Insufficient documentation

## 2015-05-23 DIAGNOSIS — Z7951 Long term (current) use of inhaled steroids: Secondary | ICD-10-CM | POA: Insufficient documentation

## 2015-05-23 DIAGNOSIS — Z8673 Personal history of transient ischemic attack (TIA), and cerebral infarction without residual deficits: Secondary | ICD-10-CM | POA: Diagnosis not present

## 2015-05-23 DIAGNOSIS — R05 Cough: Secondary | ICD-10-CM | POA: Diagnosis present

## 2015-05-23 LAB — BASIC METABOLIC PANEL
ANION GAP: 15 (ref 5–15)
BUN: 29 mg/dL — ABNORMAL HIGH (ref 6–20)
CHLORIDE: 92 mmol/L — AB (ref 101–111)
CO2: 26 mmol/L (ref 22–32)
CREATININE: 6.21 mg/dL — AB (ref 0.44–1.00)
Calcium: 9.1 mg/dL (ref 8.9–10.3)
GFR calc non Af Amer: 7 mL/min — ABNORMAL LOW (ref >60)
GFR, EST AFRICAN AMERICAN: 8 mL/min — AB (ref >60)
Glucose, Bld: 83 mg/dL (ref 65–99)
Potassium: 4.3 mmol/L (ref 3.5–5.1)
SODIUM: 133 mmol/L — AB (ref 135–145)

## 2015-05-23 LAB — CBC
HCT: 38.3 % (ref 36.0–46.0)
HEMOGLOBIN: 11.6 g/dL — AB (ref 12.0–15.0)
MCH: 25.3 pg — ABNORMAL LOW (ref 26.0–34.0)
MCHC: 30.3 g/dL (ref 30.0–36.0)
MCV: 83.6 fL (ref 78.0–100.0)
Platelets: 190 10*3/uL (ref 150–400)
RBC: 4.58 MIL/uL (ref 3.87–5.11)
RDW: 19.1 % — ABNORMAL HIGH (ref 11.5–15.5)
WBC: 6.4 10*3/uL (ref 4.0–10.5)

## 2015-05-23 MED ORDER — PREDNISONE 20 MG PO TABS
60.0000 mg | ORAL_TABLET | Freq: Once | ORAL | Status: AC
  Administered 2015-05-23: 60 mg via ORAL
  Filled 2015-05-23: qty 3

## 2015-05-23 MED ORDER — IPRATROPIUM-ALBUTEROL 0.5-2.5 (3) MG/3ML IN SOLN
3.0000 mL | RESPIRATORY_TRACT | Status: DC | PRN
  Administered 2015-05-23: 3 mL via RESPIRATORY_TRACT
  Filled 2015-05-23: qty 3

## 2015-05-23 MED ORDER — PREDNISONE 50 MG PO TABS: ORAL_TABLET | ORAL | Status: DC

## 2015-05-23 MED ORDER — ALBUTEROL SULFATE HFA 108 (90 BASE) MCG/ACT IN AERS
2.0000 | INHALATION_SPRAY | Freq: Once | RESPIRATORY_TRACT | Status: DC
  Filled 2015-05-23: qty 6.7

## 2015-05-23 MED ORDER — AZITHROMYCIN 250 MG PO TABS: ORAL_TABLET | ORAL | Status: DC

## 2015-05-23 NOTE — ED Notes (Signed)
Pts family reports productive cough, congestion for several days. Denies fevers.

## 2015-05-23 NOTE — ED Notes (Signed)
Patient left at this time with all belongings. 

## 2015-05-23 NOTE — ED Notes (Signed)
Pt states improved sob after breathing tx. 

## 2015-05-23 NOTE — ED Provider Notes (Signed)
CSN: [id number]     Arrival date & time 05/23/15  1326 History   First MD Initiated Contact with Patient 05/23/15 1844     Chief Complaint  Patient presents with  . Abdominal Pain  . Cough   Patient is a 60 y.o. female presenting with cough. The history is provided by the patient and a relative. No language interpreter was used.  Cough Cough characteristics:  Productive Sputum characteristics:  Nondescript Severity:  Mild Onset quality:  Gradual Duration:  1 day Timing:  Intermittent Progression:  Worsening Chronicity:  New Smoker: no   Context: sick contacts   Relieved by:  Nothing Worsened by:  Nothing tried Ineffective treatments:  None tried Associated symptoms: wheezing   Associated symptoms: no chest pain, no chills, no ear pain, no fever, no headaches, no rash, no rhinorrhea, no shortness of breath and no sore throat     Past Medical History  Diagnosis Date  . Asthma   . Hypertension   . Gout   . Arthritis   . Insomnia   . Chronic kidney disease   . Depression   . Renal insufficiency   . Aortic aneurysm without rupture (HCC)     3cm by CT 08/03/14  . Paroxysmal SVT (supraventricular tachycardia) (HCC)   . Cor pulmonale (HCC)   . Stroke (HCC) 06/2014  . PFO (patent foramen ovale)   . COPD (chronic obstructive pulmonary disease) [facility])    Past Surgical History  Procedure Laterality Date  . Multiple tooth extractions    . Av fistula placement Left 01/28/2014    Procedure: ARTERIOVENOUS (AV) FISTULA CREATION;  Surgeon: Sherren Kerns, MD;  Location: [facility] OR;  Service: Vascular;  Laterality: Left;  . Tee without cardioversion N/A 06/23/2014    Procedure: TRANSESOPHAGEAL ECHOCARDIOGRAM (TEE);  Surgeon: Lewayne Bunting, MD;  Location: [facility] ENDOSCOPY;  Service: Cardiovascular;  Laterality: N/A;  . Insertion of dialysis catheter Right 06/27/2014    Procedure: INSERTION OF Right Internal Jugular DIATEK CATHETER;  Surgeon: Pryor Ochoa, MD;  Location: [facility] OR;  Service:  Vascular;  Laterality: Right;  . Laparotomy N/A 07/01/2014    Procedure: EXPLORATORY LAPAROTOMY WITH CLOSURE OF PERFORATED PYLORIC ULCER;  Surgeon: Claud Kelp, MD;  Location: [facility] OR;  Service: General;  Laterality: N/A;  . Bowel resection N/A 07/01/2014    Procedure: SMALL BOWEL RESECTION;  Surgeon: Claud Kelp, MD;  Location: [facility] OR;  Service: General;  Laterality: N/A;  . Revison of arteriovenous fistula Left 09/09/2014    Procedure: LIGATION OF COMPETING BRANCH, & RESECTION OF VENOUS ANEURYSM OF LEFT UPPER ARM ARTERIOVENOUS FISTULA;  Surgeon: Larina Earthly, MD;  Location: [facility] OR;  Service: Vascular;  Laterality: Left;   Family History  Problem Relation Age of Onset  . Diabetes Mother   . Hypertension Mother   . Heart disease Mother   . Heart attack Mother   . Peripheral vascular disease Mother   . Diabetes Father   . Heart disease Father   . Hypertension Father   . Diabetes Sister   . Hypertension Sister   . Heart disease Sister     before age 7  . Heart attack Sister   . Peripheral vascular disease Sister   . Heart disease Brother   . Hyperlipidemia Daughter    Social History  Substance Use Topics  . Smoking status: Former Smoker -- 0.50 packs/day for 20 years    Types: Cigarettes  . Smokeless tobacco: Never Used  . Alcohol Use: No  Comment: occ   OB History    No data available     Review of Systems  Constitutional: Negative for fever, chills, activity change and appetite change.  HENT: Negative for congestion, dental problem, ear pain, facial swelling, hearing loss, rhinorrhea, sneezing, sore throat, trouble swallowing and voice change.   Eyes: Negative for photophobia, pain, redness and visual disturbance.  Respiratory: Positive for cough and wheezing. Negative for apnea, chest tightness, shortness of breath and stridor.   Cardiovascular: Negative for chest pain, palpitations and leg swelling.  Gastrointestinal: Negative for nausea, vomiting, abdominal pain,  diarrhea, constipation, blood in stool and abdominal distention.  Endocrine: Negative for polydipsia and polyuria.  Genitourinary: Negative for frequency, hematuria, flank pain, decreased urine volume and difficulty urinating.  Musculoskeletal: Negative for back pain, joint swelling, gait problem, neck pain and neck stiffness.  Skin: Negative for rash and wound.  Allergic/Immunologic: Negative for immunocompromised state.  Neurological: Negative for dizziness, syncope, facial asymmetry, speech difficulty, weakness, light-headedness, numbness and headaches.  Hematological: Negative for adenopathy.  Psychiatric/Behavioral: Negative for suicidal ideas, behavioral problems, confusion, sleep disturbance and agitation. The patient is not nervous/anxious.   All other systems reviewed and are negative.     Allergies  Heparin  Home Medications   Prior to Admission medications   Medication Sig Start Date End Date Taking? Authorizing Provider  albuterol (PROVENTIL) (2.5 MG/3ML) 0.083% nebulizer solution Take 3 mLs (2.5 mg total) by nebulization every 6 (six) hours as needed for wheezing or shortness of breath. 12/26/14   Ranelle Oyster, MD  ALPRAZolam Prudy Feeler) 0.5 MG tablet Xanax 0.25 mg every 8 hours as needed and 0.5 mg every Tuesday Thursday Saturday with dialysis 05/07/15   Myrlene Broker, MD  aspirin 325 MG tablet Take 1 tablet (325 mg total) by mouth daily. 12/24/14   Mcarthur Rossetti Angiulli, PA-C  azithromycin (ZITHROMAX) 250 MG tablet Please take 2 tablets on day 1, 1 tablet for next 4 days. 05/23/15   Dan Humphreys, MD  budesonide (PULMICORT) 0.25 MG/2ML nebulizer solution Take 2 mLs (0.25 mg total) by nebulization 2 (two) times daily. 12/24/14   Mcarthur Rossetti Angiulli, PA-C  ipratropium-albuterol (DUONEB) 0.5-2.5 (3) MG/3ML SOLN Take 3 mLs by nebulization 3 (three) times daily. 12/24/14   Mcarthur Rossetti Angiulli, PA-C  levETIRAcetam (KEPPRA) 100 MG/ML solution Take 5 mLs (500 mg total) by mouth 2 (two)  times daily. 12/24/14   Mcarthur Rossetti Angiulli, PA-C  levothyroxine (SYNTHROID, LEVOTHROID) 25 MCG tablet Take 1 tablet (25 mcg total) by mouth daily before breakfast. 12/24/14   Mcarthur Rossetti Angiulli, PA-C  mirtazapine (REMERON) 15 MG tablet Place 1 tablet (15 mg total) into feeding tube at bedtime. Patient taking differently: Take 15 mg by mouth at bedtime.  12/24/14   Mcarthur Rossetti Angiulli, PA-C  pantoprazole (PROTONIX) 40 MG tablet Take 40 mg by mouth daily.    Historical Provider, MD  predniSONE (DELTASONE) 50 MG tablet Please take one pill per day for 4 days 05/23/15   Dan Humphreys, MD   BP 158/105 mmHg  Pulse 115  Temp(Src) 98.4 F (36.9 C) (Oral)  Resp 23  SpO2 97% Physical Exam  Constitutional: She is oriented to person, place, and time. She appears well-developed and well-nourished. No distress.  HENT:  Head: Normocephalic and atraumatic.  Right Ear: External ear normal.  Left Ear: External ear normal.  Eyes: Pupils are equal, round, and reactive to light. Right eye exhibits no discharge. Left eye exhibits no discharge.  Neck: Normal range of  motion. No JVD present. No tracheal deviation present.  Cardiovascular: Regular rhythm and normal heart sounds.  Tachycardia present.  Exam reveals no friction rub.   No murmur heard. Pulmonary/Chest: Effort normal. No stridor. No respiratory distress. She has wheezes.  Abdominal: Soft. Bowel sounds are normal. She exhibits no distension. There is no rebound and no guarding.  Musculoskeletal: Normal range of motion. She exhibits no edema or tenderness.  Lymphadenopathy:    She has no cervical adenopathy.  Neurological: She is alert and oriented to person, place, and time. No cranial nerve deficit. Coordination normal.  Skin: Skin is warm and dry. No rash noted. No pallor.  Psychiatric: She has a normal mood and affect. Her behavior is normal. Judgment and thought content normal.  Nursing note and vitals reviewed.   ED Course  Procedures (including  critical care time) Labs Review Labs Reviewed  CBC - Abnormal; Notable for the following:    Hemoglobin 11.6 (*)    MCH 25.3 (*)    RDW 19.1 (*)    All other components within normal limits  BASIC METABOLIC PANEL - Abnormal; Notable for the following:    Sodium 133 (*)    Chloride 92 (*)    BUN 29 (*)    Creatinine, Ser 6.21 (*)    GFR calc non Af Amer 7 (*)    GFR calc Af Amer 8 (*)    All other components within normal limits    Imaging Review Dg Chest 2 View  05/23/2015  CLINICAL DATA:  Productive cough for several days EXAM: CHEST  2 VIEW COMPARISON:  02/23/2015 FINDINGS: Severe cardiomegaly. Normal vascularity. No pulmonary edema. No consolidation. No pleural effusion or pneumothorax. IMPRESSION: Cardiomegaly without decompensation. Electronically Signed   By: Jolaine Click M.D.   On: 05/23/2015 14:41   I have personally reviewed and evaluated these images and lab results as part of my medical decision-making.   EKG Interpretation None      MDM   Final diagnoses:  URI (upper respiratory infection)  Wheezing    Patient presents with her daughter for evaluation of productive cough and congestion for several days. Denies fevers at home. History of prior CVA with residual deficits but no history of COPD.  Patient afebrile upon arrival. Initial heart rate 115. She has been using her albuterol puffer at home day without improvement in her symptoms. Blood pressure stable upon arrival. She is on her home oxygen requirement with stable oxygen saturations in the mid to upper 90s.  Chest x-ray with no acute findings. BMP stable at baseline. CBC with white blood cell count of 6.4.  Patient given DuoNeb treatment with marked improvement in her symptoms. She was given prednisone in the emergency department  Patient was given albuterol puffer to continue use at home. Was discharged with 4 days of prednisone, azithromycin.  Patient feels much better following the above treatments  and was deemed stable for discharge home and primary care follow-up. Diagnosis URI with wheezing. Do not suspect pneumonia, COPD.    Discussed with Dr. Corlis Leak.      Dan Humphreys, MD 05/24/15 1610  Mitzi Hansen Randall An, MD 05/24/15 9604

## 2015-05-23 NOTE — Discharge Instructions (Signed)

## 2015-05-25 ENCOUNTER — Encounter: Payer: Self-pay | Admitting: Internal Medicine

## 2015-05-25 ENCOUNTER — Ambulatory Visit (INDEPENDENT_AMBULATORY_CARE_PROVIDER_SITE_OTHER): Payer: Medicare Other | Admitting: Internal Medicine

## 2015-05-25 VITALS — BP 142/100 | HR 135 | Temp 98.6°F | Resp 16 | Ht 67.0 in | Wt 166.8 lb

## 2015-05-25 DIAGNOSIS — I5032 Chronic diastolic (congestive) heart failure: Secondary | ICD-10-CM

## 2015-05-25 DIAGNOSIS — I480 Paroxysmal atrial fibrillation: Secondary | ICD-10-CM

## 2015-05-25 DIAGNOSIS — E46 Unspecified protein-calorie malnutrition: Secondary | ICD-10-CM | POA: Diagnosis not present

## 2015-05-25 MED ORDER — METOPROLOL SUCCINATE ER 25 MG PO TB24
25.0000 mg | ORAL_TABLET | Freq: Every day | ORAL | Status: DC
Start: 2015-05-25 — End: 2015-09-26

## 2015-05-25 MED ORDER — ESZOPICLONE 1 MG PO TABS: 1.0000 mg | ORAL_TABLET | Freq: Every evening | ORAL | Status: DC | PRN

## 2015-05-25 NOTE — Progress Notes (Signed)
   Subjective:    [name], female    DOB: [birth year], 60 y.o.   MRN: [id number]  HPI The patient is a 60 YO female coming in for home oxygen re-certification. She uses it for walking around at home. She gets SOB if she does not use it. Also she has noticed that her HR was high at dialysis the other day. She has not seen cardiology as her daughter was not able to take her to scheduled visit and they forgot about it. Not on any medicines for her heart right now including blood thinners.   Review of Systems  Constitutional: Negative for fever, activity change, appetite change and fatigue.  HENT: Negative.   Eyes: Negative.   Respiratory: Negative for cough, chest tightness, shortness of breath and wheezing.   Cardiovascular: Negative for chest pain, palpitations and leg swelling.  Gastrointestinal: Negative for nausea, abdominal pain, diarrhea, constipation, blood in stool and abdominal distention.  Musculoskeletal: Positive for arthralgias and gait problem. Negative for myalgias and back pain.  Skin: Negative.   Neurological: Positive for weakness. Negative for dizziness, syncope, light-headedness and headaches.  Psychiatric/Behavioral: Positive for dysphoric mood. Negative for confusion and decreased concentration.      Objective:   Physical Exam  Constitutional: She appears well-developed and well-nourished. No distress.  HENT:  Head: Normocephalic and atraumatic.  Eyes: EOM are normal.  Neck: Normal range of motion. No JVD present.  Cardiovascular: Normal rate.   Irreg irreg   Pulmonary/Chest: Effort normal and breath sounds normal. No respiratory distress. She has no wheezes. She has no rales.  Abdominal: Soft. Bowel sounds are normal. She exhibits no distension. There is no tenderness. There is no rebound.  Neurological: She is alert. Coordination abnormal.  Speech is very limited, can follow 2 step commands  Skin: Skin is warm and dry.  Psychiatric: She has a  normal mood and affect.   Filed Vitals:   05/25/15 1304 05/25/15 1322 05/25/15 1323  BP: 142/100    Pulse: 135    Temp: 98.6 F (37 C)    TempSrc: Oral    Resp: 16    Height:  (1.702 m)    Weight: 166 lb 12.8 oz (75.66 kg)    SpO2: 95% 87% 93%                           At rest, no O2                        After 1 lap around office no O    After 2LO2     Assessment & Plan:

## 2015-05-25 NOTE — Assessment & Plan Note (Signed)
Causing hypoxia with walking, O2 papers filled out. O2 to 87% with walking and back to normal with 2 L O2.

## 2015-05-25 NOTE — Progress Notes (Signed)
Pre visit review using our clinic review tool, if applicable. No additional management support is needed unless otherwise documented below in the visit note. 

## 2015-05-25 NOTE — Patient Instructions (Addendum)
We will get you in with the heart doctor. We have sent in a medicine for the heart to keep it from beat too fast called metoprolol. Take 1 pill daily to help the heart.   We have sent in lunesta for sleep which will help. It is something you can take when you need it but you should not take every night.

## 2015-05-25 NOTE — Assessment & Plan Note (Signed)
HR previously at goal, now BP higher and HR is above goal. Start metoprolol-xl 25 mg daily to help with rate control. Referral to cardiology placed to discuss stronger anticoagulation with them. She is currently on ASA 325 mg daily.

## 2015-05-25 NOTE — Assessment & Plan Note (Signed)
Weight is stable and still working on recovery.

## 2015-06-02 ENCOUNTER — Telehealth: Payer: Self-pay | Admitting: Internal Medicine

## 2015-06-02 NOTE — Telephone Encounter (Signed)
I have the form. Dr. Okey Dupre has to make a couple changes and it will be faxed back. Patient is aware.

## 2015-06-02 NOTE — Telephone Encounter (Signed)
Wanted to know if oxygen Equipment Form was received.  Faxed back yesterday.  Patient has $700.00 holding on their account.  Can not get processed until form is returned.  Will fax another copy to [phone] (A side).

## 2015-06-21 ENCOUNTER — Encounter: Payer: Self-pay | Admitting: Surgery

## 2015-06-24 ENCOUNTER — Telehealth: Payer: Self-pay | Admitting: *Deleted

## 2015-06-24 MED ORDER — ALPRAZOLAM 0.5 MG PO TABS: ORAL_TABLET | ORAL | Status: DC

## 2015-06-24 NOTE — Telephone Encounter (Signed)
Received call daughter states pharmacy has sent request to refill mom alprazolam, but haven't received call back. Pls advise...Raechel Chute/lmb

## 2015-06-24 NOTE — Addendum Note (Signed)
Addended by: Deatra JamesBRAND, Venecia Mehl M on: 06/24/2015 01:07 PM   Modules accepted: Orders, Medications

## 2015-06-24 NOTE — Telephone Encounter (Signed)
Printed and signed.  

## 2015-06-24 NOTE — Telephone Encounter (Signed)
Notified daughter rx fax to [facility]...Raechel Chute/lmb

## 2015-06-25 ENCOUNTER — Other Ambulatory Visit: Payer: Self-pay

## 2015-06-25 ENCOUNTER — Ambulatory Visit (INDEPENDENT_AMBULATORY_CARE_PROVIDER_SITE_OTHER): Payer: Medicare Other | Admitting: Surgery

## 2015-06-25 ENCOUNTER — Encounter: Payer: Self-pay | Admitting: Surgery

## 2015-06-25 VITALS — BP 156/115 | HR 70 | Temp 98.5°F | Resp 16 | Ht 67.0 in | Wt 170.0 lb

## 2015-06-25 DIAGNOSIS — N186 End stage renal disease: Secondary | ICD-10-CM

## 2015-06-25 NOTE — Progress Notes (Signed)
Patient name: [name] MRN: [id number] DOB: 03-20-1956 Sex: female     Chief Complaint  Patient presents with  . New Evaluation    painful AVF aneurysm  left upper arm    HISTORY OF PRESENT ILLNESS: This is a 60 year old female with end-stage renal disease.  She had a left brachiocephalic fistula placed by Dr. Arbie Cookey last year which was also revised with branch ligation.  She has developed a painful aneurysm near her arteriovenous anastomosis.  This is getting bigger.  She also has a separate area of infiltration in the mid upper arm which is aneurysmal.  There are no skin changes or bleeding issues.  The patient has a history of stroke which has affected her verbal communication.  She suffers from atrial fibrillation but is not on anticoagulation.  Past Medical History  Diagnosis Date  . Asthma   . Hypertension   . Gout   . Arthritis   . Insomnia   . Chronic kidney disease   . Depression   . Renal insufficiency   . Aortic aneurysm without rupture (HCC)     3cm by CT 08/03/14  . Paroxysmal SVT (supraventricular tachycardia) (HCC)   . Cor pulmonale (HCC)   . Stroke (HCC) 06/2014  . PFO (patent foramen ovale)   . COPD (chronic obstructive pulmonary disease) [facility])     Past Surgical History  Procedure Laterality Date  . Multiple tooth extractions    . Av fistula placement Left 01/28/2014    Procedure: ARTERIOVENOUS (AV) FISTULA CREATION;  Surgeon: Sherren Kerns, MD;  Location: [facility] OR;  Service: Vascular;  Laterality: Left;  . Tee without cardioversion N/A 06/23/2014    Procedure: TRANSESOPHAGEAL ECHOCARDIOGRAM (TEE);  Surgeon: Lewayne Bunting, MD;  Location: [facility] ENDOSCOPY;  Service: Cardiovascular;  Laterality: N/A;  . Insertion of dialysis catheter Right 06/27/2014    Procedure: INSERTION OF Right Internal Jugular DIATEK CATHETER;  Surgeon: Pryor Ochoa, MD;  Location: [facility] OR;  Service: Vascular;  Laterality: Right;  . Laparotomy N/A 07/01/2014    Procedure:  EXPLORATORY LAPAROTOMY WITH CLOSURE OF PERFORATED PYLORIC ULCER;  Surgeon: Claud Kelp, MD;  Location: [facility] OR;  Service: General;  Laterality: N/A;  . Bowel resection N/A 07/01/2014    Procedure: SMALL BOWEL RESECTION;  Surgeon: Claud Kelp, MD;  Location: [facility] OR;  Service: General;  Laterality: N/A;  . Revison of arteriovenous fistula Left 09/09/2014    Procedure: LIGATION OF COMPETING BRANCH, & RESECTION OF VENOUS ANEURYSM OF LEFT UPPER ARM ARTERIOVENOUS FISTULA;  Surgeon: Larina Earthly, MD;  Location: [facility] OR;  Service: Vascular;  Laterality: Left;    Social History   Social History  . Marital Status: Single    Spouse Name: N/A  . Number of Children: N/A  . Years of Education: N/A   Occupational History  . Not on file.   Social History Main Topics  . Smoking status: Former Smoker -- 0.50 packs/day for 20 years    Types: Cigarettes  . Smokeless tobacco: Never Used  . Alcohol Use: No     Comment: occ  . Drug Use: No  . Sexual Activity: Not on file   Other Topics Concern  . Not on file   Social History Narrative    Family History  Problem Relation Age of Onset  . Diabetes Mother   . Hypertension Mother   . Heart disease Mother   . Heart attack Mother   . Peripheral vascular disease Mother   .  Diabetes Father   . Heart disease Father   . Hypertension Father   . Diabetes Sister   . Hypertension Sister   . Heart disease Sister     before age 60  . Heart attack Sister   . Peripheral vascular disease Sister   . Heart disease Brother   . Hyperlipidemia Daughter     Allergies as of 06/25/2015 - Review Complete 06/25/2015  Allergen Reaction Noted  . Heparin Other (See Comments) 08/20/2014    Current Outpatient Prescriptions on File Prior to Visit  Medication Sig Dispense Refill  . albuterol (PROVENTIL) (2.5 MG/3ML) 0.083% nebulizer solution Take 3 mLs (2.5 mg total) by nebulization every 6 (six) hours as needed for wheezing or shortness of breath. 75 mL 12  .  ALPRAZolam (XANAX) 0.5 MG tablet Xanax 0.25 mg every 8 hours as needed and 0.5 mg every Tuesday Thursday Saturday with dialysis 30 tablet 1  . aspirin 325 MG tablet Take 1 tablet (325 mg total) by mouth daily.    . budesonide (PULMICORT) 0.25 MG/2ML nebulizer solution Take 2 mLs (0.25 mg total) by nebulization 2 (two) times daily. 60 mL 12  . eszopiclone (LUNESTA) 1 MG TABS tablet Take 1 tablet (1 mg total) by mouth at bedtime as needed for sleep. Take immediately before bedtime 30 tablet 1  . ipratropium-albuterol (DUONEB) 0.5-2.5 (3) MG/3ML SOLN Take 3 mLs by nebulization 3 (three) times daily. 360 mL 1  . levETIRAcetam (KEPPRA) 100 MG/ML solution Take 5 mLs (500 mg total) by mouth 2 (two) times daily. 473 mL 12  . levothyroxine (SYNTHROID, LEVOTHROID) 25 MCG tablet Take 1 tablet (25 mcg total) by mouth daily before breakfast. 30 tablet 1  . metoprolol succinate (TOPROL-XL) 25 MG 24 hr tablet Take 1 tablet (25 mg total) by mouth daily. 90 tablet 3  . mirtazapine (REMERON) 15 MG tablet Place 1 tablet (15 mg total) into feeding tube at bedtime. (Patient taking differently: Take 15 mg by mouth at bedtime. ) 30 tablet 1  . pantoprazole (PROTONIX) 40 MG tablet Take 40 mg by mouth daily. Reported on 05/25/2015    . predniSONE (DELTASONE) 50 MG tablet Please take one pill per day for 4 days 4 tablet 0  . azithromycin (ZITHROMAX) 250 MG tablet Please take 2 tablets on day 1, 1 tablet for next 4 days. (Patient not taking: Reported on 06/25/2015) 6 each 0   No current facility-administered medications on file prior to visit.     REVIEW OF SYSTEMS: Unable to obtain from patient as she is noncommunicative given her stroke  PHYSICAL EXAMINATION:   Vital signs are  Filed Vitals:   06/25/15 0953 06/25/15 0957  BP: 152/116 156/115  Pulse: 70 70  Temp: 98.5 F (36.9 C)   Resp: 16   Height: 5\' 7"  (1.702 m)   Weight: 170 lb (77.111 kg)   SpO2: 100%    Body mass index is 26.62 kg/(m^2). General: The  patient appears their stated age. HEENT:  No gross abnormalities Pulmonary:  Non labored breathing Musculoskeletal: There are no major deformities. Neurologic: No focal weakness or paresthesias are detected, Skin: There are no ulcer or rashes noted. Psychiatric: The patient has normal affect. Cardiovascular: Excellent thrill within her left brachiocephalic fistula.  Just past her arteriovenous anastomosis there is a lobulated large aneurysm which is tender to the touch.  There are no skin changes.  There is also a second aneurysm in the mid fistula which is consistent with the patient describes an  infiltration.   Diagnostic Studies None  Assessment: Left brachiocephalic fistula aneurysm Plan: I discussed with the patient that I would recommend treating the aneurysm near the arch of venous anastomosis initially.  This is the one that is painful and lobulated.  I think this can be done with plication.  I told her I would not address the aneurysm in the mid fistula as I think if I do, she would require a catheter for one month while the incisions heal.  In addition I think this area may go on to decrease in size given that it was secondary to an infiltration.  She is in agreement with this.  I have scheduled her operation for Tuesday, April 4  V. Charlena Cross, M.D. [facility] Office: [phone] Pager:  [phone]

## 2015-06-25 NOTE — Progress Notes (Signed)
Filed Vitals:   06/25/15 0953 06/25/15 0957  BP: 152/116 156/115  Pulse: 70 70  Temp: 98.5 F (36.9 C)   Resp: 16   Height: 5\' 7"  (1.702 m)   Weight: 170 lb (77.111 kg)   SpO2: 100%

## 2015-06-30 ENCOUNTER — Telehealth: Payer: Self-pay | Admitting: Internal Medicine

## 2015-06-30 NOTE — Telephone Encounter (Signed)
Pt's daughter called stating pt was summoned for jury duty and she is requesting a letter stating pts condition and she is unable to do it.

## 2015-06-30 NOTE — Telephone Encounter (Signed)
Checking on oxygen form that was dropped off in the last few weeks.  Would like to know when they can received back.

## 2015-07-05 NOTE — Telephone Encounter (Signed)
Form has been filled out and faxed weeks ago.

## 2015-07-06 ENCOUNTER — Encounter (HOSPITAL_COMMUNITY): Payer: Self-pay | Admitting: *Deleted

## 2015-07-06 NOTE — Telephone Encounter (Signed)
Per Dr. Okey Duprerawford, patient needs a visit to discuss a letter to dismiss from jury duty.

## 2015-07-06 NOTE — Progress Notes (Signed)
Pt has history of SVT, seeing Dr. Elease HashimotoNahser tomorrow 07/07/15 to get established with a cardiologist. Spoke with pt's daughter, [name] for pre-op call. Pt is at dialysis and she still has difficulty getting her words after her stroke last year. Her daughter denies any recent complaints of chest pain from pt. She wears O2 at 2 L 24/7 for sob due to COPD and Asthma.

## 2015-07-06 NOTE — Telephone Encounter (Signed)
Spoke to patient's daughter and she said she has not heard anything else about the oxygen paperwork, so it must be taken care of.

## 2015-07-06 NOTE — Telephone Encounter (Signed)
Patient's daughter Roxy Horseman([name] Guarisco) is calling again about the requested letter stating pts condition and she is unable to do jury duty.

## 2015-07-07 ENCOUNTER — Ambulatory Visit (INDEPENDENT_AMBULATORY_CARE_PROVIDER_SITE_OTHER): Payer: Medicare Other | Admitting: Cardiovascular Disease

## 2015-07-07 ENCOUNTER — Encounter: Payer: Self-pay | Admitting: Cardiovascular Disease

## 2015-07-07 VITALS — BP 120/90 | HR 98 | Ht 67.0 in | Wt 169.8 lb

## 2015-07-07 DIAGNOSIS — I63319 Cerebral infarction due to thrombosis of unspecified middle cerebral artery: Secondary | ICD-10-CM | POA: Diagnosis not present

## 2015-07-07 NOTE — Progress Notes (Signed)
Cardiology Office Note   Date:  07/07/2015   ID:  [name], DOB [birth year], MRN [id number]  PCP:  Myrlene Broker, MD  Cardiologist:   Donato Schultz, MD originally ,  Saw [name] in the office   Chief Complaint  Patient presents with  . Cerebrovascular Accident   Problem List 1. CVA- left middle cerebral embolic event 2. ESRD - on HD      History of Present Illness: [name] is a 60 y.o. female who presents for evaluation of her stroke. Was seen with her daughter,  [name]. She has had 2 CVA - March 2016, August 2016   She was originally seen by Dr. Anne Fu and Dr. Graciela Husbands  I have reviewed hospital records and she has never been found to have atrial fib by my review Was evaluated for an implantable loop recoreder .   Dr. Graciela Husbands thought a 30 day monitor would be better . She has not had that monitor yet.   Past Medical History  Diagnosis Date  . Asthma   . Hypertension   . Gout   . Arthritis   . Insomnia   . Depression   . Aortic aneurysm without rupture (HCC)     3cm by CT 08/03/14  . Paroxysmal SVT (supraventricular tachycardia) (HCC)   . Cor pulmonale (HCC)   . PFO (patent foramen ovale)   . COPD (chronic obstructive pulmonary disease) (HCC)   . Shortness of breath dyspnea     on home O2 at 2 L  . Stroke [facility]) 06/2014, 11/2014    still has difficulty getting out some words  . Thyroid disease   . Anxiety   . Chronic kidney disease     dialysis on T/Th/Sa  . Renal insufficiency     Past Surgical History  Procedure Laterality Date  . Multiple tooth extractions    . Av fistula placement Left 01/28/2014    Procedure: ARTERIOVENOUS (AV) FISTULA CREATION;  Surgeon: Sherren Kerns, MD;  Location: [facility] OR;  Service: Vascular;  Laterality: Left;  . Tee without cardioversion N/A 06/23/2014    Procedure: TRANSESOPHAGEAL ECHOCARDIOGRAM (TEE);  Surgeon: Lewayne Bunting, MD;  Location: [facility] ENDOSCOPY;  Service: Cardiovascular;  Laterality: N/A;  .  Insertion of dialysis catheter Right 06/27/2014    Procedure: INSERTION OF Right Internal Jugular DIATEK CATHETER;  Surgeon: Pryor Ochoa, MD;  Location: [facility] OR;  Service: Vascular;  Laterality: Right;  . Laparotomy N/A 07/01/2014    Procedure: EXPLORATORY LAPAROTOMY WITH CLOSURE OF PERFORATED PYLORIC ULCER;  Surgeon: Claud Kelp, MD;  Location: [facility] OR;  Service: General;  Laterality: N/A;  . Bowel resection N/A 07/01/2014    Procedure: SMALL BOWEL RESECTION;  Surgeon: Claud Kelp, MD;  Location: [facility] OR;  Service: General;  Laterality: N/A;  . Revison of arteriovenous fistula Left 09/09/2014    Procedure: LIGATION OF COMPETING BRANCH, & RESECTION OF VENOUS ANEURYSM OF LEFT UPPER ARM ARTERIOVENOUS FISTULA;  Surgeon: Larina Earthly, MD;  Location: [facility] OR;  Service: Vascular;  Laterality: Left;     Current Outpatient Prescriptions  Medication Sig Dispense Refill  . albuterol (PROVENTIL HFA;VENTOLIN HFA) 108 (90 Base) MCG/ACT inhaler Inhale 2 puffs into the lungs every 6 (six) hours as needed for wheezing or shortness of breath. Reported on 07/06/2015    . albuterol (PROVENTIL) (2.5 MG/3ML) 0.083% nebulizer solution Take 3 mLs (2.5 mg total) by nebulization every 6 (six) hours as needed for wheezing or shortness of breath. 75 mL  12  . ALPRAZolam (XANAX) 0.5 MG tablet Xanax 0.25 mg every 8 hours as needed and 0.5 mg every Tuesday Thursday Saturday with dialysis 30 tablet 1  . aspirin 325 MG tablet Take 1 tablet (325 mg total) by mouth daily.    . calcium acetate (PHOSLO) 667 MG capsule Take 667 mg by mouth 3 (three) times daily with meals.    . eszopiclone (LUNESTA) 1 MG TABS tablet Take 1 tablet (1 mg total) by mouth at bedtime as needed for sleep. Take immediately before bedtime 30 tablet 1  . ipratropium-albuterol (DUONEB) 0.5-2.5 (3) MG/3ML SOLN Take 3 mLs by nebulization 3 (three) times daily. 360 mL 1  . levETIRAcetam (KEPPRA) 100 MG/ML solution Take 5 mLs (500 mg total) by mouth 2 (two) times daily.  473 mL 12  . levothyroxine (SYNTHROID, LEVOTHROID) 25 MCG tablet Take 1 tablet (25 mcg total) by mouth daily before breakfast. 30 tablet 1  . loperamide (IMODIUM A-D) 2 MG tablet Take 2 mg by mouth 4 (four) times daily as needed for diarrhea or loose stools.    . metoprolol succinate (TOPROL-XL) 25 MG 24 hr tablet Take 1 tablet (25 mg total) by mouth daily. 90 tablet 3  . mirtazapine (REMERON) 15 MG tablet Place 1 tablet (15 mg total) into feeding tube at bedtime. (Patient taking differently: Take 15 mg by mouth at bedtime. ) 30 tablet 1  . multivitamin (RENA-VIT) TABS tablet Take 1 tablet by mouth daily.    . pantoprazole (PROTONIX) 40 MG tablet Take 40 mg by mouth daily. Reported on 05/25/2015     No current facility-administered medications for this visit.    Allergies:   Heparin    Social History:  The patient  reports that she quit smoking about 8 months ago. Her smoking use included Cigarettes. She has a 10 pack-year smoking history. She has never used smokeless tobacco. She reports that she does not drink alcohol or use illicit drugs.   Family History:  The patient's family history includes Diabetes in her father, mother, and sister; Heart attack in her mother and sister; Heart disease in her brother, father, mother, and sister; Hyperlipidemia in her daughter; Hypertension in her father, mother, and sister; Peripheral vascular disease in her mother and sister.    ROS:  Please see the history of present illness.    Review of Systems: Constitutional:  denies fever, chills, diaphoresis, appetite change and fatigue.  HEENT: denies photophobia, eye pain, redness, hearing loss, ear pain, congestion, sore throat, rhinorrhea, sneezing, neck pain, neck stiffness and tinnitus.  Respiratory: denies SOB, DOE, cough, chest tightness, and wheezing.  Cardiovascular: denies chest pain, palpitations and leg swelling.  Gastrointestinal: denies nausea, vomiting, abdominal pain, diarrhea, constipation,  blood in stool.  Genitourinary: denies dysuria, urgency, frequency, hematuria, flank pain and difficulty urinating.  Musculoskeletal: denies  myalgias, back pain, joint swelling, arthralgias and gait problem.   Skin: denies pallor, rash and wound.  Neurological: admits to CVA symptoms.   Was examined in the chair   Hematological: denies adenopathy, easy bruising, personal or family bleeding history.  Psychiatric/ Behavioral: denies suicidal ideation, mood changes, confusion, nervousness, sleep disturbance and agitation.       All other systems are reviewed and negative.    PHYSICAL EXAM: VS:  BP 120/90 mmHg  Pulse 98  Ht 5\' 7"  (1.702 m)  Wt 169 lb 12.8 oz (77.021 kg)  BMI 26.59 kg/m2 , BMI Body mass index is 26.59 kg/(m^2). GEN: Well nourished, well developed, in  no acute distress HEENT: normal Neck: no JVD, carotid bruits, or masses Cardiac: RRR; no murmurs, rubs, or gallops,no edema  Respiratory:  clear to auscultation bilaterally, normal work of breathing GI: soft, nontender, nondistended, + BS MS: no deformity or atrophy Skin: warm and dry, no rash Neuro:  Basically aphasic.   Able to cooperate with exam  Psych: normal   EKG:  EKG is not ordered today. The ekg ordered 05/23/15   demonstrates sinus tach at 120.  Poor R wave progression    Recent Labs: 11/16/2014: ALT 11*; TSH 2.697 11/18/2014: Magnesium 1.9 05/23/2015: BUN 29*; Creatinine, Ser 6.21*; Hemoglobin 11.6*; Platelets 190; Potassium 4.3; Sodium 133*    Lipid Panel    Component Value Date/Time   CHOL 135 11/11/2014 0330   TRIG 95 11/11/2014 0330   HDL 37* 11/11/2014 0330   CHOLHDL 3.6 11/11/2014 0330   VLDL 19 11/11/2014 0330   LDLCALC 79 11/11/2014 0330      Wt Readings from Last 3 Encounters:  07/07/15 169 lb 12.8 oz (77.021 kg)  06/25/15 170 lb (77.111 kg)  05/25/15 166 lb 12.8 oz (75.66 kg)      Other studies Reviewed: Additional studies/ records that were reviewed today include: . Review of  the above records demonstrates:    ASSESSMENT AND PLAN:  1.  CVA :   The patient was sent to [place] to evaluate for the possiblity of atrial fib. So far, I have not been able to find any evidence of atrial fib.   She has been seen by Dr. Graciela Husbands and he thought that a 30 day monitor would be as useful as an implantable loop recorder. Will order the 30 event monitor today  Would not start anticoagulation unless we find clear evidence of atrial fib. Will see her back in several month s  2. Essential HTN  3. Chronic diastolic dysfunction : has severe LVH  4. Severe pulmonary HTN - likely due to her diastolic dysfunction On dialysis    Current medicines are reviewed at length with the patient today.  The patient does not have concerns regarding medicines.  The following changes have been made:  no change  Labs/ tests ordered today include:   Orders Placed This Encounter  Procedures  . Cardiac event monitor     Disposition:   FU with me in 6 months.     Ebone Alcivar, Deloris Ping, MD  07/07/2015 1:12 PM    [facility] [address] [place], [place], [place]  [address] Phone: [phone]; Fax: [phone]   [facility]  [address] [place], [place]  [address] [phone]   Fax [phone]

## 2015-07-07 NOTE — Patient Instructions (Signed)
Medication Instructions:  Your physician recommends that you continue on your current medications as directed. Please refer to the Current Medication list given to you today.   Labwork: None Ordered   Testing/Procedures: Your physician has recommended that you wear an event monitor. Event monitors are medical devices that record the heart's electrical activity. Doctors most often us these monitors to diagnose arrhythmias. Arrhythmias are problems with the speed or rhythm of the heartbeat. The monitor is a small, portable device. You can wear one while you do your normal daily activities. This is usually used to diagnose what is causing palpitations/syncope (passing out).    Follow-Up: Your physician wants you to follow-up in: 3 months with Dr. Nahser.  You will receive a reminder letter in the mail two months in advance. If you don't receive a letter, please call our office to schedule the follow-up appointment.   If you need a refill on your cardiac medications before your next appointment, please call your pharmacy.   Thank you for choosing [facility]! Michelle Swinyer, RN [phone]    

## 2015-07-08 MED ORDER — SODIUM CHLORIDE 0.9 % IV SOLN
INTRAVENOUS | Status: DC
  Administered 2015-07-09: 10 mL/h via INTRAVENOUS

## 2015-07-08 MED ORDER — DEXTROSE 5 % IV SOLN
1.5000 g | INTRAVENOUS | Status: AC
  Administered 2015-07-09: 1.5 g via INTRAVENOUS
  Filled 2015-07-08: qty 1.5

## 2015-07-09 ENCOUNTER — Encounter (HOSPITAL_COMMUNITY)
Admission: RE | Disposition: A | Discharge status: Home / Self Care | Payer: Self-pay | Source: Ambulatory Visit | Attending: Surgery

## 2015-07-09 ENCOUNTER — Ambulatory Visit (HOSPITAL_COMMUNITY): Payer: Medicare Other | Admitting: Certified Registered Nurse Anesthetist

## 2015-07-09 ENCOUNTER — Ambulatory Visit (HOSPITAL_COMMUNITY)
Admission: RE | Admit: 2015-07-09 | Discharge: 2015-07-09 | Disposition: A | Discharge status: Home / Self Care | Payer: Medicare Other | Source: Ambulatory Visit | Attending: Surgery | Admitting: Surgery

## 2015-07-09 DIAGNOSIS — N186 End stage renal disease: Secondary | ICD-10-CM | POA: Diagnosis not present

## 2015-07-09 DIAGNOSIS — Y832 Surgical operation with anastomosis, bypass or graft as the cause of abnormal reaction of the patient, or of later complication, without mention of misadventure at the time of the procedure: Secondary | ICD-10-CM | POA: Insufficient documentation

## 2015-07-09 DIAGNOSIS — Z87891 Personal history of nicotine dependence: Secondary | ICD-10-CM | POA: Diagnosis not present

## 2015-07-09 DIAGNOSIS — M1990 Unspecified osteoarthritis, unspecified site: Secondary | ICD-10-CM | POA: Insufficient documentation

## 2015-07-09 DIAGNOSIS — I4891 Unspecified atrial fibrillation: Secondary | ICD-10-CM | POA: Insufficient documentation

## 2015-07-09 DIAGNOSIS — Z7982 Long term (current) use of aspirin: Secondary | ICD-10-CM | POA: Diagnosis not present

## 2015-07-09 DIAGNOSIS — Z8673 Personal history of transient ischemic attack (TIA), and cerebral infarction without residual deficits: Secondary | ICD-10-CM | POA: Insufficient documentation

## 2015-07-09 DIAGNOSIS — I120 Hypertensive chronic kidney disease with stage 5 chronic kidney disease or end stage renal disease: Secondary | ICD-10-CM | POA: Diagnosis not present

## 2015-07-09 DIAGNOSIS — T82898A Other specified complication of vascular prosthetic devices, implants and grafts, initial encounter: Secondary | ICD-10-CM | POA: Diagnosis not present

## 2015-07-09 DIAGNOSIS — J449 Chronic obstructive pulmonary disease, unspecified: Secondary | ICD-10-CM | POA: Insufficient documentation

## 2015-07-09 HISTORY — DX: Anxiety disorder, unspecified: F41.9

## 2015-07-09 HISTORY — DX: Reserved for inherently not codable concepts without codable children: IMO0001

## 2015-07-09 HISTORY — DX: Disorder of thyroid, unspecified: E07.9

## 2015-07-09 HISTORY — PX: RESECTION OF ARTERIOVENOUS FISTULA ANEURYSM: SHX6070

## 2015-07-09 LAB — POCT I-STAT 4, (NA,K, GLUC, HGB,HCT)
GLUCOSE: 57 mg/dL — AB (ref 65–99)
HEMATOCRIT: 39 % (ref 36.0–46.0)
HEMOGLOBIN: 13.3 g/dL (ref 12.0–15.0)
Potassium: 4.1 mmol/L (ref 3.5–5.1)
SODIUM: 132 mmol/L — AB (ref 135–145)

## 2015-07-09 LAB — GLUCOSE, CAPILLARY
GLUCOSE-CAPILLARY: 54 mg/dL — AB (ref 65–99)
GLUCOSE-CAPILLARY: 51 mg/dL — AB (ref 65–99)
GLUCOSE-CAPILLARY: 79 mg/dL (ref 65–99)
Glucose-Capillary: 87 mg/dL (ref 65–99)
Glucose-Capillary: 64 mg/dL — ABNORMAL LOW (ref 65–99)
Glucose-Capillary: 71 mg/dL (ref 65–99)

## 2015-07-09 SURGERY — RESECTION OF ARTERIOVENOUS FISTULA ANEURYSM
Anesthesia: Monitor Anesthesia Care | Site: Arm Upper | Laterality: Left

## 2015-07-09 MED ORDER — ALBUTEROL SULFATE HFA 108 (90 BASE) MCG/ACT IN AERS
INHALATION_SPRAY | RESPIRATORY_TRACT | Status: DC | PRN
  Administered 2015-07-09: 2 via RESPIRATORY_TRACT

## 2015-07-09 MED ORDER — OXYCODONE-ACETAMINOPHEN 5-325 MG PO TABS: 1.0000 | ORAL_TABLET | Freq: Four times a day (QID) | ORAL | Status: DC | PRN

## 2015-07-09 MED ORDER — FENTANYL CITRATE (PF) 250 MCG/5ML IJ SOLN
INTRAMUSCULAR | Status: AC
  Filled 2015-07-09: qty 5

## 2015-07-09 MED ORDER — LIDOCAINE HCL (PF) 1 % IJ SOLN
INTRAMUSCULAR | Status: DC | PRN
  Administered 2015-07-09: 30 mL

## 2015-07-09 MED ORDER — SODIUM CHLORIDE 0.9 % IV SOLN
INTRAVENOUS | Status: DC | PRN
  Administered 2015-07-09 (×2): via INTRAVENOUS

## 2015-07-09 MED ORDER — FENTANYL CITRATE (PF) 100 MCG/2ML IJ SOLN: 25.0000 ug | INTRAMUSCULAR | Status: DC | PRN

## 2015-07-09 MED ORDER — 0.9 % SODIUM CHLORIDE (POUR BTL) OPTIME
TOPICAL | Status: DC | PRN
  Administered 2015-07-09: 1000 mL

## 2015-07-09 MED ORDER — PROPOFOL 10 MG/ML IV BOLUS
INTRAVENOUS | Status: AC
  Filled 2015-07-09: qty 20

## 2015-07-09 MED ORDER — OXYCODONE HCL 5 MG/5ML PO SOLN: 5.0000 mg | Freq: Once | ORAL | Status: DC | PRN

## 2015-07-09 MED ORDER — ONDANSETRON HCL 4 MG/2ML IJ SOLN
INTRAMUSCULAR | Status: DC | PRN
  Administered 2015-07-09: 4 mg via INTRAVENOUS

## 2015-07-09 MED ORDER — OXYCODONE HCL 5 MG PO TABS: 5.0000 mg | ORAL_TABLET | Freq: Once | ORAL | Status: DC | PRN

## 2015-07-09 MED ORDER — CHLORHEXIDINE GLUCONATE CLOTH 2 % EX PADS: 6.0000 | MEDICATED_PAD | Freq: Once | CUTANEOUS | Status: DC

## 2015-07-09 MED ORDER — SODIUM CHLORIDE 0.9 % IV SOLN: INTRAVENOUS | Status: DC | PRN

## 2015-07-09 MED ORDER — SODIUM CHLORIDE 0.9 % IV SOLN
INTRAVENOUS | Status: DC
  Administered 2015-07-09: 10 mL/h via INTRAVENOUS

## 2015-07-09 MED ORDER — SODIUM CHLORIDE 0.9 % IV SOLN
INTRAVENOUS | Status: DC | PRN
  Administered 2015-07-09: 500 mL

## 2015-07-09 MED ORDER — LABETALOL HCL 5 MG/ML IV SOLN
INTRAVENOUS | Status: DC | PRN
  Administered 2015-07-09 (×2): 2.5 mg via INTRAVENOUS

## 2015-07-09 MED ORDER — DEXTROSE 50 % IV SOLN
25.0000 mL | Freq: Once | INTRAVENOUS | Status: AC
  Administered 2015-07-09: 25 mL via INTRAVENOUS

## 2015-07-09 MED ORDER — KETAMINE HCL 100 MG/ML IJ SOLN
INTRAMUSCULAR | Status: AC
  Filled 2015-07-09: qty 1

## 2015-07-09 MED ORDER — DEXTROSE 50 % IV SOLN
INTRAVENOUS | Status: AC
  Administered 2015-07-09: .5 via INTRAVENOUS
  Filled 2015-07-09: qty 50

## 2015-07-09 MED ORDER — ONDANSETRON HCL 4 MG/2ML IJ SOLN: 4.0000 mg | Freq: Four times a day (QID) | INTRAMUSCULAR | Status: DC | PRN

## 2015-07-09 MED ORDER — MIDAZOLAM HCL 5 MG/5ML IJ SOLN
INTRAMUSCULAR | Status: DC | PRN
  Administered 2015-07-09 (×2): 1 mg via INTRAVENOUS

## 2015-07-09 MED ORDER — MIDAZOLAM HCL 2 MG/2ML IJ SOLN
INTRAMUSCULAR | Status: AC
  Filled 2015-07-09: qty 2

## 2015-07-09 MED ORDER — KETAMINE HCL 10 MG/ML IJ SOLN
INTRAMUSCULAR | Status: DC | PRN
  Administered 2015-07-09 (×2): 20 mg via INTRAVENOUS

## 2015-07-09 MED ORDER — LIDOCAINE-EPINEPHRINE (PF) 1 %-1:200000 IJ SOLN
INTRAMUSCULAR | Status: AC
  Filled 2015-07-09: qty 30

## 2015-07-09 SURGICAL SUPPLY — 35 items
BANDAGE ELASTIC 4 VELCRO ST LF (GAUZE/BANDAGES/DRESSINGS) ×2 IMPLANT
BNDG GAUZE ELAST 4 BULKY (GAUZE/BANDAGES/DRESSINGS) ×2 IMPLANT
CANISTER SUCTION 2500CC (MISCELLANEOUS) ×3 IMPLANT
CLIP TI MEDIUM 24 (CLIP) ×2 IMPLANT
CLIP TI WIDE RED SMALL 24 (CLIP) ×2 IMPLANT
DECANTER SPIKE VIAL GLASS SM (MISCELLANEOUS) ×3 IMPLANT
ELECT REM PT RETURN 9FT ADLT (ELECTROSURGICAL) ×3
ELECTRODE REM PT RTRN 9FT ADLT (ELECTROSURGICAL) ×1 IMPLANT
GAUZE SPONGE 4X4 12PLY STRL (GAUZE/BANDAGES/DRESSINGS) ×3 IMPLANT
GLOVE BIO SURGEON STRL SZ 6.5 (GLOVE) ×1 IMPLANT
GLOVE BIO SURGEONS STRL SZ 6.5 (GLOVE) ×1
GLOVE BIOGEL PI IND STRL 6.5 (GLOVE) IMPLANT
GLOVE BIOGEL PI IND STRL 7.5 (GLOVE) IMPLANT
GLOVE BIOGEL PI INDICATOR 6.5 (GLOVE) ×4
GLOVE BIOGEL PI INDICATOR 7.5 (GLOVE) ×2
GLOVE ECLIPSE 6.5 STRL STRAW (GLOVE) ×2 IMPLANT
GLOVE ECLIPSE 7.0 STRL STRAW (GLOVE) ×2 IMPLANT
GLOVE SS BIOGEL STRL SZ 7 (GLOVE) IMPLANT
GLOVE SUPERSENSE BIOGEL SZ 7 (GLOVE) ×2
GOWN STRL REUS W/ TWL LRG LVL3 (GOWN DISPOSABLE) ×3 IMPLANT
GOWN STRL REUS W/TWL LRG LVL3 (GOWN DISPOSABLE) ×12
KIT BASIN OR (CUSTOM PROCEDURE TRAY) ×3 IMPLANT
KIT ROOM TURNOVER OR (KITS) ×3 IMPLANT
LIQUID BAND (GAUZE/BANDAGES/DRESSINGS) ×3 IMPLANT
NS IRRIG 1000ML POUR BTL (IV SOLUTION) ×3 IMPLANT
PACK CV ACCESS (CUSTOM PROCEDURE TRAY) ×3 IMPLANT
PAD ARMBOARD 7.5X6 YLW CONV (MISCELLANEOUS) ×6 IMPLANT
SPONGE GAUZE 4X4 12PLY STER LF (GAUZE/BANDAGES/DRESSINGS) ×2 IMPLANT
SUT PROLENE 5 0 C 1 24 (SUTURE) ×8 IMPLANT
SUT PROLENE 6 0 BV (SUTURE) ×3 IMPLANT
SUT VIC AB 3-0 SH 27 (SUTURE) ×6
SUT VIC AB 3-0 SH 27X BRD (SUTURE) ×1 IMPLANT
SUT VICRYL 4-0 PS2 18IN ABS (SUTURE) ×3 IMPLANT
UNDERPAD 30X30 INCONTINENT (UNDERPADS AND DIAPERS) ×3 IMPLANT
WATER STERILE IRR 1000ML POUR (IV SOLUTION) ×3 IMPLANT

## 2015-07-09 NOTE — Progress Notes (Signed)
Notified Dr. Ladene ArtistHodiern of cbg 1971, patient symptomatic.

## 2015-07-09 NOTE — H&P (View-Only) (Signed)
Patient name: [name] MRN: [id number] DOB: 03-20-1956 Sex: female     Chief Complaint  Patient presents with  . New Evaluation    painful AVF aneurysm  left upper arm    HISTORY OF PRESENT ILLNESS: This is a 60 year old female with end-stage renal disease.  She had a left brachiocephalic fistula placed by Dr. Arbie Cookey last year which was also revised with branch ligation.  She has developed a painful aneurysm near her arteriovenous anastomosis.  This is getting bigger.  She also has a separate area of infiltration in the mid upper arm which is aneurysmal.  There are no skin changes or bleeding issues.  The patient has a history of stroke which has affected her verbal communication.  She suffers from atrial fibrillation but is not on anticoagulation.  Past Medical History  Diagnosis Date  . Asthma   . Hypertension   . Gout   . Arthritis   . Insomnia   . Chronic kidney disease   . Depression   . Renal insufficiency   . Aortic aneurysm without rupture (HCC)     3cm by CT 08/03/14  . Paroxysmal SVT (supraventricular tachycardia) (HCC)   . Cor pulmonale (HCC)   . Stroke (HCC) 06/2014  . PFO (patent foramen ovale)   . COPD (chronic obstructive pulmonary disease) [facility])     Past Surgical History  Procedure Laterality Date  . Multiple tooth extractions    . Av fistula placement Left 01/28/2014    Procedure: ARTERIOVENOUS (AV) FISTULA CREATION;  Surgeon: Sherren Kerns, MD;  Location: [facility] OR;  Service: Vascular;  Laterality: Left;  . Tee without cardioversion N/A 06/23/2014    Procedure: TRANSESOPHAGEAL ECHOCARDIOGRAM (TEE);  Surgeon: Lewayne Bunting, MD;  Location: [facility] ENDOSCOPY;  Service: Cardiovascular;  Laterality: N/A;  . Insertion of dialysis catheter Right 06/27/2014    Procedure: INSERTION OF Right Internal Jugular DIATEK CATHETER;  Surgeon: Pryor Ochoa, MD;  Location: [facility] OR;  Service: Vascular;  Laterality: Right;  . Laparotomy N/A 07/01/2014    Procedure:  EXPLORATORY LAPAROTOMY WITH CLOSURE OF PERFORATED PYLORIC ULCER;  Surgeon: Claud Kelp, MD;  Location: [facility] OR;  Service: General;  Laterality: N/A;  . Bowel resection N/A 07/01/2014    Procedure: SMALL BOWEL RESECTION;  Surgeon: Claud Kelp, MD;  Location: [facility] OR;  Service: General;  Laterality: N/A;  . Revison of arteriovenous fistula Left 09/09/2014    Procedure: LIGATION OF COMPETING BRANCH, & RESECTION OF VENOUS ANEURYSM OF LEFT UPPER ARM ARTERIOVENOUS FISTULA;  Surgeon: Larina Earthly, MD;  Location: [facility] OR;  Service: Vascular;  Laterality: Left;    Social History   Social History  . Marital Status: Single    Spouse Name: N/A  . Number of Children: N/A  . Years of Education: N/A   Occupational History  . Not on file.   Social History Main Topics  . Smoking status: Former Smoker -- 0.50 packs/day for 20 years    Types: Cigarettes  . Smokeless tobacco: Never Used  . Alcohol Use: No     Comment: occ  . Drug Use: No  . Sexual Activity: Not on file   Other Topics Concern  . Not on file   Social History Narrative    Family History  Problem Relation Age of Onset  . Diabetes Mother   . Hypertension Mother   . Heart disease Mother   . Heart attack Mother   . Peripheral vascular disease Mother   .  Diabetes Father   . Heart disease Father   . Hypertension Father   . Diabetes Sister   . Hypertension Sister   . Heart disease Sister     before age 960  . Heart attack Sister   . Peripheral vascular disease Sister   . Heart disease Brother   . Hyperlipidemia Daughter     Allergies as of 06/25/2015 - Review Complete 06/25/2015  Allergen Reaction Noted  . Heparin Other (See Comments) 08/20/2014    Current Outpatient Prescriptions on File Prior to Visit  Medication Sig Dispense Refill  . albuterol (PROVENTIL) (2.5 MG/3ML) 0.083% nebulizer solution Take 3 mLs (2.5 mg total) by nebulization every 6 (six) hours as needed for wheezing or shortness of breath. 75 mL 12  .  ALPRAZolam (XANAX) 0.5 MG tablet Xanax 0.25 mg every 8 hours as needed and 0.5 mg every Tuesday Thursday Saturday with dialysis 30 tablet 1  . aspirin 325 MG tablet Take 1 tablet (325 mg total) by mouth daily.    . budesonide (PULMICORT) 0.25 MG/2ML nebulizer solution Take 2 mLs (0.25 mg total) by nebulization 2 (two) times daily. 60 mL 12  . eszopiclone (LUNESTA) 1 MG TABS tablet Take 1 tablet (1 mg total) by mouth at bedtime as needed for sleep. Take immediately before bedtime 30 tablet 1  . ipratropium-albuterol (DUONEB) 0.5-2.5 (3) MG/3ML SOLN Take 3 mLs by nebulization 3 (three) times daily. 360 mL 1  . levETIRAcetam (KEPPRA) 100 MG/ML solution Take 5 mLs (500 mg total) by mouth 2 (two) times daily. 473 mL 12  . levothyroxine (SYNTHROID, LEVOTHROID) 25 MCG tablet Take 1 tablet (25 mcg total) by mouth daily before breakfast. 30 tablet 1  . metoprolol succinate (TOPROL-XL) 25 MG 24 hr tablet Take 1 tablet (25 mg total) by mouth daily. 90 tablet 3  . mirtazapine (REMERON) 15 MG tablet Place 1 tablet (15 mg total) into feeding tube at bedtime. (Patient taking differently: Take 15 mg by mouth at bedtime. ) 30 tablet 1  . pantoprazole (PROTONIX) 40 MG tablet Take 40 mg by mouth daily. Reported on 05/25/2015    . predniSONE (DELTASONE) 50 MG tablet Please take one pill per day for 4 days 4 tablet 0  . azithromycin (ZITHROMAX) 250 MG tablet Please take 2 tablets on day 1, 1 tablet for next 4 days. (Patient not taking: Reported on 06/25/2015) 6 each 0   No current facility-administered medications on file prior to visit.     REVIEW OF SYSTEMS: Unable to obtain from patient as she is noncommunicative given her stroke  PHYSICAL EXAMINATION:   Vital signs are  Filed Vitals:   06/25/15 0953 06/25/15 0957  BP: 152/116 156/115  Pulse: 70 70  Temp: 98.5 F (36.9 C)   Resp: 16   Height: 5\' 7"  (1.702 m)   Weight: 170 lb (77.111 kg)   SpO2: 100%    Body mass index is 26.62 kg/(m^2). General: The  patient appears their stated age. HEENT:  No gross abnormalities Pulmonary:  Non labored breathing Musculoskeletal: There are no major deformities. Neurologic: No focal weakness or paresthesias are detected, Skin: There are no ulcer or rashes noted. Psychiatric: The patient has normal affect. Cardiovascular: Excellent thrill within her left brachiocephalic fistula.  Just past her arteriovenous anastomosis there is a lobulated large aneurysm which is tender to the touch.  There are no skin changes.  There is also a second aneurysm in the mid fistula which is consistent with the patient describes an  infiltration.   Diagnostic Studies None  Assessment: Left brachiocephalic fistula aneurysm Plan: I discussed with the patient that I would recommend treating the aneurysm near the arch of venous anastomosis initially.  This is the one that is painful and lobulated.  I think this can be done with plication.  I told her I would not address the aneurysm in the mid fistula as I think if I do, she would require a catheter for one month while the incisions heal.  In addition I think this area may go on to decrease in size given that it was secondary to an infiltration.  She is in agreement with this.  I have scheduled her operation for Tuesday, April 4  V. Charlena Cross, M.D. [facility] Office: [phone] Pager:  [phone]

## 2015-07-09 NOTE — Progress Notes (Signed)
Dr Chaney MallingHodierne called and informed of CBG of 64 no new orders.

## 2015-07-09 NOTE — Anesthesia Postprocedure Evaluation (Signed)
Anesthesia Post Note  Patient: [name]  Procedure(s) Performed: Procedure(s) (LRB): LEFT UPPER ARM  ARTERIOVENOUS FISTULA  ANEURYSMORRPHY (Left)  Patient location during evaluation: PACU Anesthesia Type: General Level of consciousness: awake and alert Pain management: pain level controlled Vital Signs Assessment: post-procedure vital signs reviewed and stable Respiratory status: spontaneous breathing, nonlabored ventilation and respiratory function stable Cardiovascular status: blood pressure returned to baseline and stable Postop Assessment: no signs of nausea or vomiting Anesthetic complications: no    Last Vitals:  Filed Vitals:   07/09/15 1600 07/09/15 1602  BP:  136/96  Pulse:    Temp: 36.5 C   Resp: 23 25    Last Pain:  Filed Vitals:   07/09/15 1615  PainSc: 0-No pain                 Kiev Labrosse A

## 2015-07-09 NOTE — Anesthesia Preprocedure Evaluation (Addendum)
Anesthesia Evaluation  Patient identified by MRN, date of birth, ID band Patient awake    Reviewed: Allergy & Precautions, NPO status , Patient's Chart, lab work & pertinent test results  Airway Mallampati: II  TM Distance: >3 FB Neck ROM: full    Dental  (+) Upper Dentures, Lower Dentures, Dental Advisory Given   Pulmonary shortness of breath, asthma , COPD, former smoker,    breath sounds clear to auscultation       Cardiovascular hypertension, + Peripheral Vascular Disease   Rhythm:regular Rate:Normal     Neuro/Psych Anxiety Depression CVA    GI/Hepatic PUD,   Endo/Other    Renal/GU ESRF and DialysisRenal disease     Musculoskeletal  (+) Arthritis ,   Abdominal   Peds  Hematology   Anesthesia Other Findings   Reproductive/Obstetrics                            Anesthesia Physical Anesthesia Plan  ASA: III  Anesthesia Plan: MAC   Post-op Pain Management:    Induction: Intravenous  Airway Management Planned: Simple Face Mask  Additional Equipment:   Intra-op Plan:   Post-operative Plan:   Informed Consent: I have reviewed the patients History and Physical, chart, labs and discussed the procedure including the risks, benefits and alternatives for the proposed anesthesia with the patient or authorized representative who has indicated his/her understanding and acceptance.   Dental advisory given  Plan Discussed with: CRNA, Anesthesiologist and Surgeon  Anesthesia Plan Comments:        Anesthesia Quick Evaluation

## 2015-07-09 NOTE — Op Note (Signed)
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 After infiltration of 1% Xylocaine with epinephrine a longitudinal incision was made over this multilobed aneurysm which involved the brachiocephalic fistula over a distance of about 6 cm near the arterial anastomosis but not involving the arterial anastomosis. Proximal distal control of the fistula was obtained which was diffusely dilated but severely aneurysmal in this one segment. The skin was mobilized medially and laterally so that the aneurysm was circumferentially mobilized. The fistula was then occluded proximally and distally with fistula clamps. Aneurysm segment of the fistula was and opened longitudinally throughout and the thin areas excised leaving the posterior wall intact. The wall was very smooth and glistening. After excising the aneurysmal portions the longitudinal opening in the fistula was reclosed with 2 continuous 50 Prolenes from each and. Following antegrade and retrograde flushing closure was completed reestablishment of flow with an excellent pulse and palpable thrill. No heparin or heparinized saline was utilized only regular saline because of a question of heparin allergy. Adequate hemostasis was achieved wound closed in layers with Vicryl in subcuticular fashion with Dermabond sterile dressing applied including 4054 hours  Kerlix and Ace wrap patient taken recovery room in satisfactory condition   Josephina GipJames Naja Apperson, MD 07/09/2015 2:47 PM

## 2015-07-09 NOTE — Interval H&P Note (Signed)
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 After consideration of risks, benefits and other options for treatment, the patient has consented to  Procedure(s): RESECTION OF ARTERIOVENOUS FISTULA ANEURYSM (Left) as a surgical intervention .  The patient's history has been reviewed, patient examined, no change in status, stable for surgery.  I have reviewed the patient's chart and labs.  Questions were answered to the patient's satisfaction.     Josephina GipLawson, James

## 2015-07-09 NOTE — Transfer of Care (Signed)
Immediate Anesthesia Transfer of Care Note  Patient: [name]  Procedure(s) Performed: Procedure(s): LEFT UPPER ARM  ARTERIOVENOUS FISTULA  ANEURYSMORRPHY (Left)  Patient Location: PACU  Anesthesia Type:MAC  Level of Consciousness: awake, alert  and oriented  Airway & Oxygen Therapy: Patient Spontanous Breathing and Patient connected to nasal cannula oxygen  Post-op Assessment: Report given to RN and Post -op Vital signs reviewed and stable  Post vital signs: Reviewed and stable  Last Vitals:  Filed Vitals:   07/09/15 0937 07/09/15 1501  BP: 155/100   Pulse: 85   Temp: 36.3 C 36.9 C  Resp: 18     Complications: No apparent anesthesia complications

## 2015-07-09 NOTE — Progress Notes (Signed)
Dr Chaney MallingHodierne called and informed of glucose results new orders noted.

## 2015-07-10 ENCOUNTER — Telehealth: Payer: Self-pay | Admitting: Vascular Surgery

## 2015-07-10 NOTE — Telephone Encounter (Signed)
sched appt 4/25 at 3:30. Spoke to pt's daughter to inform pt of appt.

## 2015-07-10 NOTE — Telephone Encounter (Signed)
-----   Message from Sharee PimpleMarilyn K McChesney, RN sent at 07/09/2015  3:04 PM EDT ----- Regarding: schedule   ----- Message -----    From: Lars MageEmma M Collins, PA-C    Sent: 07/09/2015   2:46 PM      To: [facility] Charge Pool  F/U with Dr. Hart RochesterLawson in 2-3 weeks s/p revision left av fistula aneurysm.

## 2015-07-13 ENCOUNTER — Encounter (HOSPITAL_COMMUNITY): Payer: Self-pay | Admitting: Vascular Surgery

## 2015-07-13 ENCOUNTER — Ambulatory Visit (INDEPENDENT_AMBULATORY_CARE_PROVIDER_SITE_OTHER): Payer: Medicare Other

## 2015-07-13 DIAGNOSIS — I63319 Cerebral infarction due to thrombosis of unspecified middle cerebral artery: Secondary | ICD-10-CM

## 2015-07-16 ENCOUNTER — Telehealth: Payer: Self-pay | Admitting: *Deleted

## 2015-07-16 NOTE — Telephone Encounter (Signed)
Daughter returned call.  York SpanielSaid that her mother didn't feel any kind of symptoms during this time.  York SpanielSaid thay may have been coming up stairs around that time.

## 2015-07-16 NOTE — Telephone Encounter (Signed)
Received [employer] monitor episode dated 07/15/15 @11 :54am.  Called patient and left message to call triage so that we can ask her about the episode.

## 2015-07-16 NOTE — Telephone Encounter (Signed)
Talked to Dr Elberta Fortisamnitz, DOD.  He recommended having her come in for evaluation to start anti-coagulation therapy.  Scheduled appt for 07-19-15 @1100  for this.  Called patient and advised the daughter of this appt.  She said that they will be here for that.

## 2015-07-16 NOTE — Telephone Encounter (Signed)
New Message:  [name] was calling in to report an abnormal EKG for the pt. Please f/u with him

## 2015-07-19 ENCOUNTER — Ambulatory Visit (INDEPENDENT_AMBULATORY_CARE_PROVIDER_SITE_OTHER): Payer: Medicare Other | Admitting: Cardiology

## 2015-07-19 ENCOUNTER — Telehealth: Payer: Self-pay | Admitting: Internal Medicine

## 2015-07-19 ENCOUNTER — Encounter: Payer: Self-pay | Admitting: Cardiology

## 2015-07-19 VITALS — BP 124/82 | HR 104 | Ht 67.0 in | Wt 177.0 lb

## 2015-07-19 DIAGNOSIS — I63319 Cerebral infarction due to thrombosis of unspecified middle cerebral artery: Secondary | ICD-10-CM | POA: Diagnosis not present

## 2015-07-19 DIAGNOSIS — I4892 Unspecified atrial flutter: Secondary | ICD-10-CM | POA: Diagnosis not present

## 2015-07-19 NOTE — Patient Instructions (Signed)
Medication Instructions:   Your physician recommends that you continue on your current medications as directed. Please refer to the Current Medication list given to you today.   If you need a refill on your cardiac medications before your next appointment, please call your pharmacy.  Labwork:  NONE ORDER TODAY    Testing/Procedures: NONE ORDER TODAY    Follow-Up:   CALL BACK ONCE YOU HAVE SEEN PRIMARY CARE DOCTER  FOR AN FOLLOW UP WITH BRITTAINY SIMMONS PA-C   Any Other Special Instructions Will Be Listed Below (If Applicable).

## 2015-07-19 NOTE — Progress Notes (Signed)
07/19/2015 [name]   [birth year]  [id number]  Primary Physician Myrlene BrokerElizabeth A Crawford, MD Primary Cardiologist: Dr. Elease HashimotoNahser Electrophysiologist: Dr. Graciela HusbandsKlein  Reason for Visit/CC: Paroxsysmal atrial flutter/ ? 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 She also has a h/o ESRD on HD, T,TH, Sat. Also with h/o HTN.  She was referred to EP and seen by Dr. Graciela HusbandsKlein for evaluation of implantable loop recorder versus 30 day monitor. He recommended a 30 day monitor to assess for atrial fibrillation. This was recently placed the first part of April. On 04/011/2017, our office was notified of atrial flutter. Her telemetry strips were reviewed by [facility], who recommended that she be reevaluated and placed on anticoagulation therapy in the setting of atrial flutter and recent CVA.  The patient reports to clinic with her daughter. In review of her records, she was also treated in March 2016 for a perforated pyloric ulcer and required emergency surgery. She has had no recurrent issues since. She is currently on high dose ASA, 325 mg daily. Hgb was recently checked and was normal at 13.      Current Outpatient Prescriptions  Medication Sig Dispense Refill  . albuterol (PROVENTIL HFA;VENTOLIN HFA) 108 (90 Base) MCG/ACT inhaler Inhale 2 puffs into the lungs every 6 (six) hours as needed for wheezing or shortness of breath. Reported on 07/06/2015    . albuterol (PROVENTIL) (2.5 MG/3ML) 0.083% nebulizer solution Take 3 mLs (2.5 mg total) by nebulization every 6 (six) hours as needed for wheezing or shortness of breath. 75 mL 12  . ALPRAZolam (XANAX) 0.5 MG tablet Xanax 0.25 mg every 8 hours as needed and 0.5 mg every Tuesday Thursday Saturday with dialysis 30 tablet 1  . aspirin 325 MG tablet Take 1 tablet (325 mg total) by mouth daily.    . calcium acetate (PHOSLO) 667 MG capsule Take 667 mg by mouth 3  (three) times daily with meals.    . eszopiclone (LUNESTA) 1 MG TABS tablet Take 1 tablet (1 mg total) by mouth at bedtime as needed for sleep. Take immediately before bedtime 30 tablet 1  . ipratropium-albuterol (DUONEB) 0.5-2.5 (3) MG/3ML SOLN Take 3 mLs by nebulization 3 (three) times daily. 360 mL 1  . levETIRAcetam (KEPPRA) 100 MG/ML solution Take 5 mLs (500 mg total) by mouth 2 (two) times daily. 473 mL 12  . levothyroxine (SYNTHROID, LEVOTHROID) 25 MCG tablet Take 1 tablet (25 mcg total) by mouth daily before breakfast. 30 tablet 1  . loperamide (IMODIUM A-D) 2 MG tablet Take 2 mg by mouth 4 (four) times daily as needed for diarrhea or loose stools.    . metoprolol succinate (TOPROL-XL) 25 MG 24 hr tablet Take 1 tablet (25 mg total) by mouth daily. 90 tablet 3  . mirtazapine (REMERON) 15 MG tablet Place 1 tablet (15 mg total) into feeding tube at bedtime. (Patient taking differently: Take 15 mg by mouth at bedtime. ) 30 tablet 1  . multivitamin (RENA-VIT) TABS tablet Take 1 tablet by mouth daily.    Marland Kitchen. oxyCODONE-acetaminophen (PERCOCET/ROXICET) 5-325 MG tablet Take 1 tablet by mouth every 6 (six) hours as needed. 30 tablet 0  . pantoprazole (PROTONIX) 40 MG tablet Take 40 mg by mouth daily. Reported on 05/25/2015     No current facility-administered medications for this visit.    Allergies  Allergen Reactions  . Heparin Other (See Comments)  Heparin antibody positive; SRA negative    Social History   Social History  . Marital Status: Single    Spouse Name: N/A  . Number of Children: N/A  . Years of Education: N/A   Occupational History  . Not on file.   Social History Main Topics  . Smoking status: Former Smoker -- 0.50 packs/day for 20 years    Types: Cigarettes    Quit date: 11/05/2014  . Smokeless tobacco: Never Used  . Alcohol Use: No     Comment: occ  . Drug Use: No  . Sexual Activity: Not on file   Other Topics Concern  . Not on file   Social History  Narrative     Review of Systems: General: negative for chills, fever, night sweats or weight changes.  Cardiovascular: negative for chest pain, dyspnea on exertion, edema, orthopnea, palpitations, paroxysmal nocturnal dyspnea or shortness of breath Dermatological: negative for rash Respiratory: negative for cough or wheezing Urologic: negative for hematuria Abdominal: negative for nausea, vomiting, diarrhea, bright red blood per rectum, melena, or hematemesis Neurologic: negative for visual changes, syncope, or dizziness All other systems reviewed and are otherwise negative except as noted above.    Blood pressure 124/82, pulse 104, height  (1.702 m), weight 177 lb (80.287 kg).  General appearance: alert, cooperative and no distress Neck: no carotid bruit and no JVD Lungs: clear to auscultation bilaterally Heart: regular rate and rhythm, S1, S2 normal, no murmur, click, rub or gallop Extremities: 1+ LEE bilaterally Pulses: 2+ and symmetric Skin: warm and dry Neurologic: Grossly normal  EKG not performed. 30 day monitor has shown PAF.   ASSESSMENT AND PLAN:   1. PAF: asymptomatic with her arrhthymia. HR is controlled in the upper 90s. Her CHA2DS2 VASc score is 4, which includes prior stroke x 2. However, in review of her records, she was also treated in March 2016 for a perforated pyloric ulcer and required emergency surgery. I've discussed starting anticoagulation with her primary cardiologist, Dr. Elease Hashimoto. Given her h/o perforated gastric ulcer, we will need clearance by her primary MD to start anticoagulation, as we will need to ensure that the benefit would be greater than potential risk for bleed. We will therefore hold off on starting anticoagulation until we receive recommendations by either Dr. Okey Dupre or Dr. Derrell Lolling. If ok to start anticoagulation, she would need to be placed on Coumadin as she is not a candidate for NOAC as she is on HD.   2. ESRD: on HD T, TH, Sat.   3.  H/o CVA: atrial flutter recently captured on ambulatory monitor. Will plan to anticoagulate with Coumadin, if cleared by her primary MD, as outlined above.   4. HTN: BP is controlled on current regimen.   Robbie Lis PA-C 07/19/2015 12:01 PM

## 2015-07-19 NOTE — Telephone Encounter (Signed)
States patient is being charged of oxygen.  Please follow up in regards.

## 2015-07-20 NOTE — Telephone Encounter (Signed)
Left message for patient to call back  

## 2015-07-21 ENCOUNTER — Encounter: Payer: Self-pay | Admitting: Vascular Surgery

## 2015-07-21 ENCOUNTER — Ambulatory Visit (INDEPENDENT_AMBULATORY_CARE_PROVIDER_SITE_OTHER): Payer: Medicare Other | Admitting: Family

## 2015-07-21 VITALS — BP 124/80 | Temp 97.5°F | Wt 180.0 lb

## 2015-07-21 DIAGNOSIS — I63319 Cerebral infarction due to thrombosis of unspecified middle cerebral artery: Secondary | ICD-10-CM | POA: Diagnosis not present

## 2015-07-21 DIAGNOSIS — M7989 Other specified soft tissue disorders: Secondary | ICD-10-CM

## 2015-07-21 NOTE — Patient Instructions (Addendum)
As we discussed, I believe the swelling in her ankles and feet is related to your compromise kidney function.  By medical supply store and get compression stockings.  Before we start any diuretics ( especially with kidney function), we agreed that it is reasonable to try compression stockings and elevating your feet to see if this remedies the swelling.   Peripheral Edema You have swelling in your legs (peripheral edema). This swelling is due to excess accumulation of salt and water in your body. Edema may be a sign of heart, kidney or liver disease, or a side effect of a medication. It may also be due to problems in the leg veins. Elevating your legs and using special support stockings may be very helpful, if the cause of the swelling is due to poor venous circulation. Avoid long periods of standing, whatever the cause. Treatment of edema depends on identifying the cause. Chips, pretzels, pickles and other salty foods should be avoided. Restricting salt in your diet is almost always needed. Water pills (diuretics) are often used to remove the excess salt and water from your body via urine. These medicines prevent the kidney from reabsorbing sodium. This increases urine flow. Diuretic treatment may also result in lowering of potassium levels in your body. Potassium supplements may be needed if you have to use diuretics daily. Daily weights can help you keep track of your progress in clearing your edema. You should call your caregiver for follow up care as recommended. SEEK IMMEDIATE MEDICAL CARE IF:   You have increased swelling, pain, redness, or heat in your legs.  You develop shortness of breath, especially when lying down.  You develop chest or abdominal pain, weakness, or fainting.  You have a fever.   This information is not intended to replace advice given to you by your health care provider. Make sure you discuss any questions you have with your health care provider.   Document  Released: 04/27/2004 Document Revised: 06/12/2011 Document Reviewed: 09/30/2014 Elsevier Interactive Patient Education [employer]2016 Elsevier Inc.

## 2015-07-21 NOTE — Progress Notes (Signed)
Pre visit review using our clinic review tool, if applicable. No additional management support is needed unless otherwise documented below in the visit note. 

## 2015-07-21 NOTE — Progress Notes (Signed)
Subjective:    [name], female    DOB: 12-29-1955, 60 y.o.   MRN: [id number]   [name] is a 60 y.o. female who presents today for an acute visit.    HPI Comments: Evaluation of ankle and foot about two weeks ago, unchanged. Mild pain. Swelling improves when laying down. Hasn't worn compression hose or propped up feet per daughter. Denies cough. SOB at baseline for COPD. No h/o DVTs.   Per chart review, patient had CVA 2016 .She saw her cardiologist yesterday; she started wearing a heart monitor beginning of April. There was episode of proximal atrial fibrillation 4/11 and recommended to start anticoagulation.Patient also has a history of perforated ulcer   Past Medical History  Diagnosis Date  . Asthma   . Hypertension   . Gout   . Arthritis   . Insomnia   . Depression   . Aortic aneurysm without rupture (HCC)     3cm by CT 08/03/14  . Paroxysmal SVT (supraventricular tachycardia) (HCC)   . Cor pulmonale (HCC)   . PFO (patent foramen ovale)   . COPD (chronic obstructive pulmonary disease) (HCC)   . Shortness of breath dyspnea     on home O2 at 2 L  . Stroke [facility](HCC) 06/2014, 11/2014    still has difficulty getting out some words  . Thyroid disease   . Anxiety   . Chronic kidney disease     dialysis on T/Th/Sa  . Renal insufficiency    Heparin Current Outpatient Prescriptions on File Prior to Visit  Medication Sig Dispense Refill  . albuterol (PROVENTIL HFA;VENTOLIN HFA) 108 (90 Base) MCG/ACT inhaler Inhale 2 puffs into the lungs every 6 (six) hours as needed for wheezing or shortness of breath. Reported on 07/06/2015    . albuterol (PROVENTIL) (2.5 MG/3ML) 0.083% nebulizer solution Take 3 mLs (2.5 mg total) by nebulization every 6 (six) hours as needed for wheezing or shortness of breath. 75 mL 12  . ALPRAZolam (XANAX) 0.5 MG tablet Xanax 0.25 mg every 8 hours as needed and 0.5 mg every Tuesday Thursday Saturday with dialysis 30 tablet 1  . aspirin 325 MG  tablet Take 1 tablet (325 mg total) by mouth daily.    . calcium acetate (PHOSLO) 667 MG capsule Take 667 mg by mouth 3 (three) times daily with meals.    . eszopiclone (LUNESTA) 1 MG TABS tablet Take 1 tablet (1 mg total) by mouth at bedtime as needed for sleep. Take immediately before bedtime 30 tablet 1  . ipratropium-albuterol (DUONEB) 0.5-2.5 (3) MG/3ML SOLN Take 3 mLs by nebulization 3 (three) times daily. 360 mL 1  . levETIRAcetam (KEPPRA) 100 MG/ML solution Take 5 mLs (500 mg total) by mouth 2 (two) times daily. 473 mL 12  . levothyroxine (SYNTHROID, LEVOTHROID) 25 MCG tablet Take 1 tablet (25 mcg total) by mouth daily before breakfast. 30 tablet 1  . loperamide (IMODIUM A-D) 2 MG tablet Take 2 mg by mouth 4 (four) times daily as needed for diarrhea or loose stools.    . metoprolol succinate (TOPROL-XL) 25 MG 24 hr tablet Take 1 tablet (25 mg total) by mouth daily. 90 tablet 3  . mirtazapine (REMERON) 15 MG tablet Place 1 tablet (15 mg total) into feeding tube at bedtime. (Patient taking differently: Take 15 mg by mouth at bedtime. ) 30 tablet 1  . multivitamin (RENA-VIT) TABS tablet Take 1 tablet by mouth daily.    Marland Kitchen. oxyCODONE-acetaminophen (PERCOCET/ROXICET) 5-325 MG  tablet Take 1 tablet by mouth every 6 (six) hours as needed. 30 tablet 0  . pantoprazole (PROTONIX) 40 MG tablet Take 40 mg by mouth daily. Reported on 05/25/2015     No current facility-administered medications on file prior to visit.    Social History  Substance Use Topics  . Smoking status: Former Smoker -- 0.50 packs/day for 20 years    Types: Cigarettes    Quit date: 11/05/2014  . Smokeless tobacco: Never Used  . Alcohol Use: No     Comment: occ    Review of Systems  Constitutional: Negative for fever and chills.  Cardiovascular: Positive for leg swelling. Negative for chest pain and palpitations.  Gastrointestinal: Negative for nausea and vomiting.  Skin: Negative for rash and wound.      Objective:      BP 124/80 mmHg  Temp(Src) 97.5 F (36.4 C) (Oral)  Wt 180 lb (81.647 kg)   Physical Exam  Constitutional: She appears well-developed and well-nourished.  Eyes: Conjunctivae are normal.  Cardiovascular: Normal rate, regular rhythm, normal heart sounds and normal pulses.   Bilateral LE non pitting edema over ankles and dorsal aspect of foot. No palpable cords or masses over calves.  No erythema or increased warmth to suggest inflammation. Negative Homan sign bilaterally.  LE hair growth absent. Skin tight and shiny, mild hyperpigmentation. +1 pedal pulses. LE warm.    Pulmonary/Chest: Effort normal and breath sounds normal. She has no wheezes. She has no rhonchi. She has no rales.  Neurological: She is alert.  Severe expressive aphasia. Difficult to understand speech. Using walker.   Skin: Skin is warm and dry.  Psychiatric: She has a normal mood and affect. Her speech is normal and behavior is normal. Thought content normal.  Vitals reviewed.      Assessment & Plan:   1. Leg swelling  - Compression stockings - Elevation of legs  End-stage renal disease. Concerned that starting diuretics today would compromise existing renal function. Daughter, patient, and I agreed to treat conservatively today with a prescription for compression hose. She has not been elevating her legs during the day per daughter, I emphasized the importance of this as well.  I am having [name] maintain her aspirin, ipratropium-albuterol, levETIRAcetam, levothyroxine, mirtazapine, albuterol, pantoprazole, metoprolol succinate, eszopiclone, ALPRAZolam, multivitamin, calcium acetate, albuterol, loperamide, and oxyCODONE-acetaminophen.  There is ongoing discussion about whether or not she should start anticoagulation therapy for her proximal atrial fibrillation.Advised patient to make appointment with PCP ASAP to discuss starting anticoagulations therapy in the context of her perforated pyloric ulcer and  ESRD.  Gave note to excuse patient from jury duty in [place] 2017  Start medications as prescribed and explained to patient on After Visit Summary ( AVS). Risks, benefits, and alternatives of the medications and treatment plan prescribed today were discussed, and patient expressed understanding.   Education regarding symptom management and diagnosis given to patient.   Follow-up:Plan follow-up as discussed or as needed if any worsening symptoms or change in condition. Return in about 1 week (around 07/28/2015).   Continue to follow with Myrlene Broker, MD for routine health maintenance.   Lekeisha Mikeal Hawthorne and I agreed with plan.   Rennie Plowman, FNP  25 minutes spent with patient. > 50% of time spent counseling patient related to diagnosis of LE swelling in context of co morbidities.

## 2015-07-26 ENCOUNTER — Telehealth: Payer: Self-pay

## 2015-07-26 NOTE — Telephone Encounter (Signed)
Patient states she is being charged from oxygen. And she has not got a call back about it yet. She needs some form filled out. Please follow up.

## 2015-07-26 NOTE — Telephone Encounter (Signed)
Form has been corrected and faxed back to [facility].

## 2015-07-26 NOTE — Telephone Encounter (Signed)
Spoke with patient. She has to fill out her part before Dr. Okey [name] can fill the forms out. Patient will be here on Wednesday and will pick the form up then.

## 2015-07-26 NOTE — Telephone Encounter (Signed)
Patients dauthger needs FML papers today before 1pm. She states she has been waiting since last week. Please follow up.

## 2015-07-27 ENCOUNTER — Ambulatory Visit (INDEPENDENT_AMBULATORY_CARE_PROVIDER_SITE_OTHER): Payer: Self-pay | Admitting: Vascular Surgery

## 2015-07-27 VITALS — BP 161/106 | HR 117 | Temp 98.4°F | Resp 24 | Ht 67.0 in | Wt 177.0 lb

## 2015-07-27 DIAGNOSIS — Z992 Dependence on renal dialysis: Secondary | ICD-10-CM

## 2015-07-27 DIAGNOSIS — N186 End stage renal disease: Secondary | ICD-10-CM | POA: Insufficient documentation

## 2015-07-27 NOTE — Progress Notes (Signed)
Subjective:     [name], female   DOB: 1955-05-07, 60 y.o.   MRN: [id number]  HPI  This 60 year old female had aneurysmorrhaphy of left brachial-cephalic AV fistula performed by me on 07/09/2015. She has continued to have dialysis using the fistula with punctures in a more proximal location. She had one episode of infiltration she states but otherwise the fistula has functioned nicely.   Review of Systems     Objective:   Physical Exam BP 161/106 mmHg  Pulse 117  Temp(Src) 98.4 F (36.9 C)  Resp 24  Ht 5\' 7"  (1.702 m)  Wt 177 lb (80.287 kg)  BMI 27.72 kg/m2  SpO2 97%   Gen. Elderly female no apparent distress alert and oriented 3 Left upper extremity with nicely healing distal upper arm incision where the aneurysmorrhaphy was performed. There is excellent pulse and palpable thrill in the fistula. 2+ radial pulse palpable distally with well-perfused left hand.     Assessment:      nicely functioning left brachial-cephalic AV fistula following aneurysmorrhaphy on 07/09/2015     Plan:      continue dialysis as patient is doing with avoidance of incisional area until 09/08/2015 or longer if able  Return to see me on when necessary basis

## 2015-07-27 NOTE — Progress Notes (Signed)
Filed Vitals:   07/27/15 1527 07/27/15 1528  BP: 155/105 161/106  Pulse: 118 117  Temp: 98.4 F (36.9 C)   Resp: 24   Height: 5\' 7"  (1.702 m)   Weight: 177 lb (80.287 kg)   SpO2: 97%

## 2015-07-28 ENCOUNTER — Ambulatory Visit (INDEPENDENT_AMBULATORY_CARE_PROVIDER_SITE_OTHER): Payer: Medicare Other | Admitting: Internal Medicine

## 2015-07-28 ENCOUNTER — Encounter: Payer: Self-pay | Admitting: Internal Medicine

## 2015-07-28 VITALS — BP 138/88 | HR 76 | Temp 98.4°F | Resp 16 | Ht 67.0 in | Wt 178.8 lb

## 2015-07-28 DIAGNOSIS — I480 Paroxysmal atrial fibrillation: Secondary | ICD-10-CM

## 2015-07-28 DIAGNOSIS — I63319 Cerebral infarction due to thrombosis of unspecified middle cerebral artery: Secondary | ICD-10-CM

## 2015-07-28 NOTE — Progress Notes (Signed)
Pre visit review using our clinic review tool, if applicable. No additional management support is needed unless otherwise documented below in the visit note. 

## 2015-07-28 NOTE — Patient Instructions (Signed)
We will fill out and send back the forms for you.   I would say that you are too high risk for bleeding to go on a strong blood thinner as you would have about a 10% chance for serious bleeding. You also have a risk of about 10% per year of having another stroke so we need to keep you on track with medicines to help keep your risk of stroke as low as possible.

## 2015-07-31 NOTE — Progress Notes (Signed)
   Subjective:    [name], female    DOB: July 14, 1955, 60 y.o.   MRN: [id number]  HPI The patient is a 60 YO female coming in for counseling of risk of anticoagulation and bleeding risk in regards to her recent diagnosis of PAF. She saw cardiology and wore event monitor. They were not comfortable starting anticoagulation given that she had PUD bleed with surgical correction in the last year. They are able to confirm that this was a serious bleeding episode and that she required many transfusions (as I was not her PCP during that time). She has had recent severe stroke within the last 6 months. No new symptoms of stroke since that time.   Review of Systems  Constitutional: Negative for fever, activity change, appetite change and fatigue.  HENT: Negative.   Eyes: Negative.   Respiratory: Negative for cough, chest tightness, shortness of breath and wheezing.   Cardiovascular: Negative for chest pain, palpitations and leg swelling.  Gastrointestinal: Negative for nausea, abdominal pain, diarrhea, constipation, blood in stool and abdominal distention.  Musculoskeletal: Positive for arthralgias and gait problem. Negative for myalgias and back pain.  Skin: Negative.   Neurological: Positive for speech difficulty and weakness. Negative for dizziness, syncope, light-headedness and headaches.  Psychiatric/Behavioral: Positive for dysphoric mood. Negative for confusion and decreased concentration.      Objective:   Physical Exam  Constitutional: She appears well-developed and well-nourished. No distress.  HENT:  Head: Normocephalic and atraumatic.  Eyes: EOM are normal.  Neck: Normal range of motion. No JVD present.  Cardiovascular: Normal rate.   Pulmonary/Chest: Effort normal and breath sounds normal. No respiratory distress. She has no wheezes. She has no rales.  Abdominal: Soft. Bowel sounds are normal. She exhibits no distension. There is no tenderness. There is no rebound.    Neurological: She is alert. Coordination abnormal.  Speech is very limited, can follow 2 step commands  Skin: Skin is warm and dry.  Psychiatric: She has a normal mood and affect.   Filed Vitals:   07/28/15 1106  BP: 138/88  Pulse: 76  Temp: 98.4 F (36.9 C)  TempSrc: Oral  Resp: 16  Height: 5\' 7"  (1.702 m)  Weight: 178 lb 12.8 oz (81.103 kg)  SpO2: 94%      Assessment & Plan:

## 2015-07-31 NOTE — Assessment & Plan Note (Signed)
Have quoted them about 10% risk of stroke per year via CHADS2VAS score and about 10 % risk of serious bleeding via HASBLED score. Since she has had serious bleeding and severe stroke in the last year the patient and her daughter were involved in the decision making process. They were very scared of her recent PUD bleed and felt that she almost died and would risk another stroke over another episode of bleeding like that. I would agree that her risk of bleeding is very high and that at this time anticoagulation is not in her best interest. She will continue on ASA for stroke prevention. If circumstances change we can re-evaluate.

## 2015-08-10 ENCOUNTER — Other Ambulatory Visit: Payer: Self-pay | Admitting: Internal Medicine

## 2015-08-11 NOTE — Telephone Encounter (Signed)
Faxed script bck to [facility]...lmb 

## 2015-08-11 NOTE — Telephone Encounter (Signed)
MD out of office this week pls advise on refill.../lmb 

## 2015-08-20 NOTE — Progress Notes (Signed)
I have reviewed notes from her primary medical doctor who thinks that the risk of bleeding is greater than her risk of CVA The decision was made to not anticoagulate

## 2015-08-31 ENCOUNTER — Telehealth: Payer: Self-pay | Admitting: Internal Medicine

## 2015-08-31 NOTE — Telephone Encounter (Signed)
Following up on CMN for patient oxygen.  Faxed twice. Please follow up in regards.  Will fax again.

## 2015-09-03 NOTE — Telephone Encounter (Signed)
This form has been filled out and faxed numerous times. The patient was not under Dr. Frutoso Chaserawford's care during the dates that she wants covered for the O2. Dr. Okey Duprerawford has filled out the forms with the correct dates based on the patient's office visits with her. I have informed the company that is faxing the forms to us and the patient. She needs to get the doctor that was her PCP during the time prior to when she started coming to see Dr. Okey Duprerawford to complete these forms if the dates are not acceptable that Dr. Okey Duprerawford has provided.

## 2015-09-10 ENCOUNTER — Ambulatory Visit (INDEPENDENT_AMBULATORY_CARE_PROVIDER_SITE_OTHER): Payer: Medicare Other | Admitting: Internal Medicine

## 2015-09-10 ENCOUNTER — Encounter: Payer: Self-pay | Admitting: Internal Medicine

## 2015-09-10 VITALS — BP 118/80 | HR 68 | Temp 98.3°F | Resp 18 | Ht 67.0 in | Wt 171.0 lb

## 2015-09-10 DIAGNOSIS — G8194 Hemiplegia, unspecified affecting left nondominant side: Secondary | ICD-10-CM

## 2015-09-10 DIAGNOSIS — I63319 Cerebral infarction due to thrombosis of unspecified middle cerebral artery: Secondary | ICD-10-CM | POA: Diagnosis not present

## 2015-09-10 MED ORDER — ZALEPLON 5 MG PO CAPS: 5.0000 mg | ORAL_CAPSULE | Freq: Every evening | ORAL | Status: DC | PRN

## 2015-09-10 MED ORDER — GABAPENTIN 100 MG PO CAPS: 100.0000 mg | ORAL_CAPSULE | Freq: Every day | ORAL | Status: DC | PRN

## 2015-09-10 MED ORDER — ALBUTEROL SULFATE HFA 108 (90 BASE) MCG/ACT IN AERS: 2.0000 | INHALATION_SPRAY | Freq: Four times a day (QID) | RESPIRATORY_TRACT | Status: DC | PRN

## 2015-09-10 MED ORDER — ALPRAZOLAM 0.5 MG PO TABS: ORAL_TABLET | ORAL | Status: DC

## 2015-09-10 NOTE — Progress Notes (Signed)
   Subjective:    [name], female    DOB: [birth year], 60 y.o.   MRN: [id number]  HPI The patient is a 60 YO female coming in for left leg pain in her thigh and radiating from the knee to the low back. Started about 2 weeks ago. History is very hard to obtain as her language is very limited. Her daughter is with her to help interpret. No injury or overuse that she knows of. No rash or skin changes. She has not tried anything for the pain. Not daily. Happens more in the evening. Lasts less than 1 hour.   Review of Systems  Constitutional: Negative for fever, activity change, appetite change and fatigue.  Respiratory: Negative for cough, chest tightness, shortness of breath and wheezing.   Cardiovascular: Negative for chest pain, palpitations and leg swelling.  Gastrointestinal: Negative for nausea, abdominal pain, diarrhea, constipation, blood in stool and abdominal distention.  Musculoskeletal: Positive for myalgias, arthralgias and gait problem. Negative for back pain.  Skin: Negative.   Neurological: Positive for speech difficulty and weakness. Negative for dizziness, syncope, light-headedness and headaches.      Objective:   Physical Exam  Constitutional: She appears well-developed and well-nourished. No distress.  HENT:  Head: Normocephalic and atraumatic.  Eyes: EOM are normal.  Neck: Normal range of motion. No JVD present.  Cardiovascular: Normal rate.   Pulmonary/Chest: Effort normal and breath sounds normal. No respiratory distress. She has no wheezes. She has no rales.  Abdominal: Soft. Bowel sounds are normal. She exhibits no distension. There is no tenderness. There is no rebound.  Musculoskeletal:  No tenderness in the spine or low back, some pain to palpation over the IT band area left leg  Neurological: She is alert. Coordination abnormal.  Speech is very limited, can follow 2 step commands  Skin: Skin is warm and dry.  Psychiatric: She has a normal mood  and affect.    Filed Vitals:   09/10/15 1312  BP: 118/80  Pulse: 68  Temp: 98.3 F (36.8 C)  TempSrc: Oral  Resp: 18  Height: 5\' 7"  (1.702 m)  Weight: 171 lb (77.565 kg)  SpO2: 94%      Assessment & Plan:

## 2015-09-10 NOTE — Assessment & Plan Note (Signed)
Rx for gabapentin to try for the pain. Also advised that they can try tylenol for the pain or heat or ice.

## 2015-09-10 NOTE — Progress Notes (Signed)
Pre visit review using our clinic review tool, if applicable. No additional management support is needed unless otherwise documented below in the visit note. 

## 2015-09-10 NOTE — Patient Instructions (Signed)
We have sent in the larger albuterol inhaler.   We have sent in sonata for sleep instead and the refill of the xanax.   We have sent in gabapentin for the pain. Another option would be to try tylenol for the pain if needed.

## 2015-09-13 ENCOUNTER — Emergency Department (HOSPITAL_COMMUNITY)
Admission: EM | Admit: 2015-09-13 | Discharge: 2015-09-13 | Disposition: A | Discharge status: Home / Self Care | Payer: Medicare Other | Attending: Emergency Medicine | Admitting: Emergency Medicine

## 2015-09-13 ENCOUNTER — Encounter (HOSPITAL_COMMUNITY): Payer: Self-pay | Admitting: Vascular Surgery

## 2015-09-13 ENCOUNTER — Emergency Department (HOSPITAL_COMMUNITY): Payer: Medicare Other

## 2015-09-13 DIAGNOSIS — F329 Major depressive disorder, single episode, unspecified: Secondary | ICD-10-CM | POA: Insufficient documentation

## 2015-09-13 DIAGNOSIS — E162 Hypoglycemia, unspecified: Secondary | ICD-10-CM | POA: Insufficient documentation

## 2015-09-13 DIAGNOSIS — Z8673 Personal history of transient ischemic attack (TIA), and cerebral infarction without residual deficits: Secondary | ICD-10-CM | POA: Diagnosis not present

## 2015-09-13 DIAGNOSIS — N189 Chronic kidney disease, unspecified: Secondary | ICD-10-CM | POA: Insufficient documentation

## 2015-09-13 DIAGNOSIS — Z7982 Long term (current) use of aspirin: Secondary | ICD-10-CM | POA: Diagnosis not present

## 2015-09-13 DIAGNOSIS — Z992 Dependence on renal dialysis: Secondary | ICD-10-CM | POA: Diagnosis not present

## 2015-09-13 DIAGNOSIS — R0600 Dyspnea, unspecified: Secondary | ICD-10-CM | POA: Diagnosis not present

## 2015-09-13 DIAGNOSIS — Z87891 Personal history of nicotine dependence: Secondary | ICD-10-CM | POA: Insufficient documentation

## 2015-09-13 DIAGNOSIS — Z79899 Other long term (current) drug therapy: Secondary | ICD-10-CM | POA: Insufficient documentation

## 2015-09-13 DIAGNOSIS — J449 Chronic obstructive pulmonary disease, unspecified: Secondary | ICD-10-CM | POA: Insufficient documentation

## 2015-09-13 DIAGNOSIS — M25552 Pain in left hip: Secondary | ICD-10-CM | POA: Diagnosis not present

## 2015-09-13 DIAGNOSIS — I129 Hypertensive chronic kidney disease with stage 1 through stage 4 chronic kidney disease, or unspecified chronic kidney disease: Secondary | ICD-10-CM | POA: Diagnosis not present

## 2015-09-13 DIAGNOSIS — M79606 Pain in leg, unspecified: Secondary | ICD-10-CM

## 2015-09-13 LAB — CBC WITH DIFFERENTIAL/PLATELET
Basophils Absolute: 0 10*3/uL (ref 0.0–0.1)
Basophils Relative: 0 %
Eosinophils Absolute: 0 10*3/uL (ref 0.0–0.7)
Eosinophils Relative: 0 %
HEMATOCRIT: 34.4 % — AB (ref 36.0–46.0)
HEMOGLOBIN: 11 g/dL — AB (ref 12.0–15.0)
LYMPHS ABS: 0.9 10*3/uL (ref 0.7–4.0)
Lymphocytes Relative: 13 %
MCH: 26.9 pg (ref 26.0–34.0)
MCHC: 32 g/dL (ref 30.0–36.0)
MCV: 84.1 fL (ref 78.0–100.0)
MONOS PCT: 13 %
Monocytes Absolute: 0.9 10*3/uL (ref 0.1–1.0)
NEUTROS ABS: 4.8 10*3/uL (ref 1.7–7.7)
NEUTROS PCT: 74 %
Platelets: 187 10*3/uL (ref 150–400)
RBC: 4.09 MIL/uL (ref 3.87–5.11)
RDW: 18.7 % — ABNORMAL HIGH (ref 11.5–15.5)
WBC: 6.6 10*3/uL (ref 4.0–10.5)

## 2015-09-13 LAB — BASIC METABOLIC PANEL
ANION GAP: 19 — AB (ref 5–15)
BUN: 60 mg/dL — ABNORMAL HIGH (ref 6–20)
CHLORIDE: 94 mmol/L — AB (ref 101–111)
CO2: 18 mmol/L — AB (ref 22–32)
Calcium: 8.6 mg/dL — ABNORMAL LOW (ref 8.9–10.3)
Creatinine, Ser: 10.02 mg/dL — ABNORMAL HIGH (ref 0.44–1.00)
GFR calc non Af Amer: 4 mL/min — ABNORMAL LOW (ref >60)
GFR, EST AFRICAN AMERICAN: 4 mL/min — AB (ref >60)
Glucose, Bld: 33 mg/dL — CL (ref 65–99)
Potassium: 4.8 mmol/L (ref 3.5–5.1)
Sodium: 131 mmol/L — ABNORMAL LOW (ref 135–145)

## 2015-09-13 LAB — CBG MONITORING, ED
Glucose-Capillary: 38 mg/dL — CL (ref 65–99)
Glucose-Capillary: 79 mg/dL (ref 65–99)

## 2015-09-13 LAB — SEDIMENTATION RATE: Sed Rate: 20 mm/hr (ref 0–22)

## 2015-09-13 MED ORDER — ACETAMINOPHEN 325 MG PO TABS
650.0000 mg | ORAL_TABLET | Freq: Once | ORAL | Status: AC
  Administered 2015-09-13: 650 mg via ORAL
  Filled 2015-09-13: qty 2

## 2015-09-13 NOTE — ED Notes (Signed)
Patient's daughter reports pt was supposed to have dialysis Saturday but had no transportation to get there.

## 2015-09-13 NOTE — ED Notes (Signed)
Pt reports to the ED for eval of left buttock pain. Pt was seen by her PCP and was dx with nerve pain and was given medication but she refused to take it. Pts daughter denies any acute injury. Pt aphasic at baseline. She is alert. Resp e/u and skin warm and dry.

## 2015-09-13 NOTE — ED Notes (Signed)
Patient given coke and graham crackers with peanut butter

## 2015-09-13 NOTE — ED Notes (Signed)
Attempted to obtain IV access w/o success x 2.

## 2015-09-13 NOTE — Discharge Instructions (Signed)
For the pain, use acetaminophen 650 mg every 4 hours for pain. Also use heat on the sore areas of your legs, 2 or 3 times a day with a heating pad. Go to dialysis tomorrow as scheduled.    Hypoglycemia Hypoglycemia occurs when the glucose in your blood is too low. Glucose is a type of sugar that is your body's main energy source. Hormones, such as insulin and glucagon, control the level of glucose in the blood. Insulin lowers blood glucose and glucagon increases blood glucose. Having too much insulin in your blood stream, or not eating enough food containing sugar, can result in hypoglycemia. Hypoglycemia can happen to people with or without diabetes. It can develop quickly and can be a medical emergency.  CAUSES   Missing or delaying meals.  Not eating enough carbohydrates at meals.  Taking too much diabetes medicine.  Not timing your oral diabetes medicine or insulin doses with meals, snacks, and exercise.  Nausea and vomiting.  Certain medicines.  Severe illnesses, such as hepatitis, kidney disorders, and certain eating disorders.  Increased activity or exercise without eating something extra or adjusting medicines.  Drinking too much alcohol.  A nerve disorder that affects body functions like your heart rate, blood pressure, and digestion (autonomic neuropathy).  A condition where the stomach muscles do not function properly (gastroparesis). Therefore, medicines and food may not absorb properly.  Rarely, a tumor of the pancreas can produce too much insulin. SYMPTOMS   Hunger.  Sweating (diaphoresis).  Change in body temperature.  Shakiness.  Headache.  Anxiety.  Lightheadedness.  Irritability.  Difficulty concentrating.  Dry mouth.  Tingling or numbness in the hands or feet.  Restless sleep or sleep disturbances.  Altered speech and coordination.  Change in mental status.  Seizures or prolonged convulsions.  Combativeness.  Drowsiness  (lethargic).  Weakness.  Increased heart rate or palpitations.  Confusion.  Pale, gray skin color.  Blurred or double vision.  Fainting. DIAGNOSIS  A physical exam and medical history will be performed. Your caregiver may make a diagnosis based on your symptoms. Blood tests and other lab tests may be performed to confirm a diagnosis. Once the diagnosis is made, your caregiver will see if your signs and symptoms go away once your blood glucose is raised.  TREATMENT  Usually, you can easily treat your hypoglycemia when you notice symptoms.  Check your blood glucose. If it is less than 70 mg/dl, take one of the following:   3-4 glucose tablets.    cup juice.    cup regular soda.   1 cup skim milk.   -1 tube of glucose gel.   5-6 hard candies.   Avoid high-fat drinks or food that may delay a rise in blood glucose levels.  Do not take more than the recommended amount of sugary foods, drinks, gel, or tablets. Doing so will cause your blood glucose to go too high.   Wait 10-15 minutes and recheck your blood glucose. If it is still less than 70 mg/dl or below your target range, repeat treatment.   Eat a snack if it is more than 1 hour until your next meal.  There may be a time when your blood glucose may go so low that you are unable to treat yourself at home when you start to notice symptoms. You may need someone to help you. You may even faint or be unable to swallow. If you cannot treat yourself, someone will need to bring you to the hospital.  HOME CARE INSTRUCTIONS  If you have diabetes, follow your diabetes management plan by:  Taking your medicines as directed.  Following your exercise plan.  Following your meal plan. Do not skip meals. Eat on time.  Testing your blood glucose regularly. Check your blood glucose before and after exercise. If you exercise longer or different than usual, be sure to check blood glucose more frequently.  Wearing your  medical alert jewelry that says you have diabetes.  Identify the cause of your hypoglycemia. Then, develop ways to prevent the recurrence of hypoglycemia.  Do not take a hot bath or shower right after an insulin shot.  Always carry treatment with you. Glucose tablets are the easiest to carry.  If you are going to drink alcohol, drink it only with meals.  Tell friends or family members ways to keep you safe during a seizure. This may include removing hard or sharp objects from the area or turning you on your side.  Maintain a healthy weight. SEEK MEDICAL CARE IF:   You are having problems keeping your blood glucose in your target range.  You are having frequent episodes of hypoglycemia.  You feel you might be having side effects from your medicines.  You are not sure why your blood glucose is dropping so low.  You notice a change in vision or a new problem with your vision. SEEK IMMEDIATE MEDICAL CARE IF:   Confusion develops.  A change in mental status occurs.  The inability to swallow develops.  Fainting occurs.   This information is not intended to replace advice given to you by your health care provider. Make sure you discuss any questions you have with your health care provider.   Document Released: 03/20/2005 Document Revised: 03/25/2013 Document Reviewed: 11/24/2014 Elsevier Interactive Patient Education 2016 Elsevier Inc.  [employer] LockerHeat Therapy Heat therapy can help ease sore, stiff, injured, and tight muscles and joints. Heat relaxes your muscles, which may help ease your pain. Heat therapy should only be used on old, pre-existing, or long-lasting (chronic) injuries. Do not use heat therapy unless told by your doctor. HOW TO USE HEAT THERAPY There are several different kinds of heat therapy, including:  Moist heat pack.  Warm water bath.  Hot water bottle.  Electric heating pad.  Heated gel pack.  Heated wrap.  Electric heating pad. GENERAL HEAT THERAPY  RECOMMENDATIONS   Do not sleep while using heat therapy. Only use heat therapy while you are awake.  Your skin may turn pink while using heat therapy. Do not use heat therapy if your skin turns red.  Do not use heat therapy if you have new pain.  High heat or long exposure to heat can cause burns. Be careful when using heat therapy to avoid burning your skin.  Do not use heat therapy on areas of your skin that are already irritated, such as with a rash or sunburn. GET HELP IF:   You have blisters, redness, swelling (puffiness), or numbness.  You have new pain.  Your pain is worse. MAKE SURE YOU:  Understand these instructions.  Will watch your condition.  Will get help right away if you are not doing well or get worse.   This information is not intended to replace advice given to you by your health care provider. Make sure you discuss any questions you have with your health care provider.   Document Released: 06/12/2011 Document Revised: 04/10/2014 Document Reviewed: 05/13/2013 Elsevier Interactive Patient Education [employer]2016 Elsevier Inc.

## 2015-09-13 NOTE — ED Notes (Signed)
Pt transported to xray 

## 2015-09-13 NOTE — ED Provider Notes (Addendum)
CSN: [id number]     Arrival date & time 09/13/15  1146 History  By signing my name below, I, [name], attest that this documentation has been prepared under the direction and in the presence of Mancel Bale, MD. Electronically Signed: Tanda [name], ED Scribe. 09/13/2015. 2:50 PM.   Chief Complaint  Patient presents with  . Leg Pain  . Respiratory Distress   LEVEL 5 CAVEAT - pt with dysarthria  The history is provided by the patient and a relative. No language interpreter was used.   HPI Comments: [name] is a 60 y.o. female with PMHx HTN, COPD, stroke, and CKD on dialysis T Th S, who presents to the Emergency Department complaining of gradual onset, intermittent, left hip pain and diffuse left lateral leg pain x 2 weeks. The pain is exacerbated when laying flat while sleeping. No recent falls or trauma to the leg. Pt has been able to ambulate despite the pain. She typically uses a walker to ambulate or uses furniture to help with keeping her balance. Pt was seen by her PCP, Dr. Hillard Danker, 4 days ago for this pain and was prescribed Gabapentin which she took once last night with no relief. Daughter believes the Gabapentin helped with the pain due to pt sleeping through the night. Daughter mentions that pt missed dialysis 2 days ago due to not having a ride home.She uses oxygen by nasal cannula 24 /7. Denies back pain or any other associated symptoms.    Past Medical History  Diagnosis Date  . Asthma   . Hypertension   . Gout   . Arthritis   . Insomnia   . Depression   . Aortic aneurysm without rupture (HCC)     3cm by CT 08/03/14  . Paroxysmal SVT (supraventricular tachycardia) (HCC)   . Cor pulmonale (HCC)   . PFO (patent foramen ovale)   . COPD (chronic obstructive pulmonary disease) (HCC)   . Shortness of breath dyspnea     on home O2 at 2 L  . Stroke [facility]) 06/2014, 11/2014    still has difficulty getting out some words  . Thyroid disease   . Anxiety   .  Chronic kidney disease     dialysis on T/Th/Sa  . Renal insufficiency    Past Surgical History  Procedure Laterality Date  . Multiple tooth extractions    . Av fistula placement Left 01/28/2014    Procedure: ARTERIOVENOUS (AV) FISTULA CREATION;  Surgeon: Sherren Kerns, MD;  Location: [facility] OR;  Service: Vascular;  Laterality: Left;  . Tee without cardioversion N/A 06/23/2014    Procedure: TRANSESOPHAGEAL ECHOCARDIOGRAM (TEE);  Surgeon: Lewayne Bunting, MD;  Location: [facility] ENDOSCOPY;  Service: Cardiovascular;  Laterality: N/A;  . Insertion of dialysis catheter Right 06/27/2014    Procedure: INSERTION OF Right Internal Jugular DIATEK CATHETER;  Surgeon: Pryor Ochoa, MD;  Location: [facility] OR;  Service: Vascular;  Laterality: Right;  . Laparotomy N/A 07/01/2014    Procedure: EXPLORATORY LAPAROTOMY WITH CLOSURE OF PERFORATED PYLORIC ULCER;  Surgeon: Claud Kelp, MD;  Location: [facility] OR;  Service: General;  Laterality: N/A;  . Bowel resection N/A 07/01/2014    Procedure: SMALL BOWEL RESECTION;  Surgeon: Claud Kelp, MD;  Location: [facility] OR;  Service: General;  Laterality: N/A;  . 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 Resection of arteriovenous fistula aneurysm Left 07/09/2015    Procedure: LEFT UPPER ARM  ARTERIOVENOUS FISTULA  ANEURYSMORRPHY;  Surgeon: Pryor Ochoa, MD;  Location: [facility] OR;  Service: Vascular;  Laterality: Left;   Family History  Problem Relation Age of Onset  . Diabetes Mother   . Hypertension Mother   . Heart disease Mother   . Heart attack Mother   . Peripheral vascular disease Mother   . Diabetes Father   . Heart disease Father   . Hypertension Father   . Diabetes Sister   . Hypertension Sister   . Heart disease Sister     before age 69  . Heart attack Sister   .  Peripheral vascular disease Sister   . Heart disease Brother   . Hyperlipidemia Daughter    Social History  Substance Use Topics  . Smoking status: Former Smoker -- 0.50 packs/day for 20 years    Types: Cigarettes    Quit date: 11/05/2014  . Smokeless tobacco: Never Used  . Alcohol Use: No     Comment: occ   OB History    No data available     Review of Systems  Unable to perform ROS: Other  Pt with dysarthria   Allergies  Heparin  Home Medications   Prior to Admission medications   Medication Sig Start Date End Date Taking? Authorizing Provider  albuterol (PROVENTIL HFA;VENTOLIN HFA) 108 (90 Base) MCG/ACT inhaler Inhale 2 puffs into the lungs every 6 (six) hours as needed for wheezing or shortness of breath. Reported on 07/06/2015 09/10/15  Yes Myrlene Broker, MD  albuterol (PROVENTIL) (2.5 MG/3ML) 0.083% nebulizer solution Take 3 mLs (2.5 mg total) by nebulization every 6 (six) hours as needed for wheezing or shortness of breath. 12/26/14  Yes Ranelle Oyster, MD  ALPRAZolam Prudy Feeler) 0.5 MG tablet Xanax 0.25 mg every 8 hours as needed and 0.5 mg every Tuesday Thursday Saturday with dialysis 09/10/15  Yes Myrlene Broker, MD  aspirin 325 MG tablet Take 1 tablet (325 mg total) by mouth daily. 12/24/14  Yes Daniel J Angiulli, PA-C  calcium acetate (PHOSLO) 667 MG capsule Take 667 mg by mouth 3 (three) times daily with meals.   Yes Historical Provider, MD  gabapentin (NEURONTIN) 100 MG capsule Take 1 capsule (100 mg total) by mouth daily as needed (pain). 09/10/15  Yes Myrlene Broker, MD  ipratropium-albuterol (DUONEB) 0.5-2.5 (3) MG/3ML SOLN Take 3 mLs by nebulization 3 (three) times daily. 12/24/14  Yes Daniel J Angiulli, PA-C  levETIRAcetam (KEPPRA) 100 MG/ML solution Take 5 mLs (500 mg total) by mouth 2 (two) times daily. 12/24/14  Yes Daniel J Angiulli, PA-C  levothyroxine (SYNTHROID, LEVOTHROID) 25 MCG tablet Take 1 tablet (25 mcg total) by mouth daily before breakfast.  12/24/14  Yes Daniel J Angiulli, PA-C  loperamide (IMODIUM A-D) 2 MG tablet Take 2 mg by mouth 4 (four) times daily as needed for diarrhea or loose stools.   Yes Historical Provider, MD  metoprolol succinate (TOPROL-XL) 25 MG 24 hr tablet Take 1 tablet (25 mg total) by mouth daily. 05/25/15  Yes Myrlene Broker, MD  mirtazapine (REMERON) 15 MG tablet Place 1 tablet (15 mg total) into feeding tube at bedtime. Patient taking differently: Take 15 mg by mouth at bedtime.  12/24/14  Yes Daniel J Angiulli, PA-C  multivitamin (RENA-VIT) TABS tablet Take 1 tablet by mouth daily.   Yes Historical Provider, MD  pantoprazole (PROTONIX)  40 MG tablet Take 40 mg by mouth daily. Reported on 05/25/2015   Yes Historical Provider, MD  zaleplon (SONATA) 5 MG capsule Take 1 capsule (5 mg total) by mouth at bedtime as needed for sleep. 09/10/15  Yes Myrlene Broker, MD   BP 150/107 mmHg  Pulse 131  Temp(Src) 98.2 F (36.8 C) (Oral)  Resp 23  SpO2 95%   Physical Exam  Constitutional: She appears well-developed and well-nourished.  HENT:  Head: Normocephalic and atraumatic.  Eyes: Conjunctivae and EOM are normal. Pupils are equal, round, and reactive to light.  Neck: Normal range of motion and phonation normal. Neck supple.  Cardiovascular: Normal rate and regular rhythm.   Normal dialysis fistula left antecubital space.   Pulmonary/Chest: Effort normal. She has no wheezes. She has rhonchi. She has no rales. She exhibits tenderness.  Mild diffuse bilateral anterior chest wall tenderness. Scattered rhonchi.  Abdominal: Soft. She exhibits no distension. There is no tenderness. There is no guarding.  Musculoskeletal: She exhibits edema and tenderness.  Bilateral lateral thigh tenderness between hip and knee. Increased pain same site with passive flexion of the knees. On the thighs - no overlying erythema or fluctuance or mass. Able to straight leg raise bilaterally without discomfort. No tenderness of the  hips with passive ROM. 1+ edema lower legs bilaterally.   Neurological: She exhibits normal muscle tone.  Dysarthria.   Skin: Skin is warm and dry.  Psychiatric: She has a normal mood and affect. Her behavior is normal. Judgment and thought content normal.  Nursing note and vitals reviewed.   ED Course  Procedures (including critical care time)  DIAGNOSTIC STUDIES: Oxygen Saturation is 100% on [place], normal by my interpretation.    Medications  acetaminophen (TYLENOL) tablet 650 mg (650 mg Oral Given 09/13/15 1614)    Patient Vitals for the past 24 hrs:  BP Temp Temp src Pulse Resp SpO2  09/13/15 1645 (!) 150/107 mmHg - - (!) 131 23 95 %  09/13/15 1616 (!) 157/121 mmHg - - - - -  09/13/15 1500 133/94 mmHg - - - (!) 27 100 %  09/13/15 1413 - - - - 21 -  09/13/15 1357 141/94 mmHg - - - - -  09/13/15 1345 - - - 91 - 100 %  09/13/15 1335 - - - 93 - (!) 71 %  09/13/15 1256 143/97 mmHg 98.2 F (36.8 C) Oral 105 20 100 %     COORDINATION OF CARE: 2:45 PM-Discussed treatment plan which includes CXR with pt at bedside and pt agreed to plan.    4:05 PM Reevaluation with update and discussion. After initial assessment and treatment, an updated evaluation reveals she is more comfortable spontaneously. She requests something for pain agreed to take Tylenol. Patient family members updated on findings, and understands that the patient will be likely discharged. Her labs do not indicate a reason for her to stay.Mancel Bale L   17:00- incidental hypoglycemia noted on labs, treated with oral nutrition. Is likely related to her being in emergency department for several hours, awaiting testing results. Doubt ongoing need for nutritional support in the hospitalized setting. Patient's mental status has not been affected. She remains alert and cooperative in his eating well, now.   Labs Review Labs Reviewed  BASIC METABOLIC PANEL - Abnormal; Notable for the following:    Sodium 131 (*)     Chloride 94 (*)    CO2 18 (*)    Glucose, Bld 33 (*)  BUN 60 (*)    Creatinine, Ser 10.02 (*)    Calcium 8.6 (*)    GFR calc non Af Amer 4 (*)    GFR calc Af Amer 4 (*)    Anion gap 19 (*)    All other components within normal limits  CBC WITH DIFFERENTIAL/PLATELET - Abnormal; Notable for the following:    Hemoglobin 11.0 (*)    HCT 34.4 (*)    RDW 18.7 (*)    All other components within normal limits  CBG MONITORING, ED - Abnormal; Notable for the following:    Glucose-Capillary 38 (*)    All other components within normal limits  SEDIMENTATION RATE    Imaging Review Dg Chest 2 View  09/13/2015  CLINICAL DATA:  LEFT buttock pain, COPD, hypertension, stroke, former smoker EXAM: CHEST  2 VIEW COMPARISON:  05/23/2015 FINDINGS: Enlargement of cardiac silhouette. Atherosclerotic calcification and mild elongation of thoracic aorta. Prominent central pulmonary arteries raising question of pulmonary arterial hypertension. Lungs clear. No acute infiltrate, pleural effusion or pneumothorax. Bones demineralized. IMPRESSION: Enlargement of cardiac silhouette with enlarged central pulmonary arteries raising question of pulmonary arterial hypertension. No acute infiltrate. Electronically Signed   By: Ulyses SouthwardMark  Boles M.D.   On: 09/13/2015 15:23   I have personally reviewed and evaluated these images and lab results as part of my medical decision-making.   EKG Interpretation   Date/Time:  Monday Zakayla 12 2017 13:55:19 EDT Ventricular Rate:  94 PR Interval:  154 QRS Duration: 83 QT Interval:  341 QTC Calculation: 426 R Axis:   125 Text Interpretation:  Sinus rhythm Left atrial enlargement Anteroseptal  infarct, age indeterminate since last tracing no significant change  Confirmed by Effie ShyWENTZ  MD, Coyt Govoni [phone]([id number]) on 09/13/2015 2:04:15 PM      MDM   Final diagnoses:  Pain of lower extremity, unspecified laterality  Hypoglycemia    Nonspecific leg pain, in a dialysis patient. No known trauma.  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 Patient evaluated for infectious and metabolic processes, with blood work. Chest x-ray is reassuring, for a normal volume status. Screening blood work done, reveals hypoglycemia, as well as expected changes consistent with chronic renal failure (elevation, BUN and creatinine). Potassium is normal. She is due for dialysis tomorrow. Patient able to eat. In emergency department.  Nursing Notes Reviewed/ Care Coordinated, and agree without changes. Applicable Imaging Reviewed.  Interpretation of Laboratory Data incorporated into ED treatment   Plan- disposition per Dr. Madilyn Hookees, after a period of observation. Likely discharge home with over-the-counter analgesic medications. With follow-up, by PCP recommended for next week.  I personally performed the services described in this documentation, which was scribed in my presence. The recorded information has been reviewed and is accurate.       Mancel BaleElliott Remigio Mcmillon, MD 09/14/15 [phone]

## 2015-09-13 NOTE — ED Notes (Signed)
ON arrival to room Pt's o2  sats 60% on room air . Pt did not bring home O2  For transport to hospital in family car. Pt placed on nasal O2  With O2 sats to 99%.

## 2015-09-13 NOTE — ED Notes (Signed)
Pt given sandwich and Sprite after CBG 38

## 2015-09-13 NOTE — ED Notes (Signed)
Dr. Effie ShyWentz made aware of CBG of 338, MD advised this RN to feed patient and recheck CBG after.

## 2015-09-13 NOTE — ED Notes (Signed)
POCT CBG resulted 79; Chelsea, RN aware

## 2015-09-13 NOTE — ED Notes (Signed)
MD at bedside. 

## 2015-09-13 NOTE — ED Notes (Signed)
Phleb will draw labs.

## 2015-09-13 NOTE — ED Notes (Addendum)
Patient brought back from sub waiting c/o breathing problems.  Checked SpO2 and found to be 60%.  Placed pt. On 2L O2.

## 2015-09-16 ENCOUNTER — Encounter (HOSPITAL_COMMUNITY): Payer: Self-pay | Admitting: Emergency Medicine

## 2015-09-16 ENCOUNTER — Emergency Department (HOSPITAL_COMMUNITY)
Admission: EM | Admit: 2015-09-16 | Discharge: 2015-09-17 | Disposition: A | Discharge status: Home / Self Care | Payer: Medicare Other | Source: Home / Self Care | Attending: Emergency Medicine | Admitting: Emergency Medicine

## 2015-09-16 ENCOUNTER — Telehealth: Payer: Self-pay | Admitting: Internal Medicine

## 2015-09-16 DIAGNOSIS — Z79899 Other long term (current) drug therapy: Secondary | ICD-10-CM

## 2015-09-16 DIAGNOSIS — Z8673 Personal history of transient ischemic attack (TIA), and cerebral infarction without residual deficits: Secondary | ICD-10-CM | POA: Insufficient documentation

## 2015-09-16 DIAGNOSIS — N189 Chronic kidney disease, unspecified: Secondary | ICD-10-CM

## 2015-09-16 DIAGNOSIS — Z7982 Long term (current) use of aspirin: Secondary | ICD-10-CM | POA: Insufficient documentation

## 2015-09-16 DIAGNOSIS — M79651 Pain in right thigh: Secondary | ICD-10-CM

## 2015-09-16 DIAGNOSIS — J449 Chronic obstructive pulmonary disease, unspecified: Secondary | ICD-10-CM

## 2015-09-16 DIAGNOSIS — I132 Hypertensive heart and chronic kidney disease with heart failure and with stage 5 chronic kidney disease, or end stage renal disease: Secondary | ICD-10-CM | POA: Diagnosis not present

## 2015-09-16 DIAGNOSIS — Z87891 Personal history of nicotine dependence: Secondary | ICD-10-CM | POA: Insufficient documentation

## 2015-09-16 DIAGNOSIS — I129 Hypertensive chronic kidney disease with stage 1 through stage 4 chronic kidney disease, or unspecified chronic kidney disease: Secondary | ICD-10-CM

## 2015-09-16 DIAGNOSIS — E162 Hypoglycemia, unspecified: Secondary | ICD-10-CM | POA: Insufficient documentation

## 2015-09-16 DIAGNOSIS — M79606 Pain in leg, unspecified: Secondary | ICD-10-CM

## 2015-09-16 DIAGNOSIS — I5033 Acute on chronic diastolic (congestive) heart failure: Secondary | ICD-10-CM | POA: Diagnosis not present

## 2015-09-16 DIAGNOSIS — M79652 Pain in left thigh: Secondary | ICD-10-CM

## 2015-09-16 LAB — COMPREHENSIVE METABOLIC PANEL
ALBUMIN: 3.1 g/dL — AB (ref 3.5–5.0)
ALT: 25 U/L (ref 14–54)
ANION GAP: 14 (ref 5–15)
AST: 47 U/L — AB (ref 15–41)
Alkaline Phosphatase: 90 U/L (ref 38–126)
BILIRUBIN TOTAL: 1.3 mg/dL — AB (ref 0.3–1.2)
BUN: 20 mg/dL (ref 6–20)
CHLORIDE: 95 mmol/L — AB (ref 101–111)
CO2: 24 mmol/L (ref 22–32)
Calcium: 9.2 mg/dL (ref 8.9–10.3)
Creatinine, Ser: 5.29 mg/dL — ABNORMAL HIGH (ref 0.44–1.00)
GFR calc Af Amer: 9 mL/min — ABNORMAL LOW (ref >60)
GFR calc non Af Amer: 8 mL/min — ABNORMAL LOW (ref >60)
GLUCOSE: 55 mg/dL — AB (ref 65–99)
POTASSIUM: 4.4 mmol/L (ref 3.5–5.1)
Sodium: 133 mmol/L — ABNORMAL LOW (ref 135–145)
TOTAL PROTEIN: 8.3 g/dL — AB (ref 6.5–8.1)

## 2015-09-16 LAB — CBG MONITORING, ED
GLUCOSE-CAPILLARY: 45 mg/dL — AB (ref 65–99)
GLUCOSE-CAPILLARY: 80 mg/dL (ref 65–99)
Glucose-Capillary: 97 mg/dL (ref 65–99)

## 2015-09-16 LAB — CBC WITH DIFFERENTIAL/PLATELET
BASOS ABS: 0 10*3/uL (ref 0.0–0.1)
BASOS PCT: 0 %
EOS ABS: 0 10*3/uL (ref 0.0–0.7)
EOS PCT: 0 %
HCT: 36.8 % (ref 36.0–46.0)
Hemoglobin: 11.9 g/dL — ABNORMAL LOW (ref 12.0–15.0)
Lymphocytes Relative: 10 %
Lymphs Abs: 1.1 10*3/uL (ref 0.7–4.0)
MCH: 27.4 pg (ref 26.0–34.0)
MCHC: 32.3 g/dL (ref 30.0–36.0)
MCV: 84.8 fL (ref 78.0–100.0)
Monocytes Absolute: 1.5 10*3/uL — ABNORMAL HIGH (ref 0.1–1.0)
Monocytes Relative: 15 %
Neutro Abs: 7.9 10*3/uL — ABNORMAL HIGH (ref 1.7–7.7)
Neutrophils Relative %: 75 %
PLATELETS: 214 10*3/uL (ref 150–400)
RBC: 4.34 MIL/uL (ref 3.87–5.11)
RDW: 18.8 % — AB (ref 11.5–15.5)
WBC: 10.5 10*3/uL (ref 4.0–10.5)

## 2015-09-16 MED ORDER — FENTANYL CITRATE (PF) 100 MCG/2ML IJ SOLN
50.0000 ug | Freq: Once | INTRAMUSCULAR | Status: AC
  Administered 2015-09-16: 50 ug via INTRAVENOUS
  Filled 2015-09-16: qty 2

## 2015-09-16 MED ORDER — DEXTROSE 50 % IV SOLN
INTRAVENOUS | Status: AC
  Filled 2015-09-16: qty 50

## 2015-09-16 MED ORDER — GLUCOSE 40 % PO GEL
1.0000 | Freq: Once | ORAL | Status: DC
  Filled 2015-09-16: qty 1

## 2015-09-16 MED ORDER — DEXTROSE 50 % IV SOLN
1.0000 | Freq: Once | INTRAVENOUS | Status: AC
  Administered 2015-09-16: 50 mL via INTRAVENOUS

## 2015-09-16 NOTE — ED Notes (Signed)
Pt c/o bil leg pain.  Bil thighs are very firm to touch,  Very painful to stand or walk

## 2015-09-16 NOTE — ED Notes (Signed)
PA at bedside at this time.  

## 2015-09-16 NOTE — Telephone Encounter (Signed)
Pt called daughter called in and has some questions about pt swelling and meds she can take for it Best number [phone]

## 2015-09-16 NOTE — ED Notes (Signed)
CBG 129 

## 2015-09-16 NOTE — Telephone Encounter (Signed)
Left message for daughter to call back.  

## 2015-09-17 LAB — CBG MONITORING, ED
GLUCOSE-CAPILLARY: 129 mg/dL — AB (ref 65–99)
GLUCOSE-CAPILLARY: 82 mg/dL (ref 65–99)

## 2015-09-17 MED ORDER — HYDROCODONE-ACETAMINOPHEN 5-325 MG PO TABS: 1.0000 | ORAL_TABLET | Freq: Four times a day (QID) | ORAL | Status: DC | PRN

## 2015-09-17 MED ORDER — FENTANYL CITRATE (PF) 100 MCG/2ML IJ SOLN
50.0000 ug | Freq: Once | INTRAMUSCULAR | Status: AC
  Administered 2015-09-17: 50 ug via INTRAVENOUS
  Filled 2015-09-17: qty 2

## 2015-09-17 NOTE — ED Provider Notes (Signed)
CSN: [id number]     Arrival date & time 09/16/15  1732 History   First MD Initiated Contact with Patient 09/16/15 2312     Chief Complaint  Patient presents with  . Leg Pain     (Consider location/radiation/quality/duration/timing/severity/associated sxs/prior Treatment) HPI   [name] with a history of HTN, strokes, COPD, and CKD on dialysis T  Th  S - who presents to the Emergency Department complaining of ongoing bilateral hip and lateral leg pain. She states this pain is constant and rates it as a 10/10. The pain improves with elevating her legs and worsens with walking. She denies numbness or tingling in the legs. She denies recent falls or trauma to the legs.   Patient was seen by her PCP, Dr. Hillard DankerElizabeth [name], 09/10/15 for this problem and given Gabapentin for pain. She was then seen in the ED 09/13/15 for this same problem and discharged home with instruction to continue Gabapentin and use Tylenol as needed for pain. Patient states that pain has not worsened since she was seen 09/13/15, but has not improved. She received dialysis today.    Past Medical History  Diagnosis Date  . Asthma   . Hypertension   . Gout   . Arthritis   . Insomnia   . Depression   . Aortic aneurysm without rupture (HCC)     3cm by CT 08/03/14  . Paroxysmal SVT (supraventricular tachycardia) (HCC)   . Cor pulmonale (HCC)   . PFO (patent foramen ovale)   . COPD (chronic obstructive pulmonary disease) (HCC)   . Shortness of breath dyspnea     on home O2 at 2 L  . Stroke [facility](HCC) 06/2014, 11/2014    still has difficulty getting out some words  . Thyroid disease   . Anxiety   . Chronic kidney disease     dialysis on T/Th/Sa  . Renal insufficiency    Past Surgical History  Procedure Laterality Date  . Multiple tooth extractions    . Av fistula placement Left 01/28/2014    Procedure: ARTERIOVENOUS (AV) FISTULA CREATION;  Surgeon: Sherren Kernsharles E  Fields, MD;  [facility];  Service: Vascular;  Laterality: Left;  . Tee without cardioversion N/A 06/23/2014    Procedure: TRANSESOPHAGEAL ECHOCARDIOGRAM (TEE);  Surgeon: Lewayne BuntingBrian S Crenshaw, MD;  [facility];  Service: Cardiovascular;  Laterality: N/A;  . Insertion of dialysis catheter Right 06/27/2014    Procedure: INSERTION OF Right Internal Jugular DIATEK CATHETER;  Surgeon: Pryor OchoaJames D Lawson, MD;  [facility];  Service: Vascular;  Laterality: Right;  . Laparotomy N/A 07/01/2014    Procedure: EXPLORATORY LAPAROTOMY WITH CLOSURE OF PERFORATED PYLORIC ULCER;  Surgeon: Claud KelpHaywood Ingram, MD;  Location: [facility] OR;  Service: General;  Laterality: N/A;  . Bowel resection N/A 07/01/2014    Procedure: SMALL BOWEL RESECTION;  Surgeon: Claud KelpHaywood Ingram, MD;  [facility];  Service: General;  Laterality: N/A;  . 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 Resection of arteriovenous fistula aneurysm Left 07/09/2015    Procedure: LEFT UPPER ARM  ARTERIOVENOUS FISTULA  ANEURYSMORRPHY;  Surgeon: Pryor OchoaJames D Lawson, MD;  [facility];  Service: Vascular;  Laterality: Left;   Family History  Problem Relation Age of Onset  .  Diabetes Mother   . Hypertension Mother   . Heart disease Mother   . Heart attack Mother   . Peripheral vascular disease Mother   . Diabetes Father   . Heart disease Father   . Hypertension Father   . Diabetes Sister   . Hypertension Sister   . Heart disease Sister     before age 44  . Heart attack Sister   . Peripheral vascular disease Sister   . Heart disease Brother   . Hyperlipidemia Daughter    Social History  Substance Use Topics  . Smoking status: Former Smoker -- 0.50 packs/day for 20 years    Types: Cigarettes    Quit date: 11/05/2014  . Smokeless tobacco:  Never Used  . Alcohol Use: No     Comment: occ   OB History    No data available     Review of Systems  Review of Systems All other systems negative except as documented in the HPI. All pertinent positives and negatives as reviewed in the HPI.   Allergies  Heparin  Home Medications   Prior to Admission medications   Medication Sig Start Date End Date Taking? Authorizing Provider  albuterol (PROVENTIL HFA;VENTOLIN HFA) 108 (90 Base) MCG/ACT inhaler Inhale 2 puffs into the lungs every 6 (six) hours as needed for wheezing or shortness of breath. Reported on 07/06/2015 09/10/15  Yes Myrlene Broker, MD  albuterol (PROVENTIL) (2.5 MG/3ML) 0.083% nebulizer solution Take 3 mLs (2.5 mg total) by nebulization every 6 (six) hours as needed for wheezing or shortness of breath. 12/26/14  Yes Ranelle Oyster, MD  ALPRAZolam Prudy Feeler) 0.5 MG tablet Xanax 0.25 mg every 8 hours as needed and 0.5 mg every Tuesday Thursday Saturday with dialysis 09/10/15  Yes Myrlene Broker, MD  aspirin 325 MG tablet Take 1 tablet (325 mg total) by mouth daily. 12/24/14  Yes Daniel J Angiulli, PA-C  calcium acetate (PHOSLO) 667 MG capsule Take 667 mg by mouth 3 (three) times daily with meals.   Yes Historical Provider, MD  gabapentin (NEURONTIN) 100 MG capsule Take 1 capsule (100 mg total) by mouth daily as needed (pain). 09/10/15  Yes Myrlene Broker, MD  ipratropium-albuterol (DUONEB) 0.5-2.5 (3) MG/3ML SOLN Take 3 mLs by nebulization 3 (three) times daily. 12/24/14  Yes Daniel J Angiulli, PA-C  levETIRAcetam (KEPPRA) 100 MG/ML solution Take 5 mLs (500 mg total) by mouth 2 (two) times daily. 12/24/14  Yes Daniel J Angiulli, PA-C  levothyroxine (SYNTHROID, LEVOTHROID) 25 MCG tablet Take 1 tablet (25 mcg total) by mouth daily before breakfast. 12/24/14  Yes Daniel J Angiulli, PA-C  loperamide (IMODIUM A-D) 2 MG tablet Take 2 mg by mouth 4 (four) times daily as needed for diarrhea or loose stools.   Yes Historical  Provider, MD  metoprolol succinate (TOPROL-XL) 25 MG 24 hr tablet Take 1 tablet (25 mg total) by mouth daily. 05/25/15  Yes Myrlene Broker, MD  mirtazapine (REMERON) 15 MG tablet Place 1 tablet (15 mg total) into feeding tube at bedtime. Patient taking differently: Take 15 mg by mouth at bedtime.  12/24/14  Yes Daniel J Angiulli, PA-C  multivitamin (RENA-VIT) TABS tablet Take 1 tablet by mouth daily.   Yes Historical Provider, MD  pantoprazole (PROTONIX) 40 MG tablet Take 40 mg by mouth daily. Reported on 05/25/2015   Yes Historical Provider, MD  zaleplon (SONATA) 5 MG capsule Take 1 capsule (5 mg total) by mouth at bedtime as needed for sleep. 09/10/15  Yes Myrlene Broker, MD  HYDROcodone-acetaminophen (NORCO/VICODIN) 5-325 MG tablet Take 1 tablet by mouth every 6 (six) hours as needed. 09/17/15   Evora Schechter Neva Seat, PA-C   BP 118/80 mmHg  Pulse 92  Temp(Src) 98.2 F (36.8 C) (Oral)  SpO2 92% Physical Exam  Constitutional: She appears well-developed and well-nourished. No distress.  HENT:  Head: Normocephalic and atraumatic.  Eyes: Pupils are equal, round, and reactive to light.  Neck: Normal range of motion. Neck supple.  Cardiovascular: Normal rate and regular rhythm.   Normal dialysis fistula  Pulmonary/Chest: Effort normal.  Abdominal: Soft.  Musculoskeletal:  Tenderness to bilateral thighs, pt has FROM and there are no signs of infection or injury. Able to raise legs without significant discomfort.  Neurological: She is alert.  Skin: Skin is warm and dry.  Nursing note and vitals reviewed.   ED Course  Procedures (including critical care time) Labs Review Labs Reviewed  CBC WITH DIFFERENTIAL/PLATELET - Abnormal; Notable for the following:    Hemoglobin 11.9 (*)    RDW 18.8 (*)    Neutro Abs 7.9 (*)    Monocytes Absolute 1.5 (*)    All other components within normal limits  COMPREHENSIVE METABOLIC PANEL - Abnormal; Notable for the following:    Sodium 133 (*)     Chloride 95 (*)    Glucose, Bld 55 (*)    Creatinine, Ser 5.29 (*)    Total Protein 8.3 (*)    Albumin 3.1 (*)    AST 47 (*)    Total Bilirubin 1.3 (*)    GFR calc non Af Amer 8 (*)    GFR calc Af Amer 9 (*)    All other components within normal limits  CBG MONITORING, ED - Abnormal; Notable for the following:    Glucose-Capillary 45 (*)    All other components within normal limits  CBG MONITORING, ED - Abnormal; Notable for the following:    Glucose-Capillary 129 (*)    All other components within normal limits  CBG MONITORING, ED  CBG MONITORING, ED  CBG MONITORING, ED    Imaging Review No results found. I have personally reviewed and evaluated these images and lab results as part of my medical decision-making.   EKG Interpretation None      MDM   Final diagnoses:  Hypoglycemia  Pain of lower extremity, unspecified laterality   Patient seen by Dr. Bebe Shaggy as well. She has not eaten all day and became hypoglycemic again. She is not diabetic. Pt eats well at home per family members. She is also walking. Dr. Bebe Shaggy recommends giving patient some Vicodin for home for pain until she can follow-up with PCP.  She does not have any blood work abnormalities that are acutely abnormal. She does not have sign of infection, very low suspicion for DVT. Pt overall doing well otherwise, CBG at discharge is 82, given food at discharge.  Medications  dextrose 50 % solution 50 mL (50 mLs Intravenous Given 09/16/15 2149)  fentaNYL (SUBLIMAZE) injection 50 mcg (50 mcg Intravenous Given 09/16/15 2350)  fentaNYL (SUBLIMAZE) injection 50 mcg (50 mcg Intravenous Given 09/17/15 0100)    I discussed results, diagnoses and plan with [name]. They voice there understanding and questions were answered. We discussed follow-up recommendations and return precautions.     Marlon Pel, PA-C 09/17/15 0150  Zadie Rhine, MD 09/17/15 [phone]

## 2015-09-17 NOTE — Discharge Instructions (Signed)
Neuropathic Pain Neuropathic pain is pain caused by damage to the nerves that are responsible for certain sensations in your body (sensory nerves). The pain can be caused by damage to:   The sensory nerves that send signals to your spinal cord and brain (peripheral nervous system).  The sensory nerves in your brain or spinal cord (central nervous system). Neuropathic pain can make you more sensitive to pain. What would be a minor sensation for most people may feel very painful if you have neuropathic pain. This is usually a long-term condition that can be difficult to treat. The type of pain can differ from person to person. It may start suddenly (acute), or it may develop slowly and last for a long time (chronic). Neuropathic pain may come and go as damaged nerves heal or may stay at the same level for years. It often causes emotional distress, loss of sleep, and a lower quality of life. CAUSES  The most common cause of damage to a sensory nerve is diabetes. Many other diseases and conditions can also cause neuropathic pain. Causes of neuropathic pain can be classified as:  Toxic. Many drugs and chemicals can cause toxic damage. The most common cause of toxic neuropathic pain is damage from drug treatment for cancer (chemotherapy).  Metabolic. This type of pain can happen when a disease causes imbalances that damage nerves. Diabetes is the most common of these diseases. Vitamin B deficiency caused by long-term alcohol abuse is another common cause.  Traumatic. Any injury that cuts, crushes, or stretches a nerve can cause damage and pain. A common example is feeling pain after losing an arm or leg (phantom limb pain).  Compression-related. If a sensory nerve gets trapped or compressed for a long period of time, the blood supply to the nerve can be cut off.  Vascular. Many blood vessel diseases can cause neuropathic pain by decreasing blood supply and oxygen to nerves.  Autoimmune. This type of  pain results from diseases in which the body's defense system mistakenly attacks sensory nerves. Examples of autoimmune diseases that can cause neuropathic pain include lupus and multiple sclerosis.  Infectious. Many types of viral infections can damage sensory nerves and cause pain. Shingles infection is a common cause of this type of pain.  Inherited. Neuropathic pain can be a symptom of many diseases that are passed down through families (genetic). SIGNS AND SYMPTOMS  The main symptom is pain. Neuropathic pain is often described as:  Burning.  Shock-like.  Stinging.  Hot or cold.  Itching. DIAGNOSIS  No single test can diagnose neuropathic pain. Your health care provider will do a physical exam and ask you about your pain. You may use a pain scale to describe how bad your pain is. You may also have tests to see if you have a high sensitivity to pain and to help find the cause and location of any sensory nerve damage. These tests may include:  Imaging studies, such as:  X-rays.  CT scan.  MRI.  Nerve conduction studies to test how well nerve signals travel through your sensory nerves (electrodiagnostic testing).  Stimulating your sensory nerves through electrodes on your skin and measuring the response in your spinal cord and brain (somatosensory evoked potentials). TREATMENT  Treatment for neuropathic pain may change over time. You may need to try different treatment options or a combination of treatments. Some options include:  Over-the-counter pain relievers.  Prescription medicines. Some medicines used to treat other conditions may also help neuropathic pain. These  include medicines to:  Control seizures (anticonvulsants).  Relieve depression (antidepressants).  Prescription-strength pain relievers (narcotics). These are usually used when other pain relievers do not help.  Transcutaneous nerve stimulation (TENS). This uses electrical currents to block painful nerve  signals. The treatment is painless.  Topical and local anesthetics. These are medicines that numb the nerves. They can be injected as a nerve block or applied to the skin.  Alternative treatments, such as:  Acupuncture.  Meditation.  Massage.  Physical therapy.  Pain management programs.  Counseling. HOME CARE INSTRUCTIONS  Learn as much as you can about your condition.  Take medicines only as directed by your health care provider.  Work closely with all your health care providers to find what works best for you.  Have a good support system at home.  Consider joining a chronic pain support group. SEEK MEDICAL CARE IF:  Your pain treatments are not helping.  You are having side effects from your medicines.  You are struggling with fatigue, mood changes, depression, or anxiety.   This information is not intended to replace advice given to you by your health care provider. Make sure you discuss any questions you have with your health care provider.   Document Released: 12/16/2003 Document Revised: 04/10/2014 Document Reviewed: 08/28/2013 Elsevier Interactive Patient Education 2016 [employer]  Hypoglycemia Low blood sugar (hypoglycemia) means that the level of sugar in your blood is lower than it should be. Signs of low blood sugar include:  Getting sweaty.  Feeling hungry.  Feeling dizzy or weak.  Feeling sleepier than normal.  Feeling nervous.  Headaches.  Having a fast heartbeat. Low blood sugar can happen fast and can be an emergency. Your doctor can do tests to check your blood sugar level. You can have low blood sugar and not have diabetes. HOME CARE  Check your blood sugar as told by your doctor. If it is less than 70 mg/dl or as told by your doctor, take 1 of the following:  3 to 4 glucose tablets.   cup clear juice.   cup soda pop, not diet.  1 cup milk.  5 to 6 hard candies.  Recheck blood sugar after 15 minutes. Repeat until it is  at the right level.  Eat a snack if it is more than 1 hour until the next meal.  Only take medicine as told by your doctor.  Do not skip meals. Eat on time.  Do not drink alcohol except with meals.  Check your blood glucose before driving.  Check your blood glucose before and after exercise.  Always carry treatment with you, such as glucose pills.  Always wear a medical alert bracelet if you have diabetes. GET HELP RIGHT AWAY IF:   Your blood glucose goes below 70 mg/dl or as told by your doctor, and you:  Are confused.  Are not able to swallow.  Pass out (faint).  You cannot treat yourself. You may need someone to help you.  You have low blood sugar problems often.  You have problems from your medicines.  You are not feeling better after 3 to 4 days.  You have vision changes. MAKE SURE YOU:   Understand these instructions.  Will watch this condition.  Will get help right away if you are not doing well or get worse.   This information is not intended to replace advice given to you by your health care provider. Make sure you discuss any questions you have with your health care provider.  Document Released: 06/14/2009 Document Revised: 04/10/2014 Document Reviewed: 11/24/2014 Elsevier Interactive Patient Education [employer]

## 2015-09-17 NOTE — ED Notes (Signed)
CBG 82 

## 2015-09-17 NOTE — ED Provider Notes (Signed)
Patient seen/examined in the Emergency Department in conjunction with Midlevel Provider Neva SeatGreene Patient reports bilateral thigh pain.  She also had hypoglycemia but has not ate much today and did have dialysis today Exam : awake/alert, no distress, mild bilateral thigh tenderness but not bruising/deformity, no edema Plan: no signs of DVT or occult deformity Plan for homegoing narcotics and close PCP followup   Zadie Rhineonald Ayden Hardwick, MD 09/17/15 [phone]

## 2015-09-17 NOTE — ED Notes (Signed)
Patient declined offer of [place] sandwich meal reporting that she prefers to eat at home.

## 2015-09-18 ENCOUNTER — Emergency Department (HOSPITAL_COMMUNITY): Payer: Medicare Other

## 2015-09-18 ENCOUNTER — Encounter (HOSPITAL_COMMUNITY): Payer: Self-pay

## 2015-09-18 ENCOUNTER — Inpatient Hospital Stay (HOSPITAL_COMMUNITY)
Admission: EM | Admit: 2015-09-18 | Discharge: 2015-10-02 | DRG: 291 | Disposition: E | Payer: Medicare Other | Attending: Internal Medicine | Admitting: Internal Medicine

## 2015-09-18 ENCOUNTER — Other Ambulatory Visit: Payer: Self-pay

## 2015-09-18 ENCOUNTER — Inpatient Hospital Stay (HOSPITAL_COMMUNITY): Payer: Medicare Other

## 2015-09-18 ENCOUNTER — Emergency Department (HOSPITAL_COMMUNITY)
Admit: 2015-09-18 | Discharge: 2015-09-18 | Disposition: A | Discharge status: Home / Self Care | Payer: Medicare Other | Attending: Emergency Medicine | Admitting: Emergency Medicine

## 2015-09-18 DIAGNOSIS — Z992 Dependence on renal dialysis: Secondary | ICD-10-CM

## 2015-09-18 DIAGNOSIS — Z888 Allergy status to other drugs, medicaments and biological substances status: Secondary | ICD-10-CM

## 2015-09-18 DIAGNOSIS — R0602 Shortness of breath: Secondary | ICD-10-CM | POA: Diagnosis not present

## 2015-09-18 DIAGNOSIS — Z7189 Other specified counseling: Secondary | ICD-10-CM | POA: Diagnosis not present

## 2015-09-18 DIAGNOSIS — I5032 Chronic diastolic (congestive) heart failure: Secondary | ICD-10-CM | POA: Diagnosis present

## 2015-09-18 DIAGNOSIS — I69320 Aphasia following cerebral infarction: Secondary | ICD-10-CM

## 2015-09-18 DIAGNOSIS — IMO0002 Reserved for concepts with insufficient information to code with codable children: Secondary | ICD-10-CM

## 2015-09-18 DIAGNOSIS — I272 Other secondary pulmonary hypertension: Secondary | ICD-10-CM | POA: Diagnosis present

## 2015-09-18 DIAGNOSIS — D631 Anemia in chronic kidney disease: Secondary | ICD-10-CM | POA: Diagnosis present

## 2015-09-18 DIAGNOSIS — I5033 Acute on chronic diastolic (congestive) heart failure: Secondary | ICD-10-CM | POA: Diagnosis present

## 2015-09-18 DIAGNOSIS — I639 Cerebral infarction, unspecified: Secondary | ICD-10-CM | POA: Diagnosis present

## 2015-09-18 DIAGNOSIS — E785 Hyperlipidemia, unspecified: Secondary | ICD-10-CM | POA: Diagnosis present

## 2015-09-18 DIAGNOSIS — R778 Other specified abnormalities of plasma proteins: Secondary | ICD-10-CM | POA: Diagnosis present

## 2015-09-18 DIAGNOSIS — J810 Acute pulmonary edema: Secondary | ICD-10-CM | POA: Insufficient documentation

## 2015-09-18 DIAGNOSIS — Z87891 Personal history of nicotine dependence: Secondary | ICD-10-CM | POA: Diagnosis not present

## 2015-09-18 DIAGNOSIS — R609 Edema, unspecified: Secondary | ICD-10-CM | POA: Diagnosis not present

## 2015-09-18 DIAGNOSIS — Z66 Do not resuscitate: Secondary | ICD-10-CM | POA: Diagnosis not present

## 2015-09-18 DIAGNOSIS — I471 Supraventricular tachycardia: Secondary | ICD-10-CM | POA: Diagnosis not present

## 2015-09-18 DIAGNOSIS — E669 Obesity, unspecified: Secondary | ICD-10-CM | POA: Diagnosis present

## 2015-09-18 DIAGNOSIS — Q211 Atrial septal defect: Secondary | ICD-10-CM | POA: Diagnosis not present

## 2015-09-18 DIAGNOSIS — N049 Nephrotic syndrome with unspecified morphologic changes: Secondary | ICD-10-CM | POA: Insufficient documentation

## 2015-09-18 DIAGNOSIS — E46 Unspecified protein-calorie malnutrition: Secondary | ICD-10-CM | POA: Diagnosis present

## 2015-09-18 DIAGNOSIS — M79604 Pain in right leg: Secondary | ICD-10-CM | POA: Diagnosis not present

## 2015-09-18 DIAGNOSIS — A419 Sepsis, unspecified organism: Secondary | ICD-10-CM

## 2015-09-18 DIAGNOSIS — M109 Gout, unspecified: Secondary | ICD-10-CM | POA: Diagnosis present

## 2015-09-18 DIAGNOSIS — Z9981 Dependence on supplemental oxygen: Secondary | ICD-10-CM

## 2015-09-18 DIAGNOSIS — I63512 Cerebral infarction due to unspecified occlusion or stenosis of left middle cerebral artery: Secondary | ICD-10-CM | POA: Diagnosis present

## 2015-09-18 DIAGNOSIS — J449 Chronic obstructive pulmonary disease, unspecified: Secondary | ICD-10-CM | POA: Diagnosis present

## 2015-09-18 DIAGNOSIS — D7582 Heparin induced thrombocytopenia (HIT): Secondary | ICD-10-CM | POA: Diagnosis present

## 2015-09-18 DIAGNOSIS — J9611 Chronic respiratory failure with hypoxia: Secondary | ICD-10-CM | POA: Diagnosis present

## 2015-09-18 DIAGNOSIS — I132 Hypertensive heart and chronic kidney disease with heart failure and with stage 5 chronic kidney disease, or end stage renal disease: Secondary | ICD-10-CM | POA: Diagnosis present

## 2015-09-18 DIAGNOSIS — I959 Hypotension, unspecified: Secondary | ICD-10-CM | POA: Diagnosis present

## 2015-09-18 DIAGNOSIS — M1990 Unspecified osteoarthritis, unspecified site: Secondary | ICD-10-CM | POA: Diagnosis present

## 2015-09-18 DIAGNOSIS — I509 Heart failure, unspecified: Secondary | ICD-10-CM | POA: Diagnosis not present

## 2015-09-18 DIAGNOSIS — E039 Hypothyroidism, unspecified: Secondary | ICD-10-CM | POA: Diagnosis present

## 2015-09-18 DIAGNOSIS — Z515 Encounter for palliative care: Secondary | ICD-10-CM | POA: Diagnosis not present

## 2015-09-18 DIAGNOSIS — F329 Major depressive disorder, single episode, unspecified: Secondary | ICD-10-CM | POA: Diagnosis present

## 2015-09-18 DIAGNOSIS — M79605 Pain in left leg: Secondary | ICD-10-CM | POA: Diagnosis not present

## 2015-09-18 DIAGNOSIS — R7989 Other specified abnormal findings of blood chemistry: Secondary | ICD-10-CM | POA: Diagnosis present

## 2015-09-18 DIAGNOSIS — I480 Paroxysmal atrial fibrillation: Secondary | ICD-10-CM | POA: Diagnosis present

## 2015-09-18 DIAGNOSIS — N186 End stage renal disease: Secondary | ICD-10-CM | POA: Diagnosis present

## 2015-09-18 DIAGNOSIS — I2781 Cor pulmonale (chronic): Secondary | ICD-10-CM | POA: Diagnosis present

## 2015-09-18 DIAGNOSIS — Z8249 Family history of ischemic heart disease and other diseases of the circulatory system: Secondary | ICD-10-CM

## 2015-09-18 DIAGNOSIS — Z79899 Other long term (current) drug therapy: Secondary | ICD-10-CM

## 2015-09-18 DIAGNOSIS — F32A Depression, unspecified: Secondary | ICD-10-CM | POA: Diagnosis present

## 2015-09-18 DIAGNOSIS — K761 Chronic passive congestion of liver: Secondary | ICD-10-CM | POA: Diagnosis present

## 2015-09-18 DIAGNOSIS — R570 Cardiogenic shock: Secondary | ICD-10-CM | POA: Diagnosis not present

## 2015-09-18 DIAGNOSIS — Z6826 Body mass index (BMI) 26.0-26.9, adult: Secondary | ICD-10-CM | POA: Diagnosis not present

## 2015-09-18 DIAGNOSIS — Z452 Encounter for adjustment and management of vascular access device: Secondary | ICD-10-CM

## 2015-09-18 DIAGNOSIS — F17200 Nicotine dependence, unspecified, uncomplicated: Secondary | ICD-10-CM | POA: Diagnosis present

## 2015-09-18 DIAGNOSIS — I9589 Other hypotension: Secondary | ICD-10-CM | POA: Diagnosis not present

## 2015-09-18 DIAGNOSIS — J45909 Unspecified asthma, uncomplicated: Secondary | ICD-10-CM | POA: Insufficient documentation

## 2015-09-18 DIAGNOSIS — E871 Hypo-osmolality and hyponatremia: Secondary | ICD-10-CM | POA: Diagnosis present

## 2015-09-18 DIAGNOSIS — Z7982 Long term (current) use of aspirin: Secondary | ICD-10-CM

## 2015-09-18 DIAGNOSIS — G4700 Insomnia, unspecified: Secondary | ICD-10-CM | POA: Diagnosis present

## 2015-09-18 DIAGNOSIS — L8990 Pressure ulcer of unspecified site, unspecified stage: Secondary | ICD-10-CM | POA: Insufficient documentation

## 2015-09-18 DIAGNOSIS — I2609 Other pulmonary embolism with acute cor pulmonale: Secondary | ICD-10-CM | POA: Diagnosis not present

## 2015-09-18 DIAGNOSIS — N2581 Secondary hyperparathyroidism of renal origin: Secondary | ICD-10-CM | POA: Diagnosis present

## 2015-09-18 DIAGNOSIS — M79606 Pain in leg, unspecified: Secondary | ICD-10-CM | POA: Diagnosis not present

## 2015-09-18 DIAGNOSIS — F419 Anxiety disorder, unspecified: Secondary | ICD-10-CM | POA: Diagnosis present

## 2015-09-18 DIAGNOSIS — I4891 Unspecified atrial fibrillation: Secondary | ICD-10-CM | POA: Diagnosis not present

## 2015-09-18 DIAGNOSIS — I10 Essential (primary) hypertension: Secondary | ICD-10-CM | POA: Diagnosis present

## 2015-09-18 DIAGNOSIS — I719 Aortic aneurysm of unspecified site, without rupture: Secondary | ICD-10-CM | POA: Diagnosis present

## 2015-09-18 DIAGNOSIS — L89152 Pressure ulcer of sacral region, stage 2: Secondary | ICD-10-CM | POA: Diagnosis present

## 2015-09-18 DIAGNOSIS — M7989 Other specified soft tissue disorders: Secondary | ICD-10-CM

## 2015-09-18 DIAGNOSIS — D72829 Elevated white blood cell count, unspecified: Secondary | ICD-10-CM

## 2015-09-18 DIAGNOSIS — R0902 Hypoxemia: Secondary | ICD-10-CM | POA: Diagnosis present

## 2015-09-18 DIAGNOSIS — E8770 Fluid overload, unspecified: Secondary | ICD-10-CM | POA: Diagnosis present

## 2015-09-18 DIAGNOSIS — D649 Anemia, unspecified: Secondary | ICD-10-CM | POA: Diagnosis present

## 2015-09-18 DIAGNOSIS — I2721 Secondary pulmonary arterial hypertension: Secondary | ICD-10-CM | POA: Insufficient documentation

## 2015-09-18 LAB — CK: Total CK: 74 U/L (ref 38–234)

## 2015-09-18 LAB — COMPREHENSIVE METABOLIC PANEL
ALK PHOS: 103 U/L (ref 38–126)
ALT: 60 U/L — AB (ref 14–54)
AST: 119 U/L — AB (ref 15–41)
Albumin: 3.1 g/dL — ABNORMAL LOW (ref 3.5–5.0)
Anion gap: 14 (ref 5–15)
BUN: 17 mg/dL (ref 6–20)
CALCIUM: 9.4 mg/dL (ref 8.9–10.3)
CHLORIDE: 93 mmol/L — AB (ref 101–111)
CO2: 26 mmol/L (ref 22–32)
CREATININE: 4.59 mg/dL — AB (ref 0.44–1.00)
GFR, EST AFRICAN AMERICAN: 11 mL/min — AB (ref >60)
GFR, EST NON AFRICAN AMERICAN: 10 mL/min — AB (ref >60)
Glucose, Bld: 76 mg/dL (ref 65–99)
Potassium: 4.8 mmol/L (ref 3.5–5.1)
Sodium: 133 mmol/L — ABNORMAL LOW (ref 135–145)
Total Bilirubin: 0.9 mg/dL (ref 0.3–1.2)
Total Protein: 8.3 g/dL — ABNORMAL HIGH (ref 6.5–8.1)

## 2015-09-18 LAB — MAGNESIUM: Magnesium: 1.6 mg/dL — ABNORMAL LOW (ref 1.7–2.4)

## 2015-09-18 LAB — CBC WITH DIFFERENTIAL/PLATELET
Basophils Absolute: 0 10*3/uL (ref 0.0–0.1)
Basophils Relative: 0 %
EOS PCT: 0 %
Eosinophils Absolute: 0.1 10*3/uL (ref 0.0–0.7)
HCT: 32.7 % — ABNORMAL LOW (ref 36.0–46.0)
HEMOGLOBIN: 10.2 g/dL — AB (ref 12.0–15.0)
LYMPHS ABS: 0.6 10*3/uL — AB (ref 0.7–4.0)
LYMPHS PCT: 4 %
MCH: 26.3 pg (ref 26.0–34.0)
MCHC: 31.2 g/dL (ref 30.0–36.0)
MCV: 84.3 fL (ref 78.0–100.0)
MONOS PCT: 13 %
Monocytes Absolute: 2 10*3/uL — ABNORMAL HIGH (ref 0.1–1.0)
NEUTROS PCT: 83 %
Neutro Abs: 12.4 10*3/uL — ABNORMAL HIGH (ref 1.7–7.7)
Platelets: 194 10*3/uL (ref 150–400)
RBC: 3.88 MIL/uL (ref 3.87–5.11)
RDW: 18.4 % — ABNORMAL HIGH (ref 11.5–15.5)
WBC: 14.9 10*3/uL — AB (ref 4.0–10.5)

## 2015-09-18 LAB — TROPONIN I
TROPONIN I: 0.83 ng/mL — AB (ref <0.031)
Troponin I: 0.6 ng/mL (ref <0.031)

## 2015-09-18 LAB — CBG MONITORING, ED: Glucose-Capillary: 57 mg/dL — ABNORMAL LOW (ref 65–99)

## 2015-09-18 LAB — PROCALCITONIN: Procalcitonin: 7.77 ng/mL

## 2015-09-18 LAB — I-STAT ARTERIAL BLOOD GAS, ED
BICARBONATE: 26.6 meq/L — AB (ref 20.0–24.0)
O2 Saturation: 81 %
PH ART: 7.341 — AB (ref 7.350–7.450)
TCO2: 28 mmol/L (ref 0–100)
pCO2 arterial: 49.2 mmHg — ABNORMAL HIGH (ref 35.0–45.0)
pO2, Arterial: 49 mmHg — ABNORMAL LOW (ref 80.0–100.0)

## 2015-09-18 LAB — TSH: TSH: 5.71 u[IU]/mL — ABNORMAL HIGH (ref 0.350–4.500)

## 2015-09-18 LAB — LACTIC ACID, PLASMA
LACTIC ACID, VENOUS: 2.2 mmol/L — AB (ref 0.5–2.0)
Lactic Acid, Venous: 1.7 mmol/L (ref 0.5–2.0)

## 2015-09-18 LAB — APTT: APTT: 40 s — AB (ref 24–37)

## 2015-09-18 LAB — BRAIN NATRIURETIC PEPTIDE: B Natriuretic Peptide: >4500 pg/mL — ABNORMAL HIGH (ref 0.0–100.0)

## 2015-09-18 LAB — PROTIME-INR
INR: 1.62 — ABNORMAL HIGH (ref 0.00–1.49)
Prothrombin Time: 19.3 seconds — ABNORMAL HIGH (ref 11.6–15.2)

## 2015-09-18 MED ORDER — VANCOMYCIN HCL IN DEXTROSE 1-5 GM/200ML-% IV SOLN: 1000.0000 mg | Freq: Once | INTRAVENOUS | Status: DC

## 2015-09-18 MED ORDER — VANCOMYCIN HCL IN DEXTROSE 750-5 MG/150ML-% IV SOLN: 750.0000 mg | INTRAVENOUS | Status: DC

## 2015-09-18 MED ORDER — LEVOTHYROXINE SODIUM 25 MCG PO TABS
25.0000 ug | ORAL_TABLET | Freq: Every day | ORAL | Status: DC
  Administered 2015-09-19 – 2015-09-22 (×4): 25 ug via ORAL
  Filled 2015-09-18 (×5): qty 1

## 2015-09-18 MED ORDER — ZOLPIDEM TARTRATE 5 MG PO TABS
5.0000 mg | ORAL_TABLET | Freq: Every evening | ORAL | Status: DC | PRN
Start: 2015-09-18 — End: 2015-09-22
  Administered 2015-09-18 – 2015-09-20 (×2): 5 mg via ORAL
  Filled 2015-09-18 (×2): qty 1

## 2015-09-18 MED ORDER — PIPERACILLIN-TAZOBACTAM 3.375 G IVPB 30 MIN
3.3750 g | Freq: Once | INTRAVENOUS | Status: AC
  Administered 2015-09-18: 3.375 g via INTRAVENOUS
  Filled 2015-09-18: qty 50

## 2015-09-18 MED ORDER — SODIUM CHLORIDE 0.9 % IV BOLUS (SEPSIS)
250.0000 mL | Freq: Once | INTRAVENOUS | Status: AC
  Administered 2015-09-18: 250 mL via INTRAVENOUS

## 2015-09-18 MED ORDER — SODIUM CHLORIDE 0.9% FLUSH
3.0000 mL | INTRAVENOUS | Status: DC | PRN
  Administered 2015-09-22: 3 mL via INTRAVENOUS
  Filled 2015-09-18: qty 3

## 2015-09-18 MED ORDER — HYDROCODONE-ACETAMINOPHEN 5-325 MG PO TABS
1.0000 | ORAL_TABLET | Freq: Once | ORAL | Status: AC
  Administered 2015-09-18: 1 via ORAL
  Filled 2015-09-18: qty 1

## 2015-09-18 MED ORDER — LEVALBUTEROL HCL 0.63 MG/3ML IN NEBU
0.6300 mg | INHALATION_SOLUTION | RESPIRATORY_TRACT | Status: DC | PRN
  Administered 2015-09-20: 0.63 mg via RESPIRATORY_TRACT
  Filled 2015-09-18: qty 3

## 2015-09-18 MED ORDER — ONDANSETRON HCL 4 MG PO TABS: 4.0000 mg | ORAL_TABLET | Freq: Four times a day (QID) | ORAL | Status: DC | PRN

## 2015-09-18 MED ORDER — SODIUM CHLORIDE 0.9% FLUSH
3.0000 mL | Freq: Two times a day (BID) | INTRAVENOUS | Status: DC
  Administered 2015-09-19 – 2015-09-21 (×5): 3 mL via INTRAVENOUS

## 2015-09-18 MED ORDER — SODIUM CHLORIDE 0.9 % IV BOLUS (SEPSIS)
500.0000 mL | Freq: Once | INTRAVENOUS | Status: AC
  Administered 2015-09-18: 500 mL via INTRAVENOUS

## 2015-09-18 MED ORDER — PIPERACILLIN-TAZOBACTAM IN DEX 2-0.25 GM/50ML IV SOLN
2.2500 g | Freq: Three times a day (TID) | INTRAVENOUS | Status: DC
  Administered 2015-09-19 (×2): 2.25 g via INTRAVENOUS
  Filled 2015-09-18 (×4): qty 50

## 2015-09-18 MED ORDER — SODIUM CHLORIDE 0.9 % IV SOLN
1500.0000 mg | Freq: Once | INTRAVENOUS | Status: AC
  Administered 2015-09-18: 1500 mg via INTRAVENOUS
  Filled 2015-09-18: qty 1500

## 2015-09-18 MED ORDER — LOPERAMIDE HCL 2 MG PO CAPS: 2.0000 mg | ORAL_CAPSULE | Freq: Four times a day (QID) | ORAL | Status: DC | PRN

## 2015-09-18 MED ORDER — SODIUM CHLORIDE 0.9% FLUSH
3.0000 mL | Freq: Two times a day (BID) | INTRAVENOUS | Status: DC
Start: 2015-09-18 — End: 2015-09-20
  Administered 2015-09-19: 3 mL via INTRAVENOUS

## 2015-09-18 MED ORDER — IPRATROPIUM BROMIDE 0.02 % IN SOLN
0.5000 mg | Freq: Four times a day (QID) | RESPIRATORY_TRACT | Status: DC
  Administered 2015-09-18 – 2015-09-19 (×3): 0.5 mg via RESPIRATORY_TRACT
  Filled 2015-09-18 (×3): qty 2.5

## 2015-09-18 MED ORDER — SODIUM CHLORIDE 0.9 % IV SOLN
250.0000 mL | INTRAVENOUS | Status: DC | PRN
  Administered 2015-09-19: 250 mL via INTRAVENOUS

## 2015-09-18 MED ORDER — ASPIRIN 325 MG PO TABS
325.0000 mg | ORAL_TABLET | Freq: Every day | ORAL | Status: DC
  Administered 2015-09-18 – 2015-09-22 (×5): 325 mg via ORAL
  Filled 2015-09-18 (×5): qty 1

## 2015-09-18 MED ORDER — GUAIFENESIN ER 600 MG PO TB12
1200.0000 mg | ORAL_TABLET | Freq: Two times a day (BID) | ORAL | Status: DC
  Administered 2015-09-18 – 2015-09-22 (×7): 1200 mg via ORAL
  Filled 2015-09-18 (×7): qty 2

## 2015-09-18 MED ORDER — HYDROCODONE-ACETAMINOPHEN 5-325 MG PO TABS
1.0000 | ORAL_TABLET | Freq: Four times a day (QID) | ORAL | Status: DC | PRN
  Administered 2015-09-18 – 2015-09-19 (×2): 1 via ORAL
  Filled 2015-09-18 (×2): qty 1

## 2015-09-18 MED ORDER — IOPAMIDOL (ISOVUE-370) INJECTION 76%
INTRAVENOUS | Status: AC
  Administered 2015-09-18: 100 mL
  Filled 2015-09-18: qty 100

## 2015-09-18 MED ORDER — GABAPENTIN 100 MG PO CAPS: 100.0000 mg | ORAL_CAPSULE | Freq: Every day | ORAL | Status: DC | PRN

## 2015-09-18 MED ORDER — IPRATROPIUM-ALBUTEROL 0.5-2.5 (3) MG/3ML IN SOLN
3.0000 mL | Freq: Once | RESPIRATORY_TRACT | Status: AC
  Administered 2015-09-18: 3 mL via RESPIRATORY_TRACT
  Filled 2015-09-18: qty 3

## 2015-09-18 MED ORDER — ALPRAZOLAM 0.25 MG PO TABS
0.2500 mg | ORAL_TABLET | Freq: Three times a day (TID) | ORAL | Status: DC | PRN
  Administered 2015-09-20: 0.25 mg via ORAL
  Filled 2015-09-18: qty 1

## 2015-09-18 MED ORDER — PANTOPRAZOLE SODIUM 40 MG PO TBEC
40.0000 mg | DELAYED_RELEASE_TABLET | Freq: Every day | ORAL | Status: DC
  Administered 2015-09-18 – 2015-09-22 (×5): 40 mg via ORAL
  Filled 2015-09-18 (×5): qty 1

## 2015-09-18 MED ORDER — BUDESONIDE 0.25 MG/2ML IN SUSP
0.2500 mg | Freq: Two times a day (BID) | RESPIRATORY_TRACT | Status: DC
  Administered 2015-09-18 – 2015-09-22 (×8): 0.25 mg via RESPIRATORY_TRACT
  Filled 2015-09-18 (×8): qty 2

## 2015-09-18 MED ORDER — ONDANSETRON HCL 4 MG/2ML IJ SOLN
4.0000 mg | Freq: Four times a day (QID) | INTRAMUSCULAR | Status: DC | PRN
  Administered 2015-09-20: 4 mg via INTRAVENOUS
  Filled 2015-09-18: qty 2

## 2015-09-18 MED ORDER — IPRATROPIUM BROMIDE 0.02 % IN SOLN: 0.5000 mg | RESPIRATORY_TRACT | Status: DC | PRN

## 2015-09-18 MED ORDER — LEVALBUTEROL HCL 0.63 MG/3ML IN NEBU
0.6300 mg | INHALATION_SOLUTION | Freq: Four times a day (QID) | RESPIRATORY_TRACT | Status: DC
  Administered 2015-09-18 – 2015-09-19 (×3): 0.63 mg via RESPIRATORY_TRACT
  Filled 2015-09-18 (×3): qty 3

## 2015-09-18 MED ORDER — RENA-VITE PO TABS
1.0000 | ORAL_TABLET | Freq: Every day | ORAL | Status: DC
  Administered 2015-09-18 – 2015-09-21 (×3): 1 via ORAL
  Filled 2015-09-18 (×3): qty 1

## 2015-09-18 MED ORDER — CALCIUM ACETATE (PHOS BINDER) 667 MG PO CAPS
667.0000 mg | ORAL_CAPSULE | Freq: Three times a day (TID) | ORAL | Status: DC
  Administered 2015-09-19 – 2015-09-22 (×9): 667 mg via ORAL
  Filled 2015-09-18 (×11): qty 1

## 2015-09-18 MED ORDER — LEVETIRACETAM 100 MG/ML PO SOLN
500.0000 mg | Freq: Two times a day (BID) | ORAL | Status: DC
  Administered 2015-09-18 – 2015-09-22 (×8): 500 mg via ORAL
  Filled 2015-09-18 (×10): qty 5

## 2015-09-18 NOTE — Consult Note (Signed)
PULMONARY / CRITICAL CARE MEDICINE   Name: [name] MRN: [id number] DOB: November 05, 1955    ADMISSION DATE:  09/17/2015 CONSULTATION DATE:  09/20/2015  REFERRING MD:  [facility]  CHIEF COMPLAINT: bilateral leg/hip pain  HISTORY OF PRESENT ILLNESS:   89F hx ESRD on HD, prior CVA with residual aphasia, asthma on pulmicort and duoNebs, hypothyroidism, paroxysmal atrial fibrillation/flutter on ASA, patent foramen ovale, HTN, chronic diastolic heart failure, likely pulmonary HTN with RVSP 86 in 11/2014 (dilated RV and RA at that time) presenting with shortness of breath, wheezing and bilateral hip/leg pain. She presents with subacute symptoms of bilateral leg and hip pain for which she has been seen several times in the last 2 weeks (PCP, ED). The patient is aphasic and her daughter provides the bulk of the history. She reportedly dialyzed today and was able to complete her run.  In the ED she was noted to have a leukocytosis and soft blood pressures with SBP 80s-90s. Other labs of note include elevated BNP, troponin 0.83, mildly elevated AST at 119. Cr 4.59, BUN 17, lytes ok except for mild hyponatremia/chloremia. INR 1.62. Blood gas obtained and notable for pH 7.341 / 49.2 / 49.0 / 26.6 with sat 81% on unknown FiO2. SpO2 on 2L is 94%.   On my interview, they report that her shortness of breath is at baseline and that she missed her nebulized breathing treatment earlier today. She uses 2L chronically. Her complaint is the hip and leg pain. It is localized to the hips, does not radiate. The skin is indurated and warm. No drainage or weeping. No other complaints (no fever / chills / nausea / vomiting / diarrhea / constipation / urinary complaints / distal extremity complaints / incontinence)  PAST MEDICAL HISTORY :  She  has a past medical history of Asthma; Hypertension; Gout; Arthritis; Insomnia; Depression; Aortic aneurysm without rupture (HCC); Paroxysmal SVT (supraventricular tachycardia) (HCC); Cor  pulmonale (HCC); PFO (patent foramen ovale); COPD (chronic obstructive pulmonary disease) (HCC); Shortness of breath dyspnea; Stroke [facility]) (06/2014, 11/2014); Thyroid disease; Anxiety; Chronic kidney disease; and Renal insufficiency.  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  Allergies  Allergen Reactions  . Heparin Other (See Comments)    Heparin antibody positive; SRA negative    No current facility-administered medications on file prior to encounter.   Current Outpatient Prescriptions on File Prior to Encounter  Medication Sig  . albuterol (PROVENTIL HFA;VENTOLIN HFA) 108 (90 Base) MCG/ACT inhaler Inhale 2 puffs into the lungs every 6 (six) hours as needed for wheezing or shortness of breath. Reported on 07/06/2015  . albuterol (PROVENTIL) (2.5 MG/3ML) 0.083% nebulizer solution Take 3 mLs (2.5 mg total) by nebulization every 6 (six) hours as needed for wheezing or shortness of breath.  . ALPRAZolam (XANAX) 0.5 MG tablet Xanax 0.25 mg every 8 hours as needed and 0.5 mg every Tuesday Thursday Saturday with dialysis  . aspirin 325 MG tablet Take 1 tablet (325 mg total) by mouth daily.  . calcium acetate (PHOSLO) 667 MG capsule Take 667 mg by mouth 3 (three) times daily with meals.  . gabapentin (NEURONTIN) 100 MG capsule Take 1 capsule (100 mg total) by mouth daily as needed (pain).  Marland Kitchen HYDROcodone-acetaminophen (NORCO/VICODIN) 5-325 MG tablet Take  1 tablet by mouth every 6 (six) hours as needed.  Marland Kitchen ipratropium-albuterol (DUONEB) 0.5-2.5 (3) MG/3ML SOLN Take 3 mLs by nebulization 3 (three) times daily.  Marland Kitchen levETIRAcetam (KEPPRA) 100  MG/ML solution Take 5 mLs (500 mg total) by mouth 2 (two) times daily.  Marland Kitchen levothyroxine (SYNTHROID, LEVOTHROID) 25 MCG tablet Take 1 tablet (25 mcg total) by mouth daily before breakfast.  . loperamide (IMODIUM A-D) 2 MG tablet Take 2 mg by mouth 4 (four) times daily as needed for diarrhea or loose stools.  . metoprolol succinate (TOPROL-XL) 25 MG 24 hr tablet Take 1 tablet (25 mg total) by mouth daily.  . mirtazapine (REMERON) 15 MG tablet Place 1 tablet (15 mg total) into feeding tube at bedtime. (Patient taking differently: Take 15 mg by mouth at bedtime. )  . multivitamin (RENA-VIT) TABS tablet Take 1 tablet by mouth daily.  . pantoprazole (PROTONIX) 40 MG tablet Take 40 mg by mouth daily. Reported on 05/25/2015  . zaleplon (SONATA) 5 MG capsule Take 1 capsule (5 mg total) by mouth at bedtime as needed for sleep.    FAMILY HISTORY:  Her indicated that her mother is deceased.   SOCIAL HISTORY: She  reports that she quit smoking about 10 months ago. Her smoking use included Cigarettes. She has a 10 pack-year smoking history. She has never used smokeless tobacco. She reports that she does not drink alcohol or use illicit drugs.  REVIEW OF SYSTEMS:   See pertinent positives and negatives in HPI. Limited ROS 2/2 speech difficulties.  SUBJECTIVE:  See HPI  VITAL SIGNS: BP 103/84 mmHg  Pulse 99  Temp(Src) 98.7 F (37.1 C) (Axillary)  Resp 23  SpO2 94%  HEMODYNAMICS:    VENTILATOR SETTINGS:    INTAKE / OUTPUT:    PHYSICAL EXAMINATION:  General Well nourished, well developed, no apparent distress  HEENT No gross abnormalities. Oropharynx clear.   Pulmonary Clear to auscultation bilaterally with no wheezes, rales or ronchi. Poor effort, symmetrical expansion.   Cardiovascular Tachy low 100s. Regular rhythm. S1, s2. No m/r/g. PMI displaced rightward with marked RV heave noted. Distal pulses palpable. LUE AVF with palpable thrill  Abdomen Soft, non-tender, non-distended, positive  bowel sounds, no palpable organomegaly or masses. Ventral hernia easily reduced. Midline scar and prior PEG site scar. Normoresonant to percussion.  Musculoskeletal Normal tone, moves all extremities. Grossly normal.   Lymphatics No cervical, supraclavicular or axillary adenopathy.   Neurologic Expressive aphasia with some delay in receptive comprehension. Follows 2-step commands. Strength 4+ /5 throughout.   Skin/Integuement No rash, no cyanosis, no clubbing. Tender to palpation over greater trochanter region bilaterally with marked induration and soft tissue swelling extending around to her sacrum. No drainage. Slightly warm to touch. No erythema noted.      LABS:  BMET  Recent Labs Lab 09/13/15 1555 09/16/15 1903 09/06/2015 1454  NA 131* 133* 133*  K 4.8 4.4 4.8  CL 94* 95* 93*  CO2 18* 24 26  BUN 60* 20 17  CREATININE 10.02* 5.29* 4.59*  GLUCOSE 33* 55* 76    Electrolytes  Recent Labs Lab 09/13/15 1555 09/16/15 1903 09/03/2015 1454  CALCIUM 8.6* 9.2 9.4    CBC  Recent Labs Lab 09/13/15 1555 09/16/15 1903 09/30/2015 1620  WBC 6.6 10.5 14.9*  HGB 11.0* 11.9* 10.2*  HCT 34.4* 36.8 32.7*  PLT 187 214 194    Coag's  Recent Labs Lab 09/10/2015 1818  APTT 40*  INR 1.62*    Sepsis Markers No results  for input(s): LATICACIDVEN, PROCALCITON, O2SATVEN in the last 168 hours.  ABG  Recent Labs Lab October 17, 2015 1735  PHART 7.341*  PCO2ART 49.2*  PO2ART 49.0*    Liver Enzymes  Recent Labs Lab 09/16/15 1903 2015/10/17 1454  AST 47* 119*  ALT 25 60*  ALKPHOS 90 103  BILITOT 1.3* 0.9  ALBUMIN 3.1* 3.1*    Cardiac Enzymes  Recent Labs Lab 2015/10/17 1454  TROPONINI 0.83*    Glucose  Recent Labs Lab 09/16/15 2126 09/16/15 2155 09/16/15 2228 09/16/15 2358 09/17/15 0144 17-Oct-2015 1603  GLUCAP 45* 80 97 129* 82 57*    Imaging Dg Chest 2 View  Oct 17, 2015  CLINICAL DATA:  Shortness of breath, chest pain. EXAM: CHEST  2 VIEW COMPARISON:  Radiographs  of Maryanna 12, 2017. FINDINGS: Stable cardiomegaly. Minimal bilateral pleural effusions are noted. No pneumothorax is noted. No acute pulmonary disease is noted. Bony thorax is unremarkable. IMPRESSION: Stable cardiomegaly. Minimal bilateral pleural effusions. No other abnormality seen in the chest. Electronically Signed   By: Lupita Raider, M.D.   On: 17-Oct-2015 15:55     STUDIES:  CXR as above LE dopplers pending CTA PE pending  CULTURES: Blood cx 6/17 >>  ANTIBIOTICS: Vancomycin 6/17 >> Zosyn 6/17 >>  SIGNIFICANT EVENTS:   LINES/TUBES: PIV AVF  DISCUSSION: Ms. Kluver is a 79F with prior history of stroke with resultant aphasia, chronic hypoxemic respiratory failure on 2L O2 continuously, asthma on pulmicort and duonebs, RV dilation on TTE and known chronic diastolic dysfunction, ESRD on TTSa HD, HTN, hypothyroidism and recently diagnosed pAfib/flutter on ASA. She presents with bilateral hip/leg pain that seems related to marked soft tissue swelling and induration in this region. Cellulitis cannot be excluded. Her shortness of breath is reportedly at baseline and she is on her home O2 requirement. Her low blood pressures may be related to her dialysis session today or the medications she has recently been started on (gabapentin, opioids) for her leg discomfort. She would benefit from increased activity and offloading of her pelvis to try to mobilize the fluid that seems to be collecting in the soft tissues around her pelvic girdle.   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 P:   Continue supplemental O2 Continue home asthma regimen (pulmicort + DuoNebs) She would likely benefit from additional volume removal as tolerated by blood pressure  CARDIOVASCULAR A:  Tachycardia Paroxysmal a fib / flutter on ASA Borderline low blood pressure RV dilation with severe TR and  elevated RVSP on prior echo and CT findings consistent with RH failure (pHTN likely WHO group II) P:  Supportive care Close attention to volume status Optimize medical management of diastolic dysfunction Consider repeat TTE  RENAL A:   ESRD on TTSa HD P:   Per nephrology  GASTROINTESTINAL A:   No acute issues P:     HEMATOLOGIC A:   Chronic anemia related to ESRD P:    INFECTIOUS A:   Leukocytosis - cannot exclude cellulitis or other infection P:   Follow cultures Continue vancomycin and zosyn  ENDOCRINE A:   Hypothyroidism P:   Recheck TSH  NEUROLOGIC A:   Prior CVA with residual aphasia on ASA P:     FAMILY  - Updates: daughter at bedside. Code status discussion: patient desires to be FULL CODE.    The patient is critically ill with multiple organ system failure and requires high complexity decision making  for assessment and support, frequent evaluation and titration of therapies, advanced monitoring, review of radiographic studies and interpretation of complex data.   Critical Care Time devoted to patient care services, exclusive of separately billable procedures, described in this note is 40 minutes.   Nita SickleSarah Ellen E. Stephens, MD Pulmonary and Critical Care Medicine [facility] Pager: [phone]  09/27/2015, 7:11 PM

## 2015-09-18 NOTE — ED Notes (Signed)
To hallway bed via EMS.  Pt had dialysis this morning, after arriving at home, shortness of breath and bilateral leg pain.

## 2015-09-18 NOTE — Consult Note (Addendum)
Referring Physician:  Primary Physician: Primary Cardiologist: Reason for Consultation:  Elevated troponin   HPI: Patient is a 60 yo F with h/o ESRD on HD, hypertension, COPD on 2L home O2, severe pulmonary hypertension, atrial fibrillation and tachycardia, and CVA who presents with painful bilateral LE edema x 2 weeks.  History from patient is limited by expressive aphasia, so majority was obtained by her daughter, who was at the bedside in the ED and chart review.  Patient started complaining of painful LE swelling, especially in her thighs.  She denies worsening shortness of breath or abdominal pain.  She has been compliant with her home O2 and dialysis.  Her daughter dose not believe they have had any issues with dialysis.  She was dialyzed today, but less fluid was removed due to hypotension.  She had an echo last year, see detail below, which revealed normal LV function, but severe PH with RV dilation and dysfunction.  Her daughter believes she has had PFTs, but is not sure of the results and states she only uses a Ventolin inhaler daily.  In the ED this evening, her BNP > 45000, troponin was mildly elevated at 0.8. LFTs were elevated and INR was 1.7. She was having multiple runs of atrial tachycardia on the monitor at the time of my interview.  She follows with cardiology and their recent appointments have been focused on whether or not to be started on anticoagulation. She is concerning given her history of GIB, requiring transfusion and surgery.   ECHO 11/12/14 Left ventricle: The cavity size was moderately reduced. Wall  thickness was increased in a pattern of severe LVH. Systolic  function was normal. The estimated ejection fraction was in the  range of 60% to 65%. Wall motion was normal; there were no  regional wall motion abnormalities. - Right ventricle: The cavity size was mildly dilated. Systolic  function was mildly reduced. - Right atrium: The atrium was  severely dilated. - Tricuspid valve: There was severe regurgitation. - Pulmonary arteries: Systolic pressure was severely increased. PA  peak pressure: 86 mm Hg (S). - Pericardium, extracardiac: A small pericardial effusion was  identified circumferential to the heart. There was no evidence of  hemodynamic compromise.   Review of Systems: as per HPI  Past Medical History  Diagnosis Date  . Asthma   . Hypertension   . Gout   . Arthritis   . Insomnia   . Depression   . Aortic aneurysm without rupture (HCC)     3cm by CT 08/03/14  . Paroxysmal SVT (supraventricular tachycardia) (HCC)   . Cor pulmonale (HCC)   . PFO (patent foramen ovale)   . COPD (chronic obstructive pulmonary disease) (HCC)   . Shortness of breath dyspnea     on home O2 at 2 L  . Stroke [facility](HCC) 06/2014, 11/2014    still has difficulty getting out some words  . Thyroid disease   . Anxiety   . Chronic kidney disease     dialysis on T/Th/Sa  . Renal insufficiency     No current facility-administered medications for this encounter.   Current Outpatient Prescriptions  Medication Sig Dispense Refill  . albuterol (PROVENTIL HFA;VENTOLIN HFA) 108 (90 Base) MCG/ACT inhaler Inhale 2 puffs into the lungs every 6 (six) hours as needed for wheezing or shortness of breath. Reported on 07/06/2015 18 g 6  . albuterol (PROVENTIL) (2.5 MG/3ML) 0.083% nebulizer solution Take 3 mLs (2.5 mg total) by nebulization every 6 (six) hours  as needed for wheezing or shortness of breath. 75 mL 12  . ALPRAZolam (XANAX) 0.5 MG tablet Xanax 0.25 mg every 8 hours as needed and 0.5 mg every Tuesday Thursday Saturday with dialysis 30 tablet 1  . aspirin 325 MG tablet Take 1 tablet (325 mg total) by mouth daily.    . calcium acetate (PHOSLO) 667 MG capsule Take 667 mg by mouth 3 (three) times daily with meals.    . gabapentin (NEURONTIN) 100 MG capsule Take 1 capsule (100 mg total) by mouth daily as needed (pain). 30 capsule 1  .  HYDROcodone-acetaminophen (NORCO/VICODIN) 5-325 MG tablet Take 1 tablet by mouth every 6 (six) hours as needed. 20 tablet 0  . ipratropium-albuterol (DUONEB) 0.5-2.5 (3) MG/3ML SOLN Take 3 mLs by nebulization 3 (three) times daily. 360 mL 1  . levETIRAcetam (KEPPRA) 100 MG/ML solution Take 5 mLs (500 mg total) by mouth 2 (two) times daily. 473 mL 12  . levothyroxine (SYNTHROID, LEVOTHROID) 25 MCG tablet Take 1 tablet (25 mcg total) by mouth daily before breakfast. 30 tablet 1  . loperamide (IMODIUM A-D) 2 MG tablet Take 2 mg by mouth 4 (four) times daily as needed for diarrhea or loose stools.    . metoprolol succinate (TOPROL-XL) 25 MG 24 hr tablet Take 1 tablet (25 mg total) by mouth daily. 90 tablet 3  . mirtazapine (REMERON) 15 MG tablet Place 1 tablet (15 mg total) into feeding tube at bedtime. (Patient taking differently: Take 15 mg by mouth at bedtime. ) 30 tablet 1  . multivitamin (RENA-VIT) TABS tablet Take 1 tablet by mouth daily.    . pantoprazole (PROTONIX) 40 MG tablet Take 40 mg by mouth daily. Reported on 05/25/2015    . zaleplon (SONATA) 5 MG capsule Take 1 capsule (5 mg total) by mouth at bedtime as needed for sleep. 30 capsule 2     Allergies  Allergen Reactions  . Heparin Other (See Comments)    Heparin antibody positive; SRA negative    Social History   Social History  . Marital Status: Single    Spouse Name: N/A  . Number of Children: N/A  . Years of Education: N/A   Occupational History  . Not on file.   Social History Main Topics  . Smoking status: Former Smoker -- 0.50 packs/day for 20 years    Types: Cigarettes    Quit date: 11/05/2014  . Smokeless tobacco: Never Used  . Alcohol Use: No     Comment: occ  . Drug Use: No  . Sexual Activity: Not on file   Other Topics Concern  . Not on file   Social History Narrative    Family History  Problem Relation Age of Onset  . Diabetes Mother   . Hypertension Mother   . Heart disease Mother   . Heart  attack Mother   . Peripheral vascular disease Mother   . Diabetes Father   . Heart disease Father   . Hypertension Father   . Diabetes Sister   . Hypertension Sister   . Heart disease Sister     before age 53  . Heart attack Sister   . Peripheral vascular disease Sister   . Heart disease Brother   . Hyperlipidemia Daughter     PHYSICAL EXAM: Filed Vitals:   2015/09/29 1734 09-29-15 1800  BP: 87/73 91/70  Pulse: 101   Temp:    Resp: 28 27    No intake or output data in the 24 hours  ending 09/08/2015 1804  General:  AA F, in mild distress, appears older than stated age HEENT: normal Neck: supple. JVP to the earlobe Cor: distant heart sounds, 3/6 holosystolic murmur at left lower sternal border, palpable RV heave Lungs: scattered wheezes, mild bibasilar crackles Abdomen: soft, nontender, nondistended. No hepatosplenomegaly. No bruits or masses. Good bowel sounds. Extremities: LE are very tender to light palpation, there appears to be at least 1+ pitting, dependent edema in the thighs Neuro: alert & oriented x 3, +aphasia  ECG: sinus tachy, biatrial enlargement, right axis deviation, poor r wave progression  Results for orders placed or performed during the hospital encounter of 09/16/2015 (from the past 24 hour(s))  Comprehensive metabolic panel     Status: Abnormal   Collection Time: 09/14/2015  2:54 PM  Result Value Ref Range   Sodium 133 (L) 135 - 145 mmol/L   Potassium 4.8 3.5 - 5.1 mmol/L   Chloride 93 (L) 101 - 111 mmol/L   CO2 26 22 - 32 mmol/L   Glucose, Bld 76 65 - 99 mg/dL   BUN 17 6 - 20 mg/dL   Creatinine, Ser 1.61 (H) 0.44 - 1.00 mg/dL   Calcium 9.4 8.9 - 09.6 mg/dL   Total Protein 8.3 (H) 6.5 - 8.1 g/dL   Albumin 3.1 (L) 3.5 - 5.0 g/dL   AST 045 (H) 15 - 41 U/L   ALT 60 (H) 14 - 54 U/L   Alkaline Phosphatase 103 38 - 126 U/L   Total Bilirubin 0.9 0.3 - 1.2 mg/dL   GFR calc non Af Amer 10 (L) >60 mL/min   GFR calc Af Amer 11 (L) >60 mL/min   Anion gap 14 5 -  15  Troponin I (MHP)     Status: Abnormal   Collection Time: 09/25/2015  2:54 PM  Result Value Ref Range   Troponin I 0.83 (HH) <0.031 ng/mL  Brain natriuretic peptide     Status: Abnormal   Collection Time: 09/15/2015  2:54 PM  Result Value Ref Range   B Natriuretic Peptide >4500.0 (H) 0.0 - 100.0 pg/mL  CBG monitoring, ED     Status: Abnormal   Collection Time: 09/30/2015  4:03 PM  Result Value Ref Range   Glucose-Capillary 57 (L) 65 - 99 mg/dL  CK     Status: None   Collection Time: 09/02/2015  4:20 PM  Result Value Ref Range   Total CK 74 38 - 234 U/L  CBC with Differential/Platelet     Status: Abnormal   Collection Time: 09/13/2015  4:20 PM  Result Value Ref Range   WBC 14.9 (H) 4.0 - 10.5 K/uL   RBC 3.88 3.87 - 5.11 MIL/uL   Hemoglobin 10.2 (L) 12.0 - 15.0 g/dL   HCT 40.9 (L) 81.1 - 91.4 %   MCV 84.3 78.0 - 100.0 fL   MCH 26.3 26.0 - 34.0 pg   MCHC 31.2 30.0 - 36.0 g/dL   RDW 78.2 (H) 95.6 - 21.3 %   Platelets 194 150 - 400 K/uL   Neutrophils Relative % 83 %   Neutro Abs 12.4 (H) 1.7 - 7.7 K/uL   Lymphocytes Relative 4 %   Lymphs Abs 0.6 (L) 0.7 - 4.0 K/uL   Monocytes Relative 13 %   Monocytes Absolute 2.0 (H) 0.1 - 1.0 K/uL   Eosinophils Relative 0 %   Eosinophils Absolute 0.1 0.0 - 0.7 K/uL   Basophils Relative 0 %   Basophils Absolute 0.0 0.0 - 0.1 K/uL  I-Stat arterial blood gas, ED     Status: Abnormal   Collection Time: 09/29/2015  5:35 PM  Result Value Ref Range   pH, Arterial 7.341 (L) 7.350 - 7.450   pCO2 arterial 49.2 (H) 35.0 - 45.0 mmHg   pO2, Arterial 49.0 (L) 80.0 - 100.0 mmHg   Bicarbonate 26.6 (H) 20.0 - 24.0 mEq/L   TCO2 28 0 - 100 mmol/L   O2 Saturation 81.0 %   Patient temperature 98.7 F    Collection site RADIAL, ALLEN'S TEST ACCEPTABLE    Drawn by Operator    Sample type ARTERIAL    Dg Chest 2 View  09/29/2015  CLINICAL DATA:  Shortness of breath, chest pain. EXAM: CHEST  2 VIEW COMPARISON:  Radiographs of [place] 12, 2017. FINDINGS: Stable  cardiomegaly. Minimal bilateral pleural effusions are noted. No pneumothorax is noted. No acute pulmonary disease is noted. Bony thorax is unremarkable. IMPRESSION: Stable cardiomegaly. Minimal bilateral pleural effusions. No other abnormality seen in the chest. Electronically Signed   By: Lupita Raider, M.D.   On: 09-29-15 15:55     ASSESSMENT/PLAN:  1. Right heart failure. Patient with signs and symptoms of right heart failure in the setting of known severe pulmonary hypertension and RV dysfunction. Acute PE needs to be ruled-out, given patient's immobility she is at risk. Reassuringly, her LE Dopplers were negative for VTE.  -- recommend CTA chest, PE protocol --> negative for PE -- repeat TTE to evaluate for interval changes since last year -- diurese with dialysis and attempt diuresis with IV lasix, although it is not clear how much urine she makes -- if patient develops worsening hypotension, consider inotropic support with dobutamine gtt -- If patient has not had an extensive work-up for pulmonary hypertension, that should be performed, including VQ scan, PFTs, high res CT chest, and sleep study  2. Elevated troponin, likely due to right heart failure, less likely ACS, although still on the differential. EKG without ischemic changes -- trend troponins -- would not recommend anticoagulation, at this point, unless troponins increase significantly  3. Paroxsymal atrial tachycardia and history of atrial fibrillation, multiple episodes of atrial tachycardia during evaluation.  Patient and family have been hesitant in the past about anticoagulation due to history of significant GIB. CHADS-Vasc = 5 - should discuss long-term anticoagulation prior to discharge

## 2015-09-18 NOTE — ED Notes (Signed)
Attempted report x2. RN would not take report, advised pt waiting for room. Still would not take report due to "being in a patient's room"

## 2015-09-18 NOTE — ED Provider Notes (Signed)
CSN: [id number]     Arrival date & time 09/05/2015  1402 History   First MD Initiated Contact with Patient 09/21/2015 1504     Chief Complaint  Patient presents with  . Shortness of Breath     (Consider location/radiation/quality/duration/timing/severity/associated sxs/prior Treatment) Patient is a 60 y.o. female presenting with shortness of breath and leg pain. The history is provided by the patient.  Shortness of Breath Severity:  Moderate Onset quality:  Gradual Duration:  3 days Timing:  Constant Progression:  Unchanged Chronicity:  Chronic Context comment:  Upper respiratory congestion, unable to tolerate nebs at home Relieved by:  Nothing Worsened by:  Nothing tried Ineffective treatments:  None tried Associated symptoms: wheezing   Associated symptoms: no abdominal pain and no fever   Risk factors comment:  ESRD dialysis Leg Pain Time since incident:  2 weeks Injury: no   Pain details:    Quality:  Aching   Radiates to:  Does not radiate   Severity:  Moderate   Onset quality:  Gradual   Duration:  2 weeks   Timing:  Constant   Progression:  Worsening Chronicity:  Recurrent Prior injury to area:  No Relieved by:  Nothing Worsened by:  Nothing tried Ineffective treatments: norco. Associated symptoms: swelling (diffuse)   Associated symptoms: no fever     Past Medical History  Diagnosis Date  . Asthma   . Hypertension   . Gout   . Arthritis   . Insomnia   . Depression   . Aortic aneurysm without rupture (HCC)     3cm by CT 08/03/14  . Paroxysmal SVT (supraventricular tachycardia) (HCC)   . Cor pulmonale (HCC)   . PFO (patent foramen ovale)   . COPD (chronic obstructive pulmonary disease) (HCC)   . Shortness of breath dyspnea     on home O2 at 2 L  . Stroke [facility]) 06/2014, 11/2014    still has difficulty getting out some words  . Thyroid disease   . Anxiety   . Chronic kidney disease     dialysis on T/Th/Sa  . Renal insufficiency    Past Surgical History   Procedure Laterality Date  . Multiple tooth extractions    . Av fistula placement Left 01/28/2014    Procedure: ARTERIOVENOUS (AV) FISTULA CREATION;  Surgeon: Sherren Kerns, MD;  Location: [facility] OR;  Service: Vascular;  Laterality: Left;  . Tee without cardioversion N/A 06/23/2014    Procedure: TRANSESOPHAGEAL ECHOCARDIOGRAM (TEE);  Surgeon: Lewayne Bunting, MD;  Location: [facility] ENDOSCOPY;  Service: Cardiovascular;  Laterality: N/A;  . Insertion of dialysis catheter Right 06/27/2014    Procedure: INSERTION OF Right Internal Jugular DIATEK CATHETER;  Surgeon: Pryor Ochoa, MD;  Location: [facility] OR;  Service: Vascular;  Laterality: Right;  . Laparotomy N/A 07/01/2014    Procedure: EXPLORATORY LAPAROTOMY WITH CLOSURE OF PERFORATED PYLORIC ULCER;  Surgeon: Claud Kelp, MD;  Location: [facility] OR;  Service: General;  Laterality: N/A;  . Bowel resection N/A 07/01/2014    Procedure: SMALL BOWEL RESECTION;  Surgeon: Claud Kelp, MD;  Location: [facility] OR;  Service: General;  Laterality: N/A;  . 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 Resection of arteriovenous fistula aneurysm Left 07/09/2015    Procedure: LEFT UPPER ARM  ARTERIOVENOUS FISTULA  ANEURYSMORRPHY;  Surgeon: Pryor Ochoa, MD;  Location: [facility] OR;  Service: Vascular;  Laterality: Left;   Family History  Problem Relation Age of Onset  . Diabetes Mother   . Hypertension Mother   . Heart disease Mother   . Heart attack Mother   . Peripheral vascular disease Mother   . Diabetes Father   . Heart disease Father   . Hypertension Father   . Diabetes Sister   . Hypertension Sister   . Heart disease Sister     before age 67  . Heart attack Sister   . Peripheral vascular disease Sister   . Heart disease Brother   . Hyperlipidemia Daughter     Social History  Substance Use Topics  . Smoking status: Former Smoker -- 0.50 packs/day for 20 years    Types: Cigarettes    Quit date: 11/05/2014  . Smokeless tobacco: Never Used  . Alcohol Use: No     Comment: occ   OB History    No data available     Review of Systems  Constitutional: Negative for fever.  Respiratory: Positive for shortness of breath and wheezing.   Gastrointestinal: Negative for abdominal pain.  All other systems reviewed and are negative.     Allergies  Heparin  Home Medications   Prior to Admission medications   Medication Sig Start Date End Date Taking? Authorizing Provider  albuterol (PROVENTIL HFA;VENTOLIN HFA) 108 (90 Base) MCG/ACT inhaler Inhale 2 puffs into the lungs every 6 (six) hours as needed for wheezing or shortness of breath. Reported on 07/06/2015 09/10/15  Yes Myrlene Broker, MD  albuterol (PROVENTIL) (2.5 MG/3ML) 0.083% nebulizer solution Take 3 mLs (2.5 mg total) by nebulization every 6 (six) hours as needed for wheezing or shortness of breath. 12/26/14  Yes Ranelle Oyster, MD  ALPRAZolam Prudy Feeler) 0.5 MG tablet Xanax 0.25 mg every 8 hours as needed and 0.5 mg every Tuesday Thursday Saturday with dialysis 09/10/15  Yes Myrlene Broker, MD  aspirin 325 MG tablet Take 1 tablet (325 mg total) by mouth daily. 12/24/14  Yes Everlynn Sagun J Angiulli, PA-C  calcium acetate (PHOSLO) 667 MG capsule Take 667 mg by mouth 3 (three) times daily with meals.   Yes Historical Provider, MD  gabapentin (NEURONTIN) 100 MG capsule Take 1 capsule (100 mg total) by mouth daily as needed (pain). 09/10/15  Yes Myrlene Broker, MD  HYDROcodone-acetaminophen (NORCO/VICODIN) 5-325 MG tablet Take 1 tablet by mouth every 6 (six) hours as needed. 09/17/15  Yes Tiffany Neva Seat, PA-C  ipratropium-albuterol (DUONEB) 0.5-2.5 (3) MG/3ML SOLN Take 3 mLs by nebulization 3 (three) times daily. 12/24/14  Yes Breslyn Abdo J Angiulli, PA-C  levETIRAcetam (KEPPRA) 100 MG/ML solution Take  5 mLs (500 mg total) by mouth 2 (two) times daily. 12/24/14  Yes Christean Silvestri J Angiulli, PA-C  levothyroxine (SYNTHROID, LEVOTHROID) 25 MCG tablet Take 1 tablet (25 mcg total) by mouth daily before breakfast. 12/24/14  Yes Karlis Cregg J Angiulli, PA-C  loperamide (IMODIUM A-D) 2 MG tablet Take 2 mg by mouth 4 (four) times daily as needed for diarrhea or loose stools.   Yes Historical Provider, MD  metoprolol succinate (TOPROL-XL) 25 MG 24 hr tablet Take 1 tablet (25 mg total) by mouth daily. 05/25/15  Yes Myrlene Broker, MD  mirtazapine (REMERON) 15 MG tablet Place 1 tablet (15 mg total) into feeding tube at bedtime. Patient taking differently: Take 15 mg by mouth at bedtime.  12/24/14  Yes Briahna Pescador J Angiulli, PA-C  multivitamin (RENA-VIT)  TABS tablet Take 1 tablet by mouth daily.   Yes Historical Provider, MD  pantoprazole (PROTONIX) 40 MG tablet Take 40 mg by mouth daily. Reported on 05/25/2015   Yes Historical Provider, MD  zaleplon (SONATA) 5 MG capsule Take 1 capsule (5 mg total) by mouth at bedtime as needed for sleep. 09/10/15  Yes Myrlene Broker, MD   BP 91/69 mmHg  Pulse 110  Temp(Src) 98.7 F (37.1 C) (Axillary)  Resp 25 Physical Exam  Constitutional: She appears well-developed and well-nourished. No distress.  HENT:  Head: Normocephalic.  Eyes: Conjunctivae are normal.  Neck: Neck supple. No tracheal deviation present.  Cardiovascular: Normal rate and regular rhythm.   Pulmonary/Chest: Bradypnea noted. No respiratory distress. She has wheezes (diffuse expiratory R>L).  Upper airway congestion  Abdominal: Soft. She exhibits no distension. There is no tenderness.  Chronic ventral hernia with well-healed midline remote surgical incision, easily reducible  Musculoskeletal:  4+ bilateral lower extremity pitting edema to level of hips, tender to palpation  Neurological: She is alert. She is not disoriented (patient communicates with daughter threw noise and hand gestures but is at  baseline).  Skin: Skin is warm and dry.  Psychiatric: She has a normal mood and affect.    ED Course  Procedures (including critical care time) Labs Review Labs Reviewed  COMPREHENSIVE METABOLIC PANEL - Abnormal; Notable for the following:    Sodium 133 (*)    Chloride 93 (*)    Creatinine, Ser 4.59 (*)    Total Protein 8.3 (*)    Albumin 3.1 (*)    AST 119 (*)    ALT 60 (*)    GFR calc non Af Amer 10 (*)    GFR calc Af Amer 11 (*)    All other components within normal limits  TROPONIN I - Abnormal; Notable for the following:    Troponin I 0.83 (*)    All other components within normal limits  BRAIN NATRIURETIC PEPTIDE - Abnormal; Notable for the following:    B Natriuretic Peptide >4500.0 (*)    All other components within normal limits  CBC WITH DIFFERENTIAL/PLATELET - Abnormal; Notable for the following:    WBC 14.9 (*)    Hemoglobin 10.2 (*)    HCT 32.7 (*)    RDW 18.4 (*)    Neutro Abs 12.4 (*)    Lymphs Abs 0.6 (*)    Monocytes Absolute 2.0 (*)    All other components within normal limits  CBG MONITORING, ED - Abnormal; Notable for the following:    Glucose-Capillary 57 (*)    All other components within normal limits  URINE CULTURE  CULTURE, BLOOD (ROUTINE X 2)  CULTURE, BLOOD (ROUTINE X 2)  CK  CBC WITH DIFFERENTIAL/PLATELET  BLOOD GAS, ARTERIAL  URINE RAPID DRUG SCREEN, HOSP PERFORMED  URINALYSIS, ROUTINE W REFLEX MICROSCOPIC (NOT AT [facility])    Imaging Review Dg Chest 2 View  09/27/2015  CLINICAL DATA:  Shortness of breath, chest pain. EXAM: CHEST  2 VIEW COMPARISON:  Radiographs of Natacha 12, 2017. FINDINGS: Stable cardiomegaly. Minimal bilateral pleural effusions are noted. No pneumothorax is noted. No acute pulmonary disease is noted. Bony thorax is unremarkable. IMPRESSION: Stable cardiomegaly. Minimal bilateral pleural effusions. No other abnormality seen in the chest. Electronically Signed   By: Lupita Raider, M.D.   On: 26-Sep-2015 15:55   I have  personally reviewed and evaluated these images and lab results as part of my medical decision-making.  ED ECG REPORT  Date: 09/06/2015  Rate: 103  Rhythm: sinus tachycardia  QRS Axis: right  Intervals: normal  ST/T Wave abnormalities: nonspecific T wave abnormality, unchanged from prior  Conduction Disutrbances:none  Narrative Interpretation:   Old EKG Reviewed: unchanged  I have personally reviewed the EKG tracing and agree with the computerized printout as noted.   MDM   Final diagnoses:  Anasarca associated with disorder of kidney  Hypotension, unspecified hypotension type  Elevated troponin  Acute on chronic diastolic heart failure (HCC)    60 year old female with history of end-stage renal disease, prior stroke, ongoing worsening pain and swelling in bilateral lower extremities presents today with some increasing shortness of breath after not tolerating her nebulizer treatments at home. She is on 2 L of oxygen continuously all the time and is here today on the same with some wheezing but no acute respiratory failure signs. Her progressive bilateral pitting edema is concerning for fluid shift possibly related to heart failure exacerbation.   She is a complicated picture with likely intravascular depletion despite progressive diastolic heart failure as she has a slightly depressed blood pressure here today. She remains tachypneic and has evidence of possible demand ischemia with elevation of troponin above baseline readings from previous visits. She is provided a small fluid bolus to help replete her intravascular volume. 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 At this time I do not feel her complicated course could be managed on an outpatient basis and she will require admission for monitoring of hemodynamics and trending labs values to evaluate for atypical ACS  versus diastolic heart failure. Hospitalist Dr. Janee Mornhompson was consulted for admission and will see the patient in the emergency department. He requested that I call cardiology for consultation and nephrology to make them aware patient was in hospital.  Cardiology agrees to consult. No call was returned from nephrology but no emergent indications for dialysis. BP responsive to small fluid bolus challenge.     Lyndal Pulleyaniel Janisse Ghan, MD 09/19/15 [phone]

## 2015-09-18 NOTE — ED Notes (Signed)
Obtained all new lab specimens during IV ultrasound insertion and sent to lab in case others were hemolyzed also.

## 2015-09-18 NOTE — Progress Notes (Signed)
VASCULAR LAB PRELIMINARY  PRELIMINARY  PRELIMINARY  PRELIMINARY  Bilateral lower extremity venous duplex completed.    Preliminary report:  There is no obvious evidence of DVT or SVT noted in the bilateral lower extremities.  Waveforms are pulsatile which could be indicative of fluid overload.  Noella Kipnis, RVT 09/17/2015, 5:12 PM

## 2015-09-18 NOTE — H&P (Signed)
History and Physical    [name] WUJ:[id number] DOB: December 28, 1955 DOA: Feb 12, 2016  PCP: Myrlene BrokerElizabeth A Crawford, MD  Patient coming from: Home  Chief Complaint: Bilateral hip pain/shortness of breath/wheezing  HPI: [name] is a 60 y.o. 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 Patient with an aphasia and a such most of the information obtained was from patient's daughter at bedside. Patient had presented to PCPs office approximately 8 days prior to admission with complaints of bilateral hip pain and was given some pain medications/gabapentin which seem to have eased off the pain. Patient presented to the ED 5 days prior to admission with complaints of bilateral hip pain and per daughter was told to add Tylenol to her pain regimen of gabapentin. Patient presented back to the ED 2 days prior to admission with similar symptoms and was prescribed some narcotic pain medication with some improvement with pain.  Patient presents back to the ED with complaints of bilateral hip pain, worsening shortness of breath with some associated wheezing, chills which were daughter has been ongoing. Patient denies any fevers, no nausea, no vomiting, no abdominal pain, no diarrhea, no constipation, no melena, no hematemesis, no hematochezia, no dysuria, no visual changes, no asymmetric weakness or numbness. Per daughter patient is pretty much sedentary as side from walking from the bedroom and in the house. He was also noted that patient had gone to dialysis on the day of admission and per daughter finished dialysis session.   ED Course:  Patient seen in the ED chest x-ray which was done was negative for any acute infiltrate. BNP obtained was graded and 4500. First set of troponin was 0.83. Total CK level was 74. Comprehensive metabolic profile obtained at a sodium of 133 chloride of 93 creatinine of 4.59 without min of 3.1 AST of 119 ALT of 60 otherwise was within normal limits. CBC had a white count of 14.9 hemoglobin of 10.2 with a left shift of 12.4. EKG with a sinus tachycardia with right axis deviation. Patient also noted to have systolic blood pressures in the 80s to low 90s. Heart rate noted to range from 73-110. Respiratory rate of 27. Lower extremity Dopplers negative for DVT. [facility] were called to admit the patient for further evaluation and management.  Review of Systems: As per HPI otherwise 10 point review of systems negative.   Past Medical History  Diagnosis Date  . Asthma   . Hypertension   . Gout   . Arthritis   . Insomnia   . Depression   . Aortic aneurysm without rupture (HCC)     3cm by CT 08/03/14  . Paroxysmal SVT (supraventricular tachycardia) (HCC)   . Cor pulmonale (HCC)   . PFO (patent foramen ovale)   . COPD (chronic obstructive pulmonary disease) (HCC)   . Shortness of breath dyspnea     on home O2 at 2 L  . Stroke [facility](HCC) 06/2014, 11/2014    still has difficulty getting out some words  . Thyroid disease   . Anxiety   . Chronic kidney disease     dialysis on T/Th/Sa  . Renal insufficiency     Past  Surgical History  Procedure Laterality Date  . Multiple tooth extractions    . Av fistula placement Left 01/28/2014    Procedure: ARTERIOVENOUS (AV) FISTULA CREATION;  Surgeon: Sherren Kerns, MD;  Location: [facility] OR;  Service: Vascular;  Laterality: Left;  . Tee without cardioversion N/A 06/23/2014    Procedure: TRANSESOPHAGEAL ECHOCARDIOGRAM (TEE);  Surgeon: Lewayne Bunting, MD;  Location: [facility] ENDOSCOPY;  Service: Cardiovascular;  Laterality: N/A;  . Insertion of dialysis catheter Right  06/27/2014    Procedure: INSERTION OF Right Internal Jugular DIATEK CATHETER;  Surgeon: Pryor Ochoa, MD;  Location: [facility] OR;  Service: Vascular;  Laterality: Right;  . Laparotomy N/A 07/01/2014    Procedure: EXPLORATORY LAPAROTOMY WITH CLOSURE OF PERFORATED PYLORIC ULCER;  Surgeon: Claud Kelp, MD;  Location: [facility] OR;  Service: General;  Laterality: N/A;  . Bowel resection N/A 07/01/2014    Procedure: SMALL BOWEL RESECTION;  Surgeon: Claud Kelp, MD;  Location[facility] OR;  Service: General;  Laterality: N/A;  . 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 Resection of arteriovenous fistula aneurysm Left 07/09/2015    Procedure: LEFT UPPER ARM  ARTERIOVENOUS FISTULA  ANEURYSMORRPHY;  Surgeon: Pryor Ochoa, MD;  Location: [facility] OR;  Service: Vascular;  Laterality: Left;     reports that she quit smoking about 10 months ago. Her smoking use included Cigarettes. She has a 10 pack-year smoking history. She has never used smokeless tobacco. She reports that she does not drink alcohol or use illicit drugs.  Allergies  Allergen Reactions  . Heparin Other (See Comments)    Heparin antibody positive; SRA negative    Family History  Problem Relation Age of Onset  . Diabetes Mother   . Hypertension Mother   . Heart disease Mother   . Heart attack Mother   . Peripheral vascular disease Mother   . Diabetes Father   . Heart disease Father   . Hypertension Father   . Diabetes Sister   . Hypertension Sister   . Heart disease Sister     before age 44  . Heart attack Sister   . Peripheral vascular disease Sister   . Heart disease Brother   . Hyperlipidemia Daughter    Family history reviewed and not pertinent   Prior to Admission medications   Medication Sig Start Date End Date Taking?  Authorizing Provider  albuterol (PROVENTIL HFA;VENTOLIN HFA) 108 (90 Base) MCG/ACT inhaler Inhale 2 puffs into the lungs every 6 (six) hours as needed for wheezing or shortness of breath. Reported on 07/06/2015 09/10/15  Yes Myrlene Broker, MD  albuterol (PROVENTIL) (2.5 MG/3ML) 0.083% nebulizer solution Take 3 mLs (2.5 mg total) by nebulization every 6 (six) hours as needed for wheezing or shortness of breath. 12/26/14  Yes Ranelle Oyster, MD  ALPRAZolam Prudy Feeler) 0.5 MG tablet Xanax 0.25 mg every 8 hours as needed and 0.5 mg every Tuesday Thursday Saturday with dialysis 09/10/15  Yes Myrlene Broker, MD  aspirin 325 MG tablet Take 1 tablet (325 mg total) by mouth daily. 12/24/14  Yes Daniel J Angiulli, PA-C  calcium acetate (PHOSLO) 667 MG capsule Take 667 mg by mouth 3 (three) times daily with meals.   Yes Historical Provider, MD  gabapentin (NEURONTIN) 100 MG capsule Take 1 capsule (100 mg total)  by mouth daily as needed (pain). 09/10/15  Yes Myrlene Broker, MD  HYDROcodone-acetaminophen (NORCO/VICODIN) 5-325 MG tablet Take 1 tablet by mouth every 6 (six) hours as needed. 09/17/15  Yes Tiffany Neva Seat, PA-C  ipratropium-albuterol (DUONEB) 0.5-2.5 (3) MG/3ML SOLN Take 3 mLs by nebulization 3 (three) times daily. 12/24/14  Yes Daniel J Angiulli, PA-C  levETIRAcetam (KEPPRA) 100 MG/ML solution Take 5 mLs (500 mg total) by mouth 2 (two) times daily. 12/24/14  Yes Daniel J Angiulli, PA-C  levothyroxine (SYNTHROID, LEVOTHROID) 25 MCG tablet Take 1 tablet (25 mcg total) by mouth daily before breakfast. 12/24/14  Yes Daniel J Angiulli, PA-C  loperamide (IMODIUM A-D) 2 MG tablet Take 2 mg by mouth 4 (four) times daily as needed for diarrhea or loose stools.   Yes Historical Provider, MD  metoprolol succinate (TOPROL-XL) 25 MG 24 hr tablet Take 1 tablet (25 mg total) by mouth daily. 05/25/15  Yes Myrlene Broker, MD  mirtazapine (REMERON) 15 MG tablet Place 1 tablet (15 mg total) into feeding tube at  bedtime. Patient taking differently: Take 15 mg by mouth at bedtime.  12/24/14  Yes Daniel J Angiulli, PA-C  multivitamin (RENA-VIT) TABS tablet Take 1 tablet by mouth daily.   Yes Historical Provider, MD  pantoprazole (PROTONIX) 40 MG tablet Take 40 mg by mouth daily. Reported on 05/25/2015   Yes Historical Provider, MD  zaleplon (SONATA) 5 MG capsule Take 1 capsule (5 mg total) by mouth at bedtime as needed for sleep. 09/10/15  Yes Myrlene Broker, MD    Physical Exam: Filed Vitals:   09/12/2015 1600 09/16/2015 1734 09/12/2015 1745 09/13/2015 1800  BP: 89/66 87/73 91/74  91/70  Pulse:  101 103   Temp:      TempSrc:      Resp: 32 28 28 27   SpO2:  92% 94%       Constitutional: NAD.Frail. Filed Vitals:   10/01/2015 1600 09/10/2015 1734 09/10/2015 1745 09/28/2015 1800  BP: 89/66 87/73 91/74  91/70  Pulse:  101 103   Temp:      TempSrc:      Resp: 32 28 28 27   SpO2:  92% 94%    Eyes: PERRLA, EOMI, lids and conjunctivae normal ENMT: Mucous membranes are dry. Posterior pharynx clear of any exudate or lesions.Normal dentition.  Neck: normal, supple, no masses, no thyromegaly Respiratory: Some coarse breath sounds in the bases otherwise no wheezing, no crackles noted. Normal respiratory effort. No accessory muscle use.  Cardiovascular: Tachycardic no murmurs / rubs / gallops. 2+ pedal pulses. No carotid bruits. Bilateral lower extremity with 2-3+ edema up to hips. Abdomen: no tenderness, no masses palpated. No hepatosplenomegaly. Bowel sounds positive.  Musculoskeletal: no clubbing / cyanosis. No joint deformity upper and lower extremities. Good ROM, no contractures. Normal muscle tone.  Skin: no rashes, lesions, ulcers. No induration Neurologic: Aphasic. CN 2-12 grossly intact. Sensation intact, Strength 4/5 in all 4.  Psychiatric: Patient with aphasia. Unable to assess judgment and insight. Alert. Normal mood.   Labs on Admission: I have personally reviewed following labs and imaging  studies  CBC:  Recent Labs Lab 09/13/15 1555 09/16/15 1903 09/02/2015 1620  WBC 6.6 10.5 14.9*  NEUTROABS 4.8 7.9* 12.4*  HGB 11.0* 11.9* 10.2*  HCT 34.4* 36.8 32.7*  MCV 84.1 84.8 84.3  PLT 187 214 194   Basic Metabolic Panel:  Recent Labs Lab 09/13/15 1555 09/16/15 1903 09/27/2015 1454  NA 131* 133* 133*  K 4.8 4.4 4.8  CL 94* 95*  93*  CO2 18* 24 26  GLUCOSE 33* 55* 76  BUN 60* 20 17  CREATININE 10.02* 5.29* 4.59*  CALCIUM 8.6* 9.2 9.4   GFR: Estimated Creatinine Clearance: 14.2 mL/min (by C-G formula based on Cr of 4.59). Liver Function Tests:  Recent Labs Lab 09/16/15 1903 Oct 12, 2015 1454  AST 47* 119*  ALT 25 60*  ALKPHOS 90 103  BILITOT 1.3* 0.9  PROT 8.3* 8.3*  ALBUMIN 3.1* 3.1*   No results for input(s): LIPASE, AMYLASE in the last 168 hours. No results for input(s): AMMONIA in the last 168 hours. Coagulation Profile: No results for input(s): INR, PROTIME in the last 168 hours. Cardiac Enzymes:  Recent Labs Lab 10/12/2015 1454 10-12-2015 1620  CKTOTAL  --  74  TROPONINI 0.83*  --    BNP (last 3 results) No results for input(s): PROBNP in the last 8760 hours. HbA1C: No results for input(s): HGBA1C in the last 72 hours. CBG:  Recent Labs Lab 09/16/15 2155 09/16/15 2228 09/16/15 2358 09/17/15 0144 2015-10-12 1603  GLUCAP 80 97 129* 82 57*   Lipid Profile: No results for input(s): CHOL, HDL, LDLCALC, TRIG, CHOLHDL, LDLDIRECT in the last 72 hours. Thyroid Function Tests: No results for input(s): TSH, T4TOTAL, FREET4, T3FREE, THYROIDAB in the last 72 hours. Anemia Panel: No results for input(s): VITAMINB12, FOLATE, FERRITIN, TIBC, IRON, RETICCTPCT in the last 72 hours. Urine analysis:    Component Value Date/Time   COLORURINE YELLOW 02/23/2015 1454   APPEARANCEUR HAZY* 02/23/2015 1454   LABSPEC 1.006 02/23/2015 1454   PHURINE 8.5* 02/23/2015 1454   GLUCOSEU NEGATIVE 02/23/2015 1454   HGBUR MODERATE* 02/23/2015 1454   BILIRUBINUR NEGATIVE  02/23/2015 1454   KETONESUR NEGATIVE 02/23/2015 1454   PROTEINUR 100* 02/23/2015 1454   UROBILINOGEN 1.0 11/10/2014 1336   NITRITE NEGATIVE 02/23/2015 1454   LEUKOCYTESUR TRACE* 02/23/2015 1454   Sepsis Labs: !!!!!!!!!!!!!!!!!!!!!!!!!!!!!!!!!!!!!!!!!!!! @LABRCNTIP (procalcitonin:4,lacticidven:4) )No results found for this or any previous visit (from the past 240 hour(s)).   Radiological Exams on Admission: Dg Chest 2 View  10/12/2015  CLINICAL DATA:  Shortness of breath, chest pain. EXAM: CHEST  2 VIEW COMPARISON:  Radiographs of [place] 12, 2017. FINDINGS: Stable cardiomegaly. Minimal bilateral pleural effusions are noted. No pneumothorax is noted. No acute pulmonary disease is noted. Bony thorax is unremarkable. IMPRESSION: Stable cardiomegaly. Minimal bilateral pleural effusions. No other abnormality seen in the chest. Electronically Signed   By: Lupita Raider, M.D.   On: Oct 12, 2015 15:55    EKG: Independently reviewed. Sinus tachycardia with right axis deviation  Assessment/Plan Principal Problem:   Hypotension Active Problems:   Hypoxia   Elevated troponin   Hypertension   ESRD needing dialysis (HCC)   Depression   Chronic diastolic heart failure (HCC)   Tobacco use disorder   Hyperlipidemia   Stroke (HCC)   Protein calorie malnutrition (HCC)   Paroxysmal a-fib (HCC)   HLD (hyperlipidemia)   Left middle cerebral artery stroke (HCC)   Aphasia due to stroke   ESRD on dialysis (HCC)   Volume overload   Leukocytosis   Anemia    #1 hypotension Questionable etiology. Patient noted to be hypotensive on admission with systolic blood pressures in the 80s. Patient also end-stage renal disease on hemodialysis. Patient also noted to have bilateral lower extremity edema to the hips. Patient noted to have a leukocytosis with a white count of 14.9. Patient also noted to have a slightly elevated troponin level with a BNP of graded of 4500. Differential includes sepsis versus  cardiac  etiology versus PE as patient noted to be sedentary. Chest x-ray obtained was unremarkable. Lower extremity Dopplers were negative for DVT. Will admit patient to the step down unit. Will panculture patient. Check a lactic acid. Check a pro-calcitonin. Cycle cardiac enzymes every 6 hours 3. Check a CT angiogram chest. Placed empirically on IV vancomycin and IV Zosyn. We'll give a normal saline bolus of 2 50 mL 1. Systolic blood pressures less than 90 or give boluses of 250 mL. Consult with critical care medicine for further evaluation and management.  #2 chronic diastolic heart failure/right-sided heart failure Patient with bilateral lower extremity edema up to her hips. Chest x-ray done was negative for any acute infiltrate. Lower extremity Dopplers negative. Patient with severe pulmonary hypertension from prior 2-D echo from 11/12/2014 with a EF of 60-65% no wall motion abnormalities severely dilated right atrium, mildly reduced right ventricular systolic function, pulmonary artery systolic pressure severely increased with a PA peak pressure of 86 mmHg. Patient with hypotension and a such can diuresed at this time. Patient also with end-stage renal disease on hemodialysis. Consult with cardiology for further evaluation and management.  #3 elevated troponin Patient noted to have elevated troponin on admission with hypotension and shortness of breath. Patient denies any chest pain. EKG with no ischemic changes. Cycle cardiac enzymes every 6 hours 3. Check a 2-D echo. Cardiology has been consulted per ED physician. Continue home regimen of aspirin. Beta blocker on hold secondary to hypotension. Follow.  #4 end-stage renal disease on hemodialysis Nephrology has been consulted and will be seen the patient.  #5 transaminitis Likely secondary to hepatic congestion. Follow.  #6 leukocytosis Questionable etiology. Check a UA with cultures and sensitivities. Check blood cultures 2. Chest x-ray negative for  any acute infiltrate. Check a lactic acid level. Check a pro calcitonin level. Place empirically on IV vancomycin IV Zosyn. Follow.  #7 hypoxia Patient presenting with shortness of breath. ABG obtained had a pH of 7.34 PCO2 of 49 PO2 of 49 with O2 sats of 81. Patient with a history of severe pulmonary hypertension. Patient with hypotension and noted to have right-sided heart failure likely chronic. Lower extremity Dopplers negative for DVT. Will place on oxygen. Check a CT angiogram chest rule out PE. Consult with critical care medicine.  #8 protein calorie malnutrition  #9 of SVT versus history of A. fib Hold beta blocker for now secondary to hypotension. Metoprolol IV when necessary heart rate greater than 120. Cardiology consultation pending.  #10 history of CVA Stable. Continue aspirin for secondary stroke prevention. Continue Keppra for seizure prophylaxis.  #11 hypothyroidism Check a TSH. Continue home dose Synthroid.  DVT prophylaxis: SCDs Code Status: Full Family Communication: Updated patient and daughter at bedside. Disposition Plan: Admit to stepdown unit. Consults called: Cardiology per ED physician has been contacted for consult ED physician cannot remember who he spoke with/nephrology/critical care Admission status: Admit to stepdown unit.   [facility] MD [facility] Pager [phone]  If 7PM-7AM, please contact night-coverage [email or web address] Password [facility]  2015/09/28, 6:33 PM

## 2015-09-18 NOTE — ED Notes (Signed)
Patient transported to CT 

## 2015-09-18 NOTE — Progress Notes (Signed)
Pharmacy Antibiotic Note  [name] is a 60 y.o. female admitted on 09/25/2015 with sepsis.  Pharmacy has been consulted for vancomycin and zosyn dosing. Pt with history of ESRD on HD TTS presents with leg pain. Her last dialysis session was 6/16.  Pt received vancomycin 1500mg  and zosyn 3.375g IV once in the ED.  Plan: Vancomycin 750mg  IV qHD Zosyn 2.25g IV q8h Monitor culture data, HD plans and clinical course VT at SS prn     Temp (24hrs), Avg:98.7 F (37.1 C), Min:98.7 F (37.1 C), Max:98.7 F (37.1 C)   Recent Labs Lab 09/13/15 1555 09/16/15 1903 09/17/2015 1454 09/11/2015 1620  WBC 6.6 10.5  --  14.9*  CREATININE 10.02* 5.29* 4.59*  --     Estimated Creatinine Clearance: 14.2 mL/min (by C-G formula based on Cr of 4.59).    Allergies  Allergen Reactions  . Heparin Other (See Comments)    Heparin antibody positive; SRA negative    Antimicrobials this admission: Vanc 6/17 >>  Zosyn 6/17 >>   Dose adjustments this admission: n/a  Microbiology results:  BCx:   UCx:    Sputum:    MRSA PCR:   Arlean Hoppingorey M. Newman PiesBall, PharmD, BCPS Clinical Pharmacist Pager [phone] 09/11/2015 6:13 PM

## 2015-09-18 NOTE — ED Notes (Signed)
Called to give report, bed is not ready and they stated they may have to change room.

## 2015-09-18 NOTE — ED Notes (Signed)
Patient transported to Ultrasound 

## 2015-09-19 ENCOUNTER — Inpatient Hospital Stay (HOSPITAL_COMMUNITY): Payer: Medicare Other

## 2015-09-19 ENCOUNTER — Other Ambulatory Visit: Payer: Self-pay

## 2015-09-19 DIAGNOSIS — R570 Cardiogenic shock: Secondary | ICD-10-CM | POA: Insufficient documentation

## 2015-09-19 DIAGNOSIS — Z515 Encounter for palliative care: Secondary | ICD-10-CM | POA: Insufficient documentation

## 2015-09-19 DIAGNOSIS — I272 Other secondary pulmonary hypertension: Secondary | ICD-10-CM

## 2015-09-19 DIAGNOSIS — I5033 Acute on chronic diastolic (congestive) heart failure: Secondary | ICD-10-CM | POA: Insufficient documentation

## 2015-09-19 DIAGNOSIS — J810 Acute pulmonary edema: Secondary | ICD-10-CM

## 2015-09-19 DIAGNOSIS — I2721 Secondary pulmonary arterial hypertension: Secondary | ICD-10-CM | POA: Insufficient documentation

## 2015-09-19 DIAGNOSIS — N049 Nephrotic syndrome with unspecified morphologic changes: Secondary | ICD-10-CM | POA: Insufficient documentation

## 2015-09-19 DIAGNOSIS — R0902 Hypoxemia: Secondary | ICD-10-CM | POA: Insufficient documentation

## 2015-09-19 DIAGNOSIS — N186 End stage renal disease: Secondary | ICD-10-CM

## 2015-09-19 DIAGNOSIS — I509 Heart failure, unspecified: Secondary | ICD-10-CM

## 2015-09-19 DIAGNOSIS — I9589 Other hypotension: Secondary | ICD-10-CM

## 2015-09-19 DIAGNOSIS — Z7189 Other specified counseling: Secondary | ICD-10-CM

## 2015-09-19 DIAGNOSIS — J45909 Unspecified asthma, uncomplicated: Secondary | ICD-10-CM | POA: Insufficient documentation

## 2015-09-19 DIAGNOSIS — I2609 Other pulmonary embolism with acute cor pulmonale: Secondary | ICD-10-CM | POA: Insufficient documentation

## 2015-09-19 DIAGNOSIS — L8990 Pressure ulcer of unspecified site, unspecified stage: Secondary | ICD-10-CM | POA: Insufficient documentation

## 2015-09-19 DIAGNOSIS — F329 Major depressive disorder, single episode, unspecified: Secondary | ICD-10-CM

## 2015-09-19 LAB — TSH: TSH: 5.094 u[IU]/mL — AB (ref 0.350–4.500)

## 2015-09-19 LAB — ECHOCARDIOGRAM COMPLETE
CHL CUP DOP CALC LVOT VTI: 12.9 cm
CHL CUP STROKE VOLUME: 29 mL
E/e' ratio: 3.51
EWDT: 176 ms
FS: 26 % — AB (ref 28–44)
HEIGHTINCHES: 67 in
IV/PV OW: 0.83
LA ID, A-P, ES: 38 mm
LA diam end sys: 38 mm
LA diam index: 1.98 cm/m2
LA vol A4C: 85.2 ml
LAVOL: 90.1 mL
LAVOLIN: 46.9 mL/m2
LV E/e' medial: 3.51
LV E/e'average: 3.51
LV PW d: 16.4 mm — AB (ref 0.6–1.1)
LV dias vol index: 37 mL/m2
LV e' LATERAL: 14.4 cm/s
LV sys vol: 42 mL (ref 14–42)
LVDIAVOL: 70 mL (ref 46–106)
LVOT SV: 45 mL
LVOT area: 3.46 cm2
LVOTD: 21 mm
LVOTPV: 88.9 cm/s
LVSYSVOLIN: 22 mL/m2
MV Dec: 176
MV pk A vel: 28.7 m/s
MV pk E vel: 50.6 m/s
Reg peak vel: 244 cm/s
Simpson's disk: 41
TDI e' lateral: 14.4
TDI e' medial: 8.7
TR max vel: 244 cm/s
WEIGHTICAEL: 2716.07 [oz_av]

## 2015-09-19 LAB — TROPONIN I
Troponin I: 0.63 ng/mL (ref <0.031)
Troponin I: 0.68 ng/mL (ref <0.031)

## 2015-09-19 LAB — CBC
HCT: 32.6 % — ABNORMAL LOW (ref 36.0–46.0)
HEMOGLOBIN: 10.3 g/dL — AB (ref 12.0–15.0)
MCH: 26.8 pg (ref 26.0–34.0)
MCHC: 31.6 g/dL (ref 30.0–36.0)
MCV: 84.7 fL (ref 78.0–100.0)
Platelets: 166 10*3/uL (ref 150–400)
RBC: 3.85 MIL/uL — AB (ref 3.87–5.11)
RDW: 18.5 % — ABNORMAL HIGH (ref 11.5–15.5)
WBC: 11 10*3/uL — ABNORMAL HIGH (ref 4.0–10.5)

## 2015-09-19 LAB — PROTIME-INR
INR: 1.56 — AB (ref 0.00–1.49)
PROTHROMBIN TIME: 18.8 s — AB (ref 11.6–15.2)

## 2015-09-19 LAB — COMPREHENSIVE METABOLIC PANEL
ALK PHOS: 78 U/L (ref 38–126)
ALT: 43 U/L (ref 14–54)
ANION GAP: 12 (ref 5–15)
AST: 65 U/L — ABNORMAL HIGH (ref 15–41)
Albumin: 2.3 g/dL — ABNORMAL LOW (ref 3.5–5.0)
BUN: 21 mg/dL — ABNORMAL HIGH (ref 6–20)
CALCIUM: 9.1 mg/dL (ref 8.9–10.3)
CO2: 24 mmol/L (ref 22–32)
Chloride: 96 mmol/L — ABNORMAL LOW (ref 101–111)
Creatinine, Ser: 5.22 mg/dL — ABNORMAL HIGH (ref 0.44–1.00)
GFR calc non Af Amer: 8 mL/min — ABNORMAL LOW (ref >60)
GFR, EST AFRICAN AMERICAN: 9 mL/min — AB (ref >60)
Glucose, Bld: 114 mg/dL — ABNORMAL HIGH (ref 65–99)
POTASSIUM: 4.7 mmol/L (ref 3.5–5.1)
Sodium: 132 mmol/L — ABNORMAL LOW (ref 135–145)
Total Bilirubin: 0.8 mg/dL (ref 0.3–1.2)
Total Protein: 6.5 g/dL (ref 6.5–8.1)

## 2015-09-19 LAB — LACTIC ACID, PLASMA
LACTIC ACID, VENOUS: 1.7 mmol/L (ref 0.5–2.0)
LACTIC ACID, VENOUS: 3.1 mmol/L — AB (ref 0.5–2.0)
Lactic Acid, Venous: 1.8 mmol/L (ref 0.5–2.0)
Lactic Acid, Venous: 0.9 mmol/L (ref 0.5–2.0)

## 2015-09-19 LAB — AMMONIA: AMMONIA: 65 umol/L — AB (ref 9–35)

## 2015-09-19 LAB — GLUCOSE, CAPILLARY: Glucose-Capillary: 80 mg/dL (ref 65–99)

## 2015-09-19 LAB — BRAIN NATRIURETIC PEPTIDE: B NATRIURETIC PEPTIDE 5: 4125.6 pg/mL — AB (ref 0.0–100.0)

## 2015-09-19 LAB — APTT: aPTT: 38 seconds — ABNORMAL HIGH (ref 24–37)

## 2015-09-19 LAB — CORTISOL: Cortisol, Plasma: 14.4 ug/dL

## 2015-09-19 LAB — MRSA PCR SCREENING: MRSA by PCR: NEGATIVE

## 2015-09-19 MED ORDER — PRISMASOL BGK 4/2.5 32-4-2.5 MEQ/L IV SOLN
INTRAVENOUS | Status: DC
  Administered 2015-09-19 – 2015-09-22 (×18): via INTRAVENOUS_CENTRAL
  Filled 2015-09-19 (×31): qty 5000

## 2015-09-19 MED ORDER — PIPERACILLIN-TAZOBACTAM 3.375 G IVPB 30 MIN
3.3750 g | Freq: Four times a day (QID) | INTRAVENOUS | Status: DC
  Administered 2015-09-19 – 2015-09-21 (×6): 3.375 g via INTRAVENOUS
  Filled 2015-09-19 (×9): qty 50

## 2015-09-19 MED ORDER — LEVALBUTEROL HCL 0.63 MG/3ML IN NEBU
0.6300 mg | INHALATION_SOLUTION | Freq: Three times a day (TID) | RESPIRATORY_TRACT | Status: DC
  Administered 2015-09-19 – 2015-09-22 (×10): 0.63 mg via RESPIRATORY_TRACT
  Filled 2015-09-19 (×10): qty 3

## 2015-09-19 MED ORDER — HEPARIN SODIUM (PORCINE) 1000 UNIT/ML DIALYSIS: 1000.0000 [IU] | INTRAMUSCULAR | Status: DC | PRN

## 2015-09-19 MED ORDER — PRISMASOL BGK 4/2.5 32-4-2.5 MEQ/L IV SOLN
INTRAVENOUS | Status: DC
  Administered 2015-09-19 – 2015-09-22 (×5): via INTRAVENOUS_CENTRAL
  Filled 2015-09-19 (×7): qty 5000

## 2015-09-19 MED ORDER — PRISMASOL BGK 4/2.5 32-4-2.5 MEQ/L IV SOLN
INTRAVENOUS | Status: DC
Start: 2015-09-19 — End: 2015-09-22
  Administered 2015-09-19 – 2015-09-21 (×4): via INTRAVENOUS_CENTRAL
  Filled 2015-09-19 (×4): qty 5000

## 2015-09-19 MED ORDER — MILRINONE LACTATE IN DEXTROSE 20-5 MG/100ML-% IV SOLN
0.1250 ug/kg/min | INTRAVENOUS | Status: DC
  Administered 2015-09-19: 0.125 ug/kg/min via INTRAVENOUS
  Filled 2015-09-19: qty 100

## 2015-09-19 MED ORDER — NOREPINEPHRINE BITARTRATE 1 MG/ML IV SOLN
2.0000 ug/min | INTRAVENOUS | Status: DC
  Administered 2015-09-19: 10 ug/min via INTRAVENOUS
  Filled 2015-09-19 (×2): qty 4

## 2015-09-19 MED ORDER — ANTICOAGULANT SODIUM CITRATE 4% (200MG/5ML) IV SOLN
5.0000 mL | Freq: Four times a day (QID) | Status: DC | PRN
  Administered 2015-09-19: 2.4 mL via INTRAVENOUS
  Filled 2015-09-19 (×2): qty 250

## 2015-09-19 MED ORDER — SODIUM CHLORIDE 0.9 % IV BOLUS (SEPSIS)
500.0000 mL | Freq: Once | INTRAVENOUS | Status: AC
  Administered 2015-09-19: 500 mL via INTRAVENOUS

## 2015-09-19 MED ORDER — IPRATROPIUM BROMIDE 0.02 % IN SOLN
0.5000 mg | Freq: Three times a day (TID) | RESPIRATORY_TRACT | Status: DC
Start: 2015-09-19 — End: 2015-09-22
  Administered 2015-09-19 – 2015-09-22 (×10): 0.5 mg via RESPIRATORY_TRACT
  Filled 2015-09-19 (×10): qty 2.5

## 2015-09-19 MED ORDER — HEPARIN (PORCINE) 2000 UNITS/L FOR CRRT: INTRAVENOUS_CENTRAL | Status: DC | PRN

## 2015-09-19 MED ORDER — MORPHINE SULFATE (CONCENTRATE) 10 MG/0.5ML PO SOLN: 5.0000 mg | ORAL | Status: DC | PRN

## 2015-09-19 MED ORDER — ANTICOAGULANT SODIUM CITRATE 4% (200MG/5ML) IV SOLN
5.0000 mL | Status: DC | PRN
  Administered 2015-09-22: 2.4 mL via INTRAVENOUS_CENTRAL
  Filled 2015-09-19 (×3): qty 250

## 2015-09-19 MED ORDER — VANCOMYCIN HCL IN DEXTROSE 750-5 MG/150ML-% IV SOLN
750.0000 mg | INTRAVENOUS | Status: DC
  Filled 2015-09-19: qty 150

## 2015-09-19 MED ORDER — LACTULOSE 10 GM/15ML PO SOLN
10.0000 g | Freq: Every day | ORAL | Status: DC
  Administered 2015-09-20 – 2015-09-22 (×3): 10 g via ORAL
  Filled 2015-09-19 (×3): qty 15

## 2015-09-19 MED ORDER — SODIUM CHLORIDE 0.9 % FOR CRRT
INTRAVENOUS_CENTRAL | Status: DC | PRN
  Filled 2015-09-19: qty 1000

## 2015-09-19 MED ORDER — ALTEPLASE 2 MG IJ SOLR: 2.0000 mg | Freq: Once | INTRAMUSCULAR | Status: DC | PRN

## 2015-09-19 MED ORDER — DOBUTAMINE IN D5W 4-5 MG/ML-% IV SOLN
2.5000 ug/kg/min | INTRAVENOUS | Status: DC
  Administered 2015-09-19: 5 ug/kg/min via INTRAVENOUS
  Filled 2015-09-19: qty 250

## 2015-09-19 NOTE — Progress Notes (Signed)
CRITICAL VALUE ALERT  Critical value received:  Lactic acid 3.1  Date of notification:  09/19/2015   Time of notification:  0142  Critical value read back: yes  Nurse who received alert:  Birdena CrandallAmanda Levia Waltermire RN  MD notified (1st page):  Lenny Pastelom Callahan  Time of first page:  0144  MD notified (2nd page):  Time of second page:  Responding MD:    Time MD responded:  [phone]

## 2015-09-19 NOTE — Progress Notes (Addendum)
  Asked by Dr. Malachi BondsShort to see patient for worsening RHF and hypotension.   60 yo F with h/o ESRD on HD, hypertension, COPD on 2L home O2, severe pulmonary hypertension (previous PA pressures 80-90 range), atrial fibrillation and tachycardia, and CVA who was admitted yesterday with worsening RHF.   Currently somnolent but awake. Unable to provide history due to aphasia. SBP in 80s.   JVP to jaw Cor: RRR with very massive RV heave and loud R-sided s3. 2/6 TR Lungs: Decreased BS throughout Ab: + hernia. Mildly distended. Marked hepatomegaly with pulsatile liver Ext: warm: 1-2+ edema  Echo from today reviewed personally. LVEF normal. RV markedly dilated. Moderately HK. Wide open TR with marked flattening of interventricular septum. PA pressures only measure in 40s but suspect underestimated due to wide open TR  A/P: She has [facility] with hypotension in setting of multiple medical comorbidities. CT scan negative for acute PE. Given comorbidities, she is not a candidate for IV prostanoids. Despite ESRD will start low-dose milrinone and inhaled NO and see how she responds. If tolerates can also consider low dose PDE-5 inhibitor (revatio).  If not responding could consider moving to ICU placing Swan and supporting with norepi and CVVHD but I am not sure this will change our outcome.  Long talk with daughter about situation. She continues to want aggressive care. We also discussed code status as she would not fare well with cardiac or respiratory arrest. She will discuss with her family.   The patient is critically ill with multiple organ systems failure and requires high complexity decision making for assessment and support, frequent evaluation and titration of therapies, application of advanced monitoring technologies and extensive interpretation of multiple databases.   Critical Care Time devoted to patient care services described in this note is 35 Minutes.   Arayna Illescas,  Wynne Rozak,MD 1:25 PM

## 2015-09-19 NOTE — Progress Notes (Signed)
Subjective:  Currently no complaints and had a recently stable night.  CT angina did not show pulmonary embolus but showed evidence of right heart failure.  Objective:  Vital Signs in the last 24 hours: BP 100/68 mmHg  Pulse 94  Temp(Src) 97.5 F (36.4 C) (Oral)  Resp 22  Ht 5\' 7"  (1.702 m)  Wt 77 kg (169 lb 12.1 oz)  BMI 26.58 kg/m2  SpO2 92%  Physical Exam: Lethargic appearing BF lying in bed Lungs:  Reduced breath sounds Cardiac:  Regular rhythm, normal S1 and S2, no S3 Extremities:  1+ edema present  Intake/Output from previous day: 06/17 0701 - 06/18 0700 In: 1040 [P.O.:240; IV Piggyback:800] Out: 0   Weight Filed Weights   09/02/2015 2100  Weight: 77 kg (169 lb 12.1 oz)    Lab Results: Basic Metabolic Panel:  Recent Labs  16/01/9605/24/2017 1454 09/19/15 0049  NA 133* 132*  K 4.8 4.7  CL 93* 96*  CO2 26 24  GLUCOSE 76 114*  BUN 17 21*  CREATININE 4.59* 5.22*   CBC:  Recent Labs  09/16/15 1903 09/27/2015 1620 09/19/15 0049  WBC 10.5 14.9* 11.0*  NEUTROABS 7.9* 12.4*  --   HGB 11.9* 10.2* 10.3*  HCT 36.8 32.7* 32.6*  MCV 84.8 84.3 84.7  PLT 214 194 166   Cardiac Panel (last 3 results)  Recent Labs  09/27/2015 1620 09/19/2015 2206 09/19/15 0049 09/19/15 0846  CKTOTAL 74  --   --   --   TROPONINI  --  0.60* 0.63* 0.68*    Telemetry: Sinus rhythm, had some nonsustained runs of atrial tachycardia and fibrillation last night and earlier today  Assessment/Plan:  1.  Prominent right heart failure with dilated poorly functioning right ventricle pretty normal LV function 2.  Paroxysmal atrial fibrillation 3.  Obesity 4.  End-stage renal disease 5.  History of GI bleeding  Recommendations:  Her CT angiogram did not show evidence of a pulmonary embolus.  I think this is predominantly right heart failure.  Attempts take fluid off may result in hypotension that will be problematic with dialysis.  Try to keep volume reasonable and treat underlying pulmonary  disease.      Darden PalmerW. Spencer Luella Gardenhire, Jr.  MD [facility]ACC Cardiology  09/19/2015, 11:56 AM

## 2015-09-19 NOTE — Consult Note (Signed)
Consultation Note Date: 09/19/2015   Patient Name: [name]  DOB: 1955-05-12  MRN: [id number]  Age / Sex: 60 y.o., female  PCP: Myrlene Broker, MD Referring Physician: Renae Fickle, MD  Reason for Consultation: Establishing goals of care  HPI/Patient Profile: 60 y.o. female   admitted on 09/29/2015   Life limiting illness: worsening RHF and hypotension. Clinical Assessment and Goals of Care:      60 yo F with h/o ESRD on HD, hypertension, COPD on 2L home O2, severe pulmonary hypertension (previous PA pressures 80-90 range), atrial fibrillation and tachycardia, and CVA who was admitted yesterday with worsening RHF. She is being followed by hospital medicine, nephrology and cardiology.     Echo Has been read as having normal left ventricular ejection fraction but markedly dilated right ventricle, severe tricuspid regurgitation. Elevated pulmonary artery pressures. The patient has been hypotensive today. She has been seen and evaluated by cardiology, nephrology has been following. Her life limiting illnesses and states right heart failure due to pulmonary artery hypertension, she has other underlying medical comorbidities. She had a stroke last year. She is being started on low-dose milrinone as well as inhaled nitric oxide after cardiology input. CODE STATUS and goals of care discussions were undertaken by hospital medicine team, cardiology team with the patient's daughter. At this time, she elects full code and continuation of critical, aggressive measures. Palliative care also consulted for additional supportive care and goals of care discussions  Patient is a weak, chronically ill appearing lady resting in bed. She opens her eyes but does not say much. She appears weak. Daughter and sister who is also a Education officer, environmental both present at the bedside. I introduced myself and introduced palliative medicine as  follows:    Palliative medicine is specialized medical care for people living with serious illness. It focuses on providing relief from the symptoms and stress of a serious illness. The goal is to improve quality of life for both the patient and the family.  Patient's daughter and sister state that they would like to continue with full code, continue with any and all life maintaining/life-prolonging measures available. They endorse strong faith, they endorse strong belief in miracles. They state that the patient was very seriously ill from complications of a stroke when she was hospitalized in August 2016. They recall having serious conversations with various healthcare providers outlining how the patient has a high risk of dying. They're happy that she made it out of the hospital at that time in August 2016. Discussed very gently about how the patient is even further declined from last year. Discussed about high risk of dying with or without aggressive critical measures. Family states that there are at peace if the patient dies in the hospital, they state they know where she is going-to have an. However, if her heart stops or if she stops breathing, they would like for her to go through a resuscitative attempt that entails CPR, intubation and mechanical ventilation etc.     HCPOA  daughter Brien Mates  SUMMARY OF RECOMMENDATIONS    full code Continue current mode of care Daughter and sister (who is also a Education officer, environmental) endorse having faith that patient will recover from this current illness Palliative will continue to follow along, help guide decision making and appropriate disposition planning.   Code Status/Advance Care Planning:  Full code    Symptom Management:    continue current mode of care.   Palliative Prophylaxis:   Bowel Regimen  Additional Recommendations (Limitations, Scope, Preferences):  Full Scope Treatment  Psycho-social/Spiritual:   Desire for further Chaplaincy  support:no  Additional Recommendations: Caregiving  Support/Resources  Prognosis:   Unable to determine but appears guarded.   Discharge Planning: To Be Determined      Primary Diagnoses: Present on Admission:  . Volume overload . Elevated troponin . Hypotension . Hypoxia . Hypertension . ESRD needing dialysis (HCC) . Depression . Chronic diastolic heart failure (HCC) . Tobacco use disorder . Hyperlipidemia . Protein calorie malnutrition (HCC) . Paroxysmal a-fib (HCC) . HLD (hyperlipidemia) . Leukocytosis . Anemia . Stroke (HCC) . Left middle cerebral artery stroke (HCC)  I have reviewed the medical record, interviewed the patient and family, and examined the patient. The following aspects are pertinent.  Past Medical History  Diagnosis Date  . Asthma   . Hypertension   . Gout   . Arthritis   . Insomnia   . Depression   . Aortic aneurysm without rupture (HCC)     3cm by CT 08/03/14  . Paroxysmal SVT (supraventricular tachycardia) (HCC)   . Cor pulmonale (HCC)   . PFO (patent foramen ovale)   . COPD (chronic obstructive pulmonary disease) (HCC)   . Shortness of breath dyspnea     on home O2 at 2 L  . Stroke [facility]) 06/2014, 11/2014    still has difficulty getting out some words  . Thyroid disease   . Anxiety   . Chronic kidney disease     dialysis on T/Th/Sa  . Renal insufficiency    Social History   Social History  . Marital Status: Single    Spouse Name: N/A  . Number of Children: N/A  . Years of Education: N/A   Social History Main Topics  . Smoking status: Former Smoker -- 0.50 packs/day for 20 years    Types: Cigarettes    Quit date: 11/05/2014  . Smokeless tobacco: Never Used  . Alcohol Use: No     Comment: occ  . Drug Use: No  . Sexual Activity: Not Asked   Other Topics Concern  . None   Social History Narrative   Family History  Problem Relation Age of Onset  . Diabetes Mother   . Hypertension Mother   . Heart disease Mother   .  Heart attack Mother   . Peripheral vascular disease Mother   . Diabetes Father   . Heart disease Father   . Hypertension Father   . Diabetes Sister   . Hypertension Sister   . Heart disease Sister     before age 11  . Heart attack Sister   . Peripheral vascular disease Sister   . Heart disease Brother   . Hyperlipidemia Daughter    Scheduled Meds: . aspirin  325 mg Oral Daily  . budesonide (PULMICORT) nebulizer solution  0.25 mg Nebulization BID  . calcium acetate  667 mg Oral TID WC  . guaiFENesin  1,200 mg Oral BID  . ipratropium  0.5 mg Nebulization TID  . levalbuterol  0.63 mg Nebulization  TID  . levETIRAcetam  500 mg Oral BID  . levothyroxine  25 mcg Oral QAC breakfast  . multivitamin  1 tablet Oral QHS  . pantoprazole  40 mg Oral Daily  . piperacillin-tazobactam (ZOSYN)  IV  2.25 g Intravenous Q8H  . sodium chloride flush  3 mL Intravenous Q12H  . sodium chloride flush  3 mL Intravenous Q12H  . [START ON 09/21/2015] vancomycin  750 mg Intravenous Q T,Th,Sa-HD   Continuous Infusions: . milrinone 0.125 mcg/kg/min (09/19/15 1438)   PRN Meds:.sodium chloride, ALPRAZolam, ipratropium, levalbuterol, loperamide, morphine CONCENTRATE, ondansetron **OR** ondansetron (ZOFRAN) IV, sodium chloride flush, zolpidem Medications Prior to Admission:  Prior to Admission medications   Medication Sig Start Date End Date Taking? Authorizing Provider  albuterol (PROVENTIL HFA;VENTOLIN HFA) 108 (90 Base) MCG/ACT inhaler Inhale 2 puffs into the lungs every 6 (six) hours as needed for wheezing or shortness of breath. Reported on 07/06/2015 09/10/15  Yes Myrlene Broker, MD  albuterol (PROVENTIL) (2.5 MG/3ML) 0.083% nebulizer solution Take 3 mLs (2.5 mg total) by nebulization every 6 (six) hours as needed for wheezing or shortness of breath. 12/26/14  Yes Ranelle Oyster, MD  ALPRAZolam Prudy Feeler) 0.5 MG tablet Xanax 0.25 mg every 8 hours as needed and 0.5 mg every Tuesday Thursday Saturday with  dialysis 09/10/15  Yes Myrlene Broker, MD  aspirin 325 MG tablet Take 1 tablet (325 mg total) by mouth daily. 12/24/14  Yes Daniel J Angiulli, PA-C  calcium acetate (PHOSLO) 667 MG capsule Take 667 mg by mouth 3 (three) times daily with meals.   Yes Historical Provider, MD  gabapentin (NEURONTIN) 100 MG capsule Take 1 capsule (100 mg total) by mouth daily as needed (pain). 09/10/15  Yes Myrlene Broker, MD  HYDROcodone-acetaminophen (NORCO/VICODIN) 5-325 MG tablet Take 1 tablet by mouth every 6 (six) hours as needed. 09/17/15  Yes Tiffany Neva Seat, PA-C  ipratropium-albuterol (DUONEB) 0.5-2.5 (3) MG/3ML SOLN Take 3 mLs by nebulization 3 (three) times daily. 12/24/14  Yes Daniel J Angiulli, PA-C  levETIRAcetam (KEPPRA) 100 MG/ML solution Take 5 mLs (500 mg total) by mouth 2 (two) times daily. 12/24/14  Yes Daniel J Angiulli, PA-C  levothyroxine (SYNTHROID, LEVOTHROID) 25 MCG tablet Take 1 tablet (25 mcg total) by mouth daily before breakfast. 12/24/14  Yes Daniel J Angiulli, PA-C  loperamide (IMODIUM A-D) 2 MG tablet Take 2 mg by mouth 4 (four) times daily as needed for diarrhea or loose stools.   Yes Historical Provider, MD  metoprolol succinate (TOPROL-XL) 25 MG 24 hr tablet Take 1 tablet (25 mg total) by mouth daily. 05/25/15  Yes Myrlene Broker, MD  mirtazapine (REMERON) 15 MG tablet Place 1 tablet (15 mg total) into feeding tube at bedtime. Patient taking differently: Take 15 mg by mouth at bedtime.  12/24/14  Yes Daniel J Angiulli, PA-C  multivitamin (RENA-VIT) TABS tablet Take 1 tablet by mouth daily.   Yes Historical Provider, MD  pantoprazole (PROTONIX) 40 MG tablet Take 40 mg by mouth daily. Reported on 05/25/2015   Yes Historical Provider, MD  zaleplon (SONATA) 5 MG capsule Take 1 capsule (5 mg total) by mouth at bedtime as needed for sleep. 09/10/15  Yes Myrlene Broker, MD   Allergies  Allergen Reactions  . Heparin Other (See Comments)    Heparin antibody positive; SRA negative    Review of Systems + for mild generalized distress  Physical Exam Weak chronically ill appearing lady Diminished breath sounds S1 S2 Abdomen distended Has edema on  extremities Generalized weakness Is awake but not entirely alert  Vital Signs: BP 87/67 mmHg  Pulse 95  Temp(Src) 97.3 F (36.3 C) (Oral)  Resp 20  Ht 5\' 7"  (1.702 m)  Wt 77 kg (169 lb 12.1 oz)  BMI 26.58 kg/m2  SpO2 96% Pain Assessment: Faces POSS *See Group Information*: 1-Acceptable,Awake and alert Pain Score: Asleep   SpO2: SpO2: 96 % O2 Device:SpO2: 96 % O2 Flow Rate: .O2 Flow Rate (L/min): 3 L/min  IO: Intake/output summary:  Intake/Output Summary (Last 24 hours) at 09/19/15 1527 Last data filed at 09/19/15 1300  Gross per 24 hour  Intake   1640 ml  Output      0 ml  Net   1640 ml    LBM: Last BM Date: 09/08/2015 Baseline Weight: Weight: 77 kg (169 lb 12.1 oz) Most recent weight: Weight: 77 kg (169 lb 12.1 oz)     Palliative Assessment/Data:   Flowsheet Rows        Most Recent Value   Intake Tab    Referral Department  Hospitalist   Unit at Time of Referral  Intermediate Care Unit   Palliative Care Primary Diagnosis  Cardiac   Palliative Care Type  New Palliative care   Reason for referral  Clarify Goals of Care   Date first seen by Palliative Care  09/19/15   Clinical Assessment    Palliative Performance Scale Score  30%   Pain Max last 24 hours  5   Pain Min Last 24 hours  4   Dyspnea Max Last 24 Hours  7   Dyspnea Min Last 24 hours  5   Psychosocial & Spiritual Assessment    Palliative Care Outcomes    Patient/Family meeting held?  Yes   Who was at the meeting?  daughter sister    Palliative Care Outcomes  Clarified goals of care      Time In: 1400 Time Out: 1500 Time Total: 60 min  Greater than 50%  of this time was spent counseling and coordinating care related to the above assessment and plan.  Signed by: Rosalin HawkingZeba Ryden Wainer, MD  [phone] Please contact Palliative Medicine  Team phone at [phone] for questions and concerns.  For individual provider: See Loretha StaplerAmion

## 2015-09-19 NOTE — Progress Notes (Signed)
Patient hypotensive to low 70s systolic, still somnolent.  Milrinone and iNO stopped.  Dobutamine ordered, but only a temporizing treatment.  Discussed with family.  Understanding of poor prognosis and no ability to cure or reverse heart failure but want everything done to save her life.  Most aggressive plan would be central line placement for vasopressors with CRRT.  Discussed with heart failure, critical care, and nephrology.

## 2015-09-19 NOTE — Progress Notes (Signed)
Change of plans, family requesting aggressive care, plan is for CRRT after temp cath placement.  See orders.    Vinson Moselleob Baillie Mohammad MD [facility] pager [phone]    cell [phone] 09/19/2015, 6:40 PM

## 2015-09-19 NOTE — Progress Notes (Signed)
PT Cancellation Note  Patient Details Name: [name] MRN: [id number] DOB: 12/23/1955   Cancelled Treatment:    Reason Eval/Treat Not Completed: Patient not medically ready (troponins elevating, will await down trend)   [name], [name] 09/19/2015, 8:06 AM [name], PT DPT  [phone]

## 2015-09-19 NOTE — Progress Notes (Addendum)
PULMONARY / CRITICAL CARE MEDICINE   Name: [name] MRN: [id number] DOB: 27-Jun-1955    ADMISSION DATE:  09/03/2015 CONSULTATION DATE:  09/07/2015  REFERRING MD:  [facility]  CHIEF COMPLAINT: bilateral leg/hip pain  HISTORY OF PRESENT ILLNESS:   31F hx ESRD on HD, prior CVA with residual aphasia, asthma on pulmicort and duoNebs, hypothyroidism, paroxysmal atrial fibrillation/flutter on ASA, patent foramen ovale, HTN, chronic diastolic heart failure, likely pulmonary HTN with RVSP 86 in 11/2014 (dilated RV and RA at that time) presenting with shortness of breath, wheezing and bilateral hip/leg pain. She presents with subacute symptoms of bilateral leg and hip pain for which she has been seen several times in the last 2 weeks (PCP, ED). The patient is aphasic and her daughter provides the bulk of the history. She reportedly dialyzed today and was able to complete her run.  In the ED she was noted to have a leukocytosis and soft blood pressures with SBP 80s-90s. Other labs of note include elevated BNP, troponin 0.83, mildly elevated AST at 119. Cr 4.59, BUN 17, lytes ok except for mild hyponatremia/chloremia. INR 1.62. Blood gas obtained and notable for pH 7.341 / 49.2 / 49.0 / 26.6 with sat 81% on unknown FiO2. SpO2 on 2L is 94%.   On my interview, they report that her shortness of breath is at baseline and that she missed her nebulized breathing treatment earlier today. She uses 2L chronically. Her complaint is the hip and leg pain. It is localized to the hips, does not radiate. The skin is indurated and warm. No drainage or weeping. No other complaints (no fever / chills / nausea / vomiting / diarrhea / constipation / urinary complaints / distal extremity complaints / incontinence)  SUBJECTIVE:  No events overnight, feels well.  VITAL SIGNS: BP 87/67 mmHg  Pulse 95  Temp(Src) 97.3 F (36.3 C) (Oral)  Resp 20  Ht 5\' 7"  (1.702 m)  Wt 77 kg (169 lb 12.1 oz)  BMI 26.58 kg/m2  SpO2  97%  HEMODYNAMICS:    VENTILATOR SETTINGS:    INTAKE / OUTPUT: I/O last 3 completed shifts: In: 1040 [P.O.:240; IV Piggyback:800] Out: 0   PHYSICAL EXAMINATION:  General Well nourished, well developed, no apparent distress, aphasic  HEENT No gross abnormalities. Oropharynx clear.   Pulmonary Clear to auscultation bilaterally with no wheezes, rales or ronchi. Poor effort, symmetrical expansion.   Cardiovascular Tachy low 100s. Regular rhythm. S1, s2. No m/r/g. PMI displaced rightward with marked RV heave noted. Distal pulses palpable. LUE AVF with palpable thrill  Abdomen Soft, non-tender, non-distended, positive bowel sounds, no palpable organomegaly or masses. Ventral hernia easily reduced. Midline scar and prior PEG site scar. Normoresonant to percussion.  Musculoskeletal Normal tone, moves all extremities. Grossly normal.   Lymphatics No cervical, supraclavicular or axillary adenopathy.   Neurologic Expressive aphasia with some delay in receptive comprehension. Follows 2-step commands. Strength 4+ /5 throughout.   Skin/Integuement No rash, no cyanosis, no clubbing. Tender to palpation over greater trochanter region bilaterally with marked induration and soft tissue swelling extending around to her sacrum. No drainage. Slightly warm to touch. No erythema noted.    LABS:  BMET  Recent Labs Lab 09/16/15 1903 09/02/2015 1454 09/19/15 0049  NA 133* 133* 132*  K 4.4 4.8 4.7  CL 95* 93* 96*  CO2 24 26 24   BUN 20 17 21*  CREATININE 5.29* 4.59* 5.22*  GLUCOSE 55* 76 114*   Electrolytes  Recent Labs Lab 09/16/15 1903 09/06/2015 1454  Aug 04, 2015 2206 09/19/15 0049  CALCIUM 9.2 9.4  --  9.1  MG  --   --  1.6*  --    CBC  Recent Labs Lab 09/16/15 1903 Aug 04, 2015 1620 09/19/15 0049  WBC 10.5 14.9* 11.0*  HGB 11.9* 10.2* 10.3*  HCT 36.8 32.7* 32.6*  PLT 214 194 166    Coag's  Recent Labs Lab Aug 04, 2015 1818 09/19/15 0049  APTT 40* 38*  INR 1.62* 1.56*   Sepsis  Markers  Recent Labs Lab Aug 04, 2015 1818  Aug 04, 2015 2206 09/19/15 0049 09/19/15 0546  LATICACIDVEN  --   < > 2.2* 3.1* 1.8  PROCALCITON 7.77  --   --   --   --   < > = values in this interval not displayed.  ABG  Recent Labs Lab Aug 04, 2015 1735  PHART 7.341*  PCO2ART 49.2*  PO2ART 49.0*   Liver Enzymes  Recent Labs Lab 09/16/15 1903 Aug 04, 2015 1454 09/19/15 0049  AST 47* 119* 65*  ALT 25 60* 43  ALKPHOS 90 103 78  BILITOT 1.3* 0.9 0.8  ALBUMIN 3.1* 3.1* 2.3*   Cardiac Enzymes  Recent Labs Lab Aug 04, 2015 2206 09/19/15 0049 09/19/15 0846  TROPONINI 0.60* 0.63* 0.68*   Glucose  Recent Labs Lab 09/16/15 2155 09/16/15 2228 09/16/15 2358 09/17/15 0144 Aug 04, 2015 1603 09/19/15 0856  GLUCAP 80 97 129* 82 57* 80   Imaging Dg Chest 2 View  2015/04/27  CLINICAL DATA:  Shortness of breath, chest pain. EXAM: CHEST  2 VIEW COMPARISON:  Radiographs of [place] 12, 2017. FINDINGS: Stable cardiomegaly. Minimal bilateral pleural effusions are noted. No pneumothorax is noted. No acute pulmonary disease is noted. Bony thorax is unremarkable. IMPRESSION: Stable cardiomegaly. Minimal bilateral pleural effusions. No other abnormality seen in the chest. Electronically Signed   By: Lupita RaiderJames  Green Jr, M.D.   On: 02017/01/24 15:55   Ct Angio Chest Pe W Or Wo Contrast  2015/04/27  CLINICAL DATA:  Hypoxia.  Shortness of breath and chest pain. EXAM: CT ANGIOGRAPHY CHEST WITH CONTRAST TECHNIQUE: Multidetector CT imaging of the chest was performed using the standard protocol during bolus administration of intravenous contrast. Multiplanar CT image reconstructions and MIPs were obtained to evaluate the vascular anatomy. CONTRAST:  100 cc Isovue 370 IV COMPARISON:  Chest radiograph earlier this day. Most recent CT 07/29/2014 FINDINGS: There are no filling defects within the pulmonary arteries to suggest pulmonary embolus. Prominent main pulmonary artery measuring 3.5 cm, with prominent right and left  pulmonary arteries. Upper limits normal ascending thoracic aorta measuring 3.5 cm. Moderate aortic atherosclerosis. Tortuosity is seen distally. No gross aortic dissection, phase of contrast limits assessment. Cardiomegaly with primarily right heart dilatation. Contrast refluxing into the hepatic veins and IVC consistent with right heart failure. Contrast refluxes into the azygos system. Coronary artery calcifications are seen. No pericardial effusion. Small right pleural effusion. Minimal fluid in the left interlobar fissure. No pulmonary edema. Within the left lower lobe there is a 5 x 5 mm nodule image 67 series 7. This was not seen on previous exam. There is mild emphysema. No confluent airspace disease. Other than venous reflux, no acute abnormality in the included upper abdomen. There are no acute or suspicious osseous abnormalities. Review of the MIP images confirms the above findings. IMPRESSION: 1. No pulmonary embolus. 2. Findings consistent with right heart failure. Right heart dilatation with large volume contrast refluxing into the hepatic veins, IVC, and azygos system. Small right pleural effusion. 3. Left lower lobe 5 mm pulmonary nodule. No follow-up needed  if patient is low-risk. Non-contrast chest CT can be considered in 12 months if patient is high-risk. This recommendation follows the consensus statement: Guidelines for Management of Incidental Pulmonary Nodules Detected on CT Images:From the Fleischner Society 2017; published online before print (10.1148/radiol.[id number]). Electronically Signed   By: Rubye Oaks M.D.   On: 09/13/2015 20:02     STUDIES:  CXR as above LE dopplers pending CTA PE pending  CULTURES: Blood cx 6/17 >>  ANTIBIOTICS: Vancomycin 6/17 >> Zosyn 6/17 >>  SIGNIFICANT EVENTS:   LINES/TUBES: PIV AVF  DISCUSSION: Ms. Calzada is a 45F with prior history of stroke with resultant aphasia, chronic hypoxemic respiratory failure on 2L O2 continuously,  asthma on pulmicort and duonebs, RV dilation on TTE and known chronic diastolic dysfunction, ESRD on TTSa HD, HTN, hypothyroidism and recently diagnosed pAfib/flutter on ASA. She presents with bilateral hip/leg pain that seems related to marked soft tissue swelling and induration in this region. Cellulitis cannot be excluded. Her shortness of breath is reportedly at baseline and she is on her home O2 requirement. Her low blood pressures may be related to her dialysis session today or the medications she has recently been started on (gabapentin, opioids) for her leg discomfort. She would benefit from increased activity and offloading of her pelvis to try to mobilize the fluid that seems to be collecting in the soft tissues around her pelvic girdle.   I reviewed CXR myself, mild pulmonary edema noted.  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 Pulmonary HTN history, continue home treatment.  CARDIOVASCULAR A:  Tachycardia Paroxysmal a fib / flutter on ASA Borderline low blood pressure RV dilation with severe TR and elevated RVSP on prior echo and CT findings consistent with RH failure (pHTN likely WHO group II) P:  Supportive care Close attention to volume status Optimize medical management of diastolic dysfunction TTE done, result pending. Cardiology following.  RENAL A:   ESRD on TTSa HD P:   HD per renal.  GASTROINTESTINAL A:   No acute issues P:   PPI  HEMATOLOGIC A:   Chronic anemia related to ESRD P:  Per renal.  INFECTIOUS A:   Leukocytosis - cannot exclude cellulitis or other infection P:   Follow cultures, pending. Continue vancomycin and zosyn  ENDOCRINE A:   Hypothyroidism P:    TSH noted.  NEUROLOGIC A:   Prior CVA with residual aphasia on ASA P:   Monitor for airway clearance.  Discussed with PCCM-NP.  PCCM will sign off, please call back if needed.    09/19/2015, 12:38 PM

## 2015-09-19 NOTE — Consult Note (Signed)
Renal Service Consult Note [facility]  [name] 09/19/2015 [name] D Requesting Physician:  Dr. Malachi BondsShort  Reason for Consult:  ESRD pt with hypotension HPI: The patient is a 60 y.o. year-old with hx of HTN, ESRD on HD 14 mos, hx CVA x 2 (in 2016), afib/ PFO, HIT, Cdif last yr and R HF / cor pulmonale w pulm HTN.  She gets maint HD at [facility] on TTS schedule.  This weekend (yest) was started on midodrine for low BP's.  She hasn't had problems w hypotension in OP setting per the dtr, she for many years had very high BP's in fact.   Patient presented with painful upper legs and swelling / edema that was worsening, evaluated and admitted. Patient sedentary at home, can do about 1/2 of ADL's, does walk in the house though.  DAughter takes her to HD.  In ED CXR was negative and CT angio chest showed R HF w contrast reflux into IVC, no edema or PE.  BNP high at 4500.  BP's overnight are low in the 80's- 90's.  ASked to see for ESRD.      Patient is lethargic and provdies little history.      Chart review: Mar- Jun '16 > acute resp failure w hypoxemia/H1N1 flu, acute R ACA stroke w aphasia/ L hemi, ESRD new start to HD, small bowel ischemia w partial SB resection c/b duod anast leak/ intra abd abscesses and EC fistula, Cdif , HTN, depresson, HIT, R HF Aug- Sept '16 > L MCA Acute CVA w R hemiparesis, rec'd TPA, embolic d/t known afib, global aphasia new, PFO, parox afib, DNR, PEG tube. [facility] to rehab, [facility] from rehab was getting tube feeds but also taking dysphagia diet.    ROS  denies CP  no joint pain   no HA  no blurry vision    Past Medical History  Past Medical History  Diagnosis Date  . Asthma   . Hypertension   . Gout   . Arthritis   . Insomnia   . Depression   . Aortic aneurysm without rupture (HCC)     3cm by CT 08/03/14  . Paroxysmal SVT (supraventricular tachycardia) (HCC)   . Cor pulmonale (HCC)   . PFO (patent foramen ovale)   . COPD  (chronic obstructive pulmonary disease) (HCC)   . Shortness of breath dyspnea     on home O2 at 2 L  . Stroke [facility](HCC) 06/2014, 11/2014    still has difficulty getting out some words  . Thyroid disease   . Anxiety   . Chronic kidney disease     dialysis on T/Th/Sa  . Renal insufficiency    Past Surgical History  Past Surgical History  Procedure Laterality Date  . Multiple tooth extractions    . Av fistula placement Left 01/28/2014    Procedure: ARTERIOVENOUS (AV) FISTULA CREATION;  Surgeon: Sherren Kernsharles E Fields, MD;  [facility];  Service: Vascular;  Laterality: Left;  . Tee without cardioversion N/A 06/23/2014    Procedure: TRANSESOPHAGEAL ECHOCARDIOGRAM (TEE);  Surgeon: Lewayne BuntingBrian S Crenshaw, MD;  [facility];  Service: Cardiovascular;  Laterality: N/A;  . Insertion of dialysis catheter Right 06/27/2014    Procedure: INSERTION OF Right Internal Jugular DIATEK CATHETER;  Surgeon: Pryor OchoaJames D Lawson, MD;  [facility];  Service: Vascular;  Laterality: Right;  . Laparotomy N/A 07/01/2014    Procedure: EXPLORATORY LAPAROTOMY WITH CLOSURE OF PERFORATED PYLORIC ULCER;  Surgeon: Claud KelpHaywood Ingram, MD;  Location: [facility] OR;  Service: General;  Laterality: N/A;  . Bowel resection N/A 07/01/2014    Procedure: SMALL BOWEL RESECTION;  Surgeon: Claud Kelp, MD;  Location: [facility] OR;  Service: General;  Laterality: N/A;  . 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 Resection of arteriovenous fistula aneurysm Left 07/09/2015    Procedure: LEFT UPPER ARM  ARTERIOVENOUS FISTULA  ANEURYSMORRPHY;  Surgeon: Pryor Ochoa, MD;  [facility] OR;  Service: Vascular;  Laterality: Left;   Family History  Family History  Problem Relation Age of Onset  . Diabetes Mother   . Hypertension Mother   . Heart disease  Mother   . Heart attack Mother   . Peripheral vascular disease Mother   . Diabetes Father   . Heart disease Father   . Hypertension Father   . Diabetes Sister   . Hypertension Sister   . Heart disease Sister     before age 7  . Heart attack Sister   . Peripheral vascular disease Sister   . Heart disease Brother   . Hyperlipidemia Daughter    Social History  reports that she quit smoking about 10 months ago. Her smoking use included Cigarettes. She has a 10 pack-year smoking history. She has never used smokeless tobacco. She reports that she does not drink alcohol or use illicit drugs. Allergies  Allergies  Allergen Reactions  . Heparin Other (See Comments)    Heparin antibody positive; SRA negative   Home medications Prior to Admission medications   Medication Sig Start Date End Date Taking? Authorizing Provider  albuterol (PROVENTIL HFA;VENTOLIN HFA) 108 (90 Base) MCG/ACT inhaler Inhale 2 puffs into the lungs every 6 (six) hours as needed for wheezing or shortness of breath. Reported on 07/06/2015 09/10/15  Yes Myrlene Broker, MD  albuterol (PROVENTIL) (2.5 MG/3ML) 0.083% nebulizer solution Take 3 mLs (2.5 mg total) by nebulization every 6 (six) hours as needed for wheezing or shortness of breath. 12/26/14  Yes Ranelle Oyster, MD  ALPRAZolam Prudy Feeler) 0.5 MG tablet Xanax 0.25 mg every 8 hours as needed and 0.5 mg every Tuesday Thursday Saturday with dialysis 09/10/15  Yes Myrlene Broker, MD  aspirin 325 MG tablet Take 1 tablet (325 mg total) by mouth daily. 12/24/14  Yes Daniel J Angiulli, PA-C  calcium acetate (PHOSLO) 667 MG capsule Take 667 mg by mouth 3 (three) times daily with meals.   Yes Historical Provider, MD  gabapentin (NEURONTIN) 100 MG capsule Take 1 capsule (100 mg total) by mouth daily as needed (pain). 09/10/15  Yes Myrlene Broker, MD  HYDROcodone-acetaminophen (NORCO/VICODIN) 5-325 MG tablet Take 1 tablet by mouth every 6 (six) hours as needed. 09/17/15  Yes  Tiffany Neva Seat, PA-C  ipratropium-albuterol (DUONEB) 0.5-2.5 (3) MG/3ML SOLN Take 3 mLs by nebulization 3 (three) times daily. 12/24/14  Yes Daniel J Angiulli, PA-C  levETIRAcetam (KEPPRA) 100 MG/ML solution Take 5 mLs (500 mg total) by mouth 2 (two) times daily. 12/24/14  Yes Daniel J Angiulli, PA-C  levothyroxine (SYNTHROID, LEVOTHROID) 25 MCG tablet Take 1 tablet (25 mcg total) by mouth daily before breakfast. 12/24/14  Yes Daniel J Angiulli, PA-C  loperamide (IMODIUM A-D) 2 MG tablet Take 2 mg by mouth 4 (four) times daily as needed for diarrhea or loose stools.  Yes Historical Provider, MD  metoprolol succinate (TOPROL-XL) 25 MG 24 hr tablet Take 1 tablet (25 mg total) by mouth daily. 05/25/15  Yes Myrlene Broker, MD  mirtazapine (REMERON) 15 MG tablet Place 1 tablet (15 mg total) into feeding tube at bedtime. Patient taking differently: Take 15 mg by mouth at bedtime.  12/24/14  Yes Daniel J Angiulli, PA-C  multivitamin (RENA-VIT) TABS tablet Take 1 tablet by mouth daily.   Yes Historical Provider, MD  pantoprazole (PROTONIX) 40 MG tablet Take 40 mg by mouth daily. Reported on 05/25/2015   Yes Historical Provider, MD  zaleplon (SONATA) 5 MG capsule Take 1 capsule (5 mg total) by mouth at bedtime as needed for sleep. 09/10/15  Yes Myrlene Broker, MD   Liver Function Tests  Recent Labs Lab 09/16/15 1903 October 09, 2015 1454 09/19/15 0049  AST 47* 119* 65*  ALT 25 60* 43  ALKPHOS 90 103 78  BILITOT 1.3* 0.9 0.8  PROT 8.3* 8.3* 6.5  ALBUMIN 3.1* 3.1* 2.3*   No results for input(s): LIPASE, AMYLASE in the last 168 hours. CBC  Recent Labs Lab 09/13/15 1555 09/16/15 1903 10-09-15 1620 09/19/15 0049  WBC 6.6 10.5 14.9* 11.0*  NEUTROABS 4.8 7.9* 12.4*  --   HGB 11.0* 11.9* 10.2* 10.3*  HCT 34.4* 36.8 32.7* 32.6*  MCV 84.1 84.8 84.3 84.7  PLT 187 214 194 166   Basic Metabolic Panel  Recent Labs Lab 09/13/15 1555 09/16/15 1903 10-09-15 1454 09/19/15 0049  NA 131* 133* 133*  132*  K 4.8 4.4 4.8 4.7  CL 94* 95* 93* 96*  CO2 18* 24 26 24   GLUCOSE 33* 55* 76 114*  BUN 60* 20 17 21*  CREATININE 10.02* 5.29* 4.59* 5.22*  CALCIUM 8.6* 9.2 9.4 9.1   Iron/TIBC/Ferritin/ %Sat    Component Value Date/Time   IRON 34 12/15/2014 1815   TIBC 315 12/15/2014 1815   FERRITIN 679* 12/15/2014 1815   IRONPCTSAT 11 12/15/2014 1815    Filed Vitals:   09/19/15 0829 09/19/15 0858 09/19/15 1221 09/19/15 1228  BP:  100/68 76/64 87/67   Pulse:  94 96 95  Temp:  97.5 F (36.4 C) 97.3 F (36.3 C)   TempSrc:  Oral Oral   Resp:  22 18 20   Height:      Weight:      SpO2: 94% 92% 94% 97%   Exam Gen is lethargic No rash, cyanosis or gangrene Sclera anicteric, throat clear +JVD Chest clear bilat , no wheezing or rhonchi RRR no MRG, sustained PMI and displaced laterally Abd soft ntnd no mass or ascites +bs GU defer MS no joint effusions or deformity Ext 2+ dense, firm edema of bilat LE"s up to bilat hips, tender In the areas w the most edema/ no wounds or ulcers Neuro is somnolent, arousable LUA AVF +bruit   Dialysis: [facility]  4h  75.5kg  3K/ 2.25 bath  Hep none   LUA AVF Mircera 225 last 6/13 (11.1, 15%, 997) Hect 6ug, phoslo 2ac  (9.0/ 6.7, 330 pth) Dry wt recently dec'd from 76.5kg Missing about 1 session / week, stays on full rx when she comes   Assessment: 1  Bilat LE swelling/ painful edema of upper legs - no cellulitis.  Known hx of cor pulmonale, pt has decomp RHF now.  Will d/w primary team.  2  ESRD HD tts 3  Hypotension - just started on midodrine this week 4  Hx CVA's 5  PAF 6  Anemia Hb  10.3, no esa for now 7  MBD as above, no chgs   Plan - will need cardiology input re: CHF, no acute need for dialysis, plan RRT soon (HD vs CRRT)  [name] MD [facility] pager [phone]    cell [phone] 09/19/2015, 12:39 PM  '

## 2015-09-19 NOTE — Progress Notes (Signed)
OT Cancellation Note  Patient Details Name: Jenascia C Roser MRN: [id number] DOB: 05-Dec-1955   Cancelled Treatment:    Reason Eval/Treat Not Completed: Medical issues which prohibited therapy.  Gaye AlkenBailey A Florette Thai M.S., OTR/L Pager: [phone]  09/19/2015, 3:54 PM

## 2015-09-19 NOTE — Progress Notes (Signed)
Report attempted.  Nurse to call back.

## 2015-09-19 NOTE — Progress Notes (Signed)
Per cardiology is to start on low-dose IV milrinone and inhaled NO.  No plan for CRRT as will not likely change outcome. Will plan regular dialysis tomorrow with [facility] as BP tolerates.    Vinson Moselleob Shyheem Whitham MD [facility] pager [phone]    cell [phone] 09/19/2015, 2:09 PM

## 2015-09-19 NOTE — Progress Notes (Signed)
Utilization review completed.  

## 2015-09-19 NOTE — Progress Notes (Signed)
Pt's BP 78/58. Milrinone on hold now per MD's order. MD notified. Will continue to monitor.

## 2015-09-19 NOTE — Progress Notes (Signed)
Nitric stopped per MD 

## 2015-09-19 NOTE — Procedures (Signed)
Central Venous dialysis Catheter Insertion Procedure Note [name] [id number] 04-01-1956  Procedure: Insertion of Central Venous Catheter Indications: dialysis  Procedure Details Consent: Risks of procedure as well as the alternatives and risks of each were explained to the (patient/caregiver).  Consent for procedure obtained. Time Out: Verified patient identification, verified procedure, site/side was marked, verified correct patient position, special equipment/implants available, medications/allergies/relevent history reviewed, required imaging and test results available.  Performed  Real time US used to ID and cannulate vessel   Maximum sterile technique was used including antiseptics, cap, gloves, gown, hand hygiene, mask and sheet. Skin prep: Chlorhexidine; local anesthetic administered A antimicrobial bonded/coated triple lumen catheter was placed in the right internal jugular vein using the Seldinger technique.  Evaluation Blood flow good Complications: No apparent complications Patient did tolerate procedure well. Chest X-ray ordered to verify placement.  CXR: pending.  [name] 09/19/2015, 7:16 PM  [name], M.D. [facility]. Pager: [phone]. After hours pager: [phone].

## 2015-09-19 NOTE — Progress Notes (Signed)
PROGRESS NOTE  [name]  YNW:[id number] DOB: 1956-03-18 DOA: 10-12-15 PCP: Myrlene Broker, MD  Brief Narrative:   [phone] hx ESRD on HD, prior CVA with residual aphasia, asthma on pulmicort and duoNebs, hypothyroidism, paroxysmal atrial fibrillation/flutter on ASA, patent foramen ovale, HTN, chronic diastolic heart failure, likely pulmonary HTN with RVSP 86 in 11/2014 (dilated RV and RA at that time), ESRD on HD presenting with shortness of breath, wheezing and bilateral hip/leg pain. She presented with subacute symptoms of bilateral leg and hip pain for which she has been seen several times in the 2 weeks prior to admission.  In the ED she was noted to have a leukocytosis and soft blood pressures with SBP 80s-90s. Troponin 0.83, INR 1.62. Blood gas obtained and notable for pH 7.341 / 49.2 / 49.0 / 26.6 with sat 81% on unknown FiO2. SpO2 on 2L is 94%.    Assessment & Plan:   Principal Problem:   Hypotension Active Problems:   Hypertension   ESRD needing dialysis (HCC)   Depression   Chronic diastolic heart failure (HCC)   Tobacco use disorder   Hyperlipidemia   Stroke (HCC)   Protein calorie malnutrition (HCC)   Paroxysmal a-fib (HCC)   HLD (hyperlipidemia)   Left middle cerebral artery stroke (HCC)   Aphasia due to stroke   ESRD on dialysis (HCC)   Volume overload   Elevated troponin   Hypoxia   Leukocytosis   Anemia   Pressure ulcer  Hypotension, may be secondary to underlying cellulitis however she is afebrile and without avery high white blood cell count. I suspect that she has progression of her right heart failure.Her prognosis is very poor. -  Cardiology to determine if they feel like she may be a candidate for right heart catheterization to determine if sildenafil or other advanced agent may be helpful in her case -  Blood pressures have improved with some IV fluids, however I am concerned because she is grossly volume overloaded with severe and painful lower  extremity edema.  Due to her low blood pressures and preload dependence, I suspect that volume removal with HD is challenging.   -  Continue vancomycin and Zosyn pending culture results  Chronic diastolic heart failure/right-sided heart failure - Patient with severe pulmonary hypertension from prior 2-D echo from 11/12/2014 with a EF of 60-65% no wall motion abnormalities severely dilated right atrium, mildly reduced right ventricular systolic function, pulmonary artery systolic pressure severely increased with a PA peak pressure of 86 mmHg.  -  Volume management with dialysis -  ECHO pending  Elevated troponin secondary to right heart failure and ESRD -  Elevated but flat -  Continue aspirin -  Beta blocker on hold secondary to hypotension  Pain in legs, soft tissue pain in areas of edematous skin -  Start low dose concentrated morphine  End-stage renal disease on hemodialysis Nephrology has been consulted and will be seen the patient.  Transaminitis Likely secondary to hepatic congestion. Follow.  Leukocytosis Anuric Blood cultures 2 NGTD  Chest x-ray negative for any acute infiltrate.   Acute hypoxic respiratory failure secondary to right heart failure - ABG:  pH of 7.34 PCO2 of 49 PO2 of 49 with O2 sats of 81.  -  Severe pulmonary hypertension  Protein calorie malnutrition -  Nutrition consultation with supplements  SVT versus history of A. fib Hold beta blocker for now secondary to hypotension. Metoprolol IV when necessary heart rate greater than 120. Cardiology assistance appreciated  History of CVA Stable. Continue aspirin for secondary stroke prevention. Continue Keppra for seizure prophylaxis.  Hypothyroidism - TSH mildly elevated in setting of acute illness -  Continue home dose Synthroid and repeat in 3-4 weeks  DVT prophylaxis: SCDs Code Status: Full Family Communication: Updated patient and daughter at bedside. Disposition Plan:  Continue in stepdown unit.   I think she and her daughter would benefit from a palliative care consultation.    Consultants:   Cardiology  Critical care  Procedures:  CXR Lower extremity duplex:  Negative for DVT CT angio chest   Antimicrobials:   Vancomycin 6/17  Zosyn 6/17   Subjective:  atient with ap to communicate with nods and shakes of the head. Denies chest painor shortness of breath worse than her usual. She has pain in her bilateral legs, particularly in her lateral hips.  Denies lightheadedness.  Objective: Filed Vitals:   09/19/15 0333 09/19/15 0400 09/19/15 0829 09/19/15 0858  BP: 85/68 95/66  100/68  Pulse:    94  Temp:    97.5 F (36.4 C)  TempSrc:    Oral  Resp:    22  Height:      Weight:      SpO2:   94% 92%    Intake/Output Summary (Last 24 hours) at 09/19/15 1144 Last data filed at 09/19/15 0900  Gross per 24 hour  Intake   1400 ml  Output      0 ml  Net   1400 ml   Filed Weights   09/30/2015 2100  Weight: 77 kg (169 lb 12.1 oz)    Examination:  General exam:  Adult female. Ill appearing. tachypneic HEENT:  NCAT, MMM Respiratory system: But otherwise lungs are clear to auscultation bilaterally Cardiovascular system: Regular rate and rhythm, 2/6 murmur ith precordial heave.  Warm extremities Gastrointestinal system: Normal active bowel sounds, soft, nondistended, nontender. MSK:  Normal tone and bulk, woody bilateral lower extremity edema and dependent areas, particularly along lateral hips bilaterally which is very tender to palpation.No obvious overlying erythema. Areas are a little bit warm No obvious purulence or skin breaks or ulcers Neuro:  Grossly moves all extremities. Limited ability to communicate secondary to previous strokes. Upper extremity strength 4 minus out of 5 bilaterally. Lower extremity strength 3 out of 5 bilaterally.    Data Reviewed: I have personally reviewed following labs and imaging studies  CBC:  Recent Labs Lab 09/13/15 1555  09/16/15 1903 09/23/2015 1620 09/19/15 0049  WBC 6.6 10.5 14.9* 11.0*  NEUTROABS 4.8 7.9* 12.4*  --   HGB 11.0* 11.9* 10.2* 10.3*  HCT 34.4* 36.8 32.7* 32.6*  MCV 84.1 84.8 84.3 84.7  PLT 187 214 194 166   Basic Metabolic Panel:  Recent Labs Lab 09/13/15 1555 09/16/15 1903 09/12/2015 1454 09/17/2015 2206 09/19/15 0049  NA 131* 133* 133*  --  132*  K 4.8 4.4 4.8  --  4.7  CL 94* 95* 93*  --  96*  CO2 18* 24 26  --  24  GLUCOSE 33* 55* 76  --  114*  BUN 60* 20 17  --  21*  CREATININE 10.02* 5.29* 4.59*  --  5.22*  CALCIUM 8.6* 9.2 9.4  --  9.1  MG  --   --   --  1.6*  --    GFR: Estimated Creatinine Clearance: 12.4 mL/min (by C-G formula based on Cr of 5.22). Liver Function Tests:  Recent Labs Lab 09/16/15 1903 09/22/2015 1454 09/19/15 0049  AST  47* 119* 65*  ALT 25 60* 43  ALKPHOS 90 103 78  BILITOT 1.3* 0.9 0.8  PROT 8.3* 8.3* 6.5  ALBUMIN 3.1* 3.1* 2.3*   No results for input(s): LIPASE, AMYLASE in the last 168 hours. No results for input(s): AMMONIA in the last 168 hours. Coagulation Profile:  Recent Labs Lab 2015/10/07 1818 09/19/15 0049  INR 1.62* 1.56*   Cardiac Enzymes:  Recent Labs Lab 10-07-15 1454 October 07, 2015 1620 October 07, 2015 2206 09/19/15 0049 09/19/15 0846  CKTOTAL  --  74  --   --   --   TROPONINI 0.83*  --  0.60* 0.63* 0.68*   BNP (last 3 results) No results for input(s): PROBNP in the last 8760 hours. HbA1C: No results for input(s): HGBA1C in the last 72 hours. CBG:  Recent Labs Lab 09/16/15 2228 09/16/15 2358 09/17/15 0144 2015-10-07 1603 09/19/15 0856  GLUCAP 97 129* 82 57* 80   Lipid Profile: No results for input(s): CHOL, HDL, LDLCALC, TRIG, CHOLHDL, LDLDIRECT in the last 72 hours. Thyroid Function Tests:  Recent Labs  09/19/15 0546  TSH 5.094*   Anemia Panel: No results for input(s): VITAMINB12, FOLATE, FERRITIN, TIBC, IRON, RETICCTPCT in the last 72 hours. Urine analysis:    Component Value Date/Time   COLORURINE  YELLOW 02/23/2015 1454   APPEARANCEUR HAZY* 02/23/2015 1454   LABSPEC 1.006 02/23/2015 1454   PHURINE 8.5* 02/23/2015 1454   GLUCOSEU NEGATIVE 02/23/2015 1454   HGBUR MODERATE* 02/23/2015 1454   BILIRUBINUR NEGATIVE 02/23/2015 1454   KETONESUR NEGATIVE 02/23/2015 1454   PROTEINUR 100* 02/23/2015 1454   UROBILINOGEN 1.0 11/10/2014 1336   NITRITE NEGATIVE 02/23/2015 1454   LEUKOCYTESUR TRACE* 02/23/2015 1454   Sepsis Labs: @LABRCNTIP (procalcitonin:4,lacticidven:4)  ) Recent Results (from the past 240 hour(s))  MRSA PCR Screening     Status: None   Collection Time: Oct 07, 2015  9:25 PM  Result Value Ref Range Status   MRSA by PCR NEGATIVE NEGATIVE Final    Comment:        The GeneXpert MRSA Assay (FDA approved for NASAL specimens only), is one component of a comprehensive MRSA colonization surveillance program. It is not intended to diagnose MRSA infection nor to guide or monitor treatment for MRSA infections.       Radiology Studies: Dg Chest 2 View  Oct 07, 2015  CLINICAL DATA:  Shortness of breath, chest pain. EXAM: CHEST  2 VIEW COMPARISON:  Radiographs of [place]ya 12, 2017. FINDINGS: Stable cardiomegaly. Minimal bilateral pleural effusions are noted. No pneumothorax is noted. No acute pulmonary disease is noted. Bony thorax is unremarkable. IMPRESSION: Stable cardiomegaly. Minimal bilateral pleural effusions. No other abnormality seen in the chest. Electronically Signed   By: Lupita Raider, M.D.   On: 10/07/15 15:55   Ct Angio Chest Pe W Or Wo Contrast  2015/10/07  CLINICAL DATA:  Hypoxia.  Shortness of breath and chest pain. EXAM: CT ANGIOGRAPHY CHEST WITH CONTRAST TECHNIQUE: Multidetector CT imaging of the chest was performed using the standard protocol during bolus administration of intravenous contrast. Multiplanar CT image reconstructions and MIPs were obtained to evaluate the vascular anatomy. CONTRAST:  100 cc Isovue 370 IV COMPARISON:  Chest radiograph earlier this day.  Most recent CT 07/29/2014 FINDINGS: There are no filling defects within the pulmonary arteries to suggest pulmonary embolus. Prominent main pulmonary artery measuring 3.5 cm, with prominent right and left pulmonary arteries. Upper limits normal ascending thoracic aorta measuring 3.5 cm. Moderate aortic atherosclerosis. Tortuosity is seen distally. No gross aortic dissection, phase of contrast  limits assessment. Cardiomegaly with primarily right heart dilatation. Contrast refluxing into the hepatic veins and IVC consistent with right heart failure. Contrast refluxes into the azygos system. Coronary artery calcifications are seen. No pericardial effusion. Small right pleural effusion. Minimal fluid in the left interlobar fissure. No pulmonary edema. Within the left lower lobe there is a 5 x 5 mm nodule image 67 series 7. This was not seen on previous exam. There is mild emphysema. No confluent airspace disease. Other than venous reflux, no acute abnormality in the included upper abdomen. There are no acute or suspicious osseous abnormalities. Review of the MIP images confirms the above findings. IMPRESSION: 1. No pulmonary embolus. 2. Findings consistent with right heart failure. Right heart dilatation with large volume contrast refluxing into the hepatic veins, IVC, and azygos system. Small right pleural effusion. 3. Left lower lobe 5 mm pulmonary nodule. No follow-up needed if patient is low-risk. Non-contrast chest CT can be considered in 12 months if patient is high-risk. This recommendation follows the consensus statement: Guidelines for Management of Incidental Pulmonary Nodules Detected on CT Images:From the Fleischner Society 2017; published online before print (10.1148/[id number]). Electronically Signed   By: Rubye OaksMelanie  Ehinger M.D.   On: 22-Jun-2015 20:02     Scheduled Meds: . aspirin  325 mg Oral Daily  . budesonide (PULMICORT) nebulizer solution  0.25 mg Nebulization BID  . calcium acetate  667  mg Oral TID WC  . guaiFENesin  1,200 mg Oral BID  . ipratropium  0.5 mg Nebulization TID  . levalbuterol  0.63 mg Nebulization TID  . levETIRAcetam  500 mg Oral BID  . levothyroxine  25 mcg Oral QAC breakfast  . multivitamin  1 tablet Oral QHS  . pantoprazole  40 mg Oral Daily  . piperacillin-tazobactam (ZOSYN)  IV  2.25 g Intravenous Q8H  . sodium chloride flush  3 mL Intravenous Q12H  . sodium chloride flush  3 mL Intravenous Q12H  . [START ON 09/21/2015] vancomycin  750 mg Intravenous Q T,Th,Sa-HD   Continuous Infusions:    LOS: 1 day    Time spent: 30 min    Renae FickleSHORT, Carlisle Enke, MD [facility] Pager [phone]  If 7PM-7AM, please contact night-coverage [email or web address] Password [email or web address] 09/19/2015, 11:44 AM

## 2015-09-19 NOTE — Progress Notes (Signed)
eLink Physician-Brief Progress Note Patient Name: Bethzy C Duvall DOB: 1955/09/17 MRN: [id number]   Date of Service  09/19/2015  HPI/Events of Note  Severe PH/ renal failure now low bp  eICU Interventions  Moved to CCU for CVVH  As can't tol HD  PCCM already following/ will monitor     Intervention Category Major Interventions: Shock - evaluation and management  Sandrea HughsMichael Luiz Trumpower 09/19/2015, 7:12 PM

## 2015-09-19 NOTE — Progress Notes (Signed)
No urine observed with in and out cath *2. Bladder scanned with 20ml volume in bladder. Unable to obtain urine sample at this time. Will continue to monitor for urinary output.

## 2015-09-19 NOTE — Progress Notes (Signed)
  Echocardiogram 2D Echocardiogram has been performed.  Janalyn HarderWest, Jamye Balicki R 09/19/2015, 12:28 PM

## 2015-09-19 NOTE — Progress Notes (Signed)
Pharmacy Antibiotic Note  Clessie Mikeal HawthorneC Borgwardt is a 60 y.o. female admitted on 09/12/2015 with sepsis.  Pharmacy has been consulted for vancomycin and Zosyn dosing.  Patient to transition to CRRT from HD; her last dialysis session was on 09/17/15.   Plan: - Change vanc to 750mg  IV Q24H, start tomorrow - Change Zosyn to 3.375gm IV Q6H - Monitor CRRT tolerance/interruption, clinical progress, vanc level as indicated  Height: 5\' 7"  (170.2 cm) Weight: 169 lb 12.1 oz (77 kg) IBW/kg (Calculated) : 61.6  Temp (24hrs), Avg:97.8 F (36.6 C), Min:97.3 F (36.3 C), Max:98.4 F (36.9 C)   Recent Labs Lab 09/13/15 1555 09/16/15 1903 09/15/2015 1454 09/14/2015 1620 09/27/2015 1903 09/19/2015 2206 09/19/15 0049 09/19/15 0546 09/19/15 1622  WBC 6.6 10.5  --  14.9*  --   --  11.0*  --   --   CREATININE 10.02* 5.29* 4.59*  --   --   --  5.22*  --   --   LATICACIDVEN  --   --   --   --  1.7 2.2* 3.1* 1.8 1.7    Estimated Creatinine Clearance: 12.4 mL/min (by C-G formula based on Cr of 5.22).    Allergies  Allergen Reactions  . Heparin Other (See Comments)    Heparin antibody positive; SRA negative    Antimicrobials this admission: Vanc 6/17 >> Zosyn 6/17 >>  Dose adjustments this admission: N/A  Microbiology results: 6/17 BCx x2 - 6/17 UCx -  6/17 MRSA PCR - negative   Jon Lall D. Laney Potashang, PharmD, BCPS Pager:  [phone] 09/19/2015, 6:00 PM

## 2015-09-19 NOTE — Progress Notes (Signed)
Patient having worsening hypotension but appears volume overloaded in legs.  She is also sleepier than earlier today.  Her prognosis is poor.  Spoke with daughter who states that her faith got her through all of the medical problems, including critical illness in the last two years, even when the doctors said her mother would not make it.  She states she spoke to her mother yesterday about GOC and her mother wanted full code, ICU, etc, if necessary to try to save her life.  Explained that unlike some of her previous illness where the doctors had tools at their disposal to reverse or cure the underlying problem, we do not have a cure for pulmonary hypertension and right heart failure and that is a fatal disease.  I am worried about her surviving this hospitalization.  Daughter is calling her brother and her aunt to visit.  I spoke with critical care who is deferring medical decision-making to heart failure team.  Dr. Gala RomneyBensimhon to bedside.

## 2015-09-19 NOTE — Progress Notes (Signed)
PULMONARY / CRITICAL CARE MEDICINE   Name: [name] MRN: [id number] DOB: Mar 26, 1956    ADMISSION DATE:  09/12/2015 CONSULTATION DATE:  09/17/2015  REFERRING MD:  [facility]  CHIEF COMPLAINT: bilateral leg/hip pain  HISTORY OF PRESENT ILLNESS:   60F hx ESRD on HD, prior CVA with residual aphasia, asthma on pulmicort and duoNebs, hypothyroidism, paroxysmal atrial fibrillation/flutter on ASA, patent foramen ovale, HTN, chronic diastolic heart failure, likely pulmonary HTN with RVSP 86 in 11/2014 (dilated RV and RA at that time) presenting with shortness of breath, wheezing and bilateral hip/leg pain. PCCM asked to see for cardiogenic shock   SUBJECTIVE:  No events overnight, feels well.  VITAL SIGNS: BP 78/58 mmHg  Pulse 95  Temp(Src) 97.3 F (36.3 C) (Oral)  Resp 20  Ht 5\' 7"  (1.702 m)  Wt 169 lb 12.1 oz (77 kg)  BMI 26.58 kg/m2  SpO2 96%  HEMODYNAMICS:    VENTILATOR SETTINGS:    INTAKE / OUTPUT: I/O last 3 completed shifts: In: 1040 [P.O.:240; IV Piggyback:800] Out: 0   PHYSICAL EXAMINATION:  General Well nourished, well developed, no apparent distress, aphasic  HEENT No gross abnormalities. Oropharynx clear.   Pulmonary Scattered rhonchi, Poor effort, symmetrical expansion.   Cardiovascular Tachy . Regular rhythm. S1, s2. No m/r/g. PMI displaced rightward with marked RV heave noted. Distal pulses palpable. LUE AVF with palpable thrill  Abdomen Soft, non-tender, non-distended, positive bowel sounds, no palpable organomegaly or masses. Ventral hernia easily reduced. Midline scar and prior PEG site scar. Normoresonant to percussion.  Musculoskeletal Normal tone, moves all extremities. Grossly normal.   Lymphatics No cervical, supraclavicular or axillary adenopathy.   Neurologic Expressive aphasia with some delay in receptive comprehension. Follows 2-step commands. Strength 4+ /5 throughout.   Skin/Integuement No rash, no cyanosis, no clubbing. Tender to palpation over  greater trochanter region bilaterally with marked induration and soft tissue swelling extending around to her sacrum. No drainage. Slightly warm to touch. No erythema noted.    LABS:  BMET  Recent Labs Lab 09/16/15 1903 09/14/2015 1454 09/19/15 0049  NA 133* 133* 132*  K 4.4 4.8 4.7  CL 95* 93* 96*  CO2 24 26 24   BUN 20 17 21*  CREATININE 5.29* 4.59* 5.22*  GLUCOSE 55* 76 114*   Electrolytes  Recent Labs Lab 09/16/15 1903 09/20/2015 1454 09/11/2015 2206 09/19/15 0049  CALCIUM 9.2 9.4  --  9.1  MG  --   --  1.6*  --    CBC  Recent Labs Lab 09/16/15 1903 09/27/2015 1620 09/19/15 0049  WBC 10.5 14.9* 11.0*  HGB 11.9* 10.2* 10.3*  HCT 36.8 32.7* 32.6*  PLT 214 194 166    Coag's  Recent Labs Lab 09/27/2015 1818 09/19/15 0049  APTT 40* 38*  INR 1.62* 1.56*   Sepsis Markers  Recent Labs Lab 09/07/2015 1818  09/28/2015 2206 09/19/15 0049 09/19/15 0546  LATICACIDVEN  --   < > 2.2* 3.1* 1.8  PROCALCITON 7.77  --   --   --   --   < > = values in this interval not displayed.  ABG  Recent Labs Lab 09/07/2015 1735  PHART 7.341*  PCO2ART 49.2*  PO2ART 49.0*   Liver Enzymes  Recent Labs Lab 09/16/15 1903 09/30/2015 1454 09/19/15 0049  AST 47* 119* 65*  ALT 25 60* 43  ALKPHOS 90 103 78  BILITOT 1.3* 0.9 0.8  ALBUMIN 3.1* 3.1* 2.3*   Cardiac Enzymes  Recent Labs Lab 09/15/2015 2206 09/19/15 0049 09/19/15 [id number]  TROPONINI 0.60* 0.63* 0.68*   Glucose  Recent Labs Lab 09/16/15 2155 09/16/15 2228 09/16/15 2358 09/17/15 0144 09/06/2015 1603 09/19/15 0856  GLUCAP 80 97 129* 82 57* 80   Imaging Ct Angio Chest Pe W Or Wo Contrast  09/23/2015  CLINICAL DATA:  Hypoxia.  Shortness of breath and chest pain. EXAM: CT ANGIOGRAPHY CHEST WITH CONTRAST TECHNIQUE: Multidetector CT imaging of the chest was performed using the standard protocol during bolus administration of intravenous contrast. Multiplanar CT image reconstructions and MIPs were obtained to evaluate  the vascular anatomy. CONTRAST:  100 cc Isovue 370 IV COMPARISON:  Chest radiograph earlier this day. Most recent CT 07/29/2014 FINDINGS: There are no filling defects within the pulmonary arteries to suggest pulmonary embolus. Prominent main pulmonary artery measuring 3.5 cm, with prominent right and left pulmonary arteries. Upper limits normal ascending thoracic aorta measuring 3.5 cm. Moderate aortic atherosclerosis. Tortuosity is seen distally. No gross aortic dissection, phase of contrast limits assessment. Cardiomegaly with primarily right heart dilatation. Contrast refluxing into the hepatic veins and IVC consistent with right heart failure. Contrast refluxes into the azygos system. Coronary artery calcifications are seen. No pericardial effusion. Small right pleural effusion. Minimal fluid in the left interlobar fissure. No pulmonary edema. Within the left lower lobe there is a 5 x 5 mm nodule image 67 series 7. This was not seen on previous exam. There is mild emphysema. No confluent airspace disease. Other than venous reflux, no acute abnormality in the included upper abdomen. There are no acute or suspicious osseous abnormalities. Review of the MIP images confirms the above findings. IMPRESSION: 1. No pulmonary embolus. 2. Findings consistent with right heart failure. Right heart dilatation with large volume contrast refluxing into the hepatic veins, IVC, and azygos system. Small right pleural effusion. 3. Left lower lobe 5 mm pulmonary nodule. No follow-up needed if patient is low-risk. Non-contrast chest CT can be considered in 12 months if patient is high-risk. This recommendation follows the consensus statement: Guidelines for Management of Incidental Pulmonary Nodules Detected on CT Images:From the Fleischner Society 2017; published online before print (10.1148/[id number]). Electronically Signed   By: Rubye Oaks M.D.   On: 09/29/2015 20:02     STUDIES:  CXR as above LE dopplers  pending CTA PE pending  CULTURES: Blood cx 6/17 >>  ANTIBIOTICS: Vancomycin 6/17 >> Zosyn 6/17 >>  SIGNIFICANT EVENTS:   LINES/TUBES: PIV AVF  DISCUSSION: [name] is a 60F with prior history of stroke with resultant aphasia, chronic hypoxemic respiratory failure on 2L O2 continuously, asthma on pulmicort and duonebs, RV dilation on TTE and known chronic diastolic dysfunction, ESRD on TTSa HD, HTN, hypothyroidism and recently diagnosed pAfib/flutter on ASA. She presents with bilateral hip/leg pain that seems related to marked soft tissue swelling and induration in this region. Cellulitis considered. Her shortness of breath is reportedly at baseline and she is on her home O2 requirement. She is now in cardiogenic shock. Will see if placing her on pressors will allow [place] to mobilize fluid w/ CRRT. Have d/w family DNR and DNI status. They are receptive to this w/ the hopes that we try the above but if fails no CPR or intubation. They still need to discuss this however and will confirm this with [place] soon.   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 Limited code to be confirmed.  CARDIOVASCULAR A:  Tachycardia Paroxysmal a fib / flutter on ASA RV dilation with severe TR and elevated RVSP on prior echo and CT findings consistent with RH failure (pHTN likely WHO group II) Cardiogenic shock P:  Supportive care Central access Levophed gtt Hopefully volume removal w/ CRRT Additional recs per cards   RENAL A:   ESRD on TTSa HD Massive volume overload  P:   CRRT per renal  GASTROINTESTINAL A:   No acute issues P:   PPI  HEMATOLOGIC A:   Chronic anemia related to ESRD P:  Per renal.  INFECTIOUS A:   Leukocytosis - cannot exclude  cellulitis or other infection--> but doubt P:   Follow cultures, pending. Continue vancomycin and zosyn  ENDOCRINE A:   Hypothyroidism P:   TSH noted.  NEUROLOGIC A:   Prior CVA with residual aphasia on ASA P:   Hold sedating meds  Simonne Martinet ACNP-BC [facility] Pulmonary/Critical Care Pager # [phone] OR # [phone] if no answer  Attending Note:  60 year old female with ESRD on HD as well as left and right heart failure who presents to the hospital with SOB and hypotension.  Patient is usually hypotensive and is on midodrine.  Seen earlier and palliative care was being called however family wished for full code status.  PCCM was called back to evaluate for pressor use, move to the ICU and for CRRT to be started.  The patient is well known to me, I have worked with her and her family on a previous hospitalization.  We had an extensive conversation with the patient's daughter who is her HCPOA.  After discussion, patient is limited code with no CPR/cardioversion/intubation but pressors and CRRT ok.  Will move patient to the ICU, insert an HD catheter and place on pressor.  Hope is that we can get enough volume off to relieve the failing right heart but if patient decompensates further then will likely have to speak to the family and change route to comfort care.  PCCM will take over care.  The patient is critically ill with multiple organ systems failure and requires high complexity decision making for assessment and support, frequent evaluation and titration of therapies, application of advanced monitoring technologies and extensive interpretation of multiple databases.   Critical Care Time devoted to patient care services described in this note is  35  Minutes. This time reflects time of care of this signee Dr Koren Bound. This critical care time does not reflect procedure time, or teaching time or supervisory time of PA/NP/Med student/Med Resident etc but could involve care discussion  time.  Alyson Reedy, M.D. [facility] Pulmonary/Critical Care Medicine. Pager: [phone]. After hours pager: [phone].  09/19/2015, 4:18 PM

## 2015-09-20 DIAGNOSIS — I5033 Acute on chronic diastolic (congestive) heart failure: Secondary | ICD-10-CM

## 2015-09-20 DIAGNOSIS — N049 Nephrotic syndrome with unspecified morphologic changes: Secondary | ICD-10-CM

## 2015-09-20 DIAGNOSIS — I959 Hypotension, unspecified: Secondary | ICD-10-CM

## 2015-09-20 LAB — RENAL FUNCTION PANEL
ALBUMIN: 2.2 g/dL — AB (ref 3.5–5.0)
ANION GAP: 9 (ref 5–15)
Albumin: 2.3 g/dL — ABNORMAL LOW (ref 3.5–5.0)
Anion gap: 7 (ref 5–15)
BUN: 21 mg/dL — ABNORMAL HIGH (ref 6–20)
BUN: 17 mg/dL (ref 6–20)
CALCIUM: 8.7 mg/dL — AB (ref 8.9–10.3)
CALCIUM: 8.7 mg/dL — AB (ref 8.9–10.3)
CHLORIDE: 101 mmol/L (ref 101–111)
CO2: 25 mmol/L (ref 22–32)
CO2: 26 mmol/L (ref 22–32)
CREATININE: 4.77 mg/dL — AB (ref 0.44–1.00)
CREATININE: 4.06 mg/dL — AB (ref 0.44–1.00)
Chloride: 99 mmol/L — ABNORMAL LOW (ref 101–111)
GFR calc Af Amer: 11 mL/min — ABNORMAL LOW (ref >60)
GFR calc Af Amer: 13 mL/min — ABNORMAL LOW (ref >60)
GFR calc non Af Amer: 9 mL/min — ABNORMAL LOW (ref >60)
GFR calc non Af Amer: 11 mL/min — ABNORMAL LOW (ref >60)
GLUCOSE: 101 mg/dL — AB (ref 65–99)
GLUCOSE: 132 mg/dL — AB (ref 65–99)
PHOSPHORUS: 5.1 mg/dL — AB (ref 2.5–4.6)
Phosphorus: 4.8 mg/dL — ABNORMAL HIGH (ref 2.5–4.6)
Potassium: 4.2 mmol/L (ref 3.5–5.1)
Potassium: 4.4 mmol/L (ref 3.5–5.1)
SODIUM: 133 mmol/L — AB (ref 135–145)
SODIUM: 134 mmol/L — AB (ref 135–145)

## 2015-09-20 LAB — GLUCOSE, CAPILLARY
GLUCOSE-CAPILLARY: 88 mg/dL (ref 65–99)
Glucose-Capillary: 86 mg/dL (ref 65–99)
Glucose-Capillary: 56 mg/dL — ABNORMAL LOW (ref 65–99)

## 2015-09-20 LAB — CBC
HCT: 32.6 % — ABNORMAL LOW (ref 36.0–46.0)
HEMOGLOBIN: 10.1 g/dL — AB (ref 12.0–15.0)
MCH: 26.2 pg (ref 26.0–34.0)
MCHC: 31 g/dL (ref 30.0–36.0)
MCV: 84.5 fL (ref 78.0–100.0)
PLATELETS: 168 10*3/uL (ref 150–400)
RBC: 3.86 MIL/uL — AB (ref 3.87–5.11)
RDW: 18.9 % — ABNORMAL HIGH (ref 11.5–15.5)
WBC: 12.1 10*3/uL — ABNORMAL HIGH (ref 4.0–10.5)

## 2015-09-20 LAB — COMPREHENSIVE METABOLIC PANEL
ALK PHOS: 78 U/L (ref 38–126)
ALT: 37 U/L (ref 14–54)
ANION GAP: 9 (ref 5–15)
AST: 43 U/L — ABNORMAL HIGH (ref 15–41)
Albumin: 2.3 g/dL — ABNORMAL LOW (ref 3.5–5.0)
BILIRUBIN TOTAL: 1 mg/dL (ref 0.3–1.2)
BUN: 22 mg/dL — ABNORMAL HIGH (ref 6–20)
CALCIUM: 8.7 mg/dL — AB (ref 8.9–10.3)
CO2: 25 mmol/L (ref 22–32)
Chloride: 98 mmol/L — ABNORMAL LOW (ref 101–111)
Creatinine, Ser: 4.91 mg/dL — ABNORMAL HIGH (ref 0.44–1.00)
GFR calc non Af Amer: 9 mL/min — ABNORMAL LOW (ref >60)
GFR, EST AFRICAN AMERICAN: 10 mL/min — AB (ref >60)
GLUCOSE: 102 mg/dL — AB (ref 65–99)
Potassium: 4.2 mmol/L (ref 3.5–5.1)
Sodium: 132 mmol/L — ABNORMAL LOW (ref 135–145)
Total Protein: 6.6 g/dL (ref 6.5–8.1)

## 2015-09-20 LAB — APTT: aPTT: 38 seconds — ABNORMAL HIGH (ref 24–37)

## 2015-09-20 LAB — CORTISOL: Cortisol, Plasma: 21.7 ug/dL

## 2015-09-20 LAB — MAGNESIUM: Magnesium: 1.9 mg/dL (ref 1.7–2.4)

## 2015-09-20 MED ORDER — SODIUM CHLORIDE 0.9% FLUSH
10.0000 mL | Freq: Two times a day (BID) | INTRAVENOUS | Status: DC
  Administered 2015-09-20 – 2015-09-22 (×4): 10 mL

## 2015-09-20 MED ORDER — SODIUM CHLORIDE 0.9% FLUSH: 10.0000 mL | INTRAVENOUS | Status: DC | PRN

## 2015-09-20 MED ORDER — SODIUM CHLORIDE 0.9 % IV SOLN
INTRAVENOUS | Status: DC
  Administered 2015-09-20 – 2015-09-21 (×2): via INTRAVENOUS

## 2015-09-20 NOTE — Progress Notes (Signed)
[facility] Rounding Note Subjective:  Awakens, nods yes to SOB (but nods yes to all questions) On CRRT neg 100/hour not tolerating Just started back on levophed Clotted filter in 11 hours  Objective Vital signs in last 24 hours: Filed Vitals:   09/20/15 0705 09/20/15 0800 09/20/15 0900 09/20/15 1000  BP:  104/78 106/75 84/60  Pulse: 94     Temp:  96.5 F (35.8 C)    TempSrc:  Axillary    Resp: 22 27 31 28   Height:      Weight:      SpO2: 98%      Weight change: 1.1 kg (2 lb 6.8 oz)  Intake/Output Summary (Last 24 hours) at 09/20/15 1023 Last data filed at 09/20/15 1000  Gross per 24 hour  Intake  949.8 ml  Output   1072 ml  Net -122.2 ml   Physical Exam BP 84/60 mmHg  Pulse 94  Temp(Src) 96.5 F (35.8 C) (Axillary)  Resp 28  Ht 5\' 7"  (1.702 m)  Wt 78 kg (171 lb 15.3 oz)  BMI 26.93 kg/m2  SpO2 98%  Lethargic AAF difficult communication d/t aphasia R IJ temp cath in place (6/18) + JVD Tachy, regular S1S2 cannot hear murmur Abd non-distended but grimaces with palpation + ventral hernia 2+ edema both LE's LUA AVF +bruit w/bandaids still in place Does follow limited commands  Labs:  Recent Labs Lab 09/13/15 1555 09/16/15 1903 09/16/2015 1454 09/19/15 0049 09/20/15 0450  NA 131* 133* 133* 132* 132*  133*  K 4.8 4.4 4.8 4.7 4.2  4.2  CL 94* 95* 93* 96* 98*  99*  CO2 18* 24 26 24 25  25   GLUCOSE 33* 55* 76 114* 102*  101*  BUN 60* 20 17 21* 22*  21*  CREATININE 10.02* 5.29* 4.59* 5.22* 4.91*  4.77*  CALCIUM 8.6* 9.2 9.4 9.1 8.7*  8.7*  PHOS  --   --   --   --  5.1*     Recent Labs Lab 09/28/2015 1454 09/19/15 0049 09/20/15 0450  AST 119* 65* 43*  ALT 60* 43 37  ALKPHOS 103 78 78  BILITOT 0.9 0.8 1.0  PROT 8.3* 6.5 6.6  ALBUMIN 3.1* 2.3* 2.3*  2.2*    Recent Labs Lab 09/19/15 1318  AMMONIA 65*     Recent Labs Lab 09/13/15 1555 09/16/15 1903 09/08/2015 1620 09/19/15 0049 09/20/15 0450  WBC 6.6 10.5 14.9* 11.0* 12.1*  NEUTROABS 4.8  7.9* 12.4*  --   --   HGB 11.0* 11.9* 10.2* 10.3* 10.1*  HCT 34.4* 36.8 32.7* 32.6* 32.6*  MCV 84.1 84.8 84.3 84.7 84.5  PLT 187 214 194 166 168     Recent Labs Lab 09/09/2015 1454 09/17/2015 1620 09/27/2015 2206 09/19/15 0049 09/19/15 0846  CKTOTAL  --  74  --   --   --   TROPONINI 0.83*  --  0.60* 0.63* 0.68*     Recent Labs Lab 09/07/2015 1603 09/19/15 0856 09/19/15 2056 09/20/15 0838 09/20/15 0840  GLUCAP 57* 80 86 56* 88    Studies/Results: Dg Chest 2 View  09/16/2015  CLINICAL DATA:  Shortness of breath, chest pain. EXAM: CHEST  2 VIEW COMPARISON:  Radiographs of [place] 12, 2017. FINDINGS: Stable cardiomegaly. Minimal bilateral pleural effusions are noted. No pneumothorax is noted. No acute pulmonary disease is noted. Bony thorax is unremarkable. IMPRESSION: Stable cardiomegaly. Minimal bilateral pleural effusions. No other abnormality seen in the chest. Electronically Signed   By: Lupita RaiderJames  Green Jr,  M.D.   On: 09/08/2015 15:55   Ct Angio Chest Pe W Or Wo Contrast  09/24/2015  CLINICAL DATA:  Hypoxia.  Shortness of breath and chest pain. EXAM: CT ANGIOGRAPHY CHEST WITH CONTRAST TECHNIQUE: Multidetector CT imaging of the chest was performed using the standard protocol during bolus administration of intravenous contrast. Multiplanar CT image reconstructions and MIPs were obtained to evaluate the vascular anatomy. CONTRAST:  100 cc Isovue 370 IV COMPARISON:  Chest radiograph earlier this day. Most recent CT 07/29/2014 FINDINGS: There are no filling defects within the pulmonary arteries to suggest pulmonary embolus. Prominent main pulmonary artery measuring 3.5 cm, with prominent right and left pulmonary arteries. Upper limits normal ascending thoracic aorta measuring 3.5 cm. Moderate aortic atherosclerosis. Tortuosity is seen distally. No gross aortic dissection, phase of contrast limits assessment. Cardiomegaly with primarily right heart dilatation. Contrast refluxing into the hepatic  veins and IVC consistent with right heart failure. Contrast refluxes into the azygos system. Coronary artery calcifications are seen. No pericardial effusion. Small right pleural effusion. Minimal fluid in the left interlobar fissure. No pulmonary edema. Within the left lower lobe there is a 5 x 5 mm nodule image 67 series 7. This was not seen on previous exam. There is mild emphysema. No confluent airspace disease. Other than venous reflux, no acute abnormality in the included upper abdomen. There are no acute or suspicious osseous abnormalities. Review of the MIP images confirms the above findings. IMPRESSION: 1. No pulmonary embolus. 2. Findings consistent with right heart failure. Right heart dilatation with large volume contrast refluxing into the hepatic veins, IVC, and azygos system. Small right pleural effusion. 3. Left lower lobe 5 mm pulmonary nodule. No follow-up needed if patient is low-risk. Non-contrast chest CT can be considered in 12 months if patient is high-risk. This recommendation follows the consensus statement: Guidelines for Management of Incidental Pulmonary Nodules Detected on CT Images:From the Fleischner Society 2017; published online before print (10.1148/radiol.1610960454). Electronically Signed   By: Rubye Oaks M.D.   On: 09/14/2015 20:02   Dg Chest Port 1 View  09/19/2015  CLINICAL DATA:  Hemodialysis catheter placement. EXAM: PORTABLE CHEST 1 VIEW COMPARISON:  Chest radiograph [place] 17, 2017 FINDINGS: Cardiac silhouette is mildly enlarged unchanged. Tortuous mildly calcified aortic. Fullness of pulmonary hila better characterized on yesterday's CT chest. No pleural effusion or focal consolidation. Interval placement of RIGHT internal jugular central venous catheter with distal tip projecting in proximal superior vena cava. No pneumothorax. Soft tissue planes included osseous structure in unchanged. IMPRESSION: New RIGHT internal jugular central venous catheter projects in  proximal superior vena cava. No pneumothorax. Stable cardiomegaly. Electronically Signed   By: Awilda Metro M.D.   On: 09/19/2015 19:06   Medications: Infusions . DOBUTamine Stopped (09/19/15 1930)  . norepinephrine (LEVOPHED) Adult infusion Stopped (09/20/15 0009)  . dialysis replacement fluid (prismasate) 400 mL/hr at 09/19/15 2219  . dialysis replacement fluid (prismasate) 200 mL/hr at 09/19/15 2219  . dialysate (PRISMASATE) 1,750 mL/hr at 09/20/15 0141   . aspirin  325 mg Oral Daily  . budesonide (PULMICORT) nebulizer solution  0.25 mg Nebulization BID  . calcium acetate  667 mg Oral TID WC  . guaiFENesin  1,200 mg Oral BID  . ipratropium  0.5 mg Nebulization TID  . lactulose  10 g Oral Daily  . levalbuterol  0.63 mg Nebulization TID  . levETIRAcetam  500 mg Oral BID  . levothyroxine  25 mcg Oral QAC breakfast  . multivitamin  1 tablet Oral QHS  .  pantoprazole  40 mg Oral Daily  . piperacillin-tazobactam  3.375 g Intravenous Q6H  . sodium chloride flush  10-40 mL Intracatheter Q12H  . sodium chloride flush  3 mL Intravenous Q12H  . vancomycin  750 mg Intravenous Q24H   Dialysis: SW TTS 4h 75.5kg 3K/ 2.25 bath Hep none LUA AVF Mircera 225 last 6/13 (11.1, 15%, 997) Hect 6ug, phoslo 2ac (9.0/ 6.7, 330 pth) Dry wt recently dec'd from 76.5kg Missing about 1 session / week, stays on full rx when she comes  Background 60 y.o. year-old with hx of HTN, ESRD on HD 14 mos, hx CVA x 2 (in 2016) w/residual aphasia, afib/ PFO, HIT,  R HF / cor pulmonale w pulm HTN/PFO/.Usual HD at [facility] TTS. 6/17 was started on midodrine for low BP's (new issue for her).Presented with painful upper legs/edema.  Patient sedentary at home, can do about 1/2 of ADL's, uses walker/has expressive aphasia d/t prior CVA. Daughter takes her to HD. In ED 6/18 CXR negative, CT angio chest showed R HF w contrast reflux into IVC, no edema or PE. BNP high 4500. BP's low 80's requiring addition  pressors. Asked to see for ESRD - too unstable for HD. R IJ temp cath placed and pt on CRRT (6/18).   Assessment: 1. Bilat LE swelling/ painful edema of upper legs - no cellulitis.Still on ATB's  2. Chronic dCHF/pulm HTN/, decomp RHF.CT angio no PE. CXR 6/18  only mild pulm edema.  Need to know from cards if would be candidate for R heart cath/possible sildenafil. Intolerant so far of volume removal. See #3.   3. ESRD (usual HD TTS AF via L AVF). CRRT now d/t hypotension. Not tolerating neg 100/hour off - required re-initiation of levophed. Will back off on UFR from neg 100/hour to neg 0-50/hour as BP tolerates. Cards has seen and says "try to keep vol reasonable" but as predicted attempt to remove fluid has thus far caused worsening hypotension. Wt 78 (EDW 76.5). K and phos are OK right now. 4. Hypotension - just started on midodrine prior to current admission - low BP new issue. Currently requiring levophed.  5. Hx CVA's w/ residual aphasia 6. PAF 7. Anemia Hb 10.1. On max mircera 220 as outpt. Start weekly Aranesp 200.  8. Sec HPT - hectorol/binders (not awake enough for po right now) 9. HIT + 10. Disposition - prognosis here (in my mind) would seem rather poor   Camille Bal, MD [facility] [phone] pager 09/20/2015, 10:23 AM

## 2015-09-20 NOTE — Evaluation (Signed)
Clinical/Bedside Swallow Evaluation Patient Details  Name: [name] MRN: [id number] Date of Birth: February 24, 1956  Today's Date: 09/20/2015 Time: SLP Start Time (ACUTE ONLY): 1038 SLP Stop Time (ACUTE ONLY): 1054 SLP Time Calculation (min) (ACUTE ONLY): 16 min  Past Medical History:  Past Medical History  Diagnosis Date  . Asthma   . Hypertension   . Gout   . Arthritis   . Insomnia   . Depression   . Aortic aneurysm without rupture (HCC)     3cm by CT 08/03/14  . Paroxysmal SVT (supraventricular tachycardia) (HCC)   . Cor pulmonale (HCC)   . PFO (patent foramen ovale)   . COPD (chronic obstructive pulmonary disease) (HCC)   . Shortness of breath dyspnea     on home O2 at 2 L  . Stroke [facility]) 06/2014, 11/2014    still has difficulty getting out some words  . Thyroid disease   . Anxiety   . Chronic kidney disease     dialysis on T/Th/Sa  . Renal insufficiency    Past Surgical History:  Past Surgical History  Procedure Laterality Date  . Multiple tooth extractions    . Av fistula placement Left 01/28/2014    Procedure: ARTERIOVENOUS (AV) FISTULA CREATION;  Surgeon: Sherren Kerns, MD;  Location: [facility] OR;  Service: Vascular;  Laterality: Left;  . Tee without cardioversion N/A 06/23/2014    Procedure: TRANSESOPHAGEAL ECHOCARDIOGRAM (TEE);  Surgeon: Lewayne Bunting, MD;  Location: [facility] ENDOSCOPY;  Service: Cardiovascular;  Laterality: N/A;  . Insertion of dialysis catheter Right 06/27/2014    Procedure: INSERTION OF Right Internal Jugular DIATEK CATHETER;  Surgeon: Pryor Ochoa, MD;  Location: [facility] OR;  Service: Vascular;  Laterality: Right;  . Laparotomy N/A 07/01/2014    Procedure: EXPLORATORY LAPAROTOMY WITH CLOSURE OF PERFORATED PYLORIC ULCER;  Surgeon: Claud Kelp, MD;  Location: [facility] OR;  Service: General;  Laterality: N/A;  . Bowel resection N/A 07/01/2014    Procedure: SMALL BOWEL RESECTION;  Surgeon: Claud Kelp, MD;  Location: [facility] OR;  Service: General;  Laterality: N/A;   . 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 Resection of arteriovenous fistula aneurysm Left 07/09/2015    Procedure: LEFT UPPER ARM  ARTERIOVENOUS FISTULA  ANEURYSMORRPHY;  Surgeon: Pryor Ochoa, MD;  Location: [facility] [place] OR;  Service: Vascular;  Laterality: Left;   HPI:  69F hx ESRD on HD, prior CVA with residual aphasia, asthma, hypothyroidism, paroxysmal atrial fibrillation/flutter on ASA, patent foramen ovale, HTN, chronic diastolic heart failure, likely pulmonary HTN, presenting with shortness of breath, wheezing and bilateral hip/leg pain. DX with cardiogenic shock, sepsis. On CRRT. History of dysphagia in 2016 s/p CVA however MBS 03/03/15 recommended dysphagia 3 diet with thin liquids with intact airway protection.    Assessment / Plan / Recommendation Clinical Impression  Patient presents with a functional oropharyngeal swallow without observed s/s of aspiration. No SLP f/u indicated at this time.     Aspiration Risk  Mild aspiration risk (due to lethargy)    Diet Recommendation Regular;Thin liquid   Liquid Administration via: Cup;Straw Medication Administration: Whole meds with liquid Supervision: Patient able to self feed Compensations: Slow rate;Small sips/bites Postural Changes: Seated upright at 90 degrees    Other  Recommendations Oral Care Recommendations: Oral care BID   Follow  up Recommendations  None               Swallow Study   General HPI: 59F hx ESRD on HD, prior CVA with residual aphasia, asthma, hypothyroidism, paroxysmal atrial fibrillation/flutter on ASA, patent foramen ovale, HTN, chronic diastolic heart failure, likely pulmonary HTN, presenting with shortness of breath, wheezing and bilateral hip/leg pain. DX with cardiogenic shock, sepsis. On CRRT.  History of dysphagia in 2016 s/p CVA however MBS 03/03/15 recommended dysphagia 3 diet with thin liquids with intact airway protection.  Type of Study: Bedside Swallow Evaluation Previous Swallow Assessment: see HPI Diet Prior to this Study: NPO Temperature Spikes Noted: No Respiratory Status: Nasal cannula History of Recent Intubation: No Behavior/Cognition: Lethargic/Drowsy (aphasic) Oral Cavity Assessment: Within Functional Limits Oral Care Completed by SLP: Yes Oral Cavity - Dentition: Dentures, top;Dentures, bottom Vision: Functional for self-feeding Self-Feeding Abilities: Needs assist Patient Positioning: Upright in bed Baseline Vocal Quality: Normal Volitional Cough: Cognitively unable to elicit Volitional Swallow: Able to elicit    Oral/Motor/Sensory Function Overall Oral Motor/Sensory Function: Within functional limits   Ice Chips Ice chips: Within functional limits Presentation: Spoon   Thin Liquid Thin Liquid: Within functional limits Presentation: Cup;Straw    Nectar Thick Nectar Thick Liquid: Not tested   Honey Thick Honey Thick Liquid: Not tested   Puree Puree: Within functional limits Presentation: Spoon   Solid   GO  Aryn Kops MA, CCC-SLP [phone]  Solid: Within functional limits        Charlestine Rookstool Meryl 09/20/2015,1:19 PM

## 2015-09-20 NOTE — Progress Notes (Signed)
CRRT Note R IJ temp cath 400/200/1750 All 4K Intol of neg 100/hour UFR backed off to neg 0-50. Back on levophed.  Camille Balynthia Janet Humphreys, MD [facility] [phone] Pager 09/20/2015, 11:00 AM

## 2015-09-20 NOTE — Progress Notes (Signed)
Advanced Heart Failure Rounding Note   Subjective:    Did not tolerate milrinone and NO yesterday due to hypotension.   Moved to CCU and trialysis catheter placed. Treated with norepi and dobutamine but these are now off.   She feels better. SBP 130s   Objective:   Weight Range:  Vital Signs:   Temp:  [96.5 F (35.8 C)-98.4 F (36.9 C)] 96.5 F (35.8 C) (06/19 0800) Pulse Rate:  [94-96] 94 (06/19 0705) Resp:  [17-26] 22 (06/19 0705) BP: (76-141)/(57-103) 96/80 mmHg (06/19 0600) SpO2:  [90 %-98 %] 98 % (06/19 0705) Weight:  [78 kg (171 lb 15.3 oz)-78.1 kg (172 lb 2.9 oz)] 78 kg (171 lb 15.3 oz) (06/19 0500) Last BM Date: 2015-09-04  Weight change: Filed Weights   2015-09-04 2100 09/19/15 2100 09/20/15 0500  Weight: 77 kg (169 lb 12.1 oz) 78.1 kg (172 lb 2.9 oz) 78 kg (171 lb 15.3 oz)    Intake/Output:   Intake/Output Summary (Last 24 hours) at 09/20/15 0910 Last data filed at 09/20/15 0859  Gross per 24 hour  Intake  909.8 ml  Output    989 ml  Net  -79.2 ml     Physical Exam: General:  Weak appearing. Lying in bed. Apashic HEENT: normal Neck: supple. JVP to jaw  RIJ trialysis catheter.  Cor: Marked RV heave. RRR. R sided s3 2/6 SEM RUSB Lungs: decreased throughout Abdomen: soft, nontender, mildly distended. marked hepatosplenomegaly with pulsatile liver Extremities: no cyanosis, clubbing, rash, 1+ edema Neuro: awake alert. apahsic  Telemetry: NSR  Labs: Basic Metabolic Panel:  Recent Labs Lab 09/13/15 1555 09/16/15 1903 2015-09-04 1454 2015-09-04 2206 09/19/15 0049 09/20/15 0450  NA 131* 133* 133*  --  132* 132*  133*  K 4.8 4.4 4.8  --  4.7 4.2  4.2  CL 94* 95* 93*  --  96* 98*  99*  CO2 18* 24 26  --  24 25  25   GLUCOSE 33* 55* 76  --  114* 102*  101*  BUN 60* 20 17  --  21* 22*  21*  CREATININE 10.02* 5.29* 4.59*  --  5.22* 4.91*  4.77*  CALCIUM 8.6* 9.2 9.4  --  9.1 8.7*  8.7*  MG  --   --   --  1.6*  --  1.9  PHOS  --   --   --   --    --  5.1*    Liver Function Tests:  Recent Labs Lab 09/16/15 1903 2015-09-04 1454 09/19/15 0049 09/20/15 0450  AST 47* 119* 65* 43*  ALT 25 60* 43 37  ALKPHOS 90 103 78 78  BILITOT 1.3* 0.9 0.8 1.0  PROT 8.3* 8.3* 6.5 6.6  ALBUMIN 3.1* 3.1* 2.3* 2.3*  2.2*   No results for input(s): LIPASE, AMYLASE in the last 168 hours.  Recent Labs Lab 09/19/15 1318  AMMONIA 65*    CBC:  Recent Labs Lab 09/13/15 1555 09/16/15 1903 2015-09-04 1620 09/19/15 0049 09/20/15 0450  WBC 6.6 10.5 14.9* 11.0* 12.1*  NEUTROABS 4.8 7.9* 12.4*  --   --   HGB 11.0* 11.9* 10.2* 10.3* 10.1*  HCT 34.4* 36.8 32.7* 32.6* 32.6*  MCV 84.1 84.8 84.3 84.7 84.5  PLT 187 214 194 166 168    Cardiac Enzymes:  Recent Labs Lab 2015-09-04 1454 2015-09-04 1620 2015-09-04 2206 09/19/15 0049 09/19/15 0846  CKTOTAL  --  74  --   --   --   TROPONINI 0.83*  --  0.60* 0.63* 0.68*    BNP: BNP (last 3 results)  Recent Labs  09/17/2015 1454 09/19/15 0546  BNP >4500.0* 4125.6*    ProBNP (last 3 results) No results for input(s): PROBNP in the last 8760 hours.    Other results:  Imaging: Dg Chest 2 View  09/07/2015  CLINICAL DATA:  Shortness of breath, chest pain. EXAM: CHEST  2 VIEW COMPARISON:  Radiographs of [name] 12, 2017. FINDINGS: Stable cardiomegaly. Minimal bilateral pleural effusions are noted. No pneumothorax is noted. No acute pulmonary disease is noted. Bony thorax is unremarkable. IMPRESSION: Stable cardiomegaly. Minimal bilateral pleural effusions. No other abnormality seen in the chest. Electronically Signed   By: Lupita Raider, M.D.   On: 09/30/2015 15:55   Ct Angio Chest Pe W Or Wo Contrast  09/19/2015  CLINICAL DATA:  Hypoxia.  Shortness of breath and chest pain. EXAM: CT ANGIOGRAPHY CHEST WITH CONTRAST TECHNIQUE: Multidetector CT imaging of the chest was performed using the standard protocol during bolus administration of intravenous contrast. Multiplanar CT image reconstructions and  MIPs were obtained to evaluate the vascular anatomy. CONTRAST:  100 cc Isovue 370 IV COMPARISON:  Chest radiograph earlier this day. Most recent CT 07/29/2014 FINDINGS: There are no filling defects within the pulmonary arteries to suggest pulmonary embolus. Prominent main pulmonary artery measuring 3.5 cm, with prominent right and left pulmonary arteries. Upper limits normal ascending thoracic aorta measuring 3.5 cm. Moderate aortic atherosclerosis. Tortuosity is seen distally. No gross aortic dissection, phase of contrast limits assessment. Cardiomegaly with primarily right heart dilatation. Contrast refluxing into the hepatic veins and IVC consistent with right heart failure. Contrast refluxes into the azygos system. Coronary artery calcifications are seen. No pericardial effusion. Small right pleural effusion. Minimal fluid in the left interlobar fissure. No pulmonary edema. Within the left lower lobe there is a 5 x 5 mm nodule image 67 series 7. This was not seen on previous exam. There is mild emphysema. No confluent airspace disease. Other than venous reflux, no acute abnormality in the included upper abdomen. There are no acute or suspicious osseous abnormalities. Review of the MIP images confirms the above findings. IMPRESSION: 1. No pulmonary embolus. 2. Findings consistent with right heart failure. Right heart dilatation with large volume contrast refluxing into the hepatic veins, IVC, and azygos system. Small right pleural effusion. 3. Left lower lobe 5 mm pulmonary nodule. No follow-up needed if patient is low-risk. Non-contrast chest CT can be considered in 12 months if patient is high-risk. This recommendation follows the consensus statement: Guidelines for Management of Incidental Pulmonary Nodules Detected on CT Images:From the Fleischner Society 2017; published online before print (10.1148/radiol.0981191478). Electronically Signed   By: Rubye Oaks M.D.   On: 09/27/2015 20:02   Dg Chest Port  1 View  09/19/2015  CLINICAL DATA:  Hemodialysis catheter placement. EXAM: PORTABLE CHEST 1 VIEW COMPARISON:  Chest radiograph Annie 17, 2017 FINDINGS: Cardiac silhouette is mildly enlarged unchanged. Tortuous mildly calcified aortic. Fullness of pulmonary hila better characterized on yesterday's CT chest. No pleural effusion or focal consolidation. Interval placement of RIGHT internal jugular central venous catheter with distal tip projecting in proximal superior vena cava. No pneumothorax. Soft tissue planes included osseous structure in unchanged. IMPRESSION: New RIGHT internal jugular central venous catheter projects in proximal superior vena cava. No pneumothorax. Stable cardiomegaly. Electronically Signed   By: Awilda Metro M.D.   On: 09/19/2015 19:06      Medications:     Scheduled Medications: . aspirin  325  mg Oral Daily  . budesonide (PULMICORT) nebulizer solution  0.25 mg Nebulization BID  . calcium acetate  667 mg Oral TID WC  . guaiFENesin  1,200 mg Oral BID  . ipratropium  0.5 mg Nebulization TID  . lactulose  10 g Oral Daily  . levalbuterol  0.63 mg Nebulization TID  . levETIRAcetam  500 mg Oral BID  . levothyroxine  25 mcg Oral QAC breakfast  . multivitamin  1 tablet Oral QHS  . pantoprazole  40 mg Oral Daily  . piperacillin-tazobactam  3.375 g Intravenous Q6H  . sodium chloride flush  10-40 mL Intracatheter Q12H  . sodium chloride flush  3 mL Intravenous Q12H  . vancomycin  750 mg Intravenous Q24H     Infusions: . DOBUTamine Stopped (09/19/15 1930)  . norepinephrine (LEVOPHED) Adult infusion Stopped (09/20/15 0009)  . dialysis replacement fluid (prismasate) 400 mL/hr at 09/19/15 2219  . dialysis replacement fluid (prismasate) 200 mL/hr at 09/19/15 2219  . dialysate (PRISMASATE) 1,750 mL/hr at 09/20/15 0141     PRN Medications:  sodium chloride, ALPRAZolam, alteplase, anticoagulant sodium citrate, anticoagulant sodium citrate, levalbuterol, loperamide,  ondansetron **OR** ondansetron (ZOFRAN) IV, sodium chloride, sodium chloride flush, sodium chloride flush, zolpidem   Assessment:   1. Acute on chronic cor pulmonale 2. Severe PAH 3. ESRD 4. Cardiogenic shock 5. PAF 6. H/o CVA with expressive aphasia 7. COPD 8. DNR/DNI   Plan/Discussion:     She is improved with CVVHD. Long term options quite limited. She did not tolerate milrinone or inhaled nitric oxide. Could try low dose sildenafil 20 TID but I am not sure her BP will tolerate particularly when she switched back to iHD.   At this point we are not adding much. I have d/w Dr. Tyson Alias. HF team will sign off. Please reconsult if we can help.      Length of Stay: 2  Arvilla Meres MD 09/20/2015, 9:10 AM  Advanced Heart Failure Team Pager [phone] (M-F; 7a - 4p)  Please contact [facility] for night-coverage after hours (4p -7a ) and weekends on [email or web address]

## 2015-09-20 NOTE — Progress Notes (Signed)
PT Cancellation Note  Patient Details Name: [name] MRN: [id number] DOB: Jul 23, 1955   Cancelled Treatment:    Reason Eval Not Completed: Medical issues which prohibited therapy. Patient moved to ICU and now on CVVHD. Please re-order PT when pt medically appropriate.   Sherol Sabas 09/20/2015, 9:52 AM  Pager [phone]

## 2015-09-20 NOTE — Progress Notes (Signed)
Daily Progress Note   Patient Name: [name]       Date: 09/20/2015 DOB: [birth year]  Age: 60 y.o. MRN#: [id number] Attending Physician: Alyson Reedy, MD Primary Care Physician: Myrlene Broker, MD Admit Date: 09/28/2015  Reason for Consultation/Follow-up: Establishing goals of care   [name] is a 42F with prior history of stroke with resultant aphasia, chronic hypoxemic respiratory failure on 2L O2 continuously, asthma on pulmicort and duonebs, RV dilation on TTE and known chronic diastolic dysfunction, ESRD on TTS HD, HTN, hypothyroidism and recently diagnosed pAfib/flutter on ASA. She presented with bilateral hip/leg pain that seems related to marked soft tissue swelling and induration in this region. Cellulitis considered. Her shortness of breath is reportedly at baseline and she is on her home O2 requirement. She is now in cardiogenic shock, currently on Levophed. Also now on CRRT. Now DNR/DNI.   Subjective:  appears weak chronically ill Daughter not currently at bedside, agree with establishing DNR DNI  Length of Stay: 2  Current Medications: Scheduled Meds:  . aspirin  325 mg Oral Daily  . budesonide (PULMICORT) nebulizer solution  0.25 mg Nebulization BID  . calcium acetate  667 mg Oral TID WC  . guaiFENesin  1,200 mg Oral BID  . ipratropium  0.5 mg Nebulization TID  . lactulose  10 g Oral Daily  . levalbuterol  0.63 mg Nebulization TID  . levETIRAcetam  500 mg Oral BID  . levothyroxine  25 mcg Oral QAC breakfast  . multivitamin  1 tablet Oral QHS  . pantoprazole  40 mg Oral Daily  . piperacillin-tazobactam  3.375 g Intravenous Q6H  . sodium chloride flush  10-40 mL Intracatheter Q12H  . sodium chloride flush  3 mL Intravenous Q12H  . vancomycin  750 mg  Intravenous Q24H    Continuous Infusions: . DOBUTamine Stopped (09/19/15 1930)  . norepinephrine (LEVOPHED) Adult infusion Stopped (09/20/15 0009)  . dialysis replacement fluid (prismasate) 400 mL/hr at 09/19/15 2219  . dialysis replacement fluid (prismasate) 200 mL/hr at 09/19/15 2219  . dialysate (PRISMASATE) 1,750 mL/hr at 09/20/15 0141    PRN Meds: sodium chloride, ALPRAZolam, alteplase, anticoagulant sodium citrate, anticoagulant sodium citrate, levalbuterol, loperamide, ondansetron **OR** ondansetron (ZOFRAN) IV, sodium chloride, sodium chloride flush, sodium chloride flush, zolpidem  Physical Exam  Weak chronically ill appearing Edema Has catheter in her neck Diminished breath sounds S1 S2  Vital Signs: BP 99/76 mmHg  Pulse 94  Temp(Src) 96.5 F (35.8 C) (Axillary)  Resp 30  Ht 5\' 7"  (1.702 m)  Wt 78 kg (171 lb 15.3 oz)  BMI 26.93 kg/m2  SpO2 98% SpO2: SpO2: 98 % O2 Device: O2 Device: Nasal Cannula O2 Flow Rate: O2 Flow Rate (L/min): 4 L/min  Intake/output summary:  Intake/Output Summary (Last 24 hours) at 09/20/15 1107 Last data filed at 09/20/15 1000  Gross per 24 hour  Intake  949.8 ml  Output   1072 ml  Net -122.2 ml   LBM: Last BM Date: 09/02/2015 Baseline Weight: Weight: 77 kg (169 lb 12.1 oz) Most recent weight: Weight: 78 kg (171 lb 15.3 oz)       Palliative Assessment/Data:    Flowsheet Rows        Most Recent Value   Intake Tab    Referral Department  Critical care   Unit at Time of Referral  ICU   Palliative Care Primary Diagnosis  Cardiac   Palliative Care Type  Return patient Palliative Care   Reason for referral  Clarify Goals of Care   Date first seen by Palliative Care  09/19/15   Clinical Assessment    Palliative Performance Scale Score  30%   Pain Max last 24 hours  5   Pain Min Last 24 hours  4   Dyspnea Max Last 24 Hours  6   Dyspnea Min Last 24 hours  5   Psychosocial & Spiritual Assessment    Palliative Care Outcomes     Patient/Family meeting held?  Yes   Who was at the meeting?  patient    Palliative Care Outcomes  Clarified goals of care      Patient Active Problem List   Diagnosis Date Noted  . Pressure ulcer 09/19/2015  . Hypoxemia   . Asthma   . Acute pulmonary edema (HCC)   . PAH (pulmonary artery hypertension) (HCC)   . Acute cor pulmonale (HCC)   . Acute on chronic diastolic heart failure (HCC)   . Anasarca associated with disorder of kidney   . Encounter for palliative care   . Goals of care, counseling/discussion   . Cardiogenic shock (HCC)   . Volume overload 09/25/2015  . Elevated troponin 09/27/2015  . Hypotension 09/09/2015  . Hypoxia 09/03/2015  . Leukocytosis 09/12/2015  . Anemia 09/19/2015  . ESRD on dialysis (HCC) 07/27/2015  . 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 Right hemiparesis (HCC) 11/25/2014  . Aphasia due to stroke 11/25/2014  . DNAR (do not attempt resuscitation) 11/20/2014  . Paroxysmal a-fib (HCC) 11/16/2014  . HLD (hyperlipidemia) 11/16/2014  . Essential hypertension   . PFO (patent foramen ovale)   . Chronic ischemic colitis (HCC) 09/25/2014  . Left hemiparesis (HCC) 09/11/2014  . History of ETT   . Aortic aneurysm (HCC) 08/13/2014  . Duodenal anastomotic leak   . Protein calorie malnutrition (HCC)   . Right heart failure (HCC)   . 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 Fistula   . History of intermediate V/Q scan   . Other emphysema (HCC)   . Abnormal TSH   . Enteritis due to Clostridium difficile 07/19/2014  . Perforated gastric ulcer (HCC) 07/01/2014  . Stroke (HCC)   . Hyperlipidemia   . Cerebral thrombosis with cerebral infarction (HCC) 06/20/2014  . Hypertension 06/20/2014  . ESRD needing  dialysis (HCC) 06/20/2014  . Obesity (BMI 30-39.9) 06/20/2014  . Depression 06/20/2014  . Left renal mass 06/20/2014  . Chronic diastolic heart failure (HCC) 06/20/2014  . Tobacco use disorder 06/20/2014     Palliative Care Assessment & Plan   Patient Profile:    Assessment:  cardiogenic shock Has R heart failure ESRD History of stroke   Recommendations/Plan:   Agree with DNR DNI. Remains on pressors and CRRT for now. Continue to support patient and daughter, help with appropriate decision making. Patient critically ill, possible hospital death, limited prognosis. Continue to follow along and support.     Code Status:    Code Status Orders        Start     Ordered   09/19/15 1741  Limited resuscitation (code)   Continuous    Question Answer Comment  In the event of cardiac or respiratory ARREST: Initiate Code Blue, Call Rapid Response Yes   In the event of cardiac or respiratory ARREST: Perform CPR No   In the event of cardiac or respiratory ARREST: Perform Intubation/Mechanical Ventilation No   In the event of cardiac or respiratory ARREST: Use NIPPV/BiPAp only if indicated Yes   In the event of cardiac or respiratory ARREST: Administer ACLS medications if indicated Yes   In the event of cardiac or respiratory ARREST: Perform Defibrillation or Cardioversion if indicated No      09/19/15 1740    Code Status History    Date Active Date Inactive Code Status Order ID Comments User Context   09/19/2015  9:07 PM 09/19/2015  5:40 PM Full Code [id number]  Rodolph Bong, MD Inpatient   11/25/2014  4:53 PM 11/25/2014  4:53 PM Full Code [id number]  Charlton Amor, PA-C Inpatient   11/25/2014  4:53 PM 12/24/2014  4:34 PM DNR [id number]  Mcarthur Rossetti Angiulli, PA-C Inpatient   11/21/2014  8:37 AM 11/25/2014  4:53 PM DNR [id number]  Lupita Leash, MD Inpatient   11/20/2014  7:05 PM 11/21/2014  8:37 AM Partial Code [id number]  Sol Passer, RN Inpatient   11/20/2014 12:07 PM 11/20/2014  7:05 PM Partial Code [id number]  Kalman Shan, MD Inpatient   11/20/2014 11:38 AM 11/20/2014 12:07 PM DNR [id number]  Simonne Martinet, NP Inpatient   11/18/2014  2:36 PM 11/20/2014 11:38 AM Partial Code [id number]   Alyson Reedy, MD Inpatient   11/10/2014  2:40 PM 11/18/2014  2:36 PM Full Code [id number]  Ulice Dash, PA-C Inpatient   09/11/2014  4:50 PM 10/02/2014  2:25 PM Full Code [id number]  Charlton Amor, PA-C Inpatient   08/17/2014  7:27 PM 09/11/2014  4:50 PM Full Code [id number]  Richarda Overlie, MD Inpatient   08/12/2014 11:28 PM 08/17/2014  7:27 PM Full Code [id number]  Oley Balm, MD Inpatient   08/03/2014  9:42 PM 08/12/2014 11:28 PM Full Code [id number]  Berdine Dance, MD Inpatient   07/10/2014  3:55 PM 08/03/2014  9:42 PM Full Code [id number]  Berdine Dance, MD Inpatient   07/01/2014  5:53 PM 07/10/2014  3:55 PM Full Code [id number]  Claud Kelp, MD Inpatient   06/17/2014  8:41 PM 07/01/2014  5:53 PM Full Code [id number]  Lupita Leash, MD ED    Advance Directive Documentation        Most Recent Value   Type of Advance Directive  Healthcare Power of Attorney   Pre-existing out of facility DNR order (yellow form or pink MOST form)     "MOST" Form  in Place?         Prognosis:   < 2 weeks  Discharge Planning:    Hospital Death appears very likely. Continue to monitor trajectory and support the family   Care plan was discussed with patient    Thank you for allowing the Palliative Medicine Team to assist in the care of this patient.   Time In: 10 Time Out: 1025 Total Time 25 Prolonged Time Billed  no       Greater than 50%  of this time was spent counseling and coordinating care related to the above assessment and plan.  Rosalin Hawking, MD [phone]  Please contact Palliative Medicine Team phone at [phone] for questions and concerns.

## 2015-09-20 NOTE — Progress Notes (Signed)
PULMONARY / CRITICAL CARE MEDICINE   Name: [name] MRN: [id number] DOB: 24-Jan-1956    ADMISSION DATE:  09/05/2015 CONSULTATION DATE:  09/23/2015  REFERRING MD:  Triad  CHIEF COMPLAINT: bilateral leg/hip pain  HISTORY OF PRESENT ILLNESS:   48F hx ESRD on HD, prior CVA with residual aphasia, asthma on pulmicort and duoNebs, hypothyroidism, paroxysmal atrial fibrillation/flutter on ASA, patent foramen ovale, HTN, chronic diastolic heart failure, likely pulmonary HTN with RVSP 86 in 11/2014 (dilated RV and RA at that time) presenting with shortness of breath, wheezing and bilateral hip/leg pain.  PCCM asked to see for cardiogenic shock  SUBJECTIVE:  No overnight events. BP low this AM.   VITAL SIGNS: BP 84/60 mmHg  Pulse 94  Temp(Src) 96.5 F (35.8 C) (Axillary)  Resp 28  Ht 5\' 7"  (1.702 m)  Wt 171 lb 15.3 oz (78 kg)  BMI 26.93 kg/m2  SpO2 98%   INTAKE / OUTPUT: I/O last 3 completed shifts: In: 2219.8 [P.O.:990; I.V.:329.8; IV Piggyback:900] Out: 858 [Other:858]  PHYSICAL EXAMINATION:  General Awake, alert, NAD.   HEENT PERRL, EOMI. Moist mucus membranes.   Pulmonary Scattered rhonchi, Poor effort, symmetrical expansion.   Cardiovascular Tachy . Regular rhythm. S1, s2. No m/r/g. PMI displaced rightward with marked RV heave noted. Distal pulses palpable. LUE AVF with palpable thrill  Abdomen Soft, non-tender, non-distended, positive bowel sounds, no palpable organomegaly or masses. Ventral hernia easily reduced. Midline scar and prior PEG site scar. Normoresonant to percussion.  Musculoskeletal Normal tone, moves all extremities. Grossly normal.   Lymphatics No cervical, supraclavicular or axillary adenopathy.   Neurologic Expressive aphasia with some delay in receptive comprehension. Follows some commands. Moves all extremities spontaneously.   Skin/Integuement No rash, no cyanosis, no clubbing. Tender to palpation over greater trochanter region bilaterally with marked  induration and soft tissue swelling extending around to her sacrum. No drainage. Slightly warm to touch. No erythema noted.    LABS:  BMET  Recent Labs Lab 09/25/2015 1454 09/19/15 0049 09/20/15 0450  NA 133* 132* 132*  133*  K 4.8 4.7 4.2  4.2  CL 93* 96* 98*  99*  CO2 26 24 25  25   BUN 17 21* 22*  21*  CREATININE 4.59* 5.22* 4.91*  4.77*  GLUCOSE 76 114* 102*  101*   Electrolytes  Recent Labs Lab 09/25/2015 1454 09/19/2015 2206 09/19/15 0049 09/20/15 0450  CALCIUM 9.4  --  9.1 8.7*  8.7*  MG  --  1.6*  --  1.9  PHOS  --   --   --  5.1*   CBC  Recent Labs Lab 09/24/2015 1620 09/19/15 0049 09/20/15 0450  WBC 14.9* 11.0* 12.1*  HGB 10.2* 10.3* 10.1*  HCT 32.7* 32.6* 32.6*  PLT 194 166 168    Coag's  Recent Labs Lab 09/14/2015 1818 09/19/15 0049 09/20/15 0450  APTT 40* 38* 38*  INR 1.62* 1.56*  --    Sepsis Markers  Recent Labs Lab 09/17/2015 1818  09/19/15 0546 09/19/15 1622 09/19/15 2110  LATICACIDVEN  --   < > 1.8 1.7 0.9  PROCALCITON 7.77  --   --   --   --   < > = values in this interval not displayed.  ABG  Recent Labs Lab 09/17/2015 1735  PHART 7.341*  PCO2ART 49.2*  PO2ART 49.0*   Liver Enzymes  Recent Labs Lab 09/21/2015 1454 09/19/15 0049 09/20/15 0450  AST 119* 65* 43*  ALT 60* 43 37  ALKPHOS 103 78 78  BILITOT 0.9 0.8 1.0  ALBUMIN 3.1* 2.3* 2.3*  2.2*   Cardiac Enzymes  Recent Labs Lab 2015-09-27 2206 09/19/15 0049 09/19/15 0846  TROPONINI 0.60* 0.63* 0.68*   Glucose  Recent Labs Lab 09/17/15 0144 27-Sep-2015 1603 09/19/15 0856 09/19/15 2056 09/20/15 0838 09/20/15 0840  GLUCAP 82 57* 80 86 56* 88   Imaging Dg Chest Port 1 View  09/19/2015  CLINICAL DATA:  Hemodialysis catheter placement. EXAM: PORTABLE CHEST 1 VIEW COMPARISON:  Chest radiograph 27-Sep-2015 FINDINGS: Cardiac silhouette is mildly enlarged unchanged. Tortuous mildly calcified aortic. Fullness of pulmonary hila better characterized on  yesterday's CT chest. No pleural effusion or focal consolidation. Interval placement of RIGHT internal jugular central venous catheter with distal tip projecting in proximal superior vena cava. No pneumothorax. Soft tissue planes included osseous structure in unchanged. IMPRESSION: New RIGHT internal jugular central venous catheter projects in proximal superior vena cava. No pneumothorax. Stable cardiomegaly. Electronically Signed   By: Awilda Metro M.D.   On: 09/19/2015 19:06     STUDIES:  CXR as above CT Angio Chest 6/17 >> Suggestive of Right Heart Failure, right pleural effusion, pulmonary nodule. NO PE.   CULTURES: Blood cx 6/17 >>  ANTIBIOTICS: Vancomycin 6/17 >>6/20 Zosyn 6/17 >>  SIGNIFICANT EVENTS: 6/17 >> Admit 6/20-remains on pressors  LINES/TUBES: RIJ HD cath 6/18 >>  DISCUSSION: [name] is a 37F with prior history of stroke with resultant aphasia, chronic hypoxemic respiratory failure on 2L O2 continuously, asthma on pulmicort and duonebs, RV dilation on TTE and known chronic diastolic dysfunction, ESRD on TTS HD, HTN, hypothyroidism and recently diagnosed pAfib/flutter on ASA. She presented with bilateral hip/leg pain that seems related to marked soft tissue swelling and induration in this region. Cellulitis considered. Her shortness of breath is reportedly at baseline and she is on her home O2 requirement. She is now in cardiogenic shock, currently on Levophed. Now DNR/DNI.   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 P:   Continue supplemental O2 Continue home asthma regimen (pulmicort + DuoNebs) Volume removal as tolerated by blood pressure DNR/DNI (meds and NIPPV okay)  CARDIOVASCULAR A:  Tachycardia Paroxysmal a fib/flutter on ASA RV dilation with severe TR and elevated RVSP on prior echo and CT findings consistent with RH failure (pHTN  likely WHO group II) Cardiogenic shock P:  Supportive care Levophed gtt Volume removal w/ CRRT per renal Additional recs per cardiology  RENAL A:   ESRD on TTS HD P:   CRRT per renal  GASTROINTESTINAL A:   No acute issues P:   PPI  HEMATOLOGIC A:   Chronic anemia related to ESRD P:  Per renal  INFECTIOUS A:   Leukocytosis P:   Follow cultures Continue vancomycin and zosyn  ENDOCRINE A:   Hypothyroidism P:   Continue Synthroid  NEUROLOGIC A:   Prior CVA with residual aphasia on ASA P:   Hold sedating meds    Lauris Chroman, MD PGY-3, Internal Medicine Pager: [phone]   STAFF NOTE: Cindi Carbon, MD FACP have personally reviewed patient's available data, including medical history, events of note, physical examination and test results as part of my evaluation. I have discussed with resident/NP and other care providers such as pharmacist, RN and RRT. In addition, I personally evaluated patient and elicited key findings of: no distress, some weakness, lungs clear, cvvhd, remains on low dose levophed, prognosis is poor, we should consider  full comfort crae or at min no restart of any pressors once off pressors, dc vanc, consider dc all abx, would NOT repeat svo2, would titrate levophed to sys 90, consider cortisol level, less volume removal planned, pcxr to assess volume status, will look to update family The patient is critically ill with multiple organ systems failure and requires high complexity decision making for assessment and support, frequent evaluation and titration of therapies, application of advanced monitoring technologies and extensive interpretation of multiple databases.   Critical Care Time devoted to patient care services described in this note is 30 Minutes. This time reflects time of care of this signee: Rory Percy, MD FACP. This critical care time does not reflect procedure time, or teaching time or supervisory time of PA/NP/Med  student/Med Resident etc but could involve care discussion time. Rest per NP/medical resident whose note is outlined above and that I agree with   Mcarthur Rossetti. Tyson Alias, MD, FACP Pgr: [phone] [facility] 09/20/2015 11:14 AM

## 2015-09-20 NOTE — Progress Notes (Signed)
OT Cancellation Note  Patient Details Name: [name] MRN: [id number] DOB: 1956/01/07   Cancelled Treatment:    Reason Eval/Treat Not Completed: Patient not medically ready - Pt transferred to ICU, on CRRT and pressors, she is not appropriate for therapies at this time.  Please reorder when medically stable.   Angelene GiovanniConarpe, Eldon Zietlow M  Dia Jefferys Edinboroonarpe, OTR/L [phone]  09/20/2015, 11:17 AM

## 2015-09-20 NOTE — Care Management Important Message (Signed)
Important Message  Patient Details  Name: [name] MRN: [id number] Date of Birth: Nov 21, 1955   Medicare Important Message Given:  Yes    Bernadette HoitShoffner, Charise Leinbach Coleman 09/20/2015, 10:01 AM

## 2015-09-21 ENCOUNTER — Inpatient Hospital Stay (HOSPITAL_COMMUNITY): Payer: Medicare Other

## 2015-09-21 LAB — COMPREHENSIVE METABOLIC PANEL
ALT: 31 U/L (ref 14–54)
ANION GAP: 8 (ref 5–15)
AST: 33 U/L (ref 15–41)
Albumin: 2.3 g/dL — ABNORMAL LOW (ref 3.5–5.0)
Alkaline Phosphatase: 72 U/L (ref 38–126)
BUN: 13 mg/dL (ref 6–20)
CALCIUM: 8.8 mg/dL — AB (ref 8.9–10.3)
CHLORIDE: 102 mmol/L (ref 101–111)
CO2: 24 mmol/L (ref 22–32)
CREATININE: 3.4 mg/dL — AB (ref 0.44–1.00)
GFR, EST AFRICAN AMERICAN: 16 mL/min — AB (ref >60)
GFR, EST NON AFRICAN AMERICAN: 14 mL/min — AB (ref >60)
Glucose, Bld: 83 mg/dL (ref 65–99)
Potassium: 4.4 mmol/L (ref 3.5–5.1)
SODIUM: 134 mmol/L — AB (ref 135–145)
Total Bilirubin: 0.8 mg/dL (ref 0.3–1.2)
Total Protein: 6.4 g/dL — ABNORMAL LOW (ref 6.5–8.1)

## 2015-09-21 LAB — CBC
HCT: 30.2 % — ABNORMAL LOW (ref 36.0–46.0)
HEMOGLOBIN: 9.2 g/dL — AB (ref 12.0–15.0)
MCH: 26.9 pg (ref 26.0–34.0)
MCHC: 30.5 g/dL (ref 30.0–36.0)
MCV: 88.3 fL (ref 78.0–100.0)
PLATELETS: 120 10*3/uL — AB (ref 150–400)
RBC: 3.42 MIL/uL — AB (ref 3.87–5.11)
RDW: 19.2 % — ABNORMAL HIGH (ref 11.5–15.5)
WBC: 9.6 10*3/uL (ref 4.0–10.5)

## 2015-09-21 LAB — MAGNESIUM: Magnesium: 2.1 mg/dL (ref 1.7–2.4)

## 2015-09-21 LAB — GLUCOSE, CAPILLARY
GLUCOSE-CAPILLARY: 62 mg/dL — AB (ref 65–99)
GLUCOSE-CAPILLARY: 100 mg/dL — AB (ref 65–99)
Glucose-Capillary: 68 mg/dL (ref 65–99)

## 2015-09-21 LAB — RENAL FUNCTION PANEL
ALBUMIN: 2.1 g/dL — AB (ref 3.5–5.0)
Anion gap: 4 — ABNORMAL LOW (ref 5–15)
BUN: 10 mg/dL (ref 6–20)
CALCIUM: 8.8 mg/dL — AB (ref 8.9–10.3)
CHLORIDE: 103 mmol/L (ref 101–111)
CO2: 28 mmol/L (ref 22–32)
CREATININE: 2.87 mg/dL — AB (ref 0.44–1.00)
GFR, EST AFRICAN AMERICAN: 20 mL/min — AB (ref >60)
GFR, EST NON AFRICAN AMERICAN: 17 mL/min — AB (ref >60)
Glucose, Bld: 98 mg/dL (ref 65–99)
PHOSPHORUS: 3.5 mg/dL (ref 2.5–4.6)
Potassium: 4.4 mmol/L (ref 3.5–5.1)
Sodium: 135 mmol/L (ref 135–145)

## 2015-09-21 LAB — APTT: APTT: 42 s — AB (ref 24–37)

## 2015-09-21 LAB — PHOSPHORUS: PHOSPHORUS: 4 mg/dL (ref 2.5–4.6)

## 2015-09-21 MED ORDER — ACETAMINOPHEN 325 MG PO TABS
650.0000 mg | ORAL_TABLET | Freq: Four times a day (QID) | ORAL | Status: DC | PRN
  Administered 2015-09-21: 650 mg via ORAL
  Filled 2015-09-21: qty 2

## 2015-09-21 MED ORDER — DARBEPOETIN ALFA 200 MCG/0.4ML IJ SOSY
200.0000 ug | PREFILLED_SYRINGE | INTRAMUSCULAR | Status: DC
  Administered 2015-09-21: 200 ug via SUBCUTANEOUS
  Filled 2015-09-21 (×2): qty 0.4

## 2015-09-21 MED ORDER — FENTANYL CITRATE (PF) 100 MCG/2ML IJ SOLN
12.5000 ug | INTRAMUSCULAR | Status: DC | PRN
  Administered 2015-09-21 – 2015-09-22 (×5): 12.5 ug via INTRAVENOUS
  Filled 2015-09-21 (×4): qty 2

## 2015-09-21 MED ORDER — CETYLPYRIDINIUM CHLORIDE 0.05 % MT LIQD
7.0000 mL | Freq: Two times a day (BID) | OROMUCOSAL | Status: DC
  Administered 2015-09-21 – 2015-09-22 (×2): 7 mL via OROMUCOSAL

## 2015-09-21 MED ORDER — MIDODRINE HCL 5 MG PO TABS
10.0000 mg | ORAL_TABLET | Freq: Three times a day (TID) | ORAL | Status: DC
  Administered 2015-09-21 – 2015-09-22 (×4): 10 mg via ORAL
  Filled 2015-09-21 (×4): qty 2

## 2015-09-21 MED ORDER — FENTANYL CITRATE (PF) 100 MCG/2ML IJ SOLN
INTRAMUSCULAR | Status: AC
  Filled 2015-09-21: qty 2

## 2015-09-21 NOTE — Progress Notes (Signed)
Daily Progress Note   Patient Name: [name]       Date: 09/21/2015 DOB: [birth year]  Age: 60 y.o. MRN#: [id number] Attending Physician: Rush Farmer, MD Primary Care Physician: Hoyt Koch, MD Admit Date: 09/11/2015  Reason for Consultation/Follow-up: Establishing goals of care   [name] is a 77F with prior history of stroke with resultant aphasia, chronic hypoxemic respiratory failure on 2L O2 continuously, asthma on pulmicort and duonebs, RV dilation on TTE and known chronic diastolic dysfunction, ESRD on TTS HD, HTN, hypothyroidism and recently diagnosed pAfib/flutter on ASA. She presented with bilateral hip/leg pain that seems related to marked soft tissue swelling and induration in this region. Cellulitis considered. Her shortness of breath is reportedly at baseline and she is on her home O2 requirement. She is now in cardiogenic shock now on CRRT. Levophed discontinued this morning.   Subjective: I met today with the patient's daughter, son, and brother.  [name] remained sleepy throughout encounter did not really participate in conversation. See below for further details.  Length of Stay: 3  Current Medications: Scheduled Meds:  . aspirin  325 mg Oral Daily  . budesonide (PULMICORT) nebulizer solution  0.25 mg Nebulization BID  . calcium acetate  667 mg Oral TID WC  . darbepoetin (ARANESP) injection - NON-DIALYSIS  200 mcg Subcutaneous Q Tue-1800  . guaiFENesin  1,200 mg Oral BID  . ipratropium  0.5 mg Nebulization TID  . lactulose  10 g Oral Daily  . levalbuterol  0.63 mg Nebulization TID  . levETIRAcetam  500 mg Oral BID  . levothyroxine  25 mcg Oral QAC breakfast  . midodrine  10 mg Oral TID WC  . multivitamin  1 tablet Oral QHS  . pantoprazole  40 mg  Oral Daily  . sodium chloride flush  10-40 mL Intracatheter Q12H  . sodium chloride flush  3 mL Intravenous Q12H    Continuous Infusions: . sodium chloride 10 mL/hr at 09/20/15 2200  . DOBUTamine Stopped (09/19/15 1930)  . norepinephrine (LEVOPHED) Adult infusion Stopped (09/20/15 1100)  . dialysis replacement fluid (prismasate) 400 mL/hr at 09/21/15 0109  . dialysis replacement fluid (prismasate) 200 mL/hr at 09/21/15 0110  . dialysate (PRISMASATE) 1,750 mL/hr at 09/21/15 1215    PRN Meds: sodium chloride, acetaminophen, ALPRAZolam, alteplase, anticoagulant sodium citrate,  anticoagulant sodium citrate, fentaNYL (SUBLIMAZE) injection, levalbuterol, loperamide, ondansetron **OR** ondansetron (ZOFRAN) IV, sodium chloride, sodium chloride flush, sodium chloride flush, zolpidem  Physical Exam         Weak, chronically ill appearing, Sleepy throughout most of encounter Edema Catheter in place, on CRRT  Diminished breath sounds S1 S2  Vital Signs: BP 88/62 mmHg  Pulse 91  Temp(Src) 98.6 F (37 C) (Oral)  Resp 25  Ht '5\' 7"'$  (1.702 m)  Wt 76.9 kg (169 lb 8.5 oz)  BMI 26.55 kg/m2  SpO2 100% SpO2: SpO2: 100 % O2 Device: O2 Device: Nasal Cannula O2 Flow Rate: O2 Flow Rate (L/min): 4 L/min  Intake/output summary:   Intake/Output Summary (Last 24 hours) at 09/21/15 1510 Last data filed at 09/21/15 1400  Gross per 24 hour  Intake    810 ml  Output   1916 ml  Net  -1106 ml   LBM: Last BM Date: 09/20/15 Baseline Weight: Weight: 77 kg (169 lb 12.1 oz) Most recent weight: Weight: 76.9 kg (169 lb 8.5 oz)       Palliative Assessment/Data:    Flowsheet Rows        Most Recent Value   Intake Tab    Referral Department  Critical care   Unit at Time of Referral  ICU   Palliative Care Primary Diagnosis  Cardiac   Date Notified  09/19/15   Palliative Care Type  Return patient Palliative Care   Reason for referral  Clarify Goals of Care   Date of Admission  10/01/2015   Date first  seen by Palliative Care  09/19/15   # of days Palliative referral response time  0 Day(s)   # of days IP prior to Palliative referral  1   Clinical Assessment    Palliative Performance Scale Score  30%   Pain Max last 24 hours  5   Pain Min Last 24 hours  4   Dyspnea Max Last 24 Hours  6   Dyspnea Min Last 24 hours  5   Psychosocial & Spiritual Assessment    Palliative Care Outcomes    Patient/Family meeting held?  Yes   Who was at the meeting?  patient    Palliative Care Outcomes  Clarified goals of care      Patient Active Problem List   Diagnosis Date Noted  . Pressure ulcer 09/19/2015  . Hypoxemia   . Asthma   . Acute pulmonary edema (HCC)   . PAH (pulmonary artery hypertension) ([place])   . Acute cor pulmonale (HCC)   . Acute on chronic diastolic heart failure ([place])   . Anasarca associated with disorder of kidney   . Encounter for palliative care   . Goals of care, counseling/discussion   . Cardiogenic shock ([place])   . Volume overload 09/20/2015  . Elevated troponin 09/28/2015  . Hypotension 09/25/2015  . Hypoxia 09/07/2015  . Leukocytosis 09/27/2015  . Anemia 09/28/2015  . ESRD on dialysis ([place]) 07/27/2015  . Left middle cerebral artery stroke ([place]) 11/25/2014  . Right hemiparesis ([place]) 11/25/2014  . Aphasia due to stroke 11/25/2014  . DNAR (do not attempt resuscitation) 11/20/2014  . Paroxysmal a-fib ([place]) 11/16/2014  . HLD (hyperlipidemia) 11/16/2014  . Essential hypertension   . PFO (patent foramen ovale)   . Chronic ischemic colitis ([place]) 09/25/2014  . Left hemiparesis ([place]) 09/11/2014  . History of ETT   . Aortic aneurysm ([place]) 08/13/2014  . Duodenal anastomotic leak   . Protein calorie malnutrition (  [place])   . Right heart failure ([place])   . 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 Fistula   . History of intermediate V/Q scan   . Other emphysema ([place])   . Abnormal TSH   . Enteritis due to Clostridium difficile 07/19/2014  . Perforated  gastric ulcer ([place]) 07/01/2014  . Stroke ([place])   . Hyperlipidemia   . Cerebral thrombosis with cerebral infarction ([place]) 06/20/2014  . Hypertension 06/20/2014  . ESRD needing dialysis ([place]) 06/20/2014  . Obesity (BMI 30-39.9) 06/20/2014  . Depression 06/20/2014  . Left renal mass 06/20/2014  . Chronic diastolic heart failure ([place]) 06/20/2014  . Tobacco use disorder 06/20/2014    Palliative Care Assessment & Plan   Patient Profile:  Assessment: Cardiogenic shock Has R heart failure ESRD History of stroke   Recommendations/Plan: - I met today with patient's daughter, son, and brother. We reviewed her clinical course to this point in time as well as the fact that she has multiple, chronic medical conditions that continue to worsen.  - Her family is adamant that they believe she will get better and that she "pulled out from much worse" last year. We discussed that her clinical situation is much different at this point including the fact that she has renal failure and has been unable to tolerate dialysis. Because of this, she is now on CRRT without significant demonstrated benefit.  We discussed this is only a temporary treatment option and cannot be continued indefinitely. We also discussed at length the fact that she has her pulmonary hypertension as well as end-stage heart failure that is irreversible. - Her family reports understanding the severity of her situation but feels that things "are in God's hands" and not willing to discuss any changes in her therapy at this time.   Prognosis:   < 2 weeks  Discharge Planning:    Hospital Death appears very likely. Plan for follow-up tomorrow at 1 PM to continue conversation regarding long-term prognosis and goals of care   Care plan was discussed with patient    Thank you for allowing the Palliative Medicine Team to assist in the care of this patient.   Time In: 1320 Time Out: 1400 Total Time 40 Prolonged Time Billed  no         Greater than 50%  of this time was spent counseling and coordinating care related to the above assessment and plan.  Micheline Rough, MD [phone]  Please contact Palliative Medicine Team phone at [phone] for questions and concerns.

## 2015-09-21 NOTE — Progress Notes (Addendum)
PULMONARY / CRITICAL CARE MEDICINE   Name: [name] MRN: [id number] DOB: [birth year]    ADMISSION DATE:  08-Oct-2015 CONSULTATION DATE:  October 08, 2015  REFERRING MD:  Triad  CHIEF COMPLAINT: bilateral leg/hip pain  HISTORY OF PRESENT ILLNESS:   3F hx ESRD on HD, prior CVA with residual aphasia, asthma on pulmicort and duoNebs, hypothyroidism, paroxysmal atrial fibrillation/flutter on ASA, patent foramen ovale, HTN, chronic diastolic heart failure, likely pulmonary HTN with RVSP 86 in 11/2014 (dilated RV and RA at that time) presenting with shortness of breath, wheezing and bilateral hip/leg pain.  PCCM asked to see for cardiogenic shock  SUBJECTIVE:  No overnight events. BP improved. Off pressors. Leg pain and wheezing this AM.   VITAL SIGNS: BP 105/78 mmHg  Pulse 41  Temp(Src) 96.5 F (35.8 C) (Axillary)  Resp 30  Ht  (1.702 m)  Wt 169 lb 8.5 oz (76.9 kg)  BMI 26.55 kg/m2  SpO2 100%   INTAKE / OUTPUT: I/O last 3 completed shifts: In: 1249.8 [P.O.:480; I.V.:469.8; IV Piggyback:300] Out: 2591 [Other:2591]  PHYSICAL EXAMINATION:  General Awake, alert, NAD.   HEENT PERRL, EOMI. Moist mucus membranes.   Pulmonary Scattered rhonchi, Poor effort, symmetrical expansion. Mild scattered wheezes.   Cardiovascular Tachy . Regular rhythm. S1, s2. No m/r/g. PMI displaced rightward with marked RV heave noted. Distal pulses palpable. LUE AVF with palpable thrill  Abdomen Soft, non-tender, non-distended, positive bowel sounds, no palpable organomegaly or masses. Ventral hernia easily reduced. Midline scar and prior PEG site scar. Normoresonant to percussion.  Musculoskeletal Normal tone, moves all extremities. Grossly normal.   Lymphatics No cervical, supraclavicular or axillary adenopathy.   Neurologic Expressive aphasia with some delay in receptive comprehension. Follows some commands. Moves all extremities spontaneously.   Skin/Integuement No rash, no cyanosis, no clubbing. Tender  to palpation over greater trochanter region bilaterally with marked induration and soft tissue swelling extending around to her sacrum. No drainage. Slightly warm to touch. No erythema noted.    LABS:  BMET  Recent Labs Lab 09/20/15 0450 09/20/15 1600 09/21/15 0500  NA 132*  133* 134* 134*  K 4.2  4.2 4.4 4.4  CL 98*  99* 101 102  CO2 BUN 22*  21* 17 13  CREATININE 4.91*  4.77* 4.06* 3.40*  GLUCOSE 102*  101* 132* 83   Electrolytes  Recent Labs Lab 10/08/2015 2206  09/20/15 0450 09/20/15 1600 09/21/15 0500  CALCIUM  --   < > 8.7*  8.7* 8.7* 8.8*  MG 1.6*  --  1.9  --  2.1  PHOS  --   --  5.1* 4.8* 4.0  < > = values in this interval not displayed. CBC  Recent Labs Lab 09/19/15 0049 09/20/15 0450 09/21/15 0500  WBC 11.0* 12.1* 9.6  HGB 10.3* 10.1* 9.2*  HCT 32.6* 32.6* 30.2*  PLT 166 168 120*    Coag's  Recent Labs Lab October 08, 2015 1818 09/19/15 0049 09/20/15 0450 09/21/15 0500  APTT 40* 38* 38* 42*  INR 1.62* 1.56*  --   --    Sepsis Markers  Recent Labs Lab 10/08/2015 1818  09/19/15 0546 09/19/15 1622 09/19/15 2110  LATICACIDVEN  --   < > 1.8 1.7 0.9  PROCALCITON 7.77  --   --   --   --   < > = values in this interval not displayed.  ABG  Recent Labs Lab 08-Oct-2015 1735  PHART 7.341*  PCO2ART 49.2*  PO2ART 49.0*  Liver Enzymes  Recent Labs Lab 09/19/15 0049 09/20/15 0450 09/20/15 1600 09/21/15 0500  AST 65* 43*  --  33  ALT 43 37  --  31  ALKPHOS 78 78  --  72  BILITOT 0.8 1.0  --  0.8  ALBUMIN 2.3* 2.3*  2.2* 2.3* 2.3*   Cardiac Enzymes  Recent Labs Lab 10/10/15 2206 09/19/15 0049 09/19/15 0846  TROPONINI 0.60* 0.63* 0.68*   Glucose  Recent Labs Lab 09/17/15 0144 10/10/2015 1603 09/19/15 0856 09/19/15 2056 09/20/15 0838 09/20/15 0840  GLUCAP 82 57* 80 86 56* 88   Imaging Dg Chest Port 1 View  09/21/2015  CLINICAL DATA:  Cardiogenic shock.  Atrial fibrillation. EXAM: PORTABLE CHEST 1 VIEW  COMPARISON:  Single-view of the chest 09/19/2015 and 10-Oct-2015. FINDINGS: Right IJ approach dialysis catheter is seen as on the most recent study. There is cardiomegaly and bibasilar airspace disease. Likely bilateral pleural effusions are noted. Aeration appears worsened compared to yesterday's study. No pneumothorax. IMPRESSION: Increased small to moderate bilateral pleural effusions and basilar airspace disease since yesterday's examination. Cardiomegaly. Electronically Signed   By: Drusilla Kanner M.D.   On: 09/21/2015 07:13     STUDIES:  CXR as above CT Angio Chest 6/17 >> Suggestive of Right Heart Failure, right pleural effusion, pulmonary nodule. NO PE.   CULTURES: Blood cx 6/17 >>  ANTIBIOTICS: Vancomycin 6/17 >> 6/20 Zosyn 6/17 >> 6/20  SIGNIFICANT EVENTS: 6/17 >> Admit 6/21 >> Off pressors  LINES/TUBES: RIJ HD cath 6/18 >>  DISCUSSION: [name] is a 18F with prior history of stroke with resultant aphasia, chronic hypoxemic respiratory failure on 2L O2 continuously, asthma on pulmicort and duonebs, RV dilation on TTE and known chronic diastolic dysfunction, ESRD on TTS HD, HTN, hypothyroidism and recently diagnosed pAfib/flutter on ASA. She presented with bilateral hip/leg pain that seems related to marked soft tissue swelling and induration in this region. Cellulitis considered. Her shortness of breath is reportedly at baseline and she is on her home O2 requirement. She is now in cardiogenic shock, currently on Levophed. Now DNR/DNI.   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 P:   Continue supplemental O2 Continue home asthma regimen (pulmicort + DuoNebs) Volume removal as tolerated by blood pressure DNR/DNI (meds and NIPPV okay)  CARDIOVASCULAR A:  Tachycardia Paroxysmal a fib/flutter on ASA RV dilation with severe TR and elevated RVSP on prior echo  and CT findings consistent with RH failure (pHTN likely WHO group II) Cardiogenic shock; off pressors P:  Supportive care Volume removal w/ CRRT per renal Additional recs per cardiology  RENAL A:   ESRD on TTS HD P:   CRRT per renal  GASTROINTESTINAL A:   No acute issues P:   PPI  HEMATOLOGIC A:   Chronic anemia related to ESRD P:  Per renal  INFECTIOUS A:   Leukocytosis; resolved P:   Follow cultures Discontinue ABx  ENDOCRINE A:   Hypothyroidism P:   Continue Synthroid  NEUROLOGIC A:   Prior CVA with residual aphasia on ASA P:   Hold sedating meds    Lauris Chroman, MD PGY-3, Internal Medicine Pager: [phone]  STAFF NOTE: Cindi Carbon, MD FACP have personally reviewed patient's available data, including medical history, events of note, physical examination and test results as part of my evaluation. I have discussed with resident/NP and other care providers such as pharmacist, RN and RRT. In addition,  I personally evaluated patient and elicited key findings of: no distress, off levophed, no change sin neurostatus, remains on cvvhd, would NOT restart pressors will not change outcome, she does not appear to be a pt who can tolerate cvvhd or hd anymore, is she even a candidate further?, need to transition to comfort care, heroics, are futile, there is NO therapy for cardiac therapy, goal is to wean cvvhd off, pall care d/w, no infection present, dc all abx  Mcarthur Rossettianiel J. Tyson AliasFeinstein, MD, FACP Pgr: [phone] [facility] 09/21/2015 10:53 AM

## 2015-09-21 NOTE — Progress Notes (Signed)
[facility] Rounding Note Subjective/Interval Events  On CRRT (6/18) All 4K/400/200/1750 Changed from 100->0-50 neg yest d/t BP intolerance Currently off pressors CXR looks worse  Weight trending (EDW 75.5 outpt) 6/18 78.1 kg 6/19 78 6/20 76.9  Complaining of upper legs hurting Seen yesterday by Palliative Care   Objective Vital signs in last 24 hours: Filed Vitals:   09/21/15 0700 09/21/15 0711 09/21/15 0713 09/21/15 0800  BP: 123/75   105/78  Pulse:  98  41  Temp:      TempSrc:      Resp: Height:      Weight:      SpO2:  97% 74% 100%   Weight change: -1.2 kg (-2 lb 10.3 oz)  Intake/Output Summary (Last 24 hours) at 09/21/15 0828 Last data filed at 09/21/15 0800  Gross per 24 hour  Intake    660 ml  Output   1697 ml  Net  -1037 ml   Physical Exam BP 105/78 mmHg  Pulse 41  Temp(Src) 96.5 F (35.8 C) (Axillary)  Resp 30  Ht  (1.702 m)  Wt 76.9 kg (169 lb 8.5 oz)  BMI 26.55 kg/m2  SpO2 100%  Very uncomfortable from upper leg/hip pain More alert today R IJ temp cath in place (6/18) with CRRT running + JVD Tachy, regular S1S2 cannot hear murmur Abd non-distended and not tender + ventral hernia 2+ edema both LE's esp around hip area LUA AVF +bruit    Labs:  Recent Labs Lab 09/16/15 1903 09-28-15 1454 09/19/15 0049 09/20/15 0450 09/20/15 1600 09/21/15 0500  NA 133* 133* 132* 132*  133* 134* 134*  K 4.4 4.8 4.7 4.2  4.2 4.4 4.4  CL 95* 93* 96* 98*  99* 101 102  CO2 GLUCOSE 55* 76 114* 102*  101* 132* 83  BUN 20 17 21* 22*  21* 17 13  CREATININE 5.29* 4.59* 5.22* 4.91*  4.77* 4.06* 3.40*  CALCIUM 9.2 9.4 9.1 8.7*  8.7* 8.7* 8.8*  PHOS  --   --   --  5.1* 4.8* 4.0     Recent Labs Lab 09/19/15 0049 09/20/15 0450 09/20/15 1600 09/21/15 0500  AST 65* 43*  --  33  ALT 43 37  --  31  ALKPHOS 78 78  --  72  BILITOT 0.8 1.0  --  0.8  PROT 6.5 6.6  --  6.4*  ALBUMIN 2.3* 2.3*  2.2* 2.3* 2.3*     Recent Labs Lab 09/19/15 1318  AMMONIA 65*     Recent Labs Lab 09/16/15 1903 28-Sep-2015 1620 09/19/15 0049 09/20/15 0450 09/21/15 0500  WBC 10.5 14.9* 11.0* 12.1* 9.6  NEUTROABS 7.9* 12.4*  --   --   --   HGB 11.9* 10.2* 10.3* 10.1* 9.2*  HCT 36.8 32.7* 32.6* 32.6* 30.2*  MCV 84.8 84.3 84.7 84.5 88.3  PLT 214 194 166 168 120*     Recent Labs Lab 2015/09/28 1454 2015-09-28 1620 09-28-15 2206 09/19/15 0049 09/19/15 0846  CKTOTAL  --  74  --   --   --   TROPONINI 0.83*  --  0.60* 0.63* 0.68*     Recent Labs Lab 2015-09-28 1603 09/19/15 0856 09/19/15 2056 09/20/15 0838 09/20/15 0840  GLUCAP 57* 80 86 56* 88    Studies/Results: Dg Chest Port 1 View  09/21/2015  CLINICAL DATA:  Cardiogenic shock.  Atrial fibrillation. EXAM: PORTABLE CHEST 1 VIEW COMPARISON:  Single-view of the chest 09/19/2015 and 09/10/2015. FINDINGS: Right IJ approach dialysis catheter is seen as on the most recent study. There is cardiomegaly and bibasilar airspace disease. Likely bilateral pleural effusions are noted. Aeration appears worsened compared to yesterday's study. No pneumothorax. IMPRESSION: Increased small to moderate bilateral pleural effusions and basilar airspace disease since yesterday's examination. Cardiomegaly. Electronically Signed   By: Drusilla Kannerhomas  Dalessio M.D.   On: 09/21/2015 07:13   Dg Chest Port 1 View  09/19/2015  CLINICAL DATA:  Hemodialysis catheter placement. EXAM: PORTABLE CHEST 1 VIEW COMPARISON:  Chest radiograph Alizabeth 17, 2017 FINDINGS: Cardiac silhouette is mildly enlarged unchanged. Tortuous mildly calcified aortic. Fullness of pulmonary hila better characterized on yesterday's CT chest. No pleural effusion or focal consolidation. Interval placement of RIGHT internal jugular central venous catheter with distal tip projecting in proximal superior vena cava. No pneumothorax. Soft tissue planes included osseous structure in unchanged. IMPRESSION: New RIGHT internal jugular  central venous catheter projects in proximal superior vena cava. No pneumothorax. Stable cardiomegaly. Electronically Signed   By: Awilda Metroourtnay  Bloomer M.D.   On: 09/19/2015 19:06   Medications: Infusions . sodium chloride 10 mL/hr at 09/20/15 2200  . DOBUTamine Stopped (09/19/15 1930)  . norepinephrine (LEVOPHED) Adult infusion Stopped (09/20/15 1100)  . dialysis replacement fluid (prismasate) 400 mL/hr at 09/21/15 0109  . dialysis replacement fluid (prismasate) 200 mL/hr at 09/21/15 0110  . dialysate (PRISMASATE) 1,750 mL/hr at 09/21/15 0617   . aspirin  325 mg Oral Daily  . budesonide (PULMICORT) nebulizer solution  0.25 mg Nebulization BID  . calcium acetate  667 mg Oral TID WC  . guaiFENesin  1,200 mg Oral BID  . ipratropium  0.5 mg Nebulization TID  . lactulose  10 g Oral Daily  . levalbuterol  0.63 mg Nebulization TID  . levETIRAcetam  500 mg Oral BID  . levothyroxine  25 mcg Oral QAC breakfast  . multivitamin  1 tablet Oral QHS  . pantoprazole  40 mg Oral Daily  . piperacillin-tazobactam  3.375 g Intravenous Q6H  . sodium chloride flush  10-40 mL Intracatheter Q12H  . sodium chloride flush  3 mL Intravenous Q12H   Outpt Dialysis: SW TTS 4h 75.5kg 3K/ 2.25 bath Hep none LUA AVF Mircera 225 last 6/13 (11.1, 15%, 997) Hect 6ug, phoslo 2ac (9.0/ 6.7, 330 pth) Dry wt recently dec'd from 76.5kg Missing about 1 session / week, stays on full rx when she comes  Background 60 y.o. 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 6/17 was started on midodrine for low BP's (new issue for her). Presented with painful upper legs/edema.  Patient sedentary at home, can do about 1/2 of ADL's, uses walker/has expressive aphasia d/t prior CVA. Daughter takes her to HD. In ED 6/18 CXR negative, CT angio chest showed R HF w contrast reflux into IVC, no edema or PE. BNP  high 4500. BP's low 80's requiring addition pressors. Asked to see for ESRD - too unstable for HD. R IJ temp cath placed and pt on CRRT (6/18).   Assessment: 1. Bilat LE swelling/ painful edema of upper legs - no cellulitis.Still on ATB's  2. Chronic dCHF/pulm HTN/, decomp RHF.CT angio no PE. CXR today with worsening bilat air space disease/small effusions. Will try to be more aggressive with volume removal (had been intolerant and backed off yesterday)  3. ESRD (usual HD TTS AF via L AVF). CRRT now d/t hypotension. Off levophed. CXR looks worse. BP more stable. Try for 50-100/hour off. Add midodrine. K and phos still both look fine. 4. Hypotension - just started on midodrine prior to current admission - low BP new issue. Off levophed. Start midodrine. 5. Hx CVA's w/ residual aphasia 6. PAF 7. Anemia Hb down to 9.1. On max Mircera as outpt. Start Aranesp 200 weekly. Dose today. 8. Sec HPT - hectorol/binders (not awake enough for po right now) 9. 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 Disposition - prognosis here (in my mind) would seem rather poor - Palliative has seen. Partial code.    Camille Bal, MD [facility] [phone] pager 09/21/2015, 8:28 AM

## 2015-09-22 LAB — COMPREHENSIVE METABOLIC PANEL
ALBUMIN: 2.2 g/dL — AB (ref 3.5–5.0)
ALT: 31 U/L (ref 14–54)
AST: 31 U/L (ref 15–41)
Alkaline Phosphatase: 66 U/L (ref 38–126)
Anion gap: 8 (ref 5–15)
BUN: 8 mg/dL (ref 6–20)
CHLORIDE: 103 mmol/L (ref 101–111)
CO2: 27 mmol/L (ref 22–32)
CREATININE: 2.43 mg/dL — AB (ref 0.44–1.00)
Calcium: 9.1 mg/dL (ref 8.9–10.3)
GFR calc Af Amer: 24 mL/min — ABNORMAL LOW (ref >60)
GFR, EST NON AFRICAN AMERICAN: 21 mL/min — AB (ref >60)
GLUCOSE: 74 mg/dL (ref 65–99)
Potassium: 4.5 mmol/L (ref 3.5–5.1)
Sodium: 138 mmol/L (ref 135–145)
Total Bilirubin: 0.9 mg/dL (ref 0.3–1.2)
Total Protein: 6.6 g/dL (ref 6.5–8.1)

## 2015-09-22 LAB — GLUCOSE, CAPILLARY: Glucose-Capillary: 87 mg/dL (ref 65–99)

## 2015-09-22 LAB — PHOSPHORUS: Phosphorus: 3 mg/dL (ref 2.5–4.6)

## 2015-09-22 LAB — CBC
HCT: 29.6 % — ABNORMAL LOW (ref 36.0–46.0)
Hemoglobin: 9 g/dL — ABNORMAL LOW (ref 12.0–15.0)
MCH: 26.5 pg (ref 26.0–34.0)
MCHC: 30.4 g/dL (ref 30.0–36.0)
MCV: 87.3 fL (ref 78.0–100.0)
PLATELETS: 86 10*3/uL — AB (ref 150–400)
RBC: 3.39 MIL/uL — ABNORMAL LOW (ref 3.87–5.11)
RDW: 19.6 % — AB (ref 11.5–15.5)
WBC: 8.6 10*3/uL (ref 4.0–10.5)

## 2015-09-22 LAB — MAGNESIUM: MAGNESIUM: 2.2 mg/dL (ref 1.7–2.4)

## 2015-09-22 MED ORDER — MORPHINE SULFATE (PF) 2 MG/ML IV SOLN
2.0000 mg | INTRAVENOUS | Status: DC | PRN
  Administered 2015-09-22 (×2): 2 mg via INTRAVENOUS
  Filled 2015-09-22 (×2): qty 1

## 2015-09-22 MED ORDER — MORPHINE SULFATE (PF) 2 MG/ML IV SOLN
2.0000 mg | INTRAVENOUS | Status: DC | PRN
  Administered 2015-09-22 – 2015-09-23 (×2): 2 mg via INTRAVENOUS
  Filled 2015-09-22 (×2): qty 1

## 2015-09-22 NOTE — Progress Notes (Signed)
[facility] Rounding Note  Subjective/Interval Events  On CRRT (6/18) All 4K/400/200/1750 Levophed off (with no plans to restart)/on midodrine Generally poor tolerance of CRRT - BP 80's/only 1 liter off/24 hours/still markedly overloaded at least peripherally  Seen yesterday by Palliative Care and family "wants to continue everything"   Objective Vital signs in last 24 hours: Filed Vitals:   09/22/15 0706 09/22/15 0708 09/22/15 0800 09/22/15 0830  BP:   83/56 92/67  Pulse: 95   89  Temp:   97.7 F (36.5 C)   TempSrc:   Oral   Resp: 22  21 16   Height:      Weight:      SpO2: 100% 98% 94% 98%   Weight change: -1.2 kg (-2 lb 10.3 oz)  Intake/Output Summary (Last 24 hours) at 09/22/15 0912 Last data filed at 09/22/15 0900  Gross per 24 hour  Intake    510 ml  Output   1823 ml  Net  -1313 ml   Weight trending (EDW 75.5 outpt) 6/18 78.1 kg 6/19 78 6/20 76.9 6/21 75.7  Physical Exam BP 92/67 mmHg  Pulse 89  Temp(Src) 97.7 F (36.5 C) (Oral)  Resp 16  Ht 5\' 7"  (1.702 m)  Wt 75.7 kg (166 lb 14.2 oz)  BMI 26.13 kg/m2  SpO2 98%  Very uncomfortable from upper leg/hip pain Jibberish but follows commands R IJ temp cath in place (6/18) with CRRT running + JVD Tachy, regular S1S2 cannot hear murmur Abd non-distended and not tender + ventral hernia 2+-3+ edema both LE's esp around hip area LUA AVF +bruit    Labs:  Recent Labs Lab 09/02/2015 1454 09/19/15 0049 09/20/15 0450 09/20/15 1600 09/21/15 0500 09/21/15 1600 09/22/15 0400  NA 133* 132* 132*  133* 134* 134* 135 138  K 4.8 4.7 4.2  4.2 4.4 4.4 4.4 4.5  CL 93* 96* 98*  99* 101 102 103 103  CO2 26 24 25  25 26 24 28 27   GLUCOSE 76 114* 102*  101* 132* 83 98 74  BUN 17 21* 22*  21* 17 13 10 8   CREATININE 4.59* 5.22* 4.91*  4.77* 4.06* 3.40* 2.87* 2.43*  CALCIUM 9.4 9.1 8.7*  8.7* 8.7* 8.8* 8.8* 9.1  PHOS  --   --  5.1* 4.8* 4.0 3.5 3.0     Recent Labs Lab 09/20/15 0450  09/21/15 0500  09/21/15 1600 09/22/15 0400  AST 43*  --  33  --  31  ALT 37  --  31  --  31  ALKPHOS 78  --  72  --  66  BILITOT 1.0  --  0.8  --  0.9  PROT 6.6  --  6.4*  --  6.6  ALBUMIN 2.3*  2.2*  < > 2.3* 2.1* 2.2*  < > = values in this interval not displayed.  Recent Labs Lab 09/19/15 1318  AMMONIA 65*     Recent Labs Lab 09/16/15 1903 09/15/2015 1620 09/19/15 0049 09/20/15 0450 09/21/15 0500 09/22/15 0400  WBC 10.5 14.9* 11.0* 12.1* 9.6 8.6  NEUTROABS 7.9* 12.4*  --   --   --   --   HGB 11.9* 10.2* 10.3* 10.1* 9.2* 9.0*  HCT 36.8 32.7* 32.6* 32.6* 30.2* 29.6*  MCV 84.8 84.3 84.7 84.5 88.3 87.3  PLT 214 194 166 168 120* 86*     Recent Labs Lab 09/05/2015 1454 09/28/2015 1620 10/01/2015 2206 09/19/15 0049 09/19/15 0846  CKTOTAL  --  74  --   --   --  TROPONINI 0.83*  --  0.60* 0.63* 0.68*     Recent Labs Lab 09/20/15 0840 09/21/15 0759 09/21/15 0914 09/21/15 0916 09/22/15 0736  GLUCAP 88 62* 68 100* 87    Studies/Results: Dg Chest Port 1 View  09/21/2015  CLINICAL DATA:  Cardiogenic shock.  Atrial fibrillation. EXAM: PORTABLE CHEST 1 VIEW COMPARISON:  Single-view of the chest 09/19/2015 and 18-Aug-2015. FINDINGS: Right IJ approach dialysis catheter is seen as on the most recent study. There is cardiomegaly and bibasilar airspace disease. Likely bilateral pleural effusions are noted. Aeration appears worsened compared to yesterday's study. No pneumothorax. IMPRESSION: Increased small to moderate bilateral pleural effusions and basilar airspace disease since yesterday's examination. Cardiomegaly. Electronically Signed   By: Drusilla Kannerhomas  Dalessio M.D.   On: 09/21/2015 07:13   Medications: Infusions . sodium chloride 10 mL/hr at 09/22/15 0700  . dialysis replacement fluid (prismasate) 400 mL/hr at 09/22/15 0504  . dialysis replacement fluid (prismasate) 200 mL/hr at 09/21/15 2207  . dialysate (PRISMASATE) 1,750 mL/hr at 09/22/15 0656   . antiseptic oral rinse  7 mL Mouth  Rinse BID  . aspirin  325 mg Oral Daily  . budesonide (PULMICORT) nebulizer solution  0.25 mg Nebulization BID  . calcium acetate  667 mg Oral TID WC  . darbepoetin (ARANESP) injection - NON-DIALYSIS  200 mcg Subcutaneous Q Tue-1800  . guaiFENesin  1,200 mg Oral BID  . ipratropium  0.5 mg Nebulization TID  . lactulose  10 g Oral Daily  . levalbuterol  0.63 mg Nebulization TID  . levETIRAcetam  500 mg Oral BID  . levothyroxine  25 mcg Oral QAC breakfast  . midodrine  10 mg Oral TID WC  . multivitamin  1 tablet Oral QHS  . pantoprazole  40 mg Oral Daily  . sodium chloride flush  10-40 mL Intracatheter Q12H  . sodium chloride flush  3 mL Intravenous Q12H   Outpt Dialysis: SW TTS 4h 75.5kg 3K/ 2.25 bath Hep none LUA AVF Mircera 225 last 6/13 (11.1, 15%, 997) Hect 6ug, phoslo 2ac (9.0/ 6.7, 330 pth) Dry wt recently dec'd from 76.5kg Missing about 1 session / week, stays on full rx when she comes  Background 60 y.o. 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 6/17 was started on midodrine for low BP's (new issue for her). Presented with painful upper legs/edema.  Patient sedentary at home, can do about 1/2 of ADL's, uses walker/has expressive aphasia d/t prior CVA. Daughter takes her to HD. In ED 6/18 CXR negative, CT angio chest showed R HF w contrast reflux into IVC, no edema or PE. BNP high 4500. BP's low 80's requiring addition pressors. Asked to see for ESRD - too unstable for HD. R IJ temp cath placed and pt on CRRT (6/18).   Assessment/Plans:  1. Chronic dCHF/pulm HTN/, decomp RHF.CT angio no PE. CXR 6/20 with worsening bilat air space disease/small effusions. Intolerant in general of more aggressive volume removal. Weight down 1 kg past 24 hours, BP low. CRRT only option right now and not going well in terms of vol off.  2. ESRD (usual HD TTS AF via L  AVF). CRRT now d/t hypotension. Off levophed/on midodrine. CXR 6/19 worse. Low BP as noted. K and phos still fine. Not even tolerating CRRT now. 3. Hypotension - midodrine. No plans to restart levophed. 4. Hx  CVA's w/ residual aphasia 5. PAF 6. Anemia  Aranesp 200 weekly. Dosed . 7. Sec HPT - hectorol/binders (not awake enough for po right now) 8. 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 Disposition - prognosis here is progressively more dismal    Camille Bal, MD [facility] [phone] pager 09/22/2015, 9:12 AM

## 2015-09-22 NOTE — Progress Notes (Addendum)
Patient ID: Nan C Mele, female   DOB: 1955-04-17, 60 y.o.   MRN: [id number] I have had extensive discussions with family kids and sister in law. We discussed patients current circumstances and organ failures. We also discussed patient's prior wishes under circumstances such as this. Family has decided to NOT perform resuscitation and for full comfort care.. Int moprhine fo rnowe, when more family arrive likley will need drip, per pall care Mcarthur Rossettianiel J. Tyson AliasFeinstein, MD, FACP Pgr: [phone] [facility]]

## 2015-09-22 NOTE — Progress Notes (Signed)
Patient arrived to 6N. Report received from [place], [place]. Family at bedside. Patient alert.

## 2015-09-22 NOTE — Progress Notes (Signed)
CRRT TMP and filter pressures drastically increased; filter getting ready to clot.  Orders received from Dr. Tyson AliasFeinstein and Dr. Neale BurlyFreeman to stop CRRT. [place], Mitzi HansenJessica Marie

## 2015-09-22 NOTE — Progress Notes (Signed)
Daily Progress Note   Patient Name: [name]       Date: 09/22/2015 DOB: [birth year]  Age: 60 y.o. MRN#: [id number] Attending Physician: Rush Farmer, MD Primary Care Physician: Hoyt Koch, MD Admit Date: 09/23/2015  Reason for Consultation/Follow-up: Establishing goals of care   Ms. Alicia is a 35F with prior history of stroke with resultant aphasia, chronic hypoxemic respiratory failure on 2L O2 continuously, asthma on pulmicort and duonebs, RV dilation on TTE and known chronic diastolic dysfunction, ESRD on TTS HD, HTN, hypothyroidism and recently diagnosed pAfib/flutter on ASA. She presented with bilateral hip/leg pain that seems related to marked soft tissue swelling and induration in this region. Cellulitis considered. Her shortness of breath is reportedly at baseline and she is on her home O2 requirement. She is now in cardiogenic shock now on CRRT. Levophed discontinued.  Subjective: I met today with the patient's daughter and son in conjunction with Dr. Titus Mould.  Ms. Alper did not really arouse or participate in conversation. See below for further details.  Length of Stay: 4  Current Medications: Scheduled Meds:  . antiseptic oral rinse  7 mL Mouth Rinse BID  . aspirin  325 mg Oral Daily  . budesonide (PULMICORT) nebulizer solution  0.25 mg Nebulization BID  . calcium acetate  667 mg Oral TID WC  . darbepoetin (ARANESP) injection - NON-DIALYSIS  200 mcg Subcutaneous Q Tue-1800  . guaiFENesin  1,200 mg Oral BID  . ipratropium  0.5 mg Nebulization TID  . lactulose  10 g Oral Daily  . levalbuterol  0.63 mg Nebulization TID  . levETIRAcetam  500 mg Oral BID  . levothyroxine  25 mcg Oral QAC breakfast  . midodrine  10 mg Oral TID WC  . multivitamin  1 tablet  Oral QHS  . pantoprazole  40 mg Oral Daily  . sodium chloride flush  10-40 mL Intracatheter Q12H  . sodium chloride flush  3 mL Intravenous Q12H    Continuous Infusions: . sodium chloride Stopped (09/22/15 0938)  . dialysis replacement fluid (prismasate) 400 mL/hr at 09/22/15 0504  . dialysis replacement fluid (prismasate) 200 mL/hr at 09/21/15 2207  . dialysate (PRISMASATE) 1,750 mL/hr at 09/22/15 1248    PRN Meds: sodium chloride, acetaminophen, ALPRAZolam, alteplase, anticoagulant sodium citrate, anticoagulant sodium citrate, fentaNYL (SUBLIMAZE) injection, levalbuterol,  loperamide, ondansetron **OR** ondansetron (ZOFRAN) IV, sodium chloride, sodium chloride flush, sodium chloride flush, zolpidem  Physical Exam         Weak, chronically ill appearing, does not awaken throughout encounter Edema Catheter in place, on CRRT  Diminished breath sounds S1 S2  Vital Signs: BP 97/63 mmHg  Pulse 93  Temp(Src) 97.8 F (36.6 C) (Oral)  Resp 22  Ht '5\' 7"'$  (1.702 m)  Wt 75.7 kg (166 lb 14.2 oz)  BMI 26.13 kg/m2  SpO2 100% SpO2: SpO2: 100 % O2 Device: O2 Device: Nasal Cannula O2 Flow Rate: O2 Flow Rate (L/min): 2 L/min  Intake/output summary:   Intake/Output Summary (Last 24 hours) at 09/22/15 1331 Last data filed at 09/22/15 1300  Gross per 24 hour  Intake 456.33 ml  Output   1647 ml  Net -1190.67 ml   LBM: Last BM Date: 09/22/15 Baseline Weight: Weight: 77 kg (169 lb 12.1 oz) Most recent weight: Weight: 75.7 kg (166 lb 14.2 oz)       Palliative Assessment/Data:  Flowsheet Rows        Most Recent Value   Intake Tab    Referral Department  Critical care   Unit at Time of Referral  ICU   Palliative Care Primary Diagnosis  Cardiac   Date Notified  09/19/15   Palliative Care Type  Return patient Palliative Care   Reason for referral  Clarify Goals of Care   Date of Admission  09/21/2015   Date first seen by Palliative Care  09/19/15   # of days Palliative referral  response time  0 Day(s)   # of days IP prior to Palliative referral  1   Clinical Assessment    Palliative Performance Scale Score  30%   Pain Max last 24 hours  5   Pain Min Last 24 hours  4   Dyspnea Max Last 24 Hours  6   Dyspnea Min Last 24 hours  5   Psychosocial & Spiritual Assessment    Palliative Care Outcomes    Patient/Family meeting held?  Yes   Who was at the meeting?  patient    Palliative Care Outcomes  Clarified goals of care      Patient Active Problem List   Diagnosis Date Noted  . Pressure ulcer 09/19/2015  . Hypoxemia   . Asthma   . Acute pulmonary edema (HCC)   . PAH (pulmonary artery hypertension) ([place])   . Acute cor pulmonale (HCC)   . Acute on chronic diastolic heart failure ([place])   . Anasarca associated with disorder of kidney   . Encounter for palliative care   . Goals of care, counseling/discussion   . Cardiogenic shock ([place])   . Volume overload 09/12/2015  . Elevated troponin 09/23/2015  . Hypotension 09/04/2015  . Hypoxia 09/19/2015  . Leukocytosis 09/25/2015  . Anemia 09/12/2015  . ESRD on dialysis ([place]) 07/27/2015  . 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 Right hemiparesis ([place]) 11/25/2014  . Aphasia due to stroke 11/25/2014  . DNAR (do not attempt resuscitation) 11/20/2014  . Paroxysmal a-fib ([place]) 11/16/2014  . HLD (hyperlipidemia) 11/16/2014  . Essential hypertension   . PFO (patent foramen ovale)   . Chronic ischemic colitis ([place]) 09/25/2014  . Left hemiparesis ([place]) 09/11/2014  . History of ETT   . Aortic aneurysm ([place]) 08/13/2014  . Duodenal anastomotic leak   . Protein calorie malnutrition ([place])   . Right heart failure ([place])   . HIT (heparin-induced  thrombocytopenia) ([place]) 07/27/2014  . Fistula   . History of intermediate V/Q scan   . Other emphysema ([place])   . Abnormal TSH   . Enteritis due to Clostridium difficile 07/19/2014  . Perforated gastric ulcer ([place]) 07/01/2014  . Stroke ([place])   . Hyperlipidemia    . Cerebral thrombosis with cerebral infarction ([place]) 06/20/2014  . Hypertension 06/20/2014  . ESRD needing dialysis ([place]) 06/20/2014  . Obesity (BMI 30-39.9) 06/20/2014  . Depression 06/20/2014  . Left renal mass 06/20/2014  . Chronic diastolic heart failure ([place]) 06/20/2014  . Tobacco use disorder 06/20/2014    Palliative Care Assessment & Plan   Patient Profile:  Assessment: Cardiogenic shock Has R heart failure ESRD History of stroke   Recommendations/Plan: - Met again with her son and daughter.  We discussed again her clinical course to this point in time including the fact that she is not responding to current interventions including CRRT.  Her family understands the severity of her condition and, while appropriately upset, report that they understand that she is dying. - Plan for discontinuation of aggressive measures and changing focus of her care moving forward to comfort.  Her family would like for her sister-in-law to arrive and discuss further with Dr. Titus Mould prior to transition to full comfort care.  Prognosis:   < 2 weeks.  She has multiorgan involvement and has not been tolerating CRRT.  This was discontinued today.  I would anticipate her prognosis to be days to weeks at best, however she remains at high risk for acute decompensation and death at any point.   Discharge Planning:   Hospital Death appears likely.  If her condition were to stabilize enough to consider transition from the hospital, she would be a good candidate for residential hospice for end of life care.  This was not discussed with family today and will readdress if she appears stable enough to consider discharge in the next day or two.  Care plan was discussed with patient    Thank you for allowing the Palliative Medicine Team to assist in the care of this patient.   Time In: 1305 Time Out: 1345 Total Time 40 Prolonged Time Billed  no       Greater than 50%  of this time was spent counseling  and coordinating care related to the above assessment and plan.  Micheline Rough, MD [phone]  Please contact Palliative Medicine Team phone at [phone] for questions and concerns.

## 2015-09-22 NOTE — Progress Notes (Signed)
PULMONARY / CRITICAL CARE MEDICINE   Name: [name] MRN: [id number] DOB: December 20, 1955    ADMISSION DATE:  10/12/2015 CONSULTATION DATE:  Oct 12, 2015  REFERRING MD:  [facility]ad  CHIEF COMPLAINT: bilateral leg/hip pain  HISTORY OF PRESENT ILLNESS:   51F hx ESRD on HD, prior CVA with residual aphasia, asthma on pulmicort and duoNebs, hypothyroidism, paroxysmal atrial fibrillation/flutter on ASA, patent foramen ovale, HTN, chronic diastolic heart failure, likely pulmonary HTN with RVSP 86 in 11/2014 (dilated RV and RA at that time) presenting with shortness of breath, wheezing and bilateral hip/leg pain.  PCCM asked to see for cardiogenic shock  SUBJECTIVE:  No overnight events. Mildly hypotensive this AM. Palliative care discussion yesterday, family unwilling to withdraw care at this time despite patient's inability to come off CRRT and tolerate intermittent HD.   VITAL SIGNS: BP 92/60 mmHg  Pulse 95  Temp(Src) 97.7 F (36.5 C) (Oral)  Resp 22  Ht 5\' 7"  (1.702 m)  Wt 166 lb 14.2 oz (75.7 kg)  BMI 26.13 kg/m2  SpO2 98%   INTAKE / OUTPUT: I/O last 3 completed shifts: In: 850 [P.O.:400; I.V.:340; Other:10; IV Piggyback:100] Out: 2564 [Other:2564]  PHYSICAL EXAMINATION:  General Awake, alert, NAD.   HEENT PERRL, EOMI. Moist mucus membranes.   Pulmonary Scattered rhonchi, Poor effort, symmetrical expansion. Mild scattered wheezes.   Cardiovascular Tachy . Regular rhythm. S1, s2. No m/r/g. PMI displaced rightward with marked RV heave noted. Distal pulses palpable. LUE AVF with palpable thrill  Abdomen Soft, non-tender, non-distended, positive bowel sounds, no palpable organomegaly or masses. Ventral hernia easily reduced. Midline scar and prior PEG site scar. Normoresonant to percussion.  Musculoskeletal Normal tone, moves all extremities. Grossly normal.   Lymphatics No cervical, supraclavicular or axillary adenopathy.   Neurologic Expressive aphasia with some delay in receptive  comprehension. Follows some commands. Moves all extremities spontaneously.   Skin/Integuement No rash, no cyanosis, no clubbing. Tender to palpation over greater trochanter region bilaterally with marked induration and soft tissue swelling extending around to her sacrum. No drainage. Slightly warm to touch. No erythema noted.    LABS:  BMET  Recent Labs Lab 09/21/15 0500 09/21/15 1600 09/22/15 0400  NA 134* 135 138  K 4.4 4.4 4.5  CL 102 103 103  CO2 24 28 27   BUN 13 10 8   CREATININE 3.40* 2.87* 2.43*  GLUCOSE 83 98 74   Electrolytes  Recent Labs Lab 09/20/15 0450  09/21/15 0500 09/21/15 1600 09/22/15 0400  CALCIUM 8.7*  8.7*  < > 8.8* 8.8* 9.1  MG 1.9  --  2.1  --  2.2  PHOS 5.1*  < > 4.0 3.5 3.0  < > = values in this interval not displayed. CBC  Recent Labs Lab 09/20/15 0450 09/21/15 0500 09/22/15 0400  WBC 12.1* 9.6 8.6  HGB 10.1* 9.2* 9.0*  HCT 32.6* 30.2* 29.6*  PLT 168 120* 86*    Coag's  Recent Labs Lab Oct 12, 2015 1818 09/19/15 0049 09/20/15 0450 09/21/15 0500  APTT 40* 38* 38* 42*  INR 1.62* 1.56*  --   --    Sepsis Markers  Recent Labs Lab 2015/10/12 1818  09/19/15 0546 09/19/15 1622 09/19/15 2110  LATICACIDVEN  --   < > 1.8 1.7 0.9  PROCALCITON 7.77  --   --   --   --   < > = values in this interval not displayed.  ABG  Recent Labs Lab 2015-10-12 1735  PHART 7.341*  PCO2ART 49.2*  PO2ART 49.0*  Liver Enzymes  Recent Labs Lab 09/20/15 0450  09/21/15 0500 09/21/15 1600 09/22/15 0400  AST 43*  --  33  --  31  ALT 37  --  31  --  31  ALKPHOS 78  --  72  --  66  BILITOT 1.0  --  0.8  --  0.9  ALBUMIN 2.3*  2.2*  < > 2.3* 2.1* 2.2*  < > = values in this interval not displayed. Cardiac Enzymes  Recent Labs Lab 09/29/2015 2206 09/19/15 0049 09/19/15 0846  TROPONINI 0.60* 0.63* 0.68*   Glucose  Recent Labs Lab 09/20/15 0838 09/20/15 0840 09/21/15 0759 09/21/15 0914 09/21/15 0916 09/22/15 0736  GLUCAP 56* 88 62*  68 100* 87   Imaging No results found.   STUDIES:  CXR as above CT Angio Chest 6/17 >> Suggestive of Right Heart Failure, right pleural effusion, pulmonary nodule. NO PE.   CULTURES: Blood cx 6/17 >>  ANTIBIOTICS: Vancomycin 6/17 >> 6/20 Zosyn 6/17 >> 6/20  SIGNIFICANT EVENTS: 6/17 >> Admit 6/21 >> Off pressors  LINES/TUBES: RIJ HD cath 6/18 >>  DISCUSSION: [name] is a 62F with prior history of stroke with resultant aphasia, chronic hypoxemic respiratory failure on 2L O2 continuously, asthma on pulmicort and duonebs, RV dilation on TTE and known chronic diastolic dysfunction, ESRD on TTS HD, HTN, hypothyroidism and recently diagnosed pAfib/flutter on ASA. She presented with bilateral hip/leg pain that seems related to marked soft tissue swelling and induration in this region. Cellulitis considered. Her shortness of breath is reportedly at baseline and she is on her home O2 requirement. She is now in cardiogenic shock, currently on Levophed. Now DNR/DNI.   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 P:   Continue supplemental O2 Continue home asthma regimen (pulmicort + DuoNebs) Continue CRRT for now per family  CARDIOVASCULAR A:  Paroxysmal a fib/flutter on ASA RV dilation with severe TR and elevated RVSP on prior echo and CT findings consistent with RH failure (pHTN likely WHO group II) Cardiogenic shock; off pressors P:  Supportive care Volume removal w/ CRRT per renal  RENAL A:   ESRD on TTS HD P:   CRRT per renal  GASTROINTESTINAL A:   No acute issues P:   PPI  HEMATOLOGIC A:   Chronic anemia related to ESRD P:  Per renal  INFECTIOUS A:   Leukocytosis; resolved Thrombocytopenia Acute on Chronic Anemia; renal disease, chronic disease, acute illness P:   Follow cultures  ENDOCRINE A:   Hypothyroidism P:   Continue  Synthroid  NEUROLOGIC A:   Prior CVA with residual aphasia on ASA P:   Hold sedating meds    Lauris Chroman, MD PGY-3, Internal Medicine Pager: [phone]   STAFF NOTE: Cindi Carbon, MD FACP have personally reviewed patient's available data, including medical history, events of note, physical examination and test results as part of my evaluation. I have discussed with resident/NP and other care providers such as pharmacist, RN and RRT. In addition, I personally evaluated patient and elicited key findings of: awakens, No distress, coarse BS, pain reported, cvhd on going BUT not tolerating well, starting levophed would be futile and wont be offered, I will meet with family myself to discuss dc of cvvhd and comfort care, this care is futile and medically ineffective therapy, chem per renal on cvvhd, it appears that she is simply NOT tolerating cvvhd and we should dc  this, plat noted, not on heparin, volume related likely   Mcarthur RossettiDaniel J. Tyson AliasFeinstein, MD, FACP Pgr: [phone] [facility] 09/22/2015 9:44 AM

## 2015-09-22 NOTE — Progress Notes (Signed)
eLink Physician-Brief Progress Note Patient Name: [name] DOB: Nov 26, 1955 MRN: [id number]   Date of Service  09/22/2015  HPI/Events of Note  Patient is comfort measures. No pain medication ordered. Patient c/o pain.  eICU Interventions  Will order Morphine 2 mg IV Q 1 hour PRN pain.     Intervention Category Intermediate Interventions: Pain - evaluation and management  Sommer,Steven Dennard Nipugene 09/22/2015, 9:47 PM

## 2015-09-23 DIAGNOSIS — M79606 Pain in leg, unspecified: Secondary | ICD-10-CM

## 2015-09-23 LAB — CULTURE, BLOOD (ROUTINE X 2)
CULTURE: NO GROWTH
Culture: NO GROWTH

## 2015-09-23 MED ORDER — HALOPERIDOL LACTATE 2 MG/ML PO CONC
0.5000 mg | ORAL | Status: DC | PRN
  Filled 2015-09-23: qty 0.3

## 2015-09-23 MED ORDER — GLYCOPYRROLATE 0.2 MG/ML IJ SOLN
0.2000 mg | INTRAMUSCULAR | Status: DC | PRN
  Administered 2015-09-25: 0.2 mg via INTRAVENOUS
  Filled 2015-09-23 (×2): qty 1

## 2015-09-23 MED ORDER — GLYCOPYRROLATE 0.2 MG/ML IJ SOLN: 0.2000 mg | INTRAMUSCULAR | Status: DC | PRN

## 2015-09-23 MED ORDER — GLYCOPYRROLATE 1 MG PO TABS
1.0000 mg | ORAL_TABLET | ORAL | Status: DC | PRN
  Filled 2015-09-23: qty 1

## 2015-09-23 MED ORDER — LORAZEPAM 1 MG PO TABS: 1.0000 mg | ORAL_TABLET | ORAL | Status: DC | PRN

## 2015-09-23 MED ORDER — HALOPERIDOL LACTATE 5 MG/ML IJ SOLN: 0.5000 mg | INTRAMUSCULAR | Status: DC | PRN

## 2015-09-23 MED ORDER — HALOPERIDOL 1 MG PO TABS: 0.5000 mg | ORAL_TABLET | ORAL | Status: DC | PRN

## 2015-09-23 MED ORDER — LORAZEPAM 2 MG/ML PO CONC: 1.0000 mg | ORAL | Status: DC | PRN

## 2015-09-23 MED ORDER — BISACODYL 10 MG RE SUPP: 10.0000 mg | Freq: Every day | RECTAL | Status: DC | PRN

## 2015-09-23 MED ORDER — LORAZEPAM 2 MG/ML IJ SOLN
1.0000 mg | INTRAMUSCULAR | Status: DC | PRN
Start: 2015-09-23 — End: 2015-09-26
  Filled 2015-09-23: qty 1

## 2015-09-23 MED ORDER — HYDROMORPHONE HCL 1 MG/ML IJ SOLN
0.5000 mg | INTRAMUSCULAR | Status: DC | PRN
  Administered 2015-09-23 – 2015-09-26 (×4): 0.5 mg via INTRAVENOUS
  Filled 2015-09-23 (×4): qty 1

## 2015-09-23 MED ORDER — HYDROMORPHONE HCL 1 MG/ML IJ SOLN
0.5000 mg | INTRAMUSCULAR | Status: DC
  Administered 2015-09-23 (×2): 0.5 mg via INTRAVENOUS
  Filled 2015-09-23 (×2): qty 1

## 2015-09-23 NOTE — Progress Notes (Signed)
Daily Progress Note   Patient Name: [name]       Date: 09/23/2015 DOB: 1955-10-01  Age: 60 y.o. MRN#: [id number] Attending Physician: Rush Farmer, MD Primary Care Physician: Hoyt Koch, MD Admit Date: 09/17/2015  Reason for Consultation/Follow-up: Establishing goals of care   [name] is a 31F with prior history of stroke with resultant aphasia, chronic hypoxemic respiratory failure on 2L O2 continuously, asthma on pulmicort and duonebs, RV dilation on TTE and known chronic diastolic dysfunction, ESRD on TTS HD, HTN, hypothyroidism and recently diagnosed pAfib/flutter on ASA. She presented with bilateral hip/leg pain that seems related to marked soft tissue swelling and induration in this region. Cellulitis considered. Her shortness of breath is reportedly at baseline and she is on her home O2 requirement. She is now in cardiogenic shock and was trialed on CRRT without benefit.  Now comfort care.  Subjective: I met today with the patient in conjunction with Dr. Lorrene Reid  I also spoke with her daughter [name].  Ms. Fuson did not verbalize today, however she had grimace on her face throughout encounter.  She nodded yes when asked about pain and again when asked if she would like pain medication at this time.  Her daughter reports that she has been going through paperwork, including her mother's HCPOA paperwork, and wanted to review things with chaplain services.  I explained to her that, while I am sure our chaplains would be happy to discuss any matters that are concerning her and her mother,1 they will only be able to assist in completion of paperwork/notarization of medical documents.  She reports understanding this and would appreciate them stopping by to meet and talk with her  and her mother.  Length of Stay: 5  Current Medications: Scheduled Meds:  .  HYDROmorphone (DILAUDID) injection  0.5 mg Intravenous Q4H    Continuous Infusions:    PRN Meds: bisacodyl, glycopyrrolate **OR** glycopyrrolate **OR** glycopyrrolate, haloperidol **OR** haloperidol **OR** haloperidol lactate, HYDROmorphone (DILAUDID) injection, LORazepam **OR** LORazepam **OR** LORazepam  Physical Exam         Weak, chronically ill appearing, nonverbal, but does nod yes when asked about pain, particularly in hips/thighs Edema Catheter in place in neck  Diminished breath sounds S1 S2  Vital Signs: BP 75/50 mmHg  Pulse 93  Temp(Src) 99 F (  37.2 C) (Axillary)  Resp 27  Ht '5\' 7"'$  (1.702 m)  Wt 75.7 kg (166 lb 14.2 oz)  BMI 26.13 kg/m2  SpO2 92% SpO2: SpO2: 92 % O2 Device: O2 Device: Nasal Cannula O2 Flow Rate: O2 Flow Rate (L/min): 2 L/min  Intake/output summary:   Intake/Output Summary (Last 24 hours) at 09/23/15 0916 Last data filed at 09/22/15 1400  Gross per 24 hour  Intake  66.33 ml  Output    440 ml  Net -373.67 ml   LBM: Last BM Date: 09/22/15 Baseline Weight: Weight: 77 kg (169 lb 12.1 oz) Most recent weight: Weight: 75.7 kg (166 lb 14.2 oz)       Palliative Assessment/Data: 20%  Flowsheet Rows        Most Recent Value   Intake Tab    Referral Department  Critical care   Unit at Time of Referral  ICU   Palliative Care Primary Diagnosis  Cardiac   Date Notified  09/19/15   Palliative Care Type  Return patient Palliative Care   Reason for referral  Clarify Goals of Care   Date of Admission  09/27/2015   Date first seen by Palliative Care  09/19/15   # of days Palliative referral response time  0 Day(s)   # of days IP prior to Palliative referral  1   Clinical Assessment    Palliative Performance Scale Score  30%   Pain Max last 24 hours  5   Pain Min Last 24 hours  4   Dyspnea Max Last 24 Hours  6   Dyspnea Min Last 24 hours  5   Psychosocial & Spiritual  Assessment    Palliative Care Outcomes    Patient/Family meeting held?  Yes   Who was at the meeting?  patient    Palliative Care Outcomes  Clarified goals of care      Patient Active Problem List   Diagnosis Date Noted  . Pressure ulcer 09/19/2015  . Hypoxemia   . Asthma   . Acute pulmonary edema (HCC)   . PAH (pulmonary artery hypertension) ([place])   . Acute cor pulmonale (HCC)   . Acute on chronic diastolic heart failure ([place])   . Anasarca associated with disorder of kidney   . Encounter for palliative care   . Goals of care, counseling/discussion   . Cardiogenic shock ([place])   . Volume overload 09/25/2015  . Elevated troponin 09/19/2015  . Hypotension 09/25/2015  . Hypoxia 09/09/2015  . Leukocytosis 09/22/2015  . Anemia 09/24/2015  . ESRD on dialysis ([place]) 07/27/2015  . 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 Right hemiparesis ([place]) 11/25/2014  . Aphasia due to stroke 11/25/2014  . DNAR (do not attempt resuscitation) 11/20/2014  . Paroxysmal a-fib ([place]) 11/16/2014  . HLD (hyperlipidemia) 11/16/2014  . Essential hypertension   . PFO (patent foramen ovale)   . Chronic ischemic colitis ([place]) 09/25/2014  . Left hemiparesis ([place]) 09/11/2014  . History of ETT   . Aortic aneurysm ([place]) 08/13/2014  . Duodenal anastomotic leak   . Protein calorie malnutrition ([place])   . Right heart failure ([place])   . 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 Fistula   . History of intermediate V/Q scan   . Other emphysema ([place])   . Abnormal TSH   . Enteritis due to Clostridium difficile 07/19/2014  . Perforated gastric ulcer ([place]) 07/01/2014  . Stroke ([place])   . Hyperlipidemia   . Cerebral thrombosis with cerebral infarction (  [place]) 06/20/2014  . Hypertension 06/20/2014  . ESRD needing dialysis ([place]) 06/20/2014  . Obesity (BMI 30-39.9) 06/20/2014  . Depression 06/20/2014  . Left renal mass 06/20/2014  . Chronic diastolic heart failure ([place]) 06/20/2014  .  Tobacco use disorder 06/20/2014    Palliative Care Assessment & Plan   Patient Profile:  Assessment: Cardiogenic shock Has R heart failure ESRD History of stroke   Recommendations/Plan: - Patient is full comfort care.  - She appears uncomfortable to me on exam today and nod "yes" when asked about pain. I discussed with Dr. Lorrene Reid who was seeing patient at the same time as my encounter. - I adjusted her pain regimen today. This included rotation to Dilaudid from morphine.  Her prognosis, while likely limited to days at best, may be long enough that she would be at risk for buildup of metabolites from morphine with regular dosing (which may cause unwanted side effects).  I would be particularly concerned about hyperalgesia developing as she is already in significant pain even with light touch.  Her daughter reports needing to talk more with her mother today about her end-of-life affairs and was against continuous infusion of pain medication at this time as she is concerned about her mother becoming overly sedated. Therefore, will schedule Dilaudid 0.5 mg every 4 hours as I am concerned with her ability communicate when she is hurting.  Also, plan for additional rescue dosing of 0.5 mg every hour as needed for breakthrough pain. Discussed with bedside nurse and asked her to be aggressive in symptom management as ultimate goal moving forward is for patient's comfort. I would have a low threshold to initiate continuous infusion if needed to better control her symptoms. - Other comfort meds ordered via EOL order set.  - His daughter requested to speak with chaplaincy services. I paged on-call chaplain and he will arrange for someone to meet with family as soon as possible.  Prognosis:   < 2 weeks.  She has multiorgan involvement and is no longer getting renal replacement therapy.  I would anticipate her prognosis to be days at best, however she remains at high risk for acute decompensation and death  at any point.   Discharge Planning:   Hospital Death vs residential hospice.  If her condition were to stabilize enough to consider transition from the hospital, she would be a good candidate for residential hospice for end of life care.  I began initial discussion of this with her daughter today. Her daughter appears overwhelmed with the situation overall.  Plan to continue to work to establish patient's comfort as her pain is not well controlled at this point and will continue to assess her stability for transition to residential hospice as symptoms come under better control.  Care plan was discussed with patient    Thank you for allowing the Palliative Medicine Team to assist in the care of this patient.   Time In: 0840 Time Out: 0920 Total Time 40 Prolonged Time Billed  no       Greater than 50%  of this time was spent counseling and coordinating care related to the above assessment and plan.  Micheline Rough, MD [phone]  Please contact Palliative Medicine Team phone at [phone] for questions and concerns.

## 2015-09-23 NOTE — Care Management Important Message (Signed)
Important Message  Patient Details  Name: [name] MRN: [id number] Date of Birth: 1955-10-13   Medicare Important Message Given:  Yes    Kingsley PlanWile, Andree Golphin Marie, RN 09/23/2015, 10:37 AM

## 2015-09-23 NOTE — Progress Notes (Signed)
Nutrition Brief Note  Chart reviewed. Pt now transitioning to comfort care.  No further nutrition interventions warranted at this time.  Please re-consult as needed.   Katherinne Mofield A. Amisadai Woodford, RD, LDN, CDE Pager: [phone] After hours Pager: [phone]  

## 2015-09-23 NOTE — Progress Notes (Signed)
[facility] responded to a consult where the family requested an AD. I introduced myself as chaplain and delivered the materials for their review. The family will contact when the forms are filled out. I will follow up at that time.  Stephanie FearBenjamin T Kately [name] 9:52 AM    09/23/15 0900  Clinical Encounter Type  Visited With Patient and family together  Visit Type Initial;Spiritual support;Critical Care  Referral From Nurse  Spiritual Encounters  Spiritual Needs Prayer;Grief support  Stress Factors  Family Stress Factors None identified

## 2015-09-23 NOTE — Progress Notes (Signed)
PULMONARY / CRITICAL CARE MEDICINE   Name: [name] MRN: [id number] DOB: [birth year]    ADMISSION DATE:  09/12/2015 CONSULTATION DATE:  09/09/2015  REFERRING MD:  Triad  CHIEF COMPLAINT: bilateral leg/hip pain  HISTORY OF PRESENT ILLNESS:   48F hx ESRD on HD, prior CVA with residual aphasia, asthma on pulmicort and duoNebs, hypothyroidism, paroxysmal atrial fibrillation/flutter on ASA, patent foramen ovale, HTN, chronic diastolic heart failure, likely pulmonary HTN with RVSP 86 in 11/2014 (dilated RV and RA at that time) presenting with shortness of breath, wheezing and bilateral hip/leg pain.  PCCM asked to see for cardiogenic shock  SUBJECTIVE:  Comfort care, pt reported pain  VITAL SIGNS: BP 75/50 mmHg  Pulse 93  Temp(Src) 99 F (37.2 C) (Axillary)  Resp 27  Ht 5\' 7"  (1.702 m)  Wt 75.7 kg (166 lb 14.2 oz)  BMI 26.13 kg/m2  SpO2 92%   INTAKE / OUTPUT: I/O last 3 completed shifts: In: 396.3 [P.O.:240; I.V.:146.3; Other:10] Out: 1467 [Other:1467]  PHYSICAL EXAMINATION:  General: arousable, reported pain to family Neuro: moves upper ext, some pain distress HEENT: line clean PULM: coarse ronchi CV:  s1 s2 RRR murmur GI: soft, bs wnl low, no r Extremities:  edema                                 LABS:  BMET  Recent Labs Lab 09/21/15 0500 09/21/15 1600 09/22/15 0400  NA 134* 135 138  K 4.4 4.4 4.5  CL 102 103 103  CO2 24 28 27   BUN 13 10 8   CREATININE 3.40* 2.87* 2.43*  GLUCOSE 83 98 74   Electrolytes  Recent Labs Lab 09/20/15 0450  09/21/15 0500 09/21/15 1600 09/22/15 0400  CALCIUM 8.7*  8.7*  < > 8.8* 8.8* 9.1  MG 1.9  --  2.1  --  2.2  PHOS 5.1*  < > 4.0 3.5 3.0  < > = values in this interval not displayed. CBC  Recent Labs Lab 09/20/15 0450 09/21/15 0500 09/22/15 0400  WBC 12.1* 9.6 8.6  HGB 10.1* 9.2* 9.0*  HCT 32.6* 30.2* 29.6*  PLT 168 120* 86*    Coag's  Recent Labs Lab 09/17/2015 1818 09/19/15 0049  09/20/15 0450 09/21/15 0500  APTT 40* 38* 38* 42*  INR 1.62* 1.56*  --   --    Sepsis Markers  Recent Labs Lab 09/30/2015 1818  09/19/15 0546 09/19/15 1622 09/19/15 2110  LATICACIDVEN  --   < > 1.8 1.7 0.9  PROCALCITON 7.77  --   --   --   --   < > = values in this interval not displayed.  ABG  Recent Labs Lab 09/06/2015 1735  PHART 7.341*  PCO2ART 49.2*  PO2ART 49.0*   Liver Enzymes  Recent Labs Lab 09/20/15 0450  09/21/15 0500 09/21/15 1600 09/22/15 0400  AST 43*  --  33  --  31  ALT 37  --  31  --  31  ALKPHOS 78  --  72  --  66  BILITOT 1.0  --  0.8  --  0.9  ALBUMIN 2.3*  2.2*  < > 2.3* 2.1* 2.2*  < > = values in this interval not displayed. Cardiac Enzymes  Recent Labs Lab 09/25/2015 2206 09/19/15 0049 09/19/15 0846  TROPONINI 0.60* 0.63* 0.68*   Glucose  Recent Labs Lab 09/20/15 [id number] 09/20/15 0840 09/21/15 [id number] 09/21/15 [id number] 09/21/15 [id number] 09/22/15 [id number]  GLUCAP 56* 88 62* 68 100* 87   Imaging No results found.   STUDIES:  CXR as above CT Angio Chest 6/17 >> Suggestive of Right Heart Failure, right pleural effusion, pulmonary nodule. NO PE.   CULTURES: Blood cx 6/17 >>  ANTIBIOTICS: Vancomycin 6/17 >> 6/20 Zosyn 6/17 >> 6/20  SIGNIFICANT EVENTS: 6/17 >> Admit 6/21 >> Off pressors  LINES/TUBES: RIJ HD cath 6/18 >>  DISCUSSION: [name] is a 80F with prior history of stroke with resultant aphasia, chronic hypoxemic respiratory failure on 2L O2 continuously, asthma on pulmicort and duonebs, RV dilation on TTE and known chronic diastolic dysfunction, ESRD on TTS HD, HTN, hypothyroidism and recently diagnosed pAfib/flutter on ASA. She presented with bilateral hip/leg pain that seems related to marked soft tissue swelling and induration in this region. Cellulitis considered. Her shortness of breath is reportedly at baseline and she is on her home O2 requirement. She is now in cardiogenic shock, currently on Levophed. Now DNR/DNI.  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 If rr rising greater 18, will start drip and titrate to rr and pain resolution Will keep hd cath for access needed I updated daughter in full in room  To triad  Mcarthur RossettiDaniel J. Tyson AliasFeinstein, MD, FACP Pgr: [phone] [facility] 09/23/2015 10:40 AM

## 2015-09-23 NOTE — Progress Notes (Signed)
[facility] Rounding Note  Subjective/Interval Events  Patient was made comfort care yesterday and transferred to 6N She appears to me to be in pain - grimacing, indicates pain just with exam, esp hips Palliative Care here now also and will be adjusting her pain meds  Physical Exam BP 75/50 mmHg  Pulse 93  Temp(Src) 99 F (37.2 C) (Axillary)  Resp 27  Ht 5\' 7"  (1.702 m)  Wt 75.7 kg (166 lb 14.2 oz)  BMI 26.13 kg/m2  SpO2 92%  Very uncomfortable from upper leg/hip pain Grimacing Still follows commands R IJ temp cath in place (6/18)  + JVD Tachy, regular S1S2 cannot hear murmur Abd non-distended and not tender + ventral hernia 2+-3+ edema both LE's esp around hip area LUA AVF +bruit     Recent Labs Lab July 30, 2015 1454 09/19/15 0049 09/20/15 0450 09/20/15 1600 09/21/15 0500 09/21/15 1600 09/22/15 0400  NA 133* 132* 132*  133* 134* 134* 135 138  K 4.8 4.7 4.2  4.2 4.4 4.4 4.4 4.5  CL 93* 96* 98*  99* 101 102 103 103  CO2 26 24 25  25 26 24 28 27   GLUCOSE 76 114* 102*  101* 132* 83 98 74  BUN 17 21* 22*  21* 17 13 10 8   CREATININE 4.59* 5.22* 4.91*  4.77* 4.06* 3.40* 2.87* 2.43*  CALCIUM 9.4 9.1 8.7*  8.7* 8.7* 8.8* 8.8* 9.1  PHOS  --   --  5.1* 4.8* 4.0 3.5 3.0     Recent Labs Lab 09/20/15 0450  09/21/15 0500 09/21/15 1600 09/22/15 0400  AST 43*  --  33  --  31  ALT 37  --  31  --  31  ALKPHOS 78  --  72  --  66  BILITOT 1.0  --  0.8  --  0.9  PROT 6.6  --  6.4*  --  6.6  ALBUMIN 2.3*  2.2*  < > 2.3* 2.1* 2.2*  < > = values in this interval not displayed.  Recent Labs Lab 09/19/15 1318  AMMONIA 65*     Recent Labs Lab 09/16/15 1903 July 30, 2015 1620 09/19/15 0049 09/20/15 0450 09/21/15 0500 09/22/15 0400  WBC 10.5 14.9* 11.0* 12.1* 9.6 8.6  NEUTROABS 7.9* 12.4*  --   --   --   --   HGB 11.9* 10.2* 10.3* 10.1* 9.2* 9.0*  HCT 36.8 32.7* 32.6* 32.6* 30.2* 29.6*  MCV 84.8 84.3 84.7 84.5 88.3 87.3  PLT 214 194 166 168 120* 86*     Recent  Labs Lab July 30, 2015 1454 July 30, 2015 1620 July 30, 2015 2206 09/19/15 0049 09/19/15 0846  CKTOTAL  --  74  --   --   --   TROPONINI 0.83*  --  0.60* 0.63* 0.68*     Recent Labs Lab 09/20/15 0840 09/21/15 0759 09/21/15 0914 09/21/15 0916 09/22/15 0736  GLUCAP 88 62* 68 100* 87   Outpt Dialysis: SW TTS 4h 75.5kg 3K/ 2.25 bath Hep none LUA AVF Mircera 225 last 6/13    Background 60 y.o. 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 6/17 was started on midodrine for low BP's (new issue for her). Presented with painful upper legs/edema.  Patient sedentary at home, can do about 1/2 of ADL's, uses walker/has expressive aphasia d/t prior CVA. Daughter takes her to HD. In ED 6/18 CXR  negative, CT angio chest showed R HF w contrast reflux into IVC, no edema or PE. BNP high 4500. BP's low 80's requiring addition pressors. Asked to see for ESRD - too unstable for HD. R IJ temp cath placed and pt placed on CRRT (6/18).   Assessment/Plans:  1. Chronic dCHF/pulm HTN/, decomp RHF.CT angio no PE. CXR 6/20 with worsening bilat air space disease/small effusions. Intoleran of all forms dialysis including CRRT. Decision made for comfort care. Palliative Care is adjusting her medications for improved comfort.   2. ESRD - no further HD and I have notified her outpatient unit.    At this time renal will sign off.  Thanks to all for excellent care and to Palliative for current management.  Camille Balynthia Mykira Hofmeister, MD [facility] [phone] pager 09/23/2015, 8:48 AM

## 2015-09-24 DIAGNOSIS — M7989 Other specified soft tissue disorders: Secondary | ICD-10-CM | POA: Insufficient documentation

## 2015-09-24 DIAGNOSIS — M79606 Pain in leg, unspecified: Secondary | ICD-10-CM | POA: Insufficient documentation

## 2015-09-24 DIAGNOSIS — Z515 Encounter for palliative care: Secondary | ICD-10-CM | POA: Insufficient documentation

## 2015-09-24 MED ORDER — HYDROMORPHONE HCL 1 MG/ML IJ SOLN
0.5000 mg | Freq: Four times a day (QID) | INTRAMUSCULAR | Status: DC
  Administered 2015-09-25 (×2): 0.5 mg via INTRAVENOUS
  Filled 2015-09-24 (×2): qty 1

## 2015-09-24 MED ORDER — GLYCOPYRROLATE 1 MG PO TABS: 1.0000 mg | ORAL_TABLET | ORAL | Status: AC | PRN

## 2015-09-24 MED ORDER — LORAZEPAM 1 MG PO TABS: 1.0000 mg | ORAL_TABLET | ORAL | Status: AC | PRN

## 2015-09-24 MED ORDER — HALOPERIDOL 0.5 MG PO TABS: 0.5000 mg | ORAL_TABLET | ORAL | Status: AC | PRN

## 2015-09-24 MED ORDER — MORPHINE SULFATE (CONCENTRATE) 10 MG/0.5ML PO SOLN: 10.0000 mg | Freq: Four times a day (QID) | ORAL | Status: AC | PRN

## 2015-09-24 NOTE — Discharge Instructions (Signed)
Cardiogenic Shock Shock is a life-threatening condition. It is defined as failure to get blood, oxygen, and nutrients to vital organs. Cardiogenic shock is a type of shock that happens when the heart fails to pump blood effectively through the body. CAUSES The underlying cause of cardiogenic shock is heart failure. It may happen suddenly (acute) or gradually over time as multiple traumas or attacks damage the heart. Common acute causes of cardiogenic shock include:  Heart attack.  Built-up fluid around the heart that accumulates quickly.  Trauma to the heart.  Heart valve problems.  Certain medicines, including medicines that are toxic to the heart.  A tumor in the heart.  Infection of the heart muscle.  A blood clot that has traveled to a lung (pulmonary embolism).  Abnormal heart rhythm.  Viral infection of the heart. Pump failure and cardiogenic shock can develop over time in patients with:  High blood pressure (hypertension).  Coronary artery disease.  Exposure to medicines that are poisonous (toxic) to the heart.  Recurrent pulmonary embolism.  Genetic defects (abnormalities) in the heart muscle. SIGNS AND SYMPTOMS  Signs and symptoms of cardiogenic shock include:  Low blood pressure (hypotension).  Sweating.  Urinating less often than normal.  Nausea.  Fainting.  Weakness.  Pale skin.  Cold hands or feet.  Shallow, quick breathing.  Confusion. DIAGNOSIS  Your health care provider will diagnose cardiogenic shock by performing a physical exam and taking your medical history. Other tests that may be performed include:  A check of your blood pressure, pulse, breathing rate, and temperature.  Blood tests.  Electrocardiogram (ECG).  Chest X-ray.  Echocardiography.  Cardiac catheterization. TREATMENT  Cardiogenic shock is a life-threatening condition that requires immediate medical attention. Treatment is aimed at getting blood flowing through  your body again. Treatment may include:  Medicine.  Oxygen given through a mask or a tube.  Use of a machine to help you breathe.  Use of a machine to help your heart pump.   This information is not intended to replace advice given to you by your health care provider. Make sure you discuss any questions you have with your health care provider.   Document Released: 12/28/2007 Document Revised: 04/10/2014 Document Reviewed: 10/27/2013 Elsevier Interactive Patient Education [employer]2016 Elsevier Inc.

## 2015-09-24 NOTE — Progress Notes (Signed)
Daily Progress Note   Patient Name: [name]       Date: 09/24/2015 DOB: 20-May-1955  Age: 60 y.o. MRN#: [id number] Attending Physician: Theodis Blaze, MD Primary Care Physician: Hoyt Koch, MD Admit Date: 09/07/2015  Reason for Consultation/Follow-up: Establishing goals of care   [name] is a 37F with prior history of stroke with resultant aphasia, chronic hypoxemic respiratory failure on 2L O2 continuously, asthma on pulmicort and duonebs, RV dilation on TTE and known chronic diastolic dysfunction, ESRD on TTS HD, HTN, hypothyroidism and recently diagnosed pAfib/flutter on ASA. She presented with bilateral hip/leg pain that seems related to marked soft tissue swelling and induration in this region. Cellulitis considered. Her shortness of breath is reportedly at baseline and she is on her home O2 requirement. She is now in cardiogenic shock and was trialed on CRRT without benefit.  Now comfort care.  Subjective: I met today with the patient and her daughter [name].  Ms. Dancer did not verbalize today, however she appeared much more relaxed and comfortable.  Discussed with her daughter regarding her symptoms and she reports that her mother had 2 episodes of pain that improved with bolus dosing.  We also touched again on transition to residential hospice.  Her daughter seems somewhat reluctant to this idea, however she was open to talking more with social work about options.    Length of Stay: 6  Current Medications: Scheduled Meds:  .  HYDROmorphone (DILAUDID) injection  0.5 mg Intravenous Q6H    Continuous Infusions:    PRN Meds: bisacodyl, glycopyrrolate **OR** glycopyrrolate **OR** glycopyrrolate, haloperidol **OR** haloperidol **OR** haloperidol lactate,  HYDROmorphone (DILAUDID) injection, LORazepam **OR** LORazepam **OR** LORazepam  Physical Exam         Weak, chronically ill appearing, nonverbal, opens eyes during exam Edema Catheter in place in neck  Diminished breath sounds S1 S2  Vital Signs: BP 103/71 mmHg  Pulse 95  Temp(Src) 98.8 F (37.1 C) (Oral)  Resp 19  Ht '5\' 7"'$  (1.702 m)  Wt 75.7 kg (166 lb 14.2 oz)  BMI 26.13 kg/m2  SpO2 92% SpO2: SpO2: 92 % O2 Device: O2 Device: Nasal Cannula O2 Flow Rate: O2 Flow Rate (L/min): 5 L/min  Intake/output summary:   Intake/Output Summary (Last 24 hours) at 09/24/15 0933 Last data filed at 09/24/15 0400  Gross per 24 hour  Intake     30 ml  Output      0 ml  Net     30 ml   LBM: Last BM Date: 09/22/15 Baseline Weight: Weight: 77 kg (169 lb 12.1 oz) Most recent weight: Weight: 75.7 kg (166 lb 14.2 oz)       Palliative Assessment/Data: 20%  Flowsheet Rows        Most Recent Value   Intake Tab    Referral Department  Critical care   Unit at Time of Referral  ICU   Palliative Care Primary Diagnosis  Cardiac   Date Notified  09/19/15   Palliative Care Type  Return patient Palliative Care   Reason for referral  Clarify Goals of Care   Date of Admission  09/16/2015   Date first seen by Palliative Care  09/19/15   # of days Palliative referral response time  0 Day(s)   # of days IP prior to Palliative referral  1   Clinical Assessment    Palliative Performance Scale Score  30%   Pain Max last 24 hours  5   Pain Min Last 24 hours  4   Dyspnea Max Last 24 Hours  6   Dyspnea Min Last 24 hours  5   Psychosocial & Spiritual Assessment    Palliative Care Outcomes    Patient/Family meeting held?  Yes   Who was at the meeting?  patient    Palliative Care Outcomes  Clarified goals of care      Patient Active Problem List   Diagnosis Date Noted  . Pressure ulcer 09/19/2015  . Hypoxemia   . Asthma   . Acute pulmonary edema (HCC)   . PAH (pulmonary artery hypertension)  ([place])   . Acute cor pulmonale (HCC)   . Acute on chronic diastolic heart failure ([place])   . Anasarca associated with disorder of kidney   . Encounter for palliative care   . Goals of care, counseling/discussion   . Cardiogenic shock ([place])   . Volume overload 09/17/2015  . Elevated troponin 09/25/2015  . Hypotension 09/16/2015  . Hypoxia 09/28/2015  . Leukocytosis 09/15/2015  . Anemia 09/21/2015  . ESRD on dialysis ([place]) 07/27/2015  . 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 Right hemiparesis ([place]) 11/25/2014  . Aphasia due to stroke 11/25/2014  . DNAR (do not attempt resuscitation) 11/20/2014  . Paroxysmal a-fib ([place]) 11/16/2014  . HLD (hyperlipidemia) 11/16/2014  . Essential hypertension   . PFO (patent foramen ovale)   . Chronic ischemic colitis ([place]) 09/25/2014  . Left hemiparesis ([place]) 09/11/2014  . History of ETT   . Aortic aneurysm ([place]) 08/13/2014  . Duodenal anastomotic leak   . Protein calorie malnutrition ([place])   . Right heart failure ([place])   . HIT (heparin-induced thrombocytopenia) ([place]) 07/27/2014  . Fistula   . History of intermediate V/Q scan   . Other emphysema ([place])   . Abnormal TSH   . Enteritis due to Clostridium difficile 07/19/2014  . Perforated gastric ulcer ([place]) 07/01/2014  . Stroke ([place])   . Hyperlipidemia   . Cerebral thrombosis with cerebral infarction ([place]) 06/20/2014  . Hypertension 06/20/2014  . ESRD needing dialysis ([place]) 06/20/2014  . Obesity (BMI 30-39.9) 06/20/2014  . Depression 06/20/2014  . Left renal mass 06/20/2014  . Chronic diastolic heart failure ([place]) 06/20/2014  . Tobacco use disorder 06/20/2014    Palliative Care Assessment & Plan   Patient Profile:  Assessment:  Cardiogenic shock Has R heart failure ESRD History of stroke   Recommendations/Plan: - Patient is full comfort care.  - Pain appears better controlled today.  Plan for dilaudid 0.'5mg'$  Q6 hours with additional rescue dosing of 0.5 mg every hour as  needed for breakthrough pain.  I would have a low threshold to initiate continuous infusion if needed to better control her symptoms and this may be needed in the near future. - Other comfort meds ordered via EOL order set.  - D/w social work who will follow-up with family to present options for residential hospice.  Prognosis:   < 2 weeks.  She has multiorgan involvement and is no longer getting renal replacement therapy.  I would anticipate her prognosis to be days at best, however she remains at high risk for acute decompensation and death at any point.   Discharge Planning:   She would be a good candidate for residential hospice.  I think that she will continue to escalate in her care needs and would not be well served in environment other than residential hospice or hospital.   Her daughter remains overwhelmed with the situation overall and still does not fully engage in conversation regarding residential hospice placement.  She is open to SW coming to discuss options and I have talked with her SW who will f/u with family.  Care plan was discussed with patient    Thank you for allowing the Palliative Medicine Team to assist in the care of this patient.   Time In: 0855 Time Out: 0935 Total Time 40 Prolonged Time Billed  no       Greater than 50%  of this time was spent counseling and coordinating care related to the above assessment and plan.  Micheline Rough, MD [phone]  Please contact Palliative Medicine Team phone at [phone] for questions and concerns.

## 2015-09-24 NOTE — Progress Notes (Signed)
No diet order for patient and family requesting regular diet. Family states they have been given patient apple sauce and she does not have swallowing difficulties. MD paged at family request for regular diet order.

## 2015-09-24 NOTE — Clinical Social Work Note (Signed)
CSW met with patient's daughter who was at bedside.  CSW explained to patient about Hospice and what she can expect at facility.  Patient's daughter was reluctant to talk about it, and said she wants to speak with the other family members first to make decision.  Patient's daughter stated she will call CSW once a decision has been made.  CSW to continue to follow patient's progress throughout discharge planning.   R. , MSW, LCSWA [phone] 09/24/2015 12:31 PM  

## 2015-09-24 NOTE — Discharge Summary (Addendum)
Physician Discharge Summary  [name] ZOX:[id number] DOB: Jun 28, 1955 DOA: Sep 23, 2015  PCP: Myrlene Broker, MD  Admit date: 23-Sep-2015 Discharge date: 09/24/2015  Recommendations for Outpatient Follow-up:  1. Pt will be discharged to residential hospice once bed available   Discharge Diagnoses:  Principal Problem:   Hypotension Active Problems:   Hypertension   ESRD needing dialysis (HCC)   Depression   Chronic diastolic heart failure (HCC)   Tobacco use disorder   Hyperlipidemia   Stroke (HCC)   Protein calorie malnutrition (HCC)   Paroxysmal a-fib (HCC)   HLD (hyperlipidemia)   Left middle cerebral artery stroke (HCC)   Aphasia due to stroke   ESRD on dialysis (HCC)   Volume overload   Elevated troponin   Hypoxia   Leukocytosis   Anemia   Pressure ulcer   Hypoxemia   Asthma   Acute pulmonary edema (HCC)   PAH (pulmonary artery hypertension) (HCC)   Acute cor pulmonale (HCC)   Acute on chronic diastolic heart failure (HCC)   Anasarca associated with disorder of kidney   Encounter for palliative care   Goals of care, counseling/discussion   Cardiogenic shock (HCC)  Discharge Condition: Stable  Diet recommendation: Heart healthy diet discussed in details   History of present illness:  25F hx ESRD on HD, prior CVA with residual aphasia, asthma on pulmicort and duoNebs, hypothyroidism, paroxysmal atrial fibrillation/flutter on ASA, patent foramen ovale, HTN, chronic diastolic heart failure, likely pulmonary HTN with RVSP 86 in 11/2014 (dilated RV and RA at that time) presenting with shortness of breath, wheezing and bilateral hip/leg pain.  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 - CT angio no PE. CXR 6/20 with worsening bilat air space disease/small effusions. Intoleran of all forms dialysis including CRRT.  - Decision made for comfort care.  - PCT following    Pressure ulcer, Present on Admission - sacral area, stage II, partial thickness  ESRD  - no further HD and I have notified her outpatient unit.   DVT prophylaxis: full comfort  Code Status: DNR Family Communication: Family at bedside  Disposition Plan: residential hospice   Consultants:   PCT  Antimicrobials:   Vancomycin 6/17 >> 6/20  Zosyn 6/17 >> 6/20  Procedures/Studies: Dg Chest 2 View  2015-09-23  CLINICAL DATA:  Shortness of breath, chest pain. EXAM: CHEST  2 VIEW COMPARISON:  Radiographs of [place] 12, 2017. FINDINGS: Stable cardiomegaly. Minimal bilateral pleural effusions are noted. No pneumothorax is noted. No acute pulmonary disease is noted. Bony thorax is unremarkable. IMPRESSION: Stable cardiomegaly. Minimal bilateral pleural effusions. No other abnormality seen in the chest. Electronically Signed   By: Lupita Raider, M.D.   On: 23-Sep-2015 15:55   Dg Chest 2 View  09/13/2015  CLINICAL DATA:  LEFT buttock pain, COPD, hypertension, stroke, former smoker EXAM: CHEST  2 VIEW COMPARISON:  05/23/2015 FINDINGS: Enlargement of cardiac silhouette. Atherosclerotic calcification and mild elongation of thoracic aorta. Prominent central pulmonary arteries raising question of pulmonary arterial hypertension. Lungs clear. No acute infiltrate, pleural effusion or pneumothorax. Bones demineralized. IMPRESSION: Enlargement of cardiac silhouette with enlarged central pulmonary arteries raising question of pulmonary arterial hypertension. No acute infiltrate.  Electronically Signed   By: Ulyses SouthwardMark  Boles M.D.   On: 09/13/2015 15:23   Ct Angio Chest Pe W Or Wo Contrast  02-20-2016  CLINICAL DATA:  Hypoxia.  Shortness of breath  and chest pain. EXAM: CT ANGIOGRAPHY CHEST WITH CONTRAST TECHNIQUE: Multidetector CT imaging of the chest was performed using the standard protocol during bolus administration of intravenous contrast. Multiplanar CT image reconstructions and MIPs were obtained to evaluate the vascular anatomy. CONTRAST:  100 cc Isovue 370 IV COMPARISON:  Chest radiograph earlier this day. Most recent CT 07/29/2014 FINDINGS: There are no filling defects within the pulmonary arteries to suggest pulmonary embolus. Prominent main pulmonary artery measuring 3.5 cm, with prominent right and left pulmonary arteries. Upper limits normal ascending thoracic aorta measuring 3.5 cm. Moderate aortic atherosclerosis. Tortuosity is seen distally. No gross aortic dissection, phase of contrast limits assessment. Cardiomegaly with primarily right heart dilatation. Contrast refluxing into the hepatic veins and IVC consistent with right heart failure. Contrast refluxes into the azygos system. Coronary artery calcifications are seen. No pericardial effusion. Small right pleural effusion. Minimal fluid in the left interlobar fissure. No pulmonary edema. Within the left lower lobe there is a 5 x 5 mm nodule image 67 series 7. This was not seen on previous exam. There is mild emphysema. No confluent airspace disease. Other than venous reflux, no acute abnormality in the included upper abdomen. There are no acute or suspicious osseous abnormalities. Review of the MIP images confirms the above findings. IMPRESSION: 1. No pulmonary embolus. 2. Findings consistent with right heart failure. Right heart dilatation with large volume contrast refluxing into the hepatic veins, IVC, and azygos system. Small right pleural effusion. 3. Left lower lobe 5 mm pulmonary nodule. No follow-up needed if patient is low-risk. Non-contrast chest CT can be considered in 12 months if patient is high-risk. This recommendation follows the consensus statement: Guidelines for  Management of Incidental Pulmonary Nodules Detected on CT Images:From the Fleischner Society 2017; published online before print (10.1148/[id number]). Electronically Signed   By: Rubye OaksMelanie  Ehinger M.D.   On: 011-19-2017 20:02   Dg Chest Port 1 View  09/21/2015  CLINICAL DATA:  Cardiogenic shock.  Atrial fibrillation. EXAM: PORTABLE CHEST 1 VIEW COMPARISON:  Single-view of the chest 09/19/2015 and 011-19-2017. FINDINGS: Right IJ approach dialysis catheter is seen as on the most recent study. There is cardiomegaly and bibasilar airspace disease. Likely bilateral pleural effusions are noted. Aeration appears worsened compared to yesterday's study. No pneumothorax. IMPRESSION: Increased small to moderate bilateral pleural effusions and basilar airspace disease since yesterday's examination. Cardiomegaly. Electronically Signed   By: Drusilla Kannerhomas  Dalessio M.D.   On: 09/21/2015 07:13   Dg Chest Port 1 View  09/19/2015  CLINICAL DATA:  Hemodialysis catheter placement. EXAM: PORTABLE CHEST 1 VIEW COMPARISON:  Chest radiograph [place] 17, 2017 FINDINGS: Cardiac silhouette is mildly enlarged unchanged. Tortuous mildly calcified aortic. Fullness of pulmonary hila better characterized on yesterday's CT chest. No pleural effusion or focal consolidation. Interval placement of RIGHT internal jugular central venous catheter with distal tip projecting in proximal superior vena cava. No pneumothorax. Soft tissue planes included osseous structure in unchanged. IMPRESSION: New RIGHT internal jugular central venous catheter projects in proximal superior vena cava. No pneumothorax. Stable cardiomegaly. Electronically Signed   By: Awilda Metroourtnay  Bloomer M.D.   On: 09/19/2015 19:06    Discharge Exam: Filed Vitals:   09/23/15 2031 09/24/15 0512  BP: 100/66 103/71  Pulse: 87 95  Temp: 98.2 F (36.8 C) 98.8 F (37.1  C)  Resp: 19 19   Filed Vitals:   09/22/15 2126 09/23/15 1636 09/23/15 2031 09/24/15 0512  BP: 75/50 102/72 100/66  103/71  Pulse: 93 100 87 95  Temp: 99 F (37.2 C) 98.2 F (36.8 C) 98.2 F (36.8 C) 98.8 F (37.1 C)  TempSrc: Axillary Axillary Oral Oral  Resp:  22 19 19   Height:      Weight:      SpO2: 92% 100% 100% 92%    General: Pt is somnolent, NAD Cardiovascular: Regular rate and rhythm, S1/S2 +,  no rubs, no gallops Respiratory: diminished breath sounds at bases with poor inspiratory effort   Discharge Instructions     Medication List    STOP taking these medications        albuterol (2.5 MG/3ML) 0.083% nebulizer solution  Commonly known as:  PROVENTIL     albuterol 108 (90 Base) MCG/ACT inhaler  Commonly known as:  PROVENTIL HFA;VENTOLIN HFA     ALPRAZolam 0.5 MG tablet  Commonly known as:  XANAX     aspirin 325 MG tablet     calcium acetate 667 MG capsule  Commonly known as:  PHOSLO     gabapentin 100 MG capsule  Commonly known as:  NEURONTIN     HYDROcodone-acetaminophen 5-325 MG tablet  Commonly known as:  NORCO/VICODIN     ipratropium-albuterol 0.5-2.5 (3) MG/3ML Soln  Commonly known as:  DUONEB     levETIRAcetam 100 MG/ML solution  Commonly known as:  KEPPRA     levothyroxine 25 MCG tablet  Commonly known as:  SYNTHROID, LEVOTHROID     loperamide 2 MG tablet  Commonly known as:  IMODIUM A-D     metoprolol succinate 25 MG 24 hr tablet  Commonly known as:  TOPROL-XL     mirtazapine 15 MG tablet  Commonly known as:  REMERON     multivitamin Tabs tablet     pantoprazole 40 MG tablet  Commonly known as:  PROTONIX     zaleplon 5 MG capsule  Commonly known as:  SONATA      TAKE these medications        glycopyrrolate 1 MG tablet  Commonly known as:  ROBINUL  Take 1 tablet (1 mg total) by mouth every 4 (four) hours as needed (excessive secretions).     haloperidol 0.5 MG tablet  Commonly known as:  HALDOL  Take 1 tablet (0.5 mg total) by mouth every 4 (four) hours as needed for agitation (or delirium).     LORazepam 1 MG tablet  Commonly  known as:  ATIVAN  Take 1 tablet (1 mg total) by mouth every 4 (four) hours as needed for anxiety.     morphine CONCENTRATE 10 MG/0.5ML Soln concentrated solution  Take 0.5 mLs (10 mg total) by mouth every 6 (six) hours as needed for severe pain or shortness of breath.          The results of significant diagnostics from this hospitalization (including imaging, microbiology, ancillary and laboratory) are listed below for reference.     Microbiology: Recent Results (from the past 240 hour(s))  Culture, blood (Routine X 2) w Reflex to ID Panel     Status: None   Collection Time: 09/03/2015  2:54 PM  Result Value Ref Range Status   Specimen Description BLOOD RIGHT HAND  Final   Special Requests IN PEDIATRIC BOTTLE 4CC  Final   Culture NO GROWTH 5 DAYS  Final   Report Status 09/23/2015 FINAL  Final  Culture, blood (Routine X 2) w Reflex to ID Panel     Status: None   Collection Time: 10/01/2015  6:04 PM  Result Value Ref Range Status   Specimen Description BLOOD RIGHT ARM  Final   Special Requests BOTTLES DRAWN AEROBIC AND ANAEROBIC 5CC  Final   Culture NO GROWTH 5 DAYS  Final   Report Status 09/23/2015 FINAL  Final  MRSA PCR Screening     Status: None   Collection Time: 09/25/2015  9:25 PM  Result Value Ref Range Status   MRSA by PCR NEGATIVE NEGATIVE Final    Comment:        The GeneXpert MRSA Assay (FDA approved for NASAL specimens only), is one component of a comprehensive MRSA colonization surveillance program. It is not intended to diagnose MRSA infection nor to guide or monitor treatment for MRSA infections.      Labs: Basic Metabolic Panel:  Recent Labs Lab 09/03/2015 2206  09/20/15 0450 09/20/15 1600 09/21/15 0500 09/21/15 1600 09/22/15 0400  NA  --   < > 132*  133* 134* 134* 135 138  K  --   < > 4.2  4.2 4.4 4.4 4.4 4.5  CL  --   < > 98*  99* 101 102 103 103  CO2  --   < > 25  25 26 24 28 27   GLUCOSE  --   < > 102*  101* 132* 83 98 74  BUN  --   < >  22*  21* 17 13 10 8   CREATININE  --   < > 4.91*  4.77* 4.06* 3.40* 2.87* 2.43*  CALCIUM  --   < > 8.7*  8.7* 8.7* 8.8* 8.8* 9.1  MG 1.6*  --  1.9  --  2.1  --  2.2  PHOS  --   --  5.1* 4.8* 4.0 3.5 3.0  < > = values in this interval not displayed. Liver Function Tests:  Recent Labs Lab 09/20/2015 1454 09/19/15 0049 09/20/15 0450 09/20/15 1600 09/21/15 0500 09/21/15 1600 09/22/15 0400  AST 119* 65* 43*  --  33  --  31  ALT 60* 43 37  --  31  --  31  ALKPHOS 103 78 78  --  72  --  66  BILITOT 0.9 0.8 1.0  --  0.8  --  0.9  PROT 8.3* 6.5 6.6  --  6.4*  --  6.6  ALBUMIN 3.1* 2.3* 2.3*  2.2* 2.3* 2.3* 2.1* 2.2*   No results for input(s): LIPASE, AMYLASE in the last 168 hours.  Recent Labs Lab 09/19/15 1318  AMMONIA 65*   CBC:  Recent Labs Lab 09/27/2015 1620 09/19/15 0049 09/20/15 0450 09/21/15 0500 09/22/15 0400  WBC 14.9* 11.0* 12.1* 9.6 8.6  NEUTROABS 12.4*  --   --   --   --   HGB 10.2* 10.3* 10.1* 9.2* 9.0*  HCT 32.7* 32.6* 32.6* 30.2* 29.6*  MCV 84.3 84.7 84.5 88.3 87.3  PLT 194 166 168 120* 86*   Cardiac Enzymes:  Recent Labs Lab 09/23/2015 1454 09/20/2015 1620 09/13/2015 2206 09/19/15 0049 09/19/15 0846  CKTOTAL  --  74  --   --   --   TROPONINI 0.83*  --  0.60* 0.63* 0.68*   BNP: BNP (last 3 results)  Recent Labs  09/06/2015 1454 09/19/15 0546  BNP >4500.0* 4125.6*    ProBNP (last 3 results) No results for input(s): PROBNP in the last 8760 hours.  CBG:  Recent Labs Lab 09/20/15 0840 09/21/15 0759 09/21/15 0914 09/21/15 0916 09/22/15 0736  GLUCAP 88 62* 68 100* 87   SIGNED: Time coordinating discharge: 30 minutes  MAGICK-Gracie Gupta, MD  [facility] 09/24/2015, 8:39 AM Pager [phone]  If 7PM-7AM, please contact night-coverage [email or web address] Password [email or web address]

## 2015-09-24 NOTE — Progress Notes (Signed)
Patient has appeared comfortable throughout day. Family noticing this and stating that they do not want her to get the scheduled pain medication and that "they will call out to nurse if patient is in pain and needs medication". RN has been assessing pain through shift using PAINAD tool and scores have been zero.

## 2015-09-25 NOTE — Progress Notes (Signed)
Pt seen and examined at bedside, appears comfortable,s till with significant secretions. Family prefers to keep pt in hospital and will discuss with pt can be transfer under hospice team.   Debbora PrestoMAGICK-Salma Walrond, MD  [facility] Pager [phone]  If 7PM-7AM, please contact night-coverage [email or web address] Password [email or web address]

## 2015-10-02 NOTE — Progress Notes (Signed)
Respirations ceased, no heart tones noted. Notified Gertha CalkinJena RN, Press photographercharge nurse. Daughter at bedside.

## 2015-10-02 DEATH — deceased

## 2015-10-06 ENCOUNTER — Ambulatory Visit: Payer: Medicare Other | Admitting: Cardiovascular Disease

## 2015-10-14 ENCOUNTER — Telehealth: Payer: Self-pay | Admitting: Internal Medicine

## 2015-10-14 NOTE — Telephone Encounter (Signed)
Dr. Okey Duprerawford has filled out forms for the dates that she has seen the patient. These forms have been sent numerous times. They are asking for dates outside of when Dr. Okey Duprerawford saw patient. We have informed the patient and the company.

## 2015-10-14 NOTE — Telephone Encounter (Signed)
Following up on form faxed over on 7/11 to be completed and faxed back.  This was 6th request.  Would like to know what she can do to help get this form back.

## 2016-04-20 IMAGING — DX DG ABDOMEN 2V
3 series · 3 of 3 positions shown · non-contrast
Comparison: No similar prior exam is available at this institution
for comparison or on [HOSPITAL] PACS.

CLINICAL DATA: Abdominal pain

EXAM:
ABDOMEN - 2 VIEW

[abdomen supine]
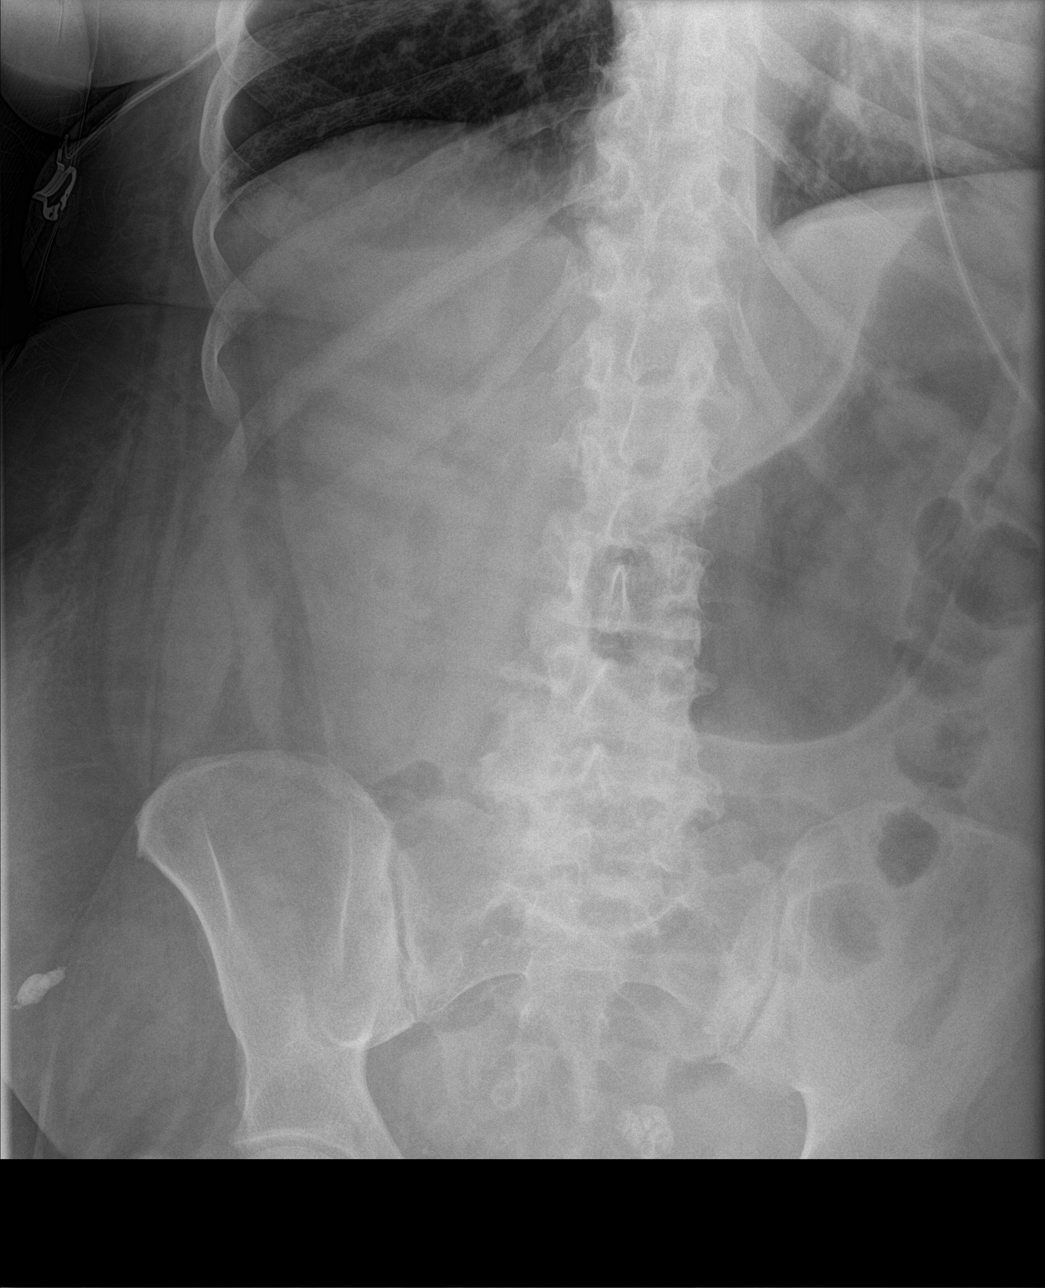

[abdomen decu (1 of 2)]
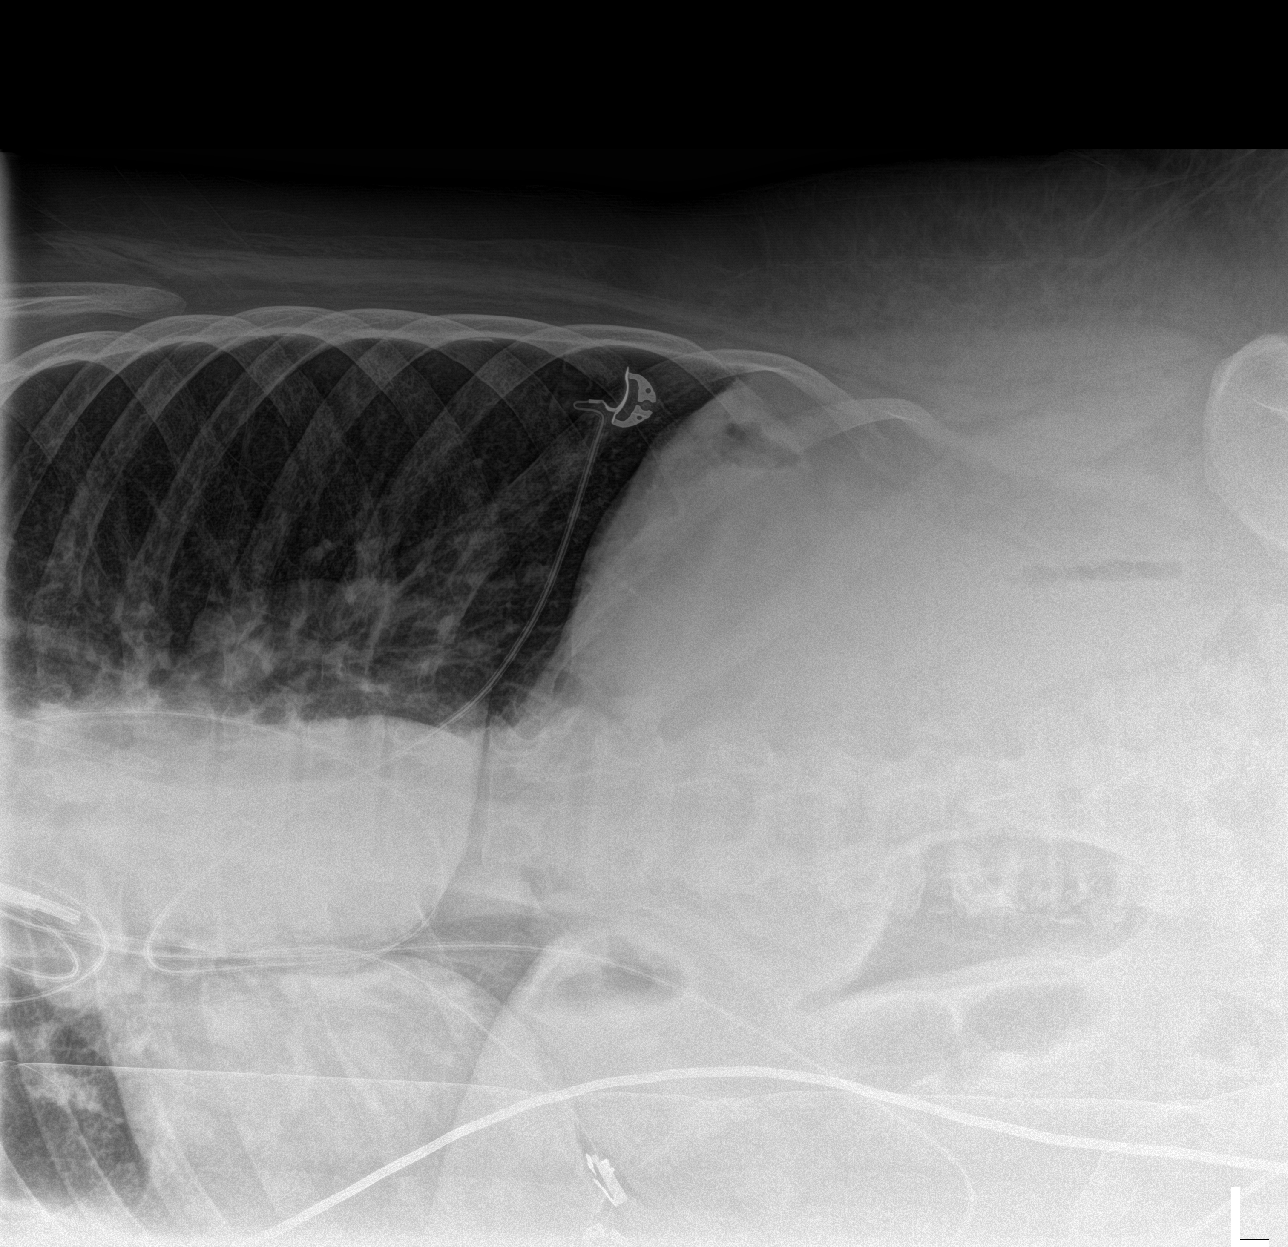

[abdomen decu (2 of 2)]
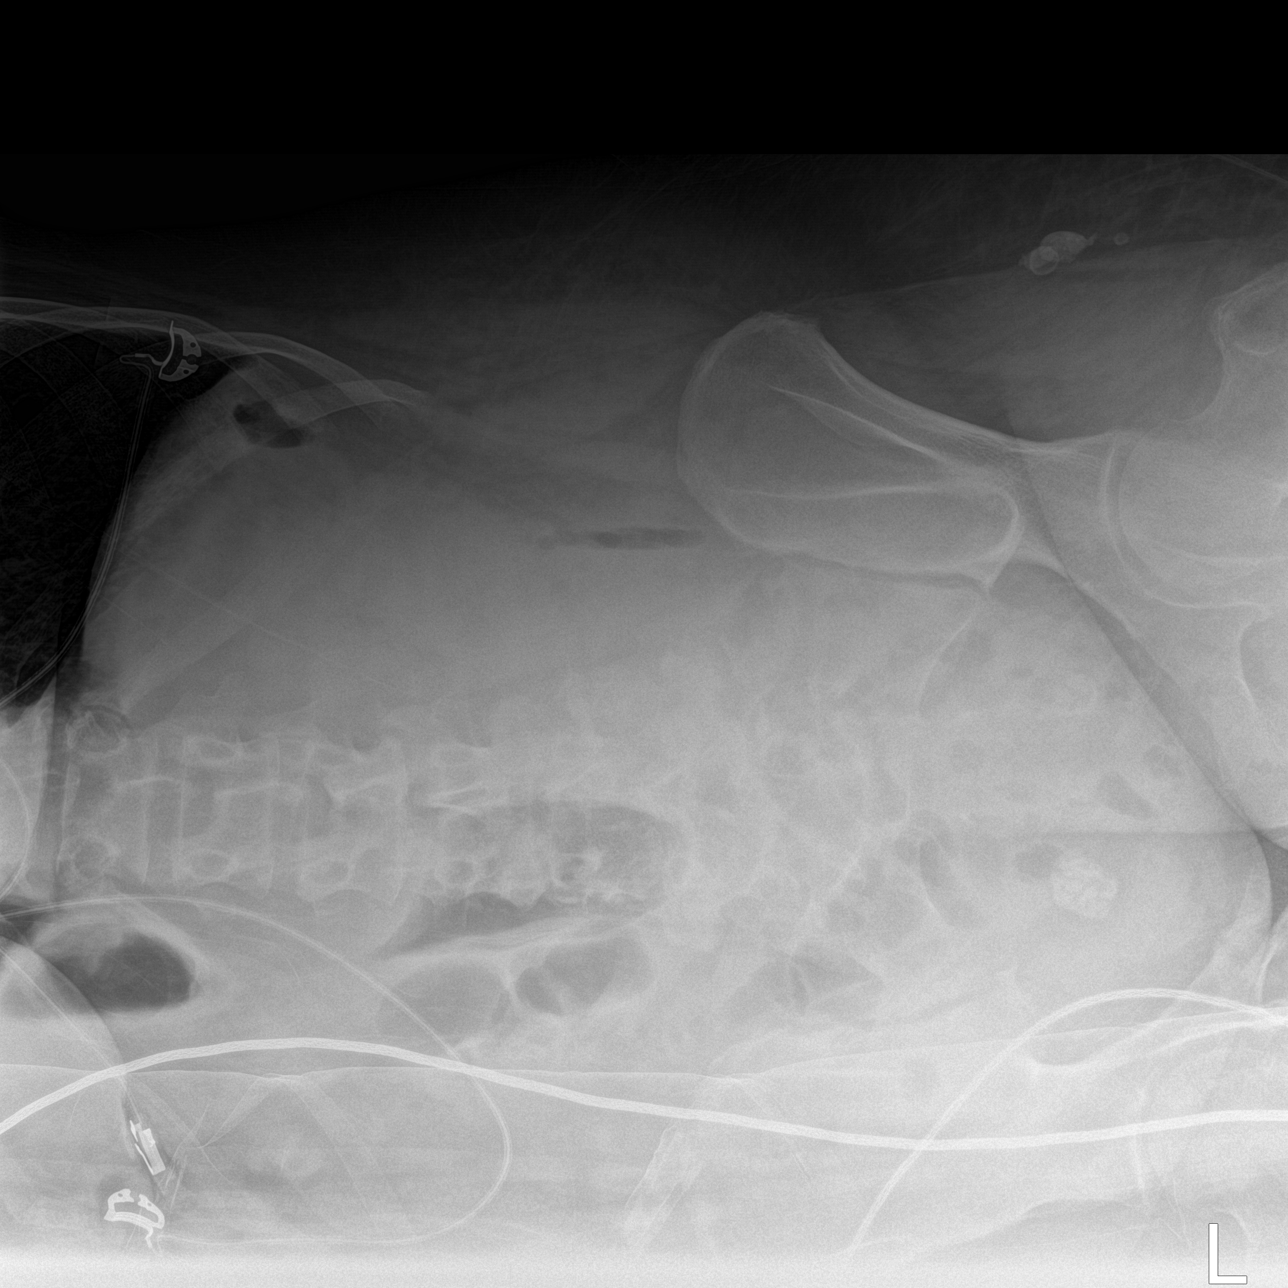

[3 of 3 positions shown; findings below may reference images not displayed]

FINDINGS: The bowel gas pattern is normal. There is no evidence of free air.
No radio-opaque calculi or other significant radiographic
abnormality is seen. Gaseous gastric distention is noted. Coarse
calcification over the pelvis likely indicates a fibroid.
IMPRESSION: Negative.

## 2016-04-23 IMAGING — CR DG CHEST 1V PORT
1 series · 1 of 1 positions shown · non-contrast
Comparison: 06/29/2014

CLINICAL DATA: Dyspnea.

EXAM:
PORTABLE CHEST - 1 VIEW

[AP]
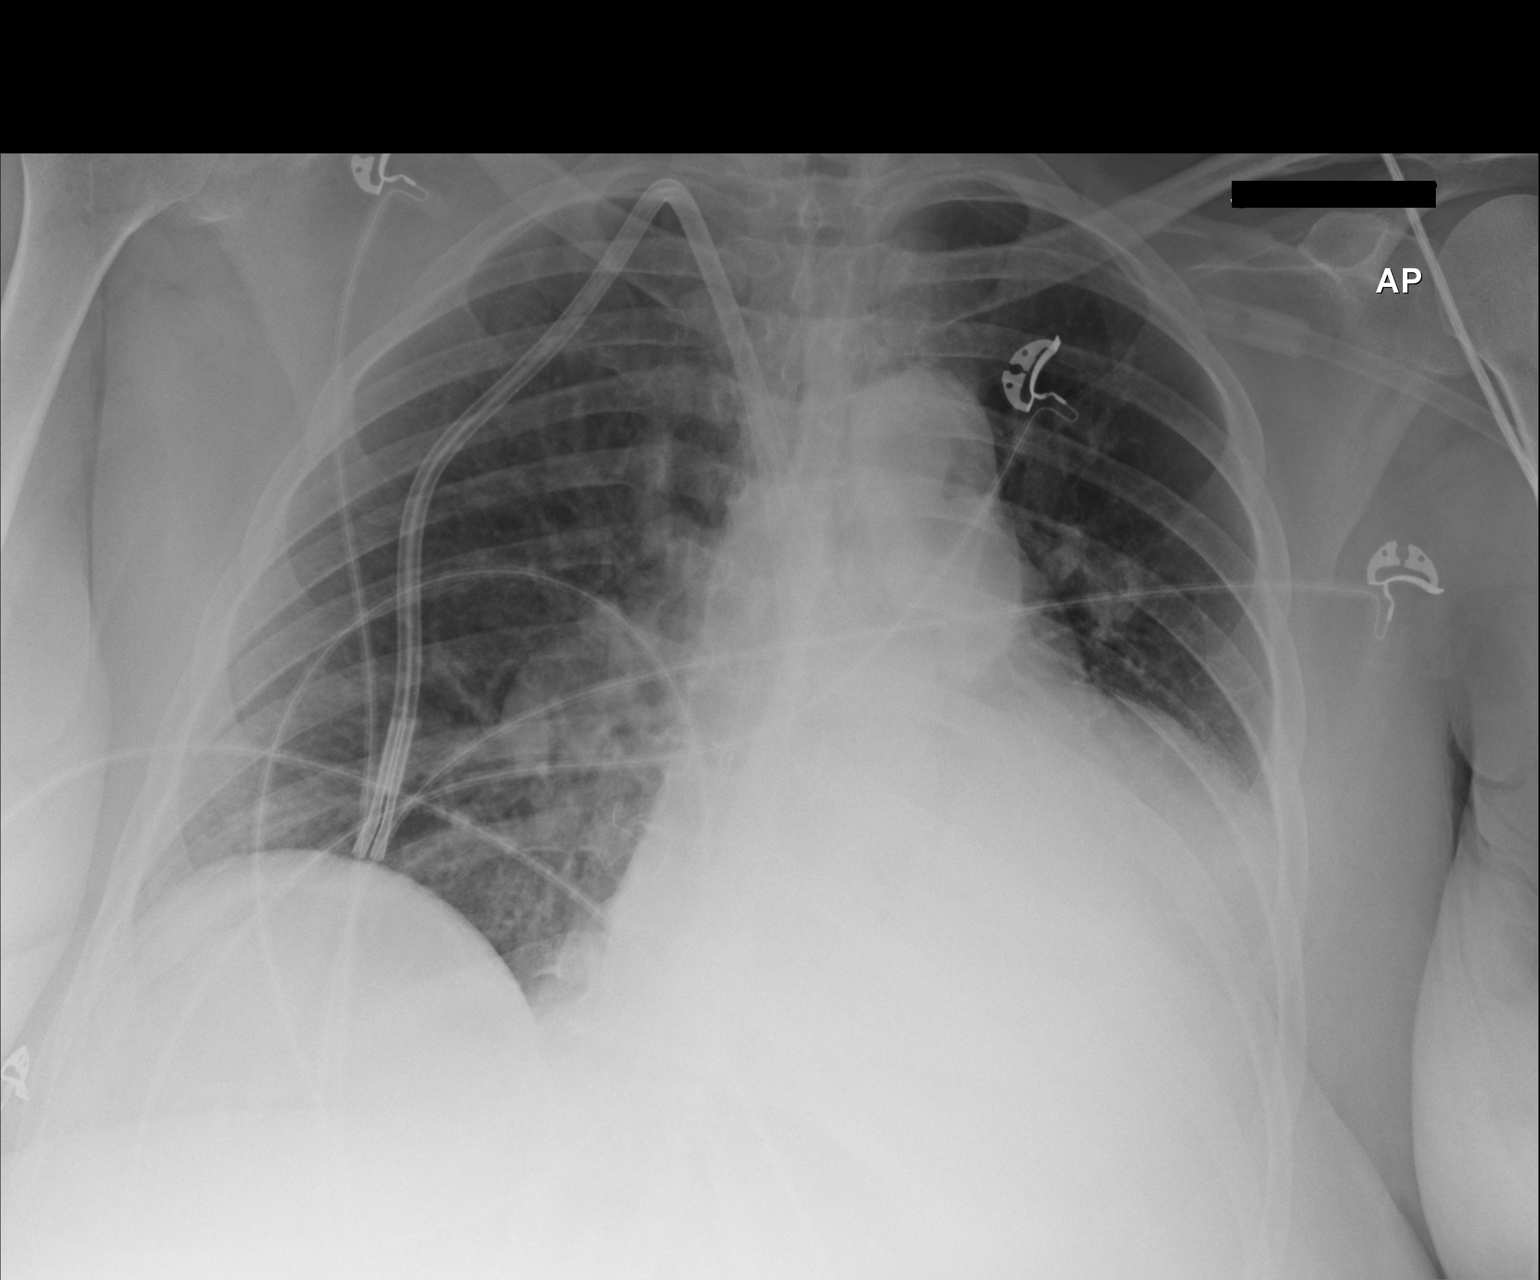

[1 of 1 positions shown; findings below may reference images not displayed]

FINDINGS: Right jugular dual-lumen catheter in the SVC unchanged. No
pneumothorax.

Left lower lobe consolidation with volume loss unchanged. This is
most consistent with collapse in the left lower lobe. Possible small
left effusion. Right lower lobe atelectasis unchanged. Negative for
edema.
IMPRESSION: Left lower lobe collapse remains unchanged. Mild right lower lobe
atelectasis. Negative for edema.

## 2016-04-24 IMAGING — CR DG CHEST 1V PORT
1 series · 1 of 1 positions shown · non-contrast
Comparison: None.

CLINICAL DATA: Atelectasis.

EXAM:
PORTABLE CHEST - 1 VIEW

[AP]
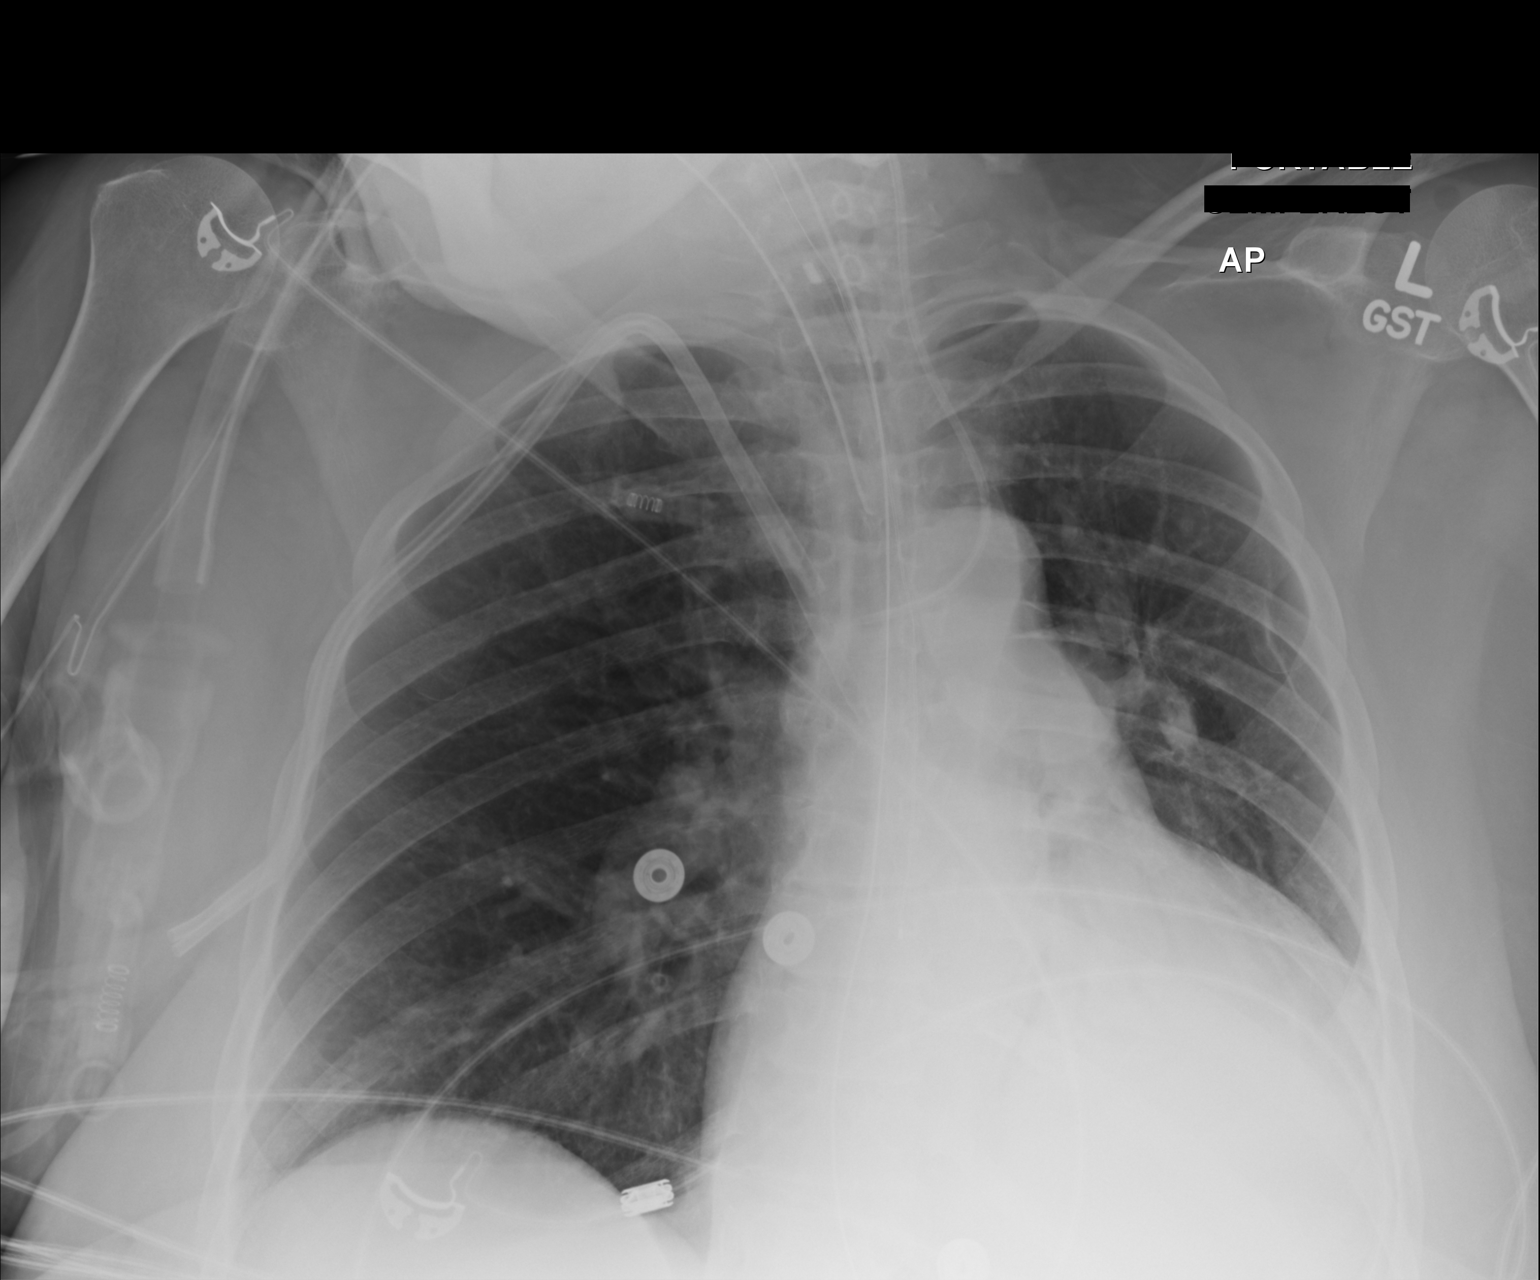

[1 of 1 positions shown; findings below may reference images not displayed]

FINDINGS: Interim intubation with endotracheal tube tip 5.2 cm above the
carina. Interim placement of NG tube, its tip is projected below
left hemidiaphragm. Right dialysis catheter, left IJ catheter in
stable position.Stable cardiomegaly. No pulmonary venous congestion.
Persistent left lower lobe atelectasis and infiltrate with possible
small left pleural effusion. No pneumothorax.
IMPRESSION: 1. Interim intubation. Its tip is 5.2 cm above the carina. Interim
NG tube placement. Its tip is below the left hemidiaphragm.
2. Persistent left lower lobe atelectasis and infiltrate. Small left
pleural effusion cannot be excluded.
3. Stable cardiomegaly.
